# Patient Record
Sex: Female | Born: 1935 | Race: White | Hispanic: No | Marital: Married | Smoking: Never smoker
Health system: Southern US, Community
[De-identification: ages and names within clinical notes are randomized; demographics above are authoritative.]

## PROBLEM LIST (undated history)

## (undated) DIAGNOSIS — M1990 Unspecified osteoarthritis, unspecified site: Secondary | ICD-10-CM

## (undated) DIAGNOSIS — Z95828 Presence of other vascular implants and grafts: Secondary | ICD-10-CM

## (undated) DIAGNOSIS — I10 Essential (primary) hypertension: Secondary | ICD-10-CM

## (undated) DIAGNOSIS — G8250 Quadriplegia, unspecified: Secondary | ICD-10-CM

## (undated) DIAGNOSIS — E43 Unspecified severe protein-calorie malnutrition: Secondary | ICD-10-CM

## (undated) DIAGNOSIS — S14126A Central cord syndrome at C6 level of cervical spinal cord, initial encounter: Secondary | ICD-10-CM

## (undated) DIAGNOSIS — D62 Acute posthemorrhagic anemia: Secondary | ICD-10-CM

## (undated) DIAGNOSIS — C801 Malignant (primary) neoplasm, unspecified: Secondary | ICD-10-CM

## (undated) HISTORY — DX: Unspecified severe protein-calorie malnutrition: E43

## (undated) HISTORY — DX: Central cord syndrome at C6 level of cervical spinal cord, initial encounter: S14.126A

## (undated) HISTORY — PX: CATARACT EXTRACTION: SUR2

## (undated) HISTORY — DX: Acute posthemorrhagic anemia: D62

## (undated) HISTORY — DX: Quadriplegia, unspecified: G82.50

## (undated) HISTORY — DX: Presence of other vascular implants and grafts: Z95.828

---

## 2012-09-12 ENCOUNTER — Ambulatory Visit: Payer: Self-pay | Admitting: Ophthalmology

## 2012-10-17 ENCOUNTER — Ambulatory Visit: Payer: Self-pay | Admitting: Ophthalmology

## 2013-12-31 DIAGNOSIS — I10 Essential (primary) hypertension: Secondary | ICD-10-CM

## 2014-02-04 ENCOUNTER — Ambulatory Visit: Payer: Self-pay | Admitting: Internal Medicine

## 2014-04-08 DIAGNOSIS — C4492 Squamous cell carcinoma of skin, unspecified: Secondary | ICD-10-CM

## 2014-04-08 HISTORY — DX: Squamous cell carcinoma of skin, unspecified: C44.92

## 2015-02-13 ENCOUNTER — Other Ambulatory Visit: Payer: Self-pay | Admitting: Internal Medicine

## 2015-02-13 DIAGNOSIS — Z1239 Encounter for other screening for malignant neoplasm of breast: Secondary | ICD-10-CM

## 2015-02-24 ENCOUNTER — Ambulatory Visit: Payer: Self-pay

## 2015-05-06 ENCOUNTER — Ambulatory Visit: Payer: Self-pay

## 2015-06-02 ENCOUNTER — Ambulatory Visit: Payer: Self-pay

## 2015-06-18 ENCOUNTER — Ambulatory Visit
Admission: RE | Admit: 2015-06-18 | Discharge: 2015-06-18 | Disposition: A | Discharge status: Home / Self Care | Payer: Managed Care, Other (non HMO) | Source: Ambulatory Visit | Attending: Internal Medicine | Admitting: Internal Medicine

## 2015-06-18 DIAGNOSIS — Z1239 Encounter for other screening for malignant neoplasm of breast: Secondary | ICD-10-CM

## 2015-06-18 DIAGNOSIS — Z1231 Encounter for screening mammogram for malignant neoplasm of breast: Secondary | ICD-10-CM | POA: Insufficient documentation

## 2015-06-18 HISTORY — DX: Malignant (primary) neoplasm, unspecified: C80.1

## 2015-07-02 ENCOUNTER — Encounter: Payer: Self-pay | Admitting: Emergency Medicine

## 2015-07-02 ENCOUNTER — Emergency Department
Admission: EM | Admit: 2015-07-02 | Discharge: 2015-07-02 | Disposition: A | Discharge status: Home / Self Care | Payer: Managed Care, Other (non HMO) | Attending: Emergency Medicine | Admitting: Emergency Medicine

## 2015-07-02 DIAGNOSIS — T783XXA Angioneurotic edema, initial encounter: Secondary | ICD-10-CM | POA: Diagnosis not present

## 2015-07-02 DIAGNOSIS — I10 Essential (primary) hypertension: Secondary | ICD-10-CM | POA: Insufficient documentation

## 2015-07-02 DIAGNOSIS — Z85828 Personal history of other malignant neoplasm of skin: Secondary | ICD-10-CM | POA: Diagnosis not present

## 2015-07-02 DIAGNOSIS — R220 Localized swelling, mass and lump, head: Secondary | ICD-10-CM | POA: Diagnosis present

## 2015-07-02 HISTORY — DX: Essential (primary) hypertension: I10

## 2015-07-02 MED ORDER — PREDNISONE 20 MG PO TABS
40.0000 mg | ORAL_TABLET | Freq: Once | ORAL | Status: AC
  Administered 2015-07-02: 40 mg via ORAL
  Filled 2015-07-02: qty 2

## 2015-07-02 MED ORDER — DIPHENHYDRAMINE HCL 25 MG PO CAPS
25.0000 mg | ORAL_CAPSULE | Freq: Once | ORAL | Status: AC
  Administered 2015-07-02: 25 mg via ORAL
  Filled 2015-07-02: qty 1

## 2015-07-02 MED ORDER — PREDNISONE 20 MG PO TABS
40.0000 mg | ORAL_TABLET | Freq: Every day | ORAL | Status: DC
Start: 2015-07-02 — End: 2018-01-19

## 2015-07-02 NOTE — ED Notes (Signed)
Pt states she noticed she was talking funny this afternoon and looked in the mirror and the left side of her tongue was swelling. Pt denies SOB or difficulty swallowing. Pt denies eating or taking any new medication.

## 2015-07-02 NOTE — Discharge Instructions (Signed)
Please discontinue use of your lisinopril/hydrochlorothiazide. Please follow-up with your doctor soon as possible to discuss further blood pressure management. Return to the emergency department for any worsening swelling, any trouble swallowing, or any trouble breathing.   Angioedema Angioedema is a sudden swelling of tissues, often of the skin. It can occur on the face or genitals or in the abdomen or other body parts. The swelling usually develops over a short period and gets better in 24 to 48 hours. It often begins during the night and is found when the person wakes up. The person may also get red, itchy patches of skin (hives). Angioedema can be dangerous if it involves swelling of the air passages.  Depending on the cause, episodes of angioedema may only happen once, come back in unpredictable patterns, or repeat for several years and then gradually fade away.  CAUSES  Angioedema can be caused by an allergic reaction to various triggers. It can also result from nonallergic causes, including reactions to drugs, immune system disorders, viral infections, or an abnormal gene that is passed to you from your parents (hereditary). For some people with angioedema, the cause is unknown.  Some things that can trigger angioedema include:   Foods.   Medicines, such as ACE inhibitors, ARBs, nonsteroidal anti-inflammatory agents, or estrogen.   Latex.   Animal saliva.   Insect stings.   Dyes used in X-rays.   Mild injury.   Dental work.  Surgery.  Stress.   Sudden changes in temperature.   Exercise. SIGNS AND SYMPTOMS   Swelling of the skin.  Hives. If these are present, there is also intense itching.  Redness in the affected area.   Pain in the affected area.  Swollen lips or tongue.  Breathing problems. This may happen if the air passages swell.  Wheezing. If internal organs are involved, there may be:   Nausea.   Abdominal pain.   Vomiting.    Difficulty swallowing.   Difficulty passing urine. DIAGNOSIS   Your health care provider will examine the affected area and take a medical and family history.  Various tests may be done to help determine the cause. Tests may include:  Allergy skin tests to see if the problem is an allergic reaction.   Blood tests to check for hereditary angioedema.   Tests to check for underlying diseases that could cause the condition.   A review of your medicines, including over-the-counter medicines, may be done. TREATMENT  Treatment will depend on the cause of the angioedema. Possible treatments include:   Removal of anything that triggered the condition (such as stopping certain medicines).   Medicines to treat symptoms or prevent attacks. Medicines given may include:   Antihistamines.   Epinephrine injection.   Steroids.   Hospitalization may be required for severe attacks. If the air passages are affected, it can be an emergency. Tubes may need to be placed to keep the airway open. HOME CARE INSTRUCTIONS   Take all medicines as directed by your health care provider.  If you were given medicines for emergency allergy treatment, always carry them with you.  Wear a medical bracelet as directed by your health care provider.   Avoid known triggers. SEEK MEDICAL CARE IF:   You have repeat attacks of angioedema.   Your attacks are more frequent or more severe despite preventive measures.   You have hereditary angioedema and are considering having children. It is important to discuss with your health care provider the risks of passing the  condition on to your children. SEEK IMMEDIATE MEDICAL CARE IF:   You have severe swelling of the mouth, tongue, or lips.  You have difficulty breathing.   You have difficulty swallowing.   You faint. MAKE SURE YOU:  Understand these instructions.  Will watch your condition.  Will get help right away if you are not doing  well or get worse.   This information is not intended to replace advice given to you by your health care provider. Make sure you discuss any questions you have with your health care provider.   Document Released: 03/29/2001 Document Revised: 02/08/2014 Document Reviewed: 09/11/2012 Elsevier Interactive Patient Education [employer].

## 2015-07-02 NOTE — ED Provider Notes (Signed)
[facility] Emergency Department Provider Note  Time seen: 7:36 PM  I have reviewed the triage vital signs and the nursing notes.   HISTORY  Chief Complaint Oral Swelling    HPI Sherry [name] is a 81 y.o. female with a past medical history of hypertension, presents the emergency department with swelling of the left side of her tongue. According to the patient she noticed this around 4 PM. Denies any itching or rash. Denies any trouble breathing or swallowing. Denies any history of the same. The patient does take lisinopril/hydrochlorothiazide and has been on this pill for approximately 1 year. Patient states the swelling appears to be improving.     Past Medical History  Diagnosis Date  . Cancer ([place])     skin  . Hypertension     There are no active problems to display for this patient.   Past Surgical History  Procedure Laterality Date  . Cataract extraction      No current outpatient prescriptions on file.  Allergies Review of patient's allergies indicates no known allergies.  Family History  Problem Relation Age of Onset  . Breast cancer Maternal Aunt 75    Social History Social History  Substance Use Topics  . Smoking status: Never Smoker   . Smokeless tobacco: None  . Alcohol Use: No    Review of Systems Constitutional: Negative for fever. ENT: Left tongue swelling Cardiovascular: Negative for chest pain. Respiratory: Negative for shortness of breath. Gastrointestinal: Negative for abdominal pain Musculoskeletal: Negative for back pain. Neurological: Negative for headache 10-point ROS otherwise negative.  ____________________________________________   PHYSICAL EXAM:  VITAL SIGNS: ED Triage Vitals  Enc Vitals Group     BP 07/02/15 1815 164/92 mmHg     Pulse Rate 07/02/15 1815 83     Resp 07/02/15 1815 18     Temp 07/02/15 1815 97.9 F (36.6 C)     Temp Source 07/02/15 1815 Oral     SpO2 07/02/15 1815 98 %   Weight 07/02/15 1815 170 lb (77.111 kg)     Height 07/02/15 1815 5\' 8"  (1.727 m)     Head Cir --      Peak Flow --      Pain Score --      Pain Loc --      Pain Edu? --      Excl. in [place]? --     Constitutional: Alert and oriented. Well appearing and in no distress. Eyes: Normal exam ENT   Head: Normocephalic and atraumatic.   Mouth/Throat: Mucous membranes are moist.Patient doesn't mild left-sided tongue edema. No lip edema. Pharynx is normal. No airway occlusion. No stridor. Cardiovascular: Normal rate, regular rhythm. No murmur Respiratory: Normal respiratory effort without tachypnea nor retractions. Breath sounds are clear  Gastrointestinal: Soft and nontender. No distention.  Musculoskeletal: Nontender with normal range of motion in all extremities.  Neurologic:  Normal speech and language. No gross focal neurologic deficits Skin:  Skin is warm, dry and intact.  Psychiatric: Mood and affect are normal.   ____________________________________________    INITIAL IMPRESSION / ASSESSMENT AND PLAN / ED COURSE  Pertinent labs & imaging results that were available during my care of the patient were reviewed by me and considered in my medical decision making (see chart for details).  Patient presents with mild left tongue swelling since 4 PM today. Exam very consistent with angioedema likely related to lisinopril. Patient believes her symptoms are improving. We'll dose oral Benadryl and prednisone  and continue to monitor in the emergency department. If symptoms improve we'll discharge home with prednisone. I discussed with the patient the need to discontinue use of lisinopril and see her primary care doctor this week. Patient agreeable plan.  Patient's symptoms appears largely unchanged, the patient feels like they have improved, but they've definite not worsened. We'll discharge the patient with prednisone, primary care follow-up. I have again reiterated that the patient needs to  discontinue use of lisinopril, she is agreeable.  ____________________________________________   FINAL CLINICAL IMPRESSION(S) / ED DIAGNOSES  Angioedema   Harvest Dark, MD 07/02/15 2029

## 2015-12-23 ENCOUNTER — Encounter: Payer: Self-pay | Admitting: Emergency Medicine

## 2015-12-23 ENCOUNTER — Emergency Department
Admission: EM | Admit: 2015-12-23 | Discharge: 2015-12-23 | Disposition: A | Discharge status: Home / Self Care | Payer: Managed Care, Other (non HMO) | Attending: Emergency Medicine | Admitting: Emergency Medicine

## 2015-12-23 DIAGNOSIS — T7840XA Allergy, unspecified, initial encounter: Secondary | ICD-10-CM | POA: Diagnosis not present

## 2015-12-23 DIAGNOSIS — I10 Essential (primary) hypertension: Secondary | ICD-10-CM | POA: Diagnosis not present

## 2015-12-23 DIAGNOSIS — Z85828 Personal history of other malignant neoplasm of skin: Secondary | ICD-10-CM | POA: Insufficient documentation

## 2015-12-23 DIAGNOSIS — R220 Localized swelling, mass and lump, head: Secondary | ICD-10-CM | POA: Diagnosis present

## 2015-12-23 MED ORDER — DIPHENHYDRAMINE HCL 50 MG/ML IJ SOLN
50.0000 mg | Freq: Once | INTRAMUSCULAR | Status: AC
  Administered 2015-12-23: 50 mg via INTRAMUSCULAR
  Filled 2015-12-23: qty 1

## 2015-12-23 NOTE — Discharge Instructions (Signed)
Please return immediately if condition worsens. Please contact her primary physician or the physician you were given for referral. If you have any specialist physicians involved in her treatment and plan please also contact them. Thank you for using [facility].  Please take Benadryl 50 mg every 6 hours. Return emergency department especially for increased facial swelling, trouble with speech, shortness of breath, etc.

## 2015-12-23 NOTE — ED Provider Notes (Signed)
Time Seen: Approximately 1347 I have reviewed the triage notes  Chief Complaint: Allergic Reaction   History of Present Illness: Sherry [name] is a 81 y.o. female *who presents with some right-sided lower facial swelling. Patient states she's had a history of focal allergic reactions which were thought to be secondary to ACE inhibitor blood pressure pill. She states her she had some tongue swelling and again she was taken off the medication and then she developed some other facial swelling which seemed to resolve on its own. Patient seen an allergist along with her primary physician with no obvious source of allergies. Patient states she was at work today and noticed some increasing right-sided cheek swelling. She denies any trouble with speech or swallowing. She states she had an EpiPen with her and gave herself a shot at work and now feels symptomatically improved. She denies any shortness of breath or chest tightness. She denies any unusual rashes or lesions   Past Medical History:  Diagnosis Date  . Cancer ([place])    skin  . Hypertension     There are no active problems to display for this patient.   Past Surgical History:  Procedure Laterality Date  . CATARACT EXTRACTION      Past Surgical History:  Procedure Laterality Date  . CATARACT EXTRACTION      Current Outpatient Rx  . Order #: [id number] Class: Print    Allergies:  Lisinopril-hydrochlorothiazide  Family History: Family History  Problem Relation Age of Onset  . Breast cancer Maternal Aunt 75    Social History: Social History  Substance Use Topics  . Smoking status: Never Smoker  . Smokeless tobacco: Never Used  . Alcohol use No     Review of Systems:   10 point review of systems was performed and was otherwise negative:  Constitutional: No fever Eyes: No visual disturbances ENT: No sore throat, ear pain Cardiac: No chest pain Respiratory: No shortness of breath, wheezing, or stridor Abdomen:  No abdominal pain, no vomiting, No diarrhea Endocrine: No weight loss, No night sweats Extremities: No peripheral edema, cyanosis Skin: No rashes, easy bruising Neurologic: No focal weakness, trouble with speech or swollowing Urologic: No dysuria, Hematuria, or urinary frequency Swelling exclusively located in the right lower face region*  Physical Exam:  ED Triage Vitals  Enc Vitals Group     BP 12/23/15 1301 (!) 188/87     Pulse Rate 12/23/15 1301 98     Resp 12/23/15 1301 20     Temp 12/23/15 1301 97.9 F (36.6 C)     Temp Source 12/23/15 1301 Oral     SpO2 12/23/15 1301 100 %     Weight 12/23/15 1302 167 lb (75.8 kg)     Height 12/23/15 1302 5\' 8"  (1.727 m)     Head Circumference --      Peak Flow --      Pain Score --      Pain Loc --      Pain Edu? --      Excl. in [place]? --     General: Awake , Alert , and Oriented times 3; GCS 15 Head: Normal cephalic , atraumatic Patient does have some edema right angle of the jaw without any intraoral lesions. No tender dentition. No induration of the face. No swelling over the parotid gland. Eyes: Pupils equal , round, reactive to light Nose/Throat: No nasal drainage, patent upper airway without erythema or exudate.  Neck: Supple, Full range of motion, No  anterior adenopathy or palpable thyroid masses Lungs: Clear to ascultation without wheezes , rhonchi, or rales Heart: Regular rate, regular rhythm without murmurs , gallops , or rubs Abdomen: Soft, non tender without rebound, guarding , or rigidity; bowel sounds positive and symmetric in all 4 quadrants. No organomegaly .        Extremities: 2 plus symmetric pulses. No edema, clubbing or cyanosis Neurologic: normal ambulation, Motor symmetric without deficits, sensory intact Skin: warm, dry, no rashes    ED Course: * The patient continued to improve and was given 50 mg of IM Benadryl here in emergency department. I held off on steroids and she was already feeling much improved  and will continue with antihistamine therapy. She states she does have an EpiPen available.. Unusual allergic reaction that couldn't find any intraoral lesions or other explanations for right-sided facial swelling. She states her is some mild pruritus associated with it. The patient was advised to return here if she develops a rash, shortness of breath, chest tightness, etc. Clinical Course      Assessment: * Allergic reaction   Final Clinical Impression: *  Final diagnoses:  Allergic reaction, initial encounter     Plan:  Outpatient Patient was advised to return immediately if condition worsens. Patient was advised to follow up with their primary care physician or other specialized physicians involved in their outpatient care. The patient and/or family member/power of attorney had laboratory results reviewed at the bedside. All questions and concerns were addressed and appropriate discharge instructions were distributed by the nursing staff.             Daymon Larsen, MD 12/23/15 [phone]

## 2015-12-23 NOTE — ED Triage Notes (Signed)
Pt presents with right side facial swelling noticed at 1230 today. Pt states is has just started a new dose of bp med. Pt did use her EPI Pen prior to arrival. Pt with no acute respiratory distress noted in triage, vss.

## 2016-11-25 DIAGNOSIS — Z91018 Allergy to other foods: Secondary | ICD-10-CM | POA: Insufficient documentation

## 2017-11-28 ENCOUNTER — Emergency Department (HOSPITAL_COMMUNITY): Payer: Medicare HMO

## 2017-11-28 ENCOUNTER — Other Ambulatory Visit: Payer: Self-pay

## 2017-11-28 ENCOUNTER — Encounter (HOSPITAL_COMMUNITY): Payer: Self-pay | Admitting: *Deleted

## 2017-11-28 ENCOUNTER — Inpatient Hospital Stay (HOSPITAL_COMMUNITY)
Admission: EM | Admit: 2017-11-28 | Discharge: 2017-12-07 | DRG: 028 | Disposition: A | Discharge status: Rehabilitation Facility | Payer: Medicare HMO | Attending: Neurological Surgery | Admitting: Neurological Surgery

## 2017-11-28 DIAGNOSIS — S14126A Central cord syndrome at C6 level of cervical spinal cord, initial encounter: Secondary | ICD-10-CM | POA: Diagnosis present

## 2017-11-28 DIAGNOSIS — R413 Other amnesia: Secondary | ICD-10-CM | POA: Diagnosis present

## 2017-11-28 DIAGNOSIS — W1830XA Fall on same level, unspecified, initial encounter: Secondary | ICD-10-CM | POA: Diagnosis present

## 2017-11-28 DIAGNOSIS — M4802 Spinal stenosis, cervical region: Secondary | ICD-10-CM | POA: Diagnosis present

## 2017-11-28 DIAGNOSIS — M792 Neuralgia and neuritis, unspecified: Secondary | ICD-10-CM

## 2017-11-28 DIAGNOSIS — S0181XA Laceration without foreign body of other part of head, initial encounter: Secondary | ICD-10-CM | POA: Diagnosis present

## 2017-11-28 DIAGNOSIS — S01511A Laceration without foreign body of lip, initial encounter: Secondary | ICD-10-CM | POA: Diagnosis present

## 2017-11-28 DIAGNOSIS — Z91018 Allergy to other foods: Secondary | ICD-10-CM

## 2017-11-28 DIAGNOSIS — Z419 Encounter for procedure for purposes other than remedying health state, unspecified: Secondary | ICD-10-CM

## 2017-11-28 DIAGNOSIS — D62 Acute posthemorrhagic anemia: Secondary | ICD-10-CM

## 2017-11-28 DIAGNOSIS — M1990 Unspecified osteoarthritis, unspecified site: Secondary | ICD-10-CM | POA: Diagnosis present

## 2017-11-28 DIAGNOSIS — S14127A Central cord syndrome at C7 level of cervical spinal cord, initial encounter: Secondary | ICD-10-CM | POA: Diagnosis not present

## 2017-11-28 DIAGNOSIS — Z79899 Other long term (current) drug therapy: Secondary | ICD-10-CM

## 2017-11-28 DIAGNOSIS — R1310 Dysphagia, unspecified: Secondary | ICD-10-CM | POA: Diagnosis not present

## 2017-11-28 DIAGNOSIS — Z888 Allergy status to other drugs, medicaments and biological substances status: Secondary | ICD-10-CM

## 2017-11-28 DIAGNOSIS — N319 Neuromuscular dysfunction of bladder, unspecified: Secondary | ICD-10-CM

## 2017-11-28 DIAGNOSIS — I10 Essential (primary) hypertension: Secondary | ICD-10-CM

## 2017-11-28 DIAGNOSIS — Z23 Encounter for immunization: Secondary | ICD-10-CM

## 2017-11-28 DIAGNOSIS — S22089A Unspecified fracture of T11-T12 vertebra, initial encounter for closed fracture: Secondary | ICD-10-CM | POA: Diagnosis present

## 2017-11-28 DIAGNOSIS — Z791 Long term (current) use of non-steroidal anti-inflammatories (NSAID): Secondary | ICD-10-CM

## 2017-11-28 DIAGNOSIS — T1490XA Injury, unspecified, initial encounter: Secondary | ICD-10-CM

## 2017-11-28 DIAGNOSIS — M479 Spondylosis, unspecified: Secondary | ICD-10-CM | POA: Diagnosis present

## 2017-11-28 DIAGNOSIS — M2578 Osteophyte, vertebrae: Secondary | ICD-10-CM | POA: Diagnosis present

## 2017-11-28 DIAGNOSIS — S50811A Abrasion of right forearm, initial encounter: Secondary | ICD-10-CM | POA: Diagnosis present

## 2017-11-28 DIAGNOSIS — K592 Neurogenic bowel, not elsewhere classified: Secondary | ICD-10-CM

## 2017-11-28 DIAGNOSIS — S51012A Laceration without foreign body of left elbow, initial encounter: Secondary | ICD-10-CM | POA: Diagnosis present

## 2017-11-28 DIAGNOSIS — R339 Retention of urine, unspecified: Secondary | ICD-10-CM | POA: Diagnosis not present

## 2017-11-28 DIAGNOSIS — S14129A Central cord syndrome at unspecified level of cervical spinal cord, initial encounter: Secondary | ICD-10-CM | POA: Diagnosis present

## 2017-11-28 DIAGNOSIS — S22009A Unspecified fracture of unspecified thoracic vertebra, initial encounter for closed fracture: Secondary | ICD-10-CM

## 2017-11-28 DIAGNOSIS — G8250 Quadriplegia, unspecified: Secondary | ICD-10-CM

## 2017-11-28 DIAGNOSIS — M810 Age-related osteoporosis without current pathological fracture: Secondary | ICD-10-CM | POA: Diagnosis present

## 2017-11-28 HISTORY — DX: Unspecified osteoarthritis, unspecified site: M19.90

## 2017-11-28 LAB — COMPREHENSIVE METABOLIC PANEL
ALBUMIN: 3.5 g/dL (ref 3.5–5.0)
ALT: 19 U/L (ref 0–44)
AST: 27 U/L (ref 15–41)
Alkaline Phosphatase: 39 U/L (ref 38–126)
Anion gap: 6 (ref 5–15)
BUN: 21 mg/dL (ref 8–23)
CO2: 24 mmol/L (ref 22–32)
Calcium: 8.7 mg/dL — ABNORMAL LOW (ref 8.9–10.3)
Chloride: 108 mmol/L (ref 98–111)
Creatinine, Ser: 0.88 mg/dL (ref 0.44–1.00)
GFR calc Af Amer: >60 mL/min (ref >60)
GFR, EST NON AFRICAN AMERICAN: 60 mL/min — AB (ref >60)
GLUCOSE: 138 mg/dL — AB (ref 70–99)
POTASSIUM: 3.8 mmol/L (ref 3.5–5.1)
Sodium: 138 mmol/L (ref 135–145)
Total Bilirubin: 0.7 mg/dL (ref 0.3–1.2)
Total Protein: 5.6 g/dL — ABNORMAL LOW (ref 6.5–8.1)

## 2017-11-28 LAB — CBC
HCT: 38.5 % (ref 36.0–46.0)
Hemoglobin: 12 g/dL (ref 12.0–15.0)
MCH: 28.4 pg (ref 26.0–34.0)
MCHC: 31.2 g/dL (ref 30.0–36.0)
MCV: 91 fL (ref 80.0–100.0)
Platelets: 232 10*3/uL (ref 150–400)
RBC: 4.23 MIL/uL (ref 3.87–5.11)
RDW: 14.1 % (ref 11.5–15.5)
WBC: 8.9 10*3/uL (ref 4.0–10.5)
nRBC: 0 % (ref 0.0–0.2)

## 2017-11-28 LAB — I-STAT CHEM 8, ED
BUN: 22 mg/dL (ref 8–23)
CHLORIDE: 104 mmol/L (ref 98–111)
CREATININE: 0.9 mg/dL (ref 0.44–1.00)
Calcium, Ion: 1.12 mmol/L — ABNORMAL LOW (ref 1.15–1.40)
Glucose, Bld: 134 mg/dL — ABNORMAL HIGH (ref 70–99)
HEMATOCRIT: 37 % (ref 36.0–46.0)
Hemoglobin: 12.6 g/dL (ref 12.0–15.0)
Potassium: 3.8 mmol/L (ref 3.5–5.1)
Sodium: 140 mmol/L (ref 135–145)
TCO2: 28 mmol/L (ref 22–32)

## 2017-11-28 LAB — PROTIME-INR
INR: 0.98
Prothrombin Time: 12.9 seconds (ref 11.4–15.2)

## 2017-11-28 LAB — CDS SEROLOGY

## 2017-11-28 LAB — I-STAT CG4 LACTIC ACID, ED: LACTIC ACID, VENOUS: 1.38 mmol/L (ref 0.5–1.9)

## 2017-11-28 LAB — SAMPLE TO BLOOD BANK

## 2017-11-28 LAB — ETHANOL

## 2017-11-28 MED ORDER — IOHEXOL 300 MG/ML  SOLN
100.0000 mL | Freq: Once | INTRAMUSCULAR | Status: AC | PRN
  Administered 2017-11-28: 100 mL via INTRAVENOUS

## 2017-11-28 MED ORDER — LIDOCAINE HCL (PF) 1 % IJ SOLN
30.0000 mL | Freq: Once | INTRAMUSCULAR | Status: AC
  Administered 2017-11-28: 30 mL
  Filled 2017-11-28: qty 30

## 2017-11-28 MED ORDER — TETANUS-DIPHTH-ACELL PERTUSSIS 5-2.5-18.5 LF-MCG/0.5 IM SUSP
0.5000 mL | Freq: Once | INTRAMUSCULAR | Status: AC
  Administered 2017-11-29: 0.5 mL via INTRAMUSCULAR
  Filled 2017-11-28: qty 0.5

## 2017-11-28 NOTE — ED Notes (Signed)
Pt taken to CT.

## 2017-11-28 NOTE — Progress Notes (Signed)
   11/28/17 2100  Clinical Encounter Type  Visited With Health care provider  Visit Type ED;Trauma  Referral From Nurse  [facility] reported for Level 2 Trauma; Family on their way per EMS; [facility] not currently required; Available as needed

## 2017-11-28 NOTE — ED Triage Notes (Signed)
Pt was trying to get her cat inside when she somehow fell. Doesn't recall events, unsure of LOC. Per EMS, appeared that pt fell into a fence then onto ground. A&Ox3 on arrival. Lac to L elbow and R side of chin and upper lip, abrasion to R elbow. Pt has decreased sensation mid chest and below; no movement, sensation or response to painful stimuli to BLE

## 2017-11-28 NOTE — Consult Note (Signed)
Neurosurgery Consultation  Reason for Consult: Spinal cord injury Referring Physician: Rozier  CC: Weakness  HPI: This is a 83 y.o. woman s/p fall with immediate quadriplegia. She has since slowly regained strength in all four extremities, but has no proprioception in the BLE so she was unaware that she has LE function. She does endorse parasthesias diffusely in BUE but no dysesthesias yet. She is amnestic to the event and force vector for her fall, likely fall ventral contact point and cervical extension.    ROS: A 14 point ROS was performed and is negative except as noted in the HPI.   PMHx:  Past Medical History:  Diagnosis Date  . Arthritis   . Hypertension    FamHx: No family history on file. SocHx:  reports that she has never smoked. She has never used smokeless tobacco. She reports that she does not drink alcohol or use drugs.  Exam: Vital signs in last 24 hours: Temp:  [97.6 F (36.4 C)] 97.6 F (36.4 C) (10/28 2136) Pulse Rate:  [64] 64 (10/28 2136) Resp:  [16] 16 (10/28 2136) BP: (106)/(68) 106/68 (10/28 2136) SpO2:  [90 %-98 %] 98 % (10/28 2157) Weight:  [78.9 kg] 78.9 kg (10/28 2153) General: Awake, alert, cooperative, lying in bed in NAD Head: scattered small lacerations, normocephalic HEENT: wearing rigid cervical collar Pulmonary: breathing room air comfortably, no evidence of increased work of breathing Cardiac: RRR Abdomen: S NT ND Extremities: warm and well perfused x4 Neuro: AOx3, PERRL, EOMI, FS Strength: RUE: D 4/5, B 4/5, WE 3/5, T 2/5, G 1/5 LUE: D 4/5, B 4/5, WE 3/5, T 2/5, G 1/5 RLE: HF 4-/5, KE 4-/5, DF 4-/5, EHL 4-/5, PF 4-/5 LLE: HF 4-/5, KE 4-/5, DF 4-/5, EHL 4-/5, PF 4-/5 Mid-thoracic sensory level with complete loss of proprioception and temperature / fine touch    Assessment and Plan: 83 y.o. woman s/p fall with immediate quadriplegia followed by gradual improvement. CT C-spine personally reviewed, which shows spondylosis but no obvious  fracture or dislocation, retro-odontoid pannus, . Clinical exam and history consistent with central cord syndrome. CT T-spine shows complex fracture at T11, likely acute but part of the margins are sclerotic -discussed above with pt as well as next steps in plan of care -MRI C-spine and T-spine w/o contrast -maintain C/T/L spine precautions until T-spine fracture has been evaluated by MRI -if no other injuries, admit to my service on 4NP -keep NPO overnight, plan of care pending MRI, likely operative treatment 81/30/19  Judith Part, MD 11/28/17 11:43 PM [facility]

## 2017-11-28 NOTE — ED Notes (Signed)
Daughter [name] [phone]

## 2017-11-28 NOTE — ED Provider Notes (Signed)
[facility] EMERGENCY DEPARTMENT Provider Note   CSN: [id number] Arrival date & time: 11/28/17  2128     History   Chief Complaint Chief Complaint  Patient presents with  . Fall    HPI Sherry [name] is a 83 y.o. [name].  HPI Patient is an 83 year old [name] who presents to the emergency department following a fall.  EMS report that patient was trying to chase down her cat which time she fell forward hitting against a wooden fence.  Secondary to that fall patient suffered a laceration to her left elbow, a right forearm abrasion, a small chin wound, the patient also reports that she has no sensation from approximately the level of the mid chest down.  Secondary to patient's loss of sensation she was made a level 2 trauma.  Upon arrival patient is alert and answering questions.  She is unable to specify if she lost consciousness but does not remember all of the events following the fall.  EMS reports that she did not have any purposeful movements in her bilateral lower extremities in route but would intermittently move her right lower extremity more than left without meaning to do so.  Upon arrival patient is alert and oriented x4 and protecting her airway.  She complains of headache, lower back pain, and bilateral arm pain.  She endorses that she has diminished sensation in her hands, no sensation below the level of her mid chest, and weakness in all of her extremities.  Past Medical History:  Diagnosis Date  . Arthritis   . Hypertension     Patient Active Problem List   Diagnosis Date Noted  . Central cord syndrome at C6 level of cervical spinal cord ([place]) 11/29/2017  . Central cord syndrome ([place]) 11/29/2017    History reviewed. No pertinent surgical history.   OB History   None      Home Medications    Prior to Admission medications   Medication Sig Start Date End Date Taking? Authorizing Provider  amLODipine (NORVASC) 10 MG tablet Take 10 mg by mouth  daily after supper.   Yes [provider]  Ascorbic Acid (VITAMIN C PO) Take 1 tablet by mouth daily.   Yes [provider]  b complex vitamins tablet Take 1 tablet by mouth daily.   Yes [provider]  Cholecalciferol (VITAMIN D PO) Take 1 tablet by mouth daily.    Yes [provider]  metronidazole (NORITATE) 1 % cream Apply 1 application topically daily as needed (rosacea).   Yes [provider]  Multiple Vitamin (MULTIVITAMIN WITH MINERALS) TABS tablet Take 1 tablet by mouth daily.   Yes [provider]  naproxen sodium (ALEVE) 220 MG tablet Take 220 mg by mouth 2 (two) times daily as needed (pain/headache).   Yes [provider]  triamcinolone cream (KENALOG) 0.1 % Apply 1 application topically daily as needed (eczema).   Yes [provider]    Family History No family history on file.  Social History Social History   Tobacco Use  . Smoking status: Never Smoker  . Smokeless tobacco: Never Used  Substance Use Topics  . Alcohol use: Never    Frequency: Never  . Drug use: Never     Allergies   Beef-derived products and Lisinopril-hydrochlorothiazide   Review of Systems Review of Systems  Constitutional: Negative for chills and fever.  HENT: Negative for ear pain and sore throat.   Eyes: Negative for pain and visual disturbance.  Respiratory:  Negative for cough and shortness of breath.   Cardiovascular: Negative for chest pain and palpitations.  Gastrointestinal: Negative for abdominal pain and vomiting.  Genitourinary: Negative for dysuria and hematuria.  Musculoskeletal: Negative for arthralgias and back pain.  Skin: Negative for color change and rash.  Neurological: Negative for seizures and syncope.  All other systems reviewed and are negative.    Physical Exam Updated Vital Signs BP Sherry/63 (BP Location: Left Arm)   Pulse 76   Temp 98.4 F (36.9 C) (Oral)   Resp 20   Ht 5\' 7"  (1.702 m)    Wt 78.9 kg   SpO2 94%   BMI 27.25 kg/m   Physical Exam  Constitutional: She is oriented to person, place, and time. She appears well-developed and well-nourished.  HENT:  Head: Normocephalic.  Very small lacerations present on patient's upper lip and the right side of her chin.  Eyes: Pupils are equal, round, and reactive to light. Conjunctivae and EOM are normal.  Neck: Neck supple.  Cervical collar in place.  Cardiovascular: Normal rate and regular rhythm.  Pulmonary/Chest: Effort normal and breath sounds normal. No respiratory distress.  Abdominal: Soft. She exhibits no distension and no mass. There is no tenderness.  Musculoskeletal:  Patient with laceration approximately 7 cm in length present on her left elbow.  It is hemostatic at the time of arrival to the emergency department.  Patient also has a small skin tear on her right forearm.  No other traumatic injuries appreciated on her extremities.  She does have tenderness to palpation over her [name] starting in the mid back down to her sacrum.  Neurological: She is alert and oriented to person, place, and time.  Patient with diminished sensation in her bilateral hands, bilateral lower extremities, and over her abdomen.  Patient does report that she has sensation to the level of the sacrum on her back but does not feel anything below the buttocks.  On exam patient will triple flex her right lower extremity to pain but will not purposefully move her bilateral lower extremities on initial exam.  She also has weakness on grip strength in her bilateral hands.  She has normal rectal tone.  Skin: Skin is warm and dry.  Psychiatric: She has a normal mood and affect.  Nursing note and vitals reviewed.    ED Treatments / Results  Labs (all labs ordered are listed, but only abnormal results are displayed) Labs Reviewed  COMPREHENSIVE METABOLIC PANEL - Abnormal; Notable for the following components:      Result Value   Glucose, Bld 138  (*)    Calcium 8.7 (*)    Total Protein 5.6 (*)    GFR calc non Af Amer 60 (*)    All other components within normal limits  I-STAT CHEM 8, ED - Abnormal; Notable for the following components:   Glucose, Bld 134 (*)    Calcium, Ion 1.12 (*)    All other components within normal limits  SURGICAL PCR SCREEN  CDS SEROLOGY  CBC  ETHANOL  PROTIME-INR  URINALYSIS, ROUTINE W REFLEX MICROSCOPIC  URINALYSIS, ROUTINE W REFLEX MICROSCOPIC  I-STAT CG4 LACTIC ACID, ED  SAMPLE TO BLOOD BANK    EKG None  Radiology Dg Elbow Complete Left  Result Date: 11/28/2017 CLINICAL DATA:  Fall from porch with laceration left elbow. EXAM: LEFT ELBOW - COMPLETE 3+ VIEW COMPARISON:  None. FINDINGS: There is no evidence of fracture, dislocation, or joint effusion. There is no evidence of arthropathy or other  focal bone abnormality. Soft tissues are unremarkable. IMPRESSION: Negative. Electronically Signed   By: Marin Olp M.D.   On: 11/28/2017 22:11   Ct Head Wo Contrast  Result Date: 11/28/2017 CLINICAL DATA:  Head trauma after fall. EXAM: CT HEAD WITHOUT CONTRAST CT CERVICAL [name] WITHOUT CONTRAST TECHNIQUE: Multidetector CT imaging of the head and cervical [name] was performed following the standard protocol without intravenous contrast. Multiplanar CT image reconstructions of the cervical [name] were also generated. COMPARISON:  None. FINDINGS: CT HEAD FINDINGS Brain: Minimal small vessel ischemic disease of periventricular white matter. Mild age related involutional changes of the brain. No acute intracranial hemorrhage, infarct, hydrocephalus, extra-axial fluid collection or mass. Midline fourth ventricle and basal cisterns without effacement. Vascular: No hyperdense vessel sign. Skull: Intact Sinuses/Orbits: Nonacute Other: None CT CERVICAL [name] FINDINGS Alignment: Mild straightening of cervical lordosis. Minimal anterolisthesis of C3 on C4 likely on the basis of degenerative disc disease. Skull base and  vertebrae: Erosive change about the atlantodental interval with remodeled appearance of the anterior arch of C1 and about the odontoid process. Sclerotic focus noted of the C7 vertebral body, nonspecific in isolation but more likely to represent a bone island. No acute cervical [name] fracture. Uncovertebral joint osteoarthritis at C5-6 and C6-7 bilaterally greatest at the cervix. Mild-to-moderate moderate bilateral foraminal encroachment at these levels. Soft tissues and spinal canal: No prevertebral soft tissue swelling or visible canal hematoma. Disc levels: Marked disc flattening at C5-6 and moderate-to-marked at C6-7. Multilevel degenerative facet arthropathy. No jumped or perched facets. Upper chest: Negative Other: None IMPRESSION: No acute intracranial abnormality. No cervical [name] fracture. Cervical spondylosis. Electronically Signed   By: Ashley Royalty M.D.   On: 11/28/2017 23:55   Ct Chest W Contrast  Result Date: 11/29/2017 CLINICAL DATA:  83 y/o F; fall with lacerations to the elbows and the right-sided chin. Decreased sensation from the mid chest and below. No movement, sensation, or response to painful stimuli of the lower extremities. EXAM: CT CHEST, ABDOMEN, AND PELVIS WITH CONTRAST CT THORACIC [name] WITHOUT CONTRAST CT LUMBAR [name] WITHOUT CONTRAST TECHNIQUE: Multidetector CT imaging of the chest, abdomen and pelvis was performed following the standard protocol during bolus administration of intravenous contrast. Multidetector CT imaging of the thoracic and lumbar [name] were reconstructed from the CT of chest, abdomen, and pelvis images. Multiplanar CT image reconstructions were also generated. CONTRAST:  157mL OMNIPAQUE IOHEXOL 300 MG/ML  SOLN COMPARISON:  None. FINDINGS: CT CHEST FINDINGS Cardiovascular: No significant vascular findings. Normal heart size. No pericardial effusion. Mediastinum/Nodes: Subcentimeter nodule within the left lobe of the thyroid gland. No mediastinal or axillary  lymphadenopathy. Patent central airways. Lungs/Pleura: Lungs are clear. No pleural effusion or pneumothorax. Musculoskeletal: No proximal humerus, clavicle, scapula, or rib fracture identified. CT ABDOMEN PELVIS FINDINGS Hepatobiliary: No hepatic injury or perihepatic hematoma. Gallbladder is unremarkable Pancreas: Unremarkable. No pancreatic ductal dilatation or surrounding inflammatory changes. Spleen: No splenic injury or perisplenic hematoma. Adrenals/Urinary Tract: No adrenal hemorrhage or renal injury identified. Bladder is unremarkable. Subcentimeter renal cysts bilaterally. Stomach/Bowel: Stomach is within normal limits. Appendix appears normal. No evidence of bowel wall thickening, distention, or inflammatory changes. Vascular/Lymphatic: Aortic atherosclerosis. No enlarged abdominal or pelvic lymph nodes. Reproductive: Calcified uterine myomas measuring up to 2.2 cm extending anteriorly from the lower uterine segment. Uterus and bilateral adnexa are otherwise unremarkable. Other: No abdominal wall hernia or abnormality. No abdominopelvic ascites. Musculoskeletal: No proximal femur or pelvic fracture identified. CT THORACIC [name] FINDINGS Alignment: Normal. Vertebrae: Acute obliquely oriented fracture of  the T11 vertebral body extending from the anterior surface of the vertebral body to the mid inferior endplate with minimal displacement. The fracture does not propagate into the pedicles or posterior elements. No associated listhesis. No appreciable epidural hematoma. No additional acute fracture identified. Paraspinal and other soft tissues: Extensive prevertebral edema is present extending from the T6-L1 levels. Disc levels: There are multilevel mild disc and facet degenerative changes of the thoracic [name]. No high-grade bony foraminal or canal stenosis. CT LUMBAR [name] FINDINGS Segmentation: 5 lumbar type vertebrae. Alignment: L4-5 and L5-S1 grade 1 anterolisthesis with prominent facet arthropathy.  Slight T12-L1 retrolisthesis. Vertebrae: No acute fracture or focal pathologic process of the lumbar [name]. Paraspinal and other soft tissues: As above. Disc levels: Moderate loss of intervertebral disc space height is present at T12-L1 and L1-2. Mild loss of intervertebral disc space height is present at L4-5 and L5-S1. There are multilevel disc bulges which combined with facet hypertrophy result in mild-to-moderate foraminal stenosis from L2 through S1. Facet arthropathy is greatest at L4-5 and L5-S1. anterolisthesis combined with disc and facet degenerative changes at L4-5 results in at least moderate canal stenosis. IMPRESSION: 1. Acute fracture through the anterior aspect of T11 vertebral body and inferior endplate. No dislocation or listhesis. No appreciable epidural hematoma. Extensive prevertebral edema extending from T6-L1, likely related to fracture and soft tissue injury. Consider thoracic MRI to assess for cord contusion. 2. No additional fracture or internal injury of chest, abdomen, or pelvis identified. These results were called by telephone at the time of interpretation on 11/29/2017 at 12:00 am to Dr. Toy Cookey , who verbally acknowledged these results. Electronically Signed   By: Kristine Garbe M.D.   On: 11/29/2017 00:02   Ct Cervical [name] Wo Contrast  Result Date: 11/28/2017 CLINICAL DATA:  Head trauma after fall. EXAM: CT HEAD WITHOUT CONTRAST CT CERVICAL [name] WITHOUT CONTRAST TECHNIQUE: Multidetector CT imaging of the head and cervical [name] was performed following the standard protocol without intravenous contrast. Multiplanar CT image reconstructions of the cervical [name] were also generated. COMPARISON:  None. FINDINGS: CT HEAD FINDINGS Brain: Minimal small vessel ischemic disease of periventricular white matter. Mild age related involutional changes of the brain. No acute intracranial hemorrhage, infarct, hydrocephalus, extra-axial fluid collection or mass. Midline  fourth ventricle and basal cisterns without effacement. Vascular: No hyperdense vessel sign. Skull: Intact Sinuses/Orbits: Nonacute Other: None CT CERVICAL [name] FINDINGS Alignment: Mild straightening of cervical lordosis. Minimal anterolisthesis of C3 on C4 likely on the basis of degenerative disc disease. Skull base and vertebrae: Erosive change about the atlantodental interval with remodeled appearance of the anterior arch of C1 and about the odontoid process. Sclerotic focus noted of the C7 vertebral body, nonspecific in isolation but more likely to represent a bone island. No acute cervical [name] fracture. Uncovertebral joint osteoarthritis at C5-6 and C6-7 bilaterally greatest at the cervix. Mild-to-moderate moderate bilateral foraminal encroachment at these levels. Soft tissues and spinal canal: No prevertebral soft tissue swelling or visible canal hematoma. Disc levels: Marked disc flattening at C5-6 and moderate-to-marked at C6-7. Multilevel degenerative facet arthropathy. No jumped or perched facets. Upper chest: Negative Other: None IMPRESSION: No acute intracranial abnormality. No cervical [name] fracture. Cervical spondylosis. Electronically Signed   By: Ashley Royalty M.D.   On: 11/28/2017 23:55   Sherry [name] Wo Contrast  Result Date: 11/29/2017 CLINICAL DATA:  83 y/o  F; fall with immediate quadriplegia. EXAM: MRI CERVICAL [name] WITHOUT CONTRAST MRI THORACIC [name] WITHOUT CONTRAST TECHNIQUE: Multiplanar and  multiecho pulse sequences of the cervical [name], to include the craniocervical junction and cervicothoracic junction and thoracic [name], were obtained without intravenous contrast. COMPARISON:  11/28/2017 CT of cervical and thoracic [name]. FINDINGS: MRI CERVICAL [name] FINDINGS Alignment: Straightening of cervical lordosis. C7-T1 grade 1 anterolisthesis. Vertebrae: No acute fracture. Increased signal within the odontoid process, lateral masses of C1, and the anterior arch of C1 with erosive  changes on CT and surrounding soft tissue pannus is likely related to rheumatoid or crystal deposition disease. There is increased signal within the C6-7 intervertebral disc and a defect within the anterior longitudinal ligament (series 7, image 7) compatible with disc fracture and ligament tear. No associated malalignment. Additionally, there is extensive increased signal within the paraspinal muscles and interspinous spaces from C1-C7 likely representing muscle strain and ligamentous injury. Cord: Increased cord signal at the C5 and C6 levels. Given findings of ligamentous injury and disc injury, this is likely cord contusion. No findings of cord hemorrhage. Posterior Fossa, vertebral arteries, paraspinal tissues: As above. Disc levels: C2-3: Right greater than left facet hypertrophy. Mild right foraminal stenosis. No canal stenosis. C3-4: Mild disc osteophyte complex with left-greater-than-right uncovertebral and facet hypertrophy. Severe left and moderate right foraminal stenosis. Mild canal stenosis. C4-5: Mild disc osteophyte complex with bilateral uncovertebral and facet hypertrophy. Moderate bilateral foraminal stenosis and mild canal stenosis. C5-6: Moderate disc osteophyte complex with advanced uncovertebral and facet hypertrophy. Severe bilateral foraminal stenosis and moderate canal stenosis. Disc contact on the anterior cord. C6-7: Moderate disc osteophyte complex with bilateral advanced uncovertebral and facet hypertrophy. Severe bilateral foraminal stenosis. Moderate canal stenosis. Disc contact on the anterior cord. C7-T1: Grade 1 anterolisthesis with small uncovered disc bulge and moderate bilateral facet hypertrophy as well as ligamentum flavum hypertrophy. Mild foraminal stenosis and canal stenosis. MRI THORACIC [name] FINDINGS Alignment:  Physiologic. Vertebrae: Acute T11 vertebral body fracture extending through the anterior vertebral body, to the inferior endplate, and across the disc. No  displacement, dislocation, or malalignment. Associated paravertebral edema extending from T6-L1. No additional fracture of the thoracic [name] identified. No findings of discitis. Cord:  Normal signal and morphology. Paraspinal and other soft tissues: As above. Disc levels: Multilevel mild discogenic degenerative changes with tiny disc protrusions. No significant foraminal or canal stenosis. IMPRESSION: MRI cervical [name]: 1. C6-7 disc fracture and tear of anterior longitudinal ligament. 2. Paraspinal muscle and interspinous edema from C1-C7 compatible with ligament injury and muscle strain. 3. No cervical [name] osseous fracture or dislocation. 4. Increased cord signal at C5 and C6, likely cord contusion. No cord hemorrhage. 5. Edema within C1 and C2 vertebral bodies with small erosion and pannus on CT, likely sequelae of rheumatoid or crystal deposition disease. 6. Multilevel cervical spondylosis greatest at C5-6 and C6-7 where there is moderate canal stenosis. MRI thoracic [name]: 1. Acute T11 vertebral body fracture extending through anterior vertebral body to inferior endplate across the disc. No dislocation. 2. No additional fracture of the thoracic [name]. No abnormal cord signal. 3. Mild discogenic degenerative changes of thoracic [name]. No significant foraminal or canal stenosis. These results were called by telephone at the time of interpretation on 11/29/2017 at 3:47 am to Dr. Christy Gentles, who verbally acknowledged these results. Electronically Signed   By: Kristine Garbe M.D.   On: 11/29/2017 03:50   Sherry [name] Wo Contrast  Result Date: 11/29/2017 CLINICAL DATA:  83 y/o  F; fall with immediate quadriplegia. EXAM: MRI CERVICAL [name] WITHOUT CONTRAST MRI THORACIC [name] WITHOUT CONTRAST TECHNIQUE: Multiplanar and multiecho  pulse sequences of the cervical [name], to include the craniocervical junction and cervicothoracic junction and thoracic [name], were obtained without intravenous contrast.  COMPARISON:  11/28/2017 CT of cervical and thoracic [name]. FINDINGS: MRI CERVICAL [name] FINDINGS Alignment: Straightening of cervical lordosis. C7-T1 grade 1 anterolisthesis. Vertebrae: No acute fracture. Increased signal within the odontoid process, lateral masses of C1, and the anterior arch of C1 with erosive changes on CT and surrounding soft tissue pannus is likely related to rheumatoid or crystal deposition disease. There is increased signal within the C6-7 intervertebral disc and a defect within the anterior longitudinal ligament (series 7, image 7) compatible with disc fracture and ligament tear. No associated malalignment. Additionally, there is extensive increased signal within the paraspinal muscles and interspinous spaces from C1-C7 likely representing muscle strain and ligamentous injury. Cord: Increased cord signal at the C5 and C6 levels. Given findings of ligamentous injury and disc injury, this is likely cord contusion. No findings of cord hemorrhage. Posterior Fossa, vertebral arteries, paraspinal tissues: As above. Disc levels: C2-3: Right greater than left facet hypertrophy. Mild right foraminal stenosis. No canal stenosis. C3-4: Mild disc osteophyte complex with left-greater-than-right uncovertebral and facet hypertrophy. Severe left and moderate right foraminal stenosis. Mild canal stenosis. C4-5: Mild disc osteophyte complex with bilateral uncovertebral and facet hypertrophy. Moderate bilateral foraminal stenosis and mild canal stenosis. C5-6: Moderate disc osteophyte complex with advanced uncovertebral and facet hypertrophy. Severe bilateral foraminal stenosis and moderate canal stenosis. Disc contact on the anterior cord. C6-7: Moderate disc osteophyte complex with bilateral advanced uncovertebral and facet hypertrophy. Severe bilateral foraminal stenosis. Moderate canal stenosis. Disc contact on the anterior cord. C7-T1: Grade 1 anterolisthesis with small uncovered disc bulge and moderate  bilateral facet hypertrophy as well as ligamentum flavum hypertrophy. Mild foraminal stenosis and canal stenosis. MRI THORACIC [name] FINDINGS Alignment:  Physiologic. Vertebrae: Acute T11 vertebral body fracture extending through the anterior vertebral body, to the inferior endplate, and across the disc. No displacement, dislocation, or malalignment. Associated paravertebral edema extending from T6-L1. No additional fracture of the thoracic [name] identified. No findings of discitis. Cord:  Normal signal and morphology. Paraspinal and other soft tissues: As above. Disc levels: Multilevel mild discogenic degenerative changes with tiny disc protrusions. No significant foraminal or canal stenosis. IMPRESSION: MRI cervical [name]: 1. C6-7 disc fracture and tear of anterior longitudinal ligament. 2. Paraspinal muscle and interspinous edema from C1-C7 compatible with ligament injury and muscle strain. 3. No cervical [name] osseous fracture or dislocation. 4. Increased cord signal at C5 and C6, likely cord contusion. No cord hemorrhage. 5. Edema within C1 and C2 vertebral bodies with small erosion and pannus on CT, likely sequelae of rheumatoid or crystal deposition disease. 6. Multilevel cervical spondylosis greatest at C5-6 and C6-7 where there is moderate canal stenosis. MRI thoracic [name]: 1. Acute T11 vertebral body fracture extending through anterior vertebral body to inferior endplate across the disc. No dislocation. 2. No additional fracture of the thoracic [name]. No abnormal cord signal. 3. Mild discogenic degenerative changes of thoracic [name]. No significant foraminal or canal stenosis. These results were called by telephone at the time of interpretation on 11/29/2017 at 3:47 am to Dr. Christy Gentles, who verbally acknowledged these results. Electronically Signed   By: Kristine Garbe M.D.   On: 11/29/2017 03:50   Ct Abdomen Pelvis W Contrast  Result Date: 11/29/2017 CLINICAL DATA:  83 y/o F; fall with  lacerations to the elbows and the right-sided chin. Decreased sensation from the mid chest and below. No movement,  sensation, or response to painful stimuli of the lower extremities. EXAM: CT CHEST, ABDOMEN, AND PELVIS WITH CONTRAST CT THORACIC [name] WITHOUT CONTRAST CT LUMBAR [name] WITHOUT CONTRAST TECHNIQUE: Multidetector CT imaging of the chest, abdomen and pelvis was performed following the standard protocol during bolus administration of intravenous contrast. Multidetector CT imaging of the thoracic and lumbar [name] were reconstructed from the CT of chest, abdomen, and pelvis images. Multiplanar CT image reconstructions were also generated. CONTRAST:  146mL OMNIPAQUE IOHEXOL 300 MG/ML  SOLN COMPARISON:  None. FINDINGS: CT CHEST FINDINGS Cardiovascular: No significant vascular findings. Normal heart size. No pericardial effusion. Mediastinum/Nodes: Subcentimeter nodule within the left lobe of the thyroid gland. No mediastinal or axillary lymphadenopathy. Patent central airways. Lungs/Pleura: Lungs are clear. No pleural effusion or pneumothorax. Musculoskeletal: No proximal humerus, clavicle, scapula, or rib fracture identified. CT ABDOMEN PELVIS FINDINGS Hepatobiliary: No hepatic injury or perihepatic hematoma. Gallbladder is unremarkable Pancreas: Unremarkable. No pancreatic ductal dilatation or surrounding inflammatory changes. Spleen: No splenic injury or perisplenic hematoma. Adrenals/Urinary Tract: No adrenal hemorrhage or renal injury identified. Bladder is unremarkable. Subcentimeter renal cysts bilaterally. Stomach/Bowel: Stomach is within normal limits. Appendix appears normal. No evidence of bowel wall thickening, distention, or inflammatory changes. Vascular/Lymphatic: Aortic atherosclerosis. No enlarged abdominal or pelvic lymph nodes. Reproductive: Calcified uterine myomas measuring up to 2.2 cm extending anteriorly from the lower uterine segment. Uterus and bilateral adnexa are otherwise  unremarkable. Other: No abdominal wall hernia or abnormality. No abdominopelvic ascites. Musculoskeletal: No proximal femur or pelvic fracture identified. CT THORACIC [name] FINDINGS Alignment: Normal. Vertebrae: Acute obliquely oriented fracture of the T11 vertebral body extending from the anterior surface of the vertebral body to the mid inferior endplate with minimal displacement. The fracture does not propagate into the pedicles or posterior elements. No associated listhesis. No appreciable epidural hematoma. No additional acute fracture identified. Paraspinal and other soft tissues: Extensive prevertebral edema is present extending from the T6-L1 levels. Disc levels: There are multilevel mild disc and facet degenerative changes of the thoracic [name]. No high-grade bony foraminal or canal stenosis. CT LUMBAR [name] FINDINGS Segmentation: 5 lumbar type vertebrae. Alignment: L4-5 and L5-S1 grade 1 anterolisthesis with prominent facet arthropathy. Slight T12-L1 retrolisthesis. Vertebrae: No acute fracture or focal pathologic process of the lumbar [name]. Paraspinal and other soft tissues: As above. Disc levels: Moderate loss of intervertebral disc space height is present at T12-L1 and L1-2. Mild loss of intervertebral disc space height is present at L4-5 and L5-S1. There are multilevel disc bulges which combined with facet hypertrophy result in mild-to-moderate foraminal stenosis from L2 through S1. Facet arthropathy is greatest at L4-5 and L5-S1. anterolisthesis combined with disc and facet degenerative changes at L4-5 results in at least moderate canal stenosis. IMPRESSION: 1. Acute fracture through the anterior aspect of T11 vertebral body and inferior endplate. No dislocation or listhesis. No appreciable epidural hematoma. Extensive prevertebral edema extending from T6-L1, likely related to fracture and soft tissue injury. Consider thoracic MRI to assess for cord contusion. 2. No additional fracture or internal  injury of chest, abdomen, or pelvis identified. These results were called by telephone at the time of interpretation on 11/29/2017 at 12:00 am to Dr. Toy Cookey , who verbally acknowledged these results. Electronically Signed   By: Kristine Garbe M.D.   On: 11/29/2017 00:02   Dg Pelvis Portable  Result Date: 11/28/2017 CLINICAL DATA:  [address] off porch tonight. EXAM: PORTABLE PELVIS 1-2 VIEWS COMPARISON:  None. FINDINGS: Diffuse osteopenia. Mild symmetric degenerative change of the hips.  Subtle lucency along the cortex of the lateral aspect of the left femoral head as well as subtle linear lucency over the lateral aspect of the right femoral head near the subcapital region as these findings are likely within normal although cannot exclude nondisplaced fractures. Degenerative change of the [name]. IMPRESSION: Subtle lucencies over the lateral aspect of the femoral heads bilaterally likely within normal although cannot exclude fracture. CT may be helpful for further evaluation. Mild degenerative change of the hips and [name]. Electronically Signed   By: Marin Olp M.D.   On: 11/28/2017 22:02   Ct T-[name] No Charge  Result Date: 11/29/2017 CLINICAL DATA:  83 y/o F; fall with lacerations to the elbows and the right-sided chin. Decreased sensation from the mid chest and below. No movement, sensation, or response to painful stimuli of the lower extremities. EXAM: CT CHEST, ABDOMEN, AND PELVIS WITH CONTRAST CT THORACIC [name] WITHOUT CONTRAST CT LUMBAR [name] WITHOUT CONTRAST TECHNIQUE: Multidetector CT imaging of the chest, abdomen and pelvis was performed following the standard protocol during bolus administration of intravenous contrast. Multidetector CT imaging of the thoracic and lumbar [name] were reconstructed from the CT of chest, abdomen, and pelvis images. Multiplanar CT image reconstructions were also generated. CONTRAST:  123mL OMNIPAQUE IOHEXOL 300 MG/ML  SOLN COMPARISON:  None. FINDINGS: CT  CHEST FINDINGS Cardiovascular: No significant vascular findings. Normal heart size. No pericardial effusion. Mediastinum/Nodes: Subcentimeter nodule within the left lobe of the thyroid gland. No mediastinal or axillary lymphadenopathy. Patent central airways. Lungs/Pleura: Lungs are clear. No pleural effusion or pneumothorax. Musculoskeletal: No proximal humerus, clavicle, scapula, or rib fracture identified. CT ABDOMEN PELVIS FINDINGS Hepatobiliary: No hepatic injury or perihepatic hematoma. Gallbladder is unremarkable Pancreas: Unremarkable. No pancreatic ductal dilatation or surrounding inflammatory changes. Spleen: No splenic injury or perisplenic hematoma. Adrenals/Urinary Tract: No adrenal hemorrhage or renal injury identified. Bladder is unremarkable. Subcentimeter renal cysts bilaterally. Stomach/Bowel: Stomach is within normal limits. Appendix appears normal. No evidence of bowel wall thickening, distention, or inflammatory changes. Vascular/Lymphatic: Aortic atherosclerosis. No enlarged abdominal or pelvic lymph nodes. Reproductive: Calcified uterine myomas measuring up to 2.2 cm extending anteriorly from the lower uterine segment. Uterus and bilateral adnexa are otherwise unremarkable. Other: No abdominal wall hernia or abnormality. No abdominopelvic ascites. Musculoskeletal: No proximal femur or pelvic fracture identified. CT THORACIC [name] FINDINGS Alignment: Normal. Vertebrae: Acute obliquely oriented fracture of the T11 vertebral body extending from the anterior surface of the vertebral body to the mid inferior endplate with minimal displacement. The fracture does not propagate into the pedicles or posterior elements. No associated listhesis. No appreciable epidural hematoma. No additional acute fracture identified. Paraspinal and other soft tissues: Extensive prevertebral edema is present extending from the T6-L1 levels. Disc levels: There are multilevel mild disc and facet degenerative changes of  the thoracic [name]. No high-grade bony foraminal or canal stenosis. CT LUMBAR [name] FINDINGS Segmentation: 5 lumbar type vertebrae. Alignment: L4-5 and L5-S1 grade 1 anterolisthesis with prominent facet arthropathy. Slight T12-L1 retrolisthesis. Vertebrae: No acute fracture or focal pathologic process of the lumbar [name]. Paraspinal and other soft tissues: As above. Disc levels: Moderate loss of intervertebral disc space height is present at T12-L1 and L1-2. Mild loss of intervertebral disc space height is present at L4-5 and L5-S1. There are multilevel disc bulges which combined with facet hypertrophy result in mild-to-moderate foraminal stenosis from L2 through S1. Facet arthropathy is greatest at L4-5 and L5-S1. anterolisthesis combined with disc and facet degenerative changes at L4-5 results in at least moderate  canal stenosis. IMPRESSION: 1. Acute fracture through the anterior aspect of T11 vertebral body and inferior endplate. No dislocation or listhesis. No appreciable epidural hematoma. Extensive prevertebral edema extending from T6-L1, likely related to fracture and soft tissue injury. Consider thoracic MRI to assess for cord contusion. 2. No additional fracture or internal injury of chest, abdomen, or pelvis identified. These results were called by telephone at the time of interpretation on 11/29/2017 at 12:00 am to Dr. Toy Cookey , who verbally acknowledged these results. Electronically Signed   By: Kristine Garbe M.D.   On: 11/29/2017 00:02   Ct L-[name] No Charge  Result Date: 11/29/2017 CLINICAL DATA:  83 y/o F; fall with lacerations to the elbows and the right-sided chin. Decreased sensation from the mid chest and below. No movement, sensation, or response to painful stimuli of the lower extremities. EXAM: CT CHEST, ABDOMEN, AND PELVIS WITH CONTRAST CT THORACIC [name] WITHOUT CONTRAST CT LUMBAR [name] WITHOUT CONTRAST TECHNIQUE: Multidetector CT imaging of the chest, abdomen and pelvis  was performed following the standard protocol during bolus administration of intravenous contrast. Multidetector CT imaging of the thoracic and lumbar [name] were reconstructed from the CT of chest, abdomen, and pelvis images. Multiplanar CT image reconstructions were also generated. CONTRAST:  143mL OMNIPAQUE IOHEXOL 300 MG/ML  SOLN COMPARISON:  None. FINDINGS: CT CHEST FINDINGS Cardiovascular: No significant vascular findings. Normal heart size. No pericardial effusion. Mediastinum/Nodes: Subcentimeter nodule within the left lobe of the thyroid gland. No mediastinal or axillary lymphadenopathy. Patent central airways. Lungs/Pleura: Lungs are clear. No pleural effusion or pneumothorax. Musculoskeletal: No proximal humerus, clavicle, scapula, or rib fracture identified. CT ABDOMEN PELVIS FINDINGS Hepatobiliary: No hepatic injury or perihepatic hematoma. Gallbladder is unremarkable Pancreas: Unremarkable. No pancreatic ductal dilatation or surrounding inflammatory changes. Spleen: No splenic injury or perisplenic hematoma. Adrenals/Urinary Tract: No adrenal hemorrhage or renal injury identified. Bladder is unremarkable. Subcentimeter renal cysts bilaterally. Stomach/Bowel: Stomach is within normal limits. Appendix appears normal. No evidence of bowel wall thickening, distention, or inflammatory changes. Vascular/Lymphatic: Aortic atherosclerosis. No enlarged abdominal or pelvic lymph nodes. Reproductive: Calcified uterine myomas measuring up to 2.2 cm extending anteriorly from the lower uterine segment. Uterus and bilateral adnexa are otherwise unremarkable. Other: No abdominal wall hernia or abnormality. No abdominopelvic ascites. Musculoskeletal: No proximal femur or pelvic fracture identified. CT THORACIC [name] FINDINGS Alignment: Normal. Vertebrae: Acute obliquely oriented fracture of the T11 vertebral body extending from the anterior surface of the vertebral body to the mid inferior endplate with minimal  displacement. The fracture does not propagate into the pedicles or posterior elements. No associated listhesis. No appreciable epidural hematoma. No additional acute fracture identified. Paraspinal and other soft tissues: Extensive prevertebral edema is present extending from the T6-L1 levels. Disc levels: There are multilevel mild disc and facet degenerative changes of the thoracic [name]. No high-grade bony foraminal or canal stenosis. CT LUMBAR [name] FINDINGS Segmentation: 5 lumbar type vertebrae. Alignment: L4-5 and L5-S1 grade 1 anterolisthesis with prominent facet arthropathy. Slight T12-L1 retrolisthesis. Vertebrae: No acute fracture or focal pathologic process of the lumbar [name]. Paraspinal and other soft tissues: As above. Disc levels: Moderate loss of intervertebral disc space height is present at T12-L1 and L1-2. Mild loss of intervertebral disc space height is present at L4-5 and L5-S1. There are multilevel disc bulges which combined with facet hypertrophy result in mild-to-moderate foraminal stenosis from L2 through S1. Facet arthropathy is greatest at L4-5 and L5-S1. anterolisthesis combined with disc and facet degenerative changes at L4-5 results in at  least moderate canal stenosis. IMPRESSION: 1. Acute fracture through the anterior aspect of T11 vertebral body and inferior endplate. No dislocation or listhesis. No appreciable epidural hematoma. Extensive prevertebral edema extending from T6-L1, likely related to fracture and soft tissue injury. Consider thoracic MRI to assess for cord contusion. 2. No additional fracture or internal injury of chest, abdomen, or pelvis identified. These results were called by telephone at the time of interpretation on 11/29/2017 at 12:00 am to Dr. Toy Cookey , who verbally acknowledged these results. Electronically Signed   By: Kristine Garbe M.D.   On: 11/29/2017 00:02   Dg Chest Port 1 View  Result Date: 11/28/2017 CLINICAL DATA:  [address] off porch  tonight with decreased sensation below nipple. EXAM: PORTABLE CHEST 1 VIEW COMPARISON:  None. FINDINGS: Lungs are adequately inflated without focal airspace consolidation, effusion or pneumothorax. Cardiomediastinal silhouette is within normal. Degenerative change of the [name]. No fracture. IMPRESSION: No acute findings. Electronically Signed   By: Marin Olp M.D.   On: 11/28/2017 21:59    Procedures .Marland KitchenLaceration Repair Date/Time: 11/29/2017 2:23 PM Performed by: Melina Copa, MD Authorized by: Melina Copa, MD   Consent:    Consent obtained:  Verbal   Consent given by:  Patient   Risks discussed:  Infection, pain, poor wound healing, poor cosmetic result and nerve damage   Alternatives discussed:  No treatment Anesthesia (see MAR for exact dosages):    Anesthesia method:  Local infiltration   Local anesthetic:  Lidocaine 1% w/o epi Laceration details:    Location:  Shoulder/arm   Shoulder/arm location:  L elbow   Length (cm):  7 Repair type:    Repair type:  Simple Pre-procedure details:    Preparation:  Patient was prepped and draped in usual sterile fashion and imaging obtained to evaluate for foreign bodies Exploration:    Hemostasis achieved with:  Direct pressure   Wound exploration: wound explored through full range of motion and entire depth of wound probed and visualized     Wound extent: no foreign bodies/material noted     Contaminated: no   Treatment:    Irrigation solution:  Sterile water   Irrigation method:  Pressure wash   Visualized foreign bodies/material removed: no   Skin repair:    Repair method:  Sutures   Suture size:  4-0   Suture material:  Prolene   Number of sutures:  9 Approximation:    Approximation:  Close Post-procedure details:    Dressing:  Bulky dressing   Patient tolerance of procedure:  Tolerated well, no immediate complications   (including critical care time)  Medications Ordered in ED Medications  amLODipine  (NORVASC) tablet 10 mg (has no administration in time range)  metroNIDAZOLE (METROGEL) 0.09 % gel 1 application (has no administration in time range)  triamcinolone cream (KENALOG) 0.1 % 1 application (has no administration in time range)  sodium chloride flush (NS) 0.9 % injection 3 mL (has no administration in time range)  sodium chloride flush (NS) 0.9 % injection 3 mL (has no administration in time range)  0.9 %  sodium chloride infusion (0 mLs Intravenous Hold 11/29/17 0634)  acetaminophen (TYLENOL) tablet 650 mg (has no administration in time range)    Or  acetaminophen (TYLENOL) suppository 650 mg (has no administration in time range)  ondansetron (ZOFRAN) tablet 4 mg ( Oral See Alternative 11/29/17 1331)    Or  ondansetron (ZOFRAN) injection 4 mg (4 mg Intravenous Given 11/29/17 1331)  menthol-cetylpyridinium (CEPACOL)  lozenge 3 mg (has no administration in time range)    Or  phenol (CHLORASEPTIC) mouth spray 1 spray (has no administration in time range)  0.9 %  sodium chloride infusion ( Intravenous Hold 11/29/17 0633)  oxyCODONE (Oxy IR/ROXICODONE) immediate release tablet 5 mg (has no administration in time range)  oxyCODONE (Oxy IR/ROXICODONE) immediate release tablet 10 mg (10 mg Oral Given 11/29/17 1330)  HYDROmorphone (DILAUDID) injection 0.5 mg (0.5 mg Intravenous Given 11/29/17 1118)  polyethylene glycol (MIRALAX / GLYCOLAX) packet 17 g (has no administration in time range)  iohexol (OMNIPAQUE) 300 MG/ML solution 100 mL (100 mLs Intravenous Contrast Given 11/28/17 2229)  lidocaine (PF) (XYLOCAINE) 1 % injection 30 mL (30 mLs Infiltration Given by Other 11/28/17 2323)  Tdap (BOOSTRIX) injection 0.5 mL (0.5 mLs Intramuscular Given 11/29/17 0022)  0.9 %  sodium chloride infusion ( Intravenous Stopped 11/29/17 0841)  HYDROmorphone (DILAUDID) injection 0.4 mg (0.4 mg Intravenous Given 11/29/17 0701)     Initial Impression / Assessment and Plan / ED Course  I have reviewed the  triage vital signs and the nursing notes.  Pertinent labs & imaging results that were available during my care of the patient were reviewed by me and considered in my medical decision making (see chart for details).     Patient is an 83 year old [name] with a past medical history as detailed above who presents to the emergency department as a level 2 trauma following a fall.  Patient was made a level 2 trauma as she reported she had diminished sensation below the level of approximately the mid chest.  Upon arrival patient has a patent airway and is breathing spontaneously.  She has clear breath sounds bilaterally.  She has an adequate blood pressure.  Patient's physical exam is as detailed above.  This is concerning for a spinal cord injury.  As a result laboratory and imaging studies were obtained.  Patient's laboratory imagings results are as above, but patient does appear to have T11 fracture.  Secondary to patient's fracture as well as her concerning physical exam neurosurgery was consulted to evaluate the patient in the emergency department.  They believe the patient's presentation is consistent with central cord syndrome.  As result patient will be admitted to the neurosurgery service for further evaluation and care.  For further information regarding patient's continued hospital course please see admitting team's documentation.  Patient with no further acute events while under my care.  The care of this patient was discussed with my attending physician Dr. Billy Fischer, who voices agreement with work-up and ED disposition.  Final Clinical Impressions(s) / ED Diagnoses   Final diagnoses:  Central cord syndrome, initial encounter [facility])    ED Discharge Orders    None       Byrd Rushlow, Chanda Busing, MD 11/29/17 1426    Gareth Morgan, MD 11/30/17 2200

## 2017-11-29 ENCOUNTER — Inpatient Hospital Stay (HOSPITAL_COMMUNITY): Payer: Medicare HMO | Admitting: Certified Registered"

## 2017-11-29 ENCOUNTER — Inpatient Hospital Stay (HOSPITAL_COMMUNITY): Payer: Medicare HMO

## 2017-11-29 ENCOUNTER — Encounter (HOSPITAL_COMMUNITY): Payer: Self-pay | Admitting: Certified Registered"

## 2017-11-29 ENCOUNTER — Inpatient Hospital Stay (HOSPITAL_COMMUNITY)
Admission: EM | Disposition: A | Discharge status: Rehabilitation Facility | Payer: Self-pay | Source: Home / Self Care | Attending: Neurological Surgery

## 2017-11-29 DIAGNOSIS — M792 Neuralgia and neuritis, unspecified: Secondary | ICD-10-CM | POA: Diagnosis not present

## 2017-11-29 DIAGNOSIS — A499 Bacterial infection, unspecified: Secondary | ICD-10-CM | POA: Diagnosis not present

## 2017-11-29 DIAGNOSIS — S14126A Central cord syndrome at C6 level of cervical spinal cord, initial encounter: Secondary | ICD-10-CM | POA: Diagnosis not present

## 2017-11-29 DIAGNOSIS — S14129A Central cord syndrome at unspecified level of cervical spinal cord, initial encounter: Secondary | ICD-10-CM | POA: Diagnosis present

## 2017-11-29 DIAGNOSIS — I82409 Acute embolism and thrombosis of unspecified deep veins of unspecified lower extremity: Secondary | ICD-10-CM | POA: Diagnosis not present

## 2017-11-29 DIAGNOSIS — S22089A Unspecified fracture of T11-T12 vertebra, initial encounter for closed fracture: Secondary | ICD-10-CM | POA: Diagnosis present

## 2017-11-29 DIAGNOSIS — R339 Retention of urine, unspecified: Secondary | ICD-10-CM | POA: Diagnosis not present

## 2017-11-29 DIAGNOSIS — K592 Neurogenic bowel, not elsewhere classified: Secondary | ICD-10-CM | POA: Diagnosis not present

## 2017-11-29 DIAGNOSIS — I10 Essential (primary) hypertension: Secondary | ICD-10-CM | POA: Diagnosis not present

## 2017-11-29 DIAGNOSIS — M1990 Unspecified osteoarthritis, unspecified site: Secondary | ICD-10-CM | POA: Diagnosis present

## 2017-11-29 DIAGNOSIS — I82411 Acute embolism and thrombosis of right femoral vein: Secondary | ICD-10-CM | POA: Diagnosis not present

## 2017-11-29 DIAGNOSIS — T1490XA Injury, unspecified, initial encounter: Secondary | ICD-10-CM | POA: Diagnosis present

## 2017-11-29 DIAGNOSIS — D62 Acute posthemorrhagic anemia: Secondary | ICD-10-CM | POA: Diagnosis not present

## 2017-11-29 DIAGNOSIS — N319 Neuromuscular dysfunction of bladder, unspecified: Secondary | ICD-10-CM | POA: Diagnosis not present

## 2017-11-29 DIAGNOSIS — Z91018 Allergy to other foods: Secondary | ICD-10-CM | POA: Diagnosis not present

## 2017-11-29 DIAGNOSIS — E876 Hypokalemia: Secondary | ICD-10-CM | POA: Diagnosis not present

## 2017-11-29 DIAGNOSIS — Z888 Allergy status to other drugs, medicaments and biological substances status: Secondary | ICD-10-CM | POA: Diagnosis not present

## 2017-11-29 DIAGNOSIS — S14127A Central cord syndrome at C7 level of cervical spinal cord, initial encounter: Secondary | ICD-10-CM | POA: Diagnosis present

## 2017-11-29 DIAGNOSIS — M7989 Other specified soft tissue disorders: Secondary | ICD-10-CM | POA: Diagnosis not present

## 2017-11-29 DIAGNOSIS — M4802 Spinal stenosis, cervical region: Secondary | ICD-10-CM | POA: Diagnosis present

## 2017-11-29 DIAGNOSIS — S51012A Laceration without foreign body of left elbow, initial encounter: Secondary | ICD-10-CM | POA: Diagnosis present

## 2017-11-29 DIAGNOSIS — S50811A Abrasion of right forearm, initial encounter: Secondary | ICD-10-CM | POA: Diagnosis present

## 2017-11-29 DIAGNOSIS — D72829 Elevated white blood cell count, unspecified: Secondary | ICD-10-CM | POA: Diagnosis not present

## 2017-11-29 DIAGNOSIS — R1310 Dysphagia, unspecified: Secondary | ICD-10-CM | POA: Diagnosis not present

## 2017-11-29 DIAGNOSIS — I825Y3 Chronic embolism and thrombosis of unspecified deep veins of proximal lower extremity, bilateral: Secondary | ICD-10-CM | POA: Diagnosis not present

## 2017-11-29 DIAGNOSIS — G8250 Quadriplegia, unspecified: Secondary | ICD-10-CM | POA: Diagnosis present

## 2017-11-29 DIAGNOSIS — Z79899 Other long term (current) drug therapy: Secondary | ICD-10-CM | POA: Diagnosis not present

## 2017-11-29 DIAGNOSIS — Z23 Encounter for immunization: Secondary | ICD-10-CM | POA: Diagnosis present

## 2017-11-29 DIAGNOSIS — M479 Spondylosis, unspecified: Secondary | ICD-10-CM | POA: Diagnosis present

## 2017-11-29 DIAGNOSIS — M810 Age-related osteoporosis without current pathological fracture: Secondary | ICD-10-CM | POA: Diagnosis present

## 2017-11-29 DIAGNOSIS — F329 Major depressive disorder, single episode, unspecified: Secondary | ICD-10-CM | POA: Diagnosis not present

## 2017-11-29 DIAGNOSIS — M2578 Osteophyte, vertebrae: Secondary | ICD-10-CM | POA: Diagnosis present

## 2017-11-29 DIAGNOSIS — B9620 Unspecified Escherichia coli [E. coli] as the cause of diseases classified elsewhere: Secondary | ICD-10-CM | POA: Diagnosis not present

## 2017-11-29 DIAGNOSIS — Z791 Long term (current) use of non-steroidal anti-inflammatories (NSAID): Secondary | ICD-10-CM | POA: Diagnosis not present

## 2017-11-29 DIAGNOSIS — S01511A Laceration without foreign body of lip, initial encounter: Secondary | ICD-10-CM | POA: Diagnosis present

## 2017-11-29 DIAGNOSIS — W1830XA Fall on same level, unspecified, initial encounter: Secondary | ICD-10-CM | POA: Diagnosis not present

## 2017-11-29 DIAGNOSIS — S14129D Central cord syndrome at unspecified level of cervical spinal cord, subsequent encounter: Secondary | ICD-10-CM | POA: Diagnosis not present

## 2017-11-29 DIAGNOSIS — S14129S Central cord syndrome at unspecified level of cervical spinal cord, sequela: Secondary | ICD-10-CM | POA: Diagnosis not present

## 2017-11-29 DIAGNOSIS — S0181XA Laceration without foreign body of other part of head, initial encounter: Secondary | ICD-10-CM | POA: Diagnosis present

## 2017-11-29 DIAGNOSIS — R413 Other amnesia: Secondary | ICD-10-CM | POA: Diagnosis present

## 2017-11-29 DIAGNOSIS — N390 Urinary tract infection, site not specified: Secondary | ICD-10-CM | POA: Diagnosis not present

## 2017-11-29 HISTORY — DX: Central cord syndrome at C6 level of cervical spinal cord, initial encounter: S14.126A

## 2017-11-29 HISTORY — PX: ANTERIOR CERVICAL DECOMP/DISCECTOMY FUSION: SHX1161

## 2017-11-29 LAB — URINALYSIS, ROUTINE W REFLEX MICROSCOPIC
Bilirubin Urine: NEGATIVE
Glucose, UA: NEGATIVE mg/dL
HGB URINE DIPSTICK: NEGATIVE
Ketones, ur: NEGATIVE mg/dL
Leukocytes, UA: NEGATIVE
NITRITE: NEGATIVE
PH: 6 (ref 5.0–8.0)
Protein, ur: NEGATIVE mg/dL
SPECIFIC GRAVITY, URINE: 1.018 (ref 1.005–1.030)

## 2017-11-29 SURGERY — ANTERIOR CERVICAL DECOMPRESSION/DISCECTOMY FUSION 2 LEVELS
Anesthesia: General | Site: Spine Cervical

## 2017-11-29 MED ORDER — ONDANSETRON HCL 4 MG/2ML IJ SOLN
INTRAMUSCULAR | Status: DC | PRN
  Administered 2017-11-29: 4 mg via INTRAVENOUS

## 2017-11-29 MED ORDER — ONDANSETRON HCL 4 MG PO TABS: 4.0000 mg | ORAL_TABLET | Freq: Four times a day (QID) | ORAL | Status: DC | PRN

## 2017-11-29 MED ORDER — SUGAMMADEX SODIUM 200 MG/2ML IV SOLN
INTRAVENOUS | Status: DC | PRN
  Administered 2017-11-29: 200 mg via INTRAVENOUS

## 2017-11-29 MED ORDER — CEFAZOLIN SODIUM-DEXTROSE 2-4 GM/100ML-% IV SOLN
2.0000 g | Freq: Once | INTRAVENOUS | Status: AC
  Administered 2017-11-29: 2 g via INTRAVENOUS

## 2017-11-29 MED ORDER — LACTATED RINGERS IV SOLN
INTRAVENOUS | Status: DC
  Administered 2017-11-29 (×2): via INTRAVENOUS

## 2017-11-29 MED ORDER — ACETAMINOPHEN 325 MG PO TABS
650.0000 mg | ORAL_TABLET | ORAL | Status: DC | PRN
  Administered 2017-12-04 – 2017-12-05 (×2): 650 mg via ORAL
  Filled 2017-11-29 (×3): qty 2

## 2017-11-29 MED ORDER — 0.9 % SODIUM CHLORIDE (POUR BTL) OPTIME
TOPICAL | Status: DC | PRN
  Administered 2017-11-29: 1000 mL

## 2017-11-29 MED ORDER — CEFAZOLIN SODIUM-DEXTROSE 2-4 GM/100ML-% IV SOLN
INTRAVENOUS | Status: AC
  Filled 2017-11-29: qty 100

## 2017-11-29 MED ORDER — THROMBIN 5000 UNITS EX SOLR
CUTANEOUS | Status: AC
  Filled 2017-11-29: qty 5000

## 2017-11-29 MED ORDER — ROCURONIUM BROMIDE 50 MG/5ML IV SOSY
PREFILLED_SYRINGE | INTRAVENOUS | Status: AC
  Filled 2017-11-29: qty 5

## 2017-11-29 MED ORDER — HYDROMORPHONE HCL 1 MG/ML IJ SOLN
0.4000 mg | Freq: Once | INTRAMUSCULAR | Status: AC
  Administered 2017-11-29: 0.4 mg via INTRAVENOUS
  Filled 2017-11-29: qty 1

## 2017-11-29 MED ORDER — DEXAMETHASONE SODIUM PHOSPHATE 10 MG/ML IJ SOLN
INTRAMUSCULAR | Status: AC
  Filled 2017-11-29: qty 1

## 2017-11-29 MED ORDER — PROPOFOL 10 MG/ML IV BOLUS
INTRAVENOUS | Status: DC | PRN
  Administered 2017-11-29: 100 mg via INTRAVENOUS

## 2017-11-29 MED ORDER — OXYCODONE HCL 5 MG PO TABS
5.0000 mg | ORAL_TABLET | ORAL | Status: DC | PRN
  Administered 2017-12-01 – 2017-12-07 (×11): 5 mg via ORAL
  Filled 2017-11-29 (×13): qty 1

## 2017-11-29 MED ORDER — AMLODIPINE BESYLATE 10 MG PO TABS
10.0000 mg | ORAL_TABLET | Freq: Every day | ORAL | Status: DC
  Administered 2017-12-02 – 2017-12-07 (×6): 10 mg via ORAL
  Filled 2017-11-29 (×8): qty 1

## 2017-11-29 MED ORDER — PHENYLEPHRINE 40 MCG/ML (10ML) SYRINGE FOR IV PUSH (FOR BLOOD PRESSURE SUPPORT)
PREFILLED_SYRINGE | INTRAVENOUS | Status: AC
  Filled 2017-11-29: qty 10

## 2017-11-29 MED ORDER — SODIUM CHLORIDE 0.9 % IV SOLN
INTRAVENOUS | Status: DC | PRN
  Administered 2017-11-29: 500 mL

## 2017-11-29 MED ORDER — SODIUM CHLORIDE 0.9% FLUSH
3.0000 mL | Freq: Two times a day (BID) | INTRAVENOUS | Status: DC
  Administered 2017-12-01 – 2017-12-07 (×12): 3 mL via INTRAVENOUS

## 2017-11-29 MED ORDER — SODIUM CHLORIDE 0.9 % IV SOLN: 250.0000 mL | INTRAVENOUS | Status: DC

## 2017-11-29 MED ORDER — METRONIDAZOLE 0.75 % EX GEL: 1.0000 "application " | Freq: Every day | CUTANEOUS | Status: DC | PRN

## 2017-11-29 MED ORDER — PHENYLEPHRINE 40 MCG/ML (10ML) SYRINGE FOR IV PUSH (FOR BLOOD PRESSURE SUPPORT)
PREFILLED_SYRINGE | INTRAVENOUS | Status: DC | PRN
  Administered 2017-11-29 (×2): 80 ug via INTRAVENOUS

## 2017-11-29 MED ORDER — SODIUM CHLORIDE 0.9 % IV SOLN
INTRAVENOUS | Status: DC
  Administered 2017-11-29: 1000 mL via INTRAVENOUS
  Administered 2017-11-30 – 2017-12-01 (×2): via INTRAVENOUS
  Administered 2017-12-01: 1000 mL via INTRAVENOUS

## 2017-11-29 MED ORDER — LIDOCAINE 2% (20 MG/ML) 5 ML SYRINGE
INTRAMUSCULAR | Status: DC | PRN
  Administered 2017-11-29: 60 mg via INTRAVENOUS

## 2017-11-29 MED ORDER — FENTANYL CITRATE (PF) 100 MCG/2ML IJ SOLN
INTRAMUSCULAR | Status: DC | PRN
  Administered 2017-11-29 (×3): 50 ug via INTRAVENOUS

## 2017-11-29 MED ORDER — ONDANSETRON HCL 4 MG/2ML IJ SOLN
4.0000 mg | Freq: Four times a day (QID) | INTRAMUSCULAR | Status: DC | PRN
  Administered 2017-11-29 (×2): 4 mg via INTRAVENOUS
  Filled 2017-11-29 (×2): qty 2

## 2017-11-29 MED ORDER — PROPOFOL 10 MG/ML IV BOLUS
INTRAVENOUS | Status: AC
  Filled 2017-11-29: qty 20

## 2017-11-29 MED ORDER — THROMBIN 5000 UNITS EX SOLR
OROMUCOSAL | Status: DC | PRN
  Administered 2017-11-29: 16:00:00

## 2017-11-29 MED ORDER — SODIUM CHLORIDE 0.9 % IV SOLN
INTRAVENOUS | Status: DC | PRN
  Administered 2017-11-29: 60 ug/min via INTRAVENOUS

## 2017-11-29 MED ORDER — ROCURONIUM BROMIDE 50 MG/5ML IV SOSY
PREFILLED_SYRINGE | INTRAVENOUS | Status: DC | PRN
  Administered 2017-11-29: 50 mg via INTRAVENOUS
  Administered 2017-11-29 (×4): 10 mg via INTRAVENOUS

## 2017-11-29 MED ORDER — ACETAMINOPHEN 650 MG RE SUPP: 650.0000 mg | RECTAL | Status: DC | PRN

## 2017-11-29 MED ORDER — HYDROMORPHONE HCL 1 MG/ML IJ SOLN: 0.2500 mg | INTRAMUSCULAR | Status: DC | PRN

## 2017-11-29 MED ORDER — LIDOCAINE-EPINEPHRINE 1 %-1:100000 IJ SOLN
INTRAMUSCULAR | Status: DC | PRN
  Administered 2017-11-29: 7 mL

## 2017-11-29 MED ORDER — ALBUMIN HUMAN 5 % IV SOLN
INTRAVENOUS | Status: DC | PRN
  Administered 2017-11-29: 18:00:00 via INTRAVENOUS

## 2017-11-29 MED ORDER — SODIUM CHLORIDE 0.9 % IV SOLN
INTRAVENOUS | Status: AC
  Administered 2017-11-29: 05:00:00 via INTRAVENOUS

## 2017-11-29 MED ORDER — LIDOCAINE 2% (20 MG/ML) 5 ML SYRINGE
INTRAMUSCULAR | Status: AC
  Filled 2017-11-29: qty 5

## 2017-11-29 MED ORDER — SODIUM CHLORIDE 0.9% FLUSH: 3.0000 mL | INTRAVENOUS | Status: DC | PRN

## 2017-11-29 MED ORDER — HYDROMORPHONE HCL 1 MG/ML IJ SOLN
0.5000 mg | INTRAMUSCULAR | Status: DC | PRN
  Administered 2017-11-29 – 2017-12-04 (×6): 0.5 mg via INTRAVENOUS
  Filled 2017-11-29 (×6): qty 0.5

## 2017-11-29 MED ORDER — LIDOCAINE-EPINEPHRINE 1 %-1:100000 IJ SOLN
INTRAMUSCULAR | Status: AC
  Filled 2017-11-29: qty 1

## 2017-11-29 MED ORDER — TRIAMCINOLONE ACETONIDE 0.1 % EX CREA: 1.0000 "application " | TOPICAL_CREAM | Freq: Every day | CUTANEOUS | Status: DC | PRN

## 2017-11-29 MED ORDER — POLYETHYLENE GLYCOL 3350 17 G PO PACK
17.0000 g | PACK | Freq: Every day | ORAL | Status: DC | PRN
  Administered 2017-12-03 – 2017-12-06 (×3): 17 g via ORAL
  Filled 2017-11-29 (×3): qty 1

## 2017-11-29 MED ORDER — OXYCODONE HCL 5 MG PO TABS
10.0000 mg | ORAL_TABLET | ORAL | Status: DC | PRN
  Administered 2017-11-29 – 2017-12-07 (×24): 10 mg via ORAL
  Filled 2017-11-29 (×25): qty 2

## 2017-11-29 MED ORDER — DEXAMETHASONE SODIUM PHOSPHATE 10 MG/ML IJ SOLN
INTRAMUSCULAR | Status: DC | PRN
  Administered 2017-11-29: 10 mg via INTRAVENOUS

## 2017-11-29 MED ORDER — PHENOL 1.4 % MT LIQD: 1.0000 | OROMUCOSAL | Status: DC | PRN

## 2017-11-29 MED ORDER — MENTHOL 3 MG MT LOZG
1.0000 | LOZENGE | OROMUCOSAL | Status: DC | PRN
  Administered 2017-11-30: 3 mg via ORAL
  Filled 2017-11-29 (×2): qty 9

## 2017-11-29 MED ORDER — THROMBIN (RECOMBINANT) 5000 UNITS EX SOLR
CUTANEOUS | Status: AC
  Filled 2017-11-29: qty 5000

## 2017-11-29 MED ORDER — ONDANSETRON HCL 4 MG/2ML IJ SOLN
INTRAMUSCULAR | Status: AC
  Filled 2017-11-29: qty 2

## 2017-11-29 MED ORDER — FENTANYL CITRATE (PF) 250 MCG/5ML IJ SOLN
INTRAMUSCULAR | Status: AC
  Filled 2017-11-29: qty 5

## 2017-11-29 SURGICAL SUPPLY — 57 items
BAG DECANTER FOR FLEXI CONT (MISCELLANEOUS) ×3 IMPLANT
BLADE CLIPPER SURG (BLADE) IMPLANT
BLADE SURG 11 STRL SS (BLADE) ×3 IMPLANT
BUR MATCHSTICK NEURO 3.0 LAGG (BURR) ×3 IMPLANT
CANISTER SUCT 3000ML PPV (MISCELLANEOUS) ×3 IMPLANT
COVER WAND RF STERILE (DRAPES) ×3 IMPLANT
DECANTER SPIKE VIAL GLASS SM (MISCELLANEOUS) ×3 IMPLANT
DERMABOND ADVANCED (GAUZE/BANDAGES/DRESSINGS) ×2
DERMABOND ADVANCED .7 DNX12 (GAUZE/BANDAGES/DRESSINGS) ×1 IMPLANT
DRAIN JACKSON RD 7FR 3/32 (WOUND CARE) ×3 IMPLANT
DRAPE C-ARM 42X72 X-RAY (DRAPES) ×6 IMPLANT
DRAPE HALF SHEET 40X57 (DRAPES) IMPLANT
DRAPE LAPAROTOMY 100X72 PEDS (DRAPES) ×3 IMPLANT
DRAPE MICROSCOPE LEICA (MISCELLANEOUS) ×3 IMPLANT
DURAPREP 6ML APPLICATOR 50/CS (WOUND CARE) ×3 IMPLANT
ELECT COATED BLADE 2.86 ST (ELECTRODE) ×3 IMPLANT
ELECT REM PT RETURN 9FT ADLT (ELECTROSURGICAL) ×3
ELECTRODE REM PT RTRN 9FT ADLT (ELECTROSURGICAL) ×1 IMPLANT
FLOSEAL 5ML (HEMOSTASIS) ×3 IMPLANT
GAUZE 4X4 16PLY RFD (DISPOSABLE) IMPLANT
GLOVE BIO SURGEON STRL SZ 6.5 (GLOVE) ×6 IMPLANT
GLOVE BIO SURGEON STRL SZ7.5 (GLOVE) ×3 IMPLANT
GLOVE BIO SURGEONS STRL SZ 6.5 (GLOVE) ×3
GLOVE BIOGEL PI IND STRL 7.0 (GLOVE) ×2 IMPLANT
GLOVE BIOGEL PI IND STRL 7.5 (GLOVE) ×3 IMPLANT
GLOVE BIOGEL PI INDICATOR 7.0 (GLOVE) ×4
GLOVE BIOGEL PI INDICATOR 7.5 (GLOVE) ×6
GLOVE ECLIPSE 7.0 STRL STRAW (GLOVE) ×3 IMPLANT
GOWN STRL REUS W/ TWL LRG LVL3 (GOWN DISPOSABLE) ×4 IMPLANT
GOWN STRL REUS W/ TWL XL LVL3 (GOWN DISPOSABLE) IMPLANT
GOWN STRL REUS W/TWL 2XL LVL3 (GOWN DISPOSABLE) IMPLANT
GOWN STRL REUS W/TWL LRG LVL3 (GOWN DISPOSABLE) ×8
GOWN STRL REUS W/TWL XL LVL3 (GOWN DISPOSABLE)
HEMOSTAT POWDER SURGIFOAM 1G (HEMOSTASIS) ×3 IMPLANT
KIT BASIN OR (CUSTOM PROCEDURE TRAY) ×3 IMPLANT
KIT TURNOVER KIT B (KITS) ×3 IMPLANT
NEEDLE HYPO 22GX1.5 SAFETY (NEEDLE) ×3 IMPLANT
NEEDLE SPNL 18GX3.5 QUINCKE PK (NEEDLE) ×3 IMPLANT
NS IRRIG 1000ML POUR BTL (IV SOLUTION) ×3 IMPLANT
PACK LAMINECTOMY NEURO (CUSTOM PROCEDURE TRAY) ×3 IMPLANT
PAD ARMBOARD 7.5X6 YLW CONV (MISCELLANEOUS) ×9 IMPLANT
PIN DISTRACTION 14MM (PIN) ×6 IMPLANT
PLATE 37MM ×3 IMPLANT
RUBBERBAND STERILE (MISCELLANEOUS) ×6 IMPLANT
SCREW SELF TAP VAR 4.0X13 (Screw) ×18 IMPLANT
SPACER BONE CORNERSTONE 5X14 (Orthopedic Implant) ×3 IMPLANT
SPACER BONE CORNERSTONE 6X14 (Orthopedic Implant) ×3 IMPLANT
SPONGE INTESTINAL PEANUT (DISPOSABLE) ×6 IMPLANT
SPONGE SURGIFOAM ABS GEL SZ50 (HEMOSTASIS) IMPLANT
STAPLER VISISTAT 35W (STAPLE) IMPLANT
SUT MNCRL AB 3-0 PS2 18 (SUTURE) ×3 IMPLANT
SUT VIC AB 3-0 SH 8-18 (SUTURE) ×3 IMPLANT
TAPE CLOTH 3X10 TAN LF (GAUZE/BANDAGES/DRESSINGS) ×3 IMPLANT
TIP KERRISON THIN FOOTPLATE 1M (MISCELLANEOUS) ×3 IMPLANT
TOWEL GREEN STERILE (TOWEL DISPOSABLE) ×3 IMPLANT
TOWEL GREEN STERILE FF (TOWEL DISPOSABLE) ×3 IMPLANT
WATER STERILE IRR 1000ML POUR (IV SOLUTION) ×3 IMPLANT

## 2017-11-29 NOTE — Progress Notes (Addendum)
On arrival to Short Stay patient reports being married since last visit at [facility]. Beryl Meager was former last name. I requested that name be changed at patient request. Patient also states she did not want to be a confidential patient. I requested that registration remove this as well.

## 2017-11-29 NOTE — Progress Notes (Signed)
Pt ready to depart from pacu ,awaiting transport

## 2017-11-29 NOTE — ED Notes (Signed)
Pt taken to MRI  

## 2017-11-29 NOTE — Brief Op Note (Signed)
11/29/2017  7:01 PM  PATIENT:  Dayne A Spelman  83 y.o. female  PRE-OPERATIVE DIAGNOSIS:  Unstable cervical spine ligamentous injury  POST-OPERATIVE DIAGNOSIS:  Unstable cervical spine ligamentous injury  PROCEDURE:  Procedure(s) with comments: Cervical five-six, six-seven Anterior Cervical Decompression Fusion (N/A) - Cervical five-six, six-seven Anterior Cervical Decompression Fusion  SURGEON:  Surgeon(s) and Role:    * Levelle Edelen, Joyice Faster, MD - Primary    * Consuella Lose, MD - Assisting  ANESTHESIA:   general  EBL:  300 mL   BLOOD ADMINISTERED:none  DRAINS: #7 round drain in subplatysmal space   LOCAL MEDICATIONS USED:  BUPIVICAINE   SPECIMEN:  No Specimen  DISPOSITION OF SPECIMEN:  N/A  COUNTS:  YES  TOURNIQUET:  * No tourniquets in log *  DICTATION: .Note written in EPIC  PLAN OF CARE: Admit to inpatient   PATIENT DISPOSITION:  PACU - hemodynamically stable.   Delay start of Pharmacological VTE agent (>24hrs) due to surgical blood loss or risk of bleeding: yes

## 2017-11-29 NOTE — ED Notes (Signed)
Pt returned from MRI; able to lift both arms up and flex, difficulty extending, unable to grip; continues having involuntary movement to lower limbs, no sensation. Bruising noted to R eye

## 2017-11-29 NOTE — ED Provider Notes (Signed)
I received a call from radiology concerning significant MRI findings in this patient.  I relayed these findings to Dr. Onalee Hua via the phone.  He is aware of findings.  Patient will be admitted   Ripley Fraise, MD 11/29/17 [phone]

## 2017-11-29 NOTE — ED Notes (Signed)
[name] [phone] (daughter) please call with any updates

## 2017-11-29 NOTE — Transfer of Care (Signed)
Immediate Anesthesia Transfer of Care Note  Patient: Sherry [name]  Procedure(s) Performed: Cervical five-six, six-seven Anterior Cervical Decompression Fusion (N/A Spine Cervical)  Patient Location: PACU  Anesthesia Type:General  Level of Consciousness: awake, alert , oriented and patient cooperative  Airway & Oxygen Therapy: Patient Spontanous Breathing and Patient connected to nasal cannula oxygen  Post-op Assessment: Report given to RN and Post -op Vital signs reviewed and stable  Post vital signs: Reviewed and stable  Last Vitals:  Vitals Value Taken Time  BP 99/59 11/29/2017  7:07 PM  Temp 36.3 C 11/29/2017  7:07 PM  Pulse 75 11/29/2017  7:12 PM  Resp 15 11/29/2017  7:12 PM  SpO2 95 % 11/29/2017  7:12 PM  Vitals shown include unvalidated device data.  Last Pain:  Vitals:   11/29/17 1907  TempSrc:   PainSc: 0-No pain      Patients Stated Pain Goal: 0 (74/[id number])  Complications: No apparent anesthesia complications

## 2017-11-29 NOTE — ED Notes (Signed)
Sherry [name], daughter, updated on plan of care. Daughter out of state with plans to fly home tomorrow.

## 2017-11-29 NOTE — Anesthesia Preprocedure Evaluation (Addendum)
Anesthesia Evaluation  Patient identified by MRN, date of birth, ID band Patient awake    Reviewed: Allergy & Precautions, H&P , NPO status , Patient's Chart, lab work & pertinent test results  Airway Mallampati: III  TM Distance: >3 FB Neck ROM: Full    Dental no notable dental hx. (+) Teeth Intact, Dental Advisory Given   Pulmonary neg pulmonary ROS,    Pulmonary exam normal breath sounds clear to auscultation       Cardiovascular hypertension, Pt. on medications  Rhythm:Regular Rate:Normal     Neuro/Psych negative neurological ROS  negative psych ROS   GI/Hepatic negative GI ROS, Neg liver ROS,   Endo/Other  negative endocrine ROS  Renal/GU negative Renal ROS  negative genitourinary   Musculoskeletal  (+) Arthritis , Osteoarthritis,    Abdominal   Peds  Hematology negative hematology ROS (+)   Anesthesia Other Findings   Reproductive/Obstetrics negative OB ROS                            Anesthesia Physical Anesthesia Plan  ASA: II  Anesthesia Plan: General   Post-op Pain Management:    Induction: Intravenous  PONV Risk Score and Plan: 4 or greater and Ondansetron, Dexamethasone and Treatment may vary due to age or medical condition  Airway Management Planned: Oral ETT and Video Laryngoscope Planned  Additional Equipment:   Intra-op Plan:   Post-operative Plan: Extubation in OR  Informed Consent: I have reviewed the patients History and Physical, chart, labs and discussed the procedure including the risks, benefits and alternatives for the proposed anesthesia with the patient or authorized representative who has indicated his/her understanding and acceptance.   Dental advisory given  Plan Discussed with: CRNA  Anesthesia Plan Comments:        Anesthesia Quick Evaluation

## 2017-11-29 NOTE — Op Note (Signed)
PATIENT: Sherry [name]  DATE:11/29/17   PRE-OPERATIVE DIAGNOSIS:  Unstable cervical spine injury   POST-OPERATIVE DIAGNOSIS:  Unstable cervical spine injury, closed cervical spine fracture   PROCEDURE:  C5-6/C6-C7 Anterior Cervical Discectomy and Instrumented Fusion   SURGEON:  Surgeon(s) and Role:    Etienne Mowers, Joyice Faster, MD - Primary    Consuella Lose, MD - Assisting   ANESTHESIA: ETGA   BRIEF HISTORY: This is an 83yo woman who presented after a fall from standing with quadriplegia followed by slow improvement, consistent with central cord syndrome. She also had a severe thoracic spine fracture. 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 This was discussed with the patient as well as risks, benefits, and alternatives and the patient wished to proceed with surgical treatment.   OPERATIVE DETAIL: The patient was taken to the operating room and placed on the OR table in the supine position. A formal time out was performed with two patient identifiers and confirmed the operative site. Anesthesia was induced by the anesthesia team.  Fluoroscopy was used to localize the surgical level and an incision was marked in a skin crease. The area was then prepped and draped in a sterile fashion. A transverse linear incision was made on the right side of the neck. The platysma was divided medial to the sternocleidomastoid muscle. The carotid sheath was palpated, identified, and retracted laterally with the sternocleidomastoid muscle. The strap muscles were identified and retracted medially and the pretracheal fascia was entered. A bent spinal needle was used with fluoroscopy to localize the surgical level after dissection.   While dissecting the longus colli, a calcified disc fragment at C6-7 elevated with the longus  spontaneously, attached to fractured portions of C6 and C7 vertebral bodies. The longus colli were further elevated bilaterally and a C6-7 discectomy was performed with bilateral foraminotomies. After thorough decompression of the bilateral foramina was obtained without any palpable evidence of residual stenosis, a 48mm cortical allograft ([employer]) was inserted into the disc space as an interbody graft. This was then repeated at C5-6, which was a severely narrowed disc space. At both levels, posterior osteophytes and ligament were removed to maximally decompress the thecal sac. An anterior plate ([employer]tr[employer]) was then positioned and six 1mm screws were used to secure the plate to the C5, C6, and C7 vertebral bodies. Hemostasis was obtained and the incision was closed in layers. Of note, due to the fracture and the patient's osteoporosis, there was significant bony bleeding in the vertebral bodies. This improved after multiple rounds of hemostatic agents and a #7 round drain was placed sub-platysmal and secured in case any of the bony bleeding recurred as the patient was moving during emergence.   All instrument and sponge counts were correct and the incision was closed in layers. The patient was then returned to anesthesia for emergence. No apparent complications at the completion of the procedure.   EBL:  310mL   DRAINS: #7 round drain with JP bulb   SPECIMENS: none   Judith Part, MD 11/29/17 7:03 PM

## 2017-11-29 NOTE — Progress Notes (Signed)
RN verified the presence of a signed informed consent that matches stated procedure by patient. Verified armband matches patient's stated name and birth date. Verified NPO status and that all jewelry, contact, glasses, dentures, and partials had been removed (if applicable).  

## 2017-11-29 NOTE — H&P (Signed)
Neurosurgery H&P  CC: Weakness  HPI: This is a 83 y.o. woman s/p fall with immediate quadriplegia. She has since slowly regained strength in all four extremities, but has no proprioception in the BLE so she was unaware that she has LE function. She does endorse parasthesias diffusely in BUE but no dysesthesias yet. She is amnestic to the event and force vector for her fall, likely fall ventral contact point and cervical extension.    ROS: A 14 point ROS was performed and is negative except as noted in the HPI.   PMHx:      Past Medical History:  Diagnosis Date  . Arthritis   . Hypertension    FamHx: No family history on file. SocHx:  reports that she has never smoked. She has never used smokeless tobacco. She reports that she does not drink alcohol or use drugs.  Exam: Vital signs in last 24 hours: Temp:  [97.6 F (36.4 C)] 97.6 F (36.4 C) (10/28 2136) Pulse Rate:  [64] 64 (10/28 2136) Resp:  [16] 16 (10/28 2136) BP: (106)/(68) 106/68 (10/28 2136) SpO2:  [90 %-98 %] 98 % (10/28 2157) Weight:  [78.9 kg] 78.9 kg (10/28 2153) General: Awake, alert, cooperative, lying in bed in NAD Head: scattered small lacerations, normocephalic HEENT: wearing rigid cervical collar Pulmonary: breathing room air comfortably, no evidence of increased work of breathing Cardiac: RRR Abdomen: S NT ND Extremities: warm and well perfused x4 Neuro: AOx3, PERRL, EOMI, FS Strength: RUE: D 4/5, B 4/5, WE 3/5, T 2/5, G 1/5 LUE: D 4/5, B 4/5, WE 3/5, T 2/5, G 1/5 RLE: HF 4-/5, KE 4-/5, DF 4-/5, EHL 4-/5, PF 4-/5 LLE: HF 4-/5, KE 4-/5, DF 4-/5, EHL 4-/5, PF 4-/5 Mid-thoracic sensory level with complete loss of proprioception and temperature / fine touch   Assessment and Plan: 83 y.o. woman s/p fall with immediate quadriplegia followed by gradual improvement. CT C-spine personally reviewed, which shows spondylosis but no obvious fracture or dislocation, retro-odontoid pannus, . Clinical exam  and history consistent with central cord syndrome. CT T-spine shows complex fracture at T11, likely acute but part of the margins are sclerotic. MRI C-spine showed disruption of ALL and increased signal at the level of the C6-7 disc, spinal cord edema at C5-6, consistent with central cord syndrome from an unstable spine injury, incidental note of pannus previously seen on CT.  -OR for 2 level ACDF -4NP post-op  Judith Part, MD 11/28/17

## 2017-11-29 NOTE — Anesthesia Procedure Notes (Signed)
Procedure Name: Intubation Date/Time: 11/29/2017 3:24 PM Performed by: Orlie Dakin, CRNA Pre-anesthesia Checklist: Patient identified, Emergency Drugs available, Suction available and Patient being monitored Patient Re-evaluated:Patient Re-evaluated prior to induction Oxygen Delivery Method: Circle system utilized Preoxygenation: Pre-oxygenation with 100% oxygen Induction Type: IV induction Ventilation: Mask ventilation without difficulty Laryngoscope Size: Glidescope and 4 Grade View: Grade I Tube type: Oral Tube size: 7.0 mm Number of attempts: 1 Airway Equipment and Method: Stylet and Video-laryngoscopy Placement Confirmation: ETT inserted through vocal cords under direct vision,  positive ETCO2 and breath sounds checked- equal and bilateral Secured at: 22 cm Tube secured with: Tape Dental Injury: Teeth and Oropharynx as per pre-operative assessment  Comments: Glidescope used due to instability of neck.  Front of hard collar removed for DL.   Minimal neck movement with DL.  Front of hard collar replaced immediately after intubation.  4x4s bite block used prior to extubation.

## 2017-11-29 NOTE — ED Notes (Signed)
Pt c/o increased pins and needle feeling in bilateral arms; NS paged regarding pain management

## 2017-11-29 NOTE — Anesthesia Postprocedure Evaluation (Signed)
Anesthesia Post Note  Patient: Sherry [name]  Procedure(s) Performed: Cervical five-six, six-seven Anterior Cervical Decompression Fusion (N/A Spine Cervical)     Patient location during evaluation: PACU Anesthesia Type: General Level of consciousness: awake and alert, oriented and patient cooperative Pain management: pain level controlled Vital Signs Assessment: post-procedure vital signs reviewed and stable Respiratory status: spontaneous breathing, nonlabored ventilation, respiratory function stable and patient connected to nasal cannula oxygen Cardiovascular status: blood pressure returned to baseline and stable Postop Assessment: no apparent nausea or vomiting Anesthetic complications: no    Last Vitals:  Vitals:   11/29/17 1930 11/29/17 2026  BP:  103/61  Pulse: 73 75  Resp: 12 15  Temp: (!) 36.3 C 37.5 C  SpO2: 97% 94%    Last Pain:  Vitals:   11/29/17 2026  TempSrc: Axillary  PainSc:                  Ryo Klang,E. Maraya Gwilliam

## 2017-11-29 NOTE — ED Notes (Signed)
Mouth swab given to patient as requested. Call bell at arms-reach, Family at bedside.

## 2017-11-30 ENCOUNTER — Other Ambulatory Visit: Payer: Self-pay

## 2017-11-30 MED ORDER — CYCLOBENZAPRINE HCL 10 MG PO TABS
10.0000 mg | ORAL_TABLET | Freq: Three times a day (TID) | ORAL | Status: DC | PRN
  Administered 2017-11-30 – 2017-12-04 (×9): 10 mg via ORAL
  Filled 2017-11-30 (×9): qty 1

## 2017-11-30 MED ORDER — GABAPENTIN 100 MG PO CAPS
100.0000 mg | ORAL_CAPSULE | Freq: Three times a day (TID) | ORAL | Status: DC
  Administered 2017-11-30 – 2017-12-05 (×17): 100 mg via ORAL
  Filled 2017-11-30 (×17): qty 1

## 2017-11-30 MED ORDER — LORATADINE 10 MG PO TABS
10.0000 mg | ORAL_TABLET | Freq: Every day | ORAL | Status: DC
  Administered 2017-11-30 – 2017-12-07 (×8): 10 mg via ORAL
  Filled 2017-11-30 (×8): qty 1

## 2017-11-30 MED FILL — Thrombin (Recombinant) For Soln 5000 Unit: CUTANEOUS | Qty: 5000 | Status: AC

## 2017-11-30 NOTE — Progress Notes (Signed)
Orthopedic Tech Progress Note Patient Details:  Sherry [name] 11-18-1934 [id number]  Patient ID: [name], female   DOB: 1934-07-10, 83 y.o.   MRN: [id number]   [name] 11/30/2017, 10:14 AM Called in bio-tech brace order; spoke with Bella Kennedy

## 2017-11-30 NOTE — Progress Notes (Addendum)
Neurosurgery Service Progress Note  Subjective: No acute events overnight, starting to have some low thoracic pain as her sensation returns   Objective: Vitals:   11/29/17 2026 11/29/17 2342 11/30/17 0352 11/30/17 0722  BP: 103/61 (!) 102/56 (!) 104/56 109/60  Pulse: 75 77 79 79  Resp: 15 16 16 18   Temp: 99.5 F (37.5 C) 99.1 F (37.3 C) (!) 100.7 F (38.2 C) 98 F (36.7 C)  TempSrc: Axillary Oral Axillary Oral  SpO2: 94% 96% 100% 95%  Weight:      Height:       Temp (24hrs), Avg:98.6 F (37 C), Min:97.4 F (36.3 C), Max:100.7 F (38.2 C)  CBC Latest Ref Rng & Units 11/28/2017 11/28/2017  WBC 4.0 - 10.5 K/uL 8.9 -  Hemoglobin 12.0 - 15.0 g/dL 12.0 12.6  Hematocrit 36.0 - 46.0 % 38.5 37.0  Platelets 150 - 400 K/uL 232 -   BMP Latest Ref Rng & Units 11/28/2017 11/28/2017  Glucose 70 - 99 mg/dL 138(H) 134(H)  BUN 8 - 23 mg/dL 21 22  Creatinine 0.44 - 1.00 mg/dL 0.88 0.90  Sodium 135 - 145 mmol/L 138 140  Potassium 3.5 - 5.1 mmol/L 3.8 3.8  Chloride 98 - 111 mmol/L 108 104  CO2 22 - 32 mmol/L 24 -  Calcium 8.9 - 10.3 mg/dL 8.7(L) -    Intake/Output Summary (Last 24 hours) at 11/30/2017 1033 Last data filed at 11/29/2017 1930 Gross per 24 hour  Intake 1450 ml  Output 902 ml  Net 548 ml    Current Facility-Administered Medications:  .  0.9 %  sodium chloride infusion, 250 mL, Intravenous, Continuous, Rafaella Kole, Joyice Faster, MD, Stopped at 11/29/17 [phone] .  0.9 %  sodium chloride infusion, , Intravenous, Continuous, Lavern Maslow, Joyice Faster, MD, Last Rate: 75 mL/hr at 11/29/17 2252, 1,000 mL at 11/29/17 2252 .  acetaminophen (TYLENOL) tablet 650 mg, 650 mg, Oral, Q4H PRN **OR** acetaminophen (TYLENOL) suppository 650 mg, 650 mg, Rectal, Q4H PRN, Lynnzie Blackson A, MD .  amLODipine (NORVASC) tablet 10 mg, 10 mg, Oral, QPC supper, Dezirea Mccollister A, MD .  HYDROmorphone (DILAUDID) injection 0.5 mg, 0.5 mg, Intravenous, Q3H PRN, Judith Part, MD, 0.5 mg at 11/29/17  1118 .  lactated ringers infusion, , Intravenous, Continuous, Muhammad Vacca, Joyice Faster, MD, Last Rate: 0 mL/hr at 11/29/17 1735 .  loratadine (CLARITIN) tablet 10 mg, 10 mg, Oral, Daily, Jarmel Linhardt, Joyice Faster, MD .  menthol-cetylpyridinium (CEPACOL) lozenge 3 mg, 1 lozenge, Oral, PRN, 3 mg at 11/30/17 0613 **OR** phenol (CHLORASEPTIC) mouth spray 1 spray, 1 spray, Mouth/Throat, PRN, Shanae Luo A, MD .  metroNIDAZOLE (METROGEL) 4.13 % gel 1 application, 1 application, Topical, Daily PRN, Vallerie Hentz A, MD .  ondansetron (ZOFRAN) tablet 4 mg, 4 mg, Oral, Q6H PRN **OR** ondansetron (ZOFRAN) injection 4 mg, 4 mg, Intravenous, Q6H PRN, Judith Part, MD, 4 mg at 11/29/17 1331 .  oxyCODONE (Oxy IR/ROXICODONE) immediate release tablet 10 mg, 10 mg, Oral, Q3H PRN, Judith Part, MD, 10 mg at 11/30/17 0908 .  oxyCODONE (Oxy IR/ROXICODONE) immediate release tablet 5 mg, 5 mg, Oral, Q3H PRN, Judith Part, MD .  polyethylene glycol (MIRALAX / GLYCOLAX) packet 17 g, 17 g, Oral, Daily PRN, Blossom Crume A, MD .  sodium chloride flush (NS) 0.9 % injection 3 mL, 3 mL, Intravenous, Q12H, Levenia Skalicky A, MD .  sodium chloride flush (NS) 0.9 % injection 3 mL, 3 mL, Intravenous, PRN, Judith Part, MD .  triamcinolone cream (  KENALOG) 0.1 % 1 application, 1 application, Topical, Daily PRN, Judith Part, MD   Physical Exam: AOx3, PERRL, EOMI, FS, TM,  Strength 4-/5 in BUE, 4-/5 in BLE C4 sensory level, intact temperature, no propioception Incision c/d/i  Assessment & Plan: 83 y.o. woman s/p fall with cervical extension injury and central cord syndrome, 10/29 s/p C5-6/6-7 ACDF, recovering well. Other injuries include T11 coronal VB fracture. -TLSO for T11 fracture. If pt is able to bear weight without significant pain, will get upright Xrays and see if fracture can be managed non-operatively. Will have to monitor this as her sensation returns -PT/OT, can be OOB as  tolerated with TLSO brace on -d/c C-collar -continue anterior cervical drain, likely will remove it tomorrow morning -SCDs/TEDs, [facility] tomorrow   Judith Part  11/30/17 10:33 AM

## 2017-11-30 NOTE — Progress Notes (Signed)
CSW acknowledging consult for "SNF vs CIR". However, at this time, no orders for PT or OT evaluations. Please place orders for therapy evaluations if patient is appropriate for placement.  CSW will follow after therapy evaluations complete.  Laveda Abbe, [place] Clinical Social Worker [phone]

## 2017-11-30 NOTE — Progress Notes (Signed)
I have asked the patient to let me reposition her 3 times today but she states she is comfortable and she has enough cushion under her bottom. I educated her and still insisted on staying on her back.

## 2017-12-01 ENCOUNTER — Inpatient Hospital Stay (HOSPITAL_COMMUNITY): Payer: Medicare HMO

## 2017-12-01 ENCOUNTER — Encounter (HOSPITAL_COMMUNITY): Payer: Self-pay | Admitting: Neurological Surgery

## 2017-12-01 DIAGNOSIS — N319 Neuromuscular dysfunction of bladder, unspecified: Secondary | ICD-10-CM

## 2017-12-01 DIAGNOSIS — K592 Neurogenic bowel, not elsewhere classified: Secondary | ICD-10-CM

## 2017-12-01 DIAGNOSIS — S14129A Central cord syndrome at unspecified level of cervical spinal cord, initial encounter: Secondary | ICD-10-CM

## 2017-12-01 MED ORDER — SALINE SPRAY 0.65 % NA SOLN
1.0000 | NASAL | Status: DC | PRN
  Administered 2017-12-03: 1 via NASAL
  Filled 2017-12-01: qty 44

## 2017-12-01 MED ORDER — HEPARIN SODIUM (PORCINE) 5000 UNIT/ML IJ SOLN
5000.0000 [IU] | Freq: Three times a day (TID) | INTRAMUSCULAR | Status: DC
  Administered 2017-12-01 – 2017-12-04 (×9): 5000 [IU] via SUBCUTANEOUS
  Filled 2017-12-01 (×10): qty 1

## 2017-12-01 MED ORDER — ADULT MULTIVITAMIN LIQUID CH
15.0000 mL | Freq: Every day | ORAL | Status: DC
  Administered 2017-12-01 – 2017-12-07 (×7): 15 mL via ORAL
  Filled 2017-12-01 (×7): qty 15

## 2017-12-01 MED ORDER — SALINE SPRAY 0.65 % NA SOLN
1.0000 | NASAL | Status: DC | PRN
  Administered 2017-12-01: 1 via NASAL
  Filled 2017-12-01: qty 44

## 2017-12-01 MED ORDER — GUAIFENESIN-DM 100-10 MG/5ML PO SYRP
5.0000 mL | ORAL_SOLUTION | ORAL | Status: DC | PRN
  Administered 2017-12-01 – 2017-12-06 (×6): 5 mL via ORAL
  Filled 2017-12-01 (×6): qty 5

## 2017-12-01 NOTE — Progress Notes (Signed)
Orthopedic Tech Progress Note Patient Details:  Sherry [name] 03-Oct-1934 [id number]  Ortho Devices Type of Ortho Device: Prafo boot/shoe Ortho Device/Splint Location: bilateral Ortho Device/Splint Interventions: Application   Post Interventions Patient Tolerated: Well Instructions Provided: Care of device   [name] 12/01/2017, 12:15 PM

## 2017-12-01 NOTE — Progress Notes (Signed)
Neurosurgery Service Progress Note  Subjective: No acute events overnight, no new complaints, drain w/ minimal output Objective: Vitals:   11/30/17 2323 12/01/17 0427 12/01/17 0803 12/01/17 1128  BP: (!) 101/53 101/60 (!) 111/59 (!) 97/54  Pulse: 76 71 78 74  Resp: 18 18 18 18   Temp: 99 F (37.2 C) 98.9 F (37.2 C) 98.8 F (37.1 C) 98.6 F (37 C)  TempSrc: Oral Oral Oral Oral  SpO2: 96% 94% 92% 96%  Weight:      Height:       Temp (24hrs), Avg:98.5 F (36.9 C), Min:97.7 F (36.5 C), Max:99 F (37.2 C)  CBC Latest Ref Rng & Units 11/28/2017 11/28/2017  WBC 4.0 - 10.5 K/uL 8.9 -  Hemoglobin 12.0 - 15.0 g/dL 12.0 12.6  Hematocrit 36.0 - 46.0 % 38.5 37.0  Platelets 150 - 400 K/uL 232 -   BMP Latest Ref Rng & Units 11/28/2017 11/28/2017  Glucose 70 - 99 mg/dL 138(H) 134(H)  BUN 8 - 23 mg/dL 21 22  Creatinine 0.44 - 1.00 mg/dL 0.88 0.90  Sodium 135 - 145 mmol/L 138 140  Potassium 3.5 - 5.1 mmol/L 3.8 3.8  Chloride 98 - 111 mmol/L 108 104  CO2 22 - 32 mmol/L 24 -  Calcium 8.9 - 10.3 mg/dL 8.7(L) -    Intake/Output Summary (Last 24 hours) at 12/01/2017 1305 Last data filed at 12/01/2017 0300 Gross per 24 hour  Intake 1544.92 ml  Output 660 ml  Net 884.92 ml    Current Facility-Administered Medications:  .  0.9 %  sodium chloride infusion, 250 mL, Intravenous, Continuous, Ostergard, Joyice Faster, MD, Stopped at 11/29/17 [phone] .  0.9 %  sodium chloride infusion, , Intravenous, Continuous, Ostergard, Joyice Faster, MD, Stopped at 12/01/17 [phone] .  acetaminophen (TYLENOL) tablet 650 mg, 650 mg, Oral, Q4H PRN **OR** acetaminophen (TYLENOL) suppository 650 mg, 650 mg, Rectal, Q4H PRN, Ostergard, Thomas A, MD .  amLODipine (NORVASC) tablet 10 mg, 10 mg, Oral, QPC supper, Ostergard, Thomas A, MD .  cyclobenzaprine (FLEXERIL) tablet 10 mg, 10 mg, Oral, TID PRN, Judith Part, MD, 10 mg at 12/01/17 0148 .  gabapentin (NEURONTIN) capsule 100 mg, 100 mg, Oral, TID, Judith Part,  MD, 100 mg at 12/01/17 0824 .  guaiFENesin-dextromethorphan (ROBITUSSIN DM) 100-10 MG/5ML syrup 5 mL, 5 mL, Oral, Q4H PRN, Ostergard, Thomas A, MD .  HYDROmorphone (DILAUDID) injection 0.5 mg, 0.5 mg, Intravenous, Q3H PRN, Judith Part, MD, 0.5 mg at 11/29/17 1118 .  lactated ringers infusion, , Intravenous, Continuous, Ostergard, Joyice Faster, MD, Last Rate: 0 mL/hr at 11/29/17 1735 .  loratadine (CLARITIN) tablet 10 mg, 10 mg, Oral, Daily, Ostergard, Joyice Faster, MD, 10 mg at 12/01/17 0825 .  menthol-cetylpyridinium (CEPACOL) lozenge 3 mg, 1 lozenge, Oral, PRN, 3 mg at 11/30/17 0613 **OR** phenol (CHLORASEPTIC) mouth spray 1 spray, 1 spray, Mouth/Throat, PRN, Ostergard, Thomas A, MD .  metroNIDAZOLE (METROGEL) 3.53 % gel 1 application, 1 application, Topical, Daily PRN, Judith Part, MD .  multivitamin liquid 15 mL, 15 mL, Oral, Daily, Ostergard, Thomas A, MD, 15 mL at 12/01/17 1242 .  ondansetron (ZOFRAN) tablet 4 mg, 4 mg, Oral, Q6H PRN **OR** ondansetron (ZOFRAN) injection 4 mg, 4 mg, Intravenous, Q6H PRN, Judith Part, MD, 4 mg at 11/29/17 1331 .  oxyCODONE (Oxy IR/ROXICODONE) immediate release tablet 10 mg, 10 mg, Oral, Q3H PRN, Judith Part, MD, 10 mg at 12/01/17 0831 .  oxyCODONE (Oxy IR/ROXICODONE) immediate release tablet 5 mg,  5 mg, Oral, Q3H PRN, Judith Part, MD, 5 mg at 12/01/17 0831 .  polyethylene glycol (MIRALAX / GLYCOLAX) packet 17 g, 17 g, Oral, Daily PRN, Ostergard, Thomas A, MD .  sodium chloride (OCEAN) 0.65 % nasal spray 1 spray, 1 spray, Each Nare, PRN, Judith Part, MD, 1 spray at 12/01/17 1241 .  sodium chloride (OCEAN) 0.65 % nasal spray 1 spray, 1 spray, Each Nare, PRN, Ostergard, Thomas A, MD .  sodium chloride flush (NS) 0.9 % injection 3 mL, 3 mL, Intravenous, Q12H, Ostergard, Joyice Faster, MD, 3 mL at 12/01/17 0825 .  sodium chloride flush (NS) 0.9 % injection 3 mL, 3 mL, Intravenous, PRN, Ostergard, Thomas A, MD .  triamcinolone cream  (KENALOG) 0.1 % 1 application, 1 application, Topical, Daily PRN, Judith Part, MD   Physical Exam: AOx3, PERRL, EOMI, FS, TM,  Strength 4-/5 in BUE, 4-/5 in BLE C4 sensory level, intact temperature, no propioception Incision c/d/i  Assessment & Plan: 83 y.o. woman s/p fall with cervical extension injury and central cord syndrome, 10/29 s/p C5-6/6-7 ACDF, recovering well. Other injuries include T11 coronal VB fracture. -TLSO for T11 fracture -upright Xrays -OOB as tolerated with TLSO brace on -PT/OT rec CIR, PM&R consulted -no C-collar -d/c cervical drain -SCDs/TEDs, [facility]    Thomas A Ostergard  12/01/17 1:05 PM

## 2017-12-01 NOTE — Evaluation (Signed)
Occupational Therapy Evaluation Patient Details Name: EDITHE DOBBIN MRN: [id number] DOB: [birth year] Today's Date: 12/01/2017    History of Present Illness  83yo female s/p fall after which she had immediate quadriplegia, per MD notes has regained some LE function. Pt with no recall of events.  CT of spine was Negative for cervical fracture but does have acute T11 fracture.  MRI of cervical spine showed C6-7 disc fracture and tear of anterior longitudinal ligament, paraspinal muscle and interspinous edema from C1-7 compatible with ligatment injury and muscle strain, likely cord cord contusion C5-6.  Head imaging negative for acute injury/changes. Received ACDF C5-C6, C6-C7 on 11/29/17. Per MD notes/assessment, symptoms consistent with possible central cord syndrome. PMH OA, HTN   Clinical Impression   Pt admitted with above. She demonstrates the below listed deficits and will benefit from continued OT to maximize safety and independence with BADLs.  Pt presents to OT with quadraparesis/central cord syndrome.  She demonstrates UE movement consistent with C6-C7 injury.  See PT note for details of LE strength/movement.  Pt requires total A for all aspects of ADLs and functional mobility.  She was able to sit EOB x 5 mins with max A, BP remained stable.   Pt does not recall events of accident.  Cognition is [facility] for very basic info, but will require further assessment.  Rt beating horizontal nystagmus noted at rest, but does endorse feeling of moving when she is not moving.   PTA, she lived with her spouse, who has cognitive deficits.  She was fully independent.  Recommend CIR.       Follow Up Recommendations  CIR;Supervision/Assistance - 24 hour    Equipment Recommendations  None recommended by OT    Recommendations for Other Services Rehab consult     Precautions / Restrictions Precautions Precautions: Fall;Cervical;Back;Other (comment) Precaution Booklet Issued: No Precaution Comments:  back precautions due to thoracic fracture and ACDF; needs TLSO for all OOB activity but per order set does not need cervical collar  Required Braces or Orthoses: Spinal Brace Spinal Brace: Thoracolumbosacral orthotic;Applied in sitting position Restrictions Weight Bearing Restrictions: No      Mobility Bed Mobility Overal bed mobility: Needs Assistance Bed Mobility: Rolling;Sidelying to Sit;Sit to Sidelying Rolling: Total assist Sidelying to sit: Total assist;+2 for physical assistance;+2 for safety/equipment Supine to sit: +2 for physical assistance;Max assist;+2 for safety/equipment   Sit to sidelying: Total assist;+2 for physical assistance;+2 for safety/equipment General bed mobility comments: Pt instructed in log rolling and requires assist for all aspects   Transfers                 General transfer comment: unable to attempt due to safety     Balance Overall balance assessment: Needs assistance;History of Falls Sitting-balance support: No upper extremity supported;Feet supported Sitting balance-Leahy Scale: Poor Sitting balance - Comments: Pt able to sit EOB x ~5 mins with max A.        Standing balance-Leahy Scale: Zero                 High Level Balance Comments: Pt unable to stand            ADL either performed or assessed with clinical judgement   ADL Overall ADL's : Needs assistance/impaired Eating/Feeding: Total assistance;Bed level   Grooming: Wash/dry hands;Wash/dry face;Oral care;Brushing hair;Total assistance;Bed level   Upper Body Bathing: Total assistance;Bed level   Lower Body Bathing: Total assistance;Bed level   Upper Body Dressing : Total assistance;Bed level  Lower Body Dressing: Total assistance;Bed level   Toilet Transfer: Total assistance Toilet Transfer Details (indicate cue type and reason): unable  Toileting- Clothing Manipulation and Hygiene: Total assistance;Bed level       Functional mobility during ADLs: Total  assistance;+2 for physical assistance General ADL Comments: requires total A for all aspects      Vision Baseline Vision/History: Wears glasses Wears Glasses: At all times Patient Visual Report: No change from baseline Additional Comments: Pt with Rt beating horizontal nystagmus with Rt gaze at rest, but not noticeable during pursuits      Perception Perception Perception Tested?: Yes   Praxis      Pertinent Vitals/Pain Pain Assessment: 0-10 Pain Score: 5  Pain Location: B UEs  Pain Descriptors / Indicators: Aching;Numbness;Tingling Pain Intervention(s): Monitored during session;Limited activity within patient's tolerance     Hand Dominance Right   Extremity/Trunk Assessment Upper Extremity Assessment Upper Extremity Assessment: RUE deficits/detail;LUE deficits/detail RUE Deficits / Details: shoulder 3/5; elbow flexion 3+/5, extension 0/5; wrist flexion and extension 3-/5; hand 0/5.  extensor tighntess noted bil. hands  RUE Sensation: decreased light touch;decreased proprioception RUE Coordination: decreased fine motor;decreased gross motor LUE Deficits / Details: shoulder 3/5; elbow flex 3+/5, extension 0/5; hand 0/5 extensor tighntess noted bil. hands  LUE Sensation: decreased light touch;decreased proprioception LUE Coordination: decreased fine motor;decreased gross motor   Lower Extremity Assessment Lower Extremity Assessment: Defer to PT evaluation RLE Deficits / Details: MMT grossly 2- to 2/5 B LEs  RLE Sensation: decreased light touch;decreased proprioception RLE Coordination: decreased fine motor;decreased gross motor LLE Deficits / Details: MMT grossly 2- to 2/5 B LEs  LLE Sensation: decreased light touch;decreased proprioception LLE Coordination: decreased fine motor;decreased gross motor   Cervical / Trunk Assessment Cervical / Trunk Assessment: Other exceptions Cervical / Trunk Exceptions: Pt with decreased active trunk control.  Unable to fully extend trunk     Communication Communication Communication: No difficulties   Cognition Arousal/Alertness: Awake/alert Behavior During Therapy: WFL for tasks assessed/performed Overall Cognitive Status: Within Functional Limits for tasks assessed                                 General Comments: Cognition only assessed for basic info.  She has no recall of event, and unsure of LOC.  Cognition will need to be further assessed    General Comments  BP supine 115/65; sitting 123/70; sitting after ~5 mins 114/64.   02 sats remained >94% on 2L supplemental     Exercises     Shoulder Instructions      Home Living Family/patient expects to be discharged to:: Private residence Living Arrangements: Spouse/significant other Available Help at Discharge: Family;Available PRN/intermittently Type of Home: House Home Access: Stairs to enter [employer] of Steps: 3 Entrance Stairs-Rails: Can reach both Home Layout: Multi-level;Laundry or work area in basement;Able to live on main level with bedroom/bathroom Alternate Therapist, sports of Steps: flight, does not need to go to basement as family can assist    Bathroom Shower/Tub: Teacher, early years/pre: Standard     Home Equipment: None   Additional Comments: family reports they also own a separate home that is level and has ramped enterance that she could stay at       Prior Functioning/Environment Level of Independence: Independent        Comments: caregiver for husband, who has memory problems,  and 2 other family members, very independent  at baseline         OT Problem List: Decreased strength;Decreased range of motion;Decreased activity tolerance;Impaired balance (sitting and/or standing);Decreased coordination;Decreased cognition;Decreased knowledge of use of DME or AE;Decreased knowledge of precautions;Impaired sensation;Impaired tone;Impaired UE functional use;Pain      OT Treatment/Interventions:  Self-care/ADL training;Therapeutic exercise;Neuromuscular education;DME and/or AE instruction;Manual therapy;Splinting;Therapeutic activities;Cognitive remediation/compensation;Patient/family education;Balance training;Visual/perceptual remediation/compensation    OT Goals(Current goals can be found in the care plan section) Acute Rehab OT Goals Patient Stated Goal: Pt very quiet, and did not state goals  OT Goal Formulation: With patient/family Time For Goal Achievement: 12/15/17 Potential to Achieve Goals: Good ADL Goals Pt Will Perform Eating: with min assist;sitting;bed level;with adaptive utensils;with assist to don/doff brace/orthosis Additional ADL Goal #1: Pt will tolerate EOB sitting x 15 mins with max A in prep for functional transfers Additional ADL Goal #2: Pt will maintain long sitting x 10 mins with max A in prep for ADLs Additional ADL Goal #3: Pt will tolerate splint wear and care Additional ADL Goal #4: family will be independent with PROM bil. UEs  OT Frequency: Min 2X/week   Barriers to D/C:            Co-evaluation PT/OT/SLP Co-Evaluation/Treatment: Yes Reason for Co-Treatment: Complexity of the patient's impairments (multi-system involvement);For patient/therapist safety PT goals addressed during session: Mobility/safety with mobility;Balance;Strengthening/ROM OT goals addressed during session: Strengthening/ROM      AM-PAC PT "6 Clicks" Daily Activity     Outcome Measure Help from another person eating meals?: Total Help from another person taking care of personal grooming?: Total Help from another person toileting, which includes using toliet, bedpan, or urinal?: Total Help from another person bathing (including washing, rinsing, drying)?: Total Help from another person to put on and taking off regular upper body clothing?: Total Help from another person to put on and taking off regular lower body clothing?: Total 6 Click Score: 6   End of Session Equipment  Utilized During Treatment: Oxygen Nurse Communication: Mobility status  Activity Tolerance: Patient limited by fatigue Patient left: in bed;with call bell/phone within reach;with family/visitor present  OT Visit Diagnosis: Muscle weakness (generalized) (M62.81)                Time: 3491-7915 OT Time Calculation (min): 43 min Charges:  OT General Charges $OT Visit: 1 Visit OT Evaluation $OT Eval Moderate Complexity: 1 Mod OT Treatments $Therapeutic Activity: 8-22 mins  Lucille Passy, OTR/L Acute Rehabilitation Services Pager [phone] Office [phone]   Lucille Passy M 12/01/2017, 1:20 PM

## 2017-12-01 NOTE — Evaluation (Signed)
Physical Therapy Evaluation Patient Details Name: Sherry [name] MRN: [id number] DOB: 1934/02/14 Today's Date: 12/01/2017   History of Present Illness  83yo female s/p fall after which she had immediate quadriplegia, per MD notes has regained some LE function. Negative for cervical fracture but does have acute T11 fracture. Head imaging negative for acute injury/changes. Received ACDF C5-C6, C6-C7 on 11/29/17. Per MD notes/assessment, symptoms consistent with possible central cord syndrome. PMH OA, HTN  Clinical Impression   Patient received in bed with family present, very pleasant and willing to participate in therapy session today; PT/OT co-treat due to complexity of impairments and for patient safety. Note positive clonus, as well as lack of proprioception and light touch sensation in B LEs; bilateral ankles very contracted into plantarflexion and MMT grossly 2- to 2/5 at best in B LEs. She requires TotalAx1 for rolling, and MaxAx2 for sidelying to sit/sit to sidelying. Able to maintain upright sitting for approximately 5 minutes with MinA and Min VC to maintain midline position however fatigues easily in upright. VSS during duration of sitting. Suspect possible BPPV versus post-concussive syndrome due to some nystagmus, difficulty fixating visually, and dizziness throughout session however unable to safely test due to restrains of ACDF precautions. She was left in bed positioned in R sidelying with all needs met, bed alarm active, and family present. She will continue to benefit from skilled PT services in the acute setting, as well as intensive skilled therapies in the CIR setting moving forward.     Follow Up Recommendations CIR    Equipment Recommendations  Other (comment)(defer to next venue )    Recommendations for Other Services Rehab consult     Precautions / Restrictions Precautions Precautions: Fall;Cervical;Back;Other (comment) Precaution Booklet Issued: No Precaution  Comments: back precautions due to thoracic fracture and ACDF; needs TLSO for all OOB activity but per order set does not need cervical collar  Required Braces or Orthoses: Spinal Brace Spinal Brace: Thoracolumbosacral orthotic;Applied in sitting position Restrictions Weight Bearing Restrictions: No      Mobility  Bed Mobility Overal bed mobility: Needs Assistance Bed Mobility: Rolling;Sidelying to Sit;Sit to Sidelying Rolling: Total assist   Supine to sit: +2 for physical assistance;Max assist;+2 for safety/equipment   Sit to sidelying: Max assist;+2 for physical assistance;+2 for safety/equipment General bed mobility comments: MaxA+2 for assist and to maintain safe precautions; able to sit EOB for approximately 5 minutes with MinA for balance and BP/SpO2 remaining WNL   Transfers                 General transfer comment: DNT, fatigue   Ambulation/Gait             General Gait Details: DNT, fatigue   Stairs            Wheelchair Mobility    Modified Rankin (Stroke Patients Only)       Balance Overall balance assessment: Needs assistance;History of Falls Sitting-balance support: No upper extremity supported;Feet supported Sitting balance-Leahy Scale: Poor       Standing balance-Leahy Scale: Zero                               Pertinent Vitals/Pain Pain Assessment: 0-10 Pain Score: 5  Pain Location: B UEs  Pain Descriptors / Indicators: Aching;Numbness;Tingling Pain Intervention(s): Limited activity within patient's tolerance;Monitored during session    Home Living Family/patient expects to be discharged to:: Private residence Living Arrangements: Spouse/significant other Available Help  at Discharge: Family;Available PRN/intermittently Type of Home: House Home Access: Stairs to enter Entrance Stairs-Rails: Can reach both Entrance Stairs-Number of Steps: 3 Home Layout: Multi-level;Laundry or work area in basement;Able to live on  main level with bedroom/bathroom Home Equipment: None Additional Comments: family reports they also own a separate home that is level and has ramped enterance that she could stay at     Prior Function Level of Independence: Independent         Comments: 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, very independent at baseline      Hand Dominance        Extremity/Trunk Assessment   Upper Extremity Assessment Upper Extremity Assessment: Defer to OT evaluation    Lower Extremity Assessment Lower Extremity Assessment: Generalized weakness;RLE deficits/detail;LLE deficits/detail RLE Deficits / Details: MMT grossly 2- to 2/5 B LEs  RLE Sensation: decreased light touch;decreased proprioception RLE Coordination: decreased fine motor;decreased gross motor LLE Deficits / Details: MMT grossly 2- to 2/5 B LEs  LLE Sensation: decreased light touch;decreased proprioception LLE Coordination: decreased fine motor;decreased gross motor    Cervical / Trunk Assessment Cervical / Trunk Assessment: Kyphotic  Communication   Communication: No difficulties  Cognition Arousal/Alertness: Awake/alert Behavior During Therapy: WFL for tasks assessed/performed Overall Cognitive Status: Within Functional Limits for tasks assessed                                        General Comments General comments (skin integrity, edema, etc.): intermittent nystagmus with visual tracking, also ongoing dizziness despite VSS. Suspect possible BPPV VS post-concussive symptoms, unable to examine in detail due to ACDF precautions     Exercises     Assessment/Plan    PT Assessment Patient needs continued PT services  PT Problem List Decreased strength;Decreased mobility;Decreased safety awareness;Decreased coordination;Decreased range of motion;Decreased knowledge of precautions;Decreased activity tolerance;Decreased balance;Pain;Decreased knowledge of use of DME;Impaired sensation        PT Treatment Interventions DME instruction;Therapeutic activities;Gait training;Therapeutic exercise;Patient/family education;Stair training;Balance training;Functional mobility training;Neuromuscular re-education    PT Goals (Current goals can be found in the Care Plan section)  Acute Rehab PT Goals Patient Stated Goal: get better PT Goal Formulation: With patient/family Time For Goal Achievement: 12/15/17 Potential to Achieve Goals: Good    Frequency Min 3X/week   Barriers to discharge        Co-evaluation PT/OT/SLP Co-Evaluation/Treatment: Yes Reason for Co-Treatment: Complexity of the patient's impairments (multi-system involvement);For patient/therapist safety;To address functional/ADL transfers PT goals addressed during session: Mobility/safety with mobility;Balance;Strengthening/ROM         AM-PAC PT "6 Clicks" Daily Activity  Outcome Measure Difficulty turning over in bed (including adjusting bedclothes, sheets and blankets)?: Unable Difficulty moving from lying on back to sitting on the side of the bed? : Unable Difficulty sitting down on and standing up from a chair with arms (e.g., wheelchair, bedside commode, etc,.)?: Unable Help needed moving to and from a bed to chair (including a wheelchair)?: Total Help needed walking in hospital room?: Total Help needed climbing 3-5 steps with a railing? : Total 6 Click Score: 6    End of Session Equipment Utilized During Treatment: Oxygen Activity Tolerance: Patient limited by fatigue;Patient tolerated treatment well Patient left: in bed;with bed alarm set;with call bell/phone within reach;with family/visitor present Nurse Communication: Other (comment)(MD- PT/OT recommendations and request for splint orders ) PT Visit Diagnosis: Unsteadiness on feet (R26.81);Muscle weakness (generalized) (  M62.81);Other abnormalities of gait and mobility (R26.89);History of falling (Z91.81);Other (comment)(quadraparesis )    Time:  3735-7897 PT Time Calculation (min) (ACUTE ONLY): 60 min   Charges:   PT Evaluation $PT Eval High Complexity: 1 High PT Treatments $Therapeutic Activity: 8-22 mins        Deniece [name] PT, DPT, CBIS  Supplemental Physical Therapist [place]    Pager [phone] Acute Rehab Office [phone]

## 2017-12-01 NOTE — Progress Notes (Signed)
Verbal order received from MD for bilateral PRAFO boots; placed order with ortho tech for gray rigid PRAFOs.   Deniece [name] PT, DPT, CBIS  Supplemental Physical Therapist [facility]    Pager [phone] Acute Rehab Office [phone]

## 2017-12-01 NOTE — Consult Note (Signed)
Physical Medicine and Rehabilitation Consult Reason for Consult: Quadriplegia Referring Physician: Dr. Venetia Constable   HPI: Sherry [name] is a 83 y.o. right-handed female with history of hypertension.  Per chart review patient lives with spouse.  Independent prior to admission.  Multilevel home with bed and bath on main level.  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  Presented 11/29/2017 after a fall with immediate quadriparesis.  Cranial CT scan negative.  CT/ MRI imaging showed C6-7 disc fracture and tear of anterior longitudinal ligament.  Paraspinal muscle and interspinous edema from C1-C7 compatible ligament injury and muscle strain.  Increased cord signal at C5 and C6.  No cord hemorrhage as well as T11 coronal vertebral body fracture.  Underwent C5-6 and C6-7 anterior cervical discectomy and fusion 11/29/2017 per Dr. Venetia Constable.  TLSO back brace when out of bed.  Hospital course pain management.  Therapy evaluations completed with recommendations of physical medicine rehab consult.    Review of Systems  Constitutional: Negative for chills and fever.  HENT: Negative for hearing loss.   Eyes: Negative for blurred vision and double vision.  Respiratory: Negative for cough and shortness of breath.   Cardiovascular: Negative for leg swelling.  Gastrointestinal: Positive for constipation. Negative for nausea and vomiting.  Genitourinary: Negative for dysuria, flank pain and hematuria.  Musculoskeletal: Positive for myalgias.  Skin: Negative for rash.  Neurological: Positive for tingling and focal weakness.  All other systems reviewed and are negative.  Past Medical History:  Diagnosis Date  . Arthritis   . Hypertension    Past Surgical History:  Procedure Laterality Date  . ANTERIOR CERVICAL DECOMP/DISCECTOMY FUSION N/A 11/29/2017   Procedure: Cervical five-six, six-seven Anterior Cervical Decompression Fusion;  Surgeon: Judith Part,  MD;  Location: [facility];  Service: Neurosurgery;  Laterality: N/A;  Cervical five-six, six-seven Anterior Cervical Decompression Fusion   History reviewed. No pertinent family history. Social History:  reports that she has never smoked. She has never used smokeless tobacco. She reports that she does not drink alcohol or use drugs. Allergies:  Allergies  Allergen Reactions  . Beef-Derived Products Swelling    Facial and lip swelling (due to tick bite)  . Lisinopril-Hydrochlorothiazide Swelling    Tongue swelling   Medications Prior to Admission  Medication Sig Dispense Refill  . amLODipine (NORVASC) 10 MG tablet Take 10 mg by mouth daily after supper.    . Ascorbic Acid (VITAMIN C PO) Take 1 tablet by mouth daily.    Marland Kitchen b complex vitamins tablet Take 1 tablet by mouth daily.    . Cholecalciferol (VITAMIN D PO) Take 1 tablet by mouth daily.     . metronidazole (NORITATE) 1 % cream Apply 1 application topically daily as needed (rosacea).    . Multiple Vitamin (MULTIVITAMIN WITH MINERALS) TABS tablet Take 1 tablet by mouth daily.    . naproxen sodium (ALEVE) 220 MG tablet Take 220 mg by mouth 2 (two) times daily as needed (pain/headache).    . triamcinolone cream (KENALOG) 0.1 % Apply 1 application topically daily as needed (eczema).      Home: Home Living Family/patient expects to be discharged to:: (P) Private residence Living Arrangements: (P) Spouse/significant other Available Help at Discharge: (P) Family, Available PRN/intermittently Type of Home: (P) House Home Access: (P) Stairs to enter Entrance Stairs-Number of Steps: (P) 3 Entrance Stairs-Rails: (P) Can reach both Home Layout: (P) Multi-level, Laundry or work area in basement, Able to live  on main level with bedroom/bathroom Alternate Level Stairs-Number of Steps: (P) flight, does not need to go to basement as family can assist  Bathroom Shower/Tub: (P) Tub/shower unit Bathroom Toilet: (P) Standard Home Equipment: (P)  None Additional Comments: (P) family reports they also own a separate home that is level and has ramped enterance that she could stay at   Functional History: Prior Function Level of Independence: (P) Independent Comments: (P) caregiver for husband, who has memory problems,  and 2 other family members, very independent at baseline  Functional Status:  Mobility: Bed Mobility Overal bed mobility: (P) Needs Assistance Bed Mobility: (P) Rolling, Sidelying to Sit, Sit to Sidelying Rolling: (P) Total assist Sidelying to sit: (P) Total assist, +2 for physical assistance, +2 for safety/equipment Supine to sit: +2 for physical assistance, Max assist, +2 for safety/equipment Sit to sidelying: (P) Total assist, +2 for physical assistance, +2 for safety/equipment General bed mobility comments: (P) Pt instructed in log rolling and requires assist for all aspects  Transfers General transfer comment: (P) unable to attempt due to safety  Ambulation/Gait General Gait Details: DNT, fatigue     ADL: ADL Overall ADL's : (P) Needs assistance/impaired Eating/Feeding: (P) Total assistance, Bed level Grooming: (P) Wash/dry hands, Wash/dry face, Oral care, Brushing hair, Total assistance, Bed level Upper Body Bathing: (P) Total assistance, Bed level Lower Body Bathing: (P) Total assistance, Bed level Upper Body Dressing : (P) Total assistance, Bed level Lower Body Dressing: (P) Total assistance, Bed level Toilet Transfer: (P) Total assistance Toilet Transfer Details (indicate cue type and reason): (P) unable  Toileting- Clothing Manipulation and Hygiene: (P) Total assistance, Bed level Functional mobility during ADLs: (P) Total assistance, +2 for physical assistance General ADL Comments: (P) requires total A for all aspects   Cognition: Cognition Overall Cognitive Status: (P) Within Functional Limits for tasks assessed Orientation Level: Oriented X4 Cognition Arousal/Alertness: (P)  Awake/alert Behavior During Therapy: (P) WFL for tasks assessed/performed Overall Cognitive Status: (P) Within Functional Limits for tasks assessed General Comments: (P) Cognition only assessed for basic info.  She has no recall of event, and unsure of LOC.  Cognition will need to be further assessed   Blood pressure (!) 97/54, pulse 74, temperature 98.6 F (37 C), temperature source Oral, resp. rate 18, height 5\' 7"  (1.702 m), weight 78.9 kg, SpO2 96 %. Physical Exam  Vitals reviewed. Constitutional: She is oriented to person, place, and time.  HENT:  Patient with bruising around the right orbital area and cheek  Eyes:  Pupils reactive to light  Neck: Normal range of motion. Neck supple. No thyromegaly present.  Cardiovascular: Normal rate, regular rhythm and normal heart sounds.  Respiratory: Effort normal. No respiratory distress.  GI: Soft. Bowel sounds are normal. She exhibits no distension.  Neurological: She is alert and oriented to person, place, and time.  Motor strength is 4/5 in the deltoids and biceps 0 at the triceps 0 at the finger extensors and finger flexors as well as hand intrinsics, 3- at bilateral wrist extensors. Lower extremity strength is trace hip knee extensor synergy in the right lower extremity 0 at the right ankle 2- at the left hip knee extensor synergy as well as 2- ankle plantarflexion and trace ankle dorsiflexion. Sensation is absent below T8 partial in the upper trunk absent bilateral C8 dermatomes as well as right C7, intact left C7 and bilateral C6  No results found for this or any previous visit (from the past 24 hour(s)). Dg Cervical Spine 2-3  Views  Result Date: 11/29/2017 CLINICAL DATA:  Elective surgical spine surgery. EXAM: CERVICAL SPINE - 2-3 VIEW; DG C-ARM 61-120 MIN COMPARISON:  MRI 11/29/2017 and CT 11/28/2017 FINDINGS: Vertebral body alignment and heights are within normal. There is mild spondylosis present. Anterior fusion hardware is seen  at the C5-6 level. Recommend correlation with findings at the time of the procedure. IMPRESSION: Mild spondylosis with anterior fusion hardware at the C5-6 level. Electronically Signed   By: Marin Olp M.D.   On: 11/29/2017 20:24   Dg C-arm 1-60 Min  Result Date: 11/29/2017 CLINICAL DATA:  Elective surgical spine surgery. EXAM: CERVICAL SPINE - 2-3 VIEW; DG C-ARM 61-120 MIN COMPARISON:  MRI 11/29/2017 and CT 11/28/2017 FINDINGS: Vertebral body alignment and heights are within normal. There is mild spondylosis present. Anterior fusion hardware is seen at the C5-6 level. Recommend correlation with findings at the time of the procedure. IMPRESSION: Mild spondylosis with anterior fusion hardware at the C5-6 level. Electronically Signed   By: Marin Olp M.D.   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 Does the need for close, 24 hr/day medical supervision in concert with the patient's rehab needs make it unreasonable for this patient to be served in a less intensive setting? Yes 2. Co-Morbidities requiring supervision/potential complications: Hypertension, at risk for pneumonia and urinary tract infection given her neurogenic bladder. 3. Due to bladder management, bowel management, safety, skin/wound care, disease management, medication administration, pain management and patient education, does the patient require 24 hr/day rehab nursing? Yes 4. Does the patient require coordinated care of a physician, rehab nurse, PT (1-2 hrs/day, 5 days/week) and OT (1-2 hrs/day, 5 days/week) to address physical and functional deficits in the context of the above medical diagnosis(es)? Yes Addressing deficits in the following areas: balance, endurance, locomotion, strength, transferring, bowel/bladder control, bathing,  dressing, feeding, grooming, toileting and psychosocial support 5. Can the patient actively participate in an intensive therapy program of at least 3 hrs of therapy per day at least 5 days per week? Yes although may initially need 15/7 schedule 6. The potential for patient to make measurable gains while on inpatient rehab is good 7. Anticipated functional outcomes upon discharge from inpatient rehab are min assist and Wheelchair level  with PT, min assist with OT, n/a with SLP. 8. Estimated rehab length of stay to reach the above functional goals is: 21 to 27 days 9. Anticipated D/C setting: Home 10. Anticipated post D/C treatments: [place] therapy 11. Overall Rehab/Functional Prognosis: good  RECOMMENDATIONS: This patient's condition is appropriate for continued rehabilitative care in the following setting: CIR Patient has agreed to participate in recommended program. Yes Note that insurance prior authorization may be required for reimbursement for recommended care.  Comment:  "I have personally performed a face to face diagnostic evaluation of this patient.  Additionally, I have reviewed and concur with the physician assistant's documentation above." Charlett Blake M.D. [facility] FAAPM&R (Sports Med, Neuromuscular Med) Diplomate Am Board of Electrodiagnostic Med  Cathlyn Parsons, PA-C 12/01/2017

## 2017-12-01 NOTE — Progress Notes (Signed)
Orthopedic Tech Progress Note Patient Details:  Sherry [name] 20-Mar-1934 [id number]  Patient ID: [name], female   DOB: 1934/04/07, 83 y.o.   MRN: [id number]   [name] 12/01/2017, 1:53 PM Called in bio-tech brace order; spoke with Bella Kennedy

## 2017-12-01 NOTE — Progress Notes (Signed)
Occupational Therapy Treatment Patient Details Name: Sherry [name] MRN: [id number] DOB: 01-07-1935 Today's Date: 12/01/2017    History of present illness  83yo female s/p fall after which she had immediate quadriplegia, per MD notes has regained some LE function. Pt with no recall of events.  CT of spine was Negative for cervical fracture but does have acute T11 fracture.  MRI of cervical spine showed C6-7 disc fracture and tear of anterior longitudinal ligament, paraspinal muscle and interspinous edema from C1-7 compatible with ligatment injury and muscle strain, likely cord cord contusion C5-6.  Head imaging negative for acute injury/changes. Received ACDF C5-C6, C6-C7 on 11/29/17. Per MD notes/assessment, symptoms consistent with possible central cord syndrome. PMH OA, HTN   OT comments  Pt with 2/5 triceps this pm, but fatigues rapidly (only able to perform x 3 reps with max effort).  Pt fitted with resting hand splints.  They were checked after 1 hour with no evidence of pressure.  Splints removed and will monitor them throughout day tomorrow to ensure proper fit, then establish nighttime wear schedule.  PROM to hands performed - daughter instructed how to perform.    Follow Up Recommendations  CIR;Supervision/Assistance - 24 hour    Equipment Recommendations  None recommended by OT    Recommendations for Other Services Rehab consult    Precautions / Restrictions Precautions Precautions: Fall;Cervical;Back;Other (comment) Precaution Booklet Issued: No Precaution Comments: back precautions due to thoracic fracture and ACDF; needs TLSO for all OOB activity but per order set does not need cervical collar  Required Braces or Orthoses: Spinal Brace Spinal Brace: Thoracolumbosacral orthotic;Applied in sitting position       Mobility Bed Mobility                  Transfers                      Balance                                            ADL either performed or assessed with clinical judgement   ADL                                               Vision       Perception     Praxis      Cognition Arousal/Alertness: Lethargic Behavior During Therapy: WFL for tasks assessed/performed Overall Cognitive Status: Within Functional Limits for tasks assessed                                 General Comments: for basic info         Exercises Exercises: Other exercises Other Exercises Other Exercises: Pt performed elbow extension x 3 each side before fatiging - requires increased effort to perform.  Pt fitted with bil. Resting hand splints.  Splints checked and removed after 1 hour.  No evidence of pressure.  PROM to bil. hands performed.  Other daughter present and instruced to perform PROM to hands.      Shoulder Instructions       General Comments      Pertinent Vitals/ Pain  Pain Assessment: No/denies pain  Home Living                                          Prior Functioning/Environment              Frequency  Min 2X/week        Progress Toward Goals  OT Goals(current goals can now be found in the care plan section)  Progress towards OT goals: Progressing toward goals  ADL Goals Pt Will Perform Eating: with min assist;sitting;bed level;with adaptive utensils;with assist to don/doff brace/orthosis Additional ADL Goal #1: Pt will tolerate EOB sitting x 15 mins with max A in prep for functional transfers Additional ADL Goal #2: Pt will maintain long sitting x 10 mins with max A in prep for ADLs Additional ADL Goal #3: Pt will tolerate splint wear and care Additional ADL Goal #4: family will be independent with PROM bil. UEs  Plan Discharge plan remains appropriate    Co-evaluation                 AM-PAC PT "6 Clicks" Daily Activity     Outcome Measure   Help from another person eating meals?: Total Help from another person  taking care of personal grooming?: Total Help from another person toileting, which includes using toliet, bedpan, or urinal?: Total Help from another person bathing (including washing, rinsing, drying)?: Total Help from another person to put on and taking off regular upper body clothing?: Total Help from another person to put on and taking off regular lower body clothing?: Total 6 Click Score: 6    End of Session Equipment Utilized During Treatment: Oxygen      Activity Tolerance Patient limited by fatigue   Patient Left in bed;with call bell/phone within reach;with family/visitor present   Nurse Communication Mobility status        Time: 6301-6010 OT Time Calculation (min): 14 min  Charges: OT General Charges $OT Visit: 1 Visit OT Treatments $Therapeutic Exercise: 8-22 mins  Lucille Passy, OTR/L [place] Pager [phone] Office [phone]    Lucille Passy M 12/01/2017, 4:52 PM

## 2017-12-02 MED ORDER — DM-GUAIFENESIN ER 30-600 MG PO TB12
1.0000 | ORAL_TABLET | Freq: Two times a day (BID) | ORAL | Status: DC
  Administered 2017-12-02 – 2017-12-07 (×10): 1 via ORAL
  Filled 2017-12-02 (×13): qty 1

## 2017-12-02 NOTE — Progress Notes (Signed)
Incision on Left Arm began to bleed while transferring back to bed. RN came into contact with blood on left hand. RN washed hands and recovered patients incision with Curlex.

## 2017-12-02 NOTE — Care Management Important Message (Signed)
Important Message  Patient Details  Name: Sherry [name] MRN: [id number] Date of Birth: 12/09/1934   Medicare Important Message Given:  Yes    [name] 12/02/2017, 4:55 PM

## 2017-12-02 NOTE — Progress Notes (Signed)
Neurosurgery Service Progress Note  Subjective: No acute events overnight, no new complaints, still some soreness from positioning Objective: Vitals:   12/01/17 1609 12/01/17 2001 12/02/17 0307 12/02/17 0753  BP: (!) 108/58 115/64 109/60 111/65  Pulse: 81 84 77 76  Resp: 18 20 20 20   Temp: 99.4 F (37.4 C) 99.4 F (37.4 C) 98.6 F (37 C) 98.5 F (36.9 C)  TempSrc: Oral Oral Oral Oral  SpO2: 96% 98% 96% 98%  Weight:      Height:       Temp (24hrs), Avg:98.9 F (37.2 C), Min:98.5 F (36.9 C), Max:99.4 F (37.4 C)  CBC Latest Ref Rng & Units 11/28/2017 11/28/2017  WBC 4.0 - 10.5 K/uL 8.9 -  Hemoglobin 12.0 - 15.0 g/dL 12.0 12.6  Hematocrit 36.0 - 46.0 % 38.5 37.0  Platelets 150 - 400 K/uL 232 -   BMP Latest Ref Rng & Units 11/28/2017 11/28/2017  Glucose 70 - 99 mg/dL 138(H) 134(H)  BUN 8 - 23 mg/dL 21 22  Creatinine 0.44 - 1.00 mg/dL 0.88 0.90  Sodium 135 - 145 mmol/L 138 140  Potassium 3.5 - 5.1 mmol/L 3.8 3.8  Chloride 98 - 111 mmol/L 108 104  CO2 22 - 32 mmol/L 24 -  Calcium 8.9 - 10.3 mg/dL 8.7(L) -    Intake/Output Summary (Last 24 hours) at 12/02/2017 0931 Last data filed at 12/02/2017 0311 Gross per 24 hour  Intake 420 ml  Output 3350 ml  Net -2930 ml    Current Facility-Administered Medications:  .  0.9 %  sodium chloride infusion, 250 mL, Intravenous, Continuous, Ketih Goodie, Joyice Faster, MD, Stopped at 11/29/17 [phone] .  0.9 %  sodium chloride infusion, , Intravenous, Continuous, Danzell Birky, Joyice Faster, MD, Last Rate: 75 mL/hr at 12/01/17 1514 .  acetaminophen (TYLENOL) tablet 650 mg, 650 mg, Oral, Q4H PRN **OR** acetaminophen (TYLENOL) suppository 650 mg, 650 mg, Rectal, Q4H PRN, Faatimah Spielberg A, MD .  amLODipine (NORVASC) tablet 10 mg, 10 mg, Oral, QPC supper, Patrisia Faeth A, MD .  cyclobenzaprine (FLEXERIL) tablet 10 mg, 10 mg, Oral, TID PRN, Judith Part, MD, 10 mg at 12/02/17 0924 .  gabapentin (NEURONTIN) capsule 100 mg, 100 mg, Oral, TID,  Judith Part, MD, 100 mg at 12/02/17 0924 .  guaiFENesin-dextromethorphan (ROBITUSSIN DM) 100-10 MG/5ML syrup 5 mL, 5 mL, Oral, Q4H PRN, Judith Part, MD, 5 mL at 12/02/17 0238 .  heparin injection 5,000 Units, 5,000 Units, Subcutaneous, Q8H, Judith Part, MD, 5,000 Units at 12/02/17 0531 .  HYDROmorphone (DILAUDID) injection 0.5 mg, 0.5 mg, Intravenous, Q3H PRN, Judith Part, MD, 0.5 mg at 12/01/17 2006 .  lactated ringers infusion, , Intravenous, Continuous, Crist Kruszka, Joyice Faster, MD, Last Rate: 0 mL/hr at 11/29/17 1735 .  loratadine (CLARITIN) tablet 10 mg, 10 mg, Oral, Daily, Judith Part, MD, 10 mg at 12/02/17 0924 .  menthol-cetylpyridinium (CEPACOL) lozenge 3 mg, 1 lozenge, Oral, PRN, 3 mg at 11/30/17 0613 **OR** phenol (CHLORASEPTIC) mouth spray 1 spray, 1 spray, Mouth/Throat, PRN, Jolan Mealor A, MD .  metroNIDAZOLE (METROGEL) 5.32 % gel 1 application, 1 application, Topical, Daily PRN, Judith Part, MD .  multivitamin liquid 15 mL, 15 mL, Oral, Daily, Judith Part, MD, 15 mL at 12/02/17 0924 .  ondansetron (ZOFRAN) tablet 4 mg, 4 mg, Oral, Q6H PRN **OR** ondansetron (ZOFRAN) injection 4 mg, 4 mg, Intravenous, Q6H PRN, Judith Part, MD, 4 mg at 11/29/17 1331 .  oxyCODONE (Oxy IR/ROXICODONE) immediate release tablet  10 mg, 10 mg, Oral, Q3H PRN, Judith Part, MD, 10 mg at 12/02/17 0033 .  oxyCODONE (Oxy IR/ROXICODONE) immediate release tablet 5 mg, 5 mg, Oral, Q3H PRN, Judith Part, MD, 5 mg at 12/02/17 0923 .  polyethylene glycol (MIRALAX / GLYCOLAX) packet 17 g, 17 g, Oral, Daily PRN, Elverna Caffee A, MD .  sodium chloride (OCEAN) 0.65 % nasal spray 1 spray, 1 spray, Each Nare, PRN, Judith Part, MD, 1 spray at 12/01/17 1241 .  sodium chloride (OCEAN) 0.65 % nasal spray 1 spray, 1 spray, Each Nare, PRN, Thelma Viana A, MD .  sodium chloride flush (NS) 0.9 % injection 3 mL, 3 mL, Intravenous, Q12H,  Yanissa Michalsky, Joyice Faster, MD, 3 mL at 12/01/17 2222 .  sodium chloride flush (NS) 0.9 % injection 3 mL, 3 mL, Intravenous, PRN, Sybella Harnish A, MD .  triamcinolone cream (KENALOG) 0.1 % 1 application, 1 application, Topical, Daily PRN, Judith Part, MD   Physical Exam: AOx3, PERRL, EOMI, FS, TM,  Strength 4-/5 proximally in BUE, 3/5 distally, 4-/5 in BLE C4 sensory level, intact temperature, no propioception Incision c/d/i  Assessment & Plan: 83 y.o. woman s/p fall with cervical extension injury and central cord syndrome, MRI w/ ALL and disc rupture and severe stenosis from C5-C7 with cord edema, 10/29 s/p C5-6/6-7 ACDF, recovering well. Other injuries include T11 coronal VB fracture. -TLSO for T11 fracture, uprights show stable alignment in brace -OOB as tolerated with TLSO brace on -PT/OT rec CIR, PM&R consulted, CIR transfer pending -no C-collar -SCDs/TEDs, SQH   Judith Part  12/02/17 9:31 AM

## 2017-12-02 NOTE — Progress Notes (Signed)
RN received instructions from Yvone Neu in physical therapy describing how to move patient from bed to chair and back. PT stated to use Maxi lift to transport patient from bed into chair. PT recommended putting on TLSO brace once patient was in chair and able to sit. PT recommended two people helping to transfer patient and also stated that patient did not need cervical collar while up in chair. (Patient did have cervical collar on along with TLSO brace when patient requested to return back to bed from chair after working with PT) PT recommended keeping TLSO brace on patient while using Maxi lift to get patient back into bed, then removing TLSO while patient laying and able to roll in bed. RN implemented instructions for returning patient back into bed with success. RN used NT Zoe for assistance. Patient tolerated move well except for having to roll to remove brace and lift pad. PT recommends getting patient up from bed to chair at least once per day and requested that this be completed this weekend. Instructions provided to NT Zoe and written in chart for record.

## 2017-12-02 NOTE — Progress Notes (Signed)
Physical Therapy Treatment Patient Details Name: Sherry [name] MRN: [id number] DOB: [birth year] Today's Date: 12/02/2017    History of Present Illness  83yo female s/p fall after which she had immediate quadriplegia, per MD notes has regained some LE function. Pt with no recall of events.  CT of spine was Negative for cervical fracture but does have acute T11 fracture.  MRI of cervical spine showed C6-7 disc fracture and tear of anterior longitudinal ligament, paraspinal muscle and interspinous edema from C1-7 compatible with ligatment injury and muscle strain, likely cord cord contusion C5-6.  Head imaging negative for acute injury/changes. Received ACDF C5-C6, C6-C7 on 11/29/17. Per MD notes/assessment, symptoms consistent with possible central cord syndrome. PMH OA, HTN    PT Comments    Pt showing a little return in extremities, esp Ue's,   Emphasis on transition to EOB, sitting balance/donning the brace, scoot transfer to the chair.    Follow Up Recommendations  CIR     Equipment Recommendations  Other (comment)(TBA)    Recommendations for Other Services Rehab consult     Precautions / Restrictions Precautions Precautions: Fall;Cervical;Back;Other (comment) Precaution Booklet Issued: No Precaution Comments: back precautions due to thoracic fracture and ACDF; needs TLSO for all OOB activity but per order set does not need cervical collar  Required Braces or Orthoses: Spinal Brace Spinal Brace: Thoracolumbosacral orthotic;Applied in sitting position Restrictions Weight Bearing Restrictions: No    Mobility  Bed Mobility Overal bed mobility: Needs Assistance Bed Mobility: Rolling;Sidelying to Sit Rolling: Total assist Sidelying to sit: Total assist;+2 for physical assistance;+2 for safety/equipment       General bed mobility comments: Reinforced technique. Use hook elbow technique to assist roll.  Significant assist for truncal stability and up.  Transfers Overall  transfer level: Needs assistance   Transfers: Lateral/Scoot Transfers          Lateral/Scoot Transfers: Total assist;+2 physical assistance General transfer comment: unable to complete squat pivot due to pad not positioned well.  Utilized a lateral scoot with drop arm recliner well.  Ambulation/Gait                 Stairs             Wheelchair Mobility    Modified Rankin (Stroke Patients Only)       Balance Overall balance assessment: Needs assistance;History of Falls Sitting-balance support: No upper extremity supported;Feet supported Sitting balance-Leahy Scale: Poor Sitting balance - Comments: pt sat 6-8 min working balance with max asssit.  during this time we donn the TLSO.     Standing balance-Leahy Scale: Zero                              Cognition Arousal/Alertness: Awake/alert Behavior During Therapy: Flat affect Overall Cognitive Status: Within Functional Limits for tasks assessed                                 General Comments: for basic info       Exercises Other Exercises Other Exercises: AA/PROM to bil UE and LE's    General Comments        Pertinent Vitals/Pain Pain Assessment: Faces Faces Pain Scale: Hurts even more Pain Location: back and L UE Pain Descriptors / Indicators: Aching;Grimacing;Sore Pain Intervention(s): Monitored during session    Home Living  Prior Function            PT Goals (current goals can now be found in the care plan section) Acute Rehab PT Goals Patient Stated Goal: Pt very quiet, and did not state goals  PT Goal Formulation: With patient/family Time For Goal Achievement: 12/15/17 Potential to Achieve Goals: Good Progress towards PT goals: Progressing toward goals    Frequency    Min 3X/week      PT Plan Current plan remains appropriate    Co-evaluation              AM-PAC PT "6 Clicks" Daily Activity  Outcome  Measure  Difficulty turning over in bed (including adjusting bedclothes, sheets and blankets)?: Unable Difficulty moving from lying on back to sitting on the side of the bed? : Unable Difficulty sitting down on and standing up from a chair with arms (e.g., wheelchair, bedside commode, etc,.)?: Unable Help needed moving to and from a bed to chair (including a wheelchair)?: Total Help needed walking in hospital room?: Total Help needed climbing 3-5 steps with a railing? : Total 6 Click Score: 6    End of Session   Activity Tolerance: Patient limited by fatigue;Patient limited by pain Patient left: in chair;with call bell/phone within reach;with chair alarm set;Other (comment)(on maxilift sling) Nurse Communication: Other (comment) PT Visit Diagnosis: Other abnormalities of gait and mobility (R26.89);Muscle weakness (generalized) (M62.81);Other symptoms and signs involving the nervous system (R29.898);Pain Pain - part of body: (back and L UE)     Time: 9476-5465 PT Time Calculation (min) (ACUTE ONLY): 38 min  Charges:  $Therapeutic Activity: 23-37 mins $Neuromuscular Re-education: 8-22 mins                     12/02/2017  Donnella Sham, PT [place] [phone]  (pager) [phone]  (office)   Tessie Fass Meenakshi Sazama 12/02/2017, 4:40 PM

## 2017-12-02 NOTE — Progress Notes (Addendum)
Occupational Therapy Treatment Patient Details Name: Sherry [name] MRN: [id number] DOB: 06/16/1934 Today's Date: 12/02/2017    History of present illness  83yo female s/p fall after which she had immediate quadriplegia, per MD notes has regained some LE function. Pt with no recall of events.  CT of spine was Negative for cervical fracture but does have acute T11 fracture.  MRI of cervical spine showed C6-7 disc fracture and tear of anterior longitudinal ligament, paraspinal muscle and interspinous edema from C1-7 compatible with ligatment injury and muscle strain, likely cord cord contusion C5-6.  Head imaging negative for acute injury/changes. Received ACDF C5-C6, C6-C7 on 11/29/17. Per MD notes/assessment, symptoms consistent with possible central cord syndrome. PMH OA, HTN   OT comments  Pt demonstrates 2/5 elbow extension bil, but requires increased effort at times to activate muscle contraction.  1/5 bil. Hands flexion and extension of fingers (Rt hand palpable contraction, Lt hand very minimal excursion of movement of digits).  Resting hand splints applied to bil. Hands.  Will monitor for signs/symptoms of pressure, then establish wear schedule. Recommend CIR as pt will need the intensity, consistency provided by CIR  And more importantly staff who specialize in treatment of SCI.  She is at risk of skin breakdown, falls, immobility (and secondary effects of immobility).     SNF level therapies will not adequately prepare pt for living with SCI and risk for readmission is great.    Follow Up Recommendations  CIR;Supervision/Assistance - 24 hour    Equipment Recommendations  None recommended by OT    Recommendations for Other Services Rehab consult    Precautions / Restrictions Precautions Precautions: Fall;Cervical;Back;Other (comment) Precaution Booklet Issued: No Precaution Comments: back precautions due to thoracic fracture and ACDF; needs TLSO for all OOB activity but per order set  does not need cervical collar  Required Braces or Orthoses: Spinal Brace Spinal Brace: Thoracolumbosacral orthotic;Applied in sitting position       Mobility Bed Mobility                  Transfers                      Balance                                           ADL either performed or assessed with clinical judgement   ADL                                               Vision       Perception     Praxis      Cognition Arousal/Alertness: Awake/alert Behavior During Therapy: Flat affect Overall Cognitive Status: Within Functional Limits for tasks assessed                                 General Comments: for basic info         Exercises Exercises: General Upper Extremity General Exercises - Upper Extremity Elbow Extension: AROM;AAROM;Right;Left;10 reps;Supine Digit Composite Flexion: AAROM;Right;Left;10 reps;Supine Composite Extension: AAROM;Right;Left;10 reps;Supine Other Exercises Other Exercises: Splints applied to bil hands.  Instructed pt, daughter and nurse in purpose of splints.  Shoulder Instructions       General Comments 02 sats RA 90-94%    Pertinent Vitals/ Pain       Pain Assessment: No/denies pain  Home Living                                          Prior Functioning/Environment              Frequency  Min 2X/week        Progress Toward Goals  OT Goals(current goals can now be found in the care plan section)  Progress towards OT goals: Progressing toward goals     Plan Discharge plan remains appropriate    Co-evaluation                 AM-PAC PT "6 Clicks" Daily Activity     Outcome Measure   Help from another person eating meals?: Total Help from another person taking care of personal grooming?: Total Help from another person toileting, which includes using toliet, bedpan, or urinal?: Total Help from another person  bathing (including washing, rinsing, drying)?: Total Help from another person to put on and taking off regular upper body clothing?: Total Help from another person to put on and taking off regular lower body clothing?: Total 6 Click Score: 6    End of Session    OT Visit Diagnosis: Muscle weakness (generalized) (M62.81)   Activity Tolerance Patient tolerated treatment well   Patient Left in bed;with call bell/phone within reach;with family/visitor present;with nursing/sitter in room   Nurse Communication Mobility status        Time: 7829-5621 OT Time Calculation (min): 35 min  Charges: OT General Charges $OT Visit: 1 Visit OT Treatments $Neuromuscular Re-education: 8-22 mins $Orthotics Fit/Training: 8-22 mins  Lucille Passy, OTR/L Acute Rehabilitation Services Pager [phone] Office [phone]    Lucille Passy M 12/02/2017, 12:14 PM

## 2017-12-02 NOTE — Progress Notes (Signed)
Occupational Therapy Progress Note  Pt is tolerating splint wear (bil. Resting hand splints).  No signs of pressure noted.  Recommend pt wear splints at night and off during day.  Instructed RN, daughter and pt of schedule.  Will follow.    12/02/17 1400  OT Visit Information  Last OT Received On 12/02/17  Assistance Needed +2  History of Present Illness  83yo female s/p fall after which she had immediate quadriplegia, per MD notes has regained some LE function. Pt with no recall of events.  CT of spine was Negative for cervical fracture but does have acute T11 fracture.  MRI of cervical spine showed C6-7 disc fracture and tear of anterior longitudinal ligament, paraspinal muscle and interspinous edema from C1-7 compatible with ligatment injury and muscle strain, likely cord cord contusion C5-6.  Head imaging negative for acute injury/changes. Received ACDF C5-C6, C6-C7 on 11/29/17. Per MD notes/assessment, symptoms consistent with possible central cord syndrome. PMH OA, HTN  Precautions  Precautions Fall;Cervical;Back;Other (comment)  Precaution Booklet Issued No  Precaution Comments back precautions due to thoracic fracture and ACDF; needs TLSO for all OOB activity but per order set does not need cervical collar   Required Braces or Orthoses Spinal Brace  Spinal Brace TLSO;Applied in sitting position  Pain Assessment  Pain Assessment No/denies pain  Cognition  Arousal/Alertness Awake/alert  Behavior During Therapy Flat affect  Overall Cognitive Status Within Functional Limits for tasks assessed  General Comments for basic info   Other Exercises  Other Exercises splints removed and skin checked.  No evidence of pressure noted.  Established schedule for nighttime wear, sign posted over bed, nsg, daughter, and pt instructed on wear schedule with sign posted over bed.   OT - End of Session  Activity Tolerance Patient tolerated treatment well  Patient left in bed;with call bell/phone within  reach;with family/visitor present;with nursing/sitter in room  Nurse Communication Mobility status  OT Assessment/Plan  OT Plan Discharge plan remains appropriate  OT Visit Diagnosis Muscle weakness (generalized) (M62.81)  OT Frequency (ACUTE ONLY) Min 2X/week  Recommendations for Other Services Rehab consult  Follow Up Recommendations CIR;Supervision/Assistance - 24 hour  OT Equipment None recommended by OT  AM-PAC OT "6 Clicks" Daily Activity Outcome Measure  Help from another person eating meals? 1  Help from another person taking care of personal grooming? 1  Help from another person toileting, which includes using toliet, bedpan, or urinal? 1  Help from another person bathing (including washing, rinsing, drying)? 1  Help from another person to put on and taking off regular upper body clothing? 1  Help from another person to put on and taking off regular lower body clothing? 1  6 Click Score 6  ADL G Code Conversion CN  OT Goal Progression  Progress towards OT goals Progressing toward goals  OT Time Calculation  OT Start Time (ACUTE ONLY) 1423  OT Stop Time (ACUTE ONLY) 1438  OT Time Calculation (min) 15 min  OT General Charges  $OT Visit 1 Visit  OT Treatments  $Self Care/Home Management  8-22 mins  Lucille Passy, OTR/L Acute Rehabilitation Services Pager [phone] Office [phone]

## 2017-12-02 NOTE — Care Management Note (Signed)
Case Management Note  Patient Details  Name: NEKA BISE MRN: [id number] Date of Birth: Dec 20, 1934  Subjective/Objective:     Pt s/p cervical surgery. She is from home with her spouse.                Action/Plan: Recommendations are for CIR. CIR has started insurance. CM following .  Expected Discharge Date:                  Expected Discharge Plan:  [place]  In-House Referral:     Discharge planning Services  CM Consult  Post Acute Care Choice:    Choice offered to:     DME Arranged:    DME Agency:     HH Arranged:    [place] Agency:     Status of Service:  In process, will continue to follow  If discussed at Long Length of Stay Meetings, dates discussed:    Additional Comments:  Pollie Friar, RN 12/02/2017, 1:49 PM

## 2017-12-02 NOTE — Progress Notes (Signed)
IP rehab admissions - I met with patient and her 2 daughters at the bedside.  I gave them rehab brochures and discussed inpatient rehab program.  I have called [facility] and faxed information requesting acute inpatient rehab admission.  I will follow up on Monday.  Call me for questions.  [phone]

## 2017-12-03 MED ORDER — TAMSULOSIN HCL 0.4 MG PO CAPS
0.4000 mg | ORAL_CAPSULE | Freq: Every day | ORAL | Status: DC
  Administered 2017-12-03 – 2017-12-07 (×5): 0.4 mg via ORAL
  Filled 2017-12-03 (×5): qty 1

## 2017-12-03 NOTE — Progress Notes (Signed)
  NEUROSURGERY PROGRESS NOTE   Urinary retention overnight requiring I/O cath Pain manageable Denies worsening motor fucntion  EXAM:  BP (!) 119/52 (BP Location: Right Arm)   Pulse 76   Temp 98 F (36.7 C) (Oral)   Resp 18   Ht 5\' 7"  (1.702 m)   Wt 78.9 kg   SpO2 97%   BMI 27.25 kg/m   Awake, alert, oriented  Speech fluent, appropriate  CN grossly intact  BUE: 3/4-/5 BLE: 4/5  PLAN  Stable motor function Urinary  Retention: started flomax. May need foley if continues. Awaiting placement for CIR

## 2017-12-03 NOTE — Progress Notes (Signed)
Patient had in and out cath done yesterday @2318  and this morning @0550 . Still unable to void, encourage increased intake of fluid.Marland Kitchen

## 2017-12-03 NOTE — Progress Notes (Signed)
Order to insert a urethral catheter. Inserted catheter using sterile technique. P.t. Tolerated procedure well.

## 2017-12-04 MED ORDER — HYDROMORPHONE HCL 1 MG/ML IJ SOLN
1.0000 mg | INTRAMUSCULAR | Status: DC | PRN
  Administered 2017-12-04 – 2017-12-07 (×14): 1 mg via INTRAVENOUS
  Filled 2017-12-04 (×15): qty 1

## 2017-12-04 MED ORDER — ACETAMINOPHEN 500 MG PO TABS
1000.0000 mg | ORAL_TABLET | Freq: Four times a day (QID) | ORAL | Status: DC | PRN
  Administered 2017-12-04 – 2017-12-06 (×4): 1000 mg via ORAL
  Filled 2017-12-04 (×5): qty 2

## 2017-12-04 MED ORDER — METHOCARBAMOL 750 MG PO TABS
750.0000 mg | ORAL_TABLET | Freq: Four times a day (QID) | ORAL | Status: DC
  Administered 2017-12-04 – 2017-12-05 (×8): 750 mg via ORAL
  Filled 2017-12-04 (×8): qty 1

## 2017-12-04 NOTE — Progress Notes (Signed)
Physical Therapy Treatment Patient Details Name: Sherry [name] MRN: [id number] DOB: 1934-10-15 Today's Date: 12/04/2017    History of Present Illness Pt is an 83 y.o. female admitted 11/28/17 after falling and sustaining immediate quadriplegia; pt does not recall events. CT shows acute T11 fx. Cervical MRI showed C6-7 disc fx, tear of anterior longitudinal ligament, paraspinal muscle and interspinous edema from C1-7 compatible with ligament injury and muscle strain; likely cord contusion C5-6.  Head imaging negative for acute injury/changes. S/p ACDF C5-C7 on 11/29/17. Per MD assessment, symptoms consistent with possible central cord syndrome. PMH includes OA, HTN.   PT Comments     Pt demonstrating some return in extremities; LLE knee ext 3/5, knee flex 2/5, ankle 3-/5; RLE noted 1/5 adductor/quad activation with bed mobility. Able to sit EOB with mod-maxA, noting some core activation. TotalA+2 to perform lateral scoot transfer to recliner. Pt remains motivated to participate. Hopeful for intensive CIR-level therapies to maximize functional mobility and independence. Daughter present and supportive.   Follow Up Recommendations  CIR;Supervision for mobility/OOB     Equipment Recommendations  (TBD)    Recommendations for Other Services       Precautions / Restrictions Precautions Precautions: Fall;Cervical;Back;Other (comment) Precaution Comments: back precautions due to thoracic fx s/p ACDF; needs TLSO for all OOB activity but per order set does not need cervical collar  Required Braces or Orthoses: Spinal Brace Spinal Brace: Thoracolumbosacral orthotic;Applied in sitting position Restrictions Weight Bearing Restrictions: No    Mobility  Bed Mobility Overal bed mobility: Needs Assistance Bed Mobility: Rolling;Sidelying to Sit Rolling: Total assist Sidelying to sit: Total assist;+2 for physical assistance;+2 for safety/equipment       General bed mobility comments: TotalA  to assist BLEs and trunk elevation; difficult to determine how much R-side trunk activating upon sitting  Transfers Overall transfer level: Needs assistance   Transfers: Lateral/Scoot Transfers          Lateral/Scoot Transfers: Total assist;+2 physical assistance General transfer comment: TotalA+2 to scoot towards L-side to drop arm recliner; dependent to reposition R foot, able to minimally assist with L foot repositioning. Pad placed in chair for maximove lift for back to bed with nursing  Ambulation/Gait                 Stairs             Wheelchair Mobility    Modified Rankin (Stroke Patients Only)       Balance Overall balance assessment: Needs assistance;History of Falls Sitting-balance support: No upper extremity supported;Feet supported Sitting balance-Leahy Scale: Poor Sitting balance - Comments: Mod-maxA to maintain balance while sitting EOB; dependent to don TLSO while seated. Minimal trunk activation noted     Standing balance-Leahy Scale: Zero                              Cognition Arousal/Alertness: Awake/alert Behavior During Therapy: Flat affect Overall Cognitive Status: Within Functional Limits for tasks assessed                                        Exercises Other Exercises Other Exercises: LE AAROM/AROM    General Comments General comments (skin integrity, edema, etc.): SpO2 90% on 1.5L O2 [place]      Pertinent Vitals/Pain Pain Assessment: Faces Faces Pain Scale: Hurts even more Pain Location: LUE (mostly shoulder),  L-side back Pain Descriptors / Indicators: Aching;Grimacing;Sore Pain Intervention(s): Monitored during session;Limited activity within patient's tolerance;Repositioned    Home Living                      Prior Function            PT Goals (current goals can now be found in the care plan section) Acute Rehab PT Goals Patient Stated Goal: Pt very quiet, and did not state  goals; daughter present and hopeful for admission to CIR PT Goal Formulation: With patient/family Time For Goal Achievement: 12/15/17 Potential to Achieve Goals: Good Progress towards PT goals: Progressing toward goals    Frequency    Min 3X/week      PT Plan Current plan remains appropriate    Co-evaluation              AM-PAC PT "6 Clicks" Daily Activity  Outcome Measure  Difficulty turning over in bed (including adjusting bedclothes, sheets and blankets)?: Unable Difficulty moving from lying on back to sitting on the side of the bed? : Unable Difficulty sitting down on and standing up from a chair with arms (e.g., wheelchair, bedside commode, etc,.)?: Unable Help needed moving to and from a bed to chair (including a wheelchair)?: Total Help needed walking in hospital room?: Total Help needed climbing 3-5 steps with a railing? : Total 6 Click Score: 6    End of Session Equipment Utilized During Treatment: Oxygen Activity Tolerance: Patient limited by fatigue;Patient limited by pain Patient left: in chair;with call bell/phone within reach;Other (comment);with family/visitor present(with maxilift sling) Nurse Communication: Mobility status;Need for lift equipment PT Visit Diagnosis: Other abnormalities of gait and mobility (R26.89);Muscle weakness (generalized) (M62.81);Other symptoms and signs involving the nervous system (R29.898);Pain Pain - part of body: (Back, LUE)     Time: 7494-4967 PT Time Calculation (min) (ACUTE ONLY): 25 min  Charges:  $Therapeutic Activity: 8-22 mins $Neuromuscular Re-education: 8-22 mins                     Mabeline Caras, PT, DPT Acute Rehabilitation Services  Pager [phone] Office [place] 12/04/2017, 11:17 AM

## 2017-12-04 NOTE — Progress Notes (Signed)
  NEUROSURGERY PROGRESS NOTE   No issues overnight. Did require foley yesterday morning. Pain is often severe and brings her to tears. Denies change in motor exam  EXAM:  BP 136/74 (BP Location: Right Arm)   Pulse 90   Temp 98.7 F (37.1 C) (Oral)   Resp 18   Ht 5\' 7"  (1.702 m)   Wt 78.9 kg   SpO2 (!) 87%   BMI 27.25 kg/m   Awake, alert, oriented  Speech fluent, appropriate  CN grossly intact  BUE: 3/4-/5 BLE: 4-/5 Incision c/d/i, no palpable hematoma  PLAN Stable neurologically Will adjust pain meds for hopeful better relief  - D/C flexeril. Started robaxin  - Increased dilaudid  - Added tylenol Awaiting placement for CIR

## 2017-12-05 MED ORDER — BISACODYL 10 MG RE SUPP
10.0000 mg | Freq: Every day | RECTAL | Status: DC | PRN
  Administered 2017-12-05: 10 mg via RECTAL
  Filled 2017-12-05: qty 1

## 2017-12-05 MED ORDER — SORBITOL 70 % SOLN
30.0000 mL | Freq: Every day | Status: DC | PRN
  Administered 2017-12-05 – 2017-12-06 (×2): 30 mL via ORAL
  Filled 2017-12-05 (×2): qty 30

## 2017-12-05 NOTE — Progress Notes (Signed)
Occupational Therapy Treatment Patient Details Name: Sherry [name] MRN: [id number] DOB: [birth year] Today's Date: 12/05/2017    History of present illness Pt is an 83 y.o. female admitted 11/28/17 after falling and sustaining immediate quadriplegia; pt does not recall events. CT shows acute T11 fx. Cervical MRI showed C6-7 disc fx, tear of anterior longitudinal ligament, paraspinal muscle and interspinous edema from C1-7 compatible with ligament injury and muscle strain; likely cord contusion C5-6.  Head imaging negative for acute injury/changes. S/p ACDF C5-C7 on 11/29/17. Per MD assessment, symptoms consistent with possible central cord syndrome. PMH includes OA, HTN.   OT comments  Pt participated in bil. UE exercise at bed level.  She is in noticeable pain today, very restless, and difficulty concentrating.  Attempted repositioning several times.  She requested to defer OOB today.  Will attempt to borrow a tilt in space w/c from CIR next session.  Pt may benefit from an air mattress overlay for pressure relief. Will continue to follow.   Follow Up Recommendations  CIR;Supervision/Assistance - 24 hour    Equipment Recommendations  None recommended by OT    Recommendations for Other Services      Precautions / Restrictions Precautions Precautions: Fall;Cervical;Back;Other (comment) Precaution Booklet Issued: No Precaution Comments: back precautions due to thoracic fx s/p ACDF; needs TLSO for all OOB activity but per order set does not need cervical collar  Required Braces or Orthoses: Spinal Brace Spinal Brace: Thoracolumbosacral orthotic;Applied in sitting position       Mobility Bed Mobility                  Transfers                 General transfer comment: Pt requested to defer today due to pain     Balance                                           ADL either performed or assessed with clinical judgement   ADL                                                Vision       Perception     Praxis      Cognition Arousal/Alertness: Awake/alert;Lethargic Behavior During Therapy: Flat affect                                   General Comments: Pt  fell asleep multiple times during exercise.  Distracted by pain today.  Very difficult to get comfortable         Exercises General Exercises - Upper Extremity Shoulder Flexion: AAROM;AROM;Right;Left;10 reps;Supine Shoulder ABduction: AAROM;AROM;Right;Left;10 reps;Supine Elbow Flexion: AROM;Right;Left;10 reps Elbow Extension: AAROM;AROM;Right;Left;10 reps Wrist Flexion: AROM;Right;Left;10 reps;Supine Wrist Extension: AROM;Right;Left;5 reps;Supine Digit Composite Flexion: AAROM;Right;Left;10 reps;Supine   Shoulder Instructions       General Comments      Pertinent Vitals/ Pain       Pain Assessment: Faces Faces Pain Scale: Hurts even more Pain Location: "everywhere"   Pain Descriptors / Indicators: Restless(pt unable to describe ) Pain Intervention(s): Repositioned;Monitored during session  Home Living  Prior Functioning/Environment              Frequency  Min 2X/week        Progress Toward Goals  OT Goals(current goals can now be found in the care plan section)  Progress towards OT goals: Progressing toward goals     Plan Discharge plan remains appropriate    Co-evaluation                 AM-PAC PT "6 Clicks" Daily Activity     Outcome Measure   Help from another person eating meals?: Total Help from another person taking care of personal grooming?: Total Help from another person toileting, which includes using toliet, bedpan, or urinal?: Total Help from another person bathing (including washing, rinsing, drying)?: Total Help from another person to put on and taking off regular upper body clothing?: Total Help from another person to put  on and taking off regular lower body clothing?: Total 6 Click Score: 6    End of Session    OT Visit Diagnosis: Muscle weakness (generalized) (M62.81)   Activity Tolerance Patient limited by pain   Patient Left in bed;with call bell/phone within reach;with family/visitor present   Nurse Communication          Time: 2902-1115 OT Time Calculation (min): 52 min  Charges: OT General Charges $OT Visit: 1 Visit OT Treatments $Therapeutic Activity: 23-37 mins $Neuromuscular Re-education: 23-37 mins  Lucille Passy, OTR/L Acute Rehabilitation Services Pager [phone] Office [phone]    Lucille Passy M 12/05/2017, 3:23 PM

## 2017-12-05 NOTE — Progress Notes (Signed)
IP rehab admissions - I met with patient and her daughter this am.  I am waiting to hear back from [facility] about request for acute inpatient rehab admission.  Daughter wants to pay privately for inpatient rehab should we get a denial from insurance.  Daughter is fine to await a decision from the insurance carrier and then we will work on getting patient into inpatient rehab.  Call me for questions.  [phone]

## 2017-12-05 NOTE — Progress Notes (Signed)
Neurosurgery Service Progress Note  Subjective: No acute events overnight, pain better controlled yesterday / this morning  Objective: Vitals:   12/04/17 1923 12/04/17 2353 12/05/17 0311 12/05/17 0808  BP: 134/70 103/61 (!) 105/52 125/68  Pulse: 88 86 77 85  Resp: 18 18 18 18   Temp: 98.2 F (36.8 C) 98.8 F (37.1 C) 98.2 F (36.8 C) 98.6 F (37 C)  TempSrc: Oral Oral Oral Oral  SpO2: 98% 94% 92% 94%  Weight:      Height:       Temp (24hrs), Avg:98.4 F (36.9 C), Min:98 F (36.7 C), Max:98.8 F (37.1 C)  CBC Latest Ref Rng & Units 11/28/2017 11/28/2017  WBC 4.0 - 10.5 K/uL 8.9 -  Hemoglobin 12.0 - 15.0 g/dL 12.0 12.6  Hematocrit 36.0 - 46.0 % 38.5 37.0  Platelets 150 - 400 K/uL 232 -   BMP Latest Ref Rng & Units 11/28/2017 11/28/2017  Glucose 70 - 99 mg/dL 138(H) 134(H)  BUN 8 - 23 mg/dL 21 22  Creatinine 0.44 - 1.00 mg/dL 0.88 0.90  Sodium 135 - 145 mmol/L 138 140  Potassium 3.5 - 5.1 mmol/L 3.8 3.8  Chloride 98 - 111 mmol/L 108 104  CO2 22 - 32 mmol/L 24 -  Calcium 8.9 - 10.3 mg/dL 8.7(L) -    Intake/Output Summary (Last 24 hours) at 12/05/2017 1016 Last data filed at 12/05/2017 0700 Gross per 24 hour  Intake 120 ml  Output 1800 ml  Net -1680 ml    Current Facility-Administered Medications:  .  0.9 %  sodium chloride infusion, 250 mL, Intravenous, Continuous, Song Myre, Joyice Faster, MD, Stopped at 11/29/17 [phone] .  0.9 %  sodium chloride infusion, , Intravenous, Continuous, Aryona Sill, Joyice Faster, MD, Last Rate: 75 mL/hr at 12/01/17 1514 .  acetaminophen (TYLENOL) tablet 650 mg, 650 mg, Oral, Q4H PRN, 650 mg at 12/04/17 0820 **OR** acetaminophen (TYLENOL) suppository 650 mg, 650 mg, Rectal, Q4H PRN, Judith Part, MD .  acetaminophen (TYLENOL) tablet 1,000 mg, 1,000 mg, Oral, Q6H PRN, Costella, Vincent J, PA-C, 1,000 mg at 12/05/17 0846 .  amLODipine (NORVASC) tablet 10 mg, 10 mg, Oral, QPC supper, Judith Part, MD, 10 mg at 12/04/17 1803 .   dextromethorphan-guaiFENesin (MUCINEX DM) 30-600 MG per 12 hr tablet 1 tablet, 1 tablet, Oral, BID, Judith Part, MD, 1 tablet at 12/04/17 2232 .  gabapentin (NEURONTIN) capsule 100 mg, 100 mg, Oral, TID, Judith Part, MD, 100 mg at 12/04/17 2232 .  guaiFENesin-dextromethorphan (ROBITUSSIN DM) 100-10 MG/5ML syrup 5 mL, 5 mL, Oral, Q4H PRN, Judith Part, MD, 5 mL at 12/04/17 1448 .  heparin injection 5,000 Units, 5,000 Units, Subcutaneous, Q8H, Judith Part, MD, 5,000 Units at 12/04/17 [phone] .  HYDROmorphone (DILAUDID) injection 1 mg, 1 mg, Intravenous, Q3H PRN, Costella, Vincent J, PA-C, 1 mg at 12/05/17 0730 .  lactated ringers infusion, , Intravenous, Continuous, Guinn Delarosa, Joyice Faster, MD, Last Rate: 0 mL/hr at 11/29/17 1735 .  loratadine (CLARITIN) tablet 10 mg, 10 mg, Oral, Daily, Judith Part, MD, 10 mg at 12/04/17 0819 .  menthol-cetylpyridinium (CEPACOL) lozenge 3 mg, 1 lozenge, Oral, PRN, 3 mg at 11/30/17 0613 **OR** phenol (CHLORASEPTIC) mouth spray 1 spray, 1 spray, Mouth/Throat, PRN, Judith Part, MD .  methocarbamol (ROBAXIN) tablet 750 mg, 750 mg, Oral, QID, Costella, Vincent J, PA-C, 750 mg at 12/04/17 2232 .  metroNIDAZOLE (METROGEL) 9.41 % gel 1 application, 1 application, Topical, Daily PRN, Judith Part, MD .  multivitamin  liquid 15 mL, 15 mL, Oral, Daily, Judith Part, MD, 15 mL at 12/04/17 0818 .  ondansetron (ZOFRAN) tablet 4 mg, 4 mg, Oral, Q6H PRN **OR** ondansetron (ZOFRAN) injection 4 mg, 4 mg, Intravenous, Q6H PRN, Judith Part, MD, 4 mg at 11/29/17 1331 .  oxyCODONE (Oxy IR/ROXICODONE) immediate release tablet 10 mg, 10 mg, Oral, Q3H PRN, Judith Part, MD, 10 mg at 12/05/17 0439 .  oxyCODONE (Oxy IR/ROXICODONE) immediate release tablet 5 mg, 5 mg, Oral, Q3H PRN, Judith Part, MD, 5 mg at 12/05/17 0846 .  polyethylene glycol (MIRALAX / GLYCOLAX) packet 17 g, 17 g, Oral, Daily PRN, Judith Part,  MD, 17 g at 12/04/17 1043 .  sodium chloride (OCEAN) 0.65 % nasal spray 1 spray, 1 spray, Each Nare, PRN, Judith Part, MD, 1 spray at 12/01/17 1241 .  sodium chloride (OCEAN) 0.65 % nasal spray 1 spray, 1 spray, Each Nare, PRN, Judith Part, MD, 1 spray at 12/03/17 0910 .  sodium chloride flush (NS) 0.9 % injection 3 mL, 3 mL, Intravenous, Q12H, Molina Hollenback, Joyice Faster, MD, 3 mL at 12/04/17 2235 .  sodium chloride flush (NS) 0.9 % injection 3 mL, 3 mL, Intravenous, PRN, Judith Part, MD .  tamsulosin (FLOMAX) capsule 0.4 mg, 0.4 mg, Oral, Daily, Costella, Vincent J, PA-C, 0.4 mg at 12/04/17 0821 .  triamcinolone cream (KENALOG) 0.1 % 1 application, 1 application, Topical, Daily PRN, Judith Part, MD   Physical Exam: AOx3, PERRL, EOMI, FS, TM,  Strength 4-/5 proximally in BUE, 3/5 distally, 4-/5 in BLE Sensation improving Incision c/d/i  Assessment & Plan: 83 y.o. woman s/p fall with cervical extension injury and central cord syndrome, MRI w/ ALL and disc rupture and severe stenosis from C5-C7 with cord edema, 10/29 s/p C5-6/6-7 ACDF, recovering well. Other injuries include T11 coronal VB fracture. -TLSO for T11 fracture, uprights show stable alignment in brace -OOB as tolerated with TLSO brace on -PT/OT rec CIR, PM&R consulted, CIR transfer pending -no C-collar -SCDs/TEDs, SQH  -ready for discharge to CIR when available  Judith Part  12/05/17 10:16 AM

## 2017-12-05 NOTE — Discharge Summary (Addendum)
Discharge Summary  Date of Admission: 11/28/2017  Date of Discharge: 12/07/2017  Attending Physician: Emelda Brothers, MD  Hospital Course: Patient was admitted after a fall from standing with central cord syndrome. CT C/T/L-spine showed a T11 vertebral body fracture. MRI C-spine showed a C6-7 disc injury with ALL rupture and cord signal change at C5-6, MRI T-spine showed no cord compression. She was taken to the OR on 11/29/17 for a C5-6/C6-7 ACDF to decompress her spinal cord as well as stabilize her fracture. She was recovered in PACU and transferred to a regular nursing floor. Her hospital course was uncomplicated, she had expected slow recovery of her exam in a pattern consistent with central cord syndrome. She was seen by PT/OT, who recommended CIR. She was seen by PM&R who agreed and therefore the patient was discharged to CIR on 12/07/17. She will follow up in clinic with me in 2 weeks.  Neurologic exam at discharge:  AOx3, PERRL, EOMI, FS, TM,  Strength 4-/5 proximally in BUE, 3/5 distally, 4-/5 in BLE Sensation improving but decreased below C5 Incision c/d/i  Judith Part, MD  12/06/17 9:16 AM

## 2017-12-06 MED ORDER — OXYCODONE HCL 10 MG PO TABS: 10.0000 mg | ORAL_TABLET | ORAL | 0 refills | Status: DC | PRN

## 2017-12-06 MED ORDER — HEPARIN SODIUM (PORCINE) 5000 UNIT/ML IJ SOLN: 5000.0000 [IU] | Freq: Three times a day (TID) | INTRAMUSCULAR | Status: DC

## 2017-12-06 MED ORDER — SODIUM CHLORIDE 0.9 % IV SOLN
4.0000 mg | Freq: Three times a day (TID) | INTRAVENOUS | Status: AC
  Administered 2017-12-06 – 2017-12-07 (×2): 4 mg via INTRAVENOUS
  Filled 2017-12-06 (×2): qty 0.4

## 2017-12-06 MED ORDER — DM-GUAIFENESIN ER 30-600 MG PO TB12: 1.0000 | ORAL_TABLET | Freq: Two times a day (BID) | ORAL | Status: DC

## 2017-12-06 MED ORDER — OXYCODONE HCL 5 MG PO TABS: 5.0000 mg | ORAL_TABLET | ORAL | 0 refills | Status: DC | PRN

## 2017-12-06 MED ORDER — DIAZEPAM 5 MG PO TABS: 5.0000 mg | ORAL_TABLET | Freq: Four times a day (QID) | ORAL | 0 refills | Status: DC | PRN

## 2017-12-06 MED ORDER — DIAZEPAM 5 MG PO TABS
5.0000 mg | ORAL_TABLET | Freq: Four times a day (QID) | ORAL | Status: DC | PRN
  Administered 2017-12-06 (×2): 5 mg via ORAL
  Filled 2017-12-06 (×2): qty 1

## 2017-12-06 MED ORDER — SODIUM CHLORIDE 0.9 % IV SOLN
10.0000 mg | Freq: Once | INTRAVENOUS | Status: AC
  Administered 2017-12-06: 10 mg via INTRAVENOUS
  Filled 2017-12-06: qty 1

## 2017-12-06 MED ORDER — LORATADINE 10 MG PO TABS: 10.0000 mg | ORAL_TABLET | Freq: Every day | ORAL | Status: DC

## 2017-12-06 MED ORDER — GABAPENTIN 300 MG PO CAPS: 300.0000 mg | ORAL_CAPSULE | Freq: Three times a day (TID) | ORAL | Status: DC

## 2017-12-06 MED ORDER — MENTHOL 3 MG MT LOZG: 1.0000 | LOZENGE | OROMUCOSAL | 12 refills | Status: DC | PRN

## 2017-12-06 MED ORDER — GABAPENTIN 300 MG PO CAPS
300.0000 mg | ORAL_CAPSULE | Freq: Three times a day (TID) | ORAL | Status: DC
  Administered 2017-12-06 – 2017-12-07 (×5): 300 mg via ORAL
  Filled 2017-12-06 (×5): qty 1

## 2017-12-06 MED ORDER — TAMSULOSIN HCL 0.4 MG PO CAPS: 0.4000 mg | ORAL_CAPSULE | Freq: Every day | ORAL | Status: DC

## 2017-12-06 NOTE — PMR Pre-admission (Addendum)
PMR Admission Coordinator Pre-Admission Assessment  Patient: Sherry [name] is an 83 y.o., female MRN: [id number] DOB: 1934-12-05 Height: 5' 7" (170.2 cm) Weight: 78.9 kg              Insurance Information HMO:     PPO: Yes     PCP:       IPA:       80/20:       OTHER:    PRIMARY: [employer] medicare      Policy#: [id number]     Subscriber: patient CM Name: MServando Salina     Phone#: [phone]    Fax#: [phone]-Cert#: [id number] 11/6 to 12/12/17 with update due on 12/13/17      Employer: Not working in the past year Benefits:  Phone #: [phone]    Name: Online portal Eff. 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  Per chart review lives with spouse.  Independent prior to admission.  Multilevel home with bed and bath on main level.  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  She has multiple family in the area.  Presented 11/29/2017 after a fall with immediate quadriparesis.  Cranial CT scan negative.  CT/MRI imaging showed C6-7 disc fracture and tear of anterior longitudinal ligament.  Paraspinal muscle and interspinous edema  from C1-C7 compatible ligament injury and muscle strain.  Increased cord signal at C5 and C6.  No cord hemorrhage and also noted T11 coronal vertebral body fracture.  Underwent C5-6, C6-7 anterior cervical discectomy and fusion 11/29/2017 per Dr. OVenetia Constable  TLSO back brace when out of bed applied in sitting position.  Hospital course pain management.  Subcutaneous heparin for DVT prophylaxis.  Therapy evaluations completed with recommendations of physical medicine rehab consult. Patient to be admitted for a comprehensive inpatient rehab program.  Past Medical History  Past Medical History:  Diagnosis Date  . Arthritis   . Hypertension     Family History  family history is not on file.  Prior Rehab/Hospitalizations: No previous rehab.  No previous hospitalizations.  Has the patient had major surgery during 100 days prior to admission? No  Current Medications   Current Facility-Administered Medications:  .  0.9 %  sodium chloride infusion, 250 mL, Intravenous, Continuous, Ostergard, TJoyice Faster MD, Stopped at 11/29/17 [phone].  0.9 %  sodium chloride infusion, , Intravenous, Continuous, Ostergard, TJoyice Faster MD, Last Rate: 75 mL/hr at 12/01/17 1514 .  acetaminophen (TYLENOL)  tablet 650 mg, 650 mg, Oral, Q4H PRN, 650 mg at 12/05/17 1213 **OR** acetaminophen (TYLENOL) suppository 650 mg, 650 mg, Rectal, Q4H PRN, Judith Part, MD .  acetaminophen (TYLENOL) tablet 1,000 mg, 1,000 mg, Oral, Q6H PRN, Costella, Vincent J, PA-C, 1,000 mg at 12/06/17 0750 .  amLODipine (NORVASC) tablet 10 mg, 10 mg, Oral, QPC supper, Judith Part, MD, 10 mg at 12/06/17 1842 .  bisacodyl (DULCOLAX) suppository 10 mg, 10 mg, Rectal, Daily PRN, Judith Part, MD, 10 mg at 12/05/17 2208 .  dexamethasone (DECADRON) 4 mg in sodium chloride 0.9 % 50 mL IVPB, 4 mg, Intravenous, Q8H, Ostergard, Thomas A, MD, Last Rate: 202 mL/hr at 12/07/17 1239, 4 mg at 12/07/17 1239 .  dextromethorphan-guaiFENesin (MUCINEX  DM) 30-600 MG per 12 hr tablet 1 tablet, 1 tablet, Oral, BID, Judith Part, MD, 1 tablet at 12/07/17 0916 .  diazepam (VALIUM) tablet 5 mg, 5 mg, Oral, Q6H PRN, Judith Part, MD, 5 mg at 12/06/17 2226 .  gabapentin (NEURONTIN) capsule 300 mg, 300 mg, Oral, TID, Judith Part, MD, 300 mg at 12/07/17 0916 .  guaiFENesin-dextromethorphan (ROBITUSSIN DM) 100-10 MG/5ML syrup 5 mL, 5 mL, Oral, Q4H PRN, Judith Part, MD, 5 mL at 12/06/17 1010 .  heparin injection 5,000 Units, 5,000 Units, Subcutaneous, Q8H, Judith Part, MD, 5,000 Units at 12/04/17 [phone] .  HYDROmorphone (DILAUDID) injection 1 mg, 1 mg, Intravenous, Q3H PRN, Costella, Vincent J, PA-C, 1 mg at 12/07/17 0700 .  lactated ringers infusion, , Intravenous, Continuous, Ostergard, Joyice Faster, MD, Last Rate: 0 mL/hr at 11/29/17 1735 .  loratadine (CLARITIN) tablet 10 mg, 10 mg, Oral, Daily, Judith Part, MD, 10 mg at 12/07/17 0916 .  menthol-cetylpyridinium (CEPACOL) lozenge 3 mg, 1 lozenge, Oral, PRN, 3 mg at 11/30/17 0613 **OR** phenol (CHLORASEPTIC) mouth spray 1 spray, 1 spray, Mouth/Throat, PRN, Ostergard, Thomas A, MD .  metroNIDAZOLE (METROGEL) 5.63 % gel 1 application, 1 application, Topical, Daily PRN, Judith Part, MD .  multivitamin liquid 15 mL, 15 mL, Oral, Daily, Judith Part, MD, 15 mL at 12/07/17 0916 .  ondansetron (ZOFRAN) tablet 4 mg, 4 mg, Oral, Q6H PRN **OR** ondansetron (ZOFRAN) injection 4 mg, 4 mg, Intravenous, Q6H PRN, Judith Part, MD, 4 mg at 11/29/17 1331 .  oxyCODONE (Oxy IR/ROXICODONE) immediate release tablet 10 mg, 10 mg, Oral, Q3H PRN, Judith Part, MD, 10 mg at 12/07/17 0515 .  oxyCODONE (Oxy IR/ROXICODONE) immediate release tablet 5 mg, 5 mg, Oral, Q3H PRN, Judith Part, MD, 5 mg at 12/07/17 1523 .  polyethylene glycol (MIRALAX / GLYCOLAX) packet 17 g, 17 g, Oral, Daily PRN, Judith Part, MD, 17 g at 12/06/17 1009 .  sodium chloride  (OCEAN) 0.65 % nasal spray 1 spray, 1 spray, Each Nare, PRN, Judith Part, MD, 1 spray at 12/01/17 1241 .  sodium chloride (OCEAN) 0.65 % nasal spray 1 spray, 1 spray, Each Nare, PRN, Judith Part, MD, 1 spray at 12/03/17 0910 .  sodium chloride flush (NS) 0.9 % injection 3 mL, 3 mL, Intravenous, Q12H, Ostergard, Thomas A, MD, 3 mL at 12/07/17 1244 .  sodium chloride flush (NS) 0.9 % injection 3 mL, 3 mL, Intravenous, PRN, Ostergard, Thomas A, MD .  sorbitol 70 % solution 30 mL, 30 mL, Oral, Daily PRN, Judith Part, MD, 30 mL at 12/06/17 1013 .  tamsulosin (FLOMAX) capsule 0.4 mg, 0.4 mg, Oral, Daily, Costella, Vincent J, PA-C, 0.4  mg at 12/07/17 0916 .  triamcinolone cream (KENALOG) 0.1 % 1 application, 1 application, Topical, Daily PRN, Judith Part, MD  Patients Current Diet:  Diet Order            Diet regular Room service appropriate? Yes; Fluid consistency: Thin  Diet effective now              Precautions / Restrictions Precautions Precautions: Fall, Cervical, Back, Other (comment) Precaution Booklet Issued: No Precaution Comments: back precautions due to thoracic fx s/p ACDF; needs TLSO for all OOB activity but per order set does not need cervical collar  Spinal Brace: Thoracolumbosacral orthotic, Applied in sitting position(today applied in supine for testing) Restrictions Weight Bearing Restrictions: No   Has the patient had 2 or more falls or a fall with injury in the past year?Yes.  Patient has had one fall which resulted in current hospital admission with significant injury.  Prior Activity Level Community (5-7x/wk): Went out 3-4 days a week, was driving.  Home Assistive Devices / Equipment Home Assistive Devices/Equipment: None Home Equipment: None  Prior Device Use: Indicate devices/aids used by the patient prior to current illness, exacerbation or injury? None of the above and None  Prior Functional Level Prior Function Level of  Independence: Independent Comments: caregiver for husband, who has memory problems,  and 2 other family members, very independent at baseline   Self Care: Did the patient need help bathing, dressing, using the toilet or eating?  Independent  Indoor Mobility: Did the patient need assistance with walking from room to room (with or without device)? Independent  Stairs: Did the patient need assistance with internal or external stairs (with or without device)? Independent  Functional Cognition: Did the patient need help planning regular tasks such as shopping or remembering to take medications? Independent  Current Functional Level Cognition  Overall Cognitive Status: Within Functional Limits for tasks assessed Orientation Level: Oriented X4 General Comments: Pt  fell asleep multiple times during exercise.  Distracted by pain today.  Very difficult to get comfortable     Extremity Assessment (includes Sensation/Coordination)  Upper Extremity Assessment: RUE deficits/detail RUE Deficits / Details: triceps 2/5; hand 1/5 (able to palpate contraction and minimal visible small amount of extension index finger with max effort  RUE Sensation: decreased light touch, decreased proprioception RUE Coordination: decreased fine motor, decreased gross motor LUE Deficits / Details: tricep 2/5; 1/5 finger flex/ext - able to visibly see small excursion of movement with max effort from pt (wrist stabilized in neutral)  LUE Sensation: decreased light touch, decreased proprioception LUE Coordination: decreased fine motor, decreased gross motor  Lower Extremity Assessment: Defer to PT evaluation RLE Deficits / Details: MMT grossly 2- to 2/5 B LEs  RLE Sensation: decreased light touch, decreased proprioception RLE Coordination: decreased fine motor, decreased gross motor LLE Deficits / Details: MMT grossly 2- to 2/5 B LEs  LLE Sensation: decreased light touch, decreased proprioception LLE Coordination:  decreased fine motor, decreased gross motor    ADLs  Overall ADL's : Needs assistance/impaired Eating/Feeding: Total assistance, Bed level Grooming: Wash/dry hands, Wash/dry face, Oral care, Brushing hair, Total assistance, Bed level Upper Body Bathing: Total assistance, Bed level Lower Body Bathing: Total assistance, Bed level Upper Body Dressing : Total assistance, Bed level Lower Body Dressing: Total assistance, Bed level Toilet Transfer: Total assistance Toilet Transfer Details (indicate cue type and reason): unable  Toileting- Clothing Manipulation and Hygiene: Total assistance, Bed level Functional mobility during ADLs: Total assistance, +2 for physical  assistance General ADL Comments: requires total A for all aspects     Mobility  Overal bed mobility: Needs Assistance Bed Mobility: Rolling Rolling: Total assist Sidelying to sit: Total assist, +2 for physical assistance, +2 for safety/equipment Supine to sit: +2 for physical assistance, Max assist, +2 for safety/equipment Sit to sidelying: Total assist, +2 for physical assistance, +2 for safety/equipment General bed mobility comments: pt practicing roll in a normal movement pattern many times to donn TLSO for MBS testing    Transfers  Overall transfer level: Needs assistance Transfers: Lateral/Scoot Transfers  Lateral/Scoot Transfers: Total assist, +2 physical assistance General transfer comment: cues and hand over hand assist of UE's and truncal assist    Ambulation / Gait / Stairs / Wheelchair Mobility  Ambulation/Gait General Gait Details: DNT, fatigue     Posture / Balance Dynamic Sitting Balance Sitting balance - Comments: max assist >5 min on balance at EOB.  limited by back pain. Balance Overall balance assessment: Needs assistance, History of Falls Sitting-balance support: Bilateral upper extremity supported, No upper extremity supported Sitting balance-Leahy Scale: Poor Sitting balance - Comments: max assist >5  min on balance at EOB.  limited by back pain. Standing balance-Leahy Scale: Zero High Level Balance Comments: Pt unable to stand     Special needs/care consideration BiPAP/CPAP No CPM No Continuous Drip IV No Dialysis No       Life Vest No Oxygen No Special Bed No Trach Size no Wound Vac (area) No     Skin Has cervical neck post op incision and a dressing to left elbow injury                              Bowel mgmt: Last BM 11/28/17 Bladder mgmt: Urinary catheter Diabetic mgmt No    Previous Home Environment Living Arrangements: Spouse/significant other Available Help at Discharge: Family, Available PRN/intermittently Type of Home: House Home Layout: Multi-level, Laundry or work area in basement, Able to live on main level with bedroom/bathroom Alternate Level Stairs-Number of Steps: flight, does not need to go to basement as family can assist  Home Access: Stairs to enter Entrance Stairs-Rails: Can reach both Technical brewer of Steps: 3 Bathroom Shower/Tub: Chiropodist: Standard Home Care Services: No Additional Comments: family reports they also own a separate home that is level and has ramped enterance that she could stay at   Discharge Living Setting Plans for Discharge Living Setting: Patient's home, Lives with (comment)(Lives with husband.) Type of Home at Discharge: House Discharge Home Layout: Two level, Laundry or work area in basement, Able to live on main level with bedroom/bathroom(Laundry in basement.) Alternate Level Stairs-Number of Steps: Flight to basement Discharge Home Access: Stairs to enter Technical brewer of Steps: 3 steps at the front entry Discharge Bathroom Shower/Tub: Tub/shower unit, Door Discharge Bathroom Toilet: Standard Discharge Bathroom Accessibility: Yes How Accessible: Accessible via walker Does the patient have any problems obtaining your medications?: No  Social/Family/Support Systems Patient Roles:  Spouse, Parent(Has disabled husband and daughters.) Contact Information: [name] - daughter Anticipated Caregiver: Daughters and hired help Anticipated Ambulance person Information: Waylan Rocher - daughter - [phone] Ability/Limitations of Caregiver: 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  Daughters work.  Family is financially able to hire help as needed. Caregiver Availability: Other (Comment)(Family understands that patient likely to need 24/7 assist.) Discharge Plan Discussed with Primary Caregiver: Yes Is Caregiver In Agreement with Plan?: Yes Does  Caregiver/Family have Issues with Lodging/Transportation while Pt is in Rehab?: No  Goals/Additional Needs Patient/Family Goal for Rehab: PT/OT min assist W/C level goals Expected length of stay: 21-27 days Cultural Considerations: None Dietary Needs: Regular diet, thin liquids Equipment Needs: TBD Pt/Family Agrees to Admission and willing to participate: Yes Program Orientation Provided & Reviewed with Pt/Caregiver Including Roles  & Responsibilities: Yes  Decrease burden of Care through IP rehab admission: Decrease number of caregivers, Bowel and bladder program and Patient/family education  Possible need for SNF placement upon discharge: Yes.  Family hopes to hire assistance and provide care at home.  They are financially able to hire assistance.  Patient Condition: This patient's medical and functional status has changed since the consult dated: 12/01/17 in which the Rehabilitation Physician determined and documented that the patient's condition is appropriate for intensive rehabilitative care in an inpatient rehabilitation facility. See "History of Present Illness" (above) for medical update. Functional changes are: Currently requiring total assist +2 for all mobility. Patient's medical and functional status update has been discussed with the Rehabilitation physician and patient remains appropriate for inpatient  rehabilitation. Will admit to inpatient rehab today.  Preadmission Screen Completed By:  Retta Diones, 12/07/2017 3:25 PM ______________________________________________________________________   Discussed status with Dr. Posey Pronto on 12/07/17 at 1409 and received telephone approval for admission today.  Admission Coordinator:  Retta Diones, time 1409/Date 12/07/17

## 2017-12-06 NOTE — Progress Notes (Addendum)
Physical Therapy Treatment Patient Details Name: Sherry [name] MRN: [id number] DOB: 13-Jan-1935 Today's Date: 12/06/2017    History of Present Illness Pt is an 83 y.o. female admitted 11/28/17 after falling and sustaining immediate quadriplegia; pt does not recall events. CT shows acute T11 fx. Cervical MRI showed C6-7 disc fx, tear of anterior longitudinal ligament, paraspinal muscle and interspinous edema from C1-7 compatible with ligament injury and muscle strain; likely cord contusion C5-6.  Head imaging negative for acute injury/changes. S/p ACDF C5-C7 on 11/29/17. Per MD assessment, symptoms consistent with possible central cord syndrome. PMH includes OA, HTN.    PT Comments    Pt tolerate treatment longer with less pain today.  Continued to work on the scoot transfer and then gain good positioning in the tilt in space w/c.   Follow Up Recommendations  CIR;Supervision for mobility/OOB     Equipment Recommendations  Other (comment)    Recommendations for Other Services Rehab consult     Precautions / Restrictions Precautions Precautions: Fall;Cervical;Back;Other (comment) Precaution Comments: back precautions due to thoracic fx s/p ACDF; needs TLSO for all OOB activity but per order set does not need cervical collar  Required Braces or Orthoses: Spinal Brace Spinal Brace: Thoracolumbosacral orthotic;Applied in sitting position    Mobility  Bed Mobility Overal bed mobility: Needs Assistance Bed Mobility: Rolling;Sidelying to Sit Rolling: Total assist Sidelying to sit: Total assist;+2 for physical assistance;+2 for safety/equipment       General bed mobility comments: cues for sequence, hand over hand UE asssit and truncal assist  Transfers Overall transfer level: Needs assistance   Transfers: Lateral/Scoot Transfers          Lateral/Scoot Transfers: Total assist;+2 physical assistance General transfer comment: cues and hand over hand assist of UE's and truncal  assist  Ambulation/Gait                 Stairs             Wheelchair Mobility    Modified Rankin (Stroke Patients Only)       Balance Overall balance assessment: Needs assistance;History of Falls Sitting-balance support: Bilateral upper extremity supported;No upper extremity supported Sitting balance-Leahy Scale: Poor Sitting balance - Comments: max assist >5 min on balance at EOB.  limited by back pain.     Standing balance-Leahy Scale: Zero                              Cognition Arousal/Alertness: Awake/alert Behavior During Therapy: Flat affect;WFL for tasks assessed/performed Overall Cognitive Status: Within Functional Limits for tasks assessed                                        Exercises Other Exercises Other Exercises: aa/prom bil LEs    General Comments General comments (skin integrity, edema, etc.): Worked on positioning in tilt in space w/c      Pertinent Vitals/Pain Pain Assessment: Faces Faces Pain Scale: Hurts little more Pain Location: left shd blade and back Pain Descriptors / Indicators: Discomfort Pain Intervention(s): Monitored during session    Home Living                      Prior Function            PT Goals (current goals can now be found in the care plan section)  Acute Rehab PT Goals PT Goal Formulation: With patient/family Time For Goal Achievement: 12/15/17 Potential to Achieve Goals: Good Progress towards PT goals: Progressing toward goals    Frequency    Min 3X/week      PT Plan Current plan remains appropriate    Co-evaluation              AM-PAC PT "6 Clicks" Daily Activity  Outcome Measure  Difficulty turning over in bed (including adjusting bedclothes, sheets and blankets)?: Unable Difficulty moving from lying on back to sitting on the side of the bed? : Unable Difficulty sitting down on and standing up from a chair with arms (e.g., wheelchair,  bedside commode, etc,.)?: Unable Help needed moving to and from a bed to chair (including a wheelchair)?: Total Help needed walking in hospital room?: Total Help needed climbing 3-5 steps with a railing? : Total 6 Click Score: 6    End of Session   Activity Tolerance: Patient tolerated treatment well;Patient limited by pain Patient left: in chair;with call bell/phone within reach(in tilt in space w/c) Nurse Communication: Mobility status;Need for lift equipment PT Visit Diagnosis: Other abnormalities of gait and mobility (R26.89);Muscle weakness (generalized) (M62.81);Other symptoms and signs involving the nervous system (R29.898);Pain     Time: 2542-7062 PT Time Calculation (min) (ACUTE ONLY): 48 min  Charges:  $Therapeutic Activity: 23-37 mins $Neuromuscular Re-education: 8-22 mins                     12/06/2017  Donnella Sham, PT Acute Rehabilitation Services [phone]  (pager) [phone]  (office)   Sherry [name] 12/06/2017, 5:30 PM

## 2017-12-06 NOTE — Evaluation (Signed)
Clinical/Bedside Swallow Evaluation Patient Details  Name: Sherry [name] MRN: [id number] Date of Birth: [birth year]  Today's Date: 12/06/2017 Time: SLP Start Time (ACUTE ONLY): 0263 SLP Stop Time (ACUTE ONLY): 1513 SLP Time Calculation (min) (ACUTE ONLY): 14 min  Past Medical History:  Past Medical History:  Diagnosis Date  . Arthritis   . Hypertension    Past Surgical History:  Past Surgical History:  Procedure Laterality Date  . ANTERIOR CERVICAL DECOMP/DISCECTOMY FUSION N/A 11/29/2017   Procedure: Cervical five-six, six-seven Anterior Cervical Decompression Fusion;  Surgeon: Judith Part, MD;  Location: [place];  Service: Neurosurgery;  Laterality: N/A;  Cervical five-six, six-seven Anterior Cervical Decompression Fusion   HPI:  Pt is a 83 y.o. woman with PMH significant for HTN. She presented to [facility] ED 11/28/17 s/p fall with immediate quadriplegia; per MD notes, she has regained some LE function, does not recall the events. Underwent C5-6, C6-7 anterior cervical discectomy and fusion 11/29/2017 per Dr. Venetia Constable - was intubated and extubated same day during this procedure. Per RN note 11/5, pt reports difficulty swallowing.   Assessment / Plan / Recommendation Clinical Impression  Pt was alert and pleasant throughout bedside swallow evaluation. Pt's daughter at bedside supportive and provided baseline information regarding swallowing and associated worsening of symptoms over the last day. Pt reported feeling strangled during eating and drinking as well as pressure/sensation of something "being stuck" on the right side of her throat with POs of all consistencies. Of note, pt also reported feeling SOB particularly in the morning and requires rest breaks to coordinate respirations during eating and drinking. Her volitional and reflexive cough noted to be very weak. She was unable to consume 3oz water without stopping and exhibited immediate cough and facial grimace with all thin  liquid trials. Immediate throat clear also observed following all puree trials. SLP educated pt and family regarding aspiration precautions and benefit of objective testing given pt's reports and clinical concern for aspiration. Recommend continue regular diet and thin liquids for now using small bites/sips, slow rate with rest breaks as needed to coordinate respirations, seated upright. ST will defer treatment plan until instrumental MBSS completed tomorrow.   SLP Visit Diagnosis: Dysphagia, unspecified (R13.10)    Aspiration Risk  Moderate aspiration risk    Diet Recommendation Thin liquid;Regular   Liquid Administration via: Cup;Straw Medication Administration: Other (Comment)(whole or cut in applesauce) Supervision: Staff to assist with self feeding Compensations: Slow rate;Small sips/bites Postural Changes: Seated upright at 90 degrees    Other  Recommendations Oral Care Recommendations: Oral care BID   Follow up Recommendations Other (comment)(TBD)      Frequency and Duration            Prognosis        Swallow Study   General Date of Onset: 11/28/17 HPI: Pt is a 83 y.o. woman with PMH significant for HTN. She [facility] to [facility] ED 11/28/17 s/p fall with immediate quadriplegia; per MD notes, she has regained some LE function, does not recall the events. Underwent C5-6, C6-7 anterior cervical discectomy and fusion 11/29/2017 per Dr. Venetia Constable - was intubated and extubated same day during this procedure. Per RN note 11/5, pt reports difficulty swallowing. Type of Study: Bedside Swallow Evaluation Previous Swallow Assessment: none found in chart Diet Prior to this Study: Regular;Thin liquids Temperature Spikes Noted: No Respiratory Status: Room air History of Recent Intubation: Yes Length of Intubations (days): 1 days Date extubated: 11/29/17 Behavior/Cognition: Alert;Cooperative;Pleasant mood Oral Cavity Assessment: Within Functional Limits Oral  Care Completed by SLP:  No Oral Cavity - Dentition: Adequate natural dentition Vision: Functional for self-feeding Self-Feeding Abilities: Total assist Patient Positioning: Upright in chair Baseline Vocal Quality: Normal Volitional Cough: Weak Volitional Swallow: Able to elicit    Oral/Motor/Sensory Function Overall Oral Motor/Sensory Function: Within functional limits   Ice Chips Ice chips: Not tested   Thin Liquid Thin Liquid: Impaired Presentation: Cup;Straw Oral Phase Impairments: (none) Oral Phase Functional Implications: (none) Pharyngeal  Phase Impairments: Cough - Immediate    Nectar Thick Nectar Thick Liquid: Not tested   Honey Thick Honey Thick Liquid: Not tested   Puree Puree: Impaired Presentation: Spoon Oral Phase Impairments: (none) Oral Phase Functional Implications: (none) Pharyngeal Phase Impairments: Throat Clearing - Immediate   Solid     Solid: Not tested     Jettie Booze, Student SLP  Jettie Booze 12/06/2017,4:12 PM

## 2017-12-06 NOTE — Discharge Instructions (Signed)
Discharge Instructions  No restriction in activities, slowly increase your activity back to normal.   Your incision is closed with dermabond (purple glue). This will naturally fall off over the next 1-2 weeks.   Okay to shower on the day of discharge. Use regular soap and water and try to be gentle when cleaning your incision.   Follow up with Dr. Zada Finders in 2 weeks after discharge. If you do not already have a discharge appointment, please call his office at [phone] to schedule a follow up appointment. If you have any concerns or questions, please call the office and let us know. If you are still at rehab at 2 weeks after surgery, please let his office know.

## 2017-12-06 NOTE — Progress Notes (Signed)
Pt c/o difficulty swallowing, MD notified, ST evaluation ordered. Pt refuses Heparin shots due to allergies to beef and pork derivative products.   Pain remains unchanged. Frequent calls for pain medication. Family states " we want to be on top of the pain"  Education provided for non pharmacological pain management. Re postioning/rest. Medication metabolization in older adult reviewed with safety of medication administration. Pt/family verbalized understanding. Overview of all current medication and time of administration reviewed by RN and patients daughter.  RN will continue to assess and follow up with pain medication administration as ordered by MD

## 2017-12-06 NOTE — Progress Notes (Signed)
Afternoon Note   Post sitting in  A cushionedTilt in Space W/C 3.5 hours.    12/06/17 1732  PT Visit Information  Last PT Received On 12/06/17  Assistance Needed +2  History of Present Illness Pt is an 83 y.o. female admitted 11/28/17 after falling and sustaining immediate quadriplegia; pt does not recall events. CT shows acute T11 fx. Cervical MRI showed C6-7 disc fx, tear of anterior longitudinal ligament, paraspinal muscle and interspinous edema from C1-7 compatible with ligament injury and muscle strain; likely cord contusion C5-6.  Head imaging negative for acute injury/changes. S/p ACDF C5-C7 on 11/29/17. Per MD assessment, symptoms consistent with possible central cord syndrome. PMH includes OA, HTN.  Precautions  Precautions Fall;Cervical;Back;Other (comment)  Precaution Comments back precautions due to thoracic fx s/p ACDF; needs TLSO for all OOB activity but per order set does not need cervical collar   Required Braces or Orthoses Spinal Brace  Spinal Brace TLSO;Applied in sitting position  Pain Assessment  Pain Assessment Faces  Faces Pain Scale 6  Pain Location left shd blade/arm and back  Pain Descriptors / Indicators Discomfort;Grimacing  Pain Intervention(s) Monitored during session  Cognition  Arousal/Alertness Awake/alert  Behavior During Therapy WFL for tasks assessed/performed  Overall Cognitive Status Within Functional Limits for tasks assessed  Bed Mobility  Bed Mobility Sit to Sidelying;Rolling  Rolling Total assist  Sit to sidelying Total assist;+2 for physical assistance;+2 for safety/equipment  General bed mobility comments cues for technique and supportive and lifting assist  Transfers  Overall transfer level Needs assistance  Transfers Lateral/Scoot Transfers   Lateral/Scoot Transfers Total assist;+2 physical assistance  General transfer comment cues and hand over hand assist of UE's and truncal assist  General Comments  General comments (skin integrity,  edema, etc.) pt sat up in tilt in space w/c for 3.5 hours.  Pt's dtr repositioned the chair every 30 min.  Extensive assisted movement for pericare.  PT - End of Session  Patient left in bed;with call bell/phone within reach;with bed alarm set;with family/visitor present  Nurse Communication Mobility status;Need for lift equipment   PT - Assessment/Plan  PT Plan Current plan remains appropriate  PT Visit Diagnosis Other abnormalities of gait and mobility (R26.89);Muscle weakness (generalized) (M62.81);Other symptoms and signs involving the nervous system (R29.898);Pain  PT Frequency (ACUTE ONLY) Min 3X/week  Recommendations for Other Services Rehab consult  Follow Up Recommendations CIR;Supervision for mobility/OOB  PT equipment Other (comment)  AM-PAC PT "6 Clicks" Daily Activity Outcome Measure  Difficulty turning over in bed (including adjusting bedclothes, sheets and blankets)? 1  Difficulty moving from lying on back to sitting on the side of the bed?  1  Difficulty sitting down on and standing up from a chair with arms (e.g., wheelchair, bedside commode, etc,.)? 1  Help needed moving to and from a bed to chair (including a wheelchair)? 1  Help needed walking in hospital room? 1  Help needed climbing 3-5 steps with a railing?  1  6 Click Score 6  Mobility G Code  CN  PT Goal Progression  Progress towards PT goals Progressing toward goals  Acute Rehab PT Goals  PT Goal Formulation With patient/family  Time For Goal Achievement 12/15/17  Potential to Achieve Goals Good  PT Time Calculation  PT Start Time (ACUTE ONLY) 1620  PT Stop Time (ACUTE ONLY) 1650  PT Time Calculation (min) (ACUTE ONLY) 30 min  PT General Charges  $$ ACUTE PT VISIT 1 Visit  PT Treatments  $Therapeutic Activity 23-37  mins   12/06/2017  Donnella Sham, PT Acute Rehabilitation Services [phone]  (pager) [phone]  (office)

## 2017-12-06 NOTE — Progress Notes (Signed)
Neurosurgery Service Progress Note  Subjective: No acute events overnight, feels like her swallowing has gotten worse, difficulty with liquids but okay with crushed meds / applesauce  Objective: Vitals:   12/05/17 1944 12/05/17 2312 12/06/17 0448 12/06/17 0745  BP: (!) 107/57 (!) 112/56 124/64 128/72  Pulse: 74 84 77 84  Resp: 20 20 20 18   Temp: 98.6 F (37 C) 98.6 F (37 C) 98.4 F (36.9 C) 98.5 F (36.9 C)  TempSrc: Oral Oral Oral Oral  SpO2: 94% 92% 93% 92%  Weight:      Height:       Temp (24hrs), Avg:98.7 F (37.1 C), Min:98.4 F (36.9 C), Max:99.3 F (37.4 C)  CBC Latest Ref Rng & Units 11/28/2017 11/28/2017  WBC 4.0 - 10.5 K/uL 8.9 -  Hemoglobin 12.0 - 15.0 g/dL 12.0 12.6  Hematocrit 36.0 - 46.0 % 38.5 37.0  Platelets 150 - 400 K/uL 232 -   BMP Latest Ref Rng & Units 11/28/2017 11/28/2017  Glucose 70 - 99 mg/dL 138(H) 134(H)  BUN 8 - 23 mg/dL 21 22  Creatinine 0.44 - 1.00 mg/dL 0.88 0.90  Sodium 135 - 145 mmol/L 138 140  Potassium 3.5 - 5.1 mmol/L 3.8 3.8  Chloride 98 - 111 mmol/L 108 104  CO2 22 - 32 mmol/L 24 -  Calcium 8.9 - 10.3 mg/dL 8.7(L) -    Intake/Output Summary (Last 24 hours) at 12/06/2017 0844 Last data filed at 12/05/2017 1900 Gross per 24 hour  Intake 240 ml  Output 1400 ml  Net -1160 ml    Current Facility-Administered Medications:  .  0.9 %  sodium chloride infusion, 250 mL, Intravenous, Continuous, Evagelia Knack, Joyice Faster, MD, Stopped at 11/29/17 [phone] .  0.9 %  sodium chloride infusion, , Intravenous, Continuous, Nieshia Larmon, Joyice Faster, MD, Last Rate: 75 mL/hr at 12/01/17 1514 .  acetaminophen (TYLENOL) tablet 650 mg, 650 mg, Oral, Q4H PRN, 650 mg at 12/05/17 1213 **OR** acetaminophen (TYLENOL) suppository 650 mg, 650 mg, Rectal, Q4H PRN, Judith Part, MD .  acetaminophen (TYLENOL) tablet 1,000 mg, 1,000 mg, Oral, Q6H PRN, Costella, Vincent J, PA-C, 1,000 mg at 12/06/17 0750 .  amLODipine (NORVASC) tablet 10 mg, 10 mg, Oral, QPC supper,  Judith Part, MD, 10 mg at 12/05/17 1733 .  bisacodyl (DULCOLAX) suppository 10 mg, 10 mg, Rectal, Daily PRN, Judith Part, MD, 10 mg at 12/05/17 2208 .  dexamethasone (DECADRON) 10 mg in sodium chloride 0.9 % 50 mL IVPB, 10 mg, Intravenous, Once, Lavel Rieman A, MD .  dexamethasone (DECADRON) 4 mg in sodium chloride 0.9 % 50 mL IVPB, 4 mg, Intravenous, Q8H, Ashling Roane A, MD .  dextromethorphan-guaiFENesin (MUCINEX DM) 30-600 MG per 12 hr tablet 1 tablet, 1 tablet, Oral, BID, Judith Part, MD, 1 tablet at 12/05/17 2208 .  diazepam (VALIUM) tablet 5 mg, 5 mg, Oral, Q6H PRN, Judith Part, MD .  gabapentin (NEURONTIN) capsule 300 mg, 300 mg, Oral, TID, Mirtie Bastyr A, MD .  guaiFENesin-dextromethorphan (ROBITUSSIN DM) 100-10 MG/5ML syrup 5 mL, 5 mL, Oral, Q4H PRN, Judith Part, MD, 5 mL at 12/04/17 1448 .  heparin injection 5,000 Units, 5,000 Units, Subcutaneous, Q8H, Judith Part, MD, 5,000 Units at 12/04/17 [phone] .  HYDROmorphone (DILAUDID) injection 1 mg, 1 mg, Intravenous, Q3H PRN, Costella, Vincent J, PA-C, 1 mg at 12/06/17 0631 .  lactated ringers infusion, , Intravenous, Continuous, Ramondo Dietze, Joyice Faster, MD, Last Rate: 0 mL/hr at 11/29/17 1735 .  loratadine (CLARITIN) tablet  10 mg, 10 mg, Oral, Daily, Judith Part, MD, 10 mg at 12/05/17 1056 .  menthol-cetylpyridinium (CEPACOL) lozenge 3 mg, 1 lozenge, Oral, PRN, 3 mg at 11/30/17 0613 **OR** phenol (CHLORASEPTIC) mouth spray 1 spray, 1 spray, Mouth/Throat, PRN, Alysson Geist A, MD .  metroNIDAZOLE (METROGEL) 4.96 % gel 1 application, 1 application, Topical, Daily PRN, Judith Part, MD .  multivitamin liquid 15 mL, 15 mL, Oral, Daily, Presley Gora A, MD, 15 mL at 12/05/17 1056 .  ondansetron (ZOFRAN) tablet 4 mg, 4 mg, Oral, Q6H PRN **OR** ondansetron (ZOFRAN) injection 4 mg, 4 mg, Intravenous, Q6H PRN, Judith Part, MD, 4 mg at 11/29/17 1331 .  oxyCODONE (Oxy  IR/ROXICODONE) immediate release tablet 10 mg, 10 mg, Oral, Q3H PRN, Judith Part, MD, 10 mg at 12/06/17 0412 .  oxyCODONE (Oxy IR/ROXICODONE) immediate release tablet 5 mg, 5 mg, Oral, Q3H PRN, Judith Part, MD, 5 mg at 12/06/17 0746 .  polyethylene glycol (MIRALAX / GLYCOLAX) packet 17 g, 17 g, Oral, Daily PRN, Judith Part, MD, 17 g at 12/04/17 1043 .  sodium chloride (OCEAN) 0.65 % nasal spray 1 spray, 1 spray, Each Nare, PRN, Judith Part, MD, 1 spray at 12/01/17 1241 .  sodium chloride (OCEAN) 0.65 % nasal spray 1 spray, 1 spray, Each Nare, PRN, Judith Part, MD, 1 spray at 12/03/17 0910 .  sodium chloride flush (NS) 0.9 % injection 3 mL, 3 mL, Intravenous, Q12H, Raisa Ditto, Joyice Faster, MD, 3 mL at 12/05/17 2209 .  sodium chloride flush (NS) 0.9 % injection 3 mL, 3 mL, Intravenous, PRN, Leo Weyandt A, MD .  sorbitol 70 % solution 30 mL, 30 mL, Oral, Daily PRN, Judith Part, MD, 30 mL at 12/05/17 1222 .  tamsulosin (FLOMAX) capsule 0.4 mg, 0.4 mg, Oral, Daily, Costella, Vincent J, PA-C, 0.4 mg at 12/05/17 1056 .  triamcinolone cream (KENALOG) 0.1 % 1 application, 1 application, Topical, Daily PRN, Judith Part, MD   Physical Exam: AOx3, PERRL, EOMI, FS, TM,  Strength 4-/5 proximally in BUE, 3/5 distally, 4-/5 in BLE Sensation improving Incision c/d/i, neck soft  Assessment & Plan: 83 y.o. woman s/p fall with cervical extension injury and central cord syndrome, MRI w/ ALL and disc rupture and severe stenosis from C5-C7 with cord edema, 10/29 s/p C5-6/6-7 ACDF, recovering well. Other injuries include T11 coronal VB fracture. -OOB as tolerated with TLSO brace on -PT/OT rec CIR, PM&R consulted, CIR transfer pending -still complaining of discomfort, will increase gabapentin to 300tid, robaxin to diazepam 5q6 -dysphagia is likely related to post-operative esophageal edema, no evidence of anterior cervical hematoma on exam, will give dex 10x1  now then 4q8 x2 doses and re-evaluate -SCDs/TEDs, SQH   Judith Part  12/06/17 8:44 AM

## 2017-12-07 ENCOUNTER — Inpatient Hospital Stay (HOSPITAL_COMMUNITY)
Admission: RE | Admit: 2017-12-07 | Discharge: 2018-01-19 | DRG: 939 | Disposition: A | Discharge status: Skilled Nursing Facility | Payer: Medicare HMO | Source: Intra-hospital | Attending: Physical Medicine & Rehabilitation | Admitting: Physical Medicine & Rehabilitation

## 2017-12-07 ENCOUNTER — Inpatient Hospital Stay (HOSPITAL_COMMUNITY): Payer: Medicare HMO

## 2017-12-07 ENCOUNTER — Inpatient Hospital Stay (HOSPITAL_COMMUNITY)
Admission: RE | Admit: 2017-12-07 | Payer: Medicare HMO | Source: Intra-hospital | Admitting: Physical Medicine & Rehabilitation

## 2017-12-07 ENCOUNTER — Encounter: Payer: Self-pay | Admitting: Emergency Medicine

## 2017-12-07 ENCOUNTER — Encounter (HOSPITAL_COMMUNITY): Payer: Self-pay | Admitting: *Deleted

## 2017-12-07 ENCOUNTER — Other Ambulatory Visit: Payer: Self-pay

## 2017-12-07 DIAGNOSIS — B9620 Unspecified Escherichia coli [E. coli] as the cause of diseases classified elsewhere: Secondary | ICD-10-CM | POA: Diagnosis not present

## 2017-12-07 DIAGNOSIS — Z79899 Other long term (current) drug therapy: Secondary | ICD-10-CM | POA: Diagnosis not present

## 2017-12-07 DIAGNOSIS — Z91011 Allergy to milk products: Secondary | ICD-10-CM | POA: Diagnosis not present

## 2017-12-07 DIAGNOSIS — R936 Abnormal findings on diagnostic imaging of limbs: Secondary | ICD-10-CM | POA: Diagnosis not present

## 2017-12-07 DIAGNOSIS — S22088D Other fracture of T11-T12 vertebra, subsequent encounter for fracture with routine healing: Secondary | ICD-10-CM

## 2017-12-07 DIAGNOSIS — G8250 Quadriplegia, unspecified: Secondary | ICD-10-CM

## 2017-12-07 DIAGNOSIS — S14125D Central cord syndrome at C5 level of cervical spinal cord, subsequent encounter: Secondary | ICD-10-CM | POA: Diagnosis not present

## 2017-12-07 DIAGNOSIS — M792 Neuralgia and neuritis, unspecified: Secondary | ICD-10-CM

## 2017-12-07 DIAGNOSIS — S14129S Central cord syndrome at unspecified level of cervical spinal cord, sequela: Secondary | ICD-10-CM | POA: Diagnosis not present

## 2017-12-07 DIAGNOSIS — S14121D Central cord syndrome at C1 level of cervical spinal cord, subsequent encounter: Secondary | ICD-10-CM | POA: Diagnosis present

## 2017-12-07 DIAGNOSIS — D62 Acute posthemorrhagic anemia: Secondary | ICD-10-CM | POA: Diagnosis present

## 2017-12-07 DIAGNOSIS — Z91018 Allergy to other foods: Secondary | ICD-10-CM

## 2017-12-07 DIAGNOSIS — I82441 Acute embolism and thrombosis of right tibial vein: Secondary | ICD-10-CM | POA: Diagnosis not present

## 2017-12-07 DIAGNOSIS — I10 Essential (primary) hypertension: Secondary | ICD-10-CM | POA: Diagnosis present

## 2017-12-07 DIAGNOSIS — M7989 Other specified soft tissue disorders: Secondary | ICD-10-CM | POA: Diagnosis present

## 2017-12-07 DIAGNOSIS — E871 Hypo-osmolality and hyponatremia: Secondary | ICD-10-CM | POA: Diagnosis present

## 2017-12-07 DIAGNOSIS — R609 Edema, unspecified: Secondary | ICD-10-CM | POA: Diagnosis present

## 2017-12-07 DIAGNOSIS — Z888 Allergy status to other drugs, medicaments and biological substances status: Secondary | ICD-10-CM | POA: Diagnosis not present

## 2017-12-07 DIAGNOSIS — F329 Major depressive disorder, single episode, unspecified: Secondary | ICD-10-CM | POA: Diagnosis present

## 2017-12-07 DIAGNOSIS — N319 Neuromuscular dysfunction of bladder, unspecified: Secondary | ICD-10-CM | POA: Diagnosis present

## 2017-12-07 DIAGNOSIS — O2230 Deep phlebothrombosis in pregnancy, unspecified trimester: Secondary | ICD-10-CM

## 2017-12-07 DIAGNOSIS — F419 Anxiety disorder, unspecified: Secondary | ICD-10-CM | POA: Diagnosis present

## 2017-12-07 DIAGNOSIS — Z9889 Other specified postprocedural states: Secondary | ICD-10-CM | POA: Diagnosis not present

## 2017-12-07 DIAGNOSIS — S14126A Central cord syndrome at C6 level of cervical spinal cord, initial encounter: Secondary | ICD-10-CM

## 2017-12-07 DIAGNOSIS — I82413 Acute embolism and thrombosis of femoral vein, bilateral: Secondary | ICD-10-CM | POA: Diagnosis not present

## 2017-12-07 DIAGNOSIS — I82409 Acute embolism and thrombosis of unspecified deep veins of unspecified lower extremity: Secondary | ICD-10-CM | POA: Diagnosis not present

## 2017-12-07 DIAGNOSIS — N390 Urinary tract infection, site not specified: Secondary | ICD-10-CM | POA: Diagnosis not present

## 2017-12-07 DIAGNOSIS — I825Y3 Chronic embolism and thrombosis of unspecified deep veins of proximal lower extremity, bilateral: Secondary | ICD-10-CM | POA: Diagnosis not present

## 2017-12-07 DIAGNOSIS — B961 Klebsiella pneumoniae [K. pneumoniae] as the cause of diseases classified elsewhere: Secondary | ICD-10-CM | POA: Diagnosis present

## 2017-12-07 DIAGNOSIS — W19XXXD Unspecified fall, subsequent encounter: Secondary | ICD-10-CM | POA: Diagnosis present

## 2017-12-07 DIAGNOSIS — K592 Neurogenic bowel, not elsewhere classified: Secondary | ICD-10-CM

## 2017-12-07 DIAGNOSIS — D72829 Elevated white blood cell count, unspecified: Secondary | ICD-10-CM

## 2017-12-07 DIAGNOSIS — E876 Hypokalemia: Secondary | ICD-10-CM | POA: Diagnosis present

## 2017-12-07 DIAGNOSIS — R140 Abdominal distension (gaseous): Secondary | ICD-10-CM

## 2017-12-07 DIAGNOSIS — T1490XA Injury, unspecified, initial encounter: Secondary | ICD-10-CM

## 2017-12-07 DIAGNOSIS — I82411 Acute embolism and thrombosis of right femoral vein: Secondary | ICD-10-CM | POA: Diagnosis not present

## 2017-12-07 DIAGNOSIS — E8809 Other disorders of plasma-protein metabolism, not elsewhere classified: Secondary | ICD-10-CM | POA: Diagnosis present

## 2017-12-07 DIAGNOSIS — Z981 Arthrodesis status: Secondary | ICD-10-CM

## 2017-12-07 DIAGNOSIS — S14129D Central cord syndrome at unspecified level of cervical spinal cord, subsequent encounter: Secondary | ICD-10-CM | POA: Diagnosis not present

## 2017-12-07 DIAGNOSIS — S14129A Central cord syndrome at unspecified level of cervical spinal cord, initial encounter: Secondary | ICD-10-CM | POA: Diagnosis present

## 2017-12-07 LAB — CREATININE, SERUM
Creatinine, Ser: 0.54 mg/dL (ref 0.44–1.00)
GFR calc Af Amer: >60 mL/min (ref >60)
GFR calc non Af Amer: >60 mL/min (ref >60)

## 2017-12-07 LAB — CBC
HEMATOCRIT: 35.4 % — AB (ref 36.0–46.0)
Hemoglobin: 11.2 g/dL — ABNORMAL LOW (ref 12.0–15.0)
MCH: 28.4 pg (ref 26.0–34.0)
MCHC: 31.6 g/dL (ref 30.0–36.0)
MCV: 89.6 fL (ref 80.0–100.0)
Platelets: 304 10*3/uL (ref 150–400)
RBC: 3.95 MIL/uL (ref 3.87–5.11)
RDW: 13.6 % (ref 11.5–15.5)
WBC: 8.9 10*3/uL (ref 4.0–10.5)
nRBC: 0 % (ref 0.0–0.2)

## 2017-12-07 MED ORDER — ADULT MULTIVITAMIN LIQUID CH
15.0000 mL | Freq: Every day | ORAL | Status: DC
  Administered 2017-12-08 – 2018-01-19 (×43): 15 mL via ORAL
  Filled 2017-12-07 (×44): qty 15

## 2017-12-07 MED ORDER — ONDANSETRON HCL 4 MG PO TABS: 4.0000 mg | ORAL_TABLET | Freq: Four times a day (QID) | ORAL | Status: DC | PRN

## 2017-12-07 MED ORDER — HEPARIN SODIUM (PORCINE) 5000 UNIT/ML IJ SOLN: 5000.0000 [IU] | Freq: Three times a day (TID) | INTRAMUSCULAR | Status: DC

## 2017-12-07 MED ORDER — SODIUM CHLORIDE 0.9 % IV SOLN
4.0000 mg | Freq: Three times a day (TID) | INTRAVENOUS | Status: DC
  Administered 2017-12-07: 4 mg via INTRAVENOUS
  Filled 2017-12-07 (×2): qty 0.4

## 2017-12-07 MED ORDER — ACETAMINOPHEN 325 MG PO TABS
650.0000 mg | ORAL_TABLET | ORAL | Status: DC | PRN
  Administered 2017-12-09 – 2018-01-19 (×64): 650 mg via ORAL
  Filled 2017-12-07 (×66): qty 2

## 2017-12-07 MED ORDER — DIAZEPAM 5 MG PO TABS
5.0000 mg | ORAL_TABLET | Freq: Four times a day (QID) | ORAL | Status: DC | PRN
  Administered 2017-12-07 – 2017-12-19 (×21): 5 mg via ORAL
  Filled 2017-12-07 (×21): qty 1

## 2017-12-07 MED ORDER — POLYETHYLENE GLYCOL 3350 17 G PO PACK
17.0000 g | PACK | Freq: Every day | ORAL | Status: DC | PRN
  Administered 2018-01-15: 17 g via ORAL
  Filled 2017-12-07: qty 1

## 2017-12-07 MED ORDER — INFLUENZA VAC SPLIT HIGH-DOSE 0.5 ML IM SUSY: 0.5000 mL | PREFILLED_SYRINGE | INTRAMUSCULAR | Status: DC | PRN

## 2017-12-07 MED ORDER — LORATADINE 10 MG PO TABS
10.0000 mg | ORAL_TABLET | Freq: Every day | ORAL | Status: DC
  Administered 2017-12-08 – 2018-01-19 (×43): 10 mg via ORAL
  Filled 2017-12-07 (×44): qty 1

## 2017-12-07 MED ORDER — ACETAMINOPHEN 650 MG RE SUPP: 650.0000 mg | RECTAL | Status: DC | PRN

## 2017-12-07 MED ORDER — AMLODIPINE BESYLATE 10 MG PO TABS
10.0000 mg | ORAL_TABLET | Freq: Every day | ORAL | Status: DC
  Administered 2017-12-09 – 2018-01-18 (×41): 10 mg via ORAL
  Filled 2017-12-07 (×42): qty 1

## 2017-12-07 MED ORDER — METRONIDAZOLE 0.75 % EX GEL: 1.0000 "application " | Freq: Every day | CUTANEOUS | Status: DC | PRN

## 2017-12-07 MED ORDER — HEPARIN SODIUM (PORCINE) 5000 UNIT/ML IJ SOLN
5000.0000 [IU] | Freq: Three times a day (TID) | INTRAMUSCULAR | Status: DC
  Administered 2017-12-07: 5000 [IU] via SUBCUTANEOUS
  Filled 2017-12-07 (×2): qty 1

## 2017-12-07 MED ORDER — OXYCODONE HCL 5 MG PO TABS
5.0000 mg | ORAL_TABLET | ORAL | Status: DC | PRN
  Administered 2017-12-07 – 2018-01-19 (×194): 5 mg via ORAL
  Filled 2017-12-07 (×199): qty 1

## 2017-12-07 MED ORDER — TRIAMCINOLONE ACETONIDE 0.1 % EX CREA: 1.0000 "application " | TOPICAL_CREAM | Freq: Every day | CUTANEOUS | Status: DC | PRN

## 2017-12-07 MED ORDER — GABAPENTIN 300 MG PO CAPS
300.0000 mg | ORAL_CAPSULE | Freq: Three times a day (TID) | ORAL | Status: DC
  Administered 2017-12-07 – 2017-12-08 (×2): 300 mg via ORAL
  Filled 2017-12-07 (×2): qty 1

## 2017-12-07 MED ORDER — DM-GUAIFENESIN ER 30-600 MG PO TB12
1.0000 | ORAL_TABLET | Freq: Two times a day (BID) | ORAL | Status: DC
  Administered 2017-12-07 – 2017-12-10 (×6): 1 via ORAL
  Filled 2017-12-07 (×7): qty 1

## 2017-12-07 MED ORDER — BISACODYL 10 MG RE SUPP
10.0000 mg | Freq: Every day | RECTAL | Status: DC | PRN
  Administered 2017-12-08: 10 mg via RECTAL
  Filled 2017-12-07: qty 1

## 2017-12-07 MED ORDER — SALINE SPRAY 0.65 % NA SOLN
1.0000 | NASAL | Status: DC | PRN
  Administered 2017-12-14 – 2018-01-01 (×7): 1 via NASAL
  Filled 2017-12-07 (×3): qty 44

## 2017-12-07 MED ORDER — ONDANSETRON HCL 4 MG/2ML IJ SOLN: 4.0000 mg | Freq: Four times a day (QID) | INTRAMUSCULAR | Status: DC | PRN

## 2017-12-07 MED ORDER — ORAL CARE MOUTH RINSE
15.0000 mL | Freq: Two times a day (BID) | OROMUCOSAL | Status: DC
  Administered 2017-12-08 – 2018-01-18 (×74): 15 mL via OROMUCOSAL

## 2017-12-07 MED ORDER — SORBITOL 70 % SOLN
30.0000 mL | Freq: Every day | Status: DC | PRN
  Administered 2018-01-15: 30 mL via ORAL
  Filled 2017-12-07: qty 30

## 2017-12-07 MED ORDER — TAMSULOSIN HCL 0.4 MG PO CAPS
0.4000 mg | ORAL_CAPSULE | Freq: Every day | ORAL | Status: DC
  Administered 2017-12-08 – 2018-01-19 (×43): 0.4 mg via ORAL
  Filled 2017-12-07 (×44): qty 1

## 2017-12-07 NOTE — Progress Notes (Signed)
Admit to unit via w/c, oriented to unit, routine, meds, plan of care. Dtr and pt state an understanding of information provided. Margarito Liner  '

## 2017-12-07 NOTE — Progress Notes (Signed)
IP rehab admissions - I sent updates to [facility] yesterday as requested.  I await a call back from insurance carrier regarding possible inpatient rehab admission.  Call me for questions.  [phone]

## 2017-12-07 NOTE — Plan of Care (Signed)
Pt progressing in all areas of care, pt transferring to inpatient rehab today to continue care and therapy.

## 2017-12-07 NOTE — Progress Notes (Signed)
Neurosurgery Service Progress Note  Subjective: No acute events overnight, pain better controlled, swallowing stable  Objective: Vitals:   12/06/17 2000 12/06/17 2355 12/07/17 0331 12/07/17 0749  BP: 127/69 116/64 (!) 110/59 111/65  Pulse: 85 82 84 80  Resp: 18 18 18 18   Temp: 98 F (36.7 C) 98.4 F (36.9 C) 99.3 F (37.4 C) 98.3 F (36.8 C)  TempSrc: Oral Oral Oral Oral  SpO2: 94% 94% 94% 93%  Weight:      Height:       Temp (24hrs), Avg:98.4 F (36.9 C), Min:97.9 F (36.6 C), Max:99.3 F (37.4 C)  CBC Latest Ref Rng & Units 11/28/2017 11/28/2017  WBC 4.0 - 10.5 K/uL 8.9 -  Hemoglobin 12.0 - 15.0 g/dL 12.0 12.6  Hematocrit 36.0 - 46.0 % 38.5 37.0  Platelets 150 - 400 K/uL 232 -   BMP Latest Ref Rng & Units 11/28/2017 11/28/2017  Glucose 70 - 99 mg/dL 138(H) 134(H)  BUN 8 - 23 mg/dL 21 22  Creatinine 0.44 - 1.00 mg/dL 0.88 0.90  Sodium 135 - 145 mmol/L 138 140  Potassium 3.5 - 5.1 mmol/L 3.8 3.8  Chloride 98 - 111 mmol/L 108 104  CO2 22 - 32 mmol/L 24 -  Calcium 8.9 - 10.3 mg/dL 8.7(L) -    Intake/Output Summary (Last 24 hours) at 12/07/2017 0851 Last data filed at 12/07/2017 0331 Gross per 24 hour  Intake 19.85 ml  Output 1825 ml  Net -1805.15 ml    Current Facility-Administered Medications:  .  0.9 %  sodium chloride infusion, 250 mL, Intravenous, Continuous, Keyira Mondesir, Joyice Faster, MD, Stopped at 11/29/17 [phone] .  0.9 %  sodium chloride infusion, , Intravenous, Continuous, Nishanth Mccaughan, Joyice Faster, MD, Last Rate: 75 mL/hr at 12/01/17 1514 .  acetaminophen (TYLENOL) tablet 650 mg, 650 mg, Oral, Q4H PRN, 650 mg at 12/05/17 1213 **OR** acetaminophen (TYLENOL) suppository 650 mg, 650 mg, Rectal, Q4H PRN, Judith Part, MD .  acetaminophen (TYLENOL) tablet 1,000 mg, 1,000 mg, Oral, Q6H PRN, Costella, Vincent J, PA-C, 1,000 mg at 12/06/17 0750 .  amLODipine (NORVASC) tablet 10 mg, 10 mg, Oral, QPC supper, Judith Part, MD, 10 mg at 12/06/17 1842 .  bisacodyl  (DULCOLAX) suppository 10 mg, 10 mg, Rectal, Daily PRN, Judith Part, MD, 10 mg at 12/05/17 2208 .  dexamethasone (DECADRON) 4 mg in sodium chloride 0.9 % 50 mL IVPB, 4 mg, Intravenous, Q8H, Dario Yono A, MD .  dextromethorphan-guaiFENesin ([place] DM) 30-600 MG per 12 hr tablet 1 tablet, 1 tablet, Oral, BID, Judith Part, MD, 1 tablet at 12/06/17 2213 .  diazepam (VALIUM) tablet 5 mg, 5 mg, Oral, Q6H PRN, Judith Part, MD, 5 mg at 12/06/17 2226 .  gabapentin (NEURONTIN) capsule 300 mg, 300 mg, Oral, TID, Aleck Locklin A, MD, 300 mg at 12/06/17 2213 .  guaiFENesin-dextromethorphan (ROBITUSSIN DM) 100-10 MG/5ML syrup 5 mL, 5 mL, Oral, Q4H PRN, Judith Part, MD, 5 mL at 12/06/17 1010 .  heparin injection 5,000 Units, 5,000 Units, Subcutaneous, Q8H, Judith Part, MD, 5,000 Units at 12/04/17 [phone] .  HYDROmorphone (DILAUDID) injection 1 mg, 1 mg, Intravenous, Q3H PRN, Costella, Vincent J, PA-C, 1 mg at 12/07/17 0700 .  lactated ringers infusion, , Intravenous, Continuous, Jabier Deese, Joyice Faster, MD, Last Rate: 0 mL/hr at 11/29/17 1735 .  loratadine (CLARITIN) tablet 10 mg, 10 mg, Oral, Daily, Judith Part, MD, 10 mg at 12/06/17 0939 .  menthol-cetylpyridinium (CEPACOL) lozenge 3 mg, 1 lozenge, Oral,  PRN, 3 mg at 11/30/17 0613 **OR** phenol (CHLORASEPTIC) mouth spray 1 spray, 1 spray, Mouth/Throat, PRN, Sophiarose Eades A, MD .  metroNIDAZOLE (METROGEL) 9.45 % gel 1 application, 1 application, Topical, Daily PRN, Judith Part, MD .  multivitamin liquid 15 mL, 15 mL, Oral, Daily, Abisai Deer A, MD, 15 mL at 12/06/17 1026 .  ondansetron (ZOFRAN) tablet 4 mg, 4 mg, Oral, Q6H PRN **OR** ondansetron (ZOFRAN) injection 4 mg, 4 mg, Intravenous, Q6H PRN, Judith Part, MD, 4 mg at 11/29/17 1331 .  oxyCODONE (Oxy IR/ROXICODONE) immediate release tablet 10 mg, 10 mg, Oral, Q3H PRN, Judith Part, MD, 10 mg at 12/07/17 0515 .  oxyCODONE (Oxy  IR/ROXICODONE) immediate release tablet 5 mg, 5 mg, Oral, Q3H PRN, Judith Part, MD, 5 mg at 12/06/17 2226 .  polyethylene glycol (MIRALAX / GLYCOLAX) packet 17 g, 17 g, Oral, Daily PRN, Judith Part, MD, 17 g at 12/06/17 1009 .  sodium chloride (OCEAN) 0.65 % nasal spray 1 spray, 1 spray, Each Nare, PRN, Judith Part, MD, 1 spray at 12/01/17 1241 .  sodium chloride (OCEAN) 0.65 % nasal spray 1 spray, 1 spray, Each Nare, PRN, Judith Part, MD, 1 spray at 12/03/17 0910 .  sodium chloride flush (NS) 0.9 % injection 3 mL, 3 mL, Intravenous, Q12H, Denay Pleitez, Joyice Faster, MD, 3 mL at 12/06/17 2237 .  sodium chloride flush (NS) 0.9 % injection 3 mL, 3 mL, Intravenous, PRN, Annia Gomm A, MD .  sorbitol 70 % solution 30 mL, 30 mL, Oral, Daily PRN, Judith Part, MD, 30 mL at 12/06/17 1013 .  tamsulosin (FLOMAX) capsule 0.4 mg, 0.4 mg, Oral, Daily, Costella, Vincent J, PA-C, 0.4 mg at 12/06/17 0939 .  triamcinolone cream (KENALOG) 0.1 % 1 application, 1 application, Topical, Daily PRN, Judith Part, MD   Physical Exam: AOx3, PERRL, EOMI, FS, TM,  Strength 4-/5 proximally in BUE, 3/5 distally, 4-/5 in BLE Sensation improving Incision c/d/i, neck soft  Assessment & Plan: 83 y.o. woman s/p fall with cervical extension injury and central cord syndrome, MRI w/ ALL and disc rupture and severe stenosis from C5-C7 with cord edema, 10/29 s/p C5-6/6-7 ACDF, recovering well. Other injuries include T11 coronal VB fracture. -OOB as tolerated with TLSO brace on -PT/OT rec CIR, PM&R consulted, CIR transfer pending -pain better controlled, continue current regimen -dysphagia stable, tolerating softs, SLP rec'd MBS, pending today -cont steroids for dysphagia x1 more day  -SCDs/TEDs, SQH   Judith Part  12/07/17 8:51 AM

## 2017-12-07 NOTE — Progress Notes (Signed)
IP rehab admissions - I have received a denial from [facility] for acute inpatient rehab admission.  I have contacted Dr. Zada Finders to see if attending MD would like to do a peer to peer appeal with Dr. Mariea Clonts at [phone].  I will follow up after peer to peer is completed.  Call me for questions.  [phone]

## 2017-12-07 NOTE — H&P (Addendum)
Physical Medicine and Rehabilitation Admission H&P    Chief Complaint  Patient presents with  . Fall  : HPI: Sherry [name] is an 83 year old right-handed female with history of hypertension.  Per chart review, family, and patient, lives with spouse.  Independent prior to admission.  Multilevel home with bed and bath on main level.  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  She has multiple family in the area.  Presented 11/29/2017 after a fall with immediate quadriparesis.  Cranial CT scan reviewed, unremarkable for acute intracranial process. CT/MRI imaging showed C6-7 disc fracture and tear of anterior longitudinal ligament.  Paraspinal muscle and interspinous edema from C1-C7 compatible ligament injury and muscle strain.  Increased cord signal at C5 and C6.  No cord hemorrhage and also noted T11 coronal vertebral body fracture.  Underwent C5-6, C6-7 anterior cervical discectomy and fusion 11/29/2017 per Dr. Venetia Constable.  TLSO back brace when out of bed applied in sitting position.  Hospital course pain management.  Subcutaneous heparin for DVT prophylaxis.  Therapy evaluations completed with recommendations of physical medicine rehab consult.  Patient was admitted for a comprehensive rehab program.  Review of Systems  Constitutional: Negative for chills and fever.  HENT: Negative for hearing loss.   Eyes: Negative for blurred vision and double vision.  Respiratory: Negative for cough and shortness of breath.   Cardiovascular: Positive for leg swelling. Negative for chest pain.  Gastrointestinal: Positive for constipation. Negative for nausea and vomiting.  Genitourinary: Negative for dysuria, flank pain and hematuria.  Musculoskeletal: Positive for myalgias.  Skin: Negative for rash.  Neurological: Positive for tingling, sensory change, focal weakness and weakness.  All other systems reviewed and are negative.  Past Medical History:  Diagnosis Date  .  Arthritis   . Hypertension    Past Surgical History:  Procedure Laterality Date  . ANTERIOR CERVICAL DECOMP/DISCECTOMY FUSION N/A 11/29/2017   Procedure: Cervical five-six, six-seven Anterior Cervical Decompression Fusion;  Surgeon: Judith Part, MD;  Location: [facility];  Service: Neurosurgery;  Laterality: N/A;  Cervical five-six, six-seven Anterior Cervical Decompression Fusion   No pertinent family history of cervical trauma. Social History:  reports that she has never smoked. She has never used smokeless tobacco. She reports that she does not drink alcohol or use drugs. Allergies:  Allergies  Allergen Reactions  . Beef-Derived Products Swelling    Facial and lip swelling (due to tick bite)  . Dairy Aid [Lactase] Swelling  . Lisinopril-Hydrochlorothiazide Swelling    Tongue swelling  . Pork-Derived Products Swelling   Medications Prior to Admission  Medication Sig Dispense Refill  . amLODipine (NORVASC) 10 MG tablet Take 10 mg by mouth daily after supper.    . Ascorbic Acid (VITAMIN C PO) Take 1 tablet by mouth daily.    Marland Kitchen b complex vitamins tablet Take 1 tablet by mouth daily.    . Cholecalciferol (VITAMIN D PO) Take 1 tablet by mouth daily.     . metronidazole (NORITATE) 1 % cream Apply 1 application topically daily as needed (rosacea).    . Multiple Vitamin (MULTIVITAMIN WITH MINERALS) TABS tablet Take 1 tablet by mouth daily.    . naproxen sodium (ALEVE) 220 MG tablet Take 220 mg by mouth 2 (two) times daily as needed (pain/headache).    . triamcinolone cream (KENALOG) 0.1 % Apply 1 application topically daily as needed (eczema).      Drug Regimen Review Drug regimen was reviewed and remains appropriate with no  significant issues identified  Home: Home Living Family/patient expects to be discharged to:: Private residence Living Arrangements: Spouse/significant other Available Help at Discharge: Family, Available PRN/intermittently Type of Home: House Home Access:  Stairs to enter Technical brewer of Steps: 3 Entrance Stairs-Rails: Can reach both Home Layout: Multi-level, Laundry or work area in basement, Able to live on main level with bedroom/bathroom Alternate Level Stairs-Number of Steps: flight, does not need to go to basement as family can assist  Bathroom Shower/Tub: Chiropodist: Standard Home Equipment: None Additional Comments: family reports they also own a separate home that is level and has ramped enterance that she could stay at    Functional History: Prior Function Level of Independence: Independent Comments: caregiver for husband, who has memory problems,  and 2 other family members, very independent at baseline   Functional Status:  Mobility: Bed Mobility Overal bed mobility: Needs Assistance Bed Mobility: Sit to Sidelying, Rolling Rolling: Total assist Sidelying to sit: Total assist, +2 for physical assistance, +2 for safety/equipment Supine to sit: +2 for physical assistance, Max assist, +2 for safety/equipment Sit to sidelying: Total assist, +2 for physical assistance, +2 for safety/equipment General bed mobility comments: cues for technique and supportive and lifting assist Transfers Overall transfer level: Needs assistance Transfers: Lateral/Scoot Transfers  Lateral/Scoot Transfers: Total assist, +2 physical assistance General transfer comment: cues and hand over hand assist of UE's and truncal assist Ambulation/Gait General Gait Details: DNT, fatigue     ADL: ADL Overall ADL's : Needs assistance/impaired Eating/Feeding: Total assistance, Bed level Grooming: Wash/dry hands, Wash/dry face, Oral care, Brushing hair, Total assistance, Bed level Upper Body Bathing: Total assistance, Bed level Lower Body Bathing: Total assistance, Bed level Upper Body Dressing : Total assistance, Bed level Lower Body Dressing: Total assistance, Bed level Toilet Transfer: Total assistance Toilet Transfer  Details (indicate cue type and reason): unable  Toileting- Clothing Manipulation and Hygiene: Total assistance, Bed level Functional mobility during ADLs: Total assistance, +2 for physical assistance General ADL Comments: requires total A for all aspects   Cognition: Cognition Overall Cognitive Status: Within Functional Limits for tasks assessed Orientation Level: Oriented X4 Cognition Arousal/Alertness: Awake/alert Behavior During Therapy: WFL for tasks assessed/performed Overall Cognitive Status: Within Functional Limits for tasks assessed General Comments: Pt  fell asleep multiple times during exercise.  Distracted by pain today.  Very difficult to get comfortable   Physical Exam: Blood pressure (!) 110/59, pulse 84, temperature 99.3 F (37.4 C), temperature source Oral, resp. rate 18, height 5\' 7"  (1.702 m), weight 78.9 kg, SpO2 94 %. Physical Exam  Vitals reviewed. Constitutional: She appears well-developed and well-nourished.  HENT:  Head: Normocephalic and atraumatic.  Eyes: EOM are normal. Right eye exhibits no discharge. Left eye exhibits no discharge.  Neck: Normal range of motion. Neck supple. No thyromegaly present.  Cardiovascular: Normal rate and regular rhythm.  Respiratory: Effort normal and breath sounds normal. No respiratory distress.  GI: Soft. Bowel sounds are normal. She exhibits no distension.  Musculoskeletal:  Dependent edema  Neurological: She is alert.  Alert.  She is oriented to person, place and time. Motor: RUE: Shoulder abduction, elbow flex 3+/5, elbow extension, wrist extension 2-/5, hand gip 1/5 LUE: Shoulder abduction, elbow flexion 3-/5, distal myotomes 1/5 B/l LE: 1+/5 proximal to distal Sensation diminished to light touch b/l LE  Skin:  Incision c/d/i  Psychiatric: She has a normal mood and affect. Her behavior is normal.    Medical Problem List and Plan: 1.  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  Status post C5-6 and C6-7 anterior cervical discectomy and fusion 11/29/2017.  TLSO back brace when out of bed 2.  DVT Prophylaxis/Anticoagulation: Subcutaneous heparin.  Check vascular study 3. Pain Management: Neurontin 300 mg 3 times daily, Valium 5 mg every 6 hours as needed muscle spasms, oxycodone as needed pain 4. Mood: Provide emotional support 5. Neuropsych: This patient is capable of making decisions on her own behalf. 6. Skin/Wound Care: Routine skin checks 7. Fluids/Electrolytes/Nutrition: Routine in and outs with follow-up chemistries 8.  Neurogenic bowel and bladder.  Flomax 0.4 mg daily.  Check PVR x3.  Established bowel program 9.  Hypertension.  Norvasc 10 mg daily.  Monitor when out of bed 10. Acute blood loss anemia: Follow up CBC in the AM   Post Admission Physician Evaluation: 1. Preadmission assessment reviewed and changes made below. 2. Functional deficits secondary  to cervical spinal cord myelopathy/central cord syndrome. 3. Patient is admitted to receive collaborative, interdisciplinary care between the physiatrist, rehab nursing staff, and therapy team. 4. Patient's level of medical complexity and substantial therapy needs in context of that medical necessity cannot be provided at a lesser intensity of care such as a SNF. 5. Patient has experienced substantial functional loss from his/her baseline which was documented above under the "Functional History" and "Functional Status" headings.  Judging by the patient's diagnosis, physical exam, and functional history, the patient has potential for functional progress which will result in measurable gains while on inpatient rehab.  These gains will be of substantial and practical use upon discharge  in facilitating mobility and self-care at the household level. 6. Physiatrist will provide 24 hour management of medical needs as well as oversight of the therapy plan/treatment and provide guidance as appropriate  regarding the interaction of the two. 7. 24 hour rehab nursing will assist with bladder management, bowel management, safety, skin/wound care, disease management, medication administration, pain management and patient education  and help integrate therapy concepts, techniques,education, etc. 8. PT will assess and treat for/with: Lower extremity strength, range of motion, stamina, balance, functional mobility, safety, adaptive techniques and equipment, wound care, coping skills, pain control, education. Goals are: Max/Total A. 9. OT will assess and treat for/with: ADL's, functional mobility, safety, upper extremity strength, adaptive techniques and equipment, wound mgt, ego support, and community reintegration.   Goals are: Max/Total A. Therapy may not proceed with showering this patient. 10. Case Management and Social Worker will assess and treat for psychological issues and discharge planning. 11. Team conference will be held weekly to assess progress toward goals and to determine barriers to discharge. 12. Patient will receive at least 3 hours of therapy per day at least 5 days per week. 13. ELOS: 17-22 days.       14. Prognosis:  good and fair  I have personally performed a face to face diagnostic evaluation, including, but not limited to relevant history and physical exam findings, of this patient and developed relevant assessment and plan.  Additionally, I have reviewed and concur with the physician assistant's documentation above.  The patient's status has not changed. The original post admission physician evaluation remains appropriate, and any changes from the pre-admission screening or documentation from the acute chart are noted above.   Delice Lesch, MD, ABPMR Lavon Paganini Angiulli, PA-C 12/07/2017

## 2017-12-07 NOTE — Care Management Note (Signed)
Case Management Note  Patient Details  Name: Sherry [name] MRN: [id number] Date of Birth: [birth year]  Subjective/Objective:                    Action/Plan: Pt discharging to CIR today. CM signing off.   Expected Discharge Date:  12/07/17               Expected Discharge Plan:  [place]  In-House Referral:     Discharge planning Services  CM Consult  Post Acute Care Choice:    Choice offered to:     DME Arranged:    DME Agency:     HH Arranged:    HH Agency:     Status of Service:  Completed, signed off  If discussed at [employer], dates discussed:    Additional Comments:  Pollie Friar, RN 12/07/2017, 12:04 PM

## 2017-12-07 NOTE — Progress Notes (Signed)
Report given to RN Hilda Blades on unit 4 [place], CIR. Pt will be transferring to room 4W13 via wheelchair (tilt in space wheelchair provided for pt by therapy--CIR unit, and will go with her to rehab/CIR for continued use by pt).

## 2017-12-07 NOTE — Progress Notes (Signed)
Modified Barium Swallow Progress Note  Patient Details  Name: Sherry [name] MRN: [id number] Date of Birth: 12/07/1934  Today's Date: 12/07/2017  Modified Barium Swallow completed.  Full report located under Chart Review in the Imaging Section.  Brief recommendations include the following:  Clinical Impression  Pt demonstrates mild impairments that impede pts comfort with PO but do not pose a significant barrier to intake or risk of aspiration. Pt is able to masticate adequately but typically swallows in a piecemeal fashion with solids. Mild vallecular residue noted which pt appears to sense; this could be due to mild weakness on base of tongue or dry mucosa. A chin tuck is beneficial to open valleculae for better clearance with solids. slight delay in swallow initiation noted with thin; pt appears to swallow a bit a air with liquids.  No impact from cervical spine hardware. Overall pt is safe to continue thin liquids and solids though a chin tuck would assist in oropharyngeal clearance. Esophageal sweep also showed persistant mid esophageal stasis with pill and solids, no radiologist present to confirm. WIll f/u to assist in problem solving pts comfort with meals.    Swallow Evaluation Recommendations       SLP Diet Recommendations: Regular solids;Thin liquid   Liquid Administration via: Cup;Straw   Medication Administration: Crushed with puree       Compensations: Slow rate;Small sips/bites   Postural Changes: Remain semi-upright after after feeds/meals (Comment)           Herbie Baltimore, MA CCC-SLP  Acute Rehabilitation Services Pager [phone] Office [phone]  Lynann Beaver 12/07/2017,2:08 PM

## 2017-12-07 NOTE — Progress Notes (Signed)
Physical Therapy Treatment Patient Details Name: Sherry [name] MRN: [id number] DOB: 08-Oct-1934 Today's Date: 12/07/2017    History of Present Illness Pt is an 83 y.o. female admitted 11/28/17 after falling and sustaining immediate quadriplegia; pt does not recall events. CT shows acute T11 fx. Cervical MRI showed C6-7 disc fx, tear of anterior longitudinal ligament, paraspinal muscle and interspinous edema from C1-7 compatible with ligament injury and muscle strain; likely cord contusion C5-6.  Head imaging negative for acute injury/changes. S/p ACDF C5-C7 on 11/29/17. Per MD assessment, symptoms consistent with possible central cord syndrome. PMH includes OA, HTN.    PT Comments    Emphasis on donning brace for testing, but with stress on education,  ROM, normal movement.  Follow Up Recommendations  CIR;Supervision for mobility/OOB     Equipment Recommendations  Other (comment)    Recommendations for Other Services Rehab consult     Precautions / Restrictions Precautions Precautions: Fall;Cervical;Back;Other (comment) Precaution Comments: back precautions due to thoracic fx s/p ACDF; needs TLSO for all OOB activity but per order set does not need cervical collar  Required Braces or Orthoses: Spinal Brace Spinal Brace: Thoracolumbosacral orthotic;Applied in sitting position(today applied in supine for testing)    Mobility  Bed Mobility   Bed Mobility: Rolling Rolling: Total assist         General bed mobility comments: pt practicing roll in a normal movement pattern many times to donn TLSO for MBS testing  Transfers Overall transfer level: Needs assistance                  Ambulation/Gait                 Stairs             Wheelchair Mobility    Modified Rankin (Stroke Patients Only)       Balance                                            Cognition Arousal/Alertness: Awake/alert Behavior During Therapy: WFL for  tasks assessed/performed Overall Cognitive Status: Within Functional Limits for tasks assessed                                        Exercises Other Exercises Other Exercises: educated pt/daughter on upper and LE AAROM/PROM exercise/activity and completed the whole ROM    General Comments General comments (skin integrity, edema, etc.): pt's sats drops to around 90% on RA, oxygen reapplied/      Pertinent Vitals/Pain Pain Assessment: Faces Faces Pain Scale: Hurts little more Pain Location: left shd blade/arm and back Pain Descriptors / Indicators: Discomfort;Grimacing Pain Intervention(s): Monitored during session;Premedicated before session    Home Living                      Prior Function            PT Goals (current goals can now be found in the care plan section) Acute Rehab PT Goals PT Goal Formulation: With patient/family Time For Goal Achievement: 12/15/17 Potential to Achieve Goals: Good Progress towards PT goals: Progressing toward goals    Frequency    Min 3X/week      PT Plan Current plan remains appropriate    Co-evaluation  AM-PAC PT "6 Clicks" Daily Activity  Outcome Measure  Difficulty turning over in bed (including adjusting bedclothes, sheets and blankets)?: Unable Difficulty moving from lying on back to sitting on the side of the bed? : Unable Difficulty sitting down on and standing up from a chair with arms (e.g., wheelchair, bedside commode, etc,.)?: Unable Help needed moving to and from a bed to chair (including a wheelchair)?: Total Help needed walking in hospital room?: Total Help needed climbing 3-5 steps with a railing? : Total 6 Click Score: 6    End of Session Equipment Utilized During Treatment: Oxygen   Patient left: in bed;with call bell/phone within reach;with bed alarm set;with family/visitor present Nurse Communication: Mobility status;Need for lift equipment PT Visit Diagnosis:  Other abnormalities of gait and mobility (R26.89);Muscle weakness (generalized) (M62.81);Other symptoms and signs involving the nervous system (R29.898);Pain Pain - part of body: (back and left UE)     Time: 4166-0630 PT Time Calculation (min) (ACUTE ONLY): 42 min  Charges:  $Therapeutic Exercise: 8-22 mins $Therapeutic Activity: 23-37 mins                     12/07/2017  Sherry [name], PT Acute Rehabilitation Services [phone]  (pager) [phone]  (office)   Tessie Fass Griffin Gerrard 12/07/2017, 10:50 AM

## 2017-12-08 ENCOUNTER — Inpatient Hospital Stay (HOSPITAL_COMMUNITY): Payer: Medicare HMO | Admitting: Physical Therapy

## 2017-12-08 ENCOUNTER — Inpatient Hospital Stay (HOSPITAL_COMMUNITY): Payer: Medicare HMO

## 2017-12-08 ENCOUNTER — Ambulatory Visit (HOSPITAL_COMMUNITY): Payer: Medicare HMO | Attending: Physician Assistant

## 2017-12-08 ENCOUNTER — Encounter (HOSPITAL_COMMUNITY): Payer: Self-pay | Admitting: Radiology

## 2017-12-08 ENCOUNTER — Inpatient Hospital Stay (HOSPITAL_COMMUNITY): Payer: Medicare HMO | Admitting: Occupational Therapy

## 2017-12-08 DIAGNOSIS — S14129S Central cord syndrome at unspecified level of cervical spinal cord, sequela: Secondary | ICD-10-CM

## 2017-12-08 DIAGNOSIS — N319 Neuromuscular dysfunction of bladder, unspecified: Secondary | ICD-10-CM

## 2017-12-08 DIAGNOSIS — R936 Abnormal findings on diagnostic imaging of limbs: Secondary | ICD-10-CM | POA: Insufficient documentation

## 2017-12-08 DIAGNOSIS — K592 Neurogenic bowel, not elsewhere classified: Secondary | ICD-10-CM

## 2017-12-08 DIAGNOSIS — M7989 Other specified soft tissue disorders: Secondary | ICD-10-CM | POA: Diagnosis not present

## 2017-12-08 DIAGNOSIS — Z9889 Other specified postprocedural states: Secondary | ICD-10-CM | POA: Diagnosis not present

## 2017-12-08 HISTORY — PX: IR IVC FILTER PLMT / S&I /IMG GUID/MOD SED: IMG701

## 2017-12-08 LAB — CBC WITH DIFFERENTIAL/PLATELET
Abs Immature Granulocytes: 0.12 10*3/uL — ABNORMAL HIGH (ref 0.00–0.07)
BASOS ABS: 0 10*3/uL (ref 0.0–0.1)
Basophils Relative: 0 %
EOS PCT: 0 %
Eosinophils Absolute: 0 10*3/uL (ref 0.0–0.5)
HCT: 32.8 % — ABNORMAL LOW (ref 36.0–46.0)
Hemoglobin: 10.6 g/dL — ABNORMAL LOW (ref 12.0–15.0)
IMMATURE GRANULOCYTES: 1 %
LYMPHS PCT: 16 %
Lymphs Abs: 1.4 10*3/uL (ref 0.7–4.0)
MCH: 28.8 pg (ref 26.0–34.0)
MCHC: 32.3 g/dL (ref 30.0–36.0)
MCV: 89.1 fL (ref 80.0–100.0)
Monocytes Absolute: 0.7 10*3/uL (ref 0.1–1.0)
Monocytes Relative: 8 %
NEUTROS ABS: 6.3 10*3/uL (ref 1.7–7.7)
NEUTROS PCT: 75 %
NRBC: 0 % (ref 0.0–0.2)
Platelets: 277 10*3/uL (ref 150–400)
RBC: 3.68 MIL/uL — AB (ref 3.87–5.11)
RDW: 13.7 % (ref 11.5–15.5)
WBC: 8.6 10*3/uL (ref 4.0–10.5)

## 2017-12-08 LAB — COMPREHENSIVE METABOLIC PANEL
ALBUMIN: 2.5 g/dL — AB (ref 3.5–5.0)
ALT: 116 U/L — ABNORMAL HIGH (ref 0–44)
ANION GAP: 9 (ref 5–15)
AST: 112 U/L — ABNORMAL HIGH (ref 15–41)
Alkaline Phosphatase: 140 U/L — ABNORMAL HIGH (ref 38–126)
BUN: 18 mg/dL (ref 8–23)
CO2: 28 mmol/L (ref 22–32)
Calcium: 8.3 mg/dL — ABNORMAL LOW (ref 8.9–10.3)
Chloride: 102 mmol/L (ref 98–111)
Creatinine, Ser: 0.51 mg/dL (ref 0.44–1.00)
GFR calc Af Amer: >60 mL/min (ref >60)
GFR calc non Af Amer: >60 mL/min (ref >60)
GLUCOSE: 111 mg/dL — AB (ref 70–99)
POTASSIUM: 3.6 mmol/L (ref 3.5–5.1)
SODIUM: 139 mmol/L (ref 135–145)
Total Bilirubin: 0.8 mg/dL (ref 0.3–1.2)
Total Protein: 5 g/dL — ABNORMAL LOW (ref 6.5–8.1)

## 2017-12-08 MED ORDER — FENTANYL CITRATE (PF) 100 MCG/2ML IJ SOLN
INTRAMUSCULAR | Status: AC | PRN
  Administered 2017-12-08: 25 ug via INTRAVENOUS

## 2017-12-08 MED ORDER — LIDOCAINE HCL 1 % IJ SOLN
INTRAMUSCULAR | Status: AC
  Filled 2017-12-08: qty 20

## 2017-12-08 MED ORDER — FENTANYL CITRATE (PF) 100 MCG/2ML IJ SOLN
INTRAMUSCULAR | Status: AC
  Filled 2017-12-08: qty 2

## 2017-12-08 MED ORDER — GABAPENTIN 400 MG PO CAPS
400.0000 mg | ORAL_CAPSULE | Freq: Three times a day (TID) | ORAL | Status: DC
  Administered 2017-12-08 – 2017-12-10 (×6): 400 mg via ORAL
  Filled 2017-12-08 (×6): qty 1

## 2017-12-08 MED ORDER — LIDOCAINE HCL (PF) 1 % IJ SOLN
INTRAMUSCULAR | Status: AC | PRN
  Administered 2017-12-08: 5 mL

## 2017-12-08 MED ORDER — SENNOSIDES-DOCUSATE SODIUM 8.6-50 MG PO TABS
2.0000 | ORAL_TABLET | Freq: Every day | ORAL | Status: DC
  Administered 2017-12-08 – 2017-12-15 (×8): 2 via ORAL
  Filled 2017-12-08 (×8): qty 2

## 2017-12-08 MED ORDER — PRO-STAT SUGAR FREE PO LIQD
30.0000 mL | Freq: Two times a day (BID) | ORAL | Status: DC
  Administered 2017-12-08 – 2018-01-19 (×84): 30 mL via ORAL
  Filled 2017-12-08 (×84): qty 30

## 2017-12-08 MED ORDER — MIDAZOLAM HCL 2 MG/2ML IJ SOLN
INTRAMUSCULAR | Status: AC
  Filled 2017-12-08: qty 2

## 2017-12-08 MED ORDER — IOPAMIDOL (ISOVUE-300) INJECTION 61%
INTRAVENOUS | Status: AC
  Administered 2017-12-08: 50 mL
  Filled 2017-12-08: qty 100

## 2017-12-08 MED ORDER — BISACODYL 10 MG RE SUPP
10.0000 mg | Freq: Every day | RECTAL | Status: DC
  Administered 2017-12-09 – 2017-12-28 (×20): 10 mg via RECTAL
  Filled 2017-12-08 (×20): qty 1

## 2017-12-08 NOTE — Discharge Instructions (Signed)
Inpatient Rehab Discharge Instructions  Sherry [name] Discharge date and time: No discharge date for patient encounter.   Activities/Precautions/ Functional Status: Activity: back brace as directed Diet: regular diet Wound Care: keep wound clean and dry Functional status:  ___ No restrictions     ___ Walk up steps independently ___ 24/7 supervision/assistance   ___ Walk up steps with assistance ___ Intermittent supervision/assistance  ___ Bathe/dress independently ___ Walk with walker     _x__ Bathe/dress with assistance ___ Walk Independently    ___ Shower independently ___ Walk with assistance    ___ Shower with assistance ___ No alcohol     ___ Return to work/school ________  Special Instructions:    My questions have been answered and I understand these instructions. I will adhere to these goals and the provided educational materials after my discharge from the hospital.  Patient/Caregiver Signature _______________________________ Date __________  Clinician Signature _______________________________________ Date __________  Please bring this form and your medication list with you to all your follow-up doctor's appointments.

## 2017-12-08 NOTE — Progress Notes (Signed)
Physical Therapy Session Note  Patient Details  Name: Sherry [name] MRN: [id number] Date of Birth: 10-30-1934  Today's Date: 12/08/2017 PT Individual Time: 1400-1430 PT Individual Time Calculation (min): 30 min   Short Term Goals: Week 1:     Skilled Therapeutic Interventions/Progress Updates:    no c/o pain at rest.  Pt noted to have bilateral LE DVTs and awaiting IVC filter placement this afternoon.  Deferred mobility 2/2 same.  Session focus on patient education regarding rehab process, BP management with teds/aces, w/c positioning, and progression of mobility while on rehab.  Pt verbalized understanding of all, but education to continue throughout LOS to ensure maximum retention.  PT switched pt's w/c out for more appropriate fit with hybrid ROHO cushion to trial tomorrow.  Left with RN at bedside, call bell in reach and needs met.   Therapy Documentation Precautions:  Precautions Precautions: Cervical, Back, Fall Precaution Comments: back precautions due to thoracic fx s/p ACDF; needs TLSO for all OOB activity but per order set does not need cervical collar  Required Braces or Orthoses: Spinal Brace Spinal Brace: Thoracolumbosacral orthotic, Applied in sitting position Restrictions Weight Bearing Restrictions: No General: PT Amount of Missed Time (min): 30 Minutes PT Missed Treatment Reason: Other (Comment)(bilat LE DVTs awaiting IVC filter)    Therapy/Group: Individual Therapy  Michel Santee 12/08/2017, 2:35 PM

## 2017-12-08 NOTE — Progress Notes (Signed)
[facility] PHYSICAL MEDICINE & REHABILITATION PROGRESS NOTE   Subjective/Complaints: Restless night. Was uncomfortable with a lot of neck and back pain as well as pain radiating down both arms. No sense of foley or flatus  ROS: Patient denies fever, rash, sore throat, blurred vision, nausea, vomiting, diarrhea, cough, shortness of breath or chest pain,  or mood change.    Objective:   Dg Swallowing Func-speech Pathology  Result Date: 12/07/2017 Objective Swallowing Evaluation: Type of Study: MBS-Modified Barium Swallow Study  Patient Details Name: Sherry [name] MRN: [id number] Date of Birth: 11/29/1934 Today's Date: 12/07/2017 Time: SLP Start Time (ACUTE ONLY): 1050 -SLP Stop Time (ACUTE ONLY): 1110 SLP Time Calculation (min) (ACUTE ONLY): 20 min Past Medical History: Past Medical History: Diagnosis Date . Arthritis  . Hypertension  Past Surgical History: Past Surgical History: Procedure Laterality Date . ANTERIOR CERVICAL DECOMP/DISCECTOMY FUSION N/A 11/29/2017  Procedure: Cervical five-six, six-seven Anterior Cervical Decompression Fusion;  Surgeon: Judith Part, MD;  Location: [facility];  Service: Neurosurgery;  Laterality: N/A;  Cervical five-six, six-seven Anterior Cervical Decompression Fusion HPI: Pt is a 83 y.o. woman with PMH significant for HTN. She presented to [facility] ED 11/28/17 s/p fall with immediate quadriplegia; per MD notes, she has regained some LE function, does not recall the events. Underwent C5-6, C6-7 anterior cervical discectomy and fusion 11/29/2017 per Dr. Venetia Constable - was intubated and extubated same day during this procedure. Per RN note 11/5, pt reports difficulty swallowing.  No data recorded Assessment / Plan / Recommendation CHL IP CLINICAL IMPRESSIONS 12/07/2017 Clinical Impression Pt demonstrates mild impairments that impede pts comfort with PO but do not pose a significant barrier to intake or risk of aspiration. Pt is able to masticate adequately but typically  swallows in a piecemeal fashion with solids. Mild vallecular residue noted which pt appears to sense; this could be due to mild weakness on base of tongue or dry mucosa. A chin tuck is beneficial to open valleculae for better clearance with solids. slight delay in swallow initiation noted with thin; pt appears to swallow a bit a air with liquids.  No impact from cervical spine hardware. Overall pt is safe to continue thin liquids and solids though a chin tuck would assist in oropharyngeal clearance. Esophageal sweep also showed persistant mid esophageal stasis with pill and solids, no radiologist present to confirm. WIll f/u to assist in problem solving pts comfort with meals.   SLP Visit Diagnosis Dysphagia, oropharyngeal phase (R13.12) Attention and concentration deficit following -- Frontal lobe and executive function deficit following -- Impact on safety and function Mild aspiration risk   CHL IP TREATMENT RECOMMENDATION 12/07/2017 Treatment Recommendations Therapy as outlined in treatment plan below   Prognosis 12/07/2017 Prognosis for Safe Diet Advancement Good Barriers to Reach Goals -- Barriers/Prognosis Comment -- CHL IP DIET RECOMMENDATION 12/07/2017 SLP Diet Recommendations Regular solids;Thin liquid Liquid Administration via Cup;Straw Medication Administration Crushed with puree Compensations Slow rate;Small sips/bites Postural Changes Remain semi-upright after after feeds/meals (Comment)   No flowsheet data found.  CHL IP FOLLOW UP RECOMMENDATIONS 12/07/2017 Follow up Recommendations Inpatient Rehab   CHL IP FREQUENCY AND DURATION 12/07/2017 Speech Therapy Frequency (ACUTE ONLY) min 2x/week Treatment Duration 2 weeks      CHL IP ORAL PHASE 12/07/2017 Oral Phase Impaired Oral - Pudding Teaspoon -- Oral - Pudding Cup -- Oral - Honey Teaspoon -- Oral - Honey Cup -- Oral - Nectar Teaspoon -- Oral - Nectar Cup Piecemeal swallowing Oral - Nectar Straw -- Oral -  Thin Teaspoon -- Oral - Thin Cup Piecemeal swallowing  Oral - Thin Straw -- Oral - Puree Piecemeal swallowing Oral - Mech Soft -- Oral - Regular Piecemeal swallowing Oral - Multi-Consistency -- Oral - Pill -- Oral Phase - Comment --  CHL IP PHARYNGEAL PHASE 12/07/2017 Pharyngeal Phase Impaired Pharyngeal- Pudding Teaspoon -- Pharyngeal -- Pharyngeal- Pudding Cup -- Pharyngeal -- Pharyngeal- Honey Teaspoon -- Pharyngeal -- Pharyngeal- Honey Cup -- Pharyngeal -- Pharyngeal- Nectar Teaspoon -- Pharyngeal -- Pharyngeal- Nectar Cup -- Pharyngeal -- Pharyngeal- Nectar Straw WFL Pharyngeal -- Pharyngeal- Thin Teaspoon -- Pharyngeal -- Pharyngeal- Thin Cup WFL Pharyngeal -- Pharyngeal- Thin Straw WFL Pharyngeal -- Pharyngeal- Puree Pharyngeal residue - valleculae;Reduced tongue base retraction;Compensatory strategies attempted (with notebox) Pharyngeal -- Pharyngeal- Mechanical Soft -- Pharyngeal -- Pharyngeal- Regular Compensatory strategies attempted (with notebox) Pharyngeal -- Pharyngeal- Multi-consistency -- Pharyngeal -- Pharyngeal- Pill -- Pharyngeal -- Pharyngeal Comment --  CHL IP CERVICAL ESOPHAGEAL PHASE 12/07/2017 Cervical Esophageal Phase WFL Pudding Teaspoon -- Pudding Cup -- Honey Teaspoon -- Honey Cup -- Nectar Teaspoon -- Nectar Cup -- Nectar Straw -- Thin Teaspoon -- Thin Cup -- Thin Straw -- Puree -- Mechanical Soft -- Regular -- Multi-consistency -- Pill -- Cervical Esophageal Comment -- Lynann Beaver 12/07/2017, 2:09 PM              Recent Labs    12/07/17 2045 12/08/17 0525  WBC 8.9 8.6  HGB 11.2* 10.6*  HCT 35.4* 32.8*  PLT 304 277   Recent Labs    12/07/17 2045 12/08/17 0525  NA  --  139  K  --  3.6  CL  --  102  CO2  --  28  GLUCOSE  --  111*  BUN  --  18  CREATININE 0.54 0.51  CALCIUM  --  8.3*    Intake/Output Summary (Last 24 hours) at 12/08/2017 0832 Last data filed at 12/08/2017 0500 Gross per 24 hour  Intake -  Output 1000 ml  Net -1000 ml     Physical Exam: Vital Signs Blood pressure 115/62, pulse 80,  temperature 98 F (36.7 C), resp. rate 18, SpO2 91 %.  Constitutional: No distress . Vital signs reviewed. HEENT: EOMI, oral membranes moist Neck: ACDF site clean, voice dysphonic Cardiovascular: RRR without murmur. No JVD    Respiratory: CTA Bilaterally without wheezes or rales. Normal effort    GI: BS +, non-tender, non-distended  Musculoskeletal:  Dependent edema LE's, tr UE Uro: foley in place Neurological: She is alert.  Alert.  She is oriented to person, place and time. Motor: RUE: Shoulder abduction, elbow flex 3+/5, elbow extension, wrist extension 2/5, hand gip 1/5 LUE: Shoulder abduction, elbow flexion 3-/5, wrist and hand 1/5 B/l LE: tr to 1+/5 proximal to distal Sensation diminished to light touch b/l LE in distal UE and substantially below nipple line Skin:  Incision c/d/i, scattered bruising Psychiatric: She has a normal mood and affect. Her behavior is normal.    Assessment/Plan: 1. 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  Physiatrist is providing close team supervision and 24 hour management of active medical problems listed below.  Physiatrist and rehab team continue to assess barriers to discharge/monitor patient progress toward functional and medical goals  Care Tool:  Bathing              Bathing assist       Upper Body Dressing/Undressing Upper  body dressing   What is the patient wearing?: Hospital gown only    Upper body assist      Lower Body Dressing/Undressing Lower body dressing      What is the patient wearing?: Hospital gown only     Lower body assist       Toileting Toileting    Toileting assist       Transfers Chair/bed transfer  Transfers assist           Locomotion Ambulation   Ambulation assist              Walk 10 feet activity   Assist           Walk 50 feet  activity   Assist           Walk 150 feet activity   Assist           Walk 10 feet on uneven surface  activity   Assist           Wheelchair     Assist               Wheelchair 50 feet with 2 turns activity    Assist            Wheelchair 150 feet activity     Assist           Medical Problem List and Plan: 1.  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  Status post C5-6 and C6-7 anterior cervical discectomy and fusion 11/29/2017.  TLSO back brace when out of bed  -beginning therapies today 2.  DVT Prophylaxis/Anticoagulation:  SCD's.  Check vascular study  -pt with allergy to pork products. Spoke with daughter at length re: VTE risk given her dx. We'll start with dopplers today, and monitor closely for now, consider f/u dopplers q1-2 weeks. Arixtra is an option for rx, ?prophylaxis 3. Pain Management:   Neurontin 300 mg 3 times daily---increase to 400mg   -kpad  Valium 5 mg every 6 hours as needed muscle spasms  oxycodone as needed pain 4. Mood: Provide emotional support, very flat  -neuropsych assessment  5. Neuropsych: This patient is capable of making decisions on her own behalf. 6. Skin/Wound Care: Routine skin checks 7. Fluids/Electrolytes/Nutrition: encourage PO  -I personally reviewed the patient's labs today.    -add protein for hypoalbuminemia 8.  Neurogenic bowel and bladder.  Flomax 0.4 mg daily.    -dc foley soon     -Establish bowel program, senna-s + suppository 9.  Hypertension.  Norvasc 10 mg daily.  Monitor when out of bed 10. Acute blood loss anemia: hgb 10.6 today    LOS: 1 days A FACE TO FACE EVALUATION WAS PERFORMED  Meredith Staggers 12/08/2017, 8:32 AM

## 2017-12-08 NOTE — Progress Notes (Signed)
Results of doppler for LE noted positive results. Results called to Alcide Goodness, PAC. No new orders received. Double checking with Dr. Naaman Plummer 2/2 allergy to heparin. Margarito Liner

## 2017-12-08 NOTE — Progress Notes (Signed)
Pt returned to unit post IVC filter placement in bed. Alert, appears comfortable at present. Left groin gauze dressing with tegaderm covering is dry and intact. New orders received for bed rest/flat x 2 hours. Pt and family aware. Margarito Liner

## 2017-12-08 NOTE — Evaluation (Addendum)
Occupational Therapy Assessment and Plan  Patient Details  Name: Sherry [name] MRN: [id number] Date of Birth: 02-06-1934  OT Diagnosis: muscle weakness (generalized) and quadriparesis at level C6 Rehab Potential: Rehab Potential (ACUTE ONLY): Good ELOS: 25-28 days    Today's Date: 12/08/2017 OT Individual Time: 1105-1200 OT Individual Time Calculation (min): 55 min     Problem List:  Patient Active Problem List   Diagnosis Date Noted  . Trauma   . 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([place])   . Neuropathic pain   . Neurogenic bowel   . Neurogenic bladder   . Benign essential HTN   . Acute blood loss anemia   . Central cord syndrome at C6 level of cervical spinal cord ([place]) 11/29/2017  . Central cord syndrome ([place]) 11/29/2017    Past Medical History:  Past Medical History:  Diagnosis Date  . Arthritis   . Cancer ([place])    skin  . Hypertension    Past Surgical History:  Past Surgical History:  Procedure Laterality Date  . ANTERIOR CERVICAL DECOMP/DISCECTOMY FUSION N/A 11/29/2017   Procedure: Cervical five-six, six-seven Anterior Cervical Decompression Fusion;  Surgeon: Judith Part, MD;  Location: [place];  Service: Neurosurgery;  Laterality: N/A;  Cervical five-six, six-seven Anterior Cervical Decompression Fusion  . CATARACT EXTRACTION      Assessment & Plan Clinical Impression: Patient is a 83 y.o. year old female with history of hypertension.  Per chart review, family, and patient, lives with spouse.  Independent prior to admission.  Multilevel home with bed and bath on main level.  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  She has multiple family in the area.  Presented 11/29/2017 after a fall with immediate quadriparesis.  Cranial CT scan reviewed, unremarkable for acute intracranial process. CT/MRI imaging showed C6-7 disc fracture and tear of anterior longitudinal ligament.  Paraspinal muscle and interspinous edema from C1-C7 compatible ligament injury and muscle  strain.  Increased cord signal at C5 and C6.  No cord hemorrhage and also noted T11 coronal vertebral body fracture.  Underwent C5-6, C6-7 anterior cervical discectomy and fusion 11/29/2017 per Dr. Venetia Constable.  TLSO back brace when out of bed applied in sitting position.  Hospital course pain management.  Subcutaneous heparin for DVT prophylaxis.  Therapy evaluations completed with recommendations of physical medicine rehab consult. Patient transferred to CIR on 12/07/2017 .    Patient currently requires max-total with basic self-care skills secondary to muscle weakness and muscle paralysis, unbalanced muscle activation and decreased coordination and decreased postural control.  Prior to hospitalization, patient could complete ADLs/iADLs and mobility with independent .  Patient will benefit from skilled intervention to decrease level of assist with basic self-care skills and increase independence with basic self-care skills prior to discharge home with care partner (spouse and daughter).  Anticipate patient will require moderate physical assestance and follow up therapies TBD.  OT - End of Session Activity Tolerance: Tolerates 10 - 20 min activity with multiple rests Endurance Deficit: Yes OT Assessment Rehab Potential (ACUTE ONLY): Good OT Patient demonstrates impairments in the following area(s): Balance;Pain;Sensory;Endurance;Motor OT Basic ADL's Functional Problem(s): Eating;Grooming;Bathing;Dressing;Toileting OT Transfers Functional Problem(s): Toilet;Tub/Shower OT Additional Impairment(s): Fuctional Use of Upper Extremity OT Plan OT Intensity: Minimum of 1-2 x/day, 45 to 90 minutes OT Frequency: 5 out of 7 days OT Duration/Estimated Length of Stay: 25-28 days  OT Treatment/Interventions: Balance/vestibular training;Discharge planning;Pain management;Self Care/advanced ADL retraining;Therapeutic Activities;UE/LE Coordination activities;Cognitive remediation/compensation;Functional mobility  training;Patient/family education;Therapeutic Exercise;DME/adaptive equipment instruction;Psychosocial support;UE/LE Strength taining/ROM;Wheelchair propulsion/positioning;Splinting/orthotics;Neuromuscular re-education;Community  reintegration;Disease mangement/prevention;Skin care/wound managment OT Self Feeding Anticipated Outcome(s): minA OT Basic Self-Care Anticipated Outcome(s): modA OT Toileting Anticipated Outcome(s): modA OT Bathroom Transfers Anticipated Outcome(s): modA  OT Recommendation Patient destination: Home Follow Up Recommendations: Other (comment)(to be further assessed ) Equipment Recommended: To be determined   Skilled Therapeutic Intervention OT eval completed including explanation of CIR process, OT purpose, and overall POC.   OT treatment session held with focus of session on ADL retraining. Eval and treatment session completed at bed level as pt with recent positive dopplers, spoke with RN and okay to continue with bed level activity. Pt with some UE AROM but with diminished fine motor, able to assist with washing UB with setup of washcloth though pt limited due to pain with reaching/moving UEs, overall requiring maxA to complete. Pt requires maxA for LB bathing, able to reach the tops of her LEs with HOB raised. TotalA required to don hospital gown and TEDs this session. Completed rolling at bed level x1 to remove excess linens/scrub pants underneath pt which had been cut during earlier procedure to access LEs. Pt requiring totalA+2 for rolling to L/R. Pt with increased pain in UEs with moving, subsides with repositioning UEs and HOB to 42*. Pt left supine in bed end of session with bed alarm set, daughter and PA present, soft touch call bell and needs within reach.   OT Evaluation Precautions/Restrictions  Precautions Precautions: Cervical;Back;Fall Precaution Comments: back precautions due to thoracic fx s/p ACDF; needs TLSO for all OOB activity but per order set does  not need cervical collar  Required Braces or Orthoses: Spinal Brace Spinal Brace: Thoracolumbosacral orthotic;Applied in sitting position Restrictions Weight Bearing Restrictions: No General Chart Reviewed: Yes PT Missed Treatment Reason: Other (Comment)(bilat LE DVTs awaiting IVC filter) Response to Previous Treatment: Patient reporting fatigue but able to participate Family/Caregiver Present: Yes   Pain Pain Assessment Pain Scale: Faces Faces Pain Scale: Hurts whole lot Pain Type: Acute pain Pain Location: Arm Pain Orientation: Right;Left Pain Descriptors / Indicators: Tingling;Sore Pain Onset: With Activity Pain Intervention(s): Rest;Repositioned Multiple Pain Sites: No Home Living/Prior Functioning Home Living Available Help at Discharge: Family, Available PRN/intermittently Type of Home: House Home Access: Stairs to enter Technical brewer of Steps: 3 Entrance Stairs-Rails: Can reach both Home Layout: Multi-level, Able to live on main level with bedroom/bathroom, Laundry or work area in basement Alternate Therapist, sports of Steps: flight, does not need to go to basement as family can assist  Alternate Level Stairs-Rails: Right Bathroom Shower/Tub: Chiropodist: Standard  Lives With: Spouse IADL History Current License: Yes Mode of Transportation: Musician Leisure and Hobbies: plants/gardening  Prior Function Level of Independence: Independent with gait, Independent with transfers, Independent with basic ADLs, Independent with homemaking with ambulation  Able to Take Stairs?: Yes Driving: Yes Vocation: Retired Leisure: Hobbies-yes (Comment) Comments: 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, provided Supervision level assist for him ADL ADL Eating: Maximal assistance Where Assessed-Eating: Bed level Grooming: Maximal assistance Where Assessed-Grooming: Bed level Upper Body Bathing: Maximal assistance Where  Assessed-Upper Body Bathing: Bed level Lower Body Bathing: Maximal assistance Where Assessed-Lower Body Bathing: Bed level Upper Body Dressing: Maximal assistance Where Assessed-Upper Body Dressing: Bed level Lower Body Dressing: Not assessed(wearing hospital gown this session ) Vision Baseline Vision/History: Wears glasses Wears Glasses: At all times Patient Visual Report: No change from baseline Additional Comments: no apparent deficits at time of eval, will continue to assess  Perception  Perception: Within Functional Limits  Praxis Praxis: Intact Cognition Overall Cognitive Status: Within Functional Limits for tasks assessed Arousal/Alertness: Awake/alert Orientation Level: Person;Place;Situation Person: Oriented Place: Oriented Situation: Oriented Year: 2019 Month: November Day of Week: Correct Memory: Appears intact Immediate Memory Recall: Sock;Blue;Bed Memory Recall: Sock;Blue;Bed Memory Recall Sock: Without Cue Memory Recall Blue: Without Cue Memory Recall Bed: Without Cue Attention: Sustained Sustained Attention: Appears intact Awareness: Appears intact Problem Solving: Appears intact Safety/Judgment: Appears intact Sensation Sensation Light Touch: Impaired Detail Central sensation comments: incomplete impairment C6 and down Light Touch Impaired Details: Impaired RUE;Impaired LUE Proprioception: Impaired Detail Proprioception Impaired Details: Impaired RUE;Impaired LUE Coordination Gross Motor Movements are Fluid and Coordinated: No Fine Motor Movements are Fluid and Coordinated: No Coordination and Movement Description: pt with absent fine motor; impaired gross motor  Motor  Motor Motor: Tetraplegia Motor - Skilled Clinical Observations: incomplete quadraparesis C6 and below Mobility  Bed Mobility Bed Mobility: Rolling Right;Rolling Left Rolling Right: 2 Helpers Rolling Left: 2 Helpers  Trunk/Postural Assessment  Cervical Assessment Cervical  Assessment: Exceptions to WFL(pt currently with cervical precautions) Thoracic Assessment Thoracic Assessment: Exceptions to WFL(currently with back precautions )  Balance Balance Balance Assessed: No(bed level eval completed this session ) Extremity/Trunk Assessment RUE Assessment RUE Assessment: Exceptions to [facility] Passive Range of Motion (PROM) Comments: pt with baseline arthritis in bil hands reports difficulty making full fist PTA, noted tightness with digit flexion PROM Active Range of Motion (AROM) Comments: shoulder abduction grossly 0-80* gravity eliminated, full elbow ROM though effortful, unable to flex/extend digits  General Strength Comments: grossly 2-/5  LUE Assessment LUE Assessment: Exceptions to [facility] Passive Range of Motion (PROM) Comments: pt with baseline arthritis in bil hands reports difficulty making full fist PTA, noted tightness with digit flexion PROM Active Range of Motion (AROM) Comments: shoulder abduction grossly 0-80* gravity eliminated, full elbow ROM though effortful, unable to flex/extend digits  General Strength Comments: grossly 2-/5      Refer to Care Plan for Long Term Goals  Recommendations for other services: Neuropsych and Therapeutic Recreation  Stress management and Outing/community reintegration   Discharge Criteria: Patient will be discharged from OT if patient refuses treatment 3 consecutive times without medical reason, if treatment goals not met, if there is a change in medical status, if patient makes no progress towards goals or if patient is discharged from hospital.  The above assessment, treatment plan, treatment alternatives and goals were discussed and mutually agreed upon: by patient  Raymondo Band 12/08/2017, 3:52 PM

## 2017-12-08 NOTE — Progress Notes (Signed)
Preliminary notes--Bilateral lower extremities venous duplex exam completed. Acute mobile deep vein thrombosis noticed at common femoral vein and proximal femoral vein. Incidental finding: A  1.79x2.45x0.80cm cystic non-vascular structure seen at the popliteal fossa superior medial aspect.  Result attempted to call could not get hold with RN or MD. Karyl Kinnier Rumi Kolodziej(RDMS RVT) 12/08/17 11:09 AM

## 2017-12-08 NOTE — Progress Notes (Signed)
Venous Doppler studies completed bilateral lower extremity showing acute mobile deep vein thrombosis noticed that comment femoral vein and proximal femoral vein.patient with allergy to pork products thus no heparin therapy.Will consult interventional radiology for IVC filter placement. Discussed with family and they are in agreement.

## 2017-12-08 NOTE — Progress Notes (Signed)
Jamse Arn, MD  Physician  Physical Medicine and Rehabilitation  PMR Pre-admission  Addendum  Date of Service:  12/06/2017 8:12 AM       Related encounter: ED to Hosp-Admission (Discharged) from 11/28/2017 in [place] [facility]         Show:Clear all '[x]' Manual'[x]' Template'[x]' Copied  Added by: '[x]' Johnson Arizola, Evalee Mutton, RN  '[]' Hover for details PMR Admission Coordinator Pre-Admission Assessment  Patient: Sherry [name] is an 83 y.o., female MRN: [id number] DOB: 09-19-1934 Height: '5\' 7"'  (170.2 cm) Weight: 78.9 kg                                                                                                                                                  Insurance Information HMO:     PPO: Yes     PCP:       IPA:       80/20:       OTHER:            PRIMARY: [employer] medicare      Policy#: [id number]     Subscriber: patient CM Name: MServando Salina     Phone#: [phone]    Fax#: [phone]-Cert#: [id number] 11/6 to 12/12/17 with update due on 12/13/17      Employer: Not working in the past year Benefits:  Phone #: [phone]    Name: Online portal Eff. 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 Per chart review lives with spouse. Independent prior to admission. Multilevel home with bed and bath on main level. 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 She has multiple family in the area. Presented 11/29/2017 after a fall with immediate quadriparesis. Cranial CT scan negative. CT/MRI imaging showed C6-7 disc fracture and  tear of anterior longitudinal ligament. Paraspinal muscle and interspinous edema from C1-C7 compatible ligament injury and muscle strain. Increased cord signal at C5 and C6. No cord hemorrhage and also noted T11 coronal vertebral body fracture. Underwent C5-6, C6-7 anterior cervical discectomy and fusion 11/29/2017 per Dr. Venetia Constable. TLSO back brace when out of bed applied in sitting position. Hospital course pain management. Subcutaneous heparin for DVT prophylaxis. Therapy evaluations completed with recommendations of physical medicine rehab consult. Patient to be admitted for a comprehensive inpatient rehab program.  Past Medical History      Past Medical History:  Diagnosis Date  . Arthritis   . Hypertension     Family History  family history is not on file.  Prior Rehab/Hospitalizations: No previous rehab.  No previous hospitalizations.  Has the patient had major surgery during 100 days prior to admission? No  Current Medications   Current Facility-Administered Medications:  .  0.9 %  sodium chloride infusion, 250 mL, Intravenous, Continuous, Ostergard, Joyice Faster, MD, Stopped at 11/29/17 [phone] .  0.9 %  sodium chloride infusion, , Intravenous, Continuous, Ostergard, Joyice Faster, MD, Last Rate: 75 mL/hr at 12/01/17 1514 .  acetaminophen (TYLENOL) tablet 650 mg, 650 mg, Oral, Q4H PRN, 650 mg at 12/05/17 1213 **OR** acetaminophen (TYLENOL) suppository 650 mg, 650 mg, Rectal, Q4H PRN, Judith Part, MD .   acetaminophen (TYLENOL) tablet 1,000 mg, 1,000 mg, Oral, Q6H PRN, Costella, Vincent J, PA-C, 1,000 mg at 12/06/17 0750 .  amLODipine (NORVASC) tablet 10 mg, 10 mg, Oral, QPC supper, Judith Part, MD, 10 mg at 12/06/17 1842 .  bisacodyl (DULCOLAX) suppository 10 mg, 10 mg, Rectal, Daily PRN, Judith Part, MD, 10 mg at 12/05/17 2208 .  dexamethasone (DECADRON) 4 mg in sodium chloride 0.9 % 50 mL IVPB, 4 mg, Intravenous, Q8H, Ostergard, Thomas A, MD, Last Rate: 202 mL/hr at 12/07/17 1239, 4 mg at 12/07/17 1239 .  dextromethorphan-guaiFENesin (MUCINEX DM) 30-600 MG per 12 hr tablet 1 tablet, 1 tablet, Oral, BID, Judith Part, MD, 1 tablet at 12/07/17 0916 .  diazepam (VALIUM) tablet 5 mg, 5 mg, Oral, Q6H PRN, Judith Part, MD, 5 mg at 12/06/17 2226 .  gabapentin (NEURONTIN) capsule 300 mg, 300 mg, Oral, TID, Judith Part, MD, 300 mg at 12/07/17 0916 .  guaiFENesin-dextromethorphan (ROBITUSSIN DM) 100-10 MG/5ML syrup 5 mL, 5 mL, Oral, Q4H PRN, Judith Part, MD, 5 mL at 12/06/17 1010 .  heparin injection 5,000 Units, 5,000 Units, Subcutaneous, Q8H, Judith Part, MD, 5,000 Units at 12/04/17 [phone] .  HYDROmorphone (DILAUDID) injection 1 mg, 1 mg, Intravenous, Q3H PRN, Costella, Vincent J, PA-C, 1 mg at 12/07/17 0700 .  lactated ringers infusion, , Intravenous, Continuous, Ostergard, Joyice Faster, MD, Last Rate: 0 mL/hr at 11/29/17 1735 .  loratadine (CLARITIN) tablet 10 mg, 10 mg, Oral, Daily, Judith Part, MD, 10 mg at 12/07/17 0916 .  menthol-cetylpyridinium (CEPACOL) lozenge 3 mg, 1 lozenge, Oral, PRN, 3 mg at 11/30/17 0613 **OR** phenol (CHLORASEPTIC) mouth spray 1 spray, 1 spray, Mouth/Throat, PRN, Ostergard, Thomas A, MD .  metroNIDAZOLE (METROGEL) 4.74 % gel 1 application, 1 application, Topical, Daily PRN, Judith Part, MD .  multivitamin liquid 15 mL, 15 mL, Oral, Daily, Judith Part, MD, 15 mL at 12/07/17 0916 .  ondansetron (ZOFRAN)  tablet 4 mg, 4 mg, Oral, Q6H PRN **OR** ondansetron (ZOFRAN) injection 4 mg, 4 mg, Intravenous, Q6H PRN, Judith Part, MD, 4 mg at 11/29/17 1331 .  oxyCODONE (Oxy IR/ROXICODONE) immediate release tablet 10 mg, 10 mg, Oral, Q3H PRN, Judith Part, MD, 10 mg at 12/07/17 0515 .  oxyCODONE (Oxy IR/ROXICODONE) immediate release tablet 5 mg, 5 mg, Oral, Q3H PRN, Judith Part, MD, 5 mg at 12/07/17 1523 .  polyethylene glycol (MIRALAX / GLYCOLAX) packet 17 g, 17 g, Oral, Daily PRN, Judith Part, MD, 17 g at 12/06/17 1009 .  sodium chloride (OCEAN) 0.65 % nasal spray 1 spray, 1 spray, Each Nare, PRN, Judith Part, MD, 1 spray at 12/01/17 1241 .  sodium chloride (OCEAN) 0.65 % nasal spray 1 spray, 1 spray, Each Nare, PRN, Judith Part, MD, 1 spray at 12/03/17 0910 .  sodium chloride flush (NS) 0.9 % injection 3 mL, 3 mL, Intravenous, Q12H, Ostergard, Thomas A, MD, 3 mL at 12/07/17 1244 .  sodium chloride flush (NS) 0.9 % injection 3 mL, 3 mL, Intravenous, PRN, Ostergard, Thomas A, MD .  sorbitol 70 % solution 30 mL, 30 mL, Oral, Daily PRN, Judith Part, MD, 30 mL at 12/06/17 1013 .  tamsulosin (FLOMAX) capsule 0.4 mg, 0.4 mg, Oral, Daily, Costella, Vincent J, PA-C, 0.4 mg at 12/07/17 0916 .  triamcinolone cream (KENALOG) 0.1 % 1 application, 1 application, Topical, Daily PRN, Judith Part, MD  Patients Current Diet:     Diet Order                  Diet regular Room service appropriate? Yes; Fluid consistency: Thin  Diet effective now               Precautions / Restrictions Precautions Precautions: Fall, Cervical, Back, Other (comment) Precaution Booklet Issued: No Precaution Comments: back precautions due to thoracic fx s/p ACDF; needs TLSO for all OOB activity but per order set does not need cervical collar  Spinal Brace: Thoracolumbosacral orthotic, Applied in sitting position(today applied in supine for  testing) Restrictions Weight Bearing Restrictions: No   Has the patient had 2 or more falls or a fall with injury in the past year?Yes.  Patient has had one fall which resulted in current hospital admission with significant injury.  Prior Activity Level Community (5-7x/wk): Went out 3-4 days a week, was driving.  Home Assistive Devices / Equipment Home Assistive Devices/Equipment: None Home Equipment: None  Prior Device Use: Indicate devices/aids used by the patient prior to current illness, exacerbation or injury? None of the above and None  Prior Functional Level Prior Function Level of Independence: Independent Comments: caregiver for husband, who has memory problems,  and 2 other family members, very independent at baseline   Self Care: Did the patient need help bathing, dressing, using the toilet or eating?  Independent  Indoor Mobility: Did the patient need assistance with walking from room to room (with or without device)? Independent  Stairs: Did the patient need assistance with internal or external stairs (with or without device)? Independent  Functional Cognition: Did the patient need help planning regular tasks such as shopping or remembering to take medications? Independent  Current Functional Level Cognition  Overall Cognitive Status: Within Functional Limits for tasks assessed Orientation Level: Oriented X4 General Comments: Pt  fell asleep multiple times during exercise.  Distracted by pain today.  Very difficult to get comfortable     Extremity Assessment (includes Sensation/Coordination)  Upper Extremity Assessment: RUE deficits/detail RUE Deficits / Details: triceps 2/5; hand 1/5 (able to palpate contraction and minimal visible small amount of extension index finger with max effort  RUE Sensation: decreased light touch, decreased proprioception RUE Coordination: decreased fine motor, decreased gross motor LUE Deficits / Details: tricep 2/5; 1/5  finger flex/ext - able to visibly see small excursion of movement with max effort from pt (wrist stabilized in neutral)  LUE Sensation: decreased light touch, decreased proprioception LUE Coordination: decreased fine motor, decreased gross motor  Lower Extremity Assessment: Defer to PT evaluation RLE Deficits / Details: MMT grossly 2- to 2/5 B LEs  RLE Sensation: decreased light touch, decreased proprioception RLE Coordination: decreased fine motor, decreased gross motor LLE Deficits / Details: MMT grossly 2- to 2/5 B LEs  LLE Sensation: decreased light touch, decreased proprioception LLE Coordination: decreased fine motor, decreased gross motor    ADLs  Overall ADL's : Needs assistance/impaired Eating/Feeding: Total assistance, Bed level Grooming: Wash/dry hands, Wash/dry face, Oral care, Brushing hair, Total assistance, Bed level Upper Body Bathing: Total assistance, Bed level Lower Body Bathing: Total assistance, Bed level Upper Body Dressing : Total assistance, Bed level Lower Body Dressing: Total assistance, Bed level Toilet Transfer: Total assistance Toilet Transfer Details (indicate cue type and reason): unable  Toileting- Clothing Manipulation and Hygiene: Total assistance, Bed level Functional mobility during ADLs: Total assistance, +2 for physical assistance General ADL Comments: requires total A for all aspects     Mobility  Overal bed mobility: Needs Assistance Bed Mobility: Rolling Rolling: Total assist Sidelying to sit: Total assist, +2 for physical assistance, +2 for safety/equipment Supine to sit: +2 for physical assistance, Max assist, +2 for safety/equipment Sit to sidelying: Total assist, +2 for physical assistance, +2 for safety/equipment General bed mobility comments: pt practicing roll in a normal movement pattern many times to donn TLSO for MBS testing    Transfers  Overall transfer level: Needs assistance Transfers: Lateral/Scoot Transfers   Lateral/Scoot Transfers: Total assist, +2 physical assistance General transfer comment: cues and hand over hand assist of UE's and truncal assist    Ambulation / Gait / Stairs / Wheelchair Mobility  Ambulation/Gait General Gait Details: DNT, fatigue     Posture / Balance Dynamic Sitting Balance Sitting balance - Comments: max assist >5 min on balance at EOB.  limited by back pain. Balance Overall balance assessment: Needs assistance, History of Falls Sitting-balance support: Bilateral upper extremity supported, No upper extremity supported Sitting balance-Leahy Scale: Poor Sitting balance - Comments: max assist >5 min on balance at EOB.  limited by back pain. Standing balance-Leahy Scale: Zero High Level Balance Comments: Pt unable to stand     Special needs/care consideration BiPAP/CPAP No CPM No Continuous Drip IV No Dialysis No       Life Vest No Oxygen No Special Bed No Trach Size no Wound Vac (area) No     Skin Has cervical neck post op incision and a dressing to left elbow injury                              Bowel mgmt: Last BM 11/28/17 Bladder mgmt: Urinary catheter Diabetic mgmt No    Previous Home Environment Living Arrangements: Spouse/significant other Available Help at Discharge: Family, Available PRN/intermittently Type of Home: House Home Layout: Multi-level, Laundry or work area in basement, Able to live on main level with bedroom/bathroom Alternate Level Stairs-Number of Steps: flight, does not need to go to basement as family can assist  Home Access: Stairs to enter Entrance Stairs-Rails: Can reach both Entrance Stairs-Number of Steps: 3 Bathroom Shower/Tub: Chiropodist:  Standard Home Care Services: No Additional Comments: family reports they also own a separate home that is level and has ramped enterance that she could stay at   Discharge Living Setting Plans for Discharge Living Setting: Patient's home, Lives with  (comment)(Lives with husband.) Type of Home at Discharge: House Discharge Home Layout: Two level, Laundry or work area in basement, Able to live on main level with bedroom/bathroom(Laundry in basement.) Alternate Level Stairs-Number of Steps: Flight to basement Discharge Home Access: Stairs to enter Technical brewer of Steps: 3 steps at the front entry Discharge Bathroom Shower/Tub: Tub/shower unit, Door Discharge Bathroom Toilet: Standard Discharge Bathroom Accessibility: Yes How Accessible: Accessible via walker Does the patient have any problems obtaining your medications?: No  Social/Family/Support Systems Patient Roles: Spouse, Parent(Has disabled husband and daughters.) Contact Information: [name] - daughter Anticipated Caregiver: Daughters and hired help Anticipated Ambulance person Information: Waylan Rocher - daughter - [phone] Ability/Limitations of Caregiver: 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  Daughters work.  Family is financially able to hire help as needed. Caregiver Availability: Other (Comment)(Family understands that patient likely to need 24/7 assist.) Discharge Plan Discussed with Primary Caregiver: Yes Is Caregiver In Agreement with Plan?: Yes Does Caregiver/Family have Issues with Lodging/Transportation while Pt is in Rehab?: No  Goals/Additional Needs Patient/Family Goal for Rehab: PT/OT min assist W/C level goals Expected length of stay: 21-27 days Cultural Considerations: None Dietary Needs: Regular diet, thin liquids Equipment Needs: TBD Pt/Family Agrees to Admission and willing to participate: Yes Program Orientation Provided & Reviewed with Pt/Caregiver Including Roles  & Responsibilities: Yes  Decrease burden of Care through IP rehab admission: Decrease number of caregivers, Bowel and bladder program and Patient/family education  Possible need for SNF placement upon discharge: Yes.  Family hopes to hire assistance  and provide care at home.  They are financially able to hire assistance.  Patient Condition: This patient's medical and functional status has changed since the consult dated: 12/01/17 in which the Rehabilitation Physician determined and documented that the patient's condition is appropriate for intensive rehabilitative care in an inpatient rehabilitation facility. See "History of Present Illness" (above) for medical update. Functional changes are: Currently requiring total assist +2 for all mobility. Patient's medical and functional status update has been discussed with the Rehabilitation physician and patient remains appropriate for inpatient rehabilitation. Will admit to inpatient rehab today.  Preadmission Screen Completed By:  Retta Diones, 12/07/2017 3:25 PM ______________________________________________________________________   Discussed status with Dr. Posey Pronto on 12/07/17 at 1409 and received telephone approval for admission today.  Admission Coordinator:  Retta Diones, time 1409/Date 12/07/17       Revision History

## 2017-12-08 NOTE — Progress Notes (Signed)
Physical Therapy Assessment and Plan  Patient Details  Name: Sherry [name] MRN: [id number] Date of Birth: 1934-03-07  PT Diagnosis: Impaired sensation, Muscle weakness and Quadriplegia Rehab Potential: Fair ELOS: 28-30 days   Today's Date: 12/08/2017 PT Individual Time: 1400-1430 PT Individual Time Calculation (min): 30 min    Problem List:  Patient Active Problem List   Diagnosis Date Noted  . Trauma   . 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([place])   . Neuropathic pain   . Neurogenic bowel   . Neurogenic bladder   . Benign essential HTN   . Acute blood loss anemia   . Central cord syndrome at C6 level of cervical spinal cord ([place]) 11/29/2017  . Central cord syndrome ([place]) 11/29/2017    Past Medical History:  Past Medical History:  Diagnosis Date  . Arthritis   . Cancer ([place])    skin  . Hypertension    Past Surgical History:  Past Surgical History:  Procedure Laterality Date  . ANTERIOR CERVICAL DECOMP/DISCECTOMY FUSION N/A 11/29/2017   Procedure: Cervical five-six, six-seven Anterior Cervical Decompression Fusion;  Surgeon: Judith Part, MD;  Location: [facility];  Service: Neurosurgery;  Laterality: N/A;  Cervical five-six, six-seven Anterior Cervical Decompression Fusion  . CATARACT EXTRACTION      Assessment & Plan Clinical Impression:  Sherry [name] is an 83 year old right-handed female with history of hypertension.  Per chart review, family, and patient, lives with spouse.  Independent prior to admission.  Multilevel home with bed and bath on main level.  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  She has multiple family in the area.  Presented 11/29/2017 after a fall with immediate quadriparesis.  Cranial CT scan reviewed, unremarkable for acute intracranial process. CT/MRI imaging showed C6-7 disc fracture and tear of anterior longitudinal ligament.  Paraspinal muscle and interspinous edema from C1-C7 compatible ligament injury and muscle strain.  Increased  cord signal at C5 and C6.  No cord hemorrhage and also noted T11 coronal vertebral body fracture.  Underwent C5-6, C6-7 anterior cervical discectomy and fusion 11/29/2017 per Dr. Venetia Constable.  TLSO back brace when out of bed applied in sitting position.  Hospital course pain management.  Subcutaneous heparin for DVT prophylaxis.  Therapy evaluations completed with recommendations of physical medicine rehab consult.  Patient was admitted for a comprehensive rehab program. Patient transferred to CIR on 12/07/2017 .   Patient currently requires total with mobility secondary to muscle weakness, muscle joint tightness and muscle paralysis, abnormal tone and decreased sitting balance, decreased postural control and decreased balance strategies.  Prior to hospitalization, patient was independent  with mobility and lived with Spouse in a House home.  Home access is 3Stairs to enter.  Patient will benefit from skilled PT intervention to maximize safe functional mobility, minimize fall risk and decrease caregiver burden for planned discharge home with 24 hour assist.  Anticipate patient will benefit from follow up [facility] at discharge.  PT - End of Session Activity Tolerance: Tolerates 10 - 20 min activity with multiple rests Endurance Deficit: Yes Endurance Deficit Description: fatigues quickly in sitting position PT Assessment Rehab Potential (ACUTE/IP ONLY): Fair PT Barriers to Discharge: Medical stability;Home environment access/layout PT Patient demonstrates impairments in the following area(s): Balance;Edema;Endurance;Motor;Pain;Sensory;Safety;Skin Integrity PT Transfers Functional Problem(s): Bed Mobility;Car;Bed to Chair;Furniture;Floor PT Locomotion Functional Problem(s): Ambulation;Wheelchair Mobility;Stairs PT Plan PT Intensity: Minimum of 1-2 x/day ,45 to 90 minutes PT Frequency: 5 out of 7 days PT Duration Estimated Length of Stay: 28-30 days PT Treatment/Interventions: Medical illustrator  training;Community reintegration;Discharge planning;Disease management/prevention;DME/adaptive equipment instruction;Functional electrical stimulation;Functional mobility training;Neuromuscular re-education;Pain management;Patient/family education;Psychosocial support;Skin care/wound management;Splinting/orthotics;Therapeutic Activities;Therapeutic Exercise;UE/LE Strength taining/ROM;UE/LE Coordination activities;Wheelchair propulsion/positioning PT Transfers Anticipated Outcome(s): Mod A PT Locomotion Anticipated Outcome(s): Mod A at w/c level PT Recommendation Recommendations for Other Services: Neuropsych consult;Therapeutic Recreation consult Therapeutic Recreation Interventions: Pet therapy;Stress management;Outing/community reintergration Follow Up Recommendations: Home health PT Patient destination: Home Equipment Recommended: Wheelchair (measurements);Wheelchair cushion (measurements);To be determined Equipment Details: w/c TBD pending progress  Skilled Therapeutic Intervention Evaluation completed (see details above and below) with education on PT POC and goals and individual treatment initiated with focus on education with patient and her daughter about rehab expectations, goals, etc. Pt received supine in bed, agreeable to PT eval. Pt reports ongoing N/T pain sensation throughout BUE and upper back area, some relief with repositioning and able to received pain medication from RN at end of therapy session. Pain not rated. Rolling L/R with total A x 2 to dependently don pants. Supine to sitting EOB with total A x 2. Sitting balance EOB with total A, dependent to don TLSO while seated. Pt tends to lean posteriorly and to the R in sitting. Sliding board transfer bed to w/c with assist x 2-3. Pt's hips slide forward and she becomes unsafe during transfer, assist x 3 to safely reposition in TIS w/c. Maximove transfer w/c to bed. Pt left semi-reclined in bed with needs in reach, bed alarm in place,  daughter present.  PT Evaluation Precautions/Restrictions Precautions Precautions: Cervical;Back;Fall Precaution Comments: back precautions due to thoracic fx s/p ACDF; needs TLSO for all OOB activity but per order set does not need cervical collar  Required Braces or Orthoses: Spinal Brace Spinal Brace: Thoracolumbosacral orthotic;Applied in sitting position Restrictions Weight Bearing Restrictions: No General Oxygen Therapy SpO2: 96 % O2 Device: Nasal Cannula O2 Flow Rate (L/min): 1 L/min Home Living/Prior Functioning Home Living Available Help at Discharge: Family;Available PRN/intermittently Type of Home: House Home Access: Stairs to enter [employer] of Steps: 3 Entrance Stairs-Rails: Can reach both Home Layout: Multi-level;Able to live on main level with bedroom/bathroom;Laundry or work area in Building surveyor of Steps: flight, does not need to go to basement as family can assist  Alternate Level Stairs-Rails: Right Bathroom Shower/Tub: Chiropodist: Standard  Lives With: Spouse Prior Function Level of Independence: Independent with gait;Independent with transfers  Able to Take Stairs?: Yes Driving: Yes Vocation: Retired Leisure: Hobbies-yes (Comment)(gardening, watching TV) Comments: 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, provided Supervision level assist for him Vision/Perception  Vision - Assessment Additional Comments: no apparent deficits at time of eval, will continue to assess  Perception Perception: Within Functional Limits Praxis Praxis: Intact  Cognition Overall Cognitive Status: Within Functional Limits for tasks assessed Arousal/Alertness: Awake/alert Orientation Level: Oriented X4 Attention: Sustained Sustained Attention: Appears intact Memory: Appears intact Awareness: Appears intact Problem Solving: Appears intact Safety/Judgment: Appears  intact Sensation Sensation Light Touch: Impaired Detail Central sensation comments: incomplete impairment C6 and down Light Touch Impaired Details: Absent RLE;Absent LLE Proprioception: Impaired Detail Proprioception Impaired Details: Absent RLE;Absent LLE Coordination Gross Motor Movements are Fluid and Coordinated: No Fine Motor Movements are Fluid and Coordinated: No Coordination and Movement Description: pt with absent fine motor; impaired gross motor  Motor  Motor Motor: Tetraplegia Motor - Skilled Clinical Observations: incomplete quadraparesis C6 and below, tone in BLE  Mobility Bed Mobility Bed Mobility: Rolling Right;Rolling Left;Supine to Sit;Sit to Supine Rolling Right: 2 Helpers Rolling Left: 2 Helpers Supine  to Sit: 2 Helpers Sit to Supine: 2 Helpers Transfers Transfers: Adult nurse via Arts development officer Transfers: 2 Helpers;Other/comment(3 helpers for safety) Lateral Transfer Comment: unsafe due to poor trunk and BLE control Transfer via Lift Equipment: Restaurant manager, fast food / Additional Locomotion Stairs: No Wheelchair Mobility Wheelchair Mobility: No  Trunk/Postural Assessment  Cervical Assessment Cervical Assessment: Exceptions to WFL(unable to assess ROM due to precautions) Thoracic Assessment Thoracic Assessment: Exceptions to WFL(unable to assess due to precautions) Lumbar Assessment Lumbar Assessment: Exceptions to WFL(posterior pelvic tilt in sitting) Postural Control Postural Control: Deficits on evaluation Trunk Control: impaired Righting Reactions: impaired Protective Responses: impaired Postural Limitations: impaired sitting balance due to no control of trunk musculature  Balance Balance Balance Assessed: Yes Static Sitting Balance Static Sitting - Balance Support: No upper extremity supported;Feet supported Static Sitting - Level of Assistance: 1: +1 Total assist Dynamic Sitting Balance Dynamic  Sitting - Balance Support: No upper extremity supported;Feet supported Dynamic Sitting - Level of Assistance: 1: +2 Total assist Extremity Assessment  RUE Assessment RUE Assessment: Exceptions to [facility] Passive Range of Motion (PROM) Comments: pt with baseline arthritis in bil hands reports difficulty making full fist PTA, noted tightness with digit flexion PROM Active Range of Motion (AROM) Comments: shoulder abduction grossly 0-80* gravity eliminated, full elbow ROM though effortful, unable to flex/extend digits  General Strength Comments: grossly 2-/5  LUE Assessment LUE Assessment: Exceptions to [facility] Passive Range of Motion (PROM) Comments: pt with baseline arthritis in bil hands reports difficulty making full fist PTA, noted tightness with digit flexion PROM Active Range of Motion (AROM) Comments: shoulder abduction grossly 0-80* gravity eliminated, full elbow ROM though effortful, unable to flex/extend digits  General Strength Comments: grossly 2-/5  RLE Assessment RLE Assessment: Exceptions to [facility] Passive Range of Motion (PROM) Comments: limited by increased tone General Strength Comments: impaired, 0/5 in hip/knee/ankle, pt able to wiggle toes RLE Tone RLE Tone Comments: increased in RLE LLE Assessment LLE Assessment: Exceptions to [facility] Passive Range of Motion (PROM) Comments: limited by increased tone General Strength Comments: impaired, 0/5 in hip, knee, ankle, pt able to wiggle toes LLE Tone LLE Tone Comments: increased in LLE    Refer to Care Plan for Long Term Goals  Recommendations for other services: Neuropsych and Therapeutic Recreation  Pet therapy, Stress management and Outing/community reintegration  Discharge Criteria: Patient will be discharged from PT if patient refuses treatment 3 consecutive times without medical reason, if treatment goals not met, if there is a change in medical status, if patient makes no progress towards goals or if patient is discharged from  hospital.  The above assessment, treatment plan, treatment alternatives and goals were discussed and mutually agreed upon: by patient and by family  Excell Seltzer, PT, DPT  12/08/2017, 4:02 PM

## 2017-12-08 NOTE — Consult Note (Signed)
Chief Complaint: Patient was seen in consultation today for retrievable inferior vena cava filter placement at the request of Dr Daleen Squibb  Supervising Physician: Markus Daft  Patient Status: [facility] - In-pt  History of Present Illness: Sherry [name] is a 83 y.o. female   Presented to ED 11/29/17 after fall with immediate quadriplegia  CT/MRI imaging showed C6-7 disc fracture and tear of anterior longitudinal ligament.  Paraspinal muscle and interspinous edema from C1-C7 compatible ligament injury and muscle strain.  Increased cord signal at C5 and C6.  No cord hemorrhage and also noted T11 coronal vertebral body fracture.  Underwent C5-6, C6-7 anterior cervical discectomy and fusion 11/29/2017 per Dr. Venetia Constable.  Post op pain; leg swelling [place] Today: Prelim Bilateral lower extremities venous duplex exam completed. Acute mobile deep vein thrombosis noticed at common femoral vein and proximal femoral vein. Incidental finding  Cannot anticoagulate: Pork allergy; fall risk   Request for retrievable IVC filter  Dr Anselm Pancoast has reviewed chart and imaging Approves procedure   Past Medical History:  Diagnosis Date  . Arthritis   . Cancer ([place])    skin  . Hypertension     Past Surgical History:  Procedure Laterality Date  . ANTERIOR CERVICAL DECOMP/DISCECTOMY FUSION N/A 11/29/2017   Procedure: Cervical five-six, six-seven Anterior Cervical Decompression Fusion;  Surgeon: Judith Part, MD;  Location: [place];  Service: Neurosurgery;  Laterality: N/A;  Cervical five-six, six-seven Anterior Cervical Decompression Fusion  . CATARACT EXTRACTION      Allergies: Lisinopril-hydrochlorothiazide; Beef-derived products; Dairy aid [lactase]; Lisinopril-hydrochlorothiazide; and Pork-derived products  Medications: Prior to Admission medications   Medication Sig Start Date End Date Taking? Authorizing Provider  amLODipine (NORVASC) 10 MG tablet Take 10 mg by mouth daily after supper.     [provider]  Ascorbic Acid (VITAMIN C PO) Take 1 tablet by mouth daily.    [provider]  b complex vitamins tablet Take 1 tablet by mouth daily.    [provider]  Cholecalciferol (VITAMIN D PO) Take 1 tablet by mouth daily.     [provider]  dextromethorphan-guaiFENesin (MUCINEX DM) 30-600 MG 12hr tablet Take 1 tablet by mouth 2 (two) times daily. 12/06/17   Judith Part, MD  diazepam (VALIUM) 5 MG tablet Take 1 tablet (5 mg total) by mouth every 6 (six) hours as needed for muscle spasms. 12/06/17   Judith Part, MD  gabapentin (NEURONTIN) 300 MG capsule Take 1 capsule (300 mg total) by mouth 3 (three) times daily. 12/06/17   Judith Part, MD  heparin 5000 UNIT/ML injection Inject 1 mL (5,000 Units total) into the skin every 8 (eight) hours. 12/06/17   Judith Part, MD  loratadine (CLARITIN) 10 MG tablet Take 1 tablet (10 mg total) by mouth daily. 12/06/17   Judith Part, MD  menthol-cetylpyridinium (CEPACOL) 3 MG lozenge Take 1 lozenge (3 mg total) by mouth as needed for sore throat (sore throat). 12/06/17   Judith Part, MD  metronidazole (NORITATE) 1 % cream Apply 1 application topically daily as needed (rosacea).    [provider]  Multiple Vitamin (MULTIVITAMIN WITH MINERALS) TABS tablet Take 1 tablet by mouth daily.    [provider]  naproxen sodium (ALEVE) 220 MG tablet Take 220 mg by mouth 2 (two) times daily as needed (pain/headache).    [provider]  oxyCODONE (OXY IR/ROXICODONE) 5 MG immediate release tablet Take 1 tablet (5 mg total) by mouth every 3 (three)  hours as needed for moderate pain ((score 4 to 6)). 12/06/17   Judith Part, MD  oxyCODONE 10 MG TABS Take 1 tablet (10 mg total) by mouth every 3 (three) hours as needed for severe pain ((score 7 to 10)). 12/06/17   Judith Part, MD  predniSONE (DELTASONE) 20 MG tablet Take 2 tablets (40 mg total) by mouth  daily. 07/02/15   Harvest Dark, MD  tamsulosin (FLOMAX) 0.4 MG CAPS capsule Take 1 capsule (0.4 mg total) by mouth daily. 12/06/17   Judith Part, MD  triamcinolone cream (KENALOG) 0.1 % Apply 1 application topically daily as needed (eczema).    [provider]     Family History  Problem Relation Age of Onset  . Breast cancer Maternal Aunt 75    Social History   Socioeconomic History  . Marital status: Single    Spouse name: Not on file  . Number of children: Not on file  . Years of education: Not on file  . Highest education level: Not on file  Occupational History  . Not on file  Social Needs  . Financial resource strain: Not on file  . Food insecurity:    Worry: Not on file    Inability: Not on file  . Transportation needs:    Medical: Not on file    Non-medical: Not on file  Tobacco Use  . Smoking status: Never Smoker  . Smokeless tobacco: Never Used  Substance and Sexual Activity  . Alcohol use: Never    Frequency: Never  . Drug use: Never  . Sexual activity: Not on file  Lifestyle  . Physical activity:    Days per week: Not on file    Minutes per session: Not on file  . Stress: Not on file  Relationships  . Social connections:    Talks on phone: Not on file    Gets together: Not on file    Attends religious service: Not on file    Active member of club or organization: Not on file    Attends meetings of clubs or organizations: Not on file    Relationship status: Not on file  Other Topics Concern  . Not on file  Social History Narrative   ** Merged History Encounter **        Review of Systems: A 12 point ROS discussed and pertinent positives are indicated in the HPI above.  All other systems are negative.  Review of Systems  Constitutional: Positive for activity change. Negative for fever.  Respiratory: Negative for cough.   Gastrointestinal: Negative for abdominal pain.  Neurological: Positive for weakness.    Psychiatric/Behavioral: Negative for behavioral problems and confusion.    Vital Signs: BP 115/62 (BP Location: Right Arm)   Pulse 80   Temp 98 F (36.7 C)   Resp 18   SpO2 91%   Physical Exam  Constitutional: She is oriented to person, place, and time.  Cardiovascular: Normal rate and regular rhythm.  Pulmonary/Chest: Effort normal and breath sounds normal.  Abdominal: Soft. Bowel sounds are normal.  Musculoskeletal:  quadriplegia  Neurological: She is alert and oriented to person, place, and time.  Skin: Skin is warm and dry.  Psychiatric: She has a normal mood and affect. Her behavior is normal. Judgment and thought content normal.  Vitals reviewed.   Imaging: Dg Cervical Spine 2-3 Views  Result Date: 11/29/2017 CLINICAL DATA:  Elective surgical spine surgery. EXAM: CERVICAL SPINE - 2-3 VIEW; DG C-ARM 61-120 MIN  COMPARISON:  MRI 11/29/2017 and CT 11/28/2017 FINDINGS: Vertebral body alignment and heights are within normal. There is mild spondylosis present. Anterior fusion hardware is seen at the C5-6 level. Recommend correlation with findings at the time of the procedure. IMPRESSION: Mild spondylosis with anterior fusion hardware at the C5-6 level. Electronically Signed   By: Marin Olp M.D.   On: 11/29/2017 20:24   Dg Thoracic Spine 2 View  Result Date: 12/01/2017 CLINICAL DATA:  t spine fx. Patient was not able to move or feel her legs could not be done standing. Done laying flat on table EXAM: THORACIC SPINE 2 VIEWS COMPARISON:  CT 11/28/2017 FINDINGS: The anterior T11 vertebral body fracture seen to advantage on prior CT is noted. No loss of height. Anterior cervical fixation hardware C5-C7. Vertebral endplate spurring at multiple levels in the lower thoracic spine. IMPRESSION: T11 vertebral body fracture. Postop changes C5-C7 Electronically Signed   By: Lucrezia Europe M.D.   On: 12/01/2017 19:55   Dg Elbow Complete Left  Result Date: 11/28/2017 CLINICAL DATA:  Fall from  porch with laceration left elbow. EXAM: LEFT ELBOW - COMPLETE 3+ VIEW COMPARISON:  None. FINDINGS: There is no evidence of fracture, dislocation, or joint effusion. There is no evidence of arthropathy or other focal bone abnormality. Soft tissues are unremarkable. IMPRESSION: Negative. Electronically Signed   By: Marin Olp M.D.   On: 11/28/2017 22:11   Ct Head Wo Contrast  Result Date: 11/28/2017 CLINICAL DATA:  Head trauma after fall. EXAM: CT HEAD WITHOUT CONTRAST CT CERVICAL SPINE WITHOUT CONTRAST TECHNIQUE: Multidetector CT imaging of the head and cervical spine was performed following the standard protocol without intravenous contrast. Multiplanar CT image reconstructions of the cervical spine were also generated. COMPARISON:  None. FINDINGS: CT HEAD FINDINGS Brain: Minimal small vessel ischemic disease of periventricular white matter. Mild age related involutional changes of the brain. No acute intracranial hemorrhage, infarct, hydrocephalus, extra-axial fluid collection or mass. Midline fourth ventricle and basal cisterns without effacement. Vascular: No hyperdense vessel sign. Skull: Intact Sinuses/Orbits: Nonacute Other: None CT CERVICAL SPINE FINDINGS Alignment: Mild straightening of cervical lordosis. Minimal anterolisthesis of C3 on C4 likely on the basis of degenerative disc disease. Skull base and vertebrae: Erosive change about the atlantodental interval with remodeled appearance of the anterior arch of C1 and about the odontoid process. Sclerotic focus noted of the C7 vertebral body, nonspecific in isolation but more likely to represent a bone island. No acute cervical spine fracture. Uncovertebral joint osteoarthritis at C5-6 and C6-7 bilaterally greatest at the cervix. Mild-to-moderate moderate bilateral foraminal encroachment at these levels. Soft tissues and spinal canal: No prevertebral soft tissue swelling or visible canal hematoma. Disc levels: Marked disc flattening at C5-6 and  moderate-to-marked at C6-7. Multilevel degenerative facet arthropathy. No jumped or perched facets. Upper chest: Negative Other: None IMPRESSION: No acute intracranial abnormality. No cervical spine fracture. Cervical spondylosis. Electronically Signed   By: Ashley Royalty M.D.   On: 11/28/2017 23:55   Ct Chest W Contrast  Result Date: 11/29/2017 CLINICAL DATA:  83 y/o F; fall with lacerations to the elbows and the right-sided chin. Decreased sensation from the mid chest and below. No movement, sensation, or response to painful stimuli of the lower extremities. EXAM: CT CHEST, ABDOMEN, AND PELVIS WITH CONTRAST CT THORACIC SPINE WITHOUT CONTRAST CT LUMBAR SPINE WITHOUT CONTRAST TECHNIQUE: Multidetector CT imaging of the chest, abdomen and pelvis was performed following the standard protocol during bolus administration of intravenous contrast. Multidetector CT imaging of  the thoracic and lumbar spine were reconstructed from the CT of chest, abdomen, and pelvis images. Multiplanar CT image reconstructions were also generated. CONTRAST:  147mL OMNIPAQUE IOHEXOL 300 MG/ML  SOLN COMPARISON:  None. FINDINGS: CT CHEST FINDINGS Cardiovascular: No significant vascular findings. Normal heart size. No pericardial effusion. Mediastinum/Nodes: Subcentimeter nodule within the left lobe of the thyroid gland. No mediastinal or axillary lymphadenopathy. Patent central airways. Lungs/Pleura: Lungs are clear. No pleural effusion or pneumothorax. Musculoskeletal: No proximal humerus, clavicle, scapula, or rib fracture identified. CT ABDOMEN PELVIS FINDINGS Hepatobiliary: No hepatic injury or perihepatic hematoma. Gallbladder is unremarkable Pancreas: Unremarkable. No pancreatic ductal dilatation or surrounding inflammatory changes. Spleen: No splenic injury or perisplenic hematoma. Adrenals/Urinary Tract: No adrenal hemorrhage or renal injury identified. Bladder is unremarkable. Subcentimeter renal cysts bilaterally. Stomach/Bowel:  Stomach is within normal limits. Appendix appears normal. No evidence of bowel wall thickening, distention, or inflammatory changes. Vascular/Lymphatic: Aortic atherosclerosis. No enlarged abdominal or pelvic lymph nodes. Reproductive: Calcified uterine myomas measuring up to 2.2 cm extending anteriorly from the lower uterine segment. Uterus and bilateral adnexa are otherwise unremarkable. Other: No abdominal wall hernia or abnormality. No abdominopelvic ascites. Musculoskeletal: No proximal femur or pelvic fracture identified. CT THORACIC SPINE FINDINGS Alignment: Normal. Vertebrae: Acute obliquely oriented fracture of the T11 vertebral body extending from the anterior surface of the vertebral body to the mid inferior endplate with minimal displacement. The fracture does not propagate into the pedicles or posterior elements. No associated listhesis. No appreciable epidural hematoma. No additional acute fracture identified. Paraspinal and other soft tissues: Extensive prevertebral edema is present extending from the T6-L1 levels. Disc levels: There are multilevel mild disc and facet degenerative changes of the thoracic spine. No high-grade bony foraminal or canal stenosis. CT LUMBAR SPINE FINDINGS Segmentation: 5 lumbar type vertebrae. Alignment: L4-5 and L5-S1 grade 1 anterolisthesis with prominent facet arthropathy. Slight T12-L1 retrolisthesis. Vertebrae: No acute fracture or focal pathologic process of the lumbar spine. Paraspinal and other soft tissues: As above. Disc levels: Moderate loss of intervertebral disc space height is present at T12-L1 and L1-2. Mild loss of intervertebral disc space height is present at L4-5 and L5-S1. There are multilevel disc bulges which combined with facet hypertrophy result in mild-to-moderate foraminal stenosis from L2 through S1. Facet arthropathy is greatest at L4-5 and L5-S1. anterolisthesis combined with disc and facet degenerative changes at L4-5 results in at least  moderate canal stenosis. IMPRESSION: 1. Acute fracture through the anterior aspect of T11 vertebral body and inferior endplate. No dislocation or listhesis. No appreciable epidural hematoma. Extensive prevertebral edema extending from T6-L1, likely related to fracture and soft tissue injury. Consider thoracic MRI to assess for cord contusion. 2. No additional fracture or internal injury of chest, abdomen, or pelvis identified. These results were called by telephone at the time of interpretation on 11/29/2017 at 12:00 am to Dr. Toy Cookey , who verbally acknowledged these results. Electronically Signed   By: Kristine Garbe M.D.   On: 11/29/2017 00:02   Ct Cervical Spine Wo Contrast  Result Date: 11/28/2017 CLINICAL DATA:  Head trauma after fall. EXAM: CT HEAD WITHOUT CONTRAST CT CERVICAL SPINE WITHOUT CONTRAST TECHNIQUE: Multidetector CT imaging of the head and cervical spine was performed following the standard protocol without intravenous contrast. Multiplanar CT image reconstructions of the cervical spine were also generated. COMPARISON:  None. FINDINGS: CT HEAD FINDINGS Brain: Minimal small vessel ischemic disease of periventricular white matter. Mild age related involutional changes of the brain. No acute intracranial hemorrhage, infarct,  hydrocephalus, extra-axial fluid collection or mass. Midline fourth ventricle and basal cisterns without effacement. Vascular: No hyperdense vessel sign. Skull: Intact Sinuses/Orbits: Nonacute Other: None CT CERVICAL SPINE FINDINGS Alignment: Mild straightening of cervical lordosis. Minimal anterolisthesis of C3 on C4 likely on the basis of degenerative disc disease. Skull base and vertebrae: Erosive change about the atlantodental interval with remodeled appearance of the anterior arch of C1 and about the odontoid process. Sclerotic focus noted of the C7 vertebral body, nonspecific in isolation but more likely to represent a bone island. No acute cervical  spine fracture. Uncovertebral joint osteoarthritis at C5-6 and C6-7 bilaterally greatest at the cervix. Mild-to-moderate moderate bilateral foraminal encroachment at these levels. Soft tissues and spinal canal: No prevertebral soft tissue swelling or visible canal hematoma. Disc levels: Marked disc flattening at C5-6 and moderate-to-marked at C6-7. Multilevel degenerative facet arthropathy. No jumped or perched facets. Upper chest: Negative Other: None IMPRESSION: No acute intracranial abnormality. No cervical spine fracture. Cervical spondylosis. Electronically Signed   By: Ashley Royalty M.D.   On: 11/28/2017 23:55   Mr Cervical Spine Wo Contrast  Result Date: 11/29/2017 CLINICAL DATA:  83 y/o  F; fall with immediate quadriplegia. EXAM: MRI CERVICAL SPINE WITHOUT CONTRAST MRI THORACIC SPINE WITHOUT CONTRAST TECHNIQUE: Multiplanar and multiecho pulse sequences of the cervical spine, to include the craniocervical junction and cervicothoracic junction and thoracic spine, were obtained without intravenous contrast. COMPARISON:  11/28/2017 CT of cervical and thoracic spine. FINDINGS: MRI CERVICAL SPINE FINDINGS Alignment: Straightening of cervical lordosis. C7-T1 grade 1 anterolisthesis. Vertebrae: No acute fracture. Increased signal within the odontoid process, lateral masses of C1, and the anterior arch of C1 with erosive changes on CT and surrounding soft tissue pannus is likely related to rheumatoid or crystal deposition disease. There is increased signal within the C6-7 intervertebral disc and a defect within the anterior longitudinal ligament (series 7, image 7) compatible with disc fracture and ligament tear. No associated malalignment. Additionally, there is extensive increased signal within the paraspinal muscles and interspinous spaces from C1-C7 likely representing muscle strain and ligamentous injury. Cord: Increased cord signal at the C5 and C6 levels. Given findings of ligamentous injury and disc  injury, this is likely cord contusion. No findings of cord hemorrhage. Posterior Fossa, vertebral arteries, paraspinal tissues: As above. Disc levels: C2-3: Right greater than left facet hypertrophy. Mild right foraminal stenosis. No canal stenosis. C3-4: Mild disc osteophyte complex with left-greater-than-right uncovertebral and facet hypertrophy. Severe left and moderate right foraminal stenosis. Mild canal stenosis. C4-5: Mild disc osteophyte complex with bilateral uncovertebral and facet hypertrophy. Moderate bilateral foraminal stenosis and mild canal stenosis. C5-6: Moderate disc osteophyte complex with advanced uncovertebral and facet hypertrophy. Severe bilateral foraminal stenosis and moderate canal stenosis. Disc contact on the anterior cord. C6-7: Moderate disc osteophyte complex with bilateral advanced uncovertebral and facet hypertrophy. Severe bilateral foraminal stenosis. Moderate canal stenosis. Disc contact on the anterior cord. C7-T1: Grade 1 anterolisthesis with small uncovered disc bulge and moderate bilateral facet hypertrophy as well as ligamentum flavum hypertrophy. Mild foraminal stenosis and canal stenosis. MRI THORACIC SPINE FINDINGS Alignment:  Physiologic. Vertebrae: Acute T11 vertebral body fracture extending through the anterior vertebral body, to the inferior endplate, and across the disc. No displacement, dislocation, or malalignment. Associated paravertebral edema extending from T6-L1. No additional fracture of the thoracic spine identified. No findings of discitis. Cord:  Normal signal and morphology. Paraspinal and other soft tissues: As above. Disc levels: Multilevel mild discogenic degenerative changes with tiny disc protrusions. No  significant foraminal or canal stenosis. IMPRESSION: MRI cervical spine: 1. C6-7 disc fracture and tear of anterior longitudinal ligament. 2. Paraspinal muscle and interspinous edema from C1-C7 compatible with ligament injury and muscle strain. 3. No  cervical spine osseous fracture or dislocation. 4. Increased cord signal at C5 and C6, likely cord contusion. No cord hemorrhage. 5. Edema within C1 and C2 vertebral bodies with small erosion and pannus on CT, likely sequelae of rheumatoid or crystal deposition disease. 6. Multilevel cervical spondylosis greatest at C5-6 and C6-7 where there is moderate canal stenosis. MRI thoracic spine: 1. Acute T11 vertebral body fracture extending through anterior vertebral body to inferior endplate across the disc. No dislocation. 2. No additional fracture of the thoracic spine. No abnormal cord signal. 3. Mild discogenic degenerative changes of thoracic spine. No significant foraminal or canal stenosis. These results were called by telephone at the time of interpretation on 11/29/2017 at 3:47 am to Dr. Christy Gentles, who verbally acknowledged these results. Electronically Signed   By: Kristine Garbe M.D.   On: 11/29/2017 03:50   Mr Thoracic Spine Wo Contrast  Result Date: 11/29/2017 CLINICAL DATA:  83 y/o  F; fall with immediate quadriplegia. EXAM: MRI CERVICAL SPINE WITHOUT CONTRAST MRI THORACIC SPINE WITHOUT CONTRAST TECHNIQUE: Multiplanar and multiecho pulse sequences of the cervical spine, to include the craniocervical junction and cervicothoracic junction and thoracic spine, were obtained without intravenous contrast. COMPARISON:  11/28/2017 CT of cervical and thoracic spine. FINDINGS: MRI CERVICAL SPINE FINDINGS Alignment: Straightening of cervical lordosis. C7-T1 grade 1 anterolisthesis. Vertebrae: No acute fracture. Increased signal within the odontoid process, lateral masses of C1, and the anterior arch of C1 with erosive changes on CT and surrounding soft tissue pannus is likely related to rheumatoid or crystal deposition disease. There is increased signal within the C6-7 intervertebral disc and a defect within the anterior longitudinal ligament (series 7, image 7) compatible with disc fracture and ligament  tear. No associated malalignment. Additionally, there is extensive increased signal within the paraspinal muscles and interspinous spaces from C1-C7 likely representing muscle strain and ligamentous injury. Cord: Increased cord signal at the C5 and C6 levels. Given findings of ligamentous injury and disc injury, this is likely cord contusion. No findings of cord hemorrhage. Posterior Fossa, vertebral arteries, paraspinal tissues: As above. Disc levels: C2-3: Right greater than left facet hypertrophy. Mild right foraminal stenosis. No canal stenosis. C3-4: Mild disc osteophyte complex with left-greater-than-right uncovertebral and facet hypertrophy. Severe left and moderate right foraminal stenosis. Mild canal stenosis. C4-5: Mild disc osteophyte complex with bilateral uncovertebral and facet hypertrophy. Moderate bilateral foraminal stenosis and mild canal stenosis. C5-6: Moderate disc osteophyte complex with advanced uncovertebral and facet hypertrophy. Severe bilateral foraminal stenosis and moderate canal stenosis. Disc contact on the anterior cord. C6-7: Moderate disc osteophyte complex with bilateral advanced uncovertebral and facet hypertrophy. Severe bilateral foraminal stenosis. Moderate canal stenosis. Disc contact on the anterior cord. C7-T1: Grade 1 anterolisthesis with small uncovered disc bulge and moderate bilateral facet hypertrophy as well as ligamentum flavum hypertrophy. Mild foraminal stenosis and canal stenosis. MRI THORACIC SPINE FINDINGS Alignment:  Physiologic. Vertebrae: Acute T11 vertebral body fracture extending through the anterior vertebral body, to the inferior endplate, and across the disc. No displacement, dislocation, or malalignment. Associated paravertebral edema extending from T6-L1. No additional fracture of the thoracic spine identified. No findings of discitis. Cord:  Normal signal and morphology. Paraspinal and other soft tissues: As above. Disc levels: Multilevel mild  discogenic degenerative changes with tiny disc protrusions. No  significant foraminal or canal stenosis. IMPRESSION: MRI cervical spine: 1. C6-7 disc fracture and tear of anterior longitudinal ligament. 2. Paraspinal muscle and interspinous edema from C1-C7 compatible with ligament injury and muscle strain. 3. No cervical spine osseous fracture or dislocation. 4. Increased cord signal at C5 and C6, likely cord contusion. No cord hemorrhage. 5. Edema within C1 and C2 vertebral bodies with small erosion and pannus on CT, likely sequelae of rheumatoid or crystal deposition disease. 6. Multilevel cervical spondylosis greatest at C5-6 and C6-7 where there is moderate canal stenosis. MRI thoracic spine: 1. Acute T11 vertebral body fracture extending through anterior vertebral body to inferior endplate across the disc. No dislocation. 2. No additional fracture of the thoracic spine. No abnormal cord signal. 3. Mild discogenic degenerative changes of thoracic spine. No significant foraminal or canal stenosis. These results were called by telephone at the time of interpretation on 11/29/2017 at 3:47 am to Dr. Christy Gentles, who verbally acknowledged these results. Electronically Signed   By: Kristine Garbe M.D.   On: 11/29/2017 03:50   Ct Abdomen Pelvis W Contrast  Result Date: 11/29/2017 CLINICAL DATA:  83 y/o F; fall with lacerations to the elbows and the right-sided chin. Decreased sensation from the mid chest and below. No movement, sensation, or response to painful stimuli of the lower extremities. EXAM: CT CHEST, ABDOMEN, AND PELVIS WITH CONTRAST CT THORACIC SPINE WITHOUT CONTRAST CT LUMBAR SPINE WITHOUT CONTRAST TECHNIQUE: Multidetector CT imaging of the chest, abdomen and pelvis was performed following the standard protocol during bolus administration of intravenous contrast. Multidetector CT imaging of the thoracic and lumbar spine were reconstructed from the CT of chest, abdomen, and pelvis images.  Multiplanar CT image reconstructions were also generated. CONTRAST:  135mL OMNIPAQUE IOHEXOL 300 MG/ML  SOLN COMPARISON:  None. FINDINGS: CT CHEST FINDINGS Cardiovascular: No significant vascular findings. Normal heart size. No pericardial effusion. Mediastinum/Nodes: Subcentimeter nodule within the left lobe of the thyroid gland. No mediastinal or axillary lymphadenopathy. Patent central airways. Lungs/Pleura: Lungs are clear. No pleural effusion or pneumothorax. Musculoskeletal: No proximal humerus, clavicle, scapula, or rib fracture identified. CT ABDOMEN PELVIS FINDINGS Hepatobiliary: No hepatic injury or perihepatic hematoma. Gallbladder is unremarkable Pancreas: Unremarkable. No pancreatic ductal dilatation or surrounding inflammatory changes. Spleen: No splenic injury or perisplenic hematoma. Adrenals/Urinary Tract: No adrenal hemorrhage or renal injury identified. Bladder is unremarkable. Subcentimeter renal cysts bilaterally. Stomach/Bowel: Stomach is within normal limits. Appendix appears normal. No evidence of bowel wall thickening, distention, or inflammatory changes. Vascular/Lymphatic: Aortic atherosclerosis. No enlarged abdominal or pelvic lymph nodes. Reproductive: Calcified uterine myomas measuring up to 2.2 cm extending anteriorly from the lower uterine segment. Uterus and bilateral adnexa are otherwise unremarkable. Other: No abdominal wall hernia or abnormality. No abdominopelvic ascites. Musculoskeletal: No proximal femur or pelvic fracture identified. CT THORACIC SPINE FINDINGS Alignment: Normal. Vertebrae: Acute obliquely oriented fracture of the T11 vertebral body extending from the anterior surface of the vertebral body to the mid inferior endplate with minimal displacement. The fracture does not propagate into the pedicles or posterior elements. No associated listhesis. No appreciable epidural hematoma. No additional acute fracture identified. Paraspinal and other soft tissues: Extensive  prevertebral edema is present extending from the T6-L1 levels. Disc levels: There are multilevel mild disc and facet degenerative changes of the thoracic spine. No high-grade bony foraminal or canal stenosis. CT LUMBAR SPINE FINDINGS Segmentation: 5 lumbar type vertebrae. Alignment: L4-5 and L5-S1 grade 1 anterolisthesis with prominent facet arthropathy. Slight T12-L1 retrolisthesis. Vertebrae: No acute fracture or focal  pathologic process of the lumbar spine. Paraspinal and other soft tissues: As above. Disc levels: Moderate loss of intervertebral disc space height is present at T12-L1 and L1-2. Mild loss of intervertebral disc space height is present at L4-5 and L5-S1. There are multilevel disc bulges which combined with facet hypertrophy result in mild-to-moderate foraminal stenosis from L2 through S1. Facet arthropathy is greatest at L4-5 and L5-S1. anterolisthesis combined with disc and facet degenerative changes at L4-5 results in at least moderate canal stenosis. IMPRESSION: 1. Acute fracture through the anterior aspect of T11 vertebral body and inferior endplate. No dislocation or listhesis. No appreciable epidural hematoma. Extensive prevertebral edema extending from T6-L1, likely related to fracture and soft tissue injury. Consider thoracic MRI to assess for cord contusion. 2. No additional fracture or internal injury of chest, abdomen, or pelvis identified. These results were called by telephone at the time of interpretation on 11/29/2017 at 12:00 am to Dr. Toy Cookey , who verbally acknowledged these results. Electronically Signed   By: Kristine Garbe M.D.   On: 11/29/2017 00:02   Dg Pelvis Portable  Result Date: 11/28/2017 CLINICAL DATA:  [place] [address] off porch tonight. EXAM: PORTABLE PELVIS 1-2 VIEWS COMPARISON:  None. FINDINGS: Diffuse osteopenia. Mild symmetric degenerative change of the hips. Subtle lucency along the cortex of the lateral aspect of the left femoral head as well as subtle  linear lucency over the lateral aspect of the right femoral head near the subcapital region as these findings are likely within normal although cannot exclude nondisplaced fractures. Degenerative change of the spine. IMPRESSION: Subtle lucencies over the lateral aspect of the femoral heads bilaterally likely within normal although cannot exclude fracture. CT may be helpful for further evaluation. Mild degenerative change of the hips and spine. Electronically Signed   By: Marin Olp M.D.   On: 11/28/2017 22:02   Ct T-spine No Charge  Result Date: 11/29/2017 CLINICAL DATA:  83 y/o F; fall with lacerations to the elbows and the right-sided chin. Decreased sensation from the mid chest and below. No movement, sensation, or response to painful stimuli of the lower extremities. EXAM: CT CHEST, ABDOMEN, AND PELVIS WITH CONTRAST CT THORACIC SPINE WITHOUT CONTRAST CT LUMBAR SPINE WITHOUT CONTRAST TECHNIQUE: Multidetector CT imaging of the chest, abdomen and pelvis was performed following the standard protocol during bolus administration of intravenous contrast. Multidetector CT imaging of the thoracic and lumbar spine were reconstructed from the CT of chest, abdomen, and pelvis images. Multiplanar CT image reconstructions were also generated. CONTRAST:  123mL OMNIPAQUE IOHEXOL 300 MG/ML  SOLN COMPARISON:  None. FINDINGS: CT CHEST FINDINGS Cardiovascular: No significant vascular findings. Normal heart size. No pericardial effusion. Mediastinum/Nodes: Subcentimeter nodule within the left lobe of the thyroid gland. No mediastinal or axillary lymphadenopathy. Patent central airways. Lungs/Pleura: Lungs are clear. No pleural effusion or pneumothorax. Musculoskeletal: No proximal humerus, clavicle, scapula, or rib fracture identified. CT ABDOMEN PELVIS FINDINGS Hepatobiliary: No hepatic injury or perihepatic hematoma. Gallbladder is unremarkable Pancreas: Unremarkable. No pancreatic ductal dilatation or surrounding  inflammatory changes. Spleen: No splenic injury or perisplenic hematoma. Adrenals/Urinary Tract: No adrenal hemorrhage or renal injury identified. Bladder is unremarkable. Subcentimeter renal cysts bilaterally. Stomach/Bowel: Stomach is within normal limits. Appendix appears normal. No evidence of bowel wall thickening, distention, or inflammatory changes. Vascular/Lymphatic: Aortic atherosclerosis. No enlarged abdominal or pelvic lymph nodes. Reproductive: Calcified uterine myomas measuring up to 2.2 cm extending anteriorly from the lower uterine segment. Uterus and bilateral adnexa are otherwise unremarkable. Other: No abdominal wall hernia or  abnormality. No abdominopelvic ascites. Musculoskeletal: No proximal femur or pelvic fracture identified. CT THORACIC SPINE FINDINGS Alignment: Normal. Vertebrae: Acute obliquely oriented fracture of the T11 vertebral body extending from the anterior surface of the vertebral body to the mid inferior endplate with minimal displacement. The fracture does not propagate into the pedicles or posterior elements. No associated listhesis. No appreciable epidural hematoma. No additional acute fracture identified. Paraspinal and other soft tissues: Extensive prevertebral edema is present extending from the T6-L1 levels. Disc levels: There are multilevel mild disc and facet degenerative changes of the thoracic spine. No high-grade bony foraminal or canal stenosis. CT LUMBAR SPINE FINDINGS Segmentation: 5 lumbar type vertebrae. Alignment: L4-5 and L5-S1 grade 1 anterolisthesis with prominent facet arthropathy. Slight T12-L1 retrolisthesis. Vertebrae: No acute fracture or focal pathologic process of the lumbar spine. Paraspinal and other soft tissues: As above. Disc levels: Moderate loss of intervertebral disc space height is present at T12-L1 and L1-2. Mild loss of intervertebral disc space height is present at L4-5 and L5-S1. There are multilevel disc bulges which combined with facet  hypertrophy result in mild-to-moderate foraminal stenosis from L2 through S1. Facet arthropathy is greatest at L4-5 and L5-S1. anterolisthesis combined with disc and facet degenerative changes at L4-5 results in at least moderate canal stenosis. IMPRESSION: 1. Acute fracture through the anterior aspect of T11 vertebral body and inferior endplate. No dislocation or listhesis. No appreciable epidural hematoma. Extensive prevertebral edema extending from T6-L1, likely related to fracture and soft tissue injury. Consider thoracic MRI to assess for cord contusion. 2. No additional fracture or internal injury of chest, abdomen, or pelvis identified. These results were called by telephone at the time of interpretation on 11/29/2017 at 12:00 am to Dr. Toy Cookey , who verbally acknowledged these results. Electronically Signed   By: Kristine Garbe M.D.   On: 11/29/2017 00:02   Ct L-spine No Charge  Result Date: 11/29/2017 CLINICAL DATA:  83 y/o F; fall with lacerations to the elbows and the right-sided chin. Decreased sensation from the mid chest and below. No movement, sensation, or response to painful stimuli of the lower extremities. EXAM: CT CHEST, ABDOMEN, AND PELVIS WITH CONTRAST CT THORACIC SPINE WITHOUT CONTRAST CT LUMBAR SPINE WITHOUT CONTRAST TECHNIQUE: Multidetector CT imaging of the chest, abdomen and pelvis was performed following the standard protocol during bolus administration of intravenous contrast. Multidetector CT imaging of the thoracic and lumbar spine were reconstructed from the CT of chest, abdomen, and pelvis images. Multiplanar CT image reconstructions were also generated. CONTRAST:  163mL OMNIPAQUE IOHEXOL 300 MG/ML  SOLN COMPARISON:  None. FINDINGS: CT CHEST FINDINGS Cardiovascular: No significant vascular findings. Normal heart size. No pericardial effusion. Mediastinum/Nodes: Subcentimeter nodule within the left lobe of the thyroid gland. No mediastinal or axillary  lymphadenopathy. Patent central airways. Lungs/Pleura: Lungs are clear. No pleural effusion or pneumothorax. Musculoskeletal: No proximal humerus, clavicle, scapula, or rib fracture identified. CT ABDOMEN PELVIS FINDINGS Hepatobiliary: No hepatic injury or perihepatic hematoma. Gallbladder is unremarkable Pancreas: Unremarkable. No pancreatic ductal dilatation or surrounding inflammatory changes. Spleen: No splenic injury or perisplenic hematoma. Adrenals/Urinary Tract: No adrenal hemorrhage or renal injury identified. Bladder is unremarkable. Subcentimeter renal cysts bilaterally. Stomach/Bowel: Stomach is within normal limits. Appendix appears normal. No evidence of bowel wall thickening, distention, or inflammatory changes. Vascular/Lymphatic: Aortic atherosclerosis. No enlarged abdominal or pelvic lymph nodes. Reproductive: Calcified uterine myomas measuring up to 2.2 cm extending anteriorly from the lower uterine segment. Uterus and bilateral adnexa are otherwise unremarkable. Other: No abdominal wall  hernia or abnormality. No abdominopelvic ascites. Musculoskeletal: No proximal femur or pelvic fracture identified. CT THORACIC SPINE FINDINGS Alignment: Normal. Vertebrae: Acute obliquely oriented fracture of the T11 vertebral body extending from the anterior surface of the vertebral body to the mid inferior endplate with minimal displacement. The fracture does not propagate into the pedicles or posterior elements. No associated listhesis. No appreciable epidural hematoma. No additional acute fracture identified. Paraspinal and other soft tissues: Extensive prevertebral edema is present extending from the T6-L1 levels. Disc levels: There are multilevel mild disc and facet degenerative changes of the thoracic spine. No high-grade bony foraminal or canal stenosis. CT LUMBAR SPINE FINDINGS Segmentation: 5 lumbar type vertebrae. Alignment: L4-5 and L5-S1 grade 1 anterolisthesis with prominent facet arthropathy.  Slight T12-L1 retrolisthesis. Vertebrae: No acute fracture or focal pathologic process of the lumbar spine. Paraspinal and other soft tissues: As above. Disc levels: Moderate loss of intervertebral disc space height is present at T12-L1 and L1-2. Mild loss of intervertebral disc space height is present at L4-5 and L5-S1. There are multilevel disc bulges which combined with facet hypertrophy result in mild-to-moderate foraminal stenosis from L2 through S1. Facet arthropathy is greatest at L4-5 and L5-S1. anterolisthesis combined with disc and facet degenerative changes at L4-5 results in at least moderate canal stenosis. IMPRESSION: 1. Acute fracture through the anterior aspect of T11 vertebral body and inferior endplate. No dislocation or listhesis. No appreciable epidural hematoma. Extensive prevertebral edema extending from T6-L1, likely related to fracture and soft tissue injury. Consider thoracic MRI to assess for cord contusion. 2. No additional fracture or internal injury of chest, abdomen, or pelvis identified. These results were called by telephone at the time of interpretation on 11/29/2017 at 12:00 am to Dr. Toy Cookey , who verbally acknowledged these results. Electronically Signed   By: Kristine Garbe M.D.   On: 11/29/2017 00:02   Dg Chest Port 1 View  Result Date: 11/28/2017 CLINICAL DATA:  [place] [address] off porch tonight with decreased sensation below nipple. EXAM: PORTABLE CHEST 1 VIEW COMPARISON:  None. FINDINGS: Lungs are adequately inflated without focal airspace consolidation, effusion or pneumothorax. Cardiomediastinal silhouette is within normal. Degenerative change of the spine. No fracture. IMPRESSION: No acute findings. Electronically Signed   By: Marin Olp M.D.   On: 11/28/2017 21:59   Dg Swallowing Func-speech Pathology  Result Date: 12/07/2017 Objective Swallowing Evaluation: Type of Study: MBS-Modified Barium Swallow Study  Patient Details Name: MERRIAM BRANDNER MRN:  [id number] Date of Birth: Jul 26, 1934 Today's Date: 12/07/2017 Time: SLP Start Time (ACUTE ONLY): 1050 -SLP Stop Time (ACUTE ONLY): 1110 SLP Time Calculation (min) (ACUTE ONLY): 20 min Past Medical History: Past Medical History: Diagnosis Date . Arthritis  . Hypertension  Past Surgical History: Past Surgical History: Procedure Laterality Date . ANTERIOR CERVICAL DECOMP/DISCECTOMY FUSION N/A 11/29/2017  Procedure: Cervical five-six, six-seven Anterior Cervical Decompression Fusion;  Surgeon: Judith Part, MD;  Location: [place];  Service: Neurosurgery;  Laterality: N/A;  Cervical five-six, six-seven Anterior Cervical Decompression Fusion HPI: Pt is a 83 y.o. woman with PMH significant for HTN. She presented to [facility] ED 11/28/17 s/p fall with immediate quadriplegia; per MD notes, she has regained some LE function, does not recall the events. Underwent C5-6, C6-7 anterior cervical discectomy and fusion 11/29/2017 per Dr. Venetia Constable - was intubated and extubated same day during this procedure. Per RN note 11/5, pt reports difficulty swallowing.  No data recorded Assessment / Plan / Recommendation CHL IP CLINICAL IMPRESSIONS 12/07/2017 Clinical Impression Pt demonstrates mild  impairments that impede pts comfort with PO but do not pose a significant barrier to intake or risk of aspiration. Pt is able to masticate adequately but typically swallows in a piecemeal fashion with solids. Mild vallecular residue noted which pt appears to sense; this could be due to mild weakness on base of tongue or dry mucosa. A chin tuck is beneficial to open valleculae for better clearance with solids. slight delay in swallow initiation noted with thin; pt appears to swallow a bit a air with liquids.  No impact from cervical spine hardware. Overall pt is safe to continue thin liquids and solids though a chin tuck would assist in oropharyngeal clearance. Esophageal sweep also showed persistant mid esophageal stasis with pill and solids, no  radiologist present to confirm. WIll f/u to assist in problem solving pts comfort with meals.   SLP Visit Diagnosis Dysphagia, oropharyngeal phase (R13.12) Attention and concentration deficit following -- Frontal lobe and executive function deficit following -- Impact on safety and function Mild aspiration risk   CHL IP TREATMENT RECOMMENDATION 12/07/2017 Treatment Recommendations Therapy as outlined in treatment plan below   Prognosis 12/07/2017 Prognosis for Safe Diet Advancement Good Barriers to Reach Goals -- Barriers/Prognosis Comment -- CHL IP DIET RECOMMENDATION 12/07/2017 SLP Diet Recommendations Regular solids;Thin liquid Liquid Administration via Cup;Straw Medication Administration Crushed with puree Compensations Slow rate;Small sips/bites Postural Changes Remain semi-upright after after feeds/meals (Comment)   No flowsheet data found.  CHL IP FOLLOW UP RECOMMENDATIONS 12/07/2017 Follow up Recommendations Inpatient Rehab   CHL IP FREQUENCY AND DURATION 12/07/2017 Speech Therapy Frequency (ACUTE ONLY) min 2x/week Treatment Duration 2 weeks      CHL IP ORAL PHASE 12/07/2017 Oral Phase Impaired Oral - Pudding Teaspoon -- Oral - Pudding Cup -- Oral - Honey Teaspoon -- Oral - Honey Cup -- Oral - Nectar Teaspoon -- Oral - Nectar Cup Piecemeal swallowing Oral - Nectar Straw -- Oral - Thin Teaspoon -- Oral - Thin Cup Piecemeal swallowing Oral - Thin Straw -- Oral - Puree Piecemeal swallowing Oral - Mech Soft -- Oral - Regular Piecemeal swallowing Oral - Multi-Consistency -- Oral - Pill -- Oral Phase - Comment --  CHL IP PHARYNGEAL PHASE 12/07/2017 Pharyngeal Phase Impaired Pharyngeal- Pudding Teaspoon -- Pharyngeal -- Pharyngeal- Pudding Cup -- Pharyngeal -- Pharyngeal- Honey Teaspoon -- Pharyngeal -- Pharyngeal- Honey Cup -- Pharyngeal -- Pharyngeal- Nectar Teaspoon -- Pharyngeal -- Pharyngeal- Nectar Cup -- Pharyngeal -- Pharyngeal- Nectar Straw WFL Pharyngeal -- Pharyngeal- Thin Teaspoon -- Pharyngeal -- Pharyngeal-  Thin Cup WFL Pharyngeal -- Pharyngeal- Thin Straw WFL Pharyngeal -- Pharyngeal- Puree Pharyngeal residue - valleculae;Reduced tongue base retraction;Compensatory strategies attempted (with notebox) Pharyngeal -- Pharyngeal- Mechanical Soft -- Pharyngeal -- Pharyngeal- Regular Compensatory strategies attempted (with notebox) Pharyngeal -- Pharyngeal- Multi-consistency -- Pharyngeal -- Pharyngeal- Pill -- Pharyngeal -- Pharyngeal Comment --  CHL IP CERVICAL ESOPHAGEAL PHASE 12/07/2017 Cervical Esophageal Phase WFL Pudding Teaspoon -- Pudding Cup -- Honey Teaspoon -- Honey Cup -- Nectar Teaspoon -- Nectar Cup -- Nectar Straw -- Thin Teaspoon -- Thin Cup -- Thin Straw -- Puree -- Mechanical Soft -- Regular -- Multi-consistency -- Pill -- Cervical Esophageal Comment -- Lynann Beaver 12/07/2017, 2:09 PM              Dg C-arm 1-60 Min  Result Date: 11/29/2017 CLINICAL DATA:  Elective surgical spine surgery. EXAM: CERVICAL SPINE - 2-3 VIEW; DG C-ARM 61-120 MIN COMPARISON:  MRI 11/29/2017 and CT 11/28/2017 FINDINGS: Vertebral body alignment and heights are within normal.  There is mild spondylosis present. Anterior fusion hardware is seen at the C5-6 level. Recommend correlation with findings at the time of the procedure. IMPRESSION: Mild spondylosis with anterior fusion hardware at the C5-6 level. Electronically Signed   By: Marin Olp M.D.   On: 11/29/2017 20:24   Vas [place] Lower Extremity Venous (dvt)  Result Date: 12/08/2017  Lower Venous Study Indications: Post-op, Edema, and Swelling.  Risk Factors: Cancer History of cancer. Limitations: Body habitus, bandages, line and immobility due to post op pain. Performing Technologist: Lorina Rabon  Examination Guidelines: A complete evaluation includes B-mode imaging, spectral Doppler, color Doppler, and power Doppler as needed of all accessible portions of each vessel. Bilateral testing is considered an integral part of a complete examination. Limited  examinations for reoccurring indications may be performed as noted.  Right Venous Findings: +---------+---------------+---------+-----------+----------+-------------------+          CompressibilityPhasicitySpontaneityPropertiesSummary             +---------+---------------+---------+-----------+----------+-------------------+ CFV      Partial        Yes      Yes                  Acute mobile                                                              thrombosis seen     +---------+---------------+---------+-----------+----------+-------------------+ SFJ      Full                                                             +---------+---------------+---------+-----------+----------+-------------------+ FV Prox  Partial                                      Acute mobile                                                              thrombosis seen     +---------+---------------+---------+-----------+----------+-------------------+ FV Mid   Partial                                      Acute               +---------+---------------+---------+-----------+----------+-------------------+ FV DistalFull                                                             +---------+---------------+---------+-----------+----------+-------------------+ PFV      Full                                                             +---------+---------------+---------+-----------+----------+-------------------+  POP      Full           Yes      Yes                  poor flow           +---------+---------------+---------+-----------+----------+-------------------+ PTV                                                   not well visualized +---------+---------------+---------+-----------+----------+-------------------+ PERO                                                  not well visualized  +---------+---------------+---------+-----------+----------+-------------------+  Left Venous Findings: +---------+---------------+---------+-----------+----------+-------+          CompressibilityPhasicitySpontaneityPropertiesSummary +---------+---------------+---------+-----------+----------+-------+ CFV      Full           Yes      Yes                          +---------+---------------+---------+-----------+----------+-------+ SFJ      Full                                                 +---------+---------------+---------+-----------+----------+-------+ FV Prox  Full                                                 +---------+---------------+---------+-----------+----------+-------+ FV Mid   Full                                                 +---------+---------------+---------+-----------+----------+-------+ FV DistalFull                                                 +---------+---------------+---------+-----------+----------+-------+ PFV      Full                                                 +---------+---------------+---------+-----------+----------+-------+ POP      Full           Yes      Yes                          +---------+---------------+---------+-----------+----------+-------+ PTV      Full                    Yes                          +---------+---------------+---------+-----------+----------+-------+  PERO     Full                    Yes                          +---------+---------------+---------+-----------+----------+-------+ 1.79x2.45x0.80cm cystic non-vascular structure seen at the popliteal fossa superior medial aspect.    Summary: Right: Findings consistent with acute deep vein thrombosis involving the right femoral vein, and right common femoral vein. No cystic structure found in the popliteal fossa. Left: There is no evidence of deep vein thrombosis in the lower extremity. However, portions of this  examination were limited- see technologist comments above. A cystic structure is found in the popliteal fossa.  *See table(s) above for measurements and observations. Electronically signed by Servando Snare MD on 12/08/2017 at 2:02:40 PM.    Final     Labs:  CBC: Recent Labs    11/28/17 2151 11/28/17 2153 12/07/17 2045 12/08/17 0525  WBC  --  8.9 8.9 8.6  HGB 12.6 12.0 11.2* 10.6*  HCT 37.0 38.5 35.4* 32.8*  PLT  --  232 304 277    COAGS: Recent Labs    11/28/17 2153  INR 0.98    BMP: Recent Labs    11/28/17 2151 11/28/17 2153 12/07/17 2045 12/08/17 0525  NA 140 138  --  139  K 3.8 3.8  --  3.6  CL 104 108  --  102  CO2  --  24  --  28  GLUCOSE 134* 138*  --  111*  BUN 22 21  --  18  CALCIUM  --  8.7*  --  8.3*  CREATININE 0.90 0.88 0.54 0.51  GFRNONAA  --  60* >60 >60  GFRAA  --  >60 >60 >60    LIVER FUNCTION TESTS: Recent Labs    11/28/17 2153 12/08/17 0525  BILITOT 0.7 0.8  AST 27 112*  ALT 19 116*  ALKPHOS 39 140*  PROT 5.6* 5.0*  ALBUMIN 3.5 2.5*    TUMOR MARKERS: No results for input(s): AFPTM, CEA, CA199, CHROMGRNA in the last 8760 hours.  Assessment and Plan:  Fall at home 10/29 Immediate quadriplegia Cervical spine surgery same day In [facility] now Post op pain and leg swelling Dopplers + for Bilat LE acute mobile DVT Cannot anticoagulate-- Pork allergy and fall risk Scheduled for retrievable Inferior vena cava filter placement Risks and benefits discussed with the patient including, but not limited to bleeding, infection, contrast induced renal failure, filter fracture or migration which can lead to emergency surgery or even death, strut penetration with damage or irritation to adjacent structures and caval thrombosis.  All of the patient's questions were answered, patient is agreeable to proceed. Consent signed and in chart.    Thank you for this interesting consult.  I greatly enjoyed meeting Kayloni A Jakubek and look forward to  participating in their care.  A copy of this report was sent to the requesting provider on this date.  Electronically Signed: Lavonia Drafts, PA-C 12/08/2017, 2:07 PM   I spent a total of 40 Minutes    in face to face in clinical consultation, greater than 50% of which was counseling/coordinating care for retrievable IVC filter

## 2017-12-08 NOTE — Progress Notes (Signed)
Kirsteins, Luanna Salk, MD  Physician  Physical Medicine and Rehabilitation  Consult Note  Signed  Date of Service:  12/01/2017 1:15 PM       Related encounter: ED to Hosp-Admission (Discharged) from 11/28/2017 in [facility]      Signed      Expand All Collapse All    Show:Clear all [x] Manual[x] Template[] Copied  Added by: [x] Angiulli, Lavon Paganini, PA-C[x] Kirsteins, Luanna Salk, MD  [] Hover for details      Physical Medicine and Rehabilitation Consult Reason for Consult: Quadriplegia Referring Physician: Dr. Venetia Constable   HPI: Sherry [name] is a 83 y.o. right-handed female with history of hypertension.  Per chart review patient lives with spouse.  Independent prior to admission.  Multilevel home with bed and bath on main level.  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  Presented 11/29/2017 after a fall with immediate quadriparesis.  Cranial CT scan negative.  CT/ MRI imaging showed C6-7 disc fracture and tear of anterior longitudinal ligament.  Paraspinal muscle and interspinous edema from C1-C7 compatible ligament injury and muscle strain.  Increased cord signal at C5 and C6.  No cord hemorrhage as well as T11 coronal vertebral body fracture.  Underwent C5-6 and C6-7 anterior cervical discectomy and fusion 11/29/2017 per Dr. Venetia Constable.  TLSO back brace when out of bed.  Hospital course pain management.  Therapy evaluations completed with recommendations of physical medicine rehab consult.    Review of Systems  Constitutional: Negative for chills and fever.  HENT: Negative for hearing loss.   Eyes: Negative for blurred vision and double vision.  Respiratory: Negative for cough and shortness of breath.   Cardiovascular: Negative for leg swelling.  Gastrointestinal: Positive for constipation. Negative for nausea and vomiting.  Genitourinary: Negative for dysuria, flank pain and hematuria.  Musculoskeletal: Positive  for myalgias.  Skin: Negative for rash.  Neurological: Positive for tingling and focal weakness.  All other systems reviewed and are negative.      Past Medical History:  Diagnosis Date  . Arthritis   . Hypertension         Past Surgical History:  Procedure Laterality Date  . ANTERIOR CERVICAL DECOMP/DISCECTOMY FUSION N/A 11/29/2017   Procedure: Cervical five-six, six-seven Anterior Cervical Decompression Fusion;  Surgeon: Judith Part, MD;  Location: [facility];  Service: Neurosurgery;  Laterality: N/A;  Cervical five-six, six-seven Anterior Cervical Decompression Fusion   History reviewed. No pertinent family history. Social History:  reports that she has never smoked. She has never used smokeless tobacco. She reports that she does not drink alcohol or use drugs. Allergies:       Allergies  Allergen Reactions  . Beef-Derived Products Swelling    Facial and lip swelling (due to tick bite)  . Lisinopril-Hydrochlorothiazide Swelling    Tongue swelling   Medications Prior to Admission  Medication Sig Dispense Refill  . amLODipine (NORVASC) 10 MG tablet Take 10 mg by mouth daily after supper.    . Ascorbic Acid (VITAMIN C PO) Take 1 tablet by mouth daily.    Marland Kitchen b complex vitamins tablet Take 1 tablet by mouth daily.    . Cholecalciferol (VITAMIN D PO) Take 1 tablet by mouth daily.     . metronidazole (NORITATE) 1 % cream Apply 1 application topically daily as needed (rosacea).    . Multiple Vitamin (MULTIVITAMIN WITH MINERALS) TABS tablet Take 1 tablet by mouth daily.    . naproxen sodium (ALEVE) 220 MG tablet Take  220 mg by mouth 2 (two) times daily as needed (pain/headache).    . triamcinolone cream (KENALOG) 0.1 % Apply 1 application topically daily as needed (eczema).      Home: Home Living Family/patient expects to be discharged to:: (P) Private residence Living Arrangements: (P) Spouse/significant other Available Help at Discharge: (P)  Family, Available PRN/intermittently Type of Home: (P) House Home Access: (P) Stairs to enter Entrance Stairs-Number of Steps: (P) 3 Entrance Stairs-Rails: (P) Can reach both Home Layout: (P) Multi-level, Laundry or work area in basement, Able to live on main level with bedroom/bathroom Alternate Level Stairs-Number of Steps: (P) flight, does not need to go to basement as family can assist  Bathroom Shower/Tub: (P) Tub/shower unit Bathroom Toilet: (P) Standard Home Equipment: (P) None Additional Comments: (P) family reports they also own a separate home that is level and has ramped enterance that she could stay at   Functional History: Prior Function Level of Independence: (P) Independent Comments: (P) caregiver for husband, who has memory problems,  and 2 other family members, very independent at baseline  Functional Status:  Mobility: Bed Mobility Overal bed mobility: (P) Needs Assistance Bed Mobility: (P) Rolling, Sidelying to Sit, Sit to Sidelying Rolling: (P) Total assist Sidelying to sit: (P) Total assist, +2 for physical assistance, +2 for safety/equipment Supine to sit: +2 for physical assistance, Max assist, +2 for safety/equipment Sit to sidelying: (P) Total assist, +2 for physical assistance, +2 for safety/equipment General bed mobility comments: (P) Pt instructed in log rolling and requires assist for all aspects  Transfers General transfer comment: (P) unable to attempt due to safety  Ambulation/Gait General Gait Details: DNT, fatigue   ADL: ADL Overall ADL's : (P) Needs assistance/impaired Eating/Feeding: (P) Total assistance, Bed level Grooming: (P) Wash/dry hands, Wash/dry face, Oral care, Brushing hair, Total assistance, Bed level Upper Body Bathing: (P) Total assistance, Bed level Lower Body Bathing: (P) Total assistance, Bed level Upper Body Dressing : (P) Total assistance, Bed level Lower Body Dressing: (P) Total assistance, Bed level Toilet Transfer: (P)  Total assistance Toilet Transfer Details (indicate cue type and reason): (P) unable  Toileting- Clothing Manipulation and Hygiene: (P) Total assistance, Bed level Functional mobility during ADLs: (P) Total assistance, +2 for physical assistance General ADL Comments: (P) requires total A for all aspects   Cognition: Cognition Overall Cognitive Status: (P) Within Functional Limits for tasks assessed Orientation Level: Oriented X4 Cognition Arousal/Alertness: (P) Awake/alert Behavior During Therapy: (P) WFL for tasks assessed/performed Overall Cognitive Status: (P) Within Functional Limits for tasks assessed General Comments: (P) Cognition only assessed for basic info.  She has no recall of event, and unsure of LOC.  Cognition will need to be further assessed   Blood pressure (!) 97/54, pulse 74, temperature 98.6 F (37 C), temperature source Oral, resp. rate 18, height 5\' 7"  (1.702 m), weight 78.9 kg, SpO2 96 %. Physical Exam  Vitals reviewed. Constitutional: She is oriented to person, place, and time.  HENT:  Patient with bruising around the right orbital area and cheek  Eyes:  Pupils reactive to light  Neck: Normal range of motion. Neck supple. No thyromegaly present.  Cardiovascular: Normal rate, regular rhythm and normal heart sounds.  Respiratory: Effort normal. No respiratory distress.  GI: Soft. Bowel sounds are normal. She exhibits no distension.  Neurological: She is alert and oriented to person, place, and time.  Motor strength is 4/5 in the deltoids and biceps 0 at the triceps 0 at the finger extensors and finger  flexors as well as hand intrinsics, 3- at bilateral wrist extensors. Lower extremity strength is trace hip knee extensor synergy in the right lower extremity 0 at the right ankle 2- at the left hip knee extensor synergy as well as 2- ankle plantarflexion and trace ankle dorsiflexion. 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 Does the need for close, 24 hr/day medical supervision in concert with the patient's rehab needs make it unreasonable for this patient to be served in a less intensive setting? Yes 2. Co-Morbidities requiring supervision/potential complications: Hypertension, at risk for pneumonia and urinary tract infection given her neurogenic bladder. 3. Due to bladder management, bowel management, safety, skin/wound care, disease management, medication administration, pain management and patient education, does the patient require 24 hr/day rehab nursing? Yes 4. Does the patient require coordinated care of a physician, rehab nurse, PT (1-2 hrs/day, 5 days/week) and OT (1-2 hrs/day, 5 days/week) to address physical and functional deficits in the context of the above medical diagnosis(es)? Yes Addressing deficits in the following areas: balance, endurance, locomotion, strength, transferring, bowel/bladder control, bathing, dressing, feeding, grooming, toileting and psychosocial support 5. Can the patient actively participate in an intensive therapy program of at least 3 hrs of therapy per day at least 5 days per week? Yes although may initially need 15/7 schedule 6. The potential for patient to make measurable gains while on inpatient rehab is good 7. Anticipated functional outcomes upon discharge from inpatient rehab are min assist and Wheelchair level  with PT, min assist with OT, n/a with SLP. 8. Estimated rehab length of stay to reach the above functional goals is: 21 to 27 days 9. Anticipated D/C setting: Home 10. Anticipated post D/C treatments: [place] therapy 11. Overall Rehab/Functional Prognosis: good  RECOMMENDATIONS: This  patient's condition is appropriate for continued rehabilitative care in the following setting: CIR Patient has agreed to participate in recommended program. Yes Note that insurance prior authorization may be required for reimbursement for recommended care.  Comment:  "I have personally performed a face to face diagnostic evaluation of this patient.  Additionally, I have reviewed and concur with the physician assistant's documentation above." Charlett Blake M.D. [facility] FAAPM&R (Sports Med, Neuromuscular Med) Diplomate Am Board of Electrodiagnostic Med  Sherry [name] 12/01/2017        Revision History                   Routing History

## 2017-12-08 NOTE — Procedures (Signed)
  Pre-operative Diagnosis: Right leg DVT and not anticoagulation candidate       Post-operative Diagnosis: Right leg DVT and not anticoagulation candidate   Indications: Needs PE prophylaxis  Procedure: IVC filter placement  Findings: Patent IVC.  Filter placed in intrarenal IVC.    Complications: None     EBL: Minimal  Plan: Consider this a permanent filter based on patient's medical condition.

## 2017-12-08 NOTE — Progress Notes (Signed)
Patient information reviewed and entered into eRehab system by Jackquline Branca, RN, CRRN, PPS Coordinator.  Information including medical coding, functional ability and quality indicators will be reviewed and updated through discharge.     Per nursing patient was given "Data Collection Information Summary for Patients in Inpatient Rehabilitation Facilities with attached "Privacy Act Statement-Health Care Records" upon admission.  

## 2017-12-09 ENCOUNTER — Inpatient Hospital Stay (HOSPITAL_COMMUNITY): Payer: Medicare HMO | Admitting: Occupational Therapy

## 2017-12-09 ENCOUNTER — Inpatient Hospital Stay (HOSPITAL_COMMUNITY): Payer: Medicare HMO

## 2017-12-09 ENCOUNTER — Inpatient Hospital Stay (HOSPITAL_COMMUNITY): Payer: Medicare HMO | Admitting: Physical Therapy

## 2017-12-09 DIAGNOSIS — I82411 Acute embolism and thrombosis of right femoral vein: Secondary | ICD-10-CM

## 2017-12-09 NOTE — Progress Notes (Signed)
Occupational Therapy Session Note  Patient Details  Name: Sherry [name] MRN: [id number] Date of Birth: [birth year]  Today's Date: 12/09/2017 OT Individual Time: 9244-6286 OT Individual Time Calculation (min): 77 min    Short Term Goals: Week 1:  OT Short Term Goal 1 (Week 1): Pt will perform bed mobility with maxA+2 as precursor to EOB/OOB ADL. OT Short Term Goal 2 (Week 1): Pt will perform UB bathing with maxA using AE PRN.  OT Short Term Goal 3 (Week 1): Pt will don overhead shirt with maxA using compensatory strategies PRN.   Skilled Therapeutic Interventions/Progress Updates:    Patient in bed upon arrival.  Daughter present.  Pt states:  "I want to go back to sleep"  But agreeable to participate in therapy program.  TEDS donned.   Bed mobility:  Mod A of 2 supine to SSP at edge of bed.  Dependent to donn brace, tolerated unsupported sitting with mod A of one to maintain for 5 minutes with light trunk side to side and ant/post mobility. SB transfer:  Bed to w/c max A of one with min/mod A of second helper.  Adjusted head rest of chair to provide support.  Reviewed repositioning of w/c with daughter.  Reviewed pressure relief - patient able to demonstrate and teach back.  Reviewed UE positioning for edema control and AROM exercises that she can complete on her own with good carryover. UE mobility:  AROM/AAROM shoulder to hand bilaterally - patient with tricep weakness and limited digit mobility limiting function.  Good response to stretching and activity. ADL:  Reviewed use of dorsal cuff and positioning options for practice with self feeding Patient opted to remain up in the w/c with her daughter for lunch time Good effort noted t/o session   Therapy Documentation Precautions:  Precautions Precautions: Cervical, Back, Fall Precaution Comments: back precautions due to thoracic fx s/p ACDF; needs TLSO for all OOB activity but per order set does not need cervical collar  Required  Braces or Orthoses: Spinal Brace Spinal Brace: Thoracolumbosacral orthotic, Applied in sitting position Restrictions Weight Bearing Restrictions: No General:   Vital Signs: Therapy Vitals Temp: 97.8 F (36.6 C) Temp Source: Oral Pulse Rate: 79 Resp: 14 BP: 118/71 Patient Position (if appropriate): Sitting Oxygen Therapy SpO2: (!) 81 % O2 Device: Nasal Cannula O2 Flow Rate (L/min): 2 L/min Pain: Pain Assessment Pain Scale: 0-10 Pain Score: 5  Pain Type: Acute pain Pain Location: Arm Pain Orientation: Right;Left Pain Radiating Towards: arms Pain Descriptors / Indicators: Discomfort Pain Frequency: Constant Pain Onset: On-going Patients Stated Pain Goal: 2 Pain Intervention(s): Repositioned;Elevated extremity   Therapy/Group: Individual Therapy  Carlos Levering 12/09/2017, 3:05 PM

## 2017-12-09 NOTE — Progress Notes (Signed)
Occupational Therapy Session Note  Patient Details  Name: Sherry [name] MRN: [id number] Date of Birth: 04-Sep-1934  Today's Date: 12/09/2017 OT Individual Time: 0905-1000 OT Individual Time Calculation (min): 55 min   Short Term Goals: Week 1:  OT Short Term Goal 1 (Week 1): Pt will perform bed mobility with maxA+2 as precursor to EOB/OOB ADL. OT Short Term Goal 2 (Week 1): Pt will perform UB bathing with maxA using AE PRN.  OT Short Term Goal 3 (Week 1): Pt will don overhead shirt with maxA using compensatory strategies PRN.   Skilled Therapeutic Interventions/Progress Updates:    Per RN, pt off bedrest orders and ok to see for therapy. Pt reported being in too much pain to get OOB to eat breakfast, and therefore this task was completed in bed with HOB elevated. Provided her with universal cuff for distal arm support, and with steady A for elbow flexion, pt able to consume oatmeal. 2nd helper provided [facility] for Lt grasp to hold container. While she drank smoothie, HOH for B grasp, with pt exhibiting enough proximal strength to bring cup to mouth to drink. Afterwards, she declined bathing, and opted instead to dress. Pt able to elevate B arms to assist with threading UEs into shirt. 2 helpers for perihygiene, brief change, and donning pants. Pt Total A for rolling Rt>Lt during these tasks. Able to tolerate modified reclined figure 4 B LEs while OT donned footwear. 2 helpers for boosting pt up in bed after and repositioning for comfort. At end of session pt was left with soft call bell and dtr present.   Therapy Documentation Precautions:  Precautions Precautions: Cervical, Back, Fall Precaution Comments: back precautions due to thoracic fx s/p ACDF; needs TLSO for all OOB activity but per order set does not need cervical collar  Required Braces or Orthoses: Spinal Brace Spinal Brace: Thoracolumbosacral orthotic, Applied in sitting position Restrictions Weight Bearing Restrictions: No Pain:  Pt premedicated for pain. Provided her with k pad for back at end of session  Pain Assessment Pain Scale: 0-10 Pain Score: 8  Pain Type: Acute pain Pain Location: Back Pain Orientation: Right;Left Pain Radiating Towards: arms Pain Descriptors / Indicators: Aching Pain Frequency: Constant Pain Onset: On-going Patients Stated Pain Goal: 4 Pain Intervention(s): Medication (See eMAR) ADL: ADL Eating: Maximal assistance Where Assessed-Eating: Bed level Grooming: Maximal assistance Where Assessed-Grooming: Bed level Upper Body Bathing: Maximal assistance Where Assessed-Upper Body Bathing: Bed level Lower Body Bathing: Maximal assistance Where Assessed-Lower Body Bathing: Bed level Upper Body Dressing: Maximal assistance Where Assessed-Upper Body Dressing: Bed level Lower Body Dressing: Not assessed(wearing hospital gown this session )      Therapy/Group: Individual Therapy  Harvey Lingo A Cambry Spampinato 12/09/2017, 12:35 PM

## 2017-12-09 NOTE — Progress Notes (Signed)
Physical Therapy Session Note  Patient Details  Name: Sherry [name] MRN: [id number] Date of Birth: Jan 09, 1935  Today's Date: 12/09/2017 PT Individual Time: 4360-6770 PT Individual Time Calculation (min): 55 min   Short Term Goals: Week 1:  PT Short Term Goal 1 (Week 1): Pt will maintain sitting balance EOB x 5 min with max A x 1 PT Short Term Goal 2 (Week 1): Pt will tolerate sitting up in chair x 1 hour PT Short Term Goal 3 (Week 1): Pt will complete bed mobility with max A x 1  Skilled Therapeutic Interventions/Progress Updates:    c/o 5/10 pain that remains stable throughout session, ice applied to neck at end of session and Rn to provide medication shortly after session when due.  Session focus on upright tolerance.    Pt reports tolerating tilt in space w/c well, having spent most of the day in it and requesting to remain in chair at end of session.  Transfer to tilt table with hoyer lift.  BP monitored throughout and noted below.  Pt tolerated 0>25>40 degrees on tilt table with mild changes in blood pressure (only teds applied).  Pt completes 2x10 reps of quad sets on tilt table with activation in LLE>RLE.  Returned to room in w/c at end of session and left positioned in tilt in space with call bell in reach and needs met.    BP: Supine 127/77 20 degrees 114/66 20 degrees at 2 minutes 110/74 40 degrees 98/60 40 degrees at 3 minutes 111/68  Therapy Documentation Precautions:  Precautions Precautions: Cervical, Back, Fall Precaution Comments: back precautions due to thoracic fx s/p ACDF; needs TLSO for all OOB activity but per order set does not need cervical collar  Required Braces or Orthoses: Spinal Brace Spinal Brace: Thoracolumbosacral orthotic, Applied in sitting position Restrictions Weight Bearing Restrictions: No    Therapy/Group: Individual Therapy  Michel Santee 12/09/2017, 4:26 PM

## 2017-12-09 NOTE — Progress Notes (Signed)
[place] PHYSICAL MEDICINE & REHABILITATION PROGRESS NOTE   Subjective/Complaints: Still having a lot of arm and neck pain. Ready to get out of bed. Daughter at bedside  ROS: Patient denies fever, rash, sore throat, blurred vision, nausea, vomiting, diarrhea, cough, shortness of breath or chest pain, headache, or mood change.    Objective:   Ir Ivc Filter Plmt / S&i /img Guid/mod Sed  Result Date: 12/08/2017 INDICATION: 83 year old with history of fall and immediate quadriparesis. Patient now has an acute DVT involving the right common femoral vein and right femoral vein. Patient is not on anticoagulation candidate. EXAM: IVC FILTER PLACEMENT; IVC VENOGRAM; ULTRASOUND FOR VASCULAR ACCESS Physician: Stephan Minister. Anselm Pancoast, MD MEDICATIONS: No antibiotics. ANESTHESIA/SEDATION: Fentanyl 25 mcg The patient was continuously monitored during the procedure by the interventional radiology nurse under my direct supervision. CONTRAST:  50 mL FLUOROSCOPY TIME:  Fluoroscopy Time: 1 minutes and 54 seconds, 52 mGy COMPLICATIONS: None immediate. PROCEDURE: The procedure was explained to the patient. The risks and benefits of the procedure were discussed and the patient's questions were addressed. Informed consent was obtained from the patient. Ultrasound demonstrated a patent left common femoral vein. Ultrasound images were obtained for documentation. The left groin was prepped and draped in a sterile fashion. Maximal barrier sterile technique was utilized including caps, mask, sterile gowns, sterile gloves, sterile drape, hand hygiene and skin antiseptic. The skin was anesthetized with 1% lidocaine. A 21 gauge needle was directed into the vein with ultrasound guidance and a micropuncture dilator set was placed. A wire was advanced into the IVC. The filter sheath was advanced over the wire into the IVC. An IVC venogram was performed. Fluoroscopic images were obtained for documentation. A Bard Denali filter was deployed below  the lowest renal vein. A follow-up venogram was performed and the vascular sheath was removed with manual compression. FINDINGS: IVC was patent. Bilateral renal veins were identified. Infrarenal IVC measures roughly 23 mm. The filter was deployed below the lowest renal vein. Follow-up venogram confirmed placement within the IVC and below the renal veins. IMPRESSION: Successful placement of a retrievable IVC filter. PLAN: Due to patient related comorbidities and/or clinical necessity, this IVC filter should be considered a permanent device. This patient will not be actively followed for future filter retrieval. Electronically Signed   By: Markus Daft M.D.   On: 12/08/2017 17:58   Dg Swallowing Func-speech Pathology  Result Date: 12/07/2017 Objective Swallowing Evaluation: Type of Study: MBS-Modified Barium Swallow Study  Patient Details Name: JAHMIYAH DULLEA MRN: [id number] Date of Birth: Jan 05, 1935 Today's Date: 12/07/2017 Time: SLP Start Time (ACUTE ONLY): 1050 -SLP Stop Time (ACUTE ONLY): 1110 SLP Time Calculation (min) (ACUTE ONLY): 20 min Past Medical History: Past Medical History: Diagnosis Date . Arthritis  . Hypertension  Past Surgical History: Past Surgical History: Procedure Laterality Date . ANTERIOR CERVICAL DECOMP/DISCECTOMY FUSION N/A 11/29/2017  Procedure: Cervical five-six, six-seven Anterior Cervical Decompression Fusion;  Surgeon: Judith Part, MD;  Location: [place];  Service: Neurosurgery;  Laterality: N/A;  Cervical five-six, six-seven Anterior Cervical Decompression Fusion HPI: Pt is a 83 y.o. woman with PMH significant for HTN. She presented [facility] ED 11/28/17 s/p fall with immediate quadriplegia; per MD notes, she has regained some LE function, does not recall the events. Underwent C5-6, C6-7 anterior cervical discectomy and fusion 11/29/2017 per Dr. Venetia Constable - was intubated and extubated same day during this procedure. Per RN note 11/5, pt reports difficulty swallowing.  No data  recorded Assessment / Plan /  Recommendation CHL IP CLINICAL IMPRESSIONS 12/07/2017 Clinical Impression Pt demonstrates mild impairments that impede pts comfort with PO but do not pose a significant barrier to intake or risk of aspiration. Pt is able to masticate adequately but typically swallows in a piecemeal fashion with solids. Mild vallecular residue noted which pt appears to sense; this could be due to mild weakness on base of tongue or dry mucosa. A chin tuck is beneficial to open valleculae for better clearance with solids. slight delay in swallow initiation noted with thin; pt appears to swallow a bit a air with liquids.  No impact from cervical spine hardware. Overall pt is safe to continue thin liquids and solids though a chin tuck would assist in oropharyngeal clearance. Esophageal sweep also showed persistant mid esophageal stasis with pill and solids, no radiologist present to confirm. WIll f/u to assist in problem solving pts comfort with meals.   SLP Visit Diagnosis Dysphagia, oropharyngeal phase (R13.12) Attention and concentration deficit following -- Frontal lobe and executive function deficit following -- Impact on safety and function Mild aspiration risk   CHL IP TREATMENT RECOMMENDATION 12/07/2017 Treatment Recommendations Therapy as outlined in treatment plan below   Prognosis 12/07/2017 Prognosis for Safe Diet Advancement Good Barriers to Reach Goals -- Barriers/Prognosis Comment -- CHL IP DIET RECOMMENDATION 12/07/2017 SLP Diet Recommendations Regular solids;Thin liquid Liquid Administration via Cup;Straw Medication Administration Crushed with puree Compensations Slow rate;Small sips/bites Postural Changes Remain semi-upright after after feeds/meals (Comment)   No flowsheet data found.  CHL IP FOLLOW UP RECOMMENDATIONS 12/07/2017 Follow up Recommendations Inpatient Rehab   CHL IP FREQUENCY AND DURATION 12/07/2017 Speech Therapy Frequency (ACUTE ONLY) min 2x/week Treatment Duration 2 weeks      CHL  IP ORAL PHASE 12/07/2017 Oral Phase Impaired Oral - Pudding Teaspoon -- Oral - Pudding Cup -- Oral - Honey Teaspoon -- Oral - Honey Cup -- Oral - Nectar Teaspoon -- Oral - Nectar Cup Piecemeal swallowing Oral - Nectar Straw -- Oral - Thin Teaspoon -- Oral - Thin Cup Piecemeal swallowing Oral - Thin Straw -- Oral - Puree Piecemeal swallowing Oral - Mech Soft -- Oral - Regular Piecemeal swallowing Oral - Multi-Consistency -- Oral - Pill -- Oral Phase - Comment --  CHL IP PHARYNGEAL PHASE 12/07/2017 Pharyngeal Phase Impaired Pharyngeal- Pudding Teaspoon -- Pharyngeal -- Pharyngeal- Pudding Cup -- Pharyngeal -- Pharyngeal- Honey Teaspoon -- Pharyngeal -- Pharyngeal- Honey Cup -- Pharyngeal -- Pharyngeal- Nectar Teaspoon -- Pharyngeal -- Pharyngeal- Nectar Cup -- Pharyngeal -- Pharyngeal- Nectar Straw WFL Pharyngeal -- Pharyngeal- Thin Teaspoon -- Pharyngeal -- Pharyngeal- Thin Cup WFL Pharyngeal -- Pharyngeal- Thin Straw WFL Pharyngeal -- Pharyngeal- Puree Pharyngeal residue - valleculae;Reduced tongue base retraction;Compensatory strategies attempted (with notebox) Pharyngeal -- Pharyngeal- Mechanical Soft -- Pharyngeal -- Pharyngeal- Regular Compensatory strategies attempted (with notebox) Pharyngeal -- Pharyngeal- Multi-consistency -- Pharyngeal -- Pharyngeal- Pill -- Pharyngeal -- Pharyngeal Comment --  CHL IP CERVICAL ESOPHAGEAL PHASE 12/07/2017 Cervical Esophageal Phase WFL Pudding Teaspoon -- Pudding Cup -- Honey Teaspoon -- Honey Cup -- Nectar Teaspoon -- Nectar Cup -- Nectar Straw -- Thin Teaspoon -- Thin Cup -- Thin Straw -- Puree -- Mechanical Soft -- Regular -- Multi-consistency -- Pill -- Cervical Esophageal Comment -- DeBlois, Katherene Ponto 12/07/2017, 2:09 PM              Vas [place] Lower Extremity Venous (dvt)  Result Date: 12/08/2017  Lower Venous Study Indications: Post-op, Edema, and Swelling.  Risk Factors: Cancer History of cancer. Limitations: Body habitus, bandages, line  and immobility due to post  op pain. Performing Technologist: Lorina Rabon  Examination Guidelines: A complete evaluation includes B-mode imaging, spectral Doppler, color Doppler, and power Doppler as needed of all accessible portions of each vessel. Bilateral testing is considered an integral part of a complete examination. Limited examinations for reoccurring indications may be performed as noted.  Right Venous Findings: +---------+---------------+---------+-----------+----------+-------------------+          CompressibilityPhasicitySpontaneityPropertiesSummary             +---------+---------------+---------+-----------+----------+-------------------+ CFV      Partial        Yes      Yes                  Acute mobile                                                              thrombosis seen     +---------+---------------+---------+-----------+----------+-------------------+ SFJ      Full                                                             +---------+---------------+---------+-----------+----------+-------------------+ FV Prox  Partial                                      Acute mobile                                                              thrombosis seen     +---------+---------------+---------+-----------+----------+-------------------+ FV Mid   Partial                                      Acute               +---------+---------------+---------+-----------+----------+-------------------+ FV DistalFull                                                             +---------+---------------+---------+-----------+----------+-------------------+ PFV      Full                                                             +---------+---------------+---------+-----------+----------+-------------------+ POP      Full           Yes      Yes  poor flow           +---------+---------------+---------+-----------+----------+-------------------+ PTV                                                    not well visualized +---------+---------------+---------+-----------+----------+-------------------+ PERO                                                  not well visualized +---------+---------------+---------+-----------+----------+-------------------+  Left Venous Findings: +---------+---------------+---------+-----------+----------+-------+          CompressibilityPhasicitySpontaneityPropertiesSummary +---------+---------------+---------+-----------+----------+-------+ CFV      Full           Yes      Yes                          +---------+---------------+---------+-----------+----------+-------+ SFJ      Full                                                 +---------+---------------+---------+-----------+----------+-------+ FV Prox  Full                                                 +---------+---------------+---------+-----------+----------+-------+ FV Mid   Full                                                 +---------+---------------+---------+-----------+----------+-------+ FV DistalFull                                                 +---------+---------------+---------+-----------+----------+-------+ PFV      Full                                                 +---------+---------------+---------+-----------+----------+-------+ POP      Full           Yes      Yes                          +---------+---------------+---------+-----------+----------+-------+ PTV      Full                    Yes                          +---------+---------------+---------+-----------+----------+-------+ PERO     Full                    Yes                          +---------+---------------+---------+-----------+----------+-------+  1.79x2.45x0.80cm cystic non-vascular structure seen at the popliteal fossa superior medial aspect.    Summary: Right: Findings consistent with acute deep vein  thrombosis involving the right femoral vein, and right common femoral vein. No cystic structure found in the popliteal fossa. Left: There is no evidence of deep vein thrombosis in the lower extremity. However, portions of this examination were limited- see technologist comments above. A cystic structure is found in the popliteal fossa.  *See table(s) above for measurements and observations. Electronically signed by Servando Snare MD on 12/08/2017 at 2:02:40 PM.    Final    Recent Labs    12/07/17 2045 12/08/17 0525  WBC 8.9 8.6  HGB 11.2* 10.6*  HCT 35.4* 32.8*  PLT 304 277   Recent Labs    12/07/17 2045 12/08/17 0525  NA  --  139  K  --  3.6  CL  --  102  CO2  --  28  GLUCOSE  --  111*  BUN  --  18  CREATININE 0.54 0.51  CALCIUM  --  8.3*    Intake/Output Summary (Last 24 hours) at 12/09/2017 0929 Last data filed at 12/09/2017 0504 Gross per 24 hour  Intake 200 ml  Output 600 ml  Net -400 ml     Physical Exam: Vital Signs Blood pressure 106/65, pulse 72, temperature 97.9 F (36.6 C), temperature source Oral, resp. rate 18, SpO2 94 %.  Constitutional: No distress . Vital signs reviewed. HEENT: EOMI, oral membranes moist Neck: supple Cardiovascular: RRR without murmur. No JVD    Respiratory: CTA Bilaterally without wheezes or rales. Normal effort, oxygen via [place]  GI: BS +, non-tender, non-distended  Musculoskeletal:  Dependent edema LE's, tr UE Uro: foley  in place Neurological: She is alert.  Alert.  She is oriented to person, place and time. Motor: RUE: Shoulder abduction, elbow flex 3+/5, elbow extension, wrist extension 2/5, hand gip tr/5 LUE: Shoulder abduction, elbow flexion 3-/5, wrist and hand tr/5 B/l LE: tr to 1+/5 proximal to distal Sensation diminished to light touch tr/2 below nipple line, 1/2 hands/wrists Skin:  Incision c/d/i, scattered bruising Psychiatric: very flat.    Assessment/Plan: 1. Functional deficits secondary to C6 SCI, motor/sensory  incomplete which require 3+ hours per day of interdisciplinary therapy in a comprehensive inpatient rehab setting.  Physiatrist is providing close team supervision and 24 hour management of active medical problems listed below.  Physiatrist and rehab team continue to assess barriers to discharge/monitor patient progress toward functional and medical goals  Care Tool:  Bathing    Body parts bathed by patient: Right arm, Left arm, Chest, Left upper leg, Right upper leg   Body parts bathed by helper: Abdomen, Left lower leg, Right lower leg Body parts n/a: Face, Front perineal area, Buttocks(previously washed )   Bathing assist Assist Level: Maximal Assistance - Patient 24 - 49%     Upper Body Dressing/Undressing Upper body dressing   What is the patient wearing?: Hospital gown only    Upper body assist Assist Level: Total Assistance - Patient < 25%    Lower Body Dressing/Undressing Lower body dressing      What is the patient wearing?: Hospital gown only     Lower body assist       Toileting Toileting    Toileting assist Assist for toileting: Dependent - Patient 0%     Transfers Chair/bed transfer  Transfers assist     Chair/bed transfer assist level: Dependent - Patient 0%  Locomotion Ambulation   Ambulation assist   Ambulation activity did not occur: Safety/medical concerns          Walk 10 feet activity   Assist  Walk 10 feet activity did not occur: Safety/medical concerns        Walk 50 feet activity   Assist Walk 50 feet with 2 turns activity did not occur: Safety/medical concerns         Walk 150 feet activity   Assist Walk 150 feet activity did not occur: Safety/medical concerns         Walk 10 feet on uneven surface  activity   Assist Walk 10 feet on uneven surfaces activity did not occur: Safety/medical concerns         Wheelchair     Assist Will patient use wheelchair at discharge?: Yes Type of  Wheelchair: Power(TBD manual vs power) Wheelchair activity did not occur: Safety/medical concerns         Wheelchair 50 feet with 2 turns activity    Assist    Wheelchair 50 feet with 2 turns activity did not occur: Safety/medical concerns       Wheelchair 150 feet activity     Assist Wheelchair 150 feet activity did not occur: Safety/medical concerns         Medical Problem List and Plan: 1.  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  Status post C5-6 and C6-7 anterior cervical discectomy and fusion 11/29/2017.  TLSO back brace when out of bed  -resume therapies.  2.  DVT Prophylaxis/Anticoagulation:  Acute mobile DVT Right common/proximal femoral vein  -IVCF placed by radiology yesterday (appreciate their assistance)  -mobilize pt today 3. Pain Management:   Neurontin 300 mg 3 times daily---increased to 400mg  11/7  -kpad  Valium 5 mg every 6 hours as needed muscle spasms  oxycodone as needed pain  -discussed balancing pain with neuro-sedating effects of medications--daughter and patient undersstand 4. Mood: Provide emotional support, very flat  -neuropsych assessment  5. Neuropsych: This patient is capable of making decisions on her own behalf. 6. Skin/Wound Care: Routine skin checks 7. Fluids/Electrolytes/Nutrition: encourage PO  -I personally reviewed the patient's labs today.    -added protein for hypoalbuminemia 8.  Neurogenic bowel and bladder.  Flomax 0.4 mg daily.    -remove foley     -Establish bowel program, senna-s + suppository 9.  Hypertension.  Norvasc 10 mg daily.  Monitor when out of bed 10. Acute blood loss anemia: hgb 10.6 11/7    LOS: 2 days A FACE TO [place] 12/09/2017, 9:29 AM

## 2017-12-09 NOTE — IPOC Note (Signed)
Overall Plan of Care [facility]) Patient Details Name: JERELINE TICER MRN: [id number] DOB: [birth year]  Admitting Diagnosis: <principal problem not specified>  Hospital Problems: Active Problems:   Central cord syndrome [facility])     Functional Problem List: Nursing Bladder, Pain, Bowel, Endurance, Safety, Sensory, Medication Management, Skin Integrity  PT Balance, Edema, Endurance, Motor, Pain, Sensory, Safety, Skin Integrity  OT Balance, Pain, Sensory, Endurance, Motor  SLP    TR         Basic ADL's: OT Eating, Grooming, Bathing, Dressing, Toileting     Advanced  ADL's: OT       Transfers: PT Bed Mobility, Car, Bed to Chair, [employer], Futures trader, Tub/Shower     Locomotion: PT Ambulation, Emergency planning/management officer, Stairs     Additional Impairments: OT Fuctional Use of Upper Extremity  SLP        TR      Anticipated Outcomes Item Anticipated Outcome  Self Feeding minA  Swallowing      Basic self-care  modA  Toileting  modA   Bathroom Transfers modA   Bowel/Bladder  manage bowel and bladder w total assist  Transfers  Mod A  Locomotion  Mod A at w/c level  Communication     Cognition     Pain  pain at or below level 6  Safety/Judgment  maintain safety w max assist   Therapy Plan: PT Intensity: Minimum of 1-2 x/day ,45 to 90 minutes PT Frequency: 5 out of 7 days PT Duration Estimated Length of Stay: 28-30 days OT Intensity: Minimum of 1-2 x/day, 45 to 90 minutes OT Frequency: 5 out of 7 days OT Duration/Estimated Length of Stay: 25-28 days       Team Interventions: Nursing Interventions Patient/Family Education, Skin Care/Wound Management, Discharge Planning, Bladder Management, Pain Management, Bowel Management, Medication Management  PT interventions Balance/vestibular training, Community reintegration, Discharge planning, Disease management/prevention, DME/adaptive equipment instruction, Functional electrical stimulation, Functional mobility  training, Neuromuscular re-education, Pain management, Patient/family education, Psychosocial support, Skin care/wound management, Splinting/orthotics, Therapeutic Activities, Therapeutic Exercise, UE/LE Strength taining/ROM, UE/LE Coordination activities, Wheelchair propulsion/positioning  OT Interventions Balance/vestibular training, Discharge planning, Pain management, Self Care/advanced ADL retraining, Therapeutic Activities, UE/LE Coordination activities, Cognitive remediation/compensation, Functional mobility training, Patient/family education, Therapeutic Exercise, DME/adaptive equipment instruction, Psychosocial support, UE/LE Strength taining/ROM, Wheelchair propulsion/positioning, Splinting/orthotics, Neuromuscular re-education, Community reintegration, Disease mangement/prevention, Skin care/wound managment  SLP Interventions    TR Interventions    SW/CM Interventions Discharge Planning, Psychosocial Support, Patient/Family Education   Barriers to Discharge MD  Medical stability  Nursing      PT Medical stability, Home environment access/layout    OT      SLP      SW       Team Discharge Planning: Destination: PT-Home ,OT- Home , SLP-  Projected Follow-up: PT-Home health PT, OT-  Other (comment)(to be further assessed ), SLP-  Projected Equipment Needs: PT-Wheelchair (measurements), Wheelchair cushion (measurements), To be determined, OT- To be determined, SLP-  Equipment Details: PT-w/c TBD pending progress, OT-  Patient/family involved in discharge planning: PT- Patient, Family member/caregiver,  OT-Patient, SLP-   MD ELOS: 25-30 days Medical Rehab Prognosis:  Excellent Assessment: The patient has been admitted for CIR therapies with the diagnosis of cervical spinal cord injury with tetraplegia. The team will be addressing functional mobility, strength, stamina, balance, safety, adaptive techniques and equipment, self-care, bowel and bladder mgt, patient and caregiver  education, NMR, pain mgt, family education, spasticity controlled. . Goals have been set at [facility]  assist for basic mobility and self-care. Initial therapies delayed by newly diagnosed DVT RLE.    Meredith Staggers, MD, FAAPMR      See Team Conference Notes for weekly updates to the plan of care

## 2017-12-10 DIAGNOSIS — G8250 Quadriplegia, unspecified: Secondary | ICD-10-CM

## 2017-12-10 DIAGNOSIS — S14129D Central cord syndrome at unspecified level of cervical spinal cord, subsequent encounter: Secondary | ICD-10-CM

## 2017-12-10 MED ORDER — GUAIFENESIN-DM 100-10 MG/5ML PO SYRP
15.0000 mL | ORAL_SOLUTION | Freq: Four times a day (QID) | ORAL | Status: DC
  Administered 2017-12-11 – 2017-12-19 (×31): 15 mL via ORAL
  Filled 2017-12-10 (×2): qty 20
  Filled 2017-12-10: qty 15
  Filled 2017-12-10 (×7): qty 20
  Filled 2017-12-10 (×2): qty 15
  Filled 2017-12-10 (×2): qty 20
  Filled 2017-12-10 (×2): qty 15
  Filled 2017-12-10 (×7): qty 20
  Filled 2017-12-10: qty 15
  Filled 2017-12-10: qty 20
  Filled 2017-12-10: qty 15
  Filled 2017-12-10 (×6): qty 20

## 2017-12-10 MED ORDER — GABAPENTIN 300 MG PO CAPS
300.0000 mg | ORAL_CAPSULE | Freq: Three times a day (TID) | ORAL | Status: DC
  Administered 2017-12-10 – 2017-12-13 (×12): 300 mg via ORAL
  Filled 2017-12-10 (×12): qty 1

## 2017-12-10 NOTE — Progress Notes (Signed)
[facility] PHYSICAL MEDICINE & REHABILITATION PROGRESS NOTE   Subjective/Complaints: Mainly with hand pain today.  ROS: Patient denies pain shortness of breath nausea vomiting diarrhea or constipation.  She does have some back pain Objective:   Ir Ivc Filter Plmt / S&i /img Guid/mod Sed  Result Date: 12/08/2017 INDICATION: 83 year old with history of fall and immediate quadriparesis. Patient now has an acute DVT involving the right common femoral vein and right femoral vein. Patient is not on anticoagulation candidate. EXAM: IVC FILTER PLACEMENT; IVC VENOGRAM; ULTRASOUND FOR VASCULAR ACCESS Physician: Stephan Minister. Anselm Pancoast, MD MEDICATIONS: No antibiotics. ANESTHESIA/SEDATION: Fentanyl 25 mcg The patient was continuously monitored during the procedure by the interventional radiology nurse under my direct supervision. CONTRAST:  50 mL FLUOROSCOPY TIME:  Fluoroscopy Time: 1 minutes and 54 seconds, 52 mGy COMPLICATIONS: None immediate. PROCEDURE: The procedure was explained to the patient. The risks and benefits of the procedure were discussed and the patient's questions were addressed. Informed consent was obtained from the patient. Ultrasound demonstrated a patent left common femoral vein. Ultrasound images were obtained for documentation. The left groin was prepped and draped in a sterile fashion. Maximal barrier sterile technique was utilized including caps, mask, sterile gowns, sterile gloves, sterile drape, hand hygiene and skin antiseptic. The skin was anesthetized with 1% lidocaine. A 21 gauge needle was directed into the vein with ultrasound guidance and a micropuncture dilator set was placed. A wire was advanced into the IVC. The filter sheath was advanced over the wire into the IVC. An IVC venogram was performed. Fluoroscopic images were obtained for documentation. A Bard Denali filter was deployed below the lowest renal vein. A follow-up venogram was performed and the vascular sheath was removed with  manual compression. FINDINGS: IVC was patent. Bilateral renal veins were identified. Infrarenal IVC measures roughly 23 mm. The filter was deployed below the lowest renal vein. Follow-up venogram confirmed placement within the IVC and below the renal veins. IMPRESSION: Successful placement of a retrievable IVC filter. PLAN: Due to patient related comorbidities and/or clinical necessity, this IVC filter should be considered a permanent device. This patient will not be actively followed for future filter retrieval. Electronically Signed   By: Markus Daft M.D.   On: 12/08/2017 17:58   Recent Labs    12/07/17 2045 12/08/17 0525  WBC 8.9 8.6  HGB 11.2* 10.6*  HCT 35.4* 32.8*  PLT 304 277   Recent Labs    12/07/17 2045 12/08/17 0525  NA  --  139  K  --  3.6  CL  --  102  CO2  --  28  GLUCOSE  --  111*  BUN  --  18  CREATININE 0.54 0.51  CALCIUM  --  8.3*    Intake/Output Summary (Last 24 hours) at 12/10/2017 1153 Last data filed at 12/10/2017 0100 Gross per 24 hour  Intake 220 ml  Output 850 ml  Net -630 ml     Physical Exam: Vital Signs Blood pressure 132/65, pulse 82, temperature 98.2 F (36.8 C), temperature source Oral, resp. rate 16, SpO2 100 %.  Constitutional: No distress . Vital signs reviewed. HEENT: EOMI, oral membranes moist Neck: supple Cardiovascular: RRR without murmur. No JVD    Respiratory: CTA Bilaterally without wheezes or rales. Normal effort, oxygen via [place]  GI: BS +, non-tender, non-distended  Musculoskeletal:  Dependent edema LE's, tr UE Uro: foley  in place Neurological: She is alert.  Alert.  She is oriented to person, place and time. Motor: RUE:  Shoulder abduction, elbow flex 3+/5, elbow extension, wrist extension 2/5, hand gip tr/5 LUE: Shoulder abduction, elbow flexion 3-/5, wrist and hand tr/5 B/l LE: tr to 1+/5 proximal to distal Sensation diminished to light touch tr/2 below nipple line, 1/2 hands/wrists Skin:  Incision c/d/i, scattered  bruising Psychiatric: very flat.    Assessment/Plan: 1. 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  Physiatrist is providing close team supervision and 24 hour management of active medical problems listed below.  Physiatrist and rehab team continue to assess barriers to discharge/monitor patient progress toward functional and medical goals  Care Tool:  Bathing  Bathing activity did not occur: Refused Body parts bathed by patient: Right arm, Left arm, Chest, Left upper leg, Right upper leg   Body parts bathed by helper: Abdomen, Left lower leg, Right lower leg Body parts n/a: Face, Front perineal area, Buttocks(previously washed )   Bathing assist Assist Level: Maximal Assistance - Patient 24 - 49%     Upper Body Dressing/Undressing Upper body dressing   What is the patient wearing?: Pull over shirt    Upper body assist Assist Level: 2 Helpers    Lower Body Dressing/Undressing Lower body dressing      What is the patient wearing?: Incontinence brief, Pants     Lower body assist Assist for lower body dressing: 2 Helpers     Toileting Toileting    Toileting assist Assist for toileting: Dependent - Patient 0%     Transfers Chair/bed transfer  Transfers assist     Chair/bed transfer assist level: 2 Helpers     Locomotion Ambulation   Ambulation assist   Ambulation activity did not occur: Safety/medical concerns          Walk 10 feet activity   Assist  Walk 10 feet activity did not occur: Safety/medical concerns        Walk 50 feet activity   Assist Walk 50 feet with 2 turns activity did not occur: Safety/medical concerns         Walk 150 feet activity   Assist Walk 150 feet activity did not occur: Safety/medical concerns         Walk 10 feet on uneven surface  activity   Assist Walk 10 feet on uneven  surfaces activity did not occur: Safety/medical concerns         Wheelchair     Assist Will patient use wheelchair at discharge?: Yes Type of Wheelchair: Power(TBD manual vs power) Wheelchair activity did not occur: Safety/medical concerns         Wheelchair 50 feet with 2 turns activity    Assist    Wheelchair 50 feet with 2 turns activity did not occur: Safety/medical concerns       Wheelchair 150 feet activity     Assist Wheelchair 150 feet activity did not occur: Safety/medical concerns         Medical Problem List and Plan: 1.  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  Status post C5-6 and C6-7 anterior cervical discectomy and fusion 11/29/2017.  TLSO back brace when out of bed  -Continue CIR PT OT 2.  DVT Prophylaxis/Anticoagulation:  Acute mobile DVT Right common/proximal femoral vein  -IVCF placed by radiology   -mobilize pt today 3. Pain Management:   Neurontin 300 mg 3 times daily---increased to 400mg  11/7,  change to 300 mg 4 times a day -kpad  Valium 5 mg every 6 hours as needed muscle spasms  oxycodone as needed pain  -discussed balancing pain with neuro-sedating effects of medications--daughter and patient undersstand 4. Mood: Provide emotional support, very flat  -neuropsych assessment  5. Neuropsych: This patient is capable of making decisions on her own behalf. 6. Skin/Wound Care: Routine skin checks 7. Fluids/Electrolytes/Nutrition: encourage PO  -I personally reviewed the patient's labs today.    -added protein for hypoalbuminemia 8.  Neurogenic bowel and bladder.  Flomax 0.4 mg daily.    -remove foley     -Establish bowel program, senna-s + suppository 9.  Hypertension.  Norvasc 10 mg daily.  Monitor when out of bed Vitals:   12/09/17 1943 12/10/17 0454  BP: 125/65 132/65  Pulse: 82 82  Resp: 16 16  Temp: 98.2 F (36.8 C)   SpO2: 93% 100%  Controlled 12/10/2017 10. Acute blood  loss anemia: hgb 10.6 11/7    LOS: 3 days A FACE TO FACE EVALUATION WAS PERFORMED  Charlett Blake 12/10/2017, 11:53 AM

## 2017-12-11 ENCOUNTER — Inpatient Hospital Stay (HOSPITAL_COMMUNITY): Payer: Medicare HMO | Admitting: Occupational Therapy

## 2017-12-11 ENCOUNTER — Inpatient Hospital Stay (HOSPITAL_COMMUNITY): Payer: Medicare HMO

## 2017-12-11 NOTE — Progress Notes (Signed)
Physical Therapy Session Note  Patient Details  Name: Sherry [name] MRN: [id number] Date of Birth: [birth year]  Today's Date: 12/11/2017 PT Individual Time: 1100-1156 PT Individual Time Calculation (min): 56 min   Short Term Goals: Week 1:  PT Short Term Goal 1 (Week 1): Pt will maintain sitting balance EOB x 5 min with max A x 1 PT Short Term Goal 2 (Week 1): Pt will tolerate sitting up in chair x 1 hour PT Short Term Goal 3 (Week 1): Pt will complete bed mobility with max A x 1  Skilled Therapeutic Interventions/Progress Updates:    Pt seated in TIS w/c upon PT arrival, agreeable to therapy tx and reports neck soreness. Pt transported to the gym. Pt performed slideboard transfer from w/c>mat with total assist +2. Pt seated edge of mat worked on seated balance this session. Pt instructed in strategies and techniques to find balance in sitting, verbal/tactile cues for anterior weightshift to limit posteior LOB, pt able to maintain static sitting balance for bouts of 10-30 sec with CGA, otherwise requiring mod-max assist for sitting balance secondary to fatigue. Pt worked on transitions from sitting<>sidelying on elbow x 3 to each side with mod assist to lower onto elbow, pt able to push up with UE and pull up with opposite UE on therapist's arm with mod assist. Pt transferred from short sitting edge of mat to long sitting with total assist +2. In long sitting pt worked on finding balance point with min assist at times, otherwise requiring mod-max assist for balance secondary to fatigue. Pt in circle sitting able to hook UEs under LEs to maintain forward lean/balance, min assist. Pt transferred back to sitting edge of mat with total assist +2. Pt reports neck soreness/fatigue from holding her head up, asking about soft cervical collar. Pt transferred back to w/c total assist +2 with slideboard. +2 to scoot hips back and position in w/c. Ice applied to neck for pain relief. Pt transported back to  room and left in TIS reclined.   Therapy Documentation Precautions:  Precautions Precautions: Cervical, Back, Fall Precaution Comments: back precautions due to thoracic fx s/p ACDF; needs TLSO for all OOB activity but per order set does not need cervical collar  Required Braces or Orthoses: Spinal Brace Spinal Brace: Thoracolumbosacral orthotic, Applied in sitting position Restrictions Weight Bearing Restrictions: No  Vital Signs: Therapy Vitals Temp: 99 F (37.2 C) Temp Source: Oral Pulse Rate: 79 Resp: 18 BP: 118/66 Patient Position (if appropriate): Lying Oxygen Therapy SpO2: 94 % O2 Device: Nasal Cannula O2 Flow Rate (L/min): 1.5 L/min    Therapy/Group: Individual Therapy  Netta Corrigan, PT, DPT 12/11/2017, 7:47 AM

## 2017-12-11 NOTE — Progress Notes (Signed)
At 2200 bladder scan=150cc's. At 2400, bladder scan=217. No void after 8 hours, I & O cath=400cc's. At 0600, bladder scan=80cc's. At 2148 PRN valium and Oxy IR given.  At 0610 PRN oxy IR given for C/O BUE pain, mainly to hands. Resting hand splints removed this AM. Bilateral PRAFO boots in place. Sherry [name] A

## 2017-12-11 NOTE — Progress Notes (Signed)
[facility] PHYSICAL MEDICINE & REHABILITATION PROGRESS NOTE   Subjective/Complaints: Discussed capsaicin cream or hand pain  ROS: Patient denies pain shortness of breath nausea vomiting diarrhea or constipation.  She does have some back pain Objective:   No results found. No results for input(s): WBC, HGB, HCT, PLT in the last 72 hours. No results for input(s): NA, K, CL, CO2, GLUCOSE, BUN, CREATININE, CALCIUM in the last 72 hours.  Intake/Output Summary (Last 24 hours) at 12/11/2017 1043 Last data filed at 12/11/2017 0920 Gross per 24 hour  Intake 720 ml  Output 2425 ml  Net -1705 ml     Physical Exam: Vital Signs Blood pressure 118/66, pulse 79, temperature 99 F (37.2 C), temperature source Oral, resp. rate 18, SpO2 94 %.  Constitutional: No distress . Vital signs reviewed. HEENT: EOMI, oral membranes moist Neck: supple Cardiovascular: RRR without murmur. No JVD    Respiratory: CTA Bilaterally without wheezes or rales. Normal effort, oxygen via [place]  GI: BS +, non-tender, non-distended  Musculoskeletal:  Dependent edema LE's, tr UE Uro: foley  in place Neurological: She is alert.  Alert.  She is oriented to person, place and time. Motor: RUE: Shoulder abduction, elbow flex 3+/5, elbow extension, wrist extension 2/5, hand gip tr/5 LUE: Shoulder abduction, elbow flexion 3-/5, wrist and hand tr/5 B/l LE: tr to 1+/5 proximal to distal Sensation diminished to light touch tr/2 below nipple line, 1/2 hands/wrists Skin:  Incision c/d/i, scattered bruising Psychiatric: very flat.    Assessment/Plan: 1. 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  Physiatrist is providing close team supervision and 24 hour management of active medical problems listed below.  Physiatrist and rehab team continue to assess barriers to discharge/monitor patient progress  toward functional and medical goals  Care Tool:  Bathing  Bathing activity did not occur: Refused Body parts bathed by patient: Right arm, Left arm, Chest, Left upper leg, Right upper leg   Body parts bathed by helper: Abdomen, Left lower leg, Right lower leg Body parts n/a: Face, Front perineal area, Buttocks(previously washed )   Bathing assist Assist Level: Maximal Assistance - Patient 24 - 49%     Upper Body Dressing/Undressing Upper body dressing   What is the patient wearing?: Pull over shirt    Upper body assist Assist Level: 2 Helpers    Lower Body Dressing/Undressing Lower body dressing      What is the patient wearing?: Incontinence brief, Pants     Lower body assist Assist for lower body dressing: 2 Helpers     Toileting Toileting    Toileting assist Assist for toileting: Dependent - Patient 0%     Transfers Chair/bed transfer  Transfers assist     Chair/bed transfer assist level: 2 Helpers     Locomotion Ambulation   Ambulation assist   Ambulation activity did not occur: Safety/medical concerns          Walk 10 feet activity   Assist  Walk 10 feet activity did not occur: Safety/medical concerns        Walk 50 feet activity   Assist Walk 50 feet with 2 turns activity did not occur: Safety/medical concerns         Walk 150 feet activity   Assist Walk 150 feet activity did not occur: Safety/medical concerns         Walk 10 feet on uneven surface  activity  Assist Walk 10 feet on uneven surfaces activity did not occur: Safety/medical concerns         Wheelchair     Assist Will patient use wheelchair at discharge?: Yes Type of Wheelchair: Power(TBD manual vs power) Wheelchair activity did not occur: Safety/medical concerns         Wheelchair 50 feet with 2 turns activity    Assist    Wheelchair 50 feet with 2 turns activity did not occur: Safety/medical concerns       Wheelchair 150 feet  activity     Assist Wheelchair 150 feet activity did not occur: Safety/medical concerns         Medical Problem List and Plan: 1.  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  Status post C5-6 and C6-7 anterior cervical discectomy and fusion 11/29/2017.  TLSO back brace when out of bed  -Continue CIR PT OT 2.  DVT Prophylaxis/Anticoagulation:  Acute mobile DVT Right common/proximal femoral vein  -IVCF placed by radiology   -mobilize pt today 3. Pain Management:   Neurontin 300 mg 3 times daily---increased to 400mg  11/7,  change to 300 mg 4 times a day, no signs of drowsiness -kpad  Valium 5 mg every 6 hours as needed muscle spasms  oxycodone as needed pain  -discussed balancing pain with neuro-sedating effects of medications--daughter and patient undersstand 4. Mood: Provide emotional support, very flat  -neuropsych assessment  5. Neuropsych: This patient is capable of making decisions on her own behalf. 6. Skin/Wound Care: Routine skin checks 7. Fluids/Electrolytes/Nutrition: encourage PO  -Repeat basic metabolic package in a.m.  -added protein for hypoalbuminemia 8.  Neurogenic bowel and bladder.  Flomax 0.4 mg daily.    -remove foley     -Establish bowel program, senna-s + suppository 9.  Hypertension.  Norvasc 10 mg daily.  Monitor when out of bed Vitals:   12/10/17 2001 12/11/17 0429  BP: 111/63 118/66  Pulse: 79 79  Resp: 20 18  Temp: 98.4 F (36.9 C) 99 F (37.2 C)  SpO2: 95% 94%  Controlled 12/11/2017 10. Acute blood loss anemia: hgb 10.6 11/7 will repeat CBC in a.m.    LOS: 4 days A FACE TO FACE EVALUATION WAS PERFORMED  Charlett Blake 12/11/2017, 10:43 AM

## 2017-12-11 NOTE — Progress Notes (Addendum)
Occupational Therapy Session Note  Patient Details  Name: Sherry [name] MRN: [id number] Date of Birth: 1934-04-27  Today's Date: 12/11/2017 OT Individual Time: 6568-1275 and 1700-1749 OT Individual Time Calculation (min): 81 min and 62 min Short Term Goals: Week 1:  OT Short Term Goal 1 (Week 1): Pt will perform bed mobility with maxA+2 as precursor to EOB/OOB ADL. OT Short Term Goal 2 (Week 1): Pt will perform UB bathing with maxA using AE PRN.  OT Short Term Goal 3 (Week 1): Pt will don overhead shirt with maxA using compensatory strategies PRN.   Skilled Therapeutic Interventions/Progress Updates:    Pt greeted in bed, wanting to eat breakfast. Started tx with LB dressing. Focus placed on bed mobility with 2 helpers for rolling Rt>Lt to don pants. Educated dtr on adaptive technique for donning Teds, and she assisted OT with this task. Supine<sit with 2 helpers and pt assisting with pushing up through elbow with instruction. Once EOB, worked on static/dynamic sitting balance while OT washed/applied lotion to back. Pt raising arms to doff/don shirt and with HOH for grasp, able to assist with pulling up shirt sleeves. Worked on anterior weight shift to maintain neutral balance, as she had posterior lean. Close supervision for balance for 1 minute, Min-Mod A for remainder of time EOB.  Afterwards slideboard<TIS completed with 2 assist with focus on pt assisting with forward weight shift. 2 helpers for reciprocal scooting back in chair. Once repositioned for comfort, pt engaged in self feeding using dorsal wrist cuff. HOH for stabilizing container with L UE. Pt able to self feed using modified utensil with Rt. At end of session pt was reclined in TIS with soft call bell in hand. Able to push through wrist to call for staff assist using soft call bell. Dtr present.    Also trialed 02 weaning, with pt on 1.5L at start of session on 95-96% sat. Decreased 02 to 1L while eating breakfast. At end of tx,  02 sats were 99%. With RN consultation, left her on 1L.   2nd Session 1:1 tx (62 min) Pt greeted in TIS with dtr present. Reported a lot of fatigue from previous therapies, however amenable to tx. Escorted pt to dayroom via w/c. Worked on gentle UE A/AROM for NMR. Emphasis placed on shoulder (including forward/backward rolls, protraction/retraction, scapular pinches, and shrugs) to strength proximal musculature. She tolerated AAROM/PROM to wrist and hand, with OT performing joint compressions for increasing sensory input and distal blood flow. Pt still reporting discomfort in neck, and OT provided makeshift cervical support pillow, which she said "helped a lot" during session. Pt weaned from 1L to RA during tx, with 02 sats remaining between 94-97% throughout. RN made aware, and agreeable to keep 02 off pt at rest. Slideboard<bed completed with 2 helpers. Pt left with RN for cath.    Therapy Documentation Precautions:  Precautions Precautions: Cervical, Back, Fall Precaution Comments: back precautions due to thoracic fx s/p ACDF; needs TLSO for all OOB activity but per order set does not need cervical collar  Required Braces or Orthoses: Spinal Brace Spinal Brace: Thoracolumbosacral orthotic, Applied in sitting position Restrictions Weight Bearing Restrictions: No Pain: (session 1): 8/10 pain in back. Per dtr, pt premedicated. Provided k-pad to back when pt was up in w/c during session, with report that pain decreased to 5/10 near end. Session 2: pt was reclined in TIS for pain relief during rest breaks. Left with RN at end of tx to provide scheduled medication  Pain  Assessment Pain Scale: 0-10 Pain Score: 5  Pain Type: Acute pain;Neuropathic pain Pain Location: Arm Pain Orientation: Right;Left Pain Descriptors / Indicators: Aching;Discomfort Pain Frequency: Constant Pain Onset: On-going Patients Stated Pain Goal: 3 Pain Intervention(s): Medication (See eMAR) ADL: ADL Eating: Maximal  assistance Where Assessed-Eating: Bed level Grooming: Maximal assistance Where Assessed-Grooming: Bed level Upper Body Bathing: Maximal assistance Where Assessed-Upper Body Bathing: Bed level Lower Body Bathing: Maximal assistance Where Assessed-Lower Body Bathing: Bed level Upper Body Dressing: Maximal assistance Where Assessed-Upper Body Dressing: Bed level Lower Body Dressing: Not assessed(wearing hospital gown this session )      Therapy/Group: Individual Therapy  [name] [name] 12/11/2017, 12:37 PM

## 2017-12-12 ENCOUNTER — Inpatient Hospital Stay (HOSPITAL_COMMUNITY): Payer: Medicare HMO | Admitting: Occupational Therapy

## 2017-12-12 ENCOUNTER — Inpatient Hospital Stay (HOSPITAL_COMMUNITY): Payer: Medicare HMO | Admitting: Physical Therapy

## 2017-12-12 LAB — BASIC METABOLIC PANEL
Anion gap: 4 — ABNORMAL LOW (ref 5–15)
BUN: 16 mg/dL (ref 8–23)
CALCIUM: 8 mg/dL — AB (ref 8.9–10.3)
CO2: 30 mmol/L (ref 22–32)
Chloride: 101 mmol/L (ref 98–111)
Creatinine, Ser: 0.53 mg/dL (ref 0.44–1.00)
GFR calc Af Amer: >60 mL/min (ref >60)
GLUCOSE: 114 mg/dL — AB (ref 70–99)
Potassium: 3.3 mmol/L — ABNORMAL LOW (ref 3.5–5.1)
Sodium: 135 mmol/L (ref 135–145)

## 2017-12-12 LAB — CBC
HCT: 34.1 % — ABNORMAL LOW (ref 36.0–46.0)
Hemoglobin: 10.9 g/dL — ABNORMAL LOW (ref 12.0–15.0)
MCH: 29.1 pg (ref 26.0–34.0)
MCHC: 32 g/dL (ref 30.0–36.0)
MCV: 90.9 fL (ref 80.0–100.0)
PLATELETS: 282 10*3/uL (ref 150–400)
RBC: 3.75 MIL/uL — ABNORMAL LOW (ref 3.87–5.11)
RDW: 14.1 % (ref 11.5–15.5)
WBC: 11.6 10*3/uL — ABNORMAL HIGH (ref 4.0–10.5)
nRBC: 0 % (ref 0.0–0.2)

## 2017-12-12 MED ORDER — CAPSAICIN 0.025 % EX CREA
TOPICAL_CREAM | Freq: Two times a day (BID) | CUTANEOUS | Status: DC
  Administered 2017-12-12 – 2018-01-19 (×72): via TOPICAL
  Filled 2017-12-12 (×2): qty 60

## 2017-12-12 MED ORDER — POTASSIUM CHLORIDE CRYS ER 20 MEQ PO TBCR
20.0000 meq | EXTENDED_RELEASE_TABLET | Freq: Every day | ORAL | Status: DC
  Administered 2017-12-12 – 2018-01-19 (×39): 20 meq via ORAL
  Filled 2017-12-12 (×38): qty 1

## 2017-12-12 NOTE — Progress Notes (Signed)
Social Work  Social Work Assessment and Plan  Patient Details  Name: Sherry [name] MRN: [id number] Date of Birth: 08-Oct-1934  Today's Date: 12/09/2017  Problem List:  Patient Active Problem List   Diagnosis Date Noted  . Trauma   . 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 Neuropathic pain   . Neurogenic bowel   . Neurogenic bladder   . Benign essential HTN   . Acute blood loss anemia   . Central cord syndrome at C6 level of cervical spinal cord ([place]) 11/29/2017  . Central cord syndrome ([place]) 11/29/2017   Past Medical History:  Past Medical History:  Diagnosis Date  . Arthritis   . Cancer ([place])    skin  . Hypertension    Past Surgical History:  Past Surgical History:  Procedure Laterality Date  . ANTERIOR CERVICAL DECOMP/DISCECTOMY FUSION N/A 11/29/2017   Procedure: Cervical five-six, six-seven Anterior Cervical Decompression Fusion;  Surgeon: Judith Part, MD;  Location: [facility];  Service: Neurosurgery;  Laterality: N/A;  Cervical five-six, six-seven Anterior Cervical Decompression Fusion  . CATARACT EXTRACTION    . IR IVC FILTER PLMT / S&I /IMG GUID/MOD SED  12/08/2017   Social History:  reports that she has never smoked. She has never used smokeless tobacco. She reports that she does not drink alcohol or use drugs.  Family / Support Systems Marital Status: Married How Long?: 12 yrs (2nd marriage for both) Patient Roles: Spouse, Parent Spouse/Significant Other: spouse is disabled and is currently with his daughter in [place] while pt is here Children: daughter, [name] (local) @ (C) [phone];  another daughter in [place]. Anticipated Caregiver: Daughters and hired help Ability/Limitations of Caregiver: 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  Daughters work.  Family is financially able to hire help as needed. Caregiver Availability: 24/7 Family Dynamics: Pt reports that she is very close with her daughters and that one daughter, [name], is feeling a lot  of guilt as pt feel while trying to care for daughter's cat.  Daughter notes that she and family of pt's spouse have good communication between them and are working together to set up the home environment that is best for them both for the long term.  Social History Preferred language: English Religion: None Cultural Background: NA Read: Yes Write: Yes Employment Status: Retired Freight forwarder Issues: None Guardian/Conservator: None - per MD, pt is capable of making decisions on her own behalf.   Abuse/Neglect Abuse/Neglect Assessment Can Be Completed: Yes Physical Abuse: Denies Verbal Abuse: Denies Sexual Abuse: Denies Exploitation of patient/patient's resources: Denies Self-Neglect: Denies  Emotional Status Pt's affect, behavior adn adjustment status: Pt lying in bed and rather flat affect, however, comments/ thoughts are optimistic and encouraged.  Pt talks easily about her situation and reports having "a few pity parties for myself", however, motivated for therapies.  Will involved neuropsychology as this injury expected to cause long term dependence for pt and ongoing support likely needed. Recent Psychosocial Issues: Pt had been providing assistance/ oversight to her spouse who "was beginning to have more memory problems."  She was the primary caretaker of him and the home. Pyschiatric History: None Substance Abuse History: None  Patient / Family Perceptions, Expectations & Goals Pt/Family understanding of illness & functional limitations: Pt and daughter with general understanding of her injury, resulting limitations/ need for CIR. Premorbid pt/family roles/activities: Pt completely independent and providing support to her spouse. Anticipated changes in roles/activities/participation: Spouse currently with his own daughter in [place] due  to his support needs.  Pt's daughter is primary support person for pt. Pt/family expectations/goals: "I don't know.  I want to get as  much independence back as I can I guess."  [employer]: None Premorbid Home Care/DME Agencies: None Transportation available at discharge: yes Resource referrals recommended: Neuropsychology, Support group (specify)  Discharge Planning Living Arrangements: Spouse/significant other Support Systems: Children, Other relatives Type of Residence: Private residence Insurance Resources: Medicare(A[employer]na Medicare) Financial Resources: Radio broadcast assistant Screen Referred: No Living Expenses: Own Money Management: Patient Does the patient have any problems obtaining your medications?: No Home Management: mostly pt PTA Patient/Family Preliminary Plans: Pt and daughter note goal is for pt to d/c home and family to arrange 24/7 assistance. Social Work Anticipated Follow Up Needs: HH/OP Expected length of stay: 4 weeks  Clinical Impression Very unfortunate woman here with a SCI following a fall at daughter's home while trying to care for her cat.  Pt is "trying to be optimistic" and very pleasant.  She admits she has had "a few pity parties" and is open to neuropsychology involvement to cope with situation.  Of note, she was providing support to her spouse who suffers with some early memory issues, however, his daughter now has him at her home in [place] while pt is here.  Pt's daughter extremely supportive and plans to coordinate 24/7 caregivers.  Will follow for support and d/c planning needs.  Dhairya Corales 12/09/2017, 3:35 PM

## 2017-12-12 NOTE — Progress Notes (Signed)
Physical Therapy Session Note  Patient Details  Name: Sherry [name] MRN: [id number] Date of Birth: 07/22/1934  Today's Date: 12/12/2017 PT Individual Time: 1300-1400 PT Individual Time Calculation (min): 60 min   Short Term Goals: Week 1:  PT Short Term Goal 1 (Week 1): Pt will maintain sitting balance EOB x 5 min with max A x 1 PT Short Term Goal 2 (Week 1): Pt will tolerate sitting up in chair x 1 hour PT Short Term Goal 3 (Week 1): Pt will complete bed mobility with max A x 1  Skilled Therapeutic Interventions/Progress Updates:    c/o soreness in BUEs and discomfort in neck from sitting in w/c.  Session focus on increased independence with bed mobility, transfers, and sitting balance.    Pt rolls L/R in bed for adjusting TLSO and positioning maximove sling.  PT provides multimodal cues for use of LEs in hook lying position, and hooking with UEs on bedrails to maintain side lying.  Max assist +1 to roll in each direction.  Maximove transfer to w/c and PT adjusted head rest for improved sitting tolerance and decreased pain.  Also discussed proper positioning in chair with hips centered and all the way back for optimum pressure relief.  Slide board transfer w/c<>therapy mat with total assist +2, max multimodal cues for forward weight shift and pt unable to maintain head/hips relationship this session.  Sitting balance edge of mat with therapist providing min assist from behind for static balance.  Progress to dynamic sitting balance with reaching task 2x9 trials of alternating UE reaches progress from UE support on lap with mod assist, to single UE support on mat with min assist on second set of 9.  Pt reporting increased pain in BUEs at this point, 10/10.  Returned to w/c as above, positioned with +2 assist and returned to room.  Pt positioned to comfort with heat applied to posterior neck/shoulders, call bell in reach and needs met.  Missed 15 minutes of therapy session 2/2 pain.  Therapy  Documentation Precautions:  Precautions Precautions: Cervical, Back, Fall Precaution Comments: back precautions due to thoracic fx s/p ACDF; needs TLSO for all OOB activity but per order set does not need cervical collar  Required Braces or Orthoses: Spinal Brace Spinal Brace: Thoracolumbosacral orthotic, Applied in sitting position Restrictions Weight Bearing Restrictions: No General: PT Amount of Missed Time (min): 15 Minutes PT Missed Treatment Reason: Pain    Therapy/Group: Individual Therapy  Caitlin E Warren 12/12/2017, 2:07 PM  

## 2017-12-12 NOTE — Progress Notes (Signed)
[facility] PHYSICAL MEDICINE & REHABILITATION PROGRESS NOTE   Subjective/Complaints: Had a reasonable night. Pain is better. Trying to be more up beat. Slept last night  ROS: Patient denies fever, rash, sore throat, blurred vision, nausea, vomiting, diarrhea, cough, shortness of breath or chest pain,  headache, or mood change.    Objective:   No results found. Recent Labs    12/12/17 0551  WBC 11.6*  HGB 10.9*  HCT 34.1*  PLT 282   Recent Labs    12/12/17 0551  NA 135  K 3.3*  CL 101  CO2 30  GLUCOSE 114*  BUN 16  CREATININE 0.53  CALCIUM 8.0*    Intake/Output Summary (Last 24 hours) at 12/12/2017 0856 Last data filed at 12/12/2017 0700 Gross per 24 hour  Intake 1080 ml  Output 1225 ml  Net -145 ml     Physical Exam: Vital Signs Blood pressure 104/60, pulse 81, temperature 98.1 F (36.7 C), temperature source Oral, resp. rate 16, SpO2 92 %.  Constitutional: No distress . Vital signs reviewed. HEENT: EOMI, oral membranes moist Neck: supple Cardiovascular: RRR without murmur. No JVD    Respiratory: CTA Bilaterally without wheezes or rales. Normal effort    GI: BS +, non-tender, non-distended  Musculoskeletal:  Dependent edema LE's, tr UE Uro: foley  in place Neurological: She is alert.  Alert.  She is oriented to person, place and time. Motor: RUE: Shoulder abduction, elbow flex 3+/5, elbow extension, wrist extension 2/5, hand gip 0-tr/5 LUE: Shoulder abduction, elbow flexion 3-/5, wrist and hand 0-tr/5 B/l LE: tr to 1+/5 proximal to distal Sensation diminished to light touch tr/2 below nipple line, 1/2 hands/wrists--unchanged Skin:  Incision c/d/i, scattered bruising on limbs Psychiatric: smiling more up beat    Assessment/Plan: 1. 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  Physiatrist is providing close team supervision  and 24 hour management of active medical problems listed below.  Physiatrist and rehab team continue to assess barriers to discharge/monitor patient progress toward functional and medical goals  Care Tool:  Bathing  Bathing activity did not occur: Refused Body parts bathed by patient: Right arm, Left arm, Chest, Left upper leg, Right upper leg   Body parts bathed by helper: Abdomen, Left lower leg, Right lower leg Body parts n/a: Face, Front perineal area, Buttocks(previously washed )   Bathing assist Assist Level: Maximal Assistance - Patient 24 - 49%     Upper Body Dressing/Undressing Upper body dressing   What is the patient wearing?: Pull over shirt(sitting EOB)    Upper body assist Assist Level: 2 Helpers    Lower Body Dressing/Undressing Lower body dressing      What is the patient wearing?: Incontinence brief, Pants(bedlevel)     Lower body assist Assist for lower body dressing: 2 Helpers     Toileting Toileting    Toileting assist Assist for toileting: Dependent - Patient 0%     Transfers Chair/bed transfer  Transfers assist     Chair/bed transfer assist level: 2 Helpers     Locomotion Ambulation   Ambulation assist   Ambulation activity did not occur: Safety/medical concerns          Walk 10 feet activity   Assist  Walk 10 feet activity did not occur: Safety/medical concerns        Walk 50 feet activity   Assist Walk 50 feet with 2 turns activity did  not occur: Safety/medical concerns         Walk 150 feet activity   Assist Walk 150 feet activity did not occur: Safety/medical concerns         Walk 10 feet on uneven surface  activity   Assist Walk 10 feet on uneven surfaces activity did not occur: Safety/medical concerns         Wheelchair     Assist Will patient use wheelchair at discharge?: Yes Type of Wheelchair: Power(TBD manual vs power) Wheelchair activity did not occur: Safety/medical concerns          Wheelchair 50 feet with 2 turns activity    Assist    Wheelchair 50 feet with 2 turns activity did not occur: Safety/medical concerns       Wheelchair 150 feet activity     Assist Wheelchair 150 feet activity did not occur: Safety/medical concerns         Medical Problem List and Plan: 1.  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  Status post C5-6 and C6-7 anterior cervical discectomy and fusion 11/29/2017.  TLSO back brace when out of bed  -Continue CIR therapies including PT, OT  2.  DVT Prophylaxis/Anticoagulation:  Acute mobile DVT Right common/proximal femoral vein  -IVCF placed by radiology   -mobilize pt today 3. Pain Management:   Neurontin 300 mg 3 times daily---increased to 400mg  11/7 but reduced back to 300mg  tid due to drowsiness  Valium 5 mg every 6 hours as needed muscle spasms  oxycodone as needed pain  -some improvement in pain 4. Mood: discussed coping after a life changing illness such as this  -offered support, answered questions  -neuropsych assessment this week 5. Neuropsych: This patient is capable of making decisions on her own behalf. 6. Skin/Wound Care: Routine skin checks 7. Fluids/Electrolytes/Nutrition: encourage PO  I personally reviewed the patient's labs today.    -replace K+  -added protein for hypoalbuminemia 8.  Neurogenic bowel and bladder.  Flomax 0.4 mg daily.    -I/O caths    -working on AM bowel program, senna-s + HS, AM suppository 9.  Hypertension.  Norvasc 10 mg daily.   Vitals:   12/11/17 2059 12/12/17 0312  BP: (!) 118/53 104/60  Pulse: 81 81  Resp: 18 16  Temp: 98 F (36.7 C) 98.1 F (36.7 C)  SpO2: 91% 92%  Controlled 12/12/2017 10. Acute blood loss anemia: hgb 10.6 11/7    -follow up hgb 10.9 today 11/11    LOS: 5 days A FACE TO FACE EVALUATION WAS PERFORMED  Meredith Staggers 12/12/2017, 8:56 AM

## 2017-12-12 NOTE — Progress Notes (Signed)
Supp given this am, dig stim, no stool felt in rectum. Sherry [name]

## 2017-12-12 NOTE — Progress Notes (Signed)
Occupational Therapy Session Note  Patient Details  Name: Sherry [name] MRN: [id number] Date of Birth: Apr 02, 1934  Today's Date: 12/12/2017 OT Individual Time: 1000-1058 and 1500-1510 OT Individual Time Calculation (min): 58 min and 10 min  Missed 50 minutes of skilled OT intervention secondary to increased pain  Short Term Goals: Week 1:  OT Short Term Goal 1 (Week 1): Pt will perform bed mobility with maxA+2 as precursor to EOB/OOB ADL. OT Short Term Goal 2 (Week 1): Pt will perform UB bathing with maxA using AE PRN.  OT Short Term Goal 3 (Week 1): Pt will don overhead shirt with maxA using compensatory strategies PRN.   Skilled Therapeutic Interventions/Progress Updates:    Session 1: Upon entering the room, pt supine in bed with daughter present. Pt requesting to wash and change clothing. Trial use of bath mitt on R hand to increase I with washing body parts. Pt needing +2 assistance for bed mobility to roll L <> R for LB hygiene and clothing management. Supine >sit with +2 total A and mod - max A for sitting balance to don pull over shirt and TLSO brace. Slide board transfer with total +2 assist into wheelchair. Pt reporting increase pain in B UEs with RN notified for medication management. Pt positioned in wheelchair for safety and comfort. Pt remained in wheelchair with soft call bell within reach and daughter present in room.   Session 2: Upon entering the room, pt seated in tilt in space and reports increased pain. Pt declines OT intervention and declines assistance to transfer back into bed. OT assisted with positioning in wheelchair and RN notified for pain medications.  Therapy Documentation Precautions:  Precautions Precautions: Cervical, Back, Fall Precaution Comments: back precautions due to thoracic fx s/p ACDF; needs TLSO for all OOB activity but per order set does not need cervical collar  Required Braces or Orthoses: Spinal Brace Spinal Brace: Thoracolumbosacral  orthotic, Applied in sitting position Restrictions Weight Bearing Restrictions: No    ADL: ADL Eating: Maximal assistance Where Assessed-Eating: Bed level Grooming: Maximal assistance Where Assessed-Grooming: Bed level Upper Body Bathing: Maximal assistance Where Assessed-Upper Body Bathing: Bed level Lower Body Bathing: Maximal assistance Where Assessed-Lower Body Bathing: Bed level Upper Body Dressing: Maximal assistance Where Assessed-Upper Body Dressing: Bed level Lower Body Dressing: Not assessed(wearing hospital gown this session )   Therapy/Group: Individual Therapy  [name] 12/12/2017, 12:20 PM

## 2017-12-13 ENCOUNTER — Inpatient Hospital Stay (HOSPITAL_COMMUNITY): Payer: Medicare HMO | Admitting: Physical Therapy

## 2017-12-13 ENCOUNTER — Encounter (HOSPITAL_COMMUNITY): Payer: Medicare HMO | Admitting: Psychology

## 2017-12-13 ENCOUNTER — Inpatient Hospital Stay (HOSPITAL_COMMUNITY): Payer: Medicare HMO | Admitting: Occupational Therapy

## 2017-12-13 DIAGNOSIS — M792 Neuralgia and neuritis, unspecified: Secondary | ICD-10-CM

## 2017-12-13 MED ORDER — MEGESTROL ACETATE 400 MG/10ML PO SUSP
400.0000 mg | Freq: Two times a day (BID) | ORAL | Status: DC
  Administered 2017-12-13 – 2017-12-19 (×13): 400 mg via ORAL
  Filled 2017-12-13 (×13): qty 10

## 2017-12-13 MED ORDER — GABAPENTIN 400 MG PO CAPS
400.0000 mg | ORAL_CAPSULE | Freq: Three times a day (TID) | ORAL | Status: DC
  Administered 2017-12-13 – 2018-01-10 (×112): 400 mg via ORAL
  Filled 2017-12-13 (×112): qty 1

## 2017-12-13 NOTE — Progress Notes (Signed)
Physical Therapy Session Note  Patient Details  Name: Sherry [name] MRN: [id number] Date of Birth: November 21, 1934  Today's Date: 12/13/2017 PT Individual Time: 2202-5427 PT Individual Time Calculation (min): 55 min   Short Term Goals: Week 1:  PT Short Term Goal 1 (Week 1): Pt will maintain sitting balance EOB x 5 min with max A x 1 PT Short Term Goal 2 (Week 1): Pt will tolerate sitting up in chair x 1 hour PT Short Term Goal 3 (Week 1): Pt will complete bed mobility with max A x 1  Skilled Therapeutic Interventions/Progress Updates:    no c/o pain at start of session, but increasing as session progresses.  Session focus on trunk control from w/c level, LE mobility, and slide board transfers.  5 reps forward/posterior weight shift with sustained prop elbows on knees, max assist to shift weight forward, min assist to control posterior shift.  Verbal cues for use of UEs to hook/pull on arm rests and knees and use of head to facilitate weight shifting in both directions.  Rest breaks between trials 2/2 pain and fatigue.  5-8 reps bilaterally LAQ and hamstring curl (gravity assisted) focus on muscle initiation and sustained activation.  Pt returned to room at end of session, +2 for slide board transfer back to bed, pt able to initiate forward weight shift with decreased assist.  Sit>supine with +2, and pt scooted to [facility] with +2 and draw sheet, but able to assist some with pushing through LEs in hook lying position.  Pt positioned to comfort with call bell in reach and needs met.   Therapy Documentation Precautions:  Precautions Precautions: Cervical, Back, Fall Precaution Comments: back precautions due to thoracic fx s/p ACDF; needs TLSO for all OOB activity but per order set does not need cervical collar  Required Braces or Orthoses: Spinal Brace Spinal Brace: Thoracolumbosacral orthotic, Applied in sitting position Restrictions Weight Bearing Restrictions: No    Therapy/Group: Individual  Therapy  Michel Santee 12/13/2017, 4:22 PM

## 2017-12-13 NOTE — Progress Notes (Signed)
Physical Therapy Session Note  Patient Details  Name: Sherry [name] MRN: [id number] Date of Birth: 08-07-1934  Today's Date: 12/13/2017 PT Individual Time: 1100-1130 PT Individual Time Calculation (min): 30 min   Short Term Goals: Week 1:  PT Short Term Goal 1 (Week 1): Pt will maintain sitting balance EOB x 5 min with max A x 1 PT Short Term Goal 2 (Week 1): Pt will tolerate sitting up in chair x 1 hour PT Short Term Goal 3 (Week 1): Pt will complete bed mobility with max A x 1  Skilled Therapeutic Interventions/Progress Updates:    Pt received seated in bed, agreeable to PT. Pt reports pain in BUE is improving, current pain not rated. Supine BLE PROM stretches in all available planes of motion to prevent contracture. Tone noted in BLE with R>L. Pt reports she is able to feel inner thigh muscles in LLE with stretches. Pt left semi-reclined in bed with needs in reach and bed alarm in place at end of therapy session.  Therapy Documentation Precautions:  Precautions Precautions: Cervical, Back, Fall Precaution Comments: back precautions due to thoracic fx s/p ACDF; needs TLSO for all OOB activity but per order set does not need cervical collar  Required Braces or Orthoses: Spinal Brace Spinal Brace: Thoracolumbosacral orthotic, Applied in sitting position Restrictions Weight Bearing Restrictions: No   Therapy/Group: Individual Therapy  Excell Seltzer, PT, DPT  12/13/2017, 12:19 PM

## 2017-12-13 NOTE — Consult Note (Signed)
Neuropsychological Consultation   Patient:   Sherry [name]   DOB:   [birth year]  MR Number:  [id number]  Location:  [facility] [id number] [place] [address] Dept: [facility]: [phone]           Date of Service:   12/13/2017  Start Time:   2 PM End Time:   3 PM  Provider/Observer:  Ilean Skill, Psy.D.       Clinical Neuropsychologist       Billing Code/Service: [phone] Units  Chief Complaint:    Sherry [name] is an 83 year old female with a history of hypertension.  The patient presented on 11/29/2017 after a fall with immediate quadriparesis.  Cranial CT scans reviewed and were unremarkable for acute intracranial process.  CT/MRI imaging showed C6-7 disc fracture and tear of anterior longitudinal ligament.  Paraspinal muscle and interspinous edema from C1-C7 compatible with ligament injury and muscle strain.  Increased cord signal at C5 and C6.  No cord hemorrhage.  T11 coronal vertebral body fracture was noted.  The patient underwent C5-6, C6-7 anterior cervical discectomy and fusion on 11/29/2017.  The patient has had continued severe motor deficits.  The patient has begun to regain some motor movement in her arms but has significant deficits in her hands.  She has some increasing motor movement for her left leg but little movement in her right leg and foot.  The patient is having significant adjustment difficulties and is feeling very anxious, depressed and tearful with the realization of the degree of loss of functioning she is having to cope with.  Reason for Service:  The patient was referred for neuropsychological consultation due to coping and adjustment issues following a cervical central cord injury with resulting severe motor deficits.  Below is the HPI for the current admission.  HPI: Sherry [name] is an 83 year old right-handed female with history of  hypertension.  Per chart review, family, and patient, lives with spouse.  Independent prior to admission.  Multilevel home with bed and bath on main level.  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  She has multiple family in the area.  Presented 11/29/2017 after a fall with immediate quadriparesis.  Cranial CT scan reviewed, unremarkable for acute intracranial process. CT/MRI imaging showed C6-7 disc fracture and tear of anterior longitudinal ligament.  Paraspinal muscle and interspinous edema from C1-C7 compatible ligament injury and muscle strain.  Increased cord signal at C5 and C6.  No cord hemorrhage and also noted T11 coronal vertebral body fracture.  Underwent C5-6, C6-7 anterior cervical discectomy and fusion 11/29/2017 per Dr. Venetia Constable.  TLSO back brace when out of bed applied in sitting position.  Hospital course pain management.  Subcutaneous heparin for DVT prophylaxis.  Therapy evaluations completed with recommendations of physical medicine rehab consult.  Patient was admitted for a comprehensive rehab program.  Current Status:  During the clinical interview today the patient describes significant anxiety and depressive reactions to her motor deficits.  She reports that she is having regular bouts of severe pain and is having intrusive thoughts about how much of a burden she would be to her family if she does not regain more motor control and motor function.  The patient describes feelings of helplessness and hopelessness.  However, these are acute symptoms and it is unclear how much of these are related to a clinical depression versus acute reactive depression.  Behavioral Observation: Sherry [name]  presents as a 83 y.o.-year-old Right Caucasian Female who appeared her stated age. her dress was Appropriate and she was Well Groomed and her manners were Appropriate to the situation.  her participation was indicative of Appropriate and Attentive behaviors.  There were  any physical disabilities noted.  she displayed an appropriate level of cooperation and motivation.     Interactions:    Active Appropriate and Attentive  Attention:   within normal limits and attention span and concentration were age appropriate  Memory:   within normal limits; recent and remote memory intact  Visuo-spatial:  not examined  Speech (Volume):  low  Speech:   normal; normal  Thought Process:  Coherent and Relevant  Though Content:  WNL; not suicidal and not homicidal  Orientation:   person, place, time/date and situation  Judgment:   Good  Planning:   Good  Affect:    Anxious, Depressed and Tearful  Mood:    Anxious and Depressed  Insight:   Good  Intelligence:   high  Medical History:   Past Medical History:  Diagnosis Date  . Arthritis   . Cancer ([place])    skin  . Hypertension         Abuse/Trauma History: The patient recently had a traumatic fall where she suffered cervical central cord injury resulting in severe motor deficits from her neck down.  Psychiatric History:  The patient denies any prior psychiatric history.  Family Med/Psych History:  Family History  Problem Relation Age of Onset  . Breast cancer Maternal Aunt 75    Risk of Suicide/Violence: low the patient denies any current suicidal or homicidal ideation but she is having significant reactive depression and adjustment issues currently coping with the realization that if she does not experience significant improvements in motor functioning she will be highly dependent upon others for even her basic care.  Impression/DX:  Sherry [name] is an 83 year old female with a history of hypertension.  The patient presented on 11/29/2017 after a fall with immediate quadriparesis.  Cranial CT scans reviewed and were unremarkable for acute intracranial process.  CT/MRI imaging showed C6-7 disc fracture and tear of anterior longitudinal ligament.  Paraspinal muscle and interspinous edema from  C1-C7 compatible with ligament injury and muscle strain.  Increased cord signal at C5 and C6.  No cord hemorrhage.  T11 coronal vertebral body fracture was noted.  The patient underwent C5-6, C6-7 anterior cervical discectomy and fusion on 11/29/2017.  The patient has had continued severe motor deficits.  The patient has begun to regain some motor movement in her arms but has significant deficits in her hands.  She has some increasing motor movement for her left leg but little movement in her right leg and foot.  The patient is having significant adjustment difficulties and is feeling very anxious, depressed and tearful with the realization of the degree of loss of functioning she is having to cope with.  During the clinical interview today the patient describes significant anxiety and depressive reactions to her motor deficits.  She reports that she is having regular bouts of severe pain and is having intrusive thoughts about how much of a burden she would be to her family if she does not regain more motor control and motor function.  The patient describes feelings of helplessness and hopelessness.  However, these are acute symptoms and it is unclear how much of these are related to a clinical depression versus acute reactive depression.  Disposition/Plan:  I will follow-up with the patient later this week or the first of next week regarding the adjustment disorder/reactive depression symptoms.  The patient is having a great deal of difficulty coping with the acute loss of motor functioning following her central cord injury and resulting motor deficits.         Electronically Signed   _______________________ Ilean Skill, Psy.D.

## 2017-12-13 NOTE — Progress Notes (Signed)
Occupational Therapy Session Note  Patient Details  Name: Sherry [name] MRN: [id number] Date of Birth: 1934-07-21  Today's Date: 12/13/2017 OT Individual Time: 8527-7824 OT Individual Time Calculation (min): 73 min    Short Term Goals: Week 1:  OT Short Term Goal 1 (Week 1): Pt will perform bed mobility with maxA+2 as precursor to EOB/OOB ADL. OT Short Term Goal 2 (Week 1): Pt will perform UB bathing with maxA using AE PRN.  OT Short Term Goal 3 (Week 1): Pt will don overhead shirt with maxA using compensatory strategies PRN.   Skilled Therapeutic Interventions/Progress Updates:    Upon entering the room, pt supine in bed with daughter present in the room. Pt reports increase in pain in neck and B shoulders. Pt has recently had pain medication. She reports feeling like she "can't catch up (in regards to pain)". Pt requests assistance with bathing and changing clothing as night bath did not happen secondary to increased pain as well. Pt rolling L <> R with max A and able to hook UE onto bedrail. Pt washing as much as she was able for UB but needing further assist secondary to pain and fatigue. All movements very slow this session with increased cuing so pt able to prepare for movement secondary to pain. Pt coming into long sitting with max A from therapist for UB clothing management and to wash back. OT positioned pt for safety and comfort. Pt requesting to remain in bed until next therapy session. Soft call bell placed within reach and bed alarm activated.   Therapy Documentation Precautions:  Precautions Precautions: Cervical, Back, Fall Precaution Comments: back precautions due to thoracic fx s/p ACDF; needs TLSO for all OOB activity but per order set does not need cervical collar  Required Braces or Orthoses: Spinal Brace Spinal Brace: Thoracolumbosacral orthotic, Applied in sitting position Restrictions Weight Bearing Restrictions: No  Pain: Pain Assessment Pain Scale:  0-10 Pain Score: 7  Pain Type: Acute pain Pain Location: Generalized Pain Orientation: Right;Left Pain Descriptors / Indicators: Aching Pain Frequency: Constant Pain Onset: On-going Patients Stated Pain Goal: 2 Pain Intervention(s): Medication (See eMAR) ADL: ADL Eating: Maximal assistance Where Assessed-Eating: Bed level Grooming: Maximal assistance Where Assessed-Grooming: Bed level Upper Body Bathing: Maximal assistance Where Assessed-Upper Body Bathing: Bed level Lower Body Bathing: Maximal assistance Where Assessed-Lower Body Bathing: Bed level Upper Body Dressing: Maximal assistance Where Assessed-Upper Body Dressing: Bed level Lower Body Dressing: Not assessed(wearing hospital gown this session )   Therapy/Group: Individual Therapy  [name] 12/13/2017, 10:45 AM

## 2017-12-13 NOTE — Progress Notes (Signed)
Physical Therapy Session Note  Patient Details  Name: Sherry [name] MRN: [id number] Date of Birth: 1934/08/19  Today's Date: 12/13/2017 PT Individual Time: 1300-1400 PT Individual Time Calculation (min): 60 min   Short Term Goals: Week 1:  PT Short Term Goal 1 (Week 1): Pt will maintain sitting balance EOB x 5 min with max A x 1 PT Short Term Goal 2 (Week 1): Pt will tolerate sitting up in chair x 1 hour PT Short Term Goal 3 (Week 1): Pt will complete bed mobility with max A x 1  Skilled Therapeutic Interventions/Progress Updates: Pt received in bed with NT completing catheterization. C/o pain 8/10 in L side neck, medicated at beginning of session, and agreeable to treatment. Rolling R/L totalA (pt <25%, assists with UE reaching for bedrails). Supine>sit with HOB elevated, +2 assist, pt assists moving LLE towards EOB. Lateral scoot transfer w/c <>bed/mat table max/totalA +2, cues for head/hips relationship and hand position. Sitting balance with focus on reclined>upright sitting, and forward propped elbows on knees>upright for anterior/posterior core activation/strengthening. Educated re: head position and its effect on achieving/maintaining balance and midline orientation. Performed soft tissue massage to L upper traps, scalenes, paraspinal musculature for pain management; several palpable trigger points noted throughout. Mat table elevated to suspend LEs; pt demo's kicking soccer ball BLE (MMT 2/5 in RLE with variable available AROM depending on fatigue, 3-/5 LLE). Returned to room totalA in w/c. Provided additional soft tissue massage while pt reclined and support in w/c, again to L c-spine, upper traps. Discussed pt hobbies, daily routine at home; reports she enjoys doing yard work and cooking/cleaning around the house. States she recently retired at [employer] and "planned to catch up on the 40 years of TV I've missed", admits she had gotten fairly sedentary over the past year. Remained in w/c with  brother and neuropsychologist present, all needs in reach.      Therapy Documentation Precautions:  Precautions Precautions: Cervical, Back, Fall Precaution Comments: back precautions due to thoracic fx s/p ACDF; needs TLSO for all OOB activity but per order set does not need cervical collar  Required Braces or Orthoses: Spinal Brace Spinal Brace: Thoracolumbosacral orthotic, Applied in sitting position Restrictions Weight Bearing Restrictions: No Pain: Pain Assessment Pain Scale: 0-10 Pain Score: 8  Pain Type: Chronic pain Pain Location: Back Pain Orientation: Right;Left Pain Radiating Towards: arms and hands Pain Descriptors / Indicators: Aching;Tingling;Sharp Pain Frequency: Constant Pain Onset: On-going Pain Intervention(s): Medication (See eMAR)    Therapy/Group: Individual Therapy  Corliss Skains 12/13/2017, 2:18 PM

## 2017-12-13 NOTE — Progress Notes (Addendum)
Initial Nutrition Assessment  DOCUMENTATION CODES:   Not applicable (will assess for malnutrition at follow-up after completion of NFPE)  INTERVENTION:   - Recommend obtaining new weight as last recorded weight is from 10/28  - Continue MVI with minerals daily  - Afternoon snack daily (RD ordered via [place])  - Continue 30 ml Prostat BID, each supplement provides 100 kcals and 15 grams protein.   NUTRITION DIAGNOSIS:   Inadequate oral intake related to poor appetite, early satiety as evidenced by per patient/family report.  GOAL:   Patient will meet greater than or equal to 90% of their needs  MONITOR:   PO intake, Labs, Skin, I & O's, Weight trends  REASON FOR ASSESSMENT:   Consult Poor PO  ASSESSMENT:   83 year old female with PMH significant for hypertension. Pt presented on 11/29/2017 after a fall with immediate quadriparesis. Cranial CT scan unremarkable for acute intracranial process. CT/MRI imaging showed C6-7 disc fracture and tear of anterior longitudinal ligament. Paraspinal muscle and interspinous edema from C1-C7 compatible ligament injury and muscle strain. Increased cord signal at C5 and C6. T11 coronal vertebral body fracture. Pt underwent C5-6, C6-7 anterior cervical discectomy and fusion on 11/29/2017 per Dr. Venetia Constable.  11/7 - s/p IVC filter placement  Spoke with pt at bedside. Pt looking forward to eating chicken and dumplings and asparagus for lunch. NT cutting food for pt at time of visit.  Pt shares that her appetite remains poor due to her stomach feeling "tight."  Pt states that she drinks a smoothie that her daughter brings her each day. The smoothie contains celery, bok choy, olive oil, flax seed, chia seeds, cocoa powder, and sweetener. Pt shares that she and her husband drink this smoothie daily.  Pt reports good appetite and normal PO intake prior to her fall.  Pt is not amenable to trying any of the oral nutrition  supplements available since they contain milk products. RD to order afternoon snack for pt to aid in meeting kcal and protein needs given poor appetite and inconsistent PO intake.  Discussed the importance of adequate PO intake due to increased nutrient needs related to therapies and wound healing. Pt expressed understanding.  Pt shares that she gained weight after retiring last year and now wants to lose weight. RD discussed importance of not focusing on weight loss at this time due to need to maintain lean muscle mass.  Meal Completion: 0-100% x last 8 meals (very inconsistent)  Medications reviewed and include: Dulcolax, Pro-stat BID, Megace, liquid MVI, K-dur, Senokot-S  Labs reviewed: potassium 3.3 (L)  UOP: 1400 ml x 24 hours  NUTRITION - FOCUSED PHYSICAL EXAM:  Deferred at this time. NT in room feeding pt lunch.  Diet Order:   Diet Order            Diet regular Room service appropriate? Yes; Fluid consistency: Thin  Diet effective now              EDUCATION NEEDS:   Education needs have been addressed  Skin:  Skin Assessment: Skin Integrity Issues: DTI: right heel Incisions: left groin, neck Other: laceration to left elbow  Last BM:  11/11 (small type 6)  Height:   Ht Readings from Last 1 Encounters:  11/28/17 5\' 7"  (1.702 m)    Weight:   Wt Readings from Last 1 Encounters:  11/28/17 78.9 kg    Ideal Body Weight:  61.36 kg  BMI: 27.2 kg/m^2  (calculated using height and weight from 10/28)  Estimated Nutritional Needs:   Kcal:  1500-1700  Protein:  80-95 grams  Fluid:  1.5-1.7 L    Gaynell Face, MS, RD, LDN Inpatient Clinical Dietitian Pager: [phone] Weekend/After Hours: [phone]

## 2017-12-13 NOTE — Progress Notes (Signed)
[facility] PHYSICAL MEDICINE & REHABILITATION PROGRESS NOTE   Subjective/Complaints: Had more severe pain come on yesterday. Hasn't been able to "catch up" since then. Working on bowel program but no consistent results yet. Po intake inconsistent at best. Hesitant to eat as she feels "tight" around her stomach  ROS: Patient denies fever, rash, sore throat, blurred vision, nausea, vomiting, diarrhea, cough, shortness of breath or chest pain, headache, or mood change.     Objective:   No results found. Recent Labs    12/12/17 0551  WBC 11.6*  HGB 10.9*  HCT 34.1*  PLT 282   Recent Labs    12/12/17 0551  NA 135  K 3.3*  CL 101  CO2 30  GLUCOSE 114*  BUN 16  CREATININE 0.53  CALCIUM 8.0*    Intake/Output Summary (Last 24 hours) at 12/13/2017 0900 Last data filed at 12/13/2017 0815 Gross per 24 hour  Intake 660 ml  Output 1800 ml  Net -1140 ml     Physical Exam: Vital Signs Blood pressure (!) 108/58, pulse 80, temperature 99 F (37.2 C), temperature source Oral, resp. rate 17, SpO2 92 %.  Constitutional: No distress . Vital signs reviewed. HEENT: EOMI, oral membranes moist Neck: supple Cardiovascular: RRR without murmur. No JVD    Respiratory: CTA Bilaterally without wheezes or rales. Normal effort    GI: BS +, non-tender, non-distended  Musculoskeletal:  Dependent edema LE's, tr UE Uro: foley  in place Neurological: She is alert.  Alert.  She is oriented to person, place and time. Motor: RUE: Shoulder abduction, elbow flex 3+/5, elbow extension, wrist extension 2/5, hand gip 0-tr/5 LUE: Shoulder abduction, elbow flexion 3-/5, wrist and hand 0-tr/5 B/l LE: tr to 1+/5 proximal to distal Sensation diminished to light touch tr/2 below nipple line, 1/2 hands/wrists--stable on exam. No resting tone in UE and LE Skin:  Incision c/d/i, scattered bruising on limbs--some improvement Psychiatric: flat, reserved    Assessment/Plan: 1. Functional deficits  secondary to C6 SCI, motor/sensory incomplete which require 3+ hours per day of interdisciplinary therapy in a comprehensive inpatient rehab setting.  Physiatrist is providing close team supervision and 24 hour management of active medical problems listed below.  Physiatrist and rehab team continue to assess barriers to discharge/monitor patient progress toward functional and medical goals  Care Tool:  Bathing  Bathing activity did not occur: Refused Body parts bathed by patient: Right arm, Left arm, Chest, Left upper leg, Right upper leg, Abdomen   Body parts bathed by helper: Front perineal area, Buttocks, Right lower leg, Left lower leg, Face Body parts n/a: Face, Front perineal area, Buttocks(previously washed )   Bathing assist Assist Level: 2 Helpers     Upper Body Dressing/Undressing Upper body dressing   What is the patient wearing?: Pull over shirt    Upper body assist Assist Level: 2 Helpers    Lower Body Dressing/Undressing Lower body dressing      What is the patient wearing?: Incontinence brief, Pants     Lower body assist Assist for lower body dressing: 2 Helpers     Toileting Toileting    Toileting assist Assist for toileting: Dependent - Patient 0%     Transfers Chair/bed transfer  Transfers assist     Chair/bed transfer assist level: 2 Helpers     Locomotion Ambulation   Ambulation assist   Ambulation activity did not occur: Safety/medical concerns          Walk 10 feet activity   Assist  Walk 10 feet activity did not occur: Safety/medical concerns        Walk 50 feet activity   Assist Walk 50 feet with 2 turns activity did not occur: Safety/medical concerns         Walk 150 feet activity   Assist Walk 150 feet activity did not occur: Safety/medical concerns         Walk 10 feet on uneven surface  activity   Assist Walk 10 feet on uneven surfaces activity did not occur: Safety/medical concerns          Wheelchair     Assist Will patient use wheelchair at discharge?: Yes Type of Wheelchair: Power(TBD manual vs power) Wheelchair activity did not occur: Safety/medical concerns         Wheelchair 50 feet with 2 turns activity    Assist    Wheelchair 50 feet with 2 turns activity did not occur: Safety/medical concerns       Wheelchair 150 feet activity     Assist Wheelchair 150 feet activity did not occur: Safety/medical concerns         Medical Problem List and Plan: 1.  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  Status post C5-6 and C6-7 anterior cervical discectomy and fusion 11/29/2017.  TLSO back brace when out of bed  -Continue CIR therapies including PT, OT   --Interdisciplinary Team Conference today   2.  DVT Prophylaxis/Anticoagulation:  Acute mobile DVT Right common/proximal femoral vein  -IVCF placed by radiology   -mobilize pt today 3. Pain Management:   Neurontin increase to 400mg  qid. Had discussion with pt/daughter re: pros and cons of treating pain---observe closely for tolerance  Valium 5 mg every 6 hours as needed muscle spasms  oxycodone as needed pain 4. Mood: discussed coping after a life changing illness such as this  -offered support, answered questions  -neuropsych assessment this week 5. Neuropsych: This patient is capable of making decisions on her own behalf. 6. Skin/Wound Care: Routine skin checks 7. Fluids/Electrolytes/Nutrition: encourage PO  I personally reviewed the patient's labs today.    -replace K+  -added protein for hypoalbuminemia  -trial of megace----need to improve PO intake 8.  Neurogenic bowel and bladder.  Flomax 0.4 mg daily.    -I/O caths    -working on AM bowel program, senna-s + HS, AM suppository   -difficult given ongoing inconsistent PO intake 9.  Hypertension.  Norvasc 10 mg daily.   Vitals:   12/12/17 1908 12/13/17 0423  BP: (!) 125/58 (!) 108/58   Pulse: 93 80  Resp: 19 17  Temp: 98.4 F (36.9 C) 99 F (37.2 C)  SpO2:  92%  Controlled 12/13/2017 10. Acute blood loss anemia: hgb 10.6 11/7    -follow up hgb 10.9  11/11    LOS: 6 days A FACE TO FACE EVALUATION WAS PERFORMED  Meredith Staggers 12/13/2017, 9:00 AM

## 2017-12-14 ENCOUNTER — Inpatient Hospital Stay (HOSPITAL_COMMUNITY): Payer: Medicare HMO | Admitting: Physical Therapy

## 2017-12-14 ENCOUNTER — Inpatient Hospital Stay (HOSPITAL_COMMUNITY): Payer: Medicare HMO | Admitting: Occupational Therapy

## 2017-12-14 ENCOUNTER — Inpatient Hospital Stay (HOSPITAL_COMMUNITY): Payer: Medicare HMO

## 2017-12-14 NOTE — Plan of Care (Signed)
  Problem: Consults Goal: RH SPINAL CORD INJURY PATIENT EDUCATION Description  See Patient Education module for education specifics.  Outcome: Progressing   Problem: SCI BOWEL ELIMINATION Goal: RH STG MANAGE BOWEL WITH ASSISTANCE Description STG Manage Bowel with total Assistance.  Outcome: Progressing Goal: RH STG SCI MANAGE BOWEL PROGRAM W/ASSIST OR AS APPROPRIATE Description STG SCI Manage bowel program w/total assist or as appropriate.  Outcome: Progressing   Problem: SCI BLADDER ELIMINATION Goal: RH STG MANAGE BLADDER WITH MEDICATION WITH ASSISTANCE Description STG Manage Bladder With Medication With  Total Assistance.  Outcome: Progressing   Problem: RH SKIN INTEGRITY Goal: RH STG SKIN FREE OF INFECTION/BREAKDOWN Description With total assist  Outcome: Progressing Goal: RH STG MAINTAIN SKIN INTEGRITY WITH ASSISTANCE Description STG Maintain Skin Integrity With total Assistance.  Outcome: Progressing   Problem: RH SAFETY Goal: RH STG ADHERE TO SAFETY PRECAUTIONS W/ASSISTANCE/DEVICE Description STG Adhere to Safety Precautions With max Assistance/Device.  Outcome: Progressing   Problem: RH PAIN MANAGEMENT Goal: RH STG PAIN MANAGED AT OR BELOW PT'S PAIN GOAL Description At or below level 6  Outcome: Progressing   Problem: RH KNOWLEDGE DEFICIT SCI Goal: RH STG INCREASE KNOWLEDGE OF SELF CARE AFTER SCI Description Pt and family will be able to direct care post discharge using handout/resources independently  Outcome: Progressing

## 2017-12-14 NOTE — Progress Notes (Signed)
Physical Therapy Session Note  Patient Details  Name: Sherry [name] MRN: [id number] Date of Birth: Sep 01, 1934  Today's Date: 12/14/2017 PT Individual Time: 4098-1191 PT Individual Time Calculation (min): 75 min   Short Term Goals: Week 1:  PT Short Term Goal 1 (Week 1): Pt will maintain sitting balance EOB x 5 min with max A x 1 PT Short Term Goal 2 (Week 1): Pt will tolerate sitting up in chair x 1 hour PT Short Term Goal 3 (Week 1): Pt will complete bed mobility with max A x 1  Skilled Therapeutic Interventions/Progress Updates: Pt received seated in w/c, c/o pain as below and agreeable to treatment. Transferred to/from tilt table with maximove totalA. WB through BLE on 20 degrees performed 4 sets 5 reps mini-squats with assist to lower into squat, multimodal cueing for hip/knee extension with LLE activation >RLE, RLE with improved activation with tapping to facilitate quad activation. Maxi slide under pt's torso to reduce friction. During rest break between first 2 sets and second 2 sets of squats, elevated up into 50 degrees gradually, vitals as below with each position held 2-4 min to allow accomodation of BP; pt asymptomatic throughout. Pt with only TEDs on.   Supine: 137/79 20 degrees: 130/69 30 degrees: 119/68 40 degrees: 120/73 50 degrees: 116/72  Returned to bed totalA in maxi move. Rolling R/L to remove TLSO and maxi sling. Remained seated in bed at end of session, brother and son in present, all needs in reach.      Therapy Documentation Precautions:  Precautions Precautions: Cervical, Back, Fall Precaution Comments: back precautions due to thoracic fx s/p ACDF; needs TLSO for all OOB activity but per order set does not need cervical collar  Required Braces or Orthoses: Spinal Brace Spinal Brace: Thoracolumbosacral orthotic, Applied in sitting position Restrictions Weight Bearing Restrictions: No Pain: Pain Assessment Pain Scale: 0-10 Pain Score: 4  Pain Type:  Acute pain Pain Location: Hand Pain Orientation: Right;Left Pain Descriptors / Indicators: Burning;Aching Pain Frequency: Constant Pain Onset: On-going Pain Intervention(s): Medication (See eMAR)    Therapy/Group: Individual Therapy  Corliss Skains 12/14/2017, 3:42 PM

## 2017-12-14 NOTE — Progress Notes (Signed)
Occupational Therapy Session Note  Patient Details  Name: Sherry [name] MRN: [id number] Date of Birth: 12/12/1934  Today's Date: 12/14/2017 OT Individual Time: 4132-4401 OT Individual Time Calculation (min): 72 min    Short Term Goals: Week 1:  OT Short Term Goal 1 (Week 1): Pt will perform bed mobility with maxA+2 as precursor to EOB/OOB ADL. OT Short Term Goal 2 (Week 1): Pt will perform UB bathing with maxA using AE PRN.  OT Short Term Goal 3 (Week 1): Pt will don overhead shirt with maxA using compensatory strategies PRN.   Skilled Therapeutic Interventions/Progress Updates:    Upon entering the room, pt sleeping soundly and difficult to arouse for therapy session. RN reports pt recently received pain medications and muscle relaxer. Pt very lethargic initially but still agreeable to OT intervention and pt seen from bed level for safety. Focus on controlled movements of B UEs. Pt performed AROM while supine in all planes of movement with B UEs with verbal cuing to min A for controlled movements. Several rest breaks needed secondary to fatigue. Pt starting from supported position in bed and hooking UE onto bed rail to pull self into long sitting with max A. Pt attempting to hold position on B elbows with mod A as pt has R lateral lean. Pt returning to bed at end of session and positioned for comfort. Assistance to place B UE's around cup but pt able to bring to mouth. Bed alarm activated and call bell within reach.   Therapy Documentation Precautions:  Precautions Precautions: Cervical, Back, Fall Precaution Comments: back precautions due to thoracic fx s/p ACDF; needs TLSO for all OOB activity but per order set does not need cervical collar  Required Braces or Orthoses: Spinal Brace Spinal Brace: Thoracolumbosacral orthotic, Applied in sitting position Restrictions Weight Bearing Restrictions: No Pain: Pain Assessment Pain Scale: 0-10 Pain Score: 4  Pain Type: Acute pain Pain  Location: Hand Pain Orientation: Right;Left Pain Radiating Towards: arms Pain Descriptors / Indicators: Burning Pain Frequency: Constant Pain Onset: On-going Pain Intervention(s): Medication (See eMAR) ADL: ADL Eating: Maximal assistance Where Assessed-Eating: Bed level Grooming: Maximal assistance Where Assessed-Grooming: Bed level Upper Body Bathing: Maximal assistance Where Assessed-Upper Body Bathing: Bed level Lower Body Bathing: Maximal assistance Where Assessed-Lower Body Bathing: Bed level Upper Body Dressing: Maximal assistance Where Assessed-Upper Body Dressing: Bed level Lower Body Dressing: Not assessed(wearing hospital gown this session )   Therapy/Group: Individual Therapy  [name] 12/14/2017, 12:17 PM

## 2017-12-14 NOTE — Patient Care Conference (Signed)
Inpatient RehabilitationTeam Conference and Plan of Care Update Date: 12/13/2017   Time: 2:00 PM    Patient Name: Sherry [name]      Medical Record Number: [id number]  Date of Birth: 10-15-1934 Sex: Female         Room/Bed: 4W13C/4W13C-01 Payor Info: Payor: [employer] MEDICARE / Plan: [employer] MEDICARE HMO/PPO / Product Type: *No Product type* /    Admitting Diagnosis: Fall with SCI   central cord syndrome  Admit Date/Time:  12/07/2017  5:47 PM Admission Comments: No comment available   Primary Diagnosis:  <principal problem not specified> Principal Problem: <principal problem not specified>  Patient Active Problem List   Diagnosis Date Noted  . Trauma   . 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 Neuropathic pain   . Neurogenic bowel   . Neurogenic bladder   . Benign essential HTN   . Acute blood loss anemia   . Central cord syndrome at C6 level of cervical spinal cord ([place]) 11/29/2017  . Central cord syndrome [facility]) 11/29/2017    Expected Discharge Date: Expected Discharge Date: (4 weeks vs. SNF)  Team Members Present: Physician leading conference: Dr. Alger Simons Social Worker Present: Lennart Pall, LCSW Nurse Present: Isla Pence, RN PT Present: Dwyane Dee, PT OT Present: Benay Pillow, OT SLP Present: Charolett Bumpers, SLP PPS Coordinator present : Daiva Nakayama, RN, CRRN     Current Status/Progress Goal Weekly Team Focus  Medical   C6-7 SCI. ongoing pain, neurogenic bowel and bladder. poor po intake  improve pain and nutrition  pain control, bladder/bowel program. nutrition   Bowel/Bladder   Patient is incontinent of Bowel and Bladder. Pt has a neurogenic bowel and bladder. Will seldom void or have a bowel movement without intervention. Bowel program and scheduled PVRs and I & O caths ordered.      Continue to assist with toileting needs as needed.    Swallow/Nutrition/ Hydration             ADL's   total A +2, slide board transfer  mod A  self care retraining with AE, balance,  functional transfers, pt/family education, B UE interventions   Mobility   total +2   mod bed and transfers, max car transfer, supervision w/c (likely power)  education, balance, functional mobility progression, activity tolerance   Communication             Safety/Cognition/ Behavioral Observations            Pain   Patient complains of pain to bilateral hands, arms, and lower back. Recieves prn oxycodone and valium. Pt received oxycodone x3 this shift-partial effects noted.   Maintain pain level <3.  Assess pain every shift and as needed.    Skin   Surgical incision noted to anterior neck, right discolored heel with allevyn intact, old laceration noted to left elbow  No s/sx of infection, maintain skin integrity.   Assess skin every shift and as needed.     Rehab Goals Patient on target to meet rehab goals: Yes *See Care Plan and progress notes for long and short-term goals.     Barriers to Discharge  Current Status/Progress Possible Resolutions Date Resolved   Physician    Medical stability               Nursing                  PT  OT                  SLP                SW                Discharge Planning/Teaching Needs:  Pt to d/c home with family to provide 24/7 assistance.  Daughter already beginning home modifications.  Teaching ongoing.  Daughter here frequently.   Team Discussion:  MD addressing pain, motor function.  Continue to work on b/b program.  Currently caths q 4hr with large volumes.  +DVT with IVC filter placed.  Pain c/o vary but med changes seem to be addressing this.  Pt very motivated.  Total assist with mobility and anticipate will need power mobility for home.  Mod assist w/c level goals overall.  Revisions to Treatment Plan:  NA    Continued Need for Acute Rehabilitation Level of Care: The patient requires daily medical management by a physician with specialized training in physical medicine and rehabilitation for the  following conditions: Daily direction of a multidisciplinary physical rehabilitation program to ensure safe treatment while eliciting the highest outcome that is of practical value to the patient.: Yes Daily medical management of patient stability for increased activity during participation in an intensive rehabilitation regime.: Yes Daily analysis of laboratory values and/or radiology reports with any subsequent need for medication adjustment of medical intervention for : Neurological problems;Urological problems;Other   I attest that I was present, lead the team conference, and concur with the assessment and plan of the team.   Hareem Surowiec 12/14/2017, 10:50 AM

## 2017-12-14 NOTE — Care Management (Signed)
Inpatient [facility] Individual Statement of Services  Patient Name:  [name]  Date:  12/14/2017  Welcome to the [facility].  Our goal is to provide you with an individualized program based on your diagnosis and situation, designed to meet your specific needs.  With this comprehensive rehabilitation program, you will be expected to participate in at least 3 hours of rehabilitation therapies Monday-Friday, with modified therapy programming on the weekends.  Your rehabilitation program will include the following services:  Physical Therapy (PT), Occupational Therapy (OT), 24 hour per day rehabilitation nursing, Therapeutic Recreaction (TR), Neuropsychology, Case Management (Social Worker), Rehabilitation Medicine, Nutrition Services and Pharmacy Services  Weekly team conferences will be held on Tuesdays to discuss your progress.  Your Social Worker will talk with you frequently to get your input and to update you on team discussions.  Team conferences with you and your family in attendance may also be held.  Expected length of stay: 4 weeks   Overall anticipated outcome: moderate assistance at wheelchair level Depending on your progress and recovery, your program may change. Your Social Worker will coordinate services and will keep you informed of any changes. Your Social Worker's name and contact numbers are listed  below.  The following services may also be recommended but are not provided by the [facility] will be made to provide these services after discharge if needed.  Arrangements include referral to agencies that provide these services.  Your insurance has been verified to be:  [employer] Your primary doctor is: Glendon Axe    Pertinent information will be shared with your doctor and your insurance  company.  Social Worker:  [place], [place] or ([phone]   Information discussed with and copy given to patient by: Lennart Pall, 12/14/2017, 8:35 AM

## 2017-12-14 NOTE — Progress Notes (Signed)
[facility] PHYSICAL MEDICINE & REHABILITATION PROGRESS NOTE   Subjective/Complaints: Had a better day yesterday. No new problems this morning. Ready to get up with therapy---likes working with the team  ROS: Patient denies fever, rash, sore throat, blurred vision, nausea, vomiting, diarrhea, cough, shortness of breath or chest pain,  headache, or mood change.       Objective:   No results found. Recent Labs    12/12/17 0551  WBC 11.6*  HGB 10.9*  HCT 34.1*  PLT 282   Recent Labs    12/12/17 0551  NA 135  K 3.3*  CL 101  CO2 30  GLUCOSE 114*  BUN 16  CREATININE 0.53  CALCIUM 8.0*    Intake/Output Summary (Last 24 hours) at 12/14/2017 0908 Last data filed at 12/14/2017 0207 Gross per 24 hour  Intake 840 ml  Output 1850 ml  Net -1010 ml     Physical Exam: Vital Signs Blood pressure 114/65, pulse 76, temperature 97.8 F (36.6 C), temperature source Oral, resp. rate 18, SpO2 93 %.  Constitutional: No distress . Vital signs reviewed. HEENT: EOMI, oral membranes moist Neck: supple Cardiovascular: RRR without murmur. No JVD    Respiratory: CTA Bilaterally without wheezes or rales. Normal effort    GI: BS +, non-tender, non-distended  Musculoskeletal:  Dependent tr to 1+ edema LE's, tr UE  Neurological: She is alert.  Alert.  She is oriented to person, place and time. Motor: RUE: Shoulder abduction, elbow flex 3+/5, elbow extension, wrist extension 2/5, hand gip 0-tr/5 LUE: Shoulder abduction, elbow flexion 3-/5, wrist and hand 0-tr/5 B/l LE: tr to 1+/5 proximal to distal Sensation diminished to light touch tr/2 below nipple line, 1/2 hands/wrists--no changes today No resting tone in UE and LE Skin:  Incision c/d/i, scattered ecchymoses  Psychiatric: more ub beat today   Assessment/Plan: 1. Functional deficits secondary to C6 SCI, motor/sensory incomplete which require 3+ hours per day of interdisciplinary therapy in a comprehensive inpatient rehab  setting.  Physiatrist is providing close team supervision and 24 hour management of active medical problems listed below.  Physiatrist and rehab team continue to assess barriers to discharge/monitor patient progress toward functional and medical goals  Care Tool:  Bathing  Bathing activity did not occur: Refused Body parts bathed by patient: Right arm, Left arm, Chest, Left upper leg, Right upper leg, Abdomen   Body parts bathed by helper: Front perineal area, Buttocks, Right lower leg, Left lower leg, Face Body parts n/a: Face, Front perineal area, Buttocks(previously washed )   Bathing assist Assist Level: 2 Helpers     Upper Body Dressing/Undressing Upper body dressing   What is the patient wearing?: Pull over shirt    Upper body assist Assist Level: 2 Helpers    Lower Body Dressing/Undressing Lower body dressing      What is the patient wearing?: Incontinence brief, Pants     Lower body assist Assist for lower body dressing: 2 Helpers     Toileting Toileting    Toileting assist Assist for toileting: 2 Helpers     Transfers Chair/bed transfer  Transfers assist     Chair/bed transfer assist level: 2 Helpers     Locomotion Ambulation   Ambulation assist   Ambulation activity did not occur: Safety/medical concerns          Walk 10 feet activity   Assist  Walk 10 feet activity did not occur: Safety/medical concerns        Walk 50 feet activity  Assist Walk 50 feet with 2 turns activity did not occur: Safety/medical concerns         Walk 150 feet activity   Assist Walk 150 feet activity did not occur: Safety/medical concerns         Walk 10 feet on uneven surface  activity   Assist Walk 10 feet on uneven surfaces activity did not occur: Safety/medical concerns         Wheelchair     Assist Will patient use wheelchair at discharge?: Yes Type of Wheelchair: Power(TBD manual vs power) Wheelchair activity did not  occur: Safety/medical concerns         Wheelchair 50 feet with 2 turns activity    Assist    Wheelchair 50 feet with 2 turns activity did not occur: Safety/medical concerns       Wheelchair 150 feet activity     Assist Wheelchair 150 feet activity did not occur: Safety/medical concerns         Medical Problem List and Plan: 1.  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  Status post C5-6 and C6-7 anterior cervical discectomy and fusion 11/29/2017.  TLSO back brace when out of bed  -Continue CIR therapies including PT, OT  2.  DVT Prophylaxis/Anticoagulation:  Acute mobile DVT Right common/proximal femoral vein  -IVCF placed by radiology     3. Pain Management:   Neurontin increasedto 400mg  qid. Observe for tolerance  Valium 5 mg every 6 hours as needed muscle spasms  oxycodone as needed pain 4. Mood:   -ongoing ego support  -neuropsych   5. Neuropsych: This patient is capable of making decisions on her own behalf. 6. Skin/Wound Care: Routine skin checks 7. Fluids/Electrolytes/Nutrition: encourage PO  -replacing potassium  -  protein for hypoalbuminemia  -trial of megace---PO beginning to pick up 8.  Neurogenic bowel and bladder.  Flomax 0.4 mg daily.    -I/O caths    -working on AM bowel program, senna-s + HS, AM suppository   -hope to  Normalize with better PO 9.  Hypertension.  Norvasc 10 mg daily.   Vitals:   12/13/17 2028 12/14/17 0559  BP: (!) 108/58 114/65  Pulse: 87 76  Resp: 18 18  Temp: 98.3 F (36.8 C) 97.8 F (36.6 C)  SpO2: 94% 93%  Controlled 12/14/2017 10. Acute blood loss anemia: hgb 10.6 11/7    -follow up hgb 10.9  11/11    LOS: 7 days A FACE TO FACE EVALUATION WAS PERFORMED  Meredith Staggers 12/14/2017, 9:08 AM

## 2017-12-14 NOTE — Progress Notes (Signed)
Social Work Patient ID: Sherry [name], female   DOB: 04/10/1934, 83 y.o.   MRN: [id number]   Have reviewed team conference with pt and daughter. Both aware and agreeable with targeted LOS of 4 weeks and goals of moderate assist w/c level.  Daughter very involved and notes family is working to get home environment set up for w/c level mobility.  Neuropsychology following and pt feels this has been helpful.  Continue to follow.  Meher Kucinski, LCSW

## 2017-12-14 NOTE — Progress Notes (Addendum)
Physical Therapy Session Note  Patient Details  Name: Sherry [name] MRN: [id number] Date of Birth: 03/28/1934  Today's Date: 12/14/2017 PT Individual Time: 0900-1000 PT Individual Time Calculation (min): 60 min   Short Term Goals: Week 1:  PT Short Term Goal 1 (Week 1): Pt will maintain sitting balance EOB x 5 min with max A x 1 PT Short Term Goal 2 (Week 1): Pt will tolerate sitting up in chair x 1 hour PT Short Term Goal 3 (Week 1): Pt will complete bed mobility with max A x 1  Skilled Therapeutic Interventions/Progress Updates:    pt supine, with HOB raises.  After set-up, rolling L with total assist of 1.  R side lying> sitting with +2 assistance.  Pt sat EOB x 5 minutes with intermittent mod assist for balance, usually LOB R or backwards.  PT donned TLSO with pt in sitting.  Therapeutic exercises from w/c level, tilted back approx 10 degrees, and feet down, off of legrests, performed with bil UEs to increase strength for functional mobility: UBE at level 1 with hand splints to secure hands, x 10 revolutions forwards x 3/backwards x 1.  In w/c, R/L mass extension from (passive) mass flexion, x 10 each LE.  Pt reported hand discomfort after UBE.  PT offered emotional support and explained neurogenic pain.  Pt left resting in w/c, tilted back, bil UEs supported by pillows, LEs elevated,and soft call bell placed in hands. .  Family present.  Therapy Documentation Precautions:  Precautions Precautions: Cervical, Back, Fall Precaution Comments: back precautions due to thoracic fx s/p ACDF; needs TLSO for all OOB activity but per order set does not need cervical collar  Required Braces or Orthoses: Spinal Brace Spinal Brace: Thoracolumbosacral orthotic, Applied in sitting position Restrictions Weight Bearing Restrictions: No   Pain: Pain Assessment Pain Scale: 0-10 none Pain Intervention(s): Medication (See eMAR)    Therapy/Group: Individual  Therapy  Choua Chalker 12/14/2017, 12:31 PM

## 2017-12-14 NOTE — Progress Notes (Signed)
Physical Therapy Session Note  Patient Details  Name: Sherry [name] MRN: [id number] Date of Birth: 1934/08/03  Today's Date: 12/14/2017 PT Individual Time: 0900-1000 PT Individual Time Calculation (min): 60 min   Short Term Goals: Week 1:  PT Short Term Goal 1 (Week 1): Pt will maintain sitting balance EOB x 5 min with max A x 1 PT Short Term Goal 2 (Week 1): Pt will tolerate sitting up in chair x 1 hour PT Short Term Goal 3 (Week 1): Pt will complete bed mobility with max A x 1  Skilled Therapeutic Interventions/Progress Updates:    Pt received seated in bed, agreeable to PT. Pt reports 4/10 pain in B arms and that she just received pain medication, reports pain is well-controlled. Rolling L/R with total A and minimal ability to grasp bedrail with UE for dependent donning of pants. Total A x 2 for seated forward lean for dependent donning of shirt. Supine to sit with total A x 2. Dependent to don TLSO while seated EOB. Sliding board transfer bed to w/c with total A x 2 with 3" step under BLE. Pt exhibits better trunk control with transfer this date. Positioning in w/c with focus on BUE support and neck support with decreased forward head positioning. Focus on sitting in upright position in w/c, tolerates sitting with w/c in upright position x 1 min before onset of fatigue. Pt left reclined in TIS w/c with needs in reach and NT in room to assist back to bed for cathing.  Therapy Documentation Precautions:  Precautions Precautions: Cervical, Back, Fall Precaution Comments: back precautions due to thoracic fx s/p ACDF; needs TLSO for all OOB activity but per order set does not need cervical collar  Required Braces or Orthoses: Spinal Brace Spinal Brace: Thoracolumbosacral orthotic, Applied in sitting position Restrictions Weight Bearing Restrictions: No   Therapy/Group: Individual Therapy  Excell Seltzer, PT, DPT  12/14/2017, 10:50 AM

## 2017-12-15 ENCOUNTER — Inpatient Hospital Stay (HOSPITAL_COMMUNITY): Payer: Medicare HMO | Admitting: Physical Therapy

## 2017-12-15 ENCOUNTER — Inpatient Hospital Stay (HOSPITAL_COMMUNITY): Payer: Medicare HMO

## 2017-12-15 NOTE — Progress Notes (Signed)
Occupational Therapy Session Note  Patient Details  Name: Sherry [name] MRN: [id number] Date of Birth: [birth year]  Today's Date: 12/15/2017 OT Individual Time: 1303-1400 OT Individual Time Calculation (min): 57 min    Short Term Goals: Week 1:  OT Short Term Goal 1 (Week 1): Pt will perform bed mobility with maxA+2 as precursor to EOB/OOB ADL. OT Short Term Goal 2 (Week 1): Pt will perform UB bathing with maxA using AE PRN.  OT Short Term Goal 3 (Week 1): Pt will don overhead shirt with maxA using compensatory strategies PRN.   Skilled Therapeutic Interventions/Progress Updates:    1;1. Focus of session on phone accessibility to increase control of environment, contact with family to be able to request needs, as well as AE for stylus use to use RUE for NMR. OT sets up voice activation, increased touch sensivity of phone screen to pick up stylus as well as set up on screen home button to access various features of phone. Pt able to place phone call using voice with mod cues to recall steps of activating voice features. OT also sets up modular hose phone holder to allow pt to access phone using stylus while in TIS/bed. Pt would benefit from continued practice with styus to improve ease of access/strenghten RUE dring functional activity. Exite dsession with pt seate din TIS and sock touch call light in reach  Therapy Documentation Precautions:  Precautions Precautions: Cervical, Back, Fall Precaution Comments: back precautions due to thoracic fx s/p ACDF; needs TLSO for all OOB activity but per order set does not need cervical collar  Required Braces or Orthoses: Spinal Brace Spinal Brace: Thoracolumbosacral orthotic, Applied in sitting position Restrictions Weight Bearing Restrictions: No General:   Vital Signs:   Pain: Pain Assessment Pain Scale: 0-10 Pain Score: 7  Pain Type: Acute pain Pain Location: Hand Pain Orientation: Right;Left Pain Descriptors / Indicators:  Aching;Burning Pain Frequency: Constant Pain Onset: On-going Patients Stated Pain Goal: 4 Pain Intervention(s): Medication (See eMAR) ADL: ADL Eating: Maximal assistance Where Assessed-Eating: Bed level Grooming: Maximal assistance Where Assessed-Grooming: Bed level Upper Body Bathing: Maximal assistance Where Assessed-Upper Body Bathing: Bed level Lower Body Bathing: Maximal assistance Where Assessed-Lower Body Bathing: Bed level Upper Body Dressing: Maximal assistance Where Assessed-Upper Body Dressing: Bed level Lower Body Dressing: Not assessed(wearing hospital gown this session ) Vision   Perception    Praxis   Exercises:   Other Treatments:     Therapy/Group: Individual Therapy  Tonny Branch 12/15/2017, 2:01 PM

## 2017-12-15 NOTE — Progress Notes (Signed)
Nutrition Follow-up  INTERVENTION:   - Obtain new weight (last weight from 11/28/17)  - Continue MVI with minerals daily  - Continue afternoon snack daily  - Continue 30 ml Prostat BID, each supplement provides 100 kcals and 15 grams protein  NUTRITION DIAGNOSIS:   Inadequate oral intake related to poor appetite, early satiety as evidenced by per patient/family report.  Progressing  GOAL:   Patient will meet greater than or equal to 90% of their needs  Progressing  MONITOR:   PO intake, Labs, Skin, I & O's, Weight trends  REASON FOR ASSESSMENT:   Consult Poor PO  ASSESSMENT:   83 year old female with PMH significant for hypertension. Pt presented on 11/29/2017 after a fall with immediate quadriparesis. Cranial CT scan unremarkable for acute intracranial process. CT/MRI imaging showed C6-7 disc fracture and tear of anterior longitudinal ligament. Paraspinal muscle and interspinous edema from C1-C7 compatible ligament injury and muscle strain. Increased cord signal at C5 and C6. T11 coronal vertebral body fracture. Pt underwent C5-6, C6-7 anterior cervical discectomy and fusion on 11/29/2017 per Dr. Venetia Constable.  11/7 - s/p IVC filter placement  Spoke with pt at bedside who reports that her appetite and eating have improved. Pt continues to complaint of "tightness" around her abdomen from brace which she states is making it harder to eat and causes early satiety.  Pt reports having dry mouth and needing to drink a lot of water. Pt states that she slept very well last night due to being tired after therapies. Pt believes she is getting stronger.  Pt states that she is eating the snack provided and eats cookies from [employer] throughout the day. Pt continues to drink her shake from home.  Noted ~90% completed breakfast meal tray at bedside.  RD attempted to obtain new weight; however, pt in wheelchair at time of visit.  Meal Completion: 50-90% x last 4 meals  Medications  reviewed and include: Dulcolax, Pro-stat BID, Megace, liquid MVI, K-dur, Senokot-S  Labs reviewed: potassium 3.3 (L) - being repleted  NUTRITION - FOCUSED PHYSICAL EXAM:    Most Recent Value  Orbital Region  Moderate depletion  Upper Arm Region  No depletion  Thoracic and Lumbar Region  Unable to assess  Buccal Region  No depletion  Temple Region  Mild depletion  Clavicle Bone Region  No depletion  Clavicle and Acromion Bone Region  No depletion  Scapular Bone Region  Unable to assess  Dorsal Hand  No depletion  Patellar Region  No depletion  Anterior Thigh Region  No depletion  Posterior Calf Region  No depletion  Edema (RD Assessment)  Mild  Hair  Reviewed  Eyes  Reviewed  Mouth  Reviewed  Skin  Reviewed  Nails  Reviewed       Diet Order:   Diet Order            Diet regular Room service appropriate? Yes; Fluid consistency: Thin  Diet effective now              EDUCATION NEEDS:   Education needs have been addressed  Skin:  Skin Assessment: Skin Integrity Issues: DTI: right heel Incisions: left groin, neck Other: laceration to left elbow  Last BM:  11/14 (medium type 6)  Height:   Ht Readings from Last 1 Encounters:  11/28/17 5\' 7"  (1.702 m)    Weight:   Wt Readings from Last 1 Encounters:  11/28/17 78.9 kg    Ideal Body Weight:  61.36 kg  BMI:  There  is no height or weight on file to calculate BMI.  Estimated Nutritional Needs:   Kcal:  1500-1700  Protein:  80-95 grams  Fluid:  1.5-1.7 L    Gaynell Face, MS, RD, LDN Inpatient Clinical Dietitian Pager: [phone] Weekend/After Hours: [phone]

## 2017-12-15 NOTE — Progress Notes (Signed)
Physical Therapy Session Note  Patient Details  Name: Sherry [name] MRN: [id number] Date of Birth: [birth year]  Today's Date: 12/15/2017 PT Individual Time: 1400-1515 PT Individual Time Calculation (min): 75 min   Short Term Goals: Week 2:  PT Short Term Goal 1 (Week 2): Pt will utilize UE hooking to assist with bed mobility without cues in 50% of observations PT Short Term Goal 2 (Week 2): Pt will utilize BUE prop to maintain static sitting balance with mod assist 50% of observations PT Short Term Goal 3 (Week 2): Pt will initiate power w/c mobility training PT Short Term Goal 4 (Week 2): Pt will maintain forward weight shift during transfer in/out of w/c with min assist  Skilled Therapeutic Interventions/Progress Updates: Pt received seated in w/c, denies pain and agreeable to treatment. Transferred w/c <>mat table maxA +2 to mat, maxA +1 to return to w/c d/t improved trunk control and assist with weight shifting, head/hips relationship; stool under feet to support LEs at 90/90. Sitting balance with semi-reclined>neutral situps with visual/tactile cue of student PTs hands in front of pt's shoulders. Able to achieve midline sitting balance A/P and maintain ~5 second before posterior LOB. Provided tactile cueing/facilitation/stabilization at pelvis for anterior pelvic tilt. Sit <>supine maxA +2. Rolling R/L with cues for hand placement, head positioning; pt demo's reduced core/pelvic anterior elevation activation limiting lower trunk rotation and initiation. Supine adduction isometric, abduction isometric, bridging each 2 sets 5-10 reps each, LLE activation > RLE. BUE ergometer x4 min with hand splints before pt fatigued and requests to return to room. Remained in w/c semi-reclined with brother, son present and all needs in reach.      Therapy Documentation Precautions:  Precautions Precautions: Cervical, Back, Fall Precaution Comments: back precautions due to thoracic fx s/p ACDF; needs  TLSO for all OOB activity but per order set does not need cervical collar  Required Braces or Orthoses: Spinal Brace Spinal Brace: Thoracolumbosacral orthotic, Applied in sitting position Restrictions Weight Bearing Restrictions: No    Therapy/Group: Individual Therapy  Corliss Skains 12/15/2017, 3:19 PM

## 2017-12-15 NOTE — Progress Notes (Signed)
Physical Therapy Weekly Progress Note  Patient Details  Name: Sherry [name] MRN: [id number] Date of Birth: 04/09/1934  Beginning of progress report period: December 08, 2017 End of progress report period: December 15, 2017  Today's Date: 12/15/2017 PT Individual Time: 1000-1100 PT Individual Time Calculation (min): 60 min   Patient has met 2 of 3 short term goals.  Pt has made excellent progress towards LTG's this reporting period and is demonstrating muscle return in BUEs and LEs (RLE>LLE).  She continues to require +2 assist for all mobility, though is progressing in her ability to roll L/R in bed with +1 assist.  Patient continues to demonstrate the following deficits muscle weakness, muscle joint tightness and muscle paralysis, abnormal tone, unbalanced muscle activation and decreased coordination and decreased sitting balance, decreased standing balance, decreased postural control, decreased balance strategies and tetraplegia and therefore will continue to benefit from skilled PT intervention to increase functional independence with mobility.  Patient progressing toward long term goals..  Continue plan of care.  PT Short Term Goals Week 1:  PT Short Term Goal 1 (Week 1): Pt will maintain sitting balance EOB x 5 min with max A x 1 PT Short Term Goal 1 - Progress (Week 1): Met PT Short Term Goal 2 (Week 1): Pt will tolerate sitting up in chair x 1 hour PT Short Term Goal 2 - Progress (Week 1): Met PT Short Term Goal 3 (Week 1): Pt will complete bed mobility with max A x 1 PT Short Term Goal 3 - Progress (Week 1): Partly met Week 2:  PT Short Term Goal 1 (Week 2): Pt will utilize UE hooking to assist with bed mobility without cues in 50% of observations PT Short Term Goal 2 (Week 2): Pt will utilize BUE prop to maintain static sitting balance with mod assist 50% of observations PT Short Term Goal 3 (Week 2): Pt will initiate power w/c mobility training PT Short Term Goal 4 (Week 2): Pt  will maintain forward weight shift during transfer in/out of w/c with min assist  Skilled Therapeutic Interventions/Progress Updates:    no c/o pain at start of session, but reports slight increase in B hands by end of session, RN aware.  Session focus on increased independence with functional mobility and functional stretch.  Pt required +2 for all mobility this session.   Pt rolling L/R today with assist to position LEs into hook lying position and verbal cues for reaching/hooking UE.  LB dressing at bed level total assist. Supine>sit with HOB elevated.  Slide board transfers to L and R today with multimodal cues for head/hips relationship and forward weight shift.  Pt demos improved trunk stability during slide board transfer today compared to previous sessions.  Static sitting balance edge of mat with BUE support with mod/max assist and cues for maintaining forward weight shift and finding balance point.  Pt positioned in long sitting position for functional stretch, 3 trials of 45 seconds focus on forward weight shift to increase stretch with verbal cues for inhale/exhale.  Pt returned to room at end of session and positioned tilted back in chair with call bell in reach and needs met.   Therapy Documentation Precautions:  Precautions Precautions: Cervical, Back, Fall Precaution Comments: back precautions due to thoracic fx s/p ACDF; needs TLSO for all OOB activity but per order set does not need cervical collar  Required Braces or Orthoses: Spinal Brace Spinal Brace: Thoracolumbosacral orthotic, Applied in sitting position Restrictions Weight Bearing Restrictions: No  Therapy/Group: Individual Therapy  Michel Santee 12/15/2017, 11:09 AM

## 2017-12-15 NOTE — Progress Notes (Signed)
[facility] [facility] PHYSICAL MEDICINE & REHABILITATION PROGRESS NOTE   Subjective/Complaints: Slept soundly last night. Still sleeping when I came in. Had a good day with therapy. Pain seems to be under better control  ROS: Limited due to cognitive/behavioral .       Objective:   No results found. No results for input(s): WBC, HGB, HCT, PLT in the last 72 hours. No results for input(s): NA, K, CL, CO2, GLUCOSE, BUN, CREATININE, CALCIUM in the last 72 hours.  Intake/Output Summary (Last 24 hours) at 12/15/2017 1005 Last data filed at 12/15/2017 0315 Gross per 24 hour  Intake 560 ml  Output 3130 ml  Net -2570 ml     Physical Exam: Vital Signs Blood pressure 116/65, pulse 82, temperature 98 F (36.7 C), resp. rate 18, SpO2 93 %.  Constitutional: No distress . Vital signs reviewed. HEENT: EOMI, oral membranes moist Neck: supple Cardiovascular: RRR without murmur. No JVD    Respiratory: CTA Bilaterally without wheezes or rales. Normal effort    GI: BS +, non-tender, non-distended  Musculoskeletal:  Dependent tr to 1+ edema LE's, tr UE  Neurological: asleep, slow to arouse. Motor:  Sensation diminished to light touch tr/2 below nipple line, 1/2 hands/wrists-  No resting tone in UE and LE Skin:  Incision c/d/i, scattered ecchymoses  Psychiatric: more ub beat today   Assessment/Plan: 1. 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  Physiatrist is providing close team supervision and 24 hour management of active medical problems listed below.  Physiatrist and rehab team continue to assess barriers to discharge/monitor patient progress toward functional and medical goals  Care Tool:  Bathing  Bathing activity did not occur: Refused Body parts bathed by patient: Right arm, Left arm, Chest, Left upper leg, Right upper leg, Abdomen   Body parts bathed by  helper: Front perineal area, Buttocks, Right lower leg, Left lower leg, Face Body parts n/a: Face, Front perineal area, Buttocks(previously washed )   Bathing assist Assist Level: 2 Helpers     Upper Body Dressing/Undressing Upper body dressing   What is the patient wearing?: Pull over shirt    Upper body assist Assist Level: 2 Helpers    Lower Body Dressing/Undressing Lower body dressing      What is the patient wearing?: Incontinence brief, Pants     Lower body assist Assist for lower body dressing: 2 Helpers     Toileting Toileting    Toileting assist Assist for toileting: 2 Helpers     Transfers Chair/bed transfer  Transfers assist     Chair/bed transfer assist level: 2 Helpers(slide board)     Locomotion Ambulation   Ambulation assist   Ambulation activity did not occur: Safety/medical concerns          Walk 10 feet activity   Assist  Walk 10 feet activity did not occur: Safety/medical concerns        Walk 50 feet activity   Assist Walk 50 feet with 2 turns activity did not occur: Safety/medical concerns         Walk 150 feet activity   Assist Walk 150 feet activity did not occur: Safety/medical concerns         Walk 10 feet on uneven surface  activity   Assist Walk 10 feet on uneven surfaces activity did not occur: Safety/medical concerns         Wheelchair     Assist  Will patient use wheelchair at discharge?: Yes Type of Wheelchair: Power(TBD manual vs power) Wheelchair activity did not occur: Safety/medical concerns         Wheelchair 50 feet with 2 turns activity    Assist    Wheelchair 50 feet with 2 turns activity did not occur: Safety/medical concerns       Wheelchair 150 feet activity     Assist Wheelchair 150 feet activity did not occur: Safety/medical concerns         Medical Problem List and Plan: 1.  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  Status post C5-6 and C6-7 anterior cervical discectomy and fusion 11/29/2017.  TLSO back brace when out of bed  -Continue CIR therapies including PT, OT  2.  DVT Prophylaxis/Anticoagulation:  Acute mobile DVT Right common/proximal femoral vein  -IVCF placed by radiology     3. Pain Management:   Neurontin increased to 400mg  qid.    -seems to be tolerating  Valium 5 mg every 6 hours as needed muscle spasms  oxycodone as needed pain 4. Mood:   -ongoing ego support  -neuropsych   5. Neuropsych: This patient is capable of making decisions on her own behalf. 6. Skin/Wound Care: Routine skin checks 7. Fluids/Electrolytes/Nutrition: encourage PO  -replacing potassium  -  protein for hypoalbuminemia  -trial of megace---PO improving 8.  Neurogenic bowel and bladder.  Flomax 0.4 mg daily.    -I/O caths    -getting closer to AM bowel program, senna-s + HS, AM suppository  9.  Hypertension.  Norvasc 10 mg daily.   Vitals:   12/14/17 1953 12/15/17 0319  BP: 110/63 116/65  Pulse: 98 82  Resp: 16 18  Temp: 99 F (37.2 C) 98 F (36.7 C)  SpO2: 96% 93%  Controlled 12/15/17 10. Acute blood loss anemia: hgb 10.6 11/7    -follow up hgb 10.9  11/11    LOS: 8 days A FACE TO FACE EVALUATION WAS PERFORMED  Meredith Staggers 12/15/2017, 10:05 AM

## 2017-12-16 ENCOUNTER — Inpatient Hospital Stay (HOSPITAL_COMMUNITY): Payer: Medicare HMO | Admitting: Occupational Therapy

## 2017-12-16 ENCOUNTER — Inpatient Hospital Stay (HOSPITAL_COMMUNITY): Payer: Medicare HMO | Admitting: Physical Therapy

## 2017-12-16 LAB — BASIC METABOLIC PANEL
Anion gap: 9 (ref 5–15)
BUN: 12 mg/dL (ref 8–23)
CHLORIDE: 103 mmol/L (ref 98–111)
CO2: 24 mmol/L (ref 22–32)
Calcium: 8.2 mg/dL — ABNORMAL LOW (ref 8.9–10.3)
Creatinine, Ser: 0.77 mg/dL (ref 0.44–1.00)
GFR calc non Af Amer: >60 mL/min (ref >60)
Glucose, Bld: 114 mg/dL — ABNORMAL HIGH (ref 70–99)
Potassium: 3.8 mmol/L (ref 3.5–5.1)
SODIUM: 136 mmol/L (ref 135–145)

## 2017-12-16 MED ORDER — SENNOSIDES-DOCUSATE SODIUM 8.6-50 MG PO TABS
1.0000 | ORAL_TABLET | Freq: Every day | ORAL | Status: DC
  Administered 2017-12-16 – 2017-12-20 (×5): 1 via ORAL
  Filled 2017-12-16 (×5): qty 1

## 2017-12-16 NOTE — Progress Notes (Addendum)
Physical Therapy Session Note  Patient Details  Name: Sherry [name] MRN: [id number] Date of Birth: 07-12-1934  Today's Date: 12/16/2017 PT Individual Time: 1315-1415 PT Individual Time Calculation (min): 60 min   Short Term Goals: Week 2:  PT Short Term Goal 1 (Week 2): Pt will utilize UE hooking to assist with bed mobility without cues in 50% of observations PT Short Term Goal 2 (Week 2): Pt will utilize BUE prop to maintain static sitting balance with mod assist 50% of observations PT Short Term Goal 3 (Week 2): Pt will initiate power w/c mobility training PT Short Term Goal 4 (Week 2): Pt will maintain forward weight shift during transfer in/out of w/c with min assist  Skilled Therapeutic Interventions/Progress Updates: Pt received in w/c with nursing preparing to I&O cath. Transferred pt back to bed with transfer board and maxA, +2 to stabilize equipment. Sit<>supine totalA +2. Missed 15 min d/t nursing care. Upon therapists return pt c/o pain as below, medicated during session, and agreeable to treatment. Rolling R/L to don pants. Seated EOB pt demos unsupported sitting balance with close S x30 sec, 2 trials. Returned to w/c as above. Pt maneuvered power w/c throughout unit with moderate cueing for technique and safety. Adjusted arm rests and foot plate for optimal alignment. Pt unable to verbalize goals of pressure relief; reviewed recommendation for q 30 min pressure relief for 2 min at a time, demo'ed use power w/c features to perform independently; will require ongoing practice and education as pt unable to turn on/off chair safely d/t poor fine motor control. Pt maneuvered through cones obstacle course with moderate cueing, hit 2 of 4 cones d/t turning early. Handoff to OT at end of session, all needs in reach.      Therapy Documentation Precautions:  Precautions Precautions: Cervical, Back, Fall Precaution Comments: back precautions due to thoracic fx s/p ACDF; needs TLSO for all  OOB activity but per order set does not need cervical collar  Required Braces or Orthoses: Spinal Brace Spinal Brace: Thoracolumbosacral orthotic, Applied in sitting position Restrictions Weight Bearing Restrictions: No General: PT Amount of Missed Time (min): 15 Minutes PT Missed Treatment Reason: Nursing care Pain: Pain Assessment Pain Scale: 0-10 Pain Score: 7  Pain Type: Acute pain Pain Location: Hand Pain Orientation: Right;Left Pain Radiating Towards: (arms) Pain Descriptors / Indicators: Aching;Burning Pain Frequency: Constant Pain Onset: On-going Patients Stated Pain Goal: 4 Pain Intervention(s): Medication (See eMAR)    Therapy/Group: Individual Therapy  Corliss Skains 12/16/2017, 2:44 PM

## 2017-12-16 NOTE — Progress Notes (Signed)
Occupational Therapy Session Note  Patient Details  Name: Sherry [name] MRN: [id number] Date of Birth: [birth year]  Today's Date: 12/16/2017 OT Individual Time: 1425-1545 OT Individual Time Calculation (min): 80 min   Short Term Goals: Week 1:  OT Short Term Goal 1 (Week 1): Pt will perform bed mobility with maxA+2 as precursor to EOB/OOB ADL. OT Short Term Goal 2 (Week 1): Pt will perform UB bathing with maxA using AE PRN.  OT Short Term Goal 3 (Week 1): Pt will don overhead shirt with maxA using compensatory strategies PRN.   Skilled Therapeutic Interventions/Progress Updates:    Pt greeted via PT handoff in hallway. Agreeable to tx. She propelled PWC to dayroom with Min A and cuing for turns. Worked on proximal strengthening and distal AA/PROM for B UEs. Pt able to resist 3+/5 force in gravity minimized planes when pushing/pulling on OT's arm. Able to sustain muscle contractions for 4-7 second windows before fatiguing. She completed 20 reps with each UE. During rest periods, pt able to form grasp around beverage to bring water to mouth unassisted (using B UEs). Discussed benefits of mental practice for evidence-based neuro re-ed that she could do on her own in the room. Pt appeared receptive to this education. During PROM, pt with limited digit flexion Lt>Rt. We reviewed using wrist/forearm for self ROM to improve this range. Pt reports she does many self ROM exercises in the day. OT encouraged her to continue doing so for contracture mgt and promotion of functional return. At end of session she propelled PWC back to room, weaving around OT with min vcs and increased time. She was assisted with PWC positioning in room, and we set up call bell against belly so that she could press it when UEs were under k-pad. Pt was reclined for comfort and pressure relief before time of departure.     Therapy Documentation Precautions:  Precautions Precautions: Cervical, Back, Fall Precaution Comments:  back precautions due to thoracic fx s/p ACDF; needs TLSO for all OOB activity but per order set does not need cervical collar  Required Braces or Orthoses: Spinal Brace Spinal Brace: Thoracolumbosacral orthotic, Applied in sitting position Restrictions Weight Bearing Restrictions: No Vital Signs: Therapy Vitals Pulse Rate: 90 Resp: 16 BP: 124/69 Oxygen Therapy SpO2: 97 % Pain: Notified RN of pts request for pain medicine at end of session.    ADL: ADL Eating: Maximal assistance Where Assessed-Eating: Bed level Grooming: Maximal assistance Where Assessed-Grooming: Bed level Upper Body Bathing: Maximal assistance Where Assessed-Upper Body Bathing: Bed level Lower Body Bathing: Maximal assistance Where Assessed-Lower Body Bathing: Bed level Upper Body Dressing: Maximal assistance Where Assessed-Upper Body Dressing: Bed level Lower Body Dressing: Not assessed(wearing hospital gown this session )      Therapy/Group: Individual Therapy  Dwight Adamczak A Stevenson Windmiller 12/16/2017, 4:15 PM

## 2017-12-16 NOTE — Progress Notes (Signed)
Occupational Therapy Session Note  Patient Details  Name: Sherry [name] MRN: [id number] Date of Birth: March 07, 1934  Today's Date: 12/16/2017 OT Individual Time: 1130-1208 OT Individual Time Calculation (min): 38 min    Short Term Goals: Week 1:  OT Short Term Goal 1 (Week 1): Pt will perform bed mobility with maxA+2 as precursor to EOB/OOB ADL. OT Short Term Goal 2 (Week 1): Pt will perform UB bathing with maxA using AE PRN.  OT Short Term Goal 3 (Week 1): Pt will don overhead shirt with maxA using compensatory strategies PRN.       Skilled Therapeutic Interventions/Progress Updates:     Pt seen this session to facilitate self feeding skills.  Pt repositioned in wc to adjust posture as she was leaning to her L and brace readjusted.  Pt received a sandwich for her lunch.  Cut sandwich into finger food size pieces. Pt has enough of a tip pinch to hold sandwich and can bring to her mouth. Pt ate 1/3 of sandwich this way.  Because sandwich full of ingredients that kept falling out, turned it into a salad. Pt then used wrist cuff with spoon and was able to scoop the food to her mouth.  She did need mod A to hold cup and bring to her mouth.  Pt's NT arrived to continue working with her as she had not finished eating.    Therapy Documentation Precautions:  Precautions Precautions: Cervical, Back, Fall Precaution Comments: back precautions due to thoracic fx s/p ACDF; needs TLSO for all OOB activity but per order set does not need cervical collar  Required Braces or Orthoses: Spinal Brace Spinal Brace: Thoracolumbosacral orthotic, Applied in sitting position Restrictions Weight Bearing Restrictions: No   Pain: Pain Assessment Pain Scale: 0-10 Pain Score: 7  Pain Type: Acute pain Pain Location: Hand Pain Orientation: Right;Left Pain Radiating Towards: (arms) Pain Descriptors / Indicators: Aching;Burning Pain Frequency: Constant Pain Onset: On-going Patients Stated Pain Goal: 4 Pain  Intervention(s): Medication (See eMAR) ADL: ADL Eating: Maximal assistance Where Assessed-Eating: Bed level Grooming: Maximal assistance Where Assessed-Grooming: Bed level Upper Body Bathing: Maximal assistance Where Assessed-Upper Body Bathing: Bed level Lower Body Bathing: Maximal assistance Where Assessed-Lower Body Bathing: Bed level Upper Body Dressing: Maximal assistance Where Assessed-Upper Body Dressing: Bed level Lower Body Dressing: Not assessed(wearing hospital gown this session )   Therapy/Group: Individual Therapy  Kathaleen Dudziak 12/16/2017, 12:49 PM

## 2017-12-16 NOTE — Progress Notes (Signed)
Occupational Therapy Weekly Progress Note  Patient Details  Name: Sherry [name] MRN: [id number] Date of Birth: [birth year]  Beginning of progress report period: 12/08/17 End of progress report period: 12/16/17  Patient has met 2 of 3 short term goals.    Pt has made progress during this report period. Her proximal B UE strength is increasing, where pt can self feed with supervision using AE. She exhibits active return in wrist, which she did not have last week(!). Pt at present requires +2 assist for functional transfers using slideboard and for bed mobility during self care tasks. She continues to remain motivated to participate in therapy. Her dtr [name] is often present during sessions to observe, help out, and ask appropriate questions. Continue POC.    Patient continues to demonstrate the following deficits: muscle weakness and muscle paralysis, decreased cardiorespiratoy endurance, abnormal tone, unbalanced muscle activation and decreased coordination and decreased sitting balance, decreased standing balance, decreased postural control, decreased balance strategies and difficulty maintaining precautions and therefore will continue to benefit from skilled OT intervention to enhance overall performance with BADL.  Patient progressing toward long term goals..  Continue plan of care.  OT Short Term Goals Week 1:  OT Short Term Goal 1 (Week 1): Pt will perform bed mobility with maxA+2 as precursor to EOB/OOB ADL. OT Short Term Goal 1 - Progress (Week 1): Met OT Short Term Goal 2 (Week 1): Pt will perform UB bathing with maxA using AE PRN.  OT Short Term Goal 2 - Progress (Week 1): Met OT Short Term Goal 3 (Week 1): Pt will don overhead shirt with maxA using compensatory strategies PRN.  OT Short Term Goal 3 - Progress (Week 1): Progressing toward goal Week 2:  OT Short Term Goal 1 (Week 2): Pt will complete 1 grooming task with AE and Min A OT Short Term Goal 2 (Week 2): Pt will complete  UB dressing with Max A  OT Short Term Goal 3 (Week 2): Pts dtr will demonstrate carryover of pressure relief education by assisting pt with position changes during daily routine    Therapy Documentation Precautions:  Precautions Precautions: Cervical, Back, Fall Precaution Comments: back precautions due to thoracic fx s/p ACDF; needs TLSO for all OOB activity but per order set does not need cervical collar  Required Braces or Orthoses: Spinal Brace Spinal Brace: Thoracolumbosacral orthotic, Applied in sitting position Restrictions Weight Bearing Restrictions: No General: General PT Missed Treatment Reason: Nursing care Vital Signs: Therapy Vitals Temp: (!) 97.2 F (36.2 C) Temp Source: Oral Pulse Rate: 90 Resp: 16 BP: 124/69 Oxygen Therapy SpO2: 97 % O2 Device: Room Air Pain:   ADL: ADL Equipment Provided: Other (comment)(dorsal wrist cuff) Eating: Supervision/safety Where Assessed-Eating: Wheelchair Grooming: Not assessed Where Assessed-Grooming: Bed level Upper Body Bathing: Moderate assistance Where Assessed-Upper Body Bathing: Bed level Lower Body Bathing: Other (comment), Maximal assistance(2 helpers) Where Assessed-Lower Body Bathing: Bed level Upper Body Dressing: Maximal assistance, Other (Comment)(2 helpers) Where Assessed-Upper Body Dressing: Bed level Lower Body Dressing: Maximal assistance, Other (Comment)(2 helpers) Where Assessed-Lower Body Dressing: Bed level Toileting: Not assessed Toilet Transfer: Not assessed Walk-In Shower Transfer: Not assessed      Therapy/Group: Individual Therapy  Mayzee Reichenbach A Morton Simson 12/16/2017, 5:26 PM

## 2017-12-16 NOTE — Progress Notes (Signed)
[place] PHYSICAL MEDICINE & REHABILITATION PROGRESS NOTE   Subjective/Complaints: Slept well again. Overall pain is better, seems to be coping with it better as well.   ROS: Patient denies fever, rash, sore throat, blurred vision, nausea, vomiting, diarrhea, cough, shortness of breath or chest pain, joint or back pain, headache, or mood change.       Objective:   No results found. No results for input(s): WBC, HGB, HCT, PLT in the last 72 hours. Recent Labs    12/16/17 0433  NA 136  K 3.8  CL 103  CO2 24  GLUCOSE 114*  BUN 12  CREATININE 0.77  CALCIUM 8.2*    Intake/Output Summary (Last 24 hours) at 12/16/2017 0923 Last data filed at 12/16/2017 0900 Gross per 24 hour  Intake 480 ml  Output 2250 ml  Net -1770 ml     Physical Exam: Vital Signs Blood pressure 123/60, pulse 88, temperature 98.2 F (36.8 C), resp. rate 18, SpO2 95 %.  Constitutional: No distress . Vital signs reviewed. HEENT: EOMI, oral membranes moist Neck: supple Cardiovascular: RRR without murmur. No JVD    Respiratory: CTA Bilaterally without wheezes or rales. Normal effort    GI: BS +, non-tender, non-distended  Musculoskeletal:  Dependent tr  edema LE's, tr UE  Neurological: asleep, slow to arouse. Motor:  Sensation diminished to light touch tr/2 below nipple line, 1/2 hands/wrists- --no changes today No resting tone in UE and LE Skin:  Incision c/d/i, scattered ecchymoses on arms---resolving Psychiatric: pleasant  Assessment/Plan: 1. 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  Physiatrist is providing close team supervision and 24 hour management of active medical problems listed below.  Physiatrist and rehab team continue to assess barriers to discharge/monitor patient progress toward functional and medical goals  Care Tool:  Bathing  Bathing activity  did not occur: Refused Body parts bathed by patient: Right arm, Left arm, Chest, Left upper leg, Right upper leg, Abdomen   Body parts bathed by helper: Front perineal area, Buttocks, Right lower leg, Left lower leg, Face Body parts n/a: Face, Front perineal area, Buttocks(previously washed )   Bathing assist Assist Level: 2 Helpers     Upper Body Dressing/Undressing Upper body dressing   What is the patient wearing?: Pull over shirt    Upper body assist Assist Level: 2 Helpers    Lower Body Dressing/Undressing Lower body dressing      What is the patient wearing?: Pants     Lower body assist Assist for lower body dressing: 2 Helpers     Toileting Toileting    Toileting assist Assist for toileting: 2 Helpers     Transfers Chair/bed transfer  Transfers assist     Chair/bed transfer assist level: 2 Helpers     Locomotion Ambulation   Ambulation assist   Ambulation activity did not occur: Safety/medical concerns          Walk 10 feet activity   Assist  Walk 10 feet activity did not occur: Safety/medical concerns        Walk 50 feet activity   Assist Walk 50 feet with 2 turns activity did not occur: Safety/medical concerns         Walk 150 feet activity   Assist Walk 150 feet activity did not occur: Safety/medical concerns         Walk 10 feet on uneven surface  activity   Assist  Walk 10 feet on uneven surfaces activity did not occur: Safety/medical concerns         Wheelchair     Assist Will patient use wheelchair at discharge?: Yes Type of Wheelchair: Power(TBD manual vs power) Wheelchair activity did not occur: Safety/medical concerns         Wheelchair 50 feet with 2 turns activity    Assist    Wheelchair 50 feet with 2 turns activity did not occur: Safety/medical concerns       Wheelchair 150 feet activity     Assist Wheelchair 150 feet activity did not occur: Safety/medical concerns          Medical Problem List and Plan: 1.  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  Status post C5-6 and C6-7 anterior cervical discectomy and fusion 11/29/2017.  TLSO back brace when out of bed  --Continue CIR therapies including PT, OT   2.  DVT Prophylaxis/Anticoagulation:  Acute mobile DVT Right common/proximal femoral vein  -IVCF placed by radiology     3. Pain Management:   Neurontin increased to 400mg  qid with improvement. Not oversedated    Valium 5 mg every 6 hours as needed muscle spasms  oxycodone as needed pain 4. Mood:   -ongoing ego support  -neuropsych   5. Neuropsych: This patient is capable of making decisions on her own behalf. 6. Skin/Wound Care: Routine skin checks 7. Fluids/Electrolytes/Nutrition: encourage PO  -replacing potassium  - protein for hypoalbuminemia  -trial of megace---PO improving  -recheck labs monday 8.  Neurogenic bowel and bladder.  Flomax 0.4 mg daily.    -I/O caths    -getting close to consistent AM bowel program, senna-s + HS, AM suppository. Will reduce senna-s to firm up stool  9.  Hypertension.  Norvasc 10 mg daily.   Vitals:   12/15/17 2037 12/16/17 0600  BP: 124/69 123/60  Pulse: 98 88  Resp: 18 18  Temp: 97.9 F (36.6 C) 98.2 F (36.8 C)  SpO2: 95% 95%  Controlled 12/16/17 10. Acute blood loss anemia: hgb 10.6 11/7    -follow up hgb 10.9  11/11   -follow up monday  LOS: 9 days A FACE TO FACE EVALUATION WAS PERFORMED  Meredith Staggers 12/16/2017, 9:23 AM

## 2017-12-16 NOTE — Progress Notes (Signed)
Physical Therapy Session Note  Patient Details  Name: Sherry [name] MRN: [id number] Date of Birth: 01/17/1935  Today's Date: 12/16/2017 PT Individual Time: 1000-1100 PT Individual Time Calculation (min): 60 min   Short Term Goals: Week 2:  PT Short Term Goal 1 (Week 2): Pt will utilize UE hooking to assist with bed mobility without cues in 50% of observations PT Short Term Goal 2 (Week 2): Pt will utilize BUE prop to maintain static sitting balance with mod assist 50% of observations PT Short Term Goal 3 (Week 2): Pt will initiate power w/c mobility training PT Short Term Goal 4 (Week 2): Pt will maintain forward weight shift during transfer in/out of w/c with min assist  Skilled Therapeutic Interventions/Progress Updates:    c/o pain as below.  Session focus on functional mobility and w/c mobility.  +2 assist for LB dressing and all transfers today.  Pt requires max assist for rolling L/R for second person to pull pants over hips.  Pt continues to demo improved trunk control and forward weight shift with slide board transfer, but remains somewhat limited by TLSO.  Pt completes 3 transfers throughout session.  PT positioned pt in power w/c for improved independence with mobility and to allow for independence with positioning to increase sitting tolerance.  W/C mobility with min assist overall to maintain straight pathway and navigate around obstacles.  Pt left upright in w/c with call bell in reach and needs met.   Therapy Documentation Precautions:  Precautions Precautions: Cervical, Back, Fall Precaution Comments: back precautions due to thoracic fx s/p ACDF; needs TLSO for all OOB activity but per order set does not need cervical collar  Required Braces or Orthoses: Spinal Brace Spinal Brace: Thoracolumbosacral orthotic, Applied in sitting position Restrictions Weight Bearing Restrictions: No Pain: Pain Assessment Pain Scale: 0-10 Pain Score: 7  Pain Type: Acute pain Pain  Location: Hand Pain Orientation: Right;Left Pain Radiating Towards: (arms) Pain Descriptors / Indicators: Aching;Burning Pain Frequency: Constant Pain Onset: On-going Patients Stated Pain Goal: 4 Pain Intervention(s): Medication (See eMAR)    Therapy/Group: Individual Therapy  Caitlin E Warren 12/16/2017, 11:48 AM  

## 2017-12-17 ENCOUNTER — Inpatient Hospital Stay (HOSPITAL_COMMUNITY): Payer: Medicare HMO | Admitting: Physical Therapy

## 2017-12-17 DIAGNOSIS — O2230 Deep phlebothrombosis in pregnancy, unspecified trimester: Secondary | ICD-10-CM

## 2017-12-17 DIAGNOSIS — I10 Essential (primary) hypertension: Secondary | ICD-10-CM

## 2017-12-17 DIAGNOSIS — D72829 Elevated white blood cell count, unspecified: Secondary | ICD-10-CM

## 2017-12-17 DIAGNOSIS — D62 Acute posthemorrhagic anemia: Secondary | ICD-10-CM

## 2017-12-17 NOTE — Progress Notes (Signed)
Physical Therapy Session Note  Patient Details  Name: Sherry [name] MRN: [id number] Date of Birth: January 21, 1935  Today's Date: 12/17/2017 PT Individual Time: 1415-1530 PT Individual Time Calculation (min): 75 min   Short Term Goals: Week 2:  PT Short Term Goal 1 (Week 2): Pt will utilize UE hooking to assist with bed mobility without cues in 50% of observations PT Short Term Goal 2 (Week 2): Pt will utilize BUE prop to maintain static sitting balance with mod assist 50% of observations PT Short Term Goal 3 (Week 2): Pt will initiate power w/c mobility training PT Short Term Goal 4 (Week 2): Pt will maintain forward weight shift during transfer in/out of w/c with min assist  Skilled Therapeutic Interventions/Progress Updates:    no c/o pain at start of session, but requesting medicine for B hand pain by end of session.  Focus on functional mobility and compensatory strategies for balance.    PT donned shoes total assist.  Supine>sit focus on use of UEs to assist but still requiring +2.  Slide board transfers x3 throughout session to L and R, +2 assist, pt able to maintain forward weight shift and UE positioning better today.  Seated edge of mat pt engaged in horse shoe activity focus on core activation and use of UEs/head to assist with forward weight shift 4x5 reps with each UE.  Rest break between each set.  Pt requiring range from supervision<>max assist for forward weight shift pending fatigue levels, and frequent cues for use of head to assist with weight shifting.  Returned to room in w/c at end of session, supervision for w/c propulsion.  Positioned tilted back with call bell in reach and needs met.   Therapy Documentation Precautions:  Precautions Precautions: Cervical, Back, Fall Precaution Comments: back precautions due to thoracic fx s/p ACDF; needs TLSO for all OOB activity but per order set does not need cervical collar  Required Braces or Orthoses: Spinal Brace Spinal Brace:  Thoracolumbosacral orthotic, Applied in sitting position Restrictions Weight Bearing Restrictions: No    Therapy/Group: Individual Therapy  Michel Santee 12/17/2017, 3:36 PM

## 2017-12-17 NOTE — Progress Notes (Signed)
PHYSICAL MEDICINE & REHABILITATION PROGRESS NOTE   Subjective/Complaints: Patient seen sitting up in bed this AM.  She states she slept well overnight.    ROS: Denies CP, SOB, N/V/D  Objective:   No results found. No results for input(s): WBC, HGB, HCT, PLT in the last 72 hours. Recent Labs    12/16/17 0433  NA 136  K 3.8  CL 103  CO2 24  GLUCOSE 114*  BUN 12  CREATININE 0.77  CALCIUM 8.2*    Intake/Output Summary (Last 24 hours) at 12/17/2017 0805 Last data filed at 12/17/2017 0640 Gross per 24 hour  Intake 700 ml  Output 3500 ml  Net -2800 ml     Physical Exam: Vital Signs Blood pressure 121/62, pulse 81, temperature 98.3 F (36.8 C), resp. rate 16, SpO2 96 %. Constitutional: No distress . Vital signs reviewed. HENT: Normocephalic.  Atraumatic. Eyes: EOMI. No discharge. Cardiovascular: RRR. No JVD. Respiratory: CTA Bilaterally. Normal effort. GI: BS +. Non-distended. Musc: No edema or tenderness in extremities. Musculoskeletal: Mild edema  Neurological: Alert and oriented. Sensation diminished to light touch below T4 Motor: RUE: Shoulder abduction, elbow flex 3+/5, elbow extension, wrist extension 2-/5, hand gip 1+/5 LUE: Shoulder abduction, elbow flexion 3-/5, wrist extension 2-/5, hand gip 1+/5 B/l LE: 1+/5 proximal to distal Skin: Incision c/d/i, warm and dry. Psychiatric: Normal mood.  Normal behavior.  Assessment/Plan: 1. 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  Physiatrist is providing close team supervision and 24 hour management of active medical problems listed below.  Physiatrist and rehab team continue to assess barriers to discharge/monitor patient progress toward functional and medical goals  Care Tool:  Bathing  Bathing activity did not occur: Refused Body parts bathed by patient: Right arm, Left  arm, Chest, Left upper leg, Right upper leg, Abdomen   Body parts bathed by helper: Front perineal area, Buttocks, Right lower leg, Left lower leg, Face Body parts n/a: Face, Front perineal area, Buttocks(previously washed )   Bathing assist Assist Level: 2 Helpers     Upper Body Dressing/Undressing Upper body dressing   What is the patient wearing?: Pull over shirt    Upper body assist Assist Level: 2 Helpers    Lower Body Dressing/Undressing Lower body dressing      What is the patient wearing?: Pants     Lower body assist Assist for lower body dressing: 2 Helpers     Toileting Toileting    Toileting assist Assist for toileting: 2 Helpers     Transfers Chair/bed transfer  Transfers assist     Chair/bed transfer assist level: 2 Helpers     Locomotion Ambulation   Ambulation assist   Ambulation activity did not occur: Safety/medical concerns          Walk 10 feet activity   Assist  Walk 10 feet activity did not occur: Safety/medical concerns        Walk 50 feet activity   Assist Walk 50 feet with 2 turns activity did not occur: Safety/medical concerns         Walk 150 feet activity   Assist Walk 150 feet activity did not occur: Safety/medical concerns         Walk 10 feet on uneven surface  activity   Assist Walk 10 feet on uneven surfaces activity did not occur: Safety/medical concerns         Wheelchair  Assist Will patient use wheelchair at discharge?: Yes Type of Wheelchair: Power Wheelchair activity did not occur: Safety/medical concerns  Wheelchair assist level: Minimal Assistance - Patient > 75% Max wheelchair distance: 100    Wheelchair 50 feet with 2 turns activity    Assist    Wheelchair 50 feet with 2 turns activity did not occur: Safety/medical concerns   Assist Level: Minimal Assistance - Patient > 75%   Wheelchair 150 feet activity     Assist Wheelchair 150 feet activity did not  occur: Safety/medical concerns         Medical Problem List and Plan: 1.  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  Status post C5-6 and C6-7 anterior cervical discectomy and fusion 11/29/2017.  TLSO back brace when out of bed  --Continue CIR   2.  DVT Prophylaxis/Anticoagulation:  Acute mobile DVT Right common/proximal femoral vein  -IVCF placed by radiology  3. Pain Management:   Neurontin increased to 400mg  qid with improvement. Not oversedated    Valium 5 mg every 6 hours as needed muscle spasms  oxycodone as needed pain 4. Mood:   -ongoing ego support  -neuropsych   5. Neuropsych: This patient is capable of making decisions on her own behalf. 6. Skin/Wound Care: Routine skin checks 7. Fluids/Electrolytes/Nutrition: encourage PO  -replacing potassium  - protein for hypoalbuminemia  -trial of megace---PO improving, consider DC  -recheck labs monday 8.  Neurogenic bowel and bladder.  Flomax 0.4 mg daily.    -I/O caths    -AM bowel program, senna-s + HS, AM suppository. Reduced senna-s to firm up stool  9.  Hypertension.  Norvasc 10 mg daily.   Vitals:   12/16/17 2152 12/17/17 0704  BP: 118/67 121/62  Pulse: 88 81  Resp: 18 16  Temp: 98.4 F (36.9 C) 98.3 F (36.8 C)  SpO2: 94% 96%   Controlled on 11/16 10. Acute blood loss anemia:     -hgb 10.9  11/11   -follow up Monday 11.  Leukocytosis  WBCs 11.6 on 11/11  Afebrile  Labs ordered for Monday  LOS: 10 days A FACE TO FACE EVALUATION WAS PERFORMED  Joette Schmoker Lorie Phenix 12/17/2017, 8:05 AM

## 2017-12-17 NOTE — Plan of Care (Signed)
  Problem: SCI BLADDER ELIMINATION Goal: RH STG MANAGE BLADDER WITH MEDICATION WITH ASSISTANCE Description STG Manage Bladder With Medication With  Total Assistance.  Outcome: Not Progressing; in and out cath   Problem: RH PAIN MANAGEMENT Goal: RH STG PAIN MANAGED AT OR BELOW PT'S PAIN GOAL Description At or below level 6  Outcome: Not Progressing; c/o constant pain on arms/ hands   Problem: SCI BOWEL ELIMINATION Goal: RH STG MANAGE BOWEL WITH ASSISTANCE Description STG Manage Bowel with total Assistance.  Outcome: Not Progressing; bowel program

## 2017-12-18 ENCOUNTER — Inpatient Hospital Stay (HOSPITAL_COMMUNITY): Payer: Medicare HMO | Admitting: Physical Therapy

## 2017-12-18 DIAGNOSIS — E876 Hypokalemia: Secondary | ICD-10-CM

## 2017-12-18 LAB — URINALYSIS, ROUTINE W REFLEX MICROSCOPIC
BILIRUBIN URINE: NEGATIVE
Glucose, UA: NEGATIVE mg/dL
Hgb urine dipstick: NEGATIVE
KETONES UR: NEGATIVE mg/dL
NITRITE: POSITIVE — AB
PH: 6 (ref 5.0–8.0)
Protein, ur: 30 mg/dL — AB
Specific Gravity, Urine: 1.014 (ref 1.005–1.030)
WBC, UA: >50 WBC/hpf — ABNORMAL HIGH (ref 0–5)

## 2017-12-18 MED ORDER — NITROFURANTOIN MONOHYD MACRO 100 MG PO CAPS
100.0000 mg | ORAL_CAPSULE | Freq: Two times a day (BID) | ORAL | Status: DC
  Administered 2017-12-18 – 2017-12-20 (×4): 100 mg via ORAL
  Filled 2017-12-18 (×4): qty 1

## 2017-12-18 NOTE — Progress Notes (Signed)
MD notified of urinalysis results new order noted.

## 2017-12-18 NOTE — Progress Notes (Signed)
MD notified of cloudy urine with sediments. New orders noted.

## 2017-12-18 NOTE — Progress Notes (Signed)
Physical Therapy Session Note  Patient Details  Name: Sherry [name] MRN: [id number] Date of Birth: 16-Apr-1934  Today's Date: 12/18/2017 PT Individual Time:  0805-0910 PT individual Time Calculation (min): 44mn    Short Term Goals: Week 2:  PT Short Term Goal 1 (Week 2): Pt will utilize UE hooking to assist with bed mobility without cues in 50% of observations PT Short Term Goal 2 (Week 2): Pt will utilize BUE prop to maintain static sitting balance with mod assist 50% of observations PT Short Term Goal 3 (Week 2): Pt will initiate power w/c mobility training PT Short Term Goal 4 (Week 2): Pt will maintain forward weight shift during transfer in/out of w/c with min assist  Skilled Therapeutic Interventions/Progress Updates: Pt presented in bed with dgt present agreeable to therapy. Pt denies pain during session. Performed rollling L/R to don pants total A. Performed supine to sit with HOB elevated maxA x 1 with TLSO donned total A with pt's dgt present to guard pt.  Performed SB transfer maxA to power chair with pt requiring mod cues for hand placement as pt initially placing R arm around PTA's elbow. Verbal cues for increasing anterior wt shift with step placed under feet.  Session then focused on w/c mobility with power chair. Pt negotiated through several obstacle courses with PTA providing min/mod verbal cues for safely negotiating through obstacles. Pt drove chair back to room with PTA assisting in steering chair between sink and bed. Pt remained in chair at end of session with dgt present and current needs met.       Therapy Documentation Precautions:  Precautions Precautions: Cervical, Back, Fall Precaution Comments: back precautions due to thoracic fx s/p ACDF; needs TLSO for all OOB activity but per order set does not need cervical collar  Required Braces or Orthoses: Spinal Brace Spinal Brace: Thoracolumbosacral orthotic, Applied in sitting position Restrictions Weight Bearing  Restrictions: No General:   Vital Signs:   Pain: Pain Assessment Pain Scale: 0-10 Pain Score: 2  Pain Type: Acute pain Pain Location: Hand Pain Orientation: Left;Right Pain Descriptors / Indicators: Aching Pain Frequency: Constant Pain Onset: On-going Pain Intervention(s): Medication (See eMAR)   Therapy/Group: Individual Therapy  Briyan Kleven  Alanna Storti, PTA  12/18/2017, 12:29 PM

## 2017-12-18 NOTE — Plan of Care (Signed)
  Problem: SCI BLADDER ELIMINATION Goal: RH STG MANAGE BLADDER WITH MEDICATION WITH ASSISTANCE Description STG Manage Bladder With Medication With  Total Assistance.  Outcome: Not Progressing; ; in and out cath    Problem: RH PAIN MANAGEMENT Goal: RH STG PAIN MANAGED AT OR BELOW PT'S PAIN GOAL Description At or below level 6  Outcome: Not Progressing; c/o constant pain

## 2017-12-18 NOTE — Progress Notes (Signed)
[place] PHYSICAL MEDICINE & REHABILITATION PROGRESS NOTE   Subjective/Complaints: Patient seen laying in bed this morning.  She states she slept well overnight.  She denies complaints.  ROS: Denies CP, SOB, N/V/D  Objective:   No results found. No results for input(s): WBC, HGB, HCT, PLT in the last 72 hours. Recent Labs    12/16/17 0433  NA 136  K 3.8  CL 103  CO2 24  GLUCOSE 114*  BUN 12  CREATININE 0.77  CALCIUM 8.2*    Intake/Output Summary (Last 24 hours) at 12/18/2017 0738 Last data filed at 12/18/2017 0420 Gross per 24 hour  Intake 600 ml  Output 3280 ml  Net -2680 ml     Physical Exam: Vital Signs Blood pressure 112/72, pulse 76, temperature 98.4 F (36.9 C), resp. rate 18, SpO2 95 %. Constitutional: No distress . Vital signs reviewed. HENT: Normocephalic.  Atraumatic. Eyes: EOMI. No discharge. Cardiovascular: RRR.  No JVD. Respiratory: CTA bilaterally.  Normal effort. GI: BS +. Non-distended. Musc: No edema or tenderness in extremities. Musculoskeletal: Mild edema  Neurological: Alert and oriented. Sensation diminished to light touch below T4, stable Motor: RUE: Shoulder abduction, elbow flex 3+/5, elbow extension, wrist extension 2-/5, hand gip 1+/5, stable LUE: Shoulder abduction, elbow flexion 3-/5, wrist extension 2-/5, hand gip 1+/5, stable B/l LE: 1+/5 proximal to distal Skin: Incision c/d/i, warm and dry. Psychiatric: Normal mood.  Normal behavior.  Assessment/Plan: 1. 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  Physiatrist is providing close team supervision and 24 hour management of active medical problems listed below.  Physiatrist and rehab team continue to assess barriers to discharge/monitor patient progress toward functional and medical goals  Care Tool:  Bathing  Bathing activity did not occur:  Refused Body parts bathed by patient: Right arm, Left arm, Chest, Left upper leg, Right upper leg, Abdomen   Body parts bathed by helper: Front perineal area, Buttocks, Right lower leg, Left lower leg, Face Body parts n/a: Face, Front perineal area, Buttocks(previously washed )   Bathing assist Assist Level: 2 Helpers     Upper Body Dressing/Undressing Upper body dressing   What is the patient wearing?: Pull over shirt    Upper body assist Assist Level: 2 Helpers    Lower Body Dressing/Undressing Lower body dressing      What is the patient wearing?: Pants     Lower body assist Assist for lower body dressing: 2 Helpers     Toileting Toileting    Toileting assist Assist for toileting: 2 Helpers     Transfers Chair/bed transfer  Transfers assist     Chair/bed transfer assist level: 2 Helpers     Locomotion Ambulation   Ambulation assist   Ambulation activity did not occur: Safety/medical concerns          Walk 10 feet activity   Assist  Walk 10 feet activity did not occur: Safety/medical concerns        Walk 50 feet activity   Assist Walk 50 feet with 2 turns activity did not occur: Safety/medical concerns         Walk 150 feet activity   Assist Walk 150 feet activity did not occur: Safety/medical concerns         Walk 10 feet on uneven surface  activity   Assist Walk 10 feet on uneven surfaces activity did not occur: Safety/medical concerns  Wheelchair     Assist Will patient use wheelchair at discharge?: Yes Type of Wheelchair: Power Wheelchair activity did not occur: Safety/medical concerns  Wheelchair assist level: Supervision/Verbal cueing Max wheelchair distance: 150    Wheelchair 50 feet with 2 turns activity    Assist    Wheelchair 50 feet with 2 turns activity did not occur: Safety/medical concerns   Assist Level: Supervision/Verbal cueing   Wheelchair 150 feet activity     Assist  Wheelchair 150 feet activity did not occur: Safety/medical concerns   Assist Level: Supervision/Verbal cueing     Medical Problem List and Plan: 1.  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  Status post C5-6 and C6-7 anterior cervical discectomy and fusion 11/29/2017.  TLSO back brace when out of bed  --Continue CIR   2.  DVT Prophylaxis/Anticoagulation:  Acute mobile DVT Right common/proximal femoral vein  -IVCF placed by radiology  3. Pain Management:   Neurontin increased to 400mg  qid with improvement. Not oversedated    Valium 5 mg every 6 hours as needed muscle spasms  oxycodone as needed pain 4. Mood:   -ongoing ego support  -neuropsych   5. Neuropsych: This patient is capable of making decisions on her own behalf. 6. Skin/Wound Care: Routine skin checks 7. Fluids/Electrolytes/Nutrition: encourage PO  -replacing potassium  - protein for hypoalbuminemia  -trial of megace---PO improving, consider DC  -recheck labs tomorrow 8.  Neurogenic bowel and bladder.  Flomax 0.4 mg daily.    -I/O caths    -AM bowel program, senna-s + HS, AM suppository. Reduced senna-s to firm up stool  9.  Hypertension.  Norvasc 10 mg daily.   Vitals:   12/17/17 2228 12/18/17 0406  BP: 105/61 112/72  Pulse: 87 76  Resp: 20 18  Temp: 98 F (36.7 C) 98.4 F (36.9 C)  SpO2: 93% 95%   Controlled on 11/17 10. Acute blood loss anemia:     -hgb 10.9  11/11   -follow up tomorrow 11.  Leukocytosis  WBCs 11.6 on 11/11  Afebrile  Labs ordered for tomorrow  LOS: 11 days A FACE TO FACE EVALUATION WAS PERFORMED  Sierria Bruney Lorie Phenix 12/18/2017, 7:38 AM

## 2017-12-19 ENCOUNTER — Inpatient Hospital Stay (HOSPITAL_COMMUNITY): Payer: Medicare HMO | Admitting: Occupational Therapy

## 2017-12-19 ENCOUNTER — Inpatient Hospital Stay (HOSPITAL_COMMUNITY): Payer: Medicare HMO

## 2017-12-19 ENCOUNTER — Inpatient Hospital Stay (HOSPITAL_COMMUNITY): Payer: Medicare HMO | Admitting: Physical Therapy

## 2017-12-19 LAB — CBC
HCT: 32.2 % — ABNORMAL LOW (ref 36.0–46.0)
Hemoglobin: 10.2 g/dL — ABNORMAL LOW (ref 12.0–15.0)
MCH: 29.1 pg (ref 26.0–34.0)
MCHC: 31.7 g/dL (ref 30.0–36.0)
MCV: 91.7 fL (ref 80.0–100.0)
NRBC: 0 % (ref 0.0–0.2)
PLATELETS: 376 10*3/uL (ref 150–400)
RBC: 3.51 MIL/uL — AB (ref 3.87–5.11)
RDW: 14.9 % (ref 11.5–15.5)
WBC: 11.7 10*3/uL — AB (ref 4.0–10.5)

## 2017-12-19 LAB — BASIC METABOLIC PANEL
ANION GAP: 5 (ref 5–15)
BUN: 15 mg/dL (ref 8–23)
CALCIUM: 8.3 mg/dL — AB (ref 8.9–10.3)
CO2: 26 mmol/L (ref 22–32)
Chloride: 109 mmol/L (ref 98–111)
Creatinine, Ser: 0.59 mg/dL (ref 0.44–1.00)
GFR calc non Af Amer: >60 mL/min (ref >60)
Glucose, Bld: 103 mg/dL — ABNORMAL HIGH (ref 70–99)
Potassium: 3.8 mmol/L (ref 3.5–5.1)
Sodium: 140 mmol/L (ref 135–145)

## 2017-12-19 LAB — PREALBUMIN: PREALBUMIN: 11.3 mg/dL — AB (ref 18–38)

## 2017-12-19 MED ORDER — DM-GUAIFENESIN ER 30-600 MG PO TB12
1.0000 | ORAL_TABLET | Freq: Two times a day (BID) | ORAL | Status: DC
  Administered 2017-12-19 – 2018-01-19 (×62): 1 via ORAL
  Filled 2017-12-19 (×63): qty 1

## 2017-12-19 MED ORDER — MEGESTROL ACETATE 400 MG/10ML PO SUSP
400.0000 mg | Freq: Every day | ORAL | Status: DC
  Administered 2017-12-20 – 2017-12-27 (×8): 400 mg via ORAL
  Filled 2017-12-19 (×8): qty 10

## 2017-12-19 MED ORDER — GUAIFENESIN-DM 100-10 MG/5ML PO SYRP
15.0000 mL | ORAL_SOLUTION | Freq: Four times a day (QID) | ORAL | Status: DC | PRN
  Filled 2017-12-19: qty 15

## 2017-12-19 NOTE — Progress Notes (Signed)
[place] PHYSICAL MEDICINE & REHABILITATION PROGRESS NOTE   Subjective/Complaints: Slept well again. Pain comes and goes but under more control. Complains of congestion/cough/?dryness---had been on mucinex before which had helped  ROS: Patient denies fever, rash, sore throat, blurred vision, nausea, vomiting, diarrhea,  , shortness of breath or chest pain,  back pain, headache, or mood change.    Objective:   No results found. Recent Labs    12/19/17 0701  WBC 11.7*  HGB 10.2*  HCT 32.2*  PLT 376   No results for input(s): NA, K, CL, CO2, GLUCOSE, BUN, CREATININE, CALCIUM in the last 72 hours.  Intake/Output Summary (Last 24 hours) at 12/19/2017 0846 Last data filed at 12/19/2017 0600 Gross per 24 hour  Intake 540 ml  Output 2875 ml  Net -2335 ml     Physical Exam: Vital Signs Blood pressure 127/69, pulse 80, temperature 98.4 F (36.9 C), resp. rate 16, SpO2 94 %. Constitutional: No distress . Vital signs reviewed. HEENT: EOMI, oral membranes moist Neck: supple Cardiovascular: RRR without murmur. No JVD    Respiratory: CTA Bilaterally without wheezes or rales. Normal effort    GI: BS +, non-tender, non-distended  Musc: No edema or tenderness in extremities. Musculoskeletal: Mild edema distally in LE's Neurological: Alert and oriented. Sensation diminished to light touch below T4, no changes Motor: RUE: Shoulder abduction, elbow flex 3+/5, elbow extension, wrist extension 2-/5, hand gip 1+/5, stable LUE: Shoulder abduction, elbow flexion 3-/5, wrist extension 2-/5, hand gip 1+/5, stable B/l LE: 1+/5 proximal to distal -no resting tone Skin: Incision c/d/i, warm and dry. Psychiatric: pleasant.  Assessment/Plan: 1. 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  Physiatrist is providing close team supervision and 24 hour management of active  medical problems listed below.  Physiatrist and rehab team continue to assess barriers to discharge/monitor patient progress toward functional and medical goals  Care Tool:  Bathing  Bathing activity did not occur: Refused Body parts bathed by patient: Right arm, Left arm, Chest, Left upper leg, Right upper leg, Abdomen   Body parts bathed by helper: Front perineal area, Buttocks, Right lower leg, Left lower leg, Face Body parts n/a: Face, Front perineal area, Buttocks(previously washed )   Bathing assist Assist Level: 2 Helpers     Upper Body Dressing/Undressing Upper body dressing   What is the patient wearing?: Pull over shirt    Upper body assist Assist Level: 2 Helpers    Lower Body Dressing/Undressing Lower body dressing      What is the patient wearing?: Pants     Lower body assist Assist for lower body dressing: 2 Helpers     Toileting Toileting    Toileting assist Assist for toileting: 2 Helpers     Transfers Chair/bed transfer  Transfers assist     Chair/bed transfer assist level: 2 Helpers(slide board)     Locomotion Ambulation   Ambulation assist   Ambulation activity did not occur: Safety/medical concerns          Walk 10 feet activity   Assist  Walk 10 feet activity did not occur: Safety/medical concerns        Walk 50 feet activity   Assist Walk 50 feet with 2 turns activity did not occur: Safety/medical concerns         Walk 150 feet activity   Assist Walk 150 feet activity did not occur: Safety/medical concerns  Walk 10 feet on uneven surface  activity   Assist Walk 10 feet on uneven surfaces activity did not occur: Safety/medical concerns         Wheelchair     Assist Will patient use wheelchair at discharge?: Yes Type of Wheelchair: Power Wheelchair activity did not occur: Safety/medical concerns  Wheelchair assist level: Supervision/Verbal cueing Max wheelchair distance: 150     Wheelchair 50 feet with 2 turns activity    Assist    Wheelchair 50 feet with 2 turns activity did not occur: Safety/medical concerns   Assist Level: Supervision/Verbal cueing   Wheelchair 150 feet activity     Assist Wheelchair 150 feet activity did not occur: Safety/medical concerns   Assist Level: Supervision/Verbal cueing     Medical Problem List and Plan: 1.  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  Status post C5-6 and C6-7 anterior cervical discectomy and fusion 11/29/2017.  TLSO back brace when out of bed  -Continue CIR therapies including PT, OT    2.  DVT Prophylaxis/Anticoagulation:  Acute mobile DVT Right common/proximal femoral vein  -IVCF placed by radiology  3. Pain Management:   Neurontin increased to 400mg  qid with improvement. Not oversedated    Valium 5 mg every 6 hours as needed muscle spasms  oxycodone as needed pain 4. Mood:   -ongoing ego support  -neuropsych   5. Neuropsych: This patient is capable of making decisions on her own behalf. 6. Skin/Wound Care: Routine skin checks 7. Fluids/Electrolytes/Nutrition: encourage PO  -replacing potassium  - protein for hypoalbuminemia  -trial of megace---PO improving, wean to once daily  BMET pending 8.  Neurogenic bowel and bladder.  Flomax 0.4 mg daily.    -I/O caths    -AM bowel program, senna-s + HS, AM suppository. Reduced senna-s to firm up stool  9.  Hypertension.  Norvasc 10 mg daily.   Vitals:   12/18/17 0406 12/18/17 2140  BP: 112/72 127/69  Pulse: 76 80  Resp: 18 16  Temp: 98.4 F (36.9 C) 98.4 F (36.9 C)  SpO2: 95% 94%   Controlled on 11/18 10. Acute blood loss anemia:     -hgb 10.9  11/11   -follow up tomorrow 11.  Leukocytosis  WBCs 11.7 11/18  Afebrile  -UA+, empiric macrobid started, UCX pending  LOS: 12 days A FACE TO [place] 12/19/2017, 8:46 AM

## 2017-12-19 NOTE — Progress Notes (Signed)
Occupational Therapy Session Note  Patient Details  Name: Sherry [name] MRN: [id number] Date of Birth: [birth year]  Today's Date: 12/19/2017 OT Individual Time: 1300-1358 OT Individual Time Calculation (min): 58 min   Skilled Therapeutic Interventions/Progress Updates:    Pt greeted in recliner, eating lunch with friends. Started tx with pt negotiating PWC in front of sink with min-mod instructional cues. She completed oral care and hair combing using dorsal wrist cuff for both tasks, requiring Min A for oral care and Mod A while using comb. Mod A for anterior weight shifts to reach needed items or faucet levers. After she completed slideboard<bed with 2 helpers with cues for forward weight shift. Once EOB, readjusted TLSO that was riding up. For remainder of session, worked on static sitting balance EOB. Pt able to recognize Rt or posterior LOBs, however Max A needed to correct. She participated in anterior and lateral reaches to focus on small weight shifts and initiating balance corrections. Near end of tx, pt able to maintain unsupported balance for 2-3 seconds with close supervision! Pt was then returned to bed and repositioned for comfort with 2 helpers. Pt left with RN staff for scheduled cath.    Therapy Documentation Precautions:  Precautions Precautions: Cervical, Back, Fall Precaution Comments: back precautions due to thoracic fx s/p ACDF; needs TLSO for all OOB activity but per order set does not need cervical collar  Required Braces or Orthoses: Spinal Brace Spinal Brace: Thoracolumbosacral orthotic, Applied in sitting position Restrictions Weight Bearing Restrictions: No Pain: Pt left with RN to receive pain medicine at end of tx  Pain Assessment Pain Scale: 0-10 Pain Score: 0-No pain ADL: ADL Equipment Provided: Other (comment)(dorsal wrist cuff) Eating: Supervision/safety Where Assessed-Eating: Wheelchair Grooming: Not assessed Where Assessed-Grooming: Bed  level Upper Body Bathing: Moderate assistance Where Assessed-Upper Body Bathing: Bed level Lower Body Bathing: Other (comment), Maximal assistance(2 helpers) Where Assessed-Lower Body Bathing: Bed level Upper Body Dressing: Maximal assistance, Other (Comment)(2 helpers) Where Assessed-Upper Body Dressing: Bed level Lower Body Dressing: Maximal assistance, Other (Comment)(2 helpers) Where Assessed-Lower Body Dressing: Bed level Toileting: Not assessed Toilet Transfer: Not assessed Walk-In Shower Transfer: Not assessed     Therapy/Group: Individual Therapy  Tomy Khim A Macoy Rodwell 12/19/2017, 4:07 PM

## 2017-12-19 NOTE — Progress Notes (Signed)
Physical Therapy Session Note  Patient Details  Name: Sherry [name] MRN: [id number] Date of Birth: [birth year]  Today's Date: 12/19/2017 PT Individual Time: 0830-1000 PT Individual Time Calculation (min): 90 min   Short Term Goals: Week 2:  PT Short Term Goal 1 (Week 2): Pt will utilize UE hooking to assist with bed mobility without cues in 50% of observations PT Short Term Goal 2 (Week 2): Pt will utilize BUE prop to maintain static sitting balance with mod assist 50% of observations PT Short Term Goal 3 (Week 2): Pt will initiate power w/c mobility training PT Short Term Goal 4 (Week 2): Pt will maintain forward weight shift during transfer in/out of w/c with min assist  Skilled Therapeutic Interventions/Progress Updates:    c/o pain as below.  Session focus on increase independence with self care, functional mobility, and w/c mobility.    Using u-cuff pt able to self feed pre-cut food with supervision, assist still needed for managing liquids.  Rolling L/R with max assist +1 to pull pants over hips (total assist for LB dressing).  Supine>sit with +2 assist and HOB elevated.  UB dressing from EOB with min/mod assist for static balance and total assist for dressing.  +2 for slide board to w/c on L.  W/C propulsion throughout unit, focus on navigating obstacles at increasing speeds and backing up.  UE NMR/strengthening and core activation with ball taps from upright w/c and table top task with super soft theraputty focus on rolling into ball.  Pt returned to room at end of session and positioned in w/c with call bell in reach and needs met.   Therapy Documentation Precautions:  Precautions Precautions: Cervical, Back, Fall Precaution Comments: back precautions due to thoracic fx s/p ACDF; needs TLSO for all OOB activity but per order set does not need cervical collar  Required Braces or Orthoses: Spinal Brace Spinal Brace: Thoracolumbosacral orthotic, Applied in sitting  position Restrictions Weight Bearing Restrictions: No  Pain: Pain Assessment Pain Scale: 0-10 Pain Score: 6     Therapy/Group: Individual Therapy  Michel Santee 12/19/2017, 10:32 AM

## 2017-12-19 NOTE — Progress Notes (Addendum)
Physical Therapy Session Note  Patient Details  Name: Sherry [name] MRN: [id number] Date of Birth: 06-20-1934  Today's Date: 12/19/2017 PT Individual Time: 4098-1191 and 1510-1535 PT Individual Time Calculation (min): 75 min , 25 min  Short Term Goals:  Week 2:  PT Short Term Goal 1 (Week 2): Pt will utilize UE hooking to assist with bed mobility without cues in 50% of observations PT Short Term Goal 2 (Week 2): Pt will utilize BUE prop to maintain static sitting balance with mod assist 50% of observations PT Short Term Goal 3 (Week 2): Pt will initiate power w/c mobility training PT Short Term Goal 4 (Week 2): Pt will maintain forward weight shift during transfer in/out of w/c with min assist  Skilled Therapeutic Interventions/Progress Updates:   tx 1:  Pt sitting up in w/c, tilted back.  W/c propulsion using power w/c with supervision, focusing on straight path, turns, lifting hand off of goal -post joy stick when stopped to prevent drifting backwards as her RUE relaxed, visualizing pathway instead of tracking module screen.  Mod cues.  Pt became tearful when attempting a cone obstacle course, due to over- correcting.  She stated she felt groggy from previous rest break between therapies.  PT offered emotional support.  Beezy board transfer w/c to L +2 with feet supported on foot stool to/from mat. Pt maintained forward weight shift with in cues during transfer.  In supine, neuromuscular re-education via multimodal cues for bil gluteal sets, bil hip adductor squeezes, R ankle pumps, L ankle pumps.  Rolling midline > L and R with set up into hook lying, min cues for UE positions, max assist.  Sit>< supine +2.   Pt left resting in power w/c in slightly tilted position, with needs at hand.   tx 2:  Pt resting in bed.  Pt exhausted, but willing to pariticpate in bedside tx. She stated that her UEs were really starting to hurt.  PT prompted her to use call bell.  Pt used soft touch  call bell successfully, but was unable to be heard by secretary when she talked into regular call bell.  Pt was not concerned about this, stating that she always has family with her.  PT educated pt on assuming responsibility for her safety by practicing talking into call bell.    Neuromuscular re-education via multimodal cues including visual feedback, in supine,  for active assistive R/L hip abduction/adduction, straight leg raises, R resisted ankle PF,  L ankle pumps with resistance provided for PF.  Pt left resting in supine with K pad over bil hands, soft call bell secured to R railing, and regular call bell on her chest.  Bed alarm set.  She may benefit from a method of securing regular call bell on her chest so that it does not slide off after she hits soft call bell with her R hand.     Therapy Documentation Precautions:  Precautions Precautions: Cervical, Back, Fall Precaution Comments: back precautions due to thoracic fx s/p ACDF; needs TLSO for all OOB activity but per order set does not need cervical collar  Required Braces or Orthoses: Spinal Brace Spinal Brace: Thoracolumbosacral orthotic, Applied in sitting position Restrictions Weight Bearing Restrictions: No   Pain: Pain Assessment AM tx-bil hands; "really cold, not exactly painful".  Neuropathic pain.  Pt used socks on bil hands during session, and PT applied K pad to bil hands at end of session.   PM tx- bil hands and forearms 6/10, medicated during  session. Therapy/Group: Individual Therapy  Sherry [name] 12/19/2017, 12:20 PM

## 2017-12-20 ENCOUNTER — Inpatient Hospital Stay (HOSPITAL_COMMUNITY): Payer: Medicare HMO

## 2017-12-20 ENCOUNTER — Inpatient Hospital Stay (HOSPITAL_COMMUNITY): Payer: Medicare HMO | Admitting: Physical Therapy

## 2017-12-20 ENCOUNTER — Inpatient Hospital Stay (HOSPITAL_COMMUNITY): Payer: Medicare HMO | Admitting: Occupational Therapy

## 2017-12-20 LAB — URINE CULTURE: Culture: >=100000 — AB

## 2017-12-20 MED ORDER — AMOXICILLIN 250 MG PO CAPS
250.0000 mg | ORAL_CAPSULE | Freq: Three times a day (TID) | ORAL | Status: DC
  Administered 2017-12-20 – 2017-12-25 (×17): 250 mg via ORAL
  Filled 2017-12-20 (×17): qty 1

## 2017-12-20 NOTE — Progress Notes (Signed)
All sutures removed from patient's left elbow.  Transparent dressing applied to protect area.

## 2017-12-20 NOTE — Progress Notes (Signed)
[place] PHYSICAL MEDICINE & REHABILITATION PROGRESS NOTE   Subjective/Complaints: Concerned about swelling in feet and hands last night. Some associated pain. "having a pity party" this morning. Swelling better this am  ROS: Patient denies fever, rash, sore throat, blurred vision, nausea, vomiting, diarrhea, cough, shortness of breath or chest pain,   headache.   Objective:   No results found. Recent Labs    12/19/17 0701  WBC 11.7*  HGB 10.2*  HCT 32.2*  PLT 376   Recent Labs    12/19/17 0701  NA 140  K 3.8  CL 109  CO2 26  GLUCOSE 103*  BUN 15  CREATININE 0.59  CALCIUM 8.3*    Intake/Output Summary (Last 24 hours) at 12/20/2017 0847 Last data filed at 12/20/2017 0630 Gross per 24 hour  Intake 120 ml  Output 1950 ml  Net -1830 ml     Physical Exam: Vital Signs Blood pressure 112/62, pulse 75, temperature 98.4 F (36.9 C), resp. rate 18, SpO2 94 %. Constitutional: No distress . Vital signs reviewed. HEENT: EOMI, oral membranes moist Neck: supple Cardiovascular: RRR without murmur. No JVD    Respiratory: CTA Bilaterally without wheezes or rales. Normal effort    GI: BS +, non-tender, non-distended  Musc: No edema or tenderness in extremities. Musculoskeletal: Mild edema distally in LE's, none in UE.  Neurological: Alert and oriented. Sensation diminished to light touch below T4, no changes Motor: RUE: Shoulder abduction, elbow flex 3+/5, elbow extension, wrist extension 1+ to 2-/5, hand gip 0/5  LUE: Shoulder abduction, elbow flexion 3-/5, wrist extension 2/5, hand gip 0/5,   B/l LE: 1+/5 proximal to distal -no resting tone Skin: Incision c/d/i, warm and dry. Psychiatric: anxious initially but settled down.  Assessment/Plan: 1. 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  Physiatrist is providing close team supervision and 24  hour management of active medical problems listed below.  Physiatrist and rehab team continue to assess barriers to discharge/monitor patient progress toward functional and medical goals  Care Tool:  Bathing  Bathing activity did not occur: Refused Body parts bathed by patient: Right arm, Left arm, Chest, Left upper leg, Right upper leg, Abdomen   Body parts bathed by helper: Front perineal area, Buttocks, Right lower leg, Left lower leg, Face Body parts n/a: Face, Front perineal area, Buttocks(previously washed )   Bathing assist Assist Level: 2 Helpers     Upper Body Dressing/Undressing Upper body dressing   What is the patient wearing?: Pull over shirt    Upper body assist Assist Level: 2 Helpers    Lower Body Dressing/Undressing Lower body dressing      What is the patient wearing?: Pants     Lower body assist Assist for lower body dressing: 2 Helpers     Toileting Toileting    Toileting assist Assist for toileting: 2 Helpers     Transfers Chair/bed transfer  Transfers assist     Chair/bed transfer assist level: 2 Helpers     Locomotion Ambulation   Ambulation assist   Ambulation activity did not occur: Safety/medical concerns          Walk 10 feet activity   Assist  Walk 10 feet activity did not occur: Safety/medical concerns        Walk 50 feet activity   Assist Walk 50 feet with 2 turns activity did not occur: Safety/medical concerns  Walk 150 feet activity   Assist Walk 150 feet activity did not occur: Safety/medical concerns         Walk 10 feet on uneven surface  activity   Assist Walk 10 feet on uneven surfaces activity did not occur: Safety/medical concerns         Wheelchair     Assist Will patient use wheelchair at discharge?: Yes Type of Wheelchair: Power Wheelchair activity did not occur: Safety/medical concerns  Wheelchair assist level: Supervision/Verbal cueing Max wheelchair distance:  120    Wheelchair 50 feet with 2 turns activity    Assist    Wheelchair 50 feet with 2 turns activity did not occur: Safety/medical concerns   Assist Level: Supervision/Verbal cueing   Wheelchair 150 feet activity     Assist Wheelchair 150 feet activity did not occur: Safety/medical concerns   Assist Level: Supervision/Verbal cueing     Medical Problem List and Plan: 1.  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  Status post C5-6 and C6-7 anterior cervical discectomy and fusion 11/29/2017.  TLSO back brace when out of bed  -Interdisciplinary Team Conference today      2.  DVT Prophylaxis/Anticoagulation:  Acute mobile DVT Right common/proximal femoral vein  -IVCF placed by radiology  3. Pain Management:   Neurontin increased to 400mg  qid with improvement. Not oversedated    Valium 5 mg every 6 hours as needed muscle spasms  oxycodone as needed pain 4. Mood:   -ongoing ego support  -neuropsych   5. Neuropsych: This patient is capable of making decisions on her own behalf. 6. Skin/Wound Care: Routine skin checks  -discussed with patient that she might experience dependent edema in distal limbs. ---elevate, compress as needed, gabapentin also might contirbute  -better nutrition will also help 7. Fluids/Electrolytes/Nutrition: encourage PO  -replacing potassium  - protein for hypoalbuminemia  -trial of megace---po better but prealbumin still low (11)  -I personally reviewed the patient's labs today.   8.  Neurogenic bowel and bladder.  Flomax 0.4 mg daily.    -I/O caths    -AM bowel program, senna-s + HS, AM suppository.   senna-s 1 tab qhs 9.  Hypertension.  Norvasc 10 mg daily.   Vitals:   12/19/17 2030 12/20/17 0523  BP: 132/69 112/62  Pulse: 91 75  Resp: 18 18  Temp:  98.4 F (36.9 C)  SpO2: 95% 94%   Controlled on 11/19 10. Acute blood loss anemia:     -hgb 10.9  11/11-->10.2 11/18----recheck again  Thursday  -no signs of blood loss 11.  Leukocytosis  WBCs 11.7 11/18  Urine with 100k E coli---pan-sensitive but change macrobid to amoxil 500mg  tid   LOS: 13 days A FACE TO FACE EVALUATION WAS PERFORMED  Meredith Staggers 12/20/2017, 8:47 AM

## 2017-12-20 NOTE — Progress Notes (Signed)
Physical Therapy Session Note  Patient Details  Name: Sherry [name] MRN: [id number] Date of Birth: 1934-03-07  Today's Date: 12/20/2017 PT Individual Time: 2297-9892 PT Individual Time Calculation (min): 38 min   Short Term Goals: Week 2:  PT Short Term Goal 1 (Week 2): Pt will utilize UE hooking to assist with bed mobility without cues in 50% of observations PT Short Term Goal 2 (Week 2): Pt will utilize BUE prop to maintain static sitting balance with mod assist 50% of observations PT Short Term Goal 3 (Week 2): Pt will initiate power w/c mobility training PT Short Term Goal 4 (Week 2): Pt will maintain forward weight shift during transfer in/out of w/c with min assist  Skilled Therapeutic Interventions/Progress Updates:    c/o pain as below.  Pt requesting to stay in bed for session having just transferred back.  PT provided PROM to BLEs 3x30-45 seconds each for hip IR/ER, glutes, hip flexors, hamstrings (distal and proximal), and heel cords.  Pt educated about purpose of daily stretching and reducing risk of contractures.  Positioned to comfort with TLSO removed at end of session, call bell in reach and needs met.   Therapy Documentation Precautions:  Precautions Precautions: Cervical, Back, Fall Precaution Comments: back precautions due to thoracic fx s/p ACDF; needs TLSO for all OOB activity but per order set does not need cervical collar  Required Braces or Orthoses: Spinal Brace Spinal Brace: Thoracolumbosacral orthotic, Applied in sitting position Restrictions Weight Bearing Restrictions: No Pain: Pain Assessment Pain Scale: 0-10 Pain Score: 8  Pain Type: Acute pain Pain Location: Hand Pain Orientation: Right;Left Pain Descriptors / Indicators: Aching;Burning Pain Frequency: Constant Pain Onset: On-going Patients Stated Pain Goal: 4 Pain Intervention(s): Medication (See eMAR)    Therapy/Group: Individual Therapy  Michel Santee 12/20/2017, 3:39 PM

## 2017-12-20 NOTE — Plan of Care (Signed)
  Problem: RH SKIN INTEGRITY Goal: RH STG SKIN FREE OF INFECTION/BREAKDOWN Description With total assist  Outcome: Progressing  Sutures LUE, need removing

## 2017-12-20 NOTE — Progress Notes (Signed)
Physical Therapy Session Note  Patient Details  Name: Sherry [name] MRN: [id number] Date of Birth: [birth year]  Today's Date: 12/20/2017 PT Individual Time: 1100-1200 PT Individual Time Calculation (min): 60 min   Short Term Goals: Week 2:  PT Short Term Goal 1 (Week 2): Pt will utilize UE hooking to assist with bed mobility without cues in 50% of observations PT Short Term Goal 2 (Week 2): Pt will utilize BUE prop to maintain static sitting balance with mod assist 50% of observations PT Short Term Goal 3 (Week 2): Pt will initiate power w/c mobility training PT Short Term Goal 4 (Week 2): Pt will maintain forward weight shift during transfer in/out of w/c with min assist  Skilled Therapeutic Interventions/Progress Updates:    Pt received supine in bed, agreeable to PT. No complaints of pain but does report B hands feeling cold this AM. Supine to R sidelying with max A and multimodal cueing for placement. Sidelying to sitting EOB with assist x 2 for BLE management and trunk control. Dependent to don TLSO while seated EOB. Max A for sitting balance EOB due to posterior lean. Sliding board transfer bed to w/c with assist x 2 with step under BLE. Power w/c mobility 2 x 150 ft with v/c for obstacle avoidance and safety environment navigation. Beezy board transfer w/c to/from mat table with assist x 2 with step under BLE. Sitting balance EOM with BLE supported with max A progressing to CGA to maintain sitting balance. Pt exhibits posterior lean in sitting with multimodal cueing to correct and leaning forwards towards targets. Pt also exhibits lean to the L in sitting with onset of fatigue, no awareness of lean. Pt fatigues quickly in sitting position. Power w/c navigation x 200 ft with Supervision navigating busy environment and obstacles from therapy gym to patient's room with focus on staying to the R of hallways. Pt left reclined in power w/c with needs in reach and NT present to assist pt with  lunch.   Therapy Documentation Precautions:  Precautions Precautions: Cervical, Back, Fall Precaution Comments: back precautions due to thoracic fx s/p ACDF; needs TLSO for all OOB activity but per order set does not need cervical collar  Required Braces or Orthoses: Spinal Brace Spinal Brace: Thoracolumbosacral orthotic, Applied in sitting position Restrictions Weight Bearing Restrictions: No    Therapy/Group: Individual Therapy  Excell Seltzer, PT, DPT  12/20/2017, 12:08 PM

## 2017-12-20 NOTE — Progress Notes (Signed)
Occupational Therapy Session Note  Patient Details  Name: Sherry [name] MRN: [id number] Date of Birth: 04-03-1934  Today's Date: 12/20/2017 OT Individual Time: 0932-1100 OT Individual Time Calculation (min): 88 min    Short Term Goals: Week 2:  OT Short Term Goal 1 (Week 2): Pt will complete 1 grooming task with AE and Min A OT Short Term Goal 2 (Week 2): Pt will complete UB dressing with Max A  OT Short Term Goal 3 (Week 2): Pts dtr will demonstrate carryover of pressure relief education by assisting pt with position changes during daily routine   Skilled Therapeutic Interventions/Progress Updates:    Upon entering the room, pt supine in bed and very tearful. Pt reporting concerns over "what life will really be like" and OT providing therapeutic use of self. Pt more calm and verbalizing tightness on stomach and need for "BM".  Pt rolling L <> R with max A for maxi move sling and transferred onto padded commode chair. Pt having successful BM and returned to bed for hygiene with total A. B TEDs and pants donned with total A as well. Pt rolling x 6 L <> R for hygiene and clothing management. Supine >sit from elevated HOB with max A and pt pulling shirt over L UE, partially up R UE, and using thumbs to hook and try to pull over neck this session. Pt able to interlace hands and hold cup to bring to mouth with assist to place cup into hands. Pt remained in bed with soft call bell within reach. Bed alarm activated.  Therapy Documentation Precautions:  Precautions Precautions: Cervical, Back, Fall Precaution Comments: back precautions due to thoracic fx s/p ACDF; needs TLSO for all OOB activity but per order set does not need cervical collar  Required Braces or Orthoses: Spinal Brace Spinal Brace: Thoracolumbosacral orthotic, Applied in sitting position Restrictions Weight Bearing Restrictions: No Pain: Pain Assessment Pain Scale: 0-10 Pain Score: 8  Pain Type: Acute pain Pain Location:  Hand Pain Orientation: Right;Left Pain Descriptors / Indicators: Aching;Burning Pain Frequency: Constant Pain Onset: On-going Patients Stated Pain Goal: 4 Pain Intervention(s): Medication (See eMAR) ADL: ADL Equipment Provided: Other (comment)(dorsal wrist cuff) Eating: Supervision/safety Where Assessed-Eating: Wheelchair Grooming: Not assessed Where Assessed-Grooming: Bed level Upper Body Bathing: Moderate assistance Where Assessed-Upper Body Bathing: Bed level Lower Body Bathing: Other (comment), Maximal assistance(2 helpers) Where Assessed-Lower Body Bathing: Bed level Upper Body Dressing: Maximal assistance, Other (Comment)(2 helpers) Where Assessed-Upper Body Dressing: Bed level Lower Body Dressing: Maximal assistance, Other (Comment)(2 helpers) Where Assessed-Lower Body Dressing: Bed level Toileting: Not assessed Toilet Transfer: Not assessed Walk-In Shower Transfer: Not assessed   Therapy/Group: Individual Therapy  [name] 12/20/2017, 11:46 AM

## 2017-12-20 NOTE — Progress Notes (Signed)
Occupational Therapy Session Note  Patient Details  Name: Sherry [name] MRN: [id number] Date of Birth: 04-27-1934  Today's Date: 12/20/2017 OT Individual Time: 1345-1425 OT Individual Time Calculation (min): 40 min    Short Term Goals: Week 2:  OT Short Term Goal 1 (Week 2): Pt will complete 1 grooming task with AE and Min A OT Short Term Goal 2 (Week 2): Pt will complete UB dressing with Max A  OT Short Term Goal 3 (Week 2): Pts dtr will demonstrate carryover of pressure relief education by assisting pt with position changes during daily routine   Skilled Therapeutic Interventions/Progress Updates:    Session focused on bed level [facility] and hand strengthening. Emotional support and therapeutic use of self while pt spoke about condition and CLOF. Pt required min HOH assistance to bring cup to mouth. Pt was guided through theraputty exercises uni/bimanually, with a focus on isolating finger flexion, full hand flexion/extension, and wrist stability. Pt active throughout session. Pt was left with soft call bell within reach and all needs met.   Therapy Documentation Precautions:  Precautions Precautions: Cervical, Back, Fall Precaution Comments: back precautions due to thoracic fx s/p ACDF; needs TLSO for all OOB activity but per order set does not need cervical collar  Required Braces or Orthoses: Spinal Brace Spinal Brace: Thoracolumbosacral orthotic, Applied in sitting position Restrictions Weight Bearing Restrictions: No   Vital Signs: Therapy Vitals Temp: 98.2 F (36.8 C) Pulse Rate: 89 Resp: 19 BP: 121/66 Patient Position (if appropriate): Lying Oxygen Therapy SpO2: 97 % O2 Device: Room Air Pain: Pain Assessment Pain Scale: 0-10 Pain Score: 0-No pain Pain Type: Acute pain Pain Location: Hand Pain Orientation: Right;Left Pain Descriptors / Indicators: Aching;Burning Pain Frequency: Constant Pain Onset: On-going Patients Stated Pain Goal: 4 Pain Intervention(s):  Medication (See eMAR)  Therapy/Group: Individual Therapy  Curtis Sites 12/20/2017, 3:47 PM

## 2017-12-21 ENCOUNTER — Inpatient Hospital Stay (HOSPITAL_COMMUNITY): Payer: Medicare HMO | Admitting: Occupational Therapy

## 2017-12-21 ENCOUNTER — Inpatient Hospital Stay (HOSPITAL_COMMUNITY): Payer: Medicare HMO | Admitting: Physical Therapy

## 2017-12-21 ENCOUNTER — Encounter (HOSPITAL_COMMUNITY): Payer: Self-pay | Admitting: *Deleted

## 2017-12-21 MED ORDER — SENNA 8.6 MG PO TABS
1.0000 | ORAL_TABLET | Freq: Every day | ORAL | Status: DC
  Administered 2017-12-21 – 2017-12-26 (×6): 8.6 mg via ORAL
  Filled 2017-12-21 (×6): qty 1

## 2017-12-21 NOTE — Progress Notes (Signed)
Physical Therapy Session Note  Patient Details  Name: Sherry [name] MRN: [id number] Date of Birth: 03/09/1934  Today's Date: 12/21/2017 PT Individual Time: 1100-1202 PT Individual Time Calculation (min): 62 min   Short Term Goals: Week 2:  PT Short Term Goal 1 (Week 2): Pt will utilize UE hooking to assist with bed mobility without cues in 50% of observations PT Short Term Goal 2 (Week 2): Pt will utilize BUE prop to maintain static sitting balance with mod assist 50% of observations PT Short Term Goal 3 (Week 2): Pt will initiate power w/c mobility training PT Short Term Goal 4 (Week 2): Pt will maintain forward weight shift during transfer in/out of w/c with min assist  Skilled Therapeutic Interventions/Progress Updates:    Patient received up in power chair with OT finishing session, very pleasant and willing to participate in PT. She requires S and MinA to drive power chair in hallway with cues to look up where she is driving due to tendency to look down at controls instead of up where she's driving, able to drive much more smoothly and safely when cued to look up/given visual cues to look up ahead. Practiced transfers with Mod cues for patient to be able to tell caregivers sequence of events for transfers to and from [facility] but improving during session. She continues to require +2 MaxA for transfers to and from chair on beesy board as well as for repositioning in chair. Focused most of session on maintaining sitting balance on mat table initially with ModA to maintain balance fading to min guard with Min-Mod cues for upright and forward lean. Worked on reaching forwards to promote upright posture and forward weight shift in sitting. She required one rest break during this activity. She was able to drive power chair back to room with Min cues and much better technique/safety and was transferred back to bed MaxAx2 with beesy board. She was left in bed with RN present and attending, all needs  otherwise met.   Therapy Documentation Precautions:  Precautions Precautions: Cervical, Back, Fall Precaution Comments: back precautions due to thoracic fx s/p ACDF; needs TLSO for all OOB activity but per order set does not need cervical collar  Required Braces or Orthoses: Spinal Brace Spinal Brace: Thoracolumbosacral orthotic, Applied in sitting position Restrictions Weight Bearing Restrictions: No General:   Vital Signs:   Pain: Pain Assessment Pain Scale: 0-10 Pain Score: 0-No pain Pain Type: Acute pain Pain Location: Hand Pain Orientation: Right;Left Pain Descriptors / Indicators: Aching;Burning Pain Frequency: Constant Pain Onset: On-going Patients Stated Pain Goal: 4 Pain Intervention(s): Medication (See eMAR)    Therapy/Group: Individual Therapy  Deniece [name] PT, DPT, CBIS  Supplemental Physical Therapist [facility]    Pager [phone] Acute Rehab Office [phone]   12/21/2017, 12:16 PM

## 2017-12-21 NOTE — Progress Notes (Signed)
Physical Therapy Session Note  Patient Details  Name: Sherry [name] MRN: [id number] Date of Birth: 11-21-1934  Today's Date: 12/21/2017 PT Individual Time: 8889-1694 PT Individual Time Calculation (min): 63 min   Short Term Goals: Week 2:  PT Short Term Goal 1 (Week 2): Pt will utilize UE hooking to assist with bed mobility without cues in 50% of observations PT Short Term Goal 2 (Week 2): Pt will utilize BUE prop to maintain static sitting balance with mod assist 50% of observations PT Short Term Goal 3 (Week 2): Pt will initiate power w/c mobility training PT Short Term Goal 4 (Week 2): Pt will maintain forward weight shift during transfer in/out of w/c with min assist  Skilled Therapeutic Interventions/Progress Updates:    Patient received in bed, pleasant but fatigued this afternoon. Performed rolling with MaxAx1, sidelying to sit with MaxAx2 to maintain precautions, and TLSO brace adjusted/tightened while sitting at EOB. Due to fatigue she required MaxAx2 for use of beesy board to [facility] and MaxAx2 to readjust position in WC. Able to drive WC to PT gym with S and intermittent VC to look where she is driving, WC driving improved since morning session however. MaxAx2 for beesy board transfer to table and practiced lateral trunk with use of core muscles and triceps musculature with Mod cues for technique. Very fatigued this afternoon and required rest break during reps of elbow to upright today. MaxAx2 for beesy board transfer back to mat table, continued to practice driving power chair again with much improved control of chair and only intermittent cues for driving in hallway. Finished session with gentle STM to cervical extensors, suboccipitals, and upper traps due to patient report of pain and spasm in her neck. She was left up in power chair with OT attending, all needs otherwise met this afternoon.   Therapy Documentation Precautions:  Precautions Precautions: Cervical, Back,  Fall Precaution Comments: back precautions due to thoracic fx s/p ACDF; needs TLSO for all OOB activity but per order set does not need cervical collar  Required Braces or Orthoses: Spinal Brace Spinal Brace: Thoracolumbosacral orthotic, Applied in sitting position Restrictions Weight Bearing Restrictions: No General:   Vital Signs:   Pain: Pain Assessment Pain Scale: 0-10 Pain Score: 0-No pain Pain Type: Acute pain Pain Location: Hand Pain Orientation: Right;Left Pain Descriptors / Indicators: Aching;Burning Pain Frequency: Constant Pain Onset: On-going Patients Stated Pain Goal: 4 Pain Intervention(s): Medication (See eMAR)    Therapy/Group: Individual Therapy  Deniece [name] PT, DPT, CBIS  Supplemental Physical Therapist [facility]    Pager [phone] Acute Rehab Office [phone]   12/21/2017, 2:54 PM

## 2017-12-21 NOTE — Progress Notes (Signed)
Occupational Therapy Session Note  Patient Details  Name: ODESSA NISHI MRN: [id number] Date of Birth: 12/08/1934  Today's Date: 12/21/2017 OT Individual Time: 1030-1100 OT Individual Time Calculation (min): 30 min    Short Term Goals: Week 2:  OT Short Term Goal 1 (Week 2): Pt will complete 1 grooming task with AE and Min A OT Short Term Goal 2 (Week 2): Pt will complete UB dressing with Max A  OT Short Term Goal 3 (Week 2): Pts dtr will demonstrate carryover of pressure relief education by assisting pt with position changes during daily routine   Skilled Therapeutic Interventions/Progress Updates:    Patient in bed upon arrival, eager to participate in therapy and aware of needs.  She denies pain at this time LB dressing - max A of 2, UB dressing completed in SSP max A of one with 2nd to assist with sitting balance, Dep for TLSO Sitting balance activities with min A  beasy board transfer to power chair max A Patient to PT for next session  Therapy Documentation Precautions:  Precautions Precautions: Cervical, Back, Fall Precaution Comments: back precautions due to thoracic fx s/p ACDF; needs TLSO for all OOB activity but per order set does not need cervical collar  Required Braces or Orthoses: Spinal Brace Spinal Brace: Thoracolumbosacral orthotic, Applied in sitting position Restrictions Weight Bearing Restrictions: No General:   Vital Signs:   Pain: Pain Assessment Pain Scale: 0-10 Pain Score: 0-No pain Pain Type: Acute pain Pain Location: Hand Pain Orientation: Right;Left Pain Descriptors / Indicators: Aching;Burning Pain Frequency: Constant Pain Onset: On-going Patients Stated Pain Goal: 4 Pain Intervention(s): Medication (See eMAR)   Therapy/Group: Individual Therapy  Carlos Levering 12/21/2017, 12:35 PM

## 2017-12-21 NOTE — Progress Notes (Signed)
Occupational Therapy Session Note  Patient Details  Name: Sherry [name] MRN: [id number] Date of Birth: 08-08-1934  Today's Date: 12/21/2017 OT Individual Time: 1430-1546 OT Individual Time Calculation (min): 76 min    Short Term Goals: Week 2:  OT Short Term Goal 1 (Week 2): Pt will complete 1 grooming task with AE and Min A OT Short Term Goal 2 (Week 2): Pt will complete UB dressing with Max A  OT Short Term Goal 3 (Week 2): Pts dtr will demonstrate carryover of pressure relief education by assisting pt with position changes during daily routine   Skilled Therapeutic Interventions/Progress Updates:    Upon entering the room, pt reports extreme fatigue from prior therapy sessions. Pt required maximal participation for OT intervention this session. Pt utilized motorized wheelchair to navigate obstacles in order to get to day room. Second helper needed to place sling and pt transferred onto tilt table with use of maximove. Pt's BP in supine is 132/73. Pt's BP decreased to 120/61 and then 105/62 , respectively, once transitioning to 30 degrees and then 65. Pt with no symptoms and very happy to be bearing weight on B LEs with use of table. Pt able to activate quads in order to do 2 sets of 5 mini squats with some assist to return to full erect position. Pt transferred back onto bed with use of maxi as pt with increased fatigue. Rolling L <> R with max A to doff TLSO and sling. Pt's family member present and all needs within reach upon exiting the room.   Therapy Documentation Precautions:  Precautions Precautions: Cervical, Back, Fall Precaution Comments: back precautions due to thoracic fx s/p ACDF; needs TLSO for all OOB activity but per order set does not need cervical collar  Required Braces or Orthoses: Spinal Brace Spinal Brace: Thoracolumbosacral orthotic, Applied in sitting position Restrictions Weight Bearing Restrictions: No Pain: Pain Assessment Pain Scale: 0-10 Pain Score:  0-No pain Pain Type: Acute pain Pain Location: Hand Pain Orientation: Right;Left Pain Descriptors / Indicators: Aching;Burning Pain Frequency: Constant Pain Onset: On-going Patients Stated Pain Goal: 4 Pain Intervention(s): Medication (See eMAR) ADL: ADL Equipment Provided: Other (comment)(dorsal wrist cuff) Eating: Supervision/safety Where Assessed-Eating: Wheelchair Grooming: Not assessed Where Assessed-Grooming: Bed level Upper Body Bathing: Moderate assistance Where Assessed-Upper Body Bathing: Bed level Lower Body Bathing: Other (comment), Maximal assistance(2 helpers) Where Assessed-Lower Body Bathing: Bed level Upper Body Dressing: Maximal assistance, Other (Comment)(2 helpers) Where Assessed-Upper Body Dressing: Bed level Lower Body Dressing: Maximal assistance, Other (Comment)(2 helpers) Where Assessed-Lower Body Dressing: Bed level Toileting: Not assessed Toilet Transfer: Not assessed Walk-In Shower Transfer: Not assessed   Therapy/Group: Individual Therapy  [name] 12/21/2017, 3:47 PM

## 2017-12-21 NOTE — Progress Notes (Signed)
[facility] PHYSICAL MEDICINE & REHABILITATION PROGRESS NOTE   Subjective/Complaints: Slept better lat night. Pain under control. Feels that she's making gains in therapy.   ROS: Patient denies fever, rash, sore throat, blurred vision, nausea, vomiting, diarrhea, cough, shortness of breath or chest pain,  headache, or mood change.    Objective:   No results found. Recent Labs    12/19/17 0701  WBC 11.7*  HGB 10.2*  HCT 32.2*  PLT 376   Recent Labs    12/19/17 0701  NA 140  K 3.8  CL 109  CO2 26  GLUCOSE 103*  BUN 15  CREATININE 0.59  CALCIUM 8.3*    Intake/Output Summary (Last 24 hours) at 12/21/2017 0859 Last data filed at 12/21/2017 0122 Gross per 24 hour  Intake 600 ml  Output 2375 ml  Net -1775 ml     Physical Exam: Vital Signs Blood pressure 118/67, pulse 70, temperature 98.3 F (36.8 C), temperature source Oral, resp. rate 16, weight 79 kg, SpO2 94 %. Constitutional: No distress . Vital signs reviewed. HEENT: EOMI, oral membranes moist Neck: supple Cardiovascular: RRR without murmur. No JVD    Respiratory: CTA Bilaterally without wheezes or rales. Normal effort    GI: BS +, non-tender, non-distended   Musculoskeletal: Mild edema distally in LE's,trace in UE.  Neurological: Alert and oriented. Sensation diminished to light touch below T4, sl sense of pain Motor: RUE: Shoulder abduction, elbow flex 3+/5, elbow extension, wrist extension 1+ to 2-/5, hand gip 0/5  LUE: Shoulder abduction, elbow flexion 3-/5, wrist extension 2/5, hand gip 0/5,   R LE: 1+/5 proximal to distal, LLE 2/5 prox to 1+ to 2-/5 distally.  -no resting tone Skin: Incision c/d/i, warm and dry. Psychiatric: pleasant in good spirits.  Assessment/Plan: 1. 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  Physiatrist is providing close team supervision and 24 hour  management of active medical problems listed below.  Physiatrist and rehab team continue to assess barriers to discharge/monitor patient progress toward functional and medical goals  Care Tool:  Bathing  Bathing activity did not occur: Refused Body parts bathed by patient: Right arm, Left arm, Chest, Left upper leg, Right upper leg, Abdomen   Body parts bathed by helper: Front perineal area, Buttocks, Right lower leg, Left lower leg, Face Body parts n/a: Face, Front perineal area, Buttocks(previously washed )   Bathing assist Assist Level: 2 Helpers     Upper Body Dressing/Undressing Upper body dressing   What is the patient wearing?: Pull over shirt    Upper body assist Assist Level: Maximal Assistance - Patient 25 - 49%    Lower Body Dressing/Undressing Lower body dressing      What is the patient wearing?: Pants     Lower body assist Assist for lower body dressing: 2 Helpers     Toileting Toileting    Toileting assist Assist for toileting: Dependent - Patient 0%     Transfers Chair/bed transfer  Transfers assist     Chair/bed transfer assist level: 2 Helpers     Locomotion Ambulation   Ambulation assist   Ambulation activity did not occur: Safety/medical concerns          Walk 10 feet activity   Assist  Walk 10 feet activity did not occur: Safety/medical concerns        Walk 50 feet activity   Assist Walk 50 feet with 2  turns activity did not occur: Safety/medical concerns         Walk 150 feet activity   Assist Walk 150 feet activity did not occur: Safety/medical concerns         Walk 10 feet on uneven surface  activity   Assist Walk 10 feet on uneven surfaces activity did not occur: Safety/medical concerns         Wheelchair     Assist Will patient use wheelchair at discharge?: Yes Type of Wheelchair: Power Wheelchair activity did not occur: Safety/medical concerns  Wheelchair assist level:  Supervision/Verbal cueing Max wheelchair distance: 150'    Wheelchair 50 feet with 2 turns activity    Assist    Wheelchair 50 feet with 2 turns activity did not occur: Safety/medical concerns   Assist Level: Supervision/Verbal cueing   Wheelchair 150 feet activity     Assist Wheelchair 150 feet activity did not occur: Safety/medical concerns   Assist Level: Supervision/Verbal cueing     Medical Problem List and Plan: 1.  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  Status post C5-6 and C6-7 anterior cervical discectomy and fusion 11/29/2017.  TLSO back brace when out of bed  -Continue CIR therapies including PT, OT       2.  DVT Prophylaxis/Anticoagulation:  Acute mobile DVT Right common/proximal femoral vein  -IVCF placed by radiology  3. Pain Management:   Neurontin increased to 400mg  qid with improvement. Not oversedated    Valium 5 mg every 6 hours as needed muscle spasms  oxycodone as needed pain 4. Mood:   -ongoing ego support  -neuropsych   5. Neuropsych: This patient is capable of making decisions on her own behalf. 6. Skin/Wound Care: Routine skin checks  -ongoing edema control discussed  -sutures removed  -better nutrition will also help 7. Fluids/Electrolytes/Nutrition: encourage PO  -replacing potassium  - protein for hypoalbuminemia  -trial of megace---decreased to QD, eating better  -continue to follow labs   8.  Neurogenic bowel and bladder.  Flomax 0.4 mg daily.    -I/O caths    -AM bowel program, senna-s + HS, AM suppository.   Will change senna-s to just senna at bedtime 9.  Hypertension.  Norvasc 10 mg daily.   Vitals:   12/21/17 0600 12/21/17 0613  BP:  118/67  Pulse:  70  Resp:  16  Temp: 98.3 F (36.8 C)   SpO2:  94%   Controlled on 11/20 10. Acute blood loss anemia:     -hgb 10.9  11/11-->10.2 11/18----recheck again Thursday  -no signs of blood loss 11.  Leukocytosis  WBCs 11.7  11/18---follow up tomorrow   Urine with 100k E coli---pan-sensitive but changed macrobid to amoxil 500mg  tid---continue for amoxil for 7 days   LOS: 14 days A FACE TO FACE EVALUATION WAS PERFORMED  Meredith Staggers 12/21/2017, 8:59 AM

## 2017-12-22 ENCOUNTER — Inpatient Hospital Stay (HOSPITAL_COMMUNITY): Payer: Medicare HMO | Admitting: Physical Therapy

## 2017-12-22 ENCOUNTER — Inpatient Hospital Stay (HOSPITAL_COMMUNITY): Payer: Medicare HMO | Admitting: Occupational Therapy

## 2017-12-22 ENCOUNTER — Other Ambulatory Visit: Payer: Self-pay

## 2017-12-22 ENCOUNTER — Inpatient Hospital Stay (HOSPITAL_COMMUNITY): Payer: Medicare HMO

## 2017-12-22 LAB — BASIC METABOLIC PANEL
ANION GAP: 5 (ref 5–15)
BUN: 13 mg/dL (ref 8–23)
CALCIUM: 8.2 mg/dL — AB (ref 8.9–10.3)
CO2: 26 mmol/L (ref 22–32)
Chloride: 109 mmol/L (ref 98–111)
Creatinine, Ser: 0.69 mg/dL (ref 0.44–1.00)
GFR calc Af Amer: >60 mL/min (ref >60)
GFR calc non Af Amer: >60 mL/min (ref >60)
GLUCOSE: 113 mg/dL — AB (ref 70–99)
POTASSIUM: 3.7 mmol/L (ref 3.5–5.1)
Sodium: 140 mmol/L (ref 135–145)

## 2017-12-22 LAB — CBC
HCT: 32 % — ABNORMAL LOW (ref 36.0–46.0)
HEMOGLOBIN: 10.3 g/dL — AB (ref 12.0–15.0)
MCH: 28.8 pg (ref 26.0–34.0)
MCHC: 32.2 g/dL (ref 30.0–36.0)
MCV: 89.4 fL (ref 80.0–100.0)
Platelets: 316 10*3/uL (ref 150–400)
RBC: 3.58 MIL/uL — AB (ref 3.87–5.11)
RDW: 14.9 % (ref 11.5–15.5)
WBC: 8.9 10*3/uL (ref 4.0–10.5)
nRBC: 0 % (ref 0.0–0.2)

## 2017-12-22 NOTE — Progress Notes (Signed)
Nutrition Follow-up  DOCUMENTATION CODES:   Not applicable  INTERVENTION:   -Continue MVI with minerals daily -Continue 30 ml Prostat BID, each supplement provides 100 kcals and 15 grams protein -Continue afternoon snack daily  NUTRITION DIAGNOSIS:   Inadequate oral intake related to poor appetite, early satiety as evidenced by per patient/family report.  Ongoing  GOAL:   Patient will meet greater than or equal to 90% of their needs  Progressing  MONITOR:   PO intake, Labs, Skin, I & O's, Weight trends  REASON FOR ASSESSMENT:   Consult Poor PO  ASSESSMENT:   83 year old female with PMH significant for hypertension. Pt presented on 11/29/2017 after a fall with immediate quadriparesis. Cranial CT scan unremarkable for acute intracranial process. CT/MRI imaging showed C6-7 disc fracture and tear of anterior longitudinal ligament. Paraspinal muscle and interspinous edema from C1-C7 compatible ligament injury and muscle strain. Increased cord signal at C5 and C6. T11 coronal vertebral body fracture. Pt underwent C5-6, C6-7 anterior cervical discectomy and fusion on 11/29/2017 per Dr. Venetia Constable.  11/7 - s/p IVC filter placement  Unable to obtain weight due to pt in wheelchair.   Attempted to speak with pt x 2, however, pt receiving nursing care at time of visit. Briefly observed pt eating lunch with assistance of OT.   Meal completion has improved; noted 50-100%. Pt is also taking prescribed supplements per MAR.   Per MD notes, tentative discharge for 01/11/18.   Labs reviewed.   Diet Order:   Diet Order            Diet regular Room service appropriate? Yes; Fluid consistency: Thin  Diet effective now              EDUCATION NEEDS:   Education needs have been addressed  Skin:  Skin Assessment: Skin Integrity Issues: Skin Integrity Issues:: Incisions, Other (Comment), DTI DTI: right heel Incisions: left groin, neck Other: laceration to left elbow  Last  BM:  12/21/17  Height:   Ht Readings from Last 1 Encounters:  12/22/17 5\' 7"  (1.702 m)    Weight:   Wt Readings from Last 1 Encounters:  12/21/17 79 kg    Ideal Body Weight:  61.36 kg  BMI:  Body mass index is 27.28 kg/m.  Estimated Nutritional Needs:   Kcal:  1500-1700  Protein:  80-95 grams  Fluid:  1.5-1.7 L    Drewey Begue A. Jimmye Norman, RD, LDN, CDE Pager: [phone] After hours Pager: [phone]

## 2017-12-22 NOTE — Plan of Care (Signed)
  Problem: SCI BOWEL ELIMINATION Goal: RH STG MANAGE BOWEL WITH ASSISTANCE Description STG Manage Bowel with total Assistance.  Outcome: not progressing ; bowel program   Problem: SCI BLADDER ELIMINATION Goal: RH STG MANAGE BLADDER WITH MEDICATION WITH ASSISTANCE Description STG Manage Bladder With Medication With  Total Assistance.  Outcome: Not Progressing; in and out cath

## 2017-12-22 NOTE — Progress Notes (Signed)
Physical Therapy Session Note  Patient Details  Name: Sherry [name] MRN: [id number] Date of Birth: 03-21-1934  Today's Date: 12/22/2017 PT Individual Time: 1030-1100 PT Individual Time Calculation (min): 30 min   Short Term Goals: Week 2:  PT Short Term Goal 1 (Week 2): Pt will utilize UE hooking to assist with bed mobility without cues in 50% of observations PT Short Term Goal 2 (Week 2): Pt will utilize BUE prop to maintain static sitting balance with mod assist 50% of observations PT Short Term Goal 3 (Week 2): Pt will initiate power w/c mobility training PT Short Term Goal 4 (Week 2): Pt will maintain forward weight shift during transfer in/out of w/c with min assist  Skilled Therapeutic Interventions/Progress Updates:    Session focused on w/c mobility in power w/c on unit and off unit down to gift shop to simulate crowded community environment. Pt able to maneuver w/c on and off of elevator with verbal cues and extra time. Pt RUE fatigued while in gift shop, requiring assist with steering w/c back onto elevator and up to floor, then patient able to take over and return to room and reposition in room. Pt kept on slow speed for safety.   Therapy Documentation Precautions:  Precautions Precautions: Cervical, Back, Fall Precaution Comments: back precautions due to thoracic fx s/p ACDF; needs TLSO for all OOB activity but per order set does not need cervical collar  Required Braces or Orthoses: Spinal Brace Spinal Brace: Thoracolumbosacral orthotic, Applied in sitting position Restrictions Weight Bearing Restrictions: No  Pain:  c/o nerve pain in hands at end of session and coldness - RN notified to see if due to pain medication.     Therapy/Group: Individual Therapy  Canary Brim Ivory Broad, PT, DPT, CBIS  12/22/2017, 11:03 AM

## 2017-12-22 NOTE — Progress Notes (Signed)
Social Work Patient ID: Sherry [name], female   DOB: 06/21/1934, 83 y.o.   MRN: [id number]   Have reviewed team conference with pt and daughter. Both aware and agreeable with targeted d/c date of 12/11 and goals of moderate assistance w/c level.  They are aware that w/c seating eval has been scheduled for next week with [facility].  Pt and daughter pleased with progress so far.  Continue to follow.  Macy Polio, LCSW

## 2017-12-22 NOTE — Progress Notes (Signed)
[facility] PHYSICAL MEDICINE & REHABILITATION PROGRESS NOTE   Subjective/Complaints: No new issues today. Still worried about distal limb swelling she experiences at night  ROS: Patient denies fever, rash, sore throat, blurred vision, nausea, vomiting, diarrhea, cough, shortness of breath or chest pain,  headache, or mood change.    Objective:   No results found. Recent Labs    12/22/17 0424  WBC 8.9  HGB 10.3*  HCT 32.0*  PLT 316   Recent Labs    12/22/17 0424  NA 140  K 3.7  CL 109  CO2 26  GLUCOSE 113*  BUN 13  CREATININE 0.69  CALCIUM 8.2*    Intake/Output Summary (Last 24 hours) at 12/22/2017 0919 Last data filed at 12/22/2017 0814 Gross per 24 hour  Intake 960 ml  Output 2825 ml  Net -1865 ml     Physical Exam: Vital Signs Blood pressure (!) 111/57, pulse 71, temperature 98.3 F (36.8 C), temperature source Oral, resp. rate 18, height 5\' 7"  (1.702 m), weight 79 kg, SpO2 93 %. Constitutional: No distress . Vital signs reviewed. HEENT: EOMI, oral membranes moist Neck: supple Cardiovascular: RRR without murmur. No JVD    Respiratory: CTA Bilaterally without wheezes or rales. Normal effort    GI: BS +, non-tender, non-distended   Musculoskeletal: Mild edema distally in LE's,trace in UE.  Neurological: Alert and oriented. Sensation diminished to light touch below T4, sl sense of pain Motor: RUE: Shoulder abduction, elbow flex 3+/5, elbow extension, wrist extension 1+ to 2-/5, hand gip 0/5--stable LUE: Shoulder abduction, elbow flexion 3-/5, wrist extension 2/5, hand gip 0/5-stable exam   R LE: 1+/5 proximal to distal, LLE 2/5 prox to 1+ to 2-/5 distally-stable exam.  -no resting tone Skin: Incision c/d/i, . Psychiatric: pleasant  Assessment/Plan: 1. 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  Physiatrist is providing close  team supervision and 24 hour management of active medical problems listed below.  Physiatrist and rehab team continue to assess barriers to discharge/monitor patient progress toward functional and medical goals  Care Tool:  Bathing  Bathing activity did not occur: Refused Body parts bathed by patient: Right arm, Left arm, Chest, Left upper leg, Right upper leg, Abdomen   Body parts bathed by helper: Front perineal area, Buttocks, Right lower leg, Left lower leg, Face Body parts n/a: Face, Front perineal area, Buttocks(previously washed )   Bathing assist Assist Level: 2 Helpers     Upper Body Dressing/Undressing Upper body dressing   What is the patient wearing?: Pull over shirt    Upper body assist Assist Level: Maximal Assistance - Patient 25 - 49%    Lower Body Dressing/Undressing Lower body dressing      What is the patient wearing?: Pants     Lower body assist Assist for lower body dressing: 2 Helpers     Toileting Toileting    Toileting assist Assist for toileting: Dependent - Patient 0%     Transfers Chair/bed transfer  Transfers assist     Chair/bed transfer assist level: Dependent - mechanical lift     Locomotion Ambulation   Ambulation assist   Ambulation activity did not occur: Safety/medical concerns          Walk 10 feet activity   Assist  Walk 10 feet activity did not occur: Safety/medical concerns        Walk 50 feet activity   Assist Walk 50  feet with 2 turns activity did not occur: Safety/medical concerns         Walk 150 feet activity   Assist Walk 150 feet activity did not occur: Safety/medical concerns         Walk 10 feet on uneven surface  activity   Assist Walk 10 feet on uneven surfaces activity did not occur: Safety/medical concerns         Wheelchair     Assist Will patient use wheelchair at discharge?: Yes Type of Wheelchair: Power Wheelchair activity did not occur: Safety/medical  concerns  Wheelchair assist level: Supervision/Verbal cueing Max wheelchair distance: 150    Wheelchair 50 feet with 2 turns activity    Assist    Wheelchair 50 feet with 2 turns activity did not occur: Safety/medical concerns   Assist Level: Supervision/Verbal cueing   Wheelchair 150 feet activity     Assist Wheelchair 150 feet activity did not occur: Safety/medical concerns   Assist Level: Supervision/Verbal cueing     Medical Problem List and Plan: 1.  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  Status post C5-6 and C6-7 anterior cervical discectomy and fusion 11/29/2017.  TLSO back brace when out of bed  -Continue CIR therapies including PT, OT       2.  DVT Prophylaxis/Anticoagulation:  Acute mobile DVT Right common/proximal femoral vein  -IVCF placed by radiology  3. Pain Management:   Neurontin  400mg  qid with improvement and not oversedated    Valium 5 mg every 6 hours as needed muscle spasms  oxycodone as needed for pain 4. Mood:   -ongoing ego support, positive reinforcement  -neuropsych   5. Neuropsych: This patient is capable of making decisions on her own behalf. 6. Skin/Wound Care: Routine skin checks  -ongoing edema control discussed--->elevate limbs when possible  -sutures removed from left elbow  -better nutrition will also help 7. Fluids/Electrolytes/Nutrition: encourage PO  -replacing potassium  - protein for hypoalbuminemia  -trial of megace---decreased to QD, eating better 75-100%  -I personally reviewed the patient's labs today.   8.  Neurogenic bowel and bladder.  Flomax 0.4 mg daily.    -I/O caths    -AM bowel program, senna-s + HS, AM suppository.      - changed senna-s to just senna at bedtime 9.  Hypertension.  Norvasc 10 mg daily.   Vitals:   12/21/17 2108 12/22/17 0649  BP: (!) 113/55 (!) 111/57  Pulse: 86 71  Resp: 18 18  Temp: 97.7 F (36.5 C) 98.3 F (36.8 C)  SpO2: 93% 93%    Controlled on 11/21 10. Acute blood loss anemia:     -hgb 10.9  11/11-->10.2 11/18--> 10.3 11/21   -no signs of blood loss  -improving nutritional status 11.  Leukocytosis  WBCs 11.7 11/18--->down to 8.9 11/21  Urine with 100k E coli---continue amoxil for 7 day course   LOS: 15 days A FACE TO FACE EVALUATION WAS PERFORMED  Meredith Staggers 12/22/2017, 9:19 AM

## 2017-12-22 NOTE — Progress Notes (Signed)
Occupational Therapy Session Note  Patient Details  Name: Sherry [name] MRN: [id number] Date of Birth: 1935/01/06  Today's Date: 12/22/2017 OT Individual Time: 3810-1751 OT Individual Time Calculation (min): 104 min    Short Term Goals: Week 2:  OT Short Term Goal 1 (Week 2): Pt will complete 1 grooming task with AE and Min A OT Short Term Goal 2 (Week 2): Pt will complete UB dressing with Max A  OT Short Term Goal 3 (Week 2): Pts dtr will demonstrate carryover of pressure relief education by assisting pt with position changes during daily routine   Skilled Therapeutic Interventions/Progress Updates:    Upon entering the room, pt seated in power wheelchair with no c/o pain and reports having a great morning. Pt's lunch tray has arrived and she was able to demonstrate manipulation of buttons to tilt wheelchair to standard sitting position. OT placed universal cuff on R UE and cut food items up on tray. Pt able to feed herself meal with increased time but task does use up quite a bit of energy for pt to complete. Removal of universal cuff for pt to lace hands around cup and bring to mouth to drink. Pt transferred from wheelchair >bed with beasy board with total A and 2 helpers for safety. Sit >supine with +2 for safety as well. RN and nurse tech present for cath. All needs within reach.   Therapy Documentation Precautions:  Precautions Precautions: Cervical, Back, Fall Precaution Comments: back precautions due to thoracic fx s/p ACDF; needs TLSO for all OOB activity but per order set does not need cervical collar  Required Braces or Orthoses: Spinal Brace Spinal Brace: Thoracolumbosacral orthotic, Applied in sitting position Restrictions Weight Bearing Restrictions: No  Pain: Pain Assessment Pain Scale: 0-10 Pain Score: 8  Pain Type: Acute pain;Neuropathic pain Pain Location: Hand Pain Orientation: Right;Left Pain Descriptors / Indicators: Aching;Numbness Pain Frequency:  Constant Pain Onset: On-going Pain Intervention(s): Medication (See eMAR) ADL: ADL Equipment Provided: Other (comment)(dorsal wrist cuff) Eating: Supervision/safety Where Assessed-Eating: Wheelchair Grooming: Not assessed Where Assessed-Grooming: Bed level Upper Body Bathing: Moderate assistance Where Assessed-Upper Body Bathing: Bed level Lower Body Bathing: Other (comment), Maximal assistance(2 helpers) Where Assessed-Lower Body Bathing: Bed level Upper Body Dressing: Maximal assistance, Other (Comment)(2 helpers) Where Assessed-Upper Body Dressing: Bed level Lower Body Dressing: Maximal assistance, Other (Comment)(2 helpers) Where Assessed-Lower Body Dressing: Bed level Toileting: Not assessed Toilet Transfer: Not assessed Walk-In Shower Transfer: Not assessed   Therapy/Group: Individual Therapy  [name] 12/22/2017, 1:39 PM

## 2017-12-22 NOTE — Progress Notes (Signed)
Physical Therapy Session Note  Patient Details  Name: Sherry [name] MRN: [id number] Date of Birth: January 17, 1935  Today's Date: 12/22/2017 PT Individual Time: 0900-1000 PT Individual Time Calculation (min): 60 min   Short Term Goals: Week 2:  PT Short Term Goal 1 (Week 2): Pt will utilize UE hooking to assist with bed mobility without cues in 50% of observations PT Short Term Goal 2 (Week 2): Pt will utilize BUE prop to maintain static sitting balance with mod assist 50% of observations PT Short Term Goal 3 (Week 2): Pt will initiate power w/c mobility training PT Short Term Goal 4 (Week 2): Pt will maintain forward weight shift during transfer in/out of w/c with min assist  Skilled Therapeutic Interventions/Progress Updates:    no c/o pain.  Session focus on functional mobility for ADLs and core strengthening for transfers.    Pt continues to require +2 for lower body dressing at bed level, max assist +1 with multimodal cues for hooking UE and pushing through LE to roll L and R and maintain side lying.  Supine>sit via log roll to R with max assist to roll and +2 to come to sitting EOB.  Pt initially leaning backwards upon sitting, requiring verbal/tactile cues to shift weight forward to maintain balance.  Lateral lean to R to place beasy board, max +1 to return to sitting but +2 for board placement.  +2 for beasy board transfer to w/c on L.  W/C mobility throughout unit with supervision on slow speed.  In therapy gym, attempted forward weight shifts from tilted chair with 1# dowel rod but pt unable to hold onto rod.  Transition to unweighted forward weight shifts 2x5 reps from tilt chair focus on core strengthening and use of momentum/head control to assist with shifting.  Verbal cues for technique and for breathing throughout exercise.  Returned to room at end of session and positioned with call bell in reach and needs met.   Therapy Documentation Precautions:  Precautions Precautions:  Cervical, Back, Fall Precaution Comments: back precautions due to thoracic fx s/p ACDF; needs TLSO for all OOB activity but per order set does not need cervical collar  Required Braces or Orthoses: Spinal Brace Spinal Brace: Thoracolumbosacral orthotic, Applied in sitting position Restrictions Weight Bearing Restrictions: No    Therapy/Group: Individual Therapy  Michel Santee 12/22/2017, 10:59 AM

## 2017-12-22 NOTE — Patient Care Conference (Signed)
Inpatient RehabilitationTeam Conference and Plan of Care Update Date: 12/20/2017   Time: 2:00 PM    Patient Name: Sherry [name]      Medical Record Number: [id number]  Date of Birth: [birth year] Sex: Female         Room/Bed: 4W13C/4W13C-01 Payor Info: Payor: [employer] MEDICARE / Plan: [employer] MEDICARE HMO/PPO / Product Type: *No Product type* /    Admitting Diagnosis: Fall with SCI   central cord syndrome  Admit Date/Time:  12/07/2017  5:47 PM Admission Comments: No comment available   Primary Diagnosis:  <principal problem not specified> Principal Problem: <principal problem not specified>  Patient Active Problem List   Diagnosis Date Noted  . Hypokalemia   . DVT (deep vein thrombosis) in pregnancy   . Leukocytosis   . Essential hypertension   . Trauma   . 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([place])   . Neuropathic pain   . Neurogenic bowel   . Neurogenic bladder   . Benign essential HTN   . Acute blood loss anemia   . Central cord syndrome at C6 level of cervical spinal cord ([place]) 11/29/2017  . Central cord syndrome [facility]) 11/29/2017    Expected Discharge Date: Expected Discharge Date: 01/11/18  Team Members Present: Physician leading conference: Dr. Alger Simons Social Worker Present: Lennart Pall, LCSW Nurse Present: Rozetta Nunnery, RN PT Present: Dwyane Dee, PT OT Present: Willeen Cass, OT SLP Present: Weston Anna, SLP PPS Coordinator present : Daiva Nakayama, RN, CRRN     Current Status/Progress Goal Weekly Team Focus  Medical   Improved pain control.  Bowel program more consistent but still needs work.  Minimal neurological gains although patient working hard  See prior, continue working on consistent bowel and bladder program  See above, nutrition improving but still needs work   Bowel/Bladder   Patient is incontinent of Bowel and Bladder. Pt has a neurogenic bowel and bladder. Bowel program and scheduled PVRs and I & O caths ordered. LBM 12/19/17  Continue to assess with toileting  needs.   Continue to assess with toileting needs.    Swallow/Nutrition/ Hydration             ADL's   Total A+2 for ADLs + slideboard transfers   mod A  Adaptive self care skills, balance, functional trasnfers, NMR, pt/family education    Mobility   total +2, demo better trunk control and returning movement in BLEs  mod bed and transfers, max car transfer, supervision w/c (likely power)  education, balance, functional mobility progression, activity tolerance   Communication             Safety/Cognition/ Behavioral Observations            Pain   C/O 9/10 hands, administer prn pain regimen as ordered.   Maintain pain level 3/10  Assess pain every shift, administer pain regimen as ordered   Skin   LUE sutures need order for removal, incision neck  No S/s infection   Will continue to assess skin every shift.     Rehab Goals Patient on target to meet rehab goals: Yes *See Care Plan and progress notes for long and short-term goals.     Barriers to Discharge  Current Status/Progress Possible Resolutions Date Resolved   Physician    Medical stability;Neurogenic Bowel & Bladder        Ongoing medical management as above      Nursing  PT                    OT                  SLP                SW                Discharge Planning/Teaching Needs:  Pt to d/c home with family to provide 24/7 assistance.  Daughter already beginning home modifications.  Teaching ongoing.  Daughter here frequently.   Team Discussion:  Pain issues continue to be addressed.  Working on b/b program.  Some iproved appetite;  Treating UTI.  Total +2 with transfers but improved truck control.  Mod-max UB b/d and using AE for feeding.  Plan for power w/c seating 11/27 with [facility]. Pt remains very motivated!  Revisions to Treatment Plan:  NA    Continued Need for Acute Rehabilitation Level of Care: The patient requires daily medical management by a physician with specialized training in  physical medicine and rehabilitation for the following conditions: Daily direction of a multidisciplinary physical rehabilitation program to ensure safe treatment while eliciting the highest outcome that is of practical value to the patient.: Yes Daily medical management of patient stability for increased activity during participation in an intensive rehabilitation regime.: Yes Daily analysis of laboratory values and/or radiology reports with any subsequent need for medication adjustment of medical intervention for : Neurological problems;Urological problems   I attest that I was present, lead the team conference, and concur with the assessment and plan of the team.   Yesena Reaves 12/22/2017, 10:13 AM

## 2017-12-23 ENCOUNTER — Inpatient Hospital Stay (HOSPITAL_COMMUNITY): Payer: Medicare HMO | Admitting: Occupational Therapy

## 2017-12-23 ENCOUNTER — Inpatient Hospital Stay (HOSPITAL_COMMUNITY): Payer: Medicare HMO | Admitting: Physical Therapy

## 2017-12-23 ENCOUNTER — Other Ambulatory Visit: Payer: Self-pay

## 2017-12-23 NOTE — Progress Notes (Signed)
Occupational Therapy Session Note  Patient Details  Name: Sherry [name] MRN: [id number] Date of Birth: 11-09-1934  Today's Date: 12/23/2017 OT Individual Time: 4332-9518 OT Individual Time Calculation (min): 73 min   Short Term Goals: Week 2:  OT Short Term Goal 1 (Week 2): Pt will complete 1 grooming task with AE and Min A OT Short Term Goal 2 (Week 2): Pt will complete UB dressing with Max A  OT Short Term Goal 3 (Week 2): Pts dtr will demonstrate carryover of pressure relief education by assisting pt with position changes during daily routine   Skilled Therapeutic Interventions/Progress Updates:    Pt greeted in bed, eating breakfast using wrist cuff with setup. Once she was finished, LB dressing completed bedlevel with pt rolling Rt>Lt with 2 assist. With [facility] for grasp, pt assisted with pulling pants up past thighs. Total A for thigh high Teds and gripper socks. 2 helpers for supine<sit EOB via logroll technique and TLSO brace donned. She returned to supine for maxi slide transfer to tilt table. On tilt table, worked on LE weightbearing/strengthening and UE AROM while pt was positioned at 40 and then 60 degrees. Pt completed mini squats x5 rep intervals with manual facilitation for knee flexion and extension. Pt able to assist Lt>Rt. B UE arm raises x5 rep intervals with instruction on breathing. Afterwards pt transferred back to bed via maxi slides with 2 assist. She was repositioned for comfort and boosted up in bed. Pt left with soft call bell within reach and k-pad for hand comfort. Bed alarm set.    BP in supine on tilt table: 123/69 BP 1 minute in 40 degrees: 113/60 BP: 6 minutes in 60 degrees: 97/57 Pt asymptomatic throughout   Therapy Documentation Precautions:  Precautions Precautions: Cervical, Back, Fall Precaution Comments: back precautions due to thoracic fx s/p ACDF; needs TLSO for all OOB activity but per order set does not need cervical collar  Required Braces or  Orthoses: Spinal Brace Spinal Brace: Thoracolumbosacral orthotic, Applied in sitting position Restrictions Weight Bearing Restrictions: No Pain: Pt premedicated at start of session.   ADL: ADL Equipment Provided: Other (comment)(dorsal wrist cuff) Eating: Supervision/safety Where Assessed-Eating: Wheelchair Grooming: Not assessed Where Assessed-Grooming: Bed level Upper Body Bathing: Moderate assistance Where Assessed-Upper Body Bathing: Bed level Lower Body Bathing: Other (comment), Maximal assistance(2 helpers) Where Assessed-Lower Body Bathing: Bed level Upper Body Dressing: Maximal assistance, Other (Comment)(2 helpers) Where Assessed-Upper Body Dressing: Bed level Lower Body Dressing: Maximal assistance, Other (Comment)(2 helpers) Where Assessed-Lower Body Dressing: Bed level Toileting: Not assessed Toilet Transfer: Not assessed Walk-In Shower Transfer: Not assessed      Therapy/Group: Individual Therapy  Zackory Pudlo A Ismail Graziani 12/23/2017, 12:09 PM

## 2017-12-23 NOTE — Progress Notes (Signed)
Physical Therapy Weekly Progress Note  Patient Details  Name: Sherry [name] MRN: [id number] Date of Birth: Aug 18, 1934  Beginning of progress report period: December 16, 2017 End of progress report period: December 23, 2017  Today's Date: 12/23/2017 PT Individual Time: 1300-1400 PT Individual Time Calculation (min): 60 min   Patient has met 2 of 4 short term goals.  Pt continues to make slow progress towards LTGs.  She continues to require +2 assist for all functional mobility, but demonstrates improvement in trunk control and ability to forward weight shift for transfers.  Patient continues to demonstrate the following deficits muscle weakness and muscle paralysis, abnormal tone, unbalanced muscle activation and decreased coordination and decreased sitting balance, decreased standing balance, decreased postural control, decreased balance strategies and tetraplegia and therefore will continue to benefit from skilled PT intervention to increase functional independence with mobility.  Patient progressing toward long term goals..  Continue plan of care.  PT Short Term Goals Week 2:  PT Short Term Goal 1 (Week 2): Pt will utilize UE hooking to assist with bed mobility without cues in 50% of observations PT Short Term Goal 1 - Progress (Week 2): Not met PT Short Term Goal 2 (Week 2): Pt will utilize BUE prop to maintain static sitting balance with mod assist 50% of observations PT Short Term Goal 2 - Progress (Week 2): Met PT Short Term Goal 3 (Week 2): Pt will initiate power w/c mobility training PT Short Term Goal 3 - Progress (Week 2): Not met PT Short Term Goal 4 (Week 2): Pt will maintain forward weight shift during transfer in/out of w/c with min assist PT Short Term Goal 4 - Progress (Week 2): Met Week 3:  PT Short Term Goal 1 (Week 3): Pt will utilize hooking to assist with maintaining side lying in bed in 75% of observations without cues.  PT Short Term Goal 2 (Week 3): Pt will  initiate static standing with lift equipment x2 minutes PT Short Term Goal 3 (Week 3): Pt will maintain static sitting balance with min assist in 50% of observations  Skilled Therapeutic Interventions/Progress Updates:    c/o 5/10 pain, premedicated.  Session focus on sit<>stand with sara + and functional transfers.    Pt requires +2 to transition to EOB with HOB elevated, demonstrating decreased ability to roll with HOB elevated.  Attempted 2x for sit<>stand with sara+ but pt unable to demonstrate enough LE activation against gravity to maintain safety.  Rest breaks between trials with focus on maintaining forward weight shift to maintain static sitting balance with UE support on sara+.  Pt continues to require up to max assist for forward weight shift when EOB.  Beasy board transfer to w/c on L with +2 assist and pt positioned in w/c with +2.  Discussed switching w/c cushion out for 18" wide cushion for better support and increased sitting tolerance.  PT adjusted ELRs on leg rests for improved sitting tolerance.  Pt left upright in w/c with call bell in reach and needs met.  Therapy Documentation Precautions:  Precautions Precautions: Cervical, Back, Fall Precaution Comments: back precautions due to thoracic fx s/p ACDF; needs TLSO for all OOB activity but per order set does not need cervical collar  Required Braces or Orthoses: Spinal Brace Spinal Brace: Thoracolumbosacral orthotic, Applied in sitting position Restrictions Weight Bearing Restrictions: No   Therapy/Group: Individual Therapy  Michel Santee 12/23/2017, 2:03 PM

## 2017-12-23 NOTE — Progress Notes (Signed)
Physical Therapy Session Note  Patient Details  Name: Sherry [name] MRN: [id number] Date of Birth: [birth year]  Today's Date: 12/23/2017 PT Individual Time: 6940-9828 PT Individual Time Calculation (min): 58 min   Short Term Goals: Week 3:  PT Short Term Goal 1 (Week 3): Pt will utilize hooking to assist with maintaining side lying in bed in 75% of observations without cues.  PT Short Term Goal 2 (Week 3): Pt will initiate static standing with lift equipment x2 minutes PT Short Term Goal 3 (Week 3): Pt will maintain static sitting balance with min assist in 50% of observations  Skilled Therapeutic Interventions/Progress Updates:    Patient received up in power chair, pleasant and willing to work with therapy today. Focused session on navigation and driving of power WC with ongoing cues to look up where she is driving for safety and efficient navigation of chair. Able to drive power WC approximately 850f and worked on pFirefighterchair up in straight line and also backing up into L/R turns with the chair as well as on obstacle navigation. Significant difficulty in navigating chair in reverse today despite cues and education from PT, also significant difficulty in effective and safe obstacle navigation today. Required assistance in driving chair back to therapy unit due to weakness/cramping in hand following WC practice. Performed stretching of bilateral gastrocs and returned patient in power chair to her room with family present and all needs met this afternoon.   Therapy Documentation Precautions:  Precautions Precautions: Cervical, Back, Fall Precaution Comments: back precautions due to thoracic fx s/p ACDF; needs TLSO for all OOB activity but per order set does not need cervical collar  Required Braces or Orthoses: Spinal Brace Spinal Brace: Thoracolumbosacral orthotic, Applied in sitting position Restrictions Weight Bearing Restrictions: No General:   Vital  Signs:  Pain: Pain Assessment Pain Scale: 0-10 Pain Score: 0-No pain Pain Type: Neuropathic pain Pain Location: Hand Pain Orientation: Right;Left Pain Descriptors / Indicators: Aching;Burning Pain Frequency: Constant Pain Onset: On-going Patients Stated Pain Goal: 4 Pain Intervention(s): Medication (See eMAR)(tylenol and oxycodone)    Therapy/Group: Individual Therapy  KDeniece ReePT, DPT, CBIS  Supplemental Physical Therapist [facility]   Pager [phone] Rehab Office [phone]  12/23/2017, 3:49 PM

## 2017-12-23 NOTE — Progress Notes (Signed)
Physical Therapy Session Note  Patient Details  Name: Sherry [name] MRN: [id number] Date of Birth: [birth year]  Today's Date: 12/23/2017 PT Individual Time: 1000-1100 PT Individual Time Calculation (min): 60 min   Short Term Goals: Week 2:  PT Short Term Goal 1 (Week 2): Pt will utilize UE hooking to assist with bed mobility without cues in 50% of observations PT Short Term Goal 2 (Week 2): Pt will utilize BUE prop to maintain static sitting balance with mod assist 50% of observations PT Short Term Goal 3 (Week 2): Pt will initiate power w/c mobility training PT Short Term Goal 4 (Week 2): Pt will maintain forward weight shift during transfer in/out of w/c with min assist  Skilled Therapeutic Interventions/Progress Updates: Pt received seated in bed, denies pain and agreeable to treatment. Supine>sit with HOB elevated maxA +2. Transfer bed>w/c<>nustep with transfer board and maxA, +2 to stabilize board during transfer. Performed BUE/BLE nustep with LE and grip attachments to maintain neutral alignment; level 1 with total 8 min, slow speed but able to demo AROM through extremities, R <LLE activation. TotalA +2 transfer nustep>w/c with slight uphill transfer. Reviewed pressure relief with pt; demo'ed recline with power w/c features and pt able to repeat demonstration. Power w/c propulsion with S to return to room; requires minA to back into small space beside bed d/t difficulty navigating backwards. Remained in w/c, all needs in reach, seatbelt intact.      Therapy Documentation Precautions:  Precautions Precautions: Cervical, Back, Fall Precaution Comments: back precautions due to thoracic fx s/p ACDF; needs TLSO for all OOB activity but per order set does not need cervical collar  Required Braces or Orthoses: Spinal Brace Spinal Brace: Thoracolumbosacral orthotic, Applied in sitting position Restrictions Weight Bearing Restrictions: No    Therapy/Group: Individual  Therapy  Corliss Skains 12/23/2017, 11:51 AM

## 2017-12-23 NOTE — Plan of Care (Signed)
  Problem: Consults Goal: RH SPINAL CORD INJURY PATIENT EDUCATION Description  See Patient Education module for education specifics.  Outcome: Progressing   Problem: SCI BOWEL ELIMINATION Goal: RH STG MANAGE BOWEL WITH ASSISTANCE Description STG Manage Bowel with total Assistance.  Outcome: Progressing Goal: RH STG SCI MANAGE BOWEL PROGRAM W/ASSIST OR AS APPROPRIATE Description STG SCI Manage bowel program w/total assist or as appropriate.  Outcome: Progressing   Problem: SCI BLADDER ELIMINATION Goal: RH STG MANAGE BLADDER WITH MEDICATION WITH ASSISTANCE Description STG Manage Bladder With Medication With  Total Assistance.  Outcome: Progressing   Problem: RH SKIN INTEGRITY Goal: RH STG SKIN FREE OF INFECTION/BREAKDOWN Description With total assist  Outcome: Progressing Goal: RH STG MAINTAIN SKIN INTEGRITY WITH ASSISTANCE Description STG Maintain Skin Integrity With total Assistance.  Outcome: Progressing   Problem: RH SAFETY Goal: RH STG ADHERE TO SAFETY PRECAUTIONS W/ASSISTANCE/DEVICE Description STG Adhere to Safety Precautions With max Assistance/Device.  Outcome: Progressing   Problem: RH PAIN MANAGEMENT Goal: RH STG PAIN MANAGED AT OR BELOW PT'S PAIN GOAL Description At or below level 6  Outcome: Progressing   Problem: RH KNOWLEDGE DEFICIT SCI Goal: RH STG INCREASE KNOWLEDGE OF SELF CARE AFTER SCI Description Pt and family will be able to direct care post discharge using handout/resources independently  Outcome: Progressing

## 2017-12-23 NOTE — Progress Notes (Signed)
[place] PHYSICAL MEDICINE & REHABILITATION PROGRESS NOTE   Subjective/Complaints: Had a reasonable night. Pain seems better control. Had questions about bowel/bladder program  ROS: Patient denies fever, rash, sore throat, blurred vision, nausea, vomiting, diarrhea, cough, shortness of breath or chest pain,  headache, or mood change.     Objective:   No results found. Recent Labs    12/22/17 0424  WBC 8.9  HGB 10.3*  HCT 32.0*  PLT 316   Recent Labs    12/22/17 0424  NA 140  K 3.7  CL 109  CO2 26  GLUCOSE 113*  BUN 13  CREATININE 0.69  CALCIUM 8.2*    Intake/Output Summary (Last 24 hours) at 12/23/2017 0930 Last data filed at 12/23/2017 0500 Gross per 24 hour  Intake 840 ml  Output 2525 ml  Net -1685 ml     Physical Exam: Vital Signs Blood pressure (!) 100/50, pulse 78, temperature 98.1 F (36.7 C), resp. rate 17, height 5\' 7"  (1.702 m), weight 79 kg, SpO2 92 %. Constitutional: No distress . Vital signs reviewed. HEENT: EOMI, oral membranes moist Neck: supple Cardiovascular: RRR without murmur. No JVD    Respiratory: CTA Bilaterally without wheezes or rales. Normal effort    GI: BS +, non-tender, non-distended   Musculoskeletal: Mild edema distally in LE's and UE.  Neurological: Alert and oriented. Sensation diminished to light touch below T4, sl sense of pain Motor: RUE: Shoulder abduction, elbow flex 3+/5, elbow extension, wrist extension 1+ to 2-/5, hand gip 0/5--no changes LUE: Shoulder abduction, elbow flexion 3-/5, wrist extension 2/5, hand gip 0/5--stable R LE: 1+/5 proximal to distal, LLE 2/5 prox to 1+ to 2-/5 distally-stable exam.  -no resting tone Skin: Incision c/d/i, . Psychiatric:pleasant  Assessment/Plan: 1. 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  Physiatrist is providing close team supervision and 24 hour  management of active medical problems listed below.  Physiatrist and rehab team continue to assess barriers to discharge/monitor patient progress toward functional and medical goals  Care Tool:  Bathing  Bathing activity did not occur: Refused Body parts bathed by patient: Right arm, Left arm, Chest, Left upper leg, Right upper leg, Abdomen   Body parts bathed by helper: Front perineal area, Buttocks, Right lower leg, Left lower leg, Face Body parts n/a: Face, Front perineal area, Buttocks(previously washed )   Bathing assist Assist Level: 2 Helpers     Upper Body Dressing/Undressing Upper body dressing   What is the patient wearing?: Pull over shirt    Upper body assist Assist Level: Maximal Assistance - Patient 25 - 49%    Lower Body Dressing/Undressing Lower body dressing      What is the patient wearing?: Pants     Lower body assist Assist for lower body dressing: 2 Helpers     Toileting Toileting    Toileting assist Assist for toileting: Dependent - Patient 0%     Transfers Chair/bed transfer  Transfers assist     Chair/bed transfer assist level: 2 Helpers     Locomotion Ambulation   Ambulation assist   Ambulation activity did not occur: Safety/medical concerns          Walk 10 feet activity   Assist  Walk 10 feet activity did not occur: Safety/medical concerns        Walk 50 feet activity   Assist Walk 50 feet with 2 turns activity did not occur:  Safety/medical concerns         Walk 150 feet activity   Assist Walk 150 feet activity did not occur: Safety/medical concerns         Walk 10 feet on uneven surface  activity   Assist Walk 10 feet on uneven surfaces activity did not occur: Safety/medical concerns         Wheelchair     Assist Will patient use wheelchair at discharge?: Yes Type of Wheelchair: Power Wheelchair activity did not occur: Safety/medical concerns  Wheelchair assist level:  Supervision/Verbal cueing Max wheelchair distance: 150    Wheelchair 50 feet with 2 turns activity    Assist    Wheelchair 50 feet with 2 turns activity did not occur: Safety/medical concerns   Assist Level: Supervision/Verbal cueing   Wheelchair 150 feet activity     Assist Wheelchair 150 feet activity did not occur: Safety/medical concerns   Assist Level: Supervision/Verbal cueing     Medical Problem List and Plan: 1.  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  Status post C5-6 and C6-7 anterior cervical discectomy and fusion 11/29/2017.  TLSO back brace when out of bed  -Continue CIR therapies including PT, OT   -reviewed plan for bowels and bladder with pt/daughter.      2.  DVT Prophylaxis/Anticoagulation:  Acute mobile DVT Right common/proximal femoral vein  -IVCF placed by radiology  3. Pain Management:   Neurontin  400mg  qid with improvement and not oversedated    Valium 5 mg every 6 hours as needed muscle spasms  oxycodone as needed for pain 4. Mood:   -ongoing ego support, positive reinforcement  -neuropsych   5. Neuropsych: This patient is capable of making decisions on her own behalf. 6. Skin/Wound Care: Routine skin checks  - elevate limbs when possible  -sutures removed from left elbow  -better nutrition will also help edema mgt 7. Fluids/Electrolytes/Nutrition: encourage PO  -replacing potassium  - protein for hypoalbuminemia  -continue QD megace, eating better 75-100%     8.  Neurogenic bowel and bladder.  Flomax 0.4 mg daily.    -I/O caths    -AM bowel program, senna-s + HS, AM suppository.      -  senna-s to just senna at bedtime   -bowel movements more on schedule in AM, less incontinence 9.  Hypertension.  Norvasc 10 mg daily.   Vitals:   12/22/17 1935 12/23/17 0624  BP: 125/67 (!) 100/50  Pulse: 84 78  Resp: 18 17  Temp: 98.6 F (37 C) 98.1 F (36.7 C)  SpO2: 96% 92%   Controlled on  11/22 10. Acute blood loss anemia:     -hgb 10.9  11/11-->10.2 11/18--> 10.3 11/21   -no signs of blood loss  -improving nutritional status 11.  Leukocytosis  WBCs 11.7 11/18--->down to 8.9 11/21  Urine with 100k E coli---amoxil for 7 days   LOS: 16 days A FACE TO FACE EVALUATION WAS PERFORMED  Meredith Staggers 12/23/2017, 9:30 AM

## 2017-12-24 ENCOUNTER — Inpatient Hospital Stay (HOSPITAL_COMMUNITY): Payer: Medicare HMO | Admitting: Occupational Therapy

## 2017-12-24 DIAGNOSIS — B9620 Unspecified Escherichia coli [E. coli] as the cause of diseases classified elsewhere: Secondary | ICD-10-CM

## 2017-12-24 DIAGNOSIS — N390 Urinary tract infection, site not specified: Secondary | ICD-10-CM

## 2017-12-24 NOTE — Progress Notes (Signed)
Sherry [name] is a 83 y.o. female May 18, 1934 [id number]  Subjective: Sherry [name] reports feeling well today.  Request use of skin cream for bottom with cleansing and assistance with opening lozenge for dry mouth.  Also request incentive spirometer for personal use  Objective: Vital signs in last 24 hours: Temp:  [98.1 F (36.7 C)-98.8 F (37.1 C)] 98.5 F (36.9 C) (11/23 0510) Pulse Rate:  [71-85] 71 (11/23 0510) Resp:  [18] 18 (11/23 0510) BP: (104-116)/(59-63) 104/63 (11/23 0510) SpO2:  [96 %-97 %] 96 % (11/23 0510) Weight change:  Last BM Date: 12/23/17  Intake/Output from previous day: 11/22 0701 - 11/23 0700 In: 480 [P.O.:480] Out: 4050 [Urine:4050]  Physical Exam General: No apparent distress   tetraplegia of upper extremities unchanged noted with putting lozenge into mouth Lungs: Normal effort. Lungs clear to auscultation, no crackles or wheezes. Cardiovascular: Regular rate and rhythm, no edema Neurological: No new neurological deficits.  Tetraplegia without change  Lab Results: BMET    Component Value Date/Time   NA 140 12/22/2017 0424   K 3.7 12/22/2017 0424   CL 109 12/22/2017 0424   CO2 26 12/22/2017 0424   GLUCOSE 113 (H) 12/22/2017 0424   BUN 13 12/22/2017 0424   CREATININE 0.69 12/22/2017 0424   CALCIUM 8.2 (L) 12/22/2017 0424   GFRNONAA >60 12/22/2017 0424   GFRAA >60 12/22/2017 0424   CBC    Component Value Date/Time   WBC 8.9 12/22/2017 0424   RBC 3.58 (L) 12/22/2017 0424   HGB 10.3 (L) 12/22/2017 0424   HCT 32.0 (L) 12/22/2017 0424   PLT 316 12/22/2017 0424   MCV 89.4 12/22/2017 0424   MCH 28.8 12/22/2017 0424   MCHC 32.2 12/22/2017 0424   RDW 14.9 12/22/2017 0424   LYMPHSABS 1.4 12/08/2017 0525   MONOABS 0.7 12/08/2017 0525   EOSABS 0.0 12/08/2017 0525   BASOSABS 0.0 12/08/2017 0525   CBG's (last 3):  No results for input(s): GLUCAP in the last 72 hours. LFT's Lab Results  Component Value Date   ALT 116 (H) 12/08/2017   AST 112 (H)  12/08/2017   ALKPHOS 140 (H) 12/08/2017   BILITOT 0.8 12/08/2017    Studies/Results: No results found.  Medications:  I have reviewed the patient's current medications. Scheduled Medications: . amLODipine  10 mg Oral QPC supper  . amoxicillin  250 mg Oral Q8H  . bisacodyl  10 mg Rectal Q0600  . capsaicin   Topical BID  . dextromethorphan-guaiFENesin  1 tablet Oral BID  . feeding supplement (PRO-STAT SUGAR FREE 64)  30 mL Oral BID  . gabapentin  400 mg Oral TID AC & HS  . loratadine  10 mg Oral Daily  . mouth rinse  15 mL Mouth Rinse BID  . megestrol  400 mg Oral Daily  . multivitamin  15 mL Oral Daily  . potassium chloride  20 mEq Oral Daily  . senna  1 tablet Oral QHS  . tamsulosin  0.4 mg Oral Daily   PRN Medications: acetaminophen **OR** acetaminophen, diazepam, guaiFENesin-dextromethorphan, metroNIDAZOLE, ondansetron **OR** ondansetron (ZOFRAN) IV, oxyCODONE, polyethylene glycol, sodium chloride, sorbitol, triamcinolone cream  Assessment/Plan: Active Problems:   Central cord syndrome (HCC)   DVT (deep vein thrombosis) in pregnancy   Leukocytosis   Essential hypertension   Hypokalemia  1.  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  Status post C5-6 and C6-7 ACDF on October 29.  Continue TLSO back brace when out of bed.  Continue inpatient intensive rehab  therapies including physical therapy and Occupational Therapy as ongoing.  Supportive care as ordered. 2.  Acute DVT right common and proximal femoral vein.  Status post IVC filter placed by radiology 3.  Pain management as ongoing with gabapentin, oxycodone and Valium for spasm as needed. 4. skin care.  Continue routine skin check with elevation of upper extremities when possible and focus on nutritional care. 5.  Neurogenic bowel and bladder.  Continue Flomax with in and out cath and bowel program as ongoing.  Skin care as noted above 6.  Essential hypertension.  Continue current medications.   Controlled at present. 7.  Acute blood loss anemia without signs of ongoing blood loss.  Hemoglobin stable and improving with nutritional status 8.  Acute E. coli urinary tract infection.  On amoxicillin for total of 7 days.  Afebrile  Length of stay, days: 17  Valerie A. Asa Lente, MD 12/24/2017, 11:32 AM

## 2017-12-24 NOTE — Plan of Care (Signed)
  Problem: Consults Goal: RH SPINAL CORD INJURY PATIENT EDUCATION Description  See Patient Education module for education specifics.  Outcome: Progressing   Problem: SCI BOWEL ELIMINATION Goal: RH STG MANAGE BOWEL WITH ASSISTANCE Description STG Manage Bowel with total Assistance.  Outcome: Progressing Goal: RH STG SCI MANAGE BOWEL PROGRAM W/ASSIST OR AS APPROPRIATE Description STG SCI Manage bowel program w/total assist or as appropriate.  Outcome: Progressing   Problem: SCI BLADDER ELIMINATION Goal: RH STG MANAGE BLADDER WITH MEDICATION WITH ASSISTANCE Description STG Manage Bladder With Medication With  Total Assistance.  Outcome: Progressing   Problem: RH SKIN INTEGRITY Goal: RH STG SKIN FREE OF INFECTION/BREAKDOWN Description With total assist  Outcome: Progressing Goal: RH STG MAINTAIN SKIN INTEGRITY WITH ASSISTANCE Description STG Maintain Skin Integrity With total Assistance.  Outcome: Progressing   Problem: RH SAFETY Goal: RH STG ADHERE TO SAFETY PRECAUTIONS W/ASSISTANCE/DEVICE Description STG Adhere to Safety Precautions With max Assistance/Device.  Outcome: Progressing   Problem: RH PAIN MANAGEMENT Goal: RH STG PAIN MANAGED AT OR BELOW PT'S PAIN GOAL Description At or below level 6  Outcome: Progressing   Problem: RH KNOWLEDGE DEFICIT SCI Goal: RH STG INCREASE KNOWLEDGE OF SELF CARE AFTER SCI Description Pt and family will be able to direct care post discharge using handout/resources independently  Outcome: Progressing

## 2017-12-24 NOTE — Progress Notes (Signed)
Occupational Therapy Session Note  Patient Details  Name: Sherry [name] MRN: [id number] Date of Birth: 11-Nov-1934  Today's Date: 12/24/2017 OT Individual Time: 5520-8022 OT Individual Time Calculation (min): 60 min    Short Term Goals: Week 2:  OT Short Term Goal 1 (Week 2): Pt will complete 1 grooming task with AE and Min A OT Short Term Goal 2 (Week 2): Pt will complete UB dressing with Max A  OT Short Term Goal 3 (Week 2): Pts dtr will demonstrate carryover of pressure relief education by assisting pt with position changes during daily routine   Skilled Therapeutic Interventions/Progress Updates:    Upon entering the room, pt supine in bed with no c/o pain and agreeable to OT intervention. Max A rolling L <> R to don pants with total A. Total A to don B TED hose and shoes as well. Supine >sit with +2 assistance for safety. Pt donning pull over shirt with max A. Beasy board utilized with second helper for safety to transfer into power wheelchair. Once in power chair, pt driving to sink and universal cuff placed on L UE with set up A to place toothbrush and pt brushing teeth with increased time. Pt able to back power chair into tight space next to bed with increased time and mod cuing for technique. Soft call bell within reach and pt tilted slightly back.   Therapy Documentation Precautions:  Precautions Precautions: Cervical, Back, Fall Precaution Comments: back precautions due to thoracic fx s/p ACDF; needs TLSO for all OOB activity but per order set does not need cervical collar  Required Braces or Orthoses: Spinal Brace Spinal Brace: Thoracolumbosacral orthotic, Applied in sitting position Restrictions Weight Bearing Restrictions: No Pain: Pain Assessment Pain Scale: 0-10 Pain Score: 8  Pain Type: Neuropathic pain Pain Location: Hand Pain Orientation: Left;Right Pain Descriptors / Indicators: Burning;Aching Pain Frequency: Constant Pain Onset: On-going Patients Stated  Pain Goal: 4 Pain Intervention(s): Medication (See eMAR)(oxycodone) Multiple Pain Sites: No ADL: ADL Equipment Provided: Other (comment)(dorsal wrist cuff) Eating: Supervision/safety Where Assessed-Eating: Wheelchair Grooming: Not assessed Where Assessed-Grooming: Bed level Upper Body Bathing: Moderate assistance Where Assessed-Upper Body Bathing: Bed level Lower Body Bathing: Other (comment), Maximal assistance(2 helpers) Where Assessed-Lower Body Bathing: Bed level Upper Body Dressing: Maximal assistance, Other (Comment)(2 helpers) Where Assessed-Upper Body Dressing: Bed level Lower Body Dressing: Maximal assistance, Other (Comment)(2 helpers) Where Assessed-Lower Body Dressing: Bed level Toileting: Not assessed Toilet Transfer: Not assessed Walk-In Shower Transfer: Not assessed   Therapy/Group: Individual Therapy    [name] 12/24/2017, 12:23 PM

## 2017-12-25 ENCOUNTER — Inpatient Hospital Stay (HOSPITAL_COMMUNITY): Payer: Medicare HMO

## 2017-12-25 MED ORDER — AMOXICILLIN 250 MG/5ML PO SUSR
250.0000 mg | Freq: Three times a day (TID) | ORAL | Status: AC
  Administered 2017-12-25 – 2017-12-26 (×4): 250 mg via ORAL
  Filled 2017-12-25 (×6): qty 5

## 2017-12-25 NOTE — Progress Notes (Signed)
Sherry [name] is a 83 y.o. female December 25, 1934 [id number]  Subjective: Patient reports feeling well today and optimistic about improving movement and control of fingers, especially left hand.  Patient expresses gratitude and future tense language about her recovery.  Objective: Vital signs in last 24 hours: Temp:  [98 F (36.7 C)-98.2 F (36.8 C)] 98.2 F (36.8 C) (11/24 0549) Pulse Rate:  [78-86] 78 (11/24 0549) Resp:  [16-18] 16 (11/24 0549) BP: (110-123)/(60-71) 110/60 (11/24 0549) SpO2:  [94 %-97 %] 94 % (11/24 0549) Weight change:  Last BM Date: 12/24/17  Intake/Output from previous day: 11/23 0701 - 11/24 0700 In: 240 [P.O.:240] Out: 3350 [Urine:3350]  Physical Exam General: No apparent distress   tetraplegia of upper extremities unchanged Lungs: Normal effort. Lungs clear to auscultation, no crackles or wheezes. Cardiovascular: Regular rate and rhythm, no edema Neurological: No new neurological deficits.  Tetraplegia without change  Lab Results: BMET    Component Value Date/Time   NA 140 12/22/2017 0424   K 3.7 12/22/2017 0424   CL 109 12/22/2017 0424   CO2 26 12/22/2017 0424   GLUCOSE 113 (H) 12/22/2017 0424   BUN 13 12/22/2017 0424   CREATININE 0.69 12/22/2017 0424   CALCIUM 8.2 (L) 12/22/2017 0424   GFRNONAA >60 12/22/2017 0424   GFRAA >60 12/22/2017 0424   CBC    Component Value Date/Time   WBC 8.9 12/22/2017 0424   RBC 3.58 (L) 12/22/2017 0424   HGB 10.3 (L) 12/22/2017 0424   HCT 32.0 (L) 12/22/2017 0424   PLT 316 12/22/2017 0424   MCV 89.4 12/22/2017 0424   MCH 28.8 12/22/2017 0424   MCHC 32.2 12/22/2017 0424   RDW 14.9 12/22/2017 0424   LYMPHSABS 1.4 12/08/2017 0525   MONOABS 0.7 12/08/2017 0525   EOSABS 0.0 12/08/2017 0525   BASOSABS 0.0 12/08/2017 0525   CBG's (last 3):  No results for input(s): GLUCAP in the last 72 hours. LFT's Lab Results  Component Value Date   ALT 116 (H) 12/08/2017   AST 112 (H) 12/08/2017   ALKPHOS 140 (H)  12/08/2017   BILITOT 0.8 12/08/2017    Studies/Results: No results found.  Medications:  I have reviewed the patient's current medications. Scheduled Medications: . amLODipine  10 mg Oral QPC supper  . amoxicillin  250 mg Oral Q8H  . bisacodyl  10 mg Rectal Q0600  . capsaicin   Topical BID  . dextromethorphan-guaiFENesin  1 tablet Oral BID  . feeding supplement (PRO-STAT SUGAR FREE 64)  30 mL Oral BID  . gabapentin  400 mg Oral TID AC & HS  . loratadine  10 mg Oral Daily  . mouth rinse  15 mL Mouth Rinse BID  . megestrol  400 mg Oral Daily  . multivitamin  15 mL Oral Daily  . potassium chloride  20 mEq Oral Daily  . senna  1 tablet Oral QHS  . tamsulosin  0.4 mg Oral Daily   PRN Medications: acetaminophen **OR** acetaminophen, diazepam, guaiFENesin-dextromethorphan, metroNIDAZOLE, ondansetron **OR** ondansetron (ZOFRAN) IV, oxyCODONE, polyethylene glycol, sodium chloride, sorbitol, triamcinolone cream  Assessment/Plan: Active Problems:   Central cord syndrome (HCC)   DVT (deep vein thrombosis) in pregnancy   Leukocytosis   Essential hypertension   Hypokalemia  1.  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  Status post C5-6 and C6-7 ACDF on October 29.  Continue TLSO back brace when out of bed.  Continue inpatient intensive rehab therapies including physical therapy and Occupational Therapy as ongoing.  Supportive care as ordered. 2.  Acute DVT right common and proximal femoral vein.  Status post IVC filter placed by interventional radiology 3.  Pain management as ongoing with gabapentin, oxycodone and Valium for spasm as needed. 4. skin care.  Continue routine skin check with elevation of upper extremities when possible and focus on nutritional care. 5.  Neurogenic bowel and bladder.  Continue Flomax with in and out cath and bowel program as ongoing.  Skin care as noted above 6.  Essential hypertension.  Continue current medications.  Controlled at  present. 7.  Acute blood loss anemia without signs of ongoing blood loss.  Hemoglobin stable and improving with nutritional status 8.  Acute E. coli urinary tract infection.  On amoxicillin for total of 7 days.  Afebrile  Length of stay, days: 18  [name] A. Asa Lente, MD 12/25/2017, 10:05 AM

## 2017-12-25 NOTE — Plan of Care (Signed)
  Problem: Consults Goal: RH SPINAL CORD INJURY PATIENT EDUCATION Description  See Patient Education module for education specifics.  Outcome: Progressing   Problem: SCI BOWEL ELIMINATION Goal: RH STG MANAGE BOWEL WITH ASSISTANCE Description STG Manage Bowel with total Assistance.  Outcome: Progressing Goal: RH STG SCI MANAGE BOWEL PROGRAM W/ASSIST OR AS APPROPRIATE Description STG SCI Manage bowel program w/total assist or as appropriate.  Outcome: Progressing   Problem: SCI BLADDER ELIMINATION Goal: RH STG MANAGE BLADDER WITH MEDICATION WITH ASSISTANCE Description STG Manage Bladder With Medication With  Total Assistance.  Outcome: Progressing   Problem: RH SKIN INTEGRITY Goal: RH STG SKIN FREE OF INFECTION/BREAKDOWN Description With total assist  Outcome: Progressing Goal: RH STG MAINTAIN SKIN INTEGRITY WITH ASSISTANCE Description STG Maintain Skin Integrity With total Assistance.  Outcome: Progressing   Problem: RH SAFETY Goal: RH STG ADHERE TO SAFETY PRECAUTIONS W/ASSISTANCE/DEVICE Description STG Adhere to Safety Precautions With max Assistance/Device.  Outcome: Progressing   Problem: RH PAIN MANAGEMENT Goal: RH STG PAIN MANAGED AT OR BELOW PT'S PAIN GOAL Description At or below level 6  Outcome: Progressing   Problem: RH KNOWLEDGE DEFICIT SCI Goal: RH STG INCREASE KNOWLEDGE OF SELF CARE AFTER SCI Description Pt and family will be able to direct care post discharge using handout/resources independently  Outcome: Progressing

## 2017-12-25 NOTE — Progress Notes (Signed)
Went in to perform bowel program & patient had a small amount of stool around her anal area. Patient was digitally stimulated & suppository advanced as ordered. The stool in her rectum was mushy with no pieces that could be manually removed. When re-checked, she had not had a bowel movement. She was digitally stimulated again & no results were noted. Will pass on to on-coming nurse.

## 2017-12-26 ENCOUNTER — Inpatient Hospital Stay (HOSPITAL_COMMUNITY): Payer: Medicare HMO | Admitting: Speech Pathology

## 2017-12-26 ENCOUNTER — Inpatient Hospital Stay (HOSPITAL_COMMUNITY): Payer: Medicare HMO | Admitting: Physical Therapy

## 2017-12-26 ENCOUNTER — Inpatient Hospital Stay (HOSPITAL_COMMUNITY): Payer: Medicare HMO | Admitting: Occupational Therapy

## 2017-12-26 ENCOUNTER — Inpatient Hospital Stay (HOSPITAL_COMMUNITY): Payer: Medicare HMO

## 2017-12-26 NOTE — Progress Notes (Signed)
Physical Therapy Session Note  Patient Details  Name: Sherry [name] MRN: [id number] Date of Birth: 10/30/1934  Today's Date: 12/26/2017 PT Individual Time: 1505-1550 PT Individual Time Calculation (min): 45 min   Short Term Goals: Week 3:  PT Short Term Goal 1 (Week 3): Pt will utilize hooking to assist with maintaining side lying in bed in 75% of observations without cues.  PT Short Term Goal 2 (Week 3): Pt will initiate static standing with lift equipment x2 minutes PT Short Term Goal 3 (Week 3): Pt will maintain static sitting balance with min assist in 50% of observations  Skilled Therapeutic Interventions/Progress Updates: Pt received in w/c, c/o pain as below pre-medicated and agreeable to treatment. S power w/c mobility to/from gym; pt with improving technique steering w/c including backing into space beside bed and avoiding obstacles. Lateral scoot transfer to/from mat table with beasy board with maxA +1 with slight downhill setup to facilitate scooting and pt initiation. On second trial pt demos LLE hip/knee activation to assist with scoot however minimal core activation. Performed sit <>stand from mat table x5 trials with +2 three muskateers assist; utilized bolster in between knees to maintain alignment d/t significant premorbid genu valgus. In sit>stand, pt demos L quad/glute activation>R, but trace activation in R as well. Utilized Geologist, engineering for visual feedback for upright trunk and hip extension. Semi-reclined sit ups x7 reps from stability ball with min guard and increased time for focus on core activation. Returned to w/c as above; remained in w/c at end of session, seat belt intact, family present and all needs in reach.      Therapy Documentation Precautions:  Precautions Precautions: Cervical, Back, Fall Precaution Comments: back precautions due to thoracic fx s/p ACDF; needs TLSO for all OOB activity but per order set does not need cervical collar  Required Braces or  Orthoses: Spinal Brace Spinal Brace: Thoracolumbosacral orthotic, Applied in sitting position Restrictions Weight Bearing Restrictions: No Pain: Pain Assessment Pain Scale: 0-10 Pain Score: 4  Pain Location: Arm Pain Orientation: Right;Left Patients Stated Pain Goal: 2 Pain Intervention(s): Medication (See eMAR);Repositioned   Therapy/Group: Individual Therapy  Corliss Skains 12/26/2017, 3:47 PM

## 2017-12-26 NOTE — Progress Notes (Signed)
[facility] PHYSICAL MEDICINE & REHABILITATION PROGRESS NOTE   Subjective/Complaints: Moving fingers on Left hand !  ROS: Patient denies fever, rash, sore throat, blurred vision, nausea, vomiting, diarrhea, cough, shortness of breath or chest pain,  headache, or mood change.     Objective:   No results found. No results for input(s): WBC, HGB, HCT, PLT in the last 72 hours. No results for input(s): NA, K, CL, CO2, GLUCOSE, BUN, CREATININE, CALCIUM in the last 72 hours.  Intake/Output Summary (Last 24 hours) at 12/26/2017 0814 Last data filed at 12/26/2017 0256 Gross per 24 hour  Intake 540 ml  Output 3950 ml  Net -3410 ml     Physical Exam: Vital Signs Blood pressure 110/64, pulse 69, temperature 99.2 F (37.3 C), temperature source Oral, resp. rate 18, height 5\' 7"  (1.702 m), weight 79 kg, SpO2 92 %. Constitutional: No distress . Vital signs reviewed. HEENT: EOMI, oral membranes moist Neck: supple Cardiovascular: RRR without murmur. No JVD    Respiratory: CTA Bilaterally without wheezes or rales. Normal effort    GI: BS +, non-tender, non-distended   Musculoskeletal: Mild edema distally in LE's and UE.  Neurological: Alert and oriented. Sensation diminished to light touch below T4, sl sense of pain Motor: RUE: Shoulder abduction, elbow flex 3+/5, elbow extension, wrist extension 1+ to 2-/5, hand gip 0/5--trace  Finger flexion in all 4 digits on left and index finger on R LUE: Shoulder abduction, elbow flexion 3-/5, wrist extension 2/5, hand gip 0/5--stable R LE: 1+/5 proximal to distal, LLE 2/5 prox to 1+ to 2-/5 distally-stable exam.  -no resting tone Skin: Incision c/d/i, . Psychiatric:pleasant  Assessment/Plan: 1. 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  Physiatrist is providing close team supervision and 24 hour management of active  medical problems listed below.  Physiatrist and rehab team continue to assess barriers to discharge/monitor patient progress toward functional and medical goals  Care Tool:  Bathing  Bathing activity did not occur: Refused Body parts bathed by patient: Right arm, Left arm, Chest, Left upper leg, Right upper leg, Abdomen   Body parts bathed by helper: Front perineal area, Buttocks, Right lower leg, Left lower leg, Face Body parts n/a: Face, Front perineal area, Buttocks(previously washed )   Bathing assist Assist Level: 2 Helpers     Upper Body Dressing/Undressing Upper body dressing   What is the patient wearing?: Pull over shirt    Upper body assist Assist Level: Maximal Assistance - Patient 25 - 49%    Lower Body Dressing/Undressing Lower body dressing      What is the patient wearing?: Pants     Lower body assist Assist for lower body dressing: Dependent - Patient 0%     Toileting Toileting    Toileting assist Assist for toileting: Dependent - Patient 0%     Transfers Chair/bed transfer  Transfers assist     Chair/bed transfer assist level: 2 Helpers     Locomotion Ambulation   Ambulation assist   Ambulation activity did not occur: Safety/medical concerns          Walk 10 feet activity   Assist  Walk 10 feet activity did not occur: Safety/medical concerns        Walk 50 feet activity   Assist Walk 50 feet with 2 turns activity did not occur: Safety/medical concerns         Walk 150 feet activity  Assist Walk 150 feet activity did not occur: Safety/medical concerns         Walk 10 feet on uneven surface  activity   Assist Walk 10 feet on uneven surfaces activity did not occur: Safety/medical concerns         Wheelchair     Assist Will patient use wheelchair at discharge?: Yes Type of Wheelchair: Power Wheelchair activity did not occur: Safety/medical concerns  Wheelchair assist level: Supervision/Verbal  cueing Max wheelchair distance: 800    Wheelchair 50 feet with 2 turns activity    Assist    Wheelchair 50 feet with 2 turns activity did not occur: Safety/medical concerns   Assist Level: Supervision/Verbal cueing   Wheelchair 150 feet activity     Assist Wheelchair 150 feet activity did not occur: Safety/medical concerns   Assist Level: Supervision/Verbal cueing     Medical Problem List and Plan: 1.  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  Status post C5-6 and C6-7 anterior cervical discectomy and fusion 11/29/2017.  TLSO back brace when out of bed Some improvement with strength I finger flexors  -Continue CIR therapies including PT, OT   -reviewed plan for bowels and bladder with pt/daughter.      2.  DVT Prophylaxis/Anticoagulation:  Acute mobile DVT Right common/proximal femoral vein  -IVCF placed by radiology  3. Pain Management:   Neurontin  400mg  qid with improvement and not oversedated    Valium 5 mg every 6 hours as needed muscle spasms  oxycodone as needed for pain 4. Mood:   -ongoing ego support, positive reinforcement  -neuropsych   5. Neuropsych: This patient is capable of making decisions on her own behalf. 6. Skin/Wound Care: Routine skin checks  - elevate limbs when possible  -sutures removed from left elbow  -better nutrition will also help edema mgt 7. Fluids/Electrolytes/Nutrition: encourage PO  -replacing potassium  - protein for hypoalbuminemia  -continue QD megace, eating better 75-100%     8.  Neurogenic bowel and bladder.  Flomax 0.4 mg daily.    -I/O caths    -AM bowel program, senna-s + HS, AM suppository.      -  senna-s to just senna at bedtime   -bowel movements more on schedule in AM, less incontinence 9.  Hypertension.  Norvasc 10 mg daily.   Vitals:   12/25/17 2020 12/26/17 0555  BP: 121/68 110/64  Pulse: 90 69  Resp: 19 18  Temp: 98.6 F (37 C) 99.2 F (37.3 C)  SpO2:  100% 92%   Controlled on 11/25 10. Acute blood loss anemia:     -hgb 10.9  11/11-->10.2 11/18--> 10.3 11/21   -no signs of blood loss  -improving nutritional status 11.  Leukocytosis-resolved  WBCs 11.7 11/18--->down to 8.9 11/21  Urine with 100k E coli---amoxil until tomorrow   LOS: 19 days A FACE TO [place] E Kirsteins 12/26/2017, 8:14 AM

## 2017-12-26 NOTE — Progress Notes (Signed)
Occupational Therapy Session Note  Patient Details  Name: Sherry [name] MRN: [id number] Date of Birth: 02/25/1934  Today's Date: 12/26/2017 OT Individual Time: 1310-1420 OT Individual Time Calculation (min): 70 min   Skilled Therapeutic Interventions/Progress Updates:    Pt greeted in bed after cath. Ready for tx. Worked on trunk and UE strengthening via long sitting in bed. Pt able to form weak grasp on bedrails with 2 assist for supine<long sit. Pt able to maintain seated position with Mod A and vcs for anterior weight shift. Used dycem for increasing ease of B grasp. Pt reporting she felt a good stretch in LEs at this time. Able to maintain position for 3-4 minute windows prior to fatiguing and needing reclined rest. Listened to meaningful music to enhance volition and affect. Afterwards, continued working on trunk strengthening EOB, with pt laterally leaning onto elbows. Able to consistently push herself back to midline with steady assist! Steady assist-close supervision for static sitting balance with B UE support for 2 minutes at most. Beazy board transfer <PWC with 2 assist, and 2 assist for repositioning. With min-mod vcs, pt reclined herself in [facility] and maneuvered towards bedside. Pt left with all needs within reach at session exit.   Therapy Documentation Precautions:  Precautions Precautions: Cervical, Back, Fall Precaution Comments: back precautions due to thoracic fx s/p ACDF; needs TLSO for all OOB activity but per order set does not need cervical collar  Required Braces or Orthoses: Spinal Brace Spinal Brace: Thoracolumbosacral orthotic, Applied in sitting position Restrictions Weight Bearing Restrictions: No Vital Signs: Therapy Vitals Temp: 98.2 F (36.8 C) Pulse Rate: 88 Resp: 18 BP: 136/69 Patient Position (if appropriate): Sitting Oxygen Therapy SpO2: 95 % O2 Device: Room Air Pain: In back and hands. RN in to provide medicine during session  Pain  Assessment Pain Scale: 0-10 Pain Score: 4  Pain Location: Arm Pain Orientation: Right;Left Patients Stated Pain Goal: 6 Pain Intervention(s): Medication (See eMAR);Repositioned ADL: ADL Equipment Provided: Other (comment)(dorsal wrist cuff) Eating: Supervision/safety Where Assessed-Eating: Wheelchair Grooming: Not assessed Where Assessed-Grooming: Bed level Upper Body Bathing: Moderate assistance Where Assessed-Upper Body Bathing: Bed level Lower Body Bathing: Other (comment), Maximal assistance(2 helpers) Where Assessed-Lower Body Bathing: Bed level Upper Body Dressing: Maximal assistance, Other (Comment)(2 helpers) Where Assessed-Upper Body Dressing: Bed level Lower Body Dressing: Maximal assistance, Other (Comment)(2 helpers) Where Assessed-Lower Body Dressing: Bed level Toileting: Not assessed Toilet Transfer: Not assessed Walk-In Shower Transfer: Not assessed      Therapy/Group: Individual Therapy  Sherry [name] 12/26/2017, 4:05 PM

## 2017-12-26 NOTE — Progress Notes (Signed)
Physical Therapy Session Note  Patient Details  Name: Sherry [name] MRN: [id number] Date of Birth: 08/12/1934  Today's Date: 12/26/2017 PT Individual Time: 1015-1100 PT Individual Time Calculation (min): 45 min   Short Term Goals: Week 3:  PT Short Term Goal 1 (Week 3): Pt will utilize hooking to assist with maintaining side lying in bed in 75% of observations without cues.  PT Short Term Goal 2 (Week 3): Pt will initiate static standing with lift equipment x2 minutes PT Short Term Goal 3 (Week 3): Pt will maintain static sitting balance with min assist in 50% of observations  Skilled Therapeutic Interventions/Progress Updates:    Pt seated in power w/c upon PT arrival, agreeable to therapy tx and denies pain. Pt reports she thinks she either already had a BM or needs to have BM. Therapist assisted pt to control power w/c to set up for transfer to bed. Pt performed beasy board transfer with total assist +2, sit>supine with max assist +2 and supine>sidelying with max assist. Pt incontinent of bowel, dependent for clothing management and peri care this session. Pt transferred back to sitting EOB with max assist +2. Pt able to maintain sitting balance with min assist while w/c set up for transfer. Pt performed beasy board transfer back to w/c with total assist +2. Pt worked on Futures trader for the remainder of session, supervision when going forwards, min assist with max verbal cueing when backing up. Pt left seated in w/c at end of session with needs in reach.   Therapy Documentation Precautions:  Precautions Precautions: Cervical, Back, Fall Precaution Comments: back precautions due to thoracic fx s/p ACDF; needs TLSO for all OOB activity but per order set does not need cervical collar  Required Braces or Orthoses: Spinal Brace Spinal Brace: Thoracolumbosacral orthotic, Applied in sitting position Restrictions Weight Bearing Restrictions: No    Therapy/Group: Individual  Therapy  Netta Corrigan, PT, DPT 12/26/2017, 7:51 AM

## 2017-12-26 NOTE — Progress Notes (Signed)
Occupational Therapy Weekly Progress Note  Patient Details  Name: Sherry [name] MRN: [id number] Date of Birth: 08/21/1934  Beginning of progress report period: December 16, 2017 End of progress report period: December 26, 2017  Today's Date: 12/26/2017 OT Individual Time: 5003-7048 OT Individual Time Calculation (min): 77 min    Patient has met 3 of 3 short term goals. Pt making slow progress towards LTGs. Pt continues to require +2 assist for all functional transfers but making small improvements on trunk control. Pt is making improvements with use of AE such as universal cuff for self feeding and grooming tasks. Pt is reporting less pain with movement during ADL sessions.    Patient continues to demonstrate the following deficits: muscle weakness, decreased cardiorespiratoy endurance and decreased sitting balance and decreased postural control, weakness, safety awareness,  and therefore will continue to benefit from skilled OT intervention to enhance overall performance with Reduce care partner burden.  Patient progressing toward long term goals..  Continue plan of care.  OT Short Term Goals Week 2:  OT Short Term Goal 1 (Week 2): Pt will complete 1 grooming task with AE and Min A OT Short Term Goal 1 - Progress (Week 2): Met OT Short Term Goal 2 (Week 2): Pt will complete UB dressing with Max A  OT Short Term Goal 2 - Progress (Week 2): Met OT Short Term Goal 3 (Week 2): Pts dtr will demonstrate carryover of pressure relief education by assisting pt with position changes during daily routine  OT Short Term Goal 3 - Progress (Week 2): Met Week 3:  OT Short Term Goal 1 (Week 3): Pt will perform static sitting balance on EOB for 5 minutes with max A or less. OT Short Term Goal 2 (Week 3): Pt will perform UB dressing with mod A.  Skilled Therapeutic Interventions/Progress Updates:    Upon entering the room, pt supine in bed with daughter exiting the room. Pt with no c/o pain this  session and agreeable to OT intervention. Pt rolling L <> R with max A as she was incontinent of BM and required total A for hygiene and LB clothing management. Pt performing supine >sit with total A +2 and seated on EOB with total progressing to max A at moments for sitting balance. Pt donning pull over shirt while seated on EOB with max A and transferring into power wheelchair with beasy board and +2 assist. Pt utilized power chair to sink with set up A and use of universal cuff for grooming tasks. Pt remained in power chair with RN and NT present in room. Call bell and all needed items within reach.   Therapy Documentation Precautions:  Precautions Precautions: Cervical, Back, Fall Precaution Comments: back precautions due to thoracic fx s/p ACDF; needs TLSO for all OOB activity but per order set does not need cervical collar  Required Braces or Orthoses: Spinal Brace Spinal Brace: Thoracolumbosacral orthotic, Applied in sitting position Restrictions Weight Bearing Restrictions: No ADL: ADL Equipment Provided: Other (comment)(dorsal wrist cuff) Eating: Supervision/safety Where Assessed-Eating: Wheelchair Grooming: Not assessed Where Assessed-Grooming: Bed level Upper Body Bathing: Moderate assistance Where Assessed-Upper Body Bathing: Bed level Lower Body Bathing: Other (comment), Maximal assistance(2 helpers) Where Assessed-Lower Body Bathing: Bed level Upper Body Dressing: Maximal assistance, Other (Comment)(2 helpers) Where Assessed-Upper Body Dressing: Bed level Lower Body Dressing: Maximal assistance, Other (Comment)(2 helpers) Where Assessed-Lower Body Dressing: Bed level Toileting: Not assessed Toilet Transfer: Not assessed Walk-In Shower Transfer: Not assessed   Therapy/Group: Individual Therapy  Darleen Crocker P 12/26/2017, 10:00 AM

## 2017-12-27 ENCOUNTER — Inpatient Hospital Stay (HOSPITAL_COMMUNITY): Payer: Medicare HMO | Admitting: Physical Therapy

## 2017-12-27 ENCOUNTER — Encounter (HOSPITAL_COMMUNITY): Payer: Medicare HMO | Admitting: Psychology

## 2017-12-27 ENCOUNTER — Inpatient Hospital Stay (HOSPITAL_COMMUNITY): Payer: Medicare HMO | Admitting: Occupational Therapy

## 2017-12-27 DIAGNOSIS — F329 Major depressive disorder, single episode, unspecified: Secondary | ICD-10-CM

## 2017-12-27 MED ORDER — GERHARDT'S BUTT CREAM
TOPICAL_CREAM | Freq: Four times a day (QID) | CUTANEOUS | Status: DC
  Administered 2017-12-27 – 2018-01-18 (×79): via TOPICAL
  Filled 2017-12-27 (×2): qty 1

## 2017-12-27 MED ORDER — CALCIUM POLYCARBOPHIL 625 MG PO TABS
625.0000 mg | ORAL_TABLET | Freq: Every day | ORAL | Status: DC
  Administered 2017-12-27 – 2018-01-19 (×24): 625 mg via ORAL
  Filled 2017-12-27 (×25): qty 1

## 2017-12-27 NOTE — Progress Notes (Signed)
Physical Therapy Session Note  Patient Details  Name: Sherry [name] MRN: [id number] Date of Birth: 04-21-1934  Today's Date: 12/27/2017 PT Individual Time: 1303-1400 PT Individual Time Calculation (min): 57 min   Short Term Goals: Week 3:  PT Short Term Goal 1 (Week 3): Pt will utilize hooking to assist with maintaining side lying in bed in 75% of observations without cues.  PT Short Term Goal 2 (Week 3): Pt will initiate static standing with lift equipment x2 minutes PT Short Term Goal 3 (Week 3): Pt will maintain static sitting balance with min assist in 50% of observations  Skilled Therapeutic Interventions/Progress Updates:     Patient received up in [facility], pleasant and willing to participate in skilled session today; she reports she is proud of progressing to more standing activities but is moderately fatigued this afternoon. Continued working on navigation of power WC with occasional cues for forward propulsion and moderate cues for safety and sequencing when putting chair in reverse. Worked on strengthening of B UEs and LEs in [place], then returned to standing frame with patient able to maintain standing in upright in frame without rest breaks for approximately 10 minutes. She was then returned to her room in power WC and transferred back to bed with beesy board with [place], sit to supine with MaxAx2 due to fatigue. She was left in bed with all needs met, bed alarm active and RN aware of patient request to be checked for possible cath this afternoon.   Therapy Documentation Precautions:  Precautions Precautions: Cervical, Back, Fall Precaution Comments: back precautions due to thoracic fx s/p ACDF; needs TLSO for all OOB activity but per order set does not need cervical collar  Required Braces or Orthoses: Spinal Brace Spinal Brace: Thoracolumbosacral orthotic, Applied in sitting position Restrictions Weight Bearing Restrictions: No General:   Pain: Pain Assessment Pain Scale:  0-10 Pain Score: 3  Faces Pain Scale: Hurts little more Pain Type: Acute pain Pain Location: Hand Pain Orientation: Right;Left Pain Descriptors / Indicators: Aching Pain Onset: On-going Patients Stated Pain Goal: 0 Pain Intervention(s): Ambulation/increased activity;Repositioned Multiple Pain Sites: No    Therapy/Group: Individual Therapy   Deniece [name] PT, DPT, CBIS  Supplemental Physical Therapist [facility]    Pager [phone] Acute Rehab Office [phone]

## 2017-12-27 NOTE — Patient Care Conference (Addendum)
Inpatient RehabilitationTeam Conference and Plan of Care Update Date: 12/27/2017   Time: 10:00 AM    Patient Name: Sherry [name]      Medical Record Number: [id number]  Date of Birth: February 17, 1934 Sex: Female         Room/Bed: 4W13C/4W13C-01 Payor Info: Payor: [employer] MEDICARE / Plan: [employer] MEDICARE HMO/PPO / Product Type: *No Product type* /    Admitting Diagnosis: Fall with SCI   central cord syndrome  Admit Date/Time:  12/07/2017  5:47 PM Admission Comments: No comment available   Primary Diagnosis:  <principal problem not specified> Principal Problem: <principal problem not specified>  Patient Active Problem List   Diagnosis Date Noted  . Reactive depression   . Hypokalemia   . DVT (deep vein thrombosis) in pregnancy   . Leukocytosis   . Essential hypertension   . Trauma   . 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([place])   . Neuropathic pain   . Neurogenic bowel   . Neurogenic bladder   . Benign essential HTN   . Acute blood loss anemia   . Central cord syndrome at C6 level of cervical spinal cord ([place]) 11/29/2017  . Central cord syndrome [facility]) 11/29/2017    Expected Discharge Date: Expected Discharge Date: 01/11/18  Team Members Present: Physician leading conference: Dr. Delice Lesch Social Worker Present: Lennart Pall, LCSW Nurse Present: Other (comment)(Victoria Totten, RN) PT Present: Kem Parkinson, PT OT Present: Benay Pillow, OT PPS Coordinator present : Gunnar Fusi, SLP)     Current Status/Progress Goal Weekly Team Focus  Medical   stools loose, completed tx for UTI  maximize trunk strength , achieve cont with bowel adn bladder program  see above   Bowel/Bladder   incontient, in/out cath, bowel program   family education   accurate timing of b/b emptying    Swallow/Nutrition/ Hydration             ADL's   total A +2 for LB ADLs and slide board transfers, UB self care with max A, grooming with min A, feeding self with u cuff and set up A  mod A  adaptive self care skills,  balance, functional transfers, NMR, pt/family education   Mobility   maxA +2 bed mobility, transfers, standby/minA sitting balance, maxA +2 sit <>stand  mod bed and transfers, max car transfer, supervision w/c (likely power)  sitting balance, transfer training, LE NMR   Communication             Safety/Cognition/ Behavioral Observations            Pain   controlled on current medication          Skin   right foot dried blister, complaints of irritation to sacrum area,   prevention of skin breakdown   keep skin dry       *See Care Plan and progress notes for long and short-term goals.     Barriers to Discharge  Current Status/Progress Possible Resolutions Date Resolved   Physician    Medical stability;Neurogenic Bowel & Bladder     progress toward goals  COnt Medical rehabilitation managment      Nursing                  PT                    OT                  SLP  SW                Discharge Planning/Teaching Needs:  Pt to d/c home with family to provide 24/7 assistance.  Daughter already beginning home modifications. VS change of plan to SNF - still under discussion.  Teaching ongoing.  Daughter here frequently.   Team Discussion:  Still with loose stool but may improve with stop of abx.  incont b/b and need to continue to work on b/b program to prevent skin breakdown.  MD to d/c megace as appetite much better.  Pt continues to make good improvements!  Improved sitting balance and sitting EOB.  Now max assist 1-2 persons with bed mobility and transfers.  Team feels strongly that pt is making steady progress and still recommending target d/c date 12/11  Revisions to Treatment Plan:  NA    Continued Need for Acute Rehabilitation Level of Care: The patient requires daily medical management by a physician with specialized training in physical medicine and rehabilitation for the following conditions: Daily medical management of patient stability for increased  activity during participation in an intensive rehabilitation regime.: Yes Daily analysis of laboratory values and/or radiology reports with any subsequent need for medication adjustment of medical intervention for : Urological problems;Neurological problems   I attest that I was present, lead the team conference, and concur with the assessment and plan of the team.   Renesmay Nesbitt 12/27/2017, 4:34 PM

## 2017-12-27 NOTE — Consult Note (Signed)
Neuropsychological Consultation   Patient:   Sherry [name]   DOB:   1934/08/11  MR Number:  [id number]  Location:  [place] [address] [place] [id number] [place] [address] Dept: [place]: [phone]           Date of Service:   12/27/2017  Start Time:   2 PM End Time:   3 PM  Provider/Observer:  Ilean Skill, Psy.D.       Clinical Neuropsychologist       Billing Code/Service: [phone] 4 Units  Chief Complaint:    HAYES REHFELDT is an 83 year old female with a history of hypertension.  The patient presented on 11/29/2017 after a fall with immediate quadriparesis.  Cranial CT scans reviewed and were unremarkable for acute intracranial process.  CT/MRI imaging showed C6-7 disc fracture and tear of anterior longitudinal ligament.  Paraspinal muscle and interspinous edema from C1-C7 compatible with ligament injury and muscle strain.  Increased cord signal at C5 and C6.  No cord hemorrhage.  T11 coronal vertebral body fracture was noted.  The patient underwent C5-6, C6-7 anterior cervical discectomy and fusion on 11/29/2017.  The patient has had continued severe motor deficits.  The patient has begun to regain some motor movement in her arms but has significant deficits in her hands.  She has some increasing motor movement for her left leg but [name] movement in her right leg and foot.  The patient is having significant adjustment difficulties and is feeling very anxious, depressed and tearful with the realization of the degree of loss of functioning she is having to cope with.  12/27/2017:  The patient is starting to have more motor movement in her arm/hands (still significant impairments) and increasing movement in her left leg/foot.  She is positive about these improvements and remains motivated with progress.  Reason for Service:  The patient was referred for neuropsychological consultation due to  coping and adjustment issues following a cervical central cord injury with resulting severe motor deficits.  Below is the HPI for the current admission.  HPI: Sherry [name] is an 83 year old right-handed female with history of hypertension.  Per chart review, family, and patient, lives with spouse.  Independent prior to admission.  Multilevel home with bed and bath on main level.  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  She has multiple family in the area.  Presented 11/29/2017 after a fall with immediate quadriparesis.  Cranial CT scan reviewed, unremarkable for acute intracranial process. CT/MRI imaging showed C6-7 disc fracture and tear of anterior longitudinal ligament.  Paraspinal muscle and interspinous edema from C1-C7 compatible ligament injury and muscle strain.  Increased cord signal at C5 and C6.  No cord hemorrhage and also noted T11 coronal vertebral body fracture.  Underwent C5-6, C6-7 anterior cervical discectomy and fusion 11/29/2017 per Dr. Venetia Constable.  TLSO back brace when out of bed applied in sitting position.  Hospital course pain management.  Subcutaneous heparin for DVT prophylaxis.  Therapy evaluations completed with recommendations of physical medicine rehab consult.  Patient was admitted for a comprehensive rehab program.  Current Status:  The patient reports that her depressive reaction has improved a great deal as she is seeing more movement in hands and left foot.  Improvement in hands was one of her biggest concerns when I saw her last week.  The patient reports that her mood has improved.  She has been sleeping with  TV on and I suggested she stop that if she can.  Behavioral Observation: [name]  presents as a 83 y.o.-year-old Right Caucasian Female who appeared her stated age. her dress was Appropriate and she was Well Groomed and her manners were Appropriate to the situation.  her participation was indicative of Appropriate and Attentive  behaviors.  There were any physical disabilities noted.  she displayed an appropriate level of cooperation and motivation.     Interactions:    Active Appropriate and Attentive  Attention:   within normal limits and attention span and concentration were age appropriate  Memory:   within normal limits; recent and remote memory intact  Visuo-spatial:  not examined  Speech (Volume):  low  Speech:   normal; normal  Thought Process:  Coherent and Relevant  Though Content:  WNL; not suicidal and not homicidal  Orientation:   person, place, time/date and situation  Judgment:   Good  Planning:   Good  Affect:    Appropriate  Mood:    Euthymic  Insight:   Good  Intelligence:   high  Medical History:   Past Medical History:  Diagnosis Date  . Arthritis   . Cancer ([place])    skin  . Hypertension         Abuse/Trauma History: The patient recently had a traumatic fall where she suffered cervical central cord injury resulting in severe motor deficits from her neck down.  Psychiatric History:  The patient denies any prior psychiatric history.  Family Med/Psych History:  Family History  Problem Relation Age of Onset  . Breast cancer Maternal Aunt 75    Risk of Suicide/Violence: low  Patient reports that mood is significantly improved.  Impression/DX:  Sherry [name] is an 83 year old female with a history of hypertension.  The patient presented on 11/29/2017 after a fall with immediate quadriparesis.  Cranial CT scans reviewed and were unremarkable for acute intracranial process.  CT/MRI imaging showed C6-7 disc fracture and tear of anterior longitudinal ligament.  Paraspinal muscle and interspinous edema from C1-C7 compatible with ligament injury and muscle strain.  Increased cord signal at C5 and C6.  No cord hemorrhage.  T11 coronal vertebral body fracture was noted.  The patient underwent C5-6, C6-7 anterior cervical discectomy and fusion on 11/29/2017.  The patient has  had continued severe motor deficits.  The patient has begun to regain some motor movement in her arms but has significant deficits in her hands.  She has some increasing motor movement for her left leg but [name] movement in her right leg and foot.  The patient is having significant adjustment difficulties and is feeling very anxious, depressed and tearful with the realization of the degree of loss of functioning she is having to cope with.  The patient reports that her depressive reaction has improved a great deal as she is seeing more movement in hands and left foot.  Improvement in hands was one of her biggest concerns when I saw her last week.  The patient reports that her mood has improved.  She has been sleeping with TV on and I suggested she stop that if she can.   Disposition/Plan:  I will follow-up with the patient first of next week to continue to assess adjustment and coping.           Electronically Signed   _______________________ Ilean Skill, Psy.D.

## 2017-12-27 NOTE — Progress Notes (Signed)
Occupational Therapy Session Note  Patient Details  Name: Sherry [name] MRN: [id number] Date of Birth: [birth year]  Today's Date: 12/27/2017 OT Individual Time: 1157-2620 OT Individual Time Calculation (min): 49 min    Short Term Goals: Week 3:  OT Short Term Goal 1 (Week 3): Pt will perform static sitting balance on EOB for 5 minutes with max A or less. OT Short Term Goal 2 (Week 3): Pt will perform UB dressing with mod A.  Skilled Therapeutic Interventions/Progress Updates:    Upon entering the room, pt supine in bed with family present in room. Pt agreeable to OT intervention. Pt engaged in modified sit up with use of HOB elevated to 30 degrees and pt able to grab onto bed rail with B UEs and pull UB up and then place elbows on LEs into long sitting position with steady assistance. Pt able to hold position for 1-2 minutes x 2 reps. Supine >sit with total A to EOB. Pt leaning onto R elbow for placement of beasy board and pushing self back into midline with steady assistance and min verbal cuing for technique. +2 assist into wheelchair secondary to fatigue and need for repositioning. Pt driving power wheelchair at 6 mph around obstacle course and backing up into tight space with min verbal cuing for technique. Pt returning to room and tilting self back with manipulation of functions. All needs within reach. Family remains present in room.   Therapy Documentation Precautions:  Precautions Precautions: Cervical, Back, Fall Precaution Comments: back precautions due to thoracic fx s/p ACDF; needs TLSO for all OOB activity but per order set does not need cervical collar  Required Braces or Orthoses: Spinal Brace Spinal Brace: Thoracolumbosacral orthotic, Applied in sitting position Restrictions Weight Bearing Restrictions: No General:   Vital Signs: Therapy Vitals Temp: 97.8 F (36.6 C) Pulse Rate: 88 Resp: 16 BP: 130/75 Patient Position (if appropriate): Lying Oxygen  Therapy SpO2: 99 % O2 Device: Room Air Pain: Pain Assessment Pain Scale: 0-10 Pain Score: 3  Faces Pain Scale: Hurts little more Pain Type: Acute pain Pain Location: Hand Pain Orientation: Right;Left Pain Descriptors / Indicators: Aching Pain Onset: On-going Patients Stated Pain Goal: 0 Pain Intervention(s): Ambulation/increased activity;Repositioned Multiple Pain Sites: No ADL: ADL Equipment Provided: Other (comment)(dorsal wrist cuff) Eating: Supervision/safety Where Assessed-Eating: Wheelchair Grooming: Not assessed Where Assessed-Grooming: Bed level Upper Body Bathing: Moderate assistance Where Assessed-Upper Body Bathing: Bed level Lower Body Bathing: Other (comment), Maximal assistance(2 helpers) Where Assessed-Lower Body Bathing: Bed level Upper Body Dressing: Maximal assistance, Other (Comment)(2 helpers) Where Assessed-Upper Body Dressing: Bed level Lower Body Dressing: Maximal assistance, Other (Comment)(2 helpers) Where Assessed-Lower Body Dressing: Bed level Toileting: Not assessed Toilet Transfer: Not assessed Walk-In Shower Transfer: Not assessed   Therapy/Group: Individual Therapy  [name] 12/27/2017, 4:06 PM

## 2017-12-27 NOTE — Progress Notes (Signed)
[place] PHYSICAL MEDICINE & REHABILITATION PROGRESS NOTE   Subjective/Complaints: Stools a little loose requires supp, no sensation when it is inserted, stood with therapist yesterday , no dizziness Pt c/o hand pain radiating to forearms bilaterally no increased weakness, does not inhibit fxn Able to do pinch and place lozenge in mouth ROS: Patient denies  nausea, vomiting, diarrhea, cough, shortness of breath or chest pain,    Objective:   No results found. No results for input(s): WBC, HGB, HCT, PLT in the last 72 hours. No results for input(s): NA, K, CL, CO2, GLUCOSE, BUN, CREATININE, CALCIUM in the last 72 hours.  Intake/Output Summary (Last 24 hours) at 12/27/2017 0603 Last data filed at 12/27/2017 0220 Gross per 24 hour  Intake 480 ml  Output 3266 ml  Net -2786 ml     Physical Exam: Vital Signs Blood pressure 131/74, pulse 86, temperature (!) 97.4 F (36.3 C), resp. rate 18, height 5' 7" (1.702 m), weight 79 kg, SpO2 94 %. Constitutional: No distress . Vital signs reviewed. HEENT: EOMI, oral membranes moist Neck: supple Cardiovascular: RRR without murmur. No JVD    Respiratory: CTA Bilaterally without wheezes or rales. Normal effort    GI: BS +, non-tender, non-distended   Musculoskeletal: Mild edema distally in LE's and UE. No pain to palp or with UE ROM Neurological: Alert and oriented. Sensation diminished to light touch below T4, sl sense of pain Motor: RUE: Shoulder abduction, elbow flex 3+/5, elbow extension, wrist extension 1+ to 2-/5, hand gip 0/5--trace  Finger flexion in all 4 digits on left and index finger on R LUE: Shoulder abduction, elbow flexion 3-/5, wrist extension 2/5, hand gip 0/5--stable R LE: 1+/5 proximal to distal, LLE 2/5 prox to 2/5 distally-stable exam.  -no resting tone Skin: Incision c/d/i, . Psychiatric:pleasant  Assessment/Plan: 1. Functional deficits secondary to C6 SCI, motor/sensory incomplete which require 3+ hours per day of  interdisciplinary therapy in a comprehensive inpatient rehab setting.  Physiatrist is providing close team supervision and 24 hour management of active medical problems listed below.  Physiatrist and rehab team continue to assess barriers to discharge/monitor patient progress toward functional and medical goals  Care Tool:  Bathing  Bathing activity did not occur: Refused Body parts bathed by patient: Right arm, Left arm, Chest, Left upper leg, Right upper leg, Abdomen   Body parts bathed by helper: Front perineal area, Buttocks, Right lower leg, Left lower leg, Face Body parts n/a: Face, Front perineal area, Buttocks(previously washed )   Bathing assist Assist Level: 2 Helpers     Upper Body Dressing/Undressing Upper body dressing   What is the patient wearing?: Pull over shirt    Upper body assist Assist Level: Total Assistance - Patient < 25%    Lower Body Dressing/Undressing Lower body dressing      What is the patient wearing?: Pants     Lower body assist Assist for lower body dressing: Dependent - Patient 0%     Toileting Toileting    Toileting assist Assist for toileting: Dependent - Patient 0%     Transfers Chair/bed transfer  Transfers assist     Chair/bed transfer assist level: Maximal Assistance - Patient 25 - 49%     Locomotion Ambulation   Ambulation assist   Ambulation activity did not occur: Safety/medical concerns          Walk 10 feet activity   Assist  Walk 10 feet activity did not occur: Safety/medical concerns  Walk 50 feet activity   Assist Walk 50 feet with 2 turns activity did not occur: Safety/medical concerns         Walk 150 feet activity   Assist Walk 150 feet activity did not occur: Safety/medical concerns         Walk 10 feet on uneven surface  activity   Assist Walk 10 feet on uneven surfaces activity did not occur: Safety/medical concerns         Wheelchair     Assist Will  patient use wheelchair at discharge?: Yes Type of Wheelchair: Power Wheelchair activity did not occur: Safety/medical concerns  Wheelchair assist level: Supervision/Verbal cueing Max wheelchair distance: 800    Wheelchair 50 feet with 2 turns activity    Assist    Wheelchair 50 feet with 2 turns activity did not occur: Safety/medical concerns   Assist Level: Supervision/Verbal cueing   Wheelchair 150 feet activity     Assist Wheelchair 150 feet activity did not occur: Safety/medical concerns   Assist Level: Supervision/Verbal cueing     Medical Problem List and Plan: 1.  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  Status post C5-6 and C6-7 anterior cervical discectomy and fusion 11/29/2017.  TLSO back brace when out of bed Team conference today please see physician documentation under team conference tab, met with team face-to-face to discuss problems,progress, and goals. Formulized individual treatment plan based on medical history, underlying problem and comorbidities.  -Continue CIR therapies including PT, OT   -reviewed plan for bowels and bladder with pt/daughter.      2.  DVT Prophylaxis/Anticoagulation:  Acute mobile DVT Right common/proximal femoral vein  -IVCF placed by radiology  3. Pain Management:   Neurontin  462m qid with improvement and not oversedated    Valium 5 mg every 6 hours as needed muscle spasms  oxycodone as needed for pain 4. Mood:   -ongoing ego support, positive reinforcement  -neuropsych   5. Neuropsych: This patient is capable of making decisions on her own behalf. 6. Skin/Wound Care: Routine skin checks  - elevate limbs when possible  -sutures removed from left elbow  -better nutrition will also help edema mgt 7. Fluids/Electrolytes/Nutrition: encourage PO  -replacing potassium  - protein for hypoalbuminemia  -continue QD megace, eating better 75-100%     8.  Neurogenic bowel and  bladder.  Flomax 0.4 mg daily.    -I/O caths    -AM bowel program, senna-s + HS, AM suppository.      -  senna-s to just senna at bedtime   -Incont bowel and bladder. Soft and liquid stool may improve off amox 9.  Hypertension.  Norvasc 10 mg daily.   Vitals:   12/26/17 1447 12/26/17 2034  BP: 136/69 131/74  Pulse: 88 86  Resp: 18 18  Temp: 98.2 F (36.8 C) (!) 97.4 F (36.3 C)  SpO2: 95% 94%   Controlled on 11/26 10. Acute blood loss anemia:     -hgb 10.9  11/11-->10.2 11/18--> 10.3 11/21   -no signs of blood loss  -improving nutritional status 11.  Leukocytosis-resolved  WBCs 11.7 11/18--->down to 8.9 11/21  Urine with 100k E coli---amoxil completed- stools may firm up of abx   LOS: 20 days A FACE TO FACE EVALUATION WAS PERFORMED  ACharlett Blake11/26/2019, 6:03 AM

## 2017-12-27 NOTE — Progress Notes (Signed)
Physical Therapy Session Note  Patient Details  Name: Sherry [name] MRN: [id number] Date of Birth: 12/14/1934  Today's Date: 12/27/2017 PT Individual Time: 1100-1200 PT Individual Time Calculation (min): 60 min   Short Term Goals: Week 3:  PT Short Term Goal 1 (Week 3): Pt will utilize hooking to assist with maintaining side lying in bed in 75% of observations without cues.  PT Short Term Goal 2 (Week 3): Pt will initiate static standing with lift equipment x2 minutes PT Short Term Goal 3 (Week 3): Pt will maintain static sitting balance with min assist in 50% of observations  Skilled Therapeutic Interventions/Progress Updates: Pt received seated in bed, c/o pain as below and RN present during session to administer medication. Rolling R/L maxA with improving abdominal activation to assist with rotating lower trunk during rolling. R sidelying>sit maxA +2. Lateral scoot transfer to w/c with beasy board and maxA +1; pt able to activate LEs/core to assist with initiating a scoot. W/c management in hall in power chair with S and min cues for safety. Standing frame performed x10 min total, no symptoms of orthostatic hypotension; able to demo UE activation to assist with extending trunk to upright neutral position, demos glute/quad activation to assist with powering up to stand. Rest breaks with elbows on platform. Returned to room S w/c management. Remained reclined in w/c, seat belt intact and all needs in reach.      Therapy Documentation Precautions:  Precautions Precautions: Cervical, Back, Fall Precaution Comments: back precautions due to thoracic fx s/p ACDF; needs TLSO for all OOB activity but per order set does not need cervical collar  Required Braces or Orthoses: Spinal Brace Spinal Brace: Thoracolumbosacral orthotic, Applied in sitting position Restrictions Weight Bearing Restrictions: No Pain: Pain Assessment Pain Scale: 0-10 Pain Score: 6  Faces Pain Scale: Hurts even  more Pain Type: Acute pain Pain Location: Hand Pain Orientation: Right;Left Pain Descriptors / Indicators: Aching Pain Frequency: Constant Pain Onset: On-going Patients Stated Pain Goal: 3 Pain Intervention(s): Medication (See eMAR) Multiple Pain Sites: No    Therapy/Group: Individual Therapy  Corliss Skains 12/27/2017, 12:13 PM

## 2017-12-27 NOTE — Progress Notes (Signed)
Nutrition Follow-up  INTERVENTION:   - Continue MVI with minerals daily  - Continue Pro-stat 30 ml BID, each supplement provides 100 kcal and 15 grams of protein  - Continue afternoon snack daily  NUTRITION DIAGNOSIS:   Inadequate oral intake related to poor appetite, early satiety as evidenced by per patient/family report.  Ongoing  GOAL:   Patient will meet greater than or equal to 90% of their needs  Met  MONITOR:   PO intake, Labs, Skin, I & O's, Weight trends  REASON FOR ASSESSMENT:   Consult Poor PO  ASSESSMENT:   83 year old female with PMH significant for hypertension. Pt presented on 11/29/2017 after a fall with immediate quadriparesis. Cranial CT scan unremarkable for acute intracranial process. CT/MRI imaging showed C6-7 disc fracture and tear of anterior longitudinal ligament. Paraspinal muscle and interspinous edema from C1-C7 compatible ligament injury and muscle strain. Increased cord signal at C5 and C6. T11 coronal vertebral body fracture. Pt underwent C5-6, C6-7 anterior cervical discectomy and fusion on 11/29/2017 per Dr. Venetia Constable.  11/7 - s/p IVC filter placement  No new weight since 11/20. Per review of weight history, it appears that pt's weight has been stable over the past 1 month.  Spoke with pt at bedside. Pt in good spirits and reports good appetite and PO intake. Pt states that she is looking forward to lunch. Per pt, family bringing in some food (pasta dishes, chicken and dumplings). Pt is consuming these items in addition to items from meal tray. Pt reports that she is pleased with the food here.  Pt states that she is drinking her nutrition shake from home (homemade) three times daily. Pt states, "it is my staff of life." Pt also endorses taking Pro-stat.  Meal Completion: 75-100% x last 8 meals  Medications reviewed and include: Dulcolax, Pro-stat BID, Megace 400 mg daily, liquid MVI daily, K-dur 20 mEq daily, Senokot  Labs  reviewed.  Diet Order:   Diet Order            Diet regular Room service appropriate? Yes; Fluid consistency: Thin  Diet effective now              EDUCATION NEEDS:   Education needs have been addressed  Skin:  Skin Assessment: Skin Integrity Issues: DTI: right heel Incisions: left groin, neck Other: laceration to left elbow  Last BM:  11/26 (small type 6)  Height:   Ht Readings from Last 1 Encounters:  12/22/17 _0  (1.702 m)    Weight:   Wt Readings from Last 1 Encounters:  12/21/17 79 kg    Ideal Body Weight:  61.36 kg  BMI:  Body mass index is 27.28 kg/m.  Estimated Nutritional Needs:   Kcal:  1500-1700  Protein:  80-95 grams  Fluid:  1.5-1.7 L    Gaynell Face, MS, RD, LDN Inpatient Clinical Dietitian Pager: [phone] Weekend/After Hours: [phone]

## 2017-12-27 NOTE — Progress Notes (Signed)
Occupational Therapy Session Note  Patient Details  Name: Sherry [name] MRN: [id number] Date of Birth: [birth year]  Today's Date: 12/27/2017 OT Individual Time: 9622-2979 OT Individual Time Calculation (min): 75 min  and Today's Date: 12/27/2017 OT Missed Time: 15 Minutes Missed Time Reason: Nursing care   Short Term Goals: Week 3:  OT Short Term Goal 1 (Week 3): Pt will perform static sitting balance on EOB for 5 minutes with max A or less. OT Short Term Goal 2 (Week 3): Pt will perform UB dressing with mod A.  Skilled Therapeutic Interventions/Progress Updates:    Upon entering the room, pt supine in bed with daughter present. Pt with 9/10 c/o pain in B hands and RN provided pain medication. Pt verbalized burning sensation and thoughts that she may be soiled. Pt rolling to R with max A and having BM. OT assisted with hygiene but pt continued to go. She was placed on bed pan and then cleaned again only to continue emptying. Pt appeared to be able to empty better in side lying position. Pillows placed for comfort and OT notified NT that pt remaining on side for next few minutes to attempt to empty. 15 missed minutes secondary to continued bowel emptying.    Therapy Documentation Precautions:  Precautions Precautions: Cervical, Back, Fall Precaution Comments: back precautions due to thoracic fx s/p ACDF; needs TLSO for all OOB activity but per order set does not need cervical collar  Required Braces or Orthoses: Spinal Brace Spinal Brace: Thoracolumbosacral orthotic, Applied in sitting position Restrictions Weight Bearing Restrictions: No General: General OT Amount of Missed Time: 15 Minutes Vital Signs: Therapy Vitals Pulse Rate: 78 Resp: 19 BP: 119/68 Patient Position (if appropriate): Lying Oxygen Therapy SpO2: 95 % O2 Device: Room Air Pain: Pain Assessment Pain Scale: 0-10 Pain Score: 10-Worst pain ever Faces Pain Scale: Hurts worst Pain Type: Acute pain Pain  Location: Hand Pain Orientation: Right;Left Pain Descriptors / Indicators: Aching ADL: ADL Equipment Provided: Other (comment)(dorsal wrist cuff) Eating: Supervision/safety Where Assessed-Eating: Wheelchair Grooming: Not assessed Where Assessed-Grooming: Bed level Upper Body Bathing: Moderate assistance Where Assessed-Upper Body Bathing: Bed level Lower Body Bathing: Other (comment), Maximal assistance(2 helpers) Where Assessed-Lower Body Bathing: Bed level Upper Body Dressing: Maximal assistance, Other (Comment)(2 helpers) Where Assessed-Upper Body Dressing: Bed level Lower Body Dressing: Maximal assistance, Other (Comment)(2 helpers) Where Assessed-Lower Body Dressing: Bed level Toileting: Not assessed Toilet Transfer: Not assessed Walk-In Shower Transfer: Not assessed   Therapy/Group: Individual Therapy  [name] 12/27/2017, 9:46 AM

## 2017-12-27 NOTE — Plan of Care (Signed)
  Problem: Consults Goal: RH SPINAL CORD INJURY PATIENT EDUCATION Description  See Patient Education module for education specifics.  Outcome: Progressing   Problem: SCI BOWEL ELIMINATION Goal: RH STG MANAGE BOWEL WITH ASSISTANCE Description STG Manage Bowel with total Assistance.  Outcome: Progressing Goal: RH STG SCI MANAGE BOWEL PROGRAM W/ASSIST OR AS APPROPRIATE Description STG SCI Manage bowel program w/total assist or as appropriate.  Outcome: Progressing   Problem: SCI BLADDER ELIMINATION Goal: RH STG MANAGE BLADDER WITH MEDICATION WITH ASSISTANCE Description STG Manage Bladder With Medication With  Total Assistance.  Outcome: Progressing   Problem: RH SKIN INTEGRITY Goal: RH STG SKIN FREE OF INFECTION/BREAKDOWN Description With total assist  Outcome: Progressing Goal: RH STG MAINTAIN SKIN INTEGRITY WITH ASSISTANCE Description STG Maintain Skin Integrity With total Assistance.  Outcome: Progressing   Problem: RH SAFETY Goal: RH STG ADHERE TO SAFETY PRECAUTIONS W/ASSISTANCE/DEVICE Description STG Adhere to Safety Precautions With max Assistance/Device.  Outcome: Progressing   Problem: RH PAIN MANAGEMENT Goal: RH STG PAIN MANAGED AT OR BELOW PT'S PAIN GOAL Description At or below level 6  Outcome: Progressing   Problem: RH KNOWLEDGE DEFICIT SCI Goal: RH STG INCREASE KNOWLEDGE OF SELF CARE AFTER SCI Description Pt and family will be able to direct care post discharge using handout/resources independently  Outcome: Progressing

## 2017-12-28 ENCOUNTER — Inpatient Hospital Stay (HOSPITAL_COMMUNITY): Payer: Medicare HMO | Admitting: Physical Therapy

## 2017-12-28 ENCOUNTER — Inpatient Hospital Stay (HOSPITAL_COMMUNITY): Payer: Medicare HMO | Admitting: Occupational Therapy

## 2017-12-28 MED ORDER — GLYCERIN (LAXATIVE) 2.1 G RE SUPP
1.0000 | Freq: Every day | RECTAL | Status: DC
  Filled 2017-12-28: qty 1

## 2017-12-28 NOTE — Plan of Care (Signed)
  Problem: Consults Goal: RH SPINAL CORD INJURY PATIENT EDUCATION Description  See Patient Education module for education specifics.  Outcome: Progressing   Problem: SCI BOWEL ELIMINATION Goal: RH STG MANAGE BOWEL WITH ASSISTANCE Description STG Manage Bowel with total Assistance.  Outcome: Progressing Goal: RH STG SCI MANAGE BOWEL PROGRAM W/ASSIST OR AS APPROPRIATE Description STG SCI Manage bowel program w/total assist or as appropriate.  Outcome: Progressing   Problem: SCI BLADDER ELIMINATION Goal: RH STG MANAGE BLADDER WITH MEDICATION WITH ASSISTANCE Description STG Manage Bladder With Medication With  Total Assistance.  Outcome: Progressing   Problem: RH SKIN INTEGRITY Goal: RH STG SKIN FREE OF INFECTION/BREAKDOWN Description With total assist  Outcome: Progressing Goal: RH STG MAINTAIN SKIN INTEGRITY WITH ASSISTANCE Description STG Maintain Skin Integrity With total Assistance.  Outcome: Progressing   Problem: RH SAFETY Goal: RH STG ADHERE TO SAFETY PRECAUTIONS W/ASSISTANCE/DEVICE Description STG Adhere to Safety Precautions With max Assistance/Device.  Outcome: Progressing   Problem: RH PAIN MANAGEMENT Goal: RH STG PAIN MANAGED AT OR BELOW PT'S PAIN GOAL Description At or below level 6  Outcome: Progressing   Problem: RH KNOWLEDGE DEFICIT SCI Goal: RH STG INCREASE KNOWLEDGE OF SELF CARE AFTER SCI Description Pt and family will be able to direct care post discharge using handout/resources independently  Outcome: Progressing

## 2017-12-28 NOTE — Progress Notes (Signed)
Physical Therapy Session Note  Patient Details  Name: Sherry [name] MRN: [id number] Date of Birth: [birth year]  Today's Date: 12/28/2017 PT Individual Time: 1000-1100 and 1430-1530 PT Individual Time Calculation (min): 60 min and 60 min (total 120 min)   Short Term Goals: Week 3:  PT Short Term Goal 1 (Week 3): Pt will utilize hooking to assist with maintaining side lying in bed in 75% of observations without cues.  PT Short Term Goal 2 (Week 3): Pt will initiate static standing with lift equipment x2 minutes PT Short Term Goal 3 (Week 3): Pt will maintain static sitting balance with min assist in 50% of observations  Skilled Therapeutic Interventions/Progress Updates: Tx 1: Pt received seated in bed finishing breakfast; requires min cues for technique while utilizing universal cuff, and requires assist to doff cuff when finished with food in order to use BUEs to grasp cup. Supine>sit with HOB elevated, maxA +1. Lateral scoot transfer x3 during session maxA +1 with beasy board to level surfaces. Discussed with pt plans for utilizing hospital bed at home to raise/lower for downhill transfers or set to level height for ease of transferring. DME provider Felton Clinton present for w/c evaluation; educated pt regarding goal of evaluation and plan for d/c with loaner chair until permanent chair is delivered. Sitting balance x10 min statically on edge of mat with S overall, minimal use of UEs to maintain balance. Able to demo UEs raising slowly overhead to further challenge balance, no LOB posteriorly and pt demonstrates improving awareness and ability to maintain midline sitting balance progressing towards dynamic balance. Returned to room S w/c management; remained semi-reclined in w/c, seat belt intact and all needs in reach.   Tx 2: Pt received in bed, denies pain but reports "having a bad day" and was frustrated by being uncomfortable in w/c earlier and having to wait for help to get back to bed.  Offered emotional support and pt agreeable to treatment. Prepared to sit EOB and perform standing frame however pt reports she suspects she was incontinent. Rolling R/L modA using UEs on bedrails; pt had been incontinent and continued to be incontinent laying on side. Therapist assisted with abdominal pressure and cueing pt to bear down to assist with emptying; ultimately placed pt on bedpan in chair position to allow pt to empty completely. Rolling R/L to don clean brief, don pants. Pt agreeable to remain in bed as she is very tired. PROM to BLE glutes, hamstrings, gastroc, hip IR/ER. PNF techniques to facilitate extension through LEs with manual resistance. Performed BLE bridging x10 reps, crunches x10 reps; overall improving activation of core/glutes. BUE PROM with pec stretch on towel roll "T" and "Y" positions x2 min each; educated pt TM:[id number] of lying supine with HOB flat and UEs extended to stretch flexor musculature. Remained in bed, alarm intact and brother present, all needs in reach.      Therapy Documentation Precautions:  Precautions Precautions: Cervical, Back, Fall Precaution Comments: back precautions due to thoracic fx s/p ACDF; needs TLSO for all OOB activity but per order set does not need cervical collar  Required Braces or Orthoses: Spinal Brace Spinal Brace: Thoracolumbosacral orthotic, Applied in sitting position Restrictions Weight Bearing Restrictions: No Pain: Pain Assessment Pain Scale: 0-10 Pain Score: 7  Pain Type: Acute pain Pain Location: Hand Pain Orientation: Right;Left Pain Descriptors / Indicators: Aching Pain Frequency: Constant Pain Onset: On-going Patients Stated Pain Goal: 0 Pain Intervention(s): Medication (See eMAR);Relaxation    Therapy/Group: Individual Therapy  Benjamine Mola  J Krishna Heuer 12/28/2017, 11:14 AM

## 2017-12-28 NOTE — Progress Notes (Signed)
PHYSICAL MEDICINE & REHABILITATION PROGRESS NOTE   Subjective/Complaints: No pain today ROS: Patient denies  nausea, vomiting, diarrhea, cough, shortness of breath or chest pain,    Objective:   No results found. No results for input(s): WBC, HGB, HCT, PLT in the last 72 hours. No results for input(s): NA, K, CL, CO2, GLUCOSE, BUN, CREATININE, CALCIUM in the last 72 hours.  Intake/Output Summary (Last 24 hours) at 12/28/2017 1002 Last data filed at 12/28/2017 0939 Gross per 24 hour  Intake 480 ml  Output 2775 ml  Net -2295 ml     Physical Exam: Vital Signs Blood pressure (!) 112/57, pulse 75, temperature 99.8 F (37.7 C), temperature source Oral, resp. rate 16, height 5\' 7"  (1.702 m), weight 79 kg, SpO2 94 %. Constitutional: No distress . Vital signs reviewed. HEENT: EOMI, oral membranes moist Neck: supple Cardiovascular: RRR without murmur. No JVD    Respiratory: CTA Bilaterally without wheezes or rales. Normal effort    GI: BS +, non-tender, non-distended   Musculoskeletal: Mild edema distally in LE's and UE. No pain to palp or with UE ROM Neurological: Alert and oriented. Sensation diminished to light touch below T4, sl sense of pain Motor: RUE: Shoulder abduction, elbow flex 3+/5, elbow extension, wrist extension 1+ to 2-/5, hand gip 0/5--trace  Finger flexion in all 4 digits on left and index finger on R LUE: Shoulder abduction, elbow flexion 3-/5, wrist extension 2/5, hand gip 0/5--stable R LE: 1+/5 proximal to distal, LLE 2/5 prox to 2/5 distally-stable exam.  -no resting tone Skin: Incision c/d/i, . Psychiatric:pleasant, no lability or agitation  Assessment/Plan: 1. 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  Physiatrist is providing close team supervision and 24 hour management of active medical problems listed  below.  Physiatrist and rehab team continue to assess barriers to discharge/monitor patient progress toward functional and medical goals  Care Tool:  Bathing  Bathing activity did not occur: Refused Body parts bathed by patient: Right arm, Left arm, Chest, Left upper leg, Right upper leg, Abdomen   Body parts bathed by helper: Front perineal area, Buttocks, Right lower leg, Left lower leg, Face Body parts n/a: Face, Front perineal area, Buttocks(previously washed )   Bathing assist Assist Level: 2 Helpers     Upper Body Dressing/Undressing Upper body dressing   What is the patient wearing?: Pull over shirt    Upper body assist Assist Level: Maximal Assistance - Patient 25 - 49%    Lower Body Dressing/Undressing Lower body dressing      What is the patient wearing?: Pants     Lower body assist Assist for lower body dressing: 2 Helpers     Toileting Toileting    Toileting assist Assist for toileting: Dependent - Patient 0%     Transfers Chair/bed transfer  Transfers assist     Chair/bed transfer assist level: 2 Helpers     Locomotion Ambulation   Ambulation assist   Ambulation activity did not occur: Safety/medical concerns          Walk 10 feet activity   Assist  Walk 10 feet activity did not occur: Safety/medical concerns        Walk 50 feet activity   Assist Walk 50 feet with 2 turns activity did not occur: Safety/medical concerns         Walk 150 feet activity   Assist Walk 150 feet activity  did not occur: Safety/medical concerns         Walk 10 feet on uneven surface  activity   Assist Walk 10 feet on uneven surfaces activity did not occur: Safety/medical concerns         Wheelchair     Assist Will patient use wheelchair at discharge?: Yes Type of Wheelchair: Power Wheelchair activity did not occur: Safety/medical concerns  Wheelchair assist level: Supervision/Verbal cueing Max wheelchair distance: 150     Wheelchair 50 feet with 2 turns activity    Assist    Wheelchair 50 feet with 2 turns activity did not occur: Safety/medical concerns   Assist Level: Supervision/Verbal cueing   Wheelchair 150 feet activity     Assist Wheelchair 150 feet activity did not occur: Safety/medical concerns   Assist Level: Supervision/Verbal cueing     Medical Problem List and Plan: 1.  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  Status post C5-6 and C6-7 anterior cervical discectomy and fusion 11/29/2017.  TLSO back brace when out of bed .  -Continue CIR therapies including PT, OT  .      2.  DVT Prophylaxis/Anticoagulation:  Acute mobile DVT Right common/proximal femoral vein  -IVCF placed by radiology  3. Pain Management:   Neurontin  400mg  qid with improvement and not oversedated    Valium 5 mg every 6 hours as needed not using  oxycodone as needed for pain 4. Mood:   -ongoing ego support, positive reinforcement  -neuropsych   5. Neuropsych: This patient is capable of making decisions on her own behalf. 6. Skin/Wound Care: Routine skin checks  - elevate limbs when possible  -sutures removed from left elbow  -better nutrition will also help edema mgt 7. Fluids/Electrolytes/Nutrition: encourage PO  -replacing potassium  - protein for hypoalbuminemia  -continue QD megace, eating better 75-100%     8.  Neurogenic bowel and bladder.  Flomax 0.4 mg daily.    -I/O caths    -AM bowel program, senna-s + HS, AM suppository.      -  senna-s to just senna at bedtime   -Incont bowel and bladder. Loose freq stools, starting fibercon, will substitute glycerin supp for dulc 9.  Hypertension.  Norvasc 10 mg daily.   Vitals:   12/27/17 2102 12/28/17 0436  BP: 126/66 (!) 112/57  Pulse: 90 75  Resp: 16 16  Temp: 98.6 F (37 C) 99.8 F (37.7 C)  SpO2: 99% 94%   Controlled on 11/27 10. Acute blood loss anemia:     -hgb 10.9  11/11-->10.2  11/18--> 10.3 11/21   -no signs of blood loss  -improving nutritional status   LOS: 21 days A FACE TO FACE EVALUATION WAS PERFORMED  Charlett Blake 12/28/2017, 10:02 AM

## 2017-12-28 NOTE — Progress Notes (Signed)
Social Work Patient ID: Sherry [name], female   DOB: [birth year], 83 y.o.   MRN: [id number]   Have reviewed team conference with pt and daughter.  Both aware and agree with team's report of good gains this past week and pt making good strides toward a +1 for transfers!  Team feeling strongly about keeping targeted d/c date of 12/11 at this point.  Pt remains extremely motivated. Team continues to work on b/b program as well. Will continue to follow.  Genee Rann, LCSW

## 2017-12-28 NOTE — Progress Notes (Signed)
Occupational Therapy Session Note  Patient Details  Name: Sherry [name] MRN: [id number] Date of Birth: 1934/07/16  Today's Date: 12/28/2017 OT Individual Time: 6812-7517 OT Individual Time Calculation (min): 68 min    Short Term Goals: Week 3:  OT Short Term Goal 1 (Week 3): Pt will perform static sitting balance on EOB for 5 minutes with max A or less. OT Short Term Goal 2 (Week 3): Pt will perform UB dressing with mod A.  Skilled Therapeutic Interventions/Progress Updates:    Upon entering the room, pt on bedpan for bowel program. Pt with reports of not sleeping well as she had 3 I & O caths last night. Pt agreeable to OT intervention and OT encouraged to to set a goal for herself and for positive affirmations this morning. OT assisted pt with meal while on bedpan for time management.  Pt rolling L <> R with small BM this session and total A for hygiene. +2 assistance to don B LE teds and pants over B hips. Long sitting from elevated HOB with min A and able to partially thread B UEs this session. Assist needed to fully tread and pull over neck and down trunk. Pt needing mod A for sitting balance in this position. NT arrived to cath pt at end of session. All needs within reach.   Therapy Documentation Precautions:  Precautions Precautions: Cervical, Back, Fall Precaution Comments: back precautions due to thoracic fx s/p ACDF; needs TLSO for all OOB activity but per order set does not need cervical collar  Required Braces or Orthoses: Spinal Brace Spinal Brace: Thoracolumbosacral orthotic, Applied in sitting position Restrictions Weight Bearing Restrictions: No General:   Vital Signs:  Pain: Pain Assessment Pain Scale: 0-10 Pain Score: 3  Pain Intervention(s): Medication (See eMAR) ADL: ADL Equipment Provided: Other (comment)(dorsal wrist cuff) Eating: Supervision/safety Where Assessed-Eating: Wheelchair Grooming: Not assessed Where Assessed-Grooming: Bed level Upper  Body Bathing: Moderate assistance Where Assessed-Upper Body Bathing: Bed level Lower Body Bathing: Other (comment), Maximal assistance(2 helpers) Where Assessed-Lower Body Bathing: Bed level Upper Body Dressing: Maximal assistance, Other (Comment)(2 helpers) Where Assessed-Upper Body Dressing: Bed level Lower Body Dressing: Maximal assistance, Other (Comment)(2 helpers) Where Assessed-Lower Body Dressing: Bed level Toileting: Not assessed Toilet Transfer: Not assessed Walk-In Shower Transfer: Not assessed   Therapy/Group: Individual Therapy  [name] 12/28/2017, 9:37 AM

## 2017-12-29 ENCOUNTER — Other Ambulatory Visit: Payer: Self-pay

## 2017-12-29 MED ORDER — GLYCERIN (LAXATIVE) 2.1 G RE SUPP
1.0000 | Freq: Every day | RECTAL | Status: DC
  Administered 2017-12-30 – 2018-01-01 (×3): 1 via RECTAL
  Filled 2017-12-29 (×4): qty 1

## 2017-12-29 MED ORDER — GLYCERIN (LAXATIVE) 2.1 G RE SUPP: 1.0000 | Freq: Every day | RECTAL | Status: DC

## 2017-12-29 NOTE — Progress Notes (Signed)
[place] PHYSICAL MEDICINE & REHABILITATION PROGRESS NOTE   Subjective/Complaints: No pain today ROS: Patient denies  nausea, vomiting, diarrhea, cough, shortness of breath or chest pain,    Objective:   No results found. No results for input(s): WBC, HGB, HCT, PLT in the last 72 hours. No results for input(s): NA, K, CL, CO2, GLUCOSE, BUN, CREATININE, CALCIUM in the last 72 hours.  Intake/Output Summary (Last 24 hours) at 12/29/2017 0931 Last data filed at 12/29/2017 0855 Gross per 24 hour  Intake 540 ml  Output 2125 ml  Net -1585 ml     Physical Exam: Vital Signs Blood pressure 116/62, pulse 75, temperature 98.3 F (36.8 C), temperature source Oral, resp. rate 19, height 5\' 7"  (1.702 m), weight 79 kg, SpO2 100 %. Constitutional: No distress . Vital signs reviewed. HEENT: EOMI, oral membranes moist Neck: supple Cardiovascular: RRR without murmur. No JVD    Respiratory: CTA Bilaterally without wheezes or rales. Normal effort    GI: BS +, non-tender, non-distended   Musculoskeletal: Mild edema distally in LE's and UE. No pain to palp or with UE ROM Neurological: Alert and oriented. Sensation diminished to light touch below T4, sl sense of pain Motor: RUE: Shoulder abduction, elbow flex 3+/5, elbow extension, wrist extension 1+ to 2-/5, hand gip 0/5--trace  Finger flexion in all 4 digits on left and index finger on R LUE: Shoulder abduction, elbow flexion 3-/5, wrist extension 2/5, hand gip 0/5--stable R LE: 1+/5 proximal to distal, LLE 2/5 prox to 2/5 distally-stable exam.  -no resting tone Skin: Incision c/d/i, . Psychiatric:pleasant, no lability or agitation  Assessment/Plan: 1. 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  Physiatrist is providing close team supervision and 24 hour management of active medical problems listed  below.  Physiatrist and rehab team continue to assess barriers to discharge/monitor patient progress toward functional and medical goals  Care Tool:  Bathing  Bathing activity did not occur: Refused Body parts bathed by patient: Right arm, Left arm, Chest, Left upper leg, Right upper leg, Abdomen   Body parts bathed by helper: Front perineal area, Buttocks, Right lower leg, Left lower leg, Face Body parts n/a: Face, Front perineal area, Buttocks(previously washed )   Bathing assist Assist Level: 2 Helpers     Upper Body Dressing/Undressing Upper body dressing   What is the patient wearing?: Pull over shirt    Upper body assist Assist Level: Maximal Assistance - Patient 25 - 49%    Lower Body Dressing/Undressing Lower body dressing      What is the patient wearing?: Pants     Lower body assist Assist for lower body dressing: 2 Helpers     Toileting Toileting    Toileting assist Assist for toileting: Dependent - Patient 0%     Transfers Chair/bed transfer  Transfers assist     Chair/bed transfer assist level: Maximal Assistance - Patient 25 - 49%     Locomotion Ambulation   Ambulation assist   Ambulation activity did not occur: Safety/medical concerns          Walk 10 feet activity   Assist  Walk 10 feet activity did not occur: Safety/medical concerns        Walk 50 feet activity   Assist Walk 50 feet with 2 turns activity did not occur: Safety/medical concerns         Walk 150 feet activity   Assist  Walk 150 feet activity did not occur: Safety/medical concerns         Walk 10 feet on uneven surface  activity   Assist Walk 10 feet on uneven surfaces activity did not occur: Safety/medical concerns         Wheelchair     Assist Will patient use wheelchair at discharge?: Yes Type of Wheelchair: Power Wheelchair activity did not occur: Safety/medical concerns  Wheelchair assist level: Supervision/Verbal cueing Max  wheelchair distance: 150    Wheelchair 50 feet with 2 turns activity    Assist    Wheelchair 50 feet with 2 turns activity did not occur: Safety/medical concerns   Assist Level: Supervision/Verbal cueing   Wheelchair 150 feet activity     Assist Wheelchair 150 feet activity did not occur: Safety/medical concerns   Assist Level: Supervision/Verbal cueing     Medical Problem List and Plan: 1.  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  Status post C5-6 and C6-7 anterior cervical discectomy and fusion 11/29/2017.  TLSO back brace when out of bed .  -Continue CIR therapies including PT, OT  .      2.  DVT Prophylaxis/Anticoagulation:  Acute mobile DVT Right common/proximal femoral vein  -IVCF placed by radiology  3. Pain Management:   Neurontin  400mg  qid with improvement and not oversedated    Valium 5 mg every 6 hours as needed not using- will d/c  oxycodone as needed for pain 4. Mood:   -ongoing ego support, positive reinforcement  -neuropsych   5. Neuropsych: This patient is capable of making decisions on her own behalf. 6. Skin/Wound Care: Routine skin checks  - elevate limbs when possible  -sutures removed from left elbow  -better nutrition will also help edema mgt 7. Fluids/Electrolytes/Nutrition: encourage PO  -replacing potassium  - protein for hypoalbuminemia  -continue QD megace, eating better 75-100%     8.  Neurogenic bowel and bladder.  Flomax 0.4 mg daily.    -I/O caths    -AM bowel program, senna-s + HS, AM suppository.      -  senna-s to just senna at bedtime   -Incont bowel and bladder. Loose freq stools, starting fibercon, will substitute glycerin supp for dulc and monitor result  9.  Hypertension.  Norvasc 10 mg daily.   Vitals:   12/28/17 1950 12/29/17 0438  BP: 116/61 116/62  Pulse: 86 75  Resp: 19 19  Temp: 98.1 F (36.7 C) 98.3 F (36.8 C)  SpO2: 95% 100%   Controlled on 11/28 10.  Acute blood loss anemia:     -hgb 10.9  11/11-->10.2 11/18--> 10.3 11/21 , recheck in am  -no signs of blood loss  -improving nutritional status   LOS: 22 days A FACE TO FACE EVALUATION WAS PERFORMED  Charlett Blake 12/29/2017, 9:31 AM

## 2017-12-30 ENCOUNTER — Inpatient Hospital Stay (HOSPITAL_COMMUNITY): Payer: Medicare HMO | Admitting: Occupational Therapy

## 2017-12-30 ENCOUNTER — Inpatient Hospital Stay (HOSPITAL_COMMUNITY): Payer: Medicare HMO

## 2017-12-30 LAB — CBC
HEMATOCRIT: 34.4 % — AB (ref 36.0–46.0)
Hemoglobin: 10.6 g/dL — ABNORMAL LOW (ref 12.0–15.0)
MCH: 27.7 pg (ref 26.0–34.0)
MCHC: 30.8 g/dL (ref 30.0–36.0)
MCV: 90.1 fL (ref 80.0–100.0)
NRBC: 0 % (ref 0.0–0.2)
PLATELETS: 324 10*3/uL (ref 150–400)
RBC: 3.82 MIL/uL — ABNORMAL LOW (ref 3.87–5.11)
RDW: 15.7 % — AB (ref 11.5–15.5)
WBC: 10.4 10*3/uL (ref 4.0–10.5)

## 2017-12-30 NOTE — Progress Notes (Signed)
[place] PHYSICAL MEDICINE & REHABILITATION PROGRESS NOTE   Subjective/Complaints: Concerned about weight and abd girth.  We discussed loss of abd muscle tone as a contributng factor  ROS: Patient denies  nausea, vomiting, diarrhea, cough, shortness of breath or chest pain,    Objective:   No results found. Recent Labs    12/30/17 0636  WBC 10.4  HGB 10.6*  HCT 34.4*  PLT 324   No results for input(s): NA, K, CL, CO2, GLUCOSE, BUN, CREATININE, CALCIUM in the last 72 hours.  Intake/Output Summary (Last 24 hours) at 12/30/2017 0852 Last data filed at 12/30/2017 0837 Gross per 24 hour  Intake 600 ml  Output 2500 ml  Net -1900 ml     Physical Exam: Vital Signs Blood pressure 135/73, pulse 95, temperature 98.2 F (36.8 C), resp. rate 16, height 5\' 7"  (1.702 m), weight 79 kg, SpO2 97 %. Constitutional: No distress . Vital signs reviewed. HEENT: EOMI, oral membranes moist Neck: supple Cardiovascular: RRR without murmur. No JVD    Respiratory: CTA Bilaterally without wheezes or rales. Normal effort    GI: BS +, non-tender, non-distended   Musculoskeletal: Mild edema distally in LE's and UE. No pain to palp or with UE ROM Neurological: Alert and oriented. Sensation diminished to light touch below T4, sl sense of pain Motor: RUE: Shoulder abduction, elbow flex 3+/5, elbow extension, wrist extension 1+ to 2-/5, hand gip 0/5--trace  Finger flexion in all 4 digits on left and index finger on R LUE: Shoulder abduction, elbow flexion 3-/5, wrist extension 2/5, hand gip 0/5--stable R LE: 1+/5 proximal to distal, LLE 2/5 prox to 2/5 distally-stable exam.  -no resting tone Skin: Incision c/d/i, . Psychiatric:pleasant, no lability or agitation  Assessment/Plan: 1. 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  Physiatrist is providing close team supervision  and 24 hour management of active medical problems listed below.  Physiatrist and rehab team continue to assess barriers to discharge/monitor patient progress toward functional and medical goals  Care Tool:  Bathing  Bathing activity did not occur: Refused Body parts bathed by patient: Right arm, Left arm, Chest, Left upper leg, Right upper leg, Abdomen   Body parts bathed by helper: Front perineal area, Buttocks, Right lower leg, Left lower leg, Face Body parts n/a: Face, Front perineal area, Buttocks(previously washed )   Bathing assist Assist Level: 2 Helpers     Upper Body Dressing/Undressing Upper body dressing   What is the patient wearing?: Pull over shirt    Upper body assist Assist Level: Maximal Assistance - Patient 25 - 49%    Lower Body Dressing/Undressing Lower body dressing      What is the patient wearing?: Pants     Lower body assist Assist for lower body dressing: 2 Helpers     Toileting Toileting    Toileting assist Assist for toileting: Dependent - Patient 0%     Transfers Chair/bed transfer  Transfers assist     Chair/bed transfer assist level: Maximal Assistance - Patient 25 - 49%     Locomotion Ambulation   Ambulation assist   Ambulation activity did not occur: Safety/medical concerns          Walk 10 feet activity   Assist  Walk 10 feet activity did not occur: Safety/medical concerns        Walk 50 feet activity   Assist Walk 50 feet with 2 turns  activity did not occur: Safety/medical concerns         Walk 150 feet activity   Assist Walk 150 feet activity did not occur: Safety/medical concerns         Walk 10 feet on uneven surface  activity   Assist Walk 10 feet on uneven surfaces activity did not occur: Safety/medical concerns         Wheelchair     Assist Will patient use wheelchair at discharge?: Yes Type of Wheelchair: Power Wheelchair activity did not occur: Safety/medical  concerns  Wheelchair assist level: Supervision/Verbal cueing Max wheelchair distance: 150    Wheelchair 50 feet with 2 turns activity    Assist    Wheelchair 50 feet with 2 turns activity did not occur: Safety/medical concerns   Assist Level: Supervision/Verbal cueing   Wheelchair 150 feet activity     Assist Wheelchair 150 feet activity did not occur: Safety/medical concerns   Assist Level: Supervision/Verbal cueing     Medical Problem List and Plan: 1.  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  Status post C5-6 and C6-7 anterior cervical discectomy and fusion 11/29/2017.  TLSO back brace when out of bed Discussed need to start on isometric abd muscle exercise  -Continue CIR therapies including PT, OT  .      2.  DVT Prophylaxis/Anticoagulation:  Acute mobile DVT Right common/proximal femoral vein  -IVCF placed by radiology  3. Pain Management:   Neurontin  400mg  qid with improvement and not oversedated      oxycodone as needed for pain 4. Mood:   -ongoing ego support, positive reinforcement  -neuropsych   5. Neuropsych: This patient is capable of making decisions on her own behalf. 6. Skin/Wound Care: Routine skin checks  - elevate limbs when possible  -sutures removed from left elbow  -better nutrition will also help edema mgt 7. Fluids/Electrolytes/Nutrition: encourage PO  -replacing potassium  - protein for hypoalbuminemia  -D/C megace, weekly weights     8.  Neurogenic bowel and bladder.  Flomax 0.4 mg daily.    -I/O caths    -AM bowel program, senna-s + HS, AM suppository.      -  senna-s to just senna at bedtime   -Incont bowel and bladder. Loose freq stools, starting fibercon, will substitute glycerin supp for dulc and monitor result  9.  Hypertension.  Norvasc 10 mg daily.   Vitals:   12/29/17 0438 12/29/17 1629  BP: 116/62 135/73  Pulse: 75 95  Resp: 19 16  Temp: 98.3 F (36.8 C) 98.2 F (36.8  C)  SpO2: 100% 97%   Controlled on 11/29 10. Acute blood loss anemia:     -hgb 10.9  11/11-->10.2 11/18--> 10.3 11/21 , recheck in am  -no signs of blood loss  -improving nutritional status   LOS: 23 days A FACE TO FACE EVALUATION WAS PERFORMED  Charlett Blake 12/30/2017, 8:52 AM

## 2017-12-30 NOTE — Progress Notes (Signed)
Physical Therapy Session Note  Patient Details  Name: Sherry [name] MRN: [id number] Date of Birth: [birth year]  Today's Date: 12/30/2017 PT Individual Time: 1500-1600 PT Individual Time Calculation (min): 60 min   Short Term Goals: Week 3:  PT Short Term Goal 1 (Week 3): Pt will utilize hooking to assist with maintaining side lying in bed in 75% of observations without cues.  PT Short Term Goal 2 (Week 3): Pt will initiate static standing with lift equipment x2 minutes PT Short Term Goal 3 (Week 3): Pt will maintain static sitting balance with min assist in 50% of observations  Skilled Therapeutic Interventions/Progress Updates:    Pt supine in bed upon PT arrival, agreeable to therapy tx and reports pain 4/10, RN administering pain meds. Pt with increased swelling noted in L LE, therapist applied ACE wrap over teds for edema control. Therapist donned shoes total assist for time management. Pt reports feeling tight today, therapist performed manual hamstring nd hip stretches 2 x 30 sec each bilaterally. Pt transferred supine>sitting EOB with total assist. Sitting EOB pt worked on static sitting balance while therapist set up w/c for transfer, CGA for sitting balance. Pt performed sit<>sidelying on R elbow with min assist while therapist placed slideboard, total assist +2 for transfer. Pt navigated power w/c to the gym with supervision/verbal cueing. Pt performed slideboard transfer to mat total assist +2. Pt attempted to perform x 2 sit<>stands from elevated mat 3 musketeers, pt able to clear buttocks from mat but does not come to full standing this session secondary to pain. Pt reports pelvic pain and rates 6/10, decided to hold on standing until next session. Pt worked on dynamic sitting balance reaching outside BOS with CGA and verbal cueing. Pt performed x 3 sitting<>sidelying on elbow in each direction with min guard assist and verbal cues to keep anterior weightshift. Pt performed slideboard  transfer back to w/c max assist +1 with pt able to push/pull using UEs, trace pushing through LEs. Pt propelled back to room and left with needs in reach.   Therapy Documentation Precautions:  Precautions Precautions: Cervical, Back, Fall Precaution Comments: back precautions due to thoracic fx s/p ACDF; needs TLSO for all OOB activity but per order set does not need cervical collar  Required Braces or Orthoses: Spinal Brace Spinal Brace: Thoracolumbosacral orthotic, Applied in sitting position Restrictions Weight Bearing Restrictions: No    Therapy/Group: Individual Therapy  Netta Corrigan, PT, DPT 12/30/2017, 8:01 AM

## 2017-12-30 NOTE — Progress Notes (Signed)
Occupational Therapy Session Note  Patient Details  Name: Sherry [name] MRN: [id number] Date of Birth: Jun 07, 1934  Today's Date: 12/30/2017 OT Individual Time: 9233-0076 OT Individual Time Calculation (min): 88 min    Short Term Goals: Week 3:  OT Short Term Goal 1 (Week 3): Pt will perform static sitting balance on EOB for 5 minutes with max A or less. OT Short Term Goal 2 (Week 3): Pt will perform UB dressing with mod A.  Skilled Therapeutic Interventions/Progress Updates:    Upon entering the room, Pt supine in bed and on bed pan with bowel program being initiated. OT assisted RN with hygiene. Pt rolling L <> R with max A and hooking UE onto rail. Pt needing total A to don B TED hose and pants. Supine >sit with max A to EOB. Mod- max  static sitting balance with cuing and manual assist for forward weight shift. +2 assistance from bed >wheelchair via slide board. Pt navigated self in power chair to day room and into standing frame with increased time. Pt standing for 6 minutes and 2 minutes respectively with rest breaks as needed. While standing BP was 137/80 and HR of 107. Pt reports feeling "weak" and upset that this task required more effort than last time. Dycem utilized for hands and elbow with mod cuing for upright posture. Pt driving self back to room with soft call bell and all needed items within reach upon exiting.  Therapy Documentation Precautions:  Precautions Precautions: Cervical, Back, Fall Precaution Comments: back precautions due to thoracic fx s/p ACDF; needs TLSO for all OOB activity but per order set does not need cervical collar  Required Braces or Orthoses: Spinal Brace Spinal Brace: Thoracolumbosacral orthotic, Applied in sitting position Restrictions Weight Bearing Restrictions: No General:   Vital Signs:   Pain:   ADL: ADL Equipment Provided: Other (comment)(dorsal wrist cuff) Eating: Supervision/safety Where Assessed-Eating: Wheelchair Grooming:  Not assessed Where Assessed-Grooming: Bed level Upper Body Bathing: Moderate assistance Where Assessed-Upper Body Bathing: Bed level Lower Body Bathing: Other (comment), Maximal assistance(2 helpers) Where Assessed-Lower Body Bathing: Bed level Upper Body Dressing: Maximal assistance, Other (Comment)(2 helpers) Where Assessed-Upper Body Dressing: Bed level Lower Body Dressing: Maximal assistance, Other (Comment)(2 helpers) Where Assessed-Lower Body Dressing: Bed level Toileting: Not assessed Toilet Transfer: Not assessed Walk-In Shower Transfer: Not assessed   Therapy/Group: Individual Therapy  [name] 12/30/2017, 11:05 AM

## 2017-12-30 NOTE — Progress Notes (Signed)
Occupational Therapy Session Note  Patient Details  Name: Sherry [name] MRN: [id number] Date of Birth: [birth year]  Today's Date: 12/30/2017 OT Individual Time: 1120-1204 and 1305-1400 OT Individual Time Calculation (min): 44 min and 55 min  Short Term Goals: Week 3:  OT Short Term Goal 1 (Week 3): Pt will perform static sitting balance on EOB for 5 minutes with max A or less. OT Short Term Goal 2 (Week 3): Pt will perform UB dressing with mod A.  Skilled Therapeutic Interventions/Progress Updates:    Pt greeted in TIS, had suppository this AM and wanted to be checked if she had BM. With vcs and Min A pt lowered PWC in prep for slideboard transfer. She did so with 2 assist, 2nd helper stabilizing equipment. 2 helpers for lowering to supine and boosting pt up in bed. With hands positioned, pt able to hook thumbs and lower pants a bit.  Facilitated arm hook or hand placement on bedrails when rolling Rt>Lt for fully lowering pants. Pt with small BM, and perihygiene completed. Tried bridging for elevating pants over hips, with pt able to minimally clear buttocks off of bed when LEs were positioned and supported. She rolled Rt>Lt for fully elevating pants over hips. For remainder of session worked on UE strengthening via arm circles and squeezing foam blocks. Pt required several rest breaks due to fatigue. At end of session she was left with all needs within reach.    2nd Session 1:1 tx (55 min) Pt greeted in bed, just starting to eat lunch. Agreeable to transition EOB to work on sitting balance. 2 helpers for supine<sit with manual facilitation for UE placement. Once EOB, Mod A for balance maintenance while 2nd helper donned TLSO. She required Mod-Max A for balance EOB while eating lunch using wrist cuff. Vcs required for anterior weight shifting due to posterior bias. When she was finished, continued with core strengthening via long sitting in bed. Used dycem for improved UE grip on B bedrails. She  required Mod-Max A and manual facilitation for anterior weight shifting to maintain position (with 2 helpers for supine<long sit). Able to tolerate 5 minutes at most before requiring supine rest. At end of session pt returned to bed to be cathed. Soft call bell within reach.    Therapy Documentation Precautions:  Precautions Precautions: Cervical, Back, Fall Precaution Comments: back precautions due to thoracic fx s/p ACDF; needs TLSO for all OOB activity but per order set does not need cervical collar  Required Braces or Orthoses: Spinal Brace Spinal Brace: Thoracolumbosacral orthotic, Applied in sitting position Restrictions Weight Bearing Restrictions: No Pain: Pain Assessment Pain Scale: 0-10 Pain Score: 9  Pain Type: Acute pain Pain Location: Hand Pain Orientation: Right;Left Pain Radiating Towards: arms Pain Descriptors / Indicators: Aching;Discomfort Pain Frequency: Constant Pain Onset: On-going Pain Intervention(s): Medication (See eMAR) ADL: ADL Equipment Provided: Other (comment)(dorsal wrist cuff) Eating: Supervision/safety Where Assessed-Eating: Wheelchair Grooming: Not assessed Where Assessed-Grooming: Bed level Upper Body Bathing: Moderate assistance Where Assessed-Upper Body Bathing: Bed level Lower Body Bathing: Other (comment), Maximal assistance(2 helpers) Where Assessed-Lower Body Bathing: Bed level Upper Body Dressing: Maximal assistance, Other (Comment)(2 helpers) Where Assessed-Upper Body Dressing: Bed level Lower Body Dressing: Maximal assistance, Other (Comment)(2 helpers) Where Assessed-Lower Body Dressing: Bed level Toileting: Not assessed Toilet Transfer: Not assessed Walk-In Shower Transfer: Not assessed     Therapy/Group: Individual Therapy  Lj Miyamoto A Shaana Acocella 12/30/2017, 12:53 PM

## 2017-12-31 ENCOUNTER — Inpatient Hospital Stay (HOSPITAL_COMMUNITY): Payer: Medicare HMO | Admitting: Physical Therapy

## 2017-12-31 ENCOUNTER — Inpatient Hospital Stay (HOSPITAL_COMMUNITY): Payer: Medicare HMO

## 2017-12-31 ENCOUNTER — Inpatient Hospital Stay (HOSPITAL_COMMUNITY): Payer: Medicare HMO | Admitting: Occupational Therapy

## 2017-12-31 DIAGNOSIS — I82409 Acute embolism and thrombosis of unspecified deep veins of unspecified lower extremity: Secondary | ICD-10-CM

## 2017-12-31 NOTE — Progress Notes (Signed)
Physical Therapy Weekly Progress Note  Patient Details  Name: Sherry [name] MRN: [id number] Date of Birth: Jun 05, 1934  Beginning of progress report period: December 23, 2017 End of progress report period: December 31, 2017  Today's Date: 12/31/2017 PT Individual Time: 1345-1430 PT Individual Time Calculation (min): 45 min   Patient has met 3 of 3 short term goals.  Pt continues to make progress with therapy goals. Pt has initiate standing in stand frame and is able to tolerate standing up to 10 min. Pt continues to require max A for bed mobility and has been able to complete some sliding board transfers bed to/from w/c with max to total A x 1, decreasing need for assist x 2 for mobility. Pt continues to be a Supervision level for power w/c mobility and is demonstrating improved skill with navigating tight spaces and parking chair. Pt has progressed to needing min A 50% of the time with sitting balance and exhibits improved trunk control with functional tasks.  Patient continues to demonstrate the following deficits muscle weakness, abnormal tone, unbalanced muscle activation and decreased coordination and decreased sitting balance, decreased standing balance, decreased postural control and decreased balance strategies and therefore will continue to benefit from skilled PT intervention to increase functional independence with mobility. The following week's sessions will focus on family training to prepare patient for d/c home in the coming weeks.  Patient progressing toward long term goals..  Continue plan of care.  PT Short Term Goals Week 3:  PT Short Term Goal 1 (Week 3): Pt will utilize hooking to assist with maintaining side lying in bed in 75% of observations without cues.  PT Short Term Goal 1 - Progress (Week 3): Met PT Short Term Goal 2 (Week 3): Pt will initiate static standing with lift equipment x2 minutes PT Short Term Goal 2 - Progress (Week 3): Met PT Short Term Goal 3 (Week  3): Pt will maintain static sitting balance with min assist in 50% of observations PT Short Term Goal 3 - Progress (Week 3): Met Week 4:  PT Short Term Goal 1 (Week 4): Pt will maintain static sitting balance with close SBA in 50% of observations PT Short Term Goal 2 (Week 4): Pt will complete least restrictive transfer with assist x 1 in 75% of observations PT Short Term Goal 3 (Week 4): Pt will complete bed mobility with assist x 1 consistently  Skilled Therapeutic Interventions/Progress Updates:  Pt received seated in bed, agreeable to PT. No complaints of pain. Rolling L/R with max A and use of multimodal cueing and bedrails for dependent brief change and pericare. Dependent to don TED hose and sweatpants supine in bed. Supine to sit with max A for BLE management and trunk control with HOB elevated. Sitting balance EOB close SBA to min A while dependently donning shoes and TLSO as well as changing shirt. Sliding board transfer bed to w/c with total A x 1. Power w/c propulsion x 150 ft with Supervision navigating through obstacles and tight spaces. Pt is able to back w/c into space between sink and bed in room with mod verbal cueing for safety. Pt left reclined in power w/c in room with needs in reach.  Therapy Documentation Precautions:  Precautions Precautions: Cervical, Back, Fall Precaution Comments: back precautions due to thoracic fx s/p ACDF; needs TLSO for all OOB activity but per order set does not need cervical collar  Required Braces or Orthoses: Spinal Brace Spinal Brace: Thoracolumbosacral orthotic, Applied in sitting position Restrictions  Weight Bearing Restrictions: No   Therapy/Group: Individual Therapy   Excell Seltzer, PT, DPT 12/31/2017, 3:49 PM

## 2017-12-31 NOTE — Progress Notes (Signed)
Patient ID: Sherry [name], female   DOB: September 13, 1934, 83 y.o.   MRN: [id number]   Sherry [name] is a 83 y.o. 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  She is status post C5-6 and C6-7 fusion 1 month ago  Past Medical History:  Diagnosis Date  . Arthritis   . Cancer ([place])    skin  . Hypertension      Subjective: No new complaints. No new problems.  Complains that she is receiving her daily morning laxative too late that interferes with physical therapy  Objective: Vital signs in last 24 hours: Temp:  [98.3 F (36.8 C)-98.8 F (37.1 C)] 98.8 F (37.1 C) (11/30 0430) Pulse Rate:  [83-91] 83 (11/30 0430) Resp:  [18-20] 18 (11/30 0430) BP: (120-142)/(64-72) 120/64 (11/30 0430) SpO2:  [92 %-93 %] 92 % (11/30 0430) Weight:  [79.2 kg] 79.2 kg (11/30 0600) Weight change:  Last BM Date: 12/30/17  Intake/Output from previous day: 11/29 0701 - 11/30 0700 In: 720 [P.O.:720] Out: 2000 [Urine:2000]   Patient Vitals for the past 24 hrs:  BP Temp Pulse Resp SpO2 Weight  12/31/17 0600 - - - - - 79.2 kg  12/31/17 0430 120/64 98.8 F (37.1 C) 83 18 92 % -  12/30/17 2127 (!) 142/72 98.3 F (36.8 C) 91 20 93 % -     Physical Exam General: No apparent distress   HEENT: not dry Lungs: Normal effort. Lungs clear to auscultation, no crackles or wheezes. Cardiovascular: Regular rate and rhythm, no edema Abdomen: S/NT/ND; BS(+) Musculoskeletal:  unchanged Neurological: No new neurological deficits with incomplete tetraparesis Wounds: Left medial ankle and heel wrapped Skin: clear   Mental state: Alert, oriented, cooperative    Lab Results: BMET    Component Value Date/Time   NA 140 12/22/2017 0424   K 3.7 12/22/2017 0424   CL 109 12/22/2017 0424   CO2 26 12/22/2017 0424   GLUCOSE 113 (H) 12/22/2017 0424   BUN 13 12/22/2017 0424   CREATININE 0.69 12/22/2017 0424   CALCIUM 8.2 (L)  12/22/2017 0424   GFRNONAA >60 12/22/2017 0424   GFRAA >60 12/22/2017 0424   CBC    Component Value Date/Time   WBC 10.4 12/30/2017 0636   RBC 3.82 (L) 12/30/2017 0636   HGB 10.6 (L) 12/30/2017 0636   HCT 34.4 (L) 12/30/2017 0636   PLT 324 12/30/2017 0636   MCV 90.1 12/30/2017 0636   MCH 27.7 12/30/2017 0636   MCHC 30.8 12/30/2017 0636   RDW 15.7 (H) 12/30/2017 0636   LYMPHSABS 1.4 12/08/2017 0525   MONOABS 0.7 12/08/2017 0525   EOSABS 0.0 12/08/2017 0525   BASOSABS 0.0 12/08/2017 0525     Medications: I have reviewed the patient's current medications.  Assessment/Plan:  Functional deficits secondary to C6 spinal cord injury with incomplete tetraparesis.  Status post anterior cervical discectomy and fusion DVT prophylaxis/anticoagulation.   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  Well controlled   Length of stay, days: 24  Marletta Lor , MD 12/31/2017, 9:18 AM

## 2017-12-31 NOTE — Progress Notes (Signed)
Occupational Therapy Session Note  Patient Details  Name: Sherry [name] MRN: [id number] Date of Birth: [birth year]  Today's Date: 12/31/2017 OT Individual Time: 1130-1155 OT Individual Time Calculation (min): 25 min    Short Term Goals: Week 3:  OT Short Term Goal 1 (Week 3): Pt will perform static sitting balance on EOB for 5 minutes with max A or less. OT Short Term Goal 2 (Week 3): Pt will perform UB dressing with mod A.  Skilled Therapeutic Interventions/Progress Updates:    Session focused on self feeding. Pt donned R wrist cock up splint, with good caregiver instructions, mod A overall. Fork was inserted into splint to act as U-cuff. Pt set up with drinks and containers opened and with occasional cueing pt able to feed self entire meal. Pt left supine with all needs met. Pain as described below.   Therapy Documentation Precautions:  Precautions Precautions: Cervical, Back, Fall Precaution Comments: back precautions due to thoracic fx s/p ACDF; needs TLSO for all OOB activity but per order set does not need cervical collar  Required Braces or Orthoses: Spinal Brace Spinal Brace: Thoracolumbosacral orthotic, Applied in sitting position Restrictions Weight Bearing Restrictions: No    Pain: Pain Assessment Pain Scale: 0-10 Pain Score: 8  Pain Type: Acute pain Pain Location: Hand Pain Orientation: Right;Left Pain Descriptors / Indicators: Aching Pain Frequency: Constant Pain Onset: On-going Pain Intervention(s): Medication (See eMAR)  Therapy/Group: Individual Therapy  Curtis Sites 12/31/2017, 12:49 PM

## 2017-12-31 NOTE — Progress Notes (Signed)
Occupational Therapy Session Note  Patient Details  Name: Sherry [name] MRN: [id number] Date of Birth: [birth year]  Today's Date: 12/31/2017 OT Individual Time: 0906-1003 OT Individual Time Calculation (min): 57 min   Short Term Goals: Week 3:  OT Short Term Goal 1 (Week 3): Pt will perform static sitting balance on EOB for 5 minutes with max A or less. OT Short Term Goal 2 (Week 3): Pt will perform UB dressing with mod A.  Skilled Therapeutic Interventions/Progress Updates:    Pt greeted in bed, reported she had suppository this AM and had not yet been on bedpan. Positioned pt on bedpan with 2 assist for rolling/positioning. Pt with improved hand placement on bedrails to assist with rolling to Rt! Placed pt in chair position and utilized intervention strategies to promote increased parasympathetic activity/gastric motility: OT provided pt with k-pad for hands, and lavender to use via inhalation, also provided gentle tummy massage and instructed her through deep breathing exercises. Educated pt to use relaxation techniques for bowel program post d/c. Also discussed cathing plan post d/c with family assist. Pt reports she closes eyes and does not take note of nursing setup during caths at CIR. Encouraged pt to observe sequence, take note of positioning, and ask appropriate questions in prep for directing cath care post d/c. At end of session pt was checked for BM via rolling in manner as written above. Smearing noted on bedpan with pt actively having BM. Pt left on bedpan to continue with BM. RN made aware. Pt was left with all needs within reach and B PRAFO boots donned.  Therapy Documentation Precautions:  Precautions Precautions: Cervical, Back, Fall Precaution Comments: back precautions due to thoracic fx s/p ACDF; needs TLSO for all OOB activity but per order set does not need cervical collar  Required Braces or Orthoses: Spinal Brace Spinal Brace: Thoracolumbosacral orthotic, Applied in  sitting position Restrictions Weight Bearing Restrictions: No   Pain: Pt reported being premedicated at start of session  Pain Assessment Pain Scale: 0-10 Pain Score: 8  Pain Type: Acute pain Pain Location: Arm Pain Orientation: Right;Left Pain Descriptors / Indicators: Aching Pain Frequency: Constant Pain Onset: On-going Patients Stated Pain Goal: 3 Pain Intervention(s): Emotional support ADL: ADL Equipment Provided: Other (comment)(dorsal wrist cuff) Eating: Supervision/safety Where Assessed-Eating: Wheelchair Grooming: Not assessed Where Assessed-Grooming: Bed level Upper Body Bathing: Moderate assistance Where Assessed-Upper Body Bathing: Bed level Lower Body Bathing: Other (comment), Maximal assistance(2 helpers) Where Assessed-Lower Body Bathing: Bed level Upper Body Dressing: Maximal assistance, Other (Comment)(2 helpers) Where Assessed-Upper Body Dressing: Bed level Lower Body Dressing: Maximal assistance, Other (Comment)(2 helpers) Where Assessed-Lower Body Dressing: Bed level Toileting: Not assessed Toilet Transfer: Not assessed Walk-In Shower Transfer: Not assessed      Therapy/Group: Individual Therapy  Sherry [name] A Salmaan Patchin 12/31/2017, 12:12 PM

## 2018-01-01 ENCOUNTER — Inpatient Hospital Stay (HOSPITAL_COMMUNITY): Payer: Medicare HMO

## 2018-01-01 ENCOUNTER — Inpatient Hospital Stay (HOSPITAL_COMMUNITY): Payer: Medicare HMO | Admitting: Occupational Therapy

## 2018-01-01 MED ORDER — GLYCERIN (LAXATIVE) 2.1 G RE SUPP
1.0000 | Freq: Every day | RECTAL | Status: DC
  Administered 2018-01-02: 1 via RECTAL
  Filled 2018-01-01 (×2): qty 1

## 2018-01-01 MED ORDER — ALUM & MAG HYDROXIDE-SIMETH 200-200-20 MG/5ML PO SUSP
30.0000 mL | Freq: Four times a day (QID) | ORAL | Status: DC | PRN
  Administered 2018-01-07 – 2018-01-15 (×4): 30 mL via ORAL
  Filled 2018-01-01 (×4): qty 30

## 2018-01-01 NOTE — Progress Notes (Signed)
Patient ID: Sherry [name], female   DOB: 12-22-1934, 83 y.o.   MRN: [id number]   Sherry [name] is a 83 y.o. female who was admitted for CIR with functional deficits with incomplete tetraparesis secondary to cervical myelopathy.  She is status post cervical fusion  Past Medical History:  Diagnosis Date  . Arthritis   . Cancer ([place])    skin  . Hypertension      Subjective: No new complaints. No new problems.  Slept much better last night  Objective: Vital signs in last 24 hours: Temp:  [98.3 F (36.8 C)-99.5 F (37.5 C)] 99.5 F (37.5 C) (12/01 0514) Pulse Rate:  [81-96] 81 (12/01 0514) Resp:  [18] 18 (12/01 0514) BP: (113-132)/(57-73) 113/57 (12/01 0514) SpO2:  [93 %-96 %] 96 % (12/01 0514) Weight change:  Last BM Date: 12/31/17  Intake/Output from previous day: 11/30 0701 - 12/01 0700 In: 960 [P.O.:960] Out: 1600 [Urine:1600]   Patient Vitals for the past 24 hrs:  BP Temp Temp src Pulse Resp SpO2 Weight  01/01/18 0514 (!) 113/57 99.5 F (37.5 C) Oral 81 18 96 % -  12/31/17 2057 132/73 98.4 F (36.9 C) - 87 18 93 % -  12/31/17 1504 129/61 98.3 F (36.8 C) - 96 18 96 % -     Physical Exam General: No apparent distress   HEENT: Unremarkable Lungs: Normal effort. Lungs clear to auscultation, no crackles or wheezes. Cardiovascular: Regular rate and rhythm, no edema Abdomen: S/NT/ND; BS(+) Musculoskeletal:  unchanged Neurological: No new neurological deficits with incomplete tetraparesis Wounds: Bilateral heel pads in place Skin: clear   Mental state: Alert, oriented, cooperative    Lab Results: BMET    Component Value Date/Time   NA 140 12/22/2017 0424   K 3.7 12/22/2017 0424   CL 109 12/22/2017 0424   CO2 26 12/22/2017 0424   GLUCOSE 113 (H) 12/22/2017 0424   BUN 13 12/22/2017 0424   CREATININE 0.69 12/22/2017 0424   CALCIUM 8.2 (L) 12/22/2017 0424   GFRNONAA >60 12/22/2017 0424   GFRAA >60 12/22/2017 0424   CBC    Component Value  Date/Time   WBC 10.4 12/30/2017 0636   RBC 3.82 (L) 12/30/2017 0636   HGB 10.6 (L) 12/30/2017 0636   HCT 34.4 (L) 12/30/2017 0636   PLT 324 12/30/2017 0636   MCV 90.1 12/30/2017 0636   MCH 27.7 12/30/2017 0636   MCHC 30.8 12/30/2017 0636   RDW 15.7 (H) 12/30/2017 0636   LYMPHSABS 1.4 12/08/2017 0525   MONOABS 0.7 12/08/2017 0525   EOSABS 0.0 12/08/2017 0525   BASOSABS 0.0 12/08/2017 0525    Medications: I have reviewed the patient's current medications.  Assessment/Plan:  Status post anterior cervical discectomy and fusion with C6 spinal cord injury DVT.  Continue anticoagulation status post IVC filter Essential hypertension.  Blood pressure remains well controlled    Length of stay, days: 25  Marletta Lor , MD 01/01/2018, 9:22 AM

## 2018-01-01 NOTE — Progress Notes (Signed)
Occupational Therapy Session Note  Patient Details  Name: Sherry [name] MRN: [id number] Date of Birth: [birth year]  Today's Date: 01/01/2018 OT Individual Time: 1016-1100 OT Individual Time Calculation (min): 44 min   Short Term Goals: Week 3:  OT Short Term Goal 1 (Week 3): Pt will perform static sitting balance on EOB for 5 minutes with max A or less. OT Short Term Goal 2 (Week 3): Pt will perform UB dressing with mod A.  Skilled Therapeutic Interventions/Progress Updates:    Pt greeted in TIS, reporting significant buttocks pain and requesting to return to bed. 2 helpers for slideboard<EOB and transition to supine. Pt rolled Rt>Lt with Max A and facilitation for hand placement on bedrails. Examined buttocks, per pt request, with redness noted where brief was riding along Lt upper thigh. Notified RN, who advised for application of barrier cream, which OT completed during session. Educated pt on pressure relief positions in bed for preventing skin breakdown over bony prominences. 2 helpers for setting her up in sidelying using props, with pt completing deep belly breathing exercises with instruction. Educated Therapist, sports to continue with placing pt in sidelying for pressure relief throughout the day. She verbalized understanding. At end of session, pt wanted to remain in bed as pain had subsided a great deal. She was left with all needs within reach at session exit.    Therapy Documentation Precautions:  Precautions Precautions: Cervical, Back, Fall Precaution Comments: back precautions due to thoracic fx s/p ACDF; needs TLSO for all OOB activity but per order set does not need cervical collar  Required Braces or Orthoses: Spinal Brace Spinal Brace: Thoracolumbosacral orthotic, Applied in sitting position Restrictions Weight Bearing Restrictions: No Pain: In buttocks and hands. RN made aware. Pt repositioned in bed for comfort and provided k-pads for hands at end of tx Pain Assessment Pain  Scale: 0-10 Pain Score: 8  Pain Type: Acute pain Pain Location: Hand Pain Orientation: Right;Left Pain Radiating Towards: arms Pain Descriptors / Indicators: Aching Pain Frequency: Constant Pain Onset: On-going Patients Stated Pain Goal: 7 Pain Intervention(s): Medication (See eMAR) ADL: ADL Equipment Provided: Other (comment)(dorsal wrist cuff) Eating: Supervision/safety Where Assessed-Eating: Wheelchair Grooming: Not assessed Where Assessed-Grooming: Bed level Upper Body Bathing: Moderate assistance Where Assessed-Upper Body Bathing: Bed level Lower Body Bathing: Other (comment), Maximal assistance(2 helpers) Where Assessed-Lower Body Bathing: Bed level Upper Body Dressing: Maximal assistance, Other (Comment)(2 helpers) Where Assessed-Upper Body Dressing: Bed level Lower Body Dressing: Maximal assistance, Other (Comment)(2 helpers) Where Assessed-Lower Body Dressing: Bed level Toileting: Not assessed Toilet Transfer: Not assessed Walk-In Shower Transfer: Not assessed      Therapy/Group: Individual Therapy  Landra Howze A Anaka Beazer 01/01/2018, 12:35 PM

## 2018-01-01 NOTE — Progress Notes (Signed)
PRN Oxy IR given at 2042 and 0557 for C/O bilateral hand pain. At 0030 bladder scan=555, I & O cath=750cc's. At [place], bladder scan=168. Small incontinent BM. BLE edema. Bilateral hand splints and bilateral PRAFO boots applied.  Right foam dressing changed to right heel. And applied one to left heel-small scab noted. Slept good. Patrici Ranks A

## 2018-01-01 NOTE — Progress Notes (Signed)
Physical Therapy Session Note  Patient Details  Name: Sherry [name] MRN: [id number] Date of Birth: 16-Mar-1934  Today's Date: 01/01/2018 PT Individual Time: 0802-0900 PT Individual Time Calculation (min): 58 min   Short Term Goals: Week 4:  PT Short Term Goal 1 (Week 4): Pt will maintain static sitting balance with close SBA in 50% of observations PT Short Term Goal 2 (Week 4): Pt will complete least restrictive transfer with assist x 1 in 75% of observations PT Short Term Goal 3 (Week 4): Pt will complete bed mobility with assist x 1 consistently  Skilled Therapeutic Interventions/Progress Updates:    Pt supine in bed upon PT arrival, agreeable to therapy tx and reports pain 8/10 in hands and arms. Pt dependent for lower body dressing including pants, teds and shoes. Pt performed rolling with max assist in each direction using bed rails while therapist pulled pants over hips. Pt transferred from supine>sitting EOB with max assist, verbal cues for techniques. Pt maintained sitting balance with CGA while doffing gown and donning shirt, TLSO. Pt performed slideboard transfer this session with max assist, verbal cues for techniques and UE placement. Pt performed power w/c mobility from room>dayroom x 100 ft with supervision. Pt parks w/c at high table and requires set up assist for breakfast. Therapist dons pt's universal cuff to R UE to assist with eating, pt eats breakfast this session with set up/supervision working on UE coordination. Pt performs power w/c mobility back to room and left seated with needs in reach.   Therapy Documentation Precautions:  Precautions Precautions: Cervical, Back, Fall Precaution Comments: back precautions due to thoracic fx s/p ACDF; needs TLSO for all OOB activity but per order set does not need cervical collar  Required Braces or Orthoses: Spinal Brace Spinal Brace: Thoracolumbosacral orthotic, Applied in sitting position Restrictions Weight Bearing  Restrictions: No   Therapy/Group: Individual Therapy  Netta Corrigan, PT, DPT 01/01/2018, 7:48 AM

## 2018-01-02 ENCOUNTER — Inpatient Hospital Stay (HOSPITAL_COMMUNITY): Payer: Medicare HMO | Admitting: Occupational Therapy

## 2018-01-02 ENCOUNTER — Inpatient Hospital Stay (HOSPITAL_COMMUNITY): Payer: Medicare HMO | Admitting: Physical Therapy

## 2018-01-02 NOTE — Progress Notes (Signed)
Physical Therapy Session Note  Patient Details  Name: Sherry [name] MRN: [id number] Date of Birth: 20-Dec-1934  Today's Date: 01/02/2018 PT Individual Time: 1015-1100 PT Individual Time Calculation (min): 45 min   Short Term Goals: Week 4:  PT Short Term Goal 1 (Week 4): Pt will maintain static sitting balance with close SBA in 50% of observations PT Short Term Goal 2 (Week 4): Pt will complete least restrictive transfer with assist x 1 in 75% of observations PT Short Term Goal 3 (Week 4): Pt will complete bed mobility with assist x 1 consistently  Skilled Therapeutic Interventions/Progress Updates: Pt received in w/c, denies pain but does report likely need to be scanned/I&O cath. Discussed with NT and pt ok to wait until noon however d/t sensation of feeling full staff agreeable to perform now. Therapist transferred pt to bed with slideboard and maxA +1. MaxA sit >supine. Missed 15 min PT d/t nursing care. Returned to sitting maxA with HOB elevated. Sit>stand in standing frame, x10 min total with no signs of orthostatic hypotension. Returned to supine maxA. Obtained new trial w/c with gel and placed in w/c; alerted NT to pt request to get back in w/c. Remained in bed, all needs in reach.      Therapy Documentation Precautions:  Precautions Precautions: Cervical, Back, Fall Precaution Comments: back precautions due to thoracic fx s/p ACDF; needs TLSO for all OOB activity but per order set does not need cervical collar  Required Braces or Orthoses: Spinal Brace Spinal Brace: Thoracolumbosacral orthotic, Applied in sitting position Restrictions Weight Bearing Restrictions: No General: PT Amount of Missed Time (min): 15 Minutes PT Missed Treatment Reason: Nursing care    Therapy/Group: Individual Therapy  Corliss Skains 01/02/2018, 11:38 AM

## 2018-01-02 NOTE — Progress Notes (Signed)
Occupational Therapy Session Note  Patient Details  Name: Sherry [name] MRN: [id number] Date of Birth: November 14, 1934  Today's Date: 01/02/2018 OT Individual Time: 8299-3716 OT Individual Time Calculation (min): 72 min   Skilled Therapeutic Interventions/Progress Updates:    Pt greeted in bed, just finishing up lunch. 2 helpers for supine<sit and for donning TLSO EOB. Max A slideboard<PWC with additional helper stabilizing equipment. 2 assist for repositioning pt in [place]. Afterwards pt self propelled to dayroom. For remainder of session worked on [place] via yoga stretches. Education emphasis placed on breathing with primary vs accessory respiratory muscles. Pt required manual assist for all LE poses. Assist also required for shoulder abduction AROM against gravity, as pt drifted into scaption. At end of session pt propelled PWC back to room and tilted herself back with min vcs. Left pt with all needs within reach.    Therapy Documentation Precautions:  Precautions Precautions: Cervical, Back, Fall Precaution Comments: back precautions due to thoracic fx s/p ACDF; needs TLSO for all OOB activity but per order set does not need cervical collar  Required Braces or Orthoses: Spinal Brace Spinal Brace: Thoracolumbosacral orthotic, Applied in sitting position Restrictions Weight Bearing Restrictions: No Vital Signs: Therapy Vitals Temp: 98.6 F (37 C) Pulse Rate: 89 BP: 134/67 Patient Position (if appropriate): Sitting Oxygen Therapy SpO2: 94 % O2 Device: Room Air Pain: In shoulders, arms and hands. Provided k-pad to hands for relief. Also provided pt with gentle shoulder massage at start of tx with reported relief. RN made aware of pts request for pain medicine at end of tx   ADL: ADL Equipment Provided: Other (comment)(dorsal wrist cuff) Eating: Supervision/safety Where Assessed-Eating: Wheelchair Grooming: Not assessed Where Assessed-Grooming: Bed level Upper Body Bathing:  Moderate assistance Where Assessed-Upper Body Bathing: Bed level Lower Body Bathing: Other (comment), Maximal assistance(2 helpers) Where Assessed-Lower Body Bathing: Bed level Upper Body Dressing: Maximal assistance, Other (Comment)(2 helpers) Where Assessed-Upper Body Dressing: Bed level Lower Body Dressing: Maximal assistance, Other (Comment)(2 helpers) Where Assessed-Lower Body Dressing: Bed level Toileting: Not assessed Toilet Transfer: Not assessed Walk-In Shower Transfer: Not assessed      Therapy/Group: Individual Therapy  Sherry [name] 01/02/2018, 4:43 PM

## 2018-01-02 NOTE — Progress Notes (Signed)
Occupational Therapy Session Note  Patient Details  Name: Sherry [name] MRN: [id number] Date of Birth: October 05, 1934  Today's Date: 01/02/2018 OT Individual Time: 9211-9417 OT Individual Time Calculation (min): 75 min    Short Term Goals: Week 3:  OT Short Term Goal 1 (Week 3): Pt will perform static sitting balance on EOB for 5 minutes with max A or less. OT Short Term Goal 2 (Week 3): Pt will perform UB dressing with mod A.  Skilled Therapeutic Interventions/Progress Updates:    Upon entering the room, pt seated in bed with universal cuff on R UE to feed self breakfast with set up A to open and cut up food. Pt rolling L <> R with max A and total A to don B TEDs and pants. Supine >sit with total A of 1 to EOB. Static sitting balance 3 minutes with mod - max A for balance. Pt leaning to the R to place slide board with min A to return to midline with use of R UE. Slide board from bed >power chair with total A of 1. Pt utilized controls to move chair to sink for grooming tasks with use of u cuff to brush teeth with set up. OT assisting pt with hand hygiene at sink for thoroughness. Pt reporting 9/10 pain in B hands and RN notified with medication given this session. OT placed k pad over B hands upon exiting the room. Pt tilting chair back for comfort and pressure relief.  Call bell and all needed items within reach upon exiting the room.   Therapy Documentation Precautions:  Precautions Precautions: Cervical, Back, Fall Precaution Comments: back precautions due to thoracic fx s/p ACDF; needs TLSO for all OOB activity but per order set does not need cervical collar  Required Braces or Orthoses: Spinal Brace Spinal Brace: Thoracolumbosacral orthotic, Applied in sitting position Restrictions Weight Bearing Restrictions: No ADL: ADL Equipment Provided: Other (comment)(dorsal wrist cuff) Eating: Supervision/safety Where Assessed-Eating: Wheelchair Grooming: Not assessed Where  Assessed-Grooming: Bed level Upper Body Bathing: Moderate assistance Where Assessed-Upper Body Bathing: Bed level Lower Body Bathing: Other (comment), Maximal assistance(2 helpers) Where Assessed-Lower Body Bathing: Bed level Upper Body Dressing: Maximal assistance, Other (Comment)(2 helpers) Where Assessed-Upper Body Dressing: Bed level Lower Body Dressing: Maximal assistance, Other (Comment)(2 helpers) Where Assessed-Lower Body Dressing: Bed level Toileting: Not assessed Toilet Transfer: Not assessed Walk-In Shower Transfer: Not assessed   Therapy/Group: Individual Therapy  [name] 01/02/2018, 9:33 AM

## 2018-01-02 NOTE — Progress Notes (Signed)
At 2300, I & O cath=400cc's. Large amount of sediment, malodorous. At 0400, bladder scan=679, I & O cath=700cc's. Small incontinent stool. Supp given. Dig stim with large, mushy stool. Placed patient on bedpan. Oxy Ir 5mg  given twice on my shift. C/O of BUE pain. Patrici Ranks A

## 2018-01-02 NOTE — Progress Notes (Signed)
[place] PHYSICAL MEDICINE & REHABILITATION PROGRESS NOTE   Subjective/Complaints: Had pain yesterday during a transfer where she felt like she was being "torn apart". Sensitive to wrinkle in seat also. Pain seems controlled this morning.   ROS: Patient denies fever, rash, sore throat, blurred vision, nausea, vomiting, diarrhea, cough, shortness of breath or chest pain,   headache, or mood change.    Objective:   No results found. No results for input(s): WBC, HGB, HCT, PLT in the last 72 hours. No results for input(s): NA, K, CL, CO2, GLUCOSE, BUN, CREATININE, CALCIUM in the last 72 hours.  Intake/Output Summary (Last 24 hours) at 01/02/2018 0853 Last data filed at 01/02/2018 0745 Gross per 24 hour  Intake 1860 ml  Output 2250 ml  Net -390 ml     Physical Exam: Vital Signs Blood pressure (!) 121/59, pulse 92, temperature 99.2 F (37.3 C), resp. rate 18, height 5\' 7"  (1.702 m), weight 79.2 kg, SpO2 91 %. Constitutional: No distress . Vital signs reviewed. HEENT: EOMI, oral membranes moist Neck: supple Cardiovascular: RRR without murmur. No JVD    Respiratory: CTA Bilaterally without wheezes or rales. Normal effort    GI: BS +, non-tender, non-distended   Musculoskeletal: Ongoing Mild edema distally in LE's and UE. No pain to palp or with UE ROM Neurological: Alert and oriented. Sensation diminished to light touch below T4, sl sense of pain Motor: RUE: Shoulder abduction, elbow flex 3+/5, elbow extension, wrist extension 1+ to 2-/5, hand gip 0/5--trace  Finger flexion in all 4 digits on left and index finger on R---stable LUE: Shoulder abduction, elbow flexion 3-/5, wrist extension 2/5, hand gip 0/5--stable R LE: 1+/5 proximal to distal, LLE 2/5 prox to 2/5 distally-stable exam.  -no resting tone Skin: Incision c/d/i, . Psychiatric:pleasant, no lability or agitation  Assessment/Plan: 1. Functional deficits secondary to C6 SCI, motor/sensory incomplete which require 3+ hours  per day of interdisciplinary therapy in a comprehensive inpatient rehab setting.  Physiatrist is providing close team supervision and 24 hour management of active medical problems listed below.  Physiatrist and rehab team continue to assess barriers to discharge/monitor patient progress toward functional and medical goals  Care Tool:  Bathing  Bathing activity did not occur: Refused Body parts bathed by patient: Right arm, Left arm, Chest, Left upper leg, Right upper leg, Abdomen   Body parts bathed by helper: Front perineal area, Buttocks, Right lower leg, Left lower leg, Face Body parts n/a: Face, Front perineal area, Buttocks(previously washed )   Bathing assist Assist Level: 2 Helpers     Upper Body Dressing/Undressing Upper body dressing   What is the patient wearing?: Pull over shirt    Upper body assist Assist Level: Maximal Assistance - Patient 25 - 49%    Lower Body Dressing/Undressing Lower body dressing      What is the patient wearing?: Pants     Lower body assist Assist for lower body dressing: 2 Helpers     Toileting Toileting    Toileting assist Assist for toileting: Dependent - Patient 0%     Transfers Chair/bed transfer  Transfers assist     Chair/bed transfer assist level: Maximal Assistance - Patient 25 - 49%     Locomotion Ambulation   Ambulation assist   Ambulation activity did not occur: Safety/medical concerns          Walk 10 feet activity   Assist  Walk 10 feet activity did not occur: Safety/medical concerns  Walk 50 feet activity   Assist Walk 50 feet with 2 turns activity did not occur: Safety/medical concerns         Walk 150 feet activity   Assist Walk 150 feet activity did not occur: Safety/medical concerns         Walk 10 feet on uneven surface  activity   Assist Walk 10 feet on uneven surfaces activity did not occur: Safety/medical concerns         Wheelchair     Assist Will  patient use wheelchair at discharge?: Yes Type of Wheelchair: Power Wheelchair activity did not occur: Safety/medical concerns  Wheelchair assist level: Supervision/Verbal cueing Max wheelchair distance: 100 ft    Wheelchair 50 feet with 2 turns activity    Assist    Wheelchair 50 feet with 2 turns activity did not occur: Safety/medical concerns   Assist Level: Supervision/Verbal cueing   Wheelchair 150 feet activity     Assist Wheelchair 150 feet activity did not occur: Safety/medical concerns   Assist Level: Supervision/Verbal cueing     Medical Problem List and Plan: 1.  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  Status post C5-6 and C6-7 anterior cervical discectomy and fusion 11/29/2017.  TLSO back brace when out of bed     -Continue CIR therapies including PT, OT  .      2.  DVT Prophylaxis/Anticoagulation:  Acute mobile DVT Right common/proximal femoral vein  -IVCF placed by radiology  3. Pain Management:   Neurontin  400mg  qid with improvement and not oversedated      oxycodone as needed for pain  -will monitor pain levels. Observe for acute exacerbations today.   -seems that pain has been under reasonable control until episode yesterday 4. Mood:   -ongoing ego support, positive reinforcement  -neuropsych   5. Neuropsych: This patient is capable of making decisions on her own behalf. 6. Skin/Wound Care: Routine skin checks  - elevate limbs when possible  -sutures removed from left elbow  -better nutrition will also help edema mgt 7. Fluids/Electrolytes/Nutrition: encourage PO  -replacing potassium  - protein for hypoalbuminemia        8.  Neurogenic bowel and bladder.  Flomax 0.4 mg daily.    -I/O caths    -AM bowel program, senna-s + HS, AM suppository.      -  senna-s to just senna at bedtime   -fibercon started for loose stool   -bowel program should be at 6am. Stool very formed this morning       glycerin supp qam   9.  Hypertension.  Norvasc 10 mg daily.   Vitals:   01/02/18 0354 01/02/18 0415  BP: (!) 121/59   Pulse: 92   Resp: 18   Temp: 99.2 F (37.3 C)   SpO2: (!) 88% 91%   Controlled on 12/2 10. Acute blood loss anemia:     -hgb 10.9  11/11--->10/6 11/29  -no signs of blood loss  -improving nutritional status   LOS: 26 days A FACE TO FACE EVALUATION WAS PERFORMED  Meredith Staggers 01/02/2018, 8:53 AM

## 2018-01-02 NOTE — Progress Notes (Signed)
Occupational Therapy Weekly Progress Note  Patient Details  Name: Sherry [name] MRN: [id number] Date of Birth: Jun 28, 1934  Beginning of progress report period: 12/26/17 End of progress report period: 01/02/18  Patient has met 0 of 2 short term goals.    Pt continues to make slow progress towards LTGs. She requires Max A-2 helpers for ADLs bedlevel/EOB and Total A for slideboard transfers. Static sitting balance has improved, and pt can now maintain balance EOB with Mod-Max A. She is also becoming more proficient with AE use for self feeding and grooming tasks, and can direct her own care regarding setup. Though limited by pain, pt continues to be motivated during sessions and exhibits high levels of participation during OT. Continue POC.   Patient continues to demonstrate the following deficits: muscle weakness, muscle joint tightness and muscle paralysis, decreased cardiorespiratoy endurance, abnormal tone, unbalanced muscle activation and decreased coordination and decreased sitting balance, decreased standing balance, decreased postural control, decreased balance strategies and difficulty maintaining precautions and therefore will continue to benefit from skilled OT intervention to enhance overall performance with BADL.  Patient progressing toward long term goals..  Continue plan of care.  OT Short Term Goals Week 3:  OT Short Term Goal 1 (Week 3): Pt will perform static sitting balance on EOB for 5 minutes with max A or less. OT Short Term Goal 1 - Progress (Week 3): Progressing toward goal OT Short Term Goal 2 (Week 3): Pt will perform UB dressing with mod A. OT Short Term Goal 2 - Progress (Week 3): Progressing toward goal Week 4: Short Term Goal 1 (Week 4):    STGs=LTGs due to ELOS      Therapy Documentation Precautions:  Precautions Precautions: Cervical, Back, Fall Precaution Comments: back precautions due to thoracic fx s/p ACDF; needs TLSO for all OOB activity but per order  set does not need cervical collar  Required Braces or Orthoses: Spinal Brace Spinal Brace: Thoracolumbosacral orthotic, Applied in sitting position Restrictions Weight Bearing Restrictions: No Vital Signs: Therapy Vitals Temp: 98.6 F (37 C) Pulse Rate: 89 BP: 134/67 Patient Position (if appropriate): Sitting Oxygen Therapy SpO2: 94 % O2 Device: Room Air ADL: ADL Equipment Provided: Other (comment)(dorsal wrist cuff) Eating: Supervision/safety Where Assessed-Eating: Wheelchair Grooming: Not assessed Where Assessed-Grooming: Bed level Upper Body Bathing: Moderate assistance Where Assessed-Upper Body Bathing: Bed level Lower Body Bathing: Other (comment), Maximal assistance(2 helpers) Where Assessed-Lower Body Bathing: Bed level Upper Body Dressing: Maximal assistance, Other (Comment)(2 helpers) Where Assessed-Upper Body Dressing: Bed level Lower Body Dressing: Maximal assistance, Other (Comment)(2 helpers) Where Assessed-Lower Body Dressing: Bed level Toileting: Not assessed Toilet Transfer: Not assessed Walk-In Shower Transfer: Not assessed     Therapy/Group: Individual Therapy  Mikeisha Lemonds A Saagar Tortorella 01/02/2018, 5:09 PM

## 2018-01-02 NOTE — Progress Notes (Signed)
Social Work Patient ID: Sherry [name], female   DOB: 02-04-1934, 83 y.o.   MRN: [id number]  Have sent medical records again to insurance this morning.  Contacted insurance CM to alert her to fax and that team is still targeting 12/11 d/c date.  Await review and report from insurance on coverage. Pt and family aware.  Alyssa Rotondo, LCSW

## 2018-01-03 ENCOUNTER — Inpatient Hospital Stay (HOSPITAL_COMMUNITY): Payer: Medicare HMO | Admitting: Physical Therapy

## 2018-01-03 ENCOUNTER — Inpatient Hospital Stay (HOSPITAL_COMMUNITY): Payer: Medicare HMO | Admitting: Occupational Therapy

## 2018-01-03 MED ORDER — GLYCERIN (LAXATIVE) 2.1 G RE SUPP
1.0000 | Freq: Every day | RECTAL | Status: DC
  Administered 2018-01-04 – 2018-01-15 (×11): 1 via RECTAL
  Filled 2018-01-03 (×14): qty 1

## 2018-01-03 NOTE — Progress Notes (Signed)
Occupational Therapy Session Note  Patient Details  Name: Sherry [name] MRN: [id number] Date of Birth: 11/11/1934  Today's Date: 01/03/2018 OT Individual Time: 3151-7616 and 1515 -1608 OT Individual Time Calculation (min): 70 min and 53 min   Short Term Goals: Week 4:  OT Short Term Goal 1 (Week 4): STGs=LTGs due to ELOS  Skilled Therapeutic Interventions/Progress Updates:    Session 1: Upon entering the room, pt in power wheelchair with heat over B hands. Pt requesting pain medication with pain reported at 9/10 pain in B UEs. Pt requesting to return to bed for session secondary to pain and medication not due until further in the session. Slide board transfer from power chair > bed with total A. Total A for sit >supine and second helper assisted with positioning in bed for comfort. Stretching to B LEs in all planes while in supine and pt performing stretching and UE exercises with min verbal cuing from therapist for proper technique. Focus on pursed lip breathing as well. RN provided pain medication and NT arrived to set up pt for lunch as therapist exited the room with call bell and all needed items within reach.   Session 2: Pt supine upon entering the room, with family members present. Pt agreeable to OT intervention. Supine >sit with total A to EOB. Static sitting balance at close supervision. Pt leaning onto R elbow for slide board placement and min A to return to midline. Total A of 1 slide board transfer from bed >power chair. Pt propelled power chair 1000'+ to main entrance and onto elevator with supervision and min verbal cuing for technique. Pt looking at [place] in main lobby and able to navigate safely around crowded area at main entrance. Pt returning herself in same manner back to room. Pt remaining in wheelchair with family present and call bell within reach.   Therapy Documentation Precautions:  Precautions Precautions: Cervical, Back, Fall Precaution Comments: back  precautions due to thoracic fx s/p ACDF; needs TLSO for all OOB activity but per order set does not need cervical collar  Required Braces or Orthoses: Spinal Brace Spinal Brace: Thoracolumbosacral orthotic, Applied in sitting position Restrictions Weight Bearing Restrictions: No ADL: ADL Equipment Provided: Other (comment)(dorsal wrist cuff) Eating: Supervision/safety Where Assessed-Eating: Wheelchair Grooming: Not assessed Where Assessed-Grooming: Bed level Upper Body Bathing: Moderate assistance Where Assessed-Upper Body Bathing: Bed level Lower Body Bathing: Other (comment), Maximal assistance(2 helpers) Where Assessed-Lower Body Bathing: Bed level Upper Body Dressing: Maximal assistance, Other (Comment)(2 helpers) Where Assessed-Upper Body Dressing: Bed level Lower Body Dressing: Maximal assistance, Other (Comment)(2 helpers) Where Assessed-Lower Body Dressing: Bed level Toileting: Not assessed Toilet Transfer: Not assessed Walk-In Shower Transfer: Not assessed   Therapy/Group: Individual Therapy  [name] 01/03/2018, 12:21 PM

## 2018-01-03 NOTE — Progress Notes (Signed)
[place] PHYSICAL MEDICINE & REHABILITATION PROGRESS NOTE   Subjective/Complaints: Had a better day with pain yesterday. Anxious re: band like sensation around chest. Bowel program again at 8am today!! Did have some sense of bladder filling  ROS: Patient denies fever, rash, sore throat, blurred vision, nausea, vomiting, diarrhea, cough, shortness of breath or chest pain,  headache, or mood change.     Objective:   No results found. No results for input(s): WBC, HGB, HCT, PLT in the last 72 hours. No results for input(s): NA, K, CL, CO2, GLUCOSE, BUN, CREATININE, CALCIUM in the last 72 hours.  Intake/Output Summary (Last 24 hours) at 01/03/2018 0847 Last data filed at 01/03/2018 0700 Gross per 24 hour  Intake 720 ml  Output 3201 ml  Net -2481 ml     Physical Exam: Vital Signs Blood pressure 122/68, pulse 83, temperature 98.4 F (36.9 C), temperature source Oral, resp. rate 18, height 5\' 7"  (1.702 m), weight 79.2 kg, SpO2 94 %. Constitutional: No distress . Vital signs reviewed. HEENT: EOMI, oral membranes moist Neck: supple Cardiovascular: RRR without murmur. No JVD    Respiratory: CTA Bilaterally without wheezes or rales. Normal effort    GI: BS +, non-tender, non-distended   Musculoskeletal: Mild edema distally in LE's and UE. No pain to palp or with UE ROM Neurological: Alert and oriented. Sensation diminished to light touch below T4, sl sense of pain Motor: RUE: Shoulder abduction, elbow flex 3+/5, elbow extension, wrist extension 1+ to 2-/5, hand gip 0/5--trace  Finger flexion in all 4 digits on left and index finger on R--no chagnges LUE: Shoulder abduction, elbow flexion 3-/5, wrist extension 2/5, hand gip 0/5- stable R LE: 1+/5 proximal to distal, LLE 2/5 prox to 2/5 distally-stable exam.  -no resting tone Skin: mild redness along sacrum (moisture related) . Psychiatric: anxious  Assessment/Plan: 1. Functional deficits secondary to C6 SCI, motor/sensory incomplete  which require 3+ hours per day of interdisciplinary therapy in a comprehensive inpatient rehab setting.  Physiatrist is providing close team supervision and 24 hour management of active medical problems listed below.  Physiatrist and rehab team continue to assess barriers to discharge/monitor patient progress toward functional and medical goals  Care Tool:  Bathing  Bathing activity did not occur: Refused Body parts bathed by patient: Right arm, Left arm, Chest, Left upper leg, Right upper leg, Abdomen   Body parts bathed by helper: Front perineal area, Buttocks, Right lower leg, Left lower leg, Face Body parts n/a: Face, Front perineal area, Buttocks(previously washed )   Bathing assist Assist Level: 2 Helpers     Upper Body Dressing/Undressing Upper body dressing   What is the patient wearing?: Pull over shirt    Upper body assist Assist Level: Maximal Assistance - Patient 25 - 49%    Lower Body Dressing/Undressing Lower body dressing      What is the patient wearing?: Pants     Lower body assist Assist for lower body dressing: 2 Helpers     Toileting Toileting    Toileting assist Assist for toileting: Dependent - Patient 0%     Transfers Chair/bed transfer  Transfers assist     Chair/bed transfer assist level: Maximal Assistance - Patient 25 - 49%     Locomotion Ambulation   Ambulation assist   Ambulation activity did not occur: Safety/medical concerns          Walk 10 feet activity   Assist  Walk 10 feet activity did not occur: Safety/medical concerns  Walk 50 feet activity   Assist Walk 50 feet with 2 turns activity did not occur: Safety/medical concerns         Walk 150 feet activity   Assist Walk 150 feet activity did not occur: Safety/medical concerns         Walk 10 feet on uneven surface  activity   Assist Walk 10 feet on uneven surfaces activity did not occur: Safety/medical concerns          Wheelchair     Assist Will patient use wheelchair at discharge?: Yes Type of Wheelchair: Power Wheelchair activity did not occur: Safety/medical concerns  Wheelchair assist level: Supervision/Verbal cueing Max wheelchair distance: 100 ft    Wheelchair 50 feet with 2 turns activity    Assist    Wheelchair 50 feet with 2 turns activity did not occur: Safety/medical concerns   Assist Level: Supervision/Verbal cueing   Wheelchair 150 feet activity     Assist Wheelchair 150 feet activity did not occur: Safety/medical concerns   Assist Level: Supervision/Verbal cueing     Medical Problem List and Plan: 1.  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  Status post C5-6 and C6-7 anterior cervical discectomy and fusion 11/29/2017.  TLSO back brace when out of bed     -Interdisciplinary Team Conference today    .      -reviewed sensory deficits with pt/daughter todeay 2.  DVT Prophylaxis/Anticoagulation:  Acute mobile DVT Right common/proximal femoral vein  -IVCF placed by radiology  3. Pain Management:   Neurontin  400mg  qid with improvement and not oversedated      oxycodone as needed for pain  -will monitor pain levels. Observe for acute exacerbations today.   -seems that pain has been under reasonable control until episode yesterday 4. Mood:   -ongoing ego support, positive reinforcement  -neuropsych   5. Neuropsych: This patient is capable of making decisions on her own behalf. 6. Skin/Wound Care: Routine skin checks  - continue to elevate limbs when possible   -better nutrition will also help edema mgt 7. Fluids/Electrolytes/Nutrition:    -replacing potassium  - protein for hypoalbuminemia   -recheck labs tomorrow---eating quite well now     8.  Neurogenic bowel and bladder.  Flomax 0.4 mg daily.    -I/O caths    -AM bowel program, senna-s + HS, AM suppository.      -  senna-s to just senna at  bedtime   -fibercon started for loose stool   -bowel program should be at 6am. Stool very formed this morning      glycerin supp qam  AT 6AM! 9.  Hypertension.  Norvasc 10 mg daily.   Vitals:   01/02/18 1509 01/03/18 0536  BP: 134/67 122/68  Pulse: 89 83  Resp:  18  Temp: 98.6 F (37 C) 98.4 F (36.9 C)  SpO2: 94% 94%   Controlled on 12/3 10. Acute blood loss anemia:     -hgb 10.9  11/11--->10/6 11/29  -no signs of blood loss  -improving nutritional status  -RECHECK tomorrow  LOS: 27 days A FACE TO FACE EVALUATION WAS PERFORMED  Meredith Staggers 01/03/2018, 8:47 AM

## 2018-01-03 NOTE — Progress Notes (Signed)
Physical Therapy Session Note  Patient Details  Name: Sherry [name] MRN: [id number] Date of Birth: [birth year]  Today's Date: 01/03/2018 PT Individual Time: 0900-1000 and 1400-1430 PT Individual Time Calculation (min): 60 min and 30 min (total 90 min)  Short Term Goals: Week 4:  PT Short Term Goal 1 (Week 4): Pt will maintain static sitting balance with close SBA in 50% of observations PT Short Term Goal 2 (Week 4): Pt will complete least restrictive transfer with assist x 1 in 75% of observations PT Short Term Goal 3 (Week 4): Pt will complete bed mobility with assist x 1 consistently  Skilled Therapeutic Interventions/Progress Updates: Tx 1: Pt received seated in bed, c/o pain with cold sensation in hands; pre-medicated per RN and not due for any pain medication at this time. Rolling R/L maxA with bedrails and assist for placing LE in hooklying position; pants donned totalA. Supine>sit maxA +2. Lateral scoot transfer with slight downhill to w/c with transfer board and maxA +1; pt demo's increasing push through LEs to assist with scooting. Transfer w/c >mat table maxA +2, to return to mat maxA +1. Seated balance on edge of mat performed lateral leans to R/L elbow; able to return to sitting with minA>S x4 trials each side, improving core activation and use of UEs to assist. Sit <>stand with three muskateers +2A; performed x3 reps mini-squats with activation noted BLE. Returned to w/c as above. Power w/c management to return to room with S, min cues for safety. Remained semi-reclined in w/c at end of session, all needs in reach.   Tx 2: Pt received supine in bed, denies pain and agreeable to treatment. Transferred in supine bed<>tilt table with totalA +2; pt assisted with bridging hips to scoot to R towards tilt table however unable to scoot upper body to meet lower. Standing tolerance at 30>40>55 degrees with no reports of orthostatic hypotension (wearing TEDs); each position held approx 2-3 min to  allow BP to accommodate. Returned to 15-20 degrees and performed 1 set 10 reps mini-squats; assisted to knee flexion, then pt able to activate quads/glutes to full extension. Returned to bed with lateral scoot as above. Remained in bed at end of session, handoff to OT, RN alerted to pt request to have I&O cath performed d/t full sensation.     Therapy Documentation Precautions:  Precautions Precautions: Cervical, Back, Fall Precaution Comments: back precautions due to thoracic fx s/p ACDF; needs TLSO for all OOB activity but per order set does not need cervical collar  Required Braces or Orthoses: Spinal Brace Spinal Brace: Thoracolumbosacral orthotic, Applied in sitting position Restrictions Weight Bearing Restrictions: No    Therapy/Group: Individual Therapy  Corliss Skains 01/03/2018, 10:21 AM

## 2018-01-03 NOTE — Progress Notes (Signed)
Nutrition Follow-up  INTERVENTION:   - Continue MVI with minerals daily  - Continue Pro-stat 30 ml BID, each supplement provides 100 kcal and 15 grams of protein  - Continue afternoon snack daily  NUTRITION DIAGNOSIS:   Inadequate oral intake related to poor appetite, early satiety as evidenced by per patient/family report.  Progressing  GOAL:   Patient will meet greater than or equal to 90% of their needs  Met  MONITOR:   PO intake, Labs, Skin, I & O's, Weight trends  REASON FOR ASSESSMENT:   Consult Poor PO  ASSESSMENT:   83 year old female with PMH significant for hypertension. Pt presented on 11/29/2017 after a fall with immediate quadriparesis. Cranial CT scan unremarkable for acute intracranial process. CT/MRI imaging showed C6-7 disc fracture and tear of anterior longitudinal ligament. Paraspinal muscle and interspinous edema from C1-C7 compatible ligament injury and muscle strain. Increased cord signal at C5 and C6. T11 coronal vertebral body fracture. Pt underwent C5-6, C6-7 anterior cervical discectomy and fusion on 11/29/2017 per Dr. Venetia Constable.  11/7 - s/p IVC filter placement  Weight stable since 11/20.  Pt preparing to eat lunch at time of visit and reports having a great appetite. RD assisted NT in setting pt up for lunch and getting shake brought from home for pt. Pt continues to drink 1 cup of homemade shake TID along with Pro-stat as ordered.  Pt in good spirits and denies any nutrition-related complaints. Will continue with current nutrition plan of care.  Meal Completion: 75-100%  Medications reviewed and include: Pro-stat 30 ml BID, liquid MVI daily, Fibercon, K-dur 20 mEq daily  Labs reviewed.  UOP: 3200 ml x 24 hours  Diet Order:   Diet Order            Diet regular Room service appropriate? Yes; Fluid consistency: Thin  Diet effective now              EDUCATION NEEDS:   Education needs have been addressed  Skin:  Skin  Assessment: Skin Integrity Issues: DTI: right heel Incisions: left groin, neck Other: laceration to left elbow  Last BM:  12/3 (medium type 6)  Height:   Ht Readings from Last 1 Encounters:  12/22/17 '5\' 7"'  (1.702 m)    Weight:   Wt Readings from Last 1 Encounters:  12/31/17 79.2 kg    Ideal Body Weight:  61.36 kg  BMI:  Body mass index is 27.35 kg/m.  Estimated Nutritional Needs:   Kcal:  1500-1700  Protein:  80-95 grams  Fluid:  1.5-1.7 L    Gaynell Face, MS, RD, LDN Inpatient Clinical Dietitian Pager: [phone] Weekend/After Hours: [phone]

## 2018-01-04 ENCOUNTER — Inpatient Hospital Stay (HOSPITAL_COMMUNITY): Payer: Medicare HMO | Admitting: Physical Therapy

## 2018-01-04 ENCOUNTER — Encounter (HOSPITAL_COMMUNITY): Payer: Medicare HMO | Admitting: Psychology

## 2018-01-04 ENCOUNTER — Inpatient Hospital Stay (HOSPITAL_COMMUNITY): Payer: Medicare HMO | Admitting: Occupational Therapy

## 2018-01-04 LAB — CBC
HCT: 34.1 % — ABNORMAL LOW (ref 36.0–46.0)
HEMOGLOBIN: 11 g/dL — AB (ref 12.0–15.0)
MCH: 28.3 pg (ref 26.0–34.0)
MCHC: 32.3 g/dL (ref 30.0–36.0)
MCV: 87.7 fL (ref 80.0–100.0)
NRBC: 0 % (ref 0.0–0.2)
PLATELETS: 357 10*3/uL (ref 150–400)
RBC: 3.89 MIL/uL (ref 3.87–5.11)
RDW: 15.3 % (ref 11.5–15.5)
WBC: 8.2 10*3/uL (ref 4.0–10.5)

## 2018-01-04 LAB — BASIC METABOLIC PANEL
ANION GAP: 9 (ref 5–15)
BUN: 16 mg/dL (ref 8–23)
CALCIUM: 8.2 mg/dL — AB (ref 8.9–10.3)
CO2: 26 mmol/L (ref 22–32)
Chloride: 101 mmol/L (ref 98–111)
Creatinine, Ser: 0.42 mg/dL — ABNORMAL LOW (ref 0.44–1.00)
Glucose, Bld: 98 mg/dL (ref 70–99)
POTASSIUM: 4 mmol/L (ref 3.5–5.1)
Sodium: 136 mmol/L (ref 135–145)

## 2018-01-04 MED ORDER — BACLOFEN 5 MG HALF TABLET
5.0000 mg | ORAL_TABLET | Freq: Three times a day (TID) | ORAL | Status: DC
  Administered 2018-01-04 – 2018-01-08 (×15): 5 mg via ORAL
  Filled 2018-01-04 (×15): qty 1

## 2018-01-04 NOTE — Progress Notes (Signed)
[facility] PHYSICAL MEDICINE & REHABILITATION PROGRESS NOTE   Subjective/Complaints: Complains of cramping/spasms in abdomen and legs. Worked really hard in therapies yesterday. Appetite much improved  ROS: Patient denies fever, rash, sore throat, blurred vision, nausea, vomiting, diarrhea, cough, shortness of breath or chest pain,  headache, or mood change.    Objective:   No results found. Recent Labs    01/04/18 0450  WBC 8.2  HGB 11.0*  HCT 34.1*  PLT 357   Recent Labs    01/04/18 0450  NA 136  K 4.0  CL 101  CO2 26  GLUCOSE 98  BUN 16  CREATININE 0.42*  CALCIUM 8.2*    Intake/Output Summary (Last 24 hours) at 01/04/2018 0916 Last data filed at 01/04/2018 0817 Gross per 24 hour  Intake 900 ml  Output 1402 ml  Net -502 ml     Physical Exam: Vital Signs Blood pressure 130/60, pulse 96, temperature 99.2 F (37.3 C), temperature source Oral, resp. rate 16, height 5\' 7"  (1.702 m), weight 79.2 kg, SpO2 92 %. Constitutional: No distress . Vital signs reviewed. HEENT: EOMI, oral membranes moist Neck: supple Cardiovascular: RRR without murmur. No JVD    Respiratory: CTA Bilaterally without wheezes or rales. Normal effort    GI: BS +, non-tender, non-distended   Musculoskeletal: minimal UE edema, 1++ LE edema, left more than right    Neurological: Alert and oriented. Sensation diminished to light touch below T4, sl sense of pain Motor: RUE: Shoulder abduction, elbow flex 3+/5, elbow extension, wrist extension 1+ to 2-/5, hand gip 0/5--trace  Finger flexion in all 4 digits on left and index finger on R--exam stable. Using right hand to feed self this morning LUE: Shoulder abduction, elbow flexion 3-/5, wrist extension 2/5, hand gip 0/5- no changes.  R LE: 1+/5 proximal to distal, LLE 2/5 prox to 2/5 distally-stable exam.  -no resting tone Skin: mild redness along sacrum (moisture related) . Psychiatric: pleasant but anxious  Assessment/Plan: 1. Functional  deficits secondary to C6 SCI, motor/sensory incomplete which require 3+ hours per day of interdisciplinary therapy in a comprehensive inpatient rehab setting.  Physiatrist is providing close team supervision and 24 hour management of active medical problems listed below.  Physiatrist and rehab team continue to assess barriers to discharge/monitor patient progress toward functional and medical goals  Care Tool:  Bathing  Bathing activity did not occur: Refused Body parts bathed by patient: Right arm, Left arm, Chest, Left upper leg, Right upper leg, Abdomen   Body parts bathed by helper: Front perineal area, Buttocks, Right lower leg, Left lower leg, Face Body parts n/a: Face, Front perineal area, Buttocks(previously washed )   Bathing assist Assist Level: 2 Helpers     Upper Body Dressing/Undressing Upper body dressing   What is the patient wearing?: Pull over shirt    Upper body assist Assist Level: Maximal Assistance - Patient 25 - 49%    Lower Body Dressing/Undressing Lower body dressing      What is the patient wearing?: Pants     Lower body assist Assist for lower body dressing: 2 Helpers     Toileting Toileting    Toileting assist Assist for toileting: Dependent - Patient 0%     Transfers Chair/bed transfer  Transfers assist     Chair/bed transfer assist level: Total Assistance - Patient < 25%     Locomotion Ambulation   Ambulation assist   Ambulation activity did not occur: Safety/medical concerns  Walk 10 feet activity   Assist  Walk 10 feet activity did not occur: Safety/medical concerns        Walk 50 feet activity   Assist Walk 50 feet with 2 turns activity did not occur: Safety/medical concerns         Walk 150 feet activity   Assist Walk 150 feet activity did not occur: Safety/medical concerns         Walk 10 feet on uneven surface  activity   Assist Walk 10 feet on uneven surfaces activity did not occur:  Safety/medical concerns         Wheelchair     Assist Will patient use wheelchair at discharge?: Yes Type of Wheelchair: Power Wheelchair activity did not occur: Safety/medical concerns  Wheelchair assist level: Supervision/Verbal cueing Max wheelchair distance: 100 ft    Wheelchair 50 feet with 2 turns activity    Assist    Wheelchair 50 feet with 2 turns activity did not occur: Safety/medical concerns   Assist Level: Supervision/Verbal cueing   Wheelchair 150 feet activity     Assist Wheelchair 150 feet activity did not occur: Safety/medical concerns   Assist Level: Supervision/Verbal cueing     Medical Problem List and Plan: 1.  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  Status post C5-6 and C6-7 anterior cervical discectomy and fusion 11/29/2017.  TLSO back brace when out of bed     -Continue CIR therapies including PT, OT   -family reviewing dispo options 2.  DVT Prophylaxis/Anticoagulation:  Acute mobile DVT Right common/proximal femoral vein  -IVCF placed by radiology   -edema mgt 3. Pain Management:   Neurontin  400mg  qid with improvement and not oversedated      oxycodone as needed for pain  -add low dose baclofen 5mg  tid, hold for sedation 4. Mood:   -ongoing ego support, positive reinforcement  -neuropsych   5. Neuropsych: This patient is capable of making decisions on her own behalf. 6. Skin/Wound Care: Routine skin checks  - continue to elevate limbs when possible   -better nutrition will also help edema mgt 7. Fluids/Electrolytes/Nutrition:    -replacing potassium  - protein for hypoalbuminemia  -labs all stable, eating very well now     8.  Neurogenic bowel and bladder.  Flomax 0.4 mg daily.    -I/O caths    -AM bowel program       -  senna-s to just senna at bedtime   -glycerin supp daily at 0600 9.  Hypertension.  Norvasc 10 mg daily.   Vitals:   01/03/18 2040 01/04/18 0410   BP: 138/74 130/60  Pulse: (!) 102 96  Resp: 17 16  Temp: 98 F (36.7 C) 99.2 F (37.3 C)  SpO2: 95% 92%   Controlled on 12/5 10. Acute blood loss anemia:     -hgb 10.9  11/11--->10/6 11/29--> 11 12/4  -no signs of blood loss  -improving nutritional status     LOS: 28 days A FACE TO FACE EVALUATION WAS PERFORMED  Meredith Staggers 01/04/2018, 9:16 AM

## 2018-01-04 NOTE — Progress Notes (Addendum)
Social Work Patient ID: Sherry [name], female   DOB: 10-16-1934, 83 y.o.   MRN: [id number]   Have reviewed team conference with both pt and daughter (and son-in-law) as well as status of insurance coverage.  All aware that while, overall, team feels pt continues to make progress, the gains have been slow and minimal.  Team feels her recovery is going to be a long term process and that she is at a point that she could make the transition to SNF level of care.  Pt and daughter are admittedly disappointed in this report.  They understand the information but feel she will be better served receiving therapy in the CIR level.  Provided support and encouragement to both and stressed that therapies at SNF level are still more than would be had via [facility].   Have explained to all that, per insurance CM, the insurance medical director has denied coverage up til our targeted date of 12/11 and will only authorize through 01/05/18.  Daughter and son-in-law plan to appeal the decision and I have provided them with instruction on how to proceed with this.  They also ask that I begin discussion with local SNF to check bed availability as this is there confirmed d/c plan at this point.  Will begin bed search and await report from daughter as to their appeal for continued CIR.  Anjolie Majer, LCSW

## 2018-01-04 NOTE — Progress Notes (Signed)
Occupational Therapy Session Note  Patient Details  Name: Sherry [name] MRN: [id number] Date of Birth: 1934-05-07  Today's Date: 01/04/2018 OT Individual Time: 7342-8768 OT Individual Time Calculation (min): 70 min    Short Term Goals: Week 4:  OT Short Term Goal 1 (Week 4): STGs=LTGs due to ELOS  Skilled Therapeutic Interventions/Progress Updates:    Upon entering the room, pt supine in bed with RN present in room giving medications. Pt finishing breakfast with use of universal cuff and set up A. Pt becoming very tearful this session in regards to current condition, holidays coming up, pain management, and bowel program concerns. OT provided therapeutic use of self and we brainstormed with RN in room how to consistently complete bowel program the same each day. RN also wrote times for when medications due on white board for pt to keep track. Pt requesting to remain in bed and continuing to be tearful throughout session. OT donned B TED hose and pants onto pt with total A. Pt rolling L <> R with max A for hooking onto bed rail. Pt repositioned for comfort and call bell within reach upon exiting the room.   Therapy Documentation Precautions:  Precautions Precautions: Cervical, Back, Fall Precaution Comments: back precautions due to thoracic fx s/p ACDF; needs TLSO for all OOB activity but per order set does not need cervical collar  Required Braces or Orthoses: Spinal Brace Spinal Brace: Thoracolumbosacral orthotic, Applied in sitting position Restrictions Weight Bearing Restrictions: No   Pain: Pain Assessment Pain Scale: 0-10 Pain Score: 9  Pain Type: Acute pain Pain Location: Hand Pain Orientation: Right;Left Pain Descriptors / Indicators: Aching Pain Frequency: Constant Pain Intervention(s): Medication (See eMAR) ADL: ADL Equipment Provided: Other (comment)(dorsal wrist cuff) Eating: Supervision/safety Where Assessed-Eating: Wheelchair Grooming: Not assessed Where  Assessed-Grooming: Bed level Upper Body Bathing: Moderate assistance Where Assessed-Upper Body Bathing: Bed level Lower Body Bathing: Other (comment), Maximal assistance(2 helpers) Where Assessed-Lower Body Bathing: Bed level Upper Body Dressing: Maximal assistance, Other (Comment)(2 helpers) Where Assessed-Upper Body Dressing: Bed level Lower Body Dressing: Maximal assistance, Other (Comment)(2 helpers) Where Assessed-Lower Body Dressing: Bed level Toileting: Not assessed Toilet Transfer: Not assessed Walk-In Shower Transfer: Not assessed   Therapy/Group: Individual Therapy  [name] 01/04/2018, 9:27 AM

## 2018-01-04 NOTE — Progress Notes (Signed)
Physical Therapy Session Note  Patient Details  Name: Sherry [name] MRN: [id number] Date of Birth: 1934-02-11  Today's Date: 01/04/2018 PT Individual Time: 1300-1400 PT Individual Time Calculation (min): 60 min   Short Term Goals: Week 4:  PT Short Term Goal 1 (Week 4): Pt will maintain static sitting balance with close SBA in 50% of observations PT Short Term Goal 2 (Week 4): Pt will complete least restrictive transfer with assist x 1 in 75% of observations PT Short Term Goal 3 (Week 4): Pt will complete bed mobility with assist x 1 consistently  Skilled Therapeutic Interventions/Progress Updates:    Pt received seated in bed, very tearful and emotional upon therapist arrival. Pt reports ongoing pain in BUE and does not feel that her pain is being well controlled. Per pt report she just received pain medication and has been using heating pad intermittently for pain relief. Pt also very emotional as she feels her bowel program did not go well this AM as well as feeling depressed about her current condition and the holidays approaching. Provided emotional support to patient discussing her concerns about bowel program, current functional level, and how to approach the holidays. Played Christmas music during therapy session per patient request for improved mood. Supine BLE PROM stretches in all available planes of motion as well as PROM to fingers. Pt continues to have significant swelling in BLE even with TED hose on. Applied ACE wrap to BLE and elevated BLE with pillows to further assist with edema management. Pt left seated in bed with needs in reach, soft touch call bell in reach, bed alarm in place.  Therapy Documentation Precautions:  Precautions Precautions: Cervical, Back, Fall Precaution Comments: back precautions due to thoracic fx s/p ACDF; needs TLSO for all OOB activity but per order set does not need cervical collar  Required Braces or Orthoses: Spinal Brace Spinal Brace:  Thoracolumbosacral orthotic, Applied in sitting position Restrictions Weight Bearing Restrictions: No   Therapy/Group: Individual Therapy  Excell Seltzer, PT, DPT  01/04/2018, 3:40 PM

## 2018-01-04 NOTE — NC FL2 (Signed)
[place] MEDICAID FL2 LEVEL OF CARE SCREENING TOOL     IDENTIFICATION  Patient Name: Sherry [name] Birthdate: [birth year] Sex: female Admission Date (Current Location): 12/07/2017  [facility] and [place] Number:  Herbalist and Address:  [facility] [place] [place] [address], [place], [place] [address]      Provider Number: [id number]  Attending Physician Name and Address:  Meredith Staggers, MD  Relative Name and Phone Number:       Current Level of Care: Other (Comment)(Acute Inpatient Rehab) Recommended Level of Care: [place] Prior Approval Number:    Date Approved/Denied:   PASRR Number: [id number]  Discharge Plan: SNF    Current Diagnoses: Patient Active Problem List   Diagnosis Date Noted  . Reactive depression   . Hypokalemia   . DVT (deep vein thrombosis) in pregnancy   . Leukocytosis   . Essential hypertension   . Trauma   . 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([place])   . Neuropathic pain   . Neurogenic bowel   . Neurogenic bladder   . Benign essential HTN   . Acute blood loss anemia   . Central cord syndrome at C6 level of cervical spinal cord ([place]) 11/29/2017  . Central cord syndrome ([place]) 11/29/2017    Orientation RESPIRATION BLADDER Height & Weight     Self, Time, Situation, Place  Normal External catheter(I/O caths) Weight: (bed weight is not accurate) Height:  5\' 7"  (170.2 cm)  BEHAVIORAL SYMPTOMS/MOOD NEUROLOGICAL BOWEL NUTRITION STATUS      Continent(can be continent with timed program) Diet(regular)  AMBULATORY STATUS COMMUNICATION OF NEEDS Skin   Total Care Verbally Other (Comment)(Right foot purple Blister (DTI) scab on left heel MASD to buttocks perianal )                       Personal Care Assistance Level of Assistance  Bathing, Feeding, Dressing, Total care Bathing Assistance: Maximum assistance Feeding assistance: Limited assistance Dressing Assistance: Maximum assistance Total Care Assistance: Maximum  assistance   Functional Limitations Info             SPECIAL CARE FACTORS FREQUENCY  PT (By licensed PT), OT (By licensed OT), Bowel and bladder program     PT Frequency: 5x/wk OT Frequency: 5x/wk Bowel and Bladder Program Frequency: qd - does well with bowel program at 6am qd          Contractures Contractures Info: Not present    Additional Factors Info  Code Status, Allergies Code Status Info: Full Allergies Info: see MAR            Current Medications (01/04/2018):  This is the current hospital active medication list Current Facility-Administered Medications  Medication Dose Route Frequency Provider Last Rate Last Dose  . acetaminophen (TYLENOL) tablet 650 mg  650 mg Oral Q4H PRN Cathlyn Parsons, PA-C   650 mg at 01/03/18 1116   Or  . acetaminophen (TYLENOL) suppository 650 mg  650 mg Rectal Q4H PRN Angiulli, Lavon Paganini, PA-C      . alum & mag hydroxide-simeth (MAALOX/MYLANTA) 200-200-20 MG/5ML suspension 30 mL  30 mL Oral Q6H PRN Marletta Lor, MD      . amLODipine (NORVASC) tablet 10 mg  10 mg Oral QPC supper Cathlyn Parsons, PA-C   10 mg at 01/03/18 1708  . baclofen (LIORESAL) tablet 5 mg  5 mg Oral TID Meredith Staggers, MD   5 mg at 01/04/18 1000  . capsaicin (  ZOSTRIX) 0.025 % cream   Topical BID Angiulli, Lavon Paganini, PA-C      . dextromethorphan-guaiFENesin [facility]) 30-600 MG per 12 hr tablet 1 tablet  1 tablet Oral BID Meredith Staggers, MD   1 tablet at 01/04/18 [id number]  . feeding supplement (PRO-STAT SUGAR FREE 64) liquid 30 mL  30 mL Oral BID Meredith Staggers, MD   30 mL at 01/04/18 0814  . gabapentin (NEURONTIN) capsule 400 mg  400 mg Oral TID AC & HS Meredith Staggers, MD   400 mg at 01/04/18 0603  . Gerhardt's butt cream   Topical QID Cathlyn Parsons, PA-C      . Glycerin (Adult) 2.1 g suppository 1 suppository  1 suppository Rectal Q0600 Meredith Staggers, MD   1 suppository at 01/04/18 0603  . guaiFENesin-dextromethorphan (ROBITUSSIN DM)  100-10 MG/5ML syrup 15 mL  15 mL Oral Q6H PRN Meredith Staggers, MD      . loratadine (CLARITIN) tablet 10 mg  10 mg Oral Daily Cathlyn Parsons, PA-C   10 mg at 01/04/18 0813  . MEDLINE mouth rinse  15 mL Mouth Rinse BID Meredith Staggers, MD   15 mL at 01/04/18 [phone]  . metroNIDAZOLE (METROGEL) 1.49 % gel 1 application  1 application Topical Daily PRN Angiulli, Lavon Paganini, PA-C      . multivitamin liquid 15 mL  15 mL Oral Daily Cathlyn Parsons, PA-C   15 mL at 01/04/18 0814  . ondansetron (ZOFRAN) tablet 4 mg  4 mg Oral Q6H PRN Angiulli, Lavon Paganini, PA-C       Or  . ondansetron [facility]) injection 4 mg  4 mg Intravenous Q6H PRN Angiulli, Lavon Paganini, PA-C      . oxyCODONE (Oxy IR/ROXICODONE) immediate release tablet 5 mg  5 mg Oral Q3H PRN Cathlyn Parsons, PA-C   5 mg at 01/04/18 1008  . polycarbophil (FIBERCON) tablet 625 mg  625 mg Oral Daily Cathlyn Parsons, PA-C   625 mg at 01/04/18 0813  . polyethylene glycol (MIRALAX / GLYCOLAX) packet 17 g  17 g Oral Daily PRN Angiulli, Lavon Paganini, PA-C      . potassium chloride SA (K-DUR,KLOR-CON) CR tablet 20 mEq  20 mEq Oral Daily Meredith Staggers, MD   20 mEq at 01/04/18 0814  . sodium chloride (OCEAN) 0.65 % nasal spray 1 spray  1 spray Each Nare PRN AngiulliLavon Paganini, PA-C   1 spray at 01/01/18 1853  . sorbitol 70 % solution 30 mL  30 mL Oral Daily PRN Angiulli, Lavon Paganini, PA-C      . tamsulosin [facility]) capsule 0.4 mg  0.4 mg Oral Daily Cathlyn Parsons, PA-C   0.4 mg at 01/04/18 7026  . triamcinolone cream (KENALOG) 0.1 % 1 application  1 application Topical Daily PRN Cathlyn Parsons, PA-C         Discharge Medications: Please see discharge summary for a list of discharge medications.  Relevant Imaging Results:  Relevant Lab Results:   Additional Information SS# [id number]  Lennart Pall, LCSW

## 2018-01-04 NOTE — Consult Note (Signed)
Neuropsychological Consultation   Patient:   Sherry [name]   DOB:   1935/01/13  MR Number:  [id number]  Location:  [facility] [id number] [place] [address] Dept: [place]: [id number]           Date of Service:   01/04/2018  Start Time:   9 AM End Time:   10 AM  Provider/Observer:  Ilean Skill, Psy.D.       Clinical Neuropsychologist       Billing Code/Service: [phone]  Chief Complaint:    [name] is an 83 year old female with a history of hypertension.  The patient presented on 11/29/2017 after a fall with immediate quadriparesis.  Cranial CT scans reviewed and were unremarkable for acute intracranial process.  CT/MRI imaging showed C6-7 disc fracture and tear of anterior longitudinal ligament.  Paraspinal muscle and interspinous edema from C1-C7 compatible with ligament injury and muscle strain.  Increased cord signal at C5 and C6.  No cord hemorrhage.  T11 coronal vertebral body fracture was noted.  The patient underwent C5-6, C6-7 anterior cervical discectomy and fusion on 11/29/2017.  The patient has had continued severe motor deficits.  The patient has begun to regain some motor movement in her arms but has significant deficits in her hands.  She has some increasing motor movement for her left leg but little movement in her right leg and foot.  The patient is having significant adjustment difficulties and is feeling very anxious, depressed and tearful with the realization of the degree of loss of functioning she is having to cope with.  12/27/2017:  The patient is starting to have more motor movement in her arm/hands (still significant impairments) and increasing movement in her left leg/foot.  She is positive about these improvements and remains motivated with progress.  01/04/2018  The patient was much more upset and depressed today.  She was crying at times.  Upset  about how the bowel program went this AM.  She stresses about it not being early enough so she can do all of her therapies.  Today, she reported that is was done different than before and parts done on bed and she experienced pain and discomfort especially in her arms while being moved around.  She reports that she was very stressed this morning and her distress continues to affect her during our session.  The patient reports that she has also worried about how much functioning she will have during the [place] season and is concerned about people having pity on her.    Reason for Service:  The patient was referred for neuropsychological consultation due to coping and adjustment issues following a cervical central cord injury with resulting severe motor deficits.  Below is the HPI for the current admission.  HPI: Sherry [name] is an 83 year old right-handed female with history of hypertension.  Per chart review, family, and patient, lives with spouse.  Independent prior to admission.  Multilevel home with bed and bath on main level.  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  She has multiple family in the area.  Presented 11/29/2017 after a fall with immediate quadriparesis.  Cranial CT scan reviewed, unremarkable for acute intracranial process. CT/MRI imaging showed C6-7 disc fracture and tear of anterior longitudinal ligament.  Paraspinal muscle and interspinous edema from C1-C7 compatible ligament injury and muscle strain.  Increased cord signal at C5 and C6.  No  cord hemorrhage and also noted T11 coronal vertebral body fracture.  Underwent C5-6, C6-7 anterior cervical discectomy and fusion 11/29/2017 per Dr. Venetia Constable.  TLSO back brace when out of bed applied in sitting position.  Hospital course pain management.  Subcutaneous heparin for DVT prophylaxis.  Therapy evaluations completed with recommendations of physical medicine rehab consult.  Patient was admitted for a comprehensive  rehab program.  Current Status:  The patient was much more distressed today than she had been during our previous visit.  The patient reports that her acute distress revolved around her experience during her bowel program this a.m.  She reports that it was done in a different way than had been before and she experienced pain in her arms and was distressing to her and made her feel a loss of control regarding her overall motor functioning.  The patient is also very concerned that if the bowel program is not performed around 6 AM that it will have a deleterious effect on her ability to do the physical and occupational therapies that she is so focused on getting completed each day.   Behavioral Observation: [name]  presents as a 83 y.o.-year-old Right Caucasian Female who appeared her stated age. her dress was Appropriate and she was Well Groomed and her manners were Appropriate to the situation.  her participation was indicative of Appropriate and Attentive behaviors.  There were any physical disabilities noted.  she displayed an appropriate level of cooperation and motivation.     Interactions:    Active Appropriate and Attentive  Attention:   within normal limits and attention span and concentration were age appropriate  Memory:   within normal limits; recent and remote memory intact  Visuo-spatial:  not examined  Speech (Volume):  low  Speech:   normal; normal  Thought Process:  Coherent and Relevant  Though Content:  WNL; not suicidal and not homicidal  Orientation:   person, place, time/date and situation  Judgment:   Good  Planning:   Good  Affect:    Appropriate  Mood:    Euthymic  Insight:   Good  Intelligence:   high  Medical History:   Past Medical History:  Diagnosis Date  . Arthritis   . Cancer ([place])    skin  . Hypertension         Abuse/Trauma History: The patient recently had a traumatic fall where she suffered cervical central cord injury  resulting in severe motor deficits from her neck down.  Psychiatric History:  The patient denies any prior psychiatric history.  Family Med/Psych History:  Family History  Problem Relation Age of Onset  . Breast cancer Maternal Aunt 75    Risk of Suicide/Violence: low  Patient reports that mood is significantly improved.  Impression/DX:  SHARUNDA SALMON is an 83 year old female with a history of hypertension.  The patient presented on 11/29/2017 after a fall with immediate quadriparesis.  Cranial CT scans reviewed and were unremarkable for acute intracranial process.  CT/MRI imaging showed C6-7 disc fracture and tear of anterior longitudinal ligament.  Paraspinal muscle and interspinous edema from C1-C7 compatible with ligament injury and muscle strain.  Increased cord signal at C5 and C6.  No cord hemorrhage.  T11 coronal vertebral body fracture was noted.  The patient underwent C5-6, C6-7 anterior cervical discectomy and fusion on 11/29/2017.  The patient has had continued severe motor deficits.  The patient has begun to regain some motor movement in her arms but has significant deficits in  her hands.  She has some increasing motor movement for her left leg but little movement in her right leg and foot.  The patient is having significant adjustment difficulties and is feeling very anxious, depressed and tearful with the realization of the degree of loss of functioning she is having to cope with.  The patient was much more distressed today than she had been during our previous visit.  The patient reports that her acute distress revolved around her experience during her bowel program this a.m.  She reports that it was done in a different way than had been before and she experienced pain in her arms and was distressing to her and made her feel a loss of control regarding her overall motor functioning.  The patient is also very concerned that if the bowel program is not performed around 6 AM that it  will have a deleterious effect on her ability to do the physical and occupational therapies that she is so focused on getting completed each day.  01/04/2018  The patient was much more upset and depressed today.  She was crying at times.  Upset about how the bowel program went this AM.  She stresses about it not being early enough so she can do all of her therapies.  Today, she reported that is was done different than before and parts done on bed and she experienced pain and discomfort especially in her arms while being moved around.  She reports that she was very stressed this morning and her distress continues to affect her during our session.  The patient reports that she has also worried about how much functioning she will have during the [place] season and is concerned about people having pity on her.      Disposition/Plan:  I will follow-up with the patient first of next week to continue to assess adjustment and coping.           Electronically Signed   _______________________ Ilean Skill, Psy.D.

## 2018-01-04 NOTE — Progress Notes (Signed)
Physical Therapy Session Note  Patient Details  Name: Sherry [name] MRN: [id number] Date of Birth: [birth year]  Today's Date: 01/04/2018 PT Individual Time: 1430-1530 PT Individual Time Calculation (min): 60 min   Short Term Goals: Week 4:  PT Short Term Goal 1 (Week 4): Pt will maintain static sitting balance with close SBA in 50% of observations PT Short Term Goal 2 (Week 4): Pt will complete least restrictive transfer with assist x 1 in 75% of observations PT Short Term Goal 3 (Week 4): Pt will complete bed mobility with assist x 1 consistently  Skilled Therapeutic Interventions/Progress Updates: Pt received in bed, c/o pain as below in hands/arms, and "cold" sensation, pre-medicated and agreeable to treatment. Pt reports it has been a "rough day"; offered emotional support and encouragement and pt agreeable to participate in therapy. Supine>sit with HOB elevated, maxA; pt able to pull through UEs and activate core musculature to assist. Lateral scoot transfer maxA to w/c at level height with +2 to stabilize slide board. W/c management to/from gym with S; requires minA to back up beside bed at end of session. Downhill transfer w/c >mat table, and to return mat>w/c at end of session. Therapist demonstrated technique for head/hips relationship and core activation for lateral scoot as well as pushing through LEs; max/totalA for first 1-2 scoots to get bilat hips onto board, then pt able to demonstrate small activate scoots each direction, then requiring only min/modA to complete. Transfer was slow and effortful; therapist required to reposition LEs throughout transfer however improving initiation and LE/core activation overall. Sit <>stand in sara plus x1 trial dependently. Once in supported standing, pt able to activate B quads/glutes for terminal hip/knee extension to come into full standing. Poor tolerance in sara d/t back pain during transition sit <>stand however standing tolerance of  approximately 3 min before requesting rest break. Returned mat>w/c>room as above. Therapist added towel roll to lumbar spine to promote thoracic/lumbar extension, increased lung capacity and facilitate neutral posture for carryover into unsupported sitting. Remained semi-reclined in w/c, seatbelt intact, brother present; all needs in reach.      Therapy Documentation Precautions:  Precautions Precautions: Cervical, Back, Fall Precaution Comments: back precautions due to thoracic fx s/p ACDF; needs TLSO for all OOB activity but per order set does not need cervical collar  Required Braces or Orthoses: Spinal Brace Spinal Brace: Thoracolumbosacral orthotic, Applied in sitting position Restrictions Weight Bearing Restrictions: No Pain: Pain Assessment Pain Scale: Faces Pain Score: 6  Pain Type: Acute pain Pain Location: Hand Pain Orientation: Right;Left Pain Radiating Towards: (arms) Pain Descriptors / Indicators: Aching Pain Frequency: Constant Pain Onset: On-going Patients Stated Pain Goal: 0 Pain Intervention(s): Medication (See eMAR)    Therapy/Group: Individual Therapy  Corliss Skains 01/04/2018, 3:36 PM

## 2018-01-04 NOTE — Patient Care Conference (Signed)
Inpatient RehabilitationTeam Conference and Plan of Care Update Date: 01/03/2018   Time: 2:00 PM    Patient Name: Sherry [name]      Medical Record Number: [id number]  Date of Birth: 1934/11/24 Sex: Female         Room/Bed: 4W13C/4W13C-01 Payor Info: Payor: [employer] MEDICARE / Plan: [employer] MEDICARE HMO/PPO / Product Type: *No Product type* /    Admitting Diagnosis: Fall with SCI   central cord syndrome  Admit Date/Time:  12/07/2017  5:47 PM Admission Comments: No comment available   Primary Diagnosis:  <principal problem not specified> Principal Problem: <principal problem not specified>  Patient Active Problem List   Diagnosis Date Noted  . Reactive depression   . Hypokalemia   . DVT (deep vein thrombosis) in pregnancy   . Leukocytosis   . Essential hypertension   . Trauma   . 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([place])   . Neuropathic pain   . Neurogenic bowel   . Neurogenic bladder   . Benign essential HTN   . Acute blood loss anemia   . Central cord syndrome at C6 level of cervical spinal cord ([place]) 11/29/2017  . Central cord syndrome [facility]) 11/29/2017    Expected Discharge Date: Expected Discharge Date: (TBD- possible change to SNF)  Team Members Present: Physician leading conference: Dr. Alger Simons Social Worker Present: Lennart Pall, LCSW Nurse Present: Dorthula Nettles, RN PT Present: Other (comment)(Taylor Ervin Knack, PT) OT Present: Darleen Crocker, OT PPS Coordinator present : Daiva Nakayama, RN, CRRN;Melissa Gertie Fey     Current Status/Progress Goal Weekly Team Focus  Medical   bowel program improving, need to get timing straight. pain at times. ongoing anxiety, nutrition improved  improve hand use, activity tolerance  timing of bowel program, pain, ongoing education   Bowel/Bladder   B/B program In and out cath [facility] 12/02  Family education for care upon d/c  continue B/B program    Swallow/Nutrition/ Hydration             ADL's   total A of 1 LB ADLs, max A - total A slide board  transfers, UB self care with max A, grooming with set up and using u cuff, feeding set up use of u cuff  mod A but will likely downgrade some goals later this week to max A  adaptive self cares, baalnce, functional transfers, strengthening, pt/family edu   Mobility   maxA +1 bed mobility and transfers (+2 helpful), standbyA sitting balance, maxA +2 sit <>stand, standing frame x10 min stable vitals  mod bed and transfers, max car transfer, supervision w/c; likely will need to downgrade goals to maxA  bed mobility and transfer training, sitting balance, LE NMR   Communication             Safety/Cognition/ Behavioral Observations            Pain   Hand pain and generalized pain controlled with Oxy 5mg  prn and tylenol prn  <= 2/10   assess pain q shift and prn   Skin   Right foot purple Blister (DTI) scab on left heel MASD to buttocks perianal  Improvement of current issues no further breakdown of infection  assess skin q shift skin care and prevention    Rehab Goals Patient on target to meet rehab goals: Yes *See Care Plan and progress notes for long and short-term goals.     Barriers to Discharge  Current Status/Progress Possible Resolutions Date Resolved   Physician    Medical stability;Neurogenic Bowel &  Bladder        see medical progress notes      Nursing                  PT                    OT                  SLP                SW                Discharge Planning/Teaching Needs:  Pt to d/c home with family to provide 24/7 assistance.  Daughter already beginning home modifications. VS change of plan to SNF - still under discussion.      Team Discussion:  Continue to work on regulating bowel program with planned a.m. Meds.  Pt still with c/o pain - MD very aware.  Max - total A +1 for transfers and improved sitting balance. Having increase in hand pain.  Using AE for self feeding.  Gains continue to be made.  Revisions to Treatment Plan:  NA    Continued Need for  Acute Rehabilitation Level of Care: The patient requires daily medical management by a physician with specialized training in physical medicine and rehabilitation for the following conditions: Daily direction of a multidisciplinary physical rehabilitation program to ensure safe treatment while eliciting the highest outcome that is of practical value to the patient.: Yes Daily medical management of patient stability for increased activity during participation in an intensive rehabilitation regime.: Yes Daily analysis of laboratory values and/or radiology reports with any subsequent need for medication adjustment of medical intervention for : Neurological problems;Mood/behavior problems   I attest that I was present, lead the team conference, and concur with the assessment and plan of the team.   Basilia Stuckert 01/04/2018, 11:11 AM

## 2018-01-05 ENCOUNTER — Inpatient Hospital Stay (HOSPITAL_COMMUNITY): Payer: Medicare HMO | Admitting: Physical Therapy

## 2018-01-05 ENCOUNTER — Inpatient Hospital Stay (HOSPITAL_COMMUNITY): Payer: Medicare HMO | Admitting: Occupational Therapy

## 2018-01-05 NOTE — Progress Notes (Signed)
Physical Therapy Session Note  Patient Details  Name: Sherry [name] MRN: [id number] Date of Birth: 1934-12-06  Today's Date: 01/05/2018 PT Individual Time:1000-1100; 1500-1520 PT Minutes: 60 minutes; 30 min  Short Term Goals: Week 4:  PT Short Term Goal 1 (Week 4): Pt will maintain static sitting balance with close SBA in 50% of observations PT Short Term Goal 2 (Week 4): Pt will complete least restrictive transfer with assist x 1 in 75% of observations PT Short Term Goal 3 (Week 4): Pt will complete bed mobility with assist x 1 consistently  Skilled Therapeutic Interventions/Progress Updates:    Session 1: Pt received reclined in power w/c in room, agreeable to PT. See pain details below. Power w/c mobility x 100 ft with Supervision. Sit to stand x 2 reps in standing frame, pt tolerates standing x 3 min, x 1 min with manual cueing for upright trunk. Pt fatigues quickly in standing position and has onset of low back pain that resolves once seated. Manual cues for trunk and hip extension in standing. Use of dycem under BUE in standing for improved stability and grip with BUE on standing frame table. Sliding board transfer w/c to bed with max A x 2. Sit to supine assist x 2. Pt left semi-reclined in bed with needs in reach, bed alarm in place, daughter present.  Session 2: Pt received seated in power w/c from previous PT session. Pt reports some pain in L neck musculature, not rated. Soft-tissue massage to upper trapezius and L lateral flexors in neck. Pt reports relief of pain and tightness with STM. Power w/c mobility navigating obstacle course weaving through cones with verbal cueing for turning radius and spatial awareness to avoid obstacles. Power w/c navigation turning around cone with focus on decreasing distance between cone and w/c for tighter turning radius. Pt left semi-reclined in w/c with needs in reach, heating pad to upper back/shoulders for pain relief.  Therapy  Documentation Precautions:  Precautions Precautions: Cervical, Back, Fall Precaution Comments: back precautions due to thoracic fx s/p ACDF; needs TLSO for all OOB activity but per order set does not need cervical collar  Required Braces or Orthoses: Spinal Brace Spinal Brace: Thoracolumbosacral orthotic, Applied in sitting position Restrictions Weight Bearing Restrictions: No Pain: Pain Assessment Pain Scale: 0-10 Pain Score: 6  Pain Type: Acute pain Pain Location: Hand Pain Orientation: Right;Left Pain Radiating Towards: (arms) Pain Descriptors / Indicators: Aching Pain Frequency: Constant Pain Onset: On-going Patients Stated Pain Goal: 0 Pain Intervention(s): Medication (See eMAR)    Therapy/Group: Individual Therapy  Excell Seltzer, PT, DPT  01/05/2018, 11:44 AM

## 2018-01-05 NOTE — Progress Notes (Addendum)
[place] PHYSICAL MEDICINE & REHABILITATION PROGRESS NOTE   Subjective/Complaints: Upset about bowel program being later again today. (did have spontaneous movement on her own). More swelling in arms today  ROS: Patient denies fever, rash, sore throat, blurred vision, nausea, vomiting, diarrhea, cough, shortness of breath or chest pain, joint or back pain, headache, or mood change.     Objective:   No results found. Recent Labs    01/04/18 0450  WBC 8.2  HGB 11.0*  HCT 34.1*  PLT 357   Recent Labs    01/04/18 0450  NA 136  K 4.0  CL 101  CO2 26  GLUCOSE 98  BUN 16  CREATININE 0.42*  CALCIUM 8.2*    Intake/Output Summary (Last 24 hours) at 01/05/2018 1047 Last data filed at 01/05/2018 0151 Gross per 24 hour  Intake 780 ml  Output 1750 ml  Net -970 ml     Physical Exam: Vital Signs Blood pressure 129/64, pulse 85, temperature 98 F (36.7 C), temperature source Oral, resp. rate 18, height 5\' 7"  (1.702 m), weight 90 kg, SpO2 90 %. Constitutional: No distress . Vital signs reviewed. HEENT: EOMI, oral membranes moist Neck: supple Cardiovascular: RRR without murmur. No JVD    Respiratory: CTA Bilaterally without wheezes or rales. Normal effort    GI: BS +, non-tender, non-distended   Musculoskeletal:1+l UE edema, 1++ LE edema, left more than right (LE edema increased)    Neurological: Alert and oriented. Sensation diminished to light touch below T4, sl sense of pain Motor: RUE: Shoulder abduction, elbow flex 3+/5, elbow extension, wrist extension 1+ to 2-/5, hand gip 0/5--trace  Finger flexion in all 4 digits on left and index finger on R--exam stable. Right hand with brace LUE: Shoulder abduction, elbow flexion 3-/5, wrist extension 2/5, hand gip 0/5- no changes.  R LE: 1+/5 proximal to distal, LLE 2/5 prox to 2/5 distally-stable exam.   Skin: mild redness along sacrum . Psychiatric: pleasant but anxious  Assessment/Plan: 1. Functional deficits secondary to  C6 SCI, motor/sensory incomplete which require 3+ hours per day of interdisciplinary therapy in a comprehensive inpatient rehab setting.  Physiatrist is providing close team supervision and 24 hour management of active medical problems listed below.  Physiatrist and rehab team continue to assess barriers to discharge/monitor patient progress toward functional and medical goals  Care Tool:  Bathing  Bathing activity did not occur: Refused Body parts bathed by patient: Right arm, Left arm, Chest, Left upper leg, Right upper leg, Abdomen   Body parts bathed by helper: Front perineal area, Buttocks, Right lower leg, Left lower leg, Face Body parts n/a: Face, Front perineal area, Buttocks(previously washed )   Bathing assist Assist Level: 2 Helpers     Upper Body Dressing/Undressing Upper body dressing   What is the patient wearing?: Pull over shirt    Upper body assist Assist Level: Maximal Assistance - Patient 25 - 49%    Lower Body Dressing/Undressing Lower body dressing      What is the patient wearing?: Pants     Lower body assist Assist for lower body dressing: Total Assistance - Patient < 25%     Toileting Toileting    Toileting assist Assist for toileting: Dependent - Patient 0%     Transfers Chair/bed transfer  Transfers assist     Chair/bed transfer assist level: Maximal Assistance - Patient 25 - 49%     Locomotion Ambulation   Ambulation assist   Ambulation activity did not occur: Safety/medical concerns  Walk 10 feet activity   Assist  Walk 10 feet activity did not occur: Safety/medical concerns        Walk 50 feet activity   Assist Walk 50 feet with 2 turns activity did not occur: Safety/medical concerns         Walk 150 feet activity   Assist Walk 150 feet activity did not occur: Safety/medical concerns         Walk 10 feet on uneven surface  activity   Assist Walk 10 feet on uneven surfaces activity did not  occur: Safety/medical concerns         Wheelchair     Assist Will patient use wheelchair at discharge?: Yes Type of Wheelchair: Power Wheelchair activity did not occur: Safety/medical concerns  Wheelchair assist level: Supervision/Verbal cueing Max wheelchair distance: 100 ft    Wheelchair 50 feet with 2 turns activity    Assist    Wheelchair 50 feet with 2 turns activity did not occur: Safety/medical concerns   Assist Level: Supervision/Verbal cueing   Wheelchair 150 feet activity     Assist Wheelchair 150 feet activity did not occur: Safety/medical concerns   Assist Level: Supervision/Verbal cueing     Medical Problem List and Plan: 1.  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  Status post C5-6 and C6-7 anterior cervical discectomy and fusion 11/29/2017.  TLSO back brace when out of bed     -Continue CIR therapies including PT, OT  2.  DVT Prophylaxis/Anticoagulation:  Acute mobile DVT Right common/proximal femoral vein  -IVCF placed by radiology   -edema mgt 3. Pain Management:   Neurontin  400mg  qid with improvement and not oversedated      oxycodone as needed for pain  -continue low dose baclofen 5mg  tid, hold for sedation 4. Mood:   -ongoing ego support, positive reinforcement  -neuropsych   5. Neuropsych: This patient is capable of making decisions on her own behalf. 6. Skin/Wound Care: Routine skin checks  -edema: continue to elevate limbs when possible    -she is eating well but will re-check cmet tomorrow  7. Fluids/Electrolytes/Nutrition:    -replacing potassium  - protein for hypoalbuminemia  -labs all stable, eating very well now 8.  Neurogenic bowel and bladder.  Flomax 0.4 mg daily.    -I/O caths    -AM bowel program       -  senna-s to just senna at bedtime   -glycerin supp daily at 0600   -reiterated need for 0600 program so as to avoid conflict with therapy 9.  Hypertension.   Norvasc 10 mg daily.   Vitals:   01/04/18 1936 01/05/18 0432  BP: 125/71 129/64  Pulse: 100 85  Resp: 17 18  Temp: 98.6 F (37 C) 98 F (36.7 C)  SpO2: 92% 90%   Controlled on 12/5 10. Acute blood loss anemia:     -hgb 10.9  11/11--->10/6 11/29--> 11 12/4  -no signs of blood loss  -improving nutritional status     LOS: 29 days A FACE TO FACE EVALUATION WAS PERFORMED  Meredith Staggers 01/05/2018, 10:47 AM

## 2018-01-05 NOTE — Progress Notes (Signed)
Occupational Therapy Session Note  Patient Details  Name: Sherry [name] MRN: [id number] Date of Birth: 08-27-1934  Today's Date: 01/05/2018 OT Individual Time: 9381-0175 OT Individual Time Calculation (min): 72 min    Short Term Goals: Week 4:  OT Short Term Goal 1 (Week 4): STGs=LTGs due to ELOS  Skilled Therapeutic Interventions/Progress Updates:    Upon entering the room, pt supine in bed with family member, Therapist, sports, and Education officer, museum present in room. Continued discussion over set up for bowel program with team and physican input. Pt's daughter leaving room. Pt finishing breakfast with set up A and use of u cuff. Pt rolling L > R with max A and pt hooking onto rail to don LB clothing. Supine >sit with total A to EOB. Pt initially needing min- mod A for sitting balance and able to progress to close supervision with min verbal cuing for technique. Slide board transfer into power chair with max A to L. Pt turned chair around and tilted back for comfort. Call bell and all needed items within reach upon exiting the room.   Therapy Documentation Precautions:  Precautions Precautions: Cervical, Back, Fall Precaution Comments: back precautions due to thoracic fx s/p ACDF; needs TLSO for all OOB activity but per order set does not need cervical collar  Required Braces or Orthoses: Spinal Brace Spinal Brace: Thoracolumbosacral orthotic, Applied in sitting position Restrictions Weight Bearing Restrictions: No Pain: Pain Assessment Pain Scale: 0-10 Pain Score: 6  ADL: ADL Equipment Provided: Other (comment)(dorsal wrist cuff) Eating: Supervision/safety Where Assessed-Eating: Wheelchair Grooming: Not assessed Where Assessed-Grooming: Bed level Upper Body Bathing: Moderate assistance Where Assessed-Upper Body Bathing: Bed level Lower Body Bathing: Other (comment), Maximal assistance(2 helpers) Where Assessed-Lower Body Bathing: Bed level Upper Body Dressing: Maximal assistance, Other  (Comment)(2 helpers) Where Assessed-Upper Body Dressing: Bed level Lower Body Dressing: Maximal assistance, Other (Comment)(2 helpers) Where Assessed-Lower Body Dressing: Bed level Toileting: Not assessed Toilet Transfer: Not assessed Walk-In Shower Transfer: Not assessed   Therapy/Group: Individual Therapy  [name] 01/05/2018, 12:34 PM

## 2018-01-05 NOTE — Care Management Important Message (Signed)
Important Message  Patient Details  Name: Sherry [name] MRN: [id number] Date of Birth: 04-13-1934   Medicare Important Message Given:   01/05/2018    Lennart Pall, [place] 01/05/2018, 2:09 PM

## 2018-01-05 NOTE — Progress Notes (Signed)
Physical Therapy Session Note  Patient Details  Name: Sherry [name] MRN: [id number] Date of Birth: 1934-12-19  Today's Date: 01/05/2018 PT Individual Time: 1415-1500 PT Individual Time Calculation (min): 45 min   Short Term Goals: Week 4:  PT Short Term Goal 1 (Week 4): Pt will maintain static sitting balance with close SBA in 50% of observations PT Short Term Goal 2 (Week 4): Pt will complete least restrictive transfer with assist x 1 in 75% of observations PT Short Term Goal 3 (Week 4): Pt will complete bed mobility with assist x 1 consistently  Skilled Therapeutic Interventions/Progress Updates: pt received supine in bed, denies pain and agreeable to treatment. Daughter present to observe session and provide encouragement. Supine>sit with HOB elevated, maxA +1. Transfer bed>w/c with transferboard and maxA, +2 to stabilize equipment. W/c <>mat table with slight downhill, maxA +2 to scoot onto board, then maxA +1 for remaining scoots across board. Sit <>stand in stedy with maxA +2; pt demonstrates quad/glute activation into terminal hip/knee extension with multimodal cueing. Sit >stand from mat table x1, from stedy seat x2; between trials pt maintains neutral sitting balance in high perch position with BUEs on stedy frame. Seated semi-reclined sit ups 2x5 reps with gradual increase in recline angle. Returned to w/c as above; handoff to PT for next session.      Therapy Documentation Precautions:  Precautions Precautions: Cervical, Back, Fall Precaution Comments: back precautions due to thoracic fx s/p ACDF; needs TLSO for all OOB activity but per order set does not need cervical collar  Required Braces or Orthoses: Spinal Brace Spinal Brace: Thoracolumbosacral orthotic, Applied in sitting position Restrictions Weight Bearing Restrictions: No    Therapy/Group: Individual Therapy  Corliss Skains 01/05/2018, 3:52 PM

## 2018-01-06 ENCOUNTER — Inpatient Hospital Stay (HOSPITAL_COMMUNITY): Payer: Medicare HMO | Admitting: Occupational Therapy

## 2018-01-06 ENCOUNTER — Inpatient Hospital Stay (HOSPITAL_COMMUNITY): Payer: Medicare HMO | Admitting: Physical Therapy

## 2018-01-06 LAB — COMPREHENSIVE METABOLIC PANEL
ALBUMIN: 2.3 g/dL — AB (ref 3.5–5.0)
ALT: 29 U/L (ref 0–44)
AST: 23 U/L (ref 15–41)
Alkaline Phosphatase: 84 U/L (ref 38–126)
Anion gap: 13 (ref 5–15)
BUN: 18 mg/dL (ref 8–23)
CALCIUM: 8.3 mg/dL — AB (ref 8.9–10.3)
CO2: 23 mmol/L (ref 22–32)
CREATININE: 0.6 mg/dL (ref 0.44–1.00)
Chloride: 101 mmol/L (ref 98–111)
GFR calc Af Amer: >60 mL/min (ref >60)
GFR calc non Af Amer: >60 mL/min (ref >60)
GLUCOSE: 100 mg/dL — AB (ref 70–99)
Potassium: 3.8 mmol/L (ref 3.5–5.1)
SODIUM: 137 mmol/L (ref 135–145)
Total Bilirubin: 1 mg/dL (ref 0.3–1.2)
Total Protein: 5.3 g/dL — ABNORMAL LOW (ref 6.5–8.1)

## 2018-01-06 NOTE — Progress Notes (Signed)
[place] PHYSICAL MEDICINE & REHABILITATION PROGRESS NOTE   Subjective/Complaints: Had results with bowel program this morning. Had sense of bladder being full early this morning and was cathed for 550cc. Notices that she produces small amounts of stool when she is cathed.   ROS: Patient denies fever, rash, sore throat, blurred vision, nausea, vomiting, diarrhea, cough, shortness of breath or chest pain,   headache, or mood change.   Objective:   No results found. Recent Labs    01/04/18 0450  WBC 8.2  HGB 11.0*  HCT 34.1*  PLT 357   Recent Labs    01/04/18 0450 01/06/18 0502  NA 136 137  K 4.0 3.8  CL 101 101  CO2 26 23  GLUCOSE 98 100*  BUN 16 18  CREATININE 0.42* 0.60  CALCIUM 8.2* 8.3*    Intake/Output Summary (Last 24 hours) at 01/06/2018 1101 Last data filed at 01/06/2018 0400 Gross per 24 hour  Intake 600 ml  Output 3350 ml  Net -2750 ml     Physical Exam: Vital Signs Blood pressure 129/84, pulse 93, temperature 99 F (37.2 C), resp. rate 18, height 5\' 7"  (1.702 m), weight 89.7 kg, SpO2 91 %. Constitutional: No distress . Vital signs reviewed. HEENT: EOMI, oral membranes moist Neck: supple Cardiovascular: RRR without murmur. No JVD    Respiratory: CTA Bilaterally without wheezes or rales. Normal effort    GI: BS +, non-tender, non-distended   Musculoskeletal:1+l UE edema, 1++ LE edema, left more than right (LE edema increased)    Neurological: Alert and oriented.  Sensation diminished to light touch below T4, no real change.  Motor: RUE: Shoulder abduction, elbow flex 3+/5, elbow extension, wrist extension 1+ to 2-/5, hand gip 0/5--trace  Finger flexion in all 4 digits on left and index finger on R--exam stable. Right hand with brace LUE: Shoulder abduction, elbow flexion 3-/5, wrist extension 2/5, hand gip 0/5- no changes.  R LE: 1+/5 proximal to distal, LLE 2/5 prox to 2/5 distally-stable exam.   Skin: a few ecchymoses. Sacrum not  witnessed. Psychiatric: pleasant but anxious  Assessment/Plan: 1. 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  Physiatrist is providing close team supervision and 24 hour management of active medical problems listed below.  Physiatrist and rehab team continue to assess barriers to discharge/monitor patient progress toward functional and medical goals  Care Tool:  Bathing  Bathing activity did not occur: Refused Body parts bathed by patient: Right arm, Left arm, Chest, Left upper leg, Right upper leg, Abdomen   Body parts bathed by helper: Front perineal area, Buttocks, Right lower leg, Left lower leg, Face Body parts n/a: Face, Front perineal area, Buttocks(previously washed )   Bathing assist Assist Level: 2 Helpers     Upper Body Dressing/Undressing Upper body dressing   What is the patient wearing?: Pull over shirt    Upper body assist Assist Level: Maximal Assistance - Patient 25 - 49%    Lower Body Dressing/Undressing Lower body dressing      What is the patient wearing?: Pants     Lower body assist Assist for lower body dressing: Total Assistance - Patient < 25%     Toileting Toileting    Toileting assist Assist for toileting: Dependent - Patient 0%     Transfers Chair/bed transfer  Transfers assist     Chair/bed transfer assist level: 2 Helpers     Locomotion  Ambulation   Ambulation assist   Ambulation activity did not occur: Safety/medical concerns          Walk 10 feet activity   Assist  Walk 10 feet activity did not occur: Safety/medical concerns        Walk 50 feet activity   Assist Walk 50 feet with 2 turns activity did not occur: Safety/medical concerns         Walk 150 feet activity   Assist Walk 150 feet activity did not occur: Safety/medical concerns         Walk 10 feet on uneven  surface  activity   Assist Walk 10 feet on uneven surfaces activity did not occur: Safety/medical concerns         Wheelchair     Assist Will patient use wheelchair at discharge?: Yes Type of Wheelchair: Power Wheelchair activity did not occur: Safety/medical concerns  Wheelchair assist level: Supervision/Verbal cueing Max wheelchair distance: 100 ft    Wheelchair 50 feet with 2 turns activity    Assist    Wheelchair 50 feet with 2 turns activity did not occur: Safety/medical concerns   Assist Level: Supervision/Verbal cueing   Wheelchair 150 feet activity     Assist Wheelchair 150 feet activity did not occur: Safety/medical concerns   Assist Level: Supervision/Verbal cueing     Medical Problem List and Plan: 1.  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  Status post C5-6 and C6-7 anterior cervical discectomy and fusion 11/29/2017.  TLSO back brace when out of bed     -Continue CIR therapies including PT, OT   -working toward SNF placement 2.  DVT Prophylaxis/Anticoagulation:  Acute mobile DVT Right common/proximal femoral vein  -IVCF placed by radiology   -edema mgt 3. Pain Management:   Neurontin  400mg  qid with improvement and not oversedated      oxycodone as needed for pain  -continue low dose baclofen 5mg  tid, held for sedation 4. Mood:   -ongoing ego support, positive reinforcement  -neuropsych   5. Neuropsych: This patient is capable of making decisions on her own behalf. 6. Skin/Wound Care: Routine skin checks  -edema: continue to elevate limbs when possible   -CMET reviewed today    -she is eating well but will albumin still low (2.3) Prealbumin has been trending up nicely however. Continue supps 7. Fluids/Electrolytes/Nutrition:    -replacing potassium  - protein for hypoalbuminemia  -labs all stable, eating very well   8.  Neurogenic bowel and bladder.  Flomax 0.4 mg daily.    -I/O  caths    -AM bowel program       -  senna-s to just senna at bedtime   -glycerin supp daily at 0600   -reiterated need for 0600 program so as to avoid conflict with therapy   -discussed with pt/daughter that small, "un-timed" bm's should happen over time as her regimen/diet/timing is fine-tuned.  9.  Hypertension.  Norvasc 10 mg daily.   Vitals:   01/05/18 2105 01/06/18 0608  BP: 118/75 129/84  Pulse: 92 93  Resp: 18   Temp: 98.7 F (37.1 C) 99 F (37.2 C)  SpO2: 90% 91%   Controlled on 12/6 10. Acute blood loss anemia:     -hgb 10.9  11/11--->10/6 11/29--> 11 12/4  -no signs of blood loss  -improving nutritional status     LOS: 30 days A FACE TO FACE EVALUATION WAS PERFORMED  Meredith Staggers 01/06/2018, 11:01 AM

## 2018-01-06 NOTE — Progress Notes (Signed)
Occupational Therapy Session Note  Patient Details  Name: Sherry [name] MRN: [id number] Date of Birth: 08/19/1934  Today's Date: 01/06/2018 OT Individual Time: 1302-1330 OT Individual Time Calculation (min): 28 min   Short Term Goals: Week 3:  OT Short Term Goal 1 (Week 3): Pt will perform static sitting balance on EOB for 5 minutes with max A or less. OT Short Term Goal 1 - Progress (Week 3): Progressing toward goal OT Short Term Goal 2 (Week 3): Pt will perform UB dressing with mod A. OT Short Term Goal 2 - Progress (Week 3): Progressing toward goal OT Short Term Goal 4 (Week 3): STGs=LTGS due to ELOS  Skilled Therapeutic Interventions/Progress Updates:    Pt greeted semi-reclined in bed and agreeable to OT treatment session focused on B UE strengthening. Pt given handouts for hand exercises using theraputty. Focused on grip and pinch strength using soft tan putty. Pt left semi-reclined in bed at end of session with needs met and family present.   Therapy Documentation Precautions:  Precautions Precautions: Cervical, Back, Fall Precaution Comments: back precautions due to thoracic fx s/p ACDF; needs TLSO for all OOB activity but per order set does not need cervical collar  Required Braces or Orthoses: Spinal Brace Spinal Brace: Thoracolumbosacral orthotic, Applied in sitting position Restrictions Weight Bearing Restrictions: No Pain: Pain Assessment Pain Scale: 0-10 Pain Score: 0-No pain   Therapy/Group: Individual Therapy  Elisabeth S Doe 01/06/2018, 1:34 PM  

## 2018-01-06 NOTE — Progress Notes (Signed)
Physical Therapy Session Note  Patient Details  Name: Sherry [name] MRN: [id number] Date of Birth: July 15, 1934  Today's Date: 01/06/2018 PT Individual Time: 1100-1200 PT Individual Time Calculation (min): 60 min   Short Term Goals: Week 4:  PT Short Term Goal 1 (Week 4): Pt will maintain static sitting balance with close SBA in 50% of observations PT Short Term Goal 2 (Week 4): Pt will complete least restrictive transfer with assist x 1 in 75% of observations PT Short Term Goal 3 (Week 4): Pt will complete bed mobility with assist x 1 consistently  Skilled Therapeutic Interventions/Progress Updates:    Pt received seated in w/c in room, agreeable to PT session. No complaints of pain but does report B hands feel very cold. Power w/c mobility x 150 ft with Supervision navigating hallways and obstacles from patient's room to therapy gym. Sliding board transfer w/c to/from mat table with max A x 2. Sitting balance EOM with close SBA with BLE supported. Attempt sit to stand with 3 muskateers with cueing for anterior weight shift, pt unable to come to a full stand and reports onset of R shoulder pain. Sit to stand in standing frame with min A for hip positioning and upright trunk posture. Pt has onset of nausea in standing and requests to sit back down. Nausea subsides once seated. Sliding board transfer w/c to bed with max A x 2. Sit to supine total A x 2. Pt left semi-reclined in bed with needs in reach, heating pad to hands, bed alarm in place.  Therapy Documentation Precautions:  Precautions Precautions: Cervical, Back, Fall Precaution Comments: back precautions due to thoracic fx s/p ACDF; needs TLSO for all OOB activity but per order set does not need cervical collar  Required Braces or Orthoses: Spinal Brace Spinal Brace: Thoracolumbosacral orthotic, Applied in sitting position Restrictions Weight Bearing Restrictions: No   Therapy/Group: Individual Therapy  Excell Seltzer, PT,  DPT' 01/06/2018, 4:36 PM

## 2018-01-06 NOTE — Progress Notes (Signed)
Occupational Therapy Session Note  Patient Details  Name: Sherry [name] MRN: [id number] Date of Birth: [birth year]  Today's Date: 01/06/2018 OT Individual Time: 1425-1500 OT Individual Time Calculation (min): 35 min  25 minutes missed due to RN care  Short Term Goals: Week 4:  OT Short Term Goal 1 (Week 4): STGs=LTGs due to ELOS  Skilled Therapeutic Interventions/Progress Updates:    25 minutes missed initially due to pt being cathed. Afterwards pt was lying in bed, a little teary, and reported feeling very fatigued from therapies. Agreeable to get OOB. 2 helpers for supine<sit using logroll technique. Pt able to maintain static sitting balance with supervision for 1 helper to don TLSO. 2 assist for slideboard<PWC and for scooting hips back reciprocally in w/c. She navigated PWC to sink with min vcs. She then engaged in oral care and handwashing using universal cuff as needed. Mod A for anterior weight shift to expectorate into sink and reach for soap dispenser. Able to assist with uncapping/capping toothpaste. Lotion application completed with Mod A for squeezing tube. She then navigated PWC near bed. Pt becoming teary due to feelings of frustration, which she exhibited during beginning of session. OT provided emotional support, encouragement, and calming cues during tx. Once she was assisted with positioning near bed, pt tilted PWC for pressure relief. She was left with soft call bell, k-pad for hands, and visitor present.   Therapy Documentation Precautions:  Precautions Precautions: Cervical, Back, Fall Precaution Comments: back precautions due to thoracic fx s/p ACDF; needs TLSO for all OOB activity but per order set does not need cervical collar  Required Braces or Orthoses: Spinal Brace Spinal Brace: Thoracolumbosacral orthotic, Applied in sitting position Restrictions Weight Bearing Restrictions: No Vital Signs: Therapy Vitals Temp: 98.9 F (37.2 C) Temp Source: Oral Pulse  Rate: (!) 102 Resp: 18 BP: 125/66 Patient Position (if appropriate): Sitting Oxygen Therapy SpO2: 94 % O2 Device: Room Air Pain: 8/10 in hands. RN in during session to provide medication  Pain Assessment Pain Scale: 0-10 Pain Score: 8  Pain Type: Acute pain Pain Location: Hand Pain Orientation: Left;Right Pain Descriptors / Indicators: Aching;Constant Pain Frequency: Constant Pain Onset: On-going Patients Stated Pain Goal: 3 Pain Intervention(s): Medication (See eMAR) ADL: ADL Equipment Provided: Other (comment)(dorsal wrist cuff) Eating: Supervision/safety Where Assessed-Eating: Wheelchair Grooming: Not assessed Where Assessed-Grooming: Bed level Upper Body Bathing: Moderate assistance Where Assessed-Upper Body Bathing: Bed level Lower Body Bathing: Other (comment), Maximal assistance(2 helpers) Where Assessed-Lower Body Bathing: Bed level Upper Body Dressing: Maximal assistance, Other (Comment)(2 helpers) Where Assessed-Upper Body Dressing: Bed level Lower Body Dressing: Maximal assistance, Other (Comment)(2 helpers) Where Assessed-Lower Body Dressing: Bed level Toileting: Not assessed Toilet Transfer: Not assessed Walk-In Shower Transfer: Not assessed      Therapy/Group: Individual Therapy  Ettore Trebilcock A Aneudy Champlain 01/06/2018, 3:34 PM

## 2018-01-06 NOTE — Progress Notes (Signed)
Occupational Therapy Session Note  Patient Details  Name: Sherry [name] MRN: [id number] Date of Birth: 1934/11/05  Today's Date: 01/06/2018 OT Individual Time: 0915-1020 OT Individual Time Calculation (min): 65 min    Short Term Goals: Week 4:  OT Short Term Goal 1 (Week 4): STGs=LTGs due to ELOS  Skilled Therapeutic Interventions/Progress Updates:    Patient in bed upon arrival.  She denies pain at this time. ADL - bathing max A overall - she utilized bilateral hand mitts for washing upper body, OH shirt with max A, LB dressing dependent, donn TLSO dependent, mod A to brush hair Rolling in bed with max A Supine to SSP max A, SB transfer to power w/c with Max A of 2 Patient remained in the power chair at close of session with call bell in reach    Therapy Documentation Precautions:  Precautions Precautions: Cervical, Back, Fall Precaution Comments: back precautions due to thoracic fx s/p ACDF; needs TLSO for all OOB activity but per order set does not need cervical collar  Required Braces or Orthoses: Spinal Brace Spinal Brace: Thoracolumbosacral orthotic, Applied in sitting position Restrictions Weight Bearing Restrictions: No General:   Vital Signs:   Pain: Pain Assessment Pain Scale: 0-10 Pain Score: 0-No pain   Therapy/Group: Individual Therapy  Carlos Levering 01/06/2018, 12:34 PM

## 2018-01-07 ENCOUNTER — Inpatient Hospital Stay (HOSPITAL_COMMUNITY): Payer: Medicare HMO | Admitting: Occupational Therapy

## 2018-01-07 NOTE — Progress Notes (Signed)
Call received from [facility] regarding second appeal. Appeal was denied. Last date covered is 01/06/18. First day of financial liability is 01/07/18. Dtr was notified by appeal committee of results, aware of financial liability resumed as of today. Please contact them for any questions. Margarito Liner

## 2018-01-07 NOTE — Progress Notes (Signed)
Physical Therapy Weekly Progress Note  Patient Details  Name: Sherry [name] MRN: [id number] Date of Birth: [birth year]  Beginning of progress report period: December 30, 2017 End of progress report period: January 07, 2018   Patient has met 3 of 3 short term goals.  Pt currently requires maxA +1 bed mobility, maxA of 1 or 2 depending on setup, maneuvering power chair with S overall. Demonstrating static sitting balance with consistent supervision with progression towards maintaining balance with dynamic UE activity. Pt able to tolerate standing in standing frame and tilt table 10-15 min. Able to demonstrate LE quad/glute activation to start assisting with transfers and sit <>stand however progress has been slow with limited functional gains.   Patient continues to demonstrate the following deficits muscle weakness, muscle joint tightness and muscle paralysis, decreased cardiorespiratoy endurance, unbalanced muscle activation and decreased coordination and decreased sitting balance, decreased standing balance, decreased postural control and decreased balance strategies and therefore will continue to benefit from skilled PT intervention to increase functional independence with mobility.  Patient progressing toward long term goals..  Continue plan of care.  PT Short Term Goals Week 4:  PT Short Term Goal 1 (Week 4): Pt will maintain static sitting balance with close SBA in 50% of observations PT Short Term Goal 1 - Progress (Week 4): Met PT Short Term Goal 2 (Week 4): Pt will complete least restrictive transfer with assist x 1 in 75% of observations PT Short Term Goal 2 - Progress (Week 4): Met PT Short Term Goal 3 (Week 4): Pt will complete bed mobility with assist x 1 consistently PT Short Term Goal 3 - Progress (Week 4): Met Week 5:  PT Short Term Goal 1 (Week 5): =LTG due to anticipated d/c to SNF   Therapy Documentation Precautions:  Precautions Precautions: Cervical, Back,  Fall Precaution Comments: back precautions due to thoracic fx s/p ACDF; needs TLSO for all OOB activity but per order set does not need cervical collar  Required Braces or Orthoses: Spinal Brace Spinal Brace: Thoracolumbosacral orthotic, Applied in sitting position Restrictions Weight Bearing Restrictions: No   Corliss Skains 01/07/2018, 3:36 PM

## 2018-01-07 NOTE — Progress Notes (Signed)
Occupational Therapy Session Note  Patient Details  Name: Sherry [name] MRN: [id number] Date of Birth: [birth year]  Today's Date: 01/07/2018 OT Individual Time: 0930-1000 OT Individual Time Calculation (min): 30 min    Short Term Goals: Week 1:  OT Short Term Goal 1 (Week 1): Pt will perform bed mobility with maxA+2 as precursor to EOB/OOB ADL. OT Short Term Goal 1 - Progress (Week 1): Met OT Short Term Goal 2 (Week 1): Pt will perform UB bathing with maxA using AE PRN.  OT Short Term Goal 2 - Progress (Week 1): Met OT Short Term Goal 3 (Week 1): Pt will don overhead shirt with maxA using compensatory strategies PRN.  OT Short Term Goal 3 - Progress (Week 1): Progressing toward goal Week 2:  OT Short Term Goal 1 (Week 2): Pt will complete 1 grooming task with AE and Min A OT Short Term Goal 1 - Progress (Week 2): Met OT Short Term Goal 2 (Week 2): Pt will complete UB dressing with Max A  OT Short Term Goal 2 - Progress (Week 2): Met OT Short Term Goal 3 (Week 2): Pts dtr will demonstrate carryover of pressure relief education by assisting pt with position changes during daily routine  OT Short Term Goal 3 - Progress (Week 2): Met Week 3:  OT Short Term Goal 1 (Week 3): Pt will perform static sitting balance on EOB for 5 minutes with max A or less. OT Short Term Goal 1 - Progress (Week 3): Progressing toward goal OT Short Term Goal 2 (Week 3): Pt will perform UB dressing with mod A. OT Short Term Goal 2 - Progress (Week 3): Progressing toward goal OT Short Term Goal 4 (Week 3): STGs=LTGS due to ELOS  Skilled Therapeutic Interventions/Progress Updates:    pt received in bed stating her hands were in a great deal of pain.  Used gentle massage and joint mobilization followed by PROM to B hands focusing on MPs and PIPs.  Pt stated her hands felt better and were in less discomfort.  Pt engaged in B shoulder strengthening (one arm at a time) with AROM with gentle A to support each arm:  overhead arm circles, PNF D1 and D2, sh and elb flex.  Pt adjusted in bed and recommended pt keep hands warm under her blanket and then to do at least 10 reps of sh flexion each hour.  Pt stated she understood.  Pt in bed with all needs met.     Therapy Documentation Precautions:  Precautions Precautions: Cervical, Back, Fall Precaution Comments: back precautions due to thoracic fx s/p ACDF; needs TLSO for all OOB activity but per order set does not need cervical collar  Required Braces or Orthoses: Spinal Brace Spinal Brace: Thoracolumbosacral orthotic, Applied in sitting position Restrictions Weight Bearing Restrictions: No    Pain: Pain Assessment Pain Scale: 0-10 Pain Score: 8  Pain Type: Acute pain Pain Location: Hand Pain Orientation: Left;Right Pain Radiating Towards: arms Pain Descriptors / Indicators: Aching Pain Frequency: Constant Pain Onset: On-going Patients Stated Pain Goal: 3 Pain Intervention(s): Medication (See eMAR)   Therapy/Group: Individual Therapy  Yoder 01/07/2018, 9:15 AM

## 2018-01-07 NOTE — Progress Notes (Signed)
[facility] PHYSICAL MEDICINE & REHABILITATION PROGRESS NOTE   Subjective/Complaints: No issues overnight she feels like she is moving her legs a little bit more daughter is at bedside  ROS: Patient denies this pain, shortness of breath, nausea vomiting diarrhea or constipation  Objective:   No results found. No results for input(s): WBC, HGB, HCT, PLT in the last 72 hours. Recent Labs    01/06/18 0502  NA 137  K 3.8  CL 101  CO2 23  GLUCOSE 100*  BUN 18  CREATININE 0.60  CALCIUM 8.3*    Intake/Output Summary (Last 24 hours) at 01/07/2018 1344 Last data filed at 01/07/2018 0804 Gross per 24 hour  Intake 480 ml  Output 850 ml  Net -370 ml     Physical Exam: Vital Signs Blood pressure 119/67, pulse 86, temperature 98.2 F (36.8 C), temperature source Oral, resp. rate 18, height 5\' 7"  (1.702 m), weight 89.7 kg, SpO2 96 %. Constitutional: No distress . Vital signs reviewed. HEENT: EOMI, oral membranes moist Neck: supple Cardiovascular: RRR without murmur. No JVD    Respiratory: CTA Bilaterally without wheezes or rales. Normal effort    GI: BS +, non-tender, non-distended   Musculoskeletal:1+l UE edema, 1++ LE edema, left more than right (LE edema increased)    Neurological: Alert and oriented.  Sensation diminished to light touch below T4, no real change.  Motor: RUE: Shoulder abduction, elbow flex 3+/5, elbow extension, wrist extension 1+ to 2-/5, hand gip 0/5--trace  Finger flexion in all 4 digits on left and index finger on R--exam stable. Right hand with brace LUE: Shoulder abduction, elbow flexion 3-/5, wrist extension 2/5, hand gip 0/5- no changes.  R LE: 1+/5 proximal to distal, LLE 2/5 prox to 2/5 distally-stable exam.  Trace bilateral hip adduction Skin: a few ecchymoses. Sacrum not witnessed. Psychiatric: pleasant but anxious  Assessment/Plan: 1. Functional deficits secondary to C6 SCI, motor/sensory incomplete which require 3+ hours per day of  interdisciplinary therapy in a comprehensive inpatient rehab setting.  Physiatrist is providing close team supervision and 24 hour management of active medical problems listed below.  Physiatrist and rehab team continue to assess barriers to discharge/monitor patient progress toward functional and medical goals  Care Tool:  Bathing  Bathing activity did not occur: Refused Body parts bathed by patient: Right arm, Left arm, Chest, Face   Body parts bathed by helper: Abdomen, Front perineal area, Buttocks, Right upper leg, Left upper leg, Right lower leg, Left lower leg Body parts n/a: Face, Front perineal area, Buttocks(previously washed )   Bathing assist Assist Level: Moderate Assistance - Patient 50 - 74%     Upper Body Dressing/Undressing Upper body dressing   What is the patient wearing?: Pull over shirt    Upper body assist Assist Level: Maximal Assistance - Patient 25 - 49%    Lower Body Dressing/Undressing Lower body dressing      What is the patient wearing?: Pants, Incontinence brief     Lower body assist Assist for lower body dressing: Total Assistance - Patient < 25%     Toileting Toileting    Toileting assist Assist for toileting: Dependent - Patient 0%     Transfers Chair/bed transfer  Transfers assist     Chair/bed transfer assist level: 2 Helpers     Locomotion Ambulation   Ambulation assist   Ambulation activity did not occur: Safety/medical concerns          Walk 10 feet activity   Assist  Walk 10  feet activity did not occur: Safety/medical concerns        Walk 50 feet activity   Assist Walk 50 feet with 2 turns activity did not occur: Safety/medical concerns         Walk 150 feet activity   Assist Walk 150 feet activity did not occur: Safety/medical concerns         Walk 10 feet on uneven surface  activity   Assist Walk 10 feet on uneven surfaces activity did not occur: Safety/medical concerns          Wheelchair     Assist Will patient use wheelchair at discharge?: Yes Type of Wheelchair: Power Wheelchair activity did not occur: Safety/medical concerns  Wheelchair assist level: Supervision/Verbal cueing Max wheelchair distance: 150'    Wheelchair 50 feet with 2 turns activity    Assist    Wheelchair 50 feet with 2 turns activity did not occur: Safety/medical concerns   Assist Level: Supervision/Verbal cueing   Wheelchair 150 feet activity     Assist Wheelchair 150 feet activity did not occur: Safety/medical concerns   Assist Level: Supervision/Verbal cueing     Medical Problem List and Plan: 1.  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  Status post C5-6 and C6-7 anterior cervical discectomy and fusion 11/29/2017.  TLSO back brace when out of bed     -Continue CIR therapies including PT, OT, tolerating therapies well  -working toward SNF placement 2.  DVT Prophylaxis/Anticoagulation:  Acute mobile DVT Right common/proximal femoral vein  -IVCF placed by radiology   -edema mgt 3. Pain Management:   Neurontin  400mg  qid with improvement and not oversedated      oxycodone as needed for pain  -continue low dose baclofen 5mg  tid, held for sedation 4. Mood:   -ongoing ego support, positive reinforcement  -neuropsych   5. Neuropsych: This patient is capable of making decisions on her own behalf. 6. Skin/Wound Care: Routine skin checks  -edema: continue to elevate limbs when possible   -CMET reviewed today    -she is eating well but will albumin still low (2.3) Prealbumin has been trending up nicely however. Continue supps 7. Fluids/Electrolytes/Nutrition:    -replacing potassium  - protein for hypoalbuminemia  -labs all stable, eating very well   8.  Neurogenic bowel and bladder.  Flomax 0.4 mg daily.    -I/O caths    -AM bowel program       -  senna-s to just senna at bedtime   -glycerin supp daily at  0600   -reiterated need for 0600 program so as to avoid conflict with therapy   -discussed with pt/daughter that small, "un-timed" bm's should happen over time as her regimen/diet/timing is fine-tuned.  9.  Hypertension.  Norvasc 10 mg daily.   Vitals:   01/06/18 1933 01/07/18 0446  BP: 131/73 119/67  Pulse: 90 86  Resp: 18 18  Temp: 99.5 F (37.5 C) 98.2 F (36.8 C)  SpO2: 92% 96%   Controlled on 12/7 10. Acute blood loss anemia:     -hgb 10.9  11/11--->10/6 11/29--> 11 12/4  -no signs of blood loss  -improving nutritional status     LOS: 31 days A FACE TO FACE EVALUATION WAS PERFORMED  Charlett Blake 01/07/2018, 1:44 PM

## 2018-01-08 ENCOUNTER — Inpatient Hospital Stay (HOSPITAL_COMMUNITY): Payer: Medicare HMO

## 2018-01-08 NOTE — Plan of Care (Signed)
  Problem: Consults Goal: RH SPINAL CORD INJURY PATIENT EDUCATION Description  See Patient Education module for education specifics.  Outcome: Progressing   Problem: RH SAFETY Goal: RH STG ADHERE TO SAFETY PRECAUTIONS W/ASSISTANCE/DEVICE Description STG Adhere to Safety Precautions With max Assistance/Device.  Outcome: Progressing   Problem: RH PAIN MANAGEMENT Goal: RH STG PAIN MANAGED AT OR BELOW PT'S PAIN GOAL Description At or below level 6  Outcome: Progressing   Problem: RH KNOWLEDGE DEFICIT SCI Goal: RH STG INCREASE KNOWLEDGE OF SELF CARE AFTER SCI Description Pt and family will be able to direct care post discharge using handout/resources independently  Outcome: Progressing

## 2018-01-08 NOTE — Progress Notes (Signed)
[facility] PHYSICAL MEDICINE & REHABILITATION PROGRESS NOTE   Subjective/Complaints: Patient stated that she had a "pity party today", she feels this way intermittently.  Daughter is not with her right now but has been present every day, we discussed her mood but at this point does not wish to try anything such as an antidepressant.  She has met with neuropsychology.  ROS: Patient denies this pain, shortness of breath, nausea vomiting diarrhea or constipation  Objective:   No results found. No results for input(s): WBC, HGB, HCT, PLT in the last 72 hours. Recent Labs    01/06/18 0502  NA 137  K 3.8  CL 101  CO2 23  GLUCOSE 100*  BUN 18  CREATININE 0.60  CALCIUM 8.3*    Intake/Output Summary (Last 24 hours) at 01/08/2018 1114 Last data filed at 01/08/2018 0900 Gross per 24 hour  Intake 600 ml  Output 1480 ml  Net -880 ml     Physical Exam: Vital Signs Blood pressure (!) 119/58, pulse (!) 101, temperature 98.8 F (37.1 C), resp. rate 16, height '5\' 7"'  (1.702 m), weight 89.7 kg, SpO2 92 %. Constitutional: No distress . Vital signs reviewed. HEENT: EOMI, oral membranes moist Neck: supple Cardiovascular: RRR without murmur. No JVD    Respiratory: CTA Bilaterally without wheezes or rales. Normal effort    GI: BS +, non-tender, non-distended   Musculoskeletal:1+l UE edema, 1++ LE edema, left more than right (LE edema increased)    Neurological: Alert and oriented.  Sensation diminished to light touch below T4, no real change.  Motor: RUE: Shoulder abduction, elbow flex 3+/5, elbow extension, wrist extension 1+ to 2-/5, hand gip 0/5--trace  Finger flexion in all 4 digits on left and index finger on R--exam stable. Right hand with brace LUE: Shoulder abduction, elbow flexion 3-/5, wrist extension 2/5, hand gip 0/5- no changes.  R LE: 1+/5 proximal to distal, LLE 2/5 prox to 2/5 distally-stable exam.  Trace bilateral hip adduction Skin: a few ecchymoses. Sacrum not  witnessed. Psychiatric: pleasant but anxious  Assessment/Plan: 1. 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  Physiatrist is providing close team supervision and 24 hour management of active medical problems listed below.  Physiatrist and rehab team continue to assess barriers to discharge/monitor patient progress toward functional and medical goals  Care Tool:  Bathing  Bathing activity did not occur: Refused Body parts bathed by patient: Right arm, Left arm, Chest, Face   Body parts bathed by helper: Abdomen, Front perineal area, Buttocks, Right upper leg, Left upper leg, Right lower leg, Left lower leg Body parts n/a: Face, Front perineal area, Buttocks(previously washed )   Bathing assist Assist Level: Moderate Assistance - Patient 50 - 74%     Upper Body Dressing/Undressing Upper body dressing   What is the patient wearing?: Pull over shirt    Upper body assist Assist Level: Maximal Assistance - Patient 25 - 49%    Lower Body Dressing/Undressing Lower body dressing      What is the patient wearing?: Pants, Incontinence brief     Lower body assist Assist for lower body dressing: Total Assistance - Patient < 25%     Toileting Toileting    Toileting assist Assist for toileting: Dependent - Patient 0%     Transfers Chair/bed transfer  Transfers assist     Chair/bed transfer assist level: 2 Helpers     Locomotion Ambulation  Ambulation assist   Ambulation activity did not occur: Safety/medical concerns          Walk 10 feet activity   Assist  Walk 10 feet activity did not occur: Safety/medical concerns        Walk 50 feet activity   Assist Walk 50 feet with 2 turns activity did not occur: Safety/medical concerns         Walk 150 feet activity   Assist Walk 150 feet activity did not occur: Safety/medical  concerns         Walk 10 feet on uneven surface  activity   Assist Walk 10 feet on uneven surfaces activity did not occur: Safety/medical concerns         Wheelchair     Assist Will patient use wheelchair at discharge?: Yes Type of Wheelchair: Power Wheelchair activity did not occur: Safety/medical concerns  Wheelchair assist level: Supervision/Verbal cueing Max wheelchair distance: 150'    Wheelchair 50 feet with 2 turns activity    Assist    Wheelchair 50 feet with 2 turns activity did not occur: Safety/medical concerns   Assist Level: Supervision/Verbal cueing   Wheelchair 150 feet activity     Assist Wheelchair 150 feet activity did not occur: Safety/medical concerns   Assist Level: Supervision/Verbal cueing     Medical Problem List and Plan: 1.  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  Status post C5-6 and C6-7 anterior cervical discectomy and fusion 11/29/2017.  TLSO back brace when out of bed     -Continue CIR therapies including PT, OT, tolerating therapies well  -working toward SNF placement 2.  DVT Prophylaxis/Anticoagulation:  Acute mobile DVT Right common/proximal femoral vein  -IVCF placed by radiology   -edema mgt 3. Pain Management:   Neurontin  471m qid with improvement and not oversedated      oxycodone as needed for pain  -continue low dose baclofen 570mtid, held for sedation 4. Mood:   -ongoing ego support, positive reinforcement  -neuropsych continues to follow, consider SSRI such as Cymbalta which may also have some pain relieving qualities. 5. Neuropsych: This patient is capable of making decisions on her own behalf. 6. Skin/Wound Care: Routine skin checks  -edema: continue to elevate limbs when possible   -CMET reviewed today    -she is eating well but will albumin still low (2.3) Prealbumin has been trending up nicely however. Continue supps 7.  Fluids/Electrolytes/Nutrition:    -replacing potassium  - protein for hypoalbuminemia  -labs all stable, eating very well   8.  Neurogenic bowel and bladder.  Flomax 0.4 mg daily.    -I/O caths    -AM bowel program       -  senna-s to just senna at bedtime   -glycerin supp daily at 0600   -reiterated need for 0600 program so as to avoid conflict with therapy   -discussed with pt/daughter that small, "un-timed" bm's should happen over time as her regimen/diet/timing is fine-tuned.  9.  Hypertension.  Norvasc 10 mg daily.   Vitals:   01/07/18 1956 01/08/18 0630  BP: 129/65 (!) 119/58  Pulse: (!) 101 (!) 101  Resp: 17 16  Temp: 99.8 F (37.7 C) 98.8 F (37.1 C)  SpO2: 92% 92%   Controlled on 12/8 10. Acute blood loss anemia:     -hgb 10.9  11/11--->10/6 11/29--> 11 12/4  -no signs of blood loss  -improving nutritional status     LOS: 32 days A  FACE TO FACE EVALUATION WAS PERFORMED  Charlett Blake 01/08/2018, 11:14 AM

## 2018-01-08 NOTE — Progress Notes (Signed)
Physical Therapy Session Note  Patient Details  Name: Sherry [name] MRN: [id number] Date of Birth: 12/30/1934  Today's Date: 01/08/2018 PT Individual Time: 0905-1000 PT Individual Time Calculation (min): 55 min   Short Term Goals: Week 5:  PT Short Term Goal 1 (Week 5): =LTG due to anticipated d/c to SNF  Skilled Therapeutic Interventions/Progress Updates:    Pt missed 5 minutes at beginning of session secondary to RN care. Pt supine in bed upon PT arrival, agreeable to therapy tx and denies pain, does report her hands feel very cold. Pt tearful at start of session, therapist providing emotional support. Therapist and tech assist pt to don teds, pants and shoes, total assist, pt rolls in both direction with max assist to pull pants over hips. Pt transferred to sitting with max assist. Pt becomes tearful again, pt reports "I just don't care anymore, I feel like no one here cares about me." Therapist continues to provide emotional support, while pt maintaining sitting balance with min assist. Pt performed slideboard transfer from bed>w/c with max assist +2, pt performed power w/c mobility with supervision to the gym. Therapist applied heat to pts hand's for sensation relief. Therapist performed manual finger, wrist and forearm stretches and PROM this session x 15 minutes. Pt propelled back to room and left with needs in reach.   Therapy Documentation Precautions:  Precautions Precautions: Cervical, Back, Fall Precaution Comments: back precautions due to thoracic fx s/p ACDF; needs TLSO for all OOB activity but per order set does not need cervical collar  Required Braces or Orthoses: Spinal Brace Spinal Brace: Thoracolumbosacral orthotic, Applied in sitting position Restrictions Weight Bearing Restrictions: No    Therapy/Group: Individual Therapy  Netta Corrigan, PT, DPT 01/08/2018, 7:43 AM

## 2018-01-08 NOTE — Plan of Care (Signed)
Goals downgraded due to slow progress. See POC for details.

## 2018-01-09 ENCOUNTER — Inpatient Hospital Stay (HOSPITAL_COMMUNITY): Payer: Medicare HMO | Admitting: Physical Therapy

## 2018-01-09 ENCOUNTER — Inpatient Hospital Stay (HOSPITAL_COMMUNITY): Payer: Medicare HMO | Admitting: Occupational Therapy

## 2018-01-09 MED ORDER — ALPRAZOLAM 0.5 MG PO TABS
0.5000 mg | ORAL_TABLET | Freq: Every day | ORAL | Status: DC
  Administered 2018-01-09 – 2018-01-13 (×5): 0.5 mg via ORAL
  Filled 2018-01-09 (×5): qty 1

## 2018-01-09 NOTE — Progress Notes (Signed)
Physical Therapy Session Note  Patient Details  Name: Sherry [name] MRN: [id number] Date of Birth: 1934-05-31  Today's Date: 01/09/2018 PT Individual Time: 0900-1000 PT Individual Time Calculation (min): 60 min   Short Term Goals: Week 5:  PT Short Term Goal 1 (Week 5): =LTG due to anticipated d/c to SNF  Skilled Therapeutic Interventions/Progress Updates:    Pt received seated in bed, agreeable to PT. Pt reports B hands feeling cold and stiff this AM, pain not rated. Rolling R/L with max A and skilled multimodal cueing for UE/LE placement during transfer to dependently don pants. Dependent to don TED hose. Supine to sit with assist x 2. Sitting balance EOB with min A, pt leans posteriorly and needs manual and verbal cues for anterior weight shift. Sliding board transfer bed to w/c with max assist x 2. Power w/c mobility x 150 ft with Supervision. PROM to B fingers and hands for stretching and contracture management. Pt has some relief of stiffness with PROM. RN requests pt back to bed at end of session in order to cath her. Sliding board transfer w/c to bed with max assist x 2. Sit to supine total assist x 2. Pt left supine in bed in care of RN.  Therapy Documentation Precautions:  Precautions Precautions: Cervical, Back, Fall Precaution Comments: back precautions due to thoracic fx s/p ACDF; needs TLSO for all OOB activity but per order set does not need cervical collar  Required Braces or Orthoses: Spinal Brace Spinal Brace: Thoracolumbosacral orthotic, Applied in sitting position Restrictions Weight Bearing Restrictions: No   Therapy/Group: Individual Therapy  Excell Seltzer, PT, DPT  01/09/2018, 12:07 PM

## 2018-01-09 NOTE — Progress Notes (Signed)
Occupational Therapy Session Note  Patient Details  Name: Sherry [name] MRN: [id number] Date of Birth: [birth year]  Today's Date: 01/09/2018 OT Individual Time: 1045-1200 and 1530-1600 OT Individual Time Calculation (min): 75 min and 30 min   Short Term Goals: Week 4:  OT Short Term Goal 1 (Week 4): STGs=LTGs due to ELOS  Skilled Therapeutic Interventions/Progress Updates:    Session 1:Upon entering the room, pt supine in bed but agreeable to OT intervention. Pt emotional as she recaps her weekend and therapy sessions since last time she worked with this therapist. Supine >sit with total A to EOB. Initially max A for static sitting balance with mod cuing and assist to find position with min A sitting balance. Lateral lean to R with min A to place slideboard. Total +2 for safety this session. Pt manipulated power chair into day room and into standing frame. Pt standing for 1 minute in frame and needing max A for forward head and proper UB position. Pt verbalizing nausea and returned to chair. PROM and massage to B hands per pt request. She also verbalized pain and pointing to quad muscle but no further information given. Focus on pursed lip breathing with min cuing as pt appearing very anxious this session. Pt returning back to room in same manner and tilting self back. Seat belt donned and heat applied to B hands. Call bell and all needed items within reach.  Session 2: Upon entering the room, pt seated in power chair with reports of fatigue but agreeable to OT intervention. Focus on B UE exercises for B shoulder shrugs, alternating punches, and shoulder circles 2 sets of 5 reps with rest breaks.  Pt needing min - mod A for midline position in wheelchair. NT arrived and asked therapist to return pt to bed for I & O cath. Pt transferred into bed with total A slide board transfer. Total A to reposition pt in bed with call bell and all needed items within reach.   Therapy Documentation Precautions:   Precautions Precautions: Cervical, Back, Fall Precaution Comments: back precautions due to thoracic fx s/p ACDF; needs TLSO for all OOB activity but per order set does not need cervical collar  Required Braces or Orthoses: Spinal Brace Spinal Brace: Thoracolumbosacral orthotic, Applied in sitting position Restrictions Weight Bearing Restrictions: No Pain: Pain Assessment Pain Scale: 0-10 Pain Score: 7  Pain Intervention(s): Medication (See eMAR) ADL: ADL Equipment Provided: Other (comment)(dorsal wrist cuff) Eating: Supervision/safety Where Assessed-Eating: Wheelchair Grooming: Not assessed Where Assessed-Grooming: Bed level Upper Body Bathing: Moderate assistance Where Assessed-Upper Body Bathing: Bed level Lower Body Bathing: Other (comment), Maximal assistance(2 helpers) Where Assessed-Lower Body Bathing: Bed level Upper Body Dressing: Maximal assistance, Other (Comment)(2 helpers) Where Assessed-Upper Body Dressing: Bed level Lower Body Dressing: Maximal assistance, Other (Comment)(2 helpers) Where Assessed-Lower Body Dressing: Bed level Toileting: Not assessed Toilet Transfer: Not assessed Walk-In Shower Transfer: Not assessed   Therapy/Group: Individual Therapy  [name] 01/09/2018, 12:17 PM

## 2018-01-09 NOTE — Progress Notes (Signed)
[facility] PHYSICAL MEDICINE & REHABILITATION PROGRESS NOTE   Subjective/Complaints: Daughter reports that pt has been more drowsy since being on baclofen and that often her processing is delayed. Asked what we should do. I asked if it was helping spasms or band-like sensations and she wasn't sure  ROS: Patient denies fever, rash, sore throat, blurred vision, nausea, vomiting, diarrhea, cough, shortness of breath or chest pain, headache, or mood change.    Objective:   No results found. No results for input(s): WBC, HGB, HCT, PLT in the last 72 hours. No results for input(s): NA, K, CL, CO2, GLUCOSE, BUN, CREATININE, CALCIUM in the last 72 hours.  Intake/Output Summary (Last 24 hours) at 01/09/2018 1233 Last data filed at 01/09/2018 1012 Gross per 24 hour  Intake 500 ml  Output 1350 ml  Net -850 ml     Physical Exam: Vital Signs Blood pressure 114/66, pulse 96, temperature 99 F (37.2 C), temperature source Oral, resp. rate 18, height 5\' 7"  (1.702 m), weight 90.2 kg, SpO2 93 %. Constitutional: No distress . Vital signs reviewed. HEENT: EOMI, oral membranes moist Neck: supple Cardiovascular: RRR without murmur. No JVD    Respiratory: CTA Bilaterally without wheezes or rales. Normal effort    GI: BS +, non-tender, non-distended   Musculoskeletal:tr  UE edema, 1++ LE edema, left more than right (LE edema increased)    Neurological: Alert but a little delayed today.  Sensation diminished to light touch below T4, no real change.  Motor: RUE: Shoulder abduction, elbow flex 3+/5, elbow extension, wrist extension 1+ to 2-/5, hand gip 0/5--trace  Finger flexion in all 4 digits on left and index finger on R--no changes today. LUE: Shoulder abduction, elbow flexion 3-/5, wrist extension 2/5, hand gip 0/5- no changes.  R LE: 1+/5 proximal to distal, LLE 2/5 prox to 2/5 distally-stable exam.  Skin:scattered bruises Psychiatric: flat  Assessment/Plan: 1. Functional deficits secondary to  C6 SCI, motor/sensory incomplete which require 3+ hours per day of interdisciplinary therapy in a comprehensive inpatient rehab setting.  Physiatrist is providing close team supervision and 24 hour management of active medical problems listed below.  Physiatrist and rehab team continue to assess barriers to discharge/monitor patient progress toward functional and medical goals  Care Tool:  Bathing  Bathing activity did not occur: Refused Body parts bathed by patient: Right arm, Left arm, Chest, Face   Body parts bathed by helper: Abdomen, Front perineal area, Buttocks, Right upper leg, Left upper leg, Right lower leg, Left lower leg Body parts n/a: Face, Front perineal area, Buttocks(previously washed )   Bathing assist Assist Level: Moderate Assistance - Patient 50 - 74%     Upper Body Dressing/Undressing Upper body dressing   What is the patient wearing?: Pull over shirt    Upper body assist Assist Level: Maximal Assistance - Patient 25 - 49%    Lower Body Dressing/Undressing Lower body dressing      What is the patient wearing?: Pants, Incontinence brief     Lower body assist Assist for lower body dressing: Total Assistance - Patient < 25%     Toileting Toileting    Toileting assist Assist for toileting: Dependent - Patient 0%     Transfers Chair/bed transfer  Transfers assist     Chair/bed transfer assist level: 2 Helpers     Locomotion Ambulation   Ambulation assist   Ambulation activity did not occur: Safety/medical concerns          Walk 10 feet activity  Assist  Walk 10 feet activity did not occur: Safety/medical concerns        Walk 50 feet activity   Assist Walk 50 feet with 2 turns activity did not occur: Safety/medical concerns         Walk 150 feet activity   Assist Walk 150 feet activity did not occur: Safety/medical concerns         Walk 10 feet on uneven surface  activity   Assist Walk 10 feet on uneven  surfaces activity did not occur: Safety/medical concerns         Wheelchair     Assist Will patient use wheelchair at discharge?: Yes Type of Wheelchair: Manual Wheelchair activity did not occur: Safety/medical concerns  Wheelchair assist level: Supervision/Verbal cueing Max wheelchair distance: 150'    Wheelchair 50 feet with 2 turns activity    Assist    Wheelchair 50 feet with 2 turns activity did not occur: Safety/medical concerns   Assist Level: Supervision/Verbal cueing   Wheelchair 150 feet activity     Assist Wheelchair 150 feet activity did not occur: Safety/medical concerns   Assist Level: Supervision/Verbal cueing     Medical Problem List and Plan: 1.  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  Status post C5-6 and C6-7 anterior cervical discectomy and fusion 11/29/2017.  TLSO back brace when out of bed     -Continue CIR therapies including PT, OT, tolerating therapies well  -SNF pending 2.  DVT Prophylaxis/Anticoagulation:  Acute mobile DVT Right common/proximal femoral vein  -IVCF placed by radiology   -edema mgt 3. Pain Management:   Neurontin  400mg  qid with improvement and not oversedated      oxycodone as needed for pain  -dc baclofen as it's been causing sedation and really hasn't helped her either. 4. Mood:   -ongoing ego support, positive reinforcement  -neuropsych continues to follow  -will discuss a trial of Cymbalta which may help with her pain too.  5. Neuropsych: This patient is capable of making decisions on her own behalf. 6. Skin/Wound Care: Routine skin checks  -edema: continue to elevate limbs when possible   - -she is eating well but will albumin still low (2.3) Prealbumin has been trending up nicely however. Continue supps 7. Fluids/Electrolytes/Nutrition:    -replacing potassium  - protein for hypoalbuminemia  -labs all stable, eating very well   8.  Neurogenic bowel  and bladder.  Flomax 0.4 mg daily.    -I/O caths    -AM bowel program       -  senna-s to just senna at bedtime   -glycerin supp daily at 0600   - 0600 program so as to avoid conflict with therapy   -better regulation  should happen over time as her regimen/diet/timing is fine-tuned.  9.  Hypertension.  Norvasc 10 mg daily.   Vitals:   01/08/18 1927 01/09/18 0341  BP: 120/62 114/66  Pulse: 97 96  Resp: 16 18  Temp: 98.5 F (36.9 C) 99 F (37.2 C)  SpO2: 93% 93%   Controlled on 12/9 10. Acute blood loss anemia:     -hgb 10.9  11/11--->10/6 11/29--> 11 12/4  -no signs of blood loss  -improving nutritional status     LOS: 33 days A FACE TO FACE EVALUATION WAS PERFORMED  Meredith Staggers 01/09/2018, 12:33 PM

## 2018-01-09 NOTE — Progress Notes (Signed)
Nutrition Follow-up  INTERVENTION:   - Continue MVI with minerals daily  - Continue Pro-stat 30 ml BID, each supplement provides 100 kcal and 15 grams of protein  - Continue afternoon snack daily  NUTRITION DIAGNOSIS:   Inadequate oral intake related to poor appetite, early satiety as evidenced by per patient/family report.  Progressing  GOAL:   Patient will meet greater than or equal to 90% of their needs  Progressing  MONITOR:   PO intake, Labs, Skin, I & O's, Weight trends  REASON FOR ASSESSMENT:   Consult Poor PO  ASSESSMENT:   83 year old female with PMH significant for hypertension. Pt presented on 11/29/2017 after a fall with immediate quadriparesis. Cranial CT scan unremarkable for acute intracranial process. CT/MRI imaging showed C6-7 disc fracture and tear of anterior longitudinal ligament. Paraspinal muscle and interspinous edema from C1-C7 compatible ligament injury and muscle strain. Increased cord signal at C5 and C6. T11 coronal vertebral body fracture. Pt underwent C5-6, C6-7 anterior cervical discectomy and fusion on 11/29/2017 per Dr. Venetia Constable.  11/7 - s/p IVC filter placement  Weight stable since 12/5, up ~20 lbs since admission. Suspect weight gain related in part to fluid status vs various items on bed when pt weighed.  Spoke with pt at bedside. Pt more subdued than during previous RD visits. Pt watching TV at time of visit.  Pt states, "you know you don't need to worry about me eating." Pt reports having soup that her daughter brought her for lunch today. Pt states that she is still drinking her shake from home and taking Pro-stat.  Pt asked RD for chocolate from [employer] in room. RD assisted pt with this.  Meal Completion: 50-100% x last 8 meals  Medications reviewed and include: Pro-stat 30 ml BID, liquid MVI, Fibercon, K-dur 20 mEq daily  Labs reviewed.  UOP: 1330 ml x 24 hours  Diet Order:   Diet Order            Diet regular  Room service appropriate? Yes; Fluid consistency: Thin  Diet effective now              EDUCATION NEEDS:   Education needs have been addressed  Skin:  Skin Assessment: Skin Integrity Issues: DTI: right heel Incisions: left groin, neck Other: laceration to left elbow  Last BM:  12/9 (medium type 6)  Height:   Ht Readings from Last 1 Encounters:  12/22/17 5\' 7"  (1.702 m)    Weight:   Wt Readings from Last 1 Encounters:  01/09/18 90.2 kg    Ideal Body Weight:  61.36 kg  BMI:  Body mass index is 31.15 kg/m.  Estimated Nutritional Needs:   Kcal:  1500-1700  Protein:  80-95 grams  Fluid:  1.5-1.7 L    Gaynell Face, MS, RD, LDN Inpatient Clinical Dietitian Pager: [phone] Weekend/After Hours: [phone]

## 2018-01-09 NOTE — Progress Notes (Signed)
Physical Therapy Session Note  Patient Details  Name: Sherry [name] MRN: [id number] Date of Birth: [birth year]  Today's Date: 01/09/2018 PT Individual Time: 1400-1500 PT Individual Time Calculation (min): 60 min   Short Term Goals: Week 5:  PT Short Term Goal 1 (Week 5): =LTG due to anticipated d/c to SNF  Skilled Therapeutic Interventions/Progress Updates: Pt received seated in w/c, c/o pain/coldness in hands and agreeable to treatment. Pt with depressed mood, flat affect during this session; therapist offered emotional support as able. Pt requesting to work on fine motor control. Engaged in linear puzzle with min cues for technique, increased time for completion and cues for sequencing/problem solving both for puzzle and for hand placement to maximize function. Returned to room totalA d/t fatigue. Alerted RN to pt request for pain medication who arrived during session to administer. Remained in w/c at end of session, k-pad applied to B hands, all needs in reach.      Therapy Documentation Precautions:  Precautions Precautions: Cervical, Back, Fall Precaution Comments: back precautions due to thoracic fx s/p ACDF; needs TLSO for all OOB activity but per order set does not need cervical collar  Required Braces or Orthoses: Spinal Brace Spinal Brace: Thoracolumbosacral orthotic, Applied in sitting position Restrictions Weight Bearing Restrictions: No    Therapy/Group: Individual Therapy  Corliss Skains 01/09/2018, 3:49 PM

## 2018-01-10 ENCOUNTER — Inpatient Hospital Stay (HOSPITAL_COMMUNITY): Payer: Medicare HMO | Admitting: Occupational Therapy

## 2018-01-10 ENCOUNTER — Inpatient Hospital Stay (HOSPITAL_COMMUNITY): Payer: Medicare HMO | Admitting: Physical Therapy

## 2018-01-10 MED ORDER — GABAPENTIN 400 MG PO CAPS
400.0000 mg | ORAL_CAPSULE | Freq: Three times a day (TID) | ORAL | Status: DC
  Administered 2018-01-10 – 2018-01-14 (×12): 400 mg via ORAL
  Filled 2018-01-10 (×12): qty 1

## 2018-01-10 MED ORDER — MUSCLE RUB 10-15 % EX CREA
TOPICAL_CREAM | CUTANEOUS | Status: DC | PRN
  Administered 2018-01-10: 10:00:00 via TOPICAL
  Filled 2018-01-10: qty 85

## 2018-01-10 MED ORDER — GABAPENTIN 600 MG PO TABS
600.0000 mg | ORAL_TABLET | Freq: Every day | ORAL | Status: DC
  Administered 2018-01-10 – 2018-01-13 (×4): 600 mg via ORAL
  Filled 2018-01-10 (×4): qty 1

## 2018-01-10 MED ORDER — DULOXETINE HCL 20 MG PO CPEP
20.0000 mg | ORAL_CAPSULE | Freq: Every day | ORAL | Status: DC
  Administered 2018-01-10 – 2018-01-19 (×10): 20 mg via ORAL
  Filled 2018-01-10 (×10): qty 1

## 2018-01-10 NOTE — Patient Care Conference (Signed)
Inpatient RehabilitationTeam Conference and Plan of Care Update Date: 01/10/2018   Time: 2:00 PM    Patient Name: Sherry [name]      Medical Record Number: [id number]  Date of Birth: Dec 17, 1934 Sex: Female         Room/Bed: 4W13C/4W13C-01 Payor Info: Payor: [employer] MEDICARE / Plan: [employer] MEDICARE HMO/PPO / Product Type: *No Product type* /    Admitting Diagnosis: Fall with SCI   central cord syndrome  Admit Date/Time:  12/07/2017  5:47 PM Admission Comments: No comment available   Primary Diagnosis:  <principal problem not specified> Principal Problem: <principal problem not specified>  Patient Active Problem List   Diagnosis Date Noted  . Reactive depression   . Hypokalemia   . DVT (deep vein thrombosis) in pregnancy   . Leukocytosis   . Essential hypertension   . Trauma   . 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([facility])   . Neuropathic pain   . Neurogenic bowel   . Neurogenic bladder   . Benign essential HTN   . Acute blood loss anemia   . Central cord syndrome at C6 level of cervical spinal cord ([place]) 11/29/2017  . Central cord syndrome [facility]) 11/29/2017    Expected Discharge Date: Expected Discharge Date: (SNF)  Team Members Present: Physician leading conference: Dr. Alger Simons Social Worker Present: Lennart Pall, LCSW Nurse Present: Dorien Chihuahua, RN PT Present: Other (comment)(Taylor Whitney Post, PT) OT Present: Darleen Crocker, OT SLP Present: Windell Moulding, SLP PPS Coordinator present : Daiva Nakayama, RN, CRRN;Melissa Bowie     Current Status/Progress Goal Weekly Team Focus  Medical   Ongoing tweaking of bowel and bladder program.  Did not tolerate baclofen due to sedation, so this was stopped  Prepare medically for discharge to SNF  See prior, pain control and medication adjustment   Bowel/Bladder   Neurogenic bowel and bladder, scheduled PVRs and Caths. Currently on bowel program. PVR-804, Cath-600 this am. Urine, amber, malodorous, cloudy, with sediment. LBM-01/09/18.   FAmily education for  care upon d/c.  Continue to assist with toiletiing needs via bowel and bladder programs.    Swallow/Nutrition/ Hydration             ADL's   total A for LB ADLs bed level, max - total +2 slide board transfers, UB self care max A, grooming and self feeding set up with u cuff  max - total A  adaptive self cares, balance, functional transfers, strengthening, pt/family education   Mobility   max A x 1-2 for bed mobility and transfers, SBA-CGA sitting balance, max A x 2 for sit to stand in standing frame, S power w/c mobility  S at power w/c level, will need to downgrade goals to max A  bed mobility, transfers, sitting balance, standing tolerance, LE NMR   Communication             Safety/Cognition/ Behavioral Observations            Pain   C/o biliateral hand and back pain, received oxycodone xx3 this shift with partial effects noted.   Maintain pain level <4/10.   Assess pain every shift and as needed. Address as needed.    Skin   MASD to perianal area, gluteal folds  No further areas of impaired skin, no s/sx of infection.   Assess skin every shift and as needed. Address appropriately.       *See Care Plan and progress notes for long and short-term goals.     Barriers to Discharge  Current Status/Progress  Possible Resolutions Date Resolved   Physician    Medical stability;Neurogenic Bowel & Bladder;Wound Care        See medical problem list      Nursing                  PT                    OT                  SLP                SW                Discharge Planning/Teaching Needs:  Plan has changed to SNF - hope to transfer this week.  NA   Team Discussion:  No significant changes medically or with therapies this week.  Pt very emotional and more depressed which seems to have affected her participation.  MD to add cymbalta.  SW continues to work with [facility] and hopes to have bed and d/c secured this week.  Revisions to Treatment Plan:  NA    Continued Need for  Acute Rehabilitation Level of Care: The patient requires daily medical management by a physician with specialized training in physical medicine and rehabilitation for the following conditions: Daily direction of a multidisciplinary physical rehabilitation program to ensure safe treatment while eliciting the highest outcome that is of practical value to the patient.: Yes Daily medical management of patient stability for increased activity during participation in an intensive rehabilitation regime.: Yes Daily analysis of laboratory values and/or radiology reports with any subsequent need for medication adjustment of medical intervention for : Neurological problems;Post surgical problems;Wound care problems;Urological problems   I attest that I was present, lead the team conference, and concur with the assessment and plan of the team.   Massiah Longanecker 01/10/2018, 4:45 PM

## 2018-01-10 NOTE — Plan of Care (Signed)
  Problem: Consults Goal: RH SPINAL CORD INJURY PATIENT EDUCATION Description  See Patient Education module for education specifics.  Outcome: Progressing   Problem: SCI BOWEL ELIMINATION Goal: RH STG MANAGE BOWEL WITH ASSISTANCE Description STG Manage Bowel with total Assistance.  Outcome: Progressing Goal: RH STG SCI MANAGE BOWEL PROGRAM W/ASSIST OR AS APPROPRIATE Description STG SCI Manage bowel program w/total assist or as appropriate.  Outcome: Progressing   Problem: SCI BLADDER ELIMINATION Goal: RH STG MANAGE BLADDER WITH MEDICATION WITH ASSISTANCE Description STG Manage Bladder With Medication With  Total Assistance.  Outcome: Progressing   Problem: RH SKIN INTEGRITY Goal: RH STG SKIN FREE OF INFECTION/BREAKDOWN Description With total assist  Outcome: Progressing Goal: RH STG MAINTAIN SKIN INTEGRITY WITH ASSISTANCE Description STG Maintain Skin Integrity With total Assistance.  Outcome: Progressing   Problem: RH SAFETY Goal: RH STG ADHERE TO SAFETY PRECAUTIONS W/ASSISTANCE/DEVICE Description STG Adhere to Safety Precautions With max Assistance/Device.  Outcome: Progressing   Problem: RH PAIN MANAGEMENT Goal: RH STG PAIN MANAGED AT OR BELOW PT'S PAIN GOAL Description At or below level 6  Outcome: Progressing   Problem: RH KNOWLEDGE DEFICIT SCI Goal: RH STG INCREASE KNOWLEDGE OF SELF CARE AFTER SCI Description Pt and family will be able to direct care post discharge using handout/resources independently  Outcome: Progressing

## 2018-01-10 NOTE — Progress Notes (Signed)
Physical Therapy Session Note  Patient Details  Name: Sherry [name] MRN: [id number] Date of Birth: 22-Jun-1934  Today's Date: 01/10/2018 PT Individual Time: 1415-1530 PT Individual Time Calculation (min): 75 min   Short Term Goals: Week 5:  PT Short Term Goal 1 (Week 5): =LTG due to anticipated d/c to SNF  Skilled Therapeutic Interventions/Progress Updates: Pt received seated in w/c, lethargic with decreased attention/initiation throughout session however unable to determine if d/t fatigue or medication side effects. Pt reports she had not eaten lunch yet. Per NT's pt not in room during meal tray delivery and tray was not saved. Therapist called in lunch order and pt agreeable to eat yogurt in the mean time. Later confirmed by OT that pt had refused lunch tray which consisted of only fruit cup as when lunch was ordered pt stated she did not want anything else. Pt fed self with universal cuff and setupA. Transfer w/c <>tilt table totalA in maximove. Tolerated 50-60 degrees without sympomatic hypotension. Performs B quad sets, small range/isometric crunches. Supine pec stretch. In 50 degrees standing on tilt table, engaged pt in UE reaching for dynavision for core activation, UE coordination/strengthening; requires modA overall d/t tremors in hands and decreased core activation/maintenance of midline with forward reaching off table. Again difficulty with concentration, pt reaching for unlighted buttons despite performing correctly previously and with cues. Returned to room totalA; remained semi-reclined in w/c, all needs in reach at completion of session.      Therapy Documentation Precautions:  Precautions Precautions: Cervical, Back, Fall Precaution Comments: back precautions due to thoracic fx s/p ACDF; needs TLSO for all OOB activity but per order set does not need cervical collar  Required Braces or Orthoses: Spinal Brace Spinal Brace: Thoracolumbosacral orthotic, Applied in sitting  position Restrictions Weight Bearing Restrictions: No    Therapy/Group: Individual Therapy  Corliss Skains 01/10/2018, 3:45 PM

## 2018-01-10 NOTE — Progress Notes (Signed)
[facility] PHYSICAL MEDICINE & REHABILITATION PROGRESS NOTE   Subjective/Complaints: Pain still an issue at night. Daughter stayed last night and didn't realize how much she struggled at night. Anxiety a factor, too. Xanax helped somewhat with that  ROS: Patient denies fever, rash, sore throat, blurred vision, nausea, vomiting, diarrhea, cough, shortness of breath or chest pain,   headache, or mood change.   Objective:   No results found. No results for input(s): WBC, HGB, HCT, PLT in the last 72 hours. No results for input(s): NA, K, CL, CO2, GLUCOSE, BUN, CREATININE, CALCIUM in the last 72 hours.  Intake/Output Summary (Last 24 hours) at 01/10/2018 0921 Last data filed at 01/10/2018 0545 Gross per 24 hour  Intake 280 ml  Output 2050 ml  Net -1770 ml     Physical Exam: Vital Signs Blood pressure (!) 125/58, pulse (!) 101, temperature 97.9 F (36.6 C), resp. rate 16, height 5\' 7"  (1.702 m), weight 90.2 kg, SpO2 92 %. Constitutional: No distress . Vital signs reviewed. HEENT: EOMI, oral membranes moist Neck: supple Cardiovascular: RRR without murmur. No JVD    Respiratory: CTA Bilaterally without wheezes or rales. Normal effort    GI: BS +, non-tender, non-distended   Musculoskeletal: no UE edema, 1++ LE edema, left more than right---near baseline    Neurological: Alert but a little delayed today.  Sensation diminished to light touch below T4, no real change.  Motor: RUE: Shoulder abduction, elbow flex 3+/5, elbow extension, wrist extension 1+ to 2-/5, hand gip 0/5--trace  Finger flexion in all 4 digits on left and index finger on R--no changes today. LUE: Shoulder abduction, elbow flexion 3-/5, wrist extension 2/5, hand gip 0/5- no motor changes.  R LE: 1+/5 proximal to distal, LLE 2/5 prox to 2/5 distally-no changes.   Skin:scattered bruises Psychiatric: flat, cooperative  Assessment/Plan: 1. Functional deficits secondary to C6 SCI, motor/sensory incomplete which require  3+ hours per day of interdisciplinary therapy in a comprehensive inpatient rehab setting.  Physiatrist is providing close team supervision and 24 hour management of active medical problems listed below.  Physiatrist and rehab team continue to assess barriers to discharge/monitor patient progress toward functional and medical goals  Care Tool:  Bathing  Bathing activity did not occur: Refused Body parts bathed by patient: Right arm, Left arm, Chest, Face   Body parts bathed by helper: Abdomen, Front perineal area, Buttocks, Right upper leg, Left upper leg, Right lower leg, Left lower leg Body parts n/a: Face, Front perineal area, Buttocks(previously washed )   Bathing assist Assist Level: Moderate Assistance - Patient 50 - 74%     Upper Body Dressing/Undressing Upper body dressing   What is the patient wearing?: Pull over shirt    Upper body assist Assist Level: Maximal Assistance - Patient 25 - 49%    Lower Body Dressing/Undressing Lower body dressing      What is the patient wearing?: Pants, Incontinence brief     Lower body assist Assist for lower body dressing: Total Assistance - Patient < 25%     Toileting Toileting    Toileting assist Assist for toileting: Dependent - Patient 0%     Transfers Chair/bed transfer  Transfers assist     Chair/bed transfer assist level: 2 Helpers     Locomotion Ambulation   Ambulation assist   Ambulation activity did not occur: Safety/medical concerns          Walk 10 feet activity   Assist  Walk 10 feet activity did not  occur: Safety/medical concerns        Walk 50 feet activity   Assist Walk 50 feet with 2 turns activity did not occur: Safety/medical concerns         Walk 150 feet activity   Assist Walk 150 feet activity did not occur: Safety/medical concerns         Walk 10 feet on uneven surface  activity   Assist Walk 10 feet on uneven surfaces activity did not occur: Safety/medical  concerns         Wheelchair     Assist Will patient use wheelchair at discharge?: Yes Type of Wheelchair: Manual Wheelchair activity did not occur: Safety/medical concerns  Wheelchair assist level: Supervision/Verbal cueing Max wheelchair distance: 150'    Wheelchair 50 feet with 2 turns activity    Assist    Wheelchair 50 feet with 2 turns activity did not occur: Safety/medical concerns   Assist Level: Supervision/Verbal cueing   Wheelchair 150 feet activity     Assist Wheelchair 150 feet activity did not occur: Safety/medical concerns   Assist Level: Supervision/Verbal cueing     Medical Problem List and Plan: 1.  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  Status post C5-6 and C6-7 anterior cervical discectomy and fusion 11/29/2017.  TLSO back brace when out of bed     -Interdisciplinary Team Conference today    -SNF pending 2.  DVT Prophylaxis/Anticoagulation:  Acute mobile DVT Right common/proximal femoral vein  -IVCF placed by radiology   -edema mgt 3. Pain Management:   Neurontin : change to 400mg  tid with meals and 600mg  at bedtime   -discussed the fact that almost anything we could add other than tylenol or nsaid is going to have sedating properties (ie baclofen). cymbalta might be an option, but will not work overnight  oxycodone as needed for pain    4. Mood:   -ongoing ego support, positive reinforcement  -neuropsych continues to follow  -will discuss a trial of Cymbalta which may help with her pain too.  5. Neuropsych: This patient is capable of making decisions on her own behalf. 6. Skin/Wound Care: Routine skin checks  -edema: continue to elevate limbs when possible   -she is eating well but will albumin still low (2.3) Prealbumin has been trending up nicely however. Continue supps 7. Fluids/Electrolytes/Nutrition:    -replacing potassium  - protein for hypoalbuminemia  -labs all stable,  eating very well   8.  Neurogenic bowel and bladder.  Flomax 0.4 mg daily.    -I/O caths    -AM bowel program       -  senna-s to just senna at bedtime   -glycerin supp daily at 0600   - 0600 program so as to avoid conflict with therapy   -better regulation  should happen over time as her regimen/diet/timing is fine-tuned.  9.  Hypertension.  Norvasc 10 mg daily.   Vitals:   01/09/18 2303 01/10/18 0447  BP: 123/67 (!) 125/58  Pulse: 95 (!) 101  Resp: 12 16  Temp: 98.3 F (36.8 C) 97.9 F (36.6 C)  SpO2: 93% 92%   Controlled on 12/10 10. Acute blood loss anemia:     -hgb 10.9  11/11--->10/6 11/29--> 11 12/4  -no signs of blood loss  -improving nutritional status     LOS: 34 days A FACE TO FACE EVALUATION WAS PERFORMED  Meredith Staggers 01/10/2018, 9:21 AM

## 2018-01-10 NOTE — Progress Notes (Signed)
Occupational Therapy Session Note  Patient Details  Name: Sherry [name] MRN: [id number] Date of Birth: 1934/07/18  Today's Date: 01/10/2018 OT Individual Time: 7858-8502 OT Individual Time Calculation (min): 73 min    Short Term Goals: Week 4:  OT Short Term Goal 1 (Week 4): STGs=LTGs due to ELOS  Skilled Therapeutic Interventions/Progress Updates:    Upon entering the room, pt supine in bed with c/o muscle pain in R shoulder. Pt requesting muscle rub and OT notified RN. Pt agreeable to OT intervention this session. B LEs with increased edema this morning and therefore B thigh high TEDs and  ACE wraps donned for management. Pt rolling L <> R with max A for LB clothing management. Dependent transfer supine >sit to EOB with max A progressing to min A for static sitting balance with cuing and manual facilitation for balance. Slide board transfer with total A to R for positioning. U cuff donned on R UE and pt brushing teeth this session with set up A. Pt remained in power chair and tilted back with heat applied to B hands and call bell within reach upon exiting the room.   Therapy Documentation Precautions:  Precautions Precautions: Cervical, Back, Fall Precaution Comments: back precautions due to thoracic fx s/p ACDF; needs TLSO for all OOB activity but per order set does not need cervical collar  Required Braces or Orthoses: Spinal Brace Spinal Brace: Thoracolumbosacral orthotic, Applied in sitting position Restrictions Weight Bearing Restrictions: No   Pain: Pain Assessment Pain Scale: 0-10 Pain Score: 5  Pain Type: Chronic pain Pain Location: Hand Pain Orientation: Right;Left Pain Descriptors / Indicators: Aching Pain Frequency: Intermittent Pain Onset: On-going Patients Stated Pain Goal: 1 Pain Intervention(s): Medication (See eMAR) Multiple Pain Sites: No ADL: ADL Equipment Provided: Other (comment)(dorsal wrist cuff) Eating: Supervision/safety Where Assessed-Eating:  Wheelchair Grooming: Not assessed Where Assessed-Grooming: Bed level Upper Body Bathing: Moderate assistance Where Assessed-Upper Body Bathing: Bed level Lower Body Bathing: Other (comment), Maximal assistance(2 helpers) Where Assessed-Lower Body Bathing: Bed level Upper Body Dressing: Maximal assistance, Other (Comment)(2 helpers) Where Assessed-Upper Body Dressing: Bed level Lower Body Dressing: Maximal assistance, Other (Comment)(2 helpers) Where Assessed-Lower Body Dressing: Bed level Toileting: Not assessed Toilet Transfer: Not assessed Walk-In Shower Transfer: Not assessed   Therapy/Group: Individual Therapy  [name] 01/10/2018, 10:19 AM

## 2018-01-10 NOTE — Progress Notes (Signed)
Occupational Therapy Session Note  Patient Details  Name: Sherry [name] MRN: [id number] Date of Birth: September 21, 1934  Today's Date: 01/10/2018 OT Individual Time: 1115-1200 OT Individual Time Calculation (min): 45 min    Short Term Goals: Week 1:  OT Short Term Goal 1 (Week 1): Pt will perform bed mobility with maxA+2 as precursor to EOB/OOB ADL. OT Short Term Goal 1 - Progress (Week 1): Met OT Short Term Goal 2 (Week 1): Pt will perform UB bathing with maxA using AE PRN.  OT Short Term Goal 2 - Progress (Week 1): Met OT Short Term Goal 3 (Week 1): Pt will don overhead shirt with maxA using compensatory strategies PRN.  OT Short Term Goal 3 - Progress (Week 1): Progressing toward goal Week 2:  OT Short Term Goal 1 (Week 2): Pt will complete 1 grooming task with AE and Min A OT Short Term Goal 1 - Progress (Week 2): Met OT Short Term Goal 2 (Week 2): Pt will complete UB dressing with Max A  OT Short Term Goal 2 - Progress (Week 2): Met OT Short Term Goal 3 (Week 2): Pts dtr will demonstrate carryover of pressure relief education by assisting pt with position changes during daily routine  OT Short Term Goal 3 - Progress (Week 2): Met Week 3:  OT Short Term Goal 1 (Week 3): Pt will perform static sitting balance on EOB for 5 minutes with max A or less. OT Short Term Goal 1 - Progress (Week 3): Progressing toward goal OT Short Term Goal 2 (Week 3): Pt will perform UB dressing with mod A. OT Short Term Goal 2 - Progress (Week 3): Progressing toward goal OT Short Term Goal 4 (Week 3): STGs=LTGS due to ELOS  Skilled Therapeutic Interventions/Progress Updates:    Pt received in bed and was agreeable to getting in the w/c. She did not want to work at the standing table as she was worried she would feel to nauseous.  Total A of 2 for supine to sit (TLSO on) and then with transfer to w/c with slide board.  Pt did place hands on board to try to assist.  Once in w/c, massage to Upper neck and  hands/ wrists.  A/arom of B arms focusing on shoulder movement in all planes and elbow flex and extension. Pt tolerated therapy well. Resting in recliner with all needs met.  Therapy Documentation Precautions:  Precautions Precautions: Cervical, Back, Fall Precaution Comments: back precautions due to thoracic fx s/p ACDF; needs TLSO for all OOB activity but per order set does not need cervical collar  Required Braces or Orthoses: Spinal Brace Spinal Brace: Thoracolumbosacral orthotic, Applied in sitting position Restrictions Weight Bearing Restrictions: No  Pain: Pain Assessment Pain Scale: 0-10 Pain Score: 5  Pain Type: Chronic pain Pain Location: Hand Pain Orientation: Right;Left Pain Descriptors / Indicators: Aching Pain Frequency: Intermittent Pain Onset: On-going Patients Stated Pain Goal: 1 Pain Intervention(s): Medication (See eMAR) Multiple Pain Sites: No    Therapy/Group: Individual Therapy  SAGUIER,JULIA 01/10/2018, 1:12 PM

## 2018-01-11 ENCOUNTER — Inpatient Hospital Stay (HOSPITAL_COMMUNITY): Payer: Medicare HMO | Admitting: Physical Therapy

## 2018-01-11 ENCOUNTER — Inpatient Hospital Stay (HOSPITAL_COMMUNITY): Payer: Medicare HMO | Admitting: Occupational Therapy

## 2018-01-11 NOTE — Progress Notes (Signed)
Occupational Therapy Session Note  Patient Details  Name: KYNLEY METZGER MRN: [id number] Date of Birth: 05/13/1934  Today's Date: 01/11/2018 OT Individual Time: 1300-1330 OT Individual Time Calculation (min): 30 min    Short Term Goals: Week 3:  OT Short Term Goal 1 (Week 3): Pt will perform static sitting balance on EOB for 5 minutes with max A or less. OT Short Term Goal 1 - Progress (Week 3): Progressing toward goal OT Short Term Goal 2 (Week 3): Pt will perform UB dressing with mod A. OT Short Term Goal 2 - Progress (Week 3): Progressing toward goal OT Short Term Goal 4 (Week 3): STGs=LTGS due to ELOS  Skilled Therapeutic Interventions/Progress Updates:    Patient seated in power TIS w/c, completed lunch with assist to set up, dorsal duff with fork - able to spear fruit and feed self, able to hold large cookie with right hand, able to hold cup with straw with bilateral UEs, min A for manipulation of small items Completed bilateral hand stretches and repetitions of OH reach Patients affect is flat today but she is willing to participate  Therapy Documentation Precautions:  Precautions Precautions: Cervical, Back, Fall Precaution Comments: back precautions due to thoracic fx s/p ACDF; needs TLSO for all OOB activity but per order set does not need cervical collar  Required Braces or Orthoses: Spinal Brace Spinal Brace: Thoracolumbosacral orthotic, Applied in sitting position Restrictions Weight Bearing Restrictions: No General:   Vital Signs:   Pain:  pain meds requested to RN, repositioning in w/c, adjusted tilt for weight shift   Therapy/Group: Individual Therapy  Carlos Levering 01/11/2018, 1:49 PM

## 2018-01-11 NOTE — Progress Notes (Signed)
Physical Therapy Session Note  Patient Details  Name: DEJANIQUE RUEHL MRN: [id number] Date of Birth: 1934-06-17  Today's Date: 01/11/2018 PT Individual Time: 1000-1100 PT Individual Time Calculation (min): 60 min   Short Term Goals: Week 5:  PT Short Term Goal 1 (Week 5): =LTG due to anticipated d/c to SNF  Skilled Therapeutic Interventions/Progress Updates:    Pt received seated in bed. Pt appears very lethargic this AM and reports that her UE limbs feel "heavy". Pt also reports a band of tightness around her chest and abdomen. Initially pt agreeable to sit up to EOB but changes her mind and requests to remain supine. Supine BP 114/66, HR 93. Pt exhibits pitting edema in B LE>UE. Supine BLE PROM in all available planes of motion to prevent contracture. BUE AAROM with PNF patterns for strengthening. Attempted to engage pt in conversation during session and she responds minimally. Pt left semi-reclined in bed with needs in reach, BLE elevated, bed alarm in place.  Therapy Documentation Precautions:  Precautions Precautions: Cervical, Back, Fall Precaution Comments: back precautions due to thoracic fx s/p ACDF; needs TLSO for all OOB activity but per order set does not need cervical collar  Required Braces or Orthoses: Spinal Brace Spinal Brace: Thoracolumbosacral orthotic, Applied in sitting position Restrictions Weight Bearing Restrictions: No   Therapy/Group: Individual Therapy  Excell Seltzer, PT, DPT  01/11/2018, 12:09 PM

## 2018-01-11 NOTE — Consult Note (Signed)
Neuropsychological Consultation   Patient:   Sherry [name]   DOB:   March 24, 1934  MR Number:  [id number]  Location:  [facility] [id number] [place] [address] Dept: [place]: [phone]           Date of Service:   01/11/2018  Start Time:   3 PM End Time:   4 PM  Provider/Observer:  Ilean Skill, Psy.D.       Clinical Neuropsychologist       Billing Code/Service: [phone] 4 Units  Chief Complaint:    Sherry [name] is an 83 year old female with a history of hypertension.  The patient presented on 11/29/2017 after a fall with immediate quadriparesis.  Cranial CT scans reviewed and were unremarkable for acute intracranial process.  CT/MRI imaging showed C6-7 disc fracture and tear of anterior longitudinal ligament.  Paraspinal muscle and interspinous edema from C1-C7 compatible with ligament injury and muscle strain.  Increased cord signal at C5 and C6.  No cord hemorrhage.  T11 coronal vertebral body fracture was noted.  The patient underwent C5-6, C6-7 anterior cervical discectomy and fusion on 11/29/2017.  The patient has had continued severe motor deficits.  The patient has begun to regain some motor movement in her arms but has significant deficits in her hands.  She has some increasing motor movement for her left leg but little movement in her right leg and foot.  The patient is having significant adjustment difficulties and is feeling very anxious, depressed and tearful with the realization of the degree of loss of functioning she is having to cope with.  12/27/2017:  The patient is starting to have more motor movement in her arm/hands (still significant impairments) and increasing movement in her left leg/foot.  She is positive about these improvements and remains motivated with progress.  01/04/2018  The patient was much more upset and depressed today.  She was crying at  times.  Upset about how the bowel program went this AM.  She stresses about it not being early enough so she can do all of her therapies.  Today, she reported that is was done different than before and parts done on bed and she experienced pain and discomfort especially in her arms while being moved around.  She reports that she was very stressed this morning and her distress continues to affect her during our session.  The patient reports that she has also worried about how much functioning she will have during the [place] season and is concerned about people having pity on her.    01/11/2018:  The patient continues with worsening depressive symptoms.  She was very tired today (it was at end of day after therapies).  Patient described some of the acute fears she has (e.g.  Head falling over while sleeping and causing issues, not getting meds in AM in time for therapy etc) as well as long-term worries/fears about dependency and family being burdened by her impairments.    Reason for Service:  The patient was referred for neuropsychological consultation due to coping and adjustment issues following a cervical central cord injury with resulting severe motor deficits.  Below is the HPI for the current admission.  HPI: Sherry [name] is an 83 year old right-handed female with history of hypertension.  Per chart review, family, and patient, lives with spouse.  Independent prior to admission.  Multilevel home with bed and bath on main level.  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  She has multiple family in the area.  Presented 11/29/2017 after a fall with immediate quadriparesis.  Cranial CT scan reviewed, unremarkable for acute intracranial process. CT/MRI imaging showed C6-7 disc fracture and tear of anterior longitudinal ligament.  Paraspinal muscle and interspinous edema from C1-C7 compatible ligament injury and muscle strain.  Increased cord signal at C5 and C6.  No cord hemorrhage  and also noted T11 coronal vertebral body fracture.  Underwent C5-6, C6-7 anterior cervical discectomy and fusion 11/29/2017 per Dr. Venetia Constable.  TLSO back brace when out of bed applied in sitting position.  Hospital course pain management.  Subcutaneous heparin for DVT prophylaxis.  Therapy evaluations completed with recommendations of physical medicine rehab consult.  Patient was admitted for a comprehensive rehab program.  Current Status:  The patient continues with worsened depressive symptoms and worries.  She has been much more focused on what has not improved and projecting that into the future vs where she was couple weeks ago.  Will need to keep focus on that.   Behavioral Observation: Sherry [name]  presents as a 83 y.o.-year-old Right Caucasian Female who appeared her stated age. her dress was Appropriate and she was Well Groomed and her manners were Appropriate to the situation.  her participation was indicative of Appropriate and Drowsy behaviors.  There were any physical disabilities noted.  she displayed an appropriate level of cooperation and motivation.     Interactions:    Active Appropriate and Drowsy  Attention:   within normal limits and attention span and concentration were age appropriate  Memory:   within normal limits; recent and remote memory intact  Visuo-spatial:  not examined  Speech (Volume):  low  Speech:   normal; normal  Thought Process:  Coherent and Relevant  Though Content:  WNL; not suicidal and not homicidal  Orientation:   person, place, time/date and situation  Judgment:   Good  Planning:   Good  Affect:    Anxious  Mood:    Dysphoric  Insight:   Good  Intelligence:   high  Medical History:   Past Medical History:  Diagnosis Date  . Arthritis   . Cancer ([place])    skin  . Hypertension         Abuse/Trauma History: The patient recently had a traumatic fall where she suffered cervical central cord injury resulting in severe motor  deficits from her neck down.  Psychiatric History:  The patient denies any prior psychiatric history.  Family Med/Psych History:  Family History  Problem Relation Age of Onset  . Breast cancer Maternal Aunt 75    Risk of Suicide/Violence: low  Patient reports that mood is significantly improved.  Impression/DX:  Sherry [name] is an 82 year old female with a history of hypertension.  The patient presented on 11/29/2017 after a fall with immediate quadriparesis.  Cranial CT scans reviewed and were unremarkable for acute intracranial process.  CT/MRI imaging showed C6-7 disc fracture and tear of anterior longitudinal ligament.  Paraspinal muscle and interspinous edema from C1-C7 compatible with ligament injury and muscle strain.  Increased cord signal at C5 and C6.  No cord hemorrhage.  T11 coronal vertebral body fracture was noted.  The patient underwent C5-6, C6-7 anterior cervical discectomy and fusion on 11/29/2017.  The patient has had continued severe motor deficits.  The patient has begun to regain some motor movement in her arms but has significant deficits in her hands.  She  has some increasing motor movement for her left leg but little movement in her right leg and foot.  The patient is having significant adjustment difficulties and is feeling very anxious, depressed and tearful with the realization of the degree of loss of functioning she is having to cope with.  01/04/2018  The patient was much more upset and depressed today.  She was crying at times.  Upset about how the bowel program went this AM.  She stresses about it not being early enough so she can do all of her therapies.  Today, she reported that is was done different than before and parts done on bed and she experienced pain and discomfort especially in her arms while being moved around.  She reports that she was very stressed this morning and her distress continues to affect her during our session.  The patient reports that she  has also worried about how much functioning she will have during the [place] season and is concerned about people having pity on her.    01/11/2018:  The patient continues with worsening depressive symptoms.  She was very tired today (it was at end of day after therapies).  Patient described some of the acute fears she has (e.g.  Head falling over while sleeping and causing issues, not getting meds in AM in time for therapy etc) as well as long-term worries/fears about dependency and family being burdened by her impairments.     Disposition/Plan:  I will follow-up with the patient first of next week to continue to assess adjustment and coping.           Electronically Signed   _______________________ Ilean Skill, Psy.D.

## 2018-01-11 NOTE — Progress Notes (Signed)
Occupational Therapy Session Note  Patient Details  Name: Sherry [name] MRN: [id number] Date of Birth: 1934/12/19  Today's Date: 01/11/2018 OT Individual Time: 8756-4332 OT Individual Time Calculation (min): 38 min  and Today's Date: 01/11/2018 OT Missed Time:  40 min  Missed Time Reason:   fatigue and pt not feeling well   Short Term Goals: Week 4:  OT Short Term Goal 1 (Week 4): STGs=LTGs due to ELOS  Skilled Therapeutic Interventions/Progress Updates:    Upon entering the room, pt sitting up in bed with RN present and getting medications. Pt also not finishing breakfast but declined assistance with set up to finish. Pt verbalized "band" of pain around abdomen, thighs, and feet. RN notified. OT attempted repositioning for comfort and heat applied to hands. Pt declined OT intervention this session even with encouragement. Pt declined bd activities as well and requesting to rest. Call bell and all needed items within reach upon exiting the room.   Therapy Documentation Precautions:  Precautions Precautions: Cervical, Back, Fall Precaution Comments: back precautions due to thoracic fx s/p ACDF; needs TLSO for all OOB activity but per order set does not need cervical collar  Required Braces or Orthoses: Spinal Brace Spinal Brace: Thoracolumbosacral orthotic, Applied in sitting position Restrictions Weight Bearing Restrictions: No General:   Vital Signs: Therapy Vitals Temp: 97.8 F (36.6 C) Temp Source: Oral Pulse Rate: 96 Resp: 18 BP: 117/64 Patient Position (if appropriate): Lying Oxygen Therapy SpO2: 93 % O2 Device: Room Air Pain: Pain Assessment Pain Scale: 0-10 Pain Score: 7  Pain Type: Acute pain Pain Location: Hand Pain Orientation: Right;Left Pain Radiating Towards: arms and neck Pain Descriptors / Indicators: Aching;Discomfort Pain Frequency: Intermittent Pain Onset: Gradual Patients Stated Pain Goal: 2 Pain Intervention(s): Medication (See  eMAR) Multiple Pain Sites: No ADL: ADL Equipment Provided: Other (comment)(dorsal wrist cuff) Eating: Supervision/safety Where Assessed-Eating: Wheelchair Grooming: Not assessed Where Assessed-Grooming: Bed level Upper Body Bathing: Moderate assistance Where Assessed-Upper Body Bathing: Bed level Lower Body Bathing: Other (comment), Maximal assistance(2 helpers) Where Assessed-Lower Body Bathing: Bed level Upper Body Dressing: Maximal assistance, Other (Comment)(2 helpers) Where Assessed-Upper Body Dressing: Bed level Lower Body Dressing: Maximal assistance, Other (Comment)(2 helpers) Where Assessed-Lower Body Dressing: Bed level Toileting: Not assessed Toilet Transfer: Not assessed Walk-In Shower Transfer: Not assessed   Therapy/Group: Individual Therapy  [name] 01/11/2018, 9:08 AM

## 2018-01-11 NOTE — Progress Notes (Signed)
[facility] PHYSICAL MEDICINE & REHABILITATION PROGRESS NOTE   Subjective/Complaints: Seems to have slept better last night. Pt states she did better with her pain.   ROS: Patient denies fever, rash, sore throat, blurred vision, nausea, vomiting, diarrhea, cough, shortness of breath or chest pain,   Headache    Objective:   No results found. No results for input(s): WBC, HGB, HCT, PLT in the last 72 hours. No results for input(s): NA, K, CL, CO2, GLUCOSE, BUN, CREATININE, CALCIUM in the last 72 hours.  Intake/Output Summary (Last 24 hours) at 01/11/2018 1235 Last data filed at 01/11/2018 0945 Gross per 24 hour  Intake 480 ml  Output 1150 ml  Net -670 ml     Physical Exam: Vital Signs Blood pressure 117/64, pulse 96, temperature 97.8 F (36.6 C), resp. rate 18, height 5\' 7"  (1.702 m), weight 92.8 kg, SpO2 93 %. Constitutional: No distress . Vital signs reviewed. HEENT: EOMI, oral membranes moist Neck: supple Cardiovascular: RRR without murmur. No JVD    Respiratory: CTA Bilaterally without wheezes or rales. Normal effort    GI: BS +, non-tender, non-distended   Musculoskeletal: no UE edema, 1++ LE edema, left more than right---near baseline    Neurological: Alert but a little delayed today.  Sensation diminished to light touch below T4, no real change.  Motor: RUE: Shoulder abduction, elbow flex 3+/5, elbow extension, wrist extension 1+ to 2-/5, hand gip 0/5--trace  Finger flexion in all 4 digits on left and index finger on R--no obvious changes. LUE: Shoulder abduction, elbow flexion 3-/5, wrist extension 2/5, hand gip 0/5- no motor changes.  R LE: 1+/5 proximal to distal, LLE 2/5 prox to 2/5 distally- stable exam.   Skin:scattered bruises Psychiatric: mood is more up beat today  Assessment/Plan: 1. Functional deficits secondary to C6 SCI, motor/sensory incomplete which require 3+ hours per day of interdisciplinary therapy in a comprehensive inpatient rehab  setting.  Physiatrist is providing close team supervision and 24 hour management of active medical problems listed below.  Physiatrist and rehab team continue to assess barriers to discharge/monitor patient progress toward functional and medical goals  Care Tool:  Bathing  Bathing activity did not occur: Refused Body parts bathed by patient: Right arm, Left arm, Chest, Face   Body parts bathed by helper: Abdomen, Front perineal area, Buttocks, Right upper leg, Left upper leg, Right lower leg, Left lower leg Body parts n/a: Face, Front perineal area, Buttocks(previously washed )   Bathing assist Assist Level: Moderate Assistance - Patient 50 - 74%     Upper Body Dressing/Undressing Upper body dressing   What is the patient wearing?: Pull over shirt    Upper body assist Assist Level: Maximal Assistance - Patient 25 - 49%    Lower Body Dressing/Undressing Lower body dressing      What is the patient wearing?: Pants     Lower body assist Assist for lower body dressing: Total Assistance - Patient < 25%     Toileting Toileting    Toileting assist Assist for toileting: Dependent - Patient 0%     Transfers Chair/bed transfer  Transfers assist     Chair/bed transfer assist level: 2 Helpers     Locomotion Ambulation   Ambulation assist   Ambulation activity did not occur: Safety/medical concerns          Walk 10 feet activity   Assist  Walk 10 feet activity did not occur: Safety/medical concerns        Walk 50 feet  activity   Assist Walk 50 feet with 2 turns activity did not occur: Safety/medical concerns         Walk 150 feet activity   Assist Walk 150 feet activity did not occur: Safety/medical concerns         Walk 10 feet on uneven surface  activity   Assist Walk 10 feet on uneven surfaces activity did not occur: Safety/medical concerns         Wheelchair     Assist Will patient use wheelchair at discharge?: Yes Type of  Wheelchair: Power Wheelchair activity did not occur: Safety/medical concerns  Wheelchair assist level: Minimal Assistance - Patient > 75% Max wheelchair distance: 150'    Wheelchair 50 feet with 2 turns activity    Assist    Wheelchair 50 feet with 2 turns activity did not occur: Safety/medical concerns   Assist Level: Minimal Assistance - Patient > 75%   Wheelchair 150 feet activity     Assist Wheelchair 150 feet activity did not occur: Safety/medical concerns   Assist Level: Minimal Assistance - Patient > 75%     Medical Problem List and Plan: 1.  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  Status post C5-6 and C6-7 anterior cervical discectomy and fusion 11/29/2017.  TLSO back brace when out of bed     -Continue CIR therapies including PT, OT   -SNF pending 2.  DVT Prophylaxis/Anticoagulation:  Acute mobile DVT Right common/proximal femoral vein  -IVCF placed by radiology   -edema mgt 3. Pain Management:   Neurontin : change to 400mg  tid with meals and 600mg  at bedtime   initiated cymbalta yesterday  oxycodone as needed for pain   had a better night 4. Mood:   -ongoing ego support, positive reinforcement  -neuropsych continues to follow  - trial of Cymbalta which may help with her pain too.  5. Neuropsych: This patient is capable of making decisions on her own behalf. 6. Skin/Wound Care: Routine skin checks  -edema: continue to elevate limbs when possible  -nutritionally much improved 7. Fluids/Electrolytes/Nutrition:    -replacing potassium  - protein for hypoalbuminemia  -labs all stable, eating very well   8.  Neurogenic bowel and bladder.  Flomax 0.4 mg daily.    -I/O caths    -AM bowel program       -  senna-s to just senna at bedtime   -glycerin supp daily at 0600   - 0600 program so as to avoid conflict with therapy   -better regulation  should happen over time as her regimen/diet/timing is  fine-tuned.  9.  Hypertension.  Norvasc 10 mg daily.   Vitals:   01/11/18 0552 01/11/18 0602  BP: 119/67 117/64  Pulse: 88 96  Resp: 19 18  Temp: 98.3 F (36.8 C) 97.8 F (36.6 C)  SpO2: 95% 93%   Controlled on 12/11 10. Acute blood loss anemia:     -hgb 10.9  11/11--->10/6 11/29--> 11 12/4  -no signs of blood loss       LOS: 35 days A FACE TO FACE EVALUATION WAS PERFORMED  Meredith Staggers 01/11/2018, 12:35 PM

## 2018-01-11 NOTE — Progress Notes (Signed)
Physical Therapy Session Note  Patient Details  Name: Sherry [name] MRN: [id number] Date of Birth: 01-May-1934  Today's Date: 01/11/2018 PT Individual Time: 1115-1215 PT Individual Time Calculation (min): 60 min   Short Term Goals: Week 5:  PT Short Term Goal 1 (Week 5): =LTG due to anticipated d/c to SNF  Skilled Therapeutic Interventions/Progress Updates: Pt received in bed, c/o "the usual" pain in B hands 4/10 and RN present to administer medication. B TEDs and pants donned totalA; pt assisted with rolling R/L to pull up pants. Supine>sit with HOB elevated, totalA. Lateral scoot transfer bed>w/c with maxA, improving forward weight shift and trace LE activation to assist, +2 for stabilizing equipment. Pt and therapist maneuvered power w/c throughout hospital, indoors/outdoors on level/unlevel surfaces including curb cuts. Pt maneuvered power chair through gift shop with minA and verbal cues for safety and awareness of size of w/c in crowded environment. Pt able to place snack on/off counter at [employer] with increased time d/t poor fine motor control. Returned to room and remained semi-reclined in w/c with LEs elevated, seatbelt intact and all needs in reach.      Therapy Documentation Precautions:  Precautions Precautions: Cervical, Back, Fall Precaution Comments: back precautions due to thoracic fx s/p ACDF; needs TLSO for all OOB activity but per order set does not need cervical collar  Required Braces or Orthoses: Spinal Brace Spinal Brace: Thoracolumbosacral orthotic, Applied in sitting position Restrictions Weight Bearing Restrictions: No    Therapy/Group: Individual Therapy  Corliss Skains 01/11/2018, 12:19 PM

## 2018-01-12 ENCOUNTER — Inpatient Hospital Stay (HOSPITAL_COMMUNITY): Payer: Medicare HMO | Admitting: Occupational Therapy

## 2018-01-12 ENCOUNTER — Inpatient Hospital Stay (HOSPITAL_COMMUNITY): Payer: Medicare HMO | Admitting: Physical Therapy

## 2018-01-12 ENCOUNTER — Inpatient Hospital Stay (HOSPITAL_COMMUNITY): Payer: Medicare HMO

## 2018-01-12 NOTE — Progress Notes (Signed)
Physical Therapy Session Note  Patient Details  Name: Sherry [name] MRN: [id number] Date of Birth: 10-01-1934  Today's Date: 01/12/2018 PT Individual Time: 1300-1350 PT Individual Time Calculation (min): 50 min   Short Term Goals:  Week 5:  PT Short Term Goal 1 (Week 5): =LTG due to anticipated d/c to SNF  Skilled Therapeutic Interventions/Progress Updates:   Pt sitting up in w/c, appearing ill, lethargic.  Diaphoretic, flushed cheeks; pitting edema bil LEs.  PT consulted Dione Plover, RN.  She is aware pt appears ill  Pt unable to drive w/c in congested area of room without running into bed, etc, on slowest speed.  On straight path in hall, pt able to drive power w/c x 25' with supervision.  Kinetron in sitting in w/c, for bil LE alternating reciprocal movement to elicit LLE movement in mass extension pattern.  Pt unable to activate motors in LLE although noted to be trying.  BP 144/70; HR 96, O2 sats 92% on room air.  Pt disoriented, stating that it is November and Tuesday.  She became tearful when PT gently informed her.  She was correct on year.  PT Informed [name], Therapist, sports.  [facility] Board transfer +2 level transfer back to bed.   Pt left resting with HOB elevated, soft call bell in reach and bed alarm set.     Therapy Documentation Precautions:  Precautions Precautions: Cervical, Back, Fall Precaution Comments: back precautions due to thoracic fx s/p ACDF; needs TLSO for all OOB activity but per order set does not need cervical collar  Required Braces or Orthoses: Spinal Brace Spinal Brace: Thoracolumbosacral orthotic, Applied in sitting position Restrictions Weight Bearing Restrictions: No   Pain: bil hands, unrated.  "I always hurt.  I don't know if I have had any pain medicine today."  PT placed pt's hand socks on pt's hands, as per routine for pt.     Therapy/Group: Individual Therapy  Sherry [name] 01/12/2018, 3:12 PM

## 2018-01-12 NOTE — Progress Notes (Signed)
Social Work Patient ID: Sherry [name], female   DOB: 1934-03-01, 83 y.o.   MRN: [id number]  Have reviewed team conference with pt and daughter.  Have spoken with admissions coordinator at [facility] and they are anticipating being able to admit pt there next Tuesday 12/17 and pt/ daughter aware and agreeable.  They are also well aware that insurance coverage for CIR ended on 01/07/18.  Working with [place] to coordinating having a rental power w/c in place for pt to use at SNF.  Tx team aware.  Francoise Chojnowski, LCSW

## 2018-01-12 NOTE — Progress Notes (Signed)
Physical Therapy Session Note  Patient Details  Name: Sherry [name] MRN: [id number] Date of Birth: 1934/08/23  Today's Date: 01/12/2018 PT Individual Time: 0930-1000 and 1130-1200 PT Individual Time Calculation (min): 30 min and 30 min (total 60 min)   Short Term Goals: Week 5:  PT Short Term Goal 1 (Week 5): =LTG due to anticipated d/c to SNF  Skilled Therapeutic Interventions/Progress Updates: Tx 1: Pt received seated in w/c, denies pain and agreeable to treatment. Repositioned pt in chair totalA. W/c management throughout unit and with large obstacles performing figure 8 with obstacles gradually moved in closer together to increase challenge. Pt reports difficulty concentrating this session and requires occasional minA to remove hand from joystick before hitting obstacles/walls. Returned to room and remained reclined in power chair; seatbelt intact and all needs in reach at end of session.  Tx 2: Pt missed 30 min PT d/t nursing care. Supine>sit maxA. Lateral scoot bed>nustep>w/c with transfer board and maxA +2. Performed BUE/BLE nustep with positioning assist devices to maintaining LE alignment. Able to demo AROM LLE/RUE extension, requires assist for RLE extension d/t strength deficits. Remained semi-reclined in w/c at end of session, seat belt intact, all needs in reach.      Therapy Documentation Precautions:  Precautions Precautions: Cervical, Back, Fall Precaution Comments: back precautions due to thoracic fx s/p ACDF; needs TLSO for all OOB activity but per order set does not need cervical collar  Required Braces or Orthoses: Spinal Brace Spinal Brace: Thoracolumbosacral orthotic, Applied in sitting position Restrictions Weight Bearing Restrictions: No General: PT Amount of Missed Time (min): 30 Minutes PT Missed Treatment Reason: Nursing care    Therapy/Group: Individual Therapy  Corliss Skains 01/12/2018, 12:49 PM

## 2018-01-12 NOTE — Plan of Care (Signed)
  Problem: RH Bed Mobility Goal: LTG Patient will perform bed mobility with assist (PT) Description LTG: Patient will perform bed mobility with assistance, with/without cues (PT). Flowsheets (Taken 01/12/2018 1244) LTG: Pt will perform bed mobility with assistance level of: Maximal Assistance - Patient 25 - 49% (downgraded d/t slow progress) Note:  Downgraded d/t slow progress   Problem: RH Bed to Chair Transfers Goal: LTG Patient will perform bed/chair transfers w/assist (PT) Description LTG: Patient will perform bed to chair transfers with assistance (PT). Flowsheets (Taken 01/12/2018 1244) LTG: Pt will perform Bed to Chair Transfers with assistance level  : Maximal Assistance - Patient 25 - 49% (downgraded d/t slow progress) Note:  Downgraded d/t slow progress

## 2018-01-12 NOTE — Progress Notes (Signed)
Occupational Therapy Weekly Progress Note  Patient Details  Name: REIS PIENTA MRN: [id number] Date of Birth: 02-Aug-1934  Beginning of progress report period: January 02, 2018 End of progress report period: January 12, 2018  Today's Date: 01/12/2018 OT Individual Time: 5397-6734 OT Individual Time Calculation (min): 72 min    Patient has met 2 of 9 long term goals.  Short term goals not set due to upcoming discharge to SNF. Pt appearing to be more emotional this week and increasing c/o anxiety and pain in various locations making it difficulty to continue to progress therapy. Pt continues to be agreeable to OT intervention. Pt continues to be on night bath and LB dressing performed supine in bed with +2 assistance. UB dressing max - total A. Sitting balance and functional transfers vary depending on pt's fatigue but can be max - total A of 2. Pt needs continued OT intervention to address functional deficits.   Patient continues to demonstrate the following deficits: muscle weakness, decreased cardiorespiratoy endurance, decreased coordination and decreased motor planning and decreased sitting balance and decreased balance strategies, quadraplegia and therefore will continue to benefit from skilled OT intervention to enhance overall performance with Reduce care partner burden.  See Patient's Care Plan for progression toward long term goals.  Patient progressing toward long term goals..  Continue plan of care.  Skilled Therapeutic Interventions/Progress Updates:    Upon entering the room, pt supine in bed with c/o pain in neck. RN notified and neck pillow applied per pt request. Total  Of 2 to don B TEDs and pants with max A to roll L <> R. Supine >sit with total A of 1. Pt needing max A for sitting balance on EOB. Slide board transfer from bed >wheelchair with +2 assistance for safety. Pt needing cuing to position wheelchair for meal and not really attending to task. OT set up breakfast tray  and applied universal cuff. Pt feeding self meal with set up A. Call bells and all needed items within reach upon exiting the room.   Therapy Documentation Precautions:  Precautions Precautions: Cervical, Back, Fall Precaution Comments: back precautions due to thoracic fx s/p ACDF; needs TLSO for all OOB activity but per order set does not need cervical collar  Required Braces or Orthoses: Spinal Brace Spinal Brace: Thoracolumbosacral orthotic, Applied in sitting position Restrictions Weight Bearing Restrictions: No   Pain: Pain Assessment Pain Score: 7  ADL: ADL Equipment Provided: Other (comment)(dorsal wrist cuff) Eating: Supervision/safety Where Assessed-Eating: Wheelchair Grooming: Not assessed Where Assessed-Grooming: Bed level Upper Body Bathing: Moderate assistance Where Assessed-Upper Body Bathing: Bed level Lower Body Bathing: Other (comment), Maximal assistance(2 helpers) Where Assessed-Lower Body Bathing: Bed level Upper Body Dressing: Maximal assistance, Other (Comment)(2 helpers) Where Assessed-Upper Body Dressing: Bed level Lower Body Dressing: Maximal assistance, Other (Comment)(2 helpers) Where Assessed-Lower Body Dressing: Bed level Toileting: Not assessed Toilet Transfer: Not assessed Walk-In Shower Transfer: Not assessed   Therapy/Group: Individual Therapy  [name] 01/12/2018, 9:52 AM

## 2018-01-12 NOTE — Progress Notes (Signed)
[place] PHYSICAL MEDICINE & REHABILITATION PROGRESS NOTE   Subjective/Complaints: Having better nights. Gabapentin and xanax helpful   ROS: Patient denies fever, rash, sore throat, blurred vision, nausea, vomiting, diarrhea, cough, shortness of breath or chest pain, joint or back pain, headache, or mood change.     Objective:   No results found. No results for input(s): WBC, HGB, HCT, PLT in the last 72 hours. No results for input(s): NA, K, CL, CO2, GLUCOSE, BUN, CREATININE, CALCIUM in the last 72 hours.  Intake/Output Summary (Last 24 hours) at 01/12/2018 1306 Last data filed at 01/12/2018 1302 Gross per 24 hour  Intake 480 ml  Output 1900 ml  Net -1420 ml     Physical Exam: Vital Signs Blood pressure (!) 109/59, pulse 91, temperature 98.2 F (36.8 C), temperature source Oral, resp. rate 16, height 5\' 7"  (1.702 m), weight 91.2 kg, SpO2 92 %. Constitutional: No distress . Vital signs reviewed. HEENT: EOMI, oral membranes moist Neck: supple Cardiovascular: RRR without murmur. No JVD    Respiratory: CTA Bilaterally without wheezes or rales. Normal effort    GI: BS +, non-tender, non-distended  Musculoskeletal: no UE edema, 1++ LE edema, left more than right---near baseline    Neurological: Alert but a little delayed today.  Sensation diminished to light touch below T4, no real change.  Motor: RUE: Shoulder abduction, elbow flex 3+/5, elbow extension, wrist extension 1+ to 2-/5, hand gip 0/5--trace  Finger flexion in all 4 digits on left and index finger on R--no obvious changes. LUE: Shoulder abduction, elbow flexion 3-/5, wrist extension 2/5, hand gip 0/5- no motor changes.  R LE: 1+/5 proximal to distal, LLE 2/5 prox to 2/5 distally- stable exam.   Skin:scattered bruises Psychiatric: pleasant and up beat  Assessment/Plan: 1. Functional deficits secondary to C6 SCI, motor/sensory incomplete which require 3+ hours per day of interdisciplinary therapy in a comprehensive  inpatient rehab setting.  Physiatrist is providing close team supervision and 24 hour management of active medical problems listed below.  Physiatrist and rehab team continue to assess barriers to discharge/monitor patient progress toward functional and medical goals  Care Tool:  Bathing  Bathing activity did not occur: Refused Body parts bathed by patient: Right arm, Left arm, Chest, Face   Body parts bathed by helper: Abdomen, Front perineal area, Buttocks, Right upper leg, Left upper leg, Right lower leg, Left lower leg Body parts n/a: Face, Front perineal area, Buttocks(previously washed )   Bathing assist Assist Level: Moderate Assistance - Patient 50 - 74%     Upper Body Dressing/Undressing Upper body dressing   What is the patient wearing?: Pull over shirt    Upper body assist Assist Level: Maximal Assistance - Patient 25 - 49%    Lower Body Dressing/Undressing Lower body dressing      What is the patient wearing?: Pants     Lower body assist Assist for lower body dressing: Total Assistance - Patient < 25%     Toileting Toileting    Toileting assist Assist for toileting: Dependent - Patient 0%     Transfers Chair/bed transfer  Transfers assist     Chair/bed transfer assist level: 2 Helpers     Locomotion Ambulation   Ambulation assist   Ambulation activity did not occur: Safety/medical concerns          Walk 10 feet activity   Assist  Walk 10 feet activity did not occur: Safety/medical concerns        Walk 50 feet activity  Assist Walk 50 feet with 2 turns activity did not occur: Safety/medical concerns         Walk 150 feet activity   Assist Walk 150 feet activity did not occur: Safety/medical concerns         Walk 10 feet on uneven surface  activity   Assist Walk 10 feet on uneven surfaces activity did not occur: Safety/medical concerns         Wheelchair     Assist Will patient use wheelchair at  discharge?: Yes Type of Wheelchair: Power Wheelchair activity did not occur: Safety/medical concerns  Wheelchair assist level: Minimal Assistance - Patient > 75% Max wheelchair distance: 150'    Wheelchair 50 feet with 2 turns activity    Assist    Wheelchair 50 feet with 2 turns activity did not occur: Safety/medical concerns   Assist Level: Minimal Assistance - Patient > 75%   Wheelchair 150 feet activity     Assist Wheelchair 150 feet activity did not occur: Safety/medical concerns   Assist Level: Minimal Assistance - Patient > 75%     Medical Problem List and Plan: 1.  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  Status post C5-6 and C6-7 anterior cervical discectomy and fusion 11/29/2017.  TLSO back brace when out of bed     -Continue CIR therapies including PT, OT   -SNF pending on Tuesday 2.  DVT Prophylaxis/Anticoagulation:  Acute mobile DVT Right common/proximal femoral vein  -IVCF placed by radiology   -edema mgt 3. Pain Management:   Neurontin : change to 400mg  tid with meals and 600mg  at bedtime   initiated cymbalta   oxycodone as needed for pain   had a better night 4. Mood:   -ongoing ego support, positive reinforcement  -neuropsych continues to follow  - trial of Cymbalta which may help with her pain too.  5. Neuropsych: This patient is capable of making decisions on her own behalf. 6. Skin/Wound Care: Routine skin checks  -edema: continue to elevate limbs when possible  -nutritionally much improved 7. Fluids/Electrolytes/Nutrition:    -replacing potassium  - protein for hypoalbuminemia  -labs all stable, eating very well   8.  Neurogenic bowel and bladder.  Flomax 0.4 mg daily.    -I/O caths    -AM bowel program       -  senna-s to just senna at bedtime   -glycerin supp daily at 0600   - 0600 program so as to avoid conflict with therapy   -better regulation  should happen over time as her  regimen/diet/timing is fine-tuned.  9.  Hypertension.  Norvasc 10 mg daily.   Vitals:   01/11/18 1945 01/12/18 0414  BP: 126/62 (!) 109/59  Pulse: 94 91  Resp: 16 16  Temp: 97.6 F (36.4 C) 98.2 F (36.8 C)  SpO2: 93% 92%   Controlled on 12/12 10. Acute blood loss anemia:     -hgb 10.9  11/11--->10/6 11/29--> 11 12/4  -no signs of blood loss       LOS: 36 days A FACE TO FACE EVALUATION WAS PERFORMED  Meredith Staggers 01/12/2018, 1:06 PM

## 2018-01-12 NOTE — Plan of Care (Signed)
  Problem: SCI BOWEL ELIMINATION Goal: RH STG SCI MANAGE BOWEL PROGRAM W/ASSIST OR AS APPROPRIATE Description STG SCI Manage bowel program w/total assist or as appropriate.  Outcome: Progressing  Bowel Program every am

## 2018-01-13 ENCOUNTER — Inpatient Hospital Stay (HOSPITAL_COMMUNITY): Payer: Medicare HMO | Admitting: Occupational Therapy

## 2018-01-13 ENCOUNTER — Inpatient Hospital Stay (HOSPITAL_COMMUNITY): Payer: Medicare HMO | Admitting: Physical Therapy

## 2018-01-13 NOTE — Plan of Care (Signed)
  Problem: Consults Goal: RH SPINAL CORD INJURY PATIENT EDUCATION Description  See Patient Education module for education specifics.  Outcome: Progressing   Problem: SCI BOWEL ELIMINATION Goal: RH STG MANAGE BOWEL WITH ASSISTANCE Description STG Manage Bowel with total Assistance.  Outcome: Progressing Goal: RH STG SCI MANAGE BOWEL PROGRAM W/ASSIST OR AS APPROPRIATE Description STG SCI Manage bowel program w/total assist or as appropriate.  Outcome: Progressing   Problem: SCI BLADDER ELIMINATION Goal: RH STG MANAGE BLADDER WITH MEDICATION WITH ASSISTANCE Description STG Manage Bladder With Medication With  Total Assistance.  Outcome: Progressing   Problem: RH SKIN INTEGRITY Goal: RH STG SKIN FREE OF INFECTION/BREAKDOWN Description With total assist  Outcome: Progressing Goal: RH STG MAINTAIN SKIN INTEGRITY WITH ASSISTANCE Description STG Maintain Skin Integrity With total Assistance.  Outcome: Progressing   Problem: RH SAFETY Goal: RH STG ADHERE TO SAFETY PRECAUTIONS W/ASSISTANCE/DEVICE Description STG Adhere to Safety Precautions With max Assistance/Device.  Outcome: Progressing   Problem: RH PAIN MANAGEMENT Goal: RH STG PAIN MANAGED AT OR BELOW PT'S PAIN GOAL Description At or below level 6  Outcome: Progressing   Problem: RH KNOWLEDGE DEFICIT SCI Goal: RH STG INCREASE KNOWLEDGE OF SELF CARE AFTER SCI Description Pt and family will be able to direct care post discharge using handout/resources independently  Outcome: Progressing

## 2018-01-13 NOTE — Progress Notes (Signed)
[place] PHYSICAL MEDICINE & REHABILITATION PROGRESS NOTE   Subjective/Complaints: Sometimes a struggle to keep up with pain. Overall nights have been better. Daughter concerned about her mood  ROS: Patient denies fever, rash, sore throat, blurred vision, nausea, vomiting, diarrhea, cough, shortness of breath or chest pain, joint or back pain, headache, or mood change.      Objective:   No results found. No results for input(s): WBC, HGB, HCT, PLT in the last 72 hours. No results for input(s): NA, K, CL, CO2, GLUCOSE, BUN, CREATININE, CALCIUM in the last 72 hours.  Intake/Output Summary (Last 24 hours) at 01/13/2018 1323 Last data filed at 01/13/2018 1300 Gross per 24 hour  Intake 220 ml  Output 2100 ml  Net -1880 ml     Physical Exam: Vital Signs Blood pressure 120/64, pulse 94, temperature 98.6 F (37 C), resp. rate 19, height 5\' 7"  (1.702 m), weight 91.2 kg, SpO2 92 %. Constitutional: No distress . Vital signs reviewed. HEENT: EOMI, oral membranes moist Neck: supple Cardiovascular: RRR without murmur. No JVD    Respiratory: CTA Bilaterally without wheezes or rales. Normal effort    GI: BS +, non-tender, non-distended    Neurological: Alert but a little delayed today.  Sensation diminished to light touch below T4, stable.  Motor: RUE: Shoulder abduction, elbow flex 3+/5, elbow extension, wrist extension 1+ to 2-/5, hand gip 0/5--trace  Finger flexion in all 4 digits on left and index finger on R--no obvious changes. LUE: Shoulder abduction, elbow flexion 3-/5, wrist extension 2/5, hand gip 0/5- no motor changes.  R LE: 1+/5 proximal to distal, LLE 2/5 prox to 2/5 distally- stable exam.   Skin:scattered bruises Psychiatric: pleasant but flat  Assessment/Plan: 1. 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  Physiatrist is providing close team  supervision and 24 hour management of active medical problems listed below.  Physiatrist and rehab team continue to assess barriers to discharge/monitor patient progress toward functional and medical goals  Care Tool:  Bathing  Bathing activity did not occur: Refused Body parts bathed by patient: Right arm, Left arm, Chest, Face   Body parts bathed by helper: Abdomen, Front perineal area, Buttocks, Right upper leg, Left upper leg, Right lower leg, Left lower leg Body parts n/a: Face, Front perineal area, Buttocks(previously washed )   Bathing assist Assist Level: Moderate Assistance - Patient 50 - 74%     Upper Body Dressing/Undressing Upper body dressing   What is the patient wearing?: Pull over shirt    Upper body assist Assist Level: Maximal Assistance - Patient 25 - 49%    Lower Body Dressing/Undressing Lower body dressing      What is the patient wearing?: Pants     Lower body assist Assist for lower body dressing: Total Assistance - Patient < 25%     Toileting Toileting    Toileting assist Assist for toileting: Dependent - Patient 0%     Transfers Chair/bed transfer  Transfers assist     Chair/bed transfer assist level: 2 Helpers     Locomotion Ambulation   Ambulation assist   Ambulation activity did not occur: Safety/medical concerns          Walk 10 feet activity   Assist  Walk 10 feet activity did not occur: Safety/medical concerns        Walk 50 feet activity   Assist Walk 50 feet with 2 turns  activity did not occur: Safety/medical concerns         Walk 150 feet activity   Assist Walk 150 feet activity did not occur: Safety/medical concerns         Walk 10 feet on uneven surface  activity   Assist Walk 10 feet on uneven surfaces activity did not occur: Safety/medical concerns         Wheelchair     Assist Will patient use wheelchair at discharge?: Yes Type of Wheelchair: Power Wheelchair activity did not  occur: Safety/medical concerns  Wheelchair assist level: Supervision/Verbal cueing Max wheelchair distance: 25    Wheelchair 50 feet with 2 turns activity    Assist    Wheelchair 50 feet with 2 turns activity did not occur: Safety/medical concerns   Assist Level: Minimal Assistance - Patient > 75%   Wheelchair 150 feet activity     Assist Wheelchair 150 feet activity did not occur: Safety/medical concerns   Assist Level: Minimal Assistance - Patient > 75%     Medical Problem List and Plan: 1.  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  Status post C5-6 and C6-7 anterior cervical discectomy and fusion 11/29/2017.  TLSO back brace when out of bed     -Continue CIR therapies including PT, OT   -SNF pending on Tuesday  -spent time talking with dauhgter re: expectations, course 2.  DVT Prophylaxis/Anticoagulation:  Acute mobile DVT Right common/proximal femoral vein  -IVCF placed by radiology   -edema mgt 3. Pain Management:   Neurontin : change to 400mg  tid with meals and 600mg  at bedtime   initiated cymbalta   oxycodone as needed for pain   had a better night 4. Mood:   -ongoing ego support, positive reinforcement  -neuropsych continues to follow  - trial of Cymbalta which may help with her pain toffo.  5. Neuropsych: This patient is capable of making decisions on her own behalf. 6. Skin/Wound Care: Routine skin checks  -edema: continue to elevate limbs when possible  -nutritionally much improved 7. Fluids/Electrolytes/Nutrition:    -replacing potassium  - protein for hypoalbuminemia  -labs all stable, eating very well   8.  Neurogenic bowel and bladder.  Flomax 0.4 mg daily.    -I/O caths    -AM bowel program       -  senna-s to just senna at bedtime   -glycerin supp daily at 0600   - 0600 program so as to avoid conflict with therapy 9.  Hypertension.  Norvasc 10 mg daily.   Vitals:   01/12/18 2030 01/13/18  0434  BP: (!) 115/57 120/64  Pulse: 92 94  Resp: 17 19  Temp: (!) 97.5 F (36.4 C) 98.6 F (37 C)  SpO2: 94% 92%   Controlled on 12/13 10. Acute blood loss anemia:     -hgb 10.9  11/11--->10/6 11/29--> 11 12/4  -no signs of blood loss       LOS: 37 days A FACE TO FACE EVALUATION WAS PERFORMED  Meredith Staggers 01/13/2018, 1:23 PM

## 2018-01-13 NOTE — Progress Notes (Signed)
Occupational Therapy Session Note  Patient Details  Name: Sherry [name] MRN: [id number] Date of Birth: 05-20-1934  Today's Date: 01/13/2018 OT Individual Time: 1055-1200 OT Individual Time Calculation (min): 65 min    Short Term Goals: Week 3:  OT Short Term Goal 1 (Week 3): Pt will perform static sitting balance on EOB for 5 minutes with max A or less. OT Short Term Goal 1 - Progress (Week 3): Progressing toward goal OT Short Term Goal 2 (Week 3): Pt will perform UB dressing with mod A. OT Short Term Goal 2 - Progress (Week 3): Progressing toward goal OT Short Term Goal 4 (Week 3): STGs=LTGS due to ELOS  Skilled Therapeutic Interventions/Progress Updates:    Patient in bed upon arrival, Nursing completed cathing during session.  LB dressing completed in supine position dependent.   Rolling side to side max A, supine to SSP dependent.  Completed SSP at edge of bed posture and balance/reaching activity, increased side to side volitional motion noted, Reviewed pulmonary strategies and head/shoulder positioning.  SB transfer bed to power chair with max A of 2.  Completed bilateral hand ROM / light stretching. Patient remained in w/c at close of session with call bell and tray table accessible  Therapy Documentation Precautions:  Precautions Precautions: Cervical, Back, Fall Precaution Comments: back precautions due to thoracic fx s/p ACDF; needs TLSO for all OOB activity but per order set does not need cervical collar  Required Braces or Orthoses: Spinal Brace Spinal Brace: Thoracolumbosacral orthotic, Applied in sitting position Restrictions Weight Bearing Restrictions: No General:   Vital Signs:   Pain:   ADL:      Therapy/Group: Individual Therapy  Carlos Levering 01/13/2018, 12:23 PM

## 2018-01-13 NOTE — Progress Notes (Signed)
Occupational Therapy Session Note  Patient Details  Name: Sherry [name] MRN: [id number] Date of Birth: 1934-08-27  Today's Date: 01/13/2018 OT Individual Time: 7673-4193 OT Individual Time Calculation (min): 75 min   Skilled Therapeutic Interventions/Progress Updates:    Pt greeted in bed, lethargic and reporting 10/10 pain in UEs. Declining OOB activity. RN in to administer medication at start of session. Tx focus on enhancing affect, pain mgt, and UE NMR. Used therapy ipad to play meaningful music while pt completed gentle straight arm raises. OT completed passive stretching and joint compressions for elbow, wrist, and digits. Pt reported UEs felt "much better" after. Continued with UE AROM/NMR via rearranging vase of flowers. OT trimmed flower stems and changed out water. She required Min A for placement of stems when vase was nearly full. Pt actively smelling flowers, and requesting to place flowers on nearby table. At end of session pt was left with NT.  Educated Therapist, sports and NT about keeping music playing throughout the day for improving pts affect.   Therapy Documentation Precautions:  Precautions Precautions: Cervical, Back, Fall Precaution Comments: back precautions due to thoracic fx s/p ACDF; needs TLSO for all OOB activity but per order set does not need cervical collar  Required Braces or Orthoses: Spinal Brace Spinal Brace: Thoracolumbosacral orthotic, Applied in sitting position Restrictions Weight Bearing Restrictions: No Pain: Stated and addressed as written above    ADL: ADL Equipment Provided: Other (comment)(dorsal wrist cuff) Eating: Supervision/safety Where Assessed-Eating: Wheelchair Grooming: Not assessed Where Assessed-Grooming: Bed level Upper Body Bathing: Moderate assistance Where Assessed-Upper Body Bathing: Bed level Lower Body Bathing: Other (comment), Maximal assistance(2 helpers) Where Assessed-Lower Body Bathing: Bed level Upper Body Dressing:  Maximal assistance, Other (Comment)(2 helpers) Where Assessed-Upper Body Dressing: Bed level Lower Body Dressing: Maximal assistance, Other (Comment)(2 helpers) Where Assessed-Lower Body Dressing: Bed level Toileting: Not assessed Toilet Transfer: Not assessed Walk-In Shower Transfer: Not assessed      Therapy/Group: Individual Therapy  Quavon Keisling A Pinkney Venard 01/13/2018, 12:34 PM

## 2018-01-13 NOTE — Progress Notes (Signed)
Physical Therapy Session Note  Patient Details  Name: Sherry [name] MRN: [id number] Date of Birth: 1934/08/15  Today's Date: 01/13/2018 PT Individual Time: 1330-1430 PT Individual Time Calculation (min): 60 min   Short Term Goals: Week 5:  PT Short Term Goal 1 (Week 5): =LTG due to anticipated d/c to SNF  Skilled Therapeutic Interventions/Progress Updates:    Pt received reclined in TIS w/c in room on the phone with an old work friend. Assisted pt with holding the phone while she finished conversation. No complaints of pain. Pt is leaning heavily to the L and hips are slide forward in chair but shows poor awareness of body position. Power w/c mobility from room to therapy gym x 150 ft with Supervision to occasional min A for steering and v/c for obstacles. Pt needs increased cueing for safety and to attend to obstacles this date. Pt reports feeling distracted that she is less aware of her environment. Sliding board transfer w/c to mat table with max A x 2. Sitting balance EOM with max A progressing to CGA with focus on anterior weight shift and maintaining midline (pt tends to lean posteriorly and to the R). Sliding board transfer mat table to w/c max A x 2. Pt left reclined in TIS power w/c with soft touch call button in reach, daughter Sherry [name] present.  Therapy Documentation Precautions:  Precautions Precautions: Cervical, Back, Fall Precaution Comments: back precautions due to thoracic fx s/p ACDF; needs TLSO for all OOB activity but per order set does not need cervical collar  Required Braces or Orthoses: Spinal Brace Spinal Brace: Thoracolumbosacral orthotic, Applied in sitting position Restrictions Weight Bearing Restrictions: No   Therapy/Group: Individual Therapy  Excell Seltzer, PT, DPT  01/13/2018, 3:40 PM

## 2018-01-14 ENCOUNTER — Inpatient Hospital Stay (HOSPITAL_COMMUNITY): Payer: Medicare HMO | Admitting: Occupational Therapy

## 2018-01-14 DIAGNOSIS — A499 Bacterial infection, unspecified: Secondary | ICD-10-CM

## 2018-01-14 LAB — URINALYSIS, ROUTINE W REFLEX MICROSCOPIC
Bilirubin Urine: NEGATIVE
Glucose, UA: NEGATIVE mg/dL
Ketones, ur: NEGATIVE mg/dL
Nitrite: NEGATIVE
Protein, ur: NEGATIVE mg/dL
SPECIFIC GRAVITY, URINE: 1.017 (ref 1.005–1.030)
pH: 5 (ref 5.0–8.0)

## 2018-01-14 MED ORDER — GABAPENTIN 400 MG PO CAPS
400.0000 mg | ORAL_CAPSULE | Freq: Three times a day (TID) | ORAL | Status: DC
  Administered 2018-01-14 – 2018-01-19 (×16): 400 mg via ORAL
  Filled 2018-01-14 (×16): qty 1

## 2018-01-14 MED ORDER — SODIUM CHLORIDE 0.9 % IV SOLN
1.0000 g | INTRAVENOUS | Status: DC
  Administered 2018-01-14 – 2018-01-17 (×4): 1 g via INTRAVENOUS
  Filled 2018-01-14 (×6): qty 10

## 2018-01-14 NOTE — Progress Notes (Signed)
Daughter states that she is concerned about pt's current condition. According to daughter, pt is confused, disoriented, and not eating well.  Daughter states she wants pt to return to medications regimen pt was on 3 weeks ago. Dr. Naaman Plummer made aware of daughter's concerns. Continue plan of care.   Sherry [name] W Kyisha Fowle

## 2018-01-14 NOTE — Progress Notes (Signed)
Occupational Therapy Session Note  Patient Details  Name: Sherry [name] MRN: [id number] Date of Birth: April 25, 1934  Today's Date: 01/14/2018 OT Individual Time: 1410-1442 OT Individual Time Calculation (min): 32 min   Short Term Goals: Week 4:  OT Short Term Goal 1 (Week 4): STGs=LTGs due to ELOS  Skilled Therapeutic Interventions/Progress Updates:    Pt greeted in bed with family present. Reported having no pain and agreeable to session. 2 helpers required for rolling Rt>Lt for hygiene and brief change post bowel incontinence. 2 assist for donning thigh high Teds and pants with pt rolling towards Rt>Lt with Total A. Pt boosted up in bed and repositioned for comfort. She was left with all needs within reach at session exit.   Therapy Documentation Precautions:  Precautions Precautions: Cervical, Back, Fall Precaution Comments: back precautions due to thoracic fx s/p ACDF; needs TLSO for all OOB activity but per order set does not need cervical collar  Required Braces or Orthoses: Spinal Brace Spinal Brace: Thoracolumbosacral orthotic, Applied in sitting position Restrictions Weight Bearing Restrictions: No Vital Signs: Therapy Vitals Temp: 98.3 F (36.8 C) Pulse Rate: 96 Resp: 18 BP: 124/71 Oxygen Therapy SpO2: 91 % Pain: Pain Assessment Pain Score: 6  ADL: ADL Equipment Provided: Other (comment)(dorsal wrist cuff) Eating: Supervision/safety Where Assessed-Eating: Wheelchair Grooming: Not assessed Where Assessed-Grooming: Bed level Upper Body Bathing: Moderate assistance Where Assessed-Upper Body Bathing: Bed level Lower Body Bathing: Other (comment), Maximal assistance(2 helpers) Where Assessed-Lower Body Bathing: Bed level Upper Body Dressing: Maximal assistance, Other (Comment)(2 helpers) Where Assessed-Upper Body Dressing: Bed level Lower Body Dressing: Maximal assistance, Other (Comment)(2 helpers) Where Assessed-Lower Body Dressing: Bed level Toileting: Not  assessed Toilet Transfer: Not assessed Walk-In Shower Transfer: Not assessed     Therapy/Group: Individual Therapy  Eulala Newcombe A Briahnna Harries 01/14/2018, 4:50 PM

## 2018-01-14 NOTE — Progress Notes (Signed)
[facility] PHYSICAL MEDICINE & REHABILITATION PROGRESS NOTE   Subjective/Complaints: Low grade temp this morning. Has been complaining of more lower abdominal discomfort. Daughter concerned that she is not herself   ROS: Patient denies   rash, sore throat, blurred vision, nausea, vomiting, diarrhea, cough, shortness of breath or chest pain,   back pain, headache, or mood change.     Objective:   No results found. No results for input(s): WBC, HGB, HCT, PLT in the last 72 hours. No results for input(s): NA, K, CL, CO2, GLUCOSE, BUN, CREATININE, CALCIUM in the last 72 hours.  Intake/Output Summary (Last 24 hours) at 01/14/2018 1135 Last data filed at 01/14/2018 0158 Gross per 24 hour  Intake 340 ml  Output 2200 ml  Net -1860 ml     Physical Exam: Vital Signs Blood pressure 116/63, pulse 98, temperature 100.2 F (37.9 C), temperature source Axillary, resp. rate 16, height 5\' 7"  (1.702 m), weight 91.2 kg, SpO2 92 %. Constitutional: No distress . Vital signs reviewed. HEENT: EOMI, oral membranes moist Neck: supple Cardiovascular: RRR without murmur. No JVD    Respiratory: CTA Bilaterally without wheezes or rales. Normal effort    GI: BS +, non-tender, non-distended   Neurological: arousal at baseline when I saw her today. Cognitively displays reasonable insight and awareness.  Sensation diminished to light touch below T4, stable.  Motor: RUE: Shoulder abduction, elbow flex 3+/5, elbow extension, wrist extension 1+ to 2-/5, hand gip 0/5--trace  Finger flexion in all 4 digits on left and index finger on R--no obvious changes. LUE: Shoulder abduction, elbow flexion 3-/5, wrist extension 2/5, hand gip 0/5-motor exam stable.  R LE: 1+/5 proximal to distal, LLE 2/5 prox to 2/5 distally- stable exam.   Skin:scattered bruises Psychiatric: cooperative  Assessment/Plan: 1. Functional deficits secondary to C6 SCI, motor/sensory incomplete which require 3+ hours per day of  interdisciplinary therapy in a comprehensive inpatient rehab setting.  Physiatrist is providing close team supervision and 24 hour management of active medical problems listed below.  Physiatrist and rehab team continue to assess barriers to discharge/monitor patient progress toward functional and medical goals  Care Tool:  Bathing  Bathing activity did not occur: Refused Body parts bathed by patient: Right arm, Left arm, Chest, Face   Body parts bathed by helper: Abdomen, Front perineal area, Buttocks, Right upper leg, Left upper leg, Right lower leg, Left lower leg Body parts n/a: Face, Front perineal area, Buttocks(previously washed )   Bathing assist Assist Level: Moderate Assistance - Patient 50 - 74%     Upper Body Dressing/Undressing Upper body dressing   What is the patient wearing?: Pull over shirt    Upper body assist Assist Level: Maximal Assistance - Patient 25 - 49%    Lower Body Dressing/Undressing Lower body dressing      What is the patient wearing?: Pants     Lower body assist Assist for lower body dressing: Total Assistance - Patient < 25%     Toileting Toileting    Toileting assist Assist for toileting: Dependent - Patient 0%     Transfers Chair/bed transfer  Transfers assist     Chair/bed transfer assist level: 2 Helpers     Locomotion Ambulation   Ambulation assist   Ambulation activity did not occur: Safety/medical concerns          Walk 10 feet activity   Assist  Walk 10 feet activity did not occur: Safety/medical concerns        Walk 50 feet  activity   Assist Walk 50 feet with 2 turns activity did not occur: Safety/medical concerns         Walk 150 feet activity   Assist Walk 150 feet activity did not occur: Safety/medical concerns         Walk 10 feet on uneven surface  activity   Assist Walk 10 feet on uneven surfaces activity did not occur: Safety/medical concerns          Wheelchair     Assist Will patient use wheelchair at discharge?: Yes Type of Wheelchair: Power Wheelchair activity did not occur: Safety/medical concerns  Wheelchair assist level: Minimal Assistance - Patient > 75% Max wheelchair distance: 150'    Wheelchair 50 feet with 2 turns activity    Assist    Wheelchair 50 feet with 2 turns activity did not occur: Safety/medical concerns   Assist Level: Minimal Assistance - Patient > 75%   Wheelchair 150 feet activity     Assist Wheelchair 150 feet activity did not occur: Safety/medical concerns   Assist Level: Minimal Assistance - Patient > 75%     Medical Problem List and Plan: 1.  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  Status post C5-6 and C6-7 anterior cervical discectomy and fusion 11/29/2017.  TLSO back brace when out of bed     -Continue CIR therapies including PT, OT   -SNF pending on Tuesday 2.  DVT Prophylaxis/Anticoagulation:  Acute mobile DVT Right common/proximal femoral vein  -IVCF placed by radiology   -edema mgt 3. Pain Management:   Neurontin : change back to 400mg  TID   initiated cymbalta   oxycodone as needed for pain 4. Mood:   -ongoing ego support, positive reinforcement  -neuropsych continues to follow  - trial of Cymbalta   -suspect her decreased arousal is multifactorial due to meds, UTI, mood   -have reduced gabapentin, stopped hs xanax, rx uti 5. Neuropsych: This patient is capable of making decisions on her own behalf. 6. Skin/Wound Care: Routine skin checks  -edema: continue to elevate limbs when possible  -nutritionally much improved 7. Fluids/Electrolytes/Nutrition:    -replacing potassium  - protein for hypoalbuminemia  -labs all stable, eating very well   8.  Neurogenic bowel and bladder.  Flomax 0.4 mg daily.    -I/O caths    -AM bowel program       -  senna-s to just senna at bedtime   -glycerin supp daily at 0600   -  0600 program so as to avoid conflict with therapy 9.  Hypertension.  Norvasc 10 mg daily.   Vitals:   01/14/18 0451 01/14/18 0556  BP:    Pulse: 98   Resp:    Temp:  100.2 F (37.9 C)  SpO2: 92%    Controlled on 12/13 10. Acute blood loss anemia:     -hgb 10.9  11/11--->10/6 11/29--> 11 12/4  -no signs of blood loss 11. Low grade temp: UA +  -ceftriaxone 1g q24 until culture in        LOS: 38 days [place] 01/14/2018, 11:35 AM

## 2018-01-14 NOTE — Plan of Care (Signed)
  Problem: Consults Goal: RH SPINAL CORD INJURY PATIENT EDUCATION Description  See Patient Education module for education specifics.  Outcome: Progressing   Problem: SCI BOWEL ELIMINATION Goal: RH STG MANAGE BOWEL WITH ASSISTANCE Description STG Manage Bowel with total Assistance.  Outcome: Progressing Goal: RH STG SCI MANAGE BOWEL PROGRAM W/ASSIST OR AS APPROPRIATE Description STG SCI Manage bowel program w/total assist or as appropriate.  Outcome: Progressing   Problem: SCI BLADDER ELIMINATION Goal: RH STG MANAGE BLADDER WITH MEDICATION WITH ASSISTANCE Description STG Manage Bladder With Medication With  Total Assistance.  Outcome: Progressing   Problem: RH SKIN INTEGRITY Goal: RH STG SKIN FREE OF INFECTION/BREAKDOWN Description With total assist  Outcome: Progressing Goal: RH STG MAINTAIN SKIN INTEGRITY WITH ASSISTANCE Description STG Maintain Skin Integrity With total Assistance.  Outcome: Progressing   Problem: RH SAFETY Goal: RH STG ADHERE TO SAFETY PRECAUTIONS W/ASSISTANCE/DEVICE Description STG Adhere to Safety Precautions With max Assistance/Device.  Outcome: Progressing   Problem: RH PAIN MANAGEMENT Goal: RH STG PAIN MANAGED AT OR BELOW PT'S PAIN GOAL Description At or below level 6  Outcome: Progressing   Problem: RH KNOWLEDGE DEFICIT SCI Goal: RH STG INCREASE KNOWLEDGE OF SELF CARE AFTER SCI Description Pt and family will be able to direct care post discharge using handout/resources independently  Outcome: Progressing

## 2018-01-15 ENCOUNTER — Inpatient Hospital Stay (HOSPITAL_COMMUNITY): Payer: Medicare HMO

## 2018-01-15 ENCOUNTER — Inpatient Hospital Stay (HOSPITAL_COMMUNITY): Payer: Medicare HMO | Admitting: Occupational Therapy

## 2018-01-15 ENCOUNTER — Inpatient Hospital Stay (HOSPITAL_COMMUNITY): Payer: Medicare HMO | Admitting: Physical Therapy

## 2018-01-15 DIAGNOSIS — I825Y3 Chronic embolism and thrombosis of unspecified deep veins of proximal lower extremity, bilateral: Secondary | ICD-10-CM

## 2018-01-15 LAB — BASIC METABOLIC PANEL
Anion gap: 12 (ref 5–15)
BUN: 19 mg/dL (ref 8–23)
CALCIUM: 8.1 mg/dL — AB (ref 8.9–10.3)
CO2: 23 mmol/L (ref 22–32)
Chloride: 95 mmol/L — ABNORMAL LOW (ref 98–111)
Creatinine, Ser: 0.39 mg/dL — ABNORMAL LOW (ref 0.44–1.00)
GFR calc non Af Amer: >60 mL/min (ref >60)
Glucose, Bld: 106 mg/dL — ABNORMAL HIGH (ref 70–99)
Potassium: 4.6 mmol/L (ref 3.5–5.1)
SODIUM: 130 mmol/L — AB (ref 135–145)

## 2018-01-15 LAB — CBC
HCT: 30.3 % — ABNORMAL LOW (ref 36.0–46.0)
Hemoglobin: 9.7 g/dL — ABNORMAL LOW (ref 12.0–15.0)
MCH: 26.9 pg (ref 26.0–34.0)
MCHC: 32 g/dL (ref 30.0–36.0)
MCV: 83.9 fL (ref 80.0–100.0)
NRBC: 0 % (ref 0.0–0.2)
PLATELETS: 622 10*3/uL — AB (ref 150–400)
RBC: 3.61 MIL/uL — ABNORMAL LOW (ref 3.87–5.11)
RDW: 15.7 % — AB (ref 11.5–15.5)
WBC: 15.9 10*3/uL — ABNORMAL HIGH (ref 4.0–10.5)

## 2018-01-15 MED ORDER — FLEET ENEMA 7-19 GM/118ML RE ENEM
1.0000 | ENEMA | Freq: Once | RECTAL | Status: AC
  Administered 2018-01-16: 1 via RECTAL
  Filled 2018-01-15: qty 1

## 2018-01-15 MED ORDER — GLYCERIN (LAXATIVE) 2.1 G RE SUPP
1.0000 | Freq: Every day | RECTAL | Status: DC
  Administered 2018-01-17 – 2018-01-19 (×3): 1 via RECTAL
  Filled 2018-01-15 (×3): qty 1

## 2018-01-15 NOTE — Progress Notes (Signed)
[facility] PHYSICAL MEDICINE & REHABILITATION PROGRESS NOTE   Subjective/Complaints: Low grade temp this morning. Has been complaining of more lower abdominal discomfort. Daughter concerned that she is not herself   ROS: Patient denies   rash, sore throat, blurred vision, nausea, vomiting, diarrhea, cough, shortness of breath or chest pain,   back pain, headache, or mood change.     Objective:   No results found. Recent Labs    01/15/18 0536  WBC 15.9*  HGB 9.7*  HCT 30.3*  PLT 622*   Recent Labs    01/15/18 0536  NA 130*  K 4.6  CL 95*  CO2 23  GLUCOSE 106*  BUN 19  CREATININE 0.39*  CALCIUM 8.1*    Intake/Output Summary (Last 24 hours) at 01/15/2018 1356 Last data filed at 01/15/2018 1050 Gross per 24 hour  Intake -  Output 3050 ml  Net -3050 ml     Physical Exam: Vital Signs Blood pressure 113/66, pulse 94, temperature 98.6 F (37 C), resp. rate 14, height 5\' 7"  (1.702 m), weight 91.9 kg, SpO2 93 %. Constitutional: No distress . Vital signs reviewed. HEENT: EOMI, oral membranes moist Neck: supple Cardiovascular: RRR without murmur. No JVD    Respiratory: CTA Bilaterally without wheezes or rales. Normal effort    GI: BS +, non-tender, non-distended  Ext: 2+ edema bilaterally with blisters in thighs   Neurological: arousal at baseline when I saw her today. Cognitively displays reasonable insight and awareness.  Sensation diminished to light touch below T4, stable.  Motor: RUE: Shoulder abduction, elbow flex 3+/5, elbow extension, wrist extension 1+ to 2-/5, hand gip 0/5--trace  Finger flexion in all 4 digits on left and index finger on R--no obvious changes. LUE: Shoulder abduction, elbow flexion 3-/5, wrist extension 2/5, hand gip 0/5-motor exam stable.  R LE: 1+/5 proximal to distal, LLE 2/5 prox to 2/5 distally- stable exam.   Skin:scattered bruises Psychiatric: cooperative  Assessment/Plan: 1. Functional deficits secondary to C6 SCI, motor/sensory  incomplete which require 3+ hours per day of interdisciplinary therapy in a comprehensive inpatient rehab setting.  Physiatrist is providing close team supervision and 24 hour management of active medical problems listed below.  Physiatrist and rehab team continue to assess barriers to discharge/monitor patient progress toward functional and medical goals  Care Tool:  Bathing  Bathing activity did not occur: Refused Body parts bathed by patient: Right arm, Left arm, Chest, Face   Body parts bathed by helper: Abdomen, Front perineal area, Buttocks, Right upper leg, Left upper leg, Right lower leg, Left lower leg Body parts n/a: Face, Front perineal area, Buttocks(previously washed )   Bathing assist Assist Level: Moderate Assistance - Patient 50 - 74%     Upper Body Dressing/Undressing Upper body dressing   What is the patient wearing?: Pull over shirt    Upper body assist Assist Level: Maximal Assistance - Patient 25 - 49%    Lower Body Dressing/Undressing Lower body dressing      What is the patient wearing?: Pants     Lower body assist Assist for lower body dressing: Total Assistance - Patient < 25%     Toileting Toileting    Toileting assist Assist for toileting: 2 Helpers     Transfers Chair/bed transfer  Transfers assist     Chair/bed transfer assist level: 2 Helpers     Locomotion Ambulation   Ambulation assist   Ambulation activity did not occur: Safety/medical concerns  Walk 10 feet activity   Assist  Walk 10 feet activity did not occur: Safety/medical concerns        Walk 50 feet activity   Assist Walk 50 feet with 2 turns activity did not occur: Safety/medical concerns         Walk 150 feet activity   Assist Walk 150 feet activity did not occur: Safety/medical concerns         Walk 10 feet on uneven surface  activity   Assist Walk 10 feet on uneven surfaces activity did not occur: Safety/medical  concerns         Wheelchair     Assist Will patient use wheelchair at discharge?: Yes Type of Wheelchair: Power Wheelchair activity did not occur: Safety/medical concerns  Wheelchair assist level: Minimal Assistance - Patient > 75% Max wheelchair distance: 150'    Wheelchair 50 feet with 2 turns activity    Assist    Wheelchair 50 feet with 2 turns activity did not occur: Safety/medical concerns   Assist Level: Minimal Assistance - Patient > 75%   Wheelchair 150 feet activity     Assist Wheelchair 150 feet activity did not occur: Safety/medical concerns   Assist Level: Minimal Assistance - Patient > 75%     Medical Problem List and Plan: 1.  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  Status post C5-6 and C6-7 anterior cervical discectomy and fusion 11/29/2017.  TLSO back brace when out of bed     -Continue CIR therapies including PT, OT   -SNF pending on Tuesday, may have to hold pending medical 2.  DVT Prophylaxis/Anticoagulation:  Acute mobile DVT Right common/proximal femoral vein  -IVCF placed by radiology   -edema mgt has become an increasing problem.   -will re-doppler LE's as I suspect that left leg is involved too now given her clinical appearance (first exam was limited LLE)  -consider xarelto rx (pork allergy) 3. Pain Management:   Neurontin : change back to 400mg  TID   initiated cymbalta   oxycodone as needed for pain 4. Mood:   -ongoing ego support, positive reinforcement  -neuropsych continues to follow  - trial of Cymbalta   -suspect her decreased arousal is multifactorial due to meds, UTI, mood   -have reduced gabapentin, stopped hs xanax, rx uti   -more alert 5. Neuropsych: This patient is capable of making decisions on her own behalf. 6. Skin/Wound Care: Routine skin checks  -edema: continue to elevate limbs when possible  -nutritionally much improved 7. Fluids/Electrolytes/Nutrition:     -replacing potassium  - protein for hypoalbuminemia  -labs all stable, eating very well   8.  Neurogenic bowel and bladder.  Flomax 0.4 mg daily.    -I/O caths    -AM bowel program       -   senna at bedtime   -glycerin supp daily at 0600   - 0600 program so as to avoid conflict with therapy 9.  Hypertension.  Norvasc 10 mg daily.   Vitals:   01/14/18 1952 01/15/18 0432  BP: 123/72 113/66  Pulse: 89 94  Resp: 16 14  Temp: 98.1 F (36.7 C) 98.6 F (37 C)  SpO2: 94% 93%   Controlled on 12/15 10. Acute blood loss anemia:     -hgb 10.9  11/11--->10/6 11/29--> 11 12/4  -no signs of blood loss 11. Klebsiella UTI:   -ceftriaxone 1g q24 12/14, sens pending        LOS: 39 days A  FACE TO FACE EVALUATION WAS PERFORMED  Meredith Staggers 01/15/2018, 1:56 PM

## 2018-01-15 NOTE — Plan of Care (Signed)
  Problem: Consults Goal: RH SPINAL CORD INJURY PATIENT EDUCATION Description  See Patient Education module for education specifics.  Outcome: Progressing   Problem: SCI BOWEL ELIMINATION Goal: RH STG MANAGE BOWEL WITH ASSISTANCE Description STG Manage Bowel with total Assistance.  Outcome: Progressing Goal: RH STG SCI MANAGE BOWEL PROGRAM W/ASSIST OR AS APPROPRIATE Description STG SCI Manage bowel program w/total assist or as appropriate.  Outcome: Progressing   Problem: RH SKIN INTEGRITY Goal: RH STG SKIN FREE OF INFECTION/BREAKDOWN Description With total assist  Outcome: Progressing Goal: RH STG MAINTAIN SKIN INTEGRITY WITH ASSISTANCE Description STG Maintain Skin Integrity With total Assistance.  Outcome: Progressing   Problem: RH SAFETY Goal: RH STG ADHERE TO SAFETY PRECAUTIONS W/ASSISTANCE/DEVICE Description STG Adhere to Safety Precautions With max Assistance/Device.  Outcome: Progressing   Problem: SCI BLADDER ELIMINATION Goal: RH STG MANAGE BLADDER WITH MEDICATION WITH ASSISTANCE Description STG Manage Bladder With Medication With  Total Assistance.  Outcome: Not Progressing   Problem: RH PAIN MANAGEMENT Goal: RH STG PAIN MANAGED AT OR BELOW PT'S PAIN GOAL Description At or below level 6  Outcome: Not Progressing   Problem: RH KNOWLEDGE DEFICIT SCI Goal: RH STG INCREASE KNOWLEDGE OF SELF CARE AFTER SCI Description Pt and family will be able to direct care post discharge using handout/resources independently  Outcome: Not Progressing

## 2018-01-15 NOTE — Progress Notes (Signed)
Dr. Naaman Plummer made aware of KUB results. New order obtained. Continue plan of care.  Delio Slates W Donyae Kilner

## 2018-01-15 NOTE — Progress Notes (Signed)
Occupational Therapy Session Note  Patient Details  Name: Sherry [name] MRN: [id number] Date of Birth: 01/04/1935  Today's Date: 01/15/2018 OT Individual Time: 1155-2080 OT Individual Time Calculation (min): 43 min   Short Term Goals: Week 5:  OT Short Term Goal 1 (Week 5): STGs=LTGs secondary to upcoming discharge  Skilled Therapeutic Interventions/Progress Updates:    Pt greeted in [place], required max encouragement to remain in w/c vs returning to bed. She refused to propel to dayroom due to hands hurting. OT propelled pt out of room, and pt then took over in hallway, and parked [place] near Christmas tree. While playing favorite Elvis music, encouraged pt to engage in UE exercises, however she refused, and began to cry. Pt reporting she is "hurting" and "sad." OT and family provided emotional support and encouragement. For pain mgt, OT completed gentle massage, joint compressions, and stretching B UEs. Pt with limited digit flexion bilaterally and Rt wrist flexion/extension. Educated family to perform ROM exercises for pain relief and contracture prevention. They verbalized understanding and were present throughout to observe. Pt reporting that hands felt "much better" at end of tx, smiling a little, and laughing occasionally with family. Pt remained in dayroom with family present to increase affect. Family reported they would notify RN staff when they wanted to return to room. RN made aware of pts position.    Therapy Documentation Precautions:  Precautions Precautions: Cervical, Back, Fall Precaution Comments: back precautions due to thoracic fx s/p ACDF; needs TLSO for all OOB activity but per order set does not need cervical collar  Required Braces or Orthoses: Spinal Brace Spinal Brace: Thoracolumbosacral orthotic, Applied in sitting position Restrictions Weight Bearing Restrictions: No Pain: Pain Assessment Pain Scale: 0-10 Pain Score: 8  Pain Type: Neuropathic pain Pain Location:  Hand Pain Orientation: Right;Left Pain Radiating Towards: right and left arm Pain Descriptors / Indicators: Burning;Aching Pain Frequency: Constant Pain Onset: On-going Patients Stated Pain Goal: 4 Pain Intervention(s): Medication (See eMAR)(tylenol, gabapentin) ADL: ADL Equipment Provided: Other (comment)(dorsal wrist cuff) Eating: Supervision/safety Where Assessed-Eating: Wheelchair Grooming: Not assessed Where Assessed-Grooming: Bed level Upper Body Bathing: Moderate assistance Where Assessed-Upper Body Bathing: Bed level Lower Body Bathing: Other (comment), Maximal assistance(2 helpers) Where Assessed-Lower Body Bathing: Bed level Upper Body Dressing: Maximal assistance, Other (Comment)(2 helpers) Where Assessed-Upper Body Dressing: Bed level Lower Body Dressing: Maximal assistance, Other (Comment)(2 helpers) Where Assessed-Lower Body Dressing: Bed level Toileting: Not assessed Toilet Transfer: Not assessed Walk-In Shower Transfer: Not assessed      Therapy/Group: Individual Therapy  Mahki Spikes A Ailey Wessling 01/15/2018, 4:27 PM

## 2018-01-15 NOTE — Progress Notes (Signed)
Pt is complaining of tightness of abdomen and chest area. Family is anxious about pt's current condition. Dr Naaman Plummer made aware. Continue plan of care.   Sherry [name] Sherry [name]

## 2018-01-16 ENCOUNTER — Inpatient Hospital Stay (HOSPITAL_COMMUNITY): Payer: Medicare HMO | Admitting: Occupational Therapy

## 2018-01-16 ENCOUNTER — Inpatient Hospital Stay (HOSPITAL_COMMUNITY): Payer: Medicare HMO

## 2018-01-16 ENCOUNTER — Inpatient Hospital Stay (HOSPITAL_COMMUNITY): Payer: Medicare HMO | Admitting: Physical Therapy

## 2018-01-16 DIAGNOSIS — M7989 Other specified soft tissue disorders: Secondary | ICD-10-CM

## 2018-01-16 LAB — URINE CULTURE: Culture: >=100000 — AB

## 2018-01-16 MED ORDER — RIVAROXABAN 20 MG PO TABS
20.0000 mg | ORAL_TABLET | Freq: Every day | ORAL | Status: DC
  Administered 2018-01-16 – 2018-01-18 (×3): 20 mg via ORAL
  Filled 2018-01-16 (×3): qty 1

## 2018-01-16 NOTE — Discharge Summary (Addendum)
NAME: Sherry [name], Sherry [name] MEDICAL RECORD ER:[id number] ACCOUNT [id number] DATE OF BIRTH:1934/10/18 FACILITY: [facility] LOCATION: [facility] PHYSICIAN:ZACHARY SWARTZ, MD  DISCHARGE SUMMARY  DATE OF DISCHARGE:  01/19/2018  DATE OF ADMISSION:  12/07/2017  DATE OF DISCHARGE:  01/19/2018  DISCHARGE DIAGNOSES:   1.  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  TLSO Back brace when out of bed. 2.  Acute mobile deep venous thrombosis right lower extremity common proximal femoral vein.  Inferior cava filter placement 12/08/2017 per interventional radiology as well as 01/16/2018 acute deep venous thrombosis, right posterior tibial vein, right  popliteal vein, right proximal profunda vein, right femoral vein, right common femoral vein, right peroneal vein as well as left lower extremity acute deep venous thrombosis left common femoral, left proximal profunda, left femoral vein, left popliteal  vein, left posterior tibial vein, left peroneal vein -- Xarelto 01/16/2018. 3.  Pain management. 4.  Mood. 5.  Neurogenic bowel and bladder. 6.  Hypertension. 7.  Acute blood loss anemia. 8.  Klebsiella urinary tract infection 9.  Hyponatremia  This is an 83 year old right-handed female with history of hypertension.  Lives with spouse.  Was independent prior to admission.  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  Presented 11/29/2017 after a fall with immediate  quadriparesis.  Cranial CT scan unremarkable for acute intracranial process.  CT and MRI and imaging showed C6-7 disk fracture anterior of the anterior longitudinal ligament.  Paraspinal muscle and intraspinous edema from C1-C7 compatible ligament injury  and muscle strain.  Increased cord signal at C5 and C6.  No cord hemorrhage and also noted T11 coronal vertebral body fracture.  Underwent C5-6, 6-7 anterior cervical  diskectomy and fusion 11/29/2017 per Dr. Zada Finders.  TLSO Back brace when out of bed  applied in a sitting position.  HOSPITAL COURSE:  Pain management.  Subcutaneous heparin was initiated for DVT prophylaxis.  The patient was admitted for comprehensive rehabilitation program.  PAST MEDICAL HISTORY:  See discharge diagnoses.  SOCIAL HISTORY:  Lives with spouse, independent prior to admission.  FUNCTIONAL STATUS:  Upon admission to rehab services was total assist lateral scoot transfers, total assist side lying to sitting with total assist for ADLs.  PHYSICAL EXAMINATION: VITAL SIGNS:  Blood pressure 110/59, pulse 84, temperature 99, respirations 18. GENERAL:  Alert female, oriented to person, place and time. HEENT:  EOMs intact. NECK:  Supple, nontender, no JVD. CARDIOVASCULAR:  Rate controlled. ABDOMEN:  Soft, nontender, good bowel sounds. LUNGS:  Clear to auscultation.  Dependent edema lower extremities.  REHABILITATION HOSPITAL COURSE:  The patient was admitted to inpatient rehabilitation services.  Therapies initiated on a 3-hour daily basis, consisting of physical therapy, occupational therapy and rehabilitation nursing.  The following issues were  addressed during patient's rehabilitation stay.  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  Surgical site healing nicely.  TLSO back brace when out of bed.  Initially on subcutaneous heparin for DVT prophylaxis.  Venous Doppler study showed mobile DVT right common and proximal femoral 12/08/2017.  There was noted patient with pork  allergy.  This was discussed at length thus an IVC filter was placed 12/08/2017 per Dr. Anselm Pancoast of interventional radiology.  During her rehabilitation course, noted some increased swelling of lower extremities.  Followup Doppler studies completed  12/16  findings consistent of right and left lower extremity with extensive DVTs, right posterior tibial vein, right popliteal vein, right proximal profunda and femoral vein, right common femoral vein and right peroneal vein as well as left lower extremity  acute DVT involving the left common femoral vein, left proximal profunda vein, left femoral vein, popliteal vein, left posterior tibial vein, left peroneal vein.  With these new findings during surgery, Dr. Zada Finders was notified.  Discussed Xarelto  which was initiated 01/16/2018.  During her rehabilitation course, ongoing issues of pain management.  She was using Neurontin 400 mg every 8 hours.  Initiation of Cymbalta.  She was using oxycodone for breakthrough pain.  Mood stabilization with  Cymbalta as well as neuropsychology followup.  Emotional support provided.  Neurogenic bowel and bladder.  She remained on Flomax every day 0.4 mg intermittent catheterization as directed.  A.m. bowel program with glycerin suppository every morning at  6:00 a.m.  FiberCon 625 mg daily.  Blood pressure is controlled and monitored with Norvasc.  Close monitoring for any orthostasis.  Acute blood loss anemia, stable at 9.4.  No signs of blood loss.  Hospital course Klebsiella UTI.  White blood cell count improved to 12,600. Mild hyponatremia 128-130  which could be followed up at skilled facility.there was some suggestion of possibly Cymbalta was causing hyponatremia this was discussed with family possible consideration of transitioning to Remeron. Current plan was to follow-up chemistries in one week while at skilled nursing facility and changes could be made at that time. .  The patient placed on Rocephin and transitioned to p.o.Keflex as white blood cell counts normalized .  She was afebrile.  The patient received weekly collaborative interdisciplinary team conferences to discuss estimated length of stay, family  teaching, any barriers to her discharge.  The patient  is total assist for mobility.  TLSO back brace in place.  She was being instructed on a power wheelchair.  Occupational therapy able to assist the patient with drinking water and feeding.  Sliding  board transfers, wheelchair to mat with max assist x2, sitting balance at edge of bed max assist, progressing to contact guard with focus on anterior weight shifting maintaining midline.  Power wheelchair mobility, she could propel to her therapy gym 150  feet supervision with occasional minimal assist for steering and verbal cues for obstacles.  Ongoing discussions held with the patient's family.  As her therapies progressed, it was felt skilled nursing facility was needed.  This again was discussed at  length with patient's family.  Bed becoming available 01/17/2018.  DISCHARGE MEDICATIONS:  Norvasc 10 mg p.o. daily at supper, Zostrix 0.025% topical twice daily, Cymbalta 20 mg p.o. daily, Neurontin 400 mg p.o. q.8h, glycerin suppository every morning at 6:00 a.m., Claritin 10 mg p.o. daily, multivitamin 1 tablet p.o.  daily, FiberCon 625 mg p.o. daily, potassium chloride 20 mEq p.o. daily, Xarelto 20 mg p.o. daily at supper, Flomax 0.4 mg p.o. daily,Keflex 250 mg every 8 hours 7 days, oxycodone 5 mg every 3 hours as needed for pain,Pyridium 100 mg 3 times a day 3 days.  DIET:  Regular.  FOLLOWUP:  The patient would follow up with Dr. Alger Simons at the outpatient rehab service office as directed; Dr. Emelda Brothers, call for appointment; Dr. Anselm Pancoast, interventional radiology.  SPECIAL INSTRUCTIONS:  TLSO back brace as directed when out of bed.  Continue in and out catheterizations every 6 hours, maintaining volumes of 350 mL.  Routine turning to maintain skin integrity.  LABS.  Follow-up chemistries Friday 01/20/2018  TN/NUANCE D:01/16/2018 T:01/16/2018 JOB:[id number]

## 2018-01-16 NOTE — Discharge Summary (Signed)
Discharge summary job # [id number]

## 2018-01-16 NOTE — Progress Notes (Signed)
Physical Therapy Session Note  Patient Details  Name: Sherry [name] MRN: [id number] Date of Birth: 29-Dec-1934  Today's Date: 01/16/2018 PT Individual Time: 1100-1200 PT Individual Time Calculation (min): 60 min   Short Term Goals: Week 5:  PT Short Term Goal 1 (Week 5): =LTG due to anticipated d/c to SNF  Skilled Therapeutic Interventions/Progress Updates:    Pt received seated in bed, complains of pain in BLE, RN notified and administers pain medication during therapy session. Pt very lethargic and depressed this AM. Tearful throughout session due to depression about her current situation mobility-wise. Provided emotional support to patient and played meaningful music throughout session for improved mood and therapy buy-in. ACE wrap to BLE for swelling management. Education with patient's family (step-daughter) about BLE PROM that family can provide when patient is not in therapy and they are available. Provided demonstration for family and they took video of demonstration, will continue to follow up as needed. Provided BLE and BUE PROM in all available planes of motion for pain relief and contracture management. Pt left seated in bed with needs in reach, bed alarm in place, family present at end of therapy session.  Therapy Documentation Precautions:  Precautions Precautions: Cervical, Back, Fall Precaution Comments: back precautions due to thoracic fx s/p ACDF; needs TLSO for all OOB activity but per order set does not need cervical collar  Required Braces or Orthoses: Spinal Brace Spinal Brace: Thoracolumbosacral orthotic, Applied in sitting position Restrictions Weight Bearing Restrictions: No Pain: Pain Assessment Pain Scale: 0-10 Pain Score: 7  Pain Type: Acute pain Pain Location: Generalized Pain Descriptors / Indicators: Aching Pain Frequency: Intermittent Patients Stated Pain Goal: 2 Pain Intervention(s): Medication (See eMAR)    Therapy/Group: Individual  Therapy  Excell Seltzer, PT, DPT  01/16/2018, 12:25 PM

## 2018-01-16 NOTE — Progress Notes (Signed)
Notified of results of doppler. + DVT throughout bil LE up to iliac vein. Alcide Goodness PAC notified of results. Reported Spoke w  MD and will order xarelto. Plan to d/c tomorrow as planned.  Sherry [name]

## 2018-01-16 NOTE — Progress Notes (Signed)
Occupational Therapy Session Note  Patient Details  Name: Sherry [name] MRN: [id number] Date of Birth: 08-Oct-1934  Today's Date: 01/16/2018 OT Individual Time: 7017-7939 OT Individual Time Calculation (min): 74 min   Short Term Goals: Week 3:  OT Short Term Goal 1 (Week 3): Pt will perform static sitting balance on EOB for 5 minutes with max A or less. OT Short Term Goal 1 - Progress (Week 3): Progressing toward goal OT Short Term Goal 2 (Week 3): Pt will perform UB dressing with mod A. OT Short Term Goal 2 - Progress (Week 3): Progressing toward goal OT Short Term Goal 4 (Week 3): STGs=LTGS due to ELOS Week 4:  OT Short Term Goal 1 (Week 4): STGs=LTGs due to ELOS  Skilled Therapeutic Interventions/Progress Updates:    Pt greeted in bed with HOB elevated, just starting lunch while using U cuff. Family present, leaving with OT arrival. Pt continued eating with encouragement and supervision-Min A to manage beverages and stabilize containers. She required significantly increased time due to weakness/fatigue, with pt reporting eating took a great deal of effort. Had her listen to meaningful music throughout tx to enhance affect. Provided her with therapy ipad to continue with music listening for positively impacting psychosocial health. Notified RN to keep music playing, and also to sit and talk with pt during shift for social participation/emotional support. RN verbalized understanding. Pt was left with all needs at session exit. Bed alarm set.   Therapy Documentation Precautions:  Precautions Precautions: Cervical, Back, Fall Precaution Comments: back precautions due to thoracic fx s/p ACDF; needs TLSO for all OOB activity but per order set does not need cervical collar  Required Braces or Orthoses: Spinal Brace Spinal Brace: Thoracolumbosacral orthotic, Applied in sitting position Restrictions Weight Bearing Restrictions: No Pain: Pt premedicated.  Pain Assessment Pain Scale:  0-10 Pain Score: 6  Pain Type: Acute pain Pain Location: Generalized Pain Descriptors / Indicators: Aching Pain Frequency: Intermittent Patients Stated Pain Goal: 2 Pain Intervention(s): Medication (See eMAR) ADL: ADL Equipment Provided: Other (comment)(dorsal wrist cuff) Eating: Supervision/safety Where Assessed-Eating: Wheelchair Grooming: Not assessed Where Assessed-Grooming: Bed level Upper Body Bathing: Moderate assistance Where Assessed-Upper Body Bathing: Bed level Lower Body Bathing: Other (comment), Maximal assistance(2 helpers) Where Assessed-Lower Body Bathing: Bed level Upper Body Dressing: Maximal assistance, Other (Comment)(2 helpers) Where Assessed-Upper Body Dressing: Bed level Lower Body Dressing: Maximal assistance, Other (Comment)(2 helpers) Where Assessed-Lower Body Dressing: Bed level Toileting: Not assessed Toilet Transfer: Not assessed Walk-In Shower Transfer: Not assessed :     Therapy/Group: Individual Therapy  Denice Cardon A Jamicheal Heard 01/16/2018, 2:32 PM

## 2018-01-16 NOTE — Progress Notes (Signed)
[facility] PHYSICAL MEDICINE & REHABILITATION PROGRESS NOTE   Subjective/Complaints: Had a busy night emptying bowels. Pain a little worse. Belly feels better after emptying stool.   ROS: Limited due to fatigue this morning     Objective:   Dg Abd 1 View  Result Date: 01/15/2018 CLINICAL DATA:  Abdominal distention EXAM: ABDOMEN - 1 VIEW COMPARISON:  None. FINDINGS: Large stool burden throughout the colon. No evidence of bowel obstruction or free air. No organomegaly or suspicious calcification. IVC filter in place. Left lower lobe atelectasis or infiltrate noted. Small left effusion. IMPRESSION: Large stool burden.  No obstruction or free air. Left lower lobe atelectasis or infiltrate with left effusion. Electronically Signed   By: Rolm Baptise M.D.   On: 01/15/2018 18:38   Recent Labs    01/15/18 0536  WBC 15.9*  HGB 9.7*  HCT 30.3*  PLT 622*   Recent Labs    01/15/18 0536  NA 130*  K 4.6  CL 95*  CO2 23  GLUCOSE 106*  BUN 19  CREATININE 0.39*  CALCIUM 8.1*    Intake/Output Summary (Last 24 hours) at 01/16/2018 0903 Last data filed at 01/16/2018 0846 Gross per 24 hour  Intake 222 ml  Output 1450 ml  Net -1228 ml     Physical Exam: Vital Signs Blood pressure 129/65, pulse (!) 101, temperature 99.3 F (37.4 C), temperature source Oral, resp. rate 20, height 5\' 7"  (1.702 m), weight 91.9 kg, SpO2 93 %. Constitutional: No distress . Vital signs reviewed. HEENT: EOMI, oral membranes moist Neck: supple Cardiovascular: RRR without murmur. No JVD    Respiratory: CTA Bilaterally without wheezes or rales. Normal effort    GI: BS +, non-tender, non-distended, belly soft  Ext: 2+ edema bilaterally with blisters on inner thighs still present   Neurological: arousal at baseline when I saw her today. Cognitively displays reasonable insight and awareness.  Sensation diminished to light touch below T4, stable.  Motor: RUE: Shoulder abduction, elbow flex 3+/5, elbow  extension, wrist extension 1+ to 2-/5, hand gip 0/5--trace  Finger flexion in all 4 digits on left and index finger on R--no obvious changes. LUE: Shoulder abduction, elbow flexion 3-/5, wrist extension 2/5, hand gip 0/5-motor exam stable.  R LE: 1+/5 proximal to distal, LLE 2/5 prox to 2/5 distally- stable exam.   Skin: see above Psychiatric: cooperative  Assessment/Plan: 1. 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  Physiatrist is providing close team supervision and 24 hour management of active medical problems listed below.  Physiatrist and rehab team continue to assess barriers to discharge/monitor patient progress toward functional and medical goals  Care Tool:  Bathing  Bathing activity did not occur: Refused Body parts bathed by patient: Right arm, Left arm, Chest, Face   Body parts bathed by helper: Abdomen, Front perineal area, Buttocks, Right upper leg, Left upper leg, Right lower leg, Left lower leg Body parts n/a: Face, Front perineal area, Buttocks(previously washed )   Bathing assist Assist Level: Moderate Assistance - Patient 50 - 74%     Upper Body Dressing/Undressing Upper body dressing   What is the patient wearing?: Pull over shirt    Upper body assist Assist Level: Maximal Assistance - Patient 25 - 49%    Lower Body Dressing/Undressing Lower body dressing      What is the patient wearing?: Pants     Lower body assist Assist for  lower body dressing: Total Assistance - Patient < 25%     Toileting Toileting    Toileting assist Assist for toileting: 2 Helpers     Transfers Chair/bed transfer  Transfers assist     Chair/bed transfer assist level: 2 Helpers     Locomotion Ambulation   Ambulation assist   Ambulation activity did not occur: Safety/medical concerns          Walk 10 feet activity   Assist   Walk 10 feet activity did not occur: Safety/medical concerns        Walk 50 feet activity   Assist Walk 50 feet with 2 turns activity did not occur: Safety/medical concerns         Walk 150 feet activity   Assist Walk 150 feet activity did not occur: Safety/medical concerns         Walk 10 feet on uneven surface  activity   Assist Walk 10 feet on uneven surfaces activity did not occur: Safety/medical concerns         Wheelchair     Assist Will patient use wheelchair at discharge?: Yes Type of Wheelchair: Power Wheelchair activity did not occur: Safety/medical concerns  Wheelchair assist level: Minimal Assistance - Patient > 75% Max wheelchair distance: 150'    Wheelchair 50 feet with 2 turns activity    Assist    Wheelchair 50 feet with 2 turns activity did not occur: Safety/medical concerns   Assist Level: Minimal Assistance - Patient > 75%   Wheelchair 150 feet activity     Assist Wheelchair 150 feet activity did not occur: Safety/medical concerns   Assist Level: Minimal Assistance - Patient > 75%     Medical Problem List and Plan: 1.  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  Status post C5-6 and C6-7 anterior cervical discectomy and fusion 11/29/2017.  TLSO back brace when out of bed     -Continue CIR therapies including PT, OT   -SNF pending on Tuesday, may have to hold pending medical 2.  DVT Prophylaxis/Anticoagulation:  Acute mobile DVT Right common/proximal femoral vein  -IVCF placed by radiology   -edema mgt has become an increasing problem.   -ordered re-doppler of LE's as I suspect that left leg is involved too now given her clinical appearance (first exam was limited LLE)  -will begin xarelto rx (pork allergy) pending discussion with NS 3. Pain Management:   Neurontin : change back to 400mg  TID   initiated cymbalta   oxycodone as needed for pain 4. Mood:   -ongoing ego  support, positive reinforcement  -neuropsych continues to follow  - trial of Cymbalta   - decreased arousal is multifactorial due to meds, UTI, mood   -have reduced gabapentin, stopped hs xanax, rx uti   -more alert overall 5. Neuropsych: This patient is capable of making decisions on her own behalf. 6. Skin/Wound Care: Routine skin checks  -edema: continue to elevate limbs when possible  -nutritionally much improved 7. Fluids/Electrolytes/Nutrition:    -replacing potassium  - protein for hypoalbuminemia  -labs all stable, eating very well   8.  Neurogenic bowel and bladder.  Flomax 0.4 mg daily.    -I/O caths  -KUB revealed stool throughout belly    -AM bowel program       -   senna at bedtime   -glycerin supp daily at 0600   - 0600 program so as to avoid conflict with therapy 9.  Hypertension.  Norvasc 10 mg  daily.   Vitals:   01/15/18 2129 01/16/18 0616  BP: (!) 118/55 129/65  Pulse: 90 (!) 101  Resp: 15 20  Temp: 98 F (36.7 C) 99.3 F (37.4 C)  SpO2: 94% 93%   Controlled on 12/15=6 10. Acute blood loss anemia:     -hgb 10.9  11/11--->10/6 11/29--> 11 12/4  -no signs of blood loss 11. Klebsiella UTI:   -wbc's 15k  -sens to ceftriaxone 1g q24 started 12/14   -give dose today and wbcs are down tomorrow will change to oral        LOS: 40 days A FACE TO FACE EVALUATION WAS PERFORMED  Meredith Staggers 01/16/2018, 9:03 AM

## 2018-01-16 NOTE — Progress Notes (Signed)
Bilateral lower extremity venous duplex completed.   Right: Findings consistent with acute deep vein thrombosis involving the right posterior tibial vein, right popliteal vein, right proximal profunda vein, right femoral vein, right common femoral vein, and right peroneal vein. No cystic structure found in the popliteal fossa.  Left: Findings consistent with acute deep vein thrombosis involving the left common femoral vein, left proximal profunda vein, left femoral vein, left popliteal vein, left posterior tibial vein, and left peroneal vein.   Right and left CIV and EIV were occluded.  IVC was difficult to visualize.   Preliminary report in CV Proc.   Sherry [name] 01/16/2018 4:00 PM

## 2018-01-16 NOTE — Progress Notes (Signed)
Occupational Therapy Session Note  Patient Details  Name: Sherry [name] MRN: [id number] Date of Birth: 12-Feb-1934  Today's Date: 01/16/2018 OT Individual Time: 5621-3086 OT Individual Time Calculation (min): 18 min  42 missed minutes secondary to fatigue  Short Term Goals: Week 5:  OT Short Term Goal 1 (Week 5): STGs=LTGs secondary to upcoming discharge  Skilled Therapeutic Interventions/Progress Updates:    Upon entering the room, pt supine in bed with daughter present in room. She reports pt having bad night with increased pain. Pt appearing lethargic this session. OT able to assist pt with drinking water and then she requested to remain resting. Pt with HOB elevated with call bell within reach. 42 missed minutes secondary to fatigue and increased pain.   Therapy Documentation Precautions:  Precautions Precautions: Cervical, Back, Fall Precaution Comments: back precautions due to thoracic fx s/p ACDF; needs TLSO for all OOB activity but per order set does not need cervical collar  Required Braces or Orthoses: Spinal Brace Spinal Brace: Thoracolumbosacral orthotic, Applied in sitting position Restrictions Weight Bearing Restrictions: No General: General OT Amount of Missed Time: 40 Minutes Vital Signs: Therapy Vitals Temp: 99.3 F (37.4 C) Temp Source: Oral Pulse Rate: (!) 101 Resp: 20 BP: 129/65 Patient Position (if appropriate): Lying Oxygen Therapy SpO2: 93 % O2 Device: Room Air Pain: Pain Assessment Pain Scale: 0-10 Pain Score: 10-Worst pain ever Pain Type: Acute pain Pain Location: Generalized Pain Descriptors / Indicators: Aching Pain Frequency: Intermittent Pain Onset: Awakened from sleep Pain Intervention(s): Medication (See eMAR) ADL: ADL Equipment Provided: Other (comment)(dorsal wrist cuff) Eating: Supervision/safety Where Assessed-Eating: Wheelchair Grooming: Not assessed Where Assessed-Grooming: Bed level Upper Body Bathing: Moderate  assistance Where Assessed-Upper Body Bathing: Bed level Lower Body Bathing: Other (comment), Maximal assistance(2 helpers) Where Assessed-Lower Body Bathing: Bed level Upper Body Dressing: Maximal assistance, Other (Comment)(2 helpers) Where Assessed-Upper Body Dressing: Bed level Lower Body Dressing: Maximal assistance, Other (Comment)(2 helpers) Where Assessed-Lower Body Dressing: Bed level Toileting: Not assessed Toilet Transfer: Not assessed Walk-In Shower Transfer: Not assessed   Therapy/Group: Individual Therapy  [name] 01/16/2018, 10:09 AM

## 2018-01-16 NOTE — Progress Notes (Signed)
Social Work Patient ID: Sherry [name], female   DOB: Sep 20, 1934, 83 y.o.   MRN: [id number]   Alerted by MD of new medical issues which may affect readiness for d/c to SNF tomorrow.  Pt, family and facility aware.  We keep all posted.  Yashika Mask, LCSW

## 2018-01-17 ENCOUNTER — Inpatient Hospital Stay (HOSPITAL_COMMUNITY): Payer: Medicare HMO | Admitting: Occupational Therapy

## 2018-01-17 ENCOUNTER — Encounter
Admission: RE | Admit: 2018-01-17 | Discharge: 2018-01-17 | Disposition: A | Discharge status: Home / Self Care | Payer: Medicare HMO | Source: Ambulatory Visit | Attending: Internal Medicine | Admitting: Internal Medicine

## 2018-01-17 LAB — BASIC METABOLIC PANEL
Anion gap: 11 (ref 5–15)
BUN: 18 mg/dL (ref 8–23)
CHLORIDE: 95 mmol/L — AB (ref 98–111)
CO2: 22 mmol/L (ref 22–32)
Calcium: 8.2 mg/dL — ABNORMAL LOW (ref 8.9–10.3)
Creatinine, Ser: 0.47 mg/dL (ref 0.44–1.00)
GFR calc Af Amer: >60 mL/min (ref >60)
GFR calc non Af Amer: >60 mL/min (ref >60)
GLUCOSE: 113 mg/dL — AB (ref 70–99)
POTASSIUM: 4.7 mmol/L (ref 3.5–5.1)
Sodium: 128 mmol/L — ABNORMAL LOW (ref 135–145)

## 2018-01-17 LAB — CBC
HEMATOCRIT: 30.4 % — AB (ref 36.0–46.0)
HEMOGLOBIN: 10.2 g/dL — AB (ref 12.0–15.0)
MCH: 27.9 pg (ref 26.0–34.0)
MCHC: 33.6 g/dL (ref 30.0–36.0)
MCV: 83.1 fL (ref 80.0–100.0)
Platelets: 692 10*3/uL — ABNORMAL HIGH (ref 150–400)
RBC: 3.66 MIL/uL — ABNORMAL LOW (ref 3.87–5.11)
RDW: 15.6 % — ABNORMAL HIGH (ref 11.5–15.5)
WBC: 14.7 10*3/uL — ABNORMAL HIGH (ref 4.0–10.5)
nRBC: 0 % (ref 0.0–0.2)

## 2018-01-17 NOTE — Progress Notes (Signed)
Occupational Therapy Session Note  Patient Details  Name: Sherry [name] MRN: [id number] Date of Birth: 1934/03/04  Today's Date: 01/17/2018 OT Individual Time: 3354-5625 OT Individual Time Calculation (min): 23 min  and Today's Date: 01/17/2018 OT Missed Time: 20 Minutes Missed Time Reason: Pain;Patient fatigue   Short Term Goals: Week 5:  OT Short Term Goal 1 (Week 5): STGs=LTGs secondary to upcoming discharge  Skilled Therapeutic Interventions/Progress Updates:    Upon entering the room, pt tilted in power chair and requests to remain in chair at this time. Pt reports 7/10 pain in B hands and RN notified. Ot encouraged pt to hold cup and take several sips of water as pt's mouth is very dry. OT assisted pt with applying lip balm to mouth as well.  OT placing hand mitts on B hands and covering with k pad per pt request. Pt requesting to remain tilted in chair in current position and declined OT intervention further. RN and daughter arrived to room as therapist exiting.   Therapy Documentation Precautions:  Precautions Precautions: Cervical, Back, Fall Precaution Comments: back precautions due to thoracic fx s/p ACDF; needs TLSO for all OOB activity but per order set does not need cervical collar  Required Braces or Orthoses: Spinal Brace Spinal Brace: Thoracolumbosacral orthotic, Applied in sitting position Restrictions Weight Bearing Restrictions: No General: General OT Amount of Missed Time: 20 Minutes PT Missed Treatment Reason: Patient fatigue Vital Signs:   Pain: Pain Assessment Pain Scale: 0-10 Pain Score: 6  Pain Location: Generalized Pain Descriptors / Indicators: Aching Patients Stated Pain Goal: 3 Pain Intervention(s): Medication (See eMAR) ADL: ADL Equipment Provided: Other (comment)(dorsal wrist cuff) Eating: Supervision/safety Where Assessed-Eating: Wheelchair Grooming: Not assessed Where Assessed-Grooming: Bed level Upper Body Bathing: Moderate  assistance Where Assessed-Upper Body Bathing: Bed level Lower Body Bathing: Other (comment), Maximal assistance(2 helpers) Where Assessed-Lower Body Bathing: Bed level Upper Body Dressing: Maximal assistance, Other (Comment)(2 helpers) Where Assessed-Upper Body Dressing: Bed level Lower Body Dressing: Maximal assistance, Other (Comment)(2 helpers) Where Assessed-Lower Body Dressing: Bed level Toileting: Not assessed Toilet Transfer: Not assessed Walk-In Shower Transfer: Not assessed   Therapy/Group: Individual Therapy  [name] 01/17/2018, 11:38 AM

## 2018-01-17 NOTE — Progress Notes (Signed)
Physical Therapy Session Note  Patient Details  Name: Sherry [name] MRN: [id number] Date of Birth: [birth year]  Today's Date: 01/17/2018 PT Individual Time: 0930-1000 PT Individual Time Calculation (min): 30 min   Short Term Goals: Week 5:  PT Short Term Goal 1 (Week 5): =LTG due to anticipated d/c to SNF  Skilled Therapeutic Interventions/Progress Updates:    Handoff from OT with pt in bed. Per PA and MD, d/c on hold for today due to medical issues but ok for therapies to tolerance.   Focused session on functional bed mobility, slideboard transfer OOB to power w/c, and w/c mobility on unit. Pt required +2 assist for log roll technique and supine to sit with verbal cues for technique and hand placement. Total assist to don TLSO EOB. Slideboard transfer to w/c with total assist +2 for board management, transfer and repositioning and verbal cues for technique and facilitation for anterior weightshift. Pt requires A with management of power w/c in tight spaces, but able to drive with S in hallways and around obstacles x 150'.  Pt declines further intervention due to fatigue and not feeling well. Pt missed 45 min of skilled PT session.  Therapy Documentation Precautions:  Precautions Precautions: Cervical, Back, Fall Precaution Comments: back precautions due to thoracic fx s/p ACDF; needs TLSO for all OOB activity but per order set does not need cervical collar  Required Braces or Orthoses: Spinal Brace Spinal Brace: Thoracolumbosacral orthotic, Applied in sitting position Restrictions Weight Bearing Restrictions: No   Pain: Pain Assessment Pain Scale: 0-10 Pain Score: 6  Pain Location: Generalized Pain Descriptors / Indicators: Aching Patients Stated Pain Goal: 3 Pain Intervention(s): Medication (See eMAR)   Therapy/Group: Individual Therapy  Canary Brim Ivory Broad, PT, DPT, CBIS  01/17/2018, 10:13 AM

## 2018-01-17 NOTE — Progress Notes (Signed)
Nutrition Follow-up  INTERVENTION:   - Continue MVI with minerals daily  - Continue Pro-stat 30 ml BID, each supplement provides 100 kcal and 15 grams of protein  - Continue afternoon snack daily  NUTRITION DIAGNOSIS:   Inadequate oral intake related to poor appetite, early satiety as evidenced by per patient/family report.  Progressing  GOAL:   Patient will meet greater than or equal to 90% of their needs  Progressing  MONITOR:   PO intake, Labs, Skin, I & O's, Weight trends  REASON FOR ASSESSMENT:   Consult Poor PO  ASSESSMENT:   83 year old female with PMH significant for hypertension. Pt presented on 11/29/2017 after a fall with immediate quadriparesis. Cranial CT scan unremarkable for acute intracranial process. CT/MRI imaging showed C6-7 disc fracture and tear of anterior longitudinal ligament. Paraspinal muscle and interspinous edema from C1-C7 compatible ligament injury and muscle strain. Increased cord signal at C5 and C6. T11 coronal vertebral body fracture. Pt underwent C5-6, C6-7 anterior cervical discectomy and fusion on 11/29/2017 per Dr. Venetia Constable.  Noted pt found to have numerous bilateral LE DVTs by doppler yesterday.  Discussed pt with RN. Per RN, discharge to SNF has been delayed, likely tomorrow.  Weight stable since last RD visit.  Spoke with pt and daughter at bedside. Pt and daughter confirm pt with good PO intake. Pt with complaints of abdominal tightness and eats less at meals because of this. Pt receiving snacks and Pro-stat. Pt also drinking smoothie from home. Pt and daughter with no nutrition-related concerns.  Meal Completion: 20-90% x last 8 meals  Medications reviewed and include: Pro-stat BID, liquid MVI daily, Fibercon daily, K-dur 20 mEq daily, IV antibiotics  Labs reviewed: sodium 128 (L)  UOP: 1280 ml x 24 hours   Diet Order:   Diet Order            Diet regular Room service appropriate? Yes; Fluid consistency: Thin  Diet  effective now              EDUCATION NEEDS:   Education needs have been addressed  Skin:  Skin Assessment: Skin Integrity Issues: DTI: right heel Incisions: left groin, neck Other: laceration to left elbow  Last BM:  12/16 (type 6)  Height:   Ht Readings from Last 1 Encounters:  12/22/17 5\' 7"  (1.702 m)    Weight:   Wt Readings from Last 1 Encounters:  01/17/18 89.7 kg    Ideal Body Weight:  61.36 kg  BMI:  Body mass index is 30.97 kg/m.  Estimated Nutritional Needs:   Kcal:  1500-1700  Protein:  80-95 grams  Fluid:  1.5-1.7 L    Gaynell Face, MS, RD, LDN Inpatient Clinical Dietitian Pager: [phone] Weekend/After Hours: [phone]

## 2018-01-17 NOTE — Progress Notes (Signed)
[facility] PHYSICAL MEDICINE & REHABILITATION PROGRESS NOTE   Subjective/Complaints: No new problems overnight  ROS: Patient denies fever, rash, sore throat, blurred vision, nausea, vomiting, diarrhea, cough, shortness of breath or chest pain,  Headache .     Objective:   Dg Abd 1 View  Result Date: 01/15/2018 CLINICAL DATA:  Abdominal distention EXAM: ABDOMEN - 1 VIEW COMPARISON:  None. FINDINGS: Large stool burden throughout the colon. No evidence of bowel obstruction or free air. No organomegaly or suspicious calcification. IVC filter in place. Left lower lobe atelectasis or infiltrate noted. Small left effusion. IMPRESSION: Large stool burden.  No obstruction or free air. Left lower lobe atelectasis or infiltrate with left effusion. Electronically Signed   By: Rolm Baptise M.D.   On: 01/15/2018 18:38   Vas [place] Lower Extremity Venous (dvt)  Result Date: 01/16/2018  Lower Venous Study Indications: Swelling.  Performing Technologist: Abram Sander RVS  Examination Guidelines: A complete evaluation includes B-mode imaging, spectral Doppler, color Doppler, and power Doppler as needed of all accessible portions of each vessel. Bilateral testing is considered an integral part of a complete examination. Limited examinations for reoccurring indications may be performed as noted.  Right Venous Findings: +---------+---------------+---------+-----------+----------+-------+          CompressibilityPhasicitySpontaneityPropertiesSummary +---------+---------------+---------+-----------+----------+-------+ CFV      None           No       No                   Acute   +---------+---------------+---------+-----------+----------+-------+ SFJ      None                                         Acute   +---------+---------------+---------+-----------+----------+-------+ FV Prox  None                                         Acute    +---------+---------------+---------+-----------+----------+-------+ FV Mid   None                                         Acute   +---------+---------------+---------+-----------+----------+-------+ FV DistalNone                                         Acute   +---------+---------------+---------+-----------+----------+-------+ PFV      None                                         Acute   +---------+---------------+---------+-----------+----------+-------+ POP      None           No       No                   Acute   +---------+---------------+---------+-----------+----------+-------+ PTV      None  Acute   +---------+---------------+---------+-----------+----------+-------+ PERO     None                                         Acute   +---------+---------------+---------+-----------+----------+-------+ CIV/EIV  None           No       No                   Acute   +---------+---------------+---------+-----------+----------+-------+ IVC was difficult to visualize.  Left Venous Findings: +---------+---------------+---------+-----------+----------+-------+          CompressibilityPhasicitySpontaneityPropertiesSummary +---------+---------------+---------+-----------+----------+-------+ CFV      None           No       No                   Acute   +---------+---------------+---------+-----------+----------+-------+ SFJ      None                                         Acute   +---------+---------------+---------+-----------+----------+-------+ FV Prox  None                                         Acute   +---------+---------------+---------+-----------+----------+-------+ FV Mid   None                                         Acute   +---------+---------------+---------+-----------+----------+-------+ FV DistalNone                                         Acute    +---------+---------------+---------+-----------+----------+-------+ PFV      None                                         Acute   +---------+---------------+---------+-----------+----------+-------+ POP      None           No       No                   Acute   +---------+---------------+---------+-----------+----------+-------+ PTV      None                                         Acute   +---------+---------------+---------+-----------+----------+-------+ PERO     None                                         Acute   +---------+---------------+---------+-----------+----------+-------+ CIV/EIV                 No       No  Acute   +---------+---------------+---------+-----------+----------+-------+ IVC was difficult to visualize.    Summary: Right: Findings consistent with acute deep vein thrombosis involving the right posterior tibial vein, right popliteal vein, right proximal profunda vein, right femoral vein, right common femoral vein, and right peroneal vein. No cystic structure found in  the popliteal fossa. Left: Findings consistent with acute deep vein thrombosis involving the left common femoral vein, left proximal profunda vein, left femoral vein, left popliteal vein, left posterior tibial vein, and left peroneal vein.  *See table(s) above for measurements and observations.    Preliminary    Recent Labs    01/15/18 0536 01/17/18 0556  WBC 15.9* 14.7*  HGB 9.7* 10.2*  HCT 30.3* 30.4*  PLT 622* 692*   Recent Labs    01/15/18 0536 01/17/18 0556  NA 130* 128*  K 4.6 4.7  CL 95* 95*  CO2 23 22  GLUCOSE 106* 113*  BUN 19 18  CREATININE 0.39* 0.47  CALCIUM 8.1* 8.2*    Intake/Output Summary (Last 24 hours) at 01/17/2018 0821 Last data filed at 01/17/2018 0518 Gross per 24 hour  Intake 442 ml  Output 1280 ml  Net -838 ml     Physical Exam: Vital Signs Blood pressure 115/66, pulse 92, temperature 98 F (36.7 C), resp. rate 18,  height 5\' 7"  (1.702 m), weight 89.7 kg, SpO2 93 %. Constitutional: No distress . Vital signs reviewed. HEENT: EOMI, oral membranes moist Neck: supple Cardiovascular: RRR without murmur. No JVD    Respiratory: CTA Bilaterally without wheezes or rales. Normal effort    GI: BS +, non-tender, non-distended   Ext: 2++ edema bilaterally with blisters on inner thighs unchanged Neurological:  Awake and alert.  Cognitively displays reasonable insight and awareness.  Sensation diminished to light touch below T4, stable.  Motor: RUE: Shoulder abduction, elbow flex 3+/5, elbow extension, wrist extension 1+ to 2-/5, hand gip 0/5--trace  Finger flexion in all 4 digits on left and index finger on R--no obvious changes. LUE: Shoulder abduction, elbow flexion 3-/5, wrist extension 2/5, hand gip 0/5-motor exam unchanged.  R LE: 1+/5 proximal to distal, LLE 2/5 prox to 2/5 distally- stable exam.   Skin: see above Psychiatric: cooperative, flat  Assessment/Plan: 1. 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  Physiatrist is providing close team supervision and 24 hour management of active medical problems listed below.  Physiatrist and rehab team continue to assess barriers to discharge/monitor patient progress toward functional and medical goals  Care Tool:  Bathing  Bathing activity did not occur: Refused Body parts bathed by patient: Right arm, Left arm, Chest, Face   Body parts bathed by helper: Abdomen, Front perineal area, Buttocks, Right upper leg, Left upper leg, Right lower leg, Left lower leg Body parts n/a: Face, Front perineal area, Buttocks(previously washed )   Bathing assist Assist Level: Moderate Assistance - Patient 50 - 74%     Upper Body Dressing/Undressing Upper body dressing   What is the patient wearing?: Pull over shirt    Upper body assist Assist  Level: Maximal Assistance - Patient 25 - 49%    Lower Body Dressing/Undressing Lower body dressing      What is the patient wearing?: Pants     Lower body assist Assist for lower body dressing: Total Assistance - Patient < 25%     Toileting Toileting    Toileting assist Assist for toileting: 2 Helpers  Transfers Chair/bed transfer  Transfers assist     Chair/bed transfer assist level: 2 Helpers     Locomotion Ambulation   Ambulation assist   Ambulation activity did not occur: Safety/medical concerns          Walk 10 feet activity   Assist  Walk 10 feet activity did not occur: Safety/medical concerns        Walk 50 feet activity   Assist Walk 50 feet with 2 turns activity did not occur: Safety/medical concerns         Walk 150 feet activity   Assist Walk 150 feet activity did not occur: Safety/medical concerns         Walk 10 feet on uneven surface  activity   Assist Walk 10 feet on uneven surfaces activity did not occur: Safety/medical concerns         Wheelchair     Assist Will patient use wheelchair at discharge?: Yes Type of Wheelchair: Power Wheelchair activity did not occur: Safety/medical concerns  Wheelchair assist level: Minimal Assistance - Patient > 75% Max wheelchair distance: 150'    Wheelchair 50 feet with 2 turns activity    Assist    Wheelchair 50 feet with 2 turns activity did not occur: Safety/medical concerns   Assist Level: Minimal Assistance - Patient > 75%   Wheelchair 150 feet activity     Assist Wheelchair 150 feet activity did not occur: Safety/medical concerns   Assist Level: Minimal Assistance - Patient > 75%     Medical Problem List and Plan: 1.  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  Status post C5-6 and C6-7 anterior cervical discectomy and fusion 11/29/2017.  TLSO back brace when out of bed     -dc to SNF soon.  2.   DVT Prophylaxis/Anticoagulation:  Numerous bilateral LE DVT's by doppler yesterday  -IVCF placed by radiology   -edema mgt has become an increasing problem.   -continue xarelto 3. Pain Management:   Neurontin : change back to 400mg  TID   initiated cymbalta   oxycodone as needed for pain 4. Mood:   -ongoing ego support, positive reinforcement  -neuropsych continues to follow  - trial of Cymbalta  5. Neuropsych: This patient is capable of making decisions on her own behalf. 6. Skin/Wound Care: Routine skin checks  -edema: continue to elevate limbs when possible  -nutritionally much improved 7. Fluids/Electrolytes/Nutrition:    -replacing potassium  - protein for hypoalbuminemia      8.  Neurogenic bowel and bladder.  Flomax 0.4 mg daily.    -I/O caths  -KUB revealed stool throughout belly    -AM bowel program       -   senna at bedtime   -glycerin supp daily at 0600   - 0600 program so as to avoid conflict with therapy 9.  Hypertension.  Norvasc 10 mg daily.   Vitals:   01/16/18 1958 01/17/18 0525  BP: (!) 111/58 115/66  Pulse: 91 92  Resp:  18  Temp: 97.7 F (36.5 C) 98 F (36.7 C)  SpO2: 95% 93%   Controlled on 12/17 10. Acute blood loss anemia:     -hgb 10.9  11/11--->10/6 11/29--> 11 12/4  -no signs of blood loss 11. Klebsiella UTI:   -wbc's 15.9 12/15---> 14.7 today  -sens to ceftriaxone 1g q24 started 12/14---change to keflex today     12. Hyponatremia:  -may be due to infection  -also have to consider cymbalta as a  source.         LOS: 41 days A FACE TO [place] 01/17/2018, 8:21 AM

## 2018-01-17 NOTE — Progress Notes (Signed)
Occupational Therapy Session Note  Patient Details  Name: Sherry [name] MRN: [id number] Date of Birth: 1935/01/14  Today's Date: 01/17/2018 OT Individual Time: 5809-9833 OT Individual Time Calculation (min): 60 min    Short Term Goals: Week 3:  OT Short Term Goal 1 (Week 3): Pt will perform static sitting balance on EOB for 5 minutes with max A or less. OT Short Term Goal 1 - Progress (Week 3): Progressing toward goal OT Short Term Goal 2 (Week 3): Pt will perform UB dressing with mod A. OT Short Term Goal 2 - Progress (Week 3): Progressing toward goal OT Short Term Goal 4 (Week 3): STGs=LTGS due to ELOS Week 4:  OT Short Term Goal 1 (Week 4): STGs=LTGs due to ELOS  Skilled Therapeutic Interventions/Progress Updates:    1:1 Pt tearful this morning, reporting not feeling well today but agreeable to working with therapy. Pt continues to present with swelling in abdomen and bilateral LEs. PT agreeable to donning LB clothing getting up into power chair. While donning TEDS and ace wraps pt had an incontinent bowel movement. Focus on rolling in the bed with pt recalling knowledge of how to help position UB and instruct caregivers how to assist LEs. Max A for rolling and max A for maintaining positioning to allow for second person to assist with peri hygiene and don new brief and pants. Engaged pt in social conversation to take mind off pain and discomfort. Due to time unable to achieve getting to the power chair but made plans with next therapy session with PT to get up for the day  Into the w/c. Left resting in bed.  Therapy Documentation Precautions:  Precautions Precautions: Cervical, Back, Fall Precaution Comments: back precautions due to thoracic fx s/p ACDF; needs TLSO for all OOB activity but per order set does not need cervical collar  Required Braces or Orthoses: Spinal Brace Spinal Brace: Thoracolumbosacral orthotic, Applied in sitting position Restrictions Weight Bearing  Restrictions: No Pain:  Pt reports pain in her back and pressure in her abdomen. RN made aware and requested pain meds.  A with repositioning   Therapy/Group: Individual Therapy  W[facility]en [facility] 01/17/2018, 6:36 PM

## 2018-01-18 ENCOUNTER — Inpatient Hospital Stay (HOSPITAL_COMMUNITY): Payer: Medicare HMO | Admitting: Occupational Therapy

## 2018-01-18 LAB — BASIC METABOLIC PANEL
Anion gap: 9 (ref 5–15)
BUN: 17 mg/dL (ref 8–23)
CALCIUM: 8.1 mg/dL — AB (ref 8.9–10.3)
CO2: 24 mmol/L (ref 22–32)
Chloride: 97 mmol/L — ABNORMAL LOW (ref 98–111)
Creatinine, Ser: 0.38 mg/dL — ABNORMAL LOW (ref 0.44–1.00)
GFR calc Af Amer: >60 mL/min (ref >60)
GFR calc non Af Amer: >60 mL/min (ref >60)
Glucose, Bld: 99 mg/dL (ref 70–99)
Potassium: 4.4 mmol/L (ref 3.5–5.1)
Sodium: 130 mmol/L — ABNORMAL LOW (ref 135–145)

## 2018-01-18 LAB — CBC
HCT: 29 % — ABNORMAL LOW (ref 36.0–46.0)
Hemoglobin: 9.4 g/dL — ABNORMAL LOW (ref 12.0–15.0)
MCH: 27.2 pg (ref 26.0–34.0)
MCHC: 32.4 g/dL (ref 30.0–36.0)
MCV: 83.8 fL (ref 80.0–100.0)
Platelets: 665 10*3/uL — ABNORMAL HIGH (ref 150–400)
RBC: 3.46 MIL/uL — ABNORMAL LOW (ref 3.87–5.11)
RDW: 15.7 % — ABNORMAL HIGH (ref 11.5–15.5)
WBC: 12.6 10*3/uL — ABNORMAL HIGH (ref 4.0–10.5)
nRBC: 0 % (ref 0.0–0.2)

## 2018-01-18 MED ORDER — POLYETHYLENE GLYCOL 3350 17 G PO PACK: 17.0000 g | PACK | Freq: Every day | ORAL | 0 refills | Status: DC | PRN

## 2018-01-18 MED ORDER — GLYCERIN (LAXATIVE) 2.1 G RE SUPP: 1.0000 | Freq: Every day | RECTAL | 0 refills | Status: DC

## 2018-01-18 MED ORDER — CALCIUM POLYCARBOPHIL 625 MG PO TABS: 625.0000 mg | ORAL_TABLET | Freq: Every day | ORAL | Status: DC

## 2018-01-18 MED ORDER — CEPHALEXIN 250 MG PO CAPS
250.0000 mg | ORAL_CAPSULE | Freq: Three times a day (TID) | ORAL | Status: DC
  Administered 2018-01-18 – 2018-01-19 (×5): 250 mg via ORAL
  Filled 2018-01-18 (×5): qty 1

## 2018-01-18 MED ORDER — POTASSIUM CHLORIDE CRYS ER 20 MEQ PO TBCR: 20.0000 meq | EXTENDED_RELEASE_TABLET | Freq: Every day | ORAL | Status: DC

## 2018-01-18 MED ORDER — CAPSAICIN 0.025 % EX CREA: TOPICAL_CREAM | Freq: Two times a day (BID) | CUTANEOUS | 0 refills | Status: DC

## 2018-01-18 MED ORDER — CEPHALEXIN 250 MG PO CAPS: 250.0000 mg | ORAL_CAPSULE | Freq: Three times a day (TID) | ORAL | Status: DC

## 2018-01-18 MED ORDER — RIVAROXABAN 20 MG PO TABS: 20.0000 mg | ORAL_TABLET | Freq: Every day | ORAL | Status: DC

## 2018-01-18 MED ORDER — DM-GUAIFENESIN ER 30-600 MG PO TB12: 1.0000 | ORAL_TABLET | Freq: Two times a day (BID) | ORAL | Status: DC

## 2018-01-18 MED ORDER — ACETAMINOPHEN 325 MG PO TABS: 650.0000 mg | ORAL_TABLET | ORAL | Status: DC | PRN

## 2018-01-18 MED ORDER — DULOXETINE HCL 20 MG PO CPEP: 20.0000 mg | ORAL_CAPSULE | Freq: Every day | ORAL | 3 refills | Status: DC

## 2018-01-18 MED ORDER — OXYCODONE HCL 5 MG PO TABS: 5.0000 mg | ORAL_TABLET | ORAL | 0 refills | Status: DC | PRN

## 2018-01-18 MED ORDER — GABAPENTIN 400 MG PO CAPS: 400.0000 mg | ORAL_CAPSULE | Freq: Three times a day (TID) | ORAL | Status: DC

## 2018-01-18 NOTE — Progress Notes (Signed)
Social Work Patient ID: Sherry [name], female   DOB: 10-04-1934, 83 y.o.   MRN: [id number]   Received notice this afternoon that SNF cannot admit pt until tomorrow.  Pt and daughter aware.  MD/ team aware.  Will transfer tomorrow morning via ambulance.  Berta Denson, LCSW

## 2018-01-18 NOTE — Plan of Care (Signed)
  Problem: RH PAIN MANAGEMENT Goal: RH STG PAIN MANAGED AT OR BELOW PT'S PAIN GOAL Description At or below level 6  Outcome: Progressing  Assess pain, administers pain regimen as needed

## 2018-01-18 NOTE — Progress Notes (Signed)
Occupational Therapy Discharge Summary  Patient Details  Name: Sherry [name] MRN: [id number] Date of Birth: 07/21/1934   Patient has met 5 of 7 long term goals due to improved activity tolerance, improved balance, ability to compensate for deficits and improved coordination.  Patient to discharge at overall max - total A level. Pt discharging to SNF for further rehab to address functional deficits and family education  Reasons goals not met: Pt needing +2 assistance for toileting and hygiene for safety  Recommendation:  Patient will benefit from ongoing skilled OT services in skilled nursing facility setting to continue to advance functional skills in the area of Reduce care partner burden.  Equipment: defer to next venue of care  Reasons for discharge: slow progress towards remaining goals  Patient/family agrees with progress made and goals achieved: Yes  OT Discharge Precautions/Restrictions  Precautions Precautions: Cervical;Back;Fall Precaution Comments: back precautions due to thoracic fx s/p ACDF; needs TLSO for all OOB activity but per order set does not need cervical collar  Required Braces or Orthoses: Spinal Brace Spinal Brace: Thoracolumbosacral orthotic;Applied in sitting position \ADL ADL Equipment Provided: Other (comment)(dorsal wrist cuff) Eating: Supervision/safety Where Assessed-Eating: Wheelchair Grooming: Not assessed Where Assessed-Grooming: Bed level Upper Body Bathing: Moderate assistance Where Assessed-Upper Body Bathing: Bed level Lower Body Bathing: Other (comment), Maximal assistance(2 helpers) Where Assessed-Lower Body Bathing: Bed level Upper Body Dressing: Maximal assistance, Other (Comment)(2 helpers) Where Assessed-Upper Body Dressing: Bed level Lower Body Dressing: Maximal assistance, Other (Comment)(2 helpers) Where Assessed-Lower Body Dressing: Bed level Toileting: Not assessed Toilet Transfer: Not assessed Walk-In Shower Transfer:  Not assessed Vision Baseline Vision/History: Wears glasses Wears Glasses: At all times Patient Visual Report: No change from baseline Cognition Overall Cognitive Status: Within Functional Limits for tasks assessed Arousal/Alertness: Awake/alert Orientation Level: Oriented X4 Sensation Sensation Light Touch: Impaired Detail Central sensation comments: incomplete impairment C6 and down Light Touch Impaired Details: Absent RLE;Absent LLE Proprioception: Impaired Detail Proprioception Impaired Details: Absent RLE;Absent LLE Coordination Gross Motor Movements are Fluid and Coordinated: No Fine Motor Movements are Fluid and Coordinated: No Mobility  Bed Mobility Bed Mobility: Supine to Sit;Sit to Supine Supine to Sit: Dependent - Patient equal 0% Sit to Supine: Dependent - Patient equal 0%  Trunk/Postural Assessment  Cervical Assessment Cervical Assessment: Exceptions to WFL Thoracic Assessment Thoracic Assessment: Exceptions to WFL Lumbar Assessment Lumbar Assessment: Exceptions to WFL  Balance Balance Balance Assessed: Yes Static Sitting Balance Static Sitting - Balance Support: Bilateral upper extremity supported;Feet supported Dynamic Sitting Balance Dynamic Sitting - Balance Support: No upper extremity supported;Feet supported Dynamic Sitting - Level of Assistance: 3: Mod assist;2: Max assist Extremity/Trunk Assessment RUE Assessment RUE Assessment: Exceptions to WFL General Strength Comments: 2-/5 grossly LUE Assessment General Strength Comments: 2-/5 grossly   Bradsher, Katie P 01/18/2018, 10:02 AM  

## 2018-01-18 NOTE — Progress Notes (Signed)
Deficit of knowledge r/t pain management and condition progression.   Noted pt increasing of pain regimen needs have increased, she requesting meds before every 3 hour. It appeared patient isn't resting/sleeping tonight. Before administering pain regimen at 0220, retrieved vs which was WNL.   Spoke with the daughter about her increasing pain. After speaking with the daughter, she noted she feared her mother's conditions and they had some bad experiences with pain meds not been given. She shared her mother has been cried to her wanted to die resulted to her pain and perception of staff not administering her meds promptly. Writer educated the daughter sometimes its difficult to be on time because the needs of other patients. Writer apologize to her and encouraged at any time, no matter where you are at "please address issues like this with management."

## 2018-01-18 NOTE — Progress Notes (Signed)
Physical Therapy Discharge Summary  Patient Details  Name: Sherry [name] MRN: [id number] Date of Birth: 1934/12/21  Today's Date: 01/18/2018   Patient has met 2 of 4 long term goals due to improved postural control, increased strength and decreased pain.  Patient to discharge at a wheelchair level Total Assist.   Patient's care partner unable to provide the necessary physical assistance at discharge.  Reasons goals not met: Requires +2A for transfers for safety  Recommendation:  Patient will benefit from ongoing skilled PT services in skilled nursing facility setting to continue to advance safe functional mobility, address ongoing impairments in strength, ROM, coordination, activity tolerance, balance, and minimize fall risk.  Equipment: No equipment provided  Reasons for discharge: treatment goals met and discharge from hospital  Patient/family agrees with progress made and goals achieved: Yes  PT Discharge Precautions/Restrictions Precautions Precautions: Cervical;Back;Fall Precaution Comments: back precautions due to thoracic fx s/p ACDF; needs TLSO for all OOB activity but per order set does not need cervical collar  Required Braces or Orthoses: Spinal Brace Spinal Brace: Thoracolumbosacral orthotic;Applied in sitting position  Cognition Overall Cognitive Status: Within Functional Limits for tasks assessed Arousal/Alertness: Awake/alert Orientation Level: Oriented X4 Sensation Sensation Light Touch: Impaired Detail Central sensation comments: incomplete impairment C6 and down Light Touch Impaired Details: Absent RLE;Absent LLE Proprioception: Impaired Detail Proprioception Impaired Details: Absent RLE;Absent LLE Coordination Gross Motor Movements are Fluid and Coordinated: No Fine Motor Movements are Fluid and Coordinated: No Motor  Motor Motor - Discharge Observations: incomplete quadriparesis C6  Mobility Bed Mobility Bed Mobility: Supine to Sit;Sit to  Supine Supine to Sit: Dependent - Patient equal 0% Sit to Supine: Dependent - Patient equal 0% Transfers Lateral/Scoot Transfers: 2 Helpers;Other/comment Locomotion  Gait Ambulation: No Gait Gait: No Stairs / Additional Locomotion Stairs: No Wheelchair Mobility Wheelchair Mobility: Yes Wheelchair Assistance: Contact Guard/Touching assist(S overall in controlled environment; occasional min guard in crowded environments and when pt less attentive) Wheelchair Propulsion: Right upper extremity;Power Wheelchair Parts Management: Other (comment)(dependent) Distance: >150'  Trunk/Postural Assessment  Cervical Assessment Cervical Assessment: Exceptions to [facility] Thoracic Assessment Thoracic Assessment: Exceptions to [facility] Lumbar Assessment Lumbar Assessment: Exceptions to [facility]  Balance Balance Balance Assessed: Yes Static Sitting Balance Static Sitting - Balance Support: Bilateral upper extremity supported;Feet supported Dynamic Sitting Balance Dynamic Sitting - Balance Support: No upper extremity supported;Feet supported Dynamic Sitting - Level of Assistance: 3: Mod assist;2: Max assist Extremity Assessment  RUE Assessment RUE Assessment: Exceptions to [facility] General Strength Comments: 2-/5 grossly LUE Assessment General Strength Comments: 2-/5 grossly RLE Assessment RLE Assessment: Exceptions to [facility] General Strength Comments: 2/5 R knee extension, 1/5 hip extension, 0/5 ankle, 0/5 hip flexion LLE Assessment LLE Assessment: Exceptions to [facility] General Strength Comments: 1/5 hip flexion, 3/5 knee extension, 1/5 knee flexion, 0/5 ankle, able to wiggle toes    Corliss Skains 01/18/2018, 12:51 PM

## 2018-01-18 NOTE — Progress Notes (Signed)
[place] PHYSICAL MEDICINE & REHABILITATION PROGRESS NOTE   Subjective/Complaints: Had a reasonable night. Pain stable  ROS: Patient denies fever, rash, sore throat, blurred vision, nausea, vomiting, diarrhea, cough, shortness of breath or chest pain,  headache, or mood change.     Objective:   Vas [place] Lower Extremity Venous (dvt)  Result Date: 01/17/2018  Lower Venous Study Indications: Swelling.  Performing Technologist: Abram Sander RVS  Examination Guidelines: A complete evaluation includes B-mode imaging, spectral Doppler, color Doppler, and power Doppler as needed of all accessible portions of each vessel. Bilateral testing is considered an integral part of a complete examination. Limited examinations for reoccurring indications may be performed as noted.  Right Venous Findings: +---------+---------------+---------+-----------+----------+-------+          CompressibilityPhasicitySpontaneityPropertiesSummary +---------+---------------+---------+-----------+----------+-------+ CFV      None           No       No                   Acute   +---------+---------------+---------+-----------+----------+-------+ SFJ      None                                         Acute   +---------+---------------+---------+-----------+----------+-------+ FV Prox  None                                         Acute   +---------+---------------+---------+-----------+----------+-------+ FV Mid   None                                         Acute   +---------+---------------+---------+-----------+----------+-------+ FV DistalNone                                         Acute   +---------+---------------+---------+-----------+----------+-------+ PFV      None                                         Acute   +---------+---------------+---------+-----------+----------+-------+ POP      None           No       No                   Acute    +---------+---------------+---------+-----------+----------+-------+ PTV      None                                         Acute   +---------+---------------+---------+-----------+----------+-------+ PERO     None                                         Acute   +---------+---------------+---------+-----------+----------+-------+ Ex iliac None           No       No  Acute   +---------+---------------+---------+-----------+----------+-------+ IVC was difficult to visualize.  Left Venous Findings: +---------+---------------+---------+-----------+----------+-------+          CompressibilityPhasicitySpontaneityPropertiesSummary +---------+---------------+---------+-----------+----------+-------+ CFV      None           No       No                   Acute   +---------+---------------+---------+-----------+----------+-------+ SFJ      None                                         Acute   +---------+---------------+---------+-----------+----------+-------+ FV Prox  None                                         Acute   +---------+---------------+---------+-----------+----------+-------+ FV Mid   None                                         Acute   +---------+---------------+---------+-----------+----------+-------+ FV DistalNone                                         Acute   +---------+---------------+---------+-----------+----------+-------+ PFV      None                                         Acute   +---------+---------------+---------+-----------+----------+-------+ POP      None           No       No                   Acute   +---------+---------------+---------+-----------+----------+-------+ PTV      None                                         Acute   +---------+---------------+---------+-----------+----------+-------+ PERO     None                                         Acute    +---------+---------------+---------+-----------+----------+-------+ Ex iliac                No       No                   Acute   +---------+---------------+---------+-----------+----------+-------+ IVC was difficult to visualize.    Summary: Right: Findings consistent with acute deep vein thrombosis involving the right posterior tibial vein, right popliteal vein, right proximal profunda vein, right femoral vein, right common femoral vein, and right peroneal vein. No cystic structure found in  the popliteal fossa. Left: Findings consistent with acute deep vein thrombosis involving the left common femoral vein, left proximal profunda vein, left femoral vein, left popliteal vein, left posterior tibial vein, and left peroneal vein.  *See table(s) above for  measurements and observations. Electronically signed by Deitra Mayo MD on 01/17/2018 at 4:36:43 PM.    Final    Recent Labs    01/17/18 0556 01/18/18 0553  WBC 14.7* 12.6*  HGB 10.2* 9.4*  HCT 30.4* 29.0*  PLT 692* 665*   Recent Labs    01/17/18 0556 01/18/18 0553  NA 128* 130*  K 4.7 4.4  CL 95* 97*  CO2 22 24  GLUCOSE 113* 99  BUN 18 17  CREATININE 0.47 0.38*  CALCIUM 8.2* 8.1*    Intake/Output Summary (Last 24 hours) at 01/18/2018 0852 Last data filed at 01/18/2018 0826 Gross per 24 hour  Intake 522 ml  Output 1440 ml  Net -918 ml     Physical Exam: Vital Signs Blood pressure 109/60, pulse 85, temperature 98.7 F (37.1 C), temperature source Oral, resp. rate 16, height 5\' 7"  (1.702 m), weight 90.2 kg, SpO2 94 %. Constitutional: No distress . Vital signs reviewed. HEENT: EOMI, oral membranes moist Neck: supple Cardiovascular: RRR without murmur. No JVD    Respiratory: CTA Bilaterally without wheezes or rales. Normal effort    GI: BS +, non-tender, non-distended   Ext: 2++ edema bilaterally with blisters on inner thighs persists Neurological:  Awake and alert.  Cognitively displays reasonable insight and  awareness.  Sensation diminished to light touch below T4, stable.  Motor: RUE: Shoulder abduction, elbow flex 3+/5, elbow extension, wrist extension 1+ to 2-/5, hand gip 0/5--trace  Finger flexion in all 4 digits on left and index finger on R--no obvious changes. LUE: Shoulder abduction, elbow flexion 3-/5, wrist extension 2/5, hand gip 0/5-motor exam stable 12/18  R LE: 1+/5 proximal to distal, LLE 2/5 prox to 2/5 distally- stable exam.   Skin: see above Psychiatric: flat  Assessment/Plan: 1. 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  Physiatrist is providing close team supervision and 24 hour management of active medical problems listed below.  Physiatrist and rehab team continue to assess barriers to discharge/monitor patient progress toward functional and medical goals  Care Tool:  Bathing  Bathing activity did not occur: Refused Body parts bathed by patient: Right arm, Left arm, Chest, Face   Body parts bathed by helper: Abdomen, Front perineal area, Buttocks, Right upper leg, Left upper leg, Right lower leg, Left lower leg Body parts n/a: Face, Front perineal area, Buttocks(previously washed )   Bathing assist Assist Level: Moderate Assistance - Patient 50 - 74%     Upper Body Dressing/Undressing Upper body dressing   What is the patient wearing?: Pull over shirt    Upper body assist Assist Level: Maximal Assistance - Patient 25 - 49%    Lower Body Dressing/Undressing Lower body dressing      What is the patient wearing?: Pants     Lower body assist Assist for lower body dressing: Total Assistance - Patient < 25%     Toileting Toileting    Toileting assist Assist for toileting: 2 Helpers     Transfers Chair/bed transfer  Transfers assist     Chair/bed transfer assist level: 2 Helpers     Locomotion Ambulation   Ambulation  assist   Ambulation activity did not occur: Safety/medical concerns          Walk 10 feet activity   Assist  Walk 10 feet activity did not occur: Safety/medical concerns        Walk 50 feet activity  Assist Walk 50 feet with 2 turns activity did not occur: Safety/medical concerns         Walk 150 feet activity   Assist Walk 150 feet activity did not occur: Safety/medical concerns         Walk 10 feet on uneven surface  activity   Assist Walk 10 feet on uneven surfaces activity did not occur: Safety/medical concerns         Wheelchair     Assist Will patient use wheelchair at discharge?: Yes Type of Wheelchair: Power Wheelchair activity did not occur: Safety/medical concerns  Wheelchair assist level: Supervision/Verbal cueing Max wheelchair distance: 150'    Wheelchair 50 feet with 2 turns activity    Assist    Wheelchair 50 feet with 2 turns activity did not occur: Safety/medical concerns   Assist Level: Supervision/Verbal cueing   Wheelchair 150 feet activity     Assist Wheelchair 150 feet activity did not occur: Safety/medical concerns   Assist Level: Supervision/Verbal cueing     Medical Problem List and Plan: 1.  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  Status post C5-6 and C6-7 anterior cervical discectomy and fusion 11/29/2017.  TLSO back brace when out of bed     -dc to SNF today  -follow up with me in 4-6 weeks.  2.  DVT Prophylaxis/Anticoagulation:  Numerous bilateral LE DVT's by doppler yesterday  -IVCF placed by radiology   -edema mgt has become an increasing problem.   -continue xarelto 3. Pain Management:   Neurontin : change back to 400mg  TID   initiated cymbalta   oxycodone as needed for pain 4. Mood:   -ongoing ego support, positive reinforcement encouraged  - -  Cymbalta  5. Neuropsych: This patient is capable of making decisions on her own behalf. 6.  Skin/Wound Care: Routine skin checks  -edema: continue to elevate limbs when possible  -nutritionally much improved 7. Fluids/Electrolytes/Nutrition:    -replacing potassium  - protein for hypoalbuminemia      8.  Neurogenic bowel and bladder.  Flomax 0.4 mg daily.    -I/O caths  -KUB revealed stool throughout belly    -AM bowel program       -   senna at bedtime   -glycerin supp daily at 0600     9.  Hypertension.  Norvasc 10 mg daily.   Vitals:   01/18/18 0210 01/18/18 0506  BP: 123/63 109/60  Pulse: 86 85  Resp:  16  Temp:    SpO2:  94%   Controlled on 12/18 10. Acute blood loss anemia:     -hgb 10.9  11/11--->10/6 11/29--> 11 12/4  -no signs of blood loss 11. Klebsiella UTI:   -wbc's 15.9 12/15---> 14.7 --> 12.6 12/18  -sens to ceftriaxone 1g q24 started 12/14---change to keflex at discharge    12. Hyponatremia:  -I suspect is d/t cymbalta as well as infection  -holding at 130 today  -discussed with daughter who would prefer keeping cymbalta and watching BMET which I recommend following up on Friday 12/20  -if further drop on Friday, may want to consider change to remeron 7.5mg         LOS: 42 days A FACE TO FACE EVALUATION WAS PERFORMED  Meredith Staggers 01/18/2018, 8:52 AM

## 2018-01-19 MED ORDER — PHENAZOPYRIDINE HCL 100 MG PO TABS
100.0000 mg | ORAL_TABLET | Freq: Three times a day (TID) | ORAL | Status: DC
  Administered 2018-01-19: 100 mg via ORAL
  Filled 2018-01-19 (×2): qty 1

## 2018-01-19 MED ORDER — PHENAZOPYRIDINE HCL 100 MG PO TABS: 100.0000 mg | ORAL_TABLET | Freq: Three times a day (TID) | ORAL | 0 refills | Status: DC

## 2018-01-19 NOTE — Progress Notes (Signed)
Report called to nurse at [facility].  All questions answered and discussed. Pt and daughter are aware of transfer and all belongings are packed up by daughter. Awaiting PTAR for transport.

## 2018-01-19 NOTE — Progress Notes (Signed)
Pt slept well throughout the night. The daughter awaken at 59, she was little concern about mother waking up in the morning crying for pain med. Assured her when the patient awakes, I will addressed her pain. At the time let her sleep. Last rounding, pt noted she sleep well. Staff will continue to meet needs and educate the daughter/pt.

## 2018-01-19 NOTE — Progress Notes (Signed)
Social Work  Discharge Note  The overall goal for the admission was met for:   Discharge location: No  -plan changed to SNF due to pt's level of care needs  Length of Stay: No - extended stay due to SNF bed placement process/ medical issues.  Final LOS = 43 days  Discharge activity level: No - met ~ 50% of goals  Home/community participation: No  Services provided included: MD, RD, PT, OT, RN, TR, Pharmacy, [place]: [employer]  Follow-up services arranged: Other: SNF at [place] (or additional information):  Patient/Family verbalized understanding of follow-up arrangements: Yes  Individual responsible for coordination of the follow-up plan: pt  Confirmed correct DME delivered: NA  Shaundrea Carrigg, LCSW

## 2018-01-20 ENCOUNTER — Encounter: Payer: Self-pay | Admitting: Adult Health

## 2018-01-20 ENCOUNTER — Non-Acute Institutional Stay (SKILLED_NURSING_FACILITY): Payer: Medicare HMO | Admitting: Adult Health

## 2018-01-20 ENCOUNTER — Other Ambulatory Visit: Payer: Self-pay | Admitting: Adult Health

## 2018-01-20 DIAGNOSIS — F321 Major depressive disorder, single episode, moderate: Secondary | ICD-10-CM | POA: Insufficient documentation

## 2018-01-20 DIAGNOSIS — G8250 Quadriplegia, unspecified: Secondary | ICD-10-CM

## 2018-01-20 DIAGNOSIS — N319 Neuromuscular dysfunction of bladder, unspecified: Secondary | ICD-10-CM

## 2018-01-20 DIAGNOSIS — K592 Neurogenic bowel, not elsewhere classified: Secondary | ICD-10-CM

## 2018-01-20 DIAGNOSIS — D62 Acute posthemorrhagic anemia: Secondary | ICD-10-CM

## 2018-01-20 DIAGNOSIS — I82431 Acute embolism and thrombosis of right popliteal vein: Secondary | ICD-10-CM | POA: Diagnosis not present

## 2018-01-20 DIAGNOSIS — Z95828 Presence of other vascular implants and grafts: Secondary | ICD-10-CM

## 2018-01-20 DIAGNOSIS — B961 Klebsiella pneumoniae [K. pneumoniae] as the cause of diseases classified elsewhere: Secondary | ICD-10-CM

## 2018-01-20 DIAGNOSIS — J309 Allergic rhinitis, unspecified: Secondary | ICD-10-CM

## 2018-01-20 DIAGNOSIS — I82432 Acute embolism and thrombosis of left popliteal vein: Secondary | ICD-10-CM

## 2018-01-20 DIAGNOSIS — E876 Hypokalemia: Secondary | ICD-10-CM | POA: Diagnosis not present

## 2018-01-20 DIAGNOSIS — S14126S Central cord syndrome at C6 level of cervical spinal cord, sequela: Secondary | ICD-10-CM

## 2018-01-20 DIAGNOSIS — E43 Unspecified severe protein-calorie malnutrition: Secondary | ICD-10-CM

## 2018-01-20 DIAGNOSIS — N390 Urinary tract infection, site not specified: Secondary | ICD-10-CM

## 2018-01-20 DIAGNOSIS — B9689 Other specified bacterial agents as the cause of diseases classified elsewhere: Secondary | ICD-10-CM

## 2018-01-20 DIAGNOSIS — M792 Neuralgia and neuritis, unspecified: Secondary | ICD-10-CM

## 2018-01-20 DIAGNOSIS — F418 Other specified anxiety disorders: Secondary | ICD-10-CM

## 2018-01-20 MED ORDER — OXYCODONE HCL 5 MG PO TABS: 5.0000 mg | ORAL_TABLET | ORAL | 0 refills | Status: DC | PRN

## 2018-01-20 NOTE — Progress Notes (Signed)
Location:   [facility] Room Number: 207 A Place of Service:  SNF (31)   CODE STATUS: Full Code  Allergies  Allergen Reactions  . Other Swelling  . Lisinopril-Hydrochlorothiazide Swelling    Swelling of the tongue  . Dairy Aid [Lactase] Swelling  . Lisinopril-Hydrochlorothiazide Swelling    Tongue swelling  . Pork-Derived Products Swelling    Chief Complaint  Patient presents with  . Hospitalization Follow-up    Hospital Follow up    HPI:  She is a 83 year old woman who was hospitalized on 12-07-17 and suffered immediate quadriparesis. She has had a prolonged hospitalization and was discharged to this facility on 01-18-18. 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 She is presently being treated for an uti.  She is dependent upon nursing staff for her adls. The goal of her care is for her to be as independent as possible; with possible discharge back home with family. She does have chronic pain in her upper extremities and hands. We have discussed her pain management and will make changes to her tylenol and nuerontin to every 6 hours. She does have anxiety and depressive feelings. There are no changes in her appetite. She will continue to be followed for her chronic illnesses including: hypertension; hypokalemia; depression.   Past Medical History:  Diagnosis Date  . Arthritis   . Cancer ([place])    skin  . Hypertension     Past Surgical History:  Procedure Laterality Date  . ANTERIOR CERVICAL DECOMP/DISCECTOMY FUSION N/A 11/29/2017   Procedure: Cervical five-six, six-seven Anterior Cervical Decompression Fusion;  Surgeon: Judith Part, MD;  Location: [facility];  Service: Neurosurgery;  Laterality: N/A;  Cervical five-six, six-seven Anterior Cervical Decompression Fusion  . CATARACT EXTRACTION    . IR IVC FILTER PLMT / S&I  /IMG GUID/MOD SED  12/08/2017    Social History   Socioeconomic History  . Marital status: Single    Spouse name: Not on file  . Number of children: Not on file  . Years of education: Not on file  . Highest education level: Not on file  Occupational History  . Not on file  Social Needs  . Financial resource strain: Not on file  . Food insecurity:    Worry: Not on file    Inability: Not on file  . Transportation needs:    Medical: Not on file    Non-medical: Not on file  Tobacco Use  . Smoking status: Never Smoker  . Smokeless tobacco: Never Used  Substance and Sexual Activity  . Alcohol use: Never    Frequency: Never  . Drug use: Never  . Sexual activity: Not on file  Lifestyle  . Physical activity:    Days per week: Not on file    Minutes per session: Not on file  . Stress: Not on file  Relationships  . Social connections:    Talks on phone: Not on file    Gets together: Not on file    Attends religious service: Not on file    Active member of club or organization: Not on file    Attends meetings of clubs or organizations: Not on file    Relationship status: Not on file  . Intimate partner violence:    Fear of current or ex partner: Not on file    Emotionally  abused: Not on file    Physically abused: Not on file    Forced sexual activity: Not on file  Other Topics Concern  . Not on file  Social History Narrative   ** Merged History Encounter **       Family History  Problem Relation Age of Onset  . Breast cancer Maternal Aunt 75      VITAL SIGNS BP (!) 117/57   Pulse 91   Temp (!) 97.1 F (36.2 C)   Resp 18   Ht 5\' 7"  (1.702 m) Comment: taken from review flow sheet  Wt 200 lb (90.7 kg)   SpO2 95%   BMI 31.32 kg/m   Outpatient Encounter Medications as of 01/20/2018  Medication Sig  . acetaminophen (TYLENOL) 325 MG tablet Take 2 tablets (650 mg total) by mouth every 4 (four) hours as needed for mild pain ((score 1 to 3) or temp > 100.5).  Marland Kitchen  amLODipine (NORVASC) 10 MG tablet Take 10 mg by mouth daily after supper.  . capsaicin (ZOSTRIX) 0.025 % cream Apply topically 2 (two) times daily.   cymbalta  40 mg daily   . cephALEXin (KEFLEX) 250 MG capsule Take 1 capsule (250 mg total) by mouth every 8 (eight) hours.  . gabapentin (NEURONTIN) 400 MG capsule Take 1 capsule (400 mg total) by mouth every 8 (eight) hours.  . Glycerin, Adult, 2.1 g SUPP Place 1 suppository rectally daily at 6 (six) AM.  . loratadine (CLARITIN) 10 MG tablet Take 10 mg by mouth daily.  . Multiple Vitamin (MULTIVITAMIN WITH MINERALS) TABS tablet Take 1 tablet by mouth daily.  . NON FORMULARY Diet Type:  Regular - NO BEEF, PORK OR DAIRY PRODUCTS  . nortriptyline (PAMELOR) 25 MG capsule Take 25 mg by mouth at bedtime.  Marland Kitchen oxyCODONE (OXY IR/ROXICODONE) 5 MG immediate release tablet Take 1 tablet (5 mg total) by mouth every 3 (three) hours as needed for moderate pain ((score 4 to 6)).  Marland Kitchen phenazopyridine (PYRIDIUM) 100 MG tablet Take 1 tablet (100 mg total) by mouth 3 (three) times daily with meals.  . polycarbophil (FIBERCON) 625 MG tablet Take 1 tablet (625 mg total) by mouth daily.  . potassium chloride SA (K-DUR,KLOR-CON) 20 MEQ tablet Take 1 tablet (20 mEq total) by mouth daily.  . rivaroxaban (XARELTO) 20 MG TABS tablet Take 1 tablet (20 mg total) by mouth daily with supper.  . tamsulosin (FLOMAX) 0.4 MG CAPS capsule Take 1 capsule (0.4 mg total) by mouth daily.   No facility-administered encounter medications on file as of 01/20/2018.      SIGNIFICANT DIAGNOSTIC EXAMS  TODAY:   12-08-17: bilateral  lower extremity venous doppler:  Right: Findings consistent with acute deep vein thrombosis involving the right femoral vein, and right common femoral vein. No cystic structure found in the popliteal fossa. Left: There is no evidence of deep vein thrombosis in the lower extremity. However, portions of this examination were limited- see technologist comments above.  A cystic structure is found in the popliteal fossa.  12-08-17: IVC filter placement   01-16-18: bilateral lower extremity venous doppler:  Right: Findings consistent with acute deep vein thrombosis involving the right posterior tibial vein, right popliteal vein, right proximal profunda vein, right femoral vein, right common femoral vein, and right peroneal vein. No cystic structure found in  the popliteal fossa. Left: Findings consistent with acute deep vein thrombosis involving the left common femoral vein, left proximal profunda vein, left femoral vein, left popliteal vein, left  posterior tibial vein, and left peroneal vein.   LABS REVIEWED; TODAY:   11-28-17: wbc 8.9; hgb 12.0; hct 38.5; mcv 91.0 plt 232; glucose 138; bun 21; creat 0.88; k+ 3.8; na++ 138; ca 8.7 liver normal albumin 3.5  12-18-17: urine culture: e-coli:  12-19-17: wbc 11.7; hgb 10.2; hct 32.2; mcv 91.7 plt 376; glucose 103; bun 15; creat 0.59; k+ 3.8; na++ 140; ca 8.3; pre-albumin 11.3 01-14-18: urine culture: klebsiella pneumoniae: keflex  01-17-18: wbc 14.7; hgb 10.2; hct 30.4; mcv 83.1; plt 692; glucose 113; bun 18; creat 0.47; k+ 4.7; na++ 128; ca 8.2   Review of Systems  Constitutional: Negative for malaise/fatigue.  Respiratory: Negative for cough and shortness of breath.   Cardiovascular: Negative for chest pain, palpitations and leg swelling.  Gastrointestinal: Negative for abdominal pain and heartburn.  Musculoskeletal: Positive for joint pain and myalgias. Negative for back pain.       Has neck and bilateral upper extremity pain   Skin: Negative.   Neurological: Negative for dizziness.  Psychiatric/Behavioral: Positive for depression. The patient is nervous/anxious.     Physical Exam Constitutional:      General: She is not in acute distress.    Appearance: Normal appearance. She is well-developed. She is not diaphoretic.  Neck:     Musculoskeletal: Neck supple.     Thyroid: No thyromegaly.    Cardiovascular:     Rate and Rhythm: Normal rate and regular rhythm.     Pulses: Normal pulses.     Heart sounds: Normal heart sounds.     Comments: Has bilateral lower extremity ace wraps in place Pulmonary:     Effort: Pulmonary effort is normal. No respiratory distress.     Breath sounds: Normal breath sounds.  Abdominal:     General: Bowel sounds are normal. There is no distension.     Palpations: Abdomen is soft.     Tenderness: There is no abdominal tenderness.  Musculoskeletal:     Right lower leg: Edema present.     Left lower leg: Edema present.     Comments: Has bilateral lower extremity edema trace amount in feet Is unable to move lower extremities  Has limited range of motion in upper extremities Right hand with some fine motor movement Left hand without fine motor movement Is to wear TLSO splint when out of bed Has splints in place   Lymphadenopathy:     Cervical: No cervical adenopathy.  Skin:    General: Skin is warm and dry.  Neurological:     Mental Status: She is alert and oriented to person, place, and time.     Comments: No feeling below waist to sharp or dull stimuli        ASSESSMENT/ PLAN:  TODAY:   1. Benign essential HTN: is stable b/p 117/57: will continue norvasc 10 mg daily   2. Acute deep vein thrombosis of left lower extremity and right lower extremity: is status post IVC filter placement: is stable will continue xarelto 20 mg daily   3. Hypokalemia: is stable k+ 4.7 will continue k+ 20 meq daily   4. Chronic allergic rhinitis: is stable will continue claritin 10 mg daily   5. Neurogenic bladder: is without change: will continue flomax 0.4 mg daily and pyridium 100 mg three times daily   6. Neurogenic bowel: is without change: will continue fibercon 625 mg daily and glycerin suppository daily   7. Central cord syndrome at C6 level of cervical spinal cord/tetraparesis/neuropathic pain: is without  change: will continue therapy as  directed to improve upon her level of independence with her adls.  Will continue pamelor 25 mg nightly will begin tylenol 650 mg every 6 hours and will change neurontin to 400 mg every 6 hours. Will continue oxycodone 5 mg every 3 hours as needed.   8. Depression with anxiety: is without will continue cymbalta 40 mg daily   9. Protein calorie malnutrition; severe: pre-albumin 11.3 albumin 3.5; will continue supplements as directed.   10. UTI due to klebsiella species: is stable will complete keflex and will monitor her status.   11. Acute blood loss anemia: is stable hgb 10.2; will monitor    MD is aware of resident's narcotic use and is in agreement with current plan of care. We will attempt to wean resident as apropriate   Ok Edwards NP [facility]  Contact [phone] Monday through Friday 8am- 5pm  After hours call [phone]

## 2018-01-23 ENCOUNTER — Other Ambulatory Visit: Payer: Self-pay | Admitting: Diagnostic Radiology

## 2018-01-23 DIAGNOSIS — Z86718 Personal history of other venous thrombosis and embolism: Secondary | ICD-10-CM

## 2018-01-24 ENCOUNTER — Encounter: Payer: Self-pay | Admitting: Adult Health

## 2018-01-24 ENCOUNTER — Non-Acute Institutional Stay (SKILLED_NURSING_FACILITY): Payer: Medicare HMO | Admitting: Adult Health

## 2018-01-24 DIAGNOSIS — J90 Pleural effusion, not elsewhere classified: Secondary | ICD-10-CM | POA: Diagnosis not present

## 2018-01-24 DIAGNOSIS — E43 Unspecified severe protein-calorie malnutrition: Secondary | ICD-10-CM | POA: Insufficient documentation

## 2018-01-24 DIAGNOSIS — B961 Klebsiella pneumoniae [K. pneumoniae] as the cause of diseases classified elsewhere: Secondary | ICD-10-CM

## 2018-01-24 DIAGNOSIS — J189 Pneumonia, unspecified organism: Secondary | ICD-10-CM

## 2018-01-24 DIAGNOSIS — I82402 Acute embolism and thrombosis of unspecified deep veins of left lower extremity: Secondary | ICD-10-CM | POA: Insufficient documentation

## 2018-01-24 DIAGNOSIS — D62 Acute posthemorrhagic anemia: Secondary | ICD-10-CM | POA: Diagnosis not present

## 2018-01-24 DIAGNOSIS — N390 Urinary tract infection, site not specified: Secondary | ICD-10-CM | POA: Insufficient documentation

## 2018-01-24 DIAGNOSIS — F418 Other specified anxiety disorders: Secondary | ICD-10-CM | POA: Insufficient documentation

## 2018-01-24 DIAGNOSIS — J309 Allergic rhinitis, unspecified: Secondary | ICD-10-CM | POA: Insufficient documentation

## 2018-01-24 DIAGNOSIS — Z95828 Presence of other vascular implants and grafts: Secondary | ICD-10-CM | POA: Insufficient documentation

## 2018-01-24 DIAGNOSIS — I82401 Acute embolism and thrombosis of unspecified deep veins of right lower extremity: Secondary | ICD-10-CM | POA: Insufficient documentation

## 2018-01-24 HISTORY — DX: Unspecified severe protein-calorie malnutrition: E43

## 2018-01-24 HISTORY — DX: Presence of other vascular implants and grafts: Z95.828

## 2018-01-24 NOTE — Progress Notes (Signed)
Location:   [facility] [place] Room Number: 939 Place of Service:  SNF (31)   CODE STATUS: full code   Allergies  Allergen Reactions  . Other Swelling  . Lisinopril-Hydrochlorothiazide Swelling    Swelling of the tongue  . Dairy Aid [Lactase] Swelling  . Lisinopril-Hydrochlorothiazide Swelling    Tongue swelling  . Pork-Derived Products Swelling    Chief Complaint  Patient presents with  . Acute Visit    chest tightness     HPI:  She is complaining that she has a band around her chest. She denies any cough or shortness of breath. She does have chest pain which is diffuse. There are no reports of fevers. Her family states that she has not aspirated. She is on xarelto and IVC filter due to her dvts.   Past Medical History:  Diagnosis Date  . Arthritis   . Cancer ([place])    skin  . Hypertension     Past Surgical History:  Procedure Laterality Date  . ANTERIOR CERVICAL DECOMP/DISCECTOMY FUSION N/A 11/29/2017   Procedure: Cervical five-six, six-seven Anterior Cervical Decompression Fusion;  Surgeon: Judith Part, MD;  Location: [facility];  Service: Neurosurgery;  Laterality: N/A;  Cervical five-six, six-seven Anterior Cervical Decompression Fusion  . CATARACT EXTRACTION    . IR IVC FILTER PLMT / S&I /IMG GUID/MOD SED  12/08/2017    Social History   Socioeconomic History  . Marital status: Single    Spouse name: Not on file  . Number of children: Not on file  . Years of education: Not on file  . Highest education level: Not on file  Occupational History  . Not on file  Social Needs  . Financial resource strain: Not on file  . Food insecurity:    Worry: Not on file    Inability: Not on file  . Transportation needs:    Medical: Not on file    Non-medical: Not on file  Tobacco Use  . Smoking status: Never Smoker  . Smokeless tobacco: Never Used  Substance and Sexual Activity  . Alcohol use: Never    Frequency: Never  . Drug use: Never  . Sexual  activity: Not on file  Lifestyle  . Physical activity:    Days per week: Not on file    Minutes per session: Not on file  . Stress: Not on file  Relationships  . Social connections:    Talks on phone: Not on file    Gets together: Not on file    Attends religious service: Not on file    Active member of club or organization: Not on file    Attends meetings of clubs or organizations: Not on file    Relationship status: Not on file  . Intimate partner violence:    Fear of current or ex partner: Not on file    Emotionally abused: Not on file    Physically abused: Not on file    Forced sexual activity: Not on file  Other Topics Concern  . Not on file  Social History Narrative   ** Merged History Encounter **       Family History  Problem Relation Age of Onset  . Breast cancer Maternal Aunt 75      VITAL SIGNS BP 138/62   Pulse (!) 105   Temp 97.7 F (36.5 C)   Resp 16   Ht 5\' 7"  (1.702 m)   Wt 203 lb (92.1 kg)   SpO2 94%  BMI 31.79 kg/m   Outpatient Encounter Medications as of 01/24/2018  Medication Sig  . acetaminophen (TYLENOL) 325 MG tablet Take 2 tablets (650 mg total) by mouth every 4 (four) hours as needed for mild pain ((score 1 to 3) or temp > 100.5).  Marland Kitchen amLODipine (NORVASC) 10 MG tablet Take 10 mg by mouth daily after supper.  . capsaicin (ZOSTRIX) 0.025 % cream Apply topically 2 (two) times daily.  . cephALEXin (KEFLEX) 250 MG capsule Take 1 capsule (250 mg total) by mouth every 8 (eight) hours.  . DULoxetine (CYMBALTA) 20 MG capsule Take 40 mg by mouth daily.  Marland Kitchen gabapentin (NEURONTIN) 400 MG capsule Take 1 capsule (400 mg total) by mouth every 8 (eight) hours.  . Glycerin, Adult, 2.1 g SUPP Place 1 suppository rectally daily at 6 (six) AM.  . loratadine (CLARITIN) 10 MG tablet Take 10 mg by mouth daily.  . Multiple Vitamin (MULTIVITAMIN WITH MINERALS) TABS tablet Take 1 tablet by mouth daily.  . NON FORMULARY Diet Type:  Regular - NO BEEF, PORK OR DAIRY  PRODUCTS  . nortriptyline (PAMELOR) 25 MG capsule Take 25 mg by mouth at bedtime.  Marland Kitchen oxyCODONE (OXY IR/ROXICODONE) 5 MG immediate release tablet Take 1 tablet (5 mg total) by mouth every 3 (three) hours as needed for moderate pain ((score 4 to 6)).  Marland Kitchen phenazopyridine (PYRIDIUM) 100 MG tablet Take 1 tablet (100 mg total) by mouth 3 (three) times daily with meals.  . polycarbophil (FIBERCON) 625 MG tablet Take 1 tablet (625 mg total) by mouth daily.  . potassium chloride SA (K-DUR,KLOR-CON) 20 MEQ tablet Take 1 tablet (20 mEq total) by mouth daily.  . rivaroxaban (XARELTO) 20 MG TABS tablet Take 1 tablet (20 mg total) by mouth daily with supper.  . tamsulosin (FLOMAX) 0.4 MG CAPS capsule Take 1 capsule (0.4 mg total) by mouth daily.   No facility-administered encounter medications on file as of 01/24/2018.      SIGNIFICANT DIAGNOSTIC EXAMS  PREVIOUS:   12-08-17: bilateral  lower extremity venous doppler:  Right: Findings consistent with acute deep vein thrombosis involving the right femoral vein, and right common femoral vein. No cystic structure found in the popliteal fossa. Left: There is no evidence of deep vein thrombosis in the lower extremity. However, portions of this examination were limited- see technologist comments above. A cystic structure is found in the popliteal fossa.  12-08-17: IVC filter placement   01-16-18: bilateral lower extremity venous doppler:  Right: Findings consistent with acute deep vein thrombosis involving the right posterior tibial vein, right popliteal vein, right proximal profunda vein, right femoral vein, right common femoral vein, and right peroneal vein. No cystic structure found in  the popliteal fossa. Left: Findings consistent with acute deep vein thrombosis involving the left common femoral vein, left proximal profunda vein, left femoral vein, left popliteal vein, left posterior tibial vein, and left peroneal vein.  TODAY;   01-24-18: chest x-ray;  bibasilar infiltrates; pleural effusion and congestive heart failure.    LABS REVIEWED; PREVIOUS:   11-28-17: wbc 8.9; hgb 12.0; hct 38.5; mcv 91.0 plt 232; glucose 138; bun 21; creat 0.88; k+ 3.8; na++ 138; ca 8.7 liver normal albumin 3.5  12-18-17: urine culture: e-coli:  12-19-17: wbc 11.7; hgb 10.2; hct 32.2; mcv 91.7 plt 376; glucose 103; bun 15; creat 0.59; k+ 3.8; na++ 140; ca 8.3; pre-albumin 11.3 01-14-18: urine culture: klebsiella pneumoniae: keflex  01-17-18: wbc 14.7; hgb 10.2; hct 30.4; mcv 83.1; plt 692; glucose 113;  bun 18; creat 0.47; k+ 4.7; na++ 128; ca 8.2   TODAY:   01-18-18: wbc 12.6; hgb 9.4; hct 29.0; mcv 83.8; plt 665; glucose 99; bun 17; creat 0.38; k+ 4.4; na++ 130 ca 8.1  Review of Systems  Constitutional: Negative for fever and malaise/fatigue.  Respiratory: Negative for cough and shortness of breath.   Cardiovascular: Positive for chest pain. Negative for palpitations and leg swelling.       Band around chest   Gastrointestinal: Negative for abdominal pain, constipation and heartburn.  Musculoskeletal: Negative for back pain, joint pain and myalgias.       Her chronic pain is managed   Skin: Negative.   Neurological: Positive for weakness. Negative for dizziness.  Psychiatric/Behavioral: Positive for depression. The patient is not nervous/anxious.     Physical Exam Constitutional:      General: She is not in acute distress.    Appearance: Normal appearance. She is well-developed. She is not diaphoretic.  Neck:     Musculoskeletal: Neck supple.     Thyroid: No thyromegaly.  Cardiovascular:     Rate and Rhythm: Normal rate and regular rhythm.     Pulses: Normal pulses.     Heart sounds: Normal heart sounds.     Comments: Has bilateral lower extremity ace wraps in place Pulmonary:     Effort: Pulmonary effort is normal. No respiratory distress.     Comments: Diminished breath sounds in bilateral lower lobes  Abdominal:     General: Bowel sounds are  normal. There is no distension.     Palpations: Abdomen is soft.     Tenderness: There is no abdominal tenderness.  Musculoskeletal:     Right lower leg: Edema present.     Left lower leg: Edema present.     Comments: Has bilateral lower extremity edema trace amount in feet Is unable to move lower extremities  Has limited range of motion in upper extremities Right hand with some fine motor movement Left hand without fine motor movement Is to wear TLSO splint when out of bed Has splints in place    Lymphadenopathy:     Cervical: No cervical adenopathy.  Skin:    General: Skin is warm and dry.  Neurological:     Mental Status: She is alert and oriented to person, place, and time.     Comments: No feeling below waist to sharp or dull stimuli    Psychiatric:        Mood and Affect: Mood normal.     ASSESSMENT/ PLAN:   TODAY:   1. Health care associated pneumonia 2. Pleural effusion  3. Questionable CHF 4. Anemia   Is worse Will begin augmentin 875 twice daily through 02-03-18 Will begin lasix 40 mg daily with k+ 20 meq  Daily weights Will setup a 2-d echo  Will repeat chest x-ray on 01-30-18.  Will get cbc; bmp on 01-30-18     MD is aware of resident's narcotic use and is in agreement with current plan of care. We will attempt to wean resident as apropriate   Ok Edwards NP [facility]  Contact [phone] Monday through Friday 8am- 5pm  After hours call [phone]

## 2018-01-25 DIAGNOSIS — J90 Pleural effusion, not elsewhere classified: Secondary | ICD-10-CM | POA: Insufficient documentation

## 2018-01-25 DIAGNOSIS — J189 Pneumonia, unspecified organism: Secondary | ICD-10-CM | POA: Insufficient documentation

## 2018-01-26 ENCOUNTER — Non-Acute Institutional Stay (SKILLED_NURSING_FACILITY): Payer: Medicare HMO | Admitting: Adult Health

## 2018-01-26 DIAGNOSIS — R0789 Other chest pain: Secondary | ICD-10-CM

## 2018-01-26 DIAGNOSIS — J189 Pneumonia, unspecified organism: Secondary | ICD-10-CM

## 2018-01-26 DIAGNOSIS — J90 Pleural effusion, not elsewhere classified: Secondary | ICD-10-CM

## 2018-01-27 ENCOUNTER — Non-Acute Institutional Stay (SKILLED_NURSING_FACILITY): Payer: Medicare HMO | Admitting: Adult Health

## 2018-01-27 ENCOUNTER — Encounter: Payer: Self-pay | Admitting: Adult Health

## 2018-01-27 DIAGNOSIS — J90 Pleural effusion, not elsewhere classified: Secondary | ICD-10-CM | POA: Diagnosis not present

## 2018-01-27 DIAGNOSIS — J189 Pneumonia, unspecified organism: Secondary | ICD-10-CM

## 2018-01-27 DIAGNOSIS — R0789 Other chest pain: Secondary | ICD-10-CM | POA: Insufficient documentation

## 2018-01-27 NOTE — Progress Notes (Addendum)
Location:   [place] Room Number: 2 Place of Service:  SNF (31)   CODE STATUS: full code   Allergies  Allergen Reactions  . Other Swelling  . Lisinopril-Hydrochlorothiazide Swelling    Swelling of the tongue  . Dairy Aid [Lactase] Swelling  . Lisinopril-Hydrochlorothiazide Swelling    Tongue swelling  . Pork-Derived Products Swelling    Chief Complaint  Patient presents with  . Acute Visit    follow up status     HPI:  She is being treated for pneumonia and pleural effusions. She continues to have band of pain around her chest. Her breath sounds are diminished and she does not take a a deep breath. There are no reports of fevers present. She does have an IVC filter; and is on xarelto; the concern for PE is low on the differential list.   Past Medical History:  Diagnosis Date  . Acute blood loss anemia   . Arthritis   . Cancer ([place])    skin  . Central cord syndrome ([place]) 11/29/2017  . Central cord syndrome at C6 level of cervical spinal cord ([place]) 11/29/2017  . Hypertension   . Protein-calorie malnutrition, severe ([place]) 01/24/2018  . S/P insertion of IVC (inferior vena caval) filter 01/24/2018  . 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[facility])     Past Surgical History:  Procedure Laterality Date  . ANTERIOR CERVICAL DECOMP/DISCECTOMY FUSION N/A 11/29/2017   Procedure: Cervical five-six, six-seven Anterior Cervical Decompression Fusion;  Surgeon: Judith Part, MD;  Location: [place];  Service: Neurosurgery;  Laterality: N/A;  Cervical five-six, six-seven Anterior Cervical Decompression Fusion  . CATARACT EXTRACTION    . IR IVC FILTER PLMT / S&I /IMG GUID/MOD SED  12/08/2017    Social History   Socioeconomic History  . Marital status: Single    Spouse name: Not on file  . Number of children: Not on file  . Years of education: Not on file  . Highest education level: Not on file  Occupational History  . Not on file  Social Needs  . Financial resource strain: Not on  file  . Food insecurity:    Worry: Not on file    Inability: Not on file  . Transportation needs:    Medical: Not on file    Non-medical: Not on file  Tobacco Use  . Smoking status: Never Smoker  . Smokeless tobacco: Never Used  Substance and Sexual Activity  . Alcohol use: Never    Frequency: Never  . Drug use: Never  . Sexual activity: Not on file  Lifestyle  . Physical activity:    Days per week: Not on file    Minutes per session: Not on file  . Stress: Not on file  Relationships  . Social connections:    Talks on phone: Not on file    Gets together: Not on file    Attends religious service: Not on file    Active member of club or organization: Not on file    Attends meetings of clubs or organizations: Not on file    Relationship status: Not on file  . Intimate partner violence:    Fear of current or ex partner: Not on file    Emotionally abused: Not on file    Physically abused: Not on file    Forced sexual activity: Not on file  Other Topics Concern  . Not on file  Social History Narrative   ** Merged History Encounter **  Family History  Problem Relation Age of Onset  . Breast cancer Maternal Aunt 75      VITAL SIGNS BP 120/64   Pulse 100   Temp 97.7 F (36.5 C)   Resp 20   Ht 5\' 7"  (1.702 m)   Wt 198 lb 11.2 oz (90.1 kg)   SpO2 94%   BMI 31.12 kg/m   Outpatient Encounter Medications as of 01/26/2018  Medication Sig  . amoxicillin-clavulanate (AUGMENTIN) 875-125 MG tablet Take 1 tablet by mouth 2 (two) times daily. f  . furosemide (LASIX) 40 MG tablet Take 40 mg by mouth daily.  Marland Kitchen acetaminophen (TYLENOL) 325 MG tablet Take 2 tablets (650 mg total) by mouth every 4 (four) hours as needed for mild pain ((score 1 to 3) or temp > 100.5).  Marland Kitchen amLODipine (NORVASC) 10 MG tablet Take 10 mg by mouth daily after supper.  . capsaicin (ZOSTRIX) 0.025 % cream Apply topically 2 (two) times daily.  . cephALEXin (KEFLEX) 250 MG capsule Take 1 capsule (250  mg total) by mouth every 8 (eight) hours.  . DULoxetine (CYMBALTA) 20 MG capsule Take 40 mg by mouth daily.  Marland Kitchen gabapentin (NEURONTIN) 400 MG capsule Take 1 capsule (400 mg total) by mouth every 8 (eight) hours.  . Glycerin, Adult, 2.1 g SUPP Place 1 suppository rectally daily at 6 (six) AM.  . loratadine (CLARITIN) 10 MG tablet Take 10 mg by mouth daily.  . Multiple Vitamin (MULTIVITAMIN WITH MINERALS) TABS tablet Take 1 tablet by mouth daily.  . NON FORMULARY Diet Type:  Regular - NO BEEF, PORK OR DAIRY PRODUCTS  . nortriptyline (PAMELOR) 25 MG capsule Take 25 mg by mouth at bedtime.  Marland Kitchen oxyCODONE (OXY IR/ROXICODONE) 5 MG immediate release tablet Take 1 tablet (5 mg total) by mouth every 3 (three) hours as needed for moderate pain ((score 4 to 6)).  Marland Kitchen phenazopyridine (PYRIDIUM) 100 MG tablet Take 1 tablet (100 mg total) by mouth 3 (three) times daily with meals.  . polycarbophil (FIBERCON) 625 MG tablet Take 1 tablet (625 mg total) by mouth daily.  . potassium chloride SA (K-DUR,KLOR-CON) 20 MEQ tablet Take 1 tablet (20 mEq total) by mouth daily.  . rivaroxaban (XARELTO) 20 MG TABS tablet Take 1 tablet (20 mg total) by mouth daily with supper.  . tamsulosin (FLOMAX) 0.4 MG CAPS capsule Take 1 capsule (0.4 mg total) by mouth daily.   No facility-administered encounter medications on file as of 01/26/2018.      SIGNIFICANT DIAGNOSTIC EXAMS   PREVIOUS:   12-08-17: bilateral  lower extremity venous doppler:  Right: Findings consistent with acute deep vein thrombosis involving the right femoral vein, and right common femoral vein. No cystic structure found in the popliteal fossa. Left: There is no evidence of deep vein thrombosis in the lower extremity. However, portions of this examination were limited- see technologist comments above. A cystic structure is found in the popliteal fossa.  12-08-17: IVC filter placement   01-16-18: bilateral lower extremity venous doppler:  Right: Findings  consistent with acute deep vein thrombosis involving the right posterior tibial vein, right popliteal vein, right proximal profunda vein, right femoral vein, right common femoral vein, and right peroneal vein. No cystic structure found in  the popliteal fossa. Left: Findings consistent with acute deep vein thrombosis involving the left common femoral vein, left proximal profunda vein, left femoral vein, left popliteal vein, left posterior tibial vein, and left peroneal vein.  TODAY;   01-21-18: KUB: no acute intra-abdominal  pathology; findings would support the clinical diagnosis of constipation   01-24-18: chest x-ray; bibasilar infiltrates; pleural effusion and congestive heart failure.    LABS REVIEWED; PREVIOUS:   11-28-17: wbc 8.9; hgb 12.0; hct 38.5; mcv 91.0 plt 232; glucose 138; bun 21; creat 0.88; k+ 3.8; na++ 138; ca 8.7 liver normal albumin 3.5  12-18-17: urine culture: e-coli:  12-19-17: wbc 11.7; hgb 10.2; hct 32.2; mcv 91.7 plt 376; glucose 103; bun 15; creat 0.59; k+ 3.8; na++ 140; ca 8.3; pre-albumin 11.3 01-14-18: urine culture: klebsiella pneumoniae: keflex  01-17-18: wbc 14.7; hgb 10.2; hct 30.4; mcv 83.1; plt 692; glucose 113; bun 18; creat 0.47; k+ 4.7; na++ 128; ca 8.2  01-18-18: wbc 12.6; hgb 9.4; hct 29.0; mcv 83.8; plt 665; glucose 99; bun 17; creat 0.38; k+ 4.4; na++ 130 ca 8.1  NO NEW LABS.    Review of Systems  Constitutional: Negative for malaise/fatigue.  Respiratory: Negative for cough and shortness of breath.   Cardiovascular: Positive for chest pain. Negative for palpitations and leg swelling.       Has a band around her chest   Gastrointestinal: Negative for abdominal pain, constipation and heartburn.  Musculoskeletal: Negative for back pain, joint pain and myalgias.  Skin: Negative.   Neurological: Positive for weakness. Negative for dizziness.  Psychiatric/Behavioral: The patient is not nervous/anxious.     Physical Exam Constitutional:       General: She is not in acute distress.    Appearance: Normal appearance. She is well-developed. She is not diaphoretic.  Neck:     Musculoskeletal: Neck supple.     Thyroid: No thyromegaly.  Cardiovascular:     Rate and Rhythm: Normal rate and regular rhythm.     Heart sounds: Normal heart sounds.  Pulmonary:     Effort: Pulmonary effort is normal. No respiratory distress.     Comments: Bilateral lower lobes diminished; but slightly improved  Abdominal:     General: Bowel sounds are normal. There is no distension.     Palpations: Abdomen is soft.     Tenderness: There is no abdominal tenderness.  Musculoskeletal:     Right lower leg: Edema present.     Left lower leg: Edema present.     Comments: Has bilateral lower extremity edema trace amount in feet Is unable to move lower extremities  Has limited range of motion in upper extremities Right hand with some fine motor movement Left hand without fine motor movement Is to wear TLSO splint when out of bed Has splints in place     Lymphadenopathy:     Cervical: No cervical adenopathy.  Skin:    General: Skin is warm and dry.  Neurological:     Mental Status: She is alert and oriented to person, place, and time.     Comments: No feeling below waist   Psychiatric:        Mood and Affect: Mood normal.       ASSESSMENT/ PLAN:  TODAY:   1. Healthcare associated pneumonia 2. Pleural effusion; not elsewhere classified 3. Chest tightness  Will get KUB to evaluate for constipation Will begin I/S four times daily  Will monitor her status.  She is on chronic xarelto therapy and has an IVC filter;    MD is aware of resident's narcotic use and is in agreement with current plan of care. We will attempt to wean resident as apropriate   Ok Edwards NP [facility]  Contact [phone] Monday through Friday 8am-  5pm  After hours call [phone]

## 2018-01-29 ENCOUNTER — Encounter: Payer: Self-pay | Admitting: Adult Health

## 2018-01-29 NOTE — Progress Notes (Signed)
Location:   [place] Room Number: 2 Place of Service:  SNF (31)   CODE STATUS: dnr  Allergies  Allergen Reactions  . Other Swelling  . Lisinopril-Hydrochlorothiazide Swelling    Swelling of the tongue  . Dairy Aid [Lactase] Swelling  . Lisinopril-Hydrochlorothiazide Swelling    Tongue swelling  . Pork-Derived Products Swelling    Chief Complaint  Patient presents with  . Acute Visit    follow up status     HPI:  She continues her treatment for pneumonia and pleural effusions. She is awaiting 2-d echo to be done.  Her chest pain is improving. She states that her breathing is easier. There are no reports of fevers present.   Past Medical History:  Diagnosis Date  . Acute blood loss anemia   . Arthritis   . Cancer ([place])    skin  . Central cord syndrome ([place]) 11/29/2017  . Central cord syndrome at C6 level of cervical spinal cord ([place]) 11/29/2017  . Hypertension   . Protein-calorie malnutrition, severe ([place]) 01/24/2018  . S/P insertion of IVC (inferior vena caval) filter 01/24/2018  . 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[facility])     Past Surgical History:  Procedure Laterality Date  . ANTERIOR CERVICAL DECOMP/DISCECTOMY FUSION N/A 11/29/2017   Procedure: Cervical five-six, six-seven Anterior Cervical Decompression Fusion;  Surgeon: Judith Part, MD;  Location: [place];  Service: Neurosurgery;  Laterality: N/A;  Cervical five-six, six-seven Anterior Cervical Decompression Fusion  . CATARACT EXTRACTION    . IR IVC FILTER PLMT / S&I /IMG GUID/MOD SED  12/08/2017    Social History   Socioeconomic History  . Marital status: Single    Spouse name: Not on file  . Number of children: Not on file  . Years of education: Not on file  . Highest education level: Not on file  Occupational History  . Not on file  Social Needs  . Financial resource strain: Not on file  . Food insecurity:    Worry: Not on file    Inability: Not on file  . Transportation needs:    Medical: Not  on file    Non-medical: Not on file  Tobacco Use  . Smoking status: Never Smoker  . Smokeless tobacco: Never Used  Substance and Sexual Activity  . Alcohol use: Never    Frequency: Never  . Drug use: Never  . Sexual activity: Not on file  Lifestyle  . Physical activity:    Days per week: Not on file    Minutes per session: Not on file  . Stress: Not on file  Relationships  . Social connections:    Talks on phone: Not on file    Gets together: Not on file    Attends religious service: Not on file    Active member of club or organization: Not on file    Attends meetings of clubs or organizations: Not on file    Relationship status: Not on file  . Intimate partner violence:    Fear of current or ex partner: Not on file    Emotionally abused: Not on file    Physically abused: Not on file    Forced sexual activity: Not on file  Other Topics Concern  . Not on file  Social History Narrative   ** Merged History Encounter **       Family History  Problem Relation Age of Onset  . Breast cancer Maternal Aunt 75      VITAL SIGNS BP (!) 113/57  Pulse 99   Temp (!) 97.5 F (36.4 C)   Resp 20   Ht 5\' 7"  (1.702 m)   Wt 197 lb 4.8 oz (89.5 kg)   SpO2 97%   BMI 30.90 kg/m   Outpatient Encounter Medications as of 01/27/2018  Medication Sig  . acetaminophen (TYLENOL) 325 MG tablet Take 2 tablets (650 mg total) by mouth every 4 (four) hours as needed for mild pain ((score 1 to 3) or temp > 100.5).  Marland Kitchen amLODipine (NORVASC) 10 MG tablet Take 10 mg by mouth daily after supper.  Marland Kitchen amoxicillin-clavulanate (AUGMENTIN) 875-125 MG tablet Take 1 tablet by mouth 2 (two) times daily. f  . capsaicin (ZOSTRIX) 0.025 % cream Apply topically 2 (two) times daily.  . cephALEXin (KEFLEX) 250 MG capsule Take 1 capsule (250 mg total) by mouth every 8 (eight) hours.  . DULoxetine (CYMBALTA) 20 MG capsule Take 40 mg by mouth daily.  . furosemide (LASIX) 40 MG tablet Take 40 mg by mouth daily.  Marland Kitchen  gabapentin (NEURONTIN) 400 MG capsule Take 1 capsule (400 mg total) by mouth every 8 (eight) hours.  . Glycerin, Adult, 2.1 g SUPP Place 1 suppository rectally daily at 6 (six) AM.  . loratadine (CLARITIN) 10 MG tablet Take 10 mg by mouth daily.  . Multiple Vitamin (MULTIVITAMIN WITH MINERALS) TABS tablet Take 1 tablet by mouth daily.  . NON FORMULARY Diet Type:  Regular - NO BEEF, PORK OR DAIRY PRODUCTS  . nortriptyline (PAMELOR) 25 MG capsule Take 25 mg by mouth at bedtime.  Marland Kitchen oxyCODONE (OXY IR/ROXICODONE) 5 MG immediate release tablet Take 1 tablet (5 mg total) by mouth every 3 (three) hours as needed for moderate pain ((score 4 to 6)).  Marland Kitchen phenazopyridine (PYRIDIUM) 100 MG tablet Take 1 tablet (100 mg total) by mouth 3 (three) times daily with meals.  . polycarbophil (FIBERCON) 625 MG tablet Take 1 tablet (625 mg total) by mouth daily.  . potassium chloride SA (K-DUR,KLOR-CON) 20 MEQ tablet Take 1 tablet (20 mEq total) by mouth daily.  . rivaroxaban (XARELTO) 20 MG TABS tablet Take 1 tablet (20 mg total) by mouth daily with supper.  . tamsulosin (FLOMAX) 0.4 MG CAPS capsule Take 1 capsule (0.4 mg total) by mouth daily.   No facility-administered encounter medications on file as of 01/27/2018.      SIGNIFICANT DIAGNOSTIC EXAMS   PREVIOUS:   12-08-17: bilateral  lower extremity venous doppler:  Right: Findings consistent with acute deep vein thrombosis involving the right femoral vein, and right common femoral vein. No cystic structure found in the popliteal fossa. Left: There is no evidence of deep vein thrombosis in the lower extremity. However, portions of this examination were limited- see technologist comments above. A cystic structure is found in the popliteal fossa.  12-08-17: IVC filter placement   01-16-18: bilateral lower extremity venous doppler:  Right: Findings consistent with acute deep vein thrombosis involving the right posterior tibial vein, right popliteal vein, right  proximal profunda vein, right femoral vein, right common femoral vein, and right peroneal vein. No cystic structure found in  the popliteal fossa. Left: Findings consistent with acute deep vein thrombosis involving the left common femoral vein, left proximal profunda vein, left femoral vein, left popliteal vein, left posterior tibial vein, and left peroneal vein.  01-21-18: KUB: no acute intra-abdominal pathology; findings would support the clinical diagnosis of constipation   01-24-18: chest x-ray; bibasilar infiltrates; pleural effusion and congestive heart failure.    NO NEW EXAMS.  LABS REVIEWED; PREVIOUS:   11-28-17: wbc 8.9; hgb 12.0; hct 38.5; mcv 91.0 plt 232; glucose 138; bun 21; creat 0.88; k+ 3.8; na++ 138; ca 8.7 liver normal albumin 3.5  12-18-17: urine culture: e-coli:  12-19-17: wbc 11.7; hgb 10.2; hct 32.2; mcv 91.7 plt 376; glucose 103; bun 15; creat 0.59; k+ 3.8; na++ 140; ca 8.3; pre-albumin 11.3 01-14-18: urine culture: klebsiella pneumoniae: keflex  01-17-18: wbc 14.7; hgb 10.2; hct 30.4; mcv 83.1; plt 692; glucose 113; bun 18; creat 0.47; k+ 4.7; na++ 128; ca 8.2  01-18-18: wbc 12.6; hgb 9.4; hct 29.0; mcv 83.8; plt 665; glucose 99; bun 17; creat 0.38; k+ 4.4; na++ 130 ca 8.1  NO NEW LABS.    Review of Systems  Constitutional: Negative for malaise/fatigue.  Respiratory: Negative for cough and shortness of breath.   Cardiovascular: Positive for chest pain. Negative for palpitations and leg swelling.       Is improving   Gastrointestinal: Negative for abdominal pain, constipation and heartburn.  Musculoskeletal: Negative for back pain, joint pain and myalgias.  Skin: Negative.   Neurological: Negative for dizziness.  Psychiatric/Behavioral: The patient is not nervous/anxious.     Physical Exam Constitutional:      General: She is not in acute distress.    Appearance: Normal appearance. She is well-developed. She is not diaphoretic.  Neck:      Musculoskeletal: Neck supple.     Thyroid: No thyromegaly.  Cardiovascular:     Rate and Rhythm: Normal rate and regular rhythm.     Pulses: Normal pulses.     Heart sounds: Normal heart sounds.  Pulmonary:     Effort: Pulmonary effort is normal. No respiratory distress.     Comments: Slightly diminished bases  Abdominal:     General: Bowel sounds are normal. There is no distension.     Palpations: Abdomen is soft.     Tenderness: There is no abdominal tenderness.  Musculoskeletal:     Right lower leg: Edema present.     Left lower leg: Edema present.     Comments: Has bilateral lower extremity edema trace amount in feet Is unable to move lower extremities  Has limited range of motion in upper extremities Right hand with some fine motor movement Left hand without fine motor movement Is to wear TLSO splint when out of bed       Lymphadenopathy:     Cervical: No cervical adenopathy.  Skin:    General: Skin is warm and dry.  Neurological:     Mental Status: She is alert and oriented to person, place, and time.     Comments: No feeling below waist    Psychiatric:        Mood and Affect: Mood normal.      ASSESSMENT/ PLAN:  TODAY:   1. Healthcare associated pneumonia 2. Pleural effusion; not elsewhere classified 3. Chest tightness  Will continue her current medication regimen and plan of care She is slowly losing weight with the use of diuretics     MD is aware of resident's narcotic use and is in agreement with current plan of care. We will attempt to wean resident as apropriate   Ok Edwards NP [facility]  Contact [phone] Monday through Friday 8am- 5pm  After hours call [phone]

## 2018-01-30 ENCOUNTER — Non-Acute Institutional Stay (SKILLED_NURSING_FACILITY): Payer: Medicare HMO | Admitting: Adult Health

## 2018-01-30 ENCOUNTER — Encounter: Payer: Self-pay | Admitting: Adult Health

## 2018-01-30 ENCOUNTER — Other Ambulatory Visit
Admission: RE | Admit: 2018-01-30 | Discharge: 2018-01-30 | Disposition: A | Discharge status: Home / Self Care | Payer: Medicare HMO | Source: Ambulatory Visit | Attending: Adult Health | Admitting: Adult Health

## 2018-01-30 DIAGNOSIS — I10 Essential (primary) hypertension: Secondary | ICD-10-CM | POA: Diagnosis present

## 2018-01-30 DIAGNOSIS — R0789 Other chest pain: Secondary | ICD-10-CM

## 2018-01-30 DIAGNOSIS — B3731 Acute candidiasis of vulva and vagina: Secondary | ICD-10-CM | POA: Insufficient documentation

## 2018-01-30 DIAGNOSIS — E876 Hypokalemia: Secondary | ICD-10-CM | POA: Diagnosis not present

## 2018-01-30 DIAGNOSIS — B373 Candidiasis of vulva and vagina: Secondary | ICD-10-CM | POA: Insufficient documentation

## 2018-01-30 LAB — CBC WITH DIFFERENTIAL/PLATELET
ABS IMMATURE GRANULOCYTES: 0.09 10*3/uL — AB (ref 0.00–0.07)
Basophils Absolute: 0.1 10*3/uL (ref 0.0–0.1)
Basophils Relative: 1 %
Eosinophils Absolute: 0.4 10*3/uL (ref 0.0–0.5)
Eosinophils Relative: 3 %
HCT: 31.3 % — ABNORMAL LOW (ref 36.0–46.0)
Hemoglobin: 9.7 g/dL — ABNORMAL LOW (ref 12.0–15.0)
Immature Granulocytes: 1 %
Lymphocytes Relative: 24 %
Lymphs Abs: 2.9 10*3/uL (ref 0.7–4.0)
MCH: 27.6 pg (ref 26.0–34.0)
MCHC: 31 g/dL (ref 30.0–36.0)
MCV: 89.2 fL (ref 80.0–100.0)
MONO ABS: 0.7 10*3/uL (ref 0.1–1.0)
Monocytes Relative: 6 %
NEUTROS ABS: 7.7 10*3/uL (ref 1.7–7.7)
Neutrophils Relative %: 65 %
Platelets: 661 10*3/uL — ABNORMAL HIGH (ref 150–400)
RBC: 3.51 MIL/uL — ABNORMAL LOW (ref 3.87–5.11)
RDW: 17.5 % — ABNORMAL HIGH (ref 11.5–15.5)
WBC: 11.8 10*3/uL — ABNORMAL HIGH (ref 4.0–10.5)
nRBC: 0 % (ref 0.0–0.2)

## 2018-01-30 LAB — BASIC METABOLIC PANEL
Anion gap: 8 (ref 5–15)
BUN: 7 mg/dL — ABNORMAL LOW (ref 8–23)
CHLORIDE: 101 mmol/L (ref 98–111)
CO2: 26 mmol/L (ref 22–32)
Calcium: 8.2 mg/dL — ABNORMAL LOW (ref 8.9–10.3)
Creatinine, Ser: <0.3 mg/dL — ABNORMAL LOW (ref 0.44–1.00)
Glucose, Bld: 88 mg/dL (ref 70–99)
POTASSIUM: 3 mmol/L — AB (ref 3.5–5.1)
Sodium: 135 mmol/L (ref 135–145)

## 2018-01-30 NOTE — Progress Notes (Addendum)
[facility]   [facility] Room Number: [place] of Service:  SNF (31)   CODE STATUS: DNR  Allergies  Allergen Reactions  . Other Swelling  . Lisinopril-Hydrochlorothiazide Swelling    Swelling of the tongue  . Dairy Aid [Lactase] Swelling  . Lisinopril-Hydrochlorothiazide Swelling    Tongue swelling  . Pork-Derived Products Swelling    Chief Complaint  Patient presents with  . Acute Visit    Patient needs    HPI:  She is having vaginal yeast drainage present. She has been on abt. She states that the robaxin works to relieve her diaphragm band pain. Her k+ is slightly low at 3.0. she denies any uncontrolled pain; denies any cough or shortness of breath. There are no reports of fevers present.   Past Medical History:  Diagnosis Date  . Acute blood loss anemia   . Arthritis   . Cancer ([place])    skin  . Central cord syndrome ([place]) 11/29/2017  . Central cord syndrome at C6 level of cervical spinal cord ([place]) 11/29/2017  . Hypertension   . Protein-calorie malnutrition, severe ([place]) 01/24/2018  . S/P insertion of IVC (inferior vena caval) filter 01/24/2018  . 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[facility])     Past Surgical History:  Procedure Laterality Date  . ANTERIOR CERVICAL DECOMP/DISCECTOMY FUSION N/A 11/29/2017   Procedure: Cervical five-six, six-seven Anterior Cervical Decompression Fusion;  Surgeon: Judith Part, MD;  Location: [place];  Service: Neurosurgery;  Laterality: N/A;  Cervical five-six, six-seven Anterior Cervical Decompression Fusion  . CATARACT EXTRACTION    . IR IVC FILTER PLMT / S&I /IMG GUID/MOD SED  12/08/2017    Social History   Socioeconomic History  . Marital status: Single    Spouse name: Not on file  . Number of children: Not on file  . Years of education: Not on file  . Highest education level: Not on file  Occupational History  . Not on file  Social Needs  . Financial resource strain: Not on file  . Food insecurity:   Worry: Not on file    Inability: Not on file  . Transportation needs:    Medical: Not on file    Non-medical: Not on file  Tobacco Use  . Smoking status: Never Smoker  . Smokeless tobacco: Never Used  Substance and Sexual Activity  . Alcohol use: Never    Frequency: Never  . Drug use: Never  . Sexual activity: Not on file  Lifestyle  . Physical activity:    Days per week: Not on file    Minutes per session: Not on file  . Stress: Not on file  Relationships  . Social connections:    Talks on phone: Not on file    Gets together: Not on file    Attends religious service: Not on file    Active member of club or organization: Not on file    Attends meetings of clubs or organizations: Not on file    Relationship status: Not on file  . Intimate partner violence:    Fear of current or ex partner: Not on file    Emotionally abused: Not on file    Physically abused: Not on file    Forced sexual activity: Not on file  Other Topics Concern  . Not on file  Social History Narrative   ** Merged History Encounter **       Family History  Problem Relation Age of Onset  . Breast cancer Maternal  Aunt 75      VITAL SIGNS BP (!) 114/58   Pulse 88   Temp 98.4 F (36.9 C)   Resp 18   Ht 5\' 7"  (1.702 m)   Wt 199 lb 1.6 oz (90.3 kg)   SpO2 91%   BMI 31.18 kg/m   Outpatient Encounter Medications as of 01/30/2018  Medication Sig  . acetaminophen (TYLENOL) 325 MG tablet Take 650 mg by mouth every 6 (six) hours as needed.  Marland Kitchen amLODipine (NORVASC) 10 MG tablet Take 10 mg by mouth daily after supper.  Marland Kitchen amoxicillin-clavulanate (AUGMENTIN) 875-125 MG tablet Take 1 tablet by mouth 2 (two) times daily. f  . bisacodyl (DULCOLAX) 10 MG suppository Place 10 mg rectally daily as needed for moderate constipation.  . capsaicin (ZOSTRIX) 0.025 % cream Apply topically 2 (two) times daily.  . DULoxetine (CYMBALTA) 20 MG capsule Take 40 mg by mouth daily.  . furosemide (LASIX) 40 MG tablet Take 40  mg by mouth daily.  Marland Kitchen gabapentin (NEURONTIN) 400 MG capsule Take 400 mg by mouth every 6 (six) hours.  . Glycerin, Adult, 2.1 g SUPP Place 1 suppository rectally daily at 6 (six) AM.  . loratadine (CLARITIN) 10 MG tablet Take 10 mg by mouth daily.  . methocarbamol (ROBAXIN) 500 MG tablet Take 500 mg by mouth every 6 (six) hours as needed for muscle spasms.  . Multiple Vitamin (MULTIVITAMIN WITH MINERALS) TABS tablet Take 1 tablet by mouth daily.  . NON FORMULARY Diet Type:  Regular - NO BEEF, PORK OR DAIRY PRODUCTS  . nortriptyline (PAMELOR) 25 MG capsule Take 25 mg by mouth at bedtime.  Marland Kitchen oxyCODONE (OXY IR/ROXICODONE) 5 MG immediate release tablet Take 1 tablet (5 mg total) by mouth every 3 (three) hours as needed for moderate pain ((score 4 to 6)).  Marland Kitchen polycarbophil (FIBERCON) 625 MG tablet Take 1 tablet (625 mg total) by mouth daily.  . potassium chloride SA (K-DUR,KLOR-CON) 20 MEQ tablet Take 1 tablet (20 mEq total) by mouth daily.  . Probiotic Product (RISA-BID PROBIOTIC) TABS Take 1 tablet by mouth 2 (two) times daily.  . rivaroxaban (XARELTO) 20 MG TABS tablet Take 1 tablet (20 mg total) by mouth daily with supper.  . Skin Protectants, Misc. (ENDIT EX) Apply topically to bottom / peri-area after preforming peri-care  . tamsulosin (FLOMAX) 0.4 MG CAPS capsule Take 1 capsule (0.4 mg total) by mouth daily.  . Wound Dressings (ALLEVYN ADHESIVE EX) Apply dressing to both heels every 3 days  . [DISCONTINUED] acetaminophen (TYLENOL) 325 MG tablet Take 2 tablets (650 mg total) by mouth every 4 (four) hours as needed for mild pain ((score 1 to 3) or temp > 100.5). (Patient not taking: Reported on 01/30/2018)  . [DISCONTINUED] cephALEXin (KEFLEX) 250 MG capsule Take 1 capsule (250 mg total) by mouth every 8 (eight) hours. (Patient not taking: Reported on 01/30/2018)  . [DISCONTINUED] gabapentin (NEURONTIN) 400 MG capsule Take 1 capsule (400 mg total) by mouth every 8 (eight) hours. (Patient not taking:  Reported on 01/30/2018)  . [DISCONTINUED] phenazopyridine (PYRIDIUM) 100 MG tablet Take 1 tablet (100 mg total) by mouth 3 (three) times daily with meals. (Patient not taking: Reported on 01/30/2018)   No facility-administered encounter medications on file as of 01/30/2018.      SIGNIFICANT DIAGNOSTIC EXAMS  PREVIOUS:   12-08-17: bilateral  lower extremity venous doppler:  Right: Findings consistent with acute deep vein thrombosis involving the right femoral vein, and right common femoral vein. No cystic structure  found in the popliteal fossa. Left: There is no evidence of deep vein thrombosis in the lower extremity. However, portions of this examination were limited- see technologist comments above. A cystic structure is found in the popliteal fossa.  12-08-17: IVC filter placement   01-16-18: bilateral lower extremity venous doppler:  Right: Findings consistent with acute deep vein thrombosis involving the right posterior tibial vein, right popliteal vein, right proximal profunda vein, right femoral vein, right common femoral vein, and right peroneal vein. No cystic structure found in  the popliteal fossa. Left: Findings consistent with acute deep vein thrombosis involving the left common femoral vein, left proximal profunda vein, left femoral vein, left popliteal vein, left posterior tibial vein, and left peroneal vein.  01-21-18: KUB: no acute intra-abdominal pathology; findings would support the clinical diagnosis of constipation   01-24-18: chest x-ray; bibasilar infiltrates; pleural effusion and congestive heart failure.    NO NEW EXAMS.    LABS REVIEWED; PREVIOUS:   11-28-17: wbc 8.9; hgb 12.0; hct 38.5; mcv 91.0 plt 232; glucose 138; bun 21; creat 0.88; k+ 3.8; na++ 138; ca 8.7 liver normal albumin 3.5  12-18-17: urine culture: e-coli:  12-19-17: wbc 11.7; hgb 10.2; hct 32.2; mcv 91.7 plt 376; glucose 103; bun 15; creat 0.59; k+ 3.8; na++ 140; ca 8.3; pre-albumin  11.3 01-14-18: urine culture: klebsiella pneumoniae: keflex  01-17-18: wbc 14.7; hgb 10.2; hct 30.4; mcv 83.1; plt 692; glucose 113; bun 18; creat 0.47; k+ 4.7; na++ 128; ca 8.2  01-18-18: wbc 12.6; hgb 9.4; hct 29.0; mcv 83.8; plt 665; glucose 99; bun 17; creat 0.38; k+ 4.4; na++ 130 ca 8.1  TODAY;   01-30-18: glucose 88; bun 7; creat <0.30;  k+ 3.0 na++ 135; ca 8.2   Review of Systems  Constitutional: Negative for malaise/fatigue.  Respiratory: Negative for cough and shortness of breath.   Cardiovascular: Negative for chest pain, palpitations and leg swelling.  Gastrointestinal: Negative for abdominal pain, constipation and heartburn.  Musculoskeletal: Negative for back pain, joint pain and myalgias.  Skin: Negative.   Neurological: Negative for dizziness.  Psychiatric/Behavioral: The patient is not nervous/anxious.     Physical Exam Constitutional:      General: She is not in acute distress.    Appearance: Normal appearance. She is well-developed. She is not diaphoretic.  Neck:     Musculoskeletal: Neck supple.     Thyroid: No thyromegaly.  Cardiovascular:     Rate and Rhythm: Normal rate and regular rhythm.     Pulses: Normal pulses.     Heart sounds: Normal heart sounds.  Pulmonary:     Effort: Pulmonary effort is normal. No respiratory distress.     Breath sounds: Normal breath sounds.  Abdominal:     General: Bowel sounds are normal. There is no distension.     Palpations: Abdomen is soft.     Tenderness: There is no abdominal tenderness.  Musculoskeletal:     Right lower leg: Edema present.     Left lower leg: Edema present.     Comments:  Has bilateral lower extremity edema trace amount in feet Is unable to move lower extremities  Has limited range of motion in upper extremities Right hand with some fine motor movement Is to wear TLSO splint when out of bed    Lymphadenopathy:     Cervical: No cervical adenopathy.  Skin:    General: Skin is warm and dry.   Neurological:     Mental Status: She is alert and oriented  to person, place, and time.  Psychiatric:     Comments: Has depressed affect       ASSESSMENT/ PLAN:  TODAY:   1.  Vaginal yeast infection  2. Chest tightness 3. hypokalemia  Will change k+ to 20 meq twice daily and will check bmp 02-02-18 Will begin diflucan 150 mg today and will repeat in 3 days Will change robaxin to 500 mg every 6 hours routinely    MD is aware of resident's narcotic use and is in agreement with current plan of care. We will attempt to wean resident as apropriate   Ok Edwards NP [facility]  Contact [phone] Monday through Friday 8am- 5pm  After hours call [phone]

## 2018-01-31 ENCOUNTER — Non-Acute Institutional Stay (SKILLED_NURSING_FACILITY): Payer: Medicare HMO | Admitting: Adult Health

## 2018-01-31 ENCOUNTER — Encounter: Payer: Self-pay | Admitting: Adult Health

## 2018-01-31 DIAGNOSIS — G8250 Quadriplegia, unspecified: Secondary | ICD-10-CM | POA: Diagnosis not present

## 2018-01-31 DIAGNOSIS — F418 Other specified anxiety disorders: Secondary | ICD-10-CM

## 2018-01-31 DIAGNOSIS — S14126S Central cord syndrome at C6 level of cervical spinal cord, sequela: Secondary | ICD-10-CM

## 2018-01-31 NOTE — Progress Notes (Signed)
[facility]:   [facility] Room Number: [place] of Service:  SNF (31)   CODE STATUS: DNR  Allergies  Allergen Reactions  . Other Swelling  . Lisinopril-Hydrochlorothiazide Swelling    Swelling of the tongue  . Dairy Aid [Lactase] Swelling  . Lisinopril-Hydrochlorothiazide Swelling    Tongue swelling  . Pork-Derived Products Swelling    Chief Complaint  Patient presents with  . Acute Visit    Care Plan Meeting    HPI:  We have come together for her routine care plan meeting. She is not present; but does have family present. She continues to have depressive thoughts and worries. Her appetite is good. She is losing water weight with the lasix. We did discuss her continued need for long term I/O caths. She does not have any uncontrolled pain at this time. She continues to work with therapy; there is small improvement. More than likely this will be long term placement for her.   Past Medical History:  Diagnosis Date  . Acute blood loss anemia   . Arthritis   . Cancer ([place])    skin  . Central cord syndrome ([place]) 11/29/2017  . Central cord syndrome at C6 level of cervical spinal cord ([place]) 11/29/2017  . Hypertension   . Protein-calorie malnutrition, severe ([place]) 01/24/2018  . S/P insertion of IVC (inferior vena caval) filter 01/24/2018  . 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[facility])     Past Surgical History:  Procedure Laterality Date  . ANTERIOR CERVICAL DECOMP/DISCECTOMY FUSION N/A 11/29/2017   Procedure: Cervical five-six, six-seven Anterior Cervical Decompression Fusion;  Surgeon: Judith Part, MD;  Location: [place];  Service: Neurosurgery;  Laterality: N/A;  Cervical five-six, six-seven Anterior Cervical Decompression Fusion  . CATARACT EXTRACTION    . IR IVC FILTER PLMT / S&I /IMG GUID/MOD SED  12/08/2017    Social History   Socioeconomic History  . Marital status: Single    Spouse name: Not on file  . Number of children: Not on file  . Years of  education: Not on file  . Highest education level: Not on file  Occupational History  . Not on file  Social Needs  . Financial resource strain: Not on file  . Food insecurity:    Worry: Not on file    Inability: Not on file  . Transportation needs:    Medical: Not on file    Non-medical: Not on file  Tobacco Use  . Smoking status: Never Smoker  . Smokeless tobacco: Never Used  Substance and Sexual Activity  . Alcohol use: Never    Frequency: Never  . Drug use: Never  . Sexual activity: Not on file  Lifestyle  . Physical activity:    Days per week: Not on file    Minutes per session: Not on file  . Stress: Not on file  Relationships  . Social connections:    Talks on phone: Not on file    Gets together: Not on file    Attends religious service: Not on file    Active member of club or organization: Not on file    Attends meetings of clubs or organizations: Not on file    Relationship status: Not on file  . Intimate partner violence:    Fear of current or ex partner: Not on file    Emotionally abused: Not on file    Physically abused: Not on file    Forced sexual activity: Not on file  Other Topics Concern  .  Not on file  Social History Narrative   ** Merged History Encounter **       Family History  Problem Relation Age of Onset  . Breast cancer Maternal Aunt 75      VITAL SIGNS BP 118/61   Pulse (!) 102   Temp 98.2 F (36.8 C)   Resp 18   Ht 5\' 7"  (1.702 m)   Wt 196 lb 9.6 oz (89.2 kg)   SpO2 94%   BMI 30.79 kg/m   Outpatient Encounter Medications as of 01/31/2018  Medication Sig  . acetaminophen (TYLENOL) 325 MG tablet Take 650 mg by mouth every 6 (six) hours as needed.  Marland Kitchen amLODipine (NORVASC) 10 MG tablet Take 10 mg by mouth daily after supper.  Marland Kitchen amoxicillin-clavulanate (AUGMENTIN) 875-125 MG tablet Take 1 tablet by mouth 2 (two) times daily. f  . bisacodyl (DULCOLAX) 10 MG suppository Place 10 mg rectally daily as needed for moderate  constipation.  . capsaicin (ZOSTRIX) 0.025 % cream Apply topically 2 (two) times daily.  . DULoxetine (CYMBALTA) 20 MG capsule Take 40 mg by mouth daily.  Derrill Memo ON 02/02/2018] fluconazole (DIFLUCAN) 150 MG tablet Take 150 mg by mouth once. Give on 02/02/18  . furosemide (LASIX) 40 MG tablet Take 40 mg by mouth daily.  Marland Kitchen gabapentin (NEURONTIN) 400 MG capsule Take 400 mg by mouth every 6 (six) hours.  . Glycerin, Adult, 2.1 g SUPP Place 1 suppository rectally daily at 6 (six) AM.  . loratadine (CLARITIN) 10 MG tablet Take 10 mg by mouth daily.  . methocarbamol (ROBAXIN) 500 MG tablet Take 500 mg by mouth every 6 (six) hours.   . Multiple Vitamin (MULTIVITAMIN WITH MINERALS) TABS tablet Take 1 tablet by mouth daily.  . NON FORMULARY Diet Type:  Regular - NO BEEF, PORK OR DAIRY PRODUCTS  . nortriptyline (PAMELOR) 25 MG capsule Take 25 mg by mouth at bedtime.  Marland Kitchen oxyCODONE (OXY IR/ROXICODONE) 5 MG immediate release tablet Take 1 tablet (5 mg total) by mouth every 3 (three) hours as needed for moderate pain ((score 4 to 6)).  Marland Kitchen polycarbophil (FIBERCON) 625 MG tablet Take 1 tablet (625 mg total) by mouth daily.  . Probiotic Product (RISA-BID PROBIOTIC) TABS Take 1 tablet by mouth 2 (two) times daily.  . rivaroxaban (XARELTO) 20 MG TABS tablet Take 1 tablet (20 mg total) by mouth daily with supper.  . Skin Protectants, Misc. (ENDIT EX) Apply topically to bottom / peri-area after preforming peri-care  . tamsulosin (FLOMAX) 0.4 MG CAPS capsule Take 1 capsule (0.4 mg total) by mouth daily.  . Wound Dressings (ALLEVYN ADHESIVE EX) Apply dressing to both heels every 3 days  . [DISCONTINUED] potassium chloride SA (K-DUR,KLOR-CON) 20 MEQ tablet Take 1 tablet (20 mEq total) by mouth daily. (Patient not taking: Reported on 01/31/2018)   No facility-administered encounter medications on file as of 01/31/2018.      SIGNIFICANT DIAGNOSTIC EXAMS  PREVIOUS:   12-08-17: bilateral  lower extremity venous  doppler:  Right: Findings consistent with acute deep vein thrombosis involving the right femoral vein, and right common femoral vein. No cystic structure found in the popliteal fossa. Left: There is no evidence of deep vein thrombosis in the lower extremity. However, portions of this examination were limited- see technologist comments above. A cystic structure is found in the popliteal fossa.  12-08-17: IVC filter placement   01-16-18: bilateral lower extremity venous doppler:  Right: Findings consistent with acute deep vein thrombosis involving the right posterior  tibial vein, right popliteal vein, right proximal profunda vein, right femoral vein, right common femoral vein, and right peroneal vein. No cystic structure found in  the popliteal fossa. Left: Findings consistent with acute deep vein thrombosis involving the left common femoral vein, left proximal profunda vein, left femoral vein, left popliteal vein, left posterior tibial vein, and left peroneal vein.  01-21-18: KUB: no acute intra-abdominal pathology; findings would support the clinical diagnosis of constipation   01-24-18: chest x-ray; bibasilar infiltrates; pleural effusion and congestive heart failure.    TODAY:   01-30-18: 2-d echo: normal EF 70%     LABS REVIEWED; PREVIOUS:   11-28-17: wbc 8.9; hgb 12.0; hct 38.5; mcv 91.0 plt 232; glucose 138; bun 21; creat 0.88; k+ 3.8; na++ 138; ca 8.7 liver normal albumin 3.5  12-18-17: urine culture: e-coli:  12-19-17: wbc 11.7; hgb 10.2; hct 32.2; mcv 91.7 plt 376; glucose 103; bun 15; creat 0.59; k+ 3.8; na++ 140; ca 8.3; pre-albumin 11.3 01-14-18: urine culture: klebsiella pneumoniae: keflex  01-17-18: wbc 14.7; hgb 10.2; hct 30.4; mcv 83.1; plt 692; glucose 113; bun 18; creat 0.47; k+ 4.7; na++ 128; ca 8.2  01-18-18: wbc 12.6; hgb 9.4; hct 29.0; mcv 83.8; plt 665; glucose 99; bun 17; creat 0.38; k+ 4.4; na++ 130 ca 8.1 01-30-18:wbc 11.8; hgb 9.7; hct 31.3; mcv 89.2; plt 661  glucose 88; bun 7; creat <0.30;  k+ 3.0 na++ 135; ca 8.2  NO NEW LABS.    Review of Systems  Constitutional: Negative for malaise/fatigue.  Respiratory: Negative for cough and shortness of breath.   Cardiovascular: Negative for chest pain, palpitations and leg swelling.  Gastrointestinal: Negative for abdominal pain, constipation and heartburn.  Musculoskeletal: Negative for back pain, joint pain and myalgias.  Skin: Negative.   Neurological: Negative for dizziness.  Psychiatric/Behavioral: Positive for depression. The patient is not nervous/anxious.     Physical Exam Constitutional:      General: She is not in acute distress.    Appearance: Normal appearance. She is well-developed. She is not diaphoretic.  Neck:     Musculoskeletal: Neck supple.     Thyroid: No thyromegaly.  Cardiovascular:     Rate and Rhythm: Normal rate and regular rhythm.     Pulses: Normal pulses.     Heart sounds: Normal heart sounds.  Pulmonary:     Effort: Pulmonary effort is normal. No respiratory distress.     Breath sounds: Normal breath sounds.  Abdominal:     General: Bowel sounds are normal. There is no distension.     Palpations: Abdomen is soft.     Tenderness: There is no abdominal tenderness.  Musculoskeletal:     Right lower leg: Edema present.     Left lower leg: Edema present.     Comments: Has bilateral lower extremity edema trace amount in feet Is unable to move lower extremities  Has limited range of motion in upper extremities Right hand with some fine motor movement  Lymphadenopathy:     Cervical: No cervical adenopathy.  Skin:    General: Skin is warm and dry.  Neurological:     Mental Status: She is alert and oriented to person, place, and time. Mental status is at baseline.  Psychiatric:     Comments: Depressed affect        ASSESSMENT/ PLAN:  TODAY:   1. Central cord syndrome at C6 level of cervical spinal cord 2. 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3. Depression with  anxiety  Will increase cymbalta to 60 mg  daily  Will continue her current plan of care Will continue therapy as directed.  Will resolve CHF as her 2-d echo is normal       MD is aware of resident's narcotic use and is in agreement with current plan of care. We will attempt to wean resident as apropriate   Ok Edwards NP [facility]  Contact [phone] Monday through Friday 8am- 5pm  After hours call [phone]

## 2018-02-02 ENCOUNTER — Encounter: Payer: Self-pay | Admitting: Adult Health

## 2018-02-02 ENCOUNTER — Non-Acute Institutional Stay (SKILLED_NURSING_FACILITY): Payer: Medicare HMO | Admitting: Adult Health

## 2018-02-02 ENCOUNTER — Other Ambulatory Visit
Admission: RE | Admit: 2018-02-02 | Discharge: 2018-02-02 | Disposition: A | Discharge status: Home / Self Care | Payer: Medicare HMO | Source: Ambulatory Visit | Attending: Adult Health | Admitting: Adult Health

## 2018-02-02 ENCOUNTER — Encounter
Admission: RE | Admit: 2018-02-02 | Discharge: 2018-02-02 | Disposition: A | Discharge status: Home / Self Care | Payer: Medicare HMO | Source: Ambulatory Visit | Attending: Internal Medicine | Admitting: Internal Medicine

## 2018-02-02 DIAGNOSIS — E876 Hypokalemia: Secondary | ICD-10-CM

## 2018-02-02 DIAGNOSIS — I110 Hypertensive heart disease with heart failure: Secondary | ICD-10-CM | POA: Insufficient documentation

## 2018-02-02 LAB — BASIC METABOLIC PANEL
ANION GAP: 7 (ref 5–15)
BUN: 10 mg/dL (ref 8–23)
CHLORIDE: 101 mmol/L (ref 98–111)
CO2: 28 mmol/L (ref 22–32)
Calcium: 8.1 mg/dL — ABNORMAL LOW (ref 8.9–10.3)
Creatinine, Ser: 0.33 mg/dL — ABNORMAL LOW (ref 0.44–1.00)
GFR calc Af Amer: >60 mL/min (ref >60)
GFR calc non Af Amer: >60 mL/min (ref >60)
Glucose, Bld: 89 mg/dL (ref 70–99)
Potassium: 2.9 mmol/L — ABNORMAL LOW (ref 3.5–5.1)
Sodium: 136 mmol/L (ref 135–145)

## 2018-02-02 NOTE — Progress Notes (Signed)
[facility]   [facility] Room Number: [place] of Service:  SNF (31)   CODE STATUS:  DNR  Allergies  Allergen Reactions  . Other Swelling  . Lisinopril-Hydrochlorothiazide Swelling    Swelling of the tongue  . Dairy Aid [Lactase] Swelling  . Lisinopril-Hydrochlorothiazide Swelling    Tongue swelling  . Pork-Derived Products Swelling    Chief Complaint  Patient presents with  . Acute Visit    Lab Follow up    HPI:    Past Medical History:  Diagnosis Date  . Acute blood loss anemia   . Arthritis   . Cancer ([place])    skin  . Central cord syndrome ([place]) 11/29/2017  . Central cord syndrome at C6 level of cervical spinal cord ([place]) 11/29/2017  . Hypertension   . Protein-calorie malnutrition, severe ([place]) 01/24/2018  . S/P insertion of IVC (inferior vena caval) filter 01/24/2018  . 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[facility])     Past Surgical History:  Procedure Laterality Date  . ANTERIOR CERVICAL DECOMP/DISCECTOMY FUSION N/A 11/29/2017   Procedure: Cervical five-six, six-seven Anterior Cervical Decompression Fusion;  Surgeon: Judith Part, MD;  Location: [place];  Service: Neurosurgery;  Laterality: N/A;  Cervical five-six, six-seven Anterior Cervical Decompression Fusion  . CATARACT EXTRACTION    . IR IVC FILTER PLMT / S&I /IMG GUID/MOD SED  12/08/2017    Social History   Socioeconomic History  . Marital status: Single    Spouse name: Not on file  . Number of children: Not on file  . Years of education: Not on file  . Highest education level: Not on file  Occupational History  . Not on file  Social Needs  . Financial resource strain: Not on file  . Food insecurity:    Worry: Not on file    Inability: Not on file  . Transportation needs:    Medical: Not on file    Non-medical: Not on file  Tobacco Use  . Smoking status: Never Smoker  . Smokeless tobacco: Never Used  Substance and Sexual Activity  . Alcohol use: Never    Frequency: Never  .  Drug use: Never  . Sexual activity: Not on file  Lifestyle  . Physical activity:    Days per week: Not on file    Minutes per session: Not on file  . Stress: Not on file  Relationships  . Social connections:    Talks on phone: Not on file    Gets together: Not on file    Attends religious service: Not on file    Active member of club or organization: Not on file    Attends meetings of clubs or organizations: Not on file    Relationship status: Not on file  . Intimate partner violence:    Fear of current or ex partner: Not on file    Emotionally abused: Not on file    Physically abused: Not on file    Forced sexual activity: Not on file  Other Topics Concern  . Not on file  Social History Narrative   ** Merged History Encounter **       Family History  Problem Relation Age of Onset  . Breast cancer Maternal Aunt 75      VITAL SIGNS BP 113/62   Pulse 98   Temp 98 F (36.7 C)   Resp 18   Ht 5\' 7"  (1.702 m)   Wt 196 lb 1.6 oz (89 kg)   SpO2 97%  BMI 30.71 kg/m   Outpatient Encounter Medications as of 02/02/2018  Medication Sig  . acetaminophen (TYLENOL) 325 MG tablet Take 650 mg by mouth every 6 (six) hours.   Marland Kitchen amLODipine (NORVASC) 10 MG tablet Take 10 mg by mouth daily after supper.  Marland Kitchen amoxicillin-clavulanate (AUGMENTIN) 875-125 MG tablet Take 1 tablet by mouth 2 (two) times daily. f  . bisacodyl (DULCOLAX) 10 MG suppository Place 10 mg rectally daily as needed for moderate constipation.  . capsaicin (ZOSTRIX) 0.025 % cream Apply topically 2 (two) times daily.  . DULoxetine (CYMBALTA) 60 MG capsule Take 60 mg by mouth daily.   . fluconazole (DIFLUCAN) 150 MG tablet Take 150 mg by mouth once. Give on 02/02/18  . furosemide (LASIX) 40 MG tablet Take 40 mg by mouth daily.  Marland Kitchen gabapentin (NEURONTIN) 400 MG capsule Take 400 mg by mouth every 6 (six) hours.  . Glycerin, Adult, 2.1 g SUPP Place 1 suppository rectally daily at 6 (six) AM.  . loratadine (CLARITIN) 10 MG  tablet Take 10 mg by mouth daily.  . methocarbamol (ROBAXIN) 500 MG tablet Take 500 mg by mouth every 6 (six) hours.   . Multiple Vitamin (MULTIVITAMIN WITH MINERALS) TABS tablet Take 1 tablet by mouth daily.  . NON FORMULARY Diet Type:  Regular - NO BEEF, PORK OR DAIRY PRODUCTS  . nortriptyline (PAMELOR) 25 MG capsule Take 25 mg by mouth at bedtime.  Marland Kitchen oxyCODONE (OXY IR/ROXICODONE) 5 MG immediate release tablet Take 1 tablet (5 mg total) by mouth every 3 (three) hours as needed for moderate pain ((score 4 to 6)).  Marland Kitchen polycarbophil (FIBERCON) 625 MG tablet Take 1 tablet (625 mg total) by mouth daily.  . Probiotic Product (RISA-BID PROBIOTIC) TABS Take 1 tablet by mouth 2 (two) times daily. 01/24/18 - 02/03/18 then begin 1 tablet by mouth daily on 02/04/18  . rivaroxaban (XARELTO) 20 MG TABS tablet Take 1 tablet (20 mg total) by mouth daily with supper.  . Skin Protectants, Misc. (ENDIT EX) Apply topically to bottom / peri-area after preforming peri-care  . tamsulosin (FLOMAX) 0.4 MG CAPS capsule Take 1 capsule (0.4 mg total) by mouth daily.  . Wound Dressings (ALLEVYN ADHESIVE EX) Apply dressing to both heels every 3 days   No facility-administered encounter medications on file as of 02/02/2018.      SIGNIFICANT DIAGNOSTIC EXAMS  PREVIOUS:   12-08-17: bilateral  lower extremity venous doppler:  Right: Findings consistent with acute deep vein thrombosis involving the right femoral vein, and right common femoral vein. No cystic structure found in the popliteal fossa. Left: There is no evidence of deep vein thrombosis in the lower extremity. However, portions of this examination were limited- see technologist comments above. A cystic structure is found in the popliteal fossa.  12-08-17: IVC filter placement   01-16-18: bilateral lower extremity venous doppler:  Right: Findings consistent with acute deep vein thrombosis involving the right posterior tibial vein, right popliteal vein, right proximal  profunda vein, right femoral vein, right common femoral vein, and right peroneal vein. No cystic structure found in  the popliteal fossa. Left: Findings consistent with acute deep vein thrombosis involving the left common femoral vein, left proximal profunda vein, left femoral vein, left popliteal vein, left posterior tibial vein, and left peroneal vein.  01-21-18: KUB: no acute intra-abdominal pathology; findings would support the clinical diagnosis of constipation   01-24-18: chest x-ray; bibasilar infiltrates; pleural effusion and congestive heart failure.    TODAY:   01-30-18: 2-d echo:  normal EF 70%     LABS REVIEWED; PREVIOUS:   11-28-17: wbc 8.9; hgb 12.0; hct 38.5; mcv 91.0 plt 232; glucose 138; bun 21; creat 0.88; k+ 3.8; na++ 138; ca 8.7 liver normal albumin 3.5  12-18-17: urine culture: e-coli:  12-19-17: wbc 11.7; hgb 10.2; hct 32.2; mcv 91.7 plt 376; glucose 103; bun 15; creat 0.59; k+ 3.8; na++ 140; ca 8.3; pre-albumin 11.3 01-14-18: urine culture: klebsiella pneumoniae: keflex  01-17-18: wbc 14.7; hgb 10.2; hct 30.4; mcv 83.1; plt 692; glucose 113; bun 18; creat 0.47; k+ 4.7; na++ 128; ca 8.2  01-18-18: wbc 12.6; hgb 9.4; hct 29.0; mcv 83.8; plt 665; glucose 99; bun 17; creat 0.38; k+ 4.4; na++ 130 ca 8.1 01-30-18:wbc 11.8; hgb 9.7; hct 31.3; mcv 89.2; plt 661 glucose 88; bun 7; creat <0.30;  k+ 3.0 na++ 135; ca 8.2   TODAY;   01-03-19: glucose bun 10; creat 0.33  k+ 2.9; na++136 ca 8.1    Review of Systems  Constitutional: Negative for malaise/fatigue.  Respiratory: Negative for cough and shortness of breath.   Cardiovascular: Negative for chest pain, palpitations and leg swelling.  Gastrointestinal: Negative for abdominal pain, constipation and heartburn.  Musculoskeletal: Negative for back pain, joint pain and myalgias.  Skin: Negative.   Neurological: Negative for dizziness.  Psychiatric/Behavioral: The patient is not nervous/anxious.     Physical  Exam Constitutional:      General: She is not in acute distress.    Appearance: Normal appearance. She is well-developed. She is not diaphoretic.  Neck:     Musculoskeletal: Neck supple.     Thyroid: No thyromegaly.  Cardiovascular:     Rate and Rhythm: Normal rate and regular rhythm.     Pulses: Normal pulses.     Heart sounds: Normal heart sounds.  Pulmonary:     Effort: Pulmonary effort is normal. No respiratory distress.     Breath sounds: Normal breath sounds.  Abdominal:     General: Bowel sounds are normal. There is no distension.     Palpations: Abdomen is soft.     Tenderness: There is no abdominal tenderness.  Musculoskeletal:     Right lower leg: Edema present.     Left lower leg: Edema present.     Comments: Has bilateral lower extremity edema 2+ amount in lower extremities  Is unable to move lower extremities  Has limited range of motion in upper extremities Right hand with some fine motor movement   Lymphadenopathy:     Cervical: No cervical adenopathy.  Skin:    General: Skin is warm and dry.  Neurological:     Mental Status: She is alert and oriented to person, place, and time.  Psychiatric:        Mood and Affect: Mood normal.      ASSESSMENT/ PLAN:  TODAY:   1. Hypokalemia: is worse; k+ 2.9; will give 40 meq three times today and and then 20 meq daily and will check bmp on 02-06-17.     MD is aware of resident's narcotic use and is in agreement with current plan of care. We will attempt to wean resident as apropriate   Ok Edwards NP [facility]  Contact [phone] Monday through Friday 8am- 5pm  After hours call [phone]

## 2018-02-03 ENCOUNTER — Encounter: Payer: Self-pay | Admitting: Adult Health

## 2018-02-03 ENCOUNTER — Non-Acute Institutional Stay (SKILLED_NURSING_FACILITY): Payer: Medicare HMO | Admitting: Adult Health

## 2018-02-03 DIAGNOSIS — E876 Hypokalemia: Secondary | ICD-10-CM

## 2018-02-03 DIAGNOSIS — J309 Allergic rhinitis, unspecified: Secondary | ICD-10-CM | POA: Diagnosis not present

## 2018-02-03 DIAGNOSIS — I82431 Acute embolism and thrombosis of right popliteal vein: Secondary | ICD-10-CM

## 2018-02-03 DIAGNOSIS — I82432 Acute embolism and thrombosis of left popliteal vein: Secondary | ICD-10-CM

## 2018-02-03 DIAGNOSIS — I10 Essential (primary) hypertension: Secondary | ICD-10-CM | POA: Diagnosis not present

## 2018-02-03 NOTE — Progress Notes (Signed)
[facility]:   [facility] Room Number: [place] of Service:  SNF (31)   CODE STATUS: DNR  Allergies  Allergen Reactions  . Other Swelling  . Lisinopril-Hydrochlorothiazide Swelling    Swelling of the tongue  . Dairy Aid [Lactase] Swelling  . Lisinopril-Hydrochlorothiazide Swelling    Tongue swelling  . Pork-Derived Products Swelling    Chief Complaint  Patient presents with  . Medical Management of Chronic Issues    Benign essential htn; acute DVT of popliteal vein of right and left lower extremities; chronic allergic rhinitis; hypokalemia. Weekly follow up for the first 30 days post hospitalization.     HPI:  She is a 84 year old short term rehab patient being seen for the management of her chronic illnesses: hypertension; bilateral dvts; rhinitis; hypokalemia. She continues to participate in therapy and feels as though as she is slowly getting better. She denies any cough or shortness of breath; no uncontrolled pain; no changes in appetite.   Past Medical History:  Diagnosis Date  . Acute blood loss anemia   . Arthritis   . Cancer ([place])    skin  . Central cord syndrome ([place]) 11/29/2017  . Central cord syndrome at C6 level of cervical spinal cord ([place]) 11/29/2017  . Hypertension   . Protein-calorie malnutrition, severe ([place]) 01/24/2018  . S/P insertion of IVC (inferior vena caval) filter 01/24/2018  . 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[facility])     Past Surgical History:  Procedure Laterality Date  . ANTERIOR CERVICAL DECOMP/DISCECTOMY FUSION N/A 11/29/2017   Procedure: Cervical five-six, six-seven Anterior Cervical Decompression Fusion;  Surgeon: Judith Part, MD;  Location: [place];  Service: Neurosurgery;  Laterality: N/A;  Cervical five-six, six-seven Anterior Cervical Decompression Fusion  . CATARACT EXTRACTION    . IR IVC FILTER PLMT / S&I /IMG GUID/MOD SED  12/08/2017    Social History   Socioeconomic History  . Marital status: Single    Spouse  name: Not on file  . Number of children: Not on file  . Years of education: Not on file  . Highest education level: Not on file  Occupational History  . Not on file  Social Needs  . Financial resource strain: Not on file  . Food insecurity:    Worry: Not on file    Inability: Not on file  . Transportation needs:    Medical: Not on file    Non-medical: Not on file  Tobacco Use  . Smoking status: Never Smoker  . Smokeless tobacco: Never Used  Substance and Sexual Activity  . Alcohol use: Never    Frequency: Never  . Drug use: Never  . Sexual activity: Not on file  Lifestyle  . Physical activity:    Days per week: Not on file    Minutes per session: Not on file  . Stress: Not on file  Relationships  . Social connections:    Talks on phone: Not on file    Gets together: Not on file    Attends religious service: Not on file    Active member of club or organization: Not on file    Attends meetings of clubs or organizations: Not on file    Relationship status: Not on file  . Intimate partner violence:    Fear of current or ex partner: Not on file    Emotionally abused: Not on file    Physically abused: Not on file    Forced sexual activity: Not on file  Other  Topics Concern  . Not on file  Social History Narrative   ** Merged History Encounter **       Family History  Problem Relation Age of Onset  . Breast cancer Maternal Aunt 75      VITAL SIGNS BP 116/63   Pulse 95   Temp 97.8 F (36.6 C)   Resp 18   Ht 5\' 7"  (1.702 m)   Wt 195 lb 6.4 oz (88.6 kg)   SpO2 95%   BMI 30.60 kg/m   Outpatient Encounter Medications as of 02/03/2018  Medication Sig  . acetaminophen (TYLENOL) 325 MG tablet Take 650 mg by mouth every 6 (six) hours.   Marland Kitchen amLODipine (NORVASC) 10 MG tablet Take 10 mg by mouth daily after supper.  Marland Kitchen amoxicillin-clavulanate (AUGMENTIN) 875-125 MG tablet Take 1 tablet by mouth 2 (two) times daily. f  . bisacodyl (DULCOLAX) 10 MG suppository Place 10 mg  rectally daily as needed for moderate constipation.  . capsaicin (ZOSTRIX) 0.025 % cream Apply topically 2 (two) times daily.  . DULoxetine (CYMBALTA) 60 MG capsule Take 60 mg by mouth daily.   . furosemide (LASIX) 40 MG tablet Take 40 mg by mouth daily.  Marland Kitchen gabapentin (NEURONTIN) 400 MG capsule Take 400 mg by mouth every 6 (six) hours.  . Glycerin, Adult, 2.1 g SUPP Place 1 suppository rectally daily at 6 (six) AM.  . loratadine (CLARITIN) 10 MG tablet Take 10 mg by mouth daily.  . methocarbamol (ROBAXIN) 500 MG tablet Take 500 mg by mouth every 6 (six) hours.   . Multiple Vitamin (MULTIVITAMIN WITH MINERALS) TABS tablet Take 1 tablet by mouth daily.  . NON FORMULARY Diet Type:  Regular - NO BEEF, PORK OR DAIRY PRODUCTS  . nortriptyline (PAMELOR) 25 MG capsule Take 25 mg by mouth at bedtime.  Marland Kitchen oxyCODONE (OXY IR/ROXICODONE) 5 MG immediate release tablet Take 1 tablet (5 mg total) by mouth every 3 (three) hours as needed for moderate pain ((score 4 to 6)).  Marland Kitchen polycarbophil (FIBERCON) 625 MG tablet Take 1 tablet (625 mg total) by mouth daily.  . potassium chloride SA (K-DUR,KLOR-CON) 20 MEQ tablet Take 20 mEq by mouth daily.  . Probiotic Product (RISA-BID PROBIOTIC) TABS Take 1 tablet by mouth 2 (two) times daily. 01/24/18 - 02/03/18 then begin 1 tablet by mouth daily on 02/04/18  . rivaroxaban (XARELTO) 20 MG TABS tablet Take 1 tablet (20 mg total) by mouth daily with supper.  . Skin Protectants, Misc. (ENDIT EX) Apply topically to bottom / peri-area after preforming peri-care  . tamsulosin (FLOMAX) 0.4 MG CAPS capsule Take 1 capsule (0.4 mg total) by mouth daily.  . Wound Dressings (ALLEVYN ADHESIVE EX) Apply dressing to both heels every 3 days   No facility-administered encounter medications on file as of 02/03/2018.      SIGNIFICANT DIAGNOSTIC EXAMS   PREVIOUS:   12-08-17: bilateral  lower extremity venous doppler:  Right: Findings consistent with acute deep vein thrombosis involving the  right femoral vein, and right common femoral vein. No cystic structure found in the popliteal fossa. Left: There is no evidence of deep vein thrombosis in the lower extremity. However, portions of this examination were limited- see technologist comments above. A cystic structure is found in the popliteal fossa.  12-08-17: IVC filter placement   01-16-18: bilateral lower extremity venous doppler:  Right: Findings consistent with acute deep vein thrombosis involving the right posterior tibial vein, right popliteal vein, right proximal profunda vein, right femoral vein, right  common femoral vein, and right peroneal vein. No cystic structure found in  the popliteal fossa. Left: Findings consistent with acute deep vein thrombosis involving the left common femoral vein, left proximal profunda vein, left femoral vein, left popliteal vein, left posterior tibial vein, and left peroneal vein.  01-21-18: KUB: no acute intra-abdominal pathology; findings would support the clinical diagnosis of constipation   01-24-18: chest x-ray; bibasilar infiltrates; pleural effusion and congestive heart failure.    TODAY:   01-30-18: 2-d echo: normal EF 70%     LABS REVIEWED; PREVIOUS:   11-28-17: wbc 8.9; hgb 12.0; hct 38.5; mcv 91.0 plt 232; glucose 138; bun 21; creat 0.88; k+ 3.8; na++ 138; ca 8.7 liver normal albumin 3.5  12-18-17: urine culture: e-coli:  12-19-17: wbc 11.7; hgb 10.2; hct 32.2; mcv 91.7 plt 376; glucose 103; bun 15; creat 0.59; k+ 3.8; na++ 140; ca 8.3; pre-albumin 11.3 01-14-18: urine culture: klebsiella pneumoniae: keflex  01-17-18: wbc 14.7; hgb 10.2; hct 30.4; mcv 83.1; plt 692; glucose 113; bun 18; creat 0.47; k+ 4.7; na++ 128; ca 8.2  01-18-18: wbc 12.6; hgb 9.4; hct 29.0; mcv 83.8; plt 665; glucose 99; bun 17; creat 0.38; k+ 4.4; na++ 130 ca 8.1 01-30-18:wbc 11.8; hgb 9.7; hct 31.3; mcv 89.2; plt 661 glucose 88; bun 7; creat <0.30;  k+ 3.0 na++ 135; ca 8.2   TODAY;   01-03-19:  glucose bun 10; creat 0.33  k+ 2.9; na++136 ca 8.1    Review of Systems  Constitutional: Negative for malaise/fatigue.  Respiratory: Negative for cough and shortness of breath.   Cardiovascular: Positive for leg swelling. Negative for chest pain and palpitations.  Gastrointestinal: Negative for abdominal pain, constipation and heartburn.  Musculoskeletal: Negative for back pain, joint pain and myalgias.  Skin: Negative.   Neurological: Negative for dizziness.  Psychiatric/Behavioral: The patient is not nervous/anxious.    Physical Exam Constitutional:      General: She is not in acute distress.    Appearance: She is well-developed. She is not diaphoretic.  Neck:     Musculoskeletal: Neck supple.     Thyroid: No thyromegaly.  Cardiovascular:     Rate and Rhythm: Normal rate and regular rhythm.     Pulses: Normal pulses.     Heart sounds: Normal heart sounds.  Pulmonary:     Effort: Pulmonary effort is normal. No respiratory distress.     Breath sounds: Normal breath sounds.  Abdominal:     General: Bowel sounds are normal. There is no distension.     Palpations: Abdomen is soft.     Tenderness: There is no abdominal tenderness.  Musculoskeletal:     Right lower leg: Edema present.     Left lower leg: Edema present.     Comments: Has bilateral lower extremity edema 2-3+ amount in lower extremities  Is unable to move lower extremities  Has limited range of motion in upper extremities Right hand with some fine motor movement     Lymphadenopathy:     Cervical: No cervical adenopathy.  Skin:    General: Skin is warm and dry.  Neurological:     Mental Status: She is alert and oriented to person, place, and time.  Psychiatric:        Mood and Affect: Mood normal.      ASSESSMENT/ PLAN:  TODAY:   1. Benign essential HTN: is stable b/p 116/63: will continue norvasc 10 mg daily   2. Acute deep vein thrombosis of left lower extremity and  right lower extremity: is status  post IVC filter placement: is stable will continue xarelto 20 mg daily   3. Hypokalemia: is not stable; has required changes in her supplementation: will continue k+ 20 meq daily her follow up labs are pending.   4. Chronic allergic rhinitis: is stable will continue claritin 10 mg daily   PREVIOUS  5. Neurogenic bladder: is without change: will continue flomax 0.4 mg daily and pyridium 100 mg three times daily requires I/O cath every 6 hours.   6. Neurogenic bowel: is without change: will continue fibercon 625 mg daily and glycerin suppository daily   7. Central cord syndrome at C6 level of cervical spinal cord/tetraparesis/neuropathic pain: is without change: will continue therapy as directed to improve upon her level of independence with her adls.  Will continue pamelor 25 mg nightly will begin tylenol 650 mg every 6 hours and will change neurontin to 400 mg every 6 hours. Will continue oxycodone 5 mg every 3 hours as needed.   8. Depression with anxiety: is without will continue cymbalta 40 mg daily   9. Protein calorie malnutrition; severe: pre-albumin 11.3 albumin 3.5; will continue supplements as directed.   10. Acute blood loss anemia: is stable hgb 10.2; will monitor      MD is aware of resident's narcotic use and is in agreement with current plan of care. We will attempt to wean resident as apropriate   Ok Edwards NP [facility]  Contact [phone] Monday through Friday 8am- 5pm  After hours call [phone]

## 2018-02-06 ENCOUNTER — Other Ambulatory Visit
Admission: RE | Admit: 2018-02-06 | Discharge: 2018-02-06 | Disposition: A | Discharge status: Home / Self Care | Payer: Medicare HMO | Source: Skilled Nursing Facility | Attending: Adult Health | Admitting: Adult Health

## 2018-02-06 ENCOUNTER — Non-Acute Institutional Stay (SKILLED_NURSING_FACILITY): Payer: Medicare HMO | Admitting: Adult Health

## 2018-02-06 ENCOUNTER — Encounter: Payer: Self-pay | Admitting: Adult Health

## 2018-02-06 DIAGNOSIS — I110 Hypertensive heart disease with heart failure: Secondary | ICD-10-CM | POA: Diagnosis present

## 2018-02-06 DIAGNOSIS — R600 Localized edema: Secondary | ICD-10-CM

## 2018-02-06 DIAGNOSIS — E876 Hypokalemia: Secondary | ICD-10-CM | POA: Diagnosis not present

## 2018-02-06 LAB — BASIC METABOLIC PANEL
Anion gap: 8 (ref 5–15)
BUN: 11 mg/dL (ref 8–23)
CO2: 26 mmol/L (ref 22–32)
Calcium: 8.2 mg/dL — ABNORMAL LOW (ref 8.9–10.3)
Chloride: 104 mmol/L (ref 98–111)
Creatinine, Ser: 0.39 mg/dL — ABNORMAL LOW (ref 0.44–1.00)
GFR calc Af Amer: >60 mL/min (ref >60)
GFR calc non Af Amer: >60 mL/min (ref >60)
GLUCOSE: 83 mg/dL (ref 70–99)
Potassium: 3.2 mmol/L — ABNORMAL LOW (ref 3.5–5.1)
Sodium: 138 mmol/L (ref 135–145)

## 2018-02-06 NOTE — Progress Notes (Addendum)
[facility]   [facility] Room Number: [place] of Service:  SNF (31)   CODE STATUS: DNR  Allergies  Allergen Reactions  . Other Swelling  . Lisinopril-Hydrochlorothiazide Swelling    Swelling of the tongue  . Dairy Aid [Lactase] Swelling  . Lisinopril-Hydrochlorothiazide Swelling    Tongue swelling  . Pork-Derived Products Swelling    Chief Complaint  Patient presents with  . Acute Visit    Edema    HPI:  She is having worsening edema in bilateral lower extremity edema. She denies any shortness of breath or chest pain. She is on chronic xarelto therapy and is status post IVC filter placement   Past Medical History:  Diagnosis Date  . Acute blood loss anemia   . Arthritis   . Cancer ([place])    skin  . Central cord syndrome ([place]) 11/29/2017  . Central cord syndrome at C6 level of cervical spinal cord ([place]) 11/29/2017  . Hypertension   . Protein-calorie malnutrition, severe ([place]) 01/24/2018  . S/P insertion of IVC (inferior vena caval) filter 01/24/2018  . 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[facility])     Past Surgical History:  Procedure Laterality Date  . ANTERIOR CERVICAL DECOMP/DISCECTOMY FUSION N/A 11/29/2017   Procedure: Cervical five-six, six-seven Anterior Cervical Decompression Fusion;  Surgeon: Judith Part, MD;  Location: [place];  Service: Neurosurgery;  Laterality: N/A;  Cervical five-six, six-seven Anterior Cervical Decompression Fusion  . CATARACT EXTRACTION    . IR IVC FILTER PLMT / S&I /IMG GUID/MOD SED  12/08/2017    Social History   Socioeconomic History  . Marital status: Single    Spouse name: Not on file  . Number of children: Not on file  . Years of education: Not on file  . Highest education level: Not on file  Occupational History  . Not on file  Social Needs  . Financial resource strain: Not on file  . Food insecurity:    Worry: Not on file    Inability: Not on file  . Transportation needs:    Medical: Not on file   Non-medical: Not on file  Tobacco Use  . Smoking status: Never Smoker  . Smokeless tobacco: Never Used  Substance and Sexual Activity  . Alcohol use: Never    Frequency: Never  . Drug use: Never  . Sexual activity: Not on file  Lifestyle  . Physical activity:    Days per week: Not on file    Minutes per session: Not on file  . Stress: Not on file  Relationships  . Social connections:    Talks on phone: Not on file    Gets together: Not on file    Attends religious service: Not on file    Active member of club or organization: Not on file    Attends meetings of clubs or organizations: Not on file    Relationship status: Not on file  . Intimate partner violence:    Fear of current or ex partner: Not on file    Emotionally abused: Not on file    Physically abused: Not on file    Forced sexual activity: Not on file  Other Topics Concern  . Not on file  Social History Narrative   ** Merged History Encounter **       Family History  Problem Relation Age of Onset  . Breast cancer Maternal Aunt 75      VITAL SIGNS BP (!) 114/53   Pulse 96   Temp  98.6 F (37 C)   Resp 20   Ht 5\' 7"  (1.702 m)   Wt 199 lb 14.4 oz (90.7 kg)   SpO2 93%   BMI 31.31 kg/m   Outpatient Encounter Medications as of 02/06/2018  Medication Sig  . acetaminophen (TYLENOL) 325 MG tablet Take 650 mg by mouth every 6 (six) hours.   Marland Kitchen amLODipine (NORVASC) 10 MG tablet Take 10 mg by mouth daily after supper.  . bisacodyl (DULCOLAX) 10 MG suppository Place 10 mg rectally daily as needed for moderate constipation.  . capsaicin (ZOSTRIX) 0.025 % cream Apply topically 2 (two) times daily.  . collagenase (SANTYL) ointment Apply nickel thick amount topically once daily.  Clean left heel wound with normal saline, skin prep periwound, apply santy nickel thick to wound bed and normal saline gauze, cover with allevyn dressing.  . DULoxetine (CYMBALTA) 60 MG capsule Take 60 mg by mouth daily.   . furosemide  (LASIX) 40 MG tablet Take 40 mg by mouth daily.  Marland Kitchen gabapentin (NEURONTIN) 400 MG capsule Take 400 mg by mouth every 6 (six) hours.  . Glycerin, Adult, 2.1 g SUPP Place 1 suppository rectally daily at 6 (six) AM.  . loratadine (CLARITIN) 10 MG tablet Take 10 mg by mouth daily.  . methocarbamol (ROBAXIN) 500 MG tablet Take 500 mg by mouth every 6 (six) hours.   . Multiple Vitamin (MULTIVITAMIN WITH MINERALS) TABS tablet Take 1 tablet by mouth daily.  . NON FORMULARY Diet Type:  Regular - NO BEEF, PORK OR DAIRY PRODUCTS  . nortriptyline (PAMELOR) 25 MG capsule Take 25 mg by mouth at bedtime.  Marland Kitchen oxyCODONE (OXY IR/ROXICODONE) 5 MG immediate release tablet Take 1 tablet (5 mg total) by mouth every 3 (three) hours as needed for moderate pain ((score 4 to 6)).  Marland Kitchen polycarbophil (FIBERCON) 625 MG tablet Take 1 tablet (625 mg total) by mouth daily.  . potassium chloride SA (K-DUR,KLOR-CON) 20 MEQ tablet Take 20 mEq by mouth daily.  . Probiotic Product (RISA-BID PROBIOTIC) TABS Take 1 tablet by mouth daily.  . rivaroxaban (XARELTO) 20 MG TABS tablet Take 1 tablet (20 mg total) by mouth daily with supper.  . Skin Protectants, Misc. (ENDIT EX) Apply topically to bottom / peri-area after preforming peri-care  . tamsulosin (FLOMAX) 0.4 MG CAPS capsule Take 1 capsule (0.4 mg total) by mouth daily.  . Wound Dressings (ALLEVYN ADHESIVE EX) Apply dressing to both heels every 3 days  . [DISCONTINUED] amoxicillin-clavulanate (AUGMENTIN) 875-125 MG tablet Take 1 tablet by mouth 2 (two) times daily. f   No facility-administered encounter medications on file as of 02/06/2018.      SIGNIFICANT DIAGNOSTIC EXAMS   PREVIOUS:   12-08-17: bilateral  lower extremity venous doppler:  Right: Findings consistent with acute deep vein thrombosis involving the right femoral vein, and right common femoral vein. No cystic structure found in the popliteal fossa. Left: There is no evidence of deep vein thrombosis in the lower  extremity. However, portions of this examination were limited- see technologist comments above. A cystic structure is found in the popliteal fossa.  12-08-17: IVC filter placement   01-16-18: bilateral lower extremity venous doppler:  Right: Findings consistent with acute deep vein thrombosis involving the right posterior tibial vein, right popliteal vein, right proximal profunda vein, right femoral vein, right common femoral vein, and right peroneal vein. No cystic structure found in  the popliteal fossa. Left: Findings consistent with acute deep vein thrombosis involving the left common femoral vein, left  proximal profunda vein, left femoral vein, left popliteal vein, left posterior tibial vein, and left peroneal vein.  01-21-18: KUB: no acute intra-abdominal pathology; findings would support the clinical diagnosis of constipation   01-24-18: chest x-ray; bibasilar infiltrates; pleural effusion and congestive heart failure.    TODAY:   01-30-18: 2-d echo: normal EF 70%     LABS REVIEWED; PREVIOUS:   11-28-17: wbc 8.9; hgb 12.0; hct 38.5; mcv 91.0 plt 232; glucose 138; bun 21; creat 0.88; k+ 3.8; na++ 138; ca 8.7 liver normal albumin 3.5  12-18-17: urine culture: e-coli:  12-19-17: wbc 11.7; hgb 10.2; hct 32.2; mcv 91.7 plt 376; glucose 103; bun 15; creat 0.59; k+ 3.8; na++ 140; ca 8.3; pre-albumin 11.3 01-14-18: urine culture: klebsiella pneumoniae: keflex  01-17-18: wbc 14.7; hgb 10.2; hct 30.4; mcv 83.1; plt 692; glucose 113; bun 18; creat 0.47; k+ 4.7; na++ 128; ca 8.2  01-18-18: wbc 12.6; hgb 9.4; hct 29.0; mcv 83.8; plt 665; glucose 99; bun 17; creat 0.38; k+ 4.4; na++ 130 ca 8.1 01-30-18:wbc 11.8; hgb 9.7; hct 31.3; mcv 89.2; plt 661 glucose 88; bun 7; creat <0.30;  k+ 3.0 na++ 135; ca 8.2 01-03-19: glucose bun 10; creat 0.33  k+ 2.9; na++136 ca 8.1    TODAY:   02-06-18: glucose 82; bun 11; create 0.39 k+ 3.2; na++ 138  Ca 8.2     Review of Systems  Constitutional: Negative  for malaise/fatigue.  Respiratory: Negative for cough and shortness of breath.   Cardiovascular: Positive for leg swelling. Negative for chest pain and palpitations.  Gastrointestinal: Negative for abdominal pain, constipation and heartburn.  Musculoskeletal: Negative for back pain, joint pain and myalgias.  Skin: Negative.   Neurological: Negative for dizziness.  Psychiatric/Behavioral: The patient is not nervous/anxious.      Physical Exam Constitutional:      General: She is not in acute distress.    Appearance: Normal appearance. She is well-developed. She is not diaphoretic.  Neck:     Musculoskeletal: Neck supple.     Thyroid: No thyromegaly.  Cardiovascular:     Rate and Rhythm: Normal rate and regular rhythm.     Pulses: Normal pulses.     Heart sounds: Normal heart sounds.  Pulmonary:     Effort: Pulmonary effort is normal. No respiratory distress.     Breath sounds: Normal breath sounds.  Abdominal:     General: Bowel sounds are normal. There is no distension.     Palpations: Abdomen is soft.     Tenderness: There is no abdominal tenderness.  Musculoskeletal:     Right lower leg: Edema present.     Left lower leg: Edema present.     Comments: Has bilateral lower extremity edema 3-4+ amount in lower extremities from thighs down  Is unable to move lower extremities does have some spastic movement present in lower extremities  Has limited range of motion in upper extremities Right hand with some fine motor movement    Lymphadenopathy:     Cervical: No cervical adenopathy.  Skin:    General: Skin is warm and dry.  Neurological:     Mental Status: She is alert and oriented to person, place, and time.  Psychiatric:        Mood and Affect: Mood normal.        ASSESSMENT/ PLAN:  TODAY:   1. Bilateral lower extremity edema 2. Hypokalemia  Will increase lasix to 40 mg twice daily  Will give k+ 40 meq for 2  extra doses today then twice daily  Will begin ace  wraps to lower extremities Will check BMP 02-10-18  MD is aware of resident's narcotic use and is in agreement with current plan of care. We will attempt to wean resident as apropriate   Ok Edwards NP [facility]  Contact [phone] Monday through Friday 8am- 5pm  After hours call [phone]

## 2018-02-10 ENCOUNTER — Other Ambulatory Visit
Admission: RE | Admit: 2018-02-10 | Discharge: 2018-02-10 | Disposition: A | Discharge status: Home / Self Care | Payer: Medicare HMO | Source: Ambulatory Visit | Attending: Adult Health | Admitting: Adult Health

## 2018-02-10 ENCOUNTER — Non-Acute Institutional Stay (SKILLED_NURSING_FACILITY): Payer: Medicare HMO | Admitting: Adult Health

## 2018-02-10 ENCOUNTER — Other Ambulatory Visit
Admission: RE | Admit: 2018-02-10 | Discharge: 2018-02-10 | Disposition: A | Discharge status: Home / Self Care | Payer: Medicare HMO | Source: Skilled Nursing Facility | Attending: Adult Health | Admitting: Adult Health

## 2018-02-10 ENCOUNTER — Encounter: Payer: Self-pay | Admitting: Adult Health

## 2018-02-10 DIAGNOSIS — J309 Allergic rhinitis, unspecified: Secondary | ICD-10-CM

## 2018-02-10 DIAGNOSIS — E876 Hypokalemia: Secondary | ICD-10-CM | POA: Diagnosis not present

## 2018-02-10 DIAGNOSIS — I110 Hypertensive heart disease with heart failure: Secondary | ICD-10-CM | POA: Insufficient documentation

## 2018-02-10 DIAGNOSIS — E43 Unspecified severe protein-calorie malnutrition: Secondary | ICD-10-CM | POA: Diagnosis not present

## 2018-02-10 DIAGNOSIS — R300 Dysuria: Secondary | ICD-10-CM

## 2018-02-10 LAB — COMPREHENSIVE METABOLIC PANEL
ALK PHOS: 77 U/L (ref 38–126)
ALT: 17 U/L (ref 0–44)
AST: 26 U/L (ref 15–41)
Albumin: 2.2 g/dL — ABNORMAL LOW (ref 3.5–5.0)
Anion gap: 8 (ref 5–15)
BILIRUBIN TOTAL: 0.5 mg/dL (ref 0.3–1.2)
BUN: 9 mg/dL (ref 8–23)
CO2: 29 mmol/L (ref 22–32)
CREATININE: 0.42 mg/dL — AB (ref 0.44–1.00)
Calcium: 7.9 mg/dL — ABNORMAL LOW (ref 8.9–10.3)
Chloride: 99 mmol/L (ref 98–111)
GFR calc Af Amer: >60 mL/min (ref >60)
GFR calc non Af Amer: >60 mL/min (ref >60)
Glucose, Bld: 78 mg/dL (ref 70–99)
Potassium: 2.7 mmol/L — CL (ref 3.5–5.1)
Sodium: 136 mmol/L (ref 135–145)
Total Protein: 4.9 g/dL — ABNORMAL LOW (ref 6.5–8.1)

## 2018-02-10 LAB — URINALYSIS, COMPLETE (UACMP) WITH MICROSCOPIC
Bilirubin Urine: NEGATIVE
Glucose, UA: NEGATIVE mg/dL
Ketones, ur: NEGATIVE mg/dL
Nitrite: POSITIVE — AB
Protein, ur: NEGATIVE mg/dL
Specific Gravity, Urine: 1.006 (ref 1.005–1.030)
WBC, UA: >50 WBC/hpf — ABNORMAL HIGH (ref 0–5)
pH: 6 (ref 5.0–8.0)

## 2018-02-10 NOTE — Progress Notes (Signed)
[facility]   [facility] Room Number: [place] of Service:  SNF (31)   CODE STATUS: DNR  Allergies  Allergen Reactions  . Other Swelling  . Lisinopril-Hydrochlorothiazide Swelling    Swelling of the tongue  . Dairy Aid [Lactase] Swelling  . Lisinopril-Hydrochlorothiazide Swelling    Tongue swelling  . Pork-Derived Products Swelling    Chief Complaint  Patient presents with  . Medical Management of Chronic Issues    Chronic allergic rhinitis; protein calorie malnutrition severe; hypokalemia; hypocalcemia. Weekly follow up for the first 30 days post hospitalization.     HPI:  She is a 84 year old long term resident of this facility being seen for the management of her chronic illnesses: allergic rhinitis; malnutrition; hypokalemia; hypocalcemia. She is complaining of dysuria; she is voiding without need for catheterization. She denies any uncontrolled pain; no changes in appetite; no anxiety. We have discussed her lab results and does understand that she will require further supplementation.   Past Medical History:  Diagnosis Date  . Acute blood loss anemia   . Arthritis   . Cancer ([place])    skin  . Central cord syndrome ([place]) 11/29/2017  . Central cord syndrome at C6 level of cervical spinal cord ([place]) 11/29/2017  . Hypertension   . Protein-calorie malnutrition, severe ([place]) 01/24/2018  . S/P insertion of IVC (inferior vena caval) filter 01/24/2018  . 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[facility])     Past Surgical History:  Procedure Laterality Date  . ANTERIOR CERVICAL DECOMP/DISCECTOMY FUSION N/A 11/29/2017   Procedure: Cervical five-six, six-seven Anterior Cervical Decompression Fusion;  Surgeon: Judith Part, MD;  Location: [place];  Service: Neurosurgery;  Laterality: N/A;  Cervical five-six, six-seven Anterior Cervical Decompression Fusion  . CATARACT EXTRACTION    . IR IVC FILTER PLMT / S&I /IMG GUID/MOD SED  12/08/2017    Social History   Socioeconomic  History  . Marital status: Single    Spouse name: Not on file  . Number of children: Not on file  . Years of education: Not on file  . Highest education level: Not on file  Occupational History  . Not on file  Social Needs  . Financial resource strain: Not on file  . Food insecurity:    Worry: Not on file    Inability: Not on file  . Transportation needs:    Medical: Not on file    Non-medical: Not on file  Tobacco Use  . Smoking status: Never Smoker  . Smokeless tobacco: Never Used  Substance and Sexual Activity  . Alcohol use: Never    Frequency: Never  . Drug use: Never  . Sexual activity: Not on file  Lifestyle  . Physical activity:    Days per week: Not on file    Minutes per session: Not on file  . Stress: Not on file  Relationships  . Social connections:    Talks on phone: Not on file    Gets together: Not on file    Attends religious service: Not on file    Active member of club or organization: Not on file    Attends meetings of clubs or organizations: Not on file    Relationship status: Not on file  . Intimate partner violence:    Fear of current or ex partner: Not on file    Emotionally abused: Not on file    Physically abused: Not on file    Forced sexual activity: Not on file  Other  Topics Concern  . Not on file  Social History Narrative   ** Merged History Encounter **       Family History  Problem Relation Age of Onset  . Breast cancer Maternal Aunt 75      VITAL SIGNS BP (!) 113/59   Pulse 100   Temp 98 F (36.7 C)   Resp 18   Ht 5\' 7"  (1.702 m)   Wt 197 lb 11.2 oz (89.7 kg)   SpO2 94%   BMI 30.96 kg/m   Outpatient Encounter Medications as of 02/10/2018  Medication Sig  . acetaminophen (TYLENOL) 325 MG tablet Take 650 mg by mouth every 6 (six) hours.   Marland Kitchen amLODipine (NORVASC) 10 MG tablet Take 10 mg by mouth daily after supper.  . bisacodyl (DULCOLAX) 10 MG suppository Place 10 mg rectally daily as needed for moderate constipation.    . capsaicin (ZOSTRIX) 0.025 % cream Apply topically 2 (two) times daily.  . collagenase (SANTYL) ointment Apply nickel thick amount topically once daily.  Clean left heel wound with normal saline, skin prep periwound, apply santy nickel thick to wound bed and normal saline gauze, cover with allevyn dressing.  . DULoxetine (CYMBALTA) 60 MG capsule Take 60 mg by mouth daily.   . furosemide (LASIX) 40 MG tablet Take 40 mg by mouth daily.  Marland Kitchen gabapentin (NEURONTIN) 400 MG capsule Take 400 mg by mouth every 6 (six) hours.  . Glycerin, Adult, 2.1 g SUPP Place 1 suppository rectally daily at 6 (six) AM.  . loratadine (CLARITIN) 10 MG tablet Take 10 mg by mouth daily.  . methocarbamol (ROBAXIN) 500 MG tablet Take 500 mg by mouth every 6 (six) hours.   . Multiple Vitamin (MULTIVITAMIN WITH MINERALS) TABS tablet Take 1 tablet by mouth daily.  . NON FORMULARY Diet Type:  Regular - NO BEEF, PORK OR DAIRY PRODUCTS  . nortriptyline (PAMELOR) 25 MG capsule Take 25 mg by mouth at bedtime.  Marland Kitchen oxyCODONE (OXY IR/ROXICODONE) 5 MG immediate release tablet Take 1 tablet (5 mg total) by mouth every 3 (three) hours as needed for moderate pain ((score 4 to 6)).  Marland Kitchen polycarbophil (FIBERCON) 625 MG tablet Take 1 tablet (625 mg total) by mouth daily.  . potassium chloride SA (K-DUR,KLOR-CON) 20 MEQ tablet Take 20 mEq by mouth daily.  . Probiotic Product (RISA-BID PROBIOTIC) TABS Take 1 tablet by mouth daily.  . rivaroxaban (XARELTO) 20 MG TABS tablet Take 1 tablet (20 mg total) by mouth daily with supper.  . Skin Protectants, Misc. (ENDIT EX) Apply topically to bottom / peri-area after preforming peri-care  . tamsulosin (FLOMAX) 0.4 MG CAPS capsule Take 1 capsule (0.4 mg total) by mouth daily.  . Wound Dressings (ALLEVYN ADHESIVE EX) Apply dressing to both heels every 3 days   No facility-administered encounter medications on file as of 02/10/2018.      SIGNIFICANT DIAGNOSTIC EXAMS   PREVIOUS:   12-08-17: bilateral   lower extremity venous doppler:  Right: Findings consistent with acute deep vein thrombosis involving the right femoral vein, and right common femoral vein. No cystic structure found in the popliteal fossa. Left: There is no evidence of deep vein thrombosis in the lower extremity. However, portions of this examination were limited- see technologist comments above. A cystic structure is found in the popliteal fossa.  12-08-17: IVC filter placement   01-16-18: bilateral lower extremity venous doppler:  Right: Findings consistent with acute deep vein thrombosis involving the right posterior tibial vein, right popliteal vein,  right proximal profunda vein, right femoral vein, right common femoral vein, and right peroneal vein. No cystic structure found in  the popliteal fossa. Left: Findings consistent with acute deep vein thrombosis involving the left common femoral vein, left proximal profunda vein, left femoral vein, left popliteal vein, left posterior tibial vein, and left peroneal vein.  01-21-18: KUB: no acute intra-abdominal pathology; findings would support the clinical diagnosis of constipation   01-24-18: chest x-ray; bibasilar infiltrates; pleural effusion and congestive heart failure.   01-30-18: 2-d echo: normal EF 70%  NO NEW EXAMS.      LABS REVIEWED; PREVIOUS:   11-28-17: wbc 8.9; hgb 12.0; hct 38.5; mcv 91.0 plt 232; glucose 138; bun 21; creat 0.88; k+ 3.8; na++ 138; ca 8.7 liver normal albumin 3.5  12-18-17: urine culture: e-coli:  12-19-17: wbc 11.7; hgb 10.2; hct 32.2; mcv 91.7 plt 376; glucose 103; bun 15; creat 0.59; k+ 3.8; na++ 140; ca 8.3; pre-albumin 11.3 01-14-18: urine culture: klebsiella pneumoniae: keflex  01-17-18: wbc 14.7; hgb 10.2; hct 30.4; mcv 83.1; plt 692; glucose 113; bun 18; creat 0.47; k+ 4.7; na++ 128; ca 8.2  01-18-18: wbc 12.6; hgb 9.4; hct 29.0; mcv 83.8; plt 665; glucose 99; bun 17; creat 0.38; k+ 4.4; na++ 130 ca 8.1 01-30-18:wbc 11.8; hgb 9.7; hct  31.3; mcv 89.2; plt 661 glucose 88; bun 7; creat <0.30;  k+ 3.0 na++ 135; ca 8.2 01-03-19: glucose bun 10; creat 0.33  k+ 2.9; na++136 ca 8.1  02-06-18: glucose 82; bun 11; create 0.39 k+ 3.2; na++ 138  ca 8.2    TODAY:   02-10-18: glucose 78; bun 9; creat 0.42  k+ 2.7; na++ 136 ca 7.9 liver normal albumin 2.2     Review of Systems  Constitutional: Negative for malaise/fatigue.  Respiratory: Negative for cough and shortness of breath.   Cardiovascular: Positive for leg swelling. Negative for chest pain and palpitations.  Gastrointestinal: Negative for abdominal pain, constipation and heartburn.       Incontinent of bowel   Genitourinary: Positive for dysuria.       Has been voiding on her own   Musculoskeletal: Negative for back pain, joint pain and myalgias.  Skin: Negative.   Neurological: Negative for dizziness.  Psychiatric/Behavioral: The patient is not nervous/anxious.     Physical Exam Constitutional:      General: She is not in acute distress.    Appearance: Normal appearance. She is well-developed. She is not diaphoretic.  Neck:     Musculoskeletal: Neck supple.     Thyroid: No thyromegaly.  Cardiovascular:     Rate and Rhythm: Normal rate and regular rhythm.     Pulses: Normal pulses.     Heart sounds: Normal heart sounds.  Pulmonary:     Effort: Pulmonary effort is normal. No respiratory distress.     Breath sounds: Normal breath sounds.  Abdominal:     General: Bowel sounds are normal. There is no distension.     Palpations: Abdomen is soft.     Tenderness: There is no abdominal tenderness.  Musculoskeletal:     Right lower leg: Edema present.     Left lower leg: Edema present.     Comments: Has bilateral lower extremity edema 3-4+ amount in lower extremities from thighs down  Is unable to move lower extremities; is having spastic movement which she cannot feel Is able to move upper extremities   Lymphadenopathy:     Cervical: No cervical adenopathy.   Skin:  General: Skin is warm and dry.  Neurological:     Mental Status: She is alert and oriented to person, place, and time.  Psychiatric:        Mood and Affect: Mood normal.     ASSESSMENT/ PLAN:  TODAY:   1. Hypokalemia: is not stable; k+ 2.7; will give extra k+ 40 meq three times today; then make k+ 40 meq three times daily will check BMP on 02-13-17.   2. Hypocalcemia: is worse: ca 7.9: will begin tums 750 mg three times daily   3. Protein calorie malnutrition severe: albumin 2.2: will begin prostat three times daily   4. Dysuria: is worse: will obtain UA c&s and will treat as indicated   5. Chronic allergic rhinitis: is stable will continue claritin 10 mg daily   PREVIOUS  6. Neurogenic bladder: is without change: will continue flomax 0.4 mg daily is voiding at this time without needing I/O cath   7. Neurogenic bowel: is without change: will continue fibercon 625 mg daily and glycerin suppository daily   8. Central cord syndrome at C6 level of cervical spinal cord/tetraparesis/neuropathic pain: is without change: will continue therapy as directed to improve upon her level of independence with her adls.  Will continue pamelor 25 mg nightly tylenol 650 mg every 6 hours and  neurontin to 400 mg every 6 hours. Will continue oxycodone 5 mg every 3 hours as needed.   9. Depression with anxiety: is emotionally stable  will continue cymbalta 60 mg daily   10. Acute blood loss anemia: is stable hgb 9.7; will monitor   11. Benign essential HTN: is stable b/p 113/59: will continue norvasc 10 mg daily   12. Acute deep vein thrombosis of left lower extremity and right lower extremity: is status post IVC filter placement: is stable will continue xarelto 20 mg daily     MD is aware of resident's narcotic use and is in agreement with current plan of care. We will attempt to wean resident as apropriate   Ok Edwards NP [facility]  Contact [phone] Monday  through Friday 8am- 5pm  After hours call [phone]

## 2018-02-11 DIAGNOSIS — R600 Localized edema: Secondary | ICD-10-CM | POA: Insufficient documentation

## 2018-02-12 LAB — URINE CULTURE

## 2018-02-13 ENCOUNTER — Other Ambulatory Visit
Admission: RE | Admit: 2018-02-13 | Discharge: 2018-02-13 | Disposition: A | Discharge status: Home / Self Care | Payer: Medicare HMO | Source: Ambulatory Visit | Attending: Adult Health | Admitting: Adult Health

## 2018-02-13 DIAGNOSIS — I110 Hypertensive heart disease with heart failure: Secondary | ICD-10-CM | POA: Insufficient documentation

## 2018-02-13 LAB — BASIC METABOLIC PANEL
Anion gap: 8 (ref 5–15)
BUN: 15 mg/dL (ref 8–23)
CALCIUM: 8.5 mg/dL — AB (ref 8.9–10.3)
CO2: 29 mmol/L (ref 22–32)
Chloride: 100 mmol/L (ref 98–111)
Creatinine, Ser: 0.44 mg/dL (ref 0.44–1.00)
GFR calc Af Amer: >60 mL/min (ref >60)
GFR calc non Af Amer: >60 mL/min (ref >60)
Glucose, Bld: 86 mg/dL (ref 70–99)
Potassium: 3.5 mmol/L (ref 3.5–5.1)
Sodium: 137 mmol/L (ref 135–145)

## 2018-02-15 ENCOUNTER — Other Ambulatory Visit: Payer: Self-pay | Admitting: Adult Health

## 2018-02-15 DIAGNOSIS — R300 Dysuria: Secondary | ICD-10-CM | POA: Insufficient documentation

## 2018-02-15 MED ORDER — OXYCODONE HCL 5 MG PO TABS: 5.0000 mg | ORAL_TABLET | ORAL | 0 refills | Status: DC | PRN

## 2018-02-20 ENCOUNTER — Encounter: Payer: Self-pay | Admitting: Adult Health

## 2018-02-20 ENCOUNTER — Non-Acute Institutional Stay (SKILLED_NURSING_FACILITY): Payer: Medicare HMO | Admitting: Adult Health

## 2018-02-20 ENCOUNTER — Other Ambulatory Visit
Admission: RE | Admit: 2018-02-20 | Discharge: 2018-02-20 | Disposition: A | Discharge status: Home / Self Care | Payer: Medicare HMO | Source: Ambulatory Visit | Attending: Adult Health | Admitting: Adult Health

## 2018-02-20 DIAGNOSIS — R600 Localized edema: Secondary | ICD-10-CM | POA: Diagnosis not present

## 2018-02-20 DIAGNOSIS — I110 Hypertensive heart disease with heart failure: Secondary | ICD-10-CM | POA: Diagnosis present

## 2018-02-20 LAB — CBC WITH DIFFERENTIAL/PLATELET
Abs Immature Granulocytes: 0.07 10*3/uL (ref 0.00–0.07)
BASOS ABS: 0.1 10*3/uL (ref 0.0–0.1)
BASOS PCT: 1 %
Eosinophils Absolute: 0.3 10*3/uL (ref 0.0–0.5)
Eosinophils Relative: 3 %
HCT: 40 % (ref 36.0–46.0)
Hemoglobin: 12.3 g/dL (ref 12.0–15.0)
Immature Granulocytes: 1 %
Lymphocytes Relative: 23 %
Lymphs Abs: 2.4 10*3/uL (ref 0.7–4.0)
MCH: 28 pg (ref 26.0–34.0)
MCHC: 30.8 g/dL (ref 30.0–36.0)
MCV: 90.9 fL (ref 80.0–100.0)
Monocytes Absolute: 0.7 10*3/uL (ref 0.1–1.0)
Monocytes Relative: 7 %
Neutro Abs: 6.7 10*3/uL (ref 1.7–7.7)
Neutrophils Relative %: 65 %
PLATELETS: 524 10*3/uL — AB (ref 150–400)
RBC: 4.4 MIL/uL (ref 3.87–5.11)
RDW: 17.7 % — ABNORMAL HIGH (ref 11.5–15.5)
WBC: 10.2 10*3/uL (ref 4.0–10.5)
nRBC: 0 % (ref 0.0–0.2)

## 2018-02-20 LAB — MAGNESIUM: Magnesium: 1.9 mg/dL (ref 1.7–2.4)

## 2018-02-20 LAB — COMPREHENSIVE METABOLIC PANEL
ALT: 21 U/L (ref 0–44)
AST: 39 U/L (ref 15–41)
Albumin: 2.8 g/dL — ABNORMAL LOW (ref 3.5–5.0)
Alkaline Phosphatase: 85 U/L (ref 38–126)
Anion gap: 7 (ref 5–15)
BUN: 12 mg/dL (ref 8–23)
CO2: 33 mmol/L — ABNORMAL HIGH (ref 22–32)
CREATININE: 0.4 mg/dL — AB (ref 0.44–1.00)
Calcium: 8.5 mg/dL — ABNORMAL LOW (ref 8.9–10.3)
Chloride: 98 mmol/L (ref 98–111)
GFR calc non Af Amer: >60 mL/min (ref >60)
Glucose, Bld: 96 mg/dL (ref 70–99)
Potassium: 3.6 mmol/L (ref 3.5–5.1)
Sodium: 138 mmol/L (ref 135–145)
Total Bilirubin: 0.7 mg/dL (ref 0.3–1.2)
Total Protein: 5.9 g/dL — ABNORMAL LOW (ref 6.5–8.1)

## 2018-02-20 LAB — BRAIN NATRIURETIC PEPTIDE: B NATRIURETIC PEPTIDE 5: 22 pg/mL (ref 0.0–100.0)

## 2018-02-20 NOTE — Progress Notes (Signed)
[facility]:   [facility] Room Number: [place] of Service:  SNF (31)   CODE STATUS: DNR  Allergies  Allergen Reactions  . Other Swelling  . Lisinopril-Hydrochlorothiazide Swelling    Swelling of the tongue  . Dairy Aid [Lactase] Swelling  . Lisinopril-Hydrochlorothiazide Swelling    Tongue swelling  . Pork-Derived Products Swelling    Chief Complaint  Patient presents with  . Acute Visit    Weight increase of 9 lb    HPI:  She has experienced a weight gain of 9 pounds. She denies any shortness of breath; chest pain; palpitations. Staff report that this AM she had what looked like blood in her stool. There are no reports of blood in her urine; no reports of nausea or vomiting.    Past Medical History:  Diagnosis Date  . Acute blood loss anemia   . Arthritis   . Cancer ([place])    skin  . Central cord syndrome ([place]) 11/29/2017  . Central cord syndrome at C6 level of cervical spinal cord ([place]) 11/29/2017  . Hypertension   . Protein-calorie malnutrition, severe ([place]) 01/24/2018  . S/P insertion of IVC (inferior vena caval) filter 01/24/2018  . 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[facility])     Past Surgical History:  Procedure Laterality Date  . ANTERIOR CERVICAL DECOMP/DISCECTOMY FUSION N/A 11/29/2017   Procedure: Cervical five-six, six-seven Anterior Cervical Decompression Fusion;  Surgeon: Judith Part, MD;  Location: [place];  Service: Neurosurgery;  Laterality: N/A;  Cervical five-six, six-seven Anterior Cervical Decompression Fusion  . CATARACT EXTRACTION    . IR IVC FILTER PLMT / S&I /IMG GUID/MOD SED  12/08/2017    Social History   Socioeconomic History  . Marital status: Single    Spouse name: Not on file  . Number of children: Not on file  . Years of education: Not on file  . Highest education level: Not on file  Occupational History  . Not on file  Social Needs  . Financial resource strain: Not on file  . Food insecurity:    Worry: Not on file     Inability: Not on file  . Transportation needs:    Medical: Not on file    Non-medical: Not on file  Tobacco Use  . Smoking status: Never Smoker  . Smokeless tobacco: Never Used  Substance and Sexual Activity  . Alcohol use: Never    Frequency: Never  . Drug use: Never  . Sexual activity: Not on file  Lifestyle  . Physical activity:    Days per week: Not on file    Minutes per session: Not on file  . Stress: Not on file  Relationships  . Social connections:    Talks on phone: Not on file    Gets together: Not on file    Attends religious service: Not on file    Active member of club or organization: Not on file    Attends meetings of clubs or organizations: Not on file    Relationship status: Not on file  . Intimate partner violence:    Fear of current or ex partner: Not on file    Emotionally abused: Not on file    Physically abused: Not on file    Forced sexual activity: Not on file  Other Topics Concern  . Not on file  Social History Narrative   ** Merged History Encounter **       Family History  Problem Relation Age of Onset  .  Breast cancer Maternal Aunt 75      VITAL SIGNS BP 134/67   Pulse 86   Temp 97.9 F (36.6 C)   Resp 18   Ht 5\' 7"  (1.702 m)   Wt 189 lb 12.8 oz (86.1 kg)   SpO2 95%   BMI 29.73 kg/m   Outpatient Encounter Medications as of 02/20/2018  Medication Sig  . acetaminophen (TYLENOL) 325 MG tablet Take 650 mg by mouth every 6 (six) hours.   . Amino Acids-Protein Hydrolys (FEEDING SUPPLEMENT, PRO-STAT SUGAR FREE 64,) LIQD Take 30 mLs by mouth 2 (two) times daily.  Marland Kitchen amLODipine (NORVASC) 10 MG tablet Take 10 mg by mouth daily after supper.  . bisacodyl (DULCOLAX) 10 MG suppository Place 10 mg rectally daily as needed for moderate constipation.  . calcium carbonate (TUMS EX) 750 MG chewable tablet Chew 1 tablet by mouth 3 (three) times daily.  . capsaicin (ZOSTRIX) 0.025 % cream Apply topically 2 (two) times daily.  . collagenase  (SANTYL) ointment Apply nickel thick amount topically once daily.  Clean left heel wound with normal saline, skin prep periwound, apply santy nickel thick to wound bed and normal saline gauze, cover with allevyn dressing.  . DULoxetine (CYMBALTA) 60 MG capsule Take 60 mg by mouth daily.   . furosemide (LASIX) 40 MG tablet Take 40 mg by mouth daily.  Marland Kitchen gabapentin (NEURONTIN) 400 MG capsule Take 400 mg by mouth every 6 (six) hours.  . Glycerin, Adult, 2.1 g SUPP Place 1 suppository rectally daily at 6 (six) AM.  . loratadine (CLARITIN) 10 MG tablet Take 10 mg by mouth daily.  . methocarbamol (ROBAXIN) 500 MG tablet Take 500 mg by mouth every 6 (six) hours.   . Multiple Vitamin (MULTIVITAMIN WITH MINERALS) TABS tablet Take 1 tablet by mouth daily.  . NON FORMULARY Diet Type:  Regular - NO BEEF, PORK OR DAIRY PRODUCTS  . nortriptyline (PAMELOR) 25 MG capsule Take 25 mg by mouth at bedtime.  Marland Kitchen oxyCODONE (OXY IR/ROXICODONE) 5 MG immediate release tablet Take 1 tablet (5 mg total) by mouth every 3 (three) hours as needed for moderate pain ((score 4 to 6)).  Marland Kitchen polycarbophil (FIBERCON) 625 MG tablet Take 1 tablet (625 mg total) by mouth daily.  Marland Kitchen POTASSIUM CHLORIDE CRYS ER PO Take 40 mEq by mouth 3 (three) times daily.   . Probiotic Product (RISA-BID PROBIOTIC) TABS Take 1 tablet by mouth daily.  . rivaroxaban (XARELTO) 20 MG TABS tablet Take 1 tablet (20 mg total) by mouth daily with supper.  . Skin Protectants, Misc. (ENDIT EX) Apply topically to bottom / peri-area after preforming peri-care  . tamsulosin (FLOMAX) 0.4 MG CAPS capsule Take 1 capsule (0.4 mg total) by mouth daily.  . Wound Dressings (ALLEVYN ADHESIVE EX) Apply dressing to both heels every 3 days   No facility-administered encounter medications on file as of 02/20/2018.      SIGNIFICANT DIAGNOSTIC EXAMS  PREVIOUS:   12-08-17: bilateral  lower extremity venous doppler:  Right: Findings consistent with acute deep vein thrombosis  involving the right femoral vein, and right common femoral vein. No cystic structure found in the popliteal fossa. Left: There is no evidence of deep vein thrombosis in the lower extremity. However, portions of this examination were limited- see technologist comments above. A cystic structure is found in the popliteal fossa.  12-08-17: IVC filter placement   01-16-18: bilateral lower extremity venous doppler:  Right: Findings consistent with acute deep vein thrombosis involving the right posterior  tibial vein, right popliteal vein, right proximal profunda vein, right femoral vein, right common femoral vein, and right peroneal vein. No cystic structure found in  the popliteal fossa. Left: Findings consistent with acute deep vein thrombosis involving the left common femoral vein, left proximal profunda vein, left femoral vein, left popliteal vein, left posterior tibial vein, and left peroneal vein.  01-21-18: KUB: no acute intra-abdominal pathology; findings would support the clinical diagnosis of constipation   01-24-18: chest x-ray; bibasilar infiltrates; pleural effusion and congestive heart failure.   01-30-18: 2-d echo: normal EF 70%  NO NEW EXAMS.      LABS REVIEWED; PREVIOUS:   11-28-17: wbc 8.9; hgb 12.0; hct 38.5; mcv 91.0 plt 232; glucose 138; bun 21; creat 0.88; k+ 3.8; na++ 138; ca 8.7 liver normal albumin 3.5  12-18-17: urine culture: e-coli:  12-19-17: wbc 11.7; hgb 10.2; hct 32.2; mcv 91.7 plt 376; glucose 103; bun 15; creat 0.59; k+ 3.8; na++ 140; ca 8.3; pre-albumin 11.3 01-14-18: urine culture: klebsiella pneumoniae: keflex  01-17-18: wbc 14.7; hgb 10.2; hct 30.4; mcv 83.1; plt 692; glucose 113; bun 18; creat 0.47; k+ 4.7; na++ 128; ca 8.2  01-18-18: wbc 12.6; hgb 9.4; hct 29.0; mcv 83.8; plt 665; glucose 99; bun 17; creat 0.38; k+ 4.4; na++ 130 ca 8.1 01-30-18:wbc 11.8; hgb 9.7; hct 31.3; mcv 89.2; plt 661 glucose 88; bun 7; creat <0.30;  k+ 3.0 na++ 135; ca 8.2 01-03-19:  glucose bun 10; creat 0.33  k+ 2.9; na++136 ca 8.1  02-06-18: glucose 82; bun 11; create 0.39 k+ 3.2; na++ 138  ca 8.2 02-10-18: glucose 78; bun 9; creat 0.42  k+ 2.7; na++ 136 ca 7.9 liver normal albumin 2.2   TODAY:   02-13-18: glucose 86; bun 15; creat 0.44; k+ 3.5; na++ 137; ca 8.5  02-20-18: wbc 10.2; hgb 9.7; hct 31.3; mcv 89.2; plt 524; glucose 96; bun 12; creat 0.40; k+ 3.6; an++ 138; ca 8.5; liver normal albumin 2.8; mag 1.9 BNP 22.0    Review of Systems  Constitutional: Negative for malaise/fatigue.  Respiratory: Negative for cough and shortness of breath.   Cardiovascular: Positive for leg swelling. Negative for chest pain and palpitations.  Gastrointestinal: Negative for abdominal pain, constipation and heartburn.       Has neurogenic bowel; is incontinent   Genitourinary:       Is voiding at times and does require I/O cath at times   Musculoskeletal: Negative for back pain, joint pain and myalgias.  Skin: Negative.   Neurological: Negative for dizziness.  Psychiatric/Behavioral: The patient is not nervous/anxious.     Physical Exam Constitutional:      General: She is not in acute distress.    Appearance: She is well-developed. She is not diaphoretic.  Neck:     Thyroid: No thyromegaly.  Cardiovascular:     Rate and Rhythm: Normal rate and regular rhythm.     Pulses: Normal pulses.     Heart sounds: Normal heart sounds.  Pulmonary:     Effort: Pulmonary effort is normal. No respiratory distress.     Breath sounds: Normal breath sounds.  Abdominal:     General: Bowel sounds are normal. There is no distension.     Palpations: Abdomen is soft.     Tenderness: There is no abdominal tenderness.  Musculoskeletal:     Right lower leg: Edema present.     Left lower leg: Edema present.     Comments: Has bilateral lower extremity edema 3-4+ amount in  lower extremities from thighs down  Is unable to move lower extremities; is having spastic movement which she cannot  feel Is able to move upper extremities    Lymphadenopathy:     Cervical: No cervical adenopathy.  Skin:    General: Skin is warm and dry.  Neurological:     Mental Status: She is alert and oriented to person, place, and time.  Psychiatric:        Mood and Affect: Mood normal.      ASSESSMENT/ PLAN:  TODAY:   1. Bilateral lower extremity edema: is without change: will add aldactone 25 mg daily and will monitor her status.  Will get FOBT      MD is aware of resident's narcotic use and is in agreement with current plan of care. We will attempt to wean resident as apropriate   Ok Edwards NP [facility]  Contact [phone] Monday through Friday 8am- 5pm  After hours call [phone]

## 2018-02-22 ENCOUNTER — Encounter: Payer: Medicare HMO | Attending: Physical Medicine & Rehabilitation | Admitting: Physical Medicine & Rehabilitation

## 2018-02-22 ENCOUNTER — Encounter: Payer: Self-pay | Admitting: Physical Medicine & Rehabilitation

## 2018-02-22 VITALS — BP 106/68 | HR 96 | Resp 14

## 2018-02-22 DIAGNOSIS — M792 Neuralgia and neuritis, unspecified: Secondary | ICD-10-CM

## 2018-02-22 DIAGNOSIS — N319 Neuromuscular dysfunction of bladder, unspecified: Secondary | ICD-10-CM | POA: Diagnosis not present

## 2018-02-22 DIAGNOSIS — S14126S Central cord syndrome at C6 level of cervical spinal cord, sequela: Secondary | ICD-10-CM | POA: Diagnosis not present

## 2018-02-22 NOTE — Progress Notes (Signed)
Subjective:    Patient ID: Sherry [name], female    DOB: 10/19/1934, 84 y.o.   MRN: [id number]  HPI   Sherry [name] is here in follow up of her cervical SCI. She is at [facility]. She is working on balance and dexterity and ROM. She typically sits on edge of mat to work on her trunk.  Facility is looking at acquiring a standing frame.  She was voiding incontinently for awhile at SNF but is not back to retention. She is back to q6 hour caths, volumes have been 400-600+. She has sense of bladder fullness.  She apparently had another UTI  From a bowel standpoint, she is on a bowel program although she's still having stools during day off program.  Is only receiving Dulcolax suppository in the morning.  She may be taking fiber as well.  Does not appear that she is on another softener or laxative.  She is on lasix for edema control which has helped with edema.  This is helped immensely as well as the aggressive compression wrapping that she is receiving.  She has a spot on her left heel which she's being treated. She's on santyl currently.   Her mood has been better.  She is on Cymbalta.  She is maintaining her appetite and is taking in "extra protein".   Her pain is worst in her hands and feet. It's neuropathic in nature, and generally controlled.  She has gabapentin 400 mg 3 times daily ordered.  She also has as needed oxycodone.  She should be on 25 mg of nortriptyline at nighttime in addition to Cymbalta 60 mg daily.    Pain Inventory Average Pain 0 Pain Right Now 0 My pain is intermittent and aching  In the last 24 hours, has pain interfered with the following? General activity 0 Relation with others 0 Enjoyment of life 0 What TIME of day is your pain at its worst? no pain Sleep (in general) Good  Pain is worse with: no pain Pain improves with: no pain Relief from Meds: no pain  Mobility use a wheelchair needs help with transfers  Function retired I need  assistance with the following:  feeding, dressing, bathing, toileting, meal prep, household duties and shopping  Neuro/Psych bladder control problems bowel control problems weakness numbness tingling trouble walking spasms  Prior Studies hospital f/u  Physicians involved in your care Hospital f/u   Family History  Problem Relation Age of Onset  . Breast cancer Maternal Aunt 75   Social History   Socioeconomic History  . Marital status: Single    Spouse name: Not on file  . Number of children: Not on file  . Years of education: Not on file  . Highest education level: Not on file  Occupational History  . Not on file  Social Needs  . Financial resource strain: Not on file  . Food insecurity:    Worry: Not on file    Inability: Not on file  . Transportation needs:    Medical: Not on file    Non-medical: Not on file  Tobacco Use  . Smoking status: Never Smoker  . Smokeless tobacco: Never Used  Substance and Sexual Activity  . Alcohol use: Never    Frequency: Never  . Drug use: Never  . Sexual activity: Not on file  Lifestyle  . Physical activity:    Days per week: Not on file    Minutes per session: Not on file  . Stress: Not  on file  Relationships  . Social connections:    Talks on phone: Not on file    Gets together: Not on file    Attends religious service: Not on file    Active member of club or organization: Not on file    Attends meetings of clubs or organizations: Not on file    Relationship status: Not on file  Other Topics Concern  . Not on file  Social History Narrative   ** Merged History Encounter **       Past Surgical History:  Procedure Laterality Date  . ANTERIOR CERVICAL DECOMP/DISCECTOMY FUSION N/A 11/29/2017   Procedure: Cervical five-six, six-seven Anterior Cervical Decompression Fusion;  Surgeon: Judith Part, MD;  Location: [place];  Service: Neurosurgery;  Laterality: N/A;  Cervical five-six, six-seven Anterior Cervical  Decompression Fusion  . CATARACT EXTRACTION    . IR IVC FILTER PLMT / S&I /IMG GUID/MOD SED  12/08/2017   Past Medical History:  Diagnosis Date  . Acute blood loss anemia   . Arthritis   . Cancer ([place])    skin  . Central cord syndrome ([place]) 11/29/2017  . Central cord syndrome at C6 level of cervical spinal cord ([place]) 11/29/2017  . Hypertension   . Protein-calorie malnutrition, severe ([place]) 01/24/2018  . S/P insertion of IVC (inferior vena caval) filter 01/24/2018  . 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([facility])    BP 106/68   Pulse 96   Resp 14   SpO2 92%   Opioid Risk Score:   Fall Risk Score:  `1  Depression screen PHQ 2/9  No flowsheet data found.  Review of Systems  Constitutional: Positive for unexpected weight change.  HENT: Negative.   Eyes: Negative.   Cardiovascular: Positive for leg swelling.  Gastrointestinal: Positive for abdominal pain.       Neurogenic  Genitourinary: Positive for difficulty urinating.       Neurogenic   Musculoskeletal: Positive for gait problem.       Spasms   Skin: Positive for rash.  Allergic/Immunologic: Negative.   Neurological: Positive for weakness.       Tingling  Psychiatric/Behavioral: Negative.   All other systems reviewed and are negative.      Objective:   Physical Exam   General: Alert and oriented x 3, No apparent distress.  She is in her power wheelchair HEENT: Head is normocephalic, atraumatic, PERRLA, EOMI, sclera anicteric, oral mucosa pink and moist, dentition intact, ext ear canals clear,  Neck: Supple without JVD or lymphadenopathy Heart: Reg rate and rhythm. No murmurs rubs or gallops Chest: CTA bilaterally without wheezes, rales, or rhonchi; no distress Abdomen: Soft, non-tender, non-distended, bowel sounds positive. Extremities: No clubbing, cyanosis, or edema. Pulses are 2+ Skin: Stage II breakdown left heel, diameter approximately 2 to 2-1/2 cm.  Tissue generally pink granulation although perhaps still 10 to 20%  fibronecrotic tissue in place Neuro: Pt is cognitively appropriate with normal insight, memory, and awareness. Cranial nerves 2-12 are intact.. Reflexes are 3+ in all 4's. Fine motor coordination is intact. No tremors. Motor function is grossly 2-3 out of 5 right upper extremity, 2- 2 out of 5 left upper extremity, right lower extremity 1-2 out of 5 proximal distal and left lower extremity trace to 1 out of 5 proximal distal.  Decreased sensation below the level of injury..  Mild resting tone at trace to 1 out of 4 Musculoskeletal: She has extension contractures of both wrist and fingers, left more than right.  She is wearing her TLSO  as well. Psych: Pt's affect is appropriate. Pt is cooperative        Assessment & Plan:  1. 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 Status post C5-6 and C6-7 anterior cervical discectomy and fusion 11/29/2017. TLSO back brace when out of bed                          -Continue SNF therapies             -Discussed therapeutic activities for edema as well as for posture and trunk.  She would benefit from a standing frame at that is an option.  Discussed orthotics for the hands as well as progressive work on finger and wrist flexion. 2. DVT Prophylaxis/Anticoagulation:  Numerous bilateral LE DVT's             -IVCF placed by radiology              --continue xarelto  -edema mgt per primary, elevate, compression, diuretics 3. Pain Management:              Neurontin : 400mg  TID              Continue Cymbalta 60 mg daily            oxycodone as needed for pain 4. Mood:          -ongoing ego support, positive reinforcement encouraged             - -  Cymbalta  5. Neuropsych: This patient is capable of making decisions on her own behalf. 6. Skin/Wound Care: Continue Santyl and current dressing to left heel.  Soon she will be able to change to just to normal dressing, foam dressing 7. Fluids/Electrolytes/Nutrition:                 -Potassium/ electrolyte replacement per primary team especially with aggressive Lasix regimen  -Encourage p.o., protein supplementation                8. Neurogenic bowel and bladder. Flomax 0.4 mg daily.              -I/O caths to keep volumes between 350 cc and 550 cc per cath    -Close observation for UTIs             -Continue to work towards AM bowel program                             -Morning suppository   -May be able to discontinue fiber, continue probiotic                        -

## 2018-02-22 NOTE — Patient Instructions (Addendum)
1. TARGET CATH VOLUMES BETWEEN 350-550 CC URINE  2. IF STOOLS ARE TOO SOFT, PERHAPS FIBER SHOULD BE HELD TO HELP WITH A MORE REGULAR BOWEL PROGRAM/ TO FIRM UP STOOL   3. AGGRESSIVE RANGE OF MOTION OF BOTH HANDS, PARTICULARLY TO ACHIEVE FINGER AND WRIST FLEXION  4. ONCE YOUR HEEL WOUND IS PINK, YOU CAN CHANGE TO FOAM DRESSING ONLY

## 2018-02-23 ENCOUNTER — Telehealth: Payer: Self-pay

## 2018-02-23 NOTE — Telephone Encounter (Signed)
Benjie Karvonen, RN from [facility] in [place] called stating that patient has been producing 700-800 of urine when being cath. She is on a high dose of diuretic and the nurse is asking if she can have a Foley Cath under control.

## 2018-02-24 ENCOUNTER — Encounter: Payer: Self-pay | Admitting: Adult Health

## 2018-02-24 ENCOUNTER — Non-Acute Institutional Stay (SKILLED_NURSING_FACILITY): Payer: Medicare HMO | Admitting: Adult Health

## 2018-02-24 DIAGNOSIS — K592 Neurogenic bowel, not elsewhere classified: Secondary | ICD-10-CM | POA: Diagnosis not present

## 2018-02-24 DIAGNOSIS — S14126S Central cord syndrome at C6 level of cervical spinal cord, sequela: Secondary | ICD-10-CM | POA: Diagnosis not present

## 2018-02-24 DIAGNOSIS — G8250 Quadriplegia, unspecified: Secondary | ICD-10-CM

## 2018-02-24 DIAGNOSIS — N319 Neuromuscular dysfunction of bladder, unspecified: Secondary | ICD-10-CM

## 2018-02-24 DIAGNOSIS — R600 Localized edema: Secondary | ICD-10-CM

## 2018-02-24 DIAGNOSIS — F329 Major depressive disorder, single episode, unspecified: Secondary | ICD-10-CM

## 2018-02-24 NOTE — Progress Notes (Signed)
Location:   [facility] Room Number: 207A Place of Service:  SNF (31)   CODE STATUS: DNR  Allergies  Allergen Reactions  . Other Swelling  . Lisinopril-Hydrochlorothiazide Swelling    Swelling of the tongue  . Dairy Aid [Lactase] Swelling  . Lisinopril-Hydrochlorothiazide Swelling    Tongue swelling  . Pork-Derived Products Swelling    Chief Complaint  Patient presents with  . Medical Management of Chronic Issues    Neurogenic bladder; neurogenic bowel; central cord syndrome at C6 level of cervical spinal cord sequela; tetraparesis; reactive depression; bilateral lower extremity edema.     HPI:  She is a 84 year old long term resident of this facility being seen for the management of her chronic illnesses: neurogenic bowel and bladder; central cord syndrome tetraparesis; reactive depression; bilateral lower extremity edmea. She denies any uncontrolled pain; no nausea; no anxiety no insomnia. Will lower her diuretics.   Past Medical History:  Diagnosis Date  . Acute blood loss anemia   . Arthritis   . Cancer ([place])    skin  . Central cord syndrome ([place]) 11/29/2017  . Central cord syndrome at C6 level of cervical spinal cord ([place]) 11/29/2017  . Hypertension   . Protein-calorie malnutrition, severe ([place]) 01/24/2018  . S/P insertion of IVC (inferior vena caval) filter 01/24/2018  . 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[facility])     Past Surgical History:  Procedure Laterality Date  . ANTERIOR CERVICAL DECOMP/DISCECTOMY FUSION N/A 11/29/2017   Procedure: Cervical five-six, six-seven Anterior Cervical Decompression Fusion;  Surgeon: Judith Part, MD;  Location: [place];  Service: Neurosurgery;  Laterality: N/A;  Cervical five-six, six-seven Anterior Cervical Decompression Fusion  . CATARACT EXTRACTION    . IR IVC FILTER PLMT / S&I /IMG GUID/MOD SED  12/08/2017    Social History   Socioeconomic History  . Marital status: Single    Spouse name: Not on file  . Number  of children: Not on file  . Years of education: Not on file  . Highest education level: Not on file  Occupational History  . Not on file  Social Needs  . Financial resource strain: Not on file  . Food insecurity:    Worry: Not on file    Inability: Not on file  . Transportation needs:    Medical: Not on file    Non-medical: Not on file  Tobacco Use  . Smoking status: Never Smoker  . Smokeless tobacco: Never Used  Substance and Sexual Activity  . Alcohol use: Never    Frequency: Never  . Drug use: Never  . Sexual activity: Not on file  Lifestyle  . Physical activity:    Days per week: Not on file    Minutes per session: Not on file  . Stress: Not on file  Relationships  . Social connections:    Talks on phone: Not on file    Gets together: Not on file    Attends religious service: Not on file    Active member of club or organization: Not on file    Attends meetings of clubs or organizations: Not on file    Relationship status: Not on file  . Intimate partner violence:    Fear of current or ex partner: Not on file    Emotionally abused: Not on file    Physically abused: Not on file    Forced sexual activity: Not on file  Other Topics Concern  . Not on file  Social History Narrative   **  Merged History Encounter **       Family History  Problem Relation Age of Onset  . Breast cancer Maternal Aunt 75      VITAL SIGNS BP 129/73   Pulse 100   Temp 98 F (36.7 C) (Oral)   Resp 20   Ht 5\' 7"  (1.702 m)   Wt 183 lb 9.6 oz (83.3 kg)   SpO2 94%   BMI 28.76 kg/m   Outpatient Encounter Medications as of 02/24/2018  Medication Sig  . acetaminophen (TYLENOL) 325 MG tablet Take 650 mg by mouth every 6 (six) hours.   . Amino Acids-Protein Hydrolys (FEEDING SUPPLEMENT, PRO-STAT SUGAR FREE 64,) LIQD Take 30 mLs by mouth 2 (two) times daily.  Marland Kitchen amLODipine (NORVASC) 10 MG tablet Take 10 mg by mouth daily after supper.  . bisacodyl (DULCOLAX) 10 MG suppository Place 10 mg  rectally daily as needed for moderate constipation.  . calcium carbonate (TUMS EX) 750 MG chewable tablet Chew 1 tablet by mouth 3 (three) times daily.  . capsaicin (ZOSTRIX) 0.025 % cream Apply topically 2 (two) times daily.  . collagenase (SANTYL) ointment Apply nickel thick amount topically once daily.  Clean left heel wound with normal saline, skin prep periwound, apply santy nickel thick to wound bed and normal saline gauze, cover with allevyn dressing.  . DULoxetine (CYMBALTA) 60 MG capsule Take 60 mg by mouth daily.   . furosemide (LASIX) 40 MG tablet Take 40 mg by mouth daily.  Marland Kitchen gabapentin (NEURONTIN) 400 MG capsule Take 400 mg by mouth every 6 (six) hours.  . Glycerin, Adult, 2.1 g SUPP Place 1 suppository rectally daily at 6 (six) AM.  . loratadine (CLARITIN) 10 MG tablet Take 10 mg by mouth daily.  . methocarbamol (ROBAXIN) 500 MG tablet Take 500 mg by mouth every 6 (six) hours.   . Multiple Vitamin (MULTIVITAMIN WITH MINERALS) TABS tablet Take 1 tablet by mouth daily.  . NON FORMULARY Diet Type:  Regular - NO BEEF, PORK OR DAIRY PRODUCTS  . nortriptyline (PAMELOR) 25 MG capsule Take 25 mg by mouth at bedtime.  Marland Kitchen oxyCODONE (OXY IR/ROXICODONE) 5 MG immediate release tablet Take 1 tablet (5 mg total) by mouth every 3 (three) hours as needed for moderate pain ((score 4 to 6)).  Marland Kitchen polycarbophil (FIBERCON) 625 MG tablet Take 1 tablet (625 mg total) by mouth daily.  Marland Kitchen POTASSIUM CHLORIDE CRYS ER PO Take 40 mEq by mouth 3 (three) times daily.   . Probiotic Product (RISA-BID PROBIOTIC) TABS Take 1 tablet by mouth daily.  . rivaroxaban (XARELTO) 20 MG TABS tablet Take 1 tablet (20 mg total) by mouth daily with supper.  . Skin Protectants, Misc. (ENDIT EX) Apply topically to bottom / peri-area after preforming peri-care  . spironolactone (ALDACTONE) 25 MG tablet Take 25 mg by mouth daily.  . tamsulosin (FLOMAX) 0.4 MG CAPS capsule Take 1 capsule (0.4 mg total) by mouth daily.  . Wound Dressings  (ALLEVYN ADHESIVE EX) Apply dressing to both heels every 3 days   No facility-administered encounter medications on file as of 02/24/2018.      SIGNIFICANT DIAGNOSTIC EXAMS     PREVIOUS:   12-08-17: bilateral  lower extremity venous doppler:  Right: Findings consistent with acute deep vein thrombosis involving the right femoral vein, and right common femoral vein. No cystic structure found in the popliteal fossa. Left: There is no evidence of deep vein thrombosis in the lower extremity. However, portions of this examination were limited- see technologist  comments above. A cystic structure is found in the popliteal fossa.  12-08-17: IVC filter placement   01-16-18: bilateral lower extremity venous doppler:  Right: Findings consistent with acute deep vein thrombosis involving the right posterior tibial vein, right popliteal vein, right proximal profunda vein, right femoral vein, right common femoral vein, and right peroneal vein. No cystic structure found in  the popliteal fossa. Left: Findings consistent with acute deep vein thrombosis involving the left common femoral vein, left proximal profunda vein, left femoral vein, left popliteal vein, left posterior tibial vein, and left peroneal vein.  01-21-18: KUB: no acute intra-abdominal pathology; findings would support the clinical diagnosis of constipation   01-24-18: chest x-ray; bibasilar infiltrates; pleural effusion and congestive heart failure.   01-30-18: 2-d echo: normal EF 70%  NO NEW EXAMS.      LABS REVIEWED; PREVIOUS:   11-28-17: wbc 8.9; hgb 12.0; hct 38.5; mcv 91.0 plt 232; glucose 138; bun 21; creat 0.88; k+ 3.8; na++ 138; ca 8.7 liver normal albumin 3.5  12-18-17: urine culture: e-coli:  12-19-17: wbc 11.7; hgb 10.2; hct 32.2; mcv 91.7 plt 376; glucose 103; bun 15; creat 0.59; k+ 3.8; na++ 140; ca 8.3; pre-albumin 11.3 01-14-18: urine culture: klebsiella pneumoniae: keflex  01-17-18: wbc 14.7; hgb 10.2; hct 30.4; mcv  83.1; plt 692; glucose 113; bun 18; creat 0.47; k+ 4.7; na++ 128; ca 8.2  01-18-18: wbc 12.6; hgb 9.4; hct 29.0; mcv 83.8; plt 665; glucose 99; bun 17; creat 0.38; k+ 4.4; na++ 130 ca 8.1 01-30-18:wbc 11.8; hgb 9.7; hct 31.3; mcv 89.2; plt 661 glucose 88; bun 7; creat <0.30;  k+ 3.0 na++ 135; ca 8.2 01-03-19: glucose bun 10; creat 0.33  k+ 2.9; na++136 ca 8.1  02-06-18: glucose 82; bun 11; create 0.39 k+ 3.2; na++ 138  ca 8.2 02-10-18: glucose 78; bun 9; creat 0.42  k+ 2.7; na++ 136 ca 7.9 liver normal albumin 2.2  02-13-18: glucose 86; bun 15; creat 0.44; k+ 3.5; na++ 137; ca 8.5  02-20-18: wbc 10.2; hgb 9.7; hct 31.3; mcv 89.2; plt 524; glucose 96; bun 12; creat 0.40; k+ 3.6; na++ 138; ca 8.5; liver normal albumin 2.8; mag 1.9 BNP 22.0  NO NEW LABS.    Review of Systems  Constitutional: Negative for malaise/fatigue.  Respiratory: Negative for cough and shortness of breath.   Cardiovascular: Negative for chest pain, palpitations and leg swelling.  Gastrointestinal: Negative for abdominal pain, constipation and heartburn.  Musculoskeletal: Negative for back pain, joint pain and myalgias.  Skin: Negative.   Neurological: Negative for dizziness.  Psychiatric/Behavioral: The patient is not nervous/anxious.     Physical Exam Constitutional:      General: She is not in acute distress.    Appearance: She is well-developed. She is not diaphoretic.  Neck:     Musculoskeletal: Neck supple.     Thyroid: No thyromegaly.  Cardiovascular:     Rate and Rhythm: Normal rate and regular rhythm.     Pulses: Normal pulses.     Heart sounds: Normal heart sounds.  Pulmonary:     Effort: Pulmonary effort is normal. No respiratory distress.     Breath sounds: Normal breath sounds.  Abdominal:     General: Bowel sounds are normal. There is no distension.     Palpations: Abdomen is soft.     Tenderness: There is no abdominal tenderness.  Musculoskeletal:     Right lower leg: No edema.     Left lower leg:  Edema present.  Comments: Has bilateral lower extremity edema 2-3+ amount in lower extremities from thighs down  Is unable to move lower extremities; is having spastic movement which she cannot feel Is able to move upper extremities     Lymphadenopathy:     Cervical: No cervical adenopathy.  Skin:    General: Skin is warm and dry.     Comments: Left heel without signs of infection present   Neurological:     Mental Status: She is alert and oriented to person, place, and time.  Psychiatric:        Mood and Affect: Mood normal.        ASSESSMENT/ PLAN:  TODAY:   1. Neurogenic bladder: is without change: will continue flomax 0.4 mg daily is voiding at this time does need I/O cath  2. Neurogenic bowel: is without change: will continue fibercon 625 mg daily and glycerin suppository daily   3. Central cord syndrome at C6 level of cervical spinal cord/tetraparesis/neuropathic pain: is without change: will continue therapy as directed to improve upon her level of independence with her adls.  Will continue pamelor 25 mg nightly tylenol 650 mg every 6 hours and  neurontin to 400 mg every 6 hours. Will continue oxycodone 5 mg every 3 hours as needed.   4. Depression with anxiety: is emotionally stable  will continue cymbalta 60 mg daily   5. Bilateral lower extremity edema: will lower lasix to 40 mg daily and will lower k+ to 40 meq twice daily   PREVIOUS  6. Acute blood loss anemia: is stable hgb 9.7; will monitor   7. Benign essential HTN: is stable b/p 129/73: will continue norvasc 10 mg daily   8. Acute deep vein thrombosis of left lower extremity and right lower extremity: is status post IVC filter placement: is stable will continue xarelto 20 mg daily   9. Hypokalemia: is stable: k+ 3.6: will lower k+ to 40 meq twice daily as her lasix is being decreased will check BMP 03-03-18  10. Hypocalcemia: is without change : ca 7.9: will continue tums 750 mg three times daily   11.  Protein calorie malnutrition severe: albumin 2.2: will conitnue prostat three times daily   12. Chronic allergic rhinitis: is stable will continue claritin 10 mg daily     MD is aware of resident's narcotic use and is in agreement with current plan of care. We will attempt to wean resident as apropriate   Ok Edwards NP [facility]  Contact [phone] Monday through Friday 8am- 5pm  After hours call [phone]

## 2018-02-24 NOTE — Telephone Encounter (Signed)
That would be ok for now. Need to remove as soon as possible as diuretics are reduced.

## 2018-02-24 NOTE — Telephone Encounter (Signed)
Left message on [facility] VM with instructions

## 2018-03-01 ENCOUNTER — Other Ambulatory Visit: Payer: Medicare HMO

## 2018-03-03 ENCOUNTER — Other Ambulatory Visit
Admission: RE | Admit: 2018-03-03 | Discharge: 2018-03-03 | Disposition: A | Discharge status: Home / Self Care | Payer: Medicare HMO | Source: Ambulatory Visit | Attending: Adult Health | Admitting: Adult Health

## 2018-03-03 ENCOUNTER — Encounter: Payer: Self-pay | Admitting: Adult Health

## 2018-03-03 ENCOUNTER — Non-Acute Institutional Stay (SKILLED_NURSING_FACILITY): Payer: Medicare HMO | Admitting: Adult Health

## 2018-03-03 DIAGNOSIS — I110 Hypertensive heart disease with heart failure: Secondary | ICD-10-CM | POA: Insufficient documentation

## 2018-03-03 DIAGNOSIS — R600 Localized edema: Secondary | ICD-10-CM | POA: Diagnosis not present

## 2018-03-03 DIAGNOSIS — N319 Neuromuscular dysfunction of bladder, unspecified: Secondary | ICD-10-CM

## 2018-03-03 LAB — BASIC METABOLIC PANEL
Anion gap: 5 (ref 5–15)
BUN: 14 mg/dL (ref 8–23)
CO2: 30 mmol/L (ref 22–32)
Calcium: 8.7 mg/dL — ABNORMAL LOW (ref 8.9–10.3)
Chloride: 105 mmol/L (ref 98–111)
Creatinine, Ser: 0.38 mg/dL — ABNORMAL LOW (ref 0.44–1.00)
GFR calc Af Amer: >60 mL/min (ref >60)
GFR calc non Af Amer: >60 mL/min (ref >60)
Glucose, Bld: 86 mg/dL (ref 70–99)
POTASSIUM: 4.2 mmol/L (ref 3.5–5.1)
Sodium: 140 mmol/L (ref 135–145)

## 2018-03-03 NOTE — Progress Notes (Signed)
[facility]:   [facility] Room Number: [place] of Service:  SNF (31)   CODE STATUS: DNR  Allergies  Allergen Reactions  . Other Swelling  . Lisinopril-Hydrochlorothiazide Swelling    Swelling of the tongue  . Dairy Aid [Lactase] Swelling  . Lisinopril-Hydrochlorothiazide Swelling    Tongue swelling  . Pork-Derived Products Swelling    Chief Complaint  Patient presents with  . Acute Visit    Neurogenic Bladder    HPI:  She has had a foley placed for the past week due to her significant urine volume. Her weight is presently stable. Will need to remove her foley. She will need to have I/O cath every 6 hours with a bladder scan. She denies any dysuria; no suprapubic pain. No reports fevers present.    Past Medical History:  Diagnosis Date  . Acute blood loss anemia   . Arthritis   . Cancer ([place])    skin  . Central cord syndrome ([place]) 11/29/2017  . Central cord syndrome at C6 level of cervical spinal cord ([place]) 11/29/2017  . Hypertension   . Protein-calorie malnutrition, severe ([place]) 01/24/2018  . S/P insertion of IVC (inferior vena caval) filter 01/24/2018  . 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[facility])     Past Surgical History:  Procedure Laterality Date  . ANTERIOR CERVICAL DECOMP/DISCECTOMY FUSION N/A 11/29/2017   Procedure: Cervical five-six, six-seven Anterior Cervical Decompression Fusion;  Surgeon: Judith Part, MD;  Location: [place];  Service: Neurosurgery;  Laterality: N/A;  Cervical five-six, six-seven Anterior Cervical Decompression Fusion  . CATARACT EXTRACTION    . IR IVC FILTER PLMT / S&I /IMG GUID/MOD SED  12/08/2017    Social History   Socioeconomic History  . Marital status: Single    Spouse name: Not on file  . Number of children: Not on file  . Years of education: Not on file  . Highest education level: Not on file  Occupational History  . Not on file  Social Needs  . Financial resource strain: Not on file  . Food insecurity:   Worry: Not on file    Inability: Not on file  . Transportation needs:    Medical: Not on file    Non-medical: Not on file  Tobacco Use  . Smoking status: Never Smoker  . Smokeless tobacco: Never Used  Substance and Sexual Activity  . Alcohol use: Never    Frequency: Never  . Drug use: Never  . Sexual activity: Not on file  Lifestyle  . Physical activity:    Days per week: Not on file    Minutes per session: Not on file  . Stress: Not on file  Relationships  . Social connections:    Talks on phone: Not on file    Gets together: Not on file    Attends religious service: Not on file    Active member of club or organization: Not on file    Attends meetings of clubs or organizations: Not on file    Relationship status: Not on file  . Intimate partner violence:    Fear of current or ex partner: Not on file    Emotionally abused: Not on file    Physically abused: Not on file    Forced sexual activity: Not on file  Other Topics Concern  . Not on file  Social History Narrative   ** Merged History Encounter **       Family History  Problem Relation Age of Onset  .  Breast cancer Maternal Aunt 75      VITAL SIGNS BP 111/68   Pulse 93   Temp 98 F (36.7 C)   Resp 18   Ht 5\' 7"  (1.702 m)   Wt 186 lb (84.4 kg)   SpO2 96%   BMI 29.13 kg/m   Outpatient Encounter Medications as of 03/03/2018  Medication Sig  . acetaminophen (TYLENOL) 325 MG tablet Take 650 mg by mouth every 6 (six) hours.   . Amino Acids-Protein Hydrolys (FEEDING SUPPLEMENT, PRO-STAT SUGAR FREE 64,) LIQD Take 30 mLs by mouth 2 (two) times daily.  Marland Kitchen amLODipine (NORVASC) 10 MG tablet Take 10 mg by mouth daily after supper.  . bisacodyl (DULCOLAX) 10 MG suppository Place 10 mg rectally daily as needed for moderate constipation.  . calcium carbonate (TUMS EX) 750 MG chewable tablet Chew 1 tablet by mouth 3 (three) times daily.  . capsaicin (ZOSTRIX) 0.025 % cream Apply topically 2 (two) times daily.  .  collagenase (SANTYL) ointment Apply nickel thick amount topically once daily.  Clean left heel wound with normal saline, skin prep periwound, apply santy nickel thick to wound bed and normal saline gauze, cover with allevyn dressing.  . DULoxetine (CYMBALTA) 60 MG capsule Take 60 mg by mouth daily.   . furosemide (LASIX) 40 MG tablet Take 40 mg by mouth daily.  Marland Kitchen gabapentin (NEURONTIN) 400 MG capsule Take 400 mg by mouth every 6 (six) hours.  . Glycerin, Adult, 2.1 g SUPP Place 1 suppository rectally daily at 6 (six) AM.  . loratadine (CLARITIN) 10 MG tablet Take 10 mg by mouth daily.  . methocarbamol (ROBAXIN) 500 MG tablet Take 500 mg by mouth every 6 (six) hours.   . Multiple Vitamin (MULTIVITAMIN WITH MINERALS) TABS tablet Take 1 tablet by mouth daily.  . NON FORMULARY Diet Type:  Regular - NO BEEF, PORK OR DAIRY PRODUCTS  . nortriptyline (PAMELOR) 25 MG capsule Take 25 mg by mouth at bedtime.  Marland Kitchen oxyCODONE (OXY IR/ROXICODONE) 5 MG immediate release tablet Take 1 tablet (5 mg total) by mouth every 3 (three) hours as needed for moderate pain ((score 4 to 6)).  Marland Kitchen polycarbophil (FIBERCON) 625 MG tablet Take 1 tablet (625 mg total) by mouth daily.  Derrill Memo ON 12/26/2018] POTASSIUM CHLORIDE CRYS ER PO Take 40 mEq by mouth 2 (two) times daily.   . Probiotic Product (RISA-BID PROBIOTIC) TABS Take 1 tablet by mouth daily.  . rivaroxaban (XARELTO) 20 MG TABS tablet Take 1 tablet (20 mg total) by mouth daily with supper.  . Skin Protectants, Misc. (ENDIT EX) Apply topically to bottom / peri-area after preforming peri-care  . spironolactone (ALDACTONE) 25 MG tablet Take 25 mg by mouth daily.  . tamsulosin (FLOMAX) 0.4 MG CAPS capsule Take 1 capsule (0.4 mg total) by mouth daily.  . Wound Dressings (ALLEVYN ADHESIVE EX) Apply dressing to both heels every 3 days   No facility-administered encounter medications on file as of 03/03/2018.      SIGNIFICANT DIAGNOSTIC EXAMS  PREVIOUS:   12-08-17:  bilateral  lower extremity venous doppler:  Right: Findings consistent with acute deep vein thrombosis involving the right femoral vein, and right common femoral vein. No cystic structure found in the popliteal fossa. Left: There is no evidence of deep vein thrombosis in the lower extremity. However, portions of this examination were limited- see technologist comments above. A cystic structure is found in the popliteal fossa.  12-08-17: IVC filter placement   01-16-18: bilateral lower extremity venous  doppler:  Right: Findings consistent with acute deep vein thrombosis involving the right posterior tibial vein, right popliteal vein, right proximal profunda vein, right femoral vein, right common femoral vein, and right peroneal vein. No cystic structure found in  the popliteal fossa. Left: Findings consistent with acute deep vein thrombosis involving the left common femoral vein, left proximal profunda vein, left femoral vein, left popliteal vein, left posterior tibial vein, and left peroneal vein.  01-21-18: KUB: no acute intra-abdominal pathology; findings would support the clinical diagnosis of constipation   01-24-18: chest x-ray; bibasilar infiltrates; pleural effusion and congestive heart failure.   01-30-18: 2-d echo: normal EF 70%  NO NEW EXAMS.      LABS REVIEWED; PREVIOUS:   11-28-17: wbc 8.9; hgb 12.0; hct 38.5; mcv 91.0 plt 232; glucose 138; bun 21; creat 0.88; k+ 3.8; na++ 138; ca 8.7 liver normal albumin 3.5  12-18-17: urine culture: e-coli:  12-19-17: wbc 11.7; hgb 10.2; hct 32.2; mcv 91.7 plt 376; glucose 103; bun 15; creat 0.59; k+ 3.8; na++ 140; ca 8.3; pre-albumin 11.3 01-14-18: urine culture: klebsiella pneumoniae: keflex  01-17-18: wbc 14.7; hgb 10.2; hct 30.4; mcv 83.1; plt 692; glucose 113; bun 18; creat 0.47; k+ 4.7; na++ 128; ca 8.2  01-18-18: wbc 12.6; hgb 9.4; hct 29.0; mcv 83.8; plt 665; glucose 99; bun 17; creat 0.38; k+ 4.4; na++ 130 ca 8.1 01-30-18:wbc 11.8;  hgb 9.7; hct 31.3; mcv 89.2; plt 661 glucose 88; bun 7; creat <0.30;  k+ 3.0 na++ 135; ca 8.2 01-03-19: glucose bun 10; creat 0.33  k+ 2.9; na++136 ca 8.1  02-06-18: glucose 82; bun 11; create 0.39 k+ 3.2; na++ 138  ca 8.2 02-10-18: glucose 78; bun 9; creat 0.42  k+ 2.7; na++ 136 ca 7.9 liver normal albumin 2.2  02-13-18: glucose 86; bun 15; creat 0.44; k+ 3.5; na++ 137; ca 8.5  02-20-18: wbc 10.2; hgb 9.7; hct 31.3; mcv 89.2; plt 524; glucose 96; bun 12; creat 0.40; k+ 3.6; na++ 138; ca 8.5; liver normal albumin 2.8; mag 1.9 BNP 22.0  NO NEW LABS.     Review of Systems  Constitutional: Negative for malaise/fatigue.  Respiratory: Negative for cough and shortness of breath.   Cardiovascular: Negative for chest pain, palpitations and leg swelling.  Gastrointestinal: Negative for abdominal pain, constipation and heartburn.  Musculoskeletal: Negative for back pain, joint pain and myalgias.  Skin: Negative.   Neurological: Negative for dizziness.  Psychiatric/Behavioral: The patient is not nervous/anxious.     Physical Exam Constitutional:      General: She is not in acute distress.    Appearance: She is well-developed. She is not diaphoretic.  Neck:     Thyroid: No thyromegaly.  Cardiovascular:     Rate and Rhythm: Normal rate and regular rhythm.     Pulses: Normal pulses.     Heart sounds: Normal heart sounds.  Pulmonary:     Effort: Pulmonary effort is normal. No respiratory distress.     Breath sounds: Normal breath sounds.  Abdominal:     General: Bowel sounds are normal. There is no distension.     Palpations: Abdomen is soft.     Tenderness: There is no abdominal tenderness.  Musculoskeletal:     Right lower leg: Edema present.     Left lower leg: Edema present.     Comments: Has bilateral lower extremity edema 1-2+ amount in lower extremities from thighs down  Is unable to move lower extremities; is having spastic movement which she cannot  feel Is able to move upper  extremities       Lymphadenopathy:     Cervical: No cervical adenopathy.  Skin:    General: Skin is warm and dry.  Neurological:     Mental Status: She is alert and oriented to person, place, and time.  Psychiatric:        Mood and Affect: Mood normal.      ASSESSMENT/ PLAN:  TODAY:   1. Neurogenic bladder: 2. Bilateral lower extremity edema:   Will stop lasix and k+  Will check BMP and Mag on 03-10-18 Will remove foley: will I/O cath every 6 hours as needed; will scan bladder    MD is aware of resident's narcotic use and is in agreement with current plan of care. We will attempt to wean resident as apropriate   Ok Edwards NP [facility]  Contact [phone] Monday through Friday 8am- 5pm  After hours call [phone]

## 2018-03-04 ENCOUNTER — Encounter
Admission: RE | Admit: 2018-03-04 | Discharge: 2018-03-04 | Disposition: A | Discharge status: Home / Self Care | Payer: Medicare HMO | Source: Ambulatory Visit | Attending: Internal Medicine | Admitting: Internal Medicine

## 2018-03-08 ENCOUNTER — Non-Acute Institutional Stay (SKILLED_NURSING_FACILITY): Payer: Medicare HMO | Admitting: Adult Health

## 2018-03-08 ENCOUNTER — Encounter: Payer: Self-pay | Admitting: Adult Health

## 2018-03-08 DIAGNOSIS — R600 Localized edema: Secondary | ICD-10-CM | POA: Diagnosis not present

## 2018-03-08 NOTE — Progress Notes (Signed)
[facility]:   [facility] [place] Number: [id number] a Place of Service:  SNF (31)   CODE STATUS: DNR  Allergies  Allergen Reactions  . Other Swelling  . Lisinopril-Hydrochlorothiazide Swelling    Swelling of the tongue  . Dairy Aid [Lactase] Swelling  . Lisinopril-Hydrochlorothiazide Swelling    Tongue swelling  . Pork-Derived Products Swelling    Chief Complaint  Patient presents with  . Acute Visit    Lower Extremity Edema    HPI:  She is complaining of worsening edema. She feels as though her legs are more swollen and has swelling in her abdomen. She denies any chest pain or shortness of breath. She is voiding; is requiring I/O cath every 6 hours. There are no reports of fevers present.    Past Medical History:  Diagnosis Date  . Acute blood loss anemia   . Arthritis   . Cancer ([place])    skin  . Central cord syndrome ([place]) 11/29/2017  . Central cord syndrome at C6 level of cervical spinal cord ([place]) 11/29/2017  . Hypertension   . Protein-calorie malnutrition, severe ([place]) 01/24/2018  . S/P insertion of IVC (inferior vena caval) filter 01/24/2018  . 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[facility])     Past Surgical History:  Procedure Laterality Date  . ANTERIOR CERVICAL DECOMP/DISCECTOMY FUSION N/A 11/29/2017   Procedure: Cervical five-six, six-seven Anterior Cervical Decompression Fusion;  Surgeon: Judith Part, MD;  Location: [place];  Service: Neurosurgery;  Laterality: N/A;  Cervical five-six, six-seven Anterior Cervical Decompression Fusion  . CATARACT EXTRACTION    . IR IVC FILTER PLMT / S&I /IMG GUID/MOD SED  12/08/2017    Social History   Socioeconomic History  . Marital status: Single    Spouse name: Not on file  . Number of children: Not on file  . Years of education: Not on file  . Highest education level: Not on file  Occupational History  . Not on file  Social Needs  . Financial resource strain: Not on file  . Food insecurity:    Worry: Not on  file    Inability: Not on file  . Transportation needs:    Medical: Not on file    Non-medical: Not on file  Tobacco Use  . Smoking status: Never Smoker  . Smokeless tobacco: Never Used  Substance and Sexual Activity  . Alcohol use: Never    Frequency: Never  . Drug use: Never  . Sexual activity: Not on file  Lifestyle  . Physical activity:    Days per week: Not on file    Minutes per session: Not on file  . Stress: Not on file  Relationships  . Social connections:    Talks on phone: Not on file    Gets together: Not on file    Attends religious service: Not on file    Active member of club or organization: Not on file    Attends meetings of clubs or organizations: Not on file    Relationship status: Not on file  . Intimate partner violence:    Fear of current or ex partner: Not on file    Emotionally abused: Not on file    Physically abused: Not on file    Forced sexual activity: Not on file  Other Topics Concern  . Not on file  Social History Narrative   ** Merged History Encounter **       Family History  Problem Relation Age of Onset  . Breast cancer  Maternal Aunt 75      VITAL SIGNS BP 112/62   Pulse 95   Temp 98.5 F (36.9 C)   Resp 17   Ht 5\' 7"  (1.702 m)   Wt 185 lb 11.2 oz (84.2 kg)   SpO2 93%   BMI 29.08 kg/m   Outpatient Encounter Medications as of 03/08/2018  Medication Sig  . acetaminophen (TYLENOL) 325 MG tablet Take 650 mg by mouth every 6 (six) hours.   . Amino Acids-Protein Hydrolys (FEEDING SUPPLEMENT, PRO-STAT SUGAR FREE 64,) LIQD Take 30 mLs by mouth 2 (two) times daily.  Marland Kitchen amLODipine (NORVASC) 10 MG tablet Take 10 mg by mouth daily after supper.  . bisacodyl (DULCOLAX) 10 MG suppository Place 10 mg rectally daily as needed for moderate constipation.  . calcium carbonate (TUMS EX) 750 MG chewable tablet Chew 1 tablet by mouth 3 (three) times daily.  . capsaicin (ZOSTRIX) 0.025 % cream Apply topically 2 (two) times daily.  . collagenase  (SANTYL) ointment Apply nickel thick amount topically once daily.  Clean left heel wound with normal saline, skin prep periwound, apply santy nickel thick to wound bed and normal saline gauze, cover with allevyn dressing.  . DULoxetine (CYMBALTA) 60 MG capsule Take 60 mg by mouth daily.   Marland Kitchen gabapentin (NEURONTIN) 400 MG capsule Take 400 mg by mouth every 6 (six) hours.  . Glycerin, Adult, 2.1 g SUPP Place 1 suppository rectally daily at 6 (six) AM.  . loratadine (CLARITIN) 10 MG tablet Take 10 mg by mouth daily.  . Multiple Vitamin (MULTIVITAMIN WITH MINERALS) TABS tablet Take 1 tablet by mouth daily.  . NON FORMULARY Diet Type:  Regular - NO BEEF, PORK OR DAIRY PRODUCTS  . nortriptyline (PAMELOR) 25 MG capsule Take 25 mg by mouth at bedtime.  Marland Kitchen oxyCODONE (OXY IR/ROXICODONE) 5 MG immediate release tablet Take 1 tablet (5 mg total) by mouth every 3 (three) hours as needed for moderate pain ((score 4 to 6)).  Marland Kitchen polycarbophil (FIBERCON) 625 MG tablet Take 1 tablet (625 mg total) by mouth daily.  . Probiotic Product (RISA-BID PROBIOTIC) TABS Take 1 tablet by mouth daily.  . rivaroxaban (XARELTO) 20 MG TABS tablet Take 1 tablet (20 mg total) by mouth daily with supper.  . Skin Protectants, Misc. (ENDIT EX) Apply topically to bottom / peri-area after preforming peri-care  . spironolactone (ALDACTONE) 25 MG tablet Take 25 mg by mouth daily.  . tamsulosin (FLOMAX) 0.4 MG CAPS capsule Take 1 capsule (0.4 mg total) by mouth daily.  . tizanidine (ZANAFLEX) 2 MG capsule Take 2 mg by mouth every 8 (eight) hours.  . Wound Dressings (ALLEVYN ADHESIVE EX) Apply dressing to both heels every 3 days  . [DISCONTINUED] furosemide (LASIX) 40 MG tablet Take 40 mg by mouth daily.  . [DISCONTINUED] methocarbamol (ROBAXIN) 500 MG tablet Take 500 mg by mouth every 6 (six) hours.   . [DISCONTINUED] POTASSIUM CHLORIDE CRYS ER PO Take 40 mEq by mouth 2 (two) times daily.    No facility-administered encounter medications on  file as of 03/08/2018.      SIGNIFICANT DIAGNOSTIC EXAMS  PREVIOUS:   12-08-17: bilateral  lower extremity venous doppler:  Right: Findings consistent with acute deep vein thrombosis involving the right femoral vein, and right common femoral vein. No cystic structure found in the popliteal fossa. Left: There is no evidence of deep vein thrombosis in the lower extremity. However, portions of this examination were limited- see technologist comments above. A cystic structure is found  in the popliteal fossa.  12-08-17: IVC filter placement   01-16-18: bilateral lower extremity venous doppler:  Right: Findings consistent with acute deep vein thrombosis involving the right posterior tibial vein, right popliteal vein, right proximal profunda vein, right femoral vein, right common femoral vein, and right peroneal vein. No cystic structure found in  the popliteal fossa. Left: Findings consistent with acute deep vein thrombosis involving the left common femoral vein, left proximal profunda vein, left femoral vein, left popliteal vein, left posterior tibial vein, and left peroneal vein.  01-21-18: KUB: no acute intra-abdominal pathology; findings would support the clinical diagnosis of constipation   01-24-18: chest x-ray; bibasilar infiltrates; pleural effusion and congestive heart failure.   01-30-18: 2-d echo: normal EF 70%  NO NEW EXAMS.      LABS REVIEWED; PREVIOUS:   11-28-17: wbc 8.9; hgb 12.0; hct 38.5; mcv 91.0 plt 232; glucose 138; bun 21; creat 0.88; k+ 3.8; na++ 138; ca 8.7 liver normal albumin 3.5  12-18-17: urine culture: e-coli:  12-19-17: wbc 11.7; hgb 10.2; hct 32.2; mcv 91.7 plt 376; glucose 103; bun 15; creat 0.59; k+ 3.8; na++ 140; ca 8.3; pre-albumin 11.3 01-14-18: urine culture: klebsiella pneumoniae: keflex  01-17-18: wbc 14.7; hgb 10.2; hct 30.4; mcv 83.1; plt 692; glucose 113; bun 18; creat 0.47; k+ 4.7; na++ 128; ca 8.2  01-18-18: wbc 12.6; hgb 9.4; hct 29.0; mcv 83.8;  plt 665; glucose 99; bun 17; creat 0.38; k+ 4.4; na++ 130 ca 8.1 01-30-18:wbc 11.8; hgb 9.7; hct 31.3; mcv 89.2; plt 661 glucose 88; bun 7; creat <0.30;  k+ 3.0 na++ 135; ca 8.2 01-03-19: glucose bun 10; creat 0.33  k+ 2.9; na++136 ca 8.1  02-06-18: glucose 82; bun 11; create 0.39 k+ 3.2; na++ 138  ca 8.2 02-10-18: glucose 78; bun 9; creat 0.42  k+ 2.7; na++ 136 ca 7.9 liver normal albumin 2.2  02-13-18: glucose 86; bun 15; creat 0.44; k+ 3.5; na++ 137; ca 8.5  02-20-18: wbc 10.2; hgb 9.7; hct 31.3; mcv 89.2; plt 524; glucose 96; bun 12; creat 0.40; k+ 3.6; na++ 138; ca 8.5; liver normal albumin 2.8; mag 1.9 BNP 22.0  TODAY:   03-03-18: glucose 96; bun 14; creat 0.38 k+ 4.2; na++ 140 ca 8.7      Review of Systems  Constitutional: Negative for malaise/fatigue.  Respiratory: Negative for cough and shortness of breath.   Cardiovascular: Positive for leg swelling. Negative for chest pain and palpitations.  Gastrointestinal: Negative for abdominal pain, constipation and heartburn.       Ha swelling   Musculoskeletal: Negative for back pain, joint pain and myalgias.  Skin: Negative.   Neurological: Negative for dizziness.  Psychiatric/Behavioral: The patient is not nervous/anxious.     Physical Exam Constitutional:      General: She is not in acute distress.    Appearance: She is well-developed. She is not diaphoretic.  Neck:     Musculoskeletal: Neck supple.     Thyroid: No thyromegaly.  Cardiovascular:     Rate and Rhythm: Normal rate and regular rhythm.     Pulses: Normal pulses.     Heart sounds: Normal heart sounds.  Pulmonary:     Effort: Pulmonary effort is normal. No respiratory distress.     Breath sounds: Normal breath sounds.  Abdominal:     General: Bowel sounds are normal. There is no distension.     Palpations: Abdomen is soft.     Tenderness: There is no abdominal tenderness.  Musculoskeletal:  Right lower leg: Edema present.     Left lower leg: Edema present.      Comments: Has bilateral lower extremity edema 3+ amount in lower extremities from thighs down  Is unable to move lower extremities; is having spastic movement which she cannot feel Is able to move upper extremities      Lymphadenopathy:     Cervical: No cervical adenopathy.  Skin:    General: Skin is warm and dry.  Neurological:     Mental Status: She is alert and oriented to person, place, and time.  Psychiatric:        Mood and Affect: Mood normal.      ASSESSMENT/ PLAN:  TODAY:   1.Bilateral lower extremity edema:   Is worse ;will increase aldactone to 25 mg twice daily  Will recheck BMP and mag on 03-13-18     MD is aware of resident's narcotic use and is in agreement with current plan of care. We will attempt to wean resident as apropriate   Ok Edwards NP [facility]  Contact [phone] Monday through Friday 8am- 5pm  After hours call [phone]

## 2018-03-09 ENCOUNTER — Encounter: Payer: Self-pay | Admitting: Adult Health

## 2018-03-09 ENCOUNTER — Non-Acute Institutional Stay (SKILLED_NURSING_FACILITY): Payer: Medicare HMO | Admitting: Adult Health

## 2018-03-09 DIAGNOSIS — N319 Neuromuscular dysfunction of bladder, unspecified: Secondary | ICD-10-CM

## 2018-03-09 NOTE — Progress Notes (Signed)
[facility] Room Number: [place] of Service:  SNF (31)   CODE STATUS: DNR  Allergies  Allergen Reactions  . Other Swelling  . Lisinopril-Hydrochlorothiazide Swelling    Swelling of the tongue  . Dairy Aid [Lactase] Swelling  . Lisinopril-Hydrochlorothiazide Swelling    Tongue swelling  . Pork-Derived Products Swelling    Chief Complaint  Patient presents with  . Acute Visit    Abdominal pain, urinary retention    HPI:  She is responding to the increased aldactone. Her urine output has increased; she is retaining urine over 1000 cc. She will need a foley insertion. Performing I/O cath every 4 hours will greatly interfere with her quality of life and her ability to sleep. I have spoken with her family and are in agreement with this plan of care. She is having abdominal pain; and is voiding at times. There are no reports of fevers; she is drinking adequate fluids    Past Medical History:  Diagnosis Date  . Acute blood loss anemia   . Arthritis   . Cancer ([place])    skin  . Central cord syndrome ([place]) 11/29/2017  . Central cord syndrome at C6 level of cervical spinal cord ([place]) 11/29/2017  . Hypertension   . Protein-calorie malnutrition, severe ([place]) 01/24/2018  . S/P insertion of IVC (inferior vena caval) filter 01/24/2018  . 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[facility])     Past Surgical History:  Procedure Laterality Date  . ANTERIOR CERVICAL DECOMP/DISCECTOMY FUSION N/A 11/29/2017   Procedure: Cervical five-six, six-seven Anterior Cervical Decompression Fusion;  Surgeon: Judith Part, MD;  Location: [place];  Service: Neurosurgery;  Laterality: N/A;  Cervical five-six, six-seven Anterior Cervical Decompression Fusion  . CATARACT EXTRACTION    . IR IVC FILTER PLMT / S&I /IMG GUID/MOD SED  12/08/2017    Social History   Socioeconomic History  . Marital status: Single    Spouse name: Not on file  . Number of children: Not on file  . Years of  education: Not on file  . Highest education level: Not on file  Occupational History  . Not on file  Social Needs  . Financial resource strain: Not on file  . Food insecurity:    Worry: Not on file    Inability: Not on file  . Transportation needs:    Medical: Not on file    Non-medical: Not on file  Tobacco Use  . Smoking status: Never Smoker  . Smokeless tobacco: Never Used  Substance and Sexual Activity  . Alcohol use: Never    Frequency: Never  . Drug use: Never  . Sexual activity: Not on file  Lifestyle  . Physical activity:    Days per week: Not on file    Minutes per session: Not on file  . Stress: Not on file  Relationships  . Social connections:    Talks on phone: Not on file    Gets together: Not on file    Attends religious service: Not on file    Active member of club or organization: Not on file    Attends meetings of clubs or organizations: Not on file    Relationship status: Not on file  . Intimate partner violence:    Fear of current or ex partner: Not on file    Emotionally abused: Not on file    Physically abused: Not on file    Forced sexual activity: Not on file  Other Topics Concern  .  Not on file  Social History Narrative   ** Merged History Encounter **       Family History  Problem Relation Age of Onset  . Breast cancer Maternal Aunt 75      VITAL SIGNS BP 129/68   Pulse 96   Temp 98.9 F (37.2 C)   Resp 20   Ht 5\' 7"  (1.702 m)   Wt 186 lb 1.6 oz (84.4 kg)   SpO2 94%   BMI 29.15 kg/m   Outpatient Encounter Medications as of 03/09/2018  Medication Sig  . acetaminophen (TYLENOL) 325 MG tablet Take 650 mg by mouth every 6 (six) hours.   . Amino Acids-Protein Hydrolys (FEEDING SUPPLEMENT, PRO-STAT SUGAR FREE 64,) LIQD Take 30 mLs by mouth 2 (two) times daily.  Marland Kitchen amLODipine (NORVASC) 10 MG tablet Take 10 mg by mouth daily after supper.  . bisacodyl (DULCOLAX) 10 MG suppository Place 10 mg rectally daily as needed for moderate  constipation.  . calcium carbonate (TUMS EX) 750 MG chewable tablet Chew 1 tablet by mouth 3 (three) times daily.  . capsaicin (ZOSTRIX) 0.025 % cream Apply topically 2 (two) times daily.  . collagenase (SANTYL) ointment Apply nickel thick amount topically once daily.  Clean left heel wound with normal saline, skin prep periwound, apply santy nickel thick to wound bed and normal saline gauze, cover with allevyn dressing.  . DULoxetine (CYMBALTA) 60 MG capsule Take 60 mg by mouth daily.   Marland Kitchen gabapentin (NEURONTIN) 400 MG capsule Take 400 mg by mouth every 6 (six) hours.  . Glycerin, Adult, 2.1 g SUPP Place 1 suppository rectally daily at 6 (six) AM.  . loratadine (CLARITIN) 10 MG tablet Take 10 mg by mouth daily.  . Multiple Vitamin (MULTIVITAMIN WITH MINERALS) TABS tablet Take 1 tablet by mouth daily.  . NON FORMULARY Diet Type:  Regular - NO BEEF, PORK OR DAIRY PRODUCTS  . nortriptyline (PAMELOR) 25 MG capsule Take 25 mg by mouth at bedtime.  Marland Kitchen oxyCODONE (OXY IR/ROXICODONE) 5 MG immediate release tablet Take 1 tablet (5 mg total) by mouth every 3 (three) hours as needed for moderate pain ((score 4 to 6)).  Marland Kitchen polycarbophil (FIBERCON) 625 MG tablet Take 1 tablet (625 mg total) by mouth daily.  . Probiotic Product (RISA-BID PROBIOTIC) TABS Take 1 tablet by mouth daily.  . rivaroxaban (XARELTO) 20 MG TABS tablet Take 1 tablet (20 mg total) by mouth daily with supper.  . Skin Protectants, Misc. (ENDIT EX) Apply topically to bottom / peri-area after preforming peri-care  . spironolactone (ALDACTONE) 25 MG tablet Take 25 mg by mouth daily.  . tamsulosin (FLOMAX) 0.4 MG CAPS capsule Take 1 capsule (0.4 mg total) by mouth daily.  . tizanidine (ZANAFLEX) 2 MG capsule Take 2 mg by mouth every 8 (eight) hours.  . Wound Dressings (ALLEVYN ADHESIVE EX) Apply dressing to both heels every 3 days   No facility-administered encounter medications on file as of 03/09/2018.      SIGNIFICANT DIAGNOSTIC  EXAMS   PREVIOUS:   12-08-17: bilateral  lower extremity venous doppler:  Right: Findings consistent with acute deep vein thrombosis involving the right femoral vein, and right common femoral vein. No cystic structure found in the popliteal fossa. Left: There is no evidence of deep vein thrombosis in the lower extremity. However, portions of this examination were limited- see technologist comments above. A cystic structure is found in the popliteal fossa.  12-08-17: IVC filter placement   01-16-18: bilateral lower extremity venous doppler:  Right: Findings consistent with acute deep vein thrombosis involving the right posterior tibial vein, right popliteal vein, right proximal profunda vein, right femoral vein, right common femoral vein, and right peroneal vein. No cystic structure found in  the popliteal fossa. Left: Findings consistent with acute deep vein thrombosis involving the left common femoral vein, left proximal profunda vein, left femoral vein, left popliteal vein, left posterior tibial vein, and left peroneal vein.  01-21-18: KUB: no acute intra-abdominal pathology; findings would support the clinical diagnosis of constipation   01-24-18: chest x-ray; bibasilar infiltrates; pleural effusion and congestive heart failure.   01-30-18: 2-d echo: normal EF 70%  NO NEW EXAMS.      LABS REVIEWED; PREVIOUS:   11-28-17: wbc 8.9; hgb 12.0; hct 38.5; mcv 91.0 plt 232; glucose 138; bun 21; creat 0.88; k+ 3.8; na++ 138; ca 8.7 liver normal albumin 3.5  12-18-17: urine culture: e-coli:  12-19-17: wbc 11.7; hgb 10.2; hct 32.2; mcv 91.7 plt 376; glucose 103; bun 15; creat 0.59; k+ 3.8; na++ 140; ca 8.3; pre-albumin 11.3 01-14-18: urine culture: klebsiella pneumoniae: keflex  01-17-18: wbc 14.7; hgb 10.2; hct 30.4; mcv 83.1; plt 692; glucose 113; bun 18; creat 0.47; k+ 4.7; na++ 128; ca 8.2  01-18-18: wbc 12.6; hgb 9.4; hct 29.0; mcv 83.8; plt 665; glucose 99; bun 17; creat 0.38; k+ 4.4; na++  130 ca 8.1 01-30-18:wbc 11.8; hgb 9.7; hct 31.3; mcv 89.2; plt 661 glucose 88; bun 7; creat <0.30;  k+ 3.0 na++ 135; ca 8.2 01-03-19: glucose bun 10; creat 0.33  k+ 2.9; na++136 ca 8.1  02-06-18: glucose 82; bun 11; create 0.39 k+ 3.2; na++ 138  ca 8.2 02-10-18: glucose 78; bun 9; creat 0.42  k+ 2.7; na++ 136 ca 7.9 liver normal albumin 2.2  02-13-18: glucose 86; bun 15; creat 0.44; k+ 3.5; na++ 137; ca 8.5  02-20-18: wbc 10.2; hgb 9.7; hct 31.3; mcv 89.2; plt 524; glucose 96; bun 12; creat 0.40; k+ 3.6; na++ 138; ca 8.5; liver normal albumin 2.8; mag 1.9 BNP 22.0 03-03-18: glucose 96; bun 14; creat 0.38 k+ 4.2; na++ 140 ca 8.7     NO NEW LABS.    Review of Systems  Constitutional: Negative for malaise/fatigue.  Respiratory: Negative for cough and shortness of breath.   Cardiovascular: Positive for leg swelling. Negative for chest pain and palpitations.  Gastrointestinal: Negative for abdominal pain, constipation and heartburn.  Genitourinary:       Is retaining urine   Musculoskeletal: Negative for back pain, joint pain and myalgias.  Skin: Negative.   Neurological: Negative for dizziness.  Psychiatric/Behavioral: The patient is not nervous/anxious.    Physical Exam Constitutional:      General: She is not in acute distress.    Appearance: She is well-developed. She is not diaphoretic.  Neck:     Musculoskeletal: Neck supple.     Thyroid: No thyromegaly.  Cardiovascular:     Rate and Rhythm: Normal rate and regular rhythm.     Pulses: Normal pulses.     Heart sounds: Normal heart sounds.  Pulmonary:     Effort: Pulmonary effort is normal. No respiratory distress.     Breath sounds: Normal breath sounds.  Abdominal:     General: Bowel sounds are normal. There is no distension.     Palpations: Abdomen is soft.     Tenderness: There is no abdominal tenderness.  Genitourinary:    Comments: Foley inserted has bladder distension  Musculoskeletal:     Right lower  leg: Edema present.      Left lower leg: Edema present.     Comments: Has bilateral lower extremity edema 3+ amount in lower extremities from thighs down  Is unable to move lower extremities; is having spastic movement which she cannot feel Is able to move upper extremities      Lymphadenopathy:     Cervical: No cervical adenopathy.  Skin:    General: Skin is warm and dry.  Neurological:     Mental Status: She is alert and oriented to person, place, and time.  Psychiatric:        Mood and Affect: Mood normal.     ASSESSMENT/ PLAN:  TODAY:   1. Neurogenic bladder: is worse: will insert foley at this time; she is not able to tolerate having the foley out. Family is aware of her status.      MD is aware of resident's narcotic use and is in agreement with current plan of care. We will attempt to wean resident as apropriate   Ok Edwards NP [facility]  Contact [phone] Monday through Friday 8am- 5pm  After hours call [phone]

## 2018-03-10 ENCOUNTER — Other Ambulatory Visit: Payer: Self-pay | Admitting: Adult Health

## 2018-03-10 ENCOUNTER — Non-Acute Institutional Stay (SKILLED_NURSING_FACILITY): Payer: Medicare HMO | Admitting: Adult Health

## 2018-03-10 ENCOUNTER — Encounter: Payer: Self-pay | Admitting: Adult Health

## 2018-03-10 ENCOUNTER — Other Ambulatory Visit
Admission: RE | Admit: 2018-03-10 | Discharge: 2018-03-10 | Disposition: A | Discharge status: Home / Self Care | Payer: Medicare HMO | Source: Skilled Nursing Facility | Attending: Adult Health | Admitting: Adult Health

## 2018-03-10 DIAGNOSIS — I110 Hypertensive heart disease with heart failure: Secondary | ICD-10-CM | POA: Insufficient documentation

## 2018-03-10 DIAGNOSIS — S14126S Central cord syndrome at C6 level of cervical spinal cord, sequela: Secondary | ICD-10-CM | POA: Diagnosis not present

## 2018-03-10 DIAGNOSIS — M792 Neuralgia and neuritis, unspecified: Secondary | ICD-10-CM

## 2018-03-10 LAB — BASIC METABOLIC PANEL
ANION GAP: 5 (ref 5–15)
BUN: 15 mg/dL (ref 8–23)
CO2: 29 mmol/L (ref 22–32)
Calcium: 8.6 mg/dL — ABNORMAL LOW (ref 8.9–10.3)
Chloride: 103 mmol/L (ref 98–111)
Creatinine, Ser: 0.31 mg/dL — ABNORMAL LOW (ref 0.44–1.00)
GFR calc Af Amer: >60 mL/min (ref >60)
GFR calc non Af Amer: >60 mL/min (ref >60)
Glucose, Bld: 93 mg/dL (ref 70–99)
Potassium: 3.7 mmol/L (ref 3.5–5.1)
Sodium: 137 mmol/L (ref 135–145)

## 2018-03-10 LAB — MAGNESIUM: Magnesium: 1.9 mg/dL (ref 1.7–2.4)

## 2018-03-10 MED ORDER — OXYCODONE HCL 5 MG PO TABS: 5.0000 mg | ORAL_TABLET | Freq: Two times a day (BID) | ORAL | 0 refills | Status: DC | PRN

## 2018-03-10 NOTE — Progress Notes (Signed)
[facility]   [facility] Room Number: [place] of Service:  SNF (31)   CODE STATUS: DNR  Allergies  Allergen Reactions  . Other Swelling  . Lisinopril-Hydrochlorothiazide Swelling    Swelling of the tongue  . Dairy Aid [Lactase] Swelling  . Lisinopril-Hydrochlorothiazide Swelling    Tongue swelling  . Pork-Derived Products Swelling    Chief Complaint  Patient presents with  . Acute Visit    Pain Management    HPI:  We have discussed her pain management. She is presently on oxycodone 5 mg every 3 hours as needed. She states that she is taking this medication about one time a day as needed. She feels as though her pain has greatly improved. She is taking gabapentin four times daily and her insurance will pay for 3 times daily. I have spoken with her daughter; they are will to pay for the extra medication. She denies any changes in appetite; no uncontrolled pain; no insomnia.    Past Medical History:  Diagnosis Date  . Acute blood loss anemia   . Arthritis   . Cancer ([place])    skin  . Central cord syndrome ([place]) 11/29/2017  . Central cord syndrome at C6 level of cervical spinal cord ([place]) 11/29/2017  . Hypertension   . Protein-calorie malnutrition, severe ([place]) 01/24/2018  . S/P insertion of IVC (inferior vena caval) filter 01/24/2018  . 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[facility])     Past Surgical History:  Procedure Laterality Date  . ANTERIOR CERVICAL DECOMP/DISCECTOMY FUSION N/A 11/29/2017   Procedure: Cervical five-six, six-seven Anterior Cervical Decompression Fusion;  Surgeon: Judith Part, MD;  Location: [place];  Service: Neurosurgery;  Laterality: N/A;  Cervical five-six, six-seven Anterior Cervical Decompression Fusion  . CATARACT EXTRACTION    . IR IVC FILTER PLMT / S&I /IMG GUID/MOD SED  12/08/2017    Social History   Socioeconomic History  . Marital status: Single    Spouse name: Not on file  . Number of children: Not on file  . Years of  education: Not on file  . Highest education level: Not on file  Occupational History  . Not on file  Social Needs  . Financial resource strain: Not on file  . Food insecurity:    Worry: Not on file    Inability: Not on file  . Transportation needs:    Medical: Not on file    Non-medical: Not on file  Tobacco Use  . Smoking status: Never Smoker  . Smokeless tobacco: Never Used  Substance and Sexual Activity  . Alcohol use: Never    Frequency: Never  . Drug use: Never  . Sexual activity: Not on file  Lifestyle  . Physical activity:    Days per week: Not on file    Minutes per session: Not on file  . Stress: Not on file  Relationships  . Social connections:    Talks on phone: Not on file    Gets together: Not on file    Attends religious service: Not on file    Active member of club or organization: Not on file    Attends meetings of clubs or organizations: Not on file    Relationship status: Not on file  . Intimate partner violence:    Fear of current or ex partner: Not on file    Emotionally abused: Not on file    Physically abused: Not on file    Forced sexual activity: Not on file  Other  Topics Concern  . Not on file  Social History Narrative   ** Merged History Encounter **       Family History  Problem Relation Age of Onset  . Breast cancer Maternal Aunt 75      VITAL SIGNS BP 121/64   Pulse 77   Temp 98.6 F (37 C)   Resp 18   Ht 5\' 7"  (1.702 m)   Wt 181 lb (82.1 kg)   SpO2 94%   BMI 28.35 kg/m   Outpatient Encounter Medications as of 03/10/2018  Medication Sig  . acetaminophen (TYLENOL) 325 MG tablet Take 650 mg by mouth every 6 (six) hours.   . Amino Acids-Protein Hydrolys (FEEDING SUPPLEMENT, PRO-STAT SUGAR FREE 64,) LIQD Take 30 mLs by mouth 2 (two) times daily.  Marland Kitchen amLODipine (NORVASC) 10 MG tablet Take 10 mg by mouth daily after supper.  . bisacodyl (DULCOLAX) 10 MG suppository Place 10 mg rectally daily as needed for moderate constipation.    . calcium carbonate (TUMS EX) 750 MG chewable tablet Chew 1 tablet by mouth 3 (three) times daily.  . capsaicin (ZOSTRIX) 0.025 % cream Apply topically 2 (two) times daily.  . collagenase (SANTYL) ointment Apply nickel thick amount topically once daily.  Clean left heel wound with normal saline, skin prep periwound, apply santy nickel thick to wound bed and normal saline gauze, cover with allevyn dressing.  . DULoxetine (CYMBALTA) 60 MG capsule Take 60 mg by mouth daily.   Marland Kitchen gabapentin (NEURONTIN) 400 MG capsule Take 400 mg by mouth every 6 (six) hours.  . Glycerin, Adult, 2.1 g SUPP Place 1 suppository rectally daily at 6 (six) AM.  . loratadine (CLARITIN) 10 MG tablet Take 10 mg by mouth daily.  . Multiple Vitamin (MULTIVITAMIN WITH MINERALS) TABS tablet Take 1 tablet by mouth daily.  . NON FORMULARY Diet Type:  Regular - NO BEEF, PORK OR DAIRY PRODUCTS  . nortriptyline (PAMELOR) 25 MG capsule Take 25 mg by mouth at bedtime.  Marland Kitchen oxyCODONE (OXY IR/ROXICODONE) 5 MG immediate release tablet Take 1 tablet (5 mg total) by mouth every 3 (three) hours as needed for moderate pain ((score 4 to 6)).  Marland Kitchen polycarbophil (FIBERCON) 625 MG tablet Take 1 tablet (625 mg total) by mouth daily.  . Probiotic Product (RISA-BID PROBIOTIC) TABS Take 1 tablet by mouth daily.  . rivaroxaban (XARELTO) 20 MG TABS tablet Take 1 tablet (20 mg total) by mouth daily with supper.  . Skin Protectants, Misc. (ENDIT EX) Apply topically to bottom / peri-area after preforming peri-care  . spironolactone (ALDACTONE) 25 MG tablet Take 25 mg by mouth daily.  . tamsulosin (FLOMAX) 0.4 MG CAPS capsule Take 1 capsule (0.4 mg total) by mouth daily.  . tizanidine (ZANAFLEX) 2 MG capsule Take 2 mg by mouth every 8 (eight) hours.  . Wound Dressings (ALLEVYN ADHESIVE EX) Apply dressing to both heels every 3 days   No facility-administered encounter medications on file as of 03/10/2018.      SIGNIFICANT DIAGNOSTIC EXAMS   PREVIOUS:    12-08-17: bilateral  lower extremity venous doppler:  Right: Findings consistent with acute deep vein thrombosis involving the right femoral vein, and right common femoral vein. No cystic structure found in the popliteal fossa. Left: There is no evidence of deep vein thrombosis in the lower extremity. However, portions of this examination were limited- see technologist comments above. A cystic structure is found in the popliteal fossa.  12-08-17: IVC filter placement   01-16-18: bilateral lower  extremity venous doppler:  Right: Findings consistent with acute deep vein thrombosis involving the right posterior tibial vein, right popliteal vein, right proximal profunda vein, right femoral vein, right common femoral vein, and right peroneal vein. No cystic structure found in  the popliteal fossa. Left: Findings consistent with acute deep vein thrombosis involving the left common femoral vein, left proximal profunda vein, left femoral vein, left popliteal vein, left posterior tibial vein, and left peroneal vein.  01-21-18: KUB: no acute intra-abdominal pathology; findings would support the clinical diagnosis of constipation   01-24-18: chest x-ray; bibasilar infiltrates; pleural effusion and congestive heart failure.   01-30-18: 2-d echo: normal EF 70%  NO NEW EXAMS.      LABS REVIEWED; PREVIOUS:   11-28-17: wbc 8.9; hgb 12.0; hct 38.5; mcv 91.0 plt 232; glucose 138; bun 21; creat 0.88; k+ 3.8; na++ 138; ca 8.7 liver normal albumin 3.5  12-18-17: urine culture: e-coli:  12-19-17: wbc 11.7; hgb 10.2; hct 32.2; mcv 91.7 plt 376; glucose 103; bun 15; creat 0.59; k+ 3.8; na++ 140; ca 8.3; pre-albumin 11.3 01-14-18: urine culture: klebsiella pneumoniae: keflex  01-17-18: wbc 14.7; hgb 10.2; hct 30.4; mcv 83.1; plt 692; glucose 113; bun 18; creat 0.47; k+ 4.7; na++ 128; ca 8.2  01-18-18: wbc 12.6; hgb 9.4; hct 29.0; mcv 83.8; plt 665; glucose 99; bun 17; creat 0.38; k+ 4.4; na++ 130 ca  8.1 01-30-18:wbc 11.8; hgb 9.7; hct 31.3; mcv 89.2; plt 661 glucose 88; bun 7; creat <0.30;  k+ 3.0 na++ 135; ca 8.2 01-03-19: glucose bun 10; creat 0.33  k+ 2.9; na++136 ca 8.1  02-06-18: glucose 82; bun 11; create 0.39 k+ 3.2; na++ 138  ca 8.2 02-10-18: glucose 78; bun 9; creat 0.42  k+ 2.7; na++ 136 ca 7.9 liver normal albumin 2.2  02-13-18: glucose 86; bun 15; creat 0.44; k+ 3.5; na++ 137; ca 8.5  02-20-18: wbc 10.2; hgb 9.7; hct 31.3; mcv 89.2; plt 524; glucose 96; bun 12; creat 0.40; k+ 3.6; na++ 138; ca 8.5; liver normal albumin 2.8; mag 1.9 BNP 22.0 03-03-18: glucose 96; bun 14; creat 0.38 k+ 4.2; na++ 140 ca 8.7     NO NEW LABS.   Review of Systems  Constitutional: Negative for malaise/fatigue.  Respiratory: Negative for cough and shortness of breath.   Cardiovascular: Positive for leg swelling. Negative for chest pain and palpitations.  Gastrointestinal: Negative for abdominal pain, constipation and heartburn.  Musculoskeletal: Negative for back pain, joint pain and myalgias.  Skin: Negative.   Neurological: Negative for dizziness.  Psychiatric/Behavioral: The patient is not nervous/anxious.     Physical Exam Constitutional:      General: She is not in acute distress.    Appearance: She is well-developed. She is not diaphoretic.  Neck:     Musculoskeletal: Neck supple.     Thyroid: No thyromegaly.  Cardiovascular:     Rate and Rhythm: Normal rate and regular rhythm.     Pulses: Normal pulses.     Heart sounds: Normal heart sounds.  Pulmonary:     Effort: Pulmonary effort is normal. No respiratory distress.     Breath sounds: Normal breath sounds.  Abdominal:     General: Bowel sounds are normal. There is no distension.     Palpations: Abdomen is soft.     Tenderness: There is no abdominal tenderness.  Genitourinary:    Comments: Has foley  Musculoskeletal:     Right lower leg: Edema present.     Left lower leg: Edema  present.     Comments: Has bilateral lower  extremity edema 2+ amount in lower extremities from thighs down  Is unable to move lower extremities; is having spastic movement which she cannot feel Is able to move upper extremities       Lymphadenopathy:     Cervical: No cervical adenopathy.  Skin:    General: Skin is warm and dry.  Neurological:     Mental Status: She is alert and oriented to person, place, and time.  Psychiatric:        Mood and Affect: Mood normal.      ASSESSMENT/ PLAN:  TODAY;   1. Central cord syndrome at C6; level of cervical spinal cord 2. Neuropathic pain  Is stable  Will change oxycodone to 5 mg twice daily as needed through 03-17-18 Will continue gabapentin 400 mg four times daily    MD is aware of resident's narcotic use and is in agreement with current plan of care. We will attempt to wean resident as apropriate   Ok Edwards NP [facility]  Contact [phone] Monday through Friday 8am- 5pm  After hours call [phone]

## 2018-03-13 ENCOUNTER — Other Ambulatory Visit
Admission: RE | Admit: 2018-03-13 | Discharge: 2018-03-13 | Disposition: A | Discharge status: Home / Self Care | Payer: Medicare HMO | Source: Ambulatory Visit | Attending: Adult Health | Admitting: Adult Health

## 2018-03-13 DIAGNOSIS — I110 Hypertensive heart disease with heart failure: Secondary | ICD-10-CM | POA: Insufficient documentation

## 2018-03-13 LAB — BASIC METABOLIC PANEL
Anion gap: 7 (ref 5–15)
BUN: 16 mg/dL (ref 8–23)
CHLORIDE: 103 mmol/L (ref 98–111)
CO2: 28 mmol/L (ref 22–32)
Calcium: 8.8 mg/dL — ABNORMAL LOW (ref 8.9–10.3)
Creatinine, Ser: 0.34 mg/dL — ABNORMAL LOW (ref 0.44–1.00)
GFR calc non Af Amer: >60 mL/min (ref >60)
Glucose, Bld: 87 mg/dL (ref 70–99)
Potassium: 3.9 mmol/L (ref 3.5–5.1)
Sodium: 138 mmol/L (ref 135–145)

## 2018-03-13 LAB — MAGNESIUM: Magnesium: 1.9 mg/dL (ref 1.7–2.4)

## 2018-03-17 ENCOUNTER — Non-Acute Institutional Stay (SKILLED_NURSING_FACILITY): Payer: Medicare HMO | Admitting: Adult Health

## 2018-03-17 ENCOUNTER — Other Ambulatory Visit: Payer: Self-pay | Admitting: Adult Health

## 2018-03-17 ENCOUNTER — Encounter: Payer: Self-pay | Admitting: Adult Health

## 2018-03-17 DIAGNOSIS — M792 Neuralgia and neuritis, unspecified: Secondary | ICD-10-CM

## 2018-03-17 DIAGNOSIS — N319 Neuromuscular dysfunction of bladder, unspecified: Secondary | ICD-10-CM

## 2018-03-17 DIAGNOSIS — S14126S Central cord syndrome at C6 level of cervical spinal cord, sequela: Secondary | ICD-10-CM | POA: Diagnosis not present

## 2018-03-17 MED ORDER — OXYCODONE HCL 5 MG PO TABS: 2.5000 mg | ORAL_TABLET | Freq: Two times a day (BID) | ORAL | 0 refills | Status: DC | PRN

## 2018-03-17 NOTE — Progress Notes (Signed)
[facility]   [facility] Room Number: [place] of Service:  SNF (31)   CODE STATUS: DNR  Allergies  Allergen Reactions  . Other Swelling  . Lisinopril-Hydrochlorothiazide Swelling    Swelling of the tongue  . Dairy Aid [Lactase] Swelling  . Lisinopril-Hydrochlorothiazide Swelling    Tongue swelling  . Pork-Derived Products Swelling    Chief Complaint  Patient presents with  . Acute Visit    Pain Management    HPI:  We have come together to discuss her pain management. She has been using the oxycodone 5 mg periodically. We have discussed the effectiveness of narcotic pain relief over time. We have discussed addiction issues as well. She has verbalized understanding. She is willing to lower the dose of her oxycodone to 2.5 mg. She has pain related with central cord syndrome at C6 level of cervical spine. She is status post anterior cervical decompression/discectomy fusion. She has a chronic foley due to neurogenic bladder. We have discussed treatment options and will setup a urology consult for further treatment options other than I/O cath every 4 hours; which will not give her any quality in life.   Past Medical History:  Diagnosis Date  . Acute blood loss anemia   . Arthritis   . Cancer ([place])    skin  . Central cord syndrome ([place]) 11/29/2017  . Central cord syndrome at C6 level of cervical spinal cord ([place]) 11/29/2017  . Hypertension   . Protein-calorie malnutrition, severe ([place]) 01/24/2018  . S/P insertion of IVC (inferior vena caval) filter 01/24/2018  . 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[facility])     Past Surgical History:  Procedure Laterality Date  . ANTERIOR CERVICAL DECOMP/DISCECTOMY FUSION N/A 11/29/2017   Procedure: Cervical five-six, six-seven Anterior Cervical Decompression Fusion;  Surgeon: Judith Part, MD;  Location: [place];  Service: Neurosurgery;  Laterality: N/A;  Cervical five-six, six-seven Anterior Cervical Decompression Fusion  . CATARACT  EXTRACTION    . IR IVC FILTER PLMT / S&I /IMG GUID/MOD SED  12/08/2017    Social History   Socioeconomic History  . Marital status: Single    Spouse name: Not on file  . Number of children: Not on file  . Years of education: Not on file  . Highest education level: Not on file  Occupational History  . Not on file  Social Needs  . Financial resource strain: Not on file  . Food insecurity:    Worry: Not on file    Inability: Not on file  . Transportation needs:    Medical: Not on file    Non-medical: Not on file  Tobacco Use  . Smoking status: Never Smoker  . Smokeless tobacco: Never Used  Substance and Sexual Activity  . Alcohol use: Never    Frequency: Never  . Drug use: Never  . Sexual activity: Not on file  Lifestyle  . Physical activity:    Days per week: Not on file    Minutes per session: Not on file  . Stress: Not on file  Relationships  . Social connections:    Talks on phone: Not on file    Gets together: Not on file    Attends religious service: Not on file    Active member of club or organization: Not on file    Attends meetings of clubs or organizations: Not on file    Relationship status: Not on file  . Intimate partner violence:    Fear of current or ex partner:  Not on file    Emotionally abused: Not on file    Physically abused: Not on file    Forced sexual activity: Not on file  Other Topics Concern  . Not on file  Social History Narrative   ** Merged History Encounter **       Family History  Problem Relation Age of Onset  . Breast cancer Maternal Aunt 75      VITAL SIGNS BP 131/85   Pulse 100   Temp 98.3 F (36.8 C)   Resp 20   Ht 5\' 7"  (1.702 m)   Wt 178 lb (80.7 kg)   SpO2 95%   BMI 27.88 kg/m   Outpatient Encounter Medications as of 03/17/2018  Medication Sig  . acetaminophen (TYLENOL) 325 MG tablet Take 650 mg by mouth every 6 (six) hours.   . Amino Acids-Protein Hydrolys (FEEDING SUPPLEMENT, PRO-STAT SUGAR FREE 64,) LIQD  Take 30 mLs by mouth 2 (two) times daily.  Marland Kitchen amLODipine (NORVASC) 10 MG tablet Take 10 mg by mouth daily after supper.  . bisacodyl (DULCOLAX) 10 MG suppository Place 10 mg rectally daily as needed for moderate constipation.  . calcium carbonate (TUMS EX) 750 MG chewable tablet Chew 1 tablet by mouth 3 (three) times daily.  . capsaicin (ZOSTRIX) 0.025 % cream Apply topically 2 (two) times daily.  . collagenase (SANTYL) ointment Apply nickel thick amount topically once daily.  Clean left heel wound with normal saline, skin prep periwound, apply santy nickel thick to wound bed and normal saline gauze, cover with allevyn dressing.  . DULoxetine (CYMBALTA) 60 MG capsule Take 60 mg by mouth daily.   Marland Kitchen gabapentin (NEURONTIN) 400 MG capsule Take 400 mg by mouth every 6 (six) hours.  . Glycerin, Adult, 2.1 g SUPP Place 1 suppository rectally daily at 6 (six) AM.  . hydrocortisone (ANUSOL-HC) 2.5 % rectal cream Place 1 application rectally 2 (two) times daily.  Marland Kitchen loratadine (CLARITIN) 10 MG tablet Take 10 mg by mouth daily.  . Multiple Vitamin (MULTIVITAMIN WITH MINERALS) TABS tablet Take 1 tablet by mouth daily.  . NON FORMULARY Diet Type:  Regular - NO BEEF, PORK OR DAIRY PRODUCTS  . nortriptyline (PAMELOR) 25 MG capsule Take 25 mg by mouth at bedtime.  Oneta Rack Supplies (SKIN PREP WIPES) MISC Apply to both heals daily  . oxyCODONE (OXY IR/ROXICODONE) 5 MG immediate release tablet Take 1 tablet (5 mg total) by mouth every 12 (twelve) hours as needed for up to 7 days for moderate pain ((score 4 to 6)).  . Probiotic Product (RISA-BID PROBIOTIC) TABS Take 1 tablet by mouth daily.  . rivaroxaban (XARELTO) 20 MG TABS tablet Take 1 tablet (20 mg total) by mouth daily with supper.  . Skin Protectants, Misc. (ENDIT EX) Apply topically to bottom / peri-area after preforming peri-care  . spironolactone (ALDACTONE) 25 MG tablet Take 25 mg by mouth 2 (two) times daily.   . tamsulosin (FLOMAX) 0.4 MG CAPS capsule  Take 1 capsule (0.4 mg total) by mouth daily.  . tizanidine (ZANAFLEX) 2 MG capsule Take 2 mg by mouth every 8 (eight) hours.  . [DISCONTINUED] polycarbophil (FIBERCON) 625 MG tablet Take 1 tablet (625 mg total) by mouth daily. (Patient not taking: Reported on 03/17/2018)  . [DISCONTINUED] Wound Dressings (ALLEVYN ADHESIVE EX) Apply dressing to both heels every 3 days   No facility-administered encounter medications on file as of 03/17/2018.      SIGNIFICANT DIAGNOSTIC EXAMS  PREVIOUS:   12-08-17: bilateral  lower extremity venous doppler:  Right: Findings consistent with acute deep vein thrombosis involving the right femoral vein, and right common femoral vein. No cystic structure found in the popliteal fossa. Left: There is no evidence of deep vein thrombosis in the lower extremity. However, portions of this examination were limited- see technologist comments above. A cystic structure is found in the popliteal fossa.  12-08-17: IVC filter placement   01-16-18: bilateral lower extremity venous doppler:  Right: Findings consistent with acute deep vein thrombosis involving the right posterior tibial vein, right popliteal vein, right proximal profunda vein, right femoral vein, right common femoral vein, and right peroneal vein. No cystic structure found in  the popliteal fossa. Left: Findings consistent with acute deep vein thrombosis involving the left common femoral vein, left proximal profunda vein, left femoral vein, left popliteal vein, left posterior tibial vein, and left peroneal vein.  01-21-18: KUB: no acute intra-abdominal pathology; findings would support the clinical diagnosis of constipation   01-24-18: chest x-ray; bibasilar infiltrates; pleural effusion and congestive heart failure.   01-30-18: 2-d echo: normal EF 70%  NO NEW EXAMS.      LABS REVIEWED; PREVIOUS:   11-28-17: wbc 8.9; hgb 12.0; hct 38.5; mcv 91.0 plt 232; glucose 138; bun 21; creat 0.88; k+ 3.8; na++ 138; ca  8.7 liver normal albumin 3.5  12-18-17: urine culture: e-coli:  12-19-17: wbc 11.7; hgb 10.2; hct 32.2; mcv 91.7 plt 376; glucose 103; bun 15; creat 0.59; k+ 3.8; na++ 140; ca 8.3; pre-albumin 11.3 01-14-18: urine culture: klebsiella pneumoniae: keflex  01-17-18: wbc 14.7; hgb 10.2; hct 30.4; mcv 83.1; plt 692; glucose 113; bun 18; creat 0.47; k+ 4.7; na++ 128; ca 8.2  01-18-18: wbc 12.6; hgb 9.4; hct 29.0; mcv 83.8; plt 665; glucose 99; bun 17; creat 0.38; k+ 4.4; na++ 130 ca 8.1 01-30-18:wbc 11.8; hgb 9.7; hct 31.3; mcv 89.2; plt 661 glucose 88; bun 7; creat <0.30;  k+ 3.0 na++ 135; ca 8.2 01-03-19: glucose bun 10; creat 0.33  k+ 2.9; na++136 ca 8.1  02-06-18: glucose 82; bun 11; create 0.39 k+ 3.2; na++ 138  ca 8.2 02-10-18: glucose 78; bun 9; creat 0.42  k+ 2.7; na++ 136 ca 7.9 liver normal albumin 2.2  02-13-18: glucose 86; bun 15; creat 0.44; k+ 3.5; na++ 137; ca 8.5  02-20-18: wbc 10.2; hgb 9.7; hct 31.3; mcv 89.2; plt 524; glucose 96; bun 12; creat 0.40; k+ 3.6; na++ 138; ca 8.5; liver normal albumin 2.8; mag 1.9 BNP 22.0 03-03-18: glucose 96; bun 14; creat 0.38 k+ 4.2; na++ 140 ca 8.7     NO NEW LABS.   Review of Systems  Constitutional: Negative for malaise/fatigue.  Respiratory: Negative for cough and shortness of breath.   Cardiovascular: Negative for chest pain, palpitations and leg swelling.  Gastrointestinal: Negative for abdominal pain, constipation and heartburn.  Musculoskeletal: Negative for back pain, joint pain and myalgias.  Skin: Negative.   Neurological: Negative for dizziness.  Psychiatric/Behavioral: The patient is not nervous/anxious.     Physical Exam Constitutional:      General: She is not in acute distress.    Appearance: She is well-developed. She is not diaphoretic.  Neck:     Musculoskeletal: Neck supple.     Thyroid: No thyromegaly.  Cardiovascular:     Rate and Rhythm: Normal rate and regular rhythm.     Pulses: Normal pulses.     Heart sounds:  Normal heart sounds.  Pulmonary:     Effort: Pulmonary effort is  normal. No respiratory distress.     Breath sounds: Normal breath sounds.  Abdominal:     General: Bowel sounds are normal. There is no distension.     Palpations: Abdomen is soft.     Tenderness: There is no abdominal tenderness.  Genitourinary:    Comments: Has foley  Musculoskeletal:     Right lower leg: Edema present.     Left lower leg: Edema present.     Comments: Has bilateral lower extremity edema 2+ amount in lower extremities from thighs down  Is unable to move lower extremities; is having spastic movement which she cannot feel Is able to move upper extremities       Uses back brace   Lymphadenopathy:     Cervical: No cervical adenopathy.  Skin:    General: Skin is warm and dry.  Neurological:     Mental Status: She is alert and oriented to person, place, and time.  Psychiatric:        Mood and Affect: Mood normal.     ASSESSMENT/ PLAN:  TODAY;   1. Central cord syndrome at C6; level of cervical spinal cord 2. Neuropathic pain 3. Neurogenic bladder  Will lower her oxycodone to 2.5 mg twice daily as needed through 03-24-18 Will set up urology consult Will give a pneumovax     MD is aware of resident's narcotic use and is in agreement with current plan of care. We will attempt to wean resident as apropriate   Ok Edwards NP [facility]  Contact [phone] Monday through Friday 8am- 5pm  After hours call [phone]

## 2018-03-24 ENCOUNTER — Encounter: Payer: Self-pay | Admitting: Adult Health

## 2018-03-24 ENCOUNTER — Non-Acute Institutional Stay (SKILLED_NURSING_FACILITY): Payer: Medicare HMO | Admitting: Adult Health

## 2018-03-24 DIAGNOSIS — I10 Essential (primary) hypertension: Secondary | ICD-10-CM

## 2018-03-24 DIAGNOSIS — N319 Neuromuscular dysfunction of bladder, unspecified: Secondary | ICD-10-CM

## 2018-03-24 DIAGNOSIS — M792 Neuralgia and neuritis, unspecified: Secondary | ICD-10-CM

## 2018-03-24 DIAGNOSIS — I82593 Chronic embolism and thrombosis of other specified deep vein of lower extremity, bilateral: Secondary | ICD-10-CM

## 2018-03-24 DIAGNOSIS — S14129S Central cord syndrome at unspecified level of cervical spinal cord, sequela: Secondary | ICD-10-CM | POA: Diagnosis not present

## 2018-03-24 MED ORDER — OXYCODONE HCL 5 MG PO TABS: ORAL_TABLET | ORAL | 0 refills | Status: DC

## 2018-03-24 NOTE — Progress Notes (Signed)
[facility]:  [facility] Number: [id number] Place of Service:  SNF (31) Provider:  Durenda Age, NP  Patient Care Team: Patient, No Pcp Per as PCP - General (General Practice) Glendon Axe, MD (Internal Medicine)  Extended Emergency Contact Information Primary Emergency Contact: [name] Mobile Phone: [phone] Relation: Daughter Secondary Emergency Contact: Lindi Adie Address: [address]          [place], [place] [address] Johnnette Litter of [place] Phone: [phone] Relation: Other  Code Status:  DNR  Goals of care: Advanced Directive information Advanced Directives 03/24/2018  Does Patient Have a Medical Advance Directive? Yes  Type of Advance Directive Out of facility DNR (pink MOST or yellow form)  Does patient want to make changes to medical advance directive? No - Patient declined  Copy of [place] in Chart? -  Would patient like information on creating a medical advance directive? No - Patient declined  Pre-existing out of facility DNR order (yellow form or pink MOST form) -     Chief Complaint  Patient presents with  . Medical Management of Chronic Issues    Routine [facility] visit and to assess for continued need for oxycodone.    HPI:  Pt is an 84 y.o. female seen today for medical management of chronic diseases and also to assess the need for continued use of oxycodone.  She is a short-term rehabilitation resident at [facility]. She has a PMH of skin cancer, central cord syndrome, HTN, arthritis, protein-calorie malnutrition, and tetraparesis. She was seen in her room today. Wound nurse has just changed her dressing on her bilateral heels pressure ulcers, L>R. She denies having any pain, 0/10.  She has been admitted to [facility] on 01/19/18 from a recent hospitalization due to a fall at home.  CT C/T/L -spine showed a T11 vertebral body fracture.  MRI C-spine  showed a C6-7 disc injury with ALL rupture and cord signal change at C5-6, MRI T-spine showed no cord compression.  She was taken to the OR on 11/29/2017 for a C5-6/C6-7 ACDF to decompress her spinal cord as well as stabilize her fracture.  She had DVT of right lower extremity, and proximal femoral vein for which she had inferior cava filter placement on 12/08/2017 per interventional radiology as well as on 01/16/2018 for right posterior tibial vein, right popliteal vein, right proximal profunda vein, right femoral vein, right peroneal vein as well as left lower extremity acute DVT thrombosis left common femoral, left proximal profunda, left femoral vein, left popliteal vein, left posterior tibial vein, left peroneal vein and was started on Xarelto on 01/16/2018.   Past Medical History:  Diagnosis Date  . Acute blood loss anemia   . Arthritis   . Cancer ([place])    skin  . Central cord syndrome at C6 level of cervical spinal cord ([place]) 11/29/2017  . Hypertension   . Protein-calorie malnutrition, severe ([place]) 01/24/2018  . S/P insertion of IVC (inferior vena caval) filter 01/24/2018  . 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[facility])    Past Surgical History:  Procedure Laterality Date  . ANTERIOR CERVICAL DECOMP/DISCECTOMY FUSION N/A 11/29/2017   Procedure: Cervical five-six, six-seven Anterior Cervical Decompression Fusion;  Surgeon: Judith Part, MD;  Location: [facility];  Service: Neurosurgery;  Laterality: N/A;  Cervical five-six, six-seven Anterior Cervical Decompression Fusion  . CATARACT EXTRACTION    . IR IVC FILTER PLMT / S&I /IMG GUID/MOD SED  12/08/2017    Allergies  Allergen Reactions  . Other Swelling  . Lisinopril-Hydrochlorothiazide Swelling    Swelling of the tongue  . Dairy Aid [Lactase] Swelling  . Lisinopril-Hydrochlorothiazide Swelling    Tongue swelling  . Pork-Derived Products Swelling    Outpatient Encounter Medications as of 03/24/2018  Medication Sig  . acetaminophen (TYLENOL) 325 MG  tablet Take 650 mg by mouth every 6 (six) hours.   . Amino Acids-Protein Hydrolys (FEEDING SUPPLEMENT, PRO-STAT SUGAR FREE 64,) LIQD Take 30 mLs by mouth 2 (two) times daily.  Marland Kitchen amLODipine (NORVASC) 10 MG tablet Take 10 mg by mouth daily after supper.  . bisacodyl (DULCOLAX) 10 MG suppository Place 10 mg rectally daily as needed for moderate constipation.  . calcium carbonate (TUMS EX) 750 MG chewable tablet Chew 1 tablet by mouth 3 (three) times daily.  . capsaicin (ZOSTRIX) 0.025 % cream Apply topically 2 (two) times daily.  . collagenase (SANTYL) ointment Apply nickel thick amount topically once daily.  Clean left heel wound with normal saline, skin prep periwound, apply santy nickel thick to wound bed and normal saline gauze, cover with allevyn dressing.  . DULoxetine (CYMBALTA) 60 MG capsule Take 60 mg by mouth daily.   Marland Kitchen gabapentin (NEURONTIN) 400 MG capsule Take 400 mg by mouth every 6 (six) hours.  . Glycerin, Adult, 2.1 g SUPP Place 1 suppository rectally daily at 6 (six) AM.  . hydrocortisone (ANUSOL-HC) 2.5 % rectal cream Place 1 application rectally 2 (two) times daily.  Marland Kitchen loratadine (CLARITIN) 10 MG tablet Take 10 mg by mouth daily.  . Multiple Vitamin (MULTIVITAMIN WITH MINERALS) TABS tablet Take 1 tablet by mouth daily.  . NON FORMULARY Diet Type:  Regular - NO BEEF, PORK OR DAIRY PRODUCTS  . nortriptyline (PAMELOR) 25 MG capsule Take 25 mg by mouth at bedtime.  Oneta Rack Supplies (SKIN PREP WIPES) MISC Apply to both heals daily  . Probiotic Product (RISA-BID PROBIOTIC) TABS Take 1 tablet by mouth daily.  . rivaroxaban (XARELTO) 20 MG TABS tablet Take 1 tablet (20 mg total) by mouth daily with supper.  . Skin Protectants, Misc. (ENDIT EX) Apply topically to bottom / peri-area after preforming peri-care  . spironolactone (ALDACTONE) 25 MG tablet Take 25 mg by mouth 2 (two) times daily.   . tamsulosin (FLOMAX) 0.4 MG CAPS capsule Take 1 capsule (0.4 mg total) by mouth daily.  .  tizanidine (ZANAFLEX) 2 MG capsule Take 2 mg by mouth every 8 (eight) hours.  . [DISCONTINUED] oxyCODONE (OXY IR/ROXICODONE) 5 MG immediate release tablet Take 0.5 tablets (2.5 mg total) by mouth every 12 (twelve) hours as needed for up to 7 days for moderate pain ((score 4 to 6)).   No facility-administered encounter medications on file as of 03/24/2018.     Review of Systems  GENERAL: No change in appetite, no fatigue, no weight changes, no fever, chills or weakness MOUTH and THROAT: Denies oral discomfort, gingival pain or bleeding from teeth or hoarseness   RESPIRATORY: no cough, SOB, DOE, wheezing, hemoptysis CARDIAC: No chest pain, or palpitations GI: No abdominal pain, diarrhea, constipation, heart burn, nausea or vomiting GU: Denies dysuria, frequency, hematuria, incontinence, or discharge NEUROLOGICAL: Denies dizziness, syncope, or headache PSYCHIATRIC: Denies feelings of depression or anxiety. No report of hallucinations, insomnia, paranoia, or agitation    Immunization History  Administered Date(s) Administered  . Tdap 11/29/2017   Pertinent  Health Maintenance Due  Topic Date Due  . INFLUENZA VACCINE  03/31/2018 (Originally 09/01/2017)  . PNA vac Low Risk Adult (  1 of 2 - PCV13) 03/31/2018 (Originally 09/03/2001)  . DEXA SCAN  Discontinued   Fall Risk  02/22/2018  Falls in the past year? Exclusion - non ambulatory  Comment hoyer lift    Vitals:   03/24/18 1236  BP: 125/75  Pulse: 89  Resp: 16  Temp: 97.7 F (36.5 C)  TempSrc: Oral  SpO2: 94%  Weight: 178 lb 3.2 oz (80.8 kg)  Height: 5\' 7"  (1.702 m)   Body mass index is 27.91 kg/m.  Physical Exam  GENERAL APPEARANCE: Well nourished. In no acute distress. Normal body habitus SKIN:  Bilateral heel ulcers with dressing MOUTH and THROAT: Lips are without lesions. Oral mucosa is moist and without lesions. Tongue is normal in shape, size, and color and without lesions NECK: supple, trachea midline, no neck  masses RESPIRATORY: Breathing is even & unlabored, BS CTAB CARDIAC: RRR, no murmur,no extra heart sounds, BLE trace edema GI: Abdomen soft, normal BS, no masses, no tenderness GU: Has foley catheter attached to urine bag NEUROLOGICAL: There is no tremor. Speech is clear. Has tetraparesis. Alert and oriented X 3. PSYCHIATRIC:  Affect and behavior are appropriate  Labs reviewed: Recent Labs    02/20/18 1653 03/03/18 0550 03/10/18 0530 03/13/18 0642  NA 138 140 137 138  K 3.6 4.2 3.7 3.9  CL 98 105 103 103  CO2 33* 30 29 28   GLUCOSE 96 86 93 87  BUN 12 14 15 16   CREATININE 0.40* 0.38* 0.31* 0.34*  CALCIUM 8.5* 8.7* 8.6* 8.8*  MG 1.9  --  1.9 1.9   Recent Labs    01/06/18 0502 02/10/18 0500 02/20/18 1653  AST 23 26 39  ALT 29 17 21   ALKPHOS 84 77 85  BILITOT 1.0 0.5 0.7  PROT 5.3* 4.9* 5.9*  ALBUMIN 2.3* 2.2* 2.8*   Recent Labs    12/08/17 0525  01/18/18 0553 01/30/18 0510 02/20/18 1653  WBC 8.6   < > 12.6* 11.8* 10.2  NEUTROABS 6.3  --   --  7.7 6.7  HGB 10.6*   < > 9.4* 9.7* 12.3  HCT 32.8*   < > 29.0* 31.3* 40.0  MCV 89.1   < > 83.8 89.2 90.9  PLT 277   < > 665* 661* 524*   < > = values in this interval not displayed.    Assessment/Plan  1. Central cord syndrome, sequela ([place]) -  S/P ACDF of C5-6/C6-7 on 11/29/2017 o decompress her spinal cord,   as well as stabilize her fracture, will continue tizanidine 2 mg 1 capsule every 8 hours for muscle spasm, PT and OT for therapeutic strengthening exercises  2. Benign essential HTN -Well-controlled, continue amlodipine 10 mg 1 tab daily and Aldactone 25 mg 1 tab twice a day  3. Neurogenic bladder -Continue Foley catheter attached to urine bag and Flomax 0.4 mg 1 capsule daily  4. Neuropathic pain -Continue nortriptyline 25 mg 1 capsule at bedtime, gabapentin 400 mg 1 capsule every 6 hours, Cymbalta DR 60 mg 1 capsule daily - oxyCODONE (OXY IR/ROXICODONE) 5 MG immediate release tablet; Take 0.5 tablets (2.5 mg  total) by mouth every 12 hours as needed for severe pain.  Dispense: 14 tablet; Refill: 0  5.  DVT of bilateral lower extremity  -Continue Xarelto 20 mg 1 tab daily   Family/ staff Communication: Discussed plan of care with resident.  Labs/tests ordered: None  Goals of care:   Short-term rehabilitation.   Durenda Age, NP [facility] [phone] (Monday-Friday 8:00 a.m. - 5:00 p.m.) [phone] (after hours)

## 2018-03-29 ENCOUNTER — Other Ambulatory Visit: Payer: Self-pay | Admitting: Adult Health

## 2018-03-29 DIAGNOSIS — M792 Neuralgia and neuritis, unspecified: Secondary | ICD-10-CM

## 2018-03-29 MED ORDER — OXYCODONE HCL 5 MG PO TABS: ORAL_TABLET | ORAL | 0 refills | Status: DC

## 2018-03-29 NOTE — Telephone Encounter (Signed)
Refill request pended and forwarded to Durenda Age, NP for approval and transmittal to [facility].

## 2018-03-31 ENCOUNTER — Non-Acute Institutional Stay (SKILLED_NURSING_FACILITY): Payer: Medicare HMO | Admitting: Adult Health

## 2018-03-31 ENCOUNTER — Encounter: Payer: Self-pay | Admitting: Adult Health

## 2018-03-31 DIAGNOSIS — S14129S Central cord syndrome at unspecified level of cervical spinal cord, sequela: Secondary | ICD-10-CM | POA: Diagnosis not present

## 2018-03-31 DIAGNOSIS — N319 Neuromuscular dysfunction of bladder, unspecified: Secondary | ICD-10-CM | POA: Diagnosis not present

## 2018-03-31 DIAGNOSIS — M792 Neuralgia and neuritis, unspecified: Secondary | ICD-10-CM | POA: Diagnosis not present

## 2018-03-31 NOTE — Progress Notes (Signed)
[facility]:  [facility] Number: [id number] Place of Service:  SNF (31) Provider:  Durenda Age, NP  Patient Care Team: Patient, No Pcp Per as PCP - General (General Practice) Glendon Axe, MD (Internal Medicine)  Extended Emergency Contact Information Primary Emergency Contact: [name] Mobile Phone: [phone] Relation: Daughter Secondary Emergency Contact: Lindi Adie Address: [address]          [place], [place] [address] Johnnette Litter of [place] Phone: [phone] Relation: Other  Code Status:  DNR  Goals of care: Advanced Directive information Advanced Directives 03/31/2018  Does Patient Have a Medical Advance Directive? Yes  Type of Advance Directive Out of facility DNR (pink MOST or yellow form)  Does patient want to make changes to medical advance directive? No - Patient declined  Copy of [place] in Chart? -  Would patient like information on creating a medical advance directive? No - Patient declined  Pre-existing out of facility DNR order (yellow form or pink MOST form) -     Chief Complaint  Patient presents with  . Acute Visit    Patient is seen for pain management to assess the need for continued oxycodone.    HPI:  Pt is an 84 y.o. female seen today for an acute visit for pain management.  She is a short-term rehabilitation resident of [facility].  She has a PMH of skin cancer, central cord syndrome, HTN, severe protein-calorie malnutrition, and tetraparesis. She was seen today after her PT and OT. She verbalized that she continues to have pain on her bilateral fingertips, 6/10.  Her hands are noted to be cold to touch.  She has been admitted to [facility] on 01/19/2018 from a recent hospitalization due to a fall at home. CT of C/T/L-spine showed a T11 vertebral body fracture. MRI C-spine showed a C6-7 disc injury with ALL rupture and cord signal change at C5-6, MRI  T-spine showed no cord compression.  She was taken to the OR on 11/29/2017 for a C5-6/C6-7 ACDF to decompress her spinal cord as well as stabilize her fracture.  She had DVT of right lower extremity and proximal femoral vein for which she had inferior vena cava filter placement on 12/08/2017 per interventional radiology as well as on 01/16/2018 for right posterior tibial vein, right popliteal vein, right proximal profunda vein, right femoral vein, right peroneal vein as well as left lower extremity acute DVT thrombosis of left common femoral, left proximal profunda, left femoral vein, left popliteal vein, left posterior tibial vein, left peroneal vein and was started on Xarelto on 01/16/2018.   Past Medical History:  Diagnosis Date  . Acute blood loss anemia   . Arthritis   . Cancer ([place])    skin  . Central cord syndrome at C6 level of cervical spinal cord ([place]) 11/29/2017  . Hypertension   . Protein-calorie malnutrition, severe ([place]) 01/24/2018  . S/P insertion of IVC (inferior vena caval) filter 01/24/2018  . 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[facility])    Past Surgical History:  Procedure Laterality Date  . ANTERIOR CERVICAL DECOMP/DISCECTOMY FUSION N/A 11/29/2017   Procedure: Cervical five-six, six-seven Anterior Cervical Decompression Fusion;  Surgeon: Judith Part, MD;  Location: [facility];  Service: Neurosurgery;  Laterality: N/A;  Cervical five-six, six-seven Anterior Cervical Decompression Fusion  . CATARACT EXTRACTION    . IR IVC FILTER PLMT / S&I /IMG GUID/MOD SED  12/08/2017    Allergies  Allergen Reactions  . Other Swelling  .  Lisinopril-Hydrochlorothiazide Swelling    Swelling of the tongue  . Dairy Aid [Lactase] Swelling  . Lisinopril-Hydrochlorothiazide Swelling    Tongue swelling  . Pork-Derived Products Swelling    Outpatient Encounter Medications as of 03/31/2018  Medication Sig  . acetaminophen (TYLENOL) 325 MG tablet Take 650 mg by mouth every 6 (six) hours.   . Amino Acids-Protein  Hydrolys (FEEDING SUPPLEMENT, PRO-STAT SUGAR FREE 64,) LIQD Take 30 mLs by mouth 2 (two) times daily.  Marland Kitchen amLODipine (NORVASC) 10 MG tablet Take 10 mg by mouth daily after supper.  . bisacodyl (DULCOLAX) 10 MG suppository Place 10 mg rectally daily as needed for moderate constipation.  . calcium carbonate (TUMS EX) 750 MG chewable tablet Chew 1 tablet by mouth 3 (three) times daily.  . capsaicin (ZOSTRIX) 0.025 % cream Apply topically 2 (two) times daily.  . collagenase (SANTYL) ointment Apply nickel thick amount topically once daily.  Clean left heel wound with normal saline, skin prep periwound, apply santy nickel thick to wound bed and normal saline gauze, cover with allevyn dressing.  . DULoxetine (CYMBALTA) 60 MG capsule Take 60 mg by mouth daily.   Marland Kitchen gabapentin (NEURONTIN) 400 MG capsule Take 400 mg by mouth every 6 (six) hours.  . Glycerin, Adult, 2.1 g SUPP Place 1 suppository rectally daily at 6 (six) AM.  . hydrocortisone (ANUSOL-HC) 2.5 % rectal cream Place 1 application rectally 2 (two) times daily.  Marland Kitchen loratadine (CLARITIN) 10 MG tablet Take 10 mg by mouth daily.  . Multiple Vitamin (MULTIVITAMIN WITH MINERALS) TABS tablet Take 1 tablet by mouth daily.  . NON FORMULARY Diet Type:  Regular - NO BEEF, PORK OR DAIRY PRODUCTS  . nortriptyline (PAMELOR) 25 MG capsule Take 25 mg by mouth at bedtime.  Oneta Rack Supplies (SKIN PREP WIPES) MISC Apply to both heals daily  . oxyCODONE (OXY IR/ROXICODONE) 5 MG immediate release tablet Take 0.5 tablets (2.5 mg total) by mouth every 12 hours as needed for severe pain.  . Probiotic Product (RISA-BID PROBIOTIC) TABS Take 1 tablet by mouth daily.  . rivaroxaban (XARELTO) 20 MG TABS tablet Take 1 tablet (20 mg total) by mouth daily with supper.  . Skin Protectants, Misc. (ENDIT EX) Apply topically to bottom / peri-area after preforming peri-care  . spironolactone (ALDACTONE) 25 MG tablet Take 25 mg by mouth 2 (two) times daily.   . tamsulosin (FLOMAX)  0.4 MG CAPS capsule Take 1 capsule (0.4 mg total) by mouth daily.  . tizanidine (ZANAFLEX) 2 MG capsule Take 2 mg by mouth every 8 (eight) hours.   No facility-administered encounter medications on file as of 03/31/2018.     Review of Systems  GENERAL: No change in appetite, no fatigue, no weight changes, no fever, chills or weakness MOUTH and THROAT: Denies oral discomfort, gingival pain or bleeding RESPIRATORY: no cough, SOB, DOE, wheezing, hemoptysis CARDIAC: No chest pain, edema or palpitations GI: No abdominal pain, diarrhea, constipation, heart burn, nausea or vomiting NEUROLOGICAL: Denies dizziness, syncope, + numbness of fingertips PSYCHIATRIC: Denies feelings of depression or anxiety. No report of hallucinations, insomnia, paranoia, or agitation   Immunization History  Administered Date(s) Administered  . Tdap 11/29/2017   Pertinent  Health Maintenance Due  Topic Date Due  . INFLUENZA VACCINE  03/31/2018 (Originally 09/01/2017)  . PNA vac Low Risk Adult (1 of 2 - PCV13) 03/31/2018 (Originally 09/03/2001)  . DEXA SCAN  Discontinued   Fall Risk  02/22/2018  Falls in the past year? Exclusion - non ambulatory  Comment hoyer lift     Vitals:   03/31/18 1242  BP: 134/64  Pulse: 89  Resp: 16  Temp: 98.7 F (37.1 C)  TempSrc: Oral  SpO2: 96%  Weight: 180 lb 1.6 oz (81.7 kg)  Height: 5\' 7"  (1.702 m)   Body mass index is 28.21 kg/m.  Physical Exam  GENERAL APPEARANCE: Well nourished. In no acute distress. Normal body habitus SKIN:  Skin is warm and dry.  MOUTH and THROAT: Lips are without lesions. Oral mucosa is moist and without lesions. Tongue is normal in shape, size, and color and without lesions RESPIRATORY: Breathing is even & unlabored, BS CTAB CARDIAC: RRR, no murmur,no extra heart sounds, no edema GI: Abdomen soft, normal BS, no masses, no tenderness GU:  Has foley catheter attached to urine bag draining clear yellowish urine NEUROLOGICAL: There is no  tremor. Speech is clear. Alert and oriented X 3.  PSYCHIATRIC: Affect and behavior are appropriate   Labs reviewed: Recent Labs    02/20/18 1653 03/03/18 0550 03/10/18 0530 03/13/18 0642  NA 138 140 137 138  K 3.6 4.2 3.7 3.9  CL 98 105 103 103  CO2 33* 30 29 28   GLUCOSE 96 86 93 87  BUN 12 14 15 16   CREATININE 0.40* 0.38* 0.31* 0.34*  CALCIUM 8.5* 8.7* 8.6* 8.8*  MG 1.9  --  1.9 1.9   Recent Labs    01/06/18 0502 02/10/18 0500 02/20/18 1653  AST 23 26 39  ALT 29 17 21   ALKPHOS 84 77 85  BILITOT 1.0 0.5 0.7  PROT 5.3* 4.9* 5.9*  ALBUMIN 2.3* 2.2* 2.8*   Recent Labs    12/08/17 0525  01/18/18 0553 01/30/18 0510 02/20/18 1653  WBC 8.6   < > 12.6* 11.8* 10.2  NEUTROABS 6.3  --   --  7.7 6.7  HGB 10.6*   < > 9.4* 9.7* 12.3  HCT 32.8*   < > 29.0* 31.3* 40.0  MCV 89.1   < > 83.8 89.2 90.9  PLT 277   < > 665* 661* 524*   < > = values in this interval not displayed.     Assessment/Plan  1. Neuropathic pain -Has 6/10 pain on her fingertips, will continue gabapentin 400 mg every 6 hours, Cymbalta 60 mg 1 capsule daily, nortriptyline 25 mg 1 capsule at bedtime  2. Neurogenic bladder -Continue Foley catheter, Flomax 0.4 mg 1 capsule daily  3. Central cord syndrome, sequela ([facility]) -S/P ACDF of C5-6/C6-7 on 11/29/2017 to decompress her spinal cord, as well as, stabilize her fracture, will continue oxycodone 5 mg 1/2 tab= 2.5 mg every 12 hours as needed and acetaminophen 325 mg 2 tabs = 650 mg every 6 hours for pain, tizanidine 2 mg 1 capsule every 8 hours for muscle spasm   Family/ staff Communication: Discussed plan of care with resident.  Labs/tests ordered:  None  Goals of care:   Short-term care.   Durenda Age, NP [facility] [phone] (Monday-Friday 8:00 a.m. - 5:00 p.m.) [phone] (after hours)

## 2018-04-03 ENCOUNTER — Encounter
Admission: RE | Admit: 2018-04-03 | Discharge: 2018-04-03 | Disposition: A | Discharge status: Home / Self Care | Payer: Medicare HMO | Source: Ambulatory Visit | Attending: Internal Medicine | Admitting: Internal Medicine

## 2018-04-06 ENCOUNTER — Other Ambulatory Visit
Admission: RE | Admit: 2018-04-06 | Discharge: 2018-04-06 | Disposition: A | Discharge status: Home / Self Care | Payer: Medicare HMO | Source: Ambulatory Visit | Attending: Adult Health | Admitting: Adult Health

## 2018-04-06 DIAGNOSIS — L8990 Pressure ulcer of unspecified site, unspecified stage: Secondary | ICD-10-CM | POA: Diagnosis present

## 2018-04-06 LAB — PROTEIN, TOTAL: Total Protein: 5.5 g/dL — ABNORMAL LOW (ref 6.5–8.1)

## 2018-04-06 LAB — ALBUMIN: Albumin: 2.9 g/dL — ABNORMAL LOW (ref 3.5–5.0)

## 2018-04-12 ENCOUNTER — Other Ambulatory Visit: Payer: Self-pay

## 2018-04-12 ENCOUNTER — Ambulatory Visit: Payer: Medicare HMO | Admitting: Urology

## 2018-04-12 ENCOUNTER — Encounter: Payer: Self-pay | Admitting: Urology

## 2018-04-12 DIAGNOSIS — N319 Neuromuscular dysfunction of bladder, unspecified: Secondary | ICD-10-CM

## 2018-04-12 NOTE — Patient Instructions (Signed)
Indwelling Urinary Catheter Care, Adult  An indwelling urinary catheter is a thin tube that is put into your bladder. The tube helps to drain pee (urine) out of your body. The tube goes in through your urethra. Your urethra is where pee comes out of your body. Your pee will come out through the catheter, then it will go into a bag (drainage bag).  Take good care of your catheter so it will work well.  How to wear your catheter and bag  Supplies needed   Sticky tape (adhesive tape) or a leg strap.   Alcohol wipe or soap and water (if you use tape).   A clean towel (if you use tape).   Large overnight bag.   Smaller bag (leg bag).  Wearing your catheter  Attach your catheter to your leg with tape or a leg strap.   Make sure the catheter is not pulled tight.   If a leg strap gets wet, take it off and put on a dry strap.   If you use tape to hold the bag on your leg:  1. Use an alcohol wipe or soap and water to wash your skin where the tape made it sticky before.  2. Use a clean towel to pat-dry that skin.  3. Use new tape to make the bag stay on your leg.  Wearing your bags  You should have been given a large overnight bag.   You may wear the overnight bag in the day or night.   Always have the overnight bag lower than your bladder.  Do not let the bag touch the floor.   Before you go to sleep, put a clean plastic bag in a wastebasket. Then hang the overnight bag inside the wastebasket.  You should also have a smaller leg bag that fits under your clothes.   Always wear the leg bag below your knee.   Do not wear your leg bag at night.  How to care for your skin and catheter  Supplies needed   A clean washcloth.   Water and mild soap.   A clean towel.  Caring for your skin and catheter          Clean the skin around your catheter every day:  ? Wash your hands with soap and water.  ? Wet a clean washcloth in warm water and mild soap.  ? Clean the skin around your urethra.  ? If you are female:  ? Gently  spread the folds of skin around your vagina (labia).  ? With the washcloth in your other hand, wipe the inner side of your labia on each side. Wipe from front to back.  ? If you are female:  ? Pull back any skin that covers the end of your penis (foreskin).  ? With the washcloth in your other hand, wipe your penis in small circles. Start wiping at the tip of your penis, then move away from the catheter.  ? With your free hand, hold the catheter close to where it goes into your body.  ? Keep holding the catheter during cleaning so it does not get pulled out.  ? With the washcloth in your other hand, clean the catheter.  ? Only wipe downward on the catheter.  ? Do not wipe upward toward your body. Doing this may push germs into your urethra and cause infection.  ? Use a clean towel to pat-dry the catheter and the skin around it. Make sure to wipe off   all soap.  ? Wash your hands with soap and water.   Shower every day. Do not take baths.   Do not use cream, ointment, or lotion on the area where the catheter goes into your body, unless your doctor tells you to.   Do not use powders, sprays, or lotions on your genital area.   Check your skin around the catheter every day for signs of infection. Check for:  ? Redness, swelling, or pain.  ? Fluid or blood.  ? Warmth.  ? Pus or a bad smell.  How to empty the bag  Supplies needed   Rubbing alcohol.   Gauze pad or cotton ball.   Tape or a leg strap.  Emptying the bag  Pour the pee out of your bag when it is ?- full, or at least 2-3 times a day. Do this for your overnight bag and your leg bag.  1. Wash your hands with soap and water.  2. Separate (detach) the bag from your leg.  3. Hold the bag over the toilet or a clean pail. Keep the bag lower than your hips and bladder. This is so the pee (urine) does not go back into the tube.  4. Open the pour spout. It is at the bottom of the bag.  5. Empty the pee into the toilet or pail. Do not let the pour spout touch any  surface.  6. Put rubbing alcohol on a gauze pad or cotton ball.  7. Use the gauze pad or cotton ball to clean the pour spout.  8. Close the pour spout.  9. Attach the bag to your leg with tape or a leg strap.  10. Wash your hands with soap and water.  Follow instructions for cleaning the drainage bag:   From the product maker.   As told by your doctor.  How to change the bag  Supplies needed   Alcohol wipes.   A clean bag.   Tape or a leg strap.  Changing the bag  Replace your bag with a clean bag once a month. If it starts to leak, smell bad, or look dirty, change it sooner.  1. Wash your hands with soap and water.  2. Separate the dirty bag from your leg.  3. Pinch the catheter with your fingers so that pee does not spill out.  4. Separate the catheter tube from the bag tube where these tubes connect (at the connection valve). Do not let the tubes touch any surface.  5. Clean the end of the catheter tube with an alcohol wipe. Use a different alcohol wipe to clean the end of the bag tube.  6. Connect the catheter tube to the tube of the clean bag.  7. Attach the clean bag to your leg with tape or a leg strap. Do not make the bag tight on your leg.  8. Wash your hands with soap and water.  General rules     Never pull on your catheter. Never try to take it out. Doing that can hurt you.   Always wash your hands before and after you touch your catheter or bag. Use a mild, fragrance-free soap. If you do not have soap and water, use hand sanitizer.   Always make sure there are no twists or bends (kinks) in the catheter tube.   Always make sure there are no leaks in the catheter or bag.   Drink enough fluid to keep your pee pale yellow.   Do   not take baths, swim, or use a hot tub.   If you are female, wipe from front to back after you poop (have a bowel movement).  Contact a doctor if:   Your pee is cloudy.   Your pee smells worse than usual.   Your catheter gets clogged.   Your catheter leaks.   Your  bladder feels full.  Get help right away if:   You have redness, swelling, or pain where the catheter goes into your body.   You have fluid, blood, pus, or a bad smell coming from the area where the catheter goes into your body.   Your skin feels warm where the catheter goes into your body.   You have a fever.   You have pain in your:  ? Belly (abdomen).  ? Legs.  ? Lower back.  ? Bladder.   You see blood in the catheter.   Your pee is pink or red.   You feel sick to your stomach (nauseous).   You throw up (vomit).   You have chills.   Your pee is not draining into the bag.   Your catheter gets pulled out.  Summary   An indwelling urinary catheter is a thin tube that is placed into the bladder to help drain pee (urine) out of the body.   The catheter is placed into the part of the body that drains pee from the bladder (urethra).   Taking good care of your catheter will keep it working properly and help prevent problems.   Always wash your hands before and after touching your catheter or bag.   Never pull on your catheter or try to take it out.  This information is not intended to replace advice given to you by your health care provider. Make sure you discuss any questions you have with your health care provider.  Document Released: 05/15/2012 Document Revised: 07/11/2017 Document Reviewed: 09/03/2016  Elsevier Interactive Patient Education  2019 [employer]

## 2018-04-12 NOTE — Progress Notes (Signed)
04/12/2018 2:31 PM   Sherry [name] [birth year] [id number]  Referring provider: Kirk Ruths, MD [facility] [place], [place] [address]  Chief Complaint  Patient presents with  . Urinary Retention    HPI:  Ms. Sherry [name] is an 84 year old female seen today for urinary retention likely neurogenic.  She was trying to get her cat inside October 2019 when she fell.  She suffered spinal cord fracture and underwent C5-C7 ACDF. CT A/P showed normal kidneys/bladder. Postoperatively she had urinary retention and was doing CIC. She leaked in between. Then she had bilateral DVT and lasix. CIC became too difficult. By one report had over thousand cc in her bladder. Since Jan 2020 she has been managed with Foley catheter.  She was quadraplegic but is gaining some function. She is now standing with assistance. Moving left leg, but still needs slide board and assistance to transfer. She has some sensation returning. She has regular BM but doesn't always know. She has BM in a diaper. She is not leaking around catheter. Foley working well.   Modifying factors: There are no other modifying factors  Associated signs and symptoms: There are no other associated signs and symptoms Aggravating and relieving factors: There are no other aggravating or relieving factors Severity: Moderate Duration: Persistent   PMH: Past Medical History:  Diagnosis Date  . Acute blood loss anemia   . Arthritis   . Cancer ([place])    skin  . Central cord syndrome at C6 level of cervical spinal cord ([place]) 11/29/2017  . Hypertension   . Protein-calorie malnutrition, severe ([place]) 01/24/2018  . S/P insertion of IVC (inferior vena caval) filter 01/24/2018  . 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[facility])     Surgical History: Past Surgical History:  Procedure Laterality Date  . ANTERIOR CERVICAL DECOMP/DISCECTOMY FUSION N/A 11/29/2017   Procedure: Cervical five-six, six-seven Anterior Cervical Decompression  Fusion;  Surgeon: Judith Part, MD;  Location: [facility];  Service: Neurosurgery;  Laterality: N/A;  Cervical five-six, six-seven Anterior Cervical Decompression Fusion  . CATARACT EXTRACTION    . IR IVC FILTER PLMT / S&I /IMG GUID/MOD SED  12/08/2017    Home Medications:  Allergies as of 04/12/2018      Reactions   Other Swelling   Lisinopril-hydrochlorothiazide Swelling   Swelling of the tongue   Dairy Aid [lactase] Swelling   Lisinopril-hydrochlorothiazide Swelling   Tongue swelling   Pork-derived Products Swelling      Medication List       Accurate as of April 12, 2018  2:31 PM. Always use your most recent med list.        acetaminophen 325 MG tablet Commonly known as:  TYLENOL Take 650 mg by mouth every 6 (six) hours.   amLODipine 10 MG tablet Commonly known as:  NORVASC Take 10 mg by mouth daily after supper.   bisacodyl 10 MG suppository Commonly known as:  DULCOLAX Place 10 mg rectally daily as needed for moderate constipation.   calcium carbonate 750 MG chewable tablet Commonly known as:  TUMS EX Chew 1 tablet by mouth 3 (three) times daily.   capsaicin 0.025 % cream Commonly known as:  ZOSTRIX Apply topically 2 (two) times daily.   collagenase ointment Commonly known as:  SANTYL Apply nickel thick amount topically once daily.  Clean left heel wound with normal saline, skin prep periwound, apply santy nickel thick to wound bed and normal saline gauze, cover with allevyn dressing.   DULoxetine 60 MG capsule  Commonly known as:  CYMBALTA Take 60 mg by mouth daily.   ENDIT EX Apply topically to bottom / peri-area after preforming peri-care   feeding supplement (PRO-STAT SUGAR FREE 64) Liqd Take 30 mLs by mouth 2 (two) times daily.   gabapentin 400 MG capsule Commonly known as:  NEURONTIN Take 400 mg by mouth every 6 (six) hours.   Glycerin (Adult) 2.1 g Supp Place 1 suppository rectally daily at 6 (six) AM.   hydrocortisone 2.5 % rectal cream  Commonly known as:  ANUSOL-HC Place 1 application rectally 2 (two) times daily.   loratadine 10 MG tablet Commonly known as:  CLARITIN Take 10 mg by mouth daily.   multivitamin with minerals Tabs tablet Take 1 tablet by mouth daily.   NON FORMULARY Diet Type:  Regular - NO BEEF, PORK OR DAIRY PRODUCTS   nortriptyline 25 MG capsule Commonly known as:  PAMELOR Take 25 mg by mouth at bedtime.   oxyCODONE 5 MG immediate release tablet Commonly known as:  Oxy IR/ROXICODONE Take 0.5 tablets (2.5 mg total) by mouth every 12 hours as needed for severe pain.   Risa-Bid Probiotic Tabs Take 1 tablet by mouth daily.   rivaroxaban 20 MG Tabs tablet Commonly known as:  XARELTO Take 1 tablet (20 mg total) by mouth daily with supper.   Skin Prep Wipes Misc Apply to both heals daily   spironolactone 25 MG tablet Commonly known as:  ALDACTONE Take 25 mg by mouth 2 (two) times daily.   tamsulosin 0.4 MG Caps capsule Commonly known as:  FLOMAX Take 1 capsule (0.4 mg total) by mouth daily.   tizanidine 2 MG capsule Commonly known as:  ZANAFLEX Take 2 mg by mouth every 8 (eight) hours.       Allergies:  Allergies  Allergen Reactions  . Other Swelling  . Lisinopril-Hydrochlorothiazide Swelling    Swelling of the tongue  . Dairy Aid [Lactase] Swelling  . Lisinopril-Hydrochlorothiazide Swelling    Tongue swelling  . Pork-Derived Products Swelling    Family History: Family History  Problem Relation Age of Onset  . Breast cancer Maternal Aunt 75    Social History:  reports that she has never smoked. She has never used smokeless tobacco. She reports that she does not drink alcohol or use drugs.  ROS: UROLOGY Frequent Urination?: No Hard to postpone urination?: No Burning/pain with urination?: No Get up at night to urinate?: No Leakage of urine?: No Urine stream starts and stops?: No Trouble starting stream?: No Do you have to strain to urinate?: No Blood in urine?: No  Urinary tract infection?: No Sexually transmitted disease?: No Injury to kidneys or bladder?: Yes Painful intercourse?: No Weak stream?: No Currently pregnant?: No Vaginal bleeding?: No Last menstrual period?: n  Gastrointestinal Nausea?: No Vomiting?: No Indigestion/heartburn?: No Diarrhea?: No Constipation?: No  Constitutional Fever: No Night sweats?: No Weight loss?: No Fatigue?: No  Skin Skin rash/lesions?: No Itching?: No  Eyes Blurred vision?: No Double vision?: No  Ears/Nose/Throat Sore throat?: No Sinus problems?: No  Hematologic/Lymphatic Swollen glands?: No Easy bruising?: No  Cardiovascular Leg swelling?: No Chest pain?: No  Respiratory Cough?: No Shortness of breath?: No  Endocrine Excessive thirst?: No  Musculoskeletal Back pain?: No Joint pain?: Yes  Neurological Headaches?: No Dizziness?: No  Psychologic Depression?: No Anxiety?: No  Physical Exam: There were no vitals taken for this visit.  Constitutional:  Alert and oriented, No acute distress. HEENT: [place] AT, moist mucus membranes.  Trachea midline, no masses.  Cardiovascular: No clubbing, cyanosis, or edema. Respiratory: Normal respiratory effort, no increased work of breathing. GI: Abdomen is soft, nontender, nondistended, no abdominal masses GU: No CVA tenderness Lymph: No cervical or inguinal lymphadenopathy. Skin: No rashes, bruises or suspicious lesions. Neurologic: Grossly intact, no focal deficits, moving all 4 extremities. Psychiatric: Normal mood and affect. GU: foley in place, urine clear   Laboratory Data: Lab Results  Component Value Date   WBC 10.2 02/20/2018   HGB 12.3 02/20/2018   HCT 40.0 02/20/2018   MCV 90.9 02/20/2018   PLT 524 (H) 02/20/2018    Lab Results  Component Value Date   CREATININE 0.34 (L) 03/13/2018    No results found for: PSA  No results found for: TESTOSTERONE  No results found for: HGBA1C  Urinalysis    Component Value  Date/Time   COLORURINE YELLOW (A) 02/10/2018 1242   APPEARANCEUR HAZY (A) 02/10/2018 1242   LABSPEC 1.006 02/10/2018 1242   PHURINE 6.0 02/10/2018 1242   GLUCOSEU NEGATIVE 02/10/2018 1242   HGBUR SMALL (A) 02/10/2018 1242   BILIRUBINUR NEGATIVE 02/10/2018 1242   KETONESUR NEGATIVE 02/10/2018 1242   PROTEINUR NEGATIVE 02/10/2018 1242   NITRITE POSITIVE (A) 02/10/2018 1242   LEUKOCYTESUR MODERATE (A) 02/10/2018 1242    Lab Results  Component Value Date   BACTERIA RARE (A) 02/10/2018    Pertinent Imaging: CT a/p  Results for orders placed during the hospital encounter of 12/07/17  DG Abd 1 View   Narrative CLINICAL DATA:  Abdominal distention  EXAM: ABDOMEN - 1 VIEW  COMPARISON:  None.  FINDINGS: Large stool burden throughout the colon. No evidence of bowel obstruction or free air. No organomegaly or suspicious calcification. IVC filter in place.  Left lower lobe atelectasis or infiltrate noted. Small left effusion.  IMPRESSION: Large stool burden.  No obstruction or free air.  Left lower lobe atelectasis or infiltrate with left effusion.   Electronically Signed   By: Rolm Baptise M.D.   On: 01/15/2018 18:38    No results found for this or any previous visit. No results found for this or any previous visit. No results found for this or any previous visit. No results found for this or any previous visit. No results found for this or any previous visit. No results found for this or any previous visit. No results found for this or any previous visit.  Assessment & Plan:    1. Neurogenic bladder -I had a long discussion with the patient and her daughter.  She cannot transfer yet to undergo urodynamics.  We went over in detail the nature risk and benefits of SP tube, foley, CIC, void trial.  She is gaining some sensory and motor function.  She is now able to stand with physical therapy.  We will give her 6 more weeks and then reassess.  Catheter should be changed  every 30 days or as needed.  She is on tamsulosin right now and can stop that.  Her daughter ask about bethanechol and we went over this medication.   No follow-ups on file.  Festus Aloe, MD  [facility] [address] [place], [place] [address] [phone]

## 2018-04-13 ENCOUNTER — Non-Acute Institutional Stay (SKILLED_NURSING_FACILITY): Payer: Medicare HMO | Admitting: Adult Health

## 2018-04-13 ENCOUNTER — Encounter: Payer: Self-pay | Admitting: Adult Health

## 2018-04-13 DIAGNOSIS — I82503 Chronic embolism and thrombosis of unspecified deep veins of lower extremity, bilateral: Secondary | ICD-10-CM

## 2018-04-13 DIAGNOSIS — M792 Neuralgia and neuritis, unspecified: Secondary | ICD-10-CM | POA: Diagnosis not present

## 2018-04-13 DIAGNOSIS — M153 Secondary multiple arthritis: Secondary | ICD-10-CM

## 2018-04-13 DIAGNOSIS — N319 Neuromuscular dysfunction of bladder, unspecified: Secondary | ICD-10-CM

## 2018-04-13 DIAGNOSIS — S14126S Central cord syndrome at C6 level of cervical spinal cord, sequela: Secondary | ICD-10-CM | POA: Diagnosis not present

## 2018-04-13 DIAGNOSIS — I110 Hypertensive heart disease with heart failure: Secondary | ICD-10-CM | POA: Diagnosis not present

## 2018-04-13 DIAGNOSIS — I5089 Other heart failure: Secondary | ICD-10-CM

## 2018-04-13 NOTE — Progress Notes (Signed)
[facility]  [facility] Number: [id number] Place of Service:  SNF (31) Provider:  Durenda Age, NP  Patient Care Team: Patient, No Pcp Per as PCP - General (General Practice) Glendon Axe, MD (Internal Medicine)  Extended Emergency Contact Information Primary Emergency Contact: [name] Mobile Phone: [phone] Relation: Daughter Secondary Emergency Contact: Lindi Adie Address: [address]          [place], [place] [address] Johnnette Litter of [place] Phone: [phone] Relation: Other  Code Status:  DNR  Goals of care: Advanced Directive information Advanced Directives 04/13/2018  Does Patient Have a Medical Advance Directive? Yes  Type of Advance Directive Out of facility DNR (pink MOST or yellow form)  Does patient want to make changes to medical advance directive? No - Patient declined  Copy of [place] in Chart? -  Would patient like information on creating a medical advance directive? No - Patient declined  Pre-existing out of facility DNR order (yellow form or pink MOST form) -     Chief Complaint  Patient presents with   Medical Management of Chronic Issues    Routine [facility] visit    HPI:  Pt is an 84 y.o. female seen today for medical management of chronic diseases.  She is a long-term resident of [facility].  She has a PMH of skin cancer, central cord syndrome, HTN, severe protein-calorie malnutrition, and tetraparesis. She was seen in the room today with daughter and sitter at bedside. She was noted to be moving all four extremities and was thrilled about it. She said that therapy has started walking her. She complained of left shoulder pain and requesting Capsaicin cream. She had gained 3 lbs in a day. No SOB noted. BLE continues to have 2+ edema.     Past Medical History:  Diagnosis Date   Acute blood loss anemia    Arthritis    Cancer ([place])    skin   Central  cord syndrome at C6 level of cervical spinal cord ([place]) 11/29/2017   Hypertension    Protein-calorie malnutrition, severe ([place]) 01/24/2018   S/P insertion of IVC (inferior vena caval) filter 01/24/2018   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([place])    Past Surgical History:  Procedure Laterality Date   ANTERIOR CERVICAL DECOMP/DISCECTOMY FUSION N/A 11/29/2017   Procedure: Cervical five-six, six-seven Anterior Cervical Decompression Fusion;  Surgeon: Judith Part, MD;  Location: [facility];  Service: Neurosurgery;  Laterality: N/A;  Cervical five-six, six-seven Anterior Cervical Decompression Fusion   CATARACT EXTRACTION     IR IVC FILTER PLMT / S&I /IMG GUID/MOD SED  12/08/2017    Allergies  Allergen Reactions   Other Swelling   Lisinopril-Hydrochlorothiazide Swelling    Swelling of the tongue   Dairy Aid [Lactase] Swelling   Lisinopril-Hydrochlorothiazide Swelling    Tongue swelling   Pork-Derived Products Swelling    Outpatient Encounter Medications as of 04/13/2018  Medication Sig   acetaminophen (TYLENOL) 325 MG tablet Take 650 mg by mouth every 6 (six) hours.    Amino Acids-Protein Hydrolys (FEEDING SUPPLEMENT, PRO-STAT SUGAR FREE 64,) LIQD Take 30 mLs by mouth 2 (two) times daily.    amLODipine (NORVASC) 10 MG tablet Take 10 mg by mouth daily after supper.    bisacodyl (DULCOLAX) 10 MG suppository Place 10 mg rectally daily as needed for moderate constipation.   calcium carbonate (TUMS EX) 750 MG chewable tablet Chew 1 tablet by mouth 3 (three) times daily.   capsaicin (  ZOSTRIX) 0.025 % cream Apply topically 2 (two) times daily.   collagenase (SANTYL) ointment Apply nickel thick amount topically once daily.  Clean left heel wound with normal saline, skin prep periwound, apply santy nickel thick to wound bed and normal saline gauze, cover with allevyn dressing.   DULoxetine (CYMBALTA) 60 MG capsule Take 60 mg by mouth daily.    gabapentin (NEURONTIN) 400 MG capsule Take 400 mg by  mouth every 6 (six) hours.   Glycerin, Adult, 2.1 g SUPP Place 1 suppository rectally daily at 6 (six) AM.   hydrocortisone (ANUSOL-HC) 2.5 % rectal cream Place 1 application rectally 2 (two) times daily.   loratadine (CLARITIN) 10 MG tablet Take 10 mg by mouth daily.    Multiple Vitamin (MULTIVITAMIN WITH MINERALS) TABS tablet Take 1 tablet by mouth daily.   NON FORMULARY Diet Type:  Regular - NO BEEF, PORK OR DAIRY PRODUCTS   nortriptyline (PAMELOR) 25 MG capsule Take 25 mg by mouth at bedtime.   Ostomy Supplies (SKIN PREP WIPES) MISC Apply to both heals daily   oxyCODONE (OXY IR/ROXICODONE) 5 MG immediate release tablet Take 0.5 tablets (2.5 mg total) by mouth every 12 hours as needed for severe pain.   Probiotic Product (RISA-BID PROBIOTIC) TABS Take 1 tablet by mouth daily.    rivaroxaban (XARELTO) 20 MG TABS tablet Take 1 tablet (20 mg total) by mouth daily with supper.   Skin Protectants, Misc. (ENDIT EX) Apply topically to bottom / peri-area after preforming peri-care   sodium chloride (OCEAN) 0.65 % SOLN nasal spray Place 2 sprays into both nostrils 3 (three) times daily as needed for congestion.   spironolactone (ALDACTONE) 25 MG tablet Take 25 mg by mouth 2 (two) times daily.    tamsulosin (FLOMAX) 0.4 MG CAPS capsule Take 1 capsule (0.4 mg total) by mouth daily.   tizanidine (ZANAFLEX) 2 MG capsule Take 2 mg by mouth every 8 (eight) hours.   No facility-administered encounter medications on file as of 04/13/2018.     Review of Systems  GENERAL: No change in appetite, no fatigue, no weight changes, no fever, chills or weakness MOUTH and THROAT: Denies oral discomfort, gingival pain or bleeding RESPIRATORY: no cough, SOB, DOE, wheezing, hemoptysis CARDIAC: No chest pain, or palpitations GI: No abdominal pain, diarrhea, constipation, heart burn, nausea or vomiting NEUROLOGICAL: Denies dizziness, syncope, numbness, or headache PSYCHIATRIC: Denies feelings of  depression or anxiety. No report of hallucinations, insomnia, paranoia, or agitation    Immunization History  Administered Date(s) Administered   Tdap 11/29/2017   Pertinent  Health Maintenance Due  Topic Date Due   INFLUENZA VACCINE  11/02/2018 (Originally 09/01/2017)   PNA vac Low Risk Adult (1 of 2 - PCV13) 04/13/2019 (Originally 09/03/2001)   DEXA SCAN  Discontinued   Fall Risk  02/22/2018  Falls in the past year? Exclusion - non ambulatory  Comment hoyer lift     Vitals:   04/13/18 1118  BP: 118/75  Pulse: 86  Resp: 20  Temp: 97.6 F (36.4 C)  TempSrc: Oral  SpO2: 98%  Weight: 171 lb 4.8 oz (77.7 kg)  Height: 5\' 7"  (1.702 m)   Body mass index is 26.83 kg/m.  Physical Exam  GENERAL APPEARANCE: Well nourished. In no acute distress. Normal body habitus SKIN:  Bilateral heels with ulcer which is dry  MOUTH and THROAT: Lips are without lesions. Oral mucosa is moist and without lesions. Tongue is normal in shape, size, and color and without lesions RESPIRATORY: Breathing is  even & unlabored, BS CTAB CARDIAC: RRR, no murmur,no extra heart sounds, BLE 2+ edema GI: Abdomen soft, normal BS, no masses, no tenderness EXTREMITIES:  Able to move X 4 extremities but continues to have weakness on BLE NEUROLOGICAL: There is no tremor. Speech is clear. Alert and oriented X 3. PSYCHIATRIC:  Affect and behavior are appropriate  Labs reviewed: Recent Labs    02/20/18 1653 03/03/18 0550 03/10/18 0530 03/13/18 0642  NA 138 140 137 138  K 3.6 4.2 3.7 3.9  CL 98 105 103 103  CO2 33* 30 29 28   GLUCOSE 96 86 93 87  BUN 12 14 15 16   CREATININE 0.40* 0.38* 0.31* 0.34*  CALCIUM 8.5* 8.7* 8.6* 8.8*  MG 1.9  --  1.9 1.9   Recent Labs    01/06/18 0502 02/10/18 0500 02/20/18 1653 04/06/18 0615  AST 23 26 39  --   ALT 29 17 21   --   ALKPHOS 84 77 85  --   BILITOT 1.0 0.5 0.7  --   PROT 5.3* 4.9* 5.9* 5.5*  ALBUMIN 2.3* 2.2* 2.8* 2.9*   Recent Labs    12/08/17 0525   01/18/18 0553 01/30/18 0510 02/20/18 1653  WBC 8.6   < > 12.6* 11.8* 10.2  NEUTROABS 6.3  --   --  7.7 6.7  HGB 10.6*   < > 9.4* 9.7* 12.3  HCT 32.8*   < > 29.0* 31.3* 40.0  MCV 89.1   < > 83.8 89.2 90.9  PLT 277   < > 665* 661* 524*   < > = values in this interval not displayed.    Assessment/Plan  1. Central cord syndrome at C6 level of cervical spinal cord, sequela (HCC) -S/P ACDF of C5-6/C6-7 on 11/29/17 to decompress her spinal cord, as well as stabilize her fracture, she continues to have PT and OT for therapeutic strengthening exercises, continue tizanidine 2 mg 1 capsule every 8 hours for muscle spasm  2. Hypertensive heart disease with other congestive heart failure (HCC) -BP is well controlled, she had gain 3 pounds in a day, continues to have bilateral lower extremity 2+ edema, will give extra dose of Spironolactone 25 mg in addition to twice a day dosage, will continue amlodipine 10 mg 1 tab daily, check BMP  3. Chronic deep vein thrombosis (DVT) of both lower extremities, unspecified vein (HCC) -Continue Xarelto 20 mg daily, will check CBC  4. Neuropathic pain -She continues to use her oxycodone 2.5 mg every 12 hours PRN (8X in two weeks), continue gabapentin 400 mg 1 capsule every 6 hours, nortriptyline 25 mg 1 capsule at bedtime  5. Neurogenic bladder - will continue Flomax 0.4 mg 1 capsule daily, indwelling Foley catheter, catheter care daily  6.  Posttraumatic osteoarthritis of multiple joints - will continue Capsaicin cream 0.025% topically to left shoulder, bilateral hands and knees twice a day   Family/ staff Communication: Discussed plan of care with resident.  Labs/tests ordered:  BMP and CBC  Goals of care:   Long-term care.   Durenda Age, NP [facility] Adult [facility] [phone] (Monday-Friday 8:00 a.m. - 5:00 p.m.) [phone] (after hours)

## 2018-04-14 ENCOUNTER — Other Ambulatory Visit
Admission: RE | Admit: 2018-04-14 | Discharge: 2018-04-14 | Disposition: A | Discharge status: Home / Self Care | Payer: Medicare HMO | Source: Ambulatory Visit | Attending: Adult Health | Admitting: Adult Health

## 2018-04-14 DIAGNOSIS — Z7901 Long term (current) use of anticoagulants: Secondary | ICD-10-CM | POA: Diagnosis not present

## 2018-04-14 DIAGNOSIS — I509 Heart failure, unspecified: Secondary | ICD-10-CM | POA: Diagnosis present

## 2018-04-14 LAB — BASIC METABOLIC PANEL
ANION GAP: 10 (ref 5–15)
BUN: 15 mg/dL (ref 8–23)
CHLORIDE: 100 mmol/L (ref 98–111)
CO2: 27 mmol/L (ref 22–32)
Calcium: 9 mg/dL (ref 8.9–10.3)
Creatinine, Ser: 0.41 mg/dL — ABNORMAL LOW (ref 0.44–1.00)
GFR calc Af Amer: >60 mL/min (ref >60)
GFR calc non Af Amer: >60 mL/min (ref >60)
Glucose, Bld: 88 mg/dL (ref 70–99)
Potassium: 4 mmol/L (ref 3.5–5.1)
Sodium: 137 mmol/L (ref 135–145)

## 2018-04-14 LAB — CBC WITH DIFFERENTIAL/PLATELET
Abs Immature Granulocytes: 0.02 10*3/uL (ref 0.00–0.07)
Basophils Absolute: 0 10*3/uL (ref 0.0–0.1)
Basophils Relative: 1 %
Eosinophils Absolute: 0.4 10*3/uL (ref 0.0–0.5)
Eosinophils Relative: 6 %
HCT: 34.2 % — ABNORMAL LOW (ref 36.0–46.0)
Hemoglobin: 11.1 g/dL — ABNORMAL LOW (ref 12.0–15.0)
IMMATURE GRANULOCYTES: 0 %
LYMPHS PCT: 40 %
Lymphs Abs: 2.6 10*3/uL (ref 0.7–4.0)
MCH: 29 pg (ref 26.0–34.0)
MCHC: 32.5 g/dL (ref 30.0–36.0)
MCV: 89.3 fL (ref 80.0–100.0)
Monocytes Absolute: 0.4 10*3/uL (ref 0.1–1.0)
Monocytes Relative: 7 %
Neutro Abs: 3 10*3/uL (ref 1.7–7.7)
Neutrophils Relative %: 46 %
Platelets: 410 10*3/uL — ABNORMAL HIGH (ref 150–400)
RBC: 3.83 MIL/uL — ABNORMAL LOW (ref 3.87–5.11)
RDW: 15.8 % — ABNORMAL HIGH (ref 11.5–15.5)
WBC: 6.4 10*3/uL (ref 4.0–10.5)
nRBC: 0 % (ref 0.0–0.2)

## 2018-04-18 ENCOUNTER — Encounter: Payer: Self-pay | Admitting: Adult Health

## 2018-04-18 ENCOUNTER — Non-Acute Institutional Stay (SKILLED_NURSING_FACILITY): Payer: Medicare HMO | Admitting: Adult Health

## 2018-04-18 DIAGNOSIS — S14129S Central cord syndrome at unspecified level of cervical spinal cord, sequela: Secondary | ICD-10-CM

## 2018-04-18 DIAGNOSIS — N319 Neuromuscular dysfunction of bladder, unspecified: Secondary | ICD-10-CM | POA: Diagnosis not present

## 2018-04-18 NOTE — Progress Notes (Signed)
[facility] Service:  SNF (31) Provider:  Durenda Age, NP  Patient Care Team: Patient, No Pcp Per as PCP - General (General Practice) Glendon Axe, MD (Internal Medicine)  Extended Emergency Contact Information Primary Emergency Contact: [name] Mobile Phone: [phone] Relation: Daughter Secondary Emergency Contact: Lindi Adie Address: [address]          [place], [place] [address] Johnnette Litter of [place] Phone: [phone] Relation: Other  Code Status:  DNR  Goals of care: Advanced Directive information Advanced Directives 04/13/2018  Does Patient Have a Medical Advance Directive? -  Type of Advance Directive -  Does patient want to make changes to medical advance directive? -  Copy of [place] in Chart? -  Would patient like information on creating a medical advance directive? No - Patient declined  Pre-existing out of facility DNR order (yellow form or pink MOST form) -     Chief Complaint  Patient presents with  . Acute Visit    Neurogenic bladder concerns    HPI:  Pt is an 84 y.o. female seen today for an acute visit due to neurogenic bladder concerns. She is a long-term care resident of [facility]. She has a PMH of skin cancer, central cord syndrome, hypertension, severe protein-calorie malnutrition, and tetraparesis. She was seen today in her room. She was happy about her progress in therapy. She verbalized that they got her up today in therapy. She requested that her catheter be changed every 2 weeks. Explained to her that her catheter can be changed monthly or at anytime it gets dislodged or clogged. She agreed. She has seen the urologist last week, 04/12/18, and Flomax was recommended to be stopped. However, Flomax was still noted at the medication list and nursing supervisor noted it.   Past Medical History:  Diagnosis Date  . Acute blood loss anemia   . Arthritis    . Cancer ([place])    skin  . Central cord syndrome at C6 level of cervical spinal cord ([place]) 11/29/2017  . Hypertension   . Protein-calorie malnutrition, severe ([place]) 01/24/2018  . S/P insertion of IVC (inferior vena caval) filter 01/24/2018  . 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[facility])    Past Surgical History:  Procedure Laterality Date  . ANTERIOR CERVICAL DECOMP/DISCECTOMY FUSION N/A 11/29/2017   Procedure: Cervical five-six, six-seven Anterior Cervical Decompression Fusion;  Surgeon: Judith Part, MD;  Location: [facility];  Service: Neurosurgery;  Laterality: N/A;  Cervical five-six, six-seven Anterior Cervical Decompression Fusion  . CATARACT EXTRACTION    . IR IVC FILTER PLMT / S&I /IMG GUID/MOD SED  12/08/2017    Allergies  Allergen Reactions  . Other Swelling  . Lisinopril-Hydrochlorothiazide Swelling    Swelling of the tongue  . Dairy Aid [Lactase] Swelling  . Lisinopril-Hydrochlorothiazide Swelling    Tongue swelling  . Pork-Derived Products Swelling    Outpatient Encounter Medications as of 04/18/2018  Medication Sig  . acetaminophen (TYLENOL) 325 MG tablet Take 650 mg by mouth every 6 (six) hours.   . Amino Acids-Protein Hydrolys (FEEDING SUPPLEMENT, PRO-STAT SUGAR FREE 64,) LIQD Take 30 mLs by mouth 2 (two) times daily.   Marland Kitchen amLODipine (NORVASC) 10 MG tablet Take 10 mg by mouth daily after supper.   . bisacodyl (DULCOLAX) 10 MG suppository Place 10 mg rectally daily as needed for moderate constipation.  . calcium carbonate (TUMS EX) 750 MG chewable tablet Chew 1 tablet by mouth 3 (three) times  daily.  . Capsaicin 0.1 % CREA Apply 1 application topically 2 (two) times daily. Apply to left shoulder, bilateral hands, and knees  . collagenase (SANTYL) ointment Apply nickel thick amount topically once daily.  Clean left heel wound with normal saline, skin prep periwound, apply santy nickel thick to wound bed and normal saline gauze, cover with allevyn dressing.  . DULoxetine (CYMBALTA) 60 MG  capsule Take 60 mg by mouth daily.   Marland Kitchen gabapentin (NEURONTIN) 400 MG capsule Take 400 mg by mouth every 6 (six) hours.  . Glycerin, Adult, 2.1 g SUPP Place 1 suppository rectally daily at 6 (six) AM.  . loratadine (CLARITIN) 10 MG tablet Take 10 mg by mouth daily.   . Multiple Vitamin (MULTIVITAMIN WITH MINERALS) TABS tablet Take 1 tablet by mouth daily.  . NON FORMULARY Diet Type:  Regular - NO BEEF, PORK OR DAIRY PRODUCTS  . nortriptyline (PAMELOR) 25 MG capsule Take 25 mg by mouth at bedtime.  Oneta Rack Supplies (SKIN PREP WIPES) MISC Apply to both heals daily  . oxyCODONE (OXY IR/ROXICODONE) 5 MG immediate release tablet Take 0.5 tablets (2.5 mg total) by mouth every 12 hours as needed for severe pain.  . Probiotic Product (RISA-BID PROBIOTIC) TABS Take 1 tablet by mouth daily.   . rivaroxaban (XARELTO) 20 MG TABS tablet Take 1 tablet (20 mg total) by mouth daily with supper.  . Skin Protectants, Misc. (ENDIT EX) Apply topically to bottom / peri-area after preforming peri-care  . sodium chloride (OCEAN) 0.65 % SOLN nasal spray Place 2 sprays into both nostrils 3 (three) times daily as needed for congestion.  Marland Kitchen spironolactone (ALDACTONE) 25 MG tablet Take 25 mg by mouth 2 (two) times daily.   . tizanidine (ZANAFLEX) 2 MG capsule Take 2 mg by mouth every 8 (eight) hours.   . [DISCONTINUED] capsaicin (ZOSTRIX) 0.025 % cream Apply topically 2 (two) times daily.  . [DISCONTINUED] hydrocortisone (ANUSOL-HC) 2.5 % rectal cream Place 1 application rectally 2 (two) times daily.  . [DISCONTINUED] tamsulosin (FLOMAX) 0.4 MG CAPS capsule Take 1 capsule (0.4 mg total) by mouth daily.   No facility-administered encounter medications on file as of 04/18/2018.     Review of Systems  GENERAL: No change in appetite, no fatigue, no weight changes, no fever, chills  MOUTH and THROAT: Denies oral discomfort, gingival pain or bleeding RESPIRATORY: no cough, SOB, DOE, wheezing, hemoptysis CARDIAC: No chest  pain, edema or palpitations GI: No abdominal pain, diarrhea, constipation, heart burn, nausea or vomiting GU:  Has foley catheter attached to urine bag NEUROLOGICAL: +numbness of extremities PSYCHIATRIC: Denies feelings of depression or anxiety. No report of hallucinations, insomnia, paranoia, or agitation   Immunization History  Administered Date(s) Administered  . Tdap 11/29/2017   Pertinent  Health Maintenance Due  Topic Date Due  . INFLUENZA VACCINE  11/02/2018 (Originally 09/01/2017)  . PNA vac Low Risk Adult (1 of 2 - PCV13) 04/13/2019 (Originally 09/03/2001)  . DEXA SCAN  Discontinued   Fall Risk  02/22/2018  Falls in the past year? Exclusion - non ambulatory  Comment hoyer lift     Vitals:   04/18/18 1141  BP: 118/75  Pulse: 86  Resp: 20  Temp: 97.6 F (36.4 C)  TempSrc: Oral  SpO2: 98%  Weight: 166 lb 6.4 oz (75.5 kg)  Height: 5\' 7"  (5.361 m)   Body mass index is 26.06 kg/m.  Physical Exam  GENERAL APPEARANCE: Well nourished. In no acute distress. Normal body habitus MOUTH and THROAT:  Lips are without lesions. Oral mucosa is moist and without lesions.  RESPIRATORY: Breathing is even & unlabored, BS CTAB CARDIAC: RRR, no murmur,no extra heart sounds, no edema GI: Abdomen soft, normal BS, no masses, no tenderness EXTREMITIES:  Able to move X 4 extremities but continues to have BLE weakness NEUROLOGICAL: There is no tremor. Speech is clear. Alert and oriented X 3. PSYCHIATRIC:  Affect and behavior are appropriate   Labs reviewed: Recent Labs    02/20/18 1653  03/10/18 0530 03/13/18 0642 04/14/18 0610  NA 138   < > 137 138 137  K 3.6   < > 3.7 3.9 4.0  CL 98   < > 103 103 100  CO2 33*   < > 29 28 27   GLUCOSE 96   < > 93 87 88  BUN 12   < > 15 16 15   CREATININE 0.40*   < > 0.31* 0.34* 0.41*  CALCIUM 8.5*   < > 8.6* 8.8* 9.0  MG 1.9  --  1.9 1.9  --    < > = values in this interval not displayed.   Recent Labs    01/06/18 0502 02/10/18 0500  02/20/18 1653 04/06/18 0615  AST 23 26 39  --   ALT 29 17 21   --   ALKPHOS 84 77 85  --   BILITOT 1.0 0.5 0.7  --   PROT 5.3* 4.9* 5.9* 5.5*  ALBUMIN 2.3* 2.2* 2.8* 2.9*   Recent Labs    01/30/18 0510 02/20/18 1653 04/14/18 0610  WBC 11.8* 10.2 6.4  NEUTROABS 7.7 6.7 3.0  HGB 9.7* 12.3 11.1*  HCT 31.3* 40.0 34.2*  MCV 89.2 90.9 89.3  PLT 661* 524* 410*    Assessment/Plan  1. Neurogenic bladder - will discontinue Flomax per urology recommendation, foley catheter change monthly and PRN, catheter care will need to be done daily  2. Central cord syndrome at C6 level of cervical spinal cord, sequela (HCC) - S/P ACDF of C5-6/C6-7 on 11/29/17, continue PT and OT strengthening exercises, fall precautions    Family/ staff Communication: Discussed plan of care with resident.  Labs/tests ordered:  None  Goals of care:   Long-term care.   Durenda Age, NP [facility] and Adult Medicine [phone] (Monday-Friday 8:00 a.m. - 5:00 p.m.) [phone] (after hours)

## 2018-04-19 ENCOUNTER — Encounter: Payer: Medicare HMO | Admitting: Physical Medicine & Rehabilitation

## 2018-04-26 ENCOUNTER — Ambulatory Visit: Payer: Medicare HMO | Admitting: Physical Medicine & Rehabilitation

## 2018-04-27 ENCOUNTER — Non-Acute Institutional Stay (SKILLED_NURSING_FACILITY): Payer: Medicare HMO | Admitting: Adult Health

## 2018-04-27 ENCOUNTER — Encounter: Payer: Self-pay | Admitting: Adult Health

## 2018-04-27 DIAGNOSIS — E8809 Other disorders of plasma-protein metabolism, not elsewhere classified: Secondary | ICD-10-CM

## 2018-04-27 DIAGNOSIS — R634 Abnormal weight loss: Secondary | ICD-10-CM

## 2018-04-27 NOTE — Progress Notes (Signed)
Location:  [facility] Number: [id number] Place of Service:  SNF (31) Provider:  Durenda Age, NP  Patient Care Team: Patient, No Pcp Per as PCP - General (General Practice) Glendon Axe, MD (Internal Medicine)  Extended Emergency Contact Information Primary Emergency Contact: [name] Mobile Phone: [phone] Relation: Daughter Secondary Emergency Contact: Lindi Adie Address: [address]          [place], [place] [address] Johnnette Litter of [place] Phone: [phone] Relation: Other  Code Status:  DNR  Goals of care: Advanced Directive information Advanced Directives 04/27/2018  Does Patient Have a Medical Advance Directive? Yes  Type of Advance Directive Out of facility DNR (pink MOST or yellow form)  Does patient want to make changes to medical advance directive? No - Patient declined  Copy of [place] in Chart? -  Would patient like information on creating a medical advance directive? No - Patient declined  Pre-existing out of facility DNR order (yellow form or pink MOST form) -     Chief Complaint  Patient presents with  . Acute Visit    Low protein    HPI:  Pt is an 84 y.o. female seen today for low protein. She is a long-term care resident of [facility]. She has a PMH of skin cancer, central cord syndrome, hypertension, severe protein-calorie malnutrition, and tetraparesis. She was seen in her room today. Latest albumin is 2.9, low. She has bilateral heel wounds. She has a steady weight loss since admission. RD notes reviewed - had 7.6% in 30 days, 15.9% in 90 days, BMI 25.73. She eats 45% per meal. 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 She said that she swells up whenever she takes anything that has lactose.   Past Medical History:  Diagnosis Date  . Acute blood loss anemia   . Arthritis   . Cancer ([place])    skin  . Central  cord syndrome at C6 level of cervical spinal cord ([place]) 11/29/2017  . Hypertension   . Protein-calorie malnutrition, severe ([place]) 01/24/2018  . S/P insertion of IVC (inferior vena caval) filter 01/24/2018  . 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[facility])    Past Surgical History:  Procedure Laterality Date  . ANTERIOR CERVICAL DECOMP/DISCECTOMY FUSION N/A 11/29/2017   Procedure: Cervical five-six, six-seven Anterior Cervical Decompression Fusion;  Surgeon: Judith Part, MD;  Location: [facility];  Service: Neurosurgery;  Laterality: N/A;  Cervical five-six, six-seven Anterior Cervical Decompression Fusion  . CATARACT EXTRACTION    . IR IVC FILTER PLMT / S&I /IMG GUID/MOD SED  12/08/2017    Allergies  Allergen Reactions  . Other Swelling  . Lisinopril-Hydrochlorothiazide Swelling    Swelling of the tongue  . Dairy Aid [Lactase] Swelling  . Lisinopril-Hydrochlorothiazide Swelling    Tongue swelling  . Pork-Derived Products Swelling    Outpatient Encounter Medications as of 04/27/2018  Medication Sig  . acetaminophen (TYLENOL) 325 MG tablet Take 650 mg by mouth every 6 (six) hours.   . Amino Acids-Protein Hydrolys (FEEDING SUPPLEMENT, PRO-STAT SUGAR FREE 64,) LIQD Take 30 mLs by mouth 4 (four) times daily.   Marland Kitchen amLODipine (NORVASC) 10 MG tablet Take 10 mg by mouth daily after supper.   . bisacodyl (DULCOLAX) 10 MG suppository Place 10 mg rectally daily as needed for moderate constipation.  . calcium carbonate (TUMS EX) 750 MG chewable tablet Chew 1 tablet by mouth 3 (three) times daily.  . Capsaicin  0.1 % CREA Apply 1 application topically 2 (two) times daily. Apply to left shoulder, bilateral hands, and knees  . collagenase (SANTYL) ointment Apply nickel thick amount topically once daily.  Clean left heel wound with normal saline, skin prep periwound, apply santy nickel thick to wound bed and normal saline gauze, cover with allevyn dressing.  . DULoxetine (CYMBALTA) 60 MG capsule Take 60 mg by mouth daily.   Marland Kitchen  gabapentin (NEURONTIN) 400 MG capsule Take 400 mg by mouth every 6 (six) hours.  . Glycerin, Adult, 2.1 g SUPP Place 1 suppository rectally daily at 6 (six) AM.  . loratadine (CLARITIN) 10 MG tablet Take 10 mg by mouth daily.   . Multiple Vitamin (MULTIVITAMIN WITH MINERALS) TABS tablet Take 1 tablet by mouth daily.  . NON FORMULARY Diet Type:  Regular - NO BEEF, PORK OR DAIRY PRODUCTS  . nortriptyline (PAMELOR) 25 MG capsule Take 25 mg by mouth at bedtime.  Oneta Rack Supplies (SKIN PREP WIPES) MISC Apply to both heals daily  . oxyCODONE (OXY IR/ROXICODONE) 5 MG immediate release tablet Take 0.5 tablets (2.5 mg total) by mouth every 12 hours as needed for severe pain.  . Probiotic Product (RISA-BID PROBIOTIC) TABS Take 1 tablet by mouth daily.   . rivaroxaban (XARELTO) 20 MG TABS tablet Take 1 tablet (20 mg total) by mouth daily with supper.  . Skin Protectants, Misc. (ENDIT EX) Apply topically to bottom / peri-area after preforming peri-care  . sodium chloride (OCEAN) 0.65 % SOLN nasal spray Place 2 sprays into both nostrils 3 (three) times daily as needed for congestion.  Marland Kitchen spironolactone (ALDACTONE) 25 MG tablet Take 25 mg by mouth 2 (two) times daily.   . tizanidine (ZANAFLEX) 2 MG capsule Take 2 mg by mouth every 8 (eight) hours.    No facility-administered encounter medications on file as of 04/27/2018.     Review of Systems  GENERAL: No fever, chills or weakness MOUTH and THROAT: Denies oral discomfort, gingival pain or bleeding, pain from teeth or hoarseness   RESPIRATORY: no cough, SOB, DOE, wheezing, hemoptysis CARDIAC: No chest pain, edema or palpitations PSYCHIATRIC: Denies feelings of depression or anxiety. No report of hallucinations, insomnia, paranoia, or agitation   Immunization History  Administered Date(s) Administered  . Tdap 11/29/2017   Pertinent  Health Maintenance Due  Topic Date Due  . INFLUENZA VACCINE  11/02/2018 (Originally 09/01/2017)  . PNA vac Low Risk  Adult (1 of 2 - PCV13) 04/13/2019 (Originally 09/03/2001)  . DEXA SCAN  Discontinued   Fall Risk  02/22/2018  Falls in the past year? Exclusion - non ambulatory  Comment hoyer lift     Vitals:   04/27/18 1322  BP: 118/75  Pulse: 86  Resp: 20  Temp: (!) 97.5 F (36.4 C)  TempSrc: Oral  SpO2: 98%  Weight: 164 lb 4.8 oz (74.5 kg)  Height: 5\' 7"  (1.702 m)   Body mass index is 25.73 kg/m.  Physical Exam  GENERAL APPEARANCE:  In no acute distress.  SKIN:  Bilateral heels wound with dressing MOUTH and THROAT: Lips are without lesions.  RESPIRATORY: Breathing is even & unlabored, BS CTAB CARDIAC: RRR, no murmur,no extra heart sounds, no edema GI: Abdomen soft, normal BS, no masses, no tenderness GU: Has indwelling foley catheter NEUROLOGICAL: There is no tremor. Speech is clear.  Alert and oriented X 3. PSYCHIATRIC: Affect and behavior are appropriate   Labs reviewed: Recent Labs    02/20/18 1653  03/10/18 0530 03/13/18 0642 04/14/18  0610  NA 138   < > 137 138 137  K 3.6   < > 3.7 3.9 4.0  CL 98   < > 103 103 100  CO2 33*   < > 29 28 27   GLUCOSE 96   < > 93 87 88  BUN 12   < > 15 16 15   CREATININE 0.40*   < > 0.31* 0.34* 0.41*  CALCIUM 8.5*   < > 8.6* 8.8* 9.0  MG 1.9  --  1.9 1.9  --    < > = values in this interval not displayed.   Recent Labs    01/06/18 0502 02/10/18 0500 02/20/18 1653 04/06/18 0615  AST 23 26 39  --   ALT 29 17 21   --   ALKPHOS 84 77 85  --   BILITOT 1.0 0.5 0.7  --   PROT 5.3* 4.9* 5.9* 5.5*  ALBUMIN 2.3* 2.2* 2.8* 2.9*   Recent Labs    01/30/18 0510 02/20/18 1653 04/14/18 0610  WBC 11.8* 10.2 6.4  NEUTROABS 7.7 6.7 3.0  HGB 9.7* 12.3 11.1*  HCT 31.3* 40.0 34.2*  MCV 89.2 90.9 89.3  PLT 661* 524* 410*    Assessment/Plan  1. Hypoalbuminemia - albumin 2.9, will increase Pro-stat 30 ml to 6X/day per recommendation of dietitian for supplementation and wound healing, will not order Ensure clear since it contains lactose to  which she is allergic to  2. Weight loss Body mass index is 25.73 kg/m. - has steady weight loss, will encourage to consume 100% of her meals, start Pro-stat 30 ml 6X/day    Family/ staff Communication: Discussed plan of care with resident.  Labs/tests ordered:  None  Goals of care:   Long-term care.   Durenda Age, NP [facility] and Adult [facility] [phone] (Monday-Friday 8:00 a.m. - 5:00 p.m.) [phone] (after hours)

## 2018-05-03 ENCOUNTER — Encounter
Admission: RE | Admit: 2018-05-03 | Discharge: 2018-05-03 | Disposition: A | Discharge status: Home / Self Care | Payer: Medicare HMO | Source: Ambulatory Visit | Attending: Internal Medicine | Admitting: Internal Medicine

## 2018-05-15 ENCOUNTER — Encounter: Payer: Self-pay | Admitting: Adult Health

## 2018-05-15 ENCOUNTER — Non-Acute Institutional Stay (SKILLED_NURSING_FACILITY): Payer: Medicare HMO | Admitting: Adult Health

## 2018-05-15 DIAGNOSIS — I10 Essential (primary) hypertension: Secondary | ICD-10-CM

## 2018-05-15 DIAGNOSIS — E43 Unspecified severe protein-calorie malnutrition: Secondary | ICD-10-CM | POA: Diagnosis not present

## 2018-05-15 DIAGNOSIS — N319 Neuromuscular dysfunction of bladder, unspecified: Secondary | ICD-10-CM | POA: Diagnosis not present

## 2018-05-15 DIAGNOSIS — S14129S Central cord syndrome at unspecified level of cervical spinal cord, sequela: Secondary | ICD-10-CM

## 2018-05-15 DIAGNOSIS — R600 Localized edema: Secondary | ICD-10-CM | POA: Diagnosis not present

## 2018-05-15 DIAGNOSIS — I82503 Chronic embolism and thrombosis of unspecified deep veins of lower extremity, bilateral: Secondary | ICD-10-CM

## 2018-05-15 DIAGNOSIS — G8921 Chronic pain due to trauma: Secondary | ICD-10-CM

## 2018-05-15 DIAGNOSIS — M792 Neuralgia and neuritis, unspecified: Secondary | ICD-10-CM

## 2018-05-15 NOTE — Progress Notes (Signed)
[facility]:  [facility] Number: [place]:  SNF (31) Provider:  Durenda Age, NP  Patient Care Team: Patient, No Pcp Per as PCP - General (General Practice) Glendon Axe, MD (Internal Medicine)  Extended Emergency Contact Information Primary Emergency Contact: [name] Mobile Phone: [phone] Relation: Daughter Secondary Emergency Contact: Lindi Adie Address: [address]          [place], [place] [address] Johnnette Litter of [place] Phone: [phone] Relation: Other  Code Status: DNR  Goals of care: Advanced Directive information Advanced Directives 05/15/2018  Does Patient Have a Medical Advance Directive? Yes  Type of Advance Directive Out of facility DNR (pink MOST or yellow form)  Does patient want to make changes to medical advance directive? No - Patient declined  Copy of [place] in Chart? -  Would patient like information on creating a medical advance directive? -  Pre-existing out of facility DNR order (yellow form or pink MOST form) -     Chief Complaint  Patient presents with  . Medical Management of Chronic Issues    Routine Visit    HPI:  Pt is a 84 y.o. female seen today for medical management of chronic diseases.  She has PMH of skin cancer, central cord syndrome, hypertension, severe protein calorie malnutrition and tetraparesis.  She was seen in the room today with sitter at bedside.  She said that her pain on both arms and hands are stable at 6/10.  She continues to have PT and OT for strengthening exercises.  Her pro-stat was recently increased to every 4 hours for protein calorie malnutrition, severe.  Latest weight is 162.2 lbs, Body mass index is 25.4 kg/m. Her BLE edema has diminished and she currently takes Spironolactone 25 mg BID.    Past Medical History:  Diagnosis Date  . Acute blood loss anemia   . Arthritis   . Cancer ([place])    skin  . Central  cord syndrome at C6 level of cervical spinal cord ([place]) 11/29/2017  . Hypertension   . Protein-calorie malnutrition, severe ([place]) 01/24/2018  . S/P insertion of IVC (inferior vena caval) filter 01/24/2018  . 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[facility])    Past Surgical History:  Procedure Laterality Date  . ANTERIOR CERVICAL DECOMP/DISCECTOMY FUSION N/A 11/29/2017   Procedure: Cervical five-six, six-seven Anterior Cervical Decompression Fusion;  Surgeon: Judith Part, MD;  Location: [facility];  Service: Neurosurgery;  Laterality: N/A;  Cervical five-six, six-seven Anterior Cervical Decompression Fusion  . CATARACT EXTRACTION    . IR IVC FILTER PLMT / S&I /IMG GUID/MOD SED  12/08/2017    Allergies  Allergen Reactions  . Other Swelling  . Lisinopril-Hydrochlorothiazide Swelling    Swelling of the tongue  . Dairy Aid [Lactase] Swelling  . Lisinopril-Hydrochlorothiazide Swelling    Tongue swelling  . Pork-Derived Products Swelling    Outpatient Encounter Medications as of 05/15/2018  Medication Sig  . acetaminophen (TYLENOL) 325 MG tablet Take 650 mg by mouth every 6 (six) hours.   Marland Kitchen alum & mag hydroxide-simeth (MAALOX PLUS) 400-400-40 MG/5ML suspension Take 30 mLs by mouth every 4 (four) hours as needed for indigestion.  . Amino Acids-Protein Hydrolys (FEEDING SUPPLEMENT, PRO-STAT SUGAR FREE 64,) LIQD Take 30 mLs by mouth 4 (four) times daily.   Marland Kitchen amLODipine (NORVASC) 10 MG tablet Take 10 mg by mouth daily after supper.   . bisacodyl (DULCOLAX) 10 MG suppository Place 10 mg rectally daily as needed for  moderate constipation.  . calcium carbonate (TUMS EX) 750 MG chewable tablet Chew 1 tablet by mouth 3 (three) times daily.  . Capsaicin 0.1 % CREA Apply 1 application topically 2 (two) times daily. Apply to left shoulder, bilateral hands, and knees  . collagenase (SANTYL) ointment Apply nickel thick amount topically once daily.  Clean left heel wound with normal saline, skin prep periwound, apply santy nickel  thick to wound bed and normal saline gauze, cover with allevyn dressing.  . DULoxetine (CYMBALTA) 60 MG capsule Take 60 mg by mouth daily.   Marland Kitchen gabapentin (NEURONTIN) 400 MG capsule Take 400 mg by mouth every 6 (six) hours.  . Glycerin, Adult, 2.1 g SUPP Place 1 suppository rectally daily at 6 (six) AM.  . hydrocortisone cream 1 % Apply 1 application topically 4 (four) times daily as needed for itching.  . loratadine (CLARITIN) 10 MG tablet Take 10 mg by mouth daily.   . Multiple Vitamin (MULTIVITAMIN WITH MINERALS) TABS tablet Take 1 tablet by mouth daily.  . NON FORMULARY Diet Type:  Regular - NO BEEF, PORK OR DAIRY PRODUCTS  . nortriptyline (PAMELOR) 25 MG capsule Take 25 mg by mouth at bedtime.  Oneta Rack Supplies (SKIN PREP WIPES) MISC Apply to both heals daily  . oxyCODONE (OXY IR/ROXICODONE) 5 MG immediate release tablet Take 0.5 tablets (2.5 mg total) by mouth every 12 hours as needed for severe pain.  . Probiotic Product (RISA-BID PROBIOTIC) TABS Take 1 tablet by mouth daily.   . rivaroxaban (XARELTO) 20 MG TABS tablet Take 1 tablet (20 mg total) by mouth daily with supper.  . Skin Protectants, Misc. (ENDIT EX) Apply topically to bottom / peri-area after preforming peri-care  . sodium chloride (OCEAN) 0.65 % SOLN nasal spray Place 2 sprays into both nostrils 3 (three) times daily as needed for congestion.  Marland Kitchen spironolactone (ALDACTONE) 25 MG tablet Take 25 mg by mouth 2 (two) times daily.   . tizanidine (ZANAFLEX) 2 MG capsule Take 2 mg by mouth every 8 (eight) hours.    No facility-administered encounter medications on file as of 05/15/2018.     Review of Systems  GENERAL: No change in appetite, no fatigue, no weight changes, no fever, chills or weakness MOUTH and THROAT: Denies oral discomfort, gingival pain or bleeding RESPIRATORY: no cough, SOB, DOE, wheezing, hemoptysis CARDIAC: No chest pain, or palpitations GI: No abdominal pain, diarrhea, constipation, heart burn, nausea or  vomiting NEUROLOGICAL: Denies dizziness, syncope, numbness, or headache PSYCHIATRIC: Denies feelings of depression or anxiety. No report of hallucinations, insomnia, paranoia, or agitation   Immunization History  Administered Date(s) Administered  . Tdap 11/29/2017   Pertinent  Health Maintenance Due  Topic Date Due  . PNA vac Low Risk Adult (1 of 2 - PCV13) 04/13/2019 (Originally 09/03/2001)  . INFLUENZA VACCINE  09/02/2018  . DEXA SCAN  Discontinued   Fall Risk  02/22/2018  Falls in the past year? Exclusion - non ambulatory  Comment hoyer lift     Vitals:   05/15/18 1432  BP: 119/70  Pulse: 94  Resp: 18  Temp: 98.1 F (36.7 C)  TempSrc: Oral  SpO2: 98%  Weight: 162 lb 3.2 oz (73.6 kg)  Height: 5\' 7"  (1.702 m)   Body mass index is 25.4 kg/m.  Physical Exam  GENERAL APPEARANCE:  In no acute distress.  SKIN:  Bilateral heels wound with dressing MOUTH and THROAT: Lips are without lesions. Oral mucosa is moist and without lesions. Tongue is normal in  shape, size, and color and without lesions RESPIRATORY: Breathing is even & unlabored, BS CTAB CARDIAC: RRR, no murmur,no extra heart sounds, BLE 2+ edema GI: Abdomen soft, normal BS, no masses, no tenderness, no hepatomegaly, no splenomegaly GU:  Has foley catheter draining to urine bag NEUROLOGICAL: There is no tremor. Speech is clear. Alert and oriented X 3. PSYCHIATRIC:  Affect and behavior are appropriate  Labs reviewed: Recent Labs    02/20/18 1653  03/10/18 0530 03/13/18 0642 04/14/18 0610  NA 138   < > 137 138 137  K 3.6   < > 3.7 3.9 4.0  CL 98   < > 103 103 100  CO2 33*   < > 29 28 27   GLUCOSE 96   < > 93 87 88  BUN 12   < > 15 16 15   CREATININE 0.40*   < > 0.31* 0.34* 0.41*  CALCIUM 8.5*   < > 8.6* 8.8* 9.0  MG 1.9  --  1.9 1.9  --    < > = values in this interval not displayed.   Recent Labs    01/06/18 0502 02/10/18 0500 02/20/18 1653 04/06/18 0615  AST 23 26 39  --   ALT 29 17 21   --    ALKPHOS 84 77 85  --   BILITOT 1.0 0.5 0.7  --   PROT 5.3* 4.9* 5.9* 5.5*  ALBUMIN 2.3* 2.2* 2.8* 2.9*   Recent Labs    01/30/18 0510 02/20/18 1653 04/14/18 0610  WBC 11.8* 10.2 6.4  NEUTROABS 7.7 6.7 3.0  HGB 9.7* 12.3 11.1*  HCT 31.3* 40.0 34.2*  MCV 89.2 90.9 89.3  PLT 661* 524* 410*    Assessment/Plan  1. Benign essential HTN -Stable, continue amlodipine 10 mg 1 tab daily  2. Neurogenic bladder -Has Foley catheter draining clear urine bag with clear yellowish urine  3. Bilateral lower extremity edema -Has 2+ BLE edema, improved, has ACE wraps, will decrease Aldactone 25 mg from twice a day to once a day  4. Protein-calorie malnutrition, severe ([place]) -Was recently started on pro-stat 30 mL every 4 hours  5. Chronic deep vein thrombosis (DVT) of both lower extremities, unspecified vein (H[facility]) -Continue Xarelto 20 mg 1 tab daily  6. Neuropathic pain -Continue gabapentin 400 mg 1 capsule every 6 hours  7. Chronic pain due to trauma -Continue acetaminophen 325 mg 2 tabs every 6 hours, Cymbalta DR 60 mg 1 capsule daily, nortriptyline 25 mg 1 capsule at bedtime, oxycodone 5 mg 1/2 tab = 2.5 mg every 12 hours PRN  8. Central cord syndrome, sequela ([place]) -We will continue tizanidine 2 mg 1 capsule every 8 hours for muscle spasm    Family/ staff Communication: Discussed plan of care with resident.  Labs/tests ordered: None  Goals of care: Short-term care   Durenda Age, NP [facility] and Adult [facility] [phone] (Monday-Friday 8:00 a.m. - 5:00 p.m.) [phone] (after hours)

## 2018-05-24 ENCOUNTER — Ambulatory Visit: Payer: Medicare HMO | Admitting: Urology

## 2018-05-24 ENCOUNTER — Encounter: Payer: Self-pay | Admitting: Urology

## 2018-05-29 ENCOUNTER — Encounter: Payer: Self-pay | Admitting: Adult Health

## 2018-05-29 ENCOUNTER — Non-Acute Institutional Stay (SKILLED_NURSING_FACILITY): Payer: Medicare HMO | Admitting: Adult Health

## 2018-05-29 DIAGNOSIS — R140 Abdominal distension (gaseous): Secondary | ICD-10-CM

## 2018-05-29 NOTE — Progress Notes (Signed)
Location:  [facility] Room Number: [place] of Service:  SNF (31) Provider:  Durenda Age, NP  Patient Care Team: Patient, No Pcp Per as PCP - General (General Practice) Glendon Axe, MD (Internal Medicine)  Extended Emergency Contact Information Primary Emergency Contact: [name] Mobile Phone: [phone] Relation: Daughter Secondary Emergency Contact: Lindi Adie Address: [address]          [place], [place] [address] Johnnette Litter of [place] Phone: [phone] Relation: Other  Code Status:  DNR  Goals of care: Advanced Directive information Advanced Directives 05/15/2018  Does Patient Have a Medical Advance Directive? Yes  Type of Advance Directive Out of facility DNR (pink MOST or yellow form)  Does patient want to make changes to medical advance directive? No - Patient declined  Copy of [place] in Chart? -  Would patient like information on creating a medical advance directive? -  Pre-existing out of facility DNR order (yellow form or pink MOST form) -     Chief Complaint  Patient presents with  . Acute Visit    Feeling bloated    HPI:  Pt is a 84 y.o. female seen today for feeling bloated. She verbalized having lots of gas. Charge nurse reported that she has been moving her bowels. Her abdomen is soft and denies tenderness upon palpation.     Past Medical History:  Diagnosis Date  . Acute blood loss anemia   . Arthritis   . Cancer ([place])    skin  . Central cord syndrome at C6 level of cervical spinal cord ([place]) 11/29/2017  . Hypertension   . Protein-calorie malnutrition, severe ([place]) 01/24/2018  . S/P insertion of IVC (inferior vena caval) filter 01/24/2018  . 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[facility])    Past Surgical History:  Procedure Laterality Date  . ANTERIOR CERVICAL DECOMP/DISCECTOMY FUSION N/A 11/29/2017   Procedure: Cervical five-six, six-seven Anterior Cervical Decompression Fusion;   Surgeon: Judith Part, MD;  Location: [place];  Service: Neurosurgery;  Laterality: N/A;  Cervical five-six, six-seven Anterior Cervical Decompression Fusion  . CATARACT EXTRACTION    . IR IVC FILTER PLMT / S&I /IMG GUID/MOD SED  12/08/2017    Allergies  Allergen Reactions  . Other Swelling  . Lisinopril-Hydrochlorothiazide Swelling    Swelling of the tongue  . Dairy Aid [Lactase] Swelling  . Lisinopril-Hydrochlorothiazide Swelling    Tongue swelling  . Pork-Derived Products Swelling    Outpatient Encounter Medications as of 05/29/2018  Medication Sig  . Alpha-D-Galactosidase (BEANO) TABS Take 150 mg by mouth 3 (three) times daily with meals as needed (Bloating). Give 2 tabs = 300 mg  . acetaminophen (TYLENOL) 325 MG tablet Take 650 mg by mouth every 6 (six) hours.   Marland Kitchen alum & mag hydroxide-simeth (MAALOX PLUS) 400-400-40 MG/5ML suspension Take 30 mLs by mouth every 4 (four) hours as needed for indigestion.  . Amino Acids-Protein Hydrolys (FEEDING SUPPLEMENT, PRO-STAT SUGAR FREE 64,) LIQD Take 30 mLs by mouth 4 (four) times daily.   Marland Kitchen amLODipine (NORVASC) 10 MG tablet Take 10 mg by mouth daily after supper.   . bisacodyl (DULCOLAX) 10 MG suppository Place 10 mg rectally daily as needed for moderate constipation.  . calcium carbonate (TUMS EX) 750 MG chewable tablet Chew 1 tablet by mouth 3 (three) times daily.  . Capsaicin 0.1 % CREA Apply 1 application topically 2 (two) times daily. Apply to left shoulder, bilateral hands, and knees  . collagenase (SANTYL) ointment Apply  nickel thick amount topically once daily.  Clean left heel wound with normal saline, skin prep periwound, apply santy nickel thick to wound bed and normal saline gauze, cover with allevyn dressing.  . DULoxetine (CYMBALTA) 60 MG capsule Take 60 mg by mouth daily.   Marland Kitchen gabapentin (NEURONTIN) 400 MG capsule Take 400 mg by mouth every 6 (six) hours.  . Glycerin, Adult, 2.1 g SUPP Place 1 suppository rectally daily at 6  (six) AM.  . hydrocortisone cream 1 % Apply 1 application topically 4 (four) times daily as needed for itching.  . loratadine (CLARITIN) 10 MG tablet Take 10 mg by mouth daily.   . Multiple Vitamin (MULTIVITAMIN WITH MINERALS) TABS tablet Take 1 tablet by mouth daily.  . NON FORMULARY Diet Type:  Regular - NO BEEF, PORK OR DAIRY PRODUCTS  . nortriptyline (PAMELOR) 25 MG capsule Take 25 mg by mouth at bedtime.  Oneta Rack Supplies (SKIN PREP WIPES) MISC Apply to both heals daily  . oxyCODONE (OXY IR/ROXICODONE) 5 MG immediate release tablet Take 0.5 tablets (2.5 mg total) by mouth every 12 hours as needed for severe pain.  . Probiotic Product (RISA-BID PROBIOTIC) TABS Take 1 tablet by mouth daily.   . rivaroxaban (XARELTO) 20 MG TABS tablet Take 1 tablet (20 mg total) by mouth daily with supper.  . Skin Protectants, Misc. (ENDIT EX) Apply topically to bottom / peri-area after preforming peri-care  . sodium chloride (OCEAN) 0.65 % SOLN nasal spray Place 2 sprays into both nostrils 3 (three) times daily as needed for congestion.  Marland Kitchen spironolactone (ALDACTONE) 25 MG tablet Take 25 mg by mouth 2 (two) times daily.   . tizanidine (ZANAFLEX) 2 MG capsule Take 2 mg by mouth every 8 (eight) hours.    No facility-administered encounter medications on file as of 05/29/2018.     Review of Systems  GENERAL: No change in appetite, no fatigue, no weight changes, no fever, chills or weakness MOUTH and THROAT: Denies oral discomfort, gingival pain or bleeding, pain from teeth or hoarseness   RESPIRATORY: no cough, SOB, DOE, wheezing, hemoptysis CARDIAC: No chest pain, edema or palpitations GI: feels bloated NEUROLOGICAL: Denies dizziness, syncope, numbness, or headache PSYCHIATRIC: Denies feelings of depression or anxiety. No report of hallucinations, insomnia, paranoia, or agitation    Immunization History  Administered Date(s) Administered  . Tdap 11/29/2017   Pertinent  Health Maintenance Due  Topic  Date Due  . PNA vac Low Risk Adult (1 of 2 - PCV13) 04/13/2019 (Originally 09/03/2001)  . INFLUENZA VACCINE  09/02/2018  . DEXA SCAN  Discontinued   Fall Risk  02/22/2018  Falls in the past year? Exclusion - non ambulatory  Comment hoyer lift     Vitals:   05/29/18 1403  BP: 122/71  Pulse: 85  Resp: 20  Temp: 98.3 F (36.8 C)  SpO2: 97%  Weight: 166 lb 4.8 oz (75.4 kg)  Height: 5\' 7"  (1.702 m)   Body mass index is 26.05 kg/m.  Physical Exam  GENERAL APPEARANCE:  In no acute distress. Normal body habitus SKIN:  Skin is warm and dry.  MOUTH and THROAT: Lips are without lesions. Oral mucosa is moist and without lesions. Tongue is normal in shape, size, and color and without lesions RESPIRATORY: Breathing is even & unlabored, BS CTAB CARDIAC: RRR, no murmur,no extra heart sounds, LLE 2-3+ edema GI: Abdomen soft, normal BS, no masses, no tenderness GU:  Has foley catheter draining clear yellowish urine NEUROLOGICAL: There is no tremor.  Speech is clear. Alert and oriented X 3. PSYCHIATRIC:  Affect and behavior are appropriate   Labs reviewed: Recent Labs    02/20/18 1653  03/10/18 0530 03/13/18 0642 04/14/18 0610  NA 138   < > 137 138 137  K 3.6   < > 3.7 3.9 4.0  CL 98   < > 103 103 100  CO2 33*   < > 29 28 27   GLUCOSE 96   < > 93 87 88  BUN 12   < > 15 16 15   CREATININE 0.40*   < > 0.31* 0.34* 0.41*  CALCIUM 8.5*   < > 8.6* 8.8* 9.0  MG 1.9  --  1.9 1.9  --    < > = values in this interval not displayed.   Recent Labs    01/06/18 0502 02/10/18 0500 02/20/18 1653 04/06/18 0615  AST 23 26 39  --   ALT 29 17 21   --   ALKPHOS 84 77 85  --   BILITOT 1.0 0.5 0.7  --   PROT 5.3* 4.9* 5.9* 5.5*  ALBUMIN 2.3* 2.2* 2.8* 2.9*   Recent Labs    01/30/18 0510 02/20/18 1653 04/14/18 0610  WBC 11.8* 10.2 6.4  NEUTROABS 7.7 6.7 3.0  HGB 9.7* 12.3 11.1*  HCT 31.3* 40.0 34.2*  MCV 89.2 90.9 89.3  PLT 661* 524* 410*      Assessment/Plan  1. Bloating - will  start on Beano 150 mg 2 tabs = 300 mg TID with meals PRN, instructed patient to notify charge nurse if bloating persists    Family/ staff Communication: Discussed plan of care with resident.  Labs/tests ordered: None  Goals of care:   Short-term care   Durenda Age, NP [facility] [phone] (Monday-Friday 8:00 a.m. - 5:00 p.m.) [phone] (after hours)

## 2018-05-30 ENCOUNTER — Other Ambulatory Visit: Payer: Self-pay

## 2018-05-30 ENCOUNTER — Inpatient Hospital Stay
Admission: EM | Admit: 2018-05-30 | Discharge: 2018-06-02 | DRG: 371 | Disposition: A | Discharge status: Skilled Nursing Facility | Payer: Medicare HMO | Attending: General Surgery | Admitting: General Surgery

## 2018-05-30 ENCOUNTER — Emergency Department: Payer: Medicare HMO

## 2018-05-30 ENCOUNTER — Other Ambulatory Visit
Admission: RE | Admit: 2018-05-30 | Discharge: 2018-05-30 | Disposition: A | Discharge status: Home / Self Care | Payer: Medicare HMO | Source: Ambulatory Visit | Attending: Adult Health | Admitting: Adult Health

## 2018-05-30 DIAGNOSIS — Z7401 Bed confinement status: Secondary | ICD-10-CM | POA: Diagnosis not present

## 2018-05-30 DIAGNOSIS — K37 Unspecified appendicitis: Secondary | ICD-10-CM

## 2018-05-30 DIAGNOSIS — I10 Essential (primary) hypertension: Secondary | ICD-10-CM | POA: Diagnosis present

## 2018-05-30 DIAGNOSIS — K3533 Acute appendicitis with perforation and localized peritonitis, with abscess: Principal | ICD-10-CM | POA: Diagnosis present

## 2018-05-30 DIAGNOSIS — R7989 Other specified abnormal findings of blood chemistry: Secondary | ICD-10-CM | POA: Diagnosis present

## 2018-05-30 DIAGNOSIS — Z7901 Long term (current) use of anticoagulants: Secondary | ICD-10-CM

## 2018-05-30 DIAGNOSIS — K5641 Fecal impaction: Secondary | ICD-10-CM | POA: Diagnosis present

## 2018-05-30 DIAGNOSIS — Z95828 Presence of other vascular implants and grafts: Secondary | ICD-10-CM | POA: Diagnosis not present

## 2018-05-30 DIAGNOSIS — Z66 Do not resuscitate: Secondary | ICD-10-CM | POA: Diagnosis present

## 2018-05-30 DIAGNOSIS — Z79899 Other long term (current) drug therapy: Secondary | ICD-10-CM

## 2018-05-30 DIAGNOSIS — Z86718 Personal history of other venous thrombosis and embolism: Secondary | ICD-10-CM | POA: Diagnosis not present

## 2018-05-30 DIAGNOSIS — K592 Neurogenic bowel, not elsewhere classified: Secondary | ICD-10-CM | POA: Diagnosis present

## 2018-05-30 DIAGNOSIS — N319 Neuromuscular dysfunction of bladder, unspecified: Secondary | ICD-10-CM | POA: Diagnosis present

## 2018-05-30 DIAGNOSIS — S14126A Central cord syndrome at C6 level of cervical spinal cord, initial encounter: Secondary | ICD-10-CM | POA: Diagnosis present

## 2018-05-30 DIAGNOSIS — E46 Unspecified protein-calorie malnutrition: Secondary | ICD-10-CM | POA: Diagnosis present

## 2018-05-30 LAB — URINALYSIS, COMPLETE (UACMP) WITH MICROSCOPIC
Bilirubin Urine: NEGATIVE
Glucose, UA: NEGATIVE mg/dL
Hgb urine dipstick: NEGATIVE
Ketones, ur: NEGATIVE mg/dL
Nitrite: POSITIVE — AB
Protein, ur: 30 mg/dL — AB
Specific Gravity, Urine: 1.021 (ref 1.005–1.030)
pH: 8 (ref 5.0–8.0)

## 2018-05-30 LAB — COMPREHENSIVE METABOLIC PANEL
ALT: 17 U/L (ref 0–44)
AST: 24 U/L (ref 15–41)
Albumin: 3.3 g/dL — ABNORMAL LOW (ref 3.5–5.0)
Alkaline Phosphatase: 118 U/L (ref 38–126)
Anion gap: 12 (ref 5–15)
BUN: 15 mg/dL (ref 8–23)
CO2: 26 mmol/L (ref 22–32)
Calcium: 8.8 mg/dL — ABNORMAL LOW (ref 8.9–10.3)
Chloride: 96 mmol/L — ABNORMAL LOW (ref 98–111)
Creatinine, Ser: 0.45 mg/dL (ref 0.44–1.00)
GFR calc Af Amer: >60 mL/min (ref >60)
GFR calc non Af Amer: >60 mL/min (ref >60)
Glucose, Bld: 116 mg/dL — ABNORMAL HIGH (ref 70–99)
Potassium: 4.2 mmol/L (ref 3.5–5.1)
Sodium: 134 mmol/L — ABNORMAL LOW (ref 135–145)
Total Bilirubin: 1 mg/dL (ref 0.3–1.2)
Total Protein: 6.4 g/dL — ABNORMAL LOW (ref 6.5–8.1)

## 2018-05-30 LAB — CBC WITH DIFFERENTIAL/PLATELET
Abs Immature Granulocytes: 0.04 10*3/uL (ref 0.00–0.07)
Basophils Absolute: 0 10*3/uL (ref 0.0–0.1)
Basophils Relative: 0 %
Eosinophils Absolute: 0.2 10*3/uL (ref 0.0–0.5)
Eosinophils Relative: 2 %
HCT: 38.2 % (ref 36.0–46.0)
Hemoglobin: 12.5 g/dL (ref 12.0–15.0)
Immature Granulocytes: 0 %
Lymphocytes Relative: 25 %
Lymphs Abs: 3.2 10*3/uL (ref 0.7–4.0)
MCH: 28.3 pg (ref 26.0–34.0)
MCHC: 32.7 g/dL (ref 30.0–36.0)
MCV: 86.4 fL (ref 80.0–100.0)
Monocytes Absolute: 0.8 10*3/uL (ref 0.1–1.0)
Monocytes Relative: 6 %
Neutro Abs: 8.5 10*3/uL — ABNORMAL HIGH (ref 1.7–7.7)
Neutrophils Relative %: 67 %
Platelets: 472 10*3/uL — ABNORMAL HIGH (ref 150–400)
RBC: 4.42 MIL/uL (ref 3.87–5.11)
RDW: 14.6 % (ref 11.5–15.5)
WBC: 12.7 10*3/uL — ABNORMAL HIGH (ref 4.0–10.5)
nRBC: 0 % (ref 0.0–0.2)

## 2018-05-30 LAB — ALBUMIN: Albumin: 3.2 g/dL — ABNORMAL LOW (ref 3.5–5.0)

## 2018-05-30 LAB — TROPONIN I: Troponin I: 0.03 ng/mL (ref <0.03)

## 2018-05-30 LAB — LIPASE, BLOOD: Lipase: 29 U/L (ref 11–51)

## 2018-05-30 LAB — PROTEIN, TOTAL: Total Protein: 6.5 g/dL (ref 6.5–8.1)

## 2018-05-30 MED ORDER — AMLODIPINE BESYLATE 5 MG PO TABS
10.0000 mg | ORAL_TABLET | Freq: Every day | ORAL | Status: DC
  Administered 2018-05-31 – 2018-06-01 (×2): 10 mg via ORAL
  Filled 2018-05-30 (×2): qty 2

## 2018-05-30 MED ORDER — IOHEXOL 240 MG/ML SOLN
50.0000 mL | Freq: Once | INTRAMUSCULAR | Status: AC | PRN
  Administered 2018-05-30: 50 mL via ORAL

## 2018-05-30 MED ORDER — CAPSAICIN 0.025 % EX CREA
1.0000 "application " | TOPICAL_CREAM | Freq: Two times a day (BID) | CUTANEOUS | Status: DC
  Administered 2018-05-31 – 2018-06-01 (×5): 1 via TOPICAL
  Filled 2018-05-30: qty 60

## 2018-05-30 MED ORDER — PIPERACILLIN-TAZOBACTAM 3.375 G IVPB 30 MIN
3.3750 g | Freq: Once | INTRAVENOUS | Status: AC
  Administered 2018-05-30: 21:00:00 3.375 g via INTRAVENOUS
  Filled 2018-05-30: qty 50

## 2018-05-30 MED ORDER — GLYCERIN (LAXATIVE) 2.1 G RE SUPP
1.0000 | Freq: Every day | RECTAL | Status: DC
  Administered 2018-05-31: 06:00:00 1 via RECTAL
  Filled 2018-05-30 (×2): qty 1

## 2018-05-30 MED ORDER — BISACODYL 10 MG RE SUPP: 10.0000 mg | Freq: Every day | RECTAL | Status: DC | PRN

## 2018-05-30 MED ORDER — COLLAGENASE 250 UNIT/GM EX OINT
TOPICAL_OINTMENT | Freq: Every day | CUTANEOUS | Status: DC | PRN
  Filled 2018-05-30: qty 30

## 2018-05-30 MED ORDER — GLYCERIN (LAXATIVE) 2.1 G RE SUPP
1.0000 | Freq: Every day | RECTAL | Status: DC
  Filled 2018-05-30: qty 1

## 2018-05-30 MED ORDER — ALUM & MAG HYDROXIDE-SIMETH 200-200-20 MG/5ML PO SUSP
30.0000 mL | ORAL | Status: DC | PRN
  Administered 2018-05-31: 16:00:00 30 mL via ORAL
  Filled 2018-05-30: qty 30

## 2018-05-30 MED ORDER — NORTRIPTYLINE HCL 25 MG PO CAPS
25.0000 mg | ORAL_CAPSULE | Freq: Every day | ORAL | Status: DC
  Administered 2018-05-31 – 2018-06-01 (×3): 25 mg via ORAL
  Filled 2018-05-30 (×4): qty 1

## 2018-05-30 MED ORDER — IOHEXOL 300 MG/ML  SOLN
100.0000 mL | Freq: Once | INTRAMUSCULAR | Status: AC | PRN
  Administered 2018-05-30: 100 mL via INTRAVENOUS

## 2018-05-30 MED ORDER — LORATADINE 10 MG PO TABS
10.0000 mg | ORAL_TABLET | Freq: Every day | ORAL | Status: DC
  Administered 2018-05-31 – 2018-06-02 (×3): 10 mg via ORAL
  Filled 2018-05-30 (×3): qty 1

## 2018-05-30 MED ORDER — ACETAMINOPHEN 650 MG RE SUPP: 650.0000 mg | Freq: Four times a day (QID) | RECTAL | Status: DC | PRN

## 2018-05-30 MED ORDER — ONDANSETRON HCL 4 MG/2ML IJ SOLN: 4.0000 mg | Freq: Four times a day (QID) | INTRAMUSCULAR | Status: DC | PRN

## 2018-05-30 MED ORDER — PIPERACILLIN-TAZOBACTAM 3.375 G IVPB
3.3750 g | Freq: Three times a day (TID) | INTRAVENOUS | Status: DC
  Administered 2018-05-31 – 2018-06-02 (×7): 3.375 g via INTRAVENOUS
  Filled 2018-05-30 (×7): qty 50

## 2018-05-30 MED ORDER — SALINE SPRAY 0.65 % NA SOLN
2.0000 | Freq: Three times a day (TID) | NASAL | Status: DC | PRN
  Filled 2018-05-30: qty 44

## 2018-05-30 MED ORDER — ACETAMINOPHEN 325 MG PO TABS: 650.0000 mg | ORAL_TABLET | Freq: Four times a day (QID) | ORAL | Status: DC | PRN

## 2018-05-30 MED ORDER — TIZANIDINE HCL 2 MG PO TABS
2.0000 mg | ORAL_TABLET | Freq: Three times a day (TID) | ORAL | Status: DC
  Administered 2018-05-31 – 2018-06-02 (×8): 2 mg via ORAL
  Filled 2018-05-30 (×11): qty 1

## 2018-05-30 MED ORDER — SODIUM CHLORIDE 0.9 % IV SOLN
INTRAVENOUS | Status: DC
  Administered 2018-05-30 – 2018-06-01 (×3): via INTRAVENOUS

## 2018-05-30 MED ORDER — GABAPENTIN 400 MG PO CAPS
400.0000 mg | ORAL_CAPSULE | Freq: Four times a day (QID) | ORAL | Status: DC
  Administered 2018-05-31 – 2018-06-02 (×11): 400 mg via ORAL
  Filled 2018-05-30 (×11): qty 1

## 2018-05-30 MED ORDER — ONDANSETRON 4 MG PO TBDP: 4.0000 mg | ORAL_TABLET | Freq: Four times a day (QID) | ORAL | Status: DC | PRN

## 2018-05-30 MED ORDER — MORPHINE SULFATE (PF) 2 MG/ML IV SOLN: 2.0000 mg | INTRAVENOUS | Status: DC | PRN

## 2018-05-30 MED ORDER — HYDROCODONE-ACETAMINOPHEN 5-325 MG PO TABS
1.0000 | ORAL_TABLET | ORAL | Status: DC | PRN
  Administered 2018-05-31: 2 via ORAL
  Filled 2018-05-30: qty 2

## 2018-05-30 MED ORDER — SPIRONOLACTONE 25 MG PO TABS
25.0000 mg | ORAL_TABLET | Freq: Two times a day (BID) | ORAL | Status: DC
  Administered 2018-05-31 – 2018-06-02 (×5): 25 mg via ORAL
  Filled 2018-05-30 (×5): qty 1

## 2018-05-30 MED ORDER — DULOXETINE HCL 30 MG PO CPEP
60.0000 mg | ORAL_CAPSULE | Freq: Every day | ORAL | Status: DC
  Administered 2018-05-31 – 2018-06-02 (×3): 60 mg via ORAL
  Filled 2018-05-30 (×3): qty 2

## 2018-05-30 MED ORDER — FAMOTIDINE IN NACL 20-0.9 MG/50ML-% IV SOLN
20.0000 mg | Freq: Two times a day (BID) | INTRAVENOUS | Status: DC
  Administered 2018-05-31 – 2018-06-02 (×5): 20 mg via INTRAVENOUS
  Filled 2018-05-30 (×5): qty 50

## 2018-05-30 NOTE — Progress Notes (Signed)
ANTICOAGULATION CONSULT NOTE - Initial Consult  Pharmacy Consult for Argatroban  Indication: history of DVT  Allergies  Allergen Reactions  . Other Swelling  . Lisinopril-Hydrochlorothiazide Swelling    Swelling of the tongue  . Beef-Derived Products Swelling    Facial and lip swelling (due to tick bite)  . Dairy Aid [Lactase] Swelling  . Lisinopril-Hydrochlorothiazide Swelling    Tongue swelling  . Pork-Derived Products Swelling    Patient Measurements: Height: 5\' 6"  (167.6 cm) Weight: 166 lb (75.3 kg) IBW/kg (Calculated) : 59.3 Heparin Dosing Weight:    Vital Signs: Temp: 98.1 F (36.7 C) (04/28 1753) Temp Source: Oral (04/28 1753) BP: 125/77 (04/28 2200) Pulse Rate: 101 (04/28 2200)  Labs: Recent Labs    05/30/18 1758  HGB 12.5  HCT 38.2  PLT 472*  CREATININE 0.45  TROPONINI 0.03*    Estimated Creatinine Clearance: 57.2 mL/min (by C-G formula based on SCr of 0.45 mg/dL).   Medical History: Past Medical History:  Diagnosis Date  . Acute blood loss anemia   . Arthritis   . Cancer ([place])    skin  . Central cord syndrome at C6 level of cervical spinal cord ([place]) 11/29/2017  . Hypertension   . Protein-calorie malnutrition, severe ([place]) 01/24/2018  . S/P insertion of IVC (inferior vena caval) filter 01/24/2018  . 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([place])     Medications:  (Not in a hospital admission)   Assessment: Pharmacy consulted to dose argatroban in this 84 year old female with history of DVT.  Pt was on Xarelto at home previously, last dose of xarelto was on 4/28  @ 1600.   Pt has allergy to pork products so heparin is not indicated for this pt.    In order to dose argatroban we need a Child-Pugh score .   Relevant labs have been ordered for 4/29 AM.   Will dose argatroban when INR is resulted.   Goal of Therapy:  APTT = 50 - 90 seconds   Plan:  Start argatroban on 4/29 @ 1600 .  Dose depending on results from AM labs / Child-Pugh Score.   Cayson Kalb  D 05/30/2018,10:20 PM

## 2018-05-30 NOTE — ED Notes (Signed)
Patient transported to CT 

## 2018-05-30 NOTE — ED Notes (Signed)
Sherry [name] updated

## 2018-05-30 NOTE — ED Provider Notes (Addendum)
[facility] Emergency Department Provider Note  ____________________________________________   I have reviewed the triage vital signs and the nursing notes. Where available I have reviewed prior notes and, if possible and indicated, outside hospital notes.    HISTORY  Chief Complaint Bloated    HPI Sherry [name] is a 84 y.o. female who presents today complaining of feeling bloated.  Patient states she has been feeling bloated for the last couple days.  She denies having had this before.  She denies any abdominal pain fever vomiting diarrhea she denies prior surgery, she is DNR/DNI.  She states she is having normal bowel movements, no vomiting, and she is eating slightly less because she feels so bloated.    Past Medical History:  Diagnosis Date  . Acute blood loss anemia   . Arthritis   . Cancer ([place])    skin  . Central cord syndrome at C6 level of cervical spinal cord ([place]) 11/29/2017  . Hypertension   . Protein-calorie malnutrition, severe ([place]) 01/24/2018  . S/P insertion of IVC (inferior vena caval) filter 01/24/2018  . 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[facility])     Patient Active Problem List   Diagnosis Date Noted  . Hypocalcemia 02/15/2018  . Dysuria 02/15/2018  . Bilateral lower extremity edema 02/11/2018  . Vaginal yeast infection 01/30/2018  . Chest tightness 01/27/2018  . Healthcare-associated pneumonia 01/25/2018  . Pleural effusion, not elsewhere classified 01/25/2018  . Acute deep vein thrombosis (DVT) of left lower extremity ([place]) 01/24/2018  . Acute deep vein thrombosis (DVT) of right lower extremity ([place]) 01/24/2018  . Chronic allergic rhinitis 01/24/2018  . Depression with anxiety 01/24/2018  . UTI due to Klebsiella species 01/24/2018  . Protein-calorie malnutrition, severe ([place]) 01/24/2018  . S/P insertion of IVC (inferior vena caval) filter 01/24/2018  . Reactive depression   . Hypokalemia   . Leukocytosis   . Essential hypertension   .  Trauma   . 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([place])   . Neuropathic pain   . Neurogenic bowel   . Neurogenic bladder   . Benign essential HTN   . Acute blood loss anemia   . Central cord syndrome at C6 level of cervical spinal cord ([place]) 11/29/2017  . Central cord syndrome ([place]) 11/29/2017    Past Surgical History:  Procedure Laterality Date  . ANTERIOR CERVICAL DECOMP/DISCECTOMY FUSION N/A 11/29/2017   Procedure: Cervical five-six, six-seven Anterior Cervical Decompression Fusion;  Surgeon: Judith Part, MD;  Location: [place];  Service: Neurosurgery;  Laterality: N/A;  Cervical five-six, six-seven Anterior Cervical Decompression Fusion  . CATARACT EXTRACTION    . IR IVC FILTER PLMT / S&I /IMG GUID/MOD SED  12/08/2017    Prior to Admission medications   Medication Sig Start Date End Date Taking? Authorizing Provider  acetaminophen (TYLENOL) 325 MG tablet Take 650 mg by mouth every 6 (six) hours.  01/20/18   [provider]  Alpha-D-Galactosidase (BEANO) TABS Take 150 mg by mouth 3 (three) times daily with meals as needed (Bloating). Give 2 tabs = 300 mg    [provider]  alum & mag hydroxide-simeth (MAALOX PLUS) 400-400-40 MG/5ML suspension Take 30 mLs by mouth every 4 (four) hours as needed for indigestion.    [provider]  Amino Acids-Protein Hydrolys (FEEDING SUPPLEMENT, PRO-STAT SUGAR FREE 64,) LIQD Take 30 mLs by mouth 4 (four) times daily.  02/10/18   [provider]  amLODipine (NORVASC) 10 MG tablet Take 10 mg by mouth daily after supper.     [provider]  bisacodyl (DULCOLAX) 10 MG suppository Place 10 mg rectally daily as needed for moderate constipation. 01/24/18   [provider]  calcium carbonate (TUMS EX) 750 MG chewable tablet Chew 1 tablet by mouth 3 (three) times daily. 02/10/18   [provider]  Capsaicin 0.1 % CREA Apply 1 application topically 2 (two) times daily. Apply to left shoulder, bilateral hands, and knees     [provider]  collagenase (SANTYL) ointment Apply nickel thick amount topically once daily.  Clean left heel wound with normal saline, skin prep periwound, apply santy nickel thick to wound bed and normal saline gauze, cover with allevyn dressing. 02/04/18   [provider]  DULoxetine (CYMBALTA) 60 MG capsule Take 60 mg by mouth daily.  02/01/18   [provider]  gabapentin (NEURONTIN) 400 MG capsule Take 400 mg by mouth every 6 (six) hours. 01/20/18   [provider]  Glycerin, Adult, 2.1 g SUPP Place 1 suppository rectally daily at 6 (six) AM. 01/19/18   Angiulli, Lavon Paganini, PA-C  hydrocortisone cream 1 % Apply 1 application topically 4 (four) times daily as needed for itching.    [provider]  loratadine (CLARITIN) 10 MG tablet Take 10 mg by mouth daily.  01/19/18   [provider]  Multiple Vitamin (MULTIVITAMIN WITH MINERALS) TABS tablet Take 1 tablet by mouth daily.    [provider]  NON FORMULARY Diet Type:  Regular - NO BEEF, PORK OR DAIRY PRODUCTS    [provider]  nortriptyline (PAMELOR) 25 MG capsule Take 25 mg by mouth at bedtime. 01/20/18   [provider]  Ostomy Supplies (SKIN PREP WIPES) MISC Apply to both heals daily 01/20/18   [provider]  oxyCODONE (OXY IR/ROXICODONE) 5 MG immediate release tablet Take 0.5 tablets (2.5 mg total) by mouth every 12 hours as needed for severe pain. 03/29/18   Medina-Vargas, Monina C, NP  Probiotic Product (RISA-BID PROBIOTIC) TABS Take 1 tablet by mouth daily.  02/04/18   [provider]  rivaroxaban (XARELTO) 20 MG TABS tablet Take 1 tablet (20 mg total) by mouth daily with supper. 01/18/18   Angiulli, Lavon Paganini, PA-C  Skin Protectants, Misc. (ENDIT EX) Apply topically to bottom / peri-area after preforming peri-care 01/22/18   [provider]  sodium chloride (OCEAN) 0.65 % SOLN nasal spray Place 2 sprays into both nostrils 3 (three)  times daily as needed for congestion.    [provider]  spironolactone (ALDACTONE) 25 MG tablet Take 25 mg by mouth 2 (two) times daily.  03/08/18   [provider]  tizanidine (ZANAFLEX) 2 MG capsule Take 2 mg by mouth every 8 (eight) hours.  03/03/18   [provider]    Allergies Other; Lisinopril-hydrochlorothiazide; Beef-derived products; Dairy aid [lactase]; Lisinopril-hydrochlorothiazide; and Pork-derived products  Family History  Problem Relation Age of Onset  . Breast cancer Maternal Aunt 75    Social History Social History   Tobacco Use  . Smoking status: Never Smoker  . Smokeless tobacco: Never Used  Substance Use Topics  . Alcohol use: Never    Frequency: Never  . Drug use: Never    Review of Systems Constitutional: No fever/chills Eyes: No visual changes. ENT: No sore throat. No stiff neck no neck pain Cardiovascular: Denies chest pain. Respiratory: Denies shortness of breath. Gastrointestinal:   HPI Genitourinary: Negative for dysuria. Musculoskeletal: Negative lower extremity swelling Skin: Negative for rash. Neurological: Negative for severe headaches, focal weakness  or numbness.   ____________________________________________   PHYSICAL EXAM:  VITAL SIGNS: ED Triage Vitals  Enc Vitals Group     BP 05/30/18 1754 129/85     Pulse Rate 05/30/18 1754 98     Resp 05/30/18 1754 18     Temp 05/30/18 1753 98.1 F (36.7 C)     Temp Source 05/30/18 1753 Oral     SpO2 05/30/18 1754 97 %     Weight 05/30/18 1755 166 lb (75.3 kg)     Height 05/30/18 1755 5\' 6"  (1.676 m)     Head Circumference --      Peak Flow --      Pain Score 05/30/18 1754 6     Pain Loc --      Pain Edu? --      Excl. in [place]? --     Constitutional: Alert and oriented name and place unsure of the exact date. Well appearing and in no acute distress. Eyes: Conjunctivae are normal Head: Atraumatic HEENT: No congestion/rhinnorhea. Mucous membranes are moist.   Oropharynx non-erythematous Neck:   Nontender with no meningismus, no masses, no stridor Cardiovascular: Normal rate, regular rhythm. Grossly normal heart sounds.  Good peripheral circulation. Respiratory: Normal respiratory effort.  No retractions. Lungs CTAB. Abdominal: Soft and distended but not tender. No distention. No guarding no rebound Back:  There is no focal tenderness or step off.  there is no midline tenderness there are no lesions noted. there is no CVA tenderness Musculoskeletal: No lower extremity tenderness, no upper extremity tenderness. No joint effusions, no DVT signs strong distal pulses no edema Neurologic:  Normal speech and language.  Skin:  Skin is warm, dry and intact. No rash noted. Psychiatric: Mood and affect are normal. Speech and behavior are normal.  ____________________________________________   LABS (all labs ordered are listed, but only abnormal results are displayed)  Labs Reviewed  CBC WITH DIFFERENTIAL/PLATELET - Abnormal; Notable for the following components:      Result Value   WBC 12.7 (*)    Platelets 472 (*)    Neutro Abs 8.5 (*)    All other components within normal limits  COMPREHENSIVE METABOLIC PANEL  URINALYSIS, COMPLETE (UACMP) WITH MICROSCOPIC  TROPONIN I  LIPASE, BLOOD    Pertinent labs  results that were available during my care of the patient were reviewed by me and considered in my medical decision making (see chart for details). ____________________________________________  EKG  I personally interpreted any EKGs ordered by me or triage  ____________________________________________  RADIOLOGY  Pertinent labs & imaging results that were available during my care of the patient were reviewed by me and considered in my medical decision making (see chart for details). If possible, patient and/or family made aware of any abnormal findings.  No results found. ____________________________________________     PROCEDURES  Procedure(s) performed: None  Procedures  Critical Care performed: None  ____________________________________________   INITIAL IMPRESSION / ASSESSMENT AND PLAN / ED COURSE  Pertinent labs & imaging results that were available during my care of the patient were reviewed by me and considered in my medical decision making (see chart for details).  Well-appearing woman with a significant abdominal distention and.  We will obtain basic blood work and obtain CT scan and reevaluate      ____________________________________________   FINAL CLINICAL IMPRESSION(S) / ED DIAGNOSES  Final diagnoses:  None      This chart was dictated using voice recognition software.  Despite best efforts to proofread,  errors can occur which can change meaning.      Schuyler Amor, MD 05/30/18 Jeri Lager    Schuyler Amor, MD 05/30/18 2006

## 2018-05-30 NOTE — Consult Note (Signed)
SURGICAL HISTORY AND PHYSICAL NOTE   HISTORY OF PRESENT ILLNESS (HPI):  84 y.o. female presented to [facility] ED for evaluation of abdominal pain. Patient reports pain started 4 days ago. She reports that pain feels like she needs to pass a gas. Pain is generalized on the abdomen and not localized. Pain does not radiates to other part of the body. There is no aggravating or alleviating factor. Patient reports having regular bowel movements. It is important to mention that she has central cord syndrome and has decreased sensation on lower extremity and pelvis. She does has sensation but decreased. She is able to strain. Denies fever or chills. Denies nausea or diarrhea.  At ED patient had labs and imaging done. CBC shows leukocytosis of 12,000 WBC. CT scan shows severe constipation with fecal impaction. Ct also shows significant inflammation with phlegmonous process around the appendix suspicious of appendicitis with phlegmon.  During my evaluation the patient is on no distress. There is minimal tenderness on palpation.  Surgery is consulted by Dr. Burlene Arnt in this context for evaluation and management of appendicitis.  PAST MEDICAL HISTORY (PMH):  Past Medical History:  Diagnosis Date  . Acute blood loss anemia   . Arthritis   . Cancer ([place])    skin  . Central cord syndrome at C6 level of cervical spinal cord ([place]) 11/29/2017  . Hypertension   . Protein-calorie malnutrition, severe ([place]) 01/24/2018  . S/P insertion of IVC (inferior vena caval) filter 01/24/2018  . 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([place])      PAST SURGICAL HISTORY ([place]):  Past Surgical History:  Procedure Laterality Date  . ANTERIOR CERVICAL DECOMP/DISCECTOMY FUSION N/A 11/29/2017   Procedure: Cervical five-six, six-seven Anterior Cervical Decompression Fusion;  Surgeon: Judith Part, MD;  Location: [place];  Service: Neurosurgery;  Laterality: N/A;  Cervical five-six, six-seven Anterior Cervical Decompression Fusion  . CATARACT EXTRACTION     . IR IVC FILTER PLMT / S&I /IMG GUID/MOD SED  12/08/2017     MEDICATIONS:  Prior to Admission medications   Medication Sig Start Date End Date Taking? Authorizing Provider  acetaminophen (TYLENOL) 325 MG tablet Take 650 mg by mouth every 6 (six) hours.  01/20/18  Yes [provider]  alum & mag hydroxide-simeth (MAALOX PLUS) 400-400-40 MG/5ML suspension Take 30 mLs by mouth every 4 (four) hours as needed for indigestion.   Yes [provider]  amLODipine (NORVASC) 10 MG tablet Take 10 mg by mouth daily after supper.    Yes [provider]  calcium carbonate (TUMS EX) 750 MG chewable tablet Chew 1 tablet by mouth 3 (three) times daily. 02/10/18  Yes [provider]  Capsaicin 0.1 % CREA Apply 1 application topically 2 (two) times daily. Apply to left shoulder, bilateral hands, and knees   Yes [provider]  DULoxetine (CYMBALTA) 60 MG capsule Take 60 mg by mouth daily.  02/01/18  Yes [provider]  gabapentin (NEURONTIN) 400 MG capsule Take 400 mg by mouth every 6 (six) hours. 01/20/18  Yes [provider]  loratadine (CLARITIN) 10 MG tablet Take 10 mg by mouth daily.  01/19/18  Yes [provider]  Multiple Vitamin (MULTIVITAMIN WITH MINERALS) TABS tablet Take 1 tablet by mouth daily.   Yes [provider]  nortriptyline (PAMELOR) 25 MG capsule Take 25 mg by mouth at bedtime. 01/20/18  Yes [provider]  Probiotic Product (RISA-BID PROBIOTIC) TABS Take 1 tablet by mouth daily.  02/04/18  Yes [provider]  rivaroxaban Alveda Reasons)  20 MG TABS tablet Take 1 tablet (20 mg total) by mouth daily with supper. 01/18/18  Yes Angiulli, Lavon Paganini, PA-C  Skin Protectants, Misc. (ENDIT EX) Apply topically to bottom / peri-area after preforming peri-care 01/22/18  Yes [provider]  spironolactone (ALDACTONE) 25 MG tablet Take 25 mg by mouth 2 (two) times daily.  03/08/18  Yes [provider]   tizanidine (ZANAFLEX) 2 MG capsule Take 2 mg by mouth every 8 (eight) hours.  03/03/18  Yes [provider]  Alpha-D-Galactosidase (BEANO) TABS Take 150 mg by mouth 3 (three) times daily with meals as needed (Bloating). Give 2 tabs = 300 mg    [provider]  Amino Acids-Protein Hydrolys (FEEDING SUPPLEMENT, PRO-STAT SUGAR FREE 64,) LIQD Take 30 mLs by mouth 4 (four) times daily.  02/10/18   [provider]  bisacodyl (DULCOLAX) 10 MG suppository Place 10 mg rectally daily as needed for moderate constipation. 01/24/18   [provider]  collagenase (SANTYL) ointment Apply nickel thick amount topically once daily.  Clean left heel wound with normal saline, skin prep periwound, apply santy nickel thick to wound bed and normal saline gauze, cover with allevyn dressing. 02/04/18   [provider]  Glycerin, Adult, 2.1 g SUPP Place 1 suppository rectally daily at 6 (six) AM. 01/19/18   Angiulli, Lavon Paganini, PA-C  hydrocortisone cream 1 % Apply 1 application topically 4 (four) times daily as needed for itching.    [provider]  NON FORMULARY Diet Type:  Regular - NO BEEF, PORK OR DAIRY PRODUCTS    [provider]  Ostomy Supplies (SKIN PREP WIPES) MISC Apply to both heals daily 01/20/18   [provider]  oxyCODONE (OXY IR/ROXICODONE) 5 MG immediate release tablet Take 0.5 tablets (2.5 mg total) by mouth every 12 hours as needed for severe pain. 03/29/18   Medina-Vargas, Monina C, NP  sodium chloride (OCEAN) 0.65 % SOLN nasal spray Place 2 sprays into both nostrils 3 (three) times daily as needed for congestion.    [provider]     ALLERGIES:  Allergies  Allergen Reactions  . Other Swelling  . Lisinopril-Hydrochlorothiazide Swelling    Swelling of the tongue  . Beef-Derived Products Swelling    Facial and lip swelling (due to tick bite)  . Dairy Aid [Lactase] Swelling  . Lisinopril-Hydrochlorothiazide Swelling     Tongue swelling  . Pork-Derived Products Swelling     SOCIAL HISTORY:  Social History   Socioeconomic History  . Marital status: Married    Spouse name: Not on file  . Number of children: Not on file  . Years of education: Not on file  . Highest education level: Not on file  Occupational History  . Not on file  Social Needs  . Financial resource strain: Not on file  . Food insecurity:    Worry: Not on file    Inability: Not on file  . Transportation needs:    Medical: Not on file    Non-medical: Not on file  Tobacco Use  . Smoking status: Never Smoker  . Smokeless tobacco: Never Used  Substance and Sexual Activity  . Alcohol use: Never    Frequency: Never  . Drug use: Never  . Sexual activity: Not on file  Lifestyle  . Physical activity:    Days per week: Not on file    Minutes per session: Not on file  . Stress: Not on file  Relationships  . Social connections:  Talks on phone: Not on file    Gets together: Not on file    Attends religious service: Not on file    Active member of club or organization: Not on file    Attends meetings of clubs or organizations: Not on file    Relationship status: Not on file  . Intimate partner violence:    Fear of current or ex partner: Not on file    Emotionally abused: Not on file    Physically abused: Not on file    Forced sexual activity: Not on file  Other Topics Concern  . Not on file  Social History Narrative   ** Merged History Encounter **        The patient currently resides (home / rehab facility / nursing home): nursing home The patient normally is (ambulatory / bedbound): bedbound   FAMILY HISTORY:  Family History  Problem Relation Age of Onset  . Breast cancer Maternal Aunt 75     REVIEW OF SYSTEMS:  Constitutional: denies weight loss, fever, chills, or sweats  Eyes: denies any other vision changes, history of eye injury  ENT: denies sore throat, hearing problems  Respiratory: denies shortness of  breath, wheezing  Cardiovascular: denies chest pain, palpitations  Gastrointestinal: positive abdominal pain, denies nausea or vomiting  Genitourinary: denies burning with urination or urinary frequency. Chronic foley Musculoskeletal: denies any other joint pains or cramps  Skin: denies any other rashes or skin discolorations  Neurological: denies any other headache, dizziness, Positive for weakness (chronic) Psychiatric: denies any other depression, anxiety   All other review of systems were negative   VITAL SIGNS:  Temp:  [98.1 F (36.7 C)] 98.1 F (36.7 C) (04/28 1753) Pulse Rate:  [94-98] 94 (04/28 2001) Resp:  [13-18] 13 (04/28 2001) BP: (126-129)/(77-85) 126/77 (04/28 2001) SpO2:  [97 %] 97 % (04/28 2001) Weight:  [75.3 kg] 75.3 kg (04/28 1755)     Height: 5\' 6"  (167.6 cm) Weight: 75.3 kg BMI (Calculated): 26.81    PHYSICAL EXAM:  Constitutional:  -- Normal body habitus  -- Awake, alert, and oriented x3  Eyes:  -- Pupils equally round and reactive to light  -- No scleral icterus  Ear, nose, and throat:  -- No jugular venous distension  Pulmonary:  -- No crackles  -- Equal breath sounds bilaterally -- Breathing non-labored at rest Cardiovascular:  -- S1, S2 present  -- No pericardial rubs Gastrointestinal:  -- Abdomen soft, minimal tender on palpation in all quadrants, moderate-distended, tympanic, no guarding or rebound tenderness -- No abdominal masses appreciated, pulsatile or otherwise  Musculoskeletal and Integumentary:  -- Wounds or skin discoloration: left buttock grade one pressure point -- Extremities:  Bilateral pitting edema +1 Neurologic: motor weakness 1/5 in all extremities   Labs:  CBC Latest Ref Rng & Units 05/30/2018 04/14/2018 02/20/2018  WBC 4.0 - 10.5 K/uL 12.7(H) 6.4 10.2  Hemoglobin 12.0 - 15.0 g/dL 12.5 11.1(L) 12.3  Hematocrit 36.0 - 46.0 % 38.2 34.2(L) 40.0  Platelets 150 - 400 K/uL 472(H) 410(H) 524(H)   CMP Latest Ref Rng & Units  05/30/2018 05/30/2018 04/14/2018  Glucose 70 - 99 mg/dL 116(H) - 88  BUN 8 - 23 mg/dL 15 - 15  Creatinine 0.44 - 1.00 mg/dL 0.45 - 0.41(L)  Sodium 135 - 145 mmol/L 134(L) - 137  Potassium 3.5 - 5.1 mmol/L 4.2 - 4.0  Chloride 98 - 111 mmol/L 96(L) - 100  CO2 22 - 32 mmol/L 26 - 27  Calcium 8.9 -  10.3 mg/dL 8.8(L) - 9.0  Total Protein 6.5 - 8.1 g/dL 6.4(L) 6.5 -  Total Bilirubin 0.3 - 1.2 mg/dL 1.0 - -  Alkaline Phos 38 - 126 U/L 118 - -  AST 15 - 41 U/L 24 - -  ALT 0 - 44 U/L 17 - -    Imaging studies:  EXAM: CT ABDOMEN AND PELVIS WITH CONTRAST  TECHNIQUE: Multidetector CT imaging of the abdomen and pelvis was performed using the standard protocol following bolus administration of intravenous contrast.  CONTRAST:  179mL OMNIPAQUE IOHEXOL 300 MG/ML  SOLN  COMPARISON:  CT Chest, Abdomen, and Pelvis 11/28/2017  FINDINGS: Lower chest: Left lower lobe medial basal segment atelectasis. Mild right costophrenic angle atelectasis. Borderline to mild cardiomegaly. No pericardial or pleural effusion.  Hepatobiliary: Negative liver and gallbladder.  Pancreas: Negative.  Spleen: Negative.  Adrenals/Urinary Tract: Normal adrenal glands.  Bilateral renal enhancement and contrast excretion is symmetric and within normal limits. Simple appearing bilateral renal cysts. The urinary bladder is decompressed by a Foley catheter.  Stomach/Bowel: There is a 9 centimeter stool ball in the rectum and some incontinence of stool distally. There is perirectal stranding. The sigmoid colon is redundant with a retained gas and low-density stool, but is nondilated. Similar gas and low-density stool retained throughout the more proximal colon. The hepatic flexure is redundant. The cecum is dilated up to about 7 centimeters diameter. The terminal ileum is gas-filled but nondilated on series 2, image 58.  Best seen on coronal images series 5, images 29-51 the appendix arises along the  inferior surface of the dilated cecum near the midline and initially appears normal (images 32-35), but as it tracks laterally the calms abnormally dilated and appears to contain fluid (14 millimeters diameter image 42). At the tip of the appendix there is of 6 centimeter phlegmon like area which is inseparable from the proximal right lateral wall of the rectum (coronal image 51 and series 2, image 59. This is also inseparable from the right gonadal vein, although previously both ovaries were diminutive.  No free air or extraluminal gas identified. No free fluid identified.  Oral contrast was administered and has reached the distal small bowel but not yet the terminal ileum. No dilated small bowel. Small gastric hiatal hernia. Otherwise negative stomach.  Vascular/Lymphatic: IVC filter in place. Diminutive IVC. Aortoiliac calcified atherosclerosis. Major arterial structures are patent with mild generalized arterial tortuosity. Portal venous system is patent.  No lymphadenopathy.  Reproductive: Stable with mild uterine fibroid related calcification.  Other: No pelvic free fluid, but presacral stranding.  Musculoskeletal: Partially healed T11 vertebral body fracture (series 6, image 67). Mild chronic lower lumbar spondylolisthesis. Hip joint degeneration. No new osseous abnormality identified.  IMPRESSION: 1. Highly suspicious for Acute Appendicitis superimposed on fecal impaction with dilated cecum and colon with retained stool throughout. The proximal appendix is normal as it arises from the inferior dilated cecum on Coronal series 5, image 29. But the distal appendix becomes abnormally dilated and terminates at a 6 cm phlegmon in the right lower quadrant which appears inseparable from the dilated rectum. A contained perforation at the tip might be possible although there is no extraluminal gas or free fluid. No dilated small bowel. 2. Presacral stranding related  to fecal impaction. 3. Partially healed T11 vertebral body fracture.  Study discussed by telephone with Dr. Charlotte Crumb on 05/30/2018 at 20:04 .   Electronically Signed   By: Genevie Ann M.D.   On: 05/30/2018 20:13  Assessment/Plan:  84 y.o. female with acute appendicitis with phlegmon and fecal impaction, complicated by pertinent comorbidities including central cord syndrome with significant weakness, chronically bedridden, Essential hypertension, neurogenic bladder and bowel syndromes. Patient physical exam and imaging consistent with appendicitis with phlegmonous process. I oriented the patient about this finding and about the treatment alternatives. Due to the amount and size of the phlegmon, it is recommended to start with IV antibiotic therapy. Patient will need to be admitted, start NPO, bowel rest, IV fluids and IV antibiotics. Patient oriented that if she respond to IV antibiotic therapy, the infection may resolve of concentrate and form and abscess that can be percutaneously drain. Patient also oriented that if she does not respond to IV antibiotic therapy, she might need surgical management. This will be a risky surgery due to the amount of inflammation and the patient age and comorbilities.  Patient also with fecal impaction. I personally disimpacted the patient bedside during rectal exam. Patient was place on right lateral decubitus position. With lubricating jelly, index finger was able to be introduced and digital exam done. No palpable masses or lesion. Patient with decreased anal sphincter tone able to introduce two finger for disimpactation.  A large amount of stool was removed. The rectal vault was dilated. After this procedure patient reports that started to feel better.  Will keep patient hydrated with IVF's. Will try to avoid opioids that makes constipation worse. Will follow with labs, vital signs and physical exam.   All of the above findings and recommendations were  discussed with the patient and her daughter (by phone), and all of patient's and her daughter's questions were answered to their expressed satisfaction.  Arnold Long, MD

## 2018-05-30 NOTE — ED Triage Notes (Signed)
PT to ED via EMS from [facility] at [employer]. PT has had abd swelling for approx 4 days with "mild pain". PT has foley in place d/t unable to control urine and bowels d/t spinal cord injury. PT AO at this time.

## 2018-05-30 NOTE — H&P (Signed)
SURGICAL HISTORY AND PHYSICAL NOTE   HISTORY OF PRESENT ILLNESS (HPI):  84 y.o. female presented to [facility] ED for evaluation of abdominal pain. Patient reports pain started 4 days ago. She reports that pain feels like she needs to pass a gas. Pain is generalized on the abdomen and not localized. Pain does not radiates to other part of the body. There is no aggravating or alleviating factor. Patient reports having regular bowel movements. It is important to mention that she has central cord syndrome and has decreased sensation on lower extremity and pelvis. She does has sensation but decreased. She is able to strain. Denies fever or chills. Denies nausea or diarrhea.  At ED patient had labs and imaging done. CBC shows leukocytosis of 12,000 WBC. CT scan shows severe constipation with fecal impaction. Ct also shows significant inflammation with phlegmonous process around the appendix suspicious of appendicitis with phlegmon.  During my evaluation the patient is on no distress. There is minimal tenderness on palpation.  Surgery is consulted by Dr. Burlene Arnt in this context for evaluation and management of appendicitis.  PAST MEDICAL HISTORY (PMH):      Past Medical History:  Diagnosis Date  . Acute blood loss anemia   . Arthritis   . Cancer ([place])    skin  . Central cord syndrome at C6 level of cervical spinal cord ([place]) 11/29/2017  . Hypertension   . Protein-calorie malnutrition, severe ([place]) 01/24/2018  . S/P insertion of IVC (inferior vena caval) filter 01/24/2018  . 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([place])      PAST SURGICAL HISTORY ([place]):       Past Surgical History:  Procedure Laterality Date  . ANTERIOR CERVICAL DECOMP/DISCECTOMY FUSION N/A 11/29/2017   Procedure: Cervical five-six, six-seven Anterior Cervical Decompression Fusion;  Surgeon: Judith Part, MD;  Location: [place];  Service: Neurosurgery;  Laterality: N/A;  Cervical five-six, six-seven Anterior Cervical Decompression Fusion  .  CATARACT EXTRACTION    . IR IVC FILTER PLMT / S&I /IMG GUID/MOD SED  12/08/2017     MEDICATIONS:         Prior to Admission medications   Medication Sig Start Date End Date Taking? Authorizing Provider  acetaminophen (TYLENOL) 325 MG tablet Take 650 mg by mouth every 6 (six) hours.  01/20/18  Yes [provider]  alum & mag hydroxide-simeth (MAALOX PLUS) 400-400-40 MG/5ML suspension Take 30 mLs by mouth every 4 (four) hours as needed for indigestion.   Yes [provider]  amLODipine (NORVASC) 10 MG tablet Take 10 mg by mouth daily after supper.    Yes [provider]  calcium carbonate (TUMS EX) 750 MG chewable tablet Chew 1 tablet by mouth 3 (three) times daily. 02/10/18  Yes [provider]  Capsaicin 0.1 % CREA Apply 1 application topically 2 (two) times daily. Apply to left shoulder, bilateral hands, and knees   Yes [provider]  DULoxetine (CYMBALTA) 60 MG capsule Take 60 mg by mouth daily.  02/01/18  Yes [provider]  gabapentin (NEURONTIN) 400 MG capsule Take 400 mg by mouth every 6 (six) hours. 01/20/18  Yes [provider]  loratadine (CLARITIN) 10 MG tablet Take 10 mg by mouth daily.  01/19/18  Yes [provider]  Multiple Vitamin (MULTIVITAMIN WITH MINERALS) TABS tablet Take 1 tablet by mouth daily.   Yes [provider]  nortriptyline (PAMELOR) 25 MG capsule Take 25 mg by mouth at bedtime. 01/20/18  Yes [provider]  Probiotic Product (RISA-BID PROBIOTIC) TABS  Take 1 tablet by mouth daily.  02/04/18  Yes [provider]  rivaroxaban (XARELTO) 20 MG TABS tablet Take 1 tablet (20 mg total) by mouth daily with supper. 01/18/18  Yes Angiulli, Lavon Paganini, PA-C  Skin Protectants, Misc. (ENDIT EX) Apply topically to bottom / peri-area after preforming peri-care 01/22/18  Yes [provider]  spironolactone (ALDACTONE) 25 MG tablet Take 25 mg by mouth 2 (two)  times daily.  03/08/18  Yes [provider]  tizanidine (ZANAFLEX) 2 MG capsule Take 2 mg by mouth every 8 (eight) hours.  03/03/18  Yes [provider]  Alpha-D-Galactosidase (BEANO) TABS Take 150 mg by mouth 3 (three) times daily with meals as needed (Bloating). Give 2 tabs = 300 mg    [provider]  Amino Acids-Protein Hydrolys (FEEDING SUPPLEMENT, PRO-STAT SUGAR FREE 64,) LIQD Take 30 mLs by mouth 4 (four) times daily.  02/10/18   [provider]  bisacodyl (DULCOLAX) 10 MG suppository Place 10 mg rectally daily as needed for moderate constipation. 01/24/18   [provider]  collagenase (SANTYL) ointment Apply nickel thick amount topically once daily.  Clean left heel wound with normal saline, skin prep periwound, apply santy nickel thick to wound bed and normal saline gauze, cover with allevyn dressing. 02/04/18   [provider]  Glycerin, Adult, 2.1 g SUPP Place 1 suppository rectally daily at 6 (six) AM. 01/19/18   Angiulli, Lavon Paganini, PA-C  hydrocortisone cream 1 % Apply 1 application topically 4 (four) times daily as needed for itching.    [provider]  NON FORMULARY Diet Type:  Regular - NO BEEF, PORK OR DAIRY PRODUCTS    [provider]  Ostomy Supplies (SKIN PREP WIPES) MISC Apply to both heals daily 01/20/18   [provider]  oxyCODONE (OXY IR/ROXICODONE) 5 MG immediate release tablet Take 0.5 tablets (2.5 mg total) by mouth every 12 hours as needed for severe pain. 03/29/18   Medina-Vargas, Monina C, NP  sodium chloride (OCEAN) 0.65 % SOLN nasal spray Place 2 sprays into both nostrils 3 (three) times daily as needed for congestion.    [provider]     ALLERGIES:       Allergies  Allergen Reactions  . Other Swelling  . Lisinopril-Hydrochlorothiazide Swelling    Swelling of the tongue  . Beef-Derived Products Swelling    Facial and lip swelling (due to tick  bite)  . Dairy Aid [Lactase] Swelling  . Lisinopril-Hydrochlorothiazide Swelling    Tongue swelling  . Pork-Derived Products Swelling     SOCIAL HISTORY:  Social History        Socioeconomic History  . Marital status: Married    Spouse name: Not on file  . Number of children: Not on file  . Years of education: Not on file  . Highest education level: Not on file  Occupational History  . Not on file  Social Needs  . Financial resource strain: Not on file  . Food insecurity:    Worry: Not on file    Inability: Not on file  . Transportation needs:    Medical: Not on file    Non-medical: Not on file  Tobacco Use  . Smoking status: Never Smoker  . Smokeless tobacco: Never Used  Substance and Sexual Activity  . Alcohol use: Never    Frequency: Never  . Drug use: Never  . Sexual activity: Not on file  Lifestyle  . Physical activity:    Days per  week: Not on file    Minutes per session: Not on file  . Stress: Not on file  Relationships  . Social connections:    Talks on phone: Not on file    Gets together: Not on file    Attends religious service: Not on file    Active member of club or organization: Not on file    Attends meetings of clubs or organizations: Not on file    Relationship status: Not on file  . Intimate partner violence:    Fear of current or ex partner: Not on file    Emotionally abused: Not on file    Physically abused: Not on file    Forced sexual activity: Not on file  Other Topics Concern  . Not on file  Social History Narrative   ** Merged History Encounter **        The patient currently resides (home / rehab facility / nursing home): nursing home The patient normally is (ambulatory / bedbound): bedbound   FAMILY HISTORY:       Family History  Problem Relation Age of Onset  . Breast cancer Maternal Aunt 75     REVIEW OF SYSTEMS:  Constitutional: denies weight loss, fever, chills, or sweats   Eyes: denies any other vision changes, history of eye injury  ENT: denies sore throat, hearing problems  Respiratory: denies shortness of breath, wheezing  Cardiovascular: denies chest pain, palpitations  Gastrointestinal: positive abdominal pain, denies nausea or vomiting  Genitourinary: denies burning with urination or urinary frequency. Chronic foley Musculoskeletal: denies any other joint pains or cramps  Skin: denies any other rashes or skin discolorations  Neurological: denies any other headache, dizziness, Positive for weakness (chronic) Psychiatric: denies any other depression, anxiety   All other review of systems were negative   VITAL SIGNS:  Temp:  [98.1 F (36.7 C)] 98.1 F (36.7 C) (04/28 1753) Pulse Rate:  [94-98] 94 (04/28 2001) Resp:  [13-18] 13 (04/28 2001) BP: (126-129)/(77-85) 126/77 (04/28 2001) SpO2:  [97 %] 97 % (04/28 2001) Weight:  [75.3 kg] 75.3 kg (04/28 1755)     Height: 5\' 6"  (167.6 cm) Weight: 75.3 kg BMI (Calculated): 26.81    PHYSICAL EXAM:  Constitutional:  -- Normal body habitus  -- Awake, alert, and oriented x3  Eyes:  -- Pupils equally round and reactive to light  -- No scleral icterus  Ear, nose, and throat:  -- No jugular venous distension  Pulmonary:  -- No crackles  -- Equal breath sounds bilaterally -- Breathing non-labored at rest Cardiovascular:  -- S1, S2 present  -- No pericardial rubs Gastrointestinal:  -- Abdomen soft, minimal tender on palpation in all quadrants, moderate-distended, tympanic, no guarding or rebound tenderness -- No abdominal masses appreciated, pulsatile or otherwise  Musculoskeletal and Integumentary:  -- Wounds or skin discoloration: left buttock grade one pressure point -- Extremities:  Bilateral pitting edema +1 Neurologic: motor weakness 1/5 in all extremities   Labs:  CBC Latest Ref Rng & Units 05/30/2018 04/14/2018 02/20/2018  WBC 4.0 - 10.5 K/uL 12.7(H) 6.4 10.2  Hemoglobin 12.0 - 15.0  g/dL 12.5 11.1(L) 12.3  Hematocrit 36.0 - 46.0 % 38.2 34.2(L) 40.0  Platelets 150 - 400 K/uL 472(H) 410(H) 524(H)   CMP Latest Ref Rng & Units 05/30/2018 05/30/2018 04/14/2018  Glucose 70 - 99 mg/dL 116(H) - 88  BUN 8 - 23 mg/dL 15 - 15  Creatinine 0.44 - 1.00 mg/dL 0.45 - 0.41(L)  Sodium 135 - 145 mmol/L  134(L) - 137  Potassium 3.5 - 5.1 mmol/L 4.2 - 4.0  Chloride 98 - 111 mmol/L 96(L) - 100  CO2 22 - 32 mmol/L 26 - 27  Calcium 8.9 - 10.3 mg/dL 8.8(L) - 9.0  Total Protein 6.5 - 8.1 g/dL 6.4(L) 6.5 -  Total Bilirubin 0.3 - 1.2 mg/dL 1.0 - -  Alkaline Phos 38 - 126 U/L 118 - -  AST 15 - 41 U/L 24 - -  ALT 0 - 44 U/L 17 - -    Imaging studies:  EXAM: CT ABDOMEN AND PELVIS WITH CONTRAST  TECHNIQUE: Multidetector CT imaging of the abdomen and pelvis was performed using the standard protocol following bolus administration of intravenous contrast.  CONTRAST: 141mL OMNIPAQUE IOHEXOL 300 MG/ML SOLN  COMPARISON: CT Chest, Abdomen, and Pelvis 11/28/2017  FINDINGS: Lower chest: Left lower lobe medial basal segment atelectasis. Mild right costophrenic angle atelectasis. Borderline to mild cardiomegaly. No pericardial or pleural effusion.  Hepatobiliary: Negative liver and gallbladder.  Pancreas: Negative.  Spleen: Negative.  Adrenals/Urinary Tract: Normal adrenal glands.  Bilateral renal enhancement and contrast excretion is symmetric and within normal limits. Simple appearing bilateral renal cysts. The urinary bladder is decompressed by a Foley catheter.  Stomach/Bowel: There is a 9 centimeter stool ball in the rectum and some incontinence of stool distally. There is perirectal stranding. The sigmoid colon is redundant with a retained gas and low-density stool, but is nondilated. Similar gas and low-density stool retained throughout the more proximal colon. The hepatic flexure is redundant. The cecum is dilated up to about 7 centimeters diameter. The terminal  ileum is gas-filled but nondilated on series 2, image 58.  Best seen on coronal images series 5, images 29-51 the appendix arises along the inferior surface of the dilated cecum near the midline and initially appears normal (images 32-35), but as it tracks laterally the calms abnormally dilated and appears to contain fluid (14 millimeters diameter image 42). At the tip of the appendix there is of 6 centimeter phlegmon like area which is inseparable from the proximal right lateral wall of the rectum (coronal image 51 and series 2, image 59. This is also inseparable from the right gonadal vein, although previously both ovaries were diminutive.  No free air or extraluminal gas identified. No free fluid identified.  Oral contrast was administered and has reached the distal small bowel but not yet the terminal ileum. No dilated small bowel. Small gastric hiatal hernia. Otherwise negative stomach.  Vascular/Lymphatic: IVC filter in place. Diminutive IVC. Aortoiliac calcified atherosclerosis. Major arterial structures are patent with mild generalized arterial tortuosity. Portal venous system is patent.  No lymphadenopathy.  Reproductive: Stable with mild uterine fibroid related calcification.  Other: No pelvic free fluid, but presacral stranding.  Musculoskeletal: Partially healed T11 vertebral body fracture (series 6, image 67). Mild chronic lower lumbar spondylolisthesis. Hip joint degeneration. No new osseous abnormality identified.  IMPRESSION: 1. Highly suspicious for Acute Appendicitis superimposed on fecal impaction with dilated cecum and colon with retained stool throughout. The proximal appendix is normal as it arises from the inferior dilated cecum on Coronal series 5, image 29. But the distal appendix becomes abnormally dilated and terminates at a 6 cm phlegmon in the right lower quadrant which appears inseparable from the dilated rectum. A contained  perforation at the tip might be possible although there is no extraluminal gas or free fluid. No dilated small bowel. 2. Presacral stranding related to fecal impaction. 3. Partially healed T11 vertebral body fracture.  Study  discussed by telephone with Dr. Charlotte Crumb on 05/30/2018 at 20:04 .   Electronically Signed By: Genevie Ann M.D. On: 05/30/2018 20:13  Assessment/Plan:  84 y.o. female with acute appendicitis with phlegmon and fecal impaction, complicated by pertinent comorbidities including central cord syndrome with significant weakness, chronically bedridden, Essential hypertension, neurogenic bladder and bowel syndromes. Patient physical exam and imaging consistent with appendicitis with phlegmonous process. I oriented the patient about this finding and about the treatment alternatives. Due to the amount and size of the phlegmon, it is recommended to start with IV antibiotic therapy. Patient will need to be admitted, start NPO, bowel rest, IV fluids and IV antibiotics. Patient oriented that if she respond to IV antibiotic therapy, the infection may resolve of concentrate and form and abscess that can be percutaneously drain. Patient also oriented that if she does not respond to IV antibiotic therapy, she might need surgical management. This will be a risky surgery due to the amount of inflammation and the patient age and comorbilities.  Patient also with fecal impaction. I personally disimpacted the patient bedside during rectal exam. Patient was place on right lateral decubitus position. With lubricating jelly, index finger was able to be introduced and digital exam done. No palpable masses or lesion. Patient with decreased anal sphincter tone able to introduce two finger for disimpactation.  A large amount of stool was removed. The rectal vault was dilated. After this procedure patient reports that started to feel better.  Will keep patient hydrated with IVF's. Will try to avoid  opioids that makes constipation worse. Will follow with labs, vital signs and physical exam.  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 Since she is going to be NPO will start on IV anticoagulation drip. Patient allergic to pork derived products. Will consult pharmacy for management of Argatroban drip that is a short acting anticoagulation in case patient needs surgery or any interventional procedure.   All of the above findings and recommendations were discussed with the patient and her daughter (by phone), and all of patient's and her daughter's questions were answered to their expressed satisfaction.  Arnold Long, MD

## 2018-05-30 NOTE — ED Notes (Signed)
PT and surgeon spoke to pts daughter

## 2018-05-30 NOTE — ED Notes (Signed)
Dr. Burlene Arnt made aware of troponin of 0.03, no new order at this time

## 2018-05-30 NOTE — ED Notes (Signed)
ED TO INPATIENT HANDOFF REPORT  ED Nurse Name and Phone #: Ziquan Fidel [phone]  S Name/Age/Gender Sherry [name] 84 y.o. female Room/Bed: ED25A/ED25A  Code Status   Code Status: Full Code  Home/SNF/Other Nursing Home Patient oriented to: self, place, time and situation Is this baseline? Yes   Triage Complete: Triage complete  Chief Complaint Abd Pain  Triage Note PT to ED via EMS from [facility]. PT has had abd swelling for approx 4 days with "mild pain". PT has foley in place d/t unable to control urine and bowels d/t spinal cord injury. PT AO at this time.    Allergies Allergies  Allergen Reactions  . Other Swelling  . Lisinopril-Hydrochlorothiazide Swelling    Swelling of the tongue  . Beef-Derived Products Swelling    Facial and lip swelling (due to tick bite)  . Dairy Aid [Lactase] Swelling  . Lisinopril-Hydrochlorothiazide Swelling    Tongue swelling  . Pork-Derived Products Swelling    Level of Care/Admitting Diagnosis ED Disposition    ED Disposition Condition Comment   Admit  Hospital Area: [facility] [[id number]]  Level of Care: Med-Surg [16]  Covid Evaluation: N/A  Diagnosis: Acute appendicitis with appendiceal abscess [[id number]]  Admitting Physician: Herbert Pun [[id number]]  Attending Physician: Herbert Pun [[id number]]  Estimated length of stay: 3 - 4 days  Certification:: I certify this patient will need inpatient services for at least 2 midnights  PT Class (Do Not Modify): Inpatient [101]  PT Acc Code (Do Not Modify): Private [1]       B Medical/Surgery History Past Medical History:  Diagnosis Date  . Acute blood loss anemia   . Arthritis   . Cancer ([place])    skin  . Central cord syndrome at C6 level of cervical spinal cord ([place]) 11/29/2017  . Hypertension   . Protein-calorie malnutrition, severe ([place]) 01/24/2018  . S/P insertion of IVC (inferior vena caval) filter 01/24/2018  . 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[facility])     Past Surgical History:  Procedure Laterality Date  . ANTERIOR CERVICAL DECOMP/DISCECTOMY FUSION N/A 11/29/2017   Procedure: Cervical five-six, six-seven Anterior Cervical Decompression Fusion;  Surgeon: Judith Part, MD;  Location: [facility];  Service: Neurosurgery;  Laterality: N/A;  Cervical five-six, six-seven Anterior Cervical Decompression Fusion  . CATARACT EXTRACTION    . IR IVC FILTER PLMT / S&I /IMG GUID/MOD SED  12/08/2017     A IV Location/Drains/Wounds Patient Lines/Drains/Airways Status   Active Line/Drains/Airways    Name:   Placement date:   Placement time:   Site:   Days:   Peripheral IV 01/14/18 Left;Posterior Forearm   01/14/18    1217    Forearm   136   Peripheral IV 05/30/18 Left Wrist   05/30/18    1530    Wrist   less than 1   Incision (Closed) 11/29/17 Neck   11/29/17    1709     182   Incision (Closed) 12/08/17 Groin Left   12/08/17    1707     173   Pressure Injury 12/07/17 Deep Tissue Injury - Purple or maroon localized area of discolored intact skin or blood-filled blister due to damage of underlying soft tissue from pressure and/or shear. oval reddened area quarter sized rt out aspect of heel   12/07/17    1830     174   Wound / Incision (Open or Dehisced) 12/05/17 Laceration Elbow Left;Posterior sutures   12/05/17    1230    Elbow  176          Intake/Output Last 24 hours No intake or output data in the 24 hours ending 05/30/18 2211  Labs/Imaging Results for orders placed or performed during the hospital encounter of 05/30/18 (from the past 48 hour(s))  CBC with Differential     Status: Abnormal   Collection Time: 05/30/18  5:58 PM  Result Value Ref Range   WBC 12.7 (H) 4.0 - 10.5 K/uL   RBC 4.42 3.87 - 5.11 MIL/uL   Hemoglobin 12.5 12.0 - 15.0 g/dL   HCT 38.2 36.0 - 46.0 %   MCV 86.4 80.0 - 100.0 fL   MCH 28.3 26.0 - 34.0 pg   MCHC 32.7 30.0 - 36.0 g/dL   RDW 14.6 11.5 - 15.5 %   Platelets 472 (H) 150 - 400 K/uL   nRBC 0.0 0.0 - 0.2 %    Neutrophils Relative % 67 %   Neutro Abs 8.5 (H) 1.7 - 7.7 K/uL   Lymphocytes Relative 25 %   Lymphs Abs 3.2 0.7 - 4.0 K/uL   Monocytes Relative 6 %   Monocytes Absolute 0.8 0.1 - 1.0 K/uL   Eosinophils Relative 2 %   Eosinophils Absolute 0.2 0.0 - 0.5 K/uL   Basophils Relative 0 %   Basophils Absolute 0.0 0.0 - 0.1 K/uL   Immature Granulocytes 0 %   Abs Immature Granulocytes 0.04 0.00 - 0.07 K/uL    Comment: Performed at [facility], [address] [place], [place] [address]  Comprehensive metabolic panel     Status: Abnormal   Collection Time: 05/30/18  5:58 PM  Result Value Ref Range   Sodium 134 (L) 135 - 145 mmol/L   Potassium 4.2 3.5 - 5.1 mmol/L   Chloride 96 (L) 98 - 111 mmol/L   CO2 26 22 - 32 mmol/L   Glucose, Bld 116 (H) 70 - 99 mg/dL   BUN 15 8 - 23 mg/dL   Creatinine, Ser 0.45 0.44 - 1.00 mg/dL   Calcium 8.8 (L) 8.9 - 10.3 mg/dL   Total Protein 6.4 (L) 6.5 - 8.1 g/dL   Albumin 3.3 (L) 3.5 - 5.0 g/dL   AST 24 15 - 41 U/L   ALT 17 0 - 44 U/L   Alkaline Phosphatase 118 38 - 126 U/L   Total Bilirubin 1.0 0.3 - 1.2 mg/dL   GFR calc non Af Amer >60 >60 mL/min   GFR calc Af Amer >60 >60 mL/min   Anion gap 12 5 - 15    Comment: Performed at [facility], [address] [place], [place] [address]  Urinalysis, Complete w Microscopic     Status: Abnormal   Collection Time: 05/30/18  5:58 PM  Result Value Ref Range   Color, Urine AMBER (A) YELLOW    Comment: BIOCHEMICALS MAY BE AFFECTED BY COLOR   APPearance TURBID (A) CLEAR   Specific Gravity, Urine 1.021 1.005 - 1.030   pH 8.0 5.0 - 8.0   Glucose, UA NEGATIVE NEGATIVE mg/dL   Hgb urine dipstick NEGATIVE NEGATIVE   Bilirubin Urine NEGATIVE NEGATIVE   Ketones, ur NEGATIVE NEGATIVE mg/dL   Protein, ur 30 (A) NEGATIVE mg/dL   Nitrite POSITIVE (A) NEGATIVE   Leukocytes,Ua SMALL (A) NEGATIVE   RBC / HPF 0-5 0 - 5 RBC/hpf   WBC, UA 0-5 0 - 5 WBC/hpf   Bacteria, UA FEW (A) NONE SEEN   Squamous  Epithelial / LPF 0-5 0 - 5   Mucus  PRESENT    Amorphous Crystal PRESENT    Triple Phosphate Crystal PRESENT     Comment: Performed at [facility], [address] [place], [place] [address]  Troponin I - Once     Status: Abnormal   Collection Time: 05/30/18  5:58 PM  Result Value Ref Range   Troponin I 0.03 (HH) <0.03 ng/mL    Comment: CRITICAL RESULT CALLED TO, READ BACK BY AND VERIFIED WITH Lakeyta Vandenheuvel 05/30/18 1836 KLW Performed at [facility], [address] [place], [place] [address]   Lipase, blood     Status: None   Collection Time: 05/30/18  5:58 PM  Result Value Ref Range   Lipase 29 11 - 51 U/L    Comment: Performed at [facility], [address] [place],  [address]   Ct Abdomen Pelvis W Contrast  Result Date: 05/30/2018 CLINICAL DATA:  84 year old female with abdominal swelling for 4 days and pain. History of spinal cord injury in October due to fall, T11 vertebral fracture. EXAM: CT ABDOMEN AND PELVIS WITH CONTRAST TECHNIQUE: Multidetector CT imaging of the abdomen and pelvis was performed using the standard protocol following bolus administration of intravenous contrast. CONTRAST:  159mL OMNIPAQUE IOHEXOL 300 MG/ML  SOLN COMPARISON:  CT Chest, Abdomen, and Pelvis 11/28/2017 FINDINGS: Lower chest: Left lower lobe medial basal segment atelectasis. Mild right costophrenic angle atelectasis. Borderline to mild cardiomegaly. No pericardial or pleural effusion. Hepatobiliary: Negative liver and gallbladder. Pancreas: Negative. Spleen: Negative. Adrenals/Urinary Tract: Normal adrenal glands. Bilateral renal enhancement and contrast excretion is symmetric and within normal limits. Simple appearing bilateral renal cysts. The urinary bladder is decompressed by a Foley catheter. Stomach/Bowel: There is a 9 centimeter stool ball in the rectum and some incontinence of stool distally. There is perirectal stranding. The sigmoid colon is redundant with a  retained gas and low-density stool, but is nondilated. Similar gas and low-density stool retained throughout the more proximal colon. The hepatic flexure is redundant. The cecum is dilated up to about 7 centimeters diameter. The terminal ileum is gas-filled but nondilated on series 2, image 58. Best seen on coronal images series 5, images 29-51 the appendix arises along the inferior surface of the dilated cecum near the midline and initially appears normal (images 32-35), but as it tracks laterally the calms abnormally dilated and appears to contain fluid (14 millimeters diameter image 42). At the tip of the appendix there is of 6 centimeter phlegmon like area which is inseparable from the proximal right lateral wall of the rectum (coronal image 51 and series 2, image 59. This is also inseparable from the right gonadal vein, although previously both ovaries were diminutive. No free air or extraluminal gas identified. No free fluid identified. Oral contrast was administered and has reached the distal small bowel but not yet the terminal ileum. No dilated small bowel. Small gastric hiatal hernia. Otherwise negative stomach. Vascular/Lymphatic: IVC filter in place. Diminutive IVC. Aortoiliac calcified atherosclerosis. Major arterial structures are patent with mild generalized arterial tortuosity. Portal venous system is patent. No lymphadenopathy. Reproductive: Stable with mild uterine fibroid related calcification. Other: No pelvic free fluid, but presacral stranding. Musculoskeletal: Partially healed T11 vertebral body fracture (series 6, image 67). Mild chronic lower lumbar spondylolisthesis. Hip joint degeneration. No new osseous abnormality identified. IMPRESSION: 1. Highly suspicious for Acute Appendicitis superimposed on fecal impaction with dilated cecum and colon with retained stool throughout. The proximal appendix is normal as it arises from the inferior dilated cecum on Coronal  series 5, image 29. But the  distal appendix becomes abnormally dilated and terminates at a 6 cm phlegmon in the right lower quadrant which appears inseparable from the dilated rectum. A contained perforation at the tip might be possible although there is no extraluminal gas or free fluid. No dilated small bowel. 2. Presacral stranding related to fecal impaction. 3. Partially healed T11 vertebral body fracture. Study discussed by telephone with Dr. Charlotte Crumb on 05/30/2018 at 20:04 . Electronically Signed   By: Genevie Ann M.D.   On: 05/30/2018 20:13    Pending Labs Unresulted Labs (From admission, onward)    Start     Ordered   05/31/18 9528  Basic metabolic panel  Tomorrow morning,   STAT     05/30/18 2208   05/31/18 0500  Phosphorus  Tomorrow morning,   STAT     05/30/18 2208   05/31/18 0500  Magnesium  Tomorrow morning,   STAT     05/30/18 2208   05/31/18 0500  CBC  Tomorrow morning,   STAT     05/30/18 2208   05/31/18 0500  APTT  Tomorrow morning,   STAT     05/30/18 2208   05/31/18 0500  Protime-INR  Tomorrow morning,   STAT     05/30/18 2208   05/30/18 2211  Protime-INR  Once-Timed,   STAT     05/30/18 2210   05/30/18 2211  Hepatic function panel  Once-Timed,   STAT     05/30/18 2210   05/30/18 2210  APTT  Once-Timed,   STAT     05/30/18 2210          Vitals/Pain Today's Vitals   05/30/18 1754 05/30/18 1755 05/30/18 2001 05/30/18 2200  BP: 129/85  126/77 125/77  Pulse: 98  94 (!) 101  Resp: 18  13 15   Temp:      TempSrc:      SpO2: 97%  97% 94%  Weight:  75.3 kg    Height:  5\' 6"  (1.676 m)    PainSc: 6        Isolation Precautions No active isolations  Medications Medications  amLODipine (NORVASC) tablet 10 mg (has no administration in time range)  spironolactone (ALDACTONE) tablet 25 mg (has no administration in time range)  DULoxetine (CYMBALTA) DR capsule 60 mg (has no administration in time range)  nortriptyline (PAMELOR) capsule 25 mg (has no administration in time range)  alum & mag  hydroxide-simeth (MAALOX PLUS) 400-400-40 MG/5ML suspension 30 mL (has no administration in time range)  bisacodyl (DULCOLAX) suppository 10 mg (has no administration in time range)  Glycerin (Adult) 2.1 g suppository 1 suppository (has no administration in time range)  gabapentin (NEURONTIN) capsule 400 mg (has no administration in time range)  tizanidine (ZANAFLEX) capsule 2 mg (has no administration in time range)  loratadine (CLARITIN) tablet 10 mg (has no administration in time range)  sodium chloride (OCEAN) 0.65 % nasal spray 2 spray (has no administration in time range)  Capsaicin 0.1 % CREA 1 application (has no administration in time range)  collagenase (SANTYL) ointment (has no administration in time range)  0.9 %  sodium chloride infusion (has no administration in time range)  piperacillin-tazobactam (ZOSYN) IVPB 3.375 g (has no administration in time range)  acetaminophen (TYLENOL) tablet 650 mg (has no administration in time range)    Or  acetaminophen (TYLENOL) suppository 650 mg (has no administration in time range)  HYDROcodone-acetaminophen (NORCO/VICODIN) 5-325 MG per tablet 1-2 tablet (  has no administration in time range)  morphine 2 MG/ML injection 2 mg (has no administration in time range)  ondansetron (ZOFRAN-ODT) disintegrating tablet 4 mg (has no administration in time range)    Or  ondansetron (ZOFRAN) injection 4 mg (has no administration in time range)  famotidine (PEPCID) IVPB 20 mg premix (has no administration in time range)  iohexol (OMNIPAQUE) 240 MG/ML injection 50 mL (50 mLs Oral Contrast Given 05/30/18 1823)  iohexol (OMNIPAQUE) 300 MG/ML solution 100 mL (100 mLs Intravenous Contrast Given 05/30/18 1936)  piperacillin-tazobactam (ZOSYN) IVPB 3.375 g (0 g Intravenous Stopped 05/30/18 2146)    Mobility manual wheelchair Moderate fall risk   Focused Assessments Abdomen distended, pt states passing BM as usual. PT has no control over  BM   R Recommendations: See Admitting Provider Note  Report given to:   Additional Notes: PT was disimpacted by surgeon

## 2018-05-31 LAB — CBC
HCT: 38.3 % (ref 36.0–46.0)
Hemoglobin: 12.5 g/dL (ref 12.0–15.0)
MCH: 28.5 pg (ref 26.0–34.0)
MCHC: 32.6 g/dL (ref 30.0–36.0)
MCV: 87.2 fL (ref 80.0–100.0)
Platelets: 463 10*3/uL — ABNORMAL HIGH (ref 150–400)
RBC: 4.39 MIL/uL (ref 3.87–5.11)
RDW: 14.6 % (ref 11.5–15.5)
WBC: 11.1 10*3/uL — ABNORMAL HIGH (ref 4.0–10.5)
nRBC: 0 % (ref 0.0–0.2)

## 2018-05-31 LAB — BASIC METABOLIC PANEL
Anion gap: 9 (ref 5–15)
BUN: 12 mg/dL (ref 8–23)
CO2: 28 mmol/L (ref 22–32)
Calcium: 8.7 mg/dL — ABNORMAL LOW (ref 8.9–10.3)
Chloride: 98 mmol/L (ref 98–111)
Creatinine, Ser: 0.41 mg/dL — ABNORMAL LOW (ref 0.44–1.00)
GFR calc Af Amer: >60 mL/min (ref >60)
GFR calc non Af Amer: >60 mL/min (ref >60)
Glucose, Bld: 99 mg/dL (ref 70–99)
Potassium: 4.3 mmol/L (ref 3.5–5.1)
Sodium: 135 mmol/L (ref 135–145)

## 2018-05-31 LAB — HEPATIC FUNCTION PANEL
ALT: 17 U/L (ref 0–44)
AST: 20 U/L (ref 15–41)
Albumin: 3.1 g/dL — ABNORMAL LOW (ref 3.5–5.0)
Alkaline Phosphatase: 118 U/L (ref 38–126)
Bilirubin, Direct: 0.2 mg/dL (ref 0.0–0.2)
Indirect Bilirubin: 1 mg/dL — ABNORMAL HIGH (ref 0.3–0.9)
Total Bilirubin: 1.2 mg/dL (ref 0.3–1.2)
Total Protein: 6.5 g/dL (ref 6.5–8.1)

## 2018-05-31 LAB — MAGNESIUM: Magnesium: 2.2 mg/dL (ref 1.7–2.4)

## 2018-05-31 LAB — APTT
aPTT: 43 seconds — ABNORMAL HIGH (ref 24–36)
aPTT: 82 seconds — ABNORMAL HIGH (ref 24–36)

## 2018-05-31 LAB — MRSA PCR SCREENING: MRSA by PCR: NEGATIVE

## 2018-05-31 LAB — PROTIME-INR
INR: 1.1 (ref 0.8–1.2)
Prothrombin Time: 13.8 seconds (ref 11.4–15.2)

## 2018-05-31 LAB — PHOSPHORUS: Phosphorus: 4.9 mg/dL — ABNORMAL HIGH (ref 2.5–4.6)

## 2018-05-31 MED ORDER — ARGATROBAN 50 MG/50ML IV SOLN
0.8000 ug/kg/min | INTRAVENOUS | Status: DC
  Administered 2018-05-31 – 2018-06-01 (×2): 1 ug/kg/min via INTRAVENOUS
  Administered 2018-06-01 – 2018-06-02 (×2): 0.9 ug/kg/min via INTRAVENOUS
  Filled 2018-05-31 (×5): qty 50

## 2018-05-31 MED ORDER — SODIUM CHLORIDE 0.9% FLUSH
10.0000 mL | Freq: Two times a day (BID) | INTRAVENOUS | Status: DC
  Administered 2018-05-31 – 2018-06-01 (×2): 10 mL

## 2018-05-31 MED ORDER — MINERAL OIL PO OIL
30.0000 mL | TOPICAL_OIL | Freq: Every day | ORAL | Status: DC
  Administered 2018-05-31 – 2018-06-01 (×2): 30 mL via ORAL
  Filled 2018-05-31 (×4): qty 30

## 2018-05-31 MED ORDER — SODIUM CHLORIDE 0.9% FLUSH: 10.0000 mL | INTRAVENOUS | Status: DC | PRN

## 2018-05-31 MED ORDER — POLYETHYLENE GLYCOL 3350 17 G PO PACK
17.0000 g | PACK | Freq: Every day | ORAL | Status: DC
  Administered 2018-05-31 – 2018-06-01 (×2): 17 g via ORAL
  Filled 2018-05-31 (×2): qty 1

## 2018-05-31 NOTE — NC FL2 (Signed)
[place] LEVEL OF CARE SCREENING TOOL     IDENTIFICATION  Patient Name: Sherry [name] Birthdate: 05-31-1934 Sex: female Admission Date (Current Location): 05/30/2018  [place] and [place] Number:  Engineering geologist and Address:  [facility], [address] [place], [place] [address]      Provider Number: [id number]  Attending Physician Name and Address:  Herbert Pun, MD  Relative Name and Phone Number:       Current Level of Care: Hospital Recommended Level of Care: [place] Prior Approval Number:    Date Approved/Denied:   PASRR Number:    Discharge Plan: SNF    Current Diagnoses: Patient Active Problem List   Diagnosis Date Noted  . Acute appendicitis with appendiceal abscess 05/30/2018  . Hypocalcemia 02/15/2018  . Dysuria 02/15/2018  . Bilateral lower extremity edema 02/11/2018  . Vaginal yeast infection 01/30/2018  . Chest tightness 01/27/2018  . Healthcare-associated pneumonia 01/25/2018  . Pleural effusion, not elsewhere classified 01/25/2018  . Acute deep vein thrombosis (DVT) of left lower extremity ([place]) 01/24/2018  . Acute deep vein thrombosis (DVT) of right lower extremity ([place]) 01/24/2018  . Chronic allergic rhinitis 01/24/2018  . Depression with anxiety 01/24/2018  . UTI due to Klebsiella species 01/24/2018  . Protein-calorie malnutrition, severe ([place]) 01/24/2018  . S/P insertion of IVC (inferior vena caval) filter 01/24/2018  . Reactive depression   . Hypokalemia   . Leukocytosis   . Essential hypertension   . Trauma   . 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([place])   . Neuropathic pain   . Neurogenic bowel   . Neurogenic bladder   . Benign essential HTN   . Acute blood loss anemia   . Central cord syndrome at C6 level of cervical spinal cord ([place]) 11/29/2017  . Central cord syndrome ([place]) 11/29/2017    Orientation RESPIRATION BLADDER Height & Weight     Self, Place  Normal Continent Weight:  164 lb 14.5 oz (74.8 kg) Height:  5\' 6"  (167.6 cm)  BEHAVIORAL SYMPTOMS/MOOD NEUROLOGICAL BOWEL NUTRITION STATUS  (none) (none) Continent Diet(Clear Liquid )  AMBULATORY STATUS COMMUNICATION OF NEEDS Skin   Extensive Assist Verbally Normal                       Personal Care Assistance Level of Assistance  Bathing, Feeding, Dressing Bathing Assistance: Limited assistance Feeding assistance: Independent Dressing Assistance: Limited assistance     Functional Limitations Info  Sight, Hearing, Speech Sight Info: Adequate Hearing Info: Adequate Speech Info: Adequate    SPECIAL CARE FACTORS FREQUENCY                       Contractures Contractures Info: Not present    Additional Factors Info  Code Status, Allergies Code Status Info: Full Code  Allergies Info: Lisinopril-hydrochlorothiazide, Beef-derived Products, Dairy Aid Lactase, Lisinopril-hydrochlorothiazide, Pork-derived Products           Current Medications (05/31/2018):  This is the current hospital active medication list Current Facility-Administered Medications  Medication Dose Route Frequency Provider Last Rate Last Dose  . 0.9 %  sodium chloride infusion   Intravenous Continuous Herbert Pun, MD   Stopped at 05/31/18 0615  . acetaminophen (TYLENOL) tablet 650 mg  650 mg Oral Q6H PRN Herbert Pun, MD       Or  . acetaminophen (TYLENOL) suppository 650 mg  650 mg Rectal Q6H PRN Herbert Pun, MD      . alum &  mag hydroxide-simeth (MAALOX/MYLANTA) 200-200-20 MG/5ML suspension 30 mL  30 mL Oral Q4H PRN Herbert Pun, MD      . amLODipine (NORVASC) tablet 10 mg  10 mg Oral QPC supper Herbert Pun, MD      . argatroban 1 mg/mL infusion  1 mcg/kg/min Intravenous Continuous Herbert Pun, MD      . bisacodyl (DULCOLAX) suppository 10 mg  10 mg Rectal Daily PRN Herbert Pun, MD      . capsaicin (ZOSTRIX) 5.809 % cream 1 application  1 application  Topical BID Herbert Pun, MD   1 application at 98/33/82 0835  . collagenase (SANTYL) ointment   Topical Daily PRN Herbert Pun, MD      . DULoxetine (CYMBALTA) DR capsule 60 mg  60 mg Oral Daily Herbert Pun, MD   60 mg at 05/31/18 0835  . famotidine (PEPCID) IVPB 20 mg premix  20 mg Intravenous Q12H Herbert Pun, MD 100 mL/hr at 05/31/18 1050 20 mg at 05/31/18 1050  . gabapentin (NEURONTIN) capsule 400 mg  400 mg Oral Q6H Herbert Pun, MD   400 mg at 05/31/18 0835  . Glycerin (Adult) 2.1 g suppository 1 suppository  1 suppository Rectal [id number] Herbert Pun, MD   1 suppository at 05/31/18 0616  . HYDROcodone-acetaminophen (NORCO/VICODIN) 5-325 MG per tablet 1-2 tablet  1-2 tablet Oral Q4H PRN Herbert Pun, MD   2 tablet at 05/31/18 0837  . loratadine (CLARITIN) tablet 10 mg  10 mg Oral Daily Herbert Pun, MD   10 mg at 05/31/18 0835  . morphine 2 MG/ML injection 2 mg  2 mg Intravenous Q4H PRN Herbert Pun, MD      . nortriptyline (PAMELOR) capsule 25 mg  25 mg Oral QHS Herbert Pun, MD   25 mg at 05/31/18 0030  . ondansetron (ZOFRAN-ODT) disintegrating tablet 4 mg  4 mg Oral Q6H PRN Herbert Pun, MD       Or  . ondansetron (ZOFRAN) injection 4 mg  4 mg Intravenous Q6H PRN Herbert Pun, MD      . piperacillin-tazobactam (ZOSYN) IVPB 3.375 g  3.375 g Intravenous Q8H Herbert Pun, MD 12.5 mL/hr at 05/31/18 1000    . sodium chloride (OCEAN) 0.65 % nasal spray 2 spray  2 spray Each Nare TID PRN Herbert Pun, MD      . spironolactone (ALDACTONE) tablet 25 mg  25 mg Oral BID Herbert Pun, MD   25 mg at 05/31/18 0836  . tiZANidine (ZANAFLEX) tablet 2 mg  2 mg Oral Q8H Herbert Pun, MD   2 mg at 05/31/18 7673     Discharge Medications: Please see discharge summary for a list of discharge medications.  Relevant Imaging Results:  Relevant Lab  Results:   Additional Information    Demarques Pilz  Louretta Shorten, LCSWA

## 2018-05-31 NOTE — Consult Note (Signed)
[name] NAME: Sherry [name]    MR#:  [id number]  DATE OF BIRTH:  July 31, 1934  DATE OF ADMISSION:  05/30/2018  PRIMARY CARE PHYSICIAN: Glendon Axe, MD   REQUESTING/REFERRING PHYSICIAN: Dr Windell Moment  CHIEF COMPLAINT:   Medical management requested by surgery  HISTORY OF PRESENT ILLNESS:  Sherry [name]  is a 84 y.o. female with a known history of central cord syndrome, history of DVT status post IVC filter on Xarelto, hypertension, arthritis [place] after she started having abdominal discomfort and was found to have fecal impaction/severe constipation and inflammation around the appendix as noted on the CT abdomen. Patient had fecal this impaction done by surgery currently on IV antibiotics and possible conservative management unless patient requires surgery for appendicitis.  Total medicine was consulted for medical management. 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  PAST MEDICAL HISTORY:   Past Medical History:  Diagnosis Date  . Acute blood loss anemia   . Arthritis   . Cancer ([place])    skin  . Central cord syndrome at C6 level of cervical spinal cord ([place]) 11/29/2017  . Hypertension   . Protein-calorie malnutrition, severe ([place]) 01/24/2018  . S/P insertion of IVC (inferior vena caval) filter 01/24/2018  . 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([place])     PAST SURGICAL HISTOIRY:   Past Surgical History:  Procedure Laterality Date  . ANTERIOR CERVICAL DECOMP/DISCECTOMY FUSION N/A 11/29/2017   Procedure: Cervical five-six, six-seven Anterior Cervical Decompression Fusion;  Surgeon: Judith Part, MD;  Location: [facility];  Service: Neurosurgery;  Laterality: N/A;  Cervical five-six, six-seven Anterior Cervical Decompression Fusion  . CATARACT EXTRACTION    . IR IVC FILTER PLMT / S&I /IMG GUID/MOD SED  12/08/2017    SOCIAL HISTORY:    Social History   Tobacco Use  . Smoking status: Never Smoker  . Smokeless tobacco: Never Used  Substance Use Topics  . Alcohol use: Never    Frequency: Never    FAMILY HISTORY:   Family History  Problem Relation Age of Onset  . Breast cancer Maternal Aunt 75    DRUG ALLERGIES:   Allergies  Allergen Reactions  . Other Swelling  . Lisinopril-Hydrochlorothiazide Swelling    Swelling of the tongue  . Beef-Derived Products Swelling    Facial and lip swelling (due to tick bite)  . Dairy Aid [Lactase] Swelling  . Lisinopril-Hydrochlorothiazide Swelling    Tongue swelling  . Pork-Derived Products Swelling    REVIEW OF SYSTEMS:   Review of Systems  Constitutional: Negative for chills, fever and weight loss.  HENT: Negative for ear discharge, ear pain and nosebleeds.   Eyes: Negative for blurred vision, pain and discharge.  Respiratory: Negative for sputum production, shortness of breath, wheezing and stridor.   Cardiovascular: Negative for chest pain, palpitations, orthopnea and PND.  Gastrointestinal: Positive for constipation. Negative for abdominal pain, diarrhea, nausea and vomiting.  Genitourinary: Negative for frequency and urgency.  Musculoskeletal: Negative for back pain and joint pain.  Neurological: Positive for weakness. Negative for sensory change, speech change and focal weakness.  Psychiatric/Behavioral: Negative for depression and hallucinations. The patient is not nervous/anxious.     MEDICATIONS AT HOME:   Prior to Admission medications   Medication Sig Start Date End Date Taking? Authorizing Provider  acetaminophen (TYLENOL) 325 MG tablet Take 650 mg by mouth every 6 (six) hours.  01/20/18  Yes [provider]  alum & mag hydroxide-simeth (MAALOX PLUS) 400-400-40 MG/5ML suspension Take 30 mLs by mouth every 4 (four) hours as needed for indigestion.   Yes [provider]  amLODipine (NORVASC) 10 MG tablet Take 10 mg by mouth daily  after supper.    Yes [provider]  calcium carbonate (TUMS EX) 750 MG chewable tablet Chew 1 tablet by mouth 3 (three) times daily. 02/10/18  Yes [provider]  Capsaicin 0.1 % CREA Apply 1 application topically 2 (two) times daily. Apply to left shoulder, bilateral hands, and knees   Yes [provider]  DULoxetine (CYMBALTA) 60 MG capsule Take 60 mg by mouth daily.  02/01/18  Yes [provider]  gabapentin (NEURONTIN) 400 MG capsule Take 400 mg by mouth every 6 (six) hours. 01/20/18  Yes [provider]  loratadine (CLARITIN) 10 MG tablet Take 10 mg by mouth daily.  01/19/18  Yes [provider]  Multiple Vitamin (MULTIVITAMIN WITH MINERALS) TABS tablet Take 1 tablet by mouth daily.   Yes [provider]  nortriptyline (PAMELOR) 25 MG capsule Take 25 mg by mouth at bedtime. 01/20/18  Yes [provider]  Probiotic Product (RISA-BID PROBIOTIC) TABS Take 1 tablet by mouth daily.  02/04/18  Yes [provider]  rivaroxaban (XARELTO) 20 MG TABS tablet Take 1 tablet (20 mg total) by mouth daily with supper. 01/18/18  Yes Angiulli, Lavon Paganini, PA-C  Skin Protectants, Misc. (ENDIT EX) Apply topically to bottom / peri-area after preforming peri-care 01/22/18  Yes [provider]  spironolactone (ALDACTONE) 25 MG tablet Take 25 mg by mouth 2 (two) times daily.  03/08/18  Yes [provider]  tizanidine (ZANAFLEX) 2 MG capsule Take 2 mg by mouth every 8 (eight) hours.  03/03/18  Yes [provider]  Alpha-D-Galactosidase (BEANO) TABS Take 150 mg by mouth 3 (three) times daily with meals as needed (Bloating). Give 2 tabs = 300 mg    [provider]  Amino Acids-Protein Hydrolys (FEEDING SUPPLEMENT, PRO-STAT SUGAR FREE 64,) LIQD Take 30 mLs by mouth 4 (four) times daily.  02/10/18   [provider]  bisacodyl (DULCOLAX) 10 MG suppository Place 10 mg rectally daily as needed for moderate  constipation. 01/24/18   [provider]  collagenase (SANTYL) ointment Apply nickel thick amount topically once daily.  Clean left heel wound with normal saline, skin prep periwound, apply santy nickel thick to wound bed and normal saline gauze, cover with allevyn dressing. 02/04/18   [provider]  Glycerin, Adult, 2.1 g SUPP Place 1 suppository rectally daily at 6 (six) AM. 01/19/18   Angiulli, Lavon Paganini, PA-C  hydrocortisone cream 1 % Apply 1 application topically 4 (four) times daily as needed for itching.    [provider]  NON FORMULARY Diet Type:  Regular - NO BEEF, PORK OR DAIRY PRODUCTS    [provider]  Ostomy Supplies (SKIN PREP WIPES) MISC Apply to both heals daily 01/20/18   [provider]  oxyCODONE (OXY IR/ROXICODONE) 5 MG immediate release tablet Take 0.5 tablets (2.5 mg total) by mouth every 12 hours as needed for severe pain. 03/29/18   Medina-Vargas, Monina C, NP  sodium chloride (OCEAN) 0.65 % SOLN nasal spray Place 2 sprays into both nostrils 3 (three) times daily as needed for congestion.    [provider]      VITAL SIGNS:  Blood pressure 123/66, pulse 95, temperature (!) 97.5 F (36.4 C), temperature  source Oral, resp. rate (!) 24, height 5\' 6"  (1.676 m), weight 74.8 kg, SpO2 97 %.  PHYSICAL EXAMINATION:  GENERAL:  84 y.o.-year-old patient lying in the bed with no acute distress. weak EYES: Pupils equal, round, reactive to light and accommodation. No scleral icterus. Extraocular muscles intact.  HEENT: Head atraumatic, normocephalic. Oropharynx and nasopharynx clear.  NECK:  Supple, no jugular venous distention. No thyroid enlargement, no tenderness.  LUNGS: Normal breath sounds bilaterally, no wheezing, rales,rhonchi or crepitation. No use of accessory muscles of respiration.  CARDIOVASCULAR: S1, S2 normal. No murmurs, rubs, or gallops.  ABDOMEN: Soft, nontender,  Mildly distended. Few Bowel sounds present. No  organomegaly or mass.  EXTREMITIES: No pedal edema, cyanosis, or clubbing.  NEUROLOGIC: Cranial nerves II through XII are intact. Decreased Sensation both LE. Gait not checked. --chronic LE weakness PSYCHIATRIC: The patient is alert and oriented x 3.  SKIN: No obvious rash, lesion, or ulcer.   LABORATORY PANEL:   CBC Recent Labs  Lab 05/31/18 0358  WBC 11.1*  HGB 12.5  HCT 38.3  PLT 463*   ------------------------------------------------------------------------------------------------------------------  Chemistries  Recent Labs  Lab 05/31/18 0358  NA 135  K 4.3  CL 98  CO2 28  GLUCOSE 99  BUN 12  CREATININE 0.41*  CALCIUM 8.7*  MG 2.2  AST 20  ALT 17  ALKPHOS 118  BILITOT 1.2   ------------------------------------------------------------------------------------------------------------------  Cardiac Enzymes Recent Labs  Lab 05/30/18 1758  TROPONINI 0.03*   ------------------------------------------------------------------------------------------------------------------  RADIOLOGY:  Ct Abdomen Pelvis W Contrast  Result Date: 05/30/2018 CLINICAL DATA:  84 year old female with abdominal swelling for 4 days and pain. History of spinal cord injury in October due to fall, T11 vertebral fracture. EXAM: CT ABDOMEN AND PELVIS WITH CONTRAST TECHNIQUE: Multidetector CT imaging of the abdomen and pelvis was performed using the standard protocol following bolus administration of intravenous contrast. CONTRAST:  175mL OMNIPAQUE IOHEXOL 300 MG/ML  SOLN COMPARISON:  CT Chest, Abdomen, and Pelvis 11/28/2017 FINDINGS: Lower chest: Left lower lobe medial basal segment atelectasis. Mild right costophrenic angle atelectasis. Borderline to mild cardiomegaly. No pericardial or pleural effusion. Hepatobiliary: Negative liver and gallbladder. Pancreas: Negative. Spleen: Negative. Adrenals/Urinary Tract: Normal adrenal glands. Bilateral renal enhancement and contrast excretion is symmetric  and within normal limits. Simple appearing bilateral renal cysts. The urinary bladder is decompressed by a Foley catheter. Stomach/Bowel: There is a 9 centimeter stool ball in the rectum and some incontinence of stool distally. There is perirectal stranding. The sigmoid colon is redundant with a retained gas and low-density stool, but is nondilated. Similar gas and low-density stool retained throughout the more proximal colon. The hepatic flexure is redundant. The cecum is dilated up to about 7 centimeters diameter. The terminal ileum is gas-filled but nondilated on series 2, image 58. Best seen on coronal images series 5, images 29-51 the appendix arises along the inferior surface of the dilated cecum near the midline and initially appears normal (images 32-35), but as it tracks laterally the calms abnormally dilated and appears to contain fluid (14 millimeters diameter image 42). At the tip of the appendix there is of 6 centimeter phlegmon like area which is inseparable from the proximal right lateral wall of the rectum (coronal image 51 and series 2, image 59. This is also inseparable from the right gonadal vein, although previously both ovaries were diminutive. No free air or extraluminal gas identified. No free fluid identified. Oral contrast was administered and has reached the distal small bowel but not yet the terminal  ileum. No dilated small bowel. Small gastric hiatal hernia. Otherwise negative stomach. Vascular/Lymphatic: IVC filter in place. Diminutive IVC. Aortoiliac calcified atherosclerosis. Major arterial structures are patent with mild generalized arterial tortuosity. Portal venous system is patent. No lymphadenopathy. Reproductive: Stable with mild uterine fibroid related calcification. Other: No pelvic free fluid, but presacral stranding. Musculoskeletal: Partially healed T11 vertebral body fracture (series 6, image 67). Mild chronic lower lumbar spondylolisthesis. Hip joint degeneration. No new  osseous abnormality identified. IMPRESSION: 1. Highly suspicious for Acute Appendicitis superimposed on fecal impaction with dilated cecum and colon with retained stool throughout. The proximal appendix is normal as it arises from the inferior dilated cecum on Coronal series 5, image 29. But the distal appendix becomes abnormally dilated and terminates at a 6 cm phlegmon in the right lower quadrant which appears inseparable from the dilated rectum. A contained perforation at the tip might be possible although there is no extraluminal gas or free fluid. No dilated small bowel. 2. Presacral stranding related to fecal impaction. 3. Partially healed T11 vertebral body fracture. Study discussed by telephone with Dr. Charlotte Crumb on 05/30/2018 at 20:04 . Electronically Signed   By: Genevie Ann M.D.   On: 05/30/2018 20:13    EKG:   Orders placed or performed during the hospital encounter of 05/30/18  . ED EKG  . ED EKG    IMPRESSION AND PLAN:   Sherry [name]  is a 84 y.o. female with a known history of central cord syndrome, history of DVT status post IVC filter on Xarelto, hypertension, arthritis [place] after she started having abdominal discomfort and was found to have fecal impaction/severe constipation and inflammation around the appendix as noted on the CT abdomen.  1. Abdominal pain suspected due to fecal impaction/severe constipation and appendicitis as noted on CT abdomen -management per surgery. Patient is getting conservative management for now -she does not have any cardiac history. Troponin negative. Denies any chest pain. -Patient will be at low to intermediate risk in case she will need surgery -IV fluids, clear liquid diet  2. History of DVT status post IVC filter. Patient had DVT diagnosed in 2019 -she is on Xarelto. However she has allergies to pork hands will be started on IV argatroban  3. Hypertension continue amlodipine  4.  Leukocytosis suspected due to inflammation in the abdomen  5. Central cord syndrome at C6 level chronic    Thank you for the consult will continue to follow.  TOTAL TIME TAKING CARE OF THIS PATIENT: *50* minutes.    Fritzi Mandes M.D on 05/31/2018 at 12:22 PM  Between 7am to 6pm - Pager - [phone]  After 6pm go to [email or web address] - password [email or web address] [facility]  Office  [phone]  CC: Primary care Physician: Glendon Axe, MD

## 2018-05-31 NOTE — Progress Notes (Signed)
[facility] for Argatroban  Indication: history of DVT- on Xarelto PTA w/ Pork allergy  Allergies  Allergen Reactions  . Other Swelling  . Lisinopril-Hydrochlorothiazide Swelling    Swelling of the tongue  . Beef-Derived Products Swelling    Facial and lip swelling (due to tick bite)  . Dairy Aid [Lactase] Swelling  . Lisinopril-Hydrochlorothiazide Swelling    Tongue swelling  . Pork-Derived Products Swelling    Patient Measurements: Height: 5\' 6"  (167.6 cm) Weight: 164 lb 14.5 oz (74.8 kg) IBW/kg (Calculated) : 59.3 Heparin Dosing Weight:    Vital Signs: Temp: 98.1 F (36.7 C) (04/29 2019) Temp Source: Oral (04/29 2019) BP: 125/61 (04/29 2019) Pulse Rate: 93 (04/29 2019)  Labs: Recent Labs    05/30/18 1758 05/31/18 0358 05/31/18 2000  HGB 12.5 12.5  --   HCT 38.2 38.3  --   PLT 472* 463*  --   APTT  --  43* 82*  LABPROT  --  13.8  --   INR  --  1.1  --   CREATININE 0.45 0.41*  --   TROPONINI 0.03*  --   --     Estimated Creatinine Clearance: 57 mL/min (A) (by C-G formula based on SCr of 0.41 mg/dL (L)).   Medical History: Past Medical History:  Diagnosis Date  . Acute blood loss anemia   . Arthritis   . Cancer ([place])    skin  . Central cord syndrome at C6 level of cervical spinal cord ([place]) 11/29/2017  . Hypertension   . Protein-calorie malnutrition, severe ([place]) 01/24/2018  . S/P insertion of IVC (inferior vena caval) filter 01/24/2018  . 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([place])     Medications:  Medications Prior to Admission  Medication Sig Dispense Refill Last Dose  . acetaminophen (TYLENOL) 325 MG tablet Take 650 mg by mouth every 6 (six) hours.    05/30/2018 at 1357  . alum & mag hydroxide-simeth (MAALOX PLUS) 400-400-40 MG/5ML suspension Take 30 mLs by mouth every 4 (four) hours as needed for indigestion.   05/30/2018 at 1403  . amLODipine (NORVASC) 10 MG tablet Take 10 mg by mouth daily after supper.    05/30/2018 at 1618  .  calcium carbonate (TUMS EX) 750 MG chewable tablet Chew 1 tablet by mouth 3 (three) times daily.   05/30/2018 at 1357  . Capsaicin 0.1 % CREA Apply 1 application topically 2 (two) times daily. Apply to left shoulder, bilateral hands, and knees   05/30/2018 at 0901  . DULoxetine (CYMBALTA) 60 MG capsule Take 60 mg by mouth daily.    05/30/2018 at 0901  . gabapentin (NEURONTIN) 400 MG capsule Take 400 mg by mouth every 6 (six) hours.   05/30/2018 at 1357  . loratadine (CLARITIN) 10 MG tablet Take 10 mg by mouth daily.    05/30/2018 at 0901  . Multiple Vitamin (MULTIVITAMIN WITH MINERALS) TABS tablet Take 1 tablet by mouth daily.   05/30/2018 at 0901  . nortriptyline (PAMELOR) 25 MG capsule Take 25 mg by mouth at bedtime.   05/29/2018 at 2033  . Probiotic Product (RISA-BID PROBIOTIC) TABS Take 1 tablet by mouth daily.    05/30/2018 at 0901  . rivaroxaban (XARELTO) 20 MG TABS tablet Take 1 tablet (20 mg total) by mouth daily with supper. 30 tablet  05/30/2018 at 1618  . Skin Protectants, Misc. (ENDIT EX) Apply topically to bottom / peri-area after preforming peri-care   05/30/2018 at 0738  . spironolactone (ALDACTONE) 25 MG tablet  Take 25 mg by mouth 2 (two) times daily.    05/30/2018 at 1700  . tizanidine (ZANAFLEX) 2 MG capsule Take 2 mg by mouth every 8 (eight) hours.    05/30/2018 at 1357  . Alpha-D-Galactosidase (BEANO) TABS Take 150 mg by mouth 3 (three) times daily with meals as needed (Bloating). Give 2 tabs = 300 mg   Unknown at prn  . Amino Acids-Protein Hydrolys (FEEDING SUPPLEMENT, PRO-STAT SUGAR FREE 64,) LIQD Take 30 mLs by mouth 4 (four) times daily.    Unknown at prn  . bisacodyl (DULCOLAX) 10 MG suppository Place 10 mg rectally daily as needed for moderate constipation.   Unknown at prn  . collagenase (SANTYL) ointment Apply nickel thick amount topically once daily.  Clean left heel wound with normal saline, skin prep periwound, apply santy nickel thick to wound bed and normal saline gauze, cover  with allevyn dressing.   Unknown at prn  . Glycerin, Adult, 2.1 g SUPP Place 1 suppository rectally daily at 6 (six) AM.  0 Unknown at prn  . hydrocortisone cream 1 % Apply 1 application topically 4 (four) times daily as needed for itching.   Unknown at prn  . NON FORMULARY Diet Type:  Regular - NO BEEF, PORK OR DAIRY PRODUCTS   Unknown at Unknown time  . Ostomy Supplies (SKIN PREP WIPES) MISC Apply to both heals daily   Unknown at Unknown time  . oxyCODONE (OXY IR/ROXICODONE) 5 MG immediate release tablet Take 0.5 tablets (2.5 mg total) by mouth every 12 hours as needed for severe pain. 14 tablet 0 Unknown at prn  . sodium chloride (OCEAN) 0.65 % SOLN nasal spray Place 2 sprays into both nostrils 3 (three) times daily as needed for congestion.   Unknown at prn    Assessment: Pharmacy consulted to dose argatroban in this 84 year old female with history of DVT.  Pt was on Xarelto at home previously, last dose of xarelto was on 4/28  @ 1600.   Pt has allergy to pork products so heparin is not indicated for this pt.    In order to dose argatroban we need a Child-Pugh score .   Relevant labs have been ordered for 4/29 AM.   Will dose argatroban when INR is resulted.   4/29- Child-Pugh score = 3,    Total bilirubin 1.2, Albumin 3.1, INR 1.1, no ascites or hepatic encephalopathy noted.  APTT 43   Goal of Therapy:  APTT = 50 - 90 seconds   Plan:  Start argatroban drip at 1 mcg/kg/min (wt= 74.8 kg) on 4/29 @ 1600.   Check aPTT in 2 hours. CBC daily  4/29:  APTT @ 2000 = 82 Will continue pt on current rate and recheck aPTT on 4/30 with AM labs.   Sherry [name] D 05/31/2018,9:13 PM

## 2018-05-31 NOTE — Progress Notes (Signed)
Patient with chronic foley in place. Patient tells me it was last replaced on Saturday and it is usually replaced weekly. Patient currently has excess sediment which has completely occluded the tubing causing all urine to leak around foley. Foley replaced with sterile technique. Immediate return of 500cc clear, yellow urine.

## 2018-05-31 NOTE — Progress Notes (Signed)
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 Other Swelling  . Lisinopril-Hydrochlorothiazide Swelling    Swelling of the tongue  . Beef-Derived Products Swelling    Facial and lip swelling (due to tick bite)  . Dairy Aid [Lactase] Swelling  . Lisinopril-Hydrochlorothiazide Swelling    Tongue swelling  . Pork-Derived Products Swelling    Patient Measurements: Height: 5\' 6"  (167.6 cm) Weight: 164 lb 14.5 oz (74.8 kg) IBW/kg (Calculated) : 59.3 Heparin Dosing Weight:    Vital Signs: Temp: 97.5 F (36.4 C) (04/28 2320) Temp Source: Oral (04/28 2320) BP: 122/89 (04/28 2320) Pulse Rate: 98 (04/28 2320)  Labs: Recent Labs    05/30/18 1758 05/31/18 0358  HGB 12.5 12.5  HCT 38.2 38.3  PLT 472* 463*  APTT  --  43*  LABPROT  --  13.8  INR  --  1.1  CREATININE 0.45 0.41*  TROPONINI 0.03*  --     Estimated Creatinine Clearance: 57 mL/min (A) (by C-G formula based on SCr of 0.41 mg/dL (L)).   Medical History: Past Medical History:  Diagnosis Date  . Acute blood loss anemia   . Arthritis   . Cancer ([place])    skin  . Central cord syndrome at C6 level of cervical spinal cord ([place]) 11/29/2017  . Hypertension   . Protein-calorie malnutrition, severe ([place]) 01/24/2018  . S/P insertion of IVC (inferior vena caval) filter 01/24/2018  . 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([place])     Medications:  Medications Prior to Admission  Medication Sig Dispense Refill Last Dose  . acetaminophen (TYLENOL) 325 MG tablet Take 650 mg by mouth every 6 (six) hours.    05/30/2018 at 1357  . alum & mag hydroxide-simeth (MAALOX PLUS) 400-400-40 MG/5ML suspension Take 30 mLs by mouth every 4 (four) hours as needed for indigestion.   05/30/2018 at 1403  . amLODipine (NORVASC) 10 MG tablet Take 10 mg by mouth daily after supper.    05/30/2018 at 1618  . calcium carbonate (TUMS EX) 750 MG chewable tablet Chew 1  tablet by mouth 3 (three) times daily.   05/30/2018 at 1357  . Capsaicin 0.1 % CREA Apply 1 application topically 2 (two) times daily. Apply to left shoulder, bilateral hands, and knees   05/30/2018 at 0901  . DULoxetine (CYMBALTA) 60 MG capsule Take 60 mg by mouth daily.    05/30/2018 at 0901  . gabapentin (NEURONTIN) 400 MG capsule Take 400 mg by mouth every 6 (six) hours.   05/30/2018 at 1357  . loratadine (CLARITIN) 10 MG tablet Take 10 mg by mouth daily.    05/30/2018 at 0901  . Multiple Vitamin (MULTIVITAMIN WITH MINERALS) TABS tablet Take 1 tablet by mouth daily.   05/30/2018 at 0901  . nortriptyline (PAMELOR) 25 MG capsule Take 25 mg by mouth at bedtime.   05/29/2018 at 2033  . Probiotic Product (RISA-BID PROBIOTIC) TABS Take 1 tablet by mouth daily.    05/30/2018 at 0901  . rivaroxaban (XARELTO) 20 MG TABS tablet Take 1 tablet (20 mg total) by mouth daily with supper. 30 tablet  05/30/2018 at 1618  . Skin Protectants, Misc. (ENDIT EX) Apply topically to bottom / peri-area after preforming peri-care   05/30/2018 at 0738  . spironolactone (ALDACTONE) 25 MG tablet Take 25 mg by mouth 2 (two) times daily.    05/30/2018 at 1700  . tizanidine (ZANAFLEX) 2 MG capsule Take 2  mg by mouth every 8 (eight) hours.    05/30/2018 at 1357  . Alpha-D-Galactosidase (BEANO) TABS Take 150 mg by mouth 3 (three) times daily with meals as needed (Bloating). Give 2 tabs = 300 mg   Unknown at prn  . Amino Acids-Protein Hydrolys (FEEDING SUPPLEMENT, PRO-STAT SUGAR FREE 64,) LIQD Take 30 mLs by mouth 4 (four) times daily.    Unknown at prn  . bisacodyl (DULCOLAX) 10 MG suppository Place 10 mg rectally daily as needed for moderate constipation.   Unknown at prn  . collagenase (SANTYL) ointment Apply nickel thick amount topically once daily.  Clean left heel wound with normal saline, skin prep periwound, apply santy nickel thick to wound bed and normal saline gauze, cover with allevyn dressing.   Unknown at prn  . Glycerin, Adult,  2.1 g SUPP Place 1 suppository rectally daily at 6 (six) AM.  0 Unknown at prn  . hydrocortisone cream 1 % Apply 1 application topically 4 (four) times daily as needed for itching.   Unknown at prn  . NON FORMULARY Diet Type:  Regular - NO BEEF, PORK OR DAIRY PRODUCTS   Unknown at Unknown time  . Ostomy Supplies (SKIN PREP WIPES) MISC Apply to both heals daily   Unknown at Unknown time  . oxyCODONE (OXY IR/ROXICODONE) 5 MG immediate release tablet Take 0.5 tablets (2.5 mg total) by mouth every 12 hours as needed for severe pain. 14 tablet 0 Unknown at prn  . sodium chloride (OCEAN) 0.65 % SOLN nasal spray Place 2 sprays into both nostrils 3 (three) times daily as needed for congestion.   Unknown at prn    Assessment: Pharmacy consulted to dose argatroban in this 84 year old female with history of DVT.  Pt was on Xarelto at home previously, last dose of xarelto was on 4/28  @ 1600.   Pt has allergy to pork products so heparin is not indicated for this pt.    In order to dose argatroban we need a Child-Pugh score .   Relevant labs have been ordered for 4/29 AM.   Will dose argatroban when INR is resulted.   4/29- Child-Pugh score = 3,    Total bilirubin 1.2, Albumin 3.1, INR 1.1, no ascites or hepatic encephalopathy noted.  APTT 43   Goal of Therapy:  APTT = 50 - 90 seconds   Plan:  Start argatroban drip at 1 mcg/kg/min (wt= 74.8 kg) on 4/29 @ 1600.   Check aPTT in 2 hours. CBC daily  Zaylei Mullane A 05/31/2018,10:28 AM

## 2018-05-31 NOTE — TOC Initial Note (Signed)
Transition of Care [facility]) - Initial/Assessment Note    Patient Details  Name: Sherry [name] MRN: [id number] Date of Birth: Mar 31, 1934  Transition of Care [facility]) CM/SW Contact:    Annamaria Boots, [place] Phone Number: 05/31/2018, 12:24 PM  Clinical Narrative: Patient admitted from [facility]. CSW contacted patient's daughter [name] [phone]. [name] states that patient is from long term care at [facility] place and should return there at discharge. Daughter reports that patient plans to move to [facility] once [facility] closes later this summer. CSW also notified [name] at [facility] of admission. Per [name], patient can return when medically ready. CSW will continue to follow for discharge planning.                    Expected Discharge Plan: [place] Barriers to Discharge: Continued Medical Work up   Patient Goals and CMS Choice Patient states their goals for this hospitalization and ongoing recovery are:: Return to [facility].gov Compare Post Acute Care list provided to:: Patient Represenative (must comment)(Daughter- [name]) Choice offered to / list presented to : Adult Children  Expected Discharge Plan and Services Expected Discharge Plan: [place] arrangements for the past 2 months: [place]                                      Prior Living Arrangements/Services Living arrangements for the past 2 months: [place] Lives with:: Facility Resident Patient language and need for interpreter reviewed:: Yes Do you feel safe going back to the place where you live?: Yes      Need for Family Participation in Patient Care: Yes (Comment) Care giver support system in place?: Yes (comment)   Criminal Activity/Legal Involvement Pertinent to Current Situation/Hospitalization: No - Comment as needed  Activities of Daily Living Home Assistive Devices/Equipment:  Wheelchair ADL Screening (condition at time of admission) Patient's cognitive ability adequate to safely complete daily activities?: Yes Is the patient deaf or have difficulty hearing?: No Does the patient have difficulty seeing, even when wearing glasses/contacts?: No Does the patient have difficulty concentrating, remembering, or making decisions?: No Patient able to express need for assistance with ADLs?: Yes Does the patient have difficulty dressing or bathing?: Yes Independently performs ADLs?: No Communication: Independent Dressing (OT): Dependent Is this a change from baseline?: Pre-admission baseline Grooming: Dependent Is this a change from baseline?: Pre-admission baseline Feeding: Dependent Is this a change from baseline?: Pre-admission baseline Bathing: Dependent Is this a change from baseline?: Pre-admission baseline Toileting: Dependent Is this a change from baseline?: Pre-admission baseline In/Out Bed: Dependent Is this a change from baseline?: Pre-admission baseline Walks in Home: Dependent Is this a change from baseline?: Pre-admission baseline Does the patient have difficulty walking or climbing stairs?: Yes Weakness of Legs: Both Weakness of Arms/Hands: Both  Permission Sought/Granted Permission sought to share information with : Case Manager, Customer service manager, Family Supports Permission granted to share information with : Yes, Verbal Permission Granted              Emotional Assessment Appearance:: Appears stated age Attitude/Demeanor/Rapport: Lethargic Affect (typically observed): Appropriate Orientation: : Oriented to Self, Oriented to Place Alcohol / Substance Use: Not Applicable Psych Involvement: No (comment)  Admission diagnosis:  Appendicitis, unspecified appendicitis type [K37] Patient Active Problem List   Diagnosis Date Noted  . Acute  appendicitis with appendiceal abscess 05/30/2018  . Hypocalcemia 02/15/2018  . Dysuria  02/15/2018  . Bilateral lower extremity edema 02/11/2018  . Vaginal yeast infection 01/30/2018  . Chest tightness 01/27/2018  . Healthcare-associated pneumonia 01/25/2018  . Pleural effusion, not elsewhere classified 01/25/2018  . Acute deep vein thrombosis (DVT) of left lower extremity ([place]) 01/24/2018  . Acute deep vein thrombosis (DVT) of right lower extremity ([place]) 01/24/2018  . Chronic allergic rhinitis 01/24/2018  . Depression with anxiety 01/24/2018  . UTI due to Klebsiella species 01/24/2018  . Protein-calorie malnutrition, severe ([place]) 01/24/2018  . S/P insertion of IVC (inferior vena caval) filter 01/24/2018  . Reactive depression   . Hypokalemia   . Leukocytosis   . Essential hypertension   . Trauma   . 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([place])   . Neuropathic pain   . Neurogenic bowel   . Neurogenic bladder   . Benign essential HTN   . Acute blood loss anemia   . Central cord syndrome at C6 level of cervical spinal cord ([place]) 11/29/2017  . Central cord syndrome ([place]) 11/29/2017   PCP:  Glendon Axe, MD Pharmacy:   [facility], [address] [place], [place] [address] [place] [address] Phone: [phone] Fax: [phone]     Social Determinants of Health (SDOH) Interventions    Readmission Risk Interventions No flowsheet data found.

## 2018-05-31 NOTE — Progress Notes (Signed)
PT Cancellation Note  Patient Details Name: Sherry [name] MRN: [id number] DOB: May 31, 1934   Cancelled Treatment:    Reason Eval/Treat Not Completed: Other (comment)(PT spoke with RN at [facility] long term care to gather information of baseline functioning. Per RN, pt dependent for all mobility, hoyer lift to power chair at baseline. Per chart review and information, pt is at baseline levels of function.) If any acute PT needs arise, please re-consult PT as needed, PT to sign off.    Lieutenant Diego PT, DPT 4:43 PM,05/31/18 [phone]

## 2018-05-31 NOTE — Progress Notes (Signed)
[facility] Hospital Day(s): 1.   Post op day(s):  Marland Kitchen   Interval History: Patient seen and examined, no acute events or new complaints overnight. Patient reports feeling better today.  She reports she feels she has passed more stool and she was changed recently.  Due to the decrease in sedation on the area she cannot specify the amount of stool that she is passing or gas.  Denies abdominal pain at the moment of my evaluation.  Denies fever chills.  Vital signs in last 24 hours: [min-max] current  Temp:  [97.5 F (36.4 C)-98.1 F (36.7 C)] 97.5 F (36.4 C) (04/28 2320) Pulse Rate:  [94-101] 98 (04/28 2320) Resp:  [13-24] 24 (04/28 2320) BP: (122-129)/(77-89) 122/89 (04/28 2320) SpO2:  [94 %-97 %] 97 % (04/28 2320) Weight:  [74.8 kg-75.3 kg] 74.8 kg (04/28 2320)     Height: 5\' 6"  (167.6 cm) Weight: 74.8 kg BMI (Calculated): 26.63   Physical Exam:  Constitutional: alert, cooperative and no distress  Respiratory: breathing non-labored at rest  Cardiovascular: regular rate and sinus rhythm  Gastrointestinal: soft, non-tender, and non-distended  Labs:  CBC Latest Ref Rng & Units 05/31/2018 05/30/2018 04/14/2018  WBC 4.0 - 10.5 K/uL 11.1(H) 12.7(H) 6.4  Hemoglobin 12.0 - 15.0 g/dL 12.5 12.5 11.1(L)  Hematocrit 36.0 - 46.0 % 38.3 38.2 34.2(L)  Platelets 150 - 400 K/uL 463(H) 472(H) 410(H)   CMP Latest Ref Rng & Units 05/31/2018 05/30/2018 05/30/2018  Glucose 70 - 99 mg/dL 99 116(H) -  BUN 8 - 23 mg/dL 12 15 -  Creatinine 0.44 - 1.00 mg/dL 0.41(L) 0.45 -  Sodium 135 - 145 mmol/L 135 134(L) -  Potassium 3.5 - 5.1 mmol/L 4.3 4.2 -  Chloride 98 - 111 mmol/L 98 96(L) -  CO2 22 - 32 mmol/L 28 26 -  Calcium 8.9 - 10.3 mg/dL 8.7(L) 8.8(L) -  Total Protein 6.5 - 8.1 g/dL 6.5 6.4(L) 6.5  Total Bilirubin 0.3 - 1.2 mg/dL 1.2 1.0 -  Alkaline Phos 38 - 126 U/L 118 118 -  AST 15 - 41 U/L 20 24 -  ALT 0 - 44 U/L 17 17 -    Imaging studies: No new pertinent imaging  studies   Assessment/Plan:  84 y.o.femalewith acute appendicitis with phlegmon and fecal impaction, complicated by pertinent comorbidities includingcentral cord syndrome with significant weakness, chronically bedridden, Essential hypertension, neurogenic bladder and bowel syndromes.  Acute appendicitis with phlegmon process -Continue IV antibiotic therapy.  Patient responding well with slightly improvement of white blood cell count and significant improvement of abdominal pain. -We will start clear liquid diet and assess for toleration. -We will continue IV fluids until she started tolerating more diet.  Fecal impaction -Patient reports feeling better after this implantation last night.  Abdomen is softer and no abdominal pain. -We will continue with suppositories and medical management of severe constipation.  -History of DVT Pharmacy consult for management of Argatroban drip which is short acting anticoagulation in case she needs surgical or invasive procedure to be able to be reverted.  Elevated troponins -No chest pain will contact hospitalist for further recommendations.  Arnold Long, MD

## 2018-06-01 LAB — CBC
HCT: 33.8 % — ABNORMAL LOW (ref 36.0–46.0)
Hemoglobin: 11 g/dL — ABNORMAL LOW (ref 12.0–15.0)
MCH: 28.2 pg (ref 26.0–34.0)
MCHC: 32.5 g/dL (ref 30.0–36.0)
MCV: 86.7 fL (ref 80.0–100.0)
Platelets: 435 10*3/uL — ABNORMAL HIGH (ref 150–400)
RBC: 3.9 MIL/uL (ref 3.87–5.11)
RDW: 14.7 % (ref 11.5–15.5)
WBC: 10.3 10*3/uL (ref 4.0–10.5)
nRBC: 0 % (ref 0.0–0.2)

## 2018-06-01 LAB — APTT
aPTT: 89 seconds — ABNORMAL HIGH (ref 24–36)
aPTT: 75 seconds — ABNORMAL HIGH (ref 24–36)
aPTT: 75 seconds — ABNORMAL HIGH (ref 24–36)

## 2018-06-01 NOTE — Progress Notes (Signed)
The IV  pump will only allow me to input 4 mL/hr instead 4.04 mL/hr. . And also barcode does not scan through.Pharmacist is aware about the issue.

## 2018-06-01 NOTE — Progress Notes (Signed)
[place] for Argatroban  Indication: history of DVT- on Xarelto PTA w/ Pork allergy  Allergies  Allergen Reactions  . Other Swelling  . Lisinopril-Hydrochlorothiazide Swelling    Swelling of the tongue  . Beef-Derived Products Swelling    Facial and lip swelling (due to tick bite)  . Dairy Aid [Lactase] Swelling  . Lisinopril-Hydrochlorothiazide Swelling    Tongue swelling  . Pork-Derived Products Swelling    Patient Measurements: Height: 5\' 6"  (167.6 cm) Weight: 164 lb 14.5 oz (74.8 kg) IBW/kg (Calculated) : 59.3 Heparin Dosing Weight:    Vital Signs: Temp: 98.2 F (36.8 C) (04/30 0414) Temp Source: Oral (04/30 0414) BP: 110/69 (04/30 0414) Pulse Rate: 108 (04/30 0414)  Labs: Recent Labs    05/30/18 1758  05/31/18 0358 05/31/18 2000 06/01/18 0630 06/01/18 1019  HGB 12.5  --  12.5  --  11.0*  --   HCT 38.2  --  38.3  --  33.8*  --   PLT 472*  --  463*  --  435*  --   APTT  --    < > 43* 82* 89* 75*  LABPROT  --   --  13.8  --   --   --   INR  --   --  1.1  --   --   --   CREATININE 0.45  --  0.41*  --   --   --   TROPONINI 0.03*  --   --   --   --   --    < > = values in this interval not displayed.    Estimated Creatinine Clearance: 57 mL/min (A) (by C-G formula based on SCr of 0.41 mg/dL (L)).   Medical History: Past Medical History:  Diagnosis Date  . Acute blood loss anemia   . Arthritis   . Cancer ([place])    skin  . Central cord syndrome at C6 level of cervical spinal cord ([place]) 11/29/2017  . Hypertension   . Protein-calorie malnutrition, severe ([place]) 01/24/2018  . S/P insertion of IVC (inferior vena caval) filter 01/24/2018  . 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([place])     Medications:  Medications Prior to Admission  Medication Sig Dispense Refill Last Dose  . acetaminophen (TYLENOL) 325 MG tablet Take 650 mg by mouth every 6 (six) hours.    05/30/2018 at 1357  . alum & mag hydroxide-simeth (MAALOX PLUS) 400-400-40 MG/5ML  suspension Take 30 mLs by mouth every 4 (four) hours as needed for indigestion.   05/30/2018 at 1403  . amLODipine (NORVASC) 10 MG tablet Take 10 mg by mouth daily after supper.    05/30/2018 at 1618  . calcium carbonate (TUMS EX) 750 MG chewable tablet Chew 1 tablet by mouth 3 (three) times daily.   05/30/2018 at 1357  . Capsaicin 0.1 % CREA Apply 1 application topically 2 (two) times daily. Apply to left shoulder, bilateral hands, and knees   05/30/2018 at 0901  . DULoxetine (CYMBALTA) 60 MG capsule Take 60 mg by mouth daily.    05/30/2018 at 0901  . gabapentin (NEURONTIN) 400 MG capsule Take 400 mg by mouth every 6 (six) hours.   05/30/2018 at 1357  . loratadine (CLARITIN) 10 MG tablet Take 10 mg by mouth daily.    05/30/2018 at 0901  . Multiple Vitamin (MULTIVITAMIN WITH MINERALS) TABS tablet Take 1 tablet by mouth daily.   05/30/2018 at 0901  . nortriptyline (PAMELOR) 25 MG capsule Take 25 mg by mouth  at bedtime.   05/29/2018 at 2033  . Probiotic Product (RISA-BID PROBIOTIC) TABS Take 1 tablet by mouth daily.    05/30/2018 at 0901  . rivaroxaban (XARELTO) 20 MG TABS tablet Take 1 tablet (20 mg total) by mouth daily with supper. 30 tablet  05/30/2018 at 1618  . Skin Protectants, Misc. (ENDIT EX) Apply topically to bottom / peri-area after preforming peri-care   05/30/2018 at 0738  . spironolactone (ALDACTONE) 25 MG tablet Take 25 mg by mouth 2 (two) times daily.    05/30/2018 at 1700  . tizanidine (ZANAFLEX) 2 MG capsule Take 2 mg by mouth every 8 (eight) hours.    05/30/2018 at 1357  . Alpha-D-Galactosidase (BEANO) TABS Take 150 mg by mouth 3 (three) times daily with meals as needed (Bloating). Give 2 tabs = 300 mg   Unknown at prn  . Amino Acids-Protein Hydrolys (FEEDING SUPPLEMENT, PRO-STAT SUGAR FREE 64,) LIQD Take 30 mLs by mouth 4 (four) times daily.    Unknown at prn  . bisacodyl (DULCOLAX) 10 MG suppository Place 10 mg rectally daily as needed for moderate constipation.   Unknown at prn  . collagenase  (SANTYL) ointment Apply nickel thick amount topically once daily.  Clean left heel wound with normal saline, skin prep periwound, apply santy nickel thick to wound bed and normal saline gauze, cover with allevyn dressing.   Unknown at prn  . Glycerin, Adult, 2.1 g SUPP Place 1 suppository rectally daily at 6 (six) AM.  0 Unknown at prn  . hydrocortisone cream 1 % Apply 1 application topically 4 (four) times daily as needed for itching.   Unknown at prn  . NON FORMULARY Diet Type:  Regular - NO BEEF, PORK OR DAIRY PRODUCTS   Unknown at Unknown time  . Ostomy Supplies (SKIN PREP WIPES) MISC Apply to both heals daily   Unknown at Unknown time  . oxyCODONE (OXY IR/ROXICODONE) 5 MG immediate release tablet Take 0.5 tablets (2.5 mg total) by mouth every 12 hours as needed for severe pain. 14 tablet 0 Unknown at prn  . sodium chloride (OCEAN) 0.65 % SOLN nasal spray Place 2 sprays into both nostrils 3 (three) times daily as needed for congestion.   Unknown at prn    Assessment: Pharmacy consulted to dose argatroban in this 84 year old female with history of DVT.  Pt was on Xarelto at home previously, last dose of xarelto was on 4/28  @ 1600.   Pt has allergy to pork products so heparin is not indicated for this pt.    In order to dose argatroban we need a Child-Pugh score .   Relevant labs have been ordered for 4/29 AM.   Will dose argatroban when INR is resulted.   4/29- Child-Pugh score = 3,    Total bilirubin 1.2, Albumin 3.1, INR 1.1, no ascites or hepatic encephalopathy noted.  APTT 43  4/29 drip started @ 17mcg/kg/min 4/29 @ 2000 APTT 82 4/30 @ 0630 APTT 89 4/30 @ 1019 APTT 75  Goal of Therapy:  APTT = 50 - 90 seconds   Plan:  4/30 @ 1019 APTT 75- level is therapeutic. Will continue with infusion rate of 0.9 mcg/kg/min. Will recheck aPTT in 2 hours for confirmation. Continue to check CBCs daily    Paulina Fusi, PharmD, BCPS 06/01/2018 10:53 AM

## 2018-06-01 NOTE — Progress Notes (Signed)
[facility] Hospital Day(s): 2.   Post op day(s):  Marland Kitchen   Interval History: Patient seen and examined, no acute events or new complaints overnight. Patient denies abdominal pain.  Denies nausea or vomiting.  Reports she was change due to bowel movement but does not know the amount of stool.  Denies fever chills.  Reports tolerating the clear liquid without pain.  Vital signs in last 24 hours: [min-max] current  Temp:  [98.1 F (36.7 C)-98.2 F (36.8 C)] 98.2 F (36.8 C) (04/30 0414) Pulse Rate:  [93-108] 108 (04/30 0414) Resp:  [20] 20 (04/30 0414) BP: (110-125)/(61-69) 110/69 (04/30 0414) SpO2:  [97 %-100 %] 98 % (04/30 0414)     Height: 5\' 6"  (167.6 cm) Weight: 74.8 kg BMI (Calculated): 26.63   Physical Exam:  Constitutional: alert, cooperative and no distress  Respiratory: breathing non-labored at rest  Cardiovascular: regular rate and sinus rhythm  Gastrointestinal: soft, non-tender, and non-distended  Labs:  CBC Latest Ref Rng & Units 06/01/2018 05/31/2018 05/30/2018  WBC 4.0 - 10.5 K/uL 10.3 11.1(H) 12.7(H)  Hemoglobin 12.0 - 15.0 g/dL 11.0(L) 12.5 12.5  Hematocrit 36.0 - 46.0 % 33.8(L) 38.3 38.2  Platelets 150 - 400 K/uL 435(H) 463(H) 472(H)   CMP Latest Ref Rng & Units 05/31/2018 05/30/2018 05/30/2018  Glucose 70 - 99 mg/dL 99 116(H) -  BUN 8 - 23 mg/dL 12 15 -  Creatinine 0.44 - 1.00 mg/dL 0.41(L) 0.45 -  Sodium 135 - 145 mmol/L 135 134(L) -  Potassium 3.5 - 5.1 mmol/L 4.3 4.2 -  Chloride 98 - 111 mmol/L 98 96(L) -  CO2 22 - 32 mmol/L 28 26 -  Calcium 8.9 - 10.3 mg/dL 8.7(L) 8.8(L) -  Total Protein 6.5 - 8.1 g/dL 6.5 6.4(L) 6.5  Total Bilirubin 0.3 - 1.2 mg/dL 1.2 1.0 -  Alkaline Phos 38 - 126 U/L 118 118 -  AST 15 - 41 U/L 20 24 -  ALT 0 - 44 U/L 17 17 -    Imaging studies: No new pertinent imaging studies   Assessment/Plan:  84 y.o.femalewith acute appendicitis with phlegmon and fecal impaction, complicated by pertinent comorbidities  includingcentral cord syndrome with significant weakness, chronically bedridden, Essential hypertension, neurogenic bladder and bowel syndromes.  Acute appendicitis with phlegmon process -Continue IV antibiotic therapy.    Patient's heart rate between 93-108.  No abdominal pain.  The abdomen is nontender on palpation.  The abdomen is soft and nondistended. -Will advance diet to full liquid diet and assess for toleration. -Will adjust IV fluids.  Fecal impaction -Having a bowel movement looks like it is not enough for the amount of stool diet was on the CT scan.  I will order a water enema for cleansing of the rectal vault.  We will continue with MiraLAX and mineral oil.  -History of DVT Pharmacy consult for management of Argatroban drip which is short acting anticoagulation in case she needs surgical or invasive procedure to be able to be reverted.  Appreciate hospitalist evaluation and recommendations for medical management of chronic medical conditions.  Arnold Long, MD

## 2018-06-01 NOTE — Progress Notes (Signed)
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 Other Swelling  . Lisinopril-Hydrochlorothiazide Swelling    Swelling of the tongue  . Beef-Derived Products Swelling    Facial and lip swelling (due to tick bite)  . Dairy Aid [Lactase] Swelling  . Lisinopril-Hydrochlorothiazide Swelling    Tongue swelling  . Pork-Derived Products Swelling    Patient Measurements: Height: 5\' 6"  (167.6 cm) Weight: 164 lb 14.5 oz (74.8 kg) IBW/kg (Calculated) : 59.3 Heparin Dosing Weight:    Vital Signs: Temp: 98.2 F (36.8 C) (04/30 0414) Temp Source: Oral (04/30 0414) BP: 110/69 (04/30 0414) Pulse Rate: 108 (04/30 0414)  Labs: Recent Labs    05/30/18 1758 05/31/18 0358 05/31/18 2000 06/01/18 0630  HGB 12.5 12.5  --  11.0*  HCT 38.2 38.3  --  33.8*  PLT 472* 463*  --  435*  APTT  --  43* 82* 89*  LABPROT  --  13.8  --   --   INR  --  1.1  --   --   CREATININE 0.45 0.41*  --   --   TROPONINI 0.03*  --   --   --     Estimated Creatinine Clearance: 57 mL/min (A) (by C-G formula based on SCr of 0.41 mg/dL (L)).   Medical History: Past Medical History:  Diagnosis Date  . Acute blood loss anemia   . Arthritis   . Cancer ([place])    skin  . Central cord syndrome at C6 level of cervical spinal cord ([place]) 11/29/2017  . Hypertension   . Protein-calorie malnutrition, severe ([place]) 01/24/2018  . S/P insertion of IVC (inferior vena caval) filter 01/24/2018  . 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([place])     Medications:  Medications Prior to Admission  Medication Sig Dispense Refill Last Dose  . acetaminophen (TYLENOL) 325 MG tablet Take 650 mg by mouth every 6 (six) hours.    05/30/2018 at 1357  . alum & mag hydroxide-simeth (MAALOX PLUS) 400-400-40 MG/5ML suspension Take 30 mLs by mouth every 4 (four) hours as needed for indigestion.   05/30/2018 at 1403  . amLODipine (NORVASC) 10 MG tablet Take 10 mg  by mouth daily after supper.    05/30/2018 at 1618  . calcium carbonate (TUMS EX) 750 MG chewable tablet Chew 1 tablet by mouth 3 (three) times daily.   05/30/2018 at 1357  . Capsaicin 0.1 % CREA Apply 1 application topically 2 (two) times daily. Apply to left shoulder, bilateral hands, and knees   05/30/2018 at 0901  . DULoxetine (CYMBALTA) 60 MG capsule Take 60 mg by mouth daily.    05/30/2018 at 0901  . gabapentin (NEURONTIN) 400 MG capsule Take 400 mg by mouth every 6 (six) hours.   05/30/2018 at 1357  . loratadine (CLARITIN) 10 MG tablet Take 10 mg by mouth daily.    05/30/2018 at 0901  . Multiple Vitamin (MULTIVITAMIN WITH MINERALS) TABS tablet Take 1 tablet by mouth daily.   05/30/2018 at 0901  . nortriptyline (PAMELOR) 25 MG capsule Take 25 mg by mouth at bedtime.   05/29/2018 at 2033  . Probiotic Product (RISA-BID PROBIOTIC) TABS Take 1 tablet by mouth daily.    05/30/2018 at 0901  . rivaroxaban (XARELTO) 20 MG TABS tablet Take 1 tablet (20 mg total) by mouth daily with supper. 30 tablet  05/30/2018 at 1618  . Skin Protectants, Misc. (ENDIT EX) Apply topically to  bottom / peri-area after preforming peri-care   05/30/2018 at 0738  . spironolactone (ALDACTONE) 25 MG tablet Take 25 mg by mouth 2 (two) times daily.    05/30/2018 at 1700  . tizanidine (ZANAFLEX) 2 MG capsule Take 2 mg by mouth every 8 (eight) hours.    05/30/2018 at 1357  . Alpha-D-Galactosidase (BEANO) TABS Take 150 mg by mouth 3 (three) times daily with meals as needed (Bloating). Give 2 tabs = 300 mg   Unknown at prn  . Amino Acids-Protein Hydrolys (FEEDING SUPPLEMENT, PRO-STAT SUGAR FREE 64,) LIQD Take 30 mLs by mouth 4 (four) times daily.    Unknown at prn  . bisacodyl (DULCOLAX) 10 MG suppository Place 10 mg rectally daily as needed for moderate constipation.   Unknown at prn  . collagenase (SANTYL) ointment Apply nickel thick amount topically once daily.  Clean left heel wound with normal saline, skin prep periwound, apply santy nickel  thick to wound bed and normal saline gauze, cover with allevyn dressing.   Unknown at prn  . Glycerin, Adult, 2.1 g SUPP Place 1 suppository rectally daily at 6 (six) AM.  0 Unknown at prn  . hydrocortisone cream 1 % Apply 1 application topically 4 (four) times daily as needed for itching.   Unknown at prn  . NON FORMULARY Diet Type:  Regular - NO BEEF, PORK OR DAIRY PRODUCTS   Unknown at Unknown time  . Ostomy Supplies (SKIN PREP WIPES) MISC Apply to both heals daily   Unknown at Unknown time  . oxyCODONE (OXY IR/ROXICODONE) 5 MG immediate release tablet Take 0.5 tablets (2.5 mg total) by mouth every 12 hours as needed for severe pain. 14 tablet 0 Unknown at prn  . sodium chloride (OCEAN) 0.65 % SOLN nasal spray Place 2 sprays into both nostrils 3 (three) times daily as needed for congestion.   Unknown at prn    Assessment: Pharmacy consulted to dose argatroban in this 84 year old female with history of DVT.  Pt was on Xarelto at home previously, last dose of xarelto was on 4/28  @ 1600.   Pt has allergy to pork products so heparin is not indicated for this pt.    In order to dose argatroban we need a Child-Pugh score .   Relevant labs have been ordered for 4/29 AM.   Will dose argatroban when INR is resulted.   4/29- Child-Pugh score = 3,    Total bilirubin 1.2, Albumin 3.1, INR 1.1, no ascites or hepatic encephalopathy noted.  APTT 43  4/29 drip started @ 45mcg/kg/min 4/29 @ 2000 APTT 82 4/30 @ 0630 APTT 89  Goal of Therapy:  APTT = 50 - 90 seconds   Plan:  4/30 @ 0630 APTT 89- level is therapeutic but trending up. To avoid supratherapeutic level will decrease infusion rate to 0.9 mcg/kg/min. Will recheck aPTT in 2 hours. Continue to check CBCs daily    Pernell Dupre, PharmD, BCPS Clinical Pharmacist 06/01/2018 7:08 AM

## 2018-06-01 NOTE — Progress Notes (Signed)
Surgery Note  I contacted the daughter to update about her mother status.  I discussed with the daughter that the patient is doing well.  There is no abdominal pain.  She tolerated full liquid diet for lunch.  I discussed with daughter that my plan is to do labs in the morning and repeat CT scan for evaluation of the phlegmon around appendix and the status of the obstipation.  With the results of the labs, CT scan, vital signs and physical exam will discuss further recommendation and management.

## 2018-06-01 NOTE — Progress Notes (Signed)
Sherry [name] at [place] NAME: Sherry [name]    MR#:  [id number]  DATE OF BIRTH:  08/13/1934  SUBJECTIVE:  CHIEF COMPLAINT:   Chief Complaint  Patient presents with  . Bloated   Complains of mild pain in lower abdomen.  Has not had breakfast yet  REVIEW OF SYSTEMS:    Review of Systems  Constitutional: Positive for malaise/fatigue. Negative for chills and fever.  HENT: Negative for sore throat.   Eyes: Negative for blurred vision, double vision and pain.  Respiratory: Negative for cough, hemoptysis, shortness of breath and wheezing.   Cardiovascular: Negative for chest pain, palpitations, orthopnea and leg swelling.  Gastrointestinal: Positive for abdominal pain and constipation. Negative for diarrhea, heartburn, nausea and vomiting.  Genitourinary: Negative for dysuria and hematuria.  Musculoskeletal: Negative for back pain and joint pain.  Skin: Negative for rash.  Neurological: Negative for sensory change, speech change, focal weakness and headaches.  Endo/Heme/Allergies: Does not bruise/bleed easily.  Psychiatric/Behavioral: Negative for depression. The patient is not nervous/anxious.     DRUG ALLERGIES:   Allergies  Allergen Reactions  . Other Swelling  . Lisinopril-Hydrochlorothiazide Swelling    Swelling of the tongue  . Beef-Derived Products Swelling    Facial and lip swelling (due to tick bite)  . Dairy Aid [Lactase] Swelling  . Lisinopril-Hydrochlorothiazide Swelling    Tongue swelling  . Pork-Derived Products Swelling    VITALS:  Blood pressure 110/69, pulse (!) 108, temperature 98.2 F (36.8 C), temperature source Oral, resp. rate 20, height 5\' 6"  (1.676 m), weight 74.8 kg, SpO2 98 %.  PHYSICAL EXAMINATION:   Physical Exam  GENERAL:  84 y.o.-year-old patient lying in the bed with no acute distress.  EYES: Pupils equal, round, reactive to light and accommodation. No scleral icterus. Extraocular muscles intact.   HEENT: Head atraumatic, normocephalic. Oropharynx and nasopharynx clear.  NECK:  Supple, no jugular venous distention. No thyroid enlargement, no tenderness.  LUNGS: Normal breath sounds bilaterally, no wheezing, rales, rhonchi. No use of accessory muscles of respiration.  CARDIOVASCULAR: S1, S2 normal. No murmurs, rubs, or gallops.  ABDOMEN: Soft, lower abdominal tenderness, nondistended. Bowel sounds present. No organomegaly or mass.  EXTREMITIES: No cyanosis, clubbing or edema b/l.    NEUROLOGIC: Cranial nerves II through XII are intact. No focal Motor or sensory deficits b/l.   PSYCHIATRIC: The patient is alert and oriented x 3.  SKIN: No obvious rash, lesion, or ulcer.   LABORATORY PANEL:   CBC Recent Labs  Lab 06/01/18 0630  WBC 10.3  HGB 11.0*  HCT 33.8*  PLT 435*   ------------------------------------------------------------------------------------------------------------------ Chemistries  Recent Labs  Lab 05/31/18 0358  NA 135  K 4.3  CL 98  CO2 28  GLUCOSE 99  BUN 12  CREATININE 0.41*  CALCIUM 8.7*  MG 2.2  AST 20  ALT 17  ALKPHOS 118  BILITOT 1.2   ------------------------------------------------------------------------------------------------------------------  Cardiac Enzymes Recent Labs  Lab 05/30/18 1758  TROPONINI 0.03*   ------------------------------------------------------------------------------------------------------------------  RADIOLOGY:  Ct Abdomen Pelvis W Contrast  Result Date: 05/30/2018 CLINICAL DATA:  84 year old female with abdominal swelling for 4 days and pain. History of spinal cord injury in October due to fall, T11 vertebral fracture. EXAM: CT ABDOMEN AND PELVIS WITH CONTRAST TECHNIQUE: Multidetector CT imaging of the abdomen and pelvis was performed using the standard protocol following bolus administration of intravenous contrast. CONTRAST:  141mL OMNIPAQUE IOHEXOL 300 MG/ML  SOLN COMPARISON:  CT Chest, Abdomen, and Pelvis  11/28/2017  FINDINGS: Lower chest: Left lower lobe medial basal segment atelectasis. Mild right costophrenic angle atelectasis. Borderline to mild cardiomegaly. No pericardial or pleural effusion. Hepatobiliary: Negative liver and gallbladder. Pancreas: Negative. Spleen: Negative. Adrenals/Urinary Tract: Normal adrenal glands. Bilateral renal enhancement and contrast excretion is symmetric and within normal limits. Simple appearing bilateral renal cysts. The urinary bladder is decompressed by a Foley catheter. Stomach/Bowel: There is a 9 centimeter stool ball in the rectum and some incontinence of stool distally. There is perirectal stranding. The sigmoid colon is redundant with a retained gas and low-density stool, but is nondilated. Similar gas and low-density stool retained throughout the more proximal colon. The hepatic flexure is redundant. The cecum is dilated up to about 7 centimeters diameter. The terminal ileum is gas-filled but nondilated on series 2, image 58. Best seen on coronal images series 5, images 29-51 the appendix arises along the inferior surface of the dilated cecum near the midline and initially appears normal (images 32-35), but as it tracks laterally the calms abnormally dilated and appears to contain fluid (14 millimeters diameter image 42). At the tip of the appendix there is of 6 centimeter phlegmon like area which is inseparable from the proximal right lateral wall of the rectum (coronal image 51 and series 2, image 59. This is also inseparable from the right gonadal vein, although previously both ovaries were diminutive. No free air or extraluminal gas identified. No free fluid identified. Oral contrast was administered and has reached the distal small bowel but not yet the terminal ileum. No dilated small bowel. Small gastric hiatal hernia. Otherwise negative stomach. Vascular/Lymphatic: IVC filter in place. Diminutive IVC. Aortoiliac calcified atherosclerosis. Major arterial structures  are patent with mild generalized arterial tortuosity. Portal venous system is patent. No lymphadenopathy. Reproductive: Stable with mild uterine fibroid related calcification. Other: No pelvic free fluid, but presacral stranding. Musculoskeletal: Partially healed T11 vertebral body fracture (series 6, image 67). Mild chronic lower lumbar spondylolisthesis. Hip joint degeneration. No new osseous abnormality identified. IMPRESSION: 1. Highly suspicious for Acute Appendicitis superimposed on fecal impaction with dilated cecum and colon with retained stool throughout. The proximal appendix is normal as it arises from the inferior dilated cecum on Coronal series 5, image 29. But the distal appendix becomes abnormally dilated and terminates at a 6 cm phlegmon in the right lower quadrant which appears inseparable from the dilated rectum. A contained perforation at the tip might be possible although there is no extraluminal gas or free fluid. No dilated small bowel. 2. Presacral stranding related to fecal impaction. 3. Partially healed T11 vertebral body fracture. Study discussed by telephone with Dr. Charlotte Crumb on 05/30/2018 at 20:04 . Electronically Signed   By: Genevie Ann M.D.   On: 05/30/2018 20:13     ASSESSMENT AND PLAN:   Sherry [name]  is a 84 y.o. female with a known history of central cord syndrome, history of DVT status post IVC filter on Xarelto, hypertension, arthritis [place] after she started having abdominal discomfort and was found to have fecal impaction/severe constipation and inflammation around the appendix as noted on the CT abdomen.  1.   Acute appendicitis with phlegmon On IV antibiotics and clear liquids at this time. Plan is for conservative management per surgical team. Pain medications as needed  2.  Fecal impaction.  Status post digital disimpaction in the emergency room.  Enema being given today.  3. History of DVT status post IVC  filter. Patient had  DVT diagnosed in 2019 -she is on Xarelto. However she has allergies to pork  on IV argatroban  4. Hypertension continue amlodipine and Aldactone   All the records are reviewed and case discussed with Care Management/Social Worker Management plans discussed with the patient, family and they are in agreement.  CODE STATUS: FULL CODE  TOTAL TIME TAKING CARE OF THIS PATIENT: 30 minutes.   POSSIBLE D/C IN 1-2 DAYS, DEPENDING ON CLINICAL CONDITION.  Leia Alf Almyra Birman M.D on 06/01/2018 at 12:37 PM  Between 7am to 6pm - Pager - [phone]  After 6pm go to [email or web address] - password [email or web address] [facility]  Office  [phone]  CC: Primary care physician; Glendon Axe, MD  Note: This dictation was prepared with Dragon dictation along with smaller phrase technology. Any transcriptional errors that result from this process are unintentional.

## 2018-06-01 NOTE — Progress Notes (Signed)
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 Other Swelling  . Lisinopril-Hydrochlorothiazide Swelling    Swelling of the tongue  . Beef-Derived Products Swelling    Facial and lip swelling (due to tick bite)  . Dairy Aid [Lactase] Swelling  . Lisinopril-Hydrochlorothiazide Swelling    Tongue swelling  . Pork-Derived Products Swelling    Patient Measurements: Height: 5\' 6"  (167.6 cm) Weight: 164 lb 14.5 oz (74.8 kg) IBW/kg (Calculated) : 59.3 Heparin Dosing Weight:    Vital Signs: Temp: 98.2 F (36.8 C) (04/30 0414) Temp Source: Oral (04/30 0414) BP: 110/69 (04/30 0414) Pulse Rate: 108 (04/30 0414)  Labs: Recent Labs    05/30/18 1758 05/31/18 0358  06/01/18 0630 06/01/18 1019 06/01/18 1159  HGB 12.5 12.5  --  11.0*  --   --   HCT 38.2 38.3  --  33.8*  --   --   PLT 472* 463*  --  435*  --   --   APTT  --  43*   < > 89* 75* 75*  LABPROT  --  13.8  --   --   --   --   INR  --  1.1  --   --   --   --   CREATININE 0.45 0.41*  --   --   --   --   TROPONINI 0.03*  --   --   --   --   --    < > = values in this interval not displayed.    Estimated Creatinine Clearance: 57 mL/min (A) (by C-G formula based on SCr of 0.41 mg/dL (L)).   Medical History: Past Medical History:  Diagnosis Date  . Acute blood loss anemia   . Arthritis   . Cancer ([place])    skin  . Central cord syndrome at C6 level of cervical spinal cord ([place]) 11/29/2017  . Hypertension   . Protein-calorie malnutrition, severe ([place]) 01/24/2018  . S/P insertion of IVC (inferior vena caval) filter 01/24/2018  . 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([place])     Medications:  Medications Prior to Admission  Medication Sig Dispense Refill Last Dose  . acetaminophen (TYLENOL) 325 MG tablet Take 650 mg by mouth every 6 (six) hours.    05/30/2018 at 1357  . alum & mag hydroxide-simeth (MAALOX PLUS) 400-400-40 MG/5ML  suspension Take 30 mLs by mouth every 4 (four) hours as needed for indigestion.   05/30/2018 at 1403  . amLODipine (NORVASC) 10 MG tablet Take 10 mg by mouth daily after supper.    05/30/2018 at 1618  . calcium carbonate (TUMS EX) 750 MG chewable tablet Chew 1 tablet by mouth 3 (three) times daily.   05/30/2018 at 1357  . Capsaicin 0.1 % CREA Apply 1 application topically 2 (two) times daily. Apply to left shoulder, bilateral hands, and knees   05/30/2018 at 0901  . DULoxetine (CYMBALTA) 60 MG capsule Take 60 mg by mouth daily.    05/30/2018 at 0901  . gabapentin (NEURONTIN) 400 MG capsule Take 400 mg by mouth every 6 (six) hours.   05/30/2018 at 1357  . loratadine (CLARITIN) 10 MG tablet Take 10 mg by mouth daily.    05/30/2018 at 0901  . Multiple Vitamin (MULTIVITAMIN WITH MINERALS) TABS tablet Take 1 tablet by mouth daily.   05/30/2018 at 0901  . nortriptyline (PAMELOR) 25 MG capsule Take 25 mg by mouth  at bedtime.   05/29/2018 at 2033  . Probiotic Product (RISA-BID PROBIOTIC) TABS Take 1 tablet by mouth daily.    05/30/2018 at 0901  . rivaroxaban (XARELTO) 20 MG TABS tablet Take 1 tablet (20 mg total) by mouth daily with supper. 30 tablet  05/30/2018 at 1618  . Skin Protectants, Misc. (ENDIT EX) Apply topically to bottom / peri-area after preforming peri-care   05/30/2018 at 0738  . spironolactone (ALDACTONE) 25 MG tablet Take 25 mg by mouth 2 (two) times daily.    05/30/2018 at 1700  . tizanidine (ZANAFLEX) 2 MG capsule Take 2 mg by mouth every 8 (eight) hours.    05/30/2018 at 1357  . Alpha-D-Galactosidase (BEANO) TABS Take 150 mg by mouth 3 (three) times daily with meals as needed (Bloating). Give 2 tabs = 300 mg   Unknown at prn  . Amino Acids-Protein Hydrolys (FEEDING SUPPLEMENT, PRO-STAT SUGAR FREE 64,) LIQD Take 30 mLs by mouth 4 (four) times daily.    Unknown at prn  . bisacodyl (DULCOLAX) 10 MG suppository Place 10 mg rectally daily as needed for moderate constipation.   Unknown at prn  . collagenase  (SANTYL) ointment Apply nickel thick amount topically once daily.  Clean left heel wound with normal saline, skin prep periwound, apply santy nickel thick to wound bed and normal saline gauze, cover with allevyn dressing.   Unknown at prn  . Glycerin, Adult, 2.1 g SUPP Place 1 suppository rectally daily at 6 (six) AM.  0 Unknown at prn  . hydrocortisone cream 1 % Apply 1 application topically 4 (four) times daily as needed for itching.   Unknown at prn  . NON FORMULARY Diet Type:  Regular - NO BEEF, PORK OR DAIRY PRODUCTS   Unknown at Unknown time  . Ostomy Supplies (SKIN PREP WIPES) MISC Apply to both heals daily   Unknown at Unknown time  . oxyCODONE (OXY IR/ROXICODONE) 5 MG immediate release tablet Take 0.5 tablets (2.5 mg total) by mouth every 12 hours as needed for severe pain. 14 tablet 0 Unknown at prn  . sodium chloride (OCEAN) 0.65 % SOLN nasal spray Place 2 sprays into both nostrils 3 (three) times daily as needed for congestion.   Unknown at prn    Assessment: Pharmacy consulted to dose argatroban in this 84 year old female with history of DVT.  Pt was on Xarelto at home previously, last dose of xarelto was on 4/28  @ 1600.   Pt has allergy to pork products so heparin is not indicated for this pt.    In order to dose argatroban we need a Child-Pugh score .   Relevant labs have been ordered for 4/29 AM.   Will dose argatroban when INR is resulted.   4/29- Child-Pugh score = 3,    Total bilirubin 1.2, Albumin 3.1, INR 1.1, no ascites or hepatic encephalopathy noted.  APTT 43  4/29 drip started @ 66mcg/kg/min 4/29 @ 2000 APTT 82 4/30 @ 0630 APTT 89 4/30 @ 1019 APTT 75 4/30 @1159  APTT 75  Goal of Therapy:  APTT = 50 - 90 seconds   Plan:  4/30 @ 1159 APTT 75- level is therapeutic. Will continue with infusion rate of 0.9 mcg/kg/min. Will recheck aPTTwith AM labs. Continue to check CBCs daily    Paulina Fusi, PharmD, BCPS 06/01/2018 1:47 PM

## 2018-06-02 ENCOUNTER — Inpatient Hospital Stay: Payer: Medicare HMO

## 2018-06-02 ENCOUNTER — Encounter
Admission: RE | Admit: 2018-06-02 | Discharge: 2018-06-02 | Disposition: A | Discharge status: Home / Self Care | Payer: Medicare HMO | Source: Ambulatory Visit | Attending: Internal Medicine | Admitting: Internal Medicine

## 2018-06-02 LAB — CBC
HCT: 33.4 % — ABNORMAL LOW (ref 36.0–46.0)
Hemoglobin: 10.8 g/dL — ABNORMAL LOW (ref 12.0–15.0)
MCH: 28.1 pg (ref 26.0–34.0)
MCHC: 32.3 g/dL (ref 30.0–36.0)
MCV: 87 fL (ref 80.0–100.0)
Platelets: 447 10*3/uL — ABNORMAL HIGH (ref 150–400)
RBC: 3.84 MIL/uL — ABNORMAL LOW (ref 3.87–5.11)
RDW: 14.6 % (ref 11.5–15.5)
WBC: 7.7 10*3/uL (ref 4.0–10.5)
nRBC: 0 % (ref 0.0–0.2)

## 2018-06-02 LAB — BASIC METABOLIC PANEL
Anion gap: 8 (ref 5–15)
BUN: 7 mg/dL — ABNORMAL LOW (ref 8–23)
CO2: 27 mmol/L (ref 22–32)
Calcium: 8.4 mg/dL — ABNORMAL LOW (ref 8.9–10.3)
Chloride: 103 mmol/L (ref 98–111)
Creatinine, Ser: 0.48 mg/dL (ref 0.44–1.00)
GFR calc Af Amer: >60 mL/min (ref >60)
GFR calc non Af Amer: >60 mL/min (ref >60)
Glucose, Bld: 83 mg/dL (ref 70–99)
Potassium: 3.7 mmol/L (ref 3.5–5.1)
Sodium: 138 mmol/L (ref 135–145)

## 2018-06-02 LAB — APTT: aPTT: 92 seconds — ABNORMAL HIGH (ref 24–36)

## 2018-06-02 MED ORDER — IOHEXOL 300 MG/ML  SOLN
80.0000 mL | Freq: Once | INTRAMUSCULAR | Status: AC | PRN
  Administered 2018-06-02: 80 mL via INTRAVENOUS

## 2018-06-02 MED ORDER — MINERAL OIL PO OIL: 30.0000 mL | TOPICAL_OIL | Freq: Every day | ORAL | 0 refills | Status: AC

## 2018-06-02 MED ORDER — AMOXICILLIN-POT CLAVULANATE 875-125 MG PO TABS: 1.0000 | ORAL_TABLET | Freq: Two times a day (BID) | ORAL | 0 refills | Status: AC

## 2018-06-02 MED ORDER — IOHEXOL 240 MG/ML SOLN
25.0000 mL | INTRAMUSCULAR | Status: AC
  Administered 2018-06-02 (×2): 25 mL via INTRAVENOUS

## 2018-06-02 MED ORDER — RIVAROXABAN 20 MG PO TABS: 20.0000 mg | ORAL_TABLET | Freq: Every day | ORAL | Status: DC

## 2018-06-02 MED ORDER — POLYETHYLENE GLYCOL 3350 17 G PO PACK: 17.0000 g | PACK | Freq: Every day | ORAL | 0 refills | Status: AC

## 2018-06-02 MED ORDER — RIVAROXABAN 20 MG PO TABS
20.0000 mg | ORAL_TABLET | Freq: Once | ORAL | Status: AC
  Administered 2018-06-02: 13:00:00 20 mg via ORAL
  Filled 2018-06-02: qty 1

## 2018-06-02 NOTE — Discharge Summary (Addendum)
Patient ID: Sherry [name] MRN: [id number] DOB/AGE: [birth year] 84 y.o.  Admit date: 05/30/2018 Discharge date: 06/02/2018   Discharge Diagnoses:  Active Problems:   Acute appendicitis with appendiceal abscess   Procedures: None  Hospital Course: Patient was admitted on 05/30/2018 with the diagnosis of acute appendicitis with phlegmon and with fecal impaction.  She had a manual disimpaction of fecal impaction on the same date of admission and felt immediate relief of abdominal pain after the procedure.  Then we continued therapy with IV antibiotics for the appendicitis.  The next day will start with clear liquid diet and was advanced to full liquid diet today after.  Bowel regimen consisted of MiraLAX and mineral oil and 1 water enema during the admission.  Follow-up CT scan showed resolution of fecal impaction and resolution of large intestinal dilation.  The area of the appendix shows no worsening of the inflammation around the appendix.  The physical exam shows no abdominal pain, nontender to palpation.  There is no fever or tachycardia during the admission.  Patient tolerating diet without pain.  White blood cell count initially at admission was 12,000 and today it was 7.7.  I have discussed all these findings with the daughter [name].  I will also discuss my recommendations with Dr. Ouida Sills who is the physician that follow her at [facility] skilled nursing facility. I recommended the patient to continue the bowel regimen to avoid this to happen again.  My opinion the fecal impaction because partial obstruction of the large intestine causing pressure on the appendix producing the inflammation.  With the resolution of the fecal impaction on the antibiotic therapy she should continue healing the appendiceal area.  She will complete 14 days of antibiotic therapy.  She received Zosyn for 2 days here in the hospital and will continue with Augmentin for 11 more days.  She was treated with argatroban drip  during the admission but will continue with her usual oral anticoagulant.  Recommendations were discussed with pharmacist.  Consults: Hospitalist  Disposition: Discharge disposition: 03-Skilled Nursing Facility       Discharge Instructions    Diet full liquid   Complete by:  As directed    Increase activity slowly   Complete by:  As directed      Allergies as of 06/02/2018      Reactions   Other Swelling   Lisinopril-hydrochlorothiazide Swelling   Swelling of the tongue   Beef-derived Products Swelling   Facial and lip swelling (due to tick bite)   Dairy Aid [lactase] Swelling   Lisinopril-hydrochlorothiazide Swelling   Tongue swelling   Pork-derived Products Swelling      Medication List    TAKE these medications   acetaminophen 325 MG tablet Commonly known as:  TYLENOL Take 650 mg by mouth every 6 (six) hours.   alum & mag hydroxide-simeth 779-390-30 MG/5ML suspension Commonly known as:  MAALOX PLUS Take 30 mLs by mouth every 4 (four) hours as needed for indigestion.   amLODipine 10 MG tablet Commonly known as:  NORVASC Take 10 mg by mouth daily after supper.   amoxicillin-clavulanate 875-125 MG tablet Commonly known as:  Augmentin Take 1 tablet by mouth 2 (two) times daily for 11 days.   Beano Tabs Take 150 mg by mouth 3 (three) times daily with meals as needed (Bloating). Give 2 tabs = 300 mg   bisacodyl 10 MG suppository Commonly known as:  DULCOLAX Place 10 mg rectally daily as needed for moderate constipation.   calcium carbonate  750 MG chewable tablet Commonly known as:  TUMS EX Chew 1 tablet by mouth 3 (three) times daily.   Capsaicin 0.1 % Crea Apply 1 application topically 2 (two) times daily. Apply to left shoulder, bilateral hands, and knees   collagenase ointment Commonly known as:  SANTYL Apply nickel thick amount topically once daily.  Clean left heel wound with normal saline, skin prep periwound, apply santy nickel thick to wound bed  and normal saline gauze, cover with allevyn dressing.   DULoxetine 60 MG capsule Commonly known as:  CYMBALTA Take 60 mg by mouth daily.   ENDIT EX Apply topically to bottom / peri-area after preforming peri-care   feeding supplement (PRO-STAT SUGAR FREE 64) Liqd Take 30 mLs by mouth 4 (four) times daily.   gabapentin 400 MG capsule Commonly known as:  NEURONTIN Take 400 mg by mouth every 6 (six) hours.   Glycerin (Adult) 2.1 g Supp Place 1 suppository rectally daily at 6 (six) AM.   hydrocortisone cream 1 % Apply 1 application topically 4 (four) times daily as needed for itching.   loratadine 10 MG tablet Commonly known as:  CLARITIN Take 10 mg by mouth daily.   mineral oil liquid Take 30 mLs by mouth at bedtime for 30 days.   multivitamin with minerals Tabs tablet Take 1 tablet by mouth daily.   NON FORMULARY Diet Type:  Regular - NO BEEF, PORK OR DAIRY PRODUCTS   nortriptyline 25 MG capsule Commonly known as:  PAMELOR Take 25 mg by mouth at bedtime.   oxyCODONE 5 MG immediate release tablet Commonly known as:  Oxy IR/ROXICODONE Take 0.5 tablets (2.5 mg total) by mouth every 12 hours as needed for severe pain.   polyethylene glycol 17 g packet Commonly known as:  MIRALAX / GLYCOLAX Take 17 g by mouth at bedtime for 30 days.   Risa-Bid Probiotic Tabs Take 1 tablet by mouth daily.   rivaroxaban 20 MG Tabs tablet Commonly known as:  XARELTO Take 1 tablet (20 mg total) by mouth daily with supper.   Skin Prep Wipes Misc Apply to both heals daily   sodium chloride 0.65 % Soln nasal spray Commonly known as:  OCEAN Place 2 sprays into both nostrils 3 (three) times daily as needed for congestion.   spironolactone 25 MG tablet Commonly known as:  ALDACTONE Take 25 mg by mouth 2 (two) times daily.   tizanidine 2 MG capsule Commonly known as:  ZANAFLEX Take 2 mg by mouth every 8 (eight) hours.       This was more than 30 minutes discharge encounter more  the time orienting the patient and coordinating plan of care.

## 2018-06-02 NOTE — Discharge Instructions (Signed)
°  Diet: Resume full liquid diet for 2-3 days. If patient tolerate diet, may advance to soft diet (chicken, mashed potato, etc) for another week. If tolerates may advance to regular diet.    Activity: No contraindication for physical therapy  Medications: Resume all home medications.  Take Augmentin to complete 14 days (11 days remaining after discharge)   My recommendations is to continue a bowel regimen of Miralax and Mineral oil daily at bedtime.   I recommend to use water enema once or twice per week.   Call office [phone]) at any time if any questions, worsening pain, fevers/chills, bleeding, drainage from incision site, or other concerns.

## 2018-06-02 NOTE — Progress Notes (Signed)
Discharge order received. Patient is alert and oriented. Vital signs stable . No signs of acute distress. Discharge instructions given. Patient verbalized understanding. No other issues noted at this time.  Report given to [place]. Family was notified. Transported via EMS

## 2018-06-02 NOTE — Progress Notes (Signed)
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 Other Swelling  . Lisinopril-Hydrochlorothiazide Swelling    Swelling of the tongue  . Beef-Derived Products Swelling    Facial and lip swelling (due to tick bite)  . Dairy Aid [Lactase] Swelling  . Lisinopril-Hydrochlorothiazide Swelling    Tongue swelling  . Pork-Derived Products Swelling    Patient Measurements: Height: 5\' 6"  (167.6 cm) Weight: 164 lb 14.5 oz (74.8 kg) IBW/kg (Calculated) : 59.3 Heparin Dosing Weight:    Vital Signs: Temp: 98.1 F (36.7 C) (05/01 0535) Temp Source: Oral (05/01 0535) BP: 116/71 (05/01 0535) Pulse Rate: 84 (05/01 0535)  Labs: Recent Labs    05/30/18 1758 05/31/18 0358  06/01/18 0630 06/01/18 1019 06/01/18 1159 06/02/18 0600 06/02/18 0639  HGB 12.5 12.5  --  11.0*  --   --  10.8*  --   HCT 38.2 38.3  --  33.8*  --   --  33.4*  --   PLT 472* 463*  --  435*  --   --  447*  --   APTT  --  43*   < > 89* 75* 75*  --  92*  LABPROT  --  13.8  --   --   --   --   --   --   INR  --  1.1  --   --   --   --   --   --   CREATININE 0.45 0.41*  --   --   --   --  0.48  --   TROPONINI 0.03*  --   --   --   --   --   --   --    < > = values in this interval not displayed.    Estimated Creatinine Clearance: 57 mL/min (by C-G formula based on SCr of 0.48 mg/dL).   Medical History: Past Medical History:  Diagnosis Date  . Acute blood loss anemia   . Arthritis   . Cancer ([place])    skin  . Central cord syndrome at C6 level of cervical spinal cord ([place]) 11/29/2017  . Hypertension   . Protein-calorie malnutrition, severe ([place]) 01/24/2018  . S/P insertion of IVC (inferior vena caval) filter 01/24/2018  . 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([place])     Medications:  Medications Prior to Admission  Medication Sig Dispense Refill Last Dose  . acetaminophen (TYLENOL) 325 MG tablet Take 650 mg by mouth every 6  (six) hours.    05/30/2018 at 1357  . alum & mag hydroxide-simeth (MAALOX PLUS) 400-400-40 MG/5ML suspension Take 30 mLs by mouth every 4 (four) hours as needed for indigestion.   05/30/2018 at 1403  . amLODipine (NORVASC) 10 MG tablet Take 10 mg by mouth daily after supper.    05/30/2018 at 1618  . calcium carbonate (TUMS EX) 750 MG chewable tablet Chew 1 tablet by mouth 3 (three) times daily.   05/30/2018 at 1357  . Capsaicin 0.1 % CREA Apply 1 application topically 2 (two) times daily. Apply to left shoulder, bilateral hands, and knees   05/30/2018 at 0901  . DULoxetine (CYMBALTA) 60 MG capsule Take 60 mg by mouth daily.    05/30/2018 at 0901  . gabapentin (NEURONTIN) 400 MG capsule Take 400 mg by mouth every 6 (six) hours.   05/30/2018 at 1357  . loratadine (CLARITIN) 10 MG tablet Take 10 mg by  mouth daily.    05/30/2018 at 0901  . Multiple Vitamin (MULTIVITAMIN WITH MINERALS) TABS tablet Take 1 tablet by mouth daily.   05/30/2018 at 0901  . nortriptyline (PAMELOR) 25 MG capsule Take 25 mg by mouth at bedtime.   05/29/2018 at 2033  . Probiotic Product (RISA-BID PROBIOTIC) TABS Take 1 tablet by mouth daily.    05/30/2018 at 0901  . rivaroxaban (XARELTO) 20 MG TABS tablet Take 1 tablet (20 mg total) by mouth daily with supper. 30 tablet  05/30/2018 at 1618  . Skin Protectants, Misc. (ENDIT EX) Apply topically to bottom / peri-area after preforming peri-care   05/30/2018 at 0738  . spironolactone (ALDACTONE) 25 MG tablet Take 25 mg by mouth 2 (two) times daily.    05/30/2018 at 1700  . tizanidine (ZANAFLEX) 2 MG capsule Take 2 mg by mouth every 8 (eight) hours.    05/30/2018 at 1357  . Alpha-D-Galactosidase (BEANO) TABS Take 150 mg by mouth 3 (three) times daily with meals as needed (Bloating). Give 2 tabs = 300 mg   Unknown at prn  . Amino Acids-Protein Hydrolys (FEEDING SUPPLEMENT, PRO-STAT SUGAR FREE 64,) LIQD Take 30 mLs by mouth 4 (four) times daily.    Unknown at prn  . bisacodyl (DULCOLAX) 10 MG  suppository Place 10 mg rectally daily as needed for moderate constipation.   Unknown at prn  . collagenase (SANTYL) ointment Apply nickel thick amount topically once daily.  Clean left heel wound with normal saline, skin prep periwound, apply santy nickel thick to wound bed and normal saline gauze, cover with allevyn dressing.   Unknown at prn  . Glycerin, Adult, 2.1 g SUPP Place 1 suppository rectally daily at 6 (six) AM.  0 Unknown at prn  . hydrocortisone cream 1 % Apply 1 application topically 4 (four) times daily as needed for itching.   Unknown at prn  . NON FORMULARY Diet Type:  Regular - NO BEEF, PORK OR DAIRY PRODUCTS   Unknown at Unknown time  . Ostomy Supplies (SKIN PREP WIPES) MISC Apply to both heals daily   Unknown at Unknown time  . oxyCODONE (OXY IR/ROXICODONE) 5 MG immediate release tablet Take 0.5 tablets (2.5 mg total) by mouth every 12 hours as needed for severe pain. 14 tablet 0 Unknown at prn  . sodium chloride (OCEAN) 0.65 % SOLN nasal spray Place 2 sprays into both nostrils 3 (three) times daily as needed for congestion.   Unknown at prn    Assessment: Pharmacy consulted to dose argatroban in this 84 year old female with history of DVT.  Pt was on Xarelto at home previously, last dose of xarelto was on 4/28  @ 1600.   Pt has allergy to pork products so heparin is not indicated for this pt.    In order to dose argatroban we need a Child-Pugh score .   Relevant labs have been ordered for 4/29 AM.   Will dose argatroban when INR is resulted.   4/29- Child-Pugh score = 3,    Total bilirubin 1.2, Albumin 3.1, INR 1.1, no ascites or hepatic encephalopathy noted.  APTT 43  4/29 drip started @ 61mcg/kg/min 4/29 @ 2000 APTT 82 4/30 @ 0630 APTT 89 4/30 @ 1019 APTT 75 4/30 @1159  APTT 75 5/1   @ 0639 APTT 92  Goal of Therapy:  APTT = 50 - 90 seconds   Plan:  5/1 @ 6644 APTT=92. level is slightly supratherapeutic. Will decrease infusion rate to 0.8 mcg/kg/min. Will recheck  aPTT  in 2 hours. Continue to check CBCs daily   Chinita Greenland PharmD Clinical Pharmacist 06/02/2018

## 2018-06-02 NOTE — Care Management Important Message (Signed)
Important Message  Patient Details  Name: Sherry [name] MRN: [id number] Date of Birth: May 27, 1934   Medicare Important Message Given:  Yes    Dannette Barbara 06/02/2018, 10:55 AM

## 2018-06-02 NOTE — Progress Notes (Signed)
IV pump will only allow to input 3.6 instead of 3.59.

## 2018-06-05 DIAGNOSIS — T1490XA Injury, unspecified, initial encounter: Secondary | ICD-10-CM

## 2018-06-05 DIAGNOSIS — M792 Neuralgia and neuritis, unspecified: Secondary | ICD-10-CM

## 2018-06-05 DIAGNOSIS — K592 Neurogenic bowel, not elsewhere classified: Secondary | ICD-10-CM

## 2018-06-05 DIAGNOSIS — D72829 Elevated white blood cell count, unspecified: Secondary | ICD-10-CM

## 2018-06-08 ENCOUNTER — Non-Acute Institutional Stay (SKILLED_NURSING_FACILITY): Payer: Medicare HMO | Admitting: Adult Health

## 2018-06-08 ENCOUNTER — Encounter: Payer: Self-pay | Admitting: Adult Health

## 2018-06-08 DIAGNOSIS — S14126S Central cord syndrome at C6 level of cervical spinal cord, sequela: Secondary | ICD-10-CM

## 2018-06-08 DIAGNOSIS — K37 Unspecified appendicitis: Secondary | ICD-10-CM | POA: Diagnosis not present

## 2018-06-08 DIAGNOSIS — G8921 Chronic pain due to trauma: Secondary | ICD-10-CM

## 2018-06-08 DIAGNOSIS — R600 Localized edema: Secondary | ICD-10-CM

## 2018-06-08 DIAGNOSIS — I10 Essential (primary) hypertension: Secondary | ICD-10-CM | POA: Diagnosis not present

## 2018-06-08 DIAGNOSIS — I82501 Chronic embolism and thrombosis of unspecified deep veins of right lower extremity: Secondary | ICD-10-CM

## 2018-06-08 DIAGNOSIS — K5909 Other constipation: Secondary | ICD-10-CM

## 2018-06-08 NOTE — Progress Notes (Signed)
Location:  [facility] Number: [id number] Place of Service:  SNF (31) Provider:  Durenda Age, [place], FNP-BC  Patient Care Team: Glendon Axe, MD as PCP - General (Internal Medicine) Glendon Axe, MD (Internal Medicine)  Extended Emergency Contact Information Primary Emergency Contact: [name] Mobile Phone: [phone] Relation: Daughter Secondary Emergency Contact: Lindi Adie Address: [address]          [place], [place] [address] Johnnette Litter of [place] Phone: [phone] Relation: Spouse  Code Status:  DNR  Goals of care: Advanced Directive information Advanced Directives 06/08/2018  Does Patient Have a Medical Advance Directive? Yes  Type of Advance Directive Out of facility DNR (pink MOST or yellow form)  Does patient want to make changes to medical advance directive? No - Patient declined  Copy of [place] in Chart? -  Would patient like information on creating a medical advance directive? No - Guardian declined  Pre-existing out of facility DNR order (yellow form or pink MOST form) Yellow form placed in chart (order not valid for inpatient use)     Chief Complaint  Patient presents with   Medical Management of Chronic Issues    Routine [facility] visit    HPI:  Pt is an 84 y.o. female seen today for medical management of chronic diseases.  She is a short-term care resident of [facility] She has a PMH of skin cancer, central cord syndrome, hypertension, severe protein-calorie malnutrition, and tetraparesis. She was hospitalized at [facility] 4/28-06/02/18 for acute appendicitis with appendiceal abscess. She was diagnosed, as well, in the hospital with fecal impaction which was thought to be causing partial obstruction of the large intestine causing pressure on the appendix reducing the inflammation.  She had a manual disimpaction in the hospital, received Zosyn for 2 days and and will  have Augmentin for a total of 11 days.  Follow-up CT scan showed resolution of fecal impaction and resolution of large intestinal dilation.  The area of the appendix shows no worsening of the inflammation around the appendix.  Her white blood cell count was initially at 12,000 and down to was down to 7.7 .    Past Medical History:  Diagnosis Date   Acute blood loss anemia    Arthritis    Cancer ([place])    skin   Central cord syndrome at C6 level of cervical spinal cord ([place]) 11/29/2017   Hypertension    Protein-calorie malnutrition, severe ([place]) 01/24/2018   S/P insertion of IVC (inferior vena caval) filter 01/24/2018   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([place])    Past Surgical History:  Procedure Laterality Date   ANTERIOR CERVICAL DECOMP/DISCECTOMY FUSION N/A 11/29/2017   Procedure: Cervical five-six, six-seven Anterior Cervical Decompression Fusion;  Surgeon: Judith Part, MD;  Location: [facility];  Service: Neurosurgery;  Laterality: N/A;  Cervical five-six, six-seven Anterior Cervical Decompression Fusion   CATARACT EXTRACTION     IR IVC FILTER PLMT / S&I /IMG GUID/MOD SED  12/08/2017    Allergies  Allergen Reactions   Other Swelling   Lisinopril-Hydrochlorothiazide Swelling    Swelling of the tongue   Beef-Derived Products Swelling    Facial and lip swelling (due to tick bite)   Dairy Aid [Lactase] Swelling   Lisinopril-Hydrochlorothiazide Swelling    Tongue swelling   Pork-Derived Products Swelling    Outpatient Encounter Medications as of 06/08/2018  Medication Sig   acetaminophen (TYLENOL) 325 MG tablet Take 650 mg by mouth every 6 (  six) hours.    Alpha-D-Galactosidase (BEANO) TABS Take 150 mg by mouth 3 (three) times daily with meals as needed (Bloating). Give 2 tabs = 300 mg   alum & mag hydroxide-simeth (MAALOX PLUS) 400-400-40 MG/5ML suspension Take 30 mLs by mouth every 4 (four) hours as needed for indigestion.   amLODipine (NORVASC) 10 MG tablet Take 10 mg by mouth  daily after supper.    amoxicillin-clavulanate (AUGMENTIN) 875-125 MG tablet Take 1 tablet by mouth 2 (two) times daily for 11 days.   bisacodyl (DULCOLAX) 10 MG suppository Place 10 mg rectally daily as needed for moderate constipation.   calcium carbonate (TUMS EX) 750 MG chewable tablet Chew 1 tablet by mouth 3 (three) times daily.   Capsaicin 0.1 % CREA Apply 1 application topically 2 (two) times daily. Apply to left shoulder, bilateral hands, and knees   DULoxetine (CYMBALTA) 60 MG capsule Take 60 mg by mouth daily.    gabapentin (NEURONTIN) 400 MG capsule Take 400 mg by mouth every 6 (six) hours.   Glycerin, Adult, 2.1 g SUPP Place 1 suppository rectally daily at 6 (six) AM.   loratadine (CLARITIN) 10 MG tablet Take 10 mg by mouth daily.    magnesium hydroxide (MILK OF MAGNESIA) 400 MG/5ML suspension Take 30 mLs by mouth every 4 (four) hours as needed for mild constipation.   mineral oil liquid Take 30 mLs by mouth at bedtime for 30 days.   Multiple Vitamin (MULTIVITAMIN WITH MINERALS) TABS tablet Take 1 tablet by mouth daily.   NON FORMULARY Diet Type:  Regular - NO BEEF, PORK OR DAIRY PRODUCTS   nortriptyline (PAMELOR) 25 MG capsule Take 25 mg by mouth at bedtime.   Ostomy Supplies (SKIN PREP WIPES) MISC Apply to both heals daily   oxyCODONE (OXY IR/ROXICODONE) 5 MG immediate release tablet Take 0.5 tablets (2.5 mg total) by mouth every 12 hours as needed for severe pain.   polyethylene glycol (MIRALAX / GLYCOLAX) 17 g packet Take 17 g by mouth at bedtime for 30 days.   Probiotic Product (RISA-BID PROBIOTIC) TABS Take 1 tablet by mouth daily.    rivaroxaban (XARELTO) 20 MG TABS tablet Take 1 tablet (20 mg total) by mouth daily with supper.   Skin Protectants, Misc. (ENDIT EX) Apply topically to bottom / peri-area after preforming peri-care   spironolactone (ALDACTONE) 25 MG tablet Take 25 mg by mouth 2 (two) times daily.    tizanidine (ZANAFLEX) 2 MG capsule Take 2  mg by mouth every 8 (eight) hours.    [DISCONTINUED] Amino Acids-Protein Hydrolys (FEEDING SUPPLEMENT, PRO-STAT SUGAR FREE 64,) LIQD Take 30 mLs by mouth 4 (four) times daily.    [DISCONTINUED] collagenase (SANTYL) ointment Apply nickel thick amount topically once daily.  Clean left heel wound with normal saline, skin prep periwound, apply santy nickel thick to wound bed and normal saline gauze, cover with allevyn dressing.   [DISCONTINUED] hydrocortisone cream 1 % Apply 1 application topically 4 (four) times daily as needed for itching.   [DISCONTINUED] sodium chloride (OCEAN) 0.65 % SOLN nasal spray Place 2 sprays into both nostrils 3 (three) times daily as needed for congestion.   No facility-administered encounter medications on file as of 06/08/2018.     Review of Systems  GENERAL: No change in appetite, no fever or chills  MOUTH and THROAT: Denies oral discomfort, gingival pain or bleeding, pain from teeth or hoarseness   RESPIRATORY: no cough, SOB, DOE, wheezing, hemoptysis CARDIAC: No chest pain, or palpitations GI: No abdominal  pain, diarrhea, constipation, heart burn, nausea or vomiting GU: Denies dysuria, frequency, hematuria, incontinence, or discharge PSYCHIATRIC: Denies feelings of depression or anxiety. No report of hallucinations, insomnia, paranoia, or agitation    Immunization History  Administered Date(s) Administered   Tdap 11/29/2017   Pertinent  Health Maintenance Due  Topic Date Due   PNA vac Low Risk Adult (1 of 2 - PCV13) 04/13/2019 (Originally 09/03/2001)   INFLUENZA VACCINE  09/02/2018   DEXA SCAN  Discontinued   Fall Risk  02/22/2018  Falls in the past year? Exclusion - non ambulatory  Comment hoyer lift     Vitals:   06/08/18 0857  BP: 111/72  Pulse: 84  Resp: 20  Temp: 97.6 F (36.4 C)  TempSrc: Oral  SpO2: 94%  Weight: 166 lb 6.4 oz (75.5 kg)  Height: 5\' 6"  (1.676 m)   Body mass index is 26.86 kg/m.  Physical Exam  GENERAL  APPEARANCE: Well nourished. In no acute distress. Normal body habitus SKIN:  Skin is warm and dry.  MOUTH and THROAT: Lips are without lesions. Oral mucosa is moist and without lesions. Tongue is normal in shape, size, and color and without lesions RESPIRATORY: Breathing is even & unlabored, BS CTAB CARDIAC: RRR, no murmur,no extra heart sounds, BLE 2+ edema GI: Abdomen soft, normal BS, no masses, no tenderness GU:  Has foley catheter draining to urine bag NEUROLOGICAL: There is no tremor. Speech is clear. Tetraparesis. Alert and oriented X 3. PSYCHIATRIC:  Affect and behavior are appropriate  Labs reviewed: Recent Labs    03/10/18 0530 03/13/18 0642  05/30/18 1758 05/31/18 0358 06/02/18 0600  NA 137 138   < > 134* 135 138  K 3.7 3.9   < > 4.2 4.3 3.7  CL 103 103   < > 96* 98 103  CO2 29 28   < > 26 28 27   GLUCOSE 93 87   < > 116* 99 83  BUN 15 16   < > 15 12 7*  CREATININE 0.31* 0.34*   < > 0.45 0.41* 0.48  CALCIUM 8.6* 8.8*   < > 8.8* 8.7* 8.4*  MG 1.9 1.9  --   --  2.2  --   PHOS  --   --   --   --  4.9*  --    < > = values in this interval not displayed.   Recent Labs    02/20/18 1653  05/30/18 0600 05/30/18 1758 05/31/18 0358  AST 39  --   --  24 20  ALT 21  --   --  17 17  ALKPHOS 85  --   --  118 118  BILITOT 0.7  --   --  1.0 1.2  PROT 5.9*   < > 6.5 6.4* 6.5  ALBUMIN 2.8*   < > 3.2* 3.3* 3.1*   < > = values in this interval not displayed.   Recent Labs    02/20/18 1653 04/14/18 0610 05/30/18 1758 05/31/18 0358 06/01/18 0630 06/02/18 0600  WBC 10.2 6.4 12.7* 11.1* 10.3 7.7  NEUTROABS 6.7 3.0 8.5*  --   --   --   HGB 12.3 11.1* 12.5 12.5 11.0* 10.8*  HCT 40.0 34.2* 38.2 38.3 33.8* 33.4*  MCV 90.9 89.3 86.4 87.2 86.7 87.0  PLT 524* 410* 472* 463* 435* 447*    Significant Diagnostic Results in last 30 days:  Ct Abdomen Pelvis W Contrast  Result Date: 06/02/2018 CLINICAL DATA:  Acute appendicitis with  periappendiceal phlegmon. Fecal impaction.  Abdominal pain and fever. EXAM: CT ABDOMEN AND PELVIS WITH CONTRAST TECHNIQUE: Multidetector CT imaging of the abdomen and pelvis was performed using the standard protocol following bolus administration of intravenous contrast. CONTRAST:  14mL OMNIPAQUE IOHEXOL 300 MG/ML  SOLN COMPARISON:  CT scan dated 05/30/2018 FINDINGS: Lower chest: Persistent focal atelectasis at the left lung base posterior medially. Persistent slight linear atelectasis at the right lung base posteriorly. Hepatobiliary: Focal fatty infiltration in the left lobe of the liver adjacent to the falciform ligament. Tiny cyst in the dome of the liver. Biliary tree is normal. Pancreas: Unremarkable. No pancreatic ductal dilatation or surrounding inflammatory changes. Spleen: Normal in size without focal abnormality. Adrenals/Urinary Tract: Normal adrenal glands. Stable small cysts in both kidneys. No hydronephrosis. Foley catheter in the bladder with air in the bladder. Stomach/Bowel: The appendix is enlarged and dilated distally containing fluid as demonstrated on the prior CT scan. There is a phlegmon surrounding the tip of the appendix in the right adnexa. This is slightly more prominent than on the prior exam. There is a new small amount of ascites in the pelvis. The fecal impaction in the rectum seen on the prior study is no longer present. There is no significant residual stool in the colon. There is contrast throughout the colon including in the rectum. The wall of the rectum is slightly thickened. The cecum is no longer distended. Vascular/Lymphatic: IVC filter in place. Aortic atherosclerosis. No adenopathy. Reproductive: Calcified fibroids in the uterus. Left ovary is normal. Right ovary is obscured by the phlegmon described above. Other: New small amount of free fluid in the pelvis. No abdominal wall hernias. Slight progression of subcutaneous edema in the pelvis and hips. Musculoskeletal: No acute abnormality. Chronic degenerative changes  in the facet joints of the lumbar spine with grade 1 spondylolisthesis at L4-5. Slight arthritic changes of both hips. IMPRESSION: Minimal progression of the periappendiceal phlegmon in the right lower quadrant. Dilated fluid-filled distal appendix is again noted consistent with acute appendicitis. No abscess extrinsic to the appendix is identified. Resolution of fecal impaction and colon distention. Electronically Signed   By: Lorriane Shire M.D.   On: 06/02/2018 09:53   Ct Abdomen Pelvis W Contrast  Result Date: 05/30/2018 CLINICAL DATA:  84 year old female with abdominal swelling for 4 days and pain. History of spinal cord injury in October due to fall, T11 vertebral fracture. EXAM: CT ABDOMEN AND PELVIS WITH CONTRAST TECHNIQUE: Multidetector CT imaging of the abdomen and pelvis was performed using the standard protocol following bolus administration of intravenous contrast. CONTRAST:  157mL OMNIPAQUE IOHEXOL 300 MG/ML  SOLN COMPARISON:  CT Chest, Abdomen, and Pelvis 11/28/2017 FINDINGS: Lower chest: Left lower lobe medial basal segment atelectasis. Mild right costophrenic angle atelectasis. Borderline to mild cardiomegaly. No pericardial or pleural effusion. Hepatobiliary: Negative liver and gallbladder. Pancreas: Negative. Spleen: Negative. Adrenals/Urinary Tract: Normal adrenal glands. Bilateral renal enhancement and contrast excretion is symmetric and within normal limits. Simple appearing bilateral renal cysts. The urinary bladder is decompressed by a Foley catheter. Stomach/Bowel: There is a 9 centimeter stool ball in the rectum and some incontinence of stool distally. There is perirectal stranding. The sigmoid colon is redundant with a retained gas and low-density stool, but is nondilated. Similar gas and low-density stool retained throughout the more proximal colon. The hepatic flexure is redundant. The cecum is dilated up to about 7 centimeters diameter. The terminal ileum is gas-filled but  nondilated on series 2, image 58. Best seen on coronal  images series 5, images 29-51 the appendix arises along the inferior surface of the dilated cecum near the midline and initially appears normal (images 32-35), but as it tracks laterally the calms abnormally dilated and appears to contain fluid (14 millimeters diameter image 42). At the tip of the appendix there is of 6 centimeter phlegmon like area which is inseparable from the proximal right lateral wall of the rectum (coronal image 51 and series 2, image 59. This is also inseparable from the right gonadal vein, although previously both ovaries were diminutive. No free air or extraluminal gas identified. No free fluid identified. Oral contrast was administered and has reached the distal small bowel but not yet the terminal ileum. No dilated small bowel. Small gastric hiatal hernia. Otherwise negative stomach. Vascular/Lymphatic: IVC filter in place. Diminutive IVC. Aortoiliac calcified atherosclerosis. Major arterial structures are patent with mild generalized arterial tortuosity. Portal venous system is patent. No lymphadenopathy. Reproductive: Stable with mild uterine fibroid related calcification. Other: No pelvic free fluid, but presacral stranding. Musculoskeletal: Partially healed T11 vertebral body fracture (series 6, image 67). Mild chronic lower lumbar spondylolisthesis. Hip joint degeneration. No new osseous abnormality identified. IMPRESSION: 1. Highly suspicious for Acute Appendicitis superimposed on fecal impaction with dilated cecum and colon with retained stool throughout. The proximal appendix is normal as it arises from the inferior dilated cecum on Coronal series 5, image 29. But the distal appendix becomes abnormally dilated and terminates at a 6 cm phlegmon in the right lower quadrant which appears inseparable from the dilated rectum. A contained perforation at the tip might be possible although there is no extraluminal gas or free fluid.  No dilated small bowel. 2. Presacral stranding related to fecal impaction. 3. Partially healed T11 vertebral body fracture. Study discussed by telephone with Dr. Charlotte Crumb on 05/30/2018 at 20:04 . Electronically Signed   By: Genevie Ann M.D.   On: 05/30/2018 20:13    Assessment/Plan  1. Appendicitis, unspecified appendicitis type -Was given Zosyn x2 in the hospital, will continue Augmentin for a total of 11 days  2. Central cord syndrome at C6 level of cervical spinal cord, sequela (HCC) - has tetraparesis, continue PT and OT, has foley catheter, turn to sides when in bed, continue tizanidine 2 mg 1 capsule every 8 hours  3. Benign essential HTN -Stable, continue amlodipine 10 mg 1 tab daily  4. Chronic deep vein thrombosis (DVT) of right lower extremity, unspecified vein (HCC) -Continue Xarelto 20 mg 1 tab daily  5. Bilateral lower extremity edema -Continue Aldactone 25 mg 1 tab twice a day  6. Chronic pain due to trauma -Continue oxycodone 5 mg 1/2 tab = 2.5 mg every 12 hours, nortriptyline 25 mg 1 capsule at bedtime, Cymbalta 60 mg 1 capsule daily, gabapentin 400 mg 1 capsule every 6 hours  7. Chronic constipation -Had disimpaction in the hospital, continue mineral oil, MiraLAX and glycerin suppository     Family/ staff Communication: Discussed plan of care with resident.  Labs/tests ordered:  None  Goals of care:   Short-term care.   Durenda Age, DNP, FNP-BC [facility] [phone] (Monday-Friday 8:00 a.m. - 5:00 p.m.) [phone] (after hours)

## 2018-06-14 ENCOUNTER — Other Ambulatory Visit
Admission: RE | Admit: 2018-06-14 | Discharge: 2018-06-14 | Disposition: A | Discharge status: Home / Self Care | Payer: Medicare HMO | Source: Skilled Nursing Facility | Attending: Internal Medicine | Admitting: Internal Medicine

## 2018-06-14 ENCOUNTER — Other Ambulatory Visit: Payer: Self-pay | Admitting: Internal Medicine

## 2018-06-14 ENCOUNTER — Ambulatory Visit
Admission: RE | Admit: 2018-06-14 | Discharge: 2018-06-14 | Disposition: A | Discharge status: Home / Self Care | Payer: Medicare HMO | Source: Ambulatory Visit | Attending: Internal Medicine | Admitting: Internal Medicine

## 2018-06-14 ENCOUNTER — Other Ambulatory Visit: Payer: Self-pay

## 2018-06-14 DIAGNOSIS — K37 Unspecified appendicitis: Secondary | ICD-10-CM | POA: Insufficient documentation

## 2018-06-14 LAB — COMPREHENSIVE METABOLIC PANEL
ALT: 27 U/L (ref 0–44)
AST: 38 U/L (ref 15–41)
Albumin: 3.1 g/dL — ABNORMAL LOW (ref 3.5–5.0)
Alkaline Phosphatase: 84 U/L (ref 38–126)
Anion gap: 10 (ref 5–15)
BUN: 11 mg/dL (ref 8–23)
CO2: 28 mmol/L (ref 22–32)
Calcium: 9 mg/dL (ref 8.9–10.3)
Chloride: 99 mmol/L (ref 98–111)
Creatinine, Ser: 0.35 mg/dL — ABNORMAL LOW (ref 0.44–1.00)
GFR calc Af Amer: >60 mL/min (ref >60)
GFR calc non Af Amer: >60 mL/min (ref >60)
Glucose, Bld: 99 mg/dL (ref 70–99)
Potassium: 4.2 mmol/L (ref 3.5–5.1)
Sodium: 137 mmol/L (ref 135–145)
Total Bilirubin: 0.2 mg/dL — ABNORMAL LOW (ref 0.3–1.2)
Total Protein: 6.3 g/dL — ABNORMAL LOW (ref 6.5–8.1)

## 2018-06-14 LAB — CBC WITH DIFFERENTIAL/PLATELET
Abs Immature Granulocytes: 0.02 10*3/uL (ref 0.00–0.07)
Basophils Absolute: 0.1 10*3/uL (ref 0.0–0.1)
Basophils Relative: 1 %
Eosinophils Absolute: 0.2 10*3/uL (ref 0.0–0.5)
Eosinophils Relative: 3 %
HCT: 36.8 % (ref 36.0–46.0)
Hemoglobin: 12 g/dL (ref 12.0–15.0)
Immature Granulocytes: 0 %
Lymphocytes Relative: 38 %
Lymphs Abs: 2.8 10*3/uL (ref 0.7–4.0)
MCH: 28.4 pg (ref 26.0–34.0)
MCHC: 32.6 g/dL (ref 30.0–36.0)
MCV: 87.2 fL (ref 80.0–100.0)
Monocytes Absolute: 0.4 10*3/uL (ref 0.1–1.0)
Monocytes Relative: 5 %
Neutro Abs: 3.8 10*3/uL (ref 1.7–7.7)
Neutrophils Relative %: 53 %
Platelets: 554 10*3/uL — ABNORMAL HIGH (ref 150–400)
RBC: 4.22 MIL/uL (ref 3.87–5.11)
RDW: 15.1 % (ref 11.5–15.5)
WBC: 7.2 10*3/uL (ref 4.0–10.5)
nRBC: 0 % (ref 0.0–0.2)

## 2018-06-14 LAB — POCT I-STAT CREATININE: Creatinine, Ser: 0.5 mg/dL (ref 0.44–1.00)

## 2018-06-14 MED ORDER — IOHEXOL 300 MG/ML  SOLN
100.0000 mL | Freq: Once | INTRAMUSCULAR | Status: AC | PRN
  Administered 2018-06-14: 17:00:00 100 mL via INTRAVENOUS

## 2018-07-03 ENCOUNTER — Encounter
Admission: RE | Admit: 2018-07-03 | Discharge: 2018-07-03 | Disposition: A | Discharge status: Home / Self Care | Payer: Medicare HMO | Source: Ambulatory Visit | Attending: Internal Medicine | Admitting: Internal Medicine

## 2018-07-10 ENCOUNTER — Encounter: Payer: Self-pay | Admitting: Adult Health

## 2018-07-10 ENCOUNTER — Non-Acute Institutional Stay (SKILLED_NURSING_FACILITY): Payer: Medicare HMO | Admitting: Adult Health

## 2018-07-10 ENCOUNTER — Other Ambulatory Visit
Admission: RE | Admit: 2018-07-10 | Discharge: 2018-07-10 | Disposition: A | Discharge status: Home / Self Care | Payer: Medicare HMO | Source: Skilled Nursing Facility | Attending: Adult Health | Admitting: Adult Health

## 2018-07-10 DIAGNOSIS — M792 Neuralgia and neuritis, unspecified: Secondary | ICD-10-CM | POA: Diagnosis not present

## 2018-07-10 DIAGNOSIS — I10 Essential (primary) hypertension: Secondary | ICD-10-CM | POA: Diagnosis not present

## 2018-07-10 DIAGNOSIS — I825Y2 Chronic embolism and thrombosis of unspecified deep veins of left proximal lower extremity: Secondary | ICD-10-CM

## 2018-07-10 DIAGNOSIS — K5909 Other constipation: Secondary | ICD-10-CM

## 2018-07-10 DIAGNOSIS — R600 Localized edema: Secondary | ICD-10-CM

## 2018-07-10 DIAGNOSIS — R1084 Generalized abdominal pain: Secondary | ICD-10-CM | POA: Diagnosis not present

## 2018-07-10 DIAGNOSIS — N319 Neuromuscular dysfunction of bladder, unspecified: Secondary | ICD-10-CM | POA: Diagnosis present

## 2018-07-10 LAB — URINALYSIS, COMPLETE (UACMP) WITH MICROSCOPIC
Bilirubin Urine: NEGATIVE
Glucose, UA: NEGATIVE mg/dL
Hgb urine dipstick: NEGATIVE
Ketones, ur: NEGATIVE mg/dL
Nitrite: POSITIVE — AB
Protein, ur: NEGATIVE mg/dL
Specific Gravity, Urine: 1.006 (ref 1.005–1.030)
Squamous Epithelial / HPF: NONE SEEN (ref 0–5)
pH: 7 (ref 5.0–8.0)

## 2018-07-10 NOTE — Progress Notes (Signed)
Location:  [facility] Number: [id number] Place of Service:  SNF (31) Provider:  Durenda Age, [place], FNP-BC  Patient Care Team: Glendon Axe, MD as PCP - General (Internal Medicine) Glendon Axe, MD (Internal Medicine)  Extended Emergency Contact Information Primary Emergency Contact: [name] Mobile Phone: [phone] Relation: Daughter Secondary Emergency Contact: Lindi Adie Address: [address]          [place], [place] [address] Johnnet[place] L[place] of [place] Phone: [phone] Relation: Spouse  Code Status:  Full Code  Goals of care: Advanced Directive information Advanced Directives 06/08/2018  Does Patient Have a Medical Advance Directive? Yes  Type of Advance Directive Out of facility DNR (pink MOST or yellow form);[place];Living will  Does patient want to make changes to medical advance directive? No - Patient declined  Copy of [place] in Chart? Yes - validated most recent copy scanned in chart (See row information)  Would patient like information on creating a medical advance directive? No - Guardian declined  Pre-existing out of facility DNR order (yellow form or pink MOST form) -     Chief Complaint  Patient presents with  . Medical Management of Chronic Issues    Routine TVAB visit    HPI:  Pt is a 84 y.o. female seen today for medical management of chronic diseases. She has PMH of skin cancer, central cord syndrome, hypertension, severe protein-calorie malnutrition and tetraparesis. She complained of gas/abdominal pain. Abdomen is soft and nontender. She has chronic foley catheter and noted to have mild whitish sediments. She said that sediments are not as much as when she was taking pro-stat. No reported fever nor hematuria. Staff reported that she had large formed BMs. She is currently taking Senna S and Lactulose.    Past Medical History:  Diagnosis Date  .  Acute blood loss anemia   . Arthritis   . Cancer ([place])    skin  . Central cord syndrome at C6 level of cervical spinal cord ([place]) 11/29/2017  . Hypertension   . Protein-calorie malnutrition, severe ([place]) 01/24/2018  . S/P insertion of IVC (inferior vena caval) filter 01/24/2018  . 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[facility])    Past Surgical History:  Procedure Laterality Date  . ANTERIOR CERVICAL DECOMP/DISCECTOMY FUSION N/A 11/29/2017   Procedure: Cervical five-six, six-seven Anterior Cervical Decompression Fusion;  Surgeon: Judith Part, MD;  Location: [facility];  Service: Neurosurgery;  Laterality: N/A;  Cervical five-six, six-seven Anterior Cervical Decompression Fusion  . CATARACT EXTRACTION    . IR IVC FILTER PLMT / S&I /IMG GUID/MOD SED  12/08/2017    Allergies  Allergen Reactions  . Other Swelling  . Lisinopril-Hydrochlorothiazide Swelling    Swelling of the tongue  . Beef-Derived Products Swelling    Facial and lip swelling (due to tick bite)  . Dairy Aid [Lactase] Swelling  . Lisinopril-Hydrochlorothiazide Swelling    Tongue swelling  . Pork-Derived Products Swelling    Outpatient Encounter Medications as of 07/10/2018  Medication Sig  . acetaminophen (TYLENOL) 325 MG tablet Take 650 mg by mouth every 6 (six) hours.   . Alpha-D-Galactosidase (BEANO) TABS Take 150 mg by mouth 3 (three) times daily with meals as needed (Bloating). Give 2 tabs = 300 mg  . alum & mag hydroxide-simeth (MAALOX PLUS) 400-400-40 MG/5ML suspension Take 30 mLs by mouth every 4 (four) hours as needed for indigestion.  Marland Kitchen amLODipine (NORVASC) 10 MG tablet Take 10 mg by mouth daily after  supper.   . bisacodyl (DULCOLAX) 10 MG suppository Place 10 mg rectally daily as needed for moderate constipation.  . calcium carbonate (TUMS EX) 750 MG chewable tablet Chew 1 tablet by mouth 3 (three) times daily.  . Capsaicin 0.1 % CREA Apply 1 application topically 2 (two) times daily. Apply to left shoulder, bilateral hands, and knees   . DULoxetine (CYMBALTA) 60 MG capsule Take 60 mg by mouth daily.   Marland Kitchen gabapentin (NEURONTIN) 400 MG capsule Take 400 mg by mouth every 6 (six) hours.  Marland Kitchen lactulose (CHRONULAC) 10 GM/15ML solution Take 30 g by mouth daily.  Marland Kitchen loratadine (CLARITIN) 10 MG tablet Take 10 mg by mouth daily.   . magnesium hydroxide (MILK OF MAGNESIA) 400 MG/5ML suspension Take 30 mLs by mouth every 4 (four) hours as needed for mild constipation.  . Multiple Vitamin (MULTIVITAMIN WITH MINERALS) TABS tablet Take 1 tablet by mouth daily.  . NON FORMULARY Diet Type:  Regular - NO BEEF, PORK OR DAIRY PRODUCTS  . nortriptyline (PAMELOR) 25 MG capsule Take 25 mg by mouth at bedtime.  Oneta Rack Supplies (SKIN PREP WIPES) MISC Apply to both heals daily  . oxyCODONE (OXY IR/ROXICODONE) 5 MG immediate release tablet Take 0.5 tablets (2.5 mg total) by mouth every 12 hours as needed for severe pain.  . Probiotic Product (RISA-BID PROBIOTIC) TABS Take 1 tablet by mouth daily.   . rivaroxaban (XARELTO) 20 MG TABS tablet Take 1 tablet (20 mg total) by mouth daily with supper.  . sennosides-docusate sodium (SENOKOT-S) 8.6-50 MG tablet Take 2 tablets by mouth 2 (two) times a day.  . Skin Protectants, Misc. (ENDIT EX) Apply topically to bottom / peri-area after preforming peri-care  . sodium phosphate (FLEET) 7-19 GM/118ML ENEM Place 1 enema rectally daily as needed for severe constipation.  Marland Kitchen spironolactone (ALDACTONE) 25 MG tablet Take 25 mg by mouth 2 (two) times daily.   . tizanidine (ZANAFLEX) 2 MG capsule Take 2 mg by mouth every 8 (eight) hours.   . [DISCONTINUED] Glycerin, Adult, 2.1 g SUPP Place 1 suppository rectally daily at 6 (six) AM.   No facility-administered encounter medications on file as of 07/10/2018.     Review of Systems  GENERAL: No change in appetite, no fatigue, no weight changes, no fever, chills or weakness MOUTH and THROAT: Denies oral discomfort, gingival pain or bleeding RESPIRATORY: no cough, SOB, DOE,  wheezing, hemoptysis CARDIAC: No chest pain, or palpitations GI: No abdominal pain, diarrhea, constipation, heart burn, nausea or vomiting NEUROLOGICAL: Denies dizziness, syncope, numbness, or headache PSYCHIATRIC: Denies feelings of depression or anxiety. No report of hallucinations, insomnia, paranoia, or agitation   Immunization History  Administered Date(s) Administered  . Tdap 11/29/2017   Pertinent  Health Maintenance Due  Topic Date Due  . PNA vac Low Risk Adult (1 of 2 - PCV13) 04/13/2019 (Originally 09/03/2001)  . INFLUENZA VACCINE  09/02/2018  . DEXA SCAN  Discontinued   Fall Risk  02/22/2018  Falls in the past year? Exclusion - non ambulatory  Comment hoyer lift     Vitals:   07/10/18 1015  BP: 108/69  Pulse: 87  Resp: 12  Temp: 97.7 F (36.5 C)  TempSrc: Oral  SpO2: 92%  Weight: 165 lb 4.8 oz (75 kg)  Height: 5\' 7"  (1.702 m)   Body mass index is 25.89 kg/m.  Physical Exam  GENERAL APPEARANCE:  In no acute distress. Normal body habitus MOUTH and THROAT: Lips are without lesions. Oral mucosa is moist and  without lesions. Tongue is normal in shape, size, and color and without lesions RESPIRATORY: Breathing is even & unlabored, BS CTAB CARDIAC: RRR, no murmur,no extra heart sounds, BLE 2+ edema GI: Abdomen soft, normal BS, no masses, no tenderness NEUROLOGICAL: There is no tremor. Speech is clear. Able to move BUE.Alert and oriented X 3.  PSYCHIATRIC:  Affect and behavior are appropriate  Labs reviewed: Recent Labs    03/10/18 0530 03/13/18 0642  05/31/18 0358 06/02/18 0600 06/14/18 1343 06/14/18 1631  NA 137 138   < > 135 138 137  --   K 3.7 3.9   < > 4.3 3.7 4.2  --   CL 103 103   < > 98 103 99  --   CO2 29 28   < > 28 27 28   --   GLUCOSE 93 87   < > 99 83 99  --   BUN 15 16   < > 12 7* 11  --   CREATININE 0.31* 0.34*   < > 0.41* 0.48 0.35* 0.50  CALCIUM 8.6* 8.8*   < > 8.7* 8.4* 9.0  --   MG 1.9 1.9  --  2.2  --   --   --   PHOS  --   --   --   4.9*  --   --   --    < > = values in this interval not displayed.   Recent Labs    05/30/18 1758 05/31/18 0358 06/14/18 1343  AST 24 20 38  ALT 17 17 27   ALKPHOS 118 118 84  BILITOT 1.0 1.2 0.2*  PROT 6.4* 6.5 6.3*  ALBUMIN 3.3* 3.1* 3.1*   Recent Labs    04/14/18 0610 05/30/18 1758  06/01/18 0630 06/02/18 0600 06/14/18 1343  WBC 6.4 12.7*   < > 10.3 7.7 7.2  NEUTROABS 3.0 8.5*  --   --   --  3.8  HGB 11.1* 12.5   < > 11.0* 10.8* 12.0  HCT 34.2* 38.2   < > 33.8* 33.4* 36.8  MCV 89.3 86.4   < > 86.7 87.0 87.2  PLT 410* 472*   < > 435* 447* 554*   < > = values in this interval not displayed.     Significant Diagnostic Results in last 30 days:  Ct Abdomen Pelvis W Contrast  Result Date: 06/14/2018 CLINICAL DATA:  Bloating and swelling for 2-3 days. Recent appendicitis and bowel obstruction. EXAM: CT ABDOMEN AND PELVIS WITH CONTRAST TECHNIQUE: Multidetector CT imaging of the abdomen and pelvis was performed using the standard protocol following bolus administration of intravenous contrast. CONTRAST:  176mL OMNIPAQUE IOHEXOL 300 MG/ML  SOLN COMPARISON:  06/02/2018 FINDINGS: Lower chest: Left base atelectasis or scar. Mild cardiomegaly without pericardial or pleural effusion. Tiny hiatal hernia. Hepatobiliary: Focal steatosis adjacent the falciform ligament. Normal gallbladder, without biliary ductal dilatation. Pancreas: Normal, without mass or ductal dilatation. Spleen: Normal in size, without focal abnormality. Adrenals/Urinary Tract: Normal adrenal glands. Bilateral renal too small to characterize lesions. An upper pole 1.6 cm left renal cyst. No hydronephrosis. Foley catheter within the urinary bladder. Bladder wall thickening is at least partially felt to be due to underdistention on image 73/2. Stomach/Bowel: Normal remainder of the stomach. Development of large colonic stool burden, including an 8 cm stool ball in the rectum. The proximal appendix is normal, including image  52/2. Distal appendix tracks towards an area of soft tissue fullness which is intimately associated with the right ovary/adnexa. Therefore difficult  to differentiate. Felt to be on the order of 2.3 x 1.6 cm on image 56/2. Compare 2.7 x 2.8 cm on the prior exam (when remeasured). No small bowel dilatation. Vascular/Lymphatic: Aortic and branch vessel atherosclerosis. IVC filter appropriately position. No abdominopelvic adenopathy. Reproductive: Calcified uterine fibroids.  No adnexal mass. Other: No significant free fluid. Musculoskeletal: Osteopenia.  Thoracolumbar spondylosis. IMPRESSION: 1. Large colorectal stool burden,consistent with constipation and fecal impaction. 2. Periappendiceal phlegmon, felt to be decreased but difficult to differentiate from the adjacent ovary. No evidence of more proximal appendicitis. 3. Uterine fibroids. 4. Bladder wall thickening, at least partially felt to be due to underdistention. Cystitis cannot be excluded. Electronically Signed   By: Abigail Miyamoto M.D.   On: 06/14/2018 17:08    Assessment/Plan  1. Generalized abdominal pain - she has been moving her bowels bu bowel sounds noted to be hypoactive, will get KUB done to rule out bowel obstruction, urinalysis to rule out UTI  2. Chronic deep vein thrombosis (DVT) of proximal vein of left lower extremity (HCC) -Continue Xarelto 20 mg 1 tab daily  3. Neuropathic pain - stable, continue gabapentin 400 mg 1 capsule every 6 hours, acetaminophen 325 mg 2 tabs = 650 mg every 6 hours, nortriptyline 25 mg 1 capsule at bedtime, oxycodone 5 mg 1/2 tab = 2.5 mg every 12 hours as needed, tizanidine 2 mg 1 capsule every 8 hours and Cymbalta 60 mg 1 capsule daily  4. Essential hypertension -Well-controlled, continue amlodipine 10 mg 1 tab daily  5. Chronic constipation -Continue senna S 8.6-50 mg 2 tabs twice a day and lactulose 10 g / 15 mL give 30 mL daily  6.  Bilateral lower extremity edema -Continue Aldactone 25 mg 1 tab  twice a day    Family/ staff Communication: Discussed plan of care with resident.  Labs/tests ordered: KUB and urinalysis with culture and sensitivity  Goals of care:   Long-term care   Durenda Age, DNP, FNP-BC [facility] [phone] (Monday-Friday 8:00 a.m. - 5:00 p.m.) [phone] (after hours)

## 2018-07-11 LAB — URINE CULTURE

## 2018-07-25 ENCOUNTER — Telehealth: Payer: Self-pay | Admitting: *Deleted

## 2018-07-25 NOTE — Telephone Encounter (Signed)
Mrs Rosene daughter called and she is still having issues.  She needs advice for her spinal cord injury.  She is in an assisted living.  She has not been seen since January and was to return in 2 months but has not.  Please advise.

## 2018-07-26 NOTE — Telephone Encounter (Signed)
The issue is with her bowel program and issues she is having at the SNF.  She has been to the ED twice with bloating and small impaction. Her daughter would like to speak with you about what is being done now at [facility] and feels like the MD is limited in SCI issues.  Her number is [phone]

## 2018-07-26 NOTE — Telephone Encounter (Signed)
If there are multiple issues, it would be best that she come in person to clinic. If it is something straight forward and singular, then perhaps we could work through on phone. If she comes in and I don't have a spot for her, then Dr. Posey Pronto could see her if willing.Marland KitchenMarland KitchenMarland Kitchen

## 2018-08-01 ENCOUNTER — Other Ambulatory Visit: Payer: Self-pay | Admitting: Internal Medicine

## 2018-08-01 DIAGNOSIS — R140 Abdominal distension (gaseous): Secondary | ICD-10-CM

## 2018-08-02 ENCOUNTER — Encounter
Admission: RE | Admit: 2018-08-02 | Discharge: 2018-08-02 | Disposition: A | Discharge status: Home / Self Care | Payer: Medicare HMO | Source: Ambulatory Visit | Attending: Internal Medicine | Admitting: Internal Medicine

## 2018-08-08 ENCOUNTER — Other Ambulatory Visit: Payer: Self-pay

## 2018-08-08 ENCOUNTER — Ambulatory Visit (INDEPENDENT_AMBULATORY_CARE_PROVIDER_SITE_OTHER): Payer: Medicare HMO | Admitting: Gastroenterology

## 2018-08-08 ENCOUNTER — Encounter: Payer: Self-pay | Admitting: Gastroenterology

## 2018-08-08 VITALS — BP 139/80 | HR 90 | Temp 97.6°F

## 2018-08-08 DIAGNOSIS — K5900 Constipation, unspecified: Secondary | ICD-10-CM

## 2018-08-08 NOTE — Progress Notes (Signed)
[name] MD, MRCP(U.K) [address]  [place]  [place], [place] [address]  Main: [phone]  Fax: [phone]   Gastroenterology Consultation  Referring Provider:     Kirk Ruths, MD Primary Care Physician:  Glendon Axe, MD Primary Gastroenterologist:  Dr. [name]  Reason for Consultation:     Abdominal pain and distension         HPI:   Sherry [name] is a 84 y.o. y/o female referred for consultation & management  by Dr. Glendon Axe, MD.    She has been referred for abdominal distension and abdominal pain. H/o central cord synrome C 6 level, on lactulose and senna for constipation.   Ct abdomen 06/2018 L Large colorectal stool burden with fecal impaction. Periappendiceal phlegmon decreased in size. Bladder wall thickening . Cystitis not excluded. H/o acute appendicitis . UA on 07/10/2018 shows leucocyts and positive for nitrites and bacteria. Multiple bacteria seen on culture.   All began end of April - developed appendicitis. Treated with IV antibiotics . Felt by Dr Peyton Najjar that the appendicitis was caused by the constipation. She says that the constipation for a while - when she moved to a different place the bowel regime was changed and then things changed.   Presently she has an enema twice a day - on dulcolax, senna. Stool presently not hard. No blood. Feels better after a bowel movement . The abdominal pain has resolved. The main issue is distension. She has never had a colonoscopy . Weight loss + from fluid loss she states. Cervical cord injury in 11/2017 from a fall.    Past Medical History:  Diagnosis Date  . Acute blood loss anemia   . Arthritis   . Cancer ([place])    skin  . Central cord syndrome at C6 level of cervical spinal cord ([place]) 11/29/2017  . Hypertension   . Protein-calorie malnutrition, severe ([place]) 01/24/2018  . S/P insertion of IVC (inferior vena caval) filter 01/24/2018  . 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[facility])     Past Surgical History:   Procedure Laterality Date  . ANTERIOR CERVICAL DECOMP/DISCECTOMY FUSION N/A 11/29/2017   Procedure: Cervical five-six, six-seven Anterior Cervical Decompression Fusion;  Surgeon: Judith Part, MD;  Location: [facility];  Service: Neurosurgery;  Laterality: N/A;  Cervical five-six, six-seven Anterior Cervical Decompression Fusion  . CATARACT EXTRACTION    . IR IVC FILTER PLMT / S&I /IMG GUID/MOD SED  12/08/2017    Prior to Admission medications   Medication Sig Start Date End Date Taking? Authorizing Provider  acetaminophen (TYLENOL) 325 MG tablet Take 650 mg by mouth every 6 (six) hours.  01/20/18   [provider]  Alpha-D-Galactosidase (BEANO) TABS Take 150 mg by mouth 3 (three) times daily with meals as needed (Bloating). Give 2 tabs = 300 mg    [provider]  alum & mag hydroxide-simeth (MAALOX PLUS) 400-400-40 MG/5ML suspension Take 30 mLs by mouth every 4 (four) hours as needed for indigestion.    [provider]  amLODipine (NORVASC) 10 MG tablet Take 10 mg by mouth daily after supper.     [provider]  bisacodyl (DULCOLAX) 10 MG suppository Place 10 mg rectally daily as needed for moderate constipation. 01/24/18   [provider]  calcium carbonate (TUMS EX) 750 MG chewable tablet Chew 1 tablet by mouth 3 (three) times daily. 02/10/18   [provider]  Capsaicin 0.1 % CREA Apply 1 application topically 2 (two) times daily. Apply to left shoulder,  bilateral hands, and knees    [provider]  DULoxetine (CYMBALTA) 60 MG capsule Take 60 mg by mouth daily.  02/01/18   [provider]  gabapentin (NEURONTIN) 400 MG capsule Take 400 mg by mouth every 6 (six) hours. 01/20/18   [provider]  lactulose (CHRONULAC) 10 GM/15ML solution Take 30 g by mouth daily.    [provider]  loratadine (CLARITIN) 10 MG tablet Take 10 mg by mouth daily.  01/19/18   [provider]  magnesium hydroxide  (MILK OF MAGNESIA) 400 MG/5ML suspension Take 30 mLs by mouth every 4 (four) hours as needed for mild constipation.    [provider]  Multiple Vitamin (MULTIVITAMIN WITH MINERALS) TABS tablet Take 1 tablet by mouth daily.    [provider]  NON FORMULARY Diet Type:  Regular - NO BEEF, PORK OR DAIRY PRODUCTS    [provider]  nortriptyline (PAMELOR) 25 MG capsule Take 25 mg by mouth at bedtime. 01/20/18   [provider]  Ostomy Supplies (SKIN PREP WIPES) MISC Apply to both heals daily 01/20/18   [provider]  oxyCODONE (OXY IR/ROXICODONE) 5 MG immediate release tablet Take 0.5 tablets (2.5 mg total) by mouth every 12 hours as needed for severe pain. 03/29/18   Medina-Vargas, Monina C, NP  Probiotic Product (RISA-BID PROBIOTIC) TABS Take 1 tablet by mouth daily.  02/04/18   [provider]  rivaroxaban (XARELTO) 20 MG TABS tablet Take 1 tablet (20 mg total) by mouth daily with supper. 01/18/18   Angiulli, Lavon Paganini, PA-C  sennosides-docusate sodium (SENOKOT-S) 8.6-50 MG tablet Take 2 tablets by mouth 2 (two) times a day.    [provider]  Skin Protectants, Misc. (ENDIT EX) Apply topically to bottom / peri-area after preforming peri-care 01/22/18   [provider]  sodium phosphate (FLEET) 7-19 GM/118ML ENEM Place 1 enema rectally daily as needed for severe constipation.    [provider]  spironolactone (ALDACTONE) 25 MG tablet Take 25 mg by mouth 2 (two) times daily.  03/08/18   [provider]  tizanidine (ZANAFLEX) 2 MG capsule Take 2 mg by mouth every 8 (eight) hours.  03/03/18   [provider]    Family History  Problem Relation Age of Onset  . Breast cancer Maternal Aunt 75     Social History   Tobacco Use  . Smoking status: Never Smoker  . Smokeless tobacco: Never Used  Substance Use Topics  . Alcohol use: Never    Frequency: Never  . Drug use: Never    Allergies as of  08/08/2018 - Review Complete 07/10/2018  Allergen Reaction Noted  . Other Swelling 11/25/2016  . Lisinopril-hydrochlorothiazide Swelling 12/23/2015  . Beef-derived products Swelling 11/28/2017  . Dairy aid [lactase] Swelling 12/04/2017  . Lisinopril-hydrochlorothiazide Swelling 11/28/2017  . Pork-derived products Swelling 12/04/2017    Review of Systems:    All systems reviewed and negative except where noted in HPI.   Physical Exam:  There were no vitals taken for this visit. No LMP recorded. Patient is postmenopausal. Psych:  Alert and cooperative. Normal mood and affect. General:   Alert,  Well-developed, well-nourished, pleasant and cooperative in NAD Head:  Normocephalic and atraumatic. Eyes:  Sclera clear, no icterus.   Conjunctiva pink. Ears:  Normal auditory acuity. Nose:  No deformity, discharge, or lesions. Mouth:  No deformity or lesions,oropharynx pink & moist. Neck:  Supple; no masses or thyromegaly. Lungs:  Respirations even and unlabored.  Clear  throughout to auscultation.   No wheezes, crackles, or rhonchi. No acute distress. Heart:  Regular rate and rhythm; no murmurs, clicks, rubs, or gallops. Abdomen:  Normal bowel sounds.  No bruits.  Soft, non-tender , distended +, without masses, hepatosplenomegaly or hernias noted.  No guarding or rebound tenderness.    Neurologic:  Alert and oriented x3;  In a wheel chair  Skin:  Intact without significant lesions or rashes. No jaundice. Lymph Nodes:  No significant cervical adenopathy. Psych:  Alert and cooperative. Normal mood and affect.  Imaging Studies: No results found.  Assessment and Plan:   Maricella A Bomkamp is a 84 y.o. y/o female has been referred for abdominal pain which has resolved, abdominal distension likely secondary to constipation which in turn probably related to spinal cord injury. Failed lactulose, senna and dulcolax. Discussed briefly with patient and daughter about colonoscopy but I feel would be  hard to perform due to decreased mobility and increased risk at age and h/o spinal cord injury . We agreed to try a new regime.   Plan  1. Cut fleet enemas to once a day till has regular bowel movements then stop 2. Stop docusate, senna, milk of magnesia and lactulose.  3. Commence on Linzess 145 mcg- samples provided after a bottle of magnesium citrate 4. If in 4 days does not work increase dose to 290 mcg a day .  5. F/u next Wednesday virtual visit on zoom .    Follow up in 1 week.   Dr [name] MD,MRCP(U.K)

## 2018-08-16 ENCOUNTER — Other Ambulatory Visit: Payer: Self-pay

## 2018-08-16 ENCOUNTER — Telehealth: Payer: Self-pay | Admitting: Gastroenterology

## 2018-08-16 ENCOUNTER — Ambulatory Visit (INDEPENDENT_AMBULATORY_CARE_PROVIDER_SITE_OTHER): Payer: Medicare HMO | Admitting: Gastroenterology

## 2018-08-16 ENCOUNTER — Ambulatory Visit: Payer: Medicare HMO | Admitting: Gastroenterology

## 2018-08-16 DIAGNOSIS — R140 Abdominal distension (gaseous): Secondary | ICD-10-CM

## 2018-08-16 DIAGNOSIS — K5900 Constipation, unspecified: Secondary | ICD-10-CM

## 2018-08-16 NOTE — Telephone Encounter (Signed)
Patient's daughter([name]) called & states that Dr Vicente Males was going to need to call [facility] facility to schedule the x-ray he wanted to do on patient so they can get the mobile unit out to her. Please call daughter to let her know the status [phone].

## 2018-08-16 NOTE — Telephone Encounter (Signed)
Spoke with Rodman Key at the [facility] assisted living facility to give the order for pt abdominal x-ray. Rodman Key states the mobile x-ray unit should arrive sometime today. I have informed him of our office fax number for the results.  I have notified pt daughter as requested.

## 2018-08-16 NOTE — Progress Notes (Signed)
Sherry [name] , MD [address]  [place]  [place], [place] [address]  Main: [phone]  Fax: [phone]   Primary Care Physician: Glendon Axe, MD  Virtual Visit via Telephone Note  I connected with patient on 08/16/18 at  8:30 AM EDT by telephone and verified that I am speaking with the correct person using two identifiers.   I discussed the limitations, risks, security and privacy concerns of performing an evaluation and management service by telephone and the availability of in person appointments. I also discussed with the patient that there may be a patient responsible charge related to this service. The patient expressed understanding and agreed to proceed.  Location of Patient: Home Location of Provider: Home Persons involved: Patient and provider only   History of Present Illness: Chief Complaint  Patient presents with  . Follow-up    Constipation    HPI: Sherry [name] is a 84 y.o. female   Summary of history :  She was initially referred and seen on 08/08/2018 referred for abdominal distension and abdominal pain. H/o central cord synrome C 6 level, on lactulose and senna for constipation.  All began end of April - developed appendicitis. Treated with IV antibiotics . Felt by Dr Peyton Najjar that the appendicitis was caused by the constipation. She says that the constipation for a while - when she moved to a different place the bowel regime was changed and then things changed.   Ct abdomen 06/2018 L Large colorectal stool burden with fecal impaction. Periappendiceal phlegmon decreased in size. Bladder wall thickening . Cystitis not excluded. H/o acute appendicitis . UA on 07/10/2018 shows leucocyts and positive for nitrites and bacteria. Multiple bacteria seen on culture. Cervical cord injury in 11/2017 from a fall.   Interval history   08/08/2018-  08/16/2018  She has been taking her Linzess 145 mcg every day and she says that she has been having a "result".   She also has been using enemas.  Her main complaint today was bloating. She has never had a colonoscopy . Weight loss + from fluid loss she states.   Current Outpatient Medications  Medication Sig Dispense Refill  . acetaminophen (TYLENOL) 325 MG tablet Take 650 mg by mouth every 6 (six) hours.     . Alpha-D-Galactosidase (BEANO) TABS Take 150 mg by mouth 3 (three) times daily with meals as needed (Bloating). Give 2 tabs = 300 mg    . alum & mag hydroxide-simeth (MAALOX PLUS) 400-400-40 MG/5ML suspension Take 30 mLs by mouth every 4 (four) hours as needed for indigestion.    Marland Kitchen amLODipine (NORVASC) 10 MG tablet Take 10 mg by mouth daily after supper.     . calcium carbonate (TUMS EX) 750 MG chewable tablet Chew 1 tablet by mouth 3 (three) times daily.    . Capsaicin 0.1 % CREA Apply 1 application topically 2 (two) times daily. Apply to left shoulder, bilateral hands, and knees    . DULoxetine (CYMBALTA) 60 MG capsule Take 60 mg by mouth daily.     Marland Kitchen gabapentin (NEURONTIN) 400 MG capsule Take 400 mg by mouth every 6 (six) hours.    Marland Kitchen loratadine (CLARITIN) 10 MG tablet Take 10 mg by mouth daily.     . magnesium hydroxide (MILK OF MAGNESIA) 400 MG/5ML suspension Take 30 mLs by mouth every 4 (four) hours as needed for mild constipation.    . Multiple Vitamin (MULTIVITAMIN WITH MINERALS) TABS tablet Take 1 tablet by mouth daily.    Marland Kitchen  NON FORMULARY Diet Type:  Regular - NO BEEF, PORK OR DAIRY PRODUCTS    . nortriptyline (PAMELOR) 25 MG capsule Take 25 mg by mouth at bedtime.    Oneta Rack Supplies (SKIN PREP WIPES) MISC Apply to both heals daily    . oxyCODONE (OXY IR/ROXICODONE) 5 MG immediate release tablet Take 0.5 tablets (2.5 mg total) by mouth every 12 hours as needed for severe pain. 14 tablet 0  . Probiotic Product (RISA-BID PROBIOTIC) TABS Take 1 tablet by mouth daily.     . rivaroxaban (XARELTO) 20 MG TABS tablet Take 1 tablet (20 mg total) by mouth daily with supper. 30 tablet   . Skin  Protectants, Misc. (ENDIT EX) Apply topically to bottom / peri-area after preforming peri-care    . sodium phosphate (FLEET) 7-19 GM/118ML ENEM Place 1 enema rectally daily as needed for severe constipation.    Marland Kitchen spironolactone (ALDACTONE) 25 MG tablet Take 25 mg by mouth 2 (two) times daily.     . tizanidine (ZANAFLEX) 2 MG capsule Take 2 mg by mouth every 8 (eight) hours.     . bisacodyl (DULCOLAX) 10 MG suppository Place 10 mg rectally daily as needed for moderate constipation.    Marland Kitchen lactulose (CHRONULAC) 10 GM/15ML solution Take 30 g by mouth daily.    . sennosides-docusate sodium (SENOKOT-S) 8.6-50 MG tablet Take 2 tablets by mouth 2 (two) times a day.     No current facility-administered medications for this visit.     Allergies as of 08/16/2018 - Review Complete 08/16/2018  Allergen Reaction Noted  . Other Swelling 11/25/2016  . Lisinopril-hydrochlorothiazide Swelling 12/23/2015  . Beef-derived products Swelling 11/28/2017  . Dairy aid [lactase] Swelling 12/04/2017  . Lisinopril-hydrochlorothiazide Swelling 11/28/2017  . Pork-derived products Swelling 12/04/2017    Review of Systems:    All systems reviewed and negative except where noted in HPI.   Observations/Objective:  Labs: CMP     Component Value Date/Time   NA 137 06/14/2018 1343   K 4.2 06/14/2018 1343   CL 99 06/14/2018 1343   CO2 28 06/14/2018 1343   GLUCOSE 99 06/14/2018 1343   BUN 11 06/14/2018 1343   CREATININE 0.50 06/14/2018 1631   CALCIUM 9.0 06/14/2018 1343   PROT 6.3 (L) 06/14/2018 1343   ALBUMIN 3.1 (L) 06/14/2018 1343   AST 38 06/14/2018 1343   ALT 27 06/14/2018 1343   ALKPHOS 84 06/14/2018 1343   BILITOT 0.2 (L) 06/14/2018 1343   GFRNONAA >60 06/14/2018 1343   GFRAA >60 06/14/2018 1343   Lab Results  Component Value Date   WBC 7.2 06/14/2018   HGB 12.0 06/14/2018   HCT 36.8 06/14/2018   MCV 87.2 06/14/2018   PLT 554 (H) 06/14/2018    Imaging Studies: No results found.  Assessment  and Plan:   Sherry [name] is a 84 y.o. y/o female here today to follow-up for constipation which in turn probably related to spinal cord injury. Failed lactulose, senna and dulcolax. Discussed briefly with patient and daughter about colonoscopy but I feel would be hard to perform due to decreased mobility and increased risk at age and h/o spinal cord injury . We agreed to try a new regime.   Plan  1.  Obtain x-ray of the abdomen today to due to mine if the bloating is due to gas or rather from constipation.  If from gas she has been advised to start on charcoal tablets 3 times a day.  If not from gas and  if it is either from constipation then we would have to increase the dose of Linzess. 2.  I will call the patient after I get the x-ray result in a day or 2.  This was a conference call between the patient and her daughter.     I discussed the assessment and treatment plan with the patient. The patient was provided an opportunity to ask questions and all were answered. The patient agreed with the plan and demonstrated an understanding of the instructions.   The patient was advised to call back or seek an in-person evaluation if the symptoms worsen or if the condition fails to improve as anticipated.  I provided 12 minutes of non-face-to-face time during this encounter.  Dr Sherry Bellows MD,MRCP [facility] Gastroenterology/Hepatology Pager: [phone]   Speech recognition software was used to dictate this note.

## 2018-08-16 NOTE — Telephone Encounter (Signed)
The # to [facility] place [phone].

## 2018-08-17 ENCOUNTER — Encounter: Payer: Self-pay | Admitting: Gastroenterology

## 2018-08-17 ENCOUNTER — Ambulatory Visit: Payer: Medicare HMO | Admitting: Gastroenterology

## 2018-08-18 ENCOUNTER — Ambulatory Visit: Payer: Medicare HMO | Admitting: Gastroenterology

## 2018-08-22 ENCOUNTER — Telehealth: Payer: Self-pay | Admitting: Gastroenterology

## 2018-08-22 NOTE — Telephone Encounter (Signed)
Pt daughter is calling regarding pt she states Dr. Vicente Males has increased her rx Linzess and she was  Told if she had  bloading/pain she could start Charcole tablets she would like to get them Dr. Georgeann Oppenheim nurse just needs to [facility] where pt [facility] please call [phone]

## 2018-08-22 NOTE — Telephone Encounter (Signed)
1. Is the higher dose linzess working? 2. Charcoal tablets 1 taken twice daily

## 2018-08-23 NOTE — Telephone Encounter (Signed)
Yes X-ray before follow up

## 2018-08-24 ENCOUNTER — Ambulatory Visit: Payer: Medicare HMO | Admitting: Gastroenterology

## 2018-08-24 NOTE — Telephone Encounter (Signed)
Pt daughter left vm  Regarding pt stating pt is still uncomfortable and bloated, she still feels as something was inside of her  She would like to do another round of Magnesium Citrate please call pt daughter

## 2018-08-25 NOTE — Telephone Encounter (Signed)
[place] for another round of magnesium citrate

## 2018-08-28 NOTE — Telephone Encounter (Signed)
Called in the order for the repeat abdominal x-ray and additional dose of the magnesium citrate to pt assisted living facility.

## 2018-08-28 NOTE — Telephone Encounter (Signed)
Called in the order for additional dose of magnesium citrate to pt assisted living facility. Also called pt daughter and informed her in a detailed VM.

## 2018-08-30 ENCOUNTER — Ambulatory Visit (INDEPENDENT_AMBULATORY_CARE_PROVIDER_SITE_OTHER): Payer: Medicare HMO | Admitting: Gastroenterology

## 2018-08-30 DIAGNOSIS — K5900 Constipation, unspecified: Secondary | ICD-10-CM | POA: Diagnosis not present

## 2018-08-30 DIAGNOSIS — R140 Abdominal distension (gaseous): Secondary | ICD-10-CM | POA: Diagnosis not present

## 2018-08-30 NOTE — Progress Notes (Signed)
[name] , MD [address]  [place]  [place], [place] [address]  Main: [phone]  Fax: [phone]   Primary Care Physician: Glendon Axe, MD  Virtual Visit via Telephone Note  I connected with patient on 08/30/18 at  9:00 AM EDT by telephone and verified that I am speaking with the correct person using two identifiers.   I discussed the limitations, risks, security and privacy concerns of performing an evaluation and management service by telephone and the availability of in person appointments. I also discussed with the patient that there may be a patient responsible charge related to this service. The patient expressed understanding and agreed to proceed.  Location of Patient: Home Location of Provider: Home Persons involved: Patient and provider only   History of Present Illness: Chief Complaint  Patient presents with  . Follow-up    Constipation    HPI: Sherry [name] is a 84 y.o. female   Summary of history :  She was initially referred and seen on 08/08/2018 referred for abdominal distension and abdominal pain. H/o central cord synrome C 6 level, on lactulose and senna for constipation.  All began end of April - developed appendicitis. Treated with IV antibiotics . Felt by Dr Peyton Najjar that the appendicitis was caused by the constipation. She says that the constipation for a while - when she moved to a different place the bowel regime was changed and then things changed.   Ct abdomen 06/2018 Large colorectal stool burden with fecal impaction. Periappendiceal phlegmon decreased in size. Bladder wall thickening . Cystitis not excluded. H/o acute appendicitis . UA on 07/10/2018 shows leucocyts and positive for nitrites and bacteria. Multiple bacteria seen on culture. Cervical cord injury in 11/2017 from a fall.  Interval history    08/16/2018-08/30/2018  Conference call with patient and daughter, was on l;inzess 145 mcg , helped but was still bloated, got  x ray abdomen which showed stool burden, increased dose of linzess to 290 mcg and with occasional enemas having regular bowel movements with resolution of bloating. No other symptoms.     Current Outpatient Medications  Medication Sig Dispense Refill  . acetaminophen (TYLENOL) 325 MG tablet Take 650 mg by mouth every 6 (six) hours.     . Alpha-D-Galactosidase (BEANO) TABS Take 150 mg by mouth 3 (three) times daily with meals as needed (Bloating). Give 2 tabs = 300 mg    . alum & mag hydroxide-simeth (MAALOX PLUS) 400-400-40 MG/5ML suspension Take 30 mLs by mouth every 4 (four) hours as needed for indigestion.    Marland Kitchen amLODipine (NORVASC) 10 MG tablet Take 10 mg by mouth daily after supper.     . bisacodyl (DULCOLAX) 10 MG suppository Place 10 mg rectally daily as needed for moderate constipation.    . calcium carbonate (TUMS EX) 750 MG chewable tablet Chew 1 tablet by mouth 3 (three) times daily.    . Capsaicin 0.1 % CREA Apply 1 application topically 2 (two) times daily. Apply to left shoulder, bilateral hands, and knees    . DULoxetine (CYMBALTA) 60 MG capsule Take 60 mg by mouth daily.     Marland Kitchen gabapentin (NEURONTIN) 400 MG capsule Take 400 mg by mouth every 6 (six) hours.    Marland Kitchen lactulose (CHRONULAC) 10 GM/15ML solution Take 30 g by mouth daily.    Marland Kitchen loratadine (CLARITIN) 10 MG tablet Take 10 mg by mouth daily.     . magnesium hydroxide (MILK OF MAGNESIA) 400 MG/5ML suspension Take 30  mLs by mouth every 4 (four) hours as needed for mild constipation.    . Multiple Vitamin (MULTIVITAMIN WITH MINERALS) TABS tablet Take 1 tablet by mouth daily.    . NON FORMULARY Diet Type:  Regular - NO BEEF, PORK OR DAIRY PRODUCTS    . nortriptyline (PAMELOR) 25 MG capsule Take 25 mg by mouth at bedtime.    Oneta Rack Supplies (SKIN PREP WIPES) MISC Apply to both heals daily    . oxyCODONE (OXY IR/ROXICODONE) 5 MG immediate release tablet Take 0.5 tablets (2.5 mg total) by mouth every 12 hours as needed for severe  pain. 14 tablet 0  . Probiotic Product (RISA-BID PROBIOTIC) TABS Take 1 tablet by mouth daily.     . rivaroxaban (XARELTO) 20 MG TABS tablet Take 1 tablet (20 mg total) by mouth daily with supper. 30 tablet   . sennosides-docusate sodium (SENOKOT-S) 8.6-50 MG tablet Take 2 tablets by mouth 2 (two) times a day.    . Skin Protectants, Misc. (ENDIT EX) Apply topically to bottom / peri-area after preforming peri-care    . sodium phosphate (FLEET) 7-19 GM/118ML ENEM Place 1 enema rectally daily as needed for severe constipation.    Marland Kitchen spironolactone (ALDACTONE) 25 MG tablet Take 25 mg by mouth 2 (two) times daily.     . tizanidine (ZANAFLEX) 2 MG capsule Take 2 mg by mouth every 8 (eight) hours.      No current facility-administered medications for this visit.     Allergies as of 08/30/2018 - Review Complete 08/30/2018  Allergen Reaction Noted  . Other Swelling 11/25/2016  . Lisinopril-hydrochlorothiazide Swelling 12/23/2015  . Beef-derived products Swelling 11/28/2017  . Dairy aid [lactase] Swelling 12/04/2017  . Lisinopril-hydrochlorothiazide Swelling 11/28/2017  . Pork-derived products Swelling 12/04/2017    Review of Systems:    All systems reviewed and negative except where noted in HPI.   Observations/Objective:  Labs: CMP     Component Value Date/Time   NA 137 06/14/2018 1343   K 4.2 06/14/2018 1343   CL 99 06/14/2018 1343   CO2 28 06/14/2018 1343   GLUCOSE 99 06/14/2018 1343   BUN 11 06/14/2018 1343   CREATININE 0.50 06/14/2018 1631   CALCIUM 9.0 06/14/2018 1343   PROT 6.3 (L) 06/14/2018 1343   ALBUMIN 3.1 (L) 06/14/2018 1343   AST 38 06/14/2018 1343   ALT 27 06/14/2018 1343   ALKPHOS 84 06/14/2018 1343   BILITOT 0.2 (L) 06/14/2018 1343   GFRNONAA >60 06/14/2018 1343   GFRAA >60 06/14/2018 1343   Lab Results  Component Value Date   WBC 7.2 06/14/2018   HGB 12.0 06/14/2018   HCT 36.8 06/14/2018   MCV 87.2 06/14/2018   PLT 554 (H) 06/14/2018    Imaging  Studies: No results found.  Assessment and Plan:   Sherry [name] is a 84 y.o. y/o female here today to follow-up for constipation which in turn probably related to spinal cord injury. Failed lactulose, senna and dulcolax. Discussed briefly with patient and daughter about colonoscopy but I feel would be hard to perform due to decreased mobility and increased risk at age and h/o spinal cord injury . Doing well on linzess 290 mcg a day   Plan  1. continue linzess if fails can either add lactulose or change to trulance.      I discussed the assessment and treatment plan with the patient. The patient was provided an opportunity to ask questions and all were answered. The patient agreed with the  plan and demonstrated an understanding of the instructions.   The patient was advised to call back or seek an in-person evaluation if the symptoms worsen or if the condition fails to improve as anticipated.  I provided 12 minutes of non-face-to-face time during this encounter.  Dr [name] MD,MRCP [facility]) Gastroenterology/Hepatology Pager: [phone]   Speech recognition software was used to dictate this note.

## 2018-08-31 DIAGNOSIS — K5901 Slow transit constipation: Secondary | ICD-10-CM | POA: Insufficient documentation

## 2018-09-12 ENCOUNTER — Ambulatory Visit: Payer: Medicare HMO | Admitting: Gastroenterology

## 2018-09-18 ENCOUNTER — Telehealth: Payer: Self-pay | Admitting: Gastroenterology

## 2018-09-18 ENCOUNTER — Ambulatory Visit: Payer: Medicare HMO | Admitting: Gastroenterology

## 2018-09-18 NOTE — Telephone Encounter (Signed)
[name] called patient's daughter to ask if Dr Vicente Males would call to facility to order mag citrate for patient to get things moving again. The patient is experiencing bloating & fullness.  Please call [phone] for the order to the nurse's station.

## 2018-09-20 NOTE — Telephone Encounter (Signed)
Spoke with pt daughter and informed her that we have called in the order for the Magnesium citrate to pt assisted living facility.

## 2018-09-20 NOTE — Telephone Encounter (Signed)
Pt daughter  Left vm she states she has left a prev. Message regarding pt needing a Magnesium Citrate liquid please call  cb [phone]

## 2018-09-25 ENCOUNTER — Telehealth: Payer: Self-pay | Admitting: Gastroenterology

## 2018-09-25 NOTE — Telephone Encounter (Signed)
Returned call to [place] at [facility] assisted living. Unable to reach anyone, phone continued to ring no VM picked up.

## 2018-09-25 NOTE — Telephone Encounter (Signed)
Sherry [name] from [facility] [place] would like a call from Nurse regarding rx Magnesium Citarte Directions and instructions please call [phone]

## 2018-09-27 ENCOUNTER — Telehealth: Payer: Self-pay | Admitting: Gastroenterology

## 2018-09-27 ENCOUNTER — Ambulatory Visit: Payer: Medicare HMO | Admitting: Gastroenterology

## 2018-09-27 ENCOUNTER — Emergency Department: Payer: Medicare HMO

## 2018-09-27 ENCOUNTER — Emergency Department
Admission: EM | Admit: 2018-09-27 | Discharge: 2018-10-04 | Disposition: A | Discharge status: Home / Self Care | Payer: Medicare HMO | Attending: Student in an Organized Health Care Education/Training Program | Admitting: Student in an Organized Health Care Education/Training Program

## 2018-09-27 ENCOUNTER — Encounter: Payer: Medicare HMO | Admitting: Physical Medicine & Rehabilitation

## 2018-09-27 ENCOUNTER — Other Ambulatory Visit: Payer: Self-pay

## 2018-09-27 DIAGNOSIS — Z7901 Long term (current) use of anticoagulants: Secondary | ICD-10-CM | POA: Diagnosis not present

## 2018-09-27 DIAGNOSIS — R140 Abdominal distension (gaseous): Secondary | ICD-10-CM

## 2018-09-27 DIAGNOSIS — I10 Essential (primary) hypertension: Secondary | ICD-10-CM | POA: Diagnosis not present

## 2018-09-27 DIAGNOSIS — Z79899 Other long term (current) drug therapy: Secondary | ICD-10-CM | POA: Insufficient documentation

## 2018-09-27 DIAGNOSIS — R109 Unspecified abdominal pain: Secondary | ICD-10-CM | POA: Diagnosis present

## 2018-09-27 LAB — COMPREHENSIVE METABOLIC PANEL
ALT: 19 U/L (ref 0–44)
AST: 25 U/L (ref 15–41)
Albumin: 3.5 g/dL (ref 3.5–5.0)
Alkaline Phosphatase: 81 U/L (ref 38–126)
Anion gap: 9 (ref 5–15)
BUN: 6 mg/dL — ABNORMAL LOW (ref 8–23)
CO2: 29 mmol/L (ref 22–32)
Calcium: 9 mg/dL (ref 8.9–10.3)
Chloride: 101 mmol/L (ref 98–111)
Creatinine, Ser: 0.46 mg/dL (ref 0.44–1.00)
GFR calc Af Amer: >60 mL/min (ref >60)
GFR calc non Af Amer: >60 mL/min (ref >60)
Glucose, Bld: 96 mg/dL (ref 70–99)
Potassium: 3.7 mmol/L (ref 3.5–5.1)
Sodium: 139 mmol/L (ref 135–145)
Total Bilirubin: 0.8 mg/dL (ref 0.3–1.2)
Total Protein: 6.3 g/dL — ABNORMAL LOW (ref 6.5–8.1)

## 2018-09-27 LAB — URINALYSIS, COMPLETE (UACMP) WITH MICROSCOPIC
Bilirubin Urine: NEGATIVE
Glucose, UA: NEGATIVE mg/dL
Hgb urine dipstick: NEGATIVE
Ketones, ur: NEGATIVE mg/dL
Nitrite: NEGATIVE
Protein, ur: NEGATIVE mg/dL
Specific Gravity, Urine: 1.009 (ref 1.005–1.030)
pH: 7 (ref 5.0–8.0)

## 2018-09-27 LAB — CBC WITH DIFFERENTIAL/PLATELET
Abs Immature Granulocytes: 0.02 10*3/uL (ref 0.00–0.07)
Basophils Absolute: 0.1 10*3/uL (ref 0.0–0.1)
Basophils Relative: 1 %
Eosinophils Absolute: 0.3 10*3/uL (ref 0.0–0.5)
Eosinophils Relative: 5 %
HCT: 38 % (ref 36.0–46.0)
Hemoglobin: 12.5 g/dL (ref 12.0–15.0)
Immature Granulocytes: 0 %
Lymphocytes Relative: 39 %
Lymphs Abs: 2.6 10*3/uL (ref 0.7–4.0)
MCH: 30.3 pg (ref 26.0–34.0)
MCHC: 32.9 g/dL (ref 30.0–36.0)
MCV: 92.2 fL (ref 80.0–100.0)
Monocytes Absolute: 0.5 10*3/uL (ref 0.1–1.0)
Monocytes Relative: 7 %
Neutro Abs: 3.2 10*3/uL (ref 1.7–7.7)
Neutrophils Relative %: 48 %
Platelets: 392 10*3/uL (ref 150–400)
RBC: 4.12 MIL/uL (ref 3.87–5.11)
RDW: 14.6 % (ref 11.5–15.5)
WBC: 6.6 10*3/uL (ref 4.0–10.5)
nRBC: 0 % (ref 0.0–0.2)

## 2018-09-27 MED ORDER — METOCLOPRAMIDE HCL 5 MG PO TABS: 5.0000 mg | ORAL_TABLET | Freq: Three times a day (TID) | ORAL | 1 refills | Status: DC | PRN

## 2018-09-27 MED ORDER — IOHEXOL 240 MG/ML SOLN: 25.0000 mL | INTRAMUSCULAR | Status: DC

## 2018-09-27 MED ORDER — IOHEXOL 300 MG/ML  SOLN
80.0000 mL | Freq: Once | INTRAMUSCULAR | Status: AC | PRN
  Administered 2018-09-27: 80 mL via INTRAVENOUS

## 2018-09-27 MED ORDER — IOHEXOL 240 MG/ML SOLN
25.0000 mL | Freq: Once | INTRAMUSCULAR | Status: AC | PRN
  Administered 2018-09-27: 08:00:00 25 mL via ORAL

## 2018-09-27 MED ORDER — SODIUM CHLORIDE 0.9 % IV BOLUS
500.0000 mL | Freq: Once | INTRAVENOUS | Status: AC
  Administered 2018-09-27: 08:00:00 500 mL via INTRAVENOUS

## 2018-09-27 NOTE — ED Notes (Signed)
Report given to [place], [place]

## 2018-09-27 NOTE — ED Notes (Signed)
Pt to go back to RM 343 at [facility]

## 2018-09-27 NOTE — Discharge Instructions (Signed)
You have been seen in the emergency department for emergency care. It is important that you contact your own doctor, specialist or the closest clinic for follow-up care. Please bring this instruction sheet, all medications and X-ray copies with you when you are seen for follow-up care. ° °Determining the exact cause for all patients with abdominal pain is extremely difficult in the emergency department. Our primary focus is to rule-out immediate life-threatening diseases. If no immediate source of pain is found the definitive diagnosis frequently needs to be determined over time.Many times your primary care physician can determine the cause by following the symptoms over time. Sometimes, specialist are required such as Gastroenterologists, Gynecologists, Urologists or Surgeons. Please return °immediately to the Emergency Department for fever>101, Vomiting or Intractable Pain. You should return to the emergency department or see your primary care provider in 12-24hrs if your pain is no better and °sooner if your pain becomes worse. ° °

## 2018-09-27 NOTE — ED Notes (Addendum)
RN to bedside to discharge pt. Per family and pt pt is unable to be transported home via private vehicle. Pt living at [facility] at [place] and nonambulatory. Pt does not have electric wheelchair in ED and family unable to transport without further lifting assistance. RN called [facility] and was told there was no transportation for the patient and she would have to be sent back via ambulance.

## 2018-09-27 NOTE — Telephone Encounter (Signed)
Pt daughter left vm stating pt has apt this morning but need to cancel pt was admitted this morning conditions seemed to be getting worse. She wanted to let Dr. Vicente Males know this

## 2018-09-27 NOTE — ED Provider Notes (Signed)
[facility] Emergency Department Provider Note    First MD Initiated Contact with Patient 09/27/18 [phone]     (approximate)  I have reviewed the triage vital signs and the nursing notes.   HISTORY  Chief Complaint Abdominal Pain    HPI Sherry [name] is a 84 y.o. female plosive past medical history as well as a history of central cord syndrome and C6 presents to the ER for evaluation of abdominal distention and crampy abdominal pain over the past 2 weeks.  Has had a long history of constipation since her spinal cord injury.  Denies any fevers.  Had similar presentation 6 months ago and was found to have appendicitis.  She not have any fevers.  Does not have any pain at this moment.  She is passing gas but states it has decreased and she feels distended.    Past Medical History:  Diagnosis Date  . Acute blood loss anemia   . Arthritis   . Cancer ([place])    skin  . Central cord syndrome at C6 level of cervical spinal cord ([place]) 11/29/2017  . Hypertension   . Protein-calorie malnutrition, severe ([place]) 01/24/2018  . S/P insertion of IVC (inferior vena caval) filter 01/24/2018  . 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([place])    Family History  Problem Relation Age of Onset  . Breast cancer Maternal Aunt 75   Past Surgical History:  Procedure Laterality Date  . ANTERIOR CERVICAL DECOMP/DISCECTOMY FUSION N/A 11/29/2017   Procedure: Cervical five-six, six-seven Anterior Cervical Decompression Fusion;  Surgeon: Judith Part, MD;  Location: [facility];  Service: Neurosurgery;  Laterality: N/A;  Cervical five-six, six-seven Anterior Cervical Decompression Fusion  . CATARACT EXTRACTION    . IR IVC FILTER PLMT / S&I /IMG GUID/MOD SED  12/08/2017   Patient Active Problem List   Diagnosis Date Noted  . Acute appendicitis with appendiceal abscess 05/30/2018  . Hypocalcemia 02/15/2018  . Dysuria 02/15/2018  . Bilateral lower extremity edema 02/11/2018  . Vaginal yeast infection  01/30/2018  . Chest tightness 01/27/2018  . Healthcare-associated pneumonia 01/25/2018  . Pleural effusion, not elsewhere classified 01/25/2018  . Acute deep vein thrombosis (DVT) of left lower extremity ([place]) 01/24/2018  . Acute deep vein thrombosis (DVT) of right lower extremity ([place]) 01/24/2018  . Chronic allergic rhinitis 01/24/2018  . Depression with anxiety 01/24/2018  . UTI due to Klebsiella species 01/24/2018  . Protein-calorie malnutrition, severe ([place]) 01/24/2018  . S/P insertion of IVC (inferior vena caval) filter 01/24/2018  . Reactive depression   . Hypokalemia   . Leukocytosis   . Essential hypertension   . Trauma   . 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([place])   . Neuropathic pain   . Neurogenic bowel   . Neurogenic bladder   . Benign essential HTN   . Acute blood loss anemia   . Central cord syndrome at C6 level of cervical spinal cord ([place]) 11/29/2017  . Central cord syndrome ([place]) 11/29/2017      Prior to Admission medications   Medication Sig Start Date End Date Taking? Authorizing Provider  acetaminophen (TYLENOL) 325 MG tablet Take 650 mg by mouth every 6 (six) hours.  01/20/18   [provider]  Alpha-D-Galactosidase (BEANO) TABS Take 150 mg by mouth 3 (three) times daily with meals as needed (Bloating). Give 2 tabs = 300 mg    [provider]  alum & mag hydroxide-simeth (MAALOX PLUS) 400-400-40 MG/5ML suspension Take 30 mLs by mouth every 4 (four) hours as needed for indigestion.  [provider]  amLODipine (NORVASC) 10 MG tablet Take 10 mg by mouth daily after supper.     [provider]  bisacodyl (DULCOLAX) 10 MG suppository Place 10 mg rectally daily as needed for moderate constipation. 01/24/18   [provider]  calcium carbonate (TUMS EX) 750 MG chewable tablet Chew 1 tablet by mouth 3 (three) times daily. 02/10/18   [provider]  Capsaicin 0.1 % CREA Apply 1 application topically 2 (two) times daily. Apply to left  shoulder, bilateral hands, and knees    [provider]  DULoxetine (CYMBALTA) 60 MG capsule Take 60 mg by mouth daily.  02/01/18   [provider]  gabapentin (NEURONTIN) 400 MG capsule Take 400 mg by mouth every 6 (six) hours. 01/20/18   [provider]  lactulose (CHRONULAC) 10 GM/15ML solution Take 30 g by mouth daily.    [provider]  loratadine (CLARITIN) 10 MG tablet Take 10 mg by mouth daily.  01/19/18   [provider]  magnesium hydroxide (MILK OF MAGNESIA) 400 MG/5ML suspension Take 30 mLs by mouth every 4 (four) hours as needed for mild constipation.    [provider]  metoCLOPramide (REGLAN) 5 MG tablet Take 1 tablet (5 mg total) by mouth every 8 (eight) hours as needed for nausea. 09/27/18 09/27/19  Merlyn Lot, MD  Multiple Vitamin (MULTIVITAMIN WITH MINERALS) TABS tablet Take 1 tablet by mouth daily.    [provider]  NON FORMULARY Diet Type:  Regular - NO BEEF, PORK OR DAIRY PRODUCTS    [provider]  nortriptyline (PAMELOR) 25 MG capsule Take 25 mg by mouth at bedtime. 01/20/18   [provider]  Ostomy Supplies (SKIN PREP WIPES) MISC Apply to both heals daily 01/20/18   [provider]  oxyCODONE (OXY IR/ROXICODONE) 5 MG immediate release tablet Take 0.5 tablets (2.5 mg total) by mouth every 12 hours as needed for severe pain. 03/29/18   Medina-Vargas, Monina C, NP  Probiotic Product (RISA-BID PROBIOTIC) TABS Take 1 tablet by mouth daily.  02/04/18   [provider]  rivaroxaban (XARELTO) 20 MG TABS tablet Take 1 tablet (20 mg total) by mouth daily with supper. 01/18/18   Angiulli, Lavon Paganini, PA-C  sennosides-docusate sodium (SENOKOT-S) 8.6-50 MG tablet Take 2 tablets by mouth 2 (two) times a day.    [provider]  Skin Protectants, Misc. (ENDIT EX) Apply topically to bottom / peri-area after preforming peri-care 01/22/18   [provider]  sodium phosphate  (FLEET) 7-19 GM/118ML ENEM Place 1 enema rectally daily as needed for severe constipation.    [provider]  spironolactone (ALDACTONE) 25 MG tablet Take 25 mg by mouth 2 (two) times daily.  03/08/18   [provider]  tizanidine (ZANAFLEX) 2 MG capsule Take 2 mg by mouth every 8 (eight) hours.  03/03/18   [provider]    Allergies Other, Lisinopril-hydrochlorothiazide, Beef-derived products, Dairy aid [lactase], Lisinopril-hydrochlorothiazide, and Pork-derived products    Social History Social History   Tobacco Use  . Smoking status: Never Smoker  . Smokeless tobacco: Never Used  Substance Use Topics  . Alcohol use: Never    Frequency: Never  . Drug use: Never    Review of Systems Patient denies headaches, rhinorrhea, blurry vision, numbness, shortness of breath, chest pain, edema, cough, abdominal pain, nausea, vomiting, diarrhea, dysuria, fevers, rashes or hallucinations unless otherwise stated above in HPI. ____________________________________________   PHYSICAL EXAM:  VITAL SIGNS: Vitals:  09/27/18 1130 09/27/18 1200  BP: 129/73 131/72  Pulse: 84 85  Resp: 16 16  Temp:    SpO2: 97% 97%    Constitutional: Alert and oriented.  Eyes: Conjunctivae are normal.  Head: Atraumatic. Nose: No congestion/rhinnorhea. Mouth/Throat: Mucous membranes are moist.   Neck: No stridor. Painless ROM.  Cardiovascular: Normal rate, regular rhythm. Grossly normal heart sounds.  Good peripheral circulation. Respiratory: Normal respiratory effort.  No retractions. Lungs CTAB. Gastrointestinal: Soft and nontender. No distention. No abdominal bruits. No CVA tenderness. Genitourinary:  Musculoskeletal: No lower extremity tenderness nor edema.  No joint effusions. Neurologic:  Normal speech and language. No gross focal neurologic deficits are appreciated. No facial droop Skin:  Skin is warm, dry and intact. No rash noted. Psychiatric: Mood and affect are  normal. Speech and behavior are normal.  ____________________________________________   LABS (all labs ordered are listed, but only abnormal results are displayed)  Results for orders placed or performed during the hospital encounter of 09/27/18 (from the past 24 hour(s))  CBC with Differential/Platelet     Status: None   Collection Time: 09/27/18  6:41 AM  Result Value Ref Range   WBC 6.6 4.0 - 10.5 K/uL   RBC 4.12 3.87 - 5.11 MIL/uL   Hemoglobin 12.5 12.0 - 15.0 g/dL   HCT 38.0 36.0 - 46.0 %   MCV 92.2 80.0 - 100.0 fL   MCH 30.3 26.0 - 34.0 pg   MCHC 32.9 30.0 - 36.0 g/dL   RDW 14.6 11.5 - 15.5 %   Platelets 392 150 - 400 K/uL   nRBC 0.0 0.0 - 0.2 %   Neutrophils Relative % 48 %   Neutro Abs 3.2 1.7 - 7.7 K/uL   Lymphocytes Relative 39 %   Lymphs Abs 2.6 0.7 - 4.0 K/uL   Monocytes Relative 7 %   Monocytes Absolute 0.5 0.1 - 1.0 K/uL   Eosinophils Relative 5 %   Eosinophils Absolute 0.3 0.0 - 0.5 K/uL   Basophils Relative 1 %   Basophils Absolute 0.1 0.0 - 0.1 K/uL   Immature Granulocytes 0 %   Abs Immature Granulocytes 0.02 0.00 - 0.07 K/uL  Comprehensive metabolic panel     Status: Abnormal   Collection Time: 09/27/18  6:41 AM  Result Value Ref Range   Sodium 139 135 - 145 mmol/L   Potassium 3.7 3.5 - 5.1 mmol/L   Chloride 101 98 - 111 mmol/L   CO2 29 22 - 32 mmol/L   Glucose, Bld 96 70 - 99 mg/dL   BUN 6 (L) 8 - 23 mg/dL   Creatinine, Ser 0.46 0.44 - 1.00 mg/dL   Calcium 9.0 8.9 - 10.3 mg/dL   Total Protein 6.3 (L) 6.5 - 8.1 g/dL   Albumin 3.5 3.5 - 5.0 g/dL   AST 25 15 - 41 U/L   ALT 19 0 - 44 U/L   Alkaline Phosphatase 81 38 - 126 U/L   Total Bilirubin 0.8 0.3 - 1.2 mg/dL   GFR calc non Af Amer >60 >60 mL/min   GFR calc Af Amer >60 >60 mL/min   Anion gap 9 5 - 15  Urinalysis, Complete w Microscopic     Status: Abnormal   Collection Time: 09/27/18 10:13 AM  Result Value Ref Range   Color, Urine YELLOW (A) YELLOW   APPearance CLEAR (A) CLEAR   Specific  Gravity, Urine 1.009 1.005 - 1.030   pH 7.0 5.0 - 8.0   Glucose, UA NEGATIVE NEGATIVE mg/dL  Hgb urine dipstick NEGATIVE NEGATIVE   Bilirubin Urine NEGATIVE NEGATIVE   Ketones, ur NEGATIVE NEGATIVE mg/dL   Protein, ur NEGATIVE NEGATIVE mg/dL   Nitrite NEGATIVE NEGATIVE   Leukocytes,Ua LARGE (A) NEGATIVE   RBC / HPF 0-5 0 - 5 RBC/hpf   WBC, UA 21-50 0 - 5 WBC/hpf   Bacteria, UA FEW (A) NONE SEEN   Squamous Epithelial / LPF 0-5 0 - 5   Amorphous Crystal PRESENT    ____________________________________________  EKG My review and personal interpretation at Time: 6:36   Indication: abn pain  Rate: 105  Rhythm: coarse afib vs sinus with artifact Axis: normal  Other: nonspecific st abn, no stemi ____________________________________________  RADIOLOGY  I personally reviewed all radiographic images ordered to evaluate for the above acute complaints and reviewed radiology reports and findings.  These findings were personally discussed with the patient.  Please see medical record for radiology report.  ____________________________________________   PROCEDURES  Procedure(s) performed:  Procedures    Critical Care performed: no ____________________________________________   INITIAL IMPRESSION / ASSESSMENT AND PLAN / ED COURSE  Pertinent labs & imaging results that were available during my care of the patient were reviewed by me and considered in my medical decision making (see chart for details).   DDX: sbo, enteritis, appendicitis, constipation, electrolyte abn, dehydration, impaction  Sherry [name] is a 84 y.o. who presents to the ED with symptoms as described above.  Patient nontoxic-appearing.  CT imaging was ordered to evaluate for evidence of obstructive pattern and she is complaining of bloating.  Her abdominal exam is soft and benign.  CT imaging does not show any evidence of acute intra-abdominal process.  Blood work is reassuring.  Has trace leukocytes but few  bacteria.  Given lack of fever and with chronic indwelling Foley and urinalysis is actually improved as compared to previous will send for urine culture.     The patient was evaluated in Emergency Department today for the symptoms described in the history of present illness. He/she was evaluated in the context of the global COVID-19 pandemic, which necessitated consideration that the patient might be at risk for infection with the SARS-CoV-2 virus that causes COVID-19. Institutional protocols and algorithms that pertain to the evaluation of patients at risk for COVID-19 are in a state of rapid change based on information released by regulatory bodies including the CDC and federal and state organizations. These policies and algorithms were followed during the patient's care in the ED.  As part of my medical decision making, I reviewed the following data within the [place] notes reviewed and incorporated, Labs reviewed, notes from prior ED visits and [place] Controlled Substance Database   ____________________________________________   FINAL CLINICAL IMPRESSION(S) / ED DIAGNOSES  Final diagnoses:  Abdominal bloating      NEW MEDICATIONS STARTED DURING THIS VISIT:  New Prescriptions   METOCLOPRAMIDE (REGLAN) 5 MG TABLET    Take 1 tablet (5 mg total) by mouth every 8 (eight) hours as needed for nausea.     Note:  This document was prepared using Dragon voice recognition software and may include unintentional dictation errors.    Merlyn Lot, MD 09/27/18 1250

## 2018-09-27 NOTE — ED Notes (Signed)
Pt updated. EMS service pending at this time. Caregiver at bedside,.

## 2018-09-27 NOTE — Telephone Encounter (Signed)
I see that she is in the ER today.  Follow-up visit either in person or on video in a few days time or early next week

## 2018-09-27 NOTE — ED Notes (Signed)
Report given to Ariel.

## 2018-09-27 NOTE — ED Notes (Signed)
Patient transported to CT 

## 2018-09-27 NOTE — ED Triage Notes (Signed)
Patient has not been able to pass gas since this morning. Distension in her belly has built back up since then.

## 2018-09-28 ENCOUNTER — Ambulatory Visit (INDEPENDENT_AMBULATORY_CARE_PROVIDER_SITE_OTHER): Payer: Medicare HMO | Admitting: Gastroenterology

## 2018-09-28 ENCOUNTER — Ambulatory Visit: Payer: Medicare HMO | Admitting: Gastroenterology

## 2018-09-28 DIAGNOSIS — R140 Abdominal distension (gaseous): Secondary | ICD-10-CM | POA: Diagnosis not present

## 2018-09-28 LAB — URINE CULTURE

## 2018-09-28 NOTE — Progress Notes (Signed)
Sherry [name] , MD [address]  [place]  [place], [place] [address]  Main: [phone]  Fax: [phone]   Primary Care Physician: Glendon Axe, MD  Virtual Visit via Telephone Note  I connected with patient on 09/28/18 at  1:15 PM EDT by telephone and verified that I am speaking with the correct person using two identifiers.   I discussed the limitations, risks, security and privacy concerns of performing an evaluation and management service by telephone and the availability of in person appointments. I also discussed with the patient that there may be a patient responsible charge related to this service. The patient expressed understanding and agreed to proceed.  Location of Patient: Home Location of Provider: Home Persons involved: Patient and provider only   History of Present Illness: ED follow-up  HPI: Sherry [name] is a 84 y.o. female   Summary of history :  Shewas initially referred and seen on 7/7/2020referred for abdominal distension and abdominal pain. H/o central cord synrome C 6 level, on lactulose and senna for constipation.  All began end of April - developed appendicitis. Treated with IV antibiotics . Felt by Dr Peyton Najjar that the appendicitis was caused by the constipation. She says that the constipation ongoing for a while - when she moved to a different place the bowel regime was changed and then things changed.   Ct abdomen 06/2018 Large colorectal stool burden with fecal impaction. Periappendiceal phlegmon decreased in size. Bladder wall thickening . Cystitis not excluded. H/o acute appendicitis . UA on 07/10/2018 shows leucocyts and positive for nitrites and bacteria. Multiple bacteria seen on culture. Cervical cord injury in 11/2017 from a fall.  Interval history7/29/2020-09/28/2018  Previously commenced on Linzess 145 mcg which helped but not completely and an x-ray of the abdomen still showed presence of stool and subsequently  increased to to 90 mcg of Linzess.   She went to the ED on 09/27/2018 with abdominal distention and crampy pain.  She felt distended.  CT scan of the abdomen showed no evidence of bowel obstruction or inflammation.  Subsequently discharged. Spoke to patient and daughter.  Patient is feeling better today with regards to the bloating.  Overall it was felt that bloating may have been a side effect from the Linzess and other laxative she has been taking which includes enemas and magnesium citrate.  Current Outpatient Medications  Medication Sig Dispense Refill   acetaminophen (TYLENOL) 325 MG tablet Take 650 mg by mouth every 6 (six) hours.      Alpha-D-Galactosidase (BEANO) TABS Take 150 mg by mouth 3 (three) times daily with meals as needed (Bloating). Give 2 tabs = 300 mg     alum & mag hydroxide-simeth (MAALOX PLUS) 400-400-40 MG/5ML suspension Take 30 mLs by mouth every 4 (four) hours as needed for indigestion.     amLODipine (NORVASC) 10 MG tablet Take 10 mg by mouth daily after supper.      bisacodyl (DULCOLAX) 10 MG suppository Place 10 mg rectally daily as needed for moderate constipation.     calcium carbonate (TUMS EX) 750 MG chewable tablet Chew 1 tablet by mouth 3 (three) times daily.     Capsaicin 0.1 % CREA Apply 1 application topically 2 (two) times daily. Apply to left shoulder, bilateral hands, and knees     DULoxetine (CYMBALTA) 60 MG capsule Take 60 mg by mouth daily.      gabapentin (NEURONTIN) 400 MG capsule Take 400 mg by mouth every 6 (six) hours.  lactulose (CHRONULAC) 10 GM/15ML solution Take 30 g by mouth daily.     loratadine (CLARITIN) 10 MG tablet Take 10 mg by mouth daily.      magnesium hydroxide (MILK OF MAGNESIA) 400 MG/5ML suspension Take 30 mLs by mouth every 4 (four) hours as needed for mild constipation.     metoCLOPramide (REGLAN) 5 MG tablet Take 1 tablet (5 mg total) by mouth every 8 (eight) hours as needed for nausea. 20 tablet 1   Multiple  Vitamin (MULTIVITAMIN WITH MINERALS) TABS tablet Take 1 tablet by mouth daily.     NON FORMULARY Diet Type:  Regular - NO BEEF, PORK OR DAIRY PRODUCTS     nortriptyline (PAMELOR) 25 MG capsule Take 25 mg by mouth at bedtime.     Ostomy Supplies (SKIN PREP WIPES) MISC Apply to both heals daily     oxyCODONE (OXY IR/ROXICODONE) 5 MG immediate release tablet Take 0.5 tablets (2.5 mg total) by mouth every 12 hours as needed for severe pain. 14 tablet 0   Probiotic Product (RISA-BID PROBIOTIC) TABS Take 1 tablet by mouth daily.      rivaroxaban (XARELTO) 20 MG TABS tablet Take 1 tablet (20 mg total) by mouth daily with supper. 30 tablet    sennosides-docusate sodium (SENOKOT-S) 8.6-50 MG tablet Take 2 tablets by mouth 2 (two) times a day.     Skin Protectants, Misc. (ENDIT EX) Apply topically to bottom / peri-area after preforming peri-care     sodium phosphate (FLEET) 7-19 GM/118ML ENEM Place 1 enema rectally daily as needed for severe constipation.     spironolactone (ALDACTONE) 25 MG tablet Take 25 mg by mouth 2 (two) times daily.      tizanidine (ZANAFLEX) 2 MG capsule Take 2 mg by mouth every 8 (eight) hours.      No current facility-administered medications for this visit.     Allergies as of 09/28/2018 - Review Complete 09/27/2018  Allergen Reaction Noted   Other Swelling 11/25/2016   Lisinopril-hydrochlorothiazide Swelling 12/23/2015   Beef-derived products Swelling 11/28/2017   Dairy aid [lactase] Swelling 12/04/2017   Lisinopril-hydrochlorothiazide Swelling 11/28/2017   Pork-derived products Swelling 12/04/2017    Review of Systems:    All systems reviewed and negative except where noted in HPI.   Observations/Objective:  Labs: CMP     Component Value Date/Time   NA 139 09/27/2018 0641   K 3.7 09/27/2018 0641   CL 101 09/27/2018 0641   CO2 29 09/27/2018 0641   GLUCOSE 96 09/27/2018 0641   BUN 6 (L) 09/27/2018 0641   CREATININE 0.46 09/27/2018 0641    CALCIUM 9.0 09/27/2018 0641   PROT 6.3 (L) 09/27/2018 0641   ALBUMIN 3.5 09/27/2018 0641   AST 25 09/27/2018 0641   ALT 19 09/27/2018 0641   ALKPHOS 81 09/27/2018 0641   BILITOT 0.8 09/27/2018 0641   GFRNONAA >60 09/27/2018 0641   GFRAA >60 09/27/2018 0641   Lab Results  Component Value Date   WBC 6.6 09/27/2018   HGB 12.5 09/27/2018   HCT 38.0 09/27/2018   MCV 92.2 09/27/2018   PLT 392 09/27/2018    Imaging Studies: Ct Abdomen Pelvis W Contrast  Result Date: 09/27/2018 CLINICAL DATA:  Bowel obstruction. EXAM: CT ABDOMEN AND PELVIS WITH CONTRAST TECHNIQUE: Multidetector CT imaging of the abdomen and pelvis was performed using the standard protocol following bolus administration of intravenous contrast. CONTRAST:  61mL OMNIPAQUE IOHEXOL 300 MG/ML  SOLN COMPARISON:  CT scan of Jun 14, 2018. FINDINGS: Lower chest:  No acute abnormality. Hepatobiliary: No focal liver abnormality is seen. No gallstones, gallbladder wall thickening, or biliary dilatation. Pancreas: Unremarkable. No pancreatic ductal dilatation or surrounding inflammatory changes. Spleen: Normal in size without focal abnormality. Adrenals/Urinary Tract: Adrenal glands appear normal. Bilateral renal cysts are noted. No hydronephrosis or renal obstruction is noted. No renal or ureteral calculi are noted. Foley catheter is present within the urinary bladder. Stomach/Bowel: The stomach appears normal. There is no evidence of bowel obstruction or inflammation. The appendix is not visualized. Vascular/Lymphatic: Aortic atherosclerosis. No enlarged abdominal or pelvic lymph nodes. Reproductive: Small calcified uterine fibroids are noted. No adnexal abnormality is noted. Other: No abdominal wall hernia or abnormality. No abdominopelvic ascites. Musculoskeletal: No acute or significant osseous findings. IMPRESSION: Small calcified uterine fibroids. No definite evidence of bowel obstruction or inflammation is noted. Aortic Atherosclerosis  (ICD10-I70.0). Electronically Signed   By: Marijo Conception M.D.   On: 09/27/2018 09:04    Assessment and Plan:   Nina A Fank is a 84 y.o. y/o female here today to follow-upfor constipation which in turn probably related to spinal cord injury. Failed lactulose, senna and dulcolax.  Hospital presentation yesterday but CT scan of the abdomen showed no constipation.  Her main complaint is bloating of the abdomen.  We have thought that the bloating may be secondary to the medication she has been taking which includes enemas, magnesium citrate, Linzess.  Plan  1.  We decided that it probably best to start from the beginning we will stop all of these medications and start her on fiber pills which have worked in the past.  1 fiber pill twice a day.  Samples will be provided.  In addition will commence her on MiraLAX 1 capful a day. 2.  We will check on her in a week's time with a telephone visit to see how things are going if not working we will step up the treatment again.  We have previously talked about a colonoscopy but felt it would be hard for her to prep as she is wheelchair-bound.   I discussed the assessment and treatment plan with the patient. The patient was provided an opportunity to ask questions and all were answered. The patient agreed with the plan and demonstrated an understanding of the instructions.   The patient was advised to call back or seek an in-person evaluation if the symptoms worsen or if the condition fails to improve as anticipated.  I provided 13 minutes of non-face-to-face time during this encounter.  Dr Sherry Bellows MD,MRCP [facility]) Gastroenterology/Hepatology Pager: [phone]   Speech recognition software was used to dictate this note.

## 2018-10-02 ENCOUNTER — Telehealth: Payer: Self-pay | Admitting: Gastroenterology

## 2018-10-02 NOTE — Telephone Encounter (Signed)
Patient's daughter called stating patient was feeling good on Friday after the medication change on Thursday.Saturday felt constipated & Sunday she had a BM after experingson  Bloating she was given an intima. Today she is also having bloating & constipation. Please call daughter [name] with plan.

## 2018-10-03 ENCOUNTER — Encounter
Admission: RE | Admit: 2018-10-03 | Discharge: 2018-10-03 | Disposition: A | Discharge status: Home / Self Care | Payer: Medicare HMO | Source: Ambulatory Visit | Attending: Internal Medicine | Admitting: Internal Medicine

## 2018-10-03 NOTE — Telephone Encounter (Signed)
Spoke with pt daughter, she states pt is not able to have a bowel movement on her own due to her spinal injury. Pt is requesting that we call in an order for Magnesium Citrate PRN, as it seemed to work better than the enemas did. I explained that I will relay this request to Dr. Vicente Males and then inform her of his recommendations.

## 2018-10-03 NOTE — Telephone Encounter (Signed)
Spoke to the daughter she is having better results but not completely satisfactory.  Last bowel movement was on Saturday.  I suggested that we increase the MiraLAX to twice a day.  She is on charcoal tablets twice a day but the daughter wanted to be changed to as needed.  Magnesium citrate to be administered as needed if not having a bowel movement for a few days.Can you please call this into the home that the patient is staying at so these orders get transcribed.  Dr [name] MD,MRCP [facility]) Gastroenterology/Hepatology Pager: [phone]

## 2018-10-04 NOTE — Telephone Encounter (Signed)
Spoke with pt daughter and informed her of Dr. Georgeann Oppenheim orders for pt's medications. I explained that I have called the orders in to pt's assisted living facility. She agrees with Dr. Georgeann Oppenheim plans and plans to keep [place] updated on pt condition.

## 2018-10-04 NOTE — Telephone Encounter (Signed)
Sherry [name] daughter called stating mom is at [place] is refusing to eat because she feels so full. She's not sure what the orders where since 10-03-18 but does not feel [facility] has received them. The original order is for an enima every 3 days. Today she can get one but that does not get everything out. Please advise & call daughter.    Sherry [name] is very worried about the patient.

## 2018-10-05 ENCOUNTER — Ambulatory Visit: Payer: Medicare HMO | Admitting: Gastroenterology

## 2018-10-10 ENCOUNTER — Ambulatory Visit (INDEPENDENT_AMBULATORY_CARE_PROVIDER_SITE_OTHER): Payer: Medicare HMO | Admitting: Gastroenterology

## 2018-10-10 DIAGNOSIS — R140 Abdominal distension (gaseous): Secondary | ICD-10-CM

## 2018-10-10 DIAGNOSIS — K5900 Constipation, unspecified: Secondary | ICD-10-CM

## 2018-10-10 NOTE — Progress Notes (Signed)
Sherry [name] , MD [address]  [place]  [place], [place] [address]  Main: [phone]  Fax: [phone]   Primary Care Physician: Glendon Axe, MD  Virtual Visit via Telephone Note  I connected with patient on 10/10/18 at  3:00 PM EDT by telephone and verified that I am speaking with the correct person using two identifiers.   I discussed the limitations, risks, security and privacy concerns of performing an evaluation and management service by telephone and the availability of in person appointments. I also discussed with the patient that there may be a patient responsible charge related to this service. The patient expressed understanding and agreed to proceed.  Location of Patient: Home Location of Provider: Home Persons involved: Patient and provider only   History of Present Illness: No chief complaint on file.   HPI: Sherry [name] is a 84 y.o. female   Summary of history :  Shewas initially referred and seen on 7/7/2020referred for abdominal distension and abdominal pain. H/o central cord synrome C 6 level, on lactulose and senna for constipation.  All began end of April - developed appendicitis. Treated with IV antibiotics . Felt by Dr Peyton Najjar that the appendicitis was caused by the constipation. She says that the constipation ongoing for a while - when she moved to a different place the bowel regime was changed and then things changed.   Ct abdomen 06/2018 Large colorectal stool burden with fecal impaction. Periappendiceal phlegmon decreased in size. Bladder wall thickening . Cystitis not excluded. H/o acute appendicitis . UA on 07/10/2018 shows leucocyts and positive for nitrites and bacteria. Multiple bacteria seen on culture. Cervical cord injury in 11/2017 from a fall. Previously commenced on Linzess 145 mcg which helped but not completely and an x-ray of the abdomen still showed presence of stool and subsequently increased to to 290 mcg of  Linzess. She went to the ED on 09/27/2018 with abdominal distention and crampy pain.  She felt distended.  CT scan of the abdomen showed no evidence of bowel obstruction or inflammation.  Subsequently discharged.  Interval history8/27/2020-10/10/2018   Spoke to patient and daughter on 09/28/2018.    Overall it was felt that bloating may have been a side effect from the Linzess and other laxative she has been taking which includes enemas and magnesium citrate.  Decided to start treating her with only fiber pills and MiraLAX twice daily.  Since her last telephone visit she has had some success but she feels it has not helped her immensely and she has still needed to take enemas every 3 days.  Despite which she feels bloated and does not feel satisfied after a bowel movement.     Current Outpatient Medications  Medication Sig Dispense Refill   acetaminophen (TYLENOL) 325 MG tablet Take 650 mg by mouth every 6 (six) hours.      Alpha-D-Galactosidase (BEANO) TABS Take 150 mg by mouth 3 (three) times daily with meals as needed (Bloating). Give 2 tabs = 300 mg     alum & mag hydroxide-simeth (MAALOX PLUS) 400-400-40 MG/5ML suspension Take 30 mLs by mouth every 4 (four) hours as needed for indigestion.     amLODipine (NORVASC) 10 MG tablet Take 10 mg by mouth daily after supper.      bisacodyl (DULCOLAX) 10 MG suppository Place 10 mg rectally daily as needed for moderate constipation.     calcium carbonate (TUMS EX) 750 MG chewable tablet Chew 1 tablet by mouth 3 (three) times daily.  Capsaicin 0.1 % CREA Apply 1 application topically 2 (two) times daily. Apply to left shoulder, bilateral hands, and knees     DULoxetine (CYMBALTA) 60 MG capsule Take 60 mg by mouth daily.      gabapentin (NEURONTIN) 400 MG capsule Take 400 mg by mouth every 6 (six) hours.     lactulose (CHRONULAC) 10 GM/15ML solution Take 30 g by mouth daily.     loratadine (CLARITIN) 10 MG tablet Take 10 mg by mouth  daily.      magnesium hydroxide (MILK OF MAGNESIA) 400 MG/5ML suspension Take 30 mLs by mouth every 4 (four) hours as needed for mild constipation.     metoCLOPramide (REGLAN) 5 MG tablet Take 1 tablet (5 mg total) by mouth every 8 (eight) hours as needed for nausea. 20 tablet 1   Multiple Vitamin (MULTIVITAMIN WITH MINERALS) TABS tablet Take 1 tablet by mouth daily.     NON FORMULARY Diet Type:  Regular - NO BEEF, PORK OR DAIRY PRODUCTS     nortriptyline (PAMELOR) 25 MG capsule Take 25 mg by mouth at bedtime.     Ostomy Supplies (SKIN PREP WIPES) MISC Apply to both heals daily     oxyCODONE (OXY IR/ROXICODONE) 5 MG immediate release tablet Take 0.5 tablets (2.5 mg total) by mouth every 12 hours as needed for severe pain. 14 tablet 0   Probiotic Product (RISA-BID PROBIOTIC) TABS Take 1 tablet by mouth daily.      rivaroxaban (XARELTO) 20 MG TABS tablet Take 1 tablet (20 mg total) by mouth daily with supper. 30 tablet    sennosides-docusate sodium (SENOKOT-S) 8.6-50 MG tablet Take 2 tablets by mouth 2 (two) times a day.     Skin Protectants, Misc. (ENDIT EX) Apply topically to bottom / peri-area after preforming peri-care     sodium phosphate (FLEET) 7-19 GM/118ML ENEM Place 1 enema rectally daily as needed for severe constipation.     spironolactone (ALDACTONE) 25 MG tablet Take 25 mg by mouth 2 (two) times daily.      tizanidine (ZANAFLEX) 2 MG capsule Take 2 mg by mouth every 8 (eight) hours.      No current facility-administered medications for this visit.     Allergies as of 10/10/2018 - Review Complete 09/27/2018  Allergen Reaction Noted   Other Swelling 11/25/2016   Lisinopril-hydrochlorothiazide Swelling 12/23/2015   Beef-derived products Swelling 11/28/2017   Dairy aid [lactase] Swelling 12/04/2017   Lisinopril-hydrochlorothiazide Swelling 11/28/2017   Pork-derived products Swelling 12/04/2017    Review of Systems:    All systems reviewed and negative except  where noted in HPI.   Observations/Objective:  Labs: CMP     Component Value Date/Time   NA 139 09/27/2018 0641   K 3.7 09/27/2018 0641   CL 101 09/27/2018 0641   CO2 29 09/27/2018 0641   GLUCOSE 96 09/27/2018 0641   BUN 6 (L) 09/27/2018 0641   CREATININE 0.46 09/27/2018 0641   CALCIUM 9.0 09/27/2018 0641   PROT 6.3 (L) 09/27/2018 0641   ALBUMIN 3.5 09/27/2018 0641   AST 25 09/27/2018 0641   ALT 19 09/27/2018 0641   ALKPHOS 81 09/27/2018 0641   BILITOT 0.8 09/27/2018 0641   GFRNONAA >60 09/27/2018 0641   GFRAA >60 09/27/2018 0641   Lab Results  Component Value Date   WBC 6.6 09/27/2018   HGB 12.5 09/27/2018   HCT 38.0 09/27/2018   MCV 92.2 09/27/2018   PLT 392 09/27/2018    Imaging Studies: Ct Abdomen Pelvis W  Contrast  Result Date: 09/27/2018 CLINICAL DATA:  Bowel obstruction. EXAM: CT ABDOMEN AND PELVIS WITH CONTRAST TECHNIQUE: Multidetector CT imaging of the abdomen and pelvis was performed using the standard protocol following bolus administration of intravenous contrast. CONTRAST:  61mL OMNIPAQUE IOHEXOL 300 MG/ML  SOLN COMPARISON:  CT scan of Jun 14, 2018. FINDINGS: Lower chest: No acute abnormality. Hepatobiliary: No focal liver abnormality is seen. No gallstones, gallbladder wall thickening, or biliary dilatation. Pancreas: Unremarkable. No pancreatic ductal dilatation or surrounding inflammatory changes. Spleen: Normal in size without focal abnormality. Adrenals/Urinary Tract: Adrenal glands appear normal. Bilateral renal cysts are noted. No hydronephrosis or renal obstruction is noted. No renal or ureteral calculi are noted. Foley catheter is present within the urinary bladder. Stomach/Bowel: The stomach appears normal. There is no evidence of bowel obstruction or inflammation. The appendix is not visualized. Vascular/Lymphatic: Aortic atherosclerosis. No enlarged abdominal or pelvic lymph nodes. Reproductive: Small calcified uterine fibroids are noted. No adnexal  abnormality is noted. Other: No abdominal wall hernia or abnormality. No abdominopelvic ascites. Musculoskeletal: No acute or significant osseous findings. IMPRESSION: Small calcified uterine fibroids. No definite evidence of bowel obstruction or inflammation is noted. Aortic Atherosclerosis (ICD10-I70.0). Electronically Signed   By: Marijo Conception M.D.   On: 09/27/2018 09:04    Assessment and Plan:   Sarann A Harrell is a 84 y.o. y/o female here today to follow-upfor constipation which in turn probably related to spinal cord injury. Failed lactulose, senna and dulcolax.  Hospital presentation yesterday but CT scan of the abdomen showed no constipation.  Her main complaint is bloating of the abdomen.  Previously felt due to [place].  And hence we have discontinued it.  Plan  1.    Continue fiber pills and add Trulance daily samples will be provided. 2.  Suggest to commence on Trulance after an enema to have a good bowel movement. 3.  Follow-up telephone visit in 7 to 10 days to discuss results.    I discussed the assessment and treatment plan with the patient. The patient was provided an opportunity to ask questions and all were answered. The patient agreed with the plan and demonstrated an understanding of the instructions.   The patient was advised to call back or seek an in-person evaluation if the symptoms worsen or if the condition fails to improve as anticipated.  I provided 12 minutes of non-face-to-face time during this encounter.  Dr Sherry Bellows MD,MRCP [facility]) Gastroenterology/Hepatology Pager: [phone]   Speech recognition software was used to dictate this note.

## 2018-10-12 ENCOUNTER — Telehealth: Payer: Self-pay | Admitting: Gastroenterology

## 2018-10-12 NOTE — Telephone Encounter (Signed)
Sherry [name] patient's daughter came in to pick up samples & ask that an order be sent to [facility] to change the charcoal medication from every 12hrs to every 8 hrs.

## 2018-10-17 NOTE — Telephone Encounter (Signed)
Pt daughter called and requested that pt order for charcoal tablets be changed to every 6 hours. Dr. Vicente Males agrees. I have called the order into pt assisted living facility.

## 2018-10-23 ENCOUNTER — Ambulatory Visit (INDEPENDENT_AMBULATORY_CARE_PROVIDER_SITE_OTHER): Payer: Medicare HMO | Admitting: Gastroenterology

## 2018-10-23 DIAGNOSIS — R140 Abdominal distension (gaseous): Secondary | ICD-10-CM

## 2018-10-23 DIAGNOSIS — K5900 Constipation, unspecified: Secondary | ICD-10-CM | POA: Diagnosis not present

## 2018-10-23 NOTE — Progress Notes (Signed)
Sherry [name] , MD [address]  [place]  [place], [place] [address]  Main: [phone]  Fax: [phone]   Primary Care Physician: Glendon Axe, MD  Virtual Visit via Telephone Note  I connected with patient on 10/23/18 at  2:00 PM EDT by telephone and verified that I am speaking with the correct person using two identifiers.   I discussed the limitations, risks, security and privacy concerns of performing an evaluation and management service by telephone and the availability of in person appointments. I also discussed with the patient that there may be a patient responsible charge related to this service. The patient expressed understanding and agreed to proceed.  Location of Patient: Home Location of Provider: Home Persons involved: Patient and provider only   History of Present Illness: No chief complaint on file.   HPI: Sherry [name] is a 84 y.o. female   Summary of history :  Shewas initially referred and seen on 7/7/2020referred for abdominal distension and abdominal pain. H/o central cord synrome C 6 level, on lactulose and senna for constipation.  All began end of April - developed appendicitis. Treated with IV antibiotics . Felt by Dr Peyton Najjar that the appendicitis was caused by the constipation. She says that the constipationongoingfor a while - when she moved to a different place the bowel regime was changed and then things changed.   Ct abdomen 06/2018 Large colorectal stool burden with fecal impaction. Periappendiceal phlegmon decreased in size. Bladder wall thickening . Cystitis not excluded. H/o acute appendicitis . UA on 07/10/2018 shows leucocyts and positive for nitrites and bacteria. Multiple bacteria seen on culture. Cervical cord injury in 11/2017 from a fall. Previously commenced on Linzess 145 mcg which helped but not completely and an x-ray of the abdomen still showed presence of stool and subsequently increased to to 290 mcg of  Linzess. She went to the ED on 09/27/2018 with abdominal distention and crampy pain. She felt distended. CT scan of the abdomen showed no evidence of bowel obstruction or inflammation. Subsequently discharged.   Interval history9/09/2018-10/23/2018  Had a conference call with the daughter and patient today.  At her last visit I changed her to Trulance along with fiber pills.  Since then she is been having bowel movements daily not requiring as many enemas as she required in the past but still feels bloated.  She takes charcoal tablets 4 times a day.    Current Outpatient Medications  Medication Sig Dispense Refill   acetaminophen (TYLENOL) 325 MG tablet Take 650 mg by mouth every 6 (six) hours.      Alpha-D-Galactosidase (BEANO) TABS Take 150 mg by mouth 3 (three) times daily with meals as needed (Bloating). Give 2 tabs = 300 mg     alum & mag hydroxide-simeth (MAALOX PLUS) 400-400-40 MG/5ML suspension Take 30 mLs by mouth every 4 (four) hours as needed for indigestion.     amLODipine (NORVASC) 10 MG tablet Take 10 mg by mouth daily after supper.      bisacodyl (DULCOLAX) 10 MG suppository Place 10 mg rectally daily as needed for moderate constipation.     calcium carbonate (TUMS EX) 750 MG chewable tablet Chew 1 tablet by mouth 3 (three) times daily.     Capsaicin 0.1 % CREA Apply 1 application topically 2 (two) times daily. Apply to left shoulder, bilateral hands, and knees     DULoxetine (CYMBALTA) 60 MG capsule Take 60 mg by mouth daily.      gabapentin (NEURONTIN)  400 MG capsule Take 400 mg by mouth every 6 (six) hours.     lactulose (CHRONULAC) 10 GM/15ML solution Take 30 g by mouth daily.     loratadine (CLARITIN) 10 MG tablet Take 10 mg by mouth daily.      magnesium hydroxide (MILK OF MAGNESIA) 400 MG/5ML suspension Take 30 mLs by mouth every 4 (four) hours as needed for mild constipation.     metoCLOPramide (REGLAN) 5 MG tablet Take 1 tablet (5 mg total) by mouth  every 8 (eight) hours as needed for nausea. 20 tablet 1   Multiple Vitamin (MULTIVITAMIN WITH MINERALS) TABS tablet Take 1 tablet by mouth daily.     NON FORMULARY Diet Type:  Regular - NO BEEF, PORK OR DAIRY PRODUCTS     nortriptyline (PAMELOR) 25 MG capsule Take 25 mg by mouth at bedtime.     Ostomy Supplies (SKIN PREP WIPES) MISC Apply to both heals daily     oxyCODONE (OXY IR/ROXICODONE) 5 MG immediate release tablet Take 0.5 tablets (2.5 mg total) by mouth every 12 hours as needed for severe pain. 14 tablet 0   Probiotic Product (RISA-BID PROBIOTIC) TABS Take 1 tablet by mouth daily.      rivaroxaban (XARELTO) 20 MG TABS tablet Take 1 tablet (20 mg total) by mouth daily with supper. 30 tablet    sennosides-docusate sodium (SENOKOT-S) 8.6-50 MG tablet Take 2 tablets by mouth 2 (two) times a day.     Skin Protectants, Misc. (ENDIT EX) Apply topically to bottom / peri-area after preforming peri-care     sodium phosphate (FLEET) 7-19 GM/118ML ENEM Place 1 enema rectally daily as needed for severe constipation.     spironolactone (ALDACTONE) 25 MG tablet Take 25 mg by mouth 2 (two) times daily.      tizanidine (ZANAFLEX) 2 MG capsule Take 2 mg by mouth every 8 (eight) hours.      No current facility-administered medications for this visit.     Allergies as of 10/23/2018 - Review Complete 09/27/2018  Allergen Reaction Noted   Other Swelling 11/25/2016   Lisinopril-hydrochlorothiazide Swelling 12/23/2015   Beef-derived products Swelling 11/28/2017   Dairy aid [lactase] Swelling 12/04/2017   Lisinopril-hydrochlorothiazide Swelling 11/28/2017   Pork-derived products Swelling 12/04/2017    Review of Systems:    All systems reviewed and negative except where noted in HPI.   Observations/Objective:  Labs: CMP     Component Value Date/Time   NA 139 09/27/2018 0641   K 3.7 09/27/2018 0641   CL 101 09/27/2018 0641   CO2 29 09/27/2018 0641   GLUCOSE 96 09/27/2018 0641     BUN 6 (L) 09/27/2018 0641   CREATININE 0.46 09/27/2018 0641   CALCIUM 9.0 09/27/2018 0641   PROT 6.3 (L) 09/27/2018 0641   ALBUMIN 3.5 09/27/2018 0641   AST 25 09/27/2018 0641   ALT 19 09/27/2018 0641   ALKPHOS 81 09/27/2018 0641   BILITOT 0.8 09/27/2018 0641   GFRNONAA >60 09/27/2018 0641   GFRAA >60 09/27/2018 0641   Lab Results  Component Value Date   WBC 6.6 09/27/2018   HGB 12.5 09/27/2018   HCT 38.0 09/27/2018   MCV 92.2 09/27/2018   PLT 392 09/27/2018    Imaging Studies: Ct Abdomen Pelvis W Contrast  Result Date: 09/27/2018 CLINICAL DATA:  Bowel obstruction. EXAM: CT ABDOMEN AND PELVIS WITH CONTRAST TECHNIQUE: Multidetector CT imaging of the abdomen and pelvis was performed using the standard protocol following bolus administration of intravenous contrast. CONTRAST:  21mL  OMNIPAQUE IOHEXOL 300 MG/ML  SOLN COMPARISON:  CT scan of Jun 14, 2018. FINDINGS: Lower chest: No acute abnormality. Hepatobiliary: No focal liver abnormality is seen. No gallstones, gallbladder wall thickening, or biliary dilatation. Pancreas: Unremarkable. No pancreatic ductal dilatation or surrounding inflammatory changes. Spleen: Normal in size without focal abnormality. Adrenals/Urinary Tract: Adrenal glands appear normal. Bilateral renal cysts are noted. No hydronephrosis or renal obstruction is noted. No renal or ureteral calculi are noted. Foley catheter is present within the urinary bladder. Stomach/Bowel: The stomach appears normal. There is no evidence of bowel obstruction or inflammation. The appendix is not visualized. Vascular/Lymphatic: Aortic atherosclerosis. No enlarged abdominal or pelvic lymph nodes. Reproductive: Small calcified uterine fibroids are noted. No adnexal abnormality is noted. Other: No abdominal wall hernia or abnormality. No abdominopelvic ascites. Musculoskeletal: No acute or significant osseous findings. IMPRESSION: Small calcified uterine fibroids. No definite evidence of bowel  obstruction or inflammation is noted. Aortic Atherosclerosis (ICD10-I70.0). Electronically Signed   By: Marijo Conception M.D.   On: 09/27/2018 09:04    Assessment and Plan:   Kenadee A Baik is a 84 y.o. y/o female here today to follow-upfor constipation which in turn probably related to spinal cord injury. Failed lactulose, senna and dulcolax.Her main complaint is bloating of the abdomen.  Previously felt due to [place].  And hence we have discontinued it.  MiraLAX failed as well commenced on Trulance 10/10/2018 presently she does have regular bowel movements but her issues mainly bloating.  She has been on charcoal tablets.  I believe the bloating is probably related to the loss of tone in her small bowel probably from the spinal cord injury.  I did explain to patient as well as daughter that this probably limitation on what we can do in terms of pharmacotherapy.  We have also briefly discussed that if things were to get significantly worse a colostomy may be an option as well.  Plan  1.  Continue fiber pills and Trulance, decrease Trulance frequency to every other day.  Continue charcoal tablets 4 times a day 2.  Follow-up telephone visit in 2 to 3 weeks  I discussed the assessment and treatment plan with the patient. The patient was provided an opportunity to ask questions and all were answered. The patient agreed with the plan and demonstrated an understanding of the instructions.   The patient was advised to call back or seek an in-person evaluation if the symptoms worsen or if the condition fails to improve as anticipated.  I provided 14 minutes of non-face-to-face time during this encounter.  Dr Sherry Bellows MD,MRCP [facility]) Gastroenterology/Hepatology Pager: [phone]   Speech recognition software was used to dictate this note.

## 2018-10-24 ENCOUNTER — Telehealth: Payer: Self-pay

## 2018-10-24 NOTE — Telephone Encounter (Signed)
Spoke with pt nursing home and gave Dr. Georgeann Oppenheim new medication orders to decrease the Trulance to every other day and to change the fiber choice to [employer].

## 2018-10-26 ENCOUNTER — Telehealth: Payer: Self-pay

## 2018-10-26 NOTE — Telephone Encounter (Signed)
Pt daughter left vm regarding her prev. Message she left please call pt

## 2018-10-26 NOTE — Telephone Encounter (Signed)
Charcoal tablets can be taken probably before meals.  Trulance suggest to take in the morning.  Fiber in the evening or night.  We can try this and see how she does.

## 2018-10-26 NOTE — Telephone Encounter (Signed)
Pt daughter, [name], called requesting Dr. Georgeann Oppenheim opinion about possibly adjusting pt medication regimen to help with the bloating. Pt daughter asks if spacing out time in between pt taking the Trulance, fiber, and charcoal tabs could help? For example changing the time pt takes the Trulance from morning to bedtime and take fiber before breakfast or vice versa? And when would be the best time for pt to take the charcoal tabs? I explained that I will relay this request to Dr. Vicente Males.

## 2018-10-27 NOTE — Telephone Encounter (Signed)
Spoke with pt daughter and informed her of Dr. Georgeann Oppenheim recommendations. She agrees. I have contacted pt nursing home to update the order.

## 2018-11-02 ENCOUNTER — Encounter
Admission: RE | Admit: 2018-11-02 | Discharge: 2018-11-02 | Disposition: A | Discharge status: Home / Self Care | Payer: Medicare HMO | Source: Ambulatory Visit | Attending: Internal Medicine | Admitting: Internal Medicine

## 2018-11-06 ENCOUNTER — Ambulatory Visit (INDEPENDENT_AMBULATORY_CARE_PROVIDER_SITE_OTHER): Payer: Medicare HMO | Admitting: Gastroenterology

## 2018-11-06 ENCOUNTER — Telehealth: Payer: Self-pay

## 2018-11-06 DIAGNOSIS — K219 Gastro-esophageal reflux disease without esophagitis: Secondary | ICD-10-CM

## 2018-11-06 DIAGNOSIS — K5900 Constipation, unspecified: Secondary | ICD-10-CM

## 2018-11-06 DIAGNOSIS — Z1211 Encounter for screening for malignant neoplasm of colon: Secondary | ICD-10-CM

## 2018-11-06 NOTE — Telephone Encounter (Signed)
Called the nursing home and gave the changed to Terri to change Trulance to everyday

## 2018-11-06 NOTE — Telephone Encounter (Signed)
Per Dr. Vicente Males: Can you call the nursing home where she says and give orders to change taking Trulance from once every other day to once daily. She also requires authorization from her insurance company to prescribe this as she has failed all other medications. Her daughter said the insurance denied it or was not willing to authorize it we need to find out what is going on.  Tried to call the nursing home left a message for call back. Will send PA

## 2018-11-06 NOTE — Telephone Encounter (Signed)
Sent PA through covermymeds.

## 2018-11-06 NOTE — Progress Notes (Signed)
Sherry [name] , MD [address]  [place]  [place], [place] [address]  Main: [phone]  Fax: [phone]   Primary Care Physician: Glendon Axe, MD  Virtual Visit via Telephone Note  I connected with patient on 11/06/18 at  3:00 PM EDT by telephone and verified that I am speaking with the correct person using two identifiers.   I discussed the limitations, risks, security and privacy concerns of performing an evaluation and management service by telephone and the availability of in person appointments. I also discussed with the patient that there may be a patient responsible charge related to this service. The patient expressed understanding and agreed to proceed.  Location of Patient: Home Location of Provider: Home Persons involved: Patient and provider only   History of Present Illness: Chief Complaint  Patient presents with  . Follow-up Constipation    Taking Trulance every other day.  Takes Fiber at night.    HPI: Sherry [name] is a 84 y.o. female   Summary of history :  Shewas initially referred and seen on 7/7/2020referred for abdominal distension and abdominal pain. H/o central cord synrome C 6 level, on lactulose and senna for constipation.  All began end of April - developed appendicitis. Treated with IV antibiotics . Felt by Dr Peyton Najjar that the appendicitis was caused by the constipation. She says that the constipationongoingfor a while - when she moved to a different place the bowel regime was changed and then things changed.   Ct abdomen 06/2018 Large colorectal stool burden with fecal impaction. Periappendiceal phlegmon decreased in size. Bladder wall thickening . Cystitis not excluded. H/o acute appendicitis . UA on 07/10/2018 shows leucocyts and positive for nitrites and bacteria. Multiple bacteria seen on culture. Cervical cord injury in 11/2017 from a fall. Previously commenced on Linzess 145 mcg which helped but not completely and an x-ray  of the abdomen still showed presence of stool and subsequently increased to to290 mcg of Linzess. She went to the ED on 09/27/2018 with abdominal distention and crampy pain. She felt distended. CT scan of the abdomen showed no evidence of bowel obstruction or inflammation. Subsequently discharged.   Interval history9/21/2020-11/06/2018   I had a conference call with the patient and daughter.  She has been doing really well after taking the Trulance every other day and fiber at night.  She has not required a fleets enema for over a week.  No complaints today.  Takes charcoal tablets as needed.  Current Outpatient Medications  Medication Sig Dispense Refill  . acetaminophen (TYLENOL) 325 MG tablet Take 650 mg by mouth every 6 (six) hours.     . Alpha-D-Galactosidase (BEANO) TABS Take 150 mg by mouth 3 (three) times daily with meals as needed (Bloating). Give 2 tabs = 300 mg    . alum & mag hydroxide-simeth (MAALOX PLUS) 400-400-40 MG/5ML suspension Take 30 mLs by mouth every 4 (four) hours as needed for indigestion.    Marland Kitchen amLODipine (NORVASC) 10 MG tablet Take 10 mg by mouth daily after supper.     . bisacodyl (DULCOLAX) 10 MG suppository Place 10 mg rectally daily as needed for moderate constipation.    . calcium carbonate (TUMS EX) 750 MG chewable tablet Chew 1 tablet by mouth 3 (three) times daily.    . Capsaicin 0.1 % CREA Apply 1 application topically 2 (two) times daily. Apply to left shoulder, bilateral hands, and knees    . DULoxetine (CYMBALTA) 60 MG capsule Take 60 mg by  mouth daily.     Marland Kitchen gabapentin (NEURONTIN) 400 MG capsule Take 400 mg by mouth every 6 (six) hours.    Marland Kitchen lactulose (CHRONULAC) 10 GM/15ML solution Take 30 g by mouth daily.    Marland Kitchen loratadine (CLARITIN) 10 MG tablet Take 10 mg by mouth daily.     . magnesium hydroxide (MILK OF MAGNESIA) 400 MG/5ML suspension Take 30 mLs by mouth every 4 (four) hours as needed for mild constipation.    . Multiple Vitamin (MULTIVITAMIN  WITH MINERALS) TABS tablet Take 1 tablet by mouth daily.    Marland Kitchen oxyCODONE (OXY IR/ROXICODONE) 5 MG immediate release tablet Take 0.5 tablets (2.5 mg total) by mouth every 12 hours as needed for severe pain. 14 tablet 0  . Probiotic Product (RISA-BID PROBIOTIC) TABS Take 1 tablet by mouth daily.     . rivaroxaban (XARELTO) 20 MG TABS tablet Take 1 tablet (20 mg total) by mouth daily with supper. 30 tablet   . sennosides-docusate sodium (SENOKOT-S) 8.6-50 MG tablet Take 2 tablets by mouth 2 (two) times a day.    . Skin Protectants, Misc. (ENDIT EX) Apply topically to bottom / peri-area after preforming peri-care    . sodium phosphate (FLEET) 7-19 GM/118ML ENEM Place 1 enema rectally daily as needed for severe constipation.    Marland Kitchen spironolactone (ALDACTONE) 25 MG tablet Take 25 mg by mouth 2 (two) times daily.     . metoCLOPramide (REGLAN) 5 MG tablet Take 1 tablet (5 mg total) by mouth every 8 (eight) hours as needed for nausea. (Patient not taking: Reported on 11/06/2018) 20 tablet 1  . NON FORMULARY Diet Type:  Regular - NO BEEF, PORK OR DAIRY PRODUCTS    . nortriptyline (PAMELOR) 25 MG capsule Take 25 mg by mouth at bedtime.    Oneta Rack Supplies (SKIN PREP WIPES) MISC Apply to both heals daily    . tizanidine (ZANAFLEX) 2 MG capsule Take 2 mg by mouth every 8 (eight) hours.      No current facility-administered medications for this visit.     Allergies as of 11/06/2018 - Review Complete 09/27/2018  Allergen Reaction Noted  . Other Swelling 11/25/2016  . Lisinopril-hydrochlorothiazide Swelling 12/23/2015  . Beef-derived products Swelling 11/28/2017  . Dairy aid [lactase] Swelling 12/04/2017  . Lisinopril-hydrochlorothiazide Swelling 11/28/2017  . Pork-derived products Swelling 12/04/2017    Review of Systems:    All systems reviewed and negative except where noted in HPI.   Observations/Objective:  Labs: CMP     Component Value Date/Time   NA 139 09/27/2018 0641   K 3.7 09/27/2018  0641   CL 101 09/27/2018 0641   CO2 29 09/27/2018 0641   GLUCOSE 96 09/27/2018 0641   BUN 6 (L) 09/27/2018 0641   CREATININE 0.46 09/27/2018 0641   CALCIUM 9.0 09/27/2018 0641   PROT 6.3 (L) 09/27/2018 0641   ALBUMIN 3.5 09/27/2018 0641   AST 25 09/27/2018 0641   ALT 19 09/27/2018 0641   ALKPHOS 81 09/27/2018 0641   BILITOT 0.8 09/27/2018 0641   GFRNONAA >60 09/27/2018 0641   GFRAA >60 09/27/2018 0641   Lab Results  Component Value Date   WBC 6.6 09/27/2018   HGB 12.5 09/27/2018   HCT 38.0 09/27/2018   MCV 92.2 09/27/2018   PLT 392 09/27/2018    Imaging Studies: No results found.  Assessment and Plan:   Mardella A Weaks is a 84 y.o. y/o femalehere today to follow-upfor constipation which in turn probably related to spinal cord injury.  Failed lactulose, senna and dulcolax.Her main complaint is bloating of the abdomen.Previously felt due to [place]. And hence we have discontinued it.  MiraLAX failed as well commenced on Trulance 10/10/2018 presently she does have regular bowel movements but her issues mainly bloating.  She has been on charcoal tablets.  I believe the bloating is probably related to the loss of tone in her small bowel probably from the spinal cord injury.  I did explain to patient as well as daughter that this probably limitation on what we can do in terms of pharmacotherapy.  We have also briefly discussed that if things were to get significantly worse a colostomy may be an option as well.  Plan  1.The patient would like to Trulance once again every day.  She has failed amities and Linzess, MiraLAX, lactulose in the past.  She would like her help to get it authorized by her insurance company  F/u as needed   I discussed the assessment and treatment plan with the patient. The patient was provided an opportunity to ask questions and all were answered. The patient agreed with the plan and demonstrated an understanding of the instructions.   The patient  was advised to call back or seek an in-person evaluation if the symptoms worsen or if the condition fails to improve as anticipated.  I provided 12 minutes of non-face-to-face time during this encounter.  Dr Sherry Bellows MD,MRCP [facility]) Gastroenterology/Hepatology Pager: [phone]   Speech recognition software was used to dictate this note.

## 2018-12-11 ENCOUNTER — Encounter
Admission: RE | Admit: 2018-12-11 | Discharge: 2018-12-11 | Disposition: A | Discharge status: Home / Self Care | Payer: Medicare HMO | Source: Ambulatory Visit | Attending: Internal Medicine | Admitting: Internal Medicine

## 2018-12-12 ENCOUNTER — Telehealth: Payer: Self-pay | Admitting: Gastroenterology

## 2018-12-12 NOTE — Telephone Encounter (Signed)
Pt daughter [name] is calling pt would like [place] to fax an order to [facility] [place] Number [phone] she is taking  trulance every day and would like to start taking it every other day.

## 2018-12-12 NOTE — Telephone Encounter (Signed)
Yes

## 2018-12-13 NOTE — Telephone Encounter (Signed)
Spoke with pt nurse at nursing home and gave a verbal order to change Trulance to every other day.

## 2018-12-21 ENCOUNTER — Telehealth: Payer: Self-pay

## 2018-12-21 NOTE — Telephone Encounter (Signed)
Pt daughter [name] left vm to check the status of her vm

## 2018-12-21 NOTE — Telephone Encounter (Signed)
Set up telephone visit next week

## 2018-12-21 NOTE — Telephone Encounter (Signed)
Pt daughter called to inform Dr. Vicente Males that pt still hasn't had much improvement with her constipation and bloating. Pt would like to discuss possibly moving forward with the procedure.

## 2018-12-26 ENCOUNTER — Ambulatory Visit (INDEPENDENT_AMBULATORY_CARE_PROVIDER_SITE_OTHER): Payer: Medicare HMO | Admitting: Gastroenterology

## 2018-12-26 ENCOUNTER — Other Ambulatory Visit: Payer: Self-pay

## 2018-12-26 DIAGNOSIS — K592 Neurogenic bowel, not elsewhere classified: Secondary | ICD-10-CM | POA: Diagnosis not present

## 2018-12-26 NOTE — Progress Notes (Signed)
[name] , MD [address]  [place]  [place], [place] [address]  Main: [phone]  Fax: [phone]   Primary Care Physician: Glendon Axe, MD  Virtual Visit via Telephone Note  I connected with patient on 12/26/18 at  2:15 PM EST by telephone and verified that I am speaking with the correct person using two identifiers.   I discussed the limitations, risks, security and privacy concerns of performing an evaluation and management service by telephone and the availability of in person appointments. I also discussed with the patient that there may be a patient responsible charge related to this service. The patient expressed understanding and agreed to proceed.  Location of Patient: Home Location of Provider: Home Persons involved: Patient and provider only   History of Present Illness:   Follow-up on constipation  HPI: Sherry [name] is a 84 y.o. female   Summary of history :  Shewas initially referred and seen on 7/7/2020referred for abdominal distension and abdominal pain. H/o central cord synrome C 6 level, on lactulose and senna for constipation.  All began end of April - developed appendicitis. Treated with IV antibiotics . Felt by Dr Peyton Najjar that the appendicitis was caused by the constipation. She says that the constipationongoingfor a while - when she moved to a different place the bowel regime was changed and then things changed.   Ct abdomen 06/2018 Large colorectal stool burden with fecal impaction. Periappendiceal phlegmon decreased in size. Bladder wall thickening . Cystitis not excluded. H/o acute appendicitis . UA on 07/10/2018 shows leucocyts and positive for nitrites and bacteria. Multiple bacteria seen on culture. Cervical cord injury in 11/2017 from a fall. Previously commenced on Linzess 145 mcg which helped but not completely and an x-ray of the abdomen still showed presence of stool and subsequently increased to to290 mcg of  Linzess. She went to the ED on 09/27/2018 with abdominal distention and crampy pain. She felt distended. CT scan of the abdomen showed no evidence of bowel obstruction or inflammation. Subsequently discharged.   Interval history10/06/2018-12/26/2018  Had a conference call with the daughter and the patient.  States that she still suffers from bloating and irregular bowel movements.  Needs to take magnesium citrate and enemas probably not as often as previously but yet not to her satisfaction.  A long discussion with the patient and daughter saying that we probably have tried all agents that we have better disposable such as Amitiza, Linzess, lactulose, MiraLAX, Trulance in addition we have tried various combinations as well.  None of these have seemed to have provided adequate satisfaction.  I suggested that we could proceed with colonoscopy to rule out any luminal lesions and then consider ileostomy.     Current Outpatient Medications  Medication Sig Dispense Refill   acetaminophen (TYLENOL) 325 MG tablet Take 650 mg by mouth every 6 (six) hours.      Alpha-D-Galactosidase (BEANO) TABS Take 150 mg by mouth 3 (three) times daily with meals as needed (Bloating). Give 2 tabs = 300 mg     alum & mag hydroxide-simeth (MAALOX PLUS) 400-400-40 MG/5ML suspension Take 30 mLs by mouth every 4 (four) hours as needed for indigestion.     amLODipine (NORVASC) 10 MG tablet Take 10 mg by mouth daily after supper.      bisacodyl (DULCOLAX) 10 MG suppository Place 10 mg rectally daily as needed for moderate constipation.     calcium carbonate (TUMS EX) 750 MG chewable tablet Chew 1 tablet by  mouth 3 (three) times daily.     Capsaicin 0.1 % CREA Apply 1 application topically 2 (two) times daily. Apply to left shoulder, bilateral hands, and knees     DULoxetine (CYMBALTA) 60 MG capsule Take 60 mg by mouth daily.      gabapentin (NEURONTIN) 400 MG capsule Take 400 mg by mouth every 6 (six) hours.       lactulose (CHRONULAC) 10 GM/15ML solution Take 30 g by mouth daily.     loratadine (CLARITIN) 10 MG tablet Take 10 mg by mouth daily.      magnesium hydroxide (MILK OF MAGNESIA) 400 MG/5ML suspension Take 30 mLs by mouth every 4 (four) hours as needed for mild constipation.     metoCLOPramide (REGLAN) 5 MG tablet Take 1 tablet (5 mg total) by mouth every 8 (eight) hours as needed for nausea. (Patient not taking: Reported on 11/06/2018) 20 tablet 1   Multiple Vitamin (MULTIVITAMIN WITH MINERALS) TABS tablet Take 1 tablet by mouth daily.     NON FORMULARY Diet Type:  Regular - NO BEEF, PORK OR DAIRY PRODUCTS     nortriptyline (PAMELOR) 25 MG capsule Take 25 mg by mouth at bedtime.     Ostomy Supplies (SKIN PREP WIPES) MISC Apply to both heals daily     oxyCODONE (OXY IR/ROXICODONE) 5 MG immediate release tablet Take 0.5 tablets (2.5 mg total) by mouth every 12 hours as needed for severe pain. 14 tablet 0   Probiotic Product (RISA-BID PROBIOTIC) TABS Take 1 tablet by mouth daily.      rivaroxaban (XARELTO) 20 MG TABS tablet Take 1 tablet (20 mg total) by mouth daily with supper. 30 tablet    sennosides-docusate sodium (SENOKOT-S) 8.6-50 MG tablet Take 2 tablets by mouth 2 (two) times a day.     Skin Protectants, Misc. (ENDIT EX) Apply topically to bottom / peri-area after preforming peri-care     sodium phosphate (FLEET) 7-19 GM/118ML ENEM Place 1 enema rectally daily as needed for severe constipation.     spironolactone (ALDACTONE) 25 MG tablet Take 25 mg by mouth 2 (two) times daily.      tizanidine (ZANAFLEX) 2 MG capsule Take 2 mg by mouth every 8 (eight) hours.      No current facility-administered medications for this visit.     Allergies as of 12/26/2018 - Review Complete 09/27/2018  Allergen Reaction Noted   Other Swelling 11/25/2016   Lisinopril-hydrochlorothiazide Swelling 12/23/2015   Beef-derived products Swelling 11/28/2017   Dairy aid [lactase] Swelling  12/04/2017   Lisinopril-hydrochlorothiazide Swelling 11/28/2017   Pork-derived products Swelling 12/04/2017    Review of Systems:    All systems reviewed and negative except where noted in HPI.   Observations/Objective:  Labs: CMP     Component Value Date/Time   NA 139 09/27/2018 0641   K 3.7 09/27/2018 0641   CL 101 09/27/2018 0641   CO2 29 09/27/2018 0641   GLUCOSE 96 09/27/2018 0641   BUN 6 (L) 09/27/2018 0641   CREATININE 0.46 09/27/2018 0641   CALCIUM 9.0 09/27/2018 0641   PROT 6.3 (L) 09/27/2018 0641   ALBUMIN 3.5 09/27/2018 0641   AST 25 09/27/2018 0641   ALT 19 09/27/2018 0641   ALKPHOS 81 09/27/2018 0641   BILITOT 0.8 09/27/2018 0641   GFRNONAA >60 09/27/2018 0641   GFRAA >60 09/27/2018 0641   Lab Results  Component Value Date   WBC 6.6 09/27/2018   HGB 12.5 09/27/2018   HCT 38.0 09/27/2018   MCV  92.2 09/27/2018   PLT 392 09/27/2018    Imaging Studies: No results found.  Assessment and Plan:   Sherry [name] is a 84 y.o. y/o femalehere today to follow-upfor constipation which in turn probably related to spinal cord injury.  She has failed or rather not adequately responded to pretty much all laxatives on its own or in combination.  Overall she does feel better but not where she would like to be.  Discussed only options are to perform a colonoscopy to rule out any luminal obstruction and if none present then to consider an ileostomy as a more suitable long-term option.  Daughter and patient discussed and decided that they would like to think about all their options and they were concerned about COVID-19 at this time and would not like to want to come to the hospital for any procedures.  They will call me when they are ready.   I provided  14 minutes of non-face-to-face time during this encounter.  Dr [name] MD,MRCP [facility]) Gastroenterology/Hepatology Pager: [phone]   Speech recognition software was used to dictate this note.

## 2019-03-22 ENCOUNTER — Encounter
Admission: RE | Admit: 2019-03-22 | Discharge: 2019-03-22 | Disposition: A | Discharge status: Home / Self Care | Payer: Medicare HMO | Source: Ambulatory Visit | Attending: Internal Medicine | Admitting: Internal Medicine

## 2019-05-17 DIAGNOSIS — I82502 Chronic embolism and thrombosis of unspecified deep veins of left lower extremity: Secondary | ICD-10-CM

## 2019-05-17 DIAGNOSIS — N319 Neuromuscular dysfunction of bladder, unspecified: Secondary | ICD-10-CM

## 2019-05-17 DIAGNOSIS — F39 Unspecified mood [affective] disorder: Secondary | ICD-10-CM

## 2019-05-17 DIAGNOSIS — I10 Essential (primary) hypertension: Secondary | ICD-10-CM

## 2019-05-17 DIAGNOSIS — K592 Neurogenic bowel, not elsewhere classified: Secondary | ICD-10-CM

## 2019-05-17 DIAGNOSIS — M159 Polyosteoarthritis, unspecified: Secondary | ICD-10-CM

## 2019-05-17 DIAGNOSIS — G8220 Paraplegia, unspecified: Secondary | ICD-10-CM

## 2019-06-04 ENCOUNTER — Telehealth: Payer: Self-pay | Admitting: Internal Medicine

## 2019-06-04 NOTE — Telephone Encounter (Signed)
Patient's daughter Sherry [name] today in regards to the patient  She is a patient at [facility] .   [name] wanted to know if you will be able to see the patient this week when you make rounds at [facility].  [name] is concerned because the patient has developed a rash that is very itchy.  She stated that they are not sure what is causing this, all of the patient's detergent,lotions, deodorant etc is fragrance free to avoid this.  She also stated that you have been working with the patient adjusting her medications and decreased the xarelto, They have noticed that the patient legs have some swelling.    [place] [name](daughter) - [phone]

## 2019-06-04 NOTE — Telephone Encounter (Signed)
Discussed her concerns I will plan to check her mom tomorrow

## 2019-06-05 DIAGNOSIS — L309 Dermatitis, unspecified: Secondary | ICD-10-CM | POA: Diagnosis not present

## 2019-06-14 DIAGNOSIS — I10 Essential (primary) hypertension: Secondary | ICD-10-CM | POA: Diagnosis not present

## 2019-06-14 DIAGNOSIS — F39 Unspecified mood [affective] disorder: Secondary | ICD-10-CM

## 2019-06-14 DIAGNOSIS — G8220 Paraplegia, unspecified: Secondary | ICD-10-CM | POA: Diagnosis not present

## 2019-06-14 DIAGNOSIS — M159 Polyosteoarthritis, unspecified: Secondary | ICD-10-CM

## 2019-06-14 DIAGNOSIS — I82891 Chronic embolism and thrombosis of other specified veins: Secondary | ICD-10-CM | POA: Diagnosis not present

## 2019-06-14 DIAGNOSIS — K592 Neurogenic bowel, not elsewhere classified: Secondary | ICD-10-CM

## 2019-06-25 ENCOUNTER — Telehealth: Payer: Self-pay

## 2019-06-25 NOTE — Telephone Encounter (Signed)
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 I don't think she needs to have a colonoscopy--but I can discuss it directly with her if desired (she should let Evelena Peat the nurse know)

## 2019-06-25 NOTE — Telephone Encounter (Signed)
[name] (pts daughter) pt is a resident at [facility] said pt had been requested to have a colonoscopy; at age 85 pt has never had a colonoscopy and wants to know from Dr Silvio Pate what they should do and what is the right process. [name] request cb.

## 2019-06-25 NOTE — Telephone Encounter (Signed)
Contacted pt's daughter and advised. She reports pt has seen GI and she does want to go ahead with the colonoscopy. She also said pt does not want to go to [facility] for the colonoscopy. She verbalized understanding.

## 2019-07-03 DIAGNOSIS — F39 Unspecified mood [affective] disorder: Secondary | ICD-10-CM

## 2019-07-16 ENCOUNTER — Other Ambulatory Visit: Payer: Self-pay

## 2019-07-16 ENCOUNTER — Ambulatory Visit: Payer: Medicare HMO | Admitting: Gastroenterology

## 2019-07-16 VITALS — BP 122/76 | HR 75 | Temp 98.0°F | Ht 67.0 in

## 2019-07-16 DIAGNOSIS — K5900 Constipation, unspecified: Secondary | ICD-10-CM | POA: Diagnosis not present

## 2019-07-16 DIAGNOSIS — K592 Neurogenic bowel, not elsewhere classified: Secondary | ICD-10-CM

## 2019-07-16 NOTE — Progress Notes (Signed)
[name] MD, MRCP(U.K) [address]  [place]  [place], [place] [address]  Main: [phone]  Fax: [phone]   Primary Care Physician: Glendon Axe, MD  Primary Gastroenterologist:  Dr. [name]   Chief Complaint  Patient presents with  . Bloated    HPI: Sherry [name] is a 85 y.o. female    Summary of history :  Shewas initially referred and seen on 7/7/2020referred for abdominal distension and abdominal pain. H/o central cord synrome C 6 level, on lactulose and senna for constipation.  All began end of April - developed appendicitis. Treated with IV antibiotics . Felt by Dr Peyton Najjar that the appendicitis was caused by the constipation. She says that the constipationongoingfor a while - when she moved to a different place the bowel regime was changed and then things changed.   Ct abdomen 06/2018 Large colorectal stool burden with fecal impaction. Periappendiceal phlegmon decreased in size. Bladder wall thickening . Cystitis not excluded. H/o acute appendicitis . UA on 07/10/2018 shows leucocyts and positive for nitrites and bacteria. Multiple bacteria seen on culture. Cervical cord injury in 11/2017 from a fall.   In November 2020 I discussed with the patient and her family member that we have tried all agents that we have at our disposal in terms of medications which have not worked even in combination.  Previously I suggested a colonoscopy and then consider an ileostomy as well.   Interval history 12/26/2018-07/16/2019  Today here with her daughter.  She denies any constipation states has a bowel movement daily but has issues with significant abdominal bloating.  On Trulance, lactulose.  Not any charcoal tablets.  She does take gabapentin.  Denies any bleeding.    Current Outpatient Medications  Medication Sig Dispense Refill  . acetaminophen (TYLENOL) 325 MG tablet Take 650 mg by mouth every 6 (six) hours.     . Alpha-D-Galactosidase (BEANO)  TABS Take 150 mg by mouth 3 (three) times daily with meals as needed (Bloating). Give 2 tabs = 300 mg    . alum & mag hydroxide-simeth (MAALOX PLUS) 400-400-40 MG/5ML suspension Take 30 mLs by mouth every 4 (four) hours as needed for indigestion.    Marland Kitchen amLODipine (NORVASC) 10 MG tablet Take 10 mg by mouth daily after supper.     . bisacodyl (DULCOLAX) 10 MG suppository Place 10 mg rectally daily as needed for moderate constipation.    . calcium carbonate (TUMS EX) 750 MG chewable tablet Chew 1 tablet by mouth 3 (three) times daily.    . Capsaicin 0.1 % CREA Apply 1 application topically 2 (two) times daily. Apply to left shoulder, bilateral hands, and knees    . DULoxetine (CYMBALTA) 60 MG capsule Take 60 mg by mouth daily.     Marland Kitchen gabapentin (NEURONTIN) 400 MG capsule Take 400 mg by mouth every 6 (six) hours.    Marland Kitchen lactulose (CHRONULAC) 10 GM/15ML solution Take 30 g by mouth daily.    Marland Kitchen loratadine (CLARITIN) 10 MG tablet Take 10 mg by mouth daily.     . magnesium hydroxide (MILK OF MAGNESIA) 400 MG/5ML suspension Take 30 mLs by mouth every 4 (four) hours as needed for mild constipation.    . metoCLOPramide (REGLAN) 5 MG tablet Take 1 tablet (5 mg total) by mouth every 8 (eight) hours as needed for nausea. (Patient not taking: Reported on 11/06/2018) 20 tablet 1  . Multiple Vitamin (MULTIVITAMIN WITH MINERALS) TABS tablet Take 1 tablet by mouth daily.    Marland Kitchen  NON FORMULARY Diet Type:  Regular - NO BEEF, PORK OR DAIRY PRODUCTS    . nortriptyline (PAMELOR) 25 MG capsule Take 25 mg by mouth at bedtime.    Oneta Rack Supplies (SKIN PREP WIPES) MISC Apply to both heals daily    . oxyCODONE (OXY IR/ROXICODONE) 5 MG immediate release tablet Take 0.5 tablets (2.5 mg total) by mouth every 12 hours as needed for severe pain. 14 tablet 0  . Probiotic Product (RISA-BID PROBIOTIC) TABS Take 1 tablet by mouth daily.     . rivaroxaban (XARELTO) 20 MG TABS tablet Take 1 tablet (20 mg total) by mouth daily with supper. 30  tablet   . sennosides-docusate sodium (SENOKOT-S) 8.6-50 MG tablet Take 2 tablets by mouth 2 (two) times a day.    . Skin Protectants, Misc. (ENDIT EX) Apply topically to bottom / peri-area after preforming peri-care    . sodium phosphate (FLEET) 7-19 GM/118ML ENEM Place 1 enema rectally daily as needed for severe constipation.    Marland Kitchen spironolactone (ALDACTONE) 25 MG tablet Take 25 mg by mouth 2 (two) times daily.     . tizanidine (ZANAFLEX) 2 MG capsule Take 2 mg by mouth every 8 (eight) hours.      No current facility-administered medications for this visit.    Allergies as of 07/16/2019 - Review Complete 07/16/2019  Allergen Reaction Noted  . Other Swelling 11/25/2016  . Lisinopril-hydrochlorothiazide Swelling 12/23/2015  . Beef-derived products Swelling 11/28/2017  . Dairy aid [lactase] Swelling 12/04/2017  . Lisinopril-hydrochlorothiazide Swelling 11/28/2017  . Pork-derived products Swelling 12/04/2017    ROS:  General: Negative for anorexia, weight loss, fever, chills, fatigue, weakness. ENT: Negative for hoarseness, difficulty swallowing , nasal congestion. CV: Negative for chest pain, angina, palpitations, dyspnea on exertion, peripheral edema.  Respiratory: Negative for dyspnea at rest, dyspnea on exertion, cough, sputum, wheezing.  GI: See history of present illness. GU:  Negative for dysuria, hematuria, urinary incontinence, urinary frequency, nocturnal urination.  Endo: Negative for unusual weight change.    Physical Examination:   BP 122/76   Pulse 75   Temp 98 F (36.7 C)   Ht 5\' 7"  (1.702 m)   BMI 25.06 kg/m   General: Well-nourished, well-developed , sitting in a wheelchair comfortable. Eyes: No icterus. Conjunctivae pink. Mouth: Oropharyngeal mucosa moist and pink , no lesions erythema or exudate. Lungs: Clear to auscultation bilaterally. Non-labored. Heart: Regular rate and rhythm, no murmurs rubs or gallops.  Abdomen: Bowel sounds are normal, nontender,  nondistended, no hepatosplenomegaly or masses, no abdominal bruits or hernia , no rebound or guarding.   Neuro: Alert and oriented x 3. Skin: Warm and dry, no jaundice.   Psych: Alert and cooperative, normal mood and affect.   Imaging Studies: No results found.  Assessment and Plan:   Sherry [name] is a 85 y.o. y/o female here today to follow-upfor constipation which in turn probably related to spinal cord injury.  She has failed or rather not adequately responded to pretty much all laxatives on its own or in combination.  She has never had a colonoscopy by performing it would be hard as she is not mobile and risk of anesthesia would be higher with her spinal cord injury.  Long discussion with patient and daughter that her abdominal bloating may be decreasing through the lactulose.  At times could also be from constipation.  I discussed various options that are basically balancing the quantity of lactulose as a significant side effect of lactulose is bloating.  Could consider dropping the dose but there is a possibility that the constipation would be worse.  If possible drop the dose and see if the bloating can be decreased with no change in her bowel movements.  If is not possible continue with the present dose of lactulose and may have to add charcoal tablets.  In addition try a low FODMAP diet  Again I discussed long-term that if all the symptoms are not better controlled and if it is affecting quality of life that may be worth meeting with the surgeon at [facility] to consider an ileostomy as it would improve her quality of life.   Dr [name]  MD,MRCP [facility]) Follow up in as needed

## 2019-07-18 DIAGNOSIS — Z91018 Allergy to other foods: Secondary | ICD-10-CM | POA: Diagnosis not present

## 2019-07-18 DIAGNOSIS — K592 Neurogenic bowel, not elsewhere classified: Secondary | ICD-10-CM

## 2019-07-18 DIAGNOSIS — I10 Essential (primary) hypertension: Secondary | ICD-10-CM

## 2019-07-18 DIAGNOSIS — M1990 Unspecified osteoarthritis, unspecified site: Secondary | ICD-10-CM | POA: Diagnosis not present

## 2019-07-18 DIAGNOSIS — F39 Unspecified mood [affective] disorder: Secondary | ICD-10-CM | POA: Diagnosis not present

## 2019-07-18 DIAGNOSIS — I8291 Chronic embolism and thrombosis of unspecified vein: Secondary | ICD-10-CM

## 2019-07-18 DIAGNOSIS — G8220 Paraplegia, unspecified: Secondary | ICD-10-CM

## 2019-07-25 ENCOUNTER — Telehealth: Payer: Self-pay | Admitting: General Practice

## 2019-07-25 DIAGNOSIS — S14126S Central cord syndrome at C6 level of cervical spinal cord, sequela: Secondary | ICD-10-CM

## 2019-07-25 NOTE — Telephone Encounter (Signed)
Left message on verified VM for Sherry [name]. Changed PCP to Dr Silvio Pate.

## 2019-07-25 NOTE — Telephone Encounter (Signed)
Please let the daughter know that I sent in the referral but I am not sure how long it will take to get an appointment. She should check with [facility] about transportation.  Please change me to PCP

## 2019-07-25 NOTE — Telephone Encounter (Signed)
Patient's Daughter Sherry [name], they are trying to get the patient set up for physical Therapy. She stated the patient is a resident of [facility] and see Dr Silvio Pate there.  [name] has been speaking with the PT director at [facility] and because the patient has a spinal cord injury they stated they are not qualified to do PT for the patient.  [name] stated she has called around and spoke with someone at the [facility] facility and in order for them to do PT the patient will need a referral sent to them  [name] wanted to know if you could send this.

## 2019-08-07 DIAGNOSIS — I872 Venous insufficiency (chronic) (peripheral): Secondary | ICD-10-CM

## 2019-08-21 ENCOUNTER — Ambulatory Visit: Payer: Medicare HMO | Attending: Internal Medicine

## 2019-08-21 ENCOUNTER — Encounter: Payer: Self-pay | Admitting: Physical Therapy

## 2019-08-21 ENCOUNTER — Other Ambulatory Visit: Payer: Self-pay

## 2019-08-21 DIAGNOSIS — R2689 Other abnormalities of gait and mobility: Secondary | ICD-10-CM

## 2019-08-21 DIAGNOSIS — R140 Abdominal distension (gaseous): Secondary | ICD-10-CM | POA: Diagnosis present

## 2019-08-21 DIAGNOSIS — S14129S Central cord syndrome at unspecified level of cervical spinal cord, sequela: Secondary | ICD-10-CM

## 2019-08-21 DIAGNOSIS — M6281 Muscle weakness (generalized): Secondary | ICD-10-CM | POA: Insufficient documentation

## 2019-08-21 NOTE — Therapy (Signed)
[place] MAIN [facility] SERVICES [address] [place], [place], [address] Phone: [phone]   Fax:  [phone]  Physical Therapy Evaluation  Patient Details  Name: Sherry [name] MRN: [id number] Date of Birth: [birth year] Referring Provider (PT): Viviana Simpler   Encounter Date: 08/21/2019   PT End of Session - 08/21/19 1257    Visit Number 1    Number of Visits 16    Date for PT Re-Evaluation 10/16/19    Authorization Type aetna medicare FOTO performed by PT on eval (7/20), score 12    PT Start Time 1300    PT Stop Time 1400    PT Time Calculation (min) 60 min    Activity Tolerance Patient tolerated treatment well    Behavior During Therapy [facility] for tasks assessed/performed           Past Medical History:  Diagnosis Date   Acute blood loss anemia    Arthritis    Cancer ([place])    skin   Central cord syndrome at C6 level of cervical spinal cord ([place]) 11/29/2017   Hypertension    Protein-calorie malnutrition, severe ([place]) 01/24/2018   S/P insertion of IVC (inferior vena caval) filter 01/24/2018   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([place])     Past Surgical History:  Procedure Laterality Date   ANTERIOR CERVICAL DECOMP/DISCECTOMY FUSION N/A 11/29/2017   Procedure: Cervical five-six, six-seven Anterior Cervical Decompression Fusion;  Surgeon: Judith Part, MD;  Location: [employer];  Service: Neurosurgery;  Laterality: N/A;  Cervical five-six, six-seven Anterior Cervical Decompression Fusion   CATARACT EXTRACTION     IR IVC FILTER PLMT / S&I /IMG GUID/MOD SED  12/08/2017    There were no vitals filed for this visit.      OBJECTIVE  MUSCULOSKELETAL: Tremor: Absent Bulk: Normal Tone: mostly WFLs, however pt unable to close bilateral hands, overflow tone noted in LLE with UE/trunk exertion  Posture Pt with slouched position in sitting, unsupported sitting pt with R shortened trunk, elongated on L (may benefit from bolster underneath  L hip to help achieve midline)  Gait Not tested  Transfers Per pt, at baseline requires hoyer lift. Up until about a little over a month ago, pt stated she was working on stand pivot transfers with 1-2 person assist. Rolling to be assessed next session  Strength R/L LE 2+/3- Hip flexion 5/5 Hip extension (to be tested next session, pt able to perform glute squeeze bilaterally) 2+/3- Hip abduction 3/3+ Hip adduction 2+/3+ Knee extension 1/2- Knee flexion 2+/0 Ankle Plantarflexion 2+/needs to be measured Ankle Dorsiflexion  R/L UE 2+/2+ Shoulder flexion 3+/3+Shoulder depression 4+/4+ Elbow flexion 0/1 Elbow extension 2-/2- Shoulder extension (posterior delt) Pt unable to close hands bilaterally   NEUROLOGICAL:  Mental Status Patient is oriented to person, place and time.  Recent memory is intact.  Remote memory is intact.  Attention span and concentration are intact.  Expressive speech is intact.  Patient's fund of knowledge is within normal limits for educational level.  Cranial Nerves Visual acuity and visual fields are intact   Wears glasses Extraocular muscles are intact  Facial sensation is intact bilaterally  Facial strength is intact bilaterally  Hearing is normal as tested by gross conversation Palate elevates midline, normal phonation  Shoulder shrug strength is intact  Tongue protrudes midline   Sensation Pt reported decreased light touch sensation of UE and LEs grossly, C3 testing WFLs   Dynamic Sitting Balance  Normal Able to sit unsupported and weight shift across  midline maximally   Good Able to sit unsupported and weight shift across midline moderately   Good-/Fair+ Able to sit unsupported and weight shift across midline minimally   Fair Minimal weight shifting ipsilateral/front, difficulty crossing midline   Fair- Reach to ipsilateral side and unable to weight shift   Poor + Able to sit unsupported with min A and reach to ipsilateral side,  unable to weight shift   Poor Able to sit unsupported with mod A and reach ipsilateral/front-cant cross midline     Static Sitting Balance  Normal Able to maintain balance against maximal resistance   Good Able to maintain balance against moderate resistance   Good-/Fair+ Accepts minimal resistance   Fair Able to sit unsupported without balance loss and without UE support   Poor+ Able to maintain with Minimal assistance from individual or chair   Poor Unable to maintain balance-requires mod/max support from individual or chair     Objective measurements completed on examination: See above findings.   Treatment: AAROM of bilateral LE, LAQ x10, hip abduction, hip adduction in seated position Seated reaching unsupported in WC to midline/crossing midline x10 BUE.  Seated balance with 30-45sec holds for HEP. Pt verbalized understanding, personal aid to assist as needed.       PT Education - 08/21/19 1512    Education Details POC, HEP, goals of PT    Person(s) Educated Patient;Child(ren)    Methods Explanation;Demonstration    Comprehension Verbalized understanding;Returned demonstration            PT Short Term Goals - 08/21/19 1515      PT SHORT TERM GOAL #1   Title The patient will perform initial HEP with minimum assistance in order to improve strength and function.    Time 4    Period Weeks    Status New    Target Date 09/18/19             PT Long Term Goals - 08/21/19 1515      PT LONG TERM GOAL #1   Title The patient will be compliant with finalized HEP with minimum assistance in preparation for self management and maintenance of condition.    Time 8    Period Weeks    Status New    Target Date 10/16/19      PT LONG TERM GOAL #2   Title The patient will demonstrate at least 10 point improvement on FOTO score indicating an improved ability to perform functional activities.    Baseline on eval (/720) score 12    Time 8    Period Weeks    Status New     Target Date 10/16/19      PT LONG TERM GOAL #3   Title The patient will demonstrate lateral scooting  for assistance with transfers with minA to maximize independence.    Time 8    Period Weeks    Status New    Target Date 10/16/19      PT LONG TERM GOAL #4   Title The patient will demonstrate a squat pivot transfer with minimum assistance to maximize independence and mobility.    Time 8    Period Weeks    Status New    Target Date 10/16/19      PT LONG TERM GOAL #5   Title The patient will demonstrate sitting without UE support for 2-5 minutes at EOB to improve participation and maximize independence with ADLs.    Time 8    Period  Weeks    Status New    Target Date 10/16/19                  Plan - 08/21/19 1512    Clinical Impression Statement The patient is an 85 yo female that presented to physical therapy to address limitations in functional mobility and to maximize independence. The patient demonstrated limitations in strength, endurance, sitting balance, inability to ambulate or stand. Pt reported her main goal is to be able to get up and walk. These limitations impact the patient's independence and functional activities and ADLs. The patient would benefit from skilled PT intervention to maximize function and to improve safety and independence.    Personal Factors and Comorbidities Age;Time since onset of injury/illness/exacerbation;Comorbidity 3+;Fitness    Comorbidities HTN, quadriparesis, history of DVT, neurogenic bladder    Examination-Activity Limitations Bathing;Hygiene/Grooming;Squat;Bed Mobility;Lift;Bend;Stand;Engineer, manufacturing;Toileting;Self Feeding;Transfers;Continence;Sit;Dressing;Sleep;Carry    Examination-Participation Restrictions Church;Laundry;Cleaning;Medication Management;Community Activity;Meal Prep;Interpersonal Relationship    Stability/Clinical Decision Making Evolving/Moderate complexity    Clinical Decision Making Moderate     Rehab Potential Fair    PT Frequency 2x / week    PT Duration 8 weeks    PT Treatment/Interventions ADLs/Self Care Home Management;Electrical Stimulation;Therapeutic activities;Wheelchair mobility training;Vasopneumatic Device;Joint Manipulations;Vestibular;Passive range of motion;Patient/family education;Therapeutic exercise;DME Instruction;Biofeedback;Aquatic Therapy;Moist Heat;Gait training;Balance training;Orthotic Fit/Training;Dry needling;Energy conservation;Taping;Splinting;Neuromuscular re-education;Cryotherapy;Ultrasound;Functional mobility training    PT Next Visit Plan establish HEP, hoyer lift to mat, assess rolling, work on sitting balance    PT Home Exercise Plan sitting balance unsupported hold 30-45sec with aide    Consulted and Agree with Plan of Care Patient;Family member/caregiver    Family Member Consulted daughter [name]           Patient will benefit from skilled therapeutic intervention in order to improve the following deficits and impairments:  Abnormal gait, Decreased balance, Decreased endurance, Decreased mobility, Difficulty walking, Hypomobility, Impaired sensation, Decreased range of motion, Improper body mechanics, Impaired perceived functional ability, Decreased activity tolerance, Decreased knowledge of use of DME, Decreased safety awareness, Decreased strength, Impaired flexibility, Impaired UE functional use, Postural dysfunction  Visit Diagnosis: Muscle weakness (generalized)  Central cord syndrome, sequela (HCC)  Other abnormalities of gait and mobility     Problem List Patient Active Problem List   Diagnosis Date Noted   Acute appendicitis with appendiceal abscess 05/30/2018   Hypocalcemia 02/15/2018   Dysuria 02/15/2018   Bilateral lower extremity edema 02/11/2018   Vaginal yeast infection 01/30/2018   Chest tightness 01/27/2018   Healthcare-associated pneumonia 01/25/2018   Pleural effusion, not elsewhere classified 01/25/2018    Acute deep vein thrombosis (DVT) of left lower extremity ([place]) 01/24/2018   Acute deep vein thrombosis (DVT) of right lower extremity ([place]) 01/24/2018   Chronic allergic rhinitis 01/24/2018   Depression with anxiety 01/24/2018   UTI due to Klebsiella species 01/24/2018   Protein-calorie malnutrition, severe ([place]) 01/24/2018   S/P insertion of IVC (inferior vena caval) filter 01/24/2018   Reactive depression    Hypokalemia    Leukocytosis    Essential hypertension    Trauma    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(HCC)    Neuropathic pain    Neurogenic bowel    Neurogenic bladder    Benign essential HTN    Acute blood loss anemia    Central cord syndrome at C6 level of cervical spinal cord ([place]) 11/29/2017   Central cord syndrome ([place]) 11/29/2017    Lieutenant Diego PT, DPT 8:35 AM,08/22/19    [place] MAIN [facility] SERVICES [address]  [place], [place], [address] Phone: [phone]   Fax:  [phone]  Name: Sherry [name] MRN: [id number] Date of Birth: Jun 04, 1934

## 2019-08-23 ENCOUNTER — Ambulatory Visit: Payer: Medicare HMO

## 2019-08-23 ENCOUNTER — Other Ambulatory Visit: Payer: Self-pay

## 2019-08-23 ENCOUNTER — Encounter: Payer: Self-pay | Admitting: Physical Therapy

## 2019-08-23 DIAGNOSIS — R2689 Other abnormalities of gait and mobility: Secondary | ICD-10-CM

## 2019-08-23 DIAGNOSIS — M6281 Muscle weakness (generalized): Secondary | ICD-10-CM

## 2019-08-23 DIAGNOSIS — S14129S Central cord syndrome at unspecified level of cervical spinal cord, sequela: Secondary | ICD-10-CM

## 2019-08-23 NOTE — Therapy (Signed)
[place] MAIN [facility] SERVICES [address] [place], [place], [address] Phone: [phone]   Fax:  [phone]  Physical Therapy Treatment  Patient Details  Name: Sherry [name] MRN: [id number] Date of Birth: 06/05/1934 Referring Provider (PT): Viviana Simpler   Encounter Date: 08/23/2019   PT End of Session - 08/23/19 1609    Visit Number 2    Number of Visits 16    Date for PT Re-Evaluation 10/16/19    Authorization Type aetna medicare FOTO performed by PT on eval (7/20), score 12    PT Start Time 1430    PT Stop Time 1515    PT Time Calculation (min) 45 min    Equipment Utilized During Treatment Gait belt;Other (comment)   hoyer lift   Activity Tolerance Patient tolerated treatment well    Behavior During Therapy WFL for tasks assessed/performed           Past Medical History:  Diagnosis Date  . Acute blood loss anemia   . Arthritis   . Cancer ([place])    skin  . Central cord syndrome at C6 level of cervical spinal cord ([place]) 11/29/2017  . Hypertension   . Protein-calorie malnutrition, severe ([place]) 01/24/2018  . S/P insertion of IVC (inferior vena caval) filter 01/24/2018  . 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[facility])     Past Surgical History:  Procedure Laterality Date  . ANTERIOR CERVICAL DECOMP/DISCECTOMY FUSION N/A 11/29/2017   Procedure: Cervical five-six, six-seven Anterior Cervical Decompression Fusion;  Surgeon: Judith Part, MD;  Location: [place];  Service: Neurosurgery;  Laterality: N/A;  Cervical five-six, six-seven Anterior Cervical Decompression Fusion  . CATARACT EXTRACTION    . IR IVC FILTER PLMT / S&I /IMG GUID/MOD SED  12/08/2017    There were no vitals filed for this visit.   Subjective Assessment - 08/23/19 1607    Subjective Patient stated that she is doing well today, performed HEP    Patient is accompained by: --   aide   Pertinent History Pt is an 85 yo female that fell in 2019, fractured vertebrae in her neck and in  her low back per family. 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 After hospital stay, pt discharged to [facility] for ~1 month, experienced severe UTI as well as bilateral DVT (IVC filter placed). Discharged to [facility], stayed for about a year, and then moved to [facility] in April 2021. Was receiving PT, but per family facility reported that did not have adequate equipment to maximize PT for her. Pt until about 1 month ago was practicing sit to stand transfers with 1-2 people, and working on static standing. Has a brace for L foot due to PF. Pt currently needs assistance with all ADLs (able to assist with donning/doffing her shirt), bed baths, and needs a hoyer lift for transfers. Able to drive power wheelchair without assistance.    Limitations Standing;Walking;House hold activities;Lifting    How long can you sit comfortably? NA    How long can you stand comfortably? unable    How long can you walk comfortably? unable    Patient Stated Goals "to stand up and walk"    Currently in Pain? No/denies           TREATMENT:    Pt transferred to mat with hoyer lift   Ther-ex  Supine hip abduction bent knee AROM, resisted on LLE, AROM on RLE x15 Supine hip adduction  Bent knee AROM x15 BLE Hooklying SAQ over bolster,  LLE 2# AW, no AW and AAROM on RLE x15 ea Hooklying bridges with bolster under knees 3s hold x 15; Practiced rolling patterns with PT provided LE positioning oblique crunch with therapist assisting roll in each direction x 4 toward each side;  Seated balance with therapist providing perturbations in multiple directions for challenge x multiple bouts each direction Seated balance with anterior/posterior lean performed by pt initiating movement and self correcting, cga-minA for safety Seated balance with reaching outside BOS x10 BUE  Two attempts at clearing buttocks from EOB with PT set up in squat pivot technique. Pt able to clear buttocks each attempt. modA  Pt  response/clinical impression: Pt presented to therapy with excellent motivation. Pt fatigued in sitting EOB but able to maintain ~15 minutes with one rest break. Pt able to reach inside and outside BOS with CGAx2. Pt very eager to transition to standing if able, pt able to clear buttocks from table with assist of modAx1. Patient may benefit from further attempts with RW and platform attachments bilaterally due to limitations in grip. Pt also have L foot brace to address PF, instructed to come wearing it therapy next session.      PT Education - 08/23/19 1608    Education Details therex    Person(s) Educated Patient;Caregiver(s)    Methods Explanation;Demonstration;Tactile cues;Verbal cues    Comprehension Verbalized understanding;Returned demonstration            PT Short Term Goals - 08/21/19 1515      PT SHORT TERM GOAL #1   Title The patient will perform initial HEP with minimum assistance in order to improve strength and function.    Time 4    Period Weeks    Status New    Target Date 09/18/19             PT Long Term Goals - 08/21/19 1515      PT LONG TERM GOAL #1   Title The patient will be compliant with finalized HEP with minimum assistance in preparation for self management and maintenance of condition.    Time 8    Period Weeks    Status New    Target Date 10/16/19      PT LONG TERM GOAL #2   Title The patient will demonstrate at least 10 point improvement on FOTO score indicating an improved ability to perform functional activities.    Baseline on eval (/720) score 12    Time 8    Period Weeks    Status New    Target Date 10/16/19      PT LONG TERM GOAL #3   Title The patient will demonstrate lateral scooting  for assistance with transfers with minA to maximize independence.    Time 8    Period Weeks    Status New    Target Date 10/16/19      PT LONG TERM GOAL #4   Title The patient will demonstrate a squat pivot transfer with minimum assistance to  maximize independence and mobility.    Time 8    Period Weeks    Status New    Target Date 10/16/19      PT LONG TERM GOAL #5   Title The patient will demonstrate sitting without UE support for 2-5 minutes at EOB to improve participation and maximize independence with ADLs.    Time 8    Period Weeks    Status New    Target Date 10/16/19  Plan - 08/23/19 1608    Clinical Impression Statement Pt presented to therapy with excellent motivation. Pt fatigued in sitting EOB but able to maintain ~15 minutes with one rest break. Pt able to reach inside and outside BOS with CGAx2. Pt very eager to transition to standing if able, pt able to clear buttocks from table with assist of modAx1. Patient may benefit from further attempts with RW and platform attachments bilaterally due to limitations in grip. Pt also have L foot brace to address PF, instructed to come wearing it therapy next session.    Personal Factors and Comorbidities Age;Time since onset of injury/illness/exacerbation;Comorbidity 3+;Fitness    Comorbidities HTN, quadriparesis, history of DVT, neurogenic bladder    Examination-Activity Limitations Bathing;Hygiene/Grooming;Squat;Bed Mobility;Lift;Bend;Stand;Engineer, manufacturing;Toileting;Self Feeding;Transfers;Continence;Sit;Dressing;Sleep;Carry    Examination-Participation Restrictions Church;Laundry;Cleaning;Medication Management;Community Activity;Meal Prep;Interpersonal Relationship    Stability/Clinical Decision Making Evolving/Moderate complexity    Rehab Potential Fair    PT Frequency 2x / week    PT Duration 8 weeks    PT Treatment/Interventions ADLs/Self Care Home Management;Electrical Stimulation;Therapeutic activities;Wheelchair mobility training;Vasopneumatic Device;Joint Manipulations;Vestibular;Passive range of motion;Patient/family education;Therapeutic exercise;DME Instruction;Biofeedback;Aquatic Therapy;Moist Heat;Gait training;Balance  training;Orthotic Fit/Training;Dry needling;Energy conservation;Taping;Splinting;Neuromuscular re-education;Cryotherapy;Ultrasound;Functional mobility training    PT Next Visit Plan platform attachments for RW to assess standing? transfers, sitting balance/endurance    PT Home Exercise Plan sitting balance unsupported hold 30-45sec with aide    Consulted and Agree with Plan of Care Patient           Patient will benefit from skilled therapeutic intervention in order to improve the following deficits and impairments:  Abnormal gait, Decreased balance, Decreased endurance, Decreased mobility, Difficulty walking, Hypomobility, Impaired sensation, Decreased range of motion, Improper body mechanics, Impaired perceived functional ability, Decreased activity tolerance, Decreased knowledge of use of DME, Decreased safety awareness, Decreased strength, Impaired flexibility, Impaired UE functional use, Postural dysfunction  Visit Diagnosis: Muscle weakness (generalized)  Central cord syndrome, sequela (HCC)  Other abnormalities of gait and mobility     Problem List Patient Active Problem List   Diagnosis Date Noted  . Acute appendicitis with appendiceal abscess 05/30/2018  . Hypocalcemia 02/15/2018  . Dysuria 02/15/2018  . Bilateral lower extremity edema 02/11/2018  . Vaginal yeast infection 01/30/2018  . Chest tightness 01/27/2018  . Healthcare-associated pneumonia 01/25/2018  . Pleural effusion, not elsewhere classified 01/25/2018  . Acute deep vein thrombosis (DVT) of left lower extremity ([place]) 01/24/2018  . Acute deep vein thrombosis (DVT) of right lower extremity ([place]) 01/24/2018  . Chronic allergic rhinitis 01/24/2018  . Depression with anxiety 01/24/2018  . UTI due to Klebsiella species 01/24/2018  . Protein-calorie malnutrition, severe ([place]) 01/24/2018  . S/P insertion of IVC (inferior vena caval) filter 01/24/2018  . Reactive depression   . Hypokalemia   . Leukocytosis   .  Essential hypertension   . Trauma   . 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([place])   . Neuropathic pain   . Neurogenic bowel   . Neurogenic bladder   . Benign essential HTN   . Acute blood loss anemia   . Central cord syndrome at C6 level of cervical spinal cord ([place]) 11/29/2017  . Central cord syndrome [facility]) 11/29/2017    Lieutenant Diego PT, DPT 4:13 PM,08/23/19   [place] MAIN [facility] SERVICES [address] [place], [place], [address] Phone: [phone]   Fax:  [phone]  Name: Sherry [name] MRN: [id number] Date of Birth: 1934/12/25

## 2019-08-28 ENCOUNTER — Encounter: Payer: Self-pay | Admitting: Physical Therapy

## 2019-08-28 ENCOUNTER — Ambulatory Visit: Payer: Medicare HMO | Admitting: Physical Therapy

## 2019-08-28 DIAGNOSIS — S14129S Central cord syndrome at unspecified level of cervical spinal cord, sequela: Secondary | ICD-10-CM

## 2019-08-28 DIAGNOSIS — M6281 Muscle weakness (generalized): Secondary | ICD-10-CM

## 2019-08-28 DIAGNOSIS — R140 Abdominal distension (gaseous): Secondary | ICD-10-CM

## 2019-08-28 DIAGNOSIS — O2230 Deep phlebothrombosis in pregnancy, unspecified trimester: Secondary | ICD-10-CM

## 2019-08-28 DIAGNOSIS — R2689 Other abnormalities of gait and mobility: Secondary | ICD-10-CM

## 2019-08-28 NOTE — Therapy (Signed)
[place] MAIN [facility] SERVICES [address] [place], [place], [address] Phone: [phone]   Fax:  [phone]  Physical Therapy Treatment  Patient Details  Name: Sherry [name] MRN: [id number] Date of Birth: [birth year] Referring Provider (PT): Viviana Simpler   Encounter Date: 08/28/2019   PT End of Session - 08/28/19 1409    Visit Number 3    Number of Visits 16    Date for PT Re-Evaluation 10/16/19    Authorization Type aetna medicare FOTO performed by PT on eval (7/20), score 12    PT Start Time 1400    PT Stop Time 1440    PT Time Calculation (min) 40 min    Equipment Utilized During Treatment Gait belt;Other (comment)   hoyer lift   Activity Tolerance Patient tolerated treatment well    Behavior During Therapy WFL for tasks assessed/performed           Past Medical History:  Diagnosis Date  . Acute blood loss anemia   . Arthritis   . Cancer ([place])    skin  . Central cord syndrome at C6 level of cervical spinal cord ([place]) 11/29/2017  . Hypertension   . Protein-calorie malnutrition, severe ([place]) 01/24/2018  . S/P insertion of IVC (inferior vena caval) filter 01/24/2018  . 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[facility])     Past Surgical History:  Procedure Laterality Date  . ANTERIOR CERVICAL DECOMP/DISCECTOMY FUSION N/A 11/29/2017   Procedure: Cervical five-six, six-seven Anterior Cervical Decompression Fusion;  Surgeon: Judith Part, MD;  Location: [place];  Service: Neurosurgery;  Laterality: N/A;  Cervical five-six, six-seven Anterior Cervical Decompression Fusion  . CATARACT EXTRACTION    . IR IVC FILTER PLMT / S&I /IMG GUID/MOD SED  12/08/2017    There were no vitals filed for this visit.   Subjective Assessment - 08/28/19 1408    Subjective Patient stated that she is doing well today, performed HEP    Patient is accompained by: --   aide   Pertinent History Pt is an 85 yo female that fell in 2019, fractured vertebrae in her neck and in  her low back per family. 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 After hospital stay, pt discharged to CIR for ~1 month, experienced severe UTI as well as bilateral DVT (IVC filter placed). Discharged to [facility], stayed for about a year, and then moved to [facility] in April 2021. Was receiving PT, but per family facility reported that did not have adequate equipment to maximize PT for her. Pt until about 1 month ago was practicing sit to stand transfers with 1-2 people, and working on static standing. Has a brace for L foot due to PF. Pt currently needs assistance with all ADLs (able to assist with donning/doffing her shirt), bed baths, and needs a hoyer lift for transfers. Able to drive power wheelchair without assistance.    Limitations Standing;Walking;House hold activities;Lifting    How long can you sit comfortably? NA    How long can you stand comfortably? unable    How long can you walk comfortably? unable    Patient Stated Goals "to stand up and walk"    Currently in Pain? No/denies          Treatment: Hoyer lift from power wc <> mat Therapeutic exercise: Supine: Hookling marching x 15 AAROM LLE Bridging x 15 SAQ x 10 x 3 sets  BLE Hip abd/add x 15, BLE, using sliding board and AAROM Hip hooklying single hip  abd/add PROM B ankle DF stretch x 30 sec   Patient performed with instruction, verbal cues, tactile cues of therapist: goal: increase tissue extensibility, promote proper posture, improve mobility                             PT Education - 08/28/19 1409    Education Details HEP    Person(s) Educated Patient    Methods Explanation    Comprehension Verbal cues required;Tactile cues required;Need further instruction            PT Short Term Goals - 08/21/19 1515      PT SHORT TERM GOAL #1   Title The patient will perform initial HEP with minimum assistance in order to improve strength and function.    Time 4    Period  Weeks    Status New    Target Date 09/18/19             PT Long Term Goals - 08/21/19 1515      PT LONG TERM GOAL #1   Title The patient will be compliant with finalized HEP with minimum assistance in preparation for self management and maintenance of condition.    Time 8    Period Weeks    Status New    Target Date 10/16/19      PT LONG TERM GOAL #2   Title The patient will demonstrate at least 10 point improvement on FOTO score indicating an improved ability to perform functional activities.    Baseline on eval (/720) score 12    Time 8    Period Weeks    Status New    Target Date 10/16/19      PT LONG TERM GOAL #3   Title The patient will demonstrate lateral scooting  for assistance with transfers with minA to maximize independence.    Time 8    Period Weeks    Status New    Target Date 10/16/19      PT LONG TERM GOAL #4   Title The patient will demonstrate a squat pivot transfer with minimum assistance to maximize independence and mobility.    Time 8    Period Weeks    Status New    Target Date 10/16/19      PT LONG TERM GOAL #5   Title The patient will demonstrate sitting without UE support for 2-5 minutes at EOB to improve participation and maximize independence with ADLs.    Time 8    Period Weeks    Status New    Target Date 10/16/19                 Plan - 08/28/19 1410    Clinical Impression Statement Patient performs AAROM to BLE and PROM to B ankles. She needs a hoyer for transfers power WC <> mat. She has decreased sensation BLE and decreased strength BLE. She was recommended to get a L ankle brace for L ankle DF contracture and OT referral . She will continue to benefit from skilled PT to improve mobility and strength.    Personal Factors and Comorbidities Age;Time since onset of injury/illness/exacerbation;Comorbidity 3+;Fitness    Comorbidities HTN, quadriparesis, history of DVT, neurogenic bladder    Examination-Activity Limitations  Bathing;Hygiene/Grooming;Squat;Bed Mobility;Lift;Bend;Stand;Engineer, manufacturing;Toileting;Self Feeding;Transfers;Continence;Sit;Dressing;Sleep;Carry    Examination-Participation Restrictions Church;Laundry;Cleaning;Medication Management;Community Activity;Meal Prep;Interpersonal Relationship    Stability/Clinical Decision Making Evolving/Moderate complexity    Rehab Potential Fair    PT  Frequency 2x / week    PT Duration 8 weeks    PT Treatment/Interventions ADLs/Self Care Home Management;Electrical Stimulation;Therapeutic activities;Wheelchair mobility training;Vasopneumatic Device;Joint Manipulations;Vestibular;Passive range of motion;Patient/family education;Therapeutic exercise;DME Instruction;Biofeedback;Aquatic Therapy;Moist Heat;Gait training;Balance training;Orthotic Fit/Training;Dry needling;Energy conservation;Taping;Splinting;Neuromuscular re-education;Cryotherapy;Ultrasound;Functional mobility training    PT Next Visit Plan platform attachments for RW to assess standing? transfers, sitting balance/endurance    PT Home Exercise Plan sitting balance unsupported hold 30-45sec with aide    Consulted and Agree with Plan of Care Patient           Patient will benefit from skilled therapeutic intervention in order to improve the following deficits and impairments:  Abnormal gait, Decreased balance, Decreased endurance, Decreased mobility, Difficulty walking, Hypomobility, Impaired sensation, Decreased range of motion, Improper body mechanics, Impaired perceived functional ability, Decreased activity tolerance, Decreased knowledge of use of DME, Decreased safety awareness, Decreased strength, Impaired flexibility, Impaired UE functional use, Postural dysfunction  Visit Diagnosis: Muscle weakness (generalized)  Central cord syndrome, sequela (HCC)  Other abnormalities of gait and mobility  DVT (deep vein thrombosis) in pregnancy  Abdominal distention     Problem  List Patient Active Problem List   Diagnosis Date Noted  . Acute appendicitis with appendiceal abscess 05/30/2018  . Hypocalcemia 02/15/2018  . Dysuria 02/15/2018  . Bilateral lower extremity edema 02/11/2018  . Vaginal yeast infection 01/30/2018  . Chest tightness 01/27/2018  . Healthcare-associated pneumonia 01/25/2018  . Pleural effusion, not elsewhere classified 01/25/2018  . Acute deep vein thrombosis (DVT) of left lower extremity ([place]) 01/24/2018  . Acute deep vein thrombosis (DVT) of right lower extremity ([place]) 01/24/2018  . Chronic allergic rhinitis 01/24/2018  . Depression with anxiety 01/24/2018  . UTI due to Klebsiella species 01/24/2018  . Protein-calorie malnutrition, severe ([place]) 01/24/2018  . S/P insertion of IVC (inferior vena caval) filter 01/24/2018  . Reactive depression   . Hypokalemia   . Leukocytosis   . Essential hypertension   . Trauma   . 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([place])   . Neuropathic pain   . Neurogenic bowel   . Neurogenic bladder   . Benign essential HTN   . Acute blood loss anemia   . Central cord syndrome at C6 level of cervical spinal cord ([place]) 11/29/2017  . Central cord syndrome [facility]) 11/29/2017    Alanson Puls, PT DPT 08/28/2019, 3:29 PM  [place] MAIN [facility] SERVICES [address] [place], [place], [address] Phone: [phone]   Fax:  [phone]  Name: Sherry [name] MRN: [id number] Date of Birth: January 23, 1935

## 2019-08-30 ENCOUNTER — Ambulatory Visit: Payer: Medicare HMO | Admitting: Physical Therapy

## 2019-08-30 ENCOUNTER — Other Ambulatory Visit: Payer: Self-pay

## 2019-08-30 ENCOUNTER — Encounter: Payer: Self-pay | Admitting: Physical Therapy

## 2019-08-30 DIAGNOSIS — M6281 Muscle weakness (generalized): Secondary | ICD-10-CM

## 2019-08-30 DIAGNOSIS — G8220 Paraplegia, unspecified: Secondary | ICD-10-CM

## 2019-08-30 DIAGNOSIS — I82891 Chronic embolism and thrombosis of other specified veins: Secondary | ICD-10-CM | POA: Diagnosis not present

## 2019-08-30 DIAGNOSIS — I10 Essential (primary) hypertension: Secondary | ICD-10-CM | POA: Diagnosis not present

## 2019-08-30 DIAGNOSIS — O2230 Deep phlebothrombosis in pregnancy, unspecified trimester: Secondary | ICD-10-CM

## 2019-08-30 DIAGNOSIS — S14129S Central cord syndrome at unspecified level of cervical spinal cord, sequela: Secondary | ICD-10-CM

## 2019-08-30 DIAGNOSIS — F39 Unspecified mood [affective] disorder: Secondary | ICD-10-CM

## 2019-08-30 DIAGNOSIS — R2689 Other abnormalities of gait and mobility: Secondary | ICD-10-CM

## 2019-08-30 DIAGNOSIS — K592 Neurogenic bowel, not elsewhere classified: Secondary | ICD-10-CM | POA: Diagnosis not present

## 2019-08-30 DIAGNOSIS — R140 Abdominal distension (gaseous): Secondary | ICD-10-CM

## 2019-08-30 NOTE — Therapy (Signed)
[place] MAIN [facility] SERVICES [address] [place], [place], [address] Phone: [phone]   Fax:  [phone]  Physical Therapy Treatment  Patient Details  Name: Sherry [name] MRN: [id number] Date of Birth: [birth year] Referring Provider (PT): Viviana Simpler   Encounter Date: 08/30/2019   PT End of Session - 08/30/19 1520    Visit Number 4    Number of Visits 16    Date for PT Re-Evaluation 10/16/19    Authorization Type aetna medicare FOTO performed by PT on eval (7/20), score 12    PT Start Time 0207    PT Stop Time 0250    PT Time Calculation (min) 43 min    Equipment Utilized During Treatment Gait belt;Other (comment)   hoyer lift   Activity Tolerance Patient tolerated treatment well    Behavior During Therapy WFL for tasks assessed/performed           Past Medical History:  Diagnosis Date  . Acute blood loss anemia   . Arthritis   . Cancer ([place])    skin  . Central cord syndrome at C6 level of cervical spinal cord ([place]) 11/29/2017  . Hypertension   . Protein-calorie malnutrition, severe ([place]) 01/24/2018  . S/P insertion of IVC (inferior vena caval) filter 01/24/2018  . 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[facility])     Past Surgical History:  Procedure Laterality Date  . ANTERIOR CERVICAL DECOMP/DISCECTOMY FUSION N/A 11/29/2017   Procedure: Cervical five-six, six-seven Anterior Cervical Decompression Fusion;  Surgeon: Judith Part, MD;  Location: [place];  Service: Neurosurgery;  Laterality: N/A;  Cervical five-six, six-seven Anterior Cervical Decompression Fusion  . CATARACT EXTRACTION    . IR IVC FILTER PLMT / S&I /IMG GUID/MOD SED  12/08/2017    There were no vitals filed for this visit.   Subjective Assessment - 08/30/19 1519    Subjective Patient stated that she is doing well today, performed HEP. She has restorative aide to help her with exercises.    Patient is accompained by: --   aide   Pertinent History Pt is an 85 yo female  that fell in 2019, fractured vertebrae in her neck and in her low back per family. 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 After hospital stay, pt discharged to [facility] for ~1 month, experienced severe UTI as well as bilateral DVT (IVC filter placed). Discharged to [facility], stayed for about a year, and then moved to [facility] in April 2021. Was receiving PT, but per family facility reported that did not have adequate equipment to maximize PT for her. Pt until about 1 month ago was practicing sit to stand transfers with 1-2 people, and working on static standing. Has a brace for L foot due to PF. Pt currently needs assistance with all ADLs (able to assist with donning/doffing her shirt), bed baths, and needs a hoyer lift for transfers. Able to drive power wheelchair without assistance.    Limitations Standing;Walking;House hold activities;Lifting    How long can you sit comfortably? NA    How long can you stand comfortably? unable    How long can you walk comfortably? unable    Patient Stated Goals "to stand up and walk"            Treatment: Transfer from power chair to mat with hoyer lift and max assist. Sitting in power chair and trunk flex extension x 5  LAQ LLE x 40 , with 3 sec hold x 15 LAQ RLE  X 15 with fatigue Seated: Side bending R <> sitting x 10 x 2  Side bending L <> sitting x 10 x 2  Rolling x 5 reps to the left side with mod assist  Patient is soiled with BM and needs personal hygiene so session was ended. Patient performed with instruction, verbal cues, tactile cues of therapist: goal: increase tissue extensibility, promote proper posture, improve mobility Patient was asked to wear her shoes for possible standing up at next treatment.                          PT Education - 08/30/19 1520    Education Details education for bed mobility    Person(s) Educated Patient    Methods Explanation    Comprehension Verbalized  understanding;Tactile cues required;Need further instruction            PT Short Term Goals - 08/21/19 1515      PT SHORT TERM GOAL #1   Title The patient will perform initial HEP with minimum assistance in order to improve strength and function.    Time 4    Period Weeks    Status New    Target Date 09/18/19             PT Long Term Goals - 08/21/19 1515      PT LONG TERM GOAL #1   Title The patient will be compliant with finalized HEP with minimum assistance in preparation for self management and maintenance of condition.    Time 8    Period Weeks    Status New    Target Date 10/16/19      PT LONG TERM GOAL #2   Title The patient will demonstrate at least 10 point improvement on FOTO score indicating an improved ability to perform functional activities.    Baseline on eval (/720) score 12    Time 8    Period Weeks    Status New    Target Date 10/16/19      PT LONG TERM GOAL #3   Title The patient will demonstrate lateral scooting  for assistance with transfers with minA to maximize independence.    Time 8    Period Weeks    Status New    Target Date 10/16/19      PT LONG TERM GOAL #4   Title The patient will demonstrate a squat pivot transfer with minimum assistance to maximize independence and mobility.    Time 8    Period Weeks    Status New    Target Date 10/16/19      PT LONG TERM GOAL #5   Title The patient will demonstrate sitting without UE support for 2-5 minutes at EOB to improve participation and maximize independence with ADLs.    Time 8    Period Weeks    Status New    Target Date 10/16/19                 Plan - 08/30/19 1521    Clinical Impression Statement Pt requires direction and verbal cues for correct performance of mobility and strengthening exercises. Patient demonstrates weakness in BLE and performs open and closed chain exercises with no reports of pain. Pt was able to perform all exercises with min assist and VC for  technique Pt encouraged continuing HEP .Follow-up as scheduled.   Personal Factors and Comorbidities Age;Time since onset of injury/illness/exacerbation;Comorbidity 3+;Fitness    Comorbidities HTN, quadriparesis,  history of DVT, neurogenic bladder    Examination-Activity Limitations Bathing;Hygiene/Grooming;Squat;Bed Mobility;Lift;Bend;Stand;Engineer, manufacturing;Toileting;Self Feeding;Transfers;Continence;Sit;Dressing;Sleep;Carry    Examination-Participation Restrictions Church;Laundry;Cleaning;Medication Management;Community Activity;Meal Prep;Interpersonal Relationship    Stability/Clinical Decision Making Evolving/Moderate complexity    Rehab Potential Fair    PT Frequency 2x / week    PT Duration 8 weeks    PT Treatment/Interventions ADLs/Self Care Home Management;Electrical Stimulation;Therapeutic activities;Wheelchair mobility training;Vasopneumatic Device;Joint Manipulations;Vestibular;Passive range of motion;Patient/family education;Therapeutic exercise;DME Instruction;Biofeedback;Aquatic Therapy;Moist Heat;Gait training;Balance training;Orthotic Fit/Training;Dry needling;Energy conservation;Taping;Splinting;Neuromuscular re-education;Cryotherapy;Ultrasound;Functional mobility training    PT Next Visit Plan platform attachments for RW to assess standing? transfers, sitting balance/endurance    PT Home Exercise Plan sitting balance unsupported hold 30-45sec with aide    Consulted and Agree with Plan of Care Patient           Patient will benefit from skilled therapeutic intervention in order to improve the following deficits and impairments:  Abnormal gait, Decreased balance, Decreased endurance, Decreased mobility, Difficulty walking, Hypomobility, Impaired sensation, Decreased range of motion, Improper body mechanics, Impaired perceived functional ability, Decreased activity tolerance, Decreased knowledge of use of DME, Decreased safety awareness, Decreased strength, Impaired  flexibility, Impaired UE functional use, Postural dysfunction  Visit Diagnosis: Muscle weakness (generalized)  Central cord syndrome, sequela (HCC)  Other abnormalities of gait and mobility  DVT (deep vein thrombosis) in pregnancy  Abdominal distention     Problem List Patient Active Problem List   Diagnosis Date Noted  . Acute appendicitis with appendiceal abscess 05/30/2018  . Hypocalcemia 02/15/2018  . Dysuria 02/15/2018  . Bilateral lower extremity edema 02/11/2018  . Vaginal yeast infection 01/30/2018  . Chest tightness 01/27/2018  . Healthcare-associated pneumonia 01/25/2018  . Pleural effusion, not elsewhere classified 01/25/2018  . Acute deep vein thrombosis (DVT) of left lower extremity ([place]) 01/24/2018  . Acute deep vein thrombosis (DVT) of right lower extremity ([place]) 01/24/2018  . Chronic allergic rhinitis 01/24/2018  . Depression with anxiety 01/24/2018  . UTI due to Klebsiella species 01/24/2018  . Protein-calorie malnutrition, severe ([place]) 01/24/2018  . S/P insertion of IVC (inferior vena caval) filter 01/24/2018  . Reactive depression   . Hypokalemia   . Leukocytosis   . Essential hypertension   . Trauma   . 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([place])   . Neuropathic pain   . Neurogenic bowel   . Neurogenic bladder   . Benign essential HTN   . Acute blood loss anemia   . Central cord syndrome at C6 level of cervical spinal cord ([place]) 11/29/2017  . Central cord syndrome [facility]) 11/29/2017    Alanson Puls, PT DPT 08/30/2019, 3:22 PM  [place] MAIN [facility] SERVICES [address] [place], [place], [address] Phone: [phone]   Fax:  [phone]  Name: Sherry [name] MRN: [id number] Date of Birth: 07-Feb-1934

## 2019-09-03 ENCOUNTER — Ambulatory Visit: Payer: Medicare HMO | Attending: Internal Medicine | Admitting: Physical Therapy

## 2019-09-03 ENCOUNTER — Other Ambulatory Visit: Payer: Self-pay

## 2019-09-03 ENCOUNTER — Encounter: Payer: Self-pay | Admitting: Physical Therapy

## 2019-09-03 DIAGNOSIS — R278 Other lack of coordination: Secondary | ICD-10-CM | POA: Insufficient documentation

## 2019-09-03 DIAGNOSIS — M6281 Muscle weakness (generalized): Secondary | ICD-10-CM | POA: Diagnosis present

## 2019-09-03 DIAGNOSIS — R2689 Other abnormalities of gait and mobility: Secondary | ICD-10-CM | POA: Diagnosis present

## 2019-09-03 DIAGNOSIS — S14129S Central cord syndrome at unspecified level of cervical spinal cord, sequela: Secondary | ICD-10-CM | POA: Diagnosis not present

## 2019-09-03 DIAGNOSIS — R140 Abdominal distension (gaseous): Secondary | ICD-10-CM | POA: Diagnosis present

## 2019-09-03 DIAGNOSIS — O2230 Deep phlebothrombosis in pregnancy, unspecified trimester: Secondary | ICD-10-CM

## 2019-09-03 NOTE — Therapy (Signed)
[place] MAIN [facility] SERVICES [address] [place], [place], [address] Phone: [phone]   Fax:  [phone]  Physical Therapy Treatment  Patient Details  Name: Sherry [name] MRN: [id number] Date of Birth: 1934/09/12 Referring Provider (PT): Viviana Simpler   Encounter Date: 09/03/2019   PT End of Session - 09/03/19 1517    Visit Number 5    Number of Visits 16    Date for PT Re-Evaluation 10/16/19    Authorization Type aetna medicare FOTO performed by PT on eval (7/20), score 12    PT Start Time 0200    PT Stop Time 0255    PT Time Calculation (min) 55 min    Equipment Utilized During Treatment Gait belt;Other (comment)   hoyer lift   Activity Tolerance Patient tolerated treatment well    Behavior During Therapy WFL for tasks assessed/performed           Past Medical History:  Diagnosis Date  . Acute blood loss anemia   . Arthritis   . Cancer ([place])    skin  . Central cord syndrome at C6 level of cervical spinal cord ([place]) 11/29/2017  . Hypertension   . Protein-calorie malnutrition, severe ([place]) 01/24/2018  . S/P insertion of IVC (inferior vena caval) filter 01/24/2018  . 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[facility])     Past Surgical History:  Procedure Laterality Date  . ANTERIOR CERVICAL DECOMP/DISCECTOMY FUSION N/A 11/29/2017   Procedure: Cervical five-six, six-seven Anterior Cervical Decompression Fusion;  Surgeon: Judith Part, MD;  Location: [place];  Service: Neurosurgery;  Laterality: N/A;  Cervical five-six, six-seven Anterior Cervical Decompression Fusion  . CATARACT EXTRACTION    . IR IVC FILTER PLMT / S&I /IMG GUID/MOD SED  12/08/2017    There were no vitals filed for this visit.   Subjective Assessment - 09/03/19 1516    Subjective Patient stated that she is doing well today, performed HEP. She has restorative aide to help her with exercises.    Patient is accompained by: --   aide   Pertinent History Pt is an 85 yo female that  fell in 2019, fractured vertebrae in her neck and in her low back per family. 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 After hospital stay, pt discharged to CIR for ~1 month, experienced severe UTI as well as bilateral DVT (IVC filter placed). Discharged to [facility], stayed for about a year, and then moved to [facility] in April 2021. Was receiving PT, but per family facility reported that did not have adequate equipment to maximize PT for her. Pt until about 1 month ago was practicing sit to stand transfers with 1-2 people, and working on static standing. Has a brace for L foot due to PF. Pt currently needs assistance with all ADLs (able to assist with donning/doffing her shirt), bed baths, and needs a hoyer lift for transfers. Able to drive power wheelchair without assistance.    Limitations Standing;Walking;House hold activities;Lifting    How long can you sit comfortably? NA    How long can you stand comfortably? unable    How long can you walk comfortably? unable    Patient Stated Goals "to stand up and walk"    Currently in Pain? No/denies    Multiple Pain Sites No           Treatment:   Hoyer lift from power wc <> mat  Rolling left and right with mod assist x 1  Supine to sit with  mod assist x 3 reps Sit <> stand with light gait and standing x 10 mins x 2 , locking and unlocking left and right knee , L heel is not able to touch the floor  Reviewed HEP  .Patient performed with instruction, verbal cues, tactile cues of therapist: goal: increase tissue extensibility, promote proper posture, improve mobility                         PT Education - 09/03/19 1516    Education Details HEP    Person(s) Educated Patient    Methods Explanation    Comprehension Need further instruction;Verbalized understanding;Returned demonstration            PT Short Term Goals - 08/21/19 1515      PT SHORT TERM GOAL #1   Title The patient will perform  initial HEP with minimum assistance in order to improve strength and function.    Time 4    Period Weeks    Status New    Target Date 09/18/19             PT Long Term Goals - 08/21/19 1515      PT LONG TERM GOAL #1   Title The patient will be compliant with finalized HEP with minimum assistance in preparation for self management and maintenance of condition.    Time 8    Period Weeks    Status New    Target Date 10/16/19      PT LONG TERM GOAL #2   Title The patient will demonstrate at least 10 point improvement on FOTO score indicating an improved ability to perform functional activities.    Baseline on eval (/720) score 12    Time 8    Period Weeks    Status New    Target Date 10/16/19      PT LONG TERM GOAL #3   Title The patient will demonstrate lateral scooting  for assistance with transfers with minA to maximize independence.    Time 8    Period Weeks    Status New    Target Date 10/16/19      PT LONG TERM GOAL #4   Title The patient will demonstrate a squat pivot transfer with minimum assistance to maximize independence and mobility.    Time 8    Period Weeks    Status New    Target Date 10/16/19      PT LONG TERM GOAL #5   Title The patient will demonstrate sitting without UE support for 2-5 minutes at EOB to improve participation and maximize independence with ADLs.    Time 8    Period Weeks    Status New    Target Date 10/16/19                 Plan - 09/03/19 1517    Clinical Impression Statement Patient performs bed mobility training and transfer training with use of light gait assist. She is able to weight shift with weight assist with light gait to lock and unlock her knees into full extension. She has fatigue with standing exercises. Patient will benefit from skilled PT to improve BLE, trunk strength and mobility.    Personal Factors and Comorbidities Age;Time since onset of injury/illness/exacerbation;Comorbidity 3+;Fitness    Comorbidities  HTN, quadriparesis, history of DVT, neurogenic bladder    Examination-Activity Limitations Bathing;Hygiene/Grooming;Squat;Bed Mobility;Lift;Bend;Stand;Engineer, manufacturing;Toileting;Self Feeding;Transfers;Continence;Sit;Dressing;Sleep;Carry    Examination-Participation Restrictions Church;Laundry;Cleaning;Medication Management;Community Activity;Meal Prep;Interpersonal Relationship  Stability/Clinical Decision Making Evolving/Moderate complexity    Rehab Potential Fair    PT Frequency 2x / week    PT Duration 8 weeks    PT Treatment/Interventions ADLs/Self Care Home Management;Electrical Stimulation;Therapeutic activities;Wheelchair mobility training;Vasopneumatic Device;Joint Manipulations;Vestibular;Passive range of motion;Patient/family education;Therapeutic exercise;DME Instruction;Biofeedback;Aquatic Therapy;Moist Heat;Gait training;Balance training;Orthotic Fit/Training;Dry needling;Energy conservation;Taping;Splinting;Neuromuscular re-education;Cryotherapy;Ultrasound;Functional mobility training    PT Next Visit Plan platform attachments for RW to assess standing? transfers, sitting balance/endurance    PT Home Exercise Plan sitting balance unsupported hold 30-45sec with aide    Consulted and Agree with Plan of Care Patient           Patient will benefit from skilled therapeutic intervention in order to improve the following deficits and impairments:  Abnormal gait, Decreased balance, Decreased endurance, Decreased mobility, Difficulty walking, Hypomobility, Impaired sensation, Decreased range of motion, Improper body mechanics, Impaired perceived functional ability, Decreased activity tolerance, Decreased knowledge of use of DME, Decreased safety awareness, Decreased strength, Impaired flexibility, Impaired UE functional use, Postural dysfunction  Visit Diagnosis: Central cord syndrome, sequela (HCC)  Muscle weakness (generalized)  Other abnormalities of gait and  mobility  Abdominal distention  DVT (deep vein thrombosis) in pregnancy     Problem List Patient Active Problem List   Diagnosis Date Noted  . Acute appendicitis with appendiceal abscess 05/30/2018  . Hypocalcemia 02/15/2018  . Dysuria 02/15/2018  . Bilateral lower extremity edema 02/11/2018  . Vaginal yeast infection 01/30/2018  . Chest tightness 01/27/2018  . Healthcare-associated pneumonia 01/25/2018  . Pleural effusion, not elsewhere classified 01/25/2018  . Acute deep vein thrombosis (DVT) of left lower extremity ([place]) 01/24/2018  . Acute deep vein thrombosis (DVT) of right lower extremity ([place]) 01/24/2018  . Chronic allergic rhinitis 01/24/2018  . Depression with anxiety 01/24/2018  . UTI due to Klebsiella species 01/24/2018  . Protein-calorie malnutrition, severe ([place]) 01/24/2018  . S/P insertion of IVC (inferior vena caval) filter 01/24/2018  . Reactive depression   . Hypokalemia   . Leukocytosis   . Essential hypertension   . Trauma   . 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([place])   . Neuropathic pain   . Neurogenic bowel   . Neurogenic bladder   . Benign essential HTN   . Acute blood loss anemia   . Central cord syndrome at C6 level of cervical spinal cord ([place]) 11/29/2017  . Central cord syndrome [facility]) 11/29/2017    Alanson Puls, PT DPT 09/03/2019, 3:18 PM  [place] MAIN [facility] SERVICES [address] [place], [place], [address] Phone: [phone]   Fax:  [phone]  Name: Sherry [name] MRN: [id number] Date of Birth: Sep 11, 1934

## 2019-09-04 ENCOUNTER — Encounter: Payer: Medicare HMO | Admitting: Speech Pathology

## 2019-09-05 ENCOUNTER — Other Ambulatory Visit: Payer: Self-pay

## 2019-09-05 ENCOUNTER — Ambulatory Visit: Payer: Medicare HMO | Admitting: Physical Therapy

## 2019-09-05 ENCOUNTER — Encounter: Payer: Self-pay | Admitting: Physical Therapy

## 2019-09-05 DIAGNOSIS — O2230 Deep phlebothrombosis in pregnancy, unspecified trimester: Secondary | ICD-10-CM

## 2019-09-05 DIAGNOSIS — R140 Abdominal distension (gaseous): Secondary | ICD-10-CM

## 2019-09-05 DIAGNOSIS — S14129S Central cord syndrome at unspecified level of cervical spinal cord, sequela: Secondary | ICD-10-CM | POA: Diagnosis not present

## 2019-09-05 DIAGNOSIS — R2689 Other abnormalities of gait and mobility: Secondary | ICD-10-CM

## 2019-09-05 DIAGNOSIS — M6281 Muscle weakness (generalized): Secondary | ICD-10-CM

## 2019-09-05 NOTE — Therapy (Signed)
[place] MAIN [facility] SERVICES [address] [place], [place], [address] Phone: [phone]   Fax:  [phone]  Physical Therapy Treatment  Patient Details  Name: Sherry [name] MRN: [id number] Date of Birth: December 04, 1934 Referring Provider (PT): Viviana Simpler   Encounter Date: 09/05/2019   PT End of Session - 09/05/19 1543    Visit Number 6    Number of Visits 16    Date for PT Re-Evaluation 10/16/19    Authorization Type aetna medicare FOTO performed by PT on eval (7/20), score 12    PT Start Time 0200    PT Stop Time 0300    PT Time Calculation (min) 60 min    Equipment Utilized During Treatment Gait belt;Other (comment)   hoyer lift   Activity Tolerance Patient tolerated treatment well    Behavior During Therapy WFL for tasks assessed/performed           Past Medical History:  Diagnosis Date  . Acute blood loss anemia   . Arthritis   . Cancer ([place])    skin  . Central cord syndrome at C6 level of cervical spinal cord ([place]) 11/29/2017  . Hypertension   . Protein-calorie malnutrition, severe ([place]) 01/24/2018  . S/P insertion of IVC (inferior vena caval) filter 01/24/2018  . 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[facility])     Past Surgical History:  Procedure Laterality Date  . ANTERIOR CERVICAL DECOMP/DISCECTOMY FUSION N/A 11/29/2017   Procedure: Cervical five-six, six-seven Anterior Cervical Decompression Fusion;  Surgeon: Judith Part, MD;  Location: [place];  Service: Neurosurgery;  Laterality: N/A;  Cervical five-six, six-seven Anterior Cervical Decompression Fusion  . CATARACT EXTRACTION    . IR IVC FILTER PLMT / S&I /IMG GUID/MOD SED  12/08/2017    There were no vitals filed for this visit.   Subjective Assessment - 09/05/19 1542    Subjective Patient stated that she is doing well today, performed HEP.    Patient is accompained by: --   aide   Pertinent History Pt is an 85 yo female that fell in 2019, fractured vertebrae in her neck and in  her low back per family. 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 After hospital stay, pt discharged to CIR for ~1 month, experienced severe UTI as well as bilateral DVT (IVC filter placed). Discharged to [facility], stayed for about a year, and then moved to [facility] in April 2021. Was receiving PT, but per family facility reported that did not have adequate equipment to maximize PT for her. Pt until about 1 month ago was practicing sit to stand transfers with 1-2 people, and working on static standing. Has a brace for L foot due to PF. Pt currently needs assistance with all ADLs (able to assist with donning/doffing her shirt), bed baths, and needs a hoyer lift for transfers. Able to drive power wheelchair without assistance.    Limitations Standing;Walking;House hold activities;Lifting    How long can you sit comfortably? NA    How long can you stand comfortably? unable    How long can you walk comfortably? unable    Patient Stated Goals "to stand up and walk"    Currently in Pain? No/denies    Multiple Pain Sites No           Treatment:   Hoyer lift from power wc <> mat  Rolling left and right with mod assist x 1  Supine to sit with mod assist x 3 reps Sit <> stand with  light gait and standing x 10 mins x 2 , locking and unlocking left and right knee , L heel is not able to touch the floor  Reviewed HEP  .Patient performed with instruction, verbal cues, tactile cues of therapist: goal:increase tissue extensibility, promote proper posture, improve mobility                          PT Education - 09/05/19 1543    Education Details HEP    Person(s) Educated Patient    Methods Explanation    Comprehension Returned demonstration;Need further instruction            PT Short Term Goals - 08/21/19 1515      PT SHORT TERM GOAL #1   Title The patient will perform initial HEP with minimum assistance in order to improve strength and function.     Time 4    Period Weeks    Status New    Target Date 09/18/19             PT Long Term Goals - 08/21/19 1515      PT LONG TERM GOAL #1   Title The patient will be compliant with finalized HEP with minimum assistance in preparation for self management and maintenance of condition.    Time 8    Period Weeks    Status New    Target Date 10/16/19      PT LONG TERM GOAL #2   Title The patient will demonstrate at least 10 point improvement on FOTO score indicating an improved ability to perform functional activities.    Baseline on eval (/720) score 12    Time 8    Period Weeks    Status New    Target Date 10/16/19      PT LONG TERM GOAL #3   Title The patient will demonstrate lateral scooting  for assistance with transfers with minA to maximize independence.    Time 8    Period Weeks    Status New    Target Date 10/16/19      PT LONG TERM GOAL #4   Title The patient will demonstrate a squat pivot transfer with minimum assistance to maximize independence and mobility.    Time 8    Period Weeks    Status New    Target Date 10/16/19      PT LONG TERM GOAL #5   Title The patient will demonstrate sitting without UE support for 2-5 minutes at EOB to improve participation and maximize independence with ADLs.    Time 8    Period Weeks    Status New    Target Date 10/16/19                 Plan - 09/05/19 1544    Clinical Impression Statement Patient performs bed mobility training and transfer training with use of light gait assist. She is able to weight shift with weight assist with light gait to lock and unlock her knees into full extension. She has fatigue with standing exercises. Patient will benefit from skilled PT to improve BLE, trunk strength and mobility    Personal Factors and Comorbidities Age;Time since onset of injury/illness/exacerbation;Comorbidity 3+;Fitness    Comorbidities HTN, quadriparesis, history of DVT, neurogenic bladder    Examination-Activity  Limitations Bathing;Hygiene/Grooming;Squat;Bed Mobility;Lift;Bend;Stand;Engineer, manufacturing;Toileting;Self Feeding;Transfers;Continence;Sit;Dressing;Sleep;Carry    Examination-Participation Restrictions Church;Laundry;Cleaning;Medication Management;Community Activity;Meal Prep;Interpersonal Relationship    Stability/Clinical Decision Making Evolving/Moderate complexity  Rehab Potential Fair    PT Frequency 2x / week    PT Duration 8 weeks    PT Treatment/Interventions ADLs/Self Care Home Management;Electrical Stimulation;Therapeutic activities;Wheelchair mobility training;Vasopneumatic Device;Joint Manipulations;Vestibular;Passive range of motion;Patient/family education;Therapeutic exercise;DME Instruction;Biofeedback;Aquatic Therapy;Moist Heat;Gait training;Balance training;Orthotic Fit/Training;Dry needling;Energy conservation;Taping;Splinting;Neuromuscular re-education;Cryotherapy;Ultrasound;Functional mobility training    PT Next Visit Plan platform attachments for RW to assess standing? transfers, sitting balance/endurance    PT Home Exercise Plan sitting balance unsupported hold 30-45sec with aide    Consulted and Agree with Plan of Care Patient           Patient will benefit from skilled therapeutic intervention in order to improve the following deficits and impairments:  Abnormal gait, Decreased balance, Decreased endurance, Decreased mobility, Difficulty walking, Hypomobility, Impaired sensation, Decreased range of motion, Improper body mechanics, Impaired perceived functional ability, Decreased activity tolerance, Decreased knowledge of use of DME, Decreased safety awareness, Decreased strength, Impaired flexibility, Impaired UE functional use, Postural dysfunction  Visit Diagnosis: Muscle weakness (generalized)  Central cord syndrome, sequela (HCC)  Other abnormalities of gait and mobility  Abdominal distention  DVT (deep vein thrombosis) in  pregnancy     Problem List Patient Active Problem List   Diagnosis Date Noted  . Acute appendicitis with appendiceal abscess 05/30/2018  . Hypocalcemia 02/15/2018  . Dysuria 02/15/2018  . Bilateral lower extremity edema 02/11/2018  . Vaginal yeast infection 01/30/2018  . Chest tightness 01/27/2018  . Healthcare-associated pneumonia 01/25/2018  . Pleural effusion, not elsewhere classified 01/25/2018  . Acute deep vein thrombosis (DVT) of left lower extremity ([place]) 01/24/2018  . Acute deep vein thrombosis (DVT) of right lower extremity ([place]) 01/24/2018  . Chronic allergic rhinitis 01/24/2018  . Depression with anxiety 01/24/2018  . UTI due to Klebsiella species 01/24/2018  . Protein-calorie malnutrition, severe ([place]) 01/24/2018  . S/P insertion of IVC (inferior vena caval) filter 01/24/2018  . Reactive depression   . Hypokalemia   . Leukocytosis   . Essential hypertension   . Trauma   . 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([place])   . Neuropathic pain   . Neurogenic bowel   . Neurogenic bladder   . Benign essential HTN   . Acute blood loss anemia   . Central cord syndrome at C6 level of cervical spinal cord ([place]) 11/29/2017  . Central cord syndrome [facility]) 11/29/2017    Alanson Puls, PT DPT 09/05/2019, 3:45 PM  [place] MAIN [facility] SERVICES [address] [place], [place], [address] Phone: [phone]   Fax:  [phone]  Name: Sherry [name] MRN: [id number] Date of Birth: Feb 23, 1934

## 2019-09-10 ENCOUNTER — Ambulatory Visit: Payer: Medicare HMO | Admitting: Physical Therapy

## 2019-09-10 ENCOUNTER — Other Ambulatory Visit: Payer: Self-pay

## 2019-09-10 DIAGNOSIS — R140 Abdominal distension (gaseous): Secondary | ICD-10-CM

## 2019-09-10 DIAGNOSIS — S14129S Central cord syndrome at unspecified level of cervical spinal cord, sequela: Secondary | ICD-10-CM

## 2019-09-10 DIAGNOSIS — M24575 Contracture, left foot: Secondary | ICD-10-CM | POA: Diagnosis not present

## 2019-09-10 DIAGNOSIS — O2230 Deep phlebothrombosis in pregnancy, unspecified trimester: Secondary | ICD-10-CM

## 2019-09-10 DIAGNOSIS — R2689 Other abnormalities of gait and mobility: Secondary | ICD-10-CM

## 2019-09-10 DIAGNOSIS — M6281 Muscle weakness (generalized): Secondary | ICD-10-CM

## 2019-09-10 NOTE — Therapy (Signed)
[place] MAIN [facility] SERVICES [address] [place], [place], [address] Phone: [phone]   Fax:  [phone]  Patient Details  Name: Sherry [name] MRN: [id number] Date of Birth: 1934-02-08 Referring Provider:  Venia Carbon, MD  Encounter Date: 09/10/2019 Patient arrived and her power wc stopped working and would not turn on. She was unable to be seen for her PT visit.   [address], [place] DPT 09/10/2019, 2:58 PM  [place] MAIN [facility] SERVICES [address] [place], [place], [address] Phone: [phone]   Fax:  [phone]

## 2019-09-12 ENCOUNTER — Ambulatory Visit: Payer: Medicare HMO | Admitting: Physical Therapy

## 2019-09-17 ENCOUNTER — Encounter: Payer: Self-pay | Admitting: Occupational Therapy

## 2019-09-17 ENCOUNTER — Other Ambulatory Visit: Payer: Self-pay

## 2019-09-17 ENCOUNTER — Ambulatory Visit: Payer: Medicare HMO | Admitting: Occupational Therapy

## 2019-09-17 DIAGNOSIS — M6281 Muscle weakness (generalized): Secondary | ICD-10-CM

## 2019-09-17 DIAGNOSIS — R278 Other lack of coordination: Secondary | ICD-10-CM

## 2019-09-17 DIAGNOSIS — S14129S Central cord syndrome at unspecified level of cervical spinal cord, sequela: Secondary | ICD-10-CM

## 2019-09-19 ENCOUNTER — Other Ambulatory Visit: Payer: Self-pay

## 2019-09-19 ENCOUNTER — Encounter: Payer: Self-pay | Admitting: Physical Therapy

## 2019-09-19 ENCOUNTER — Ambulatory Visit: Payer: Medicare HMO | Admitting: Physical Therapy

## 2019-09-19 DIAGNOSIS — R140 Abdominal distension (gaseous): Secondary | ICD-10-CM

## 2019-09-19 DIAGNOSIS — O2230 Deep phlebothrombosis in pregnancy, unspecified trimester: Secondary | ICD-10-CM

## 2019-09-19 DIAGNOSIS — S14129S Central cord syndrome at unspecified level of cervical spinal cord, sequela: Secondary | ICD-10-CM

## 2019-09-19 DIAGNOSIS — R2689 Other abnormalities of gait and mobility: Secondary | ICD-10-CM

## 2019-09-19 DIAGNOSIS — M6281 Muscle weakness (generalized): Secondary | ICD-10-CM

## 2019-09-19 NOTE — Therapy (Signed)
[place] MAIN [facility] SERVICES [address] [place], [place], [address] Phone: [phone]   Fax:  [phone]  Physical Therapy Treatment  Patient Details  Name: Sherry [name] MRN: [id number] Date of Birth: 08-17-1934 Referring Provider (PT): Viviana Simpler   Encounter Date: 09/19/2019   PT End of Session - 09/19/19 1606    Visit Number 7    Number of Visits 16    Date for PT Re-Evaluation 10/16/19    Authorization Type aetna medicare FOTO performed by PT on eval (7/20), score 12    PT Start Time 0200    PT Stop Time 0300    PT Time Calculation (min) 60 min    Equipment Utilized During Treatment Gait belt;Other (comment)   hoyer lift   Activity Tolerance Patient tolerated treatment well    Behavior During Therapy WFL for tasks assessed/performed           Past Medical History:  Diagnosis Date  . Acute blood loss anemia   . Arthritis   . Cancer ([place])    skin  . Central cord syndrome at C6 level of cervical spinal cord ([place]) 11/29/2017  . Hypertension   . Protein-calorie malnutrition, severe ([place]) 01/24/2018  . S/P insertion of IVC (inferior vena caval) filter 01/24/2018  . 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[facility])     Past Surgical History:  Procedure Laterality Date  . ANTERIOR CERVICAL DECOMP/DISCECTOMY FUSION N/A 11/29/2017   Procedure: Cervical five-six, six-seven Anterior Cervical Decompression Fusion;  Surgeon: Judith Part, MD;  Location: [place];  Service: Neurosurgery;  Laterality: N/A;  Cervical five-six, six-seven Anterior Cervical Decompression Fusion  . CATARACT EXTRACTION    . IR IVC FILTER PLMT / S&I /IMG GUID/MOD SED  12/08/2017    There were no vitals filed for this visit.   Subjective Assessment - 09/19/19 1605    Subjective Patient stated that she is doing well today, performed HEP.Prosthestics is here to measure her L foot for a PF contraction boot .    Patient is accompained by: --   aide   Pertinent History Pt is an  85 yo female that fell in 2019, fractured vertebrae in her neck and in her low back per family. 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 After hospital stay, pt discharged to CIR for ~1 month, experienced severe UTI as well as bilateral DVT (IVC filter placed). Discharged to [facility], stayed for about a year, and then moved to [facility] in April 2021. Was receiving PT, but per family facility reported that did not have adequate equipment to maximize PT for her. Pt until about 1 month ago was practicing sit to stand transfers with 1-2 people, and working on static standing. Has a brace for L foot due to PF. Pt currently needs assistance with all ADLs (able to assist with donning/doffing her shirt), bed baths, and needs a hoyer lift for transfers. Able to drive power wheelchair without assistance.    Limitations Standing;Walking;House hold activities;Lifting    How long can you sit comfortably? NA    How long can you stand comfortably? unable    How long can you walk comfortably? unable    Patient Stated Goals "to stand up and walk"    Currently in Pain? No/denies    Multiple Pain Sites No           Treatment:  Hoyer lift from power wc <>mat  Rolling left and right with mod assist x 1  Supine to  sit with mod assist x 3 reps Sit <>stand with light gait and standing x 5 mins x 2 , locking and unlocking left and right knee , L heel is not able to touch the floor  Reviewed HEP  .Patient performed with instruction, verbal cues, tactile cues of therapist: goal:increase tissue extensibility, promote proper posture, improve mobility                          PT Education - 09/19/19 1606    Education Details HEP    Person(s) Educated Patient    Methods Explanation    Comprehension Verbalized understanding            PT Short Term Goals - 08/21/19 1515      PT SHORT TERM GOAL #1   Title The patient will perform initial HEP with minimum  assistance in order to improve strength and function.    Time 4    Period Weeks    Status New    Target Date 09/18/19             PT Long Term Goals - 08/21/19 1515      PT LONG TERM GOAL #1   Title The patient will be compliant with finalized HEP with minimum assistance in preparation for self management and maintenance of condition.    Time 8    Period Weeks    Status New    Target Date 10/16/19      PT LONG TERM GOAL #2   Title The patient will demonstrate at least 10 point improvement on FOTO score indicating an improved ability to perform functional activities.    Baseline on eval (/720) score 12    Time 8    Period Weeks    Status New    Target Date 10/16/19      PT LONG TERM GOAL #3   Title The patient will demonstrate lateral scooting  for assistance with transfers with minA to maximize independence.    Time 8    Period Weeks    Status New    Target Date 10/16/19      PT LONG TERM GOAL #4   Title The patient will demonstrate a squat pivot transfer with minimum assistance to maximize independence and mobility.    Time 8    Period Weeks    Status New    Target Date 10/16/19      PT LONG TERM GOAL #5   Title The patient will demonstrate sitting without UE support for 2-5 minutes at EOB to improve participation and maximize independence with ADLs.    Time 8    Period Weeks    Status New    Target Date 10/16/19                 Plan - 09/19/19 1607    Clinical Impression Statement Patient performs bed mobility training and transfer training with use of light gait assist. She is able to weight shift with weight assist with light gait to lock and unlock her knees into full extension. She has fatigue with standing exercises. Patient will benefit from skilled PT to improve BLE, trunk strength and mobility    Personal Factors and Comorbidities Age;Time since onset of injury/illness/exacerbation;Comorbidity 3+;Fitness    Comorbidities HTN, quadriparesis,  history of DVT, neurogenic bladder    Examination-Activity Limitations Bathing;Hygiene/Grooming;Squat;Bed Mobility;Lift;Bend;Stand;Engineer, manufacturing;Toileting;Self Feeding;Transfers;Continence;Sit;Dressing;Sleep;Carry    Examination-Participation Restrictions Church;Laundry;Cleaning;Medication Management;Community Activity;Meal Prep;Interpersonal Relationship  Stability/Clinical Decision Making Evolving/Moderate complexity    Rehab Potential Fair    PT Frequency 2x / week    PT Duration 8 weeks    PT Treatment/Interventions ADLs/Self Care Home Management;Electrical Stimulation;Therapeutic activities;Wheelchair mobility training;Vasopneumatic Device;Joint Manipulations;Vestibular;Passive range of motion;Patient/family education;Therapeutic exercise;DME Instruction;Biofeedback;Aquatic Therapy;Moist Heat;Gait training;Balance training;Orthotic Fit/Training;Dry needling;Energy conservation;Taping;Splinting;Neuromuscular re-education;Cryotherapy;Ultrasound;Functional mobility training    PT Next Visit Plan platform attachments for RW to assess standing? transfers, sitting balance/endurance    PT Home Exercise Plan sitting balance unsupported hold 30-45sec with aide    Consulted and Agree with Plan of Care Patient           Patient will benefit from skilled therapeutic intervention in order to improve the following deficits and impairments:  Abnormal gait, Decreased balance, Decreased endurance, Decreased mobility, Difficulty walking, Hypomobility, Impaired sensation, Decreased range of motion, Improper body mechanics, Impaired perceived functional ability, Decreased activity tolerance, Decreased knowledge of use of DME, Decreased safety awareness, Decreased strength, Impaired flexibility, Impaired UE functional use, Postural dysfunction  Visit Diagnosis: Muscle weakness (generalized)  Central cord syndrome, sequela (HCC)  Other abnormalities of gait and mobility  Abdominal  distention  DVT (deep vein thrombosis) in pregnancy     Problem List Patient Active Problem List   Diagnosis Date Noted  . Acute appendicitis with appendiceal abscess 05/30/2018  . Hypocalcemia 02/15/2018  . Dysuria 02/15/2018  . Bilateral lower extremity edema 02/11/2018  . Vaginal yeast infection 01/30/2018  . Chest tightness 01/27/2018  . Healthcare-associated pneumonia 01/25/2018  . Pleural effusion, not elsewhere classified 01/25/2018  . Acute deep vein thrombosis (DVT) of left lower extremity ([place]) 01/24/2018  . Acute deep vein thrombosis (DVT) of right lower extremity ([place]) 01/24/2018  . Chronic allergic rhinitis 01/24/2018  . Depression with anxiety 01/24/2018  . UTI due to Klebsiella species 01/24/2018  . Protein-calorie malnutrition, severe ([place]) 01/24/2018  . S/P insertion of IVC (inferior vena caval) filter 01/24/2018  . Reactive depression   . Hypokalemia   . Leukocytosis   . Essential hypertension   . Trauma   . 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([place])   . Neuropathic pain   . Neurogenic bowel   . Neurogenic bladder   . Benign essential HTN   . Acute blood loss anemia   . Central cord syndrome at C6 level of cervical spinal cord ([place]) 11/29/2017  . Central cord syndrome [facility]) 11/29/2017    Alanson Puls, PT DPT 09/19/2019, 4:08 PM  [place] MAIN [facility] SERVICES [address] [place], [place], [address] Phone: [phone]   Fax:  [phone]  Name: ODYSSEY VASBINDER MRN: [id number] Date of Birth: 1934/11/11

## 2019-09-21 NOTE — Therapy (Signed)
[place] MAIN [facility] SERVICES [address] [place], [place], [address] Phone: [phone]   Fax:  [phone]  Occupational Therapy Evaluation  Patient Details  Name: Sherry [name] MRN: [id number] Date of Birth: [birth year] No data recorded  Encounter Date: 09/17/2019   OT End of Session - 09/21/19 0833    Visit Number 1    Number of Visits 24    Date for OT Re-Evaluation 12/10/19    OT Start Time 1300    OT Stop Time 1358    OT Time Calculation (min) 58 min    Activity Tolerance Patient tolerated treatment well           Past Medical History:  Diagnosis Date  . Acute blood loss anemia   . Arthritis   . Cancer ([place])    skin  . Central cord syndrome at C6 level of cervical spinal cord ([place]) 11/29/2017  . Hypertension   . Protein-calorie malnutrition, severe ([place]) 01/24/2018  . S/P insertion of IVC (inferior vena caval) filter 01/24/2018  . 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[facility])     Past Surgical History:  Procedure Laterality Date  . ANTERIOR CERVICAL DECOMP/DISCECTOMY FUSION N/A 11/29/2017   Procedure: Cervical five-six, six-seven Anterior Cervical Decompression Fusion;  Surgeon: Judith Part, MD;  Location: [place];  Service: Neurosurgery;  Laterality: N/A;  Cervical five-six, six-seven Anterior Cervical Decompression Fusion  . CATARACT EXTRACTION    . IR IVC FILTER PLMT / S&I /IMG GUID/MOD SED  12/08/2017    There were no vitals filed for this visit.   Subjective Assessment - 09/21/19 0826    Subjective  Pt reports oct 2019 fell and injured spine, fx in neck and back.  Could not do anything, surgery at [facility] for neck and had therapy IP.  Went to [facility] for therapy, was able to do some standing but then was limited with pandemic.  Moved to [facility] in April, staff was unable to work with SCI.  Occupational therapy at both places, working on sitting balance.  Has restorative care now, 5 days a week.  Personal care assistance 7 am to 7  pm, 7 days a week.    Pertinent History Patient s/p fall November 29, 2017 resulting in diagnosis of central cord syndrome at C 6 level.  She has had therapy in multiple venues but with recent move to [facility], therapy staff recommended she seek outpatient therapy for her needs.    Patient Stated Goals Patient would like to be able to move better in bed, stand and perform self care tasks.    Currently in Pain? 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       ADL   Eating/Feeding Set up    Eating/Feeding Patient Percentage --   setup for tray, uses universal cuff, assist with cutting,    Grooming Wash/dry face    Upper Body Bathing + 1 Total asssestance    Lower Body Bathing + 1 Total assistance    Upper Body Dressing Maximal assistance    Lower Body Dressing +1 Total aassistance    ADL comments Patient resides in the health care center at [facility].ADL:  Patient reports she has an air mattress and is unable to turn over in bed without assistance.  She has a power chair for mobility. Has universal cuff to use for self feeding but has not used very much,  still spills items and hard to load fork.  She has a divided plate for self feeding.  She is able to use a regular cup 8 oz sized and a regular coffee cup  but has to use both hands to manage bringing it to her mouth.  She cannot cut her food and has to have her meal setup and assist to feed as needed at times.  She is able to wash her face, brush teeth.  She has her hair done at the beauty salon but is able to comb the sides of her hair but cannot reach the back.  She is max to dependent for bathing and dressing.  She does not wear clothing with any buttons, snaps or zippers since she cannot manage them.  She is incontinent of bowel and bladder and wears briefs daily.  Requires use of edemas for bowel management.  She receives assistance with all medications.       Written Expression   Dominant Hand Right      Vision - History   Baseline Vision Wears glasses all the time    Additional Comments cataract surgery in the past, right eye has some cloudiness      Observation/Other Assessments   Focus on Therapeutic Outcomes (FOTO)  12      Sensation   Light Touch Appears Intact    Hot/Cold Appears Intact    Additional Comments Patient with numbness and tingling in bilateral arms and hands, forearms.      Coordination   9 Hole Peg Test --   unable to perform with either hand     AROM   Right Shoulder Flexion 96 Degrees    Right Shoulder ABduction 88 Degrees    Left Shoulder Flexion 115 Degrees    Left Shoulder ABduction 78 Degrees    Right Elbow Flexion 150    Right Elbow Extension -10    Left Elbow Flexion 150    Left Elbow Extension 0    Right Forearm Pronation 0 Degrees    Right Forearm Supination 80 Degrees    Left Forearm Pronation 0 Degrees    Left Forearm Supination 80 Degrees    Right Wrist Extension 20 Degrees    Right Wrist Flexion 40 Degrees    Right Wrist Radial Deviation 10 Degrees    Right Wrist Ulnar Deviation 10 Degrees    Left Wrist Extension -12 Degrees    Left Wrist Flexion 70 Degrees    Left Wrist Radial Deviation 15 Degrees    Left Wrist Ulnar Deviation 15 Degrees      Right Hand AROM   R Thumb Opposition to  Index --   unable   R  Index  MCP 0-90 80 Degrees   extension -10   R Index PIP 0-100 70 Degrees   extension 0   R Index DIP 0-70 15 Degrees   extension -10   R Long  MCP 0-90 95 Degrees   extension -5   R Long PIP 0-100 45 Degrees   extension 0   R Long DIP 0-70 20 Degrees   extension 0   R Ring  MCP 0-90 80 Degrees   extension -5   R Ring PIP 0-100 35 Degrees   extension 0   R Ring DIP 0-70 20 Degrees   extension 0   R Little  MCP 0-90 75 Degrees   extension -5   R Little PIP 0-100 40 Degrees   extension -30   R Little DIP 0-70 20 Degrees   extension -25     Left Hand AROM   L Thumb Opposition to Index --   unable   L Index  MCP 0-90 30 Degrees   extension 0   L Index PIP 0-100 10 Degrees   extension 0   L Index DIP 0-70 30 Degrees   extension -15   L Long  MCP 0-90 15 Degrees   extension 0   L Long PIP 0-100 20 Degrees   extension 0   L Long DIP 0-70 10 Degrees   extension -20   L Ring  MCP 0-90 15 Degrees   extension 0   L Ring PIP 0-100 20 Degrees   extension -20   L Ring DIP 0-70 10 Degrees   extension -10   L Little  MCP 0-90 10 Degrees   extension 0   L Little PIP 0-100 20 Degrees   extension -20   L Little DIP 0-70 10 Degrees   extension -10     Hand Function   Right Hand Grip (lbs) 0    Right Hand Lateral Pinch 6 lbs    Right Hand 3 Point Pinch 0 lbs    Left Hand Grip (lbs) 0    Left Hand Lateral Pinch 2 lbs    Left 3 point pinch 0 lbs           No opposition of thumb bilaterally Left with PIP contractures, has some thumb ABD but very little web space. Right web space limited but greater than left.  Increased flexion in right PIP versus left Small finger PIP limited passively and fixed in small finger and index finger.  Slightly more motion in MF and RF at PIP Right thumb limited IP joint with little motion, Left thumb IP joint fixed with no motion actively.      Pt requires Hoyer lift from power chair to mat Sitting balance poor            OT  Education - 09/21/19 [phone]    Education Details role of OT, goals, plan of care    Person(s) Educated Patient;Caregiver(s)    Methods Explanation    Comprehension Verbalized understanding               OT Long Term Goals - 09/21/19 0929      OT LONG TERM GOAL #1   Title Patient and caregiver will demonstrate understanding of home exercise program for ROM.    Baseline no current program at eval    Time 6    Period Weeks    Status New    Target Date 10/29/19      OT LONG TERM  GOAL #2   Title Patient will demonstrate ability to don shirt with min assist from seated position.    Baseline max at eval    Time 6    Period Weeks    Status New    Target Date 10/29/19      OT LONG TERM GOAL #3   Title Patient will demonstrate improved sitting balance at the edge of the bed to participate in self care tasks.    Baseline requires assist at eval    Time 12    Period Weeks    Status New    Target Date 12/10/19      OT LONG TERM GOAL #4   Title Patient will demonstrate improved composite finger flexion to hold deodorant to apply to underarms.    Baseline unable at eval    Time 12    Period Weeks    Status New    Target Date 12/10/19      OT LONG TERM GOAL #5   Title Pt will complete self feeding with setup, use of universal cuff and minimal spillage.    Baseline inconsistent with feeding and increased spillage.    Time 12    Period Weeks    Status New    Target Date 12/10/19      Long Term Additional Goals   Additional Long Term Goals Yes      OT LONG TERM GOAL #6   Title Patient will increase right UE ROM to comb the back of her hair with modified independence.    Baseline can comb the sides but not the back    Time 12    Period Weeks    Status New    Target Date 12/10/19                 Plan - 09/21/19 0834    Clinical Impression Statement Pt is a 85 yo female s/p fall with central cord syndrome, C6, referred to OT for OP evaluation.  Patient presents  with muscle weakness, decreased ROM UE/LE, decreased bed mobility, transfers and sitting balance, and decreased ability to perform self care tasks.  Patient would benefit from skilled OT services to maximize her safety and independence in daily tasks and to reduce caregiver burden.    OT Occupational Profile and History Detailed Assessment- Review of Records and additional review of physical, cognitive, psychosocial history related to current functional performance    Occupational performance deficits (Please refer to evaluation for details): ADL's;IADL's;Leisure;Social Participation    Body Structure / Function / Physical Skills ADL;Continence;Dexterity;Flexibility;Strength;ROM;Balance;Coordination;FMC;IADL;Endurance;UE functional use;Decreased knowledge of use of DME;GMC    Psychosocial Skills Environmental  Adaptations;Habits;Routines and Behaviors    Rehab Potential Fair    Clinical Decision Making Limited treatment options, no task modification necessary    Comorbidities Affecting Occupational Performance: Presence of comorbidities impacting occupational performance    Comorbidities impacting occupational performance description: contractures of bilateral hands, dependent transfers,    Modification or Assistance to Complete Evaluation  Min-Moderate modification of tasks or assist with assess necessary to complete eval    OT Frequency 2x / week    OT Duration 12 weeks    OT Treatment/Interventions Self-care/ADL training;Cryotherapy;Therapeutic exercise;DME and/or AE instruction;Balance training;Neuromuscular education;Manual Therapy;Splinting;Moist Heat;Passive range of motion;Therapeutic activities;Patient/family education    Consulted and Agree with Plan of Care Patient           Patient will benefit from skilled therapeutic intervention in order to improve the following deficits and impairments:   Body  Structure / Function / Physical Skills: ADL, Continence, Dexterity, Flexibility,  Strength, ROM, Balance, Coordination, FMC, IADL, Endurance, UE functional use, Decreased knowledge of use of DME, GMC   Psychosocial Skills: Environmental  Adaptations, Habits, Routines and Behaviors   Visit Diagnosis: Muscle weakness (generalized) - Plan: Ot plan of care cert/re-cert  Other lack of coordination - Plan: Ot plan of care cert/re-cert  Central cord syndrome, sequela ([place]) - Plan: Ot plan of care cert/re-cert    Problem List Patient Active Problem List   Diagnosis Date Noted  . Acute appendicitis with appendiceal abscess 05/30/2018  . Hypocalcemia 02/15/2018  . Dysuria 02/15/2018  . Bilateral lower extremity edema 02/11/2018  . Vaginal yeast infection 01/30/2018  . Chest tightness 01/27/2018  . Healthcare-associated pneumonia 01/25/2018  . Pleural effusion, not elsewhere classified 01/25/2018  . Acute deep vein thrombosis (DVT) of left lower extremity ([place]) 01/24/2018  . Acute deep vein thrombosis (DVT) of right lower extremity ([place]) 01/24/2018  . Chronic allergic rhinitis 01/24/2018  . Depression with anxiety 01/24/2018  . UTI due to Klebsiella species 01/24/2018  . Protein-calorie malnutrition, severe ([place]) 01/24/2018  . S/P insertion of IVC (inferior vena caval) filter 01/24/2018  . Reactive depression   . Hypokalemia   . Leukocytosis   . Essential hypertension   . Trauma   . 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([place])   . Neuropathic pain   . Neurogenic bowel   . Neurogenic bladder   . Benign essential HTN   . Acute blood loss anemia   . Central cord syndrome at C6 level of cervical spinal cord ([place]) 11/29/2017  . Central cord syndrome [facility]) 11/29/2017   Riggins Cisek T Tomasita Morrow, OTR/L, CLT  Anjuli Gemmill 09/21/2019, 9:45 AM  [place] MAIN [facility] SERVICES [address] [place], [place], [address] Phone: [phone]   Fax:  [phone]  Name: Sherry [name] MRN: [id number] Date of Birth: 08/21/1934

## 2019-09-24 ENCOUNTER — Ambulatory Visit: Payer: Medicare HMO | Admitting: Occupational Therapy

## 2019-09-24 ENCOUNTER — Encounter: Payer: Self-pay | Admitting: Occupational Therapy

## 2019-09-24 ENCOUNTER — Other Ambulatory Visit: Payer: Self-pay

## 2019-09-24 DIAGNOSIS — S14129S Central cord syndrome at unspecified level of cervical spinal cord, sequela: Secondary | ICD-10-CM | POA: Diagnosis not present

## 2019-09-24 DIAGNOSIS — M6281 Muscle weakness (generalized): Secondary | ICD-10-CM

## 2019-09-24 NOTE — Therapy (Signed)
[place] MAIN [facility] SERVICES [address] [place], [place], [address] Phone: [phone]   Fax:  [phone]  Occupational Therapy Treatment  Patient Details  Name: RELDA AGOSTO MRN: [id number] Date of Birth: [birth year] No data recorded  Encounter Date: 09/24/2019   OT End of Session - 09/24/19 1729    Visit Number 2    Number of Visits 24    Date for OT Re-Evaluation 12/10/19    OT Start Time 1303    OT Stop Time 1347    OT Time Calculation (min) 44 min    Activity Tolerance Patient tolerated treatment well    Behavior During Therapy [facility] for tasks assessed/performed           Past Medical History:  Diagnosis Date  . Acute blood loss anemia   . Arthritis   . Cancer ([place])    skin  . Central cord syndrome at C6 level of cervical spinal cord ([place]) 11/29/2017  . Hypertension   . Protein-calorie malnutrition, severe ([place]) 01/24/2018  . S/P insertion of IVC (inferior vena caval) filter 01/24/2018  . 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[facility])     Past Surgical History:  Procedure Laterality Date  . ANTERIOR CERVICAL DECOMP/DISCECTOMY FUSION N/A 11/29/2017   Procedure: Cervical five-six, six-seven Anterior Cervical Decompression Fusion;  Surgeon: Judith Part, MD;  Location: [place];  Service: Neurosurgery;  Laterality: N/A;  Cervical five-six, six-seven Anterior Cervical Decompression Fusion  . CATARACT EXTRACTION    . IR IVC FILTER PLMT / S&I /IMG GUID/MOD SED  12/08/2017    There were no vitals filed for this visit.   Subjective Assessment - 09/24/19 1727    Subjective  Pt reports feeling well today, denies pain. Reports having recently been resting L forearm on power wheelchair which allowed her L hand to stay in prolonged wrist flexion and composit finger extension and has been trying to make an effort to hold it in supination close to her body so she can improve her wrist extension.    Patient is accompanied by: --   caregiver   Pertinent  History Patient s/p fall November 29, 2017 resulting in diagnosis of central cord syndrome at C 6 level.  She has had therapy in multiple venues but with recent move to [facility], therapy staff recommended she seek outpatient therapy for her needs.    Patient Stated Goals Patient would like to be able to move better in bed, stand and perform self care tasks.    Currently in Pain? No/denies            OT Tx  There Act: Pt instructed in Self ROM after prolonged and gentle PROM provided to bilat wrists/hands to improve wrist extension (L worse than R) and composite finger flexion bilaterally with emphasis on MCP, PIP, and DIP separately and together. At end of session, pt demonstrated improved R hand function and expressed her appreciation and was encouraged by progress.                     OT Education - 09/24/19 1729    Education Details stretching, PROM    Person(s) Educated Patient;Caregiver(s)    Methods Explanation;Demonstration    Comprehension Verbalized understanding;Need further instruction               OT Long Term Goals - 09/21/19 0929      OT LONG TERM GOAL #1   Title Patient and caregiver will demonstrate understanding of home exercise  program for ROM.    Baseline no current program at eval    Time 6    Period Weeks    Status New    Target Date 10/29/19      OT LONG TERM GOAL #2   Title Patient will demonstrate ability to don shirt with min assist from seated position.    Baseline max at eval    Time 6    Period Weeks    Status New    Target Date 10/29/19      OT LONG TERM GOAL #3   Title Patient will demonstrate improved sitting balance at the edge of the bed to participate in self care tasks.    Baseline requires assist at eval    Time 12    Period Weeks    Status New    Target Date 12/10/19      OT LONG TERM GOAL #4   Title Patient will demonstrate improved composite finger flexion to hold deodorant to apply to underarms.     Baseline unable at eval    Time 12    Period Weeks    Status New    Target Date 12/10/19      OT LONG TERM GOAL #5   Title Pt will complete self feeding with setup, use of universal cuff and minimal spillage.    Baseline inconsistent with feeding and increased spillage.    Time 12    Period Weeks    Status New    Target Date 12/10/19      Long Term Additional Goals   Additional Long Term Goals Yes      OT LONG TERM GOAL #6   Title Patient will increase right UE ROM to comb the back of her hair with modified independence.    Baseline can comb the sides but not the back    Time 12    Period Weeks    Status New    Target Date 12/10/19                 Plan - 09/24/19 1729    Clinical Impression Statement Pt denies pain throughout session. Tolerated treatment well this date and pt pleased with her progress and reports looking forward to next session.    OT Occupational Profile and History Detailed Assessment- Review of Records and additional review of physical, cognitive, psychosocial history related to current functional performance    Occupational performance deficits (Please refer to evaluation for details): ADL's;IADL's;Leisure;Social Participation    Body Structure / Function / Physical Skills ADL;Continence;Dexterity;Flexibility;Strength;ROM;Balance;Coordination;FMC;IADL;Endurance;UE functional use;Decreased knowledge of use of DME;GMC    Psychosocial Skills Environmental  Adaptations;Habits;Routines and Behaviors    Rehab Potential Fair    Clinical Decision Making Limited treatment options, no task modification necessary    Comorbidities Affecting Occupational Performance: Presence of comorbidities impacting occupational performance    Comorbidities impacting occupational performance description: contractures of bilateral hands, dependent transfers,    OT Frequency 2x / week    OT Duration 12 weeks    OT Treatment/Interventions Self-care/ADL  training;Cryotherapy;Therapeutic exercise;DME and/or AE instruction;Balance training;Neuromuscular education;Manual Therapy;Splinting;Moist Heat;Passive range of motion;Therapeutic activities;Patient/family education    Consulted and Agree with Plan of Care Patient           Patient will benefit from skilled therapeutic intervention in order to improve the following deficits and impairments:   Body Structure / Function / Physical Skills: ADL, Continence, Dexterity, Flexibility, Strength, ROM, Balance, Coordination, FMC, IADL, Endurance, UE functional use, Decreased knowledge  of use of DME, GMC   Psychosocial Skills: Environmental  Adaptations, Habits, Routines and Behaviors   Visit Diagnosis: Central cord syndrome, sequela ([facility])  Muscle weakness (generalized)    Problem List Patient Active Problem List   Diagnosis Date Noted  . Acute appendicitis with appendiceal abscess 05/30/2018  . Hypocalcemia 02/15/2018  . Dysuria 02/15/2018  . Bilateral lower extremity edema 02/11/2018  . Vaginal yeast infection 01/30/2018  . Chest tightness 01/27/2018  . Healthcare-associated pneumonia 01/25/2018  . Pleural effusion, not elsewhere classified 01/25/2018  . Acute deep vein thrombosis (DVT) of left lower extremity ([place]) 01/24/2018  . Acute deep vein thrombosis (DVT) of right lower extremity ([place]) 01/24/2018  . Chronic allergic rhinitis 01/24/2018  . Depression with anxiety 01/24/2018  . UTI due to Klebsiella species 01/24/2018  . Protein-calorie malnutrition, severe ([place]) 01/24/2018  . S/P insertion of IVC (inferior vena caval) filter 01/24/2018  . Reactive depression   . Hypokalemia   . Leukocytosis   . Essential hypertension   . Trauma   . 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([place])   . Neuropathic pain   . Neurogenic bowel   . Neurogenic bladder   . Benign essential HTN   . Acute blood loss anemia   . Central cord syndrome at C6 level of cervical spinal cord ([place]) 11/29/2017  . Central cord syndrome  [facility]) 11/29/2017    Corky Sox, OTR/L 09/24/2019, 5:33 PM  [place] MAIN [facility] SERVICES [address] [place], [place], [address] Phone: [phone]   Fax:  [phone]  Name: TOBY AYAD MRN: [id number] Date of Birth: 1935-01-18

## 2019-09-27 ENCOUNTER — Other Ambulatory Visit: Payer: Self-pay

## 2019-09-27 ENCOUNTER — Ambulatory Visit: Payer: Medicare HMO | Admitting: Physical Therapy

## 2019-09-27 ENCOUNTER — Encounter: Payer: Self-pay | Admitting: Physical Therapy

## 2019-09-27 DIAGNOSIS — S14129S Central cord syndrome at unspecified level of cervical spinal cord, sequela: Secondary | ICD-10-CM

## 2019-09-27 DIAGNOSIS — M6281 Muscle weakness (generalized): Secondary | ICD-10-CM

## 2019-09-27 DIAGNOSIS — R2689 Other abnormalities of gait and mobility: Secondary | ICD-10-CM

## 2019-09-27 DIAGNOSIS — R278 Other lack of coordination: Secondary | ICD-10-CM

## 2019-09-27 DIAGNOSIS — R140 Abdominal distension (gaseous): Secondary | ICD-10-CM

## 2019-09-27 DIAGNOSIS — O2230 Deep phlebothrombosis in pregnancy, unspecified trimester: Secondary | ICD-10-CM

## 2019-09-27 NOTE — Therapy (Signed)
[place] MA[facility] SERVICES [address] [place], [place], [address] Phone: [phone]   Fax:  [phone]  Physical Therapy Treatment  Patient Details  Name: Sherry [name] MRN: [id number] Date of Birth: [birth year] Referring Provider (PT): Viviana Simpler   Encounter Date: 09/27/2019   PT End of Session - 09/27/19 1606    Visit Number 8    Number of Visits 16    Date for PT Re-Evaluation 10/16/19    Authorization Type aetna medicare FOTO performed by PT on eval (7/20), score 12    PT Start Time 0230    PT Stop Time 0330    PT Time Calculation (min) 60 min    Equipment Utilized During Treatment Gait belt;Other (comment)   hoyer lift   Activity Tolerance Patient tolerated treatment well    Behavior During Therapy WFL for tasks assessed/performed           Past Medical History:  Diagnosis Date   Acute blood loss anemia    Arthritis    Cancer ([place])    skin   Central cord syndrome at C6 level of cervical spinal cord ([place]) 11/29/2017   Hypertension    Protein-calorie malnutrition, severe ([place]) 01/24/2018   S/P insertion of IVC (inferior vena caval) filter 01/24/2018   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([place])     Past Surgical History:  Procedure Laterality Date   ANTERIOR CERVICAL DECOMP/DISCECTOMY FUSION N/A 11/29/2017   Procedure: Cervical five-six, six-seven Anterior Cervical Decompression Fusion;  Surgeon: Judith Part, MD;  Location: [place];  Service: Neurosurgery;  Laterality: N/A;  Cervical five-six, six-seven Anterior Cervical Decompression Fusion   CATARACT EXTRACTION     IR IVC FILTER PLMT / S&I /IMG GUID/MOD SED  12/08/2017    There were no vitals filed for this visit.   Subjective Assessment - 09/27/19 1606    Subjective Patient stated that she is doing well today, performed HEP.    Patient is accompained by: --   aide   Pertinent History Pt is an 85 yo female that fell in 2019, fractured vertebrae in her neck and  in her low back per family. 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 After hospital stay, pt discharged to [facility] for ~1 month, experienced severe UTI as well as bilateral DVT (IVC filter placed). Discharged to [facility], stayed for about a year, and then moved to [facility] in April 2021. Was receiving PT, but per family facility reported that did not have adequate equipment to maximize PT for her. Pt until about 1 month ago was practicing sit to stand transfers with 1-2 people, and working on static standing. Has a brace for L foot due to PF. Pt currently needs assistance with all ADLs (able to assist with donning/doffing her shirt), bed baths, and needs a hoyer lift for transfers. Able to drive power wheelchair without assistance.    Limitations Standing;Walking;House hold activities;Lifting    How long can you sit comfortably? NA    How long can you stand comfortably? unable    How long can you walk comfortably? unable    Patient Stated Goals "to stand up and walk"    Currently in Pain? No/denies    Multiple Pain Sites No           Treatment:  Hoyer lift from power wc <>mat  Rolling left and right with mod assist x 1  Supine to sit with mod assist x 3 reps Sit <>stand with light gait and  standing x 20 mins x 1, 5 mins x 1 locking and unlocking left and right knee , L heel is not able to touch the floor  Reviewed HEP  .Patient performed with instruction, verbal cues, tactile cues of therapist: goal:increase tissue extensibility, promote proper posture, improve mobility                          PT Education - 09/27/19 1606    Education Details HEP    Person(s) Educated Patient    Methods Explanation    Comprehension Verbalized understanding            PT Short Term Goals - 08/21/19 1515      PT SHORT TERM GOAL #1   Title The patient will perform initial HEP with minimum assistance in order to improve strength and function.    Time 4     Period Weeks    Status New    Target Date 09/18/19             PT Long Term Goals - 08/21/19 1515      PT LONG TERM GOAL #1   Title The patient will be compliant with finalized HEP with minimum assistance in preparation for self management and maintenance of condition.    Time 8    Period Weeks    Status New    Target Date 10/16/19      PT LONG TERM GOAL #2   Title The patient will demonstrate at least 10 point improvement on FOTO score indicating an improved ability to perform functional activities.    Baseline on eval (/720) score 12    Time 8    Period Weeks    Status New    Target Date 10/16/19      PT LONG TERM GOAL #3   Title The patient will demonstrate lateral scooting  for assistance with transfers with minA to maximize independence.    Time 8    Period Weeks    Status New    Target Date 10/16/19      PT LONG TERM GOAL #4   Title The patient will demonstrate a squat pivot transfer with minimum assistance to maximize independence and mobility.    Time 8    Period Weeks    Status New    Target Date 10/16/19      PT LONG TERM GOAL #5   Title The patient will demonstrate sitting without UE support for 2-5 minutes at EOB to improve participation and maximize independence with ADLs.    Time 8    Period Weeks    Status New    Target Date 10/16/19                 Plan - 09/27/19 1607    Clinical Impression Statement Patient demonstrates good motivation throughout today's session. Patient was able to stand  with light gait and moderate fatigue induced, required one rest break.She also demonstrated difficulty with standing with light gait, with frequent UE support and posterior LOB due to flexed posture, improved with cues to stand tall.. Patient would continue to benefit from skilled PT to address the deficits outlined in this note    Personal Factors and Comorbidities Age;Time since onset of injury/illness/exacerbation;Comorbidity 3+;Fitness     Comorbidities HTN, quadriparesis, history of DVT, neurogenic bladder    Examination-Activity Limitations Bathing;Hygiene/Grooming;Squat;Bed Mobility;Lift;Bend;Stand;Engineer, manufacturing;Toileting;Self Feeding;Transfers;Continence;Sit;Dressing;Sleep;Carry    Examination-Participation Restrictions Church;Laundry;Cleaning;Medication Management;Community Activity;Meal Prep;Interpersonal Relationship  Stability/Clinical Decision Making Evolving/Moderate complexity    Rehab Potential Fair    PT Frequency 2x / week    PT Duration 8 weeks    PT Treatment/Interventions ADLs/Self Care Home Management;Electrical Stimulation;Therapeutic activities;Wheelchair mobility training;Vasopneumatic Device;Joint Manipulations;Vestibular;Passive range of motion;Patient/family education;Therapeutic exercise;DME Instruction;Biofeedback;Aquatic Therapy;Moist Heat;Gait training;Balance training;Orthotic Fit/Training;Dry needling;Energy conservation;Taping;Splinting;Neuromuscular re-education;Cryotherapy;Ultrasound;Functional mobility training    PT Next Visit Plan platform attachments for RW to assess standing? transfers, sitting balance/endurance    PT Home Exercise Plan sitting balance unsupported hold 30-45sec with aide    Consulted and Agree with Plan of Care Patient           Patient will benefit from skilled therapeutic intervention in order to improve the following deficits and impairments:  Abnormal gait, Decreased balance, Decreased endurance, Decreased mobility, Difficulty walking, Hypomobility, Impaired sensation, Decreased range of motion, Improper body mechanics, Impaired perceived functional ability, Decreased activity tolerance, Decreased knowledge of use of DME, Decreased safety awareness, Decreased strength, Impaired flexibility, Impaired UE functional use, Postural dysfunction  Visit Diagnosis: Central cord syndrome, sequela (HCC)  Muscle weakness (generalized)  Other abnormalities of gait  and mobility  Abdominal distention  Other lack of coordination  DVT (deep vein thrombosis) in pregnancy     Problem List Patient Active Problem List   Diagnosis Date Noted   Acute appendicitis with appendiceal abscess 05/30/2018   Hypocalcemia 02/15/2018   Dysuria 02/15/2018   Bilateral lower extremity edema 02/11/2018   Vaginal yeast infection 01/30/2018   Chest tightness 01/27/2018   Healthcare-associated pneumonia 01/25/2018   Pleural effusion, not elsewhere classified 01/25/2018   Acute deep vein thrombosis (DVT) of left lower extremity ([place]) 01/24/2018   Acute deep vein thrombosis (DVT) of right lower extremity ([place]) 01/24/2018   Chronic allergic rhinitis 01/24/2018   Depression with anxiety 01/24/2018   UTI due to Klebsiella species 01/24/2018   Protein-calorie malnutrition, severe ([place]) 01/24/2018   S/P insertion of IVC (inferior vena caval) filter 01/24/2018   Reactive depression    Hypokalemia    Leukocytosis    Essential hypertension    Trauma    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(H[facility])    Neuropathic pain    Neurogenic bowel    Neurogenic bladder    Benign essential HTN    Acute blood loss anemia    Central cord syndrome at C6 level of cervical spinal cord ([place]) 11/29/2017   Central cord syndrome ([place]) 11/29/2017    Alanson Puls, PT DPT 09/27/2019, 4:11 PM  [place] [facility] SERVICES [place], [place], [address] Phone: [phone]   Fax:  [phone]  Name: Sherry [name] MRN: [id number] Date of Birth: 03-19-1934

## 2019-10-01 ENCOUNTER — Ambulatory Visit: Payer: Medicare HMO | Admitting: Physical Therapy

## 2019-10-01 ENCOUNTER — Ambulatory Visit: Payer: Medicare HMO | Admitting: Occupational Therapy

## 2019-10-03 ENCOUNTER — Other Ambulatory Visit: Payer: Self-pay

## 2019-10-03 ENCOUNTER — Ambulatory Visit: Payer: Medicare HMO | Admitting: Occupational Therapy

## 2019-10-03 ENCOUNTER — Ambulatory Visit: Payer: Medicare HMO | Attending: Internal Medicine | Admitting: Physical Therapy

## 2019-10-03 DIAGNOSIS — S14129S Central cord syndrome at unspecified level of cervical spinal cord, sequela: Secondary | ICD-10-CM | POA: Diagnosis present

## 2019-10-03 DIAGNOSIS — R140 Abdominal distension (gaseous): Secondary | ICD-10-CM

## 2019-10-03 DIAGNOSIS — R2689 Other abnormalities of gait and mobility: Secondary | ICD-10-CM | POA: Insufficient documentation

## 2019-10-03 DIAGNOSIS — M6281 Muscle weakness (generalized): Secondary | ICD-10-CM | POA: Diagnosis present

## 2019-10-03 DIAGNOSIS — R278 Other lack of coordination: Secondary | ICD-10-CM

## 2019-10-03 DIAGNOSIS — O2230 Deep phlebothrombosis in pregnancy, unspecified trimester: Secondary | ICD-10-CM

## 2019-10-04 ENCOUNTER — Encounter: Payer: Self-pay | Admitting: Occupational Therapy

## 2019-10-04 ENCOUNTER — Encounter: Payer: Self-pay | Admitting: Physical Therapy

## 2019-10-04 NOTE — Therapy (Signed)
[place] MAIN [facility] SERVICES [address] [place], [place], [address] Phone: [phone]   Fax:  [phone]  Occupational Therapy Treatment  Patient Details  Name: Sherry [name] MRN: [id number] Date of Birth: Jun 20, 1934 No data recorded  Encounter Date: 10/03/2019   OT End of Session - 10/04/19 1225    Visit Number 3    Number of Visits 24    Date for OT Re-Evaluation 12/10/19    OT Start Time 1400    OT Stop Time 1445    OT Time Calculation (min) 45 min    Activity Tolerance Patient tolerated treatment well    Behavior During Therapy [facility] for tasks assessed/performed           Past Medical History:  Diagnosis Date  . Acute blood loss anemia   . Arthritis   . Cancer ([place])    skin  . Central cord syndrome at C6 level of cervical spinal cord ([place]) 11/29/2017  . Hypertension   . Protein-calorie malnutrition, severe ([place]) 01/24/2018  . S/P insertion of IVC (inferior vena caval) filter 01/24/2018  . 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[facility])     Past Surgical History:  Procedure Laterality Date  . ANTERIOR CERVICAL DECOMP/DISCECTOMY FUSION N/A 11/29/2017   Procedure: Cervical five-six, six-seven Anterior Cervical Decompression Fusion;  Surgeon: Judith Part, MD;  Location: [place];  Service: Neurosurgery;  Laterality: N/A;  Cervical five-six, six-seven Anterior Cervical Decompression Fusion  . CATARACT EXTRACTION    . IR IVC FILTER PLMT / S&I /IMG GUID/MOD SED  12/08/2017    There were no vitals filed for this visit.   Subjective Assessment - 10/04/19 1224    Subjective  Patient reports she feels her ROM in her hands has improved slightly. Has been working on moving them more actively during the day.    Pertinent History Patient s/p fall November 29, 2017 resulting in diagnosis of central cord syndrome at C 6 level.  She has had therapy in multiple venues but with recent move to [facility], therapy staff recommended she seek outpatient therapy for  her needs.    Patient Stated Goals Patient would like to be able to move better in bed, stand and perform self care tasks.    Multiple Pain Sites No           Patient seen this date for focus on bilateral UE ROM, strengthening and functional movement patterns.   PROM to bilateral UEs for shoulder flexion, ABD, ER, elbow flexion/extension, forearm supination/pronation, wrist flexion/ext, prolonged stretching to digits at all joints, MP, PIP and  DIP.  A/AROM for all the above movements in available ranges.  Patient has decreased shoulder movement on the right compared to left.  Left UE exercises with 1.5# weighted wrist cuff for 10 reps each.  Forward reaching patterns, lateral trunk flexion also performed for 10 reps.  Advised patient to avoid wrist weights on right with decreased ROM, will work towards improving motion first before adding weights back into routine for right UE.  Active finger flexion extension in available ranges, patient with several joints with contractures in extension.  Denies any pain this date.    Response to tx:   Patient denies any pain with exercises, continues to demonstrate significant decreases in active range of motion of bilateral UEs and especially her hands.  Presence of contractures in extension for several joints of the fingers which limit her hand function.  Decreased ROM in right shoulder, patient reports using wrist weights  at home, advised her to just use wrist weights for left UE, do not perform with right until she achieves greater ROM.  Continue to work towards goals in plan of care to impact BUE ROM, strength and functional use in daily tasks.                       OT Education - 10/04/19 1225    Education Details PROM, use of weights, HEP    Person(s) Educated Patient;Caregiver(s)    Methods Explanation;Demonstration    Comprehension Verbalized understanding;Need further instruction               OT Long Term Goals - 09/21/19  0929      OT LONG TERM GOAL #1   Title Patient and caregiver will demonstrate understanding of home exercise program for ROM.    Baseline no current program at eval    Time 6    Period Weeks    Status New    Target Date 10/29/19      OT LONG TERM GOAL #2   Title Patient will demonstrate ability to don shirt with min assist from seated position.    Baseline max at eval    Time 6    Period Weeks    Status New    Target Date 10/29/19      OT LONG TERM GOAL #3   Title Patient will demonstrate improved sitting balance at the edge of the bed to participate in self care tasks.    Baseline requires assist at eval    Time 12    Period Weeks    Status New    Target Date 12/10/19      OT LONG TERM GOAL #4   Title Patient will demonstrate improved composite finger flexion to hold deodorant to apply to underarms.    Baseline unable at eval    Time 12    Period Weeks    Status New    Target Date 12/10/19      OT LONG TERM GOAL #5   Title Pt will complete self feeding with setup, use of universal cuff and minimal spillage.    Baseline inconsistent with feeding and increased spillage.    Time 12    Period Weeks    Status New    Target Date 12/10/19      Long Term Additional Goals   Additional Long Term Goals Yes      OT LONG TERM GOAL #6   Title Patient will increase right UE ROM to comb the back of her hair with modified independence.    Baseline can comb the sides but not the back    Time 12    Period Weeks    Status New    Target Date 12/10/19                 Plan - 10/04/19 1226    Clinical Impression Statement Patient denies any pain with exercises, continues to demonstrate significant decreases in active range of motion of bilateral UEs and especially her hands.  Presence of contractures in extension for several joints of the fingers which limit her hand function.  Decreased ROM in right shoulder, patient reports using wrist weights at home, advised her to just  use wrist weights for left UE, do not perform with right until she achieves greater ROM.  Continue to work towards goals in plan of care to impact BUE ROM, strength and functional use in  daily tasks.    OT Occupational Profile and History Detailed Assessment- Review of Records and additional review of physical, cognitive, psychosocial history related to current functional performance    Occupational performance deficits (Please refer to evaluation for details): ADL's;IADL's;Leisure;Social Participation    Body Structure / Function / Physical Skills ADL;Continence;Dexterity;Flexibility;Strength;ROM;Balance;Coordination;FMC;IADL;Endurance;UE functional use;Decreased knowledge of use of DME;GMC    Psychosocial Skills Environmental  Adaptations;Habits;Routines and Behaviors    Rehab Potential Fair    Clinical Decision Making Limited treatment options, no task modification necessary    Comorbidities Affecting Occupational Performance: Presence of comorbidities impacting occupational performance    Comorbidities impacting occupational performance description: contractures of bilateral hands, dependent transfers,    OT Frequency 2x / week    OT Duration 12 weeks    OT Treatment/Interventions Self-care/ADL training;Cryotherapy;Therapeutic exercise;DME and/or AE instruction;Balance training;Neuromuscular education;Manual Therapy;Splinting;Moist Heat;Passive range of motion;Therapeutic activities;Patient/family education    Consulted and Agree with Plan of Care Patient           Patient will benefit from skilled therapeutic intervention in order to improve the following deficits and impairments:   Body Structure / Function / Physical Skills: ADL, Continence, Dexterity, Flexibility, Strength, ROM, Balance, Coordination, FMC, IADL, Endurance, UE functional use, Decreased knowledge of use of DME, GMC   Psychosocial Skills: Environmental  Adaptations, Habits, Routines and Behaviors   Visit  Diagnosis: Muscle weakness (generalized)  Central cord syndrome, sequela (HCC)  Other lack of coordination    Problem List Patient Active Problem List   Diagnosis Date Noted  . Acute appendicitis with appendiceal abscess 05/30/2018  . Hypocalcemia 02/15/2018  . Dysuria 02/15/2018  . Bilateral lower extremity edema 02/11/2018  . Vaginal yeast infection 01/30/2018  . Chest tightness 01/27/2018  . Healthcare-associated pneumonia 01/25/2018  . Pleural effusion, not elsewhere classified 01/25/2018  . Acute deep vein thrombosis (DVT) of left lower extremity ([place]) 01/24/2018  . Acute deep vein thrombosis (DVT) of right lower extremity ([place]) 01/24/2018  . Chronic allergic rhinitis 01/24/2018  . Depression with anxiety 01/24/2018  . UTI due to Klebsiella species 01/24/2018  . Protein-calorie malnutrition, severe ([employer]) 01/24/2018  . S/P insertion of IVC (inferior vena caval) filter 01/24/2018  . Reactive depression   . Hypokalemia   . Leukocytosis   . Essential hypertension   . Trauma   . 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([place])   . Neuropathic pain   . Neurogenic bowel   . Neurogenic bladder   . Benign essential HTN   . Acute blood loss anemia   . Central cord syndrome at C6 level of cervical spinal cord ([place]) 11/29/2017  . Central cord syndrome [facility]) 11/29/2017   Arali Somera T Tomasita Morrow, OTR/L, CLT  Cherl Gorney 10/04/2019, 12:41 PM  [place] [facility]IN [facility] SERVICES [address] [place], [place], [address] Phone: [phone]   Fax:  [phone]  Name: SAIDI SANTACROCE MRN: [id number] Date of Birth: [birth year]

## 2019-10-04 NOTE — Therapy (Signed)
[place] MAIN [facility] SERVICES [address] [place], [place], [address] Phone: [phone]   Fax:  [phone]  Physical Therapy Treatment  Patient Details  Name: Sherry [name] MRN: [id number] Date of Birth: Mar 11, 1934 Referring Provider (PT): Viviana Simpler   Encounter Date: 10/03/2019   PT End of Session - 10/04/19 0949    Visit Number 9    Number of Visits 16    Date for PT Re-Evaluation 10/16/19    Authorization Type aetna medicare FOTO performed by PT on eval (7/20), score 12    PT Start Time 0245    PT Stop Time 0345    PT Time Calculation (min) 60 min    Equipment Utilized During Treatment Gait belt;Other (comment)   hoyer lift   Activity Tolerance Patient tolerated treatment well    Behavior During Therapy WFL for tasks assessed/performed           Past Medical History:  Diagnosis Date  . Acute blood loss anemia   . Arthritis   . Cancer ([place])    skin  . Central cord syndrome at C6 level of cervical spinal cord ([place]) 11/29/2017  . Hypertension   . Protein-calorie malnutrition, severe ([place]) 01/24/2018  . S/P insertion of IVC (inferior vena caval) filter 01/24/2018  . 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[facility])     Past Surgical History:  Procedure Laterality Date  . ANTERIOR CERVICAL DECOMP/DISCECTOMY FUSION N/A 11/29/2017   Procedure: Cervical five-six, six-seven Anterior Cervical Decompression Fusion;  Surgeon: Judith Part, MD;  Location: [place];  Service: Neurosurgery;  Laterality: N/A;  Cervical five-six, six-seven Anterior Cervical Decompression Fusion  . CATARACT EXTRACTION    . IR IVC FILTER PLMT / S&I /IMG GUID/MOD SED  12/08/2017    There were no vitals filed for this visit.   Subjective Assessment - 10/04/19 0948    Subjective Patient stated that she is doing well today, performed HEP.    Patient is accompained by: --   aide   Pertinent History Pt is an 85 yo female that fell in 2019, fractured vertebrae in her neck and in  her low back per family. 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 After hospital stay, pt discharged to [facility] for ~1 month, experienced severe UTI as well as bilateral DVT (IVC filter placed). Discharged to [facility], stayed for about a year, and then moved to [facility] in April 2021. Was receiving PT, but per family facility reported that did not have adequate equipment to maximize PT for her. Pt until about 1 month ago was practicing sit to stand transfers with 1-2 people, and working on static standing. Has a brace for L foot due to PF. Pt currently needs assistance with all ADLs (able to assist with donning/doffing her shirt), bed baths, and needs a hoyer lift for transfers. Able to drive power wheelchair without assistance.    Limitations Standing;Walking;House hold activities;Lifting    How long can you sit comfortably? NA    How long can you stand comfortably? unable    How long can you walk comfortably? unable    Patient Stated Goals "to stand up and walk"    Currently in Pain? No/denies    Multiple Pain Sites No           Treatment:  Hoyer lift from power wc <>mat  Rolling left and right with mod assist x 1  Supine to sit with mod assist x 3 reps Sit <>stand with light gait and  standing x 25 mins x 1,10 mins x 1 , locking and unlocking left and right knee , L heel is not able to touch the floor  Reviewed HEP  Patient performed with instruction, verbal cues, tactile cues of therapist: goal:increase tissue extensibility, promote proper posture, improve mobility                           PT Education - 10/04/19 0949    Education Details HEP    Person(s) Educated Patient    Methods Explanation    Comprehension Verbalized understanding;Need further instruction;Returned demonstration;Verbal cues required;Tactile cues required            PT Short Term Goals - 08/21/19 1515      PT SHORT TERM GOAL #1   Title The patient will perform  initial HEP with minimum assistance in order to improve strength and function.    Time 4    Period Weeks    Status New    Target Date 09/18/19             PT Long Term Goals - 08/21/19 1515      PT LONG TERM GOAL #1   Title The patient will be compliant with finalized HEP with minimum assistance in preparation for self management and maintenance of condition.    Time 8    Period Weeks    Status New    Target Date 10/16/19      PT LONG TERM GOAL #2   Title The patient will demonstrate at least 10 point improvement on FOTO score indicating an improved ability to perform functional activities.    Baseline on eval (/720) score 12    Time 8    Period Weeks    Status New    Target Date 10/16/19      PT LONG TERM GOAL #3   Title The patient will demonstrate lateral scooting  for assistance with transfers with minA to maximize independence.    Time 8    Period Weeks    Status New    Target Date 10/16/19      PT LONG TERM GOAL #4   Title The patient will demonstrate a squat pivot transfer with minimum assistance to maximize independence and mobility.    Time 8    Period Weeks    Status New    Target Date 10/16/19      PT LONG TERM GOAL #5   Title The patient will demonstrate sitting without UE support for 2-5 minutes at EOB to improve participation and maximize independence with ADLs.    Time 8    Period Weeks    Status New    Target Date 10/16/19                 Plan - 10/04/19 0950    Clinical Impression Statement Patient performs standing in light gait with weight shifting left and right x 25 ins and 10 mins. She is not able to lift either extremity to begin stepping pattern. She is having less fatigue and is able to stand or longer periods of time. She is able to sit unsupported at edgo of mat, and needs mod assist supine to sit bed mobility. Patient will contiue to benefit from skilled PT to improve mobility and strength.    Personal Factors and Comorbidities  Age;Time since onset of injury/illness/exacerbation;Comorbidity 3+;Fitness    Comorbidities HTN, quadriparesis, history of DVT, neurogenic bladder  Examination-Activity Limitations Bathing;Hygiene/Grooming;Squat;Bed Mobility;Lift;Bend;Stand;Engineer, manufacturing;Toileting;Self Feeding;Transfers;Continence;Sit;Dressing;Sleep;Carry    Examination-Participation Restrictions Church;Laundry;Cleaning;Medication Management;Community Activity;Meal Prep;Interpersonal Relationship    Stability/Clinical Decision Making Evolving/Moderate complexity    Rehab Potential Fair    PT Frequency 2x / week    PT Duration 8 weeks    PT Treatment/Interventions ADLs/Self Care Home Management;Electrical Stimulation;Therapeutic activities;Wheelchair mobility training;Vasopneumatic Device;Joint Manipulations;Vestibular;Passive range of motion;Patient/family education;Therapeutic exercise;DME Instruction;Biofeedback;Aquatic Therapy;Moist Heat;Gait training;Balance training;Orthotic Fit/Training;Dry needling;Energy conservation;Taping;Splinting;Neuromuscular re-education;Cryotherapy;Ultrasound;Functional mobility training    PT Next Visit Plan platform attachments for RW to assess standing? transfers, sitting balance/endurance    PT Home Exercise Plan sitting balance unsupported hold 30-45sec with aide    Consulted and Agree with Plan of Care Patient           Patient will benefit from skilled therapeutic intervention in order to improve the following deficits and impairments:  Abnormal gait, Decreased balance, Decreased endurance, Decreased mobility, Difficulty walking, Hypomobility, Impaired sensation, Decreased range of motion, Improper body mechanics, Impaired perceived functional ability, Decreased activity tolerance, Decreased knowledge of use of DME, Decreased safety awareness, Decreased strength, Impaired flexibility, Impaired UE functional use, Postural dysfunction  Visit Diagnosis: Central cord  syndrome, sequela (HCC)  Muscle weakness (generalized)  Other abnormalities of gait and mobility  Abdominal distention  DVT (deep vein thrombosis) in pregnancy  Other lack of coordination     Problem List Patient Active Problem List   Diagnosis Date Noted  . Acute appendicitis with appendiceal abscess 05/30/2018  . Hypocalcemia 02/15/2018  . Dysuria 02/15/2018  . Bilateral lower extremity edema 02/11/2018  . Vaginal yeast infection 01/30/2018  . Chest tightness 01/27/2018  . Healthcare-associated pneumonia 01/25/2018  . Pleural effusion, not elsewhere classified 01/25/2018  . Acute deep vein thrombosis (DVT) of left lower extremity ([place]) 01/24/2018  . Acute deep vein thrombosis (DVT) of right lower extremity ([place]) 01/24/2018  . Chronic allergic rhinitis 01/24/2018  . Depression with anxiety 01/24/2018  . UTI due to Klebsiella species 01/24/2018  . Protein-calorie malnutrition, severe ([place]) 01/24/2018  . S/P insertion of IVC (inferior vena caval) filter 01/24/2018  . Reactive depression   . Hypokalemia   . Leukocytosis   . Essential hypertension   . Trauma   . 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([place])   . Neuropathic pain   . Neurogenic bowel   . Neurogenic bladder   . Benign essential HTN   . Acute blood loss anemia   . Central cord syndrome at C6 level of cervical spinal cord ([place]) 11/29/2017  . Central cord syndrome [facility]) 11/29/2017    Alanson Puls, PT DPT 10/04/2019, 9:54 AM  [place] MAIN [facility] SERVICES [address] [place], [place], [address] Phone: [phone]   Fax:  [phone]  Name: Sherry [name] MRN: [id number] Date of Birth: [birth year]

## 2019-10-09 ENCOUNTER — Other Ambulatory Visit: Payer: Self-pay

## 2019-10-09 ENCOUNTER — Ambulatory Visit: Payer: Medicare HMO | Admitting: Occupational Therapy

## 2019-10-09 ENCOUNTER — Encounter: Payer: Self-pay | Admitting: Occupational Therapy

## 2019-10-09 DIAGNOSIS — R278 Other lack of coordination: Secondary | ICD-10-CM

## 2019-10-09 DIAGNOSIS — S14129S Central cord syndrome at unspecified level of cervical spinal cord, sequela: Secondary | ICD-10-CM

## 2019-10-09 DIAGNOSIS — M6281 Muscle weakness (generalized): Secondary | ICD-10-CM

## 2019-10-09 DIAGNOSIS — B019 Varicella without complication: Secondary | ICD-10-CM | POA: Diagnosis not present

## 2019-10-09 NOTE — Therapy (Signed)
[place] MAIN [facility] SERVICES [address] [place], [place], [address] Phone: [phone]   Fax:  [phone]  Occupational Therapy Treatment  Patient Details  Name: Sherry [name] MRN: [id number] Date of Birth: 02-23-1934 No data recorded  Encounter Date: 10/09/2019   OT End of Session - 10/11/19 0955    Visit Number 4    Number of Visits 24    Date for OT Re-Evaluation 12/10/19    OT Start Time 1400    OT Stop Time 1450    OT Time Calculation (min) 50 min    Activity Tolerance Patient tolerated treatment well    Behavior During Therapy [facility] for tasks assessed/performed           Past Medical History:  Diagnosis Date  . Acute blood loss anemia   . Arthritis   . Cancer ([place])    skin  . Central cord syndrome at C6 level of cervical spinal cord ([place]) 11/29/2017  . Hypertension   . Protein-calorie malnutrition, severe ([place]) 01/24/2018  . S/P insertion of IVC (inferior vena caval) filter 01/24/2018  . 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[facility])     Past Surgical History:  Procedure Laterality Date  . ANTERIOR CERVICAL DECOMP/DISCECTOMY FUSION N/A 11/29/2017   Procedure: Cervical five-six, six-seven Anterior Cervical Decompression Fusion;  Surgeon: Judith Part, MD;  Location: [place];  Service: Neurosurgery;  Laterality: N/A;  Cervical five-six, six-seven Anterior Cervical Decompression Fusion  . CATARACT EXTRACTION    . IR IVC FILTER PLMT / S&I /IMG GUID/MOD SED  12/08/2017    There were no vitals filed for this visit.   Subjective Assessment - 10/11/19 0954    Subjective  Patient reports she was happy to see her daughter finally, she has been helping to care for some family members in another state and then had to quarantine when she returned.  She was able to get her protein shake again.    Pertinent History Patient s/p fall November 29, 2017 resulting in diagnosis of central cord syndrome at C 6 level.  She has had therapy in multiple venues but  with recent move to [facility], therapy staff recommended she seek outpatient therapy for her needs.    Patient Stated Goals Patient would like to be able to move better in bed, stand and perform self care tasks.    Currently in Pain? No/denies    Multiple Pain Sites No           Patient reports her daughter is now back in town.  Pt reports her exercises at the nursing home have been inconsistent since they have not had a consistent restorative person to come to see her.  Discussed the use of her personal care attendant to assist with exercises and ROM on a daily basis to be consistent with program, they both agree.   Patient seen this date for focus on bilateral UE ROM, strengthening and functional movement patterns.  Caregiver present during session and reports she will assist patient with exercises at home.   PROM to bilateral UEs for shoulder flexion, ABD, ER, elbow flexion/extension, forearm supination/pronation, wrist flexion/ext, prolonged stretching to digits at all joints, MP, PIP and  DIP.  A/AROM for all the above movements in available ranges.  Patient continues to demonstrate decreased shoulder movement on the right compared to left but improved from last session, able to achieve about 80 degrees of shoulder flexion on the right today.   Left UE exercises with 1.5# weighted wrist  cuff for 10 reps each.  Forward reaching patterns, lateral trunk flexion also performed for 10 reps.    Active finger flexion and extension in available ranges, patient with several joints with contractures in extension.    Response to tx:   Patient participates in a restorative program at the health center however, she reports it has not been consistent over the last week.  Recommend she have her daily aides to help with her ROM and HEP to impact improvements in these areas.  Patient continues to demonstrate decreased ROM on the right UE versus left however this improved from last session and was able to  demonstrate 80 degrees of shoulder flexion, continue to hold off on weighted exercises on the right but can perform on the left side.  Bilateral hands demonstrating slight improvements of active and passive movements but still demonstrates difficulty with being able to hold objects and use hands for functional tasks.  Continue OT to improve ROM, strength and increased active participation in necessary daily activities.                          OT Long Term Goals - 09/21/19 2119      OT LONG TERM GOAL #1   Title Patient and caregiver will demonstrate understanding of home exercise program for ROM.    Baseline no current program at eval    Time 6    Period Weeks    Status New    Target Date 10/29/19      OT LONG TERM GOAL #2   Title Patient will demonstrate ability to don shirt with min assist from seated position.    Baseline max at eval    Time 6    Period Weeks    Status New    Target Date 10/29/19      OT LONG TERM GOAL #3   Title Patient will demonstrate improved sitting balance at the edge of the bed to participate in self care tasks.    Baseline requires assist at eval    Time 12    Period Weeks    Status New    Target Date 12/10/19      OT LONG TERM GOAL #4   Title Patient will demonstrate improved composite finger flexion to hold deodorant to apply to underarms.    Baseline unable at eval    Time 12    Period Weeks    Status New    Target Date 12/10/19      OT LONG TERM GOAL #5   Title Pt will complete self feeding with setup, use of universal cuff and minimal spillage.    Baseline inconsistent with feeding and increased spillage.    Time 12    Period Weeks    Status New    Target Date 12/10/19      Long Term Additional Goals   Additional Long Term Goals Yes      OT LONG TERM GOAL #6   Title Patient will increase right UE ROM to comb the back of her hair with modified independence.    Baseline can comb the sides but not the back    Time  12    Period Weeks    Status New    Target Date 12/10/19                 Plan - 10/11/19 0956    Clinical Impression Statement Patient participates in a restorative program  at the health center however, she reports it has not been consistent over the last week.  Recommend she have her daily aides to help with her ROM and HEP to impact improvements in these areas.  Patient continues to demonstrate decreased ROM on the right UE versus left however this improved from last session and was able to demonstrate 80 degrees of shoulder flexion, continue to hold off on weighted exercises on the right but can perform on the left side.  Bilateral hands demonstrating slight improvements of active and passive movements but still demonstrates difficulty with being able to hold objects and use hands for functional tasks.  Continue OT to improve ROM, strength and increased active participation in necessary daily activities.    OT Occupational Profile and History Detailed Assessment- Review of Records and additional review of physical, cognitive, psychosocial history related to current functional performance    Occupational performance deficits (Please refer to evaluation for details): ADL's;IADL's;Leisure;Social Participation    Body Structure / Function / Physical Skills ADL;Continence;Dexterity;Flexibility;Strength;ROM;Balance;Coordination;FMC;IADL;Endurance;UE functional use;Decreased knowledge of use of DME;GMC    Psychosocial Skills Environmental  Adaptations;Habits;Routines and Behaviors    Rehab Potential Fair    Clinical Decision Making Limited treatment options, no task modification necessary    Comorbidities Affecting Occupational Performance: Presence of comorbidities impacting occupational performance    Comorbidities impacting occupational performance description: contractures of bilateral hands, dependent transfers,    OT Frequency 2x / week    OT Duration 12 weeks    OT  Treatment/Interventions Self-care/ADL training;Cryotherapy;Therapeutic exercise;DME and/or AE instruction;Balance training;Neuromuscular education;Manual Therapy;Splinting;Moist Heat;Passive range of motion;Therapeutic activities;Patient/family education    Consulted and Agree with Plan of Care Patient           Patient will benefit from skilled therapeutic intervention in order to improve the following deficits and impairments:   Body Structure / Function / Physical Skills: ADL, Continence, Dexterity, Flexibility, Strength, ROM, Balance, Coordination, FMC, IADL, Endurance, UE functional use, Decreased knowledge of use of DME, GMC   Psychosocial Skills: Environmental  Adaptations, Habits, Routines and Behaviors   Visit Diagnosis: Muscle weakness (generalized)  Central cord syndrome, sequela (HCC)  Other lack of coordination    Problem List Patient Active Problem List   Diagnosis Date Noted  . Acute appendicitis with appendiceal abscess 05/30/2018  . Hypocalcemia 02/15/2018  . Dysuria 02/15/2018  . Bilateral lower extremity edema 02/11/2018  . Vaginal yeast infection 01/30/2018  . Chest tightness 01/27/2018  . Healthcare-associated pneumonia 01/25/2018  . Pleural effusion, not elsewhere classified 01/25/2018  . Acute deep vein thrombosis (DVT) of left lower extremity ([place]) 01/24/2018  . Acute deep vein thrombosis (DVT) of right lower extremity ([place]) 01/24/2018  . Chronic allergic rhinitis 01/24/2018  . Depression with anxiety 01/24/2018  . UTI due to Klebsiella species 01/24/2018  . Protein-calorie malnutrition, severe ([place]) 01/24/2018  . S/P insertion of IVC (inferior vena caval) filter 01/24/2018  . Reactive depression   . Hypokalemia   . Leukocytosis   . Essential hypertension   . Trauma   . 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([place])   . Neuropathic pain   . Neurogenic bowel   . Neurogenic bladder   . Benign essential HTN   . Acute blood loss anemia   . Central cord syndrome at C6 level  of cervical spinal cord ([place]) 11/29/2017  . Central cord syndrome [facility]) 11/29/2017   Wagner Tanzi T Ahlaya Ende, OTR/L, CLT  Sherry [name] 10/12/2019, 9:02 AM  [place] MAIN [facility] SERVICES [place], [place],  [place] Phone: [phone]   Fax:  [phone]  Name: Sherry [name] MRN: [id number] Date of Birth: 1934/02/07

## 2019-10-11 ENCOUNTER — Ambulatory Visit: Payer: Medicare HMO | Admitting: Physical Therapy

## 2019-10-16 ENCOUNTER — Ambulatory Visit: Payer: Medicare HMO | Admitting: Physical Therapy

## 2019-10-19 DIAGNOSIS — G8220 Paraplegia, unspecified: Secondary | ICD-10-CM

## 2019-10-19 DIAGNOSIS — T7840XA Allergy, unspecified, initial encounter: Secondary | ICD-10-CM

## 2019-10-19 DIAGNOSIS — K592 Neurogenic bowel, not elsewhere classified: Secondary | ICD-10-CM

## 2019-10-19 DIAGNOSIS — M1990 Unspecified osteoarthritis, unspecified site: Secondary | ICD-10-CM

## 2019-10-19 DIAGNOSIS — I8291 Chronic embolism and thrombosis of unspecified vein: Secondary | ICD-10-CM

## 2019-10-19 DIAGNOSIS — F39 Unspecified mood [affective] disorder: Secondary | ICD-10-CM | POA: Diagnosis not present

## 2019-10-19 DIAGNOSIS — I10 Essential (primary) hypertension: Secondary | ICD-10-CM | POA: Diagnosis not present

## 2019-10-22 ENCOUNTER — Other Ambulatory Visit: Payer: Self-pay

## 2019-10-22 ENCOUNTER — Ambulatory Visit: Payer: Medicare HMO

## 2019-10-22 DIAGNOSIS — R2689 Other abnormalities of gait and mobility: Secondary | ICD-10-CM

## 2019-10-22 DIAGNOSIS — S14129S Central cord syndrome at unspecified level of cervical spinal cord, sequela: Secondary | ICD-10-CM | POA: Diagnosis not present

## 2019-10-22 DIAGNOSIS — R278 Other lack of coordination: Secondary | ICD-10-CM

## 2019-10-22 DIAGNOSIS — M6281 Muscle weakness (generalized): Secondary | ICD-10-CM

## 2019-10-22 NOTE — Therapy (Signed)
[place] MAIN [facility] SERVICES [address] [place], [place], [address] Phone: [phone]   Fax:  [phone]  Physical Therapy Treatment/Progress Note/Recertification  Dates of reporting period  08/21/19   to   10/22/19  Patient Details  Name: Sherry [name] MRN: [id number] Date of Birth: Sep 23, 1934 Referring Provider (PT): Viviana Simpler   Encounter Date: 10/22/2019   PT End of Session - 10/22/19 1311    Visit Number 10    Number of Visits 16    Date for PT Re-Evaluation 01/14/20    Authorization Type aetna medicare FOTO performed by PT on eval (7/20), score 12    PT Start Time 1307    PT Stop Time 1350    PT Time Calculation (min) 43 min    Equipment Utilized During Treatment Gait belt;Other (comment)   hoyer lift   Activity Tolerance Patient tolerated treatment well    Behavior During Therapy WFL for tasks assessed/performed           Past Medical History:  Diagnosis Date  . Acute blood loss anemia   . Arthritis   . Cancer ([place])    skin  . Central cord syndrome at C6 level of cervical spinal cord ([place]) 11/29/2017  . Hypertension   . Protein-calorie malnutrition, severe ([place]) 01/24/2018  . S/P insertion of IVC (inferior vena caval) filter 01/24/2018  . 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[facility])     Past Surgical History:  Procedure Laterality Date  . ANTERIOR CERVICAL DECOMP/DISCECTOMY FUSION N/A 11/29/2017   Procedure: Cervical five-six, six-seven Anterior Cervical Decompression Fusion;  Surgeon: Judith Part, MD;  Location: [place];  Service: Neurosurgery;  Laterality: N/A;  Cervical five-six, six-seven Anterior Cervical Decompression Fusion  . CATARACT EXTRACTION    . IR IVC FILTER PLMT / S&I /IMG GUID/MOD SED  12/08/2017    There were no vitals filed for this visit.   Subjective Assessment - 10/22/19 1308    Subjective Patient stated that she is doing well today.  No updates in health or medications since last session.  No specific  questions or concerns upon arrival today.    Patient is accompained by: --   aide   Pertinent History Pt is an 85 yo female that fell in 2019, fractured vertebrae in her neck and in her low back per family. 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 After hospital stay, pt discharged to CIR for ~1 month, experienced severe UTI as well as bilateral DVT (IVC filter placed). Discharged to [facility], stayed for about a year, and then moved to [facility] in April 2021. Was receiving PT, but per family facility reported that did not have adequate equipment to maximize PT for her. Pt until about 1 month ago was practicing sit to stand transfers with 1-2 people, and working on static standing. Has a brace for L foot due to PF. Pt currently needs assistance with all ADLs (able to assist with donning/doffing her shirt), bed baths, and needs a hoyer lift for transfers. Able to drive power wheelchair without assistance.    Limitations Standing;Walking;House hold activities;Lifting    How long can you sit comfortably? NA    How long can you stand comfortably? unable    How long can you walk comfortably? unable    Patient Stated Goals "to stand up and walk"    Currently in Pain? No/denies              TREATMENT   Ther-ex  Harrel Lemon lift  from power wc <>mat; Pt completed FOTO (unbilled); Updated goals and plan of care with patient; Supine hip abduction x 10 BLE, with manual resistance;  Supine hip adduction x 10 BLE, with manual resistance; Supine SLR with assist x 10 BLE; Hooklying marching x 10 BLE, assist required for BLE;  Hooklying heel slides with resisted extension x10 bilateral lower extremity; Hooklying SAQ over bolster with light manual resistance for LLE, assist for RLE to get to end range extension x 10; BLE, muscle tapping at R quad to encourage improved muscle activation;  Hooklying bridges over bolster under knees x10; Seated LAQ x 10 BLE;   Therapeutic  Activity Rolling left and right with mod assist x 1 and therapist providing cues for proper sequencing; Seated static balance with trunk extension to improve sitting posture, repeated thoracic and lumbar extension x 5; Seated dynamic reaching in a variety of directions forward, lateral, and crossing midline to challenge sitting stability and core control;   Pt educated throughout session about proper posture and technique with exercises. Improved exercise technique, movement at target joints, use of target muscles after min to mod verbal, visual, tactile cues.    Outcome measures and goals updated with patient today.  Patient reports that she feels like her strength has been improving since starting with therapy.  No change in her FOTO score since initial evaluation.  She still requires moderate assistance for lateral scoot transfers and is too unsafe to attempt squat pivot transfers at this time.  She reports consistent performance of her HEP.  Patient encouraged to continue her HEP and follow-up as scheduled.  She will continue to benefit from additional skilled physical therapy services to continue address deficits in strength, balance, and functional mobility.                        PT Short Term Goals - 10/22/19 1313      PT SHORT TERM GOAL #1   Title The patient will perform initial HEP with minimum assistance in order to improve strength and function.    Time 4    Period Weeks    Status On-going    Target Date 12/03/19             PT Long Term Goals - 10/22/19 1313      PT LONG TERM GOAL #1   Title The patient will be compliant with finalized HEP with minimum assistance in preparation for self management and maintenance of condition.    Time 12    Period Weeks    Status On-going    Target Date 01/14/20      PT LONG TERM GOAL #2   Title The patient will demonstrate at least 10 point improvement on FOTO score indicating an improved ability to perform  functional activities.    Baseline on eval (/720) score 12; 10/22/19: 12    Time 12    Period Weeks    Status On-going    Target Date 01/14/20      PT LONG TERM GOAL #3   Title The patient will demonstrate lateral scooting  for assistance with transfers with minA to maximize independence.    Baseline 10/22/19: Pt requires modA+1 for lateral scooting    Time 8    Period Weeks    Status On-going    Target Date 01/14/20      PT LONG TERM GOAL #4   Title The patient will demonstrate a squat pivot transfer with minimum  assistance to maximize independence and mobility.    Baseline 10/22/19: unable to safely attempt at this time    Time 12    Period Weeks    Status On-going    Target Date 01/14/20      PT LONG TERM GOAL #5   Title The patient will demonstrate sitting without UE support for 2-5 minutes at EOB to improve participation and maximize independence with ADLs.    Baseline 10/22/19: Pt able ot sit unsupported for at least 2 minutes at edge of bed    Time 12    Period Weeks    Status Achieved    Target Date --                 Plan - 10/22/19 1311    Clinical Impression Statement Outcome measures and goals updated with patient today.  Patient reports that she feels like her strength has been improving since starting with therapy.  No change in her FOTO score since initial evaluation.  She still requires moderate assistance for lateral scoot transfers and is too unsafe to attempt squat pivot transfers at this time.  She reports consistent performance of her HEP.  Patient encouraged to continue her HEP and follow-up as scheduled.  She will continue to benefit from additional skilled physical therapy services to continue address deficits in strength, balance, and functional mobility.    Personal Factors and Comorbidities Age;Time since onset of injury/illness/exacerbation;Comorbidity 3+;Fitness    Comorbidities HTN, quadriparesis, history of DVT, neurogenic bladder     Examination-Activity Limitations Bathing;Hygiene/Grooming;Squat;Bed Mobility;Lift;Bend;Stand;Engineer, manufacturing;Toileting;Self Feeding;Transfers;Continence;Sit;Dressing;Sleep;Carry    Examination-Participation Restrictions Church;Laundry;Cleaning;Medication Management;Community Activity;Meal Prep;Interpersonal Relationship    Stability/Clinical Decision Making Evolving/Moderate complexity    Clinical Decision Making Moderate    Rehab Potential Fair    PT Frequency 2x / week    PT Duration 12 weeks    PT Treatment/Interventions ADLs/Self Care Home Management;Electrical Stimulation;Therapeutic activities;Wheelchair mobility training;Vasopneumatic Device;Joint Manipulations;Vestibular;Passive range of motion;Patient/family education;Therapeutic exercise;DME Instruction;Biofeedback;Aquatic Therapy;Moist Heat;Gait training;Balance training;Orthotic Fit/Training;Dry needling;Energy conservation;Taping;Splinting;Neuromuscular re-education;Cryotherapy;Ultrasound;Functional mobility training    PT Next Visit Plan platform attachments for RW to assess standing? transfers, sitting balance/endurance    PT Home Exercise Plan sitting balance unsupported hold 30-45sec with aide    Consulted and Agree with Plan of Care Patient           Patient will benefit from skilled therapeutic intervention in order to improve the following deficits and impairments:  Abnormal gait, Decreased balance, Decreased endurance, Decreased mobility, Difficulty walking, Hypomobility, Impaired sensation, Decreased range of motion, Improper body mechanics, Impaired perceived functional ability, Decreased activity tolerance, Decreased knowledge of use of DME, Decreased safety awareness, Decreased strength, Impaired flexibility, Impaired UE functional use, Postural dysfunction  Visit Diagnosis: Muscle weakness (generalized)  Other lack of coordination  Other abnormalities of gait and mobility     Problem List Patient  Active Problem List   Diagnosis Date Noted  . Acute appendicitis with appendiceal abscess 05/30/2018  . Hypocalcemia 02/15/2018  . Dysuria 02/15/2018  . Bilateral lower extremity edema 02/11/2018  . Vaginal yeast infection 01/30/2018  . Chest tightness 01/27/2018  . Healthcare-associated pneumonia 01/25/2018  . Pleural effusion, not elsewhere classified 01/25/2018  . Acute deep vein thrombosis (DVT) of left lower extremity ([place]) 01/24/2018  . Acute deep vein thrombosis (DVT) of right lower extremity ([place]) 01/24/2018  . Chronic allergic rhinitis 01/24/2018  . Depression with anxiety 01/24/2018  . UTI due to Klebsiella species 01/24/2018  . Protein-calorie malnutrition, severe ([place]) 01/24/2018  . S/P  insertion of IVC (inferior vena caval) filter 01/24/2018  . Reactive depression   . Hypokalemia   . Leukocytosis   . Essential hypertension   . Trauma   . 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([place])   . Neuropathic pain   . Neurogenic bowel   . Neurogenic bladder   . Benign essential HTN   . Acute blood loss anemia   . Central cord syndrome at C6 level of cervical spinal cord ([place]) 11/29/2017  . Central cord syndrome [facility]) 11/29/2017   Phillips Grout PT, DPT, GCS  Marshae Azam 10/22/2019, 3:26 PM  [place] MAIN [facility] SERVICES [address] [place], [place], [address] Phone: [phone]   Fax:  [phone]  Name: Sherry [name] MRN: [id number] Date of Birth: 1934/04/06

## 2019-10-24 ENCOUNTER — Ambulatory Visit: Payer: Medicare HMO | Admitting: Occupational Therapy

## 2019-10-24 ENCOUNTER — Other Ambulatory Visit: Payer: Self-pay

## 2019-10-24 ENCOUNTER — Ambulatory Visit: Payer: Medicare HMO

## 2019-10-24 ENCOUNTER — Encounter: Payer: Self-pay | Admitting: Occupational Therapy

## 2019-10-24 DIAGNOSIS — M6281 Muscle weakness (generalized): Secondary | ICD-10-CM

## 2019-10-24 DIAGNOSIS — R278 Other lack of coordination: Secondary | ICD-10-CM

## 2019-10-24 DIAGNOSIS — R2689 Other abnormalities of gait and mobility: Secondary | ICD-10-CM

## 2019-10-24 DIAGNOSIS — S14129S Central cord syndrome at unspecified level of cervical spinal cord, sequela: Secondary | ICD-10-CM | POA: Diagnosis not present

## 2019-10-24 NOTE — Therapy (Signed)
[place] MAIN [facility] SERVICES [address] [place], [place], [address] Phone: [phone]   Fax:  [phone]  Physical Therapy Treatment  Patient Details  Name: Sherry [name] MRN: [id number] Date of Birth: 13-Feb-1934 Referring Provider (PT): Viviana Simpler   Encounter Date: 10/24/2019   PT End of Session - 10/24/19 1311    Visit Number 11    Number of Visits 16    Date for PT Re-Evaluation 01/14/20    Authorization Type aetna medicare FOTO performed by PT on eval (7/20), score 12    PT Start Time 1345    PT Stop Time 1430    PT Time Calculation (min) 45 min    Equipment Utilized During Treatment Other (comment)   hoyer lift   Activity Tolerance Patient tolerated treatment well    Behavior During Therapy WFL for tasks assessed/performed           Past Medical History:  Diagnosis Date  . Acute blood loss anemia   . Arthritis   . Cancer ([place])    skin  . Central cord syndrome at C6 level of cervical spinal cord ([place]) 11/29/2017  . Hypertension   . Protein-calorie malnutrition, severe ([place]) 01/24/2018  . S/P insertion of IVC (inferior vena caval) filter 01/24/2018  . 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[facility])     Past Surgical History:  Procedure Laterality Date  . ANTERIOR CERVICAL DECOMP/DISCECTOMY FUSION N/A 11/29/2017   Procedure: Cervical five-six, six-seven Anterior Cervical Decompression Fusion;  Surgeon: Judith Part, MD;  Location: [place];  Service: Neurosurgery;  Laterality: N/A;  Cervical five-six, six-seven Anterior Cervical Decompression Fusion  . CATARACT EXTRACTION    . IR IVC FILTER PLMT / S&I /IMG GUID/MOD SED  12/08/2017    There were no vitals filed for this visit.   Subjective Assessment - 10/24/19 1310    Subjective Patient stated that she is doing well today.  No updates in health or medications since last session.  No falls, no sores to report. No specific questions or concerns upon arrival today.    Patient is  accompained by: --   aide   Pertinent History Pt is an 85 yo female that fell in 2019, fractured vertebrae in her neck and in her low back per family. 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 After hospital stay, pt discharged to CIR for ~1 month, experienced severe UTI as well as bilateral DVT (IVC filter placed). Discharged to [facility], stayed for about a year, and then moved to [facility] in April 2021. Was receiving PT, but per family facility reported that did not have adequate equipment to maximize PT for her. Pt until about 1 month ago was practicing sit to stand transfers with 1-2 people, and working on static standing. Has a brace for L foot due to PF. Pt currently needs assistance with all ADLs (able to assist with donning/doffing her shirt), bed baths, and needs a hoyer lift for transfers. Able to drive power wheelchair without assistance.    Limitations Standing;Walking;House hold activities;Lifting    How long can you sit comfortably? NA    How long can you stand comfortably? unable    How long can you walk comfortably? unable    Patient Stated Goals "to stand up and walk"    Currently in Pain? No/denies               TREATMENT   Ther-ex  Hoyer lift from power wc <>mat; Supine hip abduction x  10 BLE, with manual resistance;  Supine hip adduction x 10 BLE, with manual resistance; Supine quad and glut sets with 3s hold x 10; Hooklying marching x 10 BLE, assist required for BLE;  Hooklying heel slides with resisted extension x10 bilateral lower extremity; Hooklying SAQ over bolster with light manual resistance for LLE, assist for RLE to get to end range extension x 10; BLE, muscle tapping at R quad to encourage improved muscle activation;  Hooklying bridges over bolster under knees x10; Rolling left and right with mod assist x 1 and therapist providing cues for proper sequencing, performed 5 times in each direction;   Pt educated throughout session  about proper posture and technique with exercises. Improved exercise technique, movement at target joints, use of target muscles after min to mod verbal, visual, tactile cues.    Patient demonstrates excellent motivation during session today. Orthotist arrived at start of session for fitting new nightime stretching AFO for LLE to improve L ankle dorsiflexion. Due to orthotist fidditional therapy time was somewhat shortened. Performed mat table exercises and deferred standing again today due to time constraints. Will perform at upcoming visit.  Patient encouraged to continue her HEP and follow-up as scheduled.  She will continue to benefit from additional skilled physical therapy services to continue address deficits in strength, balance, and functional mobility.                           PT Short Term Goals - 10/22/19 1313      PT SHORT TERM GOAL #1   Title The patient will perform initial HEP with minimum assistance in order to improve strength and function.    Time 4    Period Weeks    Status On-going    Target Date 12/03/19             PT Long Term Goals - 10/22/19 1313      PT LONG TERM GOAL #1   Title The patient will be compliant with finalized HEP with minimum assistance in preparation for self management and maintenance of condition.    Time 12    Period Weeks    Status On-going    Target Date 01/14/20      PT LONG TERM GOAL #2   Title The patient will demonstrate at least 10 point improvement on FOTO score indicating an improved ability to perform functional activities.    Baseline on eval (/720) score 12; 10/22/19: 12    Time 12    Period Weeks    Status On-going    Target Date 01/14/20      PT LONG TERM GOAL #3   Title The patient will demonstrate lateral scooting  for assistance with transfers with minA to maximize independence.    Baseline 10/22/19: Pt requires modA+1 for lateral scooting    Time 8    Period Weeks    Status On-going     Target Date 01/14/20      PT LONG TERM GOAL #4   Title The patient will demonstrate a squat pivot transfer with minimum assistance to maximize independence and mobility.    Baseline 10/22/19: unable to safely attempt at this time    Time 12    Period Weeks    Status On-going    Target Date 01/14/20      PT LONG TERM GOAL #5   Title The patient will demonstrate sitting without UE support for 2-5 minutes at EOB  to improve participation and maximize independence with ADLs.    Baseline 10/22/19: Pt able ot sit unsupported for at least 2 minutes at edge of bed    Time 12    Period Weeks    Status Achieved    Target Date --                 Plan - 10/24/19 1311    Clinical Impression Statement Patient demonstrates excellent motivation during session today. Orthotist arrived at start of session for fitting new nightime stretching AFO for LLE to improve L ankle dorsiflexion. Due to orthotist fidditional therapy time was somewhat shortened. Performed mat table exercises and deferred standing again today due to time constraints. Will perform at upcoming visit.  Patient encouraged to continue her HEP and follow-up as scheduled.  She will continue to benefit from additional skilled physical therapy services to continue address deficits in strength, balance, and functional mobility.    Personal Factors and Comorbidities Age;Time since onset of injury/illness/exacerbation;Comorbidity 3+;Fitness    Comorbidities HTN, quadriparesis, history of DVT, neurogenic bladder    Examination-Activity Limitations Bathing;Hygiene/Grooming;Squat;Bed Mobility;Lift;Bend;Stand;Engineer, manufacturing;Toileting;Self Feeding;Transfers;Continence;Sit;Dressing;Sleep;Carry    Examination-Participation Restrictions Church;Laundry;Cleaning;Medication Management;Community Activity;Meal Prep;Interpersonal Relationship    Stability/Clinical Decision Making Evolving/Moderate complexity    Rehab Potential Fair    PT  Frequency 2x / week    PT Duration 12 weeks    PT Treatment/Interventions ADLs/Self Care Home Management;Electrical Stimulation;Therapeutic activities;Wheelchair mobility training;Vasopneumatic Device;Joint Manipulations;Vestibular;Passive range of motion;Patient/family education;Therapeutic exercise;DME Instruction;Biofeedback;Aquatic Therapy;Moist Heat;Gait training;Balance training;Orthotic Fit/Training;Dry needling;Energy conservation;Taping;Splinting;Neuromuscular re-education;Cryotherapy;Ultrasound;Functional mobility training    PT Next Visit Plan platform attachments for RW to assess standing? transfers, sitting balance/endurance    PT Home Exercise Plan sitting balance unsupported hold 30-45sec with aide    Consulted and Agree with Plan of Care Patient           Patient will benefit from skilled therapeutic intervention in order to improve the following deficits and impairments:  Abnormal gait, Decreased balance, Decreased endurance, Decreased mobility, Difficulty walking, Hypomobility, Impaired sensation, Decreased range of motion, Improper body mechanics, Impaired perceived functional ability, Decreased activity tolerance, Decreased knowledge of use of DME, Decreased safety awareness, Decreased strength, Impaired flexibility, Impaired UE functional use, Postural dysfunction  Visit Diagnosis: Muscle weakness (generalized)  Other abnormalities of gait and mobility     Problem List Patient Active Problem List   Diagnosis Date Noted  . Acute appendicitis with appendiceal abscess 05/30/2018  . Hypocalcemia 02/15/2018  . Dysuria 02/15/2018  . Bilateral lower extremity edema 02/11/2018  . Vaginal yeast infection 01/30/2018  . Chest tightness 01/27/2018  . Healthcare-associated pneumonia 01/25/2018  . Pleural effusion, not elsewhere classified 01/25/2018  . Acute deep vein thrombosis (DVT) of left lower extremity ([place]) 01/24/2018  . Acute deep vein thrombosis (DVT) of right lower  extremity ([place]) 01/24/2018  . Chronic allergic rhinitis 01/24/2018  . Depression with anxiety 01/24/2018  . UTI due to Klebsiella species 01/24/2018  . Protein-calorie malnutrition, severe ([place]) 01/24/2018  . S/P insertion of IVC (inferior vena caval) filter 01/24/2018  . Reactive depression   . Hypokalemia   . Leukocytosis   . Essential hypertension   . Trauma   . 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([place])   . Neuropathic pain   . Neurogenic bowel   . Neurogenic bladder   . Benign essential HTN   . Acute blood loss anemia   . Central cord syndrome at C6 level of cervical spinal cord ([place]) 11/29/2017  . Central cord syndrome ([place]) 11/29/2017   Lyndel Safe  Dale Ribeiro PT, DPT, GCS  Thyra Yinger 10/24/2019, 5:27 PM  [place] MAIN [facility] SERVICES [address] [place], [place], [address] Phone: [phone]   Fax:  [phone]  Name: Sherry [name] MRN: [id number] Date of Birth: 1934-02-03

## 2019-10-24 NOTE — Therapy (Signed)
[place] MAIN [facility] SERVICES [address] [place], [place], [address] Phone: [phone]   Fax:  [phone]  Occupational Therapy Treatment  Patient Details  Name: Sherry [name] MRN: [id number] Date of Birth: [birth year] No data recorded  Encounter Date: 10/24/2019   OT End of Session - 10/24/19 1610    Visit Number 5    Number of Visits 24    Date for OT Re-Evaluation 12/10/19    OT Start Time 1300    OT Stop Time 1345    OT Time Calculation (min) 45 min    Activity Tolerance Patient tolerated treatment well    Behavior During Therapy [facility] for tasks assessed/performed           Past Medical History:  Diagnosis Date  . Acute blood loss anemia   . Arthritis   . Cancer ([place])    skin  . Central cord syndrome at C6 level of cervical spinal cord ([place]) 11/29/2017  . Hypertension   . Protein-calorie malnutrition, severe ([place]) 01/24/2018  . S/P insertion of IVC (inferior vena caval) filter 01/24/2018  . 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[facility])     Past Surgical History:  Procedure Laterality Date  . ANTERIOR CERVICAL DECOMP/DISCECTOMY FUSION N/A 11/29/2017   Procedure: Cervical five-six, six-seven Anterior Cervical Decompression Fusion;  Surgeon: Judith Part, MD;  Location: [place];  Service: Neurosurgery;  Laterality: N/A;  Cervical five-six, six-seven Anterior Cervical Decompression Fusion  . CATARACT EXTRACTION    . IR IVC FILTER PLMT / S&I /IMG GUID/MOD SED  12/08/2017    There were no vitals filed for this visit.   Subjective Assessment - 10/24/19 1352    Subjective  Pt. reports doing well today. Pt. was here wih her caregiver.    Pertinent History Patient s/p fall November 29, 2017 resulting in diagnosis of central cord syndrome at C 6 level.  She has had therapy in multiple venues but with recent move to [facility], therapy staff recommended she seek outpatient therapy for her needs.    Patient Stated Goals Patient would like to be able to  move better in bed, stand and perform self care tasks.    Currently in Pain? No/denies          OT Treatment  Pt. Tolerated bilateral shoulder flexion, extension, abduction, elbow flexion, extension forearm supination AAROM/AROM. PROM bilateral wrist flexion, and extension. Pt. education was provided about positioning the LUE on the w/c armrest. No reports of pain or discomfort with ROM.  Pt. reports that she uses her right hand to perform self-feeding with adaptive equipment. Pt. reports that she is able to use her right thumb to hold items. Pt. Tolerated ROM well without reports of pain, or discomfort. Pt. Continues to benefit from working on improving ROM in order to work towards increasing engagement of her bilateral hands during ADLs, and IADL tasks.                    OT Education - 10/24/19 1609    Education Details HEP    Person(s) Educated Patient;Caregiver(s)    Methods Explanation;Demonstration    Comprehension Verbalized understanding;Need further instruction               OT Long Term Goals - 09/21/19 0929      OT LONG TERM GOAL #1   Title Patient and caregiver will demonstrate understanding of home exercise program for ROM.    Baseline no current program at eval  Time 6    Period Weeks    Status New    Target Date 10/29/19      OT LONG TERM GOAL #2   Title Patient will demonstrate ability to don shirt with min assist from seated position.    Baseline max at eval    Time 6    Period Weeks    Status New    Target Date 10/29/19      OT LONG TERM GOAL #3   Title Patient will demonstrate improved sitting balance at the edge of the bed to participate in self care tasks.    Baseline requires assist at eval    Time 12    Period Weeks    Status New    Target Date 12/10/19      OT LONG TERM GOAL #4   Title Patient will demonstrate improved composite finger flexion to hold deodorant to apply to underarms.    Baseline unable at eval    Time 12     Period Weeks    Status New    Target Date 12/10/19      OT LONG TERM GOAL #5   Title Pt will complete self feeding with setup, use of universal cuff and minimal spillage.    Baseline inconsistent with feeding and increased spillage.    Time 12    Period Weeks    Status New    Target Date 12/10/19      Long Term Additional Goals   Additional Long Term Goals Yes      OT LONG TERM GOAL #6   Title Patient will increase right UE ROM to comb the back of her hair with modified independence.    Baseline can comb the sides but not the back    Time 12    Period Weeks    Status New    Target Date 12/10/19                 Plan - 10/24/19 1610    Clinical Impression Statement Pt. reports that she uses her right hand to perform self-feeding with adaptive equipment. Pt. reports that she is able to use her right thumb to hold items. Pt. Tolerated ROM well without reports of pain, or discomfort. Pt. Continues to benefit from working on improving ROM in order to work towards increasing engagement of her bilateral hands during ADLs, and IADL tasks.   OT Occupational Profile and History Detailed Assessment- Review of Records and additional review of physical, cognitive, psychosocial history related to current functional performance    Occupational performance deficits (Please refer to evaluation for details): ADL's;IADL's;Leisure;Social Participation    Body Structure / Function / Physical Skills ADL;Continence;Dexterity;Flexibility;Strength;ROM;Balance;Coordination;FMC;IADL;Endurance;UE functional use;Decreased knowledge of use of DME;GMC    Rehab Potential Fair    Clinical Decision Making Limited treatment options, no task modification necessary    Comorbidities Affecting Occupational Performance: Presence of comorbidities impacting occupational performance    Comorbidities impacting occupational performance description: contractures of bilateral hands, dependent transfers,    Modification  or Assistance to Complete Evaluation  Min-Moderate modification of tasks or assist with assess necessary to complete eval    OT Frequency 2x / week    OT Duration 12 weeks    OT Treatment/Interventions Self-care/ADL training;Cryotherapy;Therapeutic exercise;DME and/or AE instruction;Balance training;Neuromuscular education;Manual Therapy;Splinting;Moist Heat;Passive range of motion;Therapeutic activities;Patient/family education    Consulted and Agree with Plan of Care Patient           Patient will benefit from skilled  therapeutic intervention in order to improve the following deficits and impairments:   Body Structure / Function / Physical Skills: ADL, Continence, Dexterity, Flexibility, Strength, ROM, Balance, Coordination, FMC, IADL, Endurance, UE functional use, Decreased knowledge of use of DME, GMC       Visit Diagnosis: Muscle weakness (generalized)  Other lack of coordination    Problem List Patient Active Problem List   Diagnosis Date Noted  . Acute appendicitis with appendiceal abscess 05/30/2018  . Hypocalcemia 02/15/2018  . Dysuria 02/15/2018  . Bilateral lower extremity edema 02/11/2018  . Vaginal yeast infection 01/30/2018  . Chest tightness 01/27/2018  . Healthcare-associated pneumonia 01/25/2018  . Pleural effusion, not elsewhere classified 01/25/2018  . Acute deep vein thrombosis (DVT) of left lower extremity ([place]) 01/24/2018  . Acute deep vein thrombosis (DVT) of right lower extremity ([place]) 01/24/2018  . Chronic allergic rhinitis 01/24/2018  . Depression with anxiety 01/24/2018  . UTI due to Klebsiella species 01/24/2018  . Protein-calorie malnutrition, severe ([place]) 01/24/2018  . S/P insertion of IVC (inferior vena caval) filter 01/24/2018  . Reactive depression   . Hypokalemia   . Leukocytosis   . Essential hypertension   . Trauma   . 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([place])   . Neuropathic pain   . Neurogenic bowel   . Neurogenic bladder   . Benign essential HTN   .  Acute blood loss anemia   . Central cord syndrome at C6 level of cervical spinal cord ([place]) 11/29/2017  . Central cord syndrome [facility]) 11/29/2017    Harrel Carina, MS, OTR/L 10/24/2019, 4:12 PM  [place] MAIN [facility] SERVICES [address] [place], [place], [address] Phone: [phone]   Fax:  [phone]  Name: Sherry [name] MRN: [id number] Date of Birth: 04-Feb-1934

## 2019-10-29 ENCOUNTER — Other Ambulatory Visit: Payer: Self-pay

## 2019-10-29 ENCOUNTER — Ambulatory Visit: Payer: Medicare HMO

## 2019-10-29 ENCOUNTER — Ambulatory Visit: Payer: Medicare HMO | Admitting: Occupational Therapy

## 2019-10-29 DIAGNOSIS — M6281 Muscle weakness (generalized): Secondary | ICD-10-CM

## 2019-10-29 DIAGNOSIS — S14129S Central cord syndrome at unspecified level of cervical spinal cord, sequela: Secondary | ICD-10-CM | POA: Diagnosis not present

## 2019-10-29 DIAGNOSIS — R278 Other lack of coordination: Secondary | ICD-10-CM

## 2019-10-29 DIAGNOSIS — R2689 Other abnormalities of gait and mobility: Secondary | ICD-10-CM

## 2019-10-29 NOTE — Therapy (Signed)
[place] MAIN [facility] SERVICES [address] [place], [place], [address] Phone: [phone]   Fax:  [phone]  Physical Therapy Treatment  Patient Details  Name: Sherry [name] MRN: [id number] Date of Birth: 03/17/1934 Referring Provider (PT): Viviana Simpler   Encounter Date: 10/29/2019   PT End of Session - 10/29/19 1307    Visit Number 12    Number of Visits 16    Date for PT Re-Evaluation 01/14/20    Authorization Type a[employer]na medicare FOTO performed by PT on eval (7/20), score 12    PT Start Time 1300    PT Stop Time 1345    PT Time Calculation (min) 45 min    Equipment Utilized During Treatment Other (comment)   hoyer lift   Activity Tolerance Patient tolerated treatment well    Behavior During Therapy WFL for tasks assessed/performed           Past Medical History:  Diagnosis Date   Acute blood loss anemia    Arthritis    Cancer ([place])    skin   Central cord syndrome at C6 level of cervical spinal cord ([place]) 11/29/2017   Hypertension    Protein-calorie malnutrition, severe ([place]) 01/24/2018   S/P insertion of IVC (inferior vena caval) filter 01/24/2018   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([place])     Past Surgical History:  Procedure Laterality Date   ANTERIOR CERVICAL DECOMP/DISCECTOMY FUSION N/A 11/29/2017   Procedure: Cervical five-six, six-seven Anterior Cervical Decompression Fusion;  Surgeon: Judith Part, MD;  Location: [place];  Service: Neurosurgery;  Laterality: N/A;  Cervical five-six, six-seven Anterior Cervical Decompression Fusion   CATARACT EXTRACTION     IR IVC FILTER PLMT / S&I /IMG GUID/MOD SED  12/08/2017    There were no vitals filed for this visit.   Subjective Assessment - 10/29/19 1307    Subjective Patient stated that she is doing well today.  No updates in health or medications since last session.  No falls, no sores to report. No specific questions or concerns upon arrival today.    Patient is  accompained by: --   aide   Pertinent History Pt is an 85 yo female that fell in 2019, fractured vertebrae in her neck and in her low back per family. 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 After hospital stay, pt discharged to [facility] for ~1 month, experienced severe UTI as well as bilateral DVT (IVC filter placed). Discharged to [facility], stayed for about a year, and then moved to [facility] in April 2021. Was receiving PT, but per family facility reported that did not have adequate equipment to maximize PT for her. Pt until about 1 month ago was practicing sit to stand transfers with 1-2 people, and working on static standing. Has a brace for L foot due to PF. Pt currently needs assistance with all ADLs (able to assist with donning/doffing her shirt), bed baths, and needs a hoyer lift for transfers. Able to drive power wheelchair without assistance.    Limitations Standing;Walking;House hold activities;Lifting    How long can you sit comfortably? NA    How long can you stand comfortably? unable    How long can you walk comfortably? unable    Patient Stated Goals "to stand up and walk"    Currently in Pain? No/denies             TREATMENT   Ther-ex  Hoyer lift from power wc <>mat; Supine hip abduction 2 x 10BLE,  with manual resistance;  Supine hip adduction 2 x 10BLE, with manual resistance; Supine SLR with assist 2 x 10 BLE; Supine quad and glut sets 3s hold 2 x 10 BLE; Hooklying heel slides with resisted extension 2 x10 bilateral lower extremity; Hooklying marching 2 x 10BLE, assist required for BLE;  Hooklying clams with light manual resistance 2 x 10 BLE; Hooklying adductor squeezes with manual resistance 2 x 10 BLE; Hooklying SAQ over bolster with light manual resistance for LLE, assist for RLE to get to end range extension 2 x 10BLE, muscle tapping at R quad to encourage improved muscle activation;  Hooklying bridges over bolster under knees 3s hold 2  x10;   Therapeutic Activity Rolling left and right with mod assist x 1 and therapist providing cues for proper sequencing;   Pt educated throughout session about proper posture and technique with exercises. Improved exercise technique, movement at target joints, use of target muscles after min to mod verbal, visual, tactile cues.    Pt provided option of performing mat table exercises today or work on standing. She would prefer to work on mat table strengthening today and then focus on standing during her second PT session later this week. She is able to complete all exercises as instructed today.  Performed 2 sets of each exercise. Continued to work on [place] with rolling left and right. Will plan on practicing standing with the lite gait next session. Patient encouraged to continue her HEP and follow-up as scheduled.  She will continue to benefit from additional skilled physical therapy services to continue address deficits in strength, balance, and functional mobility.                             PT Short Term Goals - 10/22/19 1313      PT SHORT TERM GOAL #1   Title The patient will perform initial HEP with minimum assistance in order to improve strength and function.    Time 4    Period Weeks    Status On-going    Target Date 12/03/19             PT Long Term Goals - 10/22/19 1313      PT LONG TERM GOAL #1   Title The patient will be compliant with finalized HEP with minimum assistance in preparation for self management and maintenance of condition.    Time 12    Period Weeks    Status On-going    Target Date 01/14/20      PT LONG TERM GOAL #2   Title The patient will demonstrate at least 10 point improvement on FOTO score indicating an improved ability to perform functional activities.    Baseline on eval (/720) score 12; 10/22/19: 12    Time 12    Period Weeks    Status On-going    Target Date 01/14/20      PT LONG TERM GOAL #3   Title  The patient will demonstrate lateral scooting  for assistance with transfers with minA to maximize independence.    Baseline 10/22/19: Pt requires modA+1 for lateral scooting    Time 8    Period Weeks    Status On-going    Target Date 01/14/20      PT LONG TERM GOAL #4   Title The patient will demonstrate a squat pivot transfer with minimum assistance to maximize independence and mobility.    Baseline 10/22/19: unable to safely  attempt at this time    Time 12    Period Weeks    Status On-going    Target Date 01/14/20      PT LONG TERM GOAL #5   Title The patient will demonstrate sitting without UE support for 2-5 minutes at EOB to improve participation and maximize independence with ADLs.    Baseline 10/22/19: Pt able ot sit unsupported for at least 2 minutes at edge of bed    Time 12    Period Weeks    Status Achieved    Target Date --                 Plan - 10/29/19 1308    Clinical Impression Statement Pt provided option of performing mat table exercises today or work on standing. She would prefer to work on mat table strengthening today and then focus on standing during her second PT session later this week. She is able to complete all exercises as instructed today.  Performed 2 sets of each exercise. Continued to work on [place] with rolling left and right. Will plan on practicing standing with the lite gait next session. Patient encouraged to continue her HEP and follow-up as scheduled.  She will continue to benefit from additional skilled physical therapy services to continue address deficits in strength, balance, and functional mobility.    Personal Factors and Comorbidities Age;Time since onset of injury/illness/exacerbation;Comorbidity 3+;Fitness    Comorbidities HTN, quadriparesis, history of DVT, neurogenic bladder    Examination-Activity Limitations Bathing;Hygiene/Grooming;Squat;Bed Mobility;Lift;Bend;Stand;Engineer, manufacturing;Toileting;Self  Feeding;Transfers;Continence;Sit;Dressing;Sleep;Carry    Examination-Participation Restrictions Church;Laundry;Cleaning;Medication Management;Community Activity;Meal Prep;Interpersonal Relationship    Stability/Clinical Decision Making Evolving/Moderate complexity    Rehab Potential Fair    PT Frequency 2x / week    PT Duration 12 weeks    PT Treatment/Interventions ADLs/Self Care Home Management;Electrical Stimulation;Therapeutic activities;Wheelchair mobility training;Vasopneumatic Device;Joint Manipulations;Vestibular;Passive range of motion;Patient/family education;Therapeutic exercise;DME Instruction;Biofeedback;Aquatic Therapy;Moist Heat;Gait training;Balance training;Orthotic Fit/Training;Dry needling;Energy conservation;Taping;Splinting;Neuromuscular re-education;Cryotherapy;Ultrasound;Functional mobility training    PT Next Visit Plan platform attachments for RW to assess standing? transfers, sitting balance/endurance    PT Home Exercise Plan sitting balance unsupported hold 30-45sec with aide    Consulted and Agree with Plan of Care Patient           Patient will benefit from skilled therapeutic intervention in order to improve the following deficits and impairments:  Abnormal gait, Decreased balance, Decreased endurance, Decreased mobility, Difficulty walking, Hypomobility, Impaired sensation, Decreased range of motion, Improper body mechanics, Impaired perceived functional ability, Decreased activity tolerance, Decreased knowledge of use of DME, Decreased safety awareness, Decreased strength, Impaired flexibility, Impaired UE functional use, Postural dysfunction  Visit Diagnosis: Muscle weakness (generalized)  Other abnormalities of gait and mobility     Problem List Patient Active Problem List   Diagnosis Date Noted   Acute appendicitis with appendiceal abscess 05/30/2018   Hypocalcemia 02/15/2018   Dysuria 02/15/2018   Bilateral lower extremity edema 02/11/2018    Vaginal yeast infection 01/30/2018   Chest tightness 01/27/2018   Healthcare-associated pneumonia 01/25/2018   Pleural effusion, not elsewhere classified 01/25/2018   Acute deep vein thrombosis (DVT) of left lower extremity ([place]) 01/24/2018   Acute deep vein thrombosis (DVT) of right lower extremity ([facility]) 01/24/2018   Chronic allergic rhinitis 01/24/2018   Depression with anxiety 01/24/2018   UTI due to Klebsiella species 01/24/2018   Protein-calorie malnutrition, severe ([place]) 01/24/2018   S/P insertion of IVC (inferior vena caval) filter 01/24/2018   Reactive depression    Hypokalemia  Leukocytosis    Essential hypertension    Trauma    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([facility])    Neuropathic pain    Neurogenic bowel    Neurogenic bladder    Benign essential HTN    Acute blood loss anemia    Central cord syndrome at C6 level of cervical spinal cord ([place]) 11/29/2017   Central cord syndrome ([place]) 11/29/2017   Phillips Grout PT, DPT, GCS  Makyla Bye 10/29/2019, 2:50 PM  [place] MAIN [facility] SERVICES [address] [place], [place], [address] Phone: [phone]   Fax:  [phone]  Name: JANAL HAAK MRN: [id number] Date of Birth: Sep 17, 1934

## 2019-10-31 ENCOUNTER — Ambulatory Visit: Payer: Medicare HMO | Admitting: Occupational Therapy

## 2019-10-31 ENCOUNTER — Encounter: Payer: Self-pay | Admitting: Physical Therapy

## 2019-10-31 ENCOUNTER — Encounter: Payer: Self-pay | Admitting: Occupational Therapy

## 2019-10-31 ENCOUNTER — Other Ambulatory Visit: Payer: Self-pay

## 2019-10-31 ENCOUNTER — Ambulatory Visit: Payer: Medicare HMO | Admitting: Physical Therapy

## 2019-10-31 DIAGNOSIS — R278 Other lack of coordination: Secondary | ICD-10-CM

## 2019-10-31 DIAGNOSIS — M6281 Muscle weakness (generalized): Secondary | ICD-10-CM

## 2019-10-31 DIAGNOSIS — S14129S Central cord syndrome at unspecified level of cervical spinal cord, sequela: Secondary | ICD-10-CM | POA: Diagnosis not present

## 2019-10-31 DIAGNOSIS — R2689 Other abnormalities of gait and mobility: Secondary | ICD-10-CM

## 2019-10-31 DIAGNOSIS — R140 Abdominal distension (gaseous): Secondary | ICD-10-CM

## 2019-10-31 DIAGNOSIS — O2230 Deep phlebothrombosis in pregnancy, unspecified trimester: Secondary | ICD-10-CM

## 2019-10-31 NOTE — Therapy (Signed)
[place] MAIN [facility] SERVICES [address] [place], [place], [address] Phone: [phone]   Fax:  [phone]  Physical Therapy Treatment  Patient Details  Name: Sherry [name] MRN: [id number] Date of Birth: 01/08/1935 Referring Provider (PT): Viviana Simpler   Encounter Date: 10/31/2019   PT End of Session - 10/31/19 1316    Visit Number 13    Number of Visits 16    Date for PT Re-Evaluation 01/14/20    Authorization Type aetna medicare FOTO performed by PT on eval (7/20), score 12    PT Start Time 1300    PT Stop Time 1345    PT Time Calculation (min) 45 min    Equipment Utilized During Treatment Other (comment)   hoyer lift   Activity Tolerance Patient tolerated treatment well    Behavior During Therapy WFL for tasks assessed/performed           Past Medical History:  Diagnosis Date  . Acute blood loss anemia   . Arthritis   . Cancer ([place])    skin  . Central cord syndrome at C6 level of cervical spinal cord ([place]) 11/29/2017  . Hypertension   . Protein-calorie malnutrition, severe ([place]) 01/24/2018  . S/P insertion of IVC (inferior vena caval) filter 01/24/2018  . 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[facility])     Past Surgical History:  Procedure Laterality Date  . ANTERIOR CERVICAL DECOMP/DISCECTOMY FUSION N/A 11/29/2017   Procedure: Cervical five-six, six-seven Anterior Cervical Decompression Fusion;  Surgeon: Judith Part, MD;  Location: [place];  Service: Neurosurgery;  Laterality: N/A;  Cervical five-six, six-seven Anterior Cervical Decompression Fusion  . CATARACT EXTRACTION    . IR IVC FILTER PLMT / S&I /IMG GUID/MOD SED  12/08/2017    There were no vitals filed for this visit.   Subjective Assessment - 10/31/19 1315    Subjective Patient stated that she is doing well today.  No updates in health or medications since last session.  No falls, no sores to report. No specific questions or concerns upon arrival today.    Patient is  accompained by: --   aide   Pertinent History Pt is an 85 yo female that fell in 2019, fractured vertebrae in her neck and in her low back per family. 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 After hospital stay, pt discharged to [facility] for ~1 month, experienced severe UTI as well as bilateral DVT (IVC filter placed). Discharged to [facility], stayed for about a year, and then moved to [facility] in April 2021. Was receiving PT, but per family facility reported that did not have adequate equipment to maximize PT for her. Pt until about 1 month ago was practicing sit to stand transfers with 1-2 people, and working on static standing. Has a brace for L foot due to PF. Pt currently needs assistance with all ADLs (able to assist with donning/doffing her shirt), bed baths, and needs a hoyer lift for transfers. Able to drive power wheelchair without assistance.    Limitations Standing;Walking;House hold activities;Lifting    How long can you sit comfortably? NA    How long can you stand comfortably? unable    How long can you walk comfortably? unable    Patient Stated Goals "to stand up and walk"    Currently in Pain? No/denies    Multiple Pain Sites No           Treatment:  Hoyer lift from power wc <>mat  Rolling left and  right with mod assist x 1  Supine to sit with mod assist x 3 reps Sit <>stand with light gait and standing x57mins x 1,10 mins x 1 , locking and unlocking left and right knee , L heel is not able to touch the floor  Sit to side sit left x 3, sit to side sit right x 3 with mod assist to right and min assist to left side Rolling left and right with hooklying position x 3 with mod assist  Reviewed HEP  Patient performed with instruction, verbal cues, tactile cues of therapist: goal:increase tissue extensibility, promote proper posture, improve mobility                          PT Education - 10/31/19 1316    Education Details HEP     Person(s) Educated Patient    Methods Explanation    Comprehension Verbalized understanding            PT Short Term Goals - 10/22/19 1313      PT SHORT TERM GOAL #1   Title The patient will perform initial HEP with minimum assistance in order to improve strength and function.    Time 4    Period Weeks    Status On-going    Target Date 12/03/19             PT Long Term Goals - 10/22/19 1313      PT LONG TERM GOAL #1   Title The patient will be compliant with finalized HEP with minimum assistance in preparation for self management and maintenance of condition.    Time 12    Period Weeks    Status On-going    Target Date 01/14/20      PT LONG TERM GOAL #2   Title The patient will demonstrate at least 10 point improvement on FOTO score indicating an improved ability to perform functional activities.    Baseline on eval (/720) score 12; 10/22/19: 12    Time 12    Period Weeks    Status On-going    Target Date 01/14/20      PT LONG TERM GOAL #3   Title The patient will demonstrate lateral scooting  for assistance with transfers with minA to maximize independence.    Baseline 10/22/19: Pt requires modA+1 for lateral scooting    Time 8    Period Weeks    Status On-going    Target Date 01/14/20      PT LONG TERM GOAL #4   Title The patient will demonstrate a squat pivot transfer with minimum assistance to maximize independence and mobility.    Baseline 10/22/19: unable to safely attempt at this time    Time 12    Period Weeks    Status On-going    Target Date 01/14/20      PT LONG TERM GOAL #5   Title The patient will demonstrate sitting without UE support for 2-5 minutes at EOB to improve participation and maximize independence with ADLs.    Baseline 10/22/19: Pt able ot sit unsupported for at least 2 minutes at edge of bed    Time 12    Period Weeks    Status Achieved    Target Date --                 Plan - 10/31/19 1316    Clinical Impression Statement  Patient performs standing in the light gait and moderate  weight shifting left and right with fatigue. Patient has difficulty with shifting weight fwd over her feet. She is able to continue to do her HEP with assist at home. Patient will continue to benefit from skilled PT to improve mobility and strength.    Personal Factors and Comorbidities Age;Time since onset of injury/illness/exacerbation;Comorbidity 3+;Fitness    Comorbidities HTN, quadriparesis, history of DVT, neurogenic bladder    Examination-Activity Limitations Bathing;Hygiene/Grooming;Squat;Bed Mobility;Lift;Bend;Stand;Engineer, manufacturing;Toileting;Self Feeding;Transfers;Continence;Sit;Dressing;Sleep;Carry    Examination-Participation Restrictions Church;Laundry;Cleaning;Medication Management;Community Activity;Meal Prep;Interpersonal Relationship    Stability/Clinical Decision Making Evolving/Moderate complexity    Rehab Potential Fair    PT Frequency 2x / week    PT Duration 12 weeks    PT Treatment/Interventions ADLs/Self Care Home Management;Electrical Stimulation;Therapeutic activities;Wheelchair mobility training;Vasopneumatic Device;Joint Manipulations;Vestibular;Passive range of motion;Patient/family education;Therapeutic exercise;DME Instruction;Biofeedback;Aquatic Therapy;Moist Heat;Gait training;Balance training;Orthotic Fit/Training;Dry needling;Energy conservation;Taping;Splinting;Neuromuscular re-education;Cryotherapy;Ultrasound;Functional mobility training    PT Next Visit Plan platform attachments for RW to assess standing? transfers, sitting balance/endurance    PT Home Exercise Plan sitting balance unsupported hold 30-45sec with aide    Consulted and Agree with Plan of Care Patient           Patient will benefit from skilled therapeutic intervention in order to improve the following deficits and impairments:  Abnormal gait, Decreased balance, Decreased endurance, Decreased mobility, Difficulty walking,  Hypomobility, Impaired sensation, Decreased range of motion, Improper body mechanics, Impaired perceived functional ability, Decreased activity tolerance, Decreased knowledge of use of DME, Decreased safety awareness, Decreased strength, Impaired flexibility, Impaired UE functional use, Postural dysfunction  Visit Diagnosis: Muscle weakness (generalized)  Other abnormalities of gait and mobility  Other lack of coordination  Central cord syndrome, sequela ([facility])  Abdominal distention  DVT (deep vein thrombosis) in pregnancy     Problem List Patient Active Problem List   Diagnosis Date Noted  . Acute appendicitis with appendiceal abscess 05/30/2018  . Hypocalcemia 02/15/2018  . Dysuria 02/15/2018  . Bilateral lower extremity edema 02/11/2018  . Vaginal yeast infection 01/30/2018  . Chest tightness 01/27/2018  . Healthcare-associated pneumonia 01/25/2018  . Pleural effusion, not elsewhere classified 01/25/2018  . Acute deep vein thrombosis (DVT) of left lower extremity ([place]) 01/24/2018  . Acute deep vein thrombosis (DVT) of right lower extremity ([place]) 01/24/2018  . Chronic allergic rhinitis 01/24/2018  . Depression with anxiety 01/24/2018  . UTI due to Klebsiella species 01/24/2018  . Protein-calorie malnutrition, severe ([place]) 01/24/2018  . S/P insertion of IVC (inferior vena caval) filter 01/24/2018  . Reactive depression   . Hypokalemia   . Leukocytosis   . Essential hypertension   . Trauma   . 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([place])   . Neuropathic pain   . Neurogenic bowel   . Neurogenic bladder   . Benign essential HTN   . Acute blood loss anemia   . Central cord syndrome at C6 level of cervical spinal cord ([place]) 11/29/2017  . Central cord syndrome [facility]) 11/29/2017    Alanson Puls, PT DPT 10/31/2019, 1:17 PM  [place] MAIN [facility] SERVICES [address] [place], [place], [address] Phone: [phone]   Fax:  [phone]  Name: Sherry [name] MRN: [id number] Date of Birth: 03/23/1934

## 2019-10-31 NOTE — Therapy (Signed)
[place] MAIN [facility] SERVICES [address] [place], [place], [address] Phone: [phone]   Fax:  [phone]  Occupational Therapy Treatment  Patient Details  Name: Sherry [name] MRN: [id number] Date of Birth: 17-Dec-1934 No data recorded  Encounter Date: 10/29/2019   OT End of Session - 10/31/19 2207    Visit Number 6    Number of Visits 24    Date for OT Re-Evaluation 12/10/19    OT Start Time 1345    OT Stop Time 1430    OT Time Calculation (min) 45 min    Activity Tolerance Patient tolerated treatment well    Behavior During Therapy [facility] for tasks assessed/performed           Past Medical History:  Diagnosis Date  . Acute blood loss anemia   . Arthritis   . Cancer ([place])    skin  . Central cord syndrome at C6 level of cervical spinal cord ([place]) 11/29/2017  . Hypertension   . Protein-calorie malnutrition, severe ([place]) 01/24/2018  . S/P insertion of IVC (inferior vena caval) filter 01/24/2018  . 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[facility])     Past Surgical History:  Procedure Laterality Date  . ANTERIOR CERVICAL DECOMP/DISCECTOMY FUSION N/A 11/29/2017   Procedure: Cervical five-six, six-seven Anterior Cervical Decompression Fusion;  Surgeon: Judith Part, MD;  Location: [place];  Service: Neurosurgery;  Laterality: N/A;  Cervical five-six, six-seven Anterior Cervical Decompression Fusion  . CATARACT EXTRACTION    . IR IVC FILTER PLMT / S&I /IMG GUID/MOD SED  12/08/2017    There were no vitals filed for this visit.   Subjective Assessment - 10/31/19 2205    Subjective  Patient reports she is having a good day, no complaints and no pain    Pertinent History Patient s/p fall November 29, 2017 resulting in diagnosis of central cord syndrome at C 6 level.  She has had therapy in multiple venues but with recent move to [facility], therapy staff recommended she seek outpatient therapy for her needs.    Patient Stated Goals Patient would like to be  able to move better in bed, stand and perform self care tasks.    Currently in Pain? No/denies    Multiple Pain Sites No           PROM to bilateral UEs for shoulder flexion, ABD, ER, elbow flexion/extension, forearm supination/pronation, wrist flexion/ext, prolonged stretching to digits at all joints, MP, PIP and DIP. A/AROM for all the above movements in available ranges. Forward reaching patterns, lateral trunk flexion also performed for 10 reps.  Active finger flexion and extension in available ranges, patient with several joints with contractures in extension.   Use of medium ball, patient able to stabilize ball with use of bilateral hands, performing shoulder flexion for 2 sets of 10 reps, chest press, diagonal patterns with use of the ball alternating from one shoulder to the other.  Therapist demo and cues.    Focus on gross grasp and release patterns with right hand with larger objects, difficulty with left hand to attempt to grasp items but able to use to stabilize objects.     Response to tx:   Patient reports improvements in ROM, feeling stronger over the last few sessions.  Patient improving with right hand grasping patterns however remains limited with finger flexion, left hand used as an assist with tasks.  Continue to work towards goals in plan of care to improve ROM, strength and participation in functional  tasks, working towards greater independence with necessary daily activities.                        OT Education - 10/31/19 2207    Education Details HEP    Person(s) Educated Patient;Caregiver(s)    Methods Explanation;Demonstration    Comprehension Verbalized understanding;Need further instruction               OT Long Term Goals - 09/21/19 0929      OT LONG TERM GOAL #1   Title Patient and caregiver will demonstrate understanding of home exercise program for ROM.    Baseline no current program at eval    Time 6    Period Weeks     Status New    Target Date 10/29/19      OT LONG TERM GOAL #2   Title Patient will demonstrate ability to don shirt with min assist from seated position.    Baseline max at eval    Time 6    Period Weeks    Status New    Target Date 10/29/19      OT LONG TERM GOAL #3   Title Patient will demonstrate improved sitting balance at the edge of the bed to participate in self care tasks.    Baseline requires assist at eval    Time 12    Period Weeks    Status New    Target Date 12/10/19      OT LONG TERM GOAL #4   Title Patient will demonstrate improved composite finger flexion to hold deodorant to apply to underarms.    Baseline unable at eval    Time 12    Period Weeks    Status New    Target Date 12/10/19      OT LONG TERM GOAL #5   Title Pt will complete self feeding with setup, use of universal cuff and minimal spillage.    Baseline inconsistent with feeding and increased spillage.    Time 12    Period Weeks    Status New    Target Date 12/10/19      Long Term Additional Goals   Additional Long Term Goals Yes      OT LONG TERM GOAL #6   Title Patient will increase right UE ROM to comb the back of her hair with modified independence.    Baseline can comb the sides but not the back    Time 12    Period Weeks    Status New    Target Date 12/10/19                 Plan - 10/31/19 2208    Clinical Impression Statement Patient reports improvements in ROM, feeling stronger over the last few sessions.  Patient improving with right hand grasping patterns however remains limited with finger flexion, left hand used as an assist with tasks.  Continue to work towards goals in plan of care to improve ROM, strength and participation in functional tasks, working towards greater independence with necessary daily activities.    OT Occupational Profile and History Detailed Assessment- Review of Records and additional review of physical, cognitive, psychosocial history related to  current functional performance    Occupational performance deficits (Please refer to evaluation for details): ADL's;IADL's;Leisure;Social Participation    Body Structure / Function / Physical Skills ADL;Continence;Dexterity;Flexibility;Strength;ROM;Balance;Coordination;FMC;IADL;Endurance;UE functional use;Decreased knowledge of use of DME;GMC    Rehab Potential Fair    Clinical  Decision Making Limited treatment options, no task modification necessary    Comorbidities Affecting Occupational Performance: Presence of comorbidities impacting occupational performance    Comorbidities impacting occupational performance description: contractures of bilateral hands, dependent transfers,    Modification or Assistance to Complete Evaluation  Min-Moderate modification of tasks or assist with assess necessary to complete eval    OT Frequency 2x / week    OT Duration 12 weeks    OT Treatment/Interventions Self-care/ADL training;Cryotherapy;Therapeutic exercise;DME and/or AE instruction;Balance training;Neuromuscular education;Manual Therapy;Splinting;Moist Heat;Passive range of motion;Therapeutic activities;Patient/family education    Consulted and Agree with Plan of Care Patient           Patient will benefit from skilled therapeutic intervention in order to improve the following deficits and impairments:   Body Structure / Function / Physical Skills: ADL, Continence, Dexterity, Flexibility, Strength, ROM, Balance, Coordination, FMC, IADL, Endurance, UE functional use, Decreased knowledge of use of DME, GMC       Visit Diagnosis: Muscle weakness (generalized)  Other lack of coordination    Problem List Patient Active Problem List   Diagnosis Date Noted  . Acute appendicitis with appendiceal abscess 05/30/2018  . Hypocalcemia 02/15/2018  . Dysuria 02/15/2018  . Bilateral lower extremity edema 02/11/2018  . Vaginal yeast infection 01/30/2018  . Chest tightness 01/27/2018  .  Healthcare-associated pneumonia 01/25/2018  . Pleural effusion, not elsewhere classified 01/25/2018  . Acute deep vein thrombosis (DVT) of left lower extremity ([place]) 01/24/2018  . Acute deep vein thrombosis (DVT) of right lower extremity ([place]) 01/24/2018  . Chronic allergic rhinitis 01/24/2018  . Depression with anxiety 01/24/2018  . UTI due to Klebsiella species 01/24/2018  . Protein-calorie malnutrition, severe ([place]) 01/24/2018  . S/P insertion of IVC (inferior vena caval) filter 01/24/2018  . Reactive depression   . Hypokalemia   . Leukocytosis   . Essential hypertension   . Trauma   . 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([place])   . Neuropathic pain   . Neurogenic bowel   . Neurogenic bladder   . Benign essential HTN   . Acute blood loss anemia   . Central cord syndrome at C6 level of cervical spinal cord ([place]) 11/29/2017  . Central cord syndrome [facility]) 11/29/2017   Daton Szilagyi T Tomasita Morrow, OTR/L, CLT  Kaedan Richert 10/31/2019, 10:16 PM  [place] MAIN [facility] SERVICES [address] [place], [place], [address] Phone: [phone]   Fax:  [phone]  Name: Sherry [name] MRN: [id number] Date of Birth: [birth year]

## 2019-11-01 ENCOUNTER — Encounter: Payer: Self-pay | Admitting: Occupational Therapy

## 2019-11-01 NOTE — Therapy (Addendum)
[place] MAIN [facility] SERVICES [address] [place], [place], [address] Phone: [phone]   Fax:  [phone]  Occupational Therapy Treatment  Patient Details  Name: Sherry [name] MRN: [id number] Date of Birth: 1934-04-06 No data recorded  Encounter Date: 10/31/2019   OT End of Session - 11/01/19 2235    Visit Number 7    Number of Visits 24    Date for OT Re-Evaluation 12/10/19    OT Start Time 1346    OT Stop Time 1430    OT Time Calculation (min) 44 min    Activity Tolerance Patient tolerated treatment well    Behavior During Therapy [facility] for tasks assessed/performed           Past Medical History:  Diagnosis Date  . Acute blood loss anemia   . Arthritis   . Cancer ([place])    skin  . Central cord syndrome at C6 level of cervical spinal cord ([place]) 11/29/2017  . Hypertension   . Protein-calorie malnutrition, severe ([place]) 01/24/2018  . S/P insertion of IVC (inferior vena caval) filter 01/24/2018  . 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[facility])     Past Surgical History:  Procedure Laterality Date  . ANTERIOR CERVICAL DECOMP/DISCECTOMY FUSION N/A 11/29/2017   Procedure: Cervical five-six, six-seven Anterior Cervical Decompression Fusion;  Surgeon: Judith Part, MD;  Location: [place];  Service: Neurosurgery;  Laterality: N/A;  Cervical five-six, six-seven Anterior Cervical Decompression Fusion  . CATARACT EXTRACTION    . IR IVC FILTER PLMT / S&I /IMG GUID/MOD SED  12/08/2017    There were no vitals filed for this visit.    Therapeutic Exercise: PROM to bilateral UEs for shoulder flexion, ABD, ER, elbow flexion/extension, forearm supination/pronation, wrist flexion/ext, prolonged stretching to digits at all joints, MP, PIP and  DIP.  A/AROM for all the above movements in available ranges.  Forward reaching patterns, lateral trunk flexion also performed for 10 reps.    Active finger flexion and extension in available ranges, patient with several  joints with contractures in extension, prolonged stretching in preparation for use in active tasks this date.   Use of medium ball, patient able to stabilize ball with use of bilateral hands, performing shoulder flexion for 2 sets of 10 reps, chest press, diagonal patterns with use of the ball alternating from one shoulder to the other.  Therapist demo and cues.     ADL:   Focused on grasp and release patterns with use of medication bottles of various sizes, shapes and a variety of tops.  Patient did well with tops that are not child resistant, flip tops and screw tops.  She is able to stabilize with one hand while managing the top with the other hand.  She was not able to perform any of the child safety locked medication bottles and lacks sufficient hand skills to do so at this time.     Response to tx:   Patient has continued to make progress with ROM of bilateral UEs at the shoulder, elbow, wrist and hands.  She was able to work towards functional task this date of managing medication bottles.  She performed best with larger bottles with screw top or flip top, non child resistant bottles.  She is not able to do any child safety lock bottles at this time.  Patient beginning to use each hand as a stabilizer and right hand more with grasping and attempts with holding/manipulation of objects like the medication bottles today.  Continue OT to  maximize safety and independence in ADL and IADL tasks.                            OT Long Term Goals - 09/21/19 5621      OT LONG TERM GOAL #1   Title Patient and caregiver will demonstrate understanding of home exercise program for ROM.    Baseline no current program at eval    Time 6    Period Weeks    Status New    Target Date 10/29/19      OT LONG TERM GOAL #2   Title Patient will demonstrate ability to don shirt with min assist from seated position.    Baseline max at eval    Time 6    Period Weeks    Status New    Target  Date 10/29/19      OT LONG TERM GOAL #3   Title Patient will demonstrate improved sitting balance at the edge of the bed to participate in self care tasks.    Baseline requires assist at eval    Time 12    Period Weeks    Status New    Target Date 12/10/19      OT LONG TERM GOAL #4   Title Patient will demonstrate improved composite finger flexion to hold deodorant to apply to underarms.    Baseline unable at eval    Time 12    Period Weeks    Status New    Target Date 12/10/19      OT LONG TERM GOAL #5   Title Pt will complete self feeding with setup, use of universal cuff and minimal spillage.    Baseline inconsistent with feeding and increased spillage.    Time 12    Period Weeks    Status New    Target Date 12/10/19      Long Term Additional Goals   Additional Long Term Goals Yes      OT LONG TERM GOAL #6   Title Patient will increase right UE ROM to comb the back of her hair with modified independence.    Baseline can comb the sides but not the back    Time 12    Period Weeks    Status New    Target Date 12/10/19                Plan - 11/01/19 2208         Clinical Impression Statement Patient reports improvements in ROM, feeling stronger over the last few sessions.  Patient improving with right hand grasping patterns however remains limited with finger flexion, left hand used as an assist with tasks.  Continue to work towards goals in plan of care to improve ROM, strength and participation in functional tasks, working towards greater independence with necessary daily activities.     OT Occupational Profile and History Detailed Assessment- Review of Records and additional review of physical, cognitive, psychosocial history related to current functional performance     Occupational performance deficits (Please refer to evaluation for details): ADL's;IADL's;Leisure;Social Participation     Body Structure / Function / Physical Skills  ADL;Continence;Dexterity;Flexibility;Strength;ROM;Balance;Coordination;FMC;IADL;Endurance;UE functional use;Decreased knowledge of use of DME;GMC     Rehab Potential Fair     Clinical Decision Making Limited treatment options, no task modification necessary     Comorbidities Affecting Occupational Performance: Presence of comorbidities impacting occupational performance     Comorbidities impacting occupational performance  description: contractures of bilateral hands, dependent transfers,     Modification or Assistance to Complete Evaluation  Min-Moderate modification of tasks or assist with assess necessary to complete eval     OT Frequency 2x / week     OT Duration 12 weeks     OT Treatment/Interventions Self-care/ADL training;Cryotherapy;Therapeutic exercise;DME and/or AE instruction;Balance training;Neuromuscular education;Manual Therapy;Splinting;Moist Heat;Passive range of motion;Therapeutic activities;Patient/family education     Consulted and Agree with Plan of Care Patient              Patient will benefit from skilled therapeutic intervention in order to improve the following deficits and impairments:   Body Structure / Function / Physical Skills: ADL, Continence, Dexterity, Flexibility, Strength, ROM, Balance, Coordination, FMC, IADL, Endurance, UE functional use, Decreased knowledge of use of DME, GMC       Visit Diagnosis: Muscle weakness (generalized)  Other lack of coordination  Central cord syndrome, sequela ([place])    Problem List Patient Active Problem List   Diagnosis Date Noted  . Acute appendicitis with appendiceal abscess 05/30/2018  . Hypocalcemia 02/15/2018  . Dysuria 02/15/2018  . Bilateral lower extremity edema 02/11/2018  . Vaginal yeast infection 01/30/2018  . Chest tightness 01/27/2018  . Healthcare-associated pneumonia 01/25/2018  . Pleural effusion, not elsewhere classified 01/25/2018  . Acute deep vein thrombosis (DVT) of left lower  extremity ([place]) 01/24/2018  . Acute deep vein thrombosis (DVT) of right lower extremity ([place]) 01/24/2018  . Chronic allergic rhinitis 01/24/2018  . Depression with anxiety 01/24/2018  . UTI due to Klebsiella species 01/24/2018  . Protein-calorie malnutrition, severe ([place]) 01/24/2018  . S/P insertion of IVC (inferior vena caval) filter 01/24/2018  . Reactive depression   . Hypokalemia   . Leukocytosis   . Essential hypertension   . Trauma   . 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([place])   . Neuropathic pain   . Neurogenic bowel   . Neurogenic bladder   . Benign essential HTN   . Acute blood loss anemia   . Central cord syndrome at C6 level of cervical spinal cord ([place]) 11/29/2017  . Central cord syndrome [facility]) 11/29/2017   Patrice Moates T Tomasita Morrow, OTR/L, CLT  Daneisha Surges 11/01/2019, 10:36 PM  [place] MAIN [facility] SERVICES [address] [place], [place], [address] Phone: [phone]   Fax:  [phone]  Name: Sherry [name] MRN: [id number] Date of Birth: February 25, 1934

## 2019-11-06 ENCOUNTER — Ambulatory Visit: Payer: Medicare HMO | Admitting: Occupational Therapy

## 2019-11-06 ENCOUNTER — Other Ambulatory Visit: Payer: Self-pay

## 2019-11-06 ENCOUNTER — Encounter: Payer: Self-pay | Admitting: Occupational Therapy

## 2019-11-06 ENCOUNTER — Ambulatory Visit: Payer: Medicare HMO | Attending: Internal Medicine

## 2019-11-06 DIAGNOSIS — S14129S Central cord syndrome at unspecified level of cervical spinal cord, sequela: Secondary | ICD-10-CM | POA: Diagnosis present

## 2019-11-06 DIAGNOSIS — R278 Other lack of coordination: Secondary | ICD-10-CM | POA: Diagnosis present

## 2019-11-06 DIAGNOSIS — R2689 Other abnormalities of gait and mobility: Secondary | ICD-10-CM | POA: Insufficient documentation

## 2019-11-06 DIAGNOSIS — M6281 Muscle weakness (generalized): Secondary | ICD-10-CM

## 2019-11-06 NOTE — Therapy (Signed)
[place] MAIN [facility] SERVICES [address] [place], [place], [address] Phone: [phone]   Fax:  [phone]  Occupational Therapy Treatment  Patient Details  Name: Sherry [name] MRN: [id number] Date of Birth: [birth year] No data recorded  Encounter Date: 11/06/2019   OT End of Session - 11/06/19 1215    Visit Number 8    Number of Visits 24    Date for OT Re-Evaluation 12/10/19    OT Start Time 3532    OT Stop Time 1100    OT Time Calculation (min) 45 min    Activity Tolerance Patient tolerated treatment well    Behavior During Therapy [facility] for tasks assessed/performed           Past Medical History:  Diagnosis Date   Acute blood loss anemia    Arthritis    Cancer ([place])    skin   Central cord syndrome at C6 level of cervical spinal cord ([place]) 11/29/2017   Hypertension    Protein-calorie malnutrition, severe ([place]) 01/24/2018   S/P insertion of IVC (inferior vena caval) filter 01/24/2018   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([place])     Past Surgical History:  Procedure Laterality Date   ANTERIOR CERVICAL DECOMP/DISCECTOMY FUSION N/A 11/29/2017   Procedure: Cervical five-six, six-seven Anterior Cervical Decompression Fusion;  Surgeon: Judith Part, MD;  Location: [place];  Service: Neurosurgery;  Laterality: N/A;  Cervical five-six, six-seven Anterior Cervical Decompression Fusion   CATARACT EXTRACTION     IR IVC FILTER PLMT / S&I /IMG GUID/MOD SED  12/08/2017    There were no vitals filed for this visit.   Subjective Assessment - 11/06/19 1215    Subjective  Pt. reports that she is having a good day    Pertinent History Patient s/p fall November 29, 2017 resulting in diagnosis of central cord syndrome at C 6 level.  She has had therapy in multiple venues but with recent move to [facility], therapy staff recommended she seek outpatient therapy for her needs.    Patient Stated Goals Patient would like to be able to move better in bed,  stand and perform self care tasks.    Currently in Pain? No/denies           OT Treatment  Pt. Tolerated bilateral shoulder flexion, extension, abduction, elbow flexion, extension forearm supination AAROM/AROM. PROM bilateral wrist flexion, and extension.  Pt. Worked on  Bilateral simultaneous AROM using a medium sized ball for bilateral shoulder flexion, abduction, diagonal, and circular motion. No reports of pain or discomfort with ROM.   Pt. Continues to engage her right hand when performing self-feeding with adaptive equipment. Pt. reports that she is able to use her right thumb to hold items. Pt. Tolerated ROM well without reports of pain, or discomfort. Pt. Continues to benefit from working on improving ROM in order to work towards increasing engagement of her bilateral hands during ADLs, and IADL tasks.                          OT Education - 11/06/19 1215    Education Details HEP    Person(s) Educated Patient;Caregiver(s)    Methods Explanation;Demonstration    Comprehension Verbalized understanding;Need further instruction               OT Long Term Goals - 09/21/19 0929      OT LONG TERM GOAL #1   Title Patient and caregiver will demonstrate understanding of home  exercise program for ROM.    Baseline no current program at eval    Time 6    Period Weeks    Status New    Target Date 10/29/19      OT LONG TERM GOAL #2   Title Patient will demonstrate ability to don shirt with min assist from seated position.    Baseline max at eval    Time 6    Period Weeks    Status New    Target Date 10/29/19      OT LONG TERM GOAL #3   Title Patient will demonstrate improved sitting balance at the edge of the bed to participate in self care tasks.    Baseline requires assist at eval    Time 12    Period Weeks    Status New    Target Date 12/10/19      OT LONG TERM GOAL #4   Title Patient will demonstrate improved composite finger flexion to hold  deodorant to apply to underarms.    Baseline unable at eval    Time 12    Period Weeks    Status New    Target Date 12/10/19      OT LONG TERM GOAL #5   Title Pt will complete self feeding with setup, use of universal cuff and minimal spillage.    Baseline inconsistent with feeding and increased spillage.    Time 12    Period Weeks    Status New    Target Date 12/10/19      Long Term Additional Goals   Additional Long Term Goals Yes      OT LONG TERM GOAL #6   Title Patient will increase right UE ROM to comb the back of her hair with modified independence.    Baseline can comb the sides but not the back    Time 12    Period Weeks    Status New    Target Date 12/10/19                 Plan - 11/06/19 1216    Clinical Impression Statement Pt. Continues to engage her right hand when performing self-feeding with adaptive equipment. Pt. reports that she is able to use her right thumb to hold items. Pt. Tolerated ROM well without reports of pain, or discomfort. Pt. Continues to benefit from working on improving ROM in order to work towards increasing engagement of her bilateral hands during ADLs, and IADL tasks.    OT Occupational Profile and History Detailed Assessment- Review of Records and additional review of physical, cognitive, psychosocial history related to current functional performance    Occupational performance deficits (Please refer to evaluation for details): ADL's;IADL's;Leisure;Social Participation    Body Structure / Function / Physical Skills ADL;Continence;Dexterity;Flexibility;Strength;ROM;Balance;Coordination;FMC;IADL;Endurance;UE functional use;Decreased knowledge of use of DME;GMC    Psychosocial Skills Environmental  Adaptations;Habits;Routines and Behaviors    Rehab Potential Fair    Clinical Decision Making Limited treatment options, no task modification necessary    Comorbidities Affecting Occupational Performance: Presence of comorbidities impacting  occupational performance    Comorbidities impacting occupational performance description: contractures of bilateral hands, dependent transfers,    Modification or Assistance to Complete Evaluation  Min-Moderate modification of tasks or assist with assess necessary to complete eval    OT Frequency 2x / week    OT Duration 12 weeks    OT Treatment/Interventions Self-care/ADL training;Cryotherapy;Therapeutic exercise;DME and/or AE instruction;Balance training;Neuromuscular education;Manual Therapy;Splinting;Moist Heat;Passive range of motion;Therapeutic activities;Patient/family education  Consulted and Agree with Plan of Care Patient           Patient will benefit from skilled therapeutic intervention in order to improve the following deficits and impairments:   Body Structure / Function / Physical Skills: ADL, Continence, Dexterity, Flexibility, Strength, ROM, Balance, Coordination, FMC, IADL, Endurance, UE functional use, Decreased knowledge of use of DME, GMC   Psychosocial Skills: Environmental  Adaptations, Habits, Routines and Behaviors   Visit Diagnosis: Muscle weakness (generalized)  Other lack of coordination    Problem List Patient Active Problem List   Diagnosis Date Noted   Acute appendicitis with appendiceal abscess 05/30/2018   Hypocalcemia 02/15/2018   Dysuria 02/15/2018   Bilateral lower extremity edema 02/11/2018   Vaginal yeast infection 01/30/2018   Chest tightness 01/27/2018   Healthcare-associated pneumonia 01/25/2018   Pleural effusion, not elsewhere classified 01/25/2018   Acute deep vein thrombosis (DVT) of left lower extremity ([place]) 01/24/2018   Acute deep vein thrombosis (DVT) of right lower extremity ([place]) 01/24/2018   Chronic allergic rhinitis 01/24/2018   Depression with anxiety 01/24/2018   UTI due to Klebsiella species 01/24/2018   Protein-calorie malnutrition, severe ([place]) 01/24/2018   S/P insertion of IVC (inferior vena caval)  filter 01/24/2018   Reactive depression    Hypokalemia    Leukocytosis    Essential hypertension    Trauma    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([facility])    Neuropathic pain    Neurogenic bowel    Neurogenic bladder    Benign essential HTN    Acute blood loss anemia    Central cord syndrome at C6 level of cervical spinal cord ([place]) 11/29/2017   Central cord syndrome ([place]) 11/29/2017    Harrel Carina, MS, OTR/L 11/06/2019, 12:18 PM  [place] MAIN [facility] SERVICES [address] [place], [place], [address] Phone: [phone]   Fax:  [phone]  Name: Sherry [name] MRN: [id number] Date of Birth: 12/22/1934

## 2019-11-06 NOTE — Therapy (Signed)
[place] MAIN [facility] SERVICES [address] [place], [place], [address] Phone: [phone]   Fax:  [phone]  Physical Therapy Treatment  Patient Details  Name: Sherry [name] MRN: [id number] Date of Birth: Apr 30, 1934 Referring Provider (PT): Viviana Simpler   Encounter Date: 11/06/2019   PT End of Session - 11/06/19 1158    Visit Number 14    Number of Visits 16    Date for PT Re-Evaluation 01/14/20    Authorization Type aetna medicare FOTO performed by PT on eval (7/20), score 12    PT Start Time 0930    PT Stop Time 1015    PT Time Calculation (min) 45 min    Equipment Utilized During Treatment Other (comment)   Hoyer lift   Activity Tolerance Patient tolerated treatment well           Past Medical History:  Diagnosis Date   Acute blood loss anemia    Arthritis    Cancer ([place])    skin   Central cord syndrome at C6 level of cervical spinal cord ([place]) 11/29/2017   Hypertension    Protein-calorie malnutrition, severe ([place]) 01/24/2018   S/P insertion of IVC (inferior vena caval) filter 01/24/2018   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([place])     Past Surgical History:  Procedure Laterality Date   ANTERIOR CERVICAL DECOMP/DISCECTOMY FUSION N/A 11/29/2017   Procedure: Cervical five-six, six-seven Anterior Cervical Decompression Fusion;  Surgeon: Judith Part, MD;  Location: [place];  Service: Neurosurgery;  Laterality: N/A;  Cervical five-six, six-seven Anterior Cervical Decompression Fusion   CATARACT EXTRACTION     IR IVC FILTER PLMT / S&I /IMG GUID/MOD SED  12/08/2017    There were no vitals filed for this visit.   Subjective Assessment - 11/06/19 1156    Subjective Pt stated that she is doing well today.  No updates in health since last PT visit.  Reports no falls.  She has recently received L foot brace to place foot in more dorsiflexion.  Reports no soreness or discomfort with new brace.    Pertinent History Pt is an 85 yo  female that fell in 2019, fractured vertebrae in her neck and in her low back per family. 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 After hospital stay, pt discharged to [facility] for ~1 month, experienced severe UTI as well as bilateral DVT (IVC filter placed). Discharged to [facility], stayed for about a year, and then moved to [facility] in April 2021. Was receiving PT, but per family facility reported that did not have adequate equipment to maximize PT for her. Pt until about 1 month ago was practicing sit to stand transfers with 1-2 people, and working on static standing. Has a brace for L foot due to PF. Pt currently needs assistance with all ADLs (able to assist with donning/doffing her shirt), bed baths, and needs a hoyer lift for transfers. Able to drive power wheelchair without assistance.    Limitations Standing;Walking;House hold activities;Lifting    How long can you sit comfortably? NA    How long can you stand comfortably? unable    How long can you walk comfortably? unable    Currently in Pain? No/denies               Ther-ex Hoyer lift from power wc <>mat; Supine hip abduction 2 x 10BLE, with manual resistance;  Supine hip adduction 2 x 10BLE, with manual resistance; Supine SLR with assist 2 x 10 BLE; Supine  quad and glut sets 3s hold 2 x 10 BLE; Hooklying heel slides with resisted extension 2 x10 bilateral lower extremity; Hooklying marching 2 x 10BLE, assist required forBLE; Hooklying clams with light manual resistance 2 x 10 BLE; Hooklying adductor squeezes with manual resistance 2 x 10 BLE; Hooklying SAQ over bolster with light manual resistance for LLE, assist for RLE to get to end range extension 2 x 10BLE, muscle tapping at R quad to encourage improved muscle activation;  Hooklying bridges over bolster under knees 3s hold 2 x10;     Pt educated throughout session about proper posture and technique with exercises. Improved exercise  technique, movement at target joints, use of target muscles after min to mod verbal, visual, tactile cues.   She is able to complete all exercises as instructed today.  Performed 2 sets of each exercise. Continue to work on [place] with rolling left and right at next visit. Will plan on practicing standing with the lite gait next session.Patient encouraged to continue her HEP and follow-up as scheduled. She will continue to benefit from additional skilled physical therapy services to continue address deficits in strength, balance, and functional mobility.                         PT Education - 11/06/19 1157    Education Details HEP; exercise form    Person(s) Educated Patient    Methods Explanation    Comprehension Verbalized understanding            PT Short Term Goals - 10/22/19 1313      PT SHORT TERM GOAL #1   Title The patient will perform initial HEP with minimum assistance in order to improve strength and function.    Time 4    Period Weeks    Status On-going    Target Date 12/03/19             PT Long Term Goals - 10/22/19 1313      PT LONG TERM GOAL #1   Title The patient will be compliant with finalized HEP with minimum assistance in preparation for self management and maintenance of condition.    Time 12    Period Weeks    Status On-going    Target Date 01/14/20      PT LONG TERM GOAL #2   Title The patient will demonstrate at least 10 point improvement on FOTO score indicating an improved ability to perform functional activities.    Baseline on eval (/720) score 12; 10/22/19: 12    Time 12    Period Weeks    Status On-going    Target Date 01/14/20      PT LONG TERM GOAL #3   Title The patient will demonstrate lateral scooting  for assistance with transfers with minA to maximize independence.    Baseline 10/22/19: Pt requires modA+1 for lateral scooting    Time 8    Period Weeks    Status On-going    Target Date 01/14/20        PT LONG TERM GOAL #4   Title The patient will demonstrate a squat pivot transfer with minimum assistance to maximize independence and mobility.    Baseline 10/22/19: unable to safely attempt at this time    Time 12    Period Weeks    Status On-going    Target Date 01/14/20      PT LONG TERM GOAL #5   Title The patient  will demonstrate sitting without UE support for 2-5 minutes at EOB to improve participation and maximize independence with ADLs.    Baseline 10/22/19: Pt able ot sit unsupported for at least 2 minutes at edge of bed    Time 12    Period Weeks    Status Achieved    Target Date --                  Patient will benefit from skilled therapeutic intervention in order to improve the following deficits and impairments:     Visit Diagnosis: Muscle weakness (generalized)  Other lack of coordination  Central cord syndrome, sequela ([place])     Problem List Patient Active Problem List   Diagnosis Date Noted   Acute appendicitis with appendiceal abscess 05/30/2018   Hypocalcemia 02/15/2018   Dysuria 02/15/2018   Bilateral lower extremity edema 02/11/2018   Vaginal yeast infection 01/30/2018   Chest tightness 01/27/2018   Healthcare-associated pneumonia 01/25/2018   Pleural effusion, not elsewhere classified 01/25/2018   Acute deep vein thrombosis (DVT) of left lower extremity ([place]) 01/24/2018   Acute deep vein thrombosis (DVT) of right lower extremity ([place]) 01/24/2018   Chronic allergic rhinitis 01/24/2018   Depression with anxiety 01/24/2018   UTI due to Klebsiella species 01/24/2018   Protein-calorie malnutrition, severe ([place]) 01/24/2018   S/P insertion of IVC (inferior vena caval) filter 01/24/2018   Reactive depression    Hypokalemia    Leukocytosis    Essential hypertension    Trauma    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(HCC)    Neuropathic pain    Neurogenic bowel    Neurogenic bladder    Benign essential HTN    Acute blood loss anemia     Central cord syndrome at C6 level of cervical spinal cord ([place]) 11/29/2017   Central cord syndrome ([place]) 11/29/2017    Treazure Nery, MPT 11/06/2019, 12:00 PM  [place] MAIN [facility] SERVICES [address] [place], [place], [address] Phone: [phone]   Fax:  [phone]  Name: Sherry [name] MRN: [id number] Date of Birth: 12/29/1934

## 2019-11-07 ENCOUNTER — Other Ambulatory Visit: Payer: Self-pay

## 2019-11-07 ENCOUNTER — Ambulatory Visit: Payer: Medicare HMO | Admitting: Occupational Therapy

## 2019-11-07 ENCOUNTER — Ambulatory Visit: Payer: Medicare HMO

## 2019-11-07 DIAGNOSIS — M6281 Muscle weakness (generalized): Secondary | ICD-10-CM

## 2019-11-07 DIAGNOSIS — R2689 Other abnormalities of gait and mobility: Secondary | ICD-10-CM

## 2019-11-07 DIAGNOSIS — R278 Other lack of coordination: Secondary | ICD-10-CM

## 2019-11-07 DIAGNOSIS — S14129S Central cord syndrome at unspecified level of cervical spinal cord, sequela: Secondary | ICD-10-CM

## 2019-11-07 NOTE — Therapy (Signed)
[place] MAIN [facility] SERVICES [address] [place], [place], [address] Phone: [phone]   Fax:  [phone]  Physical Therapy Treatment  Patient Details  Name: ANELLE PARLOW MRN: [id number] Date of Birth: 1934/06/12 Referring Provider (PT): Viviana Simpler   Encounter Date: 11/07/2019   PT End of Session - 11/07/19 1654    Visit Number 15    Number of Visits 16    Date for PT Re-Evaluation 01/14/20    Authorization Type aetna medicare FOTO performed by PT on eval (7/20), score 12    PT Start Time 1432    PT Stop Time 1515    PT Time Calculation (min) 43 min    Activity Tolerance Patient tolerated treatment well;No increased pain;Patient limited by fatigue    Behavior During Therapy [facility] for tasks assessed/performed           Past Medical History:  Diagnosis Date  . Acute blood loss anemia   . Arthritis   . Cancer ([place])    skin  . Central cord syndrome at C6 level of cervical spinal cord ([place]) 11/29/2017  . Hypertension   . Protein-calorie malnutrition, severe ([place]) 01/24/2018  . S/P insertion of IVC (inferior vena caval) filter 01/24/2018  . 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[facility])     Past Surgical History:  Procedure Laterality Date  . ANTERIOR CERVICAL DECOMP/DISCECTOMY FUSION N/A 11/29/2017   Procedure: Cervical five-six, six-seven Anterior Cervical Decompression Fusion;  Surgeon: Judith Part, MD;  Location: [place];  Service: Neurosurgery;  Laterality: N/A;  Cervical five-six, six-seven Anterior Cervical Decompression Fusion  . CATARACT EXTRACTION    . IR IVC FILTER PLMT / S&I /IMG GUID/MOD SED  12/08/2017    There were no vitals filed for this visit.   Subjective Assessment - 11/07/19 1649    Subjective Pt reports doing fine today. Pt says she was here for PT yesterday, and today has no pain, soreness, fatigue to speak of.    Patient is accompained by: --   [name] (aide)   Pertinent History Pt is an 85 yo female that fell in  2019, fractured vertebrae in her neck and in her low back per family. 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 After hospital stay, pt discharged to CIR for ~1 month, experienced severe UTI as well as bilateral DVT (IVC filter placed). Discharged to [facility], stayed for about a year, and then moved to [facility] in April 2021. Was receiving PT, but per family facility reported that did not have adequate equipment to maximize PT for her. Pt until about 1 month ago was practicing sit to stand transfers with 1-2 people, and working on static standing. Has a brace for L foot due to PF. Pt currently needs assistance with all ADLs (able to assist with donning/doffing her shirt), bed baths, and needs a hoyer lift for transfers. Able to drive power wheelchair without assistance.    Currently in Pain? No/denies          INTERVENTION THIS DATE: Harrel Lemon transfer from Power [facility] to hi/low mat, assist from aide, [name].   Right ankle DF stretch 2x30secH (P/ROM ) Hooklying bridge 1x15bilat (no clearnance) Hooklying heel slides 1x15 bilat Hooklying marching 2 x 15 BLE, assist required for BLE; MaxA Rt, modA left  Hooklying SAQ 1x10 Right modA, 1x15 Left (supervision) Manuallty resisted leg press 1x10 Rt (min resistance); 1x15 Left (mod resistance)  Hooklying clams with light manual resistance 1 x 10 RLE, 1x15 LLE Hooklying adductor  squeezes with manual resistance 1x15 BLE; BLE Hip ABDCT heel slides 1x10 Rt; (maxA); 1x15 Left (Min-modA)  *significant BLE adaptive shortening of adductor longus which limits ABDCT ROM   -Hoyer transfer from hi/low mat to [employer], assist from aide, [place].       PT Short Term Goals - 10/22/19 1313      PT SHORT TERM GOAL #1   Title The patient will perform initial HEP with minimum assistance in order to improve strength and function.    Time 4    Period Weeks    Status On-going    Target Date 12/03/19             PT Long Term Goals - 10/22/19  1313      PT LONG TERM GOAL #1   Title The patient will be compliant with finalized HEP with minimum assistance in preparation for self management and maintenance of condition.    Time 12    Period Weeks    Status On-going    Target Date 01/14/20      PT LONG TERM GOAL #2   Title The patient will demonstrate at least 10 point improvement on FOTO score indicating an improved ability to perform functional activities.    Baseline on eval (/720) score 12; 10/22/19: 12    Time 12    Period Weeks    Status On-going    Target Date 01/14/20      PT LONG TERM GOAL #3   Title The patient will demonstrate lateral scooting  for assistance with transfers with minA to maximize independence.    Baseline 10/22/19: Pt requires modA+1 for lateral scooting    Time 8    Period Weeks    Status On-going    Target Date 01/14/20      PT LONG TERM GOAL #4   Title The patient will demonstrate a squat pivot transfer with minimum assistance to maximize independence and mobility.    Baseline 10/22/19: unable to safely attempt at this time    Time 12    Period Weeks    Status On-going    Target Date 01/14/20      PT LONG TERM GOAL #5   Title The patient will demonstrate sitting without UE support for 2-5 minutes at EOB to improve participation and maximize independence with ADLs.    Baseline 10/22/19: Pt able ot sit unsupported for at least 2 minutes at edge of bed    Time 12    Period Weeks    Status Achieved    Target Date --                 Plan - 11/07/19 1654    Clinical Impression Statement Continued with current POC, large focus on supine/hooklying isolated strengthening. As typical presentation, pt has more noticable weakness on RLE, requires minA-maxA for most exercises, less assist required on the LLE. Pt does well from a fatiguability standpoint, able to [facility] consistent effort with majority of exercises. Did not have time to get to short sitting or assisted standing. Noted substantial  shortening of the bllat adductor longus R>L, creates significant resistance against hip ABDCT, would be benefitial to add stretching into POC.    Personal Factors and Comorbidities Age;Time since onset of injury/illness/exacerbation;Comorbidity 3+;Fitness    Comorbidities HTN, quadriparesis, history of DVT, neurogenic bladder    Examination-Activity Limitations Bathing;Hygiene/Grooming;Squat;Bed Mobility;Lift;Bend;Stand;Engineer, manufacturing;Toileting;Self Feeding;Transfers;Continence;Sit;Dressing;Sleep;Carry    Examination-Participation Restrictions Church;Laundry;Cleaning;Medication Management;Community Activity;Meal Prep;Interpersonal Relationship    Stability/Clinical Decision  Making Evolving/Moderate complexity    Clinical Decision Making Moderate    Rehab Potential Fair    PT Frequency 2x / week    PT Duration 12 weeks    PT Treatment/Interventions ADLs/Self Care Home Management;Electrical Stimulation;Therapeutic activities;Wheelchair mobility training;Vasopneumatic Device;Joint Manipulations;Vestibular;Passive range of motion;Patient/family education;Therapeutic exercise;DME Instruction;Biofeedback;Aquatic Therapy;Moist Heat;Gait training;Balance training;Orthotic Fit/Training;Dry needling;Energy conservation;Taping;Splinting;Neuromuscular re-education;Cryotherapy;Ultrasound;Functional mobility training    PT Next Visit Plan platform attachments for RW to assess standing? transfers, sitting balance/endurance    PT Home Exercise Plan sitting balance unsupported hold 30-45sec with aide    Consulted and Agree with Plan of Care Patient    Family Member [place]           Patient will benefit from skilled therapeutic intervention in order to improve the following deficits and impairments:  Abnormal gait, Decreased balance, Decreased endurance, Decreased mobility, Difficulty walking, Hypomobility, Impaired sensation, Decreased range of motion, Improper body mechanics,  Impaired perceived functional ability, Decreased activity tolerance, Decreased knowledge of use of DME, Decreased safety awareness, Decreased strength, Impaired flexibility, Impaired UE functional use, Postural dysfunction  Visit Diagnosis: Muscle weakness (generalized)  Other lack of coordination  Central cord syndrome, sequela (HCC)  Other abnormalities of gait and mobility     Problem List Patient Active Problem List   Diagnosis Date Noted  . Acute appendicitis with appendiceal abscess 05/30/2018  . Hypocalcemia 02/15/2018  . Dysuria 02/15/2018  . Bilateral lower extremity edema 02/11/2018  . Vaginal yeast infection 01/30/2018  . Chest tightness 01/27/2018  . Healthcare-associated pneumonia 01/25/2018  . Pleural effusion, not elsewhere classified 01/25/2018  . Acute deep vein thrombosis (DVT) of left lower extremity ([place]) 01/24/2018  . Acute deep vein thrombosis (DVT) of right lower extremity ([place]) 01/24/2018  . Chronic allergic rhinitis 01/24/2018  . Depression with anxiety 01/24/2018  . UTI due to Klebsiella species 01/24/2018  . Protein-calorie malnutrition, severe ([place]) 01/24/2018  . S/P insertion of IVC (inferior vena caval) filter 01/24/2018  . Reactive depression   . Hypokalemia   . Leukocytosis   . Essential hypertension   . Trauma   . 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([place])   . Neuropathic pain   . Neurogenic bowel   . Neurogenic bladder   . Benign essential HTN   . Acute blood loss anemia   . Central cord syndrome at C6 level of cervical spinal cord ([place]) 11/29/2017  . Central cord syndrome ([place]) 11/29/2017   5:10 PM, 11/07/19 Etta Grandchild, PT, DPT Physical Therapist - [facility]  Outpatient Physical Therapy- [place] [facility] 11/07/2019, 5:07 PM  [place] MAIN [facility] SERVICES [address] [place], [place], [address] Phone: [phone]   Fax:  [phone]  Name:  JAVAE BRAATEN MRN: [id number] Date of Birth: [birth year]

## 2019-11-08 ENCOUNTER — Encounter: Payer: Self-pay | Admitting: Occupational Therapy

## 2019-11-08 NOTE — Therapy (Signed)
[place] MAIN [facility] SERVICES [address] [place], [place], [address] Phone: [phone]   Fax:  [phone]  Occupational Therapy Treatment  Patient Details  Name: Sherry [name] MRN: [id number] Date of Birth: Dec 29, 1934 No data recorded  Encounter Date: 11/07/2019   OT End of Session - 11/08/19 0931    Visit Number 9    Number of Visits 24    Date for OT Re-Evaluation 12/10/19    Activity Tolerance Patient tolerated treatment well    Behavior During Therapy [facility] for tasks assessed/performed           Past Medical History:  Diagnosis Date  . Acute blood loss anemia   . Arthritis   . Cancer ([place])    skin  . Central cord syndrome at C6 level of cervical spinal cord ([place]) 11/29/2017  . Hypertension   . Protein-calorie malnutrition, severe ([place]) 01/24/2018  . S/P insertion of IVC (inferior vena caval) filter 01/24/2018  . 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[facility])     Past Surgical History:  Procedure Laterality Date  . ANTERIOR CERVICAL DECOMP/DISCECTOMY FUSION N/A 11/29/2017   Procedure: Cervical five-six, six-seven Anterior Cervical Decompression Fusion;  Surgeon: Judith Part, MD;  Location: [place];  Service: Neurosurgery;  Laterality: N/A;  Cervical five-six, six-seven Anterior Cervical Decompression Fusion  . CATARACT EXTRACTION    . IR IVC FILTER PLMT / S&I /IMG GUID/MOD SED  12/08/2017    There were no vitals filed for this visit.  OT Treatment  Pt. tolerated bilateral shoulder flexion, extension, abduction, elbow flexion, extension forearm supination AAROM/AROM. PROM bilateral wrist flexion, and extension.  Pt. worked on bilateral simultaneous AROM using a medium sized circular sphere for bilateral shoulder flexion, abduction, diagonal, and circular motion. No reports of pain or discomfort with ROM.  Pt. continues to improve with, and tolerate ROM, and Pt. tolerated ROM well without reports of pain, or discomfort.  Pt. was able to  maintain hold of the circular sphere between her hands while performing ROM. Pt. continues to benefit from working on improving ROM in order to work towards increasing engagement of her bilateral hands during ADLs, and IADL tasks.                             OT Education - 11/08/19 0930    Education Details HEP    Person(s) Educated Patient;Caregiver(s)    Methods Explanation;Demonstration    Comprehension Verbalized understanding;Need further instruction               OT Long Term Goals - 09/21/19 0929      OT LONG TERM GOAL #1   Title Patient and caregiver will demonstrate understanding of home exercise program for ROM.    Baseline no current program at eval    Time 6    Period Weeks    Status New    Target Date 10/29/19      OT LONG TERM GOAL #2   Title Patient will demonstrate ability to don shirt with min assist from seated position.    Baseline max at eval    Time 6    Period Weeks    Status New    Target Date 10/29/19      OT LONG TERM GOAL #3   Title Patient will demonstrate improved sitting balance at the edge of the bed to participate in self care tasks.    Baseline requires assist at eval  Time 12    Period Weeks    Status New    Target Date 12/10/19      OT LONG TERM GOAL #4   Title Patient will demonstrate improved composite finger flexion to hold deodorant to apply to underarms.    Baseline unable at eval    Time 12    Period Weeks    Status New    Target Date 12/10/19      OT LONG TERM GOAL #5   Title Pt will complete self feeding with setup, use of universal cuff and minimal spillage.    Baseline inconsistent with feeding and increased spillage.    Time 12    Period Weeks    Status New    Target Date 12/10/19      Long Term Additional Goals   Additional Long Term Goals Yes      OT LONG TERM GOAL #6   Title Patient will increase right UE ROM to comb the back of her hair with modified independence.    Baseline  can comb the sides but not the back    Time 12    Period Weeks    Status New    Target Date 12/10/19                 Plan - 11/08/19 0939    Clinical Impression Statement Pt. continues to improve with, and tolerate ROM, and Pt. tolerated ROM well without reports of pain, or discomfort.  Pt. Was able to maintain hold of the circular sphere between her hands while performing ROM. Pt. continues to benefit from working on improving ROM in order to work towards increasing engagement of her bilateral hands during ADLs, and IADL tasks.    OT Occupational Profile and History Detailed Assessment- Review of Records and additional review of physical, cognitive, psychosocial history related to current functional performance    Occupational performance deficits (Please refer to evaluation for details): ADL's;IADL's;Leisure;Social Participation    Body Structure / Function / Physical Skills ADL;Continence;Dexterity;Flexibility;Strength;ROM;Balance;Coordination;FMC;IADL;Endurance;UE functional use;Decreased knowledge of use of DME;GMC    Psychosocial Skills Environmental  Adaptations;Habits;Routines and Behaviors    Rehab Potential Fair    Clinical Decision Making Limited treatment options, no task modification necessary    Comorbidities Affecting Occupational Performance: Presence of comorbidities impacting occupational performance    Modification or Assistance to Complete Evaluation  Min-Moderate modification of tasks or assist with assess necessary to complete eval    OT Frequency 2x / week    OT Duration 12 weeks    OT Treatment/Interventions Self-care/ADL training;Cryotherapy;Therapeutic exercise;DME and/or AE instruction;Balance training;Neuromuscular education;Manual Therapy;Splinting;Moist Heat;Passive range of motion;Therapeutic activities;Patient/family education    Consulted and Agree with Plan of Care Patient           Patient will benefit from skilled therapeutic intervention in order  to improve the following deficits and impairments:   Body Structure / Function / Physical Skills: ADL, Continence, Dexterity, Flexibility, Strength, ROM, Balance, Coordination, FMC, IADL, Endurance, UE functional use, Decreased knowledge of use of DME, GMC   Psychosocial Skills: Environmental  Adaptations, Habits, Routines and Behaviors   Visit Diagnosis: Muscle weakness (generalized)    Problem List Patient Active Problem List   Diagnosis Date Noted  . Acute appendicitis with appendiceal abscess 05/30/2018  . Hypocalcemia 02/15/2018  . Dysuria 02/15/2018  . Bilateral lower extremity edema 02/11/2018  . Vaginal yeast infection 01/30/2018  . Chest tightness 01/27/2018  . Healthcare-associated pneumonia 01/25/2018  . Pleural effusion, not elsewhere classified  01/25/2018  . Acute deep vein thrombosis (DVT) of left lower extremity ([place]) 01/24/2018  . Acute deep vein thrombosis (DVT) of right lower extremity ([place]) 01/24/2018  . Chronic allergic rhinitis 01/24/2018  . Depression with anxiety 01/24/2018  . UTI due to Klebsiella species 01/24/2018  . Protein-calorie malnutrition, severe ([place]) 01/24/2018  . S/P insertion of IVC (inferior vena caval) filter 01/24/2018  . Reactive depression   . Hypokalemia   . Leukocytosis   . Essential hypertension   . Trauma   . 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([place])   . Neuropathic pain   . Neurogenic bowel   . Neurogenic bladder   . Benign essential HTN   . Acute blood loss anemia   . Central cord syndrome at C6 level of cervical spinal cord ([place]) 11/29/2017  . Central cord syndrome [facility]) 11/29/2017    Harrel Carina, MS, OTR/L 11/08/2019, 9:40 AM  [place] MAIN [facility] SERVICES [address] [place], [place], [address] Phone: [phone]   Fax:  [phone]  Name: KERRY-ANNE MEZO MRN: [id number] Date of Birth: 08-01-1934

## 2019-11-12 ENCOUNTER — Other Ambulatory Visit: Payer: Self-pay

## 2019-11-12 ENCOUNTER — Ambulatory Visit: Payer: Medicare HMO

## 2019-11-12 DIAGNOSIS — R278 Other lack of coordination: Secondary | ICD-10-CM

## 2019-11-12 DIAGNOSIS — M6281 Muscle weakness (generalized): Secondary | ICD-10-CM

## 2019-11-12 NOTE — Therapy (Signed)
[place] MAIN [facility] SERVICES [address] [place], [place], [address] Phone: [phone]   Fax:  [phone]  Physical Therapy Treatment  Patient Details  Name: Sherry [name] MRN: [id number] Date of Birth: 1934-10-22 Referring Provider (PT): Viviana Simpler   Encounter Date: 11/12/2019   PT End of Session - 11/12/19 1447    Visit Number 16    Number of Visits 25    Date for PT Re-Evaluation 01/14/20    Authorization Type [employer]na medicare FOTO performed by PT on eval (7/20), score 12    PT Start Time 1432    PT Stop Time 1515    PT Time Calculation (min) 43 min    Activity Tolerance Patient tolerated treatment well;No increased pain;Patient limited by fatigue    Behavior During Therapy [facility] for tasks assessed/performed           Past Medical History:  Diagnosis Date   Acute blood loss anemia    Arthritis    Cancer ([place])    skin   Central cord syndrome at C6 level of cervical spinal cord ([place]) 11/29/2017   Hypertension    Protein-calorie malnutrition, severe ([place]) 01/24/2018   S/P insertion of IVC (inferior vena caval) filter 01/24/2018   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([place])     Past Surgical History:  Procedure Laterality Date   ANTERIOR CERVICAL DECOMP/DISCECTOMY FUSION N/A 11/29/2017   Procedure: Cervical five-six, six-seven Anterior Cervical Decompression Fusion;  Surgeon: Judith Part, MD;  Location: [facility];  Service: Neurosurgery;  Laterality: N/A;  Cervical five-six, six-seven Anterior Cervical Decompression Fusion   CATARACT EXTRACTION     IR IVC FILTER PLMT / S&I /IMG GUID/MOD SED  12/08/2017    There were no vitals filed for this visit.   Subjective Assessment - 11/12/19 1442    Subjective Pt reports doing fine today. No pain reported upon arrival. No specific questions/concerns. No excessive soreness following PT sessions.    Patient is accompained by: --    Pertinent History Pt is an 85 yo female that fell in  2019, fractured vertebrae in her neck and in her low back per family. 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 After hospital stay, pt discharged to [facility] for ~1 month, experienced severe UTI as well as bilateral DVT (IVC filter placed). Discharged to [facility], stayed for about a year, and then moved to [facility] in April 2021. Was receiving PT, but per family facility reported that did not have adequate equipment to maximize PT for her. Pt until about 1 month ago was practicing sit to stand transfers with 1-2 people, and working on static standing. Has a brace for L foot due to PF. Pt currently needs assistance with all ADLs (able to assist with donning/doffing her shirt), bed baths, and needs a hoyer lift for transfers. Able to drive power wheelchair without assistance.    Currently in Pain? No/denies             TREATMENT   Ther-ex Hoyer lift from power wc <>mat; Supine hip abductionx 10BLE, with manual resistance;  Supine hip adductionx 10BLE, with manual resistance; Supine SLR with assistx 10 BLE; Supine quad and glut sets 3s hold x 10 BLE; Hooklying heel slides with resisted extensionx 10 bilateral lower extremity; Hooklying marchingx 10BLE, assist required forBLE; Hooklying SAQ over bolster with light manual resistance for LLE, assist for RLE to get to end range extension x 10BLE, muscle tapping at R quad to encourage improved muscle activation;  Hooklying bridges over bolster under knees3s holdx10; Penguins (oblique lateral crunch) x 10 each side; Partial crunches with assist from therapist 2-3s hold x 10;   Therapeutic Activity Rolling left and right with mod assist x 1and therapist providing cues for proper sequencing;    Pt educated throughout session about proper posture and technique with exercises. Improved exercise technique, movement at target joints, use of target muscles after min to mod verbal, visual, tactile cues.   Asked  patient if she would like to perform standing or strengthening exercises today and patient states that she would prefer to perform strength exercises today and attempt standing later this week.  She is able to complete all exercises as instructed today.  Also continuedto work on [place] with rolling left and right and patient is demonstrating improved initiation and core strength during rolling. Will plan on practicing standing with the litegait next session.Patient encouraged to continue her HEP and follow-up as scheduled. She will continue to benefit from additional skilled physical therapy services to continue address deficits in strength, balance, and functional mobility.                           PT Short Term Goals - 10/22/19 1313      PT SHORT TERM GOAL #1   Title The patient will perform initial HEP with minimum assistance in order to improve strength and function.    Time 4    Period Weeks    Status On-going    Target Date 12/03/19             PT Long Term Goals - 10/22/19 1313      PT LONG TERM GOAL #1   Title The patient will be compliant with finalized HEP with minimum assistance in preparation for self management and maintenance of condition.    Time 12    Period Weeks    Status On-going    Target Date 01/14/20      PT LONG TERM GOAL #2   Title The patient will demonstrate at least 10 point improvement on FOTO score indicating an improved ability to perform functional activities.    Baseline on eval (/720) score 12; 10/22/19: 12    Time 12    Period Weeks    Status On-going    Target Date 01/14/20      PT LONG TERM GOAL #3   Title The patient will demonstrate lateral scooting  for assistance with transfers with minA to maximize independence.    Baseline 10/22/19: Pt requires modA+1 for lateral scooting    Time 8    Period Weeks    Status On-going    Target Date 01/14/20      PT LONG TERM GOAL #4   Title The patient will demonstrate  a squat pivot transfer with minimum assistance to maximize independence and mobility.    Baseline 10/22/19: unable to safely attempt at this time    Time 12    Period Weeks    Status On-going    Target Date 01/14/20      PT LONG TERM GOAL #5   Title The patient will demonstrate sitting without UE support for 2-5 minutes at EOB to improve participation and maximize independence with ADLs.    Baseline 10/22/19: Pt able ot sit unsupported for at least 2 minutes at edge of bed    Time 12    Period Weeks    Status Achieved  Target Date --                 Plan - 11/12/19 1726    Clinical Impression Statement Asked patient if she would like to perform standing or strengthening exercises today and patient states that she would prefer to perform strength exercises today and attempt standing later this week.  She is able to complete all exercises as instructed today.  Also continued to work on [place] with rolling left and right and patient is demonstrating improved initiation and core strength during rolling. Will plan on practicing standing with the lite gait next session. Patient encouraged to continue her HEP and follow-up as scheduled.  She will continue to benefit from additional skilled physical therapy services to continue address deficits in strength, balance, and functional mobility.    Personal Factors and Comorbidities Age;Time since onset of injury/illness/exacerbation;Comorbidity 3+;Fitness    Comorbidities HTN, quadriparesis, history of DVT, neurogenic bladder    Examination-Activity Limitations Bathing;Hygiene/Grooming;Squat;Bed Mobility;Lift;Bend;Stand;Engineer, manufacturing;Toileting;Self Feeding;Transfers;Continence;Sit;Dressing;Sleep;Carry    Examination-Participation Restrictions Church;Laundry;Cleaning;Medication Management;Community Activity;Meal Prep;Interpersonal Relationship    Stability/Clinical Decision Making Evolving/Moderate complexity    Rehab  Potential Fair    PT Frequency 2x / week    PT Duration 12 weeks    PT Treatment/Interventions ADLs/Self Care Home Management;Electrical Stimulation;Therapeutic activities;Wheelchair mobility training;Vasopneumatic Device;Joint Manipulations;Vestibular;Passive range of motion;Patient/family education;Therapeutic exercise;DME Instruction;Biofeedback;Aquatic Therapy;Moist Heat;Gait training;Balance training;Orthotic Fit/Training;Dry needling;Energy conservation;Taping;Splinting;Neuromuscular re-education;Cryotherapy;Ultrasound;Functional mobility training    PT Next Visit Plan platform attachments for RW to assess standing? transfers, sitting balance/endurance    PT Home Exercise Plan sitting balance unsupported hold 30-45sec with aide    Consulted and Agree with Plan of Care Patient    Family Member Consulted Aide gladys           Patient will benefit from skilled therapeutic intervention in order to improve the following deficits and impairments:  Abnormal gait, Decreased balance, Decreased endurance, Decreased mobility, Difficulty walking, Hypomobility, Impaired sensation, Decreased range of motion, Improper body mechanics, Impaired perceived functional ability, Decreased activity tolerance, Decreased knowledge of use of DME, Decreased safety awareness, Decreased strength, Impaired flexibility, Impaired UE functional use, Postural dysfunction  Visit Diagnosis: Muscle weakness (generalized)  Other lack of coordination     Problem List Patient Active Problem List   Diagnosis Date Noted   Acute appendicitis with appendiceal abscess 05/30/2018   Hypocalcemia 02/15/2018   Dysuria 02/15/2018   Bilateral lower extremity edema 02/11/2018   Vaginal yeast infection 01/30/2018   Chest tightness 01/27/2018   Healthcare-associated pneumonia 01/25/2018   Pleural effusion, not elsewhere classified 01/25/2018   Acute deep vein thrombosis (DVT) of left lower extremity ([place]) 01/24/2018    Acute deep vein thrombosis (DVT) of right lower extremity ([place]) 01/24/2018   Chronic allergic rhinitis 01/24/2018   Depression with anxiety 01/24/2018   UTI due to Klebsiella species 01/24/2018   Protein-calorie malnutrition, severe ([place]) 01/24/2018   S/P insertion of IVC (inferior vena caval) filter 01/24/2018   Reactive depression    Hypokalemia    Leukocytosis    Essential hypertension    Trauma    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([facility])    Neuropathic pain    Neurogenic bowel    Neurogenic bladder    Benign essential HTN    Acute blood loss anemia    Central cord syndrome at C6 level of cervical spinal cord ([place]) 11/29/2017   Central cord syndrome ([place]) 11/29/2017   Lyndel Safe Sailor Haughn PT, DPT, GCS  Lillianna Sabel 11/12/2019, 5:32 PM  [place] MAIN  [place], [place], [address] Phone: [phone]   Fax:  [phone]  Name: Sherry [name] MRN: [id number] Date of Birth: 1934-09-07

## 2019-11-14 ENCOUNTER — Other Ambulatory Visit: Payer: Self-pay

## 2019-11-14 ENCOUNTER — Encounter: Payer: Self-pay | Admitting: Occupational Therapy

## 2019-11-14 ENCOUNTER — Ambulatory Visit: Payer: Medicare HMO | Admitting: Occupational Therapy

## 2019-11-14 DIAGNOSIS — R278 Other lack of coordination: Secondary | ICD-10-CM

## 2019-11-14 DIAGNOSIS — M6281 Muscle weakness (generalized): Secondary | ICD-10-CM

## 2019-11-14 NOTE — Therapy (Signed)
[place] MAIN [facility] SERVICES [address] [place], [place], [address] Phone: [phone]   Fax:  [phone]  Occupational Therapy Treatment/Progress Update Reporting period from 09/17/2019 to 11/14/2019  Patient Details  Name: Sherry [name] MRN: [id number] Date of Birth: [birth year] No data recorded  Encounter Date: 11/14/2019   OT End of Session - 11/14/19 1553    Visit Number 10    Number of Visits 24    Date for OT Re-Evaluation 12/10/19    Authorization Type --    Authorization Time Period Progress report period starting 09/17/19    OT Start Time 1258    OT Stop Time 1345    OT Time Calculation (min) 47 min    Equipment Utilized During Treatment moist heat    Activity Tolerance Patient tolerated treatment well    Behavior During Therapy [facility] for tasks assessed/performed           Past Medical History:  Diagnosis Date   Acute blood loss anemia    Arthritis    Cancer ([place])    skin   Central cord syndrome at C6 level of cervical spinal cord ([place]) 11/29/2017   Hypertension    Protein-calorie malnutrition, severe ([place]) 01/24/2018   S/P insertion of IVC (inferior vena caval) filter 01/24/2018   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([place])     Past Surgical History:  Procedure Laterality Date   ANTERIOR CERVICAL DECOMP/DISCECTOMY FUSION N/A 11/29/2017   Procedure: Cervical five-six, six-seven Anterior Cervical Decompression Fusion;  Surgeon: Judith Part, MD;  Location: [place];  Service: Neurosurgery;  Laterality: N/A;  Cervical five-six, six-seven Anterior Cervical Decompression Fusion   CATARACT EXTRACTION     IR IVC FILTER PLMT / S&I /IMG GUID/MOD SED  12/08/2017    There were no vitals filed for this visit.    OT Treatment:  Therapeutic Activities OT applies moist heat to pt shoulders in prep for completing ROM to improve elasticity of connective tissue and enhance pt comfort. OT engages pt in UE ROM with L shoulder to ~130  degrees with gentle prolonged PROM stretch to ~135 degrees. Pt tolerates well. Similar pattern completed on R side with pt completing shoulder flexion to ~100 degrees on R side and ~110 PROM with gentle prolonged stretch. Similar pattern completed with wrist and digits. Pt with very limited L hand digit mobility, tolerating flexion at the MCPs ~10-20 degrees (with moist heat promoting pt more comfortably tolerating increased stretch). Pt with slight c/o pain at 2/10 in R shoulder with stretching, but overall tolerates well and reports pain subsides with rest and heat.   Therapeutic Exercise OT facilitates pt participation in R shoulder horizontal abduction for 1 set x10 reps bilaterally, 1 set x10 reps abduction from 90-110 degrees on R side/90-120 on L side, forward shoulder flexion for 1 set x10 reps, 1 set x10 reps gentle cervical rotation and lateral flexion bilaterally. Pt requires ~1 minute rest break between each exercise. Tolerates well. No c/o pain with exercises. Shoulder exercises chosen as pt with goal to comb her own hair. Will continue to follow per OT POC and work towards strengthening as it pertains to improved indep with self care ADLs.                     OT Education - 11/14/19 1542    Education Details HEP    Person(s) Educated Patient;Caregiver(s)    Methods Explanation;Demonstration    Comprehension Verbalized understanding;Need further instruction  OT Long Term Goals - 11/14/19 1603      OT LONG TERM GOAL #1   Title Patient and caregiver will demonstrate understanding of home exercise program for ROM.    Baseline patient  with recollection of ~20% of HEP, reports that she has a rehab aide that will work on this with her at [facility]    Time 6    Period Weeks    Status On-going    Target Date 12/26/19      OT LONG TERM GOAL #2   Title Patient will demonstrate ability to don shirt with min assist from seated position.    Baseline max  A    Time 6    Period Weeks    Status On-going    Target Date 12/26/19      OT LONG TERM GOAL #3   Title Patient will demonstrate improved sitting balance at the edge of the bed to participate in self care tasks.    Baseline requires assist    Time 12    Period Weeks    Status On-going    Target Date 12/10/19      OT LONG TERM GOAL #4   Title Patient will demonstrate improved composite finger flexion to hold deodorant to apply to underarms.    Baseline pt with improving finger flexion of R hand to use partial cylindrical grasp, but unable to grasp and hold. Very limited digit flexion of L hand.    Time 12    Period Weeks    Status On-going    Target Date 12/10/19      OT LONG TERM GOAL #5   Title Pt will complete self feeding with setup, use of universal cuff and minimal spillage.    Baseline inconsistent with feeding and increased spillage.    Time 12    Period Weeks    Status New    Target Date 12/10/19      OT LONG TERM GOAL #6   Title Patient will increase right UE ROM to comb the back of her hair with modified independence.    Baseline can comb the sides but not the back    Time 12    Period Weeks    Status New    Target Date 12/10/19                 Plan - 11/14/19 1558    Clinical Impression Statement Pt continues to show improved tolerance for ROM and gentle stretching. Pt tolerates ROM of B UEs-shoulder, wrist, and digits well without c/o pain except 2/10 pain in R shoulder with gentle flexion stretch, but pt reports only temporary and dissipates quickly. Pt continues to benefit from addressing ROM to work towards increasing tolerance and efficiency in her ADL tasks.    OT Occupational Profile and History Detailed Assessment- Review of Records and additional review of physical, cognitive, psychosocial history related to current functional performance    Occupational performance deficits (Please refer to evaluation for details): ADL's;IADL's;Leisure;Social  Participation    Body Structure / Function / Physical Skills ADL;Continence;Dexterity;Flexibility;Strength;ROM;Balance;Coordination;FMC;IADL;Endurance;UE functional use;Decreased knowledge of use of DME;GMC    Psychosocial Skills Environmental  Adaptations;Habits;Routines and Behaviors    Rehab Potential Fair    Clinical Decision Making Limited treatment options, no task modification necessary    Comorbidities Affecting Occupational Performance: Presence of comorbidities impacting occupational performance    Comorbidities impacting occupational performance description: contractures of bilateral hands, dependent transfers,    Modification or Assistance to  Complete Evaluation  Min-Moderate modification of tasks or assist with assess necessary to complete eval    OT Frequency 2x / week    OT Duration 12 weeks    OT Treatment/Interventions Self-care/ADL training;Cryotherapy;Therapeutic exercise;DME and/or AE instruction;Balance training;Neuromuscular education;Manual Therapy;Splinting;Moist Heat;Passive range of motion;Therapeutic activities;Patient/family education    Plan continue to progress ROM of digits, wrists and shoulders as it pertains to completion of ADL tasks that pt values.    Consulted and Agree with Plan of Care Patient           Patient will benefit from skilled therapeutic intervention in order to improve the following deficits and impairments:   Body Structure / Function / Physical Skills: ADL, Continence, Dexterity, Flexibility, Strength, ROM, Balance, Coordination, FMC, IADL, Endurance, UE functional use, Decreased knowledge of use of DME, GMC   Psychosocial Skills: Environmental  Adaptations, Habits, Routines and Behaviors   Visit Diagnosis: Muscle weakness (generalized)  Other lack of coordination    Problem List Patient Active Problem List   Diagnosis Date Noted   Acute appendicitis with appendiceal abscess 05/30/2018   Hypocalcemia 02/15/2018   Dysuria  02/15/2018   Bilateral lower extremity edema 02/11/2018   Vaginal yeast infection 01/30/2018   Chest tightness 01/27/2018   Healthcare-associated pneumonia 01/25/2018   Pleural effusion, not elsewhere classified 01/25/2018   Acute deep vein thrombosis (DVT) of left lower extremity ([place]) 01/24/2018   Acute deep vein thrombosis (DVT) of right lower extremity ([place]) 01/24/2018   Chronic allergic rhinitis 01/24/2018   Depression with anxiety 01/24/2018   UTI due to Klebsiella species 01/24/2018   Protein-calorie malnutrition, severe ([place]) 01/24/2018   S/P insertion of IVC (inferior vena caval) filter 01/24/2018   Reactive depression    Hypokalemia    Leukocytosis    Essential hypertension    Trauma    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([facility])    Neuropathic pain    Neurogenic bowel    Neurogenic bladder    Benign essential HTN    Acute blood loss anemia    Central cord syndrome at C6 level of cervical spinal cord ([place]) 11/29/2017   Central cord syndrome ([place]) 11/29/2017    Sharren Bridge 11/14/2019, 4:28 PM  [place] MAIN [facility] SERVICES [address] [place], [place], [address] Phone: [phone]   Fax:  [phone]  Name: Sherry [name] MRN: [id number] Date of Birth: [birth year]

## 2019-11-19 ENCOUNTER — Other Ambulatory Visit: Payer: Self-pay

## 2019-11-19 ENCOUNTER — Ambulatory Visit: Payer: Medicare HMO | Admitting: Occupational Therapy

## 2019-11-19 ENCOUNTER — Encounter: Payer: Self-pay | Admitting: Occupational Therapy

## 2019-11-19 DIAGNOSIS — M6281 Muscle weakness (generalized): Secondary | ICD-10-CM | POA: Diagnosis not present

## 2019-11-19 DIAGNOSIS — R278 Other lack of coordination: Secondary | ICD-10-CM

## 2019-11-19 NOTE — Therapy (Addendum)
[place] MAIN [facility] SERVICES [address] [place], [place], [address] Phone: [phone]   Fax:  [phone]  Occupational Therapy Treatment  Patient Details  Name: Sherry [name] MRN: [id number] Date of Birth: 1934-02-22 No data recorded  Encounter Date: 11/19/2019   OT End of Session - 11/19/19 1422    Visit Number 11    Number of Visits 24    Date for OT Re-Evaluation 12/10/19    Authorization Time Period Progress report period starting 09/17/19    OT Start Time 1106    OT Stop Time 1149    OT Time Calculation (min) 43 min    Equipment Utilized During Treatment moist heat    Activity Tolerance Patient tolerated treatment well    Behavior During Therapy [facility] for tasks assessed/performed           Past Medical History:  Diagnosis Date  . Acute blood loss anemia   . Arthritis   . Cancer ([place])    skin  . Central cord syndrome at C6 level of cervical spinal cord ([place]) 11/29/2017  . Hypertension   . Protein-calorie malnutrition, severe ([place]) 01/24/2018  . S/P insertion of IVC (inferior vena caval) filter 01/24/2018  . 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[facility])     Past Surgical History:  Procedure Laterality Date  . ANTERIOR CERVICAL DECOMP/DISCECTOMY FUSION N/A 11/29/2017   Procedure: Cervical five-six, six-seven Anterior Cervical Decompression Fusion;  Surgeon: Judith Part, MD;  Location: [place];  Service: Neurosurgery;  Laterality: N/A;  Cervical five-six, six-seven Anterior Cervical Decompression Fusion  . CATARACT EXTRACTION    . IR IVC FILTER PLMT / S&I /IMG GUID/MOD SED  12/08/2017    There were no vitals filed for this visit.   Subjective Assessment - 11/19/19 1415    Subjective  Pt reports looking forward to therapy and people watching afterwards.    Patient is accompanied by: --   caregiver   Pertinent History Patient s/p fall November 29, 2017 resulting in diagnosis of central cord syndrome at C 6 level.  She has had therapy in  multiple venues but with recent move to [facility], therapy staff recommended she seek outpatient therapy for her needs.    Patient Stated Goals Patient would like to be able to move better in bed, stand and perform self care tasks.    Currently in Pain? Yes    Pain Score 1     Pain Location Shoulder    Pain Orientation Left;Right    Pain Descriptors / Indicators Aching    Pain Type Chronic pain                 OT Treatments/Exercises (OP) - 11/19/19 0001      Modalities   Modalities Moist Heat      Moist Heat Therapy   Number Minutes Moist Heat 10 Minutes    Moist Heat Location Wrist;Shoulder           Therapeutic Exercise Pt tolerated moist heat to shoulders and B wrists in prep for completing ROM to improve elasticity of connective tissue and enhance pt comfort. Pt completed 1 set x 15 reps each B shoulder elevation, horizontal ab/adduction, and shoulder forward flexion c elbow flexion/extension (L AROM > R - pt able to reach back of head c LUE; reaches nose/mouth c RUE). Pt completed 1 set x 15 reps AAROM shoulder forward flexion. Pt tolerated 1 set x 10 reps each B wrist extension and radial deviation. Pt with very  limited L hand wrist extension and digit mobility. Will continue to follow per OT POC and work towards strengthening as it pertains to improved indep with self care ADLs.  Therapeutic Activity Pt worked on [facility] skills grasping small scrabble tiles from container. Pt utilized bimanual integration and lateral pinch of R hand to remove tiles 1 at a time and place them on the table to spell words. Pt required intermittent assist to stabilize container. Pt worked on grasping tiles from tabletop surface to turn over, pt is ~50% successful utilizing pinch between bilateral 2nd digits. Pt utilized LUE to slide x21 tiles in groups of 3 along table and into R palm and returned tiles to container c VCs to stabilize container using L hand.   Self Care Pt focused on improving  [facility] with manipulating medication bottles. Pt reported increased difficulty opening twist top bottles this date, requires increased time and VCs for hand control - reported fatigue following prolonged wrist exercises this date. Pt opened pop cap bottle Independently with L hand as stabilizer against chest and R hand to twist open. Pt opened tupperware container Independently using L hand to stabilize and R hand to open lid.        OT Education - 11/19/19 1421    Education Details HEP    Person(s) Educated Patient;Caregiver(s)    Methods Explanation;Demonstration    Comprehension Verbalized understanding;Need further instruction               OT Long Term Goals - 11/14/19 1603      OT LONG TERM GOAL #1   Title Patient and caregiver will demonstrate understanding of home exercise program for ROM.    Baseline patient  with recollection of ~20% of HEP, reports that she has a rehab aide that will work on this with her at [facility]    Time 6    Period Weeks    Status On-going    Target Date 12/26/19      OT LONG TERM GOAL #2   Title Patient will demonstrate ability to don shirt with min assist from seated position.    Baseline max A    Time 6    Period Weeks    Status On-going    Target Date 12/26/19      OT LONG TERM GOAL #3   Title Patient will demonstrate improved sitting balance at the edge of the bed to participate in self care tasks.    Baseline requires assist    Time 12    Period Weeks    Status On-going    Target Date 12/10/19      OT LONG TERM GOAL #4   Title Patient will demonstrate improved composite finger flexion to hold deodorant to apply to underarms.    Baseline pt with improving finger flexion of R hand to use partial cylindrical grasp, but unable to grasp and hold. Very limited digit flexion of L hand.    Time 12    Period Weeks    Status On-going    Target Date 12/10/19      OT LONG TERM GOAL #5   Title Pt will complete self feeding with setup, use  of universal cuff and minimal spillage.    Baseline inconsistent with feeding and increased spillage.    Time 12    Period Weeks    Status New    Target Date 12/10/19      OT LONG TERM GOAL #6   Title Patient will increase  right UE ROM to comb the back of her hair with modified independence.    Baseline can comb the sides but not the back    Time 12    Period Weeks    Status New    Target Date 12/10/19                 Plan - 11/19/19 1608    Clinical Impression Statement Pt continues to show improved tolerance for AROM to B shoulder and wrist.  Pt. continues to work on normalizing tone, improving BUE ROM, and strength in order to work towards increasing BUE engagement during ADLs, and IADL tasks.    OT Occupational Profile and History Detailed Assessment- Review of Records and additional review of physical, cognitive, psychosocial history related to current functional performance    Occupational performance deficits (Please refer to evaluation for details): ADL's;IADL's;Leisure;Social Participation    Body Structure / Function / Physical Skills ADL;Continence;Dexterity;Flexibility;Strength;ROM;Balance;Coordination;FMC;IADL;Endurance;UE functional use;Decreased knowledge of use of DME;GMC    Psychosocial Skills Environmental  Adaptations;Habits;Routines and Behaviors    Rehab Potential Fair    Clinical Decision Making Limited treatment options, no task modification necessary    Comorbidities Affecting Occupational Performance: Presence of comorbidities impacting occupational performance    Comorbidities impacting occupational performance description: contractures of bilateral hands, dependent transfers,    Modification or Assistance to Complete Evaluation  Min-Moderate modification of tasks or assist with assess necessary to complete eval    OT Frequency 2x / week    OT Duration 12 weeks    OT Treatment/Interventions Self-care/ADL training;Cryotherapy;Therapeutic exercise;DME  and/or AE instruction;Balance training;Neuromuscular education;Manual Therapy;Splinting;Moist Heat;Passive range of motion;Therapeutic activities;Patient/family education    Plan continue to progress ROM of digits, wrists and shoulders as it pertains to completion of ADL tasks that pt values.    Consulted and Agree with Plan of Care Patient           Patient will benefit from skilled therapeutic intervention in order to improve the following deficits and impairments:   Body Structure / Function / Physical Skills: ADL, Continence, Dexterity, Flexibility, Strength, ROM, Balance, Coordination, FMC, IADL, Endurance, UE functional use, Decreased knowledge of use of DME, GMC   Psychosocial Skills: Environmental  Adaptations, Habits, Routines and Behaviors   Visit Diagnosis: Muscle weakness (generalized)  Other lack of coordination    Problem List Patient Active Problem List   Diagnosis Date Noted  . Acute appendicitis with appendiceal abscess 05/30/2018  . Hypocalcemia 02/15/2018  . Dysuria 02/15/2018  . Bilateral lower extremity edema 02/11/2018  . Vaginal yeast infection 01/30/2018  . Chest tightness 01/27/2018  . Healthcare-associated pneumonia 01/25/2018  . Pleural effusion, not elsewhere classified 01/25/2018  . Acute deep vein thrombosis (DVT) of left lower extremity ([place]) 01/24/2018  . Acute deep vein thrombosis (DVT) of right lower extremity ([place]) 01/24/2018  . Chronic allergic rhinitis 01/24/2018  . Depression with anxiety 01/24/2018  . UTI due to Klebsiella species 01/24/2018  . Protein-calorie malnutrition, severe ([place]) 01/24/2018  . S/P insertion of IVC (inferior vena caval) filter 01/24/2018  . Reactive depression   . Hypokalemia   . Leukocytosis   . Essential hypertension   . Trauma   . 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([place])   . Neuropathic pain   . Neurogenic bowel   . Neurogenic bladder   . Benign essential HTN   . Acute blood loss anemia   . Central cord syndrome at C6 level  of cervical spinal cord ([place]) 11/29/2017  . Central cord syndrome ([place]) 11/29/2017  Dessie Coma, M.S. OTR/L  11/19/19, 4:14 PM  ascom [phone]  [place] MAIN [facility] SERVICES [address] [place], [place], [address] Phone: [phone]   Fax:  [phone]  Name: NEENA BEECHAM MRN: [id number] Date of Birth: Feb 19, 1934

## 2019-11-21 ENCOUNTER — Other Ambulatory Visit: Payer: Self-pay

## 2019-11-21 ENCOUNTER — Ambulatory Visit: Payer: Medicare HMO

## 2019-11-21 DIAGNOSIS — M6281 Muscle weakness (generalized): Secondary | ICD-10-CM | POA: Diagnosis not present

## 2019-11-21 DIAGNOSIS — S14129S Central cord syndrome at unspecified level of cervical spinal cord, sequela: Secondary | ICD-10-CM

## 2019-11-21 NOTE — Therapy (Signed)
[place] MAIN [facility] SERVICES [address] [place], [place], [address] Phone: [phone]   Fax:  [phone]  Physical Therapy Treatment  Patient Details  Name: Sherry [name] MRN: [id number] Date of Birth: [birth year] Referring Provider (PT): Viviana Simpler   Encounter Date: 11/21/2019   PT End of Session - 11/21/19 1316    Visit Number 17    Number of Visits 25    Date for PT Re-Evaluation 01/14/20    Authorization Type aetna medicare FOTO performed by PT on eval (7/20), score 12    PT Start Time 2353    PT Stop Time 1345    PT Time Calculation (min) 40 min    Activity Tolerance Patient tolerated treatment well;No increased pain;Patient limited by fatigue    Behavior During Therapy [facility] for tasks assessed/performed           Past Medical History:  Diagnosis Date  . Acute blood loss anemia   . Arthritis   . Cancer ([place])    skin  . Central cord syndrome at C6 level of cervical spinal cord ([place]) 11/29/2017  . Hypertension   . Protein-calorie malnutrition, severe ([place]) 01/24/2018  . S/P insertion of IVC (inferior vena caval) filter 01/24/2018  . 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[facility])     Past Surgical History:  Procedure Laterality Date  . ANTERIOR CERVICAL DECOMP/DISCECTOMY FUSION N/A 11/29/2017   Procedure: Cervical five-six, six-seven Anterior Cervical Decompression Fusion;  Surgeon: Judith Part, MD;  Location: [place];  Service: Neurosurgery;  Laterality: N/A;  Cervical five-six, six-seven Anterior Cervical Decompression Fusion  . CATARACT EXTRACTION    . IR IVC FILTER PLMT / S&I /IMG GUID/MOD SED  12/08/2017    There were no vitals filed for this visit.   Subjective Assessment - 11/21/19 1316    Subjective Pt reports doing fine today. No pain reported upon arrival. No specific questions/concerns. No excessive soreness following PT sessions. She got her flu shot yesterday and was having some injection site tenderness but it feels  alright now.    Pertinent History Pt is an 85 yo female that fell in 2019, fractured vertebrae in her neck and in her low back per family. 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 After hospital stay, pt discharged to CIR for ~1 month, experienced severe UTI as well as bilateral DVT (IVC filter placed). Discharged to [facility], stayed for about a year, and then moved to [facility] in April 2021. Was receiving PT, but per family facility reported that did not have adequate equipment to maximize PT for her. Pt until about 1 month ago was practicing sit to stand transfers with 1-2 people, and working on static standing. Has a brace for L foot due to PF. Pt currently needs assistance with all ADLs (able to assist with donning/doffing her shirt), bed baths, and needs a hoyer lift for transfers. Able to drive power wheelchair without assistance.    Currently in Pain? No/denies              TREATMENT   Ther-ex Hoyer lift from power wc <>mat; Supine hip abductionx 10BLE, with manual resistance;  Supine hip adductionx 10BLE, with manual resistance; Supine SLR with assistx 10 BLE; Supine quad and glut sets 3s hold x 10 BLE; Hooklying heel slides with resisted extensionx 10 bilateral lower extremity; Hooklying marchingx 10BLE, assist required forBLE; Hooklying SAQ over bolster with light manual resistance for LLE, assist for RLE to get to end range extension x  10BLE, muscle tapping at R quad to encourage improved muscle activation;  Hooklying bridges over bolster under knees3s holdx10; Penguins (oblique lateral crunch) x 10 each side; Partial crunches with assist from therapist 2-3s hold x 10;   Therapeutic Activity Rolling left and right with mod assistx1and therapist providing cues for proper sequencing x 5 toward each side;   Pt educated throughout session about proper posture and technique with exercises. Improved exercise technique, movement at target  joints, use of target muscles after min to mod verbal, visual, tactile cues.   Asked patient if she would like to perform standing or strengthening exercises today and patient states that she would prefer to perform strength exercises today and attempt standing at another visit.  She is able to complete all exercises as instructed today.  Also continuedto work on [place] with rolling left and right and patient is demonstrating improved initiation and core strength during rolling.Patient encouraged to continue her HEP and follow-up as scheduled. She is demonstrating improved strength during mat table exercises.She will continue to benefit from additional skilled physical therapy services to continue address deficits in strength, balance, and functional mobility.                             PT Short Term Goals - 10/22/19 1313      PT SHORT TERM GOAL #1   Title The patient will perform initial HEP with minimum assistance in order to improve strength and function.    Time 4    Period Weeks    Status On-going    Target Date 12/03/19             PT Long Term Goals - 10/22/19 1313      PT LONG TERM GOAL #1   Title The patient will be compliant with finalized HEP with minimum assistance in preparation for self management and maintenance of condition.    Time 12    Period Weeks    Status On-going    Target Date 01/14/20      PT LONG TERM GOAL #2   Title The patient will demonstrate at least 10 point improvement on FOTO score indicating an improved ability to perform functional activities.    Baseline on eval (/720) score 12; 10/22/19: 12    Time 12    Period Weeks    Status On-going    Target Date 01/14/20      PT LONG TERM GOAL #3   Title The patient will demonstrate lateral scooting  for assistance with transfers with minA to maximize independence.    Baseline 10/22/19: Pt requires modA+1 for lateral scooting    Time 8    Period Weeks    Status  On-going    Target Date 01/14/20      PT LONG TERM GOAL #4   Title The patient will demonstrate a squat pivot transfer with minimum assistance to maximize independence and mobility.    Baseline 10/22/19: unable to safely attempt at this time    Time 12    Period Weeks    Status On-going    Target Date 01/14/20      PT LONG TERM GOAL #5   Title The patient will demonstrate sitting without UE support for 2-5 minutes at EOB to improve participation and maximize independence with ADLs.    Baseline 10/22/19: Pt able ot sit unsupported for at least 2 minutes at edge of bed    Time  12    Period Weeks    Status Achieved    Target Date --                 Plan - 11/21/19 1404    Clinical Impression Statement Asked patient if she would like to perform standing or strengthening exercises today and patient states that she would prefer to perform strength exercises today and attempt standing at another visit.  She is able to complete all exercises as instructed today.  Also continued to work on [place] with rolling left and right and patient is demonstrating improved initiation and core strength during rolling. Patient encouraged to continue her HEP and follow-up as scheduled. She is demonstrating improved strength during mat table exercises. She will continue to benefit from additional skilled physical therapy services to continue address deficits in strength, balance, and functional mobility.    Personal Factors and Comorbidities Age;Time since onset of injury/illness/exacerbation;Comorbidity 3+;Fitness    Comorbidities HTN, quadriparesis, history of DVT, neurogenic bladder    Examination-Activity Limitations Bathing;Hygiene/Grooming;Squat;Bed Mobility;Lift;Bend;Stand;Engineer, manufacturing;Toileting;Self Feeding;Transfers;Continence;Sit;Dressing;Sleep;Carry    Examination-Participation Restrictions Church;Laundry;Cleaning;Medication Management;Community Activity;Meal  Prep;Interpersonal Relationship    Stability/Clinical Decision Making Evolving/Moderate complexity    Rehab Potential Fair    PT Frequency 2x / week    PT Duration 12 weeks    PT Treatment/Interventions ADLs/Self Care Home Management;Electrical Stimulation;Therapeutic activities;Wheelchair mobility training;Vasopneumatic Device;Joint Manipulations;Vestibular;Passive range of motion;Patient/family education;Therapeutic exercise;DME Instruction;Biofeedback;Aquatic Therapy;Moist Heat;Gait training;Balance training;Orthotic Fit/Training;Dry needling;Energy conservation;Taping;Splinting;Neuromuscular re-education;Cryotherapy;Ultrasound;Functional mobility training    PT Next Visit Plan platform attachments for RW to assess standing? transfers, sitting balance/endurance    PT Home Exercise Plan sitting balance unsupported hold 30-45sec with aide    Consulted and Agree with Plan of Care Patient    Family Member [place]           Patient will benefit from skilled therapeutic intervention in order to improve the following deficits and impairments:  Abnormal gait, Decreased balance, Decreased endurance, Decreased mobility, Difficulty walking, Hypomobility, Impaired sensation, Decreased range of motion, Improper body mechanics, Impaired perceived functional ability, Decreased activity tolerance, Decreased knowledge of use of DME, Decreased safety awareness, Decreased strength, Impaired flexibility, Impaired UE functional use, Postural dysfunction  Visit Diagnosis: Muscle weakness (generalized)  Central cord syndrome, sequela ([place])     Problem List Patient Active Problem List   Diagnosis Date Noted  . Acute appendicitis with appendiceal abscess 05/30/2018  . Hypocalcemia 02/15/2018  . Dysuria 02/15/2018  . Bilateral lower extremity edema 02/11/2018  . Vaginal yeast infection 01/30/2018  . Chest tightness 01/27/2018  . Healthcare-associated pneumonia 01/25/2018  . Pleural  effusion, not elsewhere classified 01/25/2018  . Acute deep vein thrombosis (DVT) of left lower extremity ([place]) 01/24/2018  . Acute deep vein thrombosis (DVT) of right lower extremity ([place]) 01/24/2018  . Chronic allergic rhinitis 01/24/2018  . Depression with anxiety 01/24/2018  . UTI due to Klebsiella species 01/24/2018  . Protein-calorie malnutrition, severe ([place]) 01/24/2018  . S/P insertion of IVC (inferior vena caval) filter 01/24/2018  . Reactive depression   . Hypokalemia   . Leukocytosis   . Essential hypertension   . Trauma   . 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([place])   . Neuropathic pain   . Neurogenic bowel   . Neurogenic bladder   . Benign essential HTN   . Acute blood loss anemia   . Central cord syndrome at C6 level of cervical spinal cord ([place]) 11/29/2017  . Central cord syndrome ([place]) 11/29/2017   Sherry [name] PT, DPT, GCS  Sherry [name] 11/21/2019, 2:07 PM  [place] MAIN [facility] SERVICES [address] [place], [place], [address] Phone: [phone]   Fax:  [phone]  Name: Sherry [name] MRN: [id number] Date of Birth: 1934/09/08

## 2019-11-27 ENCOUNTER — Other Ambulatory Visit: Payer: Self-pay

## 2019-11-27 ENCOUNTER — Encounter: Payer: Self-pay | Admitting: Occupational Therapy

## 2019-11-27 ENCOUNTER — Ambulatory Visit: Payer: Medicare HMO | Admitting: Occupational Therapy

## 2019-11-27 DIAGNOSIS — M6281 Muscle weakness (generalized): Secondary | ICD-10-CM

## 2019-11-27 DIAGNOSIS — R278 Other lack of coordination: Secondary | ICD-10-CM

## 2019-11-27 NOTE — Therapy (Signed)
[place] MAIN [facility] SERVICES [address] [place], [place], [address] Phone: [phone]   Fax:  [phone]  Occupational Therapy Treatment  Patient Details  Name: Sherry [name] MRN: [id number] Date of Birth: 1934-05-14 No data recorded  Encounter Date: 11/27/2019   OT End of Session - 11/27/19 1402    Visit Number 12    Number of Visits 24    Date for OT Re-Evaluation 12/10/19    Authorization Time Period Progress report period starting 09/17/19    OT Start Time 53    OT Stop Time 1345    OT Time Calculation (min) 45 min    Equipment Utilized During Treatment moist heat    Activity Tolerance Patient tolerated treatment well    Behavior During Therapy [facility] for tasks assessed/performed           Past Medical History:  Diagnosis Date   Acute blood loss anemia    Arthritis    Cancer ([place])    skin   Central cord syndrome at C6 level of cervical spinal cord ([place]) 11/29/2017   Hypertension    Protein-calorie malnutrition, severe ([place]) 01/24/2018   S/P insertion of IVC (inferior vena caval) filter 01/24/2018   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([place])     Past Surgical History:  Procedure Laterality Date   ANTERIOR CERVICAL DECOMP/DISCECTOMY FUSION N/A 11/29/2017   Procedure: Cervical five-six, six-seven Anterior Cervical Decompression Fusion;  Surgeon: Judith Part, MD;  Location: [place];  Service: Neurosurgery;  Laterality: N/A;  Cervical five-six, six-seven Anterior Cervical Decompression Fusion   CATARACT EXTRACTION     IR IVC FILTER PLMT / S&I /IMG GUID/MOD SED  12/08/2017    OT Treatment  Pt. tolerated bilateral shoulder flexion, extension, abduction, elbow flexion, extension forearm supination AAROM/AROM. PROM bilateral wrist flexion, and extension.Pt. worked on bilateral simultaneous AROM using a medium sized circular sphere for bilateral shoulder flexion, abduction, diagonal, and circular motion.No reports of pain or  discomfort with ROM.  Self-care:   Pt. worked on opening lids on various tubular, and tupperware containers. Pt. worked on grasping flat coins off of an elevated surface using the lateral aspect of her 2nd digit with her thumb, and worked on placing it into a container placed at a vertical angle.  Pt. is making progress, and is engaging her hands during more tasks. Pt. was able to use her bilateral hands to open more containers.  Pt. continues to present with limited bilateral wrist, and digit flexion secondary to extensore tightness. Pt. continues to benefit from working on improving ROM in order to work towards increasing engagement of her bilateral hands during ADLs, and IADL tasks.                          OT Education - 11/27/19 1402    Education Details HEP    Methods Explanation;Demonstration    Comprehension Verbalized understanding;Need further instruction               OT Long Term Goals - 11/14/19 1603      OT LONG TERM GOAL #1   Title Patient and caregiver will demonstrate understanding of home exercise program for ROM.    Baseline patient  with recollection of ~20% of HEP, reports that she has a rehab aide that will work on this with her at [facility]    Time 6    Period Weeks    Status On-going    Target Date  12/26/19      OT LONG TERM GOAL #2   Title Patient will demonstrate ability to don shirt with min assist from seated position.    Baseline max A    Time 6    Period Weeks    Status On-going    Target Date 12/26/19      OT LONG TERM GOAL #3   Title Patient will demonstrate improved sitting balance at the edge of the bed to participate in self care tasks.    Baseline requires assist    Time 12    Period Weeks    Status On-going    Target Date 12/10/19      OT LONG TERM GOAL #4   Title Patient will demonstrate improved composite finger flexion to hold deodorant to apply to underarms.    Baseline pt with improving finger flexion  of R hand to use partial cylindrical grasp, but unable to grasp and hold. Very limited digit flexion of L hand.    Time 12    Period Weeks    Status On-going    Target Date 12/10/19      OT LONG TERM GOAL #5   Title Pt will complete self feeding with setup, use of universal cuff and minimal spillage.    Baseline inconsistent with feeding and increased spillage.    Time 12    Period Weeks    Status New    Target Date 12/10/19      OT LONG TERM GOAL #6   Title Patient will increase right UE ROM to comb the back of her hair with modified independence.    Baseline can comb the sides but not the back    Time 12    Period Weeks    Status New    Target Date 12/10/19                 Plan - 11/27/19 1403    Clinical Impression Statement Pt. is making progress, and is engaging her hands during more tasks. Pt. was able to use her bilateral hands to open more containers.  Pt. continues to present with limited bilateral wrist, and digit flexion secondary to extensore tightness. Pt. continues to benefit from working on improving ROM in order to work towards increasing engagement of her bilateral hands during ADLs, and IADL tasks.   OT Occupational Profile and History Detailed Assessment- Review of Records and additional review of physical, cognitive, psychosocial history related to current functional performance    Occupational performance deficits (Please refer to evaluation for details): ADL's;IADL's;Leisure;Social Participation    Body Structure / Function / Physical Skills ADL;Continence;Dexterity;Flexibility;Strength;ROM;Balance;Coordination;FMC;IADL;Endurance;UE functional use;Decreased knowledge of use of DME;GMC    Psychosocial Skills Environmental  Adaptations;Habits;Routines and Behaviors    Rehab Potential Fair    Clinical Decision Making Limited treatment options, no task modification necessary    Comorbidities Affecting Occupational Performance: Presence of comorbidities  impacting occupational performance    Comorbidities impacting occupational performance description: contractures of bilateral hands, dependent transfers,    Modification or Assistance to Complete Evaluation  Min-Moderate modification of tasks or assist with assess necessary to complete eval    OT Frequency 2x / week    OT Duration 12 weeks    OT Treatment/Interventions Self-care/ADL training;Cryotherapy;Therapeutic exercise;DME and/or AE instruction;Balance training;Neuromuscular education;Manual Therapy;Splinting;Moist Heat;Passive range of motion;Therapeutic activities;Patient/family education    Consulted and Agree with Plan of Care Patient           Patient will benefit from skilled therapeutic  intervention in order to improve the following deficits and impairments:   Body Structure / Function / Physical Skills: ADL, Continence, Dexterity, Flexibility, Strength, ROM, Balance, Coordination, FMC, IADL, Endurance, UE functional use, Decreased knowledge of use of DME, GMC   Psychosocial Skills: Environmental  Adaptations, Habits, Routines and Behaviors   Visit Diagnosis: Muscle weakness (generalized)  Other lack of coordination    Problem List Patient Active Problem List   Diagnosis Date Noted   Acute appendicitis with appendiceal abscess 05/30/2018   Hypocalcemia 02/15/2018   Dysuria 02/15/2018   Bilateral lower extremity edema 02/11/2018   Vaginal yeast infection 01/30/2018   Chest tightness 01/27/2018   Healthcare-associated pneumonia 01/25/2018   Pleural effusion, not elsewhere classified 01/25/2018   Acute deep vein thrombosis (DVT) of left lower extremity ([place]) 01/24/2018   Acute deep vein thrombosis (DVT) of right lower extremity ([facility]) 01/24/2018   Chronic allergic rhinitis 01/24/2018   Depression with anxiety 01/24/2018   UTI due to Klebsiella species 01/24/2018   Protein-calorie malnutrition, severe ([place]) 01/24/2018   S/P insertion of IVC (inferior  vena caval) filter 01/24/2018   Reactive depression    Hypokalemia    Leukocytosis    Essential hypertension    Trauma    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([facility])    Neuropathic pain    Neurogenic bowel    Neurogenic bladder    Benign essential HTN    Acute blood loss anemia    Central cord syndrome at C6 level of cervical spinal cord ([place]) 11/29/2017   Central cord syndrome ([place]) 11/29/2017    Harrel Carina, MS, OTR/L 11/27/2019, 2:05 PM  [place] MAIN [facility] SERVICES [address] [place], [place], [address] Phone: [phone]   Fax:  [phone]  Name: DORATHEA FAERBER MRN: [id number] Date of Birth: 01/25/1935

## 2019-11-28 ENCOUNTER — Other Ambulatory Visit: Payer: Self-pay

## 2019-11-28 ENCOUNTER — Ambulatory Visit: Payer: Medicare HMO

## 2019-11-28 DIAGNOSIS — R278 Other lack of coordination: Secondary | ICD-10-CM

## 2019-11-28 DIAGNOSIS — M6281 Muscle weakness (generalized): Secondary | ICD-10-CM | POA: Diagnosis not present

## 2019-11-28 NOTE — Therapy (Signed)
[place] MAIN [facility] SERVICES [address] [place], [place], [address] Phone: [phone]   Fax:  [phone]  Physical Therapy Treatment  Patient Details  Name: Sherry [name] MRN: [id number] Date of Birth: Jan 16, 1935 Referring Provider (PT): Viviana Simpler   Encounter Date: 11/28/2019   PT End of Session - 11/28/19 1303    Visit Number 18    Number of Visits 25    Date for PT Re-Evaluation 01/14/20    Authorization Type aetna medicare FOTO performed by PT on eval (7/20), score 12    PT Start Time 1301    PT Stop Time 1345    PT Time Calculation (min) 44 min    Activity Tolerance Patient tolerated treatment well;No increased pain;Patient limited by fatigue    Behavior During Therapy [facility] for tasks assessed/performed           Past Medical History:  Diagnosis Date  . Acute blood loss anemia   . Arthritis   . Cancer ([place])    skin  . Central cord syndrome at C6 level of cervical spinal cord ([place]) 11/29/2017  . Hypertension   . Protein-calorie malnutrition, severe ([place]) 01/24/2018  . S/P insertion of IVC (inferior vena caval) filter 01/24/2018  . 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[facility])     Past Surgical History:  Procedure Laterality Date  . ANTERIOR CERVICAL DECOMP/DISCECTOMY FUSION N/A 11/29/2017   Procedure: Cervical five-six, six-seven Anterior Cervical Decompression Fusion;  Surgeon: Judith Part, MD;  Location: [place];  Service: Neurosurgery;  Laterality: N/A;  Cervical five-six, six-seven Anterior Cervical Decompression Fusion  . CATARACT EXTRACTION    . IR IVC FILTER PLMT / S&I /IMG GUID/MOD SED  12/08/2017    There were no vitals filed for this visit.   Subjective Assessment - 11/28/19 1536    Subjective Pt reports doing fine today. No pain reported upon arrival. No changes in health/medications. No specific questions/concerns.    Pertinent History Pt is an 85 yo female that fell in 2019, fractured vertebrae in her neck and in her  low back per family. 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 After hospital stay, pt discharged to [facility] for ~1 month, experienced severe UTI as well as bilateral DVT (IVC filter placed). Discharged to [facility], stayed for about a year, and then moved to [facility] in April 2021. Was receiving PT, but per family facility reported that did not have adequate equipment to maximize PT for her. Pt until about 1 month ago was practicing sit to stand transfers with 1-2 people, and working on static standing. Has a brace for L foot due to PF. Pt currently needs assistance with all ADLs (able to assist with donning/doffing her shirt), bed baths, and needs a hoyer lift for transfers. Able to drive power wheelchair without assistance.    Currently in Pain? No/denies              TREATMENT   Ther-ex Hoyer lift from power wc <>mat; Supine hip abductionx 10BLE, with manual resistance;  Supine hip adductionx 10BLE, with manual resistance; Supine SLR with assistx 10 BLE; Supine quad and glut sets 3s hold x 10 BLE; Hooklying heel slides with resisted extensionx 10 bilateral lower extremity; Hooklying marchingx 10BLE, assist required forBLE; Hooklying SAQ over bolster with light manual resistance for LLE, assist for RLE to get to end range extension x 10BLE, muscle tapping at R quad to encourage improved muscle activation;  Hooklying bridges over bolster under [employer]; Penguins (  oblique lateral crunch) x 10 each side; Partial crunches with assist from therapist 2-3s hold x 10; Seated marches x 10 BLE; Seated LAQ x 10 BLE, assist for RLE to get to end range;   Therapeutic Activity Rolling left and right with mod assistx1and therapist providing cues for proper sequencing x 5 toward each side;   Pt educated throughout session about proper posture and technique with exercises. Improved exercise technique, movement at target joints, use of target muscles after  min to mod verbal, visual, tactile cues.   Asked patient if she would like to perform standing or strengthening exercises today and patient states that she would prefer to perform strength exercises again today.  She is able to complete all exercises as instructed today.  Also continuedto work on [place] with rolling left and right and patient is demonstrating improved initiation and core strength during rolling.Asked if she notices any improvement at [facility] however she has a air mattress there which makes rolling more difficult so she is unsure. Patient encouraged to continue her HEP and follow-up as scheduled. She is demonstrating improved strength during mat table exercises.She will continue to benefit from additional skilled physical therapy services to continue address deficits in strength, balance, and functional mobility.                              PT Short Term Goals - 10/22/19 1313      PT SHORT TERM GOAL #1   Title The patient will perform initial HEP with minimum assistance in order to improve strength and function.    Time 4    Period Weeks    Status On-going    Target Date 12/03/19             PT Long Term Goals - 10/22/19 1313      PT LONG TERM GOAL #1   Title The patient will be compliant with finalized HEP with minimum assistance in preparation for self management and maintenance of condition.    Time 12    Period Weeks    Status On-going    Target Date 01/14/20      PT LONG TERM GOAL #2   Title The patient will demonstrate at least 10 point improvement on FOTO score indicating an improved ability to perform functional activities.    Baseline on eval (/720) score 12; 10/22/19: 12    Time 12    Period Weeks    Status On-going    Target Date 01/14/20      PT LONG TERM GOAL #3   Title The patient will demonstrate lateral scooting  for assistance with transfers with minA to maximize independence.    Baseline 10/22/19: Pt  requires modA+1 for lateral scooting    Time 8    Period Weeks    Status On-going    Target Date 01/14/20      PT LONG TERM GOAL #4   Title The patient will demonstrate a squat pivot transfer with minimum assistance to maximize independence and mobility.    Baseline 10/22/19: unable to safely attempt at this time    Time 12    Period Weeks    Status On-going    Target Date 01/14/20      PT LONG TERM GOAL #5   Title The patient will demonstrate sitting without UE support for 2-5 minutes at EOB to improve participation and maximize independence with ADLs.    Baseline  10/22/19: Pt able ot sit unsupported for at least 2 minutes at edge of bed    Time 12    Period Weeks    Status Achieved    Target Date --                 Plan - 11/28/19 1304    Clinical Impression Statement Asked patient if she would like to perform standing or strengthening exercises today and patient states that she would prefer to perform strength exercises again today.  She is able to complete all exercises as instructed today.  Also continued to work on [place] with rolling left and right and patient is demonstrating improved initiation and core strength during rolling. Asked if she notices any improvement at [facility] however she has a air mattress there which makes rolling more difficult so she is unsure. Patient encouraged to continue her HEP and follow-up as scheduled. She is demonstrating improved strength during mat table exercises. She will continue to benefit from additional skilled physical therapy services to continue address deficits in strength, balance, and functional mobility    Personal Factors and Comorbidities Age;Time since onset of injury/illness/exacerbation;Comorbidity 3+;Fitness    Comorbidities HTN, quadriparesis, history of DVT, neurogenic bladder    Examination-Activity Limitations Bathing;Hygiene/Grooming;Squat;Bed Mobility;Lift;Bend;Stand;Engineer, manufacturing;Toileting;Self  Feeding;Transfers;Continence;Sit;Dressing;Sleep;Carry    Examination-Participation Restrictions Church;Laundry;Cleaning;Medication Management;Community Activity;Meal Prep;Interpersonal Relationship    Stability/Clinical Decision Making Evolving/Moderate complexity    Rehab Potential Fair    PT Frequency 2x / week    PT Duration 12 weeks    PT Treatment/Interventions ADLs/Self Care Home Management;Electrical Stimulation;Therapeutic activities;Wheelchair mobility training;Vasopneumatic Device;Joint Manipulations;Vestibular;Passive range of motion;Patient/family education;Therapeutic exercise;DME Instruction;Biofeedback;Aquatic Therapy;Moist Heat;Gait training;Balance training;Orthotic Fit/Training;Dry needling;Energy conservation;Taping;Splinting;Neuromuscular re-education;Cryotherapy;Ultrasound;Functional mobility training    PT Next Visit Plan platform attachments for RW to assess standing? transfers, sitting balance/endurance    PT Home Exercise Plan sitting balance unsupported hold 30-45sec with aide    Consulted and Agree with Plan of Care Patient    Family Member [place]           Patient will benefit from skilled therapeutic intervention in order to improve the following deficits and impairments:  Abnormal gait, Decreased balance, Decreased endurance, Decreased mobility, Difficulty walking, Hypomobility, Impaired sensation, Decreased range of motion, Improper body mechanics, Impaired perceived functional ability, Decreased activity tolerance, Decreased knowledge of use of DME, Decreased safety awareness, Decreased strength, Impaired flexibility, Impaired UE functional use, Postural dysfunction  Visit Diagnosis: Muscle weakness (generalized)  Other lack of coordination     Problem List Patient Active Problem List   Diagnosis Date Noted  . Acute appendicitis with appendiceal abscess 05/30/2018  . Hypocalcemia 02/15/2018  . Dysuria 02/15/2018  . Bilateral lower  extremity edema 02/11/2018  . Vaginal yeast infection 01/30/2018  . Chest tightness 01/27/2018  . Healthcare-associated pneumonia 01/25/2018  . Pleural effusion, not elsewhere classified 01/25/2018  . Acute deep vein thrombosis (DVT) of left lower extremity ([place]) 01/24/2018  . Acute deep vein thrombosis (DVT) of right lower extremity ([place]) 01/24/2018  . Chronic allergic rhinitis 01/24/2018  . Depression with anxiety 01/24/2018  . UTI due to Klebsiella species 01/24/2018  . Protein-calorie malnutrition, severe ([place]) 01/24/2018  . S/P insertion of IVC (inferior vena caval) filter 01/24/2018  . Reactive depression   . Hypokalemia   . Leukocytosis   . Essential hypertension   . Trauma   . 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([place])   . Neuropathic pain   . Neurogenic bowel   . Neurogenic bladder   . Benign essential HTN   .  Acute blood loss anemia   . Central cord syndrome at C6 level of cervical spinal cord ([place]) 11/29/2017  . Central cord syndrome [facility]) 11/29/2017   Phillips Grout PT, DPT, GCS  Bertrand Vowels 11/28/2019, 3:44 PM  [place] MAIN [facility] SERVICES [address] [place], [place], [address] Phone: [phone]   Fax:  [phone]  Name: DELILA KUKLINSKI MRN: [id number] Date of Birth: 1934/11/08

## 2019-12-03 ENCOUNTER — Ambulatory Visit: Payer: Medicare HMO | Admitting: Occupational Therapy

## 2019-12-05 ENCOUNTER — Other Ambulatory Visit: Payer: Self-pay

## 2019-12-05 ENCOUNTER — Ambulatory Visit: Payer: Medicare HMO | Attending: Internal Medicine | Admitting: Occupational Therapy

## 2019-12-05 ENCOUNTER — Ambulatory Visit: Payer: Medicare HMO

## 2019-12-05 ENCOUNTER — Encounter: Payer: Self-pay | Admitting: Occupational Therapy

## 2019-12-05 DIAGNOSIS — S14129S Central cord syndrome at unspecified level of cervical spinal cord, sequela: Secondary | ICD-10-CM | POA: Insufficient documentation

## 2019-12-05 DIAGNOSIS — X58XXXS Exposure to other specified factors, sequela: Secondary | ICD-10-CM | POA: Insufficient documentation

## 2019-12-05 DIAGNOSIS — R278 Other lack of coordination: Secondary | ICD-10-CM | POA: Insufficient documentation

## 2019-12-05 DIAGNOSIS — R2689 Other abnormalities of gait and mobility: Secondary | ICD-10-CM | POA: Diagnosis present

## 2019-12-05 DIAGNOSIS — R140 Abdominal distension (gaseous): Secondary | ICD-10-CM | POA: Insufficient documentation

## 2019-12-05 DIAGNOSIS — M6281 Muscle weakness (generalized): Secondary | ICD-10-CM | POA: Diagnosis present

## 2019-12-05 DIAGNOSIS — O2230 Deep phlebothrombosis in pregnancy, unspecified trimester: Secondary | ICD-10-CM | POA: Insufficient documentation

## 2019-12-05 NOTE — Therapy (Signed)
[place] MAIN [facility] SERVICES [address] [place], [place], [address] Phone: [phone]   Fax:  [phone]  Physical Therapy Treatment  Patient Details  Name: Sherry [name] MRN: [id number] Date of Birth: 28-Oct-1934 Referring Provider (PT): Viviana Simpler   Encounter Date: 12/05/2019   PT End of Session - 12/05/19 1528    Visit Number 19    Number of Visits 25    Date for PT Re-Evaluation 01/14/20    Authorization Type aetna medicare FOTO performed by PT on eval (7/20), score 12    PT Start Time 1521    PT Stop Time 1600    PT Time Calculation (min) 39 min    Activity Tolerance Patient tolerated treatment well;No increased pain;Patient limited by fatigue    Behavior During Therapy [facility] for tasks assessed/performed           Past Medical History:  Diagnosis Date  . Acute blood loss anemia   . Arthritis   . Cancer ([place])    skin  . Central cord syndrome at C6 level of cervical spinal cord ([place]) 11/29/2017  . Hypertension   . Protein-calorie malnutrition, severe ([place]) 01/24/2018  . S/P insertion of IVC (inferior vena caval) filter 01/24/2018  . 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[facility])     Past Surgical History:  Procedure Laterality Date  . ANTERIOR CERVICAL DECOMP/DISCECTOMY FUSION N/A 11/29/2017   Procedure: Cervical five-six, six-seven Anterior Cervical Decompression Fusion;  Surgeon: Judith Part, MD;  Location: [place];  Service: Neurosurgery;  Laterality: N/A;  Cervical five-six, six-seven Anterior Cervical Decompression Fusion  . CATARACT EXTRACTION    . IR IVC FILTER PLMT / S&I /IMG GUID/MOD SED  12/08/2017    There were no vitals filed for this visit.   Subjective Assessment - 12/05/19 1528    Subjective Pt reports doing fine today. No pain reported upon arrival. No changes in health/medications. No specific questions/concerns.    Pertinent History Pt is an 85 yo female that fell in 2019, fractured vertebrae in her neck and in her  low back per family. 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 After hospital stay, pt discharged to CIR for ~1 month, experienced severe UTI as well as bilateral DVT (IVC filter placed). Discharged to [facility], stayed for about a year, and then moved to [facility] in April 2021. Was receiving PT, but per family facility reported that did not have adequate equipment to maximize PT for her. Pt until about 1 month ago was practicing sit to stand transfers with 1-2 people, and working on static standing. Has a brace for L foot due to PF. Pt currently needs assistance with all ADLs (able to assist with donning/doffing her shirt), bed baths, and needs a hoyer lift for transfers. Able to drive power wheelchair without assistance.    Currently in Pain? No/denies              TREATMENT   Ther-ex Hoyer lift from power wc <>mat; Supine hip abduction2 x 10BLE, with manual resistance;  Supine hip adduction2 x 10BLE, with manual resistance; Supine SLR with assistx 10 BLE; Supine quad and glut sets 3s hold x 10 BLE; Hooklying heel slides with resisted extensionx 10 bilateral lower extremity; Hooklying marchingx 10BLE, assist required forBLE; Hooklying SAQ over bolster with light manual resistance for LLE, assist for RLE to get to end range extension x 10BLE, muscle tapping at R quad to encourage improved muscle activation;  Hooklying bridges over bolster under [employer]  holdx10; Penguins (oblique lateral crunch) x 10 each side; Partial crunches with assist from therapist 2-3s hold x 10;   Therapeutic Activity Rolling left and right with mod assistx1and therapist providing cues for proper sequencing x 5 toward each side;   Pt educated throughout session about proper posture and technique with exercises. Improved exercise technique, movement at target joints, use of target muscles after min to mod verbal, visual, tactile cues.   Continued with mat table  exercises today.  Patient is able to complete all exercises as instructed today.  Also continuedto work on [place] with rolling left and right and patient is demonstrating improved initiation and core strength during rolling. Patient encouraged to continue her HEP and follow-up as scheduled.  She will need updated outcome measures and goals as well as a progress note at next visit. She is demonstrating improved strength during mat table exercises.She will continue to benefit from additional skilled physical therapy services to continue address deficits in strength, balance, and functional mobility.                            PT Short Term Goals - 10/22/19 1313      PT SHORT TERM GOAL #1   Title The patient will perform initial HEP with minimum assistance in order to improve strength and function.    Time 4    Period Weeks    Status On-going    Target Date 12/03/19             PT Long Term Goals - 10/22/19 1313      PT LONG TERM GOAL #1   Title The patient will be compliant with finalized HEP with minimum assistance in preparation for self management and maintenance of condition.    Time 12    Period Weeks    Status On-going    Target Date 01/14/20      PT LONG TERM GOAL #2   Title The patient will demonstrate at least 10 point improvement on FOTO score indicating an improved ability to perform functional activities.    Baseline on eval (/720) score 12; 10/22/19: 12    Time 12    Period Weeks    Status On-going    Target Date 01/14/20      PT LONG TERM GOAL #3   Title The patient will demonstrate lateral scooting  for assistance with transfers with minA to maximize independence.    Baseline 10/22/19: Pt requires modA+1 for lateral scooting    Time 8    Period Weeks    Status On-going    Target Date 01/14/20      PT LONG TERM GOAL #4   Title The patient will demonstrate a squat pivot transfer with minimum assistance to maximize independence and  mobility.    Baseline 10/22/19: unable to safely attempt at this time    Time 12    Period Weeks    Status On-going    Target Date 01/14/20      PT LONG TERM GOAL #5   Title The patient will demonstrate sitting without UE support for 2-5 minutes at EOB to improve participation and maximize independence with ADLs.    Baseline 10/22/19: Pt able ot sit unsupported for at least 2 minutes at edge of bed    Time 12    Period Weeks    Status Achieved    Target Date --  Plan - 12/05/19 1528    Clinical Impression Statement Continued with mat table exercises today.  Patient is able to complete all exercises as instructed today.  Also continued to work on [place] with rolling left and right and patient is demonstrating improved initiation and core strength during rolling. Patient encouraged to continue her HEP and follow-up as scheduled.  She will need updated outcome measures and goals as well as a progress note at next visit. She is demonstrating improved strength during mat table exercises. She will continue to benefit from additional skilled physical therapy services to continue address deficits in strength, balance, and functional mobility.    Personal Factors and Comorbidities Age;Time since onset of injury/illness/exacerbation;Comorbidity 3+;Fitness    Comorbidities HTN, quadriparesis, history of DVT, neurogenic bladder    Examination-Activity Limitations Bathing;Hygiene/Grooming;Squat;Bed Mobility;Lift;Bend;Stand;Engineer, manufacturing;Toileting;Self Feeding;Transfers;Continence;Sit;Dressing;Sleep;Carry    Examination-Participation Restrictions Church;Laundry;Cleaning;Medication Management;Community Activity;Meal Prep;Interpersonal Relationship    Stability/Clinical Decision Making Evolving/Moderate complexity    Rehab Potential Fair    PT Frequency 2x / week    PT Duration 12 weeks    PT Treatment/Interventions ADLs/Self Care Home Management;Electrical  Stimulation;Therapeutic activities;Wheelchair mobility training;Vasopneumatic Device;Joint Manipulations;Vestibular;Passive range of motion;Patient/family education;Therapeutic exercise;DME Instruction;Biofeedback;Aquatic Therapy;Moist Heat;Gait training;Balance training;Orthotic Fit/Training;Dry needling;Energy conservation;Taping;Splinting;Neuromuscular re-education;Cryotherapy;Ultrasound;Functional mobility training    PT Next Visit Plan Update goals, progress note, platform attachments for RW to assess standing? transfers, sitting balance/endurance    PT Home Exercise Plan sitting balance unsupported hold 30-45sec with aide    Consulted and Agree with Plan of Care Patient    Family Member [place]           Patient will benefit from skilled therapeutic intervention in order to improve the following deficits and impairments:  Abnormal gait, Decreased balance, Decreased endurance, Decreased mobility, Difficulty walking, Hypomobility, Impaired sensation, Decreased range of motion, Improper body mechanics, Impaired perceived functional ability, Decreased activity tolerance, Decreased knowledge of use of DME, Decreased safety awareness, Decreased strength, Impaired flexibility, Impaired UE functional use, Postural dysfunction  Visit Diagnosis: Muscle weakness (generalized)  Other lack of coordination     Problem List Patient Active Problem List   Diagnosis Date Noted  . Acute appendicitis with appendiceal abscess 05/30/2018  . Hypocalcemia 02/15/2018  . Dysuria 02/15/2018  . Bilateral lower extremity edema 02/11/2018  . Vaginal yeast infection 01/30/2018  . Chest tightness 01/27/2018  . Healthcare-associated pneumonia 01/25/2018  . Pleural effusion, not elsewhere classified 01/25/2018  . Acute deep vein thrombosis (DVT) of left lower extremity ([place]) 01/24/2018  . Acute deep vein thrombosis (DVT) of right lower extremity ([place]) 01/24/2018  . Chronic allergic rhinitis  01/24/2018  . Depression with anxiety 01/24/2018  . UTI due to Klebsiella species 01/24/2018  . Protein-calorie malnutrition, severe ([place]) 01/24/2018  . S/P insertion of IVC (inferior vena caval) filter 01/24/2018  . Reactive depression   . Hypokalemia   . Leukocytosis   . Essential hypertension   . Trauma   . 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([place])   . Neuropathic pain   . Neurogenic bowel   . Neurogenic bladder   . Benign essential HTN   . Acute blood loss anemia   . Central cord syndrome at C6 level of cervical spinal cord ([place]) 11/29/2017  . Central cord syndrome [facility]) 11/29/2017   Phillips Grout PT, DPT, GCS  Anays Detore 12/06/2019, 11:39 AM  [place] MAIN [facility] SERVICES [address] [place], [place], [address] Phone: [phone]   Fax:  [phone]  Name: NATLIE ASFOUR MRN: [id number] Date of  Birth: February 24, 1934

## 2019-12-05 NOTE — Therapy (Signed)
[place] MAIN [facility] SERVICES [address] [place], [place], [address] Phone: [phone]   Fax:  [phone]  OT Treatment/Recertifcation Note  Patient Details  Name: Sherry [name] MRN: [id number] Date of Birth: [birth year] No data recorded  Encounter Date: 12/05/2019   OT End of Session - 12/05/19 1658    Visit Number 13    Number of Visits 24    Date for OT Re-Evaluation 02/27/20    Authorization Time Period Progress report period starting 09/17/19    OT Start Time 1430    OT Stop Time 1520    OT Time Calculation (min) 50 min    Activity Tolerance Patient tolerated treatment well    Behavior During Therapy [facility] for tasks assessed/performed           Past Medical History:  Diagnosis Date  . Acute blood loss anemia   . Arthritis   . Cancer ([place])    skin  . Central cord syndrome at C6 level of cervical spinal cord ([place]) 11/29/2017  . Hypertension   . Protein-calorie malnutrition, severe ([place]) 01/24/2018  . S/P insertion of IVC (inferior vena caval) filter 01/24/2018  . 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[facility])     Past Surgical History:  Procedure Laterality Date  . ANTERIOR CERVICAL DECOMP/DISCECTOMY FUSION N/A 11/29/2017   Procedure: Cervical five-six, six-seven Anterior Cervical Decompression Fusion;  Surgeon: Judith Part, MD;  Location: [place];  Service: Neurosurgery;  Laterality: N/A;  Cervical five-six, six-seven Anterior Cervical Decompression Fusion  . CATARACT EXTRACTION    . IR IVC FILTER PLMT / S&I /IMG GUID/MOD SED  12/08/2017    There were no vitals filed for this visit.   Subjective Assessment - 12/05/19 1657    Subjective  Pt reports that she had a nice weekend    Pertinent History Patient s/p fall November 29, 2017 resulting in diagnosis of central cord syndrome at C 6 level.  She has had therapy in multiple venues but with recent move to [facility], therapy staff recommended she seek outpatient therapy for her needs.     Patient Stated Goals Patient would like to be able to move better in bed, stand and perform self care tasks.    Currently in Pain? No/denies              [facility] OT Assessment - 12/05/19 0001      AROM   Right Shoulder Flexion 98 Degrees    Right Shoulder ABduction 92 Degrees    Left Shoulder Flexion 124 Degrees    Left Shoulder ABduction 92 Degrees    Right Elbow Flexion 150    Right Elbow Extension -8    Left Elbow Flexion 150    Left Elbow Extension 0    Right Forearm Pronation 70 Degrees    Right Forearm Supination 90 Degrees    Left Forearm Pronation 65 Degrees    Left Forearm Supination 90 Degrees    Right Wrist Extension 30 Degrees    Right Wrist Flexion 45 Degrees    Right Wrist Radial Deviation 10 Degrees    Right Wrist Ulnar Deviation 15 Degrees    Left Wrist Extension -5 Degrees    Left Wrist Flexion 70 Degrees    Left Wrist Radial Deviation 20 Degrees    Left Wrist Ulnar Deviation 15 Degrees      Right Hand AROM   R Index  MCP 0-90 85 Degrees   extension 0   R Index PIP 0-100 75 Degrees  extension 0   R Index DIP 0-70 30 Degrees   extension -10   R Long  MCP 0-90 95 Degrees   extension -5   R Long PIP 0-100 60 Degrees   extension 0   R Long DIP 0-70 20 Degrees   extension 0   R Ring  MCP 0-90 80 Degrees   extension -5   R Ring PIP 0-100 35 Degrees   extension 0   R Ring DIP 0-70 20 Degrees   extension 0   R Little  MCP 0-90 75 Degrees    extension -5   R Little PIP 0-100 40 Degrees   extension -30   R Little DIP 0-70 20 Degrees   extension -25     Left Hand AROM   L Index  MCP 0-90 35 Degrees   extension 30   L Index PIP 0-100 10 Degrees   extension 0   L Index DIP 0-70 35 Degrees   extension 0   L Long  MCP 0-90 15 Degrees   extension 0   L Long PIP 0-100 40 Degrees   extension 0   L Long DIP 0-70 35 Degrees   extension 20   L Ring  MCP 0-90 15 Degrees   extension 0   L Ring PIP 0-100 20 Degrees   extension -20   L Ring DIP 0-70 10 Degrees    extension -10   L Little  MCP 0-90 10 Degrees   extension 0   L Little PIP 0-100 20 Degrees   extension -20   L Little DIP 0-70 30 Degrees   extension -10     Hand Function   Right Hand Lateral Pinch 8 lbs    Left Hand Lateral Pinch 4 lbs           Measurements were obtained, and goals were reviewed, and modified with the patient. Pt. has made progress with BUE, and digit MP, PIP, and DIP flexion and extension. Pt. has improved with bilateral lateral pinch strengthening. Pt. is now able to consistently able to perform hand to face patterns needed for self feeding. Pt. is able to hold a utensil with her right hand, as well as use a universal cuff during meals. Pt. is able to comb the top, and sides of her head. She is also able to reach the back of her head, however can not see to brush the very back of her hair.  Pt. continues to work on improving BUE ROM in order to work towards improving bilateral UE functioning during ADLs, and IADL tasks.                 OT Education - 12/05/19 1657    Education Details HEP    Person(s) Educated Patient;Caregiver(s)    Methods Explanation;Demonstration    Comprehension Verbalized understanding;Need further instruction               OT Long Term Goals - 12/05/19 1511      OT LONG TERM GOAL #1   Title Patient and caregiver will demonstrate understanding of home exercise program for ROM.    Baseline Pt. has a restorative aide rehab aide assist her wih exercises at [facility]    Time 12    Period Weeks    Status On-going    Target Date 02/27/20      OT LONG TERM GOAL #2   Title Patient will demonstrate ability to don shirt with min assist from  seated position.    Baseline indepedent with a larger shirt. Requires increased time to complete    Time 12    Period Weeks    Target Date 02/27/20      OT LONG TERM GOAL #3   Title Patient will demonstrate improved sitting balance at the edge of the bed to participate in self care  tasks.    Baseline Pt. has an air mattress at [facility]    Time 12    Period Weeks    Status Deferred      OT LONG TERM GOAL #4   Title Patient will demonstrate improved composite finger flexion to hold deodorant to apply to underarms.    Baseline Pt with improving finger flexion of R hand to use partial cylindrical grasp, but unable to grasp and hold items. Very limited digit flexion of L hand.    Time 12    Period Weeks    Status On-going    Target Date 02/27/20      OT LONG TERM GOAL #5   Title Pt will complete self feeding with setup, use of universal cuff and minimal spillage.    Baseline inconsistent with feeding and increased spillage.    Time 12    Period Weeks    Status Achieved      Long Term Additional Goals   Additional Long Term Goals Yes      OT LONG TERM GOAL #6   Title Patient will increase right UE ROM to comb the back of her hair with modified independence.    Baseline Pt. is able to comb the front, and sides of her head. Pt. can now reach the back, however cannot see what she is doing in the back of her head.    Time 12    Period Weeks    Status Partially Met    Target Date 02/27/20      OT LONG TERM GOAL #8   Title Pt. will write, and sign her name with 100% legibility, and modified independence.    Baseline Pt. has difficulty holding, and manipulating a pen for writing.    Time 12    Period Weeks    Status New    Target Date 02/27/20                 Plan - 12/05/19 1659    Clinical Impression Statement Measurements were obtained, and goals were reviewed, and modified with the patient. Pt. has made progress with BUE, and digit MP, PIP, and DIP flexion and extension. Pt. has improved with bilateral lateral pinch strengthening. Pt. is now able to consistently able to perform hand to face patterns needed for self feeding. Pt. is able to hold a utensil with her right hand, as well as use a universal cuff during meals. Pt. is able to comb the top, and  sides of her head. She is also able to reach the back of her head, however can not see to brush the very back of her hair.  Pt. continues to work on improving BUE ROM in order to work towards improving bilateral UE functioning during ADLs, and IADL tasks.   OT Occupational Profile and History Detailed Assessment- Review of Records and additional review of physical, cognitive, psychosocial history related to current functional performance    Body Structure / Function / Physical Skills ADL;Continence;Dexterity;Flexibility;Strength;ROM;Balance;Coordination;FMC;IADL;Endurance;UE functional use;Decreased knowledge of use of DME;GMC    Rehab Potential Fair    Clinical Decision Making Limited treatment options,  no task modification necessary    Comorbidities Affecting Occupational Performance: Presence of comorbidities impacting occupational performance    Modification or Assistance to Complete Evaluation  Min-Moderate modification of tasks or assist with assess necessary to complete eval    OT Frequency 2x / week    OT Duration 12 weeks    OT Treatment/Interventions Self-care/ADL training;Cryotherapy;Therapeutic exercise;DME and/or AE instruction;Balance training;Neuromuscular education;Manual Therapy;Splinting;Moist Heat;Passive range of motion;Therapeutic activities;Patient/family education    Consulted and Agree with Plan of Care Patient           Patient will benefit from skilled therapeutic intervention in order to improve the following deficits and impairments:   Body Structure / Function / Physical Skills: ADL, Continence, Dexterity, Flexibility, Strength, ROM, Balance, Coordination, FMC, IADL, Endurance, UE functional use, Decreased knowledge of use of DME, GMC       Visit Diagnosis: Muscle weakness (generalized)    Problem List Patient Active Problem List   Diagnosis Date Noted  . Acute appendicitis with appendiceal abscess 05/30/2018  . Hypocalcemia 02/15/2018  . Dysuria  02/15/2018  . Bilateral lower extremity edema 02/11/2018  . Vaginal yeast infection 01/30/2018  . Chest tightness 01/27/2018  . Healthcare-associated pneumonia 01/25/2018  . Pleural effusion, not elsewhere classified 01/25/2018  . Acute deep vein thrombosis (DVT) of left lower extremity ([place]) 01/24/2018  . Acute deep vein thrombosis (DVT) of right lower extremity ([place]) 01/24/2018  . Chronic allergic rhinitis 01/24/2018  . Depression with anxiety 01/24/2018  . UTI due to Klebsiella species 01/24/2018  . Protein-calorie malnutrition, severe ([place]) 01/24/2018  . S/P insertion of IVC (inferior vena caval) filter 01/24/2018  . Reactive depression   . Hypokalemia   . Leukocytosis   . Essential hypertension   . Trauma   . 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([place])   . Neuropathic pain   . Neurogenic bowel   . Neurogenic bladder   . Benign essential HTN   . Acute blood loss anemia   . Central cord syndrome at C6 level of cervical spinal cord ([place]) 11/29/2017  . Central cord syndrome [facility]) 11/29/2017    Harrel Carina, MS, OTR/L 12/05/2019, 5:25 PM  [place] MAIN [facility] SERVICES [address] [place], [place], [address] Phone: [phone]   Fax:  [phone]  Name: Sherry [name] MRN: [id number] Date of Birth: [birth year]

## 2019-12-06 DIAGNOSIS — G8220 Paraplegia, unspecified: Secondary | ICD-10-CM | POA: Diagnosis not present

## 2019-12-06 DIAGNOSIS — F39 Unspecified mood [affective] disorder: Secondary | ICD-10-CM

## 2019-12-06 DIAGNOSIS — I10 Essential (primary) hypertension: Secondary | ICD-10-CM

## 2019-12-06 DIAGNOSIS — K592 Neurogenic bowel, not elsewhere classified: Secondary | ICD-10-CM | POA: Diagnosis not present

## 2019-12-06 DIAGNOSIS — N310 Uninhibited neuropathic bladder, not elsewhere classified: Secondary | ICD-10-CM | POA: Diagnosis not present

## 2019-12-06 DIAGNOSIS — I82891 Chronic embolism and thrombosis of other specified veins: Secondary | ICD-10-CM | POA: Diagnosis not present

## 2019-12-10 ENCOUNTER — Ambulatory Visit: Payer: Medicare HMO

## 2019-12-10 ENCOUNTER — Other Ambulatory Visit: Payer: Self-pay

## 2019-12-10 ENCOUNTER — Ambulatory Visit: Payer: Medicare HMO | Admitting: Occupational Therapy

## 2019-12-10 DIAGNOSIS — M6281 Muscle weakness (generalized): Secondary | ICD-10-CM | POA: Diagnosis not present

## 2019-12-10 DIAGNOSIS — R278 Other lack of coordination: Secondary | ICD-10-CM

## 2019-12-10 NOTE — Therapy (Signed)
[place] MAIN [facility] SERVICES [address] [place], [place], [address] Phone: [phone]   Fax:  [phone]  Physical Therapy Treatment/Progress Note  Dates of reporting period  10/22/19   to   12/10/19  Patient Details  Name: Sherry [name] MRN: [id number] Date of Birth: February 01, 1935 Referring Provider (PT): Viviana Simpler   Encounter Date: 12/10/2019   PT End of Session - 12/10/19 1529    Visit Number 20    Number of Visits 25    Date for PT Re-Evaluation 01/14/20    Authorization Type aetna medicare FOTO performed by PT on eval (7/20), score 12    PT Start Time 1515    PT Stop Time 1600    PT Time Calculation (min) 45 min    Activity Tolerance Patient tolerated treatment well;No increased pain;Patient limited by fatigue    Behavior During Therapy [facility] for tasks assessed/performed           Past Medical History:  Diagnosis Date  . Acute blood loss anemia   . Arthritis   . Cancer ([place])    skin  . Central cord syndrome at C6 level of cervical spinal cord ([place]) 11/29/2017  . Hypertension   . Protein-calorie malnutrition, severe ([place]) 01/24/2018  . S/P insertion of IVC (inferior vena caval) filter 01/24/2018  . 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[facility])     Past Surgical History:  Procedure Laterality Date  . ANTERIOR CERVICAL DECOMP/DISCECTOMY FUSION N/A 11/29/2017   Procedure: Cervical five-six, six-seven Anterior Cervical Decompression Fusion;  Surgeon: Judith Part, MD;  Location: [place];  Service: Neurosurgery;  Laterality: N/A;  Cervical five-six, six-seven Anterior Cervical Decompression Fusion  . CATARACT EXTRACTION    . IR IVC FILTER PLMT / S&I /IMG GUID/MOD SED  12/08/2017    There were no vitals filed for this visit.   Subjective Assessment - 12/10/19 1524    Subjective Pt reports doing fine today. No pain reported upon arrival. No changes in health/medications. No specific questions/concerns.    Pertinent History Pt is an 85 yo  female that fell in 2019, fractured vertebrae in her neck and in her low back per family. 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 After hospital stay, pt discharged to CIR for ~1 month, experienced severe UTI as well as bilateral DVT (IVC filter placed). Discharged to [facility], stayed for about a year, and then moved to [facility] in April 2021. Was receiving PT, but per family facility reported that did not have adequate equipment to maximize PT for her. Pt until about 1 month ago was practicing sit to stand transfers with 1-2 people, and working on static standing. Has a brace for L foot due to PF. Pt currently needs assistance with all ADLs (able to assist with donning/doffing her shirt), bed baths, and needs a hoyer lift for transfers. Able to drive power wheelchair without assistance.    Currently in Pain? No/denies                TREATMENT   Ther-ex Hoyer lift from power wc <>mat; Supine hip abduction x 10BLE, with manual resistance;  Supine hip adduction x 10BLE, with manual resistance; Supine SLR with assistx 10 BLE; Supine quad and glut sets 3s hold x 10 BLE; Hooklying heel slides with resisted extensionx 10 bilateral lower extremity; Hooklying marchingx 10BLE, assist required forBLE; Hooklying SAQ over bolster with light manual resistance for LLE, assist for RLE to get to end range extension x 10BLE, muscle  tapping at R quad to encourage improved muscle activation;  Hooklying bridges over bolster under knees3s holdx10; Penguins (oblique lateral crunch) x 10 each side; Partial crunches with assist from therapist 2-3s hold x 10; Seated balance incorporating dynamic reaching to targets provided by therapist in a variety of directions including cross body; Seated LAQ with assist for RLE x 10 BLE;   Therapeutic Activity Rolling left and right with mod assistx1and therapist providing cues for proper sequencing x 5 toward each side;   Pt  educated throughout session about proper posture and technique with exercises. Improved exercise technique, movement at target joints, use of target muscles after min to mod verbal, visual, tactile cues.   Outcome measures and goals updated with patient today.  Patient reports that she feels like her strength has been improving since starting with therapy. She is noticing improvement in her ability to reposition in bed as well as in her wheelchair in order to offload and minimize her risk for pressure sores. Her FOTO score improved today to 18 today. She still requires moderate assistance for lateral scoot transfers and is too unsafe to attempt squat pivot transfers at this time.  She reports consistent performance of her HEP.  Patient encouraged to continue her HEP and follow-up as scheduled. She has not yet achieved maximal benefit from PT services and demonstrates the ability to demonstrate continued improvement. She will continue to benefit from additional skilled physical therapy services to continue address deficits in strength, balance, and functional mobility.                           PT Short Term Goals - 12/10/19 1531      PT SHORT TERM GOAL #1   Title The patient will perform initial HEP with minimum assistance in order to improve strength and function.    Time 4    Period Weeks    Status On-going    Target Date 12/03/19             PT Long Term Goals - 12/10/19 1534      PT LONG TERM GOAL #1   Title The patient will be compliant with finalized HEP with minimum assistance in preparation for self management and maintenance of condition.    Time 12    Period Weeks    Status On-going    Target Date 01/14/20      PT LONG TERM GOAL #2   Title The patient will demonstrate at least 10 point improvement on FOTO score indicating an improved ability to perform functional activities.    Baseline on eval (/720) score 12; 10/22/19: 12; 12/10/19: 18    Time 12     Period Weeks    Status On-going    Target Date 01/14/20      PT LONG TERM GOAL #3   Title The patient will demonstrate lateral scooting  for assistance with transfers with minA to maximize independence.    Baseline 10/22/19: Pt requires modA+1 for lateral scooting; 12/10/19: modA+1    Time 8    Period Weeks    Status On-going    Target Date 01/14/20      PT LONG TERM GOAL #4   Title The patient will demonstrate a squat pivot transfer with minimum assistance to maximize independence and mobility.    Baseline 10/22/19: unable to safely attempt at this time; 12/10/19: unable to safely attempt at this time    Time 12  Period Weeks    Status On-going    Target Date 01/14/20      PT LONG TERM GOAL #5   Title The patient will demonstrate sitting without UE support for 2-5 minutes at EOB to improve participation and maximize independence with ADLs.    Baseline 10/22/19: Pt able ot sit unsupported for at least 2 minutes at edge of bed    Time 12    Period Weeks    Status Achieved                 Plan - 12/10/19 1530    Clinical Impression Statement Outcome measures and goals updated with patient today.  Patient reports that she feels like her strength has been improving since starting with therapy. She is noticing improvement in her ability to reposition in bed as well as in her wheelchair in order to offload and minimize her risk for pressure sores. Her FOTO score improved today to 18 today. She still requires moderate assistance for lateral scoot transfers and is too unsafe to attempt squat pivot transfers at this time.  She reports consistent performance of her HEP.  Patient encouraged to continue her HEP and follow-up as scheduled. She has not yet achieved maximal benefit from PT services and demonstrates the ability to demonstrate continued improvement. She will continue to benefit from additional skilled physical therapy services to continue address deficits in strength, balance, and  functional mobility.    Personal Factors and Comorbidities Age;Time since onset of injury/illness/exacerbation;Comorbidity 3+;Fitness    Comorbidities HTN, quadriparesis, history of DVT, neurogenic bladder    Examination-Activity Limitations Bathing;Hygiene/Grooming;Squat;Bed Mobility;Lift;Bend;Stand;Engineer, manufacturing;Toileting;Self Feeding;Transfers;Continence;Sit;Dressing;Sleep;Carry    Examination-Participation Restrictions Church;Laundry;Cleaning;Medication Management;Community Activity;Meal Prep;Interpersonal Relationship    Stability/Clinical Decision Making Evolving/Moderate complexity    Rehab Potential Fair    PT Frequency 2x / week    PT Duration 12 weeks    PT Treatment/Interventions ADLs/Self Care Home Management;Electrical Stimulation;Therapeutic activities;Wheelchair mobility training;Vasopneumatic Device;Joint Manipulations;Vestibular;Passive range of motion;Patient/family education;Therapeutic exercise;DME Instruction;Biofeedback;Aquatic Therapy;Moist Heat;Gait training;Balance training;Orthotic Fit/Training;Dry needling;Energy conservation;Taping;Splinting;Neuromuscular re-education;Cryotherapy;Ultrasound;Functional mobility training    PT Next Visit Plan platform attachments for RW to assess standing? transfers, sitting balance/endurance    PT Home Exercise Plan sitting balance unsupported hold 30-45sec with aide    Consulted and Agree with Plan of Care Patient    Family Member [place]           Patient will benefit from skilled therapeutic intervention in order to improve the following deficits and impairments:  Abnormal gait, Decreased balance, Decreased endurance, Decreased mobility, Difficulty walking, Hypomobility, Impaired sensation, Decreased range of motion, Improper body mechanics, Impaired perceived functional ability, Decreased activity tolerance, Decreased knowledge of use of DME, Decreased safety awareness, Decreased strength, Impaired  flexibility, Impaired UE functional use, Postural dysfunction  Visit Diagnosis: Muscle weakness (generalized)     Problem List Patient Active Problem List   Diagnosis Date Noted  . Acute appendicitis with appendiceal abscess 05/30/2018  . Hypocalcemia 02/15/2018  . Dysuria 02/15/2018  . Bilateral lower extremity edema 02/11/2018  . Vaginal yeast infection 01/30/2018  . Chest tightness 01/27/2018  . Healthcare-associated pneumonia 01/25/2018  . Pleural effusion, not elsewhere classified 01/25/2018  . Acute deep vein thrombosis (DVT) of left lower extremity ([place]) 01/24/2018  . Acute deep vein thrombosis (DVT) of right lower extremity ([place]) 01/24/2018  . Chronic allergic rhinitis 01/24/2018  . Depression with anxiety 01/24/2018  . UTI due to Klebsiella species 01/24/2018  . Protein-calorie malnutrition, severe ([place]) 01/24/2018  . S/P insertion  of IVC (inferior vena caval) filter 01/24/2018  . Reactive depression   . Hypokalemia   . Leukocytosis   . Essential hypertension   . Trauma   . 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([place])   . Neuropathic pain   . Neurogenic bowel   . Neurogenic bladder   . Benign essential HTN   . Acute blood loss anemia   . Central cord syndrome at C6 level of cervical spinal cord ([place]) 11/29/2017  . Central cord syndrome [facility] 11/29/2017   Phillips Grout PT, DPT, GCS  Riverlyn Kizziah 12/11/2019, 11:48 AM  [place] MAIN [facility] SERVICES [address] [place], [place], [address] Phone: [phone]   Fax:  [phone]  Name: Sherry [name] MRN: [id number] Date of Birth: November 08, 1934

## 2019-12-11 ENCOUNTER — Encounter: Payer: Self-pay | Admitting: Occupational Therapy

## 2019-12-11 NOTE — Therapy (Signed)
[place] MAIN [facility] SERVICES [address] [place], [place], [address] Phone: [phone]   Fax:  [phone]  Occupational Therapy Treatment  Patient Details  Name: Sherry [name] MRN: [id number] Date of Birth: Dec 29, 1934 No data recorded  Encounter Date: 12/10/2019   OT End of Session - 12/11/19 1145    Visit Number 14    Number of Visits 24    Date for OT Re-Evaluation 02/27/20    OT Start Time 1430    OT Stop Time 1515    OT Time Calculation (min) 45 min    Equipment Utilized During Treatment moist heat    Activity Tolerance Patient tolerated treatment well    Behavior During Therapy [facility] for tasks assessed/performed           Past Medical History:  Diagnosis Date   Acute blood loss anemia    Arthritis    Cancer ([place])    skin   Central cord syndrome at C6 level of cervical spinal cord ([place]) 11/29/2017   Hypertension    Protein-calorie malnutrition, severe ([place]) 01/24/2018   S/P insertion of IVC (inferior vena caval) filter 01/24/2018   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([place])     Past Surgical History:  Procedure Laterality Date   ANTERIOR CERVICAL DECOMP/DISCECTOMY FUSION N/A 11/29/2017   Procedure: Cervical five-six, six-seven Anterior Cervical Decompression Fusion;  Surgeon: Judith Part, MD;  Location: [place];  Service: Neurosurgery;  Laterality: N/A;  Cervical five-six, six-seven Anterior Cervical Decompression Fusion   CATARACT EXTRACTION     IR IVC FILTER PLMT / S&I /IMG GUID/MOD SED  12/08/2017    There were no vitals filed for this visit.   Subjective Assessment - 12/11/19 1144    Subjective  Pt reports that she had a nice weekend    Pertinent History Patient s/p fall November 29, 2017 resulting in diagnosis of central cord syndrome at C 6 level.  She has had therapy in multiple venues but with recent move to [facility], therapy staff recommended she seek outpatient therapy for her needs.    Currently in Pain?  No/denies           OT Treatment  Pt.tolerated bilateral shoulder flexion, extension, abduction, elbow flexion, extension forearm supination AAROM/AROM. PROM bilateral wrist flexion, and extension.Pt.worked onbilateral simultaneous AROM using a medium sizedcircular spherefor bilateral shoulder flexion, abduction, diagonal, and circular motion.No reports of pain or discomfort with ROM.  Self-care:   Pt. worked on formulating written text using a wide width pen. Pt. required adjustments to be made to the the pen type, position of the pen in her hand, table height, and position of the pen on the table top surface. Pt. was able to write her name with 50% legibility  Pt. is making progress, and is engaging her hands during more tasks. Pt. Reports that she was able to use a pen for puzzles at home using a very specific pen.  Pt is improving with ROM in her bilateral hands, and in progressing towards increasing digit MP, PIP, and DIP extension. Pt. continues to present with limited bilateral wrist, and digit flexion secondary to extensor tightness.Pt.continues to benefit from working on improving ROM in order to work towards increasing engagement of her bilateral hands during ADLs, and IADL tasks.                       OT Education - 12/11/19 1144    Education Details UE ROM, writing  Person(s) Educated Patient;Caregiver(s)    Methods Explanation;Demonstration    Comprehension Verbalized understanding;Need further instruction               OT Long Term Goals - 12/05/19 1511      OT LONG TERM GOAL #1   Title Patient and caregiver will demonstrate understanding of home exercise program for ROM.    Baseline Pt. has a restorative aide rehab aide assist her wih exercises at [facility]    Time 12    Period Weeks    Status On-going    Target Date 02/27/20      OT LONG TERM GOAL #2   Title Patient will demonstrate ability to don shirt with min assist from  seated position.    Baseline indepedent with a larger shirt. Requires increased time to complete    Time 12    Period Weeks    Target Date 02/27/20      OT LONG TERM GOAL #3   Title Patient will demonstrate improved sitting balance at the edge of the bed to participate in self care tasks.    Baseline Pt. has an air mattress at [facility]    Time 12    Period Weeks    Status Deferred      OT LONG TERM GOAL #4   Title Patient will demonstrate improved composite finger flexion to hold deodorant to apply to underarms.    Baseline Pt with improving finger flexion of R hand to use partial cylindrical grasp, but unable to grasp and hold items. Very limited digit flexion of L hand.    Time 12    Period Weeks    Status On-going    Target Date 02/27/20      OT LONG TERM GOAL #5   Title Pt will complete self feeding with setup, use of universal cuff and minimal spillage.    Baseline inconsistent with feeding and increased spillage.    Time 12    Period Weeks    Status Achieved      Long Term Additional Goals   Additional Long Term Goals Yes      OT LONG TERM GOAL #6   Title Patient will increase right UE ROM to comb the back of her hair with modified independence.    Baseline Pt. is able to comb the front, and sides of her head. Pt. can now reach the back, however cannot see what she is doing in the back of her head.    Time 12    Period Weeks    Status Partially Met    Target Date 02/27/20      OT LONG TERM GOAL #8   Title Pt. will write, and sign her name with 100% legibility, and modified independence.    Baseline Pt. has difficulty holding, and manipulating a pen for writing.    Time 12    Period Weeks    Status New    Target Date 02/27/20                 Plan - 12/11/19 1145    Clinical Impression Statement Pt. is making progress, and is engaging her hands during more tasks. Pt. Reports that she was able to use a pen for puzzles at home using a very specific pen.   Pt is improving with ROM in her bilateral hands, and in progressing towards increasing digit MP, PIP, and DIP extension. Pt. continues to present with limited bilateral wrist, and digit flexion  secondary to extensor tightness.Pt.continues to benefit from working on improving ROM in order to work towards increasing engagement of her bilateral hands during ADLs, and IADL tasks.   OT Occupational Profile and History Detailed Assessment- Review of Records and additional review of physical, cognitive, psychosocial history related to current functional performance    Occupational performance deficits (Please refer to evaluation for details): ADL's;IADL's;Leisure;Social Participation    Body Structure / Function / Physical Skills ADL;Continence;Dexterity;Flexibility;Strength;ROM;Balance;Coordination;FMC;IADL;Endurance;UE functional use;Decreased knowledge of use of DME;GMC    Psychosocial Skills Environmental  Adaptations;Habits;Routines and Behaviors    Rehab Potential Fair    Clinical Decision Making Limited treatment options, no task modification necessary    Comorbidities Affecting Occupational Performance: Presence of comorbidities impacting occupational performance    Modification or Assistance to Complete Evaluation  No modification of tasks or assist necessary to complete eval    OT Frequency 2x / week    OT Duration 12 weeks    OT Treatment/Interventions Self-care/ADL training;Cryotherapy;Therapeutic exercise;DME and/or AE instruction;Balance training;Neuromuscular education;Manual Therapy;Splinting;Moist Heat;Passive range of motion;Therapeutic activities;Patient/family education    Consulted and Agree with Plan of Care Patient           Patient will benefit from skilled therapeutic intervention in order to improve the following deficits and impairments:   Body Structure / Function / Physical Skills: ADL, Continence, Dexterity, Flexibility, Strength, ROM, Balance, Coordination, FMC, IADL,  Endurance, UE functional use, Decreased knowledge of use of DME, GMC   Psychosocial Skills: Environmental  Adaptations, Habits, Routines and Behaviors   Visit Diagnosis: Muscle weakness (generalized)  Other lack of coordination    Problem List Patient Active Problem List   Diagnosis Date Noted   Acute appendicitis with appendiceal abscess 05/30/2018   Hypocalcemia 02/15/2018   Dysuria 02/15/2018   Bilateral lower extremity edema 02/11/2018   Vaginal yeast infection 01/30/2018   Chest tightness 01/27/2018   Healthcare-associated pneumonia 01/25/2018   Pleural effusion, not elsewhere classified 01/25/2018   Acute deep vein thrombosis (DVT) of left lower extremity ([place]) 01/24/2018   Acute deep vein thrombosis (DVT) of right lower extremity ([place]) 01/24/2018   Chronic allergic rhinitis 01/24/2018   Depression with anxiety 01/24/2018   UTI due to Klebsiella species 01/24/2018   Protein-calorie malnutrition, severe ([place]) 01/24/2018   S/P insertion of IVC (inferior vena caval) filter 01/24/2018   Reactive depression    Hypokalemia    Leukocytosis    Essential hypertension    Trauma    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([facility])    Neuropathic pain    Neurogenic bowel    Neurogenic bladder    Benign essential HTN    Acute blood loss anemia    Central cord syndrome at C6 level of cervical spinal cord ([place]) 11/29/2017   Central cord syndrome ([place]) 11/29/2017    Harrel Carina, MS, OTR/L 12/11/2019, 11:50 AM  [place] [address] [place], [place], [address] Phone: [phone]   Fax:  [phone]  Name: Sherry [name] MRN: [id number] Date of Birth: 10-Apr-1934

## 2019-12-12 ENCOUNTER — Encounter: Payer: Self-pay | Admitting: Occupational Therapy

## 2019-12-12 ENCOUNTER — Other Ambulatory Visit: Payer: Self-pay

## 2019-12-12 ENCOUNTER — Ambulatory Visit: Payer: Medicare HMO | Admitting: Occupational Therapy

## 2019-12-12 ENCOUNTER — Ambulatory Visit: Payer: Medicare HMO

## 2019-12-12 DIAGNOSIS — M6281 Muscle weakness (generalized): Secondary | ICD-10-CM | POA: Diagnosis not present

## 2019-12-12 DIAGNOSIS — R278 Other lack of coordination: Secondary | ICD-10-CM

## 2019-12-12 NOTE — Therapy (Signed)
[place] MAIN [facility] SERVICES [address] [place], [place], [address] Phone: [phone]   Fax:  [phone]  Occupational Therapy Treatment  Patient Details  Name: Sherry [name] MRN: [id number] Date of Birth: 06-24-1934 No data recorded  Encounter Date: 12/12/2019   OT End of Session - 12/12/19 1735    Visit Number 15    Number of Visits 24    Date for OT Re-Evaluation 02/27/20    Authorization Time Period Progress report period starting 09/17/19    OT Start Time 1300    OT Stop Time 1345    OT Time Calculation (min) 45 min    Activity Tolerance Patient tolerated treatment well    Behavior During Therapy [facility] for tasks assessed/performed           Past Medical History:  Diagnosis Date  . Acute blood loss anemia   . Arthritis   . Cancer ([place])    skin  . Central cord syndrome at C6 level of cervical spinal cord ([place]) 11/29/2017  . Hypertension   . Protein-calorie malnutrition, severe ([place]) 01/24/2018  . S/P insertion of IVC (inferior vena caval) filter 01/24/2018  . 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[facility])     Past Surgical History:  Procedure Laterality Date  . ANTERIOR CERVICAL DECOMP/DISCECTOMY FUSION N/A 11/29/2017   Procedure: Cervical five-six, six-seven Anterior Cervical Decompression Fusion;  Surgeon: Judith Part, MD;  Location: [place];  Service: Neurosurgery;  Laterality: N/A;  Cervical five-six, six-seven Anterior Cervical Decompression Fusion  . CATARACT EXTRACTION    . IR IVC FILTER PLMT / S&I /IMG GUID/MOD SED  12/08/2017    There were no vitals filed for this visit.   Subjective Assessment - 12/12/19 1734    Subjective  pt. reports that she is preparing to move into the new healthcare building at [facility] next week.    Pertinent History Patient s/p fall November 29, 2017 resulting in diagnosis of central cord syndrome at C 6 level.  She has had therapy in multiple venues but with recent move to [facility], therapy staff  recommended she seek outpatient therapy for her needs.    Currently in Pain? No/denies          OT Treatment  Pt.tolerated bilateral shoulder flexion, extension, abduction, elbow flexion, extension forearm supination AAROM/AROM. PROM bilateral wrist flexion, and extension, Digit MP, PIP, and DIP flexion. In conjunction with moist heat modality.  Self-care:   Pt. worked on formulating written text using a wide width pen from home. Pt. required adjustments position of the pen in her hand, and position of the writing surface. Pt. was able to write her name with 50% legibility  Pt. Continues to make progress with ROM in her bilateral hands, and in progressing towards increasing digit MP, PIP, and DIP extension. Pt. continues to present with limited bilateral wrist, and digit flexion secondary to extensor tightness. Pt. Presented with an improved pen grasp with the wide width pen from home. Pt.continues to benefit from working on improving ROM in order to work towards increasing engagement of her bilateral hands during ADLs, and IADL tasks.                       OT Education - 12/12/19 1735    Education Details UE ROM, writing    Person(s) Educated Patient;Caregiver(s)    Methods Explanation;Demonstration    Comprehension Verbalized understanding;Need further instruction  OT Long Term Goals - 12/05/19 1511      OT LONG TERM GOAL #1   Title Patient and caregiver will demonstrate understanding of home exercise program for ROM.    Baseline Pt. has a restorative aide rehab aide assist her wih exercises at [facility]    Time 12    Period Weeks    Status On-going    Target Date 02/27/20      OT LONG TERM GOAL #2   Title Patient will demonstrate ability to don shirt with min assist from seated position.    Baseline indepedent with a larger shirt. Requires increased time to complete    Time 12    Period Weeks    Target Date 02/27/20      OT  LONG TERM GOAL #3   Title Patient will demonstrate improved sitting balance at the edge of the bed to participate in self care tasks.    Baseline Pt. has an air mattress at [facility]    Time 12    Period Weeks    Status Deferred      OT LONG TERM GOAL #4   Title Patient will demonstrate improved composite finger flexion to hold deodorant to apply to underarms.    Baseline Pt with improving finger flexion of R hand to use partial cylindrical grasp, but unable to grasp and hold items. Very limited digit flexion of L hand.    Time 12    Period Weeks    Status On-going    Target Date 02/27/20      OT LONG TERM GOAL #5   Title Pt will complete self feeding with setup, use of universal cuff and minimal spillage.    Baseline inconsistent with feeding and increased spillage.    Time 12    Period Weeks    Status Achieved      Long Term Additional Goals   Additional Long Term Goals Yes      OT LONG TERM GOAL #6   Title Patient will increase right UE ROM to comb the back of her hair with modified independence.    Baseline Pt. is able to comb the front, and sides of her head. Pt. can now reach the back, however cannot see what she is doing in the back of her head.    Time 12    Period Weeks    Status Partially Met    Target Date 02/27/20      OT LONG TERM GOAL #8   Title Pt. will write, and sign her name with 100% legibility, and modified independence.    Baseline Pt. has difficulty holding, and manipulating a pen for writing.    Time 12    Period Weeks    Status New    Target Date 02/27/20                 Plan - 12/12/19 1736    Clinical Impression Statement Pt. Continues to make progress with ROM in her bilateral hands, and in progressing towards increasing digit MP, PIP, and DIP extension. Pt. continues to present with limited bilateral wrist, and digit flexion secondary to extensor tightness. Pt. Presented with an improved pen grasp with the wide width pen from home.  Pt.continues to benefit from working on improving ROM in order to work towards increasing engagement of her bilateral hands during ADLs, and IADL tasks.   OT Occupational Profile and History Detailed Assessment- Review of Records and additional review of physical, cognitive,  psychosocial history related to current functional performance    Occupational performance deficits (Please refer to evaluation for details): ADL's;IADL's;Leisure;Social Participation    Body Structure / Function / Physical Skills ADL;Continence;Dexterity;Flexibility;Strength;ROM;Balance;Coordination;FMC;IADL;Endurance;UE functional use;Decreased knowledge of use of DME;GMC    Psychosocial Skills Environmental  Adaptations;Habits;Routines and Behaviors    Rehab Potential Fair    Clinical Decision Making Limited treatment options, no task modification necessary    Comorbidities Affecting Occupational Performance: Presence of comorbidities impacting occupational performance    Modification or Assistance to Complete Evaluation  No modification of tasks or assist necessary to complete eval    OT Frequency 2x / week    OT Duration 12 weeks    OT Treatment/Interventions Self-care/ADL training;Cryotherapy;Therapeutic exercise;DME and/or AE instruction;Balance training;Neuromuscular education;Manual Therapy;Splinting;Moist Heat;Passive range of motion;Therapeutic activities;Patient/family education    Consulted and Agree with Plan of Care Patient           Patient will benefit from skilled therapeutic intervention in order to improve the following deficits and impairments:   Body Structure / Function / Physical Skills: ADL, Continence, Dexterity, Flexibility, Strength, ROM, Balance, Coordination, FMC, IADL, Endurance, UE functional use, Decreased knowledge of use of DME, GMC   Psychosocial Skills: Environmental  Adaptations, Habits, Routines and Behaviors   Visit Diagnosis: Muscle weakness (generalized)    Problem  List Patient Active Problem List   Diagnosis Date Noted  . Acute appendicitis with appendiceal abscess 05/30/2018  . Hypocalcemia 02/15/2018  . Dysuria 02/15/2018  . Bilateral lower extremity edema 02/11/2018  . Vaginal yeast infection 01/30/2018  . Chest tightness 01/27/2018  . Healthcare-associated pneumonia 01/25/2018  . Pleural effusion, not elsewhere classified 01/25/2018  . Acute deep vein thrombosis (DVT) of left lower extremity ([place]) 01/24/2018  . Acute deep vein thrombosis (DVT) of right lower extremity ([place]) 01/24/2018  . Chronic allergic rhinitis 01/24/2018  . Depression with anxiety 01/24/2018  . UTI due to Klebsiella species 01/24/2018  . Protein-calorie malnutrition, severe ([place]) 01/24/2018  . S/P insertion of IVC (inferior vena caval) filter 01/24/2018  . Reactive depression   . Hypokalemia   . Leukocytosis   . Essential hypertension   . Trauma   . 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([place])   . Neuropathic pain   . Neurogenic bowel   . Neurogenic bladder   . Benign essential HTN   . Acute blood loss anemia   . Central cord syndrome at C6 level of cervical spinal cord ([place]) 11/29/2017  . Central cord syndrome [facility]) 11/29/2017    Harrel Carina, MS, OTR/L 12/12/2019, 5:38 PM  [place] MAIN [facility] SERVICES [address] [place], [place], [address] Phone: [phone]   Fax:  [phone]  Name: Sherry [name] MRN: [id number] Date of Birth: Aug 22, 1934

## 2019-12-12 NOTE — Therapy (Signed)
[place] MAIN [facility] SERVICES [address] [place], [place], [address] Phone: [phone]   Fax:  [phone]  Physical Therapy Treatment  Patient Details  Name: Sherry [name] MRN: [id number] Date of Birth: 02-27-1934 Referring Provider (PT): Viviana Simpler   Encounter Date: 12/12/2019   PT End of Session - 12/12/19 1357    Visit Number 21    Number of Visits 25    Date for PT Re-Evaluation 01/14/20    Authorization Type aetna medicare FOTO performed by PT on eval (7/20), score 12    PT Start Time 1350    PT Stop Time 1430    PT Time Calculation (min) 40 min    Equipment Utilized During Treatment Other (comment)   Hoyer lift   Activity Tolerance Patient tolerated treatment well;No increased pain    Behavior During Therapy WFL for tasks assessed/performed           Past Medical History:  Diagnosis Date  . Acute blood loss anemia   . Arthritis   . Cancer ([place])    skin  . Central cord syndrome at C6 level of cervical spinal cord ([place]) 11/29/2017  . Hypertension   . Protein-calorie malnutrition, severe ([place]) 01/24/2018  . S/P insertion of IVC (inferior vena caval) filter 01/24/2018  . 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[facility])     Past Surgical History:  Procedure Laterality Date  . ANTERIOR CERVICAL DECOMP/DISCECTOMY FUSION N/A 11/29/2017   Procedure: Cervical five-six, six-seven Anterior Cervical Decompression Fusion;  Surgeon: Judith Part, MD;  Location: [place];  Service: Neurosurgery;  Laterality: N/A;  Cervical five-six, six-seven Anterior Cervical Decompression Fusion  . CATARACT EXTRACTION    . IR IVC FILTER PLMT / S&I /IMG GUID/MOD SED  12/08/2017    There were no vitals filed for this visit.   Subjective Assessment - 12/12/19 1356    Subjective Patient denies of any pain or new symptoms since last therapy session.    Pertinent History Pt is an 85 yo female that fell in 2019, fractured vertebrae in her neck and in her low back per  family. 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 After hospital stay, pt discharged to [facility] for ~1 month, experienced severe UTI as well as bilateral DVT (IVC filter placed). Discharged to [facility], stayed for about a year, and then moved to [facility] in April 2021. Was receiving PT, but per family facility reported that did not have adequate equipment to maximize PT for her. Pt until about 1 month ago was practicing sit to stand transfers with 1-2 people, and working on static standing. Has a brace for L foot due to PF. Pt currently needs assistance with all ADLs (able to assist with donning/doffing her shirt), bed baths, and needs a hoyer lift for transfers. Able to drive power wheelchair without assistance.    Currently in Pain? No/denies                 TREATMENT   Ther-ex Hoyer lift from power wc <>mat; Supine hip abduction x 10BLE, with manual resistance;  Supine hip adduction x 10BLE, with manual resistance; Supine SLR with assistx 10 BLE; Supine quad and glut sets 3s hold x 10 BLE; Hooklying heel slides with resisted extensionx 10 bilateral lower extremity; Hooklying marchingx 10BLE, assist required forBLE; Hooklying SAQ over bolster with light manual resistance for LLE, assist for RLE to get to end range extension x 10BLE, muscle tapping at R quad to encourage improved muscle activation;  Hooklying bridges over bolster under knees3s holdx20; Penguins (oblique lateral crunch) x 10 each side; Partial crunches with assist from therapist 2-3s hold x 10; Seated LAQ with assist for RLE x 10 BLE; Seated marching alternating LE x 10 each; Seated marching with alternating contralateral UE lifts x 10 each; Seated anterior/posterior pelvic tilts 2s hold each direction x 10 each direction;   Therapeutic Activity Rolling left and right with mod assistx1and therapist providing cues for proper sequencing x 5 toward each side;   Neuromuscular  Re-education  Seated balance incorporating dynamic reaching to targets provided by therapist in a variety of directions including cross body;   Pt educated throughout session about proper posture and technique with exercises. Improved exercise technique, movement at target joints, use of target muscles after min to mod verbal, visual, tactile cues.   Patient tolerated unsupported sitting for prolonged period of time with occasional min-modA for back support to avoid falling posteriorly due to decreased core stability and balance deficits. Patient continues to show difficulty with dynamic unsupported seated exercises with BUE horizontal adduction as evidenced by LOB with reaching across. Patient demonstrates min-modA to roll on L and R side with proper knee and hip positioning provided by therapist. Will continue to focus on improving strength, balance, and functional mobility to improve patient's quality of life.                    PT Short Term Goals - 12/10/19 1531      PT SHORT TERM GOAL #1   Title The patient will perform initial HEP with minimum assistance in order to improve strength and function.    Time 4    Period Weeks    Status On-going    Target Date 12/03/19             PT Long Term Goals - 12/10/19 1534      PT LONG TERM GOAL #1   Title The patient will be compliant with finalized HEP with minimum assistance in preparation for self management and maintenance of condition.    Time 12    Period Weeks    Status On-going    Target Date 01/14/20      PT LONG TERM GOAL #2   Title The patient will demonstrate at least 10 point improvement on FOTO score indicating an improved ability to perform functional activities.    Baseline on eval (/720) score 12; 10/22/19: 12; 12/10/19: 18    Time 12    Period Weeks    Status On-going    Target Date 01/14/20      PT LONG TERM GOAL #3   Title The patient will demonstrate lateral scooting  for assistance with  transfers with minA to maximize independence.    Baseline 10/22/19: Pt requires modA+1 for lateral scooting; 12/10/19: modA+1    Time 8    Period Weeks    Status On-going    Target Date 01/14/20      PT LONG TERM GOAL #4   Title The patient will demonstrate a squat pivot transfer with minimum assistance to maximize independence and mobility.    Baseline 10/22/19: unable to safely attempt at this time; 12/10/19: unable to safely attempt at this time    Time 12    Period Weeks    Status On-going    Target Date 01/14/20      PT LONG TERM GOAL #5   Title The patient will demonstrate sitting without UE support for 2-5  minutes at EOB to improve participation and maximize independence with ADLs.    Baseline 10/22/19: Pt able ot sit unsupported for at least 2 minutes at edge of bed    Time 12    Period Weeks    Status Achieved                 Plan - 12/12/19 1357    Clinical Impression Statement Patient tolerated unsupported sitting for prolonged period of time with occasional min-modA for back support to avoid falling posteriorly due to decreased core stability and balance deficits. Patient continues to show difficulty with dynamic unsupported seated exercises with BUE horizontal adduction as evidenced by LOB with reaching across. Patient demonstrates min-modA to roll on L and R side with proper knee and hip positioning provided by therapist. Will continue to focus on improving strength, balance, and functional mobility to improve patient's quality of life.    Personal Factors and Comorbidities Age;Time since onset of injury/illness/exacerbation;Comorbidity 3+;Fitness    Comorbidities HTN, quadriparesis, history of DVT, neurogenic bladder    Examination-Activity Limitations Bathing;Hygiene/Grooming;Squat;Bed Mobility;Lift;Bend;Stand;Engineer, manufacturing;Toileting;Self Feeding;Transfers;Continence;Sit;Dressing;Sleep;Carry    Examination-Participation Restrictions  Church;Laundry;Cleaning;Medication Management;Community Activity;Meal Prep;Interpersonal Relationship    Stability/Clinical Decision Making Evolving/Moderate complexity    Rehab Potential Fair    PT Frequency 2x / week    PT Duration 12 weeks    PT Treatment/Interventions ADLs/Self Care Home Management;Electrical Stimulation;Therapeutic activities;Wheelchair mobility training;Vasopneumatic Device;Joint Manipulations;Vestibular;Passive range of motion;Patient/family education;Therapeutic exercise;DME Instruction;Biofeedback;Aquatic Therapy;Moist Heat;Gait training;Balance training;Orthotic Fit/Training;Dry needling;Energy conservation;Taping;Splinting;Neuromuscular re-education;Cryotherapy;Ultrasound;Functional mobility training    PT Next Visit Plan platform attachments for RW to assess standing? transfers, sitting balance/endurance    PT Home Exercise Plan sitting balance unsupported hold 30-45sec with aide    Consulted and Agree with Plan of Care Patient    Family Member [place]           Patient will benefit from skilled therapeutic intervention in order to improve the following deficits and impairments:  Abnormal gait, Decreased balance, Decreased endurance, Decreased mobility, Difficulty walking, Hypomobility, Impaired sensation, Decreased range of motion, Improper body mechanics, Impaired perceived functional ability, Decreased activity tolerance, Decreased knowledge of use of DME, Decreased safety awareness, Decreased strength, Impaired flexibility, Impaired UE functional use, Postural dysfunction  Visit Diagnosis: Muscle weakness (generalized)  Other lack of coordination     Problem List Patient Active Problem List   Diagnosis Date Noted  . Acute appendicitis with appendiceal abscess 05/30/2018  . Hypocalcemia 02/15/2018  . Dysuria 02/15/2018  . Bilateral lower extremity edema 02/11/2018  . Vaginal yeast infection 01/30/2018  . Chest tightness 01/27/2018  .  Healthcare-associated pneumonia 01/25/2018  . Pleural effusion, not elsewhere classified 01/25/2018  . Acute deep vein thrombosis (DVT) of left lower extremity ([place]) 01/24/2018  . Acute deep vein thrombosis (DVT) of right lower extremity ([place]) 01/24/2018  . Chronic allergic rhinitis 01/24/2018  . Depression with anxiety 01/24/2018  . UTI due to Klebsiella species 01/24/2018  . Protein-calorie malnutrition, severe ([place]) 01/24/2018  . S/P insertion of IVC (inferior vena caval) filter 01/24/2018  . Reactive depression   . Hypokalemia   . Leukocytosis   . Essential hypertension   . Trauma   . 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([place])   . Neuropathic pain   . Neurogenic bowel   . Neurogenic bladder   . Benign essential HTN   . Acute blood loss anemia   . Central cord syndrome at C6 level of cervical spinal cord ([place]) 11/29/2017  . Central cord syndrome [facility]) 11/29/2017   Corene Cornea  D Amarissa Koerner PT, DPT, GCS  Jacquel Mccamish 12/12/2019, 4:06 PM  [place] MAIN [facility] SERVICES [address] [place], [place], [address] Phone: [phone]   Fax:  [phone]  Name: Sherry [name] MRN: [id number] Date of Birth: 15-Aug-1934

## 2019-12-17 ENCOUNTER — Other Ambulatory Visit: Payer: Self-pay

## 2019-12-17 ENCOUNTER — Ambulatory Visit: Payer: Medicare HMO | Admitting: Occupational Therapy

## 2019-12-17 ENCOUNTER — Encounter: Payer: Self-pay | Admitting: Occupational Therapy

## 2019-12-17 DIAGNOSIS — M6281 Muscle weakness (generalized): Secondary | ICD-10-CM

## 2019-12-17 DIAGNOSIS — R278 Other lack of coordination: Secondary | ICD-10-CM

## 2019-12-17 NOTE — Therapy (Signed)
[place] MAIN [facility] SERVICES [address] [place], [place], [address] Phone: [phone]   Fax:  [phone]  Occupational Therapy Treatment  Patient Details  Name: Sherry [name] MRN: [id number] Date of Birth: [birth year] No data recorded  Encounter Date: 12/17/2019   OT End of Session - 12/17/19 1547    Visit Number 16    Number of Visits 24    Date for OT Re-Evaluation 02/27/20    Authorization Time Period Progress report period starting 09/17/19    OT Start Time 1430    OT Stop Time 1515    OT Time Calculation (min) 45 min    Equipment Utilized During Treatment moist heat    Activity Tolerance Patient tolerated treatment well    Behavior During Therapy [facility] for tasks assessed/performed           Past Medical History:  Diagnosis Date  . Acute blood loss anemia   . Arthritis   . Cancer ([place])    skin  . Central cord syndrome at C6 level of cervical spinal cord ([place]) 11/29/2017  . Hypertension   . Protein-calorie malnutrition, severe ([place]) 01/24/2018  . S/P insertion of IVC (inferior vena caval) filter 01/24/2018  . 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[facility])     Past Surgical History:  Procedure Laterality Date  . ANTERIOR CERVICAL DECOMP/DISCECTOMY FUSION N/A 11/29/2017   Procedure: Cervical five-six, six-seven Anterior Cervical Decompression Fusion;  Surgeon: Judith Part, MD;  Location: [place];  Service: Neurosurgery;  Laterality: N/A;  Cervical five-six, six-seven Anterior Cervical Decompression Fusion  . CATARACT EXTRACTION    . IR IVC FILTER PLMT / S&I /IMG GUID/MOD SED  12/08/2017    There were no vitals filed for this visit.   Subjective Assessment - 12/17/19 1547    Subjective  Pt. reports that she is preparing to move into the new healthcare building at [facility] this week.    Pertinent History Patient s/p fall November 29, 2017 resulting in diagnosis of central cord syndrome at C 6 level.  She has had therapy in multiple venues  but with recent move to [facility], therapy staff recommended she seek outpatient therapy for her needs.    Patient Stated Goals Patient would like to be able to move better in bed, stand and perform self care tasks.    Currently in Pain? No/denies          OT Treatment  Pt. Tolerated bilateral shoulder flexion, extension, abduction, elbow flexion, extension forearm supination AAROM/AROM. PROM bilateral wrist extension, digit MP, PIP, and DIP flexion, and extension in conjunction with moist heat.  Pt. continues to engage her right hand when performing self-feeding with adpative equipment. Pt. reports that she is able to use her right thumb to hold items. Pt. tolerated ROM well without reports of pain, or discomfort. Pt. continues to benefit from working on improving ROM in order to work towards increasing engagement of her bilateral hands during ADLs, and IADL tasks.                          OT Education - 12/17/19 1547    Education Details UE ROM, writing    Person(s) Educated Patient;Caregiver(s)    Methods Explanation;Demonstration    Comprehension Verbalized understanding;Need further instruction               OT Long Term Goals - 12/05/19 1511      OT LONG TERM GOAL #1  Title Patient and caregiver will demonstrate understanding of home exercise program for ROM.    Baseline Pt. has a restorative aide rehab aide assist her wih exercises at [facility]    Time 12    Period Weeks    Status On-going    Target Date 02/27/20      OT LONG TERM GOAL #2   Title Patient will demonstrate ability to don shirt with min assist from seated position.    Baseline indepedent with a larger shirt. Requires increased time to complete    Time 12    Period Weeks    Target Date 02/27/20      OT LONG TERM GOAL #3   Title Patient will demonstrate improved sitting balance at the edge of the bed to participate in self care tasks.    Baseline Pt. has an air mattress at  [facility]    Time 12    Period Weeks    Status Deferred      OT LONG TERM GOAL #4   Title Patient will demonstrate improved composite finger flexion to hold deodorant to apply to underarms.    Baseline Pt with improving finger flexion of R hand to use partial cylindrical grasp, but unable to grasp and hold items. Very limited digit flexion of L hand.    Time 12    Period Weeks    Status On-going    Target Date 02/27/20      OT LONG TERM GOAL #5   Title Pt will complete self feeding with setup, use of universal cuff and minimal spillage.    Baseline inconsistent with feeding and increased spillage.    Time 12    Period Weeks    Status Achieved      Long Term Additional Goals   Additional Long Term Goals Yes      OT LONG TERM GOAL #6   Title Patient will increase right UE ROM to comb the back of her hair with modified independence.    Baseline Pt. is able to comb the front, and sides of her head. Pt. can now reach the back, however cannot see what she is doing in the back of her head.    Time 12    Period Weeks    Status Partially Met    Target Date 02/27/20      OT LONG TERM GOAL #8   Title Pt. will write, and sign her name with 100% legibility, and modified independence.    Baseline Pt. has difficulty holding, and manipulating a pen for writing.    Time 12    Period Weeks    Status New    Target Date 02/27/20                 Plan - 12/17/19 1548    Clinical Impression Statement Pt. continues to engage her right hand when performing self-feeding with adpative equipment. Pt. reports that she is able to use her right thumb to hold items. Pt. tolerated ROM well without reports of pain, or discomfort. Pt. continues to benefit from working on improving ROM in order to work towards increasing engagement of her bilateral hands during ADLs, and IADL tasks.   OT Occupational Profile and History Detailed Assessment- Review of Records and additional review of physical,  cognitive, psychosocial history related to current functional performance    Occupational performance deficits (Please refer to evaluation for details): ADL's;IADL's;Leisure;Social Participation    Body Structure / Function / Physical Skills ADL;Continence;Dexterity;Flexibility;Strength;ROM;Balance;Coordination;FMC;IADL;Endurance;UE  functional use;Decreased knowledge of use of [facility]    Psychosocial Skills Environmental  Adaptations;Habits;Routines and Behaviors    Rehab Potential Fair    Clinical Decision Making Limited treatment options, no task modification necessary    Comorbidities Affecting Occupational Performance: Presence of comorbidities impacting occupational performance    Comorbidities impacting occupational performance description: contractures of bilateral hands, dependent transfers,    Modification or Assistance to Complete Evaluation  No modification of tasks or assist necessary to complete eval    OT Frequency 2x / week    OT Duration 12 weeks    OT Treatment/Interventions Self-care/ADL training;Cryotherapy;Therapeutic exercise;DME and/or AE instruction;Balance training;Neuromuscular education;Manual Therapy;Splinting;Moist Heat;Passive range of motion;Therapeutic activities;Patient/family education    Consulted and Agree with Plan of Care Patient           Patient will benefit from skilled therapeutic intervention in order to improve the following deficits and impairments:   Body Structure / Function / Physical Skills: ADL, Continence, Dexterity, Flexibility, Strength, ROM, Balance, Coordination, FMC, IADL, Endurance, UE functional use, Decreased knowledge of use of DME, GMC   Psychosocial Skills: Environmental  Adaptations, Habits, Routines and Behaviors   Visit Diagnosis: Muscle weakness (generalized)  Other lack of coordination    Problem List Patient Active Problem List   Diagnosis Date Noted  . Acute appendicitis with appendiceal abscess 05/30/2018  .  Hypocalcemia 02/15/2018  . Dysuria 02/15/2018  . Bilateral lower extremity edema 02/11/2018  . Vaginal yeast infection 01/30/2018  . Chest tightness 01/27/2018  . Healthcare-associated pneumonia 01/25/2018  . Pleural effusion, not elsewhere classified 01/25/2018  . Acute deep vein thrombosis (DVT) of left lower extremity ([place]) 01/24/2018  . Acute deep vein thrombosis (DVT) of right lower extremity ([place]) 01/24/2018  . Chronic allergic rhinitis 01/24/2018  . Depression with anxiety 01/24/2018  . UTI due to Klebsiella species 01/24/2018  . Protein-calorie malnutrition, severe ([place]) 01/24/2018  . S/P insertion of IVC (inferior vena caval) filter 01/24/2018  . Reactive depression   . Hypokalemia   . Leukocytosis   . Essential hypertension   . Trauma   . 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([place])   . Neuropathic pain   . Neurogenic bowel   . Neurogenic bladder   . Benign essential HTN   . Acute blood loss anemia   . Central cord syndrome at C6 level of cervical spinal cord ([place]) 11/29/2017  . Central cord syndrome [facility]) 11/29/2017    Harrel Carina, MS, OTR/L 12/17/2019, 3:51 PM  [place] MAIN [facility] SERVICES [address] [place], [place], [address] Phone: [phone]   Fax:  [phone]  Name: LORENIA HOSTON MRN: [id number] Date of Birth: [birth year]

## 2019-12-19 ENCOUNTER — Ambulatory Visit: Payer: Medicare HMO

## 2019-12-25 ENCOUNTER — Ambulatory Visit: Payer: Medicare HMO

## 2019-12-25 ENCOUNTER — Ambulatory Visit: Payer: Medicare HMO | Admitting: Occupational Therapy

## 2019-12-25 ENCOUNTER — Other Ambulatory Visit: Payer: Self-pay

## 2019-12-25 DIAGNOSIS — R278 Other lack of coordination: Secondary | ICD-10-CM

## 2019-12-25 DIAGNOSIS — R140 Abdominal distension (gaseous): Secondary | ICD-10-CM

## 2019-12-25 DIAGNOSIS — S14129S Central cord syndrome at unspecified level of cervical spinal cord, sequela: Secondary | ICD-10-CM

## 2019-12-25 DIAGNOSIS — M6281 Muscle weakness (generalized): Secondary | ICD-10-CM

## 2019-12-25 DIAGNOSIS — O2230 Deep phlebothrombosis in pregnancy, unspecified trimester: Secondary | ICD-10-CM

## 2019-12-25 DIAGNOSIS — R2689 Other abnormalities of gait and mobility: Secondary | ICD-10-CM

## 2019-12-25 NOTE — Therapy (Signed)
[place] MAIN [facility] SERVICES [address] [place], [place], [address] Phone: [phone]   Fax:  [phone]  Physical Therapy Treatment  Patient Details  Name: Sherry [name] MRN: [id number] Date of Birth: December 18, 1934 Referring Provider (PT): Viviana Simpler   Encounter Date: 12/25/2019   PT End of Session - 12/25/19 1439    Visit Number 22    Number of Visits 25    Date for PT Re-Evaluation 01/14/20    Authorization Type aetna medicare FOTO performed by PT on eval (7/20), score 12    PT Start Time 1346    PT Stop Time 1430    PT Time Calculation (min) 44 min    Equipment Utilized During Treatment Other (comment)   lift   Activity Tolerance Patient tolerated treatment well;No increased pain    Behavior During Therapy WFL for tasks assessed/performed           Past Medical History:  Diagnosis Date  . Acute blood loss anemia   . Arthritis   . Cancer ([place])    skin  . Central cord syndrome at C6 level of cervical spinal cord ([place]) 11/29/2017  . Hypertension   . Protein-calorie malnutrition, severe ([place]) 01/24/2018  . S/P insertion of IVC (inferior vena caval) filter 01/24/2018  . 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[facility])     Past Surgical History:  Procedure Laterality Date  . ANTERIOR CERVICAL DECOMP/DISCECTOMY FUSION N/A 11/29/2017   Procedure: Cervical five-six, six-seven Anterior Cervical Decompression Fusion;  Surgeon: Judith Part, MD;  Location: [place];  Service: Neurosurgery;  Laterality: N/A;  Cervical five-six, six-seven Anterior Cervical Decompression Fusion  . CATARACT EXTRACTION    . IR IVC FILTER PLMT / S&I /IMG GUID/MOD SED  12/08/2017    There were no vitals filed for this visit.   Subjective Assessment - 12/25/19 1438    Subjective Pt doing well today, reports no changes to medications, medical status. Pt denies any pain or recent MD visits. Pt reports she has moved into the new building at [facility], that it is very nice.     Pertinent History Pt is an 85 yo female that fell in 2019, fractured vertebrae in her neck and in her low back per family. 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 After hospital stay, pt discharged to [facility] for ~1 month, experienced severe UTI as well as bilateral DVT (IVC filter placed). Discharged to [facility], stayed for about a year, and then moved to [facility] in April 2021. Was receiving PT, but per family facility reported that did not have adequate equipment to maximize PT for her. Pt until about 1 month ago was practicing sit to stand transfers with 1-2 people, and working on static standing. Has a brace for L foot due to PF. Pt currently needs assistance with all ADLs (able to assist with donning/doffing her shirt), bed baths, and needs a hoyer lift for transfers. Able to drive power wheelchair without assistance.    Currently in Pain? No/denies            Intervention this date: Hoyer lift from power wc <> mat; -SAQ 2x10 bilat  -Marching -> resisted leg press 2x10 bilat  -Manually resisted clam 2x10 bilat  -Hip adductor stretch 2x60sec bilat -Hip abdct 2x10   MaxA to EOB, siting independent x 5 minutes  -[place]en physioball rotation c trunk -Greenphysioball overhead lift x10    Pt educated throughout session about proper posture and technique with exercises. Improved exercise  technique, movement at target      PT Short Term Goals - 12/10/19 1531      PT SHORT TERM GOAL #1   Title The patient will perform initial HEP with minimum assistance in order to improve strength and function.    Time 4    Period Weeks    Status On-going    Target Date 12/03/19             PT Long Term Goals - 12/10/19 1534      PT LONG TERM GOAL #1   Title The patient will be compliant with finalized HEP with minimum assistance in preparation for self management and maintenance of condition.    Time 12    Period Weeks    Status On-going    Target Date 01/14/20       PT LONG TERM GOAL #2   Title The patient will demonstrate at least 10 point improvement on FOTO score indicating an improved ability to perform functional activities.    Baseline on eval (/720) score 12; 10/22/19: 12; 12/10/19: 18    Time 12    Period Weeks    Status On-going    Target Date 01/14/20      PT LONG TERM GOAL #3   Title The patient will demonstrate lateral scooting  for assistance with transfers with minA to maximize independence.    Baseline 10/22/19: Pt requires modA+1 for lateral scooting; 12/10/19: modA+1    Time 8    Period Weeks    Status On-going    Target Date 01/14/20      PT LONG TERM GOAL #4   Title The patient will demonstrate a squat pivot transfer with minimum assistance to maximize independence and mobility.    Baseline 10/22/19: unable to safely attempt at this time; 12/10/19: unable to safely attempt at this time    Time 12    Period Weeks    Status On-going    Target Date 01/14/20      PT LONG TERM GOAL #5   Title The patient will demonstrate sitting without UE support for 2-5 minutes at EOB to improve participation and maximize independence with ADLs.    Baseline 10/22/19: Pt able ot sit unsupported for at least 2 minutes at edge of bed    Time 12    Period Weeks    Status Achieved                 Plan - 12/25/19 1440    Clinical Impression Statement Good tolerance to session this date, continues to have improved strenght on Left > Right. Pt has difficulty with acitve ABDCT of hips gravity reduced, improved with aductor stretching. Pt conitnues to improve in strength overall. Good trunk control seated at EOB for ~10 minutes.    Personal Factors and Comorbidities Age;Time since onset of injury/illness/exacerbation;Comorbidity 3+;Fitness    Comorbidities HTN, quadriparesis, history of DVT, neurogenic bladder    Examination-Activity Limitations Bathing;Hygiene/Grooming;Squat;Bed Mobility;Lift;Bend;Stand;Futures trader;Toileting;Self Feeding;Transfers;Continence;Sit;Dressing;Sleep;Carry    Examination-Participation Restrictions Church;Laundry;Cleaning;Medication Management;Community Activity;Meal Prep;Interpersonal Relationship    Stability/Clinical Decision Making Evolving/Moderate complexity    Clinical Decision Making Moderate    Rehab Potential Fair    PT Frequency 2x / week    PT Duration 12 weeks    PT Treatment/Interventions ADLs/Self Care Home Management;Electrical Stimulation;Therapeutic activities;Wheelchair mobility training;Vasopneumatic Device;Joint Manipulations;Vestibular;Passive range of motion;Patient/family education;Therapeutic exercise;DME Instruction;Biofeedback;Aquatic Therapy;Moist Heat;Gait training;Balance training;Orthotic Fit/Training;Dry needling;Energy conservation;Taping;Splinting;Neuromuscular re-education;Cryotherapy;Ultrasound;Functional mobility training    PT Next Visit Plan platform attachments  for RW to assess standing? transfers, sitting balance/endurance    PT Home Exercise Plan sitting balance unsupported hold 30-45sec with aide    Consulted and Agree with Plan of Care Patient    Family Member [place]           Patient will benefit from skilled therapeutic intervention in order to improve the following deficits and impairments:  Abnormal gait, Decreased balance, Decreased endurance, Decreased mobility, Difficulty walking, Hypomobility, Impaired sensation, Decreased range of motion, Improper body mechanics, Impaired perceived functional ability, Decreased activity tolerance, Decreased knowledge of use of DME, Decreased safety awareness, Decreased strength, Impaired flexibility, Impaired UE functional use, Postural dysfunction  Visit Diagnosis: Muscle weakness (generalized)  Other lack of coordination  Central cord syndrome, sequela (HCC)  Other abnormalities of gait and mobility  Abdominal distention  DVT (deep vein thrombosis) in  pregnancy     Problem List Patient Active Problem List   Diagnosis Date Noted  . Acute appendicitis with appendiceal abscess 05/30/2018  . Hypocalcemia 02/15/2018  . Dysuria 02/15/2018  . Bilateral lower extremity edema 02/11/2018  . Vaginal yeast infection 01/30/2018  . Chest tightness 01/27/2018  . Healthcare-associated pneumonia 01/25/2018  . Pleural effusion, not elsewhere classified 01/25/2018  . Acute deep vein thrombosis (DVT) of left lower extremity ([place]) 01/24/2018  . Acute deep vein thrombosis (DVT) of right lower extremity ([place]) 01/24/2018  . Chronic allergic rhinitis 01/24/2018  . Depression with anxiety 01/24/2018  . UTI due to Klebsiella species 01/24/2018  . Protein-calorie malnutrition, severe ([place]) 01/24/2018  . S/P insertion of IVC (inferior vena caval) filter 01/24/2018  . Reactive depression   . Hypokalemia   . Leukocytosis   . Essential hypertension   . Trauma   . 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([place])   . Neuropathic pain   . Neurogenic bowel   . Neurogenic bladder   . Benign essential HTN   . Acute blood loss anemia   . Central cord syndrome at C6 level of cervical spinal cord ([place]) 11/29/2017  . Central cord syndrome [facility]) 11/29/2017   2:44 PM, 12/25/19 Etta Grandchild, PT, DPT Physical Therapist - [place] [phone]     Etta Grandchild 12/25/2019, 2:42 PM  [place] MAIN [facility] SERVICES [address] [place], [place], [address] Phone: [phone]   Fax:  [phone]  Name: Sherry [name] MRN: [id number] Date of Birth: 1934-09-09

## 2019-12-26 ENCOUNTER — Encounter: Payer: Self-pay | Admitting: Occupational Therapy

## 2019-12-26 NOTE — Therapy (Signed)
[place] MAIN [facility] SERVICES [address] [place], [place], [address] Phone: [phone]   Fax:  [phone]  Occupational Therapy Treatment  Patient Details  Name: Sherry [name] MRN: [id number] Date of Birth: 1934/11/13 No data recorded  Encounter Date: 12/25/2019   OT End of Session - 12/26/19 0912    Visit Number 17    Number of Visits 24    Date for OT Re-Evaluation 02/27/20    Authorization Time Period Progress report period starting 09/17/19    OT Start Time 1300    OT Stop Time 1345    OT Time Calculation (min) 45 min    Activity Tolerance Patient tolerated treatment well    Behavior During Therapy [facility] for tasks assessed/performed           Past Medical History:  Diagnosis Date  . Acute blood loss anemia   . Arthritis   . Cancer ([place])    skin  . Central cord syndrome at C6 level of cervical spinal cord ([place]) 11/29/2017  . Hypertension   . Protein-calorie malnutrition, severe ([place]) 01/24/2018  . S/P insertion of IVC (inferior vena caval) filter 01/24/2018  . 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[facility])     Past Surgical History:  Procedure Laterality Date  . ANTERIOR CERVICAL DECOMP/DISCECTOMY FUSION N/A 11/29/2017   Procedure: Cervical five-six, six-seven Anterior Cervical Decompression Fusion;  Surgeon: Judith Part, MD;  Location: [place];  Service: Neurosurgery;  Laterality: N/A;  Cervical five-six, six-seven Anterior Cervical Decompression Fusion  . CATARACT EXTRACTION    . IR IVC FILTER PLMT / S&I /IMG GUID/MOD SED  12/08/2017    There were no vitals filed for this visit.   Subjective Assessment - 12/26/19 0911    Subjective  Pt. reports that she is preparing to move into the new healthcare building at [facility] last week, and her husband moved in yesterday.    Pertinent History Patient s/p fall November 29, 2017 resulting in diagnosis of central cord syndrome at C 6 level.  She has had therapy in multiple venues but with recent  move to [facility], therapy staff recommended she seek outpatient therapy for her needs.    Patient Stated Goals Patient would like to be able to move better in bed, stand and perform self care tasks.    Currently in Pain? No/denies          OT Treatment  Pt. tolerated bilateral shoulder flexion, extension, abduction, elbow flexion, extension forearm supination AAROM/AROM. PROM bilateral wrist extension, digit MP, PIP, and DIP flexion, and extension in conjunction with moist heat.  Pt.continues to engageher right hand whenperforming self-feeding with adpative equipment. Pt. is able to hold a specific wide width pen in her right hand, and formulate letters when positioned at the tabletop. Pt. reports that she is able to use her right thumb to hold items. Pt. tolerated ROM well without reports of pain, or discomfort. Pt. continues to benefit from working on improving ROM in order to work towards increasing engagement of her bilateral hands during ADLs, and IADL tasks.                        OT Education - 12/26/19 0912    Education Details UE ROM    Person(s) Educated Patient;Caregiver(s)    Methods Explanation;Demonstration    Comprehension Verbalized understanding;Need further instruction               OT Long Term Goals -  12/05/19 1511      OT LONG TERM GOAL #1   Title Patient and caregiver will demonstrate understanding of home exercise program for ROM.    Baseline Pt. has a restorative aide rehab aide assist her wih exercises at [facility]    Time 12    Period Weeks    Status On-going    Target Date 02/27/20      OT LONG TERM GOAL #2   Title Patient will demonstrate ability to don shirt with min assist from seated position.    Baseline indepedent with a larger shirt. Requires increased time to complete    Time 12    Period Weeks    Target Date 02/27/20      OT LONG TERM GOAL #3   Title Patient will demonstrate improved sitting balance at the  edge of the bed to participate in self care tasks.    Baseline Pt. has an air mattress at [facility]    Time 12    Period Weeks    Status Deferred      OT LONG TERM GOAL #4   Title Patient will demonstrate improved composite finger flexion to hold deodorant to apply to underarms.    Baseline Pt with improving finger flexion of R hand to use partial cylindrical grasp, but unable to grasp and hold items. Very limited digit flexion of L hand.    Time 12    Period Weeks    Status On-going    Target Date 02/27/20      OT LONG TERM GOAL #5   Title Pt will complete self feeding with setup, use of universal cuff and minimal spillage.    Baseline inconsistent with feeding and increased spillage.    Time 12    Period Weeks    Status Achieved      Long Term Additional Goals   Additional Long Term Goals Yes      OT LONG TERM GOAL #6   Title Patient will increase right UE ROM to comb the back of her hair with modified independence.    Baseline Pt. is able to comb the front, and sides of her head. Pt. can now reach the back, however cannot see what she is doing in the back of her head.    Time 12    Period Weeks    Status Partially Met    Target Date 02/27/20      OT LONG TERM GOAL #8   Title Pt. will write, and sign her name with 100% legibility, and modified independence.    Baseline Pt. has difficulty holding, and manipulating a pen for writing.    Time 12    Period Weeks    Status New    Target Date 02/27/20                 Plan - 12/26/19 0913    Clinical Impression Statement Pt.continues to engageher right hand whenperforming self-feeding with adpative equipment. Pt. is able to hold a specific wide width pen in her right hand, and formulate letters when positioned at the tabletop. Pt. reports that she is able to use her right thumb to hold items. Pt. tolerated ROM well without reports of pain, or discomfort. Pt. continues to benefit from working on improving ROM in order  to work towards increasing engagement of her bilateral hands during ADLs, and IADL tasks.   OT Occupational Profile and History Detailed Assessment- Review of Records and additional review of physical,  cognitive, psychosocial history related to current functional performance    Occupational performance deficits (Please refer to evaluation for details): ADL's;IADL's;Leisure;Social Participation    Body Structure / Function / Physical Skills ADL;Continence;Dexterity;Flexibility;Strength;ROM;Balance;Coordination;FMC;IADL;Endurance;UE functional use;Decreased knowledge of use of DME;GMC    Psychosocial Skills Environmental  Adaptations;Habits;Routines and Behaviors    Rehab Potential Fair    Clinical Decision Making Limited treatment options, no task modification necessary    Comorbidities Affecting Occupational Performance: Presence of comorbidities impacting occupational performance    Comorbidities impacting occupational performance description: contractures of bilateral hands, dependent transfers,    OT Frequency 2x / week    OT Duration 12 weeks    OT Treatment/Interventions Self-care/ADL training;Cryotherapy;Therapeutic exercise;DME and/or AE instruction;Balance training;Neuromuscular education;Manual Therapy;Splinting;Moist Heat;Passive range of motion;Therapeutic activities;Patient/family education    Consulted and Agree with Plan of Care Patient           Patient will benefit from skilled therapeutic intervention in order to improve the following deficits and impairments:   Body Structure / Function / Physical Skills: ADL, Continence, Dexterity, Flexibility, Strength, ROM, Balance, Coordination, FMC, IADL, Endurance, UE functional use, Decreased knowledge of use of DME, GMC   Psychosocial Skills: Environmental  Adaptations, Habits, Routines and Behaviors   Visit Diagnosis: Muscle weakness (generalized)  Other lack of coordination    Problem List Patient Active Problem List    Diagnosis Date Noted  . Acute appendicitis with appendiceal abscess 05/30/2018  . Hypocalcemia 02/15/2018  . Dysuria 02/15/2018  . Bilateral lower extremity edema 02/11/2018  . Vaginal yeast infection 01/30/2018  . Chest tightness 01/27/2018  . Healthcare-associated pneumonia 01/25/2018  . Pleural effusion, not elsewhere classified 01/25/2018  . Acute deep vein thrombosis (DVT) of left lower extremity ([employer]) 01/24/2018  . Acute deep vein thrombosis (DVT) of right lower extremity ([place]) 01/24/2018  . Chronic allergic rhinitis 01/24/2018  . Depression with anxiety 01/24/2018  . UTI due to Klebsiella species 01/24/2018  . Protein-calorie malnutrition, severe ([place]) 01/24/2018  . S/P insertion of IVC (inferior vena caval) filter 01/24/2018  . Reactive depression   . Hypokalemia   . Leukocytosis   . Essential hypertension   . Trauma   . 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([place])   . Neuropathic pain   . Neurogenic bowel   . Neurogenic bladder   . Benign essential HTN   . Acute blood loss anemia   . Central cord syndrome at C6 level of cervical spinal cord ([place]) 11/29/2017  . Central cord syndrome [facility]) 11/29/2017    Harrel Carina, MS, OTR/L 12/26/2019, 9:15 AM  [place] MAIN [facility] SERVICES [address] [place], [place], [address] Phone: [phone]   Fax:  [phone]  Name: Sherry [name] MRN: [id number] Date of Birth: [birth year]

## 2020-01-01 ENCOUNTER — Ambulatory Visit: Payer: Medicare HMO | Admitting: Physical Therapy

## 2020-01-01 ENCOUNTER — Other Ambulatory Visit: Payer: Self-pay

## 2020-01-01 ENCOUNTER — Ambulatory Visit: Payer: Medicare HMO | Admitting: Occupational Therapy

## 2020-01-01 ENCOUNTER — Encounter: Payer: Self-pay | Admitting: Occupational Therapy

## 2020-01-01 DIAGNOSIS — M6281 Muscle weakness (generalized): Secondary | ICD-10-CM | POA: Diagnosis not present

## 2020-01-01 DIAGNOSIS — R2689 Other abnormalities of gait and mobility: Secondary | ICD-10-CM

## 2020-01-01 DIAGNOSIS — R278 Other lack of coordination: Secondary | ICD-10-CM

## 2020-01-01 NOTE — Therapy (Signed)
[place] MAIN [facility] SERVICES [address] [place], [place], [address] Phone: [phone]   Fax:  [phone]  Physical Therapy Treatment  Patient Details  Name: Sherry [name] MRN: [id number] Date of Birth: 1934/02/18 Referring Provider (PT): Viviana Simpler   Encounter Date: 01/01/2020   PT End of Session - 01/01/20 1316    Visit Number 23    Number of Visits 25    Date for PT Re-Evaluation 01/14/20    Authorization Type aetna medicare FOTO performed by PT on eval (7/20), score 12    PT Start Time 1350    PT Stop Time 1435    PT Time Calculation (min) 45 min    Equipment Utilized During Treatment Other (comment)   lift   Activity Tolerance Patient tolerated treatment well;No increased pain    Behavior During Therapy WFL for tasks assessed/performed           Past Medical History:  Diagnosis Date  . Acute blood loss anemia   . Arthritis   . Cancer ([place])    skin  . Central cord syndrome at C6 level of cervical spinal cord ([place]) 11/29/2017  . Hypertension   . Protein-calorie malnutrition, severe ([place]) 01/24/2018  . S/P insertion of IVC (inferior vena caval) filter 01/24/2018  . 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[facility])     Past Surgical History:  Procedure Laterality Date  . ANTERIOR CERVICAL DECOMP/DISCECTOMY FUSION N/A 11/29/2017   Procedure: Cervical five-six, six-seven Anterior Cervical Decompression Fusion;  Surgeon: Judith Part, MD;  Location: [place];  Service: Neurosurgery;  Laterality: N/A;  Cervical five-six, six-seven Anterior Cervical Decompression Fusion  . CATARACT EXTRACTION    . IR IVC FILTER PLMT / S&I /IMG GUID/MOD SED  12/08/2017    There were no vitals filed for this visit.   Ther-ex Hoyer lift from power wc <>mat; Supine hip abduction x 20BLE, with manual resistance;  Supine hip adduction x 20BLE, with manual resistance; Supine SLR with assistx 20 BLE; Supine quad and glut sets 3s hold x 20 BLE; Hooklying  heel slides with resisted extensionx 20 bilateral lower extremity; Hooklying marchingx 20BLE, assist required forBLE; Hooklying SAQ over bolster with light manual resistance for LLE, assist for RLE to get to end range extension x 20BLE, muscle tapping at R quad to encourage improved muscle activation;  Hooklying bridges over bolster under knees3s holdx20; Penguins (oblique lateral crunch) x 20 each side; Partial crunches with assist from therapist 2-3s hold x 20;    Therapeutic Activity Rolling left and right with mod assistx1and therapist providing cues for proper sequencing x 5 toward each side; Reaching with BUE in supine in multidirection x 10 reps            PT Short Term Goals - 12/10/19 1531      PT SHORT TERM GOAL #1   Title The patient will perform initial HEP with minimum assistance in order to improve strength and function.    Time 4    Period Weeks    Status On-going    Target Date 12/03/19             PT Long Term Goals - 12/10/19 1534      PT LONG TERM GOAL #1   Title The patient will be compliant with finalized HEP with minimum assistance in preparation for self management and maintenance of condition.    Time 12    Period Weeks    Status On-going    Target Date 01/14/20  PT LONG TERM GOAL #2   Title The patient will demonstrate at least 10 point improvement on FOTO score indicating an improved ability to perform functional activities.    Baseline on eval (/720) score 12; 10/22/19: 12; 12/10/19: 18    Time 12    Period Weeks    Status On-going    Target Date 01/14/20      PT LONG TERM GOAL #3   Title The patient will demonstrate lateral scooting  for assistance with transfers with minA to maximize independence.    Baseline 10/22/19: Pt requires modA+1 for lateral scooting; 12/10/19: modA+1    Time 8    Period Weeks    Status On-going    Target Date 01/14/20      PT LONG TERM GOAL #4   Title The patient will demonstrate a squat pivot  transfer with minimum assistance to maximize independence and mobility.    Baseline 10/22/19: unable to safely attempt at this time; 12/10/19: unable to safely attempt at this time    Time 12    Period Weeks    Status On-going    Target Date 01/14/20      PT LONG TERM GOAL #5   Title The patient will demonstrate sitting without UE support for 2-5 minutes at EOB to improve participation and maximize independence with ADLs.    Baseline 10/22/19: Pt able ot sit unsupported for at least 2 minutes at edge of bed    Time 12    Period Weeks    Status Achieved                 Plan - 01/01/20 1754    Clinical Impression Statement Patient completed reaching exercises with fair tolerance to activity. Therapist provided patient education on importance of using momentum to reach and maintain reaching. Patient completed strength training exercises with minimal to no increase in pain. Will progress patient by increasing repetitions in next therapy session. Patient will benefit from continued PT services in order to improve core strength, BLE strength, and functional activity tolerance to improve patient's quality of life.    Personal Factors and Comorbidities Age;Time since onset of injury/illness/exacerbation;Comorbidity 3+;Fitness    Comorbidities HTN, quadriparesis, history of DVT, neurogenic bladder    Examination-Activity Limitations Bathing;Hygiene/Grooming;Squat;Bed Mobility;Lift;Bend;Stand;Engineer, manufacturing;Toileting;Self Feeding;Transfers;Continence;Sit;Dressing;Sleep;Carry    Examination-Participation Restrictions Church;Laundry;Cleaning;Medication Management;Community Activity;Meal Prep;Interpersonal Relationship    Stability/Clinical Decision Making Evolving/Moderate complexity    Rehab Potential Fair    PT Frequency 2x / week    PT Duration 12 weeks    PT Treatment/Interventions ADLs/Self Care Home Management;Electrical Stimulation;Therapeutic activities;Wheelchair mobility  training;Vasopneumatic Device;Joint Manipulations;Vestibular;Passive range of motion;Patient/family education;Therapeutic exercise;DME Instruction;Biofeedback;Aquatic Therapy;Moist Heat;Gait training;Balance training;Orthotic Fit/Training;Dry needling;Energy conservation;Taping;Splinting;Neuromuscular re-education;Cryotherapy;Ultrasound;Functional mobility training    PT Next Visit Plan sitting balance/endurance    PT Home Exercise Plan sitting balance unsupported hold 30-45sec with aide    Consulted and Agree with Plan of Care Patient    Family Member [place]           Patient will benefit from skilled therapeutic intervention in order to improve the following deficits and impairments:  Abnormal gait, Decreased balance, Decreased endurance, Decreased mobility, Difficulty walking, Hypomobility, Impaired sensation, Decreased range of motion, Improper body mechanics, Impaired perceived functional ability, Decreased activity tolerance, Decreased knowledge of use of DME, Decreased safety awareness, Decreased strength, Impaired flexibility, Impaired UE functional use, Postural dysfunction  Visit Diagnosis: Muscle weakness (generalized)  Other lack of coordination  Other abnormalities of gait and mobility  Problem List Patient Active Problem List   Diagnosis Date Noted  . Acute appendicitis with appendiceal abscess 05/30/2018  . Hypocalcemia 02/15/2018  . Dysuria 02/15/2018  . Bilateral lower extremity edema 02/11/2018  . Vaginal yeast infection 01/30/2018  . Chest tightness 01/27/2018  . Healthcare-associated pneumonia 01/25/2018  . Pleural effusion, not elsewhere classified 01/25/2018  . Acute deep vein thrombosis (DVT) of left lower extremity ([place]) 01/24/2018  . Acute deep vein thrombosis (DVT) of right lower extremity ([place]) 01/24/2018  . Chronic allergic rhinitis 01/24/2018  . Depression with anxiety 01/24/2018  . UTI due to Klebsiella species 01/24/2018  .  Protein-calorie malnutrition, severe ([place]) 01/24/2018  . S/P insertion of IVC (inferior vena caval) filter 01/24/2018  . Reactive depression   . Hypokalemia   . Leukocytosis   . Essential hypertension   . Trauma   . 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([place])   . Neuropathic pain   . Neurogenic bowel   . Neurogenic bladder   . Benign essential HTN   . Acute blood loss anemia   . Central cord syndrome at C6 level of cervical spinal cord ([place]) 11/29/2017  . Central cord syndrome [facility]) 11/29/2017   Karl Luke PT, DPT Netta Corrigan 01/01/2020, 5:56 PM  [place] MAIN [facility] SERVICES [address] [place], [place], [address] Phone: [phone]   Fax:  [phone]  Name: Sherry [name] MRN: [id number] Date of Birth: 10-06-1934

## 2020-01-01 NOTE — Therapy (Addendum)
[place] MAIN [facility] SERVICES [address] [place], [place], [address] Phone: [phone]   Fax:  [phone]  Occupational Therapy Treatment  Patient Details  Name: Sherry [name] MRN: [id number] Date of Birth: [birth year] No data recorded  Encounter Date: 01/01/2020   OT End of Session - 01/01/20 1357    Visit Number 18    Number of Visits 24    Date for OT Re-Evaluation 02/27/20    OT Start Time 1300    OT Stop Time 1345   OT Time Calculation (min) 45 min    Activity Tolerance Patient tolerated treatment well    Behavior During Therapy [facility] for tasks assessed/performed           Past Medical History:  Diagnosis Date  . Acute blood loss anemia   . Arthritis   . Cancer ([place])    skin  . Central cord syndrome at C6 level of cervical spinal cord ([place]) 11/29/2017  . Hypertension   . Protein-calorie malnutrition, severe ([place]) 01/24/2018  . S/P insertion of IVC (inferior vena caval) filter 01/24/2018  . 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[facility])     Past Surgical History:  Procedure Laterality Date  . ANTERIOR CERVICAL DECOMP/DISCECTOMY FUSION N/A 11/29/2017   Procedure: Cervical five-six, six-seven Anterior Cervical Decompression Fusion;  Surgeon: Judith Part, MD;  Location: [place];  Service: Neurosurgery;  Laterality: N/A;  Cervical five-six, six-seven Anterior Cervical Decompression Fusion  . CATARACT EXTRACTION    . IR IVC FILTER PLMT / S&I /IMG GUID/MOD SED  12/08/2017    Subjective Assessment - 01/01/20 1402    Subjective  Pt. reports that the was able to go to her daughter's home for Thansksgiving.    Pertinent History Patient s/p fall November 29, 2017 resulting in diagnosis of central cord syndrome at C 6 level.  She has had therapy in multiple venues but with recent move to [facility], therapy staff recommended she seek outpatient therapy for her needs.    Currently in Pain? No/denies         OT Treatment  Pt. tolerated bilateral  shoulder flexion, extension, abduction, elbow flexion, extension forearm supination AAROM/AROM. PROM bilateral wrist extension, digit MP, PIP, and DIP flexion, and extension in conjunction with moist heat.  Pt.continues to engageher right hand whenperforming self-feeding with adpativeequipment. Pt. continues to be able to hold a specific wide width pen in her right hand, and formulate letters when positioned at the tabletop. Pt. reports that she is able to use her right thumb to hold items. Pt. tolerated ROM well without reports of pain, or discomfort. Pt.continues to benefit from working on improving ROM in order to work towards increasing engagement of her bilateral hands during ADLs, and IADL tasks.                        OT Education - 01/01/20 1357    Education Details UE ROM    Person(s) Educated Patient;Caregiver(s)    Methods Explanation;Demonstration    Comprehension Verbalized understanding;Need further instruction               OT Long Term Goals - 12/05/19 1511      OT LONG TERM GOAL #1   Title Patient and caregiver will demonstrate understanding of home exercise program for ROM.    Baseline Pt. has a restorative aide rehab aide assist her wih exercises at [facility]    Time 12    Period Weeks  Status On-going    Target Date 02/27/20      OT LONG TERM GOAL #2   Title Patient will demonstrate ability to don shirt with min assist from seated position.    Baseline indepedent with a larger shirt. Requires increased time to complete    Time 12    Period Weeks    Target Date 02/27/20      OT LONG TERM GOAL #3   Title Patient will demonstrate improved sitting balance at the edge of the bed to participate in self care tasks.    Baseline Pt. has an air mattress at [facility]    Time 12    Period Weeks    Status Deferred      OT LONG TERM GOAL #4   Title Patient will demonstrate improved composite finger flexion to hold deodorant to apply to  underarms.    Baseline Pt with improving finger flexion of R hand to use partial cylindrical grasp, but unable to grasp and hold items. Very limited digit flexion of L hand.    Time 12    Period Weeks    Status On-going    Target Date 02/27/20      OT LONG TERM GOAL #5   Title Pt will complete self feeding with setup, use of universal cuff and minimal spillage.    Baseline inconsistent with feeding and increased spillage.    Time 12    Period Weeks    Status Achieved      Long Term Additional Goals   Additional Long Term Goals Yes      OT LONG TERM GOAL #6   Title Patient will increase right UE ROM to comb the back of her hair with modified independence.    Baseline Pt. is able to comb the front, and sides of her head. Pt. can now reach the back, however cannot see what she is doing in the back of her head.    Time 12    Period Weeks    Status Partially Met    Target Date 02/27/20      OT LONG TERM GOAL #8   Title Pt. will write, and sign her name with 100% legibility, and modified independence.    Baseline Pt. has difficulty holding, and manipulating a pen for writing.    Time 12    Period Weeks    Status New    Target Date 02/27/20                 Plan - 01/01/20 1358    Clinical Impression Statement Pt.continues to engageher right hand whenperforming self-feeding with adpativeequipment. Pt. continues to be able to hold a specific wide width pen in her right hand, and formulate letters when positioned at the tabletop. Pt. reports that she is able to use her right thumb to hold items. Pt. tolerated ROM well without reports of pain, or discomfort. Pt.continues to benefit from working on improving ROM in order to work towards increasing engagement of her bilateral hands during ADLs, and IADL tasks.   OT Occupational Profile and History Detailed Assessment- Review of Records and additional review of physical, cognitive, psychosocial history related to current  functional performance    Occupational performance deficits (Please refer to evaluation for details): ADL's;IADL's;Leisure;Social Participation    Body Structure / Function / Physical Skills ADL;Continence;Dexterity;Flexibility;Strength;ROM;Balance;Coordination;FMC;IADL;Endurance;UE functional use;Decreased knowledge of use of DME;GMC    Rehab Potential Fair    Clinical Decision Making Limited treatment options, no task modification  necessary    Comorbidities Affecting Occupational Performance: Presence of comorbidities impacting occupational performance    Comorbidities impacting occupational performance description: contractures of bilateral hands, dependent transfers,    Modification or Assistance to Complete Evaluation  No modification of tasks or assist necessary to complete eval    OT Frequency 2x / week    OT Duration 12 weeks    OT Treatment/Interventions Self-care/ADL training;Cryotherapy;Therapeutic exercise;DME and/or AE instruction;Balance training;Neuromuscular education;Manual Therapy;Splinting;Moist Heat;Passive range of motion;Therapeutic activities;Patient/family education    Consulted and Agree with Plan of Care Patient           Patient will benefit from skilled therapeutic intervention in order to improve the following deficits and impairments:   Body Structure / Function / Physical Skills: ADL, Continence, Dexterity, Flexibility, Strength, ROM, Balance, Coordination, FMC, IADL, Endurance, UE functional use, Decreased knowledge of use of DME, GMC       Visit Diagnosis: Muscle weakness (generalized)  Other lack of coordination    Problem List Patient Active Problem List   Diagnosis Date Noted  . Acute appendicitis with appendiceal abscess 05/30/2018  . Hypocalcemia 02/15/2018  . Dysuria 02/15/2018  . Bilateral lower extremity edema 02/11/2018  . Vaginal yeast infection 01/30/2018  . Chest tightness 01/27/2018  . Healthcare-associated pneumonia 01/25/2018  .  Pleural effusion, not elsewhere classified 01/25/2018  . Acute deep vein thrombosis (DVT) of left lower extremity ([place]) 01/24/2018  . Acute deep vein thrombosis (DVT) of right lower extremity ([place]) 01/24/2018  . Chronic allergic rhinitis 01/24/2018  . Depression with anxiety 01/24/2018  . UTI due to Klebsiella species 01/24/2018  . Protein-calorie malnutrition, severe ([place]) 01/24/2018  . S/P insertion of IVC (inferior vena caval) filter 01/24/2018  . Reactive depression   . Hypokalemia   . Leukocytosis   . Essential hypertension   . Trauma   . 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([place])   . Neuropathic pain   . Neurogenic bowel   . Neurogenic bladder   . Benign essential HTN   . Acute blood loss anemia   . Central cord syndrome at C6 level of cervical spinal cord ([place]) 11/29/2017  . Central cord syndrome [facility]) 11/29/2017    Harrel Carina, MS, OTR/L 01/01/2020, 2:03 PM  [place] MAIN [facility] SERVICES [address] [place], [place], [address] Phone: [phone]   Fax:  [phone]  Name: Sherry [name] MRN: [id number] Date of Birth: 1934/12/19

## 2020-01-03 ENCOUNTER — Other Ambulatory Visit: Payer: Self-pay

## 2020-01-03 ENCOUNTER — Ambulatory Visit: Payer: Medicare HMO | Attending: Internal Medicine | Admitting: Physical Therapy

## 2020-01-03 DIAGNOSIS — R278 Other lack of coordination: Secondary | ICD-10-CM | POA: Insufficient documentation

## 2020-01-03 DIAGNOSIS — R2689 Other abnormalities of gait and mobility: Secondary | ICD-10-CM | POA: Diagnosis present

## 2020-01-03 DIAGNOSIS — M6281 Muscle weakness (generalized): Secondary | ICD-10-CM | POA: Insufficient documentation

## 2020-01-03 NOTE — Therapy (Signed)
[place] MAIN [facility] SERVICES [address] [place], [place], [address] Phone: [phone]   Fax:  [phone]  Physical Therapy Treatment  Patient Details  Name: Sherry [name] MRN: [id number] Date of Birth: February 03, 1934 Referring Provider (PT): Viviana Simpler   Encounter Date: 01/03/2020   PT End of Session - 01/03/20 1341    Visit Number 24    Number of Visits 25    Date for PT Re-Evaluation 01/14/20    Authorization Type aetna medicare FOTO performed by PT on eval (7/20), score 12    PT Start Time 1345    PT Stop Time 1430    PT Time Calculation (min) 45 min    Equipment Utilized During Treatment Other (comment)   lift   Activity Tolerance Patient tolerated treatment well;No increased pain    Behavior During Therapy WFL for tasks assessed/performed           Past Medical History:  Diagnosis Date  . Acute blood loss anemia   . Arthritis   . Cancer ([place])    skin  . Central cord syndrome at C6 level of cervical spinal cord ([place]) 11/29/2017  . Hypertension   . Protein-calorie malnutrition, severe ([place]) 01/24/2018  . S/P insertion of IVC (inferior vena caval) filter 01/24/2018  . 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[facility])     Past Surgical History:  Procedure Laterality Date  . ANTERIOR CERVICAL DECOMP/DISCECTOMY FUSION N/A 11/29/2017   Procedure: Cervical five-six, six-seven Anterior Cervical Decompression Fusion;  Surgeon: Judith Part, MD;  Location: [place];  Service: Neurosurgery;  Laterality: N/A;  Cervical five-six, six-seven Anterior Cervical Decompression Fusion  . CATARACT EXTRACTION    . IR IVC FILTER PLMT / S&I /IMG GUID/MOD SED  12/08/2017    There were no vitals filed for this visit.    Hoyer lift from power wc <>mat; Supine hip abduction x 20BLE, with manual resistance;  Supine hip adduction x 20BLE, with manual resistance; Supine SLR with assistx 20 BLE; Supine quad and glut sets 3s hold x 20 BLE; Hooklying heel slides  with resisted extensionx 20 bilateral lower extremity; Hooklying marchingx 20BLE, assist required forBLE; Hooklying SAQ over bolster with light manual resistance for LLE, assist for RLE to get to end range extension x 20BLE, muscle tapping at R quad to encourage improved muscle activation;  Hooklying bridges over bolster under knees3s holdx20; Penguins (oblique lateral crunch) x 20 each side; Partial crunches with assist from therapist 2-3s hold x 20; Supine anterior/posterior pelvic tilts x 2-3s holds x 20 Seated LAQ x 10 reps with minA to complete exercise on RLE    Rolling left and right with mod assistx1and therapist providing cues for proper sequencing x 5 toward each side; Reaching with BUE in supine in multidirection x 10 reps  Supine<>sit requiring maxA for sequencing and navigation of trunk and BLE Unsupported sitting for 8 mins   Unsupported sitting was introduced today with fair tolerance to upright positioning. Patient demonstrates fair- balance with dynamic balance exercises. Patient continues to demonstrate poor trunk stability as evidenced by continuous leaning to the L side and posteriorly. Patient demonstrates decreased weightbearing through L gluts and has an increased weight shift to the R. Therapist provided CGA/minA to maintain trunk in neutral position. Patient will continue to benefit from skilled physical therapy to improve generalized strength, ROM, and capacity for functional activity.       PT Short Term Goals - 12/10/19 1531      PT SHORT TERM GOAL #  1   Title The patient will perform initial HEP with minimum assistance in order to improve strength and function.    Time 4    Period Weeks    Status On-going    Target Date 12/03/19             PT Long Term Goals - 12/10/19 1534      PT LONG TERM GOAL #1   Title The patient will be compliant with finalized HEP with minimum assistance in preparation for self management and maintenance of  condition.    Time 12    Period Weeks    Status On-going    Target Date 01/14/20      PT LONG TERM GOAL #2   Title The patient will demonstrate at least 10 point improvement on FOTO score indicating an improved ability to perform functional activities.    Baseline on eval (/720) score 12; 10/22/19: 12; 12/10/19: 18    Time 12    Period Weeks    Status On-going    Target Date 01/14/20      PT LONG TERM GOAL #3   Title The patient will demonstrate lateral scooting  for assistance with transfers with minA to maximize independence.    Baseline 10/22/19: Pt requires modA+1 for lateral scooting; 12/10/19: modA+1    Time 8    Period Weeks    Status On-going    Target Date 01/14/20      PT LONG TERM GOAL #4   Title The patient will demonstrate a squat pivot transfer with minimum assistance to maximize independence and mobility.    Baseline 10/22/19: unable to safely attempt at this time; 12/10/19: unable to safely attempt at this time    Time 12    Period Weeks    Status On-going    Target Date 01/14/20      PT LONG TERM GOAL #5   Title The patient will demonstrate sitting without UE support for 2-5 minutes at EOB to improve participation and maximize independence with ADLs.    Baseline 10/22/19: Pt able ot sit unsupported for at least 2 minutes at edge of bed    Time 12    Period Weeks    Status Achieved                 Plan - 01/03/20 1519    Clinical Impression Statement Unsupported sitting was introduced today with fair tolerance to upright positioning. Patient demonstrates fair- balance with dynamic balance exercises. Patient continues to demonstrate poor trunk stability as evidenced by continuous leaning to the L side and posteriorly. Patient demonstrates decreased weightbearing through L gluts and has an increased weight shift to the R. Therapist provided CGA/minA to maintain trunk in neutral position. Patient will continue to benefit from skilled physical therapy to improve  generalized strength, ROM, and capacity for functional activity.    Personal Factors and Comorbidities Age;Time since onset of injury/illness/exacerbation;Comorbidity 3+;Fitness    Comorbidities HTN, quadriparesis, history of DVT, neurogenic bladder    Examination-Activity Limitations Bathing;Hygiene/Grooming;Squat;Bed Mobility;Lift;Bend;Stand;Engineer, manufacturing;Toileting;Self Feeding;Transfers;Continence;Sit;Dressing;Sleep;Carry    Examination-Participation Restrictions Church;Laundry;Cleaning;Medication Management;Community Activity;Meal Prep;Interpersonal Relationship    Stability/Clinical Decision Making Evolving/Moderate complexity    Rehab Potential Fair    PT Frequency 2x / week    PT Duration 12 weeks    PT Treatment/Interventions ADLs/Self Care Home Management;Electrical Stimulation;Therapeutic activities;Wheelchair mobility training;Vasopneumatic Device;Joint Manipulations;Vestibular;Passive range of motion;Patient/family education;Therapeutic exercise;DME Instruction;Biofeedback;Aquatic Therapy;Moist Heat;Gait training;Balance training;Orthotic Fit/Training;Dry needling;Energy conservation;Taping;Splinting;Neuromuscular re-education;Cryotherapy;Ultrasound;Functional mobility training    PT Next  Visit Plan sitting balance/endurance    PT Home Exercise Plan sitting balance unsupported hold 30-45sec with aide    Consulted and Agree with Plan of Care Patient    Family Member [place]           Patient will benefit from skilled therapeutic intervention in order to improve the following deficits and impairments:  Abnormal gait, Decreased balance, Decreased endurance, Decreased mobility, Difficulty walking, Hypomobility, Impaired sensation, Decreased range of motion, Improper body mechanics, Impaired perceived functional ability, Decreased activity tolerance, Decreased knowledge of use of DME, Decreased safety awareness, Decreased strength, Impaired flexibility,  Impaired UE functional use, Postural dysfunction  Visit Diagnosis: Muscle weakness (generalized)  Other lack of coordination     Problem List Patient Active Problem List   Diagnosis Date Noted  . Acute appendicitis with appendiceal abscess 05/30/2018  . Hypocalcemia 02/15/2018  . Dysuria 02/15/2018  . Bilateral lower extremity edema 02/11/2018  . Vaginal yeast infection 01/30/2018  . Chest tightness 01/27/2018  . Healthcare-associated pneumonia 01/25/2018  . Pleural effusion, not elsewhere classified 01/25/2018  . Acute deep vein thrombosis (DVT) of left lower extremity ([place]) 01/24/2018  . Acute deep vein thrombosis (DVT) of right lower extremity ([place]) 01/24/2018  . Chronic allergic rhinitis 01/24/2018  . Depression with anxiety 01/24/2018  . UTI due to Klebsiella species 01/24/2018  . Protein-calorie malnutrition, severe ([place]) 01/24/2018  . S/P insertion of IVC (inferior vena caval) filter 01/24/2018  . Reactive depression   . Hypokalemia   . Leukocytosis   . Essential hypertension   . Trauma   . 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([place])   . Neuropathic pain   . Neurogenic bowel   . Neurogenic bladder   . Benign essential HTN   . Acute blood loss anemia   . Central cord syndrome at C6 level of cervical spinal cord ([place]) 11/29/2017  . Central cord syndrome [facility]) 11/29/2017   Karl Luke PT, DPT Netta Corrigan 01/03/2020, 3:21 PM  [place] MAIN [facility] SERVICES [address] [place], [place], [address] Phone: [phone]   Fax:  [phone]  Name: GUENEVERE ROORDA MRN: [id number] Date of Birth: [birth year]

## 2020-01-07 ENCOUNTER — Ambulatory Visit: Payer: Medicare HMO | Admitting: Physical Therapy

## 2020-01-07 ENCOUNTER — Ambulatory Visit: Payer: Medicare HMO | Admitting: Occupational Therapy

## 2020-01-07 ENCOUNTER — Encounter: Payer: Self-pay | Admitting: Occupational Therapy

## 2020-01-07 ENCOUNTER — Other Ambulatory Visit: Payer: Self-pay

## 2020-01-07 DIAGNOSIS — M6281 Muscle weakness (generalized): Secondary | ICD-10-CM | POA: Diagnosis not present

## 2020-01-07 DIAGNOSIS — R2689 Other abnormalities of gait and mobility: Secondary | ICD-10-CM

## 2020-01-07 DIAGNOSIS — R278 Other lack of coordination: Secondary | ICD-10-CM

## 2020-01-07 NOTE — Therapy (Signed)
[place] MAIN [facility] SERVICES [address] [place], [place], [address] Phone: [phone]   Fax:  [phone]  Occupational Therapy Treatment  Patient Details  Name: Sherry [name] MRN: [id number] Date of Birth: 1934/12/25 No data recorded  Encounter Date: 01/07/2020   OT End of Session - 01/07/20 1701    Visit Number 19    Number of Visits 24    Date for OT Re-Evaluation 02/27/20    Authorization Time Period Progress report period starting 09/17/19    OT Start Time 1300    OT Stop Time 1345    OT Time Calculation (min) 45 min    Equipment Utilized During Treatment moist heat    Activity Tolerance Patient tolerated treatment well    Behavior During Therapy [facility] for tasks assessed/performed           Past Medical History:  Diagnosis Date  . Acute blood loss anemia   . Arthritis   . Cancer ([place])    skin  . Central cord syndrome at C6 level of cervical spinal cord ([place]) 11/29/2017  . Hypertension   . Protein-calorie malnutrition, severe ([place]) 01/24/2018  . S/P insertion of IVC (inferior vena caval) filter 01/24/2018  . 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[facility])     Past Surgical History:  Procedure Laterality Date  . ANTERIOR CERVICAL DECOMP/DISCECTOMY FUSION N/A 11/29/2017   Procedure: Cervical five-six, six-seven Anterior Cervical Decompression Fusion;  Surgeon: Judith Part, MD;  Location: [place];  Service: Neurosurgery;  Laterality: N/A;  Cervical five-six, six-seven Anterior Cervical Decompression Fusion  . CATARACT EXTRACTION    . IR IVC FILTER PLMT / S&I /IMG GUID/MOD SED  12/08/2017    There were no vitals filed for this visit.   Subjective Assessment - 01/07/20 1701    Subjective  Pt. reports that she is doing well today    Pertinent History Patient s/p fall November 29, 2017 resulting in diagnosis of central cord syndrome at C 6 level.  She has had therapy in multiple venues but with recent move to [facility], therapy staff recommended  she seek outpatient therapy for her needs.    Patient Stated Goals Patient would like to be able to move better in bed, stand and perform self care tasks.    Currently in Pain? No/denies           OT Treatment  Pt.tolerated bilateral shoulder flexion, extension, abduction, elbow flexion, extension forearm supination AAROM/AROM. PROM bilateral wrist extension, digit MP, PIP, and DIP flexion, and extension in conjunction with moist heat.  Pt.is now able to write single word items of her daily menus using her large width pen. Pt. continues to engageher right hand whenperforming self-feeding with adpativeequipment.Pt. continues to be able to hold a specific wide width pen in her right hand, and formulate letters when positioned at the tabletop.Pt. reports that she is able to use her right thumb to hold items. Pt. tolerated ROM well without reports of pain, or discomfort. Pt.continues to benefit from working on improving ROM in order to work towards increasing engagement of her bilateral hands during ADLs, and IADL tasks.                         OT Education - 01/07/20 1701    Education Details UE ROM    Person(s) Educated Patient;Caregiver(s)    Methods Explanation;Demonstration    Comprehension Verbalized understanding;Need further instruction  OT Long Term Goals - 12/05/19 1511      OT LONG TERM GOAL #1   Title Patient and caregiver will demonstrate understanding of home exercise program for ROM.    Baseline Pt. has a restorative aide rehab aide assist her wih exercises at [facility]    Time 12    Period Weeks    Status On-going    Target Date 02/27/20      OT LONG TERM GOAL #2   Title Patient will demonstrate ability to don shirt with min assist from seated position.    Baseline indepedent with a larger shirt. Requires increased time to complete    Time 12    Period Weeks    Target Date 02/27/20      OT LONG TERM GOAL #3    Title Patient will demonstrate improved sitting balance at the edge of the bed to participate in self care tasks.    Baseline Pt. has an air mattress at [facility]    Time 12    Period Weeks    Status Deferred      OT LONG TERM GOAL #4   Title Patient will demonstrate improved composite finger flexion to hold deodorant to apply to underarms.    Baseline Pt with improving finger flexion of R hand to use partial cylindrical grasp, but unable to grasp and hold items. Very limited digit flexion of L hand.    Time 12    Period Weeks    Status On-going    Target Date 02/27/20      OT LONG TERM GOAL #5   Title Pt will complete self feeding with setup, use of universal cuff and minimal spillage.    Baseline inconsistent with feeding and increased spillage.    Time 12    Period Weeks    Status Achieved      Long Term Additional Goals   Additional Long Term Goals Yes      OT LONG TERM GOAL #6   Title Patient will increase right UE ROM to comb the back of her hair with modified independence.    Baseline Pt. is able to comb the front, and sides of her head. Pt. can now reach the back, however cannot see what she is doing in the back of her head.    Time 12    Period Weeks    Status Partially Met    Target Date 02/27/20      OT LONG TERM GOAL #8   Title Pt. will write, and sign her name with 100% legibility, and modified independence.    Baseline Pt. has difficulty holding, and manipulating a pen for writing.    Time 12    Period Weeks    Status New    Target Date 02/27/20                 Plan - 01/07/20 1712    Clinical Impression Statement Pt.is now able to write single word items of her daily menus using her large width pen. Pt. continues to engageher right hand whenperforming self-feeding with adpativeequipment.Pt. continues to be able to hold a specific wide width pen in her right hand, and formulate letters when positioned at the tabletop.Pt. reports that she is able  to use her right thumb to hold items. Pt. tolerated ROM well without reports of pain, or discomfort. Pt.continues to benefit from working on improving ROM in order to work towards increasing engagement of her bilateral hands during  ADLs, and IADL tasks.   OT Occupational Profile and History Detailed Assessment- Review of Records and additional review of physical, cognitive, psychosocial history related to current functional performance    Occupational performance deficits (Please refer to evaluation for details): ADL's;IADL's;Leisure;Social Participation    Body Structure / Function / Physical Skills ADL;Continence;Dexterity;Flexibility;Strength;ROM;Balance;Coordination;FMC;IADL;Endurance;UE functional use;Decreased knowledge of use of DME;GMC    Psychosocial Skills Environmental  Adaptations;Habits;Routines and Behaviors    Rehab Potential Fair    Clinical Decision Making Limited treatment options, no task modification necessary    Comorbidities Affecting Occupational Performance: Presence of comorbidities impacting occupational performance    Modification or Assistance to Complete Evaluation  No modification of tasks or assist necessary to complete eval    OT Frequency 2x / week    OT Duration 12 weeks    OT Treatment/Interventions Self-care/ADL training;Cryotherapy;Therapeutic exercise;DME and/or AE instruction;Balance training;Neuromuscular education;Manual Therapy;Splinting;Moist Heat;Passive range of motion;Therapeutic activities;Patient/family education    Consulted and Agree with Plan of Care Patient           Patient will benefit from skilled therapeutic intervention in order to improve the following deficits and impairments:   Body Structure / Function / Physical Skills: ADL, Continence, Dexterity, Flexibility, Strength, ROM, Balance, Coordination, FMC, IADL, Endurance, UE functional use, Decreased knowledge of use of DME, GMC   Psychosocial Skills: Environmental  Adaptations,  Habits, Routines and Behaviors   Visit Diagnosis: Muscle weakness (generalized)  Other lack of coordination    Problem List Patient Active Problem List   Diagnosis Date Noted  . Acute appendicitis with appendiceal abscess 05/30/2018  . Hypocalcemia 02/15/2018  . Dysuria 02/15/2018  . Bilateral lower extremity edema 02/11/2018  . Vaginal yeast infection 01/30/2018  . Chest tightness 01/27/2018  . Healthcare-associated pneumonia 01/25/2018  . Pleural effusion, not elsewhere classified 01/25/2018  . Acute deep vein thrombosis (DVT) of left lower extremity ([place]) 01/24/2018  . Acute deep vein thrombosis (DVT) of right lower extremity ([place]) 01/24/2018  . Chronic allergic rhinitis 01/24/2018  . Depression with anxiety 01/24/2018  . UTI due to Klebsiella species 01/24/2018  . Protein-calorie malnutrition, severe ([place]) 01/24/2018  . S/P insertion of IVC (inferior vena caval) filter 01/24/2018  . Reactive depression   . Hypokalemia   . Leukocytosis   . Essential hypertension   . Trauma   . 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([place])   . Neuropathic pain   . Neurogenic bowel   . Neurogenic bladder   . Benign essential HTN   . Acute blood loss anemia   . Central cord syndrome at C6 level of cervical spinal cord ([place]) 11/29/2017  . Central cord syndrome [facility]) 11/29/2017    Harrel Carina, MS, OTR/L 01/07/2020, 5:13 PM  [place] MAIN [facility] SERVICES [address] [place], [place], [address] Phone: [phone]   Fax:  [phone]  Name: Sherry [name] MRN: [id number] Date of Birth: August 30, 1934

## 2020-01-07 NOTE — Therapy (Signed)
[place] MAIN [facility] SERVICES [address] [place], [place], [address] Phone: [phone]   Fax:  [phone]  Physical Therapy Treatment  Patient Details  Name: Sherry [name] MRN: [id number] Date of Birth: Jul 25, 1934 Referring Provider (PT): Viviana Simpler   Encounter Date: 01/07/2020   PT End of Session - 01/07/20 1455    Visit Number 25    Number of Visits 30    Date for PT Re-Evaluation 01/14/20    Authorization Type aetna medicare FOTO performed by PT on eval (7/20), score 12    PT Start Time 1345    PT Stop Time 1430    PT Time Calculation (min) 45 min    Equipment Utilized During Treatment Other (comment)   lift   Activity Tolerance Patient tolerated treatment well;No increased pain    Behavior During Therapy WFL for tasks assessed/performed           Past Medical History:  Diagnosis Date  . Acute blood loss anemia   . Arthritis   . Cancer ([place])    skin  . Central cord syndrome at C6 level of cervical spinal cord ([place]) 11/29/2017  . Hypertension   . Protein-calorie malnutrition, severe ([place]) 01/24/2018  . S/P insertion of IVC (inferior vena caval) filter 01/24/2018  . 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[facility])     Past Surgical History:  Procedure Laterality Date  . ANTERIOR CERVICAL DECOMP/DISCECTOMY FUSION N/A 11/29/2017   Procedure: Cervical five-six, six-seven Anterior Cervical Decompression Fusion;  Surgeon: Judith Part, MD;  Location: [place];  Service: Neurosurgery;  Laterality: N/A;  Cervical five-six, six-seven Anterior Cervical Decompression Fusion  . CATARACT EXTRACTION    . IR IVC FILTER PLMT / S&I /IMG GUID/MOD SED  12/08/2017    There were no vitals filed for this visit.   Subjective Assessment - 01/07/20 1454    Subjective Patient denies of any new symptoms since last therapy session. She states her weekend went well and she denies of any soreness.    Patient is accompained by: --   [name] (aide)   Pertinent  History Pt is an 85 yo female that fell in 2019, fractured vertebrae in her neck and in her low back per family. 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 After hospital stay, pt discharged to [facility] for ~1 month, experienced severe UTI as well as bilateral DVT (IVC filter placed). Discharged to [facility], stayed for about a year, and then moved to [facility] in April 2021. Was receiving PT, but per family facility reported that did not have adequate equipment to maximize PT for her. Pt until about 1 month ago was practicing sit to stand transfers with 1-2 people, and working on static standing. Has a brace for L foot due to PF. Pt currently needs assistance with all ADLs (able to assist with donning/doffing her shirt), bed baths, and needs a hoyer lift for transfers. Able to drive power wheelchair without assistance.    Limitations Standing;Walking;House hold activities;Lifting    How long can you sit comfortably? NA    How long can you stand comfortably? unable    How long can you walk comfortably? unable    Patient Stated Goals "to stand up and walk"    Currently in Pain? No/denies            Hoyer lift transfer from [employer]  Patient completed all exercises in today's session in unsupported sitting: Trunk twists with soft red ball x 20 Hip adduction with  soft red ball squeezes x 20 Ball taps in multidirection x 30 each arm  Balloon toss x multidirection (emphasis on reaching) Glut squeezes x 30 reps   Transfer from supine<>EOB requiring maxA for maneuvering BLE       PT Short Term Goals - 12/10/19 1531      PT SHORT TERM GOAL #1   Title The patient will perform initial HEP with minimum assistance in order to improve strength and function.    Time 4    Period Weeks    Status On-going    Target Date 12/03/19             PT Long Term Goals - 12/10/19 1534      PT LONG TERM GOAL #1   Title The patient will be compliant with finalized HEP with minimum  assistance in preparation for self management and maintenance of condition.    Time 12    Period Weeks    Status On-going    Target Date 01/14/20      PT LONG TERM GOAL #2   Title The patient will demonstrate at least 10 point improvement on FOTO score indicating an improved ability to perform functional activities.    Baseline on eval (/720) score 12; 10/22/19: 12; 12/10/19: 18    Time 12    Period Weeks    Status On-going    Target Date 01/14/20      PT LONG TERM GOAL #3   Title The patient will demonstrate lateral scooting  for assistance with transfers with minA to maximize independence.    Baseline 10/22/19: Pt requires modA+1 for lateral scooting; 12/10/19: modA+1    Time 8    Period Weeks    Status On-going    Target Date 01/14/20      PT LONG TERM GOAL #4   Title The patient will demonstrate a squat pivot transfer with minimum assistance to maximize independence and mobility.    Baseline 10/22/19: unable to safely attempt at this time; 12/10/19: unable to safely attempt at this time    Time 12    Period Weeks    Status On-going    Target Date 01/14/20      PT LONG TERM GOAL #5   Title The patient will demonstrate sitting without UE support for 2-5 minutes at EOB to improve participation and maximize independence with ADLs.    Baseline 10/22/19: Pt able ot sit unsupported for at least 2 minutes at edge of bed    Time 12    Period Weeks    Status Achieved                 Plan - 01/07/20 1456    Clinical Impression Statement Progressed patient with unsupported sitting throughout whole therapy session. Patient demonstrates decreased weight bearing through L glut due to discomfort. Dynamic seated exercises were introduced today with fair balance. Patient able to reach in multidirections with minimal LOB to the left and was able to recover independently to neutral seated position. Patient's anticipatory and reactive balance were challenged today with ball toss with fair  tolerance to activity. Therapy will continue to progress patient with more challenging positions in order to improve patient's quality of life.    Personal Factors and Comorbidities Age;Time since onset of injury/illness/exacerbation;Comorbidity 3+;Fitness    Comorbidities HTN, quadriparesis, history of DVT, neurogenic bladder    Examination-Activity Limitations Bathing;Hygiene/Grooming;Squat;Bed Mobility;Lift;Bend;Stand;Engineer, manufacturing;Toileting;Self Feeding;Transfers;Continence;Sit;Dressing;Sleep;Carry    Examination-Participation Restrictions Church;Laundry;Cleaning;Medication Management;Community Activity;Meal Prep;Interpersonal Relationship  Stability/Clinical Decision Making Evolving/Moderate complexity    Rehab Potential Fair    PT Frequency 2x / week    PT Duration 12 weeks    PT Treatment/Interventions ADLs/Self Care Home Management;Electrical Stimulation;Therapeutic activities;Wheelchair mobility training;Vasopneumatic Device;Joint Manipulations;Vestibular;Passive range of motion;Patient/family education;Therapeutic exercise;DME Instruction;Biofeedback;Aquatic Therapy;Moist Heat;Gait training;Balance training;Orthotic Fit/Training;Dry needling;Energy conservation;Taping;Splinting;Neuromuscular re-education;Cryotherapy;Ultrasound;Functional mobility training    PT Next Visit Plan sitting balance/endurance    PT Home Exercise Plan sitting balance unsupported hold 30-45sec with aide    Consulted and Agree with Plan of Care Patient    Family Member [place]           Patient will benefit from skilled therapeutic intervention in order to improve the following deficits and impairments:  Abnormal gait, Decreased balance, Decreased endurance, Decreased mobility, Difficulty walking, Hypomobility, Impaired sensation, Decreased range of motion, Improper body mechanics, Impaired perceived functional ability, Decreased activity tolerance, Decreased knowledge of use of  DME, Decreased safety awareness, Decreased strength, Impaired flexibility, Impaired UE functional use, Postural dysfunction  Visit Diagnosis: Muscle weakness (generalized)  Other lack of coordination  Other abnormalities of gait and mobility     Problem List Patient Active Problem List   Diagnosis Date Noted  . Acute appendicitis with appendiceal abscess 05/30/2018  . Hypocalcemia 02/15/2018  . Dysuria 02/15/2018  . Bilateral lower extremity edema 02/11/2018  . Vaginal yeast infection 01/30/2018  . Chest tightness 01/27/2018  . Healthcare-associated pneumonia 01/25/2018  . Pleural effusion, not elsewhere classified 01/25/2018  . Acute deep vein thrombosis (DVT) of left lower extremity ([place]) 01/24/2018  . Acute deep vein thrombosis (DVT) of right lower extremity ([place]) 01/24/2018  . Chronic allergic rhinitis 01/24/2018  . Depression with anxiety 01/24/2018  . UTI due to Klebsiella species 01/24/2018  . Protein-calorie malnutrition, severe ([place]) 01/24/2018  . S/P insertion of IVC (inferior vena caval) filter 01/24/2018  . Reactive depression   . Hypokalemia   . Leukocytosis   . Essential hypertension   . Trauma   . 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([place])   . Neuropathic pain   . Neurogenic bowel   . Neurogenic bladder   . Benign essential HTN   . Acute blood loss anemia   . Central cord syndrome at C6 level of cervical spinal cord ([place]) 11/29/2017  . Central cord syndrome [facility]) 11/29/2017   Karl Luke PT, DPT Netta Corrigan 01/07/2020, 3:04 PM  [place] MAIN [facility] SERVICES [address] [place], [place], [address] Phone: [phone]   Fax:  [phone]  Name: Sherry [name] MRN: [id number] Date of Birth: [birth year]

## 2020-01-09 ENCOUNTER — Ambulatory Visit: Payer: Medicare HMO | Admitting: Physical Therapy

## 2020-01-09 ENCOUNTER — Encounter: Payer: Self-pay | Admitting: Occupational Therapy

## 2020-01-09 ENCOUNTER — Other Ambulatory Visit: Payer: Self-pay

## 2020-01-09 ENCOUNTER — Ambulatory Visit: Payer: Medicare HMO | Admitting: Occupational Therapy

## 2020-01-09 DIAGNOSIS — R2689 Other abnormalities of gait and mobility: Secondary | ICD-10-CM

## 2020-01-09 DIAGNOSIS — R278 Other lack of coordination: Secondary | ICD-10-CM

## 2020-01-09 DIAGNOSIS — M6281 Muscle weakness (generalized): Secondary | ICD-10-CM

## 2020-01-09 NOTE — Therapy (Signed)
[place] MAIN [facility] SERVICES [address] [place], [place], [address] Phone: [phone]   Fax:  [phone]  Physical Therapy Treatment  Patient Details  Name: NKECHI LINEHAN MRN: [id number] Date of Birth: 11/11/1934 Referring Provider (PT): Viviana Simpler   Encounter Date: 01/09/2020   PT End of Session - 01/09/20 1837    Visit Number 26    Number of Visits 30    Date for PT Re-Evaluation 01/14/20    Authorization Type aetna medicare FOTO performed by PT on eval (7/20), score 12    PT Start Time 1430    PT Stop Time 1515    PT Time Calculation (min) 45 min    Equipment Utilized During Treatment Other (comment)   lift   Activity Tolerance Patient tolerated treatment well;No increased pain    Behavior During Therapy WFL for tasks assessed/performed           Past Medical History:  Diagnosis Date  . Acute blood loss anemia   . Arthritis   . Cancer ([place])    skin  . Central cord syndrome at C6 level of cervical spinal cord ([place]) 11/29/2017  . Hypertension   . Protein-calorie malnutrition, severe ([place]) 01/24/2018  . S/P insertion of IVC (inferior vena caval) filter 01/24/2018  . 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[facility])     Past Surgical History:  Procedure Laterality Date  . ANTERIOR CERVICAL DECOMP/DISCECTOMY FUSION N/A 11/29/2017   Procedure: Cervical five-six, six-seven Anterior Cervical Decompression Fusion;  Surgeon: Judith Part, MD;  Location: [place];  Service: Neurosurgery;  Laterality: N/A;  Cervical five-six, six-seven Anterior Cervical Decompression Fusion  . CATARACT EXTRACTION    . IR IVC FILTER PLMT / S&I /IMG GUID/MOD SED  12/08/2017    There were no vitals filed for this visit.   Subjective Assessment - 01/09/20 1522    Subjective No new symptoms or pain since last therapy session is noted at today's reporting period.    Patient is accompained by: --   [name] (aide)   Pertinent History Pt is an 85 yo female that fell in  2019, fractured vertebrae in her neck and in her low back per family. 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 After hospital stay, pt discharged to CIR for ~1 month, experienced severe UTI as well as bilateral DVT (IVC filter placed). Discharged to [facility], stayed for about a year, and then moved to [facility] in April 2021. Was receiving PT, but per family facility reported that did not have adequate equipment to maximize PT for her. Pt until about 1 month ago was practicing sit to stand transfers with 1-2 people, and working on static standing. Has a brace for L foot due to PF. Pt currently needs assistance with all ADLs (able to assist with donning/doffing her shirt), bed baths, and needs a hoyer lift for transfers. Able to drive power wheelchair without assistance.    Limitations Standing;Walking;House hold activities;Lifting    How long can you sit comfortably? NA    How long can you stand comfortably? unable    How long can you walk comfortably? unable    Patient Stated Goals "to stand up and walk"    Currently in Pain? No/denies           Hoyer lift transfer from [employer]  Patient completed all exercises in today's session in unsupported sitting: Trunk twists with soft red ball x 20 Hip adduction with soft red ball squeezes x 20 Balloon toss  in multidirection x 30 each arm  Ball taps x multidirection (emphasis on reaching) Ball toss in multidirection Weighted ball (red) going out and up x 20 reps, yellow x 5 reps Glut squeezes x 30 reps  Scapular squeezes x GTB x 15 reps each UE Modified PNF D2 flexion x GTB x 10 reps each UE Bicep curls x GTB x 15 reps each UE  Transfer from supine<>EOB requiring maxA for maneuvering BLE       PT Short Term Goals - 12/10/19 1531      PT SHORT TERM GOAL #1   Title The patient will perform initial HEP with minimum assistance in order to improve strength and function.    Time 4    Period Weeks    Status On-going     Target Date 12/03/19             PT Long Term Goals - 12/10/19 1534      PT LONG TERM GOAL #1   Title The patient will be compliant with finalized HEP with minimum assistance in preparation for self management and maintenance of condition.    Time 12    Period Weeks    Status On-going    Target Date 01/14/20      PT LONG TERM GOAL #2   Title The patient will demonstrate at least 10 point improvement on FOTO score indicating an improved ability to perform functional activities.    Baseline on eval (/720) score 12; 10/22/19: 12; 12/10/19: 18    Time 12    Period Weeks    Status On-going    Target Date 01/14/20      PT LONG TERM GOAL #3   Title The patient will demonstrate lateral scooting  for assistance with transfers with minA to maximize independence.    Baseline 10/22/19: Pt requires modA+1 for lateral scooting; 12/10/19: modA+1    Time 8    Period Weeks    Status On-going    Target Date 01/14/20      PT LONG TERM GOAL #4   Title The patient will demonstrate a squat pivot transfer with minimum assistance to maximize independence and mobility.    Baseline 10/22/19: unable to safely attempt at this time; 12/10/19: unable to safely attempt at this time    Time 12    Period Weeks    Status On-going    Target Date 01/14/20      PT LONG TERM GOAL #5   Title The patient will demonstrate sitting without UE support for 2-5 minutes at EOB to improve participation and maximize independence with ADLs.    Baseline 10/22/19: Pt able ot sit unsupported for at least 2 minutes at edge of bed    Time 12    Period Weeks    Status Achieved                 Plan - 01/09/20 1838    Clinical Impression Statement Patient completed all strengthening exercises in unsupported sitting today with good tolerance to activity. Patient demonstrates good BUE coordination with balloon taps with minimal LOB towards left side. Therapist providede verbal cues to activate TA in order to maintain vertical  position with fair demonstration. Therapy will continue to progress patient with emphasis on trunk stability and improve functional activity tolerance.    Personal Factors and Comorbidities Age;Time since onset of injury/illness/exacerbation;Comorbidity 3+;Fitness    Comorbidities HTN, quadriparesis, history of DVT, neurogenic bladder    Examination-Activity Limitations Bathing;Hygiene/Grooming;Squat;Bed Mobility;Lift;Bend;Stand;Locomotion Level;Reach  Overhead;Toileting;Self Feeding;Transfers;Continence;Sit;Dressing;Sleep;Carry    Examination-Participation Restrictions Church;Laundry;Cleaning;Medication Management;Community Activity;Meal Prep;Interpersonal Relationship    Stability/Clinical Decision Making Evolving/Moderate complexity    Rehab Potential Fair    PT Frequency 2x / week    PT Duration 12 weeks    PT Treatment/Interventions ADLs/Self Care Home Management;Electrical Stimulation;Therapeutic activities;Wheelchair mobility training;Vasopneumatic Device;Joint Manipulations;Vestibular;Passive range of motion;Patient/family education;Therapeutic exercise;DME Instruction;Biofeedback;Aquatic Therapy;Moist Heat;Gait training;Balance training;Orthotic Fit/Training;Dry needling;Energy conservation;Taping;Splinting;Neuromuscular re-education;Cryotherapy;Ultrasound;Functional mobility training    PT Next Visit Plan sitting balance/endurance    PT Home Exercise Plan sitting balance unsupported hold 30-45sec with aide    Consulted and Agree with Plan of Care Patient    Family Member [place]           Patient will benefit from skilled therapeutic intervention in order to improve the following deficits and impairments:  Abnormal gait, Decreased balance, Decreased endurance, Decreased mobility, Difficulty walking, Hypomobility, Impaired sensation, Decreased range of motion, Improper body mechanics, Impaired perceived functional ability, Decreased activity tolerance, Decreased knowledge of  use of DME, Decreased safety awareness, Decreased strength, Impaired flexibility, Impaired UE functional use, Postural dysfunction  Visit Diagnosis: Muscle weakness (generalized)  Other lack of coordination  Other abnormalities of gait and mobility     Problem List Patient Active Problem List   Diagnosis Date Noted  . Acute appendicitis with appendiceal abscess 05/30/2018  . Hypocalcemia 02/15/2018  . Dysuria 02/15/2018  . Bilateral lower extremity edema 02/11/2018  . Vaginal yeast infection 01/30/2018  . Chest tightness 01/27/2018  . Healthcare-associated pneumonia 01/25/2018  . Pleural effusion, not elsewhere classified 01/25/2018  . Acute deep vein thrombosis (DVT) of left lower extremity ([place]) 01/24/2018  . Acute deep vein thrombosis (DVT) of right lower extremity ([place]) 01/24/2018  . Chronic allergic rhinitis 01/24/2018  . Depression with anxiety 01/24/2018  . UTI due to Klebsiella species 01/24/2018  . Protein-calorie malnutrition, severe ([place]) 01/24/2018  . S/P insertion of IVC (inferior vena caval) filter 01/24/2018  . Reactive depression   . Hypokalemia   . Leukocytosis   . Essential hypertension   . Trauma   . 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([place])   . Neuropathic pain   . Neurogenic bowel   . Neurogenic bladder   . Benign essential HTN   . Acute blood loss anemia   . Central cord syndrome at C6 level of cervical spinal cord ([place]) 11/29/2017  . Central cord syndrome [facility]) 11/29/2017   Karl Luke PT, DPT Netta Corrigan 01/09/2020, 6:41 PM  [place] MAIN [facility] SERVICES [address] [place], [place], [address] Phone: [phone]   Fax:  [phone]  Name: GRAYSON WHITE MRN: [id number] Date of Birth: 07-11-1934

## 2020-01-09 NOTE — Therapy (Addendum)
[place] MAIN [facility] SERVICES [address] [place], [place], [address] Phone: [phone]   Fax:  [phone]  Occupational Therapy Progress Note  Dates of reporting period  09/17/2019   to   01/09/2020  Patient Details  Name: Sherry [name] MRN: [id number] Date of Birth: [birth year] No data recorded  Encounter Date: 01/09/2020   OT End of Session - 01/09/20 2123    Visit Number 20    Number of Visits 24    Date for OT Re-Evaluation 02/27/20    Authorization Time Period Progress report period starting 09/17/19    OT Start Time 1345    OT Stop Time 1430    OT Time Calculation (min) 45 min    Activity Tolerance Patient tolerated treatment well    Behavior During Therapy [facility] for tasks assessed/performed           Past Medical History:  Diagnosis Date  . Acute blood loss anemia   . Arthritis   . Cancer ([place])    skin  . Central cord syndrome at C6 level of cervical spinal cord ([place]) 11/29/2017  . Hypertension   . Protein-calorie malnutrition, severe ([place]) 01/24/2018  . S/P insertion of IVC (inferior vena caval) filter 01/24/2018  . 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[facility])     Past Surgical History:  Procedure Laterality Date  . ANTERIOR CERVICAL DECOMP/DISCECTOMY FUSION N/A 11/29/2017   Procedure: Cervical five-six, six-seven Anterior Cervical Decompression Fusion;  Surgeon: Judith Part, MD;  Location: [place];  Service: Neurosurgery;  Laterality: N/A;  Cervical five-six, six-seven Anterior Cervical Decompression Fusion  . CATARACT EXTRACTION    . IR IVC FILTER PLMT / S&I /IMG GUID/MOD SED  12/08/2017    There were no vitals filed for this visit.   Subjective Assessment - 01/09/20 2122    Subjective  Pt. reports that she has been writing more at home, and completing more crossword puzzles.    Pertinent History Patient s/p fall November 29, 2017 resulting in diagnosis of central cord syndrome at C 6 level.  She has had therapy in multiple venues  but with recent move to [facility], therapy staff recommended she seek outpatient therapy for her needs.    Patient Stated Goals Patient would like to be able to move better in bed, stand and perform self care tasks.    Currently in Pain? No/denies          OT Treatment  Therapeutic Ex.  Pt.tolerated bilateral shoulder flexion, extension, abduction, elbow flexion, extension, forearm supination AAROM/AROM. PROM bilateral wrist extension, digit MP, PIP, and DIP flexion, and extension in conjunction with moist heat.  Self-care:  Pt. worked on formulating her signature, lists of capital letters in cursive, and lists of holiday songs.  Pt. is making progress, andcontinues to now write single word items for her daily menus using her large width pen, as well as completing word finding puzzles. Pt. continues to engageher right hand whenperforming self-feeding with adpativeequipment.Pt.continues to beable to hold a specific wide width pen in her right hand, and formulate letters when positioned at her lap. Pt. reports that she is able to use her right thumb to hold items. Pt. tolerated ROM well without reports of pain, or discomfort. Pt.continues to benefit from working on improving ROM in order to work towards increasing engagement of her bilateral hands during ADLs, and IADL tasks.Pt. Continues to work towards the same [place], and goals.  OT Education - 01/09/20 2123    Education Details UE ROM    Person(s) Educated Patient;Caregiver(s)    Methods Explanation;Demonstration    Comprehension Verbalized understanding;Need further instruction               OT Long Term Goals - 01/09/20 2134      OT LONG TERM GOAL #1   Title Patient and caregiver will demonstrate understanding of home exercise program for ROM.    Baseline Pt. has a restorative aide rehab aide assist her wih exercises at [facility]    Time 12    Period Weeks    Status  On-going    Target Date 02/27/20      OT LONG TERM GOAL #2   Title Patient will demonstrate ability to don shirt with min assist from seated position.    Baseline indepedent with a larger shirt. Requires increased time to complete    Time 12    Period Weeks    Status On-going    Target Date 02/27/20      OT LONG TERM GOAL #3   Title Patient will demonstrate improved sitting balance at the edge of the bed to participate in self care tasks.    Baseline Pt. has an air mattress at [facility]    Time 12    Period Weeks    Status Deferred      OT LONG TERM GOAL #4   Title Patient will demonstrate improved composite finger flexion to hold deodorant to apply to underarms.    Baseline Pt. continues to present with improving finger flexion of R hand to use partial cylindrical grasp, but unable to grasp and hold items. Very limited digit flexion of L hand.    Time 12    Period Weeks    Status On-going    Target Date 02/27/20      OT LONG TERM GOAL #6   Title Patient will increase right UE ROM to comb the back of her hair with modified independence.    Baseline Pt. is able to comb the front, and sides of her head. Pt. can now reach the back, however cannot see what she is doing in the back of her head.    Time 12    Period Weeks    Status Partially Met    Target Date 02/27/20      OT LONG TERM GOAL #8   Title Pt. will write, and sign her name with 100% legibility, and modified independence.    Baseline Pt. is able to maintain grasp on a wide width pen. [place] to work on Media pl[employer].    Time 12    Period Weeks    Status On-going    Target Date 02/27/20                 Plan - 01/09/20 2123    Clinical Impression Statement Pt. is making progress, andcontinues to now write single word items for her daily menus using her large width pen, as well as completing word finding puzzles. Pt. continues to engageher right hand whenperforming self-feeding with  adpativeequipment.Pt.continues to beable to hold a specific wide width pen in her right hand, and formulate letters when positioned at her lap. Pt. reports that she is able to use her right thumb to hold items. Pt. tolerated ROM well without reports of pain, or discomfort. Pt.continues to benefit from working on improving ROM in order to work towards increasing engagement of her bilateral hands  during ADLs, and IADL tasks.Pt. Continues to work towards the same [place], and goals.     OT Occupational Profile and History Detailed Assessment- Review of Records and additional review of physical, cognitive, psychosocial history related to current functional performance    Occupational performance deficits (Please refer to evaluation for details): ADL's;IADL's;Leisure;Social Participation    Body Structure / Function / Physical Skills ADL;Continence;Dexterity;Flexibility;Strength;ROM;Balance;Coordination;FMC;IADL;Endurance;UE functional use;Decreased knowledge of use of DME;GMC    Rehab Potential Fair    Clinical Decision Making Limited treatment options, no task modification necessary    Comorbidities Affecting Occupational Performance: Presence of comorbidities impacting occupational performance    OT Frequency 2x / week    OT Duration 12 weeks    OT Treatment/Interventions Self-care/ADL training;Cryotherapy;Therapeutic exercise;DME and/or AE instruction;Balance training;Neuromuscular education;Manual Therapy;Splinting;Moist Heat;Passive range of motion;Therapeutic activities;Patient/family education    Consulted and Agree with Plan of Care Patient           Patient will benefit from skilled therapeutic intervention in order to improve the following deficits and impairments:   Body Structure / Function / Physical Skills: ADL, Continence, Dexterity, Flexibility, Strength, ROM, Balance, Coordination, FMC, IADL, Endurance, UE functional use, Decreased knowledge of use of DME, GMC       Visit  Diagnosis: Muscle weakness (generalized)    Problem List Patient Active Problem List   Diagnosis Date Noted  . Acute appendicitis with appendiceal abscess 05/30/2018  . Hypocalcemia 02/15/2018  . Dysuria 02/15/2018  . Bilateral lower extremity edema 02/11/2018  . Vaginal yeast infection 01/30/2018  . Chest tightness 01/27/2018  . Healthcare-associated pneumonia 01/25/2018  . Pleural effusion, not elsewhere classified 01/25/2018  . Acute deep vein thrombosis (DVT) of left lower extremity ([place]) 01/24/2018  . Acute deep vein thrombosis (DVT) of right lower extremity ([place]) 01/24/2018  . Chronic allergic rhinitis 01/24/2018  . Depression with anxiety 01/24/2018  . UTI due to Klebsiella species 01/24/2018  . Protein-calorie malnutrition, severe ([place]) 01/24/2018  . S/P insertion of IVC (inferior vena caval) filter 01/24/2018  . Reactive depression   . Hypokalemia   . Leukocytosis   . Essential hypertension   . Trauma   . 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([place])   . Neuropathic pain   . Neurogenic bowel   . Neurogenic bladder   . Benign essential HTN   . Acute blood loss anemia   . Central cord syndrome at C6 level of cervical spinal cord ([place]) 11/29/2017  . Central cord syndrome [facility]) 11/29/2017    Harrel Carina, MS, OTR/L 01/09/2020, 9:38 PM  [place] MAIN [facility] SERVICES [address] [place], [place], [address] Phone: [phone]   Fax:  [phone]  Name: Sherry [name] MRN: [id number] Date of Birth: 10/14/1934

## 2020-01-14 ENCOUNTER — Ambulatory Visit: Payer: Medicare HMO | Admitting: Occupational Therapy

## 2020-01-14 ENCOUNTER — Other Ambulatory Visit: Payer: Self-pay

## 2020-01-14 ENCOUNTER — Ambulatory Visit: Payer: Medicare HMO | Admitting: Physical Therapy

## 2020-01-14 DIAGNOSIS — R278 Other lack of coordination: Secondary | ICD-10-CM

## 2020-01-14 DIAGNOSIS — R2689 Other abnormalities of gait and mobility: Secondary | ICD-10-CM

## 2020-01-14 DIAGNOSIS — M6281 Muscle weakness (generalized): Secondary | ICD-10-CM | POA: Diagnosis not present

## 2020-01-14 NOTE — Therapy (Signed)
[place] MAIN [facility] SERVICES [address] [place], [place], [address] Phone: [phone]   Fax:  [phone]  Physical Therapy Treatment/RECERTIFICATION  Patient Details  Name: Sherry [name] MRN: [id number] Date of Birth: [birth year] Referring Provider (PT): Viviana Simpler   Encounter Date: 01/14/2020   PT End of Session - 01/14/20 1337    Visit Number 27    Number of Visits 77    Date for PT Re-Evaluation 04/07/20    Authorization Type [employer] medicare FOTO performed by PT on eval (7/20), score 12    PT Start Time 1345    PT Stop Time 1435    PT Time Calculation (min) 50 min    Equipment Utilized During Treatment Other (comment)   lift   Activity Tolerance Patient tolerated treatment well;No increased pain    Behavior During Therapy WFL for tasks assessed/performed           Past Medical History:  Diagnosis Date  . Acute blood loss anemia   . Arthritis   . Cancer ([place])    skin  . Central cord syndrome at C6 level of cervical spinal cord ([place]) 11/29/2017  . Hypertension   . Protein-calorie malnutrition, severe ([place]) 01/24/2018  . S/P insertion of IVC (inferior vena caval) filter 01/24/2018  . 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[facility])     Past Surgical History:  Procedure Laterality Date  . ANTERIOR CERVICAL DECOMP/DISCECTOMY FUSION N/A 11/29/2017   Procedure: Cervical five-six, six-seven Anterior Cervical Decompression Fusion;  Surgeon: Judith Part, MD;  Location: [place];  Service: Neurosurgery;  Laterality: N/A;  Cervical five-six, six-seven Anterior Cervical Decompression Fusion  . CATARACT EXTRACTION    . IR IVC FILTER PLMT / S&I /IMG GUID/MOD SED  12/08/2017    There were no vitals filed for this visit.   Subjective Assessment - 01/14/20 1423    Subjective Patient states she had a great weekend. She denies of any pain or soreness since last therapy session.    Patient is accompained by: --   [name] (aide)   Pertinent History Pt is  an 85 yo female that fell in 2019, fractured vertebrae in her neck and in her low back per family. 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 After hospital stay, pt discharged to [facility] for ~1 month, experienced severe UTI as well as bilateral DVT (IVC filter placed). Discharged to [facility], stayed for about a year, and then moved to [facility] in April 2021. Was receiving PT, but per family facility reported that did not have adequate equipment to maximize PT for her. Pt until about 1 month ago was practicing sit to stand transfers with 1-2 people, and working on static standing. Has a brace for L foot due to PF. Pt currently needs assistance with all ADLs (able to assist with donning/doffing her shirt), bed baths, and needs a hoyer lift for transfers. Able to drive power wheelchair without assistance.    Limitations Standing;Walking;House hold activities;Lifting    How long can you sit comfortably? NA    How long can you stand comfortably? unable    How long can you walk comfortably? unable    Patient Stated Goals "to stand up and walk"    Currently in Pain? No/denies           Hoyer lift transfer from [employer]  Patient completed all exercises in today's session in unsupported sitting: Trunk twists with soft red ball x 20 Hip adduction with soft red ball squeezes x  20 Balloon toss in multidirection x 30 each arm  Ball taps x multidirection (emphasis on reaching) Ball toss in multidirection Weighted ball (red) going out and up x 20 reps, yellow x 5 reps Glut squeezes x 30 reps  Scapular squeezes x GTB x 15 reps each UE Modified PNF D2 flexion x GTB x 10 reps each UE Bicep curls x GTB x 15 reps each UE  Transfer from supine<>EOB requiring maxA for maneuvering BLE     Goals and outcome measures were reassessed and updated in today's therapy session. Patient reports that she has increased her strength since starting with therapy. She is noticing improvement in trunk  control and stability and has been able to sit unsupported for prolong period of time without the use of BUE support. Her FOTO score is consistent and is 18 today. She continues to require moderate assistance-maximum assistance with lateral scooting. Patient is not appropriate to attempt squat pivot transfers at this time. She is consistently complaint with her HEP. Patient is encouraged to continue to follow up as scheduled. Patient has not yet achieved maximum rehab potential from PT services and demonstrates ability to improvement. Patient will continue to benefit from skilled physical therapy to improve generalized strength, ROM, and capacity for functional activity.         PT Education - 01/14/20 1443    Person(s) Educated Patient    Methods Explanation;Verbal cues;Handout    Comprehension Verbalized understanding;Returned demonstration            PT Short Term Goals - 01/14/20 1454      PT SHORT TERM GOAL #1   Title The patient will perform initial HEP with minimum assistance in order to improve strength and function.    Time 4    Period Weeks    Status On-going    Target Date 02/11/20             PT Long Term Goals - 01/14/20 1457      PT LONG TERM GOAL #1   Title The patient will be compliant with finalized HEP with minimum assistance in preparation for self management and maintenance of condition.    Time 12    Period Weeks    Status On-going    Target Date 04/07/20      PT LONG TERM GOAL #2   Title The patient will demonstrate at least 10 point improvement on FOTO score indicating an improved ability to perform functional activities.    Baseline on eval (/720) score 12; 10/22/19: 12; 12/10/19: 18, 12/13: 18    Time 12    Period Weeks    Status On-going    Target Date 04/07/20      PT LONG TERM GOAL #3   Title The patient will demonstrate lateral scooting  for assistance with transfers with minA to maximize independence.    Baseline 10/22/19: Pt requires modA+1  for lateral scooting; 12/10/19: modA+1, 12/13: modA-maxA    Time 12    Period Weeks    Status On-going    Target Date 04/07/20      PT LONG TERM GOAL #4   Title The patient will demonstrate a squat pivot transfer with minimum assistance to maximize independence and mobility.    Baseline 10/22/19: unable to safely attempt at this time; 12/10/19: unable to safely attempt at this time, 01/14/20: unable to safety attempt at this time    Time 12    Period Weeks    Status On-going  Target Date 04/07/20      PT LONG TERM GOAL #5   Title The patient will demonstrate sitting without UE support for 2-5 minutes at EOB to improve participation and maximize independence with ADLs.    Baseline 10/22/19: Pt able ot sit unsupported for at least 2 minutes at edge of bed    Time 12    Period Weeks    Status Achieved                 Plan - 01/14/20 1452    Clinical Impression Statement Goals and outcome measures were reassessed and updated in today's therapy session. Patient reports that she has increased her strength since starting with therapy. She is noticing improvement in trunk control and stability and has been able to sit unsupported for prolong period of time without the use of BUE support. Her FOTO score is consistent and is 18 today. She continues to require moderate assistance-maximum assistance with lateral scooting. Patient is not appropriate to attempt squat pivot transfers at this time. She is consistently complaint with her HEP. Patient is encouraged to continue to follow up as scheduled. Patient has not yet achieved maximum rehab potential from PT services and demonstrates ability to improvement. Patient will continue to benefit from skilled physical therapy to improve generalized strength, ROM, and capacity for functional activity.    Personal Factors and Comorbidities Age;Time since onset of injury/illness/exacerbation;Comorbidity 3+;Fitness    Comorbidities HTN, quadriparesis, history  of DVT, neurogenic bladder    Examination-Activity Limitations Bathing;Hygiene/Grooming;Squat;Bed Mobility;Lift;Bend;Stand;Engineer, manufacturing;Toileting;Self Feeding;Transfers;Continence;Sit;Dressing;Sleep;Carry    Examination-Participation Restrictions Church;Laundry;Cleaning;Medication Management;Community Activity;Meal Prep;Interpersonal Relationship    Stability/Clinical Decision Making Evolving/Moderate complexity    Rehab Potential Fair    PT Frequency 2x / week    PT Duration 12 weeks    PT Treatment/Interventions ADLs/Self Care Home Management;Electrical Stimulation;Therapeutic activities;Wheelchair mobility training;Vasopneumatic Device;Joint Manipulations;Vestibular;Passive range of motion;Patient/family education;Therapeutic exercise;DME Instruction;Biofeedback;Aquatic Therapy;Moist Heat;Gait training;Balance training;Orthotic Fit/Training;Dry needling;Energy conservation;Taping;Splinting;Neuromuscular re-education;Cryotherapy;Ultrasound;Functional mobility training    PT Next Visit Plan sitting balance/endurance    PT Home Exercise Plan sitting balance unsupported hold 30-45sec with aide    Consulted and Agree with Plan of Care Patient    Family Member [place]           Patient will benefit from skilled therapeutic intervention in order to improve the following deficits and impairments:  Abnormal gait,Decreased balance,Decreased endurance,Decreased mobility,Difficulty walking,Hypomobility,Impaired sensation,Decreased range of motion,Improper body mechanics,Impaired perceived functional ability,Decreased activity tolerance,Decreased knowledge of use of DME,Decreased safety awareness,Decreased strength,Impaired flexibility,Impaired UE functional use,Postural dysfunction  Visit Diagnosis: Muscle weakness (generalized)  Other lack of coordination  Other abnormalities of gait and mobility     Problem List Patient Active Problem List   Diagnosis Date  Noted  . Acute appendicitis with appendiceal abscess 05/30/2018  . Hypocalcemia 02/15/2018  . Dysuria 02/15/2018  . Bilateral lower extremity edema 02/11/2018  . Vaginal yeast infection 01/30/2018  . Chest tightness 01/27/2018  . Healthcare-associated pneumonia 01/25/2018  . Pleural effusion, not elsewhere classified 01/25/2018  . Acute deep vein thrombosis (DVT) of left lower extremity ([place]) 01/24/2018  . Acute deep vein thrombosis (DVT) of right lower extremity ([place]) 01/24/2018  . Chronic allergic rhinitis 01/24/2018  . Depression with anxiety 01/24/2018  . UTI due to Klebsiella species 01/24/2018  . Protein-calorie malnutrition, severe ([place]) 01/24/2018  . S/P insertion of IVC (inferior vena caval) filter 01/24/2018  . Reactive depression   . Hypokalemia   . Leukocytosis   . Essential hypertension   .  Trauma   . 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([place])   . Neuropathic pain   . Neurogenic bowel   . Neurogenic bladder   . Benign essential HTN   . Acute blood loss anemia   . Central cord syndrome at C6 level of cervical spinal cord ([place]) 11/29/2017  . Central cord syndrome [facility]) 11/29/2017   Karl Luke PT, DPT Netta Corrigan 01/14/2020, 2:59 PM  [place] MAIN [facility] SERVICES [address] [place], [place], [address] Phone: [phone]   Fax:  [phone]  Name: Sherry [name] MRN: [id number] Date of Birth: 13-Dec-1934

## 2020-01-15 ENCOUNTER — Encounter: Payer: Self-pay | Admitting: Occupational Therapy

## 2020-01-15 NOTE — Therapy (Addendum)
[place] MAIN [facility] SERVICES [address] [place], [place], [address] Phone: [phone]   Fax:  [phone]  Occupational Therapy Treatment  Patient Details  Name: Sherry [name] MRN: [id number] Date of Birth: 03-02-1934 No data recorded  Encounter Date: 01/14/2020   OT End of Session - 01/15/20 0849    Visit Number 21    Number of Visits 24    Date for OT Re-Evaluation 02/27/20    OT Start Time 1300    OT Stop Time 1345    OT Time Calculation (min) 45 min    Equipment Utilized During Treatment moist heat    Activity Tolerance Patient tolerated treatment well    Behavior During Therapy [facility] for tasks assessed/performed           Past Medical History:  Diagnosis Date  . Acute blood loss anemia   . Arthritis   . Cancer ([place])    skin  . Central cord syndrome at C6 level of cervical spinal cord ([place]) 11/29/2017  . Hypertension   . Protein-calorie malnutrition, severe ([place]) 01/24/2018  . S/P insertion of IVC (inferior vena caval) filter 01/24/2018  . 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[facility])     Past Surgical History:  Procedure Laterality Date  . ANTERIOR CERVICAL DECOMP/DISCECTOMY FUSION N/A 11/29/2017   Procedure: Cervical five-six, six-seven Anterior Cervical Decompression Fusion;  Surgeon: Judith Part, MD;  Location: [place];  Service: Neurosurgery;  Laterality: N/A;  Cervical five-six, six-seven Anterior Cervical Decompression Fusion  . CATARACT EXTRACTION    . IR IVC FILTER PLMT / S&I /IMG GUID/MOD SED  12/08/2017    There were no vitals filed for this visit.   Subjective Assessment - 01/15/20 0847    Subjective  Pt.continues to work on completing her daily menus, and crossword puzzles.    Pertinent History Patient s/p fall November 29, 2017 resulting in diagnosis of central cord syndrome at C 6 level.  She has had therapy in multiple venues but with recent move to [facility], therapy staff recommended she seek outpatient therapy for her  needs.          OT Treatment  Pt.tolerated bilateral shoulder flexion, extension, abduction, elbow flexion, extension forearm supination AAROM/AROM. PROM bilateral wrist extension, digit MP, PIP, and DIP flexion, and extension in conjunction with moist heat.  Pt. worked on bilateral UE reaching with the right and Left UE using the horizontal [employer]. Pt. Worked on reaching with incorporating her core to improve trunk control while reaching up, and sliding them to the right through 3 horizontal rungs.   Pt. Is making progress. Pt. Required assist and repositioning for reaching the saebo rings. Pt.continues to engageher right hand whenperforming self-feeding with adpativeequipment.Pt. continues to be able to hold a specific wide width pen in her right hand, and formulate letters when positioned at the tabletop.Pt. reports that she is able to use her right thumb to hold items. Pt. tolerated ROM well without reports of pain, or discomfort. Pt.continues to benefit from working on improving ROM in order to work towards increasing engagement of her bilateral hands during ADLs, and IADL tasks.                        OT Education - 01/15/20 0848    Education Details UE ROM    Person(s) Educated Patient;Caregiver(s)    Comprehension Verbalized understanding;Need further instruction  OT Long Term Goals - 01/09/20 2134      OT LONG TERM GOAL #1   Title Patient and caregiver will demonstrate understanding of home exercise program for ROM.    Baseline Pt. has a restorative aide rehab aide assist her wih exercises at [facility]    Time 12    Period Weeks    Status On-going    Target Date 02/27/20      OT LONG TERM GOAL #2   Title Patient will demonstrate ability to don shirt with min assist from seated position.    Baseline indepedent with a larger shirt. Requires increased time to complete    Time 12    Period Weeks    Status On-going     Target Date 02/27/20      OT LONG TERM GOAL #3   Title Patient will demonstrate improved sitting balance at the edge of the bed to participate in self care tasks.    Baseline Pt. has an air mattress at [employer]    Time 12    Period Weeks    Status Deferred      OT LONG TERM GOAL #4   Title Patient will demonstrate improved composite finger flexion to hold deodorant to apply to underarms.    Baseline Pt. continues to present with improving finger flexion of R hand to use partial cylindrical grasp, but unable to grasp and hold items. Very limited digit flexion of L hand.    Time 12    Period Weeks    Status On-going    Target Date 02/27/20      OT LONG TERM GOAL #6   Title Patient will increase right UE ROM to comb the back of her hair with modified independence.    Baseline Pt. is able to comb the front, and sides of her head. Pt. can now reach the back, however cannot see what she is doing in the back of her head.    Time 12    Period Weeks    Status Partially Met    Target Date 02/27/20      OT LONG TERM GOAL #8   Title Pt. will write, and sign her name with 100% legibility, and modified independence.    Baseline Pt. is able to maintain grasp on a wide width pen. [place] to work on [employer].    Time 12    Period Weeks    Status On-going    Target Date 02/27/20                 Plan - 01/15/20 0849    Clinical Impression Statement Pt. Is making progress. Pt. Required assist and repositioning for reaching the saebo rings. Pt.continues to engageher right hand whenperforming self-feeding with adpativeequipment.Pt. continues to be able to hold a specific wide width pen in her right hand, and formulate letters when positioned at the tabletop.Pt. reports that she is able to use her right thumb to hold items. Pt. tolerated ROM well without reports of pain, or discomfort. Pt.continues to benefit from working on improving ROM in order to work towards increasing  engagement of her bilateral hands during ADLs, and IADL tasks.   OT Occupational Profile and History Detailed Assessment- Review of Records and additional review of physical, cognitive, psychosocial history related to current functional performance    Occupational performance deficits (Please refer to evaluation for details): ADL's;IADL's;Leisure;Social Participation    Body Structure / Function / Physical Skills ADL;Continence;Dexterity;Flexibility;Strength;ROM;Balance;Coordination;FMC;IADL;Endurance;UE functional use;Decreased knowledge  of use of [facility]    Psychosocial Skills Environmental  Adaptations;Habits;Routines and Behaviors    Rehab Potential Fair    Clinical Decision Making Limited treatment options, no task modification necessary    Comorbidities Affecting Occupational Performance: Presence of comorbidities impacting occupational performance    Comorbidities impacting occupational performance description: contractures of bilateral hands, dependent transfers,    Modification or Assistance to Complete Evaluation  No modification of tasks or assist necessary to complete eval    OT Frequency 2x / week    OT Duration 12 weeks    OT Treatment/Interventions Self-care/ADL training;Cryotherapy;Therapeutic exercise;DME and/or AE instruction;Balance training;Neuromuscular education;Manual Therapy;Splinting;Moist Heat;Passive range of motion;Therapeutic activities;Patient/family education    Consulted and Agree with Plan of Care Patient           Patient will benefit from skilled therapeutic intervention in order to improve the following deficits and impairments:   Body Structure / Function / Physical Skills: ADL,Continence,Dexterity,Flexibility,Strength,ROM,Balance,Coordination,FMC,IADL,Endurance,UE functional use,Decreased knowledge of use of [facility]   Psychosocial Skills: Environmental  Adaptations,Habits,Routines and Behaviors   Visit Diagnosis: Muscle weakness (generalized)  Other  lack of coordination    Problem List Patient Active Problem List   Diagnosis Date Noted  . Acute appendicitis with appendiceal abscess 05/30/2018  . Hypocalcemia 02/15/2018  . Dysuria 02/15/2018  . Bilateral lower extremity edema 02/11/2018  . Vaginal yeast infection 01/30/2018  . Chest tightness 01/27/2018  . Healthcare-associated pneumonia 01/25/2018  . Pleural effusion, not elsewhere classified 01/25/2018  . Acute deep vein thrombosis (DVT) of left lower extremity ([place]) 01/24/2018  . Acute deep vein thrombosis (DVT) of right lower extremity ([place]) 01/24/2018  . Chronic allergic rhinitis 01/24/2018  . Depression with anxiety 01/24/2018  . UTI due to Klebsiella species 01/24/2018  . Protein-calorie malnutrition, severe ([place]) 01/24/2018  . S/P insertion of IVC (inferior vena caval) filter 01/24/2018  . Reactive depression   . Hypokalemia   . Leukocytosis   . Essential hypertension   . Trauma   . 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([place])   . Neuropathic pain   . Neurogenic bowel   . Neurogenic bladder   . Benign essential HTN   . Acute blood loss anemia   . Central cord syndrome at C6 level of cervical spinal cord ([place]) 11/29/2017  . Central cord syndrome [facility]) 11/29/2017    Harrel Carina, MS, OTR/L 01/15/2020, 8:51 AM  [place] MAIN [facility] SERVICES [address] [place], [place], [address] Phone: [phone]   Fax:  [phone]  Name: Sherry [name] MRN: [id number] Date of Birth: February 29, 1934

## 2020-01-16 ENCOUNTER — Ambulatory Visit: Payer: Medicare HMO | Admitting: Physical Therapy

## 2020-01-16 ENCOUNTER — Ambulatory Visit: Payer: Medicare HMO | Admitting: Occupational Therapy

## 2020-01-16 ENCOUNTER — Other Ambulatory Visit: Payer: Self-pay

## 2020-01-16 DIAGNOSIS — R2689 Other abnormalities of gait and mobility: Secondary | ICD-10-CM

## 2020-01-16 DIAGNOSIS — R278 Other lack of coordination: Secondary | ICD-10-CM

## 2020-01-16 DIAGNOSIS — M6281 Muscle weakness (generalized): Secondary | ICD-10-CM

## 2020-01-16 DIAGNOSIS — R040 Epistaxis: Secondary | ICD-10-CM | POA: Diagnosis not present

## 2020-01-16 DIAGNOSIS — I82729 Chronic embolism and thrombosis of deep veins of unspecified upper extremity: Secondary | ICD-10-CM | POA: Diagnosis not present

## 2020-01-16 NOTE — Therapy (Signed)
[place] MAIN [facility] SERVICES [address] [place], [place], [address] Phone: [phone]   Fax:  [phone]  Physical Therapy Treatment  Patient Details  Name: Sherry [name] MRN: [id number] Date of Birth: 07-Sep-1934 Referring Provider (PT): Viviana Simpler   Encounter Date: 01/16/2020   PT End of Session - 01/16/20 1407    Visit Number 28    Number of Visits 54    Date for PT Re-Evaluation 04/07/20    Authorization Type [employer]na medicare FOTO performed by PT on eval (7/20), score 12    PT Start Time 1345    PT Stop Time 1430    PT Time Calculation (min) 45 min    Equipment Utilized During Treatment Other (comment)   lift   Activity Tolerance Patient tolerated treatment well;No increased pain    Behavior During Therapy WFL for tasks assessed/performed           Past Medical History:  Diagnosis Date  . Acute blood loss anemia   . Arthritis   . Cancer ([place])    skin  . Central cord syndrome at C6 level of cervical spinal cord ([place]) 11/29/2017  . Hypertension   . Protein-calorie malnutrition, severe ([place]) 01/24/2018  . S/P insertion of IVC (inferior vena caval) filter 01/24/2018  . 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[facility])     Past Surgical History:  Procedure Laterality Date  . ANTERIOR CERVICAL DECOMP/DISCECTOMY FUSION N/A 11/29/2017   Procedure: Cervical five-six, six-seven Anterior Cervical Decompression Fusion;  Surgeon: Judith Part, MD;  Location: [place];  Service: Neurosurgery;  Laterality: N/A;  Cervical five-six, six-seven Anterior Cervical Decompression Fusion  . CATARACT EXTRACTION    . IR IVC FILTER PLMT / S&I /IMG GUID/MOD SED  12/08/2017    There were no vitals filed for this visit.   Subjective Assessment - 01/16/20 1406    Subjective Patient denies of any pain or soreness since last therapy session.    Patient is accompained by: --   [name] (aide)   Pertinent History Pt is an 85 yo female that fell in 2019, fractured  vertebrae in her neck and in her low back per family. 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 After hospital stay, pt discharged to [facility] for ~1 month, experienced severe UTI as well as bilateral DVT (IVC filter placed). Discharged to [facility], stayed for about a year, and then moved to [facility] in April 2021. Was receiving PT, but per family facility reported that did not have adequate equipment to maximize PT for her. Pt until about 1 month ago was practicing sit to stand transfers with 1-2 people, and working on static standing. Has a brace for L foot due to PF. Pt currently needs assistance with all ADLs (able to assist with donning/doffing her shirt), bed baths, and needs a hoyer lift for transfers. Able to drive power wheelchair without assistance.    Limitations Standing;Walking;House hold activities;Lifting    How long can you sit comfortably? NA    How long can you stand comfortably? unable    How long can you walk comfortably? unable    Patient Stated Goals "to stand up and walk"    Currently in Pain? No/denies            Patient completed all exercises in today's session in unsupported sitting: Trunk twists with soft red ball x 20 Hip adduction with soft red ball squeezes x 20 Ball taps in multidirection x 30 each arm  Balloon toss  x multidirection (emphasis on reaching) Glut squeezes x 30 reps  Weighted ball (yellow) going out and up x 20 reps Scapular squeezes x GTB x 15 reps  Modified PNF D2 flexion x GTB x 10 reps each UE Bicep curls x GTB x 15 reps each UE Seated LAQ x 30 reps bilaterally    Transfer from supine<>EOB requiring maxA for maneuvering BLE          PT Short Term Goals - 01/14/20 1454      PT SHORT TERM GOAL #1   Title The patient will perform initial HEP with minimum assistance in order to improve strength and function.    Time 4    Period Weeks    Status On-going    Target Date 02/11/20             PT Long Term  Goals - 01/14/20 1457      PT LONG TERM GOAL #1   Title The patient will be compliant with finalized HEP with minimum assistance in preparation for self management and maintenance of condition.    Time 12    Period Weeks    Status On-going    Target Date 04/07/20      PT LONG TERM GOAL #2   Title The patient will demonstrate at least 10 point improvement on FOTO score indicating an improved ability to perform functional activities.    Baseline on eval (/720) score 12; 10/22/19: 12; 12/10/19: 18, 12/13: 18    Time 12    Period Weeks    Status On-going    Target Date 04/07/20      PT LONG TERM GOAL #3   Title The patient will demonstrate lateral scooting  for assistance with transfers with minA to maximize independence.    Baseline 10/22/19: Pt requires modA+1 for lateral scooting; 12/10/19: modA+1, 12/13: modA-maxA    Time 12    Period Weeks    Status On-going    Target Date 04/07/20      PT LONG TERM GOAL #4   Title The patient will demonstrate a squat pivot transfer with minimum assistance to maximize independence and mobility.    Baseline 10/22/19: unable to safely attempt at this time; 12/10/19: unable to safely attempt at this time, 01/14/20: unable to safety attempt at this time    Time 12    Period Weeks    Status On-going    Target Date 04/07/20      PT LONG TERM GOAL #5   Title The patient will demonstrate sitting without UE support for 2-5 minutes at EOB to improve participation and maximize independence with ADLs.    Baseline 10/22/19: Pt able ot sit unsupported for at least 2 minutes at edge of bed    Time 12    Period Weeks    Status Achieved                 Plan - 01/16/20 1457    Clinical Impression Statement Patient demonstrates improvement with strengthening exercises as evidenced by completing more trunk stabilization without muscle fatigue. Patient completed all exercises in unsupported seated position with good posture. She continues to show increased  weight bearing on right gluteal due to discomfort with weight bearing bilaterally. Therapy will continue to focus on improving strength, mobility, and functional activity tolerance to improve patient's quality of life.    Personal Factors and Comorbidities Age;Time since onset of injury/illness/exacerbation;Comorbidity 3+;Fitness    Comorbidities HTN, quadriparesis, history of DVT, neurogenic bladder  Examination-Activity Limitations Bathing;Hygiene/Grooming;Squat;Bed Mobility;Lift;Bend;Stand;Engineer, manufacturing;Toileting;Self Feeding;Transfers;Continence;Sit;Dressing;Sleep;Carry    Examination-Participation Restrictions Church;Laundry;Cleaning;Medication Management;Community Activity;Meal Prep;Interpersonal Relationship    Stability/Clinical Decision Making Evolving/Moderate complexity    Rehab Potential Fair    PT Frequency 2x / week    PT Duration 12 weeks    PT Treatment/Interventions ADLs/Self Care Home Management;Electrical Stimulation;Therapeutic activities;Wheelchair mobility training;Vasopneumatic Device;Joint Manipulations;Vestibular;Passive range of motion;Patient/family education;Therapeutic exercise;DME Instruction;Biofeedback;Aquatic Therapy;Moist Heat;Gait training;Balance training;Orthotic Fit/Training;Dry needling;Energy conservation;Taping;Splinting;Neuromuscular re-education;Cryotherapy;Ultrasound;Functional mobility training    PT Next Visit Plan sitting balance/endurance    PT Home Exercise Plan sitting balance unsupported hold 30-45sec with aide    Consulted and Agree with Plan of Care Patient    Family Member [place]           Patient will benefit from skilled therapeutic intervention in order to improve the following deficits and impairments:  Abnormal gait,Decreased balance,Decreased endurance,Decreased mobility,Difficulty walking,Hypomobility,Impaired sensation,Decreased range of motion,Improper body mechanics,Impaired perceived functional  ability,Decreased activity tolerance,Decreased knowledge of use of DME,Decreased safety awareness,Decreased strength,Impaired flexibility,Impaired UE functional use,Postural dysfunction  Visit Diagnosis: Muscle weakness (generalized)  Other lack of coordination  Other abnormalities of gait and mobility     Problem List Patient Active Problem List   Diagnosis Date Noted  . Acute appendicitis with appendiceal abscess 05/30/2018  . Hypocalcemia 02/15/2018  . Dysuria 02/15/2018  . Bilateral lower extremity edema 02/11/2018  . Vaginal yeast infection 01/30/2018  . Chest tightness 01/27/2018  . Healthcare-associated pneumonia 01/25/2018  . Pleural effusion, not elsewhere classified 01/25/2018  . Acute deep vein thrombosis (DVT) of left lower extremity ([place]) 01/24/2018  . Acute deep vein thrombosis (DVT) of right lower extremity ([place]) 01/24/2018  . Chronic allergic rhinitis 01/24/2018  . Depression with anxiety 01/24/2018  . UTI due to Klebsiella species 01/24/2018  . Protein-calorie malnutrition, severe ([place]) 01/24/2018  . S/P insertion of IVC (inferior vena caval) filter 01/24/2018  . Reactive depression   . Hypokalemia   . Leukocytosis   . Essential hypertension   . Trauma   . 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([place])   . Neuropathic pain   . Neurogenic bowel   . Neurogenic bladder   . Benign essential HTN   . Acute blood loss anemia   . Central cord syndrome at C6 level of cervical spinal cord ([place]) 11/29/2017  . Central cord syndrome [facility]) 11/29/2017   Karl Luke PT, DPT Netta Corrigan 01/16/2020, 3:08 PM  [place] MAIN [facility] SERVICES [address] [place], [place], [address] Phone: [phone]   Fax:  [phone]  Name: Sherry [name] MRN: [id number] Date of Birth: [birth year]

## 2020-01-17 ENCOUNTER — Encounter: Payer: Self-pay | Admitting: Occupational Therapy

## 2020-01-17 NOTE — Therapy (Signed)
[place] MAIN [facility] SERVICES [address] [place], [place], [address] Phone: [phone]   Fax:  [phone]  Occupational Therapy Treatment  Patient Details  Name: Sherry [name] MRN: [id number] Date of Birth: 1934-06-20 No data recorded  Encounter Date: 01/16/2020   OT End of Session - 01/17/20 0849    Visit Number 22    Number of Visits 24    Date for OT Re-Evaluation 02/27/20    OT Start Time 1300    OT Stop Time 1345    OT Time Calculation (min) 45 min    Activity Tolerance Patient tolerated treatment well    Behavior During Therapy [facility] for tasks assessed/performed           Past Medical History:  Diagnosis Date  . Acute blood loss anemia   . Arthritis   . Cancer ([place])    skin  . Central cord syndrome at C6 level of cervical spinal cord ([place]) 11/29/2017  . Hypertension   . Protein-calorie malnutrition, severe ([place]) 01/24/2018  . S/P insertion of IVC (inferior vena caval) filter 01/24/2018  . 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[facility])     Past Surgical History:  Procedure Laterality Date  . ANTERIOR CERVICAL DECOMP/DISCECTOMY FUSION N/A 11/29/2017   Procedure: Cervical five-six, six-seven Anterior Cervical Decompression Fusion;  Surgeon: Judith Part, MD;  Location: [place];  Service: Neurosurgery;  Laterality: N/A;  Cervical five-six, six-seven Anterior Cervical Decompression Fusion  . CATARACT EXTRACTION    . IR IVC FILTER PLMT / S&I /IMG GUID/MOD SED  12/08/2017    There were no vitals filed for this visit.   Subjective Assessment - 01/17/20 0849    Subjective  Pt.continues to work on completing her daily menus, and crossword puzzles.    Pertinent History Patient s/p fall November 29, 2017 resulting in diagnosis of central cord syndrome at C 6 level.  She has had therapy in multiple venues but with recent move to [facility], therapy staff recommended she seek outpatient therapy for her needs.    Currently in Pain? No/denies           OT Treatment  Pt.tolerated bilateral shoulder flexion, extension, abduction, elbow flexion, extension forearm supination AAROM/AROM. PROM bilateral wrist extension, digit MP, PIP, and DIP flexion, and extension in conjunction with moist heat.  Pt. worked on bilateral UE reaching with the right and Left UE using the horizontal [employer]. Pt. worked on reaching with incorporating her core to improve trunk control while reaching up, and sliding them to the right through 3 horizontal rungs.   Pt. continues to make steady progress. Pt. required assist and repositioning for reaching the saebo rings. Pt. was able to complete additional reps today. Pt.continues to engageher right hand whenperforming self-feeding with adpativeequipment.Pt. continues to be able to use her right thumb to hold items. Pt. tolerated ROM well without reports of pain, or discomfort. Pt.continues to benefit from working on improving ROM in order to work towards increasing engagement of her bilateral hands during ADLs, and IADL tasks.                      OT Education - 01/17/20 0849    Education Details UE ROM    Person(s) Educated Patient;Caregiver(s)    Methods Explanation;Demonstration    Comprehension Verbalized understanding;Need further instruction               OT Long Term Goals - 01/09/20 2134      OT  LONG TERM GOAL #1   Title Patient and caregiver will demonstrate understanding of home exercise program for ROM.    Baseline Pt. has a restorative aide rehab aide assist her wih exercises at [facility]    Time 12    Period Weeks    Status On-going    Target Date 02/27/20      OT LONG TERM GOAL #2   Title Patient will demonstrate ability to don shirt with min assist from seated position.    Baseline indepedent with a larger shirt. Requires increased time to complete    Time 12    Period Weeks    Status On-going    Target Date 02/27/20      OT LONG TERM GOAL #3   Title  Patient will demonstrate improved sitting balance at the edge of the bed to participate in self care tasks.    Baseline Pt. has an air mattress at [facility]    Time 12    Period Weeks    Status Deferred      OT LONG TERM GOAL #4   Title Patient will demonstrate improved composite finger flexion to hold deodorant to apply to underarms.    Baseline Pt. continues to present with improving finger flexion of R hand to use partial cylindrical grasp, but unable to grasp and hold items. Very limited digit flexion of L hand.    Time 12    Period Weeks    Status On-going    Target Date 02/27/20      OT LONG TERM GOAL #6   Title Patient will increase right UE ROM to comb the back of her hair with modified independence.    Baseline Pt. is able to comb the front, and sides of her head. Pt. can now reach the back, however cannot see what she is doing in the back of her head.    Time 12    Period Weeks    Status Partially Met    Target Date 02/27/20      OT LONG TERM GOAL #8   Title Pt. will write, and sign her name with 100% legibility, and modified independence.    Baseline Pt. is able to maintain grasp on a wide width pen. [place] to work on [employer].    Time 12    Period Weeks    Status On-going    Target Date 02/27/20                 Plan - 01/17/20 0850    Clinical Impression Statement Pt. continues to make steady progress. Pt. required assist and repositioning for reaching the saebo rings. Pt. was able to complete additional reps today. Pt.continues to engageher right hand whenperforming self-feeding with adpativeequipment.Pt. continues to be able to use her right thumb to hold items. Pt. tolerated ROM well without reports of pain, or discomfort. Pt.continues to benefit from working on improving ROM in order to work towards increasing engagement of her bilateral hands during ADLs, and IADL tasks.   OT Occupational Profile and History Detailed Assessment- Review  of Records and additional review of physical, cognitive, psychosocial history related to current functional performance    Occupational performance deficits (Please refer to evaluation for details): ADL's;IADL's;Leisure;Social Participation    Body Structure / Function / Physical Skills ADL;Continence;Dexterity;Flexibility;Strength;ROM;Balance;Coordination;FMC;IADL;Endurance;UE functional use;Decreased knowledge of use of DME;GMC    Psychosocial Skills Environmental  Adaptations;Habits;Routines and Behaviors    Rehab Potential Fair    Clinical Decision Making Limited  treatment options, no task modification necessary    Comorbidities Affecting Occupational Performance: Presence of comorbidities impacting occupational performance    Comorbidities impacting occupational performance description: contractures of bilateral hands, dependent transfers,    Modification or Assistance to Complete Evaluation  No modification of tasks or assist necessary to complete eval    OT Frequency 2x / week    OT Duration 12 weeks    OT Treatment/Interventions Self-care/ADL training;Cryotherapy;Therapeutic exercise;DME and/or AE instruction;Balance training;Neuromuscular education;Manual Therapy;Splinting;Moist Heat;Passive range of motion;Therapeutic activities;Patient/family education    Consulted and Agree with Plan of Care Patient           Patient will benefit from skilled therapeutic intervention in order to improve the following deficits and impairments:   Body Structure / Function / Physical Skills: ADL,Continence,Dexterity,Flexibility,Strength,ROM,Balance,Coordination,FMC,IADL,Endurance,UE functional use,Decreased knowledge of use of [facility]   Psychosocial Skills: Environmental  Adaptations,Habits,Routines and Behaviors   Visit Diagnosis: Muscle weakness (generalized)  Other lack of coordination    Problem List Patient Active Problem List   Diagnosis Date Noted  . Acute appendicitis with  appendiceal abscess 05/30/2018  . Hypocalcemia 02/15/2018  . Dysuria 02/15/2018  . Bilateral lower extremity edema 02/11/2018  . Vaginal yeast infection 01/30/2018  . Chest tightness 01/27/2018  . Healthcare-associated pneumonia 01/25/2018  . Pleural effusion, not elsewhere classified 01/25/2018  . Acute deep vein thrombosis (DVT) of left lower extremity ([place]) 01/24/2018  . Acute deep vein thrombosis (DVT) of right lower extremity ([place]) 01/24/2018  . Chronic allergic rhinitis 01/24/2018  . Depression with anxiety 01/24/2018  . UTI due to Klebsiella species 01/24/2018  . Protein-calorie malnutrition, severe ([place]) 01/24/2018  . S/P insertion of IVC (inferior vena caval) filter 01/24/2018  . Reactive depression   . Hypokalemia   . Leukocytosis   . Essential hypertension   . Trauma   . 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([place])   . Neuropathic pain   . Neurogenic bowel   . Neurogenic bladder   . Benign essential HTN   . Acute blood loss anemia   . Central cord syndrome at C6 level of cervical spinal cord ([place]) 11/29/2017  . Central cord syndrome [facility]) 11/29/2017    Harrel Carina, MS, OTR/L 01/17/2020, 8:51 AM  [place] MAIN [facility] SERVICES [address] [place], [place], [address] Phone: [phone]   Fax:  [phone]  Name: FLORENCE YEUNG MRN: [id number] Date of Birth: 11-29-1934

## 2020-01-20 ENCOUNTER — Emergency Department
Admission: EM | Admit: 2020-01-20 | Discharge: 2020-01-21 | Disposition: A | Discharge status: Home / Self Care | Payer: Medicare HMO | Attending: Emergency Medicine | Admitting: Emergency Medicine

## 2020-01-20 ENCOUNTER — Other Ambulatory Visit: Payer: Self-pay

## 2020-01-20 DIAGNOSIS — Z85828 Personal history of other malignant neoplasm of skin: Secondary | ICD-10-CM | POA: Diagnosis not present

## 2020-01-20 DIAGNOSIS — Z79899 Other long term (current) drug therapy: Secondary | ICD-10-CM | POA: Diagnosis not present

## 2020-01-20 DIAGNOSIS — I10 Essential (primary) hypertension: Secondary | ICD-10-CM | POA: Insufficient documentation

## 2020-01-20 DIAGNOSIS — R112 Nausea with vomiting, unspecified: Secondary | ICD-10-CM | POA: Insufficient documentation

## 2020-01-20 DIAGNOSIS — R109 Unspecified abdominal pain: Secondary | ICD-10-CM | POA: Diagnosis present

## 2020-01-20 DIAGNOSIS — K5900 Constipation, unspecified: Secondary | ICD-10-CM | POA: Diagnosis not present

## 2020-01-20 LAB — CBC WITH DIFFERENTIAL/PLATELET
Abs Immature Granulocytes: 0.05 10*3/uL (ref 0.00–0.07)
Basophils Absolute: 0.1 10*3/uL (ref 0.0–0.1)
Basophils Relative: 1 %
Eosinophils Absolute: 0.4 10*3/uL (ref 0.0–0.5)
Eosinophils Relative: 3 %
HCT: 37.9 % (ref 36.0–46.0)
Hemoglobin: 12.7 g/dL (ref 12.0–15.0)
Immature Granulocytes: 0 %
Lymphocytes Relative: 27 %
Lymphs Abs: 3.5 10*3/uL (ref 0.7–4.0)
MCH: 29.2 pg (ref 26.0–34.0)
MCHC: 33.5 g/dL (ref 30.0–36.0)
MCV: 87.1 fL (ref 80.0–100.0)
Monocytes Absolute: 0.9 10*3/uL (ref 0.1–1.0)
Monocytes Relative: 7 %
Neutro Abs: 8 10*3/uL — ABNORMAL HIGH (ref 1.7–7.7)
Neutrophils Relative %: 62 %
Platelets: 334 10*3/uL (ref 150–400)
RBC: 4.35 MIL/uL (ref 3.87–5.11)
RDW: 14.6 % (ref 11.5–15.5)
WBC: 12.8 10*3/uL — ABNORMAL HIGH (ref 4.0–10.5)
nRBC: 0 % (ref 0.0–0.2)

## 2020-01-20 MED ORDER — ONDANSETRON HCL 4 MG/2ML IJ SOLN
4.0000 mg | Freq: Once | INTRAMUSCULAR | Status: AC
  Administered 2020-01-20: 4 mg via INTRAVENOUS
  Filled 2020-01-20: qty 2

## 2020-01-20 MED ORDER — FENTANYL CITRATE (PF) 100 MCG/2ML IJ SOLN
25.0000 ug | Freq: Once | INTRAMUSCULAR | Status: AC
  Administered 2020-01-20: 25 ug via INTRAVENOUS
  Filled 2020-01-20: qty 2

## 2020-01-20 MED ORDER — SODIUM CHLORIDE 0.9 % IV BOLUS
500.0000 mL | Freq: Once | INTRAVENOUS | Status: AC
  Administered 2020-01-20: 500 mL via INTRAVENOUS

## 2020-01-20 MED ORDER — IOHEXOL 9 MG/ML PO SOLN: 500.0000 mL | Freq: Two times a day (BID) | ORAL | Status: DC | PRN

## 2020-01-20 NOTE — ED Triage Notes (Signed)
EMS reports bowel movements are normal.  Indwelling foley last changed 2 weeks ago.Patient bedbound

## 2020-01-20 NOTE — ED Provider Notes (Signed)
[facility] Emergency Department Provider Note   ____________________________________________   Event Date/Time   First MD Initiated Contact with Patient 01/20/20 2311     (approximate)  I have reviewed the triage vital signs and the nursing notes.   HISTORY  Chief Complaint Abdominal pain   HPI Sherry [name] is a 85 y.o. female brought to the ED via EMS from [facility] with a chief complaint of abdominal pain.  Patient with a history of hypertension, central cord syndrome, neurogenic bladder with indwelling Foley catheter who reports a 1 day history of generalized abdominal pain with bloating.  Denies associated fever, cough, chest pain, shortness of breath, nausea, vomiting, constipation or diarrhea.  Last BM yesterday which was normal for patient.  Patient is bedbound.     Past Medical History:  Diagnosis Date  . Acute blood loss anemia   . Arthritis   . Cancer ([place])    skin  . Central cord syndrome at C6 level of cervical spinal cord ([place]) 11/29/2017  . Hypertension   . Protein-calorie malnutrition, severe ([place]) 01/24/2018  . S/P insertion of IVC (inferior vena caval) filter 01/24/2018  . 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[facility])     Patient Active Problem List   Diagnosis Date Noted  . Acute appendicitis with appendiceal abscess 05/30/2018  . Hypocalcemia 02/15/2018  . Dysuria 02/15/2018  . Bilateral lower extremity edema 02/11/2018  . Vaginal yeast infection 01/30/2018  . Chest tightness 01/27/2018  . Healthcare-associated pneumonia 01/25/2018  . Pleural effusion, not elsewhere classified 01/25/2018  . Acute deep vein thrombosis (DVT) of left lower extremity ([place]) 01/24/2018  . Acute deep vein thrombosis (DVT) of right lower extremity ([place]) 01/24/2018  . Chronic allergic rhinitis 01/24/2018  . Depression with anxiety 01/24/2018  . UTI due to Klebsiella species 01/24/2018  . Protein-calorie malnutrition, severe ([place]) 01/24/2018  . S/P insertion of IVC  (inferior vena caval) filter 01/24/2018  . Reactive depression   . Hypokalemia   . Leukocytosis   . Essential hypertension   . Trauma   . 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([place])   . Neuropathic pain   . Neurogenic bowel   . Neurogenic bladder   . Benign essential HTN   . Acute blood loss anemia   . Central cord syndrome at C6 level of cervical spinal cord ([place]) 11/29/2017  . Central cord syndrome ([place]) 11/29/2017    Past Surgical History:  Procedure Laterality Date  . ANTERIOR CERVICAL DECOMP/DISCECTOMY FUSION N/A 11/29/2017   Procedure: Cervical five-six, six-seven Anterior Cervical Decompression Fusion;  Surgeon: Judith Part, MD;  Location: [place];  Service: Neurosurgery;  Laterality: N/A;  Cervical five-six, six-seven Anterior Cervical Decompression Fusion  . CATARACT EXTRACTION    . IR IVC FILTER PLMT / S&I /IMG GUID/MOD SED  12/08/2017    Prior to Admission medications   Medication Sig Start Date End Date Taking? Authorizing Provider  acetaminophen (TYLENOL) 325 MG tablet Take 650 mg by mouth every 6 (six) hours.  01/20/18   [provider]  Alpha-D-Galactosidase (BEANO) TABS Take 150 mg by mouth 3 (three) times daily with meals as needed (Bloating). Give 2 tabs = 300 mg    [provider]  alum & mag hydroxide-simeth (MAALOX PLUS) 400-400-40 MG/5ML suspension Take 30 mLs by mouth every 4 (four) hours as needed for indigestion.    [provider]  amLODipine (NORVASC) 10 MG tablet Take 10 mg by mouth daily after supper.     [provider]  bisacodyl (DULCOLAX) 10 MG  suppository Place 10 mg rectally daily as needed for moderate constipation. 01/24/18   [provider]  calcium carbonate (TUMS EX) 750 MG chewable tablet Chew 1 tablet by mouth 3 (three) times daily. 02/10/18   [provider]  Capsaicin 0.1 % CREA Apply 1 application topically 2 (two) times daily. Apply to left shoulder, bilateral hands, and knees    [provider]   DULoxetine (CYMBALTA) 60 MG capsule Take 60 mg by mouth daily.  02/01/18   [provider]  gabapentin (NEURONTIN) 400 MG capsule Take 300 mg by mouth every 6 (six) hours. Also takes 600MG  by mouth at bedtime for neurogenic bladder 01/20/18   [provider]  lactulose (CHRONULAC) 10 GM/15ML solution Take 30 mLs (20 g total) by mouth daily as needed for mild constipation. 01/21/20   Paulette Blanch, MD  loratadine (CLARITIN) 10 MG tablet Take 10 mg by mouth daily.  01/19/18   [provider]  magnesium hydroxide (MILK OF MAGNESIA) 400 MG/5ML suspension Take 30 mLs by mouth every 4 (four) hours as needed for mild constipation.    [provider]  metoCLOPramide (REGLAN) 5 MG tablet Take 1 tablet (5 mg total) by mouth every 8 (eight) hours as needed for nausea. Patient not taking: Reported on 11/06/2018 09/27/18 09/27/19  Merlyn Lot, MD  Multiple Vitamin (MULTIVITAMIN WITH MINERALS) TABS tablet Take 1 tablet by mouth daily.    [provider]  NON FORMULARY Diet Type:  Regular - NO BEEF, PORK OR DAIRY PRODUCTS    [provider]  nortriptyline (PAMELOR) 25 MG capsule Take 25 mg by mouth at bedtime. 01/20/18   [provider]  Ostomy Supplies (SKIN PREP WIPES) MISC Apply to both heals daily 01/20/18   [provider]  oxyCODONE (OXY IR/ROXICODONE) 5 MG immediate release tablet Take 0.5 tablets (2.5 mg total) by mouth every 12 hours as needed for severe pain. 03/29/18   Medina-Vargas, Monina C, NP  Probiotic Product (RISA-BID PROBIOTIC) TABS Take 1 tablet by mouth daily.  02/04/18   [provider]  rivaroxaban (XARELTO) 20 MG TABS tablet Take 1 tablet (20 mg total) by mouth daily with supper. Patient taking differently: Take 10 mg by mouth daily with supper.  01/18/18   Angiulli, Lavon Paganini, PA-C  sennosides-docusate sodium (SENOKOT-S) 8.6-50 MG tablet Take 2 tablets by mouth 2 (two) times a day.    [provider]   Skin Protectants, Misc. (ENDIT EX) Apply topically to bottom / peri-area after preforming peri-care 01/22/18   [provider]  sodium phosphate (FLEET) 7-19 GM/118ML ENEM Place 1 enema rectally daily as needed for severe constipation.    [provider]  spironolactone (ALDACTONE) 25 MG tablet Take 25 mg by mouth 2 (two) times daily.  03/08/18   [provider]  tizanidine (ZANAFLEX) 2 MG capsule Take 2 mg by mouth every 8 (eight) hours.  03/03/18   [provider]    Allergies Other, Lisinopril-hydrochlorothiazide, Beef-derived products, Dairy aid [lactase], Lisinopril-hydrochlorothiazide, and Pork-derived products  Family History  Problem Relation Age of Onset  . Breast cancer Maternal Aunt 75    Social History Social History   Tobacco Use  . Smoking status: Never Smoker  . Smokeless tobacco: Never Used  Vaping Use  . Vaping Use: Never used  Substance Use Topics  . Alcohol use: Never  . Drug use: Never    Review of Systems  Constitutional: No fever/chills Eyes: No visual changes. ENT: No sore throat.  Cardiovascular: Denies chest pain. Respiratory: Denies shortness of breath. Gastrointestinal: Positive for abdominal pain with bloating.  No nausea, no vomiting.  No diarrhea.  No constipation. Genitourinary: Negative for dysuria. Musculoskeletal: Negative for back pain. Skin: Negative for rash. Neurological: Negative for headaches, focal weakness or numbness.   ____________________________________________   PHYSICAL EXAM:  VITAL SIGNS: ED Triage Vitals  Enc Vitals Group     BP      Pulse      Resp      Temp      Temp src      SpO2      Weight      Height      Head Circumference      Peak Flow      Pain Score      Pain Loc      Pain Edu?      Excl. in [place]?     Constitutional: Alert and oriented.  Elderly appearing and in mild acute distress. Eyes: Conjunctivae are normal. PERRL. EOMI. Head: Atraumatic. Nose: No  congestion/rhinnorhea. Mouth/Throat: Mucous membranes are mildly dry.   Neck: No stridor.   Cardiovascular: Normal rate, regular rhythm. Grossly normal heart sounds.  Good peripheral circulation. Respiratory: Normal respiratory effort.  No retractions. Lungs CTAB. Gastrointestinal: Soft with mild diffuse tenderness to palpation without rebound or guarding.  Mild distention.  Hyperactive bowel sounds.  No abdominal bruits. No CVA tenderness. Genitourinary: Positive for indwelling Foley catheter. Musculoskeletal: No lower extremity tenderness nor edema.  No joint effusions. Neurologic:  Normal speech and language. No gross focal neurologic deficits are appreciated.  Skin:  Skin is warm, dry and intact. No rash noted. Psychiatric: Mood and affect are normal. Speech and behavior are normal.  ____________________________________________   LABS (all labs ordered are listed, but only abnormal results are displayed)  Labs Reviewed  CBC WITH DIFFERENTIAL/PLATELET - Abnormal; Notable for the following components:      Result Value   WBC 12.8 (*)    Neutro Abs 8.0 (*)    All other components within normal limits  URINALYSIS, COMPLETE (UACMP) WITH MICROSCOPIC - Abnormal; Notable for the following components:   Color, Urine AMBER (*)    APPearance CLOUDY (*)    Protein, ur 100 (*)    Leukocytes,Ua LARGE (*)    WBC, UA >50 (*)    Bacteria, UA RARE (*)    All other components within normal limits  URINE CULTURE  CULTURE, BLOOD (ROUTINE X 2)  CULTURE, BLOOD (ROUTINE X 2)  COMPREHENSIVE METABOLIC PANEL  LIPASE, BLOOD  LACTIC ACID, PLASMA  PROCALCITONIN  LACTIC ACID, PLASMA  TROPONIN I (HIGH SENSITIVITY)  TROPONIN I (HIGH SENSITIVITY)   ____________________________________________  EKG  ED ECG REPORT I, Zuma Hust J, the attending physician, personally viewed and interpreted this ECG.   Date: 01/20/2020  EKG Time: 0031  Rate: 83  Rhythm: normal EKG, normal sinus rhythm  Axis:  Normal  Intervals:none  ST&T Change: Nonspecific  ____________________________________________  RADIOLOGY I, Tonnette Zwiebel J, personally viewed and evaluated these images (plain radiographs) as part of my medical decision making, as well as reviewing the written report by the radiologist.  ED MD interpretation: Large stool burden without SBO  Official radiology report(s): CT Abdomen Pelvis W Contrast  Result Date: 01/21/2020 CLINICAL DATA:  Abdominal distension. EXAM: CT ABDOMEN AND PELVIS WITH CONTRAST TECHNIQUE: Multidetector CT imaging of the abdomen and pelvis was performed using the standard protocol following bolus administration of intravenous contrast. CONTRAST:  161mL OMNIPAQUE IOHEXOL  300 MG/ML  SOLN COMPARISON:  September 27, 2018 FINDINGS: Lower chest: Mild linear atelectasis is seen within the posteromedial aspect of the left lung base. Hepatobiliary: No focal liver abnormality is seen. No gallstones, gallbladder wall thickening, or biliary dilatation. Pancreas: Unremarkable. No pancreatic ductal dilatation or surrounding inflammatory changes. Spleen: Normal in size without focal abnormality. Adrenals/Urinary Tract: Adrenal glands are unremarkable. Kidneys are normal in size. A 1.5 cm simple cyst is seen within the medial aspect of the mid to lower right kidney. Additional subcentimeter cystic appearing areas are seen within this region. A 2.3 cm diameter simple cyst is seen within the medial aspect of the mid to upper left kidney. Mild, stable right-sided hydronephrosis is seen without evidence of renal calculi. A Foley catheter is seen within a nearly empty urinary bladder. Stomach/Bowel: There is a small hiatal hernia. The appendix is normal. No evidence of bowel dilatation. A very large amount of stool is seen throughout the colon. Vascular/Lymphatic: There is moderate to marked severity aortic calcification and atherosclerosis. An inferior vena cava filter is in place. No enlarged abdominal  or pelvic lymph nodes. Reproductive: Partially calcified uterine fibroids are noted. The bilateral adnexa are unremarkable. Other: No abdominal wall hernia or abnormality. No abdominopelvic ascites. Musculoskeletal: Multilevel degenerative changes are seen throughout the lumbar spine. IMPRESSION: 1. Very large stool burden without evidence of bowel obstruction. 2. Small hiatal hernia. 3. Calcified uterine fibroids. 4. IVC filter. 5. Bilateral simple renal cysts. 6. Aortic atherosclerosis. Aortic Atherosclerosis (ICD10-I70.0). Electronically Signed   By: Virgina Norfolk M.D.   On: 01/21/2020 01:52    ____________________________________________   PROCEDURES  Procedure(s) performed (including Critical Care):  .1-3 Lead EKG Interpretation Performed by: Paulette Blanch, MD Authorized by: Paulette Blanch, MD     Interpretation: normal     ECG rate:  80   ECG rate assessment: normal     Rhythm: sinus rhythm     Ectopy: none     Conduction: normal   Comments:     Placed on cardiac monitor to evaluate for arrhythmias     ____________________________________________   INITIAL IMPRESSION / ASSESSMENT AND PLAN / ED COURSE  As part of my medical decision making, I reviewed the following data within the [place] notes reviewed and incorporated, Labs reviewed, EKG interpreted, Old chart reviewed (05/2018 hospitalization for acute appendicitis with fecal impaction), Radiograph reviewed and Notes from prior ED visits     85 year old female presenting with generalized abdominal pain and bloating. Differential diagnosis includes, but is not limited to, ovarian cyst, ovarian torsion, acute appendicitis, diverticulitis, urinary tract infection/pyelonephritis, endometriosis, bowel obstruction, colitis, renal colic, gastroenteritis, hernia, fibroids, etc.  We will obtain basic lab work, UA with culture from Foley catheter, CT abdomen/pelvis to evaluate etiology of patient's  symptoms.  Initiate IV hydration, IV fentanyl for pain paired with IV Zofran for nausea.  I personally reviewed patient's old chart and see that she had an admission 05/2018 for acute appendicitis with phlegmon and fecal impaction.  She was manually disimpacted, treated with IV antibiotics with resolution of her pain without surgical intervention.   Clinical Course as of 01/21/20 6222  Sun Jan 20, 2020  2350 Nursing checked Foley catheter which was placed 2 weeks ago and perform bladder scan; Foley appears to be working condition with less than 80 mL in the bladder. [JS]  Mon Jan 21, 2020  0303 Delay due to other critical patients.  Updated patient and daughter who is at  bedside.  As expected, UA from Foley catheter appears infected.  However, lactic acid and procalcitonin are both negative.  Will administer 1 dose IV antibiotic and await urine culture results for continuation.  Did offer soapsuds enema for large stool burden which patient accepts. [JS]  0510 Enema was held because patient was having a small bowel movement.  States she still feels bloated and would like to proceed with the enema. [JS]  [id number] Patient had large satisfactory BM.  Will discharge back to facility with prescription for Lactulose to use as needed.  Strict return precautions given.  Both patient and daughter verbalized understanding and agree with plan of care. [JS]    Clinical Course User Index [JS] Paulette Blanch, MD     ____________________________________________   FINAL CLINICAL IMPRESSION(S) / ED DIAGNOSES  Final diagnoses:  Abdominal pain, unspecified abdominal location  Constipation, unspecified constipation type     ED Discharge Orders         Ordered    lactulose ([place]) 10 GM/15ML solution  Daily PRN        01/21/20 0314          *Please note:  Sherry [name] was evaluated in Emergency Department on 01/21/2020 for the symptoms described in the history of present illness. She was evaluated  in the context of the global COVID-19 pandemic, which necessitated consideration that the patient might be at risk for infection with the SARS-CoV-2 virus that causes COVID-19. Institutional protocols and algorithms that pertain to the evaluation of patients at risk for COVID-19 are in a state of rapid change based on information released by regulatory bodies including the CDC and federal and state organizations. These policies and algorithms were followed during the patient's care in the ED.  Some ED evaluations and interventions may be delayed as a result of limited staffing during and the pandemic.*   Note:  This document was prepared using Dragon voice recognition software and may include unintentional dictation errors.   Paulette Blanch, MD 01/21/20 [phone]

## 2020-01-21 ENCOUNTER — Other Ambulatory Visit: Payer: Self-pay

## 2020-01-21 ENCOUNTER — Emergency Department: Payer: Medicare HMO

## 2020-01-21 ENCOUNTER — Encounter: Payer: Self-pay | Admitting: Radiology

## 2020-01-21 LAB — URINE CULTURE

## 2020-01-21 LAB — URINALYSIS, COMPLETE (UACMP) WITH MICROSCOPIC
Bilirubin Urine: NEGATIVE
Glucose, UA: NEGATIVE mg/dL
Hgb urine dipstick: NEGATIVE
Ketones, ur: NEGATIVE mg/dL
Nitrite: NEGATIVE
Protein, ur: 100 mg/dL — AB
Specific Gravity, Urine: 1.016 (ref 1.005–1.030)
Squamous Epithelial / HPF: NONE SEEN (ref 0–5)
WBC, UA: >50 WBC/hpf — ABNORMAL HIGH (ref 0–5)
pH: 8 (ref 5.0–8.0)

## 2020-01-21 LAB — COMPREHENSIVE METABOLIC PANEL
ALT: 22 U/L (ref 0–44)
AST: 28 U/L (ref 15–41)
Albumin: 4 g/dL (ref 3.5–5.0)
Alkaline Phosphatase: 61 U/L (ref 38–126)
Anion gap: 10 (ref 5–15)
BUN: 13 mg/dL (ref 8–23)
CO2: 28 mmol/L (ref 22–32)
Calcium: 9.5 mg/dL (ref 8.9–10.3)
Chloride: 98 mmol/L (ref 98–111)
Creatinine, Ser: 0.58 mg/dL (ref 0.44–1.00)
GFR, Estimated: >60 mL/min (ref >60)
Glucose, Bld: 89 mg/dL (ref 70–99)
Potassium: 3.8 mmol/L (ref 3.5–5.1)
Sodium: 136 mmol/L (ref 135–145)
Total Bilirubin: 0.7 mg/dL (ref 0.3–1.2)
Total Protein: 7.2 g/dL (ref 6.5–8.1)

## 2020-01-21 LAB — LIPASE, BLOOD: Lipase: 43 U/L (ref 11–51)

## 2020-01-21 LAB — TROPONIN I (HIGH SENSITIVITY)
Troponin I (High Sensitivity): 4 ng/L (ref <18)
Troponin I (High Sensitivity): 5 ng/L (ref <18)

## 2020-01-21 LAB — LACTIC ACID, PLASMA: Lactic Acid, Venous: 1.4 mmol/L (ref 0.5–1.9)

## 2020-01-21 LAB — PROCALCITONIN: Procalcitonin: <0.1 ng/mL

## 2020-01-21 MED ORDER — SODIUM CHLORIDE 0.9 % IV SOLN
1.0000 g | Freq: Once | INTRAVENOUS | Status: AC
  Administered 2020-01-21: 04:00:00 1 g via INTRAVENOUS
  Filled 2020-01-21: qty 10

## 2020-01-21 MED ORDER — IOHEXOL 300 MG/ML  SOLN
100.0000 mL | Freq: Once | INTRAMUSCULAR | Status: AC | PRN
  Administered 2020-01-21: 01:00:00 100 mL via INTRAVENOUS

## 2020-01-21 MED ORDER — DOCUSATE SODIUM 100 MG PO CAPS
100.0000 mg | ORAL_CAPSULE | Freq: Once | ORAL | Status: AC
  Administered 2020-01-21: 06:00:00 100 mg via ORAL

## 2020-01-21 MED ORDER — LACTULOSE 10 GM/15ML PO SOLN
30.0000 g | Freq: Once | ORAL | Status: AC
  Administered 2020-01-21: 04:00:00 30 g via ORAL
  Filled 2020-01-21: qty 60

## 2020-01-21 MED ORDER — LACTULOSE 10 GM/15ML PO SOLN: 20.0000 g | Freq: Every day | ORAL | 0 refills | Status: DC | PRN

## 2020-01-21 MED ORDER — MAGNESIUM CITRATE PO SOLN
1.0000 | Freq: Once | ORAL | Status: AC
  Administered 2020-01-21: 06:00:00 1 via ORAL

## 2020-01-21 NOTE — ED Notes (Signed)
Assumed care of patient awaiting transport back to facility. Resting comfortably, respirations even and unlabored. NAD noted

## 2020-01-21 NOTE — ED Notes (Signed)
Pink elephant enema given at this time.  Moderate amount of soft stool expelled.  Pt cleaned of stool and clean pad placed under pt.

## 2020-01-21 NOTE — ED Notes (Signed)
Pt cleaned of stool. Chucks changed

## 2020-01-21 NOTE — Discharge Instructions (Addendum)
Urine culture is pending.  We will notify you of any positive results requiring antibiotics.  You may take Lactulose as needed for bowel movements.  Return to the ER for worsening symptoms, persistent vomiting, difficulty breathing or other concerns.

## 2020-01-22 DIAGNOSIS — K592 Neurogenic bowel, not elsewhere classified: Secondary | ICD-10-CM | POA: Diagnosis not present

## 2020-01-22 DIAGNOSIS — I700 Atherosclerosis of aorta: Secondary | ICD-10-CM | POA: Diagnosis not present

## 2020-01-23 ENCOUNTER — Ambulatory Visit: Payer: Medicare HMO | Admitting: Physical Therapy

## 2020-01-23 ENCOUNTER — Ambulatory Visit: Payer: Medicare HMO | Admitting: Occupational Therapy

## 2020-01-23 ENCOUNTER — Other Ambulatory Visit: Payer: Self-pay

## 2020-01-23 DIAGNOSIS — R278 Other lack of coordination: Secondary | ICD-10-CM

## 2020-01-23 DIAGNOSIS — M6281 Muscle weakness (generalized): Secondary | ICD-10-CM

## 2020-01-23 DIAGNOSIS — R2689 Other abnormalities of gait and mobility: Secondary | ICD-10-CM

## 2020-01-23 NOTE — Therapy (Signed)
[place] MAIN [facility] SERVICES [address] [place], [place], [address] Phone: [phone]   Fax:  [phone]  Physical Therapy Treatment  Patient Details  Name: Sherry [name] MRN: [id number] Date of Birth: 03-19-1934 Referring Provider (PT): Viviana Simpler   Encounter Date: 01/23/2020   PT End of Session - 01/23/20 1720    Visit Number 29    Number of Visits 54    Date for PT Re-Evaluation 04/07/20    Authorization Type aetna medicare FOTO performed by PT on eval (7/20), score 12    PT Start Time 1345    PT Stop Time 1430    PT Time Calculation (min) 45 min    Equipment Utilized During Treatment Other (comment)   lift   Activity Tolerance Patient tolerated treatment well;No increased pain    Behavior During Therapy WFL for tasks assessed/performed           Past Medical History:  Diagnosis Date  . Acute blood loss anemia   . Arthritis   . Cancer ([place])    skin  . Central cord syndrome at C6 level of cervical spinal cord ([place]) 11/29/2017  . Hypertension   . Protein-calorie malnutrition, severe ([place]) 01/24/2018  . S/P insertion of IVC (inferior vena caval) filter 01/24/2018  . 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[facility])     Past Surgical History:  Procedure Laterality Date  . ANTERIOR CERVICAL DECOMP/DISCECTOMY FUSION N/A 11/29/2017   Procedure: Cervical five-six, six-seven Anterior Cervical Decompression Fusion;  Surgeon: Judith Part, MD;  Location: [place];  Service: Neurosurgery;  Laterality: N/A;  Cervical five-six, six-seven Anterior Cervical Decompression Fusion  . CATARACT EXTRACTION    . IR IVC FILTER PLMT / S&I /IMG GUID/MOD SED  12/08/2017    There were no vitals filed for this visit.   Subjective Assessment - 01/23/20 1720    Subjective Patient had a recent hospitalization due to constipation and was resolved with an enema. Patient denies of any pain or soreness since last therapy session.    Patient is accompained by: --    [name] (aide)   Pertinent History Pt is an 85 yo female that fell in 2019, fractured vertebrae in her neck and in her low back per family. 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 After hospital stay, pt discharged to [facility] for ~1 month, experienced severe UTI as well as bilateral DVT (IVC filter placed). Discharged to [facility], stayed for about a year, and then moved to [facility] in April 2021. Was receiving PT, but per family facility reported that did not have adequate equipment to maximize PT for her. Pt until about 1 month ago was practicing sit to stand transfers with 1-2 people, and working on static standing. Has a brace for L foot due to PF. Pt currently needs assistance with all ADLs (able to assist with donning/doffing her shirt), bed baths, and needs a hoyer lift for transfers. Able to drive power wheelchair without assistance.    Limitations Standing;Walking;House hold activities;Lifting    How long can you sit comfortably? NA    How long can you stand comfortably? unable    How long can you walk comfortably? unable    Patient Stated Goals "to stand up and walk"    Currently in Pain? No/denies          Wheelchair<>mat requiring hoyer lift.   Patient completed all exercises in today's session in unsupported sitting:  Trunk twists with soft red ball x 20 Hip  adduction with soft red ball squeezes x 20 Ball taps in multidirection x 30 each arm  Balloon toss x multidirection (emphasis on reaching) Glut squeezes x 30 reps  Weighted ball (yellow) going out and up x 20 reps Scapular squeezes x GTB x 15 reps  Modified PNF D2 flexion x GTB x 10 reps each UE Bicep curls x GTB x 15 reps each UE Seated LAQ x 30 reps bilaterally    Transfer from supine<>EOB requiring maxA for maneuvering BLE      PT Short Term Goals - 01/14/20 1454      PT SHORT TERM GOAL #1   Title The patient will perform initial HEP with minimum assistance in order to improve strength and  function.    Time 4    Period Weeks    Status On-going    Target Date 02/11/20             PT Long Term Goals - 01/14/20 1457      PT LONG TERM GOAL #1   Title The patient will be compliant with finalized HEP with minimum assistance in preparation for self management and maintenance of condition.    Time 12    Period Weeks    Status On-going    Target Date 04/07/20      PT LONG TERM GOAL #2   Title The patient will demonstrate at least 10 point improvement on FOTO score indicating an improved ability to perform functional activities.    Baseline on eval (/720) score 12; 10/22/19: 12; 12/10/19: 18, 12/13: 18    Time 12    Period Weeks    Status On-going    Target Date 04/07/20      PT LONG TERM GOAL #3   Title The patient will demonstrate lateral scooting  for assistance with transfers with minA to maximize independence.    Baseline 10/22/19: Pt requires modA+1 for lateral scooting; 12/10/19: modA+1, 12/13: modA-maxA    Time 12    Period Weeks    Status On-going    Target Date 04/07/20      PT LONG TERM GOAL #4   Title The patient will demonstrate a squat pivot transfer with minimum assistance to maximize independence and mobility.    Baseline 10/22/19: unable to safely attempt at this time; 12/10/19: unable to safely attempt at this time, 01/14/20: unable to safety attempt at this time    Time 12    Period Weeks    Status On-going    Target Date 04/07/20      PT LONG TERM GOAL #5   Title The patient will demonstrate sitting without UE support for 2-5 minutes at EOB to improve participation and maximize independence with ADLs.    Baseline 10/22/19: Pt able ot sit unsupported for at least 2 minutes at edge of bed    Time 12    Period Weeks    Status Achieved                 Plan - 01/23/20 1729    Clinical Impression Statement Patient shows good demonstration with exercises in unsupported sitting. Patient demonstrates muscle fatigue with BLE strengthening due to  decreased tissue capacity to repetitive motion. Will focus on improving lateral scooting technique in next therapy session. Patient will benefit from continued PT services to increase strength and mobility to improve patient's quality of life.    Personal Factors and Comorbidities Age;Time since onset of injury/illness/exacerbation;Comorbidity 3+;Fitness    Comorbidities HTN, quadriparesis,  history of DVT, neurogenic bladder    Examination-Activity Limitations Bathing;Hygiene/Grooming;Squat;Bed Mobility;Lift;Bend;Stand;Engineer, manufacturing;Toileting;Self Feeding;Transfers;Continence;Sit;Dressing;Sleep;Carry    Examination-Participation Restrictions Church;Laundry;Cleaning;Medication Management;Community Activity;Meal Prep;Interpersonal Relationship    Stability/Clinical Decision Making Evolving/Moderate complexity    Rehab Potential Fair    PT Frequency 2x / week    PT Duration 12 weeks    PT Treatment/Interventions ADLs/Self Care Home Management;Electrical Stimulation;Therapeutic activities;Wheelchair mobility training;Vasopneumatic Device;Joint Manipulations;Vestibular;Passive range of motion;Patient/family education;Therapeutic exercise;DME Instruction;Biofeedback;Aquatic Therapy;Moist Heat;Gait training;Balance training;Orthotic Fit/Training;Dry needling;Energy conservation;Taping;Splinting;Neuromuscular re-education;Cryotherapy;Ultrasound;Functional mobility training    PT Next Visit Plan sitting balance/endurance    PT Home Exercise Plan sitting balance unsupported hold 30-45sec with aide    Consulted and Agree with Plan of Care Patient    Family Member [place]           Patient will benefit from skilled therapeutic intervention in order to improve the following deficits and impairments:  Abnormal gait,Decreased balance,Decreased endurance,Decreased mobility,Difficulty walking,Hypomobility,Impaired sensation,Decreased range of motion,Improper body mechanics,Impaired  perceived functional ability,Decreased activity tolerance,Decreased knowledge of use of DME,Decreased safety awareness,Decreased strength,Impaired flexibility,Impaired UE functional use,Postural dysfunction  Visit Diagnosis: Muscle weakness (generalized)  Other lack of coordination  Other abnormalities of gait and mobility     Problem List Patient Active Problem List   Diagnosis Date Noted  . Acute appendicitis with appendiceal abscess 05/30/2018  . Hypocalcemia 02/15/2018  . Dysuria 02/15/2018  . Bilateral lower extremity edema 02/11/2018  . Vaginal yeast infection 01/30/2018  . Chest tightness 01/27/2018  . Healthcare-associated pneumonia 01/25/2018  . Pleural effusion, not elsewhere classified 01/25/2018  . Acute deep vein thrombosis (DVT) of left lower extremity ([place]) 01/24/2018  . Acute deep vein thrombosis (DVT) of right lower extremity ([place]) 01/24/2018  . Chronic allergic rhinitis 01/24/2018  . Depression with anxiety 01/24/2018  . UTI due to Klebsiella species 01/24/2018  . Protein-calorie malnutrition, severe ([place]) 01/24/2018  . S/P insertion of IVC (inferior vena caval) filter 01/24/2018  . Reactive depression   . Hypokalemia   . Leukocytosis   . Essential hypertension   . Trauma   . 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([place])   . Neuropathic pain   . Neurogenic bowel   . Neurogenic bladder   . Benign essential HTN   . Acute blood loss anemia   . Central cord syndrome at C6 level of cervical spinal cord ([place]) 11/29/2017  . Central cord syndrome [facility]) 11/29/2017   Karl Luke PT, DPT Netta Corrigan 01/23/2020, 5:38 PM  [place] MAIN [facility] SERVICES [address] [place], [place], [address] Phone: [phone]   Fax:  [phone]  Name: Sherry [name] MRN: [id number] Date of Birth: 10/25/1934

## 2020-01-24 ENCOUNTER — Encounter: Payer: Self-pay | Admitting: Occupational Therapy

## 2020-01-24 NOTE — Therapy (Signed)
[place] MAIN [facility] SERVICES [address] [place], [place], [address] Phone: [phone]   Fax:  [phone]  Occupational Therapy Treatment  Patient Details  Name: Sherry [name] MRN: [id number] Date of Birth: 1934-05-20 No data recorded  Encounter Date: 01/23/2020   OT End of Session - 01/24/20 0851    Visit Number 23    Number of Visits 24    Date for OT Re-Evaluation 02/27/20    Authorization Time Period Progress report period starting 09/17/19    OT Start Time 1300    OT Stop Time 1345    OT Time Calculation (min) 45 min    Activity Tolerance Patient tolerated treatment well    Behavior During Therapy [facility] for tasks assessed/performed           Past Medical History:  Diagnosis Date  . Acute blood loss anemia   . Arthritis   . Cancer ([place])    skin  . Central cord syndrome at C6 level of cervical spinal cord ([place]) 11/29/2017  . Hypertension   . Protein-calorie malnutrition, severe ([place]) 01/24/2018  . S/P insertion of IVC (inferior vena caval) filter 01/24/2018  . 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[facility])     Past Surgical History:  Procedure Laterality Date  . ANTERIOR CERVICAL DECOMP/DISCECTOMY FUSION N/A 11/29/2017   Procedure: Cervical five-six, six-seven Anterior Cervical Decompression Fusion;  Surgeon: Judith Part, MD;  Location: [place];  Service: Neurosurgery;  Laterality: N/A;  Cervical five-six, six-seven Anterior Cervical Decompression Fusion  . CATARACT EXTRACTION    . IR IVC FILTER PLMT / S&I /IMG GUID/MOD SED  12/08/2017    There were no vitals filed for this visit.   Subjective Assessment - 01/24/20 0851    Subjective  Pt. continues to work on completing her daily menus, and crossword puzzles.    Pertinent History Patient s/p fall November 29, 2017 resulting in diagnosis of central cord syndrome at C 6 level.  She has had therapy in multiple venues but with recent move to [facility], therapy staff recommended she seek  outpatient therapy for her needs.    Patient Stated Goals Patient would like to be able to move better in bed, stand and perform self care tasks.    Currently in Pain? No/denies          OT Treatment  Pt.tolerated bilateral shoulder flexion, extension, abduction, elbow flexion, extension forearm supination AAROM/AROM. PROM bilateral wrist extension, digit MP, PIP, and DIP flexion, and extension in conjunction with moist heat.Pt. worked on bilateral UE reaching with the right and Left UE using the horizontal [employer]. Pt. worked on reaching with incorporating her core to improve trunk control while reaching up, and sliding them to the right and left through 3 horizontal rungs.  Pt. continues to make steady progress. Pt. required assist and repositioning for reaching the saebo rings. Pt. Continues to be able to complete additional reps with both the right, and left.Pt.continues to engageher right hand whenperforming self-feeding with adpativeequipment.Pt. continues to be able to use her right thumb to hold items. Pt. tolerated ROM well without reports of pain, or discomfort. Pt.continues to benefit from working on improving ROM in order to work towards increasing engagement of her bilateral hands during ADLs, and IADL tasks.                        OT Education - 01/24/20 0851    Education Details UE ROM    Person(s)  Educated Patient;Caregiver(s)    Methods Explanation;Demonstration    Comprehension Verbalized understanding;Need further instruction               OT Long Term Goals - 01/09/20 2134      OT LONG TERM GOAL #1   Title Patient and caregiver will demonstrate understanding of home exercise program for ROM.    Baseline Pt. has a restorative aide rehab aide assist her wih exercises at [facility]    Time 12    Period Weeks    Status On-going    Target Date 02/27/20      OT LONG TERM GOAL #2   Title Patient will demonstrate ability to  don shirt with min assist from seated position.    Baseline indepedent with a larger shirt. Requires increased time to complete    Time 12    Period Weeks    Status On-going    Target Date 02/27/20      OT LONG TERM GOAL #3   Title Patient will demonstrate improved sitting balance at the edge of the bed to participate in self care tasks.    Baseline Pt. has an air mattress at [facility]    Time 12    Period Weeks    Status Deferred      OT LONG TERM GOAL #4   Title Patient will demonstrate improved composite finger flexion to hold deodorant to apply to underarms.    Baseline Pt. continues to present with improving finger flexion of R hand to use partial cylindrical grasp, but unable to grasp and hold items. Very limited digit flexion of L hand.    Time 12    Period Weeks    Status On-going    Target Date 02/27/20      OT LONG TERM GOAL #6   Title Patient will increase right UE ROM to comb the back of her hair with modified independence.    Baseline Pt. is able to comb the front, and sides of her head. Pt. can now reach the back, however cannot see what she is doing in the back of her head.    Time 12    Period Weeks    Status Partially Met    Target Date 02/27/20      OT LONG TERM GOAL #8   Title Pt. will write, and sign her name with 100% legibility, and modified independence.    Baseline Pt. is able to maintain grasp on a wide width pen. [place] to work on Media [employer].    Time 12    Period Weeks    Status On-going    Target Date 02/27/20                 Plan - 01/24/20 3810    Clinical Impression Statement Pt. continues to make steady progress. Pt. required assist and repositioning for reaching the saebo rings. Pt. Continues to be able to complete additional reps with both the right, and left.Pt.continues to engageher right hand whenperforming self-feeding with adpativeequipment.Pt. continues to be able to use her right thumb to hold items. Pt.  tolerated ROM well without reports of pain, or discomfort. Pt.continues to benefit from working on improving ROM in order to work towards increasing engagement of her bilateral hands during ADLs, and IADL tasks.   OT Occupational Profile and History Detailed Assessment- Review of Records and additional review of physical, cognitive, psychosocial history related to current functional performance    Occupational performance deficits (Please  refer to evaluation for details): ADL's;IADL's;Leisure;Social Participation    Body Structure / Function / Physical Skills ADL;Continence;Dexterity;Flexibility;Strength;ROM;Balance;Coordination;FMC;IADL;Endurance;UE functional use;Decreased knowledge of use of DME;GMC    Rehab Potential Fair    Clinical Decision Making Limited treatment options, no task modification necessary    Comorbidities Affecting Occupational Performance: Presence of comorbidities impacting occupational performance    Comorbidities impacting occupational performance description: contractures of bilateral hands, dependent transfers,    Modification or Assistance to Complete Evaluation  No modification of tasks or assist necessary to complete eval    OT Frequency 2x / week    OT Duration 12 weeks    OT Treatment/Interventions Self-care/ADL training;Cryotherapy;Therapeutic exercise;DME and/or AE instruction;Balance training;Neuromuscular education;Manual Therapy;Splinting;Moist Heat;Passive range of motion;Therapeutic activities;Patient/family education    Consulted and Agree with Plan of Care Patient           Patient will benefit from skilled therapeutic intervention in order to improve the following deficits and impairments:   Body Structure / Function / Physical Skills: ADL,Continence,Dexterity,Flexibility,Strength,ROM,Balance,Coordination,FMC,IADL,Endurance,UE functional use,Decreased knowledge of use of [place]       Visit Diagnosis: Muscle weakness (generalized)  Other lack of  coordination    Problem List Patient Active Problem List   Diagnosis Date Noted  . Acute appendicitis with appendiceal abscess 05/30/2018  . Hypocalcemia 02/15/2018  . Dysuria 02/15/2018  . Bilateral lower extremity edema 02/11/2018  . Vaginal yeast infection 01/30/2018  . Chest tightness 01/27/2018  . Healthcare-associated pneumonia 01/25/2018  . Pleural effusion, not elsewhere classified 01/25/2018  . Acute deep vein thrombosis (DVT) of left lower extremity ([place]) 01/24/2018  . Acute deep vein thrombosis (DVT) of right lower extremity ([place]) 01/24/2018  . Chronic allergic rhinitis 01/24/2018  . Depression with anxiety 01/24/2018  . UTI due to Klebsiella species 01/24/2018  . Protein-calorie malnutrition, severe ([place]) 01/24/2018  . S/P insertion of IVC (inferior vena caval) filter 01/24/2018  . Reactive depression   . Hypokalemia   . Leukocytosis   . Essential hypertension   . Trauma   . 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([place])   . Neuropathic pain   . Neurogenic bowel   . Neurogenic bladder   . Benign essential HTN   . Acute blood loss anemia   . Central cord syndrome at C6 level of cervical spinal cord ([place]) 11/29/2017  . Central cord syndrome [facility]) 11/29/2017    Harrel Carina, MS, OTR/L 01/24/2020, 8:54 AM  [place] MAIN [facility] SERVICES [address] [place], [place], [address] Phone: [phone]   Fax:  [phone]  Name: LACOSTA HARGAN MRN: [id number] Date of Birth: 1934/04/05

## 2020-01-26 LAB — CULTURE, BLOOD (ROUTINE X 2)
Culture: NO GROWTH
Culture: NO GROWTH

## 2020-01-28 ENCOUNTER — Ambulatory Visit: Payer: Medicare HMO | Admitting: Physical Therapy

## 2020-01-28 ENCOUNTER — Other Ambulatory Visit: Payer: Self-pay

## 2020-01-28 ENCOUNTER — Encounter: Payer: Self-pay | Admitting: Occupational Therapy

## 2020-01-28 ENCOUNTER — Ambulatory Visit: Payer: Medicare HMO | Admitting: Occupational Therapy

## 2020-01-28 DIAGNOSIS — M6281 Muscle weakness (generalized): Secondary | ICD-10-CM

## 2020-01-28 DIAGNOSIS — R2689 Other abnormalities of gait and mobility: Secondary | ICD-10-CM

## 2020-01-28 DIAGNOSIS — R278 Other lack of coordination: Secondary | ICD-10-CM

## 2020-01-28 NOTE — Therapy (Signed)
[place] MAIN [facility] SERVICES [address] [place], [place], [address] Phone: [phone]   Fax:  [phone]  Occupational Therapy Treatment  Patient Details  Name: Sherry [name] MRN: [id number] Date of Birth: February 01, 1935 No data recorded  Encounter Date: 01/28/2020   OT End of Session - 01/28/20 1352    Visit Number 24    Number of Visits 24    Date for OT Re-Evaluation 02/27/20    Authorization Time Period Progress report period starting 09/17/19    OT Start Time 1300    OT Stop Time 1345    OT Time Calculation (min) 45 min    Equipment Utilized During Treatment moist heat    Activity Tolerance Patient tolerated treatment well    Behavior During Therapy [facility] for tasks assessed/performed           Past Medical History:  Diagnosis Date  . Acute blood loss anemia   . Arthritis   . Cancer ([place])    skin  . Central cord syndrome at C6 level of cervical spinal cord ([place]) 11/29/2017  . Hypertension   . Protein-calorie malnutrition, severe ([place]) 01/24/2018  . S/P insertion of IVC (inferior vena caval) filter 01/24/2018  . 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[facility])     Past Surgical History:  Procedure Laterality Date  . ANTERIOR CERVICAL DECOMP/DISCECTOMY FUSION N/A 11/29/2017   Procedure: Cervical five-six, six-seven Anterior Cervical Decompression Fusion;  Surgeon: Judith Part, MD;  Location: [place];  Service: Neurosurgery;  Laterality: N/A;  Cervical five-six, six-seven Anterior Cervical Decompression Fusion  . CATARACT EXTRACTION    . IR IVC FILTER PLMT / S&I /IMG GUID/MOD SED  12/08/2017    There were no vitals filed for this visit.   Subjective Assessment - 01/28/20 1352    Subjective  Pt. continues to work on completing her daily menus, and crossword puzzles.    Pertinent History Patient s/p fall November 29, 2017 resulting in diagnosis of central cord syndrome at C 6 level.  She has had therapy in multiple venues but with recent move to  [facility], therapy staff recommended she seek outpatient therapy for her needs.    Currently in Pain? No/denies          OT Treatment  Pt.tolerated bilateral shoulder flexion, extension, abduction, elbow flexion, extension forearm supination AAROM/AROM. PROM bilateral wrist extension, digit MP, PIP, and DIP flexion, and extension in conjunction with moist heat.Pt. worked on bilateral UE reaching with the right and Left UE using the horizontal [employer]. Pt.worked on reaching with incorporating her core to improve trunk control while reaching up, and sliding them to the right and left through 3 horizontal rungs for multiple reps.Pt. dropped 2 rings each when attempting to remove them from the 3rd (top) rung.  Pt.continues to make steadyprogress. Pt.required assist and repositioning for reaching the saebo rings. Pt. Continues to be able to complete additional reps with both the right, and left.Pt.continues to engageher right hand whenperforming self-feeding with adpativeequipment.Pt.continues to beable to use her right thumb to hold items. Pt. tolerated ROM well without reports of pain, or discomfort. Pt.continues to benefit from working on improving ROM in order to work towards increasing engagement of her bilateral hands during ADLs, and IADL tasks.                          OT Education - 01/28/20 1352    Education Details UE ROM    Person(s) Educated  Patient;Caregiver(s)    Methods Explanation;Demonstration    Comprehension Verbalized understanding;Need further instruction               OT Long Term Goals - 01/09/20 2134      OT LONG TERM GOAL #1   Title Patient and caregiver will demonstrate understanding of home exercise program for ROM.    Baseline Pt. has a restorative aide rehab aide assist her wih exercises at [facility]    Time 12    Period Weeks    Status On-going    Target Date 02/27/20      OT LONG TERM GOAL #2   Title  Patient will demonstrate ability to don shirt with min assist from seated position.    Baseline indepedent with a larger shirt. Requires increased time to complete    Time 12    Period Weeks    Status On-going    Target Date 02/27/20      OT LONG TERM GOAL #3   Title Patient will demonstrate improved sitting balance at the edge of the bed to participate in self care tasks.    Baseline Pt. has an air mattress at [facility]    Time 12    Period Weeks    Status Deferred      OT LONG TERM GOAL #4   Title Patient will demonstrate improved composite finger flexion to hold deodorant to apply to underarms.    Baseline Pt. continues to present with improving finger flexion of R hand to use partial cylindrical grasp, but unable to grasp and hold items. Very limited digit flexion of L hand.    Time 12    Period Weeks    Status On-going    Target Date 02/27/20      OT LONG TERM GOAL #6   Title Patient will increase right UE ROM to comb the back of her hair with modified independence.    Baseline Pt. is able to comb the front, and sides of her head. Pt. can now reach the back, however cannot see what she is doing in the back of her head.    Time 12    Period Weeks    Status Partially Met    Target Date 02/27/20      OT LONG TERM GOAL #8   Title Pt. will write, and sign her name with 100% legibility, and modified independence.    Baseline Pt. is able to maintain grasp on a wide width pen. [place] to work on [employer].    Time 12    Period Weeks    Status On-going    Target Date 02/27/20                 Plan - 01/28/20 1353    Clinical Impression Statement Pt.continues to make steadyprogress. Pt.required assist and repositioning for reaching the saebo rings. Pt. Continues to be able to complete additional reps with both the right, and left.Pt.continues to engageher right hand whenperforming self-feeding with adpativeequipment.Pt.continues to beable to use her  right thumb to hold items. Pt. tolerated ROM well without reports of pain, or discomfort. Pt.continues to benefit from working on improving ROM in order to work towards increasing engagement of her bilateral hands during ADLs, and IADL tasks.   OT Occupational Profile and History Detailed Assessment- Review of Records and additional review of physical, cognitive, psychosocial history related to current functional performance    Occupational performance deficits (Please refer to evaluation for details): ADL's;IADL's;Leisure;Social  Participation    Body Structure / Function / Physical Skills ADL;Continence;Dexterity;Flexibility;Strength;ROM;Balance;Coordination;FMC;IADL;Endurance;UE functional use;Decreased knowledge of use of DME;GMC    Psychosocial Skills Environmental  Adaptations;Habits;Routines and Behaviors    Rehab Potential Fair    Clinical Decision Making Limited treatment options, no task modification necessary    Comorbidities Affecting Occupational Performance: Presence of comorbidities impacting occupational performance    Comorbidities impacting occupational performance description: contractures of bilateral hands, dependent transfers,    Modification or Assistance to Complete Evaluation  No modification of tasks or assist necessary to complete eval    OT Frequency 2x / week    OT Duration 12 weeks    OT Treatment/Interventions Self-care/ADL training;Cryotherapy;Therapeutic exercise;DME and/or AE instruction;Balance training;Neuromuscular education;Manual Therapy;Splinting;Moist Heat;Passive range of motion;Therapeutic activities;Patient/family education    Consulted and Agree with Plan of Care Patient           Patient will benefit from skilled therapeutic intervention in order to improve the following deficits and impairments:   Body Structure / Function / Physical Skills: ADL,Continence,Dexterity,Flexibility,Strength,ROM,Balance,Coordination,FMC,IADL,Endurance,UE functional  use,Decreased knowledge of use of [facility]   Psychosocial Skills: Environmental  Adaptations,Habits,Routines and Behaviors   Visit Diagnosis: Muscle weakness (generalized)    Problem List Patient Active Problem List   Diagnosis Date Noted  . Acute appendicitis with appendiceal abscess 05/30/2018  . Hypocalcemia 02/15/2018  . Dysuria 02/15/2018  . Bilateral lower extremity edema 02/11/2018  . Vaginal yeast infection 01/30/2018  . Chest tightness 01/27/2018  . Healthcare-associated pneumonia 01/25/2018  . Pleural effusion, not elsewhere classified 01/25/2018  . Acute deep vein thrombosis (DVT) of left lower extremity ([place]) 01/24/2018  . Acute deep vein thrombosis (DVT) of right lower extremity ([place]) 01/24/2018  . Chronic allergic rhinitis 01/24/2018  . Depression with anxiety 01/24/2018  . UTI due to Klebsiella species 01/24/2018  . Protein-calorie malnutrition, severe ([place]) 01/24/2018  . S/P insertion of IVC (inferior vena caval) filter 01/24/2018  . Reactive depression   . Hypokalemia   . Leukocytosis   . Essential hypertension   . Trauma   . 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([place])   . Neuropathic pain   . Neurogenic bowel   . Neurogenic bladder   . Benign essential HTN   . Acute blood loss anemia   . Central cord syndrome at C6 level of cervical spinal cord ([place]) 11/29/2017  . Central cord syndrome [facility]) 11/29/2017    Harrel Carina, MS, OTR/L 01/28/2020, 1:55 PM  [place] MAIN [facility] SERVICES [address] [place], [place], [address] Phone: [phone]   Fax:  [phone]  Name: CATRINA FELLENZ MRN: [id number] Date of Birth: 12-13-1934

## 2020-01-28 NOTE — Therapy (Signed)
[place] [facility] MAIN [facility] SERVICES [address] [place], [place], [address] Phone: [phone]   Fax:  [phone]  Physical Therapy Progress Note  Dates of reporting period  12/10/2019   to   01/28/2020   Patient Details  Name: Sherry Sherry [name] MRN: [id number] Date of Birth: Sep 28, 1934 Referring Provider (Sherry [name]): Tillman Abide   Encounter Date: 01/28/2020   Sherry [name] End of Session - 01/28/20 1321    Visit Number 30    Number of Visits 54    Date for Sherry [name] Re-Evaluation 04/07/20    Authorization Type [employer]are FOTO performed by Sherry [name] on eval (7/20), score 12    Sherry [name] Start Time 1345    Sherry [name] Stop Time 1430    Sherry [name] Time Calculation (min) 45 min    Equipment Utilized During Treatment Other (comment)   lift   Activity Tolerance Patient tolerated treatment well;No increased pain    Behavior During Therapy WFL for tasks assessed/performed           Past Medical History:  Diagnosis Date  . Acute blood loss anemia   . Arthritis   . Cancer ([facility])    skin  . Central cord syndrome at C6 level of cervical spinal cord ([facility]) 11/29/2017  . Hypertension   . Protein-calorie malnutrition, severe ([facility]) 01/24/2018  . S/P insertion of IVC (inferior vena caval) filter 01/24/2018  . 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[facility])     Past Surgical History:  Procedure Laterality Date  . ANTERIOR CERVICAL DECOMP/DISCECTOMY FUSION N/A 11/29/2017   Procedure: Cervical five-six, six-seven Anterior Cervical Decompression Fusion;  Surgeon: Jadene Pierini, MD;  [facility] OR;  Service: Neurosurgery;  Laterality: N/A;  Cervical five-six, six-seven Anterior Cervical Decompression Fusion  . CATARACT EXTRACTION    . IR IVC FILTER PLMT / S&I /IMG GUID/MOD SED  12/08/2017    There were no vitals filed for this visit.   Subjective Assessment - 01/28/20 1445    Subjective Patient denies of any new soreness or new pain since last therapy session.    Patient is accompained by: --   Sherry Sherry [name] (aide)    Pertinent History Sherry [name] is an 85 yo female that fell in 2019, fractured vertebrae in her neck and in her low back per family. Per chart, Sherry [name] experienced incomplete quadiparesis at level C6. After hospital stay, Sherry [name] discharged to [facility] for ~1 month, experienced severe UTI as well as bilateral DVT (IVC filter placed). Discharged to [facility], stayed for about a year, and then moved to [facility] in April 2021. Was receiving Sherry [name], but per family facility reported that did not have adequate equipment to maximize Sherry [name] for her. Sherry [name] until about 1 month ago was practicing sit to stand transfers with 1-2 people, and working on static standing. Has a brace for L foot due to PF. Sherry [name] currently needs assistance with all ADLs (able to assist with donning/doffing her shirt), bed baths, and needs a hoyer lift for transfers. Able to drive power wheelchair without assistance.    Limitations Standing;Walking;House hold activities;Lifting    How long can you sit comfortably? NA    How long can you stand comfortably? unable    How long can you walk comfortably? unable    Patient Stated Goals "to stand up and walk"    Currently in Pain? No/denies             Wheelchair<>mat requiring hoyer lift.   Patient completed all exercises in today's session in unsupported sitting:  Trunk twists with  soft red ball x 20 Hip adduction with soft red ball squeezes x 20 Ball taps in multidirection x 30 each arm  Balloon toss x multidirection (emphasis on reaching) Glut squeezes x 30 reps  Weighted ball (yellow) going out and up x 20 reps Scapular squeezes x GTB x 15 reps  Modified PNF D2 flexion x GTB x 10 reps each UE Bicep curls x GTB x 15 reps each UE Seated LAQ x 30 reps bilaterally  Throwing small balls into teal bucket x forward/sideways x 3 times each direction   Transfer from supine<>EOB requiring maxA for maneuvering BLE     Sherry [name] Short Term Goals - 01/28/20 1453      Sherry [name] SHORT TERM GOAL #1   Title The patient will perform  initial HEP with minimum assistance in order to improve strength and function.    Time 4    Period Weeks    Status On-going    Target Date 02/11/20             Sherry [name] Long Term Goals - 01/28/20 1454      Sherry [name] LONG TERM GOAL #1   Title The patient will be compliant with finalized HEP with minimum assistance in preparation for self management and maintenance of condition.    Time 12    Period Weeks    Status On-going    Target Date 04/07/20      Sherry [name] LONG TERM GOAL #2   Title The patient will demonstrate at least 10 point improvement on FOTO score indicating an improved ability to perform functional activities.    Baseline on eval (/720) score 12; 10/22/19: 12; 12/10/19: 18, 12/13: 18    Time 12    Period Weeks    Status On-going    Target Date 04/07/20      Sherry [name] LONG TERM GOAL #3   Title The patient will demonstrate lateral scooting  for assistance with transfers with minA to maximize independence.    Baseline 10/22/19: Sherry [name] requires modA+1 for lateral scooting; 12/10/19: modA+1, 12/13: modA-maxA, 12/27: modA-maxA    Time 12    Period Weeks    Status On-going    Target Date 04/07/20      Sherry [name] LONG TERM GOAL #4   Title The patient will demonstrate a squat pivot transfer with minimum assistance to maximize independence and mobility.    Baseline 10/22/19: unable to safely attempt at this time; 12/10/19: unable to safely attempt at this time, 01/14/20: unable to safety attempt at this time    Time 12    Period Weeks    Status On-going    Target Date 04/07/20      Sherry [name] LONG TERM GOAL #5   Title The patient will demonstrate sitting without UE support for 2-5 minutes at EOB to improve participation and maximize independence with ADLs.    Baseline 10/22/19: Sherry [name] able ot sit unsupported for at least 2 minutes at edge of bed    Time 12    Period Weeks    Status Achieved                 Plan - 01/28/20 1449    Clinical Impression Statement Updated patient's progress note in therapy session  today. Patient completed unsupported seated dynamic exercises with minimal LOB. Patient reports of difficulty with reaching in horizontal adduction on RUE due to limited mobility. Patient would continue to benefit from skilled Sherry [name] services to increase core strength and functional activity status to improve patient's  quality of life.    Personal Factors and Comorbidities Age;Time since onset of injury/illness/exacerbation;Comorbidity 3+;Fitness    Comorbidities HTN, quadriparesis, history of DVT, neurogenic bladder    Examination-Activity Limitations Bathing;Hygiene/Grooming;Squat;Bed Mobility;Lift;Bend;Stand;Engineer, manufacturing;Toileting;Self Feeding;Transfers;Continence;Sit;Dressing;Sleep;Carry    Examination-Participation Restrictions Church;Laundry;Cleaning;Medication Management;Community Activity;Meal Prep;Interpersonal Relationship    Stability/Clinical Decision Making Evolving/Moderate complexity    Rehab Potential Fair    Sherry [name] Frequency 2x / week    Sherry [name] Duration 12 weeks    Sherry [name] Treatment/Interventions ADLs/Self Care Home Management;Electrical Stimulation;Therapeutic activities;Wheelchair mobility training;Vasopneumatic Device;Joint Manipulations;Vestibular;Passive range of motion;Patient/family education;Therapeutic exercise;DME Instruction;Biofeedback;Aquatic Therapy;Moist Heat;Gait training;Balance training;Orthotic Fit/Training;Dry needling;Energy conservation;Taping;Splinting;Neuromuscular re-education;Cryotherapy;Ultrasound;Functional mobility training    Sherry [name] Next Visit Plan sitting balance/endurance    Sherry [name] Home Exercise Plan sitting balance unsupported hold 30-45sec with aide    Consulted and Agree with Plan of Care Patient    Family Member [place]           Patient will benefit from skilled therapeutic intervention in order to improve the following deficits and impairments:  Abnormal gait,Decreased balance,Decreased endurance,Decreased mobility,Difficulty  walking,Hypomobility,Impaired sensation,Decreased range of motion,Improper body mechanics,Impaired perceived functional ability,Decreased activity tolerance,Decreased knowledge of use of DME,Decreased safety awareness,Decreased strength,Impaired flexibility,Impaired UE functional use,Postural dysfunction  Visit Diagnosis: Muscle weakness (generalized)  Other lack of coordination  Other abnormalities of gait and mobility     Problem List Patient Active Problem List   Diagnosis Date Noted  . Acute appendicitis with appendiceal abscess 05/30/2018  . Hypocalcemia 02/15/2018  . Dysuria 02/15/2018  . Bilateral lower extremity edema 02/11/2018  . Vaginal yeast infection 01/30/2018  . Chest tightness 01/27/2018  . Healthcare-associated pneumonia 01/25/2018  . Pleural effusion, not elsewhere classified 01/25/2018  . Acute deep vein thrombosis (DVT) of left lower extremity ([place]) 01/24/2018  . Acute deep vein thrombosis (DVT) of right lower extremity ([place]) 01/24/2018  . Chronic allergic rhinitis 01/24/2018  . Depression with anxiety 01/24/2018  . UTI due to Klebsiella species 01/24/2018  . Protein-calorie malnutrition, severe ([place]) 01/24/2018  . S/P insertion of IVC (inferior vena caval) filter 01/24/2018  . Reactive depression   . Hypokalemia   . Leukocytosis   . Essential hypertension   . Trauma   . 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([place])   . Neuropathic pain   . Neurogenic bowel   . Neurogenic bladder   . Benign essential HTN   . Acute blood loss anemia   . Central cord syndrome at C6 level of cervical spinal cord ([place]) 11/29/2017  . Central cord syndrome [facility]) 11/29/2017   Sherry Sherry [name], Sherry Sherry [name] Netta Corrigan 01/28/2020, 2:56 PM  [place] MAIN [facility] SERVICES [address] [place], [place], [address] Phone: [phone]   Fax:  [phone]  Name: CARRISSA NEEPER MRN: [id number] Date of Birth: 08-Jul-1934

## 2020-01-30 ENCOUNTER — Ambulatory Visit: Payer: Medicare HMO

## 2020-01-30 ENCOUNTER — Ambulatory Visit: Payer: Medicare HMO | Admitting: Occupational Therapy

## 2020-01-30 DIAGNOSIS — L723 Sebaceous cyst: Secondary | ICD-10-CM

## 2020-01-30 DIAGNOSIS — B351 Tinea unguium: Secondary | ICD-10-CM

## 2020-02-01 DIAGNOSIS — L723 Sebaceous cyst: Secondary | ICD-10-CM | POA: Diagnosis not present

## 2020-02-04 ENCOUNTER — Ambulatory Visit: Payer: Medicare HMO | Attending: Internal Medicine | Admitting: Occupational Therapy

## 2020-02-04 ENCOUNTER — Encounter: Payer: Self-pay | Admitting: Occupational Therapy

## 2020-02-04 ENCOUNTER — Other Ambulatory Visit: Payer: Self-pay

## 2020-02-04 ENCOUNTER — Ambulatory Visit: Payer: Medicare HMO | Admitting: Physical Therapy

## 2020-02-04 DIAGNOSIS — S14129S Central cord syndrome at unspecified level of cervical spinal cord, sequela: Secondary | ICD-10-CM | POA: Insufficient documentation

## 2020-02-04 DIAGNOSIS — R278 Other lack of coordination: Secondary | ICD-10-CM | POA: Diagnosis present

## 2020-02-04 DIAGNOSIS — X58XXXS Exposure to other specified factors, sequela: Secondary | ICD-10-CM | POA: Diagnosis not present

## 2020-02-04 DIAGNOSIS — R140 Abdominal distension (gaseous): Secondary | ICD-10-CM | POA: Insufficient documentation

## 2020-02-04 DIAGNOSIS — M6281 Muscle weakness (generalized): Secondary | ICD-10-CM

## 2020-02-04 DIAGNOSIS — O2230 Deep phlebothrombosis in pregnancy, unspecified trimester: Secondary | ICD-10-CM | POA: Insufficient documentation

## 2020-02-04 DIAGNOSIS — R2689 Other abnormalities of gait and mobility: Secondary | ICD-10-CM | POA: Diagnosis present

## 2020-02-04 NOTE — Therapy (Signed)
[place] MAIN [facility] SERVICES [address] [place], [place], [address] Phone: [phone]   Fax:  [phone]  Occupational Therapy Treatment  Patient Details  Name: Sherry [name] MRN: [id number] Date of Birth: 04-07-1934 No data recorded  Encounter Date: 02/04/2020   OT End of Session - 02/04/20 1457    Visit Number 25    Number of Visits 64    Date for OT Re-Evaluation 02/27/20    Authorization Time Period Progress report period starting 09/17/19    OT Start Time 1302    OT Stop Time 1345    OT Time Calculation (min) 43 min    Activity Tolerance Patient tolerated treatment well    Behavior During Therapy [facility] for tasks assessed/performed           Past Medical History:  Diagnosis Date  . Acute blood loss anemia   . Arthritis   . Cancer ([place])    skin  . Central cord syndrome at C6 level of cervical spinal cord ([place]) 11/29/2017  . Hypertension   . Protein-calorie malnutrition, severe ([place]) 01/24/2018  . S/P insertion of IVC (inferior vena caval) filter 01/24/2018  . 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[facility])     Past Surgical History:  Procedure Laterality Date  . ANTERIOR CERVICAL DECOMP/DISCECTOMY FUSION N/A 11/29/2017   Procedure: Cervical five-six, six-seven Anterior Cervical Decompression Fusion;  Surgeon: Judith Part, MD;  Location: [place];  Service: Neurosurgery;  Laterality: N/A;  Cervical five-six, six-seven Anterior Cervical Decompression Fusion  . CATARACT EXTRACTION    . IR IVC FILTER PLMT / S&I /IMG GUID/MOD SED  12/08/2017    There were no vitals filed for this visit.   Subjective Assessment - 02/04/20 1456    Subjective  Pt. continues to work on completing her daily menus, and crossword puzzles.    Pertinent History Patient s/p fall November 29, 2017 resulting in diagnosis of central cord syndrome at C 6 level.  She has had therapy in multiple venues but with recent move to [facility], therapy staff recommended she seek  outpatient therapy for her needs.    Patient Stated Goals Patient would like to be able to move better in bed, stand and perform self care tasks.    Currently in Pain? No/denies           OT Treatment  Pt.tolerated bilateral shoulder flexion, extension, abduction, elbow flexion, extension forearm supination AAROM/AROM. PROM bilateral wrist extension, digit MP, PIP, and DIP flexion, and extension in conjunction with moist heat.Pt. worked on bilateral UE reaching with the right and Left UE using the horizontal [employer]. Pt.worked on reaching with incorporating her core to improve trunk control while reaching up, and sliding them to the rightand leftthrough 3 horizontal rungs for multiple reps.  Pt.continues to make steadyprogress. Pt.required assist and repositioning for reaching the saebo rings. Pt.Continues to beable to complete additional repswith both the right, and left.Pt.continues to engageher right hand whenperforming self-feeding with adpativeequipment.Pt.continues to beable to use her right thumb to hold items. Pt. tolerated ROM well without reports of pain, or discomfort. Pt.continues to benefit from working on improving ROM in order to work towards increasing engagement of her bilateral hands during ADLs, and IADL tasks.                       OT Education - 02/04/20 1456    Education Details UE ROM    Person(s) Educated Patient;Caregiver(s)    Methods Explanation;Demonstration  Comprehension Verbalized understanding;Need further instruction               OT Long Term Goals - 01/09/20 2134      OT LONG TERM GOAL #1   Title Patient and caregiver will demonstrate understanding of home exercise program for ROM.    Baseline Pt. has a restorative aide rehab aide assist her wih exercises at [facility]    Time 12    Period Weeks    Status On-going    Target Date 02/27/20      OT LONG TERM GOAL #2   Title Patient will  demonstrate ability to don shirt with min assist from seated position.    Baseline indepedent with a larger shirt. Requires increased time to complete    Time 12    Period Weeks    Status On-going    Target Date 02/27/20      OT LONG TERM GOAL #3   Title Patient will demonstrate improved sitting balance at the edge of the bed to participate in self care tasks.    Baseline Pt. has an air mattress at [facility]    Time 12    Period Weeks    Status Deferred      OT LONG TERM GOAL #4   Title Patient will demonstrate improved composite finger flexion to hold deodorant to apply to underarms.    Baseline Pt. continues to present with improving finger flexion of R hand to use partial cylindrical grasp, but unable to grasp and hold items. Very limited digit flexion of L hand.    Time 12    Period Weeks    Status On-going    Target Date 02/27/20      OT LONG TERM GOAL #6   Title Patient will increase right UE ROM to comb the back of her hair with modified independence.    Baseline Pt. is able to comb the front, and sides of her head. Pt. can now reach the back, however cannot see what she is doing in the back of her head.    Time 12    Period Weeks    Status Partially Met    Target Date 02/27/20      OT LONG TERM GOAL #8   Title Pt. will write, and sign her name with 100% legibility, and modified independence.    Baseline Pt. is able to maintain grasp on a wide width pen. [place] to work on [employer].    Time 12    Period Weeks    Status On-going    Target Date 02/27/20                 Plan - 02/04/20 1457    Clinical Impression Statement Pt.continues to make steadyprogress. Pt.required assist and repositioning for reaching the saebo rings. Pt.Continues to beable to complete additional repswith both the right, and left.Pt.continues to engageher right hand whenperforming self-feeding with adpativeequipment.Pt.continues to beable to use her right thumb to  hold items. Pt. tolerated ROM well without reports of pain, or discomfort. Pt.continues to benefit from working on improving ROM in order to work towards increasing engagement of her bilateral hands during ADLs, and IADL tasks.     OT Occupational Profile and History Detailed Assessment- Review of Records and additional review of physical, cognitive, psychosocial history related to current functional performance    Occupational performance deficits (Please refer to evaluation for details): ADL's;IADL's;Leisure;Social Participation    Body Structure / Function / Physical  Skills ADL;Continence;Dexterity;Flexibility;Strength;ROM;Balance;Coordination;FMC;IADL;Endurance;UE functional use;Decreased knowledge of use of DME;G[facility]    Psychosocial Skills Environmental  Adaptations;Habits;Routines and Behaviors    Rehab Potential Fair    Clinical Decision Making Limited treatment options, no task modification necessary    Comorbidities Affecting Occupational Performance: Presence of comorbidities impacting occupational performance    Modification or Assistance to Complete Evaluation  No modification of tasks or assist necessary to complete eval    OT Frequency 2x / week    OT Duration 12 weeks    OT Treatment/Interventions Self-care/ADL training;Cryotherapy;Therapeutic exercise;DME and/or AE instruction;Balance training;Neuromuscular education;Manual Therapy;Splinting;Moist Heat;Passive range of motion;Therapeutic activities;Patient/family education    Consulted and Agree with Plan of Care Patient           Patient will benefit from skilled therapeutic intervention in order to improve the following deficits and impairments:   Body Structure / Function / Physical Skills: ADL,Continence,Dexterity,Flexibility,Strength,ROM,Balance,Coordination,FMC,IADL,Endurance,UE functional use,Decreased knowledge of use of [facility]   Psychosocial Skills: Environmental  Adaptations,Habits,Routines and Behaviors   Visit  Diagnosis: Muscle weakness (generalized)  Other lack of coordination    Problem List Patient Active Problem List   Diagnosis Date Noted  . Acute appendicitis with appendiceal abscess 05/30/2018  . Hypocalcemia 02/15/2018  . Dysuria 02/15/2018  . Bilateral lower extremity edema 02/11/2018  . Vaginal yeast infection 01/30/2018  . Chest tightness 01/27/2018  . Healthcare-associated pneumonia 01/25/2018  . Pleural effusion, not elsewhere classified 01/25/2018  . Acute deep vein thrombosis (DVT) of left lower extremity ([place]) 01/24/2018  . Acute deep vein thrombosis (DVT) of right lower extremity ([place]) 01/24/2018  . Chronic allergic rhinitis 01/24/2018  . Depression with anxiety 01/24/2018  . UTI due to Klebsiella species 01/24/2018  . Protein-calorie malnutrition, severe ([place]) 01/24/2018  . S/P insertion of IVC (inferior vena caval) filter 01/24/2018  . Reactive depression   . Hypokalemia   . Leukocytosis   . Essential hypertension   . Trauma   . 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([place])   . Neuropathic pain   . Neurogenic bowel   . Neurogenic bladder   . Benign essential HTN   . Acute blood loss anemia   . Central cord syndrome at C6 level of cervical spinal cord ([place]) 11/29/2017  . Central cord syndrome [facility]) 11/29/2017    Harrel Carina, MS, OTR/L 02/04/2020, 3:03 PM  [place] MAIN [facility] SERVICES [address] [place], [place], [address] Phone: [phone]   Fax:  [phone]  Name: Sherry [name] MRN: [id number] Date of Birth: 02/04/1934

## 2020-02-04 NOTE — Therapy (Signed)
[place] [facility] MAIN [facility] SERVICES [address] [place], [place], [address] Phone: [phone]   Fax:  [phone]  Physical Therapy Treatment  Patient Details  Name: Sherry [name] MRN: [id number] Date of Birth: [birth year] Referring Provider (PT): Tillman Abide   Encounter Date: 02/04/2020   PT End of Session - 02/04/20 1524    Visit Number 32    Number of Visits 54    Date for PT Re-Evaluation 04/07/20    Authorization Type [employer]na medicare FOTO performed by PT on eval (7/20), score 12    PT Start Time 1345    PT Stop Time 1430    PT Time Calculation (min) 45 min    Equipment Utilized During Treatment Other (comment)   lift   Activity Tolerance Patient tolerated treatment well;No increased pain    Behavior During Therapy WFL for tasks assessed/performed           Past Medical History:  Diagnosis Date  . Acute blood loss anemia   . Arthritis   . Cancer ([facility])    skin  . Central cord syndrome at C6 level of cervical spinal cord ([facility]) 11/29/2017  . Hypertension   . Protein-calorie malnutrition, severe ([facility]) 01/24/2018  . S/P insertion of IVC (inferior vena caval) filter 01/24/2018  . 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[facility])     Past Surgical History:  Procedure Laterality Date  . ANTERIOR CERVICAL DECOMP/DISCECTOMY FUSION N/A 11/29/2017   Procedure: Cervical five-six, six-seven Anterior Cervical Decompression Fusion;  Surgeon: Jadene Pierini, MD;  [facility];  Service: Neurosurgery;  Laterality: N/A;  Cervical five-six, six-seven Anterior Cervical Decompression Fusion  . CATARACT EXTRACTION    . IR IVC FILTER PLMT / S&I /IMG GUID/MOD SED  12/08/2017    There were no vitals filed for this visit.   Subjective Assessment - 02/04/20 1523    Subjective Patient reports her GI system have been resolved and feels good today. She denies of any soreness since last therapy session.    Patient is accompained by: --   Venita Sheffield (aide)   Pertinent History  Pt is an 86 yo female that fell in 2019, fractured vertebrae in her neck and in her low back per family. 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 After hospital stay, pt discharged to [facility] for ~1 month, experienced severe UTI as well as bilateral DVT (IVC filter placed). Discharged to [facility], stayed for about a year, and then moved to [facility] in April 2021. Was receiving PT, but per family facility reported that did not have adequate equipment to maximize PT for her. Pt until about 1 month ago was practicing sit to stand transfers with 1-2 people, and working on static standing. Has a brace for L foot due to PF. Pt currently needs assistance with all ADLs (able to assist with donning/doffing her shirt), bed baths, and needs a hoyer lift for transfers. Able to drive power wheelchair without assistance.    Limitations Standing;Walking;House hold activities;Lifting    How long can you sit comfortably? NA    How long can you stand comfortably? unable    How long can you walk comfortably? unable    Patient Stated Goals "to stand up and walk"    Currently in Pain? No/denies            Wheelchair<>mat requiring hoyer lift.   Patient completed all exercises in today's session in unsupported sitting:  Trunk twists with soft red ball x 20 Hip adduction with  soft red ball squeezes x 20 Ball taps in multidirection x 30 each arm  Balloon toss x multidirection (emphasis on reaching) Glut squeezes x 30 reps  Weighted ball (yellow) going out and up x 20 reps Scapular squeezes x GTB x 15 reps  Modified PNF D2 flexion x GTB x 10 reps each UE Bicep curls x GTB x 15 reps each UE Seated LAQ x 30 reps bilaterally  Throwing small balls into teal bucket x forward/sideways x 3 times each direction   Transfer from supine<>EOB requiring maxA for maneuvering BLE               PT Short Term Goals - 01/28/20 1453      PT SHORT TERM GOAL #1   Title The patient will perform  initial HEP with minimum assistance in order to improve strength and function.    Time 4    Period Weeks    Status On-going    Target Date 02/11/20             PT Long Term Goals - 01/28/20 1454      PT LONG TERM GOAL #1   Title The patient will be compliant with finalized HEP with minimum assistance in preparation for self management and maintenance of condition.    Time 12    Period Weeks    Status On-going    Target Date 04/07/20      PT LONG TERM GOAL #2   Title The patient will demonstrate at least 10 point improvement on FOTO score indicating an improved ability to perform functional activities.    Baseline on eval (/720) score 12; 10/22/19: 12; 12/10/19: 18, 12/13: 18    Time 12    Period Weeks    Status On-going    Target Date 04/07/20      PT LONG TERM GOAL #3   Title The patient will demonstrate lateral scooting  for assistance with transfers with minA to maximize independence.    Baseline 10/22/19: Pt requires modA+1 for lateral scooting; 12/10/19: modA+1, 12/13: modA-maxA, 12/27: modA-maxA    Time 12    Period Weeks    Status On-going    Target Date 04/07/20      PT LONG TERM GOAL #4   Title The patient will demonstrate a squat pivot transfer with minimum assistance to maximize independence and mobility.    Baseline 10/22/19: unable to safely attempt at this time; 12/10/19: unable to safely attempt at this time, 01/14/20: unable to safety attempt at this time    Time 12    Period Weeks    Status On-going    Target Date 04/07/20      PT LONG TERM GOAL #5   Title The patient will demonstrate sitting without UE support for 2-5 minutes at EOB to improve participation and maximize independence with ADLs.    Baseline 10/22/19: Pt able ot sit unsupported for at least 2 minutes at edge of bed    Time 12    Period Weeks    Status Achieved                 Plan - 02/04/20 1524    Clinical Impression Statement Progressed patient with dynamic unsupported sitting  exercises with minimal loss of balance. Patient demonstrates decreased weight bearing through left gluts due to discomfort. She was able to complete dynamic reaching exercises in multidirections with minimal pain. She continues to have difficulty with horizontal adduction on RUE. Patient would benefit from  continued PT services to increase strength, mobility, and functional capacity to improve patient's quality of life.    Personal Factors and Comorbidities Age;Time since onset of injury/illness/exacerbation;Comorbidity 3+;Fitness    Comorbidities HTN, quadriparesis, history of DVT, neurogenic bladder    Examination-Activity Limitations Bathing;Hygiene/Grooming;Squat;Bed Mobility;Lift;Bend;Stand;Engineer, manufacturing;Toileting;Self Feeding;Transfers;Continence;Sit;Dressing;Sleep;Carry    Examination-Participation Restrictions Church;Laundry;Cleaning;Medication Management;Community Activity;Meal Prep;Interpersonal Relationship    Stability/Clinical Decision Making Evolving/Moderate complexity    Rehab Potential Fair    PT Frequency 2x / week    PT Duration 12 weeks    PT Treatment/Interventions ADLs/Self Care Home Management;Electrical Stimulation;Therapeutic activities;Wheelchair mobility training;Vasopneumatic Device;Joint Manipulations;Vestibular;Passive range of motion;Patient/family education;Therapeutic exercise;DME Instruction;Biofeedback;Aquatic Therapy;Moist Heat;Gait training;Balance training;Orthotic Fit/Training;Dry needling;Energy conservation;Taping;Splinting;Neuromuscular re-education;Cryotherapy;Ultrasound;Functional mobility training    PT Next Visit Plan sitting balance/endurance    PT Home Exercise Plan sitting balance unsupported hold 30-45sec with aide    Consulted and Agree with Plan of Care Patient    Family Member [place]           Patient will benefit from skilled therapeutic intervention in order to improve the following deficits and impairments:   Abnormal gait,Decreased balance,Decreased endurance,Decreased mobility,Difficulty walking,Hypomobility,Impaired sensation,Decreased range of motion,Improper body mechanics,Impaired perceived functional ability,Decreased activity tolerance,Decreased knowledge of use of DME,Decreased safety awareness,Decreased strength,Impaired flexibility,Impaired UE functional use,Postural dysfunction  Visit Diagnosis: Muscle weakness (generalized)  Other lack of coordination  Other abnormalities of gait and mobility  Abdominal distention     Problem List Patient Active Problem List   Diagnosis Date Noted  . Acute appendicitis with appendiceal abscess 05/30/2018  . Hypocalcemia 02/15/2018  . Dysuria 02/15/2018  . Bilateral lower extremity edema 02/11/2018  . Vaginal yeast infection 01/30/2018  . Chest tightness 01/27/2018  . Healthcare-associated pneumonia 01/25/2018  . Pleural effusion, not elsewhere classified 01/25/2018  . Acute deep vein thrombosis (DVT) of left lower extremity ([place]) 01/24/2018  . Acute deep vein thrombosis (DVT) of right lower extremity ([place]) 01/24/2018  . Chronic allergic rhinitis 01/24/2018  . Depression with anxiety 01/24/2018  . UTI due to Klebsiella species 01/24/2018  . Protein-calorie malnutrition, severe ([place]) 01/24/2018  . S/P insertion of IVC (inferior vena caval) filter 01/24/2018  . Reactive depression   . Hypokalemia   . Leukocytosis   . Essential hypertension   . Trauma   . 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([place])   . Neuropathic pain   . Neurogenic bowel   . Neurogenic bladder   . Benign essential HTN   . Acute blood loss anemia   . Central cord syndrome at C6 level of cervical spinal cord ([place]) 11/29/2017  . Central cord syndrome [facility]) 11/29/2017   Karl Luke PT, DPT Netta Corrigan 02/04/2020, 3:27 PM  [place] MAIN [facility] SERVICES [address] [place], [place], [address] Phone: [phone]   Fax:  [phone]  Name:  TENISA FLEETWOOD MRN: [id number] Date of Birth: 1934/10/28

## 2020-02-06 ENCOUNTER — Other Ambulatory Visit: Payer: Self-pay

## 2020-02-06 ENCOUNTER — Ambulatory Visit: Payer: Medicare HMO | Admitting: Occupational Therapy

## 2020-02-06 ENCOUNTER — Ambulatory Visit: Payer: Medicare HMO | Admitting: Physical Therapy

## 2020-02-06 ENCOUNTER — Encounter: Payer: Self-pay | Admitting: Occupational Therapy

## 2020-02-06 DIAGNOSIS — R2689 Other abnormalities of gait and mobility: Secondary | ICD-10-CM

## 2020-02-06 DIAGNOSIS — R140 Abdominal distension (gaseous): Secondary | ICD-10-CM

## 2020-02-06 DIAGNOSIS — M6281 Muscle weakness (generalized): Secondary | ICD-10-CM

## 2020-02-06 DIAGNOSIS — S14129S Central cord syndrome at unspecified level of cervical spinal cord, sequela: Secondary | ICD-10-CM

## 2020-02-06 DIAGNOSIS — R278 Other lack of coordination: Secondary | ICD-10-CM

## 2020-02-06 NOTE — Therapy (Signed)
[place] MAIN [facility] SERVICES [address] [place], [place], [address] Phone: [phone]   Fax:  [phone]  Physical Therapy Treatment  Patient Details  Name: Sherry [name] MRN: [id number] Date of Birth: [birth year] Referring Provider (PT): Viviana Simpler   Encounter Date: 02/06/2020   PT End of Session - 02/06/20 1354    Visit Number 33    Number of Visits 54    Date for PT Re-Evaluation 04/07/20    Authorization Type aetna medicare FOTO performed by PT on eval (7/20), score 12    PT Start Time 1345    PT Stop Time 1430    PT Time Calculation (min) 45 min    Equipment Utilized During Treatment Other (comment)   lift   Activity Tolerance Patient tolerated treatment well;No increased pain    Behavior During Therapy WFL for tasks assessed/performed           Past Medical History:  Diagnosis Date  . Acute blood loss anemia   . Arthritis   . Cancer ([place])    skin  . Central cord syndrome at C6 level of cervical spinal cord ([place]) 11/29/2017  . Hypertension   . Protein-calorie malnutrition, severe ([place]) 01/24/2018  . S/P insertion of IVC (inferior vena caval) filter 01/24/2018  . 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[facility])     Past Surgical History:  Procedure Laterality Date  . ANTERIOR CERVICAL DECOMP/DISCECTOMY FUSION N/A 11/29/2017   Procedure: Cervical five-six, six-seven Anterior Cervical Decompression Fusion;  Surgeon: Judith Part, MD;  Location: [place];  Service: Neurosurgery;  Laterality: N/A;  Cervical five-six, six-seven Anterior Cervical Decompression Fusion  . CATARACT EXTRACTION    . IR IVC FILTER PLMT / S&I /IMG GUID/MOD SED  12/08/2017    There were no vitals filed for this visit.   Subjective Assessment - 02/06/20 1352    Subjective Patient reports she did not have a good night sleep last night due to feeling cold. She denies of any pain or soreness since last therapy session.    Patient is accompained by: --   [name] (aide)    Pertinent History Pt is an 86 yo female that fell in 2019, fractured vertebrae in her neck and in her low back per family. 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 After hospital stay, pt discharged to [facility] for ~1 month, experienced severe UTI as well as bilateral DVT (IVC filter placed). Discharged to [facility], stayed for about a year, and then moved to [facility] in April 2021. Was receiving PT, but per family facility reported that did not have adequate equipment to maximize PT for her. Pt until about 1 month ago was practicing sit to stand transfers with 1-2 people, and working on static standing. Has a brace for L foot due to PF. Pt currently needs assistance with all ADLs (able to assist with donning/doffing her shirt), bed baths, and needs a hoyer lift for transfers. Able to drive power wheelchair without assistance.    Limitations Standing;Walking;House hold activities;Lifting    How long can you sit comfortably? NA    How long can you stand comfortably? unable    How long can you walk comfortably? unable    Patient Stated Goals "to stand up and walk"    Currently in Pain? No/denies            Hoyer lift x WC<> mat  Supine: Glut squeezes x 30 reps Supine alternating penguins (lateral crunches) for oblique activation x 30  bilateral  Supine anterior/posterior pelvic tilts x 30 reps   Seated: Balloon tap x 3 mins in multidirection Weighted yellow ball out/up x 2 10 reps Core twists holding yellow ball x 2 10 reps  LAQ x therapist provides maxA for RLE x 2 10 reps each LE         PT Short Term Goals - 01/28/20 1453      PT SHORT TERM GOAL #1   Title The patient will perform initial HEP with minimum assistance in order to improve strength and function.    Time 4    Period Weeks    Status On-going    Target Date 02/11/20             PT Long Term Goals - 01/28/20 1454      PT LONG TERM GOAL #1   Title The patient will be compliant with  finalized HEP with minimum assistance in preparation for self management and maintenance of condition.    Time 12    Period Weeks    Status On-going    Target Date 04/07/20      PT LONG TERM GOAL #2   Title The patient will demonstrate at least 10 point improvement on FOTO score indicating an improved ability to perform functional activities.    Baseline on eval (/720) score 12; 10/22/19: 12; 12/10/19: 18, 12/13: 18    Time 12    Period Weeks    Status On-going    Target Date 04/07/20      PT LONG TERM GOAL #3   Title The patient will demonstrate lateral scooting  for assistance with transfers with minA to maximize independence.    Baseline 10/22/19: Pt requires modA+1 for lateral scooting; 12/10/19: modA+1, 12/13: modA-maxA, 12/27: modA-maxA    Time 12    Period Weeks    Status On-going    Target Date 04/07/20      PT LONG TERM GOAL #4   Title The patient will demonstrate a squat pivot transfer with minimum assistance to maximize independence and mobility.    Baseline 10/22/19: unable to safely attempt at this time; 12/10/19: unable to safely attempt at this time, 01/14/20: unable to safety attempt at this time    Time 12    Period Weeks    Status On-going    Target Date 04/07/20      PT LONG TERM GOAL #5   Title The patient will demonstrate sitting without UE support for 2-5 minutes at EOB to improve participation and maximize independence with ADLs.    Baseline 10/22/19: Pt able ot sit unsupported for at least 2 minutes at edge of bed    Time 12    Period Weeks    Status Achieved                 Plan - 02/06/20 1731    Clinical Impression Statement Patient demonstrates improvement from supine to EOB with trunk requiring minA from therapist and maxA to navigate BLE. Patient tolerated unsupported sitting with good tolerance to activity. She completed dynamic seated balance exercises with fair+ balance. Therapist provided verbal cues to increased weight shifting to the right  with fair demonstration. Patient would benefit from continued PT services to increase strength and mobility to improve patient's quality of life.    Personal Factors and Comorbidities Age;Time since onset of injury/illness/exacerbation;Comorbidity 3+;Fitness    Comorbidities HTN, quadriparesis, history of DVT, neurogenic bladder    Examination-Activity Limitations Bathing;Hygiene/Grooming;Squat;Bed Mobility;Lift;Bend;Stand;Engineer, manufacturing;Toileting;Self Feeding;Transfers;Continence;Sit;Dressing;Sleep;Carry  Examination-Participation Restrictions Church;Laundry;Cleaning;Medication Management;Community Activity;Meal Prep;Interpersonal Relationship    Stability/Clinical Decision Making Evolving/Moderate complexity    Rehab Potential Fair    PT Frequency 2x / week    PT Duration 12 weeks    PT Treatment/Interventions ADLs/Self Care Home Management;Electrical Stimulation;Therapeutic activities;Wheelchair mobility training;Vasopneumatic Device;Joint Manipulations;Vestibular;Passive range of motion;Patient/family education;Therapeutic exercise;DME Instruction;Biofeedback;Aquatic Therapy;Moist Heat;Gait training;Balance training;Orthotic Fit/Training;Dry needling;Energy conservation;Taping;Splinting;Neuromuscular re-education;Cryotherapy;Ultrasound;Functional mobility training    PT Next Visit Plan sitting balance/endurance    PT Home Exercise Plan sitting balance unsupported hold 30-45sec with aide    Consulted and Agree with Plan of Care Patient    Family Member Consulted Aide Gladys           Patient will benefit from skilled therapeutic intervention in order to improve the following deficits and impairments:  Abnormal gait,Decreased balance,Decreased endurance,Decreased mobility,Difficulty walking,Hypomobility,Impaired sensation,Decreased range of motion,Improper body mechanics,Impaired perceived functional ability,Decreased activity tolerance,Decreased knowledge of use of  DME,Decreased safety awareness,Decreased strength,Impaired flexibility,Impaired UE functional use,Postural dysfunction  Visit Diagnosis: Muscle weakness (generalized)  Other abnormalities of gait and mobility  Abdominal distention  Central cord syndrome, sequela (HCC)  Other lack of coordination     Problem List Patient Active Problem List   Diagnosis Date Noted  . Acute appendicitis with appendiceal abscess 05/30/2018  . Hypocalcemia 02/15/2018  . Dysuria 02/15/2018  . Bilateral lower extremity edema 02/11/2018  . Vaginal yeast infection 01/30/2018  . Chest tightness 01/27/2018  . Healthcare-associated pneumonia 01/25/2018  . Pleural effusion, not elsewhere classified 01/25/2018  . Acute deep vein thrombosis (DVT) of left lower extremity (HCC) 01/24/2018  . Acute deep vein thrombosis (DVT) of right lower extremity (HCC) 01/24/2018  . Chronic allergic rhinitis 01/24/2018  . Depression with anxiety 01/24/2018  . UTI due to Klebsiella species 01/24/2018  . Protein-calorie malnutrition, severe (H[facility]) 01/24/2018  . S/P insertion of IVC (inferior vena caval) filter 01/24/2018  . Reactive depression   . Hypokalemia   . Leukocytosis   . Essential hypertension   . Trauma   . 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(HCC)   . Neuropathic pain   . Neurogenic bowel   . Neurogenic bladder   . Benign essential HTN   . Acute blood loss anemia   . Central cord syndrome at C6 level of cervical spinal cord (HCC) 11/29/2017  . Central cord syndrome [facility]) 11/29/2017   Jillyn Hidden PT, DPT Amelia Jo 02/06/2020, 5:45 PM  [facility] MAIN [facility] SERVICES [address] [place], [place], [address] Phone: [phone]   Fax:  [phone]  Name: Sherry [name] MRN: [id number] Date of Birth: 10/07/1934

## 2020-02-06 NOTE — Therapy (Signed)
[place] MAIN [facility] SERVICES [address] [place], [place], [address] Phone: [phone]   Fax:  [phone]  Occupational Therapy Treatment  Patient Details  Name: Sherry [name] MRN: [id number] Date of Birth: 1934-02-05 No data recorded  Encounter Date: 02/06/2020   OT End of Session - 02/06/20 1447    Visit Number 26    Number of Visits 32    Date for OT Re-Evaluation 02/27/20    Authorization Time Period Progress report period starting 01/14/2020    OT Start Time 1300    OT Stop Time 1345    OT Time Calculation (min) 45 min    Equipment Utilized During Treatment moist heat    Activity Tolerance Patient tolerated treatment well    Behavior During Therapy [facility] for tasks assessed/performed           Past Medical History:  Diagnosis Date  . Acute blood loss anemia   . Arthritis   . Cancer ([place])    skin  . Central cord syndrome at C6 level of cervical spinal cord ([place]) 11/29/2017  . Hypertension   . Protein-calorie malnutrition, severe ([place]) 01/24/2018  . S/P insertion of IVC (inferior vena caval) filter 01/24/2018  . 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[facility])     Past Surgical History:  Procedure Laterality Date  . ANTERIOR CERVICAL DECOMP/DISCECTOMY FUSION N/A 11/29/2017   Procedure: Cervical five-six, six-seven Anterior Cervical Decompression Fusion;  Surgeon: Judith Part, MD;  Location: [place];  Service: Neurosurgery;  Laterality: N/A;  Cervical five-six, six-seven Anterior Cervical Decompression Fusion  . CATARACT EXTRACTION    . IR IVC FILTER PLMT / S&I /IMG GUID/MOD SED  12/08/2017    There were no vitals filed for this visit.   Subjective Assessment - 02/06/20 1446    Subjective  Pt. reports doing well today    Patient is accompanied by: Family member    Pertinent History Patient s/p fall November 29, 2017 resulting in diagnosis of central cord syndrome at C 6 level.  She has had therapy in multiple venues but with recent move to  [facility], therapy staff recommended she seek outpatient therapy for her needs.    Currently in Pain? No/denies          OT Treatment  Pt.tolerated bilateral shoulder flexion, extension, abduction, elbow flexion, extension forearm supination AAROM/AROM. PROM bilateral wrist extension, digit MP, PIP, and DIP flexion, and extension in conjunction with moist heat.Pt. worked on reaching using the [employer]. Pt. Grasped and moved the shapes through 2 vertical dowels of progressively increasing heights.  Pt.continues to make steadyprogress. Pt. required 2 hands to move the shapes over the 2nd vertical dowel. Pt.Continues to beable to complete additional repswith both the right, and left.Pt.continues to engageher right hand whenperforming self-feeding with adpativeequipment.Pt.continues to beable to use her right thumb to hold items. Pt. tolerated ROM well without reports of pain, or discomfort. Pt.continues to benefit from working on improving ROM in order to work towards increasing engagement of her bilateral hands during ADLs, and IADL tasks.                        OT Education - 02/06/20 1446    Education Details UE ROM    Person(s) Educated Patient;Caregiver(s)    Methods Explanation;Demonstration    Comprehension Verbalized understanding;Need further instruction               OT Long Term Goals - 01/09/20 2134  OT LONG TERM GOAL #1   Title Patient and caregiver will demonstrate understanding of home exercise program for ROM.    Baseline Pt. has a restorative aide rehab aide assist her wih exercises at [facility]    Time 12    Period Weeks    Status On-going    Target Date 02/27/20      OT LONG TERM GOAL #2   Title Patient will demonstrate ability to don shirt with min assist from seated position.    Baseline indepedent with a larger shirt. Requires increased time to complete    Time 12    Period Weeks    Status On-going     Target Date 02/27/20      OT LONG TERM GOAL #3   Title Patient will demonstrate improved sitting balance at the edge of the bed to participate in self care tasks.    Baseline Pt. has an air mattress at [facility]    Time 12    Period Weeks    Status Deferred      OT LONG TERM GOAL #4   Title Patient will demonstrate improved composite finger flexion to hold deodorant to apply to underarms.    Baseline Pt. continues to present with improving finger flexion of R hand to use partial cylindrical grasp, but unable to grasp and hold items. Very limited digit flexion of L hand.    Time 12    Period Weeks    Status On-going    Target Date 02/27/20      OT LONG TERM GOAL #6   Title Patient will increase right UE ROM to comb the back of her hair with modified independence.    Baseline Pt. is able to comb the front, and sides of her head. Pt. can now reach the back, however cannot see what she is doing in the back of her head.    Time 12    Period Weeks    Status Partially Met    Target Date 02/27/20      OT LONG TERM GOAL #8   Title Pt. will write, and sign her name with 100% legibility, and modified independence.    Baseline Pt. is able to maintain grasp on a wide width pen. [place] to work on Media pl[employer].    Time 12    Period Weeks    Status On-going    Target Date 02/27/20                 Plan - 02/06/20 1448    Clinical Impression Statement Pt.continues to make steadyprogress. Pt. required 2 hands to move the shapes over the 2nd vertical dowel. Pt.Continues to beable to complete additional repswith both the right, and left.Pt.continues to engageher right hand whenperforming self-feeding with adpativeequipment.Pt.continues to beable to use her right thumb to hold items. Pt. tolerated ROM well without reports of pain, or discomfort. Pt.continues to benefit from working on improving ROM in order to work towards increasing engagement of her bilateral hands  during ADLs, and IADL tasks.   OT Occupational Profile and History Detailed Assessment- Review of Records and additional review of physical, cognitive, psychosocial history related to current functional performance    Occupational performance deficits (Please refer to evaluation for details): ADL's;IADL's;Leisure;Social Participation    Body Structure / Function / Physical Skills ADL;Continence;Dexterity;Flexibility;Strength;ROM;Balance;Coordination;FMC;IADL;Endurance;UE functional use;Decreased knowledge of use of DME;GMC    Psychosocial Skills Environmental  Adaptations;Habits;Routines and Behaviors    Rehab Potential Fair    Clinical  Decision Making Limited treatment options, no task modification necessary    Comorbidities Affecting Occupational Performance: Presence of comorbidities impacting occupational performance    Comorbidities impacting occupational performance description: contractures of bilateral hands, dependent transfers,    Modification or Assistance to Complete Evaluation  No modification of tasks or assist necessary to complete eval    OT Frequency 2x / week    OT Duration 12 weeks    OT Treatment/Interventions Self-care/ADL training;Cryotherapy;Therapeutic exercise;DME and/or AE instruction;Balance training;Neuromuscular education;Manual Therapy;Splinting;Moist Heat;Passive range of motion;Therapeutic activities;Patient/family education    Consulted and Agree with Plan of Care Patient           Patient will benefit from skilled therapeutic intervention in order to improve the following deficits and impairments:   Body Structure / Function / Physical Skills: ADL,Continence,Dexterity,Flexibility,Strength,ROM,Balance,Coordination,FMC,IADL,Endurance,UE functional use,Decreased knowledge of use of [facility]   Psychosocial Skills: Environmental  Adaptations,Habits,Routines and Behaviors   Visit Diagnosis: Muscle weakness (generalized)  Other lack of  coordination    Problem List Patient Active Problem List   Diagnosis Date Noted  . Acute appendicitis with appendiceal abscess 05/30/2018  . Hypocalcemia 02/15/2018  . Dysuria 02/15/2018  . Bilateral lower extremity edema 02/11/2018  . Vaginal yeast infection 01/30/2018  . Chest tightness 01/27/2018  . Healthcare-associated pneumonia 01/25/2018  . Pleural effusion, not elsewhere classified 01/25/2018  . Acute deep vein thrombosis (DVT) of left lower extremity ([place]) 01/24/2018  . Acute deep vein thrombosis (DVT) of right lower extremity ([place]) 01/24/2018  . Chronic allergic rhinitis 01/24/2018  . Depression with anxiety 01/24/2018  . UTI due to Klebsiella species 01/24/2018  . Protein-calorie malnutrition, severe ([place]) 01/24/2018  . S/P insertion of IVC (inferior vena caval) filter 01/24/2018  . Reactive depression   . Hypokalemia   . Leukocytosis   . Essential hypertension   . Trauma   . 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([place])   . Neuropathic pain   . Neurogenic bowel   . Neurogenic bladder   . Benign essential HTN   . Acute blood loss anemia   . Central cord syndrome at C6 level of cervical spinal cord ([place]) 11/29/2017  . Central cord syndrome [facility]) 11/29/2017    Harrel Carina, MS, OTR/L 02/06/2020, 2:50 PM  [place] MAIN [facility] SERVICES [address] [place], [place], [address] Phone: [phone]   Fax:  [phone]  Name: Sherry [name] MRN: [id number] Date of Birth: 1934/03/05

## 2020-02-12 ENCOUNTER — Ambulatory Visit: Payer: Medicare HMO | Admitting: Occupational Therapy

## 2020-02-12 ENCOUNTER — Encounter: Payer: Self-pay | Admitting: Occupational Therapy

## 2020-02-12 ENCOUNTER — Ambulatory Visit: Payer: Medicare HMO

## 2020-02-12 ENCOUNTER — Other Ambulatory Visit: Payer: Self-pay

## 2020-02-12 DIAGNOSIS — M6281 Muscle weakness (generalized): Secondary | ICD-10-CM

## 2020-02-12 DIAGNOSIS — O2230 Deep phlebothrombosis in pregnancy, unspecified trimester: Secondary | ICD-10-CM

## 2020-02-12 DIAGNOSIS — R140 Abdominal distension (gaseous): Secondary | ICD-10-CM

## 2020-02-12 DIAGNOSIS — S14129S Central cord syndrome at unspecified level of cervical spinal cord, sequela: Secondary | ICD-10-CM

## 2020-02-12 DIAGNOSIS — R278 Other lack of coordination: Secondary | ICD-10-CM

## 2020-02-12 DIAGNOSIS — R2689 Other abnormalities of gait and mobility: Secondary | ICD-10-CM

## 2020-02-12 NOTE — Therapy (Signed)
[place] MAIN [facility] SERVICES [address] [place], [place], [address] Phone: [phone]   Fax:  [phone]  Occupational Therapy Treatment  Patient Details  Name: Sherry [name] MRN: [id number] Date of Birth: Jul 27, 1934 No data recorded  Encounter Date: 02/12/2020   OT End of Session - 02/12/20 1426    Visit Number 27    Number of Visits 25    Date for OT Re-Evaluation 02/27/20    Authorization Time Period Progress report period starting 01/14/2020    OT Start Time 1300    OT Stop Time 1345    OT Time Calculation (min) 45 min    Activity Tolerance Patient tolerated treatment well    Behavior During Therapy [facility] for tasks assessed/performed           Past Medical History:  Diagnosis Date  . Acute blood loss anemia   . Arthritis   . Cancer ([place])    skin  . Central cord syndrome at C6 level of cervical spinal cord ([place]) 11/29/2017  . Hypertension   . Protein-calorie malnutrition, severe ([place]) 01/24/2018  . S/P insertion of IVC (inferior vena caval) filter 01/24/2018  . 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[facility])     Past Surgical History:  Procedure Laterality Date  . ANTERIOR CERVICAL DECOMP/DISCECTOMY FUSION N/A 11/29/2017   Procedure: Cervical five-six, six-seven Anterior Cervical Decompression Fusion;  Surgeon: Judith Part, MD;  Location: [place];  Service: Neurosurgery;  Laterality: N/A;  Cervical five-six, six-seven Anterior Cervical Decompression Fusion  . CATARACT EXTRACTION    . IR IVC FILTER PLMT / S&I /IMG GUID/MOD SED  12/08/2017    There were no vitals filed for this visit.   Subjective Assessment - 02/12/20 1425    Subjective  Pt. reports doing well today    Patient is accompanied by: Family member    Pertinent History Patient s/p fall November 29, 2017 resulting in diagnosis of central cord syndrome at C 6 level.  She has had therapy in multiple venues but with recent move to [facility], therapy staff recommended she seek  outpatient therapy for her needs.    Currently in Pain? No/denies           OT Treatment  Pt.tolerated bilateral shoulder flexion, extension, abduction, elbow flexion, extension forearm supination AAROM/AROM. PROM bilateral wrist extension, digit MP, PIP, and DIP flexion, and extension in conjunction with moist heat.Pt. worked on bilateral UE reaching with the right and Left UE using the horizontal [employer]. Pt.worked on reaching with incorporating her core to improve trunk control while reaching up, and sliding them to the rightand leftthrough 3 horizontal rungs for multiple reps.Pt. dropped 2 rings each when attempting to remove them from the 3rd (top) rung. Pt. worked on reaching using the [employer]. Pt. Grasped and moved the shapes through 2 vertical dowels of varying heights. Pt. Requires the use of BUEs.  Pt.continues to make steadyprogress. Pt.required assist and repositioning for reaching the saebo rings, and the Shape tower. Pt. Was able to complete the Saebo rings efficiently to the 3rd horizontal rung using bilateral hands, however presented with increased difficulty with placing the shapes into position over the 2nd vertical dowel. Pt.continues to engageher right hand whenperforming self-feeding with adpativeequipment.Pt.continues to beable to use her right thumb to hold items. Pt. tolerated ROM well without reports of pain, or discomfort. Pt.continues to benefit from working on improving ROM in order to work towards increasing engagement of her bilateral hands during ADLs, and IADL tasks.  OT Long Term Goals - 01/09/20 2134      OT LONG TERM GOAL #1   Title Patient and caregiver will demonstrate understanding of home exercise program for ROM.    Baseline Pt. has a restorative aide rehab aide assist her wih exercises at [facility]    Time 12    Period Weeks    Status On-going    Target Date 02/27/20       OT LONG TERM GOAL #2   Title Patient will demonstrate ability to don shirt with min assist from seated position.    Baseline indepedent with a larger shirt. Requires increased time to complete    Time 12    Period Weeks    Status On-going    Target Date 02/27/20      OT LONG TERM GOAL #3   Title Patient will demonstrate improved sitting balance at the edge of the bed to participate in self care tasks.    Baseline Pt. has an air mattress at [facility]    Time 12    Period Weeks    Status Deferred      OT LONG TERM GOAL #4   Title Patient will demonstrate improved composite finger flexion to hold deodorant to apply to underarms.    Baseline Pt. continues to present with improving finger flexion of R hand to use partial cylindrical grasp, but unable to grasp and hold items. Very limited digit flexion of L hand.    Time 12    Period Weeks    Status On-going    Target Date 02/27/20      OT LONG TERM GOAL #6   Title Patient will increase right UE ROM to comb the back of her hair with modified independence.    Baseline Pt. is able to comb the front, and sides of her head. Pt. can now reach the back, however cannot see what she is doing in the back of her head.    Time 12    Period Weeks    Status Partially Met    Target Date 02/27/20      OT LONG TERM GOAL #8   Title Pt. will write, and sign her name with 100% legibility, and modified independence.    Baseline Pt. is able to maintain grasp on a wide width pen. [place] to work on Media planner.    Time 12    Period Weeks    Status On-going    Target Date 02/27/20                 Plan - 02/12/20 1426    Clinical Impression Statement Pt.continues to make steadyprogress. Pt.required assist and repositioning for reaching the saebo rings, and the Shape tower. Pt. Was able to complete the Saebo rings efficiently to the 3rd horizontal rung using bilateral hands, however presented with increased difficulty with placing  the shapes into position over the 2nd vertical dowel. Pt.continues to engageher right hand whenperforming self-feeding with adpativeequipment.Pt.continues to beable to use her right thumb to hold items. Pt. tolerated ROM well without reports of pain, or discomfort. Pt.continues to benefit from working on improving ROM in order to work towards increasing engagement of her bilateral hands during ADLs, and IADL tasks.     OT Occupational Profile and History Detailed Assessment- Review of Records and additional review of physical, cognitive, psychosocial history related to current functional performance    Occupational performance deficits (Please refer to evaluation for details): ADL's;IADL's;Leisure;Social Participation  Body Structure / Function / Physical Skills ADL;Continence;Dexterity;Flexibility;Strength;ROM;Balance;Coordination;FMC;IADL;Endurance;UE functional use;Decreased knowledge of use of DME;GMC    Psychosocial Skills Environmental  Adaptations;Habits;Routines and Behaviors    Rehab Potential Fair    Clinical Decision Making Limited treatment options, no task modification necessary    Comorbidities Affecting Occupational Performance: Presence of comorbidities impacting occupational performance    Comorbidities impacting occupational performance description: contractures of bilateral hands, dependent transfers,    Modification or Assistance to Complete Evaluation  No modification of tasks or assist necessary to complete eval    OT Frequency 2x / week    OT Duration 12 weeks    OT Treatment/Interventions Self-care/ADL training;Cryotherapy;Therapeutic exercise;DME and/or AE instruction;Balance training;Neuromuscular education;Manual Therapy;Splinting;Moist Heat;Passive range of motion;Therapeutic activities;Patient/family education    Consulted and Agree with Plan of Care Patient           Patient will benefit from skilled therapeutic intervention in order to improve the  following deficits and impairments:   Body Structure / Function / Physical Skills: ADL,Continence,Dexterity,Flexibility,Strength,ROM,Balance,Coordination,FMC,IADL,Endurance,UE functional use,Decreased knowledge of use of [facility]   Psychosocial Skills: Environmental  Adaptations,Habits,Routines and Behaviors   Visit Diagnosis: Muscle weakness (generalized)    Problem List Patient Active Problem List   Diagnosis Date Noted  . Acute appendicitis with appendiceal abscess 05/30/2018  . Hypocalcemia 02/15/2018  . Dysuria 02/15/2018  . Bilateral lower extremity edema 02/11/2018  . Vaginal yeast infection 01/30/2018  . Chest tightness 01/27/2018  . Healthcare-associated pneumonia 01/25/2018  . Pleural effusion, not elsewhere classified 01/25/2018  . Acute deep vein thrombosis (DVT) of left lower extremity ([place]) 01/24/2018  . Acute deep vein thrombosis (DVT) of right lower extremity ([place]) 01/24/2018  . Chronic allergic rhinitis 01/24/2018  . Depression with anxiety 01/24/2018  . UTI due to Klebsiella species 01/24/2018  . Protein-calorie malnutrition, severe ([place]) 01/24/2018  . S/P insertion of IVC (inferior vena caval) filter 01/24/2018  . Reactive depression   . Hypokalemia   . Leukocytosis   . Essential hypertension   . Trauma   . 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([place])   . Neuropathic pain   . Neurogenic bowel   . Neurogenic bladder   . Benign essential HTN   . Acute blood loss anemia   . Central cord syndrome at C6 level of cervical spinal cord ([place]) 11/29/2017  . Central cord syndrome [facility]) 11/29/2017    Harrel Carina, MS , OTR/L 02/12/2020, 2:28 PM  [place] MAIN [facility] SERVICES [address] [place], [place], [address] Phone: [phone]   Fax:  [phone]  Name: Sherry [name] MRN: [id number] Date of Birth: 1934/05/30

## 2020-02-12 NOTE — Therapy (Signed)
[place] MAIN [facility] SERVICES [address] [place], [place], [address] Phone: [phone]   Fax:  [phone]  Physical Therapy Treatment  Patient Details  Name: Sherry [name] MRN: [id number] Date of Birth: [birth year] Referring Provider (PT): Viviana Simpler   Encounter Date: 02/12/2020   PT End of Session - 02/12/20 1336    Visit Number 34    Number of Visits 54    Date for PT Re-Evaluation 04/07/20    Authorization Type [employer]are FOTO performed by PT on eval (7/20), score 12    PT Start Time 1415    PT Stop Time 1500    PT Time Calculation (min) 45 min    Equipment Utilized During Treatment Other (comment)   lift   Activity Tolerance Patient tolerated treatment well;No increased pain    Behavior During Therapy WFL for tasks assessed/performed           Past Medical History:  Diagnosis Date  . Acute blood loss anemia   . Arthritis   . Cancer ([place])    skin  . Central cord syndrome at C6 level of cervical spinal cord ([place]) 11/29/2017  . Hypertension   . Protein-calorie malnutrition, severe ([place]) 01/24/2018  . S/P insertion of IVC (inferior vena caval) filter 01/24/2018  . 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[facility])     Past Surgical History:  Procedure Laterality Date  . ANTERIOR CERVICAL DECOMP/DISCECTOMY FUSION N/A 11/29/2017   Procedure: Cervical five-six, six-seven Anterior Cervical Decompression Fusion;  Surgeon: Judith Part, MD;  Location: [place];  Service: Neurosurgery;  Laterality: N/A;  Cervical five-six, six-seven Anterior Cervical Decompression Fusion  . CATARACT EXTRACTION    . IR IVC FILTER PLMT / S&I /IMG GUID/MOD SED  12/08/2017    There were no vitals filed for this visit.   Subjective Assessment - 02/12/20 1422    Subjective Patient reports being fatigued from OT visit but agreeable to treatment today.    Patient is accompained by: --   [name] (aide)   Pertinent History Pt is an 86 yo female that fell in 2019,  fractured vertebrae in her neck and in her low back per family. 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 After hospital stay, pt discharged to [facility] for ~1 month, experienced severe UTI as well as bilateral DVT (IVC filter placed). Discharged to [facility], stayed for about a year, and then moved to [facility] in April 2021. Was receiving PT, but per family facility reported that did not have adequate equipment to maximize PT for her. Pt until about 1 month ago was practicing sit to stand transfers with 1-2 people, and working on static standing. Has a brace for L foot due to PF. Pt currently needs assistance with all ADLs (able to assist with donning/doffing her shirt), bed baths, and needs a hoyer lift for transfers. Able to drive power wheelchair without assistance.    Limitations Standing;Walking;House hold activities;Lifting    How long can you sit comfortably? NA    How long can you stand comfortably? unable    How long can you walk comfortably? unable    Patient Stated Goals "to stand up and walk"    Currently in Pain? No/denies           Hoyer lift x WC<> mat   Therex:  Supine: Glut squeezes x 30 reps- Verbal cues to count out loud for proper breathing. Bridging- 10 reps followed by rest then 4 more focusing on quality of lift  and trying to hold 2-3 sec.  Hip adduction squeeze with ball x 3 sec hold x 10 reps Hip abduction (assisted with foot sliding on slide board) - with min/mod physical assist x 10 reps bilaterally  Dependent rolling and supine to sit.   Seated: Balloon tap x 3 mins in multidirection. Patient without assist for posture/seated balance. Weighted yellow ball out/up/diagonals x 5 reps each direction. Core twists holding yellow ball x 2 10 reps  LAQ x therapist provides maxA for RLE and independent with left LE x 10 reps with verbal cues for slow motion for improved muscle control.  Heel raises (right LE only) x 10 reps Toe raises (right LE  only) x 10 reps Scap retraction using green theraband x 10 reps   Clinical Impression: Patient able to perform LE strengthening with verbal cues, visual demonstration, and tactile cues with some physical assist for Right LE due to ongoing weakness.  Patient able to perform dynamic seated exercises today well today without loss of balance stating fatigue as limiting factor. Patient able to verbalize and demo current  home program and encouraged to perform daily for continued strength gains. She will benefit from continued PT services to continue to focus on strengthening and balance activities.                             PT Education - 02/12/20 1506    Education Details Reinforced home program instructions with patient    Person(s) Educated Patient    Methods Explanation;Demonstration;Verbal cues    Comprehension Verbalized understanding;Returned demonstration;Need further instruction            PT Short Term Goals - 01/28/20 1453      PT SHORT TERM GOAL #1   Title The patient will perform initial HEP with minimum assistance in order to improve strength and function.    Time 4    Period Weeks    Status On-going    Target Date 02/11/20             PT Long Term Goals - 01/28/20 1454      PT LONG TERM GOAL #1   Title The patient will be compliant with finalized HEP with minimum assistance in preparation for self management and maintenance of condition.    Time 12    Period Weeks    Status On-going    Target Date 04/07/20      PT LONG TERM GOAL #2   Title The patient will demonstrate at least 10 point improvement on FOTO score indicating an improved ability to perform functional activities.    Baseline on eval (/720) score 12; 10/22/19: 12; 12/10/19: 18, 12/13: 18    Time 12    Period Weeks    Status On-going    Target Date 04/07/20      PT LONG TERM GOAL #3   Title The patient will demonstrate lateral scooting  for assistance with transfers with  minA to maximize independence.    Baseline 10/22/19: Pt requires modA+1 for lateral scooting; 12/10/19: modA+1, 12/13: modA-maxA, 12/27: modA-maxA    Time 12    Period Weeks    Status On-going    Target Date 04/07/20      PT LONG TERM GOAL #4   Title The patient will demonstrate a squat pivot transfer with minimum assistance to maximize independence and mobility.    Baseline 10/22/19: unable to safely attempt at this time; 12/10/19: unable  to safely attempt at this time, 01/14/20: unable to safety attempt at this time    Time 12    Period Weeks    Status On-going    Target Date 04/07/20      PT LONG TERM GOAL #5   Title The patient will demonstrate sitting without UE support for 2-5 minutes at EOB to improve participation and maximize independence with ADLs.    Baseline 10/22/19: Pt able ot sit unsupported for at least 2 minutes at edge of bed    Time 12    Period Weeks    Status Achieved                 Plan - 02/12/20 1519    Clinical Impression Statement Patient able to perform LE strengthening with verbal cues, visual demonstration, and tactile cues with some physical assist for Right LE due to ongoing weakness.  Patient able to perform dynamic seated exercises today well today without loss of balance stating fatigue as limiting factor. Patient able to verbalize and demo current  home program and encouraged to perform daily for continued strength gains. She will benefit from continued PT services to continue to focus on strengthening and balance activities.    Personal Factors and Comorbidities Age;Time since onset of injury/illness/exacerbation;Comorbidity 3+;Fitness    Comorbidities HTN, quadriparesis, history of DVT, neurogenic bladder    Examination-Activity Limitations Bathing;Hygiene/Grooming;Squat;Bed Mobility;Lift;Bend;Stand;Engineer, manufacturing;Toileting;Self Feeding;Transfers;Continence;Sit;Dressing;Sleep;Carry    Examination-Participation Restrictions  Church;Laundry;Cleaning;Medication Management;Community Activity;Meal Prep;Interpersonal Relationship    Stability/Clinical Decision Making Evolving/Moderate complexity    Rehab Potential Fair    PT Frequency 2x / week    PT Duration 12 weeks    PT Treatment/Interventions ADLs/Self Care Home Management;Electrical Stimulation;Therapeutic activities;Wheelchair mobility training;Vasopneumatic Device;Joint Manipulations;Vestibular;Passive range of motion;Patient/family education;Therapeutic exercise;DME Instruction;Biofeedback;Aquatic Therapy;Moist Heat;Gait training;Balance training;Orthotic Fit/Training;Dry needling;Energy conservation;Taping;Splinting;Neuromuscular re-education;Cryotherapy;Ultrasound;Functional mobility training    PT Next Visit Plan sitting balance/endurance    PT Home Exercise Plan sitting balance unsupported hold 30-45sec with aide    Consulted and Agree with Plan of Care Patient    Family Member Edna           Patient will benefit from skilled therapeutic intervention in order to improve the following deficits and impairments:  Abnormal gait,Decreased balance,Decreased endurance,Decreased mobility,Difficulty walking,Hypomobility,Impaired sensation,Decreased range of motion,Improper body mechanics,Impaired perceived functional ability,Decreased activity tolerance,Decreased knowledge of use of DME,Decreased safety awareness,Decreased strength,Impaired flexibility,Impaired UE functional use,Postural dysfunction  Visit Diagnosis: Muscle weakness (generalized)  Other abnormalities of gait and mobility  Abdominal distention  Central cord syndrome, sequela ([facility])  Other lack of coordination  DVT (deep vein thrombosis) in pregnancy     Problem List Patient Active Problem List   Diagnosis Date Noted  . Acute appendicitis with appendiceal abscess 05/30/2018  . Hypocalcemia 02/15/2018  . Dysuria 02/15/2018  . Bilateral lower extremity edema 02/11/2018   . Vaginal yeast infection 01/30/2018  . Chest tightness 01/27/2018  . Healthcare-associated pneumonia 01/25/2018  . Pleural effusion, not elsewhere classified 01/25/2018  . Acute deep vein thrombosis (DVT) of left lower extremity ([place]) 01/24/2018  . Acute deep vein thrombosis (DVT) of right lower extremity ([place]) 01/24/2018  . Chronic allergic rhinitis 01/24/2018  . Depression with anxiety 01/24/2018  . UTI due to Klebsiella species 01/24/2018  . Protein-calorie malnutrition, severe ([place]) 01/24/2018  . S/P insertion of IVC (inferior vena caval) filter 01/24/2018  . Reactive depression   . Hypokalemia   . Leukocytosis   . Essential hypertension   . Trauma   . 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([place])   .  Neuropathic pain   . Neurogenic bowel   . Neurogenic bladder   . Benign essential HTN   . Acute blood loss anemia   . Central cord syndrome at C6 level of cervical spinal cord ([employer]) 11/29/2017  . Central cord syndrome [facility]) 11/29/2017    Lewis Moccasin, PT 02/12/2020, 3:22 PM  [place] MAIN [facility] SERVICES [address] [place], [place], [address] Phone: [phone]   Fax:  [phone]  Name: Sherry [name] MRN: [id number] Date of Birth: [birth year]

## 2020-02-13 DIAGNOSIS — G8220 Paraplegia, unspecified: Secondary | ICD-10-CM

## 2020-02-13 DIAGNOSIS — F39 Unspecified mood [affective] disorder: Secondary | ICD-10-CM | POA: Diagnosis not present

## 2020-02-13 DIAGNOSIS — I10 Essential (primary) hypertension: Secondary | ICD-10-CM | POA: Diagnosis not present

## 2020-02-13 DIAGNOSIS — I872 Venous insufficiency (chronic) (peripheral): Secondary | ICD-10-CM | POA: Diagnosis not present

## 2020-02-13 DIAGNOSIS — M1990 Unspecified osteoarthritis, unspecified site: Secondary | ICD-10-CM

## 2020-02-13 DIAGNOSIS — K219 Gastro-esophageal reflux disease without esophagitis: Secondary | ICD-10-CM

## 2020-02-13 DIAGNOSIS — K592 Neurogenic bowel, not elsewhere classified: Secondary | ICD-10-CM

## 2020-02-13 DIAGNOSIS — E7429 Other disorders of galactose metabolism: Secondary | ICD-10-CM

## 2020-02-13 DIAGNOSIS — I82729 Chronic embolism and thrombosis of deep veins of unspecified upper extremity: Secondary | ICD-10-CM

## 2020-02-16 IMAGING — CT CT CHEST W CONTRAST
2 of 5 series · 11 of 36 positions shown, 13 images · IV contrast (omnipaque)
Comparison: None.

CLINICAL DATA: 83 y/o F; fall with lacerations to the elbows and
the right-sided chin. Decreased sensation from the mid chest and
below. No movement, sensation, or response to painful stimuli of the
lower extremities.

EXAM:
CT CHEST, ABDOMEN, AND PELVIS WITH CONTRAST
CT THORACIC SPINE WITHOUT CONTRAST
CT LUMBAR SPINE WITHOUT CONTRAST
TECHNIQUE: Multidetector CT imaging of the chest, abdomen and pelvis was
performed following the standard protocol during bolus
administration of intravenous contrast. Multidetector CT imaging of
the thoracic and lumbar spine were reconstructed from the CT of
chest, abdomen, and pelvis images. Multiplanar CT image
reconstructions were also generated.
CONTRAST:  100mL OMNIPAQUE IOHEXOL 300 MG/ML  SOLN

[Series 3: cap with · axial · 0.69mm/px · z∈[-754,-234]mm · 8 of 130 slices shown, 10 images]
[im 13/130  mediastinal]
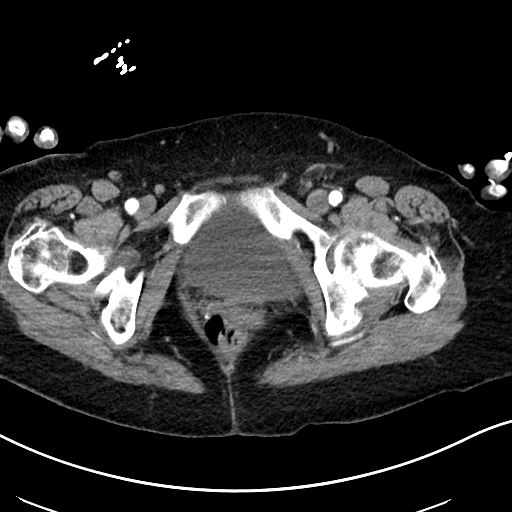
[im 13/130  lung]
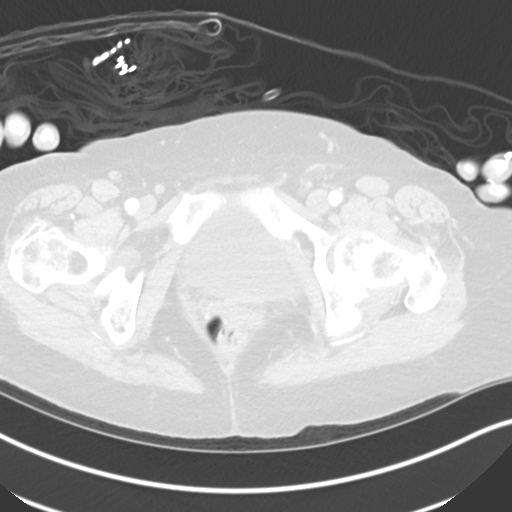
[im 26/130  lung]
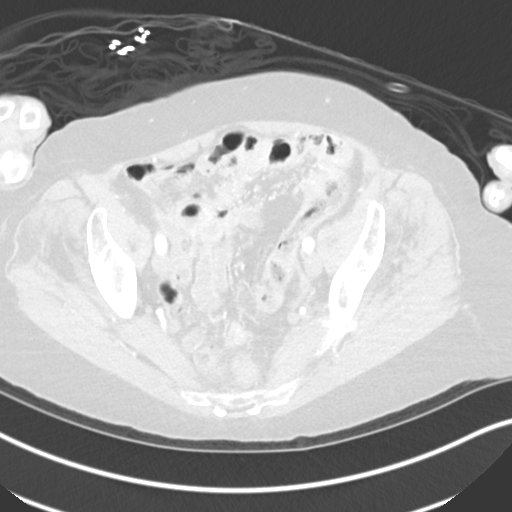
[im 39/130  lung]
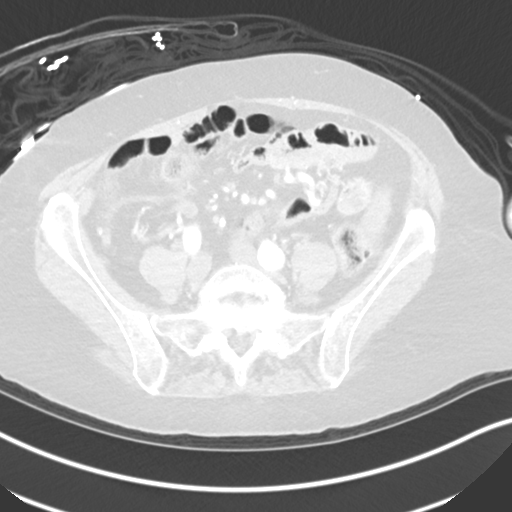
[im 52/130  lung]
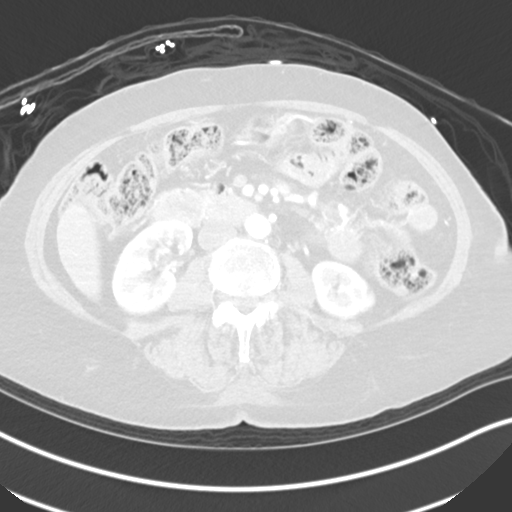
[im 78/130  mediastinal]
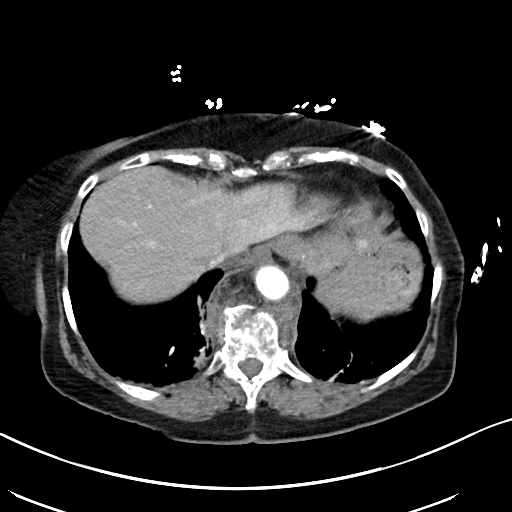
[im 78/130  lung]
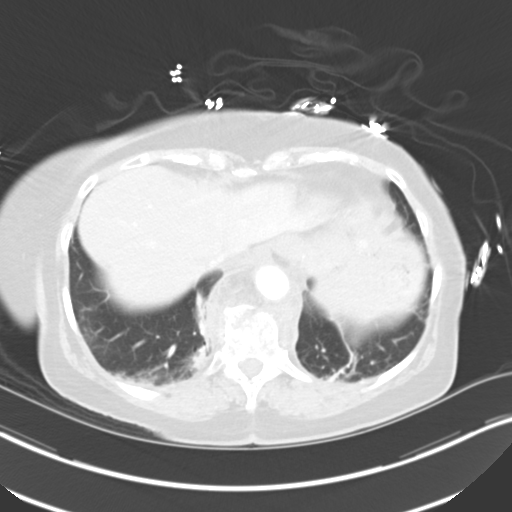
[im 91/130  lung]
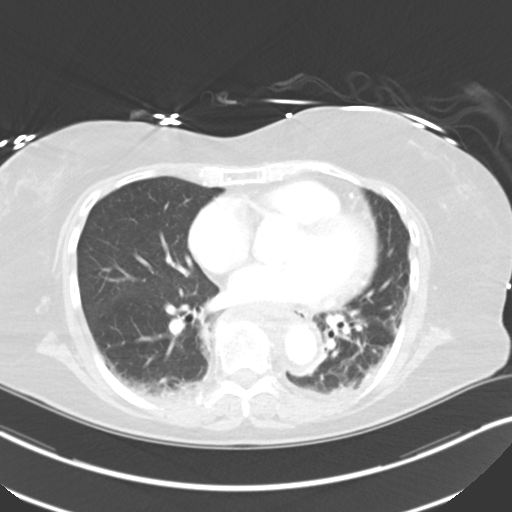
[im 104/130  lung]
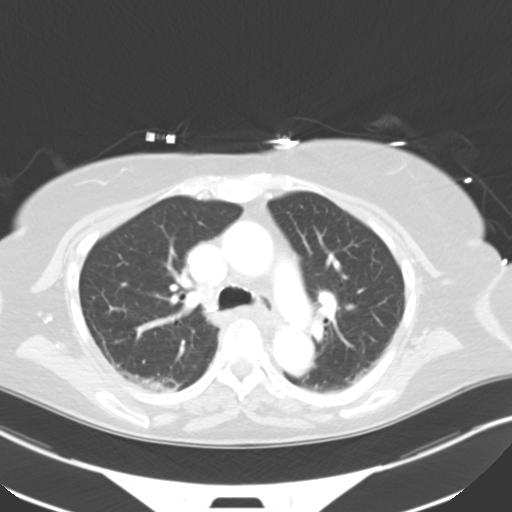
[im 117/130  lung]
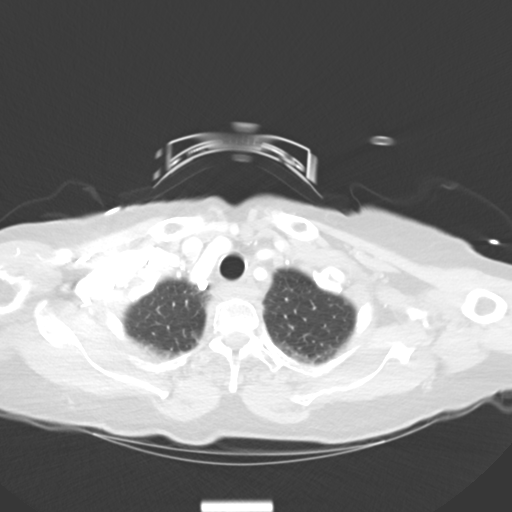

[Series 6: cor · coronal · 0.67mm/px · 3 of 82 slices shown]
[im 17/82  lung]
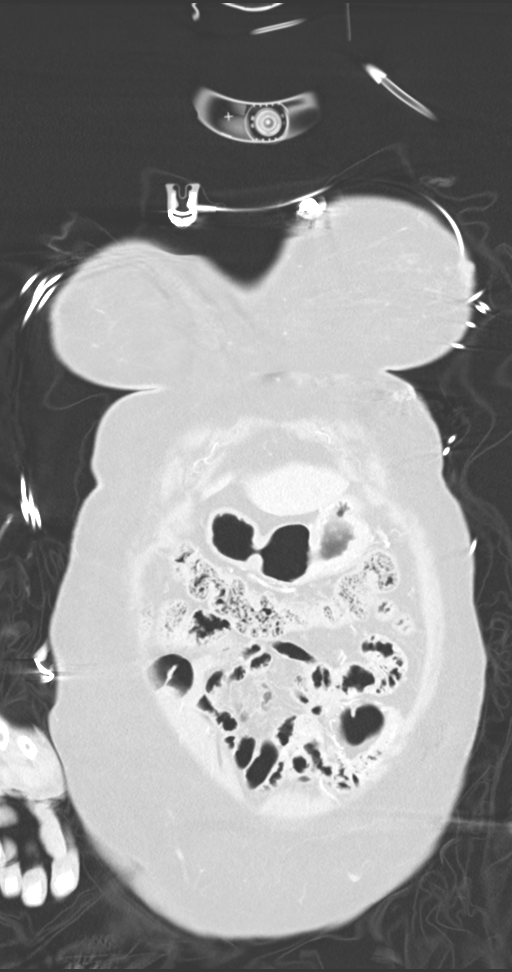
[im 33/82  lung]
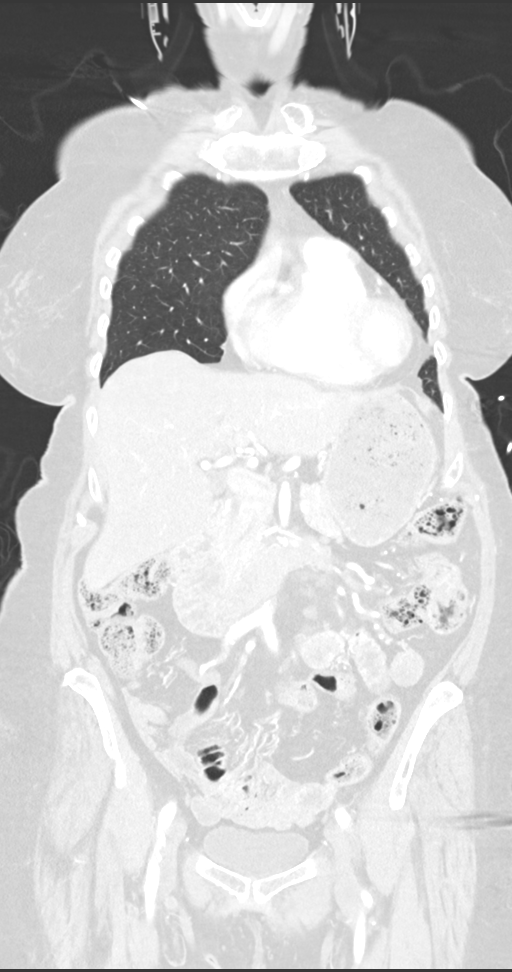
[im 49/82  lung]
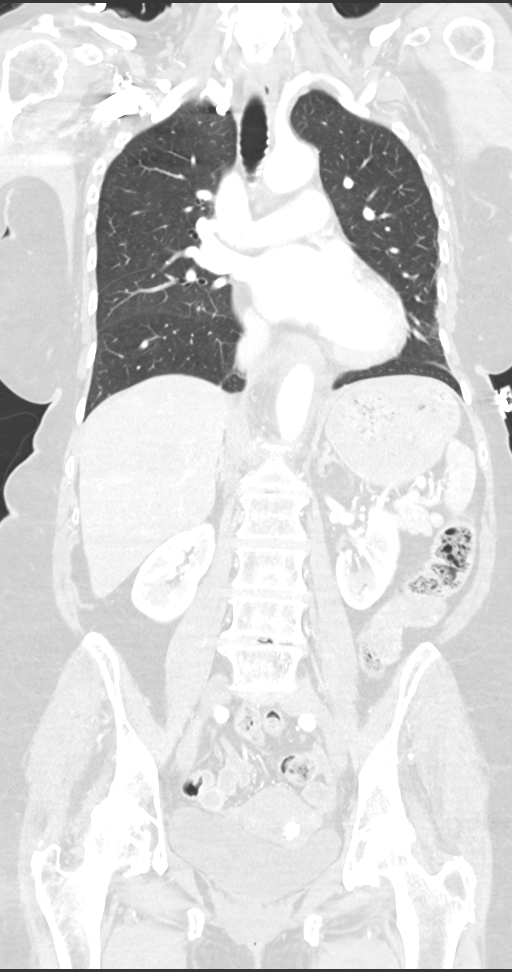

[11 of 36 positions shown; findings below may reference images not displayed]

FINDINGS: CT CHEST FINDINGS

Cardiovascular: No significant vascular findings. Normal heart size.
No pericardial effusion.

Mediastinum/Nodes: Subcentimeter nodule within the left lobe of the
thyroid gland. No mediastinal or axillary lymphadenopathy. Patent
central airways.

Lungs/Pleura: Lungs are clear. No pleural effusion or pneumothorax.

Musculoskeletal: No proximal humerus, clavicle, scapula, or rib
fracture identified.

CT ABDOMEN PELVIS FINDINGS

Hepatobiliary: No hepatic injury or perihepatic hematoma.
Gallbladder is unremarkable

Pancreas: Unremarkable. No pancreatic ductal dilatation or
surrounding inflammatory changes.

Spleen: No splenic injury or perisplenic hematoma.

Adrenals/Urinary Tract: No adrenal hemorrhage or renal injury
identified. Bladder is unremarkable. Subcentimeter renal cysts
bilaterally.

Stomach/Bowel: Stomach is within normal limits. Appendix appears
normal. No evidence of bowel wall thickening, distention, or
inflammatory changes.

Vascular/Lymphatic: Aortic atherosclerosis. No enlarged abdominal or
pelvic lymph nodes.

Reproductive: Calcified uterine myomas measuring up to 2.2 cm
extending anteriorly from the lower uterine segment. Uterus and
bilateral adnexa are otherwise unremarkable.

Other: No abdominal wall hernia or abnormality. No abdominopelvic
ascites.

Musculoskeletal: No proximal femur or pelvic fracture identified.

CT THORACIC SPINE FINDINGS

Alignment: Normal.

Vertebrae: Acute obliquely oriented fracture of the T11 vertebral
body extending from the anterior surface of the vertebral body to
the mid inferior endplate with minimal displacement. The fracture
does not propagate into the pedicles or posterior elements. No
associated listhesis. No appreciable epidural hematoma. No
additional acute fracture identified.

Paraspinal and other soft tissues: Extensive prevertebral edema is
present extending from the T6-L1 levels.

Disc levels: There are multilevel mild disc and facet degenerative
changes of the thoracic spine. No high-grade bony foraminal or canal
stenosis.

CT LUMBAR SPINE FINDINGS

Segmentation: 5 lumbar type vertebrae.

Alignment: L4-5 and L5-S1 grade 1 anterolisthesis with prominent
facet arthropathy. Slight T12-L1 retrolisthesis.

Vertebrae: No acute fracture or focal pathologic process of the
lumbar spine.

Paraspinal and other soft tissues: As above.

Disc levels: Moderate loss of intervertebral disc space height is
present at T12-L1 and L1-2. Mild loss of intervertebral disc space
height is present at L4-5 and L5-S1. There are multilevel disc
bulges which combined with facet hypertrophy result in
mild-to-moderate foraminal stenosis from L2 through S1. Facet
arthropathy is greatest at L4-5 and L5-S1. anterolisthesis combined
with disc and facet degenerative changes at L4-5 results in at least
moderate canal stenosis.
IMPRESSION: 1. Acute fracture through the anterior aspect of T11 vertebral body
and inferior endplate. No dislocation or listhesis. No appreciable
epidural hematoma. Extensive prevertebral edema extending from
T6-L1, likely related to fracture and soft tissue injury. Consider
thoracic MRI to assess for cord contusion.
2. No additional fracture or internal injury of chest, abdomen, or
pelvis identified.

These results were called by telephone at the time of interpretation
acknowledged these results.

By: Cocicaldi Musatau M.D.

## 2020-02-16 IMAGING — CT CT L-SPINE NO CHARGE
1 series · 1 of 1 positions shown · IV contrast (omnipaque)
Comparison: None.

CLINICAL DATA: 83 y/o F; fall with lacerations to the elbows and
the right-sided chin. Decreased sensation from the mid chest and
below. No movement, sensation, or response to painful stimuli of the
lower extremities.

EXAM:
CT CHEST, ABDOMEN, AND PELVIS WITH CONTRAST
CT THORACIC SPINE WITHOUT CONTRAST
CT LUMBAR SPINE WITHOUT CONTRAST
TECHNIQUE: Multidetector CT imaging of the chest, abdomen and pelvis was
performed following the standard protocol during bolus
administration of intravenous contrast. Multidetector CT imaging of
the thoracic and lumbar spine were reconstructed from the CT of
chest, abdomen, and pelvis images. Multiplanar CT image
reconstructions were also generated.
CONTRAST:  100mL OMNIPAQUE IOHEXOL 300 MG/ML  SOLN

[Series 1: topogram 0.6 tr20 · sagittal · 2.00mm/px · 1 of 1 slices shown]
[im 1/1]
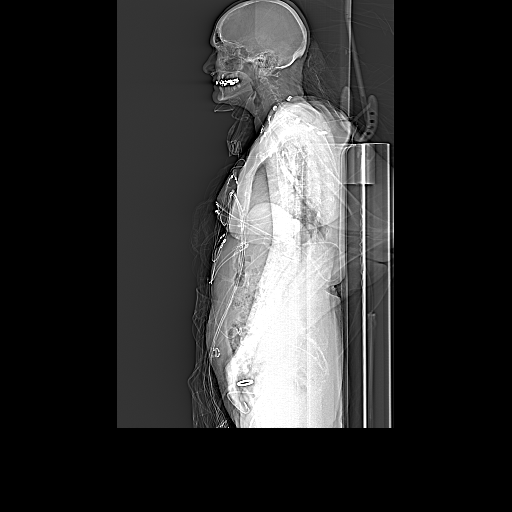

[1 of 1 positions shown; findings below may reference images not displayed]

FINDINGS: CT CHEST FINDINGS

Cardiovascular: No significant vascular findings. Normal heart size.
No pericardial effusion.

Mediastinum/Nodes: Subcentimeter nodule within the left lobe of the
thyroid gland. No mediastinal or axillary lymphadenopathy. Patent
central airways.

Lungs/Pleura: Lungs are clear. No pleural effusion or pneumothorax.

Musculoskeletal: No proximal humerus, clavicle, scapula, or rib
fracture identified.

CT ABDOMEN PELVIS FINDINGS

Hepatobiliary: No hepatic injury or perihepatic hematoma.
Gallbladder is unremarkable

Pancreas: Unremarkable. No pancreatic ductal dilatation or
surrounding inflammatory changes.

Spleen: No splenic injury or perisplenic hematoma.

Adrenals/Urinary Tract: No adrenal hemorrhage or renal injury
identified. Bladder is unremarkable. Subcentimeter renal cysts
bilaterally.

Stomach/Bowel: Stomach is within normal limits. Appendix appears
normal. No evidence of bowel wall thickening, distention, or
inflammatory changes.

Vascular/Lymphatic: Aortic atherosclerosis. No enlarged abdominal or
pelvic lymph nodes.

Reproductive: Calcified uterine myomas measuring up to 2.2 cm
extending anteriorly from the lower uterine segment. Uterus and
bilateral adnexa are otherwise unremarkable.

Other: No abdominal wall hernia or abnormality. No abdominopelvic
ascites.

Musculoskeletal: No proximal femur or pelvic fracture identified.

CT THORACIC SPINE FINDINGS

Alignment: Normal.

Vertebrae: Acute obliquely oriented fracture of the T11 vertebral
body extending from the anterior surface of the vertebral body to
the mid inferior endplate with minimal displacement. The fracture
does not propagate into the pedicles or posterior elements. No
associated listhesis. No appreciable epidural hematoma. No
additional acute fracture identified.

Paraspinal and other soft tissues: Extensive prevertebral edema is
present extending from the T6-L1 levels.

Disc levels: There are multilevel mild disc and facet degenerative
changes of the thoracic spine. No high-grade bony foraminal or canal
stenosis.

CT LUMBAR SPINE FINDINGS

Segmentation: 5 lumbar type vertebrae.

Alignment: L4-5 and L5-S1 grade 1 anterolisthesis with prominent
facet arthropathy. Slight T12-L1 retrolisthesis.

Vertebrae: No acute fracture or focal pathologic process of the
lumbar spine.

Paraspinal and other soft tissues: As above.

Disc levels: Moderate loss of intervertebral disc space height is
present at T12-L1 and L1-2. Mild loss of intervertebral disc space
height is present at L4-5 and L5-S1. There are multilevel disc
bulges which combined with facet hypertrophy result in
mild-to-moderate foraminal stenosis from L2 through S1. Facet
arthropathy is greatest at L4-5 and L5-S1. anterolisthesis combined
with disc and facet degenerative changes at L4-5 results in at least
moderate canal stenosis.
IMPRESSION: 1. Acute fracture through the anterior aspect of T11 vertebral body
and inferior endplate. No dislocation or listhesis. No appreciable
epidural hematoma. Extensive prevertebral edema extending from
T6-L1, likely related to fracture and soft tissue injury. Consider
thoracic MRI to assess for cord contusion.
2. No additional fracture or internal injury of chest, abdomen, or
pelvis identified.

These results were called by telephone at the time of interpretation
acknowledged these results.

By: Cocicaldi Musatau M.D.

## 2020-02-16 IMAGING — DX DG ELBOW COMPLETE LEFT (3+VIEW)
4 series · 4 of 4 positions shown · non-contrast
Comparison: None.

CLINICAL DATA: Fall from porch with laceration left elbow.

EXAM:
LEFT ELBOW - COMPLETE 3+ VIEW

[elbow ap]
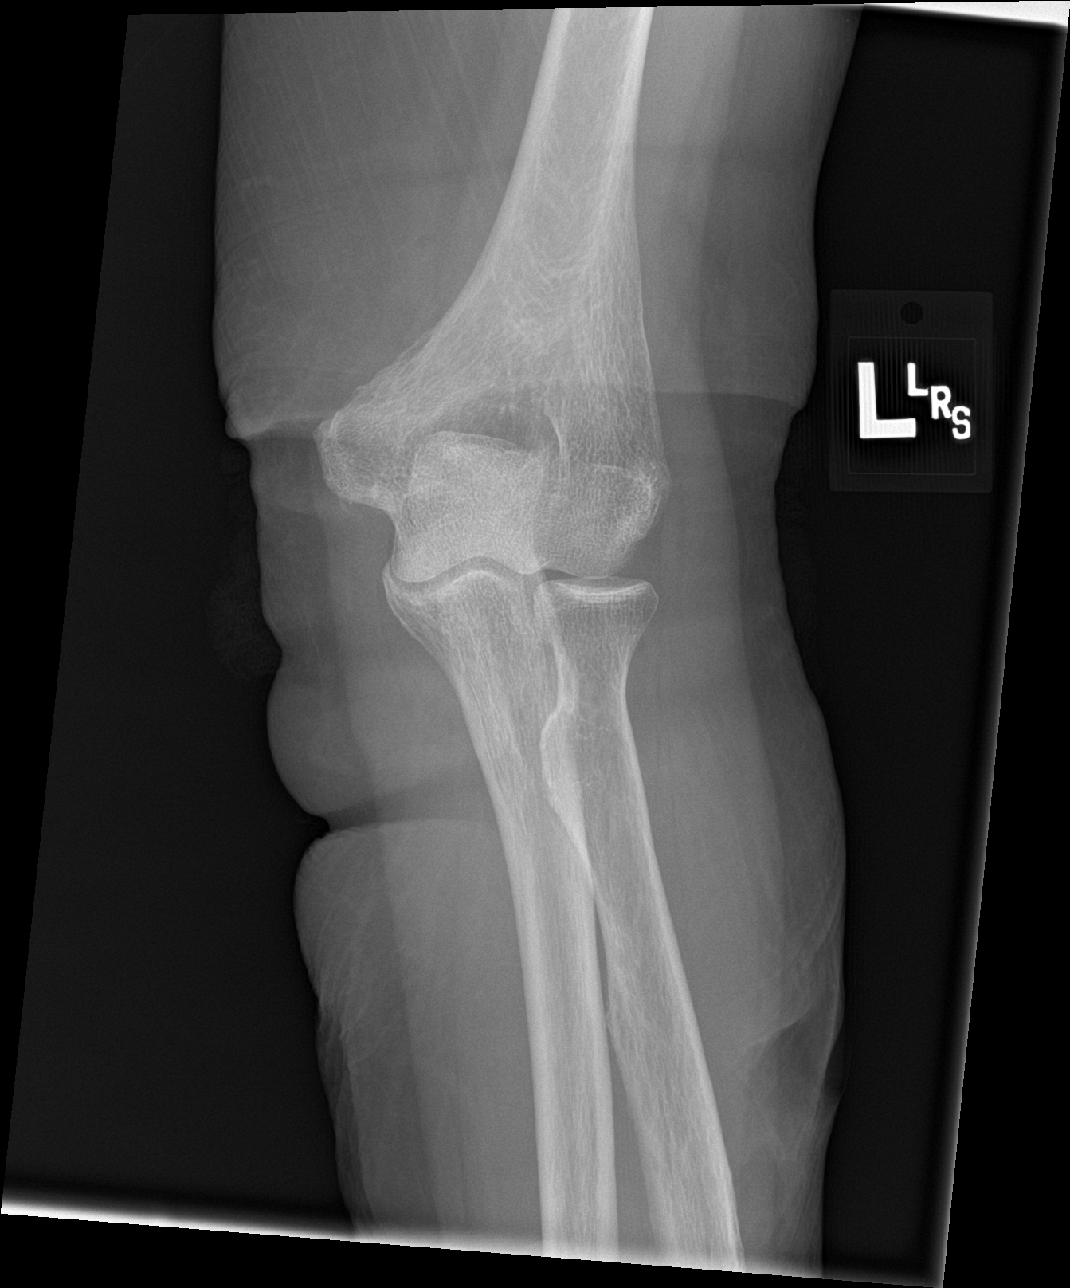

[elbow obl (1 of 2)]
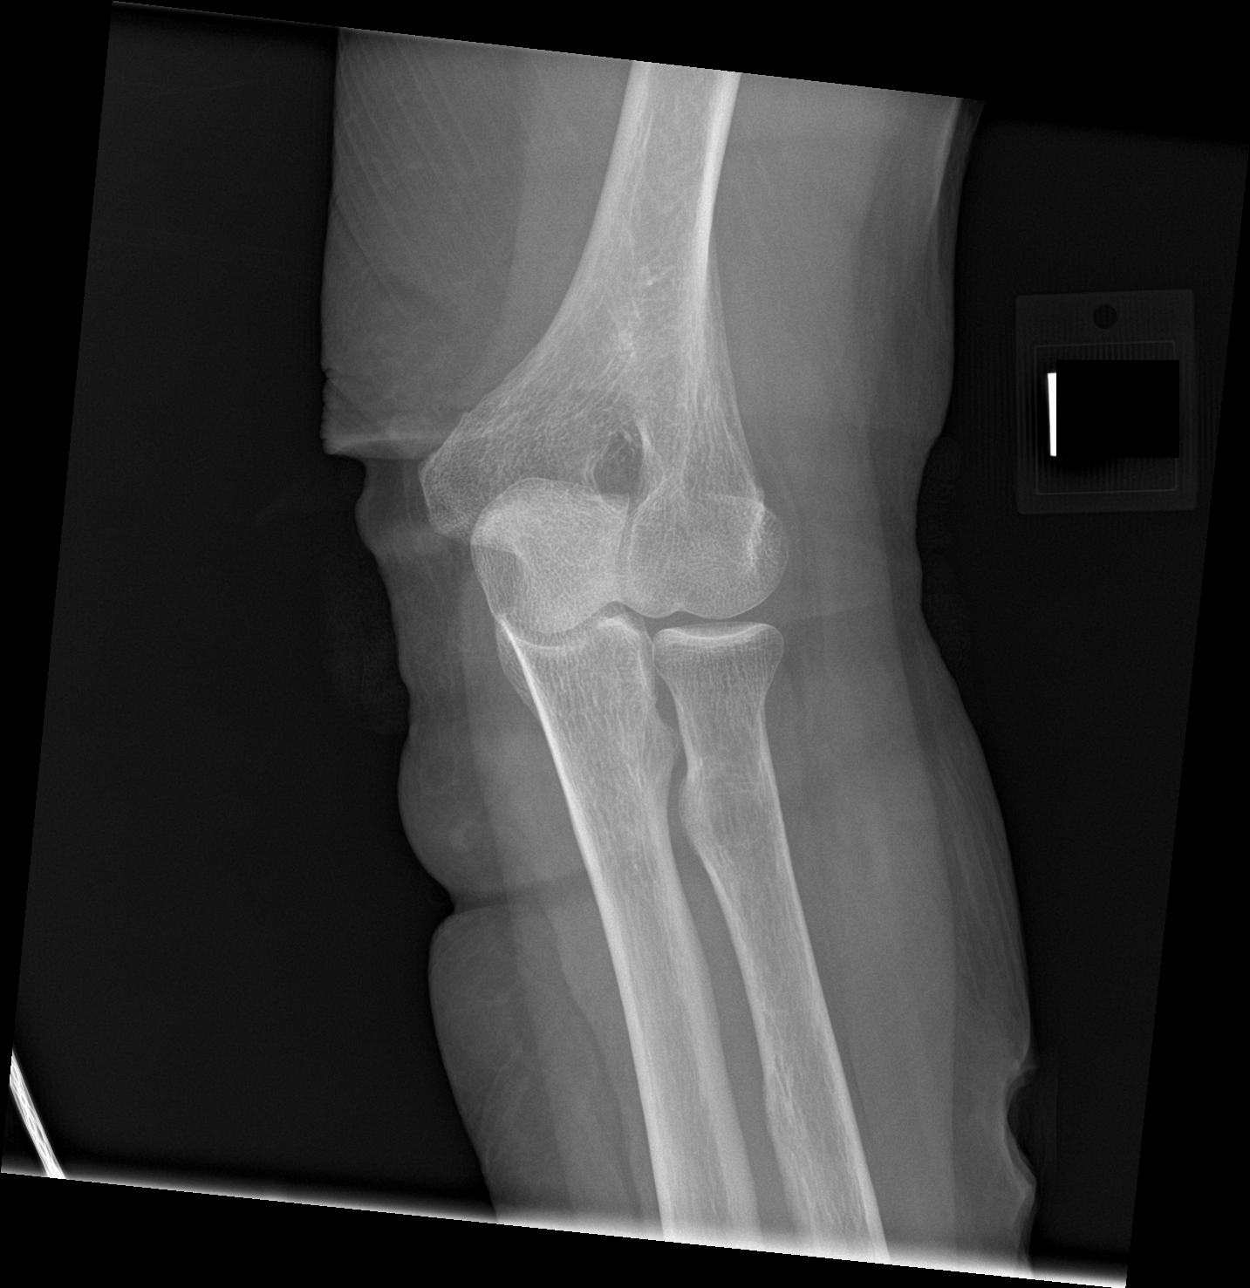

[elbow obl (2 of 2)]
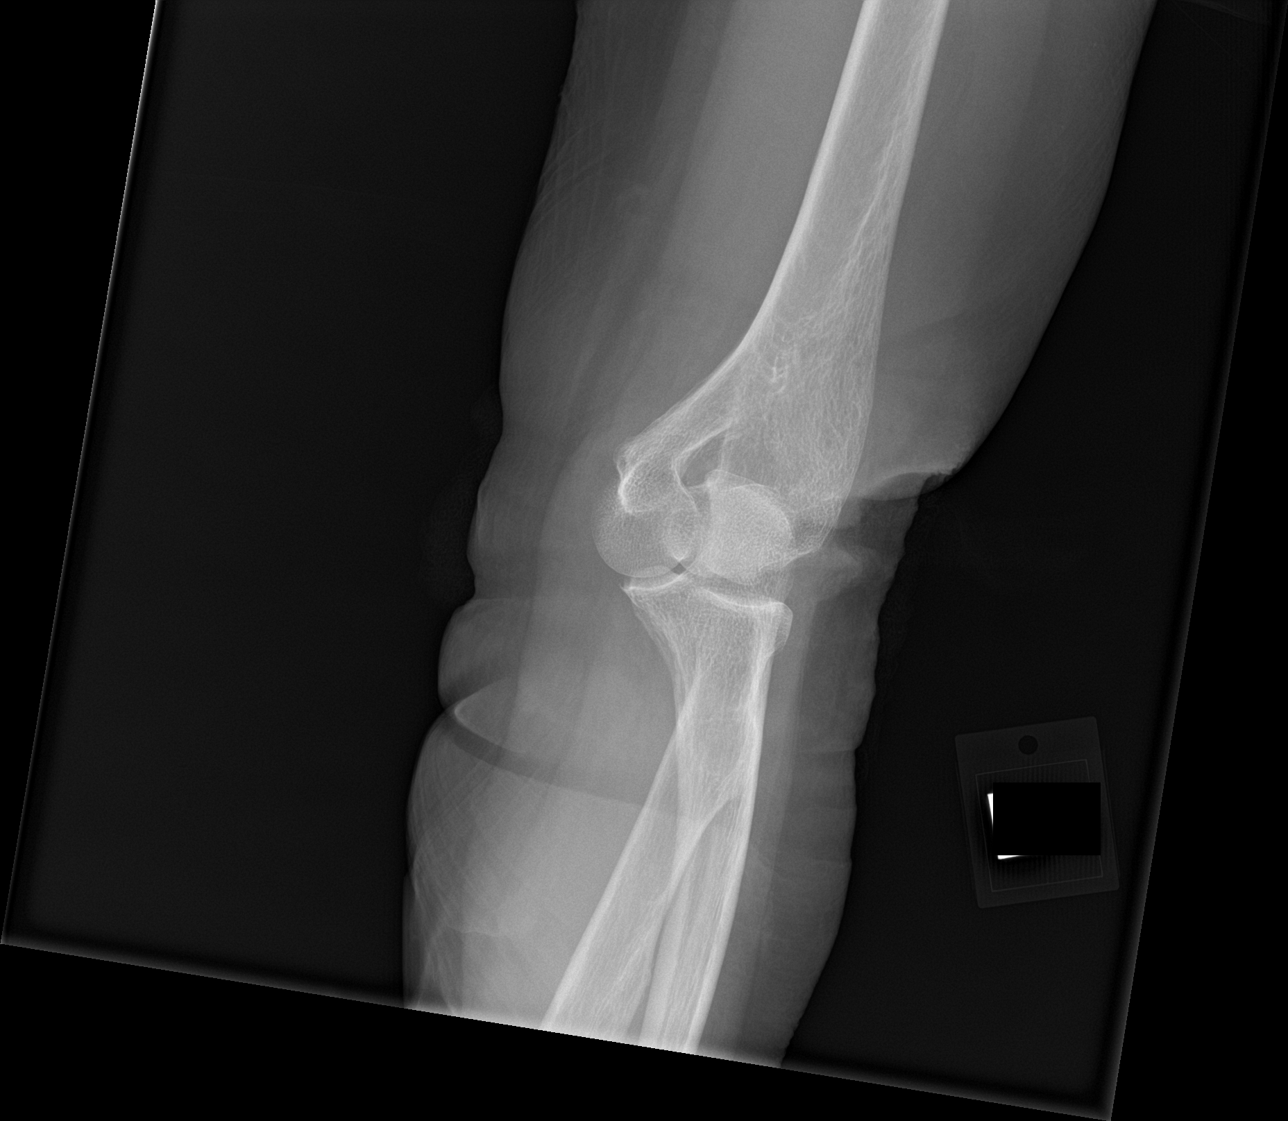

[elbow lat]
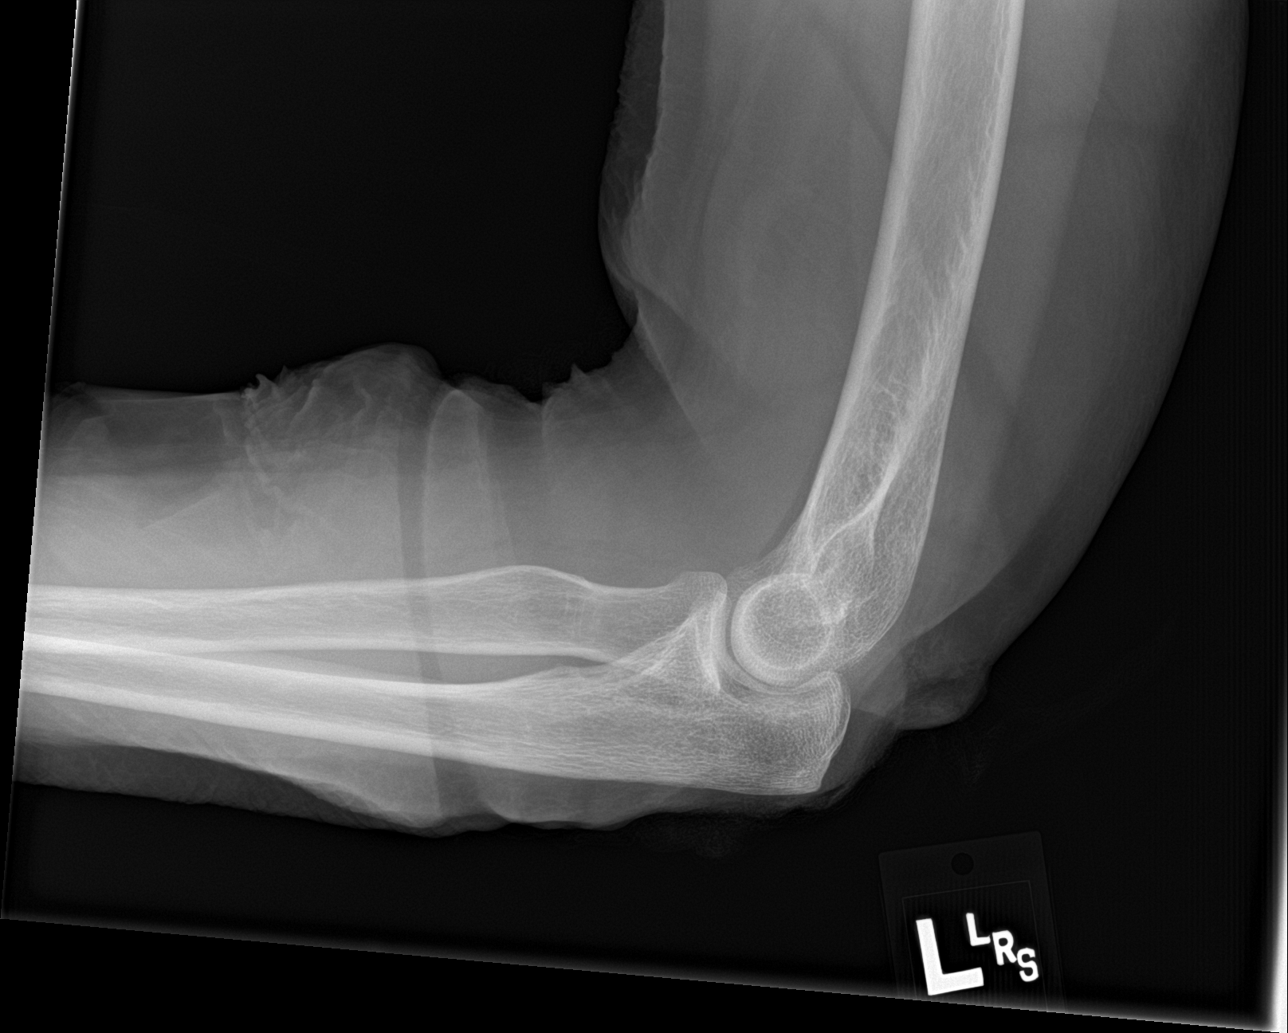

[4 of 4 positions shown; findings below may reference images not displayed]

FINDINGS: There is no evidence of fracture, dislocation, or joint effusion.
There is no evidence of arthropathy or other focal bone abnormality.
Soft tissues are unremarkable.
IMPRESSION: Negative.

## 2020-02-16 IMAGING — DX DG PELVIS PORTABLE
1 series · 1 of 1 positions shown · non-contrast
Comparison: None.

CLINICAL DATA: Fell off porch tonight.

EXAM:
PORTABLE PELVIS 1-2 VIEWS

[pelvis ap]
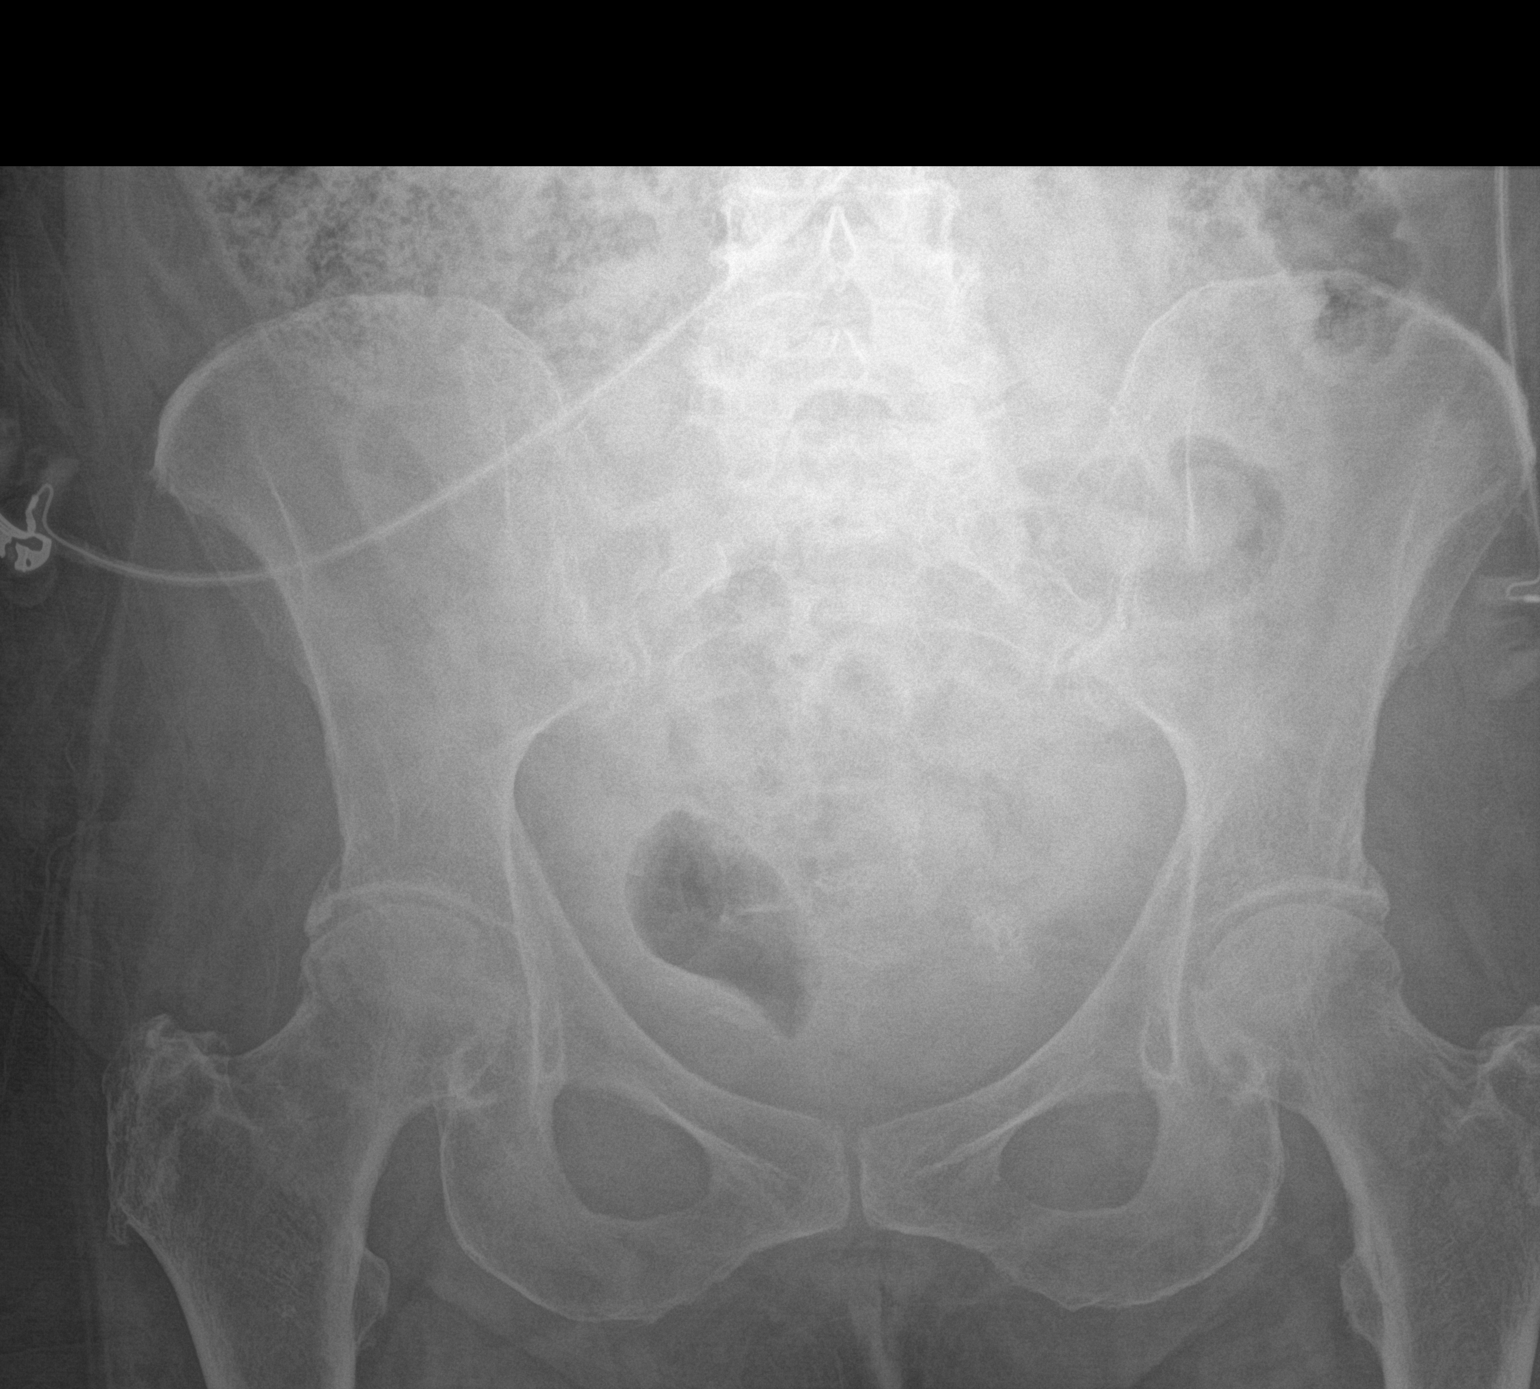

[1 of 1 positions shown; findings below may reference images not displayed]

FINDINGS: Diffuse osteopenia. Mild symmetric degenerative change of the hips.
Subtle lucency along the cortex of the lateral aspect of the left
femoral head as well as subtle linear lucency over the lateral
aspect of the right femoral head near the subcapital region as these
findings are likely within normal although cannot exclude
nondisplaced fractures. Degenerative change of the spine.
IMPRESSION: Subtle lucencies over the lateral aspect of the femoral heads
bilaterally likely within normal although cannot exclude fracture.
CT may be helpful for further evaluation.

Mild degenerative change of the hips and spine.

## 2020-02-18 ENCOUNTER — Encounter: Payer: Medicare HMO | Admitting: Occupational Therapy

## 2020-02-18 ENCOUNTER — Ambulatory Visit: Payer: Medicare HMO

## 2020-02-20 ENCOUNTER — Ambulatory Visit: Payer: Medicare HMO | Admitting: Occupational Therapy

## 2020-02-20 ENCOUNTER — Encounter: Payer: Self-pay | Admitting: Occupational Therapy

## 2020-02-20 ENCOUNTER — Ambulatory Visit: Payer: Medicare HMO | Admitting: Physical Therapy

## 2020-02-20 DIAGNOSIS — R2689 Other abnormalities of gait and mobility: Secondary | ICD-10-CM

## 2020-02-20 DIAGNOSIS — R278 Other lack of coordination: Secondary | ICD-10-CM

## 2020-02-20 DIAGNOSIS — M6281 Muscle weakness (generalized): Secondary | ICD-10-CM | POA: Diagnosis not present

## 2020-02-20 DIAGNOSIS — S14129S Central cord syndrome at unspecified level of cervical spinal cord, sequela: Secondary | ICD-10-CM

## 2020-02-20 DIAGNOSIS — R140 Abdominal distension (gaseous): Secondary | ICD-10-CM

## 2020-02-20 NOTE — Therapy (Signed)
[place] MAIN [facility] SERVICES [address] [place], [place], [address] Phone: [phone]   Fax:  [phone]  Occupational Therapy Treatment  Patient Details  Name: Sherry [name] MRN: [id number] Date of Birth: 05-Mar-1934 No data recorded  Encounter Date: 02/20/2020   OT End of Session - 02/20/20 1358    Visit Number 28    Number of Visits 5    Date for OT Re-Evaluation 02/27/20    Authorization Time Period Progress report period starting 01/14/2020    OT Start Time 1303    OT Stop Time 1345    OT Time Calculation (min) 42 min    Activity Tolerance Patient tolerated treatment well    Behavior During Therapy [facility] for tasks assessed/performed           Past Medical History:  Diagnosis Date  . Acute blood loss anemia   . Arthritis   . Cancer ([place])    skin  . Central cord syndrome at C6 level of cervical spinal cord ([place]) 11/29/2017  . Hypertension   . Protein-calorie malnutrition, severe ([place]) 01/24/2018  . S/P insertion of IVC (inferior vena caval) filter 01/24/2018  . 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[facility])     Past Surgical History:  Procedure Laterality Date  . ANTERIOR CERVICAL DECOMP/DISCECTOMY FUSION N/A 11/29/2017   Procedure: Cervical five-six, six-seven Anterior Cervical Decompression Fusion;  Surgeon: Judith Part, MD;  Location: [place];  Service: Neurosurgery;  Laterality: N/A;  Cervical five-six, six-seven Anterior Cervical Decompression Fusion  . CATARACT EXTRACTION    . IR IVC FILTER PLMT / S&I /IMG GUID/MOD SED  12/08/2017    There were no vitals filed for this visit.   Subjective Assessment - 02/20/20 1357    Subjective  Pt. reports doirg okay today.    Patient is accompanied by: Family member    Pertinent History Patient s/p fall November 29, 2017 resulting in diagnosis of central cord syndrome at C 6 level.  She has had therapy in multiple venues but with recent move to [facility], therapy staff recommended she seek  outpatient therapy for her needs.    Patient Stated Goals Patient would like to be able to move better in bed, stand and perform self care tasks.    Currently in Pain? No/denies           OT Treatment  Pt.tolerated bilateral shoulder flexion, extension, abduction, elbow flexion, extension forearm supination AAROM/AROM. PROM bilateral wrist extension, digit MP, PIP, and DIP flexion, and extension in conjunction with moist heat.Pt. worked on reaching using the [employer]. Pt. grasped and moved the shapes through 3 vertical dowels of progressively increasing heights.  Pt.continues to make steadyprogress. Pt. required 2 hands to move the shapes over the 2nd, and 3rd vertical dowel. Pt.continues to beable to complete additional repswith both the right, and left.Pt.continues to engageher right hand whenperforming self-feeding with adpativeequipment.Pt.continues to beable to use her right thumb to hold items. Pt. tolerated ROM well without reports of pain, or discomfort. Pt.continues to benefit from working on improving ROM in order to work towards increasing engagement of her bilateral hands during ADLs, and IADL tasks.                      OT Education - 02/20/20 1357    Education Details UE ROM    Person(s) Educated Patient;Caregiver(s)    Methods Explanation;Demonstration    Comprehension Verbalized understanding;Need further instruction  OT Long Term Goals - 01/09/20 2134      OT LONG TERM GOAL #1   Title Patient and caregiver will demonstrate understanding of home exercise program for ROM.    Baseline Pt. has a restorative aide rehab aide assist her wih exercises at [facility]    Time 12    Period Weeks    Status On-going    Target Date 02/27/20      OT LONG TERM GOAL #2   Title Patient will demonstrate ability to don shirt with min assist from seated position.    Baseline indepedent with a larger shirt. Requires increased  time to complete    Time 12    Period Weeks    Status On-going    Target Date 02/27/20      OT LONG TERM GOAL #3   Title Patient will demonstrate improved sitting balance at the edge of the bed to participate in self care tasks.    Baseline Pt. has an air mattress at [facility]    Time 12    Period Weeks    Status Deferred      OT LONG TERM GOAL #4   Title Patient will demonstrate improved composite finger flexion to hold deodorant to apply to underarms.    Baseline Pt. continues to present with improving finger flexion of R hand to use partial cylindrical grasp, but unable to grasp and hold items. Very limited digit flexion of L hand.    Time 12    Period Weeks    Status On-going    Target Date 02/27/20      OT LONG TERM GOAL #6   Title Patient will increase right UE ROM to comb the back of her hair with modified independence.    Baseline Pt. is able to comb the front, and sides of her head. Pt. can now reach the back, however cannot see what she is doing in the back of her head.    Time 12    Period Weeks    Status Partially Met    Target Date 02/27/20      OT LONG TERM GOAL #8   Title Pt. will write, and sign her name with 100% legibility, and modified independence.    Baseline Pt. is able to maintain grasp on a wide width pen. [place] to work on [employer].    Time 12    Period Weeks    Status On-going    Target Date 02/27/20                 Plan - 02/20/20 1358    Clinical Impression Statement Pt.continues to make steadyprogress. Pt. required 2 hands to move the shapes over the 2nd, and 3rd vertical dowel. Pt.Continues to beable to complete additional repswith both the right, and left.Pt.continues to engageher right hand whenperforming self-feeding with adpativeequipment.Pt.continues to beable to use her right thumb to hold items. Pt. tolerated ROM well without reports of pain, or discomfort. Pt.continues to benefit from working on  improving ROM in order to work towards increasing engagement of her bilateral hands during ADLs, and IADL tasks.    OT Occupational Profile and History Detailed Assessment- Review of Records and additional review of physical, cognitive, psychosocial history related to current functional performance    Occupational performance deficits (Please refer to evaluation for details): ADL's;IADL's;Leisure;Social Participation    Body Structure / Function / Physical Skills ADL;Continence;Dexterity;Flexibility;Strength;ROM;Balance;Coordination;FMC;IADL;Endurance;UE functional use;Decreased knowledge of use of [facility]    Psychosocial Skills  Environmental  Adaptations;Habits;Routines and Behaviors    Rehab Potential Fair    Clinical Decision Making Limited treatment options, no task modification necessary    Comorbidities Affecting Occupational Performance: Presence of comorbidities impacting occupational performance    Comorbidities impacting occupational performance description: contractures of bilateral hands, dependent transfers,    Modification or Assistance to Complete Evaluation  No modification of tasks or assist necessary to complete eval    OT Frequency 2x / week    OT Duration 12 weeks    OT Treatment/Interventions Self-care/ADL training;Cryotherapy;Therapeutic exercise;DME and/or AE instruction;Balance training;Neuromuscular education;Manual Therapy;Splinting;Moist Heat;Passive range of motion;Therapeutic activities;Patient/family education    Plan continue to progress ROM of digits, wrists and shoulders as it pertains to completion of ADL tasks that pt values.    Consulted and Agree with Plan of Care Patient           Patient will benefit from skilled therapeutic intervention in order to improve the following deficits and impairments:   Body Structure / Function / Physical Skills: ADL,Continence,Dexterity,Flexibility,Strength,ROM,Balance,Coordination,FMC,IADL,Endurance,UE functional  use,Decreased knowledge of use of [facility]   Psychosocial Skills: Environmental  Adaptations,Habits,Routines and Behaviors   Visit Diagnosis: Muscle weakness (generalized)  Other lack of coordination    Problem List Patient Active Problem List   Diagnosis Date Noted  . Acute appendicitis with appendiceal abscess 05/30/2018  . Hypocalcemia 02/15/2018  . Dysuria 02/15/2018  . Bilateral lower extremity edema 02/11/2018  . Vaginal yeast infection 01/30/2018  . Chest tightness 01/27/2018  . Healthcare-associated pneumonia 01/25/2018  . Pleural effusion, not elsewhere classified 01/25/2018  . Acute deep vein thrombosis (DVT) of left lower extremity ([place]) 01/24/2018  . Acute deep vein thrombosis (DVT) of right lower extremity ([place]) 01/24/2018  . Chronic allergic rhinitis 01/24/2018  . Depression with anxiety 01/24/2018  . UTI due to Klebsiella species 01/24/2018  . Protein-calorie malnutrition, severe ([place]) 01/24/2018  . S/P insertion of IVC (inferior vena caval) filter 01/24/2018  . Reactive depression   . Hypokalemia   . Leukocytosis   . Essential hypertension   . Trauma   . 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([place])   . Neuropathic pain   . Neurogenic bowel   . Neurogenic bladder   . Benign essential HTN   . Acute blood loss anemia   . Central cord syndrome at C6 level of cervical spinal cord ([place]) 11/29/2017  . Central cord syndrome [facility]) 11/29/2017    Harrel Carina, MS, OTR/L 02/20/2020, 2:01 PM  [place] MAIN [facility] SERVICES [address] [place], [place], [address] Phone: [phone]   Fax:  [phone]  Name: Sherry [name] MRN: [id number] Date of Birth: 01/07/1935

## 2020-02-20 NOTE — Therapy (Signed)
[name] MAIN [facility] SERVICES [address] [place], [place], [address] Phone: [phone]   Fax:  [phone]  Physical Therapy Treatment  Patient Details  Name: Sherry [name] MRN: [id number] Date of Birth: December 03, 1934 Referring Provider (PT): Viviana Simpler   Encounter Date: 02/20/2020   PT End of Session - 02/20/20 1302    Visit Number 35    Number of Visits 54    Date for PT Re-Evaluation 04/07/20    Authorization Type aetna medicare FOTO performed by PT on eval (7/20), score 12    PT Start Time 1345    PT Stop Time 1430    PT Time Calculation (min) 45 min    Equipment Utilized During Treatment Other (comment)   lift   Activity Tolerance Patient tolerated treatment well;No increased pain    Behavior During Therapy WFL for tasks assessed/performed           Past Medical History:  Diagnosis Date  . Acute blood loss anemia   . Arthritis   . Cancer ([place])    skin  . Central cord syndrome at C6 level of cervical spinal cord ([place]) 11/29/2017  . Hypertension   . Protein-calorie malnutrition, severe ([place]) 01/24/2018  . S/P insertion of IVC (inferior vena caval) filter 01/24/2018  . 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[facility])     Past Surgical History:  Procedure Laterality Date  . ANTERIOR CERVICAL DECOMP/DISCECTOMY FUSION N/A 11/29/2017   Procedure: Cervical five-six, six-seven Anterior Cervical Decompression Fusion;  Surgeon: Judith Part, MD;  Location: [place];  Service: Neurosurgery;  Laterality: N/A;  Cervical five-six, six-seven Anterior Cervical Decompression Fusion  . CATARACT EXTRACTION    . IR IVC FILTER PLMT / S&I /IMG GUID/MOD SED  12/08/2017    There were no vitals filed for this visit.   Subjective Assessment - 02/20/20 1352    Subjective Patient reports that she is tired today due to lack of sleep last night. She states she is not sore or no injuries since last therapy session.    Patient is accompained by: --   [name] (aide)    Pertinent History Pt is an 86 yo female that fell in 2019, fractured vertebrae in her neck and in her low back per family. 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 After hospital stay, pt discharged to [facility] for ~1 month, experienced severe UTI as well as bilateral DVT (IVC filter placed). Discharged to [facility], stayed for about a year, and then moved to [facility] in April 2021. Was receiving PT, but per family facility reported that did not have adequate equipment to maximize PT for her. Pt until about 1 month ago was practicing sit to stand transfers with 1-2 people, and working on static standing. Has a brace for L foot due to PF. Pt currently needs assistance with all ADLs (able to assist with donning/doffing her shirt), bed baths, and needs a hoyer lift for transfers. Able to drive power wheelchair without assistance.    Limitations Standing;Walking;House hold activities;Lifting    How long can you sit comfortably? NA    How long can you stand comfortably? unable    How long can you walk comfortably? unable    Patient Stated Goals "to stand up and walk"    Currently in Pain? No/denies           Hoyer lift x WC<> mat   Therex:  Dependent rolling and supine to sit.   Seated: Weighted yellow ball out/up/diagonals x  5 reps each direction. Core twists holding yellow ball x 2 10 reps  LAQ x therapist provides maxA for RLE and independent with left LE x 10 reps with verbal cues for slow motion for improved muscle control.  Heel raises (right LE only) x 10 reps Toe raises (right LE only) x 10 reps Scap retraction using green theraband x 10 reps Glut squeezes x 30 reps  Clinical Impression: Patient completed strength training with fair tolerance to activity. She demonstrates muscle fatigue with BLE strengthening exercises due to decreased functional activity capacity. She completed weighted trunk activity with fair balance. Patient is able to self correct herself when  reaching out of BOS in unsupported sitting. She demonstrates increased independence with pushing up from mat when completing supine to sit transfer. Patient will continue to benefit from skilled physical therapy to improve generalized strength, ROM, and capacity for functional activity.       PT Short Term Goals - 01/28/20 1453      PT SHORT TERM GOAL #1   Title The patient will perform initial HEP with minimum assistance in order to improve strength and function.    Time 4    Period Weeks    Status On-going    Target Date 02/11/20             PT Long Term Goals - 01/28/20 1454      PT LONG TERM GOAL #1   Title The patient will be compliant with finalized HEP with minimum assistance in preparation for self management and maintenance of condition.    Time 12    Period Weeks    Status On-going    Target Date 04/07/20      PT LONG TERM GOAL #2   Title The patient will demonstrate at least 10 point improvement on FOTO score indicating an improved ability to perform functional activities.    Baseline on eval (/720) score 12; 10/22/19: 12; 12/10/19: 18, 12/13: 18    Time 12    Period Weeks    Status On-going    Target Date 04/07/20      PT LONG TERM GOAL #3   Title The patient will demonstrate lateral scooting  for assistance with transfers with minA to maximize independence.    Baseline 10/22/19: Pt requires modA+1 for lateral scooting; 12/10/19: modA+1, 12/13: modA-maxA, 12/27: modA-maxA    Time 12    Period Weeks    Status On-going    Target Date 04/07/20      PT LONG TERM GOAL #4   Title The patient will demonstrate a squat pivot transfer with minimum assistance to maximize independence and mobility.    Baseline 10/22/19: unable to safely attempt at this time; 12/10/19: unable to safely attempt at this time, 01/14/20: unable to safety attempt at this time    Time 12    Period Weeks    Status On-going    Target Date 04/07/20      PT LONG TERM GOAL #5   Title The patient  will demonstrate sitting without UE support for 2-5 minutes at EOB to improve participation and maximize independence with ADLs.    Baseline 10/22/19: Pt able ot sit unsupported for at least 2 minutes at edge of bed    Time 12    Period Weeks    Status Achieved                 Plan - 02/20/20 1429    Clinical Impression Statement Patient completed  strength training with fair tolerance to activity. She demonstrates muscle fatigue with BLE strengthening exercises due to decreased functional activity capacity. She completed weighted trunk activity with fair balance. Patient is able to self correct herself when reaching out of BOS in unsupported sitting. She demonstrates increased independence with pushing up from mat when completing supine to sit transfer. Patient will continue to benefit from skilled physical therapy to improve generalized strength, ROM, and capacity for functional activity.    Personal Factors and Comorbidities Age;Time since onset of injury/illness/exacerbation;Comorbidity 3+;Fitness    Comorbidities HTN, quadriparesis, history of DVT, neurogenic bladder    Examination-Activity Limitations Bathing;Hygiene/Grooming;Squat;Bed Mobility;Lift;Bend;Stand;Engineer, manufacturing;Toileting;Self Feeding;Transfers;Continence;Sit;Dressing;Sleep;Carry    Examination-Participation Restrictions Church;Laundry;Cleaning;Medication Management;Community Activity;Meal Prep;Interpersonal Relationship    Stability/Clinical Decision Making Evolving/Moderate complexity    Rehab Potential Fair    PT Frequency 2x / week    PT Duration 12 weeks    PT Treatment/Interventions ADLs/Self Care Home Management;Electrical Stimulation;Therapeutic activities;Wheelchair mobility training;Vasopneumatic Device;Joint Manipulations;Vestibular;Passive range of motion;Patient/family education;Therapeutic exercise;DME Instruction;Biofeedback;Aquatic Therapy;Moist Heat;Gait training;Balance training;Orthotic  Fit/Training;Dry needling;Energy conservation;Taping;Splinting;Neuromuscular re-education;Cryotherapy;Ultrasound;Functional mobility training    PT Next Visit Plan sitting balance/endurance    PT Home Exercise Plan sitting balance unsupported hold 30-45sec with aide    Consulted and Agree with Plan of Care Patient    Family Member Guinda           Patient will benefit from skilled therapeutic intervention in order to improve the following deficits and impairments:  Abnormal gait,Decreased balance,Decreased endurance,Decreased mobility,Difficulty walking,Hypomobility,Impaired sensation,Decreased range of motion,Improper body mechanics,Impaired perceived functional ability,Decreased activity tolerance,Decreased knowledge of use of DME,Decreased safety awareness,Decreased strength,Impaired flexibility,Impaired UE functional use,Postural dysfunction  Visit Diagnosis: Muscle weakness (generalized)  Other abnormalities of gait and mobility  Abdominal distention  Central cord syndrome, sequela ([place])  Other lack of coordination     Problem List Patient Active Problem List   Diagnosis Date Noted  . Acute appendicitis with appendiceal abscess 05/30/2018  . Hypocalcemia 02/15/2018  . Dysuria 02/15/2018  . Bilateral lower extremity edema 02/11/2018  . Vaginal yeast infection 01/30/2018  . Chest tightness 01/27/2018  . Healthcare-associated pneumonia 01/25/2018  . Pleural effusion, not elsewhere classified 01/25/2018  . Acute deep vein thrombosis (DVT) of left lower extremity ([name]) 01/24/2018  . Acute deep vein thrombosis (DVT) of right lower extremity ([place]) 01/24/2018  . Chronic allergic rhinitis 01/24/2018  . Depression with anxiety 01/24/2018  . UTI due to Klebsiella species 01/24/2018  . Protein-calorie malnutrition, severe ([place]) 01/24/2018  . S/P insertion of IVC (inferior vena caval) filter 01/24/2018  . Reactive depression   . Hypokalemia   . Leukocytosis   .  Essential hypertension   . Trauma   . 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([place])   . Neuropathic pain   . Neurogenic bowel   . Neurogenic bladder   . Benign essential HTN   . Acute blood loss anemia   . Central cord syndrome at C6 level of cervical spinal cord ([place]) 11/29/2017  . Central cord syndrome [facility]) 11/29/2017   Karl Luke PT, DPT Netta Corrigan 02/20/2020, 2:30 PM  [place] MAIN [facility] SERVICES [address] [place], [place], [address] Phone: [phone]   Fax:  [phone]  Name: DAN DISSINGER MRN: [id number] Date of Birth: 1934-07-31

## 2020-02-25 ENCOUNTER — Ambulatory Visit: Payer: Medicare HMO | Admitting: Physical Therapy

## 2020-02-25 ENCOUNTER — Ambulatory Visit: Payer: Medicare HMO | Admitting: Occupational Therapy

## 2020-02-25 ENCOUNTER — Other Ambulatory Visit: Payer: Self-pay

## 2020-02-25 ENCOUNTER — Encounter: Payer: Self-pay | Admitting: Occupational Therapy

## 2020-02-25 DIAGNOSIS — M6281 Muscle weakness (generalized): Secondary | ICD-10-CM

## 2020-02-25 DIAGNOSIS — R278 Other lack of coordination: Secondary | ICD-10-CM

## 2020-02-25 DIAGNOSIS — R140 Abdominal distension (gaseous): Secondary | ICD-10-CM

## 2020-02-25 DIAGNOSIS — O2230 Deep phlebothrombosis in pregnancy, unspecified trimester: Secondary | ICD-10-CM

## 2020-02-25 DIAGNOSIS — S14129S Central cord syndrome at unspecified level of cervical spinal cord, sequela: Secondary | ICD-10-CM

## 2020-02-25 DIAGNOSIS — R2689 Other abnormalities of gait and mobility: Secondary | ICD-10-CM

## 2020-02-25 NOTE — Therapy (Signed)
[place] MAIN [facility] SERVICES [address] [place], [place], [address] Phone: [phone]   Fax:  [phone]  Occupational Therapy Treatment  Patient Details  Name: Sherry [name] MRN: [id number] Date of Birth: Feb 19, 1934 No data recorded  Encounter Date: 02/25/2020   OT End of Session - 02/25/20 1329    Visit Number 29    Number of Visits 2    Date for OT Re-Evaluation 02/27/20    Authorization Time Period Progress report period starting 01/14/2020    OT Start Time 1145    OT Stop Time 1230    OT Time Calculation (min) 45 min    Activity Tolerance Patient tolerated treatment well    Behavior During Therapy [facility] for tasks assessed/performed           Past Medical History:  Diagnosis Date  . Acute blood loss anemia   . Arthritis   . Cancer ([place])    skin  . Central cord syndrome at C6 level of cervical spinal cord ([place]) 11/29/2017  . Hypertension   . Protein-calorie malnutrition, severe ([place]) 01/24/2018  . S/P insertion of IVC (inferior vena caval) filter 01/24/2018  . 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[facility])     Past Surgical History:  Procedure Laterality Date  . ANTERIOR CERVICAL DECOMP/DISCECTOMY FUSION N/A 11/29/2017   Procedure: Cervical five-six, six-seven Anterior Cervical Decompression Fusion;  Surgeon: Judith Part, MD;  Location: [place];  Service: Neurosurgery;  Laterality: N/A;  Cervical five-six, six-seven Anterior Cervical Decompression Fusion  . CATARACT EXTRACTION    . IR IVC FILTER PLMT / S&I /IMG GUID/MOD SED  12/08/2017    There were no vitals filed for this visit.   Subjective Assessment - 02/25/20 1328    Subjective  Pt. reports doirg well today.    Patient is accompanied by: Family member    Pertinent History Patient s/p fall November 29, 2017 resulting in diagnosis of central cord syndrome at C 6 level.  She has had therapy in multiple venues but with recent move to [facility], therapy staff recommended she seek  outpatient therapy for her needs.    Currently in Pain? No/denies          OT Treatment  Pt.tolerated bilateral shoulder flexion, extension, abduction, elbow flexion, extension forearm supination AAROM/AROM. PROM bilateral wrist extension, digit MP, PIP, and DIP flexion, and extension in conjunction with moist heat.Pt. worked on reaching using the [employer]. Pt. grasped and moved the shapes through 3 vertical dowels of progressively increasingheights.  Pt.continues to make steadyprogress.Pt. required 2 hands to move the shapes over the 2nd, and 3rd vertical dowel with with improved efficiency. Pt. reports pt.continues to beable to complete additional repswith both the right, and left.Pt.continues to engageher right hand whenperforming self-feeding with adpativeequipment.Pt.continues to beable to use her right thumb to hold items. Pt. tolerated ROM well without reports of pain, or discomfort. Pt.continues to benefit from working on improving ROM in order to work towards increasing engagement of her bilateral hands during ADLs, and IADL tasks.                      OT Education - 02/25/20 1329    Education Details UE ROM    Person(s) Educated Patient;Caregiver(s)    Methods Explanation;Demonstration    Comprehension Verbalized understanding;Need further instruction               OT Long Term Goals - 01/09/20 2134      OT LONG  TERM GOAL #1   Title Patient and caregiver will demonstrate understanding of home exercise program for ROM.    Baseline Pt. has a restorative aide rehab aide assist her wih exercises at [facility]    Time 12    Period Weeks    Status On-going    Target Date 02/27/20      OT LONG TERM GOAL #2   Title Patient will demonstrate ability to don shirt with min assist from seated position.    Baseline indepedent with a larger shirt. Requires increased time to complete    Time 12    Period Weeks    Status On-going    Target  Date 02/27/20      OT LONG TERM GOAL #3   Title Patient will demonstrate improved sitting balance at the edge of the bed to participate in self care tasks.    Baseline Pt. has an air mattress at [facility]    Time 12    Period Weeks    Status Deferred      OT LONG TERM GOAL #4   Title Patient will demonstrate improved composite finger flexion to hold deodorant to apply to underarms.    Baseline Pt. continues to present with improving finger flexion of R hand to use partial cylindrical grasp, but unable to grasp and hold items. Very limited digit flexion of L hand.    Time 12    Period Weeks    Status On-going    Target Date 02/27/20      OT LONG TERM GOAL #6   Title Patient will increase right UE ROM to comb the back of her hair with modified independence.    Baseline Pt. is able to comb the front, and sides of her head. Pt. can now reach the back, however cannot see what she is doing in the back of her head.    Time 12    Period Weeks    Status Partially Met    Target Date 02/27/20      OT LONG TERM GOAL #8   Title Pt. will write, and sign her name with 100% legibility, and modified independence.    Baseline Pt. is able to maintain grasp on a wide width pen. [place] to work on Media planner.    Time 12    Period Weeks    Status On-going    Target Date 02/27/20                 Plan - 02/25/20 1330    Clinical Impression Statement Pt.continues to make steadyprogress.Pt. required 2 hands to move the shapes over the 2nd, and 3rd vertical dowel with with improved efficiency. Pt. reports pt.continues to beable to complete additional repswith both the right, and left.Pt.continues to engageher right hand whenperforming self-feeding with adpativeequipment.Pt.continues to beable to use her right thumb to hold items. Pt. tolerated ROM well without reports of pain, or discomfort. Pt.continues to benefit from working on improving ROM in order to work towards  increasing engagement of her bilateral hands during ADLs, and IADL tasks.   OT Occupational Profile and History Detailed Assessment- Review of Records and additional review of physical, cognitive, psychosocial history related to current functional performance    Occupational performance deficits (Please refer to evaluation for details): ADL's;IADL's;Leisure;Social Participation    Body Structure / Function / Physical Skills ADL;Continence;Dexterity;Flexibility;Strength;ROM;Balance;Coordination;FMC;IADL;Endurance;UE functional use;Decreased knowledge of use of DME;GMC    Psychosocial Skills Environmental  Adaptations;Habits;Routines and Behaviors    Rehab Potential  Fair    Clinical Decision Making Limited treatment options, no task modification necessary    Comorbidities Affecting Occupational Performance: Presence of comorbidities impacting occupational performance    Comorbidities impacting occupational performance description: contractures of bilateral hands, dependent transfers,    Modification or Assistance to Complete Evaluation  No modification of tasks or assist necessary to complete eval    OT Frequency 2x / week    OT Duration 12 weeks    OT Treatment/Interventions Self-care/ADL training;Cryotherapy;Therapeutic exercise;DME and/or AE instruction;Balance training;Neuromuscular education;Manual Therapy;Splinting;Moist Heat;Passive range of motion;Therapeutic activities;Patient/family education    Consulted and Agree with Plan of Care Patient           Patient will benefit from skilled therapeutic intervention in order to improve the following deficits and impairments:   Body Structure / Function / Physical Skills: ADL,Continence,Dexterity,Flexibility,Strength,ROM,Balance,Coordination,FMC,IADL,Endurance,UE functional use,Decreased knowledge of use of [facility]   Psychosocial Skills: Environmental  Adaptations,Habits,Routines and Behaviors   Visit Diagnosis: Muscle weakness  (generalized)  Other lack of coordination    Problem List Patient Active Problem List   Diagnosis Date Noted  . Acute appendicitis with appendiceal abscess 05/30/2018  . Hypocalcemia 02/15/2018  . Dysuria 02/15/2018  . Bilateral lower extremity edema 02/11/2018  . Vaginal yeast infection 01/30/2018  . Chest tightness 01/27/2018  . Healthcare-associated pneumonia 01/25/2018  . Pleural effusion, not elsewhere classified 01/25/2018  . Acute deep vein thrombosis (DVT) of left lower extremity ([place]) 01/24/2018  . Acute deep vein thrombosis (DVT) of right lower extremity ([place]) 01/24/2018  . Chronic allergic rhinitis 01/24/2018  . Depression with anxiety 01/24/2018  . UTI due to Klebsiella species 01/24/2018  . Protein-calorie malnutrition, severe ([place]) 01/24/2018  . S/P insertion of IVC (inferior vena caval) filter 01/24/2018  . Reactive depression   . Hypokalemia   . Leukocytosis   . Essential hypertension   . Trauma   . 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([place])   . Neuropathic pain   . Neurogenic bowel   . Neurogenic bladder   . Benign essential HTN   . Acute blood loss anemia   . Central cord syndrome at C6 level of cervical spinal cord ([place]) 11/29/2017  . Central cord syndrome [facility]) 11/29/2017    Harrel Carina, MS, OTR/L 02/25/2020, 1:32 PM  [place] MAIN [facility] SERVICES [address] [place], [place], [address] Phone: [phone]   Fax:  [phone]  Name: Sherry [name] MRN: [id number] Date of Birth: Jul 28, 1934

## 2020-02-25 NOTE — Therapy (Signed)
[place] MAIN [facility] SERVICES [address] [place], [place], [address] Phone: [phone]   Fax:  [phone]  Physical Therapy Treatment  Patient Details  Name: Sherry [name] MRN: [id number] Date of Birth: Sep 18, 1934 Referring Provider (PT): Viviana Simpler   Encounter Date: 02/25/2020   PT End of Session - 02/25/20 1115    Visit Number 36    Number of Visits 54    Date for PT Re-Evaluation 04/07/20    Authorization Type aetna medicare FOTO performed by PT on eval (7/20), score 12    PT Start Time 1100    PT Stop Time 1145    PT Time Calculation (min) 45 min    Equipment Utilized During Treatment Other (comment)   lift   Activity Tolerance Patient tolerated treatment well;No increased pain    Behavior During Therapy WFL for tasks assessed/performed           Past Medical History:  Diagnosis Date  . Acute blood loss anemia   . Arthritis   . Cancer ([place])    skin  . Central cord syndrome at C6 level of cervical spinal cord ([place]) 11/29/2017  . Hypertension   . Protein-calorie malnutrition, severe ([place]) 01/24/2018  . S/P insertion of IVC (inferior vena caval) filter 01/24/2018  . 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[facility])     Past Surgical History:  Procedure Laterality Date  . ANTERIOR CERVICAL DECOMP/DISCECTOMY FUSION N/A 11/29/2017   Procedure: Cervical five-six, six-seven Anterior Cervical Decompression Fusion;  Surgeon: Judith Part, MD;  Location: [place];  Service: Neurosurgery;  Laterality: N/A;  Cervical five-six, six-seven Anterior Cervical Decompression Fusion  . CATARACT EXTRACTION    . IR IVC FILTER PLMT / S&I /IMG GUID/MOD SED  12/08/2017    There were no vitals filed for this visit.   Subjective Assessment - 02/25/20 1116    Subjective Patient denies of any new symptoms or pain since last therapy session.    Patient is accompained by: --   [name] (aide)   Pertinent History Pt is an 86 yo female that fell in 2019, fractured  vertebrae in her neck and in her low back per family. 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 After hospital stay, pt discharged to CIR for ~1 month, experienced severe UTI as well as bilateral DVT (IVC filter placed). Discharged to [facility], stayed for about a year, and then moved to [facility] in April 2021. Was receiving PT, but per family facility reported that did not have adequate equipment to maximize PT for her. Pt until about 1 month ago was practicing sit to stand transfers with 1-2 people, and working on static standing. Has a brace for L foot due to PF. Pt currently needs assistance with all ADLs (able to assist with donning/doffing her shirt), bed baths, and needs a hoyer lift for transfers. Able to drive power wheelchair without assistance.    Limitations Standing;Walking;House hold activities;Lifting    How long can you sit comfortably? NA    How long can you stand comfortably? unable    How long can you walk comfortably? unable    Patient Stated Goals "to stand up and walk"    Currently in Pain? No/denies           Hoyer lift x WC<> mat   Therex:  Dependent rolling and supine to sit.   Seated: Weighted yellow ball out/up/diagonals x 15, x 10 reps each direction. Core twists holding yellow ball x 2 15 reps  LAQ x therapist provides maxA for RLE and independent with left LE x 10 reps with verbal cues for slow motion for improved muscle control.  Heel raises (right LE only) x 10 reps Toe raises (right LE only) x 10 reps Scap retraction using green theraband x 10 reps Glut squeezes x 30 reps  Clinical Impression: Patient completed strengthening and postural seated balance exercises with fair tolerance to activity. She reports of increased sharp pain along her R anterior deltoid muscle belly with horizontal adduction. She states she thinks it's due to the cold weather. Patient completed all exercises in unsupported sitting with fair+ balance and is able  to self-correct to maintain COG within BOS. Patient will continue to benefit from skilled physical therapy to improve generalized strength, ROM, and capacity for functional activity.          PT Short Term Goals - 01/28/20 1453      PT SHORT TERM GOAL #1   Title The patient will perform initial HEP with minimum assistance in order to improve strength and function.    Time 4    Period Weeks    Status On-going    Target Date 02/11/20             PT Long Term Goals - 01/28/20 1454      PT LONG TERM GOAL #1   Title The patient will be compliant with finalized HEP with minimum assistance in preparation for self management and maintenance of condition.    Time 12    Period Weeks    Status On-going    Target Date 04/07/20      PT LONG TERM GOAL #2   Title The patient will demonstrate at least 10 point improvement on FOTO score indicating an improved ability to perform functional activities.    Baseline on eval (/720) score 12; 10/22/19: 12; 12/10/19: 18, 12/13: 18    Time 12    Period Weeks    Status On-going    Target Date 04/07/20      PT LONG TERM GOAL #3   Title The patient will demonstrate lateral scooting  for assistance with transfers with minA to maximize independence.    Baseline 10/22/19: Pt requires modA+1 for lateral scooting; 12/10/19: modA+1, 12/13: modA-maxA, 12/27: modA-maxA    Time 12    Period Weeks    Status On-going    Target Date 04/07/20      PT LONG TERM GOAL #4   Title The patient will demonstrate a squat pivot transfer with minimum assistance to maximize independence and mobility.    Baseline 10/22/19: unable to safely attempt at this time; 12/10/19: unable to safely attempt at this time, 01/14/20: unable to safety attempt at this time    Time 12    Period Weeks    Status On-going    Target Date 04/07/20      PT LONG TERM GOAL #5   Title The patient will demonstrate sitting without UE support for 2-5 minutes at EOB to improve participation and  maximize independence with ADLs.    Baseline 10/22/19: Pt able ot sit unsupported for at least 2 minutes at edge of bed    Time 12    Period Weeks    Status Achieved                 Plan - 02/25/20 1155    Clinical Impression Statement Patient completed strengthening and postural seated balance exercises with fair tolerance to activity. She reports  of increased sharp pain along her R anterior deltoid muscle belly with horizontal adduction. She states she thinks it's due to the cold weather. Patient completed all exercises in unsupported sitting with fair+ balance and is able to self-correct to maintain COG within BOS. Patient will continue to benefit from skilled physical therapy to improve generalized strength, ROM, and capacity for functional activity.    Personal Factors and Comorbidities Age;Time since onset of injury/illness/exacerbation;Comorbidity 3+;Fitness    Comorbidities HTN, quadriparesis, history of DVT, neurogenic bladder    Examination-Activity Limitations Bathing;Hygiene/Grooming;Squat;Bed Mobility;Lift;Bend;Stand;Engineer, manufacturing;Toileting;Self Feeding;Transfers;Continence;Sit;Dressing;Sleep;Carry    Examination-Participation Restrictions Church;Laundry;Cleaning;Medication Management;Community Activity;Meal Prep;Interpersonal Relationship    Stability/Clinical Decision Making Evolving/Moderate complexity    Rehab Potential Fair    PT Frequency 2x / week    PT Duration 12 weeks    PT Treatment/Interventions ADLs/Self Care Home Management;Electrical Stimulation;Therapeutic activities;Wheelchair mobility training;Vasopneumatic Device;Joint Manipulations;Vestibular;Passive range of motion;Patient/family education;Therapeutic exercise;DME Instruction;Biofeedback;Aquatic Therapy;Moist Heat;Gait training;Balance training;Orthotic Fit/Training;Dry needling;Energy conservation;Taping;Splinting;Neuromuscular re-education;Cryotherapy;Ultrasound;Functional mobility  training    PT Next Visit Plan sitting balance/endurance    PT Home Exercise Plan sitting balance unsupported hold 30-45sec with aide    Consulted and Agree with Plan of Care Patient    Family Member [place]           Patient will benefit from skilled therapeutic intervention in order to improve the following deficits and impairments:  Abnormal gait,Decreased balance,Decreased endurance,Decreased mobility,Difficulty walking,Hypomobility,Impaired sensation,Decreased range of motion,Improper body mechanics,Impaired perceived functional ability,Decreased activity tolerance,Decreased knowledge of use of DME,Decreased safety awareness,Decreased strength,Impaired flexibility,Impaired UE functional use,Postural dysfunction  Visit Diagnosis: Muscle weakness (generalized)  Other abnormalities of gait and mobility  Abdominal distention  Central cord syndrome, sequela (HCC)  Other lack of coordination  DVT (deep vein thrombosis) in pregnancy     Problem List Patient Active Problem List   Diagnosis Date Noted  . Acute appendicitis with appendiceal abscess 05/30/2018  . Hypocalcemia 02/15/2018  . Dysuria 02/15/2018  . Bilateral lower extremity edema 02/11/2018  . Vaginal yeast infection 01/30/2018  . Chest tightness 01/27/2018  . Healthcare-associated pneumonia 01/25/2018  . Pleural effusion, not elsewhere classified 01/25/2018  . Acute deep vein thrombosis (DVT) of left lower extremity ([place]) 01/24/2018  . Acute deep vein thrombosis (DVT) of right lower extremity ([place]) 01/24/2018  . Chronic allergic rhinitis 01/24/2018  . Depression with anxiety 01/24/2018  . UTI due to Klebsiella species 01/24/2018  . Protein-calorie malnutrition, severe ([place]) 01/24/2018  . S/P insertion of IVC (inferior vena caval) filter 01/24/2018  . Reactive depression   . Hypokalemia   . Leukocytosis   . Essential hypertension   . Trauma   . 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([place])   . Neuropathic pain   .  Neurogenic bowel   . Neurogenic bladder   . Benign essential HTN   . Acute blood loss anemia   . Central cord syndrome at C6 level of cervical spinal cord ([place]) 11/29/2017  . Central cord syndrome [facility]) 11/29/2017   Karl Luke PT, DPT Netta Corrigan 02/25/2020, 11:57 AM  [place] MAIN [facility] SERVICES [address] [place], [place], [address] Phone: [phone]   Fax:  [phone]  Name: Sherry [name] MRN: [id number] Date of Birth: 05-Jun-1934

## 2020-02-27 ENCOUNTER — Ambulatory Visit: Payer: Medicare HMO | Admitting: Occupational Therapy

## 2020-02-27 ENCOUNTER — Ambulatory Visit: Payer: Medicare HMO

## 2020-02-27 ENCOUNTER — Other Ambulatory Visit: Payer: Self-pay

## 2020-02-27 ENCOUNTER — Encounter: Payer: Self-pay | Admitting: Occupational Therapy

## 2020-02-27 DIAGNOSIS — M6281 Muscle weakness (generalized): Secondary | ICD-10-CM

## 2020-02-27 DIAGNOSIS — R278 Other lack of coordination: Secondary | ICD-10-CM

## 2020-02-27 DIAGNOSIS — R140 Abdominal distension (gaseous): Secondary | ICD-10-CM

## 2020-02-27 DIAGNOSIS — S14129S Central cord syndrome at unspecified level of cervical spinal cord, sequela: Secondary | ICD-10-CM

## 2020-02-27 DIAGNOSIS — R2689 Other abnormalities of gait and mobility: Secondary | ICD-10-CM

## 2020-02-27 DIAGNOSIS — O2230 Deep phlebothrombosis in pregnancy, unspecified trimester: Secondary | ICD-10-CM

## 2020-02-27 NOTE — Therapy (Signed)
[place] MAIN [facility] SERVICES [address] [place], [place], [address] Phone: [phone]   Fax:  [phone]  Physical Therapy Treatment  Patient Details  Name: Sherry [name] MRN: [id number] Date of Birth: [birth year] Referring Provider (PT): Viviana Simpler   Encounter Date: 02/27/2020   PT End of Session - 02/27/20 1333    Visit Number 37    Number of Visits 54    Date for PT Re-Evaluation 04/07/20    Authorization Type aetna medicare FOTO performed by PT on eval (7/20), score 12    PT Start Time 1346    PT Stop Time 1432    PT Time Calculation (min) 46 min    Equipment Utilized During Treatment Other (comment);Gait belt   lift   Activity Tolerance Patient tolerated treatment well;No increased pain;Patient limited by fatigue    Behavior During Therapy [facility] for tasks assessed/performed           Past Medical History:  Diagnosis Date  . Acute blood loss anemia   . Arthritis   . Cancer ([place])    skin  . Central cord syndrome at C6 level of cervical spinal cord ([place]) 11/29/2017  . Hypertension   . Protein-calorie malnutrition, severe ([place]) 01/24/2018  . S/P insertion of IVC (inferior vena caval) filter 01/24/2018  . 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[facility])     Past Surgical History:  Procedure Laterality Date  . ANTERIOR CERVICAL DECOMP/DISCECTOMY FUSION N/A 11/29/2017   Procedure: Cervical five-six, six-seven Anterior Cervical Decompression Fusion;  Surgeon: Judith Part, MD;  Location: [place];  Service: Neurosurgery;  Laterality: N/A;  Cervical five-six, six-seven Anterior Cervical Decompression Fusion  . CATARACT EXTRACTION    . IR IVC FILTER PLMT / S&I /IMG GUID/MOD SED  12/08/2017    There were no vitals filed for this visit.   Subjective Assessment - 02/27/20 1445    Subjective Patient reports having a good day. She states her restoration aide is back to helping her on the days she does not have therapy.    Patient is accompained by:  --   [name] (aide)   Pertinent History Pt is an 86 yo female that fell in 2019, fractured vertebrae in her neck and in her low back per family. 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 After hospital stay, pt discharged to CIR for ~1 month, experienced severe UTI as well as bilateral DVT (IVC filter placed). Discharged to [facility], stayed for about a year, and then moved to [facility] in April 2021. Was receiving PT, but per family facility reported that did not have adequate equipment to maximize PT for her. Pt until about 1 month ago was practicing sit to stand transfers with 1-2 people, and working on static standing. Has a brace for L foot due to PF. Pt currently needs assistance with all ADLs (able to assist with donning/doffing her shirt), bed baths, and needs a hoyer lift for transfers. Able to drive power wheelchair without assistance.    Limitations Standing;Walking;House hold activities;Lifting    How long can you sit comfortably? NA    How long can you stand comfortably? unable    How long can you walk comfortably? unable    Patient Stated Goals "to stand up and walk"    Currently in Pain? No/denies                Hoyer lift x WC<> mat    Therex: Ankle pumps on right x 15 reps Quad  sets x 3 sec hold bilaterally x 10 reps Assisted heel slides x 12 reps each leg Assisted hip abd x 12 reps each leg Assisted bridging X 12 reps (PT holding ankles/feet to stabilize)     Bed mobility training:  Rolling Left to right x 5 trials and right to left- Min assist required with LE only and patient able to independently mobilize her UE's to reach for edge of mat. She required verbal and visual cues to perform correctly.     Seated: LAQ x therapist provides maxA for RLE and independent with left LE x 15 reps with verbal cues for slow motion for improved muscle control.  Hip march with max assist on right and mod assist on left LE x 10 reps each.   Neuro-re  education:  Static sitting at edge of mat table x 3 min followed by some dynamic seated activities (above mentioned therex) and the following: Bilateral UE shoulder flex focusing on postural control x 10 reps. Dynamic UE Reaching x 3 min  Patient was hoyered back from mat to chair at end of session.                    PT Education - 02/27/20 1448    Education Details Reviewed supine therex and emphasized importance of compliance.    Person(s) Educated Patient;Caregiver(s)    Methods Explanation;Demonstration;Tactile cues;Verbal cues    Comprehension Verbalized understanding;Returned demonstration;Verbal cues required;Tactile cues required;Need further instruction            PT Short Term Goals - 01/28/20 1453      PT SHORT TERM GOAL #1   Title The patient will perform initial HEP with minimum assistance in order to improve strength and function.    Time 4    Period Weeks    Status On-going    Target Date 02/11/20             PT Long Term Goals - 01/28/20 1454      PT LONG TERM GOAL #1   Title The patient will be compliant with finalized HEP with minimum assistance in preparation for self management and maintenance of condition.    Time 12    Period Weeks    Status On-going    Target Date 04/07/20      PT LONG TERM GOAL #2   Title The patient will demonstrate at least 10 point improvement on FOTO score indicating an improved ability to perform functional activities.    Baseline on eval (/720) score 12; 10/22/19: 12; 12/10/19: 18, 12/13: 18    Time 12    Period Weeks    Status On-going    Target Date 04/07/20      PT LONG TERM GOAL #3   Title The patient will demonstrate lateral scooting  for assistance with transfers with minA to maximize independence.    Baseline 10/22/19: Pt requires modA+1 for lateral scooting; 12/10/19: modA+1, 12/13: modA-maxA, 12/27: modA-maxA    Time 12    Period Weeks    Status On-going    Target Date 04/07/20      PT LONG  TERM GOAL #4   Title The patient will demonstrate a squat pivot transfer with minimum assistance to maximize independence and mobility.    Baseline 10/22/19: unable to safely attempt at this time; 12/10/19: unable to safely attempt at this time, 01/14/20: unable to safety attempt at this time    Time 12    Period Weeks  Status On-going    Target Date 04/07/20      PT LONG TERM GOAL #5   Title The patient will demonstrate sitting without UE support for 2-5 minutes at EOB to improve participation and maximize independence with ADLs.    Baseline 10/22/19: Pt able ot sit unsupported for at least 2 minutes at edge of bed    Time 12    Period Weeks    Status Achieved                 Plan - 02/27/20 1452    Clinical Impression Statement Patient able to demonstrate improvement with bed mobility with decreased physical assist with rolling from max assist to min assist with LE only. She was able to respond to verbal/tactile cues today for bed mobility techniques.Patient will continue to benefit from skilled physical therapy to improve generalized strength, ROM, and capacity for functional activity.    Personal Factors and Comorbidities Age;Time since onset of injury/illness/exacerbation;Comorbidity 3+;Fitness    Comorbidities HTN, quadriparesis, history of DVT, neurogenic bladder    Examination-Activity Limitations Bathing;Hygiene/Grooming;Squat;Bed Mobility;Lift;Bend;Stand;Engineer, manufacturing;Toileting;Self Feeding;Transfers;Continence;Sit;Dressing;Sleep;Carry    Examination-Participation Restrictions Church;Laundry;Cleaning;Medication Management;Community Activity;Meal Prep;Interpersonal Relationship    Stability/Clinical Decision Making Evolving/Moderate complexity    Rehab Potential Fair    PT Frequency 2x / week    PT Duration 12 weeks    PT Treatment/Interventions ADLs/Self Care Home Management;Electrical Stimulation;Therapeutic activities;Wheelchair mobility  training;Vasopneumatic Device;Joint Manipulations;Vestibular;Passive range of motion;Patient/family education;Therapeutic exercise;DME Instruction;Biofeedback;Aquatic Therapy;Moist Heat;Gait training;Balance training;Orthotic Fit/Training;Dry needling;Energy conservation;Taping;Splinting;Neuromuscular re-education;Cryotherapy;Ultrasound;Functional mobility training    PT Next Visit Plan sitting balance/endurance and continue with progressive Strengthening.    Consulted and Agree with Plan of Care Patient           Patient will benefit from skilled therapeutic intervention in order to improve the following deficits and impairments:  Abnormal gait,Decreased balance,Decreased endurance,Decreased mobility,Difficulty walking,Hypomobility,Impaired sensation,Decreased range of motion,Improper body mechanics,Impaired perceived functional ability,Decreased activity tolerance,Decreased knowledge of use of DME,Decreased safety awareness,Decreased strength,Impaired flexibility,Impaired UE functional use,Postural dysfunction  Visit Diagnosis: Muscle weakness (generalized)  Other lack of coordination  Other abnormalities of gait and mobility  Abdominal distention  Central cord syndrome, sequela (HCC)  DVT (deep vein thrombosis) in pregnancy     Problem List Patient Active Problem List   Diagnosis Date Noted  . Acute appendicitis with appendiceal abscess 05/30/2018  . Hypocalcemia 02/15/2018  . Dysuria 02/15/2018  . Bilateral lower extremity edema 02/11/2018  . Vaginal yeast infection 01/30/2018  . Chest tightness 01/27/2018  . Healthcare-associated pneumonia 01/25/2018  . Pleural effusion, not elsewhere classified 01/25/2018  . Acute deep vein thrombosis (DVT) of left lower extremity ([place]) 01/24/2018  . Acute deep vein thrombosis (DVT) of right lower extremity ([place]) 01/24/2018  . Chronic allergic rhinitis 01/24/2018  . Depression with anxiety 01/24/2018  . UTI due to Klebsiella species  01/24/2018  . Protein-calorie malnutrition, severe ([place]) 01/24/2018  . S/P insertion of IVC (inferior vena caval) filter 01/24/2018  . Reactive depression   . Hypokalemia   . Leukocytosis   . Essential hypertension   . Trauma   . 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([place])   . Neuropathic pain   . Neurogenic bowel   . Neurogenic bladder   . Benign essential HTN   . Acute blood loss anemia   . Central cord syndrome at C6 level of cervical spinal cord ([place]) 11/29/2017  . Central cord syndrome [facility]) 11/29/2017   Sande Brothers, PT 02/27/2020, 3:10 PM  [place] MAIN [facility] SERVICES [address]  [place], [place], [address] Phone: [phone]   Fax:  [phone]  Name: Sherry [name] MRN: [id number] Date of Birth: 1934/11/24

## 2020-02-27 NOTE — Therapy (Signed)
[place] MAIN [facility] SERVICES [address] [place], [place], [address] Phone: [phone]   Fax:  [phone]  Occupational Therapy Treatment/Recertification Occupational Therapy Progress Note  Dates of reporting period   01/14/2020 to   02/27/2020  Patient Details  Name: Sherry [name] MRN: [id number] Date of Birth: 07-26-1934 No data recorded  Encounter Date: 02/27/2020   OT End of Session - 02/27/20 1356    Visit Number 30    Number of Visits 87    Date for OT Re-Evaluation 05/21/20    Authorization Time Period Progress report period starting 01/14/2020    OT Start Time 1300    OT Stop Time 1345    OT Time Calculation (min) 45 min    Activity Tolerance Patient tolerated treatment well    Behavior During Therapy [facility] for tasks assessed/performed           Past Medical History:  Diagnosis Date  . Acute blood loss anemia   . Arthritis   . Cancer ([place])    skin  . Central cord syndrome at C6 level of cervical spinal cord ([place]) 11/29/2017  . Hypertension   . Protein-calorie malnutrition, severe ([place]) 01/24/2018  . S/P insertion of IVC (inferior vena caval) filter 01/24/2018  . 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[facility])     Past Surgical History:  Procedure Laterality Date  . ANTERIOR CERVICAL DECOMP/DISCECTOMY FUSION N/A 11/29/2017   Procedure: Cervical five-six, six-seven Anterior Cervical Decompression Fusion;  Surgeon: Judith Part, MD;  Location: [place];  Service: Neurosurgery;  Laterality: N/A;  Cervical five-six, six-seven Anterior Cervical Decompression Fusion  . CATARACT EXTRACTION    . IR IVC FILTER PLMT / S&I /IMG GUID/MOD SED  12/08/2017    There were no vitals filed for this visit.   Subjective Assessment - 02/27/20 1356    Subjective  Pt. reports feeling okay today.    Patient is accompanied by: Family member    Pertinent History Patient s/p fall November 29, 2017 resulting in diagnosis of central cord syndrome at C 6 level.  She  has had therapy in multiple venues but with recent move to [facility], therapy staff recommended she seek outpatient therapy for her needs.    Patient Stated Goals Patient would like to be able to move better in bed, stand and perform self care tasks.    Currently in Pain? No/denies          OT Treatment  Pt.tolerated bilateral shoulder flexion, extension, abduction, elbow flexion, extension forearm supination AAROM/AROM. PROM bilateral wrist extension, digit MP, PIP, and DIP flexion, and extension, thumb radial, and palmar abduction in conjunction with moist heat.  Pt.continues to make steadyprogress.Pt. has improved with BUE ROM and continues to work on improving overall functional reaching. Pt. is now able to use a utensil to feed herself with increased time. Pt. is using a wide width pen to fill out her menu on a daily basis, and complete her crossword puzzles. Pt.has improved with MP, PIP, and DIP flexion, and working towards gross gripping. Pt.continues to beable to use her right thumb to hold items. Pt. tolerated ROM well without reports of pain, or discomfort. Pt.continues to benefit from working on improving ROM, and bilateral UE functioning in order to work towards increasing engagement of her bilateral hands during ADLs, and IADL tasks.      [facility] OT Assessment - 02/27/20 1433      Hand Function   Comment Digit flexion to the [facility]. R: 2nd 5(4.5  cm), 3rd: 7(5cm, 4th: 7(5cm), 5th: 6(6cm), L: 2nd: 8(8cm), 3rd: 8(7.5cm), 4th: 8(7.5cm), 5th: 8(7.5cm)                            OT Education - 02/27/20 1356    Education Details UE ROM    Person(s) Educated Patient;Caregiver(s)    Methods Explanation;Demonstration    Comprehension Verbalized understanding;Need further instruction               OT Long Term Goals - 02/27/20 1357      OT LONG TERM GOAL #1   Title Patient and caregiver will demonstrate understanding of home exercise program for ROM.     Baseline Pt. has a restorative aide rehab aide assist her wih exercises at [facility]    Time 12    Period Weeks    Status On-going    Target Date 05/21/20      OT LONG TERM GOAL #2   Title Patient will demonstrate ability to don shirt with min assist from seated position.    Baseline Pt. is indepedent with a larger shirt, and requires increased time to complete    Time 12    Period Weeks    Status On-going    Target Date 05/21/20      OT LONG TERM GOAL #4   Title Patient will demonstrate improved composite finger flexion to hold deodorant to apply to underarms.    Baseline Pt. continues to present with improving finger flexion of R hand for a partial gross grasp, but unable to grasp and hold items. Very limited digit flexion of L hand. Pt. has active left thumb abduction.    Time 12    Period Weeks    Status On-going    Target Date 05/21/20      OT LONG TERM GOAL #6   Title Patient will increase right UE ROM to comb the back of her hair with modified independence.    Baseline Pt. continues to be able to comb the front, and sides of her head. Pt. can now reach the back, however cannot see what she is doing in the back of her head.    Time 12    Period Weeks    Status Partially Met    Target Date 05/21/20      OT LONG TERM GOAL #8   Title Pt. will write, and sign her name with 100% legibility, and modified independence.    Baseline Pt. is consistently filling out her daily menu, and writing for crossword puzzles. Pt. is able to maintain grasp on a wide width pen. [place] to work on Media planner.    Time 12    Period Weeks    Status On-going    Target Date 05/21/20                 Plan - 02/27/20 1357    Clinical Impression Statement Pt.continues to make steadyprogress.Pt. has improved with BUE ROM and continues to work on improving overall functional reaching. Pt. is now able to use a utensil to feed herself with increased time. Pt. is using a wide width  pen to fill out her menu on a daily basis, and complete her crossword puzzles. Pt.has improved with MP, PIP, and DIP flexion, and working towards gross gripping. Pt.continues to beable to use her right thumb to hold items. Pt. tolerated ROM well without reports of pain, or discomfort. Pt.continues to benefit from working  on improving ROM, and bilateral UE functioning in order to work towards increasing engagement of her bilateral hands during ADLs, and IADL tasks.    OT Occupational Profile and History Detailed Assessment- Review of Records and additional review of physical, cognitive, psychosocial history related to current functional performance    Occupational performance deficits (Please refer to evaluation for details): ADL's;IADL's;Leisure;Social Participation    Body Structure / Function / Physical Skills ADL;Continence;Dexterity;Flexibility;Strength;ROM;Balance;Coordination;FMC;IADL;Endurance;UE functional use;Decreased knowledge of use of DME;GMC    Psychosocial Skills Environmental  Adaptations;Habits;Routines and Behaviors    Rehab Potential Fair    Clinical Decision Making Limited treatment options, no task modification necessary    Comorbidities Affecting Occupational Performance: Presence of comorbidities impacting occupational performance    Comorbidities impacting occupational performance description: contractures of bilateral hands, dependent transfers,    Modification or Assistance to Complete Evaluation  No modification of tasks or assist necessary to complete eval    OT Frequency 2x / week    OT Duration 12 weeks    OT Treatment/Interventions Self-care/ADL training;Cryotherapy;Therapeutic exercise;DME and/or AE instruction;Balance training;Neuromuscular education;Manual Therapy;Splinting;Moist Heat;Passive range of motion;Therapeutic activities;Patient/family education    Plan continue to progress ROM of digits, wrists and shoulders as it pertains to completion of ADL tasks that  pt values.    Consulted and Agree with Plan of Care Patient           Patient will benefit from skilled therapeutic intervention in order to improve the following deficits and impairments:   Body Structure / Function / Physical Skills: ADL,Continence,Dexterity,Flexibility,Strength,ROM,Balance,Coordination,FMC,IADL,Endurance,UE functional use,Decreased knowledge of use of [facility]   Psychosocial Skills: Environmental  Adaptations,Habits,Routines and Behaviors   Visit Diagnosis: Muscle weakness (generalized)  Other lack of coordination    Problem List Patient Active Problem List   Diagnosis Date Noted  . Acute appendicitis with appendiceal abscess 05/30/2018  . Hypocalcemia 02/15/2018  . Dysuria 02/15/2018  . Bilateral lower extremity edema 02/11/2018  . Vaginal yeast infection 01/30/2018  . Chest tightness 01/27/2018  . Healthcare-associated pneumonia 01/25/2018  . Pleural effusion, not elsewhere classified 01/25/2018  . Acute deep vein thrombosis (DVT) of left lower extremity ([place]) 01/24/2018  . Acute deep vein thrombosis (DVT) of right lower extremity ([place]) 01/24/2018  . Chronic allergic rhinitis 01/24/2018  . Depression with anxiety 01/24/2018  . UTI due to Klebsiella species 01/24/2018  . Protein-calorie malnutrition, severe ([place]) 01/24/2018  . S/P insertion of IVC (inferior vena caval) filter 01/24/2018  . Reactive depression   . Hypokalemia   . Leukocytosis   . Essential hypertension   . Trauma   . 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([place])   . Neuropathic pain   . Neurogenic bowel   . Neurogenic bladder   . Benign essential HTN   . Acute blood loss anemia   . Central cord syndrome at C6 level of cervical spinal cord ([place]) 11/29/2017  . Central cord syndrome [facility]) 11/29/2017    Harrel Carina, MS,  OTR/L 02/27/2020, 4:26 PM  [place] MAIN [facility] SERVICES [address] [place], [place], [address] Phone: [phone]   Fax:   [phone]  Name: Sherry [name] MRN: [id number] Date of Birth: May 26, 1934

## 2020-03-03 ENCOUNTER — Other Ambulatory Visit: Payer: Self-pay

## 2020-03-03 ENCOUNTER — Ambulatory Visit: Payer: Medicare HMO | Admitting: Occupational Therapy

## 2020-03-03 ENCOUNTER — Ambulatory Visit: Payer: Medicare HMO | Admitting: Physical Therapy

## 2020-03-03 ENCOUNTER — Encounter: Payer: Self-pay | Admitting: Occupational Therapy

## 2020-03-03 DIAGNOSIS — M6281 Muscle weakness (generalized): Secondary | ICD-10-CM

## 2020-03-03 DIAGNOSIS — R278 Other lack of coordination: Secondary | ICD-10-CM

## 2020-03-03 DIAGNOSIS — R2689 Other abnormalities of gait and mobility: Secondary | ICD-10-CM

## 2020-03-03 NOTE — Therapy (Signed)
[place] MAIN [facility] SERVICES [address] [place], [place], [address] Phone: [phone]   Fax:  [phone]  Occupational Therapy Treatment  Patient Details  Name: Sherry [name] MRN: [id number] Date of Birth: 1934-11-02 No data recorded  Encounter Date: 03/03/2020   OT End of Session - 03/03/20 1507    Visit Number 31    Number of Visits 86    Date for OT Re-Evaluation 05/21/20    Authorization Time Period Progress report period starting 01/14/2020    OT Start Time 1105    OT Stop Time 1145    OT Time Calculation (min) 40 min    Equipment Utilized During Treatment moist heat    Activity Tolerance Patient tolerated treatment well    Behavior During Therapy [facility] for tasks assessed/performed           Past Medical History:  Diagnosis Date  . Acute blood loss anemia   . Arthritis   . Cancer ([place])    skin  . Central cord syndrome at C6 level of cervical spinal cord ([place]) 11/29/2017  . Hypertension   . Protein-calorie malnutrition, severe ([place]) 01/24/2018  . S/P insertion of IVC (inferior vena caval) filter 01/24/2018  . 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[facility])     Past Surgical History:  Procedure Laterality Date  . ANTERIOR CERVICAL DECOMP/DISCECTOMY FUSION N/A 11/29/2017   Procedure: Cervical five-six, six-seven Anterior Cervical Decompression Fusion;  Surgeon: Judith Part, MD;  Location: [place];  Service: Neurosurgery;  Laterality: N/A;  Cervical five-six, six-seven Anterior Cervical Decompression Fusion  . CATARACT EXTRACTION    . IR IVC FILTER PLMT / S&I /IMG GUID/MOD SED  12/08/2017    There were no vitals filed for this visit.   Subjective Assessment - 03/03/20 1507    Subjective  Pt. reports feeling okay today.    Patient is accompanied by: Family member    Pertinent History Patient s/p fall November 29, 2017 resulting in diagnosis of central cord syndrome at C 6 level.  She has had therapy in multiple venues but with recent move to  [facility], therapy staff recommended she seek outpatient therapy for her needs.    Currently in Pain? No/denies          OT Treatment  Pt.tolerated bilateral shoulder flexion, extension, abduction, elbow flexion, extension forearm supination AAROM/AROM. PROM bilateral wrist extension, digit MP, PIP, and DIP flexion, and extension, thumb radial, and palmar abduction in conjunction with moist heat. Pt. worked on functional reaching for large flat shapes using the shape tower. Pt. Worked on moving the shapes through 2 vertical rungs.   Pt.continues to make steadyprogress.Pt. Continues to improve with BUE ROM and continues to work on improving overall functional reaching. Pt. is now able to use a utensil to feed herself with increased time. Pt. Continues to use a wide width pen to fill out her menu on a daily basis, and complete her crossword puzzles. Pt.has improved with MP, PIP, and DIP flexion, and working towards gross gripping. Pt.continues to beable to use her right thumb to hold items. Pt. tolerated ROM well without reports of pain, or discomfort. Pt.continues to benefit from working on improving ROM, and bilateral UE functioning in order to work towards increasing engagement of her bilateral hands during ADLs, and IADL tasks.                       OT Education - 03/03/20 1507    Education Details UE  ROM    Person(s) Educated Patient;Caregiver(s)    Methods Explanation;Demonstration    Comprehension Verbalized understanding;Need further instruction               OT Long Term Goals - 02/27/20 1357      OT LONG TERM GOAL #1   Title Patient and caregiver will demonstrate understanding of home exercise program for ROM.    Baseline Pt. has a restorative aide rehab aide assist her wih exercises at [facility]    Time 12    Period Weeks    Status On-going    Target Date 05/21/20      OT LONG TERM GOAL #2   Title Patient will demonstrate ability to don  shirt with min assist from seated position.    Baseline Pt. is indepedent with a larger shirt, and requires increased time to complete    Time 12    Period Weeks    Status On-going    Target Date 05/21/20      OT LONG TERM GOAL #4   Title Patient will demonstrate improved composite finger flexion to hold deodorant to apply to underarms.    Baseline Pt. continues to present with improving finger flexion of R hand for a partial gross grasp, but unable to grasp and hold items. Very limited digit flexion of L hand. Pt. has active left thumb abduction.    Time 12    Period Weeks    Status On-going    Target Date 05/21/20      OT LONG TERM GOAL #6   Title Patient will increase right UE ROM to comb the back of her hair with modified independence.    Baseline Pt. continues to be able to comb the front, and sides of her head. Pt. can now reach the back, however cannot see what she is doing in the back of her head.    Time 12    Period Weeks    Status Partially Met    Target Date 05/21/20      OT LONG TERM GOAL #8   Title Pt. will write, and sign her name with 100% legibility, and modified independence.    Baseline Pt. is consistently filling out her daily menu, and writing for crossword puzzles. Pt. is able to maintain grasp on a wide width pen. [place] to work on Media planner.    Time 12    Period Weeks    Status On-going    Target Date 05/21/20                 Plan - 03/03/20 1510    Clinical Impression Statement Pt.continues to make steadyprogress.Pt. Continues to improve with BUE ROM and continues to work on improving overall functional reaching. Pt. is now able to use a utensil to feed herself with increased time. Pt. Continues to use a wide width pen to fill out her menu on a daily basis, and complete her crossword puzzles. Pt.has improved with MP, PIP, and DIP flexion, and working towards gross gripping. Pt.continues to beable to use her right thumb to hold items.  Pt. tolerated ROM well without reports of pain, or discomfort. Pt.continues to benefit from working on improving ROM, and bilateral UE functioning in order to work towards increasing engagement of her bilateral hands during ADLs, and IADL tasks.    OT Occupational Profile and History Detailed Assessment- Review of Records and additional review of physical, cognitive, psychosocial history related to current functional performance  Occupational performance deficits (Please refer to evaluation for details): ADL's;IADL's;Leisure;Social Participation    Body Structure / Function / Physical Skills ADL;Continence;Dexterity;Flexibility;Strength;ROM;Balance;Coordination;FMC;IADL;Endurance;UE functional use;Decreased knowledge of use of DME;GMC    Psychosocial Skills Environmental  Adaptations;Habits;Routines and Behaviors    Rehab Potential Fair    Clinical Decision Making Limited treatment options, no task modification necessary    Comorbidities Affecting Occupational Performance: Presence of comorbidities impacting occupational performance    Modification or Assistance to Complete Evaluation  No modification of tasks or assist necessary to complete eval    OT Frequency 2x / week    OT Duration 12 weeks    OT Treatment/Interventions Self-care/ADL training;Cryotherapy;Therapeutic exercise;DME and/or AE instruction;Balance training;Neuromuscular education;Manual Therapy;Splinting;Moist Heat;Passive range of motion;Therapeutic activities;Patient/family education    Consulted and Agree with Plan of Care Patient           Patient will benefit from skilled therapeutic intervention in order to improve the following deficits and impairments:   Body Structure / Function / Physical Skills: ADL,Continence,Dexterity,Flexibility,Strength,ROM,Balance,Coordination,FMC,IADL,Endurance,UE functional use,Decreased knowledge of use of [facility]   Psychosocial Skills: Environmental  Adaptations,Habits,Routines and  Behaviors   Visit Diagnosis: Muscle weakness (generalized)  Other lack of coordination    Problem List Patient Active Problem List   Diagnosis Date Noted  . Acute appendicitis with appendiceal abscess 05/30/2018  . Hypocalcemia 02/15/2018  . Dysuria 02/15/2018  . Bilateral lower extremity edema 02/11/2018  . Vaginal yeast infection 01/30/2018  . Chest tightness 01/27/2018  . Healthcare-associated pneumonia 01/25/2018  . Pleural effusion, not elsewhere classified 01/25/2018  . Acute deep vein thrombosis (DVT) of left lower extremity ([place]) 01/24/2018  . Acute deep vein thrombosis (DVT) of right lower extremity ([place]) 01/24/2018  . Chronic allergic rhinitis 01/24/2018  . Depression with anxiety 01/24/2018  . UTI due to Klebsiella species 01/24/2018  . Protein-calorie malnutrition, severe ([place]) 01/24/2018  . S/P insertion of IVC (inferior vena caval) filter 01/24/2018  . Reactive depression   . Hypokalemia   . Leukocytosis   . Essential hypertension   . Trauma   . 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([place])   . Neuropathic pain   . Neurogenic bowel   . Neurogenic bladder   . Benign essential HTN   . Acute blood loss anemia   . Central cord syndrome at C6 level of cervical spinal cord ([place]) 11/29/2017  . Central cord syndrome [facility]) 11/29/2017    Harrel Carina, MS, OTR/L 03/03/2020, 3:48 PM  [place] MAIN [facility] SERVICES [address] [place], [place], [address] Phone: [phone]   Fax:  [phone]  Name: Sherry [name] MRN: [id number] Date of Birth: April 02, 1934

## 2020-03-03 NOTE — Therapy (Signed)
[place] MAIN [facility] SERVICES [address] [place], [place], [address] Phone: [phone]   Fax:  [phone]  Physical Therapy Treatment  Patient Details  Name: Sherry [name] MRN: [id number] Date of Birth: [birth year] Referring Provider (PT): Viviana Simpler   Encounter Date: 03/03/2020   PT End of Session - 03/03/20 1117    Visit Number 38    Number of Visits 54    Date for PT Re-Evaluation 04/07/20    Authorization Type aetna medicare FOTO performed by PT on eval (7/20), score 12    PT Start Time 1105    PT Stop Time 1145    PT Time Calculation (min) 40 min    Equipment Utilized During Treatment Other (comment);Gait belt   lift   Activity Tolerance Patient tolerated treatment well;No increased pain;Patient limited by fatigue    Behavior During Therapy [facility] for tasks assessed/performed           Past Medical History:  Diagnosis Date  . Acute blood loss anemia   . Arthritis   . Cancer ([place])    skin  . Central cord syndrome at C6 level of cervical spinal cord ([place]) 11/29/2017  . Hypertension   . Protein-calorie malnutrition, severe ([place]) 01/24/2018  . S/P insertion of IVC (inferior vena caval) filter 01/24/2018  . 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[facility])     Past Surgical History:  Procedure Laterality Date  . ANTERIOR CERVICAL DECOMP/DISCECTOMY FUSION N/A 11/29/2017   Procedure: Cervical five-six, six-seven Anterior Cervical Decompression Fusion;  Surgeon: Judith Part, MD;  Location: [place];  Service: Neurosurgery;  Laterality: N/A;  Cervical five-six, six-seven Anterior Cervical Decompression Fusion  . CATARACT EXTRACTION    . IR IVC FILTER PLMT / S&I /IMG GUID/MOD SED  12/08/2017    There were no vitals filed for this visit.   Subjective Assessment - 03/03/20 1116    Subjective Patient denies of any new symptoms or falls since last therapy session.    Patient is accompained by: --   [name] (aide)   Pertinent History Pt is an 86 yo  female that fell in 2019, fractured vertebrae in her neck and in her low back per family. 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 After hospital stay, pt discharged to [facility] for ~1 month, experienced severe UTI as well as bilateral DVT (IVC filter placed). Discharged to [facility], stayed for about a year, and then moved to [facility] in April 2021. Was receiving PT, but per family facility reported that did not have adequate equipment to maximize PT for her. Pt until about 1 month ago was practicing sit to stand transfers with 1-2 people, and working on static standing. Has a brace for L foot due to PF. Pt currently needs assistance with all ADLs (able to assist with donning/doffing her shirt), bed baths, and needs a hoyer lift for transfers. Able to drive power wheelchair without assistance.    Limitations Standing;Walking;House hold activities;Lifting    How long can you sit comfortably? NA    How long can you stand comfortably? unable    How long can you walk comfortably? unable    Patient Stated Goals "to stand up and walk"    Currently in Pain? No/denies           Therex: Ankle pumps on right x 15 reps Quad sets x 3 sec hold bilaterally x 10 reps Assisted heel slides x 12 reps each leg Assisted hip abd x 12 reps each leg  Assisted bridging X 12 reps (PT holding ankles/feet to stabilize)   Neuro-re education:  Static sitting at edge of mat table x 3 min followed by some dynamic seated activities (above mentioned therex) and the following: Bilateral UE shoulder flex focusing on postural control x 10 reps. Dynamic UE Reaching x 3 min   Seated: LAQ x terapist provided minA-modA for RLE and indpendent with LLE x 2 10 reps with verbal cues for eccentric control for improved muscle control.  Clamshells with YTB x 15 reps; emphasis on holds at end of motion           PT Short Term Goals - 01/28/20 1453      PT SHORT TERM GOAL #1   Title The patient will  perform initial HEP with minimum assistance in order to improve strength and function.    Time 4    Period Weeks    Status On-going    Target Date 02/11/20             PT Long Term Goals - 01/28/20 1454      PT LONG TERM GOAL #1   Title The patient will be compliant with finalized HEP with minimum assistance in preparation for self management and maintenance of condition.    Time 12    Period Weeks    Status On-going    Target Date 04/07/20      PT LONG TERM GOAL #2   Title The patient will demonstrate at least 10 point improvement on FOTO score indicating an improved ability to perform functional activities.    Baseline on eval (/720) score 12; 10/22/19: 12; 12/10/19: 18, 12/13: 18    Time 12    Period Weeks    Status On-going    Target Date 04/07/20      PT LONG TERM GOAL #3   Title The patient will demonstrate lateral scooting  for assistance with transfers with minA to maximize independence.    Baseline 10/22/19: Pt requires modA+1 for lateral scooting; 12/10/19: modA+1, 12/13: modA-maxA, 12/27: modA-maxA    Time 12    Period Weeks    Status On-going    Target Date 04/07/20      PT LONG TERM GOAL #4   Title The patient will demonstrate a squat pivot transfer with minimum assistance to maximize independence and mobility.    Baseline 10/22/19: unable to safely attempt at this time; 12/10/19: unable to safely attempt at this time, 01/14/20: unable to safety attempt at this time    Time 12    Period Weeks    Status On-going    Target Date 04/07/20      PT LONG TERM GOAL #5   Title The patient will demonstrate sitting without UE support for 2-5 minutes at EOB to improve participation and maximize independence with ADLs.    Baseline 10/22/19: Pt able ot sit unsupported for at least 2 minutes at edge of bed    Time 12    Period Weeks    Status Achieved                 Plan - 03/03/20 1226    Clinical Impression Statement Patient completed strengthening exercises  with fair tolerance to activity. She demonstrates difficulty with supine core stabilization exercises due to significant weakness. Patient completed transfer of supine to EOB with maxA to maneuever BLE; she is able to complete pushing up from mat with RUE with minA. She continues to demonstratres fair+ balance in unsupported  sitting with ability to self correct with dynamic exercises. Patient would benefit from continued PT services to increase mobility and strength to improve patient's quality of life.    Personal Factors and Comorbidities Age;Time since onset of injury/illness/exacerbation;Comorbidity 3+;Fitness    Comorbidities HTN, quadriparesis, history of DVT, neurogenic bladder    Examination-Activity Limitations Bathing;Hygiene/Grooming;Squat;Bed Mobility;Lift;Bend;Stand;Engineer, manufacturing;Toileting;Self Feeding;Transfers;Continence;Sit;Dressing;Sleep;Carry    Examination-Participation Restrictions Church;Laundry;Cleaning;Medication Management;Community Activity;Meal Prep;Interpersonal Relationship    Stability/Clinical Decision Making Evolving/Moderate complexity    Rehab Potential Fair    PT Frequency 2x / week    PT Duration 12 weeks    PT Treatment/Interventions ADLs/Self Care Home Management;Electrical Stimulation;Therapeutic activities;Wheelchair mobility training;Vasopneumatic Device;Joint Manipulations;Vestibular;Passive range of motion;Patient/family education;Therapeutic exercise;DME Instruction;Biofeedback;Aquatic Therapy;Moist Heat;Gait training;Balance training;Orthotic Fit/Training;Dry needling;Energy conservation;Taping;Splinting;Neuromuscular re-education;Cryotherapy;Ultrasound;Functional mobility training    PT Next Visit Plan sitting balance/endurance and continue with progressive Strengthening.    Consulted and Agree with Plan of Care Patient           Patient will benefit from skilled therapeutic intervention in order to improve the following deficits and  impairments:  Abnormal gait,Decreased balance,Decreased endurance,Decreased mobility,Difficulty walking,Hypomobility,Impaired sensation,Decreased range of motion,Improper body mechanics,Impaired perceived functional ability,Decreased activity tolerance,Decreased knowledge of use of DME,Decreased safety awareness,Decreased strength,Impaired flexibility,Impaired UE functional use,Postural dysfunction  Visit Diagnosis: Muscle weakness (generalized)  Other lack of coordination  Other abnormalities of gait and mobility     Problem List Patient Active Problem List   Diagnosis Date Noted  . Acute appendicitis with appendiceal abscess 05/30/2018  . Hypocalcemia 02/15/2018  . Dysuria 02/15/2018  . Bilateral lower extremity edema 02/11/2018  . Vaginal yeast infection 01/30/2018  . Chest tightness 01/27/2018  . Healthcare-associated pneumonia 01/25/2018  . Pleural effusion, not elsewhere classified 01/25/2018  . Acute deep vein thrombosis (DVT) of left lower extremity ([place]) 01/24/2018  . Acute deep vein thrombosis (DVT) of right lower extremity ([place]) 01/24/2018  . Chronic allergic rhinitis 01/24/2018  . Depression with anxiety 01/24/2018  . UTI due to Klebsiella species 01/24/2018  . Protein-calorie malnutrition, severe ([place]) 01/24/2018  . S/P insertion of IVC (inferior vena caval) filter 01/24/2018  . Reactive depression   . Hypokalemia   . Leukocytosis   . Essential hypertension   . Trauma   . 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([place])   . Neuropathic pain   . Neurogenic bowel   . Neurogenic bladder   . Benign essential HTN   . Acute blood loss anemia   . Central cord syndrome at C6 level of cervical spinal cord ([place]) 11/29/2017  . Central cord syndrome [facility]) 11/29/2017   Karl Luke PT, DPT Netta Corrigan 03/03/2020, 12:31 PM  [place] MAIN [facility] SERVICES [address] [place], [place], [address] Phone: [phone]   Fax:  [phone]  Name: Sherry [name] MRN: [id number] Date of Birth: 1934/03/25

## 2020-03-05 ENCOUNTER — Other Ambulatory Visit: Payer: Self-pay

## 2020-03-05 ENCOUNTER — Ambulatory Visit: Payer: Medicare HMO | Admitting: Physical Therapy

## 2020-03-05 ENCOUNTER — Ambulatory Visit: Payer: Medicare HMO | Attending: Internal Medicine | Admitting: Occupational Therapy

## 2020-03-05 ENCOUNTER — Encounter: Payer: Self-pay | Admitting: Occupational Therapy

## 2020-03-05 DIAGNOSIS — R278 Other lack of coordination: Secondary | ICD-10-CM

## 2020-03-05 DIAGNOSIS — S14129S Central cord syndrome at unspecified level of cervical spinal cord, sequela: Secondary | ICD-10-CM | POA: Insufficient documentation

## 2020-03-05 DIAGNOSIS — R2689 Other abnormalities of gait and mobility: Secondary | ICD-10-CM | POA: Diagnosis present

## 2020-03-05 DIAGNOSIS — M6281 Muscle weakness (generalized): Secondary | ICD-10-CM | POA: Insufficient documentation

## 2020-03-05 NOTE — Therapy (Signed)
[place] MAIN [facility] SERVICES [address] [place], [place], [address] Phone: [phone]   Fax:  [phone]  Physical Therapy Treatment  Patient Details  Name: Sherry [name] MRN: [id number] Date of Birth: 01-Mar-1934 Referring Provider (PT): Viviana Simpler   Encounter Date: 03/05/2020   PT End of Session - 03/05/20 1400    Visit Number 39    Number of Visits 54    Date for PT Re-Evaluation 04/07/20    Authorization Type aetna medicare FOTO performed by PT on eval (7/20), score 12    PT Start Time 1345    PT Stop Time 1430    PT Time Calculation (min) 45 min    Equipment Utilized During Treatment Other (comment);Gait belt   lift   Activity Tolerance Patient tolerated treatment well;No increased pain;Patient limited by fatigue    Behavior During Therapy [facility] for tasks assessed/performed           Past Medical History:  Diagnosis Date  . Acute blood loss anemia   . Arthritis   . Cancer ([place])    skin  . Central cord syndrome at C6 level of cervical spinal cord ([place]) 11/29/2017  . Hypertension   . Protein-calorie malnutrition, severe ([place]) 01/24/2018  . S/P insertion of IVC (inferior vena caval) filter 01/24/2018  . 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[facility])     Past Surgical History:  Procedure Laterality Date  . ANTERIOR CERVICAL DECOMP/DISCECTOMY FUSION N/A 11/29/2017   Procedure: Cervical five-six, six-seven Anterior Cervical Decompression Fusion;  Surgeon: Judith Part, MD;  Location: [place];  Service: Neurosurgery;  Laterality: N/A;  Cervical five-six, six-seven Anterior Cervical Decompression Fusion  . CATARACT EXTRACTION    . IR IVC FILTER PLMT / S&I /IMG GUID/MOD SED  12/08/2017    There were no vitals filed for this visit.   Subjective Assessment - 03/05/20 1548    Subjective No new symptoms or injuries reported since last therapy session.    Patient is accompained by: --   [name] (aide)   Pertinent History Pt is an 86 yo female  that fell in 2019, fractured vertebrae in her neck and in her low back per family. 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 After hospital stay, pt discharged to CIR for ~1 month, experienced severe UTI as well as bilateral DVT (IVC filter placed). Discharged to [facility], stayed for about a year, and then moved to [facility] in April 2021. Was receiving PT, but per family facility reported that did not have adequate equipment to maximize PT for her. Pt until about 1 month ago was practicing sit to stand transfers with 1-2 people, and working on static standing. Has a brace for L foot due to PF. Pt currently needs assistance with all ADLs (able to assist with donning/doffing her shirt), bed baths, and needs a hoyer lift for transfers. Able to drive power wheelchair without assistance.    Limitations Standing;Walking;House hold activities;Lifting    How long can you sit comfortably? NA    How long can you stand comfortably? unable    How long can you walk comfortably? unable    Patient Stated Goals "to stand up and walk"    Currently in Pain? No/denies          Hoyer lift to transfer from [employer]  There Ex: Seated LAQ x 15 reps  Seated hip abduction x YTB x 15 reps  Seated core twists with yellow weighted ball x 15 reps Seated out/up with  yellow weighted ball x 15 reps Abdominal mini crunches x 2 10 reps   There Act:  Shifting weights laterally; more clearance going to the R side  Rocking forward/backward and scooting to the R side; minA to rock forward/backward x maxA of 2 to scoot towards R          PT Short Term Goals - 01/28/20 1453      PT SHORT TERM GOAL #1   Title The patient will perform initial HEP with minimum assistance in order to improve strength and function.    Time 4    Period Weeks    Status On-going    Target Date 02/11/20             PT Long Term Goals - 01/28/20 1454      PT LONG TERM GOAL #1   Title The patient will be  compliant with finalized HEP with minimum assistance in preparation for self management and maintenance of condition.    Time 12    Period Weeks    Status On-going    Target Date 04/07/20      PT LONG TERM GOAL #2   Title The patient will demonstrate at least 10 point improvement on FOTO score indicating an improved ability to perform functional activities.    Baseline on eval (/720) score 12; 10/22/19: 12; 12/10/19: 18, 12/13: 18    Time 12    Period Weeks    Status On-going    Target Date 04/07/20      PT LONG TERM GOAL #3   Title The patient will demonstrate lateral scooting  for assistance with transfers with minA to maximize independence.    Baseline 10/22/19: Pt requires modA+1 for lateral scooting; 12/10/19: modA+1, 12/13: modA-maxA, 12/27: modA-maxA    Time 12    Period Weeks    Status On-going    Target Date 04/07/20      PT LONG TERM GOAL #4   Title The patient will demonstrate a squat pivot transfer with minimum assistance to maximize independence and mobility.    Baseline 10/22/19: unable to safely attempt at this time; 12/10/19: unable to safely attempt at this time, 01/14/20: unable to safety attempt at this time    Time 12    Period Weeks    Status On-going    Target Date 04/07/20      PT LONG TERM GOAL #5   Title The patient will demonstrate sitting without UE support for 2-5 minutes at EOB to improve participation and maximize independence with ADLs.    Baseline 10/22/19: Pt able ot sit unsupported for at least 2 minutes at edge of bed    Time 12    Period Weeks    Status Achieved                 Plan - 03/05/20 1554    Clinical Impression Statement Practiced lateral scooting towards the left in today's session. Patient attempted rocking manuever going forward and backward direction with minA to clear part of gluts off mat and required maxA x 2 to scoot towards the left side. She completed weight shifting in seated position with more clearance of the gluts  going to the left. Patient would benefit from continued PT services to increase trunk stability and BLE strengthening to improve mobility and quality of life.    Personal Factors and Comorbidities Age;Time since onset of injury/illness/exacerbation;Comorbidity 3+;Fitness    Comorbidities HTN, quadriparesis, history of DVT, neurogenic bladder  Examination-Activity Limitations Bathing;Hygiene/Grooming;Squat;Bed Mobility;Lift;Bend;Stand;Engineer, manufacturing;Toileting;Self Feeding;Transfers;Continence;Sit;Dressing;Sleep;Carry    Examination-Participation Restrictions Church;Laundry;Cleaning;Medication Management;Community Activity;Meal Prep;Interpersonal Relationship    Stability/Clinical Decision Making Evolving/Moderate complexity    Rehab Potential Fair    PT Frequency 2x / week    PT Duration 12 weeks    PT Treatment/Interventions ADLs/Self Care Home Management;Electrical Stimulation;Therapeutic activities;Wheelchair mobility training;Vasopneumatic Device;Joint Manipulations;Vestibular;Passive range of motion;Patient/family education;Therapeutic exercise;DME Instruction;Biofeedback;Aquatic Therapy;Moist Heat;Gait training;Balance training;Orthotic Fit/Training;Dry needling;Energy conservation;Taping;Splinting;Neuromuscular re-education;Cryotherapy;Ultrasound;Functional mobility training    PT Next Visit Plan sitting balance/endurance and continue with progressive Strengthening.    Consulted and Agree with Plan of Care Patient           Patient will benefit from skilled therapeutic intervention in order to improve the following deficits and impairments:  Abnormal gait,Decreased balance,Decreased endurance,Decreased mobility,Difficulty walking,Hypomobility,Impaired sensation,Decreased range of motion,Improper body mechanics,Impaired perceived functional ability,Decreased activity tolerance,Decreased knowledge of use of DME,Decreased safety awareness,Decreased strength,Impaired  flexibility,Impaired UE functional use,Postural dysfunction  Visit Diagnosis: Muscle weakness (generalized)  Other abnormalities of gait and mobility  Other lack of coordination     Problem List Patient Active Problem List   Diagnosis Date Noted  . Acute appendicitis with appendiceal abscess 05/30/2018  . Hypocalcemia 02/15/2018  . Dysuria 02/15/2018  . Bilateral lower extremity edema 02/11/2018  . Vaginal yeast infection 01/30/2018  . Chest tightness 01/27/2018  . Healthcare-associated pneumonia 01/25/2018  . Pleural effusion, not elsewhere classified 01/25/2018  . Acute deep vein thrombosis (DVT) of left lower extremity ([place]) 01/24/2018  . Acute deep vein thrombosis (DVT) of right lower extremity ([place]) 01/24/2018  . Chronic allergic rhinitis 01/24/2018  . Depression with anxiety 01/24/2018  . UTI due to Klebsiella species 01/24/2018  . Protein-calorie malnutrition, severe ([place]) 01/24/2018  . S/P insertion of IVC (inferior vena caval) filter 01/24/2018  . Reactive depression   . Hypokalemia   . Leukocytosis   . Essential hypertension   . Trauma   . 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([place])   . Neuropathic pain   . Neurogenic bowel   . Neurogenic bladder   . Benign essential HTN   . Acute blood loss anemia   . Central cord syndrome at C6 level of cervical spinal cord ([place]) 11/29/2017  . Central cord syndrome [facility]) 11/29/2017   Karl Luke PT, DPT Netta Corrigan 03/05/2020, 4:06 PM  [place] MAIN [place] SERVICES [address] [place], [place], [address] Phone: [phone]   Fax:  [phone]  Name: Sherry [name] MRN: [id number] Date of Birth: 12-18-1934

## 2020-03-05 NOTE — Therapy (Signed)
[place] MAIN [facility] SERVICES [address] [place], [place], [address] Phone: [phone]   Fax:  [phone]  Occupational Therapy Treatment  Patient Details  Name: Sherry [name] MRN: [id number] Date of Birth: [birth year] No data recorded  Encounter Date: 03/05/2020   OT End of Session - 03/05/20 1445    Visit Number 32    Number of Visits 55    Date for OT Re-Evaluation 05/21/20    Authorization Time Period Progress report period starting 01/14/2020    OT Start Time 1300    OT Stop Time 1345    OT Time Calculation (min) 45 min    Activity Tolerance Patient tolerated treatment well    Behavior During Therapy [facility] for tasks assessed/performed           Past Medical History:  Diagnosis Date  . Acute blood loss anemia   . Arthritis   . Cancer ([place])    skin  . Central cord syndrome at C6 level of cervical spinal cord ([place]) 11/29/2017  . Hypertension   . Protein-calorie malnutrition, severe ([place]) 01/24/2018  . S/P insertion of IVC (inferior vena caval) filter 01/24/2018  . 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[facility])     Past Surgical History:  Procedure Laterality Date  . ANTERIOR CERVICAL DECOMP/DISCECTOMY FUSION N/A 11/29/2017   Procedure: Cervical five-six, six-seven Anterior Cervical Decompression Fusion;  Surgeon: Judith Part, MD;  Location: [place];  Service: Neurosurgery;  Laterality: N/A;  Cervical five-six, six-seven Anterior Cervical Decompression Fusion  . CATARACT EXTRACTION    . IR IVC FILTER PLMT / S&I /IMG GUID/MOD SED  12/08/2017    There were no vitals filed for this visit.   Subjective Assessment - 03/05/20 1445    Subjective  Pt. reports feeling okay today.    Patient is accompanied by: Family member    Pertinent History Patient s/p fall November 29, 2017 resulting in diagnosis of central cord syndrome at C 6 level.  She has had therapy in multiple venues but with recent move to [facility], therapy staff recommended she seek  outpatient therapy for her needs.    Currently in Pain? No/denies           OT Treatment  Pt.tolerated bilateral shoulder flexion, extension, abduction, elbow flexion, extension forearm supination AAROM/AROM. PROM bilateral wrist extension, digit MP, PIP, and DIP flexion, and extension, thumb radial, and palmar abductionin conjunction with moist heat. Pt. worked on functional reaching for large flat shapes using the shape tower. Pt. Worked on moving the shapes through 2 vertical rungs, as well as 3 squares through the 3rd vertical rung.  Pt.continues to make steadyprogress.Pt. Continues to improve with BUE ROM and continues to work on improving overall functional reaching. Pt. is now able to use a utensil to feed herself with increased time without the universal cuff/sleeve.. Pt. Continues to use a wide width pen to fill out her menu on a daily basis, and complete her crossword puzzles.Pt.has improved with MP, PIP, and DIP flexion, and working towards gross gripping.Pt.continues to beable to use her right thumb to hold items. Pt. tolerated ROM well without reports of pain, or discomfort. Pt.continues to benefit from working on improving ROM, and bilateral UE functioningin order to work towards increasing engagement of her bilateral hands during ADLs, and IADL tasks.                       OT Education - 03/05/20 1445    Education Details  UE ROM    Person(s) Educated Patient;Caregiver(s)    Methods Explanation;Demonstration    Comprehension Verbalized understanding;Need further instruction               OT Long Term Goals - 02/27/20 1357      OT LONG TERM GOAL #1   Title Patient and caregiver will demonstrate understanding of home exercise program for ROM.    Baseline Pt. has a restorative aide rehab aide assist her wih exercises at [facility]    Time 12    Period Weeks    Status On-going    Target Date 05/21/20      OT LONG TERM GOAL #2   Title  Patient will demonstrate ability to don shirt with min assist from seated position.    Baseline Pt. is indepedent with a larger shirt, and requires increased time to complete    Time 12    Period Weeks    Status On-going    Target Date 05/21/20      OT LONG TERM GOAL #4   Title Patient will demonstrate improved composite finger flexion to hold deodorant to apply to underarms.    Baseline Pt. continues to present with improving finger flexion of R hand for a partial gross grasp, but unable to grasp and hold items. Very limited digit flexion of L hand. Pt. has active left thumb abduction.    Time 12    Period Weeks    Status On-going    Target Date 05/21/20      OT LONG TERM GOAL #6   Title Patient will increase right UE ROM to comb the back of her hair with modified independence.    Baseline Pt. continues to be able to comb the front, and sides of her head. Pt. can now reach the back, however cannot see what she is doing in the back of her head.    Time 12    Period Weeks    Status Partially Met    Target Date 05/21/20      OT LONG TERM GOAL #8   Title Pt. will write, and sign her name with 100% legibility, and modified independence.    Baseline Pt. is consistently filling out her daily menu, and writing for crossword puzzles. Pt. is able to maintain grasp on a wide width pen. [place] to work on Media planner.    Time 12    Period Weeks    Status On-going    Target Date 05/21/20                 Plan - 03/05/20 1446    Clinical Impression Statement Pt.continues to make steadyprogress.Pt. Continues to improve with BUE ROM and continues to work on improving overall functional reaching. Pt. is now able to use a utensil to feed herself with increased time without the universal cuff/sleeve.. Pt. Continues to use a wide width pen to fill out her menu on a daily basis, and complete her crossword puzzles.Pt.has improved with MP, PIP, and DIP flexion, and working towards  gross gripping.Pt.continues to beable to use her right thumb to hold items. Pt. tolerated ROM well without reports of pain, or discomfort. Pt.continues to benefit from working on improving ROM, and bilateral UE functioningin order to work towards increasing engagement of her bilateral hands during ADLs, and IADL tasks.    OT Occupational Profile and History Detailed Assessment- Review of Records and additional review of physical, cognitive, psychosocial history related to current functional performance  Occupational performance deficits (Please refer to evaluation for details): ADL's;IADL's;Leisure;Social Participation    Body Structure / Function / Physical Skills ADL;Continence;Dexterity;Flexibility;Strength;ROM;Balance;Coordination;FMC;IADL;Endurance;UE functional use;Decreased knowledge of use of DME;GMC    Psychosocial Skills Environmental  Adaptations;Habits;Routines and Behaviors    Rehab Potential Fair    Clinical Decision Making Limited treatment options, no task modification necessary    Comorbidities Affecting Occupational Performance: Presence of comorbidities impacting occupational performance    Comorbidities impacting occupational performance description: contractures of bilateral hands, dependent transfers,    Modification or Assistance to Complete Evaluation  No modification of tasks or assist necessary to complete eval    OT Frequency 2x / week    OT Duration 12 weeks    OT Treatment/Interventions Self-care/ADL training;Cryotherapy;Therapeutic exercise;DME and/or AE instruction;Balance training;Neuromuscular education;Manual Therapy;Splinting;Moist Heat;Passive range of motion;Therapeutic activities;Patient/family education    Consulted and Agree with Plan of Care Patient           Patient will benefit from skilled therapeutic intervention in order to improve the following deficits and impairments:   Body Structure / Function / Physical Skills:  ADL,Continence,Dexterity,Flexibility,Strength,ROM,Balance,Coordination,FMC,IADL,Endurance,UE functional use,Decreased knowledge of use of [facility]   Psychosocial Skills: Environmental  Adaptations,Habits,Routines and Behaviors   Visit Diagnosis: Muscle weakness (generalized)    Problem List Patient Active Problem List   Diagnosis Date Noted  . Acute appendicitis with appendiceal abscess 05/30/2018  . Hypocalcemia 02/15/2018  . Dysuria 02/15/2018  . Bilateral lower extremity edema 02/11/2018  . Vaginal yeast infection 01/30/2018  . Chest tightness 01/27/2018  . Healthcare-associated pneumonia 01/25/2018  . Pleural effusion, not elsewhere classified 01/25/2018  . Acute deep vein thrombosis (DVT) of left lower extremity ([place]) 01/24/2018  . Acute deep vein thrombosis (DVT) of right lower extremity ([place]) 01/24/2018  . Chronic allergic rhinitis 01/24/2018  . Depression with anxiety 01/24/2018  . UTI due to Klebsiella species 01/24/2018  . Protein-calorie malnutrition, severe ([place]) 01/24/2018  . S/P insertion of IVC (inferior vena caval) filter 01/24/2018  . Reactive depression   . Hypokalemia   . Leukocytosis   . Essential hypertension   . Trauma   . 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([place])   . Neuropathic pain   . Neurogenic bowel   . Neurogenic bladder   . Benign essential HTN   . Acute blood loss anemia   . Central cord syndrome at C6 level of cervical spinal cord ([place]) 11/29/2017  . Central cord syndrome [facility]) 11/29/2017    Harrel Carina, MS, OTR/L 03/05/2020, 2:47 PM  [place] MAIN [facility] SERVICES [address] [place], [place], [address] Phone: [phone]   Fax:  [phone]  Name: Sherry [name] MRN: [id number] Date of Birth: 1934/10/11

## 2020-03-10 ENCOUNTER — Encounter: Payer: Self-pay | Admitting: Occupational Therapy

## 2020-03-10 ENCOUNTER — Other Ambulatory Visit: Payer: Self-pay

## 2020-03-10 ENCOUNTER — Ambulatory Visit: Payer: Medicare HMO | Admitting: Occupational Therapy

## 2020-03-10 ENCOUNTER — Ambulatory Visit: Payer: Medicare HMO

## 2020-03-10 DIAGNOSIS — R2689 Other abnormalities of gait and mobility: Secondary | ICD-10-CM

## 2020-03-10 DIAGNOSIS — M6281 Muscle weakness (generalized): Secondary | ICD-10-CM

## 2020-03-10 DIAGNOSIS — R278 Other lack of coordination: Secondary | ICD-10-CM

## 2020-03-10 DIAGNOSIS — S14129S Central cord syndrome at unspecified level of cervical spinal cord, sequela: Secondary | ICD-10-CM

## 2020-03-10 NOTE — Therapy (Unsigned)
[place] MAIN [facility] SERVICES [address] [place], [place], [address] Phone: [phone]   Fax:  [phone]  Physical Therapy Treatment/Physical Therapy Progress Note   Dates of reporting period 01/28/2020   to  03/10/2020  Patient Details  Name: Sherry [name] MRN: [id number] Date of Birth: March 07, 1934 Referring Provider (PT): Viviana Simpler   Encounter Date: 03/10/2020   PT End of Session - 03/10/20 2205    Visit Number 40    Number of Visits 70    Date for PT Re-Evaluation 04/07/20    Authorization Type [employer]na medicare FOTO performed by PT on eval (7/20), score 12, Progress note on 03/10/2020    PT Start Time 1347    PT Stop Time 1429    PT Time Calculation (min) 42 min    Equipment Utilized During Treatment Other (comment);Gait belt   lift   Activity Tolerance Patient tolerated treatment well;No increased pain;Patient limited by fatigue    Behavior During Therapy [facility] for tasks assessed/performed           Past Medical History:  Diagnosis Date  . Acute blood loss anemia   . Arthritis   . Cancer ([place])    skin  . Central cord syndrome at C6 level of cervical spinal cord ([place]) 11/29/2017  . Hypertension   . Protein-calorie malnutrition, severe ([place]) 01/24/2018  . S/P insertion of IVC (inferior vena caval) filter 01/24/2018  . 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[facility])     Past Surgical History:  Procedure Laterality Date  . ANTERIOR CERVICAL DECOMP/DISCECTOMY FUSION N/A 11/29/2017   Procedure: Cervical five-six, six-seven Anterior Cervical Decompression Fusion;  Surgeon: Judith Part, MD;  Location: [place];  Service: Neurosurgery;  Laterality: N/A;  Cervical five-six, six-seven Anterior Cervical Decompression Fusion  . CATARACT EXTRACTION    . IR IVC FILTER PLMT / S&I /IMG GUID/MOD SED  12/08/2017    There were no vitals filed for this visit.   Subjective Assessment - 03/10/20 2159    Subjective Patient reports no changes since last visit.  Daughter was on speaker phone initially during visit and expressed desire to push toward progressing transfers as patient was limited at medical appointments due to confined to her power chair.    Patient is accompained by: --   [name] (aide)   Pertinent History Pt is an 86 yo female that fell in 2019, fractured vertebrae in her neck and in her low back per family. 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 After hospital stay, pt discharged to [facility] for ~1 month, experienced severe UTI as well as bilateral DVT (IVC filter placed). Discharged to [facility], stayed for about a year, and then moved to [facility] in April 2021. Was receiving PT, but per family facility reported that did not have adequate equipment to maximize PT for her. Pt until about 1 month ago was practicing sit to stand transfers with 1-2 people, and working on static standing. Has a brace for L foot due to PF. Pt currently needs assistance with all ADLs (able to assist with donning/doffing her shirt), bed baths, and needs a hoyer lift for transfers. Able to drive power wheelchair without assistance.    Limitations Standing;Walking;House hold activities;Lifting    How long can you sit comfortably? NA    How long can you stand comfortably? unable    How long can you walk comfortably? unable    Patient Stated Goals "to stand up and walk"    Currently in Pain? No/denies  Reassess goals today- see goal section for details.  Transfer training: Patient performed stand pivot transfer with Max A of 1 with use of gait belt and patient holding onto PT arms. Patient perform transfer from power wheelchair to mat table and later back to chair. Poor ability to pivot or take a functional step with left LE. She may benefit from use of lite gait in future to practice standing.   Patient performed 3 trials of static standing with HHA x 2 persons (MOD A) each with standing times = 22 sec; 28 sec; 32 sec  respectively- Patient presents with poor ability to maintain hip extension during standing yet required no blocking of knees today during session.            PT Education - 03/10/20 2204    Education Details Patient educated in transfer techniques.    Person(s) Educated Patient    Methods Explanation;Demonstration;Tactile cues;Verbal cues    Comprehension Verbalized understanding;Returned demonstration;Verbal cues required;Tactile cues required;Need further instruction            PT Short Term Goals - 03/10/20 2211      PT SHORT TERM GOAL #1   Title The patient will perform initial HEP with minimum assistance in order to improve strength and function.    Baseline Patient demonstrating independence with initial HEP as of 03/10/2020 with no questions or difficulty.    Time 4    Period Weeks    Status Achieved    Target Date 02/11/20             PT Long Term Goals - 03/10/20 2213      PT LONG TERM GOAL #1   Title The patient will be compliant with finalized HEP with minimum assistance in preparation for self management and maintenance of condition.    Baseline 03/10/2020- Patient familiar with initial HEP and will keep goal active as HEP becomes more progressive including possible transfers and standing activities.    Time 12    Period Weeks    Status On-going    Target Date 04/07/20      PT LONG TERM GOAL #2   Title The patient will demonstrate at least 10 point improvement on FOTO score indicating an improved ability to perform functional activities.    Baseline on eval (/720) score 12; 10/22/19: 12; 12/10/19: 18, 12/13: 18    Time 12    Period Weeks    Status On-going    Target Date 04/07/20      PT LONG TERM GOAL #3   Title The patient will demonstrate lateral scooting  for assistance with transfers with minA to maximize independence.    Baseline 10/22/19: Pt requires modA+1 for lateral scooting; 12/10/19: modA+1, 12/13: modA-maxA, 12/27: modA-maxA, 03/10/2020-  ModA-MaxA- increased verbal cues and visual demonstration.    Time 12    Period Weeks    Status On-going    Target Date 04/07/20      PT LONG TERM GOAL #4   Title The patient will demonstrate a squat pivot transfer with minimum assistance to maximize independence and mobility.    Baseline 10/22/19: unable to safely attempt at this time; 12/10/19: unable to safely attempt at this time, 01/14/20: unable to safety attempt at this time. 03/10/2020- Patient able to perform Stand pivot transfer with Maximal assist.    Time 12    Period Weeks    Status On-going    Target Date 04/07/20      PT LONG TERM GOAL #  5   Title The patient will demonstrate sitting without UE support for 2-5 minutes at EOB to improve participation and maximize independence with ADLs.    Baseline 10/22/19: Pt able ot sit unsupported for at least 2 minutes at edge of bed    Time 12    Period Weeks    Status Achieved    Target Date 04/07/20                 Plan - 03/10/20 2208    Clinical Impression Statement Patient was reassessed today. Patient was able to transfer with max assist and even perform some static standing with Mod A x 2 persons. Patient verbalized excitement to be standing again and ability to transfer. She was able to stand without blocking knees yet exhibited increased genu valgus with right knee. She reports limited secondary to LE and hip weakness.Patient's condition has the potential to improve in response to therapy. Maximum improvement is yet to be obtained. The anticipated improvement is attainable and reasonable in a generally predictable time.    Personal Factors and Comorbidities Age;Time since onset of injury/illness/exacerbation;Comorbidity 3+;Fitness    Comorbidities HTN, quadriparesis, history of DVT, neurogenic bladder    Examination-Activity Limitations Bathing;Hygiene/Grooming;Squat;Bed Mobility;Lift;Bend;Stand;Engineer, manufacturing;Toileting;Self  Feeding;Transfers;Continence;Sit;Dressing;Sleep;Carry    Examination-Participation Restrictions Church;Laundry;Cleaning;Medication Management;Community Activity;Meal Prep;Interpersonal Relationship    Stability/Clinical Decision Making Evolving/Moderate complexity    Rehab Potential Fair    PT Frequency 2x / week    PT Duration 12 weeks    PT Treatment/Interventions ADLs/Self Care Home Management;Electrical Stimulation;Therapeutic activities;Wheelchair mobility training;Vasopneumatic Device;Joint Manipulations;Vestibular;Passive range of motion;Patient/family education;Therapeutic exercise;DME Instruction;Biofeedback;Aquatic Therapy;Moist Heat;Gait training;Balance training;Orthotic Fit/Training;Dry needling;Energy conservation;Taping;Splinting;Neuromuscular re-education;Cryotherapy;Ultrasound;Functional mobility training    PT Next Visit Plan Continue with progressive Standing/transfer training.    PT Home Exercise Plan Reviewed LE seated strengtheing and instructed patient to continue to work of glute sets and active Left LE knee flexion activities to assist with transfers.    Consulted and Agree with Plan of Care Patient;Family member/caregiver    Family Member Consulted Patient dtr- [name]           Patient will benefit from skilled therapeutic intervention in order to improve the following deficits and impairments:  Abnormal gait,Decreased balance,Decreased endurance,Decreased mobility,Difficulty walking,Hypomobility,Impaired sensation,Decreased range of motion,Improper body mechanics,Impaired perceived functional ability,Decreased activity tolerance,Decreased knowledge of use of DME,Decreased safety awareness,Decreased strength,Impaired flexibility,Impaired UE functional use,Postural dysfunction  Visit Diagnosis: Muscle weakness (generalized)  Other abnormalities of gait and mobility  Other lack of coordination  Central cord syndrome, sequela ([place])     Problem List Patient Active  Problem List   Diagnosis Date Noted  . Acute appendicitis with appendiceal abscess 05/30/2018  . Hypocalcemia 02/15/2018  . Dysuria 02/15/2018  . Bilateral lower extremity edema 02/11/2018  . Vaginal yeast infection 01/30/2018  . Chest tightness 01/27/2018  . Healthcare-associated pneumonia 01/25/2018  . Pleural effusion, not elsewhere classified 01/25/2018  . Acute deep vein thrombosis (DVT) of left lower extremity ([place]) 01/24/2018  . Acute deep vein thrombosis (DVT) of right lower extremity ([place]) 01/24/2018  . Chronic allergic rhinitis 01/24/2018  . Depression with anxiety 01/24/2018  . UTI due to Klebsiella species 01/24/2018  . Protein-calorie malnutrition, severe ([place]) 01/24/2018  . S/P insertion of IVC (inferior vena caval) filter 01/24/2018  . Reactive depression   . Hypokalemia   . Leukocytosis   . Essential hypertension   . Trauma   . 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([place])   . Neuropathic pain   . Neurogenic bowel   . Neurogenic bladder   .  Benign essential HTN   . Acute blood loss anemia   . Central cord syndrome at C6 level of cervical spinal cord ([place]) 11/29/2017  . Central cord syndrome [facility]) 11/29/2017    Lewis Moccasin, PT 03/11/2020, 11:11 AM  [place] MAIN [facility] SERVICES [address] [place], [place], [address] Phone: [phone]   Fax:  [phone]  Name: Sherry [name] MRN: [id number] Date of Birth: 14-Sep-1934

## 2020-03-10 NOTE — Therapy (Signed)
[place] MAIN [facility] SERVICES [address] [place], [place], [address] Phone: [phone]   Fax:  [phone]  Occupational Therapy Treatment  Patient Details  Name: Sherry [name] MRN: [id number] Date of Birth: 1934-12-10 No data recorded  Encounter Date: 03/10/2020   OT End of Session - 03/10/20 1315    Visit Number 33    Number of Visits 1    Date for OT Re-Evaluation 05/21/20    Authorization Time Period Progress report period starting 01/14/2020    OT Start Time 1307    OT Stop Time 1345    OT Time Calculation (min) 38 min    Activity Tolerance Patient tolerated treatment well    Behavior During Therapy [facility] for tasks assessed/performed           Past Medical History:  Diagnosis Date  . Acute blood loss anemia   . Arthritis   . Cancer ([place])    skin  . Central cord syndrome at C6 level of cervical spinal cord ([place]) 11/29/2017  . Hypertension   . Protein-calorie malnutrition, severe ([place]) 01/24/2018  . S/P insertion of IVC (inferior vena caval) filter 01/24/2018  . 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[facility])     Past Surgical History:  Procedure Laterality Date  . ANTERIOR CERVICAL DECOMP/DISCECTOMY FUSION N/A 11/29/2017   Procedure: Cervical five-six, six-seven Anterior Cervical Decompression Fusion;  Surgeon: Judith Part, MD;  Location: [place];  Service: Neurosurgery;  Laterality: N/A;  Cervical five-six, six-seven Anterior Cervical Decompression Fusion  . CATARACT EXTRACTION    . IR IVC FILTER PLMT / S&I /IMG GUID/MOD SED  12/08/2017    There were no vitals filed for this visit.   Subjective Assessment - 03/10/20 1315    Patient is accompanied by: Family member    Pertinent History Patient s/p fall November 29, 2017 resulting in diagnosis of central cord syndrome at C 6 level.  She has had therapy in multiple venues but with recent move to [facility], therapy staff recommended she seek outpatient therapy for her needs.    Currently in  Pain? No/denies          OT Treatment  Pt.tolerated bilateral shoulder flexion, extension, abduction, elbow flexion, extension forearm supination AAROM/AROM. PROM bilateral wrist extension, digit MP, PIP, and DIP flexion, and extension, thumb radial, and palmar abductionin conjunction with moist heat.   Pt.continues to make steadyprogress.Pt.continues toimprovewith BUE ROM and continues to work on improving overall functional reaching. Pt.has improved with increased MP, PIP, and DIP flexion, and working towards gross gripping.Pt.continues to beable to use her right thumb to hold items. Pt. tolerated ROM well without reports of pain, or discomfort. Pt.continues to benefit from working on improving ROM, and bilateral UE functioningin order to work towards increasing engagement of her bilateral hands during ADLs, and IADL tasks.                        OT Education - 03/10/20 1315    Education Details UE ROM    Person(s) Educated Patient;Caregiver(s)    Methods Explanation;Demonstration    Comprehension Verbalized understanding;Need further instruction               OT Long Term Goals - 02/27/20 1357      OT LONG TERM GOAL #1   Title Patient and caregiver will demonstrate understanding of home exercise program for ROM.    Baseline Pt. has a restorative aide rehab aide assist her wih exercises at  [place]    Time 12    Period Weeks    Status On-going    Target Date 05/21/20      OT LONG TERM GOAL #2   Title Patient will demonstrate ability to don shirt with min assist from seated position.    Baseline Pt. is indepedent with a larger shirt, and requires increased time to complete    Time 12    Period Weeks    Status On-going    Target Date 05/21/20      OT LONG TERM GOAL #4   Title Patient will demonstrate improved composite finger flexion to hold deodorant to apply to underarms.    Baseline Pt. continues to present with improving finger  flexion of R hand for a partial gross grasp, but unable to grasp and hold items. Very limited digit flexion of L hand. Pt. has active left thumb abduction.    Time 12    Period Weeks    Status On-going    Target Date 05/21/20      OT LONG TERM GOAL #6   Title Patient will increase right UE ROM to comb the back of her hair with modified independence.    Baseline Pt. continues to be able to comb the front, and sides of her head. Pt. can now reach the back, however cannot see what she is doing in the back of her head.    Time 12    Period Weeks    Status Partially Met    Target Date 05/21/20      OT LONG TERM GOAL #8   Title Pt. will write, and sign her name with 100% legibility, and modified independence.    Baseline Pt. is consistently filling out her daily menu, and writing for crossword puzzles. Pt. is able to maintain grasp on a wide width pen. [place] to work on Media planner.    Time 12    Period Weeks    Status On-going    Target Date 05/21/20                 Plan - 03/10/20 1316    Clinical Impression Statement Pt.continues to make steadyprogress.Pt.continues toimprovewith BUE ROM and continues to work on improving overall functional reaching. Pt.has improved with increased MP, PIP, and DIP flexion, and working towards gross gripping.Pt.continues to beable to use her right thumb to hold items. Pt. tolerated ROM well without reports of pain, or discomfort. Pt.continues to benefit from working on improving ROM, and bilateral UE functioningin order to work towards increasing engagement of her bilateral hands during ADLs, and IADL tasks.   OT Occupational Profile and History Detailed Assessment- Review of Records and additional review of physical, cognitive, psychosocial history related to current functional performance    Occupational performance deficits (Please refer to evaluation for details): ADL's;IADL's;Leisure;Social Participation    Body Structure /  Function / Physical Skills ADL;Continence;Dexterity;Flexibility;Strength;ROM;Balance;Coordination;FMC;IADL;Endurance;UE functional use;Decreased knowledge of use of DME;GMC    Psychosocial Skills Environmental  Adaptations;Habits;Routines and Behaviors    Rehab Potential Fair    Clinical Decision Making Limited treatment options, no task modification necessary    Comorbidities Affecting Occupational Performance: Presence of comorbidities impacting occupational performance    Comorbidities impacting occupational performance description: contractures of bilateral hands, dependent transfers,    OT Frequency 2x / week    OT Duration 12 weeks    OT Treatment/Interventions Self-care/ADL training;Cryotherapy;Therapeutic exercise;DME and/or AE instruction;Balance training;Neuromuscular education;Manual Therapy;Splinting;Moist Heat;Passive range of motion;Therapeutic activities;Patient/family education  Consulted and Agree with Plan of Care Patient           Patient will benefit from skilled therapeutic intervention in order to improve the following deficits and impairments:   Body Structure / Function / Physical Skills: ADL,Continence,Dexterity,Flexibility,Strength,ROM,Balance,Coordination,FMC,IADL,Endurance,UE functional use,Decreased knowledge of use of [facility]   Psychosocial Skills: Environmental  Adaptations,Habits,Routines and Behaviors   Visit Diagnosis: Muscle weakness (generalized)  Other lack of coordination    Problem List Patient Active Problem List   Diagnosis Date Noted  . Acute appendicitis with appendiceal abscess 05/30/2018  . Hypocalcemia 02/15/2018  . Dysuria 02/15/2018  . Bilateral lower extremity edema 02/11/2018  . Vaginal yeast infection 01/30/2018  . Chest tightness 01/27/2018  . Healthcare-associated pneumonia 01/25/2018  . Pleural effusion, not elsewhere classified 01/25/2018  . Acute deep vein thrombosis (DVT) of left lower extremity ([place]) 01/24/2018  .  Acute deep vein thrombosis (DVT) of right lower extremity ([place]) 01/24/2018  . Chronic allergic rhinitis 01/24/2018  . Depression with anxiety 01/24/2018  . UTI due to Klebsiella species 01/24/2018  . Protein-calorie malnutrition, severe ([place]) 01/24/2018  . S/P insertion of IVC (inferior vena caval) filter 01/24/2018  . Reactive depression   . Hypokalemia   . Leukocytosis   . Essential hypertension   . Trauma   . 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([place])   . Neuropathic pain   . Neurogenic bowel   . Neurogenic bladder   . Benign essential HTN   . Acute blood loss anemia   . Central cord syndrome at C6 level of cervical spinal cord ([place]) 11/29/2017  . Central cord syndrome [facility]) 11/29/2017    Harrel Carina, MS, OTR/L 03/10/2020, 1:52 PM  [place] MAIN [facility] SERVICES [address] [place], [place], [address] Phone: [phone]   Fax:  [phone]  Name: Sherry [name] MRN: [id number] Date of Birth: 1934/06/07

## 2020-03-12 ENCOUNTER — Encounter: Payer: Self-pay | Admitting: Occupational Therapy

## 2020-03-12 ENCOUNTER — Ambulatory Visit: Payer: Medicare HMO | Admitting: Occupational Therapy

## 2020-03-12 ENCOUNTER — Other Ambulatory Visit: Payer: Self-pay

## 2020-03-12 ENCOUNTER — Ambulatory Visit: Payer: Medicare HMO

## 2020-03-12 DIAGNOSIS — M6281 Muscle weakness (generalized): Secondary | ICD-10-CM | POA: Diagnosis not present

## 2020-03-12 DIAGNOSIS — R278 Other lack of coordination: Secondary | ICD-10-CM

## 2020-03-12 DIAGNOSIS — R2689 Other abnormalities of gait and mobility: Secondary | ICD-10-CM

## 2020-03-12 NOTE — Therapy (Signed)
[place] MAIN [facility] SERVICES [address] [place], [place], [address] Phone: [phone]   Fax:  [phone]  Occupational Therapy Treatment  Patient Details  Name: Sherry [name] MRN: [id number] Date of Birth: 01/07/1935 No data recorded  Encounter Date: 03/12/2020   OT End of Session - 03/12/20 1459    Visit Number 34    Number of Visits 8    Date for OT Re-Evaluation 05/21/20    Authorization Time Period Progress report period starting 02/27/2020    OT Start Time 1302    OT Stop Time 1345    OT Time Calculation (min) 43 min    Activity Tolerance Patient tolerated treatment well    Behavior During Therapy [facility] for tasks assessed/performed           Past Medical History:  Diagnosis Date  . Acute blood loss anemia   . Arthritis   . Cancer ([place])    skin  . Central cord syndrome at C6 level of cervical spinal cord ([place]) 11/29/2017  . Hypertension   . Protein-calorie malnutrition, severe ([place]) 01/24/2018  . S/P insertion of IVC (inferior vena caval) filter 01/24/2018  . Tetraparesis [facility])     Past Surgical History:  Procedure Laterality Date  . ANTERIOR CERVICAL DECOMP/DISCECTOMY FUSION N/A 11/29/2017   Procedure: Cervical five-six, six-seven Anterior Cervical Decompression Fusion;  Surgeon: Judith Part, MD;  Location: [place];  Service: Neurosurgery;  Laterality: N/A;  Cervical five-six, six-seven Anterior Cervical Decompression Fusion  . CATARACT EXTRACTION    . IR IVC FILTER PLMT / S&I /IMG GUID/MOD SED  12/08/2017    There were no vitals filed for this visit.   Subjective Assessment - 03/12/20 1459    Subjective  Pt. reports feeling okay today.    Patient is accompanied by: Family member    Pertinent History Patient s/p fall November 29, 2017 resulting in diagnosis of central cord syndrome at C 6 level.  She has had therapy in multiple venues but with recent move to [facility], therapy staff recommended she seek  outpatient therapy for her needs.    Currently in Pain? No/denies           OT Treatment  Pt.tolerated bilateral shoulder flexion, extension, abduction, elbow flexion, extension forearm supination AAROM/AROM. PROM bilateral wrist extension, digit MP, PIP, and DIP flexion, and extension, thumb radial, and palmar abductionin conjunction with moist heat.Pt. worked on reaching using the [employer]. Pt. Grasped and moved the shapes through 2 vertical dowels of varying heights, with 3 squares placed over the 3rd vertical dowel.  Pt.continues to make steadyprogress overall, and is tolerating moist heat modality, and BUE ROM.Pt.continues toimprovewith BUE ROM and continues to work on improving overall functional reaching. Pt.has improved with increased MP, PIP, and DIP flexion, and working towards gross gripping.Pt.continues to beable to use her right thumb to hold items. Pt. tolerated ROM well without reports of pain, or discomfort. Pt.continues to benefit from working on improving ROM, and bilateral UE functioningin order to work towards increasing engagement of her bilateral hands during ADLs, and IADL tasks.                        OT Education - 03/12/20 1459    Education Details UE ROM    Person(s) Educated Patient;Caregiver(s)    Methods Explanation;Demonstration    Comprehension Verbalized understanding;Need further instruction  OT Long Term Goals - 02/27/20 1357      OT LONG TERM GOAL #1   Title Patient and caregiver will demonstrate understanding of home exercise program for ROM.    Baseline Pt. has a restorative aide rehab aide assist her wih exercises at [facility]    Time 12    Period Weeks    Status On-going    Target Date 05/21/20      OT LONG TERM GOAL #2   Title Patient will demonstrate ability to don shirt with min assist from seated position.    Baseline Pt. is indepedent with a larger shirt, and requires increased  time to complete    Time 12    Period Weeks    Status On-going    Target Date 05/21/20      OT LONG TERM GOAL #4   Title Patient will demonstrate improved composite finger flexion to hold deodorant to apply to underarms.    Baseline Pt. continues to present with improving finger flexion of R hand for a partial gross grasp, but unable to grasp and hold items. Very limited digit flexion of L hand. Pt. has active left thumb abduction.    Time 12    Period Weeks    Status On-going    Target Date 05/21/20      OT LONG TERM GOAL #6   Title Patient will increase right UE ROM to comb the back of her hair with modified independence.    Baseline Pt. continues to be able to comb the front, and sides of her head. Pt. can now reach the back, however cannot see what she is doing in the back of her head.    Time 12    Period Weeks    Status Partially Met    Target Date 05/21/20      OT LONG TERM GOAL #8   Title Pt. will write, and sign her name with 100% legibility, and modified independence.    Baseline Pt. is consistently filling out her daily menu, and writing for crossword puzzles. Pt. is able to maintain grasp on a wide width pen. [place] to work on Media planner.    Time 12    Period Weeks    Status On-going    Target Date 05/21/20                 Plan - 03/12/20 1459    Clinical Impression Statement Pt.continues to make steadyprogress overall, and is tolerating moist heat modality, and BUE ROM.Pt.continues toimprovewith BUE ROM and continues to work on improving overall functional reaching. Pt.has improved with increased MP, PIP, and DIP flexion, and working towards gross gripping.Pt.continues to beable to use her right thumb to hold items. Pt. tolerated ROM well without reports of pain, or discomfort. Pt.continues to benefit from working on improving ROM, and bilateral UE functioningin order to work towards increasing engagement of her bilateral hands during ADLs,  and IADL tasks.   OT Occupational Profile and History Detailed Assessment- Review of Records and additional review of physical, cognitive, psychosocial history related to current functional performance    Occupational performance deficits (Please refer to evaluation for details): ADL's;IADL's;Leisure;Social Participation    Body Structure / Function / Physical Skills ADL;Continence;Dexterity;Flexibility;Strength;ROM;Balance;Coordination;FMC;IADL;Endurance;UE functional use;Decreased knowledge of use of DME;GMC    Psychosocial Skills Environmental  Adaptations;Habits;Routines and Behaviors    Rehab Potential Fair    Clinical Decision Making Limited treatment options, no task modification necessary    Comorbidities Affecting Occupational Performance:  Presence of comorbidities impacting occupational performance    Comorbidities impacting occupational performance description: contractures of bilateral hands, dependent transfers,    Modification or Assistance to Complete Evaluation  No modification of tasks or assist necessary to complete eval    OT Frequency 2x / week    OT Duration 12 weeks    OT Treatment/Interventions Self-care/ADL training;Cryotherapy;Therapeutic exercise;DME and/or AE instruction;Balance training;Neuromuscular education;Manual Therapy;Splinting;Moist Heat;Passive range of motion;Therapeutic activities;Patient/family education    Plan continue to progress ROM of digits, wrists and shoulders as it pertains to completion of ADL tasks that pt values.    Consulted and Agree with Plan of Care Patient           Patient will benefit from skilled therapeutic intervention in order to improve the following deficits and impairments:   Body Structure / Function / Physical Skills: ADL,Continence,Dexterity,Flexibility,Strength,ROM,Balance,Coordination,FMC,IADL,Endurance,UE functional use,Decreased knowledge of use of [facility]   Psychosocial Skills: Environmental  Adaptations,Habits,Routines  and Behaviors   Visit Diagnosis: Muscle weakness (generalized)    Problem List Patient Active Problem List   Diagnosis Date Noted  . Acute appendicitis with appendiceal abscess 05/30/2018  . Hypocalcemia 02/15/2018  . Dysuria 02/15/2018  . Bilateral lower extremity edema 02/11/2018  . Vaginal yeast infection 01/30/2018  . Chest tightness 01/27/2018  . Healthcare-associated pneumonia 01/25/2018  . Pleural effusion, not elsewhere classified 01/25/2018  . Acute deep vein thrombosis (DVT) of left lower extremity ([place]) 01/24/2018  . Acute deep vein thrombosis (DVT) of right lower extremity ([place]) 01/24/2018  . Chronic allergic rhinitis 01/24/2018  . Depression with anxiety 01/24/2018  . UTI due to Klebsiella species 01/24/2018  . Protein-calorie malnutrition, severe ([place]) 01/24/2018  . S/P insertion of IVC (inferior vena caval) filter 01/24/2018  . Reactive depression   . Hypokalemia   . Leukocytosis   . Essential hypertension   . Trauma   . Tetraparesis ([place])   . Neuropathic pain   . Neurogenic bowel   . Neurogenic bladder   . Benign essential HTN   . Acute blood loss anemia   . Central cord syndrome at C6 level of cervical spinal cord ([place]) 11/29/2017  . Central cord syndrome [facility]) 11/29/2017    Harrel Carina, MS, OTR/L 03/12/2020, 3:01 PM  [place] MAIN [facility] SERVICES [address] [place], [place], [address] Phone: [phone]   Fax:  [phone]  Name: Sherry [name] MRN: [id number] Date of Birth: [birth year]

## 2020-03-13 NOTE — Therapy (Signed)
[place] MAIN [facility] SERVICES [address] [place], [place], [address] Phone: [phone]   Fax:  [phone]  Physical Therapy Treatment  Patient Details  Name: Sherry [name] MRN: [id number] Date of Birth: Oct 05, 1934 Referring Provider (PT): Viviana Simpler   Encounter Date: 03/12/2020   PT End of Session - 03/12/20 0949    Visit Number 41    Number of Visits 4    Date for PT Re-Evaluation 04/07/20    Authorization Type a[employer]na medicare FOTO performed by PT on eval (7/20), score 12, Progress note on 03/10/2020    PT Start Time 1347    PT Stop Time 1428    PT Time Calculation (min) 41 min    Equipment Utilized During Treatment Other (comment);Gait belt   lift   Activity Tolerance Patient tolerated treatment well;No increased pain;Patient limited by fatigue    Behavior During Therapy [facility] for tasks assessed/performed           Past Medical History:  Diagnosis Date  . Acute blood loss anemia   . Arthritis   . Cancer ([place])    skin  . Central cord syndrome at C6 level of cervical spinal cord ([place]) 11/29/2017  . Hypertension   . Protein-calorie malnutrition, severe ([place]) 01/24/2018  . S/P insertion of IVC (inferior vena caval) filter 01/24/2018  . Tetraparesis [facility])     Past Surgical History:  Procedure Laterality Date  . ANTERIOR CERVICAL DECOMP/DISCECTOMY FUSION N/A 11/29/2017   Procedure: Cervical five-six, six-seven Anterior Cervical Decompression Fusion;  Surgeon: Judith Part, MD;  Location: [place];  Service: Neurosurgery;  Laterality: N/A;  Cervical five-six, six-seven Anterior Cervical Decompression Fusion  . CATARACT EXTRACTION    . IR IVC FILTER PLMT / S&I /IMG GUID/MOD SED  12/08/2017    There were no vitals filed for this visit.   Subjective Assessment - 03/12/20 1441    Subjective Patient reports minimal soreness after last session but doing well and excited to try to stand again.    Patient is accompained by:  --   [name] (aide)   Pertinent History Pt is an 86 yo female that fell in 2019, fractured vertebrae in her neck and in her low back per family. 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 After hospital stay, pt discharged to [facility] for ~1 month, experienced severe UTI as well as bilateral DVT (IVC filter placed). Discharged to [facility], stayed for about a year, and then moved to [facility] in April 2021. Was receiving PT, but per family facility reported that did not have adequate equipment to maximize PT for her. Pt until about 1 month ago was practicing sit to stand transfers with 1-2 people, and working on static standing. Has a brace for L foot due to PF. Pt currently needs assistance with all ADLs (able to assist with donning/doffing her shirt), bed baths, and needs a hoyer lift for transfers. Able to drive power wheelchair without assistance.    Limitations Standing;Walking;House hold activities;Lifting    How long can you sit comfortably? NA    How long can you stand comfortably? unable    How long can you walk comfortably? unable    Patient Stated Goals "to stand up and walk"    Currently in Pain? Yes    Pain Score 2     Pain Location Shoulder    Pain Orientation Right    Pain Descriptors / Indicators Aching    Pain Type Acute pain  Pain Onset In the past 7 days    Pain Frequency Intermittent    Aggravating Factors  active movement    Pain Relieving Factors rest    Multiple Pain Sites No             Therapeutic activity:   Patient performed dynamic kicking soccer ball x 4 min- increased difficulty kicking with Right LE (minimal activity) yet able to perform well with left LE.  Postural holding onto soccer ball and performing UE shoulder flex 2 sets of 10 reps.   Neuromuscular re-education:  Patient performed 4 trials of static standing in //bars-  1) 20 sec with increased difficulty standing erect- unable to position her center of gravity over her feet  and exhibited poor quad control on right and unable to bear weight through arms on bars due to poor grip.  2) 30 sec - holding onto back of PT's arms with slight improvement in ability to extend hips.  3) 35 sec- improved ability to stand erect 4) 30 sec - Patient fatigued yet able to stand more erect than previous attempts - stopped due to overall fatgiue.  Pt educated throughout session about proper posture and technique with exercises. Improved exercise technique, movement at target joints, use of target muscles after min to mod verbal, visual, tactile cues.                        PT Education - 03/13/20 0947    Education Details Postural cues with standing and for exercise technique.    Person(s) Educated Patient    Methods Explanation;Demonstration;Tactile cues;Verbal cues    Comprehension Verbalized understanding;Returned demonstration;Verbal cues required            PT Short Term Goals - 03/10/20 2211      PT SHORT TERM GOAL #1   Title The patient will perform initial HEP with minimum assistance in order to improve strength and function.    Baseline Patient demonstrating independence with initial HEP as of 03/10/2020 with no questions or difficulty.    Time 4    Period Weeks    Status Achieved    Target Date 02/11/20             PT Long Term Goals - 03/10/20 2213      PT LONG TERM GOAL #1   Title The patient will be compliant with finalized HEP with minimum assistance in preparation for self management and maintenance of condition.    Baseline 03/10/2020- Patient familiar with initial HEP and will keep goal active as HEP becomes more progressive including possible transfers and standing activities.    Time 12    Period Weeks    Status On-going    Target Date 04/07/20      PT LONG TERM GOAL #2   Title The patient will demonstrate at least 10 point improvement on FOTO score indicating an improved ability to perform functional activities.     Baseline on eval (/720) score 12; 10/22/19: 12; 12/10/19: 18, 12/13: 18    Time 12    Period Weeks    Status On-going    Target Date 04/07/20      PT LONG TERM GOAL #3   Title The patient will demonstrate lateral scooting  for assistance with transfers with minA to maximize independence.    Baseline 10/22/19: Pt requires modA+1 for lateral scooting; 12/10/19: modA+1, 12/13: modA-maxA, 12/27: modA-maxA, 03/10/2020- ModA-MaxA- increased verbal cues and visual demonstration.  Time 12    Period Weeks    Status On-going    Target Date 04/07/20      PT LONG TERM GOAL #4   Title The patient will demonstrate a squat pivot transfer with minimum assistance to maximize independence and mobility.    Baseline 10/22/19: unable to safely attempt at this time; 12/10/19: unable to safely attempt at this time, 01/14/20: unable to safety attempt at this time. 03/10/2020- Patient able to perform Stand pivot transfer with Maximal assist.    Time 12    Period Weeks    Status On-going    Target Date 04/07/20      PT LONG TERM GOAL #5   Title The patient will demonstrate sitting without UE support for 2-5 minutes at EOB to improve participation and maximize independence with ADLs.    Baseline 10/22/19: Pt able ot sit unsupported for at least 2 minutes at edge of bed    Time 12    Period Weeks    Status Achieved    Target Date 04/07/20                 Plan - 03/12/20 0950    Clinical Impression Statement Patient challenged with standing in //bars with increased bilaterally quad and hip extension weakness. She was able to respond to verbal and tactile cues for posture briefly but unable to maintain.Patient would benefit from continued PT services to increase mobility and strength to improve patient's quality of life.    Personal Factors and Comorbidities Age;Time since onset of injury/illness/exacerbation;Comorbidity 3+;Fitness    Comorbidities HTN, quadriparesis, history of DVT, neurogenic bladder     Examination-Activity Limitations Bathing;Hygiene/Grooming;Squat;Bed Mobility;Lift;Bend;Stand;Engineer, manufacturing;Toileting;Self Feeding;Transfers;Continence;Sit;Dressing;Sleep;Carry    Examination-Participation Restrictions Church;Laundry;Cleaning;Medication Management;Community Activity;Meal Prep;Interpersonal Relationship    Stability/Clinical Decision Making Evolving/Moderate complexity    Rehab Potential Fair    PT Frequency 2x / week    PT Duration 12 weeks    PT Treatment/Interventions ADLs/Self Care Home Management;Electrical Stimulation;Therapeutic activities;Wheelchair mobility training;Vasopneumatic Device;Joint Manipulations;Vestibular;Passive range of motion;Patient/family education;Therapeutic exercise;DME Instruction;Biofeedback;Aquatic Therapy;Moist Heat;Gait training;Balance training;Orthotic Fit/Training;Dry needling;Energy conservation;Taping;Splinting;Neuromuscular re-education;Cryotherapy;Ultrasound;Functional mobility training    PT Next Visit Plan Continue with progressive Standing/transfer training.    PT Home Exercise Plan Reviewed LE seated strengtheing and instructed patient to continue to work of glute sets and active Left LE knee flexion activities to assist with transfers.    Consulted and Agree with Plan of Care Patient           Patient will benefit from skilled therapeutic intervention in order to improve the following deficits and impairments:  Abnormal gait,Decreased balance,Decreased endurance,Decreased mobility,Difficulty walking,Hypomobility,Impaired sensation,Decreased range of motion,Improper body mechanics,Impaired perceived functional ability,Decreased activity tolerance,Decreased knowledge of use of DME,Decreased safety awareness,Decreased strength,Impaired flexibility,Impaired UE functional use,Postural dysfunction  Visit Diagnosis: Muscle weakness (generalized)  Other abnormalities of gait and mobility  Other lack of  coordination     Problem List Patient Active Problem List   Diagnosis Date Noted  . Acute appendicitis with appendiceal abscess 05/30/2018  . Hypocalcemia 02/15/2018  . Dysuria 02/15/2018  . Bilateral lower extremity edema 02/11/2018  . Vaginal yeast infection 01/30/2018  . Chest tightness 01/27/2018  . Healthcare-associated pneumonia 01/25/2018  . Pleural effusion, not elsewhere classified 01/25/2018  . Acute deep vein thrombosis (DVT) of left lower extremity ([place]) 01/24/2018  . Acute deep vein thrombosis (DVT) of right lower extremity ([place]) 01/24/2018  . Chronic allergic rhinitis 01/24/2018  . Depression with anxiety 01/24/2018  . UTI due to Klebsiella species 01/24/2018  .  Protein-calorie malnutrition, severe ([place]) 01/24/2018  . S/P insertion of IVC (inferior vena caval) filter 01/24/2018  . Reactive depression   . Hypokalemia   . Leukocytosis   . Essential hypertension   . Trauma   . Tetraparesis ([place])   . Neuropathic pain   . Neurogenic bowel   . Neurogenic bladder   . Benign essential HTN   . Acute blood loss anemia   . Central cord syndrome at C6 level of cervical spinal cord ([place]) 11/29/2017  . Central cord syndrome [facility]) 11/29/2017    Lewis Moccasin, PT 03/13/2020, 10:03 AM  [place] MAIN [facility] SERVICES [address] [place], [place], [address] Phone: [phone]   Fax:  [phone]  Name: LIONA WENGERT MRN: [id number] Date of Birth: [birth year]

## 2020-03-17 ENCOUNTER — Encounter: Payer: Self-pay | Admitting: Occupational Therapy

## 2020-03-17 ENCOUNTER — Other Ambulatory Visit: Payer: Self-pay

## 2020-03-17 ENCOUNTER — Ambulatory Visit: Payer: Medicare HMO

## 2020-03-17 ENCOUNTER — Ambulatory Visit: Payer: Medicare HMO | Admitting: Occupational Therapy

## 2020-03-17 DIAGNOSIS — R278 Other lack of coordination: Secondary | ICD-10-CM

## 2020-03-17 DIAGNOSIS — M6281 Muscle weakness (generalized): Secondary | ICD-10-CM

## 2020-03-17 DIAGNOSIS — R2689 Other abnormalities of gait and mobility: Secondary | ICD-10-CM

## 2020-03-17 DIAGNOSIS — S14129S Central cord syndrome at unspecified level of cervical spinal cord, sequela: Secondary | ICD-10-CM

## 2020-03-17 NOTE — Therapy (Signed)
[place] MAIN [facility] SERVICES [address] [place], [place], [address] Phone: [phone]   Fax:  [phone]  Occupational Therapy Treatment  Patient Details  Name: Sherry [name] MRN: [id number] Date of Birth: 01/30/1935 No data recorded  Encounter Date: 03/17/2020   OT End of Session - 03/17/20 1453    Visit Number 35    Number of Visits 70    Date for OT Re-Evaluation 05/21/20    Authorization Time Period Progress report period starting 02/27/2020    OT Start Time 1300    OT Stop Time 1345    OT Time Calculation (min) 45 min    Activity Tolerance Patient tolerated treatment well    Behavior During Therapy [facility] for tasks assessed/performed           Past Medical History:  Diagnosis Date  . Acute blood loss anemia   . Arthritis   . Cancer ([place])    skin  . Central cord syndrome at C6 level of cervical spinal cord ([place]) 11/29/2017  . Hypertension   . Protein-calorie malnutrition, severe ([place]) 01/24/2018  . S/P insertion of IVC (inferior vena caval) filter 01/24/2018  . Tetraparesis [facility])     Past Surgical History:  Procedure Laterality Date  . ANTERIOR CERVICAL DECOMP/DISCECTOMY FUSION N/A 11/29/2017   Procedure: Cervical five-six, six-seven Anterior Cervical Decompression Fusion;  Surgeon: Judith Part, MD;  Location: [place];  Service: Neurosurgery;  Laterality: N/A;  Cervical five-six, six-seven Anterior Cervical Decompression Fusion  . CATARACT EXTRACTION    . IR IVC FILTER PLMT / S&I /IMG GUID/MOD SED  12/08/2017    There were no vitals filed for this visit.   Subjective Assessment - 03/17/20 1452    Subjective  Pt. reports feeling okay today.    Patient is accompanied by: Family member    Pertinent History Patient s/p fall November 29, 2017 resulting in diagnosis of central cord syndrome at C 6 level.  She has had therapy in multiple venues but with recent move to [facility], therapy staff recommended she seek  outpatient therapy for her needs.    Patient Stated Goals Patient would like to be able to move better in bed, stand and perform self care tasks.    Currently in Pain? No/denies          OT Treatment  Pt.tolerated bilateral shoulder flexion, extension, abduction, elbow flexion, extension forearm supination AAROM/AROM. PROM bilateral wrist extension, digit MP, PIP, and DIP flexion, and extension, thumb radial, and palmar abductionin conjunction with moist heat.  Pt.continues to make steadyprogress overall, and is tolerating moist heat modality, and BUE ROM.Pt.continues toimprovewith BUE ROM and continues to work on improving overall functional reaching. Pt.has improved withincreasedMP, PIP, and DIP flexion, and working towards gross gripping.Pt.continues to beable to use her right thumb to hold items. Pt. tolerated ROM well without reports of pain, or discomfort. Pt.continues to benefit from working on improving ROM, and bilateral UE functioningin order to work towards increasing engagement of her bilateral hands during ADLs, and IADL tasks.                       OT Education - 03/17/20 1453    Education Details UE ROM    Person(s) Educated Patient;Caregiver(s)    Methods Explanation;Demonstration    Comprehension Verbalized understanding;Need further instruction               OT Long Term Goals - 02/27/20 1357  OT LONG TERM GOAL #1   Title Patient and caregiver will demonstrate understanding of home exercise program for ROM.    Baseline Pt. has a restorative aide rehab aide assist her wih exercises at [facility]    Time 12    Period Weeks    Status On-going    Target Date 05/21/20      OT LONG TERM GOAL #2   Title Patient will demonstrate ability to don shirt with min assist from seated position.    Baseline Pt. is indepedent with a larger shirt, and requires increased time to complete    Time 12    Period Weeks    Status On-going     Target Date 05/21/20      OT LONG TERM GOAL #4   Title Patient will demonstrate improved composite finger flexion to hold deodorant to apply to underarms.    Baseline Pt. continues to present with improving finger flexion of R hand for a partial gross grasp, but unable to grasp and hold items. Very limited digit flexion of L hand. Pt. has active left thumb abduction.    Time 12    Period Weeks    Status On-going    Target Date 05/21/20      OT LONG TERM GOAL #6   Title Patient will increase right UE ROM to comb the back of her hair with modified independence.    Baseline Pt. continues to be able to comb the front, and sides of her head. Pt. can now reach the back, however cannot see what she is doing in the back of her head.    Time 12    Period Weeks    Status Partially Met    Target Date 05/21/20      OT LONG TERM GOAL #8   Title Pt. will write, and sign her name with 100% legibility, and modified independence.    Baseline Pt. is consistently filling out her daily menu, and writing for crossword puzzles. Pt. is able to maintain grasp on a wide width pen. [place] to work on Media planner.    Time 12    Period Weeks    Status On-going    Target Date 05/21/20                 Plan - 03/17/20 1454    Clinical Impression Statement Pt.continues to make steadyprogress overall, and is tolerating moist heat modality, and BUE ROM.Pt.continues toimprovewith BUE ROM and continues to work on improving overall functional reaching. Pt.has improved withincreasedMP, PIP, and DIP flexion, and working towards gross gripping.Pt.continues to beable to use her right thumb to hold items. Pt. tolerated ROM well without reports of pain, or discomfort. Pt.continues to benefit from working on improving ROM, and bilateral UE functioningin order to work towards increasing engagement of her bilateral hands during ADLs, and IADL tasks.   OT Occupational Profile and History Detailed  Assessment- Review of Records and additional review of physical, cognitive, psychosocial history related to current functional performance    Occupational performance deficits (Please refer to evaluation for details): ADL's;IADL's;Leisure;Social Participation    Body Structure / Function / Physical Skills ADL;Continence;Dexterity;Flexibility;Strength;ROM;Balance;Coordination;FMC;IADL;Endurance;UE functional use;Decreased knowledge of use of DME;GMC    Psychosocial Skills Environmental  Adaptations;Habits;Routines and Behaviors    Rehab Potential Fair    Clinical Decision Making Limited treatment options, no task modification necessary    Comorbidities Affecting Occupational Performance: Presence of comorbidities impacting occupational performance    Comorbidities impacting occupational performance description:  contractures of bilateral hands, dependent transfers,    Modification or Assistance to Complete Evaluation  No modification of tasks or assist necessary to complete eval    OT Frequency 2x / week    OT Duration 12 weeks    OT Treatment/Interventions Self-care/ADL training;Cryotherapy;Therapeutic exercise;DME and/or AE instruction;Balance training;Neuromuscular education;Manual Therapy;Splinting;Moist Heat;Passive range of motion;Therapeutic activities;Patient/family education    Consulted and Agree with Plan of Care Patient           Patient will benefit from skilled therapeutic intervention in order to improve the following deficits and impairments:   Body Structure / Function / Physical Skills: ADL,Continence,Dexterity,Flexibility,Strength,ROM,Balance,Coordination,FMC,IADL,Endurance,UE functional use,Decreased knowledge of use of [facility]   Psychosocial Skills: Environmental  Adaptations,Habits,Routines and Behaviors   Visit Diagnosis: Muscle weakness (generalized)  Other lack of coordination    Problem List Patient Active Problem List   Diagnosis Date Noted  . Acute  appendicitis with appendiceal abscess 05/30/2018  . Hypocalcemia 02/15/2018  . Dysuria 02/15/2018  . Bilateral lower extremity edema 02/11/2018  . Vaginal yeast infection 01/30/2018  . Chest tightness 01/27/2018  . Healthcare-associated pneumonia 01/25/2018  . Pleural effusion, not elsewhere classified 01/25/2018  . Acute deep vein thrombosis (DVT) of left lower extremity ([place]) 01/24/2018  . Acute deep vein thrombosis (DVT) of right lower extremity ([place]) 01/24/2018  . Chronic allergic rhinitis 01/24/2018  . Depression with anxiety 01/24/2018  . UTI due to Klebsiella species 01/24/2018  . Protein-calorie malnutrition, severe ([place]) 01/24/2018  . S/P insertion of IVC (inferior vena caval) filter 01/24/2018  . Reactive depression   . Hypokalemia   . Leukocytosis   . Essential hypertension   . Trauma   . Tetraparesis ([place])   . Neuropathic pain   . Neurogenic bowel   . Neurogenic bladder   . Benign essential HTN   . Acute blood loss anemia   . Central cord syndrome at C6 level of cervical spinal cord ([place]) 11/29/2017  . Central cord syndrome [facility]) 11/29/2017    Harrel Carina, MS, OTR/L 03/17/2020, 2:56 PM  [place] MAIN [facility] SERVICES [address] [place], [place], [address] Phone: [phone]   Fax:  [phone]  Name: Sherry [name] MRN: [id number] Date of Birth: 18-Oct-1934

## 2020-03-17 NOTE — Therapy (Signed)
[place] MAIN [facility] SERVICES [address] [place], [place], [address] Phone: [phone]   Fax:  [phone]  Physical Therapy Treatment  Patient Details  Name: Sherry [name] MRN: [id number] Date of Birth: 1935-01-04 Referring Provider (PT): Viviana Simpler   Encounter Date: 03/17/2020    Past Medical History:  Diagnosis Date  . Acute blood loss anemia   . Arthritis   . Cancer ([place])    skin  . Central cord syndrome at C6 level of cervical spinal cord ([place]) 11/29/2017  . Hypertension   . Protein-calorie malnutrition, severe ([place]) 01/24/2018  . S/P insertion of IVC (inferior vena caval) filter 01/24/2018  . Tetraparesis [facility])     Past Surgical History:  Procedure Laterality Date  . ANTERIOR CERVICAL DECOMP/DISCECTOMY FUSION N/A 11/29/2017   Procedure: Cervical five-six, six-seven Anterior Cervical Decompression Fusion;  Surgeon: Judith Part, MD;  Location: [place];  Service: Neurosurgery;  Laterality: N/A;  Cervical five-six, six-seven Anterior Cervical Decompression Fusion  . CATARACT EXTRACTION    . IR IVC FILTER PLMT / S&I /IMG GUID/MOD SED  12/08/2017    There were no vitals filed for this visit.         Neuromuscular re-education:  Patient performed 4 trials of static standing in //bars-  1) 25 sec with Max A to stand and position hips/trunk forward  2) 44 sec - max A to stand yet able to position better and activate glutes more for improved standing with only mod A 3) 40 sec- Same as above- improved ability to stand erect yet decreased weight through right side. 4) 28  sec - Patient fatigued and continued to exhibit better ability to position toward center of gravity however unable to maintain.   Pt educated throughout session about proper posture and technique with exercises. Improved exercise technique, movement at target joints, use of target muscles after min to mod verbal, visual, tactile  cues.                                   PT Short Term Goals - 03/10/20 2211      PT SHORT TERM GOAL #1   Title The patient will perform initial HEP with minimum assistance in order to improve strength and function.    Baseline Patient demonstrating independence with initial HEP as of 03/10/2020 with no questions or difficulty.    Time 4    Period Weeks    Status Achieved    Target Date 02/11/20             PT Long Term Goals - 03/10/20 2213      PT LONG TERM GOAL #1   Title The patient will be compliant with finalized HEP with minimum assistance in preparation for self management and maintenance of condition.    Baseline 03/10/2020- Patient familiar with initial HEP and will keep goal active as HEP becomes more progressive including possible transfers and standing activities.    Time 12    Period Weeks    Status On-going    Target Date 04/07/20      PT LONG TERM GOAL #2   Title The patient will demonstrate at least 10 point improvement on FOTO score indicating an improved ability to perform functional activities.    Baseline on eval (/720) score 12; 10/22/19: 12; 12/10/19: 18, 12/13: 18    Time 12    Period Weeks  Status On-going    Target Date 04/07/20      PT LONG TERM GOAL #3   Title The patient will demonstrate lateral scooting  for assistance with transfers with minA to maximize independence.    Baseline 10/22/19: Pt requires modA+1 for lateral scooting; 12/10/19: modA+1, 12/13: modA-maxA, 12/27: modA-maxA, 03/10/2020- ModA-MaxA- increased verbal cues and visual demonstration.    Time 12    Period Weeks    Status On-going    Target Date 04/07/20      PT LONG TERM GOAL #4   Title The patient will demonstrate a squat pivot transfer with minimum assistance to maximize independence and mobility.    Baseline 10/22/19: unable to safely attempt at this time; 12/10/19: unable to safely attempt at this time, 01/14/20: unable to safety attempt at  this time. 03/10/2020- Patient able to perform Stand pivot transfer with Maximal assist.    Time 12    Period Weeks    Status On-going    Target Date 04/07/20      PT LONG TERM GOAL #5   Title The patient will demonstrate sitting without UE support for 2-5 minutes at EOB to improve participation and maximize independence with ADLs.    Baseline 10/22/19: Pt able ot sit unsupported for at least 2 minutes at edge of bed    Time 12    Period Weeks    Status Achieved    Target Date 04/07/20                  Patient will benefit from skilled therapeutic intervention in order to improve the following deficits and impairments:     Visit Diagnosis: No diagnosis found.     Problem List Patient Active Problem List   Diagnosis Date Noted  . Acute appendicitis with appendiceal abscess 05/30/2018  . Hypocalcemia 02/15/2018  . Dysuria 02/15/2018  . Bilateral lower extremity edema 02/11/2018  . Vaginal yeast infection 01/30/2018  . Chest tightness 01/27/2018  . Healthcare-associated pneumonia 01/25/2018  . Pleural effusion, not elsewhere classified 01/25/2018  . Acute deep vein thrombosis (DVT) of left lower extremity ([place]) 01/24/2018  . Acute deep vein thrombosis (DVT) of right lower extremity ([place]) 01/24/2018  . Chronic allergic rhinitis 01/24/2018  . Depression with anxiety 01/24/2018  . UTI due to Klebsiella species 01/24/2018  . Protein-calorie malnutrition, severe ([place]) 01/24/2018  . S/P insertion of IVC (inferior vena caval) filter 01/24/2018  . Reactive depression   . Hypokalemia   . Leukocytosis   . Essential hypertension   . Trauma   . Tetraparesis ([place])   . Neuropathic pain   . Neurogenic bowel   . Neurogenic bladder   . Benign essential HTN   . Acute blood loss anemia   . Central cord syndrome at C6 level of cervical spinal cord ([place]) 11/29/2017  . Central cord syndrome [facility]) 11/29/2017    Lewis Moccasin, PT 03/17/2020, 1:51 PM  [place] MAIN [facility] SERVICES [address] [place], [place], [address] Phone: [phone]   Fax:  [phone]  Name: JACKEE GLASNER MRN: [id number] Date of Birth: 01-17-1935

## 2020-03-19 ENCOUNTER — Ambulatory Visit: Payer: Medicare HMO

## 2020-03-19 ENCOUNTER — Other Ambulatory Visit: Payer: Self-pay

## 2020-03-19 ENCOUNTER — Encounter: Payer: Self-pay | Admitting: Occupational Therapy

## 2020-03-19 ENCOUNTER — Ambulatory Visit: Payer: Medicare HMO | Admitting: Occupational Therapy

## 2020-03-19 DIAGNOSIS — S14129S Central cord syndrome at unspecified level of cervical spinal cord, sequela: Secondary | ICD-10-CM

## 2020-03-19 DIAGNOSIS — R278 Other lack of coordination: Secondary | ICD-10-CM

## 2020-03-19 DIAGNOSIS — M6281 Muscle weakness (generalized): Secondary | ICD-10-CM

## 2020-03-19 DIAGNOSIS — R2689 Other abnormalities of gait and mobility: Secondary | ICD-10-CM

## 2020-03-19 NOTE — Therapy (Signed)
[place] MAIN [facility] SERVICES [address] [place], [place], [address] Phone: [phone]   Fax:  [phone]  Occupational Therapy Treatment  Patient Details  Name: Sherry [name] MRN: [id number] Date of Birth: [birth year] No data recorded  Encounter Date: 03/19/2020   OT End of Session - 03/19/20 1308    Visit Number 36    Number of Visits 2    Date for OT Re-Evaluation 05/21/20    Authorization Time Period Progress report period starting 02/27/2020    OT Start Time 1300    OT Stop Time 1345    OT Time Calculation (min) 45 min    Activity Tolerance Patient tolerated treatment well    Behavior During Therapy [facility] for tasks assessed/performed           Past Medical History:  Diagnosis Date  . Acute blood loss anemia   . Arthritis   . Cancer ([place])    skin  . Central cord syndrome at C6 level of cervical spinal cord ([place]) 11/29/2017  . Hypertension   . Protein-calorie malnutrition, severe ([place]) 01/24/2018  . S/P insertion of IVC (inferior vena caval) filter 01/24/2018  . Tetraparesis [facility])     Past Surgical History:  Procedure Laterality Date  . ANTERIOR CERVICAL DECOMP/DISCECTOMY FUSION N/A 11/29/2017   Procedure: Cervical five-six, six-seven Anterior Cervical Decompression Fusion;  Surgeon: Judith Part, MD;  Location: [place];  Service: Neurosurgery;  Laterality: N/A;  Cervical five-six, six-seven Anterior Cervical Decompression Fusion  . CATARACT EXTRACTION    . IR IVC FILTER PLMT / S&I /IMG GUID/MOD SED  12/08/2017      OT Treatment  Pt.tolerated bilateral shoulder flexion, extension, abduction, elbow flexion, extension forearm supination AAROM/AROM. PROM bilateral wrist extension, digit MP, PIP, and DIP flexion, and extension, thumb radial, and palmar abductionin conjunction with moist heat.  Pt.continues to make steadyprogressoverall, and is tolerating moist heat modality, and BUE ROM.Pt.continues  toimprovewith BUE ROM and continues to work on improving overall functional reaching. Pt.has improved withincreasedMP, PIP, and DIP flexion, and working towards gross gripping.Pt.continues to beable to use her right thumb to hold items. Pt. tolerated ROM well without reports of pain, or discomfort. Pt.continues to benefit from working on improving ROM, and bilateral UE functioningin order to work towards increasing engagement of her bilateral hands during ADLs, and IADL tasks.                      OT Education - 03/19/20 1308    Education Details UE ROM    Person(s) Educated Patient;Caregiver(s)    Methods Explanation;Demonstration    Comprehension Verbalized understanding;Need further instruction               OT Long Term Goals - 02/27/20 1357      OT LONG TERM GOAL #1   Title Patient and caregiver will demonstrate understanding of home exercise program for ROM.    Baseline Pt. has a restorative aide rehab aide assist her wih exercises at [facility]    Time 12    Period Weeks    Status On-going    Target Date 05/21/20      OT LONG TERM GOAL #2   Title Patient will demonstrate ability to don shirt with min assist from seated position.    Baseline Pt. is indepedent with a larger shirt, and requires increased time to complete    Time 12    Period Weeks    Status On-going  Target Date 05/21/20      OT LONG TERM GOAL #4   Title Patient will demonstrate improved composite finger flexion to hold deodorant to apply to underarms.    Baseline Pt. continues to present with improving finger flexion of R hand for a partial gross grasp, but unable to grasp and hold items. Very limited digit flexion of L hand. Pt. has active left thumb abduction.    Time 12    Period Weeks    Status On-going    Target Date 05/21/20      OT LONG TERM GOAL #6   Title Patient will increase right UE ROM to comb the back of her hair with modified independence.    Baseline Pt.  continues to be able to comb the front, and sides of her head. Pt. can now reach the back, however cannot see what she is doing in the back of her head.    Time 12    Period Weeks    Status Partially Met    Target Date 05/21/20      OT LONG TERM GOAL #8   Title Pt. will write, and sign her name with 100% legibility, and modified independence.    Baseline Pt. is consistently filling out her daily menu, and writing for crossword puzzles. Pt. is able to maintain grasp on a wide width pen. [place] to work on Media planner.    Time 12    Period Weeks    Status On-going    Target Date 05/21/20                 Plan - 03/19/20 1308    Clinical Impression Statement Pt.continues to make steadyprogressoverall, and is tolerating moist heat modality, and BUE ROM.Pt.continues toimprovewith BUE ROM and continues to work on improving overall functional reaching. Pt.has improved withincreasedMP, PIP, and DIP flexion, and working towards gross gripping.Pt.continues to beable to use her right thumb to hold items. Pt. tolerated ROM well without reports of pain, or discomfort. Pt.continues to benefit from working on improving ROM, and bilateral UE functioningin order to work towards increasing engagement of her bilateral hands during ADLs, and IADL tasks.   OT Occupational Profile and History Detailed Assessment- Review of Records and additional review of physical, cognitive, psychosocial history related to current functional performance    Occupational performance deficits (Please refer to evaluation for details): ADL's;IADL's;Leisure;Social Participation    Body Structure / Function / Physical Skills ADL;Continence;Dexterity;Flexibility;Strength;ROM;Balance;Coordination;FMC;IADL;Endurance;UE functional use;Decreased knowledge of use of DME;GMC    Psychosocial Skills Environmental  Adaptations;Habits;Routines and Behaviors    Rehab Potential Fair    Clinical Decision Making Limited  treatment options, no task modification necessary    Comorbidities Affecting Occupational Performance: Presence of comorbidities impacting occupational performance    Comorbidities impacting occupational performance description: contractures of bilateral hands, dependent transfers,    Modification or Assistance to Complete Evaluation  No modification of tasks or assist necessary to complete eval    OT Frequency 2x / week    OT Duration 12 weeks    OT Treatment/Interventions Self-care/ADL training;Cryotherapy;Therapeutic exercise;DME and/or AE instruction;Balance training;Neuromuscular education;Manual Therapy;Splinting;Moist Heat;Passive range of motion;Therapeutic activities;Patient/family education    Plan continue to progress ROM of digits, wrists and shoulders as it pertains to completion of ADL tasks that pt values.    Consulted and Agree with Plan of Care Patient           Patient will benefit from skilled therapeutic intervention in order to improve the following deficits and  impairments:   Body Structure / Function / Physical Skills: ADL,Continence,Dexterity,Flexibility,Strength,ROM,Balance,Coordination,FMC,IADL,Endurance,UE functional use,Decreased knowledge of use of DME,GMC   Psychosocial Skills: Environmental  Adaptations,Habits,Routines and Behaviors   Visit Diagnosis: Muscle weakness (generalized)    Problem List Patient Active Problem List   Diagnosis Date Noted  . Acute appendicitis with appendiceal abscess 05/30/2018  . Hypocalcemia 02/15/2018  . Dysuria 02/15/2018  . Bilateral lower extremity edema 02/11/2018  . Vaginal yeast infection 01/30/2018  . Chest tightness 01/27/2018  . Healthcare-associated pneumonia 01/25/2018  . Pleural effusion, not elsewhere classified 01/25/2018  . Acute deep vein thrombosis (DVT) of left lower extremity ([place]) 01/24/2018  . Acute deep vein thrombosis (DVT) of right lower extremity ([place]) 01/24/2018  . Chronic allergic rhinitis  01/24/2018  . Depression with anxiety 01/24/2018  . UTI due to Klebsiella species 01/24/2018  . Protein-calorie malnutrition, severe ([place]) 01/24/2018  . S/P insertion of IVC (inferior vena caval) filter 01/24/2018  . Reactive depression   . Hypokalemia   . Leukocytosis   . Essential hypertension   . Trauma   . Tetraparesis ([place])   . Neuropathic pain   . Neurogenic bowel   . Neurogenic bladder   . Benign essential HTN   . Acute blood loss anemia   . Central cord syndrome at C6 level of cervical spinal cord ([place]) 11/29/2017  . Central cord syndrome [facility]) 11/29/2017    Harrel Carina, MS, OTR/L 03/19/2020, 1:11 PM  [place] MAIN [facility] SERVICES [address] [place], [place], [address] Phone: [phone]   Fax:  [phone]  Name: Sherry [name] MRN: [id number] Date of Birth: [birth year]

## 2020-03-19 NOTE — Therapy (Signed)
[place] MAIN [facility] SERVICES [address] [place], [place], [address] Phone: [phone]   Fax:  [phone]  Physical Therapy Treatment  Patient Details  Name: Sherry [name] MRN: [id number] Date of Birth: [birth year] Referring Provider (PT): Viviana Simpler   Encounter Date: 03/19/2020   PT End of Session - 03/19/20 1536    Visit Number 43    Number of Visits 54    Date for PT Re-Evaluation 04/07/20    Authorization Type aetna medicare FOTO performed by PT on eval (7/20), score 12, Progress note on 03/10/2020    PT Start Time 1345    PT Stop Time 1428    PT Time Calculation (min) 43 min    Equipment Utilized During Treatment Other (comment);Gait belt   lift   Activity Tolerance Patient tolerated treatment well;No increased pain;Patient limited by fatigue    Behavior During Therapy [facility] for tasks assessed/performed           Past Medical History:  Diagnosis Date  . Acute blood loss anemia   . Arthritis   . Cancer ([place])    skin  . Central cord syndrome at C6 level of cervical spinal cord ([place]) 11/29/2017  . Hypertension   . Protein-calorie malnutrition, severe ([place]) 01/24/2018  . S/P insertion of IVC (inferior vena caval) filter 01/24/2018  . Tetraparesis [facility])     Past Surgical History:  Procedure Laterality Date  . ANTERIOR CERVICAL DECOMP/DISCECTOMY FUSION N/A 11/29/2017   Procedure: Cervical five-six, six-seven Anterior Cervical Decompression Fusion;  Surgeon: Judith Part, MD;  Location: [place];  Service: Neurosurgery;  Laterality: N/A;  Cervical five-six, six-seven Anterior Cervical Decompression Fusion  . CATARACT EXTRACTION    . IR IVC FILTER PLMT / S&I /IMG GUID/MOD SED  12/08/2017    There were no vitals filed for this visit.   Subjective Assessment - 03/19/20 1351    Subjective Patient reports she was fatigued after last visit but doing well overall and no pain today.    Patient is accompained by: --    [name] (aide)   Pertinent History Pt is an 86 yo female that fell in 2019, fractured vertebrae in her neck and in her low back per family. 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 After hospital stay, pt discharged to [facility] for ~1 month, experienced severe UTI as well as bilateral DVT (IVC filter placed). Discharged to [facility], stayed for about a year, and then moved to [facility] in April 2021. Was receiving PT, but per family facility reported that did not have adequate equipment to maximize PT for her. Pt until about 1 month ago was practicing sit to stand transfers with 1-2 people, and working on static standing. Has a brace for L foot due to PF. Pt currently needs assistance with all ADLs (able to assist with donning/doffing her shirt), bed baths, and needs a hoyer lift for transfers. Able to drive power wheelchair without assistance.    Limitations Standing;Walking;House hold activities;Lifting    How long can you sit comfortably? NA    How long can you stand comfortably? unable    How long can you walk comfortably? unable    Patient Stated Goals "to stand up and walk"    Currently in Pain? No/denies    Pain Onset 1 to 4 weeks ago           Therex:   Hip march (AAROM on right LE) x 10 and (AROM on Left LE)  x 10 Knee ext- 2 sets of 10 reps each leg- AAROM on right yet instructed patient to hold contraction and slowly return for improved eccentric control. Patient demonstrated much improved concentric and eccentric control on Right LE      Neuromuscular re-education:  Patient performed 4 trials of static standing in //bars-  1) 40 sec with Max A sit to stand stand and position hips/trunk forward - once she was centered she was able to engage core/glutes/UE support more with less physical assistance.  2) 48 sec - Poor ability to stand erect - yet once her hips were positioned forward with max assist - improved ability to stand erect.  3) 52 sec- Able to stand more  erect with less physical assist 4) 20  sec - Patient fatigued - and unable to assist herself shift forward requiring max assist. Pt educated throughout session about proper posture and technique with exercises. Improved exercise technique, movement at target joints, use of target muscles after min to mod verbal, visual, tactile cues.                         PT Education - 03/19/20 1536    Education Details Postural cues with standing today.    Person(s) Educated Patient    Methods Explanation;Demonstration;Verbal cues;Tactile cues    Comprehension Verbalized understanding;Tactile cues required;Returned demonstration;Verbal cues required;Need further instruction            PT Short Term Goals - 03/10/20 2211      PT SHORT TERM GOAL #1   Title The patient will perform initial HEP with minimum assistance in order to improve strength and function.    Baseline Patient demonstrating independence with initial HEP as of 03/10/2020 with no questions or difficulty.    Time 4    Period Weeks    Status Achieved    Target Date 02/11/20             PT Long Term Goals - 03/10/20 2213      PT LONG TERM GOAL #1   Title The patient will be compliant with finalized HEP with minimum assistance in preparation for self management and maintenance of condition.    Baseline 03/10/2020- Patient familiar with initial HEP and will keep goal active as HEP becomes more progressive including possible transfers and standing activities.    Time 12    Period Weeks    Status On-going    Target Date 04/07/20      PT LONG TERM GOAL #2   Title The patient will demonstrate at least 10 point improvement on FOTO score indicating an improved ability to perform functional activities.    Baseline on eval (/720) score 12; 10/22/19: 12; 12/10/19: 18, 12/13: 18    Time 12    Period Weeks    Status On-going    Target Date 04/07/20      PT LONG TERM GOAL #3   Title The patient will demonstrate  lateral scooting  for assistance with transfers with minA to maximize independence.    Baseline 10/22/19: Pt requires modA+1 for lateral scooting; 12/10/19: modA+1, 12/13: modA-maxA, 12/27: modA-maxA, 03/10/2020- ModA-MaxA- increased verbal cues and visual demonstration.    Time 12    Period Weeks    Status On-going    Target Date 04/07/20      PT LONG TERM GOAL #4   Title The patient will demonstrate a squat pivot transfer with minimum assistance to maximize independence and mobility.  Baseline 10/22/19: unable to safely attempt at this time; 12/10/19: unable to safely attempt at this time, 01/14/20: unable to safety attempt at this time. 03/10/2020- Patient able to perform Stand pivot transfer with Maximal assist.    Time 12    Period Weeks    Status On-going    Target Date 04/07/20      PT LONG TERM GOAL #5   Title The patient will demonstrate sitting without UE support for 2-5 minutes at EOB to improve participation and maximize independence with ADLs.    Baseline 10/22/19: Pt able ot sit unsupported for at least 2 minutes at edge of bed    Time 12    Period Weeks    Status Achieved    Target Date 04/07/20                 Plan - 03/19/20 1538    Clinical Impression Statement Patient presents with improved ability to stand erect with less physical assistance today but difficulty maintaining position. Once she was position with her hips over her feet she was able to engage her glutes and UE support more with much improved standing endurance and quality today. Patient would benefit from continued PT services to increase mobility and strength to improve patient's quality of life.    Personal Factors and Comorbidities Age;Time since onset of injury/illness/exacerbation;Comorbidity 3+;Fitness    Comorbidities HTN, quadriparesis, history of DVT, neurogenic bladder    Examination-Activity Limitations Bathing;Hygiene/Grooming;Squat;Bed Mobility;Lift;Bend;Stand;Futures trader;Toileting;Self Feeding;Transfers;Continence;Sit;Dressing;Sleep;Carry    Examination-Participation Restrictions Church;Laundry;Cleaning;Medication Management;Community Activity;Meal Prep;Interpersonal Relationship    Stability/Clinical Decision Making Evolving/Moderate complexity    Rehab Potential Fair    PT Frequency 2x / week    PT Duration 12 weeks    PT Treatment/Interventions ADLs/Self Care Home Management;Electrical Stimulation;Therapeutic activities;Wheelchair mobility training;Vasopneumatic Device;Joint Manipulations;Vestibular;Passive range of motion;Patient/family education;Therapeutic exercise;DME Instruction;Biofeedback;Aquatic Therapy;Moist Heat;Gait training;Balance training;Orthotic Fit/Training;Dry needling;Energy conservation;Taping;Splinting;Neuromuscular re-education;Cryotherapy;Ultrasound;Functional mobility training    PT Next Visit Plan Continue with progressive Standing/transfer training.    Consulted and Agree with Plan of Care Patient           Patient will benefit from skilled therapeutic intervention in order to improve the following deficits and impairments:  Abnormal gait,Decreased balance,Decreased endurance,Decreased mobility,Difficulty walking,Hypomobility,Impaired sensation,Decreased range of motion,Improper body mechanics,Impaired perceived functional ability,Decreased activity tolerance,Decreased knowledge of use of DME,Decreased safety awareness,Decreased strength,Impaired flexibility,Impaired UE functional use,Postural dysfunction  Visit Diagnosis: Muscle weakness (generalized)  Other lack of coordination  Other abnormalities of gait and mobility  Central cord syndrome, sequela ([place])     Problem List Patient Active Problem List   Diagnosis Date Noted  . Acute appendicitis with appendiceal abscess 05/30/2018  . Hypocalcemia 02/15/2018  . Dysuria 02/15/2018  . Bilateral lower extremity edema 02/11/2018  . Vaginal yeast infection  01/30/2018  . Chest tightness 01/27/2018  . Healthcare-associated pneumonia 01/25/2018  . Pleural effusion, not elsewhere classified 01/25/2018  . Acute deep vein thrombosis (DVT) of left lower extremity ([place]) 01/24/2018  . Acute deep vein thrombosis (DVT) of right lower extremity ([place]) 01/24/2018  . Chronic allergic rhinitis 01/24/2018  . Depression with anxiety 01/24/2018  . UTI due to Klebsiella species 01/24/2018  . Protein-calorie malnutrition, severe ([place]) 01/24/2018  . S/P insertion of IVC (inferior vena caval) filter 01/24/2018  . Reactive depression   . Hypokalemia   . Leukocytosis   . Essential hypertension   . Trauma   . Tetraparesis ([place])   . Neuropathic pain   . Neurogenic bowel   . Neurogenic bladder   .  Benign essential HTN   . Acute blood loss anemia   . Central cord syndrome at C6 level of cervical spinal cord ([place]) 11/29/2017  . Central cord syndrome [facility]) 11/29/2017    Lewis Moccasin, PT 03/19/2020, 3:47 PM  [place] MAIN [facility] SERVICES [address] [place], [place], [address] Phone: [phone]   Fax:  [phone]  Name: Sherry [name] MRN: [id number] Date of Birth: 1934/04/11

## 2020-03-24 ENCOUNTER — Encounter: Payer: Self-pay | Admitting: Occupational Therapy

## 2020-03-24 ENCOUNTER — Ambulatory Visit: Payer: Medicare HMO | Admitting: Occupational Therapy

## 2020-03-24 ENCOUNTER — Ambulatory Visit: Payer: Medicare HMO

## 2020-03-24 ENCOUNTER — Other Ambulatory Visit: Payer: Self-pay

## 2020-03-24 DIAGNOSIS — M6281 Muscle weakness (generalized): Secondary | ICD-10-CM | POA: Diagnosis not present

## 2020-03-24 DIAGNOSIS — R278 Other lack of coordination: Secondary | ICD-10-CM

## 2020-03-24 DIAGNOSIS — R2689 Other abnormalities of gait and mobility: Secondary | ICD-10-CM

## 2020-03-24 NOTE — Therapy (Signed)
[place] MAIN [facility] SERVICES [address] [place], [place], [address] Phone: [phone]   Fax:  [phone]  Physical Therapy Treatment  Patient Details  Name: Sherry [name] MRN: [id number] Date of Birth: 07-02-1934 Referring Provider (PT): Viviana Simpler   Encounter Date: 03/24/2020   PT End of Session - 03/24/20 1351    Visit Number 44    Number of Visits 54    Date for PT Re-Evaluation 04/07/20    Authorization Type aetna medicare FOTO performed by PT on eval (7/20), score 12, Progress note on 03/10/2020    PT Start Time 1348    PT Stop Time 1419    PT Time Calculation (min) 31 min    Equipment Utilized During Treatment Other (comment);Gait belt   lift   Activity Tolerance Patient tolerated treatment well;No increased pain;Patient limited by fatigue    Behavior During Therapy [facility] for tasks assessed/performed           Past Medical History:  Diagnosis Date  . Acute blood loss anemia   . Arthritis   . Cancer ([place])    skin  . Central cord syndrome at C6 level of cervical spinal cord ([place]) 11/29/2017  . Hypertension   . Protein-calorie malnutrition, severe ([place]) 01/24/2018  . S/P insertion of IVC (inferior vena caval) filter 01/24/2018  . Tetraparesis [facility])     Past Surgical History:  Procedure Laterality Date  . ANTERIOR CERVICAL DECOMP/DISCECTOMY FUSION N/A 11/29/2017   Procedure: Cervical five-six, six-seven Anterior Cervical Decompression Fusion;  Surgeon: Judith Part, MD;  Location: [place];  Service: Neurosurgery;  Laterality: N/A;  Cervical five-six, six-seven Anterior Cervical Decompression Fusion  . CATARACT EXTRACTION    . IR IVC FILTER PLMT / S&I /IMG GUID/MOD SED  12/08/2017    There were no vitals filed for this visit.   Subjective Assessment - 03/24/20 1349    Subjective Patient reports doing well today with no issues. States she has been compliant performing her exercises including bridging.     Patient is accompained by: --   [name] (aide)   Pertinent History Pt is an 86 yo female that fell in 2019, fractured vertebrae in her neck and in her low back per family. 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 After hospital stay, pt discharged to CIR for ~1 month, experienced severe UTI as well as bilateral DVT (IVC filter placed). Discharged to [facility], stayed for about a year, and then moved to [facility] in April 2021. Was receiving PT, but per family facility reported that did not have adequate equipment to maximize PT for her. Pt until about 1 month ago was practicing sit to stand transfers with 1-2 people, and working on static standing. Has a brace for L foot due to PF. Pt currently needs assistance with all ADLs (able to assist with donning/doffing her shirt), bed baths, and needs a hoyer lift for transfers. Able to drive power wheelchair without assistance.    Limitations Standing;Walking;House hold activities;Lifting    How long can you sit comfortably? NA    How long can you stand comfortably? unable    How long can you walk comfortably? unable    Patient Stated Goals "to stand up and walk"    Currently in Pain? No/denies    Pain Onset 1 to 4 weeks ago                      Therex:  Hip march (initally started at AROM with minimal activation so switched to [facility] on right LE) x 15 and (AROM on Left LE) x 15 Knee ext- 2 sets of 10 reps each leg- AROM on right (able to raise leg through approx 1/2 ROM)  and 2 sec hold on left LE.       Neuromuscular re-education:  Patient performed 4 trials of static standing in //bars-  1) 33 sec with Max A sit to stand stand and position hips/trunk forward - once she was centered she was able to engage core/glutes/UE support  2) 64 sec - Poor ability to stand erect - yet once her hips were positioned forward with max assist - improved ability to stand erect and able to decrease assist level to Mod A.   3)  54  sec- More difficulty obtaining erect posture from sit to stand but did improve  4) 71  sec - Patient was able to maintain her glute and quad contract - longer progressed from Max A to Mod A.   Patient reports exhausted after standing activity so terminated session.   Pt educated throughout session about proper posture and technique with exercises. Improved exercise technique, movement at target joints, use of target muscles after min to mod verbal, visual, tactile cues.                         PT Education - 03/25/20 [id number]    Education Details Education on importance of standing/exercises at home to improve her condition    Person(s) Educated Patient    Methods Explanation;Demonstration;Tactile cues;Verbal cues    Comprehension Verbalized understanding;Returned demonstration;Verbal cues required;Tactile cues required;Need further instruction            PT Short Term Goals - 03/10/20 2211      PT SHORT TERM GOAL #1   Title The patient will perform initial HEP with minimum assistance in order to improve strength and function.    Baseline Patient demonstrating independence with initial HEP as of 03/10/2020 with no questions or difficulty.    Time 4    Period Weeks    Status Achieved    Target Date 02/11/20             PT Long Term Goals - 03/10/20 2213      PT LONG TERM GOAL #1   Title The patient will be compliant with finalized HEP with minimum assistance in preparation for self management and maintenance of condition.    Baseline 03/10/2020- Patient familiar with initial HEP and will keep goal active as HEP becomes more progressive including possible transfers and standing activities.    Time 12    Period Weeks    Status On-going    Target Date 04/07/20      PT LONG TERM GOAL #2   Title The patient will demonstrate at least 10 point improvement on FOTO score indicating an improved ability to perform functional activities.    Baseline on eval (/720)  score 12; 10/22/19: 12; 12/10/19: 18, 12/13: 18    Time 12    Period Weeks    Status On-going    Target Date 04/07/20      PT LONG TERM GOAL #3   Title The patient will demonstrate lateral scooting  for assistance with transfers with minA to maximize independence.    Baseline 10/22/19: Pt requires modA+1 for lateral scooting; 12/10/19: modA+1, 12/13: modA-maxA, 12/27: modA-maxA, 03/10/2020- ModA-MaxA- increased verbal cues and visual  demonstration.    Time 12    Period Weeks    Status On-going    Target Date 04/07/20      PT LONG TERM GOAL #4   Title The patient will demonstrate a squat pivot transfer with minimum assistance to maximize independence and mobility.    Baseline 10/22/19: unable to safely attempt at this time; 12/10/19: unable to safely attempt at this time, 01/14/20: unable to safety attempt at this time. 03/10/2020- Patient able to perform Stand pivot transfer with Maximal assist.    Time 12    Period Weeks    Status On-going    Target Date 04/07/20      PT LONG TERM GOAL #5   Title The patient will demonstrate sitting without UE support for 2-5 minutes at EOB to improve participation and maximize independence with ADLs.    Baseline 10/22/19: Pt able ot sit unsupported for at least 2 minutes at edge of bed    Time 12    Period Weeks    Status Achieved    Target Date 04/07/20                 Plan - 03/24/20 1429    Clinical Impression Statement Treatment shortened today due to patient presenting with undo fatigue after standing activities. She did however present with improved standing endurance and quality with less physical assistance today. She will would benefit from continued PT services to increase mobility and strength to improve patient's quality of life.    Personal Factors and Comorbidities Age;Time since onset of injury/illness/exacerbation;Comorbidity 3+;Fitness    Comorbidities HTN, quadriparesis, history of DVT, neurogenic bladder     Examination-Activity Limitations Bathing;Hygiene/Grooming;Squat;Bed Mobility;Lift;Bend;Stand;Engineer, manufacturing;Toileting;Self Feeding;Transfers;Continence;Sit;Dressing;Sleep;Carry    Examination-Participation Restrictions Church;Laundry;Cleaning;Medication Management;Community Activity;Meal Prep;Interpersonal Relationship    Stability/Clinical Decision Making Evolving/Moderate complexity    Rehab Potential Fair    PT Frequency 2x / week    PT Duration 12 weeks    PT Treatment/Interventions ADLs/Self Care Home Management;Electrical Stimulation;Therapeutic activities;Wheelchair mobility training;Vasopneumatic Device;Joint Manipulations;Vestibular;Passive range of motion;Patient/family education;Therapeutic exercise;DME Instruction;Biofeedback;Aquatic Therapy;Moist Heat;Gait training;Balance training;Orthotic Fit/Training;Dry needling;Energy conservation;Taping;Splinting;Neuromuscular re-education;Cryotherapy;Ultrasound;Functional mobility training    PT Next Visit Plan Continue with progressive Standing/transfer training.    Consulted and Agree with Plan of Care Patient           Patient will benefit from skilled therapeutic intervention in order to improve the following deficits and impairments:  Abnormal gait,Decreased balance,Decreased endurance,Decreased mobility,Difficulty walking,Hypomobility,Impaired sensation,Decreased range of motion,Improper body mechanics,Impaired perceived functional ability,Decreased activity tolerance,Decreased knowledge of use of DME,Decreased safety awareness,Decreased strength,Impaired flexibility,Impaired UE functional use,Postural dysfunction  Visit Diagnosis: Muscle weakness (generalized)  Other lack of coordination  Other abnormalities of gait and mobility     Problem List Patient Active Problem List   Diagnosis Date Noted  . Acute appendicitis with appendiceal abscess 05/30/2018  . Hypocalcemia 02/15/2018  . Dysuria 02/15/2018  .  Bilateral lower extremity edema 02/11/2018  . Vaginal yeast infection 01/30/2018  . Chest tightness 01/27/2018  . Healthcare-associated pneumonia 01/25/2018  . Pleural effusion, not elsewhere classified 01/25/2018  . Acute deep vein thrombosis (DVT) of left lower extremity ([place]) 01/24/2018  . Acute deep vein thrombosis (DVT) of right lower extremity ([place]) 01/24/2018  . Chronic allergic rhinitis 01/24/2018  . Depression with anxiety 01/24/2018  . UTI due to Klebsiella species 01/24/2018  . Protein-calorie malnutrition, severe ([place]) 01/24/2018  . S/P insertion of IVC (inferior vena caval) filter 01/24/2018  . Reactive depression   . Hypokalemia   . Leukocytosis   .  Essential hypertension   . Trauma   . Tetraparesis ([place])   . Neuropathic pain   . Neurogenic bowel   . Neurogenic bladder   . Benign essential HTN   . Acute blood loss anemia   . Central cord syndrome at C6 level of cervical spinal cord ([place]) 11/29/2017  . Central cord syndrome [facility]) 11/29/2017    Lewis Moccasin, PT 03/25/2020, 8:29 AM  [place] MAIN [facility] SERVICES [address] [place], [place], [address] Phone: [phone]   Fax:  [phone]  Name: Sherry [name] MRN: [id number] Date of Birth: [birth year]

## 2020-03-24 NOTE — Therapy (Signed)
[place] MA[facility] SERVICES [address] [place], [place], [address] Phone: [phone]   Fax:  [phone]  Occupational Therapy Treatment  Patient Details  Name: Sherry [name] MRN: [id number] Date of Birth: 01/08/1935 No data recorded  Encounter Date: 03/24/2020   OT End of Session - 03/24/20 1751    Visit Number 37    Number of Visits 12    Date for OT Re-Evaluation 05/21/20    Authorization Time Period Progress report period starting 02/27/2020    OT Start Time 1300    OT Stop Time 1345    OT Time Calculation (min) 45 min    Activity Tolerance Patient tolerated treatment well           Past Medical History:  Diagnosis Date  . Acute blood loss anemia   . Arthritis   . Cancer ([place])    skin  . Central cord syndrome at C6 level of cervical spinal cord ([place]) 11/29/2017  . Hypertension   . Protein-calorie malnutrition, severe ([place]) 01/24/2018  . S/P insertion of IVC (inferior vena caval) filter 01/24/2018  . Tetraparesis [facility])     Past Surgical History:  Procedure Laterality Date  . ANTERIOR CERVICAL DECOMP/DISCECTOMY FUSION N/A 11/29/2017   Procedure: Cervical five-six, six-seven Anterior Cervical Decompression Fusion;  Surgeon: Judith Part, MD;  Location: [place];  Service: Neurosurgery;  Laterality: N/A;  Cervical five-six, six-seven Anterior Cervical Decompression Fusion  . CATARACT EXTRACTION    . IR IVC FILTER PLMT / S&I /IMG GUID/MOD SED  12/08/2017    There were no vitals filed for this visit.   Subjective Assessment - 03/24/20 1750    Subjective  Pt. reports feeling okay today.    Patient is accompanied by: Family member    Pertinent History Patient s/p fall November 29, 2017 resulting in diagnosis of central cord syndrome at C 6 level.  She has had therapy in multiple venues but with recent move to [facility], therapy staff recommended she seek outpatient therapy for her needs.    Currently in Pain?  No/denies          OT Treatment  Pt.tolerated bilateral shoulder flexion, extension, abduction, elbow flexion, extension forearm supination AAROM/AROM. PROM bilateral wrist extension, digit MP, PIP, and DIP flexion, and extension in conjunction with moist heat.  Pt. worked on bilateral UE reaching with the right and Left UE using the horizontal [employer]. Pt. worked on reaching with incorporating her core to improve trunk control while reaching up, and sliding them to the right through 3 horizontal rungs.   Pt. was able to able to move the Sa[place]o rings through 3 horizontal rungs to the left, and 2 horizontal rungs to the right with bilateral hand coordination. Pt.continues to engageher right hand whenperforming self-feeding with adpativeequipment.Pt. reports that she is able to use her right thumb to hold items. Pt. tolerated ROM well without reports of pain, or discomfort. Pt.continues to benefit from working on improving ROM in order to work towards increasing engagement of her bilateral hands during ADLs, and IADL tasks.                      OT Education - 03/24/20 1751    Education Details BUE ROM with functional reaching    Person(s) Educated Patient;Caregiver(s)    Methods Explanation;Demonstration    Comprehension Verbalized understanding;Need further instruction               OT  Long Term Goals - 02/27/20 1357      OT LONG TERM GOAL #1   Title Patient and caregiver will demonstrate understanding of home exercise program for ROM.    Baseline Pt. has a restorative aide rehab aide assist her wih exercises at [facility]    Time 12    Period Weeks    Status On-going    Target Date 05/21/20      OT LONG TERM GOAL #2   Title Patient will demonstrate ability to don shirt with min assist from seated position.    Baseline Pt. is indepedent with a larger shirt, and requires increased time to complete    Time 12    Period Weeks    Status On-going     Target Date 05/21/20      OT LONG TERM GOAL #4   Title Patient will demonstrate improved composite finger flexion to hold deodorant to apply to underarms.    Baseline Pt. continues to present with improving finger flexion of R hand for a partial gross grasp, but unable to grasp and hold items. Very limited digit flexion of L hand. Pt. has active left thumb abduction.    Time 12    Period Weeks    Status On-going    Target Date 05/21/20      OT LONG TERM GOAL #6   Title Patient will increase right UE ROM to comb the back of her hair with modified independence.    Baseline Pt. continues to be able to comb the front, and sides of her head. Pt. can now reach the back, however cannot see what she is doing in the back of her head.    Time 12    Period Weeks    Status Partially Met    Target Date 05/21/20      OT LONG TERM GOAL #8   Title Pt. will write, and sign her name with 100% legibility, and modified independence.    Baseline Pt. is consistently filling out her daily menu, and writing for crossword puzzles. Pt. is able to maintain grasp on a wide width pen. [place] to work on Media [employer].    Time 12    Period Weeks    Status On-going    Target Date 05/21/20                 Plan - 03/24/20 1751    Clinical Impression Statement Pt. was able to able to move the Saebo rings through 3 horizontal rungs to the left, and 2 horizontal rungs to the right with bilateral hand coordination. Pt.continues to engageher right hand whenperforming self-feeding with adpativeequipment.Pt. reports that she is able to use her right thumb to hold items. Pt. tolerated ROM well without reports of pain, or discomfort. Pt.continues to benefit from working on improving ROM in order to work towards increasing engagement of her bilateral hands during ADLs, and IADL tasks.   OT Occupational Profile and History Detailed Assessment- Review of Records and additional review of physical, cognitive,  psychosocial history related to current functional performance    Occupational performance deficits (Please refer to evaluation for details): ADL's;IADL's;Leisure;Social Participation    Body Structure / Function / Physical Skills ADL;Continence;Dexterity;Flexibility;Strength;ROM;Balance;Coordination;FMC;IADL;Endurance;UE functional use;Decreased knowledge of use of DME;GMC    Psychosocial Skills Environmental  Adaptations;Habits;Routines and Behaviors    Rehab Potential Fair    Clinical Decision Making Limited treatment options, no task modification necessary    Comorbidities Affecting Occupational Performance: Presence of comorbidities impacting  occupational performance    Modification or Assistance to Complete Evaluation  No modification of tasks or assist necessary to complete eval    OT Frequency 2x / week    OT Duration 12 weeks    OT Treatment/Interventions Self-care/ADL training;Cryotherapy;Therapeutic exercise;DME and/or AE instruction;Balance training;Neuromuscular education;Manual Therapy;Splinting;Moist Heat;Passive range of motion;Therapeutic activities;Patient/family education    Consulted and Agree with Plan of Care Patient           Patient will benefit from skilled therapeutic intervention in order to improve the following deficits and impairments:   Body Structure / Function / Physical Skills: ADL,Continence,Dexterity,Flexibility,Strength,ROM,Balance,Coordination,FMC,IADL,Endurance,UE functional use,Decreased knowledge of use of [facility]   Psychosocial Skills: Environmental  Adaptations,Habits,Routines and Behaviors   Visit Diagnosis: Muscle weakness (generalized)  Other lack of coordination    Problem List Patient Active Problem List   Diagnosis Date Noted  . Acute appendicitis with appendiceal abscess 05/30/2018  . Hypocalcemia 02/15/2018  . Dysuria 02/15/2018  . Bilateral lower extremity edema 02/11/2018  . Vaginal yeast infection 01/30/2018  . Chest  tightness 01/27/2018  . Healthcare-associated pneumonia 01/25/2018  . Pleural effusion, not elsewhere classified 01/25/2018  . Acute deep vein thrombosis (DVT) of left lower extremity ([place]) 01/24/2018  . Acute deep vein thrombosis (DVT) of right lower extremity ([place]) 01/24/2018  . Chronic allergic rhinitis 01/24/2018  . Depression with anxiety 01/24/2018  . UTI due to Klebsiella species 01/24/2018  . Protein-calorie malnutrition, severe ([place]) 01/24/2018  . S/P insertion of IVC (inferior vena caval) filter 01/24/2018  . Reactive depression   . Hypokalemia   . Leukocytosis   . Essential hypertension   . Trauma   . Tetraparesis ([place])   . Neuropathic pain   . Neurogenic bowel   . Neurogenic bladder   . Benign essential HTN   . Acute blood loss anemia   . Central cord syndrome at C6 level of cervical spinal cord ([place]) 11/29/2017  . Central cord syndrome [facility]) 11/29/2017    Harrel Carina, MS, OTR/L 03/24/2020, 5:53 PM  [place] MAIN [facility] SERVICES [address] [place], [place], [address] Phone: [phone]   Fax:  [phone]  Name: Sherry [name] MRN: [id number] Date of Birth: 01/03/1935

## 2020-03-26 ENCOUNTER — Ambulatory Visit: Payer: Medicare HMO

## 2020-03-26 ENCOUNTER — Ambulatory Visit: Payer: Medicare HMO | Admitting: Occupational Therapy

## 2020-03-26 ENCOUNTER — Encounter: Payer: Self-pay | Admitting: Occupational Therapy

## 2020-03-26 ENCOUNTER — Other Ambulatory Visit: Payer: Self-pay

## 2020-03-26 DIAGNOSIS — R278 Other lack of coordination: Secondary | ICD-10-CM

## 2020-03-26 DIAGNOSIS — M6281 Muscle weakness (generalized): Secondary | ICD-10-CM

## 2020-03-26 NOTE — Therapy (Signed)
[place] MAIN [facility] SERVICES [address] [place], [place], [address] Phone: [phone]   Fax:  [phone]  Physical Therapy Treatment  Patient Details  Name: Sherry [name] MRN: [id number] Date of Birth: Jan 29, 1935 Referring Provider (PT): Viviana Simpler   Encounter Date: 03/26/2020   PT End of Session - 03/26/20 1349    Visit Number 45    Number of Visits 54    Date for PT Re-Evaluation 04/07/20    Authorization Type [employer]are FOTO performed by PT on eval (7/20), score 12, Progress note on 03/10/2020    PT Start Time 1348    PT Stop Time 1420    PT Time Calculation (min) 32 min    Equipment Utilized During Treatment Other (comment);Gait belt   lift   Activity Tolerance Patient tolerated treatment well;No increased pain;Patient limited by fatigue    Behavior During Therapy [facility] for tasks assessed/performed           Past Medical History:  Diagnosis Date  . Acute blood loss anemia   . Arthritis   . Cancer ([place])    skin  . Central cord syndrome at C6 level of cervical spinal cord ([place]) 11/29/2017  . Hypertension   . Protein-calorie malnutrition, severe ([place]) 01/24/2018  . S/P insertion of IVC (inferior vena caval) filter 01/24/2018  . Tetraparesis [facility]     Past Surgical History:  Procedure Laterality Date  . ANTERIOR CERVICAL DECOMP/DISCECTOMY FUSION N/A 11/29/2017   Procedure: Cervical five-six, six-seven Anterior Cervical Decompression Fusion;  Surgeon: Judith Part, MD;  Location: [place];  Service: Neurosurgery;  Laterality: N/A;  Cervical five-six, six-seven Anterior Cervical Decompression Fusion  . CATARACT EXTRACTION    . IR IVC FILTER PLMT / S&I /IMG GUID/MOD SED  12/08/2017    There were no vitals filed for this visit.   Subjective Assessment - 03/26/20 1348    Subjective Patient reports doing well today and states no new issues. Agreeable to standing activities today.    Patient is accompained by: --    [name] (aide)   Pertinent History Pt is an 86 yo female that fell in 2019, fractured vertebrae in her neck and in her low back per family. 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 After hospital stay, pt discharged to [facility] for ~1 month, experienced severe UTI as well as bilateral DVT (IVC filter placed). Discharged to [facility], stayed for about a year, and then moved to [facility] in April 2021. Was receiving PT, but per family facility reported that did not have adequate equipment to maximize PT for her. Pt until about 1 month ago was practicing sit to stand transfers with 1-2 people, and working on static standing. Has a brace for L foot due to PF. Pt currently needs assistance with all ADLs (able to assist with donning/doffing her shirt), bed baths, and needs a hoyer lift for transfers. Able to drive power wheelchair without assistance.    Limitations Standing;Walking;House hold activities;Lifting    How long can you sit comfortably? NA    How long can you stand comfortably? unable    How long can you walk comfortably? unable    Patient Stated Goals "to stand up and walk"    Currently in Pain? No/denies    Pain Onset 1 to 4 weeks ago            Neuromuscular re-education:  Patient performed 4 trials of static standing in //bars-  1) 1 min  50 sec with Max A sit to stand stand and position hips/trunk forward - once she was centered she was able to engage core/glutes/UE support with verbal and tactile cues. Patient with reminders to look ahead and keep glutes engaged. She presents with increased knee valgus with RLE and RLE varus ankle.  2) 1 min 30 sec -   3)  1 min 15 sec- attempted step back with left LE yet patient unable today.  4) 1 min 20 sec -    Pt educated throughout session about proper posture and technique with exercises. Improved exercise technique, movement at target joints, use of target muscles after min to mod verbal, visual, tactile cues.  Overall  patient required less overall cues with standing posture today.                              PT Education - 03/26/20 1704    Education Details postural cues with scooting, transfers and standing.    Person(s) Educated Patient    Methods Explanation;Demonstration;Tactile cues;Verbal cues    Comprehension Verbalized understanding;Need further instruction;Returned demonstration;Verbal cues required            PT Short Term Goals - 03/10/20 2211      PT SHORT TERM GOAL #1   Title The patient will perform initial HEP with minimum assistance in order to improve strength and function.    Baseline Patient demonstrating independence with initial HEP as of 03/10/2020 with no questions or difficulty.    Time 4    Period Weeks    Status Achieved    Target Date 02/11/20             PT Long Term Goals - 03/10/20 2213      PT LONG TERM GOAL #1   Title The patient will be compliant with finalized HEP with minimum assistance in preparation for self management and maintenance of condition.    Baseline 03/10/2020- Patient familiar with initial HEP and will keep goal active as HEP becomes more progressive including possible transfers and standing activities.    Time 12    Period Weeks    Status On-going    Target Date 04/07/20      PT LONG TERM GOAL #2   Title The patient will demonstrate at least 10 point improvement on FOTO score indicating an improved ability to perform functional activities.    Baseline on eval (/720) score 12; 10/22/19: 12; 12/10/19: 18, 12/13: 18    Time 12    Period Weeks    Status On-going    Target Date 04/07/20      PT LONG TERM GOAL #3   Title The patient will demonstrate lateral scooting  for assistance with transfers with minA to maximize independence.    Baseline 10/22/19: Pt requires modA+1 for lateral scooting; 12/10/19: modA+1, 12/13: modA-maxA, 12/27: modA-maxA, 03/10/2020- ModA-MaxA- increased verbal cues and visual demonstration.     Time 12    Period Weeks    Status On-going    Target Date 04/07/20      PT LONG TERM GOAL #4   Title The patient will demonstrate a squat pivot transfer with minimum assistance to maximize independence and mobility.    Baseline 10/22/19: unable to safely attempt at this time; 12/10/19: unable to safely attempt at this time, 01/14/20: unable to safety attempt at this time. 03/10/2020- Patient able to perform Stand pivot transfer with Maximal assist.  Time 12    Period Weeks    Status On-going    Target Date 04/07/20      PT LONG TERM GOAL #5   Title The patient will demonstrate sitting without UE support for 2-5 minutes at EOB to improve participation and maximize independence with ADLs.    Baseline 10/22/19: Pt able ot sit unsupported for at least 2 minutes at edge of bed    Time 12    Period Weeks    Status Achieved    Target Date 04/07/20                 Plan - 03/26/20 1426    Clinical Impression Statement Patient presented with much improved overall standing endurance and quality today. She required less verbal and tactile cues with standing. She does present with increased RLE knee valgus and ankle varus with prolonged standing but no reported pain. She will would benefit from continued PT services to increase mobility and strength to improve patient's quality of life.    Personal Factors and Comorbidities Age;Time since onset of injury/illness/exacerbation;Comorbidity 3+;Fitness    Comorbidities HTN, quadriparesis, history of DVT, neurogenic bladder    Examination-Activity Limitations Bathing;Hygiene/Grooming;Squat;Bed Mobility;Lift;Bend;Stand;Engineer, manufacturing;Toileting;Self Feeding;Transfers;Continence;Sit;Dressing;Sleep;Carry    Examination-Participation Restrictions Church;Laundry;Cleaning;Medication Management;Community Activity;Meal Prep;Interpersonal Relationship    Stability/Clinical Decision Making Evolving/Moderate complexity    Rehab Potential  Fair    PT Frequency 2x / week    PT Duration 12 weeks    PT Treatment/Interventions ADLs/Self Care Home Management;Electrical Stimulation;Therapeutic activities;Wheelchair mobility training;Vasopneumatic Device;Joint Manipulations;Vestibular;Passive range of motion;Patient/family education;Therapeutic exercise;DME Instruction;Biofeedback;Aquatic Therapy;Moist Heat;Gait training;Balance training;Orthotic Fit/Training;Dry needling;Energy conservation;Taping;Splinting;Neuromuscular re-education;Cryotherapy;Ultrasound;Functional mobility training    PT Next Visit Plan Continue with progressive Standing/transfer training.    Consulted and Agree with Plan of Care Patient           Patient will benefit from skilled therapeutic intervention in order to improve the following deficits and impairments:  Abnormal gait,Decreased balance,Decreased endurance,Decreased mobility,Difficulty walking,Hypomobility,Impaired sensation,Decreased range of motion,Improper body mechanics,Impaired perceived functional ability,Decreased activity tolerance,Decreased knowledge of use of DME,Decreased safety awareness,Decreased strength,Impaired flexibility,Impaired UE functional use,Postural dysfunction  Visit Diagnosis: Muscle weakness (generalized)  Other lack of coordination     Problem List Patient Active Problem List   Diagnosis Date Noted  . Acute appendicitis with appendiceal abscess 05/30/2018  . Hypocalcemia 02/15/2018  . Dysuria 02/15/2018  . Bilateral lower extremity edema 02/11/2018  . Vaginal yeast infection 01/30/2018  . Chest tightness 01/27/2018  . Healthcare-associated pneumonia 01/25/2018  . Pleural effusion, not elsewhere classified 01/25/2018  . Acute deep vein thrombosis (DVT) of left lower extremity ([place]) 01/24/2018  . Acute deep vein thrombosis (DVT) of right lower extremity ([place]) 01/24/2018  . Chronic allergic rhinitis 01/24/2018  . Depression with anxiety 01/24/2018  . UTI due to  Klebsiella species 01/24/2018  . Protein-calorie malnutrition, severe ([place]) 01/24/2018  . S/P insertion of IVC (inferior vena caval) filter 01/24/2018  . Reactive depression   . Hypokalemia   . Leukocytosis   . Essential hypertension   . Trauma   . Tetraparesis ([place])   . Neuropathic pain   . Neurogenic bowel   . Neurogenic bladder   . Benign essential HTN   . Acute blood loss anemia   . Central cord syndrome at C6 level of cervical spinal cord ([place]) 11/29/2017  . Central cord syndrome [facility]) 11/29/2017    Lewis Moccasin, PT 03/26/2020, 5:09 PM  [place] MAIN [facility] SERVICES [place], [place],  [place] Phone: [phone]   Fax:  [phone]  Name: Sherry [name] MRN: [id number] Date of Birth: [birth year]

## 2020-03-26 NOTE — Therapy (Signed)
[place] MAIN [facility] SERVICES [address] [place], [place], [address] Phone: [phone]   Fax:  [phone]  Occupational Therapy Treatment  Patient Details  Name: Sherry [name] MRN: [id number] Date of Birth: Jun 02, 1934 No data recorded  Encounter Date: 03/26/2020   OT End of Session - 03/26/20 1424    Visit Number 38    Number of Visits 28    Date for OT Re-Evaluation 05/21/20    Authorization Time Period Progress report period starting 02/27/2020    OT Start Time 1300    OT Stop Time 1345    OT Time Calculation (min) 45 min    Activity Tolerance Patient tolerated treatment well    Behavior During Therapy [facility] for tasks assessed/performed           Past Medical History:  Diagnosis Date  . Acute blood loss anemia   . Arthritis   . Cancer ([place])    skin  . Central cord syndrome at C6 level of cervical spinal cord ([place]) 11/29/2017  . Hypertension   . Protein-calorie malnutrition, severe ([place]) 01/24/2018  . S/P insertion of IVC (inferior vena caval) filter 01/24/2018  . Tetraparesis [facility])     Past Surgical History:  Procedure Laterality Date  . ANTERIOR CERVICAL DECOMP/DISCECTOMY FUSION N/A 11/29/2017   Procedure: Cervical five-six, six-seven Anterior Cervical Decompression Fusion;  Surgeon: Judith Part, MD;  Location: [place];  Service: Neurosurgery;  Laterality: N/A;  Cervical five-six, six-seven Anterior Cervical Decompression Fusion  . CATARACT EXTRACTION    . IR IVC FILTER PLMT / S&I /IMG GUID/MOD SED  12/08/2017    There were no vitals filed for this visit.   Subjective Assessment - 03/26/20 1423    Subjective  Pt. reports feeling okay today.    Patient is accompanied by: Family member    Pertinent History Patient s/p fall November 29, 2017 resulting in diagnosis of central cord syndrome at C 6 level.  She has had therapy in multiple venues but with recent move to [facility], therapy staff recommended she seek  outpatient therapy for her needs.    Patient Stated Goals Patient would like to be able to move better in bed, stand and perform self care tasks.    Currently in Pain? No/denies           OT Treatment  Pt.tolerated bilateral shoulder flexion, extension, abduction, elbow flexion, extension forearm supination AAROM/AROM. PROM bilateral wrist extension, digit MP, PIP, and DIP flexion, and extension in conjunction with moist heat.Pt. worked on bilateral UE reaching with the right and Left UE using the horizontal [employer]. Pt. worked on reaching with incorporating her core to improve trunk control while reaching up, and sliding them to the right through 3 horizontal rungs.  Pt. was able to able to move the [place] rings through 3 horizontal rungs to the left, and the right with bilateral hand coordination. Pt.continues to engageher right hand whenperforming self-feeding with adpativeequipment.Pt. reports that she is able to use her right thumb to hold items. Pt. tolerated ROM well without reports of pain, or discomfort. Pt.continues to benefit from working on improving ROM in order to work towards increasing engagement of her bilateral hands during ADLs, and IADL tasks.                        OT Education - 03/26/20 1424    Education Details BUE ROM with functional reaching    Person(s) Educated  Patient;Caregiver(s)    Methods Explanation;Demonstration    Comprehension Verbalized understanding;Need further instruction               OT Long Term Goals - 02/27/20 1357      OT LONG TERM GOAL #1   Title Patient and caregiver will demonstrate understanding of home exercise program for ROM.    Baseline Pt. has a restorative aide rehab aide assist her wih exercises at [facility]    Time 12    Period Weeks    Status On-going    Target Date 05/21/20      OT LONG TERM GOAL #2   Title Patient will demonstrate ability to don shirt with min assist from seated  position.    Baseline Pt. is indepedent with a larger shirt, and requires increased time to complete    Time 12    Period Weeks    Status On-going    Target Date 05/21/20      OT LONG TERM GOAL #4   Title Patient will demonstrate improved composite finger flexion to hold deodorant to apply to underarms.    Baseline Pt. continues to present with improving finger flexion of R hand for a partial gross grasp, but unable to grasp and hold items. Very limited digit flexion of L hand. Pt. has active left thumb abduction.    Time 12    Period Weeks    Status On-going    Target Date 05/21/20      OT LONG TERM GOAL #6   Title Patient will increase right UE ROM to comb the back of her hair with modified independence.    Baseline Pt. continues to be able to comb the front, and sides of her head. Pt. can now reach the back, however cannot see what she is doing in the back of her head.    Time 12    Period Weeks    Status Partially Met    Target Date 05/21/20      OT LONG TERM GOAL #8   Title Pt. will write, and sign her name with 100% legibility, and modified independence.    Baseline Pt. is consistently filling out her daily menu, and writing for crossword puzzles. Pt. is able to maintain grasp on a wide width pen. [place] to work on Media planner.    Time 12    Period Weeks    Status On-going    Target Date 05/21/20                 Plan - 03/26/20 1424    Clinical Impression Statement Pt. was able to able to move the [employer] rings through 3 horizontal rungs to the left, and the right with bilateral hand coordination. Pt.continues to engageher right hand whenperforming self-feeding with adpativeequipment.Pt. reports that she is able to use her right thumb to hold items. Pt. tolerated ROM well without reports of pain, or discomfort. Pt.continues to benefit from working on improving ROM in order to work towards increasing engagement of her bilateral hands during ADLs, and IADL  tasks.   OT Occupational Profile and History Detailed Assessment- Review of Records and additional review of physical, cognitive, psychosocial history related to current functional performance    Occupational performance deficits (Please refer to evaluation for details): ADL's;IADL's;Leisure;Social Participation    Body Structure / Function / Physical Skills ADL;Continence;Dexterity;Flexibility;Strength;ROM;Balance;Coordination;FMC;IADL;Endurance;UE functional use;Decreased knowledge of use of DME;GMC    Psychosocial Skills Environmental  Adaptations;Habits;Routines and Behaviors    Rehab Potential  Fair    Clinical Decision Making Limited treatment options, no task modification necessary    Comorbidities Affecting Occupational Performance: Presence of comorbidities impacting occupational performance    Modification or Assistance to Complete Evaluation  No modification of tasks or assist necessary to complete eval    OT Frequency 2x / week    OT Duration 12 weeks    OT Treatment/Interventions Self-care/ADL training;Cryotherapy;Therapeutic exercise;DME and/or AE instruction;Balance training;Neuromuscular education;Manual Therapy;Splinting;Moist Heat;Passive range of motion;Therapeutic activities;Patient/family education    Consulted and Agree with Plan of Care Patient           Patient will benefit from skilled therapeutic intervention in order to improve the following deficits and impairments:   Body Structure / Function / Physical Skills: ADL,Continence,Dexterity,Flexibility,Strength,ROM,Balance,Coordination,FMC,IADL,Endurance,UE functional use,Decreased knowledge of use of [facility]   Psychosocial Skills: Environmental  Adaptations,Habits,Routines and Behaviors   Visit Diagnosis: Muscle weakness (generalized)  Other lack of coordination    Problem List Patient Active Problem List   Diagnosis Date Noted  . Acute appendicitis with appendiceal abscess 05/30/2018  . Hypocalcemia  02/15/2018  . Dysuria 02/15/2018  . Bilateral lower extremity edema 02/11/2018  . Vaginal yeast infection 01/30/2018  . Chest tightness 01/27/2018  . Healthcare-associated pneumonia 01/25/2018  . Pleural effusion, not elsewhere classified 01/25/2018  . Acute deep vein thrombosis (DVT) of left lower extremity ([place]) 01/24/2018  . Acute deep vein thrombosis (DVT) of right lower extremity ([place]) 01/24/2018  . Chronic allergic rhinitis 01/24/2018  . Depression with anxiety 01/24/2018  . UTI due to Klebsiella species 01/24/2018  . Protein-calorie malnutrition, severe ([place]) 01/24/2018  . S/P insertion of IVC (inferior vena caval) filter 01/24/2018  . Reactive depression   . Hypokalemia   . Leukocytosis   . Essential hypertension   . Trauma   . Tetraparesis ([place])   . Neuropathic pain   . Neurogenic bowel   . Neurogenic bladder   . Benign essential HTN   . Acute blood loss anemia   . Central cord syndrome at C6 level of cervical spinal cord ([place]) 11/29/2017  . Central cord syndrome [facility]) 11/29/2017    Harrel Carina, MS, OTR/L 03/26/2020, 2:25 PM  [place] MAIN [facility] SERVICES [address] [place], [place], [address] Phone: [phone]   Fax:  [phone]  Name: REI CONTEE MRN: [id number] Date of Birth: 1934-12-24

## 2020-03-31 ENCOUNTER — Encounter: Payer: Self-pay | Admitting: Occupational Therapy

## 2020-03-31 ENCOUNTER — Other Ambulatory Visit: Payer: Self-pay

## 2020-03-31 ENCOUNTER — Ambulatory Visit: Payer: Medicare HMO | Admitting: Occupational Therapy

## 2020-03-31 DIAGNOSIS — M6281 Muscle weakness (generalized): Secondary | ICD-10-CM | POA: Diagnosis not present

## 2020-03-31 DIAGNOSIS — S14129S Central cord syndrome at unspecified level of cervical spinal cord, sequela: Secondary | ICD-10-CM

## 2020-03-31 DIAGNOSIS — R278 Other lack of coordination: Secondary | ICD-10-CM

## 2020-03-31 NOTE — Therapy (Signed)
[place] MAIN [facility] SERVICES [address] [place], [place], [address] Phone: [phone]   Fax:  [phone]  Occupational Therapy Treatment  Patient Details  Name: Sherry [name] MRN: [id number] Date of Birth: 06-01-1934 No data recorded  Encounter Date: 03/31/2020   OT End of Session - 03/31/20 1417    Visit Number 39    Number of Visits 40    Date for OT Re-Evaluation 05/21/20    Authorization Time Period Progress report period starting 02/27/2020    OT Start Time 1300    OT Stop Time 1345    OT Time Calculation (min) 45 min    Activity Tolerance Patient tolerated treatment well    Behavior During Therapy [facility] for tasks assessed/performed           Past Medical History:  Diagnosis Date  . Acute blood loss anemia   . Arthritis   . Cancer ([place])    skin  . Central cord syndrome at C6 level of cervical spinal cord ([place]) 11/29/2017  . Hypertension   . Protein-calorie malnutrition, severe ([place]) 01/24/2018  . S/P insertion of IVC (inferior vena caval) filter 01/24/2018  . Tetraparesis [facility])     Past Surgical History:  Procedure Laterality Date  . ANTERIOR CERVICAL DECOMP/DISCECTOMY FUSION N/A 11/29/2017   Procedure: Cervical five-six, six-seven Anterior Cervical Decompression Fusion;  Surgeon: Judith Part, MD;  Location: [place];  Service: Neurosurgery;  Laterality: N/A;  Cervical five-six, six-seven Anterior Cervical Decompression Fusion  . CATARACT EXTRACTION    . IR IVC FILTER PLMT / S&I /IMG GUID/MOD SED  12/08/2017    There were no vitals filed for this visit.   Subjective Assessment - 03/31/20 1416    Subjective  Pt. reports feeling well today, has been enjoying puzzles with her husband.    Patient is accompanied by: Family member    Pertinent History Patient s/p fall November 29, 2017 resulting in diagnosis of central cord syndrome at C 6 level.  She has had therapy in multiple venues but with recent move to [facility], therapy staff recommended she seek outpatient therapy for her needs.    Patient Stated Goals Patient would like to be able to move better in bed, stand and perform self care tasks.    Currently in Pain? No/denies            Pt tolerated bilateral shoulder flexion, extension, abduction, elbow flexion, extension forearm supination AAROM/AROM. PROM bilateral wrist extension, digit MP, PIP, and DIP flexion, and extension in conjunction with moist heat. Pt completed 1 set x 20 reps each B digit adduction/abduction and thumb opposition.          OT Education - 03/31/20 1416    Education Details BUE ROM with functional reaching    Person(s) Educated Patient;Caregiver(s)    Methods Explanation;Demonstration    Comprehension Verbalized understanding;Need further instruction               OT Long Term Goals - 02/27/20 1357      OT LONG TERM GOAL #1   Title Patient and caregiver will demonstrate understanding of home exercise program for ROM.    Baseline Pt. has a restorative aide rehab aide assist her wih exercises at [facility]    Time 12    Period Weeks    Status On-going    Target Date 05/21/20      OT LONG TERM GOAL #2   Title Patient will demonstrate ability  to don shirt with min assist from seated position.    Baseline Pt. is indepedent with a larger shirt, and requires increased time to complete    Time 12    Period Weeks    Status On-going    Target Date 05/21/20      OT LONG TERM GOAL #4   Title Patient will demonstrate improved composite finger flexion to hold deodorant to apply to underarms.    Baseline Pt. continues to present with improving finger flexion of R hand for a partial gross grasp, but unable to grasp and hold items. Very limited digit flexion of L hand. Pt. has active left thumb abduction.    Time 12    Period Weeks    Status On-going    Target Date 05/21/20      OT LONG TERM GOAL #6   Title Patient will increase right UE ROM to comb the  back of her hair with modified independence.    Baseline Pt. continues to be able to comb the front, and sides of her head. Pt. can now reach the back, however cannot see what she is doing in the back of her head.    Time 12    Period Weeks    Status Partially Met    Target Date 05/21/20      OT LONG TERM GOAL #8   Title Pt. will write, and sign her name with 100% legibility, and modified independence.    Baseline Pt. is consistently filling out her daily menu, and writing for crossword puzzles. Pt. is able to maintain grasp on a wide width pen. [place] to work on Media planner.    Time 12    Period Weeks    Status On-going    Target Date 05/21/20                 Plan - 03/31/20 1417    Clinical Impression Statement Pt reports using BUEmostly for self-feeding, reports washing well between digits. Demonstrated improved tolerance for AAROM exercises this date. Pt. tolerated ROM well without reports of pain, or discomfort. Pt. continues to benefit from working on improving ROM in order to work towards increasing engagement of her bilateral hands during ADLs, and IADL tasks.    OT Occupational Profile and History Detailed Assessment- Review of Records and additional review of physical, cognitive, psychosocial history related to current functional performance    Occupational performance deficits (Please refer to evaluation for details): ADL's;IADL's;Leisure;Social Participation    Body Structure / Function / Physical Skills ADL;Continence;Dexterity;Flexibility;Strength;ROM;Balance;Coordination;FMC;IADL;Endurance;UE functional use;Decreased knowledge of use of DME;GMC    Psychosocial Skills Environmental  Adaptations;Habits;Routines and Behaviors    Rehab Potential Fair    Clinical Decision Making Limited treatment options, no task modification necessary    Comorbidities Affecting Occupational Performance: Presence of comorbidities impacting occupational performance    Modification  or Assistance to Complete Evaluation  No modification of tasks or assist necessary to complete eval    OT Frequency 2x / week    OT Duration 12 weeks    OT Treatment/Interventions Self-care/ADL training;Cryotherapy;Therapeutic exercise;DME and/or AE instruction;Balance training;Neuromuscular education;Manual Therapy;Splinting;Moist Heat;Passive range of motion;Therapeutic activities;Patient/family education    Consulted and Agree with Plan of Care Patient           Patient will benefit from skilled therapeutic intervention in order to improve the following deficits and impairments:   Body Structure / Function / Physical Skills: ADL,Continence,Dexterity,Flexibility,Strength,ROM,Balance,Coordination,FMC,IADL,Endurance,UE functional use,Decreased knowledge of use of [facility]   Psychosocial Skills: Environmental  Adaptations,Habits,Routines and Behaviors   Visit Diagnosis: Other lack of coordination  Central cord syndrome, sequela ([place])    Problem List Patient Active Problem List   Diagnosis Date Noted  . Acute appendicitis with appendiceal abscess 05/30/2018  . Hypocalcemia 02/15/2018  . Dysuria 02/15/2018  . Bilateral lower extremity edema 02/11/2018  . Vaginal yeast infection 01/30/2018  . Chest tightness 01/27/2018  . Healthcare-associated pneumonia 01/25/2018  . Pleural effusion, not elsewhere classified 01/25/2018  . Acute deep vein thrombosis (DVT) of left lower extremity ([place]) 01/24/2018  . Acute deep vein thrombosis (DVT) of right lower extremity ([place]) 01/24/2018  . Chronic allergic rhinitis 01/24/2018  . Depression with anxiety 01/24/2018  . UTI due to Klebsiella species 01/24/2018  . Protein-calorie malnutrition, severe ([place]) 01/24/2018  . S/P insertion of IVC (inferior vena caval) filter 01/24/2018  . Reactive depression   . Hypokalemia   . Leukocytosis   . Essential hypertension   . Trauma   . Tetraparesis ([place])   . Neuropathic pain   . Neurogenic bowel   .  Neurogenic bladder   . Benign essential HTN   . Acute blood loss anemia   . Central cord syndrome at C6 level of cervical spinal cord ([place]) 11/29/2017  . Central cord syndrome ([place]) 11/29/2017   Dessie Coma, M.S. OTR/L  03/31/20, 2:34 PM  ascom [phone]  [place] MAIN [facility] SERVICES [address] [place], [place], [address] Phone: [phone]   Fax:  [phone]  Name: Sherry [name] MRN: [id number] Date of Birth: 07/30/1934

## 2020-04-02 ENCOUNTER — Ambulatory Visit: Payer: Medicare HMO | Attending: Internal Medicine | Admitting: Occupational Therapy

## 2020-04-02 ENCOUNTER — Other Ambulatory Visit: Payer: Self-pay

## 2020-04-02 ENCOUNTER — Encounter: Payer: Self-pay | Admitting: Occupational Therapy

## 2020-04-02 DIAGNOSIS — R278 Other lack of coordination: Secondary | ICD-10-CM | POA: Diagnosis present

## 2020-04-02 DIAGNOSIS — M6281 Muscle weakness (generalized): Secondary | ICD-10-CM | POA: Insufficient documentation

## 2020-04-02 DIAGNOSIS — R2689 Other abnormalities of gait and mobility: Secondary | ICD-10-CM | POA: Insufficient documentation

## 2020-04-02 DIAGNOSIS — S14129S Central cord syndrome at unspecified level of cervical spinal cord, sequela: Secondary | ICD-10-CM | POA: Diagnosis present

## 2020-04-02 NOTE — Therapy (Signed)
[place] MAIN [facility] SERVICES [address] [place], [place], [address] Phone: [phone]   Fax:  [phone]  Occupational Therapy Progress Note  Dates of reporting period  02/27/2020   to   04/02/2020  Patient Details  Name: Sherry [name] MRN: [id number] Date of Birth: 1935-01-05 No data recorded  Encounter Date: 04/02/2020   OT End of Session - 04/02/20 1548    Visit Number 40    Number of Visits 71    Date for OT Re-Evaluation 05/21/20    Authorization Time Period Progress report period starting 04/02/2020    OT Start Time 1300    OT Stop Time 1345    OT Time Calculation (min) 45 min    Activity Tolerance Patient tolerated treatment well    Behavior During Therapy [facility] for tasks assessed/performed           Past Medical History:  Diagnosis Date  . Acute blood loss anemia   . Arthritis   . Cancer ([place])    skin  . Central cord syndrome at C6 level of cervical spinal cord ([place]) 11/29/2017  . Hypertension   . Protein-calorie malnutrition, severe ([place]) 01/24/2018  . S/P insertion of IVC (inferior vena caval) filter 01/24/2018  . Tetraparesis [facility])     Past Surgical History:  Procedure Laterality Date  . ANTERIOR CERVICAL DECOMP/DISCECTOMY FUSION N/A 11/29/2017   Procedure: Cervical five-six, six-seven Anterior Cervical Decompression Fusion;  Surgeon: Judith Part, MD;  Location: [place];  Service: Neurosurgery;  Laterality: N/A;  Cervical five-six, six-seven Anterior Cervical Decompression Fusion  . CATARACT EXTRACTION    . IR IVC FILTER PLMT / S&I /IMG GUID/MOD SED  12/08/2017    There were no vitals filed for this visit.   Subjective Assessment - 04/02/20 1547    Subjective  Pt. reports feeling well today, and went a [employer] celebration yesterday.    Patient is accompanied by: Family member    Pertinent History Patient s/p fall November 29, 2017 resulting in diagnosis of central cord syndrome at C 6 level.  She has  had therapy in multiple venues but with recent move to [facility], therapy staff recommended she seek outpatient therapy for her needs.    Currently in Pain? No/denies           OT Treatment  Pt.tolerated bilateral shoulder flexion, extension, abduction, elbow flexion, extension forearm supination AAROM/AROM. PROM bilateral wrist extension, digit MP, PIP, and DIP flexion, and extension in conjunction with moist heat.  Pt. Is making progress overall with improving BUE ROM. Pt. continues to progress towards formulating a fist. Pt. Is improving with writing, and improving with legibility.  Pt.continues to engageher right hand whenperforming self-feeding with adpativeequipment, and is improving with UE dressing tasks.Pt. Continues to be able to use her right thumb to hold items. Pt. tolerated ROM well without reports of pain, or discomfort. Pt.continues to benefit from working on improving ROM in order to work towards increasing engagement of her bilateral hands during ADLs, and IADL tasks.                       OT Education - 04/02/20 1548    Education Details BUE ROM with functional reaching    Person(s) Educated Patient;Caregiver(s)    Methods Explanation;Demonstration    Comprehension Verbalized understanding;Need further instruction               OT Long Term Goals - 04/02/20 1549  OT LONG TERM GOAL #1   Title Patient and caregiver will demonstrate understanding of home exercise program for ROM.    Baseline Pt. continues to have a restorative aide rehab aide assist her wih exercises at [facility]    Time 12    Period Weeks    Status On-going    Target Date 05/21/20      OT LONG TERM GOAL #2   Title Patient will demonstrate ability to don shirt with min assist from seated position.    Baseline Pt. is indepedent with a larger shirt, and continues to require increased time to complete    Time 12    Period Weeks    Status On-going    Target  Date 05/21/20      OT LONG TERM GOAL #4   Title Patient will demonstrate improved composite finger flexion to hold deodorant to apply to underarms.    Baseline Pt. continues to present with improving finger flexion of R hand for a partial gross grasp, but unable to grasp and hold items. Very limited digit flexion of L hand. Pt. has active left thumb abduction.    Time 12    Period Weeks    Status On-going    Target Date 05/21/20      OT LONG TERM GOAL #6   Title Patient will increase right UE ROM to comb the back of her hair with modified independence.    Baseline Pt. continues to be able to comb the front, and sides of her head. Pt. can now reach the back, however cannot see what she is doing in the back of her head.    Time 12    Period Weeks    Status Partially Met    Target Date 05/21/20      OT LONG TERM GOAL #8   Title Pt. will write, and sign her name with 100% legibility, and modified independence.    Baseline Pt. conitnues to  consistently filling out her daily menu, and writing for crossword puzzles. Pt. is able to maintain grasp on a wide width pen. [place] to work on Media [employer].    Time 12    Period Weeks    Status On-going    Target Date 05/21/20                 Plan - 04/02/20 1548    Clinical Impression Statement Pt. Is making progress overall with improving BUE ROM. Pt. continues to progress towards formulating a fist. Pt. Is improving with writing, and improving with legibility.  Pt.continues to engageher right hand whenperforming self-feeding with adpativeequipment, and is improving with UE dressing tasks.Pt. Continues to be able to use her right thumb to hold items. Pt. tolerated ROM well without reports of pain, or discomfort. Pt.continues to benefit from working on improving ROM in order to work towards increasing engagement of her bilateral hands during ADLs, and IADL tasks.   OT Occupational Profile and History Detailed Assessment- Review  of Records and additional review of physical, cognitive, psychosocial history related to current functional performance    Occupational performance deficits (Please refer to evaluation for details): ADL's;IADL's;Leisure;Social Participation    Body Structure / Function / Physical Skills ADL;Continence;Dexterity;Flexibility;Strength;ROM;Balance;Coordination;FMC;IADL;Endurance;UE functional use;Decreased knowledge of use of DME;GMC    Psychosocial Skills Environmental  Adaptations;Habits;Routines and Behaviors    Rehab Potential Fair    Clinical Decision Making Limited treatment options, no task modification necessary    Comorbidities Affecting Occupational Performance: Presence of comorbidities impacting  occupational performance    Comorbidities impacting occupational performance description: contractures of bilateral hands, dependent transfers,    Modification or Assistance to Complete Evaluation  No modification of tasks or assist necessary to complete eval    OT Frequency 2x / week    OT Duration 12 weeks    OT Treatment/Interventions Self-care/ADL training;Cryotherapy;Therapeutic exercise;DME and/or AE instruction;Balance training;Neuromuscular education;Manual Therapy;Splinting;Moist Heat;Passive range of motion;Therapeutic activities;Patient/family education    Plan continue to progress ROM of digits, wrists and shoulders as it pertains to completion of ADL tasks that pt values.    Consulted and Agree with Plan of Care Patient           Patient will benefit from skilled therapeutic intervention in order to improve the following deficits and impairments:   Body Structure / Function / Physical Skills: ADL,Continence,Dexterity,Flexibility,Strength,ROM,Balance,Coordination,FMC,IADL,Endurance,UE functional use,Decreased knowledge of use of [facility]   Psychosocial Skills: Environmental  Adaptations,Habits,Routines and Behaviors   Visit Diagnosis: Muscle weakness (generalized)  Other lack of  coordination    Problem List Patient Active Problem List   Diagnosis Date Noted  . Acute appendicitis with appendiceal abscess 05/30/2018  . Hypocalcemia 02/15/2018  . Dysuria 02/15/2018  . Bilateral lower extremity edema 02/11/2018  . Vaginal yeast infection 01/30/2018  . Chest tightness 01/27/2018  . Healthcare-associated pneumonia 01/25/2018  . Pleural effusion, not elsewhere classified 01/25/2018  . Acute deep vein thrombosis (DVT) of left lower extremity ([place]) 01/24/2018  . Acute deep vein thrombosis (DVT) of right lower extremity ([place]) 01/24/2018  . Chronic allergic rhinitis 01/24/2018  . Depression with anxiety 01/24/2018  . UTI due to Klebsiella species 01/24/2018  . Protein-calorie malnutrition, severe ([place]) 01/24/2018  . S/P insertion of IVC (inferior vena caval) filter 01/24/2018  . Reactive depression   . Hypokalemia   . Leukocytosis   . Essential hypertension   . Trauma   . Tetraparesis ([place])   . Neuropathic pain   . Neurogenic bowel   . Neurogenic bladder   . Benign essential HTN   . Acute blood loss anemia   . Central cord syndrome at C6 level of cervical spinal cord ([place]) 11/29/2017  . Central cord syndrome [facility]) 11/29/2017    Harrel Carina, MS, OTR/L 04/02/2020, 5:45 PM  [place] MAIN [facility] SERVICES [address] [place], [place], [address] Phone: [phone]   Fax:  [phone]  Name: MCKENIZE MEZERA MRN: [id number] Date of Birth: 1934-08-29

## 2020-04-07 ENCOUNTER — Ambulatory Visit: Payer: Medicare HMO | Admitting: Occupational Therapy

## 2020-04-07 ENCOUNTER — Encounter: Payer: Self-pay | Admitting: Occupational Therapy

## 2020-04-07 ENCOUNTER — Ambulatory Visit: Payer: Medicare HMO

## 2020-04-07 ENCOUNTER — Other Ambulatory Visit: Payer: Self-pay

## 2020-04-07 DIAGNOSIS — M6281 Muscle weakness (generalized): Secondary | ICD-10-CM

## 2020-04-07 DIAGNOSIS — R2689 Other abnormalities of gait and mobility: Secondary | ICD-10-CM

## 2020-04-07 DIAGNOSIS — R278 Other lack of coordination: Secondary | ICD-10-CM

## 2020-04-07 NOTE — Therapy (Signed)
[place] MAIN [facility] SERVICES [address] [place], [place], [address] Phone: [phone]   Fax:  [phone]  Physical Therapy Treatment/Recertification for dates  01/13/2021 Through 04/07/2020  Patient Details  Name: Sherry [name] MRN: [id number] Date of Birth: Nov 19, 1934 Referring Provider (PT): Viviana Simpler   Encounter Date: 04/07/2020   PT End of Session - 04/08/20 0658    Visit Number 38    Number of Visits 54    Date for PT Re-Evaluation 04/07/20    PT Start Time 1146    PT Stop Time 1222    PT Time Calculation (min) 36 min    Equipment Utilized During Treatment Gait belt    Activity Tolerance Patient tolerated treatment well;Patient limited by fatigue           Past Medical History:  Diagnosis Date  . Acute blood loss anemia   . Arthritis   . Cancer ([place])    skin  . Central cord syndrome at C6 level of cervical spinal cord ([name]) 11/29/2017  . Hypertension   . Protein-calorie malnutrition, severe ([place]) 01/24/2018  . S/P insertion of IVC (inferior vena caval) filter 01/24/2018  . Tetraparesis [facility])     Past Surgical History:  Procedure Laterality Date  . ANTERIOR CERVICAL DECOMP/DISCECTOMY FUSION N/A 11/29/2017   Procedure: Cervical five-six, six-seven Anterior Cervical Decompression Fusion;  Surgeon: Judith Part, MD;  Location: [place];  Service: Neurosurgery;  Laterality: N/A;  Cervical five-six, six-seven Anterior Cervical Decompression Fusion  . CATARACT EXTRACTION    . IR IVC FILTER PLMT / S&I /IMG GUID/MOD SED  12/08/2017    There were no vitals filed for this visit.   Subjective Assessment - 04/07/20 1151    Subjective Patient reports some right arm soreness but otherwise doing okay today.    Patient is accompained by: --   [name] (aide)   Pertinent History Pt is an 86 yo female that fell in 2019, fractured vertebrae in her neck and in her low back per family. Per chart, pt experienced incomplete  quadiparesis at level C6. After hospital stay, pt discharged to [facility] for ~1 month, experienced severe UTI as well as bilateral DVT (IVC filter placed). Discharged to [facility], stayed for about a year, and then moved to [facility] in April 2021. Was receiving PT, but per family facility reported that did not have adequate equipment to maximize PT for her. Pt until about 1 month ago was practicing sit to stand transfers with 1-2 people, and working on static standing. Has a brace for L foot due to PF. Pt currently needs assistance with all ADLs (able to assist with donning/doffing her shirt), bed baths, and needs a hoyer lift for transfers. Able to drive power wheelchair without assistance.    Limitations Standing;Walking;House hold activities;Lifting    How long can you sit comfortably? NA    How long can you stand comfortably? unable    How long can you walk comfortably? unable    Patient Stated Goals "to stand up and walk"    Currently in Pain? No/denies    Pain Score 2     Pain Location Shoulder    Pain Orientation Anterior;Right    Pain Descriptors / Indicators Aching    Pain Type Chronic pain    Pain Onset 1 to 4 weeks ago           Reassessed LTG today and patient has potential to make progress with transfers and LE strength with  continued PT services.    Neuromuscular re-education:  Patient performed 4 trials of static standing in //bars-  1) 1 min 7 sec with Max A sit to stand stand and position hips/trunk forward - once she was centered she was able to engage core/glutes/UE support with verbal and tactile cues. Patient with reminders to look ahead and keep glutes engaged.  2) 47 sec - Patient continued to present with left LE out in front and unabel to take a step backward.   3)  1 min 45 sec-  Much less assistance- min A once standing and able to control posture and maintain gluteal contraction. 4) 56 sec -  Continued VCs and min assistance - decreased time due to patient  fatigue.  5) 1 min 10 sec- Patient able to perform some lateral weight shifting but unable to take a step forward or backward with Left LE  Pt educated throughout session about proper posture and technique with exercises. Improved exercise technique, movement at target joints, use of target muscles after min to mod verbal, visual, tactile cues.                        PT Education - 04/08/20 0655    Education Details postural cues with standing    Person(s) Educated Patient    Methods Explanation;Tactile cues;Verbal cues;Demonstration    Comprehension Verbalized understanding;Tactile cues required;Returned demonstration;Verbal cues required;Need further instruction            PT Short Term Goals - 03/10/20 2211      PT SHORT TERM GOAL #1   Title The patient will perform initial HEP with minimum assistance in order to improve strength and function.    Baseline Patient demonstrating independence with initial HEP as of 03/10/2020 with no questions or difficulty.    Time 4    Period Weeks    Status Achieved    Target Date 02/11/20             PT Long Term Goals - 04/07/20 1155      PT LONG TERM GOAL #1   Title The patient will be compliant with finalized HEP with minimum assistance in preparation for self management and maintenance of condition.    Baseline 03/10/2020- Patient familiar with initial HEP and will keep goal active as HEP becomes more progressive including possible transfers and standing activities. 04/07/2020- Patient continues to report compliance with LE strengthening home exercises without questions or concerns.    Time 12    Period Weeks    Status On-going    Target Date 06/30/20      PT LONG TERM GOAL #2   Title The patient will demonstrate at least 10 point improvement on FOTO score indicating an improved ability to perform functional activities.    Baseline on eval (/720) score 12; 10/22/19: 12; 12/10/19: 18, 12/13: 18: 04/07/2020- Will assess  next visit.    Time 12    Period Weeks    Status On-going    Target Date 06/30/20      PT LONG TERM GOAL #3   Title The patient will demonstrate lateral scooting  for assistance with transfers with minA to maximize independence.    Baseline 10/22/19: Pt requires modA+1 for lateral scooting; 12/10/19: modA+1, 12/13: modA-maxA, 12/27: modA-maxA, 03/10/2020- ModA-MaxA- increased verbal cues and visual demonstration. 04/07/2020- Continued Max assist to perform forward/lateral scoot.    Time 12    Period Weeks    Status On-going  Target Date 06/30/20      PT LONG TERM GOAL #4   Title The patient will demonstrate a squat pivot transfer with minimum assistance to maximize independence and mobility.    Baseline 10/22/19: unable to safely attempt at this time; 12/10/19: unable to safely attempt at this time, 01/14/20: unable to safety attempt at this time. 03/10/2020- Patient able to perform Stand pivot transfer with Maximal assist. 04/07/2020- Patient continues to require Max assist with sit to stand- will assist SPT next visit.    Time 12    Period Weeks    Status On-going    Target Date 06/30/20      PT LONG TERM GOAL #5   Title The patient will demonstrate sitting without UE support for 2-5 minutes at EOB to improve participation and maximize independence with ADLs.    Baseline 10/22/19: Pt able ot sit unsupported for at least 2 minutes at edge of bed    Time 12    Period Weeks    Status Achieved                 Plan - 04/07/20 0656    Clinical Impression Statement Overall patient demonstrated progress with standing with less physical assistance and improved ability tot facilitate her glutes today. She will would benefit from continued PT services to increase mobility and strength to improve patient's quality of life.    Personal Factors and Comorbidities Age;Time since onset of injury/illness/exacerbation;Comorbidity 3+;Fitness    Comorbidities HTN, quadriparesis, history of DVT,  neurogenic bladder    Examination-Activity Limitations Bathing;Hygiene/Grooming;Squat;Bed Mobility;Lift;Bend;Stand;Engineer, manufacturing;Toileting;Self Feeding;Transfers;Continence;Sit;Dressing;Sleep;Carry    Examination-Participation Restrictions Church;Laundry;Cleaning;Medication Management;Community Activity;Meal Prep;Interpersonal Relationship    Stability/Clinical Decision Making Evolving/Moderate complexity    Rehab Potential Fair    PT Frequency 2x / week    PT Duration 12 weeks    PT Treatment/Interventions ADLs/Self Care Home Management;Electrical Stimulation;Therapeutic activities;Wheelchair mobility training;Vasopneumatic Device;Joint Manipulations;Vestibular;Passive range of motion;Patient/family education;Therapeutic exercise;DME Instruction;Biofeedback;Aquatic Therapy;Moist Heat;Gait training;Balance training;Orthotic Fit/Training;Dry needling;Energy conservation;Taping;Splinting;Neuromuscular re-education;Cryotherapy;Ultrasound;Functional mobility training    PT Next Visit Plan Continue with progressive Standing/transfer training.    Consulted and Agree with Plan of Care Patient           Patient will benefit from skilled therapeutic intervention in order to improve the following deficits and impairments:  Abnormal gait,Decreased balance,Decreased endurance,Decreased mobility,Difficulty walking,Hypomobility,Impaired sensation,Decreased range of motion,Improper body mechanics,Impaired perceived functional ability,Decreased activity tolerance,Decreased knowledge of use of DME,Decreased safety awareness,Decreased strength,Impaired flexibility,Impaired UE functional use,Postural dysfunction  Visit Diagnosis: Muscle weakness (generalized)  Other lack of coordination  Other abnormalities of gait and mobility     Problem List Patient Active Problem List   Diagnosis Date Noted  . Acute appendicitis with appendiceal abscess 05/30/2018  . Hypocalcemia 02/15/2018  .  Dysuria 02/15/2018  . Bilateral lower extremity edema 02/11/2018  . Vaginal yeast infection 01/30/2018  . Chest tightness 01/27/2018  . Healthcare-associated pneumonia 01/25/2018  . Pleural effusion, not elsewhere classified 01/25/2018  . Acute deep vein thrombosis (DVT) of left lower extremity ([place]) 01/24/2018  . Acute deep vein thrombosis (DVT) of right lower extremity ([place]) 01/24/2018  . Chronic allergic rhinitis 01/24/2018  . Depression with anxiety 01/24/2018  . UTI due to Klebsiella species 01/24/2018  . Protein-calorie malnutrition, severe ([place]) 01/24/2018  . S/P insertion of IVC (inferior vena caval) filter 01/24/2018  . Reactive depression   . Hypokalemia   . Leukocytosis   . Essential hypertension   . Trauma   . Tetraparesis ([place])   . Neuropathic pain   .  Neurogenic bowel   . Neurogenic bladder   . Benign essential HTN   . Acute blood loss anemia   . Central cord syndrome at C6 level of cervical spinal cord ([place]) 11/29/2017  . Central cord syndrome [facility]) 11/29/2017    Lewis Moccasin, PT 04/08/2020, 12:12 PM  [place] MAIN [facility] SERVICES [address] [place], [place], [address] Phone: [phone]   Fax:  [phone]  Name: Sherry [name] MRN: [id number] Date of Birth: 03/24/1934

## 2020-04-07 NOTE — Therapy (Signed)
[place] MAIN [facility] SERVICES [address] [place], [place], [address] Phone: [phone]   Fax:  [phone]  Occupational Therapy Treatment  Patient Details  Name: Sherry [name] MRN: [id number] Date of Birth: 1934-04-25 No data recorded  Encounter Date: 04/07/2020   OT End of Session - 04/07/20 1655    Visit Number 41    Number of Visits 29    Date for OT Re-Evaluation 05/21/20    Authorization Time Period Progress report period starting 04/02/2020    OT Start Time 1100    OT Stop Time 1145    OT Time Calculation (min) 45 min    Equipment Utilized During Treatment moist heat    Activity Tolerance Patient tolerated treatment well    Behavior During Therapy [facility] for tasks assessed/performed           Past Medical History:  Diagnosis Date  . Acute blood loss anemia   . Arthritis   . Cancer ([place])    skin  . Central cord syndrome at C6 level of cervical spinal cord ([place]) 11/29/2017  . Hypertension   . Protein-calorie malnutrition, severe ([place]) 01/24/2018  . S/P insertion of IVC (inferior vena caval) filter 01/24/2018  . Tetraparesis [facility])     Past Surgical History:  Procedure Laterality Date  . ANTERIOR CERVICAL DECOMP/DISCECTOMY FUSION N/A 11/29/2017   Procedure: Cervical five-six, six-seven Anterior Cervical Decompression Fusion;  Surgeon: Judith Part, MD;  Location: [place];  Service: Neurosurgery;  Laterality: N/A;  Cervical five-six, six-seven Anterior Cervical Decompression Fusion  . CATARACT EXTRACTION    . IR IVC FILTER PLMT / S&I /IMG GUID/MOD SED  12/08/2017    There were no vitals filed for this visit.   Subjective Assessment - 04/07/20 1653    Subjective  Pt. reports feeling well today, however her Right shoulder was sore yesterday after using her w/c outside.    Patient is accompanied by: Family member    Pertinent History Patient s/p fall November 29, 2017 resulting in diagnosis of central cord syndrome at C  6 level.  She has had therapy in multiple venues but with recent move to [facility], therapy staff recommended she seek outpatient therapy for her needs.    Currently in Pain? Yes    Pain Score 3    Soreness   Pain Location Shoulder    Pain Orientation Right    Pain Descriptors / Indicators Sore          OT Treatment  Pt.tolerated bilateral shoulder flexion, extension, abduction, elbow flexion, extension forearm supination AAROM/AROM. PROM bilateral wrist extension, digit MP, PIP, and DIP flexion, and extension in conjunction with moist heat.  Pt. reports 2/10 pain in her right shoulder. Pt. reports that her right shoulder soreness is a result of constantly pushing the controls of her electric w/c outside yesterday.  Pt. continues to make progress overall with improving BUE ROM. Pt. continues to progress towards formulating a fist. Pt. Is improving with writing, and improving with legibility.  Pt.continues to engageher right hand whenperforming self-feeding with adpativeequipment, and is improving with UE dressing tasks.Pt. Continues to be able to use her right thumb to hold items. Pt. tolerated ROM well without reports of pain, or discomfort. Pt.continues to benefit from working on improving ROM in order to work towards increasing engagement of her bilateral hands during ADLs, and IADL tasks.  OT Education - 04/07/20 1655    Education Details BUE ROM with functional reaching    Person(s) Educated Patient;Caregiver(s)    Methods Explanation;Demonstration    Comprehension Verbalized understanding;Need further instruction               OT Long Term Goals - 04/02/20 1549      OT LONG TERM GOAL #1   Title Patient and caregiver will demonstrate understanding of home exercise program for ROM.    Baseline Pt. continues to have a restorative aide rehab aide assist her wih exercises at [facility]    Time 12    Period Weeks     Status On-going    Target Date 05/21/20      OT LONG TERM GOAL #2   Title Patient will demonstrate ability to don shirt with min assist from seated position.    Baseline Pt. is indepedent with a larger shirt, and continues to require increased time to complete    Time 12    Period Weeks    Status On-going    Target Date 05/21/20      OT LONG TERM GOAL #4   Title Patient will demonstrate improved composite finger flexion to hold deodorant to apply to underarms.    Baseline Pt. continues to present with improving finger flexion of R hand for a partial gross grasp, but unable to grasp and hold items. Very limited digit flexion of L hand. Pt. has active left thumb abduction.    Time 12    Period Weeks    Status On-going    Target Date 05/21/20      OT LONG TERM GOAL #6   Title Patient will increase right UE ROM to comb the back of her hair with modified independence.    Baseline Pt. continues to be able to comb the front, and sides of her head. Pt. can now reach the back, however cannot see what she is doing in the back of her head.    Time 12    Period Weeks    Status Partially Met    Target Date 05/21/20      OT LONG TERM GOAL #8   Title Pt. will write, and sign her name with 100% legibility, and modified independence.    Baseline Pt. conitnues to  consistently filling out her daily menu, and writing for crossword puzzles. Pt. is able to maintain grasp on a wide width pen. [place]us to work on Media planner.    Time 12    Period Weeks    Status On-going    Target Date 05/21/20                 Plan - 04/07/20 1655    Clinical Impression Statement Pt. reports 2/10 pain in her right shoulder. Pt. reports that her right shoulder soreness is a result of constantly pushing the controls of her electric w/c outside yesterday.  Pt. continues to make progress overall with improving BUE ROM. Pt. continues to progress towards formulating a fist. Pt. Is improving with writing, and  improving with legibility.  Pt.continues to engageher right hand whenperforming self-feeding with adpativeequipment, and is improving with UE dressing tasks.Pt. Continues to be able to use her right thumb to hold items. Pt. tolerated ROM well without reports of pain, or discomfort. Pt.continues to benefit from working on improving ROM in order to work towards increasing engagement of her bilateral hands during ADLs, and IADL tasks.   OT Occupational Profile and  History Detailed Assessment- Review of Records and additional review of physical, cognitive, psychosocial history related to current functional performance    Occupational performance deficits (Please refer to evaluation for details): ADL's;IADL's;Leisure;Social Participation    Body Structure / Function / Physical Skills ADL;Continence;Dexterity;Flexibility;Strength;ROM;Balance;Coordination;FMC;IADL;Endurance;UE functional use;Decreased knowledge of use of DME;GMC    Psychosocial Skills Environmental  Adaptations;Habits;Routines and Behaviors    Rehab Potential Fair    Clinical Decision Making Limited treatment options, no task modification necessary    Comorbidities Affecting Occupational Performance: Presence of comorbidities impacting occupational performance    Comorbidities impacting occupational performance description: contractures of bilateral hands, dependent transfers,    Modification or Assistance to Complete Evaluation  No modification of tasks or assist necessary to complete eval    OT Frequency 2x / week    OT Duration 12 weeks    OT Treatment/Interventions Self-care/ADL training;Cryotherapy;Therapeutic exercise;DME and/or AE instruction;Balance training;Neuromuscular education;Manual Therapy;Splinting;Moist Heat;Passive range of motion;Therapeutic activities;Patient/family education    Consulted and Agree with Plan of Care Patient           Patient will benefit from skilled therapeutic intervention in order to improve  the following deficits and impairments:   Body Structure / Function / Physical Skills: ADL,Continence,Dexterity,Flexibility,Strength,ROM,Balance,Coordination,FMC,IADL,Endurance,UE functional use,Decreased knowledge of use of [facility]   Psychosocial Skills: Environmental  Adaptations,Habits,Routines and Behaviors   Visit Diagnosis: Muscle weakness (generalized)  Other lack of coordination    Problem List Patient Active Problem List   Diagnosis Date Noted  . Acute appendicitis with appendiceal abscess 05/30/2018  . Hypocalcemia 02/15/2018  . Dysuria 02/15/2018  . Bilateral lower extremity edema 02/11/2018  . Vaginal yeast infection 01/30/2018  . Chest tightness 01/27/2018  . Healthcare-associated pneumonia 01/25/2018  . Pleural effusion, not elsewhere classified 01/25/2018  . Acute deep vein thrombosis (DVT) of left lower extremity ([place]) 01/24/2018  . Acute deep vein thrombosis (DVT) of right lower extremity ([place]) 01/24/2018  . Chronic allergic rhinitis 01/24/2018  . Depression with anxiety 01/24/2018  . UTI due to Klebsiella species 01/24/2018  . Protein-calorie malnutrition, severe ([place]) 01/24/2018  . S/P insertion of IVC (inferior vena caval) filter 01/24/2018  . Reactive depression   . Hypokalemia   . Leukocytosis   . Essential hypertension   . Trauma   . Tetraparesis ([place])   . Neuropathic pain   . Neurogenic bowel   . Neurogenic bladder   . Benign essential HTN   . Acute blood loss anemia   . Central cord syndrome at C6 level of cervical spinal cord ([place]) 11/29/2017  . Central cord syndrome [facility]) 11/29/2017    Harrel Carina, MS, OTR/L 04/07/2020, 4:58 PM  [place] MAIN [facility] SERVICES [address] [place], [place], [address] Phone: [phone]   Fax:  [phone]  Name: Sherry [name] MRN: [id number] Date of Birth: 29-Jan-1935

## 2020-04-09 ENCOUNTER — Encounter: Payer: Self-pay | Admitting: Occupational Therapy

## 2020-04-09 ENCOUNTER — Ambulatory Visit: Payer: Medicare HMO

## 2020-04-09 ENCOUNTER — Ambulatory Visit: Payer: Medicare HMO | Admitting: Occupational Therapy

## 2020-04-09 ENCOUNTER — Other Ambulatory Visit: Payer: Self-pay

## 2020-04-09 DIAGNOSIS — M6281 Muscle weakness (generalized): Secondary | ICD-10-CM | POA: Diagnosis not present

## 2020-04-09 DIAGNOSIS — R2689 Other abnormalities of gait and mobility: Secondary | ICD-10-CM

## 2020-04-09 DIAGNOSIS — R278 Other lack of coordination: Secondary | ICD-10-CM

## 2020-04-09 NOTE — Therapy (Signed)
[place] MAIN [facility] SERVICES [address] [place], [place], [address] Phone: [phone]   Fax:  [phone]  Occupational Therapy Treatment  Patient Details  Name: Sherry [name] MRN: [id number] Date of Birth: 09-Sep-1934 No data recorded  Encounter Date: 04/09/2020   OT End of Session - 04/09/20 1109    Visit Number 42    Number of Visits 22    Date for OT Re-Evaluation 05/21/20    Authorization Time Period Progress report period starting 04/02/2020    OT Start Time 1100    OT Stop Time 1145    OT Time Calculation (min) 45 min    Activity Tolerance Patient tolerated treatment well    Behavior During Therapy [facility] for tasks assessed/performed           Past Medical History:  Diagnosis Date  . Acute blood loss anemia   . Arthritis   . Cancer ([place])    skin  . Central cord syndrome at C6 level of cervical spinal cord ([place]) 11/29/2017  . Hypertension   . Protein-calorie malnutrition, severe ([place]) 01/24/2018  . S/P insertion of IVC (inferior vena caval) filter 01/24/2018  . Tetraparesis [facility])     Past Surgical History:  Procedure Laterality Date  . ANTERIOR CERVICAL DECOMP/DISCECTOMY FUSION N/A 11/29/2017   Procedure: Cervical five-six, six-seven Anterior Cervical Decompression Fusion;  Surgeon: Judith Part, MD;  Location: [place];  Service: Neurosurgery;  Laterality: N/A;  Cervical five-six, six-seven Anterior Cervical Decompression Fusion  . CATARACT EXTRACTION    . IR IVC FILTER PLMT / S&I /IMG GUID/MOD SED  12/08/2017    There were no vitals filed for this visit.   Subjective Assessment - 04/09/20 1107    Subjective  Pt. reports that she slept in today    Patient is accompanied by: Family member    Pertinent History Patient s/p fall November 29, 2017 resulting in diagnosis of central cord syndrome at C 6 level.  She has had therapy in multiple venues but with recent move to [facility], therapy staff recommended she seek  outpatient therapy for her needs.    Currently in Pain? Yes    Pain Score 3     Pain Location Shoulder    Pain Orientation Right    Pain Descriptors / Indicators Sore          OT Treatment  Pt.tolerated bilateral shoulder flexion, extension, abduction, elbow flexion, extension forearm supination AAROM/AROM. PROM bilateral wrist extension, digit MP, PIP, and DIP flexion, and extension in conjunction with moist heat.  Pt. reports that her right shoulder soreness is a result of constantly pushing the controls of her electric w/c outside yesterday.  Pt. continues to make progress overall with improving BUE ROM.Pt. continues to progress towards formulating a fist. Pt. Is improving with writing, and improving with legibility.Pt.continues to engageher right hand whenperforming self-feeding with adpativeequipment, and is improving with UE dressing tasks.Pt.Continues to beable to use her right thumb to hold items. Pt. tolerated ROM well without reports of pain, or discomfort. Pt.continues to benefit from working on improving ROM in order to work towards increasing engagement of her bilateral hands during ADLs, and IADL tasks.                        OT Education - 04/09/20 1109    Education Details BUE ROM with functional reaching    Person(s) Educated Patient;Caregiver(s)    Methods Explanation;Demonstration  Comprehension Verbalized understanding;Need further instruction               OT Long Term Goals - 04/02/20 1549      OT LONG TERM GOAL #1   Title Patient and caregiver will demonstrate understanding of home exercise program for ROM.    Baseline Pt. continues to have a restorative aide rehab aide assist her wih exercises at [facility]    Time 12    Period Weeks    Status On-going    Target Date 05/21/20      OT LONG TERM GOAL #2   Title Patient will demonstrate ability to don shirt with min assist from seated position.    Baseline Pt. is  indepedent with a larger shirt, and continues to require increased time to complete    Time 12    Period Weeks    Status On-going    Target Date 05/21/20      OT LONG TERM GOAL #4   Title Patient will demonstrate improved composite finger flexion to hold deodorant to apply to underarms.    Baseline Pt. continues to present with improving finger flexion of R hand for a partial gross grasp, but unable to grasp and hold items. Very limited digit flexion of L hand. Pt. has active left thumb abduction.    Time 12    Period Weeks    Status On-going    Target Date 05/21/20      OT LONG TERM GOAL #6   Title Patient will increase right UE ROM to comb the back of her hair with modified independence.    Baseline Pt. continues to be able to comb the front, and sides of her head. Pt. can now reach the back, however cannot see what she is doing in the back of her head.    Time 12    Period Weeks    Status Partially Met    Target Date 05/21/20      OT LONG TERM GOAL #8   Title Pt. will write, and sign her name with 100% legibility, and modified independence.    Baseline Pt. conitnues to  consistently filling out her daily menu, and writing for crossword puzzles. Pt. is able to maintain grasp on a wide width pen. [place]ine[place] to work on Media planner.    Time 12    Period Weeks    Status On-going    Target Date 05/21/20                 Plan - 04/09/20 1110    Clinical Impression Statement Pt. reports that her right shoulder soreness is a result of constantly pushing the controls of her electric w/c outside yesterday.  Pt. continues to make progress overall with improving BUE ROM.Pt. continues to progress towards formulating a fist. Pt. Is improving with writing, and improving with legibility.Pt.continues to engageher right hand whenperforming self-feeding with adpativeequipment, and is improving with UE dressing tasks.Pt.Continues to beable to use her right thumb to hold items.  Pt. tolerated ROM well without reports of pain, or discomfort. Pt.continues to benefit from working on improving ROM in order to work towards increasing engagement of her bilateral hands during ADLs, and IADL tasks.     OT Occupational Profile and History Detailed Assessment- Review of Records and additional review of physical, cognitive, psychosocial history related to current functional performance    Occupational performance deficits (Please refer to evaluation for details): ADL's;IADL's;Leisure;Social Participation    Body Structure / Function /  Physical Skills ADL;Continence;Dexterity;Flexibility;Strength;ROM;Balance;Coordination;FMC;IADL;Endurance;UE functional use;Decreased knowledge of use of DME;GMC    Psychosocial Skills Environmental  Adaptations;Habits;Routines and Behaviors    Rehab Potential Fair    Clinical Decision Making Limited treatment options, no task modification necessary    Comorbidities Affecting Occupational Performance: Presence of comorbidities impacting occupational performance    OT Frequency 2x / week    OT Duration 12 weeks    OT Treatment/Interventions Self-care/ADL training;Cryotherapy;Therapeutic exercise;DME and/or AE instruction;Balance training;Neuromuscular education;Manual Therapy;Splinting;Moist Heat;Passive range of motion;Therapeutic activities;Patient/family education    Consulted and Agree with Plan of Care Patient           Patient will benefit from skilled therapeutic intervention in order to improve the following deficits and impairments:   Body Structure / Function / Physical Skills: ADL,Continence,Dexterity,Flexibility,Strength,ROM,Balance,Coordination,FMC,IADL,Endurance,UE functional use,Decreased knowledge of use of [facility]   Psychosocial Skills: Environmental  Adaptations,Habits,Routines and Behaviors   Visit Diagnosis: Muscle weakness (generalized)  Other lack of coordination    Problem List Patient Active Problem List    Diagnosis Date Noted  . Acute appendicitis with appendiceal abscess 05/30/2018  . Hypocalcemia 02/15/2018  . Dysuria 02/15/2018  . Bilateral lower extremity edema 02/11/2018  . Vaginal yeast infection 01/30/2018  . Chest tightness 01/27/2018  . Healthcare-associated pneumonia 01/25/2018  . Pleural effusion, not elsewhere classified 01/25/2018  . Acute deep vein thrombosis (DVT) of left lower extremity ([place]) 01/24/2018  . Acute deep vein thrombosis (DVT) of right lower extremity ([place]) 01/24/2018  . Chronic allergic rhinitis 01/24/2018  . Depression with anxiety 01/24/2018  . UTI due to Klebsiella species 01/24/2018  . Protein-calorie malnutrition, severe ([place]) 01/24/2018  . S/P insertion of IVC (inferior vena caval) filter 01/24/2018  . Reactive depression   . Hypokalemia   . Leukocytosis   . Essential hypertension   . Trauma   . Tetraparesis ([place])   . Neuropathic pain   . Neurogenic bowel   . Neurogenic bladder   . Benign essential HTN   . Acute blood loss anemia   . Central cord syndrome at C6 level of cervical spinal cord ([place]) 11/29/2017  . Central cord syndrome [facility]) 11/29/2017    Harrel Carina, MS, OTR/L 04/09/2020, 1:41 PM  [place] MAIN [facility] SERVICES [address] [place], [place], [address] Phone: [phone]   Fax:  [phone]  Name: JAIDALYN SCHILLO MRN: [id number] Date of Birth: 09-17-1934

## 2020-04-10 NOTE — Therapy (Signed)
[place] MAIN [facility] SERVICES [address] [place], [place], [address] Phone: [phone]   Fax:  [phone]  Physical Therapy Treatment  Patient Details  Name: Sherry [name] MRN: [id number] Date of Birth: [birth year] Referring Provider (PT): Viviana Simpler   Encounter Date: 04/09/2020   PT End of Session - 04/09/20 1152    Visit Number 47    Number of Visits 65    Date for PT Re-Evaluation 06/30/20    PT Start Time 5852    PT Stop Time 1222    PT Time Calculation (min) 37 min    Equipment Utilized During Treatment Gait belt    Activity Tolerance Patient tolerated treatment well;Patient limited by fatigue           Past Medical History:  Diagnosis Date  . Acute blood loss anemia   . Arthritis   . Cancer ([place])    skin  . Central cord syndrome at C6 level of cervical spinal cord ([place]) 11/29/2017  . Hypertension   . Protein-calorie malnutrition, severe ([place]) 01/24/2018  . S/P insertion of IVC (inferior vena caval) filter 01/24/2018  . Tetraparesis [facility])     Past Surgical History:  Procedure Laterality Date  . ANTERIOR CERVICAL DECOMP/DISCECTOMY FUSION N/A 11/29/2017   Procedure: Cervical five-six, six-seven Anterior Cervical Decompression Fusion;  Surgeon: Judith Part, MD;  Location: [place];  Service: Neurosurgery;  Laterality: N/A;  Cervical five-six, six-seven Anterior Cervical Decompression Fusion  . CATARACT EXTRACTION    . IR IVC FILTER PLMT / S&I /IMG GUID/MOD SED  12/08/2017    There were no vitals filed for this visit.   Subjective Assessment - 04/09/20 1155    Subjective Patient reports doing well today without difficulty. Reports today has been a sleepy day due to the rain.    Patient is accompained by: --   [name] (aide)   Pertinent History Pt is an 86 yo female that fell in 2019, fractured vertebrae in her neck and in her low back per family. 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  After hospital stay, pt discharged to CIR for ~1 month, experienced severe UTI as well as bilateral DVT (IVC filter placed). Discharged to [facility], stayed for about a year, and then moved to [facility] in April 2021. Was receiving PT, but per family facility reported that did not have adequate equipment to maximize PT for her. Pt until about 1 month ago was practicing sit to stand transfers with 1-2 people, and working on static standing. Has a brace for L foot due to PF. Pt currently needs assistance with all ADLs (able to assist with donning/doffing her shirt), bed baths, and needs a hoyer lift for transfers. Able to drive power wheelchair without assistance.    Limitations Standing;Walking;House hold activities;Lifting    How long can you sit comfortably? NA    How long can you stand comfortably? unable    How long can you walk comfortably? unable    Patient Stated Goals "to stand up and walk"    Currently in Pain? No/denies    Pain Onset 1 to 4 weeks ago            Transfer training from power chair to/from mat: 1) Chair to mat-  Max assist to position her feet and max assist to stand, Able to pivot transfer dependently with patient holding onto PT arms. Able to transfer safely without compromising knee position.  2) Mat to power chair-  Max assist to stand and dependent with transfer back to chair. No significant difference.  3) chair to mat-  4) mat to chair- No significant changes. Patient unable to effectively transfer back to chair or pivot/move LE to assist. She was able to engage glutes and stand well.   Patient too fatigued to continue or perform LE strengthening today.                          PT Education - 04/10/20 1200    Education Details Transfer technique    Person(s) Educated Patient    Methods Explanation;Demonstration;Tactile cues;Verbal cues    Comprehension Verbalized understanding;Returned demonstration;Verbal cues required;Tactile cues  required;Need further instruction            PT Short Term Goals - 03/10/20 2211      PT SHORT TERM GOAL #1   Title The patient will perform initial HEP with minimum assistance in order to improve strength and function.    Baseline Patient demonstrating independence with initial HEP as of 03/10/2020 with no questions or difficulty.    Time 4    Period Weeks    Status Achieved    Target Date 02/11/20             PT Long Term Goals - 04/07/20 1155      PT LONG TERM GOAL #1   Title The patient will be compliant with finalized HEP with minimum assistance in preparation for self management and maintenance of condition.    Baseline 03/10/2020- Patient familiar with initial HEP and will keep goal active as HEP becomes more progressive including possible transfers and standing activities. 04/07/2020- Patient continues to report compliance with LE strengthening home exercises without questions or concerns.    Time 12    Period Weeks    Status On-going    Target Date 06/30/20      PT LONG TERM GOAL #2   Title The patient will demonstrate at least 10 point improvement on FOTO score indicating an improved ability to perform functional activities.    Baseline on eval (/720) score 12; 10/22/19: 12; 12/10/19: 18, 12/13: 18: 04/07/2020- Will assess next visit.    Time 12    Period Weeks    Status On-going    Target Date 06/30/20      PT LONG TERM GOAL #3   Title The patient will demonstrate lateral scooting  for assistance with transfers with minA to maximize independence.    Baseline 10/22/19: Pt requires modA+1 for lateral scooting; 12/10/19: modA+1, 12/13: modA-maxA, 12/27: modA-maxA, 03/10/2020- ModA-MaxA- increased verbal cues and visual demonstration. 04/07/2020- Continued Max assist to perform forward/lateral scoot.    Time 12    Period Weeks    Status On-going    Target Date 06/30/20      PT LONG TERM GOAL #4   Title The patient will demonstrate a squat pivot transfer with minimum  assistance to maximize independence and mobility.    Baseline 10/22/19: unable to safely attempt at this time; 12/10/19: unable to safely attempt at this time, 01/14/20: unable to safety attempt at this time. 03/10/2020- Patient able to perform Stand pivot transfer with Maximal assist. 04/07/2020- Patient continues to require Max assist with sit to stand- will assist SPT next visit.    Time 12    Period Weeks    Status On-going    Target Date 06/30/20      PT LONG TERM GOAL #5  Title The patient will demonstrate sitting without UE support for 2-5 minutes at EOB to improve participation and maximize independence with ADLs.    Baseline 10/22/19: Pt able ot sit unsupported for at least 2 minutes at edge of bed    Time 12    Period Weeks    Status Achieved                 Plan - 04/09/20 1156    Clinical Impression Statement Treamtment  focused on transfer training as patient desired to practice to see her progress. She continued to perform with max assist to stand yet dependent with turning - requiring assistance to position feet to avoid knee compromising positioning.  She will would benefit from continued PT services to increase mobility and strength to improve patient's quality of life.    Personal Factors and Comorbidities Age;Time since onset of injury/illness/exacerbation;Comorbidity 3+;Fitness    Comorbidities HTN, quadriparesis, history of DVT, neurogenic bladder    Examination-Activity Limitations Bathing;Hygiene/Grooming;Squat;Bed Mobility;Lift;Bend;Stand;Engineer, manufacturing;Toileting;Self Feeding;Transfers;Continence;Sit;Dressing;Sleep;Carry    Examination-Participation Restrictions Church;Laundry;Cleaning;Medication Management;Community Activity;Meal Prep;Interpersonal Relationship    Stability/Clinical Decision Making Evolving/Moderate complexity    Rehab Potential Fair    PT Frequency 2x / week    PT Duration 12 weeks    PT Treatment/Interventions ADLs/Self Care  Home Management;Electrical Stimulation;Therapeutic activities;Wheelchair mobility training;Vasopneumatic Device;Joint Manipulations;Vestibular;Passive range of motion;Patient/family education;Therapeutic exercise;DME Instruction;Biofeedback;Aquatic Therapy;Moist Heat;Gait training;Balance training;Orthotic Fit/Training;Dry needling;Energy conservation;Taping;Splinting;Neuromuscular re-education;Cryotherapy;Ultrasound;Functional mobility training    PT Next Visit Plan Continue with progressive Standing/transfer training.    PT Home Exercise Plan no changes    Consulted and Agree with Plan of Care Patient           Patient will benefit from skilled therapeutic intervention in order to improve the following deficits and impairments:  Abnormal gait,Decreased balance,Decreased endurance,Decreased mobility,Difficulty walking,Hypomobility,Impaired sensation,Decreased range of motion,Improper body mechanics,Impaired perceived functional ability,Decreased activity tolerance,Decreased knowledge of use of DME,Decreased safety awareness,Decreased strength,Impaired flexibility,Impaired UE functional use,Postural dysfunction  Visit Diagnosis: Muscle weakness (generalized)  Other lack of coordination  Other abnormalities of gait and mobility     Problem List Patient Active Problem List   Diagnosis Date Noted  . Acute appendicitis with appendiceal abscess 05/30/2018  . Hypocalcemia 02/15/2018  . Dysuria 02/15/2018  . Bilateral lower extremity edema 02/11/2018  . Vaginal yeast infection 01/30/2018  . Chest tightness 01/27/2018  . Healthcare-associated pneumonia 01/25/2018  . Pleural effusion, not elsewhere classified 01/25/2018  . Acute deep vein thrombosis (DVT) of left lower extremity ([place]) 01/24/2018  . Acute deep vein thrombosis (DVT) of right lower extremity ([place]) 01/24/2018  . Chronic allergic rhinitis 01/24/2018  . Depression with anxiety 01/24/2018  . UTI due to Klebsiella species  01/24/2018  . Protein-calorie malnutrition, severe ([place]) 01/24/2018  . S/P insertion of IVC (inferior vena caval) filter 01/24/2018  . Reactive depression   . Hypokalemia   . Leukocytosis   . Essential hypertension   . Trauma   . Tetraparesis ([place])   . Neuropathic pain   . Neurogenic bowel   . Neurogenic bladder   . Benign essential HTN   . Acute blood loss anemia   . Central cord syndrome at C6 level of cervical spinal cord ([place]) 11/29/2017  . Central cord syndrome [facility]) 11/29/2017    Lewis Moccasin, PT 04/10/2020, 12:16 PM  [place] MAIN [facility] SERVICES [address] [place], [place], [address] Phone: [phone]   Fax:  [phone]  Name: CIRIA BERNARDINI MRN: [id number] Date of Birth:  02/16/1934   

## 2020-04-14 ENCOUNTER — Ambulatory Visit: Payer: Medicare HMO

## 2020-04-14 ENCOUNTER — Encounter: Payer: Self-pay | Admitting: Occupational Therapy

## 2020-04-14 ENCOUNTER — Ambulatory Visit: Payer: Medicare HMO | Admitting: Occupational Therapy

## 2020-04-14 ENCOUNTER — Other Ambulatory Visit: Payer: Self-pay

## 2020-04-14 DIAGNOSIS — R278 Other lack of coordination: Secondary | ICD-10-CM

## 2020-04-14 DIAGNOSIS — M6281 Muscle weakness (generalized): Secondary | ICD-10-CM | POA: Diagnosis not present

## 2020-04-14 DIAGNOSIS — R2689 Other abnormalities of gait and mobility: Secondary | ICD-10-CM

## 2020-04-14 NOTE — Therapy (Signed)
[place] MAIN [facility] SERVICES [address] [place], [place], [address] Phone: [phone]   Fax:  [phone]  Physical Therapy Treatment  Patient Details  Name: Sherry [name] MRN: [id number] Date of Birth: 01-10-1935 Referring Provider (PT): Viviana Simpler   Encounter Date: 04/14/2020   PT End of Session - 04/14/20 2213    Visit Number 48    Number of Visits 79    Date for PT Re-Evaluation 06/30/20    PT Start Time 1146    PT Stop Time 1228    PT Time Calculation (min) 42 min    Equipment Utilized During Treatment Gait belt    Activity Tolerance Patient tolerated treatment well;Patient limited by fatigue           Past Medical History:  Diagnosis Date  . Acute blood loss anemia   . Arthritis   . Cancer ([place])    skin  . Central cord syndrome at C6 level of cervical spinal cord ([place]) 11/29/2017  . Hypertension   . Protein-calorie malnutrition, severe ([place]) 01/24/2018  . S/P insertion of IVC (inferior vena caval) filter 01/24/2018  . Tetraparesis [facility])     Past Surgical History:  Procedure Laterality Date  . ANTERIOR CERVICAL DECOMP/DISCECTOMY FUSION N/A 11/29/2017   Procedure: Cervical five-six, six-seven Anterior Cervical Decompression Fusion;  Surgeon: Judith Part, MD;  Location: [place];  Service: Neurosurgery;  Laterality: N/A;  Cervical five-six, six-seven Anterior Cervical Decompression Fusion  . CATARACT EXTRACTION    . IR IVC FILTER PLMT / S&I /IMG GUID/MOD SED  12/08/2017    There were no vitals filed for this visit.   Subjective Assessment - 04/14/20 1147    Subjective Patient reports doing okay- had a great weekend.    Patient is accompained by: --   [name] (aide)   Pertinent History Pt is an 86 yo female that fell in 2019, fractured vertebrae in her neck and in her low back per family. 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 After hospital stay, pt discharged to CIR for ~1 month,  experienced severe UTI as well as bilateral DVT (IVC filter placed). Discharged to [facility], stayed for about a year, and then moved to [facility] in April 2021. Was receiving PT, but per family facility reported that did not have adequate equipment to maximize PT for her. Pt until about 1 month ago was practicing sit to stand transfers with 1-2 people, and working on static standing. Has a brace for L foot due to PF. Pt currently needs assistance with all ADLs (able to assist with donning/doffing her shirt), bed baths, and needs a hoyer lift for transfers. Able to drive power wheelchair without assistance.    Limitations Standing;Walking;House hold activities;Lifting    How long can you sit comfortably? NA    How long can you stand comfortably? unable    How long can you walk comfortably? unable    Patient Stated Goals "to stand up and walk"    Currently in Pain? No/denies    Pain Onset 1 to 4 weeks ago           Seated LE exercises:  March x20 reps BLE with minimal ability to actively raise either leg today requiring min assist  Knee ext x 20 reps with minimal assist with right LE- improved ability with eccentric control on left- able to hold 3 sec for last 5 reps on left LE.  Hip add squeeze on red ball x  10 reps - and 5 more with concentration on right LE only squeeze  Gluteal sets  - hold 5 sec x 10 reps B   Knee flex AROM on left (difficult yet able to perform with min active movement.)  Abdominal crunch forward 2sets of 10 reps. (no significant difficulty)  Patient wore just tennis shoe on left Rather than her brace to see if she could stand any better today - attempted x 3 trials yet patient unable to stand well today- < 10 sec x 3 trials- max assist to block left knee and patient unable to stand erect.  Pt educated throughout session about proper posture and technique with exercises. Improved exercise technique, movement at target joints, use of target muscles after min to mod  verbal, visual, tactile cues.                            PT Education - 04/14/20 1219    Education Details Postural cues for standing and specific LE strengthening    Person(s) Educated Patient    Methods Explanation;Demonstration;Tactile cues;Verbal cues    Comprehension Verbalized understanding;Tactile cues required;Returned demonstration;Need further instruction;Verbal cues required            PT Short Term Goals - 03/10/20 2211      PT SHORT TERM GOAL #1   Title The patient will perform initial HEP with minimum assistance in order to improve strength and function.    Baseline Patient demonstrating independence with initial HEP as of 03/10/2020 with no questions or difficulty.    Time 4    Period Weeks    Status Achieved    Target Date 02/11/20             PT Long Term Goals - 04/07/20 1155      PT LONG TERM GOAL #1   Title The patient will be compliant with finalized HEP with minimum assistance in preparation for self management and maintenance of condition.    Baseline 03/10/2020- Patient familiar with initial HEP and will keep goal active as HEP becomes more progressive including possible transfers and standing activities. 04/07/2020- Patient continues to report compliance with LE strengthening home exercises without questions or concerns.    Time 12    Period Weeks    Status On-going    Target Date 06/30/20      PT LONG TERM GOAL #2   Title The patient will demonstrate at least 10 point improvement on FOTO score indicating an improved ability to perform functional activities.    Baseline on eval (/720) score 12; 10/22/19: 12; 12/10/19: 18, 12/13: 18: 04/07/2020- Will assess next visit.    Time 12    Period Weeks    Status On-going    Target Date 06/30/20      PT LONG TERM GOAL #3   Title The patient will demonstrate lateral scooting  for assistance with transfers with minA to maximize independence.    Baseline 10/22/19: Pt requires modA+1 for  lateral scooting; 12/10/19: modA+1, 12/13: modA-maxA, 12/27: modA-maxA, 03/10/2020- ModA-MaxA- increased verbal cues and visual demonstration. 04/07/2020- Continued Max assist to perform forward/lateral scoot.    Time 12    Period Weeks    Status On-going    Target Date 06/30/20      PT LONG TERM GOAL #4   Title The patient will demonstrate a squat pivot transfer with minimum assistance to maximize independence and mobility.    Baseline 10/22/19: unable to  safely attempt at this time; 12/10/19: unable to safely attempt at this time, 01/14/20: unable to safety attempt at this time. 03/10/2020- Patient able to perform Stand pivot transfer with Maximal assist. 04/07/2020- Patient continues to require Max assist with sit to stand- will assist SPT next visit.    Time 12    Period Weeks    Status On-going    Target Date 06/30/20      PT LONG TERM GOAL #5   Title The patient will demonstrate sitting without UE support for 2-5 minutes at EOB to improve participation and maximize independence with ADLs.    Baseline 10/22/19: Pt able ot sit unsupported for at least 2 minutes at edge of bed    Time 12    Period Weeks    Status Achieved                 Plan - 04/14/20 2214    Clinical Impression Statement Patient struggled today with standing after attempting with wearing tennis shoe to see if she would stop sliding her foot forward with standing. The shoe was not helpful and patient unable to extend left knee requring max assist to stand and unable to maintain static stand > 10 sec today. She did perform well with seated posture and continues to require min-mod assist most of seated LE strengthening exercises.She will would benefit from continued PT services to increase mobility and strength to improve patient's quality of life.    Personal Factors and Comorbidities Age;Time since onset of injury/illness/exacerbation;Comorbidity 3+;Fitness    Comorbidities HTN, quadriparesis, history of DVT, neurogenic  bladder    Examination-Activity Limitations Bathing;Hygiene/Grooming;Squat;Bed Mobility;Lift;Bend;Stand;Engineer, manufacturing;Toileting;Self Feeding;Transfers;Continence;Sit;Dressing;Sleep;Carry    Examination-Participation Restrictions Church;Laundry;Cleaning;Medication Management;Community Activity;Meal Prep;Interpersonal Relationship    Stability/Clinical Decision Making Evolving/Moderate complexity    Rehab Potential Fair    PT Frequency 2x / week    PT Duration 12 weeks    PT Treatment/Interventions ADLs/Self Care Home Management;Electrical Stimulation;Therapeutic activities;Wheelchair mobility training;Vasopneumatic Device;Joint Manipulations;Vestibular;Passive range of motion;Patient/family education;Therapeutic exercise;DME Instruction;Biofeedback;Aquatic Therapy;Moist Heat;Gait training;Balance training;Orthotic Fit/Training;Dry needling;Energy conservation;Taping;Splinting;Neuromuscular re-education;Cryotherapy;Ultrasound;Functional mobility training    PT Next Visit Plan Continue with progressive Standing/transfer training.    PT Home Exercise Plan no changes    Consulted and Agree with Plan of Care Patient           Patient will benefit from skilled therapeutic intervention in order to improve the following deficits and impairments:  Abnormal gait,Decreased balance,Decreased endurance,Decreased mobility,Difficulty walking,Hypomobility,Impaired sensation,Decreased range of motion,Improper body mechanics,Impaired perceived functional ability,Decreased activity tolerance,Decreased knowledge of use of DME,Decreased safety awareness,Decreased strength,Impaired flexibility,Impaired UE functional use,Postural dysfunction  Visit Diagnosis: Muscle weakness (generalized)  Other lack of coordination  Other abnormalities of gait and mobility     Problem List Patient Active Problem List   Diagnosis Date Noted  . Acute appendicitis with appendiceal abscess 05/30/2018  .  Hypocalcemia 02/15/2018  . Dysuria 02/15/2018  . Bilateral lower extremity edema 02/11/2018  . Vaginal yeast infection 01/30/2018  . Chest tightness 01/27/2018  . Healthcare-associated pneumonia 01/25/2018  . Pleural effusion, not elsewhere classified 01/25/2018  . Acute deep vein thrombosis (DVT) of left lower extremity ([place]) 01/24/2018  . Acute deep vein thrombosis (DVT) of right lower extremity ([place]) 01/24/2018  . Chronic allergic rhinitis 01/24/2018  . Depression with anxiety 01/24/2018  . UTI due to Klebsiella species 01/24/2018  . Protein-calorie malnutrition, severe ([place]) 01/24/2018  . S/P insertion of IVC (inferior vena caval) filter 01/24/2018  . Reactive depression   . Hypokalemia   .  Leukocytosis   . Essential hypertension   . Trauma   . Tetraparesis ([place])   . Neuropathic pain   . Neurogenic bowel   . Neurogenic bladder   . Benign essential HTN   . Acute blood loss anemia   . Central cord syndrome at C6 level of cervical spinal cord ([place]) 11/29/2017  . Central cord syndrome [facility]) 11/29/2017    Lewis Moccasin, PT 04/14/2020, 10:20 PM  [place] MAIN [facility] SERVICES [address] [place], [place], [address] Phone: [phone]   Fax:  [phone]  Name: LEIANA RUND MRN: [id number] Date of Birth: Sep 02, 1934

## 2020-04-14 NOTE — Therapy (Signed)
[place] MAIN [facility] SERVICES [address] [place], [place], [address] Phone: [phone]   Fax:  [phone]  Occupational Therapy Treatment  Patient Details  Name: Sherry [name] MRN: [id number] Date of Birth: [birth year] No data recorded  Encounter Date: 04/14/2020   OT End of Session - 04/14/20 1217    Visit Number 43    Number of Visits 30    Date for OT Re-Evaluation 05/21/20    Authorization Time Period Progress report period starting 04/02/2020    OT Start Time 1100    OT Stop Time 1145    OT Time Calculation (min) 45 min    Equipment Utilized During Treatment moist heat    Activity Tolerance Patient tolerated treatment well    Behavior During Therapy [facility] for tasks assessed/performed           Past Medical History:  Diagnosis Date  . Acute blood loss anemia   . Arthritis   . Cancer ([place])    skin  . Central cord syndrome at C6 level of cervical spinal cord ([place]) 11/29/2017  . Hypertension   . Protein-calorie malnutrition, severe ([place]) 01/24/2018  . S/P insertion of IVC (inferior vena caval) filter 01/24/2018  . Tetraparesis [facility])     Past Surgical History:  Procedure Laterality Date  . ANTERIOR CERVICAL DECOMP/DISCECTOMY FUSION N/A 11/29/2017   Procedure: Cervical five-six, six-seven Anterior Cervical Decompression Fusion;  Surgeon: Judith Part, MD;  Location: [facility];  Service: Neurosurgery;  Laterality: N/A;  Cervical five-six, six-seven Anterior Cervical Decompression Fusion  . CATARACT EXTRACTION    . IR IVC FILTER PLMT / S&I /IMG GUID/MOD SED  12/08/2017    There were no vitals filed for this visit.   Subjective Assessment - 04/14/20 1216    Subjective  Pt. reports that she is doing well today    Patient is accompanied by: Family member    Pertinent History Patient s/p fall November 29, 2017 resulting in diagnosis of central cord syndrome at C 6 level.  She has had therapy in multiple venues but with recent  move to [facility], therapy staff recommended she seek outpatient therapy for her needs.    Currently in Pain? No/denies          OT Treatment  Pt.tolerated bilateral shoulder flexion, extension, abduction, elbow flexion, extension forearm supination AAROM/AROM. PROM bilateral wrist extension, digit MP, PIP, and DIP flexion, and extension in conjunction with moist heat.  Pt. Reports that her right shoulder soreness is improved today.  Pt. continues to make progress overall with improving BUE ROM, and is tolerating ROM well. Pt. continues to progress towards formulating a fist. Pt. Has improved with writing, and writing legibility.Pt.continues to engageher right hand whenperforming self-feeding with adpativeequipment, and is improving with UE dressing tasks.Pt.Continues to beable to use her right thumb to hold items. Pt. tolerated ROM well without reports of pain, or discomfort. Pt.continues to benefit from working on improving ROM in order to work towards increasing engagement of her bilateral hands during ADLs, and IADL tasks.                        OT Education - 04/14/20 1217    Education Details BUE ROM with functional reaching    Person(s) Educated Patient;Caregiver(s)    Methods Explanation;Demonstration    Comprehension Verbalized understanding;Need further instruction               OT Long Term Goals -  04/02/20 1549      OT LONG TERM GOAL #1   Title Patient and caregiver will demonstrate understanding of home exercise program for ROM.    Baseline Pt. continues to have a restorative aide rehab aide assist her wih exercises at [facility]    Time 12    Period Weeks    Status On-going    Target Date 05/21/20      OT LONG TERM GOAL #2   Title Patient will demonstrate ability to don shirt with min assist from seated position.    Baseline Pt. is indepedent with a larger shirt, and continues to require increased time to complete    Time  12    Period Weeks    Status On-going    Target Date 05/21/20      OT LONG TERM GOAL #4   Title Patient will demonstrate improved composite finger flexion to hold deodorant to apply to underarms.    Baseline Pt. continues to present with improving finger flexion of R hand for a partial gross grasp, but unable to grasp and hold items. Very limited digit flexion of L hand. Pt. has active left thumb abduction.    Time 12    Period Weeks    Status On-going    Target Date 05/21/20      OT LONG TERM GOAL #6   Title Patient will increase right UE ROM to comb the back of her hair with modified independence.    Baseline Pt. continues to be able to comb the front, and sides of her head. Pt. can now reach the back, however cannot see what she is doing in the back of her head.    Time 12    Period Weeks    Status Partially Met    Target Date 05/21/20      OT LONG TERM GOAL #8   Title Pt. will write, and sign her name with 100% legibility, and modified independence.    Baseline Pt. conitnues to  consistently filling out her daily menu, and writing for crossword puzzles. Pt. is able to maintain grasp on a wide width pen. [place] to work on [employer].    Time 12    Period Weeks    Status On-going    Target Date 05/21/20                 Plan - 04/14/20 1217    Clinical Impression Statement Pt. Reports that her right shoulder soreness is improved today.  Pt. continues to make progress overall with improving BUE ROM, and is tolerating ROM well. Pt. continues to progress towards formulating a fist. Pt. Has improved with writing, and writing legibility.Pt.continues to engageher right hand whenperforming self-feeding with adpativeequipment, and is improving with UE dressing tasks.Pt.Continues to beable to use her right thumb to hold items. Pt. tolerated ROM well without reports of pain, or discomfort. Pt.continues to benefit from working on improving ROM in order to work  towards increasing engagement of her bilateral hands during ADLs, and IADL tasks.   OT Occupational Profile and History Detailed Assessment- Review of Records and additional review of physical, cognitive, psychosocial history related to current functional performance    Occupational performance deficits (Please refer to evaluation for details): ADL's;IADL's;Leisure;Social Participation    Body Structure / Function / Physical Skills ADL;Continence;Dexterity;Flexibility;Strength;ROM;Balance;Coordination;FMC;IADL;Endurance;UE functional use;Decreased knowledge of use of DME;GMC    Psychosocial Skills Environmental  Adaptations;Habits;Routines and Behaviors    Rehab Potential Fair    Clinical Decision  Making Limited treatment options, no task modification necessary    Comorbidities Affecting Occupational Performance: Presence of comorbidities impacting occupational performance    Modification or Assistance to Complete Evaluation  No modification of tasks or assist necessary to complete eval    OT Frequency 2x / week    OT Duration 12 weeks    OT Treatment/Interventions Self-care/ADL training;Cryotherapy;Therapeutic exercise;DME and/or AE instruction;Balance training;Neuromuscular education;Manual Therapy;Splinting;Moist Heat;Passive range of motion;Therapeutic activities;Patient/family education    Consulted and Agree with Plan of Care Patient           Patient will benefit from skilled therapeutic intervention in order to improve the following deficits and impairments:   Body Structure / Function / Physical Skills: ADL,Continence,Dexterity,Flexibility,Strength,ROM,Balance,Coordination,FMC,IADL,Endurance,UE functional use,Decreased knowledge of use of [facility]   Psychosocial Skills: Environmental  Adaptations,Habits,Routines and Behaviors   Visit Diagnosis: Muscle weakness (generalized)  Other lack of coordination    Problem List Patient Active Problem List   Diagnosis Date Noted  .  Acute appendicitis with appendiceal abscess 05/30/2018  . Hypocalcemia 02/15/2018  . Dysuria 02/15/2018  . Bilateral lower extremity edema 02/11/2018  . Vaginal yeast infection 01/30/2018  . Chest tightness 01/27/2018  . Healthcare-associated pneumonia 01/25/2018  . Pleural effusion, not elsewhere classified 01/25/2018  . Acute deep vein thrombosis (DVT) of left lower extremity ([place]) 01/24/2018  . Acute deep vein thrombosis (DVT) of right lower extremity ([place]) 01/24/2018  . Chronic allergic rhinitis 01/24/2018  . Depression with anxiety 01/24/2018  . UTI due to Klebsiella species 01/24/2018  . Protein-calorie malnutrition, severe ([place]) 01/24/2018  . S/P insertion of IVC (inferior vena caval) filter 01/24/2018  . Reactive depression   . Hypokalemia   . Leukocytosis   . Essential hypertension   . Trauma   . Tetraparesis ([place])   . Neuropathic pain   . Neurogenic bowel   . Neurogenic bladder   . Benign essential HTN   . Acute blood loss anemia   . Central cord syndrome at C6 level of cervical spinal cord ([place]) 11/29/2017  . Central cord syndrome [facility]) 11/29/2017    Harrel Carina, MS, OTR/L 04/14/2020, 12:19 PM  [place] MAIN [facility] SERVICES [address] [place], [place], [address] Phone: [phone]   Fax:  [phone]  Name: DAUNE DIVIRGILIO MRN: [id number] Date of Birth: 14-Sep-1934

## 2020-04-16 ENCOUNTER — Ambulatory Visit: Payer: Medicare HMO | Admitting: Occupational Therapy

## 2020-04-16 ENCOUNTER — Other Ambulatory Visit: Payer: Self-pay

## 2020-04-16 ENCOUNTER — Ambulatory Visit: Payer: Medicare HMO

## 2020-04-16 DIAGNOSIS — R2689 Other abnormalities of gait and mobility: Secondary | ICD-10-CM

## 2020-04-16 DIAGNOSIS — R278 Other lack of coordination: Secondary | ICD-10-CM

## 2020-04-16 DIAGNOSIS — M6281 Muscle weakness (generalized): Secondary | ICD-10-CM

## 2020-04-16 NOTE — Therapy (Signed)
[place] MAIN [facility] SERVICES [address] [place], [place], [address] Phone: [phone]   Fax:  [phone]  Physical Therapy Treatment  Patient Details  Name: Sherry [name] MRN: [id number] Date of Birth: 1934/02/16 Referring Provider (PT): Viviana Simpler   Encounter Date: 04/16/2020   PT End of Session - 04/16/20 1055    Visit Number 49    Number of Visits 79    Date for PT Re-Evaluation 06/30/20    PT Start Time 1601    PT Stop Time 1220    PT Time Calculation (min) 35 min    Equipment Utilized During Treatment Gait belt    Activity Tolerance Patient tolerated treatment well;Patient limited by fatigue    Behavior During Therapy [facility] for tasks assessed/performed           Past Medical History:  Diagnosis Date  . Acute blood loss anemia   . Arthritis   . Cancer ([place])    skin  . Central cord syndrome at C6 level of cervical spinal cord ([place]) 11/29/2017  . Hypertension   . Protein-calorie malnutrition, severe ([place]) 01/24/2018  . S/P insertion of IVC (inferior vena caval) filter 01/24/2018  . Tetraparesis [facility])     Past Surgical History:  Procedure Laterality Date  . ANTERIOR CERVICAL DECOMP/DISCECTOMY FUSION N/A 11/29/2017   Procedure: Cervical five-six, six-seven Anterior Cervical Decompression Fusion;  Surgeon: Judith Part, MD;  Location: [place];  Service: Neurosurgery;  Laterality: N/A;  Cervical five-six, six-seven Anterior Cervical Decompression Fusion  . CATARACT EXTRACTION    . IR IVC FILTER PLMT / S&I /IMG GUID/MOD SED  12/08/2017    There were no vitals filed for this visit.   Subjective Assessment - 04/16/20 1055    Subjective Patient denies any pain and states doing well today. She reports she is back in her normal foot cast shoe secondary to increased difficulty with standing last visit.    Patient is accompained by: --   [name] (aide)   Pertinent History Pt is an 86 yo female that fell in 2019,  fractured vertebrae in her neck and in her low back per family. 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 After hospital stay, pt discharged to [facility] for ~1 month, experienced severe UTI as well as bilateral DVT (IVC filter placed). Discharged to [facility], stayed for about a year, and then moved to [facility] in April 2021. Was receiving PT, but per family facility reported that did not have adequate equipment to maximize PT for her. Pt until about 1 month ago was practicing sit to stand transfers with 1-2 people, and working on static standing. Has a brace for L foot due to PF. Pt currently needs assistance with all ADLs (able to assist with donning/doffing her shirt), bed baths, and needs a hoyer lift for transfers. Able to drive power wheelchair without assistance.    Limitations Standing;Walking;House hold activities;Lifting    How long can you sit comfortably? NA    How long can you stand comfortably? unable    How long can you walk comfortably? unable    Patient Stated Goals "to stand up and walk"    Currently in Pain? No/denies    Pain Onset 1 to 4 weeks ago               Neuromuscular re-education:  Patient performed 4 trials of static standing in //bars-  1) 2 min 03 sec with Max A sit to stand stand  and position hips/trunk forward - Patient able to stand yet requires intermittent VC/TC to extend hips to place weight more centered. Patient with some difficulty maintaining.  2) 1 min 35 sec -    3)  1 min 50 sec-  Patient able to perform more Lateral weight shifting side to side without any knee buckling.  4) 1 min 4 sec -  Continued VCs and min assistance - decreased time due to patient fatigue.  5) standing up long enough to adjust hoyer pad underneath her seat  Patient requires Max assist to stand and min/mod A to remain in static/dynamic standing throughout session. She is able to respond to VC/TCs for improved posture with standing today.                         PT Education - 04/16/20 1307    Education Details Postural and transfer cues for safety and best performance.    Person(s) Educated Patient    Methods Explanation;Demonstration;Tactile cues;Verbal cues    Comprehension Verbalized understanding;Returned demonstration;Verbal cues required;Tactile cues required            PT Short Term Goals - 03/10/20 2211      PT SHORT TERM GOAL #1   Title The patient will perform initial HEP with minimum assistance in order to improve strength and function.    Baseline Patient demonstrating independence with initial HEP as of 03/10/2020 with no questions or difficulty.    Time 4    Period Weeks    Status Achieved    Target Date 02/11/20             PT Long Term Goals - 04/07/20 1155      PT LONG TERM GOAL #1   Title The patient will be compliant with finalized HEP with minimum assistance in preparation for self management and maintenance of condition.    Baseline 03/10/2020- Patient familiar with initial HEP and will keep goal active as HEP becomes more progressive including possible transfers and standing activities. 04/07/2020- Patient continues to report compliance with LE strengthening home exercises without questions or concerns.    Time 12    Period Weeks    Status On-going    Target Date 06/30/20      PT LONG TERM GOAL #2   Title The patient will demonstrate at least 10 point improvement on FOTO score indicating an improved ability to perform functional activities.    Baseline on eval (/720) score 12; 10/22/19: 12; 12/10/19: 18, 12/13: 18: 04/07/2020- Will assess next visit.    Time 12    Period Weeks    Status On-going    Target Date 06/30/20      PT LONG TERM GOAL #3   Title The patient will demonstrate lateral scooting  for assistance with transfers with minA to maximize independence.    Baseline 10/22/19: Pt requires modA+1 for lateral scooting; 12/10/19: modA+1, 12/13: modA-maxA, 12/27:  modA-maxA, 03/10/2020- ModA-MaxA- increased verbal cues and visual demonstration. 04/07/2020- Continued Max assist to perform forward/lateral scoot.    Time 12    Period Weeks    Status On-going    Target Date 06/30/20      PT LONG TERM GOAL #4   Title The patient will demonstrate a squat pivot transfer with minimum assistance to maximize independence and mobility.    Baseline 10/22/19: unable to safely attempt at this time; 12/10/19: unable to safely attempt at this time, 01/14/20: unable to safety attempt  at this time. 03/10/2020- Patient able to perform Stand pivot transfer with Maximal assist. 04/07/2020- Patient continues to require Max assist with sit to stand- will assist SPT next visit.    Time 12    Period Weeks    Status On-going    Target Date 06/30/20      PT LONG TERM GOAL #5   Title The patient will demonstrate sitting without UE support for 2-5 minutes at EOB to improve participation and maximize independence with ADLs.    Baseline 10/22/19: Pt able ot sit unsupported for at least 2 minutes at edge of bed    Time 12    Period Weeks    Status Achieved                 Plan - 04/16/20 1056    Clinical Impression Statement Patient demo improved overall standing endurance today provided Min/mod A and intermittent VC's and TCs for posture. She experienced her personal best time in standing and significantly more than previous 2 visits. She will would benefit from continued PT services to increase mobility and strength to improve patient's quality of life.    Personal Factors and Comorbidities Age;Time since onset of injury/illness/exacerbation;Comorbidity 3+;Fitness    Comorbidities HTN, quadriparesis, history of DVT, neurogenic bladder    Examination-Activity Limitations Bathing;Hygiene/Grooming;Squat;Bed Mobility;Lift;Bend;Stand;Engineer, manufacturing;Toileting;Self Feeding;Transfers;Continence;Sit;Dressing;Sleep;Carry    Examination-Participation Restrictions  Church;Laundry;Cleaning;Medication Management;Community Activity;Meal Prep;Interpersonal Relationship    Stability/Clinical Decision Making Evolving/Moderate complexity    Rehab Potential Fair    PT Frequency 2x / week    PT Duration 12 weeks    PT Treatment/Interventions ADLs/Self Care Home Management;Electrical Stimulation;Therapeutic activities;Wheelchair mobility training;Vasopneumatic Device;Joint Manipulations;Vestibular;Passive range of motion;Patient/family education;Therapeutic exercise;DME Instruction;Biofeedback;Aquatic Therapy;Moist Heat;Gait training;Balance training;Orthotic Fit/Training;Dry needling;Energy conservation;Taping;Splinting;Neuromuscular re-education;Cryotherapy;Ultrasound;Functional mobility training    PT Next Visit Plan Continue with progressive Standing/transfer training.    PT Home Exercise Plan no changes    Consulted and Agree with Plan of Care Patient           Patient will benefit from skilled therapeutic intervention in order to improve the following deficits and impairments:  Abnormal gait,Decreased balance,Decreased endurance,Decreased mobility,Difficulty walking,Hypomobility,Impaired sensation,Decreased range of motion,Improper body mechanics,Impaired perceived functional ability,Decreased activity tolerance,Decreased knowledge of use of DME,Decreased safety awareness,Decreased strength,Impaired flexibility,Impaired UE functional use,Postural dysfunction  Visit Diagnosis: Muscle weakness (generalized)  Other lack of coordination  Other abnormalities of gait and mobility     Problem List Patient Active Problem List   Diagnosis Date Noted  . Acute appendicitis with appendiceal abscess 05/30/2018  . Hypocalcemia 02/15/2018  . Dysuria 02/15/2018  . Bilateral lower extremity edema 02/11/2018  . Vaginal yeast infection 01/30/2018  . Chest tightness 01/27/2018  . Healthcare-associated pneumonia 01/25/2018  . Pleural effusion, not elsewhere  classified 01/25/2018  . Acute deep vein thrombosis (DVT) of left lower extremity ([place]) 01/24/2018  . Acute deep vein thrombosis (DVT) of right lower extremity ([place]) 01/24/2018  . Chronic allergic rhinitis 01/24/2018  . Depression with anxiety 01/24/2018  . UTI due to Klebsiella species 01/24/2018  . Protein-calorie malnutrition, severe ([place]) 01/24/2018  . S/P insertion of IVC (inferior vena caval) filter 01/24/2018  . Reactive depression   . Hypokalemia   . Leukocytosis   . Essential hypertension   . Trauma   . Tetraparesis ([place])   . Neuropathic pain   . Neurogenic bowel   . Neurogenic bladder   . Benign essential HTN   . Acute blood loss anemia   . Central cord syndrome at C6 level of cervical  spinal cord ([place]) 11/29/2017  . Central cord syndrome [facility]) 11/29/2017    Lewis Moccasin, PT 04/16/2020, 1:10 PM  [place] MAIN [facility] SERVICES [address] [place], [place], [address] Phone: [phone]   Fax:  [phone]  Name: Sherry [name] MRN: [id number] Date of Birth: 1934/08/25

## 2020-04-17 ENCOUNTER — Encounter: Payer: Self-pay | Admitting: Occupational Therapy

## 2020-04-17 NOTE — Therapy (Signed)
[place] MAIN [facility] SERVICES [address] [place], [place], [address] Phone: [phone]   Fax:  [phone]  Occupational Therapy Treatment  Patient Details  Name: Sherry [name] MRN: [id number] Date of Birth: 1934/08/05 No data recorded  Encounter Date: 04/16/2020   OT End of Session - 04/17/20 1152    Visit Number 36    Number of Visits 51    Date for OT Re-Evaluation 05/21/20    Authorization Time Period Progress report period starting 04/02/2020    OT Start Time 1100    OT Stop Time 1145    OT Time Calculation (min) 45 min    Equipment Utilized During Treatment moist heat    Activity Tolerance Patient tolerated treatment well    Behavior During Therapy [facility] for tasks assessed/performed           Past Medical History:  Diagnosis Date  . Acute blood loss anemia   . Arthritis   . Cancer ([place])    skin  . Central cord syndrome at C6 level of cervical spinal cord ([place]) 11/29/2017  . Hypertension   . Protein-calorie malnutrition, severe ([place]) 01/24/2018  . S/P insertion of IVC (inferior vena caval) filter 01/24/2018  . Tetraparesis [facility])     Past Surgical History:  Procedure Laterality Date  . ANTERIOR CERVICAL DECOMP/DISCECTOMY FUSION N/A 11/29/2017   Procedure: Cervical five-six, six-seven Anterior Cervical Decompression Fusion;  Surgeon: Judith Part, MD;  Location: [place];  Service: Neurosurgery;  Laterality: N/A;  Cervical five-six, six-seven Anterior Cervical Decompression Fusion  . CATARACT EXTRACTION    . IR IVC FILTER PLMT / S&I /IMG GUID/MOD SED  12/08/2017    There were no vitals filed for this visit.   Subjective Assessment - 04/17/20 1152    Subjective  Pt. reports that she is doing well today    Patient is accompanied by: Family member    Pertinent History Patient s/p fall November 29, 2017 resulting in diagnosis of central cord syndrome at C 6 level.  She has had therapy in multiple venues but with recent  move to [facility], therapy staff recommended she seek outpatient therapy for her needs.    Currently in Pain? No/denies          OT Treatment  Pt.tolerated bilateral shoulder flexion, extension, abduction, elbow flexion, extension forearm supination AAROM/AROM. PROM bilateral wrist extension, digit MP, PIP, and DIP flexion, and extension in conjunction with moist heat.Pt. Worked on [place] in preparation for functional reaching.  Pt. Reports no right shoulder soreness, or discomfort today. Pt.continues to make progress overallwith improving BUE ROM, and is tolerating ROM well. Pt. continues to progress towards formulating a fist. Pt. to improve with writing, and writing legibility.Pt.continues to engageher right hand whenperforming self-feeding with adpativeequipment, and is improving with UE dressing tasks.Pt.Continues to beable to use her right thumb to hold items. Pt. tolerated ROM well without reports of pain, or discomfort. Pt.continues to benefit from working on improving ROM in order to work towards increasing engagement of her bilateral hands during ADLs, and IADL tasks.            OT Education - 04/14/20 1217                            OT Education - 04/17/20 1152    Education Details BUE ROM with functional reaching    Person(s) Educated Patient;Caregiver(s)    Methods Explanation;Demonstration  Comprehension Verbalized understanding;Need further instruction               OT Long Term Goals - 04/02/20 1549      OT LONG TERM GOAL #1   Title Patient and caregiver will demonstrate understanding of home exercise program for ROM.    Baseline Pt. continues to have a restorative aide rehab aide assist her wih exercises at [facility]    Time 12    Period Weeks    Status On-going    Target Date 05/21/20      OT LONG TERM GOAL #2   Title Patient will demonstrate ability to don shirt with min assist from seated position.     Baseline Pt. is indepedent with a larger shirt, and continues to require increased time to complete    Time 12    Period Weeks    Status On-going    Target Date 05/21/20      OT LONG TERM GOAL #4   Title Patient will demonstrate improved composite finger flexion to hold deodorant to apply to underarms.    Baseline Pt. continues to present with improving finger flexion of R hand for a partial gross grasp, but unable to grasp and hold items. Very limited digit flexion of L hand. Pt. has active left thumb abduction.    Time 12    Period Weeks    Status On-going    Target Date 05/21/20      OT LONG TERM GOAL #6   Title Patient will increase right UE ROM to comb the back of her hair with modified independence.    Baseline Pt. continues to be able to comb the front, and sides of her head. Pt. can now reach the back, however cannot see what she is doing in the back of her head.    Time 12    Period Weeks    Status Partially Met    Target Date 05/21/20      OT LONG TERM GOAL #8   Title Pt. will write, and sign her name with 100% legibility, and modified independence.    Baseline Pt. conitnues to  consistently filling out her daily menu, and writing for crossword puzzles. Pt. is able to maintain grasp on a wide width pen. Contineus to work on [employer].    Time 12    Period Weeks    Status On-going    Target Date 05/21/20                 Plan - 04/17/20 1153    Clinical Impression Statement Pt. Reports no right shoulder soreness, or discomfort today. Pt.continues to make progress overallwith improving BUE ROM, and is tolerating ROM well. Pt. continues to progress towards formulating a fist. Pt. to improve with writing, and writing legibility.Pt.continues to engageher right hand whenperforming self-feeding with adpativeequipment, and is improving with UE dressing tasks.Pt.Continues to beable to use her right thumb to hold items. Pt. tolerated ROM well without  reports of pain, or discomfort. Pt.continues to benefit from working on improving ROM in order to work towards increasing engagement of her bilateral hands during ADLs, and IADL tasks.   OT Occupational Profile and History Detailed Assessment- Review of Records and additional review of physical, cognitive, psychosocial history related to current functional performance    Occupational performance deficits (Please refer to evaluation for details): ADL's;IADL's;Leisure;Social Participation    Body Structure / Function / Physical Skills ADL;Continence;Dexterity;Flexibility;Strength;ROM;Balance;Coordination;FMC;IADL;Endurance;UE functional use;Decreased knowledge of use of DME;GMC  Psychosocial Skills Environmental  Adaptations;Habits;Routines and Behaviors    Rehab Potential Fair    Clinical Decision Making Limited treatment options, no task modification necessary    Comorbidities Affecting Occupational Performance: Presence of comorbidities impacting occupational performance    Comorbidities impacting occupational performance description: contractures of bilateral hands, dependent transfers,    Modification or Assistance to Complete Evaluation  No modification of tasks or assist necessary to complete eval    OT Frequency 2x / week    OT Duration 12 weeks    OT Treatment/Interventions Self-care/ADL training;Cryotherapy;Therapeutic exercise;DME and/or AE instruction;Balance training;Neuromuscular education;Manual Therapy;Splinting;Moist Heat;Passive range of motion;Therapeutic activities;Patient/family education    Plan continue to progress ROM of digits, wrists and shoulders as it pertains to completion of ADL tasks that pt values.    Consulted and Agree with Plan of Care Patient           Patient will benefit from skilled therapeutic intervention in order to improve the following deficits and impairments:   Body Structure / Function / Physical Skills:  ADL,Continence,Dexterity,Flexibility,Strength,ROM,Balance,Coordination,FMC,IADL,Endurance,UE functional use,Decreased knowledge of use of [facility]   Psychosocial Skills: Environmental  Adaptations,Habits,Routines and Behaviors   Visit Diagnosis: Muscle weakness (generalized)    Problem List Patient Active Problem List   Diagnosis Date Noted  . Acute appendicitis with appendiceal abscess 05/30/2018  . Hypocalcemia 02/15/2018  . Dysuria 02/15/2018  . Bilateral lower extremity edema 02/11/2018  . Vaginal yeast infection 01/30/2018  . Chest tightness 01/27/2018  . Healthcare-associated pneumonia 01/25/2018  . Pleural effusion, not elsewhere classified 01/25/2018  . Acute deep vein thrombosis (DVT) of left lower extremity ([place]) 01/24/2018  . Acute deep vein thrombosis (DVT) of right lower extremity ([place]) 01/24/2018  . Chronic allergic rhinitis 01/24/2018  . Depression with anxiety 01/24/2018  . UTI due to Klebsiella species 01/24/2018  . Protein-calorie malnutrition, severe ([place]) 01/24/2018  . S/P insertion of IVC (inferior vena caval) filter 01/24/2018  . Reactive depression   . Hypokalemia   . Leukocytosis   . Essential hypertension   . Trauma   . Tetraparesis ([place])   . Neuropathic pain   . Neurogenic bowel   . Neurogenic bladder   . Benign essential HTN   . Acute blood loss anemia   . Central cord syndrome at C6 level of cervical spinal cord ([place]) 11/29/2017  . Central cord syndrome [facility]) 11/29/2017    Harrel Carina, MS, OTR/L 04/17/2020, 11:54 AM  [place] MAIN [facility] SERVICES [address] [place], [place], [address] Phone: [phone]   Fax:  [phone]  Name: Sherry [name] MRN: [id number] Date of Birth: [birth year]

## 2020-04-21 ENCOUNTER — Encounter: Payer: Self-pay | Admitting: Occupational Therapy

## 2020-04-21 ENCOUNTER — Other Ambulatory Visit: Payer: Self-pay

## 2020-04-21 ENCOUNTER — Ambulatory Visit: Payer: Medicare HMO | Admitting: Occupational Therapy

## 2020-04-21 ENCOUNTER — Ambulatory Visit: Payer: Medicare HMO

## 2020-04-21 DIAGNOSIS — R2689 Other abnormalities of gait and mobility: Secondary | ICD-10-CM

## 2020-04-21 DIAGNOSIS — R278 Other lack of coordination: Secondary | ICD-10-CM

## 2020-04-21 DIAGNOSIS — M6281 Muscle weakness (generalized): Secondary | ICD-10-CM | POA: Diagnosis not present

## 2020-04-21 NOTE — Therapy (Signed)
[place] MAIN [facility] SERVICES [address] [place], [place], [address] Phone: [phone]   Fax:  [phone]  Physical Therapy Treatment/Physical Therapy Progress Note   Dates of reporting period  03/10/2020 to   04/21/2020  Patient Details  Name: Sherry [name] MRN: [id number] Date of Birth: 08-Jan-1935 Referring Provider (PT): Viviana Simpler   Encounter Date: 04/21/2020   PT End of Session - 04/21/20 1245    Visit Number 50    Number of Visits 59    Date for PT Re-Evaluation 06/30/20    PT Start Time 8299    PT Stop Time 1217    PT Time Calculation (min) 32 min    Equipment Utilized During Treatment Gait belt    Activity Tolerance Patient tolerated treatment well;Patient limited by fatigue    Behavior During Therapy [facility] for tasks assessed/performed           Past Medical History:  Diagnosis Date  . Acute blood loss anemia   . Arthritis   . Cancer ([place])    skin  . Central cord syndrome at C6 level of cervical spinal cord ([place]) 11/29/2017  . Hypertension   . Protein-calorie malnutrition, severe ([place]) 01/24/2018  . S/P insertion of IVC (inferior vena caval) filter 01/24/2018  . Tetraparesis [facility])     Past Surgical History:  Procedure Laterality Date  . ANTERIOR CERVICAL DECOMP/DISCECTOMY FUSION N/A 11/29/2017   Procedure: Cervical five-six, six-seven Anterior Cervical Decompression Fusion;  Surgeon: Judith Part, MD;  Location: [place];  Service: Neurosurgery;  Laterality: N/A;  Cervical five-six, six-seven Anterior Cervical Decompression Fusion  . CATARACT EXTRACTION    . IR IVC FILTER PLMT / S&I /IMG GUID/MOD SED  12/08/2017    There were no vitals filed for this visit.   Subjective Assessment - 04/21/20 1243    Subjective Patient reports being tired and stated she did not sleep well.    Patient is accompained by: --   Sherry [name] (aide)   Pertinent History Pt is an 86 yo female that fell in 2019, fractured vertebrae  in her neck and in her low back per family. 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 After hospital stay, pt discharged to [facility] for ~1 month, experienced severe UTI as well as bilateral DVT (IVC filter placed). Discharged to [facility], stayed for about a year, and then moved to [facility] in April 2021. Was receiving PT, but per family facility reported that did not have adequate equipment to maximize PT for her. Pt until about 1 month ago was practicing sit to stand transfers with 1-2 people, and working on static standing. Has a brace for L foot due to PF. Pt currently needs assistance with all ADLs (able to assist with donning/doffing her shirt), bed baths, and needs a hoyer lift for transfers. Able to drive power wheelchair without assistance.    Limitations Standing;Walking;House hold activities;Lifting    How long can you sit comfortably? NA    How long can you stand comfortably? unable    How long can you walk comfortably? unable    Patient Stated Goals "to stand up and walk"    Currently in Pain? No/denies    Pain Onset 1 to 4 weeks ago             Reassessed goals today: (See goal section for details)  Reviewed in detail current home program and patient performed the following:   Seated hip march Seated hip abd/add Seated  Knee ext Seated quad sets (with legs elevated) - 5 sec hold Seated gluteal sets- hold 5 sec 10 reps each leg- Patient initially begins with AROM yet requries more AAROM with right LE and even L LE with marching to achieve completed reps.   Patient fatigued after transfers, scooting and then exercises so patient requested to stop.  Education provided throughout session via VC/TC and demonstration to facilitate movement at target joints and correct muscle activation for all testing and exercises performed.      Clinical Impression: Patient presents with some improvement overall- able to sit > 5 min at edge of mat without back support.  Patient was able to laterally scoot minimally with just verbal cues yet fatigues very quickly and unable to achieve more than 12 in of scoot left to right or right to left. Patient did achieve maximal assist with sit to stand and also with a stand pivot transfer today- able to position legs with assist to avoid knee or hip injury today yet unable to pivot or move Left LE in transfer as of yet. Patient's condition has the potential to improve in response to therapy. Maximum improvement is yet to be obtained. The anticipated improvement is attainable and reasonable in a generally predictable time.                   PT Education - 04/21/20 1244    Education Details Transfer cues and exercise technique.            PT Short Term Goals - 03/10/20 2211      PT SHORT TERM GOAL #1   Title The patient will perform initial HEP with minimum assistance in order to improve strength and function.    Baseline Patient demonstrating independence with initial HEP as of 03/10/2020 with no questions or difficulty.    Time 4    Period Weeks    Status Achieved    Target Date 02/11/20             PT Long Term Goals - 04/21/20 1301      PT LONG TERM GOAL #1   Title The patient will be compliant with finalized HEP with minimum assistance in preparation for self management and maintenance of condition.    Baseline 03/10/2020- Patient familiar with initial HEP and will keep goal active as HEP becomes more progressive including possible transfers and standing activities. 04/07/2020- Patient continues to report compliance with LE strengthening home exercises without questions or concerns. 04/21/2020- Patient able to verbalize and demonstrate good understanding of current HEP including some supine and seated LE strengthening exercises. Reviewed and patient performed 10 reps today with minimal cueing. Will keep goal active to progress HEP as appropriate.    Time 12    Period Weeks    Status On-going     Target Date 06/30/20      PT LONG TERM GOAL #2   Title The patient will demonstrate at least 10 point improvement on FOTO score indicating an improved ability to perform functional activities.    Baseline on eval (/720) score 12; 10/22/19: 12; 12/10/19: 18, 12/13: 18: 04/07/2020- Will assess next visit.    Time 12    Period Weeks    Status On-going    Target Date 06/30/20      PT LONG TERM GOAL #3   Title The patient will demonstrate lateral scooting  for assistance with transfers with minA to maximize independence.    Baseline 10/22/19: Pt requires modA+1  for lateral scooting; 12/10/19: modA+1, 12/13: modA-maxA, 12/27: modA-maxA, 03/10/2020- ModA-MaxA- increased verbal cues and visual demonstration. 04/07/2020- Continued Max assist to perform forward/lateral scoot. 04/21/2020- Patient was able to demo slight lateral scoot with CGA approx 12 in then fatigued requiring max assist- she is able to scoot forward by leaning without significant issues.    Time 12    Period Weeks    Status On-going    Target Date 06/30/20      PT LONG TERM GOAL #4   Title The patient will demonstrate a squat pivot transfer with minimum assistance to maximize independence and mobility.    Baseline 10/22/19: unable to safely attempt at this time; 12/10/19: unable to safely attempt at this time, 01/14/20: unable to safety attempt at this time. 03/10/2020- Patient able to perform Stand pivot transfer with Maximal assist. 04/07/2020- Patient continues to require Max assist with sit to stand- will assist SPT next visit. 04/21/2020- Patient presents with Max assist to perform Sit to stand Transfer- unable to pivot or move Left LE well without difficulty.    Time 12    Period Weeks    Status On-going    Target Date 06/30/20      PT LONG TERM GOAL #5   Title The patient will demonstrate sitting without UE support for 2-5 minutes at EOB to improve participation and maximize independence with ADLs.    Baseline 10/22/19: Pt able ot sit  unsupported for at least 2 minutes at edge of bed. 04/21/2020- Patient continues to demo good sitting at edge of mat > 5 miin without difficulty or back support.    Time 12    Period Weeks    Status Achieved                 Plan - 04/21/20 1258    Clinical Impression Statement Patient presents with some improvement overall- able to sit > 5 min at edge of mat without back support. Patient was able to laterally scoot minimally with just verbal cues yet fatigues very quickly and unable to achieve more than 12 in of scoot left to right or right to left. Patient did achieve maximal assist with sit to stand and also with a stand pivot transfer today- able to position legs with assist to avoid knee or hip injury today yet unable to pivot or move Left LE in transfer as of yet. Patient's condition has the potential to improve in response to therapy. Maximum improvement is yet to be obtained. The anticipated improvement is attainable and reasonable in a generally predictable time.    Personal Factors and Comorbidities Age;Time since onset of injury/illness/exacerbation;Comorbidity 3+;Fitness    Comorbidities HTN, quadriparesis, history of DVT, neurogenic bladder    Examination-Activity Limitations Bathing;Hygiene/Grooming;Squat;Bed Mobility;Lift;Bend;Stand;Engineer, manufacturing;Toileting;Self Feeding;Transfers;Continence;Sit;Dressing;Sleep;Carry    Examination-Participation Restrictions Church;Laundry;Cleaning;Medication Management;Community Activity;Meal Prep;Interpersonal Relationship    Stability/Clinical Decision Making Evolving/Moderate complexity    Rehab Potential Fair    PT Frequency 2x / week    PT Duration 12 weeks    PT Treatment/Interventions ADLs/Self Care Home Management;Electrical Stimulation;Therapeutic activities;Wheelchair mobility training;Vasopneumatic Device;Joint Manipulations;Vestibular;Passive range of motion;Patient/family education;Therapeutic exercise;DME  Instruction;Biofeedback;Aquatic Therapy;Moist Heat;Gait training;Balance training;Orthotic Fit/Training;Dry needling;Energy conservation;Taping;Splinting;Neuromuscular re-education;Cryotherapy;Ultrasound;Functional mobility training    PT Next Visit Plan Continue with progressive Standing/transfer training.    PT Home Exercise Plan no changes    Consulted and Agree with Plan of Care Patient           Patient will benefit from skilled therapeutic intervention in order to improve the  following deficits and impairments:  Abnormal gait,Decreased balance,Decreased endurance,Decreased mobility,Difficulty walking,Hypomobility,Impaired sensation,Decreased range of motion,Improper body mechanics,Impaired perceived functional ability,Decreased activity tolerance,Decreased knowledge of use of DME,Decreased safety awareness,Decreased strength,Impaired flexibility,Impaired UE functional use,Postural dysfunction  Visit Diagnosis: Muscle weakness (generalized)  Other lack of coordination  Other abnormalities of gait and mobility     Problem List Patient Active Problem List   Diagnosis Date Noted  . Acute appendicitis with appendiceal abscess 05/30/2018  . Hypocalcemia 02/15/2018  . Dysuria 02/15/2018  . Bilateral lower extremity edema 02/11/2018  . Vaginal yeast infection 01/30/2018  . Chest tightness 01/27/2018  . Healthcare-associated pneumonia 01/25/2018  . Pleural effusion, not elsewhere classified 01/25/2018  . Acute deep vein thrombosis (DVT) of left lower extremity ([place]) 01/24/2018  . Acute deep vein thrombosis (DVT) of right lower extremity ([place]) 01/24/2018  . Chronic allergic rhinitis 01/24/2018  . Depression with anxiety 01/24/2018  . UTI due to Klebsiella species 01/24/2018  . Protein-calorie malnutrition, severe ([place]) 01/24/2018  . S/P insertion of IVC (inferior vena caval) filter 01/24/2018  . Reactive depression   . Hypokalemia   . Leukocytosis   . Essential hypertension   .  Trauma   . Tetraparesis ([place])   . Neuropathic pain   . Neurogenic bowel   . Neurogenic bladder   . Benign essential HTN   . Acute blood loss anemia   . Central cord syndrome at C6 level of cervical spinal cord ([place]) 11/29/2017  . Central cord syndrome [facility]) 11/29/2017    Lewis Moccasin, PT 04/21/2020, 1:08 PM  [place] MAIN [facility] SERVICES [address] [place], [place], [address] Phone: [phone]   Fax:  [phone]  Name: DAYRA RAPLEY MRN: [id number] Date of Birth: 24-Jul-1934

## 2020-04-21 NOTE — Therapy (Signed)
[place] MAIN [facility] SERVICES [address] [place], [place], [address] Phone: [phone]   Fax:  [phone]  Occupational Therapy Treatment  Patient Details  Name: Sherry [name] MRN: [id number] Date of Birth: [birth year] No data recorded  Encounter Date: 04/21/2020   OT End of Session - 04/21/20 1319    Visit Number 36    Number of Visits 46    Date for OT Re-Evaluation 05/21/20    Authorization Time Period Progress report period starting 04/02/2020    Equipment Utilized During Treatment moist heat    Activity Tolerance Patient tolerated treatment well    Behavior During Therapy [facility] for tasks assessed/performed           Past Medical History:  Diagnosis Date  . Acute blood loss anemia   . Arthritis   . Cancer ([place])    skin  . Central cord syndrome at C6 level of cervical spinal cord ([place]) 11/29/2017  . Hypertension   . Protein-calorie malnutrition, severe ([place]) 01/24/2018  . S/P insertion of IVC (inferior vena caval) filter 01/24/2018  . Tetraparesis [facility])     Past Surgical History:  Procedure Laterality Date  . ANTERIOR CERVICAL DECOMP/DISCECTOMY FUSION N/A 11/29/2017   Procedure: Cervical five-six, six-seven Anterior Cervical Decompression Fusion;  Surgeon: Judith Part, MD;  Location: [place];  Service: Neurosurgery;  Laterality: N/A;  Cervical five-six, six-seven Anterior Cervical Decompression Fusion  . CATARACT EXTRACTION    . IR IVC FILTER PLMT / S&I /IMG GUID/MOD SED  12/08/2017    There were no vitals filed for this visit.   Subjective Assessment - 04/21/20 1319    Subjective  Pt. reports that she is doing well today    Patient is accompanied by: Family member    Pertinent History Patient s/p fall November 29, 2017 resulting in diagnosis of central cord syndrome at C 6 level.  She has had therapy in multiple venues but with recent move to [facility], therapy staff recommended she seek outpatient therapy for her  needs.    Patient Stated Goals Patient would like to be able to move better in bed, stand and perform self care tasks.    Currently in Pain? No/denies          OT Treatment  Pt.tolerated bilateral shoulder flexion, extension, abduction, elbow flexion, extension forearm supination AAROM/AROM. PROM bilateral wrist extension, digit MP, PIP, and DIP flexion, and extension in conjunction with moist heat.Pt. worked on bilateral UE reaching with the right and Left UE using the horizontal [employer]. Pt. worked on reaching with incorporating her core to improve trunk control while reaching up, and sliding them to the right through 2 horizontal rungs to the right, and 3 rungs to the left.  Pt. was able to able to move the [place] rings through 3 horizontal rungs to the left, and 2 horizontal rungs to the right with bilateral hand coordination. Pt.continues to engageher right hand whenperforming self-feeding with adpativeequipment.Pt. reports that she is able to use her right thumb to hold items. Pt. tolerated ROM well without reports of pain, or discomfort. Pt.continues to benefit from working on improving ROM in order to work towards increasing engagement of her bilateral hands during ADLs, and IADL tasks.                      OT Education - 04/21/20 1319    Education Details BUE ROM with functional reaching    Person(s) Educated Patient;Caregiver(s)  Methods Explanation;Demonstration    Comprehension Verbalized understanding;Need further instruction               OT Long Term Goals - 04/02/20 1549      OT LONG TERM GOAL #1   Title Patient and caregiver will demonstrate understanding of home exercise program for ROM.    Baseline Pt. continues to have a restorative aide rehab aide assist her wih exercises at [facility]    Time 12    Period Weeks    Status On-going    Target Date 05/21/20      OT LONG TERM GOAL #2   Title Patient will demonstrate ability  to don shirt with min assist from seated position.    Baseline Pt. is indepedent with a larger shirt, and continues to require increased time to complete    Time 12    Period Weeks    Status On-going    Target Date 05/21/20      OT LONG TERM GOAL #4   Title Patient will demonstrate improved composite finger flexion to hold deodorant to apply to underarms.    Baseline Pt. continues to present with improving finger flexion of R hand for a partial gross grasp, but unable to grasp and hold items. Very limited digit flexion of L hand. Pt. has active left thumb abduction.    Time 12    Period Weeks    Status On-going    Target Date 05/21/20      OT LONG TERM GOAL #6   Title Patient will increase right UE ROM to comb the back of her hair with modified independence.    Baseline Pt. continues to be able to comb the front, and sides of her head. Pt. can now reach the back, however cannot see what she is doing in the back of her head.    Time 12    Period Weeks    Status Partially Met    Target Date 05/21/20      OT LONG TERM GOAL #8   Title Pt. will write, and sign her name with 100% legibility, and modified independence.    Baseline Pt. conitnues to  consistently filling out her daily menu, and writing for crossword puzzles. Pt. is able to maintain grasp on a wide width pen. Contineus to work on Media planner.    Time 12    Period Weeks    Status On-going    Target Date 05/21/20                 Plan - 04/21/20 1320    Clinical Impression Statement Pt. was able to able to move the [employer] rings through 3 horizontal rungs to the left, and 2 horizontal rungs to the right with bilateral hand coordination. Pt.continues to engageher right hand whenperforming self-feeding with adpativeequipment.Pt. reports that she is able to use her right thumb to hold items. Pt. tolerated ROM well without reports of pain, or discomfort. Pt.continues to benefit from working on improving ROM in  order to work towards increasing engagement of her bilateral hands during ADLs, and IADL tasks.   OT Occupational Profile and History Detailed Assessment- Review of Records and additional review of physical, cognitive, psychosocial history related to current functional performance    Occupational performance deficits (Please refer to evaluation for details): ADL's;IADL's;Leisure;Social Participation    Body Structure / Function / Physical Skills ADL;Continence;Dexterity;Flexibility;Strength;ROM;Balance;Coordination;FMC;IADL;Endurance;UE functional use;Decreased knowledge of use of DME;GMC    Psychosocial Skills Environmental  Adaptations;Habits;Routines and  Behaviors    Rehab Potential Fair    Clinical Decision Making Limited treatment options, no task modification necessary    Comorbidities Affecting Occupational Performance: Presence of comorbidities impacting occupational performance    Modification or Assistance to Complete Evaluation  No modification of tasks or assist necessary to complete eval    OT Frequency 2x / week    OT Duration 12 weeks    OT Treatment/Interventions Self-care/ADL training;Cryotherapy;Therapeutic exercise;DME and/or AE instruction;Balance training;Neuromuscular education;Manual Therapy;Splinting;Moist Heat;Passive range of motion;Therapeutic activities;Patient/family education    Plan continue to progress ROM of digits, wrists and shoulders as it pertains to completion of ADL tasks that pt values.    Consulted and Agree with Plan of Care Patient           Patient will benefit from skilled therapeutic intervention in order to improve the following deficits and impairments:   Body Structure / Function / Physical Skills: ADL,Continence,Dexterity,Flexibility,Strength,ROM,Balance,Coordination,FMC,IADL,Endurance,UE functional use,Decreased knowledge of use of [facility]   Psychosocial Skills: Environmental  Adaptations,Habits,Routines and Behaviors   Visit  Diagnosis: Muscle weakness (generalized)  Other lack of coordination    Problem List Patient Active Problem List   Diagnosis Date Noted  . Acute appendicitis with appendiceal abscess 05/30/2018  . Hypocalcemia 02/15/2018  . Dysuria 02/15/2018  . Bilateral lower extremity edema 02/11/2018  . Vaginal yeast infection 01/30/2018  . Chest tightness 01/27/2018  . Healthcare-associated pneumonia 01/25/2018  . Pleural effusion, not elsewhere classified 01/25/2018  . Acute deep vein thrombosis (DVT) of left lower extremity ([place]) 01/24/2018  . Acute deep vein thrombosis (DVT) of right lower extremity ([place]) 01/24/2018  . Chronic allergic rhinitis 01/24/2018  . Depression with anxiety 01/24/2018  . UTI due to Klebsiella species 01/24/2018  . Protein-calorie malnutrition, severe ([place]) 01/24/2018  . S/P insertion of IVC (inferior vena caval) filter 01/24/2018  . Reactive depression   . Hypokalemia   . Leukocytosis   . Essential hypertension   . Trauma   . Tetraparesis ([place])   . Neuropathic pain   . Neurogenic bowel   . Neurogenic bladder   . Benign essential HTN   . Acute blood loss anemia   . Central cord syndrome at C6 level of cervical spinal cord ([place]) 11/29/2017  . Central cord syndrome [facility]) 11/29/2017    Harrel Carina, MS, OTR/L 04/21/2020, 1:21 PM  [place] MAIN [facility] SERVICES [address] [place], [place], [address] Phone: [phone]   Fax:  [phone]  Name: LILA LUFKIN MRN: [id number] Date of Birth: July 08, 1934

## 2020-04-23 ENCOUNTER — Encounter: Payer: Self-pay | Admitting: Occupational Therapy

## 2020-04-23 ENCOUNTER — Ambulatory Visit: Payer: Medicare HMO

## 2020-04-23 ENCOUNTER — Ambulatory Visit: Payer: Medicare HMO | Admitting: Occupational Therapy

## 2020-04-23 ENCOUNTER — Other Ambulatory Visit: Payer: Self-pay

## 2020-04-23 DIAGNOSIS — M6281 Muscle weakness (generalized): Secondary | ICD-10-CM

## 2020-04-23 DIAGNOSIS — R278 Other lack of coordination: Secondary | ICD-10-CM

## 2020-04-23 DIAGNOSIS — R2689 Other abnormalities of gait and mobility: Secondary | ICD-10-CM

## 2020-04-23 NOTE — Therapy (Signed)
[place] MAIN [facility] SERVICES [address] [place], [place], [address] Phone: [phone]   Fax:  [phone]  Occupational Therapy Treatment  Patient Details  Name: Sherry [name] MRN: [id number] Date of Birth: 04/10/1934 No data recorded  Encounter Date: 04/23/2020   OT End of Session - 04/23/20 1801    Visit Number 94    Number of Visits 24    Date for OT Re-Evaluation 05/21/20    Authorization Time Period Progress report period starting 04/02/2020    OT Start Time 1103    OT Stop Time 1145    OT Time Calculation (min) 42 min    Activity Tolerance Patient tolerated treatment well    Behavior During Therapy [facility] for tasks assessed/performed           Past Medical History:  Diagnosis Date  . Acute blood loss anemia   . Arthritis   . Cancer ([place])    skin  . Central cord syndrome at C6 level of cervical spinal cord ([place]) 11/29/2017  . Hypertension   . Protein-calorie malnutrition, severe ([place]) 01/24/2018  . S/P insertion of IVC (inferior vena caval) filter 01/24/2018  . Tetraparesis [facility])     Past Surgical History:  Procedure Laterality Date  . ANTERIOR CERVICAL DECOMP/DISCECTOMY FUSION N/A 11/29/2017   Procedure: Cervical five-six, six-seven Anterior Cervical Decompression Fusion;  Surgeon: Judith Part, MD;  Location: [place];  Service: Neurosurgery;  Laterality: N/A;  Cervical five-six, six-seven Anterior Cervical Decompression Fusion  . CATARACT EXTRACTION    . IR IVC FILTER PLMT / S&I /IMG GUID/MOD SED  12/08/2017    There were no vitals filed for this visit.   Subjective Assessment - 04/23/20 1759    Subjective  Pt. reports that she is not feeling well today, and cancelled PT session after OT    Patient is accompanied by: Family member    Pertinent History Patient s/p fall November 29, 2017 resulting in diagnosis of central cord syndrome at C 6 level.  She has had therapy in multiple venues but with recent move to  [facility], therapy staff recommended she seek outpatient therapy for her needs.    Patient Stated Goals Patient would like to be able to move better in bed, stand and perform self care tasks.    Currently in Pain? Yes    Pain Score 5     Pain Location Buttocks    Pain Descriptors / Indicators Sore   From depends         OT Treatment  Pt.tolerated bilateral shoulder flexion, extension, abduction, elbow flexion, extension forearm supination AAROM/AROM. PROM bilateral wrist extension, digit MP, PIP, and DIP flexion, and extension in conjunction with moist heat.  Pt. Reports 5/10 pain in her gluteal crease from irritation caused by depends undergarments. Pt. Attempted reposition with minimal relief. Pt. Was unable to remain for PT session following OT. Pt. Reports fatigue, and being more tired this week. Pt.continues to engageher right hand whenperforming self-feeding with adpativeequipment.Pt. reports that she is able to use her right thumb to hold items. Pt. tolerated ROM well without reports of pain, or discomfort. Pt.continues to benefit from working on improving ROM in order to work towards increasing engagement of her bilateral hands during ADLs, and IADL tasks.                       OT Education - 04/23/20 1801    Education Details BUE ROM with functional  reaching    Person(s) Educated Patient;Caregiver(s)    Methods Explanation;Demonstration    Comprehension Verbalized understanding;Need further instruction               OT Long Term Goals - 04/02/20 1549      OT LONG TERM GOAL #1   Title Patient and caregiver will demonstrate understanding of home exercise program for ROM.    Baseline Pt. continues to have a restorative aide rehab aide assist her wih exercises at [facility]    Time 12    Period Weeks    Status On-going    Target Date 05/21/20      OT LONG TERM GOAL #2   Title Patient will demonstrate ability to don shirt with min assist  from seated position.    Baseline Pt. is indepedent with a larger shirt, and continues to require increased time to complete    Time 12    Period Weeks    Status On-going    Target Date 05/21/20      OT LONG TERM GOAL #4   Title Patient will demonstrate improved composite finger flexion to hold deodorant to apply to underarms.    Baseline Pt. continues to present with improving finger flexion of R hand for a partial gross grasp, but unable to grasp and hold items. Very limited digit flexion of L hand. Pt. has active left thumb abduction.    Time 12    Period Weeks    Status On-going    Target Date 05/21/20      OT LONG TERM GOAL #6   Title Patient will increase right UE ROM to comb the back of her hair with modified independence.    Baseline Pt. continues to be able to comb the front, and sides of her head. Pt. can now reach the back, however cannot see what she is doing in the back of her head.    Time 12    Period Weeks    Status Partially Met    Target Date 05/21/20      OT LONG TERM GOAL #8   Title Pt. will write, and sign her name with 100% legibility, and modified independence.    Baseline Pt. conitnues to  consistently filling out her daily menu, and writing for crossword puzzles. Pt. is able to maintain grasp on a wide width pen. Contineus to work on Media planner.    Time 12    Period Weeks    Status On-going    Target Date 05/21/20                 Plan - 04/23/20 1801    Clinical Impression Statement Pt. Reports 5/10 pain in her gluteal crease from irritation caused by depends undergarments. Pt. Attempted reposition with minimal relief. Pt. Was unable to remain for PT session following OT. Pt. Reports fatigue, and being more tired this week. Pt.continues to engageher right hand whenperforming self-feeding with adpativeequipment.Pt. reports that she is able to use her right thumb to hold items. Pt. tolerated ROM well without reports of pain, or discomfort.  Pt.continues to benefit from working on improving ROM in order to work towards increasing engagement of her bilateral hands during ADLs, and IADL tasks.      OT Occupational Profile and History Detailed Assessment- Review of Records and additional review of physical, cognitive, psychosocial history related to current functional performance    Occupational performance deficits (Please refer to evaluation for details): ADL's;IADL's;Leisure;Social Participation  Body Structure / Function / Physical Skills ADL;Continence;Dexterity;Flexibility;Strength;ROM;Balance;Coordination;FMC;IADL;Endurance;UE functional use;Decreased knowledge of use of DME;GMC    Psychosocial Skills Environmental  Adaptations;Habits;Routines and Behaviors    Rehab Potential Fair    Clinical Decision Making Limited treatment options, no task modification necessary    Comorbidities Affecting Occupational Performance: Presence of comorbidities impacting occupational performance    Comorbidities impacting occupational performance description: contractures of bilateral hands, dependent transfers,    Modification or Assistance to Complete Evaluation  No modification of tasks or assist necessary to complete eval    OT Frequency 2x / week    OT Duration 12 weeks    OT Treatment/Interventions Self-care/ADL training;Cryotherapy;Therapeutic exercise;DME and/or AE instruction;Balance training;Neuromuscular education;Manual Therapy;Splinting;Moist Heat;Passive range of motion;Therapeutic activities;Patient/family education    Consulted and Agree with Plan of Care Patient           Patient will benefit from skilled therapeutic intervention in order to improve the following deficits and impairments:   Body Structure / Function / Physical Skills: ADL,Continence,Dexterity,Flexibility,Strength,ROM,Balance,Coordination,FMC,IADL,Endurance,UE functional use,Decreased knowledge of use of [facility]   Psychosocial Skills: Environmental   Adaptations,Habits,Routines and Behaviors   Visit Diagnosis: Muscle weakness (generalized)    Problem List Patient Active Problem List   Diagnosis Date Noted  . Acute appendicitis with appendiceal abscess 05/30/2018  . Hypocalcemia 02/15/2018  . Dysuria 02/15/2018  . Bilateral lower extremity edema 02/11/2018  . Vaginal yeast infection 01/30/2018  . Chest tightness 01/27/2018  . Healthcare-associated pneumonia 01/25/2018  . Pleural effusion, not elsewhere classified 01/25/2018  . Acute deep vein thrombosis (DVT) of left lower extremity ([place]) 01/24/2018  . Acute deep vein thrombosis (DVT) of right lower extremity ([place]) 01/24/2018  . Chronic allergic rhinitis 01/24/2018  . Depression with anxiety 01/24/2018  . UTI due to Klebsiella species 01/24/2018  . Protein-calorie malnutrition, severe ([place]) 01/24/2018  . S/P insertion of IVC (inferior vena caval) filter 01/24/2018  . Reactive depression   . Hypokalemia   . Leukocytosis   . Essential hypertension   . Trauma   . Tetraparesis ([place])   . Neuropathic pain   . Neurogenic bowel   . Neurogenic bladder   . Benign essential HTN   . Acute blood loss anemia   . Central cord syndrome at C6 level of cervical spinal cord ([place]) 11/29/2017  . Central cord syndrome [facility]) 11/29/2017    Harrel Carina, MS, OTR/L 04/23/2020, 6:02 PM  [place] MAIN [facility] SERVICES [address] [place], [place], [address] Phone: [phone]   Fax:  [phone]  Name: Sherry [name] MRN: [id number] Date of Birth: 12-05-1934

## 2020-04-23 NOTE — Therapy (Signed)
[place] MAIN [facility] SERVICES [address] [place], [place], [address] Phone: [phone]   Fax:  [phone]  Patient Details  Name: Sherry [name] MRN: [id number] Date of Birth: 11/25/1934 Referring Provider:  Venia Carbon, MD  Encounter Date: 04/23/2020   * Patient arrived to appointment after completing her OT session and reported need to cancel stating she is not feeling well and requesting to cancel her PT today.    Lewis Moccasin, PT 04/23/2020, 12:45 PM  [place] MAIN [facility] SERVICES [address] [place], [place], [address] Phone: [phone]   Fax:  [phone]

## 2020-04-28 ENCOUNTER — Ambulatory Visit: Payer: Medicare HMO

## 2020-04-28 ENCOUNTER — Ambulatory Visit: Payer: Medicare HMO | Admitting: Occupational Therapy

## 2020-04-28 ENCOUNTER — Encounter: Payer: Self-pay | Admitting: Occupational Therapy

## 2020-04-28 ENCOUNTER — Other Ambulatory Visit: Payer: Self-pay

## 2020-04-28 DIAGNOSIS — I82729 Chronic embolism and thrombosis of deep veins of unspecified upper extremity: Secondary | ICD-10-CM | POA: Diagnosis not present

## 2020-04-28 DIAGNOSIS — G8220 Paraplegia, unspecified: Secondary | ICD-10-CM | POA: Diagnosis not present

## 2020-04-28 DIAGNOSIS — S14129S Central cord syndrome at unspecified level of cervical spinal cord, sequela: Secondary | ICD-10-CM

## 2020-04-28 DIAGNOSIS — R2689 Other abnormalities of gait and mobility: Secondary | ICD-10-CM

## 2020-04-28 DIAGNOSIS — R278 Other lack of coordination: Secondary | ICD-10-CM

## 2020-04-28 DIAGNOSIS — M6281 Muscle weakness (generalized): Secondary | ICD-10-CM | POA: Diagnosis not present

## 2020-04-28 DIAGNOSIS — F39 Unspecified mood [affective] disorder: Secondary | ICD-10-CM | POA: Diagnosis not present

## 2020-04-28 DIAGNOSIS — K592 Neurogenic bowel, not elsewhere classified: Secondary | ICD-10-CM | POA: Diagnosis not present

## 2020-04-28 NOTE — Therapy (Signed)
[place] MAIN [facility] SERVICES [address] [place], [place], [address] Phone: [phone]   Fax:  [phone]  Physical Therapy Treatment  Patient Details  Name: Sherry [name] MRN: [id number] Date of Birth: May 14, 1934 Referring Provider (PT): Viviana Simpler   Encounter Date: 04/28/2020   PT End of Session - 04/28/20 1256    Visit Number 51    Number of Visits 71    Date for PT Re-Evaluation 06/30/20    PT Start Time 1146    PT Stop Time 1218    PT Time Calculation (min) 32 min    Equipment Utilized During Treatment Gait belt    Activity Tolerance Patient tolerated treatment well;Patient limited by fatigue    Behavior During Therapy [facility] for tasks assessed/performed           Past Medical History:  Diagnosis Date  . Acute blood loss anemia   . Arthritis   . Cancer ([place])    skin  . Central cord syndrome at C6 level of cervical spinal cord ([place]) 11/29/2017  . Hypertension   . Protein-calorie malnutrition, severe ([place]) 01/24/2018  . S/P insertion of IVC (inferior vena caval) filter 01/24/2018  . Tetraparesis [facility])     Past Surgical History:  Procedure Laterality Date  . ANTERIOR CERVICAL DECOMP/DISCECTOMY FUSION N/A 11/29/2017   Procedure: Cervical five-six, six-seven Anterior Cervical Decompression Fusion;  Surgeon: Judith Part, MD;  Location: [place];  Service: Neurosurgery;  Laterality: N/A;  Cervical five-six, six-seven Anterior Cervical Decompression Fusion  . CATARACT EXTRACTION    . IR IVC FILTER PLMT / S&I /IMG GUID/MOD SED  12/08/2017    There were no vitals filed for this visit.   Subjective Assessment - 04/28/20 1151    Subjective Patient reports feeling better this week just weak. Denies any pain.    Patient is accompained by: --   [name] (aide)   Pertinent History Pt is an 86 yo female that fell in 2019, fractured vertebrae in her neck and in her low back per family. Per chart, pt experienced incomplete  quadiparesis at level C6. After hospital stay, pt discharged to [facility] for ~1 month, experienced severe UTI as well as bilateral DVT (IVC filter placed). Discharged to [facility], stayed for about a year, and then moved to [facility] in April 2021. Was receiving PT, but per family facility reported that did not have adequate equipment to maximize PT for her. Pt until about 1 month ago was practicing sit to stand transfers with 1-2 people, and working on static standing. Has a brace for L foot due to PF. Pt currently needs assistance with all ADLs (able to assist with donning/doffing her shirt), bed baths, and needs a hoyer lift for transfers. Able to drive power wheelchair without assistance.    Limitations Standing;Walking;House hold activities;Lifting    How long can you sit comfortably? NA    How long can you stand comfortably? unable    How long can you walk comfortably? unable    Patient Stated Goals "to stand up and walk"    Currently in Pain? No/denies    Pain Onset 1 to 4 weeks ago            Interventions:   Standing trials:  1) 49 sec - Decreased ability to stand erect with poor hip extension and decreased weight bearing through her right LE.  2) 56 sec- Improved ability to ant weight shift and more hip ext activation yet limited standing  due to BLE weakness- Knees buckling.  3) 40 Sec- Unable with dynamic weight shift to RLE without knee buckling.  4) 36 sec- Patient struggled to stand with Max assist  And unable to maintain standing due to B knee buckling.   Seated therapeutic exercises:  Knee ext - AROM on left with eccentric control x 15 reps and AAROM on right with ability to achieve partial ROM.  Hip flex- AAROM with left/right LE x 15 reps - Very limited Right ability today.  Session terminated as patient fatigued and stated she was tired.   Clinical Impression: Patient experienced increased difficulty standing and fatigued quicker than previous session. She was exhibiting  increased difficulty/weakness with right LE and decreased weight bearing today. She will would benefit from continued PT services to increase mobility and strength to improve patient's quality of life.                         PT Education - 04/28/20 1255    Education Details Postural cues with standing    Person(s) Educated Patient    Methods Explanation;Demonstration;Tactile cues;Verbal cues    Comprehension Verbalized understanding;Returned demonstration;Verbal cues required;Tactile cues required;Need further instruction            PT Short Term Goals - 03/10/20 2211      PT SHORT TERM GOAL #1   Title The patient will perform initial HEP with minimum assistance in order to improve strength and function.    Baseline Patient demonstrating independence with initial HEP as of 03/10/2020 with no questions or difficulty.    Time 4    Period Weeks    Status Achieved    Target Date 02/11/20             PT Long Term Goals - 04/21/20 1301      PT LONG TERM GOAL #1   Title The patient will be compliant with finalized HEP with minimum assistance in preparation for self management and maintenance of condition.    Baseline 03/10/2020- Patient familiar with initial HEP and will keep goal active as HEP becomes more progressive including possible transfers and standing activities. 04/07/2020- Patient continues to report compliance with LE strengthening home exercises without questions or concerns. 04/21/2020- Patient able to verbalize and demonstrate good understanding of current HEP including some supine and seated LE strengthening exercises. Reviewed and patient performed 10 reps today with minimal cueing. Will keep goal active to progress HEP as appropriate.    Time 12    Period Weeks    Status On-going    Target Date 06/30/20      PT LONG TERM GOAL #2   Title The patient will demonstrate at least 10 point improvement on FOTO score indicating an improved ability to  perform functional activities.    Baseline on eval (/720) score 12; 10/22/19: 12; 12/10/19: 18, 12/13: 18: 04/07/2020- Will assess next visit.    Time 12    Period Weeks    Status On-going    Target Date 06/30/20      PT LONG TERM GOAL #3   Title The patient will demonstrate lateral scooting  for assistance with transfers with minA to maximize independence.    Baseline 10/22/19: Pt requires modA+1 for lateral scooting; 12/10/19: modA+1, 12/13: modA-maxA, 12/27: modA-maxA, 03/10/2020- ModA-MaxA- increased verbal cues and visual demonstration. 04/07/2020- Continued Max assist to perform forward/lateral scoot. 04/21/2020- Patient was able to demo slight lateral scoot with CGA approx 12 in then  fatigued requiring max assist- she is able to scoot forward by leaning without significant issues.    Time 12    Period Weeks    Status On-going    Target Date 06/30/20      PT LONG TERM GOAL #4   Title The patient will demonstrate a squat pivot transfer with minimum assistance to maximize independence and mobility.    Baseline 10/22/19: unable to safely attempt at this time; 12/10/19: unable to safely attempt at this time, 01/14/20: unable to safety attempt at this time. 03/10/2020- Patient able to perform Stand pivot transfer with Maximal assist. 04/07/2020- Patient continues to require Max assist with sit to stand- will assist SPT next visit. 04/21/2020- Patient presents with Max assist to perform Sit to stand Transfer- unable to pivot or move Left LE well without difficulty.    Time 12    Period Weeks    Status On-going    Target Date 06/30/20      PT LONG TERM GOAL #5   Title The patient will demonstrate sitting without UE support for 2-5 minutes at EOB to improve participation and maximize independence with ADLs.    Baseline 10/22/19: Pt able ot sit unsupported for at least 2 minutes at edge of bed. 04/21/2020- Patient continues to demo good sitting at edge of mat > 5 miin without difficulty or back support.     Time 12    Period Weeks    Status Achieved                 Plan - 04/28/20 1236    Clinical Impression Statement Patient experienced increased difficulty standing and fatigued quicker than previous session. She was exhibiting increased difficulty/weakness with right LE and decreased weight bearing today. She will would benefit from continued PT services to increase mobility and strength to improve patient's quality of life.    Personal Factors and Comorbidities Age;Time since onset of injury/illness/exacerbation;Comorbidity 3+;Fitness    Comorbidities HTN, quadriparesis, history of DVT, neurogenic bladder    Examination-Activity Limitations Bathing;Hygiene/Grooming;Squat;Bed Mobility;Lift;Bend;Stand;Engineer, manufacturing;Toileting;Self Feeding;Transfers;Continence;Sit;Dressing;Sleep;Carry    Examination-Participation Restrictions Church;Laundry;Cleaning;Medication Management;Community Activity;Meal Prep;Interpersonal Relationship    Stability/Clinical Decision Making Evolving/Moderate complexity    Rehab Potential Fair    PT Frequency 2x / week    PT Duration 12 weeks    PT Treatment/Interventions ADLs/Self Care Home Management;Electrical Stimulation;Therapeutic activities;Wheelchair mobility training;Vasopneumatic Device;Joint Manipulations;Vestibular;Passive range of motion;Patient/family education;Therapeutic exercise;DME Instruction;Biofeedback;Aquatic Therapy;Moist Heat;Gait training;Balance training;Orthotic Fit/Training;Dry needling;Energy conservation;Taping;Splinting;Neuromuscular re-education;Cryotherapy;Ultrasound;Functional mobility training    PT Next Visit Plan Continue with progressive Standing/transfer training.    PT Home Exercise Plan no changes    Consulted and Agree with Plan of Care Patient           Patient will benefit from skilled therapeutic intervention in order to improve the following deficits and impairments:  Abnormal gait,Decreased  balance,Decreased endurance,Decreased mobility,Difficulty walking,Hypomobility,Impaired sensation,Decreased range of motion,Improper body mechanics,Impaired perceived functional ability,Decreased activity tolerance,Decreased knowledge of use of DME,Decreased safety awareness,Decreased strength,Impaired flexibility,Impaired UE functional use,Postural dysfunction  Visit Diagnosis: Muscle weakness (generalized)  Other lack of coordination  Other abnormalities of gait and mobility  Central cord syndrome, sequela ([place])     Problem List Patient Active Problem List   Diagnosis Date Noted  . Acute appendicitis with appendiceal abscess 05/30/2018  . Hypocalcemia 02/15/2018  . Dysuria 02/15/2018  . Bilateral lower extremity edema 02/11/2018  . Vaginal yeast infection 01/30/2018  . Chest tightness 01/27/2018  . Healthcare-associated pneumonia 01/25/2018  . Pleural effusion, not elsewhere classified 01/25/2018  .  Acute deep vein thrombosis (DVT) of left lower extremity ([place]) 01/24/2018  . Acute deep vein thrombosis (DVT) of right lower extremity ([place]) 01/24/2018  . Chronic allergic rhinitis 01/24/2018  . Depression with anxiety 01/24/2018  . UTI due to Klebsiella species 01/24/2018  . Protein-calorie malnutrition, severe ([place]) 01/24/2018  . S/P insertion of IVC (inferior vena caval) filter 01/24/2018  . Reactive depression   . Hypokalemia   . Leukocytosis   . Essential hypertension   . Trauma   . Tetraparesis ([place])   . Neuropathic pain   . Neurogenic bowel   . Neurogenic bladder   . Benign essential HTN   . Acute blood loss anemia   . Central cord syndrome at C6 level of cervical spinal cord ([place]) 11/29/2017  . Central cord syndrome [facility]) 11/29/2017    Lewis Moccasin, PT 04/28/2020, 12:58 PM  [place] MAIN [facility] SERVICES [address] [place], [place], [address] Phone: [phone]   Fax:  [phone]  Name: Sherry [name] MRN: [id number] Date of Birth: [birth year]

## 2020-04-28 NOTE — Therapy (Signed)
[place] MAIN [facility] SERVICES [address] [place], [place], [address] Phone: [phone]   Fax:  [phone]  Occupational Therapy Treatment  Patient Details  Name: Sherry [name] MRN: [id number] Date of Birth: [birth year] No data recorded  Encounter Date: 04/28/2020   OT End of Session - 04/28/20 1054    Visit Number 78    Number of Visits 48    Date for OT Re-Evaluation 05/21/20    Authorization Time Period Progress report period starting 04/02/2020    OT Start Time 1100    OT Stop Time 1145    OT Time Calculation (min) 45 min    Equipment Utilized During Treatment moist heat    Activity Tolerance Patient tolerated treatment well    Behavior During Therapy [facility] for tasks assessed/performed           Past Medical History:  Diagnosis Date  . Acute blood loss anemia   . Arthritis   . Cancer ([place])    skin  . Central cord syndrome at C6 level of cervical spinal cord ([place]) 11/29/2017  . Hypertension   . Protein-calorie malnutrition, severe ([place]) 01/24/2018  . S/P insertion of IVC (inferior vena caval) filter 01/24/2018  . Tetraparesis [facility])     Past Surgical History:  Procedure Laterality Date  . ANTERIOR CERVICAL DECOMP/DISCECTOMY FUSION N/A 11/29/2017   Procedure: Cervical five-six, six-seven Anterior Cervical Decompression Fusion;  Surgeon: Judith Part, MD;  Location: [place];  Service: Neurosurgery;  Laterality: N/A;  Cervical five-six, six-seven Anterior Cervical Decompression Fusion  . CATARACT EXTRACTION    . IR IVC FILTER PLMT / S&I /IMG GUID/MOD SED  12/08/2017    There were no vitals filed for this visit.   Subjective Assessment - 04/28/20 1053    Subjective  Pt reports feeling good today and she is excited about the mens and womens march madness games.    Patient is accompanied by: Family member    Pertinent History Patient s/p fall November 29, 2017 resulting in diagnosis of central cord syndrome at C 6 level.   She has had therapy in multiple venues but with recent move to [facility], therapy staff recommended she seek outpatient therapy for her needs.    Patient Stated Goals Patient would like to be able to move better in bed, stand and perform self care tasks.    Currently in Pain? No/denies    Pain Onset --              Therapeutic Exercises   Pttolerated bilateral shoulder flexion, extension, abduction, elbow flexion, extension forearm supination, and thumb abduction/adduction AAROM/AROM. PROM bilateral wrist extension, digit MP, PIP, and DIP flexion, and extension in conjunction with moist heat.      OT Education - 04/28/20 1054    Education Details BUE ROM with functional reaching    Person(s) Educated Patient;Caregiver(s)    Methods Explanation;Demonstration    Comprehension Verbalized understanding;Need further instruction               OT Long Term Goals - 04/02/20 1549      OT LONG TERM GOAL #1   Title Patient and caregiver will demonstrate understanding of home exercise program for ROM.    Baseline Pt. continues to have a restorative aide rehab aide assist her wih exercises at [facility]    Time 12    Period Weeks    Status On-going    Target Date 05/21/20  OT LONG TERM GOAL #2   Title Patient will demonstrate ability to don shirt with min assist from seated position.    Baseline Pt. is indepedent with a larger shirt, and continues to require increased time to complete    Time 12    Period Weeks    Status On-going    Target Date 05/21/20      OT LONG TERM GOAL #4   Title Patient will demonstrate improved composite finger flexion to hold deodorant to apply to underarms.    Baseline Pt. continues to present with improving finger flexion of R hand for a partial gross grasp, but unable to grasp and hold items. Very limited digit flexion of L hand. Pt. has active left thumb abduction.    Time 12    Period Weeks    Status On-going    Target Date 05/21/20       OT LONG TERM GOAL #6   Title Patient will increase right UE ROM to comb the back of her hair with modified independence.    Baseline Pt. continues to be able to comb the front, and sides of her head. Pt. can now reach the back, however cannot see what she is doing in the back of her head.    Time 12    Period Weeks    Status Partially Met    Target Date 05/21/20      OT LONG TERM GOAL #8   Title Pt. will write, and sign her name with 100% legibility, and modified independence.    Baseline Pt. conitnues to  consistently filling out her daily menu, and writing for crossword puzzles. Pt. is able to maintain grasp on a wide width pen. [place] to work on Media planner.    Time 12    Period Weeks    Status On-going    Target Date 05/21/20                 Plan - 04/28/20 1054    Clinical Impression Statement Pt continues to show improvement in BUE proximal ROM, L greater than R and improved tolerance of AROM exercises. Pt demonstrates MOD I for jacket mgmt when work backwards. Pt continues to engage her right hand when performing self-feeding with adpative equipment. Pt reports that she is able to use her right thumb to hold items. Pt. tolerated ROM well without reports of pain, or discomfort. Pt continues to benefit from working on improving ROM in order to work towards increasing engagement of her bilateral hands during ADLs, and IADL tasks.    OT Occupational Profile and History Detailed Assessment- Review of Records and additional review of physical, cognitive, psychosocial history related to current functional performance    Occupational performance deficits (Please refer to evaluation for details): ADL's;IADL's;Leisure;Social Participation    Body Structure / Function / Physical Skills ADL;Continence;Dexterity;Flexibility;Strength;ROM;Balance;Coordination;FMC;IADL;Endurance;UE functional use;Decreased knowledge of use of DME;GMC    Psychosocial Skills Environmental   Adaptations;Habits;Routines and Behaviors    Rehab Potential Fair    Clinical Decision Making Limited treatment options, no task modification necessary    Comorbidities Affecting Occupational Performance: Presence of comorbidities impacting occupational performance    Comorbidities impacting occupational performance description: contractures of bilateral hands, dependent transfers,    Modification or Assistance to Complete Evaluation  No modification of tasks or assist necessary to complete eval    OT Frequency 2x / week    OT Duration 12 weeks    OT Treatment/Interventions Self-care/ADL training;Cryotherapy;Therapeutic exercise;DME and/or AE instruction;Balance training;Neuromuscular  education;Manual Therapy;Splinting;Moist Heat;Passive range of motion;Therapeutic activities;Patient/family education    Consulted and Agree with Plan of Care Patient           Patient will benefit from skilled therapeutic intervention in order to improve the following deficits and impairments:   Body Structure / Function / Physical Skills: ADL,Continence,Dexterity,Flexibility,Strength,ROM,Balance,Coordination,FMC,IADL,Endurance,UE functional use,Decreased knowledge of use of [facility]   Psychosocial Skills: Environmental  Adaptations,Habits,Routines and Behaviors   Visit Diagnosis: Other lack of coordination  Central cord syndrome, sequela ([place])    Problem List Patient Active Problem List   Diagnosis Date Noted  . Acute appendicitis with appendiceal abscess 05/30/2018  . Hypocalcemia 02/15/2018  . Dysuria 02/15/2018  . Bilateral lower extremity edema 02/11/2018  . Vaginal yeast infection 01/30/2018  . Chest tightness 01/27/2018  . Healthcare-associated pneumonia 01/25/2018  . Pleural effusion, not elsewhere classified 01/25/2018  . Acute deep vein thrombosis (DVT) of left lower extremity ([place]) 01/24/2018  . Acute deep vein thrombosis (DVT) of right lower extremity ([place]) 01/24/2018  . Chronic  allergic rhinitis 01/24/2018  . Depression with anxiety 01/24/2018  . UTI due to Klebsiella species 01/24/2018  . Protein-calorie malnutrition, severe ([place]) 01/24/2018  . S/P insertion of IVC (inferior vena caval) filter 01/24/2018  . Reactive depression   . Hypokalemia   . Leukocytosis   . Essential hypertension   . Trauma   . Tetraparesis ([place])   . Neuropathic pain   . Neurogenic bowel   . Neurogenic bladder   . Benign essential HTN   . Acute blood loss anemia   . Central cord syndrome at C6 level of cervical spinal cord ([place]) 11/29/2017  . Central cord syndrome ([place]) 11/29/2017    Dessie Coma, M.S. OTR/L  04/28/20, 12:29 PM  ascom [phone]  [place] MAIN [facility] SERVICES [address] [place], [place], [address] Phone: [phone]   Fax:  [phone]  Name: Sherry [name] MRN: [id number] Date of Birth: [birth year]

## 2020-04-30 ENCOUNTER — Ambulatory Visit: Payer: Medicare HMO

## 2020-04-30 ENCOUNTER — Encounter: Payer: Self-pay | Admitting: Occupational Therapy

## 2020-04-30 ENCOUNTER — Ambulatory Visit: Payer: Medicare HMO | Admitting: Occupational Therapy

## 2020-04-30 DIAGNOSIS — R278 Other lack of coordination: Secondary | ICD-10-CM

## 2020-04-30 DIAGNOSIS — R2689 Other abnormalities of gait and mobility: Secondary | ICD-10-CM

## 2020-04-30 DIAGNOSIS — S14129S Central cord syndrome at unspecified level of cervical spinal cord, sequela: Secondary | ICD-10-CM

## 2020-04-30 DIAGNOSIS — M6281 Muscle weakness (generalized): Secondary | ICD-10-CM

## 2020-04-30 NOTE — Therapy (Signed)
[place] MAIN [facility] SERVICES [address] [place], [place], [address] Phone: [phone]   Fax:  [phone]  Occupational Therapy Treatment  Patient Details  Name: Sherry [name] MRN: [id number] Date of Birth: 1934/02/11 No data recorded  Encounter Date: 04/30/2020   OT End of Session - 04/30/20 1110    Visit Number 75    Number of Visits 81    Date for OT Re-Evaluation 05/21/20    Authorization Time Period Progress report period starting 04/02/2020    OT Start Time 1102    OT Stop Time 1145    OT Time Calculation (min) 43 min    Equipment Utilized During Treatment moist heat    Activity Tolerance Patient tolerated treatment well    Behavior During Therapy [facility] for tasks assessed/performed           Past Medical History:  Diagnosis Date  . Acute blood loss anemia   . Arthritis   . Cancer ([place])    skin  . Central cord syndrome at C6 level of cervical spinal cord ([place]) 11/29/2017  . Hypertension   . Protein-calorie malnutrition, severe ([place]) 01/24/2018  . S/P insertion of IVC (inferior vena caval) filter 01/24/2018  . Tetraparesis [facility])     Past Surgical History:  Procedure Laterality Date  . ANTERIOR CERVICAL DECOMP/DISCECTOMY FUSION N/A 11/29/2017   Procedure: Cervical five-six, six-seven Anterior Cervical Decompression Fusion;  Surgeon: Judith Part, MD;  Location: [place];  Service: Neurosurgery;  Laterality: N/A;  Cervical five-six, six-seven Anterior Cervical Decompression Fusion  . CATARACT EXTRACTION    . IR IVC FILTER PLMT / S&I /IMG GUID/MOD SED  12/08/2017    There were no vitals filed for this visit.   Subjective Assessment - 04/30/20 1109    Subjective  Pt denies pain and reports feeling good today.    Patient is accompanied by: Family member    Pertinent History Patient s/p fall November 29, 2017 resulting in diagnosis of central cord syndrome at C 6 level.  She has had therapy in multiple venues but with  recent move to [facility], therapy staff recommended she seek outpatient therapy for her needs.    Patient Stated Goals Patient would like to be able to move better in bed, stand and perform self care tasks.    Currently in Pain? No/denies    Pain Onset 1 to 4 weeks ago           Therapeutic Exercise Pt tolerated bilateral shoulder flexion, extension, abduction, elbow flexion, extension forearm supination, and thumb abduction/adduction AAROM/AROM. PROM bilateral wrist extension, digit MP, PIP, and DIP flexion, and extension in conjunction with moist heat.  Therapeutic Activity Pt worked on reaching for [employer] and moving them through 3 levels of horizontal rungs placed at progressively higher heights. Pt fatigues quickly reaching overhead and requires rest breaks, cues for bringing torso forward to assist with 3rd level.                      OT Education - 04/30/20 1110    Education Details BUE ROM with functional reaching    Person(s) Educated Patient;Caregiver(s)    Methods Explanation;Demonstration    Comprehension Verbalized understanding;Need further instruction               OT Long Term Goals - 04/02/20 1549      OT LONG TERM GOAL #1   Title Patient and caregiver will demonstrate understanding  of home exercise program for ROM.    Baseline Pt. continues to have a restorative aide rehab aide assist her wih exercises at [facility]    Time 12    Period Weeks    Status On-going    Target Date 05/21/20      OT LONG TERM GOAL #2   Title Patient will demonstrate ability to don shirt with min assist from seated position.    Baseline Pt. is indepedent with a larger shirt, and continues to require increased time to complete    Time 12    Period Weeks    Status On-going    Target Date 05/21/20      OT LONG TERM GOAL #4   Title Patient will demonstrate improved composite finger flexion to hold deodorant to apply to underarms.    Baseline Pt.  continues to present with improving finger flexion of R hand for a partial gross grasp, but unable to grasp and hold items. Very limited digit flexion of L hand. Pt. has active left thumb abduction.    Time 12    Period Weeks    Status On-going    Target Date 05/21/20      OT LONG TERM GOAL #6   Title Patient will increase right UE ROM to comb the back of her hair with modified independence.    Baseline Pt. continues to be able to comb the front, and sides of her head. Pt. can now reach the back, however cannot see what she is doing in the back of her head.    Time 12    Period Weeks    Status Partially Met    Target Date 05/21/20      OT LONG TERM GOAL #8   Title Pt. will write, and sign her name with 100% legibility, and modified independence.    Baseline Pt. conitnues to  consistently filling out her daily menu, and writing for crossword puzzles. Pt. is able to maintain grasp on a wide width pen. [place] to work on Media pl[employer].    Time 12    Period Weeks    Status On-going    Target Date 05/21/20                 Plan - 04/30/20 1111    Clinical Impression Statement Pt continues to show improvement in BUE proximal ROM, L greater than R and improved tolerance of AROM exercises. Pt tolerates functional reach using [place] tower reaching 3rd highest level. Pt reports that she is able to use her right thumb to hold items. Pt. tolerated ROM well without reports of pain, or discomfort. Pt continues to benefit from working on improving ROM in order to work towards increasing engagement of her bilateral hands during ADLs, and IADL tasks.    OT Occupational Profile and History Detailed Assessment- Review of Records and additional review of physical, cognitive, psychosocial history related to current functional performance    Occupational performance deficits (Please refer to evaluation for details): ADL's;IADL's;Leisure;Social Participation    Body Structure / Function / Physical  Skills ADL;Continence;Dexterity;Flexibility;Strength;ROM;Balance;Coordination;FMC;IADL;Endurance;UE functional use;Decreased knowledge of use of DME;GMC    Psychosocial Skills Environmental  Adaptations;Habits;Routines and Behaviors    Rehab Potential Fair    Clinical Decision Making Limited treatment options, no task modification necessary    Comorbidities Affecting Occupational Performance: Presence of comorbidities impacting occupational performance    Comorbidities impacting occupational performance description: contractures of bilateral hands, dependent transfers,    Modification or Assistance to  Complete Evaluation  No modification of tasks or assist necessary to complete eval    OT Frequency 2x / week    OT Duration 12 weeks    OT Treatment/Interventions Self-care/ADL training;Cryotherapy;Therapeutic exercise;DME and/or AE instruction;Balance training;Neuromuscular education;Manual Therapy;Splinting;Moist Heat;Passive range of motion;Therapeutic activities;Patient/family education    Consulted and Agree with Plan of Care Patient           Patient will benefit from skilled therapeutic intervention in order to improve the following deficits and impairments:   Body Structure / Function / Physical Skills: ADL,Continence,Dexterity,Flexibility,Strength,ROM,Balance,Coordination,FMC,IADL,Endurance,UE functional use,Decreased knowledge of use of [facility]   Psychosocial Skills: Environmental  Adaptations,Habits,Routines and Behaviors   Visit Diagnosis: Other lack of coordination  Central cord syndrome, sequela (HCC)  Muscle weakness (generalized)    Problem List Patient Active Problem List   Diagnosis Date Noted  . Acute appendicitis with appendiceal abscess 05/30/2018  . Hypocalcemia 02/15/2018  . Dysuria 02/15/2018  . Bilateral lower extremity edema 02/11/2018  . Vaginal yeast infection 01/30/2018  . Chest tightness 01/27/2018  . Healthcare-associated pneumonia 01/25/2018  .  Pleural effusion, not elsewhere classified 01/25/2018  . Acute deep vein thrombosis (DVT) of left lower extremity ([place]) 01/24/2018  . Acute deep vein thrombosis (DVT) of right lower extremity ([place]) 01/24/2018  . Chronic allergic rhinitis 01/24/2018  . Depression with anxiety 01/24/2018  . UTI due to Klebsiella species 01/24/2018  . Protein-calorie malnutrition, severe ([place]) 01/24/2018  . S/P insertion of IVC (inferior vena caval) filter 01/24/2018  . Reactive depression   . Hypokalemia   . Leukocytosis   . Essential hypertension   . Trauma   . Tetraparesis ([place])   . Neuropathic pain   . Neurogenic bowel   . Neurogenic bladder   . Benign essential HTN   . Acute blood loss anemia   . Central cord syndrome at C6 level of cervical spinal cord ([place]) 11/29/2017  . Central cord syndrome [facility]) 11/29/2017    Dessie Coma, M.S. OTR/L  04/30/20, 11:37 AM  ascom [phone]  [place] MAIN [facility] SERVICES [address] [place], [place], [address] Phone: [phone]   Fax:  [phone]  Name: MCKAYLAH BETTENDORF MRN: [id number] Date of Birth: Dec 10, 1934

## 2020-04-30 NOTE — Therapy (Signed)
[place] MAIN [facility] SERVICES [address] [place], [place], [address] Phone: [phone]   Fax:  [phone]  Physical Therapy Treatment  Patient Details  Name: Sherry [name] MRN: [id number] Date of Birth: 1934-06-03 Referring Provider (PT): Viviana Simpler   Encounter Date: 04/30/2020   PT End of Session - 04/30/20 1150    Visit Number 52    Number of Visits 43    Date for PT Re-Evaluation 06/30/20    PT Start Time 1148    PT Stop Time 1220    PT Time Calculation (min) 32 min    Equipment Utilized During Treatment Gait belt    Activity Tolerance Patient tolerated treatment well;Patient limited by fatigue    Behavior During Therapy [facility] for tasks assessed/performed           Past Medical History:  Diagnosis Date  . Acute blood loss anemia   . Arthritis   . Cancer ([place])    skin  . Central cord syndrome at C6 level of cervical spinal cord ([place]) 11/29/2017  . Hypertension   . Protein-calorie malnutrition, severe ([place]) 01/24/2018  . S/P insertion of IVC (inferior vena caval) filter 01/24/2018  . Tetraparesis [facility])     Past Surgical History:  Procedure Laterality Date  . ANTERIOR CERVICAL DECOMP/DISCECTOMY FUSION N/A 11/29/2017   Procedure: Cervical five-six, six-seven Anterior Cervical Decompression Fusion;  Surgeon: Judith Part, MD;  Location: [place];  Service: Neurosurgery;  Laterality: N/A;  Cervical five-six, six-seven Anterior Cervical Decompression Fusion  . CATARACT EXTRACTION    . IR IVC FILTER PLMT / S&I /IMG GUID/MOD SED  12/08/2017    There were no vitals filed for this visit.   Subjective Assessment - 04/30/20 1147    Subjective Patient reports no new complaint and denies any pain or falls.    Patient is accompained by: --   [name] (aide)   Pertinent History Pt is an 86 yo female that fell in 2019, fractured vertebrae in her neck and in her low back per family. Per chart, pt experienced incomplete  quadiparesis at level C6. After hospital stay, pt discharged to [facility] for ~1 month, experienced severe UTI as well as bilateral DVT (IVC filter placed). Discharged to [facility], stayed for about a year, and then moved to [facility] in April 2021. Was receiving PT, but per family facility reported that did not have adequate equipment to maximize PT for her. Pt until about 1 month ago was practicing sit to stand transfers with 1-2 people, and working on static standing. Has a brace for L foot due to PF. Pt currently needs assistance with all ADLs (able to assist with donning/doffing her shirt), bed baths, and needs a hoyer lift for transfers. Able to drive power wheelchair without assistance.    Limitations Standing;Walking;House hold activities;Lifting    How long can you sit comfortably? NA    How long can you stand comfortably? unable    How long can you walk comfortably? unable    Patient Stated Goals "to stand up and walk"    Currently in Pain? No/denies    Pain Onset 1 to 4 weeks ago             Interventions:   Standing trials:  1) 43min 44 sec - Much improved ability to attain upright posture and maintain throughout. No knee buckling observed.   2) 55 sec-  Same as round 1 but stopped due to weakness 3) 1 min 36  Sec- Patient with improved ability to achieve erect posture with mod A and able to maintain with only mod A and knees blocked yet no buckling today.   Patient then stood one more time to help position back into powerchair with max assist.   Patient reported fatigued and requested to be done.    Clinical Impression: Patient experienced improved quality of standing versus last visit with improved quad control and hip extension. Patient able to perform better after cues to stand as erect as possible and position her hips over her feet.  She will would benefit from continued PT services to increase mobility and strength to improve patient's quality of  life.                                        PT Education - 04/30/20 1227    Education Details quad control and erect posture with standing. Reviewed importance of compliance with HEP    Person(s) Educated Patient    Methods Explanation;Demonstration;Tactile cues;Verbal cues    Comprehension Verbalized understanding;Returned demonstration;Verbal cues required;Tactile cues required;Need further instruction            PT Short Term Goals - 03/10/20 2211      PT SHORT TERM GOAL #1   Title The patient will perform initial HEP with minimum assistance in order to improve strength and function.    Baseline Patient demonstrating independence with initial HEP as of 03/10/2020 with no questions or difficulty.    Time 4    Period Weeks    Status Achieved    Target Date 02/11/20             PT Long Term Goals - 04/21/20 1301      PT LONG TERM GOAL #1   Title The patient will be compliant with finalized HEP with minimum assistance in preparation for self management and maintenance of condition.    Baseline 03/10/2020- Patient familiar with initial HEP and will keep goal active as HEP becomes more progressive including possible transfers and standing activities. 04/07/2020- Patient continues to report compliance with LE strengthening home exercises without questions or concerns. 04/21/2020- Patient able to verbalize and demonstrate good understanding of current HEP including some supine and seated LE strengthening exercises. Reviewed and patient performed 10 reps today with minimal cueing. Will keep goal active to progress HEP as appropriate.    Time 12    Period Weeks    Status On-going    Target Date 06/30/20      PT LONG TERM GOAL #2   Title The patient will demonstrate at least 10 point improvement on FOTO score indicating an improved ability to perform functional activities.    Baseline on eval (/720) score 12; 10/22/19: 12; 12/10/19: 18, 12/13: 18:  04/07/2020- Will assess next visit.    Time 12    Period Weeks    Status On-going    Target Date 06/30/20      PT LONG TERM GOAL #3   Title The patient will demonstrate lateral scooting  for assistance with transfers with minA to maximize independence.    Baseline 10/22/19: Pt requires modA+1 for lateral scooting; 12/10/19: modA+1, 12/13: modA-maxA, 12/27: modA-maxA, 03/10/2020- ModA-MaxA- increased verbal cues and visual demonstration. 04/07/2020- Continued Max assist to perform forward/lateral scoot. 04/21/2020- Patient was able to demo slight lateral scoot with CGA approx 12 in then fatigued requiring max assist- she  is able to scoot forward by leaning without significant issues.    Time 12    Period Weeks    Status On-going    Target Date 06/30/20      PT LONG TERM GOAL #4   Title The patient will demonstrate a squat pivot transfer with minimum assistance to maximize independence and mobility.    Baseline 10/22/19: unable to safely attempt at this time; 12/10/19: unable to safely attempt at this time, 01/14/20: unable to safety attempt at this time. 03/10/2020- Patient able to perform Stand pivot transfer with Maximal assist. 04/07/2020- Patient continues to require Max assist with sit to stand- will assist SPT next visit. 04/21/2020- Patient presents with Max assist to perform Sit to stand Transfer- unable to pivot or move Left LE well without difficulty.    Time 12    Period Weeks    Status On-going    Target Date 06/30/20      PT LONG TERM GOAL #5   Title The patient will demonstrate sitting without UE support for 2-5 minutes at EOB to improve participation and maximize independence with ADLs.    Baseline 10/22/19: Pt able ot sit unsupported for at least 2 minutes at edge of bed. 04/21/2020- Patient continues to demo good sitting at edge of mat > 5 miin without difficulty or back support.    Time 12    Period Weeks    Status Achieved                 Plan - 04/30/20 1151    Clinical  Impression Statement Patient experienced improved quality of standing versus last visit with improved quad control and hip extension. Patient able to perform better after cues to stand as erect as possible and position her hips over her feet.  She will would benefit from continued PT services to increase mobility and strength to improve patient's quality of life.    Personal Factors and Comorbidities Age;Time since onset of injury/illness/exacerbation;Comorbidity 3+;Fitness    Comorbidities HTN, quadriparesis, history of DVT, neurogenic bladder    Examination-Activity Limitations Bathing;Hygiene/Grooming;Squat;Bed Mobility;Lift;Bend;Stand;Engineer, manufacturing;Toileting;Self Feeding;Transfers;Continence;Sit;Dressing;Sleep;Carry    Examination-Participation Restrictions Church;Laundry;Cleaning;Medication Management;Community Activity;Meal Prep;Interpersonal Relationship    Stability/Clinical Decision Making Evolving/Moderate complexity    Rehab Potential Fair    PT Frequency 2x / week    PT Duration 12 weeks    PT Treatment/Interventions ADLs/Self Care Home Management;Electrical Stimulation;Therapeutic activities;Wheelchair mobility training;Vasopneumatic Device;Joint Manipulations;Vestibular;Passive range of motion;Patient/family education;Therapeutic exercise;DME Instruction;Biofeedback;Aquatic Therapy;Moist Heat;Gait training;Balance training;Orthotic Fit/Training;Dry needling;Energy conservation;Taping;Splinting;Neuromuscular re-education;Cryotherapy;Ultrasound;Functional mobility training    PT Next Visit Plan Continue with progressive Standing/transfer training.    PT Home Exercise Plan no changes    Consulted and Agree with Plan of Care Patient           Patient will benefit from skilled therapeutic intervention in order to improve the following deficits and impairments:  Abnormal gait,Decreased balance,Decreased endurance,Decreased mobility,Difficulty walking,Hypomobility,Impaired  sensation,Decreased range of motion,Improper body mechanics,Impaired perceived functional ability,Decreased activity tolerance,Decreased knowledge of use of DME,Decreased safety awareness,Decreased strength,Impaired flexibility,Impaired UE functional use,Postural dysfunction  Visit Diagnosis: Other lack of coordination  Muscle weakness (generalized)  Other abnormalities of gait and mobility     Problem List Patient Active Problem List   Diagnosis Date Noted  . Acute appendicitis with appendiceal abscess 05/30/2018  . Hypocalcemia 02/15/2018  . Dysuria 02/15/2018  . Bilateral lower extremity edema 02/11/2018  . Vaginal yeast infection 01/30/2018  . Chest tightness 01/27/2018  . Healthcare-associated pneumonia 01/25/2018  . Pleural effusion, not elsewhere  classified 01/25/2018  . Acute deep vein thrombosis (DVT) of left lower extremity ([place]) 01/24/2018  . Acute deep vein thrombosis (DVT) of right lower extremity ([place]) 01/24/2018  . Chronic allergic rhinitis 01/24/2018  . Depression with anxiety 01/24/2018  . UTI due to Klebsiella species 01/24/2018  . Protein-calorie malnutrition, severe ([place]) 01/24/2018  . S/P insertion of IVC (inferior vena caval) filter 01/24/2018  . Reactive depression   . Hypokalemia   . Leukocytosis   . Essential hypertension   . Trauma   . Tetraparesis ([place])   . Neuropathic pain   . Neurogenic bowel   . Neurogenic bladder   . Benign essential HTN   . Acute blood loss anemia   . Central cord syndrome at C6 level of cervical spinal cord ([place]) 11/29/2017  . Central cord syndrome [facility]) 11/29/2017    Lewis Moccasin, PT 04/30/2020, 12:30 PM  [place] MAIN [facility] SERVICES [address] [place], [place], [address] Phone: [phone]   Fax:  [phone]  Name: Sherry [name] MRN: [id number] Date of Birth: 30-Jun-1934

## 2020-05-05 ENCOUNTER — Encounter: Payer: Self-pay | Admitting: Occupational Therapy

## 2020-05-05 ENCOUNTER — Ambulatory Visit: Payer: Medicare HMO | Attending: Internal Medicine | Admitting: Occupational Therapy

## 2020-05-05 ENCOUNTER — Ambulatory Visit: Payer: Medicare HMO

## 2020-05-05 ENCOUNTER — Other Ambulatory Visit: Payer: Self-pay

## 2020-05-05 DIAGNOSIS — R2689 Other abnormalities of gait and mobility: Secondary | ICD-10-CM

## 2020-05-05 DIAGNOSIS — M6281 Muscle weakness (generalized): Secondary | ICD-10-CM

## 2020-05-05 DIAGNOSIS — R262 Difficulty in walking, not elsewhere classified: Secondary | ICD-10-CM | POA: Diagnosis present

## 2020-05-05 DIAGNOSIS — R269 Unspecified abnormalities of gait and mobility: Secondary | ICD-10-CM | POA: Diagnosis present

## 2020-05-05 DIAGNOSIS — R278 Other lack of coordination: Secondary | ICD-10-CM | POA: Diagnosis present

## 2020-05-05 NOTE — Therapy (Signed)
[place] MAIN [facility] SERVICES [address] [place], [place], [address] Phone: [phone]   Fax:  [phone]  Occupational Therapy Treatment  Patient Details  Name: Sherry [name] MRN: [id number] Date of Birth: November 20, 1934 No data recorded  Encounter Date: 05/05/2020   OT End of Session - 05/05/20 1111    Visit Number 10    Number of Visits 79    Date for OT Re-Evaluation 05/21/20    Authorization Time Period Progress report period starting 04/02/2020    OT Start Time 1104    OT Stop Time 1145    OT Time Calculation (min) 41 min    Activity Tolerance Patient tolerated treatment well    Behavior During Therapy [facility] for tasks assessed/performed           Past Medical History:  Diagnosis Date  . Acute blood loss anemia   . Arthritis   . Cancer ([place])    skin  . Central cord syndrome at C6 level of cervical spinal cord ([place]) 11/29/2017  . Hypertension   . Protein-calorie malnutrition, severe ([place]) 01/24/2018  . S/P insertion of IVC (inferior vena caval) filter 01/24/2018  . Tetraparesis [facility])     Past Surgical History:  Procedure Laterality Date  . ANTERIOR CERVICAL DECOMP/DISCECTOMY FUSION N/A 11/29/2017   Procedure: Cervical five-six, six-seven Anterior Cervical Decompression Fusion;  Surgeon: Judith Part, MD;  Location: [place];  Service: Neurosurgery;  Laterality: N/A;  Cervical five-six, six-seven Anterior Cervical Decompression Fusion  . CATARACT EXTRACTION    . IR IVC FILTER PLMT / S&I /IMG GUID/MOD SED  12/08/2017    There were no vitals filed for this visit.   Subjective Assessment - 05/05/20 1110    Subjective  Pt denies pain and reports feeling good today.    Patient is accompanied by: Family member    Pertinent History Patient s/p fall November 29, 2017 resulting in diagnosis of central cord syndrome at C 6 level.  She has had therapy in multiple venues but with recent move to [facility], therapy staff recommended  she seek outpatient therapy for her needs.           OT Treatment  Pt.tolerated bilateral shoulder flexion, extension, abduction, elbow flexion, extension forearm supination AAROM/AROM. PROM bilateral wrist extension, digit MP, PIP, and DIP flexion, and extension in conjunction with moist heat.Pt. worked on bilateral UE reaching with the right and Left UE using the horizontal [employer]. Pt.worked on reaching with incorporating her core to improve trunk control while reaching up, and sliding them to the right through 2 horizontal rungs to the right, and 3 rungs to the left.  Pt.was able to able to move the Sa[place]o rings through 3 horizontal rungs to the left, and 2 horizontal rungs to the right with bilateral hand coordination.Pt.continues to engageher right hand whenperforming self-feeding with adpativeequipment.Pt. reports that she is able to use her right thumb to hold items. Pt. tolerated ROM well without reports of pain, or discomfort. Pt.continues to benefit from working on improving ROM in order to work towards increasing engagement of her bilateral hands during ADLs, and IADL tasks.                        OT Education - 05/05/20 1111    Education Details BUE ROM with functional reaching    Person(s) Educated Patient;Caregiver(s)    Methods Explanation;Demonstration    Comprehension Verbalized understanding;Need further instruction  OT Long Term Goals - 04/02/20 1549      OT LONG TERM GOAL #1   Title Patient and caregiver will demonstrate understanding of home exercise program for ROM.    Baseline Pt. continues to have a restorative aide rehab aide assist her wih exercises at [facility]    Time 12    Period Weeks    Status On-going    Target Date 05/21/20      OT LONG TERM GOAL #2   Title Patient will demonstrate ability to don shirt with min assist from seated position.    Baseline Pt. is indepedent with a larger shirt,  and continues to require increased time to complete    Time 12    Period Weeks    Status On-going    Target Date 05/21/20      OT LONG TERM GOAL #4   Title Patient will demonstrate improved composite finger flexion to hold deodorant to apply to underarms.    Baseline Pt. continues to present with improving finger flexion of R hand for a partial gross grasp, but unable to grasp and hold items. Very limited digit flexion of L hand. Pt. has active left thumb abduction.    Time 12    Period Weeks    Status On-going    Target Date 05/21/20      OT LONG TERM GOAL #6   Title Patient will increase right UE ROM to comb the back of her hair with modified independence.    Baseline Pt. continues to be able to comb the front, and sides of her head. Pt. can now reach the back, however cannot see what she is doing in the back of her head.    Time 12    Period Weeks    Status Partially Met    Target Date 05/21/20      OT LONG TERM GOAL #8   Title Pt. will write, and sign her name with 100% legibility, and modified independence.    Baseline Pt. conitnues to  consistently filling out her daily menu, and writing for crossword puzzles. Pt. is able to maintain grasp on a wide width pen. [place] to work on [employer].    Time 12    Period Weeks    Status On-going    Target Date 05/21/20                 Plan - 05/05/20 1112    Clinical Impression Statement Pt.was able to able to move the Sa[employer] rings through 3 horizontal rungs to the left, and 2 horizontal rungs to the right with bilateral hand coordination.Pt.continues to engageher right hand whenperforming self-feeding with adpativeequipment.Pt. reports that she is able to use her right thumb to hold items. Pt. tolerated ROM well without reports of pain, or discomfort. Pt.continues to benefit from working on improving ROM in order to work towards increasing engagement of her bilateral hands during ADLs, and IADL tasks.   OT  Occupational Profile and History Detailed Assessment- Review of Records and additional review of physical, cognitive, psychosocial history related to current functional performance    Occupational performance deficits (Please refer to evaluation for details): ADL's;IADL's;Leisure;Social Participation    Body Structure / Function / Physical Skills ADL;Continence;Dexterity;Flexibility;Strength;ROM;Balance;Coordination;FMC;IADL;Endurance;UE functional use;Decreased knowledge of use of DME;GMC    Psychosocial Skills Environmental  Adaptations;Habits;Routines and Behaviors    Rehab Potential Fair    Clinical Decision Making Limited treatment options, no task modification necessary    Comorbidities Affecting Occupational  Performance: Presence of comorbidities impacting occupational performance    Comorbidities impacting occupational performance description: contractures of bilateral hands, dependent transfers,    Modification or Assistance to Complete Evaluation  No modification of tasks or assist necessary to complete eval    OT Frequency 2x / week    OT Duration 12 weeks    OT Treatment/Interventions Self-care/ADL training;Cryotherapy;Therapeutic exercise;DME and/or AE instruction;Balance training;Neuromuscular education;Manual Therapy;Splinting;Moist Heat;Passive range of motion;Therapeutic activities;Patient/family education    Consulted and Agree with Plan of Care Patient           Patient will benefit from skilled therapeutic intervention in order to improve the following deficits and impairments:   Body Structure / Function / Physical Skills: ADL,Continence,Dexterity,Flexibility,Strength,ROM,Balance,Coordination,FMC,IADL,Endurance,UE functional use,Decreased knowledge of use of [facility]   Psychosocial Skills: Environmental  Adaptations,Habits,Routines and Behaviors   Visit Diagnosis: Muscle weakness (generalized)    Problem List Patient Active Problem List   Diagnosis Date Noted  .  Acute appendicitis with appendiceal abscess 05/30/2018  . Hypocalcemia 02/15/2018  . Dysuria 02/15/2018  . Bilateral lower extremity edema 02/11/2018  . Vaginal yeast infection 01/30/2018  . Chest tightness 01/27/2018  . Healthcare-associated pneumonia 01/25/2018  . Pleural effusion, not elsewhere classified 01/25/2018  . Acute deep vein thrombosis (DVT) of left lower extremity ([place]) 01/24/2018  . Acute deep vein thrombosis (DVT) of right lower extremity ([place]) 01/24/2018  . Chronic allergic rhinitis 01/24/2018  . Depression with anxiety 01/24/2018  . UTI due to Klebsiella species 01/24/2018  . Protein-calorie malnutrition, severe ([place]) 01/24/2018  . S/P insertion of IVC (inferior vena caval) filter 01/24/2018  . Reactive depression   . Hypokalemia   . Leukocytosis   . Essential hypertension   . Trauma   . Tetraparesis ([place])   . Neuropathic pain   . Neurogenic bowel   . Neurogenic bladder   . Benign essential HTN   . Acute blood loss anemia   . Central cord syndrome at C6 level of cervical spinal cord ([place]) 11/29/2017  . Central cord syndrome [facility]) 11/29/2017    Harrel Carina ,MS, OTR/L 05/05/2020, 11:13 AM  [place] MAIN [facility] SERVICES [address] [place], [place], [address] Phone: [phone]   Fax:  [phone]  Name: Sherry [name] MRN: [id number] Date of Birth: [birth year]

## 2020-05-05 NOTE — Therapy (Signed)
[place] MAIN [facility] SERVICES [address] [place], [place], [address] Phone: [phone]   Fax:  [phone]  Physical Therapy Treatment  Patient Details  Name: Sherry [name] MRN: [id number] Date of Birth: 02-12-1934 Referring Provider (PT): Viviana Simpler   Encounter Date: 05/05/2020   PT End of Session - 05/05/20 0817    Visit Number 44    Number of Visits 79    Date for PT Re-Evaluation 06/30/20    PT Start Time 0814    PT Stop Time 1221    PT Time Calculation (min) 36 min    Equipment Utilized During Treatment Gait belt    Activity Tolerance Patient tolerated treatment well;Patient limited by fatigue    Behavior During Therapy [facility] for tasks assessed/performed           Past Medical History:  Diagnosis Date  . Acute blood loss anemia   . Arthritis   . Cancer ([place])    skin  . Central cord syndrome at C6 level of cervical spinal cord ([place]) 11/29/2017  . Hypertension   . Protein-calorie malnutrition, severe ([place]) 01/24/2018  . S/P insertion of IVC (inferior vena caval) filter 01/24/2018  . Tetraparesis [facility])     Past Surgical History:  Procedure Laterality Date  . ANTERIOR CERVICAL DECOMP/DISCECTOMY FUSION N/A 11/29/2017   Procedure: Cervical five-six, six-seven Anterior Cervical Decompression Fusion;  Surgeon: Judith Part, MD;  Location: [place];  Service: Neurosurgery;  Laterality: N/A;  Cervical five-six, six-seven Anterior Cervical Decompression Fusion  . CATARACT EXTRACTION    . IR IVC FILTER PLMT / S&I /IMG GUID/MOD SED  12/08/2017    There were no vitals filed for this visit.   Subjective Assessment - 05/05/20 1150    Subjective Patient reports feeling good today and states she had a great weekend.    Patient is accompained by: --   [name] (aide)   Pertinent History Pt is an 86 yo female that fell in 2019, fractured vertebrae in her neck and in her low back per family. Per chart, pt experienced incomplete  quadiparesis at level C6. After hospital stay, pt discharged to [facility] for ~1 month, experienced severe UTI as well as bilateral DVT (IVC filter placed). Discharged to [facility], stayed for about a year, and then moved to [facility] in April 2021. Was receiving PT, but per family facility reported that did not have adequate equipment to maximize PT for her. Pt until about 1 month ago was practicing sit to stand transfers with 1-2 people, and working on static standing. Has a brace for L foot due to PF. Pt currently needs assistance with all ADLs (able to assist with donning/doffing her shirt), bed baths, and needs a hoyer lift for transfers. Able to drive power wheelchair without assistance.    Limitations Standing;Walking;House hold activities;Lifting    How long can you sit comfortably? NA    How long can you stand comfortably? unable    How long can you walk comfortably? unable    Patient Stated Goals "to stand up and walk"    Pain Onset 1 to 4 weeks ago                Interventions:    Seated LE Strengthening- LAQ with eccentric control x 12 reps BLE- Cues for eccentric control with limited ability on right LE  Standing trials:  1) 2 min 20 sec - Patient able to stand with less physical assist - approx 50 %  VC to maintain erect posture.    2) 1 min 12  sec-  Same as round 1 but stopped due to weakness 3) 45  Sec- Same as above just unable to maintain- stopped secondary to fatigue.  4) 53 sec Patient required more assist to stand and maintain standing posture- more fatigued Patient requested to end session due to fatigue.  Clinical Impression: Patient presents with more consistent standing trials today and less overall physical assist - approx 50% except for last trial. She was able to exhibit some eccentric control with Right and Left LE with seated knee extensions as well. She will would benefit from continued PT services to increase mobility and strength to improve patient's quality  of life.                        PT Education - 05/06/20 0817    Education Details Postural cues and eccentric control with strengthening.    Person(s) Educated Patient    Methods Explanation;Demonstration;Tactile cues;Verbal cues    Comprehension Verbalized understanding;Tactile cues required;Returned demonstration;Need further instruction;Verbal cues required            PT Short Term Goals - 03/10/20 2211      PT SHORT TERM GOAL #1   Title The patient will perform initial HEP with minimum assistance in order to improve strength and function.    Baseline Patient demonstrating independence with initial HEP as of 03/10/2020 with no questions or difficulty.    Time 4    Period Weeks    Status Achieved    Target Date 02/11/20             PT Long Term Goals - 04/21/20 1301      PT LONG TERM GOAL #1   Title The patient will be compliant with finalized HEP with minimum assistance in preparation for self management and maintenance of condition.    Baseline 03/10/2020- Patient familiar with initial HEP and will keep goal active as HEP becomes more progressive including possible transfers and standing activities. 04/07/2020- Patient continues to report compliance with LE strengthening home exercises without questions or concerns. 04/21/2020- Patient able to verbalize and demonstrate good understanding of current HEP including some supine and seated LE strengthening exercises. Reviewed and patient performed 10 reps today with minimal cueing. Will keep goal active to progress HEP as appropriate.    Time 12    Period Weeks    Status On-going    Target Date 06/30/20      PT LONG TERM GOAL #2   Title The patient will demonstrate at least 10 point improvement on FOTO score indicating an improved ability to perform functional activities.    Baseline on eval (/720) score 12; 10/22/19: 12; 12/10/19: 18, 12/13: 18: 04/07/2020- Will assess next visit.    Time 12    Period Weeks     Status On-going    Target Date 06/30/20      PT LONG TERM GOAL #3   Title The patient will demonstrate lateral scooting  for assistance with transfers with minA to maximize independence.    Baseline 10/22/19: Pt requires modA+1 for lateral scooting; 12/10/19: modA+1, 12/13: modA-maxA, 12/27: modA-maxA, 03/10/2020- ModA-MaxA- increased verbal cues and visual demonstration. 04/07/2020- Continued Max assist to perform forward/lateral scoot. 04/21/2020- Patient was able to demo slight lateral scoot with CGA approx 12 in then fatigued requiring max assist- she is able to scoot forward by leaning without significant issues.  Time 12    Period Weeks    Status On-going    Target Date 06/30/20      PT LONG TERM GOAL #4   Title The patient will demonstrate a squat pivot transfer with minimum assistance to maximize independence and mobility.    Baseline 10/22/19: unable to safely attempt at this time; 12/10/19: unable to safely attempt at this time, 01/14/20: unable to safety attempt at this time. 03/10/2020- Patient able to perform Stand pivot transfer with Maximal assist. 04/07/2020- Patient continues to require Max assist with sit to stand- will assist SPT next visit. 04/21/2020- Patient presents with Max assist to perform Sit to stand Transfer- unable to pivot or move Left LE well without difficulty.    Time 12    Period Weeks    Status On-going    Target Date 06/30/20      PT LONG TERM GOAL #5   Title The patient will demonstrate sitting without UE support for 2-5 minutes at EOB to improve participation and maximize independence with ADLs.    Baseline 10/22/19: Pt able ot sit unsupported for at least 2 minutes at edge of bed. 04/21/2020- Patient continues to demo good sitting at edge of mat > 5 miin without difficulty or back support.    Time 12    Period Weeks    Status Achieved                 Plan - 05/05/20 0818    Clinical Impression Statement Patient presents with more consistent standing  trials today and less overall physical assist - approx 50% except for last trial. She was able to exhibit some eccentric control with Right and Left LE with seated knee extensions as well. She will would benefit from continued PT services to increase mobility and strength to improve patient's quality of life.    Personal Factors and Comorbidities Age;Time since onset of injury/illness/exacerbation;Comorbidity 3+;Fitness    Comorbidities HTN, quadriparesis, history of DVT, neurogenic bladder    Examination-Activity Limitations Bathing;Hygiene/Grooming;Squat;Bed Mobility;Lift;Bend;Stand;Engineer, manufacturing;Toileting;Self Feeding;Transfers;Continence;Sit;Dressing;Sleep;Carry    Examination-Participation Restrictions Church;Laundry;Cleaning;Medication Management;Community Activity;Meal Prep;Interpersonal Relationship    Stability/Clinical Decision Making Evolving/Moderate complexity    Rehab Potential Fair    PT Frequency 2x / week    PT Duration 12 weeks    PT Treatment/Interventions ADLs/Self Care Home Management;Electrical Stimulation;Therapeutic activities;Wheelchair mobility training;Vasopneumatic Device;Joint Manipulations;Vestibular;Passive range of motion;Patient/family education;Therapeutic exercise;DME Instruction;Biofeedback;Aquatic Therapy;Moist Heat;Gait training;Balance training;Orthotic Fit/Training;Dry needling;Energy conservation;Taping;Splinting;Neuromuscular re-education;Cryotherapy;Ultrasound;Functional mobility training    PT Next Visit Plan Continue with progressive Standing/transfer training.    PT Home Exercise Plan no changes    Consulted and Agree with Plan of Care Patient           Patient will benefit from skilled therapeutic intervention in order to improve the following deficits and impairments:  Abnormal gait,Decreased balance,Decreased endurance,Decreased mobility,Difficulty walking,Hypomobility,Impaired sensation,Decreased range of motion,Improper body  mechanics,Impaired perceived functional ability,Decreased activity tolerance,Decreased knowledge of use of DME,Decreased safety awareness,Decreased strength,Impaired flexibility,Impaired UE functional use,Postural dysfunction  Visit Diagnosis: Muscle weakness (generalized)  Other lack of coordination  Other abnormalities of gait and mobility     Problem List Patient Active Problem List   Diagnosis Date Noted  . Acute appendicitis with appendiceal abscess 05/30/2018  . Hypocalcemia 02/15/2018  . Dysuria 02/15/2018  . Bilateral lower extremity edema 02/11/2018  . Vaginal yeast infection 01/30/2018  . Chest tightness 01/27/2018  . Healthcare-associated pneumonia 01/25/2018  . Pleural effusion, not elsewhere classified 01/25/2018  . Acute deep vein thrombosis (DVT) of left  lower extremity ([place]) 01/24/2018  . Acute deep vein thrombosis (DVT) of right lower extremity ([place]) 01/24/2018  . Chronic allergic rhinitis 01/24/2018  . Depression with anxiety 01/24/2018  . UTI due to Klebsiella species 01/24/2018  . Protein-calorie malnutrition, severe ([place]) 01/24/2018  . S/P insertion of IVC (inferior vena caval) filter 01/24/2018  . Reactive depression   . Hypokalemia   . Leukocytosis   . Essential hypertension   . Trauma   . Tetraparesis ([place])   . Neuropathic pain   . Neurogenic bowel   . Neurogenic bladder   . Benign essential HTN   . Acute blood loss anemia   . Central cord syndrome at C6 level of cervical spinal cord ([place]) 11/29/2017  . Central cord syndrome [facility]) 11/29/2017    Lewis Moccasin, PT 05/06/2020, 8:25 AM  [place] MAIN [facility] SERVICES [address] [place], [place], [address] Phone: [phone]   Fax:  [phone]  Name: Sherry [name] MRN: [id number] Date of Birth: [birth year]

## 2020-05-07 ENCOUNTER — Ambulatory Visit: Payer: Medicare HMO | Admitting: Occupational Therapy

## 2020-05-07 ENCOUNTER — Encounter: Payer: Self-pay | Admitting: Occupational Therapy

## 2020-05-07 ENCOUNTER — Ambulatory Visit: Payer: Medicare HMO

## 2020-05-07 ENCOUNTER — Other Ambulatory Visit: Payer: Self-pay

## 2020-05-07 DIAGNOSIS — R2689 Other abnormalities of gait and mobility: Secondary | ICD-10-CM

## 2020-05-07 DIAGNOSIS — R278 Other lack of coordination: Secondary | ICD-10-CM

## 2020-05-07 DIAGNOSIS — M6281 Muscle weakness (generalized): Secondary | ICD-10-CM | POA: Diagnosis not present

## 2020-05-07 NOTE — Therapy (Signed)
[place] MAIN [facility] SERVICES [address] [place], [place], [address] Phone: [phone]   Fax:  [phone]  Physical Therapy Treatment  Patient Details  Name: Sherry [name] MRN: [id number] Date of Birth: 12/27/1934 Referring Provider (PT): Viviana Simpler   Encounter Date: 05/07/2020   PT End of Session - 05/07/20 1153    Visit Number 54    Number of Visits 15    Date for PT Re-Evaluation 06/30/20    PT Start Time 1148    PT Stop Time 1220    PT Time Calculation (min) 32 min    Equipment Utilized During Treatment Gait belt    Activity Tolerance Patient tolerated treatment well;Patient limited by fatigue    Behavior During Therapy [facility] for tasks assessed/performed           Past Medical History:  Diagnosis Date  . Acute blood loss anemia   . Arthritis   . Cancer ([place])    skin  . Central cord syndrome at C6 level of cervical spinal cord ([place]) 11/29/2017  . Hypertension   . Protein-calorie malnutrition, severe ([place]) 01/24/2018  . S/P insertion of IVC (inferior vena caval) filter 01/24/2018  . Tetraparesis [facility])     Past Surgical History:  Procedure Laterality Date  . ANTERIOR CERVICAL DECOMP/DISCECTOMY FUSION N/A 11/29/2017   Procedure: Cervical five-six, six-seven Anterior Cervical Decompression Fusion;  Surgeon: Judith Part, MD;  Location: [place];  Service: Neurosurgery;  Laterality: N/A;  Cervical five-six, six-seven Anterior Cervical Decompression Fusion  . CATARACT EXTRACTION    . IR IVC FILTER PLMT / S&I /IMG GUID/MOD SED  12/08/2017    There were no vitals filed for this visit.   Subjective Assessment - 05/07/20 1152    Subjective Patient reports feeling good today except slightly depressed because the [place] lost.    Patient is accompained by: --   [name] (aide)   Pertinent History Pt is an 86 yo female that fell in 2019, fractured vertebrae in her neck and in her low back per family. Per chart, pt  experienced incomplete quadiparesis at level C6. After hospital stay, pt discharged to CIR for ~1 month, experienced severe UTI as well as bilateral DVT (IVC filter placed). Discharged to [facility], stayed for about a year, and then moved to [facility] in April 2021. Was receiving PT, but per family facility reported that did not have adequate equipment to maximize PT for her. Pt until about 1 month ago was practicing sit to stand transfers with 1-2 people, and working on static standing. Has a brace for L foot due to PF. Pt currently needs assistance with all ADLs (able to assist with donning/doffing her shirt), bed baths, and needs a hoyer lift for transfers. Able to drive power wheelchair without assistance.    Limitations Standing;Walking;House hold activities;Lifting    How long can you sit comfortably? NA    How long can you stand comfortably? unable    How long can you walk comfortably? unable    Patient Stated Goals "to stand up and walk"    Currently in Pain? No/denies    Pain Onset 1 to 4 weeks ago             Interventions:    Seated LE Strengthening AAROM B Hip Flex- 20 reps each (trying to move LE through full ROM)  LAQ with eccentric control x 12 reps BLE- Cues for eccentric control with limited ability on right LE  Standing trials:  1) 3 min 10 sec - Mod A - approx 50 % VC to maintain erect posture.   Patient rated 5/10 on RPE scale.  2) 27  sec-  Right knee buckling and unable to correct. 3) 48 sec- Difficulty achieving erect posture and left knee buckling.  Patient too fatigued to continue with standing.      Clinical Impression: Patient presents her best individual standing endurance trial since static standing was initiated. She fatigued out quickly after that with B knee buckling and undo fatigue and requested to end session after 3 trials with rest breaks between. She will would benefit from continued PT services to increase mobility and strength to improve  patient's quality of life.                  `                         PT Education - 05/07/20 1152    Education Details Postural cues with standing.    Person(s) Educated Patient    Methods Demonstration;Tactile cues;Verbal cues;Explanation    Comprehension Verbalized understanding;Returned demonstration;Verbal cues required;Tactile cues required;Need further instruction            PT Short Term Goals - 03/10/20 2211      PT SHORT TERM GOAL #1   Title The patient will perform initial HEP with minimum assistance in order to improve strength and function.    Baseline Patient demonstrating independence with initial HEP as of 03/10/2020 with no questions or difficulty.    Time 4    Period Weeks    Status Achieved    Target Date 02/11/20             PT Long Term Goals - 04/21/20 1301      PT LONG TERM GOAL #1   Title The patient will be compliant with finalized HEP with minimum assistance in preparation for self management and maintenance of condition.    Baseline 03/10/2020- Patient familiar with initial HEP and will keep goal active as HEP becomes more progressive including possible transfers and standing activities. 04/07/2020- Patient continues to report compliance with LE strengthening home exercises without questions or concerns. 04/21/2020- Patient able to verbalize and demonstrate good understanding of current HEP including some supine and seated LE strengthening exercises. Reviewed and patient performed 10 reps today with minimal cueing. Will keep goal active to progress HEP as appropriate.    Time 12    Period Weeks    Status On-going    Target Date 06/30/20      PT LONG TERM GOAL #2   Title The patient will demonstrate at least 10 point improvement on FOTO score indicating an improved ability to perform functional activities.    Baseline on eval (/720) score 12; 10/22/19: 12; 12/10/19: 18, 12/13: 18: 04/07/2020- Will assess next visit.     Time 12    Period Weeks    Status On-going    Target Date 06/30/20      PT LONG TERM GOAL #3   Title The patient will demonstrate lateral scooting  for assistance with transfers with minA to maximize independence.    Baseline 10/22/19: Pt requires modA+1 for lateral scooting; 12/10/19: modA+1, 12/13: modA-maxA, 12/27: modA-maxA, 03/10/2020- ModA-MaxA- increased verbal cues and visual demonstration. 04/07/2020- Continued Max assist to perform forward/lateral scoot. 04/21/2020- Patient was able to demo slight lateral scoot with CGA approx 12 in then fatigued requiring max assist- she  is able to scoot forward by leaning without significant issues.    Time 12    Period Weeks    Status On-going    Target Date 06/30/20      PT LONG TERM GOAL #4   Title The patient will demonstrate a squat pivot transfer with minimum assistance to maximize independence and mobility.    Baseline 10/22/19: unable to safely attempt at this time; 12/10/19: unable to safely attempt at this time, 01/14/20: unable to safety attempt at this time. 03/10/2020- Patient able to perform Stand pivot transfer with Maximal assist. 04/07/2020- Patient continues to require Max assist with sit to stand- will assist SPT next visit. 04/21/2020- Patient presents with Max assist to perform Sit to stand Transfer- unable to pivot or move Left LE well without difficulty.    Time 12    Period Weeks    Status On-going    Target Date 06/30/20      PT LONG TERM GOAL #5   Title The patient will demonstrate sitting without UE support for 2-5 minutes at EOB to improve participation and maximize independence with ADLs.    Baseline 10/22/19: Pt able ot sit unsupported for at least 2 minutes at edge of bed. 04/21/2020- Patient continues to demo good sitting at edge of mat > 5 miin without difficulty or back support.    Time 12    Period Weeks    Status Achieved                 Plan - 05/07/20 1153    Clinical Impression Statement Patient presents  her best individual standing endurance trial since static standing was initiated. She fatigued out quickly after that with B knee buckling and undo fatigue and requested to end session after 3 trials with rest breaks between. She will would benefit from continued PT services to increase mobility and strength to improve patient's quality of life.    Personal Factors and Comorbidities Age;Time since onset of injury/illness/exacerbation;Comorbidity 3+;Fitness    Comorbidities HTN, quadriparesis, history of DVT, neurogenic bladder    Examination-Activity Limitations Bathing;Hygiene/Grooming;Squat;Bed Mobility;Lift;Bend;Stand;Engineer, manufacturing;Toileting;Self Feeding;Transfers;Continence;Sit;Dressing;Sleep;Carry    Examination-Participation Restrictions Church;Laundry;Cleaning;Medication Management;Community Activity;Meal Prep;Interpersonal Relationship    Stability/Clinical Decision Making Evolving/Moderate complexity    Rehab Potential Fair    PT Frequency 2x / week    PT Duration 12 weeks    PT Treatment/Interventions ADLs/Self Care Home Management;Electrical Stimulation;Therapeutic activities;Wheelchair mobility training;Vasopneumatic Device;Joint Manipulations;Vestibular;Passive range of motion;Patient/family education;Therapeutic exercise;DME Instruction;Biofeedback;Aquatic Therapy;Moist Heat;Gait training;Balance training;Orthotic Fit/Training;Dry needling;Energy conservation;Taping;Splinting;Neuromuscular re-education;Cryotherapy;Ultrasound;Functional mobility training    PT Next Visit Plan Continue with progressive Standing/transfer training.    PT Home Exercise Plan no changes    Consulted and Agree with Plan of Care Patient           Patient will benefit from skilled therapeutic intervention in order to improve the following deficits and impairments:  Abnormal gait,Decreased balance,Decreased endurance,Decreased mobility,Difficulty walking,Hypomobility,Impaired  sensation,Decreased range of motion,Improper body mechanics,Impaired perceived functional ability,Decreased activity tolerance,Decreased knowledge of use of DME,Decreased safety awareness,Decreased strength,Impaired flexibility,Impaired UE functional use,Postural dysfunction  Visit Diagnosis: Muscle weakness (generalized)  Other lack of coordination  Other abnormalities of gait and mobility     Problem List Patient Active Problem List   Diagnosis Date Noted  . Acute appendicitis with appendiceal abscess 05/30/2018  . Hypocalcemia 02/15/2018  . Dysuria 02/15/2018  . Bilateral lower extremity edema 02/11/2018  . Vaginal yeast infection 01/30/2018  . Chest tightness 01/27/2018  . Healthcare-associated pneumonia 01/25/2018  . Pleural effusion, not  elsewhere classified 01/25/2018  . Acute deep vein thrombosis (DVT) of left lower extremity ([place]) 01/24/2018  . Acute deep vein thrombosis (DVT) of right lower extremity ([place]) 01/24/2018  . Chronic allergic rhinitis 01/24/2018  . Depression with anxiety 01/24/2018  . UTI due to Klebsiella species 01/24/2018  . Protein-calorie malnutrition, severe ([place]) 01/24/2018  . S/P insertion of IVC (inferior vena caval) filter 01/24/2018  . Reactive depression   . Hypokalemia   . Leukocytosis   . Essential hypertension   . Trauma   . Tetraparesis ([place])   . Neuropathic pain   . Neurogenic bowel   . Neurogenic bladder   . Benign essential HTN   . Acute blood loss anemia   . Central cord syndrome at C6 level of cervical spinal cord ([place]) 11/29/2017  . Central cord syndrome [facility]) 11/29/2017    Lewis Moccasin, PT 05/08/2020, 9:43 AM  [place] MAIN [facility] SERVICES [address] [place], [place], [address] Phone: [phone]   Fax:  [phone]  Name: Sherry [name] MRN: [id number] Date of Birth: 05/13/1934

## 2020-05-07 NOTE — Therapy (Signed)
[place] [address] [place], [place], [address] Phone: [phone]   Fax:  [phone]  Occupational Therapy Treatment/Recertication/ Occupational Therapy Progress Note  Dates of reporting period  04/02/2020  to   05/07/2020  Patient Details  Name: Sherry [name] MRN: [id number] Date of Birth: Apr 30, 1934 No data recorded  Encounter Date: 05/07/2020   OT End of Session - 05/07/20 1111    Visit Number 50    Number of Visits 65    Date for OT Re-Evaluation 07/30/20    Authorization Time Period Progress report period starting 05/07/2020    OT Start Time 1105    OT Stop Time 1145    OT Time Calculation (min) 40 min    Equipment Utilized During Treatment moist heat    Activity Tolerance Patient tolerated treatment well    Behavior During Therapy [facility] for tasks assessed/performed           Past Medical History:  Diagnosis Date  . Acute blood loss anemia   . Arthritis   . Cancer ([place])    skin  . Central cord syndrome at C6 level of cervical spinal cord ([place]) 11/29/2017  . Hypertension   . Protein-calorie malnutrition, severe ([place]) 01/24/2018  . S/P insertion of IVC (inferior vena caval) filter 01/24/2018  . Tetraparesis [facility])     Past Surgical History:  Procedure Laterality Date  . ANTERIOR CERVICAL DECOMP/DISCECTOMY FUSION N/A 11/29/2017   Procedure: Cervical five-six, six-seven Anterior Cervical Decompression Fusion;  Surgeon: Judith Part, MD;  Location: [place];  Service: Neurosurgery;  Laterality: N/A;  Cervical five-six, six-seven Anterior Cervical Decompression Fusion  . CATARACT EXTRACTION    . IR IVC FILTER PLMT / S&I /IMG GUID/MOD SED  12/08/2017    There were no vitals filed for this visit.   Subjective Assessment - 05/07/20 1110    Subjective  Pt denies pain and reports feeling good today.    Patient is accompanied by: Family member    Pertinent History Patient s/p fall November 29, 2017  resulting in diagnosis of central cord syndrome at C 6 level.  She has had therapy in multiple venues but with recent move to [facility], therapy staff recommended she seek outpatient therapy for her needs.    Currently in Pain? No/denies              [facility] OT Assessment - 05/07/20 0001      AROM   Right Shoulder Flexion 104 Degrees    Right Shoulder ABduction 94 Degrees    Left Shoulder Flexion 138 Degrees    Left Shoulder ABduction 96 Degrees    Right Elbow Flexion 150    Right Elbow Extension -5    Left Elbow Flexion 150    Left Elbow Extension 0    Right Forearm Pronation 70 Degrees    Right Forearm Supination 90 Degrees    Left Forearm Pronation 65 Degrees    Left Forearm Supination 90 Degrees    Right Wrist Extension 30 Degrees    Right Wrist Flexion 54 Degrees    Right Wrist Radial Deviation 11 Degrees    Right Wrist Ulnar Deviation 15 Degrees    Left Wrist Extension -5 Degrees    Left Wrist Flexion 70 Degrees    Left Wrist Radial Deviation 20 Degrees    Left Wrist Ulnar Deviation 15 Degrees      Right Hand PROM   R Index  MCP 0-90 90 Degrees  R Index PIP 0-100 80 Degrees    R Index DIP 0-70 30 Degrees    R Long  MCP 0-90 95 Degrees    R Long PIP 0-100 80 Degrees    R Long DIP 0-70 20 Degrees    R Ring  MCP 0-90 95 Degrees    R Ring PIP 0-100 85 Degrees    R Ring DIP 0-70 40 Degrees    R Little  MCP 0-90 75 Degrees    R Little PIP 0-100 40 Degrees    R Little DIP 0-70 20 Degrees      Left Hand PROM   L Index  MCP 0-90 45 Degrees    L Index PIP 0-100 15 Degrees    L Index DIP 0-70 40 Degrees    L Long  MCP 0-90 15 Degrees    L Long PIP 0-100 60 Degrees    L Long DIP 0-70 35 Degrees    L Ring  MCP 0-90 25 Degrees    L Ring PIP 0-100 35 Degrees    L Ring DIP 0-70 80 Degrees    L Little  MCP 0-90 10 Degrees    L Little PIP 0-100 20 Degrees    L Little DIP 0-70 30 Degrees          Measurements were obtained, and goals were reviewed with the pt. Pt.  has made progress with AROM in bilateral shoulder flexion, abduction, elbow extension, wrist extension, radial, and ulnar deviation. Pt. is now able to perform UE dressing skills, self- feeding using a fork without spillage, and is able to brush her teeth. Pt. continues to work on improving functional reaching with her bilateral UEs. Pt. Continues to work on improving digit PROM, and AROM in order to be able to formulate a fist, and hold items. Pt. Continues to work on improving UE strength, and [facility] skills in order to work towards improving, and maximizing independence with ADLs, and  IADLs.                  OT Education - 05/07/20 1111    Education Details BUE ROM with functional reaching    Person(s) Educated Patient;Caregiver(s)    Methods Explanation;Demonstration    Comprehension Verbalized understanding;Need further instruction               OT Long Term Goals - 05/07/20 1137      OT LONG TERM GOAL #1   Title Patient and caregiver will demonstrate understanding of home exercise program for ROM.    Baseline Pt. continues to have a restorative aide rehab aide assist her wih exercises at [facility]    Time 12    Period Weeks    Status On-going    Target Date 07/30/20      OT LONG TERM GOAL #2   Title Patient will demonstrate ability to don shirt with min assist from seated position.    Baseline Pt. is indepedent with a larger shirt, and continues to require increased time to complete,    Time 12    Period Weeks    Status Achieved      OT LONG TERM GOAL #4   Baseline Pt. continues to present with improving finger flexion of R hand for a partial gross grasp, but unable to grasp and hold items. Very limited digit flexion of L hand. Pt. has active left thumb abduction.    Time 12    Period Weeks    Status  On-going    Target Date 07/30/20      OT LONG TERM GOAL #5   Title Pt will complete self feeding with setup, use of universal cuff and minimal spillage.     Baseline inconsistent with feeding and increased spillage. Pt is able to use a fork without spillage. Pt. has spillage with a spoon.    Time 12    Period Weeks    Status Achieved      OT LONG TERM GOAL #6   Title Patient will increase right UE ROM to comb the back of her hair with modified independence.    Baseline Pt. continues to be able to comb the front, and sides of her head. Pt. can reach the back, however cannot see what she is doing in the back of her head.    Time 12    Period Weeks    Status Partially Met    Target Date 07/30/20      OT LONG TERM GOAL #8   Title Pt. will write, and sign her name with 100% legibility, and modified independence.    Baseline Pt. continues to  consistently filling out her daily menu, and writing for crossword puzzles. Pt. is able to maintain grasp on a wide width pen. [place] to work on Media planner.    Time 12    Period Weeks    Status On-going    Target Date 07/30/20                 Plan - 05/07/20 1112    Clinical Impression Statement Measurements were obtained, and goals were reviewed with the pt. Pt. has made progress with AROM in bilateral shoulder flexion, abduction, elbow extension, wrist extension, radial, and ulnar deviation. Pt. is now able to perform UE dressing skills, self- feeding using a fork without spillage, and is able to brush her teeth. Pt. continues to work on improving functional reaching with her bilateral UEs. Pt. Continues to work on improving digit PROM, and AROM in order to be able to formulate a fist, and hold items. Pt. Continues to work on improving UE strength, and [facility] skills in order to work towards improving, and maximizing independence with ADLs, and  IADLs.   OT Occupational Profile and History Detailed Assessment- Review of Records and additional review of physical, cognitive, psychosocial history related to current functional performance    Occupational performance deficits (Please refer to  evaluation for details): ADL's;IADL's;Leisure;Social Participation    Body Structure / Function / Physical Skills ADL;Continence;Dexterity;Flexibility;Strength;ROM;Balance;Coordination;FMC;IADL;Endurance;UE functional use;Decreased knowledge of use of DME;GMC    Psychosocial Skills Environmental  Adaptations;Habits;Routines and Behaviors    Rehab Potential Fair    Clinical Decision Making Limited treatment options, no task modification necessary    Comorbidities Affecting Occupational Performance: Presence of comorbidities impacting occupational performance    Comorbidities impacting occupational performance description: contractures of bilateral hands, dependent transfers,    Modification or Assistance to Complete Evaluation  No modification of tasks or assist necessary to complete eval    OT Frequency 2x / week    OT Duration 12 weeks    OT Treatment/Interventions Self-care/ADL training;Cryotherapy;Therapeutic exercise;DME and/or AE instruction;Balance training;Neuromuscular education;Manual Therapy;Splinting;Moist Heat;Passive range of motion;Therapeutic activities;Patient/family education    Consulted and Agree with Plan of Care Patient           Patient will benefit from skilled therapeutic intervention in order to improve the following deficits and impairments:   Body Structure / Function / Physical Skills: ADL,Continence,Dexterity,Flexibility,Strength,ROM,Balance,Coordination,FMC,IADL,Endurance,UE functional use,Decreased  knowledge of use of [facility]   Psychosocial Skills: Environmental  Adaptations,Habits,Routines and Behaviors   Visit Diagnosis: Muscle weakness (generalized)  Other lack of coordination    Problem List Patient Active Problem List   Diagnosis Date Noted  . Acute appendicitis with appendiceal abscess 05/30/2018  . Hypocalcemia 02/15/2018  . Dysuria 02/15/2018  . Bilateral lower extremity edema 02/11/2018  . Vaginal yeast infection 01/30/2018  . Chest  tightness 01/27/2018  . Healthcare-associated pneumonia 01/25/2018  . Pleural effusion, not elsewhere classified 01/25/2018  . Acute deep vein thrombosis (DVT) of left lower extremity ([place]) 01/24/2018  . Acute deep vein thrombosis (DVT) of right lower extremity ([place]) 01/24/2018  . Chronic allergic rhinitis 01/24/2018  . Depression with anxiety 01/24/2018  . UTI due to Klebsiella species 01/24/2018  . Protein-calorie malnutrition, severe ([place]) 01/24/2018  . S/P insertion of IVC (inferior vena caval) filter 01/24/2018  . Reactive depression   . Hypokalemia   . Leukocytosis   . Essential hypertension   . Trauma   . Tetraparesis ([place])   . Neuropathic pain   . Neurogenic bowel   . Neurogenic bladder   . Benign essential HTN   . Acute blood loss anemia   . Central cord syndrome at C6 level of cervical spinal cord ([place]) 11/29/2017  . Central cord syndrome [facility]) 11/29/2017    Harrel Carina, MS, OTR/L 05/07/2020, 5:05 PM  [place] MAIN [facility] SERVICES [address] [place], [place], [address] Phone: [phone]   Fax:  [phone]  Name: NAVREET BOLDA MRN: [id number] Date of Birth: May 13, 1934

## 2020-05-07 NOTE — Addendum Note (Signed)
Addended by: Lucia Bitter on: 05/07/2020 05:16 PM   Modules accepted: Orders

## 2020-05-12 ENCOUNTER — Ambulatory Visit: Payer: Medicare HMO | Admitting: Occupational Therapy

## 2020-05-12 ENCOUNTER — Other Ambulatory Visit: Payer: Self-pay

## 2020-05-12 ENCOUNTER — Ambulatory Visit: Payer: Medicare HMO

## 2020-05-12 ENCOUNTER — Encounter: Payer: Self-pay | Admitting: Occupational Therapy

## 2020-05-12 DIAGNOSIS — M6281 Muscle weakness (generalized): Secondary | ICD-10-CM | POA: Diagnosis not present

## 2020-05-12 DIAGNOSIS — R278 Other lack of coordination: Secondary | ICD-10-CM

## 2020-05-12 DIAGNOSIS — R2689 Other abnormalities of gait and mobility: Secondary | ICD-10-CM

## 2020-05-12 NOTE — Therapy (Signed)
[place] MAIN [facility] SERVICES [address] [place], [place], [address] Phone: [phone]   Fax:  [phone]  Occupational Therapy Treatment  Patient Details  Name: Sherry [name] MRN: [id number] Date of Birth: Mar 25, 1934 No data recorded  Encounter Date: 05/12/2020   OT End of Session - 05/12/20 1114    Visit Number 46    Number of Visits 38    Date for OT Re-Evaluation 07/30/20    Authorization Time Period Progress report period starting 05/07/2020    OT Start Time 1105    OT Stop Time 1145    OT Time Calculation (min) 40 min    Activity Tolerance Patient tolerated treatment well    Behavior During Therapy [facility] for tasks assessed/performed           Past Medical History:  Diagnosis Date  . Acute blood loss anemia   . Arthritis   . Cancer ([place])    skin  . Central cord syndrome at C6 level of cervical spinal cord ([place]) 11/29/2017  . Hypertension   . Protein-calorie malnutrition, severe ([place]) 01/24/2018  . S/P insertion of IVC (inferior vena caval) filter 01/24/2018  . Tetraparesis [facility])     Past Surgical History:  Procedure Laterality Date  . ANTERIOR CERVICAL DECOMP/DISCECTOMY FUSION N/A 11/29/2017   Procedure: Cervical five-six, six-seven Anterior Cervical Decompression Fusion;  Surgeon: Judith Part, MD;  Location: [place];  Service: Neurosurgery;  Laterality: N/A;  Cervical five-six, six-seven Anterior Cervical Decompression Fusion  . CATARACT EXTRACTION    . IR IVC FILTER PLMT / S&I /IMG GUID/MOD SED  12/08/2017    There were no vitals filed for this visit.   Subjective Assessment - 05/12/20 1112    Subjective  Pt. reports that her right shoulder bothered her over the weekend.    Patient is accompanied by: Family member    Pertinent History Patient s/p fall November 29, 2017 resulting in diagnosis of central cord syndrome at C 6 level.  She has had therapy in multiple venues but with recent move to [facility],  therapy staff recommended she seek outpatient therapy for her needs.    Patient Stated Goals Patient would like to be able to move better in bed, stand and perform self care tasks.    Currently in Pain? Yes    Pain Score 3     Pain Location Shoulder    Pain Orientation Right    Pain Descriptors / Indicators Sharp    Pain Type Chronic pain          OT Treatment  Pt.tolerated bilateral shoulder flexion, extension, abduction, elbow flexion, extension forearm supination AAROM/AROM. PROM bilateral wrist extension, digit MP, PIP, and DIP flexion, and extension in conjunction with moist heat to the bilateral shoulders, and hands.  Pt. reports right shoulder pain over the weekend that she attributes to her having had to reach for puzzle pieces while completing a puzzle this past weekend.Pt.continues to engageher right hand whenperforming self-feeding with adpativeequipment.Pt. reports that she is able to use her right thumb to hold items. Pt. tolerated ROM well without reports of pain, or discomfort. Pt.continues to benefit from working on improving ROM in order to work towards increasing engagement of her bilateral hands during ADLs, and IADL tasks.                         OT Education - 05/12/20 1114    Education Details BUE ROM with  functional reaching    Person(s) Educated Patient;Caregiver(s)    Methods Explanation;Demonstration    Comprehension Verbalized understanding;Need further instruction               OT Long Term Goals - 05/07/20 1137      OT LONG TERM GOAL #1   Title Patient and caregiver will demonstrate understanding of home exercise program for ROM.    Baseline Pt. continues to have a restorative aide rehab aide assist her wih exercises at [facility]    Time 12    Period Weeks    Status On-going    Target Date 07/30/20      OT LONG TERM GOAL #2   Title Patient will demonstrate ability to don shirt with min assist from seated  position.    Baseline Pt. is indepedent with a larger shirt, and continues to require increased time to complete,    Time 12    Period Weeks    Status Achieved      OT LONG TERM GOAL #4   Baseline Pt. continues to present with improving finger flexion of R hand for a partial gross grasp, but unable to grasp and hold items. Very limited digit flexion of L hand. Pt. has active left thumb abduction.    Time 12    Period Weeks    Status On-going    Target Date 07/30/20      OT LONG TERM GOAL #5   Title Pt will complete self feeding with setup, use of universal cuff and minimal spillage.    Baseline inconsistent with feeding and increased spillage. Pt is able to use a fork without spillage. Pt. has spillage with a spoon.    Time 12    Period Weeks    Status Achieved      OT LONG TERM GOAL #6   Title Patient will increase right UE ROM to comb the back of her hair with modified independence.    Baseline Pt. continues to be able to comb the front, and sides of her head. Pt. can reach the back, however cannot see what she is doing in the back of her head.    Time 12    Period Weeks    Status Partially Met    Target Date 07/30/20      OT LONG TERM GOAL #8   Title Pt. will write, and sign her name with 100% legibility, and modified independence.    Baseline Pt. continues to  consistently filling out her daily menu, and writing for crossword puzzles. Pt. is able to maintain grasp on a wide width pen. [place] to work on Media pl[employer].    Time 12    Period Weeks    Status On-going    Target Date 07/30/20                 Plan - 05/12/20 1115    Clinical Impression Statement Pt. reports right shoulder pain over the weekend that she attributes to her having had to reach for puzzle pieces while completing a puzzle this past weekend.Pt.continues to engageher right hand whenperforming self-feeding with adpativeequipment.Pt. reports that she is able to use her right thumb to  hold items. Pt. tolerated ROM well without reports of pain, or discomfort. Pt.continues to benefit from working on improving ROM in order to work towards increasing engagement of her bilateral hands during ADLs, and IADL tasks.   OT Occupational Profile and History Detailed Assessment- Review of Records and additional review  of physical, cognitive, psychosocial history related to current functional performance    Occupational performance deficits (Please refer to evaluation for details): ADL's;IADL's;Leisure;Social Participation    Body Structure / Function / Physical Skills ADL;Continence;Dexterity;Flexibility;Strength;ROM;Balance;Coordination;FMC;IADL;Endurance;UE functional use;Decreased knowledge of use of DME;GMC    Rehab Potential Fair    Clinical Decision Making Limited treatment options, no task modification necessary    Comorbidities Affecting Occupational Performance: Presence of comorbidities impacting occupational performance    Comorbidities impacting occupational performance description: contractures of bilateral hands, dependent transfers,    Modification or Assistance to Complete Evaluation  No modification of tasks or assist necessary to complete eval    OT Frequency 2x / week    OT Duration 12 weeks    OT Treatment/Interventions Self-care/ADL training;Cryotherapy;Therapeutic exercise;DME and/or AE instruction;Balance training;Neuromuscular education;Manual Therapy;Splinting;Moist Heat;Passive range of motion;Therapeutic activities;Patient/family education    Consulted and Agree with Plan of Care Patient           Patient will benefit from skilled therapeutic intervention in order to improve the following deficits and impairments:   Body Structure / Function / Physical Skills: ADL,Continence,Dexterity,Flexibility,Strength,ROM,Balance,Coordination,FMC,IADL,Endurance,UE functional use,Decreased knowledge of use of [facility]       Visit Diagnosis: Muscle weakness  (generalized)    Problem List Patient Active Problem List   Diagnosis Date Noted  . Acute appendicitis with appendiceal abscess 05/30/2018  . Hypocalcemia 02/15/2018  . Dysuria 02/15/2018  . Bilateral lower extremity edema 02/11/2018  . Vaginal yeast infection 01/30/2018  . Chest tightness 01/27/2018  . Healthcare-associated pneumonia 01/25/2018  . Pleural effusion, not elsewhere classified 01/25/2018  . Acute deep vein thrombosis (DVT) of left lower extremity ([place]) 01/24/2018  . Acute deep vein thrombosis (DVT) of right lower extremity ([place]) 01/24/2018  . Chronic allergic rhinitis 01/24/2018  . Depression with anxiety 01/24/2018  . UTI due to Klebsiella species 01/24/2018  . Protein-calorie malnutrition, severe ([place]) 01/24/2018  . S/P insertion of IVC (inferior vena caval) filter 01/24/2018  . Reactive depression   . Hypokalemia   . Leukocytosis   . Essential hypertension   . Trauma   . Tetraparesis ([place])   . Neuropathic pain   . Neurogenic bowel   . Neurogenic bladder   . Benign essential HTN   . Acute blood loss anemia   . Central cord syndrome at C6 level of cervical spinal cord ([place]) 11/29/2017  . Central cord syndrome [facility]) 11/29/2017    Harrel Carina, MS, OTR/L 05/12/2020, 11:54 AM  [place] MAIN [facility] SERVICES [address] [place], [place], [address] Phone: [phone]   Fax:  [phone]  Name: Sherry [name] MRN: [id number] Date of Birth: [birth year]

## 2020-05-12 NOTE — Therapy (Signed)
[place] MAIN [facility] SERVICES [address] [place], [place], [address] Phone: [phone]   Fax:  [phone]  Physical Therapy Treatment  Patient Details  Name: Sherry [name] MRN: [id number] Date of Birth: 1935/01/04 Referring Provider (PT): Viviana Simpler   Encounter Date: 05/12/2020   PT End of Session - 05/12/20 1152    Visit Number 55    Number of Visits 74    Date for PT Re-Evaluation 06/30/20    PT Start Time 0086    PT Stop Time 1220    PT Time Calculation (min) 35 min    Equipment Utilized During Treatment Gait belt    Activity Tolerance Patient tolerated treatment well;Patient limited by fatigue    Behavior During Therapy [facility] for tasks assessed/performed           Past Medical History:  Diagnosis Date  . Acute blood loss anemia   . Arthritis   . Cancer ([place])    skin  . Central cord syndrome at C6 level of cervical spinal cord ([place]) 11/29/2017  . Hypertension   . Protein-calorie malnutrition, severe ([place]) 01/24/2018  . S/P insertion of IVC (inferior vena caval) filter 01/24/2018  . Tetraparesis [facility])     Past Surgical History:  Procedure Laterality Date  . ANTERIOR CERVICAL DECOMP/DISCECTOMY FUSION N/A 11/29/2017   Procedure: Cervical five-six, six-seven Anterior Cervical Decompression Fusion;  Surgeon: Judith Part, MD;  Location: [place];  Service: Neurosurgery;  Laterality: N/A;  Cervical five-six, six-seven Anterior Cervical Decompression Fusion  . CATARACT EXTRACTION    . IR IVC FILTER PLMT / S&I /IMG GUID/MOD SED  12/08/2017    There were no vitals filed for this visit.   Subjective Assessment - 05/12/20 1152    Subjective Patient reports feeling okay today - states no new complaints.    Patient is accompained by: --   [name] (aide)   Pertinent History Pt is an 86 yo female that fell in 2019, fractured vertebrae in her neck and in her low back per family. Per chart, pt experienced incomplete  quadiparesis at level C6. After hospital stay, pt discharged to [facility] for ~1 month, experienced severe UTI as well as bilateral DVT (IVC filter placed). Discharged to [facility], stayed for about a year, and then moved to [facility] in April 2021. Was receiving PT, but per family facility reported that did not have adequate equipment to maximize PT for her. Pt until about 1 month ago was practicing sit to stand transfers with 1-2 people, and working on static standing. Has a brace for L foot due to PF. Pt currently needs assistance with all ADLs (able to assist with donning/doffing her shirt), bed baths, and needs a hoyer lift for transfers. Able to drive power wheelchair without assistance.    Limitations Standing;Walking;House hold activities;Lifting    How long can you sit comfortably? NA    How long can you stand comfortably? unable    How long can you walk comfortably? unable    Patient Stated Goals "to stand up and walk"    Currently in Pain? No/denies    Pain Onset 1 to 4 weeks ago             Interventions:    Standing trials:  1) 36min 07  sec - Mod A - Patient was standing well- yet knee buckled and patient unable to recover. .  2) 1 min 01 sec- stopped due to overall fatigue rather than knee buckling. 3)  40 sec- Difficulty achieving erect posture and unable  4)49 sec - improved ability to achieve center of gravity but difficulty maintaining today.  5) One more stand to reposition better back into power chair- max assist to stand  Patient requires verbal cues for all standing including maintaining posture and for scooting forward.       Clinical Impression: Treatment remains limited due to undo fatigue with standing today requiring rest breaks. Patient experiencing increased knee buckling vs previous visits. Will continue standing activity to try to prepare for eventual stand pivot transfer.  She will  benefit from continued PT services to increase mobility and strength to  improve patient's quality of life.                                           PT Education - 05/13/20 0844    Education Details Importance of changing position to reduce risk of pressure sore.    Person(s) Educated Patient    Methods Explanation;Verbal cues    Comprehension Verbalized understanding;Verbal cues required            PT Short Term Goals - 03/10/20 2211      PT SHORT TERM GOAL #1   Title The patient will perform initial HEP with minimum assistance in order to improve strength and function.    Baseline Patient demonstrating independence with initial HEP as of 03/10/2020 with no questions or difficulty.    Time 4    Period Weeks    Status Achieved    Target Date 02/11/20             PT Long Term Goals - 04/21/20 1301      PT LONG TERM GOAL #1   Title The patient will be compliant with finalized HEP with minimum assistance in preparation for self management and maintenance of condition.    Baseline 03/10/2020- Patient familiar with initial HEP and will keep goal active as HEP becomes more progressive including possible transfers and standing activities. 04/07/2020- Patient continues to report compliance with LE strengthening home exercises without questions or concerns. 04/21/2020- Patient able to verbalize and demonstrate good understanding of current HEP including some supine and seated LE strengthening exercises. Reviewed and patient performed 10 reps today with minimal cueing. Will keep goal active to progress HEP as appropriate.    Time 12    Period Weeks    Status On-going    Target Date 06/30/20      PT LONG TERM GOAL #2   Title The patient will demonstrate at least 10 point improvement on FOTO score indicating an improved ability to perform functional activities.    Baseline on eval (/720) score 12; 10/22/19: 12; 12/10/19: 18, 12/13: 18: 04/07/2020- Will assess next visit.    Time 12    Period Weeks    Status On-going     Target Date 06/30/20      PT LONG TERM GOAL #3   Title The patient will demonstrate lateral scooting  for assistance with transfers with minA to maximize independence.    Baseline 10/22/19: Pt requires modA+1 for lateral scooting; 12/10/19: modA+1, 12/13: modA-maxA, 12/27: modA-maxA, 03/10/2020- ModA-MaxA- increased verbal cues and visual demonstration. 04/07/2020- Continued Max assist to perform forward/lateral scoot. 04/21/2020- Patient was able to demo slight lateral scoot with CGA approx 12 in then fatigued requiring max assist- she is able to scoot forward  by leaning without significant issues.    Time 12    Period Weeks    Status On-going    Target Date 06/30/20      PT LONG TERM GOAL #4   Title The patient will demonstrate a squat pivot transfer with minimum assistance to maximize independence and mobility.    Baseline 10/22/19: unable to safely attempt at this time; 12/10/19: unable to safely attempt at this time, 01/14/20: unable to safety attempt at this time. 03/10/2020- Patient able to perform Stand pivot transfer with Maximal assist. 04/07/2020- Patient continues to require Max assist with sit to stand- will assist SPT next visit. 04/21/2020- Patient presents with Max assist to perform Sit to stand Transfer- unable to pivot or move Left LE well without difficulty.    Time 12    Period Weeks    Status On-going    Target Date 06/30/20      PT LONG TERM GOAL #5   Title The patient will demonstrate sitting without UE support for 2-5 minutes at EOB to improve participation and maximize independence with ADLs.    Baseline 10/22/19: Pt able ot sit unsupported for at least 2 minutes at edge of bed. 04/21/2020- Patient continues to demo good sitting at edge of mat > 5 miin without difficulty or back support.    Time 12    Period Weeks    Status Achieved                 Plan - 05/12/20 1153    Clinical Impression Statement Treatment remains limited due to undo fatigue with standing today  requiring rest breaks. Patient experiencing increased knee buckling vs previous visits. Will continue standing activity to try to prepare for eventual stand pivot transfer.  She will  benefit from continued PT services to increase mobility and strength to improve patient's quality of life.    Personal Factors and Comorbidities Age;Time since onset of injury/illness/exacerbation;Comorbidity 3+;Fitness    Comorbidities HTN, quadriparesis, history of DVT, neurogenic bladder    Examination-Activity Limitations Bathing;Hygiene/Grooming;Squat;Bed Mobility;Lift;Bend;Stand;Engineer, manufacturing;Toileting;Self Feeding;Transfers;Continence;Sit;Dressing;Sleep;Carry    Examination-Participation Restrictions Church;Laundry;Cleaning;Medication Management;Community Activity;Meal Prep;Interpersonal Relationship    Stability/Clinical Decision Making Evolving/Moderate complexity    Rehab Potential Fair    PT Frequency 2x / week    PT Duration 12 weeks    PT Treatment/Interventions ADLs/Self Care Home Management;Electrical Stimulation;Therapeutic activities;Wheelchair mobility training;Vasopneumatic Device;Joint Manipulations;Vestibular;Passive range of motion;Patient/family education;Therapeutic exercise;DME Instruction;Biofeedback;Aquatic Therapy;Moist Heat;Gait training;Balance training;Orthotic Fit/Training;Dry needling;Energy conservation;Taping;Splinting;Neuromuscular re-education;Cryotherapy;Ultrasound;Functional mobility training    PT Next Visit Plan Continue with progressive Standing/transfer training.    PT Home Exercise Plan no changes    Consulted and Agree with Plan of Care Patient           Patient will benefit from skilled therapeutic intervention in order to improve the following deficits and impairments:  Abnormal gait,Decreased balance,Decreased endurance,Decreased mobility,Difficulty walking,Hypomobility,Impaired sensation,Decreased range of motion,Improper body mechanics,Impaired  perceived functional ability,Decreased activity tolerance,Decreased knowledge of use of DME,Decreased safety awareness,Decreased strength,Impaired flexibility,Impaired UE functional use,Postural dysfunction  Visit Diagnosis: Muscle weakness (generalized)  Other lack of coordination  Other abnormalities of gait and mobility     Problem List Patient Active Problem List   Diagnosis Date Noted  . Acute appendicitis with appendiceal abscess 05/30/2018  . Hypocalcemia 02/15/2018  . Dysuria 02/15/2018  . Bilateral lower extremity edema 02/11/2018  . Vaginal yeast infection 01/30/2018  . Chest tightness 01/27/2018  . Healthcare-associated pneumonia 01/25/2018  . Pleural effusion, not elsewhere classified 01/25/2018  . Acute deep vein  thrombosis (DVT) of left lower extremity ([place]) 01/24/2018  . Acute deep vein thrombosis (DVT) of right lower extremity ([place]) 01/24/2018  . Chronic allergic rhinitis 01/24/2018  . Depression with anxiety 01/24/2018  . UTI due to Klebsiella species 01/24/2018  . Protein-calorie malnutrition, severe ([place]) 01/24/2018  . S/P insertion of IVC (inferior vena caval) filter 01/24/2018  . Reactive depression   . Hypokalemia   . Leukocytosis   . Essential hypertension   . Trauma   . Tetraparesis ([place])   . Neuropathic pain   . Neurogenic bowel   . Neurogenic bladder   . Benign essential HTN   . Acute blood loss anemia   . Central cord syndrome at C6 level of cervical spinal cord ([place]) 11/29/2017  . Central cord syndrome [facility]) 11/29/2017    Lewis Moccasin, PT 05/13/2020, 8:51 AM  [place] MAIN [facility] SERVICES [address] [place], [place], [address] Phone: [phone]   Fax:  [phone]  Name: Sherry [name] MRN: [id number] Date of Birth: July 13, 1934

## 2020-05-14 ENCOUNTER — Ambulatory Visit: Payer: Medicare HMO | Admitting: Occupational Therapy

## 2020-05-14 ENCOUNTER — Ambulatory Visit: Payer: Medicare HMO

## 2020-05-14 ENCOUNTER — Other Ambulatory Visit: Payer: Self-pay

## 2020-05-14 DIAGNOSIS — M6281 Muscle weakness (generalized): Secondary | ICD-10-CM | POA: Diagnosis not present

## 2020-05-14 DIAGNOSIS — R269 Unspecified abnormalities of gait and mobility: Secondary | ICD-10-CM

## 2020-05-14 DIAGNOSIS — R262 Difficulty in walking, not elsewhere classified: Secondary | ICD-10-CM

## 2020-05-14 NOTE — Therapy (Signed)
[place] MAIN [facility] SERVICES [address] [place], [place], [address] Phone: [phone]   Fax:  [phone]  Occupational Therapy Treatment  Patient Details  Name: Sherry [name] MRN: [id number] Date of Birth: 1934/11/25 No data recorded  Encounter Date: 05/14/2020   OT End of Session - 05/14/20 1459    Visit Number 85    Number of Visits 62    Date for OT Re-Evaluation 07/30/20    Authorization Time Period Progress report period starting 05/07/2020    OT Start Time 1104    OT Stop Time 1145    OT Time Calculation (min) 41 min    Equipment Utilized During Treatment moist heat    Activity Tolerance Patient tolerated treatment well    Behavior During Therapy [facility] for tasks assessed/performed           Past Medical History:  Diagnosis Date  . Acute blood loss anemia   . Arthritis   . Cancer ([place])    skin  . Central cord syndrome at C6 level of cervical spinal cord ([place]) 11/29/2017  . Hypertension   . Protein-calorie malnutrition, severe ([place]) 01/24/2018  . S/P insertion of IVC (inferior vena caval) filter 01/24/2018  . Tetraparesis [facility])     Past Surgical History:  Procedure Laterality Date  . ANTERIOR CERVICAL DECOMP/DISCECTOMY FUSION N/A 11/29/2017   Procedure: Cervical five-six, six-seven Anterior Cervical Decompression Fusion;  Surgeon: Judith Part, MD;  Location: [place];  Service: Neurosurgery;  Laterality: N/A;  Cervical five-six, six-seven Anterior Cervical Decompression Fusion  . CATARACT EXTRACTION    . IR IVC FILTER PLMT / S&I /IMG GUID/MOD SED  12/08/2017    There were no vitals filed for this visit.   Subjective Assessment - 05/14/20 1119    Subjective  Pt. reports that  she is doing well today.    Patient is accompanied by: Family member    Pertinent History Patient s/p fall November 29, 2017 resulting in diagnosis of central cord syndrome at C 6 level.  She has had therapy in multiple venues but with  recent move to [facility], therapy staff recommended she seek outpatient therapy for her needs.    Currently in Pain? No/denies           OT Treatment  Pt.tolerated bilateral shoulder flexion, extension, abduction, elbow flexion, extension forearm supination AAROM/AROM. PROM bilateral wrist extension, digit MP, PIP, and DIP flexion, and extension in conjunction with moist heat to the bilateral shoulders, and hands.  Pt. reports right shoulder pain has improved.Pt.continues to engageher right hand during ADL, and IADL tasks at home.Pt. reports that she is able to use her right thumb to hold items. Pt. tolerated ROM well without reports of pain, or discomfort. Pt.continues to benefit from working on improving ROM in order to work towards increasing engagement of her bilateral hands during ADLs, and IADL tasks.                        OT Education - 05/14/20 1458    Education Details BUE ROM with functional reaching    Person(s) Educated Patient;Caregiver(s)    Methods Explanation;Demonstration    Comprehension Verbalized understanding;Need further instruction               OT Long Term Goals - 05/07/20 1137      OT LONG TERM GOAL #1   Title Patient and caregiver will demonstrate understanding of home exercise program for ROM.  Baseline Pt. continues to have a restorative aide rehab aide assist her wih exercises at [facility]    Time 12    Period Weeks    Status On-going    Target Date 07/30/20      OT LONG TERM GOAL #2   Title Patient will demonstrate ability to don shirt with min assist from seated position.    Baseline Pt. is indepedent with a larger shirt, and continues to require increased time to complete,    Time 12    Period Weeks    Status Achieved      OT LONG TERM GOAL #4   Baseline Pt. continues to present with improving finger flexion of R hand for a partial gross grasp, but unable to grasp and hold items. Very limited digit  flexion of L hand. Pt. has active left thumb abduction.    Time 12    Period Weeks    Status On-going    Target Date 07/30/20      OT LONG TERM GOAL #5   Title Pt will complete self feeding with setup, use of universal cuff and minimal spillage.    Baseline inconsistent with feeding and increased spillage. Pt is able to use a fork without spillage. Pt. has spillage with a spoon.    Time 12    Period Weeks    Status Achieved      OT LONG TERM GOAL #6   Title Patient will increase right UE ROM to comb the back of her hair with modified independence.    Baseline Pt. continues to be able to comb the front, and sides of her head. Pt. can reach the back, however cannot see what she is doing in the back of her head.    Time 12    Period Weeks    Status Partially Met    Target Date 07/30/20      OT LONG TERM GOAL #8   Title Pt. will write, and sign her name with 100% legibility, and modified independence.    Baseline Pt. continues to  consistently filling out her daily menu, and writing for crossword puzzles. Pt. is able to maintain grasp on a wide width pen. [place] to work on Media pl[employer].    Time 12    Period Weeks    Status On-going    Target Date 07/30/20                 Plan - 05/14/20 1500    Clinical Impression Statement Pt. reports right shoulder pain has improved.Pt.continues to engageher right hand during ADL, and IADL tasks at home.Pt. reports that she is able to use her right thumb to hold items. Pt. tolerated ROM well without reports of pain, or discomfort. Pt.continues to benefit from working on improving ROM in order to work towards increasing engagement of her bilateral hands during ADLs, and IADL tasks.   OT Occupational Profile and History Detailed Assessment- Review of Records and additional review of physical, cognitive, psychosocial history related to current functional performance    Occupational performance deficits (Please refer to evaluation  for details): ADL's;IADL's;Leisure;Social Participation    Body Structure / Function / Physical Skills ADL;Continence;Dexterity;Flexibility;Strength;ROM;Balance;Coordination;FMC;IADL;Endurance;UE functional use;Decreased knowledge of use of DME;GMC    Psychosocial Skills Environmental  Adaptations;Habits;Routines and Behaviors    Rehab Potential Fair    Clinical Decision Making Limited treatment options, no task modification necessary    Comorbidities Affecting Occupational Performance: Presence of comorbidities impacting occupational performance  Modification or Assistance to Complete Evaluation  No modification of tasks or assist necessary to complete eval    OT Frequency 2x / week    OT Duration 12 weeks    OT Treatment/Interventions Self-care/ADL training;Cryotherapy;Therapeutic exercise;DME and/or AE instruction;Balance training;Neuromuscular education;Manual Therapy;Splinting;Moist Heat;Passive range of motion;Therapeutic activities;Patient/family education    Consulted and Agree with Plan of Care Patient           Patient will benefit from skilled therapeutic intervention in order to improve the following deficits and impairments:   Body Structure / Function / Physical Skills: ADL,Continence,Dexterity,Flexibility,Strength,ROM,Balance,Coordination,FMC,IADL,Endurance,UE functional use,Decreased knowledge of use of [facility]   Psychosocial Skills: Environmental  Adaptations,Habits,Routines and Behaviors   Visit Diagnosis: Muscle weakness (generalized)    Problem List Patient Active Problem List   Diagnosis Date Noted  . Acute appendicitis with appendiceal abscess 05/30/2018  . Hypocalcemia 02/15/2018  . Dysuria 02/15/2018  . Bilateral lower extremity edema 02/11/2018  . Vaginal yeast infection 01/30/2018  . Chest tightness 01/27/2018  . Healthcare-associated pneumonia 01/25/2018  . Pleural effusion, not elsewhere classified 01/25/2018  . Acute deep vein thrombosis (DVT) of  left lower extremity ([place]) 01/24/2018  . Acute deep vein thrombosis (DVT) of right lower extremity ([place]) 01/24/2018  . Chronic allergic rhinitis 01/24/2018  . Depression with anxiety 01/24/2018  . UTI due to Klebsiella species 01/24/2018  . Protein-calorie malnutrition, severe ([place]) 01/24/2018  . S/P insertion of IVC (inferior vena caval) filter 01/24/2018  . Reactive depression   . Hypokalemia   . Leukocytosis   . Essential hypertension   . Trauma   . Tetraparesis ([place])   . Neuropathic pain   . Neurogenic bowel   . Neurogenic bladder   . Benign essential HTN   . Acute blood loss anemia   . Central cord syndrome at C6 level of cervical spinal cord ([place]) 11/29/2017  . Central cord syndrome [facility]) 11/29/2017    Harrel Carina, MS, OTR/L 05/14/2020, 3:06 PM  [place] MAIN [facility] SERVICES [address] [place], [place], [address] Phone: [phone]   Fax:  [phone]  Name: HADYN AZER MRN: [id number] Date of Birth: 01/10/1935

## 2020-05-14 NOTE — Therapy (Signed)
[place] MAIN [facility] SERVICES [address] [place], [place], [address] Phone: [phone]   Fax:  [phone]  Physical Therapy Treatment  Patient Details  Name: Sherry [name] MRN: [id number] Date of Birth: [birth year] Referring Provider (PT): Viviana Simpler   Encounter Date: 05/14/2020   PT End of Session - 05/14/20 1154    Visit Number 56    Number of Visits 64    Date for PT Re-Evaluation 06/30/20    PT Start Time 1148    PT Stop Time 1220    PT Time Calculation (min) 32 min    Equipment Utilized During Treatment Gait belt    Activity Tolerance Patient tolerated treatment well;Patient limited by fatigue    Behavior During Therapy WFL for tasks assessed/performed           Past Medical History:  Diagnosis Date  . Acute blood loss anemia   . Arthritis   . Cancer ([place])    skin  . Central cord syndrome at C6 level of cervical spinal cord ([place]) 11/29/2017  . Hypertension   . Protein-calorie malnutrition, severe ([place]) 01/24/2018  . S/P insertion of IVC (inferior vena caval) filter 01/24/2018  . Tetraparesis [facility])     Past Surgical History:  Procedure Laterality Date  . ANTERIOR CERVICAL DECOMP/DISCECTOMY FUSION N/A 11/29/2017   Procedure: Cervical five-six, six-seven Anterior Cervical Decompression Fusion;  Surgeon: Judith Part, MD;  Location: [place];  Service: Neurosurgery;  Laterality: N/A;  Cervical five-six, six-seven Anterior Cervical Decompression Fusion  . CATARACT EXTRACTION    . IR IVC FILTER PLMT / S&I /IMG GUID/MOD SED  12/08/2017    There were no vitals filed for this visit.   Subjective Assessment - 05/14/20 1153    Subjective Patient reports she is okay today- agreeabel to try to stand again. "Lets see what I can do."    Patient is accompained by: --   Sherry [name] (aide)   Pertinent History Pt is an 86 yo female that fell in 2019, fractured vertebrae in her neck and in her low back per family. Per chart, pt  experienced incomplete quadiparesis at level C6. After hospital stay, pt discharged to [facility] for ~1 month, experienced severe UTI as well as bilateral DVT (IVC filter placed). Discharged to [facility], stayed for about a year, and then moved to [facility] in April 2021. Was receiving PT, but per family facility reported that did not have adequate equipment to maximize PT for her. Pt until about 1 month ago was practicing sit to stand transfers with 1-2 people, and working on static standing. Has a brace for L foot due to PF. Pt currently needs assistance with all ADLs (able to assist with donning/doffing her shirt), bed baths, and needs a hoyer lift for transfers. Able to drive power wheelchair without assistance.    Limitations Standing;Walking;House hold activities;Lifting    How long can you sit comfortably? NA    How long can you stand comfortably? unable    How long can you walk comfortably? unable    Patient Stated Goals "to stand up and walk"    Currently in Pain? No/denies    Pain Onset 1 to 4 weeks ago             Interventions:    Standing trials:  1) 3 min 20 sec - Mod A - Patient able to maintain COG better and exhibited good ability to ext hips better today.  2) 1 min 52  sec- stopped due to Patient fatigue and requiring more physical assist after 1 min 30 sec. 3)  1 min 58  sec- Much improved ability to maintain standing with min- mod assist- more mod after 1 min yet no knee buckling.   Patient requires verbal cues for all standing including maintaining posture, Knee and hip ext.   Clinical Impression: Patient able to demonstrate improved overall individual standing times with more consistency. Patient again experienced her personal best time with standing and at times only required min A using gait belt. She will benefit from continued PT services to increase mobility and strength to improve patient's quality of life.                         PT Education -  05/14/20 1259    Education Details Postural cues for best performance with standing.    Person(s) Educated Patient    Methods Explanation;Demonstration;Tactile cues;Verbal cues    Comprehension Verbalized understanding;Tactile cues required;Returned demonstration;Need further instruction;Verbal cues required            PT Short Term Goals - 03/10/20 2211      PT SHORT TERM GOAL #1   Title The patient will perform initial HEP with minimum assistance in order to improve strength and function.    Baseline Patient demonstrating independence with initial HEP as of 03/10/2020 with no questions or difficulty.    Time 4    Period Weeks    Status Achieved    Target Date 02/11/20             PT Long Term Goals - 04/21/20 1301      PT LONG TERM GOAL #1   Title The patient will be compliant with finalized HEP with minimum assistance in preparation for self management and maintenance of condition.    Baseline 03/10/2020- Patient familiar with initial HEP and will keep goal active as HEP becomes more progressive including possible transfers and standing activities. 04/07/2020- Patient continues to report compliance with LE strengthening home exercises without questions or concerns. 04/21/2020- Patient able to verbalize and demonstrate good understanding of current HEP including some supine and seated LE strengthening exercises. Reviewed and patient performed 10 reps today with minimal cueing. Will keep goal active to progress HEP as appropriate.    Time 12    Period Weeks    Status On-going    Target Date 06/30/20      PT LONG TERM GOAL #2   Title The patient will demonstrate at least 10 point improvement on FOTO score indicating an improved ability to perform functional activities.    Baseline on eval (/720) score 12; 10/22/19: 12; 12/10/19: 18, 12/13: 18: 04/07/2020- Will assess next visit.    Time 12    Period Weeks    Status On-going    Target Date 06/30/20      PT LONG TERM GOAL #3    Title The patient will demonstrate lateral scooting  for assistance with transfers with minA to maximize independence.    Baseline 10/22/19: Pt requires modA+1 for lateral scooting; 12/10/19: modA+1, 12/13: modA-maxA, 12/27: modA-maxA, 03/10/2020- ModA-MaxA- increased verbal cues and visual demonstration. 04/07/2020- Continued Max assist to perform forward/lateral scoot. 04/21/2020- Patient was able to demo slight lateral scoot with CGA approx 12 in then fatigued requiring max assist- she is able to scoot forward by leaning without significant issues.    Time 12    Period Weeks    Status On-going  Target Date 06/30/20      PT LONG TERM GOAL #4   Title The patient will demonstrate a squat pivot transfer with minimum assistance to maximize independence and mobility.    Baseline 10/22/19: unable to safely attempt at this time; 12/10/19: unable to safely attempt at this time, 01/14/20: unable to safety attempt at this time. 03/10/2020- Patient able to perform Stand pivot transfer with Maximal assist. 04/07/2020- Patient continues to require Max assist with sit to stand- will assist SPT next visit. 04/21/2020- Patient presents with Max assist to perform Sit to stand Transfer- unable to pivot or move Left LE well without difficulty.    Time 12    Period Weeks    Status On-going    Target Date 06/30/20      PT LONG TERM GOAL #5   Title The patient will demonstrate sitting without UE support for 2-5 minutes at EOB to improve participation and maximize independence with ADLs.    Baseline 10/22/19: Pt able ot sit unsupported for at least 2 minutes at edge of bed. 04/21/2020- Patient continues to demo good sitting at edge of mat > 5 miin without difficulty or back support.    Time 12    Period Weeks    Status Achieved                 Plan - 05/14/20 1155    Clinical Impression Statement Patient able to demonstrate improved overall individual standing times with more consistency. Patient again experienced  her personal best time with standing and at times only required min A using gait belt. She will benefit from continued PT services to increase mobility and strength to improve patient's quality of life.    Personal Factors and Comorbidities Age;Time since onset of injury/illness/exacerbation;Comorbidity 3+;Fitness    Comorbidities HTN, quadriparesis, history of DVT, neurogenic bladder    Examination-Activity Limitations Bathing;Hygiene/Grooming;Squat;Bed Mobility;Lift;Bend;Stand;Engineer, manufacturing;Toileting;Self Feeding;Transfers;Continence;Sit;Dressing;Sleep;Carry    Examination-Participation Restrictions Church;Laundry;Cleaning;Medication Management;Community Activity;Meal Prep;Interpersonal Relationship    Stability/Clinical Decision Making Evolving/Moderate complexity    Rehab Potential Fair    PT Frequency 2x / week    PT Duration 12 weeks    PT Treatment/Interventions ADLs/Self Care Home Management;Electrical Stimulation;Therapeutic activities;Wheelchair mobility training;Vasopneumatic Device;Joint Manipulations;Vestibular;Passive range of motion;Patient/family education;Therapeutic exercise;DME Instruction;Biofeedback;Aquatic Therapy;Moist Heat;Gait training;Balance training;Orthotic Fit/Training;Dry needling;Energy conservation;Taping;Splinting;Neuromuscular re-education;Cryotherapy;Ultrasound;Functional mobility training    PT Next Visit Plan Continue with progressive Standing/transfer training.    PT Home Exercise Plan no changes    Consulted and Agree with Plan of Care Patient           Patient will benefit from skilled therapeutic intervention in order to improve the following deficits and impairments:  Abnormal gait,Decreased balance,Decreased endurance,Decreased mobility,Difficulty walking,Hypomobility,Impaired sensation,Decreased range of motion,Improper body mechanics,Impaired perceived functional ability,Decreased activity tolerance,Decreased knowledge of use of  DME,Decreased safety awareness,Decreased strength,Impaired flexibility,Impaired UE functional use,Postural dysfunction  Visit Diagnosis: Abnormality of gait and mobility  Difficulty in walking, not elsewhere classified  Muscle weakness (generalized)     Problem List Patient Active Problem List   Diagnosis Date Noted  . Acute appendicitis with appendiceal abscess 05/30/2018  . Hypocalcemia 02/15/2018  . Dysuria 02/15/2018  . Bilateral lower extremity edema 02/11/2018  . Vaginal yeast infection 01/30/2018  . Chest tightness 01/27/2018  . Healthcare-associated pneumonia 01/25/2018  . Pleural effusion, not elsewhere classified 01/25/2018  . Acute deep vein thrombosis (DVT) of left lower extremity ([place]) 01/24/2018  . Acute deep vein thrombosis (DVT) of right lower extremity ([place]) 01/24/2018  . Chronic allergic rhinitis 01/24/2018  .  Depression with anxiety 01/24/2018  . UTI due to Klebsiella species 01/24/2018  . Protein-calorie malnutrition, severe ([place]) 01/24/2018  . S/P insertion of IVC (inferior vena caval) filter 01/24/2018  . Reactive depression   . Hypokalemia   . Leukocytosis   . Essential hypertension   . Trauma   . Tetraparesis ([place])   . Neuropathic pain   . Neurogenic bowel   . Neurogenic bladder   . Benign essential HTN   . Acute blood loss anemia   . Central cord syndrome at C6 level of cervical spinal cord ([place]) 11/29/2017  . Central cord syndrome [facility]) 11/29/2017    Lewis Moccasin, PT 05/14/2020, 1:00 PM  [place] MAIN [facility] SERVICES [address] [place], [place], [address] Phone: [phone]   Fax:  [phone]  Name: PRABHLEEN MONTEMAYOR MRN: [id number] Date of Birth: 1934-03-26

## 2020-05-19 ENCOUNTER — Ambulatory Visit: Payer: Medicare HMO | Admitting: Occupational Therapy

## 2020-05-19 ENCOUNTER — Encounter: Payer: Self-pay | Admitting: Occupational Therapy

## 2020-05-19 ENCOUNTER — Ambulatory Visit: Payer: Medicare HMO

## 2020-05-19 ENCOUNTER — Other Ambulatory Visit: Payer: Self-pay

## 2020-05-19 DIAGNOSIS — R269 Unspecified abnormalities of gait and mobility: Secondary | ICD-10-CM

## 2020-05-19 DIAGNOSIS — R262 Difficulty in walking, not elsewhere classified: Secondary | ICD-10-CM

## 2020-05-19 DIAGNOSIS — M6281 Muscle weakness (generalized): Secondary | ICD-10-CM

## 2020-05-19 DIAGNOSIS — R278 Other lack of coordination: Secondary | ICD-10-CM

## 2020-05-19 DIAGNOSIS — R2689 Other abnormalities of gait and mobility: Secondary | ICD-10-CM

## 2020-05-19 NOTE — Therapy (Signed)
[place] MAIN [facility] SERVICES [address] [place], [place], [address] Phone: [phone]   Fax:  [phone]  Occupational Therapy Treatment  Patient Details  Name: Sherry [name] MRN: [id number] Date of Birth: Apr 23, 1934 No data recorded  Encounter Date: 05/19/2020   OT End of Session - 05/19/20 1755    Visit Number 32    Number of Visits 57    Date for OT Re-Evaluation 07/30/20    Authorization Time Period Progress report period starting 05/07/2020    OT Start Time 1108    OT Stop Time 1145    OT Time Calculation (min) 37 min    Activity Tolerance Patient tolerated treatment well    Behavior During Therapy [facility] for tasks assessed/performed           Past Medical History:  Diagnosis Date  . Acute blood loss anemia   . Arthritis   . Cancer ([place])    skin  . Central cord syndrome at C6 level of cervical spinal cord ([place]) 11/29/2017  . Hypertension   . Protein-calorie malnutrition, severe ([place]) 01/24/2018  . S/P insertion of IVC (inferior vena caval) filter 01/24/2018  . Tetraparesis [facility])     Past Surgical History:  Procedure Laterality Date  . ANTERIOR CERVICAL DECOMP/DISCECTOMY FUSION N/A 11/29/2017   Procedure: Cervical five-six, six-seven Anterior Cervical Decompression Fusion;  Surgeon: Judith Part, MD;  Location: [place];  Service: Neurosurgery;  Laterality: N/A;  Cervical five-six, six-seven Anterior Cervical Decompression Fusion  . CATARACT EXTRACTION    . IR IVC FILTER PLMT / S&I /IMG GUID/MOD SED  12/08/2017    There were no vitals filed for this visit.   Subjective Assessment - 05/19/20 1640    Subjective  Pt. reports that  she is doing well today.    Patient is accompanied by: Family member    Pertinent History Patient s/p fall November 29, 2017 resulting in diagnosis of central cord syndrome at C 6 level.  She has had therapy in multiple venues but with recent move to [facility], therapy staff recommended  she seek outpatient therapy for her needs.    Currently in Pain? No/denies          OT Treatment  Pt.tolerated bilateral shoulder flexion, extension, abduction, elbow flexion, extension forearm supination AAROM/AROM. PROM bilateral wrist extension, digit MP, PIP, and DIP flexion, and extension in conjunction with moist heatto the bilateral shoulders, and hands.  Pt. Reports that her right shoulder pain has improved.Pt.continues to engageher right hand during ADL, and IADL tasks at home.Pt. reports that she is able to use her right thumb to hold items. Pt. tolerated ROM well without reports of pain, or discomfort. Pt.continues to benefit from working on improving ROM in order to work towards increasing engagement of her bilateral hands during ADLs, and IADL tasks.                           OT Education - 05/19/20 1150    Education Details BUE ROM with functional reaching    Person(s) Educated Patient;Caregiver(s)    Methods Explanation;Demonstration    Comprehension Verbalized understanding;Need further instruction               OT Long Term Goals - 05/07/20 1137      OT LONG TERM GOAL #1   Title Patient and caregiver will demonstrate understanding of home exercise program for ROM.    Baseline Pt. continues  to have a restorative aide rehab aide assist her wih exercises at [facility]    Time 12    Period Weeks    Status On-going    Target Date 07/30/20      OT LONG TERM GOAL #2   Title Patient will demonstrate ability to don shirt with min assist from seated position.    Baseline Pt. is indepedent with a larger shirt, and continues to require increased time to complete,    Time 12    Period Weeks    Status Achieved      OT LONG TERM GOAL #4   Baseline Pt. continues to present with improving finger flexion of R hand for a partial gross grasp, but unable to grasp and hold items. Very limited digit flexion of L hand. Pt. has active left thumb  abduction.    Time 12    Period Weeks    Status On-going    Target Date 07/30/20      OT LONG TERM GOAL #5   Title Pt will complete self feeding with setup, use of universal cuff and minimal spillage.    Baseline inconsistent with feeding and increased spillage. Pt is able to use a fork without spillage. Pt. has spillage with a spoon.    Time 12    Period Weeks    Status Achieved      OT LONG TERM GOAL #6   Title Patient will increase right UE ROM to comb the back of her hair with modified independence.    Baseline Pt. continues to be able to comb the front, and sides of her head. Pt. can reach the back, however cannot see what she is doing in the back of her head.    Time 12    Period Weeks    Status Partially Met    Target Date 07/30/20      OT LONG TERM GOAL #8   Title Pt. will write, and sign her name with 100% legibility, and modified independence.    Baseline Pt. continues to  consistently filling out her daily menu, and writing for crossword puzzles. Pt. is able to maintain grasp on a wide width pen. [place] to work on Media planner.    Time 12    Period Weeks    Status On-going    Target Date 07/30/20                 Plan - 05/19/20 1753    Clinical Impression Statement Pt. Reports that her right shoulder pain has improved.Pt.continues to engageher right hand during ADL, and IADL tasks at home.Pt. reports that she is able to use her right thumb to hold items. Pt. tolerated ROM well without reports of pain, or discomfort. Pt.continues to benefit from working on improving ROM in order to work towards increasing engagement of her bilateral hands during ADLs, and IADL tasks.   OT Occupational Profile and History Detailed Assessment- Review of Records and additional review of physical, cognitive, psychosocial history related to current functional performance    Occupational performance deficits (Please refer to evaluation for details):  ADL's;IADL's;Leisure;Social Participation    Body Structure / Function / Physical Skills ADL;Continence;Dexterity;Flexibility;Strength;ROM;Balance;Coordination;FMC;IADL;Endurance;UE functional use;Decreased knowledge of use of DME;GMC    Psychosocial Skills Environmental  Adaptations;Habits;Routines and Behaviors    Rehab Potential Fair    Clinical Decision Making Limited treatment options, no task modification necessary    Comorbidities Affecting Occupational Performance: Presence of comorbidities impacting occupational performance    Comorbidities  impacting occupational performance description: contractures of bilateral hands, dependent transfers,    Modification or Assistance to Complete Evaluation  No modification of tasks or assist necessary to complete eval    OT Frequency 2x / week    OT Duration 12 weeks    OT Treatment/Interventions Self-care/ADL training;Cryotherapy;Therapeutic exercise;DME and/or AE instruction;Balance training;Neuromuscular education;Manual Therapy;Splinting;Moist Heat;Passive range of motion;Therapeutic activities;Patient/family education    Consulted and Agree with Plan of Care Patient           Patient will benefit from skilled therapeutic intervention in order to improve the following deficits and impairments:   Body Structure / Function / Physical Skills: ADL,Continence,Dexterity,Flexibility,Strength,ROM,Balance,Coordination,FMC,IADL,Endurance,UE functional use,Decreased knowledge of use of [facility]   Psychosocial Skills: Environmental  Adaptations,Habits,Routines and Behaviors   Visit Diagnosis: Muscle weakness (generalized)  Other lack of coordination    Problem List Patient Active Problem List   Diagnosis Date Noted  . Acute appendicitis with appendiceal abscess 05/30/2018  . Hypocalcemia 02/15/2018  . Dysuria 02/15/2018  . Bilateral lower extremity edema 02/11/2018  . Vaginal yeast infection 01/30/2018  . Chest tightness 01/27/2018  .  Healthcare-associated pneumonia 01/25/2018  . Pleural effusion, not elsewhere classified 01/25/2018  . Acute deep vein thrombosis (DVT) of left lower extremity ([place]) 01/24/2018  . Acute deep vein thrombosis (DVT) of right lower extremity ([place]) 01/24/2018  . Chronic allergic rhinitis 01/24/2018  . Depression with anxiety 01/24/2018  . UTI due to Klebsiella species 01/24/2018  . Protein-calorie malnutrition, severe ([place]) 01/24/2018  . S/P insertion of IVC (inferior vena caval) filter 01/24/2018  . Reactive depression   . Hypokalemia   . Leukocytosis   . Essential hypertension   . Trauma   . Tetraparesis ([place])   . Neuropathic pain   . Neurogenic bowel   . Neurogenic bladder   . Benign essential HTN   . Acute blood loss anemia   . Central cord syndrome at C6 level of cervical spinal cord ([place]) 11/29/2017  . Central cord syndrome [facility]) 11/29/2017    Harrel Carina, MS, OTR/L 05/19/2020, 5:56 PM  [place] MAIN [facility] SERVICES [address] [place], [place], [address] Phone: [phone]   Fax:  [phone]  Name: Sherry [name] MRN: [id number] Date of Birth: 1934-06-20

## 2020-05-19 NOTE — Therapy (Signed)
[place] MAIN [facility] SERVICES [address] [place], [place], [address] Phone: [phone]   Fax:  [phone]  Physical Therapy Treatment  Patient Details  Name: Sherry [name] MRN: [id number] Date of Birth: 1934/10/06 Referring Provider (PT): Viviana Simpler   Encounter Date: 05/19/2020   PT End of Session - 05/19/20 1417    Visit Number 57    Number of Visits 79    Date for PT Re-Evaluation 06/30/20    PT Start Time 1147    PT Stop Time 1219    PT Time Calculation (min) 32 min    Equipment Utilized During Treatment Gait belt    Activity Tolerance Patient tolerated treatment well;Patient limited by fatigue    Behavior During Therapy [facility] for tasks assessed/performed           Past Medical History:  Diagnosis Date  . Acute blood loss anemia   . Arthritis   . Cancer ([place])    skin  . Central cord syndrome at C6 level of cervical spinal cord ([place]) 11/29/2017  . Hypertension   . Protein-calorie malnutrition, severe ([place]) 01/24/2018  . S/P insertion of IVC (inferior vena caval) filter 01/24/2018  . Tetraparesis [facility])     Past Surgical History:  Procedure Laterality Date  . ANTERIOR CERVICAL DECOMP/DISCECTOMY FUSION N/A 11/29/2017   Procedure: Cervical five-six, six-seven Anterior Cervical Decompression Fusion;  Surgeon: Judith Part, MD;  Location: [place];  Service: Neurosurgery;  Laterality: N/A;  Cervical five-six, six-seven Anterior Cervical Decompression Fusion  . CATARACT EXTRACTION    . IR IVC FILTER PLMT / S&I /IMG GUID/MOD SED  12/08/2017    There were no vitals filed for this visit.   Subjective Assessment - 05/19/20 1207    Subjective Patient reports having a good Easter and feeling well without any new complaints today.    Patient is accompained by: --   [name] (aide)   Pertinent History Pt is an 86 yo female that fell in 2019, fractured vertebrae in her neck and in her low back per family. Per chart, pt  experienced incomplete quadiparesis at level C6. After hospital stay, pt discharged to [facility] for ~1 month, experienced severe UTI as well as bilateral DVT (IVC filter placed). Discharged to [facility], stayed for about a year, and then moved to [facility] in April 2021. Was receiving PT, but per family facility reported that did not have adequate equipment to maximize PT for her. Pt until about 1 month ago was practicing sit to stand transfers with 1-2 people, and working on static standing. Has a brace for L foot due to PF. Pt currently needs assistance with all ADLs (able to assist with donning/doffing her shirt), bed baths, and needs a hoyer lift for transfers. Able to drive power wheelchair without assistance.    Limitations Standing;Walking;House hold activities;Lifting    How long can you sit comfortably? NA    How long can you stand comfortably? unable    How long can you walk comfortably? unable    Patient Stated Goals "to stand up and walk"    Pain Onset 1 to 4 weeks ago               Interventions:   Seated knee ext/ flex with red disc- AROM as able (minimal on right)  X 20 reps Seated hip abd- AROM x 15 reps Seated hip flex (minimal ability with either LE)  X 15 reps (<25% ROM)  Abdominal crunch x 15  reps (no UE support or physical assist)  Side twist x15 reps   Standing trials:  1) 57 sec - Mod A -Patient unable to maintain static stance with difficulty maintaining Left knee extended  2) 30  sec- stopped due to Patient fatigue and left knee buckling 3)  1 min 58  sec- Much improved ability to maintain standing with min- mod assist- Patient stopped due to fatigue.   Patient requires verbal cues for all standing including maintaining posture, Knee and hip ext.   Clinical Impression: Patient exhibiting more fatigue vs. previous session yet did participate in strengthening abdominal/LE well today. She continues to be limited with standing and will benefit from continued PT  services to increase mobility and strength to improve patient's quality of life.                         PT Education - 05/19/20 1416    Education Details Seated/ Standing postural cues            PT Short Term Goals - 03/10/20 2211      PT SHORT TERM GOAL #1   Title The patient will perform initial HEP with minimum assistance in order to improve strength and function.    Baseline Patient demonstrating independence with initial HEP as of 03/10/2020 with no questions or difficulty.    Time 4    Period Weeks    Status Achieved    Target Date 02/11/20             PT Long Term Goals - 04/21/20 1301      PT LONG TERM GOAL #1   Title The patient will be compliant with finalized HEP with minimum assistance in preparation for self management and maintenance of condition.    Baseline 03/10/2020- Patient familiar with initial HEP and will keep goal active as HEP becomes more progressive including possible transfers and standing activities. 04/07/2020- Patient continues to report compliance with LE strengthening home exercises without questions or concerns. 04/21/2020- Patient able to verbalize and demonstrate good understanding of current HEP including some supine and seated LE strengthening exercises. Reviewed and patient performed 10 reps today with minimal cueing. Will keep goal active to progress HEP as appropriate.    Time 12    Period Weeks    Status On-going    Target Date 06/30/20      PT LONG TERM GOAL #2   Title The patient will demonstrate at least 10 point improvement on FOTO score indicating an improved ability to perform functional activities.    Baseline on eval (/720) score 12; 10/22/19: 12; 12/10/19: 18, 12/13: 18: 04/07/2020- Will assess next visit.    Time 12    Period Weeks    Status On-going    Target Date 06/30/20      PT LONG TERM GOAL #3   Title The patient will demonstrate lateral scooting  for assistance with transfers with minA to maximize  independence.    Baseline 10/22/19: Pt requires modA+1 for lateral scooting; 12/10/19: modA+1, 12/13: modA-maxA, 12/27: modA-maxA, 03/10/2020- ModA-MaxA- increased verbal cues and visual demonstration. 04/07/2020- Continued Max assist to perform forward/lateral scoot. 04/21/2020- Patient was able to demo slight lateral scoot with CGA approx 12 in then fatigued requiring max assist- she is able to scoot forward by leaning without significant issues.    Time 12    Period Weeks    Status On-going    Target Date 06/30/20  PT LONG TERM GOAL #4   Title The patient will demonstrate a squat pivot transfer with minimum assistance to maximize independence and mobility.    Baseline 10/22/19: unable to safely attempt at this time; 12/10/19: unable to safely attempt at this time, 01/14/20: unable to safety attempt at this time. 03/10/2020- Patient able to perform Stand pivot transfer with Maximal assist. 04/07/2020- Patient continues to require Max assist with sit to stand- will assist SPT next visit. 04/21/2020- Patient presents with Max assist to perform Sit to stand Transfer- unable to pivot or move Left LE well without difficulty.    Time 12    Period Weeks    Status On-going    Target Date 06/30/20      PT LONG TERM GOAL #5   Title The patient will demonstrate sitting without UE support for 2-5 minutes at EOB to improve participation and maximize independence with ADLs.    Baseline 10/22/19: Pt able ot sit unsupported for at least 2 minutes at edge of bed. 04/21/2020- Patient continues to demo good sitting at edge of mat > 5 miin without difficulty or back support.    Time 12    Period Weeks    Status Achieved                 Plan - 05/19/20 1417    Clinical Impression Statement Patient exhibiting more fatigue vs. previous session yet did participate in strengthening abdominal/LE well today. She continues to be limited with standing and will benefit from continued PT services to increase mobility and  strength to improve patient's quality of life.    Personal Factors and Comorbidities Age;Time since onset of injury/illness/exacerbation;Comorbidity 3+;Fitness    Comorbidities HTN, quadriparesis, history of DVT, neurogenic bladder    Examination-Activity Limitations Bathing;Hygiene/Grooming;Squat;Bed Mobility;Lift;Bend;Stand;Engineer, manufacturing;Toileting;Self Feeding;Transfers;Continence;Sit;Dressing;Sleep;Carry    Examination-Participation Restrictions Church;Laundry;Cleaning;Medication Management;Community Activity;Meal Prep;Interpersonal Relationship    Stability/Clinical Decision Making Evolving/Moderate complexity    Rehab Potential Fair    PT Frequency 2x / week    PT Duration 12 weeks    PT Treatment/Interventions ADLs/Self Care Home Management;Electrical Stimulation;Therapeutic activities;Wheelchair mobility training;Vasopneumatic Device;Joint Manipulations;Vestibular;Passive range of motion;Patient/family education;Therapeutic exercise;DME Instruction;Biofeedback;Aquatic Therapy;Moist Heat;Gait training;Balance training;Orthotic Fit/Training;Dry needling;Energy conservation;Taping;Splinting;Neuromuscular re-education;Cryotherapy;Ultrasound;Functional mobility training    PT Next Visit Plan Continue with progressive Standing/transfer training.    PT Home Exercise Plan no changes    Consulted and Agree with Plan of Care Patient           Patient will benefit from skilled therapeutic intervention in order to improve the following deficits and impairments:  Abnormal gait,Decreased balance,Decreased endurance,Decreased mobility,Difficulty walking,Hypomobility,Impaired sensation,Decreased range of motion,Improper body mechanics,Impaired perceived functional ability,Decreased activity tolerance,Decreased knowledge of use of DME,Decreased safety awareness,Decreased strength,Impaired flexibility,Impaired UE functional use,Postural dysfunction  Visit Diagnosis: Abnormality of gait  and mobility  Difficulty in walking, not elsewhere classified  Muscle weakness (generalized)  Other abnormalities of gait and mobility  Other lack of coordination     Problem List Patient Active Problem List   Diagnosis Date Noted  . Acute appendicitis with appendiceal abscess 05/30/2018  . Hypocalcemia 02/15/2018  . Dysuria 02/15/2018  . Bilateral lower extremity edema 02/11/2018  . Vaginal yeast infection 01/30/2018  . Chest tightness 01/27/2018  . Healthcare-associated pneumonia 01/25/2018  . Pleural effusion, not elsewhere classified 01/25/2018  . Acute deep vein thrombosis (DVT) of left lower extremity ([place]) 01/24/2018  . Acute deep vein thrombosis (DVT) of right lower extremity ([place]) 01/24/2018  . Chronic allergic rhinitis 01/24/2018  . Depression with anxiety  01/24/2018  . UTI due to Klebsiella species 01/24/2018  . Protein-calorie malnutrition, severe ([place]) 01/24/2018  . S/P insertion of IVC (inferior vena caval) filter 01/24/2018  . Reactive depression   . Hypokalemia   . Leukocytosis   . Essential hypertension   . Trauma   . Tetraparesis ([place])   . Neuropathic pain   . Neurogenic bowel   . Neurogenic bladder   . Benign essential HTN   . Acute blood loss anemia   . Central cord syndrome at C6 level of cervical spinal cord ([place]) 11/29/2017  . Central cord syndrome [facility]) 11/29/2017    Lewis Moccasin, PT 05/20/2020, 10:57 AM  [place] MAIN [facility] SERVICES [address] [place], [place], [address] Phone: [phone]   Fax:  [phone]  Name: Sherry [name] MRN: [id number] Date of Birth: Jul 18, 1934

## 2020-05-21 ENCOUNTER — Ambulatory Visit: Payer: Medicare HMO | Admitting: Occupational Therapy

## 2020-05-21 ENCOUNTER — Other Ambulatory Visit: Payer: Self-pay

## 2020-05-21 ENCOUNTER — Ambulatory Visit: Payer: Medicare HMO

## 2020-05-21 ENCOUNTER — Encounter: Payer: Self-pay | Admitting: Occupational Therapy

## 2020-05-21 DIAGNOSIS — M6281 Muscle weakness (generalized): Secondary | ICD-10-CM | POA: Diagnosis not present

## 2020-05-21 DIAGNOSIS — R278 Other lack of coordination: Secondary | ICD-10-CM

## 2020-05-21 DIAGNOSIS — R262 Difficulty in walking, not elsewhere classified: Secondary | ICD-10-CM

## 2020-05-21 DIAGNOSIS — R269 Unspecified abnormalities of gait and mobility: Secondary | ICD-10-CM

## 2020-05-21 NOTE — Therapy (Signed)
[place] MAIN [facility] SERVICES [address] [place], [place], [address] Phone: [phone]   Fax:  [phone]  Occupational Therapy Treatment  Patient Details  Name: Sherry [name] MRN: [id number] Date of Birth: 07/15/1934 No data recorded  Encounter Date: 05/21/2020   OT End of Session - 05/21/20 1208    Visit Number 33    Number of Visits 39    Date for OT Re-Evaluation 07/30/20    Authorization Time Period Progress report period starting 05/07/2020    OT Start Time 1100    OT Stop Time 1145    OT Time Calculation (min) 45 min    Activity Tolerance Patient tolerated treatment well    Behavior During Therapy [facility] for tasks assessed/performed           Past Medical History:  Diagnosis Date  . Acute blood loss anemia   . Arthritis   . Cancer ([place])    skin  . Central cord syndrome at C6 level of cervical spinal cord ([place]) 11/29/2017  . Hypertension   . Protein-calorie malnutrition, severe ([place]) 01/24/2018  . S/P insertion of IVC (inferior vena caval) filter 01/24/2018  . Tetraparesis [facility])     Past Surgical History:  Procedure Laterality Date  . ANTERIOR CERVICAL DECOMP/DISCECTOMY FUSION N/A 11/29/2017   Procedure: Cervical five-six, six-seven Anterior Cervical Decompression Fusion;  Surgeon: Judith Part, MD;  Location: [place];  Service: Neurosurgery;  Laterality: N/A;  Cervical five-six, six-seven Anterior Cervical Decompression Fusion  . CATARACT EXTRACTION    . IR IVC FILTER PLMT / S&I /IMG GUID/MOD SED  12/08/2017    There were no vitals filed for this visit.   Subjective Assessment - 05/21/20 1207    Subjective  Pt. reports that  she is doing well today.    Patient is accompanied by: Family member    Pertinent History Patient s/p fall November 29, 2017 resulting in diagnosis of central cord syndrome at C 6 level.  She has had therapy in multiple venues but with recent move to [facility], therapy staff recommended  she seek outpatient therapy for her needs.    Patient Stated Goals Patient would like to be able to move better in bed, stand and perform self care tasks.    Currently in Pain? No/denies          OT Treatment  Pt.tolerated bilateral shoulder flexion, extension, abduction, elbow flexion, extension forearm supination AAROM/AROM. PROM bilateral wrist extension, digit MP, PIP, and DIP flexion, and extension in conjunction with moist heat.Pt. worked on bilateral UE reaching with the right and Left UE using the horizontal [employer]. Pt.worked on reaching with incorporating her core to improve trunk control while reaching up, and sliding them to the right through3horizontal rungsto the right, and 3 rungs to the left.  Pt. presents with no pain today, and was able to tolerate ROM, moist heat modality, and functional reaching without pain. Pt.was able  to move the Sa[employer]o rings through 3 horizontal rungs with both the right, and left UEs. Pt.continues to engageher right hand whenperforming self-feeding with adpativeequipment.Pt. Continues to be  able to use her right thumb to hold items. Pt. tolerated ROM well without reports of pain, or discomfort. Pt.continues to benefit from working on improving ROM in order to work towards increasing engagement of her bilateral hands during ADLs, and IADL tasks.  OT Education - 05/21/20 1208    Education Details BUE ROM with functional reaching    Person(s) Educated Patient;Caregiver(s)    Methods Explanation;Demonstration    Comprehension Verbalized understanding;Need further instruction               OT Long Term Goals - 05/07/20 1137      OT LONG TERM GOAL #1   Title Patient and caregiver will demonstrate understanding of home exercise program for ROM.    Baseline Pt. continues to have a restorative aide rehab aide assist her wih exercises at [facility]    Time 12    Period Weeks     Status On-going    Target Date 07/30/20      OT LONG TERM GOAL #2   Title Patient will demonstrate ability to don shirt with min assist from seated position.    Baseline Pt. is indepedent with a larger shirt, and continues to require increased time to complete,    Time 12    Period Weeks    Status Achieved      OT LONG TERM GOAL #4   Baseline Pt. continues to present with improving finger flexion of R hand for a partial gross grasp, but unable to grasp and hold items. Very limited digit flexion of L hand. Pt. has active left thumb abduction.    Time 12    Period Weeks    Status On-going    Target Date 07/30/20      OT LONG TERM GOAL #5   Title Pt will complete self feeding with setup, use of universal cuff and minimal spillage.    Baseline inconsistent with feeding and increased spillage. Pt is able to use a fork without spillage. Pt. has spillage with a spoon.    Time 12    Period Weeks    Status Achieved      OT LONG TERM GOAL #6   Title Patient will increase right UE ROM to comb the back of her hair with modified independence.    Baseline Pt. continues to be able to comb the front, and sides of her head. Pt. can reach the back, however cannot see what she is doing in the back of her head.    Time 12    Period Weeks    Status Partially Met    Target Date 07/30/20      OT LONG TERM GOAL #8   Title Pt. will write, and sign her name with 100% legibility, and modified independence.    Baseline Pt. continues to  consistently filling out her daily menu, and writing for crossword puzzles. Pt. is able to maintain grasp on a wide width pen. [place] to work on Media [employer].    Time 12    Period Weeks    Status On-going    Target Date 07/30/20                 Plan - 05/21/20 1208    Clinical Impression Statement Pt. presents with no pain today, and was able to tolerate ROM, moist heat modality, and functional reaching without pain. Pt.was able  to move the Sa[employer]  rings through 3 horizontal rungs with both the right, and left UEs. Pt.continues to engageher right hand whenperforming self-feeding with adpativeequipment.Pt. Continues to be  able to use her right thumb to hold items. Pt. tolerated ROM well without reports of pain, or discomfort. Pt.continues to benefit from working on improving ROM in order to work towards  increasing engagement of her bilateral hands during ADLs, and IADL tasks.   OT Occupational Profile and History Detailed Assessment- Review of Records and additional review of physical, cognitive, psychosocial history related to current functional performance    Occupational performance deficits (Please refer to evaluation for details): ADL's;IADL's;Leisure;Social Participation    Body Structure / Function / Physical Skills ADL;Continence;Dexterity;Flexibility;Strength;ROM;Balance;Coordination;FMC;IADL;Endurance;UE functional use;Decreased knowledge of use of DME;GMC    Psychosocial Skills Environmental  Adaptations;Habits;Routines and Behaviors    Rehab Potential Fair    Clinical Decision Making Limited treatment options, no task modification necessary    Comorbidities impacting occupational performance description: contractures of bilateral hands, dependent transfers,    Modification or Assistance to Complete Evaluation  No modification of tasks or assist necessary to complete eval    OT Frequency 2x / week    OT Duration 12 weeks    OT Treatment/Interventions Self-care/ADL training;Cryotherapy;Therapeutic exercise;DME and/or AE instruction;Balance training;Neuromuscular education;Manual Therapy;Splinting;Moist Heat;Passive range of motion;Therapeutic activities;Patient/family education    Consulted and Agree with Plan of Care Patient           Patient will benefit from skilled therapeutic intervention in order to improve the following deficits and impairments:   Body Structure / Function / Physical Skills:  ADL,Continence,Dexterity,Flexibility,Strength,ROM,Balance,Coordination,FMC,IADL,Endurance,UE functional use,Decreased knowledge of use of [facility]   Psychosocial Skills: Environmental  Adaptations,Habits,Routines and Behaviors   Visit Diagnosis: Muscle weakness (generalized)  Other lack of coordination    Problem List Patient Active Problem List   Diagnosis Date Noted  . Acute appendicitis with appendiceal abscess 05/30/2018  . Hypocalcemia 02/15/2018  . Dysuria 02/15/2018  . Bilateral lower extremity edema 02/11/2018  . Vaginal yeast infection 01/30/2018  . Chest tightness 01/27/2018  . Healthcare-associated pneumonia 01/25/2018  . Pleural effusion, not elsewhere classified 01/25/2018  . Acute deep vein thrombosis (DVT) of left lower extremity ([place]) 01/24/2018  . Acute deep vein thrombosis (DVT) of right lower extremity ([place]) 01/24/2018  . Chronic allergic rhinitis 01/24/2018  . Depression with anxiety 01/24/2018  . UTI due to Klebsiella species 01/24/2018  . Protein-calorie malnutrition, severe ([place]) 01/24/2018  . S/P insertion of IVC (inferior vena caval) filter 01/24/2018  . Reactive depression   . Hypokalemia   . Leukocytosis   . Essential hypertension   . Trauma   . Tetraparesis ([place])   . Neuropathic pain   . Neurogenic bowel   . Neurogenic bladder   . Benign essential HTN   . Acute blood loss anemia   . Central cord syndrome at C6 level of cervical spinal cord ([place]) 11/29/2017  . Central cord syndrome [facility]) 11/29/2017    Harrel Carina, MS, OTR/L 05/21/2020, 12:11 PM  [place] MAIN [facility] SERVICES [address] [place], [place], [address] Phone: [phone]   Fax:  [phone]  Name: Sherry [name] MRN: [id number] Date of Birth: 03-Jan-1935

## 2020-05-21 NOTE — Therapy (Signed)
[place] MAIN [facility] SERVICES [address] [place], [place], [address] Phone: [phone]   Fax:  [phone]  Physical Therapy Treatment  Patient Details  Name: Sherry [name] MRN: [id number] Date of Birth: 06/11/1934 Referring Provider (PT): Viviana Simpler   Encounter Date: 05/21/2020   PT End of Session - 05/21/20 1203    Visit Number 110    Number of Visits 79    Date for PT Re-Evaluation 06/30/20    PT Start Time 6195    PT Stop Time 1223    PT Time Calculation (min) 38 min    Equipment Utilized During Treatment Gait belt    Activity Tolerance Patient tolerated treatment well;Patient limited by fatigue    Behavior During Therapy [facility] for tasks assessed/performed           Past Medical History:  Diagnosis Date  . Acute blood loss anemia   . Arthritis   . Cancer ([place])    skin  . Central cord syndrome at C6 level of cervical spinal cord ([place]) 11/29/2017  . Hypertension   . Protein-calorie malnutrition, severe ([place]) 01/24/2018  . S/P insertion of IVC (inferior vena caval) filter 01/24/2018  . Tetraparesis [facility])     Past Surgical History:  Procedure Laterality Date  . ANTERIOR CERVICAL DECOMP/DISCECTOMY FUSION N/A 11/29/2017   Procedure: Cervical five-six, six-seven Anterior Cervical Decompression Fusion;  Surgeon: Judith Part, MD;  Location: [place];  Service: Neurosurgery;  Laterality: N/A;  Cervical five-six, six-seven Anterior Cervical Decompression Fusion  . CATARACT EXTRACTION    . IR IVC FILTER PLMT / S&I /IMG GUID/MOD SED  12/08/2017    There were no vitals filed for this visit.   Subjective Assessment - 05/21/20 1201    Subjective Patient denies any pain today and reports doing about the same.    Currently in Pain? No/denies          Interventions:  Therapeutic exercises:   Scap retraction B UE using thin PVC x 12 reps x 2 sets *VC for posture (upright with no back support)   Shoulder flex BUE  using thin PVC pipe x 12 reps x 2 sets *VC for posture (upright with no back support)   Horizontal shoulder ABd using thin PVC pipe x 12 reps x 2 sets *VC for posture (upright with no back support)  AAROM (stretch) with horizontal ABD x 20 sec each side AAROM B hip march x 12 reps AAROM B hip add using red ball  With 3 sec x 12 reps x 2 sets B knee ext x 12 reps (2lb on left) and active on right  B knee ext (eccentric lowering back down) x 10 reps x 2 sets  Education provided throughout session via VC/TC and demonstration to facilitate movement at target joints and correct muscle activation for all testing and exercises performed.  Clinical Impression: Patient able to participate well with seated therex today exhibiting improving eccentric quad control and less overall physical assist with LE strengthening activities. She required some VC to maintain erect sitting posture without resting her back but able to self correct without physical assistance. She will benefit from continued PT services to increase mobility and strength to improve patient's quality of life.                             PT Education - 05/21/20 1202    Education Details specific exercise technique  Person(s) Educated Patient    Methods Explanation;Demonstration;Tactile cues;Verbal cues    Comprehension Tactile cues required;Verbalized understanding;Returned demonstration;Need further instruction;Verbal cues required            PT Short Term Goals - 03/10/20 2211      PT SHORT TERM GOAL #1   Title The patient will perform initial HEP with minimum assistance in order to improve strength and function.    Baseline Patient demonstrating independence with initial HEP as of 03/10/2020 with no questions or difficulty.    Time 4    Period Weeks    Status Achieved    Target Date 02/11/20             PT Long Term Goals - 04/21/20 1301      PT LONG TERM GOAL #1   Title The patient will be  compliant with finalized HEP with minimum assistance in preparation for self management and maintenance of condition.    Baseline 03/10/2020- Patient familiar with initial HEP and will keep goal active as HEP becomes more progressive including possible transfers and standing activities. 04/07/2020- Patient continues to report compliance with LE strengthening home exercises without questions or concerns. 04/21/2020- Patient able to verbalize and demonstrate good understanding of current HEP including some supine and seated LE strengthening exercises. Reviewed and patient performed 10 reps today with minimal cueing. Will keep goal active to progress HEP as appropriate.    Time 12    Period Weeks    Status On-going    Target Date 06/30/20      PT LONG TERM GOAL #2   Title The patient will demonstrate at least 10 point improvement on FOTO score indicating an improved ability to perform functional activities.    Baseline on eval (/720) score 12; 10/22/19: 12; 12/10/19: 18, 12/13: 18: 04/07/2020- Will assess next visit.    Time 12    Period Weeks    Status On-going    Target Date 06/30/20      PT LONG TERM GOAL #3   Title The patient will demonstrate lateral scooting  for assistance with transfers with minA to maximize independence.    Baseline 10/22/19: Pt requires modA+1 for lateral scooting; 12/10/19: modA+1, 12/13: modA-maxA, 12/27: modA-maxA, 03/10/2020- ModA-MaxA- increased verbal cues and visual demonstration. 04/07/2020- Continued Max assist to perform forward/lateral scoot. 04/21/2020- Patient was able to demo slight lateral scoot with CGA approx 12 in then fatigued requiring max assist- she is able to scoot forward by leaning without significant issues.    Time 12    Period Weeks    Status On-going    Target Date 06/30/20      PT LONG TERM GOAL #4   Title The patient will demonstrate a squat pivot transfer with minimum assistance to maximize independence and mobility.    Baseline 10/22/19: unable  to safely attempt at this time; 12/10/19: unable to safely attempt at this time, 01/14/20: unable to safety attempt at this time. 03/10/2020- Patient able to perform Stand pivot transfer with Maximal assist. 04/07/2020- Patient continues to require Max assist with sit to stand- will assist SPT next visit. 04/21/2020- Patient presents with Max assist to perform Sit to stand Transfer- unable to pivot or move Left LE well without difficulty.    Time 12    Period Weeks    Status On-going    Target Date 06/30/20      PT LONG TERM GOAL #5   Title The patient will demonstrate sitting without UE support for  2-5 minutes at EOB to improve participation and maximize independence with ADLs.    Baseline 10/22/19: Pt able ot sit unsupported for at least 2 minutes at edge of bed. 04/21/2020- Patient continues to demo good sitting at edge of mat > 5 miin without difficulty or back support.    Time 12    Period Weeks    Status Achieved                 Plan - 05/21/20 0914    Clinical Impression Statement Patient able to participate well with seated therex today exhibiting improving eccentric quad control and less overall physical assist with LE strengthening activities. She required some VC to maintain erect sitting posture without resting her back but able to self correct without physical assistance. She will benefit from continued PT services to increase mobility and strength to improve patient's quality of life.    Personal Factors and Comorbidities Age;Time since onset of injury/illness/exacerbation;Comorbidity 3+;Fitness    Comorbidities HTN, quadriparesis, history of DVT, neurogenic bladder    Examination-Activity Limitations Bathing;Hygiene/Grooming;Squat;Bed Mobility;Lift;Bend;Stand;Engineer, manufacturing;Toileting;Self Feeding;Transfers;Continence;Sit;Dressing;Sleep;Carry    Examination-Participation Restrictions Church;Laundry;Cleaning;Medication Management;Community Activity;Meal  Prep;Interpersonal Relationship    Stability/Clinical Decision Making Evolving/Moderate complexity    Rehab Potential Fair    PT Frequency 2x / week    PT Duration 12 weeks    PT Treatment/Interventions ADLs/Self Care Home Management;Electrical Stimulation;Therapeutic activities;Wheelchair mobility training;Vasopneumatic Device;Joint Manipulations;Vestibular;Passive range of motion;Patient/family education;Therapeutic exercise;DME Instruction;Biofeedback;Aquatic Therapy;Moist Heat;Gait training;Balance training;Orthotic Fit/Training;Dry needling;Energy conservation;Taping;Splinting;Neuromuscular re-education;Cryotherapy;Ultrasound;Functional mobility training    PT Next Visit Plan Continue with progressive Standing/transfer training.    PT Home Exercise Plan no changes    Consulted and Agree with Plan of Care Patient           Patient will benefit from skilled therapeutic intervention in order to improve the following deficits and impairments:  Abnormal gait,Decreased balance,Decreased endurance,Decreased mobility,Difficulty walking,Hypomobility,Impaired sensation,Decreased range of motion,Improper body mechanics,Impaired perceived functional ability,Decreased activity tolerance,Decreased knowledge of use of DME,Decreased safety awareness,Decreased strength,Impaired flexibility,Impaired UE functional use,Postural dysfunction  Visit Diagnosis: Abnormality of gait and mobility  Difficulty in walking, not elsewhere classified  Muscle weakness (generalized)  Other lack of coordination     Problem List Patient Active Problem List   Diagnosis Date Noted  . Acute appendicitis with appendiceal abscess 05/30/2018  . Hypocalcemia 02/15/2018  . Dysuria 02/15/2018  . Bilateral lower extremity edema 02/11/2018  . Vaginal yeast infection 01/30/2018  . Chest tightness 01/27/2018  . Healthcare-associated pneumonia 01/25/2018  . Pleural effusion, not elsewhere classified 01/25/2018  . Acute  deep vein thrombosis (DVT) of left lower extremity ([place]) 01/24/2018  . Acute deep vein thrombosis (DVT) of right lower extremity ([place]) 01/24/2018  . Chronic allergic rhinitis 01/24/2018  . Depression with anxiety 01/24/2018  . UTI due to Klebsiella species 01/24/2018  . Protein-calorie malnutrition, severe ([place]) 01/24/2018  . S/P insertion of IVC (inferior vena caval) filter 01/24/2018  . Reactive depression   . Hypokalemia   . Leukocytosis   . Essential hypertension   . Trauma   . Tetraparesis ([place])   . Neuropathic pain   . Neurogenic bowel   . Neurogenic bladder   . Benign essential HTN   . Acute blood loss anemia   . Central cord syndrome at C6 level of cervical spinal cord ([place]) 11/29/2017  . Central cord syndrome [facility]) 11/29/2017    Lewis Moccasin, PT 05/22/2020, 9:19 AM  [place] MAIN [facility] SERVICES [place], [place],  [place] Phone: [phone]   Fax:  [phone]  Name: CARLIA BOMKAMP MRN: [id number] Date of Birth: [birth year]

## 2020-05-26 ENCOUNTER — Ambulatory Visit: Payer: Medicare HMO

## 2020-05-26 ENCOUNTER — Ambulatory Visit: Payer: Medicare HMO | Admitting: Occupational Therapy

## 2020-05-26 ENCOUNTER — Encounter: Payer: Self-pay | Admitting: Occupational Therapy

## 2020-05-26 ENCOUNTER — Other Ambulatory Visit: Payer: Self-pay

## 2020-05-26 DIAGNOSIS — M6281 Muscle weakness (generalized): Secondary | ICD-10-CM

## 2020-05-26 DIAGNOSIS — R269 Unspecified abnormalities of gait and mobility: Secondary | ICD-10-CM

## 2020-05-26 DIAGNOSIS — R262 Difficulty in walking, not elsewhere classified: Secondary | ICD-10-CM

## 2020-05-26 DIAGNOSIS — R278 Other lack of coordination: Secondary | ICD-10-CM

## 2020-05-26 NOTE — Therapy (Signed)
[place] MAIN [facility] SERVICES [address] [place], [place], [address] Phone: [phone]   Fax:  [phone]  Occupational Therapy Treatment  Patient Details  Name: Sherry [name] MRN: [id number] Date of Birth: [birth year] No data recorded  Encounter Date: 05/26/2020   OT End of Session - 05/26/20 1644    Visit Number 34    Number of Visits 57    Date for OT Re-Evaluation 07/30/20    Authorization Time Period Progress report period starting 05/07/2020    OT Start Time 1104    OT Stop Time 1145    OT Time Calculation (min) 41 min    Equipment Utilized During Treatment moist heat    Activity Tolerance Patient tolerated treatment well    Behavior During Therapy [facility] for tasks assessed/performed           Past Medical History:  Diagnosis Date  . Acute blood loss anemia   . Arthritis   . Cancer ([place])    skin  . Central cord syndrome at C6 level of cervical spinal cord ([place]) 11/29/2017  . Hypertension   . Protein-calorie malnutrition, severe ([place]) 01/24/2018  . S/P insertion of IVC (inferior vena caval) filter 01/24/2018  . Tetraparesis [facility])     Past Surgical History:  Procedure Laterality Date  . ANTERIOR CERVICAL DECOMP/DISCECTOMY FUSION N/A 11/29/2017   Procedure: Cervical five-six, six-seven Anterior Cervical Decompression Fusion;  Surgeon: Judith Part, MD;  Location: [place];  Service: Neurosurgery;  Laterality: N/A;  Cervical five-six, six-seven Anterior Cervical Decompression Fusion  . CATARACT EXTRACTION    . IR IVC FILTER PLMT / S&I /IMG GUID/MOD SED  12/08/2017    There were no vitals filed for this visit.   Subjective Assessment - 05/26/20 1643    Subjective  Pt. reports that she is doing well today.    Patient is accompanied by: Family member    Pertinent History Patient s/p fall November 29, 2017 resulting in diagnosis of central cord syndrome at C 6 level.  She has had therapy in multiple venues but with  recent move to [facility], therapy staff recommended she seek outpatient therapy for her needs.    Currently in Pain? No/denies          OT Treatment  Pt.tolerated bilateral shoulder flexion, extension, abduction, elbow flexion, extension forearm supination AAROM/AROM. PROM bilateral wrist extension, digit MP, PIP, and DIP flexion, and extension in conjunction with moist heat.Pt. worked on bilateral UE reaching with the right and Left UE using the horizontal [employer]. Pt.worked on reaching with incorporating her core to improve trunk control while reaching up, and sliding them to the right through3horizontal rungsto the right, and 3 rungs to the left.  Pt. presents with no pain today, and was able to tolerate ROM, moist heat modality, and functional reaching without pain. Pt.was able  to move the Sa[place]o rings through 3 horizontal rungs with both the right, and left UEs today. Pt.continues to progress with her RUE, and hand whenperforming self-feeding with adpativeequipment.Pt. continues to be able to use her right thumb to hold items. Pt. tolerated ROM well without reports of pain, or discomfort. Pt.continues to benefit from working on improving ROM in order to work towards increasing engagement of her bilateral hands during ADLs, and IADL tasks.                       OT Education - 05/26/20 1643    Education Details  BUE ROM with functional reaching    Person(s) Educated Patient;Caregiver(s)    Methods Explanation;Demonstration    Comprehension Verbalized understanding;Need further instruction               OT Long Term Goals - 05/07/20 1137      OT LONG TERM GOAL #1   Title Patient and caregiver will demonstrate understanding of home exercise program for ROM.    Baseline Pt. continues to have a restorative aide rehab aide assist her wih exercises at [facility]    Time 12    Period Weeks    Status On-going    Target Date 07/30/20      OT LONG  TERM GOAL #2   Title Patient will demonstrate ability to don shirt with min assist from seated position.    Baseline Pt. is indepedent with a larger shirt, and continues to require increased time to complete,    Time 12    Period Weeks    Status Achieved      OT LONG TERM GOAL #4   Baseline Pt. continues to present with improving finger flexion of R hand for a partial gross grasp, but unable to grasp and hold items. Very limited digit flexion of L hand. Pt. has active left thumb abduction.    Time 12    Period Weeks    Status On-going    Target Date 07/30/20      OT LONG TERM GOAL #5   Title Pt will complete self feeding with setup, use of universal cuff and minimal spillage.    Baseline inconsistent with feeding and increased spillage. Pt is able to use a fork without spillage. Pt. has spillage with a spoon.    Time 12    Period Weeks    Status Achieved      OT LONG TERM GOAL #6   Title Patient will increase right UE ROM to comb the back of her hair with modified independence.    Baseline Pt. continues to be able to comb the front, and sides of her head. Pt. can reach the back, however cannot see what she is doing in the back of her head.    Time 12    Period Weeks    Status Partially Met    Target Date 07/30/20      OT LONG TERM GOAL #8   Title Pt. will write, and sign her name with 100% legibility, and modified independence.    Baseline Pt. continues to  consistently filling out her daily menu, and writing for crossword puzzles. Pt. is able to maintain grasp on a wide width pen. [place] to work on Media [employer].    Time 12    Period Weeks    Status On-going    Target Date 07/30/20                 Plan - 05/26/20 1644    Clinical Impression Statement Pt. presents with no pain today, and was able to tolerate ROM, moist heat modality, and functional reaching without pain. Pt.was able  to move the Saebo rings through 3 horizontal rungs with both the right, and  left UEs today. Pt.continues to progress with her RUE, and hand whenperforming self-feeding with adapativeequipment.Pt. continues to be able to use her right thumb to hold items. Pt. tolerated ROM well without reports of pain, or discomfort. Pt.continues to benefit from working on improving ROM in order to work towards increasing engagement of her bilateral hands during  ADLs, and IADL tasks.   OT Occupational Profile and History Detailed Assessment- Review of Records and additional review of physical, cognitive, psychosocial history related to current functional performance    Occupational performance deficits (Please refer to evaluation for details): ADL's;IADL's;Leisure;Social Participation    Body Structure / Function / Physical Skills ADL;Continence;Dexterity;Flexibility;Strength;ROM;Balance;Coordination;FMC;IADL;Endurance;UE functional use;Decreased knowledge of use of DME;GMC    Psychosocial Skills Environmental  Adaptations;Habits;Routines and Behaviors    Rehab Potential Fair    Clinical Decision Making Limited treatment options, no task modification necessary    Comorbidities Affecting Occupational Performance: Presence of comorbidities impacting occupational performance    Comorbidities impacting occupational performance description: contractures of bilateral hands, dependent transfers,    Modification or Assistance to Complete Evaluation  No modification of tasks or assist necessary to complete eval    OT Frequency 2x / week    OT Duration 12 weeks    OT Treatment/Interventions Self-care/ADL training;Cryotherapy;Therapeutic exercise;DME and/or AE instruction;Balance training;Neuromuscular education;Manual Therapy;Splinting;Moist Heat;Passive range of motion;Therapeutic activities;Patient/family education    Consulted and Agree with Plan of Care Patient           Patient will benefit from skilled therapeutic intervention in order to improve the following deficits and impairments:    Body Structure / Function / Physical Skills: ADL,Continence,Dexterity,Flexibility,Strength,ROM,Balance,Coordination,FMC,IADL,Endurance,UE functional use,Decreased knowledge of use of [facility]   Psychosocial Skills: Environmental  Adaptations,Habits,Routines and Behaviors   Visit Diagnosis: Muscle weakness (generalized)    Problem List Patient Active Problem List   Diagnosis Date Noted  . Acute appendicitis with appendiceal abscess 05/30/2018  . Hypocalcemia 02/15/2018  . Dysuria 02/15/2018  . Bilateral lower extremity edema 02/11/2018  . Vaginal yeast infection 01/30/2018  . Chest tightness 01/27/2018  . Healthcare-associated pneumonia 01/25/2018  . Pleural effusion, not elsewhere classified 01/25/2018  . Acute deep vein thrombosis (DVT) of left lower extremity ([place]) 01/24/2018  . Acute deep vein thrombosis (DVT) of right lower extremity ([place]) 01/24/2018  . Chronic allergic rhinitis 01/24/2018  . Depression with anxiety 01/24/2018  . UTI due to Klebsiella species 01/24/2018  . Protein-calorie malnutrition, severe ([place]) 01/24/2018  . S/P insertion of IVC (inferior vena caval) filter 01/24/2018  . Reactive depression   . Hypokalemia   . Leukocytosis   . Essential hypertension   . Trauma   . Tetraparesis ([place])   . Neuropathic pain   . Neurogenic bowel   . Neurogenic bladder   . Benign essential HTN   . Acute blood loss anemia   . Central cord syndrome at C6 level of cervical spinal cord ([place]) 11/29/2017  . Central cord syndrome [facility]) 11/29/2017    Harrel Carina, MS, OTR/L 05/26/2020, 4:45 PM  [place] MAIN [name] SERVICES [address] [place], [place], [address] Phone: [phone]   Fax:  [phone]  Name: Sherry [name] MRN: [id number] Date of Birth: January 23, 1935

## 2020-05-26 NOTE — Therapy (Signed)
[place] MAIN [facility] SERVICES [address] [place], [place], [address] Phone: [phone]   Fax:  [phone]  Physical Therapy Treatment  Patient Details  Name: Sherry [name] MRN: [id number] Date of Birth: 12-24-1934 Referring Provider (PT): Viviana Simpler   Encounter Date: 05/26/2020   PT End of Session - 05/26/20 1151    Visit Number 36    Number of Visits 59    Date for PT Re-Evaluation 06/30/20    PT Start Time 1146    PT Stop Time 1225    PT Time Calculation (min) 39 min    Equipment Utilized During Treatment Gait belt    Activity Tolerance Patient tolerated treatment well;Patient limited by fatigue    Behavior During Therapy [facility] for tasks assessed/performed           Past Medical History:  Diagnosis Date  . Acute blood loss anemia   . Arthritis   . Cancer ([place])    skin  . Central cord syndrome at C6 level of cervical spinal cord ([place]) 11/29/2017  . Hypertension   . Protein-calorie malnutrition, severe ([place]) 01/24/2018  . S/P insertion of IVC (inferior vena caval) filter 01/24/2018  . Tetraparesis [facility])     Past Surgical History:  Procedure Laterality Date  . ANTERIOR CERVICAL DECOMP/DISCECTOMY FUSION N/A 11/29/2017   Procedure: Cervical five-six, six-seven Anterior Cervical Decompression Fusion;  Surgeon: Judith Part, MD;  Location: [place];  Service: Neurosurgery;  Laterality: N/A;  Cervical five-six, six-seven Anterior Cervical Decompression Fusion  . CATARACT EXTRACTION    . IR IVC FILTER PLMT / S&I /IMG GUID/MOD SED  12/08/2017    There were no vitals filed for this visit.   Subjective Assessment - 05/26/20 1150    Subjective Patient reports no new issues since last week. Denies any pain.    Currently in Pain? No/denies           Interventions:     Standing trials:  1) 2 min 28  sec - Mod A -Patient unable to maintain static stance with difficulty maintaining Left knee extended  2) 2 min 07    sec- stopped due to Patient fatigue and left knee buckling 3)  1 min 30 sec- Patient stopped due to Left knee soreness. Patient able to perform some slight dynamic weight shifting without knee buckling yet fatigues quicker.  4)  1 min 45 sec - Patient able to stand well with less overall cues on last stand- Able to stand with erect posture   Patient requires verbaltactile cues for all standing including maintaining posture, Knee and hip ext.   Clinical Impression: Patient continues to exhibit improved overall standing times and more consistent overall. She does not have enough functional strength to attempt a SPT  But overall making progress toward the functional strength required to attempt in future.  Patient will benefit from continued PT services to increase mobility and strength to improve patient's quality of life.                                               PT Education - 05/27/20 0907    Education Details Postural cues with standing    Person(s) Educated Patient    Methods Explanation;Tactile cues;Demonstration;Verbal cues    Comprehension Verbalized understanding;Verbal cues required;Need further instruction;Returned demonstration;Tactile cues required  PT Short Term Goals - 03/10/20 2211      PT SHORT TERM GOAL #1   Title The patient will perform initial HEP with minimum assistance in order to improve strength and function.    Baseline Patient demonstrating independence with initial HEP as of 03/10/2020 with no questions or difficulty.    Time 4    Period Weeks    Status Achieved    Target Date 02/11/20             PT Long Term Goals - 04/21/20 1301      PT LONG TERM GOAL #1   Title The patient will be compliant with finalized HEP with minimum assistance in preparation for self management and maintenance of condition.    Baseline 03/10/2020- Patient familiar with initial HEP and will keep goal active as HEP becomes  more progressive including possible transfers and standing activities. 04/07/2020- Patient continues to report compliance with LE strengthening home exercises without questions or concerns. 04/21/2020- Patient able to verbalize and demonstrate good understanding of current HEP including some supine and seated LE strengthening exercises. Reviewed and patient performed 10 reps today with minimal cueing. Will keep goal active to progress HEP as appropriate.    Time 12    Period Weeks    Status On-going    Target Date 06/30/20      PT LONG TERM GOAL #2   Title The patient will demonstrate at least 10 point improvement on FOTO score indicating an improved ability to perform functional activities.    Baseline on eval (/720) score 12; 10/22/19: 12; 12/10/19: 18, 12/13: 18: 04/07/2020- Will assess next visit.    Time 12    Period Weeks    Status On-going    Target Date 06/30/20      PT LONG TERM GOAL #3   Title The patient will demonstrate lateral scooting  for assistance with transfers with minA to maximize independence.    Baseline 10/22/19: Pt requires modA+1 for lateral scooting; 12/10/19: modA+1, 12/13: modA-maxA, 12/27: modA-maxA, 03/10/2020- ModA-MaxA- increased verbal cues and visual demonstration. 04/07/2020- Continued Max assist to perform forward/lateral scoot. 04/21/2020- Patient was able to demo slight lateral scoot with CGA approx 12 in then fatigued requiring max assist- she is able to scoot forward by leaning without significant issues.    Time 12    Period Weeks    Status On-going    Target Date 06/30/20      PT LONG TERM GOAL #4   Title The patient will demonstrate a squat pivot transfer with minimum assistance to maximize independence and mobility.    Baseline 10/22/19: unable to safely attempt at this time; 12/10/19: unable to safely attempt at this time, 01/14/20: unable to safety attempt at this time. 03/10/2020- Patient able to perform Stand pivot transfer with Maximal assist. 04/07/2020-  Patient continues to require Max assist with sit to stand- will assist SPT next visit. 04/21/2020- Patient presents with Max assist to perform Sit to stand Transfer- unable to pivot or move Left LE well without difficulty.    Time 12    Period Weeks    Status On-going    Target Date 06/30/20      PT LONG TERM GOAL #5   Title The patient will demonstrate sitting without UE support for 2-5 minutes at EOB to improve participation and maximize independence with ADLs.    Baseline 10/22/19: Pt able ot sit unsupported for at least 2 minutes at edge of bed. 04/21/2020- Patient continues  to demo good sitting at edge of mat > 5 miin without difficulty or back support.    Time 12    Period Weeks    Status Achieved                 Plan - 05/26/20 1152    Clinical Impression Statement Patient continues to exhibit improved overall standing times and more consistent overall. She does not have enough functional strength to attempt a SPT  But overall making progress toward the functional strength required to attempt in future.  Patient will benefit from continued PT services to increase mobility and strength to improve patient's quality of life.    Personal Factors and Comorbidities Age;Time since onset of injury/illness/exacerbation;Comorbidity 3+;Fitness    Comorbidities HTN, quadriparesis, history of DVT, neurogenic bladder    Examination-Activity Limitations Bathing;Hygiene/Grooming;Squat;Bed Mobility;Lift;Bend;Stand;Engineer, manufacturing;Toileting;Self Feeding;Transfers;Continence;Sit;Dressing;Sleep;Carry    Examination-Participation Restrictions Church;Laundry;Cleaning;Medication Management;Community Activity;Meal Prep;Interpersonal Relationship    Stability/Clinical Decision Making Evolving/Moderate complexity    Rehab Potential Fair    PT Frequency 2x / week    PT Duration 12 weeks    PT Treatment/Interventions ADLs/Self Care Home Management;Electrical Stimulation;Therapeutic  activities;Wheelchair mobility training;Vasopneumatic Device;Joint Manipulations;Vestibular;Passive range of motion;Patient/family education;Therapeutic exercise;DME Instruction;Biofeedback;Aquatic Therapy;Moist Heat;Gait training;Balance training;Orthotic Fit/Training;Dry needling;Energy conservation;Taping;Splinting;Neuromuscular re-education;Cryotherapy;Ultrasound;Functional mobility training    PT Next Visit Plan Continue with progressive Standing/transfer training.    PT Home Exercise Plan no changes    Consulted and Agree with Plan of Care Patient           Patient will benefit from skilled therapeutic intervention in order to improve the following deficits and impairments:  Abnormal gait,Decreased balance,Decreased endurance,Decreased mobility,Difficulty walking,Hypomobility,Impaired sensation,Decreased range of motion,Improper body mechanics,Impaired perceived functional ability,Decreased activity tolerance,Decreased knowledge of use of DME,Decreased safety awareness,Decreased strength,Impaired flexibility,Impaired UE functional use,Postural dysfunction  Visit Diagnosis: Abnormality of gait and mobility  Difficulty in walking, not elsewhere classified  Muscle weakness (generalized)  Other lack of coordination     Problem List Patient Active Problem List   Diagnosis Date Noted  . Acute appendicitis with appendiceal abscess 05/30/2018  . Hypocalcemia 02/15/2018  . Dysuria 02/15/2018  . Bilateral lower extremity edema 02/11/2018  . Vaginal yeast infection 01/30/2018  . Chest tightness 01/27/2018  . Healthcare-associated pneumonia 01/25/2018  . Pleural effusion, not elsewhere classified 01/25/2018  . Acute deep vein thrombosis (DVT) of left lower extremity ([place]) 01/24/2018  . Acute deep vein thrombosis (DVT) of right lower extremity ([place]) 01/24/2018  . Chronic allergic rhinitis 01/24/2018  . Depression with anxiety 01/24/2018  . UTI due to Klebsiella species 01/24/2018  .  Protein-calorie malnutrition, severe ([place]) 01/24/2018  . S/P insertion of IVC (inferior vena caval) filter 01/24/2018  . Reactive depression   . Hypokalemia   . Leukocytosis   . Essential hypertension   . Trauma   . Tetraparesis ([place])   . Neuropathic pain   . Neurogenic bowel   . Neurogenic bladder   . Benign essential HTN   . Acute blood loss anemia   . Central cord syndrome at C6 level of cervical spinal cord ([place]) 11/29/2017  . Central cord syndrome [facility]) 11/29/2017    Lewis Moccasin, PT 05/27/2020, 9:12 AM  [place] MAIN [facility] SERVICES [address] [place], [place], [address] Phone: [phone]   Fax:  [phone]  Name: GARGI BERCH MRN: [id number] Date of Birth: September 09, 1934

## 2020-05-28 ENCOUNTER — Ambulatory Visit: Payer: Medicare HMO

## 2020-05-28 ENCOUNTER — Encounter: Payer: Self-pay | Admitting: Occupational Therapy

## 2020-05-28 ENCOUNTER — Other Ambulatory Visit: Payer: Self-pay

## 2020-05-28 ENCOUNTER — Ambulatory Visit: Payer: Medicare HMO | Admitting: Occupational Therapy

## 2020-05-28 DIAGNOSIS — M6281 Muscle weakness (generalized): Secondary | ICD-10-CM

## 2020-05-28 DIAGNOSIS — R262 Difficulty in walking, not elsewhere classified: Secondary | ICD-10-CM

## 2020-05-28 DIAGNOSIS — R278 Other lack of coordination: Secondary | ICD-10-CM

## 2020-05-28 DIAGNOSIS — R269 Unspecified abnormalities of gait and mobility: Secondary | ICD-10-CM

## 2020-05-28 NOTE — Therapy (Signed)
[place] MAIN [facility] SERVICES [address] [place], [place], [address] Phone: [phone]   Fax:  [phone]  Occupational Therapy Treatment  Patient Details  Name: Sherry [name] MRN: [id number] Date of Birth: [birth year] No data recorded  Encounter Date: 05/28/2020   OT End of Session - 05/28/20 1114    Visit Number 35    Number of Visits 65    Date for OT Re-Evaluation 07/30/20    Authorization Time Period Progress report period starting 05/07/2020    OT Start Time 1104    OT Stop Time 1145    OT Time Calculation (min) 41 min    Activity Tolerance Patient tolerated treatment well    Behavior During Therapy [facility] for tasks assessed/performed           Past Medical History:  Diagnosis Date  . Acute blood loss anemia   . Arthritis   . Cancer ([place])    skin  . Central cord syndrome at C6 level of cervical spinal cord ([place]) 11/29/2017  . Hypertension   . Protein-calorie malnutrition, severe ([place]) 01/24/2018  . S/P insertion of IVC (inferior vena caval) filter 01/24/2018  . Tetraparesis [facility])     Past Surgical History:  Procedure Laterality Date  . ANTERIOR CERVICAL DECOMP/DISCECTOMY FUSION N/A 11/29/2017   Procedure: Cervical five-six, six-seven Anterior Cervical Decompression Fusion;  Surgeon: Judith Part, MD;  Location: [place];  Service: Neurosurgery;  Laterality: N/A;  Cervical five-six, six-seven Anterior Cervical Decompression Fusion  . CATARACT EXTRACTION    . IR IVC FILTER PLMT / S&I /IMG GUID/MOD SED  12/08/2017    There were no vitals filed for this visit.   Subjective Assessment - 05/28/20 1113    Subjective  Pt. reports that she is doing well today.    Patient is accompanied by: Family member    Pertinent History Patient s/p fall November 29, 2017 resulting in diagnosis of central cord syndrome at C 6 level.  She has had therapy in multiple venues but with recent move to [facility], therapy staff recommended she  seek outpatient therapy for her needs.    Patient Stated Goals Patient would like to be able to move better in bed, stand and perform self care tasks.    Currently in Pain? No/denies           OT Treatment  Pt.tolerated bilateral shoulder flexion, extension, abduction, elbow flexion, extension forearm supination AAROM/AROM. PROM bilateral wrist extension, digit MP, PIP, and DIP flexion, extension, and thumb radial, and palmar abduction, and opposition in conjunction with moist heat. Emphasis was placed on left wrist extension. Pt. Worked on active hand to face patterns , and actively reaching up to her hair.    Pt. presents with no pain today, and was able to tolerate ROM, moist heat modality, and functional reaching without pain.  Pt. continues to present with limited left wrist extension, and digit flexion. Pt.continues to progress with her RUE, and hand whenperforming self-feeding with adpativeequipment.Pt.continues to beable to use her right thumb to hold items. Pt. tolerated ROM well without reports of pain, or discomfort. Pt.continues to benefit from working on improving ROM in order to work towards increasing engagement of her bilateral hands during ADLs, and IADL tasks.                       OT Education - 05/28/20 1114    Education Details BUE ROM with functional reaching  Person(s) Educated Patient;Caregiver(s)    Methods Explanation;Demonstration    Comprehension Verbalized understanding;Need further instruction               OT Long Term Goals - 05/07/20 1137      OT LONG TERM GOAL #1   Title Patient and caregiver will demonstrate understanding of home exercise program for ROM.    Baseline Pt. continues to have a restorative aide rehab aide assist her wih exercises at [facility]    Time 12    Period Weeks    Status On-going    Target Date 07/30/20      OT LONG TERM GOAL #2   Title Patient will demonstrate ability to don shirt with  min assist from seated position.    Baseline Pt. is indepedent with a larger shirt, and continues to require increased time to complete,    Time 12    Period Weeks    Status Achieved      OT LONG TERM GOAL #4   Baseline Pt. continues to present with improving finger flexion of R hand for a partial gross grasp, but unable to grasp and hold items. Very limited digit flexion of L hand. Pt. has active left thumb abduction.    Time 12    Period Weeks    Status On-going    Target Date 07/30/20      OT LONG TERM GOAL #5   Title Pt will complete self feeding with setup, use of universal cuff and minimal spillage.    Baseline inconsistent with feeding and increased spillage. Pt is able to use a fork without spillage. Pt. has spillage with a spoon.    Time 12    Period Weeks    Status Achieved      OT LONG TERM GOAL #6   Title Patient will increase right UE ROM to comb the back of her hair with modified independence.    Baseline Pt. continues to be able to comb the front, and sides of her head. Pt. can reach the back, however cannot see what she is doing in the back of her head.    Time 12    Period Weeks    Status Partially Met    Target Date 07/30/20      OT LONG TERM GOAL #8   Title Pt. will write, and sign her name with 100% legibility, and modified independence.    Baseline Pt. continues to  consistently filling out her daily menu, and writing for crossword puzzles. Pt. is able to maintain grasp on a wide width pen. [place] to work on Media planner.    Time 12    Period Weeks    Status On-going    Target Date 07/30/20                 Plan - 05/28/20 1115    Clinical Impression Statement Pt. presents with no pain today, and was able to tolerate ROM, moist heat modality, and functional reaching without pain.  Pt. continues to present with limited left wrist extension, and digit flexion. Pt.continues to progress with her RUE, and hand whenperforming self-feeding with  adpativeequipment.Pt.continues to beable to use her right thumb to hold items. Pt. tolerated ROM well without reports of pain, or discomfort. Pt.continues to benefit from working on improving ROM in order to work towards increasing engagement of her bilateral hands during ADLs, and IADL tasks.    OT Occupational Profile and History Detailed Assessment- Review of Records  and additional review of physical, cognitive, psychosocial history related to current functional performance    Occupational performance deficits (Please refer to evaluation for details): ADL's;IADL's;Leisure;Social Participation    Body Structure / Function / Physical Skills ADL;Continence;Dexterity;Flexibility;Strength;ROM;Balance;Coordination;FMC;IADL;Endurance;UE functional use;Decreased knowledge of use of DME;GMC    Psychosocial Skills Environmental  Adaptations;Habits;Routines and Behaviors    Rehab Potential Fair    Clinical Decision Making Limited treatment options, no task modification necessary    Comorbidities Affecting Occupational Performance: Presence of comorbidities impacting occupational performance    Comorbidities impacting occupational performance description: contractures of bilateral hands, dependent transfers,    Modification or Assistance to Complete Evaluation  No modification of tasks or assist necessary to complete eval    OT Duration 12 weeks    OT Treatment/Interventions Self-care/ADL training;Cryotherapy;Therapeutic exercise;DME and/or AE instruction;Balance training;Neuromuscular education;Manual Therapy;Splinting;Moist Heat;Passive range of motion;Therapeutic activities;Patient/family education    Consulted and Agree with Plan of Care Patient           Patient will benefit from skilled therapeutic intervention in order to improve the following deficits and impairments:   Body Structure / Function / Physical Skills:  ADL,Continence,Dexterity,Flexibility,Strength,ROM,Balance,Coordination,FMC,IADL,Endurance,UE functional use,Decreased knowledge of use of [facility]   Psychosocial Skills: Environmental  Adaptations,Habits,Routines and Behaviors   Visit Diagnosis: Muscle weakness (generalized)    Problem List Patient Active Problem List   Diagnosis Date Noted  . Acute appendicitis with appendiceal abscess 05/30/2018  . Hypocalcemia 02/15/2018  . Dysuria 02/15/2018  . Bilateral lower extremity edema 02/11/2018  . Vaginal yeast infection 01/30/2018  . Chest tightness 01/27/2018  . Healthcare-associated pneumonia 01/25/2018  . Pleural effusion, not elsewhere classified 01/25/2018  . Acute deep vein thrombosis (DVT) of left lower extremity ([place]) 01/24/2018  . Acute deep vein thrombosis (DVT) of right lower extremity ([place]) 01/24/2018  . Chronic allergic rhinitis 01/24/2018  . Depression with anxiety 01/24/2018  . UTI due to Klebsiella species 01/24/2018  . Protein-calorie malnutrition, severe ([place]) 01/24/2018  . S/P insertion of IVC (inferior vena caval) filter 01/24/2018  . Reactive depression   . Hypokalemia   . Leukocytosis   . Essential hypertension   . Trauma   . Tetraparesis ([place])   . Neuropathic pain   . Neurogenic bowel   . Neurogenic bladder   . Benign essential HTN   . Acute blood loss anemia   . Central cord syndrome at C6 level of cervical spinal cord ([place]) 11/29/2017  . Central cord syndrome [facility]) 11/29/2017    Harrel Carina, MS, OTR/L 05/28/2020, 1:38 PM  [place] MAIN [facility] SERVICES [address] [place], [place], [address] Phone: [phone]   Fax:  [phone]  Name: Sherry [name] MRN: [id number] Date of Birth: 10/01/1934

## 2020-05-29 NOTE — Therapy (Signed)
[place] MAIN [facility] SERVICES [address] [place], [place], [address] Phone: [phone]   Fax:  [phone]  Physical Therapy Treatment/Physical Therapy Progress Note   Dates of reporting period  04/21/2020  to   05/28/2020  Patient Details  Name: Sherry [name] MRN: [id number] Date of Birth: May 27, 1934 Referring Provider (PT): Viviana Simpler   Encounter Date: 05/28/2020   PT End of Session - 05/28/20 1154    Visit Number 60    Number of Visits 13    Date for PT Re-Evaluation 06/30/20    PT Start Time 1147    PT Stop Time 1225    PT Time Calculation (min) 38 min    Equipment Utilized During Treatment Gait belt    Activity Tolerance Patient tolerated treatment well;Patient limited by fatigue    Behavior During Therapy [facility] for tasks assessed/performed           Past Medical History:  Diagnosis Date  . Acute blood loss anemia   . Arthritis   . Cancer ([place])    skin  . Central cord syndrome at C6 level of cervical spinal cord ([place]) 11/29/2017  . Hypertension   . Protein-calorie malnutrition, severe ([place]) 01/24/2018  . S/P insertion of IVC (inferior vena caval) filter 01/24/2018  . Tetraparesis [facility])     Past Surgical History:  Procedure Laterality Date  . ANTERIOR CERVICAL DECOMP/DISCECTOMY FUSION N/A 11/29/2017   Procedure: Cervical five-six, six-seven Anterior Cervical Decompression Fusion;  Surgeon: Judith Part, MD;  Location: [place];  Service: Neurosurgery;  Laterality: N/A;  Cervical five-six, six-seven Anterior Cervical Decompression Fusion  . CATARACT EXTRACTION    . IR IVC FILTER PLMT / S&I /IMG GUID/MOD SED  12/08/2017    There were no vitals filed for this visit.   Subjective Assessment - 05/28/20 1150    Subjective Patient reports feeling well today with no pain or new issues.    Currently in Pain? No/denies          Reassessed LTGs today and patient performed the following:  Transfer training:  Patient performed transfer from [place] to mat today- Max A with cues for scooting/assist with feet position. Patient unable to scoot independent or position feet on her own.  Patient required Max assist to stand pivot transfer from chair to Mat. She was able to minimally pivot move Left LE today which was an improvement but unable to successfully use BUE functionally during transfer. Patient performed 2 trials back and forth today along with minimal ability to lateral scoot- VC and visual demo to shift her weight over and use forearms on armrest as able. Patient required increased time to perform minimal movement.     Clinical Impression: Patient was able to demonstrate some progress requiring less physical assist with scooting forward and lateral today. Patient also able to minimally move Left LE with pivot transfer as previously unable. Patient remains motivated during session and Patient's condition has the potential to improve in response to therapy. Maximum improvement is yet to be obtained. The anticipated improvement is attainable and reasonable in a generally predictable time.                          PT Education - 05/29/20 0750    Education Details Transfer cues for safety    Person(s) Educated Patient    Methods Explanation;Demonstration;Tactile cues;Verbal cues    Comprehension Verbalized understanding;Tactile cues required;Returned demonstration;Need further instruction;Verbal cues required  PT Short Term Goals - 03/10/20 2211      PT SHORT TERM GOAL #1   Title The patient will perform initial HEP with minimum assistance in order to improve strength and function.    Baseline Patient demonstrating independence with initial HEP as of 03/10/2020 with no questions or difficulty.    Time 4    Period Weeks    Status Achieved    Target Date 02/11/20             PT Long Term Goals - 05/28/20 1408      PT LONG TERM GOAL #1   Title The  patient will be compliant with finalized HEP with minimum assistance in preparation for self management and maintenance of condition.    Baseline 03/10/2020- Patient familiar with initial HEP and will keep goal active as HEP becomes more progressive including possible transfers and standing activities. 04/07/2020- Patient continues to report compliance with LE strengthening home exercises without questions or concerns. 04/21/2020- Patient able to verbalize and demonstrate good understanding of current HEP including some supine and seated LE strengthening exercises. Reviewed and patient performed 10 reps today with minimal cueing. Will keep goal active to progress HEP as appropriate. 05/28/2020- patient reports continues to perform LE strengthening as able and some help from caregiver as able. No changes at this time to home program as patient is limited to supine/seated therex.    Time 12    Period Weeks    Status Achieved      PT LONG TERM GOAL #2   Title The patient will demonstrate at least 10 point improvement on FOTO score indicating an improved ability to perform functional activities.    Baseline on eval (/720) score 12; 10/22/19: 12; 12/10/19: 18, 12/13: 18: 04/07/2020- Will assess next visit.    Time 12    Period Weeks    Status On-going    Target Date 06/30/20      PT LONG TERM GOAL #3   Title The patient will demonstrate lateral scooting  for assistance with transfers with minA to maximize independence.    Baseline 10/22/19: Pt requires modA+1 for lateral scooting; 12/10/19: modA+1, 12/13: modA-maxA, 12/27: modA-maxA, 03/10/2020- ModA-MaxA- increased verbal cues and visual demonstration. 04/07/2020- Continued Max assist to perform forward/lateral scoot. 04/21/2020- Patient was able to demo slight lateral scoot with CGA approx 12 in then fatigued requiring max assist- she is able to scoot forward by leaning without significant issues. 05/28/2020- While sitting in chair- patient able to perform forward  and lateral scoot with VC/visual demo and increased time allowed- CGA today but not consistent yet- will keep goal active at this time to continue to focus on strengthening and improving technique.    Time 12    Period Weeks    Status On-going    Target Date 06/30/20      PT LONG TERM GOAL #4   Title The patient will demonstrate a squat pivot transfer with minimum assistance to maximize independence and mobility.    Baseline 10/22/19: unable to safely attempt at this time; 12/10/19: unable to safely attempt at this time, 01/14/20: unable to safety attempt at this time. 03/10/2020- Patient able to perform Stand pivot transfer with Maximal assist. 04/07/2020- Patient continues to require Max assist with sit to stand- will assist SPT next visit. 04/21/2020- Patient presents with Max assist to perform Sit to stand Transfer- unable to pivot or move Left LE well without difficulty.    Time 12    Period  Weeks    Status On-going      PT LONG TERM GOAL #5   Title The patient will demonstrate sitting without UE support for 2-5 minutes at EOB to improve participation and maximize independence with ADLs.    Baseline 10/22/19: Pt able ot sit unsupported for at least 2 minutes at edge of bed. 04/21/2020- Patient continues to demo good sitting at edge of mat > 5 miin without difficulty or back support.    Time 12    Period Weeks    Status Achieved      Additional Long Term Goals   Additional Long Term Goals Yes      PT LONG TERM GOAL #6   Title Patient will demonstrate improved functional LE strength as seen by consistent ability to stand > 2 min (3 out of 4 trials) with Mod/Max A for improved LE strength with transfers.    Baseline 05/28/2020- Patient able to inconsistent stand 1-2 min right now in //bars with Mod/Max A.    Time 4    Period Weeks    Status New    Target Date 06/30/20                 Plan - 05/28/20 6269    Clinical Impression Statement Patient was able to demonstrate some progress  requiring less physical assist with scooting forward and lateral today. Patient also able to minimally move Left LE with pivot transfer as previously unable. Patient remains motivated during session and Patient's condition has the potential to improve in response to therapy. Maximum improvement is yet to be obtained. The anticipated improvement is attainable and reasonable in a generally predictable time.    Personal Factors and Comorbidities Age;Time since onset of injury/illness/exacerbation;Comorbidity 3+;Fitness    Comorbidities HTN, quadriparesis, history of DVT, neurogenic bladder    Examination-Activity Limitations Bathing;Hygiene/Grooming;Squat;Bed Mobility;Lift;Bend;Stand;Engineer, manufacturing;Toileting;Self Feeding;Transfers;Continence;Sit;Dressing;Sleep;Carry    Examination-Participation Restrictions Church;Laundry;Cleaning;Medication Management;Community Activity;Meal Prep;Interpersonal Relationship    Stability/Clinical Decision Making Evolving/Moderate complexity    Rehab Potential Fair    PT Frequency 2x / week    PT Duration 12 weeks    PT Treatment/Interventions ADLs/Self Care Home Management;Electrical Stimulation;Therapeutic activities;Wheelchair mobility training;Vasopneumatic Device;Joint Manipulations;Vestibular;Passive range of motion;Patient/family education;Therapeutic exercise;DME Instruction;Biofeedback;Aquatic Therapy;Moist Heat;Gait training;Balance training;Orthotic Fit/Training;Dry needling;Energy conservation;Taping;Splinting;Neuromuscular re-education;Cryotherapy;Ultrasound;Functional mobility training    PT Next Visit Plan Continue with progressive Standing/transfer training.    PT Home Exercise Plan no changes    Consulted and Agree with Plan of Care Patient           Patient will benefit from skilled therapeutic intervention in order to improve the following deficits and impairments:  Abnormal gait,Decreased balance,Decreased endurance,Decreased  mobility,Difficulty walking,Hypomobility,Impaired sensation,Decreased range of motion,Improper body mechanics,Impaired perceived functional ability,Decreased activity tolerance,Decreased knowledge of use of DME,Decreased safety awareness,Decreased strength,Impaired flexibility,Impaired UE functional use,Postural dysfunction  Visit Diagnosis: Abnormality of gait and mobility  Difficulty in walking, not elsewhere classified  Muscle weakness (generalized)  Other lack of coordination     Problem List Patient Active Problem List   Diagnosis Date Noted  . Acute appendicitis with appendiceal abscess 05/30/2018  . Hypocalcemia 02/15/2018  . Dysuria 02/15/2018  . Bilateral lower extremity edema 02/11/2018  . Vaginal yeast infection 01/30/2018  . Chest tightness 01/27/2018  . Healthcare-associated pneumonia 01/25/2018  . Pleural effusion, not elsewhere classified 01/25/2018  . Acute deep vein thrombosis (DVT) of left lower extremity ([place]) 01/24/2018  . Acute deep vein thrombosis (DVT) of right lower extremity ([place]) 01/24/2018  . Chronic allergic rhinitis 01/24/2018  .  Depression with anxiety 01/24/2018  . UTI due to Klebsiella species 01/24/2018  . Protein-calorie malnutrition, severe ([place]) 01/24/2018  . S/P insertion of IVC (inferior vena caval) filter 01/24/2018  . Reactive depression   . Hypokalemia   . Leukocytosis   . Essential hypertension   . Trauma   . Tetraparesis ([place])   . Neuropathic pain   . Neurogenic bowel   . Neurogenic bladder   . Benign essential HTN   . Acute blood loss anemia   . Central cord syndrome at C6 level of cervical spinal cord ([place]) 11/29/2017  . Central cord syndrome [facility]) 11/29/2017    Lewis Moccasin, PT 05/29/2020, 3:10 PM  [place] MAIN [facility] SERVICES [address] [place], [place], [address] Phone: [phone]   Fax:  [phone]  Name: JESSAMINE MCLIN MRN: [id number] Date of Birth:  09/17/1934

## 2020-06-02 ENCOUNTER — Other Ambulatory Visit: Payer: Self-pay

## 2020-06-02 ENCOUNTER — Ambulatory Visit: Payer: Medicare HMO

## 2020-06-02 ENCOUNTER — Ambulatory Visit: Payer: Medicare HMO | Attending: Internal Medicine | Admitting: Occupational Therapy

## 2020-06-02 ENCOUNTER — Encounter: Payer: Self-pay | Admitting: Occupational Therapy

## 2020-06-02 DIAGNOSIS — R278 Other lack of coordination: Secondary | ICD-10-CM | POA: Diagnosis present

## 2020-06-02 DIAGNOSIS — R269 Unspecified abnormalities of gait and mobility: Secondary | ICD-10-CM | POA: Diagnosis present

## 2020-06-02 DIAGNOSIS — M6281 Muscle weakness (generalized): Secondary | ICD-10-CM | POA: Diagnosis not present

## 2020-06-02 DIAGNOSIS — R262 Difficulty in walking, not elsewhere classified: Secondary | ICD-10-CM | POA: Insufficient documentation

## 2020-06-02 NOTE — Therapy (Signed)
[place] MAIN [facility] SERVICES [address] [place], [place], [address] Phone: [phone]   Fax:  [phone]  Physical Therapy Treatment  Patient Details  Name: Sherry [name] MRN: [id number] Date of Birth: October 05, 1934 Referring Provider (PT): Viviana Simpler   Encounter Date: 06/02/2020   PT End of Session - 06/02/20 1208    Visit Number 61    Number of Visits 59    Date for PT Re-Evaluation 06/30/20    PT Start Time 8938    PT Stop Time 1220    PT Time Calculation (min) 35 min    Equipment Utilized During Treatment Gait belt    Activity Tolerance Patient tolerated treatment well;Patient limited by fatigue    Behavior During Therapy [facility] for tasks assessed/performed           Past Medical History:  Diagnosis Date  . Acute blood loss anemia   . Arthritis   . Cancer ([place])    skin  . Central cord syndrome at C6 level of cervical spinal cord ([place]) 11/29/2017  . Hypertension   . Protein-calorie malnutrition, severe ([place]) 01/24/2018  . S/P insertion of IVC (inferior vena caval) filter 01/24/2018  . Tetraparesis [facility])     Past Surgical History:  Procedure Laterality Date  . ANTERIOR CERVICAL DECOMP/DISCECTOMY FUSION N/A 11/29/2017   Procedure: Cervical five-six, six-seven Anterior Cervical Decompression Fusion;  Surgeon: Judith Part, MD;  Location: [place];  Service: Neurosurgery;  Laterality: N/A;  Cervical five-six, six-seven Anterior Cervical Decompression Fusion  . CATARACT EXTRACTION    . IR IVC FILTER PLMT / S&I /IMG GUID/MOD SED  12/08/2017    There were no vitals filed for this visit.   Subjective Assessment - 06/02/20 1206    Subjective Patient reports having a good quiet weekend and no pain or falls since last visit.    Currently in Pain? No/denies            Interventions:   Standing trials: 1) 30 sec- Unable to maintain balance and reported increased left knee pain  2) 2 min 10  sec - Mod A -improved  ability to center herself with less cues and no further reported left knee pain 3) 2 min 50   sec-  4)  1 min 20 sec-dynamic weight shift lateral- throughout standing without knee buckling til the last few seconds.   Patient requires verbaltactile cues for all standing including maintaining posture, Knee and hip ext.   Clinical Impression: Patient able to continue to maintain some consistency with standing trials with improving avg time up on her feet and even able to dynamically weight shift today for 1 min. Patient will benefit from continued PT services to increase mobility and strength to improve patient's quality of life.                                             PT Education - 06/02/20 1207    Education Details postural cues with sitting and standing    Person(s) Educated Patient    Methods Explanation;Demonstration;Tactile cues;Verbal cues    Comprehension Verbalized understanding;Tactile cues required;Returned demonstration;Need further instruction;Verbal cues required            PT Short Term Goals - 03/10/20 2211      PT SHORT TERM GOAL #1   Title The patient will perform initial HEP with  minimum assistance in order to improve strength and function.    Baseline Patient demonstrating independence with initial HEP as of 03/10/2020 with no questions or difficulty.    Time 4    Period Weeks    Status Achieved    Target Date 02/11/20             PT Long Term Goals - 05/28/20 1408      PT LONG TERM GOAL #1   Title The patient will be compliant with finalized HEP with minimum assistance in preparation for self management and maintenance of condition.    Baseline 03/10/2020- Patient familiar with initial HEP and will keep goal active as HEP becomes more progressive including possible transfers and standing activities. 04/07/2020- Patient continues to report compliance with LE strengthening home exercises without questions or concerns.  04/21/2020- Patient able to verbalize and demonstrate good understanding of current HEP including some supine and seated LE strengthening exercises. Reviewed and patient performed 10 reps today with minimal cueing. Will keep goal active to progress HEP as appropriate. 05/28/2020- patient reports continues to perform LE strengthening as able and some help from caregiver as able. No changes at this time to home program as patient is limited to supine/seated therex.    Time 12    Period Weeks    Status Achieved      PT LONG TERM GOAL #2   Title The patient will demonstrate at least 10 point improvement on FOTO score indicating an improved ability to perform functional activities.    Baseline on eval (/720) score 12; 10/22/19: 12; 12/10/19: 18, 12/13: 18: 04/07/2020- Will assess next visit.    Time 12    Period Weeks    Status On-going    Target Date 06/30/20      PT LONG TERM GOAL #3   Title The patient will demonstrate lateral scooting  for assistance with transfers with minA to maximize independence.    Baseline 10/22/19: Pt requires modA+1 for lateral scooting; 12/10/19: modA+1, 12/13: modA-maxA, 12/27: modA-maxA, 03/10/2020- ModA-MaxA- increased verbal cues and visual demonstration. 04/07/2020- Continued Max assist to perform forward/lateral scoot. 04/21/2020- Patient was able to demo slight lateral scoot with CGA approx 12 in then fatigued requiring max assist- she is able to scoot forward by leaning without significant issues. 05/28/2020- While sitting in chair- patient able to perform forward and lateral scoot with VC/visual demo and increased time allowed- CGA today but not consistent yet- will keep goal active at this time to continue to focus on strengthening and improving technique.    Time 12    Period Weeks    Status On-going    Target Date 06/30/20      PT LONG TERM GOAL #4   Title The patient will demonstrate a squat pivot transfer with minimum assistance to maximize independence and mobility.     Baseline 10/22/19: unable to safely attempt at this time; 12/10/19: unable to safely attempt at this time, 01/14/20: unable to safety attempt at this time. 03/10/2020- Patient able to perform Stand pivot transfer with Maximal assist. 04/07/2020- Patient continues to require Max assist with sit to stand- will assist SPT next visit. 04/21/2020- Patient presents with Max assist to perform Sit to stand Transfer- unable to pivot or move Left LE well without difficulty.    Time 12    Period Weeks    Status On-going      PT LONG TERM GOAL #5   Title The patient will demonstrate sitting without UE support  for 2-5 minutes at EOB to improve participation and maximize independence with ADLs.    Baseline 10/22/19: Pt able ot sit unsupported for at least 2 minutes at edge of bed. 04/21/2020- Patient continues to demo good sitting at edge of mat > 5 miin without difficulty or back support.    Time 12    Period Weeks    Status Achieved      Additional Long Term Goals   Additional Long Term Goals Yes      PT LONG TERM GOAL #6   Title Patient will demonstrate improved functional LE strength as seen by consistent ability to stand > 2 min (3 out of 4 trials) with Mod/Max A for improved LE strength with transfers.    Baseline 05/28/2020- Patient able to inconsistent stand 1-2 min right now in //bars with Mod/Max A.    Time 4    Period Weeks    Status New    Target Date 06/30/20                 Plan - 06/02/20 1213    Clinical Impression Statement Patient able to continue to maintain some consistency with standing trials with improving avg time up on her feet and even able to dynamically weight shift today for 1 min. Patient will benefit from continued PT services to increase mobility and strength to improve patient's quality of life.    Personal Factors and Comorbidities Age;Time since onset of injury/illness/exacerbation;Comorbidity 3+;Fitness    Comorbidities HTN, quadriparesis, history of DVT,  neurogenic bladder    Examination-Activity Limitations Bathing;Hygiene/Grooming;Squat;Bed Mobility;Lift;Bend;Stand;Engineer, manufacturing;Toileting;Self Feeding;Transfers;Continence;Sit;Dressing;Sleep;Carry    Examination-Participation Restrictions Church;Laundry;Cleaning;Medication Management;Community Activity;Meal Prep;Interpersonal Relationship    Stability/Clinical Decision Making Evolving/Moderate complexity    Rehab Potential Fair    PT Frequency 2x / week    PT Duration 12 weeks    PT Treatment/Interventions ADLs/Self Care Home Management;Electrical Stimulation;Therapeutic activities;Wheelchair mobility training;Vasopneumatic Device;Joint Manipulations;Vestibular;Passive range of motion;Patient/family education;Therapeutic exercise;DME Instruction;Biofeedback;Aquatic Therapy;Moist Heat;Gait training;Balance training;Orthotic Fit/Training;Dry needling;Energy conservation;Taping;Splinting;Neuromuscular re-education;Cryotherapy;Ultrasound;Functional mobility training    PT Next Visit Plan Continue with progressive Standing/transfer training.    PT Home Exercise Plan no changes    Consulted and Agree with Plan of Care Patient           Patient will benefit from skilled therapeutic intervention in order to improve the following deficits and impairments:  Abnormal gait,Decreased balance,Decreased endurance,Decreased mobility,Difficulty walking,Hypomobility,Impaired sensation,Decreased range of motion,Improper body mechanics,Impaired perceived functional ability,Decreased activity tolerance,Decreased knowledge of use of DME,Decreased safety awareness,Decreased strength,Impaired flexibility,Impaired UE functional use,Postural dysfunction  Visit Diagnosis: Abnormality of gait and mobility  Difficulty in walking, not elsewhere classified  Muscle weakness (generalized)  Other lack of coordination     Problem List Patient Active Problem List   Diagnosis Date Noted  . Acute  appendicitis with appendiceal abscess 05/30/2018  . Hypocalcemia 02/15/2018  . Dysuria 02/15/2018  . Bilateral lower extremity edema 02/11/2018  . Vaginal yeast infection 01/30/2018  . Chest tightness 01/27/2018  . Healthcare-associated pneumonia 01/25/2018  . Pleural effusion, not elsewhere classified 01/25/2018  . Acute deep vein thrombosis (DVT) of left lower extremity ([place]) 01/24/2018  . Acute deep vein thrombosis (DVT) of right lower extremity ([place]) 01/24/2018  . Chronic allergic rhinitis 01/24/2018  . Depression with anxiety 01/24/2018  . UTI due to Klebsiella species 01/24/2018  . Protein-calorie malnutrition, severe ([place]) 01/24/2018  . S/P insertion of IVC (inferior vena caval) filter 01/24/2018  . Reactive depression   . Hypokalemia   . Leukocytosis   .  Essential hypertension   . Trauma   . Tetraparesis ([place])   . Neuropathic pain   . Neurogenic bowel   . Neurogenic bladder   . Benign essential HTN   . Acute blood loss anemia   . Central cord syndrome at C6 level of cervical spinal cord ([place]) 11/29/2017  . Central cord syndrome [facility]) 11/29/2017    Lewis Moccasin, PT 06/03/2020, 7:05 AM  [place] MAIN [facility] SERVICES [address] [place], [place], [address] Phone: [phone]   Fax:  [phone]  Name: JAKYIA GACCIONE MRN: [id number] Date of Birth: Oct 09, 1934

## 2020-06-02 NOTE — Therapy (Signed)
[place] MAIN [facility] SERVICES [address] [place], [place], [address] Phone: [phone]   Fax:  [phone]  Occupational Therapy Treatment  Patient Details  Name: Sherry [name] MRN: [id number] Date of Birth: [birth year] No data recorded  Encounter Date: 06/02/2020   OT End of Session - 06/02/20 1111    Visit Number 36    Number of Visits 41    Date for OT Re-Evaluation 07/30/20    Authorization Time Period Progress report period starting 05/07/2020    OT Start Time 1100    OT Stop Time 1145    OT Time Calculation (min) 45 min    Equipment Utilized During Treatment moist heat    Activity Tolerance Patient tolerated treatment well    Behavior During Therapy [facility] for tasks assessed/performed           Past Medical History:  Diagnosis Date  . Acute blood loss anemia   . Arthritis   . Cancer ([place])    skin  . Central cord syndrome at C6 level of cervical spinal cord ([place]) 11/29/2017  . Hypertension   . Protein-calorie malnutrition, severe ([place]) 01/24/2018  . S/P insertion of IVC (inferior vena caval) filter 01/24/2018  . Tetraparesis [facility])     Past Surgical History:  Procedure Laterality Date  . ANTERIOR CERVICAL DECOMP/DISCECTOMY FUSION N/A 11/29/2017   Procedure: Cervical five-six, six-seven Anterior Cervical Decompression Fusion;  Surgeon: Judith Part, MD;  Location: [place];  Service: Neurosurgery;  Laterality: N/A;  Cervical five-six, six-seven Anterior Cervical Decompression Fusion  . CATARACT EXTRACTION    . IR IVC FILTER PLMT / S&I /IMG GUID/MOD SED  12/08/2017    There were no vitals filed for this visit.   OT Treatment  Pt.tolerated bilateral scapular elevation, depression, shoulder flexion, extension, abduction, elbow flexion, extension forearm supination AAROM/AROM. PROM bilateral wrist extension, digit MP, PIP, and DIP flexion, extension, and thumb radial, and palmar abduction, and opposition in conjunction  with moist heat. Emphasis was placed on left wrist extension. Pt. worked on reaching with the bilateral UEs using the shape tower. Pt. Grasped and moved the shapes through vertical dowels of varying heights with each the left, and right hands.  Pt. presents with no pain today, and was able to tolerate ROM, moist heat modality, and functional reaching without pain.  Pt. Tolerated slow gentle stretching of bilateral radial, and palmar abduction to maintain the thumb webspace. Pt. continues to present with limited left wrist extension, and digit flexion, thumb radial, and palmar abduction. Pt.continues toprogress with BUE functional reaching. Pt. tolerated ROM well without reports of pain, or discomfort. Pt.continues to benefit from working on improving ROM in order to work towards increasing engagement of her bilateral hands during ADLs, and IADL tasks.                      OT Education - 06/02/20 1111    Education Details BUE ROM with functional reaching    Person(s) Educated Patient;Caregiver(s)    Methods Explanation;Demonstration    Comprehension Verbalized understanding;Need further instruction               OT Long Term Goals - 05/07/20 1137      OT LONG TERM GOAL #1   Title Patient and caregiver will demonstrate understanding of home exercise program for ROM.    Baseline Pt. continues to have a restorative aide rehab aide assist her wih exercises at [facility]  Time 12    Period Weeks    Status On-going    Target Date 07/30/20      OT LONG TERM GOAL #2   Title Patient will demonstrate ability to don shirt with min assist from seated position.    Baseline Pt. is indepedent with a larger shirt, and continues to require increased time to complete,    Time 12    Period Weeks    Status Achieved      OT LONG TERM GOAL #4   Baseline Pt. continues to present with improving finger flexion of R hand for a partial gross grasp, but unable to grasp and hold items.  Very limited digit flexion of L hand. Pt. has active left thumb abduction.    Time 12    Period Weeks    Status On-going    Target Date 07/30/20      OT LONG TERM GOAL #5   Title Pt will complete self feeding with setup, use of universal cuff and minimal spillage.    Baseline inconsistent with feeding and increased spillage. Pt is able to use a fork without spillage. Pt. has spillage with a spoon.    Time 12    Period Weeks    Status Achieved      OT LONG TERM GOAL #6   Title Patient will increase right UE ROM to comb the back of her hair with modified independence.    Baseline Pt. continues to be able to comb the front, and sides of her head. Pt. can reach the back, however cannot see what she is doing in the back of her head.    Time 12    Period Weeks    Status Partially Met    Target Date 07/30/20      OT LONG TERM GOAL #8   Title Pt. will write, and sign her name with 100% legibility, and modified independence.    Baseline Pt. continues to  consistently filling out her daily menu, and writing for crossword puzzles. Pt. is able to maintain grasp on a wide width pen. [place] to work on Media planner.    Time 12    Period Weeks    Status On-going    Target Date 07/30/20                 Plan - 06/02/20 1157    Clinical Impression Statement Pt. presents with no pain today, and was able to tolerate ROM, moist heat modality, and functional reaching without pain.  Pt. Tolerated slow gentle stretching of bilateral radial, and palmar abduction to maintain the thumb webspace. Pt. continues to present with limited left wrist extension, and digit flexion, thumb radial, and palmar abduction. Pt.continues toprogress with BUE functional reaching. Pt. tolerated ROM well without reports of pain, or discomfort. Pt.continues to benefit from working on improving ROM in order to work towards increasing engagement of her bilateral hands during ADLs, and IADL tasks.   OT Occupational  Profile and History Detailed Assessment- Review of Records and additional review of physical, cognitive, psychosocial history related to current functional performance    Occupational performance deficits (Please refer to evaluation for details): ADL's;IADL's;Leisure;Social Participation    Body Structure / Function / Physical Skills ADL;Continence;Dexterity;Flexibility;Strength;ROM;Balance;Coordination;FMC;IADL;Endurance;UE functional use;Decreased knowledge of use of DME;GMC    Psychosocial Skills Environmental  Adaptations;Habits;Routines and Behaviors    Rehab Potential Fair    Clinical Decision Making Limited treatment options, no task modification necessary    Comorbidities Affecting Occupational Performance:  Presence of comorbidities impacting occupational performance    Modification or Assistance to Complete Evaluation  No modification of tasks or assist necessary to complete eval    OT Frequency 2x / week    OT Duration 12 weeks    OT Treatment/Interventions Self-care/ADL training;Cryotherapy;Therapeutic exercise;DME and/or AE instruction;Balance training;Neuromuscular education;Manual Therapy;Splinting;Moist Heat;Passive range of motion;Therapeutic activities;Patient/family education    Plan continue to progress ROM of digits, wrists and shoulders as it pertains to completion of ADL tasks that pt values.    Consulted and Agree with Plan of Care Patient           Patient will benefit from skilled therapeutic intervention in order to improve the following deficits and impairments:   Body Structure / Function / Physical Skills: ADL,Continence,Dexterity,Flexibility,Strength,ROM,Balance,Coordination,FMC,IADL,Endurance,UE functional use,Decreased knowledge of use of [facility]   Psychosocial Skills: Environmental  Adaptations,Habits,Routines and Behaviors   Visit Diagnosis: Muscle weakness (generalized)  Other lack of coordination    Problem List Patient Active Problem List    Diagnosis Date Noted  . Acute appendicitis with appendiceal abscess 05/30/2018  . Hypocalcemia 02/15/2018  . Dysuria 02/15/2018  . Bilateral lower extremity edema 02/11/2018  . Vaginal yeast infection 01/30/2018  . Chest tightness 01/27/2018  . Healthcare-associated pneumonia 01/25/2018  . Pleural effusion, not elsewhere classified 01/25/2018  . Acute deep vein thrombosis (DVT) of left lower extremity ([place]) 01/24/2018  . Acute deep vein thrombosis (DVT) of right lower extremity ([place]) 01/24/2018  . Chronic allergic rhinitis 01/24/2018  . Depression with anxiety 01/24/2018  . UTI due to Klebsiella species 01/24/2018  . Protein-calorie malnutrition, severe ([place]) 01/24/2018  . S/P insertion of IVC (inferior vena caval) filter 01/24/2018  . Reactive depression   . Hypokalemia   . Leukocytosis   . Essential hypertension   . Trauma   . Tetraparesis ([place])   . Neuropathic pain   . Neurogenic bowel   . Neurogenic bladder   . Benign essential HTN   . Acute blood loss anemia   . Central cord syndrome at C6 level of cervical spinal cord ([place]) 11/29/2017  . Central cord syndrome [facility]) 11/29/2017    Harrel Carina, MS, OTR/L 06/02/2020, 12:05 PM  [place] MAIN [facility] SERVICES [address] [place], [place], [address] Phone: [phone]   Fax:  [phone]  Name: Sherry [name] MRN: [id number] Date of Birth: Mar 05, 1934

## 2020-06-04 ENCOUNTER — Encounter: Payer: Self-pay | Admitting: Occupational Therapy

## 2020-06-04 ENCOUNTER — Ambulatory Visit: Payer: Medicare HMO

## 2020-06-04 ENCOUNTER — Ambulatory Visit: Payer: Medicare HMO | Admitting: Occupational Therapy

## 2020-06-04 DIAGNOSIS — R269 Unspecified abnormalities of gait and mobility: Secondary | ICD-10-CM

## 2020-06-04 DIAGNOSIS — M6281 Muscle weakness (generalized): Secondary | ICD-10-CM

## 2020-06-04 DIAGNOSIS — R262 Difficulty in walking, not elsewhere classified: Secondary | ICD-10-CM

## 2020-06-04 DIAGNOSIS — R278 Other lack of coordination: Secondary | ICD-10-CM

## 2020-06-04 NOTE — Therapy (Signed)
[place] MAIN [facility] SERVICES [address] [place], [place], [address] Phone: [phone]   Fax:  [phone]  Physical Therapy Treatment  Patient Details  Name: Sherry [name] MRN: [id number] Date of Birth: [birth year] Referring Provider (PT): Viviana Simpler   Encounter Date: 06/04/2020   PT End of Session - 06/04/20 1153    Visit Number 48    Number of Visits 79    Date for PT Re-Evaluation 06/30/20    PT Start Time [id number]    PT Stop Time 1229    PT Time Calculation (min) 44 min    Equipment Utilized During Treatment Gait belt    Activity Tolerance Patient tolerated treatment well;Patient limited by fatigue    Behavior During Therapy [facility] for tasks assessed/performed           Past Medical History:  Diagnosis Date  . Acute blood loss anemia   . Arthritis   . Cancer ([place])    skin  . Central cord syndrome at C6 level of cervical spinal cord ([place]) 11/29/2017  . Hypertension   . Protein-calorie malnutrition, severe ([place]) 01/24/2018  . S/P insertion of IVC (inferior vena caval) filter 01/24/2018  . Tetraparesis [facility])     Past Surgical History:  Procedure Laterality Date  . ANTERIOR CERVICAL DECOMP/DISCECTOMY FUSION N/A 11/29/2017   Procedure: Cervical five-six, six-seven Anterior Cervical Decompression Fusion;  Surgeon: Judith Part, MD;  Location: [place];  Service: Neurosurgery;  Laterality: N/A;  Cervical five-six, six-seven Anterior Cervical Decompression Fusion  . CATARACT EXTRACTION    . IR IVC FILTER PLMT / S&I /IMG GUID/MOD SED  12/08/2017    There were no vitals filed for this visit.   Subjective Assessment - 06/04/20 1150    Subjective Patient reports no significant issues today and no pain.    Currently in Pain? No/denies           Interventions:    Seated hip march and knee ext - AROM on left and AAROM x 20 reps each (less AAROM today vs. Previous visits)    Standing trials: 1) 20 sec- Bilateral knee  buckling yet patient able to correct with only min assist yet unable to feel comfortable with standing.  2)  3 min 25  sec - Mod A - for trunk ext and no blocking of knees 3) 2 min 35   sec-  Same as #2 4)  1 min 50 sec-dynamic weight shift lateral- throughout standing without knee buckling til the last few seconds.   Patient requires verbaltactile cues for all standing including maintaining posture, Knee and hip ext.   Clinical Impression: Patient performed beginner therex today with less overall assistance demo more independence with Right hip march through a partial ROM as well as knee ext. She also continues to improve with her overall standing functional strength and endurance as seen by increased standing times. She experienced her best time in standing to date  as well.  Patient will benefit from continued PT services to increase mobility and strength to improve patient's quality of life.                           PT Education - 06/05/20 0707    Education Details Specific postural cues with standing    Person(s) Educated Patient    Methods Explanation;Demonstration;Tactile cues;Verbal cues    Comprehension Verbalized understanding;Returned demonstration;Need further instruction;Verbal cues required;Tactile cues required  PT Short Term Goals - 03/10/20 2211      PT SHORT TERM GOAL #1   Title The patient will perform initial HEP with minimum assistance in order to improve strength and function.    Baseline Patient demonstrating independence with initial HEP as of 03/10/2020 with no questions or difficulty.    Time 4    Period Weeks    Status Achieved    Target Date 02/11/20             PT Long Term Goals - 05/28/20 1408      PT LONG TERM GOAL #1   Title The patient will be compliant with finalized HEP with minimum assistance in preparation for self management and maintenance of condition.    Baseline 03/10/2020- Patient familiar with initial  HEP and will keep goal active as HEP becomes more progressive including possible transfers and standing activities. 04/07/2020- Patient continues to report compliance with LE strengthening home exercises without questions or concerns. 04/21/2020- Patient able to verbalize and demonstrate good understanding of current HEP including some supine and seated LE strengthening exercises. Reviewed and patient performed 10 reps today with minimal cueing. Will keep goal active to progress HEP as appropriate. 05/28/2020- patient reports continues to perform LE strengthening as able and some help from caregiver as able. No changes at this time to home program as patient is limited to supine/seated therex.    Time 12    Period Weeks    Status Achieved      PT LONG TERM GOAL #2   Title The patient will demonstrate at least 10 point improvement on FOTO score indicating an improved ability to perform functional activities.    Baseline on eval (/720) score 12; 10/22/19: 12; 12/10/19: 18, 12/13: 18: 04/07/2020- Will assess next visit.    Time 12    Period Weeks    Status On-going    Target Date 06/30/20      PT LONG TERM GOAL #3   Title The patient will demonstrate lateral scooting  for assistance with transfers with minA to maximize independence.    Baseline 10/22/19: Pt requires modA+1 for lateral scooting; 12/10/19: modA+1, 12/13: modA-maxA, 12/27: modA-maxA, 03/10/2020- ModA-MaxA- increased verbal cues and visual demonstration. 04/07/2020- Continued Max assist to perform forward/lateral scoot. 04/21/2020- Patient was able to demo slight lateral scoot with CGA approx 12 in then fatigued requiring max assist- she is able to scoot forward by leaning without significant issues. 05/28/2020- While sitting in chair- patient able to perform forward and lateral scoot with VC/visual demo and increased time allowed- CGA today but not consistent yet- will keep goal active at this time to continue to focus on strengthening and improving  technique.    Time 12    Period Weeks    Status On-going    Target Date 06/30/20      PT LONG TERM GOAL #4   Title The patient will demonstrate a squat pivot transfer with minimum assistance to maximize independence and mobility.    Baseline 10/22/19: unable to safely attempt at this time; 12/10/19: unable to safely attempt at this time, 01/14/20: unable to safety attempt at this time. 03/10/2020- Patient able to perform Stand pivot transfer with Maximal assist. 04/07/2020- Patient continues to require Max assist with sit to stand- will assist SPT next visit. 04/21/2020- Patient presents with Max assist to perform Sit to stand Transfer- unable to pivot or move Left LE well without difficulty.    Time 12    Period  Weeks    Status On-going      PT LONG TERM GOAL #5   Title The patient will demonstrate sitting without UE support for 2-5 minutes at EOB to improve participation and maximize independence with ADLs.    Baseline 10/22/19: Pt able ot sit unsupported for at least 2 minutes at edge of bed. 04/21/2020- Patient continues to demo good sitting at edge of mat > 5 miin without difficulty or back support.    Time 12    Period Weeks    Status Achieved      Additional Long Term Goals   Additional Long Term Goals Yes      PT LONG TERM GOAL #6   Title Patient will demonstrate improved functional LE strength as seen by consistent ability to stand > 2 min (3 out of 4 trials) with Mod/Max A for improved LE strength with transfers.    Baseline 05/28/2020- Patient able to inconsistent stand 1-2 min right now in //bars with Mod/Max A.    Time 4    Period Weeks    Status New    Target Date 06/30/20                 Plan - 06/04/20 1159    Clinical Impression Statement Patient performed beginner therex today with less overall assistance demo more independence with Right hip march through a partial ROM as well as knee ext. She also continues to improve with her overall standing functional strength  and endurance as seen by increased standing times. She experienced her best time in standing to date  as well.  Patient will benefit from continued PT services to increase mobility and strength to improve patient's quality of life.    Personal Factors and Comorbidities Age;Time since onset of injury/illness/exacerbation;Comorbidity 3+;Fitness    Comorbidities HTN, quadriparesis, history of DVT, neurogenic bladder    Examination-Activity Limitations Bathing;Hygiene/Grooming;Squat;Bed Mobility;Lift;Bend;Stand;Engineer, manufacturing;Toileting;Self Feeding;Transfers;Continence;Sit;Dressing;Sleep;Carry    Examination-Participation Restrictions Church;Laundry;Cleaning;Medication Management;Community Activity;Meal Prep;Interpersonal Relationship    Stability/Clinical Decision Making Evolving/Moderate complexity    Rehab Potential Fair    PT Frequency 2x / week    PT Duration 12 weeks    PT Treatment/Interventions ADLs/Self Care Home Management;Electrical Stimulation;Therapeutic activities;Wheelchair mobility training;Vasopneumatic Device;Joint Manipulations;Vestibular;Passive range of motion;Patient/family education;Therapeutic exercise;DME Instruction;Biofeedback;Aquatic Therapy;Moist Heat;Gait training;Balance training;Orthotic Fit/Training;Dry needling;Energy conservation;Taping;Splinting;Neuromuscular re-education;Cryotherapy;Ultrasound;Functional mobility training    PT Next Visit Plan Continue with progressive Standing/transfer training.    PT Home Exercise Plan no changes    Consulted and Agree with Plan of Care Patient           Patient will benefit from skilled therapeutic intervention in order to improve the following deficits and impairments:  Abnormal gait,Decreased balance,Decreased endurance,Decreased mobility,Difficulty walking,Hypomobility,Impaired sensation,Decreased range of motion,Improper body mechanics,Impaired perceived functional ability,Decreased activity  tolerance,Decreased knowledge of use of DME,Decreased safety awareness,Decreased strength,Impaired flexibility,Impaired UE functional use,Postural dysfunction  Visit Diagnosis: Abnormality of gait and mobility  Difficulty in walking, not elsewhere classified  Muscle weakness (generalized)  Other lack of coordination     Problem List Patient Active Problem List   Diagnosis Date Noted  . Acute appendicitis with appendiceal abscess 05/30/2018  . Hypocalcemia 02/15/2018  . Dysuria 02/15/2018  . Bilateral lower extremity edema 02/11/2018  . Vaginal yeast infection 01/30/2018  . Chest tightness 01/27/2018  . Healthcare-associated pneumonia 01/25/2018  . Pleural effusion, not elsewhere classified 01/25/2018  . Acute deep vein thrombosis (DVT) of left lower extremity ([place]) 01/24/2018  . Acute deep vein thrombosis (DVT) of right lower extremity ([place])  01/24/2018  . Chronic allergic rhinitis 01/24/2018  . Depression with anxiety 01/24/2018  . UTI due to Klebsiella species 01/24/2018  . Protein-calorie malnutrition, severe ([place]) 01/24/2018  . S/P insertion of IVC (inferior vena caval) filter 01/24/2018  . Reactive depression   . Hypokalemia   . Leukocytosis   . Essential hypertension   . Trauma   . Tetraparesis ([place])   . Neuropathic pain   . Neurogenic bowel   . Neurogenic bladder   . Benign essential HTN   . Acute blood loss anemia   . Central cord syndrome at C6 level of cervical spinal cord ([place]) 11/29/2017  . Central cord syndrome [facility]) 11/29/2017    Lewis Moccasin, PT 06/05/2020, 7:13 AM  [place] MAIN [facility] SERVICES [address] [place], [place], [address] Phone: [phone]   Fax:  [phone]  Name: Sherry [name] MRN: [id number] Date of Birth: Jul 03, 1934

## 2020-06-04 NOTE — Therapy (Signed)
[place] MAIN [facility] SERVICES [address] [place], [place], [address] Phone: [phone]   Fax:  [phone]  Occupational Therapy Treatment  Patient Details  Name: Sherry [name] MRN: [id number] Date of Birth: September 05, 1934 No data recorded  Encounter Date: 06/04/2020   OT End of Session - 06/04/20 1110    Visit Number 37    Number of Visits 103    Date for OT Re-Evaluation 07/30/20    Authorization Time Period Progress report period starting 05/07/2020    OT Start Time 1107    OT Stop Time 1145    OT Time Calculation (min) 38 min    Activity Tolerance Patient tolerated treatment well    Behavior During Therapy [facility] for tasks assessed/performed           Past Medical History:  Diagnosis Date  . Acute blood loss anemia   . Arthritis   . Cancer ([place])    skin  . Central cord syndrome at C6 level of cervical spinal cord ([place]) 11/29/2017  . Hypertension   . Protein-calorie malnutrition, severe ([place]) 01/24/2018  . S/P insertion of IVC (inferior vena caval) filter 01/24/2018  . Tetraparesis [facility])     Past Surgical History:  Procedure Laterality Date  . ANTERIOR CERVICAL DECOMP/DISCECTOMY FUSION N/A 11/29/2017   Procedure: Cervical five-six, six-seven Anterior Cervical Decompression Fusion;  Surgeon: Judith Part, MD;  Location: [place];  Service: Neurosurgery;  Laterality: N/A;  Cervical five-six, six-seven Anterior Cervical Decompression Fusion  . CATARACT EXTRACTION    . IR IVC FILTER PLMT / S&I /IMG GUID/MOD SED  12/08/2017    There were no vitals filed for this visit.   Subjective Assessment - 06/04/20 1110    Subjective  Pt. reports that she is doing well today.    Patient is accompanied by: Family member    Pertinent History Patient s/p fall November 29, 2017 resulting in diagnosis of central cord syndrome at C 6 level.  She has had therapy in multiple venues but with recent move to [facility], therapy staff recommended she  seek outpatient therapy for her needs.    Currently in Pain? No/denies          OT Treatment  Pt.tolerated bilateral scapular elevation, depression, shoulder flexion, extension, abduction, elbow flexion, extension forearm supination AAROM/AROM. PROM bilateral wrist extension, digit MP, PIP, and DIP flexion, extension, and thumb radial, and palmar abduction, and oppositionin conjunction with moist heat. Emphasis was placed on left wrist extension. Pt. worked on reaching with the bilateral UEs using the shape tower. Pt. grasped and moved the shapes through vertical dowels of varying heights with each the left, and right hands.  Pt. continues to make progress overall with BUE ROM. Pt. presents with no pain today, and was able to tolerate ROM, moist heat modality, and functional reaching without pain. Pt. tolerated slow gentle stretching of bilateral radial, and palmar abduction to maintain the thumb webspace.Pt. continues to present with limited left wrist extension, and digit flexion, thumb radial, and palmar abduction. Pt.continues toprogress with BUE functional reaching. Pt. tolerated ROM well without reports of pain, or discomfort. Pt.continues to benefit from working on improving ROM in order to work towards increasing engagement of her bilateral hands during ADLs, and IADL tasks.                        OT Education - 06/04/20 1110    Education Details BUE ROM with functional  reaching    Person(s) Educated Patient;Caregiver(s)    Methods Explanation;Demonstration    Comprehension Verbalized understanding;Need further instruction               OT Long Term Goals - 05/07/20 1137      OT LONG TERM GOAL #1   Title Patient and caregiver will demonstrate understanding of home exercise program for ROM.    Baseline Pt. continues to have a restorative aide rehab aide assist her wih exercises at [facility]    Time 12    Period Weeks    Status On-going     Target Date 07/30/20      OT LONG TERM GOAL #2   Title Patient will demonstrate ability to don shirt with min assist from seated position.    Baseline Pt. is indepedent with a larger shirt, and continues to require increased time to complete,    Time 12    Period Weeks    Status Achieved      OT LONG TERM GOAL #4   Baseline Pt. continues to present with improving finger flexion of R hand for a partial gross grasp, but unable to grasp and hold items. Very limited digit flexion of L hand. Pt. has active left thumb abduction.    Time 12    Period Weeks    Status On-going    Target Date 07/30/20      OT LONG TERM GOAL #5   Title Pt will complete self feeding with setup, use of universal cuff and minimal spillage.    Baseline inconsistent with feeding and increased spillage. Pt is able to use a fork without spillage. Pt. has spillage with a spoon.    Time 12    Period Weeks    Status Achieved      OT LONG TERM GOAL #6   Title Patient will increase right UE ROM to comb the back of her hair with modified independence.    Baseline Pt. continues to be able to comb the front, and sides of her head. Pt. can reach the back, however cannot see what she is doing in the back of her head.    Time 12    Period Weeks    Status Partially Met    Target Date 07/30/20      OT LONG TERM GOAL #8   Title Pt. will write, and sign her name with 100% legibility, and modified independence.    Baseline Pt. continues to  consistently filling out her daily menu, and writing for crossword puzzles. Pt. is able to maintain grasp on a wide width pen. [place] to work on Media planner.    Time 12    Period Weeks    Status On-going    Target Date 07/30/20                 Plan - 06/04/20 1111    Clinical Impression Statement Pt. continues to make progress overall with BUE ROM. Pt. presents with no pain today, and was able to tolerate ROM, moist heat modality, and functional reaching without pain.  Pt. tolerated slow gentle stretching of bilateral radial, and palmar abduction to maintain the thumb webspace.Pt. continues to present with limited left wrist extension, and digit flexion, thumb radial, and palmar abduction. Pt.continues toprogress with BUE functional reaching. Pt. tolerated ROM well without reports of pain, or discomfort. Pt.continues to benefit from working on improving ROM in order to work towards increasing engagement of her bilateral hands during ADLs,  and IADL tasks.   OT Occupational Profile and History Detailed Assessment- Review of Records and additional review of physical, cognitive, psychosocial history related to current functional performance    Occupational performance deficits (Please refer to evaluation for details): ADL's;IADL's;Leisure;Social Participation    Body Structure / Function / Physical Skills ADL;Continence;Dexterity;Flexibility;Strength;ROM;Balance;Coordination;FMC;IADL;Endurance;UE functional use;Decreased knowledge of use of DME;GMC    Psychosocial Skills Environmental  Adaptations;Habits;Routines and Behaviors    Rehab Potential Fair    Clinical Decision Making Limited treatment options, no task modification necessary    Comorbidities Affecting Occupational Performance: Presence of comorbidities impacting occupational performance    Modification or Assistance to Complete Evaluation  No modification of tasks or assist necessary to complete eval    OT Frequency 2x / week    OT Duration 12 weeks    OT Treatment/Interventions Self-care/ADL training;Cryotherapy;Therapeutic exercise;DME and/or AE instruction;Balance training;Neuromuscular education;Manual Therapy;Splinting;Moist Heat;Passive range of motion;Therapeutic activities;Patient/family education    Plan continue to progress ROM of digits, wrists and shoulders as it pertains to completion of ADL tasks that pt values.    Consulted and Agree with Plan of Care Patient           Patient will  benefit from skilled therapeutic intervention in order to improve the following deficits and impairments:   Body Structure / Function / Physical Skills: ADL,Continence,Dexterity,Flexibility,Strength,ROM,Balance,Coordination,FMC,IADL,Endurance,UE functional use,Decreased knowledge of use of [facility]   Psychosocial Skills: Environmental  Adaptations,Habits,Routines and Behaviors   Visit Diagnosis: Muscle weakness (generalized)  Other lack of coordination    Problem List Patient Active Problem List   Diagnosis Date Noted  . Acute appendicitis with appendiceal abscess 05/30/2018  . Hypocalcemia 02/15/2018  . Dysuria 02/15/2018  . Bilateral lower extremity edema 02/11/2018  . Vaginal yeast infection 01/30/2018  . Chest tightness 01/27/2018  . Healthcare-associated pneumonia 01/25/2018  . Pleural effusion, not elsewhere classified 01/25/2018  . Acute deep vein thrombosis (DVT) of left lower extremity ([place]) 01/24/2018  . Acute deep vein thrombosis (DVT) of right lower extremity ([place]) 01/24/2018  . Chronic allergic rhinitis 01/24/2018  . Depression with anxiety 01/24/2018  . UTI due to Klebsiella species 01/24/2018  . Protein-calorie malnutrition, severe ([place]) 01/24/2018  . S/P insertion of IVC (inferior vena caval) filter 01/24/2018  . Reactive depression   . Hypokalemia   . Leukocytosis   . Essential hypertension   . Trauma   . Tetraparesis ([place])   . Neuropathic pain   . Neurogenic bowel   . Neurogenic bladder   . Benign essential HTN   . Acute blood loss anemia   . Central cord syndrome at C6 level of cervical spinal cord ([place]) 11/29/2017  . Central cord syndrome [facility]) 11/29/2017    Harrel Carina, MS, OTR/L 06/04/2020, 2:50 PM  [place] MAIN [facility] SERVICES [address] [place], [place], [address] Phone: [phone]   Fax:  [phone]  Name: MAELANI YARBRO MRN: [id number] Date of Birth: 03/14/1934

## 2020-06-09 ENCOUNTER — Ambulatory Visit: Payer: Medicare HMO

## 2020-06-09 ENCOUNTER — Encounter: Payer: Self-pay | Admitting: Occupational Therapy

## 2020-06-09 ENCOUNTER — Other Ambulatory Visit: Payer: Self-pay

## 2020-06-09 ENCOUNTER — Ambulatory Visit: Payer: Medicare HMO | Admitting: Occupational Therapy

## 2020-06-09 DIAGNOSIS — R262 Difficulty in walking, not elsewhere classified: Secondary | ICD-10-CM

## 2020-06-09 DIAGNOSIS — M6281 Muscle weakness (generalized): Secondary | ICD-10-CM

## 2020-06-09 DIAGNOSIS — R269 Unspecified abnormalities of gait and mobility: Secondary | ICD-10-CM

## 2020-06-09 DIAGNOSIS — R278 Other lack of coordination: Secondary | ICD-10-CM

## 2020-06-09 NOTE — Therapy (Signed)
[place] MAIN [facility] SERVICES [address] [place], [place], [address] Phone: [phone]   Fax:  [phone]  Occupational Therapy Treatment  Patient Details  Name: Sherry [name] MRN: [id number] Date of Birth: December 17, 1934 No data recorded  Encounter Date: 06/09/2020   OT End of Session - 06/09/20 1244    Visit Number 38    Number of Visits 37    Date for OT Re-Evaluation 07/30/20    Authorization Time Period Progress report period starting 05/07/2020    OT Start Time 1104    OT Stop Time 1145    OT Time Calculation (min) 41 min    Activity Tolerance Patient tolerated treatment well    Behavior During Therapy [facility] for tasks assessed/performed           Past Medical History:  Diagnosis Date  . Acute blood loss anemia   . Arthritis   . Cancer ([place])    skin  . Central cord syndrome at C6 level of cervical spinal cord ([place]) 11/29/2017  . Hypertension   . Protein-calorie malnutrition, severe ([place]) 01/24/2018  . S/P insertion of IVC (inferior vena caval) filter 01/24/2018  . Tetraparesis [facility])     Past Surgical History:  Procedure Laterality Date  . ANTERIOR CERVICAL DECOMP/DISCECTOMY FUSION N/A 11/29/2017   Procedure: Cervical five-six, six-seven Anterior Cervical Decompression Fusion;  Surgeon: Judith Part, MD;  Location: [place];  Service: Neurosurgery;  Laterality: N/A;  Cervical five-six, six-seven Anterior Cervical Decompression Fusion  . CATARACT EXTRACTION    . IR IVC FILTER PLMT / S&I /IMG GUID/MOD SED  12/08/2017    There were no vitals filed for this visit.   Subjective Assessment - 06/09/20 1243    Subjective  Pt. reports that she is doing well today.    Patient is accompanied by: Family member    Pertinent History Patient s/p fall November 29, 2017 resulting in diagnosis of central cord syndrome at C 6 level.  She has had therapy in multiple venues but with recent move to [facility], therapy staff recommended she  seek outpatient therapy for her needs.    Patient Stated Goals Patient would like to be able to move better in bed, stand and perform self care tasks.    Currently in Pain? No/denies           OT Treatment  Pt.tolerated bilateral shoulder flexion, extension, abduction, elbow flexion, extension forearm supination AAROM/AROM. PROM bilateral wrist extension, digit MP, PIP, and DIP flexion, and extension in conjunction with moist heat.Pt. worked on bilateral UE reaching with the right and Left UE using the horizontal [employer]. Pt.worked on reaching with incorporating her core to improve trunk control while reaching up, and sliding them to the right through3horizontal rungsto the right, and 3 rungs to the left.  Pt. presents with no pain today, and was able to tolerate ROM, moist heat modality, and functional reaching without pain.Pt.was able to move the [employer] rings through 3 horizontal rungs with both the right, and left UEs today.Pt.continues to progress with her RUE, and hand whenperforming self-feeding with adpativeequipment.Pt.continues to beable to use her right thumb to hold items. Pt. tolerated ROM well without reports of pain, or discomfort. Pt.continues to benefit from working on improving ROM in order to work towards increasing engagement of her bilateral hands during ADLs, and IADL tasks  OT Education - 06/09/20 1244    Education Details BUE ROM with functional reaching    Person(s) Educated Patient;Caregiver(s)    Methods Explanation;Demonstration    Comprehension Verbalized understanding;Need further instruction               OT Long Term Goals - 05/07/20 1137      OT LONG TERM GOAL #1   Title Patient and caregiver will demonstrate understanding of home exercise program for ROM.    Baseline Pt. continues to have a restorative aide rehab aide assist her wih exercises at [facility]    Time 12    Period Weeks     Status On-going    Target Date 07/30/20      OT LONG TERM GOAL #2   Title Patient will demonstrate ability to don shirt with min assist from seated position.    Baseline Pt. is indepedent with a larger shirt, and continues to require increased time to complete,    Time 12    Period Weeks    Status Achieved      OT LONG TERM GOAL #4   Baseline Pt. continues to present with improving finger flexion of R hand for a partial gross grasp, but unable to grasp and hold items. Very limited digit flexion of L hand. Pt. has active left thumb abduction.    Time 12    Period Weeks    Status On-going    Target Date 07/30/20      OT LONG TERM GOAL #5   Title Pt will complete self feeding with setup, use of universal cuff and minimal spillage.    Baseline inconsistent with feeding and increased spillage. Pt is able to use a fork without spillage. Pt. has spillage with a spoon.    Time 12    Period Weeks    Status Achieved      OT LONG TERM GOAL #6   Title Patient will increase right UE ROM to comb the back of her hair with modified independence.    Baseline Pt. continues to be able to comb the front, and sides of her head. Pt. can reach the back, however cannot see what she is doing in the back of her head.    Time 12    Period Weeks    Status Partially Met    Target Date 07/30/20      OT LONG TERM GOAL #8   Title Pt. will write, and sign her name with 100% legibility, and modified independence.    Baseline Pt. continues to  consistently filling out her daily menu, and writing for crossword puzzles. Pt. is able to maintain grasp on a wide width pen. [place] to work on [employer].    Time 12    Period Weeks    Status On-going    Target Date 07/30/20                 Plan - 06/09/20 1244    Clinical Impression Statement Pt. presents with no pain today, and was able to tolerate ROM, moist heat modality, and functional reaching without pain.Pt.was able to move the [employer]  rings through 3 horizontal rungs with both the right, and left UEs today.Pt.continues to progress with her RUE, and hand whenperforming self-feeding with adpativeequipment.Pt.continues to beable to use her right thumb to hold items. Pt. tolerated ROM well without reports of pain, or discomfort. Pt.continues to benefit from working on improving ROM in order to work towards increasing engagement  of her bilateral hands during ADLs, and IADL tasks   OT Occupational Profile and History Detailed Assessment- Review of Records and additional review of physical, cognitive, psychosocial history related to current functional performance    Occupational performance deficits (Please refer to evaluation for details): ADL's;IADL's;Leisure;Social Participation    Body Structure / Function / Physical Skills ADL;Continence;Dexterity;Flexibility;Strength;ROM;Balance;Coordination;FMC;IADL;Endurance;UE functional use;Decreased knowledge of use of DME;GMC    Psychosocial Skills Environmental  Adaptations;Habits;Routines and Behaviors    Rehab Potential Fair    Clinical Decision Making Limited treatment options, no task modification necessary    Comorbidities Affecting Occupational Performance: Presence of comorbidities impacting occupational performance    Modification or Assistance to Complete Evaluation  No modification of tasks or assist necessary to complete eval    OT Frequency 2x / week    OT Duration 12 weeks    OT Treatment/Interventions Self-care/ADL training;Cryotherapy;Therapeutic exercise;DME and/or AE instruction;Balance training;Neuromuscular education;Manual Therapy;Splinting;Moist Heat;Passive range of motion;Therapeutic activities;Patient/family education    Consulted and Agree with Plan of Care Patient           Patient will benefit from skilled therapeutic intervention in order to improve the following deficits and impairments:   Body Structure / Function / Physical Skills:  ADL,Continence,Dexterity,Flexibility,Strength,ROM,Balance,Coordination,FMC,IADL,Endurance,UE functional use,Decreased knowledge of use of [facility]   Psychosocial Skills: Environmental  Adaptations,Habits,Routines and Behaviors   Visit Diagnosis: Muscle weakness (generalized)    Problem List Patient Active Problem List   Diagnosis Date Noted  . Acute appendicitis with appendiceal abscess 05/30/2018  . Hypocalcemia 02/15/2018  . Dysuria 02/15/2018  . Bilateral lower extremity edema 02/11/2018  . Vaginal yeast infection 01/30/2018  . Chest tightness 01/27/2018  . Healthcare-associated pneumonia 01/25/2018  . Pleural effusion, not elsewhere classified 01/25/2018  . Acute deep vein thrombosis (DVT) of left lower extremity ([place]) 01/24/2018  . Acute deep vein thrombosis (DVT) of right lower extremity ([place]) 01/24/2018  . Chronic allergic rhinitis 01/24/2018  . Depression with anxiety 01/24/2018  . UTI due to Klebsiella species 01/24/2018  . Protein-calorie malnutrition, severe ([place]) 01/24/2018  . S/P insertion of IVC (inferior vena caval) filter 01/24/2018  . Reactive depression   . Hypokalemia   . Leukocytosis   . Essential hypertension   . Trauma   . Tetraparesis ([place])   . Neuropathic pain   . Neurogenic bowel   . Neurogenic bladder   . Benign essential HTN   . Acute blood loss anemia   . Central cord syndrome at C6 level of cervical spinal cord ([place]) 11/29/2017  . Central cord syndrome [facility]) 11/29/2017    Harrel Carina 06/09/2020, 12:48 PM  [place] MAIN [facility] SERVICES [address] [place], [place], [address] Phone: [phone]   Fax:  [phone]  Name: KAWTHAR ENNEN MRN: [id number] Date of Birth: 06-25-1934

## 2020-06-09 NOTE — Therapy (Signed)
[place] MAIN [facility] SERVICES [address] [place], [place], [address] Phone: [phone]   Fax:  [phone]  Physical Therapy Treatment  Patient Details  Name: Sherry [name] MRN: [id number] Date of Birth: Dec 01, 1934 Referring Provider (PT): Viviana Simpler   Encounter Date: 06/09/2020   PT End of Session - 06/09/20 1420    Visit Number 52    Number of Visits 21    Date for PT Re-Evaluation 06/30/20    Authorization Type [employer] FOTO performed by PT on eval (7/20), score 12, Progress note on 03/10/2020    PT Start Time 1145    PT Stop Time 1220    PT Time Calculation (min) 35 min    Equipment Utilized During Treatment Gait belt    Activity Tolerance Patient tolerated treatment well;Patient limited by fatigue    Behavior During Therapy WFL for tasks assessed/performed           Past Medical History:  Diagnosis Date  . Acute blood loss anemia   . Arthritis   . Cancer ([place])    skin  . Central cord syndrome at C6 level of cervical spinal cord ([place]) 11/29/2017  . Hypertension   . Protein-calorie malnutrition, severe ([place]) 01/24/2018  . S/P insertion of IVC (inferior vena caval) filter 01/24/2018  . Tetraparesis [facility])     Past Surgical History:  Procedure Laterality Date  . ANTERIOR CERVICAL DECOMP/DISCECTOMY FUSION N/A 11/29/2017   Procedure: Cervical five-six, six-seven Anterior Cervical Decompression Fusion;  Surgeon: Judith Part, MD;  Location: [place];  Service: Neurosurgery;  Laterality: N/A;  Cervical five-six, six-seven Anterior Cervical Decompression Fusion  . CATARACT EXTRACTION    . IR IVC FILTER PLMT / S&I /IMG GUID/MOD SED  12/08/2017    There were no vitals filed for this visit.   Subjective Assessment - 06/09/20 1154    Subjective Patient reports having good Mothers day.    Currently in Pain? No/denies             Interventions:   Therapeutic Exercises:   Seated hip march (AROM with minimal  ability to raise Right knee) 2 sets of 10 reps Seated LAQ- Hold 5 sec x 10 reps each side (increased difficulty with right LE- able to achieve partial ROM) Sit up in power chair (from reclined position to upright) x 15 reps   Education provided throughout session via VC/TC and demonstration to facilitate movement at target joints and correct muscle activation for all testing and exercises performed.    Standing trials: 1) 30 sec- right knee buckling   2)  2 min 03  sec - Mod A - Difficulty attaining neutral position  3) 50 sec-  Patient unable to find her COG well and when assisted forward- she complained that the position did not feel right and requested to sit.  4)  1 min 45 sec-dynamic weight shift lateral- throughout standing without knee buckling til the last few seconds. Patient reported she was just fatigued overall today.    Patient requires verbal/tactile cues for all standing including maintaining posture, Knee and hip ext.   Clinical Impression: Patient presented with increased difficulty with standig with increased weakness noted through B LE strength and trunk extensors- unable to stand erect for position her center of gravity more over her toes. She reported just being fatigued today and was hoping for a great day. Patient is motivated yet limited today by increased fatigue and weakness. Patient will benefit from continued PT  services to increase mobility and strength to improve patient's quality of life.                           PT Education - 06/09/20 1421    Education Details Specific exercise technique    Person(s) Educated Patient    Methods Explanation;Demonstration;Tactile cues;Verbal cues    Comprehension Verbalized understanding;Returned demonstration;Verbal cues required;Tactile cues required;Need further instruction            PT Short Term Goals - 03/10/20 2211      PT SHORT TERM GOAL #1   Title The patient will perform initial HEP with  minimum assistance in order to improve strength and function.    Baseline Patient demonstrating independence with initial HEP as of 03/10/2020 with no questions or difficulty.    Time 4    Period Weeks    Status Achieved    Target Date 02/11/20             PT Long Term Goals - 05/28/20 1408      PT LONG TERM GOAL #1   Title The patient will be compliant with finalized HEP with minimum assistance in preparation for self management and maintenance of condition.    Baseline 03/10/2020- Patient familiar with initial HEP and will keep goal active as HEP becomes more progressive including possible transfers and standing activities. 04/07/2020- Patient continues to report compliance with LE strengthening home exercises without questions or concerns. 04/21/2020- Patient able to verbalize and demonstrate good understanding of current HEP including some supine and seated LE strengthening exercises. Reviewed and patient performed 10 reps today with minimal cueing. Will keep goal active to progress HEP as appropriate. 05/28/2020- patient reports continues to perform LE strengthening as able and some help from caregiver as able. No changes at this time to home program as patient is limited to supine/seated therex.    Time 12    Period Weeks    Status Achieved      PT LONG TERM GOAL #2   Title The patient will demonstrate at least 10 point improvement on FOTO score indicating an improved ability to perform functional activities.    Baseline on eval (/720) score 12; 10/22/19: 12; 12/10/19: 18, 12/13: 18: 04/07/2020- Will assess next visit.    Time 12    Period Weeks    Status On-going    Target Date 06/30/20      PT LONG TERM GOAL #3   Title The patient will demonstrate lateral scooting  for assistance with transfers with minA to maximize independence.    Baseline 10/22/19: Pt requires modA+1 for lateral scooting; 12/10/19: modA+1, 12/13: modA-maxA, 12/27: modA-maxA, 03/10/2020- ModA-MaxA- increased verbal  cues and visual demonstration. 04/07/2020- Continued Max assist to perform forward/lateral scoot. 04/21/2020- Patient was able to demo slight lateral scoot with CGA approx 12 in then fatigued requiring max assist- she is able to scoot forward by leaning without significant issues. 05/28/2020- While sitting in chair- patient able to perform forward and lateral scoot with VC/visual demo and increased time allowed- CGA today but not consistent yet- will keep goal active at this time to continue to focus on strengthening and improving technique.    Time 12    Period Weeks    Status On-going    Target Date 06/30/20      PT LONG TERM GOAL #4   Title The patient will demonstrate a squat pivot transfer with minimum assistance to maximize independence  and mobility.    Baseline 10/22/19: unable to safely attempt at this time; 12/10/19: unable to safely attempt at this time, 01/14/20: unable to safety attempt at this time. 03/10/2020- Patient able to perform Stand pivot transfer with Maximal assist. 04/07/2020- Patient continues to require Max assist with sit to stand- will assist SPT next visit. 04/21/2020- Patient presents with Max assist to perform Sit to stand Transfer- unable to pivot or move Left LE well without difficulty.    Time 12    Period Weeks    Status On-going      PT LONG TERM GOAL #5   Title The patient will demonstrate sitting without UE support for 2-5 minutes at EOB to improve participation and maximize independence with ADLs.    Baseline 10/22/19: Pt able ot sit unsupported for at least 2 minutes at edge of bed. 04/21/2020- Patient continues to demo good sitting at edge of mat > 5 miin without difficulty or back support.    Time 12    Period Weeks    Status Achieved      Additional Long Term Goals   Additional Long Term Goals Yes      PT LONG TERM GOAL #6   Title Patient will demonstrate improved functional LE strength as seen by consistent ability to stand > 2 min (3 out of 4 trials) with  Mod/Max A for improved LE strength with transfers.    Baseline 05/28/2020- Patient able to inconsistent stand 1-2 min right now in //bars with Mod/Max A.    Time 4    Period Weeks    Status New    Target Date 06/30/20                 Plan - 06/09/20 1419    Clinical Impression Statement Patient presented with increased difficulty with standig with increased weakness noted through B LE strength and trunk extensors- unable to stand erect for position her center of gravity more over her toes. She reported just being fatigued today and was hoping for a great day. Patient is motivated yet limited today by increased fatigue and weakness. Patient will benefit from continued PT services to increase mobility and strength to improve patient's quality of life.    Personal Factors and Comorbidities Age;Time since onset of injury/illness/exacerbation;Comorbidity 3+;Fitness    Comorbidities HTN, quadriparesis, history of DVT, neurogenic bladder    Examination-Activity Limitations Bathing;Hygiene/Grooming;Squat;Bed Mobility;Lift;Bend;Stand;Engineer, manufacturing;Toileting;Self Feeding;Transfers;Continence;Sit;Dressing;Sleep;Carry    Examination-Participation Restrictions Church;Laundry;Cleaning;Medication Management;Community Activity;Meal Prep;Interpersonal Relationship    Stability/Clinical Decision Making Evolving/Moderate complexity    Rehab Potential Fair    PT Frequency 2x / week    PT Duration 12 weeks    PT Treatment/Interventions ADLs/Self Care Home Management;Electrical Stimulation;Therapeutic activities;Wheelchair mobility training;Vasopneumatic Device;Joint Manipulations;Vestibular;Passive range of motion;Patient/family education;Therapeutic exercise;DME Instruction;Biofeedback;Aquatic Therapy;Moist Heat;Gait training;Balance training;Orthotic Fit/Training;Dry needling;Energy conservation;Taping;Splinting;Neuromuscular re-education;Cryotherapy;Ultrasound;Functional mobility training     PT Next Visit Plan Continue with progressive Standing/transfer training.    PT Home Exercise Plan no changes    Consulted and Agree with Plan of Care Patient           Patient will benefit from skilled therapeutic intervention in order to improve the following deficits and impairments:  Abnormal gait,Decreased balance,Decreased endurance,Decreased mobility,Difficulty walking,Hypomobility,Impaired sensation,Decreased range of motion,Improper body mechanics,Impaired perceived functional ability,Decreased activity tolerance,Decreased knowledge of use of DME,Decreased safety awareness,Decreased strength,Impaired flexibility,Impaired UE functional use,Postural dysfunction  Visit Diagnosis: Abnormality of gait and mobility  Difficulty in walking, not elsewhere classified  Muscle weakness (generalized)  Other lack of coordination  Problem List Patient Active Problem List   Diagnosis Date Noted  . Acute appendicitis with appendiceal abscess 05/30/2018  . Hypocalcemia 02/15/2018  . Dysuria 02/15/2018  . Bilateral lower extremity edema 02/11/2018  . Vaginal yeast infection 01/30/2018  . Chest tightness 01/27/2018  . Healthcare-associated pneumonia 01/25/2018  . Pleural effusion, not elsewhere classified 01/25/2018  . Acute deep vein thrombosis (DVT) of left lower extremity ([place]) 01/24/2018  . Acute deep vein thrombosis (DVT) of right lower extremity ([place]) 01/24/2018  . Chronic allergic rhinitis 01/24/2018  . Depression with anxiety 01/24/2018  . UTI due to Klebsiella species 01/24/2018  . Protein-calorie malnutrition, severe ([place]) 01/24/2018  . S/P insertion of IVC (inferior vena caval) filter 01/24/2018  . Reactive depression   . Hypokalemia   . Leukocytosis   . Essential hypertension   . Trauma   . Tetraparesis ([place])   . Neuropathic pain   . Neurogenic bowel   . Neurogenic bladder   . Benign essential HTN   . Acute blood loss anemia   . Central cord syndrome at C6  level of cervical spinal cord ([place]) 11/29/2017  . Central cord syndrome [facility]) 11/29/2017    Lewis Moccasin, PT 06/09/2020, 2:25 PM  [place] MAIN [facility] SERVICES [address] [place], [place], [address] Phone: [phone]   Fax:  [phone]  Name: Sherry [name] MRN: [id number] Date of Birth: [birth year]

## 2020-06-11 ENCOUNTER — Other Ambulatory Visit: Payer: Self-pay

## 2020-06-11 ENCOUNTER — Encounter: Payer: Self-pay | Admitting: Occupational Therapy

## 2020-06-11 ENCOUNTER — Ambulatory Visit: Payer: Medicare HMO

## 2020-06-11 ENCOUNTER — Ambulatory Visit: Payer: Medicare HMO | Admitting: Occupational Therapy

## 2020-06-11 DIAGNOSIS — R269 Unspecified abnormalities of gait and mobility: Secondary | ICD-10-CM

## 2020-06-11 DIAGNOSIS — M6281 Muscle weakness (generalized): Secondary | ICD-10-CM

## 2020-06-11 DIAGNOSIS — R278 Other lack of coordination: Secondary | ICD-10-CM

## 2020-06-11 DIAGNOSIS — R262 Difficulty in walking, not elsewhere classified: Secondary | ICD-10-CM

## 2020-06-11 NOTE — Therapy (Signed)
[place] MAIN [facility] SERVICES [address] [place], [place], [address] Phone: [phone]   Fax:  [phone]  Occupational Therapy Treatment  Patient Details  Name: Sherry [name] MRN: [id number] Date of Birth: 22-Mar-1934 No data recorded  Encounter Date: 06/11/2020   OT End of Session - 06/11/20 1111    Visit Number 39    Number of Visits 53    Date for OT Re-Evaluation 07/30/20    Authorization Time Period Progress report period starting 05/07/2020    OT Start Time 1105    OT Stop Time 1145    OT Time Calculation (min) 40 min    Activity Tolerance Patient tolerated treatment well    Behavior During Therapy [facility] for tasks assessed/performed           Past Medical History:  Diagnosis Date  . Acute blood loss anemia   . Arthritis   . Cancer ([place])    skin  . Central cord syndrome at C6 level of cervical spinal cord ([place]) 11/29/2017  . Hypertension   . Protein-calorie malnutrition, severe ([place]) 01/24/2018  . S/P insertion of IVC (inferior vena caval) filter 01/24/2018  . Tetraparesis [facility])     Past Surgical History:  Procedure Laterality Date  . ANTERIOR CERVICAL DECOMP/DISCECTOMY FUSION N/A 11/29/2017   Procedure: Cervical five-six, six-seven Anterior Cervical Decompression Fusion;  Surgeon: Judith Part, MD;  Location: [place];  Service: Neurosurgery;  Laterality: N/A;  Cervical five-six, six-seven Anterior Cervical Decompression Fusion  . CATARACT EXTRACTION    . IR IVC FILTER PLMT / S&I /IMG GUID/MOD SED  12/08/2017    There were no vitals filed for this visit.   Subjective Assessment - 06/11/20 1109    Subjective  Pt. reports that she is now sleeping well at night.    Patient is accompanied by: Family member    Pertinent History Patient s/p fall November 29, 2017 resulting in diagnosis of central cord syndrome at C 6 level.  She has had therapy in multiple venues but with recent move to [facility], therapy staff  recommended she seek outpatient therapy for her needs.    Patient Stated Goals Patient would like to be able to move better in bed, stand and perform self care tasks.    Currently in Pain? No/denies          OT Treatment  Pt.tolerated bilateral shoulder flexion, extension, abduction, elbow flexion, extension forearm supination AAROM/AROM. PROM bilateral wrist extension, digit MP, PIP, and DIP flexion, and extension in conjunction with moist heat.Pt. worked on bilateral UE reaching with the right and Left UE using the horizontal [employer]. Pt.worked on reaching with incorporating her core to improve trunk control while reaching up, and sliding them to the right through3horizontal rungsto the right, and 3 rungs to the left.  Pt. presents with no pain today, and was able to tolerate ROM, moist heat modality, and functional reaching without pain.Pt.was able to move the [place] rings through 3 horizontal rungs with both the right, and left UEstoday.Pt.continues toprogress with her RUE, andhand whenperforming self-feeding with adpativeequipment.Pt.continues to beable to use her right thumb to hold items. Pt. tolerated ROM well without reports of pain, or discomfort. Pt.continues to benefit from working on improving ROM in order to work towards increasing engagement of her bilateral hands during ADLs, and IADL tasks  OT Education - 06/11/20 1111    Education Details BUE ROM with functional reaching    Person(s) Educated Patient;Caregiver(s)    Methods Explanation;Demonstration    Comprehension Verbalized understanding;Need further instruction               OT Long Term Goals - 05/07/20 1137      OT LONG TERM GOAL #1   Title Patient and caregiver will demonstrate understanding of home exercise program for ROM.    Baseline Pt. continues to have a restorative aide rehab aide assist her wih exercises at [facility]     Time 12    Period Weeks    Status On-going    Target Date 07/30/20      OT LONG TERM GOAL #2   Title Patient will demonstrate ability to don shirt with min assist from seated position.    Baseline Pt. is indepedent with a larger shirt, and continues to require increased time to complete,    Time 12    Period Weeks    Status Achieved      OT LONG TERM GOAL #4   Baseline Pt. continues to present with improving finger flexion of R hand for a partial gross grasp, but unable to grasp and hold items. Very limited digit flexion of L hand. Pt. has active left thumb abduction.    Time 12    Period Weeks    Status On-going    Target Date 07/30/20      OT LONG TERM GOAL #5   Title Pt will complete self feeding with setup, use of universal cuff and minimal spillage.    Baseline inconsistent with feeding and increased spillage. Pt is able to use a fork without spillage. Pt. has spillage with a spoon.    Time 12    Period Weeks    Status Achieved      OT LONG TERM GOAL #6   Title Patient will increase right UE ROM to comb the back of her hair with modified independence.    Baseline Pt. continues to be able to comb the front, and sides of her head. Pt. can reach the back, however cannot see what she is doing in the back of her head.    Time 12    Period Weeks    Status Partially Met    Target Date 07/30/20      OT LONG TERM GOAL #8   Title Pt. will write, and sign her name with 100% legibility, and modified independence.    Baseline Pt. continues to  consistently filling out her daily menu, and writing for crossword puzzles. Pt. is able to maintain grasp on a wide width pen. [place] to work on [employer].    Time 12    Period Weeks    Status On-going    Target Date 07/30/20                 Plan - 06/11/20 1112    Clinical Impression Statement Pt. presents with no pain today, and was able to tolerate ROM, moist heat modality, and functional reaching without pain.Pt.was  able to move the Sa[place] rings through 3 horizontal rungs with both the right, and left UEstoday.Pt.continues toprogress with her RUE, andhand whenperforming self-feeding with adpativeequipment.Pt.continues to beable to use her right thumb to hold items. Pt. tolerated ROM well without reports of pain, or discomfort. Pt.continues to benefit from working on improving ROM in order to work towards increasing engagement of her bilateral  hands during ADLs, and IADL tasks.   OT Occupational Profile and History Detailed Assessment- Review of Records and additional review of physical, cognitive, psychosocial history related to current functional performance    Occupational performance deficits (Please refer to evaluation for details): ADL's;IADL's;Leisure;Social Participation    Body Structure / Function / Physical Skills ADL;Continence;Dexterity;Flexibility;Strength;ROM;Balance;Coordination;FMC;IADL;Endurance;UE functional use;Decreased knowledge of use of DME;GMC    Psychosocial Skills Environmental  Adaptations;Habits;Routines and Behaviors    Rehab Potential Fair    Clinical Decision Making Limited treatment options, no task modification necessary    Comorbidities Affecting Occupational Performance: Presence of comorbidities impacting occupational performance    Comorbidities impacting occupational performance description: contractures of bilateral hands, dependent transfers,    Modification or Assistance to Complete Evaluation  No modification of tasks or assist necessary to complete eval    OT Frequency 2x / week    OT Duration 12 weeks    OT Treatment/Interventions Self-care/ADL training;Cryotherapy;Therapeutic exercise;DME and/or AE instruction;Balance training;Neuromuscular education;Manual Therapy;Splinting;Moist Heat;Passive range of motion;Therapeutic activities;Patient/family education    Plan continue to progress ROM of digits, wrists and shoulders as it pertains to completion of ADL  tasks that pt values.    Consulted and Agree with Plan of Care Patient           Patient will benefit from skilled therapeutic intervention in order to improve the following deficits and impairments:   Body Structure / Function / Physical Skills: ADL,Continence,Dexterity,Flexibility,Strength,ROM,Balance,Coordination,FMC,IADL,Endurance,UE functional use,Decreased knowledge of use of [facility]   Psychosocial Skills: Environmental  Adaptations,Habits,Routines and Behaviors   Visit Diagnosis: Muscle weakness (generalized)    Problem List Patient Active Problem List   Diagnosis Date Noted  . Acute appendicitis with appendiceal abscess 05/30/2018  . Hypocalcemia 02/15/2018  . Dysuria 02/15/2018  . Bilateral lower extremity edema 02/11/2018  . Vaginal yeast infection 01/30/2018  . Chest tightness 01/27/2018  . Healthcare-associated pneumonia 01/25/2018  . Pleural effusion, not elsewhere classified 01/25/2018  . Acute deep vein thrombosis (DVT) of left lower extremity ([place]) 01/24/2018  . Acute deep vein thrombosis (DVT) of right lower extremity ([place]) 01/24/2018  . Chronic allergic rhinitis 01/24/2018  . Depression with anxiety 01/24/2018  . UTI due to Klebsiella species 01/24/2018  . Protein-calorie malnutrition, severe ([place]) 01/24/2018  . S/P insertion of IVC (inferior vena caval) filter 01/24/2018  . Reactive depression   . Hypokalemia   . Leukocytosis   . Essential hypertension   . Trauma   . Tetraparesis ([place])   . Neuropathic pain   . Neurogenic bowel   . Neurogenic bladder   . Benign essential HTN   . Acute blood loss anemia   . Central cord syndrome at C6 level of cervical spinal cord ([place]) 11/29/2017  . Central cord syndrome [facility]) 11/29/2017    Harrel Carina, MS, OTR/L 06/11/2020, 6:30 PM  [place] MAIN [facility] SERVICES [address] [place], [place], [address] Phone: [phone]   Fax:  [phone]  Name: Sherry [name] MRN: [id number] Date of Birth: [birth year]

## 2020-06-11 NOTE — Therapy (Signed)
[place] MAIN [facility] SERVICES [address] [place], [place], [address] Phone: [phone]   Fax:  [phone]  Physical Therapy Treatment  Patient Details  Name: Sherry [name] MRN: [id number] Date of Birth: [birth year] Referring Provider (PT): Viviana Simpler   Encounter Date: 06/11/2020   PT End of Session - 06/11/20 1153    Visit Number 56    Number of Visits 22    Date for PT Re-Evaluation 06/30/20    Authorization Type [employer] FOTO performed by PT on eval (7/20), score 12, Progress note on 03/10/2020    PT Start Time 1145    PT Stop Time 1225    PT Time Calculation (min) 40 min    Equipment Utilized During Treatment Gait belt    Activity Tolerance Patient tolerated treatment well;Patient limited by fatigue    Behavior During Therapy WFL for tasks assessed/performed           Past Medical History:  Diagnosis Date  . Acute blood loss anemia   . Arthritis   . Cancer ([place])    skin  . Central cord syndrome at C6 level of cervical spinal cord ([place]) 11/29/2017  . Hypertension   . Protein-calorie malnutrition, severe ([place]) 01/24/2018  . S/P insertion of IVC (inferior vena caval) filter 01/24/2018  . Tetraparesis [facility])     Past Surgical History:  Procedure Laterality Date  . ANTERIOR CERVICAL DECOMP/DISCECTOMY FUSION N/A 11/29/2017   Procedure: Cervical five-six, six-seven Anterior Cervical Decompression Fusion;  Surgeon: Judith Part, MD;  Location: [place];  Service: Neurosurgery;  Laterality: N/A;  Cervical five-six, six-seven Anterior Cervical Decompression Fusion  . CATARACT EXTRACTION    . IR IVC FILTER PLMT / S&I /IMG GUID/MOD SED  12/08/2017    There were no vitals filed for this visit.   Subjective Assessment - 06/11/20 1152    Subjective Patient reports no changes since last visit. Reports feeling okay today.    Currently in Pain? No/denies          Interventions  Standing trials: 1) 3 min 45  sec-able to obtain improved center of gravity with min assist today- mod assist with trunk today - left knee hyperextended. 2)  2 min 15  sec - Improved ability to obtain and maintain her COG with less physical assist  3) 2 min 15 sec - similar to trial #2 4)  2 min 05 sec - Patient stopped secondary to fatigue Patient requires verbal/tactile cues for all standing including maintaining posture, Knee and hip ext.    Clinical Impression: Patient presented with much improved and consistent standing with less overall physical assistance. She was able to obtain and maintain improved center of gravity and no knee buckling today.  Patient will benefit from continued PT services to increase mobility and strength to improve patient's quality of life.                              PT Short Term Goals - 03/10/20 2211      PT SHORT TERM GOAL #1   Title The patient will perform initial HEP with minimum assistance in order to improve strength and function.    Baseline Patient demonstrating independence with initial HEP as of 03/10/2020 with no questions or difficulty.    Time 4    Period Weeks    Status Achieved    Target Date 02/11/20  PT Long Term Goals - 05/28/20 1408      PT LONG TERM GOAL #1   Title The patient will be compliant with finalized HEP with minimum assistance in preparation for self management and maintenance of condition.    Baseline 03/10/2020- Patient familiar with initial HEP and will keep goal active as HEP becomes more progressive including possible transfers and standing activities. 04/07/2020- Patient continues to report compliance with LE strengthening home exercises without questions or concerns. 04/21/2020- Patient able to verbalize and demonstrate good understanding of current HEP including some supine and seated LE strengthening exercises. Reviewed and patient performed 10 reps today with minimal cueing. Will keep goal active to progress  HEP as appropriate. 05/28/2020- patient reports continues to perform LE strengthening as able and some help from caregiver as able. No changes at this time to home program as patient is limited to supine/seated therex.    Time 12    Period Weeks    Status Achieved      PT LONG TERM GOAL #2   Title The patient will demonstrate at least 10 point improvement on FOTO score indicating an improved ability to perform functional activities.    Baseline on eval (/720) score 12; 10/22/19: 12; 12/10/19: 18, 12/13: 18: 04/07/2020- Will assess next visit.    Time 12    Period Weeks    Status On-going    Target Date 06/30/20      PT LONG TERM GOAL #3   Title The patient will demonstrate lateral scooting  for assistance with transfers with minA to maximize independence.    Baseline 10/22/19: Pt requires modA+1 for lateral scooting; 12/10/19: modA+1, 12/13: modA-maxA, 12/27: modA-maxA, 03/10/2020- ModA-MaxA- increased verbal cues and visual demonstration. 04/07/2020- Continued Max assist to perform forward/lateral scoot. 04/21/2020- Patient was able to demo slight lateral scoot with CGA approx 12 in then fatigued requiring max assist- she is able to scoot forward by leaning without significant issues. 05/28/2020- While sitting in chair- patient able to perform forward and lateral scoot with VC/visual demo and increased time allowed- CGA today but not consistent yet- will keep goal active at this time to continue to focus on strengthening and improving technique.    Time 12    Period Weeks    Status On-going    Target Date 06/30/20      PT LONG TERM GOAL #4   Title The patient will demonstrate a squat pivot transfer with minimum assistance to maximize independence and mobility.    Baseline 10/22/19: unable to safely attempt at this time; 12/10/19: unable to safely attempt at this time, 01/14/20: unable to safety attempt at this time. 03/10/2020- Patient able to perform Stand pivot transfer with Maximal assist. 04/07/2020-  Patient continues to require Max assist with sit to stand- will assist SPT next visit. 04/21/2020- Patient presents with Max assist to perform Sit to stand Transfer- unable to pivot or move Left LE well without difficulty.    Time 12    Period Weeks    Status On-going      PT LONG TERM GOAL #5   Title The patient will demonstrate sitting without UE support for 2-5 minutes at EOB to improve participation and maximize independence with ADLs.    Baseline 10/22/19: Pt able ot sit unsupported for at least 2 minutes at edge of bed. 04/21/2020- Patient continues to demo good sitting at edge of mat > 5 miin without difficulty or back support.    Time 12    Period  Weeks    Status Achieved      Additional Long Term Goals   Additional Long Term Goals Yes      PT LONG TERM GOAL #6   Title Patient will demonstrate improved functional LE strength as seen by consistent ability to stand > 2 min (3 out of 4 trials) with Mod/Max A for improved LE strength with transfers.    Baseline 05/28/2020- Patient able to inconsistent stand 1-2 min right now in //bars with Mod/Max A.    Time 4    Period Weeks    Status New    Target Date 06/30/20                 Plan - 06/11/20 1154    Clinical Impression Statement Patient presented with much improved and consistent standing with less overall physical assistance. She was able to obtain and maintain improved center of gravity and no knee buckling today.  Patient will benefit from continued PT services to increase mobility and strength to improve patient's quality of life.    Personal Factors and Comorbidities Age;Time since onset of injury/illness/exacerbation;Comorbidity 3+;Fitness    Comorbidities HTN, quadriparesis, history of DVT, neurogenic bladder    Examination-Activity Limitations Bathing;Hygiene/Grooming;Squat;Bed Mobility;Lift;Bend;Stand;Engineer, manufacturing;Toileting;Self Feeding;Transfers;Continence;Sit;Dressing;Sleep;Carry     Examination-Participation Restrictions Church;Laundry;Cleaning;Medication Management;Community Activity;Meal Prep;Interpersonal Relationship    Stability/Clinical Decision Making Evolving/Moderate complexity    Rehab Potential Fair    PT Frequency 2x / week    PT Duration 12 weeks    PT Treatment/Interventions ADLs/Self Care Home Management;Electrical Stimulation;Therapeutic activities;Wheelchair mobility training;Vasopneumatic Device;Joint Manipulations;Vestibular;Passive range of motion;Patient/family education;Therapeutic exercise;DME Instruction;Biofeedback;Aquatic Therapy;Moist Heat;Gait training;Balance training;Orthotic Fit/Training;Dry needling;Energy conservation;Taping;Splinting;Neuromuscular re-education;Cryotherapy;Ultrasound;Functional mobility training    PT Next Visit Plan Continue with progressive Standing/transfer training.    PT Home Exercise Plan no changes    Consulted and Agree with Plan of Care Patient           Patient will benefit from skilled therapeutic intervention in order to improve the following deficits and impairments:  Abnormal gait,Decreased balance,Decreased endurance,Decreased mobility,Difficulty walking,Hypomobility,Impaired sensation,Decreased range of motion,Improper body mechanics,Impaired perceived functional ability,Decreased activity tolerance,Decreased knowledge of use of DME,Decreased safety awareness,Decreased strength,Impaired flexibility,Impaired UE functional use,Postural dysfunction  Visit Diagnosis: Abnormality of gait and mobility  Difficulty in walking, not elsewhere classified  Muscle weakness (generalized)  Other lack of coordination     Problem List Patient Active Problem List   Diagnosis Date Noted  . Acute appendicitis with appendiceal abscess 05/30/2018  . Hypocalcemia 02/15/2018  . Dysuria 02/15/2018  . Bilateral lower extremity edema 02/11/2018  . Vaginal yeast infection 01/30/2018  . Chest tightness 01/27/2018  .  Healthcare-associated pneumonia 01/25/2018  . Pleural effusion, not elsewhere classified 01/25/2018  . Acute deep vein thrombosis (DVT) of left lower extremity ([place]) 01/24/2018  . Acute deep vein thrombosis (DVT) of right lower extremity ([place]) 01/24/2018  . Chronic allergic rhinitis 01/24/2018  . Depression with anxiety 01/24/2018  . UTI due to Klebsiella species 01/24/2018  . Protein-calorie malnutrition, severe ([place]) 01/24/2018  . S/P insertion of IVC (inferior vena caval) filter 01/24/2018  . Reactive depression   . Hypokalemia   . Leukocytosis   . Essential hypertension   . Trauma   . Tetraparesis ([place])   . Neuropathic pain   . Neurogenic bowel   . Neurogenic bladder   . Benign essential HTN   . Acute blood loss anemia   . Central cord syndrome at C6 level of cervical spinal cord ([place]) 11/29/2017  . Central cord syndrome ([place])  11/29/2017    Lewis Moccasin, PT 06/11/2020, 1:20 PM  [place] MAIN [facility] SERVICES [address] [place], [place], [address] Phone: [phone]   Fax:  [phone]  Name: Sherry [name] MRN: [id number] Date of Birth: [birth year]

## 2020-06-13 DIAGNOSIS — G8220 Paraplegia, unspecified: Secondary | ICD-10-CM

## 2020-06-13 DIAGNOSIS — K219 Gastro-esophageal reflux disease without esophagitis: Secondary | ICD-10-CM

## 2020-06-13 DIAGNOSIS — I10 Essential (primary) hypertension: Secondary | ICD-10-CM

## 2020-06-13 DIAGNOSIS — Z91014 Allergy to mammalian meats: Secondary | ICD-10-CM

## 2020-06-13 DIAGNOSIS — I82729 Chronic embolism and thrombosis of deep veins of unspecified upper extremity: Secondary | ICD-10-CM

## 2020-06-13 DIAGNOSIS — F39 Unspecified mood [affective] disorder: Secondary | ICD-10-CM

## 2020-06-13 DIAGNOSIS — I872 Venous insufficiency (chronic) (peripheral): Secondary | ICD-10-CM

## 2020-06-13 DIAGNOSIS — M1990 Unspecified osteoarthritis, unspecified site: Secondary | ICD-10-CM

## 2020-06-16 ENCOUNTER — Ambulatory Visit: Payer: Medicare HMO

## 2020-06-16 ENCOUNTER — Ambulatory Visit: Payer: Medicare HMO | Admitting: Occupational Therapy

## 2020-06-16 ENCOUNTER — Other Ambulatory Visit: Payer: Self-pay

## 2020-06-16 ENCOUNTER — Encounter: Payer: Self-pay | Admitting: Occupational Therapy

## 2020-06-16 DIAGNOSIS — R269 Unspecified abnormalities of gait and mobility: Secondary | ICD-10-CM

## 2020-06-16 DIAGNOSIS — M6281 Muscle weakness (generalized): Secondary | ICD-10-CM

## 2020-06-16 DIAGNOSIS — R262 Difficulty in walking, not elsewhere classified: Secondary | ICD-10-CM

## 2020-06-16 NOTE — Therapy (Signed)
[place] MAIN [facility] SERVICES [address] [place], [place], [address] Phone: [phone]   Fax:  [phone]  Occupational Therapy Progress Note  Dates of reporting period  05/07/2020   to   06/16/2020  Patient Details  Name: Sherry [name] MRN: [id number] Date of Birth: 1934-12-14 No data recorded  Encounter Date: 06/16/2020   OT End of Session - 06/16/20 1104    Visit Number 40    Number of Visits 34    Date for OT Re-Evaluation 07/30/20    Authorization Time Period Progress report period starting 05/07/2020    OT Start Time 1100    OT Stop Time 1145    OT Time Calculation (min) 45 min    Activity Tolerance Patient tolerated treatment well    Behavior During Therapy [facility] for tasks assessed/performed           Past Medical History:  Diagnosis Date  . Acute blood loss anemia   . Arthritis   . Cancer ([place])    skin  . Central cord syndrome at C6 level of cervical spinal cord ([place]) 11/29/2017  . Hypertension   . Protein-calorie malnutrition, severe ([place]) 01/24/2018  . S/P insertion of IVC (inferior vena caval) filter 01/24/2018  . Tetraparesis [facility])     Past Surgical History:  Procedure Laterality Date  . ANTERIOR CERVICAL DECOMP/DISCECTOMY FUSION N/A 11/29/2017   Procedure: Cervical five-six, six-seven Anterior Cervical Decompression Fusion;  Surgeon: Judith Part, MD;  Location: [place];  Service: Neurosurgery;  Laterality: N/A;  Cervical five-six, six-seven Anterior Cervical Decompression Fusion  . CATARACT EXTRACTION    . IR IVC FILTER PLMT / S&I /IMG GUID/MOD SED  12/08/2017    There were no vitals filed for this visit.   Subjective Assessment - 06/16/20 1104    Subjective  Pt. reports that she is now sleeping well at night.    Patient is accompanied by: Family member    Pertinent History Patient s/p fall November 29, 2017 resulting in diagnosis of central cord syndrome at C 6 level.  She has had therapy in multiple  venues but with recent move to [facility], therapy staff recommended she seek outpatient therapy for her needs.    Patient Stated Goals Patient would like to be able to move better in bed, stand and perform self care tasks.    Currently in Pain? No/denies          OT Treatment  Pt.tolerated bilateral shoulder flexion, extension, abduction, elbow flexion, extension forearm supination AAROM/AROM. PROM bilateral wrist extension, digit MP, PIP, and DIP flexion, and extension in conjunction with moist heat.Pt. worked on bilateral UE reaching with the right and Left UE using the horizontal [employer]. Pt.worked on reaching with incorporating her core to improve trunk control while reaching up, and sliding them to the right through3horizontal rungsto the right, and 3 rungs to the left x's 1 rep each, and 2 horizontal rungs x's 1 rep. Pt. performed BUE strengthening with 1# dowel weight for shoulder flexion, chest press, and elbow flexion, extension.  Pt. continues to make progress overall, and is improving with BUE ROM, and functional reaching.  Pt. presents with no pain today, and was able to tolerate ROM, moist heat modality, and functional reaching without pain.Pt.was able to move the [place] rings through 3 horizontal rungs with both the right, and left UEstoday x's 1 rep each, and 2 horizontal rungs x's 1 rep. Pt. Was able to tolerate the additional of  1# dowel ex.Pt.continues toprogress with her RUE, andhand whenperforming self-feeding with adpativeequipment.Pt.continues to beable to use her right thumb to hold items. Pt. tolerated ROM well without reports of pain, or discomfort. Pt.continues to benefit from working on improving ROM in order to work towards increasing engagement of her bilateral hands during ADLs, and IADL tasks                       OT Education - 06/16/20 1104    Education Details BUE ROM with functional reaching    Person(s) Educated  Patient;Caregiver(s)    Methods Explanation;Demonstration    Comprehension Verbalized understanding;Need further instruction               OT Long Term Goals - 06/16/20 1305      OT LONG TERM GOAL #1   Title Patient and caregiver will demonstrate understanding of home exercise program for ROM.    Baseline Pt. continues to have a restorative aide rehab aide assist her wih exercises at [facility]    Time 12    Period Weeks    Status On-going    Target Date 07/30/20      OT LONG TERM GOAL #4   Title Patient will demonstrate improved composite finger flexion to hold deodorant to apply to underarms.    Baseline Pt. continues to present with improving finger flexion of R hand for a partial gross grasp, but unable to grasp and hold items. Very limited digit flexion of L hand. Pt. has active left thumb abduction.    Time 12    Period Weeks    Status On-going    Target Date 07/30/20      OT LONG TERM GOAL #6   Title Patient will increase right UE ROM to comb the back of her hair with modified independence.    Baseline Pt. continues to be able to comb the front, and sides of her head. Pt. can reach the back, however cannot see what she is doing in the back of her head.    Time 12    Period Weeks    Status Partially Met    Target Date 07/30/20      OT LONG TERM GOAL #8   Title Pt. will write, and sign her name with 100% legibility, and modified independence.    Baseline Pt. continues to  consistently filling out her daily menu, and writing for crossword puzzles. Pt. is able to maintain grasp on a wide width pen. [place] to work on Media planner.    Time 12    Period Weeks    Status On-going    Target Date 07/30/20                 Plan - 06/16/20 1105    Clinical Impression Statement Pt. continues to make progress overall, and is improving with BUE ROM, and functional reaching.  Pt. presents with no pain today, and was able to tolerate ROM, moist heat modality, and  functional reaching without pain.Pt.was able to move the [place] rings through 3 horizontal rungs with both the right, and left UEstoday x's 1 rep each, and 2 horizontal rungs x's 1 rep. Pt. Was able to tolerate the additional of 1# dowel ex.Pt.continues toprogress with her RUE, andhand whenperforming self-feeding with adpativeequipment.Pt.continues to beable to use her right thumb to hold items. Pt. tolerated ROM well without reports of pain, or discomfort. Pt.continues to benefit from working on improving ROM in order to  work towards increasing engagement of her bilateral hands during ADLs, and IADL tasks.   OT Occupational Profile and History Detailed Assessment- Review of Records and additional review of physical, cognitive, psychosocial history related to current functional performance    Occupational performance deficits (Please refer to evaluation for details): ADL's;IADL's;Leisure;Social Participation    Body Structure / Function / Physical Skills ADL;Continence;Dexterity;Flexibility;Strength;ROM;Balance;Coordination;FMC;IADL;Endurance;UE functional use;Decreased knowledge of use of DME;GMC    Psychosocial Skills Environmental  Adaptations;Habits;Routines and Behaviors    Rehab Potential Fair    Clinical Decision Making Limited treatment options, no task modification necessary    Comorbidities Affecting Occupational Performance: Presence of comorbidities impacting occupational performance    Comorbidities impacting occupational performance description: contractures of bilateral hands, dependent transfers,    Modification or Assistance to Complete Evaluation  No modification of tasks or assist necessary to complete eval    OT Frequency 2x / week    OT Duration 12 weeks    OT Treatment/Interventions Self-care/ADL training;Cryotherapy;Therapeutic exercise;DME and/or AE instruction;Balance training;Neuromuscular education;Manual Therapy;Splinting;Moist Heat;Passive range of  motion;Therapeutic activities;Patient/family education    Consulted and Agree with Plan of Care Patient           Patient will benefit from skilled therapeutic intervention in order to improve the following deficits and impairments:   Body Structure / Function / Physical Skills: ADL,Continence,Dexterity,Flexibility,Strength,ROM,Balance,Coordination,FMC,IADL,Endurance,UE functional use,Decreased knowledge of use of [facility]   Psychosocial Skills: Environmental  Adaptations,Habits,Routines and Behaviors   Visit Diagnosis: Muscle weakness (generalized)    Problem List Patient Active Problem List   Diagnosis Date Noted  . Acute appendicitis with appendiceal abscess 05/30/2018  . Hypocalcemia 02/15/2018  . Dysuria 02/15/2018  . Bilateral lower extremity edema 02/11/2018  . Vaginal yeast infection 01/30/2018  . Chest tightness 01/27/2018  . Healthcare-associated pneumonia 01/25/2018  . Pleural effusion, not elsewhere classified 01/25/2018  . Acute deep vein thrombosis (DVT) of left lower extremity ([place]) 01/24/2018  . Acute deep vein thrombosis (DVT) of right lower extremity ([place]) 01/24/2018  . Chronic allergic rhinitis 01/24/2018  . Depression with anxiety 01/24/2018  . UTI due to Klebsiella species 01/24/2018  . Protein-calorie malnutrition, severe ([place]) 01/24/2018  . S/P insertion of IVC (inferior vena caval) filter 01/24/2018  . Reactive depression   . Hypokalemia   . Leukocytosis   . Essential hypertension   . Trauma   . Tetraparesis ([place])   . Neuropathic pain   . Neurogenic bowel   . Neurogenic bladder   . Benign essential HTN   . Acute blood loss anemia   . Central cord syndrome at C6 level of cervical spinal cord ([place]) 11/29/2017  . Central cord syndrome [facility]) 11/29/2017    Harrel Carina, MS, OTR/L 06/16/2020, 1:10 PM  [place] MAIN [facility] SERVICES [address] [place], [place], [address] Phone: [phone]   Fax:   [phone]  Name: Sherry [name] MRN: [id number] Date of Birth: 06-Jun-1934

## 2020-06-16 NOTE — Therapy (Signed)
[place] MAIN [facility] SERVICES [address] [place], [place], [address] Phone: [phone]   Fax:  [phone]  Physical Therapy Treatment  Patient Details  Name: Sherry [name] MRN: [id number] Date of Birth: [birth year] Referring Provider (PT): Viviana Simpler   Encounter Date: 06/16/2020   PT End of Session - 06/16/20 1151    Visit Number 65    Number of Visits 58    Date for PT Re-Evaluation 06/30/20    Authorization Type [employer]are FOTO performed by PT on eval (7/20), score 12, Progress note on 03/10/2020    PT Start Time 1146    PT Stop Time 1220    PT Time Calculation (min) 34 min    Equipment Utilized During Treatment Gait belt    Activity Tolerance Patient tolerated treatment well;Patient limited by fatigue    Behavior During Therapy [facility] for tasks assessed/performed           Past Medical History:  Diagnosis Date  . Acute blood loss anemia   . Arthritis   . Cancer ([place])    skin  . Central cord syndrome at C6 level of cervical spinal cord ([place]) 11/29/2017  . Hypertension   . Protein-calorie malnutrition, severe ([place]) 01/24/2018  . S/P insertion of IVC (inferior vena caval) filter 01/24/2018  . Tetraparesis [facility])     Past Surgical History:  Procedure Laterality Date  . ANTERIOR CERVICAL DECOMP/DISCECTOMY FUSION N/A 11/29/2017   Procedure: Cervical five-six, six-seven Anterior Cervical Decompression Fusion;  Surgeon: Judith Part, MD;  Location: [place];  Service: Neurosurgery;  Laterality: N/A;  Cervical five-six, six-seven Anterior Cervical Decompression Fusion  . CATARACT EXTRACTION    . IR IVC FILTER PLMT / S&I /IMG GUID/MOD SED  12/08/2017    There were no vitals filed for this visit.   Subjective Assessment - 06/16/20 1150    Subjective Patient reports feeling "Blah" today but agreeable to work on what she can.    Currently in Pain? No/denies              Interventions  Initial warm up - 2sets of  10 reps  Active left knee ext and AAROM right knee   Standing trials: 1) 3 min 30 sec- Max assist to stand and min assist to maintain- patient able to maintain erect posture 2)  2 min 57  sec - initial difficulty maintaining COG- mod Assist to pull hips forward  3) 1 min 30 sec - Patient stopped secondary to fatigue * Patient reported that she was too fatigued to continue with standingl  Patient requires verbal/tactile cues for all standing including maintaining posture, Knee and hip ext.    Clinical Impression: Patient initially started off well today exhibiting decreased overall VC for posture with standing and consistent and improved time. She however fatigued [place] today and unable to continue. Patient will benefit from continued PT services to increase mobility and strength to improve patient's quality of life.                                                   PT Education - 06/16/20 1151    Education Details Standing postural education/cues.    Person(s) Educated Patient    Methods Explanation;Demonstration;Tactile cues;Verbal cues    Comprehension Verbalized understanding;Returned demonstration;Verbal cues required;Tactile cues required;Need further instruction  PT Short Term Goals - 03/10/20 2211      PT SHORT TERM GOAL #1   Title The patient will perform initial HEP with minimum assistance in order to improve strength and function.    Baseline Patient demonstrating independence with initial HEP as of 03/10/2020 with no questions or difficulty.    Time 4    Period Weeks    Status Achieved    Target Date 02/11/20             PT Long Term Goals - 05/28/20 1408      PT LONG TERM GOAL #1   Title The patient will be compliant with finalized HEP with minimum assistance in preparation for self management and maintenance of condition.    Baseline 03/10/2020- Patient familiar with initial HEP and will keep goal active as  HEP becomes more progressive including possible transfers and standing activities. 04/07/2020- Patient continues to report compliance with LE strengthening home exercises without questions or concerns. 04/21/2020- Patient able to verbalize and demonstrate good understanding of current HEP including some supine and seated LE strengthening exercises. Reviewed and patient performed 10 reps today with minimal cueing. Will keep goal active to progress HEP as appropriate. 05/28/2020- patient reports continues to perform LE strengthening as able and some help from caregiver as able. No changes at this time to home program as patient is limited to supine/seated therex.    Time 12    Period Weeks    Status Achieved      PT LONG TERM GOAL #2   Title The patient will demonstrate at least 10 point improvement on FOTO score indicating an improved ability to perform functional activities.    Baseline on eval (/720) score 12; 10/22/19: 12; 12/10/19: 18, 12/13: 18: 04/07/2020- Will assess next visit.    Time 12    Period Weeks    Status On-going    Target Date 06/30/20      PT LONG TERM GOAL #3   Title The patient will demonstrate lateral scooting  for assistance with transfers with minA to maximize independence.    Baseline 10/22/19: Pt requires modA+1 for lateral scooting; 12/10/19: modA+1, 12/13: modA-maxA, 12/27: modA-maxA, 03/10/2020- ModA-MaxA- increased verbal cues and visual demonstration. 04/07/2020- Continued Max assist to perform forward/lateral scoot. 04/21/2020- Patient was able to demo slight lateral scoot with CGA approx 12 in then fatigued requiring max assist- she is able to scoot forward by leaning without significant issues. 05/28/2020- While sitting in chair- patient able to perform forward and lateral scoot with VC/visual demo and increased time allowed- CGA today but not consistent yet- will keep goal active at this time to continue to focus on strengthening and improving technique.    Time 12    Period  Weeks    Status On-going    Target Date 06/30/20      PT LONG TERM GOAL #4   Title The patient will demonstrate a squat pivot transfer with minimum assistance to maximize independence and mobility.    Baseline 10/22/19: unable to safely attempt at this time; 12/10/19: unable to safely attempt at this time, 01/14/20: unable to safety attempt at this time. 03/10/2020- Patient able to perform Stand pivot transfer with Maximal assist. 04/07/2020- Patient continues to require Max assist with sit to stand- will assist SPT next visit. 04/21/2020- Patient presents with Max assist to perform Sit to stand Transfer- unable to pivot or move Left LE well without difficulty.    Time 12    Period  Weeks    Status On-going      PT LONG TERM GOAL #5   Title The patient will demonstrate sitting without UE support for 2-5 minutes at EOB to improve participation and maximize independence with ADLs.    Baseline 10/22/19: Pt able ot sit unsupported for at least 2 minutes at edge of bed. 04/21/2020- Patient continues to demo good sitting at edge of mat > 5 miin without difficulty or back support.    Time 12    Period Weeks    Status Achieved      Additional Long Term Goals   Additional Long Term Goals Yes      PT LONG TERM GOAL #6   Title Patient will demonstrate improved functional LE strength as seen by consistent ability to stand > 2 min (3 out of 4 trials) with Mod/Max A for improved LE strength with transfers.    Baseline 05/28/2020- Patient able to inconsistent stand 1-2 min right now in //bars with Mod/Max A.    Time 4    Period Weeks    Status New    Target Date 06/30/20                 Plan - 06/16/20 1154    Clinical Impression Statement Patient initially started off well today exhibiting decreased overall VC for posture with standing and consistent and improved time. She however fatigued [place] today and unable to continue. Patient will benefit from continued PT services to increase mobility and  strength to improve patient's quality of life.    Personal Factors and Comorbidities Age;Time since onset of injury/illness/exacerbation;Comorbidity 3+;Fitness    Comorbidities HTN, quadriparesis, history of DVT, neurogenic bladder    Examination-Activity Limitations Bathing;Hygiene/Grooming;Squat;Bed Mobility;Lift;Bend;Stand;Engineer, manufacturing;Toileting;Self Feeding;Transfers;Continence;Sit;Dressing;Sleep;Carry    Examination-Participation Restrictions Church;Laundry;Cleaning;Medication Management;Community Activity;Meal Prep;Interpersonal Relationship    Stability/Clinical Decision Making Evolving/Moderate complexity    Rehab Potential Fair    PT Frequency 2x / week    PT Duration 12 weeks    PT Treatment/Interventions ADLs/Self Care Home Management;Electrical Stimulation;Therapeutic activities;Wheelchair mobility training;Vasopneumatic Device;Joint Manipulations;Vestibular;Passive range of motion;Patient/family education;Therapeutic exercise;DME Instruction;Biofeedback;Aquatic Therapy;Moist Heat;Gait training;Balance training;Orthotic Fit/Training;Dry needling;Energy conservation;Taping;Splinting;Neuromuscular re-education;Cryotherapy;Ultrasound;Functional mobility training    PT Next Visit Plan Continue with progressive Standing/transfer training.    PT Home Exercise Plan no changes    Consulted and Agree with Plan of Care Patient           Patient will benefit from skilled therapeutic intervention in order to improve the following deficits and impairments:  Abnormal gait,Decreased balance,Decreased endurance,Decreased mobility,Difficulty walking,Hypomobility,Impaired sensation,Decreased range of motion,Improper body mechanics,Impaired perceived functional ability,Decreased activity tolerance,Decreased knowledge of use of DME,Decreased safety awareness,Decreased strength,Impaired flexibility,Impaired UE functional use,Postural dysfunction  Visit Diagnosis: Abnormality of gait  and mobility  Difficulty in walking, not elsewhere classified  Muscle weakness (generalized)     Problem List Patient Active Problem List   Diagnosis Date Noted  . Acute appendicitis with appendiceal abscess 05/30/2018  . Hypocalcemia 02/15/2018  . Dysuria 02/15/2018  . Bilateral lower extremity edema 02/11/2018  . Vaginal yeast infection 01/30/2018  . Chest tightness 01/27/2018  . Healthcare-associated pneumonia 01/25/2018  . Pleural effusion, not elsewhere classified 01/25/2018  . Acute deep vein thrombosis (DVT) of left lower extremity ([place]) 01/24/2018  . Acute deep vein thrombosis (DVT) of right lower extremity ([place]) 01/24/2018  . Chronic allergic rhinitis 01/24/2018  . Depression with anxiety 01/24/2018  . UTI due to Klebsiella species 01/24/2018  . Protein-calorie malnutrition, severe ([place]) 01/24/2018  . S/P insertion of IVC (  inferior vena caval) filter 01/24/2018  . Reactive depression   . Hypokalemia   . Leukocytosis   . Essential hypertension   . Trauma   . Tetraparesis ([place])   . Neuropathic pain   . Neurogenic bowel   . Neurogenic bladder   . Benign essential HTN   . Acute blood loss anemia   . Central cord syndrome at C6 level of cervical spinal cord ([place]) 11/29/2017  . Central cord syndrome [facility]) 11/29/2017    Lewis Moccasin, PT 06/17/2020, 10:16 AM  [place] MAIN [facility] SERVICES [address] [place], [place], [address] Phone: [phone]   Fax:  [phone]  Name: TAKEIA CIARAVINO MRN: [id number] Date of Birth: [birth year]

## 2020-06-18 ENCOUNTER — Other Ambulatory Visit: Payer: Self-pay

## 2020-06-18 ENCOUNTER — Encounter: Payer: Self-pay | Admitting: Occupational Therapy

## 2020-06-18 ENCOUNTER — Ambulatory Visit: Payer: Medicare HMO

## 2020-06-18 ENCOUNTER — Ambulatory Visit: Payer: Medicare HMO | Admitting: Occupational Therapy

## 2020-06-18 DIAGNOSIS — R262 Difficulty in walking, not elsewhere classified: Secondary | ICD-10-CM

## 2020-06-18 DIAGNOSIS — R278 Other lack of coordination: Secondary | ICD-10-CM

## 2020-06-18 DIAGNOSIS — M6281 Muscle weakness (generalized): Secondary | ICD-10-CM

## 2020-06-18 DIAGNOSIS — R269 Unspecified abnormalities of gait and mobility: Secondary | ICD-10-CM

## 2020-06-18 NOTE — Therapy (Signed)
[place] MAIN [facility] SERVICES [address] [place], [place], [address] Phone: [phone]   Fax:  [phone]  Physical Therapy Treatment  Patient Details  Name: Sherry [name] MRN: [id number] Date of Birth: Jun 21, 1934 Referring Provider (PT): Viviana Simpler   Encounter Date: 06/18/2020   PT End of Session - 06/18/20 1152    Visit Number 30    Number of Visits 60    Date for PT Re-Evaluation 06/30/20    Authorization Type a[employer]na medicare FOTO performed by PT on eval (7/20), score 12, Progress note on 03/10/2020    PT Start Time 1145    PT Stop Time 1216    PT Time Calculation (min) 31 min    Equipment Utilized During Treatment Gait belt    Activity Tolerance Patient tolerated treatment well;Patient limited by fatigue    Behavior During Therapy WFL for tasks assessed/performed           Past Medical History:  Diagnosis Date  . Acute blood loss anemia   . Arthritis   . Cancer ([place])    skin  . Central cord syndrome at C6 level of cervical spinal cord ([place]) 11/29/2017  . Hypertension   . Protein-calorie malnutrition, severe ([place]) 01/24/2018  . S/P insertion of IVC (inferior vena caval) filter 01/24/2018  . Tetraparesis [facility])     Past Surgical History:  Procedure Laterality Date  . ANTERIOR CERVICAL DECOMP/DISCECTOMY FUSION N/A 11/29/2017   Procedure: Cervical five-six, six-seven Anterior Cervical Decompression Fusion;  Surgeon: Judith Part, MD;  Location: [place];  Service: Neurosurgery;  Laterality: N/A;  Cervical five-six, six-seven Anterior Cervical Decompression Fusion  . CATARACT EXTRACTION    . IR IVC FILTER PLMT / S&I /IMG GUID/MOD SED  12/08/2017    There were no vitals filed for this visit.   Subjective Assessment - 06/18/20 1151    Subjective Patient reports feeling some better than last visit.    Currently in Pain? No/denies                 Attempted Sit to stand/static standing in //bars today- x 2  trials and the first trial- patient exhibited poor ability to stand and both feet slid out from her under requiring Max Assist to keep her from falling- able to position back to her chair safely. Attempt again with similar results so did not attempt any further standing today.   Positioned patient chair back into reclined position with Legs extended and raised and patient performed the following:  Quad sets x 5 sec hold BLE x 10 Gluteal sets x 5 sec hold- BLE x 10  Heel slides x10 BLE x 2 sets Hip abd x 10 BLE x 2 sets SLR x 10 reps BLE x 2 sets  *all with AAROM on right LE Patient requested to end session due to increased fatigue stating she was really tired.   Clinical Impression. Treatment abbreviated per patient request due to increased fatigue with attempted standing and reclined LE strengthening. She was unable to stand well at all today- Denied pain yet unable to tighten thigh for any effective standing. Patient was able to complete some supine strengthening yet again limited by fatigue. Patient will benefit from continued PT services to increase mobility and strength to improve patient's quality of life                     PT Education - 06/18/20 1252    Education Details specific exercise  cues    Person(s) Educated Patient;Child(ren)    Methods Explanation;Demonstration;Tactile cues;Verbal cues    Comprehension Verbalized understanding;Returned demonstration;Verbal cues required;Tactile cues required;Need further instruction            PT Short Term Goals - 03/10/20 2211      PT SHORT TERM GOAL #1   Title The patient will perform initial HEP with minimum assistance in order to improve strength and function.    Baseline Patient demonstrating independence with initial HEP as of 03/10/2020 with no questions or difficulty.    Time 4    Period Weeks    Status Achieved    Target Date 02/11/20             PT Long Term Goals - 05/28/20 1408      PT LONG TERM  GOAL #1   Title The patient will be compliant with finalized HEP with minimum assistance in preparation for self management and maintenance of condition.    Baseline 03/10/2020- Patient familiar with initial HEP and will keep goal active as HEP becomes more progressive including possible transfers and standing activities. 04/07/2020- Patient continues to report compliance with LE strengthening home exercises without questions or concerns. 04/21/2020- Patient able to verbalize and demonstrate good understanding of current HEP including some supine and seated LE strengthening exercises. Reviewed and patient performed 10 reps today with minimal cueing. Will keep goal active to progress HEP as appropriate. 05/28/2020- patient reports continues to perform LE strengthening as able and some help from caregiver as able. No changes at this time to home program as patient is limited to supine/seated therex.    Time 12    Period Weeks    Status Achieved      PT LONG TERM GOAL #2   Title The patient will demonstrate at least 10 point improvement on FOTO score indicating an improved ability to perform functional activities.    Baseline on eval (/720) score 12; 10/22/19: 12; 12/10/19: 18, 12/13: 18: 04/07/2020- Will assess next visit.    Time 12    Period Weeks    Status On-going    Target Date 06/30/20      PT LONG TERM GOAL #3   Title The patient will demonstrate lateral scooting  for assistance with transfers with minA to maximize independence.    Baseline 10/22/19: Pt requires modA+1 for lateral scooting; 12/10/19: modA+1, 12/13: modA-maxA, 12/27: modA-maxA, 03/10/2020- ModA-MaxA- increased verbal cues and visual demonstration. 04/07/2020- Continued Max assist to perform forward/lateral scoot. 04/21/2020- Patient was able to demo slight lateral scoot with CGA approx 12 in then fatigued requiring max assist- she is able to scoot forward by leaning without significant issues. 05/28/2020- While sitting in chair- patient  able to perform forward and lateral scoot with VC/visual demo and increased time allowed- CGA today but not consistent yet- will keep goal active at this time to continue to focus on strengthening and improving technique.    Time 12    Period Weeks    Status On-going    Target Date 06/30/20      PT LONG TERM GOAL #4   Title The patient will demonstrate a squat pivot transfer with minimum assistance to maximize independence and mobility.    Baseline 10/22/19: unable to safely attempt at this time; 12/10/19: unable to safely attempt at this time, 01/14/20: unable to safety attempt at this time. 03/10/2020- Patient able to perform Stand pivot transfer with Maximal assist. 04/07/2020- Patient continues to require Max assist with sit  to stand- will assist SPT next visit. 04/21/2020- Patient presents with Max assist to perform Sit to stand Transfer- unable to pivot or move Left LE well without difficulty.    Time 12    Period Weeks    Status On-going      PT LONG TERM GOAL #5   Title The patient will demonstrate sitting without UE support for 2-5 minutes at EOB to improve participation and maximize independence with ADLs.    Baseline 10/22/19: Pt able ot sit unsupported for at least 2 minutes at edge of bed. 04/21/2020- Patient continues to demo good sitting at edge of mat > 5 miin without difficulty or back support.    Time 12    Period Weeks    Status Achieved      Additional Long Term Goals   Additional Long Term Goals Yes      PT LONG TERM GOAL #6   Title Patient will demonstrate improved functional LE strength as seen by consistent ability to stand > 2 min (3 out of 4 trials) with Mod/Max A for improved LE strength with transfers.    Baseline 05/28/2020- Patient able to inconsistent stand 1-2 min right now in //bars with Mod/Max A.    Time 4    Period Weeks    Status New    Target Date 06/30/20                 Plan - 06/18/20 1204    Clinical Impression Statement Treatment abbreviated  per patient request due to increased fatigue with attempted standing and reclined LE strengthening. She was unable to stand well at all today- Denied pain yet unable to tighten thigh for any effective standing. Patient was able to complete some supine strengthening yet again limited by fatigue. Patient will benefit from continued PT services to increase mobility and strength to improve patient's quality of life    Personal Factors and Comorbidities Age;Time since onset of injury/illness/exacerbation;Comorbidity 3+;Fitness    Comorbidities HTN, quadriparesis, history of DVT, neurogenic bladder    Examination-Activity Limitations Bathing;Hygiene/Grooming;Squat;Bed Mobility;Lift;Bend;Stand;Engineer, manufacturing;Toileting;Self Feeding;Transfers;Continence;Sit;Dressing;Sleep;Carry    Examination-Participation Restrictions Church;Laundry;Cleaning;Medication Management;Community Activity;Meal Prep;Interpersonal Relationship    Stability/Clinical Decision Making Evolving/Moderate complexity    Rehab Potential Fair    PT Frequency 2x / week    PT Duration 12 weeks    PT Treatment/Interventions ADLs/Self Care Home Management;Electrical Stimulation;Therapeutic activities;Wheelchair mobility training;Vasopneumatic Device;Joint Manipulations;Vestibular;Passive range of motion;Patient/family education;Therapeutic exercise;DME Instruction;Biofeedback;Aquatic Therapy;Moist Heat;Gait training;Balance training;Orthotic Fit/Training;Dry needling;Energy conservation;Taping;Splinting;Neuromuscular re-education;Cryotherapy;Ultrasound;Functional mobility training    PT Next Visit Plan Continue with progressive Standing/transfer training.    PT Home Exercise Plan no changes    Consulted and Agree with Plan of Care Patient           Patient will benefit from skilled therapeutic intervention in order to improve the following deficits and impairments:  Abnormal gait,Decreased balance,Decreased endurance,Decreased  mobility,Difficulty walking,Hypomobility,Impaired sensation,Decreased range of motion,Improper body mechanics,Impaired perceived functional ability,Decreased activity tolerance,Decreased knowledge of use of DME,Decreased safety awareness,Decreased strength,Impaired flexibility,Impaired UE functional use,Postural dysfunction  Visit Diagnosis: Abnormality of gait and mobility  Difficulty in walking, not elsewhere classified  Muscle weakness (generalized)  Other lack of coordination     Problem List Patient Active Problem List   Diagnosis Date Noted  . Acute appendicitis with appendiceal abscess 05/30/2018  . Hypocalcemia 02/15/2018  . Dysuria 02/15/2018  . Bilateral lower extremity edema 02/11/2018  . Vaginal yeast infection 01/30/2018  . Chest tightness 01/27/2018  . Healthcare-associated pneumonia 01/25/2018  . Pleural  effusion, not elsewhere classified 01/25/2018  . Acute deep vein thrombosis (DVT) of left lower extremity ([place]) 01/24/2018  . Acute deep vein thrombosis (DVT) of right lower extremity ([place]) 01/24/2018  . Chronic allergic rhinitis 01/24/2018  . Depression with anxiety 01/24/2018  . UTI due to Klebsiella species 01/24/2018  . Protein-calorie malnutrition, severe ([place]) 01/24/2018  . S/P insertion of IVC (inferior vena caval) filter 01/24/2018  . Reactive depression   . Hypokalemia   . Leukocytosis   . Essential hypertension   . Trauma   . Tetraparesis ([place])   . Neuropathic pain   . Neurogenic bowel   . Neurogenic bladder   . Benign essential HTN   . Acute blood loss anemia   . Central cord syndrome at C6 level of cervical spinal cord ([place]) 11/29/2017  . Central cord syndrome [facility]) 11/29/2017    Lewis Moccasin, PT 06/18/2020, 12:53 PM  [place] MAIN [facility] SERVICES [address] [place], [place], [address] Phone: [phone]   Fax:  [phone]  Name: Sherry [name] MRN: [id number] Date of Birth:  January 16, 1935

## 2020-06-18 NOTE — Therapy (Signed)
[place] MAIN [facility] SERVICES [address] [place], [place], [address] Phone: [phone]   Fax:  [phone]  Occupational Therapy Treatment  Patient Details  Name: Sherry [name] MRN: [id number] Date of Birth: May 23, 1934 No data recorded  Encounter Date: 06/18/2020   OT End of Session - 06/18/20 1331    Visit Number 41    Number of Visits 58    Date for OT Re-Evaluation 07/30/20    Authorization Time Period Progress report period starting 05/07/2020    OT Start Time 1100    OT Stop Time 1145    OT Time Calculation (min) 45 min    Activity Tolerance Patient tolerated treatment well    Behavior During Therapy [facility] for tasks assessed/performed           Past Medical History:  Diagnosis Date  . Acute blood loss anemia   . Arthritis   . Cancer ([place])    skin  . Central cord syndrome at C6 level of cervical spinal cord ([place]) 11/29/2017  . Hypertension   . Protein-calorie malnutrition, severe ([place]) 01/24/2018  . S/P insertion of IVC (inferior vena caval) filter 01/24/2018  . Tetraparesis [facility])     Past Surgical History:  Procedure Laterality Date  . ANTERIOR CERVICAL DECOMP/DISCECTOMY FUSION N/A 11/29/2017   Procedure: Cervical five-six, six-seven Anterior Cervical Decompression Fusion;  Surgeon: Judith Part, MD;  Location: [place];  Service: Neurosurgery;  Laterality: N/A;  Cervical five-six, six-seven Anterior Cervical Decompression Fusion  . CATARACT EXTRACTION    . IR IVC FILTER PLMT / S&I /IMG GUID/MOD SED  12/08/2017    There were no vitals filed for this visit.   Subjective Assessment - 06/18/20 1331    Subjective  Pt. reports that she is now sleeping well at night.    Patient is accompanied by: Family member    Pertinent History Patient s/p fall November 29, 2017 resulting in diagnosis of central cord syndrome at C 6 level.  She has had therapy in multiple venues but with recent move to [facility], therapy staff  recommended she seek outpatient therapy for her needs.    Patient Stated Goals Patient would like to be able to move better in bed, stand and perform self care tasks.    Currently in Pain? No/denies           OT Treatment  Pt.tolerated bilateral shoulder flexion, extension, abduction, elbow flexion, extension forearm supination AAROM/AROM. PROM bilateral wrist extension, digit MP, PIP, and DIP flexion, and extension in conjunction with moist heat.Pt. worked on bilateral UE reaching with the right and Left UE using the horizontal [employer]. Pt.worked on reaching with incorporating her core to improve trunk control while reaching up, and sliding them to the right through3horizontal rungsto the right, and 3 rungs to the left x's 1 rep each.  Pt. continues to make progress overall, and is improving with BUE ROM, and functional reaching.  Pt. presents with no pain today, and was able to tolerate ROM, moist heat modality, and functional reaching without pain.Pt.was able to move the [place] rings through 3 horizontal rungs with both the right, and left UEstoday x's 1 rep each with her right, and left UE's. Pt.continues toprogress with her RUE, andhand whenperforming self-feeding with adpativeequipment.Pt.continues to beable to use her right thumb to hold items. Pt.continues to benefit from working on improving ROM in order to work towards increasing engagement of her bilateral hands during ADLs, and IADL tasks.  OT Education - 06/18/20 1331    Education Details BUE ROM with functional reaching    Person(s) Educated Patient;Caregiver(s)    Methods Explanation;Demonstration    Comprehension Verbalized understanding;Need further instruction               OT Long Term Goals - 06/16/20 1305      OT LONG TERM GOAL #1   Title Patient and caregiver will demonstrate understanding of home exercise program for ROM.    Baseline Pt.  continues to have a restorative aide rehab aide assist her wih exercises at [facility]    Time 12    Period Weeks    Status On-going    Target Date 07/30/20      OT LONG TERM GOAL #4   Title Patient will demonstrate improved composite finger flexion to hold deodorant to apply to underarms.    Baseline Pt. continues to present with improving finger flexion of R hand for a partial gross grasp, but unable to grasp and hold items. Very limited digit flexion of L hand. Pt. has active left thumb abduction.    Time 12    Period Weeks    Status On-going    Target Date 07/30/20      OT LONG TERM GOAL #6   Title Patient will increase right UE ROM to comb the back of her hair with modified independence.    Baseline Pt. continues to be able to comb the front, and sides of her head. Pt. can reach the back, however cannot see what she is doing in the back of her head.    Time 12    Period Weeks    Status Partially Met    Target Date 07/30/20      OT LONG TERM GOAL #8   Title Pt. will write, and sign her name with 100% legibility, and modified independence.    Baseline Pt. continues to  consistently filling out her daily menu, and writing for crossword puzzles. Pt. is able to maintain grasp on a wide width pen. [place] to work on Media planner.    Time 12    Period Weeks    Status On-going    Target Date 07/30/20                 Plan - 06/18/20 1332    Clinical Impression Statement Pt. continues to make progress overall, and is improving with BUE ROM, and functional reaching.  Pt. presents with no pain today, and was able to tolerate ROM, moist heat modality, and functional reaching without pain.Pt.was able to move the Sa[employer] rings through 3 horizontal rungs with both the right, and left UEstoday x's 1 rep each with her right, and left UE's. Pt.continues toprogress with her RUE, andhand whenperforming self-feeding with adpativeequipment.Pt.continues to beable to use her  right thumb to hold items. Pt.continues to benefit from working on improving ROM in order to work towards increasing engagement of her bilateral hands during ADLs, and IADL tasks.   OT Occupational Profile and History Detailed Assessment- Review of Records and additional review of physical, cognitive, psychosocial history related to current functional performance    Occupational performance deficits (Please refer to evaluation for details): ADL's;IADL's;Leisure;Social Participation    Body Structure / Function / Physical Skills ADL;Continence;Dexterity;Flexibility;Strength;ROM;Balance;Coordination;FMC;IADL;Endurance;UE functional use;Decreased knowledge of use of DME;GMC    Rehab Potential Fair    Clinical Decision Making Limited treatment options, no task modification necessary    Comorbidities Affecting Occupational Performance: Presence of comorbidities  impacting occupational performance    Comorbidities impacting occupational performance description: contractures of bilateral hands, dependent transfers,    Modification or Assistance to Complete Evaluation  No modification of tasks or assist necessary to complete eval    OT Frequency 2x / week    OT Duration 12 weeks    OT Treatment/Interventions Self-care/ADL training;Cryotherapy;Therapeutic exercise;DME and/or AE instruction;Balance training;Neuromuscular education;Manual Therapy;Splinting;Moist Heat;Passive range of motion;Therapeutic activities;Patient/family education    Plan continue to progress ROM of digits, wrists and shoulders as it pertains to completion of ADL tasks that pt values.    Consulted and Agree with Plan of Care Patient           Patient will benefit from skilled therapeutic intervention in order to improve the following deficits and impairments:   Body Structure / Function / Physical Skills: ADL,Continence,Dexterity,Flexibility,Strength,ROM,Balance,Coordination,FMC,IADL,Endurance,UE functional use,Decreased knowledge of  use of [facility]       Visit Diagnosis: Muscle weakness (generalized)    Problem List Patient Active Problem List   Diagnosis Date Noted  . Acute appendicitis with appendiceal abscess 05/30/2018  . Hypocalcemia 02/15/2018  . Dysuria 02/15/2018  . Bilateral lower extremity edema 02/11/2018  . Vaginal yeast infection 01/30/2018  . Chest tightness 01/27/2018  . Healthcare-associated pneumonia 01/25/2018  . Pleural effusion, not elsewhere classified 01/25/2018  . Acute deep vein thrombosis (DVT) of left lower extremity ([place]) 01/24/2018  . Acute deep vein thrombosis (DVT) of right lower extremity ([place]) 01/24/2018  . Chronic allergic rhinitis 01/24/2018  . Depression with anxiety 01/24/2018  . UTI due to Klebsiella species 01/24/2018  . Protein-calorie malnutrition, severe ([place]) 01/24/2018  . S/P insertion of IVC (inferior vena caval) filter 01/24/2018  . Reactive depression   . Hypokalemia   . Leukocytosis   . Essential hypertension   . Trauma   . Tetraparesis ([place])   . Neuropathic pain   . Neurogenic bowel   . Neurogenic bladder   . Benign essential HTN   . Acute blood loss anemia   . Central cord syndrome at C6 level of cervical spinal cord ([place]) 11/29/2017  . Central cord syndrome [facility]) 11/29/2017    Harrel Carina, MS, OTR/L 06/18/2020, 1:34 PM  [place] MAIN [facility] SERVICES [address] [place], [place], [address] Phone: [phone]   Fax:  [phone]  Name: Sherry [name] MRN: [id number] Date of Birth: 12-09-1934

## 2020-06-23 ENCOUNTER — Ambulatory Visit: Payer: Medicare HMO

## 2020-06-23 ENCOUNTER — Encounter: Payer: Self-pay | Admitting: Occupational Therapy

## 2020-06-23 ENCOUNTER — Ambulatory Visit: Payer: Medicare HMO | Admitting: Occupational Therapy

## 2020-06-23 ENCOUNTER — Other Ambulatory Visit: Payer: Self-pay

## 2020-06-23 DIAGNOSIS — R278 Other lack of coordination: Secondary | ICD-10-CM

## 2020-06-23 DIAGNOSIS — R269 Unspecified abnormalities of gait and mobility: Secondary | ICD-10-CM

## 2020-06-23 DIAGNOSIS — M6281 Muscle weakness (generalized): Secondary | ICD-10-CM

## 2020-06-23 DIAGNOSIS — R262 Difficulty in walking, not elsewhere classified: Secondary | ICD-10-CM

## 2020-06-23 NOTE — Therapy (Signed)
[place] MAIN [facility] SERVICES [address] [place], [place], [address] Phone: [phone]   Fax:  [phone]  Occupational Therapy Treatment  Patient Details  Name: Sherry [name] MRN: [id number] Date of Birth: 09/24/1934 No data recorded  Encounter Date: 06/23/2020   OT End of Session - 06/23/20 1110    Visit Number 42    Number of Visits 52    Date for OT Re-Evaluation 07/30/20    Authorization Time Period Progress report period starting 05/07/2020    OT Start Time 1105    OT Stop Time 1145    OT Time Calculation (min) 40 min    Equipment Utilized During Treatment moist heat    Activity Tolerance Patient tolerated treatment well    Behavior During Therapy [facility] for tasks assessed/performed           Past Medical History:  Diagnosis Date  . Acute blood loss anemia   . Arthritis   . Cancer ([place])    skin  . Central cord syndrome at C6 level of cervical spinal cord ([place]) 11/29/2017  . Hypertension   . Protein-calorie malnutrition, severe ([place]) 01/24/2018  . S/P insertion of IVC (inferior vena caval) filter 01/24/2018  . Tetraparesis [facility])     Past Surgical History:  Procedure Laterality Date  . ANTERIOR CERVICAL DECOMP/DISCECTOMY FUSION N/A 11/29/2017   Procedure: Cervical five-six, six-seven Anterior Cervical Decompression Fusion;  Surgeon: Judith Part, MD;  Location: [place];  Service: Neurosurgery;  Laterality: N/A;  Cervical five-six, six-seven Anterior Cervical Decompression Fusion  . CATARACT EXTRACTION    . IR IVC FILTER PLMT / S&I /IMG GUID/MOD SED  12/08/2017    There were no vitals filed for this visit.   Subjective Assessment - 06/23/20 1109    Subjective  Pt. reports that she is now sleeping well at night.    Patient is accompanied by: Family member    Pertinent History Patient s/p fall November 29, 2017 resulting in diagnosis of central cord syndrome at C 6 level.  She has had therapy in multiple venues but  with recent move to [facility], therapy staff recommended she seek outpatient therapy for her needs.    Currently in Pain? No/denies          OT Treatment  Pt.tolerated bilateral shoulder flexion, extension, abduction, elbow flexion, extension forearm supination AAROM/AROM. PROM bilateral wrist extension, digit MP, PIP, and DIP flexion, and extension in conjunction with moist heat.Pt. worked on bilateral UE reaching with the right and Left UE using the horizontal [employer]. Pt.worked on reaching with incorporating her core to improve trunk control while reaching up, and sliding them to the right through3horizontal rungsto the right, and 3 rungs to the leftx's 1 rep each.  Pt. reports that she received her COVID-19 booster this weekend. Pt. continues to make progress overall, and is improving with BUE ROM, and functional reaching.Pt. presents with no pain today, and was able to tolerate ROM, moist heat modality, and functional reaching without pain.Pt.was able to move the [place] rings through 3 horizontal rungs with both the right, and left UEstodayx's 1 rep each with her right, and left UE's. Pt.continues toprogress with her RUE, andhand whenperforming self-feeding with adpativeequipment.Pt.continues to beable to use her right thumb to hold items. Pt.continues to benefit from working on improving ROM in order to work towards increasing engagement of her bilateral hands during ADLs, and IADL tasks.  OT Education - 06/23/20 1110    Education Details BUE ROM with functional reaching    Person(s) Educated Patient;Caregiver(s)    Methods Explanation;Demonstration    Comprehension Verbalized understanding;Need further instruction               OT Long Term Goals - 06/16/20 1305      OT LONG TERM GOAL #1   Title Patient and caregiver will demonstrate understanding of home exercise program for ROM.    Baseline Pt. continues to  have a restorative aide rehab aide assist her wih exercises at [facility]    Time 12    Period Weeks    Status On-going    Target Date 07/30/20      OT LONG TERM GOAL #4   Title Patient will demonstrate improved composite finger flexion to hold deodorant to apply to underarms.    Baseline Pt. continues to present with improving finger flexion of R hand for a partial gross grasp, but unable to grasp and hold items. Very limited digit flexion of L hand. Pt. has active left thumb abduction.    Time 12    Period Weeks    Status On-going    Target Date 07/30/20      OT LONG TERM GOAL #6   Title Patient will increase right UE ROM to comb the back of her hair with modified independence.    Baseline Pt. continues to be able to comb the front, and sides of her head. Pt. can reach the back, however cannot see what she is doing in the back of her head.    Time 12    Period Weeks    Status Partially Met    Target Date 07/30/20      OT LONG TERM GOAL #8   Title Pt. will write, and sign her name with 100% legibility, and modified independence.    Baseline Pt. continues to  consistently filling out her daily menu, and writing for crossword puzzles. Pt. is able to maintain grasp on a wide width pen. [place] to work on Media [employer].    Time 12    Period Weeks    Status On-going    Target Date 07/30/20                 Plan - 06/23/20 1111    Clinical Impression Statement Pt. reports that she received her COVID-19 booster this weekend. Pt. continues to make progress overall, and is improving with BUE ROM, and functional reaching.Pt. presents with no pain today, and was able to tolerate ROM, moist heat modality, and functional reaching without pain.Pt.was able to move the [place] rings through 3 horizontal rungs with both the right, and left UEstodayx's 1 rep each with her right, and left UE's. Pt.continues toprogress with her RUE, andhand whenperforming self-feeding with  adpativeequipment.Pt.continues to beable to use her right thumb to hold items. Pt.continues to benefit from working on improving ROM in order to work towards increasing engagement of her bilateral hands during ADLs, and IADL tasks.   OT Occupational Profile and History Detailed Assessment- Review of Records and additional review of physical, cognitive, psychosocial history related to current functional performance    Occupational performance deficits (Please refer to evaluation for details): ADL's;IADL's;Leisure;Social Participation    Body Structure / Function / Physical Skills ADL;Continence;Dexterity;Flexibility;Strength;ROM;Balance;Coordination;FMC;IADL;Endurance;UE functional use;Decreased knowledge of use of DME;GMC    Psychosocial Skills Environmental  Adaptations;Habits;Routines and Behaviors    Rehab Potential Fair    Clinical Decision Making  Limited treatment options, no task modification necessary    Comorbidities Affecting Occupational Performance: Presence of comorbidities impacting occupational performance    Comorbidities impacting occupational performance description: contractures of bilateral hands, dependent transfers,    Modification or Assistance to Complete Evaluation  No modification of tasks or assist necessary to complete eval    OT Frequency 2x / week    OT Duration 12 weeks    OT Treatment/Interventions Self-care/ADL training;Cryotherapy;Therapeutic exercise;DME and/or AE instruction;Balance training;Neuromuscular education;Manual Therapy;Splinting;Moist Heat;Passive range of motion;Therapeutic activities;Patient/family education    Plan continue to progress ROM of digits, wrists and shoulders as it pertains to completion of ADL tasks that pt values.    Consulted and Agree with Plan of Care Patient           Patient will benefit from skilled therapeutic intervention in order to improve the following deficits and impairments:   Body Structure / Function / Physical  Skills: ADL,Continence,Dexterity,Flexibility,Strength,ROM,Balance,Coordination,FMC,IADL,Endurance,UE functional use,Decreased knowledge of use of [facility]   Psychosocial Skills: Environmental  Adaptations,Habits,Routines and Behaviors   Visit Diagnosis: Muscle weakness (generalized)  Other lack of coordination    Problem List Patient Active Problem List   Diagnosis Date Noted  . Acute appendicitis with appendiceal abscess 05/30/2018  . Hypocalcemia 02/15/2018  . Dysuria 02/15/2018  . Bilateral lower extremity edema 02/11/2018  . Vaginal yeast infection 01/30/2018  . Chest tightness 01/27/2018  . Healthcare-associated pneumonia 01/25/2018  . Pleural effusion, not elsewhere classified 01/25/2018  . Acute deep vein thrombosis (DVT) of left lower extremity ([place]) 01/24/2018  . Acute deep vein thrombosis (DVT) of right lower extremity ([place]) 01/24/2018  . Chronic allergic rhinitis 01/24/2018  . Depression with anxiety 01/24/2018  . UTI due to Klebsiella species 01/24/2018  . Protein-calorie malnutrition, severe ([employer]) 01/24/2018  . S/P insertion of IVC (inferior vena caval) filter 01/24/2018  . Reactive depression   . Hypokalemia   . Leukocytosis   . Essential hypertension   . Trauma   . Tetraparesis ([place])   . Neuropathic pain   . Neurogenic bowel   . Neurogenic bladder   . Benign essential HTN   . Acute blood loss anemia   . Central cord syndrome at C6 level of cervical spinal cord ([place]) 11/29/2017  . Central cord syndrome [facility]) 11/29/2017    Harrel Carina, MS, OTR/L 06/23/2020, 11:13 AM  [place] MAIN [facility] SERVICES [address] [place], [place], [address] Phone: [phone]   Fax:  [phone]  Name: MATTISEN POHLMANN MRN: [id number] Date of Birth: 20-Sep-1934

## 2020-06-23 NOTE — Therapy (Signed)
[place] MAIN [facility] SERVICES [address] [place], [place], [address] Phone: [phone]   Fax:  [phone]  Physical Therapy Treatment  Patient Details  Name: Sherry [name] MRN: [id number] Date of Birth: 05/29/1934 Referring Provider (PT): Viviana Simpler   Encounter Date: 06/23/2020   PT End of Session - 06/23/20 1152    Visit Number 42    Number of Visits 34    Date for PT Re-Evaluation 06/30/20    Authorization Type [employer] FOTO performed by PT on eval (7/20), score 12, Progress note on 03/10/2020    PT Start Time 1145    PT Stop Time 1225    PT Time Calculation (min) 40 min    Equipment Utilized During Treatment Gait belt    Activity Tolerance Patient tolerated treatment well;Patient limited by fatigue    Behavior During Therapy [facility] for tasks assessed/performed           Past Medical History:  Diagnosis Date  . Acute blood loss anemia   . Arthritis   . Cancer ([place])    skin  . Central cord syndrome at C6 level of cervical spinal cord ([place]) 11/29/2017  . Hypertension   . Protein-calorie malnutrition, severe ([place]) 01/24/2018  . S/P insertion of IVC (inferior vena caval) filter 01/24/2018  . Tetraparesis [facility])     Past Surgical History:  Procedure Laterality Date  . ANTERIOR CERVICAL DECOMP/DISCECTOMY FUSION N/A 11/29/2017   Procedure: Cervical five-six, six-seven Anterior Cervical Decompression Fusion;  Surgeon: Judith Part, MD;  Location: [place];  Service: Neurosurgery;  Laterality: N/A;  Cervical five-six, six-seven Anterior Cervical Decompression Fusion  . CATARACT EXTRACTION    . IR IVC FILTER PLMT / S&I /IMG GUID/MOD SED  12/08/2017    There were no vitals filed for this visit.   Subjective Assessment - 06/23/20 1151    Subjective Patient reports feeling okay today and states she received her COVID booster last Friday.    Currently in Pain? No/denies          Interventions:  Therapeutic  exercise:   Seated postural strengthening:  Forward lean (from extended back position) - focusing on abs and low back extensors- Instructed to lean forward/backward x 20 reps.   Scap retraction- YTB- 2 sets of 15 reps D2 shoulder flex- BUE - 2 sets of 15 reps Horizontal Shoulder abd using YTB - 2 sets of 15 reps Education provided throughout session via VC/TC and demonstration to facilitate movement at target joints and correct muscle activation for all testing and exercises performed.  Bilateral hip flex- AA on right LE and AROM on left- 2 sets of 12 reps Hip add squeeze on ball - hold 5 sec x 10 reps Hip abd (AROM on left and AAROM on right ) reclined position. 2 sets of 10 reps. Hip flex/abd stepping up/over pink hedgehog (Min assist for hip flex on right ) 2 sets of 10 reps Bilateral Leg press- Min activation on right vs. Left x 15 reps each LE *Patient denied pain but reports fatigue at end of session.   Clinical Impression: Patient able to participate well and performed LE and scap strengthening well today with VC and some physical assist today. She fatigues quickly yet able to recover and work throughout session focusing posture and LE Strengthening- requiring less assist with knee ext than in previous visit. Patient will benefit from continued PT services to increase mobility and strength to improve patient's quality of life.  PT Education - 06/23/20 1415    Education Details exercise techniques    Person(s) Educated Patient    Methods Explanation;Demonstration;Tactile cues;Verbal cues    Comprehension Verbalized understanding;Returned demonstration;Verbal cues required;Tactile cues required;Need further instruction            PT Short Term Goals - 03/10/20 2211      PT SHORT TERM GOAL #1   Title The patient will perform initial HEP with minimum assistance in order to improve strength and function.    Baseline Patient  demonstrating independence with initial HEP as of 03/10/2020 with no questions or difficulty.    Time 4    Period Weeks    Status Achieved    Target Date 02/11/20             PT Long Term Goals - 05/28/20 1408      PT LONG TERM GOAL #1   Title The patient will be compliant with finalized HEP with minimum assistance in preparation for self management and maintenance of condition.    Baseline 03/10/2020- Patient familiar with initial HEP and will keep goal active as HEP becomes more progressive including possible transfers and standing activities. 04/07/2020- Patient continues to report compliance with LE strengthening home exercises without questions or concerns. 04/21/2020- Patient able to verbalize and demonstrate good understanding of current HEP including some supine and seated LE strengthening exercises. Reviewed and patient performed 10 reps today with minimal cueing. Will keep goal active to progress HEP as appropriate. 05/28/2020- patient reports continues to perform LE strengthening as able and some help from caregiver as able. No changes at this time to home program as patient is limited to supine/seated therex.    Time 12    Period Weeks    Status Achieved      PT LONG TERM GOAL #2   Title The patient will demonstrate at least 10 point improvement on FOTO score indicating an improved ability to perform functional activities.    Baseline on eval (/720) score 12; 10/22/19: 12; 12/10/19: 18, 12/13: 18: 04/07/2020- Will assess next visit.    Time 12    Period Weeks    Status On-going    Target Date 06/30/20      PT LONG TERM GOAL #3   Title The patient will demonstrate lateral scooting  for assistance with transfers with minA to maximize independence.    Baseline 10/22/19: Pt requires modA+1 for lateral scooting; 12/10/19: modA+1, 12/13: modA-maxA, 12/27: modA-maxA, 03/10/2020- ModA-MaxA- increased verbal cues and visual demonstration. 04/07/2020- Continued Max assist to perform  forward/lateral scoot. 04/21/2020- Patient was able to demo slight lateral scoot with CGA approx 12 in then fatigued requiring max assist- she is able to scoot forward by leaning without significant issues. 05/28/2020- While sitting in chair- patient able to perform forward and lateral scoot with VC/visual demo and increased time allowed- CGA today but not consistent yet- will keep goal active at this time to continue to focus on strengthening and improving technique.    Time 12    Period Weeks    Status On-going    Target Date 06/30/20      PT LONG TERM GOAL #4   Title The patient will demonstrate a squat pivot transfer with minimum assistance to maximize independence and mobility.    Baseline 10/22/19: unable to safely attempt at this time; 12/10/19: unable to safely attempt at this time, 01/14/20: unable to safety attempt at this time. 03/10/2020- Patient able to perform Stand pivot transfer with  Maximal assist. 04/07/2020- Patient continues to require Max assist with sit to stand- will assist SPT next visit. 04/21/2020- Patient presents with Max assist to perform Sit to stand Transfer- unable to pivot or move Left LE well without difficulty.    Time 12    Period Weeks    Status On-going      PT LONG TERM GOAL #5   Title The patient will demonstrate sitting without UE support for 2-5 minutes at EOB to improve participation and maximize independence with ADLs.    Baseline 10/22/19: Pt able ot sit unsupported for at least 2 minutes at edge of bed. 04/21/2020- Patient continues to demo good sitting at edge of mat > 5 miin without difficulty or back support.    Time 12    Period Weeks    Status Achieved      Additional Long Term Goals   Additional Long Term Goals Yes      PT LONG TERM GOAL #6   Title Patient will demonstrate improved functional LE strength as seen by consistent ability to stand > 2 min (3 out of 4 trials) with Mod/Max A for improved LE strength with transfers.    Baseline 05/28/2020-  Patient able to inconsistent stand 1-2 min right now in //bars with Mod/Max A.    Time 4    Period Weeks    Status New    Target Date 06/30/20                 Plan - 06/23/20 1158    Clinical Impression Statement Patient able to participate well and performed LE and scap strengthening well today with VC and some physical assist today. She fatigues quickly yet able to recover and work throughout session focusing posture and LE Strengthening- requiring less assist with knee ext than in previous visit. Patient will benefit from continued PT services to increase mobility and strength to improve patient's quality of life.    Personal Factors and Comorbidities Age;Time since onset of injury/illness/exacerbation;Comorbidity 3+;Fitness    Comorbidities HTN, quadriparesis, history of DVT, neurogenic bladder    Examination-Activity Limitations Bathing;Hygiene/Grooming;Squat;Bed Mobility;Lift;Bend;Stand;Engineer, manufacturing;Toileting;Self Feeding;Transfers;Continence;Sit;Dressing;Sleep;Carry    Examination-Participation Restrictions Church;Laundry;Cleaning;Medication Management;Community Activity;Meal Prep;Interpersonal Relationship    Stability/Clinical Decision Making Evolving/Moderate complexity    Rehab Potential Fair    PT Frequency 2x / week    PT Duration 12 weeks    PT Treatment/Interventions ADLs/Self Care Home Management;Electrical Stimulation;Therapeutic activities;Wheelchair mobility training;Vasopneumatic Device;Joint Manipulations;Vestibular;Passive range of motion;Patient/family education;Therapeutic exercise;DME Instruction;Biofeedback;Aquatic Therapy;Moist Heat;Gait training;Balance training;Orthotic Fit/Training;Dry needling;Energy conservation;Taping;Splinting;Neuromuscular re-education;Cryotherapy;Ultrasound;Functional mobility training    PT Next Visit Plan Continue with progressive Standing/transfer training.    PT Home Exercise Plan no changes    Consulted and Agree  with Plan of Care Patient           Patient will benefit from skilled therapeutic intervention in order to improve the following deficits and impairments:  Abnormal gait,Decreased balance,Decreased endurance,Decreased mobility,Difficulty walking,Hypomobility,Impaired sensation,Decreased range of motion,Improper body mechanics,Impaired perceived functional ability,Decreased activity tolerance,Decreased knowledge of use of DME,Decreased safety awareness,Decreased strength,Impaired flexibility,Impaired UE functional use,Postural dysfunction  Visit Diagnosis: Abnormality of gait and mobility  Difficulty in walking, not elsewhere classified  Muscle weakness (generalized)  Other lack of coordination     Problem List Patient Active Problem List   Diagnosis Date Noted  . Acute appendicitis with appendiceal abscess 05/30/2018  . Hypocalcemia 02/15/2018  . Dysuria 02/15/2018  . Bilateral lower extremity edema 02/11/2018  . Vaginal yeast infection 01/30/2018  . Chest tightness 01/27/2018  .  Healthcare-associated pneumonia 01/25/2018  . Pleural effusion, not elsewhere classified 01/25/2018  . Acute deep vein thrombosis (DVT) of left lower extremity ([place]) 01/24/2018  . Acute deep vein thrombosis (DVT) of right lower extremity ([place]) 01/24/2018  . Chronic allergic rhinitis 01/24/2018  . Depression with anxiety 01/24/2018  . UTI due to Klebsiella species 01/24/2018  . Protein-calorie malnutrition, severe ([place]) 01/24/2018  . S/P insertion of IVC (inferior vena caval) filter 01/24/2018  . Reactive depression   . Hypokalemia   . Leukocytosis   . Essential hypertension   . Trauma   . Tetraparesis ([place])   . Neuropathic pain   . Neurogenic bowel   . Neurogenic bladder   . Benign essential HTN   . Acute blood loss anemia   . Central cord syndrome at C6 level of cervical spinal cord ([place]) 11/29/2017  . Central cord syndrome [facility] 11/29/2017    Lewis Moccasin, PT 06/23/2020, 2:26  PM  [place] MAIN [facility] SERVICES [address] [place], [place], [address] Phone: [phone]   Fax:  [phone]  Name: Sherry [name] MRN: [id number] Date of Birth: 11/13/1934

## 2020-06-25 ENCOUNTER — Ambulatory Visit: Payer: Medicare HMO | Admitting: Occupational Therapy

## 2020-06-25 ENCOUNTER — Encounter: Payer: Self-pay | Admitting: Occupational Therapy

## 2020-06-25 ENCOUNTER — Other Ambulatory Visit: Payer: Self-pay

## 2020-06-25 ENCOUNTER — Ambulatory Visit: Payer: Medicare HMO

## 2020-06-25 DIAGNOSIS — M6281 Muscle weakness (generalized): Secondary | ICD-10-CM

## 2020-06-25 DIAGNOSIS — R269 Unspecified abnormalities of gait and mobility: Secondary | ICD-10-CM

## 2020-06-25 DIAGNOSIS — R278 Other lack of coordination: Secondary | ICD-10-CM

## 2020-06-25 DIAGNOSIS — R262 Difficulty in walking, not elsewhere classified: Secondary | ICD-10-CM

## 2020-06-25 NOTE — Therapy (Signed)
[place] MAIN [facility] SERVICES [address] [place], [place], [address] Phone: [phone]   Fax:  [phone]  Occupational Therapy Treatment  Patient Details  Name: Sherry [name] MRN: [id number] Date of Birth: 1934/08/03 No data recorded  Encounter Date: 06/25/2020   OT End of Session - 06/25/20 1530    Visit Number 43    Number of Visits 88    Date for OT Re-Evaluation 07/30/20    Authorization Time Period Progress report period starting 05/07/2020    OT Start Time 1100    OT Stop Time 1145    OT Time Calculation (min) 45 min    Activity Tolerance Patient tolerated treatment well    Behavior During Therapy [facility] for tasks assessed/performed           Past Medical History:  Diagnosis Date  . Acute blood loss anemia   . Arthritis   . Cancer ([place])    skin  . Central cord syndrome at C6 level of cervical spinal cord ([place]) 11/29/2017  . Hypertension   . Protein-calorie malnutrition, severe ([place]) 01/24/2018  . S/P insertion of IVC (inferior vena caval) filter 01/24/2018  . Tetraparesis [facility])     Past Surgical History:  Procedure Laterality Date  . ANTERIOR CERVICAL DECOMP/DISCECTOMY FUSION N/A 11/29/2017   Procedure: Cervical five-six, six-seven Anterior Cervical Decompression Fusion;  Surgeon: Judith Part, MD;  Location: [place];  Service: Neurosurgery;  Laterality: N/A;  Cervical five-six, six-seven Anterior Cervical Decompression Fusion  . CATARACT EXTRACTION    . IR IVC FILTER PLMT / S&I /IMG GUID/MOD SED  12/08/2017    There were no vitals filed for this visit.   Subjective Assessment - 06/25/20 1105    Subjective  Pt. reports that she is doing well today.    Patient is accompanied by: Family member    Pertinent History Patient s/p fall November 29, 2017 resulting in diagnosis of central cord syndrome at C 6 level.  She has had therapy in multiple venues but with recent move to [facility], therapy staff recommended she  seek outpatient therapy for her needs.    Patient Stated Goals Patient would like to be able to move better in bed, stand and perform self care tasks.    Currently in Pain? No/denies          OT Treatment  Pt.tolerated bilateral shoulder flexion, extension, abduction, elbow flexion, extension forearm supination AAROM/AROM. PROM bilateral wrist extension, digit MP, PIP, and DIP flexion, and extension in conjunction with moist heat.Pt. worked on bilateral UE reaching with the right and Left UE using the horizontal [employer]. Pt.worked on reaching with incorporating her core to improve trunk control while reaching up, and sliding them to the right through3horizontal rungsto the right, and 3 rungs to the leftx's 1rep each, and through 2 horizontal rungs with each the right, and left UEs.  Pt. continues to make progress overall, and is improving with BUE ROM, and functional reaching.Pt. presents with no pain today, and was able to tolerate ROM, moist heat modality, and functional reaching without pain.Pt.was able to move the [place] rings through 3 horizontal rungs with both the right, and left UEstodayx's 1 rep eachwith her right, and left UE's, and through 2 horizontal rungs with each UE.Pt.continues toprogress with her RUE, andhand whenperforming self-feeding with adpativeequipment.Pt.continues to beable to use her right thumb to hold items. Pt.continues to benefit from working on improving ROM in order to work towards increasing engagement of  her bilateral hands during ADLs, and IADL tasks.                        OT Education - 06/25/20 1529    Education Details BUE ROM with functional reaching    Person(s) Educated Patient;Caregiver(s)    Methods Explanation;Demonstration    Comprehension Verbalized understanding;Need further instruction               OT Long Term Goals - 06/16/20 1305      OT LONG TERM GOAL #1   Title Patient and  caregiver will demonstrate understanding of home exercise program for ROM.    Baseline Pt. continues to have a restorative aide rehab aide assist her wih exercises at [facility]    Time 12    Period Weeks    Status On-going    Target Date 07/30/20      OT LONG TERM GOAL #4   Title Patient will demonstrate improved composite finger flexion to hold deodorant to apply to underarms.    Baseline Pt. continues to present with improving finger flexion of R hand for a partial gross grasp, but unable to grasp and hold items. Very limited digit flexion of L hand. Pt. has active left thumb abduction.    Time 12    Period Weeks    Status On-going    Target Date 07/30/20      OT LONG TERM GOAL #6   Title Patient will increase right UE ROM to comb the back of her hair with modified independence.    Baseline Pt. continues to be able to comb the front, and sides of her head. Pt. can reach the back, however cannot see what she is doing in the back of her head.    Time 12    Period Weeks    Status Partially Met    Target Date 07/30/20      OT LONG TERM GOAL #8   Title Pt. will write, and sign her name with 100% legibility, and modified independence.    Baseline Pt. continues to  consistently filling out her daily menu, and writing for crossword puzzles. Pt. is able to maintain grasp on a wide width pen. [place] to work on Sherry planner.    Time 12    Period Weeks    Status On-going    Target Date 07/30/20                 Plan - 06/25/20 1530    Clinical Impression Statement Pt. continues to make progress overall, and is improving with BUE ROM, and functional reaching.Pt. presents with no pain today, and was able to tolerate ROM, moist heat modality, and functional reaching without pain.Pt.was able to move the [place] rings through 3 horizontal rungs with both the right, and left UEstodayx's 1 rep eachwith her right, and left UE's, and through 2 horizontal rungs with each  UE.Pt.continues toprogress with her RUE, andhand whenperforming self-feeding with adpativeequipment.Pt.continues to beable to use her right thumb to hold items. Pt.continues to benefit from working on improving ROM in order to work towards increasing engagement of her bilateral hands during ADLs, and IADL tasks.   OT Occupational Profile and History Detailed Assessment- Review of Records and additional review of physical, cognitive, psychosocial history related to current functional performance    Occupational performance deficits (Please refer to evaluation for details): ADL's;IADL's;Leisure;Social Participation    Body Structure / Function / Physical Skills ADL;Continence;Dexterity;Flexibility;Strength;ROM;Balance;Coordination;FMC;IADL;Endurance;UE functional use;Decreased  knowledge of use of [facility]    Psychosocial Skills Environmental  Adaptations;Habits;Routines and Behaviors    Rehab Potential Fair    Clinical Decision Making Limited treatment options, no task modification necessary    Comorbidities Affecting Occupational Performance: Presence of comorbidities impacting occupational performance    Comorbidities impacting occupational performance description: contractures of bilateral hands, dependent transfers,    Modification or Assistance to Complete Evaluation  No modification of tasks or assist necessary to complete eval    OT Frequency 2x / week    OT Duration 12 weeks    OT Treatment/Interventions Self-care/ADL training;Cryotherapy;Therapeutic exercise;DME and/or AE instruction;Balance training;Neuromuscular education;Manual Therapy;Splinting;Moist Heat;Passive range of motion;Therapeutic activities;Patient/family education    Consulted and Agree with Plan of Care Patient           Patient will benefit from skilled therapeutic intervention in order to improve the following deficits and impairments:   Body Structure / Function / Physical Skills:  ADL,Continence,Dexterity,Flexibility,Strength,ROM,Balance,Coordination,FMC,IADL,Endurance,UE functional use,Decreased knowledge of use of [facility]   Psychosocial Skills: Environmental  Adaptations,Habits,Routines and Behaviors   Visit Diagnosis: Muscle weakness (generalized)    Problem List Patient Active Problem List   Diagnosis Date Noted  . Acute appendicitis with appendiceal abscess 05/30/2018  . Hypocalcemia 02/15/2018  . Dysuria 02/15/2018  . Bilateral lower extremity edema 02/11/2018  . Vaginal yeast infection 01/30/2018  . Chest tightness 01/27/2018  . Healthcare-associated pneumonia 01/25/2018  . Pleural effusion, not elsewhere classified 01/25/2018  . Acute deep vein thrombosis (DVT) of left lower extremity ([place]) 01/24/2018  . Acute deep vein thrombosis (DVT) of right lower extremity ([place]) 01/24/2018  . Chronic allergic rhinitis 01/24/2018  . Depression with anxiety 01/24/2018  . UTI due to Klebsiella species 01/24/2018  . Protein-calorie malnutrition, severe ([place]) 01/24/2018  . S/P insertion of IVC (inferior vena caval) filter 01/24/2018  . Reactive depression   . Hypokalemia   . Leukocytosis   . Essential hypertension   . Trauma   . Tetraparesis ([place])   . Neuropathic pain   . Neurogenic bowel   . Neurogenic bladder   . Benign essential HTN   . Acute blood loss anemia   . Central cord syndrome at C6 level of cervical spinal cord ([place]) 11/29/2017  . Central cord syndrome [facility]) 11/29/2017    Harrel Carina, MS, OTR/L 06/25/2020, 3:32 PM  [place] MAIN [facility] SERVICES [address] [place], [place], [address] Phone: [phone]   Fax:  [phone]  Name: Sherry [name] MRN: [id number] Date of Birth: 19-Feb-1934

## 2020-06-25 NOTE — Therapy (Signed)
[place] MAIN [facility] SERVICES [address] [place], [place], [address] Phone: [phone]   Fax:  [phone]  Physical Therapy Treatment  Patient Details  Name: Sherry [name] MRN: [id number] Date of Birth: [birth year] Referring Provider (PT): Viviana Simpler   Encounter Date: 06/25/2020   PT End of Session - 06/25/20 1256    Visit Number 2    Number of Visits 73    Date for PT Re-Evaluation 06/30/20    Authorization Type [employer]are FOTO performed by PT on eval (7/20), score 12, Progress note on 03/10/2020    PT Start Time 1145    PT Stop Time 1218    PT Time Calculation (min) 33 min    Equipment Utilized During Treatment Gait belt    Activity Tolerance Patient tolerated treatment well;Patient limited by fatigue    Behavior During Therapy [facility] for tasks assessed/performed           Past Medical History:  Diagnosis Date  . Acute blood loss anemia   . Arthritis   . Cancer ([place])    skin  . Central cord syndrome at C6 level of cervical spinal cord ([place]) 11/29/2017  . Hypertension   . Protein-calorie malnutrition, severe ([place]) 01/24/2018  . S/P insertion of IVC (inferior vena caval) filter 01/24/2018  . Tetraparesis [facility])     Past Surgical History:  Procedure Laterality Date  . ANTERIOR CERVICAL DECOMP/DISCECTOMY FUSION N/A 11/29/2017   Procedure: Cervical five-six, six-seven Anterior Cervical Decompression Fusion;  Surgeon: Judith Part, MD;  Location: [place];  Service: Neurosurgery;  Laterality: N/A;  Cervical five-six, six-seven Anterior Cervical Decompression Fusion  . CATARACT EXTRACTION    . IR IVC FILTER PLMT / S&I /IMG GUID/MOD SED  12/08/2017    There were no vitals filed for this visit.   Subjective Assessment - 06/25/20 1254    Subjective Patient reports feeling better today vs. Monday and agreeable to try to stand using platform walker today.    Currently in Pain? No/denies             Interventions:  Patient performed stand pivot transfer from lift chair  With Mod A today. VC for scooting out to edge and patient able to stand today and even move left LE slightly for improved pivot transfer with less physical assistance today.   Static standing using bilateral platform walker today: Patient required Mod A +2 to stand and position arms onto platform yet less overall assist once up and patient required persistent VC/TC to tighten glutes and stand erect. She performed well and able to stand for 3 trials between 30 sec and 1 min 10 sec today. Patient reported fearful initially yet pleasantly pleased with her ability requiring less physical assist overall.   Clinical Impression: Patient able to demonstrate good progress with transfer technique including moving her left foot to pivot for the 1st time today. She then proceeded to stand today using bilateral platform attachment without less overall physical assist -although standing times were decreased - patient able to improve with overall quality. Patient will benefit from continued PT services to increase mobility and strength to improve patient's quality of life.                         PT Education - 06/25/20 1256    Education Details DME usage (platform walker); transfer technique    Person(s) Educated Patient    Methods Explanation;Demonstration;Tactile cues;Verbal cues  Comprehension Verbalized understanding;Returned demonstration;Verbal cues required;Tactile cues required;Need further instruction            PT Short Term Goals - 03/10/20 2211      PT SHORT TERM GOAL #1   Title The patient will perform initial HEP with minimum assistance in order to improve strength and function.    Baseline Patient demonstrating independence with initial HEP as of 03/10/2020 with no questions or difficulty.    Time 4    Period Weeks    Status Achieved    Target Date 02/11/20             PT Long Term  Goals - 05/28/20 1408      PT LONG TERM GOAL #1   Title The patient will be compliant with finalized HEP with minimum assistance in preparation for self management and maintenance of condition.    Baseline 03/10/2020- Patient familiar with initial HEP and will keep goal active as HEP becomes more progressive including possible transfers and standing activities. 04/07/2020- Patient continues to report compliance with LE strengthening home exercises without questions or concerns. 04/21/2020- Patient able to verbalize and demonstrate good understanding of current HEP including some supine and seated LE strengthening exercises. Reviewed and patient performed 10 reps today with minimal cueing. Will keep goal active to progress HEP as appropriate. 05/28/2020- patient reports continues to perform LE strengthening as able and some help from caregiver as able. No changes at this time to home program as patient is limited to supine/seated therex.    Time 12    Period Weeks    Status Achieved      PT LONG TERM GOAL #2   Title The patient will demonstrate at least 10 point improvement on FOTO score indicating an improved ability to perform functional activities.    Baseline on eval (/720) score 12; 10/22/19: 12; 12/10/19: 18, 12/13: 18: 04/07/2020- Will assess next visit.    Time 12    Period Weeks    Status On-going    Target Date 06/30/20      PT LONG TERM GOAL #3   Title The patient will demonstrate lateral scooting  for assistance with transfers with minA to maximize independence.    Baseline 10/22/19: Pt requires modA+1 for lateral scooting; 12/10/19: modA+1, 12/13: modA-maxA, 12/27: modA-maxA, 03/10/2020- ModA-MaxA- increased verbal cues and visual demonstration. 04/07/2020- Continued Max assist to perform forward/lateral scoot. 04/21/2020- Patient was able to demo slight lateral scoot with CGA approx 12 in then fatigued requiring max assist- she is able to scoot forward by leaning without significant issues.  05/28/2020- While sitting in chair- patient able to perform forward and lateral scoot with VC/visual demo and increased time allowed- CGA today but not consistent yet- will keep goal active at this time to continue to focus on strengthening and improving technique.    Time 12    Period Weeks    Status On-going    Target Date 06/30/20      PT LONG TERM GOAL #4   Title The patient will demonstrate a squat pivot transfer with minimum assistance to maximize independence and mobility.    Baseline 10/22/19: unable to safely attempt at this time; 12/10/19: unable to safely attempt at this time, 01/14/20: unable to safety attempt at this time. 03/10/2020- Patient able to perform Stand pivot transfer with Maximal assist. 04/07/2020- Patient continues to require Max assist with sit to stand- will assist SPT next visit. 04/21/2020- Patient presents with Max assist to perform Sit to  stand Transfer- unable to pivot or move Left LE well without difficulty.    Time 12    Period Weeks    Status On-going      PT LONG TERM GOAL #5   Title The patient will demonstrate sitting without UE support for 2-5 minutes at EOB to improve participation and maximize independence with ADLs.    Baseline 10/22/19: Pt able ot sit unsupported for at least 2 minutes at edge of bed. 04/21/2020- Patient continues to demo good sitting at edge of mat > 5 miin without difficulty or back support.    Time 12    Period Weeks    Status Achieved      Additional Long Term Goals   Additional Long Term Goals Yes      PT LONG TERM GOAL #6   Title Patient will demonstrate improved functional LE strength as seen by consistent ability to stand > 2 min (3 out of 4 trials) with Mod/Max A for improved LE strength with transfers.    Baseline 05/28/2020- Patient able to inconsistent stand 1-2 min right now in //bars with Mod/Max A.    Time 4    Period Weeks    Status New    Target Date 06/30/20                 Plan - 06/25/20 1257    Clinical  Impression Statement Patient able to demonstrate good progress with transfer technique including moving her left foot to pivot for the 1st time today. She then proceeded to stand today using bilateral platform attachment without less overall physical assist -although standing times were decreased - patient able to improve with overall quality. Patient will benefit from continued PT services to increase mobility and strength to improve patient's quality of life.    Personal Factors and Comorbidities Age;Time since onset of injury/illness/exacerbation;Comorbidity 3+;Fitness    Comorbidities HTN, quadriparesis, history of DVT, neurogenic bladder    Examination-Activity Limitations Bathing;Hygiene/Grooming;Squat;Bed Mobility;Lift;Bend;Stand;Engineer, manufacturing;Toileting;Self Feeding;Transfers;Continence;Sit;Dressing;Sleep;Carry    Examination-Participation Restrictions Church;Laundry;Cleaning;Medication Management;Community Activity;Meal Prep;Interpersonal Relationship    Stability/Clinical Decision Making Evolving/Moderate complexity    Rehab Potential Fair    PT Frequency 2x / week    PT Duration 12 weeks    PT Treatment/Interventions ADLs/Self Care Home Management;Electrical Stimulation;Therapeutic activities;Wheelchair mobility training;Vasopneumatic Device;Joint Manipulations;Vestibular;Passive range of motion;Patient/family education;Therapeutic exercise;DME Instruction;Biofeedback;Aquatic Therapy;Moist Heat;Gait training;Balance training;Orthotic Fit/Training;Dry needling;Energy conservation;Taping;Splinting;Neuromuscular re-education;Cryotherapy;Ultrasound;Functional mobility training    PT Next Visit Plan Continue with progressive Standing/transfer training.    PT Home Exercise Plan no changes    Consulted and Agree with Plan of Care Patient           Patient will benefit from skilled therapeutic intervention in order to improve the following deficits and impairments:  Abnormal  gait,Decreased balance,Decreased endurance,Decreased mobility,Difficulty walking,Hypomobility,Impaired sensation,Decreased range of motion,Improper body mechanics,Impaired perceived functional ability,Decreased activity tolerance,Decreased knowledge of use of DME,Decreased safety awareness,Decreased strength,Impaired flexibility,Impaired UE functional use,Postural dysfunction  Visit Diagnosis: Abnormality of gait and mobility  Difficulty in walking, not elsewhere classified  Muscle weakness (generalized)  Other lack of coordination     Problem List Patient Active Problem List   Diagnosis Date Noted  . Acute appendicitis with appendiceal abscess 05/30/2018  . Hypocalcemia 02/15/2018  . Dysuria 02/15/2018  . Bilateral lower extremity edema 02/11/2018  . Vaginal yeast infection 01/30/2018  . Chest tightness 01/27/2018  . Healthcare-associated pneumonia 01/25/2018  . Pleural effusion, not elsewhere classified 01/25/2018  . Acute deep vein thrombosis (DVT) of left lower extremity ([place])  01/24/2018  . Acute deep vein thrombosis (DVT) of right lower extremity ([place]) 01/24/2018  . Chronic allergic rhinitis 01/24/2018  . Depression with anxiety 01/24/2018  . UTI due to Klebsiella species 01/24/2018  . Protein-calorie malnutrition, severe ([place]) 01/24/2018  . S/P insertion of IVC (inferior vena caval) filter 01/24/2018  . Reactive depression   . Hypokalemia   . Leukocytosis   . Essential hypertension   . Trauma   . Tetraparesis ([place])   . Neuropathic pain   . Neurogenic bowel   . Neurogenic bladder   . Benign essential HTN   . Acute blood loss anemia   . Central cord syndrome at C6 level of cervical spinal cord ([place]) 11/29/2017  . Central cord syndrome [facility] 11/29/2017    Lewis Moccasin, PT 06/26/2020, 4:17 PM  [place] MAIN [facility] SERVICES [address] [place], [place], [address] Phone: [phone]   Fax:  [phone]  Name: Sherry [name] MRN: [id number] Date of Birth: [birth year]

## 2020-07-02 ENCOUNTER — Ambulatory Visit: Payer: Medicare HMO | Attending: Internal Medicine | Admitting: Occupational Therapy

## 2020-07-02 ENCOUNTER — Ambulatory Visit: Payer: Medicare HMO

## 2020-07-02 ENCOUNTER — Other Ambulatory Visit: Payer: Self-pay

## 2020-07-02 ENCOUNTER — Encounter: Payer: Self-pay | Admitting: Occupational Therapy

## 2020-07-02 DIAGNOSIS — R269 Unspecified abnormalities of gait and mobility: Secondary | ICD-10-CM

## 2020-07-02 DIAGNOSIS — M6281 Muscle weakness (generalized): Secondary | ICD-10-CM

## 2020-07-02 DIAGNOSIS — R278 Other lack of coordination: Secondary | ICD-10-CM | POA: Diagnosis present

## 2020-07-02 DIAGNOSIS — R262 Difficulty in walking, not elsewhere classified: Secondary | ICD-10-CM | POA: Diagnosis present

## 2020-07-02 NOTE — Therapy (Signed)
[place] MAIN [facility] SERVICES [address] [place], [place], [address] Phone: [phone]   Fax:  [phone]  Physical Therapy Treatment/ Recertification for dates 07/02/2020- 09/24/2020  Patient Details  Name: Sherry [name] MRN: [id number] Date of Birth: 05/29/1934 Referring Provider (PT): Viviana Simpler   Encounter Date: 07/02/2020   PT End of Session - 07/02/20 1732    Visit Number 33    Number of Visits 104    Date for PT Re-Evaluation 09/24/20    Authorization Type a[employer]na medicare FOTO performed by PT on eval (7/20), score 12, Progress note on 03/10/2020    Authorization Time Period Recert 02/07/5100- 5/85/2778    PT Start Time 1145    PT Stop Time 1229    PT Time Calculation (min) 44 min    Equipment Utilized During Treatment Gait belt    Activity Tolerance Patient tolerated treatment well;Patient limited by fatigue    Behavior During Therapy [facility] for tasks assessed/performed           Past Medical History:  Diagnosis Date  . Acute blood loss anemia   . Arthritis   . Cancer ([place])    skin  . Central cord syndrome at C6 level of cervical spinal cord ([place]) 11/29/2017  . Hypertension   . Protein-calorie malnutrition, severe ([place]) 01/24/2018  . S/P insertion of IVC (inferior vena caval) filter 01/24/2018  . Tetraparesis [facility])     Past Surgical History:  Procedure Laterality Date  . ANTERIOR CERVICAL DECOMP/DISCECTOMY FUSION N/A 11/29/2017   Procedure: Cervical five-six, six-seven Anterior Cervical Decompression Fusion;  Surgeon: Judith Part, MD;  Location: [place];  Service: Neurosurgery;  Laterality: N/A;  Cervical five-six, six-seven Anterior Cervical Decompression Fusion  . CATARACT EXTRACTION    . IR IVC FILTER PLMT / S&I /IMG GUID/MOD SED  12/08/2017    There were no vitals filed for this visit.   Subjective Assessment - 07/02/20 1729    Subjective Patient reports feeling good and eager to try standing/transfers with  platform walker.    Pertinent History Pt is an 86 yo female that fell in 2019, fractured vertebrae in her neck and in her low back per family. 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 After hospital stay, pt discharged to [facility] for ~1 month, experienced severe UTI as well as bilateral DVT (IVC filter placed). Discharged to [facility], stayed for about a year, and then moved to [facility] in April 2021. Was receiving PT, but per family facility reported that did not have adequate equipment to maximize PT for her. Pt until about 1 month ago was practicing sit to stand transfers with 1-2 people, and working on static standing. Has a brace for L foot due to PF. Pt currently needs assistance with all ADLs (able to assist with donning/doffing her shirt), bed baths, and needs a hoyer lift for transfers. Able to drive power wheelchair without assistance.    Limitations Standing;Walking;House hold activities;Lifting    How long can you sit comfortably? NA    How long can you stand comfortably? unable    How long can you walk comfortably? unable    Patient Stated Goals "to stand up and walk"    Currently in Pain? No/denies           Reassessed LTG - See goal section for details.   Transfer training- Patient performed stand pivot transfer from lift chair to mat table - max assist to pivot- yet patient was able  to move her left LE slightly today.   Static stand using Bilateral Platfom attachment- x 4 trials ranging from 36 sec to 1 min 44 sec today- Focusing on extending trunk/hips and weight shifting forward to allow improved center of gravity. Patient initially with difficulty with posture and weight shifting as well as difficulty with left foot sliding forward but improved with each trial.   Clinical Impression: Patient continues to progress overall throughout this certification. She has progressed well with standing endurance in // bars to consistently over 2 min and now progressed to  trying standing with platform walker. She also is now able perform stand pivot rather than squat pivot transfer with max assist and will continue to benefit from skilled PT in this area to continue gains to decrease need for hoyer lift transfers. Patient's condition has the potential to improve in response to therapy. Maximum improvement is yet to be obtained. The anticipated improvement is attainable and reasonable in a generally predictable time.                           PT Education - 07/02/20 1230    Education Details Postural cues    Person(s) Educated Patient    Methods Explanation;Demonstration;Tactile cues;Verbal cues    Comprehension Verbalized understanding;Returned demonstration;Verbal cues required;Need further instruction            PT Short Term Goals - 03/10/20 2211      PT SHORT TERM GOAL #1   Title The patient will perform initial HEP with minimum assistance in order to improve strength and function.    Baseline Patient demonstrating independence with initial HEP as of 03/10/2020 with no questions or difficulty.    Time 4    Period Weeks    Status Achieved    Target Date 02/11/20             PT Long Term Goals - 07/02/20 1734      PT LONG TERM GOAL #1   Title The patient will be compliant with finalized HEP with minimum assistance in preparation for self management and maintenance of condition.    Baseline 03/10/2020- Patient familiar with initial HEP and will keep goal active as HEP becomes more progressive including possible transfers and standing activities. 04/07/2020- Patient continues to report compliance with LE strengthening home exercises without questions or concerns. 04/21/2020- Patient able to verbalize and demonstrate good understanding of current HEP including some supine and seated LE strengthening exercises. Reviewed and patient performed 10 reps today with minimal cueing. Will keep goal active to progress HEP as appropriate.  05/28/2020- patient reports continues to perform LE strengthening as able and some help from caregiver as able. No changes at this time to home program as patient is limited to supine/seated therex. 07/02/2020- Patient continues to report compliance with current supine and seated LE strengtheing home program and states no questions or concerns regarding her plan.    Time 12    Period Weeks    Status Achieved      PT LONG TERM GOAL #2   Title The patient will demonstrate at least 10 point improvement on FOTO score indicating an improved ability to perform functional activities.    Baseline on eval (/720) score 12; 10/22/19: 12; 12/10/19: 18, 12/13: 18: 04/07/2020- Will assess next visit. 07/02/2020- Will obtain next visit.    Time 12    Period Weeks    Status On-going    Target Date 09/24/20  PT LONG TERM GOAL #3   Title The patient will demonstrate lateral scooting  for assistance with transfers with minA to maximize independence.    Baseline 10/22/19: Pt requires modA+1 for lateral scooting; 12/10/19: modA+1, 12/13: modA-maxA, 12/27: modA-maxA, 03/10/2020- ModA-MaxA- increased verbal cues and visual demonstration. 04/07/2020- Continued Max assist to perform forward/lateral scoot. 04/21/2020- Patient was able to demo slight lateral scoot with CGA approx 12 in then fatigued requiring max assist- she is able to scoot forward by leaning without significant issues. 05/28/2020- While sitting in chair- patient able to perform forward and lateral scoot with VC/visual demo and increased time allowed- CGA today but not consistent yet- will keep goal active at this time to continue to focus on strengthening and improving technique. 07/02/2020- Patient able to demonstrate minimal ability to laterally shift weigh on mat table requiring max assist yet is able to forward scoot out to edge of mat table with min Assist.    Time 12    Period Weeks    Status On-going    Target Date 09/24/20      PT LONG TERM GOAL #4   Title  The patient will demonstrate a squat pivot transfer with minimum assistance to maximize independence and mobility. 07/02/2020- Patient is able to perform a stand pivot transfer with max assist- was able to minimally pivot on left LE today with transfer.    Baseline 10/22/19: unable to safely attempt at this time; 12/10/19: unable to safely attempt at this time, 01/14/20: unable to safety attempt at this time. 03/10/2020- Patient able to perform Stand pivot transfer with Maximal assist. 04/07/2020- Patient continues to require Max assist with sit to stand- will assist SPT next visit. 04/21/2020- Patient presents with Max assist to perform Sit to stand Transfer- unable to pivot or move Left LE well without difficulty.    Time 12    Period Weeks    Status On-going    Target Date 09/24/20      PT LONG TERM GOAL #5   Title The patient will demonstrate sitting without UE support for 2-5 minutes at EOB to improve participation and maximize independence with ADLs.    Baseline 10/22/19: Pt able to sit unsupported for at least 2 minutes at edge of bed. 04/21/2020- Patient continues to demo good sitting at edge of mat > 5 miin without difficulty or back support.    Time 12    Period Weeks    Status Achieved      PT LONG TERM GOAL #6   Title Patient will demonstrate improved functional LE strength as seen by consistent ability to stand > 2 min (3 out of 4 trials) with Mod/Max A for improved LE strength with transfers.    Baseline 05/28/2020- Patient able to inconsistent stand 1-2 min right now in //bars with Mod/Max A. 07/02/2020- Patient was able to progress last 2 visit to standing using bilateral platform attachment between 1-2 min which is a progression from the parallel bars.    Time 12    Period Weeks    Status On-going    Target Date 09/24/20                 Plan - 07/02/20 1733    Clinical Impression Statement Patient continues to progress overall throughout this certification. She has progressed  well with standing endurance in // bars to consistently over 2 min and now progressed to trying standing with platform walker. She also is now able perform stand pivot rather than  squat pivot transfer with max assist and will continue to benefit from skilled PT in this area to continue gains to decrease need for hoyer lift transfers. Patient's condition has the potential to improve in response to therapy. Maximum improvement is yet to be obtained. The anticipated improvement is attainable and reasonable in a generally predictable time.    Personal Factors and Comorbidities Age;Time since onset of injury/illness/exacerbation;Comorbidity 3+;Fitness    Comorbidities HTN, quadriparesis, history of DVT, neurogenic bladder    Examination-Activity Limitations Bathing;Hygiene/Grooming;Squat;Bed Mobility;Lift;Bend;Stand;Engineer, manufacturing;Toileting;Self Feeding;Transfers;Continence;Sit;Dressing;Sleep;Carry    Examination-Participation Restrictions Church;Laundry;Cleaning;Medication Management;Community Activity;Meal Prep;Interpersonal Relationship    Stability/Clinical Decision Making Evolving/Moderate complexity    Rehab Potential Fair    PT Frequency 2x / week    PT Duration 12 weeks    PT Treatment/Interventions ADLs/Self Care Home Management;Electrical Stimulation;Therapeutic activities;Wheelchair mobility training;Vasopneumatic Device;Joint Manipulations;Vestibular;Passive range of motion;Patient/family education;Therapeutic exercise;DME Instruction;Biofeedback;Aquatic Therapy;Moist Heat;Gait training;Balance training;Orthotic Fit/Training;Dry needling;Energy conservation;Taping;Splinting;Neuromuscular re-education;Cryotherapy;Ultrasound;Functional mobility training    PT Next Visit Plan Continue with progressive Standing/transfer training.    PT Home Exercise Plan no changes    Consulted and Agree with Plan of Care Patient           Patient will benefit from skilled therapeutic  intervention in order to improve the following deficits and impairments:  Abnormal gait,Decreased balance,Decreased endurance,Decreased mobility,Difficulty walking,Hypomobility,Impaired sensation,Decreased range of motion,Improper body mechanics,Impaired perceived functional ability,Decreased activity tolerance,Decreased knowledge of use of DME,Decreased safety awareness,Decreased strength,Impaired flexibility,Impaired UE functional use,Postural dysfunction  Visit Diagnosis: Abnormality of gait and mobility  Difficulty in walking, not elsewhere classified  Muscle weakness (generalized)     Problem List Patient Active Problem List   Diagnosis Date Noted  . Acute appendicitis with appendiceal abscess 05/30/2018  . Hypocalcemia 02/15/2018  . Dysuria 02/15/2018  . Bilateral lower extremity edema 02/11/2018  . Vaginal yeast infection 01/30/2018  . Chest tightness 01/27/2018  . Healthcare-associated pneumonia 01/25/2018  . Pleural effusion, not elsewhere classified 01/25/2018  . Acute deep vein thrombosis (DVT) of left lower extremity ([place]) 01/24/2018  . Acute deep vein thrombosis (DVT) of right lower extremity ([place]) 01/24/2018  . Chronic allergic rhinitis 01/24/2018  . Depression with anxiety 01/24/2018  . UTI due to Klebsiella species 01/24/2018  . Protein-calorie malnutrition, severe ([place]) 01/24/2018  . S/P insertion of IVC (inferior vena caval) filter 01/24/2018  . Reactive depression   . Hypokalemia   . Leukocytosis   . Essential hypertension   . Trauma   . Tetraparesis ([place])   . Neuropathic pain   . Neurogenic bowel   . Neurogenic bladder   . Benign essential HTN   . Acute blood loss anemia   . Central cord syndrome at C6 level of cervical spinal cord ([place]) 11/29/2017  . Central cord syndrome [facility]) 11/29/2017    Lewis Moccasin, PT 07/03/2020, 5:57 PM  [place] MAIN [facility] SERVICES [address] [place], [place],  [address] Phone: [phone]   Fax:  [phone]  Name: SHARION GRIEVES MRN: [id number] Date of Birth: 1935-01-13

## 2020-07-02 NOTE — Therapy (Signed)
[place] MAIN [facility] SERVICES [address] [place], [place], [address] Phone: [phone]   Fax:  [phone]  Occupational Therapy Treatment  Patient Details  Name: Sherry [name] MRN: [id number] Date of Birth: [birth year] No data recorded  Encounter Date: 07/02/2020   OT End of Session - 07/02/20 1306    Visit Number 93    Number of Visits 24    Date for OT Re-Evaluation 07/30/20    Authorization Time Period Progress report period starting 05/07/2020    OT Start Time 1100    OT Stop Time 1145    OT Time Calculation (min) 45 min    Activity Tolerance Patient tolerated treatment well    Behavior During Therapy [facility] for tasks assessed/performed           Past Medical History:  Diagnosis Date  . Acute blood loss anemia   . Arthritis   . Cancer ([place])    skin  . Central cord syndrome at C6 level of cervical spinal cord ([place]) 11/29/2017  . Hypertension   . Protein-calorie malnutrition, severe ([place]) 01/24/2018  . S/P insertion of IVC (inferior vena caval) filter 01/24/2018  . Tetraparesis [facility])     Past Surgical History:  Procedure Laterality Date  . ANTERIOR CERVICAL DECOMP/DISCECTOMY FUSION N/A 11/29/2017   Procedure: Cervical five-six, six-seven Anterior Cervical Decompression Fusion;  Surgeon: Judith Part, MD;  Location: [place];  Service: Neurosurgery;  Laterality: N/A;  Cervical five-six, six-seven Anterior Cervical Decompression Fusion  . CATARACT EXTRACTION    . IR IVC FILTER PLMT / S&I /IMG GUID/MOD SED  12/08/2017    There were no vitals filed for this visit.   Subjective Assessment - 07/02/20 1255    Subjective  Pt. reports that she is doing well today.    Patient is accompanied by: Family member    Pertinent History Patient s/p fall November 29, 2017 resulting in diagnosis of central cord syndrome at C 6 level.  She has had therapy in multiple venues but with recent move to [facility], therapy staff recommended she  seek outpatient therapy for her needs.    Currently in Pain? No/denies          OT Treatment  Pt.tolerated bilateral shoulder flexion, extension, abduction, elbow flexion, extension forearm supination AAROM/AROM. PROM bilateral wrist extension, digit MP, PIP, and DIP flexion, and extension in conjunction with moist heat.Pt. worked on bilateral UE reaching with the right and Left UE using the horizontal [employer]. Pt.worked on reaching with incorporating her core to improve trunk control while reaching up, and sliding them to the right through3horizontal rungsto the right, and 3 rungs to the leftx's 1rep each, and through 2 horizontal rungs with each the right, and left UEs.  Pt. continues to make progress overall, and is improving with BUE ROM, and functional reaching.Pt. presents with no pain today, and was able to tolerate ROM, moist heat modality, and functional reaching without pain.Pt.was able to move the [place] rings through 3 horizontal rungs with both the right, and left UEstodayx's 2 reps eachwith her right, and left UE's, and through 2 horizontal rungs with each UE.Pt.continues toprogress with her RUE, andhand whenperforming self-feeding with adpativeequipment.Pt.continues to beable to use her right thumb to hold items. Pt.continues to benefit from working on improving ROM in order to work towards increasing engagement of her bilateral hands during ADLs, and IADL tasks.  OT Education - 07/02/20 1306    Education Details BUE ROM with functional reaching    Methods Explanation;Demonstration    Comprehension Verbalized understanding;Need further instruction               OT Long Term Goals - 06/16/20 1305      OT LONG TERM GOAL #1   Title Patient and caregiver will demonstrate understanding of home exercise program for ROM.    Baseline Pt. continues to have a restorative aide rehab aide assist her wih exercises  at [facility]    Time 12    Period Weeks    Status On-going    Target Date 07/30/20      OT LONG TERM GOAL #4   Title Patient will demonstrate improved composite finger flexion to hold deodorant to apply to underarms.    Baseline Pt. continues to present with improving finger flexion of R hand for a partial gross grasp, but unable to grasp and hold items. Very limited digit flexion of L hand. Pt. has active left thumb abduction.    Time 12    Period Weeks    Status On-going    Target Date 07/30/20      OT LONG TERM GOAL #6   Title Patient will increase right UE ROM to comb the back of her hair with modified independence.    Baseline Pt. continues to be able to comb the front, and sides of her head. Pt. can reach the back, however cannot see what she is doing in the back of her head.    Time 12    Period Weeks    Status Partially Met    Target Date 07/30/20      OT LONG TERM GOAL #8   Title Pt. will write, and sign her name with 100% legibility, and modified independence.    Baseline Pt. continues to  consistently filling out her daily menu, and writing for crossword puzzles. Pt. is able to maintain grasp on a wide width pen. [place] to work on Media planner.    Time 12    Period Weeks    Status On-going    Target Date 07/30/20                 Plan - 07/02/20 1307    Clinical Impression Statement Pt. continues to make progress overall, and is improving with BUE ROM, and functional reaching.Pt. presents with no pain today, and was able to tolerate ROM, moist heat modality, and functional reaching without pain.Pt.was able to move the Saebo rings through 3 horizontal rungs with both the right, and left UEstodayx's 2 reps eachwith her right, and left UE's, and through 2 horizontal rungs with each UE.Pt.continues toprogress with her RUE, andhand whenperforming self-feeding with adpativeequipment.Pt.continues to beable to use her right thumb to hold items.  Pt.continues to benefit from working on improving ROM in order to work towards increasing engagement of her bilateral hands during ADLs, and IADL tasks.   OT Occupational Profile and History Detailed Assessment- Review of Records and additional review of physical, cognitive, psychosocial history related to current functional performance    Occupational performance deficits (Please refer to evaluation for details): ADL's;IADL's;Leisure;Social Participation    Body Structure / Function / Physical Skills ADL;Continence;Dexterity;Flexibility;Strength;ROM;Balance;Coordination;FMC;IADL;Endurance;UE functional use;Decreased knowledge of use of DME;GMC    Psychosocial Skills Environmental  Adaptations;Habits;Routines and Behaviors    Rehab Potential Fair    Clinical Decision Making Limited treatment options, no task modification necessary  Comorbidities Affecting Occupational Performance: Presence of comorbidities impacting occupational performance    Comorbidities impacting occupational performance description: contractures of bilateral hands, dependent transfers,    Modification or Assistance to Complete Evaluation  No modification of tasks or assist necessary to complete eval    OT Frequency 2x / week    OT Duration 12 weeks    OT Treatment/Interventions Self-care/ADL training;Cryotherapy;Therapeutic exercise;DME and/or AE instruction;Balance training;Neuromuscular education;Manual Therapy;Splinting;Moist Heat;Passive range of motion;Therapeutic activities;Patient/family education    Consulted and Agree with Plan of Care Patient           Patient will benefit from skilled therapeutic intervention in order to improve the following deficits and impairments:   Body Structure / Function / Physical Skills: ADL,Continence,Dexterity,Flexibility,Strength,ROM,Balance,Coordination,FMC,IADL,Endurance,UE functional use,Decreased knowledge of use of [facility]   Psychosocial Skills: Environmental   Adaptations,Habits,Routines and Behaviors   Visit Diagnosis: Muscle weakness (generalized)  Other lack of coordination    Problem List Patient Active Problem List   Diagnosis Date Noted  . Acute appendicitis with appendiceal abscess 05/30/2018  . Hypocalcemia 02/15/2018  . Dysuria 02/15/2018  . Bilateral lower extremity edema 02/11/2018  . Vaginal yeast infection 01/30/2018  . Chest tightness 01/27/2018  . Healthcare-associated pneumonia 01/25/2018  . Pleural effusion, not elsewhere classified 01/25/2018  . Acute deep vein thrombosis (DVT) of left lower extremity ([place]) 01/24/2018  . Acute deep vein thrombosis (DVT) of right lower extremity ([place]) 01/24/2018  . Chronic allergic rhinitis 01/24/2018  . Depression with anxiety 01/24/2018  . UTI due to Klebsiella species 01/24/2018  . Protein-calorie malnutrition, severe ([place]) 01/24/2018  . S/P insertion of IVC (inferior vena caval) filter 01/24/2018  . Reactive depression   . Hypokalemia   . Leukocytosis   . Essential hypertension   . Trauma   . Tetraparesis ([place])   . Neuropathic pain   . Neurogenic bowel   . Neurogenic bladder   . Benign essential HTN   . Acute blood loss anemia   . Central cord syndrome at C6 level of cervical spinal cord ([place]) 11/29/2017  . Central cord syndrome [facility]) 11/29/2017    Harrel Carina, MS, OTR/L 07/02/2020, 1:08 PM  [place] MAIN [facility] SERVICES [address] [place], [place], [address] Phone: [phone]   Fax:  [phone]  Name: BERNYCE BRIMLEY MRN: [id number] Date of Birth: [birth year]

## 2020-07-07 ENCOUNTER — Ambulatory Visit: Payer: Medicare HMO | Admitting: Occupational Therapy

## 2020-07-07 ENCOUNTER — Other Ambulatory Visit: Payer: Self-pay

## 2020-07-07 ENCOUNTER — Encounter: Payer: Self-pay | Admitting: Occupational Therapy

## 2020-07-07 ENCOUNTER — Ambulatory Visit: Payer: Medicare HMO

## 2020-07-07 DIAGNOSIS — M6281 Muscle weakness (generalized): Secondary | ICD-10-CM | POA: Diagnosis not present

## 2020-07-07 DIAGNOSIS — R269 Unspecified abnormalities of gait and mobility: Secondary | ICD-10-CM

## 2020-07-07 DIAGNOSIS — R278 Other lack of coordination: Secondary | ICD-10-CM

## 2020-07-07 DIAGNOSIS — R262 Difficulty in walking, not elsewhere classified: Secondary | ICD-10-CM

## 2020-07-07 NOTE — Therapy (Signed)
[place] MAIN [facility] SERVICES [address] [place], [place], [address] Phone: [phone]   Fax:  [phone]  Occupational Therapy Treatment  Patient Details  Name: Sherry [name] MRN: [id number] Date of Birth: 05-Nov-1934 No data recorded  Encounter Date: 07/07/2020   OT End of Session - 07/07/20 1303    Visit Number 45    Number of Visits 62    Date for OT Re-Evaluation 07/30/20    Authorization Time Period Progress report period starting 05/07/2020    OT Start Time 1100    OT Stop Time 1145    OT Time Calculation (min) 45 min    Activity Tolerance Patient tolerated treatment well    Behavior During Therapy [facility] for tasks assessed/performed           Past Medical History:  Diagnosis Date  . Acute blood loss anemia   . Arthritis   . Cancer ([place])    skin  . Central cord syndrome at C6 level of cervical spinal cord ([place]) 11/29/2017  . Hypertension   . Protein-calorie malnutrition, severe ([place]) 01/24/2018  . S/P insertion of IVC (inferior vena caval) filter 01/24/2018  . Tetraparesis [facility])     Past Surgical History:  Procedure Laterality Date  . ANTERIOR CERVICAL DECOMP/DISCECTOMY FUSION N/A 11/29/2017   Procedure: Cervical five-six, six-seven Anterior Cervical Decompression Fusion;  Surgeon: Judith Part, MD;  Location: [place];  Service: Neurosurgery;  Laterality: N/A;  Cervical five-six, six-seven Anterior Cervical Decompression Fusion  . CATARACT EXTRACTION    . IR IVC FILTER PLMT / S&I /IMG GUID/MOD SED  12/08/2017    There were no vitals filed for this visit.  OT Treatment  Pt.tolerated bilateral shoulder flexion, extension, abduction, elbow flexion, extension forearm supination AAROM/AROM. PROM bilateral wrist extension, digit MP, PIP, and DIP flexion, and extension in conjunction with moist heat.Pt. worked on bilateral UE reaching with the right and Left UE using the horizontal [employer]. Pt.worked on reaching  with incorporating her core to improve trunk control while reaching up, and sliding them to the right through3horizontal rungsto the right, and 3 rungs to the leftx's1rep each, and through 2 horizontal rungs with each the right, and left UEs.  Pt. Reports that she has not slept well in 3 weeks because of the air conditioning in her room. The strap on the left foot brace has worn thin. Pt. continues to make progress overall, and is improving with BUE ROM, and functional reaching.Pt. presents with no pain today, and was able to tolerate ROM, moist heat modality, and functional reaching without pain.Pt.was able to move the [place] rings through 3 horizontal rungs with both the right, and left UEstodayx's 2 reps eachwith her right, and left UE's, and through 2 horizontal rungs with each UE.Pt.continues toprogress with her RUE, andhand whenperforming self-feeding with adpativeequipment.Pt.continues to beable to use her right thumb to hold items. Pt.continues to benefit from working on improving ROM in order to work towards increasing engagement of her bilateral hands during ADLs, and IADL tasks.                        OT Education - 07/07/20 1302    Education Details BUE ROM with functional reaching    Person(s) Educated Patient;Caregiver(s)    Methods Explanation;Demonstration    Comprehension Verbalized understanding;Need further instruction               OT Long Term Goals - 06/16/20 1305  OT LONG TERM GOAL #1   Title Patient and caregiver will demonstrate understanding of home exercise program for ROM.    Baseline Pt. continues to have a restorative aide rehab aide assist her wih exercises at [facility]    Time 12    Period Weeks    Status On-going    Target Date 07/30/20      OT LONG TERM GOAL #4   Title Patient will demonstrate improved composite finger flexion to hold deodorant to apply to underarms.    Baseline Pt. continues to present  with improving finger flexion of R hand for a partial gross grasp, but unable to grasp and hold items. Very limited digit flexion of L hand. Pt. has active left thumb abduction.    Time 12    Period Weeks    Status On-going    Target Date 07/30/20      OT LONG TERM GOAL #6   Title Patient will increase right UE ROM to comb the back of her hair with modified independence.    Baseline Pt. continues to be able to comb the front, and sides of her head. Pt. can reach the back, however cannot see what she is doing in the back of her head.    Time 12    Period Weeks    Status Partially Met    Target Date 07/30/20      OT LONG TERM GOAL #8   Title Pt. will write, and sign her name with 100% legibility, and modified independence.    Baseline Pt. continues to  consistently filling out her daily menu, and writing for crossword puzzles. Pt. is able to maintain grasp on a wide width pen. [place] to work on [employer].    Time 12    Period Weeks    Status On-going    Target Date 07/30/20                 Plan - 07/07/20 1303    Clinical Impression Statement Pt. Reports that she has not slept well in 3 weeks because of the air conditioning in her room. The strap on the left foot brace has worn thin. Pt. continues to make progress overall, and is improving with BUE ROM, and functional reaching.Pt. presents with no pain today, and was able to tolerate ROM, moist heat modality, and functional reaching without pain.Pt.was able to move the [employer] rings through 3 horizontal rungs with both the right, and left UEstodayx's 2 reps eachwith her right, and left UE's, and through 2 horizontal rungs with each UE.Pt.continues toprogress with her RUE, andhand whenperforming self-feeding with adpativeequipment.Pt.continues to beable to use her right thumb to hold items. Pt.continues to benefit from working on improving ROM in order to work towards increasing engagement of her bilateral  hands during ADLs, and IADL tasks.     OT Occupational Profile and History Detailed Assessment- Review of Records and additional review of physical, cognitive, psychosocial history related to current functional performance    Occupational performance deficits (Please refer to evaluation for details): ADL's;IADL's;Leisure;Social Participation    Body Structure / Function / Physical Skills ADL;Continence;Dexterity;Flexibility;Strength;ROM;Balance;Coordination;FMC;IADL;Endurance;UE functional use;Decreased knowledge of use of DME;GMC    Psychosocial Skills Environmental  Adaptations;Habits;Routines and Behaviors    Rehab Potential Fair    Clinical Decision Making Limited treatment options, no task modification necessary    Comorbidities Affecting Occupational Performance: Presence of comorbidities impacting occupational performance    Modification or Assistance to Complete Evaluation  No modification of  tasks or assist necessary to complete eval    OT Frequency 2x / week    OT Duration 12 weeks    OT Treatment/Interventions Self-care/ADL training;Cryotherapy;Therapeutic exercise;DME and/or AE instruction;Balance training;Neuromuscular education;Manual Therapy;Splinting;Moist Heat;Passive range of motion;Therapeutic activities;Patient/family education    Consulted and Agree with Plan of Care Patient           Patient will benefit from skilled therapeutic intervention in order to improve the following deficits and impairments:   Body Structure / Function / Physical Skills: ADL,Continence,Dexterity,Flexibility,Strength,ROM,Balance,Coordination,FMC,IADL,Endurance,UE functional use,Decreased knowledge of use of [facility]   Psychosocial Skills: Environmental  Adaptations,Habits,Routines and Behaviors   Visit Diagnosis: Muscle weakness (generalized)  Other lack of coordination    Problem List Patient Active Problem List   Diagnosis Date Noted  . Acute appendicitis with appendiceal abscess  05/30/2018  . Hypocalcemia 02/15/2018  . Dysuria 02/15/2018  . Bilateral lower extremity edema 02/11/2018  . Vaginal yeast infection 01/30/2018  . Chest tightness 01/27/2018  . Healthcare-associated pneumonia 01/25/2018  . Pleural effusion, not elsewhere classified 01/25/2018  . Acute deep vein thrombosis (DVT) of left lower extremity ([place]) 01/24/2018  . Acute deep vein thrombosis (DVT) of right lower extremity ([place]) 01/24/2018  . Chronic allergic rhinitis 01/24/2018  . Depression with anxiety 01/24/2018  . UTI due to Klebsiella species 01/24/2018  . Protein-calorie malnutrition, severe ([place]) 01/24/2018  . S/P insertion of IVC (inferior vena caval) filter 01/24/2018  . Reactive depression   . Hypokalemia   . Leukocytosis   . Essential hypertension   . Trauma   . Tetraparesis ([place])   . Neuropathic pain   . Neurogenic bowel   . Neurogenic bladder   . Benign essential HTN   . Acute blood loss anemia   . Central cord syndrome at C6 level of cervical spinal cord ([place]) 11/29/2017  . Central cord syndrome [facility]) 11/29/2017    Harrel Carina, MS, OTR/L 07/07/2020, 1:04 PM  [place] MAIN [facility] SERVICES [address] [place], [place], [address] Phone: [phone]   Fax:  [phone]  Name: Sherry [name] MRN: [id number] Date of Birth: 1934-07-17

## 2020-07-07 NOTE — Therapy (Signed)
[place] MAIN [facility] SERVICES [address] [place], [place], [address] Phone: [phone]   Fax:  [phone]  Physical Therapy Treatment/Physical Therapy Progress Note   Dates of reporting period  05/28/2020 to   07/07/2020  Patient Details  Name: Sherry [name] MRN: [id number] Date of Birth: 25-Sep-1934 Referring Provider (PT): Viviana Simpler   Encounter Date: 07/07/2020   PT End of Session - 07/07/20 1153    Visit Number 70    Number of Visits 104    Date for PT Re-Evaluation 09/24/20    Authorization Type [employer] FOTO performed by PT on eval (7/20), score 12, Progress note on 03/10/2020    Authorization Time Period Recert 0/02/7492- 4/96/7591; PN on 07/07/2020    PT Start Time 1145    PT Stop Time 1216    PT Time Calculation (min) 31 min    Equipment Utilized During Treatment Gait belt    Activity Tolerance Patient tolerated treatment well;Patient limited by fatigue    Behavior During Therapy WFL for tasks assessed/performed           Past Medical History:  Diagnosis Date  . Acute blood loss anemia   . Arthritis   . Cancer ([place])    skin  . Central cord syndrome at C6 level of cervical spinal cord ([place]) 11/29/2017  . Hypertension   . Protein-calorie malnutrition, severe ([place]) 01/24/2018  . S/P insertion of IVC (inferior vena caval) filter 01/24/2018  . Tetraparesis [facility])     Past Surgical History:  Procedure Laterality Date  . ANTERIOR CERVICAL DECOMP/DISCECTOMY FUSION N/A 11/29/2017   Procedure: Cervical five-six, six-seven Anterior Cervical Decompression Fusion;  Surgeon: Judith Part, MD;  Location: [place];  Service: Neurosurgery;  Laterality: N/A;  Cervical five-six, six-seven Anterior Cervical Decompression Fusion  . CATARACT EXTRACTION    . IR IVC FILTER PLMT / S&I /IMG GUID/MOD SED  12/08/2017    There were no vitals filed for this visit.   Subjective Assessment - 07/07/20 1149    Subjective Patient  reports that she has not slept well in 3 nights and not feeling well today.    Pertinent History Pt is an 86 yo female that fell in 2019, fractured vertebrae in her neck and in her low back per family. 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 After hospital stay, pt discharged to [facility] for ~1 month, experienced severe UTI as well as bilateral DVT (IVC filter placed). Discharged to [facility], stayed for about a year, and then moved to [facility] in April 2021. Was receiving PT, but per family facility reported that did not have adequate equipment to maximize PT for her. Pt until about 1 month ago was practicing sit to stand transfers with 1-2 people, and working on static standing. Has a brace for L foot due to PF. Pt currently needs assistance with all ADLs (able to assist with donning/doffing her shirt), bed baths, and needs a hoyer lift for transfers. Able to drive power wheelchair without assistance.    Limitations Standing;Walking;House hold activities;Lifting    How long can you sit comfortably? NA    How long can you stand comfortably? unable    How long can you walk comfortably? unable    Patient Stated Goals "to stand up and walk"    Currently in Pain? No/denies           Interventions:  *Patient requested to only perform seated LE strengthening secondary to not feeling well.  Patient performed seated therex :  Seated hip march (minimal ability to lift either leg against gravity)  Seated knee ext (no add weight on right LE) and 3lb on left LE Seated Hip abd  Seated hip add squeeze with 5 sec hold Gluteal sets with 5 sec hold 2 sets of 10 reps each LE (AAROM with right Hip ABD)  Verbal cues to slow down and count out loud for proper breathing.   Clinical Impression: Patient limited to seated LE strengthening due to not feeling well. She was able to complete 2 rounds of 10 reps despite not feeling well with additional rest breaks. Patient was just recertified  last visit so did not recheck goals for progress note. Patient's condition has the potential to improve in response to therapy. Maximum improvement is yet to be obtained. The anticipated improvement is attainable and reasonable in a generally predictable time.                        PT Education - 07/07/20 1152    Education Details specific exercise form    Person(s) Educated Patient    Methods Explanation;Demonstration;Tactile cues;Verbal cues    Comprehension Verbalized understanding;Returned demonstration;Need further instruction;Verbal cues required            PT Short Term Goals - 03/10/20 2211      PT SHORT TERM GOAL #1   Title The patient will perform initial HEP with minimum assistance in order to improve strength and function.    Baseline Patient demonstrating independence with initial HEP as of 03/10/2020 with no questions or difficulty.    Time 4    Period Weeks    Status Achieved    Target Date 02/11/20             PT Long Term Goals - 07/02/20 1734      PT LONG TERM GOAL #1   Title The patient will be compliant with finalized HEP with minimum assistance in preparation for self management and maintenance of condition.    Baseline 03/10/2020- Patient familiar with initial HEP and will keep goal active as HEP becomes more progressive including possible transfers and standing activities. 04/07/2020- Patient continues to report compliance with LE strengthening home exercises without questions or concerns. 04/21/2020- Patient able to verbalize and demonstrate good understanding of current HEP including some supine and seated LE strengthening exercises. Reviewed and patient performed 10 reps today with minimal cueing. Will keep goal active to progress HEP as appropriate. 05/28/2020- patient reports continues to perform LE strengthening as able and some help from caregiver as able. No changes at this time to home program as patient is limited to supine/seated  therex. 07/02/2020- Patient continues to report compliance with current supine and seated LE strengtheing home program and states no questions or concerns regarding her plan.    Time 12    Period Weeks    Status Achieved      PT LONG TERM GOAL #2   Title The patient will demonstrate at least 10 point improvement on FOTO score indicating an improved ability to perform functional activities.    Baseline on eval (/720) score 12; 10/22/19: 12; 12/10/19: 18, 12/13: 18: 04/07/2020- Will assess next visit. 07/02/2020- Will obtain next visit.    Time 12    Period Weeks    Status On-going    Target Date 09/24/20      PT LONG TERM GOAL #3   Title The patient will demonstrate lateral  scooting  for assistance with transfers with minA to maximize independence.    Baseline 10/22/19: Pt requires modA+1 for lateral scooting; 12/10/19: modA+1, 12/13: modA-maxA, 12/27: modA-maxA, 03/10/2020- ModA-MaxA- increased verbal cues and visual demonstration. 04/07/2020- Continued Max assist to perform forward/lateral scoot. 04/21/2020- Patient was able to demo slight lateral scoot with CGA approx 12 in then fatigued requiring max assist- she is able to scoot forward by leaning without significant issues. 05/28/2020- While sitting in chair- patient able to perform forward and lateral scoot with VC/visual demo and increased time allowed- CGA today but not consistent yet- will keep goal active at this time to continue to focus on strengthening and improving technique. 07/02/2020- Patient able to demonstrate minimal ability to laterally shift weigh on mat table requiring max assist yet is able to forward scoot out to edge of mat table with min Assist.    Time 12    Period Weeks    Status On-going    Target Date 09/24/20      PT LONG TERM GOAL #4   Title The patient will demonstrate a squat pivot transfer with minimum assistance to maximize independence and mobility. 07/02/2020- Patient is able to perform a stand pivot transfer with max  assist- was able to minimally pivot on left LE today with transfer.    Baseline 10/22/19: unable to safely attempt at this time; 12/10/19: unable to safely attempt at this time, 01/14/20: unable to safety attempt at this time. 03/10/2020- Patient able to perform Stand pivot transfer with Maximal assist. 04/07/2020- Patient continues to require Max assist with sit to stand- will assist SPT next visit. 04/21/2020- Patient presents with Max assist to perform Sit to stand Transfer- unable to pivot or move Left LE well without difficulty.    Time 12    Period Weeks    Status On-going    Target Date 09/24/20      PT LONG TERM GOAL #5   Title The patient will demonstrate sitting without UE support for 2-5 minutes at EOB to improve participation and maximize independence with ADLs.    Baseline 10/22/19: Pt able to sit unsupported for at least 2 minutes at edge of bed. 04/21/2020- Patient continues to demo good sitting at edge of mat > 5 miin without difficulty or back support.    Time 12    Period Weeks    Status Achieved      PT LONG TERM GOAL #6   Title Patient will demonstrate improved functional LE strength as seen by consistent ability to stand > 2 min (3 out of 4 trials) with Mod/Max A for improved LE strength with transfers.    Baseline 05/28/2020- Patient able to inconsistent stand 1-2 min right now in //bars with Mod/Max A. 07/02/2020- Patient was able to progress last 2 visit to standing using bilateral platform attachment between 1-2 min which is a progression from the parallel bars.    Time 12    Period Weeks    Status On-going    Target Date 09/24/20                 Plan - 07/07/20 1153    Clinical Impression Statement Patient limited to seated LE strengthening due to not feeling well. She was able to complete 2 rounds of 10 reps despite not feeling well with additional rest breaks. Patient was just recertified last visit so did not recheck goals for progress note. Patient's condition has  the potential to improve in response to therapy. Maximum  improvement is yet to be obtained. The anticipated improvement is attainable and reasonable in a generally predictable time.    Personal Factors and Comorbidities Age;Time since onset of injury/illness/exacerbation;Comorbidity 3+;Fitness    Comorbidities HTN, quadriparesis, history of DVT, neurogenic bladder    Examination-Activity Limitations Bathing;Hygiene/Grooming;Squat;Bed Mobility;Lift;Bend;Stand;Engineer, manufacturing;Toileting;Self Feeding;Transfers;Continence;Sit;Dressing;Sleep;Carry    Examination-Participation Restrictions Church;Laundry;Cleaning;Medication Management;Community Activity;Meal Prep;Interpersonal Relationship    Stability/Clinical Decision Making Evolving/Moderate complexity    Rehab Potential Fair    PT Frequency 2x / week    PT Duration 12 weeks    PT Treatment/Interventions ADLs/Self Care Home Management;Electrical Stimulation;Therapeutic activities;Wheelchair mobility training;Vasopneumatic Device;Joint Manipulations;Vestibular;Passive range of motion;Patient/family education;Therapeutic exercise;DME Instruction;Biofeedback;Aquatic Therapy;Moist Heat;Gait training;Balance training;Orthotic Fit/Training;Dry needling;Energy conservation;Taping;Splinting;Neuromuscular re-education;Cryotherapy;Ultrasound;Functional mobility training    PT Next Visit Plan Continue with progressive Standing/transfer training.    PT Home Exercise Plan no changes    Consulted and Agree with Plan of Care Patient           Patient will benefit from skilled therapeutic intervention in order to improve the following deficits and impairments:  Abnormal gait,Decreased balance,Decreased endurance,Decreased mobility,Difficulty walking,Hypomobility,Impaired sensation,Decreased range of motion,Improper body mechanics,Impaired perceived functional ability,Decreased activity tolerance,Decreased knowledge of use of DME,Decreased safety  awareness,Decreased strength,Impaired flexibility,Impaired UE functional use,Postural dysfunction  Visit Diagnosis: Abnormality of gait and mobility  Difficulty in walking, not elsewhere classified  Muscle weakness (generalized)  Other lack of coordination     Problem List Patient Active Problem List   Diagnosis Date Noted  . Acute appendicitis with appendiceal abscess 05/30/2018  . Hypocalcemia 02/15/2018  . Dysuria 02/15/2018  . Bilateral lower extremity edema 02/11/2018  . Vaginal yeast infection 01/30/2018  . Chest tightness 01/27/2018  . Healthcare-associated pneumonia 01/25/2018  . Pleural effusion, not elsewhere classified 01/25/2018  . Acute deep vein thrombosis (DVT) of left lower extremity ([place]) 01/24/2018  . Acute deep vein thrombosis (DVT) of right lower extremity ([place]) 01/24/2018  . Chronic allergic rhinitis 01/24/2018  . Depression with anxiety 01/24/2018  . UTI due to Klebsiella species 01/24/2018  . Protein-calorie malnutrition, severe ([place]) 01/24/2018  . S/P insertion of IVC (inferior vena caval) filter 01/24/2018  . Reactive depression   . Hypokalemia   . Leukocytosis   . Essential hypertension   . Trauma   . Tetraparesis ([place])   . Neuropathic pain   . Neurogenic bowel   . Neurogenic bladder   . Benign essential HTN   . Acute blood loss anemia   . Central cord syndrome at C6 level of cervical spinal cord ([place]) 11/29/2017  . Central cord syndrome [facility]) 11/29/2017    Lewis Moccasin, PT 07/07/2020, 3:15 PM  [place] MAIN [facility] SERVICES [address] [place], [place], [address] Phone: [phone]   Fax:  [phone]  Name: Sherry [name] MRN: [id number] Date of Birth: 23-Jul-1934

## 2020-07-09 ENCOUNTER — Ambulatory Visit: Payer: Medicare HMO

## 2020-07-09 ENCOUNTER — Ambulatory Visit: Payer: Medicare HMO | Admitting: Occupational Therapy

## 2020-07-11 DIAGNOSIS — M79672 Pain in left foot: Secondary | ICD-10-CM

## 2020-07-11 DIAGNOSIS — M79671 Pain in right foot: Secondary | ICD-10-CM

## 2020-07-11 DIAGNOSIS — B351 Tinea unguium: Secondary | ICD-10-CM

## 2020-07-14 ENCOUNTER — Ambulatory Visit: Payer: Medicare HMO | Admitting: Occupational Therapy

## 2020-07-14 ENCOUNTER — Ambulatory Visit: Payer: Medicare HMO

## 2020-07-14 ENCOUNTER — Other Ambulatory Visit: Payer: Self-pay

## 2020-07-14 DIAGNOSIS — R269 Unspecified abnormalities of gait and mobility: Secondary | ICD-10-CM

## 2020-07-14 DIAGNOSIS — R278 Other lack of coordination: Secondary | ICD-10-CM

## 2020-07-14 DIAGNOSIS — M6281 Muscle weakness (generalized): Secondary | ICD-10-CM

## 2020-07-14 DIAGNOSIS — R262 Difficulty in walking, not elsewhere classified: Secondary | ICD-10-CM

## 2020-07-14 NOTE — Therapy (Signed)
[place] MAIN [facility] SERVICES [address] [place], [place], [address] Phone: [phone]   Fax:  [phone]  Physical Therapy Treatment  Patient Details  Name: Sherry [name] MRN: [id number] Date of Birth: 1934/09/30 Referring Provider (PT): Viviana Simpler   Encounter Date: 07/14/2020   PT End of Session - 07/14/20 1726     Visit Number 71    Number of Visits 104    Date for PT Re-Evaluation 09/24/20    Authorization Type [employer]na medicare FOTO performed by PT on eval (7/20), score 12, Progress note on 03/10/2020    Authorization Time Period Recert 10/09/9209- 9/41/7408; PN on 07/07/2020    PT Start Time 1147    PT Stop Time 1225    PT Time Calculation (min) 38 min    Equipment Utilized During Treatment Gait belt    Activity Tolerance Patient tolerated treatment well;Patient limited by fatigue    Behavior During Therapy WFL for tasks assessed/performed             Past Medical History:  Diagnosis Date   Acute blood loss anemia    Arthritis    Cancer ([place])    skin   Central cord syndrome at C6 level of cervical spinal cord ([place]) 11/29/2017   Hypertension    Protein-calorie malnutrition, severe ([place]) 01/24/2018   S/P insertion of IVC (inferior vena caval) filter 01/24/2018   Tetraparesis ([place])     Past Surgical History:  Procedure Laterality Date   ANTERIOR CERVICAL DECOMP/DISCECTOMY FUSION N/A 11/29/2017   Procedure: Cervical five-six, six-seven Anterior Cervical Decompression Fusion;  Surgeon: Judith Part, MD;  Location: [place];  Service: Neurosurgery;  Laterality: N/A;  Cervical five-six, six-seven Anterior Cervical Decompression Fusion   CATARACT EXTRACTION     IR IVC FILTER PLMT / S&I /IMG GUID/MOD SED  12/08/2017    There were no vitals filed for this visit.   Subjective Assessment - 07/14/20 1725     Subjective Patient reports feeling better today versus last week. Agreeable to try to stand using platform  walker.    Pertinent History Pt is an 86 yo female that fell in 2019, fractured vertebrae in her neck and in her low back per family. 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 After hospital stay, pt discharged to [facility] for ~1 month, experienced severe UTI as well as bilateral DVT (IVC filter placed). Discharged to [facility], stayed for about a year, and then moved to [facility] in April 2021. Was receiving PT, but per family facility reported that did not have adequate equipment to maximize PT for her. Pt until about 1 month ago was practicing sit to stand transfers with 1-2 people, and working on static standing. Has a brace for L foot due to PF. Pt currently needs assistance with all ADLs (able to assist with donning/doffing her shirt), bed baths, and needs a hoyer lift for transfers. Able to drive power wheelchair without assistance.    Limitations Standing;Walking;House hold activities;Lifting    How long can you sit comfortably? NA    How long can you stand comfortably? unable    How long can you walk comfortably? unable    Patient Stated Goals "to stand up and walk"    Currently in Pain? No/denies              Interventions:   Patient performed active BLE hip march and knee ext 2 sets of 10 reps - Fatigues quickly with RLE with  minimal height but able to extend knee well today without significant difficulty.    Transfer training- Patient performed stand pivot transfer from lift chair to mat table - max assist to stand and pivot- yet patient was able to move her left LE slightly and able to maintain standing with only mod A today.   Static stand using Bilateral Platfom attachment- x 4 trials ranging from 38min 7 sec to 1 min 38 sec today- Continuing to focus on extending trunk/hips and weight shifting forward to allow improved center of gravity. Utilized VC/TC and repetition for compliance. Patient was also able to move right LE medially and laterally today and maintain  standing but unable to move her LE secondary to difficulty weightbearing through Right LE  Clinical Impression: Patient continues to require max assist to stand but once in standing- able to maintain with moderate assist. She requires VC/TC/repetition to perform static standing and transfers today with good activity tolerance. She was able to move her right LE for the first time today and patient reports excited for any new movement as she feels she is closer to attempting to walk/pivot. Patient will benefit from continued PT services to increase mobility and strength to improve patient's quality of life.                         PT Education - 07/14/20 1726     Education Details standing postural form    Person(s) Educated Patient    Methods Explanation;Demonstration;Tactile cues;Verbal cues    Comprehension Verbalized understanding;Returned demonstration;Verbal cues required;Tactile cues required;Need further instruction              PT Short Term Goals - 03/10/20 2211       PT SHORT TERM GOAL #1   Title The patient will perform initial HEP with minimum assistance in order to improve strength and function.    Baseline Patient demonstrating independence with initial HEP as of 03/10/2020 with no questions or difficulty.    Time 4    Period Weeks    Status Achieved    Target Date 02/11/20               PT Long Term Goals - 07/02/20 1734       PT LONG TERM GOAL #1   Title The patient will be compliant with finalized HEP with minimum assistance in preparation for self management and maintenance of condition.    Baseline 03/10/2020- Patient familiar with initial HEP and will keep goal active as HEP becomes more progressive including possible transfers and standing activities. 04/07/2020- Patient continues to report compliance with LE strengthening home exercises without questions or concerns. 04/21/2020- Patient able to verbalize and demonstrate good  understanding of current HEP including some supine and seated LE strengthening exercises. Reviewed and patient performed 10 reps today with minimal cueing. Will keep goal active to progress HEP as appropriate. 05/28/2020- patient reports continues to perform LE strengthening as able and some help from caregiver as able. No changes at this time to home program as patient is limited to supine/seated therex. 07/02/2020- Patient continues to report compliance with current supine and seated LE strengtheing home program and states no questions or concerns regarding her plan.    Time 12    Period Weeks    Status Achieved      PT LONG TERM GOAL #2   Title The patient will demonstrate at least 10 point improvement on FOTO score indicating an improved  ability to perform functional activities.    Baseline on eval (/720) score 12; 10/22/19: 12; 12/10/19: 18, 12/13: 18: 04/07/2020- Will assess next visit. 07/02/2020- Will obtain next visit.    Time 12    Period Weeks    Status On-going    Target Date 09/24/20      PT LONG TERM GOAL #3   Title The patient will demonstrate lateral scooting  for assistance with transfers with minA to maximize independence.    Baseline 10/22/19: Pt requires modA+1 for lateral scooting; 12/10/19: modA+1, 12/13: modA-maxA, 12/27: modA-maxA, 03/10/2020- ModA-MaxA- increased verbal cues and visual demonstration. 04/07/2020- Continued Max assist to perform forward/lateral scoot. 04/21/2020- Patient was able to demo slight lateral scoot with CGA approx 12 in then fatigued requiring max assist- she is able to scoot forward by leaning without significant issues. 05/28/2020- While sitting in chair- patient able to perform forward and lateral scoot with VC/visual demo and increased time allowed- CGA today but not consistent yet- will keep goal active at this time to continue to focus on strengthening and improving technique. 07/02/2020- Patient able to demonstrate minimal ability to laterally shift weigh on mat  table requiring max assist yet is able to forward scoot out to edge of mat table with min Assist.    Time 12    Period Weeks    Status On-going    Target Date 09/24/20      PT LONG TERM GOAL #4   Title The patient will demonstrate a squat pivot transfer with minimum assistance to maximize independence and mobility. 07/02/2020- Patient is able to perform a stand pivot transfer with max assist- was able to minimally pivot on left LE today with transfer.    Baseline 10/22/19: unable to safely attempt at this time; 12/10/19: unable to safely attempt at this time, 01/14/20: unable to safety attempt at this time. 03/10/2020- Patient able to perform Stand pivot transfer with Maximal assist. 04/07/2020- Patient continues to require Max assist with sit to stand- will assist SPT next visit. 04/21/2020- Patient presents with Max assist to perform Sit to stand Transfer- unable to pivot or move Left LE well without difficulty.    Time 12    Period Weeks    Status On-going    Target Date 09/24/20      PT LONG TERM GOAL #5   Title The patient will demonstrate sitting without UE support for 2-5 minutes at EOB to improve participation and maximize independence with ADLs.    Baseline 10/22/19: Pt able to sit unsupported for at least 2 minutes at edge of bed. 04/21/2020- Patient continues to demo good sitting at edge of mat > 5 miin without difficulty or back support.    Time 12    Period Weeks    Status Achieved      PT LONG TERM GOAL #6   Title Patient will demonstrate improved functional LE strength as seen by consistent ability to stand > 2 min (3 out of 4 trials) with Mod/Max A for improved LE strength with transfers.    Baseline 05/28/2020- Patient able to inconsistent stand 1-2 min right now in //bars with Mod/Max A. 07/02/2020- Patient was able to progress last 2 visit to standing using bilateral platform attachment between 1-2 min which is a progression from the parallel bars.    Time 12    Period Weeks     Status On-going    Target Date 09/24/20  Plan - 07/14/20 1727     Clinical Impression Statement Patient continues to require max assist to stand but once in standing- able to maintain with moderate assist. She requires VC/TC/repetition to perform static standing and transfers today with good activity tolerance. She was able to move her right LE for the first time today and patient reports excited for any new movement as she feels she is closer to attempting to walk/pivot. Patient will benefit from continued PT services to increase mobility and strength to improve patient's quality of life.    Personal Factors and Comorbidities Age;Time since onset of injury/illness/exacerbation;Comorbidity 3+;Fitness    Comorbidities HTN, quadriparesis, history of DVT, neurogenic bladder    Examination-Activity Limitations Bathing;Hygiene/Grooming;Squat;Bed Mobility;Lift;Bend;Stand;Engineer, manufacturing;Toileting;Self Feeding;Transfers;Continence;Sit;Dressing;Sleep;Carry    Examination-Participation Restrictions Church;Laundry;Cleaning;Medication Management;Community Activity;Meal Prep;Interpersonal Relationship    Stability/Clinical Decision Making Evolving/Moderate complexity    Rehab Potential Fair    PT Frequency 2x / week    PT Duration 12 weeks    PT Treatment/Interventions ADLs/Self Care Home Management;Electrical Stimulation;Therapeutic activities;Wheelchair mobility training;Vasopneumatic Device;Joint Manipulations;Vestibular;Passive range of motion;Patient/family education;Therapeutic exercise;DME Instruction;Biofeedback;Aquatic Therapy;Moist Heat;Gait training;Balance training;Orthotic Fit/Training;Dry needling;Energy conservation;Taping;Splinting;Neuromuscular re-education;Cryotherapy;Ultrasound;Functional mobility training    PT Next Visit Plan Continue with progressive Standing/transfer training.    PT Home Exercise Plan no changes    Consulted and Agree with Plan  of Care Patient             Patient will benefit from skilled therapeutic intervention in order to improve the following deficits and impairments:  Abnormal gait, Decreased balance, Decreased endurance, Decreased mobility, Difficulty walking, Hypomobility, Impaired sensation, Decreased range of motion, Improper body mechanics, Impaired perceived functional ability, Decreased activity tolerance, Decreased knowledge of use of DME, Decreased safety awareness, Decreased strength, Impaired flexibility, Impaired UE functional use, Postural dysfunction  Visit Diagnosis: Abnormality of gait and mobility  Difficulty in walking, not elsewhere classified  Muscle weakness (generalized)  Other lack of coordination     Problem List Patient Active Problem List   Diagnosis Date Noted   Acute appendicitis with appendiceal abscess 05/30/2018   Hypocalcemia 02/15/2018   Dysuria 02/15/2018   Bilateral lower extremity edema 02/11/2018   Vaginal yeast infection 01/30/2018   Chest tightness 01/27/2018   Healthcare-associated pneumonia 01/25/2018   Pleural effusion, not elsewhere classified 01/25/2018   Acute deep vein thrombosis (DVT) of left lower extremity ([place]) 01/24/2018   Acute deep vein thrombosis (DVT) of right lower extremity ([place]) 01/24/2018   Chronic allergic rhinitis 01/24/2018   Depression with anxiety 01/24/2018   UTI due to Klebsiella species 01/24/2018   Protein-calorie malnutrition, severe ([place]) 01/24/2018   S/P insertion of IVC (inferior vena caval) filter 01/24/2018   Reactive depression    Hypokalemia    Leukocytosis    Essential hypertension    Trauma    Tetraparesis ([facility])    Neuropathic pain    Neurogenic bowel    Neurogenic bladder    Benign essential HTN    Acute blood loss anemia    Central cord syndrome at C6 level of cervical spinal cord ([place]) 11/29/2017   Central cord syndrome ([place]) 11/29/2017    Lewis Moccasin, PT 07/15/2020, 3:07 PM  [place] MAIN [facility] SERVICES [address] [place], [place], [address] Phone: [phone]   Fax:  [phone]  Name: LYLIAN SANAGUSTIN MRN: [id number] Date of Birth: 10-Jul-1934

## 2020-07-14 NOTE — Therapy (Signed)
[place] MAIN [facility] SERVICES [address] [place], [place], [address] Phone: [phone]   Fax:  [phone]  Occupational Therapy Treatment  Patient Details  Name: Sherry [name] MRN: [id number] Date of Birth: 1934/05/19 No data recorded  Encounter Date: 07/14/2020   OT End of Session - 07/14/20 1106     Visit Number 46    Number of Visits 47    Date for OT Re-Evaluation 07/30/20    Authorization Time Period Progress report period starting 05/07/2020    OT Start Time 58    OT Stop Time 1145    OT Time Calculation (min) 45 min    Equipment Utilized During Treatment moist heat    Activity Tolerance Patient tolerated treatment well    Behavior During Therapy [facility] for tasks assessed/performed             Past Medical History:  Diagnosis Date   Acute blood loss anemia    Arthritis    Cancer ([place])    skin   Central cord syndrome at C6 level of cervical spinal cord ([place]) 11/29/2017   Hypertension    Protein-calorie malnutrition, severe ([place]) 01/24/2018   S/P insertion of IVC (inferior vena caval) filter 01/24/2018   Tetraparesis ([place])     Past Surgical History:  Procedure Laterality Date   ANTERIOR CERVICAL DECOMP/DISCECTOMY FUSION N/A 11/29/2017   Procedure: Cervical five-six, six-seven Anterior Cervical Decompression Fusion;  Surgeon: Judith Part, MD;  Location: [place];  Service: Neurosurgery;  Laterality: N/A;  Cervical five-six, six-seven Anterior Cervical Decompression Fusion   CATARACT EXTRACTION     IR IVC FILTER PLMT / S&I /IMG GUID/MOD SED  12/08/2017    There were no vitals filed for this visit.   Subjective Assessment - 07/14/20 1058     Subjective  Pt reports she is stayin inside away from the heat this week.    Patient is accompanied by: Family member    Pertinent History Patient s/p fall November 29, 2017 resulting in diagnosis of central cord syndrome at C 6 level.  She has had therapy in multiple  venues but with recent move to [facility], therapy staff recommended she seek outpatient therapy for her needs.    Patient Stated Goals Patient would like to be able to move better in bed, stand and perform self care tasks.    Currently in Pain? No/denies    Pain Onset 1 to 4 weeks ago              Therapeutic Exercise Pt. tolerated bilateral shoulder flexion, extension, abduction, elbow flexion, extension forearm supination AAROM/AROM. PROM bilateral wrist extension, digit MP, PIP, and DIP flexion, and extension in conjunction with moist heat. AROM digit abduction/adduction.  Therapeutic Activities Pt. worked on reaching using the [employer]. Pt. Grasped and moved the shapes through first 3 vertical dowels of varying heights. Pt. worked on reaching with incorporating her core to improve trunk control while reaching up, improved with lumbar support from w/c.                     OT Education - 07/14/20 1106     Education Details BUE ROM with functional reaching    Person(s) Educated Patient;Caregiver(s)    Methods Explanation;Demonstration    Comprehension Verbalized understanding;Need further instruction                 OT Long Term Goals - 06/16/20 1305  OT LONG TERM GOAL #1   Title Patient and caregiver will demonstrate understanding of home exercise program for ROM.    Baseline Pt. continues to have a restorative aide rehab aide assist her wih exercises at [facility]    Time 12    Period Weeks    Status On-going    Target Date 07/30/20      OT LONG TERM GOAL #4   Title Patient will demonstrate improved composite finger flexion to hold deodorant to apply to underarms.    Baseline Pt. continues to present with improving finger flexion of R hand for a partial gross grasp, but unable to grasp and hold items. Very limited digit flexion of L hand. Pt. has active left thumb abduction.    Time 12    Period Weeks    Status On-going    Target Date  07/30/20      OT LONG TERM GOAL #6   Title Patient will increase right UE ROM to comb the back of her hair with modified independence.    Baseline Pt. continues to be able to comb the front, and sides of her head. Pt. can reach the back, however cannot see what she is doing in the back of her head.    Time 12    Period Weeks    Status Partially Met    Target Date 07/30/20      OT LONG TERM GOAL #8   Title Pt. will write, and sign her name with 100% legibility, and modified independence.    Baseline Pt. continues to  consistently filling out her daily menu, and writing for crossword puzzles. Pt. is able to maintain grasp on a wide width pen. [place] to work on Media planner.    Time 12    Period Weeks    Status On-going    Target Date 07/30/20                   Plan - 07/14/20 1107     Clinical Impression Statement Pt. presents with no pain today, and was able to tolerate ROM, moist heat modality, and functional reaching without pain.Pt completed first 2 levels of shape tower with BUE, completed 3rd level with LUE only. Pt noted to compensate with trunk lateral movement and shoulder hiking. Pt. continues to progress with her RUE, and hand when performing self-feeding with adpative equipment. Pt. continues to be able to use her right thumb to hold items. Pt. continues to benefit from working on improving ROM in order to work towards increasing engagement of her bilateral hands during ADLs, and IADL tasks.    OT Occupational Profile and History Detailed Assessment- Review of Records and additional review of physical, cognitive, psychosocial history related to current functional performance    Occupational performance deficits (Please refer to evaluation for details): ADL's;IADL's;Leisure;Social Participation    Body Structure / Function / Physical Skills ADL;Continence;Dexterity;Flexibility;Strength;ROM;Balance;Coordination;FMC;IADL;Endurance;UE functional use;Decreased  knowledge of use of DME;GMC    Psychosocial Skills Environmental  Adaptations;Habits;Routines and Behaviors    Rehab Potential Fair    Clinical Decision Making Limited treatment options, no task modification necessary    Comorbidities Affecting Occupational Performance: Presence of comorbidities impacting occupational performance    Modification or Assistance to Complete Evaluation  No modification of tasks or assist necessary to complete eval    OT Frequency 2x / week    OT Duration 12 weeks    OT Treatment/Interventions Self-care/ADL training;Cryotherapy;Therapeutic exercise;DME and/or AE instruction;Balance training;Neuromuscular education;Manual Therapy;Splinting;Moist Heat;Passive range  of motion;Therapeutic activities;Patient/family education    Consulted and Agree with Plan of Care Patient             Patient will benefit from skilled therapeutic intervention in order to improve the following deficits and impairments:   Body Structure / Function / Physical Skills: ADL, Continence, Dexterity, Flexibility, Strength, ROM, Balance, Coordination, FMC, IADL, Endurance, UE functional use, Decreased knowledge of use of DME, GMC   Psychosocial Skills: Environmental  Adaptations, Habits, Routines and Behaviors   Visit Diagnosis: Muscle weakness (generalized)  Other lack of coordination    Problem List Patient Active Problem List   Diagnosis Date Noted   Acute appendicitis with appendiceal abscess 05/30/2018   Hypocalcemia 02/15/2018   Dysuria 02/15/2018   Bilateral lower extremity edema 02/11/2018   Vaginal yeast infection 01/30/2018   Chest tightness 01/27/2018   Healthcare-associated pneumonia 01/25/2018   Pleural effusion, not elsewhere classified 01/25/2018   Acute deep vein thrombosis (DVT) of left lower extremity ([place]) 01/24/2018   Acute deep vein thrombosis (DVT) of right lower extremity ([place]) 01/24/2018   Chronic allergic rhinitis 01/24/2018   Depression with anxiety  01/24/2018   UTI due to Klebsiella species 01/24/2018   Protein-calorie malnutrition, severe ([place]) 01/24/2018   S/P insertion of IVC (inferior vena caval) filter 01/24/2018   Reactive depression    Hypokalemia    Leukocytosis    Essential hypertension    Trauma    Tetraparesis ([facility])    Neuropathic pain    Neurogenic bowel    Neurogenic bladder    Benign essential HTN    Acute blood loss anemia    Central cord syndrome at C6 level of cervical spinal cord ([place]) 11/29/2017   Central cord syndrome ([place]) 11/29/2017   Dessie Coma, M.S. OTR/L  07/14/20, 12:07 PM  ascom [phone]  [place] MAIN [facility] SERVICES [place], [place], [address] Phone: [phone]   Fax:  [phone]  Name: Sherry [name] MRN: [id number] Date of Birth: [birth year]

## 2020-07-16 ENCOUNTER — Ambulatory Visit: Payer: Medicare HMO | Admitting: Occupational Therapy

## 2020-07-16 ENCOUNTER — Ambulatory Visit: Payer: Medicare HMO

## 2020-07-16 DIAGNOSIS — W57XXXA Bitten or stung by nonvenomous insect and other nonvenomous arthropods, initial encounter: Secondary | ICD-10-CM | POA: Diagnosis not present

## 2020-07-16 DIAGNOSIS — L239 Allergic contact dermatitis, unspecified cause: Secondary | ICD-10-CM

## 2020-07-21 ENCOUNTER — Ambulatory Visit: Payer: Medicare HMO | Admitting: Occupational Therapy

## 2020-07-21 ENCOUNTER — Ambulatory Visit: Payer: Medicare HMO

## 2020-07-21 ENCOUNTER — Encounter: Payer: Self-pay | Admitting: Occupational Therapy

## 2020-07-21 ENCOUNTER — Other Ambulatory Visit: Payer: Self-pay

## 2020-07-21 DIAGNOSIS — M6281 Muscle weakness (generalized): Secondary | ICD-10-CM | POA: Diagnosis not present

## 2020-07-21 DIAGNOSIS — R262 Difficulty in walking, not elsewhere classified: Secondary | ICD-10-CM

## 2020-07-21 DIAGNOSIS — R269 Unspecified abnormalities of gait and mobility: Secondary | ICD-10-CM

## 2020-07-21 DIAGNOSIS — R278 Other lack of coordination: Secondary | ICD-10-CM

## 2020-07-21 NOTE — Therapy (Signed)
[place] MAIN [facility] SERVICES [address] [place], [place], [address] Phone: [phone]   Fax:  [phone]  Occupational Therapy Treatment  Patient Details  Name: Sherry [name] MRN: [id number] Date of Birth: 1934/11/19 No data recorded  Encounter Date: 07/21/2020   OT End of Session - 07/21/20 1154     Visit Number 61    Number of Visits 42    Date for OT Re-Evaluation 07/30/20    Authorization Time Period Progress report period starting 05/07/2020    OT Start Time 45    OT Stop Time 1145    OT Time Calculation (min) 45 min    Activity Tolerance Patient tolerated treatment well    Behavior During Therapy [facility] for tasks assessed/performed             Past Medical History:  Diagnosis Date   Acute blood loss anemia    Arthritis    Cancer ([place])    skin   Central cord syndrome at C6 level of cervical spinal cord ([place]) 11/29/2017   Hypertension    Protein-calorie malnutrition, severe ([place]) 01/24/2018   S/P insertion of IVC (inferior vena caval) filter 01/24/2018   Tetraparesis ([place])     Past Surgical History:  Procedure Laterality Date   ANTERIOR CERVICAL DECOMP/DISCECTOMY FUSION N/A 11/29/2017   Procedure: Cervical five-six, six-seven Anterior Cervical Decompression Fusion;  Surgeon: Judith Part, MD;  Location: [place];  Service: Neurosurgery;  Laterality: N/A;  Cervical five-six, six-seven Anterior Cervical Decompression Fusion   CATARACT EXTRACTION     IR IVC FILTER PLMT / S&I /IMG GUID/MOD SED  12/08/2017    There were no vitals filed for this visit.   Subjective Assessment - 07/21/20 1153     Subjective  Pt. missed the last session due to having been inundated with aunts in her room/bed the night before.    Patient is accompanied by: Family member    Pertinent History Patient s/p fall November 29, 2017 resulting in diagnosis of central cord syndrome at C 6 level.  She has had therapy in multiple venues but with  recent move to [place], therapy staff recommended she seek outpatient therapy for her needs.    Patient Stated Goals Patient would like to be able to move better in bed, stand and perform self care tasks.    Currently in Pain? No/denies              OT Treatment   Pt. tolerated bilateral shoulder flexion, extension, abduction, elbow flexion, extension forearm supination AAROM/AROM. PROM bilateral wrist extension, digit MP, PIP, and DIP flexion, and extension in conjunction with moist heat. Pt. worked on bilateral UE reaching with the right and Left UE using the horizontal [employer]. Pt. worked on reaching with incorporating her core to improve trunk control while reaching up, and sliding them to the right through 3 horizontal rungs to the right, and 3 rungs to the left x's 1rep each, and through 2 horizontal rungs with each the right, and left UEs.   Pt. reports that ant situation in her room has improved, and she has splet well the past 2 nights. Pt. presents with no pain today, and was able to tolerate ROM, moist heat modality, and functional reaching without pain. Pt. was able to move the [place] rings through 3 horizontal rungs with both the right, and left UEs today x's 2 reps each UE. Pt. continues to progress with her RUE, and hand when performing  self-feeding with adpative equipment. Pt. continues to be able to use her right thumb to hold items. Pt. continues to benefit from working on improving ROM in order to work towards increasing engagement of her bilateral hands during ADLs, and IADL tasks.                           OT Education - 07/21/20 1154     Education Details BUE ROM with functional reaching    Person(s) Educated Patient;Caregiver(s)    Methods Explanation;Demonstration    Comprehension Verbalized understanding;Need further instruction                 OT Long Term Goals - 06/16/20 1305       OT LONG TERM GOAL #1   Title Patient and  caregiver will demonstrate understanding of home exercise program for ROM.    Baseline Pt. continues to have a restorative aide rehab aide assist her wih exercises at [facility]    Time 12    Period Weeks    Status On-going    Target Date 07/30/20      OT LONG TERM GOAL #4   Title Patient will demonstrate improved composite finger flexion to hold deodorant to apply to underarms.    Baseline Pt. continues to present with improving finger flexion of R hand for a partial gross grasp, but unable to grasp and hold items. Very limited digit flexion of L hand. Pt. has active left thumb abduction.    Time 12    Period Weeks    Status On-going    Target Date 07/30/20      OT LONG TERM GOAL #6   Title Patient will increase right UE ROM to comb the back of her hair with modified independence.    Baseline Pt. continues to be able to comb the front, and sides of her head. Pt. can reach the back, however cannot see what she is doing in the back of her head.    Time 12    Period Weeks    Status Partially Met    Target Date 07/30/20      OT LONG TERM GOAL #8   Title Pt. will write, and sign her name with 100% legibility, and modified independence.    Baseline Pt. continues to  consistently filling out her daily menu, and writing for crossword puzzles. Pt. is able to maintain grasp on a wide width pen. [place] to work on Media planner.    Time 12    Period Weeks    Status On-going    Target Date 07/30/20                   Plan - 07/21/20 1154     Clinical Impression Statement Pt. reports that ant situation in her room has improved, and she has splet well the past 2 nights. Pt. presents with no pain today, and was able to tolerate ROM, moist heat modality, and functional reaching without pain. Pt. was able to move the [employer] rings through 3 horizontal rungs with both the right, and left UEs today x's 2 reps each UE. Pt. continues to progress with her RUE, and hand when performing  self-feeding with adpative equipment. Pt. continues to be able to use her right thumb to hold items. Pt. continues to benefit from working on improving ROM in order to work towards increasing engagement of her bilateral hands during ADLs, and IADL tasks.  OT Occupational Profile and History Detailed Assessment- Review of Records and additional review of physical, cognitive, psychosocial history related to current functional performance    Occupational performance deficits (Please refer to evaluation for details): ADL's;IADL's;Leisure;Social Participation    Body Structure / Function / Physical Skills ADL;Continence;Dexterity;Flexibility;Strength;ROM;Balance;Coordination;FMC;IADL;Endurance;UE functional use;Decreased knowledge of use of DME;GMC    Psychosocial Skills Environmental  Adaptations;Habits;Routines and Behaviors    Rehab Potential Fair    Clinical Decision Making Limited treatment options, no task modification necessary    Comorbidities Affecting Occupational Performance: Presence of comorbidities impacting occupational performance    Modification or Assistance to Complete Evaluation  No modification of tasks or assist necessary to complete eval    OT Frequency 2x / week    OT Duration 12 weeks    OT Treatment/Interventions Self-care/ADL training;Cryotherapy;Therapeutic exercise;DME and/or AE instruction;Balance training;Neuromuscular education;Manual Therapy;Splinting;Moist Heat;Passive range of motion;Therapeutic activities;Patient/family education    Consulted and Agree with Plan of Care Patient             Patient will benefit from skilled therapeutic intervention in order to improve the following deficits and impairments:   Body Structure / Function / Physical Skills: ADL, Continence, Dexterity, Flexibility, Strength, ROM, Balance, Coordination, FMC, IADL, Endurance, UE functional use, Decreased knowledge of use of DME, GMC   Psychosocial Skills: Environmental  Adaptations,  Habits, Routines and Behaviors   Visit Diagnosis: Muscle weakness (generalized)  Other lack of coordination    Problem List Patient Active Problem List   Diagnosis Date Noted   Acute appendicitis with appendiceal abscess 05/30/2018   Hypocalcemia 02/15/2018   Dysuria 02/15/2018   Bilateral lower extremity edema 02/11/2018   Vaginal yeast infection 01/30/2018   Chest tightness 01/27/2018   Healthcare-associated pneumonia 01/25/2018   Pleural effusion, not elsewhere classified 01/25/2018   Acute deep vein thrombosis (DVT) of left lower extremity ([place]) 01/24/2018   Acute deep vein thrombosis (DVT) of right lower extremity ([place]) 01/24/2018   Chronic allergic rhinitis 01/24/2018   Depression with anxiety 01/24/2018   UTI due to Klebsiella species 01/24/2018   Protein-calorie malnutrition, severe ([place]) 01/24/2018   S/P insertion of IVC (inferior vena caval) filter 01/24/2018   Reactive depression    Hypokalemia    Leukocytosis    Essential hypertension    Trauma    Tetraparesis (H[facility])    Neuropathic pain    Neurogenic bowel    Neurogenic bladder    Benign essential HTN    Acute blood loss anemia    Central cord syndrome at C6 level of cervical spinal cord ([place]) 11/29/2017   Central cord syndrome ([place]) 11/29/2017    Harrel Carina, MS, OTR/L 07/21/2020, 12:01 PM  [place] MAIN [facility] SERVICES [address] [place], [place], [address] Phone: [phone]   Fax:  [phone]  Name: MICHIKO LINEMAN MRN: [id number] Date of Birth: [birth year]

## 2020-07-22 NOTE — Therapy (Signed)
[place] MAIN [facility] SERVICES [address] [place], [place], [address] Phone: [phone]   Fax:  [phone]  Physical Therapy Treatment  Patient Details  Name: Sherry [name] MRN: [id number] Date of Birth: [birth year] Referring Provider (PT): Viviana Simpler   Encounter Date: 07/21/2020   PT End of Session - 07/21/20 1120     Visit Number 72    Number of Visits 104    Date for PT Re-Evaluation 09/24/20    Authorization Type [employer]are FOTO performed by PT on eval (7/20), score 12, Progress note on 03/10/2020    Authorization Time Period Recert 0/08/6224- 3/33/5456; PN on 07/07/2020    PT Start Time 1145    PT Stop Time 1228    PT Time Calculation (min) 43 min    Equipment Utilized During Treatment Gait belt    Activity Tolerance Patient tolerated treatment well;Patient limited by fatigue    Behavior During Therapy WFL for tasks assessed/performed             Past Medical History:  Diagnosis Date   Acute blood loss anemia    Arthritis    Cancer ([place])    skin   Central cord syndrome at C6 level of cervical spinal cord ([place]) 11/29/2017   Hypertension    Protein-calorie malnutrition, severe ([place]) 01/24/2018   S/P insertion of IVC (inferior vena caval) filter 01/24/2018   Tetraparesis ([place])     Past Surgical History:  Procedure Laterality Date   ANTERIOR CERVICAL DECOMP/DISCECTOMY FUSION N/A 11/29/2017   Procedure: Cervical five-six, six-seven Anterior Cervical Decompression Fusion;  Surgeon: Judith Part, MD;  Location: [place];  Service: Neurosurgery;  Laterality: N/A;  Cervical five-six, six-seven Anterior Cervical Decompression Fusion   CATARACT EXTRACTION     IR IVC FILTER PLMT / S&I /IMG GUID/MOD SED  12/08/2017    There were no vitals filed for this visit.   Subjective Assessment - 07/21/20 1119     Subjective Patient reports feeling okay today- no pain or new issues. Agreeable to try to stand with platform  walker.    Pertinent History Pt is an 86 yo female that fell in 2019, fractured vertebrae in her neck and in her low back per family. 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 After hospital stay, pt discharged to [facility] for ~1 month, experienced severe UTI as well as bilateral DVT (IVC filter placed). Discharged to [facility], stayed for about a year, and then moved to [facility] in April 2021. Was receiving PT, but per family facility reported that did not have adequate equipment to maximize PT for her. Pt until about 1 month ago was practicing sit to stand transfers with 1-2 people, and working on static standing. Has a brace for L foot due to PF. Pt currently needs assistance with all ADLs (able to assist with donning/doffing her shirt), bed baths, and needs a hoyer lift for transfers. Able to drive power wheelchair without assistance.    Limitations Standing;Walking;House hold activities;Lifting    How long can you sit comfortably? NA    How long can you stand comfortably? unable    How long can you walk comfortably? unable    Patient Stated Goals "to stand up and walk"    Currently in Pain? No/denies            Interventions:   Stand pivot transfer: Patient unable to move feet- requiring assist to flex knees and position feet. She is able to  pull herself forward and reach for back of PT's arm and able to stand with max assist- Unable to turn pivot right foot today. Max assist with pivot transfer from power chair to mat table and later from mat table back to power chair. Patient was able to stand fairly erect for safe transfer but still unable to negotiate a step or pivot without max A.   Stand using platform attachments:  - Patient required max assist to static stand using front wheeled walker with dual platform attachment. Patient able to stand for 5 trials today from 30 sec to 1 min 45 sec with initial difficulty extending knees and hips. She required physical assist to  position hips over feet and decreased ability to place much weight through right LE. She did attempt dynamic lateral weight shifting but struggled to accept weight through right LE today. She was able extend hips with VC/TC and some physical assist.   Clinical Impression: Patient is becoming more used to platform walker and able to stand better with improving quality including more erect posture with VC/TC/ and physical assistance. She is still unable to pivot or accept much weight through right LE which is limiting her ability to progress her stand pivot transfer or pre gait activities. Patient will benefit from continued PT services to increase mobility and strength to improve patient's quality of life.                           PT Education - 07/22/20 1120     Education Details postural cues with standing.    Person(s) Educated Patient    Methods Explanation;Demonstration;Tactile cues;Verbal cues    Comprehension Verbalized understanding;Returned demonstration;Verbal cues required;Need further instruction;Tactile cues required              PT Short Term Goals - 03/10/20 2211       PT SHORT TERM GOAL #1   Title The patient will perform initial HEP with minimum assistance in order to improve strength and function.    Baseline Patient demonstrating independence with initial HEP as of 03/10/2020 with no questions or difficulty.    Time 4    Period Weeks    Status Achieved    Target Date 02/11/20               PT Long Term Goals - 07/02/20 1734       PT LONG TERM GOAL #1   Title The patient will be compliant with finalized HEP with minimum assistance in preparation for self management and maintenance of condition.    Baseline 03/10/2020- Patient familiar with initial HEP and will keep goal active as HEP becomes more progressive including possible transfers and standing activities. 04/07/2020- Patient continues to report compliance with LE strengthening  home exercises without questions or concerns. 04/21/2020- Patient able to verbalize and demonstrate good understanding of current HEP including some supine and seated LE strengthening exercises. Reviewed and patient performed 10 reps today with minimal cueing. Will keep goal active to progress HEP as appropriate. 05/28/2020- patient reports continues to perform LE strengthening as able and some help from caregiver as able. No changes at this time to home program as patient is limited to supine/seated therex. 07/02/2020- Patient continues to report compliance with current supine and seated LE strengtheing home program and states no questions or concerns regarding her plan.    Time 12    Period Weeks    Status Achieved  PT LONG TERM GOAL #2   Title The patient will demonstrate at least 10 point improvement on FOTO score indicating an improved ability to perform functional activities.    Baseline on eval (/720) score 12; 10/22/19: 12; 12/10/19: 18, 12/13: 18: 04/07/2020- Will assess next visit. 07/02/2020- Will obtain next visit.    Time 12    Period Weeks    Status On-going    Target Date 09/24/20      PT LONG TERM GOAL #3   Title The patient will demonstrate lateral scooting  for assistance with transfers with minA to maximize independence.    Baseline 10/22/19: Pt requires modA+1 for lateral scooting; 12/10/19: modA+1, 12/13: modA-maxA, 12/27: modA-maxA, 03/10/2020- ModA-MaxA- increased verbal cues and visual demonstration. 04/07/2020- Continued Max assist to perform forward/lateral scoot. 04/21/2020- Patient was able to demo slight lateral scoot with CGA approx 12 in then fatigued requiring max assist- she is able to scoot forward by leaning without significant issues. 05/28/2020- While sitting in chair- patient able to perform forward and lateral scoot with VC/visual demo and increased time allowed- CGA today but not consistent yet- will keep goal active at this time to continue to focus on strengthening and  improving technique. 07/02/2020- Patient able to demonstrate minimal ability to laterally shift weigh on mat table requiring max assist yet is able to forward scoot out to edge of mat table with min Assist.    Time 12    Period Weeks    Status On-going    Target Date 09/24/20      PT LONG TERM GOAL #4   Title The patient will demonstrate a squat pivot transfer with minimum assistance to maximize independence and mobility. 07/02/2020- Patient is able to perform a stand pivot transfer with max assist- was able to minimally pivot on left LE today with transfer.    Baseline 10/22/19: unable to safely attempt at this time; 12/10/19: unable to safely attempt at this time, 01/14/20: unable to safety attempt at this time. 03/10/2020- Patient able to perform Stand pivot transfer with Maximal assist. 04/07/2020- Patient continues to require Max assist with sit to stand- will assist SPT next visit. 04/21/2020- Patient presents with Max assist to perform Sit to stand Transfer- unable to pivot or move Left LE well without difficulty.    Time 12    Period Weeks    Status On-going    Target Date 09/24/20      PT LONG TERM GOAL #5   Title The patient will demonstrate sitting without UE support for 2-5 minutes at EOB to improve participation and maximize independence with ADLs.    Baseline 10/22/19: Pt able to sit unsupported for at least 2 minutes at edge of bed. 04/21/2020- Patient continues to demo good sitting at edge of mat > 5 miin without difficulty or back support.    Time 12    Period Weeks    Status Achieved      PT LONG TERM GOAL #6   Title Patient will demonstrate improved functional LE strength as seen by consistent ability to stand > 2 min (3 out of 4 trials) with Mod/Max A for improved LE strength with transfers.    Baseline 05/28/2020- Patient able to inconsistent stand 1-2 min right now in //bars with Mod/Max A. 07/02/2020- Patient was able to progress last 2 visit to standing using bilateral platform  attachment between 1-2 min which is a progression from the parallel bars.    Time 12    Period Weeks  Status On-going    Target Date 09/24/20                   Plan - 07/21/20 1121     Clinical Impression Statement Patient is becoming more used to platform walker and able to stand better with improving quality including more erect posture with VC/TC/ and physical assistance. She is still unable to pivot or accept much weight through right LE which is limiting her ability to progress her stand pivot transfer or pre gait activities. Patient will benefit from continued PT services to increase mobility and strength to improve patient's quality of life.    Personal Factors and Comorbidities Age;Time since onset of injury/illness/exacerbation;Comorbidity 3+;Fitness    Comorbidities HTN, quadriparesis, history of DVT, neurogenic bladder    Examination-Activity Limitations Bathing;Hygiene/Grooming;Squat;Bed Mobility;Lift;Bend;Stand;Engineer, manufacturing;Toileting;Self Feeding;Transfers;Continence;Sit;Dressing;Sleep;Carry    Examination-Participation Restrictions Church;Laundry;Cleaning;Medication Management;Community Activity;Meal Prep;Interpersonal Relationship    Stability/Clinical Decision Making Evolving/Moderate complexity    Rehab Potential Fair    PT Frequency 2x / week    PT Duration 12 weeks    PT Treatment/Interventions ADLs/Self Care Home Management;Electrical Stimulation;Therapeutic activities;Wheelchair mobility training;Vasopneumatic Device;Joint Manipulations;Vestibular;Passive range of motion;Patient/family education;Therapeutic exercise;DME Instruction;Biofeedback;Aquatic Therapy;Moist Heat;Gait training;Balance training;Orthotic Fit/Training;Dry needling;Energy conservation;Taping;Splinting;Neuromuscular re-education;Cryotherapy;Ultrasound;Functional mobility training    PT Next Visit Plan Continue with progressive Standing/transfer training.    PT Home Exercise  Plan no changes    Consulted and Agree with Plan of Care Patient             Patient will benefit from skilled therapeutic intervention in order to improve the following deficits and impairments:  Abnormal gait, Decreased balance, Decreased endurance, Decreased mobility, Difficulty walking, Hypomobility, Impaired sensation, Decreased range of motion, Improper body mechanics, Impaired perceived functional ability, Decreased activity tolerance, Decreased knowledge of use of DME, Decreased safety awareness, Decreased strength, Impaired flexibility, Impaired UE functional use, Postural dysfunction  Visit Diagnosis: Abnormality of gait and mobility  Difficulty in walking, not elsewhere classified  Muscle weakness (generalized)  Other lack of coordination     Problem List Patient Active Problem List   Diagnosis Date Noted   Acute appendicitis with appendiceal abscess 05/30/2018   Hypocalcemia 02/15/2018   Dysuria 02/15/2018   Bilateral lower extremity edema 02/11/2018   Vaginal yeast infection 01/30/2018   Chest tightness 01/27/2018   Healthcare-associated pneumonia 01/25/2018   Pleural effusion, not elsewhere classified 01/25/2018   Acute deep vein thrombosis (DVT) of left lower extremity ([place]) 01/24/2018   Acute deep vein thrombosis (DVT) of right lower extremity ([place]) 01/24/2018   Chronic allergic rhinitis 01/24/2018   Depression with anxiety 01/24/2018   UTI due to Klebsiella species 01/24/2018   Protein-calorie malnutrition, severe ([place]) 01/24/2018   S/P insertion of IVC (inferior vena caval) filter 01/24/2018   Reactive depression    Hypokalemia    Leukocytosis    Essential hypertension    Trauma    Tetraparesis (HCC)    Neuropathic pain    Neurogenic bowel    Neurogenic bladder    Benign essential HTN    Acute blood loss anemia    Central cord syndrome at C6 level of cervical spinal cord ([place]) 11/29/2017   Central cord syndrome ([place]) 11/29/2017    Lewis Moccasin, PT 07/22/2020, 11:30 AM  [place] MAIN [facility] SERVICES [address] [place], [place], [address] Phone: [phone]   Fax:  [phone]  Name: Sherry [name] MRN: [id number] Date of Birth: 01-12-1935

## 2020-07-23 ENCOUNTER — Ambulatory Visit: Payer: Medicare HMO | Admitting: Occupational Therapy

## 2020-07-23 ENCOUNTER — Ambulatory Visit: Payer: Medicare HMO

## 2020-07-23 ENCOUNTER — Encounter: Payer: Self-pay | Admitting: Occupational Therapy

## 2020-07-23 ENCOUNTER — Other Ambulatory Visit: Payer: Self-pay

## 2020-07-23 DIAGNOSIS — M6281 Muscle weakness (generalized): Secondary | ICD-10-CM

## 2020-07-23 DIAGNOSIS — R262 Difficulty in walking, not elsewhere classified: Secondary | ICD-10-CM

## 2020-07-23 DIAGNOSIS — R278 Other lack of coordination: Secondary | ICD-10-CM

## 2020-07-23 DIAGNOSIS — R269 Unspecified abnormalities of gait and mobility: Secondary | ICD-10-CM

## 2020-07-23 NOTE — Therapy (Signed)
[place] MAIN [facility] SERVICES [address] [place], [place], [address] Phone: [phone]   Fax:  [phone]  Physical Therapy Treatment  Patient Details  Name: Sherry [name] MRN: [id number] Date of Birth: [birth year] Referring Provider (PT): Viviana Simpler   Encounter Date: 07/23/2020   PT End of Session - 07/23/20 1422     Visit Number 44    Number of Visits 104    Date for PT Re-Evaluation 09/24/20    Authorization Type [employer]na medicare FOTO performed by PT on eval (7/20), score 12, Progress note on 03/10/2020    Authorization Time Period Recert 05/02/2876- 6/76/7209; PN on 07/07/2020    PT Start Time 1145    PT Stop Time 1227    PT Time Calculation (min) 42 min    Equipment Utilized During Treatment Gait belt    Activity Tolerance Patient tolerated treatment well;Patient limited by fatigue    Behavior During Therapy WFL for tasks assessed/performed             Past Medical History:  Diagnosis Date   Acute blood loss anemia    Arthritis    Cancer ([place])    skin   Central cord syndrome at C6 level of cervical spinal cord ([place]) 11/29/2017   Hypertension    Protein-calorie malnutrition, severe ([place]) 01/24/2018   S/P insertion of IVC (inferior vena caval) filter 01/24/2018   Tetraparesis ([place])     Past Surgical History:  Procedure Laterality Date   ANTERIOR CERVICAL DECOMP/DISCECTOMY FUSION N/A 11/29/2017   Procedure: Cervical five-six, six-seven Anterior Cervical Decompression Fusion;  Surgeon: Judith Part, MD;  Location: [place];  Service: Neurosurgery;  Laterality: N/A;  Cervical five-six, six-seven Anterior Cervical Decompression Fusion   CATARACT EXTRACTION     IR IVC FILTER PLMT / S&I /IMG GUID/MOD SED  12/08/2017    There were no vitals filed for this visit.   Subjective Assessment - 07/23/20 1147     Subjective Patient reports feeling pretty good today and "ready to put in the work."    Pertinent History Pt is  an 86 yo female that fell in 2019, fractured vertebrae in her neck and in her low back per family. 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 After hospital stay, pt discharged to [facility] for ~1 month, experienced severe UTI as well as bilateral DVT (IVC filter placed). Discharged to [facility], stayed for about a year, and then moved to [place] in April 2021. Was receiving PT, but per family facility reported that did not have adequate equipment to maximize PT for her. Pt until about 1 month ago was practicing sit to stand transfers with 1-2 people, and working on static standing. Has a brace for L foot due to PF. Pt currently needs assistance with all ADLs (able to assist with donning/doffing her shirt), bed baths, and needs a hoyer lift for transfers. Able to drive power wheelchair without assistance.    Limitations Standing;Walking;House hold activities;Lifting    How long can you sit comfortably? NA    How long can you stand comfortably? unable    How long can you walk comfortably? unable    Patient Stated Goals "to stand up and walk"    Currently in Pain? No/denies                   Interventions:   Stand pivot transfer: Max A with verbal cues to lean forward - patient able to sit up straight  on her own and required physical assist to position feet. She was still unable to turn her feet without max assist with pivot transfer today.    Stand using platform attachments:  - Patient required max assist to static stand using front wheeled walker with bilateral platform attachment. Patient able to stand for 3 trials today:  1)  2 min 53 min 2) 5 min 30 sec  3)  3 min 40 sec Patient was able to extend hips with verbal and tactile cues today and. She was able to position her legs and weight over her feet better today and extend her knees much better today.     Clinical Impression: Patient experienced her best session to date in terms of standing quality and time. She  was able to extend hips well overall today and even able to laterally weight shift and move right foot initially then left slightly. Patient will benefit from continued PT services to increase mobility and strength to improve patient's quality of life.                               PT Education - 07/23/20 1148     Education Details postural cues with standing    Person(s) Educated Patient    Methods Explanation;Demonstration;Tactile cues;Verbal cues    Comprehension Verbalized understanding;Returned demonstration;Verbal cues required;Need further instruction              PT Short Term Goals - 03/10/20 2211       PT SHORT TERM GOAL #1   Title The patient will perform initial HEP with minimum assistance in order to improve strength and function.    Baseline Patient demonstrating independence with initial HEP as of 03/10/2020 with no questions or difficulty.    Time 4    Period Weeks    Status Achieved    Target Date 02/11/20               PT Long Term Goals - 07/02/20 1734       PT LONG TERM GOAL #1   Title The patient will be compliant with finalized HEP with minimum assistance in preparation for self management and maintenance of condition.    Baseline 03/10/2020- Patient familiar with initial HEP and will keep goal active as HEP becomes more progressive including possible transfers and standing activities. 04/07/2020- Patient continues to report compliance with LE strengthening home exercises without questions or concerns. 04/21/2020- Patient able to verbalize and demonstrate good understanding of current HEP including some supine and seated LE strengthening exercises. Reviewed and patient performed 10 reps today with minimal cueing. Will keep goal active to progress HEP as appropriate. 05/28/2020- patient reports continues to perform LE strengthening as able and some help from caregiver as able. No changes at this time to home program as patient is  limited to supine/seated therex. 07/02/2020- Patient continues to report compliance with current supine and seated LE strengtheing home program and states no questions or concerns regarding her plan.    Time 12    Period Weeks    Status Achieved      PT LONG TERM GOAL #2   Title The patient will demonstrate at least 10 point improvement on FOTO score indicating an improved ability to perform functional activities.    Baseline on eval (/720) score 12; 10/22/19: 12; 12/10/19: 18, 12/13: 18: 04/07/2020- Will assess next visit. 07/02/2020- Will obtain next visit.    Time  12    Period Weeks    Status On-going    Target Date 09/24/20      PT LONG TERM GOAL #3   Title The patient will demonstrate lateral scooting  for assistance with transfers with minA to maximize independence.    Baseline 10/22/19: Pt requires modA+1 for lateral scooting; 12/10/19: modA+1, 12/13: modA-maxA, 12/27: modA-maxA, 03/10/2020- ModA-MaxA- increased verbal cues and visual demonstration. 04/07/2020- Continued Max assist to perform forward/lateral scoot. 04/21/2020- Patient was able to demo slight lateral scoot with CGA approx 12 in then fatigued requiring max assist- she is able to scoot forward by leaning without significant issues. 05/28/2020- While sitting in chair- patient able to perform forward and lateral scoot with VC/visual demo and increased time allowed- CGA today but not consistent yet- will keep goal active at this time to continue to focus on strengthening and improving technique. 07/02/2020- Patient able to demonstrate minimal ability to laterally shift weigh on mat table requiring max assist yet is able to forward scoot out to edge of mat table with min Assist.    Time 12    Period Weeks    Status On-going    Target Date 09/24/20      PT LONG TERM GOAL #4   Title The patient will demonstrate a squat pivot transfer with minimum assistance to maximize independence and mobility. 07/02/2020- Patient is able to perform a stand pivot  transfer with max assist- was able to minimally pivot on left LE today with transfer.    Baseline 10/22/19: unable to safely attempt at this time; 12/10/19: unable to safely attempt at this time, 01/14/20: unable to safety attempt at this time. 03/10/2020- Patient able to perform Stand pivot transfer with Maximal assist. 04/07/2020- Patient continues to require Max assist with sit to stand- will assist SPT next visit. 04/21/2020- Patient presents with Max assist to perform Sit to stand Transfer- unable to pivot or move Left LE well without difficulty.    Time 12    Period Weeks    Status On-going    Target Date 09/24/20      PT LONG TERM GOAL #5   Title The patient will demonstrate sitting without UE support for 2-5 minutes at EOB to improve participation and maximize independence with ADLs.    Baseline 10/22/19: Pt able to sit unsupported for at least 2 minutes at edge of bed. 04/21/2020- Patient continues to demo good sitting at edge of mat > 5 miin without difficulty or back support.    Time 12    Period Weeks    Status Achieved      PT LONG TERM GOAL #6   Title Patient will demonstrate improved functional LE strength as seen by consistent ability to stand > 2 min (3 out of 4 trials) with Mod/Max A for improved LE strength with transfers.    Baseline 05/28/2020- Patient able to inconsistent stand 1-2 min right now in //bars with Mod/Max A. 07/02/2020- Patient was able to progress last 2 visit to standing using bilateral platform attachment between 1-2 min which is a progression from the parallel bars.    Time 12    Period Weeks    Status On-going    Target Date 09/24/20                   Plan - 07/23/20 1424     Clinical Impression Statement Patient experienced her best session to date in terms of standing quality and time. She was able to  extend hips well overall today and even able to laterally weight shift and move right foot initially then left slightly. Patient will benefit from  continued PT services to increase mobility and strength to improve patient's quality of life.    Personal Factors and Comorbidities Age;Time since onset of injury/illness/exacerbation;Comorbidity 3+;Fitness    Comorbidities HTN, quadriparesis, history of DVT, neurogenic bladder    Examination-Activity Limitations Bathing;Hygiene/Grooming;Squat;Bed Mobility;Lift;Bend;Stand;Engineer, manufacturing;Toileting;Self Feeding;Transfers;Continence;Sit;Dressing;Sleep;Carry    Examination-Participation Restrictions Church;Laundry;Cleaning;Medication Management;Community Activity;Meal Prep;Interpersonal Relationship    Stability/Clinical Decision Making Evolving/Moderate complexity    Rehab Potential Fair    PT Frequency 2x / week    PT Duration 12 weeks    PT Treatment/Interventions ADLs/Self Care Home Management;Electrical Stimulation;Therapeutic activities;Wheelchair mobility training;Vasopneumatic Device;Joint Manipulations;Vestibular;Passive range of motion;Patient/family education;Therapeutic exercise;DME Instruction;Biofeedback;Aquatic Therapy;Moist Heat;Gait training;Balance training;Orthotic Fit/Training;Dry needling;Energy conservation;Taping;Splinting;Neuromuscular re-education;Cryotherapy;Ultrasound;Functional mobility training    PT Next Visit Plan Continue with progressive Standing/transfer training.    PT Home Exercise Plan no changes    Consulted and Agree with Plan of Care Patient             Patient will benefit from skilled therapeutic intervention in order to improve the following deficits and impairments:  Abnormal gait, Decreased balance, Decreased endurance, Decreased mobility, Difficulty walking, Hypomobility, Impaired sensation, Decreased range of motion, Improper body mechanics, Impaired perceived functional ability, Decreased activity tolerance, Decreased knowledge of use of DME, Decreased safety awareness, Decreased strength, Impaired flexibility, Impaired UE functional  use, Postural dysfunction  Visit Diagnosis: Abnormality of gait and mobility  Difficulty in walking, not elsewhere classified  Muscle weakness (generalized)  Other lack of coordination     Problem List Patient Active Problem List   Diagnosis Date Noted   Acute appendicitis with appendiceal abscess 05/30/2018   Hypocalcemia 02/15/2018   Dysuria 02/15/2018   Bilateral lower extremity edema 02/11/2018   Vaginal yeast infection 01/30/2018   Chest tightness 01/27/2018   Healthcare-associated pneumonia 01/25/2018   Pleural effusion, not elsewhere classified 01/25/2018   Acute deep vein thrombosis (DVT) of left lower extremity ([place]) 01/24/2018   Acute deep vein thrombosis (DVT) of right lower extremity ([place]) 01/24/2018   Chronic allergic rhinitis 01/24/2018   Depression with anxiety 01/24/2018   UTI due to Klebsiella species 01/24/2018   Protein-calorie malnutrition, severe ([place]) 01/24/2018   S/P insertion of IVC (inferior vena caval) filter 01/24/2018   Reactive depression    Hypokalemia    Leukocytosis    Essential hypertension    Trauma    Tetraparesis ([facility])    Neuropathic pain    Neurogenic bowel    Neurogenic bladder    Benign essential HTN    Acute blood loss anemia    Central cord syndrome at C6 level of cervical spinal cord ([place]) 11/29/2017   Central cord syndrome ([place]) 11/29/2017    Lewis Moccasin, PT 07/23/2020, 2:43 PM  [place] MAIN [facility] SERVICES [address] [place], [place], [address] Phone: [phone]   Fax:  [phone]  Name: Sherry [name] MRN: [id number] Date of Birth: December 02, 1934

## 2020-07-23 NOTE — Therapy (Signed)
[place] MAIN [facility] SERVICES [address] [place], [place], [address] Phone: [phone]   Fax:  [phone]  Occupational Therapy Treatment  Patient Details  Name: Sherry [name] MRN: [id number] Date of Birth: 06/29/1934 No data recorded  Encounter Date: 07/23/2020   OT End of Session - 07/23/20 1108     Visit Number 48    Number of Visits 61    Date for OT Re-Evaluation 07/30/20    Authorization Time Period Progress report period starting 05/07/2020    OT Start Time 83    OT Stop Time 1145    OT Time Calculation (min) 45 min    Activity Tolerance Patient tolerated treatment well    Behavior During Therapy [facility] for tasks assessed/performed             Past Medical History:  Diagnosis Date   Acute blood loss anemia    Arthritis    Cancer ([place])    skin   Central cord syndrome at C6 level of cervical spinal cord ([place]) 11/29/2017   Hypertension    Protein-calorie malnutrition, severe ([place]) 01/24/2018   S/P insertion of IVC (inferior vena caval) filter 01/24/2018   Tetraparesis ([place])     Past Surgical History:  Procedure Laterality Date   ANTERIOR CERVICAL DECOMP/DISCECTOMY FUSION N/A 11/29/2017   Procedure: Cervical five-six, six-seven Anterior Cervical Decompression Fusion;  Surgeon: Judith Part, MD;  Location: [place];  Service: Neurosurgery;  Laterality: N/A;  Cervical five-six, six-seven Anterior Cervical Decompression Fusion   CATARACT EXTRACTION     IR IVC FILTER PLMT / S&I /IMG GUID/MOD SED  12/08/2017    There were no vitals filed for this visit.   Subjective Assessment - 07/23/20 1107     Subjective  Pt. reports that she has been slepping better.    Patient is accompanied by: Family member    Pertinent History Patient s/p fall November 29, 2017 resulting in diagnosis of central cord syndrome at C 6 level.  She has had therapy in multiple venues but with recent move to [facility], therapy staff recommended  she seek outpatient therapy for her needs.    Currently in Pain? No/denies            OT Treatment   Pt. tolerated bilateral shoulder flexion, extension, abduction, elbow flexion, extension forearm supination AAROM/AROM. PROM bilateral wrist extension, digit MP, PIP, and DIP flexion, and extension in conjunction with moist heat.    Pt. continues to make progress overall, and is improving with BUE ROM, and functional reaching. Pt. presents with no pain today, and was able to tolerate ROM, moist heat modality, and functional reaching without pain. Pt. reports right shoulder soreness above 90 degrees from using her w/c outside on the walking paths yesterday. Pt. continues to progress with her RUE, and hand when performing self-feeding with adpative equipment. Pt. continues to be able to use her right thumb to hold items. Pt. continues to benefit from working on improving ROM in order to work towards increasing engagement of her bilateral hands during ADLs, and IADL tasks.                              OT Education - 07/23/20 1108     Education Details BUE ROM with functional reaching    Person(s) Educated Patient;Caregiver(s)    Methods Explanation;Demonstration    Comprehension Verbalized understanding;Need further instruction  OT Long Term Goals - 06/16/20 1305       OT LONG TERM GOAL #1   Title Patient and caregiver will demonstrate understanding of home exercise program for ROM.    Baseline Pt. continues to have a restorative aide rehab aide assist her wih exercises at [facility]    Time 12    Period Weeks    Status On-going    Target Date 07/30/20      OT LONG TERM GOAL #4   Title Patient will demonstrate improved composite finger flexion to hold deodorant to apply to underarms.    Baseline Pt. continues to present with improving finger flexion of R hand for a partial gross grasp, but unable to grasp and hold items. Very limited digit  flexion of L hand. Pt. has active left thumb abduction.    Time 12    Period Weeks    Status On-going    Target Date 07/30/20      OT LONG TERM GOAL #6   Title Patient will increase right UE ROM to comb the back of her hair with modified independence.    Baseline Pt. continues to be able to comb the front, and sides of her head. Pt. can reach the back, however cannot see what she is doing in the back of her head.    Time 12    Period Weeks    Status Partially Met    Target Date 07/30/20      OT LONG TERM GOAL #8   Title Pt. will write, and sign her name with 100% legibility, and modified independence.    Baseline Pt. continues to  consistently filling out her daily menu, and writing for crossword puzzles. Pt. is able to maintain grasp on a wide width pen. [place] to work on Media planner.    Time 12    Period Weeks    Status On-going    Target Date 07/30/20                   Plan - 07/23/20 1109     Clinical Impression Statement Pt. continues to make progress overall, and is improving with BUE ROM, and functional reaching. Pt. presents with no pain today, and was able to tolerate ROM, moist heat modality, and functional reaching without pain. Pt. reports right shoulder soreness above 90 degrees from using her w/c outside on the walking paths yesterday. Pt. continues to progress with her RUE, and hand when performing self-feeding with adpative equipment. Pt. continues to be able to use her right thumb to hold items. Pt. continues to benefit from working on improving ROM in order to work towards increasing engagement of her bilateral hands during ADLs, and IADL tasks.   OT Occupational Profile and History Detailed Assessment- Review of Records and additional review of physical, cognitive, psychosocial history related to current functional performance    Occupational performance deficits (Please refer to evaluation for details): ADL's;IADL's;Leisure;Social Participation     Body Structure / Function / Physical Skills ADL;Continence;Dexterity;Flexibility;Strength;ROM;Balance;Coordination;FMC;IADL;Endurance;UE functional use;Decreased knowledge of use of DME;GMC    Psychosocial Skills Environmental  Adaptations;Habits;Routines and Behaviors    Rehab Potential Fair    Clinical Decision Making Limited treatment options, no task modification necessary    Comorbidities Affecting Occupational Performance: Presence of comorbidities impacting occupational performance    Comorbidities impacting occupational performance description: contractures of bilateral hands, dependent transfers,    Modification or Assistance to Complete Evaluation  No modification of tasks or assist necessary to  complete eval    OT Frequency 2x / week    OT Duration 12 weeks    OT Treatment/Interventions Self-care/ADL training;Cryotherapy;Therapeutic exercise;DME and/or AE instruction;Balance training;Neuromuscular education;Manual Therapy;Splinting;Moist Heat;Passive range of motion;Therapeutic activities;Patient/family education    Consulted and Agree with Plan of Care Patient             Patient will benefit from skilled therapeutic intervention in order to improve the following deficits and impairments:   Body Structure / Function / Physical Skills: ADL, Continence, Dexterity, Flexibility, Strength, ROM, Balance, Coordination, FMC, IADL, Endurance, UE functional use, Decreased knowledge of use of DME, GMC   Psychosocial Skills: Environmental  Adaptations, Habits, Routines and Behaviors   Visit Diagnosis: Muscle weakness (generalized)  Other lack of coordination    Problem List Patient Active Problem List   Diagnosis Date Noted   Acute appendicitis with appendiceal abscess 05/30/2018   Hypocalcemia 02/15/2018   Dysuria 02/15/2018   Bilateral lower extremity edema 02/11/2018   Vaginal yeast infection 01/30/2018   Chest tightness 01/27/2018   Healthcare-associated pneumonia  01/25/2018   Pleural effusion, not elsewhere classified 01/25/2018   Acute deep vein thrombosis (DVT) of left lower extremity ([place]) 01/24/2018   Acute deep vein thrombosis (DVT) of right lower extremity ([place]) 01/24/2018   Chronic allergic rhinitis 01/24/2018   Depression with anxiety 01/24/2018   UTI due to Klebsiella species 01/24/2018   Protein-calorie malnutrition, severe ([place]) 01/24/2018   S/P insertion of IVC (inferior vena caval) filter 01/24/2018   Reactive depression    Hypokalemia    Leukocytosis    Essential hypertension    Trauma    Tetraparesis ([facility])    Neuropathic pain    Neurogenic bowel    Neurogenic bladder    Benign essential HTN    Acute blood loss anemia    Central cord syndrome at C6 level of cervical spinal cord ([place]) 11/29/2017   Central cord syndrome ([place]) 11/29/2017    Harrel Carina, MS, OTR/L 07/23/2020, 12:03 PM  [place] MAIN [facility] SERVICES [address] [place], [place], [address] Phone: [phone]   Fax:  [phone]  Name: JAELA YEPEZ MRN: [id number] Date of Birth: August 08, 1934

## 2020-07-28 ENCOUNTER — Ambulatory Visit: Payer: Medicare HMO | Admitting: Occupational Therapy

## 2020-07-28 ENCOUNTER — Encounter: Payer: Self-pay | Admitting: Occupational Therapy

## 2020-07-28 ENCOUNTER — Ambulatory Visit: Payer: Medicare HMO

## 2020-07-28 ENCOUNTER — Other Ambulatory Visit: Payer: Self-pay

## 2020-07-28 DIAGNOSIS — R269 Unspecified abnormalities of gait and mobility: Secondary | ICD-10-CM

## 2020-07-28 DIAGNOSIS — M6281 Muscle weakness (generalized): Secondary | ICD-10-CM

## 2020-07-28 DIAGNOSIS — R278 Other lack of coordination: Secondary | ICD-10-CM

## 2020-07-28 DIAGNOSIS — R262 Difficulty in walking, not elsewhere classified: Secondary | ICD-10-CM

## 2020-07-28 NOTE — Therapy (Signed)
[place] MAIN [facility] SERVICES [address] [place], [place], [address] Phone: [phone]   Fax:  [phone]  Physical Therapy Treatment  Patient Details  Name: Sherry [name] MRN: [id number] Date of Birth: 01/06/1935 Referring Provider (PT): Viviana Simpler   Encounter Date: 07/28/2020   PT End of Session - 07/28/20 1245     Visit Number 74    Number of Visits 104    Date for PT Re-Evaluation 09/24/20    Authorization Type [employer] medicare FOTO performed by PT on eval (7/20), score 12, Progress note on 03/10/2020    Authorization Time Period Recert 05/08/4257- 5/63/8756; PN on 07/07/2020    PT Start Time 1145    PT Stop Time 1225    PT Time Calculation (min) 40 min    Equipment Utilized During Treatment Gait belt    Activity Tolerance Patient tolerated treatment well;Patient limited by fatigue    Behavior During Therapy WFL for tasks assessed/performed             Past Medical History:  Diagnosis Date   Acute blood loss anemia    Arthritis    Cancer ([place])    skin   Central cord syndrome at C6 level of cervical spinal cord ([place]) 11/29/2017   Hypertension    Protein-calorie malnutrition, severe ([place]) 01/24/2018   S/P insertion of IVC (inferior vena caval) filter 01/24/2018   Tetraparesis ([place])     Past Surgical History:  Procedure Laterality Date   ANTERIOR CERVICAL DECOMP/DISCECTOMY FUSION N/A 11/29/2017   Procedure: Cervical five-six, six-seven Anterior Cervical Decompression Fusion;  Surgeon: Judith Part, MD;  Location: [place];  Service: Neurosurgery;  Laterality: N/A;  Cervical five-six, six-seven Anterior Cervical Decompression Fusion   CATARACT EXTRACTION     IR IVC FILTER PLMT / S&I /IMG GUID/MOD SED  12/08/2017    There were no vitals filed for this visit.   Subjective Assessment - 07/28/20 1243     Subjective Patient reports no significant changes since last visit.    Pertinent History Pt is an 86 yo female  that fell in 2019, fractured vertebrae in her neck and in her low back per family. 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 After hospital stay, pt discharged to CIR for ~1 month, experienced severe UTI as well as bilateral DVT (IVC filter placed). Discharged to [facility], stayed for about a year, and then moved to [facility] in April 2021. Was receiving PT, but per family facility reported that did not have adequate equipment to maximize PT for her. Pt until about 1 month ago was practicing sit to stand transfers with 1-2 people, and working on static standing. Has a brace for L foot due to PF. Pt currently needs assistance with all ADLs (able to assist with donning/doffing her shirt), bed baths, and needs a hoyer lift for transfers. Able to drive power wheelchair without assistance.    Limitations Standing;Walking;House hold activities;Lifting    How long can you sit comfortably? NA    How long can you stand comfortably? unable    How long can you walk comfortably? unable    Patient Stated Goals "to stand up and walk"    Currently in Pain? No/denies             Transfer training: Stand pivot transfer x 4 trials- Instruction in feet placement - PT assisted patient with max assist - SPT from power chair to mat table - going to patient right  side initially then back to chair on right. Positioned  a chair on opp side and then patient perform transfer from mat to chair with Max assist - then back to mat - then back to chair- All attempting to pivot on right or Left LE- patient was able to move her right LE but unable to move her left LE   Standing at edge of mat  using bilateral Platform walker- x 3 trials: 2 min 45 sec; 1 min 40 sec; 1 min 10 sec - focusing on extending hips and anterior weight shift. Patient able to stand today with max assist yet once in standing -able to maintain with Moderate assist.    Clinical Impression: Patient able to pivot on right LE better than Left  LE today as her left LE is her power/strength leg and she cannot add enough weight through her right LE for ability to bend left knee or move her foot during transfer. Patient will benefit from continued PT services to increase mobility and strength to improve patient's quality of life.                          PT Education - 07/28/20 1244     Education Details posture technique    Person(s) Educated Patient    Methods Explanation;Demonstration;Tactile cues;Verbal cues    Comprehension Verbalized understanding;Tactile cues required;Need further instruction;Returned demonstration;Verbal cues required              PT Short Term Goals - 03/10/20 2211       PT SHORT TERM GOAL #1   Title The patient will perform initial HEP with minimum assistance in order to improve strength and function.    Baseline Patient demonstrating independence with initial HEP as of 03/10/2020 with no questions or difficulty.    Time 4    Period Weeks    Status Achieved    Target Date 02/11/20               PT Long Term Goals - 07/02/20 1734       PT LONG TERM GOAL #1   Title The patient will be compliant with finalized HEP with minimum assistance in preparation for self management and maintenance of condition.    Baseline 03/10/2020- Patient familiar with initial HEP and will keep goal active as HEP becomes more progressive including possible transfers and standing activities. 04/07/2020- Patient continues to report compliance with LE strengthening home exercises without questions or concerns. 04/21/2020- Patient able to verbalize and demonstrate good understanding of current HEP including some supine and seated LE strengthening exercises. Reviewed and patient performed 10 reps today with minimal cueing. Will keep goal active to progress HEP as appropriate. 05/28/2020- patient reports continues to perform LE strengthening as able and some help from caregiver as able. No changes at this  time to home program as patient is limited to supine/seated therex. 07/02/2020- Patient continues to report compliance with current supine and seated LE strengtheing home program and states no questions or concerns regarding her plan.    Time 12    Period Weeks    Status Achieved      PT LONG TERM GOAL #2   Title The patient will demonstrate at least 10 point improvement on FOTO score indicating an improved ability to perform functional activities.    Baseline on eval (/720) score 12; 10/22/19: 12; 12/10/19: 18, 12/13: 18: 04/07/2020- Will assess next visit. 07/02/2020- Will obtain next visit.  Time 12    Period Weeks    Status On-going    Target Date 09/24/20      PT LONG TERM GOAL #3   Title The patient will demonstrate lateral scooting  for assistance with transfers with minA to maximize independence.    Baseline 10/22/19: Pt requires modA+1 for lateral scooting; 12/10/19: modA+1, 12/13: modA-maxA, 12/27: modA-maxA, 03/10/2020- ModA-MaxA- increased verbal cues and visual demonstration. 04/07/2020- Continued Max assist to perform forward/lateral scoot. 04/21/2020- Patient was able to demo slight lateral scoot with CGA approx 12 in then fatigued requiring max assist- she is able to scoot forward by leaning without significant issues. 05/28/2020- While sitting in chair- patient able to perform forward and lateral scoot with VC/visual demo and increased time allowed- CGA today but not consistent yet- will keep goal active at this time to continue to focus on strengthening and improving technique. 07/02/2020- Patient able to demonstrate minimal ability to laterally shift weigh on mat table requiring max assist yet is able to forward scoot out to edge of mat table with min Assist.    Time 12    Period Weeks    Status On-going    Target Date 09/24/20      PT LONG TERM GOAL #4   Title The patient will demonstrate a squat pivot transfer with minimum assistance to maximize independence and mobility. 07/02/2020-  Patient is able to perform a stand pivot transfer with max assist- was able to minimally pivot on left LE today with transfer.    Baseline 10/22/19: unable to safely attempt at this time; 12/10/19: unable to safely attempt at this time, 01/14/20: unable to safety attempt at this time. 03/10/2020- Patient able to perform Stand pivot transfer with Maximal assist. 04/07/2020- Patient continues to require Max assist with sit to stand- will assist SPT next visit. 04/21/2020- Patient presents with Max assist to perform Sit to stand Transfer- unable to pivot or move Left LE well without difficulty.    Time 12    Period Weeks    Status On-going    Target Date 09/24/20      PT LONG TERM GOAL #5   Title The patient will demonstrate sitting without UE support for 2-5 minutes at EOB to improve participation and maximize independence with ADLs.    Baseline 10/22/19: Pt able to sit unsupported for at least 2 minutes at edge of bed. 04/21/2020- Patient continues to demo good sitting at edge of mat > 5 miin without difficulty or back support.    Time 12    Period Weeks    Status Achieved      PT LONG TERM GOAL #6   Title Patient will demonstrate improved functional LE strength as seen by consistent ability to stand > 2 min (3 out of 4 trials) with Mod/Max A for improved LE strength with transfers.    Baseline 05/28/2020- Patient able to inconsistent stand 1-2 min right now in //bars with Mod/Max A. 07/02/2020- Patient was able to progress last 2 visit to standing using bilateral platform attachment between 1-2 min which is a progression from the parallel bars.    Time 12    Period Weeks    Status On-going    Target Date 09/24/20                   Plan - 07/28/20 1246     Clinical Impression Statement Patient able to pivot on right LE better than Left LE today as her left LE is  her power/strength leg and she cannot add enough weight through her right LE for ability to bend left knee or move her foot during  transfer. Patient will benefit from continued PT services to increase mobility and strength to improve patient's quality of life.    Personal Factors and Comorbidities Age;Time since onset of injury/illness/exacerbation;Comorbidity 3+;Fitness    Comorbidities HTN, quadriparesis, history of DVT, neurogenic bladder    Examination-Activity Limitations Bathing;Hygiene/Grooming;Squat;Bed Mobility;Lift;Bend;Stand;Engineer, manufacturing;Toileting;Self Feeding;Transfers;Continence;Sit;Dressing;Sleep;Carry    Examination-Participation Restrictions Church;Laundry;Cleaning;Medication Management;Community Activity;Meal Prep;Interpersonal Relationship    Stability/Clinical Decision Making Evolving/Moderate complexity    Rehab Potential Fair    PT Frequency 2x / week    PT Duration 12 weeks    PT Treatment/Interventions ADLs/Self Care Home Management;Electrical Stimulation;Therapeutic activities;Wheelchair mobility training;Vasopneumatic Device;Joint Manipulations;Vestibular;Passive range of motion;Patient/family education;Therapeutic exercise;DME Instruction;Biofeedback;Aquatic Therapy;Moist Heat;Gait training;Balance training;Orthotic Fit/Training;Dry needling;Energy conservation;Taping;Splinting;Neuromuscular re-education;Cryotherapy;Ultrasound;Functional mobility training    PT Next Visit Plan Continue with progressive Standing/transfer training.    PT Home Exercise Plan no changes    Consulted and Agree with Plan of Care Patient             Patient will benefit from skilled therapeutic intervention in order to improve the following deficits and impairments:  Abnormal gait, Decreased balance, Decreased endurance, Decreased mobility, Difficulty walking, Hypomobility, Impaired sensation, Decreased range of motion, Improper body mechanics, Impaired perceived functional ability, Decreased activity tolerance, Decreased knowledge of use of DME, Decreased safety awareness, Decreased strength, Impaired  flexibility, Impaired UE functional use, Postural dysfunction  Visit Diagnosis: Abnormality of gait and mobility  Difficulty in walking, not elsewhere classified  Muscle weakness (generalized)  Other lack of coordination     Problem List Patient Active Problem List   Diagnosis Date Noted   Acute appendicitis with appendiceal abscess 05/30/2018   Hypocalcemia 02/15/2018   Dysuria 02/15/2018   Bilateral lower extremity edema 02/11/2018   Vaginal yeast infection 01/30/2018   Chest tightness 01/27/2018   Healthcare-associated pneumonia 01/25/2018   Pleural effusion, not elsewhere classified 01/25/2018   Acute deep vein thrombosis (DVT) of left lower extremity ([place]) 01/24/2018   Acute deep vein thrombosis (DVT) of right lower extremity ([place]) 01/24/2018   Chronic allergic rhinitis 01/24/2018   Depression with anxiety 01/24/2018   UTI due to Klebsiella species 01/24/2018   Protein-calorie malnutrition, severe ([place]) 01/24/2018   S/P insertion of IVC (inferior vena caval) filter 01/24/2018   Reactive depression    Hypokalemia    Leukocytosis    Essential hypertension    Trauma    Tetraparesis ([facility])    Neuropathic pain    Neurogenic bowel    Neurogenic bladder    Benign essential HTN    Acute blood loss anemia    Central cord syndrome at C6 level of cervical spinal cord ([place]) 11/29/2017   Central cord syndrome ([place]) 11/29/2017    Lewis Moccasin, PT 07/28/2020, 2:30 PM  [place] MAIN [facility] SERVICES [address] [place], [place], [address] Phone: [phone]   Fax:  [phone]  Name: AMONDA BRILLHART MRN: [id number] Date of Birth: Mar 15, 1934

## 2020-07-28 NOTE — Therapy (Addendum)
[place] MAIN [facility] SERVICES [address] [place], [place], [address] Phone: [phone]   Fax:  [phone]  Occupational Therapy Treatment  Patient Details  Name: Sherry [name] MRN: [id number] Date of Birth: 26-Feb-1934 No data recorded  Encounter Date: 07/28/2020   OT End of Session - 07/28/20 1112     Visit Number 63    Number of Visits 60    Date for OT Re-Evaluation 07/30/20    Authorization Time Period Progress report period starting 05/07/2020    OT Start Time 80    OT Stop Time 1145    OT Time Calculation (min) 45 min    Equipment Utilized During Treatment moist heat    Activity Tolerance Patient tolerated treatment well    Behavior During Therapy [facility] for tasks assessed/performed             Past Medical History:  Diagnosis Date   Acute blood loss anemia    Arthritis    Cancer ([place])    skin   Central cord syndrome at C6 level of cervical spinal cord ([place]) 11/29/2017   Hypertension    Protein-calorie malnutrition, severe ([place]) 01/24/2018   S/P insertion of IVC (inferior vena caval) filter 01/24/2018   Tetraparesis ([place])     Past Surgical History:  Procedure Laterality Date   ANTERIOR CERVICAL DECOMP/DISCECTOMY FUSION N/A 11/29/2017   Procedure: Cervical five-six, six-seven Anterior Cervical Decompression Fusion;  Surgeon: Judith Part, MD;  Location: [place];  Service: Neurosurgery;  Laterality: N/A;  Cervical five-six, six-seven Anterior Cervical Decompression Fusion   CATARACT EXTRACTION     IR IVC FILTER PLMT / S&I /IMG GUID/MOD SED  12/08/2017    There were no vitals filed for this visit.   Subjective Assessment - 07/28/20 1110     Subjective  Pt. Reports feeling well today, and has been sleeping well.   Patient is accompanied by: Family member    Pertinent History Patient s/p fall November 29, 2017 resulting in diagnosis of central cord syndrome at C 6 level.  She has had therapy in multiple venues  but with recent move to [facility], therapy staff recommended she seek outpatient therapy for her needs.    Patient Stated Goals Patient would like to be able to move better in bed, stand and perform self care tasks.    Currently in Pain? No/denies            OT Treatment   Pt. tolerated bilateral shoulder flexion, extension, abduction, elbow flexion, extension forearm supination AAROM/AROM. PROM bilateral wrist extension, digit MP, PIP, and DIP flexion, and extension in conjunction with moist heat. Pt. Worked on reaching with her BUEs. Pt. Moved [place] rings through 3 horizontal rungs.   Pt. continues to make progress overall, and is improving with BUE ROM, and functional reaching. Pt. presents with no pain today, and was able to tolerate ROM, moist heat modality, and functional reaching without pain.  Pt. continues to progress with her RUE, and hand when performing self-feeding with adpative equipment, utensils, and pens. Pt. Is now able to reach up to perform hair care. Pt. continues to be able to use her right thumb to hold items. Pt. continues to benefit from working on improving ROM in order to work towards increasing engagement of her bilateral hands during ADLs, and IADL tasks.                 OT Education - 07/28/20 1112  Education Details BUE ROM with functional reaching    Person(s) Educated Patient;Caregiver(s)    Methods Explanation;Demonstration    Comprehension Verbalized understanding;Need further instruction                 OT Long Term Goals - 06/16/20 1305       OT LONG TERM GOAL #1   Title Patient and caregiver will demonstrate understanding of home exercise program for ROM.    Baseline Pt. continues to have a restorative aide rehab aide assist her wih exercises at [facility]    Time 12    Period Weeks    Status On-going    Target Date 07/30/20      OT LONG TERM GOAL #4   Title Patient will demonstrate improved composite finger flexion to  hold deodorant to apply to underarms.    Baseline Pt. continues to present with improving finger flexion of R hand for a partial gross grasp, but unable to grasp and hold items. Very limited digit flexion of L hand. Pt. has active left thumb abduction.    Time 12    Period Weeks    Status On-going    Target Date 07/30/20      OT LONG TERM GOAL #6   Title Patient will increase right UE ROM to comb the back of her hair with modified independence.    Baseline Pt. continues to be able to comb the front, and sides of her head. Pt. can reach the back, however cannot see what she is doing in the back of her head.    Time 12    Period Weeks    Status Partially Met    Target Date 07/30/20      OT LONG TERM GOAL #8   Title Pt. will write, and sign her name with 100% legibility, and modified independence.    Baseline Pt. continues to  consistently filling out her daily menu, and writing for crossword puzzles. Pt. is able to maintain grasp on a wide width pen. [place] to work on Media planner.    Time 12    Period Weeks    Status On-going    Target Date 07/30/20                   Plan - 07/28/20 1112     Clinical Impression Statement Pt. continues to make progress overall, and is improving with BUE ROM, and functional reaching. Pt. presents with no pain today, and was able to tolerate ROM, moist heat modality, and functional reaching without pain.  Pt. continues to progress with her RUE, and hand when performing self-feeding with adpative equipment, utensils, and pens. Pt. Is now able to reach up to perform hair care. Pt. continues to be able to use her right thumb to hold items. Pt. continues to benefit from working on improving ROM in order to work towards increasing engagement of her bilateral hands during ADLs, and IADL tasks.     OT Occupational Profile and History Detailed Assessment- Review of Records and additional review of physical, cognitive, psychosocial history related  to current functional performance    Occupational performance deficits (Please refer to evaluation for details): ADL's;IADL's;Leisure;Social Participation    Body Structure / Function / Physical Skills ADL;Continence;Dexterity;Flexibility;Strength;ROM;Balance;Coordination;FMC;IADL;Endurance;UE functional use;Decreased knowledge of use of DME;GMC    Psychosocial Skills Environmental  Adaptations;Habits;Routines and Behaviors    Rehab Potential Fair    Clinical Decision Making Limited treatment options, no task modification necessary    Comorbidities  Affecting Occupational Performance: Presence of comorbidities impacting occupational performance    Comorbidities impacting occupational performance description: contractures of bilateral hands, dependent transfers,    Modification or Assistance to Complete Evaluation  No modification of tasks or assist necessary to complete eval    OT Frequency 2x / week    OT Duration 12 weeks    OT Treatment/Interventions Self-care/ADL training;Cryotherapy;Therapeutic exercise;DME and/or AE instruction;Balance training;Neuromuscular education;Manual Therapy;Splinting;Moist Heat;Passive range of motion;Therapeutic activities;Patient/family education    Consulted and Agree with Plan of Care Patient             Patient will benefit from skilled therapeutic intervention in order to improve the following deficits and impairments:   Body Structure / Function / Physical Skills: ADL, Continence, Dexterity, Flexibility, Strength, ROM, Balance, Coordination, FMC, IADL, Endurance, UE functional use, Decreased knowledge of use of DME, GMC   Psychosocial Skills: Environmental  Adaptations, Habits, Routines and Behaviors   Visit Diagnosis: Muscle weakness (generalized)  Other lack of coordination    Problem List Patient Active Problem List   Diagnosis Date Noted   Acute appendicitis with appendiceal abscess 05/30/2018   Hypocalcemia 02/15/2018   Dysuria  02/15/2018   Bilateral lower extremity edema 02/11/2018   Vaginal yeast infection 01/30/2018   Chest tightness 01/27/2018   Healthcare-associated pneumonia 01/25/2018   Pleural effusion, not elsewhere classified 01/25/2018   Acute deep vein thrombosis (DVT) of left lower extremity ([place]) 01/24/2018   Acute deep vein thrombosis (DVT) of right lower extremity ([place]) 01/24/2018   Chronic allergic rhinitis 01/24/2018   Depression with anxiety 01/24/2018   UTI due to Klebsiella species 01/24/2018   Protein-calorie malnutrition, severe ([place]) 01/24/2018   S/P insertion of IVC (inferior vena caval) filter 01/24/2018   Reactive depression    Hypokalemia    Leukocytosis    Essential hypertension    Trauma    Tetraparesis ([facility])    Neuropathic pain    Neurogenic bowel    Neurogenic bladder    Benign essential HTN    Acute blood loss anemia    Central cord syndrome at C6 level of cervical spinal cord ([place]) 11/29/2017   Central cord syndrome ([place]) 11/29/2017    Harrel Carina, MS, OTR/L 07/28/2020, 12:18 PM  [place] MAIN [facility] SERVICES [address] [place], [place], [address] Phone: [phone]   Fax:  [phone]  Name: Sherry [name] MRN: [id number] Date of Birth: Sep 01, 1934

## 2020-07-30 ENCOUNTER — Other Ambulatory Visit: Payer: Self-pay

## 2020-07-30 ENCOUNTER — Ambulatory Visit: Payer: Medicare HMO | Admitting: Occupational Therapy

## 2020-07-30 ENCOUNTER — Ambulatory Visit: Payer: Medicare HMO

## 2020-07-30 ENCOUNTER — Encounter: Payer: Self-pay | Admitting: Occupational Therapy

## 2020-07-30 DIAGNOSIS — M6281 Muscle weakness (generalized): Secondary | ICD-10-CM

## 2020-07-30 DIAGNOSIS — R262 Difficulty in walking, not elsewhere classified: Secondary | ICD-10-CM

## 2020-07-30 DIAGNOSIS — R269 Unspecified abnormalities of gait and mobility: Secondary | ICD-10-CM

## 2020-07-30 DIAGNOSIS — R278 Other lack of coordination: Secondary | ICD-10-CM

## 2020-07-30 NOTE — Therapy (Signed)
[place] MAIN [facility] SERVICES [address] [place], [place], [address] Phone: [phone]   Fax:  [phone]  Occupational Therapy Treatment/Recertifciation Note Occupational Therapy Progress Note                                                                         Dates of reporting period  05/07/2020   to   07/30/2020   Patient Details  Name: Sherry [name] MRN: [id number] Date of Birth: 04-28-1934 No data recorded  Encounter Date: 07/30/2020   OT End of Session - 07/30/20 1205     Visit Number 50    Number of Visits 19    Date for OT Re-Evaluation 10/22/20    Authorization Time Period Progress report period starting 05/07/2020    OT Start Time 1100    OT Stop Time 1147    OT Time Calculation (min) 47 min    Activity Tolerance Patient tolerated treatment well    Behavior During Therapy [facility] for tasks assessed/performed             Past Medical History:  Diagnosis Date   Acute blood loss anemia    Arthritis    Cancer ([place])    skin   Central cord syndrome at C6 level of cervical spinal cord ([place]) 11/29/2017   Hypertension    Protein-calorie malnutrition, severe ([place]) 01/24/2018   S/P insertion of IVC (inferior vena caval) filter 01/24/2018   Tetraparesis ([place])     Past Surgical History:  Procedure Laterality Date   ANTERIOR CERVICAL DECOMP/DISCECTOMY FUSION N/A 11/29/2017   Procedure: Cervical five-six, six-seven Anterior Cervical Decompression Fusion;  Surgeon: Judith Part, MD;  Location: [place];  Service: Neurosurgery;  Laterality: N/A;  Cervical five-six, six-seven Anterior Cervical Decompression Fusion   CATARACT EXTRACTION     IR IVC FILTER PLMT / S&I /IMG GUID/MOD SED  12/08/2017    There were no vitals filed for this visit.   Subjective Assessment - 07/30/20 1204     Subjective  Pt. reports she was able to spend some time outside this morning.    Patient is accompanied by: Family member    Pertinent  History Patient s/p fall November 29, 2017 resulting in diagnosis of central cord syndrome at C 6 level.  She has had therapy in multiple venues but with recent move to [facility], therapy staff recommended she seek outpatient therapy for her needs.    Patient Stated Goals Patient would like to be able to move better in bed, stand and perform self care tasks.    Currently in Pain? No/denies                [facility] OT Assessment - 07/30/20 1104       AROM   Right Shoulder Flexion 118 Degrees    Right Shoulder ABduction 85 Degrees    Left Shoulder Flexion 125 Degrees    Left Shoulder ABduction 98 Degrees    Right Elbow Flexion 154    Right Elbow Extension 0    Left Elbow Flexion 156    Left Elbow Extension 0    Right Forearm Pronation 70 Degrees    Right Forearm Supination 90 Degrees  Left Forearm Pronation 65 Degrees    Left Forearm Supination 90 Degrees    Right Wrist Extension 40 Degrees    Right Wrist Flexion 54 Degrees    Right Wrist Radial Deviation 11 Degrees    Right Wrist Ulnar Deviation 15 Degrees    Left Wrist Extension -20 Degrees    Left Wrist Flexion 70 Degrees    Left Wrist Radial Deviation 20 Degrees    Left Wrist Ulnar Deviation 15 Degrees      Left Hand PROM   L Index  MCP 0-90 45 Degrees    L Index PIP 0-100 5 Degrees    L Index DIP 0-70 45 Degrees    L Long  MCP 0-90 25 Degrees    L Long PIP 0-100 45 Degrees    L Long DIP 0-70 30 Degrees    L Ring  MCP 0-90 25 Degrees    L Ring PIP 0-100 30 Degrees    L Ring DIP 0-70 60 Degrees    L Little  MCP 0-90 20 Degrees    L Little PIP 0-100 35 Degrees    L Little DIP 0-70 35 Degrees      Hand Function   Right Hand Grip (lbs) 0    Right Hand Lateral Pinch 8 lbs    Right Hand 3 Point Pinch 0 lbs    Left Hand Grip (lbs) 0    Left Hand Lateral Pinch 5 lbs    Left 3 point pinch 0 lbs            OT TREATMENT    Measurements were obtained, and goals were  reviewed with the pt.  Pt. Is making progress  overall, and is now able to  hold utensils and feed herself more frequently without the universal cuff,  however continues to require it at times during self feeding. Pt. is able  to reach further back to use a pic through the back of her hair, however  presents with limited efficiency. Pt. Is Now able to open, and hold  magazines, and newspapers, and is writing more. Pt. Is able to use a  toothbrush to brush her teeth, however requires complete setup of the  toothpaste on the toothbrush. Pt. is able to use her television remote,  and has started texting messages on her cell phone, however reports that  it is not efficient. Pt. Continues to present with limited BUE  ROM, and functional hand use. Pt. Continues to work on these skills to  improve UE hand function, and [facility] skills in order to work towards  improving, and maximizing independence with ADLs, and IADL tasks.                OT Education - 07/30/20 1205     Education Details BUE ROM with functional reaching    Person(s) Educated Patient;Caregiver(s)    Methods Explanation;Demonstration    Comprehension Verbalized understanding;Need further instruction                 OT Long Term Goals - 07/30/20 1139       OT LONG TERM GOAL #1   Title Patient and caregiver will demonstrate understanding of home exercise program for ROM.    Baseline Pt. continues to have a restorative aide rehab aide assist her wih exercises at [facility]    Time 12    Period Weeks    Status On-going    Target Date 10/22/20      OT  LONG TERM GOAL #4   Title Patient will demonstrate improved composite finger flexion to hold deodorant to apply to underarms.    Baseline Pt. continues to present with improving finger flexion of R hand for a partial gross grasp, but unable to grasp and hold items. pt. conitnues to present with limited digit flexion of L hand. Pt. has active left thumb abduction.    Time 12    Period Weeks    Status On-going     Target Date 10/22/20      Long Term Additional Goals   Additional Long Term Goals Yes      OT LONG TERM GOAL #6   Title Patient will increase right UE ROM to comb the back of her hair with modified independence.    Baseline 07/30/2020: Pt. is now able to comb the back of her hair with a pic, however continues to work on completing the task efficiently.    Time 12    Period Weeks    Status Partially Met    Target Date 10/22/20      OT LONG TERM GOAL #7   Title Pt. will independently, and efficiently send text messages on her phone.    Baseline 07/30/2020: Pt. continues to present with limited BUE strength, and [facility] skills.    Time 12    Period Weeks    Status New    Target Date 07/30/20      OT LONG TERM GOAL #8   Title Pt. will write, and sign her name with 100% legibility, and modified independence.    Baseline Pt. continues to  consistently filling out her daily menu, and writing for crossword puzzles. Pt. is able to maintain grasp on a wide width pen. [place] to work on [employer].    Time 12    Period Weeks    Status On-going    Target Date 10/22/20                   Plan - 07/30/20 1206     Clinical Impression Statement Measurements were obtained, and goals were  reviewed with the pt.  Pt. Is making progress overall, and is now able to  hold utensils and feed herself more frequently without the universal cuff,  however continues to require it at times during self feeding. Pt. is able  to reach further back to use a pic through the back of her hair, however  presents with limited efficiency. Pt. Is Now able to open, and hold  magazines, and newspapers, and is writing more. Pt. Is able to use a  toothbrush to brush her teeth, however requires complete setup of the  toothpaste on the toothbrush. Pt. is able to use her television remote,  and has started texting messages on her cell phone, however reports that  it is not efficient. Pt. Continues to  present with limited BUE  ROM, and functional hand use. Pt. Continues to work on these skills to  improve UE hand function, and [facility] skills in order to work towards  improving, and maximizing independence with ADLs, and IADL tasks.      OT Occupational Profile and History Detailed Assessment- Review of Records and additional review of physical, cognitive, psychosocial history related to current functional performance    Occupational performance deficits (Please refer to evaluation for details): ADL's;IADL's;Leisure;Social Participation    Body Structure / Function / Physical Skills ADL;Continence;Dexterity;Flexibility;Strength;ROM;Balance;Coordination;FMC;IADL;Endurance;UE functional use;Decreased knowledge of use of [facility]    Psychosocial Skills Environmental  Adaptations;Habits;Routines and Behaviors    Rehab Potential Fair    Clinical Decision Making Limited treatment options, no task modification necessary    Comorbidities Affecting Occupational Performance: Presence of comorbidities impacting occupational performance    Comorbidities impacting occupational performance description: contractures of bilateral hands, dependent transfers,    Modification or Assistance to Complete Evaluation  No modification of tasks or assist necessary to complete eval    OT Frequency 2x / week    OT Duration 12 weeks    OT Treatment/Interventions Self-care/ADL training;Cryotherapy;Therapeutic exercise;DME and/or AE instruction;Balance training;Neuromuscular education;Manual Therapy;Splinting;Moist Heat;Passive range of motion;Therapeutic activities;Patient/family education    Plan continue to progress ROM of digits, wrists and shoulders as it pertains to completion of ADL tasks that pt values.    Consulted and Agree with Plan of Care Patient             Patient will benefit from skilled therapeutic intervention in order to improve the following deficits and impairments:   Body Structure / Function /  Physical Skills: ADL, Continence, Dexterity, Flexibility, Strength, ROM, Balance, Coordination, FMC, IADL, Endurance, UE functional use, Decreased knowledge of use of DME, GMC   Psychosocial Skills: Environmental  Adaptations, Habits, Routines and Behaviors   Visit Diagnosis: Muscle weakness (generalized)  Other lack of coordination    Problem List Patient Active Problem List   Diagnosis Date Noted   Acute appendicitis with appendiceal abscess 05/30/2018   Hypocalcemia 02/15/2018   Dysuria 02/15/2018   Bilateral lower extremity edema 02/11/2018   Vaginal yeast infection 01/30/2018   Chest tightness 01/27/2018   Healthcare-associated pneumonia 01/25/2018   Pleural effusion, not elsewhere classified 01/25/2018   Acute deep vein thrombosis (DVT) of left lower extremity ([place]) 01/24/2018   Acute deep vein thrombosis (DVT) of right lower extremity ([place]) 01/24/2018   Chronic allergic rhinitis 01/24/2018   Depression with anxiety 01/24/2018   UTI due to Klebsiella species 01/24/2018   Protein-calorie malnutrition, severe ([place]) 01/24/2018   S/P insertion of IVC (inferior vena caval) filter 01/24/2018   Reactive depression    Hypokalemia    Leukocytosis    Essential hypertension    Trauma    Tetraparesis ([facility])    Neuropathic pain    Neurogenic bowel    Neurogenic bladder    Benign essential HTN    Acute blood loss anemia    Central cord syndrome at C6 level of cervical spinal cord ([place]) 11/29/2017   Central cord syndrome ([place]) 11/29/2017    Harrel Carina, MS, OTR/L 07/30/2020, 12:19 PM  [place] MAIN [facility] SERVICES [address] [place], [place], [address] Phone: [phone]   Fax:  [phone]  Name: NAMIYAH GRANTHAM MRN: [id number] Date of Birth: [birth year]

## 2020-07-31 NOTE — Therapy (Signed)
[place] MAIN [facility] SERVICES [address] [place], [place], [address] Phone: [phone]   Fax:  [phone]  Physical Therapy Treatment  Patient Details  Name: Sherry [name] MRN: [id number] Date of Birth: 1934/02/05 Referring Provider (PT): Viviana Simpler   Encounter Date: 07/30/2020   PT End of Session - 07/30/20 1252     Visit Number 75    Number of Visits 104    Date for PT Re-Evaluation 09/24/20    Authorization Type [employer] medicare FOTO performed by PT on eval (7/20), score 12, Progress note on 03/10/2020    Authorization Time Period Recert 0/04/5007- 3/81/8299; PN on 07/07/2020    PT Start Time 1148    PT Stop Time 1225    PT Time Calculation (min) 37 min    Equipment Utilized During Treatment Gait belt    Activity Tolerance Patient tolerated treatment well;Patient limited by fatigue    Behavior During Therapy WFL for tasks assessed/performed             Past Medical History:  Diagnosis Date   Acute blood loss anemia    Arthritis    Cancer ([place])    skin   Central cord syndrome at C6 level of cervical spinal cord ([place]) 11/29/2017   Hypertension    Protein-calorie malnutrition, severe ([place]) 01/24/2018   S/P insertion of IVC (inferior vena caval) filter 01/24/2018   Tetraparesis ([place])     Past Surgical History:  Procedure Laterality Date   ANTERIOR CERVICAL DECOMP/DISCECTOMY FUSION N/A 11/29/2017   Procedure: Cervical five-six, six-seven Anterior Cervical Decompression Fusion;  Surgeon: Judith Part, MD;  Location: [place];  Service: Neurosurgery;  Laterality: N/A;  Cervical five-six, six-seven Anterior Cervical Decompression Fusion   CATARACT EXTRACTION     IR IVC FILTER PLMT / S&I /IMG GUID/MOD SED  12/08/2017    There were no vitals filed for this visit.   Subjective Assessment - 07/30/20 1248     Subjective Patient reports having a fair day. Reports her daughter took her brace to have the straps replaced so  patient presents with normal tennis shoe on left LE today.    Pertinent History Pt is an 86 yo female that fell in 2019, fractured vertebrae in her neck and in her low back per family. 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 After hospital stay, pt discharged to [facility] for ~1 month, experienced severe UTI as well as bilateral DVT (IVC filter placed). Discharged to [facility], stayed for about a year, and then moved to [facility] in April 2021. Was receiving PT, but per family facility reported that did not have adequate equipment to maximize PT for her. Pt until about 1 month ago was practicing sit to stand transfers with 1-2 people, and working on static standing. Has a brace for L foot due to PF. Pt currently needs assistance with all ADLs (able to assist with donning/doffing her shirt), bed baths, and needs a hoyer lift for transfers. Able to drive power wheelchair without assistance.    Limitations Standing;Walking;House hold activities;Lifting    How long can you sit comfortably? NA    How long can you stand comfortably? unable    How long can you walk comfortably? unable    Patient Stated Goals "to stand up and walk"    Currently in Pain? No/denies             Interventions:   *patient unable to stand or transfer due to no  brace on left foot today as daughter took the brace to [employer]thotic shop for repairs.  Patient peformed LE Strengthening in seated position.  AAROM B hip march x 12 reps x 1 set (going through full ROM)  AROM B Hip march x 12 reps x 1 set (patient able to achieve minimal ROM)  Hip add squeeze on bal - hold 5 sec x 10 reps. Resistive hip add/abd BLE using YTB x 10reps each x 2 sets. Knee ext AROM on left x 12 x 1 set; progressed to 2lb AW x 12 reps. Knee ext AAROM on right x 12 reps to achieve full ROM, and AROM on right x 12 reps.  Active ankle DF/PF on left x 12 reps x 2 sets AAROM ankle DF/PF on right x 12 reps x 2 sets  Education provided  throughout session via VC/TC and demonstration to facilitate movement at target joints and correct muscle activation for all testing and exercises performed.   Clinical Impression: Patient performed well with seated therex- able to progress from AAROM to some resistive therex and able to follow all cues. Emphasized importance of slow and controlled movement to maximize strength potential. She verbalized understanding and able to return demonstration. Patient will benefit from continued PT services to increase mobility and strength to improve patient's quality of life                          PT Education - 07/30/20 1251     Education Details Seated LE strengthening techniques    Person(s) Educated Patient    Methods Explanation;Demonstration;Tactile cues;Verbal cues    Comprehension Verbalized understanding;Returned demonstration;Verbal cues required;Need further instruction;Tactile cues required              PT Short Term Goals - 03/10/20 2211       PT SHORT TERM GOAL #1   Title The patient will perform initial HEP with minimum assistance in order to improve strength and function.    Baseline Patient demonstrating independence with initial HEP as of 03/10/2020 with no questions or difficulty.    Time 4    Period Weeks    Status Achieved    Target Date 02/11/20               PT Long Term Goals - 07/02/20 1734       PT LONG TERM GOAL #1   Title The patient will be compliant with finalized HEP with minimum assistance in preparation for self management and maintenance of condition.    Baseline 03/10/2020- Patient familiar with initial HEP and will keep goal active as HEP becomes more progressive including possible transfers and standing activities. 04/07/2020- Patient continues to report compliance with LE strengthening home exercises without questions or concerns. 04/21/2020- Patient able to verbalize and demonstrate good understanding of current HEP  including some supine and seated LE strengthening exercises. Reviewed and patient performed 10 reps today with minimal cueing. Will keep goal active to progress HEP as appropriate. 05/28/2020- patient reports continues to perform LE strengthening as able and some help from caregiver as able. No changes at this time to home program as patient is limited to supine/seated therex. 07/02/2020- Patient continues to report compliance with current supine and seated LE strengtheing home program and states no questions or concerns regarding her plan.    Time 12    Period Weeks    Status Achieved      PT LONG TERM GOAL #2  Title The patient will demonstrate at least 10 point improvement on FOTO score indicating an improved ability to perform functional activities.    Baseline on eval (/720) score 12; 10/22/19: 12; 12/10/19: 18, 12/13: 18: 04/07/2020- Will assess next visit. 07/02/2020- Will obtain next visit.    Time 12    Period Weeks    Status On-going    Target Date 09/24/20      PT LONG TERM GOAL #3   Title The patient will demonstrate lateral scooting  for assistance with transfers with minA to maximize independence.    Baseline 10/22/19: Pt requires modA+1 for lateral scooting; 12/10/19: modA+1, 12/13: modA-maxA, 12/27: modA-maxA, 03/10/2020- ModA-MaxA- increased verbal cues and visual demonstration. 04/07/2020- Continued Max assist to perform forward/lateral scoot. 04/21/2020- Patient was able to demo slight lateral scoot with CGA approx 12 in then fatigued requiring max assist- she is able to scoot forward by leaning without significant issues. 05/28/2020- While sitting in chair- patient able to perform forward and lateral scoot with VC/visual demo and increased time allowed- CGA today but not consistent yet- will keep goal active at this time to continue to focus on strengthening and improving technique. 07/02/2020- Patient able to demonstrate minimal ability to laterally shift weigh on mat table requiring max assist  yet is able to forward scoot out to edge of mat table with min Assist.    Time 12    Period Weeks    Status On-going    Target Date 09/24/20      PT LONG TERM GOAL #4   Title The patient will demonstrate a squat pivot transfer with minimum assistance to maximize independence and mobility. 07/02/2020- Patient is able to perform a stand pivot transfer with max assist- was able to minimally pivot on left LE today with transfer.    Baseline 10/22/19: unable to safely attempt at this time; 12/10/19: unable to safely attempt at this time, 01/14/20: unable to safety attempt at this time. 03/10/2020- Patient able to perform Stand pivot transfer with Maximal assist. 04/07/2020- Patient continues to require Max assist with sit to stand- will assist SPT next visit. 04/21/2020- Patient presents with Max assist to perform Sit to stand Transfer- unable to pivot or move Left LE well without difficulty.    Time 12    Period Weeks    Status On-going    Target Date 09/24/20      PT LONG TERM GOAL #5   Title The patient will demonstrate sitting without UE support for 2-5 minutes at EOB to improve participation and maximize independence with ADLs.    Baseline 10/22/19: Pt able to sit unsupported for at least 2 minutes at edge of bed. 04/21/2020- Patient continues to demo good sitting at edge of mat > 5 miin without difficulty or back support.    Time 12    Period Weeks    Status Achieved      PT LONG TERM GOAL #6   Title Patient will demonstrate improved functional LE strength as seen by consistent ability to stand > 2 min (3 out of 4 trials) with Mod/Max A for improved LE strength with transfers.    Baseline 05/28/2020- Patient able to inconsistent stand 1-2 min right now in //bars with Mod/Max A. 07/02/2020- Patient was able to progress last 2 visit to standing using bilateral platform attachment between 1-2 min which is a progression from the parallel bars.    Time 12    Period Weeks    Status On-going  Target Date  09/24/20                   Plan - 07/30/20 0908     Clinical Impression Statement Patient performed well with seated therex- able to progress from AAROM to some resistive therex and able to follow all cues. Emphasized importance of slow and controlled movement to maximize strength potential. She verbalized understanding and able to return demonstration. Patient will benefit from continued PT services to increase mobility and strength to improve patient's quality of life    Personal Factors and Comorbidities Age;Time since onset of injury/illness/exacerbation;Comorbidity 3+;Fitness    Comorbidities HTN, quadriparesis, history of DVT, neurogenic bladder    Examination-Activity Limitations Bathing;Hygiene/Grooming;Squat;Bed Mobility;Lift;Bend;Stand;Engineer, manufacturing;Toileting;Self Feeding;Transfers;Continence;Sit;Dressing;Sleep;Carry    Examination-Participation Restrictions Church;Laundry;Cleaning;Medication Management;Community Activity;Meal Prep;Interpersonal Relationship    Stability/Clinical Decision Making Evolving/Moderate complexity    Rehab Potential Fair    PT Frequency 2x / week    PT Duration 12 weeks    PT Treatment/Interventions ADLs/Self Care Home Management;Electrical Stimulation;Therapeutic activities;Wheelchair mobility training;Vasopneumatic Device;Joint Manipulations;Vestibular;Passive range of motion;Patient/family education;Therapeutic exercise;DME Instruction;Biofeedback;Aquatic Therapy;Moist Heat;Gait training;Balance training;Orthotic Fit/Training;Dry needling;Energy conservation;Taping;Splinting;Neuromuscular re-education;Cryotherapy;Ultrasound;Functional mobility training    PT Next Visit Plan Continue with progressive Standing/transfer training.    PT Home Exercise Plan no changes    Consulted and Agree with Plan of Care Patient             Patient will benefit from skilled therapeutic intervention in order to improve the following deficits  and impairments:  Abnormal gait, Decreased balance, Decreased endurance, Decreased mobility, Difficulty walking, Hypomobility, Impaired sensation, Decreased range of motion, Improper body mechanics, Impaired perceived functional ability, Decreased activity tolerance, Decreased knowledge of use of DME, Decreased safety awareness, Decreased strength, Impaired flexibility, Impaired UE functional use, Postural dysfunction  Visit Diagnosis: Abnormality of gait and mobility  Difficulty in walking, not elsewhere classified  Muscle weakness (generalized)     Problem List Patient Active Problem List   Diagnosis Date Noted   Acute appendicitis with appendiceal abscess 05/30/2018   Hypocalcemia 02/15/2018   Dysuria 02/15/2018   Bilateral lower extremity edema 02/11/2018   Vaginal yeast infection 01/30/2018   Chest tightness 01/27/2018   Healthcare-associated pneumonia 01/25/2018   Pleural effusion, not elsewhere classified 01/25/2018   Acute deep vein thrombosis (DVT) of left lower extremity ([place]) 01/24/2018   Acute deep vein thrombosis (DVT) of right lower extremity ([place]) 01/24/2018   Chronic allergic rhinitis 01/24/2018   Depression with anxiety 01/24/2018   UTI due to Klebsiella species 01/24/2018   Protein-calorie malnutrition, severe ([place]) 01/24/2018   S/P insertion of IVC (inferior vena caval) filter 01/24/2018   Reactive depression    Hypokalemia    Leukocytosis    Essential hypertension    Trauma    Tetraparesis (HCC)    Neuropathic pain    Neurogenic bowel    Neurogenic bladder    Benign essential HTN    Acute blood loss anemia    Central cord syndrome at C6 level of cervical spinal cord ([place]) 11/29/2017   Central cord syndrome ([place]) 11/29/2017    Lewis Moccasin, PT 07/31/2020, 9:16 AM  [place] MAIN [facility] SERVICES [address] [place], [place], [address] Phone: [phone]   Fax:  [phone]  Name: ARIYAH SEDLACK MRN: [id number] Date of Birth: 1935-01-18

## 2020-08-06 ENCOUNTER — Ambulatory Visit: Payer: Medicare HMO | Attending: Internal Medicine | Admitting: Occupational Therapy

## 2020-08-06 ENCOUNTER — Ambulatory Visit: Payer: Medicare HMO

## 2020-08-06 ENCOUNTER — Other Ambulatory Visit: Payer: Self-pay

## 2020-08-06 ENCOUNTER — Encounter: Payer: Self-pay | Admitting: Occupational Therapy

## 2020-08-06 DIAGNOSIS — R262 Difficulty in walking, not elsewhere classified: Secondary | ICD-10-CM | POA: Diagnosis present

## 2020-08-06 DIAGNOSIS — S14129S Central cord syndrome at unspecified level of cervical spinal cord, sequela: Secondary | ICD-10-CM | POA: Insufficient documentation

## 2020-08-06 DIAGNOSIS — R278 Other lack of coordination: Secondary | ICD-10-CM | POA: Diagnosis present

## 2020-08-06 DIAGNOSIS — M6281 Muscle weakness (generalized): Secondary | ICD-10-CM | POA: Diagnosis not present

## 2020-08-06 DIAGNOSIS — R269 Unspecified abnormalities of gait and mobility: Secondary | ICD-10-CM | POA: Diagnosis present

## 2020-08-06 DIAGNOSIS — R2689 Other abnormalities of gait and mobility: Secondary | ICD-10-CM | POA: Insufficient documentation

## 2020-08-06 NOTE — Therapy (Signed)
[place] MAIN [facility] SERVICES [address] [place], [place], [address] Phone: [phone]   Fax:  [phone]  Occupational Therapy Treatment  Patient Details  Name: Sherry [name] MRN: [id number] Date of Birth: 03/08/1934 No data recorded  Encounter Date: 08/06/2020   OT End of Session - 08/06/20 1315     Visit Number 52    Number of Visits 6    Date for OT Re-Evaluation 10/22/20    Authorization Time Period Progress report period starting 07/30/2020    OT Start Time 1100    OT Stop Time 1145    OT Time Calculation (min) 45 min    Activity Tolerance Patient tolerated treatment well    Behavior During Therapy [facility] for tasks assessed/performed             Past Medical History:  Diagnosis Date   Acute blood loss anemia    Arthritis    Cancer ([place])    skin   Central cord syndrome at C6 level of cervical spinal cord ([place]) 11/29/2017   Hypertension    Protein-calorie malnutrition, severe ([place]) 01/24/2018   S/P insertion of IVC (inferior vena caval) filter 01/24/2018   Tetraparesis ([place])     Past Surgical History:  Procedure Laterality Date   ANTERIOR CERVICAL DECOMP/DISCECTOMY FUSION N/A 11/29/2017   Procedure: Cervical five-six, six-seven Anterior Cervical Decompression Fusion;  Surgeon: Judith Part, MD;  Location: [place];  Service: Neurosurgery;  Laterality: N/A;  Cervical five-six, six-seven Anterior Cervical Decompression Fusion   CATARACT EXTRACTION     IR IVC FILTER PLMT / S&I /IMG GUID/MOD SED  12/08/2017    There were no vitals filed for this visit.   Subjective Assessment - 08/06/20 1314     Subjective  Pt. reports having had a nice weekend    Patient is accompanied by: Family member    Pertinent History Patient s/p fall November 29, 2017 resulting in diagnosis of central cord syndrome at C 6 level.  She has had therapy in multiple venues but with recent move to [facility], therapy staff recommended she seek  outpatient therapy for her needs.    Currently in Pain? No/denies            OT Treatment   Pt. tolerated bilateral shoulder flexion, extension, abduction, elbow flexion, extension forearm supination AAROM/AROM. PROM bilateral wrist extension, digit MP, PIP, and DIP flexion, and extension in conjunction with moist heat. Pt. worked on bilateral UE reaching with the right and Left UE using the horizontal [employer]. Pt. worked on reaching with incorporating her core to improve trunk control while reaching up, and sliding them to the right through 3 horizontal rungs to the right, and 3 rungs to the left.    Pt. continues to make progress overall, and is improving with BUE ROM, and functional reaching. Pt. tolerated ROM, moist heat modality, and functional reaching without pain. Pt. was able to move the Sa[place]o rings through 3 horizontal rungs with both the right, and left UEs today x's 1 rep each with her right, and left UE's, and through 2 horizontal rungs with each UE. Pt. continues to progress with her RUE, and hand when performing self-feeding with adpative equipment. Pt. continues to be able to use her right thumb to hold items. Pt. continues to benefit from working on improving ROM in order to work towards increasing engagement of her bilateral hands during ADLs, and IADL tasks.  OT Education - 08/06/20 1315     Education Details BUE ROM with functional reaching    Person(s) Educated Patient;Caregiver(s)    Methods Explanation;Demonstration    Comprehension Verbalized understanding;Need further instruction                 OT Long Term Goals - 07/30/20 1139       OT LONG TERM GOAL #1   Title Patient and caregiver will demonstrate understanding of home exercise program for ROM.    Baseline Pt. continues to have a restorative aide rehab aide assist her wih exercises at [facility]    Time 12    Period Weeks    Status On-going     Target Date 10/22/20      OT LONG TERM GOAL #4   Title Patient will demonstrate improved composite finger flexion to hold deodorant to apply to underarms.    Baseline Pt. continues to present with improving finger flexion of R hand for a partial gross grasp, but unable to grasp and hold items. pt. conitnues to present with limited digit flexion of L hand. Pt. has active left thumb abduction.    Time 12    Period Weeks    Status On-going    Target Date 10/22/20      Long Term Additional Goals   Additional Long Term Goals Yes      OT LONG TERM GOAL #6   Title Patient will increase right UE ROM to comb the back of her hair with modified independence.    Baseline 07/30/2020: Pt. is now able to comb the back of her hair with a pic, however continues to work on completing the task efficiently.    Time 12    Period Weeks    Status Partially Met    Target Date 10/22/20      OT LONG TERM GOAL #7   Title Pt. will independently, and efficiently send text messages on her phone.    Baseline 07/30/2020: Pt. continues to present with limited BUE strength, and [facility] skills.    Time 12    Period Weeks    Status New    Target Date 07/30/20      OT LONG TERM GOAL #8   Title Pt. will write, and sign her name with 100% legibility, and modified independence.    Baseline Pt. continues to  consistently filling out her daily menu, and writing for crossword puzzles. Pt. is able to maintain grasp on a wide width pen. [place] to work on Media planner.    Time 12    Period Weeks    Status On-going    Target Date 10/22/20                   Plan - 08/06/20 1316     Clinical Impression Statement Pt. continues to make progress overall, and is improving with BUE ROM, and functional reaching.  Pt. tolerated ROM, moist heat modality, and functional reaching without pain. Pt. was able to move the Saebo rings through 3 horizontal rungs with both the right, and left UEs today x's 1 rep each with her  right, and left UE's, and through 2 horizontal rungs with each UE. Pt. continues to progress with her RUE, and hand when performing self-feeding with adpative equipment. Pt. continues to be able to use her right thumb to hold items. Pt. continues to benefit from working on improving ROM in order to work towards increasing engagement of her bilateral hands  during ADLs, and IADL tasks.     OT Occupational Profile and History Detailed Assessment- Review of Records and additional review of physical, cognitive, psychosocial history related to current functional performance    Occupational performance deficits (Please refer to evaluation for details): ADL's;IADL's;Leisure;Social Participation    Body Structure / Function / Physical Skills ADL;Continence;Dexterity;Flexibility;Strength;ROM;Balance;Coordination;FMC;IADL;Endurance;UE functional use;Decreased knowledge of use of DME;GMC    Psychosocial Skills Environmental  Adaptations;Habits;Routines and Behaviors    Rehab Potential Fair    Clinical Decision Making Limited treatment options, no task modification necessary    Comorbidities Affecting Occupational Performance: Presence of comorbidities impacting occupational performance    Comorbidities impacting occupational performance description: contractures of bilateral hands, dependent transfers,    Modification or Assistance to Complete Evaluation  No modification of tasks or assist necessary to complete eval    OT Frequency 2x / week    OT Duration 12 weeks    OT Treatment/Interventions Self-care/ADL training;Cryotherapy;Therapeutic exercise;DME and/or AE instruction;Balance training;Neuromuscular education;Manual Therapy;Splinting;Moist Heat;Passive range of motion;Therapeutic activities;Patient/family education    Consulted and Agree with Plan of Care Patient             Patient will benefit from skilled therapeutic intervention in order to improve the following deficits and impairments:   Body  Structure / Function / Physical Skills: ADL, Continence, Dexterity, Flexibility, Strength, ROM, Balance, Coordination, FMC, IADL, Endurance, UE functional use, Decreased knowledge of use of DME, GMC   Psychosocial Skills: Environmental  Adaptations, Habits, Routines and Behaviors   Visit Diagnosis: Muscle weakness (generalized)    Problem List Patient Active Problem List   Diagnosis Date Noted   Acute appendicitis with appendiceal abscess 05/30/2018   Hypocalcemia 02/15/2018   Dysuria 02/15/2018   Bilateral lower extremity edema 02/11/2018   Vaginal yeast infection 01/30/2018   Chest tightness 01/27/2018   Healthcare-associated pneumonia 01/25/2018   Pleural effusion, not elsewhere classified 01/25/2018   Acute deep vein thrombosis (DVT) of left lower extremity ([place]) 01/24/2018   Acute deep vein thrombosis (DVT) of right lower extremity ([place]) 01/24/2018   Chronic allergic rhinitis 01/24/2018   Depression with anxiety 01/24/2018   UTI due to Klebsiella species 01/24/2018   Protein-calorie malnutrition, severe ([place]) 01/24/2018   S/P insertion of IVC (inferior vena caval) filter 01/24/2018   Reactive depression    Hypokalemia    Leukocytosis    Essential hypertension    Trauma    Tetraparesis (HCC)    Neuropathic pain    Neurogenic bowel    Neurogenic bladder    Benign essential HTN    Acute blood loss anemia    Central cord syndrome at C6 level of cervical spinal cord ([place]) 11/29/2017   Central cord syndrome ([place]) 11/29/2017    Harrel Carina, MS, OTR/L 08/06/2020, 1:22 PM  [place] MAIN [facility] SERVICES [address] [place], [place], [address] Phone: [phone]   Fax:  [phone]  Name: Sherry [name] MRN: [id number] Date of Birth: March 28, 1934

## 2020-08-06 NOTE — Therapy (Signed)
[place] MAIN [facility] SERVICES [address] [place], [place], [address] Phone: [phone]   Fax:  [phone]  Physical Therapy Treatment  Patient Details  Name: Sherry [name] MRN: [id number] Date of Birth: February 03, 1934 Referring Provider (PT): Viviana Simpler   Encounter Date: 08/06/2020   PT End of Session - 08/06/20 1138     Visit Number 75    Number of Visits 104    Date for PT Re-Evaluation 09/24/20    Authorization Type aetna medicare FOTO performed by PT on eval (7/20), score 12, Progress note on 03/10/2020    Authorization Time Period Recert 6/0/1093- 2/35/5732; PN on 07/07/2020    PT Start Time 1146    PT Stop Time 1228    PT Time Calculation (min) 42 min    Equipment Utilized During Treatment Gait belt    Activity Tolerance Patient tolerated treatment well;Patient limited by fatigue    Behavior During Therapy WFL for tasks assessed/performed             Past Medical History:  Diagnosis Date   Acute blood loss anemia    Arthritis    Cancer ([place])    skin   Central cord syndrome at C6 level of cervical spinal cord ([place]) 11/29/2017   Hypertension    Protein-calorie malnutrition, severe ([place]) 01/24/2018   S/P insertion of IVC (inferior vena caval) filter 01/24/2018   Tetraparesis ([place])     Past Surgical History:  Procedure Laterality Date   ANTERIOR CERVICAL DECOMP/DISCECTOMY FUSION N/A 11/29/2017   Procedure: Cervical five-six, six-seven Anterior Cervical Decompression Fusion;  Surgeon: Judith Part, MD;  Location: [place];  Service: Neurosurgery;  Laterality: N/A;  Cervical five-six, six-seven Anterior Cervical Decompression Fusion   CATARACT EXTRACTION     IR IVC FILTER PLMT / S&I /IMG GUID/MOD SED  12/08/2017    There were no vitals filed for this visit.   Subjective Assessment - 08/06/20 1137     Subjective Patient reports feeling okay today. States no pain and nothing new to report.    Pertinent History Pt  is an 86 yo female that fell in 2019, fractured vertebrae in her neck and in her low back per family. 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 After hospital stay, pt discharged to [facility] for ~1 month, experienced severe UTI as well as bilateral DVT (IVC filter placed). Discharged to [facility], stayed for about a year, and then moved to [facility] in April 2021. Was receiving PT, but per family facility reported that did not have adequate equipment to maximize PT for her. Pt until about 1 month ago was practicing sit to stand transfers with 1-2 people, and working on static standing. Has a brace for L foot due to PF. Pt currently needs assistance with all ADLs (able to assist with donning/doffing her shirt), bed baths, and needs a hoyer lift for transfers. Able to drive power wheelchair without assistance.    Limitations Standing;Walking;House hold activities;Lifting    How long can you sit comfortably? NA    How long can you stand comfortably? unable    How long can you walk comfortably? unable    Patient Stated Goals "to stand up and walk"    Currently in Pain? No/denies             [facility] MAIN [facility] SERVICES [address] [place], [place], [address] Phone: [phone]   Fax:  [phone]  Physical Therapy Treatment  Patient Details  Name: Sherry [name] MRN: [id number] Date of Birth: July 11, 1934 Referring Provider (PT): Viviana Simpler   Encounter Date: 08/06/2020   PT End of Session - 08/06/20 1138     Visit Number 40    Number of Visits 104    Date for PT Re-Evaluation 09/24/20    Authorization Type aetna medicare FOTO performed by PT on eval (7/20), score 12, Progress note on 03/10/2020    Authorization Time Period Recert 06/06/3873- 6/43/3295; PN on 07/07/2020    PT Start Time 1146    PT Stop Time 1228    PT Time Calculation (min) 42 min    Equipment Utilized During Treatment Gait belt    Activity Tolerance Patient  tolerated treatment well;Patient limited by fatigue    Behavior During Therapy WFL for tasks assessed/performed             Past Medical History:  Diagnosis Date   Acute blood loss anemia    Arthritis    Cancer ([place])    skin   Central cord syndrome at C6 level of cervical spinal cord ([place]) 11/29/2017   Hypertension    Protein-calorie malnutrition, severe ([place]) 01/24/2018   S/P insertion of IVC (inferior vena caval) filter 01/24/2018   Tetraparesis ([place])     Past Surgical History:  Procedure Laterality Date   ANTERIOR CERVICAL DECOMP/DISCECTOMY FUSION N/A 11/29/2017   Procedure: Cervical five-six, six-seven Anterior Cervical Decompression Fusion;  Surgeon: Judith Part, MD;  Location: [place];  Service: Neurosurgery;  Laterality: N/A;  Cervical five-six, six-seven Anterior Cervical Decompression Fusion   CATARACT EXTRACTION     IR IVC FILTER PLMT / S&I /IMG GUID/MOD SED  12/08/2017    There were no vitals filed for this visit.   Subjective Assessment - 08/06/20 1137     Subjective Patient reports feeling okay today. States no pain and nothing new to report.    Pertinent History Pt is an 86 yo female that fell in 2019, fractured vertebrae in her neck and in her low back per family. 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 After hospital stay, pt discharged to [facility] for ~1 month, experienced severe UTI as well as bilateral DVT (IVC filter placed). Discharged to [facility], stayed for about a year, and then moved to [facility] in April 2021. Was receiving PT, but per family facility reported that did not have adequate equipment to maximize PT for her. Pt until about 1 month ago was practicing sit to stand transfers with 1-2 people, and working on static standing. Has a brace for L foot due to PF. Pt currently needs assistance with all ADLs (able to assist with donning/doffing her shirt), bed baths, and needs a hoyer lift for transfers. Able to drive power  wheelchair without assistance.    Limitations Standing;Walking;House hold activities;Lifting    How long can you sit comfortably? NA    How long can you stand comfortably? unable    How long can you walk comfortably? unable    Patient Stated Goals "to stand up and walk"    Currently in Pain? No/denies             Transfer training: Stand pivot transfer x 2 trials-Patient able to move feet slight to turn at more of a 45 deg angle for safer transfer. On 2nd trial she was able to stand with PT and pivot her right foot and minimally move her left which she has been previously unable.  Standing at  edge of mat  using bilateral Platform walker- x4 trials: 1 min 55 sec; 3 min 45 sec; 4 min 45 sec, and 106min 38 sec- focusing on extending hips and anterior weight shift. Patient able to shift her hips with CGA and VC.   Clinical Impression: Patient continues to demonstrate progress as seen by ability to pivot her feet better during SPT. She was also able to anterior shift her pelvis today with less physical assist and maintain well with VC and CGA. Patient will benefit from continued PT services to increase mobility and strength to improve patient's quality of life.                          PT Education - 08/06/20 1138     Education Details exercise technique    Person(s) Educated Patient    Methods Explanation;Demonstration;Tactile cues;Verbal cues    Comprehension Verbalized understanding;Returned demonstration;Verbal cues required;Tactile cues required;Need further instruction              PT Short Term Goals - 03/10/20 2211       PT SHORT TERM GOAL #1   Title The patient will perform initial HEP with minimum assistance in order to improve strength and function.    Baseline Patient demonstrating independence with initial HEP as of 03/10/2020 with no questions or difficulty.    Time 4    Period Weeks    Status Achieved    Target Date 02/11/20                PT Long Term Goals - 07/02/20 1734       PT LONG TERM GOAL #1   Title The patient will be compliant with finalized HEP with minimum assistance in preparation for self management and maintenance of condition.    Baseline 03/10/2020- Patient familiar with initial HEP and will keep goal active as HEP becomes more progressive including possible transfers and standing activities. 04/07/2020- Patient continues to report compliance with LE strengthening home exercises without questions or concerns. 04/21/2020- Patient able to verbalize and demonstrate good understanding of current HEP including some supine and seated LE strengthening exercises. Reviewed and patient performed 10 reps today with minimal cueing. Will keep goal active to progress HEP as appropriate. 05/28/2020- patient reports continues to perform LE strengthening as able and some help from caregiver as able. No changes at this time to home program as patient is limited to supine/seated therex. 07/02/2020- Patient continues to report compliance with current supine and seated LE strengtheing home program and states no questions or concerns regarding her plan.    Time 12    Period Weeks    Status Achieved      PT LONG TERM GOAL #2   Title The patient will demonstrate at least 10 point improvement on FOTO score indicating an improved ability to perform functional activities.    Baseline on eval (/720) score 12; 10/22/19: 12; 12/10/19: 18, 12/13: 18: 04/07/2020- Will assess next visit. 07/02/2020- Will obtain next visit.    Time 12    Period Weeks    Status On-going    Target Date 09/24/20      PT LONG TERM GOAL #3   Title The patient will demonstrate lateral scooting  for assistance with transfers with minA to maximize independence.    Baseline 10/22/19: Pt requires modA+1 for lateral scooting; 12/10/19: modA+1, 12/13: modA-maxA, 12/27: modA-maxA, 03/10/2020- ModA-MaxA- increased verbal cues and visual demonstration. 04/07/2020- Continued Max  assist to perform forward/lateral scoot. 04/21/2020- Patient was able to demo slight lateral scoot with CGA approx 12 in then fatigued requiring max assist- she is able to scoot forward by leaning without significant issues. 05/28/2020- While sitting in chair- patient able to perform forward and lateral scoot with VC/visual demo and increased time allowed- CGA today but not consistent yet- will keep goal active at this time to continue to focus on strengthening and improving technique. 07/02/2020- Patient able to demonstrate minimal ability to laterally shift weigh on mat table requiring max assist yet is able to forward scoot out to edge of mat table with min Assist.    Time 12    Period Weeks    Status On-going    Target Date 09/24/20      PT LONG TERM GOAL #4   Title The patient will demonstrate a squat pivot transfer with minimum assistance to maximize independence and mobility. 07/02/2020- Patient is able to perform a stand pivot transfer with max assist- was able to minimally pivot on left LE today with transfer.    Baseline 10/22/19: unable to safely attempt at this time; 12/10/19: unable to safely attempt at this time, 01/14/20: unable to safety attempt at this time. 03/10/2020- Patient able to perform Stand pivot transfer with Maximal assist. 04/07/2020- Patient continues to require Max assist with sit to stand- will assist SPT next visit. 04/21/2020- Patient presents with Max assist to perform Sit to stand Transfer- unable to pivot or move Left LE well without difficulty.    Time 12    Period Weeks    Status On-going    Target Date 09/24/20      PT LONG TERM GOAL #5   Title The patient will demonstrate sitting without UE support for 2-5 minutes at EOB to improve participation and maximize independence with ADLs.    Baseline 10/22/19: Pt able to sit unsupported for at least 2 minutes at edge of bed. 04/21/2020- Patient continues to demo good sitting at edge of mat > 5 miin without difficulty or back  support.    Time 12    Period Weeks    Status Achieved      PT LONG TERM GOAL #6   Title Patient will demonstrate improved functional LE strength as seen by consistent ability to stand > 2 min (3 out of 4 trials) with Mod/Max A for improved LE strength with transfers.    Baseline 05/28/2020- Patient able to inconsistent stand 1-2 min right now in //bars with Mod/Max A. 07/02/2020- Patient was able to progress last 2 visit to standing using bilateral platform attachment between 1-2 min which is a progression from the parallel bars.    Time 12    Period Weeks    Status On-going    Target Date 09/24/20                   Plan - 08/06/20 1138     Clinical Impression Statement Patient continues to demonstrate progress as seen by ability to pivot her feet better during SPT. She was also able to anterior shift her pelvis today with less physical assist and maintain well with VC and CGA. Patient will benefit from continued PT services to increase mobility and strength to improve patient's quality of life.    Personal Factors and Comorbidities Age;Time since onset of injury/illness/exacerbation;Comorbidity 3+;Fitness    Comorbidities HTN, quadriparesis, history of DVT, neurogenic bladder    Examination-Activity Limitations Bathing;Hygiene/Grooming;Squat;Bed Mobility;Lift;Bend;Stand;Engineer, manufacturing;Toileting;Self Feeding;Transfers;Continence;Sit;Dressing;Sleep;Carry  Examination-Participation Restrictions Church;Laundry;Cleaning;Medication Management;Community Activity;Meal Prep;Interpersonal Relationship    Stability/Clinical Decision Making Evolving/Moderate complexity    Rehab Potential Fair    PT Frequency 2x / week    PT Duration 12 weeks    PT Treatment/Interventions ADLs/Self Care Home Management;Electrical Stimulation;Therapeutic activities;Wheelchair mobility training;Vasopneumatic Device;Joint Manipulations;Vestibular;Passive range of motion;Patient/family  education;Therapeutic exercise;DME Instruction;Biofeedback;Aquatic Therapy;Moist Heat;Gait training;Balance training;Orthotic Fit/Training;Dry needling;Energy conservation;Taping;Splinting;Neuromuscular re-education;Cryotherapy;Ultrasound;Functional mobility training    PT Next Visit Plan Continue with progressive Standing/transfer training.    PT Home Exercise Plan no changes    Consulted and Agree with Plan of Care Patient             Patient will benefit from skilled therapeutic intervention in order to improve the following deficits and impairments:  Abnormal gait, Decreased balance, Decreased endurance, Decreased mobility, Difficulty walking, Hypomobility, Impaired sensation, Decreased range of motion, Improper body mechanics, Impaired perceived functional ability, Decreased activity tolerance, Decreased knowledge of use of DME, Decreased safety awareness, Decreased strength, Impaired flexibility, Impaired UE functional use, Postural dysfunction  Visit Diagnosis: Abnormality of gait and mobility  Difficulty in walking, not elsewhere classified  Muscle weakness (generalized)     Problem List Patient Active Problem List   Diagnosis Date Noted   Acute appendicitis with appendiceal abscess 05/30/2018   Hypocalcemia 02/15/2018   Dysuria 02/15/2018   Bilateral lower extremity edema 02/11/2018   Vaginal yeast infection 01/30/2018   Chest tightness 01/27/2018   Healthcare-associated pneumonia 01/25/2018   Pleural effusion, not elsewhere classified 01/25/2018   Acute deep vein thrombosis (DVT) of left lower extremity ([place]) 01/24/2018   Acute deep vein thrombosis (DVT) of right lower extremity ([place]) 01/24/2018   Chronic allergic rhinitis 01/24/2018   Depression with anxiety 01/24/2018   UTI due to Klebsiella species 01/24/2018   Protein-calorie malnutrition, severe ([place]) 01/24/2018   S/P insertion of IVC (inferior vena caval) filter 01/24/2018   Reactive depression    Hypokalemia     Leukocytosis    Essential hypertension    Trauma    Tetraparesis ([facility])    Neuropathic pain    Neurogenic bowel    Neurogenic bladder    Benign essential HTN    Acute blood loss anemia    Central cord syndrome at C6 level of cervical spinal cord ([place]) 11/29/2017   Central cord syndrome ([place]) 11/29/2017    Lewis Moccasin, PT 08/07/2020, 8:51 AM  [facility] [address] [place], [place], [address] Phone: [phone]   Fax:  [phone]  Name: Sherry [name] MRN: [id number] Date of Birth: 09-10-1934                             PT Education - 08/06/20 1138     Education Details exercise technique    Person(s) Educated Patient    Methods Explanation;Demonstration;Tactile cues;Verbal cues    Comprehension Verbalized understanding;Returned demonstration;Verbal cues required;Tactile cues required;Need further instruction              PT Short Term Goals - 03/10/20 2211       PT SHORT TERM GOAL #1   Title The patient will perform initial HEP with minimum assistance in order to improve strength and function.    Baseline Patient demonstrating independence with initial HEP as of 03/10/2020 with no questions or difficulty.    Time 4    Period Weeks    Status Achieved    Target Date 02/11/20  PT Long Term Goals - 07/02/20 1734       PT LONG TERM GOAL #1   Title The patient will be compliant with finalized HEP with minimum assistance in preparation for self management and maintenance of condition.    Baseline 03/10/2020- Patient familiar with initial HEP and will keep goal active as HEP becomes more progressive including possible transfers and standing activities. 04/07/2020- Patient continues to report compliance with LE strengthening home exercises without questions or concerns. 04/21/2020- Patient able to verbalize and demonstrate good understanding of current HEP  including some supine and seated LE strengthening exercises. Reviewed and patient performed 10 reps today with minimal cueing. Will keep goal active to progress HEP as appropriate. 05/28/2020- patient reports continues to perform LE strengthening as able and some help from caregiver as able. No changes at this time to home program as patient is limited to supine/seated therex. 07/02/2020- Patient continues to report compliance with current supine and seated LE strengtheing home program and states no questions or concerns regarding her plan.    Time 12    Period Weeks    Status Achieved      PT LONG TERM GOAL #2   Title The patient will demonstrate at least 10 point improvement on FOTO score indicating an improved ability to perform functional activities.    Baseline on eval (/720) score 12; 10/22/19: 12; 12/10/19: 18, 12/13: 18: 04/07/2020- Will assess next visit. 07/02/2020- Will obtain next visit.    Time 12    Period Weeks    Status On-going    Target Date 09/24/20      PT LONG TERM GOAL #3   Title The patient will demonstrate lateral scooting  for assistance with transfers with minA to maximize independence.    Baseline 10/22/19: Pt requires modA+1 for lateral scooting; 12/10/19: modA+1, 12/13: modA-maxA, 12/27: modA-maxA, 03/10/2020- ModA-MaxA- increased verbal cues and visual demonstration. 04/07/2020- Continued Max assist to perform forward/lateral scoot. 04/21/2020- Patient was able to demo slight lateral scoot with CGA approx 12 in then fatigued requiring max assist- she is able to scoot forward by leaning without significant issues. 05/28/2020- While sitting in chair- patient able to perform forward and lateral scoot with VC/visual demo and increased time allowed- CGA today but not consistent yet- will keep goal active at this time to continue to focus on strengthening and improving technique. 07/02/2020- Patient able to demonstrate minimal ability to laterally shift weigh on mat table requiring max assist  yet is able to forward scoot out to edge of mat table with min Assist.    Time 12    Period Weeks    Status On-going    Target Date 09/24/20      PT LONG TERM GOAL #4   Title The patient will demonstrate a squat pivot transfer with minimum assistance to maximize independence and mobility. 07/02/2020- Patient is able to perform a stand pivot transfer with max assist- was able to minimally pivot on left LE today with transfer.    Baseline 10/22/19: unable to safely attempt at this time; 12/10/19: unable to safely attempt at this time, 01/14/20: unable to safety attempt at this time. 03/10/2020- Patient able to perform Stand pivot transfer with Maximal assist. 04/07/2020- Patient continues to require Max assist with sit to stand- will assist SPT next visit. 04/21/2020- Patient presents with Max assist to perform Sit to stand Transfer- unable to pivot or move Left LE well without difficulty.    Time 12    Period Weeks  Status On-going    Target Date 09/24/20      PT LONG TERM GOAL #5   Title The patient will demonstrate sitting without UE support for 2-5 minutes at EOB to improve participation and maximize independence with ADLs.    Baseline 10/22/19: Pt able to sit unsupported for at least 2 minutes at edge of bed. 04/21/2020- Patient continues to demo good sitting at edge of mat > 5 miin without difficulty or back support.    Time 12    Period Weeks    Status Achieved      PT LONG TERM GOAL #6   Title Patient will demonstrate improved functional LE strength as seen by consistent ability to stand > 2 min (3 out of 4 trials) with Mod/Max A for improved LE strength with transfers.    Baseline 05/28/2020- Patient able to inconsistent stand 1-2 min right now in //bars with Mod/Max A. 07/02/2020- Patient was able to progress last 2 visit to standing using bilateral platform attachment between 1-2 min which is a progression from the parallel bars.    Time 12    Period Weeks    Status On-going    Target Date  09/24/20                   Plan - 08/06/20 1138     Clinical Impression Statement Patient continues to demonstrate progress as seen by ability to pivot her feet better during SPT. She was also able to anterior shift her pelvis today with less physical assist and maintain well with VC and CGA. Patient will benefit from continued PT services to increase mobility and strength to improve patient's quality of life.    Personal Factors and Comorbidities Age;Time since onset of injury/illness/exacerbation;Comorbidity 3+;Fitness    Comorbidities HTN, quadriparesis, history of DVT, neurogenic bladder    Examination-Activity Limitations Bathing;Hygiene/Grooming;Squat;Bed Mobility;Lift;Bend;Stand;Engineer, manufacturing;Toileting;Self Feeding;Transfers;Continence;Sit;Dressing;Sleep;Carry    Examination-Participation Restrictions Church;Laundry;Cleaning;Medication Management;Community Activity;Meal Prep;Interpersonal Relationship    Stability/Clinical Decision Making Evolving/Moderate complexity    Rehab Potential Fair    PT Frequency 2x / week    PT Duration 12 weeks    PT Treatment/Interventions ADLs/Self Care Home Management;Electrical Stimulation;Therapeutic activities;Wheelchair mobility training;Vasopneumatic Device;Joint Manipulations;Vestibular;Passive range of motion;Patient/family education;Therapeutic exercise;DME Instruction;Biofeedback;Aquatic Therapy;Moist Heat;Gait training;Balance training;Orthotic Fit/Training;Dry needling;Energy conservation;Taping;Splinting;Neuromuscular re-education;Cryotherapy;Ultrasound;Functional mobility training    PT Next Visit Plan Continue with progressive Standing/transfer training.    PT Home Exercise Plan no changes    Consulted and Agree with Plan of Care Patient             Patient will benefit from skilled therapeutic intervention in order to improve the following deficits and impairments:  Abnormal gait, Decreased balance, Decreased  endurance, Decreased mobility, Difficulty walking, Hypomobility, Impaired sensation, Decreased range of motion, Improper body mechanics, Impaired perceived functional ability, Decreased activity tolerance, Decreased knowledge of use of DME, Decreased safety awareness, Decreased strength, Impaired flexibility, Impaired UE functional use, Postural dysfunction  Visit Diagnosis: Abnormality of gait and mobility  Difficulty in walking, not elsewhere classified  Muscle weakness (generalized)     Problem List Patient Active Problem List   Diagnosis Date Noted   Acute appendicitis with appendiceal abscess 05/30/2018   Hypocalcemia 02/15/2018   Dysuria 02/15/2018   Bilateral lower extremity edema 02/11/2018   Vaginal yeast infection 01/30/2018   Chest tightness 01/27/2018   Healthcare-associated pneumonia 01/25/2018   Pleural effusion, not elsewhere classified 01/25/2018   Acute deep vein thrombosis (DVT) of left lower extremity ([place]) 01/24/2018   Acute deep  vein thrombosis (DVT) of right lower extremity (HCC) 01/24/2018   Chronic allergic rhinitis 01/24/2018   Depression with anxiety 01/24/2018   UTI due to Klebsiella species 01/24/2018   Protein-calorie malnutrition, severe ([place]) 01/24/2018   S/P insertion of IVC (inferior vena caval) filter 01/24/2018   Reactive depression    Hypokalemia    Leukocytosis    Essential hypertension    Trauma    Tetraparesis (HCC)    Neuropathic pain    Neurogenic bowel    Neurogenic bladder    Benign essential HTN    Acute blood loss anemia    Central cord syndrome at C6 level of cervical spinal cord ([place]) 11/29/2017   Central cord syndrome ([place]) 11/29/2017    Lewis Moccasin, PT 08/07/2020, 8:51 AM  [place] MAIN [facility] SERVICES [place], [place], [address] Phone: [phone]   Fax:  [phone]  Name: Sherry [name] MRN: [id number] Date of Birth: 1934-06-15

## 2020-08-07 DIAGNOSIS — K592 Neurogenic bowel, not elsewhere classified: Secondary | ICD-10-CM | POA: Diagnosis not present

## 2020-08-07 DIAGNOSIS — I82729 Chronic embolism and thrombosis of deep veins of unspecified upper extremity: Secondary | ICD-10-CM | POA: Diagnosis not present

## 2020-08-07 DIAGNOSIS — M159 Polyosteoarthritis, unspecified: Secondary | ICD-10-CM

## 2020-08-07 DIAGNOSIS — F39 Unspecified mood [affective] disorder: Secondary | ICD-10-CM | POA: Diagnosis not present

## 2020-08-07 DIAGNOSIS — G8220 Paraplegia, unspecified: Secondary | ICD-10-CM | POA: Diagnosis not present

## 2020-08-07 DIAGNOSIS — I10 Essential (primary) hypertension: Secondary | ICD-10-CM

## 2020-08-11 ENCOUNTER — Ambulatory Visit: Payer: Medicare HMO | Admitting: Occupational Therapy

## 2020-08-11 ENCOUNTER — Encounter: Payer: Self-pay | Admitting: Occupational Therapy

## 2020-08-11 ENCOUNTER — Encounter: Payer: Self-pay | Admitting: Physical Therapy

## 2020-08-11 ENCOUNTER — Other Ambulatory Visit: Payer: Self-pay

## 2020-08-11 ENCOUNTER — Ambulatory Visit: Payer: Medicare HMO | Admitting: Physical Therapy

## 2020-08-11 DIAGNOSIS — R2689 Other abnormalities of gait and mobility: Secondary | ICD-10-CM

## 2020-08-11 DIAGNOSIS — R269 Unspecified abnormalities of gait and mobility: Secondary | ICD-10-CM

## 2020-08-11 DIAGNOSIS — M6281 Muscle weakness (generalized): Secondary | ICD-10-CM | POA: Diagnosis not present

## 2020-08-11 DIAGNOSIS — R262 Difficulty in walking, not elsewhere classified: Secondary | ICD-10-CM

## 2020-08-11 DIAGNOSIS — R278 Other lack of coordination: Secondary | ICD-10-CM

## 2020-08-11 NOTE — Therapy (Signed)
[place] MAIN [facility] SERVICES [address] [place], [place], [address] Phone: [phone]   Fax:  [phone]  Occupational Therapy Treatment  Patient Details  Name: Sherry [name] MRN: [id number] Date of Birth: [birth year] No data recorded  Encounter Date: 08/11/2020   OT End of Session - 08/11/20 1114     Visit Number 54    Number of Visits 75    Date for OT Re-Evaluation 10/22/20    Authorization Time Period Progress report period starting 07/30/2020    OT Start Time 1107    OT Stop Time 1145    OT Time Calculation (min) 38 min    Activity Tolerance Patient tolerated treatment well    Behavior During Therapy [facility] for tasks assessed/performed             Past Medical History:  Diagnosis Date   Acute blood loss anemia    Arthritis    Cancer ([place])    skin   Central cord syndrome at C6 level of cervical spinal cord ([place]) 11/29/2017   Hypertension    Protein-calorie malnutrition, severe ([place]) 01/24/2018   S/P insertion of IVC (inferior vena caval) filter 01/24/2018   Tetraparesis ([place])     Past Surgical History:  Procedure Laterality Date   ANTERIOR CERVICAL DECOMP/DISCECTOMY FUSION N/A 11/29/2017   Procedure: Cervical five-six, six-seven Anterior Cervical Decompression Fusion;  Surgeon: Judith Part, MD;  Location: [place];  Service: Neurosurgery;  Laterality: N/A;  Cervical five-six, six-seven Anterior Cervical Decompression Fusion   CATARACT EXTRACTION     IR IVC FILTER PLMT / S&I /IMG GUID/MOD SED  12/08/2017    There were no vitals filed for this visit.   Subjective Assessment - 08/11/20 1114     Subjective  Pt. reports having had a nice weekend    Patient is accompanied by: Family member    Pertinent History Patient s/p fall November 29, 2017 resulting in diagnosis of central cord syndrome at C 6 level.  She has had therapy in multiple venues but with recent move to [facility], therapy staff recommended she seek  outpatient therapy for her needs.    Currently in Pain? No/denies            OT Treatment   Pt. tolerated bilateral shoulder flexion, extension, abduction, elbow flexion, extension forearm supination AAROM/AROM. PROM bilateral wrist extension, digit MP, PIP, and DIP flexion, and extension in conjunction with moist heat. Pt. worked on reaching using the [employer]. Pt. Grasped and moved the shapes through 2 vertical dowels of varying heights.   Pt. presents with no pain today, and was able to tolerate ROM, moist heat modality, and functional reaching without pain. Pt. was able  to move the shapes through 2 vertical dowels with both the right, and left UEs today.  Pt. continues to progress with her RUE, and hand when performing self-feeding with adpative equipment. Pt. continues to be able to use her right thumb to hold items. Pt. tolerated ROM well without reports of pain, or discomfort. Pt. continues to benefit from working on improving ROM in order to work towards increasing engagement of her bilateral hands during ADLs, and IADL tasks.                         OT Education - 08/11/20 1114     Education Details BUE ROM with functional reaching    Person(s) Educated Patient;Caregiver(s)    Methods Explanation;Demonstration  Comprehension Verbalized understanding;Need further instruction                 OT Long Term Goals - 07/30/20 1139       OT LONG TERM GOAL #1   Title Patient and caregiver will demonstrate understanding of home exercise program for ROM.    Baseline Pt. continues to have a restorative aide rehab aide assist her wih exercises at [facility]    Time 12    Period Weeks    Status On-going    Target Date 10/22/20      OT LONG TERM GOAL #4   Title Patient will demonstrate improved composite finger flexion to hold deodorant to apply to underarms.    Baseline Pt. continues to present with improving finger flexion of R hand for a partial gross  grasp, but unable to grasp and hold items. pt. conitnues to present with limited digit flexion of L hand. Pt. has active left thumb abduction.    Time 12    Period Weeks    Status On-going    Target Date 10/22/20      Long Term Additional Goals   Additional Long Term Goals Yes      OT LONG TERM GOAL #6   Title Patient will increase right UE ROM to comb the back of her hair with modified independence.    Baseline 07/30/2020: Pt. is now able to comb the back of her hair with a pic, however continues to work on completing the task efficiently.    Time 12    Period Weeks    Status Partially Met    Target Date 10/22/20      OT LONG TERM GOAL #7   Title Pt. will independently, and efficiently send text messages on her phone.    Baseline 07/30/2020: Pt. continues to present with limited BUE strength, and [facility] skills.    Time 12    Period Weeks    Status New    Target Date 07/30/20      OT LONG TERM GOAL #8   Title Pt. will write, and sign her name with 100% legibility, and modified independence.    Baseline Pt. continues to  consistently filling out her daily menu, and writing for crossword puzzles. Pt. is able to maintain grasp on a wide width pen. [place] to work on [employer].    Time 12    Period Weeks    Status On-going    Target Date 10/22/20                   Plan - 08/11/20 1115     Clinical Impression Statement Pt. presents with no pain today, and was able to tolerate ROM, moist heat modality, and functional reaching without pain. Pt. was able  to move the shapes through 2 vertical dowels with both the right, and left UEs today.  Pt. continues to progress with her RUE, and hand when performing self-feeding with adpative equipment. Pt. continues to be able to use her right thumb to hold items. Pt. tolerated ROM well without reports of pain, or discomfort. Pt. continues to benefit from working on improving ROM in order to work towards increasing engagement of her  bilateral hands during ADLs, and IADL tasks.       OT Occupational Profile and History Detailed Assessment- Review of Records and additional review of physical, cognitive, psychosocial history related to current functional performance    Occupational performance deficits (Please refer to evaluation  for details): ADL's;IADL's;Leisure;Social Participation    Body Structure / Function / Physical Skills ADL;Continence;Dexterity;Flexibility;Strength;ROM;Balance;Coordination;FMC;IADL;Endurance;UE functional use;Decreased knowledge of use of DME;GMC    Psychosocial Skills Environmental  Adaptations;Habits;Routines and Behaviors    Rehab Potential Fair    Clinical Decision Making Several treatment options, min-mod task modification necessary    Comorbidities Affecting Occupational Performance: Presence of comorbidities impacting occupational performance    Comorbidities impacting occupational performance description: contractures of bilateral hands, dependent transfers,    Modification or Assistance to Complete Evaluation  No modification of tasks or assist necessary to complete eval    OT Frequency 2x / week    OT Duration 12 weeks    OT Treatment/Interventions Self-care/ADL training;Cryotherapy;Therapeutic exercise;DME and/or AE instruction;Balance training;Neuromuscular education;Manual Therapy;Splinting;Moist Heat;Passive range of motion;Therapeutic activities;Patient/family education    Consulted and Agree with Plan of Care Patient             Patient will benefit from skilled therapeutic intervention in order to improve the following deficits and impairments:   Body Structure / Function / Physical Skills: ADL, Continence, Dexterity, Flexibility, Strength, ROM, Balance, Coordination, FMC, IADL, Endurance, UE functional use, Decreased knowledge of use of DME, GMC   Psychosocial Skills: Environmental  Adaptations, Habits, Routines and Behaviors   Visit Diagnosis: Muscle weakness  (generalized)  Other lack of coordination    Problem List Patient Active Problem List   Diagnosis Date Noted   Acute appendicitis with appendiceal abscess 05/30/2018   Hypocalcemia 02/15/2018   Dysuria 02/15/2018   Bilateral lower extremity edema 02/11/2018   Vaginal yeast infection 01/30/2018   Chest tightness 01/27/2018   Healthcare-associated pneumonia 01/25/2018   Pleural effusion, not elsewhere classified 01/25/2018   Acute deep vein thrombosis (DVT) of left lower extremity ([place]) 01/24/2018   Acute deep vein thrombosis (DVT) of right lower extremity ([place]) 01/24/2018   Chronic allergic rhinitis 01/24/2018   Depression with anxiety 01/24/2018   UTI due to Klebsiella species 01/24/2018   Protein-calorie malnutrition, severe ([place]) 01/24/2018   S/P insertion of IVC (inferior vena caval) filter 01/24/2018   Reactive depression    Hypokalemia    Leukocytosis    Essential hypertension    Trauma    Tetraparesis (HCC)    Neuropathic pain    Neurogenic bowel    Neurogenic bladder    Benign essential HTN    Acute blood loss anemia    Central cord syndrome at C6 level of cervical spinal cord ([place]) 11/29/2017   Central cord syndrome ([place]) 11/29/2017    Harrel Carina, MS, OTR/L 08/11/2020, 12:42 PM  [place] MAIN [facility] SERVICES [address] [place], [place], [address] Phone: [phone]   Fax:  [phone]  Name: Sherry [name] MRN: [id number] Date of Birth: [birth year]

## 2020-08-11 NOTE — Therapy (Signed)
[place] MAIN [facility] SERVICES [address] [place], [place], [address] Phone: [phone]   Fax:  [phone]  Physical Therapy Treatment  Patient Details  Name: Sherry [name] MRN: [id number] Date of Birth: 11/01/1934 Referring Provider (PT): Viviana Simpler   Encounter Date: 08/11/2020   PT End of Session - 08/11/20 1142     Visit Number [id number]    Number of Visits 104    Date for PT Re-Evaluation 09/24/20    Authorization Type aetna medicare FOTO performed by PT on eval (7/20), score 12, Progress note on 03/10/2020    Authorization Time Period Recert 04/09/4663- 9/93/5701; PN on 07/07/2020    PT Start Time 1147    PT Stop Time 1227    PT Time Calculation (min) 40 min    Equipment Utilized During Treatment Gait belt    Activity Tolerance Patient tolerated treatment well;Patient limited by fatigue    Behavior During Therapy WFL for tasks assessed/performed             Past Medical History:  Diagnosis Date   Acute blood loss anemia    Arthritis    Cancer ([place])    skin   Central cord syndrome at C6 level of cervical spinal cord ([place]) 11/29/2017   Hypertension    Protein-calorie malnutrition, severe ([place]) 01/24/2018   S/P insertion of IVC (inferior vena caval) filter 01/24/2018   Tetraparesis ([place])     Past Surgical History:  Procedure Laterality Date   ANTERIOR CERVICAL DECOMP/DISCECTOMY FUSION N/A 11/29/2017   Procedure: Cervical five-six, six-seven Anterior Cervical Decompression Fusion;  Surgeon: Judith Part, MD;  Location: [place];  Service: Neurosurgery;  Laterality: N/A;  Cervical five-six, six-seven Anterior Cervical Decompression Fusion   CATARACT EXTRACTION     IR IVC FILTER PLMT / S&I /IMG GUID/MOD SED  12/08/2017    There were no vitals filed for this visit.   Subjective Assessment - 08/11/20 1357     Subjective Patient reports feeling okay today. No new complaints. She states, "Lets work on exercise for my legs.  I want them to get stronger."    Pertinent History Pt is an 86 yo female that fell in 2019, fractured vertebrae in her neck and in her low back per family. 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 After hospital stay, pt discharged to [facility] for ~1 month, experienced severe UTI as well as bilateral DVT (IVC filter placed). Discharged to [facility], stayed for about a year, and then moved to [facility] in April 2021. Was receiving PT, but per family facility reported that did not have adequate equipment to maximize PT for her. Pt until about 1 month ago was practicing sit to stand transfers with 1-2 people, and working on static standing. Has a brace for L foot due to PF. Pt currently needs assistance with all ADLs (able to assist with donning/doffing her shirt), bed baths, and needs a hoyer lift for transfers. Able to drive power wheelchair without assistance.    Limitations Standing;Walking;House hold activities;Lifting    How long can you sit comfortably? NA    How long can you stand comfortably? unable    How long can you walk comfortably? unable    Patient Stated Goals "to stand up and walk"    Currently in Pain? No/denies               TREATMENT:    Patient instructed in LE Strengthening in seated position.   AAROM  B hip march x 10 reps x 1 set (going through full ROM) AROM B Hip march x 10 reps x 1 set (patient able to achieve minimal ROM) Required min VCs to increase core stabilization for better hip flexor activation;   Isometric Resistive hip abd BLE 3 sec holdx 10reps each x 1 sets. Hamstring curl yellow tband LLE 2x10 Attempted RLE hamstring curl, unable; Patient instructed in RLE knee extension/flexion x5 reps;  Patient transferred to mat table: Stand pivot with max A+2 with cues for positioning. Patient transferred to right side for better ability  Patient required mod A +1 for transition sit to supine:  Patient supine: Hooklying: Bridge x15 reps x2  sets with mod A for positioning SLR hip flexion x10 reps each LE, AAROM, patient able to initiate ROM but required AAROM to achieve full ROM.  Lumbar trunk rotation x10 reps each direction with min A for positioning;    Education provided throughout session via VC/TC and demonstration to facilitate movement at target joints and correct muscle activation for all testing and exercises performed.   Patient required max A +2 for transition to sitting;  Required max A +2 for stand pivot transfer mat table to wheelchair to left side with good tolerance;    Clinical Impression: Patient performed well with seated therex- able to progress from AAROM to some resistive therex and able to follow all cues. Emphasized importance of slow and controlled movement to maximize strength potential. She verbalized understanding and able to return demonstration. Patient will benefit from continued PT services to increase mobility and strength to improve patient's quality of life                          PT Education - 08/11/20 1142     Education Details exercise technique/positioning;    Person(s) Educated Patient    Methods Explanation;Verbal cues    Comprehension Verbalized understanding;Returned demonstration;Verbal cues required;Need further instruction              PT Short Term Goals - 03/10/20 2211       PT SHORT TERM GOAL #1   Title The patient will perform initial HEP with minimum assistance in order to improve strength and function.    Baseline Patient demonstrating independence with initial HEP as of 03/10/2020 with no questions or difficulty.    Time 4    Period Weeks    Status Achieved    Target Date 02/11/20               PT Long Term Goals - 07/02/20 1734       PT LONG TERM GOAL #1   Title The patient will be compliant with finalized HEP with minimum assistance in preparation for self management and maintenance of condition.    Baseline 03/10/2020- Patient  familiar with initial HEP and will keep goal active as HEP becomes more progressive including possible transfers and standing activities. 04/07/2020- Patient continues to report compliance with LE strengthening home exercises without questions or concerns. 04/21/2020- Patient able to verbalize and demonstrate good understanding of current HEP including some supine and seated LE strengthening exercises. Reviewed and patient performed 10 reps today with minimal cueing. Will keep goal active to progress HEP as appropriate. 05/28/2020- patient reports continues to perform LE strengthening as able and some help from caregiver as able. No changes at this time to home program as patient is limited to supine/seated therex. 07/02/2020- Patient continues to report  compliance with current supine and seated LE strengtheing home program and states no questions or concerns regarding her plan.    Time 12    Period Weeks    Status Achieved      PT LONG TERM GOAL #2   Title The patient will demonstrate at least 10 point improvement on FOTO score indicating an improved ability to perform functional activities.    Baseline on eval (/720) score 12; 10/22/19: 12; 12/10/19: 18, 12/13: 18: 04/07/2020- Will assess next visit. 07/02/2020- Will obtain next visit.    Time 12    Period Weeks    Status On-going    Target Date 09/24/20      PT LONG TERM GOAL #3   Title The patient will demonstrate lateral scooting  for assistance with transfers with minA to maximize independence.    Baseline 10/22/19: Pt requires modA+1 for lateral scooting; 12/10/19: modA+1, 12/13: modA-maxA, 12/27: modA-maxA, 03/10/2020- ModA-MaxA- increased verbal cues and visual demonstration. 04/07/2020- Continued Max assist to perform forward/lateral scoot. 04/21/2020- Patient was able to demo slight lateral scoot with CGA approx 12 in then fatigued requiring max assist- she is able to scoot forward by leaning without significant issues. 05/28/2020- While sitting in chair-  patient able to perform forward and lateral scoot with VC/visual demo and increased time allowed- CGA today but not consistent yet- will keep goal active at this time to continue to focus on strengthening and improving technique. 07/02/2020- Patient able to demonstrate minimal ability to laterally shift weigh on mat table requiring max assist yet is able to forward scoot out to edge of mat table with min Assist.    Time 12    Period Weeks    Status On-going    Target Date 09/24/20      PT LONG TERM GOAL #4   Title The patient will demonstrate a squat pivot transfer with minimum assistance to maximize independence and mobility. 07/02/2020- Patient is able to perform a stand pivot transfer with max assist- was able to minimally pivot on left LE today with transfer.    Baseline 10/22/19: unable to safely attempt at this time; 12/10/19: unable to safely attempt at this time, 01/14/20: unable to safety attempt at this time. 03/10/2020- Patient able to perform Stand pivot transfer with Maximal assist. 04/07/2020- Patient continues to require Max assist with sit to stand- will assist SPT next visit. 04/21/2020- Patient presents with Max assist to perform Sit to stand Transfer- unable to pivot or move Left LE well without difficulty.    Time 12    Period Weeks    Status On-going    Target Date 09/24/20      PT LONG TERM GOAL #5   Title The patient will demonstrate sitting without UE support for 2-5 minutes at EOB to improve participation and maximize independence with ADLs.    Baseline 10/22/19: Pt able to sit unsupported for at least 2 minutes at edge of bed. 04/21/2020- Patient continues to demo good sitting at edge of mat > 5 miin without difficulty or back support.    Time 12    Period Weeks    Status Achieved      PT LONG TERM GOAL #6   Title Patient will demonstrate improved functional LE strength as seen by consistent ability to stand > 2 min (3 out of 4 trials) with Mod/Max A for improved LE strength with  transfers.    Baseline 05/28/2020- Patient able to inconsistent stand 1-2 min right now in //bars  with Mod/Max A. 07/02/2020- Patient was able to progress last 2 visit to standing using bilateral platform attachment between 1-2 min which is a progression from the parallel bars.    Time 12    Period Weeks    Status On-going    Target Date 09/24/20                   Plan - 08/11/20 1401     Clinical Impression Statement Patient motivated and participated well within session. She was instructed in advanced LE strengthening focusing on hip strengthening to improve hip control in standing. patient does require AAROM initially to achieve full ROM and then was able to performed AROM within partial range due to weakness. She does require min VCs for proper exercise technique and positioning. Patient was Max A +2 for stand pivot transfer wheelchair to mat table. Instructed patient and caregiver to work on improving posterior hip strengthening for better trunk control in standing. Patient did fatigue quickly with LE exercise exhibiting decreased ROM following 10 reps. She would benefit from additional skilled PT Intervention to improve strength and mobility;    Personal Factors and Comorbidities Age;Time since onset of injury/illness/exacerbation;Comorbidity 3+;Fitness    Comorbidities HTN, quadriparesis, history of DVT, neurogenic bladder    Examination-Activity Limitations Bathing;Hygiene/Grooming;Squat;Bed Mobility;Lift;Bend;Stand;Engineer, manufacturing;Toileting;Self Feeding;Transfers;Continence;Sit;Dressing;Sleep;Carry    Examination-Participation Restrictions Church;Laundry;Cleaning;Medication Management;Community Activity;Meal Prep;Interpersonal Relationship    Stability/Clinical Decision Making Evolving/Moderate complexity    Rehab Potential Fair    PT Frequency 2x / week    PT Duration 12 weeks    PT Treatment/Interventions ADLs/Self Care Home Management;Electrical  Stimulation;Therapeutic activities;Wheelchair mobility training;Vasopneumatic Device;Joint Manipulations;Vestibular;Passive range of motion;Patient/family education;Therapeutic exercise;DME Instruction;Biofeedback;Aquatic Therapy;Moist Heat;Gait training;Balance training;Orthotic Fit/Training;Dry needling;Energy conservation;Taping;Splinting;Neuromuscular re-education;Cryotherapy;Ultrasound;Functional mobility training    PT Next Visit Plan Continue with progressive Standing/transfer training.    PT Home Exercise Plan no changes    Consulted and Agree with Plan of Care Patient             Patient will benefit from skilled therapeutic intervention in order to improve the following deficits and impairments:  Abnormal gait, Decreased balance, Decreased endurance, Decreased mobility, Difficulty walking, Hypomobility, Impaired sensation, Decreased range of motion, Improper body mechanics, Impaired perceived functional ability, Decreased activity tolerance, Decreased knowledge of use of DME, Decreased safety awareness, Decreased strength, Impaired flexibility, Impaired UE functional use, Postural dysfunction  Visit Diagnosis: Muscle weakness (generalized)  Other lack of coordination  Abnormality of gait and mobility  Difficulty in walking, not elsewhere classified  Other abnormalities of gait and mobility     Problem List Patient Active Problem List   Diagnosis Date Noted   Acute appendicitis with appendiceal abscess 05/30/2018   Hypocalcemia 02/15/2018   Dysuria 02/15/2018   Bilateral lower extremity edema 02/11/2018   Vaginal yeast infection 01/30/2018   Chest tightness 01/27/2018   Healthcare-associated pneumonia 01/25/2018   Pleural effusion, not elsewhere classified 01/25/2018   Acute deep vein thrombosis (DVT) of left lower extremity ([place]) 01/24/2018   Acute deep vein thrombosis (DVT) of right lower extremity ([place]) 01/24/2018   Chronic allergic rhinitis 01/24/2018    Depression with anxiety 01/24/2018   UTI due to Klebsiella species 01/24/2018   Protein-calorie malnutrition, severe ([place]) 01/24/2018   S/P insertion of IVC (inferior vena caval) filter 01/24/2018   Reactive depression    Hypokalemia    Leukocytosis    Essential hypertension    Trauma    Tetraparesis ([facility])    Neuropathic pain    Neurogenic bowel  Neurogenic bladder    Benign essential HTN    Acute blood loss anemia    Central cord syndrome at C6 level of cervical spinal cord ([place]) 11/29/2017   Central cord syndrome ([place]) 11/29/2017    Hadi Dubin PT, DPT 08/11/2020, 2:17 PM  [place] MAIN [facility] SERVICES [address] [place], [place], [address] Phone: [phone]   Fax:  [phone]  Name: Sherry [name] MRN: [id number] Date of Birth: 1934/12/11

## 2020-08-13 ENCOUNTER — Encounter: Payer: Self-pay | Admitting: Occupational Therapy

## 2020-08-13 ENCOUNTER — Ambulatory Visit: Payer: Medicare HMO | Admitting: Physical Therapy

## 2020-08-13 ENCOUNTER — Ambulatory Visit: Payer: Medicare HMO | Admitting: Occupational Therapy

## 2020-08-13 ENCOUNTER — Other Ambulatory Visit: Payer: Self-pay

## 2020-08-13 DIAGNOSIS — M6281 Muscle weakness (generalized): Secondary | ICD-10-CM

## 2020-08-13 DIAGNOSIS — R2689 Other abnormalities of gait and mobility: Secondary | ICD-10-CM

## 2020-08-13 DIAGNOSIS — S14129S Central cord syndrome at unspecified level of cervical spinal cord, sequela: Secondary | ICD-10-CM

## 2020-08-13 DIAGNOSIS — R278 Other lack of coordination: Secondary | ICD-10-CM

## 2020-08-13 DIAGNOSIS — R269 Unspecified abnormalities of gait and mobility: Secondary | ICD-10-CM

## 2020-08-13 DIAGNOSIS — R262 Difficulty in walking, not elsewhere classified: Secondary | ICD-10-CM

## 2020-08-13 NOTE — Therapy (Signed)
[place] MAIN [facility] SERVICES [address] [place], [place], [address] Phone: [phone]   Fax:  [phone]  Occupational Therapy Treatment  Patient Details  Name: Sherry [name] MRN: [id number] Date of Birth: 01-16-1935 No data recorded  Encounter Date: 08/13/2020   OT End of Session - 08/13/20 1156     Visit Number [id number]    Number of Visits 68    Date for OT Re-Evaluation 10/22/20    Authorization Time Period Progress report period starting 07/30/2020    OT Start Time 1100    OT Stop Time 1143    OT Time Calculation (min) 43 min    Activity Tolerance Patient tolerated treatment well    Behavior During Therapy [facility] for tasks assessed/performed             Past Medical History:  Diagnosis Date   Acute blood loss anemia    Arthritis    Cancer ([place])    skin   Central cord syndrome at C6 level of cervical spinal cord ([place]) 11/29/2017   Hypertension    Protein-calorie malnutrition, severe ([place]) 01/24/2018   S/P insertion of IVC (inferior vena caval) filter 01/24/2018   Tetraparesis ([place])     Past Surgical History:  Procedure Laterality Date   ANTERIOR CERVICAL DECOMP/DISCECTOMY FUSION N/A 11/29/2017   Procedure: Cervical five-six, six-seven Anterior Cervical Decompression Fusion;  Surgeon: Judith Part, MD;  Location: [place];  Service: Neurosurgery;  Laterality: N/A;  Cervical five-six, six-seven Anterior Cervical Decompression Fusion   CATARACT EXTRACTION     IR IVC FILTER PLMT / S&I /IMG GUID/MOD SED  12/08/2017    There were no vitals filed for this visit.   Subjective Assessment - 08/13/20 1155     Subjective  Pt. reports having had a nice visit with her husbands grandchildren visiting    Patient is accompanied by: Family member    Pertinent History Patient s/p fall November 29, 2017 resulting in diagnosis of central cord syndrome at C 6 level.  She has had therapy in multiple venues but with recent move to [facility], therapy staff recommended she seek outpatient therapy for her needs.    Patient Stated Goals Patient would like to be able to move better in bed, stand and perform self care tasks.    Currently in Pain? No/denies            OT Treatment   Pt. tolerated bilateral shoulder flexion, extension, abduction, elbow flexion, extension forearm supination AAROM/AROM. PROM bilateral wrist extension, digit MP, PIP, and DIP flexion, and extension in conjunction with moist heat. Pt. worked on reaching using the [employer]. Pt. Grasped and moved the shapes through 2 vertical dowels of varying heights.    Pt. reports hat her arms are fatigued today. Pt. was able to tolerate ROM, moist heat modality, and functional reaching without pain. Pt. was able  to move the shapes through 2 vertical dowels with both the right, and left UEs today. Pt. Required fewer cues for compensation proximally at the hips.  Pt. continues to progress with her RUE, and hand when performing self-feeding with adpative equipment. Pt. continues to be able to use her right thumb to hold items. Pt. tolerated ROM well without reports of pain, or discomfort. Pt. continues to benefit from working on improving ROM in order to work towards increasing engagement of her bilateral hands during ADLs, and IADL tasks.  OT Education - 08/13/20 1156     Education Details BUE ROM with functional reaching    Person(s) Educated Patient;Caregiver(s)    Methods Explanation;Demonstration    Comprehension Verbalized understanding;Need further instruction                 OT Long Term Goals - 07/30/20 1139       OT LONG TERM GOAL #1   Title Patient and caregiver will demonstrate understanding of home exercise program for ROM.    Baseline Pt. continues to have a restorative aide rehab aide assist her wih exercises at [facility]    Time 12    Period Weeks    Status On-going    Target Date 10/22/20       OT LONG TERM GOAL #4   Title Patient will demonstrate improved composite finger flexion to hold deodorant to apply to underarms.    Baseline Pt. continues to present with improving finger flexion of R hand for a partial gross grasp, but unable to grasp and hold items. pt. conitnues to present with limited digit flexion of L hand. Pt. has active left thumb abduction.    Time 12    Period Weeks    Status On-going    Target Date 10/22/20      Long Term Additional Goals   Additional Long Term Goals Yes      OT LONG TERM GOAL #6   Title Patient will increase right UE ROM to comb the back of her hair with modified independence.    Baseline 07/30/2020: Pt. is now able to comb the back of her hair with a pic, however continues to work on completing the task efficiently.    Time 12    Period Weeks    Status Partially Met    Target Date 10/22/20      OT LONG TERM GOAL #7   Title Pt. will independently, and efficiently send text messages on her phone.    Baseline 07/30/2020: Pt. continues to present with limited BUE strength, and [facility] skills.    Time 12    Period Weeks    Status New    Target Date 07/30/20      OT LONG TERM GOAL #8   Title Pt. will write, and sign her name with 100% legibility, and modified independence.    Baseline Pt. continues to  consistently filling out her daily menu, and writing for crossword puzzles. Pt. is able to maintain grasp on a wide width pen. [place] to work on Media planner.    Time 12    Period Weeks    Status On-going    Target Date 10/22/20                   Plan - 08/13/20 1157     Clinical Impression Statement Pt. reports hat her arms are fatigued today. Pt. was able to tolerate ROM, moist heat modality, and functional reaching without pain. Pt. was able  to move the shapes through 2 vertical dowels with both the right, and left UEs today. Pt. Required fewer cues for compensation proximally at the hips.  Pt. continues to progress  with her RUE, and hand when performing self-feeding with adpative equipment. Pt. continues to be able to use her right thumb to hold items. Pt. tolerated ROM well without reports of pain, or discomfort. Pt. continues to benefit from working on improving ROM in order to work towards increasing engagement of her bilateral hands during ADLs,  and IADL tasks.      OT Occupational Profile and History Detailed Assessment- Review of Records and additional review of physical, cognitive, psychosocial history related to current functional performance    Occupational performance deficits (Please refer to evaluation for details): ADL's;IADL's;Leisure;Social Participation    Body Structure / Function / Physical Skills ADL;Continence;Dexterity;Flexibility;Strength;ROM;Balance;Coordination;FMC;IADL;Endurance;UE functional use;Decreased knowledge of use of DME;GMC    Psychosocial Skills Environmental  Adaptations;Habits;Routines and Behaviors    Rehab Potential Fair    Clinical Decision Making Several treatment options, min-mod task modification necessary    Comorbidities Affecting Occupational Performance: Presence of comorbidities impacting occupational performance    Modification or Assistance to Complete Evaluation  No modification of tasks or assist necessary to complete eval    OT Frequency 2x / week    OT Duration 12 weeks    OT Treatment/Interventions Self-care/ADL training;Cryotherapy;Therapeutic exercise;DME and/or AE instruction;Balance training;Neuromuscular education;Manual Therapy;Splinting;Moist Heat;Passive range of motion;Therapeutic activities;Patient/family education    Consulted and Agree with Plan of Care Patient             Patient will benefit from skilled therapeutic intervention in order to improve the following deficits and impairments:   Body Structure / Function / Physical Skills: ADL, Continence, Dexterity, Flexibility, Strength, ROM, Balance, Coordination, FMC, IADL, Endurance, UE  functional use, Decreased knowledge of use of DME, GMC   Psychosocial Skills: Environmental  Adaptations, Habits, Routines and Behaviors   Visit Diagnosis: Muscle weakness (generalized)  Other lack of coordination    Problem List Patient Active Problem List   Diagnosis Date Noted   Acute appendicitis with appendiceal abscess 05/30/2018   Hypocalcemia 02/15/2018   Dysuria 02/15/2018   Bilateral lower extremity edema 02/11/2018   Vaginal yeast infection 01/30/2018   Chest tightness 01/27/2018   Healthcare-associated pneumonia 01/25/2018   Pleural effusion, not elsewhere classified 01/25/2018   Acute deep vein thrombosis (DVT) of left lower extremity ([place]) 01/24/2018   Acute deep vein thrombosis (DVT) of right lower extremity ([place]) 01/24/2018   Chronic allergic rhinitis 01/24/2018   Depression with anxiety 01/24/2018   UTI due to Klebsiella species 01/24/2018   Protein-calorie malnutrition, severe ([place]) 01/24/2018   S/P insertion of IVC (inferior vena caval) filter 01/24/2018   Reactive depression    Hypokalemia    Leukocytosis    Essential hypertension    Trauma    Tetraparesis (H[facility])    Neuropathic pain    Neurogenic bowel    Neurogenic bladder    Benign essential HTN    Acute blood loss anemia    Central cord syndrome at C6 level of cervical spinal cord ([place]) 11/29/2017   Central cord syndrome ([place]) 11/29/2017    Harrel Carina, MS, OTR/L 08/13/2020, 12:03 PM  [place] MAIN [facility] SERVICES [address] [place], [place], [address] Phone: [phone]   Fax:  [phone]  Name: Sherry [name] MRN: [id number] Date of Birth: Dec 28, 1934

## 2020-08-13 NOTE — Therapy (Signed)
[place] MAIN [facility] SERVICES [address] [place], [place], [address] Phone: [phone]   Fax:  [phone]  Physical Therapy Treatment  Patient Details  Name: Sherry [name] MRN: [id number] Date of Birth: 29-May-1934 Referring Provider (PT): Viviana Simpler   Encounter Date: 08/13/2020   PT End of Session - 08/13/20 1238     Visit Number 38    Number of Visits 104    Date for PT Re-Evaluation 09/24/20    Authorization Type a[employer]na medicare FOTO performed by PT on eval (7/20), score 12, Progress note on 03/10/2020    Authorization Time Period Recert 6/0/6301- 07/03/930; PN on 07/07/2020    PT Start Time 1145    PT Stop Time 1230    PT Time Calculation (min) 45 min    Equipment Utilized During Treatment Gait belt    Activity Tolerance Patient tolerated treatment well;Patient limited by fatigue    Behavior During Therapy WFL for tasks assessed/performed             Past Medical History:  Diagnosis Date   Acute blood loss anemia    Arthritis    Cancer ([place])    skin   Central cord syndrome at C6 level of cervical spinal cord ([place]) 11/29/2017   Hypertension    Protein-calorie malnutrition, severe ([place]) 01/24/2018   S/P insertion of IVC (inferior vena caval) filter 01/24/2018   Tetraparesis ([place])     Past Surgical History:  Procedure Laterality Date   ANTERIOR CERVICAL DECOMP/DISCECTOMY FUSION N/A 11/29/2017   Procedure: Cervical five-six, six-seven Anterior Cervical Decompression Fusion;  Surgeon: Judith Part, MD;  Location: [place];  Service: Neurosurgery;  Laterality: N/A;  Cervical five-six, six-seven Anterior Cervical Decompression Fusion   CATARACT EXTRACTION     IR IVC FILTER PLMT / S&I /IMG GUID/MOD SED  12/08/2017    There were no vitals filed for this visit.   Subjective Assessment - 08/13/20 1236     Subjective Patient reports she is well today. She does not have LLE brace as the velcro is being replaced. No  other changes since last session.    Pertinent History Pt is an 86 yo female that fell in 2019, fractured vertebrae in her neck and in her low back per family. 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 After hospital stay, pt discharged to [facility] for ~1 month, experienced severe UTI as well as bilateral DVT (IVC filter placed). Discharged to [facility], stayed for about a year, and then moved to [facility] in April 2021. Was receiving PT, but per family facility reported that did not have adequate equipment to maximize PT for her. Pt until about 1 month ago was practicing sit to stand transfers with 1-2 people, and working on static standing. Has a brace for L foot due to PF. Pt currently needs assistance with all ADLs (able to assist with donning/doffing her shirt), bed baths, and needs a hoyer lift for transfers. Able to drive power wheelchair without assistance.    Limitations Standing;Walking;House hold activities;Lifting    How long can you sit comfortably? NA    How long can you stand comfortably? unable    How long can you walk comfortably? unable    Patient Stated Goals "to stand up and walk"    Currently in Pain? No/denies              TREATMENT:     Patient instructed in BLE Strengthening in seated position.  AAROM B hip march x 10 reps x 2 sets (going through full ROM) AROM B Hip march x 10 reps x 2 sets (patient able to achieve minimal ROM) Required min VCs to increase core stabilization for better hip flexor activation;    Isometric hip abd BLE with manual resistance and 3 sec hold  10 reps x 2 sets; Isometric hip adduction with manual resistance and 3 sec hold 2 x 10 reps; Hamstring curl yellow tband BLE x10 reps x 2 sets Leg press from power w/c in tilted position 2 x 15 with manual resistance; Seated knee extension 1 x 10 reps BLE; Seated knee extension with soccer ball kick x20 reps each side and x30 reps alternating. VC on power and timing for  coordination of kick.     Education provided throughout session via VC/TC and demonstration to facilitate movement at target joints and correct muscle activation for all testing and exercises performed.      Clinical Impression: Pt arrived with excellent motivation today. She was not wearing her LLE brace today; due to instability at the left ankle, PT decided it was not safe to transfer to mat table due to weightbearing. Therex was progressed with increased resistance as well as sets and reps. Emphasized importance of slow and controlled movement to maximize strength potential. She verbalized understanding and able to return demonstration. Patient will benefit from continued PT services to increase mobility and strength to improve patient's quality of life        PT Short Term Goals - 03/10/20 2211       PT SHORT TERM GOAL #1   Title The patient will perform initial HEP with minimum assistance in order to improve strength and function.    Baseline Patient demonstrating independence with initial HEP as of 03/10/2020 with no questions or difficulty.    Time 4    Period Weeks    Status Achieved    Target Date 02/11/20               PT Long Term Goals - 07/02/20 1734       PT LONG TERM GOAL #1   Title The patient will be compliant with finalized HEP with minimum assistance in preparation for self management and maintenance of condition.    Baseline 03/10/2020- Patient familiar with initial HEP and will keep goal active as HEP becomes more progressive including possible transfers and standing activities. 04/07/2020- Patient continues to report compliance with LE strengthening home exercises without questions or concerns. 04/21/2020- Patient able to verbalize and demonstrate good understanding of current HEP including some supine and seated LE strengthening exercises. Reviewed and patient performed 10 reps today with minimal cueing. Will keep goal active to progress HEP as appropriate.  05/28/2020- patient reports continues to perform LE strengthening as able and some help from caregiver as able. No changes at this time to home program as patient is limited to supine/seated therex. 07/02/2020- Patient continues to report compliance with current supine and seated LE strengtheing home program and states no questions or concerns regarding her plan.    Time 12    Period Weeks    Status Achieved      PT LONG TERM GOAL #2   Title The patient will demonstrate at least 10 point improvement on FOTO score indicating an improved ability to perform functional activities.    Baseline on eval (/720) score 12; 10/22/19: 12; 12/10/19: 18, 12/13: 18: 04/07/2020- Will assess next visit. 07/02/2020- Will obtain next visit.  Time 12    Period Weeks    Status On-going    Target Date 09/24/20      PT LONG TERM GOAL #3   Title The patient will demonstrate lateral scooting  for assistance with transfers with minA to maximize independence.    Baseline 10/22/19: Pt requires modA+1 for lateral scooting; 12/10/19: modA+1, 12/13: modA-maxA, 12/27: modA-maxA, 03/10/2020- ModA-MaxA- increased verbal cues and visual demonstration. 04/07/2020- Continued Max assist to perform forward/lateral scoot. 04/21/2020- Patient was able to demo slight lateral scoot with CGA approx 12 in then fatigued requiring max assist- she is able to scoot forward by leaning without significant issues. 05/28/2020- While sitting in chair- patient able to perform forward and lateral scoot with VC/visual demo and increased time allowed- CGA today but not consistent yet- will keep goal active at this time to continue to focus on strengthening and improving technique. 07/02/2020- Patient able to demonstrate minimal ability to laterally shift weigh on mat table requiring max assist yet is able to forward scoot out to edge of mat table with min Assist.    Time 12    Period Weeks    Status On-going    Target Date 09/24/20      PT LONG TERM GOAL #4   Title  The patient will demonstrate a squat pivot transfer with minimum assistance to maximize independence and mobility. 07/02/2020- Patient is able to perform a stand pivot transfer with max assist- was able to minimally pivot on left LE today with transfer.    Baseline 10/22/19: unable to safely attempt at this time; 12/10/19: unable to safely attempt at this time, 01/14/20: unable to safety attempt at this time. 03/10/2020- Patient able to perform Stand pivot transfer with Maximal assist. 04/07/2020- Patient continues to require Max assist with sit to stand- will assist SPT next visit. 04/21/2020- Patient presents with Max assist to perform Sit to stand Transfer- unable to pivot or move Left LE well without difficulty.    Time 12    Period Weeks    Status On-going    Target Date 09/24/20      PT LONG TERM GOAL #5   Title The patient will demonstrate sitting without UE support for 2-5 minutes at EOB to improve participation and maximize independence with ADLs.    Baseline 10/22/19: Pt able to sit unsupported for at least 2 minutes at edge of bed. 04/21/2020- Patient continues to demo good sitting at edge of mat > 5 miin without difficulty or back support.    Time 12    Period Weeks    Status Achieved      PT LONG TERM GOAL #6   Title Patient will demonstrate improved functional LE strength as seen by consistent ability to stand > 2 min (3 out of 4 trials) with Mod/Max A for improved LE strength with transfers.    Baseline 05/28/2020- Patient able to inconsistent stand 1-2 min right now in //bars with Mod/Max A. 07/02/2020- Patient was able to progress last 2 visit to standing using bilateral platform attachment between 1-2 min which is a progression from the parallel bars.    Time 12    Period Weeks    Status On-going    Target Date 09/24/20                   Plan - 08/13/20 1238     Clinical Impression Statement Pt arrived with excellent motivation today. She was not wearing her LLE brace today;  due to  instability at the left ankle, PT decided it was not safe to transfer to mat table due to weightbearing. Therex was progressed with increased resistance as well as sets and reps. Emphasized importance of slow and controlled movement to maximize strength potential. She verbalized understanding and able to return demonstration. Patient will benefit from continued PT services to increase mobility and strength to improve patient's quality of life    Personal Factors and Comorbidities Age;Time since onset of injury/illness/exacerbation;Comorbidity 3+;Fitness    Comorbidities HTN, quadriparesis, history of DVT, neurogenic bladder    Examination-Activity Limitations Bathing;Hygiene/Grooming;Squat;Bed Mobility;Lift;Bend;Stand;Engineer, manufacturing;Toileting;Self Feeding;Transfers;Continence;Sit;Dressing;Sleep;Carry    Examination-Participation Restrictions Church;Laundry;Cleaning;Medication Management;Community Activity;Meal Prep;Interpersonal Relationship    Stability/Clinical Decision Making Evolving/Moderate complexity    Rehab Potential Fair    PT Frequency 2x / week    PT Duration 12 weeks    PT Treatment/Interventions ADLs/Self Care Home Management;Electrical Stimulation;Therapeutic activities;Wheelchair mobility training;Vasopneumatic Device;Joint Manipulations;Vestibular;Passive range of motion;Patient/family education;Therapeutic exercise;DME Instruction;Biofeedback;Aquatic Therapy;Moist Heat;Gait training;Balance training;Orthotic Fit/Training;Dry needling;Energy conservation;Taping;Splinting;Neuromuscular re-education;Cryotherapy;Ultrasound;Functional mobility training    PT Next Visit Plan Continue with progressive Standing/transfer training.    PT Home Exercise Plan no changes    Consulted and Agree with Plan of Care Patient             Patient will benefit from skilled therapeutic intervention in order to improve the following deficits and impairments:  Abnormal gait,  Decreased balance, Decreased endurance, Decreased mobility, Difficulty walking, Hypomobility, Impaired sensation, Decreased range of motion, Improper body mechanics, Impaired perceived functional ability, Decreased activity tolerance, Decreased knowledge of use of DME, Decreased safety awareness, Decreased strength, Impaired flexibility, Impaired UE functional use, Postural dysfunction  Visit Diagnosis: Muscle weakness (generalized)  Other lack of coordination  Abnormality of gait and mobility  Central cord syndrome, sequela (HCC)  Difficulty in walking, not elsewhere classified  Other abnormalities of gait and mobility     Problem List Patient Active Problem List   Diagnosis Date Noted   Acute appendicitis with appendiceal abscess 05/30/2018   Hypocalcemia 02/15/2018   Dysuria 02/15/2018   Bilateral lower extremity edema 02/11/2018   Vaginal yeast infection 01/30/2018   Chest tightness 01/27/2018   Healthcare-associated pneumonia 01/25/2018   Pleural effusion, not elsewhere classified 01/25/2018   Acute deep vein thrombosis (DVT) of left lower extremity ([place]) 01/24/2018   Acute deep vein thrombosis (DVT) of right lower extremity ([place]) 01/24/2018   Chronic allergic rhinitis 01/24/2018   Depression with anxiety 01/24/2018   UTI due to Klebsiella species 01/24/2018   Protein-calorie malnutrition, severe ([place]) 01/24/2018   S/P insertion of IVC (inferior vena caval) filter 01/24/2018   Reactive depression    Hypokalemia    Leukocytosis    Essential hypertension    Trauma    Tetraparesis (HCC)    Neuropathic pain    Neurogenic bowel    Neurogenic bladder    Benign essential HTN    Acute blood loss anemia    Central cord syndrome at C6 level of cervical spinal cord ([place]) 11/29/2017   Central cord syndrome ([place]) 11/29/2017   Patrina Levering PT, DPT  [facility] 08/13/2020, 12:53 PM  [place] MAIN [facility] SERVICES [address]  [place], [place], [address] Phone: [phone]   Fax:  [phone]  Name: LUCITA MONTOYA MRN: [id number] Date of Birth: 06-23-1934

## 2020-08-18 ENCOUNTER — Ambulatory Visit: Payer: Medicare HMO | Admitting: Occupational Therapy

## 2020-08-18 ENCOUNTER — Other Ambulatory Visit: Payer: Self-pay

## 2020-08-18 ENCOUNTER — Ambulatory Visit: Payer: Medicare HMO

## 2020-08-18 ENCOUNTER — Encounter: Payer: Self-pay | Admitting: Occupational Therapy

## 2020-08-18 DIAGNOSIS — R262 Difficulty in walking, not elsewhere classified: Secondary | ICD-10-CM

## 2020-08-18 DIAGNOSIS — M6281 Muscle weakness (generalized): Secondary | ICD-10-CM | POA: Diagnosis not present

## 2020-08-18 DIAGNOSIS — R278 Other lack of coordination: Secondary | ICD-10-CM

## 2020-08-18 DIAGNOSIS — R269 Unspecified abnormalities of gait and mobility: Secondary | ICD-10-CM

## 2020-08-18 NOTE — Therapy (Signed)
[place] MAIN [facility] SERVICES [address] [place], [place], [address] Phone: [phone]   Fax:  [phone]  Occupational Therapy Treatment  Patient Details  Name: Sherry [name] MRN: [id number] Date of Birth: [birth year] No data recorded  Encounter Date: 08/18/2020   OT End of Session - 08/18/20 1155     Visit Number 55    Number of Visits 38    Date for OT Re-Evaluation 10/22/20    Authorization Time Period Progress report period starting 07/30/2020    OT Start Time 1105    OT Stop Time 1145    OT Time Calculation (min) 40 min    Activity Tolerance Patient tolerated treatment well    Behavior During Therapy [facility] for tasks assessed/performed             Past Medical History:  Diagnosis Date   Acute blood loss anemia    Arthritis    Cancer ([place])    skin   Central cord syndrome at C6 level of cervical spinal cord ([place]) 11/29/2017   Hypertension    Protein-calorie malnutrition, severe ([place]) 01/24/2018   S/P insertion of IVC (inferior vena caval) filter 01/24/2018   Tetraparesis ([place])     Past Surgical History:  Procedure Laterality Date   ANTERIOR CERVICAL DECOMP/DISCECTOMY FUSION N/A 11/29/2017   Procedure: Cervical five-six, six-seven Anterior Cervical Decompression Fusion;  Surgeon: Judith Part, MD;  Location: [place];  Service: Neurosurgery;  Laterality: N/A;  Cervical five-six, six-seven Anterior Cervical Decompression Fusion   CATARACT EXTRACTION     IR IVC FILTER PLMT / S&I /IMG GUID/MOD SED  12/08/2017    There were no vitals filed for this visit.   Subjective Assessment - 08/18/20 1154     Subjective  Pt. reports bsing tired this week    Patient is accompanied by: Family member    Pertinent History Patient s/p fall November 29, 2017 resulting in diagnosis of central cord syndrome at C 6 level.  She has had therapy in multiple venues but with recent move to [facility], therapy staff recommended she seek  outpatient therapy for her needs.    Currently in Pain? No/denies             OT Treatment   Pt. tolerated bilateral shoulder flexion, extension, abduction, elbow flexion, extension forearm supination AAROM/AROM. PROM bilateral wrist extension, digit MP, PIP, and DIP flexion, and extension in conjunction with moist heat. Pt. worked on reaching using the [employer]. Pt. grasped and moved the shapes through 2 vertical dowels of varying heights with rest breaks.   Pt. continues to tolerate ROM, moist heat modality, and functional reaching without pain. Pt. was able  to move the shapes through 2 vertical dowels with both the right, and left UEs with increased time overall. Pt. required fewer cues for compensation proximally at the hips.  Pt. continues to progress with her RUE, and hand when performing self-feeding with adpative equipment. Pt. continues to be able to use her right thumb to hold items. Pt. tolerated ROM well without reports of pain, or discomfort. Pt. continues to benefit from working on improving ROM in order to work towards increasing engagement of her bilateral hands during ADLs, and IADL tasks.                            OT Education - 08/18/20 1155     Education Details BUE ROM with functional reaching  Person(s) Educated Patient;Caregiver(s)    Methods Explanation;Demonstration    Comprehension Verbalized understanding;Need further instruction                 OT Long Term Goals - 07/30/20 1139       OT LONG TERM GOAL #1   Title Patient and caregiver will demonstrate understanding of home exercise program for ROM.    Baseline Pt. continues to have a restorative aide rehab aide assist her wih exercises at [facility]    Time 12    Period Weeks    Status On-going    Target Date 10/22/20      OT LONG TERM GOAL #4   Title Patient will demonstrate improved composite finger flexion to hold deodorant to apply to underarms.    Baseline Pt.  continues to present with improving finger flexion of R hand for a partial gross grasp, but unable to grasp and hold items. pt. conitnues to present with limited digit flexion of L hand. Pt. has active left thumb abduction.    Time 12    Period Weeks    Status On-going    Target Date 10/22/20      Long Term Additional Goals   Additional Long Term Goals Yes      OT LONG TERM GOAL #6   Title Patient will increase right UE ROM to comb the back of her hair with modified independence.    Baseline 07/30/2020: Pt. is now able to comb the back of her hair with a pic, however continues to work on completing the task efficiently.    Time 12    Period Weeks    Status Partially Met    Target Date 10/22/20      OT LONG TERM GOAL #7   Title Pt. will independently, and efficiently send text messages on her phone.    Baseline 07/30/2020: Pt. continues to present with limited BUE strength, and [facility] skills.    Time 12    Period Weeks    Status New    Target Date 07/30/20      OT LONG TERM GOAL #8   Title Pt. will write, and sign her name with 100% legibility, and modified independence.    Baseline Pt. continues to  consistently filling out her daily menu, and writing for crossword puzzles. Pt. is able to maintain grasp on a wide width pen. [place] to work on Media planner.    Time 12    Period Weeks    Status On-going    Target Date 10/22/20                   Plan - 08/18/20 1156     Clinical Impression Statement Pt. continues to tolerate ROM, moist heat modality, and functional reaching without pain. Pt. was able  to move the shapes through 2 vertical dowels with both the right, and left UEs with increased time overall. Pt. required fewer cues for compensation proximally at the hips.  Pt. continues to progress with her RUE, and hand when performing self-feeding with adpative equipment. Pt. continues to be able to use her right thumb to hold items. Pt. tolerated ROM well without  reports of pain, or discomfort. Pt. continues to benefit from working on improving ROM in order to work towards increasing engagement of her bilateral hands during ADLs, and IADL tasks.        OT Occupational Profile and History Detailed Assessment- Review of Records and additional review of physical,  cognitive, psychosocial history related to current functional performance    Occupational performance deficits (Please refer to evaluation for details): ADL's;IADL's;Leisure;Social Participation    Body Structure / Function / Physical Skills ADL;Continence;Dexterity;Flexibility;Strength;ROM;Balance;Coordination;FMC;IADL;Endurance;UE functional use;Decreased knowledge of use of DME;GMC    Psychosocial Skills Environmental  Adaptations;Habits;Routines and Behaviors    Rehab Potential Fair    Clinical Decision Making Several treatment options, min-mod task modification necessary    Comorbidities Affecting Occupational Performance: Presence of comorbidities impacting occupational performance    Comorbidities impacting occupational performance description: contractures of bilateral hands, dependent transfers,    Modification or Assistance to Complete Evaluation  No modification of tasks or assist necessary to complete eval    OT Frequency 2x / week    OT Duration 12 weeks    OT Treatment/Interventions Self-care/ADL training;Cryotherapy;Therapeutic exercise;DME and/or AE instruction;Balance training;Neuromuscular education;Manual Therapy;Splinting;Moist Heat;Passive range of motion;Therapeutic activities;Patient/family education    Consulted and Agree with Plan of Care Patient             Patient will benefit from skilled therapeutic intervention in order to improve the following deficits and impairments:   Body Structure / Function / Physical Skills: ADL, Continence, Dexterity, Flexibility, Strength, ROM, Balance, Coordination, FMC, IADL, Endurance, UE functional use, Decreased knowledge of use of  DME, GMC   Psychosocial Skills: Environmental  Adaptations, Habits, Routines and Behaviors   Visit Diagnosis: Muscle weakness (generalized)  Other lack of coordination    Problem List Patient Active Problem List   Diagnosis Date Noted   Acute appendicitis with appendiceal abscess 05/30/2018   Hypocalcemia 02/15/2018   Dysuria 02/15/2018   Bilateral lower extremity edema 02/11/2018   Vaginal yeast infection 01/30/2018   Chest tightness 01/27/2018   Healthcare-associated pneumonia 01/25/2018   Pleural effusion, not elsewhere classified 01/25/2018   Acute deep vein thrombosis (DVT) of left lower extremity ([place]) 01/24/2018   Acute deep vein thrombosis (DVT) of right lower extremity ([place]) 01/24/2018   Chronic allergic rhinitis 01/24/2018   Depression with anxiety 01/24/2018   UTI due to Klebsiella species 01/24/2018   Protein-calorie malnutrition, severe ([place]) 01/24/2018   S/P insertion of IVC (inferior vena caval) filter 01/24/2018   Reactive depression    Hypokalemia    Leukocytosis    Essential hypertension    Trauma    Tetraparesis (HCC)    Neuropathic pain    Neurogenic bowel    Neurogenic bladder    Benign essential HTN    Acute blood loss anemia    Central cord syndrome at C6 level of cervical spinal cord ([place]) 11/29/2017   Central cord syndrome ([place]) 11/29/2017    Harrel Carina, MS, OTR/L 08/18/2020, 11:58 AM  [place] [address] [place], [place], [address] Phone: [phone]   Fax:  [phone]  Name: GENEIEVE DUELL MRN: [id number] Date of Birth: 11/23/1934

## 2020-08-18 NOTE — Therapy (Signed)
[place] MAIN [facility] SERVICES [address] [place], [place], [address] Phone: [phone]   Fax:  [phone]  Physical Therapy Treatment  Patient Details  Name: Sherry [name] MRN: [id number] Date of Birth: 12/19/1934 Referring Provider (PT): Viviana Simpler   Encounter Date: 08/18/2020   PT End of Session - 08/18/20 1218     Visit Number [id number]    Number of Visits 104    Date for PT Re-Evaluation 09/24/20    Authorization Type [employer]na medicare FOTO performed by PT on eval (7/20), score 12, Progress note on 03/10/2020    Authorization Time Period Recert 0/10/3816- 2/99/3716; PN on 07/07/2020    PT Start Time 1147    PT Stop Time 1227    PT Time Calculation (min) 40 min    Equipment Utilized During Treatment Gait belt    Activity Tolerance Patient tolerated treatment well;Patient limited by fatigue    Behavior During Therapy WFL for tasks assessed/performed             Past Medical History:  Diagnosis Date   Acute blood loss anemia    Arthritis    Cancer ([place])    skin   Central cord syndrome at C6 level of cervical spinal cord ([place]) 11/29/2017   Hypertension    Protein-calorie malnutrition, severe ([place]) 01/24/2018   S/P insertion of IVC (inferior vena caval) filter 01/24/2018   Tetraparesis ([place])     Past Surgical History:  Procedure Laterality Date   ANTERIOR CERVICAL DECOMP/DISCECTOMY FUSION N/A 11/29/2017   Procedure: Cervical five-six, six-seven Anterior Cervical Decompression Fusion;  Surgeon: Judith Part, MD;  Location: [place];  Service: Neurosurgery;  Laterality: N/A;  Cervical five-six, six-seven Anterior Cervical Decompression Fusion   CATARACT EXTRACTION     IR IVC FILTER PLMT / S&I /IMG GUID/MOD SED  12/08/2017    There were no vitals filed for this visit.   Subjective Assessment - 08/18/20 1158     Subjective Patient reports the straps were replaced and good to go as far as standing today.    Pertinent  History Pt is an 86 yo female that fell in 2019, fractured vertebrae in her neck and in her low back per family. 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 After hospital stay, pt discharged to [facility] for ~1 month, experienced severe UTI as well as bilateral DVT (IVC filter placed). Discharged to [facility], stayed for about a year, and then moved to [facility] in April 2021. Was receiving PT, but per family facility reported that did not have adequate equipment to maximize PT for her. Pt until about 1 month ago was practicing sit to stand transfers with 1-2 people, and working on static standing. Has a brace for L foot due to PF. Pt currently needs assistance with all ADLs (able to assist with donning/doffing her shirt), bed baths, and needs a hoyer lift for transfers. Able to drive power wheelchair without assistance.    Limitations Standing;Walking;House hold activities;Lifting    How long can you sit comfortably? NA    How long can you stand comfortably? unable    How long can you walk comfortably? unable    Patient Stated Goals "to stand up and walk"    Currently in Pain? No/denies              Therapeutic activity: (Performed in // bars today)   Patient performed sit to stand x 4 trials today - initially with max a the  1st trial with B knee buckling requiring max assist to recover and sit. Patient then performed 3 more sit to stand transfers- progressing from Max A with 1st two trials then down to [place] assist on 3rd atempt. On last attempt- patient stood with CGA (requiring increased time and very heavy CGA from seat of wheelchair) .   Static standing: 1) 30 sec (stopped due to knee buckling)  2) 3 min 45 sec 3) 2 min 55 sec 4) 3 min 13 sec- Focusing on posture and alignment. Patient with tendency to hyperextend her left knee and difficulty with maintaining hip extension.   Therapeutic exercises:    AAROM B hip march x 10 reps x 2 sets (going through full ROM) Isometric  hip abd BLE with manual resistance and 3 sec hold  10 reps x 2 sets; Isometric hip adduction with manual resistance and 3 sec hold 2 x 10 reps; Leg press from power w/c in tilted position 2 x 15 with manual resistance; Seated knee extension 2 x 10 reps BLE (AROM on left and AAROM on Right LE)  Education provided throughout session via VC/TC and demonstration to facilitate movement at target joints and correct muscle activation for all testing and exercises performed.   Clinical Impression: Patient achieved much improved sit to stand with less physical assistance today. She required more time to accomplish yet much improved overall and abel to achieve Sit to stand transfer with CGA  on her last attempt which was her best individual performance to date. Patient will benefit from continued PT services to increase mobility and strength to improve patient's quality of life                      PT Education - 08/18/20 1218     Education Details exercise technique, dynamic weight shifting    Person(s) Educated Patient    Methods Explanation;Demonstration;Tactile cues;Verbal cues    Comprehension Verbalized understanding;Returned demonstration;Verbal cues required;Tactile cues required;Need further instruction              PT Short Term Goals - 03/10/20 2211       PT SHORT TERM GOAL #1   Title The patient will perform initial HEP with minimum assistance in order to improve strength and function.    Baseline Patient demonstrating independence with initial HEP as of 03/10/2020 with no questions or difficulty.    Time 4    Period Weeks    Status Achieved    Target Date 02/11/20               PT Long Term Goals - 07/02/20 1734       PT LONG TERM GOAL #1   Title The patient will be compliant with finalized HEP with minimum assistance in preparation for self management and maintenance of condition.    Baseline 03/10/2020- Patient familiar with initial HEP and will keep  goal active as HEP becomes more progressive including possible transfers and standing activities. 04/07/2020- Patient continues to report compliance with LE strengthening home exercises without questions or concerns. 04/21/2020- Patient able to verbalize and demonstrate good understanding of current HEP including some supine and seated LE strengthening exercises. Reviewed and patient performed 10 reps today with minimal cueing. Will keep goal active to progress HEP as appropriate. 05/28/2020- patient reports continues to perform LE strengthening as able and some help from caregiver as able. No changes at this time to home program as patient is limited to supine/seated therex. 07/02/2020-  Patient continues to report compliance with current supine and seated LE strengtheing home program and states no questions or concerns regarding her plan.    Time 12    Period Weeks    Status Achieved      PT LONG TERM GOAL #2   Title The patient will demonstrate at least 10 point improvement on FOTO score indicating an improved ability to perform functional activities.    Baseline on eval (/720) score 12; 10/22/19: 12; 12/10/19: 18, 12/13: 18: 04/07/2020- Will assess next visit. 07/02/2020- Will obtain next visit.    Time 12    Period Weeks    Status On-going    Target Date 09/24/20      PT LONG TERM GOAL #3   Title The patient will demonstrate lateral scooting  for assistance with transfers with minA to maximize independence.    Baseline 10/22/19: Pt requires modA+1 for lateral scooting; 12/10/19: modA+1, 12/13: modA-maxA, 12/27: modA-maxA, 03/10/2020- ModA-MaxA- increased verbal cues and visual demonstration. 04/07/2020- Continued Max assist to perform forward/lateral scoot. 04/21/2020- Patient was able to demo slight lateral scoot with CGA approx 12 in then fatigued requiring max assist- she is able to scoot forward by leaning without significant issues. 05/28/2020- While sitting in chair- patient able to perform forward and  lateral scoot with VC/visual demo and increased time allowed- CGA today but not consistent yet- will keep goal active at this time to continue to focus on strengthening and improving technique. 07/02/2020- Patient able to demonstrate minimal ability to laterally shift weigh on mat table requiring max assist yet is able to forward scoot out to edge of mat table with min Assist.    Time 12    Period Weeks    Status On-going    Target Date 09/24/20      PT LONG TERM GOAL #4   Title The patient will demonstrate a squat pivot transfer with minimum assistance to maximize independence and mobility. 07/02/2020- Patient is able to perform a stand pivot transfer with max assist- was able to minimally pivot on left LE today with transfer.    Baseline 10/22/19: unable to safely attempt at this time; 12/10/19: unable to safely attempt at this time, 01/14/20: unable to safety attempt at this time. 03/10/2020- Patient able to perform Stand pivot transfer with Maximal assist. 04/07/2020- Patient continues to require Max assist with sit to stand- will assist SPT next visit. 04/21/2020- Patient presents with Max assist to perform Sit to stand Transfer- unable to pivot or move Left LE well without difficulty.    Time 12    Period Weeks    Status On-going    Target Date 09/24/20      PT LONG TERM GOAL #5   Title The patient will demonstrate sitting without UE support for 2-5 minutes at EOB to improve participation and maximize independence with ADLs.    Baseline 10/22/19: Pt able to sit unsupported for at least 2 minutes at edge of bed. 04/21/2020- Patient continues to demo good sitting at edge of mat > 5 miin without difficulty or back support.    Time 12    Period Weeks    Status Achieved      PT LONG TERM GOAL #6   Title Patient will demonstrate improved functional LE strength as seen by consistent ability to stand > 2 min (3 out of 4 trials) with Mod/Max A for improved LE strength with transfers.    Baseline 05/28/2020-  Patient able to inconsistent stand 1-2 min  right now in //bars with Mod/Max A. 07/02/2020- Patient was able to progress last 2 visit to standing using bilateral platform attachment between 1-2 min which is a progression from the parallel bars.    Time 12    Period Weeks    Status On-going    Target Date 09/24/20                   Plan - 08/18/20 1219     Clinical Impression Statement Patient achieved much improved sit to stand with less physical assistance today. She required more time to accomplish yet much improved overall and abel to achieve Sit to stand transfer with CGA  on her last attempt which was her best individual performance to date. Patient will benefit from continued PT services to increase mobility and strength to improve patient's quality of life  ?  ?    Personal Factors and Comorbidities Age;Time since onset of injury/illness/exacerbation;Comorbidity 3+;Fitness    Comorbidities HTN, quadriparesis, history of DVT, neurogenic bladder    Examination-Activity Limitations Bathing;Hygiene/Grooming;Squat;Bed Mobility;Lift;Bend;Stand;Engineer, manufacturing;Toileting;Self Feeding;Transfers;Continence;Sit;Dressing;Sleep;Carry    Examination-Participation Restrictions Church;Laundry;Cleaning;Medication Management;Community Activity;Meal Prep;Interpersonal Relationship    Stability/Clinical Decision Making Evolving/Moderate complexity    Rehab Potential Fair    PT Frequency 2x / week    PT Duration 12 weeks    PT Treatment/Interventions ADLs/Self Care Home Management;Electrical Stimulation;Therapeutic activities;Wheelchair mobility training;Vasopneumatic Device;Joint Manipulations;Vestibular;Passive range of motion;Patient/family education;Therapeutic exercise;DME Instruction;Biofeedback;Aquatic Therapy;Moist Heat;Gait training;Balance training;Orthotic Fit/Training;Dry needling;Energy conservation;Taping;Splinting;Neuromuscular re-education;Cryotherapy;Ultrasound;Functional  mobility training    PT Next Visit Plan Continue with progressive Standing/transfer training.    PT Home Exercise Plan no changes    Consulted and Agree with Plan of Care Patient             Patient will benefit from skilled therapeutic intervention in order to improve the following deficits and impairments:  Abnormal gait, Decreased balance, Decreased endurance, Decreased mobility, Difficulty walking, Hypomobility, Impaired sensation, Decreased range of motion, Improper body mechanics, Impaired perceived functional ability, Decreased activity tolerance, Decreased knowledge of use of DME, Decreased safety awareness, Decreased strength, Impaired flexibility, Impaired UE functional use, Postural dysfunction  Visit Diagnosis: Abnormality of gait and mobility  Difficulty in walking, not elsewhere classified  Muscle weakness (generalized)  Other lack of coordination     Problem List Patient Active Problem List   Diagnosis Date Noted   Acute appendicitis with appendiceal abscess 05/30/2018   Hypocalcemia 02/15/2018   Dysuria 02/15/2018   Bilateral lower extremity edema 02/11/2018   Vaginal yeast infection 01/30/2018   Chest tightness 01/27/2018   Healthcare-associated pneumonia 01/25/2018   Pleural effusion, not elsewhere classified 01/25/2018   Acute deep vein thrombosis (DVT) of left lower extremity ([place]) 01/24/2018   Acute deep vein thrombosis (DVT) of right lower extremity ([place]) 01/24/2018   Chronic allergic rhinitis 01/24/2018   Depression with anxiety 01/24/2018   UTI due to Klebsiella species 01/24/2018   Protein-calorie malnutrition, severe ([place]) 01/24/2018   S/P insertion of IVC (inferior vena caval) filter 01/24/2018   Reactive depression    Hypokalemia    Leukocytosis    Essential hypertension    Trauma    Tetraparesis ([facility])    Neuropathic pain    Neurogenic bowel    Neurogenic bladder    Benign essential HTN    Acute blood loss anemia    Central cord  syndrome at C6 level of cervical spinal cord ([place]) 11/29/2017   Central cord syndrome ([place]) 11/29/2017    Lewis Moccasin, PT 08/18/2020, 2:19 PM  [place] MAIN [facility] SERVICES [address] [place], [place], [address] Phone: [phone]   Fax:  [phone]  Name: EISLEY BARBER MRN: [id number] Date of Birth: 03-14-1934

## 2020-08-20 ENCOUNTER — Ambulatory Visit: Payer: Medicare HMO

## 2020-08-20 ENCOUNTER — Ambulatory Visit: Payer: Medicare HMO | Admitting: Occupational Therapy

## 2020-08-20 ENCOUNTER — Encounter: Payer: Self-pay | Admitting: Occupational Therapy

## 2020-08-20 ENCOUNTER — Other Ambulatory Visit: Payer: Self-pay

## 2020-08-20 DIAGNOSIS — M6281 Muscle weakness (generalized): Secondary | ICD-10-CM

## 2020-08-20 DIAGNOSIS — R262 Difficulty in walking, not elsewhere classified: Secondary | ICD-10-CM

## 2020-08-20 NOTE — Therapy (Signed)
[place] MAIN [facility] SERVICES [address] [place], [place], [address] Phone: [phone]   Fax:  [phone]  Occupational Therapy Treatment  Patient Details  Name: Sherry [name] MRN: [id number] Date of Birth: [birth year] No data recorded  Encounter Date: 08/20/2020   OT End of Session - 08/20/20 1311     Visit Number 65    Number of Visits 14    Date for OT Re-Evaluation 10/22/20    Authorization Time Period Progress report period starting 07/30/2020    OT Start Time 5    OT Stop Time 1145    OT Time Calculation (min) 45 min    Equipment Utilized During Treatment moist heat    Activity Tolerance Patient tolerated treatment well    Behavior During Therapy WFL for tasks assessed/performed             Past Medical History:  Diagnosis Date   Acute blood loss anemia    Arthritis    Cancer ([place])    skin   Central cord syndrome at C6 level of cervical spinal cord ([place]) 11/29/2017   Hypertension    Protein-calorie malnutrition, severe ([place]) 01/24/2018   S/P insertion of IVC (inferior vena caval) filter 01/24/2018   Tetraparesis ([place])     Past Surgical History:  Procedure Laterality Date   ANTERIOR CERVICAL DECOMP/DISCECTOMY FUSION N/A 11/29/2017   Procedure: Cervical five-six, six-seven Anterior Cervical Decompression Fusion;  Surgeon: Judith Part, MD;  Location: [place];  Service: Neurosurgery;  Laterality: N/A;  Cervical five-six, six-seven Anterior Cervical Decompression Fusion   CATARACT EXTRACTION     IR IVC FILTER PLMT / S&I /IMG GUID/MOD SED  12/08/2017    There were no vitals filed for this visit.   Subjective Assessment - 08/20/20 1308     Subjective  Pt. reports that the state surveyors are at the facility where she resides this week.    Patient is accompanied by: Family member    Pertinent History Patient s/p fall November 29, 2017 resulting in diagnosis of central cord syndrome at C 6 level.  She has had  therapy in multiple venues but with recent move to [facility], therapy staff recommended she seek outpatient therapy for her needs.    Patient Stated Goals Patient would like to be able to move better in bed, stand and perform self care tasks.    Currently in Pain? No/denies            OT Treatment   Pt. tolerated bilateral shoulder flexion, extension, abduction, elbow flexion, extension forearm supination AAROM/AROM. PROM bilateral wrist extension, digit MP, PIP, and DIP flexion, and extension in conjunction with moist heat. Pt. worked on reaching using the [employer]. Pt. grasped and moved the shapes through 2 vertical dowels of varying heights. Pt. was able to perform the task more freely today.   Pt. continues to tolerate ROM, moist heat modality, and functional reaching without pain. Pt. was able  to move the shapes through 2 vertical dowels with both the right, and left UEs with increased time. Pt. required fewer cues for compensation proximally at the hips.  Pt. continues to progress with her RUE, and hand when performing self-feeding with adpative equipment. Pt. continues to be able to use her right thumb to hold items. Pt. tolerated ROM well without reports of pain, or discomfort. Pt. continues to benefit from working on improving ROM in order to work towards increasing engagement of her bilateral hands during ADLs,  and IADL tasks.                         OT Education - 08/20/20 1310     Education Details BUE ROM with functional reaching    Person(s) Educated Patient;Caregiver(s)    Methods Explanation;Demonstration    Comprehension Verbalized understanding;Need further instruction                 OT Long Term Goals - 07/30/20 1139       OT LONG TERM GOAL #1   Title Patient and caregiver will demonstrate understanding of home exercise program for ROM.    Baseline Pt. continues to have a restorative aide rehab aide assist her wih exercises at [facility]    Time 12    Period Weeks    Status On-going    Target Date 10/22/20      OT LONG TERM GOAL #4   Title Patient will demonstrate improved composite finger flexion to hold deodorant to apply to underarms.    Baseline Pt. continues to present with improving finger flexion of R hand for a partial gross grasp, but unable to grasp and hold items. pt. conitnues to present with limited digit flexion of L hand. Pt. has active left thumb abduction.    Time 12    Period Weeks    Status On-going    Target Date 10/22/20      Long Term Additional Goals   Additional Long Term Goals Yes      OT LONG TERM GOAL #6   Title Patient will increase right UE ROM to comb the back of her hair with modified independence.    Baseline 07/30/2020: Pt. is now able to comb the back of her hair with a pic, however continues to work on completing the task efficiently.    Time 12    Period Weeks    Status Partially Met    Target Date 10/22/20      OT LONG TERM GOAL #7   Title Pt. will independently, and efficiently send text messages on her phone.    Baseline 07/30/2020: Pt. continues to present with limited BUE strength, and [facility] skills.    Time 12    Period Weeks    Status New    Target Date 07/30/20      OT LONG TERM GOAL #8   Title Pt. will write, and sign her name with 100% legibility, and modified independence.    Baseline Pt. continues to  consistently filling out her daily menu, and writing for crossword puzzles. Pt. is able to maintain grasp on a wide width pen. [place] to work on Media planner.    Time 12    Period Weeks    Status On-going    Target Date 10/22/20                   Plan - 08/20/20 1311     Clinical Impression Statement Pt. continues to tolerate ROM, moist heat modality, and functional reaching without pain. Pt. was able  to move the shapes through 2 vertical dowels with both the right, and left UEs with increased time. Pt. required fewer cues for compensation  proximally at the hips.  Pt. continues to progress with her RUE, and hand when performing self-feeding with adpative equipment. Pt. continues to be able to use her right thumb to hold items. Pt. tolerated ROM well without reports of pain, or discomfort. Pt. continues  to benefit from working on improving ROM in order to work towards increasing engagement of her bilateral hands during ADLs, and IADL tasks.      OT Occupational Profile and History Detailed Assessment- Review of Records and additional review of physical, cognitive, psychosocial history related to current functional performance    Occupational performance deficits (Please refer to evaluation for details): ADL's;IADL's;Leisure;Social Participation    Body Structure / Function / Physical Skills ADL;Continence;Dexterity;Flexibility;Strength;ROM;Balance;Coordination;FMC;IADL;Endurance;UE functional use;Decreased knowledge of use of DME;GMC    Psychosocial Skills Environmental  Adaptations;Habits;Routines and Behaviors    Rehab Potential Fair    Clinical Decision Making Several treatment options, min-mod task modification necessary    Comorbidities Affecting Occupational Performance: Presence of comorbidities impacting occupational performance    Comorbidities impacting occupational performance description: contractures of bilateral hands, dependent transfers,    Modification or Assistance to Complete Evaluation  No modification of tasks or assist necessary to complete eval    OT Frequency 2x / week    OT Duration 12 weeks    OT Treatment/Interventions Self-care/ADL training;Cryotherapy;Therapeutic exercise;DME and/or AE instruction;Balance training;Neuromuscular education;Manual Therapy;Splinting;Moist Heat;Passive range of motion;Therapeutic activities;Patient/family education    Consulted and Agree with Plan of Care Patient             Patient will benefit from skilled therapeutic intervention in order to improve the following  deficits and impairments:   Body Structure / Function / Physical Skills: ADL, Continence, Dexterity, Flexibility, Strength, ROM, Balance, Coordination, FMC, IADL, Endurance, UE functional use, Decreased knowledge of use of DME, GMC   Psychosocial Skills: Environmental  Adaptations, Habits, Routines and Behaviors   Visit Diagnosis: Muscle weakness (generalized)    Problem List Patient Active Problem List   Diagnosis Date Noted   Acute appendicitis with appendiceal abscess 05/30/2018   Hypocalcemia 02/15/2018   Dysuria 02/15/2018   Bilateral lower extremity edema 02/11/2018   Vaginal yeast infection 01/30/2018   Chest tightness 01/27/2018   Healthcare-associated pneumonia 01/25/2018   Pleural effusion, not elsewhere classified 01/25/2018   Acute deep vein thrombosis (DVT) of left lower extremity ([place]) 01/24/2018   Acute deep vein thrombosis (DVT) of right lower extremity ([place]) 01/24/2018   Chronic allergic rhinitis 01/24/2018   Depression with anxiety 01/24/2018   UTI due to Klebsiella species 01/24/2018   Protein-calorie malnutrition, severe ([place]) 01/24/2018   S/P insertion of IVC (inferior vena caval) filter 01/24/2018   Reactive depression    Hypokalemia    Leukocytosis    Essential hypertension    Trauma    Tetraparesis ([facility])    Neuropathic pain    Neurogenic bowel    Neurogenic bladder    Benign essential HTN    Acute blood loss anemia    Central cord syndrome at C6 level of cervical spinal cord ([place]) 11/29/2017   Central cord syndrome ([place]) 11/29/2017    Harrel Carina, MS, OTR/L 08/20/2020, 1:15 PM  [place] MAIN [facility] SERVICES [address] [place], [place], [address] Phone: [phone]   Fax:  [phone]  Name: Sherry [name] MRN: [id number] Date of Birth: 02/08/1934

## 2020-08-20 NOTE — Therapy (Signed)
[place] MAIN [facility] SERVICES [address] [place], [place], [address] Phone: [phone]   Fax:  [phone]  Physical Therapy Treatment/Physical Therapy Progress Note   Dates of reporting period  07/07/2020   to  08/20/2020  Patient Details  Name: Sherry [name] MRN: [id number] Date of Birth: [birth year] No data recorded   Encounter Date: 08/20/2020   PT End of Session - 08/20/20 0659     Visit Number 80    Number of Visits 104    Date for PT Re-Evaluation 09/24/20    Authorization Type [employer] FOTO performed by PT on eval (7/20), score 12, Progress note on 03/10/2020; PN on 08/20/2020    Authorization Time Period Recert 05/09/5460- 08/04/5007; PN on 07/07/2020    PT Start Time 1146    PT Stop Time 1219    PT Time Calculation (min) 33 min    Equipment Utilized During Treatment Gait belt    Activity Tolerance Patient tolerated treatment well;Patient limited by fatigue    Behavior During Therapy WFL for tasks assessed/performed             Past Medical History:  Diagnosis Date   Acute blood loss anemia    Arthritis    Cancer ([place])    skin   Central cord syndrome at C6 level of cervical spinal cord ([place]) 11/29/2017   Hypertension    Protein-calorie malnutrition, severe ([place]) 01/24/2018   S/P insertion of IVC (inferior vena caval) filter 01/24/2018   Tetraparesis ([place])     Past Surgical History:  Procedure Laterality Date   ANTERIOR CERVICAL DECOMP/DISCECTOMY FUSION N/A 11/29/2017   Procedure: Cervical five-six, six-seven Anterior Cervical Decompression Fusion;  Surgeon: Judith Part, MD;  Location: [facility];  Service: Neurosurgery;  Laterality: N/A;  Cervical five-six, six-seven Anterior Cervical Decompression Fusion   CATARACT EXTRACTION     IR IVC FILTER PLMT / S&I /IMG GUID/MOD SED  12/08/2017    There were no vitals filed for this visit.   Subjective Assessment - 08/20/20 1148     Subjective Patient reports  doing well today with no complaints. States very pleased with her effort/performance last visit.    Pertinent History Pt is an 86 yo female that fell in 2019, fractured vertebrae in her neck and in her low back per family. 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 After hospital stay, pt discharged to [facility] for ~1 month, experienced severe UTI as well as bilateral DVT (IVC filter placed). Discharged to [facility], stayed for about a year, and then moved to [facility] in April 2021. Was receiving PT, but per family facility reported that did not have adequate equipment to maximize PT for her. Pt until about 1 month ago was practicing sit to stand transfers with 1-2 people, and working on static standing. Has a brace for L foot due to PF. Pt currently needs assistance with all ADLs (able to assist with donning/doffing her shirt), bed baths, and needs a hoyer lift for transfers. Able to drive power wheelchair without assistance.    Limitations Standing;Walking;House hold activities;Lifting    How long can you sit comfortably? NA    How long can you stand comfortably? unable    How long can you walk comfortably? unable    Patient Stated Goals "to stand up and walk"    Currently in Pain? No/denies  Assessed Long term goals for progress report today:    Will assess FOTO next visit secondary to unable to complete today- Patient experienced incontinent bowel.  Patient was able to demonstrate minimal lateral scooting at edge of mat while holding feet still so they did not slip on tile surface. She continues to be able to demonstrate scoot forward and uses tilt option to recline to position safely back into seat of power wheelchair.  Patient able to stand with minimal assist this visit (as good as CGA last visit) and able to move right foot to pivot and performing with moderate assist today.   Patient performed static standing with min- mod A using  bilateral platform attachment on walker for 6 min 30 sec today with assist for trunk control and intermittent verbal cues for posture.  Clinical Impression: Patient continues to demonstrate good progress toward goals. She demo ability to lateral scoot with less physical assistance today - requiring only someone to block her feet. She also experienced her best standing performance to date- able to stand for 6 min 30 sec. She is also demo improved transfer ability with less physical assist from max assist down to [place]. Patient's condition has the potential to improve in response to therapy. Maximum improvement is yet to be obtained. The anticipated improvement is attainable and reasonable in a generally predictable time.       PT Education - 08/21/20 0659     Education Details transfer technique    Person(s) Educated Patient    Methods Explanation;Demonstration;Tactile cues;Verbal cues    Comprehension Verbalized understanding;Returned demonstration;Verbal cues required;Tactile cues required;Need further instruction              PT Short Term Goals - 03/10/20 2211       PT SHORT TERM GOAL #1   Title The patient will perform initial HEP with minimum assistance in order to improve strength and function.    Baseline Patient demonstrating independence with initial HEP as of 03/10/2020 with no questions or difficulty.    Time 4    Period Weeks    Status Achieved    Target Date 02/11/20               PT Long Term Goals - 08/20/20 0706       PT LONG TERM GOAL #1   Title The patient will be compliant with finalized HEP with minimum assistance in preparation for self management and maintenance of condition.    Baseline 03/10/2020- Patient familiar with initial HEP and will keep goal active as HEP becomes more progressive including possible transfers and standing activities. 04/07/2020- Patient continues to report compliance with LE strengthening home exercises without  questions or concerns. 04/21/2020- Patient able to verbalize and demonstrate good understanding of current HEP including some supine and seated LE strengthening exercises. Reviewed and patient performed 10 reps today with minimal cueing. Will keep goal active to progress HEP as appropriate. 05/28/2020- patient reports continues to perform LE strengthening as able and some help from caregiver as able. No changes at this time to home program as patient is limited to supine/seated therex. 07/02/2020- Patient continues to report compliance with current supine and seated LE strengtheing home program and states no questions or concerns regarding her plan.    Time 12    Period Weeks    Status Achieved      PT LONG TERM GOAL #2   Title The patient will demonstrate at least 10 point improvement on  FOTO score indicating an improved ability to perform functional activities.    Baseline on eval (/720) score 12; 10/22/19: 12; 12/10/19: 18, 12/13: 18: 04/07/2020- Will assess next visit. 07/02/2020- Will obtain next visit.    Time 12    Period Weeks    Status On-going    Target Date 09/24/20      PT LONG TERM GOAL #3   Title The patient will demonstrate lateral scooting  for assistance with transfers with minA to maximize independence.    Baseline 10/22/19: Pt requires modA+1 for lateral scooting; 12/10/19: modA+1, 12/13: modA-maxA, 12/27: modA-maxA, 03/10/2020- ModA-MaxA- increased verbal cues and visual demonstration. 04/07/2020- Continued Max assist to perform forward/lateral scoot. 04/21/2020- Patient was able to demo slight lateral scoot with CGA approx 12 in then fatigued requiring max assist- she is able to scoot forward by leaning without significant issues. 05/28/2020- While sitting in chair- patient able to perform forward and lateral scoot with VC/visual demo and increased time allowed- CGA today but not consistent yet- will keep goal active at this time to continue to focus on strengthening and improving technique.  07/02/2020- Patient able to demonstrate minimal ability to laterally shift weigh on mat table requiring max assist yet is able to forward scoot out to edge of mat table with min Assist. 08/20/2020 - Patient was able to demonstrate minimal lateral scooting at edge of mat while holding feet still so they did not slip on tile surface. She continues to be able to demonstrate scoot forward and uses tilt option to recline to position safely back into seat of power wheelchair.    Time 12    Period Weeks    Status On-going    Target Date 09/24/20      PT LONG TERM GOAL #4   Title The patient will demonstrate a squat pivot transfer with minimum assistance to maximize independence and mobility.    Baseline 10/22/19: unable to safely attempt at this time; 12/10/19: unable to safely attempt at this time, 01/14/20: unable to safety attempt at this time. 03/10/2020- Patient able to perform Stand pivot transfer with Maximal assist. 04/07/2020- Patient continues to require Max assist with sit to stand- will assist SPT next visit. 04/21/2020- Patient presents with Max assist to perform Sit to stand Transfer- unable to pivot or move Left LE well without difficulty. 07/02/2020= Max assist with SPT and minimal ability to pivot. 7/202/2022-Patient able to stand with minimal assist this visit (as good as CGA last visit) and able to move right foot to pivot and performing with moderate assist today.  ?    Time 12    Period Weeks    Status On-going    Target Date 09/24/20      PT LONG TERM GOAL #5   Title The patient will demonstrate sitting without UE support for 2-5 minutes at EOB to improve participation and maximize independence with ADLs.    Baseline 10/22/19: Pt able to sit unsupported for at least 2 minutes at edge of bed. 04/21/2020- Patient continues to demo good sitting at edge of mat > 5 miin without difficulty or back support.    Time 12    Period Weeks    Status Achieved      PT LONG TERM GOAL #6   Title Patient will  demonstrate improved functional LE strength as seen by consistent ability to stand > 2 min (3 out of 4 trials) with Mod/Max A for improved LE strength with transfers.    Baseline 05/28/2020- Patient  able to inconsistent stand 1-2 min right now in //bars with Mod/Max A. 07/02/2020- Patient was able to progress last 2 visit to standing using bilateral platform attachment between 1-2 min which is a progression from the parallel bars. 08/20/2020- Patient performed static standing with min- mod A using bilateral platform attachment on walker for 6 min 30 sec today with assist for trunk control and intermittent verbal cues for posture.    Time 12    Period Weeks    Status On-going    Target Date 09/24/20                   Plan - 08/20/20 0700     Clinical Impression Statement Patient continues to demonstrate good progress toward goals. She demo ability to lateral scoot with less physical assistance today - requiring only someone to block her feet. She also experienced her best standing performance to date- able to stand for 6 min 30 sec. She is also demo improved transfer ability with less physical assist from max assist down to [place]. Patient's condition has the potential to improve in response to therapy. Maximum improvement is yet to be obtained. The anticipated improvement is attainable and reasonable in a generally predictable time.    Personal Factors and Comorbidities Age;Time since onset of injury/illness/exacerbation;Comorbidity 3+;Fitness    Comorbidities HTN, quadriparesis, history of DVT, neurogenic bladder    Examination-Activity Limitations Bathing;Hygiene/Grooming;Squat;Bed Mobility;Lift;Bend;Stand;Engineer, manufacturing;Toileting;Self Feeding;Transfers;Continence;Sit;Dressing;Sleep;Carry    Examination-Participation Restrictions Church;Laundry;Cleaning;Medication Management;Community Activity;Meal Prep;Interpersonal Relationship    Stability/Clinical Decision Making  Evolving/Moderate complexity    Rehab Potential Fair    PT Frequency 2x / week    PT Duration 12 weeks    PT Treatment/Interventions ADLs/Self Care Home Management;Electrical Stimulation;Therapeutic activities;Wheelchair mobility training;Vasopneumatic Device;Joint Manipulations;Vestibular;Passive range of motion;Patient/family education;Therapeutic exercise;DME Instruction;Biofeedback;Aquatic Therapy;Moist Heat;Gait training;Balance training;Orthotic Fit/Training;Dry needling;Energy conservation;Taping;Splinting;Neuromuscular re-education;Cryotherapy;Ultrasound;Functional mobility training    PT Next Visit Plan Continue with progressive Standing/transfer training.    PT Home Exercise Plan no changes    Consulted and Agree with Plan of Care Patient             Patient will benefit from skilled therapeutic intervention in order to improve the following deficits and impairments:  Abnormal gait, Decreased balance, Decreased endurance, Decreased mobility, Difficulty walking, Hypomobility, Impaired sensation, Decreased range of motion, Improper body mechanics, Impaired perceived functional ability, Decreased activity tolerance, Decreased knowledge of use of DME, Decreased safety awareness, Decreased strength, Impaired flexibility, Impaired UE functional use, Postural dysfunction  Visit Diagnosis: Difficulty in walking, not elsewhere classified  Muscle weakness (generalized)     Problem List Patient Active Problem List   Diagnosis Date Noted   Acute appendicitis with appendiceal abscess 05/30/2018   Hypocalcemia 02/15/2018   Dysuria 02/15/2018   Bilateral lower extremity edema 02/11/2018   Vaginal yeast infection 01/30/2018   Chest tightness 01/27/2018   Healthcare-associated pneumonia 01/25/2018   Pleural effusion, not elsewhere classified 01/25/2018   Acute deep vein thrombosis (DVT) of left lower extremity ([place]) 01/24/2018   Acute deep vein thrombosis (DVT) of right lower extremity  ([place]) 01/24/2018   Chronic allergic rhinitis 01/24/2018   Depression with anxiety 01/24/2018   UTI due to Klebsiella species 01/24/2018   Protein-calorie malnutrition, severe ([place]) 01/24/2018   S/P insertion of IVC (inferior vena caval) filter 01/24/2018   Reactive depression    Hypokalemia    Leukocytosis    Essential hypertension    Trauma    Tetraparesis ([facility])    Neuropathic pain  Neurogenic bowel    Neurogenic bladder    Benign essential HTN    Acute blood loss anemia    Central cord syndrome at C6 level of cervical spinal cord ([place]) 11/29/2017   Central cord syndrome ([place]) 11/29/2017    Lewis Moccasin, PT 08/21/2020, 10:15 AM  [place] MAIN [facility] SERVICES [address] [place], [place], [address] Phone: [phone]   Fax:  [phone]  Name: Sherry [name] MRN: [id number] Date of Birth: 1934/05/08

## 2020-08-25 ENCOUNTER — Ambulatory Visit: Payer: Medicare HMO

## 2020-08-25 ENCOUNTER — Other Ambulatory Visit: Payer: Self-pay

## 2020-08-25 ENCOUNTER — Ambulatory Visit: Payer: Medicare HMO | Admitting: Occupational Therapy

## 2020-08-25 DIAGNOSIS — M6281 Muscle weakness (generalized): Secondary | ICD-10-CM

## 2020-08-25 DIAGNOSIS — R278 Other lack of coordination: Secondary | ICD-10-CM

## 2020-08-25 DIAGNOSIS — R269 Unspecified abnormalities of gait and mobility: Secondary | ICD-10-CM

## 2020-08-25 DIAGNOSIS — R262 Difficulty in walking, not elsewhere classified: Secondary | ICD-10-CM

## 2020-08-25 NOTE — Therapy (Signed)
[place] MAIN [facility] SERVICES [address] [place], [place], [address] Phone: [phone]   Fax:  [phone]  Physical Therapy Treatment  Patient Details  Name: Sherry [name] MRN: [id number] Date of Birth: [birth year] No data recorded  Encounter Date: 08/25/2020   PT End of Session - 08/25/20 1644     Visit Number 81    Number of Visits 104    Date for PT Re-Evaluation 09/24/20    Authorization Type aetna medicare FOTO performed by PT on eval (7/20), score 12, Progress note on 03/10/2020; PN on 08/20/2020    Authorization Time Period Recert [id number]; PN on 07/07/2020    PT Start Time 1145    PT Stop Time 1225    PT Time Calculation (min) 40 min    Equipment Utilized During Treatment Gait belt    Activity Tolerance Patient tolerated treatment well;Patient limited by fatigue    Behavior During Therapy WFL for tasks assessed/performed             Past Medical History:  Diagnosis Date   Acute blood loss anemia    Arthritis    Cancer ([place])    skin   Central cord syndrome at C6 level of cervical spinal cord ([place]) 11/29/2017   Hypertension    Protein-calorie malnutrition, severe ([place]) 01/24/2018   S/P insertion of IVC (inferior vena caval) filter 01/24/2018   Tetraparesis ([place])     Past Surgical History:  Procedure Laterality Date   ANTERIOR CERVICAL DECOMP/DISCECTOMY FUSION N/A 11/29/2017   Procedure: Cervical five-six, six-seven Anterior Cervical Decompression Fusion;  Surgeon: Judith Part, MD;  Location: [place];  Service: Neurosurgery;  Laterality: N/A;  Cervical five-six, six-seven Anterior Cervical Decompression Fusion   CATARACT EXTRACTION     IR IVC FILTER PLMT / S&I /IMG GUID/MOD SED  12/08/2017    There were no vitals filed for this visit.   Subjective Assessment - 08/25/20 1159     Subjective Patient reports doing well today with no complaints. States very pleased with her effort/performance last  visit.    Pertinent History Pt is an 86 yo female that fell in 2019, fractured vertebrae in her neck and in her low back per family. 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 After hospital stay, pt discharged to [facility] for ~1 month, experienced severe UTI as well as bilateral DVT (IVC filter placed). Discharged to [facility], stayed for about a year, and then moved to [facility] in April 2021. Was receiving PT, but per family facility reported that did not have adequate equipment to maximize PT for her. Pt until about 1 month ago was practicing sit to stand transfers with 1-2 people, and working on static standing. Has a brace for L foot due to PF. Pt currently needs assistance with all ADLs (able to assist with donning/doffing her shirt), bed baths, and needs a hoyer lift for transfers. Able to drive power wheelchair without assistance.    Limitations Standing;Walking;House hold activities;Lifting    How long can you sit comfortably? NA    How long can you stand comfortably? unable    How long can you walk comfortably? unable    Patient Stated Goals "to stand up and walk"    Currently in Pain? No/denies              Interventions:  Therapeutic activities:   Stand pivot transfer training- VC for feet placement only, Added a right knee brace that patient brought  in today. Patient able to stand with Mod A yet required Max assist to pivot today.    Static standing trials: Patient performed 5 trials of static stand ranging from 55 sec to 2 min 30 sec today- She exhibited increased difficulty versus previous visit with extending hips and maintaining erect posture. A soft red ball was added after 1st trial of standing to limit amount of knee valgus yet patient reported no difference noted. Patient required mod VC and more physical assist to maintain standing during each trials today.   Clinical Impression: Patient was limited by more fatigue than previous sessions today with  increased difficulty considering she was able to stand over 6 min last visit. Patient reported fatigue as limiting factor and just unable to maintain posture today with standing. Patient will benefit from continued PT services to increase mobility and strength to improve patient's quality of life                           PT Education - 08/26/20 1644     Education Details exercise technique    Person(s) Educated Patient    Methods Explanation;Demonstration;Tactile cues;Verbal cues    Comprehension Verbalized understanding;Returned demonstration;Verbal cues required;Tactile cues required;Need further instruction              PT Short Term Goals - 03/10/20 2211       PT SHORT TERM GOAL #1   Title The patient will perform initial HEP with minimum assistance in order to improve strength and function.    Baseline Patient demonstrating independence with initial HEP as of 03/10/2020 with no questions or difficulty.    Time 4    Period Weeks    Status Achieved    Target Date 02/11/20               PT Long Term Goals - 08/20/20 0706       PT LONG TERM GOAL #1   Title The patient will be compliant with finalized HEP with minimum assistance in preparation for self management and maintenance of condition.    Baseline 03/10/2020- Patient familiar with initial HEP and will keep goal active as HEP becomes more progressive including possible transfers and standing activities. 04/07/2020- Patient continues to report compliance with LE strengthening home exercises without questions or concerns. 04/21/2020- Patient able to verbalize and demonstrate good understanding of current HEP including some supine and seated LE strengthening exercises. Reviewed and patient performed 10 reps today with minimal cueing. Will keep goal active to progress HEP as appropriate. 05/28/2020- patient reports continues to perform LE strengthening as able and some help from caregiver as able. No  changes at this time to home program as patient is limited to supine/seated therex. 07/02/2020- Patient continues to report compliance with current supine and seated LE strengtheing home program and states no questions or concerns regarding her plan.    Time 12    Period Weeks    Status Achieved      PT LONG TERM GOAL #2   Title The patient will demonstrate at least 10 point improvement on FOTO score indicating an improved ability to perform functional activities.    Baseline on eval (/720) score 12; 10/22/19: 12; 12/10/19: 18, 12/13: 18: 04/07/2020- Will assess next visit. 07/02/2020- Will obtain next visit.    Time 12    Period Weeks    Status On-going    Target Date 09/24/20      PT LONG  TERM GOAL #3   Title The patient will demonstrate lateral scooting  for assistance with transfers with minA to maximize independence.    Baseline 10/22/19: Pt requires modA+1 for lateral scooting; 12/10/19: modA+1, 12/13: modA-maxA, 12/27: modA-maxA, 03/10/2020- ModA-MaxA- increased verbal cues and visual demonstration. 04/07/2020- Continued Max assist to perform forward/lateral scoot. 04/21/2020- Patient was able to demo slight lateral scoot with CGA approx 12 in then fatigued requiring max assist- she is able to scoot forward by leaning without significant issues. 05/28/2020- While sitting in chair- patient able to perform forward and lateral scoot with VC/visual demo and increased time allowed- CGA today but not consistent yet- will keep goal active at this time to continue to focus on strengthening and improving technique. 07/02/2020- Patient able to demonstrate minimal ability to laterally shift weigh on mat table requiring max assist yet is able to forward scoot out to edge of mat table with min Assist. 08/20/2020 - Patient was able to demonstrate minimal lateral scooting at edge of mat while holding feet still so they did not slip on tile surface. She continues to be able to demonstrate scoot forward and uses tilt option to  recline to position safely back into seat of power wheelchair.    Time 12    Period Weeks    Status On-going    Target Date 09/24/20      PT LONG TERM GOAL #4   Title The patient will demonstrate a squat pivot transfer with minimum assistance to maximize independence and mobility.    Baseline 10/22/19: unable to safely attempt at this time; 12/10/19: unable to safely attempt at this time, 01/14/20: unable to safety attempt at this time. 03/10/2020- Patient able to perform Stand pivot transfer with Maximal assist. 04/07/2020- Patient continues to require Max assist with sit to stand- will assist SPT next visit. 04/21/2020- Patient presents with Max assist to perform Sit to stand Transfer- unable to pivot or move Left LE well without difficulty. 07/02/2020= Max assist with SPT and minimal ability to pivot. 7/202/2022-Patient able to stand with minimal assist this visit (as good as CGA last visit) and able to move right foot to pivot and performing with moderate assist today.  ?    Time 12    Period Weeks    Status On-going    Target Date 09/24/20      PT LONG TERM GOAL #5   Title The patient will demonstrate sitting without UE support for 2-5 minutes at EOB to improve participation and maximize independence with ADLs.    Baseline 10/22/19: Pt able to sit unsupported for at least 2 minutes at edge of bed. 04/21/2020- Patient continues to demo good sitting at edge of mat > 5 miin without difficulty or back support.    Time 12    Period Weeks    Status Achieved      PT LONG TERM GOAL #6   Title Patient will demonstrate improved functional LE strength as seen by consistent ability to stand > 2 min (3 out of 4 trials) with Mod/Max A for improved LE strength with transfers.    Baseline 05/28/2020- Patient able to inconsistent stand 1-2 min right now in //bars with Mod/Max A. 07/02/2020- Patient was able to progress last 2 visit to standing using bilateral platform attachment between 1-2 min which is a progression  from the parallel bars. 08/20/2020- Patient performed static standing with min- mod A using bilateral platform attachment on walker for 6 min 30 sec today with assist for  trunk control and intermittent verbal cues for posture.    Time 12    Period Weeks    Status On-going    Target Date 09/24/20                   Plan - 08/25/20 1645     Clinical Impression Statement Patient was limited by more fatigue than previous sessions today with increased difficulty considering she was able to stand over 6 min last visit. Patient reported fatigue as limiting factor and just unable to maintain posture today with standing. Patient will benefit from continued PT services to increase mobility and strength to improve patient's quality of life    Personal Factors and Comorbidities Age;Time since onset of injury/illness/exacerbation;Comorbidity 3+;Fitness    Comorbidities HTN, quadriparesis, history of DVT, neurogenic bladder    Examination-Activity Limitations Bathing;Hygiene/Grooming;Squat;Bed Mobility;Lift;Bend;Stand;Engineer, manufacturing;Toileting;Self Feeding;Transfers;Continence;Sit;Dressing;Sleep;Carry    Examination-Participation Restrictions Church;Laundry;Cleaning;Medication Management;Community Activity;Meal Prep;Interpersonal Relationship    Stability/Clinical Decision Making Evolving/Moderate complexity    Rehab Potential Fair    PT Frequency 2x / week    PT Duration 12 weeks    PT Treatment/Interventions ADLs/Self Care Home Management;Electrical Stimulation;Therapeutic activities;Wheelchair mobility training;Vasopneumatic Device;Joint Manipulations;Vestibular;Passive range of motion;Patient/family education;Therapeutic exercise;DME Instruction;Biofeedback;Aquatic Therapy;Moist Heat;Gait training;Balance training;Orthotic Fit/Training;Dry needling;Energy conservation;Taping;Splinting;Neuromuscular re-education;Cryotherapy;Ultrasound;Functional mobility training    PT Next Visit Plan  Continue with progressive Standing/transfer training.    PT Home Exercise Plan no changes    Consulted and Agree with Plan of Care Patient             Patient will benefit from skilled therapeutic intervention in order to improve the following deficits and impairments:  Abnormal gait, Decreased balance, Decreased endurance, Decreased mobility, Difficulty walking, Hypomobility, Impaired sensation, Decreased range of motion, Improper body mechanics, Impaired perceived functional ability, Decreased activity tolerance, Decreased knowledge of use of DME, Decreased safety awareness, Decreased strength, Impaired flexibility, Impaired UE functional use, Postural dysfunction  Visit Diagnosis: Abnormality of gait and mobility  Difficulty in walking, not elsewhere classified  Muscle weakness (generalized)     Problem List Patient Active Problem List   Diagnosis Date Noted   Acute appendicitis with appendiceal abscess 05/30/2018   Hypocalcemia 02/15/2018   Dysuria 02/15/2018   Bilateral lower extremity edema 02/11/2018   Vaginal yeast infection 01/30/2018   Chest tightness 01/27/2018   Healthcare-associated pneumonia 01/25/2018   Pleural effusion, not elsewhere classified 01/25/2018   Acute deep vein thrombosis (DVT) of left lower extremity ([place]) 01/24/2018   Acute deep vein thrombosis (DVT) of right lower extremity ([place]) 01/24/2018   Chronic allergic rhinitis 01/24/2018   Depression with anxiety 01/24/2018   UTI due to Klebsiella species 01/24/2018   Protein-calorie malnutrition, severe ([place]) 01/24/2018   S/P insertion of IVC (inferior vena caval) filter 01/24/2018   Reactive depression    Hypokalemia    Leukocytosis    Essential hypertension    Trauma    Tetraparesis ([facility])    Neuropathic pain    Neurogenic bowel    Neurogenic bladder    Benign essential HTN    Acute blood loss anemia    Central cord syndrome at C6 level of cervical spinal cord ([place]) 11/29/2017   Central cord  syndrome ([place]) 11/29/2017    Lewis Moccasin, PT 08/26/2020, 4:51 PM  [place] MAIN [facility] SERVICES [address] [place], [place], [address] Phone: [phone]   Fax:  [phone]  Name: Sherry [name] MRN: [id number] Date of Birth: 03/06/1934

## 2020-08-25 NOTE — Therapy (Signed)
[place] MAIN [facility] SERVICES [address] [place], [place], [address] Phone: [phone]   Fax:  [phone]  Occupational Therapy Treatment  Patient Details  Name: Sherry [name] MRN: [id number] Date of Birth: [birth year] No data recorded  Encounter Date: 08/25/2020   OT End of Session - 08/25/20 1226     Visit Number 77    Number of Visits 45    Date for OT Re-Evaluation 10/22/20    Authorization Time Period Progress report period starting 07/30/2020    OT Start Time 1100    OT Stop Time 1145    OT Time Calculation (min) 45 min    Activity Tolerance Patient tolerated treatment well    Behavior During Therapy [facility] for tasks assessed/performed             Past Medical History:  Diagnosis Date   Acute blood loss anemia    Arthritis    Cancer ([place])    skin   Central cord syndrome at C6 level of cervical spinal cord ([place]) 11/29/2017   Hypertension    Protein-calorie malnutrition, severe ([place]) 01/24/2018   S/P insertion of IVC (inferior vena caval) filter 01/24/2018   Tetraparesis ([place])     Past Surgical History:  Procedure Laterality Date   ANTERIOR CERVICAL DECOMP/DISCECTOMY FUSION N/A 11/29/2017   Procedure: Cervical five-six, six-seven Anterior Cervical Decompression Fusion;  Surgeon: Judith Part, MD;  Location: [place];  Service: Neurosurgery;  Laterality: N/A;  Cervical five-six, six-seven Anterior Cervical Decompression Fusion   CATARACT EXTRACTION     IR IVC FILTER PLMT / S&I /IMG GUID/MOD SED  12/08/2017    There were no vitals filed for this visit.   Subjective Assessment - 08/25/20 1225     Subjective  Pt. reports that the state surveyors are at the facility where she resides this week.    Patient is accompanied by: Family member    Pertinent History Patient s/p fall November 29, 2017 resulting in diagnosis of central cord syndrome at C 6 level.  She has had therapy in multiple venues but with recent move to  [facility], therapy staff recommended she seek outpatient therapy for her needs.    Patient Stated Goals Patient would like to be able to move better in bed, stand and perform self care tasks.    Currently in Pain? No/denies            OT Treatment   Pt. tolerated bilateral shoulder flexion, extension, abduction, elbow flexion, extension forearm supination AAROM/AROM. PROM bilateral wrist extension, digit MP, PIP, and DIP flexion, and extension, thumb radial, and palmar abduction in conjunction with moist heat. Pt. worked on reaching using the [employer]. Pt. grasped and moved the shapes through 2 vertical dowels of varying heights. Pt. was able to perform the task more freely today.   Pt. is making progress, and is improving with BUE reaching. Pt. reports that she has been working with her shoulder ROM at home. Pt. continues to tolerate ROM, moist heat modality, and functional reaching without pain. Pt. was able  to move the shapes through 2 vertical dowels with both the right, and left UEs with increased time.No leaning noted today during reaching tasks. Pt. tolerated ROM well without reports of pain, or discomfort. Pt. continues to benefit from working on improving ROM in order to work towards increasing engagement of her bilateral hands during ADLs, and IADL tasks.  OT Education - 08/25/20 1226     Education Details BUE ROM with functional reaching    Person(s) Educated Patient;Caregiver(s)    Methods Explanation;Demonstration    Comprehension Verbalized understanding;Need further instruction                 OT Long Term Goals - 07/30/20 1139       OT LONG TERM GOAL #1   Title Patient and caregiver will demonstrate understanding of home exercise program for ROM.    Baseline Pt. continues to have a restorative aide rehab aide assist her wih exercises at [facility]    Time 12    Period Weeks    Status On-going    Target  Date 10/22/20      OT LONG TERM GOAL #4   Title Patient will demonstrate improved composite finger flexion to hold deodorant to apply to underarms.    Baseline Pt. continues to present with improving finger flexion of R hand for a partial gross grasp, but unable to grasp and hold items. pt. conitnues to present with limited digit flexion of L hand. Pt. has active left thumb abduction.    Time 12    Period Weeks    Status On-going    Target Date 10/22/20      Long Term Additional Goals   Additional Long Term Goals Yes      OT LONG TERM GOAL #6   Title Patient will increase right UE ROM to comb the back of her hair with modified independence.    Baseline 07/30/2020: Pt. is now able to comb the back of her hair with a pic, however continues to work on completing the task efficiently.    Time 12    Period Weeks    Status Partially Met    Target Date 10/22/20      OT LONG TERM GOAL #7   Title Pt. will independently, and efficiently send text messages on her phone.    Baseline 07/30/2020: Pt. continues to present with limited BUE strength, and FMC skills.    Time 12    Period Weeks    Status New    Target Date 07/30/20      OT LONG TERM GOAL #8   Title Pt. will write, and sign her name with 100% legibility, and modified independence.    Baseline Pt. continues to  consistently filling out her daily menu, and writing for crossword puzzles. Pt. is able to maintain grasp on a wide width pen. [place] to work on writing legibility.    Time 12    Period Weeks    Status On-going    Target Date 10/22/20                   Plan - 08/25/20 1226     Clinical Impression Statement Pt. is making progress, and is improving with BUE reaching. Pt. reports that she has been working with her shoulder ROM at home. Pt. continues to tolerate ROM, moist heat modality, and functional reaching without pain. Pt. was able  to move the shapes through 2 vertical dowels with both the right, and left UEs  with increased time.No leaning noted today during reaching tasks. Pt. tolerated ROM well without reports of pain, or discomfort. Pt. continues to benefit from working on improving ROM in order to work towards increasing engagement of her bilateral hands during ADLs, and IADL tasks.          OT Occupational Profile and History   Detailed Assessment- Review of Records and additional review of physical, cognitive, psychosocial history related to current functional performance    Body Structure / Function / Physical Skills ADL;Continence;Dexterity;Flexibility;Strength;ROM;Balance;Coordination;FMC;IADL;Endurance;UE functional use;Decreased knowledge of use of DME;GMC    Psychosocial Skills Environmental  Adaptations;Habits;Routines and Behaviors    Rehab Potential Fair    Clinical Decision Making Several treatment options, min-mod task modification necessary    Comorbidities Affecting Occupational Performance: Presence of comorbidities impacting occupational performance    Comorbidities impacting occupational performance description: contractures of bilateral hands, dependent transfers,    Modification or Assistance to Complete Evaluation  No modification of tasks or assist necessary to complete eval    OT Frequency 2x / week    OT Duration 12 weeks    OT Treatment/Interventions Self-care/ADL training;Cryotherapy;Therapeutic exercise;DME and/or AE instruction;Balance training;Neuromuscular education;Manual Therapy;Splinting;Moist Heat;Passive range of motion;Therapeutic activities;Patient/family education    Consulted and Agree with Plan of Care Patient             Patient will benefit from skilled therapeutic intervention in order to improve the following deficits and impairments:   Body Structure / Function / Physical Skills: ADL, Continence, Dexterity, Flexibility, Strength, ROM, Balance, Coordination, FMC, IADL, Endurance, UE functional use, Decreased knowledge of use of DME, GMC    Psychosocial Skills: Environmental  Adaptations, Habits, Routines and Behaviors   Visit Diagnosis: Muscle weakness (generalized)  Other lack of coordination    Problem List Patient Active Problem List   Diagnosis Date Noted   Acute appendicitis with appendiceal abscess 05/30/2018   Hypocalcemia 02/15/2018   Dysuria 02/15/2018   Bilateral lower extremity edema 02/11/2018   Vaginal yeast infection 01/30/2018   Chest tightness 01/27/2018   Healthcare-associated pneumonia 01/25/2018   Pleural effusion, not elsewhere classified 01/25/2018   Acute deep vein thrombosis (DVT) of left lower extremity ([facility]) 01/24/2018   Acute deep vein thrombosis (DVT) of right lower extremity (HCC) 01/24/2018   Chronic allergic rhinitis 01/24/2018   Depression with anxiety 01/24/2018   UTI due to Klebsiella species 01/24/2018   Protein-calorie malnutrition, severe ([facility]) 01/24/2018   S/P insertion of IVC (inferior vena caval) filter 01/24/2018   Reactive depression    Hypokalemia    Leukocytosis    Essential hypertension    Trauma    Tetraparesis (HCC)    Neuropathic pain    Neurogenic bowel    Neurogenic bladder    Benign essential HTN    Acute blood loss anemia    Central cord syndrome at C6 level of cervical spinal cord (HCC) 11/29/2017   Central cord syndrome (HCC) 11/29/2017    Elaine Jagentenfl, MS, OTR/L 08/25/2020, 12:49 PM  [place] [facility] MA[facility] REHAB SERVICES [address] [place], [place], [address] Phone: [phone]   Fax:  [phone]  Name: Anaya A Shrieves MRN: [id number] Date of Birth: 03/15/1934  

## 2020-08-27 ENCOUNTER — Ambulatory Visit: Payer: Medicare HMO

## 2020-08-27 ENCOUNTER — Ambulatory Visit: Payer: Medicare HMO | Admitting: Occupational Therapy

## 2020-09-01 ENCOUNTER — Other Ambulatory Visit: Payer: Self-pay

## 2020-09-01 ENCOUNTER — Encounter: Payer: Self-pay | Admitting: Occupational Therapy

## 2020-09-01 ENCOUNTER — Ambulatory Visit: Payer: Medicare HMO

## 2020-09-01 ENCOUNTER — Ambulatory Visit: Payer: Medicare HMO | Attending: Internal Medicine | Admitting: Occupational Therapy

## 2020-09-01 DIAGNOSIS — R140 Abdominal distension (gaseous): Secondary | ICD-10-CM | POA: Diagnosis not present

## 2020-09-01 DIAGNOSIS — R278 Other lack of coordination: Secondary | ICD-10-CM | POA: Insufficient documentation

## 2020-09-01 DIAGNOSIS — M6281 Muscle weakness (generalized): Secondary | ICD-10-CM | POA: Insufficient documentation

## 2020-09-01 DIAGNOSIS — S14129S Central cord syndrome at unspecified level of cervical spinal cord, sequela: Secondary | ICD-10-CM | POA: Insufficient documentation

## 2020-09-01 DIAGNOSIS — R2689 Other abnormalities of gait and mobility: Secondary | ICD-10-CM | POA: Diagnosis not present

## 2020-09-01 DIAGNOSIS — R262 Difficulty in walking, not elsewhere classified: Secondary | ICD-10-CM | POA: Insufficient documentation

## 2020-09-01 DIAGNOSIS — R269 Unspecified abnormalities of gait and mobility: Secondary | ICD-10-CM | POA: Diagnosis present

## 2020-09-01 NOTE — Therapy (Signed)
[place] MAIN [facility] SERVICES [address] [place], [place], [address] Phone: [phone]   Fax:  [phone]  Physical Therapy Treatment  Patient Details  Name: Sherry [name] MRN: [id number] Date of Birth: 1934-03-20 No data recorded  Encounter Date: 09/01/2020   PT End of Session - 09/01/20 1254     Visit Number 82    Number of Visits 104    Date for PT Re-Evaluation 09/24/20    Authorization Type aetna medicare FOTO performed by PT on eval (7/20), score 12, Progress note on 03/10/2020; PN on 08/20/2020    Authorization Time Period Recert [id number]; PN on 07/07/2020    PT Start Time 1145    PT Stop Time 1225    PT Time Calculation (min) 40 min    Equipment Utilized During Treatment Gait belt    Activity Tolerance Patient tolerated treatment well;Patient limited by fatigue    Behavior During Therapy WFL for tasks assessed/performed             Past Medical History:  Diagnosis Date   Acute blood loss anemia    Arthritis    Cancer ([place])    skin   Central cord syndrome at C6 level of cervical spinal cord ([place]) 11/29/2017   Hypertension    Protein-calorie malnutrition, severe ([place]) 01/24/2018   S/P insertion of IVC (inferior vena caval) filter 01/24/2018   Tetraparesis ([place])     Past Surgical History:  Procedure Laterality Date   ANTERIOR CERVICAL DECOMP/DISCECTOMY FUSION N/A 11/29/2017   Procedure: Cervical five-six, six-seven Anterior Cervical Decompression Fusion;  Surgeon: Judith Part, MD;  Location: [place];  Service: Neurosurgery;  Laterality: N/A;  Cervical five-six, six-seven Anterior Cervical Decompression Fusion   CATARACT EXTRACTION     IR IVC FILTER PLMT / S&I /IMG GUID/MOD SED  12/08/2017    There were no vitals filed for this visit.   Subjective Assessment - 09/01/20 1253     Subjective Patient reports doing well without pain or complaints. States she didn't do much this weekend.    Pertinent  History Pt is an 86 yo female that fell in 2019, fractured vertebrae in her neck and in her low back per family. 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 After hospital stay, pt discharged to [facility] for ~1 month, experienced severe UTI as well as bilateral DVT (IVC filter placed). Discharged to [facility], stayed for about a year, and then moved to [facility] in April 2021. Was receiving PT, but per family facility reported that did not have adequate equipment to maximize PT for her. Pt until about 1 month ago was practicing sit to stand transfers with 1-2 people, and working on static standing. Has a brace for L foot due to PF. Pt currently needs assistance with all ADLs (able to assist with donning/doffing her shirt), bed baths, and needs a hoyer lift for transfers. Able to drive power wheelchair without assistance.    Limitations Standing;Walking;House hold activities;Lifting    How long can you sit comfortably? NA    How long can you stand comfortably? unable    How long can you walk comfortably? unable    Patient Stated Goals "to stand up and walk"    Currently in Pain? No/denies               Interventions:  Therapeutic activities:   Stand pivot transfer training- Patient able to position feet in approx 45 deg angle to prepare for  transfer without VC today.  Patient able to stand with Mod A yet and able to slightly move right LE to pivot with VC.   Patient performed 3 total trials- On 2nd and 3rd trial = patient able to slightly move Left LE to pivot yet on last trial when she tried to pivot on left her leg kicked out requiring Mod assist to maintain posture.    Static standing trials: Patient performed 3  trials of static stand 2-2:30 min each trial- She exhibited continued difficulty hip extension and maintaining erect posture.    Clinical Impression: Patient demonstrated improved ability to pivot (move LE's today). She continues to stand well with mod A yet  continues to have some difficulty with maintaining trunk ext.  Patient will benefit from continued PT services to increase mobility and strength to improve patient's quality of life.                         PT Education - 09/01/20 1254     Education Details exercise/Posture/transfer technique    Person(s) Educated Patient    Methods Explanation;Demonstration;Tactile cues;Verbal cues    Comprehension Verbalized understanding;Returned demonstration;Verbal cues required;Tactile cues required;Need further instruction              PT Short Term Goals - 03/10/20 2211       PT SHORT TERM GOAL #1   Title The patient will perform initial HEP with minimum assistance in order to improve strength and function.    Baseline Patient demonstrating independence with initial HEP as of 03/10/2020 with no questions or difficulty.    Time 4    Period Weeks    Status Achieved    Target Date 02/11/20               PT Long Term Goals - 08/20/20 0706       PT LONG TERM GOAL #1   Title The patient will be compliant with finalized HEP with minimum assistance in preparation for self management and maintenance of condition.    Baseline 03/10/2020- Patient familiar with initial HEP and will keep goal active as HEP becomes more progressive including possible transfers and standing activities. 04/07/2020- Patient continues to report compliance with LE strengthening home exercises without questions or concerns. 04/21/2020- Patient able to verbalize and demonstrate good understanding of current HEP including some supine and seated LE strengthening exercises. Reviewed and patient performed 10 reps today with minimal cueing. Will keep goal active to progress HEP as appropriate. 05/28/2020- patient reports continues to perform LE strengthening as able and some help from caregiver as able. No changes at this time to home program as patient is limited to supine/seated therex. 07/02/2020- Patient  continues to report compliance with current supine and seated LE strengtheing home program and states no questions or concerns regarding her plan.    Time 12    Period Weeks    Status Achieved      PT LONG TERM GOAL #2   Title The patient will demonstrate at least 10 point improvement on FOTO score indicating an improved ability to perform functional activities.    Baseline on eval (/720) score 12; 10/22/19: 12; 12/10/19: 18, 12/13: 18: 04/07/2020- Will assess next visit. 07/02/2020- Will obtain next visit.    Time 12    Period Weeks    Status On-going    Target Date 09/24/20      PT LONG TERM GOAL #3   Title The patient  will demonstrate lateral scooting  for assistance with transfers with minA to maximize independence.    Baseline 10/22/19: Pt requires modA+1 for lateral scooting; 12/10/19: modA+1, 12/13: modA-maxA, 12/27: modA-maxA, 03/10/2020- ModA-MaxA- increased verbal cues and visual demonstration. 04/07/2020- Continued Max assist to perform forward/lateral scoot. 04/21/2020- Patient was able to demo slight lateral scoot with CGA approx 12 in then fatigued requiring max assist- she is able to scoot forward by leaning without significant issues. 05/28/2020- While sitting in chair- patient able to perform forward and lateral scoot with VC/visual demo and increased time allowed- CGA today but not consistent yet- will keep goal active at this time to continue to focus on strengthening and improving technique. 07/02/2020- Patient able to demonstrate minimal ability to laterally shift weigh on mat table requiring max assist yet is able to forward scoot out to edge of mat table with min Assist. 08/20/2020 - Patient was able to demonstrate minimal lateral scooting at edge of mat while holding feet still so they did not slip on tile surface. She continues to be able to demonstrate scoot forward and uses tilt option to recline to position safely back into seat of power wheelchair.    Time 12    Period Weeks    Status  On-going    Target Date 09/24/20      PT LONG TERM GOAL #4   Title The patient will demonstrate a squat pivot transfer with minimum assistance to maximize independence and mobility.    Baseline 10/22/19: unable to safely attempt at this time; 12/10/19: unable to safely attempt at this time, 01/14/20: unable to safety attempt at this time. 03/10/2020- Patient able to perform Stand pivot transfer with Maximal assist. 04/07/2020- Patient continues to require Max assist with sit to stand- will assist SPT next visit. 04/21/2020- Patient presents with Max assist to perform Sit to stand Transfer- unable to pivot or move Left LE well without difficulty. 07/02/2020= Max assist with SPT and minimal ability to pivot. 7/202/2022-Patient able to stand with minimal assist this visit (as good as CGA last visit) and able to move right foot to pivot and performing with moderate assist today.  ?    Time 12    Period Weeks    Status On-going    Target Date 09/24/20      PT LONG TERM GOAL #5   Title The patient will demonstrate sitting without UE support for 2-5 minutes at EOB to improve participation and maximize independence with ADLs.    Baseline 10/22/19: Pt able to sit unsupported for at least 2 minutes at edge of bed. 04/21/2020- Patient continues to demo good sitting at edge of mat > 5 miin without difficulty or back support.    Time 12    Period Weeks    Status Achieved      PT LONG TERM GOAL #6   Title Patient will demonstrate improved functional LE strength as seen by consistent ability to stand > 2 min (3 out of 4 trials) with Mod/Max A for improved LE strength with transfers.    Baseline 05/28/2020- Patient able to inconsistent stand 1-2 min right now in //bars with Mod/Max A. 07/02/2020- Patient was able to progress last 2 visit to standing using bilateral platform attachment between 1-2 min which is a progression from the parallel bars. 08/20/2020- Patient performed static standing with min- mod A using bilateral  platform attachment on walker for 6 min 30 sec today with assist for trunk control and intermittent verbal cues for posture.  Time 12    Period Weeks    Status On-going    Target Date 09/24/20                   Plan - 09/01/20 1255     Clinical Impression Statement Patient demonstrated improved ability to pivot (move LE's today). She continues to stand well with mod A yet continues to have some difficulty with maintaining trunk ext.  Patient will benefit from continued PT services to increase mobility and strength to improve patient's quality of life.    Personal Factors and Comorbidities Age;Time since onset of injury/illness/exacerbation;Comorbidity 3+;Fitness    Comorbidities HTN, quadriparesis, history of DVT, neurogenic bladder    Examination-Activity Limitations Bathing;Hygiene/Grooming;Squat;Bed Mobility;Lift;Bend;Stand;Engineer, manufacturing;Toileting;Self Feeding;Transfers;Continence;Sit;Dressing;Sleep;Carry    Examination-Participation Restrictions Church;Laundry;Cleaning;Medication Management;Community Activity;Meal Prep;Interpersonal Relationship    Stability/Clinical Decision Making Evolving/Moderate complexity    Rehab Potential Fair    PT Frequency 2x / week    PT Duration 12 weeks    PT Treatment/Interventions ADLs/Self Care Home Management;Electrical Stimulation;Therapeutic activities;Wheelchair mobility training;Vasopneumatic Device;Joint Manipulations;Vestibular;Passive range of motion;Patient/family education;Therapeutic exercise;DME Instruction;Biofeedback;Aquatic Therapy;Moist Heat;Gait training;Balance training;Orthotic Fit/Training;Dry needling;Energy conservation;Taping;Splinting;Neuromuscular re-education;Cryotherapy;Ultrasound;Functional mobility training    PT Next Visit Plan Continue with progressive Standing/transfer training.    PT Home Exercise Plan no changes    Consulted and Agree with Plan of Care Patient             Patient will  benefit from skilled therapeutic intervention in order to improve the following deficits and impairments:  Abnormal gait, Decreased balance, Decreased endurance, Decreased mobility, Difficulty walking, Hypomobility, Impaired sensation, Decreased range of motion, Improper body mechanics, Impaired perceived functional ability, Decreased activity tolerance, Decreased knowledge of use of DME, Decreased safety awareness, Decreased strength, Impaired flexibility, Impaired UE functional use, Postural dysfunction  Visit Diagnosis: Abnormality of gait and mobility  Difficulty in walking, not elsewhere classified  Muscle weakness (generalized)     Problem List Patient Active Problem List   Diagnosis Date Noted   Acute appendicitis with appendiceal abscess 05/30/2018   Hypocalcemia 02/15/2018   Dysuria 02/15/2018   Bilateral lower extremity edema 02/11/2018   Vaginal yeast infection 01/30/2018   Chest tightness 01/27/2018   Healthcare-associated pneumonia 01/25/2018   Pleural effusion, not elsewhere classified 01/25/2018   Acute deep vein thrombosis (DVT) of left lower extremity ([place]) 01/24/2018   Acute deep vein thrombosis (DVT) of right lower extremity ([place]) 01/24/2018   Chronic allergic rhinitis 01/24/2018   Depression with anxiety 01/24/2018   UTI due to Klebsiella species 01/24/2018   Protein-calorie malnutrition, severe ([place]) 01/24/2018   S/P insertion of IVC (inferior vena caval) filter 01/24/2018   Reactive depression    Hypokalemia    Leukocytosis    Essential hypertension    Trauma    Tetraparesis ([facility])    Neuropathic pain    Neurogenic bowel    Neurogenic bladder    Benign essential HTN    Acute blood loss anemia    Central cord syndrome at C6 level of cervical spinal cord ([place]) 11/29/2017   Central cord syndrome ([place]) 11/29/2017    Lewis Moccasin, PT 09/02/2020, 8:13 AM  [place] [address]  [place], [place], [address] Phone: [phone]   Fax:  [phone]  Name: Sherry [name] MRN: [id number] Date of Birth: 1934-10-18

## 2020-09-01 NOTE — Therapy (Signed)
[place] MAIN [facility] SERVICES [address] [place], [place], [address] Phone: [phone]   Fax:  [phone]  Occupational Therapy Treatment  Patient Details  Name: Sherry [name] MRN: [id number] Date of Birth: [birth year] No data recorded  Encounter Date: 09/01/2020   OT End of Session - 09/01/20 1328     Visit Number 68    Number of Visits 84    Date for OT Re-Evaluation 10/22/20    Authorization Time Period Progress report period starting 07/30/2020    OT Start Time 87    OT Stop Time 1145    OT Time Calculation (min) 45 min    Equipment Utilized During Treatment moist heat    Activity Tolerance Patient tolerated treatment well    Behavior During Therapy WFL for tasks assessed/performed             Past Medical History:  Diagnosis Date   Acute blood loss anemia    Arthritis    Cancer ([place])    skin   Central cord syndrome at C6 level of cervical spinal cord ([place]) 11/29/2017   Hypertension    Protein-calorie malnutrition, severe ([place]) 01/24/2018   S/P insertion of IVC (inferior vena caval) filter 01/24/2018   Tetraparesis ([place])     Past Surgical History:  Procedure Laterality Date   ANTERIOR CERVICAL DECOMP/DISCECTOMY FUSION N/A 11/29/2017   Procedure: Cervical five-six, six-seven Anterior Cervical Decompression Fusion;  Surgeon: Judith Part, MD;  Location: [place];  Service: Neurosurgery;  Laterality: N/A;  Cervical five-six, six-seven Anterior Cervical Decompression Fusion   CATARACT EXTRACTION     IR IVC FILTER PLMT / S&I /IMG GUID/MOD SED  12/08/2017    There were no vitals filed for this visit.   Subjective Assessment - 09/01/20 1155     Subjective  Pt. reports no pain today.    Patient is accompanied by: Family member    Pertinent History Patient s/p fall November 29, 2017 resulting in diagnosis of central cord syndrome at C 6 level.  She has had therapy in multiple venues but with recent move to [facility], therapy staff recommended she seek outpatient therapy for her needs.    Patient Stated Goals Patient would like to be able to move better in bed, stand and perform self care tasks.    Currently in Pain? No/denies            OT Treatment   Pt. tolerated bilateral shoulder flexion, extension, abduction, elbow flexion, extension forearm supination AAROM/AROM. PROM bilateral wrist extension, digit MP, PIP, and DIP flexion, and extension, thumb radial, and palmar abduction in conjunction with moist heat. Pt. worked on reaching using the [employer]. Pt. grasped and moved the shapes through 2 vertical dowels of varying heights x's 2 reps. Pt. was able to perform the task more freely today.   Pt. is making progress, and is improving with BUE reaching. Pt. reports that she has been working with her shoulder ROM at home. Pt. continues to tolerate ROM, moist heat modality, and functional reaching without pain. Pt. was able  to move the shapes through 2 vertical dowels with both the right, and left UEs with less time required, and increased reps. No leaning noted today during reaching tasks. Pt. tolerated ROM well without reports of pain, or discomfort. Pt. continues to benefit from working on improving ROM in order to work towards increasing engagement of her bilateral hands during ADLs, and IADL tasks.  OT Education - 09/01/20 1108     Education Details BUE ROM with functional reaching    Person(s) Educated Patient;Caregiver(s)    Methods Explanation;Demonstration    Comprehension Verbalized understanding;Need further instruction                 OT Long Term Goals - 07/30/20 1139       OT LONG TERM GOAL #1   Title Patient and caregiver will demonstrate understanding of home exercise program for ROM.    Baseline Pt. continues to have a restorative aide rehab aide assist her wih exercises at [facility]    Time 12    Period Weeks     Status On-going    Target Date 10/22/20      OT LONG TERM GOAL #4   Title Patient will demonstrate improved composite finger flexion to hold deodorant to apply to underarms.    Baseline Pt. continues to present with improving finger flexion of R hand for a partial gross grasp, but unable to grasp and hold items. pt. conitnues to present with limited digit flexion of L hand. Pt. has active left thumb abduction.    Time 12    Period Weeks    Status On-going    Target Date 10/22/20      Long Term Additional Goals   Additional Long Term Goals Yes      OT LONG TERM GOAL #6   Title Patient will increase right UE ROM to comb the back of her hair with modified independence.    Baseline 07/30/2020: Pt. is now able to comb the back of her hair with a pic, however continues to work on completing the task efficiently.    Time 12    Period Weeks    Status Partially Met    Target Date 10/22/20      OT LONG TERM GOAL #7   Title Pt. will independently, and efficiently send text messages on her phone.    Baseline 07/30/2020: Pt. continues to present with limited BUE strength, and [facility] skills.    Time 12    Period Weeks    Status New    Target Date 07/30/20      OT LONG TERM GOAL #8   Title Pt. will write, and sign her name with 100% legibility, and modified independence.    Baseline Pt. continues to  consistently filling out her daily menu, and writing for crossword puzzles. Pt. is able to maintain grasp on a wide width pen. [place] to work on Media planner.    Time 12    Period Weeks    Status On-going    Target Date 10/22/20                   Plan - 09/01/20 1307     Clinical Impression Statement Pt. is making progress, and is improving with BUE reaching. Pt. reports that she has been working with her shoulder ROM at home. Pt. continues to tolerate ROM, moist heat modality, and functional reaching without pain. Pt. was able  to move the shapes through 2 vertical dowels with  both the right, and left UEs with less time required, and increased reps. No leaning noted today during reaching tasks. Pt. tolerated ROM well without reports of pain, or discomfort. Pt. continues to benefit from working on improving ROM in order to work towards increasing engagement of her bilateral hands during ADLs, and IADL tasks.    OT Occupational Profile and History Detailed  Assessment- Review of Records and additional review of physical, cognitive, psychosocial history related to current functional performance    Occupational performance deficits (Please refer to evaluation for details): ADL's;IADL's;Leisure;Social Participation    Body Structure / Function / Physical Skills ADL;Continence;Dexterity;Flexibility;Strength;ROM;Balance;Coordination;FMC;IADL;Endurance;UE functional use;Decreased knowledge of use of DME;GMC    Psychosocial Skills Environmental  Adaptations;Habits;Routines and Behaviors    Rehab Potential Fair    Clinical Decision Making Several treatment options, min-mod task modification necessary    Comorbidities Affecting Occupational Performance: Presence of comorbidities impacting occupational performance    Comorbidities impacting occupational performance description: contractures of bilateral hands, dependent transfers,    Modification or Assistance to Complete Evaluation  No modification of tasks or assist necessary to complete eval    OT Frequency 2x / week    OT Duration 12 weeks    OT Treatment/Interventions Self-care/ADL training;Cryotherapy;Therapeutic exercise;DME and/or AE instruction;Balance training;Neuromuscular education;Manual Therapy;Splinting;Moist Heat;Passive range of motion;Therapeutic activities;Patient/family education    Consulted and Agree with Plan of Care Patient             Patient will benefit from skilled therapeutic intervention in order to improve the following deficits and impairments:   Body Structure / Function / Physical Skills: ADL,  Continence, Dexterity, Flexibility, Strength, ROM, Balance, Coordination, FMC, IADL, Endurance, UE functional use, Decreased knowledge of use of DME, GMC   Psychosocial Skills: Environmental  Adaptations, Habits, Routines and Behaviors   Visit Diagnosis: Muscle weakness (generalized)  Other lack of coordination    Problem List Patient Active Problem List   Diagnosis Date Noted   Acute appendicitis with appendiceal abscess 05/30/2018   Hypocalcemia 02/15/2018   Dysuria 02/15/2018   Bilateral lower extremity edema 02/11/2018   Vaginal yeast infection 01/30/2018   Chest tightness 01/27/2018   Healthcare-associated pneumonia 01/25/2018   Pleural effusion, not elsewhere classified 01/25/2018   Acute deep vein thrombosis (DVT) of left lower extremity ([place]) 01/24/2018   Acute deep vein thrombosis (DVT) of right lower extremity ([place]) 01/24/2018   Chronic allergic rhinitis 01/24/2018   Depression with anxiety 01/24/2018   UTI due to Klebsiella species 01/24/2018   Protein-calorie malnutrition, severe ([place]) 01/24/2018   S/P insertion of IVC (inferior vena caval) filter 01/24/2018   Reactive depression    Hypokalemia    Leukocytosis    Essential hypertension    Trauma    Tetraparesis ([facility])    Neuropathic pain    Neurogenic bowel    Neurogenic bladder    Benign essential HTN    Acute blood loss anemia    Central cord syndrome at C6 level of cervical spinal cord ([place]) 11/29/2017   Central cord syndrome ([place]) 11/29/2017    Harrel Carina, MS, OTR/L 09/01/2020, 1:29 PM  [place] MAIN [facility] SERVICES [address] [place], [place], [address] Phone: [phone]   Fax:  [phone]  Name: MIRYAM MCELHINNEY MRN: [id number] Date of Birth: 1934-02-20

## 2020-09-03 ENCOUNTER — Ambulatory Visit: Payer: Medicare HMO

## 2020-09-03 ENCOUNTER — Encounter: Payer: Self-pay | Admitting: Occupational Therapy

## 2020-09-03 ENCOUNTER — Ambulatory Visit: Payer: Medicare HMO | Admitting: Occupational Therapy

## 2020-09-03 ENCOUNTER — Other Ambulatory Visit: Payer: Self-pay

## 2020-09-03 DIAGNOSIS — R269 Unspecified abnormalities of gait and mobility: Secondary | ICD-10-CM

## 2020-09-03 DIAGNOSIS — R278 Other lack of coordination: Secondary | ICD-10-CM

## 2020-09-03 DIAGNOSIS — M6281 Muscle weakness (generalized): Secondary | ICD-10-CM | POA: Diagnosis not present

## 2020-09-03 DIAGNOSIS — R262 Difficulty in walking, not elsewhere classified: Secondary | ICD-10-CM

## 2020-09-03 NOTE — Therapy (Signed)
[place] MAIN [facility] SERVICES [address] [place], [place], [address] Phone: [phone]   Fax:  [phone]  Occupational Therapy Treatment  Patient Details  Name: Sherry [name] MRN: [id number] Date of Birth: 03/27/1934 No data recorded  Encounter Date: 09/03/2020   OT End of Session - 09/03/20 1154     Visit Number 47    Number of Visits 2    Date for OT Re-Evaluation 10/22/20    Authorization Time Period Progress report period starting 07/30/2020    OT Start Time 1100    OT Stop Time 1145    OT Time Calculation (min) 45 min    Activity Tolerance Patient tolerated treatment well    Behavior During Therapy [facility] for tasks assessed/performed             Past Medical History:  Diagnosis Date   Acute blood loss anemia    Arthritis    Cancer ([place])    skin   Central cord syndrome at C6 level of cervical spinal cord ([place]) 11/29/2017   Hypertension    Protein-calorie malnutrition, severe ([place]) 01/24/2018   S/P insertion of IVC (inferior vena caval) filter 01/24/2018   Tetraparesis ([place])     Past Surgical History:  Procedure Laterality Date   ANTERIOR CERVICAL DECOMP/DISCECTOMY FUSION N/A 11/29/2017   Procedure: Cervical five-six, six-seven Anterior Cervical Decompression Fusion;  Surgeon: Judith Part, MD;  Location: [place];  Service: Neurosurgery;  Laterality: N/A;  Cervical five-six, six-seven Anterior Cervical Decompression Fusion   CATARACT EXTRACTION     IR IVC FILTER PLMT / S&I /IMG GUID/MOD SED  12/08/2017    There were no vitals filed for this visit.   Subjective Assessment - 09/03/20 1153     Subjective  Pt. reports no pain    Patient is accompanied by: Family member    Pertinent History Patient s/p fall November 29, 2017 resulting in diagnosis of central cord syndrome at C 6 level.  She has had therapy in multiple venues but with recent move to [facility], therapy staff recommended she seek outpatient therapy  for her needs.    Patient Stated Goals Patient would like to be able to move better in bed, stand and perform self care tasks.    Currently in Pain? No/denies    Pain Score 0-No pain            OT Treatment   Pt. tolerated bilateral shoulder flexion, extension, abduction, elbow flexion, extension forearm supination AAROM/AROM. PROM bilateral wrist extension, digit MP, PIP, and DIP flexion, and extension, thumb radial, and palmar abduction in conjunction with moist heat. Pt. worked on reaching using the [employer]. Pt. grasped and moved the shapes through 2 vertical dowels of varying heights x's 2 reps. Pt. was able to perform the task more freely today. Pt. Worked on Bilateral UE reaching, and ROM in order to improve functional reaching for ADL, IADL tasks, and haircare.   Pt. is making progress, and is improving with BUE reaching in preparation for improving overhead ADLs, and haircare. Pt. reports that she has been working with her shoulder ROM at home. Pt. continues to tolerate ROM, moist heat modality, and functional reaching without pain. Pt. was able  to move the shapes through 2 vertical dowels with both the right, and left UEs with less time required, and increased reps. No leaning noted today during reaching tasks. Pt. tolerated ROM well without reports of pain, or discomfort. Pt. continues to benefit  from working on improving ROM in order to work towards increasing engagement of her bilateral hands during ADLs, and IADL tasks.                       OT Education - 09/03/20 1154     Education Details BUE ROM with functional reaching    Person(s) Educated Patient;Caregiver(s)    Methods Explanation;Demonstration    Comprehension Verbalized understanding;Need further instruction                 OT Long Term Goals - 07/30/20 1139       OT LONG TERM GOAL #1   Title Patient and caregiver will demonstrate understanding of home exercise program for ROM.     Baseline Pt. continues to have a restorative aide rehab aide assist her wih exercises at [facility]    Time 12    Period Weeks    Status On-going    Target Date 10/22/20      OT LONG TERM GOAL #4   Title Patient will demonstrate improved composite finger flexion to hold deodorant to apply to underarms.    Baseline Pt. continues to present with improving finger flexion of R hand for a partial gross grasp, but unable to grasp and hold items. pt. conitnues to present with limited digit flexion of L hand. Pt. has active left thumb abduction.    Time 12    Period Weeks    Status On-going    Target Date 10/22/20      Long Term Additional Goals   Additional Long Term Goals Yes      OT LONG TERM GOAL #6   Title Patient will increase right UE ROM to comb the back of her hair with modified independence.    Baseline 07/30/2020: Pt. is now able to comb the back of her hair with a pic, however continues to work on completing the task efficiently.    Time 12    Period Weeks    Status Partially Met    Target Date 10/22/20      OT LONG TERM GOAL #7   Title Pt. will independently, and efficiently send text messages on her phone.    Baseline 07/30/2020: Pt. continues to present with limited BUE strength, and [facility] skills.    Time 12    Period Weeks    Status New    Target Date 07/30/20      OT LONG TERM GOAL #8   Title Pt. will write, and sign her name with 100% legibility, and modified independence.    Baseline Pt. continues to  consistently filling out her daily menu, and writing for crossword puzzles. Pt. is able to maintain grasp on a wide width pen. [place] to work on Media planner.    Time 12    Period Weeks    Status On-going    Target Date 10/22/20                   Plan - 09/03/20 1155     Clinical Impression Statement Pt. is making progress, and is improving with BUE reaching in preparation for improving overhead ADLs, and haircare. Pt. reports that she has been  working with her shoulder ROM at home. Pt. continues to tolerate ROM, moist heat modality, and functional reaching without pain. Pt. was able  to move the shapes through 2 vertical dowels with both the right, and left UEs with less time required, and increased reps.  No leaning noted today during reaching tasks. Pt. tolerated ROM well without reports of pain, or discomfort. Pt. continues to benefit from working on improving ROM in order to work towards increasing engagement of her bilateral hands during ADLs, and IADL tasks.    OT Occupational Profile and History Detailed Assessment- Review of Records and additional review of physical, cognitive, psychosocial history related to current functional performance    Occupational performance deficits (Please refer to evaluation for details): ADL's;IADL's;Leisure;Social Participation    Body Structure / Function / Physical Skills ADL;Continence;Dexterity;Flexibility;Strength;ROM;Balance;Coordination;FMC;IADL;Endurance;UE functional use;Decreased knowledge of use of DME;GMC    Psychosocial Skills Environmental  Adaptations;Habits;Routines and Behaviors    Rehab Potential Fair    Clinical Decision Making Several treatment options, min-mod task modification necessary    Comorbidities Affecting Occupational Performance: Presence of comorbidities impacting occupational performance    Comorbidities impacting occupational performance description: contractures of bilateral hands, dependent transfers,    Modification or Assistance to Complete Evaluation  No modification of tasks or assist necessary to complete eval    OT Frequency 2x / week    OT Duration 12 weeks    OT Treatment/Interventions Self-care/ADL training;Cryotherapy;Therapeutic exercise;DME and/or AE instruction;Balance training;Neuromuscular education;Manual Therapy;Splinting;Moist Heat;Passive range of motion;Therapeutic activities;Patient/family education    Plan continue to progress ROM of digits, wrists  and shoulders as it pertains to completion of ADL tasks that pt values.    Consulted and Agree with Plan of Care Patient             Patient will benefit from skilled therapeutic intervention in order to improve the following deficits and impairments:   Body Structure / Function / Physical Skills: ADL, Continence, Dexterity, Flexibility, Strength, ROM, Balance, Coordination, FMC, IADL, Endurance, UE functional use, Decreased knowledge of use of DME, GMC   Psychosocial Skills: Environmental  Adaptations, Habits, Routines and Behaviors   Visit Diagnosis: Muscle weakness (generalized)  Other lack of coordination    Problem List Patient Active Problem List   Diagnosis Date Noted   Acute appendicitis with appendiceal abscess 05/30/2018   Hypocalcemia 02/15/2018   Dysuria 02/15/2018   Bilateral lower extremity edema 02/11/2018   Vaginal yeast infection 01/30/2018   Chest tightness 01/27/2018   Healthcare-associated pneumonia 01/25/2018   Pleural effusion, not elsewhere classified 01/25/2018   Acute deep vein thrombosis (DVT) of left lower extremity ([place]) 01/24/2018   Acute deep vein thrombosis (DVT) of right lower extremity ([place]) 01/24/2018   Chronic allergic rhinitis 01/24/2018   Depression with anxiety 01/24/2018   UTI due to Klebsiella species 01/24/2018   Protein-calorie malnutrition, severe ([place]) 01/24/2018   S/P insertion of IVC (inferior vena caval) filter 01/24/2018   Reactive depression    Hypokalemia    Leukocytosis    Essential hypertension    Trauma    Tetraparesis ([facility])    Neuropathic pain    Neurogenic bowel    Neurogenic bladder    Benign essential HTN    Acute blood loss anemia    Central cord syndrome at C6 level of cervical spinal cord ([place]) 11/29/2017   Central cord syndrome ([place]) 11/29/2017    Harrel Carina, MS, OTR/L 09/03/2020, 11:57 AM  [place] [address] [place], [place],  [address] Phone: [phone]   Fax:  [phone]  Name: Sherry [name] MRN: [id number] Date of Birth: Jun 08, 1934

## 2020-09-03 NOTE — Therapy (Signed)
[place] MAIN [facility] SERVICES [address] [place], [place], [address] Phone: [phone]   Fax:  [phone]  Physical Therapy Treatment  Patient Details  Name: Sherry [name] MRN: [id number] Date of Birth: [birth year] No data recorded  Encounter Date: 09/03/2020   PT End of Session - 09/03/20 1150     Visit Number 84    Number of Visits 104    Date for PT Re-Evaluation 09/24/20    Authorization Type [employer]are FOTO performed by PT on eval (7/20), score 12, Progress note on 03/10/2020; PN on 08/20/2020    Authorization Time Period Recert [id number]; PN on 07/07/2020    PT Start Time 1145    PT Stop Time 1220    PT Time Calculation (min) 35 min    Equipment Utilized During Treatment Gait belt    Activity Tolerance Patient tolerated treatment well;Patient limited by fatigue    Behavior During Therapy WFL for tasks assessed/performed             Past Medical History:  Diagnosis Date   Acute blood loss anemia    Arthritis    Cancer ([place])    skin   Central cord syndrome at C6 level of cervical spinal cord ([place]) 11/29/2017   Hypertension    Protein-calorie malnutrition, severe ([place]) 01/24/2018   S/P insertion of IVC (inferior vena caval) filter 01/24/2018   Tetraparesis ([place])     Past Surgical History:  Procedure Laterality Date   ANTERIOR CERVICAL DECOMP/DISCECTOMY FUSION N/A 11/29/2017   Procedure: Cervical five-six, six-seven Anterior Cervical Decompression Fusion;  Surgeon: Judith Part, MD;  Location: [place];  Service: Neurosurgery;  Laterality: N/A;  Cervical five-six, six-seven Anterior Cervical Decompression Fusion   CATARACT EXTRACTION     IR IVC FILTER PLMT / S&I /IMG GUID/MOD SED  12/08/2017    There were no vitals filed for this visit.   Subjective Assessment - 09/03/20 1147     Subjective Patient reports doing well today and happy because today is her birthday.    Pertinent History Pt is an 86 yo  female that fell in 2019, fractured vertebrae in her neck and in her low back per family. 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 After hospital stay, pt discharged to [facility] for ~1 month, experienced severe UTI as well as bilateral DVT (IVC filter placed). Discharged to [facility], stayed for about a year, and then moved to [facility] in April 2021. Was receiving PT, but per family facility reported that did not have adequate equipment to maximize PT for her. Pt until about 1 month ago was practicing sit to stand transfers with 1-2 people, and working on static standing. Has a brace for L foot due to PF. Pt currently needs assistance with all ADLs (able to assist with donning/doffing her shirt), bed baths, and needs a hoyer lift for transfers. Able to drive power wheelchair without assistance.    Limitations Standing;Walking;House hold activities;Lifting    How long can you sit comfortably? NA    How long can you stand comfortably? unable    How long can you walk comfortably? unable    Patient Stated Goals "to stand up and walk"    Currently in Pain? No/denies             Interventions:  Therapeutic activities:   Stand pivot transfer training- Patient able to position feet in approx 45 deg angle to prepare for transfer without VC today.  Patient able to stand with Mod A  (holding onto PT's arms) and able to pivot using right LE with VC.   Patient performed 3 trials of static standing- using B platform walker  1) 1 min 53 sec with patient stopping - Complaining of some discomfort in left knee.  2) 3 min 03 sec - min assist with trunk control- able to perform some dynamic lateral weight shift.  3) 7 min 07 sec- Increased (min assist to maintain trunk control for last 25 sec).    Clinical Impression: Patient Exhibited improved standing posture with CGA- Min assist and verbal cues. She demo her best overall standing session today as seen by ability to standing 7 min with CGA  to min A and exhibiting improved overall hip ext and knee ext.   Patient will benefit from continued PT services to increase mobility and strength to improve patient's quality of life.                               PT Education - 09/03/20 1149     Education Details Exercise technique/Posture  technique    Person(s) Educated Patient    Methods Explanation;Demonstration;Tactile cues;Verbal cues    Comprehension Verbalized understanding;Returned demonstration;Verbal cues required;Tactile cues required;Need further instruction              PT Short Term Goals - 03/10/20 2211       PT SHORT TERM GOAL #1   Title The patient will perform initial HEP with minimum assistance in order to improve strength and function.    Baseline Patient demonstrating independence with initial HEP as of 03/10/2020 with no questions or difficulty.    Time 4    Period Weeks    Status Achieved    Target Date 02/11/20               PT Long Term Goals - 08/20/20 0706       PT LONG TERM GOAL #1   Title The patient will be compliant with finalized HEP with minimum assistance in preparation for self management and maintenance of condition.    Baseline 03/10/2020- Patient familiar with initial HEP and will keep goal active as HEP becomes more progressive including possible transfers and standing activities. 04/07/2020- Patient continues to report compliance with LE strengthening home exercises without questions or concerns. 04/21/2020- Patient able to verbalize and demonstrate good understanding of current HEP including some supine and seated LE strengthening exercises. Reviewed and patient performed 10 reps today with minimal cueing. Will keep goal active to progress HEP as appropriate. 05/28/2020- patient reports continues to perform LE strengthening as able and some help from caregiver as able. No changes at this time to home program as patient is limited to supine/seated therex.  07/02/2020- Patient continues to report compliance with current supine and seated LE strengtheing home program and states no questions or concerns regarding her plan.    Time 12    Period Weeks    Status Achieved      PT LONG TERM GOAL #2   Title The patient will demonstrate at least 10 point improvement on FOTO score indicating an improved ability to perform functional activities.    Baseline on eval (/720) score 12; 10/22/19: 12; 12/10/19: 18, 12/13: 18: 04/07/2020- Will assess next visit. 07/02/2020- Will obtain next visit.    Time 12    Period Weeks    Status On-going    Target  Date 09/24/20      PT LONG TERM GOAL #3   Title The patient will demonstrate lateral scooting  for assistance with transfers with minA to maximize independence.    Baseline 10/22/19: Pt requires modA+1 for lateral scooting; 12/10/19: modA+1, 12/13: modA-maxA, 12/27: modA-maxA, 03/10/2020- ModA-MaxA- increased verbal cues and visual demonstration. 04/07/2020- Continued Max assist to perform forward/lateral scoot. 04/21/2020- Patient was able to demo slight lateral scoot with CGA approx 12 in then fatigued requiring max assist- she is able to scoot forward by leaning without significant issues. 05/28/2020- While sitting in chair- patient able to perform forward and lateral scoot with VC/visual demo and increased time allowed- CGA today but not consistent yet- will keep goal active at this time to continue to focus on strengthening and improving technique. 07/02/2020- Patient able to demonstrate minimal ability to laterally shift weigh on mat table requiring max assist yet is able to forward scoot out to edge of mat table with min Assist. 08/20/2020 - Patient was able to demonstrate minimal lateral scooting at edge of mat while holding feet still so they did not slip on tile surface. She continues to be able to demonstrate scoot forward and uses tilt option to recline to position safely back into seat of power wheelchair.    Time 12     Period Weeks    Status On-going    Target Date 09/24/20      PT LONG TERM GOAL #4   Title The patient will demonstrate a squat pivot transfer with minimum assistance to maximize independence and mobility.    Baseline 10/22/19: unable to safely attempt at this time; 12/10/19: unable to safely attempt at this time, 01/14/20: unable to safety attempt at this time. 03/10/2020- Patient able to perform Stand pivot transfer with Maximal assist. 04/07/2020- Patient continues to require Max assist with sit to stand- will assist SPT next visit. 04/21/2020- Patient presents with Max assist to perform Sit to stand Transfer- unable to pivot or move Left LE well without difficulty. 07/02/2020= Max assist with SPT and minimal ability to pivot. 7/202/2022-Patient able to stand with minimal assist this visit (as good as CGA last visit) and able to move right foot to pivot and performing with moderate assist today.  ?    Time 12    Period Weeks    Status On-going    Target Date 09/24/20      PT LONG TERM GOAL #5   Title The patient will demonstrate sitting without UE support for 2-5 minutes at EOB to improve participation and maximize independence with ADLs.    Baseline 10/22/19: Pt able to sit unsupported for at least 2 minutes at edge of bed. 04/21/2020- Patient continues to demo good sitting at edge of mat > 5 miin without difficulty or back support.    Time 12    Period Weeks    Status Achieved      PT LONG TERM GOAL #6   Title Patient will demonstrate improved functional LE strength as seen by consistent ability to stand > 2 min (3 out of 4 trials) with Mod/Max A for improved LE strength with transfers.    Baseline 05/28/2020- Patient able to inconsistent stand 1-2 min right now in //bars with Mod/Max A. 07/02/2020- Patient was able to progress last 2 visit to standing using bilateral platform attachment between 1-2 min which is a progression from the parallel bars. 08/20/2020- Patient performed static standing with min-  mod A using bilateral platform attachment on walker  for 6 min 30 sec today with assist for trunk control and intermittent verbal cues for posture.    Time 12    Period Weeks    Status On-going    Target Date 09/24/20                   Plan - 09/03/20 1303     Clinical Impression Statement Patient Exhibited improved standing posture with CGA- Min assist and verbal cues. She demo her best overall standing session today as seen by ability to standing 7 min with CGA to min A and exhibiting improved overall hip ext and knee ext.   Patient will benefit from continued PT services to increase mobility and strength to improve patient's quality of life.    Personal Factors and Comorbidities Age;Time since onset of injury/illness/exacerbation;Comorbidity 3+;Fitness    Comorbidities HTN, quadriparesis, history of DVT, neurogenic bladder    Examination-Activity Limitations Bathing;Hygiene/Grooming;Squat;Bed Mobility;Lift;Bend;Stand;Engineer, manufacturing;Toileting;Self Feeding;Transfers;Continence;Sit;Dressing;Sleep;Carry    Examination-Participation Restrictions Church;Laundry;Cleaning;Medication Management;Community Activity;Meal Prep;Interpersonal Relationship    Stability/Clinical Decision Making Evolving/Moderate complexity    Rehab Potential Fair    PT Frequency 2x / week    PT Duration 12 weeks    PT Treatment/Interventions ADLs/Self Care Home Management;Electrical Stimulation;Therapeutic activities;Wheelchair mobility training;Vasopneumatic Device;Joint Manipulations;Vestibular;Passive range of motion;Patient/family education;Therapeutic exercise;DME Instruction;Biofeedback;Aquatic Therapy;Moist Heat;Gait training;Balance training;Orthotic Fit/Training;Dry needling;Energy conservation;Taping;Splinting;Neuromuscular re-education;Cryotherapy;Ultrasound;Functional mobility training    PT Next Visit Plan Continue with progressive Standing/transfer training.    PT Home Exercise Plan no  changes    Consulted and Agree with Plan of Care Patient             Patient will benefit from skilled therapeutic intervention in order to improve the following deficits and impairments:  Abnormal gait, Decreased balance, Decreased endurance, Decreased mobility, Difficulty walking, Hypomobility, Impaired sensation, Decreased range of motion, Improper body mechanics, Impaired perceived functional ability, Decreased activity tolerance, Decreased knowledge of use of DME, Decreased safety awareness, Decreased strength, Impaired flexibility, Impaired UE functional use, Postural dysfunction  Visit Diagnosis: Abnormality of gait and mobility  Difficulty in walking, not elsewhere classified  Muscle weakness (generalized)     Problem List Patient Active Problem List   Diagnosis Date Noted   Acute appendicitis with appendiceal abscess 05/30/2018   Hypocalcemia 02/15/2018   Dysuria 02/15/2018   Bilateral lower extremity edema 02/11/2018   Vaginal yeast infection 01/30/2018   Chest tightness 01/27/2018   Healthcare-associated pneumonia 01/25/2018   Pleural effusion, not elsewhere classified 01/25/2018   Acute deep vein thrombosis (DVT) of left lower extremity ([place]) 01/24/2018   Acute deep vein thrombosis (DVT) of right lower extremity ([place]) 01/24/2018   Chronic allergic rhinitis 01/24/2018   Depression with anxiety 01/24/2018   UTI due to Klebsiella species 01/24/2018   Protein-calorie malnutrition, severe ([place]) 01/24/2018   S/P insertion of IVC (inferior vena caval) filter 01/24/2018   Reactive depression    Hypokalemia    Leukocytosis    Essential hypertension    Trauma    Tetraparesis (HCC)    Neuropathic pain    Neurogenic bowel    Neurogenic bladder    Benign essential HTN    Acute blood loss anemia    Central cord syndrome at C6 level of cervical spinal cord ([place]) 11/29/2017   Central cord syndrome ([place]) 11/29/2017    Lewis Moccasin, PT 09/03/2020, 1:17  PM  [place] MAIN [facility] SERVICES [address] [place], [place], [address] Phone: [phone]   Fax:  [phone]  Name: Eisele  ALTAMAE LIPKA MRN: [id number] Date of Birth: 1934/07/19

## 2020-09-08 ENCOUNTER — Other Ambulatory Visit: Payer: Self-pay

## 2020-09-08 ENCOUNTER — Ambulatory Visit: Payer: Medicare HMO

## 2020-09-08 ENCOUNTER — Ambulatory Visit: Payer: Medicare HMO | Admitting: Occupational Therapy

## 2020-09-08 DIAGNOSIS — R262 Difficulty in walking, not elsewhere classified: Secondary | ICD-10-CM

## 2020-09-08 DIAGNOSIS — M6281 Muscle weakness (generalized): Secondary | ICD-10-CM

## 2020-09-08 DIAGNOSIS — R278 Other lack of coordination: Secondary | ICD-10-CM

## 2020-09-08 DIAGNOSIS — R2681 Unsteadiness on feet: Secondary | ICD-10-CM

## 2020-09-08 DIAGNOSIS — R269 Unspecified abnormalities of gait and mobility: Secondary | ICD-10-CM

## 2020-09-08 NOTE — Therapy (Signed)
[place] MAIN [facility] SERVICES [address] [place], [place], [address] Phone: [phone]   Fax:  [phone]  Occupational Therapy Progress Note  Dates of reporting period  07/30/20   to   09/08/20   Patient Details  Name: NENE ARANAS MRN: [id number] Date of Birth: [birth year] No data recorded  Encounter Date: 09/08/2020   OT End of Session - 09/08/20 1050     Visit Number 60    Number of Visits 49    Date for OT Re-Evaluation 10/22/20    Authorization Time Period Progress report period starting 09/08/2020    OT Start Time 1055    OT Stop Time 1140    OT Time Calculation (min) 45 min    Equipment Utilized During Treatment moist heat    Activity Tolerance Patient tolerated treatment well    Behavior During Therapy [facility] for tasks assessed/performed             Past Medical History:  Diagnosis Date   Acute blood loss anemia    Arthritis    Cancer ([place])    skin   Central cord syndrome at C6 level of cervical spinal cord ([place]) 11/29/2017   Hypertension    Protein-calorie malnutrition, severe ([place]) 01/24/2018   S/P insertion of IVC (inferior vena caval) filter 01/24/2018   Tetraparesis ([place])     Past Surgical History:  Procedure Laterality Date   ANTERIOR CERVICAL DECOMP/DISCECTOMY FUSION N/A 11/29/2017   Procedure: Cervical five-six, six-seven Anterior Cervical Decompression Fusion;  Surgeon: Judith Part, MD;  Location: [place];  Service: Neurosurgery;  Laterality: N/A;  Cervical five-six, six-seven Anterior Cervical Decompression Fusion   CATARACT EXTRACTION     IR IVC FILTER PLMT / S&I /IMG GUID/MOD SED  12/08/2017    There were no vitals filed for this visit.   Subjective Assessment - 09/08/20 1049     Subjective  Pt reports enjoying her birthday last week    Patient is accompanied by: Family member    Pertinent History Patient s/p fall November 29, 2017 resulting in diagnosis of central cord syndrome at C 6 level.   She has had therapy in multiple venues but with recent move to [facility], therapy staff recommended she seek outpatient therapy for her needs.    Patient Stated Goals Patient would like to be able to move better in bed, stand and perform self care tasks.    Currently in Pain? No/denies    Pain Onset 1 to 4 weeks ago                [facility] OT Assessment - 09/08/20 0001       AROM   Right Shoulder Flexion 100 Degrees    Right Shoulder ABduction 95 Degrees    Left Shoulder Flexion 125 Degrees    Left Shoulder ABduction 100 Degrees    Right Elbow Flexion 155    Right Elbow Extension 0    Left Elbow Flexion 155    Left Elbow Extension 0    Right Wrist Extension 50 Degrees    Right Wrist Flexion 50 Degrees    Left Wrist Extension -20 Degrees    Left Wrist Flexion 70 Degrees      Right Hand PROM   R Index  MCP 0-90 75 Degrees    R Long  MCP 0-90 90 Degrees    R Ring  MCP 0-90 75 Degrees    R Little  MCP 0-90 70 Degrees  Left Hand PROM   L Index  MCP 0-90 45 Degrees    L Long  MCP 0-90 25 Degrees    L Ring  MCP 0-90 20 Degrees    L Little  MCP 0-90 15 Degrees      Hand Function   Right Hand Lateral Pinch 8 lbs    Left Hand Lateral Pinch 5 lbs             Therapeutic Exercise Pt tolerated seated AROM in all joint ranges of the BUE and hand including shoulder flexion, abduction, horizontal abduction, elbow flexion, extension, forearm supination, wrist flexion, extension, and digit MP flexion, and extension. Pt tolerated moist heat prior to and in preparation for therex.    Measurements were obtained and goals were reviewed with the patient. Pt is improving with [facility] for handwriting/texting, continues to demonstrate deficits in speed and accuracy. Pt has progressed with RUE shoulder abduction from 85* to 95* and R wrist extension from 40* to 50*. Pt continues to need OT services to work on improving B [facility] and strength in order to improve engagement of the BUE during  basic ADL, and IADL tasks.                   OT Education - 09/08/20 1050     Education Details BUE ROM with functional reaching, goals    Person(s) Educated Patient;Caregiver(s)    Methods Explanation;Demonstration    Comprehension Verbalized understanding;Need further instruction                 OT Long Term Goals - 09/08/20 1100       OT LONG TERM GOAL #1   Title Patient and caregiver will demonstrate understanding of home exercise program for ROM.    Baseline Pt. continues to have a restorative aide rehab aide assist her wih exercises at [facility]. 8/8: Independently completes/directs HEP mulitple times/week    Time 12    Period Weeks    Status Partially Met    Target Date 10/22/20      OT LONG TERM GOAL #4   Title Patient will demonstrate improved composite finger flexion to hold deodorant to apply to underarms.    Baseline Pt. continues to present with improving finger flexion of R hand for a partial gross grasp, but unable to grasp and hold items. pt. conitnues to present with limited digit flexion of L hand. Pt. has active left thumb abduction. 8/8: holds cup with 2 hands, continues to have limited composite finger flexion L worse than R    Time 12    Period Weeks    Status On-going    Target Date 10/22/20      OT LONG TERM GOAL #6   Title Patient will increase right UE ROM to comb the back of her hair with modified independence.    Baseline 07/30/2020: Pt. is now able to comb the back of her hair with a pic, however continues to work on completing the task efficiently. 8/8: Achieves with great effort, has assistance for efficiancy    Time 12    Period Weeks    Status Partially Met      OT LONG TERM GOAL #7   Title Pt. will independently, and efficiently send text messages on her phone.    Baseline 07/30/2020: Pt. continues to present with limited BUE strength, and [facility] skills. 8/8: independtly drafts text messages, continues to work on accuracy and  efficiancy 2/2 limited [facility]  Time 12    Period Weeks    Status Partially Met    Target Date 10/22/20      OT LONG TERM GOAL #8   Title Pt. will write, and sign her name with 100% legibility, and modified independence.    Baseline Pt. continues to  consistently filling out her daily menu, and writing for crossword puzzles. Pt. is able to maintain grasp on a wide width pen. [place] to work on [employer]. 8/8: writes daily, continues to work on improving speed    Time 12    Period Weeks    Status Partially Met    Target Date 10/22/20                   Plan - 09/08/20 1051     Clinical Impression Statement Measurements were obtained and goals were reviewed with the patient. Pt is improving with [facility] for handwriting/texting, continues to demonstrate deficits in speed and accuracy. Pt has progressed with RUE shoulder abduction from 85* to 95* and R wrist extension from 40* to 50*. Pt continues to need OT services to work on improving B [facility] and strength in order to improve engagement of the BUE during basic ADL, and IADL tasks.       OT Occupational Profile and History Detailed Assessment- Review of Records and additional review of physical, cognitive, psychosocial history related to current functional performance    Occupational performance deficits (Please refer to evaluation for details): ADL's;IADL's;Leisure;Social Participation    Body Structure / Function / Physical Skills ADL;Continence;Dexterity;Flexibility;Strength;ROM;Balance;Coordination;FMC;IADL;Endurance;UE functional use;Decreased knowledge of use of DME;GMC    Psychosocial Skills Environmental  Adaptations;Habits;Routines and Behaviors    Rehab Potential Fair    Clinical Decision Making Several treatment options, min-mod task modification necessary    Comorbidities Affecting Occupational Performance: Presence of comorbidities impacting occupational performance    Comorbidities impacting occupational performance  description: contractures of bilateral hands, dependent transfers,    Modification or Assistance to Complete Evaluation  No modification of tasks or assist necessary to complete eval    OT Frequency 2x / week    OT Duration 12 weeks    OT Treatment/Interventions Self-care/ADL training;Cryotherapy;Therapeutic exercise;DME and/or AE instruction;Balance training;Neuromuscular education;Manual Therapy;Splinting;Moist Heat;Passive range of motion;Therapeutic activities;Patient/family education    Consulted and Agree with Plan of Care Patient             Patient will benefit from skilled therapeutic intervention in order to improve the following deficits and impairments:   Body Structure / Function / Physical Skills: ADL, Continence, Dexterity, Flexibility, Strength, ROM, Balance, Coordination, FMC, IADL, Endurance, UE functional use, Decreased knowledge of use of DME, GMC   Psychosocial Skills: Environmental  Adaptations, Habits, Routines and Behaviors   Visit Diagnosis: Other lack of coordination    Problem List Patient Active Problem List   Diagnosis Date Noted   Acute appendicitis with appendiceal abscess 05/30/2018   Hypocalcemia 02/15/2018   Dysuria 02/15/2018   Bilateral lower extremity edema 02/11/2018   Vaginal yeast infection 01/30/2018   Chest tightness 01/27/2018   Healthcare-associated pneumonia 01/25/2018   Pleural effusion, not elsewhere classified 01/25/2018   Acute deep vein thrombosis (DVT) of left lower extremity ([place]) 01/24/2018   Acute deep vein thrombosis (DVT) of right lower extremity ([place]) 01/24/2018   Chronic allergic rhinitis 01/24/2018   Depression with anxiety 01/24/2018   UTI due to Klebsiella species 01/24/2018   Protein-calorie malnutrition, severe ([place]) 01/24/2018   S/P insertion of IVC (inferior vena caval) filter 01/24/2018  Reactive depression    Hypokalemia    Leukocytosis    Essential hypertension    Trauma    Tetraparesis (HCC)     Neuropathic pain    Neurogenic bowel    Neurogenic bladder    Benign essential HTN    Acute blood loss anemia    Central cord syndrome at C6 level of cervical spinal cord ([place]) 11/29/2017   Central cord syndrome (HCC) 11/29/2017   Dessie Coma, M.S. OTR/L  09/08/20, 12:58 PM  ascom [phone]  [place] MAIN [facility] SERVICES [address] [place], [place], [address] Phone: [phone]   Fax:  [phone]  Name: LEOTTA WEINGARTEN MRN: [id number] Date of Birth: 1934-11-08

## 2020-09-08 NOTE — Therapy (Signed)
[name] [facility] SERVICES [address] [place], [place], [address] Phone: [phone]   Fax:  [phone]  Physical Therapy Treatment  Patient Details  Name: Sherry [name] MRN: [id number] Date of Birth: May 30, 1934 No data recorded  Encounter Date: 09/08/2020   PT End of Session - 09/08/20 1305     Visit Number 95    Number of Visits 104    Date for PT Re-Evaluation 09/24/20    Authorization Type a[employer]na medicare FOTO performed by PT on eval (7/20), score 12, Progress note on 03/10/2020; PN on 08/20/2020    Authorization Time Period Recert [id number]; PN on 07/07/2020    PT Start Time 1146    PT Stop Time 1226    PT Time Calculation (min) 40 min    Equipment Utilized During Treatment Gait belt    Activity Tolerance Patient tolerated treatment well;Patient limited by fatigue    Behavior During Therapy WFL for tasks assessed/performed             Past Medical History:  Diagnosis Date   Acute blood loss anemia    Arthritis    Cancer ([place])    skin   Central cord syndrome at C6 level of cervical spinal cord ([place]) 11/29/2017   Hypertension    Protein-calorie malnutrition, severe ([place]) 01/24/2018   S/P insertion of IVC (inferior vena caval) filter 01/24/2018   Tetraparesis ([place])     Past Surgical History:  Procedure Laterality Date   ANTERIOR CERVICAL DECOMP/DISCECTOMY FUSION N/A 11/29/2017   Procedure: Cervical five-six, six-seven Anterior Cervical Decompression Fusion;  Surgeon: Judith Part, MD;  Location: [facility];  Service: Neurosurgery;  Laterality: N/A;  Cervical five-six, six-seven Anterior Cervical Decompression Fusion   CATARACT EXTRACTION     IR IVC FILTER PLMT / S&I /IMG GUID/MOD SED  12/08/2017    There were no vitals filed for this visit.   Subjective Assessment - 09/08/20 1148     Subjective Patient denies any new complaints. States she had a really good birthday and ready to work today.    Pertinent  History Pt is an 86 yo female that fell in 2019, fractured vertebrae in her neck and in her low back per family. 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 After hospital stay, pt discharged to [facility] for ~1 month, experienced severe UTI as well as bilateral DVT (IVC filter placed). Discharged to [facility], stayed for about a year, and then moved to [facility] in April 2021. Was receiving PT, but per family facility reported that did not have adequate equipment to maximize PT for her. Pt until about 1 month ago was practicing sit to stand transfers with 1-2 people, and working on static standing. Has a brace for L foot due to PF. Pt currently needs assistance with all ADLs (able to assist with donning/doffing her shirt), bed baths, and needs a hoyer lift for transfers. Able to drive power wheelchair without assistance.    Limitations Standing;Walking;House hold activities;Lifting    How long can you sit comfortably? NA    How long can you stand comfortably? unable    How long can you walk comfortably? unable    Patient Stated Goals "to stand up and walk"    Currently in Pain? No/denies              Interventions:  Therapeutic exercises:  Seated kicks using soccer ball x 2 min x 3 trials Ball toss and catch using rainbow  colored ball x 2 min x 2 trials   Therapeutic activities:  Transfers: Stand pivot transfer training- Patient able to position feet in approx 45 deg angle to prepare for transfer without VC today.  Patient able to stand with Mod A  (holding onto PT's arms) and able to pivot using right LE with VC. Patient was able to lift Right LE well today without assist.    Pregait standing:    Patient performed 4 trials of  dynamic standing- using B platform walker and utilized a Knee Immobilizer for 3 out of 4 trials.   1) Dynamic lateral Weight shifting- with difficulty shifting over to right side and unable to lift Left LE.  2) Using KI on right LE- Patient was  able to shift better and was even able to flex Left knee. 3) Patient able to shift over to right side with use of KI and able to add some weight on right side and was able to slightly move Left LE.  4) attempted to move Left LE but patient actually kicked her left LE out in front and unable to pull it back. She was assisted back to mat with Max assist.   Clinical Impression: Patient exhibited ability to pivot better - able to move his right LE well without significant difficulty. She was also able to practice dynamic lateral weight shifting and performed better with use of right KI - able to move her Left LE for the first time in standing. Patient will benefit from continued PT services to increase mobility and strength to improve patient's quality of life.                       PT Education - 09/08/20 1148     Education Details Standing posture and exercise/transfer technique    Person(s) Educated Patient    Methods Explanation;Demonstration;Tactile cues;Verbal cues    Comprehension Verbalized understanding;Returned demonstration;Verbal cues required;Tactile cues required;Need further instruction              PT Short Term Goals - 03/10/20 2211       PT SHORT TERM GOAL #1   Title The patient will perform initial HEP with minimum assistance in order to improve strength and function.    Baseline Patient demonstrating independence with initial HEP as of 03/10/2020 with no questions or difficulty.    Time 4    Period Weeks    Status Achieved    Target Date 02/11/20               PT Long Term Goals - 08/20/20 0706       PT LONG TERM GOAL #1   Title The patient will be compliant with finalized HEP with minimum assistance in preparation for self management and maintenance of condition.    Baseline 03/10/2020- Patient familiar with initial HEP and will keep goal active as HEP becomes more progressive including possible transfers and standing activities.  04/07/2020- Patient continues to report compliance with LE strengthening home exercises without questions or concerns. 04/21/2020- Patient able to verbalize and demonstrate good understanding of current HEP including some supine and seated LE strengthening exercises. Reviewed and patient performed 10 reps today with minimal cueing. Will keep goal active to progress HEP as appropriate. 05/28/2020- patient reports continues to perform LE strengthening as able and some help from caregiver as able. No changes at this time to home program as patient is limited to supine/seated therex. 07/02/2020- Patient continues to report compliance  with current supine and seated LE strengtheing home program and states no questions or concerns regarding her plan.    Time 12    Period Weeks    Status Achieved      PT LONG TERM GOAL #2   Title The patient will demonstrate at least 10 point improvement on FOTO score indicating an improved ability to perform functional activities.    Baseline on eval (/720) score 12; 10/22/19: 12; 12/10/19: 18, 12/13: 18: 04/07/2020- Will assess next visit. 07/02/2020- Will obtain next visit.    Time 12    Period Weeks    Status On-going    Target Date 09/24/20      PT LONG TERM GOAL #3   Title The patient will demonstrate lateral scooting  for assistance with transfers with minA to maximize independence.    Baseline 10/22/19: Pt requires modA+1 for lateral scooting; 12/10/19: modA+1, 12/13: modA-maxA, 12/27: modA-maxA, 03/10/2020- ModA-MaxA- increased verbal cues and visual demonstration. 04/07/2020- Continued Max assist to perform forward/lateral scoot. 04/21/2020- Patient was able to demo slight lateral scoot with CGA approx 12 in then fatigued requiring max assist- she is able to scoot forward by leaning without significant issues. 05/28/2020- While sitting in chair- patient able to perform forward and lateral scoot with VC/visual demo and increased time allowed- CGA today but not consistent yet- will  keep goal active at this time to continue to focus on strengthening and improving technique. 07/02/2020- Patient able to demonstrate minimal ability to laterally shift weigh on mat table requiring max assist yet is able to forward scoot out to edge of mat table with min Assist. 08/20/2020 - Patient was able to demonstrate minimal lateral scooting at edge of mat while holding feet still so they did not slip on tile surface. She continues to be able to demonstrate scoot forward and uses tilt option to recline to position safely back into seat of power wheelchair.    Time 12    Period Weeks    Status On-going    Target Date 09/24/20      PT LONG TERM GOAL #4   Title The patient will demonstrate a squat pivot transfer with minimum assistance to maximize independence and mobility.    Baseline 10/22/19: unable to safely attempt at this time; 12/10/19: unable to safely attempt at this time, 01/14/20: unable to safety attempt at this time. 03/10/2020- Patient able to perform Stand pivot transfer with Maximal assist. 04/07/2020- Patient continues to require Max assist with sit to stand- will assist SPT next visit. 04/21/2020- Patient presents with Max assist to perform Sit to stand Transfer- unable to pivot or move Left LE well without difficulty. 07/02/2020= Max assist with SPT and minimal ability to pivot. 7/202/2022-Patient able to stand with minimal assist this visit (as good as CGA last visit) and able to move right foot to pivot and performing with moderate assist today.  ?    Time 12    Period Weeks    Status On-going    Target Date 09/24/20      PT LONG TERM GOAL #5   Title The patient will demonstrate sitting without UE support for 2-5 minutes at EOB to improve participation and maximize independence with ADLs.    Baseline 10/22/19: Pt able to sit unsupported for at least 2 minutes at edge of bed. 04/21/2020- Patient continues to demo good sitting at edge of mat > 5 miin without difficulty or back support.     Time 12    Period  Weeks    Status Achieved      PT LONG TERM GOAL #6   Title Patient will demonstrate improved functional LE strength as seen by consistent ability to stand > 2 min (3 out of 4 trials) with Mod/Max A for improved LE strength with transfers.    Baseline 05/28/2020- Patient able to inconsistent stand 1-2 min right now in //bars with Mod/Max A. 07/02/2020- Patient was able to progress last 2 visit to standing using bilateral platform attachment between 1-2 min which is a progression from the parallel bars. 08/20/2020- Patient performed static standing with min- mod A using bilateral platform attachment on walker for 6 min 30 sec today with assist for trunk control and intermittent verbal cues for posture.    Time 12    Period Weeks    Status On-going    Target Date 09/24/20                   Plan - 09/08/20 1305     Clinical Impression Statement Patient exhibited ability to pivot better - able to move his right LE well without significant difficulty. She was also able to practice dynamic lateral weight shifting and performed better with use of right KI - able to move her Left LE for the first time in standing. Patient will benefit from continued PT services to increase mobility and strength to improve patient's quality of life.    Personal Factors and Comorbidities Age;Time since onset of injury/illness/exacerbation;Comorbidity 3+;Fitness    Comorbidities HTN, quadriparesis, history of DVT, neurogenic bladder    Examination-Activity Limitations Bathing;Hygiene/Grooming;Squat;Bed Mobility;Lift;Bend;Stand;Engineer, manufacturing;Toileting;Self Feeding;Transfers;Continence;Sit;Dressing;Sleep;Carry    Examination-Participation Restrictions Church;Laundry;Cleaning;Medication Management;Community Activity;Meal Prep;Interpersonal Relationship    Stability/Clinical Decision Making Evolving/Moderate complexity    Rehab Potential Fair    PT Frequency 2x / week    PT Duration  12 weeks    PT Treatment/Interventions ADLs/Self Care Home Management;Electrical Stimulation;Therapeutic activities;Wheelchair mobility training;Vasopneumatic Device;Joint Manipulations;Vestibular;Passive range of motion;Patient/family education;Therapeutic exercise;DME Instruction;Biofeedback;Aquatic Therapy;Moist Heat;Gait training;Balance training;Orthotic Fit/Training;Dry needling;Energy conservation;Taping;Splinting;Neuromuscular re-education;Cryotherapy;Ultrasound;Functional mobility training    PT Next Visit Plan Continue with progressive Standing/transfer training.    PT Home Exercise Plan no changes    Consulted and Agree with Plan of Care Patient             Patient will benefit from skilled therapeutic intervention in order to improve the following deficits and impairments:  Abnormal gait, Decreased balance, Decreased endurance, Decreased mobility, Difficulty walking, Hypomobility, Impaired sensation, Decreased range of motion, Improper body mechanics, Impaired perceived functional ability, Decreased activity tolerance, Decreased knowledge of use of DME, Decreased safety awareness, Decreased strength, Impaired flexibility, Impaired UE functional use, Postural dysfunction  Visit Diagnosis: Abnormality of gait and mobility  Difficulty in walking, not elsewhere classified  Muscle weakness (generalized)  Unsteadiness on feet     Problem List Patient Active Problem List   Diagnosis Date Noted   Acute appendicitis with appendiceal abscess 05/30/2018   Hypocalcemia 02/15/2018   Dysuria 02/15/2018   Bilateral lower extremity edema 02/11/2018   Vaginal yeast infection 01/30/2018   Chest tightness 01/27/2018   Healthcare-associated pneumonia 01/25/2018   Pleural effusion, not elsewhere classified 01/25/2018   Acute deep vein thrombosis (DVT) of left lower extremity ([place]) 01/24/2018   Acute deep vein thrombosis (DVT) of right lower extremity ([place]) 01/24/2018   Chronic allergic  rhinitis 01/24/2018   Depression with anxiety 01/24/2018   UTI due to Klebsiella species 01/24/2018   Protein-calorie malnutrition, severe ([place]) 01/24/2018   S/P insertion of IVC (  inferior vena caval) filter 01/24/2018   Reactive depression    Hypokalemia    Leukocytosis    Essential hypertension    Trauma    Tetraparesis ([facility])    Neuropathic pain    Neurogenic bowel    Neurogenic bladder    Benign essential HTN    Acute blood loss anemia    Central cord syndrome at C6 level of cervical spinal cord ([name]) 11/29/2017   Central cord syndrome ([place]) 11/29/2017    Lewis Moccasin, PT 09/08/2020, 5:10 PM  [place] [facility] SERVICES [address] [place], [place], [address] Phone: [phone]   Fax:  [phone]  Name: Sherry [name] MRN: [id number] Date of Birth: 1934/08/17

## 2020-09-10 ENCOUNTER — Ambulatory Visit: Payer: Medicare HMO | Admitting: Occupational Therapy

## 2020-09-10 ENCOUNTER — Ambulatory Visit: Payer: Medicare HMO

## 2020-09-15 ENCOUNTER — Encounter: Payer: Self-pay | Admitting: Occupational Therapy

## 2020-09-15 ENCOUNTER — Ambulatory Visit: Payer: Medicare HMO | Admitting: Occupational Therapy

## 2020-09-15 ENCOUNTER — Ambulatory Visit: Payer: Medicare HMO

## 2020-09-15 ENCOUNTER — Other Ambulatory Visit: Payer: Self-pay

## 2020-09-15 DIAGNOSIS — M6281 Muscle weakness (generalized): Secondary | ICD-10-CM

## 2020-09-15 DIAGNOSIS — R262 Difficulty in walking, not elsewhere classified: Secondary | ICD-10-CM

## 2020-09-15 DIAGNOSIS — R2681 Unsteadiness on feet: Secondary | ICD-10-CM

## 2020-09-15 DIAGNOSIS — R269 Unspecified abnormalities of gait and mobility: Secondary | ICD-10-CM

## 2020-09-15 NOTE — Therapy (Signed)
[place] MAIN [facility] SERVICES [address] [place], [place], [address] Phone: [phone]   Fax:  [phone]  Physical Therapy Treatment  Patient Details  Name: Sherry [name] MRN: [id number] Date of Birth: 04/23/1934 No data recorded  Encounter Date: 09/15/2020   PT End of Session - 09/15/20 1151     Visit Number 88    Number of Visits 104    Date for PT Re-Evaluation 09/24/20    Authorization Type a[employer]na medicare FOTO performed by PT on eval (7/20), score 12, Progress note on 03/10/2020; PN on 08/20/2020    Authorization Time Period Recert [id number]; PN on 07/07/2020    PT Start Time 1145    PT Stop Time 1225    PT Time Calculation (min) 40 min    Equipment Utilized During Treatment Gait belt    Activity Tolerance Patient tolerated treatment well;Patient limited by fatigue    Behavior During Therapy WFL for tasks assessed/performed             Past Medical History:  Diagnosis Date   Acute blood loss anemia    Arthritis    Cancer ([place])    skin   Central cord syndrome at C6 level of cervical spinal cord ([place]) 11/29/2017   Hypertension    Protein-calorie malnutrition, severe ([place]) 01/24/2018   S/P insertion of IVC (inferior vena caval) filter 01/24/2018   Tetraparesis ([place])     Past Surgical History:  Procedure Laterality Date   ANTERIOR CERVICAL DECOMP/DISCECTOMY FUSION N/A 11/29/2017   Procedure: Cervical five-six, six-seven Anterior Cervical Decompression Fusion;  Surgeon: Judith Part, MD;  Location: [facility];  Service: Neurosurgery;  Laterality: N/A;  Cervical five-six, six-seven Anterior Cervical Decompression Fusion   CATARACT EXTRACTION     IR IVC FILTER PLMT / S&I /IMG GUID/MOD SED  12/08/2017    There were no vitals filed for this visit.   Subjective Assessment - 09/15/20 1148     Subjective Patient reports no new changes or complaints. States she has been working on a puzzle at home.    Pertinent  History Pt is an 86 yo female that fell in 2019, fractured vertebrae in her neck and in her low back per family. 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 After hospital stay, pt discharged to [facility] for ~1 month, experienced severe UTI as well as bilateral DVT (IVC filter placed). Discharged to [facility], stayed for about a year, and then moved to [facility] in April 2021. Was receiving PT, but per family facility reported that did not have adequate equipment to maximize PT for her. Pt until about 1 month ago was practicing sit to stand transfers with 1-2 people, and working on static standing. Has a brace for L foot due to PF. Pt currently needs assistance with all ADLs (able to assist with donning/doffing her shirt), bed baths, and needs a hoyer lift for transfers. Able to drive power wheelchair without assistance.    Limitations Standing;Walking;House hold activities;Lifting    How long can you sit comfortably? NA    How long can you stand comfortably? unable    How long can you walk comfortably? unable    Patient Stated Goals "to stand up and walk"    Currently in Pain? No/denies             Interventions:   Therapeutic exercises:   Seated knee ext (AAROM on right- patient able to achieve approx 3/4 motion prior to requiring  some assistance) BLE x 12 reps each.  Seated hip march (AAROM BLE) x 12 reps each. Patient required VC and physical assist to perform all exercises correctly today.   Neuromuscular re-education:   Patient performed sit to stand transfer with min physical assist  and use of gait belt in // Bars from her power chair.   Patient performed initial static standing with mod assist to position hips forward (for improved COM). Patient able to hold onto // bars with B UE with some weightbearing yet majority of weight bearing through BLE (knees hyperextended).   Lateral weight shift- Patient able to shift left to right fairly well with no knee buckling  approx 30 sec x trials.   Patient was able to take a small step with right LE in // bars and move back with Min assist at trunk. She attempted to move her left LE several times in clinic while standing today- able to swing lef forward but unable to extend leg back for erect standing.   Clinical Impression: Patient was able to demo good ability to stand with min assist and able perform some dynamic lateral weight shift. She was able to move each leg yet poor control of Left LE yet no right knee buckling today. She continues to lack sufficient strength to adequately try to walk yet and may benefit from use of Biodex unweighing system next week. She will also benefit from continued LE strengthening to promote improved standing and ability to transfer. Patient will benefit from continued PT services to increase mobility and strength to improve patient's quality of life.                          PT Education - 09/15/20 1149     Education Details Postural cues: body mechanics; exercise technique    Person(s) Educated Patient    Methods Explanation;Demonstration;Tactile cues;Verbal cues;Handout    Comprehension Verbalized understanding;Returned demonstration;Verbal cues required;Tactile cues required;Need further instruction              PT Short Term Goals - 03/10/20 2211       PT SHORT TERM GOAL #1   Title The patient will perform initial HEP with minimum assistance in order to improve strength and function.    Baseline Patient demonstrating independence with initial HEP as of 03/10/2020 with no questions or difficulty.    Time 4    Period Weeks    Status Achieved    Target Date 02/11/20               PT Long Term Goals - 08/20/20 0706       PT LONG TERM GOAL #1   Title The patient will be compliant with finalized HEP with minimum assistance in preparation for self management and maintenance of condition.    Baseline 03/10/2020- Patient familiar with initial  HEP and will keep goal active as HEP becomes more progressive including possible transfers and standing activities. 04/07/2020- Patient continues to report compliance with LE strengthening home exercises without questions or concerns. 04/21/2020- Patient able to verbalize and demonstrate good understanding of current HEP including some supine and seated LE strengthening exercises. Reviewed and patient performed 10 reps today with minimal cueing. Will keep goal active to progress HEP as appropriate. 05/28/2020- patient reports continues to perform LE strengthening as able and some help from caregiver as able. No changes at this time to home program as patient is limited to supine/seated therex. 07/02/2020- Patient  continues to report compliance with current supine and seated LE strengtheing home program and states no questions or concerns regarding her plan.    Time 12    Period Weeks    Status Achieved      PT LONG TERM GOAL #2   Title The patient will demonstrate at least 10 point improvement on FOTO score indicating an improved ability to perform functional activities.    Baseline on eval (/720) score 12; 10/22/19: 12; 12/10/19: 18, 12/13: 18: 04/07/2020- Will assess next visit. 07/02/2020- Will obtain next visit.    Time 12    Period Weeks    Status On-going    Target Date 09/24/20      PT LONG TERM GOAL #3   Title The patient will demonstrate lateral scooting  for assistance with transfers with minA to maximize independence.    Baseline 10/22/19: Pt requires modA+1 for lateral scooting; 12/10/19: modA+1, 12/13: modA-maxA, 12/27: modA-maxA, 03/10/2020- ModA-MaxA- increased verbal cues and visual demonstration. 04/07/2020- Continued Max assist to perform forward/lateral scoot. 04/21/2020- Patient was able to demo slight lateral scoot with CGA approx 12 in then fatigued requiring max assist- she is able to scoot forward by leaning without significant issues. 05/28/2020- While sitting in chair- patient able to  perform forward and lateral scoot with VC/visual demo and increased time allowed- CGA today but not consistent yet- will keep goal active at this time to continue to focus on strengthening and improving technique. 07/02/2020- Patient able to demonstrate minimal ability to laterally shift weigh on mat table requiring max assist yet is able to forward scoot out to edge of mat table with min Assist. 08/20/2020 - Patient was able to demonstrate minimal lateral scooting at edge of mat while holding feet still so they did not slip on tile surface. She continues to be able to demonstrate scoot forward and uses tilt option to recline to position safely back into seat of power wheelchair.    Time 12    Period Weeks    Status On-going    Target Date 09/24/20      PT LONG TERM GOAL #4   Title The patient will demonstrate a squat pivot transfer with minimum assistance to maximize independence and mobility.    Baseline 10/22/19: unable to safely attempt at this time; 12/10/19: unable to safely attempt at this time, 01/14/20: unable to safety attempt at this time. 03/10/2020- Patient able to perform Stand pivot transfer with Maximal assist. 04/07/2020- Patient continues to require Max assist with sit to stand- will assist SPT next visit. 04/21/2020- Patient presents with Max assist to perform Sit to stand Transfer- unable to pivot or move Left LE well without difficulty. 07/02/2020= Max assist with SPT and minimal ability to pivot. 7/202/2022-Patient able to stand with minimal assist this visit (as good as CGA last visit) and able to move right foot to pivot and performing with moderate assist today.  ?    Time 12    Period Weeks    Status On-going    Target Date 09/24/20      PT LONG TERM GOAL #5   Title The patient will demonstrate sitting without UE support for 2-5 minutes at EOB to improve participation and maximize independence with ADLs.    Baseline 10/22/19: Pt able to sit unsupported for at least 2 minutes at edge of  bed. 04/21/2020- Patient continues to demo good sitting at edge of mat > 5 miin without difficulty or back support.    Time 12  Period Weeks    Status Achieved      PT LONG TERM GOAL #6   Title Patient will demonstrate improved functional LE strength as seen by consistent ability to stand > 2 min (3 out of 4 trials) with Mod/Max A for improved LE strength with transfers.    Baseline 05/28/2020- Patient able to inconsistent stand 1-2 min right now in //bars with Mod/Max A. 07/02/2020- Patient was able to progress last 2 visit to standing using bilateral platform attachment between 1-2 min which is a progression from the parallel bars. 08/20/2020- Patient performed static standing with min- mod A using bilateral platform attachment on walker for 6 min 30 sec today with assist for trunk control and intermittent verbal cues for posture.    Time 12    Period Weeks    Status On-going    Target Date 09/24/20                   Plan - 09/15/20 1152     Clinical Impression Statement Patient was able to demo good ability to stand with min assist and able perform some dynamic lateral weight shift. She was able to move each leg yet poor control of Left LE yet no right knee buckling today. She continues to lack sufficient strength to adequately try to walk yet and may benefit from use of Biodex unweighing system next week. She will also benefit from continued LE strengthening to promote improved standing and ability to transfer. Patient will benefit from continued PT services to increase mobility and strength to improve patient's quality of life.    Personal Factors and Comorbidities Age;Time since onset of injury/illness/exacerbation;Comorbidity 3+;Fitness    Comorbidities HTN, quadriparesis, history of DVT, neurogenic bladder    Examination-Activity Limitations Bathing;Hygiene/Grooming;Squat;Bed Mobility;Lift;Bend;Stand;Engineer, manufacturing;Toileting;Self  Feeding;Transfers;Continence;Sit;Dressing;Sleep;Carry    Examination-Participation Restrictions Church;Laundry;Cleaning;Medication Management;Community Activity;Meal Prep;Interpersonal Relationship    Stability/Clinical Decision Making Evolving/Moderate complexity    Rehab Potential Fair    PT Frequency 2x / week    PT Duration 12 weeks    PT Treatment/Interventions ADLs/Self Care Home Management;Electrical Stimulation;Therapeutic activities;Wheelchair mobility training;Vasopneumatic Device;Joint Manipulations;Vestibular;Passive range of motion;Patient/family education;Therapeutic exercise;DME Instruction;Biofeedback;Aquatic Therapy;Moist Heat;Gait training;Balance training;Orthotic Fit/Training;Dry needling;Energy conservation;Taping;Splinting;Neuromuscular re-education;Cryotherapy;Ultrasound;Functional mobility training    PT Next Visit Plan Continue with progressive Standing/transfer training.    PT Home Exercise Plan no changes    Consulted and Agree with Plan of Care Patient             Patient will benefit from skilled therapeutic intervention in order to improve the following deficits and impairments:  Abnormal gait, Decreased balance, Decreased endurance, Decreased mobility, Difficulty walking, Hypomobility, Impaired sensation, Decreased range of motion, Improper body mechanics, Impaired perceived functional ability, Decreased activity tolerance, Decreased knowledge of use of DME, Decreased safety awareness, Decreased strength, Impaired flexibility, Impaired UE functional use, Postural dysfunction  Visit Diagnosis: Abnormality of gait and mobility  Difficulty in walking, not elsewhere classified  Muscle weakness (generalized)  Unsteadiness on feet     Problem List Patient Active Problem List   Diagnosis Date Noted   Acute appendicitis with appendiceal abscess 05/30/2018   Hypocalcemia 02/15/2018   Dysuria 02/15/2018   Bilateral lower extremity edema 02/11/2018   Vaginal  yeast infection 01/30/2018   Chest tightness 01/27/2018   Healthcare-associated pneumonia 01/25/2018   Pleural effusion, not elsewhere classified 01/25/2018   Acute deep vein thrombosis (DVT) of left lower extremity ([place]) 01/24/2018   Acute deep vein thrombosis (DVT) of right lower extremity ([place]) 01/24/2018  Chronic allergic rhinitis 01/24/2018   Depression with anxiety 01/24/2018   UTI due to Klebsiella species 01/24/2018   Protein-calorie malnutrition, severe ([place]) 01/24/2018   S/P insertion of IVC (inferior vena caval) filter 01/24/2018   Reactive depression    Hypokalemia    Leukocytosis    Essential hypertension    Trauma    Tetraparesis ([facility])    Neuropathic pain    Neurogenic bowel    Neurogenic bladder    Benign essential HTN    Acute blood loss anemia    Central cord syndrome at C6 level of cervical spinal cord ([place]) 11/29/2017   Central cord syndrome ([place]) 11/29/2017    Lewis Moccasin, PT 09/15/2020, 3:11 PM  [place] MAIN [facility] SERVICES [address] [place], [place], [address] Phone: [phone]   Fax:  [phone]  Name: Sherry [name] MRN: [id number] Date of Birth: 1934-12-22

## 2020-09-15 NOTE — Therapy (Signed)
[place] MAIN [facility] SERVICES [address] [place], [place], [address] Phone: [phone]   Fax:  [phone]  Occupational Therapy Treatment  Patient Details  Name: Sherry [name] MRN: [id number] Date of Birth: 1934/03/25 No data recorded  Encounter Date: 09/15/2020   OT End of Session - 09/15/20 1431     Visit Number 26    Number of Visits 34    Date for OT Re-Evaluation 10/22/20    Authorization Time Period Progress report period starting 09/08/2020    OT Start Time 1100    OT Stop Time 1145    OT Time Calculation (min) 45 min    Activity Tolerance Patient tolerated treatment well    Behavior During Therapy [facility] for tasks assessed/performed             Past Medical History:  Diagnosis Date   Acute blood loss anemia    Arthritis    Cancer ([place])    skin   Central cord syndrome at C6 level of cervical spinal cord ([place]) 11/29/2017   Hypertension    Protein-calorie malnutrition, severe ([place]) 01/24/2018   S/P insertion of IVC (inferior vena caval) filter 01/24/2018   Tetraparesis ([place])     Past Surgical History:  Procedure Laterality Date   ANTERIOR CERVICAL DECOMP/DISCECTOMY FUSION N/A 11/29/2017   Procedure: Cervical five-six, six-seven Anterior Cervical Decompression Fusion;  Surgeon: Judith Part, MD;  Location: [place];  Service: Neurosurgery;  Laterality: N/A;  Cervical five-six, six-seven Anterior Cervical Decompression Fusion   CATARACT EXTRACTION     IR IVC FILTER PLMT / S&I /IMG GUID/MOD SED  12/08/2017    There were no vitals filed for this visit.   Subjective Assessment - 09/15/20 1431     Subjective  Pt reports enjoying her [birth year] last [birth year]    Patient is accompanied by: Family member    Pertinent History Patient s/p fall November 29, 2017 resulting in diagnosis of central cord syndrome at C 6 level.  She has had therapy in multiple venues but with recent move to [facility], therapy staff recommended she  seek outpatient therapy for her needs.    Patient Stated Goals Patient would like to be able to move better in bed, stand and perform self care tasks.    Currently in Pain? No/denies            OT Treatment   Pt. tolerated bilateral shoulder flexion, extension, abduction, elbow flexion, extension forearm supination AAROM/AROM. PROM bilateral wrist extension, digit MP, PIP, and DIP flexion, and extension, thumb radial, and palmar abduction in conjunction with moist heat. Pt. worked on reaching using the [employer]. Pt. grasped and moved the shapes through 3 vertical dowels of varying heights x's 1 rep. Pt. was able to perform the task more freely today. Pt. Worked on Bilateral UE reaching, and ROM in order to improve functional reaching for ADL, IADL tasks, and haircare.   Pt. continues to make progress overall, and is improving with BUE reaching in preparation for improving overhead ADLs, and haircare. Pt. continues to tolerate ROM, moist heat modality, and functional reaching without pain. Pt. was able  to move the shapes through 3 vertical dowels of progressively increasing heights with both the right, and left UEs with less time required, and increased reps. No leaning noted today during reaching tasks. Pt. tolerated ROM well without reports of pain, or discomfort. Pt. continues to benefit from working on improving ROM in order to work towards increasing  engagement of her bilateral hands during ADLs, and IADL tasks.                       OT Education - 09/15/20 1431     Education Details BUE ROM with functional reaching, goals    Person(s) Educated Patient;Caregiver(s)    Methods Explanation;Demonstration    Comprehension Verbalized understanding;Need further instruction                 OT Long Term Goals - 09/08/20 1100       OT LONG TERM GOAL #1   Title Patient and caregiver will demonstrate understanding of home exercise program for ROM.    Baseline Pt.  continues to have a restorative aide rehab aide assist her wih exercises at [facility]. 8/8: Independently completes/directs HEP mulitple times/week    Time 12    Period Weeks    Status Partially Met    Target Date 10/22/20      OT LONG TERM GOAL #4   Title Patient will demonstrate improved composite finger flexion to hold deodorant to apply to underarms.    Baseline Pt. continues to present with improving finger flexion of R hand for a partial gross grasp, but unable to grasp and hold items. pt. conitnues to present with limited digit flexion of L hand. Pt. has active left thumb abduction. 8/8: holds cup with 2 hands, continues to have limited composite finger flexion L worse than R    Time 12    Period Weeks    Status On-going    Target Date 10/22/20      OT LONG TERM GOAL #6   Title Patient will increase right UE ROM to comb the back of her hair with modified independence.    Baseline 07/30/2020: Pt. is now able to comb the back of her hair with a pic, however continues to work on completing the task efficiently. 8/8: Achieves with great effort, has assistance for efficiancy    Time 12    Period Weeks    Status Partially Met      OT LONG TERM GOAL #7   Title Pt. will independently, and efficiently send text messages on her phone.    Baseline 07/30/2020: Pt. continues to present with limited BUE strength, and [facility] skills. 8/8: independtly drafts text messages, continues to work on accuracy and efficiancy 2/2 limited [facility]    Time 12    Period Weeks    Status Partially Met    Target Date 10/22/20      OT LONG TERM GOAL #8   Title Pt. will write, and sign her name with 100% legibility, and modified independence.    Baseline Pt. continues to  consistently filling out her daily menu, and writing for crossword puzzles. Pt. is able to maintain grasp on a wide width pen. [place] to work on [employer]. 8/8: writes daily, continues to work on improving speed    Time 12    Period Weeks     Status Partially Met    Target Date 10/22/20                   Plan - 09/15/20 1432     Clinical Impression Statement Pt. continues to make progress overall, and is improving with BUE reaching in preparation for improving overhead ADLs, and haircare. Pt. continues to tolerate ROM, moist heat modality, and functional reaching without pain. Pt. was able  to move the shapes through 3  vertical dowels of progressively increasing heights with both the right, and left UEs with less time required, and increased reps. No leaning noted today during reaching tasks. Pt. tolerated ROM well without reports of pain, or discomfort. Pt. continues to benefit from working on improving ROM in order to work towards increasing engagement of her bilateral hands during ADLs, and IADL tasks.    OT Occupational Profile and History Detailed Assessment- Review of Records and additional review of physical, cognitive, psychosocial history related to current functional performance    Occupational performance deficits (Please refer to evaluation for details): ADL's;IADL's;Leisure;Social Participation    Body Structure / Function / Physical Skills ADL;Continence;Dexterity;Flexibility;Strength;ROM;Balance;Coordination;FMC;IADL;Endurance;UE functional use;Decreased knowledge of use of DME;GMC    Psychosocial Skills Environmental  Adaptations;Habits;Routines and Behaviors    Rehab Potential Fair    Clinical Decision Making Several treatment options, min-mod task modification necessary    Comorbidities Affecting Occupational Performance: Presence of comorbidities impacting occupational performance    Modification or Assistance to Complete Evaluation  No modification of tasks or assist necessary to complete eval    OT Frequency 2x / week    OT Duration 12 weeks    OT Treatment/Interventions Self-care/ADL training;Cryotherapy;Therapeutic exercise;DME and/or AE instruction;Balance training;Neuromuscular education;Manual  Therapy;Splinting;Moist Heat;Passive range of motion;Therapeutic activities;Patient/family education    Plan continue to progress ROM of digits, wrists and shoulders as it pertains to completion of ADL tasks that pt values.    Consulted and Agree with Plan of Care Patient             Patient will benefit from skilled therapeutic intervention in order to improve the following deficits and impairments:   Body Structure / Function / Physical Skills: ADL, Continence, Dexterity, Flexibility, Strength, ROM, Balance, Coordination, FMC, IADL, Endurance, UE functional use, Decreased knowledge of use of DME, GMC   Psychosocial Skills: Environmental  Adaptations, Habits, Routines and Behaviors   Visit Diagnosis: No diagnosis found.    Problem List Patient Active Problem List   Diagnosis Date Noted   Acute appendicitis with appendiceal abscess 05/30/2018   Hypocalcemia 02/15/2018   Dysuria 02/15/2018   Bilateral lower extremity edema 02/11/2018   Vaginal yeast infection 01/30/2018   Chest tightness 01/27/2018   Healthcare-associated pneumonia 01/25/2018   Pleural effusion, not elsewhere classified 01/25/2018   Acute deep vein thrombosis (DVT) of left lower extremity ([place]) 01/24/2018   Acute deep vein thrombosis (DVT) of right lower extremity ([place]) 01/24/2018   Chronic allergic rhinitis 01/24/2018   Depression with anxiety 01/24/2018   UTI due to Klebsiella species 01/24/2018   Protein-calorie malnutrition, severe ([place]) 01/24/2018   S/P insertion of IVC (inferior vena caval) filter 01/24/2018   Reactive depression    Hypokalemia    Leukocytosis    Essential hypertension    Trauma    Tetraparesis (H[facility])    Neuropathic pain    Neurogenic bowel    Neurogenic bladder    Benign essential HTN    Acute blood loss anemia    Central cord syndrome at C6 level of cervical spinal cord ([place]) 11/29/2017   Central cord syndrome ([place]) 11/29/2017    Harrel Carina, MS, OTR/L 09/15/2020,  2:34 PM  [place] MAIN [facility] SERVICES [address] [place], [place], [address] Phone: [phone]   Fax:  [phone]  Name: Sherry [name] MRN: [id number] Date of Birth: 01/09/1935

## 2020-09-17 ENCOUNTER — Ambulatory Visit: Payer: Medicare HMO | Admitting: Occupational Therapy

## 2020-09-17 ENCOUNTER — Encounter: Payer: Self-pay | Admitting: Occupational Therapy

## 2020-09-17 ENCOUNTER — Other Ambulatory Visit: Payer: Self-pay

## 2020-09-17 ENCOUNTER — Ambulatory Visit: Payer: Medicare HMO | Admitting: Physical Therapy

## 2020-09-17 DIAGNOSIS — R262 Difficulty in walking, not elsewhere classified: Secondary | ICD-10-CM

## 2020-09-17 DIAGNOSIS — R2689 Other abnormalities of gait and mobility: Secondary | ICD-10-CM

## 2020-09-17 DIAGNOSIS — O2230 Deep phlebothrombosis in pregnancy, unspecified trimester: Secondary | ICD-10-CM

## 2020-09-17 DIAGNOSIS — R2681 Unsteadiness on feet: Secondary | ICD-10-CM

## 2020-09-17 DIAGNOSIS — M6281 Muscle weakness (generalized): Secondary | ICD-10-CM

## 2020-09-17 DIAGNOSIS — S14129S Central cord syndrome at unspecified level of cervical spinal cord, sequela: Secondary | ICD-10-CM

## 2020-09-17 DIAGNOSIS — R140 Abdominal distension (gaseous): Secondary | ICD-10-CM

## 2020-09-17 DIAGNOSIS — R269 Unspecified abnormalities of gait and mobility: Secondary | ICD-10-CM

## 2020-09-17 DIAGNOSIS — R278 Other lack of coordination: Secondary | ICD-10-CM

## 2020-09-17 NOTE — Therapy (Signed)
[place] MAIN [facility] SERVICES [address] [place], [place], [address] Phone: [phone]   Fax:  [phone]  Physical Therapy Treatment  Patient Details  Name: Sherry [name] MRN: [id number] Date of Birth: 1934-11-08 No data recorded  Encounter Date: 09/17/2020   PT End of Session - 09/17/20 1240     Visit Number 86    Number of Visits 104    Date for PT Re-Evaluation 09/24/20    Authorization Type a[employer]na medicare FOTO performed by PT on eval (7/20), score 12, Progress note on 03/10/2020; PN on 08/20/2020    Authorization Time Period Recert [id number]; PN on 07/07/2020    PT Start Time 1149    PT Stop Time 1232    PT Time Calculation (min) 43 min    Equipment Utilized During Treatment Gait belt    Activity Tolerance Patient tolerated treatment well;Patient limited by fatigue    Behavior During Therapy WFL for tasks assessed/performed             Past Medical History:  Diagnosis Date   Acute blood loss anemia    Arthritis    Cancer ([place])    skin   Central cord syndrome at C6 level of cervical spinal cord ([place]) 11/29/2017   Hypertension    Protein-calorie malnutrition, severe ([place]) 01/24/2018   S/P insertion of IVC (inferior vena caval) filter 01/24/2018   Tetraparesis ([place])     Past Surgical History:  Procedure Laterality Date   ANTERIOR CERVICAL DECOMP/DISCECTOMY FUSION N/A 11/29/2017   Procedure: Cervical five-six, six-seven Anterior Cervical Decompression Fusion;  Surgeon: Judith Part, MD;  Location: [place];  Service: Neurosurgery;  Laterality: N/A;  Cervical five-six, six-seven Anterior Cervical Decompression Fusion   CATARACT EXTRACTION     IR IVC FILTER PLMT / S&I /IMG GUID/MOD SED  12/08/2017    There were no vitals filed for this visit.   Subjective Assessment - 09/17/20 1153     Subjective States she is doing well today. Patient reports no new changes or complaints. States she OT went well.     Pertinent History Pt is an 86 yo female that fell in 2019, fractured vertebrae in her neck and in her low back per family. 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 After hospital stay, pt discharged to [facility] for ~1 month, experienced severe UTI as well as bilateral DVT (IVC filter placed). Discharged to [facility], stayed for about a year, and then moved to [facility] in April 2021. Was receiving PT, but per family facility reported that did not have adequate equipment to maximize PT for her. Pt until about 1 month ago was practicing sit to stand transfers with 1-2 people, and working on static standing. Has a brace for L foot due to PF. Pt currently needs assistance with all ADLs (able to assist with donning/doffing her shirt), bed baths, and needs a hoyer lift for transfers. Able to drive power wheelchair without assistance.    Limitations Standing;Walking;House hold activities;Lifting    How long can you sit comfortably? NA    How long can you stand comfortably? unable    How long can you walk comfortably? unable    Patient Stated Goals "to stand up and walk"    Currently in Pain? No/denies                Interventions:    Therapeutic exercises:   All exercises performed in sitting:  Knee ext (AAROM on right-  patient able to achieve approx 3/4 motion prior to requiring some assistance) BLE 2 x 15 reps each with 3 sec hold; Hip flexion march (AAROM BLE) 2 x 15 reps each.  Hip abduction, bilateral against manual resistance (moderate) of PT 2 x 15 reps; Hip adduction, bilateral with ball squeeze for resistance 2 x 15 reps; Hamstring curl, BLE resisted by red theraband 2 x 15 reps each; Leg press, PWC tilted back, BLE manually resisted (moderate) by PT 2 x 15 reps each;   Neuromuscular re-education:    Attempted STS transfer x 3 reps from [facility] in // bars without success today. PT provided MAX A to block L foot, provide knee block and lift. Pt aide assisted PT with  returning pt to chair due to forward movement of hips to edge of seat. PT and pt agreeable on altering POC to seated therex for today's session.       Clinical Impression: Patient arrived with excellent motivation to today's session. She demo increased difficulty with STS activity today; typically able to perform with MIN A, today MAX A was provided for 3 attempts without standing success. Pt unsure about change in status today - aide did mention increased activity this morning calling it "pre-PT before PT". POC was altered to seated therex for pt safety. Pt performed 2 sets of each exercise with mild-moderate fatigue. Assist was provided to BLE (right more than left) to assist completion of ROM and to also guide movement. Patient will benefit from continued PT services to increase strength, mobility and consistency of performance to improve patient's quality of life.            PT Short Term Goals - 03/10/20 2211       PT SHORT TERM GOAL #1   Title The patient will perform initial HEP with minimum assistance in order to improve strength and function.    Baseline Patient demonstrating independence with initial HEP as of 03/10/2020 with no questions or difficulty.    Time 4    Period Weeks    Status Achieved    Target Date 02/11/20               PT Long Term Goals - 08/20/20 0706       PT LONG TERM GOAL #1   Title The patient will be compliant with finalized HEP with minimum assistance in preparation for self management and maintenance of condition.    Baseline 03/10/2020- Patient familiar with initial HEP and will keep goal active as HEP becomes more progressive including possible transfers and standing activities. 04/07/2020- Patient continues to report compliance with LE strengthening home exercises without questions or concerns. 04/21/2020- Patient able to verbalize and demonstrate good understanding of current HEP including some supine and seated LE strengthening exercises.  Reviewed and patient performed 10 reps today with minimal cueing. Will keep goal active to progress HEP as appropriate. 05/28/2020- patient reports continues to perform LE strengthening as able and some help from caregiver as able. No changes at this time to home program as patient is limited to supine/seated therex. 07/02/2020- Patient continues to report compliance with current supine and seated LE strengtheing home program and states no questions or concerns regarding her plan.    Time 12    Period Weeks    Status Achieved      PT LONG TERM GOAL #2   Title The patient will demonstrate at least 10 point improvement on FOTO score indicating an improved ability to perform functional  activities.    Baseline on eval (/720) score 12; 10/22/19: 12; 12/10/19: 18, 12/13: 18: 04/07/2020- Will assess next visit. 07/02/2020- Will obtain next visit.    Time 12    Period Weeks    Status On-going    Target Date 09/24/20      PT LONG TERM GOAL #3   Title The patient will demonstrate lateral scooting  for assistance with transfers with minA to maximize independence.    Baseline 10/22/19: Pt requires modA+1 for lateral scooting; 12/10/19: modA+1, 12/13: modA-maxA, 12/27: modA-maxA, 03/10/2020- ModA-MaxA- increased verbal cues and visual demonstration. 04/07/2020- Continued Max assist to perform forward/lateral scoot. 04/21/2020- Patient was able to demo slight lateral scoot with CGA approx 12 in then fatigued requiring max assist- she is able to scoot forward by leaning without significant issues. 05/28/2020- While sitting in chair- patient able to perform forward and lateral scoot with VC/visual demo and increased time allowed- CGA today but not consistent yet- will keep goal active at this time to continue to focus on strengthening and improving technique. 07/02/2020- Patient able to demonstrate minimal ability to laterally shift weigh on mat table requiring max assist yet is able to forward scoot out to edge of mat table with min  Assist. 08/20/2020 - Patient was able to demonstrate minimal lateral scooting at edge of mat while holding feet still so they did not slip on tile surface. She continues to be able to demonstrate scoot forward and uses tilt option to recline to position safely back into seat of power wheelchair.    Time 12    Period Weeks    Status On-going    Target Date 09/24/20      PT LONG TERM GOAL #4   Title The patient will demonstrate a squat pivot transfer with minimum assistance to maximize independence and mobility.    Baseline 10/22/19: unable to safely attempt at this time; 12/10/19: unable to safely attempt at this time, 01/14/20: unable to safety attempt at this time. 03/10/2020- Patient able to perform Stand pivot transfer with Maximal assist. 04/07/2020- Patient continues to require Max assist with sit to stand- will assist SPT next visit. 04/21/2020- Patient presents with Max assist to perform Sit to stand Transfer- unable to pivot or move Left LE well without difficulty. 07/02/2020= Max assist with SPT and minimal ability to pivot. 7/202/2022-Patient able to stand with minimal assist this visit (as good as CGA last visit) and able to move right foot to pivot and performing with moderate assist today.  ?    Time 12    Period Weeks    Status On-going    Target Date 09/24/20      PT LONG TERM GOAL #5   Title The patient will demonstrate sitting without UE support for 2-5 minutes at EOB to improve participation and maximize independence with ADLs.    Baseline 10/22/19: Pt able to sit unsupported for at least 2 minutes at edge of bed. 04/21/2020- Patient continues to demo good sitting at edge of mat > 5 miin without difficulty or back support.    Time 12    Period Weeks    Status Achieved      PT LONG TERM GOAL #6   Title Patient will demonstrate improved functional LE strength as seen by consistent ability to stand > 2 min (3 out of 4 trials) with Mod/Max A for improved LE strength with transfers.     Baseline 05/28/2020- Patient able to inconsistent stand 1-2 min right now in //  bars with Mod/Max A. 07/02/2020- Patient was able to progress last 2 visit to standing using bilateral platform attachment between 1-2 min which is a progression from the parallel bars. 08/20/2020- Patient performed static standing with min- mod A using bilateral platform attachment on walker for 6 min 30 sec today with assist for trunk control and intermittent verbal cues for posture.    Time 12    Period Weeks    Status On-going    Target Date 09/24/20                   Plan - 09/17/20 1302     Clinical Impression Statement Patient arrived with excellent motivation to today's session. She demo increased difficulty with STS activity today; typically able to perform with MIN A, today MAX A was provided for 3 attempts without standing success. Pt unsure about change in status today - aide did mention increased activity this morning calling it "pre-PT before PT". POC was altered to seated therex for pt safety. Pt performed 2 sets of each exercise with mild-moderate fatigue. Assist was provided to BLE (right more than left) to assist completion of ROM and to also guide movement. Patient will benefit from continued PT services to increase strength, mobility and consistency of performance to improve patient's quality of life.    Personal Factors and Comorbidities Age;Time since onset of injury/illness/exacerbation;Comorbidity 3+;Fitness    Comorbidities HTN, quadriparesis, history of DVT, neurogenic bladder    Examination-Activity Limitations Bathing;Hygiene/Grooming;Squat;Bed Mobility;Lift;Bend;Stand;Engineer, manufacturing;Toileting;Self Feeding;Transfers;Continence;Sit;Dressing;Sleep;Carry    Examination-Participation Restrictions Church;Laundry;Cleaning;Medication Management;Community Activity;Meal Prep;Interpersonal Relationship    Stability/Clinical Decision Making Evolving/Moderate complexity    Rehab  Potential Fair    PT Frequency 2x / week    PT Duration 12 weeks    PT Treatment/Interventions ADLs/Self Care Home Management;Electrical Stimulation;Therapeutic activities;Wheelchair mobility training;Vasopneumatic Device;Joint Manipulations;Vestibular;Passive range of motion;Patient/family education;Therapeutic exercise;DME Instruction;Biofeedback;Aquatic Therapy;Moist Heat;Gait training;Balance training;Orthotic Fit/Training;Dry needling;Energy conservation;Taping;Splinting;Neuromuscular re-education;Cryotherapy;Ultrasound;Functional mobility training    PT Next Visit Plan Continue with progressive Standing/transfer training.    PT Home Exercise Plan no changes    Consulted and Agree with Plan of Care Patient             Patient will benefit from skilled therapeutic intervention in order to improve the following deficits and impairments:  Abnormal gait, Decreased balance, Decreased endurance, Decreased mobility, Difficulty walking, Hypomobility, Impaired sensation, Decreased range of motion, Improper body mechanics, Impaired perceived functional ability, Decreased activity tolerance, Decreased knowledge of use of DME, Decreased safety awareness, Decreased strength, Impaired flexibility, Impaired UE functional use, Postural dysfunction  Visit Diagnosis: Abnormality of gait and mobility  Muscle weakness (generalized)  Abdominal distention  Difficulty in walking, not elsewhere classified  Other lack of coordination  Unsteadiness on feet  Other abnormalities of gait and mobility  Central cord syndrome, sequela (HCC)  DVT (deep vein thrombosis) in pregnancy     Problem List Patient Active Problem List   Diagnosis Date Noted   Acute appendicitis with appendiceal abscess 05/30/2018   Hypocalcemia 02/15/2018   Dysuria 02/15/2018   Bilateral lower extremity edema 02/11/2018   Vaginal yeast infection 01/30/2018   Chest tightness 01/27/2018   Healthcare-associated pneumonia  01/25/2018   Pleural effusion, not elsewhere classified 01/25/2018   Acute deep vein thrombosis (DVT) of left lower extremity ([place]) 01/24/2018   Acute deep vein thrombosis (DVT) of right lower extremity (HCC) 01/24/2018   Chronic allergic rhinitis 01/24/2018   Depression with anxiety 01/24/2018   UTI due to Klebsiella species 01/24/2018   Protein-calorie  malnutrition, severe ([place]) 01/24/2018   S/P insertion of IVC (inferior vena caval) filter 01/24/2018   Reactive depression    Hypokalemia    Leukocytosis    Essential hypertension    Trauma    Tetraparesis (HCC)    Neuropathic pain    Neurogenic bowel    Neurogenic bladder    Benign essential HTN    Acute blood loss anemia    Central cord syndrome at C6 level of cervical spinal cord ([place]) 11/29/2017   Central cord syndrome ([place]) 11/29/2017    Patrina Levering PT, DPT  [facility] 09/17/2020, 1:04 PM  [place] MAIN [facility] SERVICES [address] [place], [place], [address] Phone: [phone]   Fax:  [phone]  Name: Sherry [name] MRN: [id number] Date of Birth: 1934-06-26

## 2020-09-17 NOTE — Therapy (Signed)
[place] MAIN [facility] SERVICES [address] [place], [place], [address] Phone: [phone]   Fax:  [phone]  Occupational Therapy Treatment  Patient Details  Name: Sherry [name] MRN: [id number] Date of Birth: 13-Apr-1934 No data recorded  Encounter Date: 09/17/2020   OT End of Session - 09/17/20 1159     Visit Number 81    Number of Visits 39    Date for OT Re-Evaluation 10/22/20    Authorization Time Period Progress report period starting 09/08/2020    OT Start Time 1100    OT Stop Time 1145    OT Time Calculation (min) 45 min    Activity Tolerance Patient tolerated treatment well    Behavior During Therapy [facility] for tasks assessed/performed             Past Medical History:  Diagnosis Date   Acute blood loss anemia    Arthritis    Cancer ([place])    skin   Central cord syndrome at C6 level of cervical spinal cord ([place]) 11/29/2017   Hypertension    Protein-calorie malnutrition, severe ([place]) 01/24/2018   S/P insertion of IVC (inferior vena caval) filter 01/24/2018   Tetraparesis ([place])     Past Surgical History:  Procedure Laterality Date   ANTERIOR CERVICAL DECOMP/DISCECTOMY FUSION N/A 11/29/2017   Procedure: Cervical five-six, six-seven Anterior Cervical Decompression Fusion;  Surgeon: Judith Part, MD;  Location: [facility];  Service: Neurosurgery;  Laterality: N/A;  Cervical five-six, six-seven Anterior Cervical Decompression Fusion   CATARACT EXTRACTION     IR IVC FILTER PLMT / S&I /IMG GUID/MOD SED  12/08/2017    There were no vitals filed for this visit.   Subjective Assessment - 09/17/20 1107     Subjective  Pt reports doing well today.     Patient is accompanied by: Family member    Pertinent History Patient s/p fall November 29, 2017 resulting in diagnosis of central cord syndrome at C 6 level.  She has had therapy in multiple venues but with recent move to [facility], therapy staff recommended she seek outpatient  therapy for her needs.    Patient Stated Goals Patient would like to be able to move better in bed, stand and perform self care tasks.    Currently in Pain? No/denies            OT Treatment   Pt. tolerated bilateral shoulder flexion, extension, abduction, elbow flexion, extension forearm supination AAROM/AROM. PROM bilateral wrist extension, digit MP, PIP, and DIP flexion, and extension, thumb radial, and palmar abduction in conjunction with moist heat. Pt. worked on reaching with abduction reaching out to the far left, and the far right using the shape tower. Pt. grasped and moved the shapes through 3 vertical dowels of varying heights x's 1 rep. Pt. was able to perform the task more freely today. Pt. worked on Bilateral UE reaching, and ROM in order to improve functional reaching for ADL, IADL tasks, and haircare.   Pt. continues to make progress overall, and is improving with BUE reaching in preparation for improving overhead ADLs, and haircare. Pt. continues to tolerate ROM, moist heat modality, and functional reaching without pain. Pt. was able  to reach out to the far left, and far right to move the shapes through through vertical dowels of progressively increasing heights with both the right, and left UEs with less time required, and increased reps. No leaning noted today during reaching tasks. Pt. tolerated ROM well  without reports of pain, or discomfort. Pt. continues to benefit from working on improving ROM in order to work towards increasing engagement of her bilateral hands during ADLs, and IADL tasks.                            OT Education - 09/17/20 1159     Education Details BUE ROM with functional reaching, goals    Person(s) Educated Patient;Caregiver(s)    Methods Explanation;Demonstration    Comprehension Verbalized understanding;Need further instruction                 OT Long Term Goals - 09/08/20 1100       OT LONG TERM GOAL #1   Title  Patient and caregiver will demonstrate understanding of home exercise program for ROM.    Baseline Pt. continues to have a restorative aide rehab aide assist her wih exercises at [facility]. 8/8: Independently completes/directs HEP mulitple times/week    Time 12    Period Weeks    Status Partially Met    Target Date 10/22/20      OT LONG TERM GOAL #4   Title Patient will demonstrate improved composite finger flexion to hold deodorant to apply to underarms.    Baseline Pt. continues to present with improving finger flexion of R hand for a partial gross grasp, but unable to grasp and hold items. pt. conitnues to present with limited digit flexion of L hand. Pt. has active left thumb abduction. 8/8: holds cup with 2 hands, continues to have limited composite finger flexion L worse than R    Time 12    Period Weeks    Status On-going    Target Date 10/22/20      OT LONG TERM GOAL #6   Title Patient will increase right UE ROM to comb the back of her hair with modified independence.    Baseline 07/30/2020: Pt. is now able to comb the back of her hair with a pic, however continues to work on completing the task efficiently. 8/8: Achieves with great effort, has assistance for efficiancy    Time 12    Period Weeks    Status Partially Met      OT LONG TERM GOAL #7   Title Pt. will independently, and efficiently send text messages on her phone.    Baseline 07/30/2020: Pt. continues to present with limited BUE strength, and [facility] skills. 8/8: independtly drafts text messages, continues to work on accuracy and efficiancy 2/2 limited [facility]    Time 12    Period Weeks    Status Partially Met    Target Date 10/22/20      OT LONG TERM GOAL #8   Title Pt. will write, and sign her name with 100% legibility, and modified independence.    Baseline Pt. continues to  consistently filling out her daily menu, and writing for crossword puzzles. Pt. is able to maintain grasp on a wide width pen. [place] to work on  Media planner. 8/8: writes daily, continues to work on improving speed    Time 12    Period Weeks    Status Partially Met    Target Date 10/22/20                   Plan - 09/17/20 1200     Clinical Impression Statement Pt. continues to make progress overall, and is improving with BUE reaching in preparation for improving overhead  ADLs, and haircare. Pt. continues to tolerate ROM, moist heat modality, and functional reaching without pain. Pt. was able  to reach out to the far left, and far right to move the shapes through through vertical dowels of progressively increasing heights with both the right, and left UEs with less time required, and increased reps. No leaning noted today during reaching tasks. Pt. tolerated ROM well without reports of pain, or discomfort. Pt. continues to benefit from working on improving ROM in order to work towards increasing engagement of her bilateral hands during ADLs, and IADL tasks.    OT Occupational Profile and History Detailed Assessment- Review of Records and additional review of physical, cognitive, psychosocial history related to current functional performance    Occupational performance deficits (Please refer to evaluation for details): ADL's;IADL's;Leisure;Social Participation    Body Structure / Function / Physical Skills ADL;Continence;Dexterity;Flexibility;Strength;ROM;Balance;Coordination;FMC;IADL;Endurance;UE functional use;Decreased knowledge of use of DME;GMC    Psychosocial Skills Environmental  Adaptations;Habits;Routines and Behaviors    Rehab Potential Fair    Clinical Decision Making Several treatment options, min-mod task modification necessary    Comorbidities Affecting Occupational Performance: Presence of comorbidities impacting occupational performance    Comorbidities impacting occupational performance description: contractures of bilateral hands, dependent transfers,    Modification or Assistance to Complete Evaluation  No  modification of tasks or assist necessary to complete eval    OT Frequency 2x / week    OT Duration 12 weeks    OT Treatment/Interventions Self-care/ADL training;Cryotherapy;Therapeutic exercise;DME and/or AE instruction;Balance training;Neuromuscular education;Manual Therapy;Splinting;Moist Heat;Passive range of motion;Therapeutic activities;Patient/family education    Consulted and Agree with Plan of Care Patient             Patient will benefit from skilled therapeutic intervention in order to improve the following deficits and impairments:   Body Structure / Function / Physical Skills: ADL, Continence, Dexterity, Flexibility, Strength, ROM, Balance, Coordination, FMC, IADL, Endurance, UE functional use, Decreased knowledge of use of DME, GMC   Psychosocial Skills: Environmental  Adaptations, Habits, Routines and Behaviors   Visit Diagnosis: Muscle weakness (generalized)    Problem List Patient Active Problem List   Diagnosis Date Noted   Acute appendicitis with appendiceal abscess 05/30/2018   Hypocalcemia 02/15/2018   Dysuria 02/15/2018   Bilateral lower extremity edema 02/11/2018   Vaginal yeast infection 01/30/2018   Chest tightness 01/27/2018   Healthcare-associated pneumonia 01/25/2018   Pleural effusion, not elsewhere classified 01/25/2018   Acute deep vein thrombosis (DVT) of left lower extremity ([place]) 01/24/2018   Acute deep vein thrombosis (DVT) of right lower extremity ([place]) 01/24/2018   Chronic allergic rhinitis 01/24/2018   Depression with anxiety 01/24/2018   UTI due to Klebsiella species 01/24/2018   Protein-calorie malnutrition, severe ([place]) 01/24/2018   S/P insertion of IVC (inferior vena caval) filter 01/24/2018   Reactive depression    Hypokalemia    Leukocytosis    Essential hypertension    Trauma    Tetraparesis ([facility])    Neuropathic pain    Neurogenic bowel    Neurogenic bladder    Benign essential HTN    Acute blood loss anemia    Central  cord syndrome at C6 level of cervical spinal cord ([place]) 11/29/2017   Central cord syndrome ([place]) 11/29/2017    Harrel Carina, MS, OTR/L 09/17/2020, 12:03 PM  [place] MAIN [facility] SERVICES [address] [place], [place], [address] Phone: [phone]   Fax:  [phone]  Name: SHARNE LINDERS MRN: [id number] Date of  Birth: 04-03-1934

## 2020-09-22 ENCOUNTER — Encounter: Payer: Self-pay | Admitting: Occupational Therapy

## 2020-09-22 ENCOUNTER — Ambulatory Visit: Payer: Medicare HMO | Admitting: Occupational Therapy

## 2020-09-22 ENCOUNTER — Other Ambulatory Visit: Payer: Self-pay

## 2020-09-22 ENCOUNTER — Ambulatory Visit: Payer: Medicare HMO

## 2020-09-22 DIAGNOSIS — M6281 Muscle weakness (generalized): Secondary | ICD-10-CM | POA: Diagnosis not present

## 2020-09-22 DIAGNOSIS — R278 Other lack of coordination: Secondary | ICD-10-CM

## 2020-09-22 DIAGNOSIS — R269 Unspecified abnormalities of gait and mobility: Secondary | ICD-10-CM

## 2020-09-22 DIAGNOSIS — R2689 Other abnormalities of gait and mobility: Secondary | ICD-10-CM

## 2020-09-22 DIAGNOSIS — R262 Difficulty in walking, not elsewhere classified: Secondary | ICD-10-CM

## 2020-09-22 NOTE — Therapy (Signed)
[place] MAIN [facility] SERVICES [address] [place], [place], [address] Phone: [phone]   Fax:  [phone]  Occupational Therapy Treatment  Patient Details  Name: GERTIE BROERMAN MRN: [id number] Date of Birth: Jul 09, 1934 No data recorded  Encounter Date: 09/22/2020   OT End of Session - 09/22/20 1503     Visit Number 38    Number of Visits 9    Date for OT Re-Evaluation 10/22/20    OT Start Time 1100    OT Stop Time 1145    OT Time Calculation (min) 45 min    Activity Tolerance Patient tolerated treatment well    Behavior During Therapy [facility] for tasks assessed/performed             Past Medical History:  Diagnosis Date   Acute blood loss anemia    Arthritis    Cancer ([place])    skin   Central cord syndrome at C6 level of cervical spinal cord ([place]) 11/29/2017   Hypertension    Protein-calorie malnutrition, severe ([place]) 01/24/2018   S/P insertion of IVC (inferior vena caval) filter 01/24/2018   Tetraparesis ([place])     Past Surgical History:  Procedure Laterality Date   ANTERIOR CERVICAL DECOMP/DISCECTOMY FUSION N/A 11/29/2017   Procedure: Cervical five-six, six-seven Anterior Cervical Decompression Fusion;  Surgeon: Judith Part, MD;  Location: [facility];  Service: Neurosurgery;  Laterality: N/A;  Cervical five-six, six-seven Anterior Cervical Decompression Fusion   CATARACT EXTRACTION     IR IVC FILTER PLMT / S&I /IMG GUID/MOD SED  12/08/2017    There were no vitals filed for this visit.   Subjective Assessment - 09/22/20 1502     Subjective  Pt. reports doing well today.    Patient is accompanied by: Family member    Pertinent History Patient s/p fall November 29, 2017 resulting in diagnosis of central cord syndrome at C 6 level.  She has had therapy in multiple venues but with recent move to [facility], therapy staff recommended she seek outpatient therapy for her needs.    Currently in Pain? No/denies    Pain Score  0-No pain            OT Treatment   Pt. tolerated bilateral shoulder flexion, extension, abduction, elbow flexion, extension forearm supination AAROM/AROM. PROM bilateral wrist extension, digit MP, PIP, and DIP flexion, and extension, thumb radial, and palmar abduction in conjunction with moist heat. Pt. grasped and moved the shapes through 3 vertical dowels of varying heights x's 1 rep. Pt. was able to perform the task more freely today. Pt. worked on Bilateral UE reaching, and ROM in order to improve functional reaching for ADL, IADL tasks, and haircare.   Pt. continues to make progress overall, and is improving with BUE reaching in preparation for improving overhead ADLs, and haircare. Pt. continues to tolerate ROM, moist heat modality, and functional reaching without pain. Pt. was able  to reach up through vertical dowels of progressively increasing heights with less time required, and increased reps. No leaning noted today during reaching tasks. Pt. tolerated ROM well without reports of pain, or discomfort. Pt. continues to benefit from working on improving ROM in order to work towards increasing engagement of her bilateral hands during ADLs, and IADL tasks.                           OT Education - 09/22/20 1503     Education  Details BUE ROM with functional reachng    Person(s) Educated Patient;Caregiver(s)    Methods Explanation;Demonstration    Comprehension Verbalized understanding;Need further instruction                 OT Long Term Goals - 09/08/20 1100       OT LONG TERM GOAL #1   Title Patient and caregiver will demonstrate understanding of home exercise program for ROM.    Baseline Pt. continues to have a restorative aide rehab aide assist her wih exercises at [facility]. 8/8: Independently completes/directs HEP mulitple times/week    Time 12    Period Weeks    Status Partially Met    Target Date 10/22/20      OT LONG TERM GOAL #4   Title  Patient will demonstrate improved composite finger flexion to hold deodorant to apply to underarms.    Baseline Pt. continues to present with improving finger flexion of R hand for a partial gross grasp, but unable to grasp and hold items. pt. conitnues to present with limited digit flexion of L hand. Pt. has active left thumb abduction. 8/8: holds cup with 2 hands, continues to have limited composite finger flexion L worse than R    Time 12    Period Weeks    Status On-going    Target Date 10/22/20      OT LONG TERM GOAL #6   Title Patient will increase right UE ROM to comb the back of her hair with modified independence.    Baseline 07/30/2020: Pt. is now able to comb the back of her hair with a pic, however continues to work on completing the task efficiently. 8/8: Achieves with great effort, has assistance for efficiancy    Time 12    Period Weeks    Status Partially Met      OT LONG TERM GOAL #7   Title Pt. will independently, and efficiently send text messages on her phone.    Baseline 07/30/2020: Pt. continues to present with limited BUE strength, and [facility] skills. 8/8: independtly drafts text messages, continues to work on accuracy and efficiancy 2/2 limited [facility]    Time 12    Period Weeks    Status Partially Met    Target Date 10/22/20      OT LONG TERM GOAL #8   Title Pt. will write, and sign her name with 100% legibility, and modified independence.    Baseline Pt. continues to  consistently filling out her daily menu, and writing for crossword puzzles. Pt. is able to maintain grasp on a wide width pen. [place] to work on Media planner. 8/8: writes daily, continues to work on improving speed    Time 12    Period Weeks    Status Partially Met    Target Date 10/22/20                   Plan - 09/22/20 1503     Clinical Impression Statement Pt. continues to make progress overall, and is improving with BUE reaching in preparation for improving overhead ADLs, and  haircare. Pt. continues to tolerate ROM, moist heat modality, and functional reaching without pain. Pt. was able  to reach up through vertical dowels of progressively increasing heights with less time required, and increased reps. No leaning noted today during reaching tasks. Pt. tolerated ROM well without reports of pain, or discomfort. Pt. continues to benefit from working on improving ROM in order to work towards increasing  engagement of her bilateral hands during ADLs, and IADL tasks.    OT Occupational Profile and History Detailed Assessment- Review of Records and additional review of physical, cognitive, psychosocial history related to current functional performance    Occupational performance deficits (Please refer to evaluation for details): ADL's;IADL's;Leisure;Social Participation    Body Structure / Function / Physical Skills ADL;Continence;Dexterity;Flexibility;Strength;ROM;Balance;Coordination;FMC;IADL;Endurance;UE functional use;Decreased knowledge of use of DME;GMC    Psychosocial Skills Environmental  Adaptations;Habits;Routines and Behaviors    Rehab Potential Fair    Clinical Decision Making Several treatment options, min-mod task modification necessary    Comorbidities Affecting Occupational Performance: Presence of comorbidities impacting occupational performance    Comorbidities impacting occupational performance description: contractures of bilateral hands, dependent transfers,    Modification or Assistance to Complete Evaluation  No modification of tasks or assist necessary to complete eval    OT Frequency 2x / week    OT Duration 12 weeks    OT Treatment/Interventions Self-care/ADL training;Cryotherapy;Therapeutic exercise;DME and/or AE instruction;Balance training;Neuromuscular education;Manual Therapy;Splinting;Moist Heat;Passive range of motion;Therapeutic activities;Patient/family education    Consulted and Agree with Plan of Care Patient             Patient will  benefit from skilled therapeutic intervention in order to improve the following deficits and impairments:   Body Structure / Function / Physical Skills: ADL, Continence, Dexterity, Flexibility, Strength, ROM, Balance, Coordination, FMC, IADL, Endurance, UE functional use, Decreased knowledge of use of DME, GMC   Psychosocial Skills: Environmental  Adaptations, Habits, Routines and Behaviors   Visit Diagnosis: Muscle weakness (generalized)    Problem List Patient Active Problem List   Diagnosis Date Noted   Acute appendicitis with appendiceal abscess 05/30/2018   Hypocalcemia 02/15/2018   Dysuria 02/15/2018   Bilateral lower extremity edema 02/11/2018   Vaginal yeast infection 01/30/2018   Chest tightness 01/27/2018   Healthcare-associated pneumonia 01/25/2018   Pleural effusion, not elsewhere classified 01/25/2018   Acute deep vein thrombosis (DVT) of left lower extremity ([place]) 01/24/2018   Acute deep vein thrombosis (DVT) of right lower extremity ([place]) 01/24/2018   Chronic allergic rhinitis 01/24/2018   Depression with anxiety 01/24/2018   UTI due to Klebsiella species 01/24/2018   Protein-calorie malnutrition, severe ([place]) 01/24/2018   S/P insertion of IVC (inferior vena caval) filter 01/24/2018   Reactive depression    Hypokalemia    Leukocytosis    Essential hypertension    Trauma    Tetraparesis ([facility])    Neuropathic pain    Neurogenic bowel    Neurogenic bladder    Benign essential HTN    Acute blood loss anemia    Central cord syndrome at C6 level of cervical spinal cord ([place]) 11/29/2017   Central cord syndrome ([place]) 11/29/2017    Harrel Carina, MS, OTR/L 09/22/2020, 3:05 PM  [place] MAIN [facility] SERVICES [address] [place], [place], [address] Phone: [phone]   Fax:  [phone]  Name: DEA BITTING MRN: [id number] Date of Birth: January 07, 1935

## 2020-09-22 NOTE — Therapy (Signed)
[place] MAIN [facility] SERVICES [address] [place], [place], [address] Phone: [phone]   Fax:  [phone]  Physical Therapy Treatment  Patient Details  Name: Sherry [name] MRN: [id number] Date of Birth: Aug 27, 1934 No data recorded  Encounter Date: 09/22/2020   PT End of Session - 09/22/20 1155     Visit Number 87    Number of Visits 104    Date for PT Re-Evaluation 09/24/20    Authorization Type a[employer]na medicare FOTO performed by PT on eval (7/20), score 12, Progress note on 03/10/2020; PN on 08/20/2020    Authorization Time Period Recert [id number]; PN on 07/07/2020    PT Start Time 1148    PT Stop Time 1229    PT Time Calculation (min) 41 min    Equipment Utilized During Treatment Gait belt    Activity Tolerance Patient tolerated treatment well;Patient limited by fatigue    Behavior During Therapy WFL for tasks assessed/performed             Past Medical History:  Diagnosis Date   Acute blood loss anemia    Arthritis    Cancer ([place])    skin   Central cord syndrome at C6 level of cervical spinal cord ([place]) 11/29/2017   Hypertension    Protein-calorie malnutrition, severe ([place]) 01/24/2018   S/P insertion of IVC (inferior vena caval) filter 01/24/2018   Tetraparesis ([place])     Past Surgical History:  Procedure Laterality Date   ANTERIOR CERVICAL DECOMP/DISCECTOMY FUSION N/A 11/29/2017   Procedure: Cervical five-six, six-seven Anterior Cervical Decompression Fusion;  Surgeon: Judith Part, MD;  Location: [place];  Service: Neurosurgery;  Laterality: N/A;  Cervical five-six, six-seven Anterior Cervical Decompression Fusion   CATARACT EXTRACTION     IR IVC FILTER PLMT / S&I /IMG GUID/MOD SED  12/08/2017    There were no vitals filed for this visit.   Subjective Assessment - 09/22/20 1154     Subjective Patient reports no new issues - having staffing issues at home. Denies pain.    Pertinent History Pt is an 86  yo female that fell in 2019, fractured vertebrae in her neck and in her low back per family. 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 After hospital stay, pt discharged to [facility] for ~1 month, experienced severe UTI as well as bilateral DVT (IVC filter placed). Discharged to [facility], stayed for about a year, and then moved to [facility] in April 2021. Was receiving PT, but per family facility reported that did not have adequate equipment to maximize PT for her. Pt until about 1 month ago was practicing sit to stand transfers with 1-2 people, and working on static standing. Has a brace for L foot due to PF. Pt currently needs assistance with all ADLs (able to assist with donning/doffing her shirt), bed baths, and needs a hoyer lift for transfers. Able to drive power wheelchair without assistance.    Limitations Standing;Walking;House hold activities;Lifting    How long can you sit comfortably? NA    How long can you stand comfortably? unable    How long can you walk comfortably? unable    Patient Stated Goals "to stand up and walk"    Currently in Pain? No/denies              Interventions:   Therapeutic activity:   Patient performed static standing in //bars:  1) 2 min 30 sec 2) 3 min 45 sec 3) 2  min 55 sec 4) 3 min 33 sec Mostly- patient able to stand today with Min assist using gait belt. She initially demo difficulty with trunk/hip ext- with left knee hyperextended and excessive right knee valgus. She was able to improve on 2nd - 4th trials and abel to shift weight anteriorly for improved center of mass. She only required min assist to maintain standing and no evidence of knee buckling. Patient required brief sitting rest breaks and VC/visual demo between session to demo correct posture with standing.   Clinical Impression: Patient able to return to standing today as she was unable on last visit. She was able to improve transfer back to min assist with no leg  buckling and able to respond to [facility] for improved trunk control despite ongoing LE weakness. Patient will benefit from continued PT services to increase strength, mobility and consistency of performance to improve patient's quality of life.                         PT Education - 09/22/20 1155     Education Details exercise cues    Person(s) Educated Patient    Methods Explanation;Demonstration;Tactile cues;Verbal cues    Comprehension Verbalized understanding;Returned demonstration;Verbal cues required;Tactile cues required;Need further instruction              PT Short Term Goals - 03/10/20 2211       PT SHORT TERM GOAL #1   Title The patient will perform initial HEP with minimum assistance in order to improve strength and function.    Baseline Patient demonstrating independence with initial HEP as of 03/10/2020 with no questions or difficulty.    Time 4    Period Weeks    Status Achieved    Target Date 02/11/20               PT Long Term Goals - 08/20/20 0706       PT LONG TERM GOAL #1   Title The patient will be compliant with finalized HEP with minimum assistance in preparation for self management and maintenance of condition.    Baseline 03/10/2020- Patient familiar with initial HEP and will keep goal active as HEP becomes more progressive including possible transfers and standing activities. 04/07/2020- Patient continues to report compliance with LE strengthening home exercises without questions or concerns. 04/21/2020- Patient able to verbalize and demonstrate good understanding of current HEP including some supine and seated LE strengthening exercises. Reviewed and patient performed 10 reps today with minimal cueing. Will keep goal active to progress HEP as appropriate. 05/28/2020- patient reports continues to perform LE strengthening as able and some help from caregiver as able. No changes at this time to home program as patient is limited to  supine/seated therex. 07/02/2020- Patient continues to report compliance with current supine and seated LE strengtheing home program and states no questions or concerns regarding her plan.    Time 12    Period Weeks    Status Achieved      PT LONG TERM GOAL #2   Title The patient will demonstrate at least 10 point improvement on FOTO score indicating an improved ability to perform functional activities.    Baseline on eval (/720) score 12; 10/22/19: 12; 12/10/19: 18, 12/13: 18: 04/07/2020- Will assess next visit. 07/02/2020- Will obtain next visit.    Time 12    Period Weeks    Status On-going    Target Date 09/24/20      PT  LONG TERM GOAL #3   Title The patient will demonstrate lateral scooting  for assistance with transfers with minA to maximize independence.    Baseline 10/22/19: Pt requires modA+1 for lateral scooting; 12/10/19: modA+1, 12/13: modA-maxA, 12/27: modA-maxA, 03/10/2020- ModA-MaxA- increased verbal cues and visual demonstration. 04/07/2020- Continued Max assist to perform forward/lateral scoot. 04/21/2020- Patient was able to demo slight lateral scoot with CGA approx 12 in then fatigued requiring max assist- she is able to scoot forward by leaning without significant issues. 05/28/2020- While sitting in chair- patient able to perform forward and lateral scoot with VC/visual demo and increased time allowed- CGA today but not consistent yet- will keep goal active at this time to continue to focus on strengthening and improving technique. 07/02/2020- Patient able to demonstrate minimal ability to laterally shift weigh on mat table requiring max assist yet is able to forward scoot out to edge of mat table with min Assist. 08/20/2020 - Patient was able to demonstrate minimal lateral scooting at edge of mat while holding feet still so they did not slip on tile surface. She continues to be able to demonstrate scoot forward and uses tilt option to recline to position safely back into seat of power wheelchair.     Time 12    Period Weeks    Status On-going    Target Date 09/24/20      PT LONG TERM GOAL #4   Title The patient will demonstrate a squat pivot transfer with minimum assistance to maximize independence and mobility.    Baseline 10/22/19: unable to safely attempt at this time; 12/10/19: unable to safely attempt at this time, 01/14/20: unable to safety attempt at this time. 03/10/2020- Patient able to perform Stand pivot transfer with Maximal assist. 04/07/2020- Patient continues to require Max assist with sit to stand- will assist SPT next visit. 04/21/2020- Patient presents with Max assist to perform Sit to stand Transfer- unable to pivot or move Left LE well without difficulty. 07/02/2020= Max assist with SPT and minimal ability to pivot. 7/202/2022-Patient able to stand with minimal assist this visit (as good as CGA last visit) and able to move right foot to pivot and performing with moderate assist today.  ?    Time 12    Period Weeks    Status On-going    Target Date 09/24/20      PT LONG TERM GOAL #5   Title The patient will demonstrate sitting without UE support for 2-5 minutes at EOB to improve participation and maximize independence with ADLs.    Baseline 10/22/19: Pt able to sit unsupported for at least 2 minutes at edge of bed. 04/21/2020- Patient continues to demo good sitting at edge of mat > 5 miin without difficulty or back support.    Time 12    Period Weeks    Status Achieved      PT LONG TERM GOAL #6   Title Patient will demonstrate improved functional LE strength as seen by consistent ability to stand > 2 min (3 out of 4 trials) with Mod/Max A for improved LE strength with transfers.    Baseline 05/28/2020- Patient able to inconsistent stand 1-2 min right now in //bars with Mod/Max A. 07/02/2020- Patient was able to progress last 2 visit to standing using bilateral platform attachment between 1-2 min which is a progression from the parallel bars. 08/20/2020- Patient performed static  standing with min- mod A using bilateral platform attachment on walker for 6 min 30 sec today with assist  for trunk control and intermittent verbal cues for posture.    Time 12    Period Weeks    Status On-going    Target Date 09/24/20                   Plan - 09/22/20 1156     Clinical Impression Statement Patient able to return to standing today as she was unable on last visit. She was able to improve transfer back to min assist with no leg buckling and able to respond to [facility] for improved trunk control despite ongoing LE weakness. Patient will benefit from continued PT services to increase strength, mobility and consistency of performance to improve patient's quality of life.    Personal Factors and Comorbidities Age;Time since onset of injury/illness/exacerbation;Comorbidity 3+;Fitness    Comorbidities HTN, quadriparesis, history of DVT, neurogenic bladder    Examination-Activity Limitations Bathing;Hygiene/Grooming;Squat;Bed Mobility;Lift;Bend;Stand;Engineer, manufacturing;Toileting;Self Feeding;Transfers;Continence;Sit;Dressing;Sleep;Carry    Examination-Participation Restrictions Church;Laundry;Cleaning;Medication Management;Community Activity;Meal Prep;Interpersonal Relationship    Stability/Clinical Decision Making Evolving/Moderate complexity    Rehab Potential Fair    PT Frequency 2x / week    PT Duration 12 weeks    PT Treatment/Interventions ADLs/Self Care Home Management;Electrical Stimulation;Therapeutic activities;Wheelchair mobility training;Vasopneumatic Device;Joint Manipulations;Vestibular;Passive range of motion;Patient/family education;Therapeutic exercise;DME Instruction;Biofeedback;Aquatic Therapy;Moist Heat;Gait training;Balance training;Orthotic Fit/Training;Dry needling;Energy conservation;Taping;Splinting;Neuromuscular re-education;Cryotherapy;Ultrasound;Functional mobility training    PT Next Visit Plan Continue with progressive Standing/transfer  training.    PT Home Exercise Plan no changes    Consulted and Agree with Plan of Care Patient             Patient will benefit from skilled therapeutic intervention in order to improve the following deficits and impairments:  Abnormal gait, Decreased balance, Decreased endurance, Decreased mobility, Difficulty walking, Hypomobility, Impaired sensation, Decreased range of motion, Improper body mechanics, Impaired perceived functional ability, Decreased activity tolerance, Decreased knowledge of use of DME, Decreased safety awareness, Decreased strength, Impaired flexibility, Impaired UE functional use, Postural dysfunction  Visit Diagnosis: Abnormality of gait and mobility  Difficulty in walking, not elsewhere classified  Muscle weakness (generalized)  Other abnormalities of gait and mobility  Other lack of coordination     Problem List Patient Active Problem List   Diagnosis Date Noted   Acute appendicitis with appendiceal abscess 05/30/2018   Hypocalcemia 02/15/2018   Dysuria 02/15/2018   Bilateral lower extremity edema 02/11/2018   Vaginal yeast infection 01/30/2018   Chest tightness 01/27/2018   Healthcare-associated pneumonia 01/25/2018   Pleural effusion, not elsewhere classified 01/25/2018   Acute deep vein thrombosis (DVT) of left lower extremity ([place]) 01/24/2018   Acute deep vein thrombosis (DVT) of right lower extremity ([place]) 01/24/2018   Chronic allergic rhinitis 01/24/2018   Depression with anxiety 01/24/2018   UTI due to Klebsiella species 01/24/2018   Protein-calorie malnutrition, severe ([place]) 01/24/2018   S/P insertion of IVC (inferior vena caval) filter 01/24/2018   Reactive depression    Hypokalemia    Leukocytosis    Essential hypertension    Trauma    Tetraparesis ([facility])    Neuropathic pain    Neurogenic bowel    Neurogenic bladder    Benign essential HTN    Acute blood loss anemia    Central cord syndrome at C6 level of cervical spinal cord  ([place]) 11/29/2017   Central cord syndrome ([place]) 11/29/2017    Lewis Moccasin, PT 09/22/2020, 9:31 PM  [place] MAIN [facility] SERVICES [address] [place], [place], [address] Phone: [phone]   Fax:  [phone]  Name: ARILYN MISCHLER MRN: [id number] Date of Birth: Aug 18, 1934

## 2020-09-24 ENCOUNTER — Other Ambulatory Visit: Payer: Self-pay

## 2020-09-24 ENCOUNTER — Ambulatory Visit: Payer: Medicare HMO

## 2020-09-24 ENCOUNTER — Ambulatory Visit: Payer: Medicare HMO | Admitting: Occupational Therapy

## 2020-09-24 DIAGNOSIS — M6281 Muscle weakness (generalized): Secondary | ICD-10-CM

## 2020-09-24 DIAGNOSIS — R269 Unspecified abnormalities of gait and mobility: Secondary | ICD-10-CM

## 2020-09-24 DIAGNOSIS — R2681 Unsteadiness on feet: Secondary | ICD-10-CM

## 2020-09-24 DIAGNOSIS — R262 Difficulty in walking, not elsewhere classified: Secondary | ICD-10-CM

## 2020-09-24 DIAGNOSIS — R278 Other lack of coordination: Secondary | ICD-10-CM

## 2020-09-24 NOTE — Therapy (Signed)
[name] SERVICES [address] [place], [place], [address] Phone: [phone]   Fax:  [phone]  Physical Therapy Treatment/Recertification for dates of 09/24/2020- 12/17/2020  Patient Details  Name: Sherry [name] MRN: [id number] Date of Birth: Mar 11, 1934 No data recorded  Encounter Date: 09/24/2020   PT End of Session - 09/24/20 1142     Visit Number 21    Number of Visits 104    Date for PT Re-Evaluation 09/24/20    Authorization Type [employer] medicare FOTO performed by PT on eval (7/20), score 12, Progress note on 03/10/2020; PN on 08/20/2020    Authorization Time Period Recert 07/04/2295- 9/89/2119; PN on 05/03/7406; Recert 1/44/8185- 63/14/9702    PT Start Time 1144    PT Stop Time 1226    PT Time Calculation (min) 42 min    Equipment Utilized During Treatment Gait belt    Activity Tolerance Patient tolerated treatment well;Patient limited by fatigue    Behavior During Therapy WFL for tasks assessed/performed             Past Medical History:  Diagnosis Date   Acute blood loss anemia    Arthritis    Cancer ([place])    skin   Central cord syndrome at C6 level of cervical spinal cord ([place]) 11/29/2017   Hypertension    Protein-calorie malnutrition, severe ([place]) 01/24/2018   S/P insertion of IVC (inferior vena caval) filter 01/24/2018   Tetraparesis ([place])     Past Surgical History:  Procedure Laterality Date   ANTERIOR CERVICAL DECOMP/DISCECTOMY FUSION N/A 11/29/2017   Procedure: Cervical five-six, six-seven Anterior Cervical Decompression Fusion;  Surgeon: Judith Part, MD;  Location: [facility];  Service: Neurosurgery;  Laterality: N/A;  Cervical five-six, six-seven Anterior Cervical Decompression Fusion   CATARACT EXTRACTION     IR IVC FILTER PLMT / S&I /IMG GUID/MOD SED  12/08/2017    There were no vitals filed for this visit.   Subjective Assessment - 09/24/20 1141     Subjective Patient reports doing well  with no new concerns. Denies any pain today.    Pertinent History Pt is an 87 yo female that fell in 2019, fractured vertebrae in her neck and in her low back per family. 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 After hospital stay, pt discharged to [facility] for ~1 month, experienced severe UTI as well as bilateral DVT (IVC filter placed). Discharged to [facility], stayed for about a year, and then moved to [facility] in April 2021. Was receiving PT, but per family facility reported that did not have adequate equipment to maximize PT for her. Pt until about 1 month ago was practicing sit to stand transfers with 1-2 people, and working on static standing. Has a brace for L foot due to PF. Pt currently needs assistance with all ADLs (able to assist with donning/doffing her shirt), bed baths, and needs a hoyer lift for transfers. Able to drive power wheelchair without assistance.    Limitations Standing;Walking;House hold activities;Lifting    How long can you sit comfortably? NA    How long can you stand comfortably? unable    How long can you walk comfortably? unable    Patient Stated Goals "to stand up and walk"    Currently in Pain? No/denies              Interventions:    Transfer training: Instructed patient, paid caregiver- [name], and patients daughter, [name] in sit to stand  type mechanical lift (SABINA) today.  Transferred patient from lift chair to mat table and then back x 2 trials to see if patient could potentially use this rather than a dependent hoyer. She was able to tolerate the lift well and may even be able to use this home with assist of caregiver to practice her standing to focus on hip extension and will benefit from more training.       Reassessment of goals:  See goal section for specific details. Patient continues to work on goals including lateral scooting to maneuver easier in the bed.  Transfer goal: Patient is inconsistent but required min/mod A  with SPT transfer today from lift chair to mat table.     Clinical Impression: Patient performed well today with new instruction in use of Sabina lift and and continues to improve over with her transfer status from being totally dependent with hoyer to using [place] and even Stand pivot transfer with increased physical assist. Patient is appropriate for recertification to continue to progress transfer and functional strength.  Patient's condition has the potential to improve in response to therapy. Maximum improvement is yet to be obtained. The anticipated improvement is attainable and reasonable in a generally predictable time.            PT Education - 09/24/20 1142     Education Details Education in use of [place] lift today    Person(s) Educated Patient;Caregiver(s)   [name] (paid caregiver)   Methods Explanation;Demonstration;Tactile cues;Verbal cues    Comprehension Verbalized understanding;Verbal cues required;Need further instruction;Returned demonstration;Tactile cues required              PT Short Term Goals - 03/10/20 2211       PT SHORT TERM GOAL #1   Title The patient will perform initial HEP with minimum assistance in order to improve strength and function.    Baseline Patient demonstrating independence with initial HEP as of 03/10/2020 with no questions or difficulty.    Time 4    Period Weeks    Status Achieved    Target Date 02/11/20               PT Long Term Goals - 09/24/20 1427       PT LONG TERM GOAL #1   Title The patient will be compliant with finalized HEP with minimum assistance in preparation for self management and maintenance of condition.    Baseline 03/10/2020- Patient familiar with initial HEP and will keep goal active as HEP becomes more progressive including possible transfers and standing activities. 04/07/2020- Patient continues to report compliance with LE strengthening home exercises without questions or concerns. 04/21/2020- Patient  able to verbalize and demonstrate good understanding of current HEP including some supine and seated LE strengthening exercises. Reviewed and patient performed 10 reps today with minimal cueing. Will keep goal active to progress HEP as appropriate. 05/28/2020- patient reports continues to perform LE strengthening as able and some help from caregiver as able. No changes at this time to home program as patient is limited to supine/seated therex. 07/02/2020- Patient continues to report compliance with current supine and seated LE strengtheing home program and states no questions or concerns regarding her plan.    Time 12    Period Weeks    Status Achieved      PT LONG TERM GOAL #2   Title The patient will demonstrate at least 10 point improvement on FOTO score indicating an improved ability to perform functional activities.  Baseline on eval (/720) score 12; 10/22/19: 12; 12/10/19: 18, 12/13: 18: 04/07/2020- Will assess next visit. 07/02/2020- Will obtain next visit. 09/24/2020= Will obtain next visit.    Time 12    Period Weeks    Status On-going    Target Date 12/17/20      PT LONG TERM GOAL #3   Title The patient will demonstrate lateral scooting  for assistance with transfers with minA to maximize independence.    Baseline 10/22/19: Pt requires modA+1 for lateral scooting; 12/10/19: modA+1, 12/13: modA-maxA, 12/27: modA-maxA, 03/10/2020- ModA-MaxA- increased verbal cues and visual demonstration. 04/07/2020- Continued Max assist to perform forward/lateral scoot. 04/21/2020- Patient was able to demo slight lateral scoot with CGA approx 12 in then fatigued requiring max assist- she is able to scoot forward by leaning without significant issues. 05/28/2020- While sitting in chair- patient able to perform forward and lateral scoot with VC/visual demo and increased time allowed- CGA today but not consistent yet- will keep goal active at this time to continue to focus on strengthening and improving technique. 07/02/2020-  Patient able to demonstrate minimal ability to laterally shift weigh on mat table requiring max assist yet is able to forward scoot out to edge of mat table with min Assist. 08/20/2020 - Patient was able to demonstrate minimal lateral scooting at edge of mat while holding feet still so they did not slip on tile surface. She continues to be able to demonstrate scoot forward and uses tilt option to recline to position safely back into seat of power wheelchair. 09/25/2020- Patient able to sit on mat and minimally scoot laterally but requires min/mod A for efficient scooting. Will end goal at this time due to plateau in progress with scooting over extended time.    Time 12    Period Weeks    Status Not Met    Target Date 12/17/20      PT LONG TERM GOAL #4   Title The patient will demonstrate a squat pivot transfer with minimum assistance to maximize independence and mobility.    Baseline 10/22/19: unable to safely attempt at this time; 12/10/19: unable to safely attempt at this time, 01/14/20: unable to safety attempt at this time. 03/10/2020- Patient able to perform Stand pivot transfer with Maximal assist. 04/07/2020- Patient continues to require Max assist with sit to stand- will assist SPT next visit. 04/21/2020- Patient presents with Max assist to perform Sit to stand Transfer- unable to pivot or move Left LE well without difficulty. 07/02/2020= Max assist with SPT and minimal ability to pivot. 7/202/2022-Patient able to stand with minimal assist this visit (as good as CGA last visit) and able to move right foot to pivot and performing with moderate assist today. 09/24/2020- Patient able to perform sit to stand with Min assist from lift chair. Patient has demo inconsistency and will keep goal active.    Time 12    Period Weeks    Status On-going    Target Date 12/17/20      PT LONG TERM GOAL #5   Title The patient will demonstrate sitting without UE support for 2-5 minutes at EOB to improve participation and  maximize independence with ADLs.    Baseline 10/22/19: Pt able to sit unsupported for at least 2 minutes at edge of bed. 04/21/2020- Patient continues to demo good sitting at edge of mat > 5 miin without difficulty or back support.    Time 12    Period Weeks    Status Achieved  Additional Long Term Goals   Additional Long Term Goals Yes      PT LONG TERM GOAL #6   Title Patient will demonstrate improved functional LE strength as seen by consistent ability to stand > 2 min (3 out of 4 trials) with Mod/Max A for improved LE strength with transfers.    Baseline 05/28/2020- Patient able to inconsistent stand 1-2 min right now in //bars with Mod/Max A. 07/02/2020- Patient was able to progress last 2 visit to standing using bilateral platform attachment between 1-2 min which is a progression from the parallel bars. 08/20/2020- Patient performed static standing with min- mod A using bilateral platform attachment on walker for 6 min 30 sec today with assist for trunk control and intermittent verbal cues for posture. 09/24/2020- Patient has consistently been able to stand >2 min in // bars and when using bilateral Platform walker.    Time 12    Period Weeks    Status Achieved      PT LONG TERM GOAL #7   Title Patient/caregiver will demonstrate assisted transfer using [place] consistently Independently for improved ability to transfer at home from lift chair to bed.    Baseline 09/24/2020- Patient requires hoyer lift (Dependent) for safe transfers.    Time 12    Period Weeks    Status New    Target Date 12/17/20                   Plan - 09/24/20 1143     Clinical Impression Statement Patient performed well today with new instruction in use of [place]ina lift and and continues to improve over with her transfer status from being totally dependent with hoyer to using [place] and even Stand pivot transfer with increased physical assist. Patient is appropriate for recertification to continue to  progress transfer and functional strength.  Patient's condition has the potential to improve in response to therapy. Maximum improvement is yet to be obtained. The anticipated improvement is attainable and reasonable in a generally predictable time.    Personal Factors and Comorbidities Age;Time since onset of injury/illness/exacerbation;Comorbidity 3+;Fitness    Comorbidities HTN, quadriparesis, history of DVT, neurogenic bladder    Examination-Activity Limitations Bathing;Hygiene/Grooming;Squat;Bed Mobility;Lift;Bend;Stand;Engineer, manufacturing;Toileting;Self Feeding;Transfers;Continence;Sit;Dressing;Sleep;Carry    Examination-Participation Restrictions Church;Laundry;Cleaning;Medication Management;Community Activity;Meal Prep;Interpersonal Relationship    Stability/Clinical Decision Making Evolving/Moderate complexity    Rehab Potential Fair    PT Frequency 2x / week    PT Duration 12 weeks    PT Treatment/Interventions ADLs/Self Care Home Management;Electrical Stimulation;Therapeutic activities;Wheelchair mobility training;Vasopneumatic Device;Joint Manipulations;Vestibular;Passive range of motion;Patient/family education;Therapeutic exercise;DME Instruction;Biofeedback;Aquatic Therapy;Moist Heat;Gait training;Balance training;Orthotic Fit/Training;Dry needling;Energy conservation;Taping;Splinting;Neuromuscular re-education;Cryotherapy;Ultrasound;Functional mobility training    PT Next Visit Plan Continue with progressive Standing/transfer training.    PT Home Exercise Plan no changes    Consulted and Agree with Plan of Care Patient             Patient will benefit from skilled therapeutic intervention in order to improve the following deficits and impairments:  Abnormal gait, Decreased balance, Decreased endurance, Decreased mobility, Difficulty walking, Hypomobility, Impaired sensation, Decreased range of motion, Improper body mechanics, Impaired perceived functional ability,  Decreased activity tolerance, Decreased knowledge of use of DME, Decreased safety awareness, Decreased strength, Impaired flexibility, Impaired UE functional use, Postural dysfunction  Visit Diagnosis: Abnormality of gait and mobility  Difficulty in walking, not elsewhere classified  Muscle weakness (generalized)  Unsteadiness on feet     Problem List Patient Active Problem List   Diagnosis Date Noted  Acute appendicitis with appendiceal abscess 05/30/2018   Hypocalcemia 02/15/2018   Dysuria 02/15/2018   Bilateral lower extremity edema 02/11/2018   Vaginal yeast infection 01/30/2018   Chest tightness 01/27/2018   Healthcare-associated pneumonia 01/25/2018   Pleural effusion, not elsewhere classified 01/25/2018   Acute deep vein thrombosis (DVT) of left lower extremity ([place]) 01/24/2018   Acute deep vein thrombosis (DVT) of right lower extremity ([place]) 01/24/2018   Chronic allergic rhinitis 01/24/2018   Depression with anxiety 01/24/2018   UTI due to Klebsiella species 01/24/2018   Protein-calorie malnutrition, severe ([place]) 01/24/2018   S/P insertion of IVC (inferior vena caval) filter 01/24/2018   Reactive depression    Hypokalemia    Leukocytosis    Essential hypertension    Trauma    Tetraparesis ([facility])    Neuropathic pain    Neurogenic bowel    Neurogenic bladder    Benign essential HTN    Acute blood loss anemia    Central cord syndrome at C6 level of cervical spinal cord ([place]) 11/29/2017   Central cord syndrome ([place]) 11/29/2017    Lewis Moccasin, PT 09/25/2020, 5:43 PM  [name] SERVICES [place], [place], [address] Phone: [phone]   Fax:  [phone]  Name: Sherry [name] MRN: [id number] Date of Birth: 10-Nov-1934

## 2020-09-24 NOTE — Therapy (Signed)
[place] MAIN [facility] SERVICES [address] [place], [place], [address] Phone: [phone]   Fax:  [phone]  Occupational Therapy Treatment  Patient Details  Name: Sherry [name] MRN: [id number] Date of Birth: 1934/10/21 No data recorded  Encounter Date: 09/24/2020   OT End of Session - 09/24/20 1414     Visit Number 58    Number of Visits 20    Date for OT Re-Evaluation 10/22/20    Authorization Time Period Progress report period starting 09/08/2020    Activity Tolerance Patient tolerated treatment well    Behavior During Therapy [facility] for tasks assessed/performed             Past Medical History:  Diagnosis Date   Acute blood loss anemia    Arthritis    Cancer ([place])    skin   Central cord syndrome at C6 level of cervical spinal cord ([place]) 11/29/2017   Hypertension    Protein-calorie malnutrition, severe ([place]) 01/24/2018   S/P insertion of IVC (inferior vena caval) filter 01/24/2018   Tetraparesis ([place])     Past Surgical History:  Procedure Laterality Date   ANTERIOR CERVICAL DECOMP/DISCECTOMY FUSION N/A 11/29/2017   Procedure: Cervical five-six, six-seven Anterior Cervical Decompression Fusion;  Surgeon: Judith Part, MD;  Location: [facility];  Service: Neurosurgery;  Laterality: N/A;  Cervical five-six, six-seven Anterior Cervical Decompression Fusion   CATARACT EXTRACTION     IR IVC FILTER PLMT / S&I /IMG GUID/MOD SED  12/08/2017    There were no vitals filed for this visit.   Subjective Assessment - 09/24/20 1413     Subjective  Patient reports doing well today    Patient is accompanied by: Family member    Pertinent History Patient s/p fall November 29, 2017 resulting in diagnosis of central cord syndrome at C 6 level.  She has had therapy in multiple venues but with recent move to [facility], therapy staff recommended she seek outpatient therapy for her needs.    Patient Stated Goals Patient would like to be able  to move better in bed, stand and perform self care tasks.    Currently in Pain? No/denies              OT Treatment   Pt. tolerated bilateral shoulder flexion, extension, abduction, elbow flexion, extension forearm supination AAROM/AROM. PROM bilateral wrist extension, digit MP, PIP, and DIP flexion, and extension, thumb radial, and palmar abduction in conjunction with moist heat. Pt. Worked on BUE strengthening, and reciprocal motion using the UBE while sitting for 58mn. with no resistance. Constant monitoring was provided Pt. worked on reaching with abduction reaching out to the far left, and the far right using the Saebo horizontal ring tower.  Patient worked reaching out for [employer] ring with the left UE, and passing it to the right then reaching out with the right to place them on the 2nd and third horizontal rungs. Pt. was able to perform the task more freely today. Pt. worked on Bilateral UE reaching, and ROM in order to improve functional reaching for ADL, IADL tasks, and haircare.   Pt. continues to make progress overall, and is improving with BUE reaching in preparation for improving overhead ADLs, and haircare. Pt. continues to tolerate ROM, moist heat modality, and functional reaching without pain. Pt. was able  to reach out to the far left, and far right to move the Saebo rings  through 3 horizonta;l rungs of progressively increasing heights with  both the right, and left UEs. Pt. Required less time to complete, and was able to reach further with no leaning noted today during reaching tasks. Pt. Was able to tolerate adding the UBE for 3 minutes. Pt. tolerated ROM well without reports of pain, or discomfort. Pt. continues to benefit from working on improving ROM in order to work towards increasing engagement of her bilateral hands during ADLs, and IADL tasks.                         OT Education - 09/24/20 1414     Education Details Bilateral upper extremity range  of motion with functional reaching to far  right and left    Person(s) Educated Patient;Caregiver(s)    Methods Explanation;Demonstration    Comprehension Verbalized understanding;Need further instruction                 OT Long Term Goals - 09/08/20 1100       OT LONG TERM GOAL #1   Title Patient and caregiver will demonstrate understanding of home exercise program for ROM.    Baseline Pt. continues to have a restorative aide rehab aide assist her wih exercises at [facility]. 8/8: Independently completes/directs HEP mulitple times/week    Time 12    Period Weeks    Status Partially Met    Target Date 10/22/20      OT LONG TERM GOAL #4   Title Patient will demonstrate improved composite finger flexion to hold deodorant to apply to underarms.    Baseline Pt. continues to present with improving finger flexion of R hand for a partial gross grasp, but unable to grasp and hold items. pt. conitnues to present with limited digit flexion of L hand. Pt. has active left thumb abduction. 8/8: holds cup with 2 hands, continues to have limited composite finger flexion L worse than R    Time 12    Period Weeks    Status On-going    Target Date 10/22/20      OT LONG TERM GOAL #6   Title Patient will increase right UE ROM to comb the back of her hair with modified independence.    Baseline 07/30/2020: Pt. is now able to comb the back of her hair with a pic, however continues to work on completing the task efficiently. 8/8: Achieves with great effort, has assistance for efficiancy    Time 12    Period Weeks    Status Partially Met      OT LONG TERM GOAL #7   Title Pt. will independently, and efficiently send text messages on her phone.    Baseline 07/30/2020: Pt. continues to present with limited BUE strength, and [facility] skills. 8/8: independtly drafts text messages, continues to work on accuracy and efficiancy 2/2 limited [facility]    Time 12    Period Weeks    Status Partially Met    Target Date  10/22/20      OT LONG TERM GOAL #8   Title Pt. will write, and sign her name with 100% legibility, and modified independence.    Baseline Pt. continues to  consistently filling out her daily menu, and writing for crossword puzzles. Pt. is able to maintain grasp on a wide width pen. [place] to work on Media planner. 8/8: writes daily, continues to work on improving speed    Time 12    Period Weeks    Status Partially Met    Target Date  10/22/20                   Plan - 09/24/20 1415     Clinical Impression Statement Pt. continues to make progress overall, and is improving with BUE reaching in preparation for improving overhead ADLs, and haircare. Pt. continues to tolerate ROM, moist heat modality, and functional reaching without pain. Pt. was able  to reach out to the far left, and far right to move the Saebo rings  through 3 horizonta;l rungs of progressively increasing heights with both the right, and left UEs. Pt. Required less time to complete, and was able to reach further with no leaning noted today during reaching tasks. Pt. Was able to tolerate adding the UBE for 3 minutes. Pt. tolerated ROM well without reports of pain, or discomfort. Pt. continues to benefit from working on improving ROM in order to work towards increasing engagement of her bilateral hands during ADLs, and IADL tasks.        OT Occupational Profile and History Detailed Assessment- Review of Records and additional review of physical, cognitive, psychosocial history related to current functional performance    Occupational performance deficits (Please refer to evaluation for details): ADL's;IADL's;Leisure;Social Participation    Body Structure / Function / Physical Skills ADL;Continence;Dexterity;Flexibility;Strength;ROM;Balance;Coordination;FMC;IADL;Endurance;UE functional use;Decreased knowledge of use of DME;GMC    Psychosocial Skills Environmental  Adaptations;Habits;Routines and Behaviors    Rehab  Potential Fair    Clinical Decision Making Several treatment options, min-mod task modification necessary    Comorbidities Affecting Occupational Performance: Presence of comorbidities impacting occupational performance    Comorbidities impacting occupational performance description: contractures of bilateral hands, dependent transfers,    Modification or Assistance to Complete Evaluation  No modification of tasks or assist necessary to complete eval    OT Frequency 2x / week    OT Duration 12 weeks    OT Treatment/Interventions Self-care/ADL training;Cryotherapy;Therapeutic exercise;DME and/or AE instruction;Balance training;Neuromuscular education;Manual Therapy;Splinting;Moist Heat;Passive range of motion;Therapeutic activities;Patient/family education    Plan continue to progress ROM of digits, wrists and shoulders as it pertains to completion of ADL tasks that pt values.    Consulted and Agree with Plan of Care Patient             Patient will benefit from skilled therapeutic intervention in order to improve the following deficits and impairments:   Body Structure / Function / Physical Skills: ADL, Continence, Dexterity, Flexibility, Strength, ROM, Balance, Coordination, FMC, IADL, Endurance, UE functional use, Decreased knowledge of use of DME, GMC   Psychosocial Skills: Environmental  Adaptations, Habits, Routines and Behaviors   Visit Diagnosis: Muscle weakness (generalized)  Other lack of coordination    Problem List Patient Active Problem List   Diagnosis Date Noted   Acute appendicitis with appendiceal abscess 05/30/2018   Hypocalcemia 02/15/2018   Dysuria 02/15/2018   Bilateral lower extremity edema 02/11/2018   Vaginal yeast infection 01/30/2018   Chest tightness 01/27/2018   Healthcare-associated pneumonia 01/25/2018   Pleural effusion, not elsewhere classified 01/25/2018   Acute deep vein thrombosis (DVT) of left lower extremity ([place]) 01/24/2018   Acute deep  vein thrombosis (DVT) of right lower extremity ([place]) 01/24/2018   Chronic allergic rhinitis 01/24/2018   Depression with anxiety 01/24/2018   UTI due to Klebsiella species 01/24/2018   Protein-calorie malnutrition, severe ([place]) 01/24/2018   S/P insertion of IVC (inferior vena caval) filter 01/24/2018   Reactive depression    Hypokalemia    Leukocytosis    Essential hypertension  Trauma    Tetraparesis ([facility])    Neuropathic pain    Neurogenic bowel    Neurogenic bladder    Benign essential HTN    Acute blood loss anemia    Central cord syndrome at C6 level of cervical spinal cord ([place]) 11/29/2017   Central cord syndrome ([place]) 11/29/2017    Harrel Carina, MS, OTR/L 09/24/2020, 2:16 PM  [place] MAIN [facility] SERVICES [address] [place], [place], [address] Phone: [phone]   Fax:  [phone]  Name: MEA OZGA MRN: [id number] Date of Birth: [birth year]

## 2020-09-29 ENCOUNTER — Ambulatory Visit: Payer: Medicare HMO | Admitting: Occupational Therapy

## 2020-09-29 ENCOUNTER — Ambulatory Visit: Payer: Medicare HMO | Admitting: Physical Therapy

## 2020-09-29 ENCOUNTER — Other Ambulatory Visit: Payer: Self-pay

## 2020-09-29 ENCOUNTER — Encounter: Payer: Self-pay | Admitting: Occupational Therapy

## 2020-09-29 DIAGNOSIS — R140 Abdominal distension (gaseous): Secondary | ICD-10-CM

## 2020-09-29 DIAGNOSIS — R278 Other lack of coordination: Secondary | ICD-10-CM

## 2020-09-29 DIAGNOSIS — R262 Difficulty in walking, not elsewhere classified: Secondary | ICD-10-CM

## 2020-09-29 DIAGNOSIS — O2230 Deep phlebothrombosis in pregnancy, unspecified trimester: Secondary | ICD-10-CM

## 2020-09-29 DIAGNOSIS — R269 Unspecified abnormalities of gait and mobility: Secondary | ICD-10-CM

## 2020-09-29 DIAGNOSIS — R2689 Other abnormalities of gait and mobility: Secondary | ICD-10-CM

## 2020-09-29 DIAGNOSIS — R2681 Unsteadiness on feet: Secondary | ICD-10-CM

## 2020-09-29 DIAGNOSIS — M6281 Muscle weakness (generalized): Secondary | ICD-10-CM

## 2020-09-29 DIAGNOSIS — S14129S Central cord syndrome at unspecified level of cervical spinal cord, sequela: Secondary | ICD-10-CM

## 2020-09-29 NOTE — Therapy (Signed)
[place] MAIN [facility] SERVICES [address] [place], [place], [address] Phone: [phone]   Fax:  [phone]  Occupational Therapy Treatment  Patient Details  Name: Sherry [name] MRN: [id number] Date of Birth: 1934/05/11 No data recorded  Encounter Date: 09/29/2020   OT End of Session - 09/29/20 1133     Visit Number 65    Number of Visits 56    Date for OT Re-Evaluation 10/22/20    Authorization Time Period Progress report period starting 09/08/2020    OT Start Time 44    OT Stop Time 1145    OT Time Calculation (min) 45 min    Equipment Utilized During Treatment moist heat    Activity Tolerance Patient tolerated treatment well    Behavior During Therapy [facility] for tasks assessed/performed             Past Medical History:  Diagnosis Date   Acute blood loss anemia    Arthritis    Cancer ([place])    skin   Central cord syndrome at C6 level of cervical spinal cord ([place]) 11/29/2017   Hypertension    Protein-calorie malnutrition, severe ([place]) 01/24/2018   S/P insertion of IVC (inferior vena caval) filter 01/24/2018   Tetraparesis ([place])     Past Surgical History:  Procedure Laterality Date   ANTERIOR CERVICAL DECOMP/DISCECTOMY FUSION N/A 11/29/2017   Procedure: Cervical five-six, six-seven Anterior Cervical Decompression Fusion;  Surgeon: Judith Part, MD;  Location: [place];  Service: Neurosurgery;  Laterality: N/A;  Cervical five-six, six-seven Anterior Cervical Decompression Fusion   CATARACT EXTRACTION     IR IVC FILTER PLMT / S&I /IMG GUID/MOD SED  12/08/2017    There were no vitals filed for this visit.   Subjective Assessment - 09/29/20 1133     Subjective  Patient reports doing well today    Patient is accompanied by: Family member    Pertinent History Patient s/p fall November 29, 2017 resulting in diagnosis of central cord syndrome at C 6 level.  She has had therapy in multiple venues but with recent move to  [facility], therapy staff recommended she seek outpatient therapy for her needs.    Currently in Pain? No/denies            OT Treatment   Pt. tolerated bilateral shoulder flexion, extension, abduction, elbow flexion, extension forearm supination AAROM/AROM. PROM bilateral wrist extension, digit MP, PIP, and DIP flexion, and extension, thumb radial, and palmar abduction in conjunction with moist heat. Pt. Worked on BUE strengthening, and reciprocal motion using the UBE while sitting for 26mn. with no resistance. Constant monitoring was provided. Pt. worked on reaching with abduction reaching out to the far left, and the far right using the Saebo horizontal ring tower.  Patient worked reaching out for [employer] ring with the left UE, and passing it to the right then reaching out with the right to place them on the 2nd and third horizontal rungs. Pt. was able to perform the task more freely today. Pt. worked on Bilateral UE reaching, and ROM in order to improve functional reaching for ADL, IADL tasks, and haircare.   Pt. continues to make progress overall, and is improving with BUE reaching in preparation for improving overhead ADLs, and haircare. Pt. continues to tolerate ROM, moist heat modality, and functional reaching without pain. Pt. was able  to reach out to the far left, and far right to move the Saebo rings  through 3  horizontal rungs of progressively increasing heights with both the right, and left UEs. Pt. required less time to complete, and was able to reach further with no leaning noted today during reaching tasks. Pt. was able to tolerate adding the UBE for 3 minutes. Pt. tolerated ROM well without reports of pain, or discomfort. Pt. continues to benefit from working on improving ROM in order to work towards increasing engagement of her bilateral hands during ADLs, and IADL tasks.                             OT Education - 09/29/20 1133     Education Details  Bilateral upper extremity range of motion with functional reaching to far  right and left    Person(s) Educated Patient;Caregiver(s)    Methods Explanation;Demonstration    Comprehension Verbalized understanding;Need further instruction                 OT Long Term Goals - 09/08/20 1100       OT LONG TERM GOAL #1   Title Patient and caregiver will demonstrate understanding of home exercise program for ROM.    Baseline Pt. continues to have a restorative aide rehab aide assist her wih exercises at [facility]. 8/8: Independently completes/directs HEP mulitple times/week    Time 12    Period Weeks    Status Partially Met    Target Date 10/22/20      OT LONG TERM GOAL #4   Title Patient will demonstrate improved composite finger flexion to hold deodorant to apply to underarms.    Baseline Pt. continues to present with improving finger flexion of R hand for a partial gross grasp, but unable to grasp and hold items. pt. conitnues to present with limited digit flexion of L hand. Pt. has active left thumb abduction. 8/8: holds cup with 2 hands, continues to have limited composite finger flexion L worse than R    Time 12    Period Weeks    Status On-going    Target Date 10/22/20      OT LONG TERM GOAL #6   Title Patient will increase right UE ROM to comb the back of her hair with modified independence.    Baseline 07/30/2020: Pt. is now able to comb the back of her hair with a pic, however continues to work on completing the task efficiently. 8/8: Achieves with great effort, has assistance for efficiancy    Time 12    Period Weeks    Status Partially Met      OT LONG TERM GOAL #7   Title Pt. will independently, and efficiently send text messages on her phone.    Baseline 07/30/2020: Pt. continues to present with limited BUE strength, and [facility] skills. 8/8: independtly drafts text messages, continues to work on accuracy and efficiancy 2/2 limited [facility]    Time 12    Period Weeks     Status Partially Met    Target Date 10/22/20      OT LONG TERM GOAL #8   Title Pt. will write, and sign her name with 100% legibility, and modified independence.    Baseline Pt. continues to  consistently filling out her daily menu, and writing for crossword puzzles. Pt. is able to maintain grasp on a wide width pen. [place] to work on Media planner. 8/8: writes daily, continues to work on improving speed    Time 12    Period Weeks  Status Partially Met    Target Date 10/22/20                   Plan - 09/29/20 1134     Clinical Impression Statement Pt. continues to make progress overall, and is improving with BUE reaching in preparation for improving overhead ADLs, and haircare. Pt. continues to tolerate ROM, moist heat modality, and functional reaching without pain. Pt. was able  to reach out to the far left, and far right to move the Sa[employer] rings  through 3 horizontal rungs of progressively increasing heights with both the right, and left UEs. Pt. required less time to complete, and was able to reach further with no leaning noted today during reaching tasks. Pt. was able to tolerate the UBE for increased time to 8 minutes. Pt. tolerated ROM well without reports of pain, or discomfort. Pt. continues to benefit from working on improving ROM in order to work towards increasing engagement of her bilateral hands during ADLs, and IADL tasks.      OT Occupational Profile and History Detailed Assessment- Review of Records and additional review of physical, cognitive, psychosocial history related to current functional performance    Occupational performance deficits (Please refer to evaluation for details): ADL's;IADL's;Leisure;Social Participation    Body Structure / Function / Physical Skills ADL;Continence;Dexterity;Flexibility;Strength;ROM;Balance;Coordination;FMC;IADL;Endurance;UE functional use;Decreased knowledge of use of DME;GMC    Psychosocial Skills Environmental   Adaptations;Habits;Routines and Behaviors    Rehab Potential Fair    Clinical Decision Making Several treatment options, min-mod task modification necessary    Comorbidities Affecting Occupational Performance: Presence of comorbidities impacting occupational performance    Comorbidities impacting occupational performance description: contractures of bilateral hands, dependent transfers,    Modification or Assistance to Complete Evaluation  No modification of tasks or assist necessary to complete eval    OT Frequency 2x / week    OT Duration 12 weeks    OT Treatment/Interventions Self-care/ADL training;Cryotherapy;Therapeutic exercise;DME and/or AE instruction;Balance training;Neuromuscular education;Manual Therapy;Splinting;Moist Heat;Passive range of motion;Therapeutic activities;Patient/family education    Plan continue to progress ROM of digits, wrists and shoulders as it pertains to completion of ADL tasks that pt values.    Consulted and Agree with Plan of Care Patient             Patient will benefit from skilled therapeutic intervention in order to improve the following deficits and impairments:   Body Structure / Function / Physical Skills: ADL, Continence, Dexterity, Flexibility, Strength, ROM, Balance, Coordination, FMC, IADL, Endurance, UE functional use, Decreased knowledge of use of DME, GMC   Psychosocial Skills: Environmental  Adaptations, Habits, Routines and Behaviors   Visit Diagnosis: Muscle weakness (generalized)  Other lack of coordination    Problem List Patient Active Problem List   Diagnosis Date Noted   Acute appendicitis with appendiceal abscess 05/30/2018   Hypocalcemia 02/15/2018   Dysuria 02/15/2018   Bilateral lower extremity edema 02/11/2018   Vaginal yeast infection 01/30/2018   Chest tightness 01/27/2018   Healthcare-associated pneumonia 01/25/2018   Pleural effusion, not elsewhere classified 01/25/2018   Acute deep vein thrombosis (DVT) of  left lower extremity ([place]) 01/24/2018   Acute deep vein thrombosis (DVT) of right lower extremity ([place]) 01/24/2018   Chronic allergic rhinitis 01/24/2018   Depression with anxiety 01/24/2018   UTI due to Klebsiella species 01/24/2018   Protein-calorie malnutrition, severe ([place]) 01/24/2018   S/P insertion of IVC (inferior vena caval) filter 01/24/2018   Reactive depression    Hypokalemia    Leukocytosis  Essential hypertension    Trauma    Tetraparesis (HCC)    Neuropathic pain    Neurogenic bowel    Neurogenic bladder    Benign essential HTN    Acute blood loss anemia    Central cord syndrome at C6 level of cervical spinal cord ([facility]) 11/29/2017   Central cord syndrome [facility]) 11/29/2017    Harrel Carina, MS, OTR/L 09/29/2020, 11:35 AM  [place] MAIN [facility] SERVICES [place], [place], [address] Phone: [phone]   Fax:  [phone]  Name: Sherry [name] MRN: [id number] Date of Birth: 1934-04-27

## 2020-09-29 NOTE — Therapy (Signed)
[place] MAIN [facility] SERVICES [address] [place], [place], [address] Phone: [phone]   Fax:  [phone]  Physical Therapy Treatment  Patient Details  Name: Sherry [name] MRN: [id number] Date of Birth: [birth year] No data recorded  Encounter Date: 09/29/2020   PT End of Session - 09/29/20 1309     Visit Number 89    Number of Visits 104    Date for PT Re-Evaluation 09/24/20    Authorization Type [employer]na medicare FOTO performed by PT on eval (7/20), score 12, Progress note on 03/10/2020; PN on 08/20/2020    Authorization Time Period Recert 2/0/2542- 08/07/2374; PN on 03/11/3149; Recert 7/61/6073- 71/07/2692    PT Start Time 1147    PT Stop Time 1228    PT Time Calculation (min) 41 min    Equipment Utilized During Treatment Gait belt    Activity Tolerance Patient tolerated treatment well;Patient limited by fatigue    Behavior During Therapy WFL for tasks assessed/performed             Past Medical History:  Diagnosis Date   Acute blood loss anemia    Arthritis    Cancer ([place])    skin   Central cord syndrome at C6 level of cervical spinal cord ([place]) 11/29/2017   Hypertension    Protein-calorie malnutrition, severe ([place]) 01/24/2018   S/P insertion of IVC (inferior vena caval) filter 01/24/2018   Tetraparesis ([place])     Past Surgical History:  Procedure Laterality Date   ANTERIOR CERVICAL DECOMP/DISCECTOMY FUSION N/A 11/29/2017   Procedure: Cervical five-six, six-seven Anterior Cervical Decompression Fusion;  Surgeon: Judith Part, MD;  Location: [place];  Service: Neurosurgery;  Laterality: N/A;  Cervical five-six, six-seven Anterior Cervical Decompression Fusion   CATARACT EXTRACTION     IR IVC FILTER PLMT / S&I /IMG GUID/MOD SED  12/08/2017    There were no vitals filed for this visit.   Subjective Assessment - 09/29/20 1243     Subjective Patient reports doing well today. No new concerns. Denies pain.    Pertinent  History Pt is an 86 yo female that fell in 2019, fractured vertebrae in her neck and in her low back per family. 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 After hospital stay, pt discharged to [facility] for ~1 month, experienced severe UTI as well as bilateral DVT (IVC filter placed). Discharged to [facility], stayed for about a year, and then moved to [facility] in April 2021. Was receiving PT, but per family facility reported that did not have adequate equipment to maximize PT for her. Pt until about 1 month ago was practicing sit to stand transfers with 1-2 people, and working on static standing. Has a brace for L foot due to PF. Pt currently needs assistance with all ADLs (able to assist with donning/doffing her shirt), bed baths, and needs a hoyer lift for transfers. Able to drive power wheelchair without assistance.    Limitations Standing;Walking;House hold activities;Lifting    How long can you sit comfortably? NA    How long can you stand comfortably? unable    How long can you walk comfortably? unable    Patient Stated Goals "to stand up and walk"    Currently in Pain? No/denies             Interventions:    Transfer Training:  Reviewed, instructed and assisted patient and paid caregiver- [name] in sit to stand type mechanical lift [facility]).  Transferred patient  PWC <> mat table for practice with caregiver with idea to begin using this method for transfers rather than a dependent hoyer. This would allow for more frequent transfers to various surfaces.   Lateral scoot transfer for positioning on mat table - MOD A x2 for lift and lateral translation. Pt maintained trunk control. 2 scoots. Pt and caregiver educated on head-hips relationship.    Sitting EOM: LAQ with resisted HS curl x 10 BLE; A-P trunk, support during trunk extension to prevent posterior LOB. Significant trunk extension demonstrated. x15 each direction; Multidirectional trunk perturbations for postural  control, SUP by caregiver for safety, 2 minutes;  Standing hip extensions x10 reps with support of SABINA (discomfort due to straps).      Clinical Impression: Patient demonstrates excellent motivation in today's session. The [facility] mechanical lift was reviewed and transfer performed by caretaker and assist of PT. PT attempted to troubleshoot strap placement for increased comfort during transfer; will continue trying alternative methods. Pt sat EOM for 20 minutes during LE strength training and core stabilization. She demonstrates good core strength by remaining steady sitting EOM with no hand or feet support (mat elevated). She completed the session with 10 hip extensions while being supported by [place] lift. Good muscular recruitment to perform hip extension - limited by discomfort of straps. Patient will benefit from continued PT services to increase strength, mobility and consistency of performance to improve patient's quality of life.      PT Short Term Goals - 03/10/20 2211       PT SHORT TERM GOAL #1   Title The patient will perform initial HEP with minimum assistance in order to improve strength and function.    Baseline Patient demonstrating independence with initial HEP as of 03/10/2020 with no questions or difficulty.    Time 4    Period Weeks    Status Achieved    Target Date 02/11/20               PT Long Term Goals - 09/24/20 1427       PT LONG TERM GOAL #1   Title The patient will be compliant with finalized HEP with minimum assistance in preparation for self management and maintenance of condition.    Baseline 03/10/2020- Patient familiar with initial HEP and will keep goal active as HEP becomes more progressive including possible transfers and standing activities. 04/07/2020- Patient continues to report compliance with LE strengthening home exercises without questions or concerns. 04/21/2020- Patient able to verbalize and demonstrate good understanding of current HEP  including some supine and seated LE strengthening exercises. Reviewed and patient performed 10 reps today with minimal cueing. Will keep goal active to progress HEP as appropriate. 05/28/2020- patient reports continues to perform LE strengthening as able and some help from caregiver as able. No changes at this time to home program as patient is limited to supine/seated therex. 07/02/2020- Patient continues to report compliance with current supine and seated LE strengtheing home program and states no questions or concerns regarding her plan.    Time 12    Period Weeks    Status Achieved      PT LONG TERM GOAL #2   Title The patient will demonstrate at least 10 point improvement on FOTO score indicating an improved ability to perform functional activities.    Baseline on eval (/720) score 12; 10/22/19: 12; 12/10/19: 18, 12/13: 18: 04/07/2020- Will assess next visit. 07/02/2020- Will obtain next visit. 09/24/2020= Will obtain next visit.  Time 12    Period Weeks    Status On-going    Target Date 12/17/20      PT LONG TERM GOAL #3   Title The patient will demonstrate lateral scooting  for assistance with transfers with minA to maximize independence.    Baseline 10/22/19: Pt requires modA+1 for lateral scooting; 12/10/19: modA+1, 12/13: modA-maxA, 12/27: modA-maxA, 03/10/2020- ModA-MaxA- increased verbal cues and visual demonstration. 04/07/2020- Continued Max assist to perform forward/lateral scoot. 04/21/2020- Patient was able to demo slight lateral scoot with CGA approx 12 in then fatigued requiring max assist- she is able to scoot forward by leaning without significant issues. 05/28/2020- While sitting in chair- patient able to perform forward and lateral scoot with VC/visual demo and increased time allowed- CGA today but not consistent yet- will keep goal active at this time to continue to focus on strengthening and improving technique. 07/02/2020- Patient able to demonstrate minimal ability to laterally shift weigh  on mat table requiring max assist yet is able to forward scoot out to edge of mat table with min Assist. 08/20/2020 - Patient was able to demonstrate minimal lateral scooting at edge of mat while holding feet still so they did not slip on tile surface. She continues to be able to demonstrate scoot forward and uses tilt option to recline to position safely back into seat of power wheelchair. 09/25/2020- Patient able to sit on mat and minimally scoot laterally but requires min/mod A for efficient scooting. Will end goal at this time due to plateau in progress with scooting over extended time.    Time 12    Period Weeks    Status Not Met    Target Date 12/17/20      PT LONG TERM GOAL #4   Title The patient will demonstrate a squat pivot transfer with minimum assistance to maximize independence and mobility.    Baseline 10/22/19: unable to safely attempt at this time; 12/10/19: unable to safely attempt at this time, 01/14/20: unable to safety attempt at this time. 03/10/2020- Patient able to perform Stand pivot transfer with Maximal assist. 04/07/2020- Patient continues to require Max assist with sit to stand- will assist SPT next visit. 04/21/2020- Patient presents with Max assist to perform Sit to stand Transfer- unable to pivot or move Left LE well without difficulty. 07/02/2020= Max assist with SPT and minimal ability to pivot. 7/202/2022-Patient able to stand with minimal assist this visit (as good as CGA last visit) and able to move right foot to pivot and performing with moderate assist today. 09/24/2020- Patient able to perform sit to stand with Min assist from lift chair. Patient has demo inconsistency and will keep goal active.    Time 12    Period Weeks    Status On-going    Target Date 12/17/20      PT LONG TERM GOAL #5   Title The patient will demonstrate sitting without UE support for 2-5 minutes at EOB to improve participation and maximize independence with ADLs.    Baseline 10/22/19: Pt able to sit  unsupported for at least 2 minutes at edge of bed. 04/21/2020- Patient continues to demo good sitting at edge of mat > 5 miin without difficulty or back support.    Time 12    Period Weeks    Status Achieved      Additional Long Term Goals   Additional Long Term Goals Yes      PT LONG TERM GOAL #6   Title Patient will demonstrate  improved functional LE strength as seen by consistent ability to stand > 2 min (3 out of 4 trials) with Mod/Max A for improved LE strength with transfers.    Baseline 05/28/2020- Patient able to inconsistent stand 1-2 min right now in //bars with Mod/Max A. 07/02/2020- Patient was able to progress last 2 visit to standing using bilateral platform attachment between 1-2 min which is a progression from the parallel bars. 08/20/2020- Patient performed static standing with min- mod A using bilateral platform attachment on walker for 6 min 30 sec today with assist for trunk control and intermittent verbal cues for posture. 09/24/2020- Patient has consistently been able to stand >2 min in // bars and when using bilateral Platform walker.    Time 12    Period Weeks    Status Achieved      PT LONG TERM GOAL #7   Title Patient/caregiver will demonstrate assisted transfer using [place] consistently Independently for improved ability to transfer at home from lift chair to bed.    Baseline 09/24/2020- Patient requires hoyer lift (Dependent) for safe transfers.    Time 12    Period Weeks    Status New    Target Date 12/17/20                   Plan - 09/29/20 1309     Clinical Impression Statement Patient demonstrates excellent motivation in today's session. The [facility] mechanical lift was reviewed and transfer performed by caretaker and assist of PT. PT attempted to troubleshoot strap placement for increased comfort during transfer; will continue trying alternative methods. Pt sat EOM for 20 minutes during LE strength training and core stabilization. She demonstrates good  core strength by remaining steady sitting EOM with no hand or feet support (mat elevated). She completed the session with 10 hip extensions while being supported by [place] lift. Good muscular recruitment to perform hip extension - limited by discomfort of straps. Patient will benefit from continued PT services to increase strength, mobility and consistency of performance to improve patient's quality of life.    Personal Factors and Comorbidities Age;Time since onset of injury/illness/exacerbation;Comorbidity 3+;Fitness    Comorbidities HTN, quadriparesis, history of DVT, neurogenic bladder    Examination-Activity Limitations Bathing;Hygiene/Grooming;Squat;Bed Mobility;Lift;Bend;Stand;Engineer, manufacturing;Toileting;Self Feeding;Transfers;Continence;Sit;Dressing;Sleep;Carry    Examination-Participation Restrictions Church;Laundry;Cleaning;Medication Management;Community Activity;Meal Prep;Interpersonal Relationship    Stability/Clinical Decision Making Evolving/Moderate complexity    Rehab Potential Fair    PT Frequency 2x / week    PT Duration 12 weeks    PT Treatment/Interventions ADLs/Self Care Home Management;Electrical Stimulation;Therapeutic activities;Wheelchair mobility training;Vasopneumatic Device;Joint Manipulations;Vestibular;Passive range of motion;Patient/family education;Therapeutic exercise;DME Instruction;Biofeedback;Aquatic Therapy;Moist Heat;Gait training;Balance training;Orthotic Fit/Training;Dry needling;Energy conservation;Taping;Splinting;Neuromuscular re-education;Cryotherapy;Ultrasound;Functional mobility training    PT Next Visit Plan Continue with progressive Standing/transfer training.    PT Home Exercise Plan no changes    Consulted and Agree with Plan of Care Patient             Patient will benefit from skilled therapeutic intervention in order to improve the following deficits and impairments:  Abnormal gait, Decreased balance, Decreased endurance,  Decreased mobility, Difficulty walking, Hypomobility, Impaired sensation, Decreased range of motion, Improper body mechanics, Impaired perceived functional ability, Decreased activity tolerance, Decreased knowledge of use of DME, Decreased safety awareness, Decreased strength, Impaired flexibility, Impaired UE functional use, Postural dysfunction  Visit Diagnosis: Abnormality of gait and mobility  Difficulty in walking, not elsewhere classified  Muscle weakness (generalized)  Unsteadiness on feet  Other lack of coordination  Other abnormalities of gait  and mobility  Abdominal distention  Central cord syndrome, sequela (HCC)  DVT (deep vein thrombosis) in pregnancy     Problem List Patient Active Problem List   Diagnosis Date Noted   Acute appendicitis with appendiceal abscess 05/30/2018   Hypocalcemia 02/15/2018   Dysuria 02/15/2018   Bilateral lower extremity edema 02/11/2018   Vaginal yeast infection 01/30/2018   Chest tightness 01/27/2018   Healthcare-associated pneumonia 01/25/2018   Pleural effusion, not elsewhere classified 01/25/2018   Acute deep vein thrombosis (DVT) of left lower extremity ([place]) 01/24/2018   Acute deep vein thrombosis (DVT) of right lower extremity ([place]) 01/24/2018   Chronic allergic rhinitis 01/24/2018   Depression with anxiety 01/24/2018   UTI due to Klebsiella species 01/24/2018   Protein-calorie malnutrition, severe ([place]) 01/24/2018   S/P insertion of IVC (inferior vena caval) filter 01/24/2018   Reactive depression    Hypokalemia    Leukocytosis    Essential hypertension    Trauma    Tetraparesis (HCC)    Neuropathic pain    Neurogenic bowel    Neurogenic bladder    Benign essential HTN    Acute blood loss anemia    Central cord syndrome at C6 level of cervical spinal cord ([place]) 11/29/2017   Central cord syndrome ([place]) 11/29/2017     Patrina Levering PT, [place] [address] [place], [place], [address] Phone: [phone]   Fax:  [phone]  Name: ARAEYA LAMB MRN: [id number] Date of Birth: [birth year]

## 2020-10-01 ENCOUNTER — Ambulatory Visit: Payer: Medicare HMO

## 2020-10-01 ENCOUNTER — Encounter: Payer: Self-pay | Admitting: Occupational Therapy

## 2020-10-01 ENCOUNTER — Ambulatory Visit: Payer: Medicare HMO | Admitting: Occupational Therapy

## 2020-10-01 ENCOUNTER — Other Ambulatory Visit: Payer: Self-pay

## 2020-10-01 DIAGNOSIS — R278 Other lack of coordination: Secondary | ICD-10-CM

## 2020-10-01 DIAGNOSIS — M6281 Muscle weakness (generalized): Secondary | ICD-10-CM

## 2020-10-01 DIAGNOSIS — R262 Difficulty in walking, not elsewhere classified: Secondary | ICD-10-CM

## 2020-10-01 DIAGNOSIS — R2681 Unsteadiness on feet: Secondary | ICD-10-CM

## 2020-10-01 DIAGNOSIS — R269 Unspecified abnormalities of gait and mobility: Secondary | ICD-10-CM

## 2020-10-01 NOTE — Therapy (Signed)
[place] MAIN [facility] SERVICES [address] [place], [place], [address] Phone: [phone]   Fax:  [phone]  Occupational Therapy Treatment  Patient Details  Name: Sherry [name] MRN: [id number] Date of Birth: 11/23/1934 No data recorded  Encounter Date: 10/01/2020   OT End of Session - 10/01/20 1201     Visit Number 76    Number of Visits 90    Date for OT Re-Evaluation 10/22/20    Authorization Time Period Progress report period starting 09/08/2020    OT Start Time 1100    OT Stop Time 1145    OT Time Calculation (min) 45 min    Activity Tolerance Patient tolerated treatment well    Behavior During Therapy [facility] for tasks assessed/performed             Past Medical History:  Diagnosis Date   Acute blood loss anemia    Arthritis    Cancer ([place])    skin   Central cord syndrome at C6 level of cervical spinal cord ([place]) 11/29/2017   Hypertension    Protein-calorie malnutrition, severe ([place]) 01/24/2018   S/P insertion of IVC (inferior vena caval) filter 01/24/2018   Tetraparesis ([place])     Past Surgical History:  Procedure Laterality Date   ANTERIOR CERVICAL DECOMP/DISCECTOMY FUSION N/A 11/29/2017   Procedure: Cervical five-six, six-seven Anterior Cervical Decompression Fusion;  Surgeon: Judith Part, MD;  Location: [place];  Service: Neurosurgery;  Laterality: N/A;  Cervical five-six, six-seven Anterior Cervical Decompression Fusion   CATARACT EXTRACTION     IR IVC FILTER PLMT / S&I /IMG GUID/MOD SED  12/08/2017    There were no vitals filed for this visit.   Subjective Assessment - 10/01/20 1201     Subjective  Patient reports that she is doing well today    Patient is accompanied by: Family member    Pertinent History Patient s/p fall November 29, 2017 resulting in diagnosis of central cord syndrome at C 6 level.  She has had therapy in multiple venues but with recent move to [facility], therapy staff recommended she  seek outpatient therapy for her needs.    Patient Stated Goals Patient would like to be able to move better in bed, stand and perform self care tasks.    Currently in Pain? No/denies            OT TREATMENT     Pt. tolerated bilateral shoulder flexion, extension, abduction, elbow flexion, extension forearm supination AAROM/AROM. PROM bilateral wrist extension, digit MP, PIP, and DIP flexion, and extension, thumb radial, and palmar abduction in conjunction with moist heat. Pt. Worked on BUE strengthening, and reciprocal motion using the UBE while sitting for 38mn. with no resistance. Constant monitoring was provided. Pt. worked on reaching with abduction reaching out to the far left, and the far right using the [place]o horizontal ring tower.  Patient worked reaching out for the [employer]with the left UE, and passing it to the right then reaching out with the right to place them on the 1st and 2nd horizontal rungs. Pt. was able to perform the task more freely today. Pt. worked on Bilateral UE reaching, and ROM in order to improve functional reaching for ADL, IADL tasks, and haircare.   Pt. continues to make progress overall, and is improving with BUE reaching in preparation for improving overhead ADLs, and haircare. Pt. continues to tolerate ROM, moist heat modality, and functional reaching without pain. Pt. was able  to reach out to the far left, and far right to move the Sa[place]o rings  through 3 horizontal rungs of progressively increasing heights with both the right, and left UEs. Pt. required less time to complete, and was able to reach further with no leaning noted today during reaching tasks. Pt. was able to tolerate the UBE for 8 min. With the UBE elevated for increased ROM. Pt. tolerated ROM well without reports of pain, or discomfort. Pt. continues to benefit from working on improving ROM in order to work towards increasing engagement of her bilateral hands during ADLs, and IADL tasks.                                 OT Education - 10/01/20 1201     Education Details Bilateral upper extremity range of motion with functional reaching to the far right and left    Person(s) Educated Patient;Caregiver(s)    Methods Explanation;Demonstration    Comprehension Verbalized understanding;Need further instruction                 OT Long Term Goals - 09/08/20 1100       OT LONG TERM GOAL #1   Title Patient and caregiver will demonstrate understanding of home exercise program for ROM.    Baseline Pt. continues to have a restorative aide rehab aide assist her wih exercises at [facility]. 8/8: Independently completes/directs HEP mulitple times/week    Time 12    Period Weeks    Status Partially Met    Target Date 10/22/20      OT LONG TERM GOAL #4   Title Patient will demonstrate improved composite finger flexion to hold deodorant to apply to underarms.    Baseline Pt. continues to present with improving finger flexion of R hand for a partial gross grasp, but unable to grasp and hold items. pt. conitnues to present with limited digit flexion of L hand. Pt. has active left thumb abduction. 8/8: holds cup with 2 hands, continues to have limited composite finger flexion L worse than R    Time 12    Period Weeks    Status On-going    Target Date 10/22/20      OT LONG TERM GOAL #6   Title Patient will increase right UE ROM to comb the back of her hair with modified independence.    Baseline 07/30/2020: Pt. is now able to comb the back of her hair with a pic, however continues to work on completing the task efficiently. 8/8: Achieves with great effort, has assistance for efficiancy    Time 12    Period Weeks    Status Partially Met      OT LONG TERM GOAL #7   Title Pt. will independently, and efficiently send text messages on her phone.    Baseline 07/30/2020: Pt. continues to present with limited BUE strength, and [facility] skills. 8/8: independtly drafts text  messages, continues to work on accuracy and efficiancy 2/2 limited [facility] [place]    Time 12    Period Weeks    Status Partially Met    Target Date 10/22/20      OT LONG TERM GOAL #8   Title Pt. will write, and sign her name with 100% legibility, and modified independence.    Baseline Pt. continues to  consistently filling out her daily menu, and writing for crossword puzzles. Pt. is able to maintain grasp on a wide  width pen. [place] to work on Media planner. 8/8: writes daily, continues to work on improving speed    Time 12    Period Weeks    Status Partially Met    Target Date 10/22/20                   Plan - 10/01/20 1202     Clinical Impression Statement Pt. continues to make progress overall, and is improving with BUE reaching in preparation for improving overhead ADLs, and haircare. Pt. continues to tolerate ROM, moist heat modality, and functional reaching without pain. Pt. was able  to reach out to the far left, and far right to move the Saebo rings  through 3 horizontal rungs of progressively increasing heights with both the right, and left UEs. Pt. required less time to complete, and was able to reach further with no leaning noted today during reaching tasks. Pt. was able to tolerate the UBE for 8 min. With the UBE elevated for increased ROM. Pt. tolerated ROM well without reports of pain, or discomfort. Pt. continues to benefit from working on improving ROM in order to work towards increasing engagement of her bilateral hands during ADLs, and IADL tasks.      OT Occupational Profile and History Detailed Assessment- Review of Records and additional review of physical, cognitive, psychosocial history related to current functional performance    Occupational performance deficits (Please refer to evaluation for details): ADL's;IADL's;Leisure;Social Participation    Body Structure / Function / Physical Skills  ADL;Continence;Dexterity;Flexibility;Strength;ROM;Balance;Coordination;FMC;IADL;Endurance;UE functional use;Decreased knowledge of use of DME;GMC    Psychosocial Skills Environmental  Adaptations;Habits;Routines and Behaviors    Rehab Potential Fair    Clinical Decision Making Several treatment options, min-mod task modification necessary    Comorbidities Affecting Occupational Performance: Presence of comorbidities impacting occupational performance    Comorbidities impacting occupational performance description: contractures of bilateral hands, dependent transfers,    Modification or Assistance to Complete Evaluation  No modification of tasks or assist necessary to complete eval    OT Frequency 2x / week    OT Duration 12 weeks    OT Treatment/Interventions Self-care/ADL training;Cryotherapy;Therapeutic exercise;DME and/or AE instruction;Balance training;Neuromuscular education;Manual Therapy;Splinting;Moist Heat;Passive range of motion;Therapeutic activities;Patient/family education    Consulted and Agree with Plan of Care Patient             Patient will benefit from skilled therapeutic intervention in order to improve the following deficits and impairments:   Body Structure / Function / Physical Skills: ADL, Continence, Dexterity, Flexibility, Strength, ROM, Balance, Coordination, FMC, IADL, Endurance, UE functional use, Decreased knowledge of use of DME, GMC   Psychosocial Skills: Environmental  Adaptations, Habits, Routines and Behaviors   Visit Diagnosis: Muscle weakness (generalized)  Other lack of coordination    Problem List Patient Active Problem List   Diagnosis Date Noted   Acute appendicitis with appendiceal abscess 05/30/2018   Hypocalcemia 02/15/2018   Dysuria 02/15/2018   Bilateral lower extremity edema 02/11/2018   Vaginal yeast infection 01/30/2018   Chest tightness 01/27/2018   Healthcare-associated pneumonia 01/25/2018   Pleural effusion, not elsewhere  classified 01/25/2018   Acute deep vein thrombosis (DVT) of left lower extremity ([place]) 01/24/2018   Acute deep vein thrombosis (DVT) of right lower extremity ([place]) 01/24/2018   Chronic allergic rhinitis 01/24/2018   Depression with anxiety 01/24/2018   UTI due to Klebsiella species 01/24/2018   Protein-calorie malnutrition, severe ([place]) 01/24/2018   S/P insertion of IVC (inferior vena caval) filter 01/24/2018  Reactive depression    Hypokalemia    Leukocytosis    Essential hypertension    Trauma    Tetraparesis ([facility])    Neuropathic pain    Neurogenic bowel    Neurogenic bladder    Benign essential HTN    Acute blood loss anemia    Central cord syndrome at C6 level of cervical spinal cord ([facility]) 11/29/2017   Central cord syndrome [facility]) 11/29/2017    Harrel Carina, MS, OTR/L 10/01/2020, 12:05 PM  [place] MA[facility] SERVICES [address] [place], [place], [address] Phone: [phone]   Fax:  [phone]  Name: SALEEMAH MOLLENHAUER MRN: [id number] Date of Birth: 1934-11-13

## 2020-10-01 NOTE — Therapy (Signed)
[place] MAIN [facility] SERVICES [address] [place], [place], [address] Phone: [phone]   Fax:  [phone]  Physical Therapy Treatment/Physical Therapy Progress Note   Dates of reporting period 08/20/2020  to  10/01/2020  Patient Details  Name: Sherry [name] MRN: [id number] Date of Birth: 1934/12/11 No data recorded  Encounter Date: 10/01/2020   PT End of Session - 10/01/20 1645     Visit Number 90    Number of Visits 104    Date for PT Re-Evaluation 12/17/20    Authorization Type [employer] medicare FOTO performed by PT on eval (7/20), score 12, Progress note on 03/10/2020; PN on 08/20/2020    Authorization Time Period Recert 02/07/6158- 7/37/1062; PN on 07/10/4852; Recert 07/28/348- 09/38/1829    PT Start Time 1147    PT Stop Time 1228    PT Time Calculation (min) 41 min    Equipment Utilized During Treatment Gait belt    Activity Tolerance Patient tolerated treatment well;Patient limited by fatigue    Behavior During Therapy WFL for tasks assessed/performed             Past Medical History:  Diagnosis Date   Acute blood loss anemia    Arthritis    Cancer ([place])    skin   Central cord syndrome at C6 level of cervical spinal cord ([place]) 11/29/2017   Hypertension    Protein-calorie malnutrition, severe ([place]) 01/24/2018   S/P insertion of IVC (inferior vena caval) filter 01/24/2018   Tetraparesis ([place])     Past Surgical History:  Procedure Laterality Date   ANTERIOR CERVICAL DECOMP/DISCECTOMY FUSION N/A 11/29/2017   Procedure: Cervical five-six, six-seven Anterior Cervical Decompression Fusion;  Surgeon: Judith Part, MD;  Location: [place];  Service: Neurosurgery;  Laterality: N/A;  Cervical five-six, six-seven Anterior Cervical Decompression Fusion   CATARACT EXTRACTION     IR IVC FILTER PLMT / S&I /IMG GUID/MOD SED  12/08/2017    There were no vitals filed for this visit.   Subjective Assessment - 10/01/20 1204      Subjective Patient reports having a good workout last session. Denies any pain.    Pertinent History Pt is an 86 yo female that fell in 2019, fractured vertebrae in her neck and in her low back per family. 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 After hospital stay, pt discharged to [facility] for ~1 month, experienced severe UTI as well as bilateral DVT (IVC filter placed). Discharged to [facility], stayed for about a year, and then moved to [facility] in April 2021. Was receiving PT, but per family facility reported that did not have adequate equipment to maximize PT for her. Pt until about 1 month ago was practicing sit to stand transfers with 1-2 people, and working on static standing. Has a brace for L foot due to PF. Pt currently needs assistance with all ADLs (able to assist with donning/doffing her shirt), bed baths, and needs a hoyer lift for transfers. Able to drive power wheelchair without assistance.    Limitations Standing;Walking;House hold activities;Lifting    How long can you sit comfortably? NA    How long can you stand comfortably? unable    How long can you walk comfortably? unable    Patient Stated Goals "to stand up and walk"    Currently in Pain? No/denies              Interventions:   Therapeutic exercises:   LAQ  BLE (AROM)  2 x 12 reps. AAROM hip march BLE (more assist with right LE)  A-P trunk (flex/ext)  x15 each direction;  Transfer training:   Patient performed 3 trials of stand pivot transfer from power chair to edge of mat with Max A from PT. Patient able to lean forward and reach for back of PT's arms. Position right LE but assist to push left LE back. She was able to stand with mod A and then able to pivot/move right LE and turn with max A to sit safely. Patient then rested and transferred back to chair with mod/max A with similar VC's. She was fatigued after performing 3 trials but required less cues on last trial- More Max A on last transfer  back to increased overall fatigue.   Clinical Impression. Patient able to transfer well with VC and physical assist and able to move her right LE upon command without left knee buckling. She remains well motivated and able to stand and pivot well today without an LOB.  Patient will benefit from continued PT services to increase strength, mobility and consistency of performance to improve patient's quality of life. Goals not reassessed secondary to just addressed and updated on 1/77/9390 for recert visit. Continuing to focus on LE strengthening and safety with transfers. Patient's condition has the potential to improve in response to therapy. Maximum improvement is yet to be obtained. The anticipated improvement is attainable and reasonable in a generally predictable time.                           PT Short Term Goals - 03/10/20 2211       PT SHORT TERM GOAL #1   Title The patient will perform initial HEP with minimum assistance in order to improve strength and function.    Baseline Patient demonstrating independence with initial HEP as of 03/10/2020 with no questions or difficulty.    Time 4    Period Weeks    Status Achieved    Target Date 02/11/20               PT Long Term Goals - 09/24/20 1427       PT LONG TERM GOAL #1   Title The patient will be compliant with finalized HEP with minimum assistance in preparation for self management and maintenance of condition.    Baseline 03/10/2020- Patient familiar with initial HEP and will keep goal active as HEP becomes more progressive including possible transfers and standing activities. 04/07/2020- Patient continues to report compliance with LE strengthening home exercises without questions or concerns. 04/21/2020- Patient able to verbalize and demonstrate good understanding of current HEP including some supine and seated LE strengthening exercises. Reviewed and patient performed 10 reps today with minimal cueing. Will keep  goal active to progress HEP as appropriate. 05/28/2020- patient reports continues to perform LE strengthening as able and some help from caregiver as able. No changes at this time to home program as patient is limited to supine/seated therex. 07/02/2020- Patient continues to report compliance with current supine and seated LE strengtheing home program and states no questions or concerns regarding her plan.    Time 12    Period Weeks    Status Achieved      PT LONG TERM GOAL #2   Title The patient will demonstrate at least 10 point improvement on FOTO score indicating an improved ability to perform functional activities.    Baseline on eval (/720) score 12;  10/22/19: 12; 12/10/19: 18, 12/13: 18: 04/07/2020- Will assess next visit. 07/02/2020- Will obtain next visit. 09/24/2020= Will obtain next visit.    Time 12    Period Weeks    Status On-going    Target Date 12/17/20      PT LONG TERM GOAL #3   Title The patient will demonstrate lateral scooting  for assistance with transfers with minA to maximize independence.    Baseline 10/22/19: Pt requires modA+1 for lateral scooting; 12/10/19: modA+1, 12/13: modA-maxA, 12/27: modA-maxA, 03/10/2020- ModA-MaxA- increased verbal cues and visual demonstration. 04/07/2020- Continued Max assist to perform forward/lateral scoot. 04/21/2020- Patient was able to demo slight lateral scoot with CGA approx 12 in then fatigued requiring max assist- she is able to scoot forward by leaning without significant issues. 05/28/2020- While sitting in chair- patient able to perform forward and lateral scoot with VC/visual demo and increased time allowed- CGA today but not consistent yet- will keep goal active at this time to continue to focus on strengthening and improving technique. 07/02/2020- Patient able to demonstrate minimal ability to laterally shift weigh on mat table requiring max assist yet is able to forward scoot out to edge of mat table with min Assist. 08/20/2020 - Patient was able to  demonstrate minimal lateral scooting at edge of mat while holding feet still so they did not slip on tile surface. She continues to be able to demonstrate scoot forward and uses tilt option to recline to position safely back into seat of power wheelchair. 09/25/2020- Patient able to sit on mat and minimally scoot laterally but requires min/mod A for efficient scooting. Will end goal at this time due to plateau in progress with scooting over extended time.    Time 12    Period Weeks    Status Not Met    Target Date 12/17/20      PT LONG TERM GOAL #4   Title The patient will demonstrate a squat pivot transfer with minimum assistance to maximize independence and mobility.    Baseline 10/22/19: unable to safely attempt at this time; 12/10/19: unable to safely attempt at this time, 01/14/20: unable to safety attempt at this time. 03/10/2020- Patient able to perform Stand pivot transfer with Maximal assist. 04/07/2020- Patient continues to require Max assist with sit to stand- will assist SPT next visit. 04/21/2020- Patient presents with Max assist to perform Sit to stand Transfer- unable to pivot or move Left LE well without difficulty. 07/02/2020= Max assist with SPT and minimal ability to pivot. 7/202/2022-Patient able to stand with minimal assist this visit (as good as CGA last visit) and able to move right foot to pivot and performing with moderate assist today. 09/24/2020- Patient able to perform sit to stand with Min assist from lift chair. Patient has demo inconsistency and will keep goal active.    Time 12    Period Weeks    Status On-going    Target Date 12/17/20      PT LONG TERM GOAL #5   Title The patient will demonstrate sitting without UE support for 2-5 minutes at EOB to improve participation and maximize independence with ADLs.    Baseline 10/22/19: Pt able to sit unsupported for at least 2 minutes at edge of bed. 04/21/2020- Patient continues to demo good sitting at edge of mat > 5 miin without  difficulty or back support.    Time 12    Period Weeks    Status Achieved      Additional Long Term  Goals   Additional Long Term Goals Yes      PT LONG TERM GOAL #6   Title Patient will demonstrate improved functional LE strength as seen by consistent ability to stand > 2 min (3 out of 4 trials) with Mod/Max A for improved LE strength with transfers.    Baseline 05/28/2020- Patient able to inconsistent stand 1-2 min right now in //bars with Mod/Max A. 07/02/2020- Patient was able to progress last 2 visit to standing using bilateral platform attachment between 1-2 min which is a progression from the parallel bars. 08/20/2020- Patient performed static standing with min- mod A using bilateral platform attachment on walker for 6 min 30 sec today with assist for trunk control and intermittent verbal cues for posture. 09/24/2020- Patient has consistently been able to stand >2 min in // bars and when using bilateral Platform walker.    Time 12    Period Weeks    Status Achieved      PT LONG TERM GOAL #7   Title Patient/caregiver will demonstrate assisted transfer using [place] consistently Independently for improved ability to transfer at home from lift chair to bed.    Baseline 09/24/2020- Patient requires hoyer lift (Dependent) for safe transfers.    Time 12    Period Weeks    Status New    Target Date 12/17/20                   Plan - 10/01/20 1643     Clinical Impression Statement Patient able to transfer well with VC and physical assist and able to move her right LE upon command without left knee buckling. She remains well motivated and able to stand and pivot well today without an LOB. ? Patient will benefit from continued PT services to increase strength, mobility and consistency of performance to improve patient's quality of life.Goals not reassessed secondary to just addressed and updated on 6/64/4034 for recert visit. Continuing to focus on LE strengthening and safety with  transfers. Patient's condition has the potential to improve in response to therapy. Maximum improvement is yet to be obtained. The anticipated improvement is attainable and reasonable in a generally predictable time.    Personal Factors and Comorbidities Age;Time since onset of injury/illness/exacerbation;Comorbidity 3+;Fitness    Comorbidities HTN, quadriparesis, history of DVT, neurogenic bladder    Examination-Activity Limitations Bathing;Hygiene/Grooming;Squat;Bed Mobility;Lift;Bend;Stand;Engineer, manufacturing;Toileting;Self Feeding;Transfers;Continence;Sit;Dressing;Sleep;Carry    Examination-Participation Restrictions Church;Laundry;Cleaning;Medication Management;Community Activity;Meal Prep;Interpersonal Relationship    Stability/Clinical Decision Making Evolving/Moderate complexity    Rehab Potential Fair    PT Frequency 2x / week    PT Duration 12 weeks    PT Treatment/Interventions ADLs/Self Care Home Management;Electrical Stimulation;Therapeutic activities;Wheelchair mobility training;Vasopneumatic Device;Joint Manipulations;Vestibular;Passive range of motion;Patient/family education;Therapeutic exercise;DME Instruction;Biofeedback;Aquatic Therapy;Moist Heat;Gait training;Balance training;Orthotic Fit/Training;Dry needling;Energy conservation;Taping;Splinting;Neuromuscular re-education;Cryotherapy;Ultrasound;Functional mobility training    PT Next Visit Plan Continue with progressive Standing/transfer training.    PT Home Exercise Plan no changes    Consulted and Agree with Plan of Care Patient             Patient will benefit from skilled therapeutic intervention in order to improve the following deficits and impairments:  Abnormal gait, Decreased balance, Decreased endurance, Decreased mobility, Difficulty walking, Hypomobility, Impaired sensation, Decreased range of motion, Improper body mechanics, Impaired perceived functional ability, Decreased activity tolerance,  Decreased knowledge of use of DME, Decreased safety awareness, Decreased strength, Impaired flexibility, Impaired UE functional use, Postural dysfunction  Visit Diagnosis: Abnormality of gait and mobility  Difficulty in walking, not  elsewhere classified  Muscle weakness (generalized)  Unsteadiness on feet     Problem List Patient Active Problem List   Diagnosis Date Noted   Acute appendicitis with appendiceal abscess 05/30/2018   Hypocalcemia 02/15/2018   Dysuria 02/15/2018   Bilateral lower extremity edema 02/11/2018   Vaginal yeast infection 01/30/2018   Chest tightness 01/27/2018   Healthcare-associated pneumonia 01/25/2018   Pleural effusion, not elsewhere classified 01/25/2018   Acute deep vein thrombosis (DVT) of left lower extremity ([place]) 01/24/2018   Acute deep vein thrombosis (DVT) of right lower extremity ([place]) 01/24/2018   Chronic allergic rhinitis 01/24/2018   Depression with anxiety 01/24/2018   UTI due to Klebsiella species 01/24/2018   Protein-calorie malnutrition, severe ([place]) 01/24/2018   S/P insertion of IVC (inferior vena caval) filter 01/24/2018   Reactive depression    Hypokalemia    Leukocytosis    Essential hypertension    Trauma    Tetraparesis ([facility])    Neuropathic pain    Neurogenic bowel    Neurogenic bladder    Benign essential HTN    Acute blood loss anemia    Central cord syndrome at C6 level of cervical spinal cord ([place]) 11/29/2017   Central cord syndrome ([place]) 11/29/2017    Lewis Moccasin, PT 10/01/2020, 4:49 PM  [place] MAIN [facility] SERVICES [place], [place], [address] Phone: [phone]   Fax:  [phone]  Name: Sherry [name] MRN: [id number] Date of Birth: 1934/11/29

## 2020-10-08 ENCOUNTER — Ambulatory Visit: Payer: Medicare HMO

## 2020-10-08 ENCOUNTER — Ambulatory Visit: Payer: Medicare HMO | Attending: Internal Medicine | Admitting: Occupational Therapy

## 2020-10-08 ENCOUNTER — Other Ambulatory Visit: Payer: Self-pay

## 2020-10-08 DIAGNOSIS — R269 Unspecified abnormalities of gait and mobility: Secondary | ICD-10-CM

## 2020-10-08 DIAGNOSIS — S14129S Central cord syndrome at unspecified level of cervical spinal cord, sequela: Secondary | ICD-10-CM | POA: Diagnosis present

## 2020-10-08 DIAGNOSIS — R2681 Unsteadiness on feet: Secondary | ICD-10-CM | POA: Diagnosis present

## 2020-10-08 DIAGNOSIS — R262 Difficulty in walking, not elsewhere classified: Secondary | ICD-10-CM

## 2020-10-08 DIAGNOSIS — M6281 Muscle weakness (generalized): Secondary | ICD-10-CM | POA: Insufficient documentation

## 2020-10-08 DIAGNOSIS — R2689 Other abnormalities of gait and mobility: Secondary | ICD-10-CM | POA: Insufficient documentation

## 2020-10-08 DIAGNOSIS — R140 Abdominal distension (gaseous): Secondary | ICD-10-CM | POA: Diagnosis present

## 2020-10-08 DIAGNOSIS — R278 Other lack of coordination: Secondary | ICD-10-CM | POA: Insufficient documentation

## 2020-10-08 NOTE — Therapy (Signed)
[employer] MAIN [facility] SERVICES [address] [place], [place], [address] Phone: [phone]   Fax:  [phone]  Physical Therapy Treatment  Patient Details  Name: Sherry [name] MRN: [id number] Date of Birth: [birth year] No data recorded  Encounter Date: 10/08/2020   PT End of Session - 10/08/20 1150     Visit Number 91    Number of Visits 104    Date for PT Re-Evaluation 12/17/20    Authorization Type [employer] medicare FOTO performed by PT on eval (7/20), score 12, Progress note on 03/10/2020; PN on 08/20/2020    Authorization Time Period Recert 08/09/5883- 0/27/7412; PN on 09/08/8674; Recert 08/20/9468- 96/28/3662; PN on 10/01/2020    PT Start Time 1146    PT Stop Time 1226    PT Time Calculation (min) 40 min    Equipment Utilized During Treatment Gait belt    Activity Tolerance Patient tolerated treatment well;Patient limited by fatigue    Behavior During Therapy WFL for tasks assessed/performed             Past Medical History:  Diagnosis Date   Acute blood loss anemia    Arthritis    Cancer ([place])    skin   Central cord syndrome at C6 level of cervical spinal cord ([place]) 11/29/2017   Hypertension    Protein-calorie malnutrition, severe ([place]) 01/24/2018   S/P insertion of IVC (inferior vena caval) filter 01/24/2018   Tetraparesis ([place])     Past Surgical History:  Procedure Laterality Date   ANTERIOR CERVICAL DECOMP/DISCECTOMY FUSION N/A 11/29/2017   Procedure: Cervical five-six, six-seven Anterior Cervical Decompression Fusion;  Surgeon: Judith Part, MD;  Location: [facility];  Service: Neurosurgery;  Laterality: N/A;  Cervical five-six, six-seven Anterior Cervical Decompression Fusion   CATARACT EXTRACTION     IR IVC FILTER PLMT / S&I /IMG GUID/MOD SED  12/08/2017    There were no vitals filed for this visit.   Subjective Assessment - 10/08/20 1149     Subjective Patient reports having a good day with no issues.    Pertinent  History Pt is an 86 yo female that fell in 2019, fractured vertebrae in her neck and in her low back per family. 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 After hospital stay, pt discharged to [facility] for ~1 month, experienced severe UTI as well as bilateral DVT (IVC filter placed). Discharged to [facility], stayed for about a year, and then moved to [facility] in April 2021. Was receiving PT, but per family facility reported that did not have adequate equipment to maximize PT for her. Pt until about 1 month ago was practicing sit to stand transfers with 1-2 people, and working on static standing. Has a brace for L foot due to PF. Pt currently needs assistance with all ADLs (able to assist with donning/doffing her shirt), bed baths, and needs a hoyer lift for transfers. Able to drive power wheelchair without assistance.    Limitations Standing;Walking;House hold activities;Lifting    How long can you sit comfortably? NA    How long can you stand comfortably? unable    How long can you walk comfortably? unable    Patient Stated Goals "to stand up and walk"    Currently in Pain? No/denies              INTERVENTIONS:   Therapeutic exercises:   Seated LE - Hip march- AAROM x 15 reps each Seated LE - Knee ext- AROM on  left and AAROM on right  2 sets of 12 reps. Seated hip add squeeze on soft red ball with 5 sec hold x 15 reps.  Seated Gluteal squeeze with 5 sec hold x 15 reps  Reclined chair back and then performed:   Heel slides with AAROM BLE  2 sets of 12 reps Hip ABD with minimal assist each LE - 2 sets of 12 reps. (More assist on right LE to hold up)  Straight leg raises- x 12 reps for 1 set each LE with min assist with emphasis on slow eccentric control.   PROM to B knee (to improve knee flex) and then hamstring B stretching x 20 sec x 3 sets each Leg.  Education provided throughout session via VC/TC and demonstration to facilitate movement at target joints and correct  muscle activation for all testing and exercises performed.   Clinical Impression: Patient able to complete most exercises with min assist with right LE and CGA to min A with left LE. She presents with good motivation and receptive to all cues for correct muscle activation. She was fatigued requiring brief rest between each exercise. Patient will benefit from continued PT services to increase strength, mobility and consistency of performance to improve patient's quality of life.                          Upper Extremity Functional Index Score :   /80   PT Education - 10/08/20 1150     Education Details Exercise technique    Person(s) Educated Patient    Methods Explanation;Demonstration;Tactile cues;Verbal cues    Comprehension Verbalized understanding;Returned demonstration;Tactile cues required;Verbal cues required;Need further instruction              PT Short Term Goals - 03/10/20 2211       PT SHORT TERM GOAL #1   Title The patient will perform initial HEP with minimum assistance in order to improve strength and function.    Baseline Patient demonstrating independence with initial HEP as of 03/10/2020 with no questions or difficulty.    Time 4    Period Weeks    Status Achieved    Target Date 02/11/20               PT Long Term Goals - 09/24/20 1427       PT LONG TERM GOAL #1   Title The patient will be compliant with finalized HEP with minimum assistance in preparation for self management and maintenance of condition.    Baseline 03/10/2020- Patient familiar with initial HEP and will keep goal active as HEP becomes more progressive including possible transfers and standing activities. 04/07/2020- Patient continues to report compliance with LE strengthening home exercises without questions or concerns. 04/21/2020- Patient able to verbalize and demonstrate good understanding of current HEP including some supine and seated LE strengthening exercises.  Reviewed and patient performed 10 reps today with minimal cueing. Will keep goal active to progress HEP as appropriate. 05/28/2020- patient reports continues to perform LE strengthening as able and some help from caregiver as able. No changes at this time to home program as patient is limited to supine/seated therex. 07/02/2020- Patient continues to report compliance with current supine and seated LE strengtheing home program and states no questions or concerns regarding her plan.    Time 12    Period Weeks    Status Achieved      PT LONG TERM GOAL #2   Title  The patient will demonstrate at least 10 point improvement on FOTO score indicating an improved ability to perform functional activities.    Baseline on eval (/720) score 12; 10/22/19: 12; 12/10/19: 18, 12/13: 18: 04/07/2020- Will assess next visit. 07/02/2020- Will obtain next visit. 09/24/2020= Will obtain next visit.    Time 12    Period Weeks    Status On-going    Target Date 12/17/20      PT LONG TERM GOAL #3   Title The patient will demonstrate lateral scooting  for assistance with transfers with minA to maximize independence.    Baseline 10/22/19: Pt requires modA+1 for lateral scooting; 12/10/19: modA+1, 12/13: modA-maxA, 12/27: modA-maxA, 03/10/2020- ModA-MaxA- increased verbal cues and visual demonstration. 04/07/2020- Continued Max assist to perform forward/lateral scoot. 04/21/2020- Patient was able to demo slight lateral scoot with CGA approx 12 in then fatigued requiring max assist- she is able to scoot forward by leaning without significant issues. 05/28/2020- While sitting in chair- patient able to perform forward and lateral scoot with VC/visual demo and increased time allowed- CGA today but not consistent yet- will keep goal active at this time to continue to focus on strengthening and improving technique. 07/02/2020- Patient able to demonstrate minimal ability to laterally shift weigh on mat table requiring max assist yet is able to forward  scoot out to edge of mat table with min Assist. 08/20/2020 - Patient was able to demonstrate minimal lateral scooting at edge of mat while holding feet still so they did not slip on tile surface. She continues to be able to demonstrate scoot forward and uses tilt option to recline to position safely back into seat of power wheelchair. 09/25/2020- Patient able to sit on mat and minimally scoot laterally but requires min/mod A for efficient scooting. Will end goal at this time due to plateau in progress with scooting over extended time.    Time 12    Period Weeks    Status Not Met    Target Date 12/17/20      PT LONG TERM GOAL #4   Title The patient will demonstrate a squat pivot transfer with minimum assistance to maximize independence and mobility.    Baseline 10/22/19: unable to safely attempt at this time; 12/10/19: unable to safely attempt at this time, 01/14/20: unable to safety attempt at this time. 03/10/2020- Patient able to perform Stand pivot transfer with Maximal assist. 04/07/2020- Patient continues to require Max assist with sit to stand- will assist SPT next visit. 04/21/2020- Patient presents with Max assist to perform Sit to stand Transfer- unable to pivot or move Left LE well without difficulty. 07/02/2020= Max assist with SPT and minimal ability to pivot. 7/202/2022-Patient able to stand with minimal assist this visit (as good as CGA last visit) and able to move right foot to pivot and performing with moderate assist today. 09/24/2020- Patient able to perform sit to stand with Min assist from lift chair. Patient has demo inconsistency and will keep goal active.    Time 12    Period Weeks    Status On-going    Target Date 12/17/20      PT LONG TERM GOAL #5   Title The patient will demonstrate sitting without UE support for 2-5 minutes at EOB to improve participation and maximize independence with ADLs.    Baseline 10/22/19: Pt able to sit unsupported for at least 2 minutes at edge of bed.  04/21/2020- Patient continues to demo good sitting at edge of mat > 5  miin without difficulty or back support.    Time 12    Period Weeks    Status Achieved      Additional Long Term Goals   Additional Long Term Goals Yes      PT LONG TERM GOAL #6   Title Patient will demonstrate improved functional LE strength as seen by consistent ability to stand > 2 min (3 out of 4 trials) with Mod/Max A for improved LE strength with transfers.    Baseline 05/28/2020- Patient able to inconsistent stand 1-2 min right now in //bars with Mod/Max A. 07/02/2020- Patient was able to progress last 2 visit to standing using bilateral platform attachment between 1-2 min which is a progression from the parallel bars. 08/20/2020- Patient performed static standing with min- mod A using bilateral platform attachment on walker for 6 min 30 sec today with assist for trunk control and intermittent verbal cues for posture. 09/24/2020- Patient has consistently been able to stand >2 min in // bars and when using bilateral Platform walker.    Time 12    Period Weeks    Status Achieved      PT LONG TERM GOAL #7   Title Patient/caregiver will demonstrate assisted transfer using Electra consistently Independently for improved ability to transfer at home from lift chair to bed.    Baseline 09/24/2020- Patient requires hoyer lift (Dependent) for safe transfers.    Time 12    Period Weeks    Status New    Target Date 12/17/20                   Plan - 10/08/20 1151     Clinical Impression Statement Patient able to complete most exercises with min assist with right LE and CGA to min A with left LE. She presents with good motivation and receptive to all cues for correct muscle activation. She was fatigued requiring brief rest between each exercise. Patient will benefit from continued PT services to increase strength, mobility and consistency of performance to improve patient's quality of life.    Personal Factors and  Comorbidities Age;Time since onset of injury/illness/exacerbation;Comorbidity 3+;Fitness    Comorbidities HTN, quadriparesis, history of DVT, neurogenic bladder    Examination-Activity Limitations Bathing;Hygiene/Grooming;Squat;Bed Mobility;Lift;Bend;Stand;Engineer, manufacturing;Toileting;Self Feeding;Transfers;Continence;Sit;Dressing;Sleep;Carry    Examination-Participation Restrictions Church;Laundry;Cleaning;Medication Management;Community Activity;Meal Prep;Interpersonal Relationship    Stability/Clinical Decision Making Evolving/Moderate complexity    Rehab Potential Fair    PT Frequency 2x / week    PT Duration 12 weeks    PT Treatment/Interventions ADLs/Self Care Home Management;Electrical Stimulation;Therapeutic activities;Wheelchair mobility training;Vasopneumatic Device;Joint Manipulations;Vestibular;Passive range of motion;Patient/family education;Therapeutic exercise;DME Instruction;Biofeedback;Aquatic Therapy;Moist Heat;Gait training;Balance training;Orthotic Fit/Training;Dry needling;Energy conservation;Taping;Splinting;Neuromuscular re-education;Cryotherapy;Ultrasound;Functional mobility training    PT Next Visit Plan Continue with progressive Standing/transfer training.    PT Home Exercise Plan no changes    Consulted and Agree with Plan of Care Patient             Patient will benefit from skilled therapeutic intervention in order to improve the following deficits and impairments:  Abnormal gait, Decreased balance, Decreased endurance, Decreased mobility, Difficulty walking, Hypomobility, Impaired sensation, Decreased range of motion, Improper body mechanics, Impaired perceived functional ability, Decreased activity tolerance, Decreased knowledge of use of DME, Decreased safety awareness, Decreased strength, Impaired flexibility, Impaired UE functional use, Postural dysfunction  Visit Diagnosis: Abnormality of gait and mobility  Difficulty in walking, not elsewhere  classified  Muscle weakness (generalized)  Other lack of coordination     Problem List Patient Active Problem  List   Diagnosis Date Noted   Acute appendicitis with appendiceal abscess 05/30/2018   Hypocalcemia 02/15/2018   Dysuria 02/15/2018   Bilateral lower extremity edema 02/11/2018   Vaginal yeast infection 01/30/2018   Chest tightness 01/27/2018   Healthcare-associated pneumonia 01/25/2018   Pleural effusion, not elsewhere classified 01/25/2018   Acute deep vein thrombosis (DVT) of left lower extremity ([place]) 01/24/2018   Acute deep vein thrombosis (DVT) of right lower extremity ([place]) 01/24/2018   Chronic allergic rhinitis 01/24/2018   Depression with anxiety 01/24/2018   UTI due to Klebsiella species 01/24/2018   Protein-calorie malnutrition, severe ([place]) 01/24/2018   S/P insertion of IVC (inferior vena caval) filter 01/24/2018   Reactive depression    Hypokalemia    Leukocytosis    Essential hypertension    Trauma    Tetraparesis ([facility])    Neuropathic pain    Neurogenic bowel    Neurogenic bladder    Benign essential HTN    Acute blood loss anemia    Central cord syndrome at C6 level of cervical spinal cord ([place]) 11/29/2017   Central cord syndrome ([place]) 11/29/2017    Lewis Moccasin, PT 10/09/2020, 9:12 AM  [place], [place], [address] Phone: [phone]   Fax:  [phone]  Name: Sherry [name] MRN: [id number] Date of Birth: 23-Mar-1934

## 2020-10-08 NOTE — Therapy (Signed)
[place] MAIN [facility] SERVICES [address] [place], [place], [address] Phone: [phone]   Fax:  [phone]  Occupational Therapy Treatment  Patient Details  Name: Sherry [name] MRN: [id number] Date of Birth: 05/09/1934 No data recorded  Encounter Date: 10/08/2020   OT End of Session - 10/08/20 1108     Visit Number [id number]    Number of Visits 42    Date for OT Re-Evaluation 10/22/20    Authorization Time Period Progress report period starting 09/08/2020    OT Start Time 1100    OT Stop Time 1145    OT Time Calculation (min) 45 min    Activity Tolerance Patient tolerated treatment well    Behavior During Therapy [facility] for tasks assessed/performed             Past Medical History:  Diagnosis Date   Acute blood loss anemia    Arthritis    Cancer ([place])    skin   Central cord syndrome at C6 level of cervical spinal cord ([place]) 11/29/2017   Hypertension    Protein-calorie malnutrition, severe ([place]) 01/24/2018   S/P insertion of IVC (inferior vena caval) filter 01/24/2018   Tetraparesis ([place])     Past Surgical History:  Procedure Laterality Date   ANTERIOR CERVICAL DECOMP/DISCECTOMY FUSION N/A 11/29/2017   Procedure: Cervical five-six, six-seven Anterior Cervical Decompression Fusion;  Surgeon: Judith Part, MD;  Location: [place];  Service: Neurosurgery;  Laterality: N/A;  Cervical five-six, six-seven Anterior Cervical Decompression Fusion   CATARACT EXTRACTION     IR IVC FILTER PLMT / S&I /IMG GUID/MOD SED  12/08/2017    There were no vitals filed for this visit.  OT TREATMENT     Pt. tolerated bilateral shoulder flexion, extension, abduction, elbow flexion, extension forearm supination AAROM/AROM. PROM bilateral wrist extension, digit MP, PIP, and DIP flexion, and extension, thumb radial, and palmar abduction in conjunction with moist heat. Pt. worked on [employer], and reciprocal motion using the UBE while sitting for  62mn. with no resistance. Constant monitoring was provided. Pt. worked on reaching with abduction reaching out to the far left, and the far right using the Saebo horizontal ring tower.  Patient worked reaching out for the [employer]with the left UE, and passing it to the right then reaching out with the right to place them on the 1st and 2nd horizontal rungs with 1# wrist cuff weights in place. Pt. was able to perform the task more freely today. Pt. worked on Bilateral UE reaching, and ROM in order to improve functional reaching for ADL, IADL tasks, and haircare.   Pt. continues to make progress overall, and is improving with BUE reaching in preparation for improving overhead ADLs, and haircare. Pt. continues to tolerate ROM, moist heat modality, and functional reaching without pain. Pt. was able  to reach out to the far left, and far right to move the Saebo rings  through 3 horizontal rungs of progressively increasing heights with both the right, and left UEs. Pt. Tolerated 1# wrist weights. Pt. required less time to complete, and was able to reach further with no leaning noted today during reaching tasks. Pt. was able to tolerate the UBE for 10 min. With the UBE elevated for increased ROM. Pt. tolerated ROM well without reports of pain, or discomfort. Pt. continues to benefit from working on improving ROM in order to work towards increasing engagement of her bilateral hands during ADLs, and IADL tasks.  OT Education - 10/08/20 1108     Education Details Bilateral upper extremity range of motion with functional reaching to the far right and left    Person(s) Educated Patient;Caregiver(s)    Methods Explanation;Demonstration    Comprehension Verbalized understanding;Need further instruction                 OT Long Term Goals - 09/08/20 1100       OT LONG TERM GOAL #1   Title Patient and caregiver will demonstrate understanding of home  exercise program for ROM.    Baseline Pt. continues to have a restorative aide rehab aide assist her wih exercises at [facility]. 8/8: Independently completes/directs HEP mulitple times/week    Time 12    Period Weeks    Status Partially Met    Target Date 10/22/20      OT LONG TERM GOAL #4   Title Patient will demonstrate improved composite finger flexion to hold deodorant to apply to underarms.    Baseline Pt. continues to present with improving finger flexion of R hand for a partial gross grasp, but unable to grasp and hold items. pt. conitnues to present with limited digit flexion of L hand. Pt. has active left thumb abduction. 8/8: holds cup with 2 hands, continues to have limited composite finger flexion L worse than R    Time 12    Period Weeks    Status On-going    Target Date 10/22/20      OT LONG TERM GOAL #6   Title Patient will increase right UE ROM to comb the back of her hair with modified independence.    Baseline 07/30/2020: Pt. is now able to comb the back of her hair with a pic, however continues to work on completing the task efficiently. 8/8: Achieves with great effort, has assistance for efficiancy    Time 12    Period Weeks    Status Partially Met      OT LONG TERM GOAL #7   Title Pt. will independently, and efficiently send text messages on her phone.    Baseline 07/30/2020: Pt. continues to present with limited BUE strength, and [facility] skills. 8/8: independtly drafts text messages, continues to work on accuracy and efficiancy 2/2 limited [facility]    Time 12    Period Weeks    Status Partially Met    Target Date 10/22/20      OT LONG TERM GOAL #8   Title Pt. will write, and sign her name with 100% legibility, and modified independence.    Baseline Pt. continues to  consistently filling out her daily menu, and writing for crossword puzzles. Pt. is able to maintain grasp on a wide width pen. [place] to work on [employer]. 8/8: writes daily, continues to work on  improving speed    Time 12    Period Weeks    Status Partially Met    Target Date 10/22/20                   Plan - 10/08/20 1109     Clinical Impression Statement Pt. continues to make progress overall, and is improving with BUE reaching in preparation for improving overhead ADLs, and haircare. Pt. continues to tolerate ROM, moist heat modality, and functional reaching without pain. Pt. was able  to reach out to the far left, and far right to move the Saebo rings  through 3 horizontal rungs of progressively increasing heights with both the right, and  left UEs. Pt. Tolerated 1# wrist weights. Pt. required less time to complete, and was able to reach further with no leaning noted today during reaching tasks. Pt. was able to tolerate the UBE for 10 min. With the UBE elevated for increased ROM. Pt. tolerated ROM well without reports of pain, or discomfort. Pt. continues to benefit from working on improving ROM in order to work towards increasing engagement of her bilateral hands during ADLs, and IADL tasks.          OT Occupational Profile and History Detailed Assessment- Review of Records and additional review of physical, cognitive, psychosocial history related to current functional performance    Occupational performance deficits (Please refer to evaluation for details): ADL's;IADL's;Leisure;Social Participation    Body Structure / Function / Physical Skills ADL;Continence;Dexterity;Flexibility;Strength;ROM;Balance;Coordination;FMC;IADL;Endurance;UE functional use;Decreased knowledge of use of DME;GMC    Psychosocial Skills Environmental  Adaptations;Habits;Routines and Behaviors    Rehab Potential Fair    Clinical Decision Making Several treatment options, min-mod task modification necessary    Comorbidities Affecting Occupational Performance: Presence of comorbidities impacting occupational performance    Comorbidities impacting occupational performance description: contractures of  bilateral hands, dependent transfers,    Modification or Assistance to Complete Evaluation  No modification of tasks or assist necessary to complete eval    OT Frequency 2x / week    OT Duration 12 weeks    OT Treatment/Interventions Self-care/ADL training;Cryotherapy;Therapeutic exercise;DME and/or AE instruction;Balance training;Neuromuscular education;Manual Therapy;Splinting;Moist Heat;Passive range of motion;Therapeutic activities;Patient/family education    Consulted and Agree with Plan of Care Patient             Patient will benefit from skilled therapeutic intervention in order to improve the following deficits and impairments:   Body Structure / Function / Physical Skills: ADL, Continence, Dexterity, Flexibility, Strength, ROM, Balance, Coordination, FMC, IADL, Endurance, UE functional use, Decreased knowledge of use of DME, GMC   Psychosocial Skills: Environmental  Adaptations, Habits, Routines and Behaviors   Visit Diagnosis: Muscle weakness (generalized)  Other lack of coordination    Problem List Patient Active Problem List   Diagnosis Date Noted   Acute appendicitis with appendiceal abscess 05/30/2018   Hypocalcemia 02/15/2018   Dysuria 02/15/2018   Bilateral lower extremity edema 02/11/2018   Vaginal yeast infection 01/30/2018   Chest tightness 01/27/2018   Healthcare-associated pneumonia 01/25/2018   Pleural effusion, not elsewhere classified 01/25/2018   Acute deep vein thrombosis (DVT) of left lower extremity ([place]) 01/24/2018   Acute deep vein thrombosis (DVT) of right lower extremity ([place]) 01/24/2018   Chronic allergic rhinitis 01/24/2018   Depression with anxiety 01/24/2018   UTI due to Klebsiella species 01/24/2018   Protein-calorie malnutrition, severe ([place]) 01/24/2018   S/P insertion of IVC (inferior vena caval) filter 01/24/2018   Reactive depression    Hypokalemia    Leukocytosis    Essential hypertension    Trauma    Tetraparesis ([facility])     Neuropathic pain    Neurogenic bowel    Neurogenic bladder    Benign essential HTN    Acute blood loss anemia    Central cord syndrome at C6 level of cervical spinal cord ([place]) 11/29/2017   Central cord syndrome ([place]) 11/29/2017    Harrel Carina, MS, OTR/L 10/08/2020, 11:30 AM  [place] MAIN [facility] [place] SERVICES [address] [place], [place], [address] Phone: [phone]   Fax:  [phone]  Name: Sherry [name] MRN: [id number] Date of Birth: May 26, 1934

## 2020-10-13 ENCOUNTER — Ambulatory Visit: Payer: Medicare HMO

## 2020-10-13 ENCOUNTER — Ambulatory Visit: Payer: Medicare HMO | Admitting: Occupational Therapy

## 2020-10-13 ENCOUNTER — Encounter: Payer: Self-pay | Admitting: Occupational Therapy

## 2020-10-13 ENCOUNTER — Other Ambulatory Visit: Payer: Self-pay

## 2020-10-13 DIAGNOSIS — M6281 Muscle weakness (generalized): Secondary | ICD-10-CM | POA: Diagnosis not present

## 2020-10-13 DIAGNOSIS — R2681 Unsteadiness on feet: Secondary | ICD-10-CM

## 2020-10-13 DIAGNOSIS — R262 Difficulty in walking, not elsewhere classified: Secondary | ICD-10-CM

## 2020-10-13 DIAGNOSIS — R278 Other lack of coordination: Secondary | ICD-10-CM

## 2020-10-13 DIAGNOSIS — R269 Unspecified abnormalities of gait and mobility: Secondary | ICD-10-CM

## 2020-10-13 NOTE — Therapy (Signed)
[place] MAIN [facility] SERVICES [address] [place], [place], [address] Phone: [phone]   Fax:  [phone]  Occupational Therapy Treatment  Patient Details  Name: Sherry [name] MRN: [id number] Date of Birth: 1934-03-24 No data recorded  Encounter Date: 10/13/2020   OT End of Session - 10/13/20 1156     Visit Number 66    Number of Visits 63    Date for OT Re-Evaluation 10/22/20    Authorization Time Period Progress report period starting 09/08/2020    OT Start Time 1145    OT Stop Time 1230    OT Time Calculation (min) 45 min    Activity Tolerance Patient tolerated treatment well    Behavior During Therapy [facility] for tasks assessed/performed             Past Medical History:  Diagnosis Date   Acute blood loss anemia    Arthritis    Cancer ([place])    skin   Central cord syndrome at C6 level of cervical spinal cord ([place]) 11/29/2017   Hypertension    Protein-calorie malnutrition, severe ([place]) 01/24/2018   S/P insertion of IVC (inferior vena caval) filter 01/24/2018   Tetraparesis ([place])     Past Surgical History:  Procedure Laterality Date   ANTERIOR CERVICAL DECOMP/DISCECTOMY FUSION N/A 11/29/2017   Procedure: Cervical five-six, six-seven Anterior Cervical Decompression Fusion;  Surgeon: Judith Part, MD;  Location: [place];  Service: Neurosurgery;  Laterality: N/A;  Cervical five-six, six-seven Anterior Cervical Decompression Fusion   CATARACT EXTRACTION     IR IVC FILTER PLMT / S&I /IMG GUID/MOD SED  12/08/2017    There were no vitals filed for this visit.   Subjective Assessment - 10/13/20 1155     Subjective  Patient reports that he is doing well today    Patient is accompanied by: Family member    Pertinent History Patient s/p fall November 29, 2017 resulting in diagnosis of central cord syndrome at C 6 level.  She has had therapy in multiple venues but with recent move to [facility], therapy staff recommended she  seek outpatient therapy for her needs.    Currently in Pain? No/denies            OT TREATMENT     Pt. tolerated bilateral shoulder flexion, extension, abduction, elbow flexion, extension forearm supination AAROM/AROM. PROM bilateral wrist extension, digit MP, PIP, and DIP flexion, and extension, thumb radial, and palmar abduction in conjunction with moist heat. Pt. Worked on BUE strengthening, and reciprocal motion using the UBE while sitting for 59mn. with no resistance. Constant monitoring was provided. Pt. worked on reaching with abduction reaching out to the far left, and the far right using the Saebo horizontal ring tower.  Patient worked reaching out for the [employer]with the left UE, and passing it to the right then reaching out with the right to place them on the 1st and 2nd horizontal rungs. Pt. was able to perform the task more freely today. Pt. worked on Bilateral UE reaching, and ROM in order to improve functional reaching for ADL, IADL tasks, and haircare.   Pt. continues to make progress overall, and is improving with BUE reaching in preparation for improving overhead ADLs, and haircare. Pt. continues to tolerate ROM, moist heat modality, and functional reaching without pain. Pt. was able  to reach out to the far left, and far right to move the Saebo rings  through 2 horizontal rungs of progressively  increasing heights with both the right, and left UEs. Pt. required less time to complete, and was able to reach further with no leaning noted today during reaching tasks. Pt. was able to tolerate the UBE for 8 min. With the UBE elevated for increased ROM. Pt. tolerated ROM well without reports of pain, or discomfort. Pt. continues to benefit from working on improving ROM in order to work towards increasing engagement of her bilateral hands during ADLs, and IADL tasks.                         OT Education - 10/13/20 1156     Education Details Bilateral upper extremity  range of motion with functional reaching to the far right and left    Person(s) Educated Patient;Caregiver(s)    Methods Explanation;Demonstration    Comprehension Verbalized understanding;Need further instruction                 OT Long Term Goals - 09/08/20 1100       OT LONG TERM GOAL #1   Title Patient and caregiver will demonstrate understanding of home exercise program for ROM.    Baseline Pt. continues to have a restorative aide rehab aide assist her wih exercises at [facility]. 8/8: Independently completes/directs HEP mulitple times/week    Time 12    Period Weeks    Status Partially Met    Target Date 10/22/20      OT LONG TERM GOAL #4   Title Patient will demonstrate improved composite finger flexion to hold deodorant to apply to underarms.    Baseline Pt. continues to present with improving finger flexion of R hand for a partial gross grasp, but unable to grasp and hold items. pt. conitnues to present with limited digit flexion of L hand. Pt. has active left thumb abduction. 8/8: holds cup with 2 hands, continues to have limited composite finger flexion L worse than R    Time 12    Period Weeks    Status On-going    Target Date 10/22/20      OT LONG TERM GOAL #6   Title Patient will increase right UE ROM to comb the back of her hair with modified independence.    Baseline 07/30/2020: Pt. is now able to comb the back of her hair with a pic, however continues to work on completing the task efficiently. 8/8: Achieves with great effort, has assistance for efficiancy    Time 12    Period Weeks    Status Partially Met      OT LONG TERM GOAL #7   Title Pt. will independently, and efficiently send text messages on her phone.    Baseline 07/30/2020: Pt. continues to present with limited BUE strength, and [facility] skills. 8/8: independtly drafts text messages, continues to work on accuracy and efficiancy 2/2 limited [facility]    Time 12    Period Weeks    Status Partially Met     Target Date 10/22/20      OT LONG TERM GOAL #8   Title Pt. will write, and sign her name with 100% legibility, and modified independence.    Baseline Pt. continues to  consistently filling out her daily menu, and writing for crossword puzzles. Pt. is able to maintain grasp on a wide width pen. [place] to work on [employer]. 8/8: writes daily, continues to work on improving speed    Time 12    Period Weeks  Status Partially Met    Target Date 10/22/20                   Plan - 10/13/20 1159     Clinical Impression Statement Pt. continues to make progress overall, and is improving with BUE reaching in preparation for improving overhead ADLs, and haircare. Pt. continues to tolerate ROM, moist heat modality, and functional reaching without pain. Pt. was able  to reach out to the far left, and far right to move the Saebo rings  through 2 horizontal rungs of progressively increasing heights with both the right, and left UEs. Pt. required less time to complete, and was able to reach further with no leaning noted today during reaching tasks. Pt. was able to tolerate the UBE for 8 min. With the UBE elevated for increased ROM. Pt. tolerated ROM well without reports of pain, or discomfort. Pt. continues to benefit from working on improving ROM in order to work towards increasing engagement of her bilateral hands during ADLs, and IADL tasks.      OT Occupational Profile and History Detailed Assessment- Review of Records and additional review of physical, cognitive, psychosocial history related to current functional performance    Occupational performance deficits (Please refer to evaluation for details): ADL's;IADL's;Leisure;Social Participation    Body Structure / Function / Physical Skills ADL;Continence;Dexterity;Flexibility;Strength;ROM;Balance;Coordination;FMC;IADL;Endurance;UE functional use;Decreased knowledge of use of DME;GMC    Psychosocial Skills Environmental   Adaptations;Habits;Routines and Behaviors    Rehab Potential Fair    Clinical Decision Making Several treatment options, min-mod task modification necessary    Comorbidities Affecting Occupational Performance: Presence of comorbidities impacting occupational performance    Modification or Assistance to Complete Evaluation  No modification of tasks or assist necessary to complete eval    OT Frequency 2x / week    OT Duration 12 weeks    OT Treatment/Interventions Self-care/ADL training;Cryotherapy;Therapeutic exercise;DME and/or AE instruction;Balance training;Neuromuscular education;Manual Therapy;Splinting;Moist Heat;Passive range of motion;Therapeutic activities;Patient/family education    Consulted and Agree with Plan of Care Patient             Patient will benefit from skilled therapeutic intervention in order to improve the following deficits and impairments:   Body Structure / Function / Physical Skills: ADL, Continence, Dexterity, Flexibility, Strength, ROM, Balance, Coordination, FMC, IADL, Endurance, UE functional use, Decreased knowledge of use of DME, GMC   Psychosocial Skills: Environmental  Adaptations, Habits, Routines and Behaviors   Visit Diagnosis: Muscle weakness (generalized)  Other lack of coordination    Problem List Patient Active Problem List   Diagnosis Date Noted   Acute appendicitis with appendiceal abscess 05/30/2018   Hypocalcemia 02/15/2018   Dysuria 02/15/2018   Bilateral lower extremity edema 02/11/2018   Vaginal yeast infection 01/30/2018   Chest tightness 01/27/2018   Healthcare-associated pneumonia 01/25/2018   Pleural effusion, not elsewhere classified 01/25/2018   Acute deep vein thrombosis (DVT) of left lower extremity ([place]) 01/24/2018   Acute deep vein thrombosis (DVT) of right lower extremity ([place]) 01/24/2018   Chronic allergic rhinitis 01/24/2018   Depression with anxiety 01/24/2018   UTI due to Klebsiella species 01/24/2018    Protein-calorie malnutrition, severe ([place]) 01/24/2018   S/P insertion of IVC (inferior vena caval) filter 01/24/2018   Reactive depression    Hypokalemia    Leukocytosis    Essential hypertension    Trauma    Tetraparesis ([facility])    Neuropathic pain    Neurogenic bowel    Neurogenic bladder    Benign essential  HTN    Acute blood loss anemia    Central cord syndrome at C6 level of cervical spinal cord ([facility]) 11/29/2017   Central cord syndrome [facility]) 11/29/2017    Harrel Carina, MS, OTR/L 10/13/2020, 12:02 PM  [place] MAIN [facility] SERVICES [address] [place], [place], [address] Phone: [phone]   Fax:  [phone]  Name: Sherry [name] MRN: [id number] Date of Birth: [birth year]

## 2020-10-13 NOTE — Therapy (Signed)
[place] MAIN [facility] SERVICES [address] [place], [place], [address] Phone: [phone]   Fax:  [phone]  Physical Therapy Treatment  Patient Details  Name: Sherry [name] MRN: [id number] Date of Birth: 22-Feb-1934 No data recorded  Encounter Date: 10/13/2020   PT End of Session - 10/13/20 1215     Visit Number 92    Number of Visits 104    Date for PT Re-Evaluation 12/17/20    Authorization Type [employer] FOTO performed by PT on eval (7/20), score 12, Progress note on 03/10/2020; PN on 08/20/2020    Authorization Time Period Recert 05/08/960- 8/36/6294; PN on 08/07/5463; Recert 0/35/4656- 81/27/5170; PN on 10/01/2020    PT Start Time 1145    PT Stop Time 1220    PT Time Calculation (min) 35 min    Equipment Utilized During Treatment Gait belt    Activity Tolerance Patient tolerated treatment well;Patient limited by fatigue    Behavior During Therapy WFL for tasks assessed/performed             Past Medical History:  Diagnosis Date   Acute blood loss anemia    Arthritis    Cancer ([place])    skin   Central cord syndrome at C6 level of cervical spinal cord ([place]) 11/29/2017   Hypertension    Protein-calorie malnutrition, severe ([place]) 01/24/2018   S/P insertion of IVC (inferior vena caval) filter 01/24/2018   Tetraparesis ([place])     Past Surgical History:  Procedure Laterality Date   ANTERIOR CERVICAL DECOMP/DISCECTOMY FUSION N/A 11/29/2017   Procedure: Cervical five-six, six-seven Anterior Cervical Decompression Fusion;  Surgeon: Judith Part, MD;  Location: [place];  Service: Neurosurgery;  Laterality: N/A;  Cervical five-six, six-seven Anterior Cervical Decompression Fusion   CATARACT EXTRACTION     IR IVC FILTER PLMT / S&I /IMG GUID/MOD SED  12/08/2017    There were no vitals filed for this visit.   Subjective Assessment - 10/13/20 1207     Subjective Patient reports doing okay with no pain or issues.    Pertinent  History Pt is an 86 yo female that fell in 2019, fractured vertebrae in her neck and in her low back per family. 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 After hospital stay, pt discharged to [facility] for ~1 month, experienced severe UTI as well as bilateral DVT (IVC filter placed). Discharged to [facility], stayed for about a year, and then moved to [facility] in April 2021. Was receiving PT, but per family facility reported that did not have adequate equipment to maximize PT for her. Pt until about 1 month ago was practicing sit to stand transfers with 1-2 people, and working on static standing. Has a brace for L foot due to PF. Pt currently needs assistance with all ADLs (able to assist with donning/doffing her shirt), bed baths, and needs a hoyer lift for transfers. Able to drive power wheelchair without assistance.    Limitations Standing;Walking;House hold activities;Lifting    How long can you sit comfortably? NA    How long can you stand comfortably? unable    How long can you walk comfortably? unable    Patient Stated Goals "to stand up and walk"    Currently in Pain? No/denies            INTERVENTIONS:   Manual therapy:   PROM to BLE in seated position including hip flex/Hip ABD, Knee ext, and ankle DF BLE. Patient exhibited increased  increased tightness throughout left knee and B ankles.   Neuromuscular re-education:  Patient performed 4 trials of static standing in // bars. Mod Assist to stand today.   1) 30 sec with patient experiencing increased left knee buckling. Marland Kitchen  2) 20 sec - min assist with trunk control- again limited by left knee buckling.  3) 3 min 21 sec- Improved overall ability to maintain posture with good hip ext  4) 45 sec- patient experienced another episode of left knee buckling so requested to stop.   Clinical Impression: Treatment limited secondary to patient reporting increased fatigue and requested to end early. She presented with very  limited standing ability versus previous visits - limited today by left knee buckling. She remains motivated despite difficulty standing.    Patient will benefit from continued PT services to increase mobility and strength to improve patient's quality of life.                              PT Education - 10/13/20 1214     Education Details standing postural ed    Person(s) Educated Patient    Methods Explanation;Demonstration;Tactile cues;Verbal cues    Comprehension Verbalized understanding;Returned demonstration;Verbal cues required;Tactile cues required;Need further instruction              PT Short Term Goals - 03/10/20 2211       PT SHORT TERM GOAL #1   Title The patient will perform initial HEP with minimum assistance in order to improve strength and function.    Baseline Patient demonstrating independence with initial HEP as of 03/10/2020 with no questions or difficulty.    Time 4    Period Weeks    Status Achieved    Target Date 02/11/20               PT Long Term Goals - 09/24/20 1427       PT LONG TERM GOAL #1   Title The patient will be compliant with finalized HEP with minimum assistance in preparation for self management and maintenance of condition.    Baseline 03/10/2020- Patient familiar with initial HEP and will keep goal active as HEP becomes more progressive including possible transfers and standing activities. 04/07/2020- Patient continues to report compliance with LE strengthening home exercises without questions or concerns. 04/21/2020- Patient able to verbalize and demonstrate good understanding of current HEP including some supine and seated LE strengthening exercises. Reviewed and patient performed 10 reps today with minimal cueing. Will keep goal active to progress HEP as appropriate. 05/28/2020- patient reports continues to perform LE strengthening as able and some help from caregiver as able. No changes at this time to home  program as patient is limited to supine/seated therex. 07/02/2020- Patient continues to report compliance with current supine and seated LE strengtheing home program and states no questions or concerns regarding her plan.    Time 12    Period Weeks    Status Achieved      PT LONG TERM GOAL #2   Title The patient will demonstrate at least 10 point improvement on FOTO score indicating an improved ability to perform functional activities.    Baseline on eval (/720) score 12; 10/22/19: 12; 12/10/19: 18, 12/13: 18: 04/07/2020- Will assess next visit. 07/02/2020- Will obtain next visit. 09/24/2020= Will obtain next visit.    Time 12    Period Weeks    Status On-going    Target Date 12/17/20  PT LONG TERM GOAL #3   Title The patient will demonstrate lateral scooting  for assistance with transfers with minA to maximize independence.    Baseline 10/22/19: Pt requires modA+1 for lateral scooting; 12/10/19: modA+1, 12/13: modA-maxA, 12/27: modA-maxA, 03/10/2020- ModA-MaxA- increased verbal cues and visual demonstration. 04/07/2020- Continued Max assist to perform forward/lateral scoot. 04/21/2020- Patient was able to demo slight lateral scoot with CGA approx 12 in then fatigued requiring max assist- she is able to scoot forward by leaning without significant issues. 05/28/2020- While sitting in chair- patient able to perform forward and lateral scoot with VC/visual demo and increased time allowed- CGA today but not consistent yet- will keep goal active at this time to continue to focus on strengthening and improving technique. 07/02/2020- Patient able to demonstrate minimal ability to laterally shift weigh on mat table requiring max assist yet is able to forward scoot out to edge of mat table with min Assist. 08/20/2020 - Patient was able to demonstrate minimal lateral scooting at edge of mat while holding feet still so they did not slip on tile surface. She continues to be able to demonstrate scoot forward and uses tilt  option to recline to position safely back into seat of power wheelchair. 09/25/2020- Patient able to sit on mat and minimally scoot laterally but requires min/mod A for efficient scooting. Will end goal at this time due to plateau in progress with scooting over extended time.    Time 12    Period Weeks    Status Not Met    Target Date 12/17/20      PT LONG TERM GOAL #4   Title The patient will demonstrate a squat pivot transfer with minimum assistance to maximize independence and mobility.    Baseline 10/22/19: unable to safely attempt at this time; 12/10/19: unable to safely attempt at this time, 01/14/20: unable to safety attempt at this time. 03/10/2020- Patient able to perform Stand pivot transfer with Maximal assist. 04/07/2020- Patient continues to require Max assist with sit to stand- will assist SPT next visit. 04/21/2020- Patient presents with Max assist to perform Sit to stand Transfer- unable to pivot or move Left LE well without difficulty. 07/02/2020= Max assist with SPT and minimal ability to pivot. 7/202/2022-Patient able to stand with minimal assist this visit (as good as CGA last visit) and able to move right foot to pivot and performing with moderate assist today. 09/24/2020- Patient able to perform sit to stand with Min assist from lift chair. Patient has demo inconsistency and will keep goal active.    Time 12    Period Weeks    Status On-going    Target Date 12/17/20      PT LONG TERM GOAL #5   Title The patient will demonstrate sitting without UE support for 2-5 minutes at EOB to improve participation and maximize independence with ADLs.    Baseline 10/22/19: Pt able to sit unsupported for at least 2 minutes at edge of bed. 04/21/2020- Patient continues to demo good sitting at edge of mat > 5 miin without difficulty or back support.    Time 12    Period Weeks    Status Achieved      Additional Long Term Goals   Additional Long Term Goals Yes      PT LONG TERM GOAL #6   Title  Patient will demonstrate improved functional LE strength as seen by consistent ability to stand > 2 min (3 out of 4 trials) with Mod/Max A for  improved LE strength with transfers.    Baseline 05/28/2020- Patient able to inconsistent stand 1-2 min right now in //bars with Mod/Max A. 07/02/2020- Patient was able to progress last 2 visit to standing using bilateral platform attachment between 1-2 min which is a progression from the parallel bars. 08/20/2020- Patient performed static standing with min- mod A using bilateral platform attachment on walker for 6 min 30 sec today with assist for trunk control and intermittent verbal cues for posture. 09/24/2020- Patient has consistently been able to stand >2 min in // bars and when using bilateral Platform walker.    Time 12    Period Weeks    Status Achieved      PT LONG TERM GOAL #7   Title Patient/caregiver will demonstrate assisted transfer using [place] consistently Independently for improved ability to transfer at home from lift chair to bed.    Baseline 09/24/2020- Patient requires hoyer lift (Dependent) for safe transfers.    Time 12    Period Weeks    Status New    Target Date 12/17/20                   Plan - 10/13/20 2053     Clinical Impression Statement Treatment limited secondary to patient reporting increased fatigue and requested to end early. She presented with very limited standing ability versus previous visits - limited today by left knee buckling. She remains motivated despite difficulty standing.    Patient will benefit from continued PT services to increase mobility and strength to improve patient's quality of life    Personal Factors and Comorbidities Age;Time since onset of injury/illness/exacerbation;Comorbidity 3+;Fitness    Comorbidities HTN, quadriparesis, history of DVT, neurogenic bladder    Examination-Activity Limitations Bathing;Hygiene/Grooming;Squat;Bed Mobility;Lift;Bend;Stand;Futures trader;Toileting;Self Feeding;Transfers;Continence;Sit;Dressing;Sleep;Carry    Examination-Participation Restrictions Church;Laundry;Cleaning;Medication Management;Community Activity;Meal Prep;Interpersonal Relationship    Stability/Clinical Decision Making Evolving/Moderate complexity    Rehab Potential Fair    PT Frequency 2x / week    PT Duration 12 weeks    PT Treatment/Interventions ADLs/Self Care Home Management;Electrical Stimulation;Therapeutic activities;Wheelchair mobility training;Vasopneumatic Device;Joint Manipulations;Vestibular;Passive range of motion;Patient/family education;Therapeutic exercise;DME Instruction;Biofeedback;Aquatic Therapy;Moist Heat;Gait training;Balance training;Orthotic Fit/Training;Dry needling;Energy conservation;Taping;Splinting;Neuromuscular re-education;Cryotherapy;Ultrasound;Functional mobility training    PT Next Visit Plan Continue with progressive Standing/transfer training.    PT Home Exercise Plan no changes    Consulted and Agree with Plan of Care Patient             Patient will benefit from skilled therapeutic intervention in order to improve the following deficits and impairments:  Abnormal gait, Decreased balance, Decreased endurance, Decreased mobility, Difficulty walking, Hypomobility, Impaired sensation, Decreased range of motion, Improper body mechanics, Impaired perceived functional ability, Decreased activity tolerance, Decreased knowledge of use of DME, Decreased safety awareness, Decreased strength, Impaired flexibility, Impaired UE functional use, Postural dysfunction  Visit Diagnosis: Abnormality of gait and mobility  Difficulty in walking, not elsewhere classified  Muscle weakness (generalized)  Unsteadiness on feet     Problem List Patient Active Problem List   Diagnosis Date Noted   Acute appendicitis with appendiceal abscess 05/30/2018   Hypocalcemia 02/15/2018   Dysuria 02/15/2018   Bilateral lower extremity  edema 02/11/2018   Vaginal yeast infection 01/30/2018   Chest tightness 01/27/2018   Healthcare-associated pneumonia 01/25/2018   Pleural effusion, not elsewhere classified 01/25/2018   Acute deep vein thrombosis (DVT) of left lower extremity ([place]) 01/24/2018   Acute deep vein thrombosis (DVT) of right lower extremity ([place]) 01/24/2018   Chronic allergic rhinitis  01/24/2018   Depression with anxiety 01/24/2018   UTI due to Klebsiella species 01/24/2018   Protein-calorie malnutrition, severe ([place]) 01/24/2018   S/P insertion of IVC (inferior vena caval) filter 01/24/2018   Reactive depression    Hypokalemia    Leukocytosis    Essential hypertension    Trauma    Tetraparesis ([facility])    Neuropathic pain    Neurogenic bowel    Neurogenic bladder    Benign essential HTN    Acute blood loss anemia    Central cord syndrome at C6 level of cervical spinal cord ([place]) 11/29/2017   Central cord syndrome ([place]) 11/29/2017    Lewis Moccasin, PT 10/13/2020, 9:14 PM  [place] MAIN [facility] SERVICES [address] [place], [place], [address] Phone: [phone]   Fax:  [phone]  Name: TIMAYA BOJARSKI MRN: [id number] Date of Birth: [birth year]

## 2020-10-15 ENCOUNTER — Other Ambulatory Visit: Payer: Self-pay

## 2020-10-15 ENCOUNTER — Encounter: Payer: Self-pay | Admitting: Occupational Therapy

## 2020-10-15 ENCOUNTER — Ambulatory Visit: Payer: Medicare HMO | Admitting: Physical Therapy

## 2020-10-15 ENCOUNTER — Ambulatory Visit: Payer: Medicare HMO | Admitting: Occupational Therapy

## 2020-10-15 DIAGNOSIS — R2681 Unsteadiness on feet: Secondary | ICD-10-CM

## 2020-10-15 DIAGNOSIS — M6281 Muscle weakness (generalized): Secondary | ICD-10-CM | POA: Diagnosis not present

## 2020-10-15 DIAGNOSIS — R269 Unspecified abnormalities of gait and mobility: Secondary | ICD-10-CM

## 2020-10-15 DIAGNOSIS — S14129S Central cord syndrome at unspecified level of cervical spinal cord, sequela: Secondary | ICD-10-CM

## 2020-10-15 DIAGNOSIS — R278 Other lack of coordination: Secondary | ICD-10-CM

## 2020-10-15 DIAGNOSIS — R2689 Other abnormalities of gait and mobility: Secondary | ICD-10-CM

## 2020-10-15 DIAGNOSIS — R262 Difficulty in walking, not elsewhere classified: Secondary | ICD-10-CM

## 2020-10-15 NOTE — Therapy (Signed)
[place] MAIN [facility] SERVICES [address] [place], [place], [address] Phone: [phone]   Fax:  [phone]  Occupational Therapy Treatment  Patient Details  Name: Sherry [name] MRN: [id number] Date of Birth: January 14, 1935 No data recorded  Encounter Date: 10/15/2020   OT End of Session - 10/15/20 1211     Visit Number 16    Number of Visits 63    Date for OT Re-Evaluation 10/22/20    Authorization Time Period Progress report period starting 09/08/2020    OT Start Time 1100    OT Stop Time 1145    OT Time Calculation (min) 45 min    Equipment Utilized During Treatment moist heat    Behavior During Therapy [facility] for tasks assessed/performed             Past Medical History:  Diagnosis Date   Acute blood loss anemia    Arthritis    Cancer ([place])    skin   Central cord syndrome at C6 level of cervical spinal cord ([place]) 11/29/2017   Hypertension    Protein-calorie malnutrition, severe ([place]) 01/24/2018   S/P insertion of IVC (inferior vena caval) filter 01/24/2018   Tetraparesis ([place])     Past Surgical History:  Procedure Laterality Date   ANTERIOR CERVICAL DECOMP/DISCECTOMY FUSION N/A 11/29/2017   Procedure: Cervical five-six, six-seven Anterior Cervical Decompression Fusion;  Surgeon: Judith Part, MD;  Location: [place];  Service: Neurosurgery;  Laterality: N/A;  Cervical five-six, six-seven Anterior Cervical Decompression Fusion   CATARACT EXTRACTION     IR IVC FILTER PLMT / S&I /IMG GUID/MOD SED  12/08/2017    There were no vitals filed for this visit.   Subjective Assessment - 10/15/20 1210     Subjective  Patient reports that he is doing well today    Patient is accompanied by: Family member    Pertinent History Patient s/p fall November 29, 2017 resulting in diagnosis of central cord syndrome at C 6 level.  She has had therapy in multiple venues but with recent move to [facility], therapy staff recommended she seek  outpatient therapy for her needs.    Patient Stated Goals Patient would like to be able to move better in bed, stand and perform self care tasks.            OT TREATMENT     Pt. tolerated bilateral shoulder flexion, extension, abduction, elbow flexion, extension forearm supination AAROM/AROM. PROM bilateral wrist extension, digit MP, PIP, and DIP flexion, and extension, thumb radial, and palmar abduction in conjunction with moist heat. Pt. Worked on BUE strengthening, and reciprocal motion using the UBE while sitting for 56mn. with no resistance. Constant monitoring was provided. Pt. worked on reaching with abduction reaching out to the far left, and the far right using the [employer] horizontal ring tower.  Patient worked reaching out for [employer] ring with the left UE, and passing it to the right then reaching out with the right to place them on the 2nd, and 3rd horizontal rungs. Pt. was able to perform the task more freely today. Pt. worked on Bilateral UE reaching, and ROM in order to improve functional reaching for ADL, IADL tasks, and haircare.   Pt. continues to make progress overall, and is improving with BUE reaching in preparation for improving overhead ADLs, and haircare. Pt. continues to tolerate ROM, moist heat modality, and functional reaching without pain. Pt. was able  to reach out to the far  left, and far right to move the [employer] rings  through the 2nd, and 3rd horizontal rungs of progressively increasing heights with both the right, and left UEs. Pt. required less time to complete, and was able to reach further with no leaning noted today during reaching tasks. Pt. was able to tolerate the UBE for 8 min. With the UBE elevated for increased ROM. Pt. tolerated ROM well without reports of pain, or discomfort. Pt. continues to benefit from working on improving ROM in order to work towards increasing engagement of her bilateral hands during ADLs, and IADL tasks.                                      OT Education - 10/15/20 1211     Education Details Bilateral upper extremity range of motion with functional reaching to the far right and left    Person(s) Educated Patient;Caregiver(s)    Methods Explanation;Demonstration    Comprehension Verbalized understanding;Need further instruction                 OT Long Term Goals - 09/08/20 1100       OT LONG TERM GOAL #1   Title Patient and caregiver will demonstrate understanding of home exercise program for ROM.    Baseline Pt. continues to have a restorative aide rehab aide assist her wih exercises at [facility]. 8/8: Independently completes/directs HEP mulitple times/week    Time 12    Period Weeks    Status Partially Met    Target Date 10/22/20      OT LONG TERM GOAL #4   Title Patient will demonstrate improved composite finger flexion to hold deodorant to apply to underarms.    Baseline Pt. continues to present with improving finger flexion of R hand for a partial gross grasp, but unable to grasp and hold items. pt. conitnues to present with limited digit flexion of L hand. Pt. has active left thumb abduction. 8/8: holds cup with 2 hands, continues to have limited composite finger flexion L worse than R    Time 12    Period Weeks    Status On-going    Target Date 10/22/20      OT LONG TERM GOAL #6   Title Patient will increase right UE ROM to comb the back of her hair with modified independence.    Baseline 07/30/2020: Pt. is now able to comb the back of her hair with a pic, however continues to work on completing the task efficiently. 8/8: Achieves with great effort, has assistance for efficiancy    Time 12    Period Weeks    Status Partially Met      OT LONG TERM GOAL #7   Title Pt. will independently, and efficiently send text messages on her phone.    Baseline 07/30/2020: Pt. continues to present with limited BUE strength, and [facility] skills. 8/8: independtly drafts text messages,  continues to work on accuracy and efficiancy 2/2 limited [facility]    Time 12    Period Weeks    Status Partially Met    Target Date 10/22/20      OT LONG TERM GOAL #8   Title Pt. will write, and sign her name with 100% legibility, and modified independence.    Baseline Pt. continues to  consistently filling out her daily menu, and writing for crossword puzzles. Pt. is able to maintain grasp on  a wide width pen. [place] to work on Media pl[employer]. 8/8: writes daily, continues to work on improving speed    Time 12    Period Weeks    Status Partially Met    Target Date 10/22/20                   Plan - 10/15/20 1211     Clinical Impression Statement Pt. continues to make progress overall, and is improving with BUE reaching in preparation for improving overhead ADLs, and haircare. Pt. continues to tolerate ROM, moist heat modality, and functional reaching without pain. Pt. was able  to reach out to the far left, and far right to move the [employer] rings  through the 2nd, and 3rd horizontal rungs of progressively increasing heights with both the right, and left UEs. Pt. required less time to complete, and was able to reach further with no leaning noted today during reaching tasks. Pt. was able to tolerate the UBE for 8 min. With the UBE elevated for increased ROM. Pt. tolerated ROM well without reports of pain, or discomfort. Pt. continues to benefit from working on improving ROM in order to work towards increasing engagement of her bilateral hands during ADLs, and IADL tasks.          OT Occupational Profile and History Detailed Assessment- Review of Records and additional review of physical, cognitive, psychosocial history related to current functional performance    Occupational performance deficits (Please refer to evaluation for details): ADL's;IADL's;Leisure;Social Participation    Body Structure / Function / Physical Skills  ADL;Continence;Dexterity;Flexibility;Strength;ROM;Balance;Coordination;FMC;IADL;Endurance;UE functional use;Decreased knowledge of use of DME;GMC    Psychosocial Skills Environmental  Adaptations;Habits;Routines and Behaviors    Rehab Potential Fair    Clinical Decision Making Several treatment options, min-mod task modification necessary    Comorbidities Affecting Occupational Performance: Presence of comorbidities impacting occupational performance    Comorbidities impacting occupational performance description: contractures of bilateral hands, dependent transfers,    Modification or Assistance to Complete Evaluation  No modification of tasks or assist necessary to complete eval    OT Frequency 2x / week    OT Duration 12 weeks    OT Treatment/Interventions Self-care/ADL training;Cryotherapy;Therapeutic exercise;DME and/or AE instruction;Balance training;Neuromuscular education;Manual Therapy;Splinting;Moist Heat;Passive range of motion;Therapeutic activities;Patient/family education    Consulted and Agree with Plan of Care Patient             Patient will benefit from skilled therapeutic intervention in order to improve the following deficits and impairments:   Body Structure / Function / Physical Skills: ADL, Continence, Dexterity, Flexibility, Strength, ROM, Balance, Coordination, FMC, IADL, Endurance, UE functional use, Decreased knowledge of use of DME, GMC   Psychosocial Skills: Environmental  Adaptations, Habits, Routines and Behaviors   Visit Diagnosis: Muscle weakness (generalized)  Other lack of coordination    Problem List Patient Active Problem List   Diagnosis Date Noted   Acute appendicitis with appendiceal abscess 05/30/2018   Hypocalcemia 02/15/2018   Dysuria 02/15/2018   Bilateral lower extremity edema 02/11/2018   Vaginal yeast infection 01/30/2018   Chest tightness 01/27/2018   Healthcare-associated pneumonia 01/25/2018   Pleural effusion, not elsewhere  classified 01/25/2018   Acute deep vein thrombosis (DVT) of left lower extremity ([place]) 01/24/2018   Acute deep vein thrombosis (DVT) of right lower extremity ([place]) 01/24/2018   Chronic allergic rhinitis 01/24/2018   Depression with anxiety 01/24/2018   UTI due to Klebsiella species 01/24/2018   Protein-calorie malnutrition, severe ([place]) 01/24/2018   S/P insertion  of IVC (inferior vena caval) filter 01/24/2018   Reactive depression    Hypokalemia    Leukocytosis    Essential hypertension    Trauma    Tetraparesis (H[facility])    Neuropathic pain    Neurogenic bowel    Neurogenic bladder    Benign essential HTN    Acute blood loss anemia    Central cord syndrome at C6 level of cervical spinal cord ([place]) 11/29/2017   Central cord syndrome [facility]) 11/29/2017    Harrel Carina, MS, OTR/L 10/15/2020, 12:13 PM  [place] MAIN [facility] SERVICES [address] [place], [place], [address] Phone: [phone]   Fax:  [phone]  Name: CHARLIZE HATHAWAY MRN: [id number] Date of Birth: [birth year]

## 2020-10-15 NOTE — Therapy (Signed)
[place] MAIN [facility] SERVICES [address] [place], [place], [address] Phone: [phone]   Fax:  [phone]  Physical Therapy Treatment  Patient Details  Name: Sherry [name] MRN: [id number] Date of Birth: 1934-03-20 No data recorded  Encounter Date: 10/15/2020   PT End of Session - 10/15/20 1257     Visit Number 93    Number of Visits 104    Date for PT Re-Evaluation 12/17/20    Authorization Type [employer] FOTO performed by PT on eval (7/20), score 12, Progress note on 03/10/2020; PN on 08/20/2020    Authorization Time Period Recert 06/09/9772- 1/42/3953; PN on 2/0/2334; Recert 3/56/8616- 83/72/9021; PN on 10/01/2020    PT Start Time 1145    PT Stop Time 1228    PT Time Calculation (min) 43 min    Equipment Utilized During Treatment Gait belt    Activity Tolerance Patient tolerated treatment well;Patient limited by fatigue    Behavior During Therapy WFL for tasks assessed/performed             Past Medical History:  Diagnosis Date   Acute blood loss anemia    Arthritis    Cancer ([place])    skin   Central cord syndrome at C6 level of cervical spinal cord ([place]) 11/29/2017   Hypertension    Protein-calorie malnutrition, severe ([place]) 01/24/2018   S/P insertion of IVC (inferior vena caval) filter 01/24/2018   Tetraparesis ([place])     Past Surgical History:  Procedure Laterality Date   ANTERIOR CERVICAL DECOMP/DISCECTOMY FUSION N/A 11/29/2017   Procedure: Cervical five-six, six-seven Anterior Cervical Decompression Fusion;  Surgeon: Judith Part, MD;  Location: [facility];  Service: Neurosurgery;  Laterality: N/A;  Cervical five-six, six-seven Anterior Cervical Decompression Fusion   CATARACT EXTRACTION     IR IVC FILTER PLMT / S&I /IMG GUID/MOD SED  12/08/2017    There were no vitals filed for this visit.   Subjective Assessment - 10/15/20 1256     Subjective Patient reports doing okay with no pain. No questions or  concerns.    Pertinent History Pt is an 86 yo female that fell in 2019, fractured vertebrae in her neck and in her low back per family. 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 After hospital stay, pt discharged to [facility] for ~1 month, experienced severe UTI as well as bilateral DVT (IVC filter placed). Discharged to [facility], stayed for about a year, and then moved to [facility] in April 2021. Was receiving PT, but per family facility reported that did not have adequate equipment to maximize PT for her. Pt until about 1 month ago was practicing sit to stand transfers with 1-2 people, and working on static standing. Has a brace for L foot due to PF. Pt currently needs assistance with all ADLs (able to assist with donning/doffing her shirt), bed baths, and needs a hoyer lift for transfers. Able to drive power wheelchair without assistance.    Limitations Standing;Walking;House hold activities;Lifting    How long can you sit comfortably? NA    How long can you stand comfortably? unable    How long can you walk comfortably? unable    Patient Stated Goals "to stand up and walk"    Currently in Pain? No/denies               INTERVENTIONS:    Manual therapy:    PROM/stretching to BLE in reclined position in PWC including hamstrings, hip ABD,  knee ext, and ankle DF BLE. Patient exhibited increased tightness throughout B hamstrings, left knee and B ankles.    PWC reclined - SL leg press with moderate to heavy manual resistance, x15 each LE;  Seated LE - Hip march- AAROM 2 x 15 reps each Seated LE - Knee ext w/ 3 second hold - AROM on left and AAROM on right to achieve full extension, 2 x 12 reps. Seated hip add squeeze on soft red ball with 5 sec hold, 2 x 15 reps.  Seated Gluteal squeeze with 5 sec hold, 2 x 15 reps    Clinical Impression: Patient pleasant and motivated throughout session. Seated stretching and strengthening were performed. Increased intensity of  strengthening exercises with increased reps and isometric holds; therex performed to fatigue. Patient will benefit from continued PT services to increase mobility and strength to improve patient's quality of life.         PT Short Term Goals - 03/10/20 2211       PT SHORT TERM GOAL #1   Title The patient will perform initial HEP with minimum assistance in order to improve strength and function.    Baseline Patient demonstrating independence with initial HEP as of 03/10/2020 with no questions or difficulty.    Time 4    Period Weeks    Status Achieved    Target Date 02/11/20               PT Long Term Goals - 09/24/20 1427       PT LONG TERM GOAL #1   Title The patient will be compliant with finalized HEP with minimum assistance in preparation for self management and maintenance of condition.    Baseline 03/10/2020- Patient familiar with initial HEP and will keep goal active as HEP becomes more progressive including possible transfers and standing activities. 04/07/2020- Patient continues to report compliance with LE strengthening home exercises without questions or concerns. 04/21/2020- Patient able to verbalize and demonstrate good understanding of current HEP including some supine and seated LE strengthening exercises. Reviewed and patient performed 10 reps today with minimal cueing. Will keep goal active to progress HEP as appropriate. 05/28/2020- patient reports continues to perform LE strengthening as able and some help from caregiver as able. No changes at this time to home program as patient is limited to supine/seated therex. 07/02/2020- Patient continues to report compliance with current supine and seated LE strengtheing home program and states no questions or concerns regarding her plan.    Time 12    Period Weeks    Status Achieved      PT LONG TERM GOAL #2   Title The patient will demonstrate at least 10 point improvement on FOTO score indicating an improved ability to  perform functional activities.    Baseline on eval (/720) score 12; 10/22/19: 12; 12/10/19: 18, 12/13: 18: 04/07/2020- Will assess next visit. 07/02/2020- Will obtain next visit. 09/24/2020= Will obtain next visit.    Time 12    Period Weeks    Status On-going    Target Date 12/17/20      PT LONG TERM GOAL #3   Title The patient will demonstrate lateral scooting  for assistance with transfers with minA to maximize independence.    Baseline 10/22/19: Pt requires modA+1 for lateral scooting; 12/10/19: modA+1, 12/13: modA-maxA, 12/27: modA-maxA, 03/10/2020- ModA-MaxA- increased verbal cues and visual demonstration. 04/07/2020- Continued Max assist to perform forward/lateral scoot. 04/21/2020- Patient was able to demo slight lateral scoot with CGA  approx 12 in then fatigued requiring max assist- she is able to scoot forward by leaning without significant issues. 05/28/2020- While sitting in chair- patient able to perform forward and lateral scoot with VC/visual demo and increased time allowed- CGA today but not consistent yet- will keep goal active at this time to continue to focus on strengthening and improving technique. 07/02/2020- Patient able to demonstrate minimal ability to laterally shift weigh on mat table requiring max assist yet is able to forward scoot out to edge of mat table with min Assist. 08/20/2020 - Patient was able to demonstrate minimal lateral scooting at edge of mat while holding feet still so they did not slip on tile surface. She continues to be able to demonstrate scoot forward and uses tilt option to recline to position safely back into seat of power wheelchair. 09/25/2020- Patient able to sit on mat and minimally scoot laterally but requires min/mod A for efficient scooting. Will end goal at this time due to plateau in progress with scooting over extended time.    Time 12    Period Weeks    Status Not Met    Target Date 12/17/20      PT LONG TERM GOAL #4   Title The patient will demonstrate a  squat pivot transfer with minimum assistance to maximize independence and mobility.    Baseline 10/22/19: unable to safely attempt at this time; 12/10/19: unable to safely attempt at this time, 01/14/20: unable to safety attempt at this time. 03/10/2020- Patient able to perform Stand pivot transfer with Maximal assist. 04/07/2020- Patient continues to require Max assist with sit to stand- will assist SPT next visit. 04/21/2020- Patient presents with Max assist to perform Sit to stand Transfer- unable to pivot or move Left LE well without difficulty. 07/02/2020= Max assist with SPT and minimal ability to pivot. 7/202/2022-Patient able to stand with minimal assist this visit (as good as CGA last visit) and able to move right foot to pivot and performing with moderate assist today. 09/24/2020- Patient able to perform sit to stand with Min assist from lift chair. Patient has demo inconsistency and will keep goal active.    Time 12    Period Weeks    Status On-going    Target Date 12/17/20      PT LONG TERM GOAL #5   Title The patient will demonstrate sitting without UE support for 2-5 minutes at EOB to improve participation and maximize independence with ADLs.    Baseline 10/22/19: Pt able to sit unsupported for at least 2 minutes at edge of bed. 04/21/2020- Patient continues to demo good sitting at edge of mat > 5 miin without difficulty or back support.    Time 12    Period Weeks    Status Achieved      Additional Long Term Goals   Additional Long Term Goals Yes      PT LONG TERM GOAL #6   Title Patient will demonstrate improved functional LE strength as seen by consistent ability to stand > 2 min (3 out of 4 trials) with Mod/Max A for improved LE strength with transfers.    Baseline 05/28/2020- Patient able to inconsistent stand 1-2 min right now in //bars with Mod/Max A. 07/02/2020- Patient was able to progress last 2 visit to standing using bilateral platform attachment between 1-2 min which is a progression  from the parallel bars. 08/20/2020- Patient performed static standing with min- mod A using bilateral platform attachment on walker for 6 min  30 sec today with assist for trunk control and intermittent verbal cues for posture. 09/24/2020- Patient has consistently been able to stand >2 min in // bars and when using bilateral Platform walker.    Time 12    Period Weeks    Status Achieved      PT LONG TERM GOAL #7   Title Patient/caregiver will demonstrate assisted transfer using [place] consistently Independently for improved ability to transfer at home from lift chair to bed.    Baseline 09/24/2020- Patient requires hoyer lift (Dependent) for safe transfers.    Time 12    Period Weeks    Status New    Target Date 12/17/20                   Plan - 10/15/20 1258     Clinical Impression Statement Patient pleasant and motivated throughout session. Seated stretching and strengthening were performed. Increased intensity of strengthening exercises with increased reps and isometric holds; therex performed to fatigue. Patient will benefit from continued PT services to increase mobility and strength to improve patient's quality of life.    Personal Factors and Comorbidities Age;Time since onset of injury/illness/exacerbation;Comorbidity 3+;Fitness    Comorbidities HTN, quadriparesis, history of DVT, neurogenic bladder    Examination-Activity Limitations Bathing;Hygiene/Grooming;Squat;Bed Mobility;Lift;Bend;Stand;Engineer, manufacturing;Toileting;Self Feeding;Transfers;Continence;Sit;Dressing;Sleep;Carry    Examination-Participation Restrictions Church;Laundry;Cleaning;Medication Management;Community Activity;Meal Prep;Interpersonal Relationship    Stability/Clinical Decision Making Evolving/Moderate complexity    Rehab Potential Fair    PT Frequency 2x / week    PT Duration 12 weeks    PT Treatment/Interventions ADLs/Self Care Home Management;Electrical Stimulation;Therapeutic  activities;Wheelchair mobility training;Vasopneumatic Device;Joint Manipulations;Vestibular;Passive range of motion;Patient/family education;Therapeutic exercise;DME Instruction;Biofeedback;Aquatic Therapy;Moist Heat;Gait training;Balance training;Orthotic Fit/Training;Dry needling;Energy conservation;Taping;Splinting;Neuromuscular re-education;Cryotherapy;Ultrasound;Functional mobility training    PT Next Visit Plan Continue with progressive sanding/transfer training.    PT Home Exercise Plan no changes    Consulted and Agree with Plan of Care Patient             Patient will benefit from skilled therapeutic intervention in order to improve the following deficits and impairments:  Abnormal gait, Decreased balance, Decreased endurance, Decreased mobility, Difficulty walking, Hypomobility, Impaired sensation, Decreased range of motion, Improper body mechanics, Impaired perceived functional ability, Decreased activity tolerance, Decreased knowledge of use of DME, Decreased safety awareness, Decreased strength, Impaired flexibility, Impaired UE functional use, Postural dysfunction  Visit Diagnosis: Muscle weakness (generalized)  Difficulty in walking, not elsewhere classified  Other lack of coordination  Unsteadiness on feet  Central cord syndrome, sequela ([facility])  Abnormality of gait and mobility  Other abnormalities of gait and mobility     Problem List Patient Active Problem List   Diagnosis Date Noted   Acute appendicitis with appendiceal abscess 05/30/2018   Hypocalcemia 02/15/2018   Dysuria 02/15/2018   Bilateral lower extremity edema 02/11/2018   Vaginal yeast infection 01/30/2018   Chest tightness 01/27/2018   Healthcare-associated pneumonia 01/25/2018   Pleural effusion, not elsewhere classified 01/25/2018   Acute deep vein thrombosis (DVT) of left lower extremity ([place]) 01/24/2018   Acute deep vein thrombosis (DVT) of right lower extremity (H[facility]) 01/24/2018   Chronic  allergic rhinitis 01/24/2018   Depression with anxiety 01/24/2018   UTI due to Klebsiella species 01/24/2018   Protein-calorie malnutrition, severe ([place]) 01/24/2018   S/P insertion of IVC (inferior vena caval) filter 01/24/2018   Reactive depression    Hypokalemia    Leukocytosis    Essential hypertension    Trauma    Tetraparesis ([place])  Neuropathic pain    Neurogenic bowel    Neurogenic bladder    Benign essential HTN    Acute blood loss anemia    Central cord syndrome at C6 level of cervical spinal cord ([facility]) 11/29/2017   Central cord syndrome ([place]) 11/29/2017    Patrina Levering PT, DPT  Ramonita Lab, PT 10/15/2020, 1:01 PM  [place] MAIN [facility] SERVICES [address] [place], [place], [address] Phone: [phone]   Fax:  [phone]  Name: Sherry [name] MRN: [id number] Date of Birth: 12/22/1934

## 2020-10-20 ENCOUNTER — Encounter: Payer: Self-pay | Admitting: Occupational Therapy

## 2020-10-20 ENCOUNTER — Other Ambulatory Visit: Payer: Self-pay

## 2020-10-20 ENCOUNTER — Ambulatory Visit: Payer: Medicare HMO

## 2020-10-20 ENCOUNTER — Ambulatory Visit: Payer: Medicare HMO | Admitting: Occupational Therapy

## 2020-10-20 DIAGNOSIS — R262 Difficulty in walking, not elsewhere classified: Secondary | ICD-10-CM

## 2020-10-20 DIAGNOSIS — M6281 Muscle weakness (generalized): Secondary | ICD-10-CM | POA: Diagnosis not present

## 2020-10-20 DIAGNOSIS — R278 Other lack of coordination: Secondary | ICD-10-CM

## 2020-10-20 DIAGNOSIS — R269 Unspecified abnormalities of gait and mobility: Secondary | ICD-10-CM

## 2020-10-20 NOTE — Therapy (Signed)
[place] [facility] SERVICES [address] [place], [place], [address] Phone: [phone]   Fax:  [phone]  Occupational Therapy Treatment/Recertification  Occupational Therapy Progress Note  Dates of reporting period  09/08/2020   to   10/20/2020   Patient Details  Name: Sherry [name] MRN: [id number] Date of Birth: Aug 06, 1934 No data recorded  Encounter Date: 10/20/2020   OT End of Session - 10/20/20 1105     Visit Number 66    Number of Visits 54    Date for OT Re-Evaluation 01/12/21    Authorization Time Period Progress report period starting 10/20/2020    OT Start Time 1100    OT Stop Time 1145    OT Time Calculation (min) 45 min    Activity Tolerance Patient tolerated treatment well    Behavior During Therapy [facility] for tasks assessed/performed             Past Medical History:  Diagnosis Date   Acute blood loss anemia    Arthritis    Cancer ([place])    skin   Central cord syndrome at C6 level of cervical spinal cord ([place]) 11/29/2017   Hypertension    Protein-calorie malnutrition, severe ([place]) 01/24/2018   S/P insertion of IVC (inferior vena caval) filter 01/24/2018   Tetraparesis ([place])     Past Surgical History:  Procedure Laterality Date   ANTERIOR CERVICAL DECOMP/DISCECTOMY FUSION N/A 11/29/2017   Procedure: Cervical five-six, six-seven Anterior Cervical Decompression Fusion;  Surgeon: Judith Part, MD;  Location: [facility];  Service: Neurosurgery;  Laterality: N/A;  Cervical five-six, six-seven Anterior Cervical Decompression Fusion   CATARACT EXTRACTION     IR IVC FILTER PLMT / S&I /IMG GUID/MOD SED  12/08/2017    There were no vitals filed for this visit.   Subjective Assessment - 10/20/20 1105     Subjective  Patient reports that he is doing well today    Patient is accompanied by: Family member    Pertinent History Patient s/p fall November 29, 2017 resulting in diagnosis of central cord syndrome at C 6  level.  She has had therapy in multiple venues but with recent move to [facility], therapy staff recommended she seek outpatient therapy for her needs.                [facility] OT Assessment - 10/20/20 1117       AROM   Right Shoulder Flexion 105 Degrees    Right Shoulder ABduction 99 Degrees    Left Shoulder Flexion 140 Degrees    Left Shoulder ABduction 115 Degrees    Right Elbow Flexion 155    Right Elbow Extension 0    Left Elbow Flexion 155    Left Elbow Extension 0    Right Wrist Extension 50 Degrees    Right Wrist Flexion 50 Degrees    Right Wrist Radial Deviation 11 Degrees    Right Wrist Ulnar Deviation 15 Degrees    Left Wrist Extension -13 Degrees    Left Wrist Flexion 84 Degrees    Left Wrist Radial Deviation 20 Degrees    Left Wrist Ulnar Deviation 15 Degrees      Right Hand AROM   R Index  MCP 0-90 85 Degrees    R Index PIP 0-100 80 Degrees    R Index DIP 0-70 30 Degrees    R Long  MCP 0-90 95 Degrees    R Long PIP 0-100 70 Degrees  R Long DIP 0-70 25 Degrees    R Ring  MCP 0-90 80 Degrees    R Ring PIP 0-100 40 Degrees    R Ring DIP 0-70 40 Degrees    R Little  MCP 0-90 75 Degrees    R Little PIP 0-100 45 Degrees    R Little DIP 0-70 25 Degrees      Right Hand PROM   R Index  MCP 0-90 85 Degrees    R Index PIP 0-100 85 Degrees    R Index DIP 0-70 30 Degrees    R Long  MCP 0-90 95 Degrees    R Long PIP 0-100 90 Degrees    R Long DIP 0-70 25 Degrees    R Ring  MCP 0-90 80 Degrees    R Ring PIP 0-100 85 Degrees    R Ring DIP 0-70 60 Degrees    R Little  MCP 0-90 85 Degrees    R Little PIP 0-100 60 Degrees    R Little DIP 0-70 20 Degrees      Left Hand AROM   L Index  MCP 0-90 50 Degrees    L Index PIP 0-100 15 Degrees    L Index DIP 0-70 35 Degrees    L Long  MCP 0-90 25 Degrees    L Long PIP 0-100 40 Degrees    L Long DIP 0-70 30 Degrees    L Ring  MCP 0-90 20 Degrees    L Ring PIP 0-100 30 Degrees    L Ring DIP 0-70 40 Degrees    L  Little  MCP 0-90 20 Degrees    L Little PIP 0-100 30 Degrees    L Little DIP 0-70 25 Degrees      Left Hand PROM   L Index  MCP 0-90 55 Degrees    L Index PIP 0-100 20 Degrees    L Index DIP 0-70 45 Degrees   -30   L Long  MCP 0-90 35 Degrees    L Long PIP 0-100 65 Degrees    L Long DIP 0-70 40 Degrees    L Ring  MCP 0-90 40 Degrees    L Ring PIP 0-100 35 Degrees    L Ring DIP 0-70 40 Degrees    L Little  MCP 0-90 35 Degrees    L Little PIP 0-100 40 Degrees    L Little DIP 0-70 40 Degrees            Pt. is making excellent progress overall. Pt. has progressed with Bilateral UE ROM, as well as digit ROM in preparation for formulating a composite fisting in anticipation for grasping hold of objects. Pt. Has improved with bilateral UE functional reach. Pt. is now able to initiate using a fork with the right hand without using the universal cuff. Pt. was able to feed herself using the fork. Pt. is progressing with brushing her hair, and is able to reach the back of her head. Pt. is unable to sustain her UEs in elevation while brushing her hair. Pt. Continues to work on improving BUE ROM in order to work towards improving functional reaching, in order to maximize independence with ADLs, and IADL tasks.                   OT Education - 10/20/20 1105     Education Details Bilateral upper extremity range of motion with functional reaching to the far right and left  Person(s) Educated Patient;Caregiver(s)    Methods Explanation;Demonstration    Comprehension Verbalized understanding;Need further instruction                 OT Long Term Goals - 10/20/20 1302       OT LONG TERM GOAL #1   Title Patient and caregiver will demonstrate understanding of home exercise program for ROM.    Baseline Pt. continues to have a restorative aide rehab aide assist her wih exercises at [facility] 8/8: Independently completes/directs HEP mulitple times/week    Time 12    Period  Weeks    Status Partially Met    Target Date 01/12/21      OT LONG TERM GOAL #4   Title Patient will demonstrate improved composite finger flexion to hold deodorant to apply to underarms.    Baseline 10/20/2020: Pt. conitnues to make steady progress with bilateral digit flexion.Pt. conitnues to present with stiifness/tightness which is hindering pt.'s ability to formulate a full composite fist. Pt. continues to present with improving finger flexion of R hand for a partial gross grasp, but unable to grasp and hold items. pt. conitnues to present with limited digit flexion of L hand. Pt. has active left thumb abduction. 8/8: holds cup with 2 hands, continues to have limited composite finger flexion L worse than R    Time 12    Period Weeks    Status On-going    Target Date 01/12/21      OT LONG TERM GOAL #6   Title Patient will increase right UE ROM to comb the back of her hair with modified independence.    Baseline 10/20/2020: Pt. continues to progress wtith RRUE ROM, and is able to use a pic for the back of her head, however is unable to brush the back of her hair. 07/30/2020: Pt. is now able to comb the back of her hair with a pic, however continues to work on completing the task efficiently. 8/8: Achieves with great effort, has assistance for efficiancy    Time 12    Period Weeks    Status Partially Met    Target Date 01/12/21      OT LONG TERM GOAL #7   Title Pt. will independently, and efficiently send text messages on her phone.    Baseline 10/20/2020: Pt. is now able to type a text message on the phone, however not efficiently taking increased time to complete. 07/30/2020: Pt. continues to present with limited BUE strength, and [facility] skills. 8/8: independtly drafts text messages, continues to work on accuracy and efficiancy 2/2 limited [facility]    Time 12    Period Weeks    Status Partially Met    Target Date 01/12/21      OT LONG TERM GOAL #8   Title Pt. will write, and sign her name with  100% legibility, and modified independence.    Baseline 10/20/2020: Pt. continues to make progress overall. Pt. continues to consistently fill out her daily menu, and writing for crossword puzzles. Pt. is able to maintain grasp on a wide width pen. [place] to work on [employer]. 8/8: writes daily, continues to work on improving speed    Time 12    Period Weeks    Status Partially Met    Target Date 01/12/21                   Plan - 10/20/20 1107     Clinical Impression Statement Pt. is making excellent  progress overall. Pt. has progressed with Bilateral UE ROM, as well as digit ROM in preparation for formulating a composite fisting in anticipation for grasping hold of objects. Pt. Has improved with bilateral UE functional reach. Pt. is now able to initiate using a fork with the right hand without using the universal cuff. Pt. was able to feed herself using the fork. Pt. is progressing with brushing her hair, and is able to reach the back of her head. Pt. is unable to sustain her UEs in elevation while brushing her hair. Pt. Continues to work on improving BUE ROM in order to work towards improving functional reaching, in order to maximize independence with ADLs, and IADL tasks.    OT Occupational Profile and History Detailed Assessment- Review of Records and additional review of physical, cognitive, psychosocial history related to current functional performance    Occupational performance deficits (Please refer to evaluation for details): ADL's;IADL's;Leisure;Social Participation    Body Structure / Function / Physical Skills ADL;Continence;Dexterity;Flexibility;Strength;ROM;Balance;Coordination;FMC;IADL;Endurance;UE functional use;Decreased knowledge of use of DME;GMC    Psychosocial Skills Environmental  Adaptations;Habits;Routines and Behaviors    Rehab Potential Fair    Clinical Decision Making Several treatment options, min-mod task modification necessary    Comorbidities  Affecting Occupational Performance: Presence of comorbidities impacting occupational performance    Comorbidities impacting occupational performance description: contractures of bilateral hands, dependent transfers,    Modification or Assistance to Complete Evaluation  No modification of tasks or assist necessary to complete eval    OT Frequency 2x / week    OT Duration 12 weeks    OT Treatment/Interventions Self-care/ADL training;Cryotherapy;Therapeutic exercise;DME and/or AE instruction;Balance training;Neuromuscular education;Manual Therapy;Splinting;Moist Heat;Passive range of motion;Therapeutic activities;Patient/family education             Patient will benefit from skilled therapeutic intervention in order to improve the following deficits and impairments:   Body Structure / Function / Physical Skills: ADL, Continence, Dexterity, Flexibility, Strength, ROM, Balance, Coordination, FMC, IADL, Endurance, UE functional use, Decreased knowledge of use of DME, GMC   Psychosocial Skills: Environmental  Adaptations, Habits, Routines and Behaviors   Visit Diagnosis: Muscle weakness (generalized)    Problem List Patient Active Problem List   Diagnosis Date Noted   Acute appendicitis with appendiceal abscess 05/30/2018   Hypocalcemia 02/15/2018   Dysuria 02/15/2018   Bilateral lower extremity edema 02/11/2018   Vaginal yeast infection 01/30/2018   Chest tightness 01/27/2018   Healthcare-associated pneumonia 01/25/2018   Pleural effusion, not elsewhere classified 01/25/2018   Acute deep vein thrombosis (DVT) of left lower extremity ([place]) 01/24/2018   Acute deep vein thrombosis (DVT) of right lower extremity ([facility]) 01/24/2018   Chronic allergic rhinitis 01/24/2018   Depression with anxiety 01/24/2018   UTI due to Klebsiella species 01/24/2018   Protein-calorie malnutrition, severe ([place]) 01/24/2018   S/P insertion of IVC (inferior vena caval) filter 01/24/2018   Reactive depression     Hypokalemia    Leukocytosis    Essential hypertension    Trauma    Tetraparesis ([facility])    Neuropathic pain    Neurogenic bowel    Neurogenic bladder    Benign essential HTN    Acute blood loss anemia    Central cord syndrome at C6 level of cervical spinal cord ([place]) 11/29/2017   Central cord syndrome ([place]) 11/29/2017    Harrel Carina, MS, OTR/L 10/20/2020, 1:13 PM  [place] [address] [place], [place], [address] Phone: [phone]   Fax:  [phone]  Name: NISHI NEISWONGER MRN: [id number] Date of Birth: 07/10/1934

## 2020-10-20 NOTE — Therapy (Signed)
[place] [facility] SERVICES [address] [place], [place], [address] Phone: [phone]   Fax:  [phone]  Physical Therapy Treatment  Patient Details  Name: Sherry [name] MRN: [id number] Date of Birth: Sep 19, 1934 No data recorded  Encounter Date: 10/20/2020   PT End of Session - 10/20/20 1153     Visit Number 94    Number of Visits 104    Date for PT Re-Evaluation 12/17/20    Authorization Type [employer] FOTO performed by PT on eval (7/20), score 12, Progress note on 03/10/2020; PN on 08/20/2020    Authorization Time Period Recert 08/07/5463- 0/35/4656; PN on 09/01/2749; Recert 7/00/1749- 44/96/7591; PN on 10/01/2020    PT Start Time 1146    PT Stop Time 1220    PT Time Calculation (min) 34 min    Equipment Utilized During Treatment Gait belt    Activity Tolerance Patient tolerated treatment well;Patient limited by fatigue    Behavior During Therapy WFL for tasks assessed/performed             Past Medical History:  Diagnosis Date   Acute blood loss anemia    Arthritis    Cancer ([place])    skin   Central cord syndrome at C6 level of cervical spinal cord ([place]) 11/29/2017   Hypertension    Protein-calorie malnutrition, severe ([place]) 01/24/2018   S/P insertion of IVC (inferior vena caval) filter 01/24/2018   Tetraparesis ([place])     Past Surgical History:  Procedure Laterality Date   ANTERIOR CERVICAL DECOMP/DISCECTOMY FUSION N/A 11/29/2017   Procedure: Cervical five-six, six-seven Anterior Cervical Decompression Fusion;  Surgeon: Judith Part, MD;  Location: [facility];  Service: Neurosurgery;  Laterality: N/A;  Cervical five-six, six-seven Anterior Cervical Decompression Fusion   CATARACT EXTRACTION     IR IVC FILTER PLMT / S&I /IMG GUID/MOD SED  12/08/2017    There were no vitals filed for this visit.   Subjective Assessment - 10/20/20 1151     Subjective Patient reports doing well so far this week. She reports she did  perform the sit to stand transfer with sabina lift at home - states went well without issues.    Pertinent History Pt is an 86 yo female that fell in 2019, fractured vertebrae in her neck and in her low back per family. 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 After hospital stay, pt discharged to [facility] for ~1 month, experienced severe UTI as well as bilateral DVT (IVC filter placed). Discharged to [facility], stayed for about a year, and then moved to [facility] in April 2021. Was receiving PT, but per family facility reported that did not have adequate equipment to maximize PT for her. Pt until about 1 month ago was practicing sit to stand transfers with 1-2 people, and working on static standing. Has a brace for L foot due to PF. Pt currently needs assistance with all ADLs (able to assist with donning/doffing her shirt), bed baths, and needs a hoyer lift for transfers. Able to drive power wheelchair without assistance.    Limitations Standing;Walking;House hold activities;Lifting    How long can you sit comfortably? NA    How long can you stand comfortably? unable    How long can you walk comfortably? unable    Patient Stated Goals "to stand up and walk"    Currently in Pain? No/denies              INTERVENTIONS:   Transfer  training: Review each step for transfer and patient then able repeat back instructions.   She was able to scoot out toward edge of her power chair and sit erect upon command. She was able to position her feet slightly toward a 45 angle for easier SPT. She was able to stand with moderate to max assist today- Able to stabilize in standing with Min A and blocking her left knee and pivot transfer. She was able to take a step (minimal) and pivot to mat.  She performed 4 total transfers back and forth from Chair to mat- with brief rest break. Patient was similar in performance from start to finish today and stopped due to fatigue.   Education provided  throughout session via VC/TC and demonstration to facilitate movement at target joints and correct muscle activation for all  exercises performed.   Clinical Impression: Patient was able to stand better today vs. Last week and able to maintain standing well today with no knee buckling. She was even able to take a small step with each leg to pivot over as previously unable. She was well motivated and described the activity as hard and achievable. Patient will benefit from continued PT services to increase mobility and strength to improve patient's quality of life.                        PT Education - 10/20/20 1152     Education Details Transfer safety    Person(s) Educated Patient    Methods Explanation;Demonstration;Tactile cues;Verbal cues    Comprehension Verbalized understanding;Returned demonstration;Verbal cues required;Tactile cues required;Need further instruction              PT Short Term Goals - 03/10/20 2211       PT SHORT TERM GOAL #1   Title The patient will perform initial HEP with minimum assistance in order to improve strength and function.    Baseline Patient demonstrating independence with initial HEP as of 03/10/2020 with no questions or difficulty.    Time 4    Period Weeks    Status Achieved    Target Date 02/11/20               PT Long Term Goals - 09/24/20 1427       PT LONG TERM GOAL #1   Title The patient will be compliant with finalized HEP with minimum assistance in preparation for self management and maintenance of condition.    Baseline 03/10/2020- Patient familiar with initial HEP and will keep goal active as HEP becomes more progressive including possible transfers and standing activities. 04/07/2020- Patient continues to report compliance with LE strengthening home exercises without questions or concerns. 04/21/2020- Patient able to verbalize and demonstrate good understanding of current HEP including some supine and seated  LE strengthening exercises. Reviewed and patient performed 10 reps today with minimal cueing. Will keep goal active to progress HEP as appropriate. 05/28/2020- patient reports continues to perform LE strengthening as able and some help from caregiver as able. No changes at this time to home program as patient is limited to supine/seated therex. 07/02/2020- Patient continues to report compliance with current supine and seated LE strengtheing home program and states no questions or concerns regarding her plan.    Time 12    Period Weeks    Status Achieved      PT LONG TERM GOAL #2   Title The patient will demonstrate at least 10 point improvement on FOTO score  indicating an improved ability to perform functional activities.    Baseline on eval (/720) score 12; 10/22/19: 12; 12/10/19: 18, 12/13: 18: 04/07/2020- Will assess next visit. 07/02/2020- Will obtain next visit. 09/24/2020= Will obtain next visit.    Time 12    Period Weeks    Status On-going    Target Date 12/17/20      PT LONG TERM GOAL #3   Title The patient will demonstrate lateral scooting  for assistance with transfers with minA to maximize independence.    Baseline 10/22/19: Pt requires modA+1 for lateral scooting; 12/10/19: modA+1, 12/13: modA-maxA, 12/27: modA-maxA, 03/10/2020- ModA-MaxA- increased verbal cues and visual demonstration. 04/07/2020- Continued Max assist to perform forward/lateral scoot. 04/21/2020- Patient was able to demo slight lateral scoot with CGA approx 12 in then fatigued requiring max assist- she is able to scoot forward by leaning without significant issues. 05/28/2020- While sitting in chair- patient able to perform forward and lateral scoot with VC/visual demo and increased time allowed- CGA today but not consistent yet- will keep goal active at this time to continue to focus on strengthening and improving technique. 07/02/2020- Patient able to demonstrate minimal ability to laterally shift weigh on mat table requiring max assist  yet is able to forward scoot out to edge of mat table with min Assist. 08/20/2020 - Patient was able to demonstrate minimal lateral scooting at edge of mat while holding feet still so they did not slip on tile surface. She continues to be able to demonstrate scoot forward and uses tilt option to recline to position safely back into seat of power wheelchair. 09/25/2020- Patient able to sit on mat and minimally scoot laterally but requires min/mod A for efficient scooting. Will end goal at this time due to plateau in progress with scooting over extended time.    Time 12    Period Weeks    Status Not Met    Target Date 12/17/20      PT LONG TERM GOAL #4   Title The patient will demonstrate a squat pivot transfer with minimum assistance to maximize independence and mobility.    Baseline 10/22/19: unable to safely attempt at this time; 12/10/19: unable to safely attempt at this time, 01/14/20: unable to safety attempt at this time. 03/10/2020- Patient able to perform Stand pivot transfer with Maximal assist. 04/07/2020- Patient continues to require Max assist with sit to stand- will assist SPT next visit. 04/21/2020- Patient presents with Max assist to perform Sit to stand Transfer- unable to pivot or move Left LE well without difficulty. 07/02/2020= Max assist with SPT and minimal ability to pivot. 7/202/2022-Patient able to stand with minimal assist this visit (as good as CGA last visit) and able to move right foot to pivot and performing with moderate assist today. 09/24/2020- Patient able to perform sit to stand with Min assist from lift chair. Patient has demo inconsistency and will keep goal active.    Time 12    Period Weeks    Status On-going    Target Date 12/17/20      PT LONG TERM GOAL #5   Title The patient will demonstrate sitting without UE support for 2-5 minutes at EOB to improve participation and maximize independence with ADLs.    Baseline 10/22/19: Pt able to sit unsupported for at least 2 minutes  at edge of bed. 04/21/2020- Patient continues to demo good sitting at edge of mat > 5 miin without difficulty or back support.    Time 12  Period Weeks    Status Achieved      Additional Long Term Goals   Additional Long Term Goals Yes      PT LONG TERM GOAL #6   Title Patient will demonstrate improved functional LE strength as seen by consistent ability to stand > 2 min (3 out of 4 trials) with Mod/Max A for improved LE strength with transfers.    Baseline 05/28/2020- Patient able to inconsistent stand 1-2 min right now in //bars with Mod/Max A. 07/02/2020- Patient was able to progress last 2 visit to standing using bilateral platform attachment between 1-2 min which is a progression from the parallel bars. 08/20/2020- Patient performed static standing with min- mod A using bilateral platform attachment on walker for 6 min 30 sec today with assist for trunk control and intermittent verbal cues for posture. 09/24/2020- Patient has consistently been able to stand >2 min in // bars and when using bilateral Platform walker.    Time 12    Period Weeks    Status Achieved      PT LONG TERM GOAL #7   Title Patient/caregiver will demonstrate assisted transfer using [place] consistently Independently for improved ability to transfer at home from lift chair to bed.    Baseline 09/24/2020- Patient requires hoyer lift (Dependent) for safe transfers.    Time 12    Period Weeks    Status New    Target Date 12/17/20                   Plan - 10/20/20 1153     Clinical Impression Statement Patient was able to stand better today vs. Last week and able to maintain standing well today with no knee buckling. She was even able to take a small step with each leg to pivot over as previously unable. She was well motivated and described the activity as hard and achievable. Patient will benefit from continued PT services to increase mobility and strength to improve patient's quality of life    Personal  Factors and Comorbidities Age;Time since onset of injury/illness/exacerbation;Comorbidity 3+;Fitness    Comorbidities HTN, quadriparesis, history of DVT, neurogenic bladder    Examination-Activity Limitations Bathing;Hygiene/Grooming;Squat;Bed Mobility;Lift;Bend;Stand;Engineer, manufacturing;Toileting;Self Feeding;Transfers;Continence;Sit;Dressing;Sleep;Carry    Examination-Participation Restrictions Church;Laundry;Cleaning;Medication Management;Community Activity;Meal Prep;Interpersonal Relationship    Stability/Clinical Decision Making Evolving/Moderate complexity    Rehab Potential Fair    PT Frequency 2x / week    PT Duration 12 weeks    PT Treatment/Interventions ADLs/Self Care Home Management;Electrical Stimulation;Therapeutic activities;Wheelchair mobility training;Vasopneumatic Device;Joint Manipulations;Vestibular;Passive range of motion;Patient/family education;Therapeutic exercise;DME Instruction;Biofeedback;Aquatic Therapy;Moist Heat;Gait training;Balance training;Orthotic Fit/Training;Dry needling;Energy conservation;Taping;Splinting;Neuromuscular re-education;Cryotherapy;Ultrasound;Functional mobility training    PT Next Visit Plan Continue with progressive sanding/transfer training.    PT Home Exercise Plan no changes    Consulted and Agree with Plan of Care Patient             Patient will benefit from skilled therapeutic intervention in order to improve the following deficits and impairments:  Abnormal gait, Decreased balance, Decreased endurance, Decreased mobility, Difficulty walking, Hypomobility, Impaired sensation, Decreased range of motion, Improper body mechanics, Impaired perceived functional ability, Decreased activity tolerance, Decreased knowledge of use of DME, Decreased safety awareness, Decreased strength, Impaired flexibility, Impaired UE functional use, Postural dysfunction  Visit Diagnosis: Abnormality of gait and mobility  Muscle weakness  (generalized)  Other lack of coordination  Difficulty in walking, not elsewhere classified     Problem List Patient Active Problem List   Diagnosis Date Noted   Acute  appendicitis with appendiceal abscess 05/30/2018   Hypocalcemia 02/15/2018   Dysuria 02/15/2018   Bilateral lower extremity edema 02/11/2018   Vaginal yeast infection 01/30/2018   Chest tightness 01/27/2018   Healthcare-associated pneumonia 01/25/2018   Pleural effusion, not elsewhere classified 01/25/2018   Acute deep vein thrombosis (DVT) of left lower extremity ([place]) 01/24/2018   Acute deep vein thrombosis (DVT) of right lower extremity ([place]) 01/24/2018   Chronic allergic rhinitis 01/24/2018   Depression with anxiety 01/24/2018   UTI due to Klebsiella species 01/24/2018   Protein-calorie malnutrition, severe ([place]) 01/24/2018   S/P insertion of IVC (inferior vena caval) filter 01/24/2018   Reactive depression    Hypokalemia    Leukocytosis    Essential hypertension    Trauma    Tetraparesis ([facility])    Neuropathic pain    Neurogenic bowel    Neurogenic bladder    Benign essential HTN    Acute blood loss anemia    Central cord syndrome at C6 level of cervical spinal cord ([place]) 11/29/2017   Central cord syndrome ([place]) 11/29/2017    Lewis Moccasin, PT 10/20/2020, 2:26 PM  [place] [facility] SERVICES [place], [place], [address] Phone: [phone]   Fax:  [phone]  Name: Sherry [name] MRN: [id number] Date of Birth: [birth year]

## 2020-10-22 ENCOUNTER — Ambulatory Visit: Payer: Medicare HMO

## 2020-10-22 ENCOUNTER — Other Ambulatory Visit: Payer: Self-pay

## 2020-10-22 ENCOUNTER — Encounter: Payer: Self-pay | Admitting: Occupational Therapy

## 2020-10-22 ENCOUNTER — Ambulatory Visit: Payer: Medicare HMO | Admitting: Occupational Therapy

## 2020-10-22 DIAGNOSIS — R262 Difficulty in walking, not elsewhere classified: Secondary | ICD-10-CM

## 2020-10-22 DIAGNOSIS — M6281 Muscle weakness (generalized): Secondary | ICD-10-CM

## 2020-10-22 DIAGNOSIS — R269 Unspecified abnormalities of gait and mobility: Secondary | ICD-10-CM

## 2020-10-22 DIAGNOSIS — R278 Other lack of coordination: Secondary | ICD-10-CM

## 2020-10-22 DIAGNOSIS — R2681 Unsteadiness on feet: Secondary | ICD-10-CM

## 2020-10-22 DIAGNOSIS — R140 Abdominal distension (gaseous): Secondary | ICD-10-CM

## 2020-10-22 DIAGNOSIS — R2689 Other abnormalities of gait and mobility: Secondary | ICD-10-CM

## 2020-10-22 DIAGNOSIS — S14129S Central cord syndrome at unspecified level of cervical spinal cord, sequela: Secondary | ICD-10-CM

## 2020-10-22 NOTE — Therapy (Signed)
[place] [facility] [address] [place], [place], [address] Phone: [phone]   Fax:  [phone]  Occupational Therapy Treatment  Patient Details  Name: Sherry [name] MRN: [id number] Date of Birth: 03/26/1934 No data recorded  Encounter Date: 10/22/2020   OT End of Session - 10/22/20 1109     Visit Number [id number]    Number of Visits 93    Date for OT Re-Evaluation 01/12/21    Authorization Time Period Progress report period starting 09/08/2020    OT Start Time 1100    OT Stop Time 1145    OT Time Calculation (min) 45 min    Activity Tolerance Patient tolerated treatment well    Behavior During Therapy [facility] for tasks assessed/performed             Past Medical History:  Diagnosis Date   Acute blood loss anemia    Arthritis    Cancer ([place])    skin   Central cord syndrome at C6 level of cervical spinal cord ([place]) 11/29/2017   Hypertension    Protein-calorie malnutrition, severe ([place]) 01/24/2018   S/P insertion of IVC (inferior vena caval) filter 01/24/2018   Tetraparesis ([place])     Past Surgical History:  Procedure Laterality Date   ANTERIOR CERVICAL DECOMP/DISCECTOMY FUSION N/A 11/29/2017   Procedure: Cervical five-six, six-seven Anterior Cervical Decompression Fusion;  Surgeon: Judith Part, MD;  Location: [facility];  Service: Neurosurgery;  Laterality: N/A;  Cervical five-six, six-seven Anterior Cervical Decompression Fusion   CATARACT EXTRACTION     IR IVC FILTER PLMT / S&I /IMG GUID/MOD SED  12/08/2017    There were no vitals filed for this visit.   Subjective Assessment - 10/22/20 1108     Subjective  Patient reports that he is doing well today    Patient is accompanied by: Family member    Pertinent History Patient s/p fall November 29, 2017 resulting in diagnosis of central cord syndrome at C 6 level.  She has had therapy in multiple venues but with recent move to [facility], therapy staff recommended she  seek outpatient therapy for her needs.    Currently in Pain? No/denies            OT TREATMENT     Pt. tolerated bilateral shoulder flexion, extension, abduction, elbow flexion, extension forearm supination AAROM/AROM. PROM bilateral wrist extension, digit MP, PIP, and DIP flexion, and extension, thumb radial, and palmar abduction in conjunction with moist heat. Pt. Worked on BUE strengthening, and reciprocal motion using the UBE while sitting for 70mn. with minimal resistance. Forward, and reverse motion. Constant monitoring was provided. Pt. worked on reaching with abduction reaching out to the far left, and the far right using the  vertical shape tower. Patient worked reaching out for flat shapes, and placing them through 3 vertical dowels of progressively increasing heights. Pt. worked on Bilateral UE reaching, and ROM in order to improve functional reaching for ADL, IADL tasks, and haircare.   Pt. continues to make progress overall, and is improving with BUE reaching in preparation for improving overhead ADLs, and haircare. Pt. continues to tolerate ROM, moist heat modality, and functional reaching without pain. Pt. was able  to reach out to the far right to receive the shapes, changing hands at midline, and reaching to the far left to place them onto the 1st, 2nd, and 3rd vertical dowels of progressively increasing heights with both the right, and left UEs. Pt. required less  time to complete, and was able to reach further with no leaning noted today during reaching tasks. Pt. was able to tolerate the UBE for 8 min. With the UBE elevated for increased ROM. Pt. tolerated ROM well without reports of pain, or discomfort. Pt. continues to benefit from working on improving ROM in order to work towards increasing engagement of her bilateral hands during ADLs, and IADL tasks.                             OT Education - 10/22/20 1109     Education Details Bilateral upper extremity  range of motion with functional reaching to the far right and left    Person(s) Educated Patient;Caregiver(s)    Methods Explanation;Demonstration    Comprehension Verbalized understanding;Need further instruction                 OT Long Term Goals - 10/20/20 1302       OT LONG TERM GOAL #1   Title Patient and caregiver will demonstrate understanding of home exercise program for ROM.    Baseline Pt. continues to have a restorative aide rehab aide assist her wih exercises at [facility]. 8/8: Independently completes/directs HEP mulitple times/week    Time 12    Period Weeks    Status Partially Met    Target Date 01/12/21      OT LONG TERM GOAL #4   Title Patient will demonstrate improved composite finger flexion to hold deodorant to apply to underarms.    Baseline 10/20/2020: Pt. conitnues to make steady progress with bilateral digit flexion.Pt. conitnues to present with stiifness/tightness which is hindering pt.'s ability to formulate a full composite fist. Pt. continues to present with improving finger flexion of R hand for a partial gross grasp, but unable to grasp and hold items. pt. conitnues to present with limited digit flexion of L hand. Pt. has active left thumb abduction. 8/8: holds cup with 2 hands, continues to have limited composite finger flexion L worse than R    Time 12    Period Weeks    Status On-going    Target Date 01/12/21      OT LONG TERM GOAL #6   Title Patient will increase right UE ROM to comb the back of her hair with modified independence.    Baseline 10/20/2020: Pt. continues to progress wtith RRUE ROM, and is able to use a pic for the back of her head, however is unable to brush the back of her hair. 07/30/2020: Pt. is now able to comb the back of her hair with a pic, however continues to work on completing the task efficiently. 8/8: Achieves with great effort, has assistance for efficiancy    Time 12    Period Weeks    Status Partially Met    Target  Date 01/12/21      OT LONG TERM GOAL #7   Title Pt. will independently, and efficiently send text messages on her phone.    Baseline 10/20/2020: Pt. is now able to type a text message on the phone, however not efficiently taking increased time to complete. 07/30/2020: Pt. continues to present with limited BUE strength, and [facility] skills. 8/8: independtly drafts text messages, continues to work on accuracy and efficiancy 2/2 limited [facility]    Time 12    Period Weeks    Status Partially Met    Target Date 01/12/21      OT  LONG TERM GOAL #8   Title Pt. will write, and sign her name with 100% legibility, and modified independence.    Baseline 10/20/2020: Pt. continues to make progress overall. Pt. continues to consistently fill out her daily menu, and writing for crossword puzzles. Pt. is able to maintain grasp on a wide width pen. [place] to work on writing legibility. 8/8: writes daily, continues to work on improving speed    Time 12    Period Weeks    Status Partially Met    Target Date 01/12/21                   Plan - 10/22/20 1109     Clinical Impression Statement Pt. continues to make progress overall, and is improving with BUE reaching in preparation for improving overhead ADLs, and haircare. Pt. continues to tolerate ROM, moist heat modality, and functional reaching without pain. Pt. was able  to reach out to the far right to receive the shapes, changing hands at midline, and reaching to the far left to place them onto the 1st, 2nd, and 3rd vertical dowels of progressively increasing heights with both the right, and left UEs. Pt. required less time to complete, and was able to reach further with no leaning noted today during reaching tasks. Pt. was able to tolerate the UBE for 8 min. With the UBE elevated for increased ROM. Pt. tolerated ROM well without reports of pain, or discomfort. Pt. continues to benefit from working on improving ROM in order to work towards increasing engagement  of her bilateral hands during ADLs, and IADL tasks.    OT Occupational Profile and History Detailed Assessment- Review of Records and additional review of physical, cognitive, psychosocial history related to current functional performance    Occupational performance deficits (Please refer to evaluation for details): ADL's;IADL's;Leisure;Social Participation    Body Structure / Function / Physical Skills ADL;Continence;Dexterity;Flexibility;Strength;ROM;Balance;Coordination;FMC;IADL;Endurance;UE functional use;Decreased knowledge of use of DME;GMC    Psychosocial Skills Environmental  Adaptations;Habits;Routines and Behaviors    Rehab Potential Fair    Clinical Decision Making Several treatment options, min-mod task modification necessary    Comorbidities Affecting Occupational Performance: Presence of comorbidities impacting occupational performance    Comorbidities impacting occupational performance description: contractures of bilateral hands, dependent transfers,    Modification or Assistance to Complete Evaluation  No modification of tasks or assist necessary to complete eval    OT Frequency 2x / week    OT Duration 12 weeks    OT Treatment/Interventions Self-care/ADL training;Cryotherapy;Therapeutic exercise;DME and/or AE instruction;Balance training;Neuromuscular education;Manual Therapy;Splinting;Moist Heat;Passive range of motion;Therapeutic activities;Patient/family education    Plan continue to progress ROM of digits, wrists and shoulders as it pertains to completion of ADL tasks that pt values.    Consulted and Agree with Plan of Care Patient             Patient will benefit from skilled therapeutic intervention in order to improve the following deficits and impairments:   Body Structure / Function / Physical Skills: ADL, Continence, Dexterity, Flexibility, Strength, ROM, Balance, Coordination, FMC, IADL, Endurance, UE functional use, Decreased knowledge of use of DME, GMC    Psychosocial Skills: Environmental  Adaptations, Habits, Routines and Behaviors   Visit Diagnosis: Muscle weakness (generalized)  Other lack of coordination    Problem List Patient Active Problem List   Diagnosis Date Noted   Acute appendicitis with appendiceal abscess 05/30/2018   Hypocalcemia 02/15/2018   Dysuria 02/15/2018   Bilateral lower extremity edema 02/11/2018     Vaginal yeast infection 01/30/2018   Chest tightness 01/27/2018   Healthcare-associated pneumonia 01/25/2018   Pleural effusion, not elsewhere classified 01/25/2018   Acute deep vein thrombosis (DVT) of left lower extremity (HCC) 01/24/2018   Acute deep vein thrombosis (DVT) of right lower extremity (HCC) 01/24/2018   Chronic allergic rhinitis 01/24/2018   Depression with anxiety 01/24/2018   UTI due to Klebsiella species 01/24/2018   Protein-calorie malnutrition, severe (HCC) 01/24/2018   S/P insertion of IVC (inferior vena caval) filter 01/24/2018   Reactive depression    Hypokalemia    Leukocytosis    Essential hypertension    Trauma    Tetraparesis (HCC)    Neuropathic pain    Neurogenic bowel    Neurogenic bladder    Benign essential HTN    Acute blood loss anemia    Central cord syndrome at C6 level of cervical spinal cord ([facility]) 11/29/2017   Central cord syndrome (HCC) 11/29/2017    Elaine Jagentenfl, MS,OTR/L 10/22/2020, 12:14 PM  [facility] [address] [place], [place], [address] Phone: [phone]   Fax:  [phone]  Name: Sherry [name] MRN: [id number] Date of Birth: 07/31/1934  

## 2020-10-22 NOTE — Therapy (Signed)
[place] MAIN [facility] SERVICES [address] [place], [place], [address] Phone: [phone]   Fax:  [phone]  Physical Therapy Treatment  Patient Details  Name: Sherry [name] MRN: [id number] Date of Birth: September 28, 1934 No data recorded  Encounter Date: 10/22/2020   PT End of Session - 10/22/20 1247     Visit Number 95    Number of Visits 104    Date for PT Re-Evaluation 12/17/20    Authorization Type [employer] FOTO performed by PT on eval (7/20), score 12, Progress note on 03/10/2020; PN on 08/20/2020    Authorization Time Period Recert 04/08/3289- 10/18/6058; PN on 0/05/5995; Recert 7/41/4239- 53/20/2334; PN on 10/01/2020    PT Start Time 1151    PT Stop Time 1230    PT Time Calculation (min) 39 min    Equipment Utilized During Treatment Gait belt    Activity Tolerance Patient tolerated treatment well;Patient limited by fatigue;Patient limited by pain    Behavior During Therapy WFL for tasks assessed/performed             Past Medical History:  Diagnosis Date   Acute blood loss anemia    Arthritis    Cancer ([place])    skin   Central cord syndrome at C6 level of cervical spinal cord ([place]) 11/29/2017   Hypertension    Protein-calorie malnutrition, severe ([place]) 01/24/2018   S/P insertion of IVC (inferior vena caval) filter 01/24/2018   Tetraparesis ([place])     Past Surgical History:  Procedure Laterality Date   ANTERIOR CERVICAL DECOMP/DISCECTOMY FUSION N/A 11/29/2017   Procedure: Cervical five-six, six-seven Anterior Cervical Decompression Fusion;  Surgeon: Judith Part, MD;  Location: [place];  Service: Neurosurgery;  Laterality: N/A;  Cervical five-six, six-seven Anterior Cervical Decompression Fusion   CATARACT EXTRACTION     IR IVC FILTER PLMT / S&I /IMG GUID/MOD SED  12/08/2017    There were no vitals filed for this visit.   Subjective Assessment - 10/22/20 1244     Subjective Pt reports she is doign well today,  denies any significant medical or medication updates since prior visit.    Pertinent History Pt is an 86 yo female that fell in 2019, fractured vertebrae in her neck and in her low back per family. 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 After hospital stay, pt discharged to [facility] for ~1 month, experienced severe UTI as well as bilateral DVT (IVC filter placed). Discharged to [facility], stayed for about a year, and then moved to [facility] in April 2021. Was receiving PT, but per family facility reported that did not have adequate equipment to maximize PT for her. Pt until about 1 month ago was practicing sit to stand transfers with 1-2 people, and working on static standing. Has a brace for L foot due to PF. Pt currently needs assistance with all ADLs (able to assist with donning/doffing her shirt), bed baths, and needs a hoyer lift for transfers. Able to drive power wheelchair without assistance.    Currently in Pain? No/denies              INTERVENTION THIS DATE: -In power chair: P/ROM stretching to pt tolerance 2x30sec of the following: SKTC bilat (knee/hip flexion), Right FABER, Left FABER, Bilat ankle DF, bilat hip horizontal abdcuction (not as limited on left), no excessive hamstrings tightness seens  -posturing of WC to outside of //bar -FWD scoot to edge of chair -independent short sitting with feet on floor -  ModA STS from powerchair elevated surface, BUE pull forward on //bar, author/aide provide lift assist at pelvis, anterior weight shift assist at pelvis, lateral sway COM centralization at pelvis.  2x90sec (limited by progressive loss of knee joint position over 90 seconds to a concerning degree/level of pain 3xSTS, modA, setup as above *recovery intervals provided as needed due to pain and high exertion rates.      PT Education - 10/22/20 1245     Education Details Stance posturing for energy conservation    Person(s) Educated Patient;Caregiver(s)     Methods Explanation;Demonstration    Comprehension Verbalized understanding;Returned demonstration;Verbal cues required              PT Short Term Goals - 03/10/20 2211       PT SHORT TERM GOAL #1   Title The patient will perform initial HEP with minimum assistance in order to improve strength and function.    Baseline Patient demonstrating independence with initial HEP as of 03/10/2020 with no questions or difficulty.    Time 4    Period Weeks    Status Achieved    Target Date 02/11/20               PT Long Term Goals - 09/24/20 1427       PT LONG TERM GOAL #1   Title The patient will be compliant with finalized HEP with minimum assistance in preparation for self management and maintenance of condition.    Baseline 03/10/2020- Patient familiar with initial HEP and will keep goal active as HEP becomes more progressive including possible transfers and standing activities. 04/07/2020- Patient continues to report compliance with LE strengthening home exercises without questions or concerns. 04/21/2020- Patient able to verbalize and demonstrate good understanding of current HEP including some supine and seated LE strengthening exercises. Reviewed and patient performed 10 reps today with minimal cueing. Will keep goal active to progress HEP as appropriate. 05/28/2020- patient reports continues to perform LE strengthening as able and some help from caregiver as able. No changes at this time to home program as patient is limited to supine/seated therex. 07/02/2020- Patient continues to report compliance with current supine and seated LE strengtheing home program and states no questions or concerns regarding her plan.    Time 12    Period Weeks    Status Achieved      PT LONG TERM GOAL #2   Title The patient will demonstrate at least 10 point improvement on FOTO score indicating an improved ability to perform functional activities.    Baseline on eval (/720) score 12; 10/22/19: 12; 12/10/19:  18, 12/13: 18: 04/07/2020- Will assess next visit. 07/02/2020- Will obtain next visit. 09/24/2020= Will obtain next visit.    Time 12    Period Weeks    Status On-going    Target Date 12/17/20      PT LONG TERM GOAL #3   Title The patient will demonstrate lateral scooting  for assistance with transfers with minA to maximize independence.    Baseline 10/22/19: Pt requires modA+1 for lateral scooting; 12/10/19: modA+1, 12/13: modA-maxA, 12/27: modA-maxA, 03/10/2020- ModA-MaxA- increased verbal cues and visual demonstration. 04/07/2020- Continued Max assist to perform forward/lateral scoot. 04/21/2020- Patient was able to demo slight lateral scoot with CGA approx 12 in then fatigued requiring max assist- she is able to scoot forward by leaning without significant issues. 05/28/2020- While sitting in chair- patient able to perform forward and lateral scoot with VC/visual demo and increased time allowed- CGA  today but not consistent yet- will keep goal active at this time to continue to focus on strengthening and improving technique. 07/02/2020- Patient able to demonstrate minimal ability to laterally shift weigh on mat table requiring max assist yet is able to forward scoot out to edge of mat table with min Assist. 08/20/2020 - Patient was able to demonstrate minimal lateral scooting at edge of mat while holding feet still so they did not slip on tile surface. She continues to be able to demonstrate scoot forward and uses tilt option to recline to position safely back into seat of power wheelchair. 09/25/2020- Patient able to sit on mat and minimally scoot laterally but requires min/mod A for efficient scooting. Will end goal at this time due to plateau in progress with scooting over extended time.    Time 12    Period Weeks    Status Not Met    Target Date 12/17/20      PT LONG TERM GOAL #4   Title The patient will demonstrate a squat pivot transfer with minimum assistance to maximize independence and mobility.     Baseline 10/22/19: unable to safely attempt at this time; 12/10/19: unable to safely attempt at this time, 01/14/20: unable to safety attempt at this time. 03/10/2020- Patient able to perform Stand pivot transfer with Maximal assist. 04/07/2020- Patient continues to require Max assist with sit to stand- will assist SPT next visit. 04/21/2020- Patient presents with Max assist to perform Sit to stand Transfer- unable to pivot or move Left LE well without difficulty. 07/02/2020= Max assist with SPT and minimal ability to pivot. 7/202/2022-Patient able to stand with minimal assist this visit (as good as CGA last visit) and able to move right foot to pivot and performing with moderate assist today. 09/24/2020- Patient able to perform sit to stand with Min assist from lift chair. Patient has demo inconsistency and will keep goal active.    Time 12    Period Weeks    Status On-going    Target Date 12/17/20      PT LONG TERM GOAL #5   Title The patient will demonstrate sitting without UE support for 2-5 minutes at EOB to improve participation and maximize independence with ADLs.    Baseline 10/22/19: Pt able to sit unsupported for at least 2 minutes at edge of bed. 04/21/2020- Patient continues to demo good sitting at edge of mat > 5 miin without difficulty or back support.    Time 12    Period Weeks    Status Achieved      Additional Long Term Goals   Additional Long Term Goals Yes      PT LONG TERM GOAL #6   Title Patient will demonstrate improved functional LE strength as seen by consistent ability to stand > 2 min (3 out of 4 trials) with Mod/Max A for improved LE strength with transfers.    Baseline 05/28/2020- Patient able to inconsistent stand 1-2 min right now in //bars with Mod/Max A. 07/02/2020- Patient was able to progress last 2 visit to standing using bilateral platform attachment between 1-2 min which is a progression from the parallel bars. 08/20/2020- Patient performed static standing with min- mod A  using bilateral platform attachment on walker for 6 min 30 sec today with assist for trunk control and intermittent verbal cues for posture. 09/24/2020- Patient has consistently been able to stand >2 min in // bars and when using bilateral Platform walker.    Time 12  Period Weeks    Status Achieved      PT LONG TERM GOAL #7   Title Patient/caregiver will demonstrate assisted transfer using [place] Lift consistently Independently for improved ability to transfer at home from lift chair to bed.    Baseline 09/24/2020- Patient requires hoyer lift (Dependent) for safe transfers.    Time 12    Period Weeks    Status New    Target Date 12/17/20                   Plan - 10/22/20 1248     Clinical Impression Statement Continued focus on functional moblity technique, strengthening and tolerance. Trials STS transfers from outside // bars to trial BUE pull ergonomics which ended up being fairly successful, as well as standing platform to act as foot block and prevent anterior slide of Left foot in slick. Foot block is effective. Pt tolerates standing 2x90sec, then 3xSTS, all with MODA to rise, minA to remain up and steady. Rt knee instability remains painful and limiting, discussed potential utility of KAFO bracing in future, unclear if appropriate but will defer to primary therapist. Pt conitnues to make incremental success toward goals of treatment, remains focused and motivated.    Personal Factors and Comorbidities Age;Time since onset of injury/illness/exacerbation;Comorbidity 3+;Fitness    Comorbidities HTN, quadriparesis, history of DVT, neurogenic bladder    Examination-Activity Limitations Bathing;Hygiene/Grooming;Squat;Bed Mobility;Lift;Bend;Stand;Engineer, manufacturing;Toileting;Self Feeding;Transfers;Continence;Sit;Dressing;Sleep;Carry    Examination-Participation Restrictions Church;Laundry;Cleaning;Medication Management;Community Activity;Meal Prep;Interpersonal Relationship     Stability/Clinical Decision Making Evolving/Moderate complexity    Clinical Decision Making Moderate    Rehab Potential Fair    PT Frequency 2x / week    PT Duration 12 weeks    PT Treatment/Interventions ADLs/Self Care Home Management;Electrical Stimulation;Therapeutic activities;Wheelchair mobility training;Vasopneumatic Device;Joint Manipulations;Vestibular;Passive range of motion;Patient/family education;Therapeutic exercise;DME Instruction;Biofeedback;Aquatic Therapy;Moist Heat;Gait training;Balance training;Orthotic Fit/Training;Dry needling;Energy conservation;Taping;Splinting;Neuromuscular re-education;Cryotherapy;Ultrasound;Functional mobility training    PT Next Visit Plan Continue with progressive sanding/transfer training.    PT Home Exercise Plan no changes    Consulted and Agree with Plan of Care Patient             Patient will benefit from skilled therapeutic intervention in order to improve the following deficits and impairments:  Abnormal gait, Decreased balance, Decreased endurance, Decreased mobility, Difficulty walking, Hypomobility, Impaired sensation, Decreased range of motion, Improper body mechanics, Impaired perceived functional ability, Decreased activity tolerance, Decreased knowledge of use of DME, Decreased safety awareness, Decreased strength, Impaired flexibility, Impaired UE functional use, Postural dysfunction  Visit Diagnosis: Muscle weakness (generalized)  Other lack of coordination  Abnormality of gait and mobility  Difficulty in walking, not elsewhere classified  Unsteadiness on feet  Central cord syndrome, sequela ([facility])  Other abnormalities of gait and mobility  Abdominal distention     Problem List Patient Active Problem List   Diagnosis Date Noted   Acute appendicitis with appendiceal abscess 05/30/2018   Hypocalcemia 02/15/2018   Dysuria 02/15/2018   Bilateral lower extremity edema 02/11/2018   Vaginal yeast infection  01/30/2018   Chest tightness 01/27/2018   Healthcare-associated pneumonia 01/25/2018   Pleural effusion, not elsewhere classified 01/25/2018   Acute deep vein thrombosis (DVT) of left lower extremity ([place]) 01/24/2018   Acute deep vein thrombosis (DVT) of right lower extremity ([facility]) 01/24/2018   Chronic allergic rhinitis 01/24/2018   Depression with anxiety 01/24/2018   UTI due to Klebsiella species 01/24/2018   Protein-calorie malnutrition, severe ([place]) 01/24/2018   S/P insertion of IVC (inferior vena caval) filter 01/24/2018  Reactive depression    Hypokalemia    Leukocytosis    Essential hypertension    Trauma    Tetraparesis ([facility])    Neuropathic pain    Neurogenic bowel    Neurogenic bladder    Benign essential HTN    Acute blood loss anemia    Central cord syndrome at C6 level of cervical spinal cord ([place]) 11/29/2017   Central cord syndrome ([place]) 11/29/2017   12:59 PM, 10/22/20 Etta Grandchild, PT, DPT Physical Therapist - [facility]  Outpatient Physical Therapy- [place] [phone]     [place], PT 10/22/2020, 12:54 PM  [place] MAIN [facility] SERVICES [address] [place], [place], [address] Phone: [phone]   Fax:  [phone]  Name: Sherry [name] MRN: [id number] Date of Birth: [birth year]

## 2020-10-24 DIAGNOSIS — F39 Unspecified mood [affective] disorder: Secondary | ICD-10-CM | POA: Diagnosis not present

## 2020-10-24 DIAGNOSIS — N319 Neuromuscular dysfunction of bladder, unspecified: Secondary | ICD-10-CM

## 2020-10-24 DIAGNOSIS — I10 Essential (primary) hypertension: Secondary | ICD-10-CM | POA: Diagnosis not present

## 2020-10-24 DIAGNOSIS — I82409 Acute embolism and thrombosis of unspecified deep veins of unspecified lower extremity: Secondary | ICD-10-CM

## 2020-10-24 DIAGNOSIS — K592 Neurogenic bowel, not elsewhere classified: Secondary | ICD-10-CM

## 2020-10-24 DIAGNOSIS — M1990 Unspecified osteoarthritis, unspecified site: Secondary | ICD-10-CM | POA: Diagnosis not present

## 2020-10-24 DIAGNOSIS — K219 Gastro-esophageal reflux disease without esophagitis: Secondary | ICD-10-CM | POA: Diagnosis not present

## 2020-10-24 DIAGNOSIS — Z91018 Allergy to other foods: Secondary | ICD-10-CM

## 2020-10-24 DIAGNOSIS — G8220 Paraplegia, unspecified: Secondary | ICD-10-CM

## 2020-10-27 ENCOUNTER — Other Ambulatory Visit: Payer: Self-pay

## 2020-10-27 ENCOUNTER — Ambulatory Visit: Payer: Medicare HMO | Admitting: Occupational Therapy

## 2020-10-27 ENCOUNTER — Ambulatory Visit: Payer: Medicare HMO

## 2020-10-27 ENCOUNTER — Encounter: Payer: Self-pay | Admitting: Occupational Therapy

## 2020-10-27 DIAGNOSIS — R278 Other lack of coordination: Secondary | ICD-10-CM

## 2020-10-27 DIAGNOSIS — R269 Unspecified abnormalities of gait and mobility: Secondary | ICD-10-CM

## 2020-10-27 DIAGNOSIS — M6281 Muscle weakness (generalized): Secondary | ICD-10-CM | POA: Diagnosis not present

## 2020-10-27 DIAGNOSIS — R2681 Unsteadiness on feet: Secondary | ICD-10-CM

## 2020-10-27 DIAGNOSIS — R262 Difficulty in walking, not elsewhere classified: Secondary | ICD-10-CM

## 2020-10-27 NOTE — Therapy (Signed)
[place] MAIN [facility] SERVICES [address] [place], [place], [address] Phone: [phone]   Fax:  [phone]  Physical Therapy Treatment  Patient Details  Name: Sherry [name] MRN: [id number] Date of Birth: [birth year] No data recorded  Encounter Date: 10/27/2020   PT End of Session - 10/27/20 1155     Visit Number 96    Number of Visits 104    Date for PT Re-Evaluation 12/17/20    Authorization Type [employer] medicare FOTO performed by PT on eval (7/20), score 12, Progress note on 03/10/2020; PN on 08/20/2020    Authorization Time Period Recert 06/05/4918- 1/00/7121; PN on 10/08/5881; Recert 2/54/9826- 41/58/3094; PN on 10/01/2020    PT Start Time 1145    PT Stop Time 1225    PT Time Calculation (min) 40 min    Equipment Utilized During Treatment Gait belt    Activity Tolerance Patient tolerated treatment well;Patient limited by fatigue;Patient limited by pain    Behavior During Therapy WFL for tasks assessed/performed             Past Medical History:  Diagnosis Date   Acute blood loss anemia    Arthritis    Cancer ([place])    skin   Central cord syndrome at C6 level of cervical spinal cord ([place]) 11/29/2017   Hypertension    Protein-calorie malnutrition, severe ([place]) 01/24/2018   S/P insertion of IVC (inferior vena caval) filter 01/24/2018   Tetraparesis ([place])     Past Surgical History:  Procedure Laterality Date   ANTERIOR CERVICAL DECOMP/DISCECTOMY FUSION N/A 11/29/2017   Procedure: Cervical five-six, six-seven Anterior Cervical Decompression Fusion;  Surgeon: Judith Part, MD;  Location: [facility];  Service: Neurosurgery;  Laterality: N/A;  Cervical five-six, six-seven Anterior Cervical Decompression Fusion   CATARACT EXTRACTION     IR IVC FILTER PLMT / S&I /IMG GUID/MOD SED  12/08/2017    There were no vitals filed for this visit.   Subjective Assessment - 10/27/20 1151     Subjective Patient reports she received her COVID  booster shot last Friday with some initial soreness.    Pertinent History Pt is an 86 yo female that fell in 2019, fractured vertebrae in her neck and in her low back per family. 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 After hospital stay, pt discharged to [facility] for ~1 month, experienced severe UTI as well as bilateral DVT (IVC filter placed). Discharged to [facility], stayed for about a year, and then moved to [facility] in April 2021. Was receiving PT, but per family facility reported that did not have adequate equipment to maximize PT for her. Pt until about 1 month ago was practicing sit to stand transfers with 1-2 people, and working on static standing. Has a brace for L foot due to PF. Pt currently needs assistance with all ADLs (able to assist with donning/doffing her shirt), bed baths, and needs a hoyer lift for transfers. Able to drive power wheelchair without assistance.    Currently in Pain? No/denies               Patient performed 5 trials of static standing in // bars. Mod Assist to stand today.   1) 5 min 13 sec with patient able to quickly attain a good center of mass and contract glutes/hip ext well and hold in place- No need to block either knee today . 2) 74mn 21 sec - min assist with trunk control 3) 2 min  29 sec- Improved overall ability to maintain posture with good hip ext  4) 2 min 47 sec sec-  5) 2 min 2 sec- Patient was standing well but requested to stop due to fatigue.   Clinical Impression: Patient presents today with good motivation and very responsive to VC with standing posture. She was able to stand with mod assist and maintain standing with  min assist today. She was able to center her balance engage her glutes better today. Patient will benefit from continued PT services to increase mobility and strength to improve patient's quality of life.                             PT Education - 10/27/20 1154     Education  Details Standing postural Ed    Person(s) Educated Patient    Methods Explanation;Demonstration;Tactile cues;Verbal cues    Comprehension Verbalized understanding;Returned demonstration;Verbal cues required;Tactile cues required;Need further instruction              PT Short Term Goals - 03/10/20 2211       PT SHORT TERM GOAL #1   Title The patient will perform initial HEP with minimum assistance in order to improve strength and function.    Baseline Patient demonstrating independence with initial HEP as of 03/10/2020 with no questions or difficulty.    Time 4    Period Weeks    Status Achieved    Target Date 02/11/20               PT Long Term Goals - 09/24/20 1427       PT LONG TERM GOAL #1   Title The patient will be compliant with finalized HEP with minimum assistance in preparation for self management and maintenance of condition.    Baseline 03/10/2020- Patient familiar with initial HEP and will keep goal active as HEP becomes more progressive including possible transfers and standing activities. 04/07/2020- Patient continues to report compliance with LE strengthening home exercises without questions or concerns. 04/21/2020- Patient able to verbalize and demonstrate good understanding of current HEP including some supine and seated LE strengthening exercises. Reviewed and patient performed 10 reps today with minimal cueing. Will keep goal active to progress HEP as appropriate. 05/28/2020- patient reports continues to perform LE strengthening as able and some help from caregiver as able. No changes at this time to home program as patient is limited to supine/seated therex. 07/02/2020- Patient continues to report compliance with current supine and seated LE strengtheing home program and states no questions or concerns regarding her plan.    Time 12    Period Weeks    Status Achieved      PT LONG TERM GOAL #2   Title The patient will demonstrate at least 10 point improvement on  FOTO score indicating an improved ability to perform functional activities.    Baseline on eval (/720) score 12; 10/22/19: 12; 12/10/19: 18, 12/13: 18: 04/07/2020- Will assess next visit. 07/02/2020- Will obtain next visit. 09/24/2020= Will obtain next visit.    Time 12    Period Weeks    Status On-going    Target Date 12/17/20      PT LONG TERM GOAL #3   Title The patient will demonstrate lateral scooting  for assistance with transfers with minA to maximize independence.    Baseline 10/22/19: Pt requires modA+1 for lateral scooting; 12/10/19: modA+1, 12/13: modA-maxA, 12/27: modA-maxA, 03/10/2020- ModA-MaxA- increased verbal cues  and visual demonstration. 04/07/2020- Continued Max assist to perform forward/lateral scoot. 04/21/2020- Patient was able to demo slight lateral scoot with CGA approx 12 in then fatigued requiring max assist- she is able to scoot forward by leaning without significant issues. 05/28/2020- While sitting in chair- patient able to perform forward and lateral scoot with VC/visual demo and increased time allowed- CGA today but not consistent yet- will keep goal active at this time to continue to focus on strengthening and improving technique. 07/02/2020- Patient able to demonstrate minimal ability to laterally shift weigh on mat table requiring max assist yet is able to forward scoot out to edge of mat table with min Assist. 08/20/2020 - Patient was able to demonstrate minimal lateral scooting at edge of mat while holding feet still so they did not slip on tile surface. She continues to be able to demonstrate scoot forward and uses tilt option to recline to position safely back into seat of power wheelchair. 09/25/2020- Patient able to sit on mat and minimally scoot laterally but requires min/mod A for efficient scooting. Will end goal at this time due to plateau in progress with scooting over extended time.    Time 12    Period Weeks    Status Not Met    Target Date 12/17/20      PT LONG TERM  GOAL #4   Title The patient will demonstrate a squat pivot transfer with minimum assistance to maximize independence and mobility.    Baseline 10/22/19: unable to safely attempt at this time; 12/10/19: unable to safely attempt at this time, 01/14/20: unable to safety attempt at this time. 03/10/2020- Patient able to perform Stand pivot transfer with Maximal assist. 04/07/2020- Patient continues to require Max assist with sit to stand- will assist SPT next visit. 04/21/2020- Patient presents with Max assist to perform Sit to stand Transfer- unable to pivot or move Left LE well without difficulty. 07/02/2020= Max assist with SPT and minimal ability to pivot. 7/202/2022-Patient able to stand with minimal assist this visit (as good as CGA last visit) and able to move right foot to pivot and performing with moderate assist today. 09/24/2020- Patient able to perform sit to stand with Min assist from lift chair. Patient has demo inconsistency and will keep goal active.    Time 12    Period Weeks    Status On-going    Target Date 12/17/20      PT LONG TERM GOAL #5   Title The patient will demonstrate sitting without UE support for 2-5 minutes at EOB to improve participation and maximize independence with ADLs.    Baseline 10/22/19: Pt able to sit unsupported for at least 2 minutes at edge of bed. 04/21/2020- Patient continues to demo good sitting at edge of mat > 5 miin without difficulty or back support.    Time 12    Period Weeks    Status Achieved      Additional Long Term Goals   Additional Long Term Goals Yes      PT LONG TERM GOAL #6   Title Patient will demonstrate improved functional LE strength as seen by consistent ability to stand > 2 min (3 out of 4 trials) with Mod/Max A for improved LE strength with transfers.    Baseline 05/28/2020- Patient able to inconsistent stand 1-2 min right now in //bars with Mod/Max A. 07/02/2020- Patient was able to progress last 2 visit to standing using bilateral platform  attachment between 1-2 min which is a progression  from the parallel bars. 08/20/2020- Patient performed static standing with min- mod A using bilateral platform attachment on walker for 6 min 30 sec today with assist for trunk control and intermittent verbal cues for posture. 09/24/2020- Patient has consistently been able to stand >2 min in // bars and when using bilateral Platform walker.    Time 12    Period Weeks    Status Achieved      PT LONG TERM GOAL #7   Title Patient/caregiver will demonstrate assisted transfer using [place] consistently Independently for improved ability to transfer at home from lift chair to bed.    Baseline 09/24/2020- Patient requires hoyer lift (Dependent) for safe transfers.    Time 12    Period Weeks    Status New    Target Date 12/17/20                   Plan - 10/27/20 1155     Clinical Impression Statement Patient presents today with good motivation and very responsive to VC with standing posture. She was able to stand with mod assist and maintain standing with  min assist today. She was able to center her balance engage her glutes better today. Patient will benefit from continued PT services to increase mobility and strength to improve patient's quality of life.    Personal Factors and Comorbidities Age;Time since onset of injury/illness/exacerbation;Comorbidity 3+;Fitness    Comorbidities HTN, quadriparesis, history of DVT, neurogenic bladder    Examination-Activity Limitations Bathing;Hygiene/Grooming;Squat;Bed Mobility;Lift;Bend;Stand;Engineer, manufacturing;Toileting;Self Feeding;Transfers;Continence;Sit;Dressing;Sleep;Carry    Examination-Participation Restrictions Church;Laundry;Cleaning;Medication Management;Community Activity;Meal Prep;Interpersonal Relationship    Stability/Clinical Decision Making Evolving/Moderate complexity    Rehab Potential Fair    PT Frequency 2x / week    PT Duration 12 weeks    PT  Treatment/Interventions ADLs/Self Care Home Management;Electrical Stimulation;Therapeutic activities;Wheelchair mobility training;Vasopneumatic Device;Joint Manipulations;Vestibular;Passive range of motion;Patient/family education;Therapeutic exercise;DME Instruction;Biofeedback;Aquatic Therapy;Moist Heat;Gait training;Balance training;Orthotic Fit/Training;Dry needling;Energy conservation;Taping;Splinting;Neuromuscular re-education;Cryotherapy;Ultrasound;Functional mobility training    PT Next Visit Plan Continue with progressive sanding/transfer training.    PT Home Exercise Plan no changes    Consulted and Agree with Plan of Care Patient             Patient will benefit from skilled therapeutic intervention in order to improve the following deficits and impairments:  Abnormal gait, Decreased balance, Decreased endurance, Decreased mobility, Difficulty walking, Hypomobility, Impaired sensation, Decreased range of motion, Improper body mechanics, Impaired perceived functional ability, Decreased activity tolerance, Decreased knowledge of use of DME, Decreased safety awareness, Decreased strength, Impaired flexibility, Impaired UE functional use, Postural dysfunction  Visit Diagnosis: Abnormality of gait and mobility  Difficulty in walking, not elsewhere classified  Muscle weakness (generalized)  Unsteadiness on feet     Problem List Patient Active Problem List   Diagnosis Date Noted   Acute appendicitis with appendiceal abscess 05/30/2018   Hypocalcemia 02/15/2018   Dysuria 02/15/2018   Bilateral lower extremity edema 02/11/2018   Vaginal yeast infection 01/30/2018   Chest tightness 01/27/2018   Healthcare-associated pneumonia 01/25/2018   Pleural effusion, not elsewhere classified 01/25/2018   Acute deep vein thrombosis (DVT) of left lower extremity ([place]) 01/24/2018   Acute deep vein thrombosis (DVT) of right lower extremity ([place]) 01/24/2018   Chronic allergic rhinitis  01/24/2018   Depression with anxiety 01/24/2018   UTI due to Klebsiella species 01/24/2018   Protein-calorie malnutrition, severe ([place]) 01/24/2018   S/P insertion of IVC (inferior vena caval) filter 01/24/2018   Reactive depression    Hypokalemia  Leukocytosis    Essential hypertension    Trauma    Tetraparesis ([facility])    Neuropathic pain    Neurogenic bowel    Neurogenic bladder    Benign essential HTN    Acute blood loss anemia    Central cord syndrome at C6 level of cervical spinal cord ([place]) 11/29/2017   Central cord syndrome ([place]) 11/29/2017    Lewis Moccasin, PT 10/28/2020, 3:41 PM  [place] MAIN [facility] SERVICES [address] [place], [place], [address] Phone: [phone]   Fax:  [phone]  Name: ONEIKA SIMONIAN MRN: [id number] Date of Birth: 1934-10-07

## 2020-10-27 NOTE — Therapy (Signed)
[place] [facility] SERVICES [address] [place], [place], [address] Phone: [phone]   Fax:  [phone]  Occupational Therapy Treatment  Patient Details  Name: Sherry [name] MRN: [id number] Date of Birth: [birth year] No data recorded  Encounter Date: 10/27/2020   OT End of Session - 10/27/20 1106     Visit Number 72    Number of Visits 96    Date for OT Re-Evaluation 01/12/21    Authorization Time Period Progress report period starting 09/08/2020    OT Start Time 1100    OT Stop Time 1140    OT Time Calculation (min) 40 min    Equipment Utilized During Treatment moist heat    Activity Tolerance Patient tolerated treatment well    Behavior During Therapy WFL for tasks assessed/performed             Past Medical History:  Diagnosis Date   Acute blood loss anemia    Arthritis    Cancer ([place])    skin   Central cord syndrome at C6 level of cervical spinal cord ([place]) 11/29/2017   Hypertension    Protein-calorie malnutrition, severe ([place]) 01/24/2018   S/P insertion of IVC (inferior vena caval) filter 01/24/2018   Tetraparesis ([place])     Past Surgical History:  Procedure Laterality Date   ANTERIOR CERVICAL DECOMP/DISCECTOMY FUSION N/A 11/29/2017   Procedure: Cervical five-six, six-seven Anterior Cervical Decompression Fusion;  Surgeon: Judith Part, MD;  Location: [place];  Service: Neurosurgery;  Laterality: N/A;  Cervical five-six, six-seven Anterior Cervical Decompression Fusion   CATARACT EXTRACTION     IR IVC FILTER PLMT / S&I /IMG GUID/MOD SED  12/08/2017    There were no vitals filed for this visit.   Subjective Assessment - 10/27/20 1105     Subjective  Patient reports that he is doing well today    Patient is accompanied by: Family member    Pertinent History Patient s/p fall November 29, 2017 resulting in diagnosis of central cord syndrome at C 6 level.  She has had therapy in multiple venues but with recent  move to [facility], therapy staff recommended she seek outpatient therapy for her needs.    Patient Stated Goals Patient would like to be able to move better in bed, stand and perform self care tasks.    Currently in Pain? No/denies            OT TREATMENT     Pt. tolerated bilateral shoulder flexion, extension, abduction, elbow flexion, extension forearm supination AAROM/AROM. PROM bilateral wrist extension, digit MP, PIP, and DIP flexion, and extension, thumb radial, and palmar abduction in conjunction with moist heat. Pt. Worked on BUE strengthening, and reciprocal motion using the UBE while sitting for 73mn. with minimal resistance. Forward, and reverse motion. Constant monitoring was provided.    Pt. continues to make progress overall, and is improving with BUE reaching in preparation for improving overhead ADLs, and haircare. Pt. continues to tolerate ROM, moist heat modality, and functional reaching without pain. Pt. required less time to complete, and was able to reach further with no leaning noted today during reaching tasks. Pt. was able to tolerate the UBE for 7 min. Today With the UBE elevated for increased ROM. Pt. tolerated ROM well without reports of pain, or discomfort. Pt. continues to benefit from working on improving ROM in order to work towards increasing engagement of her bilateral hands during ADLs, and IADL tasks.  OT Education - 10/27/20 1106     Education Details Bilateral upper extremity range of motion with functional reaching to the far right and left    Person(s) Educated Patient;Caregiver(s)    Methods Explanation;Demonstration    Comprehension Verbalized understanding;Need further instruction                 OT Long Term Goals - 10/20/20 1302       OT LONG TERM GOAL #1   Title Patient and caregiver will demonstrate understanding of home exercise program for ROM.    Baseline Pt. continues to have a  restorative aide rehab aide assist her wih exercises at [facility]. 8/8: Independently completes/directs HEP mulitple times/week    Time 12    Period Weeks    Status Partially Met    Target Date 01/12/21      OT LONG TERM GOAL #4   Title Patient will demonstrate improved composite finger flexion to hold deodorant to apply to underarms.    Baseline 10/20/2020: Pt. conitnues to make steady progress with bilateral digit flexion.Pt. conitnues to present with stiifness/tightness which is hindering pt.'s ability to formulate a full composite fist. Pt. continues to present with improving finger flexion of R hand for a partial gross grasp, but unable to grasp and hold items. pt. conitnues to present with limited digit flexion of L hand. Pt. has active left thumb abduction. 8/8: holds cup with 2 hands, continues to have limited composite finger flexion L worse than R    Time 12    Period Weeks    Status On-going    Target Date 01/12/21      OT LONG TERM GOAL #6   Title Patient will increase right UE ROM to comb the back of her hair with modified independence.    Baseline 10/20/2020: Pt. continues to progress wtith RRUE ROM, and is able to use a pic for the back of her head, however is unable to brush the back of her hair. 07/30/2020: Pt. is now able to comb the back of her hair with a pic, however continues to work on completing the task efficiently. 8/8: Achieves with great effort, has assistance for efficiancy    Time 12    Period Weeks    Status Partially Met    Target Date 01/12/21      OT LONG TERM GOAL #7   Title Pt. will independently, and efficiently send text messages on her phone.    Baseline 10/20/2020: Pt. is now able to type a text message on the phone, however not efficiently taking increased time to complete. 07/30/2020: Pt. continues to present with limited BUE strength, and [facility] skills. 8/8: independtly drafts text messages, continues to work on accuracy and efficiancy 2/2 limited [facility]     Time 12    Period Weeks    Status Partially Met    Target Date 01/12/21      OT LONG TERM GOAL #8   Title Pt. will write, and sign her name with 100% legibility, and modified independence.    Baseline 10/20/2020: Pt. continues to make progress overall. Pt. continues to consistently fill out her daily menu, and writing for crossword puzzles. Pt. is able to maintain grasp on a wide width pen. [place] to work on Media planner. 8/8: writes daily, continues to work on improving speed    Time 12    Period Weeks    Status Partially Met    Target Date 01/12/21  Plan - 10/27/20 1107     Clinical Impression Statement Pt. continues to make progress overall, and is improving with BUE reaching in preparation for improving overhead ADLs, and haircare. Pt. continues to tolerate ROM, moist heat modality, and functional reaching without pain. Pt. required less time to complete, and was able to reach further with no leaning noted today during reaching tasks. Pt. was able to tolerate the UBE for 7 min. Today With the UBE elevated for increased ROM. Pt. tolerated ROM well without reports of pain, or discomfort. Pt. continues to benefit from working on improving ROM in order to work towards increasing engagement of her bilateral hands during ADLs, and IADL tasks.    OT Occupational Profile and History Detailed Assessment- Review of Records and additional review of physical, cognitive, psychosocial history related to current functional performance    Occupational performance deficits (Please refer to evaluation for details): ADL's;IADL's;Leisure;Social Participation    Body Structure / Function / Physical Skills ADL;Continence;Dexterity;Flexibility;Strength;ROM;Balance;Coordination;FMC;IADL;Endurance;UE functional use;Decreased knowledge of use of DME;GMC    Psychosocial Skills Environmental  Adaptations;Habits;Routines and Behaviors    Rehab Potential Fair    Clinical Decision  Making Several treatment options, min-mod task modification necessary    Comorbidities Affecting Occupational Performance: Presence of comorbidities impacting occupational performance    Comorbidities impacting occupational performance description: contractures of bilateral hands, dependent transfers,    Modification or Assistance to Complete Evaluation  No modification of tasks or assist necessary to complete eval    OT Frequency 2x / week    OT Duration 12 weeks    OT Treatment/Interventions Self-care/ADL training;Cryotherapy;Therapeutic exercise;DME and/or AE instruction;Balance training;Neuromuscular education;Manual Therapy;Splinting;Moist Heat;Passive range of motion;Therapeutic activities;Patient/family education    Consulted and Agree with Plan of Care Patient             Patient will benefit from skilled therapeutic intervention in order to improve the following deficits and impairments:   Body Structure / Function / Physical Skills: ADL, Continence, Dexterity, Flexibility, Strength, ROM, Balance, Coordination, FMC, IADL, Endurance, UE functional use, Decreased knowledge of use of DME, GMC   Psychosocial Skills: Environmental  Adaptations, Habits, Routines and Behaviors   Visit Diagnosis: Muscle weakness (generalized)  Other lack of coordination    Problem List Patient Active Problem List   Diagnosis Date Noted   Acute appendicitis with appendiceal abscess 05/30/2018   Hypocalcemia 02/15/2018   Dysuria 02/15/2018   Bilateral lower extremity edema 02/11/2018   Vaginal yeast infection 01/30/2018   Chest tightness 01/27/2018   Healthcare-associated pneumonia 01/25/2018   Pleural effusion, not elsewhere classified 01/25/2018   Acute deep vein thrombosis (DVT) of left lower extremity ([place]) 01/24/2018   Acute deep vein thrombosis (DVT) of right lower extremity ([place]) 01/24/2018   Chronic allergic rhinitis 01/24/2018   Depression with anxiety 01/24/2018   UTI due to  Klebsiella species 01/24/2018   Protein-calorie malnutrition, severe ([place]) 01/24/2018   S/P insertion of IVC (inferior vena caval) filter 01/24/2018   Reactive depression    Hypokalemia    Leukocytosis    Essential hypertension    Trauma    Tetraparesis ([facility])    Neuropathic pain    Neurogenic bowel    Neurogenic bladder    Benign essential HTN    Acute blood loss anemia    Central cord syndrome at C6 level of cervical spinal cord ([place]) 11/29/2017   Central cord syndrome ([place]) 11/29/2017    Harrel Carina, MS, OTR/L 10/27/2020, 11:52 AM  [place] [facility] [place], [place], [address] Phone: [phone]   Fax:  [phone]  Name: CARYN GIENGER MRN: [id number] Date of Birth: 07/01/1934

## 2020-10-29 ENCOUNTER — Other Ambulatory Visit: Payer: Self-pay

## 2020-10-29 ENCOUNTER — Ambulatory Visit: Payer: Medicare HMO

## 2020-10-29 ENCOUNTER — Ambulatory Visit: Payer: Medicare HMO | Admitting: Occupational Therapy

## 2020-10-29 DIAGNOSIS — M6281 Muscle weakness (generalized): Secondary | ICD-10-CM | POA: Diagnosis not present

## 2020-10-29 DIAGNOSIS — R2681 Unsteadiness on feet: Secondary | ICD-10-CM

## 2020-10-29 DIAGNOSIS — R269 Unspecified abnormalities of gait and mobility: Secondary | ICD-10-CM

## 2020-10-29 DIAGNOSIS — R262 Difficulty in walking, not elsewhere classified: Secondary | ICD-10-CM

## 2020-10-29 NOTE — Therapy (Signed)
[place] MAIN [facility] SERVICES [address] [place], [place], [address] Phone: [phone]   Fax:  [phone]  Physical Therapy Treatment  Patient Details  Name: Sherry [name] MRN: [id number] Date of Birth: 05-02-1934 No data recorded  Encounter Date: 10/29/2020   PT End of Session - 10/29/20 1204     Visit Number 97    Number of Visits 104    Date for PT Re-Evaluation 12/17/20    Authorization Type [employer] FOTO performed by PT on eval (7/20), score 12, Progress note on 03/10/2020; PN on 08/20/2020    Authorization Time Period Recert 0/09/6759- 9/50/9326; PN on 08/01/2456; Recert 0/99/8338- 25/06/3974; PN on 10/01/2020    PT Start Time 1145    PT Stop Time 1227    PT Time Calculation (min) 42 min    Equipment Utilized During Treatment Gait belt    Activity Tolerance Patient tolerated treatment well;Patient limited by fatigue;Patient limited by pain    Behavior During Therapy WFL for tasks assessed/performed             Past Medical History:  Diagnosis Date   Acute blood loss anemia    Arthritis    Cancer ([place])    skin   Central cord syndrome at C6 level of cervical spinal cord ([place]) 11/29/2017   Hypertension    Protein-calorie malnutrition, severe ([place]) 01/24/2018   S/P insertion of IVC (inferior vena caval) filter 01/24/2018   Tetraparesis ([place])     Past Surgical History:  Procedure Laterality Date   ANTERIOR CERVICAL DECOMP/DISCECTOMY FUSION N/A 11/29/2017   Procedure: Cervical five-six, six-seven Anterior Cervical Decompression Fusion;  Surgeon: Judith Part, MD;  Location: [facility];  Service: Neurosurgery;  Laterality: N/A;  Cervical five-six, six-seven Anterior Cervical Decompression Fusion   CATARACT EXTRACTION     IR IVC FILTER PLMT / S&I /IMG GUID/MOD SED  12/08/2017    There were no vitals filed for this visit.   Subjective Assessment - 10/29/20 1151     Subjective Patient reports no significant issues  today. Reports no pain.    Pertinent History Pt is an 86 yo female that fell in 2019, fractured vertebrae in her neck and in her low back per family. 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 After hospital stay, pt discharged to [facility] for ~1 month, experienced severe UTI as well as bilateral DVT (IVC filter placed). Discharged to [facility], stayed for about a year, and then moved to [facility] in April 2021. Was receiving PT, but per family facility reported that did not have adequate equipment to maximize PT for her. Pt until about 1 month ago was practicing sit to stand transfers with 1-2 people, and working on static standing. Has a brace for L foot due to PF. Pt currently needs assistance with all ADLs (able to assist with donning/doffing her shirt), bed baths, and needs a hoyer lift for transfers. Able to drive power wheelchair without assistance.    Currently in Pain? No/denies             Interventions:   Therapeutic Exercises:    PWC reclined - SL leg press with moderate to heavy manual resistance, x15 each LE- Decreased Right quad control vs. Left but able to tolerate mod to heavy resistance.  Seated LE - Hip march- AAROM 2 x 15 reps each Seated LE - Knee ext w/ 3 second hold - AROM on left and AAROM on right to achieve full extension,  2 x 12 reps. Seated hip add squeeze on soft red ball with 5 sec hold, 2 x 15 reps.  Gluteal squeeze with 5 sec hold x10 reps.  Attempted bridge in reclined position- patient able to engage glutes but unable to lift LE off chair.  AAROM B hip abduction 2 sets of 15 reps.     Clinical Impression: Patient able to respond well to progressive LE strengthening in chair today- able to tolerate heavy resistance in leg press and required decreased overall assist with Right LE today.  Patient will continue to benefit from continued PT services to increase mobility and strength to improve patient's quality of  life                        PT Education - 10/29/20 1204     Education Details Exercise technique with LE strengthening    Person(s) Educated Patient    Methods Explanation;Demonstration;Tactile cues;Verbal cues    Comprehension Verbalized understanding;Returned demonstration;Verbal cues required;Need further instruction              PT Short Term Goals - 03/10/20 2211       PT SHORT TERM GOAL #1   Title The patient will perform initial HEP with minimum assistance in order to improve strength and function.    Baseline Patient demonstrating independence with initial HEP as of 03/10/2020 with no questions or difficulty.    Time 4    Period Weeks    Status Achieved    Target Date 02/11/20               PT Long Term Goals - 09/24/20 1427       PT LONG TERM GOAL #1   Title The patient will be compliant with finalized HEP with minimum assistance in preparation for self management and maintenance of condition.    Baseline 03/10/2020- Patient familiar with initial HEP and will keep goal active as HEP becomes more progressive including possible transfers and standing activities. 04/07/2020- Patient continues to report compliance with LE strengthening home exercises without questions or concerns. 04/21/2020- Patient able to verbalize and demonstrate good understanding of current HEP including some supine and seated LE strengthening exercises. Reviewed and patient performed 10 reps today with minimal cueing. Will keep goal active to progress HEP as appropriate. 05/28/2020- patient reports continues to perform LE strengthening as able and some help from caregiver as able. No changes at this time to home program as patient is limited to supine/seated therex. 07/02/2020- Patient continues to report compliance with current supine and seated LE strengtheing home program and states no questions or concerns regarding her plan.    Time 12    Period Weeks    Status Achieved       PT LONG TERM GOAL #2   Title The patient will demonstrate at least 10 point improvement on FOTO score indicating an improved ability to perform functional activities.    Baseline on eval (/720) score 12; 10/22/19: 12; 12/10/19: 18, 12/13: 18: 04/07/2020- Will assess next visit. 07/02/2020- Will obtain next visit. 09/24/2020= Will obtain next visit.    Time 12    Period Weeks    Status On-going    Target Date 12/17/20      PT LONG TERM GOAL #3   Title The patient will demonstrate lateral scooting  for assistance with transfers with minA to maximize independence.    Baseline 10/22/19: Pt requires modA+1 for lateral scooting; 12/10/19: modA+1, 12/13:  modA-maxA, 12/27: modA-maxA, 03/10/2020- ModA-MaxA- increased verbal cues and visual demonstration. 04/07/2020- Continued Max assist to perform forward/lateral scoot. 04/21/2020- Patient was able to demo slight lateral scoot with CGA approx 12 in then fatigued requiring max assist- she is able to scoot forward by leaning without significant issues. 05/28/2020- While sitting in chair- patient able to perform forward and lateral scoot with VC/visual demo and increased time allowed- CGA today but not consistent yet- will keep goal active at this time to continue to focus on strengthening and improving technique. 07/02/2020- Patient able to demonstrate minimal ability to laterally shift weigh on mat table requiring max assist yet is able to forward scoot out to edge of mat table with min Assist. 08/20/2020 - Patient was able to demonstrate minimal lateral scooting at edge of mat while holding feet still so they did not slip on tile surface. She continues to be able to demonstrate scoot forward and uses tilt option to recline to position safely back into seat of power wheelchair. 09/25/2020- Patient able to sit on mat and minimally scoot laterally but requires min/mod A for efficient scooting. Will end goal at this time due to plateau in progress with scooting over extended time.     Time 12    Period Weeks    Status Not Met    Target Date 12/17/20      PT LONG TERM GOAL #4   Title The patient will demonstrate a squat pivot transfer with minimum assistance to maximize independence and mobility.    Baseline 10/22/19: unable to safely attempt at this time; 12/10/19: unable to safely attempt at this time, 01/14/20: unable to safety attempt at this time. 03/10/2020- Patient able to perform Stand pivot transfer with Maximal assist. 04/07/2020- Patient continues to require Max assist with sit to stand- will assist SPT next visit. 04/21/2020- Patient presents with Max assist to perform Sit to stand Transfer- unable to pivot or move Left LE well without difficulty. 07/02/2020= Max assist with SPT and minimal ability to pivot. 7/202/2022-Patient able to stand with minimal assist this visit (as good as CGA last visit) and able to move right foot to pivot and performing with moderate assist today. 09/24/2020- Patient able to perform sit to stand with Min assist from lift chair. Patient has demo inconsistency and will keep goal active.    Time 12    Period Weeks    Status On-going    Target Date 12/17/20      PT LONG TERM GOAL #5   Title The patient will demonstrate sitting without UE support for 2-5 minutes at EOB to improve participation and maximize independence with ADLs.    Baseline 10/22/19: Pt able to sit unsupported for at least 2 minutes at edge of bed. 04/21/2020- Patient continues to demo good sitting at edge of mat > 5 miin without difficulty or back support.    Time 12    Period Weeks    Status Achieved      Additional Long Term Goals   Additional Long Term Goals Yes      PT LONG TERM GOAL #6   Title Patient will demonstrate improved functional LE strength as seen by consistent ability to stand > 2 min (3 out of 4 trials) with Mod/Max A for improved LE strength with transfers.    Baseline 05/28/2020- Patient able to inconsistent stand 1-2 min right now in //bars with Mod/Max A.  07/02/2020- Patient was able to progress last 2 visit to standing using bilateral platform  attachment between 1-2 min which is a progression from the parallel bars. 08/20/2020- Patient performed static standing with min- mod A using bilateral platform attachment on walker for 6 min 30 sec today with assist for trunk control and intermittent verbal cues for posture. 09/24/2020- Patient has consistently been able to stand >2 min in // bars and when using bilateral Platform walker.    Time 12    Period Weeks    Status Achieved      PT LONG TERM GOAL #7   Title Patient/caregiver will demonstrate assisted transfer using [place] consistently Independently for improved ability to transfer at home from lift chair to bed.    Baseline 09/24/2020- Patient requires hoyer lift (Dependent) for safe transfers.    Time 12    Period Weeks    Status New    Target Date 12/17/20                   Plan - 10/29/20 1205     Clinical Impression Statement Patient able to respond well to progressive LE strengthening in chair today- able to tolerate heavy resistance in leg press and required decreased overall assist with Right LE today.  Patient will continue to benefit from continued PT services to increase mobility and strength to improve patient's quality of life    Personal Factors and Comorbidities Age;Time since onset of injury/illness/exacerbation;Comorbidity 3+;Fitness    Comorbidities HTN, quadriparesis, history of DVT, neurogenic bladder    Examination-Activity Limitations Bathing;Hygiene/Grooming;Squat;Bed Mobility;Lift;Bend;Stand;Engineer, manufacturing;Toileting;Self Feeding;Transfers;Continence;Sit;Dressing;Sleep;Carry    Examination-Participation Restrictions Church;Laundry;Cleaning;Medication Management;Community Activity;Meal Prep;Interpersonal Relationship    Stability/Clinical Decision Making Evolving/Moderate complexity    Rehab Potential Fair    PT Frequency 2x / week    PT  Duration 12 weeks    PT Treatment/Interventions ADLs/Self Care Home Management;Electrical Stimulation;Therapeutic activities;Wheelchair mobility training;Vasopneumatic Device;Joint Manipulations;Vestibular;Passive range of motion;Patient/family education;Therapeutic exercise;DME Instruction;Biofeedback;Aquatic Therapy;Moist Heat;Gait training;Balance training;Orthotic Fit/Training;Dry needling;Energy conservation;Taping;Splinting;Neuromuscular re-education;Cryotherapy;Ultrasound;Functional mobility training    PT Next Visit Plan Continue with progressive sanding/transfer training.    PT Home Exercise Plan no changes    Consulted and Agree with Plan of Care Patient             Patient will benefit from skilled therapeutic intervention in order to improve the following deficits and impairments:  Abnormal gait, Decreased balance, Decreased endurance, Decreased mobility, Difficulty walking, Hypomobility, Impaired sensation, Decreased range of motion, Improper body mechanics, Impaired perceived functional ability, Decreased activity tolerance, Decreased knowledge of use of DME, Decreased safety awareness, Decreased strength, Impaired flexibility, Impaired UE functional use, Postural dysfunction  Visit Diagnosis: Abnormality of gait and mobility  Difficulty in walking, not elsewhere classified  Unsteadiness on feet  Muscle weakness (generalized)     Problem List Patient Active Problem List   Diagnosis Date Noted   Acute appendicitis with appendiceal abscess 05/30/2018   Hypocalcemia 02/15/2018   Dysuria 02/15/2018   Bilateral lower extremity edema 02/11/2018   Vaginal yeast infection 01/30/2018   Chest tightness 01/27/2018   Healthcare-associated pneumonia 01/25/2018   Pleural effusion, not elsewhere classified 01/25/2018   Acute deep vein thrombosis (DVT) of left lower extremity ([place]) 01/24/2018   Acute deep vein thrombosis (DVT) of right lower extremity ([place]) 01/24/2018   Chronic  allergic rhinitis 01/24/2018   Depression with anxiety 01/24/2018   UTI due to Klebsiella species 01/24/2018   Protein-calorie malnutrition, severe ([place]) 01/24/2018   S/P insertion of IVC (inferior vena caval) filter 01/24/2018   Reactive depression    Hypokalemia    Leukocytosis  Essential hypertension    Trauma    Tetraparesis ([facility])    Neuropathic pain    Neurogenic bowel    Neurogenic bladder    Benign essential HTN    Acute blood loss anemia    Central cord syndrome at C6 level of cervical spinal cord ([place]) 11/29/2017   Central cord syndrome ([place]) 11/29/2017    Lewis Moccasin, PT 10/30/2020, 3:22 PM  [place] MAIN [facility] SERVICES [address] [place], [place], [address] Phone: [phone]   Fax:  [phone]  Name: Sherry [name] MRN: [id number] Date of Birth: Sep 11, 1934

## 2020-10-29 NOTE — Therapy (Signed)
[place] MAIN [facility] SERVICES [address] [place], [place], [address] Phone: [phone]   Fax:  [phone]  Occupational Therapy Treatment  Patient Details  Name: Sherry [name] MRN: [id number] Date of Birth: 06-24-1934 No data recorded  Encounter Date: 10/29/2020   OT End of Session - 10/29/20 1605     Visit Number 37    Number of Visits 96    Date for OT Re-Evaluation 01/12/21    Authorization Time Period Progress report period starting 09/08/2020    OT Start Time 108    OT Stop Time 1145    OT Time Calculation (min) 45 min    Equipment Utilized During Treatment moist heat    Activity Tolerance Patient tolerated treatment well    Behavior During Therapy [facility] for tasks assessed/performed             Past Medical History:  Diagnosis Date   Acute blood loss anemia    Arthritis    Cancer ([place])    skin   Central cord syndrome at C6 level of cervical spinal cord ([place]) 11/29/2017   Hypertension    Protein-calorie malnutrition, severe ([place]) 01/24/2018   S/P insertion of IVC (inferior vena caval) filter 01/24/2018   Tetraparesis ([place])     Past Surgical History:  Procedure Laterality Date   ANTERIOR CERVICAL DECOMP/DISCECTOMY FUSION N/A 11/29/2017   Procedure: Cervical five-six, six-seven Anterior Cervical Decompression Fusion;  Surgeon: Judith Part, MD;  Location: [place];  Service: Neurosurgery;  Laterality: N/A;  Cervical five-six, six-seven Anterior Cervical Decompression Fusion   CATARACT EXTRACTION     IR IVC FILTER PLMT / S&I /IMG GUID/MOD SED  12/08/2017    There were no vitals filed for this visit.   Subjective Assessment - 10/29/20 1605     Subjective  Patient reports that he is doing well today    Patient is accompanied by: Family member    Pertinent History Patient s/p fall November 29, 2017 resulting in diagnosis of central cord syndrome at C 6 level.  She has had therapy in multiple venues but with recent  move to [facility], therapy staff recommended she seek outpatient therapy for her needs.            OT TREATMENT     Pt. tolerated bilateral shoulder flexion, extension, abduction, elbow flexion, extension forearm supination AAROM/AROM. PROM bilateral wrist extension, digit MP, PIP, and DIP flexion, and extension, thumb radial, and palmar abduction in conjunction with moist heat. Pt. Worked on BUE strengthening, and reciprocal motion using the UBE while sitting for 87mn. with no resistance. Constant monitoring was provided. Pt. worked on reaching with abduction reaching out to the far left, and the far right using the [employer] horizontal ring tower.  Patient worked reaching out for [employer] ring with the left UE, and passing it to the right then reaching out with the right to place them on the 2nd horizontal rung. Pt. was able to perform the task more freely today. Pt. worked on Bilateral UE reaching, and ROM in order to improve functional reaching for ADL, IADL tasks, and haircare.   Pt. continues to make progress overall, and is improving with BUE reaching in preparation for improving overhead ADLs, and haircare. Pt. continues to tolerate ROM, moist heat modality, and functional reaching without pain. Pt. was able  to reach out to the far left, and far right to move the Sa[employer] rings  through the 2nd horizontal rungs of  progressively increasing heights with both the right, and left UEs. Pt. required less time to complete, and was able to reach further with no leaning noted today during reaching tasks. Pt. was able to tolerate the UBE for 8 min. With the UBE elevated for increased ROM. Pt. tolerated ROM well without reports of pain, or discomfort. Pt. continues to benefit from working on improving ROM in order to work towards increasing engagement of her bilateral hands during ADLs, and IADL tasks.                                  OT Education - 10/29/20 1605     Education  Details Bilateral upper extremity range of motion with functional reaching to the far right and left    Person(s) Educated Patient;Caregiver(s)    Methods Explanation;Demonstration    Comprehension Verbalized understanding;Need further instruction                 OT Long Term Goals - 10/20/20 1302       OT LONG TERM GOAL #1   Title Patient and caregiver will demonstrate understanding of home exercise program for ROM.    Baseline Pt. continues to have a restorative aide rehab aide assist her wih exercises at [facility]. 8/8: Independently completes/directs HEP mulitple times/week    Time 12    Period Weeks    Status Partially Met    Target Date 01/12/21      OT LONG TERM GOAL #4   Title Patient will demonstrate improved composite finger flexion to hold deodorant to apply to underarms.    Baseline 10/20/2020: Pt. conitnues to make steady progress with bilateral digit flexion.Pt. conitnues to present with stiifness/tightness which is hindering pt.'s ability to formulate a full composite fist. Pt. continues to present with improving finger flexion of R hand for a partial gross grasp, but unable to grasp and hold items. pt. conitnues to present with limited digit flexion of L hand. Pt. has active left thumb abduction. 8/8: holds cup with 2 hands, continues to have limited composite finger flexion L worse than R    Time 12    Period Weeks    Status On-going    Target Date 01/12/21      OT LONG TERM GOAL #6   Title Patient will increase right UE ROM to comb the back of her hair with modified independence.    Baseline 10/20/2020: Pt. continues to progress wtith RRUE ROM, and is able to use a pic for the back of her head, however is unable to brush the back of her hair. 07/30/2020: Pt. is now able to comb the back of her hair with a pic, however continues to work on completing the task efficiently. 8/8: Achieves with great effort, has assistance for efficiancy    Time 12    Period Weeks     Status Partially Met    Target Date 01/12/21      OT LONG TERM GOAL #7   Title Pt. will independently, and efficiently send text messages on her phone.    Baseline 10/20/2020: Pt. is now able to type a text message on the phone, however not efficiently taking increased time to complete. 07/30/2020: Pt. continues to present with limited BUE strength, and [facility] skills. 8/8: independtly drafts text messages, continues to work on accuracy and efficiancy 2/2 limited [facility]    Time 12    Period Weeks  Status Partially Met    Target Date 01/12/21      OT LONG TERM GOAL #8   Title Pt. will write, and sign her name with 100% legibility, and modified independence.    Baseline 10/20/2020: Pt. continues to make progress overall. Pt. continues to consistently fill out her daily menu, and writing for crossword puzzles. Pt. is able to maintain grasp on a wide width pen. [place] to work on Media pl[employer]. 8/8: writes daily, continues to work on improving speed    Time 12    Period Weeks    Status Partially Met    Target Date 01/12/21                   Plan - 10/29/20 1606     Clinical Impression Statement Pt. continues to make progress overall, and is improving with BUE reaching in preparation for improving overhead ADLs, and haircare. Pt. continues to tolerate ROM, moist heat modality, and functional reaching without pain. Pt. was able  to reach out to the far left, and far right to move the Sa[employer] rings  through the 2nd horizontal rungs of progressively increasing heights with both the right, and left UEs. Pt. required less time to complete, and was able to reach further with no leaning noted today during reaching tasks. Pt. was able to tolerate the UBE for 8 min. With the UBE elevated for increased ROM. Pt. tolerated ROM well without reports of pain, or discomfort. Pt. continues to benefit from working on improving ROM in order to work towards increasing engagement of her bilateral hands during  ADLs, and IADL tasks.    OT Occupational Profile and History Detailed Assessment- Review of Records and additional review of physical, cognitive, psychosocial history related to current functional performance    Occupational performance deficits (Please refer to evaluation for details): ADL's;IADL's;Leisure;Social Participation    Psychosocial Skills Coping Strategies    Rehab Potential Fair    Clinical Decision Making Several treatment options, min-mod task modification necessary    Comorbidities Affecting Occupational Performance: Presence of comorbidities impacting occupational performance    Comorbidities impacting occupational performance description: contractures of bilateral hands, dependent transfers,    Modification or Assistance to Complete Evaluation  No modification of tasks or assist necessary to complete eval    OT Frequency 2x / week    OT Duration 12 weeks    OT Treatment/Interventions Self-care/ADL training;Cryotherapy;Therapeutic exercise;DME and/or AE instruction;Balance training;Neuromuscular education;Manual Therapy;Splinting;Moist Heat;Passive range of motion;Therapeutic activities;Patient/family education    Consulted and Agree with Plan of Care Patient             Patient will benefit from skilled therapeutic intervention in order to improve the following deficits and impairments:       Psychosocial Skills: Coping Strategies   Visit Diagnosis: Muscle weakness (generalized)    Problem List Patient Active Problem List   Diagnosis Date Noted   Acute appendicitis with appendiceal abscess 05/30/2018   Hypocalcemia 02/15/2018   Dysuria 02/15/2018   Bilateral lower extremity edema 02/11/2018   Vaginal yeast infection 01/30/2018   Chest tightness 01/27/2018   Healthcare-associated pneumonia 01/25/2018   Pleural effusion, not elsewhere classified 01/25/2018   Acute deep vein thrombosis (DVT) of left lower extremity ([place]) 01/24/2018   Acute deep vein  thrombosis (DVT) of right lower extremity ([place]) 01/24/2018   Chronic allergic rhinitis 01/24/2018   Depression with anxiety 01/24/2018   UTI due to Klebsiella species 01/24/2018   Protein-calorie malnutrition, severe ([place]) 01/24/2018  S/P insertion of IVC (inferior vena caval) filter 01/24/2018   Reactive depression    Hypokalemia    Leukocytosis    Essential hypertension    Trauma    Tetraparesis ([facility])    Neuropathic pain    Neurogenic bowel    Neurogenic bladder    Benign essential HTN    Acute blood loss anemia    Central cord syndrome at C6 level of cervical spinal cord ([place]) 11/29/2017   Central cord syndrome ([place]) 11/29/2017    Harrel Carina, MS, OTR/L 10/29/2020, 4:07 PM  [place] MAIN [facility] SERVICES [address] [place], [place], [address] Phone: [phone]   Fax:  [phone]  Name: Sherry [name] MRN: [id number] Date of Birth: [birth year]

## 2020-11-03 ENCOUNTER — Encounter: Payer: Self-pay | Admitting: Occupational Therapy

## 2020-11-03 ENCOUNTER — Other Ambulatory Visit: Payer: Self-pay

## 2020-11-03 ENCOUNTER — Ambulatory Visit: Payer: Medicare HMO | Attending: Internal Medicine | Admitting: Occupational Therapy

## 2020-11-03 ENCOUNTER — Ambulatory Visit: Payer: Medicare HMO

## 2020-11-03 DIAGNOSIS — R278 Other lack of coordination: Secondary | ICD-10-CM | POA: Diagnosis present

## 2020-11-03 DIAGNOSIS — R262 Difficulty in walking, not elsewhere classified: Secondary | ICD-10-CM

## 2020-11-03 DIAGNOSIS — M6281 Muscle weakness (generalized): Secondary | ICD-10-CM

## 2020-11-03 DIAGNOSIS — R269 Unspecified abnormalities of gait and mobility: Secondary | ICD-10-CM | POA: Diagnosis present

## 2020-11-03 DIAGNOSIS — S14129S Central cord syndrome at unspecified level of cervical spinal cord, sequela: Secondary | ICD-10-CM | POA: Diagnosis present

## 2020-11-03 DIAGNOSIS — R2681 Unsteadiness on feet: Secondary | ICD-10-CM | POA: Diagnosis present

## 2020-11-03 NOTE — Therapy (Signed)
[place] MAIN [facility] SERVICES [address] [place], [place], [address] Phone: [phone]   Fax:  [phone]  Physical Therapy Treatment  Patient Details  Name: Sherry [name] MRN: [id number] Date of Birth: 04/20/1934 No data recorded  Encounter Date: 11/03/2020   PT End of Session - 11/03/20 1152     Visit Number 98    Number of Visits 104    Date for PT Re-Evaluation 12/17/20    Authorization Type [employer] FOTO performed by PT on eval (7/20), score 12, Progress note on 03/10/2020; PN on 08/20/2020    Authorization Time Period Recert 06/02/8839- 6/60/6301; PN on 6/0/1093; Recert 2/35/5732- 20/25/4270; PN on 10/01/2020    PT Start Time 1145    PT Stop Time 1226    PT Time Calculation (min) 41 min    Equipment Utilized During Treatment Gait belt    Activity Tolerance Patient tolerated treatment well;Patient limited by fatigue;Patient limited by pain    Behavior During Therapy WFL for tasks assessed/performed             Past Medical History:  Diagnosis Date   Acute blood loss anemia    Arthritis    Cancer ([place])    skin   Central cord syndrome at C6 level of cervical spinal cord ([place]) 11/29/2017   Hypertension    Protein-calorie malnutrition, severe ([place]) 01/24/2018   S/P insertion of IVC (inferior vena caval) filter 01/24/2018   Tetraparesis ([place])     Past Surgical History:  Procedure Laterality Date   ANTERIOR CERVICAL DECOMP/DISCECTOMY FUSION N/A 11/29/2017   Procedure: Cervical five-six, six-seven Anterior Cervical Decompression Fusion;  Surgeon: Judith Part, MD;  Location: [facility];  Service: Neurosurgery;  Laterality: N/A;  Cervical five-six, six-seven Anterior Cervical Decompression Fusion   CATARACT EXTRACTION     IR IVC FILTER PLMT / S&I /IMG GUID/MOD SED  12/08/2017    There were no vitals filed for this visit.   Subjective Assessment - 11/03/20 1148     Subjective Patient reports having a good weekend  and denies any pain or issues.    Pertinent History Pt is an 86 yo female that fell in 2019, fractured vertebrae in her neck and in her low back per family. 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 After hospital stay, pt discharged to [facility] for ~1 month, experienced severe UTI as well as bilateral DVT (IVC filter placed). Discharged to [facility], stayed for about a year, and then moved to [facility] in April 2021. Was receiving PT, but per family facility reported that did not have adequate equipment to maximize PT for her. Pt until about 1 month ago was practicing sit to stand transfers with 1-2 people, and working on static standing. Has a brace for L foot due to PF. Pt currently needs assistance with all ADLs (able to assist with donning/doffing her shirt), bed baths, and needs a hoyer lift for transfers. Able to drive power wheelchair without assistance.    Currently in Pain? No/denies              INTERVENTIONS:   Therapeutic Exercises:    In reclined position- Single leg press with moderate to heavy manual resistance, 2 sets x15 each LE- Decreased Right quad control vs. Left but able to tolerate mod to heavy resistance.  Seated LE - Hip march- AAROM 2 x 15 reps each Seated LE - Knee ext w/ 3 second hold - AROM on left and AAROM  ham curls- placing pillow case under foot x 20 reps.  AAROM B hip abduction 2 sets of 15 reps  Sit to stand transfer from power wheelchair with max assist today - standing to walker with dual platforms x 4 trials with VC for scooting forward and to keep left knee flexed/blocked to avoid excessive kicking leg out while standing.   4 trials of static standing using platform walker- patient unable to stand longer than 45 sec with difficulty attaining center of mass today- c/o of more difficulty keeping left knee from hyperextending.   Clinical Impression: Patient experienced more difficulty with standing using forearm platform attachment  walker today than in prior visits. She did perform well with all seated therex with some physical assist  but fatigued quicker today overall. She also required more physical assist to stand than past few visits. Patient will continue to benefit from continued PT services to increase mobility and strength to improve patient's quality of life                         PT Education - 11/03/20 1150     Education Details Exercise technique with LE strengthening    Person(s) Educated Patient    Methods Explanation;Demonstration;Tactile cues;Verbal cues    Comprehension Verbalized understanding;Returned demonstration;Verbal cues required;Need further instruction              PT Short Term Goals - 03/10/20 2211       PT SHORT TERM GOAL #1   Title The patient will perform initial HEP with minimum assistance in order to improve strength and function.    Baseline Patient demonstrating independence with initial HEP as of 03/10/2020 with no questions or difficulty.    Time 4    Period Weeks    Status Achieved    Target Date 02/11/20               PT Long Term Goals - 09/24/20 1427       PT LONG TERM GOAL #1   Title The patient will be compliant with finalized HEP with minimum assistance in preparation for self management and maintenance of condition.    Baseline 03/10/2020- Patient familiar with initial HEP and will keep goal active as HEP becomes more progressive including possible transfers and standing activities. 04/07/2020- Patient continues to report compliance with LE strengthening home exercises without questions or concerns. 04/21/2020- Patient able to verbalize and demonstrate good understanding of current HEP including some supine and seated LE strengthening exercises. Reviewed and patient performed 10 reps today with minimal cueing. Will keep goal active to progress HEP as appropriate. 05/28/2020- patient reports continues to perform LE strengthening as able and  some help from caregiver as able. No changes at this time to home program as patient is limited to supine/seated therex. 07/02/2020- Patient continues to report compliance with current supine and seated LE strengtheing home program and states no questions or concerns regarding her plan.    Time 12    Period Weeks    Status Achieved      PT LONG TERM GOAL #2   Title The patient will demonstrate at least 10 point improvement on FOTO score indicating an improved ability to perform functional activities.    Baseline on eval (/720) score 12; 10/22/19: 12; 12/10/19: 18, 12/13: 18: 04/07/2020- Will assess next visit. 07/02/2020- Will obtain next visit. 09/24/2020= Will obtain next visit.    Time 12    Period Weeks  Status On-going    Target Date 12/17/20      PT LONG TERM GOAL #3   Title The patient will demonstrate lateral scooting  for assistance with transfers with minA to maximize independence.    Baseline 10/22/19: Pt requires modA+1 for lateral scooting; 12/10/19: modA+1, 12/13: modA-maxA, 12/27: modA-maxA, 03/10/2020- ModA-MaxA- increased verbal cues and visual demonstration. 04/07/2020- Continued Max assist to perform forward/lateral scoot. 04/21/2020- Patient was able to demo slight lateral scoot with CGA approx 12 in then fatigued requiring max assist- she is able to scoot forward by leaning without significant issues. 05/28/2020- While sitting in chair- patient able to perform forward and lateral scoot with VC/visual demo and increased time allowed- CGA today but not consistent yet- will keep goal active at this time to continue to focus on strengthening and improving technique. 07/02/2020- Patient able to demonstrate minimal ability to laterally shift weigh on mat table requiring max assist yet is able to forward scoot out to edge of mat table with min Assist. 08/20/2020 - Patient was able to demonstrate minimal lateral scooting at edge of mat while holding feet still so they did not slip on tile surface. She  continues to be able to demonstrate scoot forward and uses tilt option to recline to position safely back into seat of power wheelchair. 09/25/2020- Patient able to sit on mat and minimally scoot laterally but requires min/mod A for efficient scooting. Will end goal at this time due to plateau in progress with scooting over extended time.    Time 12    Period Weeks    Status Not Met    Target Date 12/17/20      PT LONG TERM GOAL #4   Title The patient will demonstrate a squat pivot transfer with minimum assistance to maximize independence and mobility.    Baseline 10/22/19: unable to safely attempt at this time; 12/10/19: unable to safely attempt at this time, 01/14/20: unable to safety attempt at this time. 03/10/2020- Patient able to perform Stand pivot transfer with Maximal assist. 04/07/2020- Patient continues to require Max assist with sit to stand- will assist SPT next visit. 04/21/2020- Patient presents with Max assist to perform Sit to stand Transfer- unable to pivot or move Left LE well without difficulty. 07/02/2020= Max assist with SPT and minimal ability to pivot. 7/202/2022-Patient able to stand with minimal assist this visit (as good as CGA last visit) and able to move right foot to pivot and performing with moderate assist today. 09/24/2020- Patient able to perform sit to stand with Min assist from lift chair. Patient has demo inconsistency and will keep goal active.    Time 12    Period Weeks    Status On-going    Target Date 12/17/20      PT LONG TERM GOAL #5   Title The patient will demonstrate sitting without UE support for 2-5 minutes at EOB to improve participation and maximize independence with ADLs.    Baseline 10/22/19: Pt able to sit unsupported for at least 2 minutes at edge of bed. 04/21/2020- Patient continues to demo good sitting at edge of mat > 5 miin without difficulty or back support.    Time 12    Period Weeks    Status Achieved      Additional Long Term Goals    Additional Long Term Goals Yes      PT LONG TERM GOAL #6   Title Patient will demonstrate improved functional LE strength as seen by consistent ability to  stand > 2 min (3 out of 4 trials) with Mod/Max A for improved LE strength with transfers.    Baseline 05/28/2020- Patient able to inconsistent stand 1-2 min right now in //bars with Mod/Max A. 07/02/2020- Patient was able to progress last 2 visit to standing using bilateral platform attachment between 1-2 min which is a progression from the parallel bars. 08/20/2020- Patient performed static standing with min- mod A using bilateral platform attachment on walker for 6 min 30 sec today with assist for trunk control and intermittent verbal cues for posture. 09/24/2020- Patient has consistently been able to stand >2 min in // bars and when using bilateral Platform walker.    Time 12    Period Weeks    Status Achieved      PT LONG TERM GOAL #7   Title Patient/caregiver will demonstrate assisted transfer using [place] consistently Independently for improved ability to transfer at home from lift chair to bed.    Baseline 09/24/2020- Patient requires hoyer lift (Dependent) for safe transfers.    Time 12    Period Weeks    Status New    Target Date 12/17/20                   Plan - 11/03/20 1657     Clinical Impression Statement Patient experienced more difficulty with standing using forearm platform attachment walker today than in prior visits. She did perform well with all seated therex with some physical assist  but fatigued quicker today overall. She also required more physical assist to stand than past few visits. Patient will continue to benefit from continued PT services to increase mobility and strength to improve patient's quality of life    Personal Factors and Comorbidities Age;Time since onset of injury/illness/exacerbation;Comorbidity 3+;Fitness    Comorbidities HTN, quadriparesis, history of DVT, neurogenic bladder     Examination-Activity Limitations Bathing;Hygiene/Grooming;Squat;Bed Mobility;Lift;Bend;Stand;Engineer, manufacturing;Toileting;Self Feeding;Transfers;Continence;Sit;Dressing;Sleep;Carry    Examination-Participation Restrictions Church;Laundry;Cleaning;Medication Management;Community Activity;Meal Prep;Interpersonal Relationship    Stability/Clinical Decision Making Evolving/Moderate complexity    Rehab Potential Fair    PT Frequency 2x / week    PT Duration 12 weeks    PT Treatment/Interventions ADLs/Self Care Home Management;Electrical Stimulation;Therapeutic activities;Wheelchair mobility training;Vasopneumatic Device;Joint Manipulations;Vestibular;Passive range of motion;Patient/family education;Therapeutic exercise;DME Instruction;Biofeedback;Aquatic Therapy;Moist Heat;Gait training;Balance training;Orthotic Fit/Training;Dry needling;Energy conservation;Taping;Splinting;Neuromuscular re-education;Cryotherapy;Ultrasound;Functional mobility training    PT Next Visit Plan Continue with progressive sanding/transfer training.    PT Home Exercise Plan no changes    Consulted and Agree with Plan of Care Patient             Patient will benefit from skilled therapeutic intervention in order to improve the following deficits and impairments:  Abnormal gait, Decreased balance, Decreased endurance, Decreased mobility, Difficulty walking, Hypomobility, Impaired sensation, Decreased range of motion, Improper body mechanics, Impaired perceived functional ability, Decreased activity tolerance, Decreased knowledge of use of DME, Decreased safety awareness, Decreased strength, Impaired flexibility, Impaired UE functional use, Postural dysfunction  Visit Diagnosis: Abnormality of gait and mobility  Difficulty in walking, not elsewhere classified  Muscle weakness (generalized)  Unsteadiness on feet     Problem List Patient Active Problem List   Diagnosis Date Noted   Acute appendicitis  with appendiceal abscess 05/30/2018   Hypocalcemia 02/15/2018   Dysuria 02/15/2018   Bilateral lower extremity edema 02/11/2018   Vaginal yeast infection 01/30/2018   Chest tightness 01/27/2018   Healthcare-associated pneumonia 01/25/2018   Pleural effusion, not elsewhere classified 01/25/2018   Acute deep vein thrombosis (DVT) of left  lower extremity ([place]) 01/24/2018   Acute deep vein thrombosis (DVT) of right lower extremity ([place]) 01/24/2018   Chronic allergic rhinitis 01/24/2018   Depression with anxiety 01/24/2018   UTI due to Klebsiella species 01/24/2018   Protein-calorie malnutrition, severe ([place]) 01/24/2018   S/P insertion of IVC (inferior vena caval) filter 01/24/2018   Reactive depression    Hypokalemia    Leukocytosis    Essential hypertension    Trauma    Tetraparesis ([facility])    Neuropathic pain    Neurogenic bowel    Neurogenic bladder    Benign essential HTN    Acute blood loss anemia    Central cord syndrome at C6 level of cervical spinal cord ([place]) 11/29/2017   Central cord syndrome ([place]) 11/29/2017    Sherry [name], PT 11/03/2020, 5:07 PM  [place] MAIN [facility] SERVICES [address] [place], [place], [address] Phone: [phone]   Fax:  [phone]  Name: Sherry [name] MRN: [id number] Date of Birth: 08-Dec-1934

## 2020-11-03 NOTE — Therapy (Signed)
[place] [facility] [address] [place], [place], [address] Phone: [phone]   Fax:  [phone]  Occupational Therapy Treatment  Patient Details  Name: Sherry [name] MRN: [id number] Date of Birth: 1934/07/21 No data recorded  Encounter Date: 11/03/2020   OT End of Session - 11/03/20 1106     Visit Number 74    Number of Visits 96    Date for OT Re-Evaluation 01/12/21    Authorization Time Period Progress report period starting 09/08/2020    OT Start Time 75    OT Stop Time 1145    OT Time Calculation (min) 45 min    Equipment Utilized During Treatment moist heat    Activity Tolerance Patient tolerated treatment well    Behavior During Therapy WFL for tasks assessed/performed             Past Medical History:  Diagnosis Date   Acute blood loss anemia    Arthritis    Cancer ([place])    skin   Central cord syndrome at C6 level of cervical spinal cord ([place]) 11/29/2017   Hypertension    Protein-calorie malnutrition, severe ([place]) 01/24/2018   S/P insertion of IVC (inferior vena caval) filter 01/24/2018   Tetraparesis ([place])     Past Surgical History:  Procedure Laterality Date   ANTERIOR CERVICAL DECOMP/DISCECTOMY FUSION N/A 11/29/2017   Procedure: Cervical five-six, six-seven Anterior Cervical Decompression Fusion;  Surgeon: Judith Part, MD;  Location: [place];  Service: Neurosurgery;  Laterality: N/A;  Cervical five-six, six-seven Anterior Cervical Decompression Fusion   CATARACT EXTRACTION     IR IVC FILTER PLMT / S&I /IMG GUID/MOD SED  12/08/2017    There were no vitals filed for this visit.   Subjective Assessment - 11/03/20 1104     Subjective  Patient reports that he is doing well today    Patient is accompanied by: Family member    Pertinent History Patient s/p fall November 29, 2017 resulting in diagnosis of central cord syndrome at C 6 level.  She has had therapy in multiple venues but with recent  move to [facility], therapy staff recommended she seek outpatient therapy for her needs.    Patient Stated Goals Patient would like to be able to move better in bed, stand and perform self care tasks.    Currently in Pain? No/denies            OT TREATMENT     Pt. tolerated bilateral shoulder flexion, extension, abduction, elbow flexion, extension forearm supination AAROM/AROM. PROM bilateral wrist extension, digit MP, PIP, and DIP flexion, and extension, thumb radial, and palmar abduction in conjunction with moist heat. Pt. Worked on BUE strengthening, and reciprocal motion using the UBE while sitting for 36mn. with no resistance initially, and increasing resistance at 6 min.   Pt. continues to make progress overall, and is improving with BUE use for folding her afghan,  as well asreaching in preparation for improving overhead ADLs, and haircare. Pt. continues to tolerate ROM, moist heat modality, and functional reaching without pain. Pt. required less time to complete, and was able to reach further with no leaning noted today during reaching tasks. Pt. was able to tolerate the UBE for 8 min. With the UBE elevated for increased ROM. Pt. tolerated ROM well without reports of pain, or discomfort. Pt. continues to benefit from working on improving ROM in order to work towards increasing engagement of her bilateral hands during ADLs, and IADL  tasks.                           OT Education - 11/03/20 1106     Education Details Bilateral upper extremity range of motion with functional reaching to the far right and left    Person(s) Educated Patient;Caregiver(s)    Methods Explanation;Demonstration    Comprehension Verbalized understanding;Need further instruction                 OT Long Term Goals - 10/20/20 1302       OT LONG TERM GOAL #1   Title Patient and caregiver will demonstrate understanding of home exercise program for ROM.    Baseline Pt. continues to have  a restorative aide rehab aide assist her wih exercises at [facility]. 8/8: Independently completes/directs HEP mulitple times/week    Time 12    Period Weeks    Status Partially Met    Target Date 01/12/21      OT LONG TERM GOAL #4   Title Patient will demonstrate improved composite finger flexion to hold deodorant to apply to underarms.    Baseline 10/20/2020: Pt. conitnues to make steady progress with bilateral digit flexion.Pt. conitnues to present with stiifness/tightness which is hindering pt.'s ability to formulate a full composite fist. Pt. continues to present with improving finger flexion of R hand for a partial gross grasp, but unable to grasp and hold items. pt. conitnues to present with limited digit flexion of L hand. Pt. has active left thumb abduction. 8/8: holds cup with 2 hands, continues to have limited composite finger flexion L worse than R    Time 12    Period Weeks    Status On-going    Target Date 01/12/21      OT LONG TERM GOAL #6   Title Patient will increase right UE ROM to comb the back of her hair with modified independence.    Baseline 10/20/2020: Pt. continues to progress wtith RRUE ROM, and is able to use a pic for the back of her head, however is unable to brush the back of her hair. 07/30/2020: Pt. is now able to comb the back of her hair with a pic, however continues to work on completing the task efficiently. 8/8: Achieves with great effort, has assistance for efficiancy    Time 12    Period Weeks    Status Partially Met    Target Date 01/12/21      OT LONG TERM GOAL #7   Title Pt. will independently, and efficiently send text messages on her phone.    Baseline 10/20/2020: Pt. is now able to type a text message on the phone, however not efficiently taking increased time to complete. 07/30/2020: Pt. continues to present with limited BUE strength, and [facility] skills. 8/8: independtly drafts text messages, continues to work on accuracy and efficiancy 2/2 limited [facility]     Time 12    Period Weeks    Status Partially Met    Target Date 01/12/21      OT LONG TERM GOAL #8   Title Pt. will write, and sign her name with 100% legibility, and modified independence.    Baseline 10/20/2020: Pt. continues to make progress overall. Pt. continues to consistently fill out her daily menu, and writing for crossword puzzles. Pt. is able to maintain grasp on a wide width pen. [place] to work on Media pl[employer]. 8/8: writes daily, continues to work on improving  speed    Time 12    Period Weeks    Status Partially Met    Target Date 01/12/21                   Plan - 11/03/20 1107     OT Occupational Profile and History Detailed Assessment- Review of Records and additional review of physical, cognitive, psychosocial history related to current functional performance    Occupational performance deficits (Please refer to evaluation for details): ADL's;IADL's;Leisure;Social Participation    Body Structure / Function / Physical Skills ADL;Continence;Dexterity;Flexibility;Strength;ROM;Balance;Coordination;FMC;IADL;Endurance;UE functional use;Decreased knowledge of use of DME;GMC    Psychosocial Skills Coping Strategies    Rehab Potential Fair    Clinical Decision Making Several treatment options, min-mod task modification necessary    Comorbidities Affecting Occupational Performance: Presence of comorbidities impacting occupational performance    Modification or Assistance to Complete Evaluation  No modification of tasks or assist necessary to complete eval    OT Frequency 2x / week    OT Duration 12 weeks    OT Treatment/Interventions Self-care/ADL training;Cryotherapy;Therapeutic exercise;DME and/or AE instruction;Balance training;Neuromuscular education;Manual Therapy;Splinting;Moist Heat;Passive range of motion;Therapeutic activities;Patient/family education    Plan continue to progress ROM of digits, wrists and shoulders as it pertains to completion of ADL tasks  that pt values.    Consulted and Agree with Plan of Care Patient             Patient will benefit from skilled therapeutic intervention in order to improve the following deficits and impairments:   Body Structure / Function / Physical Skills: ADL, Continence, Dexterity, Flexibility, Strength, ROM, Balance, Coordination, FMC, IADL, Endurance, UE functional use, Decreased knowledge of use of DME, GMC   Psychosocial Skills: Coping Strategies   Visit Diagnosis: Muscle weakness (generalized)    Problem List Patient Active Problem List   Diagnosis Date Noted   Acute appendicitis with appendiceal abscess 05/30/2018   Hypocalcemia 02/15/2018   Dysuria 02/15/2018   Bilateral lower extremity edema 02/11/2018   Vaginal yeast infection 01/30/2018   Chest tightness 01/27/2018   Healthcare-associated pneumonia 01/25/2018   Pleural effusion, not elsewhere classified 01/25/2018   Acute deep vein thrombosis (DVT) of left lower extremity ([place]) 01/24/2018   Acute deep vein thrombosis (DVT) of right lower extremity ([place]) 01/24/2018   Chronic allergic rhinitis 01/24/2018   Depression with anxiety 01/24/2018   UTI due to Klebsiella species 01/24/2018   Protein-calorie malnutrition, severe ([place]) 01/24/2018   S/P insertion of IVC (inferior vena caval) filter 01/24/2018   Reactive depression    Hypokalemia    Leukocytosis    Essential hypertension    Trauma    Tetraparesis ([facility])    Neuropathic pain    Neurogenic bowel    Neurogenic bladder    Benign essential HTN    Acute blood loss anemia    Central cord syndrome at C6 level of cervical spinal cord ([place]) 11/29/2017   Central cord syndrome ([place]) 11/29/2017    Harrel Carina, OT/L 11/03/2020, 11:10 AM  [place], [place], [address] Phone: [phone]   Fax:  [phone]  Name: Sherry [name] MRN: [id number] Date of Birth: 01/24/1935

## 2020-11-05 ENCOUNTER — Other Ambulatory Visit: Payer: Self-pay

## 2020-11-05 ENCOUNTER — Ambulatory Visit: Payer: Medicare HMO | Admitting: Occupational Therapy

## 2020-11-05 ENCOUNTER — Encounter: Payer: Self-pay | Admitting: Occupational Therapy

## 2020-11-05 ENCOUNTER — Ambulatory Visit: Payer: Medicare HMO

## 2020-11-05 DIAGNOSIS — R278 Other lack of coordination: Secondary | ICD-10-CM

## 2020-11-05 DIAGNOSIS — R262 Difficulty in walking, not elsewhere classified: Secondary | ICD-10-CM

## 2020-11-05 DIAGNOSIS — R269 Unspecified abnormalities of gait and mobility: Secondary | ICD-10-CM

## 2020-11-05 DIAGNOSIS — M6281 Muscle weakness (generalized): Secondary | ICD-10-CM

## 2020-11-05 DIAGNOSIS — R2681 Unsteadiness on feet: Secondary | ICD-10-CM

## 2020-11-05 NOTE — Therapy (Signed)
[place] MAIN [facility] SERVICES [address] [place], [place], [address] Phone: [phone]   Fax:  [phone]  Physical Therapy Treatment  Patient Details  Name: Sherry [name] MRN: [id number] Date of Birth: 10-Nov-1934 No data recorded  Encounter Date: 11/05/2020   PT End of Session - 11/05/20 1156     Visit Number 99    Number of Visits 104    Date for PT Re-Evaluation 12/17/20    Authorization Type [employer] FOTO performed by PT on eval (7/20), score 12, Progress note on 03/10/2020; PN on 08/20/2020    Authorization Time Period Recert 09/01/173- 02/02/5850; PN on 08/07/8240; Recert 3/53/6144- 31/54/0086; PN on 10/01/2020    PT Start Time 1145    PT Stop Time 1218    PT Time Calculation (min) 33 min    Equipment Utilized During Treatment Gait belt    Activity Tolerance Patient tolerated treatment well;Patient limited by fatigue;Patient limited by pain    Behavior During Therapy WFL for tasks assessed/performed             Past Medical History:  Diagnosis Date   Acute blood loss anemia    Arthritis    Cancer ([place])    skin   Central cord syndrome at C6 level of cervical spinal cord ([place]) 11/29/2017   Hypertension    Protein-calorie malnutrition, severe ([place]) 01/24/2018   S/P insertion of IVC (inferior vena caval) filter 01/24/2018   Tetraparesis ([place])     Past Surgical History:  Procedure Laterality Date   ANTERIOR CERVICAL DECOMP/DISCECTOMY FUSION N/A 11/29/2017   Procedure: Cervical five-six, six-seven Anterior Cervical Decompression Fusion;  Surgeon: Judith Part, MD;  Location: [facility];  Service: Neurosurgery;  Laterality: N/A;  Cervical five-six, six-seven Anterior Cervical Decompression Fusion   CATARACT EXTRACTION     IR IVC FILTER PLMT / S&I /IMG GUID/MOD SED  12/08/2017    There were no vitals filed for this visit.   Subjective Assessment - 11/05/20 1151     Subjective Patient reports doing well overall today  and denies any pain or issues.    Pertinent History Pt is an 86 yo female that fell in 2019, fractured vertebrae in her neck and in her low back per family. 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 After hospital stay, pt discharged to [facility] for ~1 month, experienced severe UTI as well as bilateral DVT (IVC filter placed). Discharged to [facility], stayed for about a year, and then moved to [facility] in April 2021. Was receiving PT, but per family facility reported that did not have adequate equipment to maximize PT for her. Pt until about 1 month ago was practicing sit to stand transfers with 1-2 people, and working on static standing. Has a brace for L foot due to PF. Pt currently needs assistance with all ADLs (able to assist with donning/doffing her shirt), bed baths, and needs a hoyer lift for transfers. Able to drive power wheelchair without assistance.    Currently in Pain? No/denies               Interventions:   Therapeutic Exercises:    In reclined position- Single leg press with heavy manual resistance, 2 sets x15 each LE  Bridging with 3 sec hold 2 sets of 10 reps (Physical assist to maintain Feet on foot rest)   Hip abduction with min physical Assistance B LE 2 sets of 15 reps  Quad sets BLE with 5 sec hold  2 sets of 10 reps  Seated LE - Hip march- AAROM 2 x 15 reps each Seated LE - Knee ext w/ 3 second hold - AROM on left and AAROM on right LE  Education provided throughout session via VC/TC and demonstration to facilitate movement at target joints and correct muscle activation for all  exercises performed.     Clinical Impression: Patient able to improve her ability to perform bridging well today with less physical assistance. She was fatigued at end of session reporting tired but no soreness or pain. She is requiring less physical assist with Right LE activities. Patient will continue to benefit from continued PT services to increase mobility and  strength to improve patient's quality of life                        PT Education - 11/05/20 1151     Education Details Exercise technique    Person(s) Educated Patient    Methods Explanation;Demonstration;Tactile cues;Verbal cues    Comprehension Verbalized understanding;Returned demonstration;Verbal cues required;Need further instruction              PT Short Term Goals - 03/10/20 2211       PT SHORT TERM GOAL #1   Title The patient will perform initial HEP with minimum assistance in order to improve strength and function.    Baseline Patient demonstrating independence with initial HEP as of 03/10/2020 with no questions or difficulty.    Time 4    Period Weeks    Status Achieved    Target Date 02/11/20               PT Long Term Goals - 09/24/20 1427       PT LONG TERM GOAL #1   Title The patient will be compliant with finalized HEP with minimum assistance in preparation for self management and maintenance of condition.    Baseline 03/10/2020- Patient familiar with initial HEP and will keep goal active as HEP becomes more progressive including possible transfers and standing activities. 04/07/2020- Patient continues to report compliance with LE strengthening home exercises without questions or concerns. 04/21/2020- Patient able to verbalize and demonstrate good understanding of current HEP including some supine and seated LE strengthening exercises. Reviewed and patient performed 10 reps today with minimal cueing. Will keep goal active to progress HEP as appropriate. 05/28/2020- patient reports continues to perform LE strengthening as able and some help from caregiver as able. No changes at this time to home program as patient is limited to supine/seated therex. 07/02/2020- Patient continues to report compliance with current supine and seated LE strengtheing home program and states no questions or concerns regarding her plan.    Time 12    Period Weeks     Status Achieved      PT LONG TERM GOAL #2   Title The patient will demonstrate at least 10 point improvement on FOTO score indicating an improved ability to perform functional activities.    Baseline on eval (/720) score 12; 10/22/19: 12; 12/10/19: 18, 12/13: 18: 04/07/2020- Will assess next visit. 07/02/2020- Will obtain next visit. 09/24/2020= Will obtain next visit.    Time 12    Period Weeks    Status On-going    Target Date 12/17/20      PT LONG TERM GOAL #3   Title The patient will demonstrate lateral scooting  for assistance with transfers with minA to maximize independence.    Baseline 10/22/19: Pt  requires modA+1 for lateral scooting; 12/10/19: modA+1, 12/13: modA-maxA, 12/27: modA-maxA, 03/10/2020- ModA-MaxA- increased verbal cues and visual demonstration. 04/07/2020- Continued Max assist to perform forward/lateral scoot. 04/21/2020- Patient was able to demo slight lateral scoot with CGA approx 12 in then fatigued requiring max assist- she is able to scoot forward by leaning without significant issues. 05/28/2020- While sitting in chair- patient able to perform forward and lateral scoot with VC/visual demo and increased time allowed- CGA today but not consistent yet- will keep goal active at this time to continue to focus on strengthening and improving technique. 07/02/2020- Patient able to demonstrate minimal ability to laterally shift weigh on mat table requiring max assist yet is able to forward scoot out to edge of mat table with min Assist. 08/20/2020 - Patient was able to demonstrate minimal lateral scooting at edge of mat while holding feet still so they did not slip on tile surface. She continues to be able to demonstrate scoot forward and uses tilt option to recline to position safely back into seat of power wheelchair. 09/25/2020- Patient able to sit on mat and minimally scoot laterally but requires min/mod A for efficient scooting. Will end goal at this time due to plateau in progress with scooting  over extended time.    Time 12    Period Weeks    Status Not Met    Target Date 12/17/20      PT LONG TERM GOAL #4   Title The patient will demonstrate a squat pivot transfer with minimum assistance to maximize independence and mobility.    Baseline 10/22/19: unable to safely attempt at this time; 12/10/19: unable to safely attempt at this time, 01/14/20: unable to safety attempt at this time. 03/10/2020- Patient able to perform Stand pivot transfer with Maximal assist. 04/07/2020- Patient continues to require Max assist with sit to stand- will assist SPT next visit. 04/21/2020- Patient presents with Max assist to perform Sit to stand Transfer- unable to pivot or move Left LE well without difficulty. 07/02/2020= Max assist with SPT and minimal ability to pivot. 7/202/2022-Patient able to stand with minimal assist this visit (as good as CGA last visit) and able to move right foot to pivot and performing with moderate assist today. 09/24/2020- Patient able to perform sit to stand with Min assist from lift chair. Patient has demo inconsistency and will keep goal active.    Time 12    Period Weeks    Status On-going    Target Date 12/17/20      PT LONG TERM GOAL #5   Title The patient will demonstrate sitting without UE support for 2-5 minutes at EOB to improve participation and maximize independence with ADLs.    Baseline 10/22/19: Pt able to sit unsupported for at least 2 minutes at edge of bed. 04/21/2020- Patient continues to demo good sitting at edge of mat > 5 miin without difficulty or back support.    Time 12    Period Weeks    Status Achieved      Additional Long Term Goals   Additional Long Term Goals Yes      PT LONG TERM GOAL #6   Title Patient will demonstrate improved functional LE strength as seen by consistent ability to stand > 2 min (3 out of 4 trials) with Mod/Max A for improved LE strength with transfers.    Baseline 05/28/2020- Patient able to inconsistent stand 1-2 min right now in  //bars with Mod/Max A. 07/02/2020- Patient was able to progress  last 2 visit to standing using bilateral platform attachment between 1-2 min which is a progression from the parallel bars. 08/20/2020- Patient performed static standing with min- mod A using bilateral platform attachment on walker for 6 min 30 sec today with assist for trunk control and intermittent verbal cues for posture. 09/24/2020- Patient has consistently been able to stand >2 min in // bars and when using bilateral Platform walker.    Time 12    Period Weeks    Status Achieved      PT LONG TERM GOAL #7   Title Patient/caregiver will demonstrate assisted transfer using [place] consistently Independently for improved ability to transfer at home from lift chair to bed.    Baseline 09/24/2020- Patient requires hoyer lift (Dependent) for safe transfers.    Time 12    Period Weeks    Status New    Target Date 12/17/20                   Plan - 11/05/20 1259     Clinical Impression Statement Patient able to improve her ability to perform bridging well today with less physical assistance. She was fatigued at end of session reporting tired but no soreness or pain. She is requiring less physical assist with Right LE activities. Patient will continue to benefit from continued PT services to increase mobility and strength to improve patient's quality of life    Personal Factors and Comorbidities Age;Time since onset of injury/illness/exacerbation;Comorbidity 3+;Fitness    Comorbidities HTN, quadriparesis, history of DVT, neurogenic bladder    Examination-Activity Limitations Bathing;Hygiene/Grooming;Squat;Bed Mobility;Lift;Bend;Stand;Engineer, manufacturing;Toileting;Self Feeding;Transfers;Continence;Sit;Dressing;Sleep;Carry    Examination-Participation Restrictions Church;Laundry;Cleaning;Medication Management;Community Activity;Meal Prep;Interpersonal Relationship    Stability/Clinical Decision Making Evolving/Moderate  complexity    Rehab Potential Fair    PT Frequency 2x / week    PT Duration 12 weeks    PT Treatment/Interventions ADLs/Self Care Home Management;Electrical Stimulation;Therapeutic activities;Wheelchair mobility training;Vasopneumatic Device;Joint Manipulations;Vestibular;Passive range of motion;Patient/family education;Therapeutic exercise;DME Instruction;Biofeedback;Aquatic Therapy;Moist Heat;Gait training;Balance training;Orthotic Fit/Training;Dry needling;Energy conservation;Taping;Splinting;Neuromuscular re-education;Cryotherapy;Ultrasound;Functional mobility training    PT Next Visit Plan Continue with progressive sanding/transfer training.    PT Home Exercise Plan no changes    Consulted and Agree with Plan of Care Patient             Patient will benefit from skilled therapeutic intervention in order to improve the following deficits and impairments:  Abnormal gait, Decreased balance, Decreased endurance, Decreased mobility, Difficulty walking, Hypomobility, Impaired sensation, Decreased range of motion, Improper body mechanics, Impaired perceived functional ability, Decreased activity tolerance, Decreased knowledge of use of DME, Decreased safety awareness, Decreased strength, Impaired flexibility, Impaired UE functional use, Postural dysfunction  Visit Diagnosis: Abnormality of gait and mobility  Difficulty in walking, not elsewhere classified  Muscle weakness (generalized)  Unsteadiness on feet     Problem List Patient Active Problem List   Diagnosis Date Noted   Acute appendicitis with appendiceal abscess 05/30/2018   Hypocalcemia 02/15/2018   Dysuria 02/15/2018   Bilateral lower extremity edema 02/11/2018   Vaginal yeast infection 01/30/2018   Chest tightness 01/27/2018   Healthcare-associated pneumonia 01/25/2018   Pleural effusion, not elsewhere classified 01/25/2018   Acute deep vein thrombosis (DVT) of left lower extremity ([place]) 01/24/2018   Acute deep vein  thrombosis (DVT) of right lower extremity ([place]) 01/24/2018   Chronic allergic rhinitis 01/24/2018   Depression with anxiety 01/24/2018   UTI due to Klebsiella species 01/24/2018   Protein-calorie malnutrition, severe ([place]) 01/24/2018   S/P insertion of IVC (  inferior vena caval) filter 01/24/2018   Reactive depression    Hypokalemia    Leukocytosis    Essential hypertension    Trauma    Tetraparesis ([facility])    Neuropathic pain    Neurogenic bowel    Neurogenic bladder    Benign essential HTN    Acute blood loss anemia    Central cord syndrome at C6 level of cervical spinal cord ([place]) 11/29/2017   Central cord syndrome ([place]) 11/29/2017    Lewis Moccasin, PT 11/05/2020, 1:00 PM  [place] MAIN [facility] SERVICES [address] [place], [place], [address] Phone: [phone]   Fax:  [phone]  Name: Sherry [name] MRN: [id number] Date of Birth: 1934/09/06

## 2020-11-05 NOTE — Therapy (Signed)
[place] MAIN [facility] SERVICES [address] [place], [place], [address] Phone: [phone]   Fax:  [phone]  Occupational Therapy Treatment  Patient Details  Name: Sherry [name] MRN: [id number] Date of Birth: 1934/03/29 No data recorded  Encounter Date: 11/05/2020   OT End of Session - 11/05/20 1110     Visit Number 64    Number of Visits 96    Date for OT Re-Evaluation 01/12/21    Authorization Time Period Progress report period starting 09/08/2020    OT Start Time 1103    OT Stop Time 1145    OT Time Calculation (min) 42 min    Activity Tolerance Patient tolerated treatment well    Behavior During Therapy [facility] for tasks assessed/performed             Past Medical History:  Diagnosis Date   Acute blood loss anemia    Arthritis    Cancer ([place])    skin   Central cord syndrome at C6 level of cervical spinal cord ([place]) 11/29/2017   Hypertension    Protein-calorie malnutrition, severe ([place]) 01/24/2018   S/P insertion of IVC (inferior vena caval) filter 01/24/2018   Tetraparesis ([place])     Past Surgical History:  Procedure Laterality Date   ANTERIOR CERVICAL DECOMP/DISCECTOMY FUSION N/A 11/29/2017   Procedure: Cervical five-six, six-seven Anterior Cervical Decompression Fusion;  Surgeon: Judith Part, MD;  Location: [place];  Service: Neurosurgery;  Laterality: N/A;  Cervical five-six, six-seven Anterior Cervical Decompression Fusion   CATARACT EXTRACTION     IR IVC FILTER PLMT / S&I /IMG GUID/MOD SED  12/08/2017    There were no vitals filed for this visit.   Subjective Assessment - 11/05/20 1110     Subjective  Patient reports that he is doing well today    Patient is accompanied by: Family member    Pertinent History Patient s/p fall November 29, 2017 resulting in diagnosis of central cord syndrome at C 6 level.  She has had therapy in multiple venues but with recent move to [facility], therapy staff recommended she  seek outpatient therapy for her needs.    Currently in Pain? No/denies            OT TREATMENT     Pt. tolerated bilateral shoulder flexion, extension, abduction, elbow flexion, extension forearm supination AAROM/AROM. PROM bilateral wrist extension, digit MP, PIP, and DIP flexion, and extension, thumb radial, and palmar abduction in conjunction with moist heat. Pt. worked on [employer], and reciprocal motion using the UBE while sitting for 19mn. With minimal resistance. Constant monitoring was provided. Pt. worked on reaching with abduction reaching out to the far left, and the far right using the [employer] horizontal ring tower.  Patient worked reaching out for [employer] ring with the left UE, and passing it to the right then reaching out with the right to place them on the 2nd horizontal rung. Pt. was able to perform the task more freely today. Pt. worked on Bilateral UE reaching, and ROM in order to improve functional reaching for ADL, IADL tasks, and haircare.   Pt. continues to make progress overall, and is improving with BUE reaching in preparation for improving overhead ADLs, and haircare. Pt. continues to tolerate ROM, moist heat modality, and functional reaching without pain. Pt. was able  to reach out to the far left, and far right to move the [employer] rings  through the 2nd horizontal rungs of progressively increasing  heights with both the right, and left UEs. Pt. required less time to complete, and was able to reach further with no leaning noted today during reaching tasks. Pt. was able to tolerate the UBE for 8 min. With the UBE elevated for increased ROM. Pt. tolerated ROM well without reports of pain, or discomfort. Pt. continues to benefit from working on improving ROM in order to work towards increasing engagement of her bilateral hands during ADLs, and IADL tasks.                           OT Education - 11/05/20 1110     Education Details Bilateral upper  extremity range of motion with functional reaching to the far right and left    Person(s) Educated Patient;Caregiver(s)    Methods Explanation;Demonstration    Comprehension Verbalized understanding;Need further instruction                 OT Long Term Goals - 10/20/20 1302       OT LONG TERM GOAL #1   Title Patient and caregiver will demonstrate understanding of home exercise program for ROM.    Baseline Pt. continues to have a restorative aide rehab aide assist her wih exercises at [facility]. 8/8: Independently completes/directs HEP mulitple times/week    Time 12    Period Weeks    Status Partially Met    Target Date 01/12/21      OT LONG TERM GOAL #4   Title Patient will demonstrate improved composite finger flexion to hold deodorant to apply to underarms.    Baseline 10/20/2020: Pt. conitnues to make steady progress with bilateral digit flexion.Pt. conitnues to present with stiifness/tightness which is hindering pt.'s ability to formulate a full composite fist. Pt. continues to present with improving finger flexion of R hand for a partial gross grasp, but unable to grasp and hold items. pt. conitnues to present with limited digit flexion of L hand. Pt. has active left thumb abduction. 8/8: holds cup with 2 hands, continues to have limited composite finger flexion L worse than R    Time 12    Period Weeks    Status On-going    Target Date 01/12/21      OT LONG TERM GOAL #6   Title Patient will increase right UE ROM to comb the back of her hair with modified independence.    Baseline 10/20/2020: Pt. continues to progress wtith RRUE ROM, and is able to use a pic for the back of her head, however is unable to brush the back of her hair. 07/30/2020: Pt. is now able to comb the back of her hair with a pic, however continues to work on completing the task efficiently. 8/8: Achieves with great effort, has assistance for efficiancy    Time 12    Period Weeks    Status Partially Met     Target Date 01/12/21      OT LONG TERM GOAL #7   Title Pt. will independently, and efficiently send text messages on her phone.    Baseline 10/20/2020: Pt. is now able to type a text message on the phone, however not efficiently taking increased time to complete. 07/30/2020: Pt. continues to present with limited BUE strength, and [facility] skills. 8/8: independtly drafts text messages, continues to work on accuracy and efficiancy 2/2 limited [facility]    Time 12    Period Weeks    Status Partially Met  Target Date 01/12/21      OT LONG TERM GOAL #8   Title Pt. will write, and sign her name with 100% legibility, and modified independence.    Baseline 10/20/2020: Pt. continues to make progress overall. Pt. continues to consistently fill out her daily menu, and writing for crossword puzzles. Pt. is able to maintain grasp on a wide width pen. [place] to work on Media planner. 8/8: writes daily, continues to work on improving speed    Time 12    Period Weeks    Status Partially Met    Target Date 01/12/21                   Plan - 11/05/20 1111     Clinical Impression Statement Pt. continues to make progress overall, and is improving with BUE reaching in preparation for improving overhead ADLs, and haircare. Pt. continues to tolerate ROM, moist heat modality, and functional reaching without pain. Pt. was able  to reach out to the far left, and far right to move the [employer] rings  through the 2nd horizontal rungs of progressively increasing heights with both the right, and left UEs. Pt. required less time to complete, and was able to reach further with no leaning noted today during reaching tasks. Pt. was able to tolerate the UBE for 8 min. With the UBE elevated for increased ROM. Pt. tolerated ROM well without reports of pain, or discomfort. Pt. continues to benefit from working on improving ROM in order to work towards increasing engagement of her bilateral hands during ADLs, and IADL tasks.       OT Occupational Profile and History Detailed Assessment- Review of Records and additional review of physical, cognitive, psychosocial history related to current functional performance    Occupational performance deficits (Please refer to evaluation for details): ADL's;IADL's;Leisure;Social Participation    Body Structure / Function / Physical Skills ADL;Continence;Dexterity;Flexibility;Strength;ROM;Balance;Coordination;FMC;IADL;Endurance;UE functional use;Decreased knowledge of use of DME;GMC    Psychosocial Skills Coping Strategies    Rehab Potential Fair    Clinical Decision Making Several treatment options, min-mod task modification necessary    Comorbidities Affecting Occupational Performance: Presence of comorbidities impacting occupational performance    Comorbidities impacting occupational performance description: contractures of bilateral hands, dependent transfers,    Modification or Assistance to Complete Evaluation  No modification of tasks or assist necessary to complete eval    OT Frequency 2x / week    OT Duration 12 weeks    OT Treatment/Interventions Self-care/ADL training;Cryotherapy;Therapeutic exercise;DME and/or AE instruction;Balance training;Neuromuscular education;Manual Therapy;Splinting;Moist Heat;Passive range of motion;Therapeutic activities;Patient/family education    Consulted and Agree with Plan of Care Patient             Patient will benefit from skilled therapeutic intervention in order to improve the following deficits and impairments:   Body Structure / Function / Physical Skills: ADL, Continence, Dexterity, Flexibility, Strength, ROM, Balance, Coordination, FMC, IADL, Endurance, UE functional use, Decreased knowledge of use of DME, GMC   Psychosocial Skills: Coping Strategies   Visit Diagnosis: Muscle weakness (generalized)  Other lack of coordination    Problem List Patient Active Problem List   Diagnosis Date Noted   Acute appendicitis with  appendiceal abscess 05/30/2018   Hypocalcemia 02/15/2018   Dysuria 02/15/2018   Bilateral lower extremity edema 02/11/2018   Vaginal yeast infection 01/30/2018   Chest tightness 01/27/2018   Healthcare-associated pneumonia 01/25/2018   Pleural effusion, not elsewhere classified 01/25/2018   Acute deep vein thrombosis (DVT) of left lower  extremity ([place]) 01/24/2018   Acute deep vein thrombosis (DVT) of right lower extremity ([place]) 01/24/2018   Chronic allergic rhinitis 01/24/2018   Depression with anxiety 01/24/2018   UTI due to Klebsiella species 01/24/2018   Protein-calorie malnutrition, severe ([place]) 01/24/2018   S/P insertion of IVC (inferior vena caval) filter 01/24/2018   Reactive depression    Hypokalemia    Leukocytosis    Essential hypertension    Trauma    Tetraparesis (H[facility])    Neuropathic pain    Neurogenic bowel    Neurogenic bladder    Benign essential HTN    Acute blood loss anemia    Central cord syndrome at C6 level of cervical spinal cord ([place]) 11/29/2017   Central cord syndrome ([place]) 11/29/2017    Harrel Carina, MS, OTR/L 11/05/2020, 11:49 AM  [facility] [address] [place], [place], [address] Phone: [phone]   Fax:  [phone]  Name: Sherry [name] MRN: [id number] Date of Birth: 02/12/1934

## 2020-11-10 ENCOUNTER — Ambulatory Visit: Payer: Medicare HMO | Admitting: Occupational Therapy

## 2020-11-10 ENCOUNTER — Other Ambulatory Visit: Payer: Self-pay

## 2020-11-10 ENCOUNTER — Ambulatory Visit: Payer: Medicare HMO

## 2020-11-10 ENCOUNTER — Encounter: Payer: Self-pay | Admitting: Occupational Therapy

## 2020-11-10 DIAGNOSIS — R269 Unspecified abnormalities of gait and mobility: Secondary | ICD-10-CM

## 2020-11-10 DIAGNOSIS — M6281 Muscle weakness (generalized): Secondary | ICD-10-CM | POA: Diagnosis not present

## 2020-11-10 DIAGNOSIS — R262 Difficulty in walking, not elsewhere classified: Secondary | ICD-10-CM

## 2020-11-10 DIAGNOSIS — R2681 Unsteadiness on feet: Secondary | ICD-10-CM

## 2020-11-10 NOTE — Therapy (Signed)
[place] MAIN [facility] SERVICES [address] [place], [place], [address] Phone: [phone]   Fax:  [phone]  Occupational Therapy Treatment  Patient Details  Name: Sherry [name] MRN: [id number] Date of Birth: 1934/07/30 No data recorded  Encounter Date: 11/10/2020   OT End of Session - 11/10/20 1438     Visit Number 76    Number of Visits 96    Date for OT Re-Evaluation 01/12/21    Authorization Time Period Progress report period starting 09/08/2020    OT Start Time 1107    OT Stop Time 1145    OT Time Calculation (min) 38 min    Activity Tolerance Patient tolerated treatment well    Behavior During Therapy [facility] for tasks assessed/performed             Past Medical History:  Diagnosis Date   Acute blood loss anemia    Arthritis    Cancer ([place])    skin   Central cord syndrome at C6 level of cervical spinal cord ([place]) 11/29/2017   Hypertension    Protein-calorie malnutrition, severe ([place]) 01/24/2018   S/P insertion of IVC (inferior vena caval) filter 01/24/2018   Tetraparesis ([place])     Past Surgical History:  Procedure Laterality Date   ANTERIOR CERVICAL DECOMP/DISCECTOMY FUSION N/A 11/29/2017   Procedure: Cervical five-six, six-seven Anterior Cervical Decompression Fusion;  Surgeon: Judith Part, MD;  Location: [place];  Service: Neurosurgery;  Laterality: N/A;  Cervical five-six, six-seven Anterior Cervical Decompression Fusion   CATARACT EXTRACTION     IR IVC FILTER PLMT / S&I /IMG GUID/MOD SED  12/08/2017    There were no vitals filed for this visit.   Subjective Assessment - 11/10/20 1437     Subjective  Patient reports that he is doing well today    Patient is accompanied by: Family member    Pertinent History Patient s/p fall November 29, 2017 resulting in diagnosis of central cord syndrome at C 6 level.  She has had therapy in multiple venues but with recent move to [facility], therapy staff recommended she  seek outpatient therapy for her needs.    Currently in Pain? No/denies            OT TREATMENT     Pt. tolerated bilateral shoulder flexion, extension, abduction, elbow flexion, extension forearm supination AAROM/AROM. PROM bilateral wrist extension, digit MP, PIP, and DIP flexion, and extension, thumb radial, and palmar abduction in conjunction with moist heat. Pt. worked on [employer], and reciprocal motion using the UBE while sitting for 36mn. with minimal resistance. Constant monitoring was provided. Pt. Worked on functional reaching with her bilateral UEs for cones while crossing midline to place them onto an elevated surface. Pt. worked on Bilateral UE reaching, and ROM in order to improve functional reaching for ADL, IADL tasks, and haircare.   Pt. continues to make progress overall, and is improving with BUE reaching in preparation for improving overhead ADLs, and haircare. Pt. continues to tolerate ROM, moist heat modality, and functional reaching without pain. Pt. Was able to reach higher to place the items onto an elevated surface after crossing midline. Pt. was able to tolerate the UBE for 8 min. With the UBE elevated for increased ROM. Pt. tolerated ROM well without reports of pain, or discomfort. Pt. continues to benefit from working on improving ROM in order to work towards increasing engagement of her bilateral hands during ADLs, and IADL tasks.  OT Education - 11/10/20 1438     Education Details Bilateral upper extremity range of motion with functional reaching to the far right and left    Person(s) Educated Patient;Caregiver(s)    Methods Explanation;Demonstration    Comprehension Verbalized understanding;Need further instruction                 OT Long Term Goals - 10/20/20 1302       OT LONG TERM GOAL #1   Title Patient and caregiver will demonstrate understanding of home exercise program for ROM.    Baseline Pt.  continues to have a restorative aide rehab aide assist her wih exercises at [facility]. 8/8: Independently completes/directs HEP mulitple times/week    Time 12    Period Weeks    Status Partially Met    Target Date 01/12/21      OT LONG TERM GOAL #4   Title Patient will demonstrate improved composite finger flexion to hold deodorant to apply to underarms.    Baseline 10/20/2020: Pt. conitnues to make steady progress with bilateral digit flexion.Pt. conitnues to present with stiifness/tightness which is hindering pt.'s ability to formulate a full composite fist. Pt. continues to present with improving finger flexion of R hand for a partial gross grasp, but unable to grasp and hold items. pt. conitnues to present with limited digit flexion of L hand. Pt. has active left thumb abduction. 8/8: holds cup with 2 hands, continues to have limited composite finger flexion L worse than R    Time 12    Period Weeks    Status On-going    Target Date 01/12/21      OT LONG TERM GOAL #6   Title Patient will increase right UE ROM to comb the back of her hair with modified independence.    Baseline 10/20/2020: Pt. continues to progress wtith RRUE ROM, and is able to use a pic for the back of her head, however is unable to brush the back of her hair. 07/30/2020: Pt. is now able to comb the back of her hair with a pic, however continues to work on completing the task efficiently. 8/8: Achieves with great effort, has assistance for efficiancy    Time 12    Period Weeks    Status Partially Met    Target Date 01/12/21      OT LONG TERM GOAL #7   Title Pt. will independently, and efficiently send text messages on her phone.    Baseline 10/20/2020: Pt. is now able to type a text message on the phone, however not efficiently taking increased time to complete. 07/30/2020: Pt. continues to present with limited BUE strength, and [facility] skills. 8/8: independtly drafts text messages, continues to work on accuracy and efficiancy  2/2 limited [facility]    Time 12    Period Weeks    Status Partially Met    Target Date 01/12/21      OT LONG TERM GOAL #8   Title Pt. will write, and sign her name with 100% legibility, and modified independence.    Baseline 10/20/2020: Pt. continues to make progress overall. Pt. continues to consistently fill out her daily menu, and writing for crossword puzzles. Pt. is able to maintain grasp on a wide width pen. [place] to work on [employer]. 8/8: writes daily, continues to work on improving speed    Time 12    Period Weeks    Status Partially Met    Target Date 01/12/21  Plan - 11/10/20 1438     Clinical Impression Statement Pt. continues to make progress overall, and is improving with BUE reaching in preparation for improving overhead ADLs, and haircare. Pt. continues to tolerate ROM, moist heat modality, and functional reaching without pain. Pt. Was able to reach higher to place the items onto an elevated surface after crossing midline. Pt. was able to tolerate the UBE for 8 min. With the UBE elevated for increased ROM. Pt. tolerated ROM well without reports of pain, or discomfort. Pt. continues to benefit from working on improving ROM in order to work towards increasing engagement of her bilateral hands during ADLs, and IADL tasks.     OT Occupational Profile and History Detailed Assessment- Review of Records and additional review of physical, cognitive, psychosocial history related to current functional performance    Occupational performance deficits (Please refer to evaluation for details): ADL's;IADL's;Leisure;Social Participation    Body Structure / Function / Physical Skills ADL;Continence;Dexterity;Flexibility;Strength;ROM;Balance;Coordination;FMC;IADL;Endurance;UE functional use;Decreased knowledge of use of DME;GMC    Psychosocial Skills Coping Strategies    Rehab Potential Fair    Clinical Decision Making Several treatment options, min-mod task  modification necessary    Comorbidities Affecting Occupational Performance: Presence of comorbidities impacting occupational performance    Comorbidities impacting occupational performance description: contractures of bilateral hands, dependent transfers,    Modification or Assistance to Complete Evaluation  No modification of tasks or assist necessary to complete eval    OT Frequency 2x / week    OT Duration 12 weeks    OT Treatment/Interventions Self-care/ADL training;Cryotherapy;Therapeutic exercise;DME and/or AE instruction;Balance training;Neuromuscular education;Manual Therapy;Splinting;Moist Heat;Passive range of motion;Therapeutic activities;Patient/family education    Consulted and Agree with Plan of Care Patient             Patient will benefit from skilled therapeutic intervention in order to improve the following deficits and impairments:   Body Structure / Function / Physical Skills: ADL, Continence, Dexterity, Flexibility, Strength, ROM, Balance, Coordination, FMC, IADL, Endurance, UE functional use, Decreased knowledge of use of DME, GMC   Psychosocial Skills: Coping Strategies   Visit Diagnosis: Muscle weakness (generalized)    Problem List Patient Active Problem List   Diagnosis Date Noted   Acute appendicitis with appendiceal abscess 05/30/2018   Hypocalcemia 02/15/2018   Dysuria 02/15/2018   Bilateral lower extremity edema 02/11/2018   Vaginal yeast infection 01/30/2018   Chest tightness 01/27/2018   Healthcare-associated pneumonia 01/25/2018   Pleural effusion, not elsewhere classified 01/25/2018   Acute deep vein thrombosis (DVT) of left lower extremity ([place]) 01/24/2018   Acute deep vein thrombosis (DVT) of right lower extremity ([place]) 01/24/2018   Chronic allergic rhinitis 01/24/2018   Depression with anxiety 01/24/2018   UTI due to Klebsiella species 01/24/2018   Protein-calorie malnutrition, severe ([place]) 01/24/2018   S/P insertion of IVC (inferior  vena caval) filter 01/24/2018   Reactive depression    Hypokalemia    Leukocytosis    Essential hypertension    Trauma    Tetraparesis (HCC)    Neuropathic pain    Neurogenic bowel    Neurogenic bladder    Benign essential HTN    Acute blood loss anemia    Central cord syndrome at C6 level of cervical spinal cord ([place]) 11/29/2017   Central cord syndrome ([place]) 11/29/2017    Harrel Carina, MS, OTR/L 11/10/2020, 3:23 PM  [place] MAIN [facility] SERVICES [address] [place], [place], [address] Phone: [phone]   Fax:  [phone]  Name: MARGERITE IMPASTATO MRN: [id number] Date of Birth: 12-13-1934

## 2020-11-11 NOTE — Therapy (Signed)
[place] [facility] [address] [place], [place], [address] Phone: [phone]   Fax:  [phone]  Physical Therapy Treatment/Physical Therapy Progress Note   Dates of reporting period  10/01/2020   to   11/10/2020  Patient Details  Name: Sherry [name] MRN: [id number] Date of Birth: 1934/07/26 No data recorded  Encounter Date: 11/10/2020   PT End of Session - 11/10/20 1132     Visit Number 100    Number of Visits 104    Date for PT Re-Evaluation 12/17/20    Authorization Type [employer] FOTO performed by PT on eval (7/20), score 12, Progress note on 03/10/2020; PN on 08/20/2020    Authorization Time Period Recert 0/10/3233- 5/73/2202; PN on 06/04/2704; Recert 2/37/6283- 15/17/6160; PN on 10/01/2020    PT Start Time 1147    PT Stop Time 1217    PT Time Calculation (min) 30 min    Equipment Utilized During Treatment Gait belt    Activity Tolerance Patient tolerated treatment well;Patient limited by fatigue;Patient limited by pain    Behavior During Therapy WFL for tasks assessed/performed             Past Medical History:  Diagnosis Date   Acute blood loss anemia    Arthritis    Cancer ([place])    skin   Central cord syndrome at C6 level of cervical spinal cord ([place]) 11/29/2017   Hypertension    Protein-calorie malnutrition, severe ([place]) 01/24/2018   S/P insertion of IVC (inferior vena caval) filter 01/24/2018   Tetraparesis ([place])     Past Surgical History:  Procedure Laterality Date   ANTERIOR CERVICAL DECOMP/DISCECTOMY FUSION N/A 11/29/2017   Procedure: Cervical five-six, six-seven Anterior Cervical Decompression Fusion;  Surgeon: Judith Part, MD;  Location: [facility];  Service: Neurosurgery;  Laterality: N/A;  Cervical five-six, six-seven Anterior Cervical Decompression Fusion   CATARACT EXTRACTION     IR IVC FILTER PLMT / S&I /IMG GUID/MOD SED  12/08/2017    There were no vitals filed for this visit.    Subjective Assessment - 11/10/20 2259     Subjective Patient reports feeling "Blah" today. States she is okay with performing some exercises but does not feel like standing or walking.    Pertinent History Pt is an 86 yo female that fell in 2019, fractured vertebrae in her neck and in her low back per family. 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 After hospital stay, pt discharged to [facility] for ~1 month, experienced severe UTI as well as bilateral DVT (IVC filter placed). Discharged to [facility], stayed for about a year, and then moved to [facility] in April 2021. Was receiving PT, but per family facility reported that did not have adequate equipment to maximize PT for her. Pt until about 1 month ago was practicing sit to stand transfers with 1-2 people, and working on static standing. Has a brace for L foot due to PF. Pt currently needs assistance with all ADLs (able to assist with donning/doffing her shirt), bed baths, and needs a hoyer lift for transfers. Able to drive power wheelchair without assistance.    Currently in Pain? No/denies             INTERVENTIONS:   Therapeutic Exercises:    In reclined position- Single leg press with heavy manual resistance, 2 sets x15 each LE- Patient with some difficulty with terminal knee ext (more on right than left)   Bridging x 15  reps - Patient able to minimally raise bottom but unable to clear seat of chair.   Hip abduction with manual resistance with 3 sec hold B LE 2 sets of 15 reps  Quad sets BLE with 5 sec hold 2 sets of 10 reps  Seated LE - Hip march- AAROM 2 x 15 reps each Seated LE - Knee ext w/ 3 second hold - AROM on left and AAROM on right LE (more difficulty with achieving full ROM on right LE)   Education provided throughout session via VC/TC and demonstration to facilitate movement at target joints and correct muscle activation for all  exercises performed.   Clinical impression- Unable to assess mobility  goals for progress note due to patient requesting only exercising today due to not feeling well. Will assess all mobility goals- Transfers/standing next visit. Patient did report using the sit to stand lift at her home without difficulty.  Patient was able to participate well with overall strengthening in seated and reclined position without report of feeling any worse. She was well motivated to perform what she could today. Patient's condition has the potential to improve in response to therapy. Maximum improvement is yet to be obtained. The anticipated improvement is attainable and reasonable in a generally predictable time.                               PT Education - 11/11/20 1131     Education Details Exercise technique    Person(s) Educated Patient    Methods Explanation;Demonstration;Tactile cues;Verbal cues    Comprehension Verbalized understanding;Returned demonstration;Verbal cues required;Need further instruction              PT Short Term Goals - 03/10/20 2211       PT SHORT TERM GOAL #1   Title The patient will perform initial HEP with minimum assistance in order to improve strength and function.    Baseline Patient demonstrating independence with initial HEP as of 03/10/2020 with no questions or difficulty.    Time 4    Period Weeks    Status Achieved    Target Date 02/11/20               PT Long Term Goals - 09/24/20 1427       PT LONG TERM GOAL #1   Title The patient will be compliant with finalized HEP with minimum assistance in preparation for self management and maintenance of condition.    Baseline 03/10/2020- Patient familiar with initial HEP and will keep goal active as HEP becomes more progressive including possible transfers and standing activities. 04/07/2020- Patient continues to report compliance with LE strengthening home exercises without questions or concerns. 04/21/2020- Patient able to verbalize and demonstrate good  understanding of current HEP including some supine and seated LE strengthening exercises. Reviewed and patient performed 10 reps today with minimal cueing. Will keep goal active to progress HEP as appropriate. 05/28/2020- patient reports continues to perform LE strengthening as able and some help from caregiver as able. No changes at this time to home program as patient is limited to supine/seated therex. 07/02/2020- Patient continues to report compliance with current supine and seated LE strengtheing home program and states no questions or concerns regarding her plan.    Time 12    Period Weeks    Status Achieved      PT LONG TERM GOAL #2   Title The patient will demonstrate at least  10 point improvement on FOTO score indicating an improved ability to perform functional activities.    Baseline on eval (/720) score 12; 10/22/19: 12; 12/10/19: 18, 12/13: 18: 04/07/2020- Will assess next visit. 07/02/2020- Will obtain next visit. 09/24/2020= Will obtain next visit.    Time 12    Period Weeks    Status On-going    Target Date 12/17/20      PT LONG TERM GOAL #3   Title The patient will demonstrate lateral scooting  for assistance with transfers with minA to maximize independence.    Baseline 10/22/19: Pt requires modA+1 for lateral scooting; 12/10/19: modA+1, 12/13: modA-maxA, 12/27: modA-maxA, 03/10/2020- ModA-MaxA- increased verbal cues and visual demonstration. 04/07/2020- Continued Max assist to perform forward/lateral scoot. 04/21/2020- Patient was able to demo slight lateral scoot with CGA approx 12 in then fatigued requiring max assist- she is able to scoot forward by leaning without significant issues. 05/28/2020- While sitting in chair- patient able to perform forward and lateral scoot with VC/visual demo and increased time allowed- CGA today but not consistent yet- will keep goal active at this time to continue to focus on strengthening and improving technique. 07/02/2020- Patient able to demonstrate minimal  ability to laterally shift weigh on mat table requiring max assist yet is able to forward scoot out to edge of mat table with min Assist. 08/20/2020 - Patient was able to demonstrate minimal lateral scooting at edge of mat while holding feet still so they did not slip on tile surface. She continues to be able to demonstrate scoot forward and uses tilt option to recline to position safely back into seat of power wheelchair. 09/25/2020- Patient able to sit on mat and minimally scoot laterally but requires min/mod A for efficient scooting. Will end goal at this time due to plateau in progress with scooting over extended time.    Time 12    Period Weeks    Status Not Met    Target Date 12/17/20      PT LONG TERM GOAL #4   Title The patient will demonstrate a squat pivot transfer with minimum assistance to maximize independence and mobility.    Baseline 10/22/19: unable to safely attempt at this time; 12/10/19: unable to safely attempt at this time, 01/14/20: unable to safety attempt at this time. 03/10/2020- Patient able to perform Stand pivot transfer with Maximal assist. 04/07/2020- Patient continues to require Max assist with sit to stand- will assist SPT next visit. 04/21/2020- Patient presents with Max assist to perform Sit to stand Transfer- unable to pivot or move Left LE well without difficulty. 07/02/2020= Max assist with SPT and minimal ability to pivot. 7/202/2022-Patient able to stand with minimal assist this visit (as good as CGA last visit) and able to move right foot to pivot and performing with moderate assist today. 09/24/2020- Patient able to perform sit to stand with Min assist from lift chair. Patient has demo inconsistency and will keep goal active.    Time 12    Period Weeks    Status On-going    Target Date 12/17/20      PT LONG TERM GOAL #5   Title The patient will demonstrate sitting without UE support for 2-5 minutes at EOB to improve participation and maximize independence with ADLs.     Baseline 10/22/19: Pt able to sit unsupported for at least 2 minutes at edge of bed. 04/21/2020- Patient continues to demo good sitting at edge of mat > 5 miin without difficulty or back support.  Time 12    Period Weeks    Status Achieved      Additional Long Term Goals   Additional Long Term Goals Yes      PT LONG TERM GOAL #6   Title Patient will demonstrate improved functional LE strength as seen by consistent ability to stand > 2 min (3 out of 4 trials) with Mod/Max A for improved LE strength with transfers.    Baseline 05/28/2020- Patient able to inconsistent stand 1-2 min right now in //bars with Mod/Max A. 07/02/2020- Patient was able to progress last 2 visit to standing using bilateral platform attachment between 1-2 min which is a progression from the parallel bars. 08/20/2020- Patient performed static standing with min- mod A using bilateral platform attachment on walker for 6 min 30 sec today with assist for trunk control and intermittent verbal cues for posture. 09/24/2020- Patient has consistently been able to stand >2 min in // bars and when using bilateral Platform walker.    Time 12    Period Weeks    Status Achieved      PT LONG TERM GOAL #7   Title Patient/caregiver will demonstrate assisted transfer using [place] consistently Independently for improved ability to transfer at home from lift chair to bed.    Baseline 09/24/2020- Patient requires hoyer lift (Dependent) for safe transfers.    Time 12    Period Weeks    Status New    Target Date 12/17/20                   Plan - 11/11/20 1134     Clinical Impression Statement Unable to assess mobility goals for progress note due to patient requesting only exercising today due to not feeling well. Will assess all mobility goals- Transfers/standing next visit. Patient did report using the sit to stand lift at her home without difficulty.  Patient was able to participate well with overall strengthening in seated and  reclined position without report of feeling any worse. She was well motivated to perform what she could today. Patient's condition has the potential to improve in response to therapy. Maximum improvement is yet to be obtained. The anticipated improvement is attainable and reasonable in a generally predictable time.    Personal Factors and Comorbidities Age;Time since onset of injury/illness/exacerbation;Comorbidity 3+;Fitness    Comorbidities HTN, quadriparesis, history of DVT, neurogenic bladder    Examination-Activity Limitations Bathing;Hygiene/Grooming;Squat;Bed Mobility;Lift;Bend;Stand;Engineer, manufacturing;Toileting;Self Feeding;Transfers;Continence;Sit;Dressing;Sleep;Carry    Examination-Participation Restrictions Church;Laundry;Cleaning;Medication Management;Community Activity;Meal Prep;Interpersonal Relationship    Stability/Clinical Decision Making Evolving/Moderate complexity    Rehab Potential Fair    PT Frequency 2x / week    PT Duration 12 weeks    PT Treatment/Interventions ADLs/Self Care Home Management;Electrical Stimulation;Therapeutic activities;Wheelchair mobility training;Vasopneumatic Device;Joint Manipulations;Vestibular;Passive range of motion;Patient/family education;Therapeutic exercise;DME Instruction;Biofeedback;Aquatic Therapy;Moist Heat;Gait training;Balance training;Orthotic Fit/Training;Dry needling;Energy conservation;Taping;Splinting;Neuromuscular re-education;Cryotherapy;Ultrasound;Functional mobility training    PT Next Visit Plan Continue with progressive sanding/transfer training.    PT Home Exercise Plan no changes    Consulted and Agree with Plan of Care Patient             Patient will benefit from skilled therapeutic intervention in order to improve the following deficits and impairments:  Abnormal gait, Decreased balance, Decreased endurance, Decreased mobility, Difficulty walking, Hypomobility, Impaired sensation, Decreased range of motion,  Improper body mechanics, Impaired perceived functional ability, Decreased activity tolerance, Decreased knowledge of use of DME, Decreased safety awareness, Decreased strength, Impaired flexibility, Impaired UE functional use, Postural dysfunction  Visit Diagnosis:  Abnormality of gait and mobility  Difficulty in walking, not elsewhere classified  Muscle weakness (generalized)  Unsteadiness on feet     Problem List Patient Active Problem List   Diagnosis Date Noted   Acute appendicitis with appendiceal abscess 05/30/2018   Hypocalcemia 02/15/2018   Dysuria 02/15/2018   Bilateral lower extremity edema 02/11/2018   Vaginal yeast infection 01/30/2018   Chest tightness 01/27/2018   Healthcare-associated pneumonia 01/25/2018   Pleural effusion, not elsewhere classified 01/25/2018   Acute deep vein thrombosis (DVT) of left lower extremity ([place]) 01/24/2018   Acute deep vein thrombosis (DVT) of right lower extremity ([place]) 01/24/2018   Chronic allergic rhinitis 01/24/2018   Depression with anxiety 01/24/2018   UTI due to Klebsiella species 01/24/2018   Protein-calorie malnutrition, severe ([place]) 01/24/2018   S/P insertion of IVC (inferior vena caval) filter 01/24/2018   Reactive depression    Hypokalemia    Leukocytosis    Essential hypertension    Trauma    Tetraparesis ([facility])    Neuropathic pain    Neurogenic bowel    Neurogenic bladder    Benign essential HTN    Acute blood loss anemia    Central cord syndrome at C6 level of cervical spinal cord ([place]) 11/29/2017   Central cord syndrome ([place]) 11/29/2017    Lewis Moccasin, PT 11/11/2020, 11:50 AM  [place], [place], [address] Phone: [phone]   Fax:  [phone]  Name: Sherry [name] MRN: [id number] Date of Birth: [birth year]

## 2020-11-12 ENCOUNTER — Ambulatory Visit: Payer: Medicare HMO

## 2020-11-12 ENCOUNTER — Other Ambulatory Visit: Payer: Self-pay

## 2020-11-12 ENCOUNTER — Ambulatory Visit: Payer: Medicare HMO | Admitting: Occupational Therapy

## 2020-11-12 DIAGNOSIS — M6281 Muscle weakness (generalized): Secondary | ICD-10-CM

## 2020-11-12 DIAGNOSIS — R269 Unspecified abnormalities of gait and mobility: Secondary | ICD-10-CM

## 2020-11-12 DIAGNOSIS — R262 Difficulty in walking, not elsewhere classified: Secondary | ICD-10-CM

## 2020-11-12 DIAGNOSIS — R2681 Unsteadiness on feet: Secondary | ICD-10-CM

## 2020-11-12 DIAGNOSIS — R278 Other lack of coordination: Secondary | ICD-10-CM

## 2020-11-12 NOTE — Therapy (Signed)
[place] MAIN [facility] SERVICES [address] [place], [place], [address] Phone: [phone]   Fax:  [phone]  Physical Therapy Treatment  Patient Details  Name: Sherry [name] MRN: [id number] Date of Birth: Sep 29, 1934 No data recorded  Encounter Date: 11/12/2020   PT End of Session - 11/12/20 1147     Visit Number 101    Number of Visits 104    Date for PT Re-Evaluation 12/17/20    Authorization Type [employer] FOTO performed by PT on eval (7/20), score 12, Progress note on 03/10/2020; PN on 08/20/2020    Authorization Time Period Recert 08/08/4126- 7/86/7672; PN on 0/10/4707; Recert 07/29/3660- 94/76/5465; PN on 10/01/2020    PT Start Time 1145    PT Stop Time 1216    PT Time Calculation (min) 31 min    Equipment Utilized During Treatment Gait belt    Activity Tolerance Patient tolerated treatment well;Patient limited by fatigue;Patient limited by pain    Behavior During Therapy WFL for tasks assessed/performed             Past Medical History:  Diagnosis Date   Acute blood loss anemia    Arthritis    Cancer ([place])    skin   Central cord syndrome at C6 level of cervical spinal cord ([place]) 11/29/2017   Hypertension    Protein-calorie malnutrition, severe ([place]) 01/24/2018   S/P insertion of IVC (inferior vena caval) filter 01/24/2018   Tetraparesis ([place])     Past Surgical History:  Procedure Laterality Date   ANTERIOR CERVICAL DECOMP/DISCECTOMY FUSION N/A 11/29/2017   Procedure: Cervical five-six, six-seven Anterior Cervical Decompression Fusion;  Surgeon: Judith Part, MD;  Location: [facility];  Service: Neurosurgery;  Laterality: N/A;  Cervical five-six, six-seven Anterior Cervical Decompression Fusion   CATARACT EXTRACTION     IR IVC FILTER PLMT / S&I /IMG GUID/MOD SED  12/08/2017    There were no vitals filed for this visit.   Subjective Assessment - 11/12/20 1145     Subjective Patient reports feeling some better  today. Denies any pain or falls    Pertinent History Pt is an 86 yo female that fell in 2019, fractured vertebrae in her neck and in her low back per family. 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 After hospital stay, pt discharged to [facility] for ~1 month, experienced severe UTI as well as bilateral DVT (IVC filter placed). Discharged to [facility], stayed for about a year, and then moved to [facility] in April 2021. Was receiving PT, but per family facility reported that did not have adequate equipment to maximize PT for her. Pt until about 1 month ago was practicing sit to stand transfers with 1-2 people, and working on static standing. Has a brace for L foot due to PF. Pt currently needs assistance with all ADLs (able to assist with donning/doffing her shirt), bed baths, and needs a hoyer lift for transfers. Able to drive power wheelchair without assistance.    Currently in Pain? No/denies               INTERVENTIONS:   Therapeutic Exercises:   Hip abduction with manual resistance with 3 sec hold B LE 2 sets of 15 reps  Quad sets BLE with 5 sec hold 2 sets of 10 reps  Seated LE - Hip march- AAROM 2 x 15 reps each Seated LE - Knee ext w/ 3 second hold - AROM on left and AAROM on right LE (  more difficulty with achieving full ROM on right LE)   Static standing in // bars: Patient able to stand with max A today - able to attain center of mass quickly and engage glutes well. Patient performed static standing with initial CGA to min A and later in stand more mod assist for trunk control. She was able to stand for her personal best time of 8 min 3 sec today. Once completed she said that was awesome but "I am toast and need to stop for the day." Patient required verbal cues, Tactile for posture today. She was able self correct some today but did require intermittent cues to engage her glutes/quads.   Clinical Impression: Patient performed well overall despite limited visit.  She experienced her personal best single stand time yet- able to engage her quads and glutes well today with intermittent cues. She was also able to maintain her posture better today. She was very fatigued after standing and requested to end early due to fatigue. Patient will continue to benefit from continued PT services to increase mobility and strength to improve patient's quality of life                       PT Education - 11/12/20 1146     Education Details Exercise technique    Person(s) Educated Patient    Methods Explanation;Demonstration;Tactile cues;Verbal cues    Comprehension Verbalized understanding;Returned demonstration;Verbal cues required;Need further instruction              PT Short Term Goals - 03/10/20 2211       PT SHORT TERM GOAL #1   Title The patient will perform initial HEP with minimum assistance in order to improve strength and function.    Baseline Patient demonstrating independence with initial HEP as of 03/10/2020 with no questions or difficulty.    Time 4    Period Weeks    Status Achieved    Target Date 02/11/20               PT Long Term Goals - 11/12/20 1148       PT LONG TERM GOAL #1   Title The patient will be compliant with finalized HEP with minimum assistance in preparation for self management and maintenance of condition.    Baseline 03/10/2020- Patient familiar with initial HEP and will keep goal active as HEP becomes more progressive including possible transfers and standing activities. 04/07/2020- Patient continues to report compliance with LE strengthening home exercises without questions or concerns. 04/21/2020- Patient able to verbalize and demonstrate good understanding of current HEP including some supine and seated LE strengthening exercises. Reviewed and patient performed 10 reps today with minimal cueing. Will keep goal active to progress HEP as appropriate. 05/28/2020- patient reports continues to perform LE  strengthening as able and some help from caregiver as able. No changes at this time to home program as patient is limited to supine/seated therex. 07/02/2020- Patient continues to report compliance with current supine and seated LE strengtheing home program and states no questions or concerns regarding her plan.    Time 12    Period Weeks    Status Achieved      PT LONG TERM GOAL #2   Title The patient will demonstrate at least 10 point improvement on FOTO score indicating an improved ability to perform functional activities.    Baseline on eval (/720) score 12; 10/22/19: 12; 12/10/19: 18, 12/13: 18: 04/07/2020- Will assess next visit. 07/02/2020-  Will obtain next visit. 09/24/2020= Will obtain next visit. 11/12/2020= 12    Time 12    Period Weeks    Status On-going    Target Date 12/17/20      PT LONG TERM GOAL #3   Title The patient will demonstrate lateral scooting  for assistance with transfers with minA to maximize independence.    Baseline 10/22/19: Pt requires modA+1 for lateral scooting; 12/10/19: modA+1, 12/13: modA-maxA, 12/27: modA-maxA, 03/10/2020- ModA-MaxA- increased verbal cues and visual demonstration. 04/07/2020- Continued Max assist to perform forward/lateral scoot. 04/21/2020- Patient was able to demo slight lateral scoot with CGA approx 12 in then fatigued requiring max assist- she is able to scoot forward by leaning without significant issues. 05/28/2020- While sitting in chair- patient able to perform forward and lateral scoot with VC/visual demo and increased time allowed- CGA today but not consistent yet- will keep goal active at this time to continue to focus on strengthening and improving technique. 07/02/2020- Patient able to demonstrate minimal ability to laterally shift weigh on mat table requiring max assist yet is able to forward scoot out to edge of mat table with min Assist. 08/20/2020 - Patient was able to demonstrate minimal lateral scooting at edge of mat while holding feet still so  they did not slip on tile surface. She continues to be able to demonstrate scoot forward and uses tilt option to recline to position safely back into seat of power wheelchair. 09/25/2020- Patient able to sit on mat and minimally scoot laterally but requires min/mod A for efficient scooting. Will end goal at this time due to plateau in progress with scooting over extended time.    Time 12    Period Weeks    Status Not Met      PT LONG TERM GOAL #4   Title The patient will demonstrate a squat pivot transfer with minimum assistance to maximize independence and mobility.    Baseline 10/22/19: unable to safely attempt at this time; 12/10/19: unable to safely attempt at this time, 01/14/20: unable to safety attempt at this time. 03/10/2020- Patient able to perform Stand pivot transfer with Maximal assist. 04/07/2020- Patient continues to require Max assist with sit to stand- will assist SPT next visit. 04/21/2020- Patient presents with Max assist to perform Sit to stand Transfer- unable to pivot or move Left LE well without difficulty. 07/02/2020= Max assist with SPT and minimal ability to pivot. 7/202/2022-Patient able to stand with minimal assist this visit (as good as CGA last visit) and able to move right foot to pivot and performing with moderate assist today. 09/24/2020- Patient able to perform sit to stand with Min assist from lift chair. Patient has demo inconsistency and will keep goal active.    Time 12    Period Weeks    Status On-going      PT LONG TERM GOAL #5   Title The patient will demonstrate sitting without UE support for 2-5 minutes at EOB to improve participation and maximize independence with ADLs.    Baseline 10/22/19: Pt able to sit unsupported for at least 2 minutes at edge of bed. 04/21/2020- Patient continues to demo good sitting at edge of mat > 5 miin without difficulty or back support.    Time 12    Period Weeks    Status Achieved      PT LONG TERM GOAL #6   Title Patient will  demonstrate improved functional LE strength as seen by consistent ability to stand > 2 min (  3 out of 4 trials) with Mod/Max A for improved LE strength with transfers.    Baseline 05/28/2020- Patient able to inconsistent stand 1-2 min right now in //bars with Mod/Max A. 07/02/2020- Patient was able to progress last 2 visit to standing using bilateral platform attachment between 1-2 min which is a progression from the parallel bars. 08/20/2020- Patient performed static standing with min- mod A using bilateral platform attachment on walker for 6 min 30 sec today with assist for trunk control and intermittent verbal cues for posture. 09/24/2020- Patient has consistently been able to stand >2 min in // bars and when using bilateral Platform walker.    Time 12    Period Weeks    Status Achieved      PT LONG TERM GOAL #7   Title Patient/caregiver will demonstrate assisted transfer using [place] consistently Independently for improved ability to transfer at home from lift chair to bed.    Baseline 09/24/2020- Patient requires hoyer lift (Dependent) for safe transfers.    Time 12    Period Weeks    Status New                   Plan - 11/12/20 1147     Clinical Impression Statement Patient performed well overall despite limited visit. She experienced her personal best single stand time yet- able to engage her quads and glutes well today with intermittent cues. She was also able to maintain her posture better today. She was very fatigued after standing and requested to end early due to fatigue. Patient will continue to benefit from continued PT services to increase mobility and strength to improve patient's quality of life    Personal Factors and Comorbidities Age;Time since onset of injury/illness/exacerbation;Comorbidity 3+;Fitness    Comorbidities HTN, quadriparesis, history of DVT, neurogenic bladder    Examination-Activity Limitations Bathing;Hygiene/Grooming;Squat;Bed  Mobility;Lift;Bend;Stand;Engineer, manufacturing;Toileting;Self Feeding;Transfers;Continence;Sit;Dressing;Sleep;Carry    Examination-Participation Restrictions Church;Laundry;Cleaning;Medication Management;Community Activity;Meal Prep;Interpersonal Relationship    Stability/Clinical Decision Making Evolving/Moderate complexity    Rehab Potential Fair    PT Frequency 2x / week    PT Duration 12 weeks    PT Treatment/Interventions ADLs/Self Care Home Management;Electrical Stimulation;Therapeutic activities;Wheelchair mobility training;Vasopneumatic Device;Joint Manipulations;Vestibular;Passive range of motion;Patient/family education;Therapeutic exercise;DME Instruction;Biofeedback;Aquatic Therapy;Moist Heat;Gait training;Balance training;Orthotic Fit/Training;Dry needling;Energy conservation;Taping;Splinting;Neuromuscular re-education;Cryotherapy;Ultrasound;Functional mobility training    PT Next Visit Plan Continue with progressive sanding/transfer training.    PT Home Exercise Plan no changes    Consulted and Agree with Plan of Care Patient             Patient will benefit from skilled therapeutic intervention in order to improve the following deficits and impairments:  Abnormal gait, Decreased balance, Decreased endurance, Decreased mobility, Difficulty walking, Hypomobility, Impaired sensation, Decreased range of motion, Improper body mechanics, Impaired perceived functional ability, Decreased activity tolerance, Decreased knowledge of use of DME, Decreased safety awareness, Decreased strength, Impaired flexibility, Impaired UE functional use, Postural dysfunction  Visit Diagnosis: Abnormality of gait and mobility  Difficulty in walking, not elsewhere classified  Muscle weakness (generalized)  Unsteadiness on feet     Problem List Patient Active Problem List   Diagnosis Date Noted   Acute appendicitis with appendiceal abscess 05/30/2018   Hypocalcemia 02/15/2018    Dysuria 02/15/2018   Bilateral lower extremity edema 02/11/2018   Vaginal yeast infection 01/30/2018   Chest tightness 01/27/2018   Healthcare-associated pneumonia 01/25/2018   Pleural effusion, not elsewhere classified 01/25/2018   Acute deep vein thrombosis (DVT) of left lower extremity ([place]) 01/24/2018  Acute deep vein thrombosis (DVT) of right lower extremity ([facility]) 01/24/2018   Chronic allergic rhinitis 01/24/2018   Depression with anxiety 01/24/2018   UTI due to Klebsiella species 01/24/2018   Protein-calorie malnutrition, severe ([place]) 01/24/2018   S/P insertion of IVC (inferior vena caval) filter 01/24/2018   Reactive depression    Hypokalemia    Leukocytosis    Essential hypertension    Trauma    Tetraparesis ([facility])    Neuropathic pain    Neurogenic bowel    Neurogenic bladder    Benign essential HTN    Acute blood loss anemia    Central cord syndrome at C6 level of cervical spinal cord ([place]) 11/29/2017   Central cord syndrome ([place]) 11/29/2017    Lewis Moccasin, PT 11/13/2020, 10:13 AM  [place], [place], [address] Phone: [phone]   Fax:  [phone]  Name: KAYLEEN ALIG MRN: [id number] Date of Birth: August 14, 1934

## 2020-11-12 NOTE — Therapy (Signed)
[place] MAIN [facility] SERVICES [address] [place], [place], [address] Phone: [phone]   Fax:  [phone]  Occupational Therapy Treatment  Patient Details  Name: Sherry [name] MRN: [id number] Date of Birth: January 16, 1935 No data recorded  Encounter Date: 11/12/2020   OT End of Session - 11/12/20 1105     Visit Number 31    Number of Visits 96    Date for OT Re-Evaluation 01/12/21    Authorization Time Period Progress report period starting 09/08/2020    OT Start Time 1100    OT Stop Time 1145    OT Time Calculation (min) 45 min    Activity Tolerance Patient tolerated treatment well    Behavior During Therapy [facility] for tasks assessed/performed             Past Medical History:  Diagnosis Date   Acute blood loss anemia    Arthritis    Cancer ([place])    skin   Central cord syndrome at C6 level of cervical spinal cord ([place]) 11/29/2017   Hypertension    Protein-calorie malnutrition, severe ([place]) 01/24/2018   S/P insertion of IVC (inferior vena caval) filter 01/24/2018   Tetraparesis ([place])     Past Surgical History:  Procedure Laterality Date   ANTERIOR CERVICAL DECOMP/DISCECTOMY FUSION N/A 11/29/2017   Procedure: Cervical five-six, six-seven Anterior Cervical Decompression Fusion;  Surgeon: Judith Part, MD;  Location: [place];  Service: Neurosurgery;  Laterality: N/A;  Cervical five-six, six-seven Anterior Cervical Decompression Fusion   CATARACT EXTRACTION     IR IVC FILTER PLMT / S&I /IMG GUID/MOD SED  12/08/2017    There were no vitals filed for this visit.   Subjective Assessment - 11/12/20 1104     Subjective  Pt reports enjoying the cooler weather    Patient is accompanied by: Family member    Pertinent History Patient s/p fall November 29, 2017 resulting in diagnosis of central cord syndrome at C 6 level.  She has had therapy in multiple venues but with recent move to [facility], therapy staff recommended she seek  outpatient therapy for her needs.    Patient Stated Goals Patient would like to be able to move better in bed, stand and perform self care tasks.    Currently in Pain? No/denies    Pain Onset 1 to 4 weeks ago             Therapeutic Exercise Pt tolerated AROM bilateral shoulder flexion/extension/abduction, elbow flexion/ extension, and forearm supination using 1# wrist weight. PROM bilateral wrist extension, digit MP, PIP, and DIP flexion, and extension, thumb radial, and palmar abduction in conjunction with moist heat.  Pt. worked on reaching using the [employer]. Pt. Grasped and moved the shapes through first 3 vertical dowels of varying heights. Pt demonstrated difficulty crossing midline to place them onto highest dowel.         OT Education - 11/12/20 1105     Education Details Bilateral upper extremity range of motion with functional reaching to the far right and left    Person(s) Educated Patient;Caregiver(s)    Methods Explanation;Demonstration    Comprehension Verbalized understanding;Need further instruction                 OT Long Term Goals - 10/20/20 1302       OT LONG TERM GOAL #1   Title Patient and caregiver will demonstrate understanding of home exercise program for ROM.    Baseline  Pt. continues to have a restorative aide rehab aide assist her wih exercises at [facility]. 8/8: Independently completes/directs HEP mulitple times/week    Time 12    Period Weeks    Status Partially Met    Target Date 01/12/21      OT LONG TERM GOAL #4   Title Patient will demonstrate improved composite finger flexion to hold deodorant to apply to underarms.    Baseline 10/20/2020: Pt. conitnues to make steady progress with bilateral digit flexion.Pt. conitnues to present with stiifness/tightness which is hindering pt.'s ability to formulate a full composite fist. Pt. continues to present with improving finger flexion of R hand for a partial gross grasp, but unable to  grasp and hold items. pt. conitnues to present with limited digit flexion of L hand. Pt. has active left thumb abduction. 8/8: holds cup with 2 hands, continues to have limited composite finger flexion L worse than R    Time 12    Period Weeks    Status On-going    Target Date 01/12/21      OT LONG TERM GOAL #6   Title Patient will increase right UE ROM to comb the back of her hair with modified independence.    Baseline 10/20/2020: Pt. continues to progress wtith RRUE ROM, and is able to use a pic for the back of her head, however is unable to brush the back of her hair. 07/30/2020: Pt. is now able to comb the back of her hair with a pic, however continues to work on completing the task efficiently. 8/8: Achieves with great effort, has assistance for efficiancy    Time 12    Period Weeks    Status Partially Met    Target Date 01/12/21      OT LONG TERM GOAL #7   Title Pt. will independently, and efficiently send text messages on her phone.    Baseline 10/20/2020: Pt. is now able to type a text message on the phone, however not efficiently taking increased time to complete. 07/30/2020: Pt. continues to present with limited BUE strength, and [facility] skills. 8/8: independtly drafts text messages, continues to work on accuracy and efficiancy 2/2 limited [facility]    Time 12    Period Weeks    Status Partially Met    Target Date 01/12/21      OT LONG TERM GOAL #8   Title Pt. will write, and sign her name with 100% legibility, and modified independence.    Baseline 10/20/2020: Pt. continues to make progress overall. Pt. continues to consistently fill out her daily menu, and writing for crossword puzzles. Pt. is able to maintain grasp on a wide width pen. [place] to work on Media planner. 8/8: writes daily, continues to work on improving speed    Time 12    Period Weeks    Status Partially Met    Target Date 01/12/21                   Plan - 11/12/20 1105     Clinical Impression  Statement Pt. continues to make progress overall, and is improving with BUE reaching in preparation for improving overhead ADLs, and haircare. Pt. continues to tolerate ROM, moist heat modality, and functional reaching without pain. Pt tolerated demosntrates improved functional reach, reaching 3rd highest dowel with no assist, assist for highest dowel of shape tower only. Pt. continues to benefit from working on improving ROM in order to work towards increasing engagement of her bilateral  hands during ADLs, and IADL tasks.    OT Occupational Profile and History Detailed Assessment- Review of Records and additional review of physical, cognitive, psychosocial history related to current functional performance    Occupational performance deficits (Please refer to evaluation for details): ADL's;IADL's;Leisure;Social Participation    Body Structure / Function / Physical Skills ADL;Continence;Dexterity;Flexibility;Strength;ROM;Balance;Coordination;FMC;IADL;Endurance;UE functional use;Decreased knowledge of use of DME;GMC    Psychosocial Skills Coping Strategies    Rehab Potential Fair    Clinical Decision Making Several treatment options, min-mod task modification necessary    Comorbidities Affecting Occupational Performance: Presence of comorbidities impacting occupational performance    Comorbidities impacting occupational performance description: contractures of bilateral hands, dependent transfers,    Modification or Assistance to Complete Evaluation  No modification of tasks or assist necessary to complete eval    OT Frequency 2x / week    OT Duration 12 weeks    OT Treatment/Interventions Self-care/ADL training;Cryotherapy;Therapeutic exercise;DME and/or AE instruction;Balance training;Neuromuscular education;Manual Therapy;Splinting;Moist Heat;Passive range of motion;Therapeutic activities;Patient/family education    Consulted and Agree with Plan of Care Patient             Patient will benefit  from skilled therapeutic intervention in order to improve the following deficits and impairments:   Body Structure / Function / Physical Skills: ADL, Continence, Dexterity, Flexibility, Strength, ROM, Balance, Coordination, FMC, IADL, Endurance, UE functional use, Decreased knowledge of use of DME, GMC   Psychosocial Skills: Coping Strategies   Visit Diagnosis: Muscle weakness (generalized)  Other lack of coordination    Problem List Patient Active Problem List   Diagnosis Date Noted   Acute appendicitis with appendiceal abscess 05/30/2018   Hypocalcemia 02/15/2018   Dysuria 02/15/2018   Bilateral lower extremity edema 02/11/2018   Vaginal yeast infection 01/30/2018   Chest tightness 01/27/2018   Healthcare-associated pneumonia 01/25/2018   Pleural effusion, not elsewhere classified 01/25/2018   Acute deep vein thrombosis (DVT) of left lower extremity ([place]) 01/24/2018   Acute deep vein thrombosis (DVT) of right lower extremity ([place]) 01/24/2018   Chronic allergic rhinitis 01/24/2018   Depression with anxiety 01/24/2018   UTI due to Klebsiella species 01/24/2018   Protein-calorie malnutrition, severe ([place]) 01/24/2018   S/P insertion of IVC (inferior vena caval) filter 01/24/2018   Reactive depression    Hypokalemia    Leukocytosis    Essential hypertension    Trauma    Tetraparesis ([facility])    Neuropathic pain    Neurogenic bowel    Neurogenic bladder    Benign essential HTN    Acute blood loss anemia    Central cord syndrome at C6 level of cervical spinal cord ([place]) 11/29/2017   Central cord syndrome ([place]) 11/29/2017    Dessie Coma, M.S. OTR/L  11/12/20, 11:35 AM  ascom [phone]   [place] MAIN [facility] SERVICES [place], [place], [address] Phone: [phone]   Fax:  [phone]  Name: Sherry [name] MRN: [id number] Date of Birth: 1934-10-11

## 2020-11-17 ENCOUNTER — Ambulatory Visit: Payer: Medicare HMO | Admitting: Occupational Therapy

## 2020-11-17 ENCOUNTER — Ambulatory Visit: Payer: Medicare HMO

## 2020-11-19 ENCOUNTER — Ambulatory Visit: Payer: Medicare HMO | Admitting: Occupational Therapy

## 2020-11-19 ENCOUNTER — Other Ambulatory Visit: Payer: Self-pay

## 2020-11-19 ENCOUNTER — Ambulatory Visit: Payer: Medicare HMO

## 2020-11-19 DIAGNOSIS — R262 Difficulty in walking, not elsewhere classified: Secondary | ICD-10-CM

## 2020-11-19 DIAGNOSIS — M6281 Muscle weakness (generalized): Secondary | ICD-10-CM

## 2020-11-19 DIAGNOSIS — R278 Other lack of coordination: Secondary | ICD-10-CM

## 2020-11-19 DIAGNOSIS — R2681 Unsteadiness on feet: Secondary | ICD-10-CM

## 2020-11-19 DIAGNOSIS — R269 Unspecified abnormalities of gait and mobility: Secondary | ICD-10-CM

## 2020-11-20 ENCOUNTER — Encounter: Payer: Self-pay | Admitting: Occupational Therapy

## 2020-11-20 NOTE — Therapy (Signed)
[name] MAIN [facility] SERVICES [address] [place], [place], [address] Phone: [phone]   Fax:  [phone]  Occupational Therapy Treatment  Patient Details  Name: Sherry [name] MRN: [id number] Date of Birth: [birth year] No data recorded  Encounter Date: 11/19/2020   OT End of Session - 11/20/20 0849     Visit Number 63    Number of Visits 96    Date for OT Re-Evaluation 01/12/21    Authorization Time Period Progress report period starting 09/08/2020    OT Start Time 1100    OT Stop Time 1145    OT Time Calculation (min) 45 min    Activity Tolerance Patient tolerated treatment well    Behavior During Therapy [facility] for tasks assessed/performed             Past Medical History:  Diagnosis Date   Acute blood loss anemia    Arthritis    Cancer ([place])    skin   Central cord syndrome at C6 level of cervical spinal cord ([place]) 11/29/2017   Hypertension    Protein-calorie malnutrition, severe ([place]) 01/24/2018   S/P insertion of IVC (inferior vena caval) filter 01/24/2018   Tetraparesis ([place])     Past Surgical History:  Procedure Laterality Date   ANTERIOR CERVICAL DECOMP/DISCECTOMY FUSION N/A 11/29/2017   Procedure: Cervical five-six, six-seven Anterior Cervical Decompression Fusion;  Surgeon: Judith Part, MD;  Location: [place];  Service: Neurosurgery;  Laterality: N/A;  Cervical five-six, six-seven Anterior Cervical Decompression Fusion   CATARACT EXTRACTION     IR IVC FILTER PLMT / S&I /IMG GUID/MOD SED  12/08/2017    There were no vitals filed for this visit.   Subjective Assessment - 11/20/20 0847     Patient is accompanied by: Family member    Pertinent History Patient s/p fall November 29, 2017 resulting in diagnosis of central cord syndrome at C 6 level.  She has had therapy in multiple venues but with recent move to [facility], therapy staff recommended she seek outpatient therapy for her needs.    Patient Stated  Goals Patient would like to be able to move better in bed, stand and perform self care tasks.    Currently in Pain? No/denies           OT TREATMENT     Pt. tolerated bilateral shoulder flexion, extension, abduction, elbow flexion, extension forearm supination AAROM/AROM. PROM bilateral wrist extension, digit MP, PIP, and DIP flexion, and extension, thumb radial, and palmar abduction in conjunction with moist heat. Pt. worked on [employer], and reciprocal motion using the UBE while sitting for 59mn. with minimal resistance. Pt. worked on reaching using the [employer] Pt. grasped and moved the shapes through 2vertical dowels of varying heights with bilateral wrist cuff weights. Pt. worked on Bilateral UE reaching, and ROM in order to improve functional reaching for ADL, IADL tasks, and haircare.   Pt. reports that she has a slight fracture in her left foot after getting her foot caught between her w/c, and the sidewalk this past weekend. Pt. reports that she was advised not to weightbear through her left foot for 4 weeks. Pt. continues to make progress overall, and is improving with BUE reaching in preparation for improving overhead ADLs, and haircare. Pt. continues to tolerate ROM, moist heat modality, and functional reaching without pain. Pt. was able to reach higher to place the items onto an elevated surface after crossing midline. Pt. was able to tolerate  the UBE for 8 min. with the UBE elevated for increased ROM. Pt. tolerated ROM well without reports of pain, or discomfort. Pt. continues to benefit from working on improving ROM in order to work towards increasing engagement of her bilateral hands during ADLs, and IADL tasks.                                OT Education - 11/20/20 0849     Education Details Bilateral upper extremity range of motion with functional reaching to the far right and left    Person(s) Educated Patient;Caregiver(s)    Methods  Explanation;Demonstration    Comprehension Verbalized understanding;Need further instruction                 OT Long Term Goals - 10/20/20 1302       OT LONG TERM GOAL #1   Title Patient and caregiver will demonstrate understanding of home exercise program for ROM.    Baseline Pt. continues to have a restorative aide rehab aide assist her wih exercises at [facility]. 8/8: Independently completes/directs HEP mulitple times/week    Time 12    Period Weeks    Status Partially Met    Target Date 01/12/21      OT LONG TERM GOAL #4   Title Patient will demonstrate improved composite finger flexion to hold deodorant to apply to underarms.    Baseline 10/20/2020: Pt. conitnues to make steady progress with bilateral digit flexion.Pt. conitnues to present with stiifness/tightness which is hindering pt.'s ability to formulate a full composite fist. Pt. continues to present with improving finger flexion of R hand for a partial gross grasp, but unable to grasp and hold items. pt. conitnues to present with limited digit flexion of L hand. Pt. has active left thumb abduction. 8/8: holds cup with 2 hands, continues to have limited composite finger flexion L worse than R    Time 12    Period Weeks    Status On-going    Target Date 01/12/21      OT LONG TERM GOAL #6   Title Patient will increase right UE ROM to comb the back of her hair with modified independence.    Baseline 10/20/2020: Pt. continues to progress wtith RRUE ROM, and is able to use a pic for the back of her head, however is unable to brush the back of her hair. 07/30/2020: Pt. is now able to comb the back of her hair with a pic, however continues to work on completing the task efficiently. 8/8: Achieves with great effort, has assistance for efficiancy    Time 12    Period Weeks    Status Partially Met    Target Date 01/12/21      OT LONG TERM GOAL #7   Title Pt. will independently, and efficiently send text messages on her phone.     Baseline 10/20/2020: Pt. is now able to type a text message on the phone, however not efficiently taking increased time to complete. 07/30/2020: Pt. continues to present with limited BUE strength, and [facility] skills. 8/8: independtly drafts text messages, continues to work on accuracy and efficiancy 2/2 limited [facility]    Time 12    Period Weeks    Status Partially Met    Target Date 01/12/21      OT LONG TERM GOAL #8   Title Pt. will write, and sign her name with 100% legibility, and modified  independence.    Baseline 10/20/2020: Pt. continues to make progress overall. Pt. continues to consistently fill out her daily menu, and writing for crossword puzzles. Pt. is able to maintain grasp on a wide width pen. [place] to work on Media planner. 8/8: writes daily, continues to work on improving speed    Time 12    Period Weeks    Status Partially Met    Target Date 01/12/21                   Plan - 11/20/20 0854     Clinical Impression Statement Pt. reports that she has a slight fracture in her left foot after getting her foot caught between her w/c, and the sidewalk this past weekend. Pt. reports that she was advised not to weightbear through her left foot for 4 weeks. Pt. continues to make progress overall, and is improving with BUE reaching in preparation for improving overhead ADLs, and haircare. Pt. continues to tolerate ROM, moist heat modality, and functional reaching without pain. Pt. was able to reach higher to place the items onto an elevated surface after crossing midline. Pt. was able to tolerate the UBE for 8 min. with the UBE elevated for increased ROM. Pt. tolerated ROM well without reports of pain, or discomfort. Pt. continues to benefit from working on improving ROM in order to work towards increasing engagement of her bilateral hands during ADLs, and IADL tasks.        OT Occupational Profile and History Detailed Assessment- Review of Records and additional review of  physical, cognitive, psychosocial history related to current functional performance    Occupational performance deficits (Please refer to evaluation for details): ADL's;IADL's;Leisure;Social Participation    Body Structure / Function / Physical Skills ADL;Continence;Dexterity;Flexibility;Strength;ROM;Balance;Coordination;FMC;IADL;Endurance;UE functional use;Decreased knowledge of use of DME;GMC    Psychosocial Skills Coping Strategies    Rehab Potential Fair    Clinical Decision Making Several treatment options, min-mod task modification necessary    Comorbidities Affecting Occupational Performance: Presence of comorbidities impacting occupational performance    Comorbidities impacting occupational performance description: contractures of bilateral hands, dependent transfers,    Modification or Assistance to Complete Evaluation  No modification of tasks or assist necessary to complete eval    OT Frequency 2x / week    OT Duration 12 weeks    OT Treatment/Interventions Self-care/ADL training;Cryotherapy;Therapeutic exercise;DME and/or AE instruction;Balance training;Neuromuscular education;Manual Therapy;Splinting;Moist Heat;Passive range of motion;Therapeutic activities;Patient/family education    Consulted and Agree with Plan of Care Patient             Patient will benefit from skilled therapeutic intervention in order to improve the following deficits and impairments:   Body Structure / Function / Physical Skills: ADL, Continence, Dexterity, Flexibility, Strength, ROM, Balance, Coordination, FMC, IADL, Endurance, UE functional use, Decreased knowledge of use of DME, GMC   Psychosocial Skills: Coping Strategies   Visit Diagnosis: Muscle weakness (generalized)  Other lack of coordination    Problem List Patient Active Problem List   Diagnosis Date Noted   Acute appendicitis with appendiceal abscess 05/30/2018   Hypocalcemia 02/15/2018   Dysuria 02/15/2018   Bilateral lower  extremity edema 02/11/2018   Vaginal yeast infection 01/30/2018   Chest tightness 01/27/2018   Healthcare-associated pneumonia 01/25/2018   Pleural effusion, not elsewhere classified 01/25/2018   Acute deep vein thrombosis (DVT) of left lower extremity ([place]) 01/24/2018   Acute deep vein thrombosis (DVT) of right lower extremity ([place]) 01/24/2018   Chronic allergic rhinitis 01/24/2018  Depression with anxiety 01/24/2018   UTI due to Klebsiella species 01/24/2018   Protein-calorie malnutrition, severe ([place]) 01/24/2018   S/P insertion of IVC (inferior vena caval) filter 01/24/2018   Reactive depression    Hypokalemia    Leukocytosis    Essential hypertension    Trauma    Tetraparesis ([facility])    Neuropathic pain    Neurogenic bowel    Neurogenic bladder    Benign essential HTN    Acute blood loss anemia    Central cord syndrome at C6 level of cervical spinal cord ([place]) 11/29/2017   Central cord syndrome ([place]) 11/29/2017    Harrel Carina, MS, OTR/L 11/20/2020, 8:59 AM  [place], [place], [address] Phone: [phone]   Fax:  [phone]  Name: ALEKSIS JIGGETTS MRN: [id number] Date of Birth: 04/22/1934

## 2020-11-20 NOTE — Therapy (Signed)
[place] MAIN [facility] SERVICES [address] [place], [place], [address] Phone: [phone]   Fax:  [phone]  Physical Therapy Treatment  Patient Details  Name: Sherry [name] MRN: [id number] Date of Birth: 01/06/1935 No data recorded  Encounter Date: 11/19/2020   PT End of Session - 11/19/20 1526     Visit Number 102    Number of Visits 104    Date for PT Re-Evaluation 12/17/20    Authorization Type [employer] FOTO performed by PT on eval (7/20), score 12, Progress note on 03/10/2020; PN on 08/20/2020    Authorization Time Period Recert 02/06/6061- 0/16/0109; PN on 04/03/3555; Recert 04/22/252- 27/07/2374; PN on 10/01/2020    PT Start Time 1145    PT Stop Time 1225    PT Time Calculation (min) 40 min    Equipment Utilized During Treatment Gait belt    Activity Tolerance Patient tolerated treatment well;Patient limited by fatigue;Patient limited by pain    Behavior During Therapy WFL for tasks assessed/performed             Past Medical History:  Diagnosis Date   Acute blood loss anemia    Arthritis    Cancer ([place])    skin   Central cord syndrome at C6 level of cervical spinal cord ([place]) 11/29/2017   Hypertension    Protein-calorie malnutrition, severe ([place]) 01/24/2018   S/P insertion of IVC (inferior vena caval) filter 01/24/2018   Tetraparesis ([place])     Past Surgical History:  Procedure Laterality Date   ANTERIOR CERVICAL DECOMP/DISCECTOMY FUSION N/A 11/29/2017   Procedure: Cervical five-six, six-seven Anterior Cervical Decompression Fusion;  Surgeon: Judith Part, MD;  Location: [place];  Service: Neurosurgery;  Laterality: N/A;  Cervical five-six, six-seven Anterior Cervical Decompression Fusion   CATARACT EXTRACTION     IR IVC FILTER PLMT / S&I /IMG GUID/MOD SED  12/08/2017    There were no vitals filed for this visit.   Subjective Assessment - 11/19/20 1151     Subjective Patient reports injuring her left  ankle- foot rolled off foot plate while moving and foot was caughter between sidewalk and chair. Patient reports sustaining a lateral ankle fracture and was told no weight bearing for 4 weeks.    Pertinent History Pt is an 86 yo female that fell in 2019, fractured vertebrae in her neck and in her low back per family. 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 After hospital stay, pt discharged to [facility] for ~1 month, experienced severe UTI as well as bilateral DVT (IVC filter placed). Discharged to [facility], stayed for about a year, and then moved to [facility] in April 2021. Was receiving PT, but per family facility reported that did not have adequate equipment to maximize PT for her. Pt until about 1 month ago was practicing sit to stand transfers with 1-2 people, and working on static standing. Has a brace for L foot due to PF. Pt currently needs assistance with all ADLs (able to assist with donning/doffing her shirt), bed baths, and needs a hoyer lift for transfers. Able to drive power wheelchair without assistance.    Pain Score 2     Pain Location Ankle    Pain Orientation Left    Pain Descriptors / Indicators Sharp    Pain Type Acute pain    Pain Onset In the past 7 days    Pain Frequency Intermittent    Aggravating Factors  weight bearing  Pain Relieving Factors Rest              INTERVENTIONS:   *patient reports she is non-weight bearing for next 4 weeks due to reported fx Left ankle- Reports her left foot slipped off foot rest while mobilizing her electric chair on sidewalk and foot was caught between chair and sidewallk.   Therapeutic Exercises:    In reclined position-   Gluteal sets - Hold 5 sec x 10 reps x 2 sets      Hip abduction with min physical Assistance B LE 2 sets of 15 reps   Quad sets BLE with 5 sec hold 2 sets of 10 reps   Seated LE - Hip march- AAROM 2 x 15 reps each Seated LE - Knee ext w/ 3 second hold - AROM on left and AAROM on  right LE   Education provided throughout session via VC/TC and demonstration to facilitate movement at target joints and correct muscle activation for all  exercises performed.      Clinical Impression: Treatment limited to non-weight bearing activities today and LE strengthening. Patient able to complete all exercises without report of any left ankle pain. She is still limited by fatigue with exercises and requires some physical assist (more with right LE). Patient remains positive despite setback with left ankle. Patient will continue to benefit from continued PT services to increase mobility and strength to improve patient's quality of life                         PT Education - 11/20/20 1526     Education Details exercise technique    Person(s) Educated Patient    Methods Explanation;Demonstration;Tactile cues;Verbal cues    Comprehension Verbalized understanding;Returned demonstration;Verbal cues required;Need further instruction              PT Short Term Goals - 03/10/20 2211       PT SHORT TERM GOAL #1   Title The patient will perform initial HEP with minimum assistance in order to improve strength and function.    Baseline Patient demonstrating independence with initial HEP as of 03/10/2020 with no questions or difficulty.    Time 4    Period Weeks    Status Achieved    Target Date 02/11/20               PT Long Term Goals - 11/12/20 1148       PT LONG TERM GOAL #1   Title The patient will be compliant with finalized HEP with minimum assistance in preparation for self management and maintenance of condition.    Baseline 03/10/2020- Patient familiar with initial HEP and will keep goal active as HEP becomes more progressive including possible transfers and standing activities. 04/07/2020- Patient continues to report compliance with LE strengthening home exercises without questions or concerns. 04/21/2020- Patient able to verbalize and demonstrate  good understanding of current HEP including some supine and seated LE strengthening exercises. Reviewed and patient performed 10 reps today with minimal cueing. Will keep goal active to progress HEP as appropriate. 05/28/2020- patient reports continues to perform LE strengthening as able and some help from caregiver as able. No changes at this time to home program as patient is limited to supine/seated therex. 07/02/2020- Patient continues to report compliance with current supine and seated LE strengtheing home program and states no questions or concerns regarding her plan.    Time 12    Period Weeks  Status Achieved      PT LONG TERM GOAL #2   Title The patient will demonstrate at least 10 point improvement on FOTO score indicating an improved ability to perform functional activities.    Baseline on eval (/720) score 12; 10/22/19: 12; 12/10/19: 18, 12/13: 18: 04/07/2020- Will assess next visit. 07/02/2020- Will obtain next visit. 09/24/2020= Will obtain next visit. 11/12/2020= 12    Time 12    Period Weeks    Status On-going    Target Date 12/17/20      PT LONG TERM GOAL #3   Title The patient will demonstrate lateral scooting  for assistance with transfers with minA to maximize independence.    Baseline 10/22/19: Pt requires modA+1 for lateral scooting; 12/10/19: modA+1, 12/13: modA-maxA, 12/27: modA-maxA, 03/10/2020- ModA-MaxA- increased verbal cues and visual demonstration. 04/07/2020- Continued Max assist to perform forward/lateral scoot. 04/21/2020- Patient was able to demo slight lateral scoot with CGA approx 12 in then fatigued requiring max assist- she is able to scoot forward by leaning without significant issues. 05/28/2020- While sitting in chair- patient able to perform forward and lateral scoot with VC/visual demo and increased time allowed- CGA today but not consistent yet- will keep goal active at this time to continue to focus on strengthening and improving technique. 07/02/2020- Patient able to  demonstrate minimal ability to laterally shift weigh on mat table requiring max assist yet is able to forward scoot out to edge of mat table with min Assist. 08/20/2020 - Patient was able to demonstrate minimal lateral scooting at edge of mat while holding feet still so they did not slip on tile surface. She continues to be able to demonstrate scoot forward and uses tilt option to recline to position safely back into seat of power wheelchair. 09/25/2020- Patient able to sit on mat and minimally scoot laterally but requires min/mod A for efficient scooting. Will end goal at this time due to plateau in progress with scooting over extended time.    Time 12    Period Weeks    Status Not Met      PT LONG TERM GOAL #4   Title The patient will demonstrate a squat pivot transfer with minimum assistance to maximize independence and mobility.    Baseline 10/22/19: unable to safely attempt at this time; 12/10/19: unable to safely attempt at this time, 01/14/20: unable to safety attempt at this time. 03/10/2020- Patient able to perform Stand pivot transfer with Maximal assist. 04/07/2020- Patient continues to require Max assist with sit to stand- will assist SPT next visit. 04/21/2020- Patient presents with Max assist to perform Sit to stand Transfer- unable to pivot or move Left LE well without difficulty. 07/02/2020= Max assist with SPT and minimal ability to pivot. 7/202/2022-Patient able to stand with minimal assist this visit (as good as CGA last visit) and able to move right foot to pivot and performing with moderate assist today. 09/24/2020- Patient able to perform sit to stand with Min assist from lift chair. Patient has demo inconsistency and will keep goal active.    Time 12    Period Weeks    Status On-going      PT LONG TERM GOAL #5   Title The patient will demonstrate sitting without UE support for 2-5 minutes at EOB to improve participation and maximize independence with ADLs.    Baseline 10/22/19: Pt able to  sit unsupported for at least 2 minutes at edge of bed. 04/21/2020- Patient continues to demo good sitting at  edge of mat > 5 miin without difficulty or back support.    Time 12    Period Weeks    Status Achieved      PT LONG TERM GOAL #6   Title Patient will demonstrate improved functional LE strength as seen by consistent ability to stand > 2 min (3 out of 4 trials) with Mod/Max A for improved LE strength with transfers.    Baseline 05/28/2020- Patient able to inconsistent stand 1-2 min right now in //bars with Mod/Max A. 07/02/2020- Patient was able to progress last 2 visit to standing using bilateral platform attachment between 1-2 min which is a progression from the parallel bars. 08/20/2020- Patient performed static standing with min- mod A using bilateral platform attachment on walker for 6 min 30 sec today with assist for trunk control and intermittent verbal cues for posture. 09/24/2020- Patient has consistently been able to stand >2 min in // bars and when using bilateral Platform walker.    Time 12    Period Weeks    Status Achieved      PT LONG TERM GOAL #7   Title Patient/caregiver will demonstrate assisted transfer using [place] consistently Independently for improved ability to transfer at home from lift chair to bed.    Baseline 09/24/2020- Patient requires hoyer lift (Dependent) for safe transfers.    Time 12    Period Weeks    Status New                   Plan - 11/19/20 1525     Clinical Impression Statement Treatment limited to non-weight bearing activities today and LE strengthening. Patient able to complete all exercises without report of any left ankle pain. She is still limited by fatigue with exercises and requires some physical assist (more with right LE). Patient remains positive despite setback with left ankle. Patient will continue to benefit from continued PT services to increase mobility and strength to improve patient's quality of life    Personal Factors  and Comorbidities Age;Time since onset of injury/illness/exacerbation;Comorbidity 3+;Fitness    Comorbidities HTN, quadriparesis, history of DVT, neurogenic bladder    Examination-Activity Limitations Bathing;Hygiene/Grooming;Squat;Bed Mobility;Lift;Bend;Stand;Engineer, manufacturing;Toileting;Self Feeding;Transfers;Continence;Sit;Dressing;Sleep;Carry    Examination-Participation Restrictions Church;Laundry;Cleaning;Medication Management;Community Activity;Meal Prep;Interpersonal Relationship    Stability/Clinical Decision Making Evolving/Moderate complexity    Rehab Potential Fair    PT Frequency 2x / week    PT Duration 12 weeks    PT Treatment/Interventions ADLs/Self Care Home Management;Electrical Stimulation;Therapeutic activities;Wheelchair mobility training;Vasopneumatic Device;Joint Manipulations;Vestibular;Passive range of motion;Patient/family education;Therapeutic exercise;DME Instruction;Biofeedback;Aquatic Therapy;Moist Heat;Gait training;Balance training;Orthotic Fit/Training;Dry needling;Energy conservation;Taping;Splinting;Neuromuscular re-education;Cryotherapy;Ultrasound;Functional mobility training    PT Next Visit Plan Continue with progressive sanding/transfer training.    PT Home Exercise Plan no changes    Consulted and Agree with Plan of Care Patient             Patient will benefit from skilled therapeutic intervention in order to improve the following deficits and impairments:  Abnormal gait, Decreased balance, Decreased endurance, Decreased mobility, Difficulty walking, Hypomobility, Impaired sensation, Decreased range of motion, Improper body mechanics, Impaired perceived functional ability, Decreased activity tolerance, Decreased knowledge of use of DME, Decreased safety awareness, Decreased strength, Impaired flexibility, Impaired UE functional use, Postural dysfunction  Visit Diagnosis: Abnormality of gait and mobility  Difficulty in walking, not  elsewhere classified  Muscle weakness (generalized)  Unsteadiness on feet     Problem List Patient Active Problem List   Diagnosis Date Noted   Acute appendicitis with appendiceal abscess 05/30/2018  Hypocalcemia 02/15/2018   Dysuria 02/15/2018   Bilateral lower extremity edema 02/11/2018   Vaginal yeast infection 01/30/2018   Chest tightness 01/27/2018   Healthcare-associated pneumonia 01/25/2018   Pleural effusion, not elsewhere classified 01/25/2018   Acute deep vein thrombosis (DVT) of left lower extremity ([place]) 01/24/2018   Acute deep vein thrombosis (DVT) of right lower extremity ([place]) 01/24/2018   Chronic allergic rhinitis 01/24/2018   Depression with anxiety 01/24/2018   UTI due to Klebsiella species 01/24/2018   Protein-calorie malnutrition, severe ([place]) 01/24/2018   S/P insertion of IVC (inferior vena caval) filter 01/24/2018   Reactive depression    Hypokalemia    Leukocytosis    Essential hypertension    Trauma    Tetraparesis ([facility])    Neuropathic pain    Neurogenic bowel    Neurogenic bladder    Benign essential HTN    Acute blood loss anemia    Central cord syndrome at C6 level of cervical spinal cord ([place]) 11/29/2017   Central cord syndrome ([place]) 11/29/2017    Lewis Moccasin, PT 11/20/2020, 3:28 PM  [place] MAIN [facility] SERVICES [place], [place], [address] Phone: [phone]   Fax:  [phone]  Name: CHANEKA TREFZ MRN: [id number] Date of Birth: Aug 09, 1934

## 2020-11-21 DIAGNOSIS — B351 Tinea unguium: Secondary | ICD-10-CM | POA: Diagnosis not present

## 2020-11-21 DIAGNOSIS — M79672 Pain in left foot: Secondary | ICD-10-CM | POA: Diagnosis not present

## 2020-11-21 DIAGNOSIS — M79671 Pain in right foot: Secondary | ICD-10-CM | POA: Diagnosis not present

## 2020-11-24 ENCOUNTER — Other Ambulatory Visit: Payer: Self-pay

## 2020-11-24 ENCOUNTER — Encounter: Payer: Self-pay | Admitting: Occupational Therapy

## 2020-11-24 ENCOUNTER — Ambulatory Visit: Payer: Medicare HMO | Admitting: Occupational Therapy

## 2020-11-24 ENCOUNTER — Ambulatory Visit: Payer: Medicare HMO

## 2020-11-24 DIAGNOSIS — M6281 Muscle weakness (generalized): Secondary | ICD-10-CM

## 2020-11-24 DIAGNOSIS — R269 Unspecified abnormalities of gait and mobility: Secondary | ICD-10-CM

## 2020-11-24 DIAGNOSIS — R262 Difficulty in walking, not elsewhere classified: Secondary | ICD-10-CM

## 2020-11-24 DIAGNOSIS — R278 Other lack of coordination: Secondary | ICD-10-CM

## 2020-11-24 DIAGNOSIS — R2681 Unsteadiness on feet: Secondary | ICD-10-CM

## 2020-11-24 NOTE — Therapy (Signed)
[place] MAIN [facility] SERVICES [address] [place], [place], [address] Phone: [phone]   Fax:  [phone]  Physical Therapy Treatment  Patient Details  Name: Sherry [name] MRN: [id number] Date of Birth: 1934/03/06 No data recorded  Encounter Date: 11/24/2020   PT End of Session - 11/24/20 1252     Visit Number 103    Number of Visits 104    Date for PT Re-Evaluation 12/17/20    Authorization Type [employer]are FOTO performed by PT on eval (7/20), score 12, Progress note on 03/10/2020; PN on 08/20/2020    Authorization Time Period Recert 10/03/6382- 6/65/9935; PN on 7/0/1779; Recert 3/90/3009- 23/30/0762; PN on 10/01/2020    PT Start Time 1145    PT Stop Time 1225    PT Time Calculation (min) 40 min    Equipment Utilized During Treatment Gait belt    Activity Tolerance Patient tolerated treatment well;Patient limited by fatigue;Patient limited by pain    Behavior During Therapy WFL for tasks assessed/performed             Past Medical History:  Diagnosis Date   Acute blood loss anemia    Arthritis    Cancer ([place])    skin   Central cord syndrome at C6 level of cervical spinal cord ([place]) 11/29/2017   Hypertension    Protein-calorie malnutrition, severe ([place]) 01/24/2018   S/P insertion of IVC (inferior vena caval) filter 01/24/2018   Tetraparesis ([place])     Past Surgical History:  Procedure Laterality Date   ANTERIOR CERVICAL DECOMP/DISCECTOMY FUSION N/A 11/29/2017   Procedure: Cervical five-six, six-seven Anterior Cervical Decompression Fusion;  Surgeon: Judith Part, MD;  Location: [place];  Service: Neurosurgery;  Laterality: N/A;  Cervical five-six, six-seven Anterior Cervical Decompression Fusion   CATARACT EXTRACTION     IR IVC FILTER PLMT / S&I /IMG GUID/MOD SED  12/08/2017    There were no vitals filed for this visit.   Subjective Assessment - 11/24/20 1248     Subjective Patient reports feeling okay today-  denies any left ankle pain    Pertinent History Pt is an 86 yo female that fell in 2019, fractured vertebrae in her neck and in her low back per family. 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 After hospital stay, pt discharged to [facility] for ~1 month, experienced severe UTI as well as bilateral DVT (IVC filter placed). Discharged to [facility], stayed for about a year, and then moved to [facility] in April 2021. Was receiving PT, but per family facility reported that did not have adequate equipment to maximize PT for her. Pt until about 1 month ago was practicing sit to stand transfers with 1-2 people, and working on static standing. Has a brace for L foot due to PF. Pt currently needs assistance with all ADLs (able to assist with donning/doffing her shirt), bed baths, and needs a hoyer lift for transfers. Able to drive power wheelchair without assistance.    Currently in Pain? No/denies    Pain Onset In the past 7 days            Interventions:   Therapeutic exercises:   Gluteal sets - Hold 5 sec x 10 reps x 2 sets      Hip abduction with min physical Assistance B LE 2 sets of 15 reps   Quad sets BLE with 5 sec hold 2 sets of 10 reps   Seated LE - Hip march- AAROM 2  x 15 reps each Seated LE - Knee ext w/ 3 second hold - AROM on left and AAROM on right LE. VC to hold contraction today with slow eccentric control.    Education provided throughout session via VC/TC and demonstration to facilitate movement at target joints and correct muscle activation for all  exercises performed.    Clinical Impression: Patient remains limited with ability to participate and restricted to non-weight bearing activities. She was well motivated throughout session and responded well to [facility] for correct muscle contraction during seated and supine therex. She denied pain throughout session and able to participate well without difficulty. Patient will continue to benefit from continued PT services  to increase mobility and strength to improve patient's quality of life                          PT Education - 11/24/20 1251     Education Details Exercise technique    Person(s) Educated Patient    Methods Explanation;Demonstration;Tactile cues;Verbal cues;Handout    Comprehension Returned demonstration;Verbal cues required;Need further instruction;Tactile cues required;Verbalized understanding              PT Short Term Goals - 03/10/20 2211       PT SHORT TERM GOAL #1   Title The patient will perform initial HEP with minimum assistance in order to improve strength and function.    Baseline Patient demonstrating independence with initial HEP as of 03/10/2020 with no questions or difficulty.    Time 4    Period Weeks    Status Achieved    Target Date 02/11/20               PT Long Term Goals - 11/12/20 1148       PT LONG TERM GOAL #1   Title The patient will be compliant with finalized HEP with minimum assistance in preparation for self management and maintenance of condition.    Baseline 03/10/2020- Patient familiar with initial HEP and will keep goal active as HEP becomes more progressive including possible transfers and standing activities. 04/07/2020- Patient continues to report compliance with LE strengthening home exercises without questions or concerns. 04/21/2020- Patient able to verbalize and demonstrate good understanding of current HEP including some supine and seated LE strengthening exercises. Reviewed and patient performed 10 reps today with minimal cueing. Will keep goal active to progress HEP as appropriate. 05/28/2020- patient reports continues to perform LE strengthening as able and some help from caregiver as able. No changes at this time to home program as patient is limited to supine/seated therex. 07/02/2020- Patient continues to report compliance with current supine and seated LE strengtheing home program and states no questions or  concerns regarding her plan.    Time 12    Period Weeks    Status Achieved      PT LONG TERM GOAL #2   Title The patient will demonstrate at least 10 point improvement on FOTO score indicating an improved ability to perform functional activities.    Baseline on eval (/720) score 12; 10/22/19: 12; 12/10/19: 18, 12/13: 18: 04/07/2020- Will assess next visit. 07/02/2020- Will obtain next visit. 09/24/2020= Will obtain next visit. 11/12/2020= 12    Time 12    Period Weeks    Status On-going    Target Date 12/17/20      PT LONG TERM GOAL #3   Title The patient will demonstrate lateral scooting  for assistance with transfers with minA  to maximize independence.    Baseline 10/22/19: Pt requires modA+1 for lateral scooting; 12/10/19: modA+1, 12/13: modA-maxA, 12/27: modA-maxA, 03/10/2020- ModA-MaxA- increased verbal cues and visual demonstration. 04/07/2020- Continued Max assist to perform forward/lateral scoot. 04/21/2020- Patient was able to demo slight lateral scoot with CGA approx 12 in then fatigued requiring max assist- she is able to scoot forward by leaning without significant issues. 05/28/2020- While sitting in chair- patient able to perform forward and lateral scoot with VC/visual demo and increased time allowed- CGA today but not consistent yet- will keep goal active at this time to continue to focus on strengthening and improving technique. 07/02/2020- Patient able to demonstrate minimal ability to laterally shift weigh on mat table requiring max assist yet is able to forward scoot out to edge of mat table with min Assist. 08/20/2020 - Patient was able to demonstrate minimal lateral scooting at edge of mat while holding feet still so they did not slip on tile surface. She continues to be able to demonstrate scoot forward and uses tilt option to recline to position safely back into seat of power wheelchair. 09/25/2020- Patient able to sit on mat and minimally scoot laterally but requires min/mod A for efficient  scooting. Will end goal at this time due to plateau in progress with scooting over extended time.    Time 12    Period Weeks    Status Not Met      PT LONG TERM GOAL #4   Title The patient will demonstrate a squat pivot transfer with minimum assistance to maximize independence and mobility.    Baseline 10/22/19: unable to safely attempt at this time; 12/10/19: unable to safely attempt at this time, 01/14/20: unable to safety attempt at this time. 03/10/2020- Patient able to perform Stand pivot transfer with Maximal assist. 04/07/2020- Patient continues to require Max assist with sit to stand- will assist SPT next visit. 04/21/2020- Patient presents with Max assist to perform Sit to stand Transfer- unable to pivot or move Left LE well without difficulty. 07/02/2020= Max assist with SPT and minimal ability to pivot. 7/202/2022-Patient able to stand with minimal assist this visit (as good as CGA last visit) and able to move right foot to pivot and performing with moderate assist today. 09/24/2020- Patient able to perform sit to stand with Min assist from lift chair. Patient has demo inconsistency and will keep goal active.    Time 12    Period Weeks    Status On-going      PT LONG TERM GOAL #5   Title The patient will demonstrate sitting without UE support for 2-5 minutes at EOB to improve participation and maximize independence with ADLs.    Baseline 10/22/19: Pt able to sit unsupported for at least 2 minutes at edge of bed. 04/21/2020- Patient continues to demo good sitting at edge of mat > 5 miin without difficulty or back support.    Time 12    Period Weeks    Status Achieved      PT LONG TERM GOAL #6   Title Patient will demonstrate improved functional LE strength as seen by consistent ability to stand > 2 min (3 out of 4 trials) with Mod/Max A for improved LE strength with transfers.    Baseline 05/28/2020- Patient able to inconsistent stand 1-2 min right now in //bars with Mod/Max A. 07/02/2020- Patient  was able to progress last 2 visit to standing using bilateral platform attachment between 1-2 min which is a progression from the parallel  bars. 08/20/2020- Patient performed static standing with min- mod A using bilateral platform attachment on walker for 6 min 30 sec today with assist for trunk control and intermittent verbal cues for posture. 09/24/2020- Patient has consistently been able to stand >2 min in // bars and when using bilateral Platform walker.    Time 12    Period Weeks    Status Achieved      PT LONG TERM GOAL #7   Title Patient/caregiver will demonstrate assisted transfer using [place] consistently Independently for improved ability to transfer at home from lift chair to bed.    Baseline 09/24/2020- Patient requires hoyer lift (Dependent) for safe transfers.    Time 12    Period Weeks    Status New                   Plan - 11/24/20 1253     Clinical Impression Statement Patient remains limited with ability to participate and restricted to non-weight bearing activities. She was well motivated throughout session and responded well to [employer] for correct muscle contraction during seated and supine therex. She denied pain throughout session and able to participate well without difficulty. Patient will continue to benefit from continued PT services to increase mobility and strength to improve patient's quality of life    Personal Factors and Comorbidities Age;Time since onset of injury/illness/exacerbation;Comorbidity 3+;Fitness    Comorbidities HTN, quadriparesis, history of DVT, neurogenic bladder    Examination-Activity Limitations Bathing;Hygiene/Grooming;Squat;Bed Mobility;Lift;Bend;Stand;Engineer, manufacturing;Toileting;Self Feeding;Transfers;Continence;Sit;Dressing;Sleep;Carry    Examination-Participation Restrictions Church;Laundry;Cleaning;Medication Management;Community Activity;Meal Prep;Interpersonal Relationship    Stability/Clinical Decision Making  Evolving/Moderate complexity    Rehab Potential Fair    PT Frequency 2x / week    PT Duration 12 weeks    PT Treatment/Interventions ADLs/Self Care Home Management;Electrical Stimulation;Therapeutic activities;Wheelchair mobility training;Vasopneumatic Device;Joint Manipulations;Vestibular;Passive range of motion;Patient/family education;Therapeutic exercise;DME Instruction;Biofeedback;Aquatic Therapy;Moist Heat;Gait training;Balance training;Orthotic Fit/Training;Dry needling;Energy conservation;Taping;Splinting;Neuromuscular re-education;Cryotherapy;Ultrasound;Functional mobility training    PT Next Visit Plan Continue with progressive sanding/transfer training.    PT Home Exercise Plan no changes    Consulted and Agree with Plan of Care Patient             Patient will benefit from skilled therapeutic intervention in order to improve the following deficits and impairments:  Abnormal gait, Decreased balance, Decreased endurance, Decreased mobility, Difficulty walking, Hypomobility, Impaired sensation, Decreased range of motion, Improper body mechanics, Impaired perceived functional ability, Decreased activity tolerance, Decreased knowledge of use of DME, Decreased safety awareness, Decreased strength, Impaired flexibility, Impaired UE functional use, Postural dysfunction  Visit Diagnosis: Difficulty in walking, not elsewhere classified  Abnormality of gait and mobility  Muscle weakness (generalized)  Unsteadiness on feet     Problem List Patient Active Problem List   Diagnosis Date Noted   Acute appendicitis with appendiceal abscess 05/30/2018   Hypocalcemia 02/15/2018   Dysuria 02/15/2018   Bilateral lower extremity edema 02/11/2018   Vaginal yeast infection 01/30/2018   Chest tightness 01/27/2018   Healthcare-associated pneumonia 01/25/2018   Pleural effusion, not elsewhere classified 01/25/2018   Acute deep vein thrombosis (DVT) of left lower extremity ([place]) 01/24/2018    Acute deep vein thrombosis (DVT) of right lower extremity ([place]) 01/24/2018   Chronic allergic rhinitis 01/24/2018   Depression with anxiety 01/24/2018   UTI due to Klebsiella species 01/24/2018   Protein-calorie malnutrition, severe ([place]) 01/24/2018   S/P insertion of IVC (inferior vena caval) filter 01/24/2018   Reactive depression    Hypokalemia    Leukocytosis  Essential hypertension    Trauma    Tetraparesis ([facility])    Neuropathic pain    Neurogenic bowel    Neurogenic bladder    Benign essential HTN    Acute blood loss anemia    Central cord syndrome at C6 level of cervical spinal cord ([place]) 11/29/2017   Central cord syndrome ([place]) 11/29/2017    Lewis Moccasin, PT 11/24/2020, 1:01 PM  [place] MAIN [facility] SERVICES [address] [place], [place], [address] Phone: [phone]   Fax:  [phone]  Name: Sherry [name] MRN: [id number] Date of Birth: February 27, 1934

## 2020-11-24 NOTE — Therapy (Signed)
[place] MAIN [facility] SERVICES [address] [place], [place], [address] Phone: [phone]   Fax:  [phone]  Occupational Therapy Treatment  Patient Details  Name: Sherry [name] MRN: [id number] Date of Birth: 1934/07/24 No data recorded  Encounter Date: 11/24/2020   OT End of Session - 11/24/20 1102     Visit Number 17    Number of Visits 96    Date for OT Re-Evaluation 01/12/21    Authorization Time Period Progress report period starting 09/08/2020    OT Start Time 1100    OT Stop Time 1145    OT Time Calculation (min) 45 min    Activity Tolerance Patient tolerated treatment well    Behavior During Therapy [facility] for tasks assessed/performed             Past Medical History:  Diagnosis Date   Acute blood loss anemia    Arthritis    Cancer ([place])    skin   Central cord syndrome at C6 level of cervical spinal cord ([place]) 11/29/2017   Hypertension    Protein-calorie malnutrition, severe ([place]) 01/24/2018   S/P insertion of IVC (inferior vena caval) filter 01/24/2018   Tetraparesis ([place])     Past Surgical History:  Procedure Laterality Date   ANTERIOR CERVICAL DECOMP/DISCECTOMY FUSION N/A 11/29/2017   Procedure: Cervical five-six, six-seven Anterior Cervical Decompression Fusion;  Surgeon: Judith Part, MD;  Location: [place];  Service: Neurosurgery;  Laterality: N/A;  Cervical five-six, six-seven Anterior Cervical Decompression Fusion   CATARACT EXTRACTION     IR IVC FILTER PLMT / S&I /IMG GUID/MOD SED  12/08/2017    There were no vitals filed for this visit.   Subjective Assessment - 11/24/20 1101     Subjective  Pt reports enjoying the cooler weather    Patient is accompanied by: Family member    Pertinent History Patient s/p fall November 29, 2017 resulting in diagnosis of central cord syndrome at C 6 level.  She has had therapy in multiple venues but with recent move to [facility], therapy staff recommended she seek  outpatient therapy for her needs.    Patient Stated Goals Patient would like to be able to move better in bed, stand and perform self care tasks.    Currently in Pain? No/denies            OT TREATMENT     Pt. tolerated bilateral shoulder flexion, extension, abduction, elbow flexion, extension forearm supination AAROM/AROM. PROM bilateral wrist extension, digit MP, PIP, and DIP flexion, and extension, thumb radial, and palmar abduction in conjunction with moist heat. Pt. worked on [employer], and reciprocal motion using the UBE while sitting for 41mn. with minimal resistance. Constant monitoring was provided. Pt. worked on reaching with abduction reaching out to the far left, and the far right using the [employer] horizontal ring tower.  Patient worked reaching out for [employer] with the left UE, and passing it to the right then reaching out with the right to place them on the 2nd horizontal rung. Pt. was able to perform the task more freely today. Pt. worked on Bilateral UE reaching, and ROM in order to improve functional reaching for ADL, IADL tasks, and haircare.   Pt. reports that she has been trying to keep her LE's elevated to reduce swelling. Pt. continues to make progress overall, and is improving with BUE reaching in preparation for improving overhead ADLs, and haircare. Pt. continues to tolerate ROM, moist  heat modality, and functional reaching without pain. Pt. was able  to reach out to the far left, and far right to move the [employer] rings through the 2nd horizontal rungs of progressively increasing heights with both the right, and left UEs. Pt. required less time to complete, and was able to reach further with no leaning noted today during reaching tasks. Pt. was able to tolerate the UBE for 8 min. with the UBE elevated for increased ROM. Pt. tolerated ROM well without reports of pain, or discomfort. Pt. continues to benefit from working on improving ROM in order to work towards  increasing engagement of her bilateral hands during ADLs, and IADL tasks.                        OT Education - 11/24/20 1101     Education Details Bilateral upper extremity range of motion with functional reaching to the far right and left    Person(s) Educated Patient;Caregiver(s)    Methods Explanation;Demonstration    Comprehension Verbalized understanding;Need further instruction                 OT Long Term Goals - 10/20/20 1302       OT LONG TERM GOAL #1   Title Patient and caregiver will demonstrate understanding of home exercise program for ROM.    Baseline Pt. continues to have a restorative aide rehab aide assist her wih exercises at [facility]. 8/8: Independently completes/directs HEP mulitple times/week    Time 12    Period Weeks    Status Partially Met    Target Date 01/12/21      OT LONG TERM GOAL #4   Title Patient will demonstrate improved composite finger flexion to hold deodorant to apply to underarms.    Baseline 10/20/2020: Pt. conitnues to make steady progress with bilateral digit flexion.Pt. conitnues to present with stiifness/tightness which is hindering pt.'s ability to formulate a full composite fist. Pt. continues to present with improving finger flexion of R hand for a partial gross grasp, but unable to grasp and hold items. pt. conitnues to present with limited digit flexion of L hand. Pt. has active left thumb abduction. 8/8: holds cup with 2 hands, continues to have limited composite finger flexion L worse than R    Time 12    Period Weeks    Status On-going    Target Date 01/12/21      OT LONG TERM GOAL #6   Title Patient will increase right UE ROM to comb the back of her hair with modified independence.    Baseline 10/20/2020: Pt. continues to progress wtith RRUE ROM, and is able to use a pic for the back of her head, however is unable to brush the back of her hair. 07/30/2020: Pt. is now able to comb the back of her hair with a  pic, however continues to work on completing the task efficiently. 8/8: Achieves with great effort, has assistance for efficiancy    Time 12    Period Weeks    Status Partially Met    Target Date 01/12/21      OT LONG TERM GOAL #7   Title Pt. will independently, and efficiently send text messages on her phone.    Baseline 10/20/2020: Pt. is now able to type a text message on the phone, however not efficiently taking increased time to complete. 07/30/2020: Pt. continues to present with limited BUE strength, and [facility] skills. 8/8: independtly drafts  text messages, continues to work on accuracy and efficiancy 2/2 limited [facility]    Time 12    Period Weeks    Status Partially Met    Target Date 01/12/21      OT LONG TERM GOAL #8   Title Pt. will write, and sign her name with 100% legibility, and modified independence.    Baseline 10/20/2020: Pt. continues to make progress overall. Pt. continues to consistently fill out her daily menu, and writing for crossword puzzles. Pt. is able to maintain grasp on a wide width pen. [place] to work on Media planner. 8/8: writes daily, continues to work on improving speed    Time 12    Period Weeks    Status Partially Met    Target Date 01/12/21                   Plan - 11/24/20 1102     Clinical Impression Statement Pt. reports that she has been trying to keep her LE's elevated to reduce swelling. Pt. continues to make progress overall, and is improving with BUE reaching in preparation for improving overhead ADLs, and haircare. Pt. continues to tolerate ROM, moist heat modality, and functional reaching without pain. Pt. was able  to reach out to the far left, and far right to move the [employer] rings through the 2nd horizontal rungs of progressively increasing heights with both the right, and left UEs. Pt. required less time to complete, and was able to reach further with no leaning noted today during reaching tasks. Pt. was able to tolerate the UBE for 8  min. with the UBE elevated for increased ROM. Pt. tolerated ROM well without reports of pain, or discomfort. Pt. continues to benefit from working on improving ROM in order to work towards increasing engagement of her bilateral hands during ADLs, and IADL tasks.    OT Occupational Profile and History Detailed Assessment- Review of Records and additional review of physical, cognitive, psychosocial history related to current functional performance    Occupational performance deficits (Please refer to evaluation for details): ADL's;IADL's;Leisure;Social Participation    Body Structure / Function / Physical Skills ADL;Continence;Dexterity;Flexibility;Strength;ROM;Balance;Coordination;FMC;IADL;Endurance;UE functional use;Decreased knowledge of use of DME;GMC    Psychosocial Skills Coping Strategies    Rehab Potential Fair    Clinical Decision Making Several treatment options, min-mod task modification necessary    Comorbidities Affecting Occupational Performance: Presence of comorbidities impacting occupational performance    Modification or Assistance to Complete Evaluation  No modification of tasks or assist necessary to complete eval    OT Frequency 2x / week    OT Duration 12 weeks    OT Treatment/Interventions Self-care/ADL training;Cryotherapy;Therapeutic exercise;DME and/or AE instruction;Balance training;Neuromuscular education;Manual Therapy;Splinting;Moist Heat;Passive range of motion;Therapeutic activities;Patient/family education    Consulted and Agree with Plan of Care Patient             Patient will benefit from skilled therapeutic intervention in order to improve the following deficits and impairments:   Body Structure / Function / Physical Skills: ADL, Continence, Dexterity, Flexibility, Strength, ROM, Balance, Coordination, FMC, IADL, Endurance, UE functional use, Decreased knowledge of use of DME, GMC   Psychosocial Skills: Coping Strategies   Visit Diagnosis: Muscle weakness  (generalized)  Other lack of coordination    Problem List Patient Active Problem List   Diagnosis Date Noted   Acute appendicitis with appendiceal abscess 05/30/2018   Hypocalcemia 02/15/2018   Dysuria 02/15/2018   Bilateral lower extremity edema 02/11/2018   Vaginal yeast infection  01/30/2018   Chest tightness 01/27/2018   Healthcare-associated pneumonia 01/25/2018   Pleural effusion, not elsewhere classified 01/25/2018   Acute deep vein thrombosis (DVT) of left lower extremity ([place]) 01/24/2018   Acute deep vein thrombosis (DVT) of right lower extremity ([place]) 01/24/2018   Chronic allergic rhinitis 01/24/2018   Depression with anxiety 01/24/2018   UTI due to Klebsiella species 01/24/2018   Protein-calorie malnutrition, severe ([place]) 01/24/2018   S/P insertion of IVC (inferior vena caval) filter 01/24/2018   Reactive depression    Hypokalemia    Leukocytosis    Essential hypertension    Trauma    Tetraparesis (HCC)    Neuropathic pain    Neurogenic bowel    Neurogenic bladder    Benign essential HTN    Acute blood loss anemia    Central cord syndrome at C6 level of cervical spinal cord ([place]) 11/29/2017   Central cord syndrome ([place]) 11/29/2017    Harrel Carina, MS, OTR/L 11/24/2020, 11:05 AM  [place] [address] [place], [place], [address] Phone: [phone]   Fax:  [phone]  Name: Sherry [name] MRN: [id number] Date of Birth: November 17, 1934

## 2020-11-26 ENCOUNTER — Ambulatory Visit: Payer: Medicare HMO

## 2020-11-26 ENCOUNTER — Other Ambulatory Visit: Payer: Self-pay

## 2020-11-26 ENCOUNTER — Encounter: Payer: Self-pay | Admitting: Occupational Therapy

## 2020-11-26 ENCOUNTER — Ambulatory Visit: Payer: Medicare HMO | Admitting: Occupational Therapy

## 2020-11-26 DIAGNOSIS — R262 Difficulty in walking, not elsewhere classified: Secondary | ICD-10-CM

## 2020-11-26 DIAGNOSIS — R2681 Unsteadiness on feet: Secondary | ICD-10-CM

## 2020-11-26 DIAGNOSIS — R269 Unspecified abnormalities of gait and mobility: Secondary | ICD-10-CM

## 2020-11-26 DIAGNOSIS — M6281 Muscle weakness (generalized): Secondary | ICD-10-CM | POA: Diagnosis not present

## 2020-11-26 NOTE — Therapy (Signed)
[place] MAIN [facility] SERVICES [address] [place], [place], [address] Phone: [phone]   Fax:  [phone]  Occupational Therapy Progress Note                                                            Dates of reporting period  09/08/2020   to   11/26/2020  Patient Details  Name: Sherry [name] MRN: [id number] Date of Birth: 1934-12-16 No data recorded  Encounter Date: 11/26/2020   OT End of Session - 11/26/20 1112     Visit Number 2    Number of Visits 96    Date for OT Re-Evaluation 01/12/21    Authorization Time Period Progress report period starting 11/26/2020    OT Start Time 1100    OT Stop Time 1145    OT Time Calculation (min) 45 min    Equipment Utilized During Treatment moist heat    Activity Tolerance Patient tolerated treatment well    Behavior During Therapy [facility] for tasks assessed/performed             Past Medical History:  Diagnosis Date   Acute blood loss anemia    Arthritis    Cancer ([place])    skin   Central cord syndrome at C6 level of cervical spinal cord ([place]) 11/29/2017   Hypertension    Protein-calorie malnutrition, severe ([place]) 01/24/2018   S/P insertion of IVC (inferior vena caval) filter 01/24/2018   Tetraparesis ([place])     Past Surgical History:  Procedure Laterality Date   ANTERIOR CERVICAL DECOMP/DISCECTOMY FUSION N/A 11/29/2017   Procedure: Cervical five-six, six-seven Anterior Cervical Decompression Fusion;  Surgeon: Judith Part, MD;  Location: [place];  Service: Neurosurgery;  Laterality: N/A;  Cervical five-six, six-seven Anterior Cervical Decompression Fusion   CATARACT EXTRACTION     IR IVC FILTER PLMT / S&I /IMG GUID/MOD SED  12/08/2017    There were no vitals filed for this visit.   Subjective Assessment - 11/26/20 1349     Subjective  Pt. reports that her foot is feeling better.    Patient is accompanied by: Family member    Pertinent History Patient s/p fall November 29, 2017 resulting in diagnosis of central cord syndrome at C 6 level.  She has had therapy in multiple venues but with recent move to [facility], therapy staff recommended she seek outpatient therapy for her needs.    Patient Stated Goals Patient would like to be able to move better in bed, stand and perform self care tasks.    Currently in Pain? No/denies            OT TREATMENT     Pt. tolerated bilateral shoulder flexion, extension, abduction, elbow flexion, extension forearm supination AAROM/AROM. PROM bilateral wrist extension, digit MP, PIP, and DIP flexion, and extension, thumb radial, and palmar abduction in conjunction with moist heat. Pt. worked on [employer], and reciprocal motion using the UBE while sitting for 1mn. with minimal resistance. Constant monitoring was provided.  Pt. worked on Bilateral UE strengthening to improve UE functional reaching for ADLs, and IADL tasks, and haircare.   Pt. continues to keep her LE's elevated to reduce swelling. Pt. continues to make progress overall, and is improving with BUE reaching in preparation  for improving overhead ADLs, and haircare. Pt. continues to tolerate ROM, moist heat modality, and functional reaching without pain. Pt. continues to be able to tolerate the UBE for 8 min. with the UBE elevated for increased ROM. Pt. tolerated ROM well without reports of pain, or discomfort. Pt. continues to benefit from working on improving ROM in order to work towards increasing engagement of her bilateral hands during ADLs, and IADL tasks.                         OT Education - 11/26/20 1112     Education Details Bilateral upper extremity range of motion with functional reaching to the far right and left    Person(s) Educated Patient;Caregiver(s)    Methods Explanation;Demonstration    Comprehension Verbalized understanding;Need further instruction                 OT Long Term Goals - 10/20/20 1302       OT  LONG TERM GOAL #1   Title Patient and caregiver will demonstrate understanding of home exercise program for ROM.    Baseline Pt. continues to have a restorative aide rehab aide assist her wih exercises at [facility]. 8/8: Independently completes/directs HEP mulitple times/week    Time 12    Period Weeks    Status Partially Met    Target Date 01/12/21      OT LONG TERM GOAL #4   Title Patient will demonstrate improved composite finger flexion to hold deodorant to apply to underarms.    Baseline 10/20/2020: Pt. conitnues to make steady progress with bilateral digit flexion.Pt. conitnues to present with stiifness/tightness which is hindering pt.'s ability to formulate a full composite fist. Pt. continues to present with improving finger flexion of R hand for a partial gross grasp, but unable to grasp and hold items. pt. conitnues to present with limited digit flexion of L hand. Pt. has active left thumb abduction. 8/8: holds cup with 2 hands, continues to have limited composite finger flexion L worse than R    Time 12    Period Weeks    Status On-going    Target Date 01/12/21      OT LONG TERM GOAL #6   Title Patient will increase right UE ROM to comb the back of her hair with modified independence.    Baseline 10/20/2020: Pt. continues to progress wtith RRUE ROM, and is able to use a pic for the back of her head, however is unable to brush the back of her hair. 07/30/2020: Pt. is now able to comb the back of her hair with a pic, however continues to work on completing the task efficiently. 8/8: Achieves with great effort, has assistance for efficiancy    Time 12    Period Weeks    Status Partially Met    Target Date 01/12/21      OT LONG TERM GOAL #7   Title Pt. will independently, and efficiently send text messages on her phone.    Baseline 10/20/2020: Pt. is now able to type a text message on the phone, however not efficiently taking increased time to complete. 07/30/2020: Pt. continues to  present with limited BUE strength, and [facility] skills. 8/8: independtly drafts text messages, continues to work on accuracy and efficiancy 2/2 limited [facility]    Time 12    Period Weeks    Status Partially Met    Target Date 01/12/21      OT  LONG TERM GOAL #8   Title Pt. will write, and sign her name with 100% legibility, and modified independence.    Baseline 10/20/2020: Pt. continues to make progress overall. Pt. continues to consistently fill out her daily menu, and writing for crossword puzzles. Pt. is able to maintain grasp on a wide width pen. [place] to work on [employer] pl[employer]. 8/8: writes daily, continues to work on improving speed    Time 12    Period Weeks    Status Partially Met    Target Date 01/12/21                   Plan - 11/26/20 1113     Clinical Impression Statement Pt. continues to keep her LE's elevated to reduce swelling. Pt. continues to make progress overall, and is improving with BUE reaching in preparation for improving overhead ADLs, and haircare. Pt. continues to tolerate ROM, moist heat modality, and functional reaching without pain. Pt. continues to be able to tolerate the UBE for 8 min. with the UBE elevated for increased ROM. Pt. tolerated ROM well without reports of pain, or discomfort. Pt. continues to benefit from working on improving ROM in order to work towards increasing engagement of her bilateral hands during ADLs, and IADL tasks.    OT Occupational Profile and History Detailed Assessment- Review of Records and additional review of physical, cognitive, psychosocial history related to current functional performance    Occupational performance deficits (Please refer to evaluation for details): ADL's;IADL's;Leisure;Social Participation    Body Structure / Function / Physical Skills ADL;Continence;Dexterity;Flexibility;Strength;ROM;Balance;Coordination;FMC;IADL;Endurance;UE functional use;Decreased knowledge of use of DME;GMC    Psychosocial Skills  Coping Strategies    Rehab Potential Fair    Clinical Decision Making Several treatment options, min-mod task modification necessary    Comorbidities Affecting Occupational Performance: Presence of comorbidities impacting occupational performance    Comorbidities impacting occupational performance description: contractures of bilateral hands, dependent transfers,    Modification or Assistance to Complete Evaluation  No modification of tasks or assist necessary to complete eval    OT Frequency 2x / week    OT Duration 12 weeks    OT Treatment/Interventions Self-care/ADL training;Cryotherapy;Therapeutic exercise;DME and/or AE instruction;Balance training;Neuromuscular education;Manual Therapy;Splinting;Moist Heat;Passive range of motion;Therapeutic activities;Patient/family education    Plan continue to progress ROM of digits, wrists and shoulders as it pertains to completion of ADL tasks that pt values.    Consulted and Agree with Plan of Care Patient             Patient will benefit from skilled therapeutic intervention in order to improve the following deficits and impairments:   Body Structure / Function / Physical Skills: ADL, Continence, Dexterity, Flexibility, Strength, ROM, Balance, Coordination, FMC, IADL, Endurance, UE functional use, Decreased knowledge of use of DME, GMC   Psychosocial Skills: Coping Strategies   Visit Diagnosis: Muscle weakness (generalized)    Problem List Patient Active Problem List   Diagnosis Date Noted   Acute appendicitis with appendiceal abscess 05/30/2018   Hypocalcemia 02/15/2018   Dysuria 02/15/2018   Bilateral lower extremity edema 02/11/2018   Vaginal yeast infection 01/30/2018   Chest tightness 01/27/2018   Healthcare-associated pneumonia 01/25/2018   Pleural effusion, not elsewhere classified 01/25/2018   Acute deep vein thrombosis (DVT) of left lower extremity ([place]) 01/24/2018   Acute deep vein thrombosis (DVT) of right lower  extremity ([place]) 01/24/2018   Chronic allergic rhinitis 01/24/2018   Depression with anxiety 01/24/2018   UTI due to Klebsiella species 01/24/2018  Protein-calorie malnutrition, severe ([place]) 01/24/2018   S/P insertion of IVC (inferior vena caval) filter 01/24/2018   Reactive depression    Hypokalemia    Leukocytosis    Essential hypertension    Trauma    Tetraparesis (HCC)    Neuropathic pain    Neurogenic bowel    Neurogenic bladder    Benign essential HTN    Acute blood loss anemia    Central cord syndrome at C6 level of cervical spinal cord ([place]) 11/29/2017   Central cord syndrome ([place]) 11/29/2017    Harrel Carina, MS, OTR/L 11/26/2020, 1:50 PM  [place] MAIN [facility] SERVICES [address] [place], [place], [address] Phone: [phone]   Fax:  [phone]  Name: SERAPHINE GUDIEL MRN: [id number] Date of Birth: 08-Aug-1934

## 2020-11-26 NOTE — Therapy (Signed)
[place] MAIN [facility] SERVICES [address] [place], [place], [address] Phone: [phone]   Fax:  [phone]  Physical Therapy Treatment  Patient Details  Name: Sherry [name] MRN: [id number] Date of Birth: 05-11-1934 No data recorded  Encounter Date: 11/26/2020   PT End of Session - 11/26/20 1748     Visit Number 104    Number of Visits 113    Date for PT Re-Evaluation 12/17/20    Authorization Type [employer] medicare FOTO performed by PT on eval (7/20), score 12, Progress note on 03/10/2020; PN on 08/20/2020    Authorization Time Period Recert 09/08/4164- 0/63/0160; PN on 1/0/9323; Recert 5/57/3220- 25/42/7062; PN on 10/01/2020    PT Start Time 1145    PT Stop Time 1227    PT Time Calculation (min) 42 min    Equipment Utilized During Treatment Gait belt    Activity Tolerance Patient tolerated treatment well;Patient limited by fatigue;Patient limited by pain    Behavior During Therapy WFL for tasks assessed/performed             Past Medical History:  Diagnosis Date   Acute blood loss anemia    Arthritis    Cancer ([place])    skin   Central cord syndrome at C6 level of cervical spinal cord ([place]) 11/29/2017   Hypertension    Protein-calorie malnutrition, severe ([place]) 01/24/2018   S/P insertion of IVC (inferior vena caval) filter 01/24/2018   Tetraparesis ([place])     Past Surgical History:  Procedure Laterality Date   ANTERIOR CERVICAL DECOMP/DISCECTOMY FUSION N/A 11/29/2017   Procedure: Cervical five-six, six-seven Anterior Cervical Decompression Fusion;  Surgeon: Judith Part, MD;  Location: [place];  Service: Neurosurgery;  Laterality: N/A;  Cervical five-six, six-seven Anterior Cervical Decompression Fusion   CATARACT EXTRACTION     IR IVC FILTER PLMT / S&I /IMG GUID/MOD SED  12/08/2017    There were no vitals filed for this visit.   Subjective Assessment - 11/26/20 1745     Subjective Patient states she is dealing with  some Left ankle swelling and trying to keep it elevated    Pertinent History Pt is an 86 yo female that fell in 2019, fractured vertebrae in her neck and in her low back per family. 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 After hospital stay, pt discharged to [facility] for ~1 month, experienced severe UTI as well as bilateral DVT (IVC filter placed). Discharged to [facility], stayed for about a year, and then moved to [facility] in April 2021. Was receiving PT, but per family facility reported that did not have adequate equipment to maximize PT for her. Pt until about 1 month ago was practicing sit to stand transfers with 1-2 people, and working on static standing. Has a brace for L foot due to PF. Pt currently needs assistance with all ADLs (able to assist with donning/doffing her shirt), bed baths, and needs a hoyer lift for transfers. Able to drive power wheelchair without assistance.    Currently in Pain? No/denies    Pain Onset In the past 7 days            *Patient remains Left LE NWB due to recent ankle fracture.   Intervention:    Seated Hip march- AAROM 2 x 15 reps each Seated Knee ext w/ 3 second hold - AROM on left and AAROM on right LE. 2 sets of 15 reps Abdominal sit up - (chair reclined for optimal  ROM) 2 sets of 15 reps    Gluteal sets - Hold 5 sec x 10 reps x 2 sets      Hip abduction with min physical Assistance B LE 2 sets of 15 reps   Quad sets BLE with 5 sec hold 2 sets of 10 reps   Education provided throughout session via VC/TC and demonstration to facilitate movement at target joints and correct muscle activation for all  exercises performed.    Clinical Impression: Patient arrived in good spirits today and well motivated. She did present with mild edema in left ankle- She required less verbal cues and abel to extend right LE better today with less physical assist. Treatment continues to be limited to strengthening only right now while patient  recovers from recent ankle Fx. Patient will continue to benefit from continued PT services to increase mobility and strength to improve patient's quality of life                              PT Education - 11/26/20 1746     Education Details Exercise technique    Person(s) Educated Patient    Methods Explanation;Demonstration;Tactile cues;Verbal cues    Comprehension Verbalized understanding;Returned demonstration;Verbal cues required;Need further instruction              PT Short Term Goals - 03/10/20 2211       PT SHORT TERM GOAL #1   Title The patient will perform initial HEP with minimum assistance in order to improve strength and function.    Baseline Patient demonstrating independence with initial HEP as of 03/10/2020 with no questions or difficulty.    Time 4    Period Weeks    Status Achieved    Target Date 02/11/20               PT Long Term Goals - 11/12/20 1148       PT LONG TERM GOAL #1   Title The patient will be compliant with finalized HEP with minimum assistance in preparation for self management and maintenance of condition.    Baseline 03/10/2020- Patient familiar with initial HEP and will keep goal active as HEP becomes more progressive including possible transfers and standing activities. 04/07/2020- Patient continues to report compliance with LE strengthening home exercises without questions or concerns. 04/21/2020- Patient able to verbalize and demonstrate good understanding of current HEP including some supine and seated LE strengthening exercises. Reviewed and patient performed 10 reps today with minimal cueing. Will keep goal active to progress HEP as appropriate. 05/28/2020- patient reports continues to perform LE strengthening as able and some help from caregiver as able. No changes at this time to home program as patient is limited to supine/seated therex. 07/02/2020- Patient continues to report compliance with current supine and  seated LE strengtheing home program and states no questions or concerns regarding her plan.    Time 12    Period Weeks    Status Achieved      PT LONG TERM GOAL #2   Title The patient will demonstrate at least 10 point improvement on FOTO score indicating an improved ability to perform functional activities.    Baseline on eval (/720) score 12; 10/22/19: 12; 12/10/19: 18, 12/13: 18: 04/07/2020- Will assess next visit. 07/02/2020- Will obtain next visit. 09/24/2020= Will obtain next visit. 11/12/2020= 12    Time 12    Period Weeks    Status On-going  Target Date 12/17/20      PT LONG TERM GOAL #3   Title The patient will demonstrate lateral scooting  for assistance with transfers with minA to maximize independence.    Baseline 10/22/19: Pt requires modA+1 for lateral scooting; 12/10/19: modA+1, 12/13: modA-maxA, 12/27: modA-maxA, 03/10/2020- ModA-MaxA- increased verbal cues and visual demonstration. 04/07/2020- Continued Max assist to perform forward/lateral scoot. 04/21/2020- Patient was able to demo slight lateral scoot with CGA approx 12 in then fatigued requiring max assist- she is able to scoot forward by leaning without significant issues. 05/28/2020- While sitting in chair- patient able to perform forward and lateral scoot with VC/visual demo and increased time allowed- CGA today but not consistent yet- will keep goal active at this time to continue to focus on strengthening and improving technique. 07/02/2020- Patient able to demonstrate minimal ability to laterally shift weigh on mat table requiring max assist yet is able to forward scoot out to edge of mat table with min Assist. 08/20/2020 - Patient was able to demonstrate minimal lateral scooting at edge of mat while holding feet still so they did not slip on tile surface. She continues to be able to demonstrate scoot forward and uses tilt option to recline to position safely back into seat of power wheelchair. 09/25/2020- Patient able to sit on mat and  minimally scoot laterally but requires min/mod A for efficient scooting. Will end goal at this time due to plateau in progress with scooting over extended time.    Time 12    Period Weeks    Status Not Met      PT LONG TERM GOAL #4   Title The patient will demonstrate a squat pivot transfer with minimum assistance to maximize independence and mobility.    Baseline 10/22/19: unable to safely attempt at this time; 12/10/19: unable to safely attempt at this time, 01/14/20: unable to safety attempt at this time. 03/10/2020- Patient able to perform Stand pivot transfer with Maximal assist. 04/07/2020- Patient continues to require Max assist with sit to stand- will assist SPT next visit. 04/21/2020- Patient presents with Max assist to perform Sit to stand Transfer- unable to pivot or move Left LE well without difficulty. 07/02/2020= Max assist with SPT and minimal ability to pivot. 7/202/2022-Patient able to stand with minimal assist this visit (as good as CGA last visit) and able to move right foot to pivot and performing with moderate assist today. 09/24/2020- Patient able to perform sit to stand with Min assist from lift chair. Patient has demo inconsistency and will keep goal active.    Time 12    Period Weeks    Status On-going      PT LONG TERM GOAL #5   Title The patient will demonstrate sitting without UE support for 2-5 minutes at EOB to improve participation and maximize independence with ADLs.    Baseline 10/22/19: Pt able to sit unsupported for at least 2 minutes at edge of bed. 04/21/2020- Patient continues to demo good sitting at edge of mat > 5 miin without difficulty or back support.    Time 12    Period Weeks    Status Achieved      PT LONG TERM GOAL #6   Title Patient will demonstrate improved functional LE strength as seen by consistent ability to stand > 2 min (3 out of 4 trials) with Mod/Max A for improved LE strength with transfers.    Baseline 05/28/2020- Patient able to inconsistent stand  1-2 min right now in //  bars with Mod/Max A. 07/02/2020- Patient was able to progress last 2 visit to standing using bilateral platform attachment between 1-2 min which is a progression from the parallel bars. 08/20/2020- Patient performed static standing with min- mod A using bilateral platform attachment on walker for 6 min 30 sec today with assist for trunk control and intermittent verbal cues for posture. 09/24/2020- Patient has consistently been able to stand >2 min in // bars and when using bilateral Platform walker.    Time 12    Period Weeks    Status Achieved      PT LONG TERM GOAL #7   Title Patient/caregiver will demonstrate assisted transfer using [place] consistently Independently for improved ability to transfer at home from lift chair to bed.    Baseline 09/24/2020- Patient requires hoyer lift (Dependent) for safe transfers.    Time 12    Period Weeks    Status New                   Plan - 11/26/20 1750     Clinical Impression Statement Patient arrived in good spirits today and well motivated. She did present with mild edema in left ankle- She required less verbal cues and abel to extend right LE better today with less physical assist. Treatment continues to be limited to strengthening only right now while patient recovers from recent ankle Fx. Patient will continue to benefit from continued PT services to increase mobility and strength to improve patient's quality of life    Personal Factors and Comorbidities Age;Time since onset of injury/illness/exacerbation;Comorbidity 3+;Fitness    Comorbidities HTN, quadriparesis, history of DVT, neurogenic bladder    Examination-Activity Limitations Bathing;Hygiene/Grooming;Squat;Bed Mobility;Lift;Bend;Stand;Engineer, manufacturing;Toileting;Self Feeding;Transfers;Continence;Sit;Dressing;Sleep;Carry    Examination-Participation Restrictions Church;Laundry;Cleaning;Medication Management;Community Activity;Meal  Prep;Interpersonal Relationship    Stability/Clinical Decision Making Evolving/Moderate complexity    Rehab Potential Fair    PT Frequency 2x / week    PT Duration 12 weeks    PT Treatment/Interventions ADLs/Self Care Home Management;Electrical Stimulation;Therapeutic activities;Wheelchair mobility training;Vasopneumatic Device;Joint Manipulations;Vestibular;Passive range of motion;Patient/family education;Therapeutic exercise;DME Instruction;Biofeedback;Aquatic Therapy;Moist Heat;Gait training;Balance training;Orthotic Fit/Training;Dry needling;Energy conservation;Taping;Splinting;Neuromuscular re-education;Cryotherapy;Ultrasound;Functional mobility training    PT Next Visit Plan Continue with progressive sanding/transfer training.    PT Home Exercise Plan no changes    Consulted and Agree with Plan of Care Patient             Patient will benefit from skilled therapeutic intervention in order to improve the following deficits and impairments:  Abnormal gait, Decreased balance, Decreased endurance, Decreased mobility, Difficulty walking, Hypomobility, Impaired sensation, Decreased range of motion, Improper body mechanics, Impaired perceived functional ability, Decreased activity tolerance, Decreased knowledge of use of DME, Decreased safety awareness, Decreased strength, Impaired flexibility, Impaired UE functional use, Postural dysfunction  Visit Diagnosis: Abnormality of gait and mobility  Difficulty in walking, not elsewhere classified  Muscle weakness (generalized)  Unsteadiness on feet     Problem List Patient Active Problem List   Diagnosis Date Noted   Acute appendicitis with appendiceal abscess 05/30/2018   Hypocalcemia 02/15/2018   Dysuria 02/15/2018   Bilateral lower extremity edema 02/11/2018   Vaginal yeast infection 01/30/2018   Chest tightness 01/27/2018   Healthcare-associated pneumonia 01/25/2018   Pleural effusion, not elsewhere classified 01/25/2018   Acute  deep vein thrombosis (DVT) of left lower extremity ([place]) 01/24/2018   Acute deep vein thrombosis (DVT) of right lower extremity ([place]) 01/24/2018   Chronic allergic rhinitis 01/24/2018   Depression with anxiety 01/24/2018   UTI due  to Klebsiella species 01/24/2018   Protein-calorie malnutrition, severe ([place]) 01/24/2018   S/P insertion of IVC (inferior vena caval) filter 01/24/2018   Reactive depression    Hypokalemia    Leukocytosis    Essential hypertension    Trauma    Tetraparesis ([facility])    Neuropathic pain    Neurogenic bowel    Neurogenic bladder    Benign essential HTN    Acute blood loss anemia    Central cord syndrome at C6 level of cervical spinal cord ([place]) 11/29/2017   Central cord syndrome ([place]) 11/29/2017    Lewis Moccasin, PT 11/26/2020, 6:03 PM  [place] MAIN [facility] SERVICES [address] [place], [place], [address] Phone: [phone]   Fax:  [phone]  Name: YOULANDA TOMASSETTI MRN: [id number] Date of Birth: 1934-06-26

## 2020-12-01 ENCOUNTER — Encounter: Payer: Self-pay | Admitting: Occupational Therapy

## 2020-12-01 ENCOUNTER — Ambulatory Visit: Payer: Medicare HMO

## 2020-12-01 ENCOUNTER — Other Ambulatory Visit: Payer: Self-pay

## 2020-12-01 ENCOUNTER — Ambulatory Visit: Payer: Medicare HMO | Admitting: Occupational Therapy

## 2020-12-01 DIAGNOSIS — M6281 Muscle weakness (generalized): Secondary | ICD-10-CM | POA: Diagnosis not present

## 2020-12-01 DIAGNOSIS — R278 Other lack of coordination: Secondary | ICD-10-CM

## 2020-12-01 DIAGNOSIS — R269 Unspecified abnormalities of gait and mobility: Secondary | ICD-10-CM

## 2020-12-01 DIAGNOSIS — R2681 Unsteadiness on feet: Secondary | ICD-10-CM

## 2020-12-01 DIAGNOSIS — S14129S Central cord syndrome at unspecified level of cervical spinal cord, sequela: Secondary | ICD-10-CM

## 2020-12-01 DIAGNOSIS — R262 Difficulty in walking, not elsewhere classified: Secondary | ICD-10-CM

## 2020-12-01 NOTE — Therapy (Signed)
[place] MAIN [facility] SERVICES [address] [place], [place], [address] Phone: [phone]   Fax:  [phone]  Occupational Therapy Treatment  Patient Details  Name: Sherry [name] MRN: [id number] Date of Birth: [birth year] No data recorded  Encounter Date: 12/01/2020   OT End of Session - 12/01/20 1443     Visit Number 81    Number of Visits 96    Date for OT Re-Evaluation 01/12/21    Authorization Time Period Progress report period starting 11/26/2020    OT Start Time 1100    OT Stop Time 1145    OT Time Calculation (min) 45 min    Activity Tolerance Patient tolerated treatment well    Behavior During Therapy [facility] for tasks assessed/performed             Past Medical History:  Diagnosis Date   Acute blood loss anemia    Arthritis    Cancer ([place])    skin   Central cord syndrome at C6 level of cervical spinal cord ([place]) 11/29/2017   Hypertension    Protein-calorie malnutrition, severe ([place]) 01/24/2018   S/P insertion of IVC (inferior vena caval) filter 01/24/2018   Tetraparesis ([place])     Past Surgical History:  Procedure Laterality Date   ANTERIOR CERVICAL DECOMP/DISCECTOMY FUSION N/A 11/29/2017   Procedure: Cervical five-six, six-seven Anterior Cervical Decompression Fusion;  Surgeon: Judith Part, MD;  Location: [place];  Service: Neurosurgery;  Laterality: N/A;  Cervical five-six, six-seven Anterior Cervical Decompression Fusion   CATARACT EXTRACTION     IR IVC FILTER PLMT / S&I /IMG GUID/MOD SED  12/08/2017    There were no vitals filed for this visit.   Subjective Assessment - 12/01/20 1435     Subjective  Pt. reports that her foot is feeling better. Pt. was wearing her left brace.    Patient is accompanied by: Family member    Pertinent History Patient s/p fall November 29, 2017 resulting in diagnosis of central cord syndrome at C 6 level.  She has had therapy in multiple venues but with recent move to [facility], therapy staff recommended she seek outpatient therapy for her needs.    Patient Stated Goals Patient would like to be able to move better in bed, stand and perform self care tasks.    Currently in Pain? No/denies            OT TREATMENT     Pt. tolerated bilateral shoulder flexion, extension, abduction, elbow flexion, extension forearm supination AAROM/AROM. PROM bilateral wrist extension, digit MP, PIP, and DIP flexion, and extension, thumb radial, and palmar abduction in conjunction with moist heat. Pt. worked on [employer], and reciprocal motion using the UBE while sitting for 57mn. with minimal resistance. Constant monitoring was provided. Pt. worked on reaching with abduction reaching out to the far left, and the far right using the Sa[employer]o horizontal ring tower.  Patient worked reaching out for the [employer] Communicationswith the left UE, and passing it to the right then reaching out with the right to place them on the 2nd horizontal rung with 2# wrist weight in place. Pt. was able to perform the task more freely today. Pt. worked on Bilateral UE reaching, and ROM in order to improve functional reaching for ADL, IADL tasks, and haircare.   Pt. arrived to therapy with her left foot brace on. Pt. reports that her left foot feels better with the brace on. Pt. continues to make progress  overall, and is improving with BUE reaching in preparation for improving overhead ADLs, and haircare. Pt. continues to tolerate ROM, moist heat modality, and functional reaching without pain. Pt. was able  to reach out to the far left, and far right to move the Saebo rings through the 2nd horizontal rungs of progressively increasing heights with both the right, and left UEs. Pt. required less time to complete, and was able to reach further with no leaning noted today during reaching tasks. Pt. was able to tolerate the UBE for 8 min. with the UBE elevated for increased ROM. Pt. tolerated ROM well without reports of  pain, or discomfort. Pt. continues to benefit from working on improving ROM in order to work towards increasing engagement of her bilateral hands during ADLs, and IADL tasks.                         OT Education - 12/01/20 1442     Education Details Goals    Person(s) Educated Patient;Caregiver(s)    Methods Explanation    Comprehension Verbalized understanding;Need further instruction                 OT Long Term Goals - 10/20/20 1302       OT LONG TERM GOAL #1   Title Patient and caregiver will demonstrate understanding of home exercise program for ROM.    Baseline Pt. continues to have a restorative aide rehab aide assist her wih exercises at [facility]. 8/8: Independently completes/directs HEP mulitple times/week    Time 12    Period Weeks    Status Partially Met    Target Date 01/12/21      OT LONG TERM GOAL #4   Title Patient will demonstrate improved composite finger flexion to hold deodorant to apply to underarms.    Baseline 10/20/2020: Pt. conitnues to make steady progress with bilateral digit flexion.Pt. conitnues to present with stiifness/tightness which is hindering pt.'s ability to formulate a full composite fist. Pt. continues to present with improving finger flexion of R hand for a partial gross grasp, but unable to grasp and hold items. pt. conitnues to present with limited digit flexion of L hand. Pt. has active left thumb abduction. 8/8: holds cup with 2 hands, continues to have limited composite finger flexion L worse than R    Time 12    Period Weeks    Status On-going    Target Date 01/12/21      OT LONG TERM GOAL #6   Title Patient will increase right UE ROM to comb the back of her hair with modified independence.    Baseline 10/20/2020: Pt. continues to progress wtith RRUE ROM, and is able to use a pic for the back of her head, however is unable to brush the back of her hair. 07/30/2020: Pt. is now able to comb the back of her hair with  a pic, however continues to work on completing the task efficiently. 8/8: Achieves with great effort, has assistance for efficiancy    Time 12    Period Weeks    Status Partially Met    Target Date 01/12/21      OT LONG TERM GOAL #7   Title Pt. will independently, and efficiently send text messages on her phone.    Baseline 10/20/2020: Pt. is now able to type a text message on the phone, however not efficiently taking increased time to complete. 07/30/2020: Pt. continues to present with limited  BUE strength, and [place] skills. 8/8: independtly drafts text messages, continues to work on accuracy and efficiancy 2/2 limited [facility]    Time 12    Period Weeks    Status Partially Met    Target Date 01/12/21      OT LONG TERM GOAL #8   Title Pt. will write, and sign her name with 100% legibility, and modified independence.    Baseline 10/20/2020: Pt. continues to make progress overall. Pt. continues to consistently fill out her daily menu, and writing for crossword puzzles. Pt. is able to maintain grasp on a wide width pen. [place] to work on [employer]. 8/8: writes daily, continues to work on improving speed    Time 12    Period Weeks    Status Partially Met    Target Date 01/12/21                   Plan - 12/01/20 1444     Clinical Impression Statement Pt. arrived to therapy with her left foot brace on. Pt. reports that her left foot feels better with the brace on. Pt. continues to make progress overall, and is improving with BUE reaching in preparation for improving overhead ADLs, and haircare. Pt. continues to tolerate ROM, moist heat modality, and functional reaching without pain. Pt. was able  to reach out to the far left, and far right to move the Saebo rings through the 2nd horizontal rungs of progressively increasing heights with both the right, and left UEs. Pt. required less time to complete, and was able to reach further with no leaning noted today during reaching tasks. Pt.  was able to tolerate the UBE for 8 min. with the UBE elevated for increased ROM. Pt. tolerated ROM well without reports of pain, or discomfort. Pt. continues to benefit from working on improving ROM in order to work towards increasing engagement of her bilateral hands during ADLs, and IADL tasks.      OT Occupational Profile and History Detailed Assessment- Review of Records and additional review of physical, cognitive, psychosocial history related to current functional performance    Occupational performance deficits (Please refer to evaluation for details): ADL's;IADL's;Leisure;Social Participation    Body Structure / Function / Physical Skills ADL;Continence;Dexterity;Flexibility;Strength;ROM;Balance;Coordination;FMC;IADL;Endurance;UE functional use;Decreased knowledge of use of DME;GMC    Psychosocial Skills Coping Strategies    Rehab Potential Fair    Clinical Decision Making Several treatment options, min-mod task modification necessary    Comorbidities Affecting Occupational Performance: Presence of comorbidities impacting occupational performance    Comorbidities impacting occupational performance description: contractures of bilateral hands, dependent transfers,    Modification or Assistance to Complete Evaluation  No modification of tasks or assist necessary to complete eval    OT Frequency 2x / week    OT Duration 12 weeks    OT Treatment/Interventions Self-care/ADL training;Cryotherapy;Therapeutic exercise;DME and/or AE instruction;Balance training;Neuromuscular education;Manual Therapy;Splinting;Moist Heat;Passive range of motion;Therapeutic activities;Patient/family education    Consulted and Agree with Plan of Care Patient             Patient will benefit from skilled therapeutic intervention in order to improve the following deficits and impairments:   Body Structure / Function / Physical Skills: ADL, Continence, Dexterity, Flexibility, Strength, ROM, Balance, Coordination,  FMC, IADL, Endurance, UE functional use, Decreased knowledge of use of DME, GMC   Psychosocial Skills: Coping Strategies   Visit Diagnosis: Muscle weakness (generalized)  Other lack of coordination    Problem List Patient Active Problem List  Diagnosis Date Noted   Acute appendicitis with appendiceal abscess 05/30/2018   Hypocalcemia 02/15/2018   Dysuria 02/15/2018   Bilateral lower extremity edema 02/11/2018   Vaginal yeast infection 01/30/2018   Chest tightness 01/27/2018   Healthcare-associated pneumonia 01/25/2018   Pleural effusion, not elsewhere classified 01/25/2018   Acute deep vein thrombosis (DVT) of left lower extremity ([place]) 01/24/2018   Acute deep vein thrombosis (DVT) of right lower extremity ([place]) 01/24/2018   Chronic allergic rhinitis 01/24/2018   Depression with anxiety 01/24/2018   UTI due to Klebsiella species 01/24/2018   Protein-calorie malnutrition, severe ([place]) 01/24/2018   S/P insertion of IVC (inferior vena caval) filter 01/24/2018   Reactive depression    Hypokalemia    Leukocytosis    Essential hypertension    Trauma    Tetraparesis ([facility])    Neuropathic pain    Neurogenic bowel    Neurogenic bladder    Benign essential HTN    Acute blood loss anemia    Central cord syndrome at C6 level of cervical spinal cord ([place]) 11/29/2017   Central cord syndrome ([place]) 11/29/2017    Harrel Carina, MS, OTR/L 12/01/2020, 2:45 PM  [place] MAIN [facility] SERVICES [address] [place], [place], [address] Phone: [phone]   Fax:  [phone]  Name: CARSON MECHE MRN: [id number] Date of Birth: 1934/10/24

## 2020-12-01 NOTE — Therapy (Signed)
[place] MAIN [facility] SERVICES [address] [place], [place], [address] Phone: [phone]   Fax:  [phone]  Physical Therapy Treatment  Patient Details  Name: Sherry [name] MRN: [id number] Date of Birth: 30-Jan-1935 No data recorded  Encounter Date: 12/01/2020   PT End of Session - 12/01/20 1320     Visit Number 105    Number of Visits 113    Date for PT Re-Evaluation 12/17/20    Authorization Type [employer]are FOTO performed by PT on eval (7/20), score 12, Progress note on 03/10/2020; PN on 08/20/2020    Authorization Time Period Recert 5/0/3546- 5/68/1275; PN on 02/08/15; Recert 4/94/4967- 59/16/3846; PN on 10/01/2020    PT Start Time 1145    PT Stop Time 1224    PT Time Calculation (min) 39 min    Equipment Utilized During Treatment Gait belt    Activity Tolerance Patient tolerated treatment well;Patient limited by fatigue;Patient limited by pain    Behavior During Therapy WFL for tasks assessed/performed             Past Medical History:  Diagnosis Date   Acute blood loss anemia    Arthritis    Cancer ([place])    skin   Central cord syndrome at C6 level of cervical spinal cord ([place]) 11/29/2017   Hypertension    Protein-calorie malnutrition, severe ([place]) 01/24/2018   S/P insertion of IVC (inferior vena caval) filter 01/24/2018   Tetraparesis ([place])     Past Surgical History:  Procedure Laterality Date   ANTERIOR CERVICAL DECOMP/DISCECTOMY FUSION N/A 11/29/2017   Procedure: Cervical five-six, six-seven Anterior Cervical Decompression Fusion;  Surgeon: Judith Part, MD;  Location: [place];  Service: Neurosurgery;  Laterality: N/A;  Cervical five-six, six-seven Anterior Cervical Decompression Fusion   CATARACT EXTRACTION     IR IVC FILTER PLMT / S&I /IMG GUID/MOD SED  12/08/2017    There were no vitals filed for this visit.   Subjective Assessment - 12/01/20 1147     Subjective Pt reports initial pain in L foot but  after donning "boot" she has not had any pain.    Pertinent History Pt is an 86 yo female that fell in 2019, fractured vertebrae in her neck and in her low back per family. 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 After hospital stay, pt discharged to [facility] for ~1 month, experienced severe UTI as well as bilateral DVT (IVC filter placed). Discharged to [facility], stayed for about a year, and then moved to [facility] in April 2021. Was receiving PT, but per family facility reported that did not have adequate equipment to maximize PT for her. Pt until about 1 month ago was practicing sit to stand transfers with 1-2 people, and working on static standing. Has a brace for L foot due to PF. Pt currently needs assistance with all ADLs (able to assist with donning/doffing her shirt), bed baths, and needs a hoyer lift for transfers. Able to drive power wheelchair without assistance.    Currently in Pain? No/denies    Pain Onset In the past 7 days             *Patient remains Left LE NWB due to recent ankle fracture     There.ex:   Seated Hip march- AAROM on RLE and AROM on LLE 2 x 15 reps each   Seated Knee ext w/ 3 second hold - AROM on left and AAROM on right LE. 2 sets  of 15 reps  Abdominal sit up - (chair reclined for optimal ROM) 2 sets of 15 reps, reaching across body for oblique engagement, 2x15   Gluteal sets - Hold 5 sec x 10 reps x 2 sets    Hip abduction with min physical Assistance B LE 2 sets of 15 reps  Hip adduction ball squeezes with rainbow ball: 2x15, 3 sec holds   Quad sets BLE with 5 sec hold 2 sets of 12 reps, LE resting on standard height chair, minA to keep R hip in neutral alignment.        PT Education - 12/01/20 1152     Education Details form/technique with exercise    Person(s) Educated Patient    Methods Explanation;Demonstration;Tactile cues;Verbal cues    Comprehension Verbalized understanding;Returned demonstration               PT Short Term Goals - 03/10/20 2211       PT SHORT TERM GOAL #1   Title The patient will perform initial HEP with minimum assistance in order to improve strength and function.    Baseline Patient demonstrating independence with initial HEP as of 03/10/2020 with no questions or difficulty.    Time 4    Period Weeks    Status Achieved    Target Date 02/11/20               PT Long Term Goals - 11/12/20 1148       PT LONG TERM GOAL #1   Title The patient will be compliant with finalized HEP with minimum assistance in preparation for self management and maintenance of condition.    Baseline 03/10/2020- Patient familiar with initial HEP and will keep goal active as HEP becomes more progressive including possible transfers and standing activities. 04/07/2020- Patient continues to report compliance with LE strengthening home exercises without questions or concerns. 04/21/2020- Patient able to verbalize and demonstrate good understanding of current HEP including some supine and seated LE strengthening exercises. Reviewed and patient performed 10 reps today with minimal cueing. Will keep goal active to progress HEP as appropriate. 05/28/2020- patient reports continues to perform LE strengthening as able and some help from caregiver as able. No changes at this time to home program as patient is limited to supine/seated therex. 07/02/2020- Patient continues to report compliance with current supine and seated LE strengtheing home program and states no questions or concerns regarding her plan.    Time 12    Period Weeks    Status Achieved      PT LONG TERM GOAL #2   Title The patient will demonstrate at least 10 point improvement on FOTO score indicating an improved ability to perform functional activities.    Baseline on eval (/720) score 12; 10/22/19: 12; 12/10/19: 18, 12/13: 18: 04/07/2020- Will assess next visit. 07/02/2020- Will obtain next visit. 09/24/2020= Will obtain next visit. 11/12/2020= 12    Time  12    Period Weeks    Status On-going    Target Date 12/17/20      PT LONG TERM GOAL #3   Title The patient will demonstrate lateral scooting  for assistance with transfers with minA to maximize independence.    Baseline 10/22/19: Pt requires modA+1 for lateral scooting; 12/10/19: modA+1, 12/13: modA-maxA, 12/27: modA-maxA, 03/10/2020- ModA-MaxA- increased verbal cues and visual demonstration. 04/07/2020- Continued Max assist to perform forward/lateral scoot. 04/21/2020- Patient was able to demo slight lateral scoot with CGA approx 12 in then fatigued requiring max assist-  she is able to scoot forward by leaning without significant issues. 05/28/2020- While sitting in chair- patient able to perform forward and lateral scoot with VC/visual demo and increased time allowed- CGA today but not consistent yet- will keep goal active at this time to continue to focus on strengthening and improving technique. 07/02/2020- Patient able to demonstrate minimal ability to laterally shift weigh on mat table requiring max assist yet is able to forward scoot out to edge of mat table with min Assist. 08/20/2020 - Patient was able to demonstrate minimal lateral scooting at edge of mat while holding feet still so they did not slip on tile surface. She continues to be able to demonstrate scoot forward and uses tilt option to recline to position safely back into seat of power wheelchair. 09/25/2020- Patient able to sit on mat and minimally scoot laterally but requires min/mod A for efficient scooting. Will end goal at this time due to plateau in progress with scooting over extended time.    Time 12    Period Weeks    Status Not Met      PT LONG TERM GOAL #4   Title The patient will demonstrate a squat pivot transfer with minimum assistance to maximize independence and mobility.    Baseline 10/22/19: unable to safely attempt at this time; 12/10/19: unable to safely attempt at this time, 01/14/20: unable to safety attempt at this time.  03/10/2020- Patient able to perform Stand pivot transfer with Maximal assist. 04/07/2020- Patient continues to require Max assist with sit to stand- will assist SPT next visit. 04/21/2020- Patient presents with Max assist to perform Sit to stand Transfer- unable to pivot or move Left LE well without difficulty. 07/02/2020= Max assist with SPT and minimal ability to pivot. 7/202/2022-Patient able to stand with minimal assist this visit (as good as CGA last visit) and able to move right foot to pivot and performing with moderate assist today. 09/24/2020- Patient able to perform sit to stand with Min assist from lift chair. Patient has demo inconsistency and will keep goal active.    Time 12    Period Weeks    Status On-going      PT LONG TERM GOAL #5   Title The patient will demonstrate sitting without UE support for 2-5 minutes at EOB to improve participation and maximize independence with ADLs.    Baseline 10/22/19: Pt able to sit unsupported for at least 2 minutes at edge of bed. 04/21/2020- Patient continues to demo good sitting at edge of mat > 5 miin without difficulty or back support.    Time 12    Period Weeks    Status Achieved      PT LONG TERM GOAL #6   Title Patient will demonstrate improved functional LE strength as seen by consistent ability to stand > 2 min (3 out of 4 trials) with Mod/Max A for improved LE strength with transfers.    Baseline 05/28/2020- Patient able to inconsistent stand 1-2 min right now in //bars with Mod/Max A. 07/02/2020- Patient was able to progress last 2 visit to standing using bilateral platform attachment between 1-2 min which is a progression from the parallel bars. 08/20/2020- Patient performed static standing with min- mod A using bilateral platform attachment on walker for 6 min 30 sec today with assist for trunk control and intermittent verbal cues for posture. 09/24/2020- Patient has consistently been able to stand >2 min in // bars and when using bilateral Platform  walker.  Time 12    Period Weeks    Status Achieved      PT LONG TERM GOAL #7   Title Patient/caregiver will demonstrate assisted transfer using [place] consistently Independently for improved ability to transfer at home from lift chair to bed.    Baseline 09/24/2020- Patient requires hoyer lift (Dependent) for safe transfers.    Time 12    Period Weeks    Status New                   Plan - 12/01/20 1321     Clinical Impression Statement Pt arrived to clinic ISQ. Pt remains [place] on LLE due to acute ankle fracture. Attempts made in LE therex progression to pt's abilities and to tolerance. Remains needing AAROM on RLE due to strength deficits. Pt displays excellent core strength with progression to ab crunches with rotation to engage obliques. Pt tolerated session well. Will continue to benefit from skilled PT sessions to progress mobility as able with current NWB precautions on LLE.    Personal Factors and Comorbidities Age;Time since onset of injury/illness/exacerbation;Comorbidity 3+;Fitness    Comorbidities HTN, quadriparesis, history of DVT, neurogenic bladder    Examination-Activity Limitations Bathing;Hygiene/Grooming;Squat;Bed Mobility;Lift;Bend;Stand;Engineer, manufacturing;Toileting;Self Feeding;Transfers;Continence;Sit;Dressing;Sleep;Carry    Examination-Participation Restrictions Church;Laundry;Cleaning;Medication Management;Community Activity;Meal Prep;Interpersonal Relationship    Stability/Clinical Decision Making Evolving/Moderate complexity    Rehab Potential Fair    PT Frequency 2x / week    PT Duration 12 weeks    PT Treatment/Interventions ADLs/Self Care Home Management;Electrical Stimulation;Therapeutic activities;Wheelchair mobility training;Vasopneumatic Device;Joint Manipulations;Vestibular;Passive range of motion;Patient/family education;Therapeutic exercise;DME Instruction;Biofeedback;Aquatic Therapy;Moist Heat;Gait training;Balance  training;Orthotic Fit/Training;Dry needling;Energy conservation;Taping;Splinting;Neuromuscular re-education;Cryotherapy;Ultrasound;Functional mobility training    PT Next Visit Plan Continue with progressive standing/transfer training.    PT Home Exercise Plan no changes    Consulted and Agree with Plan of Care Patient             Patient will benefit from skilled therapeutic intervention in order to improve the following deficits and impairments:  Abnormal gait, Decreased balance, Decreased endurance, Decreased mobility, Difficulty walking, Hypomobility, Impaired sensation, Decreased range of motion, Improper body mechanics, Impaired perceived functional ability, Decreased activity tolerance, Decreased knowledge of use of DME, Decreased safety awareness, Decreased strength, Impaired flexibility, Impaired UE functional use, Postural dysfunction  Visit Diagnosis: Abnormality of gait and mobility  Difficulty in walking, not elsewhere classified  Muscle weakness (generalized)  Unsteadiness on feet  Other lack of coordination  Central cord syndrome, sequela (HCC)     Problem List Patient Active Problem List   Diagnosis Date Noted   Acute appendicitis with appendiceal abscess 05/30/2018   Hypocalcemia 02/15/2018   Dysuria 02/15/2018   Bilateral lower extremity edema 02/11/2018   Vaginal yeast infection 01/30/2018   Chest tightness 01/27/2018   Healthcare-associated pneumonia 01/25/2018   Pleural effusion, not elsewhere classified 01/25/2018   Acute deep vein thrombosis (DVT) of left lower extremity ([place]) 01/24/2018   Acute deep vein thrombosis (DVT) of right lower extremity ([place]) 01/24/2018   Chronic allergic rhinitis 01/24/2018   Depression with anxiety 01/24/2018   UTI due to Klebsiella species 01/24/2018   Protein-calorie malnutrition, severe ([place]) 01/24/2018   S/P insertion of IVC (inferior vena caval) filter 01/24/2018   Reactive depression    Hypokalemia     Leukocytosis    Essential hypertension    Trauma    Tetraparesis (HCC)    Neuropathic pain    Neurogenic bowel    Neurogenic bladder    Benign essential HTN  Acute blood loss anemia    Central cord syndrome at C6 level of cervical spinal cord (HCC) 11/29/2017   Central cord syndrome ([place]) 11/29/2017    Salem Caster. Fairly IV, PT, DPT Physical Therapist- [facility]  12/01/2020, 1:26 PM  [place] MAIN [facility] SERVICES [address] [place], [place], [address] Phone: [phone]   Fax:  [phone]  Name: Sherry [name] MRN: [id number] Date of Birth: [birth year]

## 2020-12-03 ENCOUNTER — Ambulatory Visit: Payer: Medicare HMO | Attending: Internal Medicine | Admitting: Occupational Therapy

## 2020-12-03 ENCOUNTER — Ambulatory Visit: Payer: Medicare HMO

## 2020-12-03 ENCOUNTER — Other Ambulatory Visit: Payer: Self-pay

## 2020-12-03 ENCOUNTER — Encounter: Payer: Self-pay | Admitting: Occupational Therapy

## 2020-12-03 DIAGNOSIS — R278 Other lack of coordination: Secondary | ICD-10-CM | POA: Insufficient documentation

## 2020-12-03 DIAGNOSIS — M6281 Muscle weakness (generalized): Secondary | ICD-10-CM | POA: Diagnosis not present

## 2020-12-03 DIAGNOSIS — R2681 Unsteadiness on feet: Secondary | ICD-10-CM | POA: Diagnosis present

## 2020-12-03 DIAGNOSIS — R269 Unspecified abnormalities of gait and mobility: Secondary | ICD-10-CM | POA: Insufficient documentation

## 2020-12-03 DIAGNOSIS — R262 Difficulty in walking, not elsewhere classified: Secondary | ICD-10-CM

## 2020-12-03 NOTE — Therapy (Signed)
[place] MAIN [facility] SERVICES [address] [place], [place], [address] Phone: [phone]   Fax:  [phone]  Occupational Therapy Treatment  Patient Details  Name: Sherry [name] MRN: [id number] Date of Birth: 11/20/1934 No data recorded  Encounter Date: 12/03/2020   OT End of Session - 12/03/20 1309     Visit Number 82    Number of Visits 96    Date for OT Re-Evaluation 01/12/21    Authorization Time Period Progress report period starting 11/26/2020    OT Start Time 1100    OT Stop Time 1145    OT Time Calculation (min) 45 min    Activity Tolerance Patient tolerated treatment well    Behavior During Therapy [facility] for tasks assessed/performed             Past Medical History:  Diagnosis Date   Acute blood loss anemia    Arthritis    Cancer ([place])    skin   Central cord syndrome at C6 level of cervical spinal cord ([place]) 11/29/2017   Hypertension    Protein-calorie malnutrition, severe ([place]) 01/24/2018   S/P insertion of IVC (inferior vena caval) filter 01/24/2018   Tetraparesis ([place])     Past Surgical History:  Procedure Laterality Date   ANTERIOR CERVICAL DECOMP/DISCECTOMY FUSION N/A 11/29/2017   Procedure: Cervical five-six, six-seven Anterior Cervical Decompression Fusion;  Surgeon: Judith Part, MD;  Location: [facility];  Service: Neurosurgery;  Laterality: N/A;  Cervical five-six, six-seven Anterior Cervical Decompression Fusion   CATARACT EXTRACTION     IR IVC FILTER PLMT / S&I /IMG GUID/MOD SED  12/08/2017    There were no vitals filed for this visit.   Subjective Assessment - 12/03/20 1308     Subjective  Pt. reports that her foot is feeling better. Pt. was wearing her left brace.    Patient is accompanied by: Family member    Pertinent History Patient s/p fall November 29, 2017 resulting in diagnosis of central cord syndrome at C 6 level.  She has had therapy in multiple venues but with recent move to [facility], therapy staff recommended she seek outpatient therapy for her needs.    Currently in Pain? No/denies            OT TREATMENT     Pt. tolerated bilateral shoulder flexion, extension, abduction, elbow flexion, extension forearm supination AAROM/AROM. PROM bilateral wrist extension, digit MP, PIP, and DIP flexion, and extension, thumb radial, and palmar abduction in conjunction with moist heat. Pt. worked on [employer], and reciprocal motion using the UBE while sitting for 5 min. with minimal resistance. Constant monitoring was provided. Pt. worked on reaching with abduction reaching out to the far left, and the far right with bilateral 2# cuff weights in place. Patient worked reaching out for [employer] with the left UE, and passing it to the right then reaching out with the right to place them on chair height surface.  Pt. was able to perform the task more freely today. Pt. worked on Bilateral UE reaching, and ROM in order to improve functional reaching for ADL, IADL tasks, and haircare.   Pt. continues to make progress overall, and is improving with BUE reaching in preparation for improving overhead ADLs, and haircare. Pt. continues to tolerate ROM, moist heat modality, and functional reaching without pain. Pt. was able  to reach out to the far left, and far right  with 2# cuff weights in place. Pt. required  less time to complete, and was able to reach further with no leaning noted today during reaching tasks.r Pt. was able to tolerate the UBE for 5 min. with the UBE elevated for increased ROM. Pt. tolerated ROM well without reports of pain, or discomfort. Pt. Continues to present with bilateral wrist flexor tightness, and digit extensor tightness. Pt. continues to benefit from working on impoving ROM in order to work towards increasing engagement of her bilateral hands during ADLs, and IADL tasks.                          OT Education - 12/03/20 1309      Education Details BUE functioning    Person(s) Educated Patient;Caregiver(s)    Methods Explanation    Comprehension Verbalized understanding;Need further instruction                 OT Long Term Goals - 10/20/20 1302       OT LONG TERM GOAL #1   Title Patient and caregiver will demonstrate understanding of home exercise program for ROM.    Baseline Pt. continues to have a restorative aide rehab aide assist her wih exercises at [facility]. 8/8: Independently completes/directs HEP mulitple times/week    Time 12    Period Weeks    Status Partially Met    Target Date 01/12/21      OT LONG TERM GOAL #4   Title Patient will demonstrate improved composite finger flexion to hold deodorant to apply to underarms.    Baseline 10/20/2020: Pt. conitnues to make steady progress with bilateral digit flexion.Pt. conitnues to present with stiifness/tightness which is hindering pt.'s ability to formulate a full composite fist. Pt. continues to present with improving finger flexion of R hand for a partial gross grasp, but unable to grasp and hold items. pt. conitnues to present with limited digit flexion of L hand. Pt. has active left thumb abduction. 8/8: holds cup with 2 hands, continues to have limited composite finger flexion L worse than R    Time 12    Period Weeks    Status On-going    Target Date 01/12/21      OT LONG TERM GOAL #6   Title Patient will increase right UE ROM to comb the back of her hair with modified independence.    Baseline 10/20/2020: Pt. continues to progress wtith RRUE ROM, and is able to use a pic for the back of her head, however is unable to brush the back of her hair. 07/30/2020: Pt. is now able to comb the back of her hair with a pic, however continues to work on completing the task efficiently. 8/8: Achieves with great effort, has assistance for efficiancy    Time 12    Period Weeks    Status Partially Met    Target Date 01/12/21      OT LONG TERM GOAL #7    Title Pt. will independently, and efficiently send text messages on her phone.    Baseline 10/20/2020: Pt. is now able to type a text message on the phone, however not efficiently taking increased time to complete. 07/30/2020: Pt. continues to present with limited BUE strength, and [facility] skills. 8/8: independtly drafts text messages, continues to work on accuracy and efficiancy 2/2 limited [facility]    Time 12    Period Weeks    Status Partially Met    Target Date 01/12/21      OT LONG TERM  GOAL #8   Title Pt. will write, and sign her name with 100% legibility, and modified independence.    Baseline 10/20/2020: Pt. continues to make progress overall. Pt. continues to consistently fill out her daily menu, and writing for crossword puzzles. Pt. is able to maintain grasp on a wide width pen. [place] to work on [employer]. 8/8: writes daily, continues to work on improving speed    Time 12    Period Weeks    Status Partially Met    Target Date 01/12/21                   Plan - 12/03/20 1310     Clinical Impression Statement Pt. continues to make progress overall, and is improving with BUE reaching in preparation for improving overhead ADLs, and haircare. Pt. continues to tolerate ROM, moist heat modality, and functional reaching without pain. Pt. was able  to reach out to the far left, and far right  with 2# cuff weights in place. Pt. required less time to complete, and was able to reach further with no leaning noted today during reaching tasks.r Pt. was able to tolerate the UBE for 5 min. with the UBE elevated for increased ROM. Pt. tolerated ROM well without reports of pain, or discomfort. Pt. Continues to present with bilateral wrist flexor tightness, and digit extensor tightness. Pt. continues to benefit from working on impoving ROM in order to work towards increasing engagement of her bilateral hands during ADLs, and IADL tasks.   OT Occupational Profile and History Detailed Assessment-  Review of Records and additional review of physical, cognitive, psychosocial history related to current functional performance    Occupational performance deficits (Please refer to evaluation for details): ADL's;IADL's;Leisure;Social Participation    Body Structure / Function / Physical Skills ADL;Continence;Dexterity;Flexibility;Strength;ROM;Balance;Coordination;FMC;IADL;Endurance;UE functional use;Decreased knowledge of use of DME;GMC    Psychosocial Skills Coping Strategies    Rehab Potential Fair    Clinical Decision Making Several treatment options, min-mod task modification necessary    Comorbidities Affecting Occupational Performance: Presence of comorbidities impacting occupational performance    Comorbidities impacting occupational performance description: contractures of bilateral hands, dependent transfers,    Modification or Assistance to Complete Evaluation  No modification of tasks or assist necessary to complete eval    OT Frequency 2x / week    OT Duration 12 weeks    OT Treatment/Interventions Self-care/ADL training;Cryotherapy;Therapeutic exercise;DME and/or AE instruction;Balance training;Neuromuscular education;Manual Therapy;Splinting;Moist Heat;Passive range of motion;Therapeutic activities;Patient/family education    Plan continue to progress ROM of digits, wrists and shoulders as it pertains to completion of ADL tasks that pt values.    Consulted and Agree with Plan of Care Patient             Patient will benefit from skilled therapeutic intervention in order to improve the following deficits and impairments:   Body Structure / Function / Physical Skills: ADL, Continence, Dexterity, Flexibility, Strength, ROM, Balance, Coordination, FMC, IADL, Endurance, UE functional use, Decreased knowledge of use of DME, GMC   Psychosocial Skills: Coping Strategies   Visit Diagnosis: Muscle weakness (generalized)  Other lack of coordination    Problem List Patient Active  Problem List   Diagnosis Date Noted   Acute appendicitis with appendiceal abscess 05/30/2018   Hypocalcemia 02/15/2018   Dysuria 02/15/2018   Bilateral lower extremity edema 02/11/2018   Vaginal yeast infection 01/30/2018   Chest tightness 01/27/2018   Healthcare-associated pneumonia 01/25/2018   Pleural effusion, not elsewhere classified 01/25/2018  Acute deep vein thrombosis (DVT) of left lower extremity (HCC) 01/24/2018   Acute deep vein thrombosis (DVT) of right lower extremity ([place]) 01/24/2018   Chronic allergic rhinitis 01/24/2018   Depression with anxiety 01/24/2018   UTI due to Klebsiella species 01/24/2018   Protein-calorie malnutrition, severe ([place]) 01/24/2018   S/P insertion of IVC (inferior vena caval) filter 01/24/2018   Reactive depression    Hypokalemia    Leukocytosis    Essential hypertension    Trauma    Tetraparesis (HCC)    Neuropathic pain    Neurogenic bowel    Neurogenic bladder    Benign essential HTN    Acute blood loss anemia    Central cord syndrome at C6 level of cervical spinal cord ([place]) 11/29/2017   Central cord syndrome ([place]) 11/29/2017    Harrel Carina, MS, OTR/L 12/03/2020, 1:12 PM  [place] MAIN [facility] SERVICES [address] [place], [place], [address] Phone: [phone]   Fax:  [phone]  Name: Sherry [name] MRN: [id number] Date of Birth: [birth year]

## 2020-12-03 NOTE — Therapy (Signed)
[place] MAIN [facility] SERVICES [address] [place], [place], [address] Phone: [phone]   Fax:  [phone]  Physical Therapy Treatment  Patient Details  Name: Sherry [name] MRN: [id number] Date of Birth: January 10, 1935 No data recorded  Encounter Date: 12/03/2020   PT End of Session - 12/03/20 1138     Visit Number 106    Number of Visits 113    Date for PT Re-Evaluation 12/17/20    Authorization Type [employer]are FOTO performed by PT on eval (7/20), score 12, Progress note on 03/10/2020; PN on 08/20/2020    Authorization Time Period Recert 07/04/6946- 5/46/2703; PN on 5/0/0938; Recert 1/82/9937- 16/96/7893; PN on 10/01/2020    PT Start Time 1145    PT Stop Time 1224    PT Time Calculation (min) 39 min    Equipment Utilized During Treatment Gait belt    Activity Tolerance Patient tolerated treatment well;Patient limited by fatigue;Patient limited by pain    Behavior During Therapy WFL for tasks assessed/performed             Past Medical History:  Diagnosis Date   Acute blood loss anemia    Arthritis    Cancer ([place])    skin   Central cord syndrome at C6 level of cervical spinal cord ([place]) 11/29/2017   Hypertension    Protein-calorie malnutrition, severe ([place]) 01/24/2018   S/P insertion of IVC (inferior vena caval) filter 01/24/2018   Tetraparesis ([place])     Past Surgical History:  Procedure Laterality Date   ANTERIOR CERVICAL DECOMP/DISCECTOMY FUSION N/A 11/29/2017   Procedure: Cervical five-six, six-seven Anterior Cervical Decompression Fusion;  Surgeon: Judith Part, MD;  Location: [facility];  Service: Neurosurgery;  Laterality: N/A;  Cervical five-six, six-seven Anterior Cervical Decompression Fusion   CATARACT EXTRACTION     IR IVC FILTER PLMT / S&I /IMG GUID/MOD SED  12/08/2017    There were no vitals filed for this visit.   Subjective Assessment - 12/03/20 1137     Subjective Patient reports she just feels more  comfortable in her left boot. Denies any new issues.    Pertinent History Pt is an 86 yo female that fell in 2019, fractured vertebrae in her neck and in her low back per family. 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 After hospital stay, pt discharged to [facility] for ~1 month, experienced severe UTI as well as bilateral DVT (IVC filter placed). Discharged to [facility], stayed for about a year, and then moved to [facility] in April 2021. Was receiving PT, but per family facility reported that did not have adequate equipment to maximize PT for her. Pt until about 1 month ago was practicing sit to stand transfers with 1-2 people, and working on static standing. Has a brace for L foot due to PF. Pt currently needs assistance with all ADLs (able to assist with donning/doffing her shirt), bed baths, and needs a hoyer lift for transfers. Able to drive power wheelchair without assistance.    Currently in Pain? No/denies    Pain Onset In the past 7 days                        *Patient remains Left LE NWB due to recent ankle fracture.    Intervention:    Seated Hip march- AAROM 2 x 15 reps each Seated Knee ext w/ 3 second hold - AROM on left and AAROM on right LE. 2  sets of 15 reps Abdominal sit up - (chair reclined for optimal ROM) 2 sets of 15 reps    Gluteal sets - Hold 5 sec x 10 reps x 1set Heel slides BLE x 15 reps (AAROM on right)  SLR - AAROM with BLE x 15 reps (attempted last 3 reps with more eccentric control)   Seated dead lift/Lumbar ext with green yoga resistive band x 15 reps with VC and TC for technique.    Hip abduction with min physical Assistance B LE 2 sets of 15 reps   Quad sets BLE with 5 sec hold 2 sets of 10 reps   Education provided throughout session via VC/TC and demonstration to facilitate movement at target joints and correct muscle activation for all  exercises performed.    Clinical Impression: Patient presents today with good  motivation and painfree. She responded well to adding a seated deadlift/lumbar ext exercise without report of pain and able to complete with some resistance today.  She continues to perform well with LE strengthening and requiring less overall assistance with right LE with knee extension. Patient will continue to benefit from continued PT services to increase mobility and strength to improve patient's quality of life                        PT Education - 12/03/20 1305     Education Details Exercise form    Person(s) Educated Patient    Methods Explanation;Demonstration;Tactile cues;Verbal cues    Comprehension Verbalized understanding;Returned demonstration;Verbal cues required;Tactile cues required;Need further instruction              PT Short Term Goals - 03/10/20 2211       PT SHORT TERM GOAL #1   Title The patient will perform initial HEP with minimum assistance in order to improve strength and function.    Baseline Patient demonstrating independence with initial HEP as of 03/10/2020 with no questions or difficulty.    Time 4    Period Weeks    Status Achieved    Target Date 02/11/20               PT Long Term Goals - 11/12/20 1148       PT LONG TERM GOAL #1   Title The patient will be compliant with finalized HEP with minimum assistance in preparation for self management and maintenance of condition.    Baseline 03/10/2020- Patient familiar with initial HEP and will keep goal active as HEP becomes more progressive including possible transfers and standing activities. 04/07/2020- Patient continues to report compliance with LE strengthening home exercises without questions or concerns. 04/21/2020- Patient able to verbalize and demonstrate good understanding of current HEP including some supine and seated LE strengthening exercises. Reviewed and patient performed 10 reps today with minimal cueing. Will keep goal active to progress HEP as appropriate. 05/28/2020-  patient reports continues to perform LE strengthening as able and some help from caregiver as able. No changes at this time to home program as patient is limited to supine/seated therex. 07/02/2020- Patient continues to report compliance with current supine and seated LE strengtheing home program and states no questions or concerns regarding her plan.    Time 12    Period Weeks    Status Achieved      PT LONG TERM GOAL #2   Title The patient will demonstrate at least 10 point improvement on FOTO score indicating an improved ability to perform functional activities.  Baseline on eval (/720) score 12; 10/22/19: 12; 12/10/19: 18, 12/13: 18: 04/07/2020- Will assess next visit. 07/02/2020- Will obtain next visit. 09/24/2020= Will obtain next visit. 11/12/2020= 12    Time 12    Period Weeks    Status On-going    Target Date 12/17/20      PT LONG TERM GOAL #3   Title The patient will demonstrate lateral scooting  for assistance with transfers with minA to maximize independence.    Baseline 10/22/19: Pt requires modA+1 for lateral scooting; 12/10/19: modA+1, 12/13: modA-maxA, 12/27: modA-maxA, 03/10/2020- ModA-MaxA- increased verbal cues and visual demonstration. 04/07/2020- Continued Max assist to perform forward/lateral scoot. 04/21/2020- Patient was able to demo slight lateral scoot with CGA approx 12 in then fatigued requiring max assist- she is able to scoot forward by leaning without significant issues. 05/28/2020- While sitting in chair- patient able to perform forward and lateral scoot with VC/visual demo and increased time allowed- CGA today but not consistent yet- will keep goal active at this time to continue to focus on strengthening and improving technique. 07/02/2020- Patient able to demonstrate minimal ability to laterally shift weigh on mat table requiring max assist yet is able to forward scoot out to edge of mat table with min Assist. 08/20/2020 - Patient was able to demonstrate minimal lateral scooting at  edge of mat while holding feet still so they did not slip on tile surface. She continues to be able to demonstrate scoot forward and uses tilt option to recline to position safely back into seat of power wheelchair. 09/25/2020- Patient able to sit on mat and minimally scoot laterally but requires min/mod A for efficient scooting. Will end goal at this time due to plateau in progress with scooting over extended time.    Time 12    Period Weeks    Status Not Met      PT LONG TERM GOAL #4   Title The patient will demonstrate a squat pivot transfer with minimum assistance to maximize independence and mobility.    Baseline 10/22/19: unable to safely attempt at this time; 12/10/19: unable to safely attempt at this time, 01/14/20: unable to safety attempt at this time. 03/10/2020- Patient able to perform Stand pivot transfer with Maximal assist. 04/07/2020- Patient continues to require Max assist with sit to stand- will assist SPT next visit. 04/21/2020- Patient presents with Max assist to perform Sit to stand Transfer- unable to pivot or move Left LE well without difficulty. 07/02/2020= Max assist with SPT and minimal ability to pivot. 7/202/2022-Patient able to stand with minimal assist this visit (as good as CGA last visit) and able to move right foot to pivot and performing with moderate assist today. 09/24/2020- Patient able to perform sit to stand with Min assist from lift chair. Patient has demo inconsistency and will keep goal active.    Time 12    Period Weeks    Status On-going      PT LONG TERM GOAL #5   Title The patient will demonstrate sitting without UE support for 2-5 minutes at EOB to improve participation and maximize independence with ADLs.    Baseline 10/22/19: Pt able to sit unsupported for at least 2 minutes at edge of bed. 04/21/2020- Patient continues to demo good sitting at edge of mat > 5 miin without difficulty or back support.    Time 12    Period Weeks    Status Achieved      PT LONG  TERM GOAL #6  Title Patient will demonstrate improved functional LE strength as seen by consistent ability to stand > 2 min (3 out of 4 trials) with Mod/Max A for improved LE strength with transfers.    Baseline 05/28/2020- Patient able to inconsistent stand 1-2 min right now in //bars with Mod/Max A. 07/02/2020- Patient was able to progress last 2 visit to standing using bilateral platform attachment between 1-2 min which is a progression from the parallel bars. 08/20/2020- Patient performed static standing with min- mod A using bilateral platform attachment on walker for 6 min 30 sec today with assist for trunk control and intermittent verbal cues for posture. 09/24/2020- Patient has consistently been able to stand >2 min in // bars and when using bilateral Platform walker.    Time 12    Period Weeks    Status Achieved      PT LONG TERM GOAL #7   Title Patient/caregiver will demonstrate assisted transfer using Barry consistently Independently for improved ability to transfer at home from lift chair to bed.    Baseline 09/24/2020- Patient requires hoyer lift (Dependent) for safe transfers.    Time 12    Period Weeks    Status New                   Plan - 12/03/20 1139     Clinical Impression Statement Patient presents today with good motivation and painfree. She responded well to adding a seated deadlift/lumbar ext exercise without report of pain and able to complete with some resistance today.  She continues to perform well with LE strengthening and requiring less overall assistance with right LE with knee extension. Patient will continue to benefit from continued PT services to increase mobility and strength to improve patient's quality of life    Personal Factors and Comorbidities Age;Time since onset of injury/illness/exacerbation;Comorbidity 3+;Fitness    Comorbidities HTN, quadriparesis, history of DVT, neurogenic bladder    Examination-Activity Limitations  Bathing;Hygiene/Grooming;Squat;Bed Mobility;Lift;Bend;Stand;Engineer, manufacturing;Toileting;Self Feeding;Transfers;Continence;Sit;Dressing;Sleep;Carry    Examination-Participation Restrictions Church;Laundry;Cleaning;Medication Management;Community Activity;Meal Prep;Interpersonal Relationship    Stability/Clinical Decision Making Evolving/Moderate complexity    Rehab Potential Fair    PT Frequency 2x / week    PT Duration 12 weeks    PT Treatment/Interventions ADLs/Self Care Home Management;Electrical Stimulation;Therapeutic activities;Wheelchair mobility training;Vasopneumatic Device;Joint Manipulations;Vestibular;Passive range of motion;Patient/family education;Therapeutic exercise;DME Instruction;Biofeedback;Aquatic Therapy;Moist Heat;Gait training;Balance training;Orthotic Fit/Training;Dry needling;Energy conservation;Taping;Splinting;Neuromuscular re-education;Cryotherapy;Ultrasound;Functional mobility training    PT Next Visit Plan Continue with progressive standing/transfer training.    PT Home Exercise Plan no changes    Consulted and Agree with Plan of Care Patient             Patient will benefit from skilled therapeutic intervention in order to improve the following deficits and impairments:  Abnormal gait, Decreased balance, Decreased endurance, Decreased mobility, Difficulty walking, Hypomobility, Impaired sensation, Decreased range of motion, Improper body mechanics, Impaired perceived functional ability, Decreased activity tolerance, Decreased knowledge of use of DME, Decreased safety awareness, Decreased strength, Impaired flexibility, Impaired UE functional use, Postural dysfunction  Visit Diagnosis: Abnormality of gait and mobility  Difficulty in walking, not elsewhere classified  Muscle weakness (generalized)  Unsteadiness on feet     Problem List Patient Active Problem List   Diagnosis Date Noted   Acute appendicitis with appendiceal abscess 05/30/2018    Hypocalcemia 02/15/2018   Dysuria 02/15/2018   Bilateral lower extremity edema 02/11/2018   Vaginal yeast infection 01/30/2018   Chest tightness 01/27/2018   Healthcare-associated pneumonia 01/25/2018   Pleural effusion, not  elsewhere classified 01/25/2018   Acute deep vein thrombosis (DVT) of left lower extremity ([place]) 01/24/2018   Acute deep vein thrombosis (DVT) of right lower extremity ([place]) 01/24/2018   Chronic allergic rhinitis 01/24/2018   Depression with anxiety 01/24/2018   UTI due to Klebsiella species 01/24/2018   Protein-calorie malnutrition, severe ([place]) 01/24/2018   S/P insertion of IVC (inferior vena caval) filter 01/24/2018   Reactive depression    Hypokalemia    Leukocytosis    Essential hypertension    Trauma    Tetraparesis ([facility])    Neuropathic pain    Neurogenic bowel    Neurogenic bladder    Benign essential HTN    Acute blood loss anemia    Central cord syndrome at C6 level of cervical spinal cord ([name]) 11/29/2017   Central cord syndrome ([place]) 11/29/2017    Lewis Moccasin, PT 12/03/2020, 1:06 PM  [place] MAIN [facility] SERVICES [place], [place], [address] Phone: [phone]   Fax:  [phone]  Name: Sherry [name] MRN: [id number] Date of Birth: 02-07-1934

## 2020-12-08 ENCOUNTER — Ambulatory Visit: Payer: Medicare HMO

## 2020-12-08 ENCOUNTER — Ambulatory Visit: Payer: Medicare HMO | Admitting: Occupational Therapy

## 2020-12-08 ENCOUNTER — Encounter: Payer: Self-pay | Admitting: Occupational Therapy

## 2020-12-08 ENCOUNTER — Other Ambulatory Visit: Payer: Self-pay

## 2020-12-08 DIAGNOSIS — R262 Difficulty in walking, not elsewhere classified: Secondary | ICD-10-CM

## 2020-12-08 DIAGNOSIS — M6281 Muscle weakness (generalized): Secondary | ICD-10-CM | POA: Diagnosis not present

## 2020-12-08 DIAGNOSIS — R269 Unspecified abnormalities of gait and mobility: Secondary | ICD-10-CM

## 2020-12-08 DIAGNOSIS — R2681 Unsteadiness on feet: Secondary | ICD-10-CM

## 2020-12-08 NOTE — Therapy (Signed)
[place] MAIN [facility] SERVICES [address] [place], [place], [address] Phone: [phone]   Fax:  [phone]  Occupational Therapy Treatment  Patient Details  Name: Sherry [name] MRN: [id number] Date of Birth: March 27, 1934 No data recorded  Encounter Date: 12/08/2020   OT End of Session - 12/08/20 1111     Visit Number 32    Number of Visits 96    Date for OT Re-Evaluation 01/12/21    Authorization Time Period Progress report period starting 11/26/2020    OT Start Time 1100    OT Stop Time 1145    OT Time Calculation (min) 45 min    Activity Tolerance Patient tolerated treatment well    Behavior During Therapy [facility] for tasks assessed/performed             Past Medical History:  Diagnosis Date   Acute blood loss anemia    Arthritis    Cancer ([place])    skin   Central cord syndrome at C6 level of cervical spinal cord ([place]) 11/29/2017   Hypertension    Protein-calorie malnutrition, severe ([place]) 01/24/2018   S/P insertion of IVC (inferior vena caval) filter 01/24/2018   Tetraparesis ([place])     Past Surgical History:  Procedure Laterality Date   ANTERIOR CERVICAL DECOMP/DISCECTOMY FUSION N/A 11/29/2017   Procedure: Cervical five-six, six-seven Anterior Cervical Decompression Fusion;  Surgeon: Judith Part, MD;  Location: [place];  Service: Neurosurgery;  Laterality: N/A;  Cervical five-six, six-seven Anterior Cervical Decompression Fusion   CATARACT EXTRACTION     IR IVC FILTER PLMT / S&I /IMG GUID/MOD SED  12/08/2017    There were no vitals filed for this visit.   Subjective Assessment - 12/08/20 1110     Subjective  Pt. reports that her foot is feeling much better.    Patient is accompanied by: Family member    Pertinent History Patient s/p fall November 29, 2017 resulting in diagnosis of central cord syndrome at C 6 level.  She has had therapy in multiple venues but with recent move to [facility], therapy staff  recommended she seek outpatient therapy for her needs.    Currently in Pain? No/denies            OT TREATMENT     Pt. tolerated bilateral shoulder flexion, extension, abduction, elbow flexion, extension forearm supination AAROM/AROM. PROM bilateral wrist extension, digit MP, PIP, and DIP flexion, and extension, thumb radial, and palmar abduction in conjunction with moist heat. Pt. worked on [employer], and reciprocal motion using the UBE while sitting for 8 min. with minimal resistance. Constant monitoring was provided.   Pt. continues to make progress overall, and is improving with BUE reaching in preparation for improving overhead ADLs, and haircare.  Pt. was able to tolerate the UBE for 5 min. with the UBE elevated for increased ROM. Pt. tolerated ROM well without reports of pain, or discomfort. Pt. Continues to present with bilateral wrist flexor tightness, and digit extensor tightness. Pt. continues to benefit from working on impoving ROM in order to work towards increasing engagement of her bilateral hands during ADLs, and IADL tasks.                           OT Education - 12/08/20 1111     Education Details BUE functioning    Person(s) Educated Patient;Caregiver(s)    Methods Explanation    Comprehension Verbalized understanding;Need further instruction  OT Long Term Goals - 10/20/20 1302       OT LONG TERM GOAL #1   Title Patient and caregiver will demonstrate understanding of home exercise program for ROM.    Baseline Pt. continues to have a restorative aide rehab aide assist her wih exercises at [facility]. 8/8: Independently completes/directs HEP mulitple times/week    Time 12    Period Weeks    Status Partially Met    Target Date 01/12/21      OT LONG TERM GOAL #4   Title Patient will demonstrate improved composite finger flexion to hold deodorant to apply to underarms.    Baseline 10/20/2020: Pt. conitnues to make  steady progress with bilateral digit flexion.Pt. conitnues to present with stiifness/tightness which is hindering pt.'s ability to formulate a full composite fist. Pt. continues to present with improving finger flexion of R hand for a partial gross grasp, but unable to grasp and hold items. pt. conitnues to present with limited digit flexion of L hand. Pt. has active left thumb abduction. 8/8: holds cup with 2 hands, continues to have limited composite finger flexion L worse than R    Time 12    Period Weeks    Status On-going    Target Date 01/12/21      OT LONG TERM GOAL #6   Title Patient will increase right UE ROM to comb the back of her hair with modified independence.    Baseline 10/20/2020: Pt. continues to progress wtith RRUE ROM, and is able to use a pic for the back of her head, however is unable to brush the back of her hair. 07/30/2020: Pt. is now able to comb the back of her hair with a pic, however continues to work on completing the task efficiently. 8/8: Achieves with great effort, has assistance for efficiancy    Time 12    Period Weeks    Status Partially Met    Target Date 01/12/21      OT LONG TERM GOAL #7   Title Pt. will independently, and efficiently send text messages on her phone.    Baseline 10/20/2020: Pt. is now able to type a text message on the phone, however not efficiently taking increased time to complete. 07/30/2020: Pt. continues to present with limited BUE strength, and [facility] skills. 8/8: independtly drafts text messages, continues to work on accuracy and efficiancy 2/2 limited [facility]    Time 12    Period Weeks    Status Partially Met    Target Date 01/12/21      OT LONG TERM GOAL #8   Title Pt. will write, and sign her name with 100% legibility, and modified independence.    Baseline 10/20/2020: Pt. continues to make progress overall. Pt. continues to consistently fill out her daily menu, and writing for crossword puzzles. Pt. is able to maintain grasp on a wide  width pen. [place] to work on [employer]. 8/8: writes daily, continues to work on improving speed    Time 12    Period Weeks    Status Partially Met    Target Date 01/12/21                   Plan - 12/08/20 1111     Clinical Impression Statement Pt. continues to make progress overall, and is improving with BUE reaching in preparation for improving overhead ADLs, and haircare.  Pt. was able to tolerate the UBE for 5 min. with the UBE elevated for  increased ROM. Pt. tolerated ROM well without reports of pain, or discomfort. Pt. Continues to present with bilateral wrist flexor tightness, and digit extensor tightness. Pt. continues to benefit from working on impoving ROM in order to work towards increasing engagement of her bilateral hands during ADLs, and IADL tasks.        OT Occupational Profile and History Detailed Assessment- Review of Records and additional review of physical, cognitive, psychosocial history related to current functional performance    Occupational performance deficits (Please refer to evaluation for details): ADL's;IADL's;Leisure;Social Participation    Body Structure / Function / Physical Skills ADL;Continence;Dexterity;Flexibility;Strength;ROM;Balance;Coordination;FMC;IADL;Endurance;UE functional use;Decreased knowledge of use of DME;GMC    Psychosocial Skills Coping Strategies    Rehab Potential Fair    Clinical Decision Making Several treatment options, min-mod task modification necessary    Comorbidities Affecting Occupational Performance: Presence of comorbidities impacting occupational performance    Comorbidities impacting occupational performance description: contractures of bilateral hands, dependent transfers,    Modification or Assistance to Complete Evaluation  No modification of tasks or assist necessary to complete eval    OT Frequency 2x / week    OT Duration 12 weeks    OT Treatment/Interventions Self-care/ADL  training;Cryotherapy;Therapeutic exercise;DME and/or AE instruction;Balance training;Neuromuscular education;Manual Therapy;Splinting;Moist Heat;Passive range of motion;Therapeutic activities;Patient/family education    Consulted and Agree with Plan of Care Patient             Patient will benefit from skilled therapeutic intervention in order to improve the following deficits and impairments:   Body Structure / Function / Physical Skills: ADL, Continence, Dexterity, Flexibility, Strength, ROM, Balance, Coordination, FMC, IADL, Endurance, UE functional use, Decreased knowledge of use of DME, GMC   Psychosocial Skills: Coping Strategies   Visit Diagnosis: Muscle weakness (generalized)    Problem List Patient Active Problem List   Diagnosis Date Noted   Acute appendicitis with appendiceal abscess 05/30/2018   Hypocalcemia 02/15/2018   Dysuria 02/15/2018   Bilateral lower extremity edema 02/11/2018   Vaginal yeast infection 01/30/2018   Chest tightness 01/27/2018   Healthcare-associated pneumonia 01/25/2018   Pleural effusion, not elsewhere classified 01/25/2018   Acute deep vein thrombosis (DVT) of left lower extremity ([place]) 01/24/2018   Acute deep vein thrombosis (DVT) of right lower extremity ([place]) 01/24/2018   Chronic allergic rhinitis 01/24/2018   Depression with anxiety 01/24/2018   UTI due to Klebsiella species 01/24/2018   Protein-calorie malnutrition, severe ([place]) 01/24/2018   S/P insertion of IVC (inferior vena caval) filter 01/24/2018   Reactive depression    Hypokalemia    Leukocytosis    Essential hypertension    Trauma    Tetraparesis ([facility])    Neuropathic pain    Neurogenic bowel    Neurogenic bladder    Benign essential HTN    Acute blood loss anemia    Central cord syndrome at C6 level of cervical spinal cord ([place]) 11/29/2017   Central cord syndrome ([place]) 11/29/2017    Harrel Carina, MS, OTR/L 12/08/2020, 12:57 PM  [place] MAIN [facility] SERVICES [address] [place], [place], [address] Phone: [phone]   Fax:  [phone]  Name: Sherry [name] MRN: [id number] Date of Birth: 01-01-1935

## 2020-12-08 NOTE — Therapy (Addendum)
[place] [facility] SERVICES [address] [place], [place], [address] Phone: [phone]   Fax:  [phone]  Physical Therapy Treatment  Patient Details  Name: Sherry [name] MRN: [id number] Date of Birth: 1934/08/18 No data recorded  Encounter Date: 12/08/2020   PT End of Session - 12/08/20 1229     Visit Number 107    Number of Visits 113    Date for PT Re-Evaluation 12/17/20    Authorization Type [employer] FOTO performed by PT on eval (7/20), score 12, Progress note on 03/10/2020; PN on 08/20/2020    Authorization Time Period Recert 08/04/1285- 8/67/6720; PN on 10/06/7094; Recert 2/83/6629- 47/65/4650; PN on 10/01/2020    PT Start Time 1143    PT Stop Time 1226    PT Time Calculation (min) 43 min    Equipment Utilized During Treatment Gait belt    Activity Tolerance Patient tolerated treatment well;Patient limited by fatigue;Patient limited by pain    Behavior During Therapy WFL for tasks assessed/performed             Past Medical History:  Diagnosis Date   Acute blood loss anemia    Arthritis    Cancer ([place])    skin   Central cord syndrome at C6 level of cervical spinal cord ([place]) 11/29/2017   Hypertension    Protein-calorie malnutrition, severe ([place]) 01/24/2018   S/P insertion of IVC (inferior vena caval) filter 01/24/2018   Tetraparesis ([place])     Past Surgical History:  Procedure Laterality Date   ANTERIOR CERVICAL DECOMP/DISCECTOMY FUSION N/A 11/29/2017   Procedure: Cervical five-six, six-seven Anterior Cervical Decompression Fusion;  Surgeon: Judith Part, MD;  Location: [place];  Service: Neurosurgery;  Laterality: N/A;  Cervical five-six, six-seven Anterior Cervical Decompression Fusion   CATARACT EXTRACTION     IR IVC FILTER PLMT / S&I /IMG GUID/MOD SED  12/08/2017    There were no vitals filed for this visit.   Subjective Assessment - 12/08/20 1148     Subjective Pt reports her ankle is doing much  better and denies any pain. She has a follow up appointment next week with her physician to assess the ankle.    Patient is accompained by: --   [name] (aide)   Pertinent History Pt is an 86 yo female that fell in 2019, fractured vertebrae in her neck and in her low back per family. 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 After hospital stay, pt discharged to [facility] for ~1 month, experienced severe UTI as well as bilateral DVT (IVC filter placed). Discharged to [facility], stayed for about a year, and then moved to [facility] in April 2021. Was receiving PT, but per family facility reported that did not have adequate equipment to maximize PT for her. Pt until about 1 month ago was practicing sit to stand transfers with 1-2 people, and working on static standing. Has a brace for L foot due to PF. Pt currently needs assistance with all ADLs (able to assist with donning/doffing her shirt), bed baths, and needs a hoyer lift for transfers. Able to drive power wheelchair without assistance.    Limitations Standing;Walking;House hold activities;Lifting    How long can you sit comfortably? NA    How long can you stand comfortably? unable    How long can you walk comfortably? unable    Patient Stated Goals "to stand up and walk"    Pain Onset In the past 7 days                *  Patient remains Left LE NWB due to recent ankle fracture.    Intervention:   Seated Hip march- AAROM 2 x 15 reps each Seated Knee ext w/ 3 second hold - AROM on left and AAROM on right LE. 2 sets of 15 reps. VC on slow descent for improved eccentric control. *2nd set - AROM on first 10 reps on right, AAROM for the last 5 reps. Abdominal sit up - (chair reclined for optimal ROM) 2 sets of 15 reps   Gluteal sets - Hold 5 sec x 10 reps x 1set Heel slides BLE x 15 reps (AAROM for improved range)    Seated dead lift/Lumbar ext with green yoga resistive band x 12 reps with VC and TC for technique.    Hip  abduction with min physical Assistance B LE x 15 reps   Quad sets LLE with 5 sec hold x 10 reps  Modified leg press in seated with RLE x 15 reps   Education provided throughout session via VC/TC and demonstration to facilitate movement at target joints and correct muscle activation for all  exercises performed.    Clinical Impression: Patient presents with good motivation and no pain. She responds well to all interventions with no pain reported.  Pt shows improved right quad facilitation and eccentric control  and is requiring decreased assistance with AAROM exercises. Pt will continue to progress strength toward weight bearing when cleared by her doctor. Pt will benefit from skilled physical therapy to increase mobility and strength to improve patient's quality of life.                            PT Education - 12/08/20 1300     Education Details exercise technique    Person(s) Educated Patient    Methods Explanation;Demonstration;Tactile cues;Verbal cues    Comprehension Verbalized understanding;Returned demonstration;Need further instruction;Verbal cues required;Tactile cues required              PT Short Term Goals - 03/10/20 2211       PT SHORT TERM GOAL #1   Title The patient will perform initial HEP with minimum assistance in order to improve strength and function.    Baseline Patient demonstrating independence with initial HEP as of 03/10/2020 with no questions or difficulty.    Time 4    Period Weeks    Status Achieved    Target Date 02/11/20               PT Long Term Goals - 11/12/20 1148       PT LONG TERM GOAL #1   Title The patient will be compliant with finalized HEP with minimum assistance in preparation for self management and maintenance of condition.    Baseline 03/10/2020- Patient familiar with initial HEP and will keep goal active as HEP becomes more progressive including possible transfers and standing activities. 04/07/2020-  Patient continues to report compliance with LE strengthening home exercises without questions or concerns. 04/21/2020- Patient able to verbalize and demonstrate good understanding of current HEP including some supine and seated LE strengthening exercises. Reviewed and patient performed 10 reps today with minimal cueing. Will keep goal active to progress HEP as appropriate. 05/28/2020- patient reports continues to perform LE strengthening as able and some help from caregiver as able. No changes at this time to home program as patient is limited to supine/seated therex. 07/02/2020- Patient continues to report compliance with current supine and seated LE  strengtheing home program and states no questions or concerns regarding her plan.    Time 12    Period Weeks    Status Achieved      PT LONG TERM GOAL #2   Title The patient will demonstrate at least 10 point improvement on FOTO score indicating an improved ability to perform functional activities.    Baseline on eval (/720) score 12; 10/22/19: 12; 12/10/19: 18, 12/13: 18: 04/07/2020- Will assess next visit. 07/02/2020- Will obtain next visit. 09/24/2020= Will obtain next visit. 11/12/2020= 12    Time 12    Period Weeks    Status On-going    Target Date 12/17/20      PT LONG TERM GOAL #3   Title The patient will demonstrate lateral scooting  for assistance with transfers with minA to maximize independence.    Baseline 10/22/19: Pt requires modA+1 for lateral scooting; 12/10/19: modA+1, 12/13: modA-maxA, 12/27: modA-maxA, 03/10/2020- ModA-MaxA- increased verbal cues and visual demonstration. 04/07/2020- Continued Max assist to perform forward/lateral scoot. 04/21/2020- Patient was able to demo slight lateral scoot with CGA approx 12 in then fatigued requiring max assist- she is able to scoot forward by leaning without significant issues. 05/28/2020- While sitting in chair- patient able to perform forward and lateral scoot with VC/visual demo and increased time allowed-  CGA today but not consistent yet- will keep goal active at this time to continue to focus on strengthening and improving technique. 07/02/2020- Patient able to demonstrate minimal ability to laterally shift weigh on mat table requiring max assist yet is able to forward scoot out to edge of mat table with min Assist. 08/20/2020 - Patient was able to demonstrate minimal lateral scooting at edge of mat while holding feet still so they did not slip on tile surface. She continues to be able to demonstrate scoot forward and uses tilt option to recline to position safely back into seat of power wheelchair. 09/25/2020- Patient able to sit on mat and minimally scoot laterally but requires min/mod A for efficient scooting. Will end goal at this time due to plateau in progress with scooting over extended time.    Time 12    Period Weeks    Status Not Met      PT LONG TERM GOAL #4   Title The patient will demonstrate a squat pivot transfer with minimum assistance to maximize independence and mobility.    Baseline 10/22/19: unable to safely attempt at this time; 12/10/19: unable to safely attempt at this time, 01/14/20: unable to safety attempt at this time. 03/10/2020- Patient able to perform Stand pivot transfer with Maximal assist. 04/07/2020- Patient continues to require Max assist with sit to stand- will assist SPT next visit. 04/21/2020- Patient presents with Max assist to perform Sit to stand Transfer- unable to pivot or move Left LE well without difficulty. 07/02/2020= Max assist with SPT and minimal ability to pivot. 7/202/2022-Patient able to stand with minimal assist this visit (as good as CGA last visit) and able to move right foot to pivot and performing with moderate assist today. 09/24/2020- Patient able to perform sit to stand with Min assist from lift chair. Patient has demo inconsistency and will keep goal active.    Time 12    Period Weeks    Status On-going      PT LONG TERM GOAL #5   Title The patient will  demonstrate sitting without UE support for 2-5 minutes at EOB to improve participation and maximize independence with ADLs.  Baseline 10/22/19: Pt able to sit unsupported for at least 2 minutes at edge of bed. 04/21/2020- Patient continues to demo good sitting at edge of mat > 5 miin without difficulty or back support.    Time 12    Period Weeks    Status Achieved      PT LONG TERM GOAL #6   Title Patient will demonstrate improved functional LE strength as seen by consistent ability to stand > 2 min (3 out of 4 trials) with Mod/Max A for improved LE strength with transfers.    Baseline 05/28/2020- Patient able to inconsistent stand 1-2 min right now in //bars with Mod/Max A. 07/02/2020- Patient was able to progress last 2 visit to standing using bilateral platform attachment between 1-2 min which is a progression from the parallel bars. 08/20/2020- Patient performed static standing with min- mod A using bilateral platform attachment on walker for 6 min 30 sec today with assist for trunk control and intermittent verbal cues for posture. 09/24/2020- Patient has consistently been able to stand >2 min in // bars and when using bilateral Platform walker.    Time 12    Period Weeks    Status Achieved      PT LONG TERM GOAL #7   Title Patient/caregiver will demonstrate assisted transfer using [place] consistently Independently for improved ability to transfer at home from lift chair to bed.    Baseline 09/24/2020- Patient requires hoyer lift (Dependent) for safe transfers.    Time 12    Period Weeks    Status New                   Plan - 12/08/20 1301     Clinical Impression Statement Patient presents with good motivation and no pain. She responds well to all interventions with no pain reported.  Pt shows improved right quad facilitation and eccentric control  and is requiring decreased assistance with AAROM exercises. Pt will continue to progress strength toward weight bearing when cleared  by her doctor. Pt will benefit from skilled physical therapy to increase mobility and strength to improve patient's quality of life.    Personal Factors and Comorbidities Age;Time since onset of injury/illness/exacerbation;Comorbidity 3+;Fitness    Comorbidities HTN, quadriparesis, history of DVT, neurogenic bladder    Examination-Activity Limitations Bathing;Hygiene/Grooming;Squat;Bed Mobility;Lift;Bend;Stand;Engineer, manufacturing;Toileting;Self Feeding;Transfers;Continence;Sit;Dressing;Sleep;Carry    Examination-Participation Restrictions Church;Laundry;Cleaning;Medication Management;Community Activity;Meal Prep;Interpersonal Relationship    Stability/Clinical Decision Making Evolving/Moderate complexity    Rehab Potential Fair    PT Frequency 2x / week    PT Duration 12 weeks    PT Treatment/Interventions ADLs/Self Care Home Management;Electrical Stimulation;Therapeutic activities;Wheelchair mobility training;Vasopneumatic Device;Joint Manipulations;Vestibular;Passive range of motion;Patient/family education;Therapeutic exercise;DME Instruction;Biofeedback;Aquatic Therapy;Moist Heat;Gait training;Balance training;Orthotic Fit/Training;Dry needling;Energy conservation;Taping;Splinting;Neuromuscular re-education;Cryotherapy;Ultrasound;Functional mobility training    PT Next Visit Plan Continue with progressive standing/transfer training.    PT Home Exercise Plan no changes    Consulted and Agree with Plan of Care Patient             Patient will benefit from skilled therapeutic intervention in order to improve the following deficits and impairments:  Abnormal gait, Decreased balance, Decreased endurance, Decreased mobility, Difficulty walking, Hypomobility, Impaired sensation, Decreased range of motion, Improper body mechanics, Impaired perceived functional ability, Decreased activity tolerance, Decreased knowledge of use of DME, Decreased safety awareness, Decreased strength, Impaired  flexibility, Impaired UE functional use, Postural dysfunction  Visit Diagnosis: Abnormality of gait and mobility  Difficulty in walking, not elsewhere classified  Muscle weakness (generalized)  Unsteadiness  on feet     Problem List Patient Active Problem List   Diagnosis Date Noted   Acute appendicitis with appendiceal abscess 05/30/2018   Hypocalcemia 02/15/2018   Dysuria 02/15/2018   Bilateral lower extremity edema 02/11/2018   Vaginal yeast infection 01/30/2018   Chest tightness 01/27/2018   Healthcare-associated pneumonia 01/25/2018   Pleural effusion, not elsewhere classified 01/25/2018   Acute deep vein thrombosis (DVT) of left lower extremity ([place]) 01/24/2018   Acute deep vein thrombosis (DVT) of right lower extremity ([place]) 01/24/2018   Chronic allergic rhinitis 01/24/2018   Depression with anxiety 01/24/2018   UTI due to Klebsiella species 01/24/2018   Protein-calorie malnutrition, severe ([place]) 01/24/2018   S/P insertion of IVC (inferior vena caval) filter 01/24/2018   Reactive depression    Hypokalemia    Leukocytosis    Essential hypertension    Trauma    Tetraparesis ([facility])    Neuropathic pain    Neurogenic bowel    Neurogenic bladder    Benign essential HTN    Acute blood loss anemia    Central cord syndrome at C6 level of cervical spinal cord ([place]) 11/29/2017   Central cord syndrome ([place]) 11/29/2017    Karn Cassis, SPT This entire session was performed under direct supervision and direction of a licensed therapist/therapist assistant . I have personally read, edited and approve of the note as written.   Ollen Bowl, PT  12/08/2020, 2:05 PM  [place] MAIN [facility] SERVICES [address] [place], [place], [address] Phone: [phone]   Fax:  [phone]  Name: Sherry [name] MRN: [id number] Date of Birth: May 01, 1934

## 2020-12-10 ENCOUNTER — Ambulatory Visit: Payer: Medicare HMO | Admitting: Occupational Therapy

## 2020-12-10 ENCOUNTER — Ambulatory Visit: Payer: Medicare HMO

## 2020-12-10 ENCOUNTER — Other Ambulatory Visit: Payer: Self-pay

## 2020-12-10 ENCOUNTER — Encounter: Payer: Self-pay | Admitting: Occupational Therapy

## 2020-12-10 DIAGNOSIS — M6281 Muscle weakness (generalized): Secondary | ICD-10-CM

## 2020-12-10 DIAGNOSIS — R262 Difficulty in walking, not elsewhere classified: Secondary | ICD-10-CM

## 2020-12-10 DIAGNOSIS — R2681 Unsteadiness on feet: Secondary | ICD-10-CM

## 2020-12-10 DIAGNOSIS — R269 Unspecified abnormalities of gait and mobility: Secondary | ICD-10-CM

## 2020-12-10 NOTE — Therapy (Signed)
[place] MAIN [facility] SERVICES [address] [place], [place], [address] Phone: [phone]   Fax:  [phone]  Occupational Therapy Treatment  Patient Details  Name: Sherry [name] MRN: [id number] Date of Birth: Sep 01, 1934 No data recorded  Encounter Date: 12/10/2020   OT End of Session - 12/10/20 1108     Visit Number 16    Number of Visits 96    Date for OT Re-Evaluation 01/12/21    Authorization Time Period Progress report period starting 11/26/2020    OT Start Time 20    OT Stop Time 1145    OT Time Calculation (min) 45 min    Equipment Utilized During Treatment moist heat    Activity Tolerance Patient tolerated treatment well    Behavior During Therapy [facility] for tasks assessed/performed             Past Medical History:  Diagnosis Date   Acute blood loss anemia    Arthritis    Cancer ([place])    skin   Central cord syndrome at C6 level of cervical spinal cord ([place]) 11/29/2017   Hypertension    Protein-calorie malnutrition, severe ([place]) 01/24/2018   S/P insertion of IVC (inferior vena caval) filter 01/24/2018   Tetraparesis ([place])     Past Surgical History:  Procedure Laterality Date   ANTERIOR CERVICAL DECOMP/DISCECTOMY FUSION N/A 11/29/2017   Procedure: Cervical five-six, six-seven Anterior Cervical Decompression Fusion;  Surgeon: Judith Part, MD;  Location: [place];  Service: Neurosurgery;  Laterality: N/A;  Cervical five-six, six-seven Anterior Cervical Decompression Fusion   CATARACT EXTRACTION     IR IVC FILTER PLMT / S&I /IMG GUID/MOD SED  12/08/2017    There were no vitals filed for this visit.   Subjective Assessment - 12/10/20 1108     Subjective  Pt. reports that her foot is feeling much better.    Patient is accompanied by: Family member    Pertinent History Patient s/p fall November 29, 2017 resulting in diagnosis of central cord syndrome at C 6 level.  She has had therapy in multiple venues but with  recent move to [facility], therapy staff recommended she seek outpatient therapy for her needs.    Currently in Pain? No/denies            OT TREATMENT     Pt. tolerated bilateral shoulder flexion, extension, abduction, elbow flexion, extension forearm supination AAROM/AROM. PROM bilateral wrist extension, digit MP, PIP, and DIP flexion, and extension, thumb radial, and palmar abduction in conjunction with moist heat. Pt. worked on [employer], and reciprocal motion using the UBE while sitting for 8 min. with minimal resistance. Constant monitoring was provided.   Pt. continues to make progress overall, and is improving with BUE reaching in preparation for improving overhead ADLs, and haircare.  Pt. was able to tolerate the UBE for 8 min. with the UBE elevated for increased ROM. Pt. tolerated ROM well without reports of pain, or discomfort. Pt. continues to present with bilateral wrist flexor tightness, and digit extensor tightness. Pt. continues to benefit from working on impoving ROM in order to work towards increasing engagement of her bilateral hands during ADLs, and IADL tasks.                         OT Education - 12/10/20 1108     Education Details BUE functioning    Person(s) Educated Patient;Caregiver(s)    Methods Explanation  Comprehension Verbalized understanding;Need further instruction                 OT Long Term Goals - 10/20/20 1302       OT LONG TERM GOAL #1   Title Patient and caregiver will demonstrate understanding of home exercise program for ROM.    Baseline Pt. continues to have a restorative aide rehab aide assist her wih exercises at [facility]. 8/8: Independently completes/directs HEP mulitple times/week    Time 12    Period Weeks    Status Partially Met    Target Date 01/12/21      OT LONG TERM GOAL #4   Title Patient will demonstrate improved composite finger flexion to hold deodorant to apply to underarms.     Baseline 10/20/2020: Pt. conitnues to make steady progress with bilateral digit flexion.Pt. conitnues to present with stiifness/tightness which is hindering pt.'s ability to formulate a full composite fist. Pt. continues to present with improving finger flexion of R hand for a partial gross grasp, but unable to grasp and hold items. pt. conitnues to present with limited digit flexion of L hand. Pt. has active left thumb abduction. 8/8: holds cup with 2 hands, continues to have limited composite finger flexion L worse than R    Time 12    Period Weeks    Status On-going    Target Date 01/12/21      OT LONG TERM GOAL #6   Title Patient will increase right UE ROM to comb the back of her hair with modified independence.    Baseline 10/20/2020: Pt. continues to progress wtith RRUE ROM, and is able to use a pic for the back of her head, however is unable to brush the back of her hair. 07/30/2020: Pt. is now able to comb the back of her hair with a pic, however continues to work on completing the task efficiently. 8/8: Achieves with great effort, has assistance for efficiancy    Time 12    Period Weeks    Status Partially Met    Target Date 01/12/21      OT LONG TERM GOAL #7   Title Pt. will independently, and efficiently send text messages on her phone.    Baseline 10/20/2020: Pt. is now able to type a text message on the phone, however not efficiently taking increased time to complete. 07/30/2020: Pt. continues to present with limited BUE strength, and [facility] skills. 8/8: independtly drafts text messages, continues to work on accuracy and efficiancy 2/2 limited [facility]    Time 12    Period Weeks    Status Partially Met    Target Date 01/12/21      OT LONG TERM GOAL #8   Title Pt. will write, and sign her name with 100% legibility, and modified independence.    Baseline 10/20/2020: Pt. continues to make progress overall. Pt. continues to consistently fill out her daily menu, and writing for crossword puzzles.  Pt. is able to maintain grasp on a wide width pen. [place] to work on [employer]. 8/8: writes daily, continues to work on improving speed    Time 12    Period Weeks    Status Partially Met    Target Date 01/12/21                   Plan - 12/10/20 1109     Clinical Impression Statement Pt. continues to make progress overall, and is improving with BUE reaching in preparation for  improving overhead ADLs, and haircare.  Pt. was able to tolerate the UBE for 8 min. with the UBE elevated for increased ROM. Pt. tolerated ROM well without reports of pain, or discomfort. Pt. continues to present with bilateral wrist flexor tightness, and digit extensor tightness. Pt. continues to benefit from working on impoving ROM in order to work towards increasing engagement of her bilateral hands during ADLs, and IADL tasks.       OT Occupational Profile and History Detailed Assessment- Review of Records and additional review of physical, cognitive, psychosocial history related to current functional performance    Occupational performance deficits (Please refer to evaluation for details): ADL's;IADL's;Leisure;Social Participation    Body Structure / Function / Physical Skills ADL;Continence;Dexterity;Flexibility;Strength;ROM;Balance;Coordination;FMC;IADL;Endurance;UE functional use;Decreased knowledge of use of DME;GMC    Psychosocial Skills Coping Strategies    Rehab Potential Fair    Clinical Decision Making Several treatment options, min-mod task modification necessary    Comorbidities Affecting Occupational Performance: Presence of comorbidities impacting occupational performance    Comorbidities impacting occupational performance description: contractures of bilateral hands, dependent transfers,    Modification or Assistance to Complete Evaluation  No modification of tasks or assist necessary to complete eval    OT Frequency 2x / week    OT Duration 12 weeks    OT Treatment/Interventions  Self-care/ADL training;Cryotherapy;Therapeutic exercise;DME and/or AE instruction;Balance training;Neuromuscular education;Manual Therapy;Splinting;Moist Heat;Passive range of motion;Therapeutic activities;Patient/family education    Consulted and Agree with Plan of Care Patient             Patient will benefit from skilled therapeutic intervention in order to improve the following deficits and impairments:   Body Structure / Function / Physical Skills: ADL, Continence, Dexterity, Flexibility, Strength, ROM, Balance, Coordination, FMC, IADL, Endurance, UE functional use, Decreased knowledge of use of DME, GMC   Psychosocial Skills: Coping Strategies   Visit Diagnosis: Muscle weakness (generalized)    Problem List Patient Active Problem List   Diagnosis Date Noted   Acute appendicitis with appendiceal abscess 05/30/2018   Hypocalcemia 02/15/2018   Dysuria 02/15/2018   Bilateral lower extremity edema 02/11/2018   Vaginal yeast infection 01/30/2018   Chest tightness 01/27/2018   Healthcare-associated pneumonia 01/25/2018   Pleural effusion, not elsewhere classified 01/25/2018   Acute deep vein thrombosis (DVT) of left lower extremity ([place]) 01/24/2018   Acute deep vein thrombosis (DVT) of right lower extremity ([place]) 01/24/2018   Chronic allergic rhinitis 01/24/2018   Depression with anxiety 01/24/2018   UTI due to Klebsiella species 01/24/2018   Protein-calorie malnutrition, severe ([place]) 01/24/2018   S/P insertion of IVC (inferior vena caval) filter 01/24/2018   Reactive depression    Hypokalemia    Leukocytosis    Essential hypertension    Trauma    Tetraparesis ([facility])    Neuropathic pain    Neurogenic bowel    Neurogenic bladder    Benign essential HTN    Acute blood loss anemia    Central cord syndrome at C6 level of cervical spinal cord ([place]) 11/29/2017   Central cord syndrome ([place]) 11/29/2017    Harrel Carina, MS, OTR/L 12/10/2020, 11:10 AM  [place], [place], [address] Phone: [phone]   Fax:  [phone]  Name: ROCSI HAZELBAKER MRN: [id number] Date of Birth: 1934/05/03

## 2020-12-10 NOTE — Therapy (Signed)
[place] MAIN [facility] SERVICES [address] [place], [place], [address] Phone: [phone]   Fax:  [phone]  Physical Therapy Treatment Patient Details  Name: Sherry [name] MRN: [id number] Date of Birth: 02-11-1934 No data recorded  Encounter Date: 12/10/2020   PT End of Session - 12/10/20 1532     Visit Number 108    Number of Visits 113    Date for PT Re-Evaluation 12/17/20    Authorization Type [employer] FOTO performed by PT on eval (7/20), score 12, Progress note on 03/10/2020; PN on 08/20/2020    Authorization Time Period Recert 5/0/0370- 4/88/8916; PN on 10/05/5036; Recert 8/82/8003- 49/17/9150; PN on 10/01/2020    PT Start Time 1144    PT Stop Time 1224    PT Time Calculation (min) 40 min    Equipment Utilized During Treatment Gait belt    Activity Tolerance Patient tolerated treatment well;Patient limited by fatigue;Patient limited by pain    Behavior During Therapy WFL for tasks assessed/performed             Past Medical History:  Diagnosis Date   Acute blood loss anemia    Arthritis    Cancer ([place])    skin   Central cord syndrome at C6 level of cervical spinal cord ([place]) 11/29/2017   Hypertension    Protein-calorie malnutrition, severe ([place]) 01/24/2018   S/P insertion of IVC (inferior vena caval) filter 01/24/2018   Tetraparesis ([place])     Past Surgical History:  Procedure Laterality Date   ANTERIOR CERVICAL DECOMP/DISCECTOMY FUSION N/A 11/29/2017   Procedure: Cervical five-six, six-seven Anterior Cervical Decompression Fusion;  Surgeon: Judith Part, MD;  Location: [place];  Service: Neurosurgery;  Laterality: N/A;  Cervical five-six, six-seven Anterior Cervical Decompression Fusion   CATARACT EXTRACTION     IR IVC FILTER PLMT / S&I /IMG GUID/MOD SED  12/08/2017    There were no vitals filed for this visit.    *Patient remains Left LE NWB due to recent ankle fracture.    Intervention:    Seated Hip  march- AROM x 15 reps each LE. Then 2nd set with AAROM x 15 reps  Seated Knee ext w/ 3 second hold - AROM on left and AAROM on right LE. 2 sets of 15 reps. VC on slow descent for improved eccentric control. *2nd set - AROM on first 10 reps on right, AAROM for the last 5 reps. Abdominal sit up - (chair reclined for optimal ROM) 2 sets of 15 reps    Gluteal sets - Hold 5 sec x 10 reps x 1set Heel slides BLE x 15 reps (AAROM for improved range)    Hip abduction with min physical Assistance B LE x 15 reps   Quad sets LLE with 5 sec hold x 10 reps   Seated Scap retraction- Active x 15 reps- Good activation of rhomboids with good form with Vcs.  Progressed to resistive Yellow TB 2 sets of 15 reps.    Education provided throughout session via VC/TC and demonstration to facilitate movement at target joints and correct muscle activation for all  exercises performed.    Clinical Impression: Patient continues to perform well with increased Right quad activity with quad sets and LAQ. She is able to perform some of LE actively with limited ROM due to weakness but improving initiation of movement. Pt will continue to progress strength toward weight bearing when cleared by her doctor. Pt will benefit from skilled physical therapy  to increase mobility and strength to improve patient's quality of life.                                   PT Short Term Goals - 03/10/20 2211       PT SHORT TERM GOAL #1   Title The patient will perform initial HEP with minimum assistance in order to improve strength and function.    Baseline Patient demonstrating independence with initial HEP as of 03/10/2020 with no questions or difficulty.    Time 4    Period Weeks    Status Achieved    Target Date 02/11/20               PT Long Term Goals - 11/12/20 1148       PT LONG TERM GOAL #1   Title The patient will be compliant with finalized HEP with minimum assistance in preparation for self  management and maintenance of condition.    Baseline 03/10/2020- Patient familiar with initial HEP and will keep goal active as HEP becomes more progressive including possible transfers and standing activities. 04/07/2020- Patient continues to report compliance with LE strengthening home exercises without questions or concerns. 04/21/2020- Patient able to verbalize and demonstrate good understanding of current HEP including some supine and seated LE strengthening exercises. Reviewed and patient performed 10 reps today with minimal cueing. Will keep goal active to progress HEP as appropriate. 05/28/2020- patient reports continues to perform LE strengthening as able and some help from caregiver as able. No changes at this time to home program as patient is limited to supine/seated therex. 07/02/2020- Patient continues to report compliance with current supine and seated LE strengtheing home program and states no questions or concerns regarding her plan.    Time 12    Period Weeks    Status Achieved      PT LONG TERM GOAL #2   Title The patient will demonstrate at least 10 point improvement on FOTO score indicating an improved ability to perform functional activities.    Baseline on eval (/720) score 12; 10/22/19: 12; 12/10/19: 18, 12/13: 18: 04/07/2020- Will assess next visit. 07/02/2020- Will obtain next visit. 09/24/2020= Will obtain next visit. 11/12/2020= 12    Time 12    Period Weeks    Status On-going    Target Date 12/17/20      PT LONG TERM GOAL #3   Title The patient will demonstrate lateral scooting  for assistance with transfers with minA to maximize independence.    Baseline 10/22/19: Pt requires modA+1 for lateral scooting; 12/10/19: modA+1, 12/13: modA-maxA, 12/27: modA-maxA, 03/10/2020- ModA-MaxA- increased verbal cues and visual demonstration. 04/07/2020- Continued Max assist to perform forward/lateral scoot. 04/21/2020- Patient was able to demo slight lateral scoot with CGA approx 12 in then fatigued  requiring max assist- she is able to scoot forward by leaning without significant issues. 05/28/2020- While sitting in chair- patient able to perform forward and lateral scoot with VC/visual demo and increased time allowed- CGA today but not consistent yet- will keep goal active at this time to continue to focus on strengthening and improving technique. 07/02/2020- Patient able to demonstrate minimal ability to laterally shift weigh on mat table requiring max assist yet is able to forward scoot out to edge of mat table with min Assist. 08/20/2020 - Patient was able to demonstrate minimal lateral scooting at edge of mat while holding feet still so  they did not slip on tile surface. She continues to be able to demonstrate scoot forward and uses tilt option to recline to position safely back into seat of power wheelchair. 09/25/2020- Patient able to sit on mat and minimally scoot laterally but requires min/mod A for efficient scooting. Will end goal at this time due to plateau in progress with scooting over extended time.    Time 12    Period Weeks    Status Not Met      PT LONG TERM GOAL #4   Title The patient will demonstrate a squat pivot transfer with minimum assistance to maximize independence and mobility.    Baseline 10/22/19: unable to safely attempt at this time; 12/10/19: unable to safely attempt at this time, 01/14/20: unable to safety attempt at this time. 03/10/2020- Patient able to perform Stand pivot transfer with Maximal assist. 04/07/2020- Patient continues to require Max assist with sit to stand- will assist SPT next visit. 04/21/2020- Patient presents with Max assist to perform Sit to stand Transfer- unable to pivot or move Left LE well without difficulty. 07/02/2020= Max assist with SPT and minimal ability to pivot. 7/202/2022-Patient able to stand with minimal assist this visit (as good as CGA last visit) and able to move right foot to pivot and performing with moderate assist today. 09/24/2020- Patient  able to perform sit to stand with Min assist from lift chair. Patient has demo inconsistency and will keep goal active.    Time 12    Period Weeks    Status On-going      PT LONG TERM GOAL #5   Title The patient will demonstrate sitting without UE support for 2-5 minutes at EOB to improve participation and maximize independence with ADLs.    Baseline 10/22/19: Pt able to sit unsupported for at least 2 minutes at edge of bed. 04/21/2020- Patient continues to demo good sitting at edge of mat > 5 miin without difficulty or back support.    Time 12    Period Weeks    Status Achieved      PT LONG TERM GOAL #6   Title Patient will demonstrate improved functional LE strength as seen by consistent ability to stand > 2 min (3 out of 4 trials) with Mod/Max A for improved LE strength with transfers.    Baseline 05/28/2020- Patient able to inconsistent stand 1-2 min right now in //bars with Mod/Max A. 07/02/2020- Patient was able to progress last 2 visit to standing using bilateral platform attachment between 1-2 min which is a progression from the parallel bars. 08/20/2020- Patient performed static standing with min- mod A using bilateral platform attachment on walker for 6 min 30 sec today with assist for trunk control and intermittent verbal cues for posture. 09/24/2020- Patient has consistently been able to stand >2 min in // bars and when using bilateral Platform walker.    Time 12    Period Weeks    Status Achieved      PT LONG TERM GOAL #7   Title Patient/caregiver will demonstrate assisted transfer using [place] consistently Independently for improved ability to transfer at home from lift chair to bed.    Baseline 09/24/2020- Patient requires hoyer lift (Dependent) for safe transfers.    Time 12    Period Weeks    Status New                   Plan - 12/10/20 1523     Clinical Impression Statement Patient  continues to perform well with increased Right quad activity with quad sets and  LAQ. She is able to perform some of LE actively with limited ROM due to weakness but improving initiation of movement. Pt will continue to progress strength toward weight bearing when cleared by her doctor. Pt will benefit from skilled physical therapy to increase mobility and strength to improve patient's quality of life.    Personal Factors and Comorbidities Age;Time since onset of injury/illness/exacerbation;Comorbidity 3+;Fitness    Comorbidities HTN, quadriparesis, history of DVT, neurogenic bladder    Examination-Activity Limitations Bathing;Hygiene/Grooming;Squat;Bed Mobility;Lift;Bend;Stand;Engineer, manufacturing;Toileting;Self Feeding;Transfers;Continence;Sit;Dressing;Sleep;Carry    Examination-Participation Restrictions Church;Laundry;Cleaning;Medication Management;Community Activity;Meal Prep;Interpersonal Relationship    Stability/Clinical Decision Making Evolving/Moderate complexity    Rehab Potential Fair    PT Frequency 2x / week    PT Duration 12 weeks    PT Treatment/Interventions ADLs/Self Care Home Management;Electrical Stimulation;Therapeutic activities;Wheelchair mobility training;Vasopneumatic Device;Joint Manipulations;Vestibular;Passive range of motion;Patient/family education;Therapeutic exercise;DME Instruction;Biofeedback;Aquatic Therapy;Moist Heat;Gait training;Balance training;Orthotic Fit/Training;Dry needling;Energy conservation;Taping;Splinting;Neuromuscular re-education;Cryotherapy;Ultrasound;Functional mobility training    PT Next Visit Plan Continue with progressive standing/transfer training.    PT Home Exercise Plan no changes    Consulted and Agree with Plan of Care Patient             Patient will benefit from skilled therapeutic intervention in order to improve the following deficits and impairments:  Abnormal gait, Decreased balance, Decreased endurance, Decreased mobility, Difficulty walking, Hypomobility, Impaired sensation, Decreased range of  motion, Improper body mechanics, Impaired perceived functional ability, Decreased activity tolerance, Decreased knowledge of use of DME, Decreased safety awareness, Decreased strength, Impaired flexibility, Impaired UE functional use, Postural dysfunction  Visit Diagnosis: Abnormality of gait and mobility  Difficulty in walking, not elsewhere classified  Muscle weakness (generalized)  Unsteadiness on feet     Problem List Patient Active Problem List   Diagnosis Date Noted   Acute appendicitis with appendiceal abscess 05/30/2018   Hypocalcemia 02/15/2018   Dysuria 02/15/2018   Bilateral lower extremity edema 02/11/2018   Vaginal yeast infection 01/30/2018   Chest tightness 01/27/2018   Healthcare-associated pneumonia 01/25/2018   Pleural effusion, not elsewhere classified 01/25/2018   Acute deep vein thrombosis (DVT) of left lower extremity ([place]) 01/24/2018   Acute deep vein thrombosis (DVT) of right lower extremity ([place]) 01/24/2018   Chronic allergic rhinitis 01/24/2018   Depression with anxiety 01/24/2018   UTI due to Klebsiella species 01/24/2018   Protein-calorie malnutrition, severe ([place]) 01/24/2018   S/P insertion of IVC (inferior vena caval) filter 01/24/2018   Reactive depression    Hypokalemia    Leukocytosis    Essential hypertension    Trauma    Tetraparesis (HCC)    Neuropathic pain    Neurogenic bowel    Neurogenic bladder    Benign essential HTN    Acute blood loss anemia    Central cord syndrome at C6 level of cervical spinal cord ([place]) 11/29/2017   Central cord syndrome ([place]) 11/29/2017    Lewis Moccasin, PT 12/10/2020, 3:34 PM  [place] MAIN [facility] SERVICES [address] [place], [place], [address] Phone: [phone]   Fax:  [phone]  Name: Sherry [name] MRN: [id number] Date of Birth: 1934-06-21

## 2020-12-15 ENCOUNTER — Ambulatory Visit: Payer: Medicare HMO

## 2020-12-15 ENCOUNTER — Encounter: Payer: Self-pay | Admitting: Occupational Therapy

## 2020-12-15 ENCOUNTER — Ambulatory Visit: Payer: Medicare HMO | Admitting: Occupational Therapy

## 2020-12-15 ENCOUNTER — Other Ambulatory Visit: Payer: Self-pay

## 2020-12-15 DIAGNOSIS — G8220 Paraplegia, unspecified: Secondary | ICD-10-CM

## 2020-12-15 DIAGNOSIS — I10 Essential (primary) hypertension: Secondary | ICD-10-CM

## 2020-12-15 DIAGNOSIS — R2681 Unsteadiness on feet: Secondary | ICD-10-CM

## 2020-12-15 DIAGNOSIS — M159 Polyosteoarthritis, unspecified: Secondary | ICD-10-CM

## 2020-12-15 DIAGNOSIS — R269 Unspecified abnormalities of gait and mobility: Secondary | ICD-10-CM

## 2020-12-15 DIAGNOSIS — R262 Difficulty in walking, not elsewhere classified: Secondary | ICD-10-CM

## 2020-12-15 DIAGNOSIS — M6281 Muscle weakness (generalized): Secondary | ICD-10-CM

## 2020-12-15 DIAGNOSIS — K592 Neurogenic bowel, not elsewhere classified: Secondary | ICD-10-CM

## 2020-12-15 DIAGNOSIS — F39 Unspecified mood [affective] disorder: Secondary | ICD-10-CM

## 2020-12-15 NOTE — Therapy (Signed)
[place] MAIN [facility] SERVICES [address] [place], [place], [address] Phone: [phone]   Fax:  [phone]  Occupational Therapy Treatment  Patient Details  Name: Sherry [name] MRN: [id number] Date of Birth: April 23, 1934 No data recorded  Encounter Date: 12/15/2020   OT End of Session - 12/15/20 1441     Visit Number 34    Number of Visits 96    Date for OT Re-Evaluation 01/12/21    Authorization Time Period Progress report period starting 11/26/2020    OT Start Time 1100    OT Stop Time 1145    OT Time Calculation (min) 45 min    Activity Tolerance Patient tolerated treatment well    Behavior During Therapy [facility] for tasks assessed/performed             Past Medical History:  Diagnosis Date   Acute blood loss anemia    Arthritis    Cancer ([place])    skin   Central cord syndrome at C6 level of cervical spinal cord ([place]) 11/29/2017   Hypertension    Protein-calorie malnutrition, severe ([place]) 01/24/2018   S/P insertion of IVC (inferior vena caval) filter 01/24/2018   Tetraparesis ([place])     Past Surgical History:  Procedure Laterality Date   ANTERIOR CERVICAL DECOMP/DISCECTOMY FUSION N/A 11/29/2017   Procedure: Cervical five-six, six-seven Anterior Cervical Decompression Fusion;  Surgeon: Judith Part, MD;  Location: [place];  Service: Neurosurgery;  Laterality: N/A;  Cervical five-six, six-seven Anterior Cervical Decompression Fusion   CATARACT EXTRACTION     IR IVC FILTER PLMT / S&I /IMG GUID/MOD SED  12/08/2017    There were no vitals filed for this visit.   Subjective Assessment - 12/15/20 1440     Subjective  Pt. reports that she is doing well today.    Patient is accompanied by: Family member    Pertinent History Patient s/p fall November 29, 2017 resulting in diagnosis of central cord syndrome at C 6 level.  She has had therapy in multiple venues but with recent move to [facility], therapy staff recommended she  seek outpatient therapy for her needs.    Currently in Pain? No/denies            OT TREATMENT     Pt. tolerated bilateral shoulder flexion, extension, abduction, elbow flexion, extension forearm supination AAROM/AROM. PROM bilateral wrist extension, digit MP, PIP, and DIP flexion, and extension, thumb radial, and palmar abduction in conjunction with moist heat. Pt. worked on [employer], and reciprocal motion using the UBE while sitting for 8 min. with minimal resistance. Constant monitoring was provided.   Pt. continues to make progress overall, and is improving with BUE reaching in preparation for improving overhead ADLs, and haircare.  Pt. was able to tolerate the UBE for 8 min. with the UBE elevated for increased ROM. Pt. tolerated ROM well without reports of pain, or discomfort. Pt. continues to present with bilateral wrist flexor tightness, and digit extensor tightness. Pt. continues to benefit from working on impoving ROM in order to work towards increasing engagement of her bilateral hands during ADLs, and IADL tasks.                             OT Education - 12/15/20 1440     Education Details BUE functioning    Person(s) Educated Patient;Caregiver(s)    Methods Explanation    Comprehension Verbalized understanding;Need further instruction  OT Long Term Goals - 10/20/20 1302       OT LONG TERM GOAL #1   Title Patient and caregiver will demonstrate understanding of home exercise program for ROM.    Baseline Pt. continues to have a restorative aide rehab aide assist her wih exercises at [facility]. 8/8: Independently completes/directs HEP mulitple times/week    Time 12    Period Weeks    Status Partially Met    Target Date 01/12/21      OT LONG TERM GOAL #4   Title Patient will demonstrate improved composite finger flexion to hold deodorant to apply to underarms.    Baseline 10/20/2020: Pt. conitnues to make steady progress  with bilateral digit flexion.Pt. conitnues to present with stiifness/tightness which is hindering pt.'s ability to formulate a full composite fist. Pt. continues to present with improving finger flexion of R hand for a partial gross grasp, but unable to grasp and hold items. pt. conitnues to present with limited digit flexion of L hand. Pt. has active left thumb abduction. 8/8: holds cup with 2 hands, continues to have limited composite finger flexion L worse than R    Time 12    Period Weeks    Status On-going    Target Date 01/12/21      OT LONG TERM GOAL #6   Title Patient will increase right UE ROM to comb the back of her hair with modified independence.    Baseline 10/20/2020: Pt. continues to progress wtith RRUE ROM, and is able to use a pic for the back of her head, however is unable to brush the back of her hair. 07/30/2020: Pt. is now able to comb the back of her hair with a pic, however continues to work on completing the task efficiently. 8/8: Achieves with great effort, has assistance for efficiancy    Time 12    Period Weeks    Status Partially Met    Target Date 01/12/21      OT LONG TERM GOAL #7   Title Pt. will independently, and efficiently send text messages on her phone.    Baseline 10/20/2020: Pt. is now able to type a text message on the phone, however not efficiently taking increased time to complete. 07/30/2020: Pt. continues to present with limited BUE strength, and [facility] skills. 8/8: independtly drafts text messages, continues to work on accuracy and efficiancy 2/2 limited [facility]    Time 12    Period Weeks    Status Partially Met    Target Date 01/12/21      OT LONG TERM GOAL #8   Title Pt. will write, and sign her name with 100% legibility, and modified independence.    Baseline 10/20/2020: Pt. continues to make progress overall. Pt. continues to consistently fill out her daily menu, and writing for crossword puzzles. Pt. is able to maintain grasp on a wide width pen.  [place] to work on [employer]. 8/8: writes daily, continues to work on improving speed    Time 12    Period Weeks    Status Partially Met    Target Date 01/12/21                   Plan - 12/15/20 1441     Clinical Impression Statement Pt. continues to make progress overall, and is improving with BUE reaching in preparation for improving overhead ADLs, and haircare.  Pt. was able to tolerate the UBE for 8 min. with the UBE elevated for  increased ROM. Pt. tolerated ROM well without reports of pain, or discomfort. Pt. continues to present with bilateral wrist flexor tightness, and digit extensor tightness. Pt. continues to benefit from working on impoving ROM in order to work towards increasing engagement of her bilateral hands during ADLs, and IADL tasks.    OT Occupational Profile and History Detailed Assessment- Review of Records and additional review of physical, cognitive, psychosocial history related to current functional performance    Occupational performance deficits (Please refer to evaluation for details): ADL's;IADL's;Leisure;Social Participation    Body Structure / Function / Physical Skills ADL;Continence;Dexterity;Flexibility;Strength;ROM;Balance;Coordination;FMC;IADL;Endurance;UE functional use;Decreased knowledge of use of DME;GMC    Psychosocial Skills Coping Strategies    Rehab Potential Fair    Clinical Decision Making Several treatment options, min-mod task modification necessary    Comorbidities Affecting Occupational Performance: Presence of comorbidities impacting occupational performance    Comorbidities impacting occupational performance description: contractures of bilateral hands, dependent transfers,    Modification or Assistance to Complete Evaluation  No modification of tasks or assist necessary to complete eval    OT Frequency 2x / week    OT Duration 12 weeks    OT Treatment/Interventions Self-care/ADL training;Cryotherapy;Therapeutic  exercise;DME and/or AE instruction;Balance training;Neuromuscular education;Manual Therapy;Splinting;Moist Heat;Passive range of motion;Therapeutic activities;Patient/family education    Consulted and Agree with Plan of Care Patient             Patient will benefit from skilled therapeutic intervention in order to improve the following deficits and impairments:   Body Structure / Function / Physical Skills: ADL, Continence, Dexterity, Flexibility, Strength, ROM, Balance, Coordination, FMC, IADL, Endurance, UE functional use, Decreased knowledge of use of DME, GMC   Psychosocial Skills: Coping Strategies   Visit Diagnosis: Muscle weakness (generalized)    Problem List Patient Active Problem List   Diagnosis Date Noted   Acute appendicitis with appendiceal abscess 05/30/2018   Hypocalcemia 02/15/2018   Dysuria 02/15/2018   Bilateral lower extremity edema 02/11/2018   Vaginal yeast infection 01/30/2018   Chest tightness 01/27/2018   Healthcare-associated pneumonia 01/25/2018   Pleural effusion, not elsewhere classified 01/25/2018   Acute deep vein thrombosis (DVT) of left lower extremity ([place]) 01/24/2018   Acute deep vein thrombosis (DVT) of right lower extremity ([place]) 01/24/2018   Chronic allergic rhinitis 01/24/2018   Depression with anxiety 01/24/2018   UTI due to Klebsiella species 01/24/2018   Protein-calorie malnutrition, severe ([place]) 01/24/2018   S/P insertion of IVC (inferior vena caval) filter 01/24/2018   Reactive depression    Hypokalemia    Leukocytosis    Essential hypertension    Trauma    Tetraparesis ([facility])    Neuropathic pain    Neurogenic bowel    Neurogenic bladder    Benign essential HTN    Acute blood loss anemia    Central cord syndrome at C6 level of cervical spinal cord ([place]) 11/29/2017   Central cord syndrome ([place]) 11/29/2017    Harrel Carina, MS, OTR/L 12/15/2020, 2:42 PM  [place] MAIN [facility]  SERVICES [address] [place], [place], [address] Phone: [phone]   Fax:  [phone]  Name: ZAHRIAH ROES MRN: [id number] Date of Birth: 1934-03-31

## 2020-12-15 NOTE — Therapy (Signed)
[place] MAIN [facility] SERVICES [address] [place], [place], [address] Phone: [phone]   Fax:  [phone]  Physical Therapy Treatment  Patient Details  Name: Sherry [name] MRN: [id number] Date of Birth: 1934/06/26 No data recorded  Encounter Date: 12/15/2020   PT End of Session - 12/15/20 1154     Visit Number 109    Number of Visits 113    Date for PT Re-Evaluation 12/17/20    Authorization Type [employer]are FOTO performed by PT on eval (7/20), score 12, Progress note on 03/10/2020; PN on 08/20/2020    Authorization Time Period Recert 03/07/2681- 05/20/6220; PN on 10/09/9890; Recert 02/20/4172- 09/14/4816; PN on 10/01/2020    PT Start Time 1145    PT Stop Time 1226    PT Time Calculation (min) 41 min    Equipment Utilized During Treatment Gait belt    Activity Tolerance Patient tolerated treatment well;Patient limited by fatigue;Patient limited by pain    Behavior During Therapy WFL for tasks assessed/performed             Past Medical History:  Diagnosis Date   Acute blood loss anemia    Arthritis    Cancer ([place])    skin   Central cord syndrome at C6 level of cervical spinal cord ([place]) 11/29/2017   Hypertension    Protein-calorie malnutrition, severe ([place]) 01/24/2018   S/P insertion of IVC (inferior vena caval) filter 01/24/2018   Tetraparesis ([place])     Past Surgical History:  Procedure Laterality Date   ANTERIOR CERVICAL DECOMP/DISCECTOMY FUSION N/A 11/29/2017   Procedure: Cervical five-six, six-seven Anterior Cervical Decompression Fusion;  Surgeon: Judith Part, MD;  Location: [facility];  Service: Neurosurgery;  Laterality: N/A;  Cervical five-six, six-seven Anterior Cervical Decompression Fusion   CATARACT EXTRACTION     IR IVC FILTER PLMT / S&I /IMG GUID/MOD SED  12/08/2017    There were no vitals filed for this visit.   Subjective Assessment - 12/15/20 1153     Subjective Patient reports having a good weekned  and states no pain. She provided PT with written copy of updated x-ray.    Patient is accompained by: --   [name] (aide)   Pertinent History Pt is an 86 yo female that fell in 2019, fractured vertebrae in her neck and in her low back per family. 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 After hospital stay, pt discharged to [facility] for ~1 month, experienced severe UTI as well as bilateral DVT (IVC filter placed). Discharged to [facility], stayed for about a year, and then moved to [facility] in April 2021. Was receiving PT, but per family facility reported that did not have adequate equipment to maximize PT for her. Pt until about 1 month ago was practicing sit to stand transfers with 1-2 people, and working on static standing. Has a brace for L foot due to PF. Pt currently needs assistance with all ADLs (able to assist with donning/doffing her shirt), bed baths, and needs a hoyer lift for transfers. Able to drive power wheelchair without assistance.    Limitations Standing;Walking;House hold activities;Lifting    How long can you sit comfortably? NA    How long can you stand comfortably? unable    How long can you walk comfortably? unable    Patient Stated Goals "to stand up and walk"    Currently in Pain? No/denies                     *  Patient remains Left LE NWB due to recent ankle fracture- she did bring in recent x-ray with results stating healing Left ankle. Patient reports MD said wait until 2 more weeks to try to stand.    Intervention:    Seated Hip march- AROM x 15 reps each LE. Then 2nd set with AAROM x 15 reps  Seated Knee ext w/ 3 second hold - AROM on left and AAROM on right LE. 2 sets of 15 reps. VC for  eccentric control on last 3 reps. Abdominal sit up - (chair reclined for optimal ROM) 2 sets of 15 reps    Hip adduction sets -squeeze red ball- Hold 5 sec x 15 reps x 2 sets  Heel slides BLE  2 sets x 15 reps (AAROM for improved range)    Hip  abduction with min physical Assistance B LE  2 sets of 15 reps   Quad sets LLE with 5 sec hold x 10 reps Patient then fatigued and stated very tired- session terminated.    Education provided throughout session via VC/TC and demonstration to facilitate movement at target joints and correct muscle activation for all  exercises performed.    Clinical Impression: Patient performed well today with increased reps/sets versus last visit. She did endorse fatigue as limiting factor today. She denied any pain but did exhibit overall fatigue at end of session today.  Pt will continue to progress strength toward weight bearing when cleared by her doctor. Pt will benefit from skilled physical therapy to increase mobility and strength to improve patient's quality of life.                           PT Education - 12/15/20 1300     Education Details Exercise technique    Person(s) Educated Patient    Methods Explanation;Demonstration;Tactile cues;Verbal cues    Comprehension Verbalized understanding;Returned demonstration;Verbal cues required;Tactile cues required;Need further instruction              PT Short Term Goals - 03/10/20 2211       PT SHORT TERM GOAL #1   Title The patient will perform initial HEP with minimum assistance in order to improve strength and function.    Baseline Patient demonstrating independence with initial HEP as of 03/10/2020 with no questions or difficulty.    Time 4    Period Weeks    Status Achieved    Target Date 02/11/20               PT Long Term Goals - 11/12/20 1148       PT LONG TERM GOAL #1   Title The patient will be compliant with finalized HEP with minimum assistance in preparation for self management and maintenance of condition.    Baseline 03/10/2020- Patient familiar with initial HEP and will keep goal active as HEP becomes more progressive including possible transfers and standing activities. 04/07/2020- Patient continues  to report compliance with LE strengthening home exercises without questions or concerns. 04/21/2020- Patient able to verbalize and demonstrate good understanding of current HEP including some supine and seated LE strengthening exercises. Reviewed and patient performed 10 reps today with minimal cueing. Will keep goal active to progress HEP as appropriate. 05/28/2020- patient reports continues to perform LE strengthening as able and some help from caregiver as able. No changes at this time to home program as patient is limited to supine/seated therex. 07/02/2020- Patient continues to report compliance with  current supine and seated LE strengtheing home program and states no questions or concerns regarding her plan.    Time 12    Period Weeks    Status Achieved      PT LONG TERM GOAL #2   Title The patient will demonstrate at least 10 point improvement on FOTO score indicating an improved ability to perform functional activities.    Baseline on eval (/720) score 12; 10/22/19: 12; 12/10/19: 18, 12/13: 18: 04/07/2020- Will assess next visit. 07/02/2020- Will obtain next visit. 09/24/2020= Will obtain next visit. 11/12/2020= 12    Time 12    Period Weeks    Status On-going    Target Date 12/17/20      PT LONG TERM GOAL #3   Title The patient will demonstrate lateral scooting  for assistance with transfers with minA to maximize independence.    Baseline 10/22/19: Pt requires modA+1 for lateral scooting; 12/10/19: modA+1, 12/13: modA-maxA, 12/27: modA-maxA, 03/10/2020- ModA-MaxA- increased verbal cues and visual demonstration. 04/07/2020- Continued Max assist to perform forward/lateral scoot. 04/21/2020- Patient was able to demo slight lateral scoot with CGA approx 12 in then fatigued requiring max assist- she is able to scoot forward by leaning without significant issues. 05/28/2020- While sitting in chair- patient able to perform forward and lateral scoot with VC/visual demo and increased time allowed- CGA today but not  consistent yet- will keep goal active at this time to continue to focus on strengthening and improving technique. 07/02/2020- Patient able to demonstrate minimal ability to laterally shift weigh on mat table requiring max assist yet is able to forward scoot out to edge of mat table with min Assist. 08/20/2020 - Patient was able to demonstrate minimal lateral scooting at edge of mat while holding feet still so they did not slip on tile surface. She continues to be able to demonstrate scoot forward and uses tilt option to recline to position safely back into seat of power wheelchair. 09/25/2020- Patient able to sit on mat and minimally scoot laterally but requires min/mod A for efficient scooting. Will end goal at this time due to plateau in progress with scooting over extended time.    Time 12    Period Weeks    Status Not Met      PT LONG TERM GOAL #4   Title The patient will demonstrate a squat pivot transfer with minimum assistance to maximize independence and mobility.    Baseline 10/22/19: unable to safely attempt at this time; 12/10/19: unable to safely attempt at this time, 01/14/20: unable to safety attempt at this time. 03/10/2020- Patient able to perform Stand pivot transfer with Maximal assist. 04/07/2020- Patient continues to require Max assist with sit to stand- will assist SPT next visit. 04/21/2020- Patient presents with Max assist to perform Sit to stand Transfer- unable to pivot or move Left LE well without difficulty. 07/02/2020= Max assist with SPT and minimal ability to pivot. 7/202/2022-Patient able to stand with minimal assist this visit (as good as CGA last visit) and able to move right foot to pivot and performing with moderate assist today. 09/24/2020- Patient able to perform sit to stand with Min assist from lift chair. Patient has demo inconsistency and will keep goal active.    Time 12    Period Weeks    Status On-going      PT LONG TERM GOAL #5   Title The patient will demonstrate  sitting without UE support for 2-5 minutes at EOB to improve participation and maximize  independence with ADLs.    Baseline 10/22/19: Pt able to sit unsupported for at least 2 minutes at edge of bed. 04/21/2020- Patient continues to demo good sitting at edge of mat > 5 miin without difficulty or back support.    Time 12    Period Weeks    Status Achieved      PT LONG TERM GOAL #6   Title Patient will demonstrate improved functional LE strength as seen by consistent ability to stand > 2 min (3 out of 4 trials) with Mod/Max A for improved LE strength with transfers.    Baseline 05/28/2020- Patient able to inconsistent stand 1-2 min right now in //bars with Mod/Max A. 07/02/2020- Patient was able to progress last 2 visit to standing using bilateral platform attachment between 1-2 min which is a progression from the parallel bars. 08/20/2020- Patient performed static standing with min- mod A using bilateral platform attachment on walker for 6 min 30 sec today with assist for trunk control and intermittent verbal cues for posture. 09/24/2020- Patient has consistently been able to stand >2 min in // bars and when using bilateral Platform walker.    Time 12    Period Weeks    Status Achieved      PT LONG TERM GOAL #7   Title Patient/caregiver will demonstrate assisted transfer using [place] consistently Independently for improved ability to transfer at home from lift chair to bed.    Baseline 09/24/2020- Patient requires hoyer lift (Dependent) for safe transfers.    Time 12    Period Weeks    Status New                   Plan - 12/15/20 1205     Clinical Impression Statement Patient performed well today with increased reps/sets versus last visit. She did endorse fatigue as limiting factor today. She denied any pain but did exhibit overall fatigue at end of session today.  Pt will continue to progress strength toward weight bearing when cleared by her doctor. Pt will benefit from skilled  physical therapy to increase mobility and strength to improve patient's quality of life    Personal Factors and Comorbidities Age;Time since onset of injury/illness/exacerbation;Comorbidity 3+;Fitness    Comorbidities HTN, quadriparesis, history of DVT, neurogenic bladder    Examination-Activity Limitations Bathing;Hygiene/Grooming;Squat;Bed Mobility;Lift;Bend;Stand;Engineer, manufacturing;Toileting;Self Feeding;Transfers;Continence;Sit;Dressing;Sleep;Carry    Examination-Participation Restrictions Church;Laundry;Cleaning;Medication Management;Community Activity;Meal Prep;Interpersonal Relationship    Stability/Clinical Decision Making Evolving/Moderate complexity    Rehab Potential Fair    PT Frequency 2x / week    PT Duration 12 weeks    PT Treatment/Interventions ADLs/Self Care Home Management;Electrical Stimulation;Therapeutic activities;Wheelchair mobility training;Vasopneumatic Device;Joint Manipulations;Vestibular;Passive range of motion;Patient/family education;Therapeutic exercise;DME Instruction;Biofeedback;Aquatic Therapy;Moist Heat;Gait training;Balance training;Orthotic Fit/Training;Dry needling;Energy conservation;Taping;Splinting;Neuromuscular re-education;Cryotherapy;Ultrasound;Functional mobility training    PT Next Visit Plan Continue with progressive standing/transfer training.    PT Home Exercise Plan no changes    Consulted and Agree with Plan of Care Patient             Patient will benefit from skilled therapeutic intervention in order to improve the following deficits and impairments:  Abnormal gait, Decreased balance, Decreased endurance, Decreased mobility, Difficulty walking, Hypomobility, Impaired sensation, Decreased range of motion, Improper body mechanics, Impaired perceived functional ability, Decreased activity tolerance, Decreased knowledge of use of DME, Decreased safety awareness, Decreased strength, Impaired flexibility, Impaired UE functional use,  Postural dysfunction  Visit Diagnosis: Abnormality of gait and mobility  Difficulty in walking, not elsewhere classified  Muscle weakness (generalized)  Unsteadiness on feet     Problem List Patient Active Problem List   Diagnosis Date Noted   Acute appendicitis with appendiceal abscess 05/30/2018   Hypocalcemia 02/15/2018   Dysuria 02/15/2018   Bilateral lower extremity edema 02/11/2018   Vaginal yeast infection 01/30/2018   Chest tightness 01/27/2018   Healthcare-associated pneumonia 01/25/2018   Pleural effusion, not elsewhere classified 01/25/2018   Acute deep vein thrombosis (DVT) of left lower extremity ([place]) 01/24/2018   Acute deep vein thrombosis (DVT) of right lower extremity ([place]) 01/24/2018   Chronic allergic rhinitis 01/24/2018   Depression with anxiety 01/24/2018   UTI due to Klebsiella species 01/24/2018   Protein-calorie malnutrition, severe ([place]) 01/24/2018   S/P insertion of IVC (inferior vena caval) filter 01/24/2018   Reactive depression    Hypokalemia    Leukocytosis    Essential hypertension    Trauma    Tetraparesis ([facility])    Neuropathic pain    Neurogenic bowel    Neurogenic bladder    Benign essential HTN    Acute blood loss anemia    Central cord syndrome at C6 level of cervical spinal cord ([place]) 11/29/2017   Central cord syndrome ([place]) 11/29/2017    Lewis Moccasin, PT 12/15/2020, 2:46 PM  [place] MAIN [facility] SERVICES [place], [place], [address] Phone: [phone]   Fax:  [phone]  Name: Sherry [name] MRN: [id number] Date of Birth: Jun 01, 1934

## 2020-12-17 ENCOUNTER — Encounter: Payer: Self-pay | Admitting: Occupational Therapy

## 2020-12-17 ENCOUNTER — Other Ambulatory Visit: Payer: Self-pay

## 2020-12-17 ENCOUNTER — Ambulatory Visit: Payer: Medicare HMO

## 2020-12-17 ENCOUNTER — Ambulatory Visit: Payer: Medicare HMO | Admitting: Occupational Therapy

## 2020-12-17 DIAGNOSIS — R269 Unspecified abnormalities of gait and mobility: Secondary | ICD-10-CM

## 2020-12-17 DIAGNOSIS — R2681 Unsteadiness on feet: Secondary | ICD-10-CM

## 2020-12-17 DIAGNOSIS — R262 Difficulty in walking, not elsewhere classified: Secondary | ICD-10-CM

## 2020-12-17 DIAGNOSIS — M6281 Muscle weakness (generalized): Secondary | ICD-10-CM

## 2020-12-17 NOTE — Therapy (Signed)
[place] MAIN [facility] SERVICES [address] [place], [place], [address] Phone: [phone]   Fax:  [phone]  Occupational Therapy Treatment  Patient Details  Name: Sherry [name] MRN: [id number] Date of Birth: March 23, 1934 No data recorded  Encounter Date: 12/17/2020   OT End of Session - 12/17/20 1213     Visit Number 86    Number of Visits 96    Date for OT Re-Evaluation 01/12/21    Authorization Time Period Progress report period starting 11/26/2020    OT Start Time 1103    OT Stop Time 1145    OT Time Calculation (min) 42 min    Equipment Utilized During Treatment moist heat    Activity Tolerance Patient tolerated treatment well    Behavior During Therapy WFL for tasks assessed/performed             Past Medical History:  Diagnosis Date   Acute blood loss anemia    Arthritis    Cancer ([place])    skin   Central cord syndrome at C6 level of cervical spinal cord ([place]) 11/29/2017   Hypertension    Protein-calorie malnutrition, severe ([place]) 01/24/2018   S/P insertion of IVC (inferior vena caval) filter 01/24/2018   Tetraparesis ([place])     Past Surgical History:  Procedure Laterality Date   ANTERIOR CERVICAL DECOMP/DISCECTOMY FUSION N/A 11/29/2017   Procedure: Cervical five-six, six-seven Anterior Cervical Decompression Fusion;  Surgeon: Judith Part, MD;  Location: [facility];  Service: Neurosurgery;  Laterality: N/A;  Cervical five-six, six-seven Anterior Cervical Decompression Fusion   CATARACT EXTRACTION     IR IVC FILTER PLMT / S&I /IMG GUID/MOD SED  12/08/2017    There were no vitals filed for this visit.   Subjective Assessment - 12/17/20 1212     Subjective  Pt. reports that she is doing well today.    Patient is accompanied by: Family member    Pertinent History Patient s/p fall November 29, 2017 resulting in diagnosis of central cord syndrome at C 6 level.  She has had therapy in multiple venues but with recent  move to [facility], therapy staff recommended she seek outpatient therapy for her needs.    Patient Stated Goals Patient would like to be able to move better in bed, stand and perform self care tasks.    Currently in Pain? No/denies            OT TREATMENT     Pt. tolerated bilateral shoulder flexion, extension, abduction, elbow flexion, extension forearm supination AAROM/AROM. PROM bilateral wrist extension, digit MP, PIP, and DIP flexion, and extension, thumb radial, and palmar abduction in conjunction with moist heat. Pt. worked on [employer], and reciprocal motion using the UBE while sitting for 8 min. with minimal resistance. Constant monitoring was provided. Pt. Worked on bilateral UE reaching up, and out for narrow cones with trunk rotation while crossing midline to place them on a lower surface.   Pt. continues to make progress overall, and is improving with BUE reaching in preparation for improving overhead ADLs, and haircare.  Pt. was able to tolerate the UBE for 8 min. with the UBE elevated for increased ROM. Pt. Was able to perform reps of reaching with trunk rotation with the bilateral UE's. Pt. tolerated ROM well without reports of pain, or discomfort. Pt. continues to present with bilateral wrist flexor tightness, and digit extensor tightness. Pt. continues to benefit from working on impoving ROM in order to work  towards increasing engagement of her bilateral hands during ADLs, and IADL tasks.                           OT Education - 12/17/20 1212     Education Details BUE functioning    Person(s) Educated Patient;Caregiver(s)    Methods Explanation    Comprehension Verbalized understanding;Need further instruction                 OT Long Term Goals - 10/20/20 1302       OT LONG TERM GOAL #1   Title Patient and caregiver will demonstrate understanding of home exercise program for ROM.    Baseline Pt. continues to have a restorative aide  rehab aide assist her wih exercises at [facility]. 8/8: Independently completes/directs HEP mulitple times/week    Time 12    Period Weeks    Status Partially Met    Target Date 01/12/21      OT LONG TERM GOAL #4   Title Patient will demonstrate improved composite finger flexion to hold deodorant to apply to underarms.    Baseline 10/20/2020: Pt. conitnues to make steady progress with bilateral digit flexion.Pt. conitnues to present with stiifness/tightness which is hindering pt.'s ability to formulate a full composite fist. Pt. continues to present with improving finger flexion of R hand for a partial gross grasp, but unable to grasp and hold items. pt. conitnues to present with limited digit flexion of L hand. Pt. has active left thumb abduction. 8/8: holds cup with 2 hands, continues to have limited composite finger flexion L worse than R    Time 12    Period Weeks    Status On-going    Target Date 01/12/21      OT LONG TERM GOAL #6   Title Patient will increase right UE ROM to comb the back of her hair with modified independence.    Baseline 10/20/2020: Pt. continues to progress wtith RRUE ROM, and is able to use a pic for the back of her head, however is unable to brush the back of her hair. 07/30/2020: Pt. is now able to comb the back of her hair with a pic, however continues to work on completing the task efficiently. 8/8: Achieves with great effort, has assistance for efficiancy    Time 12    Period Weeks    Status Partially Met    Target Date 01/12/21      OT LONG TERM GOAL #7   Title Pt. will independently, and efficiently send text messages on her phone.    Baseline 10/20/2020: Pt. is now able to type a text message on the phone, however not efficiently taking increased time to complete. 07/30/2020: Pt. continues to present with limited BUE strength, and [facility] skills. 8/8: independtly drafts text messages, continues to work on accuracy and efficiancy 2/2 limited [facility]    Time 12    Period  Weeks    Status Partially Met    Target Date 01/12/21      OT LONG TERM GOAL #8   Title Pt. will write, and sign her name with 100% legibility, and modified independence.    Baseline 10/20/2020: Pt. continues to make progress overall. Pt. continues to consistently fill out her daily menu, and writing for crossword puzzles. Pt. is able to maintain grasp on a wide width pen. [place] to work on [employer]. 8/8: writes daily, continues to work on improving speed  Time 12    Period Weeks    Status Partially Met    Target Date 01/12/21                   Plan - 12/17/20 1213     Clinical Impression Statement Pt. continues to make progress overall, and is improving with BUE reaching in preparation for improving overhead ADLs, and haircare.  Pt. was able to tolerate the UBE for 8 min. with the UBE elevated for increased ROM. Pt. Was able to perform reps of reaching with trunk rotation with the bilateral UE's. Pt. tolerated ROM well without reports of pain, or discomfort. Pt. continues to present with bilateral wrist flexor tightness, and digit extensor tightness. Pt. continues to benefit from working on impoving ROM in order to work towards increasing engagement of her bilateral hands during ADLs, and IADL tasks.            OT Occupational Profile and History Detailed Assessment- Review of Records and additional review of physical, cognitive, psychosocial history related to current functional performance    Occupational performance deficits (Please refer to evaluation for details): ADL's;IADL's;Leisure;Social Participation    Body Structure / Function / Physical Skills ADL;Continence;Dexterity;Flexibility;Strength;ROM;Balance;Coordination;FMC;IADL;Endurance;UE functional use;Decreased knowledge of use of DME;GMC    Psychosocial Skills Coping Strategies    Rehab Potential Fair    Clinical Decision Making Several treatment options, min-mod task modification necessary     Comorbidities Affecting Occupational Performance: Presence of comorbidities impacting occupational performance    Comorbidities impacting occupational performance description: contractures of bilateral hands, dependent transfers,    Modification or Assistance to Complete Evaluation  No modification of tasks or assist necessary to complete eval    OT Frequency 2x / week    OT Duration 12 weeks    OT Treatment/Interventions Self-care/ADL training;Cryotherapy;Therapeutic exercise;DME and/or AE instruction;Balance training;Neuromuscular education;Manual Therapy;Splinting;Moist Heat;Passive range of motion;Therapeutic activities;Patient/family education    Consulted and Agree with Plan of Care Patient             Patient will benefit from skilled therapeutic intervention in order to improve the following deficits and impairments:   Body Structure / Function / Physical Skills: ADL, Continence, Dexterity, Flexibility, Strength, ROM, Balance, Coordination, FMC, IADL, Endurance, UE functional use, Decreased knowledge of use of DME, GMC   Psychosocial Skills: Coping Strategies   Visit Diagnosis: Muscle weakness (generalized)    Problem List Patient Active Problem List   Diagnosis Date Noted   Acute appendicitis with appendiceal abscess 05/30/2018   Hypocalcemia 02/15/2018   Dysuria 02/15/2018   Bilateral lower extremity edema 02/11/2018   Vaginal yeast infection 01/30/2018   Chest tightness 01/27/2018   Healthcare-associated pneumonia 01/25/2018   Pleural effusion, not elsewhere classified 01/25/2018   Acute deep vein thrombosis (DVT) of left lower extremity ([place]) 01/24/2018   Acute deep vein thrombosis (DVT) of right lower extremity ([place]) 01/24/2018   Chronic allergic rhinitis 01/24/2018   Depression with anxiety 01/24/2018   UTI due to Klebsiella species 01/24/2018   Protein-calorie malnutrition, severe ([place]) 01/24/2018   S/P insertion of IVC (inferior vena caval) filter 01/24/2018    Reactive depression    Hypokalemia    Leukocytosis    Essential hypertension    Trauma    Tetraparesis ([facility])    Neuropathic pain    Neurogenic bowel    Neurogenic bladder    Benign essential HTN    Acute blood loss anemia    Central cord syndrome at C6 level of cervical spinal cord ([place])  11/29/2017   Central cord syndrome [facility]) 11/29/2017    Harrel Carina, MS, OTR/L 12/17/2020, 12:14 PM  [place] MAIN [facility] SERVICES [address] [place], [place], [address] Phone: [phone]   Fax:  [phone]  Name: Sherry [name] MRN: [id number] Date of Birth: 11-01-1934

## 2020-12-18 NOTE — Therapy (Signed)
[place] MAIN [facility] SERVICES [address] [place], [place], [address] Phone: [phone]   Fax:  [phone]  Physical Therapy Treatment/Physical Therapy Progress Note   Dates of reporting period 11/10/2020 to   12/17/2020  Patient Details  Name: Sherry [name] MRN: [id number] Date of Birth: 1934-02-03 No data recorded  Encounter Date: 12/17/2020   PT End of Session - 12/17/20 1038     Visit Number 110    Number of Visits 113    Date for PT Re-Evaluation 12/17/20    Authorization Type [employer] medicare FOTO performed by PT on eval (7/20), score 12, Progress note on 03/10/2020; PN on 08/20/2020    Authorization Time Period Recert 03/10/7410- 8/78/6767; PN on 2/0/9470; Recert 9/62/8366- 29/47/6546; PN on 10/01/2020    PT Start Time 1146    PT Stop Time 1227    PT Time Calculation (min) 41 min    Equipment Utilized During Treatment Gait belt    Activity Tolerance Patient tolerated treatment well;Patient limited by fatigue;Patient limited by pain    Behavior During Therapy WFL for tasks assessed/performed             Past Medical History:  Diagnosis Date   Acute blood loss anemia    Arthritis    Cancer ([place])    skin   Central cord syndrome at C6 level of cervical spinal cord ([place]) 11/29/2017   Hypertension    Protein-calorie malnutrition, severe ([place]) 01/24/2018   S/P insertion of IVC (inferior vena caval) filter 01/24/2018   Tetraparesis ([place])     Past Surgical History:  Procedure Laterality Date   ANTERIOR CERVICAL DECOMP/DISCECTOMY FUSION N/A 11/29/2017   Procedure: Cervical five-six, six-seven Anterior Cervical Decompression Fusion;  Surgeon: Judith Part, MD;  Location: [place];  Service: Neurosurgery;  Laterality: N/A;  Cervical five-six, six-seven Anterior Cervical Decompression Fusion   CATARACT EXTRACTION     IR IVC FILTER PLMT / S&I /IMG GUID/MOD SED  12/08/2017    There were no vitals filed for this visit.    Subjective Assessment - 12/17/20 1149     Subjective Patient reports no new issues since last visit. Continues to deny pain    Patient is accompained by: --   [name] (aide)   Pertinent History Pt is an 86 yo female that fell in 2019, fractured vertebrae in her neck and in her low back per family. 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 After hospital stay, pt discharged to [facility] for ~1 month, experienced severe UTI as well as bilateral DVT (IVC filter placed). Discharged to [facility], stayed for about a year, and then moved to [facility] in April 2021. Was receiving PT, but per family facility reported that did not have adequate equipment to maximize PT for her. Pt until about 1 month ago was practicing sit to stand transfers with 1-2 people, and working on static standing. Has a brace for L foot due to PF. Pt currently needs assistance with all ADLs (able to assist with donning/doffing her shirt), bed baths, and needs a hoyer lift for transfers. Able to drive power wheelchair without assistance.    Limitations Standing;Walking;House hold activities;Lifting    How long can you sit comfortably? NA    How long can you stand comfortably? unable    How long can you walk comfortably? unable    Patient Stated Goals "to stand up and walk"  Interventions:   *Unable to assess transfer/standing/sit to stand lift transfer goals for progresss note due to patient continues to be Left LE NWB.  Therapeutic exercises: Reclined exercises:  B quad sets -hold 5 sec x 15 reps - VC to maintain contraction B gluteal sets - hold 5 sec x 15 reps B Hip adduction sets -squeeze red ball- Hold 5 sec x 15 reps x 2 sets B Heel slides BLE  2 sets x 15 reps (AAROM for optimal range)   Hip abduction with min physical Assistance R LE and use of 2# ankle weights-   2 sets of 15 reps Seated Hip march- AROM x 15 reps each LE. Then 2nd set with AAROM x 15 reps  Seated Knee ext w/ 3 second  hold - AROM on left and AAROM on right LE. 2 sets of 15 reps.   Abdominal sit up - (chair reclined for optimal ROM) 2 sets of 15 reps    Education provided throughout session via VC/TC and demonstration to facilitate movement at target joints and correct muscle activation for all testing and exercises performed.   Clinical Impression: Patient continues to be limited in activity secondary to left ankle fracture and NWB. Treatment continues to focus on LE strengthening while she is recovering and she was able to add some resistance and continues to work hard to complete sets. Patient's condition has the potential to improve in response to therapy. Maximum improvement is yet to be obtained. The anticipated improvement is attainable and reasonable in a generally predictable time.                             PT Education - 12/18/20 1036     Education Details Exercise technique and rationale for more resistive exercises as appropriate.    Person(s) Educated Patient    Methods Explanation;Demonstration;Tactile cues;Verbal cues    Comprehension Verbalized understanding;Returned demonstration;Verbal cues required;Need further instruction;Tactile cues required              PT Short Term Goals - 03/10/20 2211       PT SHORT TERM GOAL #1   Title The patient will perform initial HEP with minimum assistance in order to improve strength and function.    Baseline Patient demonstrating independence with initial HEP as of 03/10/2020 with no questions or difficulty.    Time 4    Period Weeks    Status Achieved    Target Date 02/11/20               PT Long Term Goals - 12/17/20 1043       PT LONG TERM GOAL #1   Title The patient will be compliant with finalized HEP with minimum assistance in preparation for self management and maintenance of condition.    Baseline 03/10/2020- Patient familiar with initial HEP and will keep goal active as HEP becomes more progressive  including possible transfers and standing activities. 04/07/2020- Patient continues to report compliance with LE strengthening home exercises without questions or concerns. 04/21/2020- Patient able to verbalize and demonstrate good understanding of current HEP including some supine and seated LE strengthening exercises. Reviewed and patient performed 10 reps today with minimal cueing. Will keep goal active to progress HEP as appropriate. 05/28/2020- patient reports continues to perform LE strengthening as able and some help from caregiver as able. No changes at this time to home program as patient is limited to supine/seated therex. 07/02/2020- Patient continues  to report compliance with current supine and seated LE strengtheing home program and states no questions or concerns regarding her plan.    Time 12    Period Weeks    Status Achieved      PT LONG TERM GOAL #2   Title The patient will demonstrate at least 10 point improvement on FOTO score indicating an improved ability to perform functional activities.    Baseline on eval (/720) score 12; 10/22/19: 12; 12/10/19: 18, 12/13: 18: 04/07/2020- Will assess next visit. 07/02/2020- Will obtain next visit. 09/24/2020= Will obtain next visit. 11/12/2020= 12    Time 12    Period Weeks    Status On-going      PT LONG TERM GOAL #3   Title The patient will demonstrate lateral scooting  for assistance with transfers with minA to maximize independence.    Baseline 10/22/19: Pt requires modA+1 for lateral scooting; 12/10/19: modA+1, 12/13: modA-maxA, 12/27: modA-maxA, 03/10/2020- ModA-MaxA- increased verbal cues and visual demonstration. 04/07/2020- Continued Max assist to perform forward/lateral scoot. 04/21/2020- Patient was able to demo slight lateral scoot with CGA approx 12 in then fatigued requiring max assist- she is able to scoot forward by leaning without significant issues. 05/28/2020- While sitting in chair- patient able to perform forward and lateral scoot with  VC/visual demo and increased time allowed- CGA today but not consistent yet- will keep goal active at this time to continue to focus on strengthening and improving technique. 07/02/2020- Patient able to demonstrate minimal ability to laterally shift weigh on mat table requiring max assist yet is able to forward scoot out to edge of mat table with min Assist. 08/20/2020 - Patient was able to demonstrate minimal lateral scooting at edge of mat while holding feet still so they did not slip on tile surface. She continues to be able to demonstrate scoot forward and uses tilt option to recline to position safely back into seat of power wheelchair. 09/25/2020- Patient able to sit on mat and minimally scoot laterally but requires min/mod A for efficient scooting. Will end goal at this time due to plateau in progress with scooting over extended time.    Time 12    Period Weeks    Status Not Met      PT LONG TERM GOAL #4   Title The patient will demonstrate a squat pivot transfer with minimum assistance to maximize independence and mobility.    Baseline 10/22/19: unable to safely attempt at this time; 12/10/19: unable to safely attempt at this time, 01/14/20: unable to safety attempt at this time. 03/10/2020- Patient able to perform Stand pivot transfer with Maximal assist. 04/07/2020- Patient continues to require Max assist with sit to stand- will assist SPT next visit. 04/21/2020- Patient presents with Max assist to perform Sit to stand Transfer- unable to pivot or move Left LE well without difficulty. 07/02/2020= Max assist with SPT and minimal ability to pivot. 7/202/2022-Patient able to stand with minimal assist this visit (as good as CGA last visit) and able to move right foot to pivot and performing with moderate assist today. 09/24/2020- Patient able to perform sit to stand with Min assist from lift chair. Patient has demo inconsistency and will keep goal active.  12/17/2020- Goal is currently not appropriate as patient  fractured her ankle and currently NWB.    Time 12    Period Weeks    Status On-going    Target Date 12/17/20      PT LONG TERM GOAL #5  Title The patient will demonstrate sitting without UE support for 2-5 minutes at EOB to improve participation and maximize independence with ADLs.    Baseline 10/22/19: Pt able to sit unsupported for at least 2 minutes at edge of bed. 04/21/2020- Patient continues to demo good sitting at edge of mat > 5 miin without difficulty or back support.    Time 12    Period Weeks    Status Achieved      PT LONG TERM GOAL #6   Title Patient will demonstrate improved functional LE strength as seen by consistent ability to stand > 2 min (3 out of 4 trials) with Mod/Max A for improved LE strength with transfers.    Baseline 05/28/2020- Patient able to inconsistent stand 1-2 min right now in //bars with Mod/Max A. 07/02/2020- Patient was able to progress last 2 visit to standing using bilateral platform attachment between 1-2 min which is a progression from the parallel bars. 08/20/2020- Patient performed static standing with min- mod A using bilateral platform attachment on walker for 6 min 30 sec today with assist for trunk control and intermittent verbal cues for posture. 09/24/2020- Patient has consistently been able to stand >2 min in // bars and when using bilateral Platform walker.    Time 12    Period Weeks    Status Achieved      PT LONG TERM GOAL #7   Title Patient/caregiver will demonstrate assisted transfer using [place] consistently Independently for improved ability to transfer at home from lift chair to bed.    Baseline 09/24/2020- Patient requires hoyer lift (Dependent) for safe transfers.  12/17/2020- Patient currently still dependent on hoyer as she is currently non-weight bearing due to recent ankle fracture.    Time 12    Period Weeks    Status New    Target Date 12/17/20                   Plan - 12/17/20 1039     Clinical Impression  Statement Patient continues to be limited in activity secondary to left ankle fracture and NWB. Treatment continues to focus on LE strengthening while she is recovering and she was able to add some resistance and continues to work hard to complete sets. Patient's condition has the potential to improve in response to therapy. Maximum improvement is yet to be obtained. The anticipated improvement is attainable and reasonable in a generally predictable time.    Personal Factors and Comorbidities Age;Time since onset of injury/illness/exacerbation;Comorbidity 3+;Fitness    Comorbidities HTN, quadriparesis, history of DVT, neurogenic bladder    Examination-Activity Limitations Bathing;Hygiene/Grooming;Squat;Bed Mobility;Lift;Bend;Stand;Engineer, manufacturing;Toileting;Self Feeding;Transfers;Continence;Sit;Dressing;Sleep;Carry    Examination-Participation Restrictions Church;Laundry;Cleaning;Medication Management;Community Activity;Meal Prep;Interpersonal Relationship    Stability/Clinical Decision Making Evolving/Moderate complexity    Rehab Potential Fair    PT Frequency 2x / week    PT Duration 12 weeks    PT Treatment/Interventions ADLs/Self Care Home Management;Electrical Stimulation;Therapeutic activities;Wheelchair mobility training;Vasopneumatic Device;Joint Manipulations;Vestibular;Passive range of motion;Patient/family education;Therapeutic exercise;DME Instruction;Biofeedback;Aquatic Therapy;Moist Heat;Gait training;Balance training;Orthotic Fit/Training;Dry needling;Energy conservation;Taping;Splinting;Neuromuscular re-education;Cryotherapy;Ultrasound;Functional mobility training    PT Next Visit Plan Continue with progressive standing/transfer training.    PT Home Exercise Plan no changes    Consulted and Agree with Plan of Care Patient             Patient will benefit from skilled therapeutic intervention in order to improve the following deficits and impairments:  Abnormal gait,  Decreased balance, Decreased endurance, Decreased mobility, Difficulty walking, Hypomobility, Impaired sensation, Decreased range  of motion, Improper body mechanics, Impaired perceived functional ability, Decreased activity tolerance, Decreased knowledge of use of DME, Decreased safety awareness, Decreased strength, Impaired flexibility, Impaired UE functional use, Postural dysfunction  Visit Diagnosis: Abnormality of gait and mobility  Difficulty in walking, not elsewhere classified  Muscle weakness (generalized)  Unsteadiness on feet     Problem List Patient Active Problem List   Diagnosis Date Noted   Acute appendicitis with appendiceal abscess 05/30/2018   Hypocalcemia 02/15/2018   Dysuria 02/15/2018   Bilateral lower extremity edema 02/11/2018   Vaginal yeast infection 01/30/2018   Chest tightness 01/27/2018   Healthcare-associated pneumonia 01/25/2018   Pleural effusion, not elsewhere classified 01/25/2018   Acute deep vein thrombosis (DVT) of left lower extremity ([place]) 01/24/2018   Acute deep vein thrombosis (DVT) of right lower extremity ([place]) 01/24/2018   Chronic allergic rhinitis 01/24/2018   Depression with anxiety 01/24/2018   UTI due to Klebsiella species 01/24/2018   Protein-calorie malnutrition, severe ([place]) 01/24/2018   S/P insertion of IVC (inferior vena caval) filter 01/24/2018   Reactive depression    Hypokalemia    Leukocytosis    Essential hypertension    Trauma    Tetraparesis ([facility])    Neuropathic pain    Neurogenic bowel    Neurogenic bladder    Benign essential HTN    Acute blood loss anemia    Central cord syndrome at C6 level of cervical spinal cord ([place]) 11/29/2017   Central cord syndrome ([place]) 11/29/2017    Lewis Moccasin, PT 12/18/2020, 11:01 AM  [place], [place], [address] Phone: [phone]   Fax:  [phone]  Name: Sherry [name] MRN:  [id number] Date of Birth: 1934/06/08

## 2020-12-22 ENCOUNTER — Ambulatory Visit: Payer: Medicare HMO | Admitting: Occupational Therapy

## 2020-12-22 ENCOUNTER — Encounter: Payer: Self-pay | Admitting: Occupational Therapy

## 2020-12-22 ENCOUNTER — Other Ambulatory Visit: Payer: Self-pay

## 2020-12-22 ENCOUNTER — Ambulatory Visit: Payer: Medicare HMO

## 2020-12-22 DIAGNOSIS — M6281 Muscle weakness (generalized): Secondary | ICD-10-CM

## 2020-12-22 DIAGNOSIS — R2681 Unsteadiness on feet: Secondary | ICD-10-CM

## 2020-12-22 DIAGNOSIS — R262 Difficulty in walking, not elsewhere classified: Secondary | ICD-10-CM

## 2020-12-22 DIAGNOSIS — R269 Unspecified abnormalities of gait and mobility: Secondary | ICD-10-CM

## 2020-12-22 DIAGNOSIS — R278 Other lack of coordination: Secondary | ICD-10-CM

## 2020-12-22 NOTE — Therapy (Signed)
[place] MAIN [facility] SERVICES [address] [place], [place], [address] Phone: [phone]   Fax:  [phone]  Occupational Therapy Treatment  Patient Details  Name: Sherry [name] MRN: [id number] Date of Birth: Sep 25, 1934 No data recorded  Encounter Date: 12/22/2020   OT End of Session - 12/22/20 1222     Visit Number 87    Number of Visits 96    Date for OT Re-Evaluation 01/12/21    Authorization Time Period Progress report period starting 11/26/2020    OT Start Time 1100    OT Stop Time 1145    OT Time Calculation (min) 45 min    Activity Tolerance Patient tolerated treatment well    Behavior During Therapy [facility] for tasks assessed/performed             Past Medical History:  Diagnosis Date   Acute blood loss anemia    Arthritis    Cancer ([facility])    skin   Central cord syndrome at C6 level of cervical spinal cord ([place]) 11/29/2017   Hypertension    Protein-calorie malnutrition, severe ([place]) 01/24/2018   S/P insertion of IVC (inferior vena caval) filter 01/24/2018   Tetraparesis ([place])     Past Surgical History:  Procedure Laterality Date   ANTERIOR CERVICAL DECOMP/DISCECTOMY FUSION N/A 11/29/2017   Procedure: Cervical five-six, six-seven Anterior Cervical Decompression Fusion;  Surgeon: Judith Part, MD;  Location: [facility];  Service: Neurosurgery;  Laterality: N/A;  Cervical five-six, six-seven Anterior Cervical Decompression Fusion   CATARACT EXTRACTION     IR IVC FILTER PLMT / S&I /IMG GUID/MOD SED  12/08/2017    There were no vitals filed for this visit.   Subjective Assessment - 12/22/20 1221     Subjective  Pt. reports that she is doing well today.    Patient is accompanied by: Family member    Pertinent History Patient s/p fall November 29, 2017 resulting in diagnosis of central cord syndrome at C 6 level.  She has had therapy in multiple venues but with recent move to [facility], therapy staff recommended she  seek outpatient therapy for her needs.    Currently in Pain? No/denies            OT TREATMENT     Pt. tolerated bilateral shoulder flexion, extension, abduction, elbow flexion, extension forearm supination AAROM/AROM. PROM bilateral wrist extension, digit MP, PIP, and DIP flexion, and extension, thumb radial, and palmar abduction in conjunction with moist heat. Pt. worked on [employer], and reciprocal motion using the UBE while sitting for 8 min. with minimal resistance. Constant monitoring was provided. Pt. worked on bilateral UE reaching up, and out for narrow cones with trunk rotation while crossing midline to place them on a lower surface. Pt. worked on reaching out wide into abduction, and well as reaching up into flexion for the narrow cones.   Pt. continues to make progress overall, and is improving with BUE reaching in preparation for improving overhead ADLs, and haircare.  Pt. was able to tolerate the UBE for 8 min. with the UBE elevated for increased ROM. Pt. Was able to perform reps of reaching with trunk rotation with the bilateral UE's. Pt. tolerated ROM well without reports of pain, or discomfort. Pt. continues to present with bilateral wrist flexor tightness, and digit extensor tightness. Pt. continues to benefit from working on impoving ROM in order to work towards increasing engagement of her bilateral hands during ADLs, and IADL tasks.  OT Education - 12/22/20 1221     Education Details BUE functioning    Person(s) Educated Patient;Caregiver(s)    Methods Explanation    Comprehension Verbalized understanding;Need further instruction                 OT Long Term Goals - 10/20/20 1302       OT LONG TERM GOAL #1   Title Patient and caregiver will demonstrate understanding of home exercise program for ROM.    Baseline Pt. continues to have a restorative aide rehab aide assist her wih exercises at [facility]. 8/8:  Independently completes/directs HEP mulitple times/week    Time 12    Period Weeks    Status Partially Met    Target Date 01/12/21      OT LONG TERM GOAL #4   Title Patient will demonstrate improved composite finger flexion to hold deodorant to apply to underarms.    Baseline 10/20/2020: Pt. conitnues to make steady progress with bilateral digit flexion.Pt. conitnues to present with stiifness/tightness which is hindering pt.'s ability to formulate a full composite fist. Pt. continues to present with improving finger flexion of R hand for a partial gross grasp, but unable to grasp and hold items. pt. conitnues to present with limited digit flexion of L hand. Pt. has active left thumb abduction. 8/8: holds cup with 2 hands, continues to have limited composite finger flexion L worse than R    Time 12    Period Weeks    Status On-going    Target Date 01/12/21      OT LONG TERM GOAL #6   Title Patient will increase right UE ROM to comb the back of her hair with modified independence.    Baseline 10/20/2020: Pt. continues to progress wtith RRUE ROM, and is able to use a pic for the back of her head, however is unable to brush the back of her hair. 07/30/2020: Pt. is now able to comb the back of her hair with a pic, however continues to work on completing the task efficiently. 8/8: Achieves with great effort, has assistance for efficiancy    Time 12    Period Weeks    Status Partially Met    Target Date 01/12/21      OT LONG TERM GOAL #7   Title Pt. will independently, and efficiently send text messages on her phone.    Baseline 10/20/2020: Pt. is now able to type a text message on the phone, however not efficiently taking increased time to complete. 07/30/2020: Pt. continues to present with limited BUE strength, and [facility] skills. 8/8: independtly drafts text messages, continues to work on accuracy and efficiancy 2/2 limited [facility]    Time 12    Period Weeks    Status Partially Met    Target Date 01/12/21       OT LONG TERM GOAL #8   Title Pt. will write, and sign her name with 100% legibility, and modified independence.    Baseline 10/20/2020: Pt. continues to make progress overall. Pt. continues to consistently fill out her daily menu, and writing for crossword puzzles. Pt. is able to maintain grasp on a wide width pen. [place] to work on [employer]. 8/8: writes daily, continues to work on improving speed    Time 12    Period Weeks    Status Partially Met    Target Date 01/12/21  Plan - 12/22/20 1222     Clinical Impression Statement Pt. continues to make progress overall, and is improving with BUE reaching in preparation for improving overhead ADLs, and haircare.  Pt. was able to tolerate the UBE for 8 min. with the UBE elevated for increased ROM. Pt. Was able to perform reps of reaching with trunk rotation with the bilateral UE's. Pt. tolerated ROM well without reports of pain, or discomfort. Pt. continues to present with bilateral wrist flexor tightness, and digit extensor tightness. Pt. continues to benefit from working on impoving ROM in order to work towards increasing engagement of her bilateral hands during ADLs, and IADL tasks.    OT Occupational Profile and History Detailed Assessment- Review of Records and additional review of physical, cognitive, psychosocial history related to current functional performance    Occupational performance deficits (Please refer to evaluation for details): ADL's;IADL's;Leisure;Social Participation    Body Structure / Function / Physical Skills ADL;Continence;Dexterity;Flexibility;Strength;ROM;Balance;Coordination;FMC;IADL;Endurance;UE functional use;Decreased knowledge of use of DME;GMC    Psychosocial Skills Coping Strategies    Rehab Potential Fair    Clinical Decision Making Several treatment options, min-mod task modification necessary    Comorbidities Affecting Occupational Performance: Presence of comorbidities  impacting occupational performance    Comorbidities impacting occupational performance description: contractures of bilateral hands, dependent transfers,    Modification or Assistance to Complete Evaluation  No modification of tasks or assist necessary to complete eval    OT Frequency 2x / week    OT Duration 12 weeks    OT Treatment/Interventions Self-care/ADL training;Cryotherapy;Therapeutic exercise;DME and/or AE instruction;Balance training;Neuromuscular education;Manual Therapy;Splinting;Moist Heat;Passive range of motion;Therapeutic activities;Patient/family education    Consulted and Agree with Plan of Care Patient             Patient will benefit from skilled therapeutic intervention in order to improve the following deficits and impairments:   Body Structure / Function / Physical Skills: ADL, Continence, Dexterity, Flexibility, Strength, ROM, Balance, Coordination, FMC, IADL, Endurance, UE functional use, Decreased knowledge of use of DME, GMC   Psychosocial Skills: Coping Strategies   Visit Diagnosis: Muscle weakness (generalized)  Other lack of coordination    Problem List Patient Active Problem List   Diagnosis Date Noted   Acute appendicitis with appendiceal abscess 05/30/2018   Hypocalcemia 02/15/2018   Dysuria 02/15/2018   Bilateral lower extremity edema 02/11/2018   Vaginal yeast infection 01/30/2018   Chest tightness 01/27/2018   Healthcare-associated pneumonia 01/25/2018   Pleural effusion, not elsewhere classified 01/25/2018   Acute deep vein thrombosis (DVT) of left lower extremity ([place]) 01/24/2018   Acute deep vein thrombosis (DVT) of right lower extremity ([place]) 01/24/2018   Chronic allergic rhinitis 01/24/2018   Depression with anxiety 01/24/2018   UTI due to Klebsiella species 01/24/2018   Protein-calorie malnutrition, severe ([place]) 01/24/2018   S/P insertion of IVC (inferior vena caval) filter 01/24/2018   Reactive depression    Hypokalemia     Leukocytosis    Essential hypertension    Trauma    Tetraparesis ([facility])    Neuropathic pain    Neurogenic bowel    Neurogenic bladder    Benign essential HTN    Acute blood loss anemia    Central cord syndrome at C6 level of cervical spinal cord ([place]) 11/29/2017   Central cord syndrome ([place]) 11/29/2017    Harrel Carina, MS, OTR/L 12/22/2020, 12:25 PM  [place] MAIN [facility] SERVICES [address] [place], [place], [address] Phone: [phone]   Fax:  [phone]  Name: Sherry [name] MRN: [id number] Date of Birth: 07/10/1934

## 2020-12-23 NOTE — Therapy (Signed)
[place] MAIN [facility] SERVICES [address] [place], [place], [address] Phone: [phone]   Fax:  [phone]  Physical Therapy Treatment/Recertification for dates 12/22/2020- 03/16/2021  Patient Details  Name: Sherry [name] MRN: [id number] Date of Birth: 1934-07-09 No data recorded  Encounter Date: 12/22/2020   PT End of Session - 12/22/20 1745     Visit Number 111    Number of Visits 136    Date for PT Re-Evaluation 03/16/21    Authorization Time Period Recert 05/04/8766- 02/16/7260; PN on 0/04/5595; Recert 05/18/3843- 36/46/8032; PN on 02/23/4823; Recert 00/37/0488- 8/91/6945    PT Start Time 1145    PT Stop Time 1225    PT Time Calculation (min) 40 min    Equipment Utilized During Treatment Gait belt    Activity Tolerance Patient tolerated treatment well;Patient limited by fatigue;Patient limited by pain    Behavior During Therapy [facility] for tasks assessed/performed             Past Medical History:  Diagnosis Date   Acute blood loss anemia    Arthritis    Cancer ([place])    skin   Central cord syndrome at C6 level of cervical spinal cord ([place]) 11/29/2017   Hypertension    Protein-calorie malnutrition, severe ([place]) 01/24/2018   S/P insertion of IVC (inferior vena caval) filter 01/24/2018   Tetraparesis ([place])     Past Surgical History:  Procedure Laterality Date   ANTERIOR CERVICAL DECOMP/DISCECTOMY FUSION N/A 11/29/2017   Procedure: Cervical five-six, six-seven Anterior Cervical Decompression Fusion;  Surgeon: Judith Part, MD;  Location: [place];  Service: Neurosurgery;  Laterality: N/A;  Cervical five-six, six-seven Anterior Cervical Decompression Fusion   CATARACT EXTRACTION     IR IVC FILTER PLMT / S&I /IMG GUID/MOD SED  12/08/2017    There were no vitals filed for this visit.   Subjective Assessment - 12/22/20 1732     Subjective Patient reports doing well today with no new complaints. Denies any pain    Patient is  accompained by: --   [name] (aide)   Pertinent History Pt is an 86 yo female that fell in 2019, fractured vertebrae in her neck and in her low back per family. 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 After hospital stay, pt discharged to [facility] for ~1 month, experienced severe UTI as well as bilateral DVT (IVC filter placed). Discharged to [facility], stayed for about a year, and then moved to [facility] in April 2021. Was receiving PT, but per family facility reported that did not have adequate equipment to maximize PT for her. Pt until about 1 month ago was practicing sit to stand transfers with 1-2 people, and working on static standing. Has a brace for L foot due to PF. Pt currently needs assistance with all ADLs (able to assist with donning/doffing her shirt), bed baths, and needs a hoyer lift for transfers. Able to drive power wheelchair without assistance.    Limitations Standing;Walking;House hold activities;Lifting    How long can you sit comfortably? NA    How long can you stand comfortably? unable    How long can you walk comfortably? unable    Patient Stated Goals "to stand up and walk"    Currently in Pain? No/denies             INTERVENTIONS:   Reassessed appropriate goals today for recert visit. Patient remains NWB on left Ankle so unable to address mobility goals- transfers/standing.  FOTO score unchanged most likely due to very limited ability current as she remains NWB and treatments are focusing on strengthening to prepare for return to weightbearing in the the near future.  Measured Right LE quad strength= able to extend knee to approx 35 deg from zero during long arc quad  Therapeutic exercises: Reclined exercises:  B quad sets -hold 5 sec x 15 reps - VC to maintain contraction B Heel slides BLE  2 sets x 15 reps (AAROM for optimal range)   Hip abduction/add with min physical resistance B LE 2 sets of 15 reps Seated Hip march- AROM x 15 reps each LE.  Then 2nd set with focus on eccentric control x 15 reps  Seated Knee ext w/ 3 second hold - AROM on left and AAROM on right LE. 1 set of 15 reps. 2nd set - focused on eccentric control.  -SLR- BLE 2 sets of 12 reps. 2nd set focused on eccentric control - Manual resistance   Education provided throughout session via VC/TC and demonstration to facilitate movement at target joints and correct muscle activation for all testing and exercises performed.   Clinical Impression: Patient has been limited as far as retesting functional goals as she remains left LE NWB at least through next week. She is demo improved overall LE strength and treatment today focused on more resistive and eccentric control. Added a new strength goal to continue to progress LE strength and will recertify patient with plan to hopefully resume some standing/transfer training during new cert as soon as cleared by MD. She  remains motivated and works hard trying to progress her strength while she remains NWB. Patient's condition has the potential to improve in response to therapy. Maximum improvement is yet to be obtained. The anticipated improvement is attainable and reasonable in a generally predictable time.   Pt will benefit from skilled physical therapy to increase mobility and strength to improve patient's quality of life                             PT Education - 12/22/20 1744     Education Details Rationale for Plan of care including recertificaiton; Edu on benefits of resistive strengthening and eccentric strengthening    Person(s) Educated Patient    Methods Explanation;Demonstration;Tactile cues;Verbal cues    Comprehension Verbalized understanding;Returned demonstration;Verbal cues required;Need further instruction;Tactile cues required              PT Short Term Goals - 03/10/20 2211       PT SHORT TERM GOAL #1   Title The patient will perform initial HEP with minimum assistance in order to  improve strength and function.    Baseline Patient demonstrating independence with initial HEP as of 03/10/2020 with no questions or difficulty.    Time 4    Period Weeks    Status Achieved    Target Date 02/11/20               PT Long Term Goals - 12/22/20 1155       PT LONG TERM GOAL #1   Title The patient will be compliant with finalized HEP with minimum assistance in preparation for self management and maintenance of condition.    Baseline 03/10/2020- Patient familiar with initial HEP and will keep goal active as HEP becomes more progressive including possible transfers and standing activities. 04/07/2020- Patient continues to report compliance with LE strengthening home exercises without questions or  concerns. 04/21/2020- Patient able to verbalize and demonstrate good understanding of current HEP including some supine and seated LE strengthening exercises. Reviewed and patient performed 10 reps today with minimal cueing. Will keep goal active to progress HEP as appropriate. 05/28/2020- patient reports continues to perform LE strengthening as able and some help from caregiver as able. No changes at this time to home program as patient is limited to supine/seated therex. 07/02/2020- Patient continues to report compliance with current supine and seated LE strengtheing home program and states no questions or concerns regarding her plan.    Time 12    Period Weeks    Status Achieved      PT LONG TERM GOAL #2   Title The patient will demonstrate at least 10 point improvement on FOTO score indicating an improved ability to perform functional activities.    Baseline on eval (/720) score 12; 10/22/19: 12; 12/10/19: 18, 12/13: 18: 04/07/2020- Will assess next visit. 07/02/2020- Will obtain next visit. 09/24/2020= Will obtain next visit. 11/12/2020= 12; 12/22/2020= 12    Time 12    Period Weeks    Status On-going    Target Date 03/16/21      PT LONG TERM GOAL #3   Title The patient will demonstrate  lateral scooting  for assistance with transfers with minA to maximize independence.    Baseline 10/22/19: Pt requires modA+1 for lateral scooting; 12/10/19: modA+1, 12/13: modA-maxA, 12/27: modA-maxA, 03/10/2020- ModA-MaxA- increased verbal cues and visual demonstration. 04/07/2020- Continued Max assist to perform forward/lateral scoot. 04/21/2020- Patient was able to demo slight lateral scoot with CGA approx 12 in then fatigued requiring max assist- she is able to scoot forward by leaning without significant issues. 05/28/2020- While sitting in chair- patient able to perform forward and lateral scoot with VC/visual demo and increased time allowed- CGA today but not consistent yet- will keep goal active at this time to continue to focus on strengthening and improving technique. 07/02/2020- Patient able to demonstrate minimal ability to laterally shift weigh on mat table requiring max assist yet is able to forward scoot out to edge of mat table with min Assist. 08/20/2020 - Patient was able to demonstrate minimal lateral scooting at edge of mat while holding feet still so they did not slip on tile surface. She continues to be able to demonstrate scoot forward and uses tilt option to recline to position safely back into seat of power wheelchair. 09/25/2020- Patient able to sit on mat and minimally scoot laterally but requires min/mod A for efficient scooting. Will end goal at this time due to plateau in progress with scooting over extended time.    Time 12    Period Weeks    Status Not Met      PT LONG TERM GOAL #4   Title The patient will demonstrate a squat pivot transfer with minimum assistance to maximize independence and mobility.    Baseline 10/22/19: unable to safely attempt at this time; 12/10/19: unable to safely attempt at this time, 01/14/20: unable to safety attempt at this time. 03/10/2020- Patient able to perform Stand pivot transfer with Maximal assist. 04/07/2020- Patient continues to require Max assist with  sit to stand- will assist SPT next visit. 04/21/2020- Patient presents with Max assist to perform Sit to stand Transfer- unable to pivot or move Left LE well without difficulty. 07/02/2020= Max assist with SPT and minimal ability to pivot. 7/202/2022-Patient able to stand with minimal assist this visit (as good as CGA last visit) and able  to move right foot to pivot and performing with moderate assist today. 09/24/2020- Patient able to perform sit to stand with Min assist from lift chair. Patient has demo inconsistency and will keep goal active.  12/17/2020- Goal is currently not appropriate as patient fractured her ankle and currently NWB. 12/22/2020=Goal is currently not appropriate as patient fractured her ankle and currently NWB.    Time 12    Period Weeks    Status On-going    Target Date 03/16/21      PT LONG TERM GOAL #5   Title The patient will demonstrate sitting without UE support for 2-5 minutes at EOB to improve participation and maximize independence with ADLs.    Baseline 10/22/19: Pt able to sit unsupported for at least 2 minutes at edge of bed. 04/21/2020- Patient continues to demo good sitting at edge of mat > 5 miin without difficulty or back support.    Time 12    Period Weeks    Status Achieved      Additional Long Term Goals   Additional Long Term Goals Yes      PT LONG TERM GOAL #6   Title Patient will demonstrate improved functional LE strength as seen by consistent ability to stand > 2 min (3 out of 4 trials) with Mod/Max A for improved LE strength with transfers.    Baseline 05/28/2020- Patient able to inconsistent stand 1-2 min right now in //bars with Mod/Max A. 07/02/2020- Patient was able to progress last 2 visit to standing using bilateral platform attachment between 1-2 min which is a progression from the parallel bars. 08/20/2020- Patient performed static standing with min- mod A using bilateral platform attachment on walker for 6 min 30 sec today with assist for trunk control  and intermittent verbal cues for posture. 09/24/2020- Patient has consistently been able to stand >2 min in // bars and when using bilateral Platform walker. 12/23/2020= Will reopen this goal as patient should transition back to weightbearing and again practice standing in new cert.    Time 12    Period Weeks    Status Deferred    Target Date 03/16/21      PT LONG TERM GOAL #7   Title Patient/caregiver will demonstrate assisted transfer using [place] Lift consistently Independently for improved ability to transfer at home from lift chair to bed.    Baseline 09/24/2020- Patient requires hoyer lift (Dependent) for safe transfers.  12/17/2020- Patient currently still dependent on hoyer as she is currently non-weight bearing due to recent ankle fracture.    Time 12    Period Weeks    Status On-going    Target Date 03/16/21      PT LONG TERM GOAL #8   Title Patient will demo improved right LE quad strength as seen by ability to extend knee to 20 deg from 0 or less while performing a Long arc quad (for improved terminal knee ext and more full ROM)  for improved ability to bearing weight with Right LE.    Baseline 12/22/2020=able to extend knee to approx 35 deg from zero during long arc quad    Time 12    Period Weeks    Status New    Target Date 03/16/20                   Plan - 12/22/20 1748     Clinical Impression Statement Patient has been limited as far as retesting functional goals as she remains left LE NWB  at least through next week. She is demo improved overall LE strength and treatment today focused on more resistive and eccentric control. Added a new strength goal to continue to progress LE strength and will recertify patient with plan to hopefully resume some standing/transfer training during new cert as soon as cleared by MD. She  remains motivated and works hard trying to progress her strength while she remains NWB. Patient's condition has the potential to improve in response  to therapy. Maximum improvement is yet to be obtained. The anticipated improvement is attainable and reasonable in a generally predictable time.   Pt will benefit from skilled physical therapy to increase mobility and strength to improve patient's quality of life    Personal Factors and Comorbidities Age;Time since onset of injury/illness/exacerbation;Comorbidity 3+;Fitness    Comorbidities HTN, quadriparesis, history of DVT, neurogenic bladder    Examination-Activity Limitations Bathing;Hygiene/Grooming;Squat;Bed Mobility;Lift;Bend;Stand;Engineer, manufacturing;Toileting;Self Feeding;Transfers;Continence;Sit;Dressing;Sleep;Carry    Examination-Participation Restrictions Church;Laundry;Cleaning;Medication Management;Community Activity;Meal Prep;Interpersonal Relationship    Stability/Clinical Decision Making Evolving/Moderate complexity    Rehab Potential Fair    PT Frequency 2x / week    PT Duration 12 weeks    PT Treatment/Interventions ADLs/Self Care Home Management;Electrical Stimulation;Therapeutic activities;Wheelchair mobility training;Vasopneumatic Device;Joint Manipulations;Vestibular;Passive range of motion;Patient/family education;Therapeutic exercise;DME Instruction;Biofeedback;Aquatic Therapy;Moist Heat;Gait training;Balance training;Orthotic Fit/Training;Dry needling;Energy conservation;Taping;Splinting;Neuromuscular re-education;Cryotherapy;Ultrasound;Functional mobility training    PT Next Visit Plan Continue with progressive standing/transfer training once MD approved    PT Home Exercise Plan no changes    Consulted and Agree with Plan of Care Patient             Patient will benefit from skilled therapeutic intervention in order to improve the following deficits and impairments:  Abnormal gait, Decreased balance, Decreased endurance, Decreased mobility, Difficulty walking, Hypomobility, Impaired sensation, Decreased range of motion, Improper body mechanics, Impaired  perceived functional ability, Decreased activity tolerance, Decreased knowledge of use of DME, Decreased safety awareness, Decreased strength, Impaired flexibility, Impaired UE functional use, Postural dysfunction  Visit Diagnosis: Abnormality of gait and mobility  Difficulty in walking, not elsewhere classified  Muscle weakness (generalized)  Unsteadiness on feet     Problem List Patient Active Problem List   Diagnosis Date Noted   Acute appendicitis with appendiceal abscess 05/30/2018   Hypocalcemia 02/15/2018   Dysuria 02/15/2018   Bilateral lower extremity edema 02/11/2018   Vaginal yeast infection 01/30/2018   Chest tightness 01/27/2018   Healthcare-associated pneumonia 01/25/2018   Pleural effusion, not elsewhere classified 01/25/2018   Acute deep vein thrombosis (DVT) of left lower extremity ([place]) 01/24/2018   Acute deep vein thrombosis (DVT) of right lower extremity ([facility]) 01/24/2018   Chronic allergic rhinitis 01/24/2018   Depression with anxiety 01/24/2018   UTI due to Klebsiella species 01/24/2018   Protein-calorie malnutrition, severe ([place]) 01/24/2018   S/P insertion of IVC (inferior vena caval) filter 01/24/2018   Reactive depression    Hypokalemia    Leukocytosis    Essential hypertension    Trauma    Tetraparesis ([facility])    Neuropathic pain    Neurogenic bowel    Neurogenic bladder    Benign essential HTN    Acute blood loss anemia    Central cord syndrome at C6 level of cervical spinal cord ([place]) 11/29/2017   Central cord syndrome ([place]) 11/29/2017    Lewis Moccasin, PT 12/23/2020, 9:27 AM  [place] MAIN [facility] SERVICES [address] [place], [place], [address] Phone: [phone]   Fax:  [phone]  Name: Sherry [name]  MRN: [id number] Date of Birth: Jul 18, 1934

## 2020-12-24 ENCOUNTER — Other Ambulatory Visit: Payer: Self-pay

## 2020-12-24 ENCOUNTER — Ambulatory Visit: Payer: Medicare HMO | Admitting: Occupational Therapy

## 2020-12-24 ENCOUNTER — Encounter: Payer: Self-pay | Admitting: Occupational Therapy

## 2020-12-24 ENCOUNTER — Ambulatory Visit: Payer: Medicare HMO

## 2020-12-24 DIAGNOSIS — M6281 Muscle weakness (generalized): Secondary | ICD-10-CM | POA: Diagnosis not present

## 2020-12-24 DIAGNOSIS — R269 Unspecified abnormalities of gait and mobility: Secondary | ICD-10-CM

## 2020-12-24 DIAGNOSIS — R2681 Unsteadiness on feet: Secondary | ICD-10-CM

## 2020-12-24 DIAGNOSIS — R278 Other lack of coordination: Secondary | ICD-10-CM

## 2020-12-24 DIAGNOSIS — R262 Difficulty in walking, not elsewhere classified: Secondary | ICD-10-CM

## 2020-12-24 NOTE — Therapy (Signed)
[place] MAIN [facility] SERVICES [address] [place], [place], [address] Phone: [phone]   Fax:  [phone]  Occupational Therapy Treatment  Patient Details  Name: Sherry [name] MRN: [id number] Date of Birth: 02/16/1934 No data recorded  Encounter Date: 12/24/2020   OT End of Session - 12/24/20 1539     Visit Number 88    Number of Visits 96    Date for OT Re-Evaluation 01/12/21    Authorization Time Period Progress report period starting 11/26/2020    OT Start Time 1100    OT Stop Time 1145    OT Time Calculation (min) 45 min    Activity Tolerance Patient tolerated treatment well    Behavior During Therapy [facility] for tasks assessed/performed             Past Medical History:  Diagnosis Date   Acute blood loss anemia    Arthritis    Cancer ([place])    skin   Central cord syndrome at C6 level of cervical spinal cord ([place]) 11/29/2017   Hypertension    Protein-calorie malnutrition, severe ([place]) 01/24/2018   S/P insertion of IVC (inferior vena caval) filter 01/24/2018   Tetraparesis ([place])     Past Surgical History:  Procedure Laterality Date   ANTERIOR CERVICAL DECOMP/DISCECTOMY FUSION N/A 11/29/2017   Procedure: Cervical five-six, six-seven Anterior Cervical Decompression Fusion;  Surgeon: Judith Part, MD;  Location: [place];  Service: Neurosurgery;  Laterality: N/A;  Cervical five-six, six-seven Anterior Cervical Decompression Fusion   CATARACT EXTRACTION     IR IVC FILTER PLMT / S&I /IMG GUID/MOD SED  12/08/2017    There were no vitals filed for this visit.   Subjective Assessment - 12/24/20 1538     Subjective  Pt. reports that she is doing well today.    Patient is accompanied by: Family member    Pertinent History Patient s/p fall November 29, 2017 resulting in diagnosis of central cord syndrome at C 6 level.  She has had therapy in multiple venues but with recent move to [facility], therapy staff recommended she  seek outpatient therapy for her needs.    Patient Stated Goals Patient would like to be able to move better in bed, stand and perform self care tasks.    Currently in Pain? No/denies            OT TREATMENT     Pt. tolerated bilateral shoulder flexion, extension, abduction, elbow flexion, extension forearm supination AAROM/AROM. PROM bilateral wrist extension, digit MP, PIP, and DIP flexion, and extension, thumb radial, and palmar abduction in conjunction with moist heat. Pt. worked on [employer], and reciprocal motion using the UBE while sitting for 8 min. with minimal resistance.    Pt. continues to make progress overall, and is improving with BUE reaching in preparation for improving overhead ADLs, and haircare. Pt. was able to tolerate the UBE for 8 min. with the UBE elevated for increased ROM. Pt. continues to tolerate ROM well without reports of pain, or discomfort. Pt. continues to present with bilateral wrist flexor tightness, and digit extensor tightness. Pt. continues to benefit from working on impoving ROM in order to work towards increasing engagement of her bilateral hands during ADLs, and IADL tasks.                          OT Education - 12/24/20 1538     Education Details BUE functioning  Person(s) Educated Patient;Caregiver(s)    Methods Explanation    Comprehension Verbalized understanding;Need further instruction                 OT Long Term Goals - 10/20/20 1302       OT LONG TERM GOAL #1   Title Patient and caregiver will demonstrate understanding of home exercise program for ROM.    Baseline Pt. continues to have a restorative aide rehab aide assist her wih exercises at [facility]. 8/8: Independently completes/directs HEP mulitple times/week    Time 12    Period Weeks    Status Partially Met    Target Date 01/12/21      OT LONG TERM GOAL #4   Title Patient will demonstrate improved composite finger flexion to hold  deodorant to apply to underarms.    Baseline 10/20/2020: Pt. conitnues to make steady progress with bilateral digit flexion.Pt. conitnues to present with stiifness/tightness which is hindering pt.'s ability to formulate a full composite fist. Pt. continues to present with improving finger flexion of R hand for a partial gross grasp, but unable to grasp and hold items. pt. conitnues to present with limited digit flexion of L hand. Pt. has active left thumb abduction. 8/8: holds cup with 2 hands, continues to have limited composite finger flexion L worse than R    Time 12    Period Weeks    Status On-going    Target Date 01/12/21      OT LONG TERM GOAL #6   Title Patient will increase right UE ROM to comb the back of her hair with modified independence.    Baseline 10/20/2020: Pt. continues to progress wtith RRUE ROM, and is able to use a pic for the back of her head, however is unable to brush the back of her hair. 07/30/2020: Pt. is now able to comb the back of her hair with a pic, however continues to work on completing the task efficiently. 8/8: Achieves with great effort, has assistance for efficiancy    Time 12    Period Weeks    Status Partially Met    Target Date 01/12/21      OT LONG TERM GOAL #7   Title Pt. will independently, and efficiently send text messages on her phone.    Baseline 10/20/2020: Pt. is now able to type a text message on the phone, however not efficiently taking increased time to complete. 07/30/2020: Pt. continues to present with limited BUE strength, and [facility] skills. 8/8: independtly drafts text messages, continues to work on accuracy and efficiancy 2/2 limited [facility]    Time 12    Period Weeks    Status Partially Met    Target Date 01/12/21      OT LONG TERM GOAL #8   Title Pt. will write, and sign her name with 100% legibility, and modified independence.    Baseline 10/20/2020: Pt. continues to make progress overall. Pt. continues to consistently fill out her daily menu,  and writing for crossword puzzles. Pt. is able to maintain grasp on a wide width pen. [place] to work on [employer]. 8/8: writes daily, continues to work on improving speed    Time 12    Period Weeks    Status Partially Met    Target Date 01/12/21                   Plan - 12/24/20 1539     Clinical Impression Statement Pt. continues to make  progress overall, and is improving with BUE reaching in preparation for improving overhead ADLs, and haircare.  Pt. was able to tolerate the UBE for 8 min. with the UBE elevated for increased ROM. Pt. Continues to tolerate ROM well without reports of pain, or discomfort. Pt. continues to present with bilateral wrist flexor tightness, and digit extensor tightness. Pt. continues to benefit from working on impoving ROM in order to work towards increasing engagement of her bilateral hands during ADLs, and IADL tasks.    OT Occupational Profile and History Detailed Assessment- Review of Records and additional review of physical, cognitive, psychosocial history related to current functional performance    Occupational performance deficits (Please refer to evaluation for details): ADL's;IADL's;Leisure;Social Participation    Body Structure / Function / Physical Skills ADL;Continence;Dexterity;Flexibility;Strength;ROM;Balance;Coordination;FMC;IADL;Endurance;UE functional use;Decreased knowledge of use of DME;GMC    Psychosocial Skills Coping Strategies    Rehab Potential Fair    Clinical Decision Making Several treatment options, min-mod task modification necessary    Comorbidities Affecting Occupational Performance: Presence of comorbidities impacting occupational performance    Comorbidities impacting occupational performance description: contractures of bilateral hands, dependent transfers,    Modification or Assistance to Complete Evaluation  No modification of tasks or assist necessary to complete eval    OT Frequency 2x / week    OT Duration  12 weeks    OT Treatment/Interventions Self-care/ADL training;Cryotherapy;Therapeutic exercise;DME and/or AE instruction;Balance training;Neuromuscular education;Manual Therapy;Splinting;Moist Heat;Passive range of motion;Therapeutic activities;Patient/family education    Consulted and Agree with Plan of Care Patient             Patient will benefit from skilled therapeutic intervention in order to improve the following deficits and impairments:   Body Structure / Function / Physical Skills: ADL, Continence, Dexterity, Flexibility, Strength, ROM, Balance, Coordination, FMC, IADL, Endurance, UE functional use, Decreased knowledge of use of DME, GMC   Psychosocial Skills: Coping Strategies   Visit Diagnosis: Muscle weakness (generalized)  Other lack of coordination    Problem List Patient Active Problem List   Diagnosis Date Noted   Acute appendicitis with appendiceal abscess 05/30/2018   Hypocalcemia 02/15/2018   Dysuria 02/15/2018   Bilateral lower extremity edema 02/11/2018   Vaginal yeast infection 01/30/2018   Chest tightness 01/27/2018   Healthcare-associated pneumonia 01/25/2018   Pleural effusion, not elsewhere classified 01/25/2018   Acute deep vein thrombosis (DVT) of left lower extremity ([place]) 01/24/2018   Acute deep vein thrombosis (DVT) of right lower extremity ([place]) 01/24/2018   Chronic allergic rhinitis 01/24/2018   Depression with anxiety 01/24/2018   UTI due to Klebsiella species 01/24/2018   Protein-calorie malnutrition, severe ([place]) 01/24/2018   S/P insertion of IVC (inferior vena caval) filter 01/24/2018   Reactive depression    Hypokalemia    Leukocytosis    Essential hypertension    Trauma    Tetraparesis ([facility])    Neuropathic pain    Neurogenic bowel    Neurogenic bladder    Benign essential HTN    Acute blood loss anemia    Central cord syndrome at C6 level of cervical spinal cord ([place]) 11/29/2017   Central cord syndrome ([place]) 11/29/2017     Harrel Carina, MS, OTR/L 12/24/2020, 3:41 PM  [place] MAIN [facility] SERVICES [address] [place], [place], [address] Phone: [phone]   Fax:  [phone]  Name: Sherry [name] MRN: [id number] Date of Birth: 06/20/1934

## 2020-12-24 NOTE — Therapy (Signed)
[place] MAIN [facility] SERVICES [address] [place], [place], [address] Phone: [phone]   Fax:  [phone]  Physical Therapy Treatment  Patient Details  Name: Sherry [name] MRN: [id number] Date of Birth: [birth year] No data recorded  Encounter Date: 12/24/2020   PT End of Session - 12/24/20 1106     Visit Number 112    Number of Visits 136    Date for PT Re-Evaluation 03/16/21    Authorization Time Period Recert 10/04/8180- 9/93/7169; PN on 07/08/8936; Recert 02/02/7508- 25/85/2778; PN on 2/42/3536; Recert 14/43/1540- 0/86/7619    PT Start Time 1146    PT Stop Time 1228    PT Time Calculation (min) 42 min    Equipment Utilized During Treatment Gait belt    Activity Tolerance Patient tolerated treatment well;Patient limited by fatigue;Patient limited by pain    Behavior During Therapy [facility] for tasks assessed/performed             Past Medical History:  Diagnosis Date   Acute blood loss anemia    Arthritis    Cancer ([place])    skin   Central cord syndrome at C6 level of cervical spinal cord ([place]) 11/29/2017   Hypertension    Protein-calorie malnutrition, severe ([place]) 01/24/2018   S/P insertion of IVC (inferior vena caval) filter 01/24/2018   Tetraparesis ([place])     Past Surgical History:  Procedure Laterality Date   ANTERIOR CERVICAL DECOMP/DISCECTOMY FUSION N/A 11/29/2017   Procedure: Cervical five-six, six-seven Anterior Cervical Decompression Fusion;  Surgeon: Judith Part, MD;  Location: [place];  Service: Neurosurgery;  Laterality: N/A;  Cervical five-six, six-seven Anterior Cervical Decompression Fusion   CATARACT EXTRACTION     IR IVC FILTER PLMT / S&I /IMG GUID/MOD SED  12/08/2017    There were no vitals filed for this visit.   Subjective Assessment - 12/24/20 1104     Subjective Patient reports doing well again today- States she can feel her leg well and would be able to tell if she had any pain with standing. Ok  with waiting until next week to try.    Patient is accompained by: --   [name] (aide)   Pertinent History Pt is an 86 yo female that fell in 2019, fractured vertebrae in her neck and in her low back per family. 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 After hospital stay, pt discharged to [facility] for ~1 month, experienced severe UTI as well as bilateral DVT (IVC filter placed). Discharged to [facility], stayed for about a year, and then moved to [facility] in April 2021. Was receiving PT, but per family facility reported that did not have adequate equipment to maximize PT for her. Pt until about 1 month ago was practicing sit to stand transfers with 1-2 people, and working on static standing. Has a brace for L foot due to PF. Pt currently needs assistance with all ADLs (able to assist with donning/doffing her shirt), bed baths, and needs a hoyer lift for transfers. Able to drive power wheelchair without assistance.    Limitations Standing;Walking;House hold activities;Lifting    How long can you sit comfortably? NA    How long can you stand comfortably? unable    How long can you walk comfortably? unable    Patient Stated Goals "to stand up and walk"    Currently in Pain? No/denies              Per Dr. Silvio Pate in secure  chat on 12/23/2020  MD: I am trying to play it safe with the ankle fracture since she won't have pain as a guide. I think it is okay to start toe touch and light weight bearing next week--just take it slow.  PT: Thank you for the recommendation. I sure will do my best.  Not sure how much light weight bearing she will be able to perform as her left LE is her strong side and she has not been able to bear weight well on her right LE. Prior to her fracture - She was typically very dependent on her left LE for majority of standing and we were focusing on squat pivot transfers and static stand to improve her strength for transfers. I may just wait a little longer and  keep working on her strength. I definitely don't want to risk re-injury. I will may try an unweighing system to see if I can accomplish toe touch.     MD: sounds good. if that is the dependent side, you better wait.  PT: Ok. I will wait as her left side is very much her dependent side  INTERVENTIONS:   Therapeutic exercises: Reclined exercises:  B quad sets -hold 5 sec x 15 reps - VC to maintain contraction B Heel slides BLE  2 sets x 15 reps (AAROM for optimal range)   Hip abduction/add with min physical resistance B LE 2 sets of 15 reps Seated Hip march- AROM x 15 reps each LE. Then 2nd set with focus on eccentric control x 15 reps  Seated Knee ext w/ 3 second hold - AROM on left and AAROM on right LE. 1 set of 15 reps. 2nd set - focused on eccentric control.  -SLR- BLE 2 sets of 12 reps. 2nd set focused on eccentric control - Manual resistance   Education provided throughout session via VC/TC and demonstration to facilitate movement at target joints and correct muscle activation for all testing and exercises performed.    Clinical Impression:  Patient presented with good motivation today and responded well to strength training with increased focus on eccentric control. She was challenged with each leg eccentrically lowering with all exercises. She was very fatigued at end of session but did improve overall with practice today. Pt will benefit from skilled physical therapy to increase mobility and strength to improve patient's quality of life                     PT Education - 12/24/20 1105     Education Details Exercise technique    Person(s) Educated Patient    Methods Explanation;Demonstration;Tactile cues;Verbal cues    Comprehension Verbalized understanding;Returned demonstration;Verbal cues required;Tactile cues required              PT Short Term Goals - 03/10/20 2211       PT SHORT TERM GOAL #1   Title The patient will perform initial HEP with minimum  assistance in order to improve strength and function.    Baseline Patient demonstrating independence with initial HEP as of 03/10/2020 with no questions or difficulty.    Time 4    Period Weeks    Status Achieved    Target Date 02/11/20               PT Long Term Goals - 12/22/20 1155       PT LONG TERM GOAL #1   Title The patient will be compliant with finalized HEP with minimum assistance in  preparation for self management and maintenance of condition.    Baseline 03/10/2020- Patient familiar with initial HEP and will keep goal active as HEP becomes more progressive including possible transfers and standing activities. 04/07/2020- Patient continues to report compliance with LE strengthening home exercises without questions or concerns. 04/21/2020- Patient able to verbalize and demonstrate good understanding of current HEP including some supine and seated LE strengthening exercises. Reviewed and patient performed 10 reps today with minimal cueing. Will keep goal active to progress HEP as appropriate. 05/28/2020- patient reports continues to perform LE strengthening as able and some help from caregiver as able. No changes at this time to home program as patient is limited to supine/seated therex. 07/02/2020- Patient continues to report compliance with current supine and seated LE strengtheing home program and states no questions or concerns regarding her plan.    Time 12    Period Weeks    Status Achieved      PT LONG TERM GOAL #2   Title The patient will demonstrate at least 10 point improvement on FOTO score indicating an improved ability to perform functional activities.    Baseline on eval (/720) score 12; 10/22/19: 12; 12/10/19: 18, 12/13: 18: 04/07/2020- Will assess next visit. 07/02/2020- Will obtain next visit. 09/24/2020= Will obtain next visit. 11/12/2020= 12; 12/22/2020= 12    Time 12    Period Weeks    Status On-going    Target Date 03/16/21      PT LONG TERM GOAL #3   Title The  patient will demonstrate lateral scooting  for assistance with transfers with minA to maximize independence.    Baseline 10/22/19: Pt requires modA+1 for lateral scooting; 12/10/19: modA+1, 12/13: modA-maxA, 12/27: modA-maxA, 03/10/2020- ModA-MaxA- increased verbal cues and visual demonstration. 04/07/2020- Continued Max assist to perform forward/lateral scoot. 04/21/2020- Patient was able to demo slight lateral scoot with CGA approx 12 in then fatigued requiring max assist- she is able to scoot forward by leaning without significant issues. 05/28/2020- While sitting in chair- patient able to perform forward and lateral scoot with VC/visual demo and increased time allowed- CGA today but not consistent yet- will keep goal active at this time to continue to focus on strengthening and improving technique. 07/02/2020- Patient able to demonstrate minimal ability to laterally shift weigh on mat table requiring max assist yet is able to forward scoot out to edge of mat table with min Assist. 08/20/2020 - Patient was able to demonstrate minimal lateral scooting at edge of mat while holding feet still so they did not slip on tile surface. She continues to be able to demonstrate scoot forward and uses tilt option to recline to position safely back into seat of power wheelchair. 09/25/2020- Patient able to sit on mat and minimally scoot laterally but requires min/mod A for efficient scooting. Will end goal at this time due to plateau in progress with scooting over extended time.    Time 12    Period Weeks    Status Not Met      PT LONG TERM GOAL #4   Title The patient will demonstrate a squat pivot transfer with minimum assistance to maximize independence and mobility.    Baseline 10/22/19: unable to safely attempt at this time; 12/10/19: unable to safely attempt at this time, 01/14/20: unable to safety attempt at this time. 03/10/2020- Patient able to perform Stand pivot transfer with Maximal assist. 04/07/2020- Patient continues  to require Max assist with sit to stand- will assist SPT next visit. 04/21/2020-  Patient presents with Max assist to perform Sit to stand Transfer- unable to pivot or move Left LE well without difficulty. 07/02/2020= Max assist with SPT and minimal ability to pivot. 7/202/2022-Patient able to stand with minimal assist this visit (as good as CGA last visit) and able to move right foot to pivot and performing with moderate assist today. 09/24/2020- Patient able to perform sit to stand with Min assist from lift chair. Patient has demo inconsistency and will keep goal active.  12/17/2020- Goal is currently not appropriate as patient fractured her ankle and currently NWB. 12/22/2020=Goal is currently not appropriate as patient fractured her ankle and currently NWB.    Time 12    Period Weeks    Status On-going    Target Date 03/16/21      PT LONG TERM GOAL #5   Title The patient will demonstrate sitting without UE support for 2-5 minutes at EOB to improve participation and maximize independence with ADLs.    Baseline 10/22/19: Pt able to sit unsupported for at least 2 minutes at edge of bed. 04/21/2020- Patient continues to demo good sitting at edge of mat > 5 miin without difficulty or back support.    Time 12    Period Weeks    Status Achieved      Additional Long Term Goals   Additional Long Term Goals Yes      PT LONG TERM GOAL #6   Title Patient will demonstrate improved functional LE strength as seen by consistent ability to stand > 2 min (3 out of 4 trials) with Mod/Max A for improved LE strength with transfers.    Baseline 05/28/2020- Patient able to inconsistent stand 1-2 min right now in //bars with Mod/Max A. 07/02/2020- Patient was able to progress last 2 visit to standing using bilateral platform attachment between 1-2 min which is a progression from the parallel bars. 08/20/2020- Patient performed static standing with min- mod A using bilateral platform attachment on walker for 6 min 30 sec today  with assist for trunk control and intermittent verbal cues for posture. 09/24/2020- Patient has consistently been able to stand >2 min in // bars and when using bilateral Platform walker. 12/23/2020= Will reopen this goal as patient should transition back to weightbearing and again practice standing in new cert.    Time 12    Period Weeks    Status Deferred    Target Date 03/16/21      PT LONG TERM GOAL #7   Title Patient/caregiver will demonstrate assisted transfer using [place] Lift consistently Independently for improved ability to transfer at home from lift chair to bed.    Baseline 09/24/2020- Patient requires hoyer lift (Dependent) for safe transfers.  12/17/2020- Patient currently still dependent on hoyer as she is currently non-weight bearing due to recent ankle fracture.    Time 12    Period Weeks    Status On-going    Target Date 03/16/21      PT LONG TERM GOAL #8   Title Patient will demo improved right LE quad strength as seen by ability to extend knee to 20 deg from 0 or less while performing a Long arc quad (for improved terminal knee ext and more full ROM)  for improved ability to bearing weight with Right LE.    Baseline 12/22/2020=able to extend knee to approx 35 deg from zero during long arc quad    Time 12    Period Weeks    Status New  Target Date 03/16/20                   Plan - 12/24/20 1106     Clinical Impression Statement Patient presented with good motivation today and responded well to strength training with increased focus on eccentric control. She was challenged with each leg eccentrically lowering with all exercises. She was very fatigued at end of session but did improve overall with practice today. Pt will benefit from skilled physical therapy to increase mobility and strength to improve patient's quality of life    Personal Factors and Comorbidities Age;Time since onset of injury/illness/exacerbation;Comorbidity 3+;Fitness    Comorbidities HTN,  quadriparesis, history of DVT, neurogenic bladder    Examination-Activity Limitations Bathing;Hygiene/Grooming;Squat;Bed Mobility;Lift;Bend;Stand;Engineer, manufacturing;Toileting;Self Feeding;Transfers;Continence;Sit;Dressing;Sleep;Carry    Examination-Participation Restrictions Church;Laundry;Cleaning;Medication Management;Community Activity;Meal Prep;Interpersonal Relationship    Stability/Clinical Decision Making Evolving/Moderate complexity    Rehab Potential Fair    PT Frequency 2x / week    PT Duration 12 weeks    PT Treatment/Interventions ADLs/Self Care Home Management;Electrical Stimulation;Therapeutic activities;Wheelchair mobility training;Vasopneumatic Device;Joint Manipulations;Vestibular;Passive range of motion;Patient/family education;Therapeutic exercise;DME Instruction;Biofeedback;Aquatic Therapy;Moist Heat;Gait training;Balance training;Orthotic Fit/Training;Dry needling;Energy conservation;Taping;Splinting;Neuromuscular re-education;Cryotherapy;Ultrasound;Functional mobility training    PT Next Visit Plan Continue with progressive standing/transfer training once MD approved    PT Home Exercise Plan no changes    Consulted and Agree with Plan of Care Patient             Patient will benefit from skilled therapeutic intervention in order to improve the following deficits and impairments:  Abnormal gait, Decreased balance, Decreased endurance, Decreased mobility, Difficulty walking, Hypomobility, Impaired sensation, Decreased range of motion, Improper body mechanics, Impaired perceived functional ability, Decreased activity tolerance, Decreased knowledge of use of DME, Decreased safety awareness, Decreased strength, Impaired flexibility, Impaired UE functional use, Postural dysfunction  Visit Diagnosis: Abnormality of gait and mobility  Difficulty in walking, not elsewhere classified  Muscle weakness (generalized)  Unsteadiness on feet     Problem  List Patient Active Problem List   Diagnosis Date Noted   Acute appendicitis with appendiceal abscess 05/30/2018   Hypocalcemia 02/15/2018   Dysuria 02/15/2018   Bilateral lower extremity edema 02/11/2018   Vaginal yeast infection 01/30/2018   Chest tightness 01/27/2018   Healthcare-associated pneumonia 01/25/2018   Pleural effusion, not elsewhere classified 01/25/2018   Acute deep vein thrombosis (DVT) of left lower extremity ([place]) 01/24/2018   Acute deep vein thrombosis (DVT) of right lower extremity ([facility]) 01/24/2018   Chronic allergic rhinitis 01/24/2018   Depression with anxiety 01/24/2018   UTI due to Klebsiella species 01/24/2018   Protein-calorie malnutrition, severe ([place]) 01/24/2018   S/P insertion of IVC (inferior vena caval) filter 01/24/2018   Reactive depression    Hypokalemia    Leukocytosis    Essential hypertension    Trauma    Tetraparesis ([facility])    Neuropathic pain    Neurogenic bowel    Neurogenic bladder    Benign essential HTN    Acute blood loss anemia    Central cord syndrome at C6 level of cervical spinal cord ([place]) 11/29/2017   Central cord syndrome ([place]) 11/29/2017    Lewis Moccasin, PT 12/25/2020, 4:08 PM  [place] MAIN [facility] SERVICES [address] [place], [place], [address] Phone: [phone]   Fax:  [phone]  Name: Sherry [name] MRN: [id number] Date of Birth: [birth year]

## 2020-12-29 ENCOUNTER — Ambulatory Visit: Payer: Medicare HMO | Admitting: Occupational Therapy

## 2020-12-29 ENCOUNTER — Ambulatory Visit: Payer: Medicare HMO

## 2020-12-29 ENCOUNTER — Other Ambulatory Visit: Payer: Self-pay

## 2020-12-29 DIAGNOSIS — R2681 Unsteadiness on feet: Secondary | ICD-10-CM

## 2020-12-29 DIAGNOSIS — R262 Difficulty in walking, not elsewhere classified: Secondary | ICD-10-CM

## 2020-12-29 DIAGNOSIS — R269 Unspecified abnormalities of gait and mobility: Secondary | ICD-10-CM

## 2020-12-29 DIAGNOSIS — M6281 Muscle weakness (generalized): Secondary | ICD-10-CM

## 2020-12-29 DIAGNOSIS — R278 Other lack of coordination: Secondary | ICD-10-CM

## 2020-12-29 NOTE — Therapy (Signed)
[place] MAIN [facility] SERVICES [address] [place], [place], [address] Phone: [phone]   Fax:  [phone]  Physical Therapy Treatment  Patient Details  Name: Sherry [name] MRN: [id number] Date of Birth: 1935/01/02 No data recorded  Encounter Date: 12/29/2020   PT End of Session - 12/29/20 1412     Visit Number 113    Number of Visits 136    Date for PT Re-Evaluation 03/16/21    Authorization Time Period Recert 03/09/3333- 4/56/2563; PN on 09/09/3732; Recert 2/87/6811- 57/26/2035; PN on 5/97/4163; Recert 84/53/6468- 0/32/1224    PT Start Time 1147    PT Stop Time 1220    PT Time Calculation (min) 33 min    Equipment Utilized During Treatment Gait belt    Activity Tolerance Patient tolerated treatment well;Patient limited by fatigue;Patient limited by pain    Behavior During Therapy [facility] for tasks assessed/performed             Past Medical History:  Diagnosis Date   Acute blood loss anemia    Arthritis    Cancer ([place])    skin   Central cord syndrome at C6 level of cervical spinal cord ([place]) 11/29/2017   Hypertension    Protein-calorie malnutrition, severe ([place]) 01/24/2018   S/P insertion of IVC (inferior vena caval) filter 01/24/2018   Tetraparesis ([place])     Past Surgical History:  Procedure Laterality Date   ANTERIOR CERVICAL DECOMP/DISCECTOMY FUSION N/A 11/29/2017   Procedure: Cervical five-six, six-seven Anterior Cervical Decompression Fusion;  Surgeon: Judith Part, MD;  Location: [place];  Service: Neurosurgery;  Laterality: N/A;  Cervical five-six, six-seven Anterior Cervical Decompression Fusion   CATARACT EXTRACTION     IR IVC FILTER PLMT / S&I /IMG GUID/MOD SED  12/08/2017    There were no vitals filed for this visit.   Subjective Assessment - 12/29/20 1410     Subjective Patient reports doing well again today- States she can feel her leg well and would be able to tell if she had any pain with standing. Ok  with waiting until next week to try.    Patient is accompained by: --   [name] (aide)   Pertinent History Pt is an 86 yo female that fell in 2019, fractured vertebrae in her neck and in her low back per family. 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 After hospital stay, pt discharged to [facility] for ~1 month, experienced severe UTI as well as bilateral DVT (IVC filter placed). Discharged to [facility], stayed for about a year, and then moved to [facility] in April 2021. Was receiving PT, but per family facility reported that did not have adequate equipment to maximize PT for her. Pt until about 1 month ago was practicing sit to stand transfers with 1-2 people, and working on static standing. Has a brace for L foot due to PF. Pt currently needs assistance with all ADLs (able to assist with donning/doffing her shirt), bed baths, and needs a hoyer lift for transfers. Able to drive power wheelchair without assistance.    Limitations Standing;Walking;House hold activities;Lifting    How long can you sit comfortably? NA    How long can you stand comfortably? unable    How long can you walk comfortably? unable    Patient Stated Goals "to stand up and walk"    Currently in Pain? No/denies              INTERVENTIONS:   Patient Performed  sit to stand to see if any pain or difficulty standing- Patient able to stand with moderate assist today - Patient denied any pain with standing multiple times.   In // Bars:   Static standing in bars x 5 trials today- each over 60 sec focusing on anterior weight shifting and decreasing hyperextension on left LE. Patient was able to stand today without report of pain- No unsteadiness or instability with left LE. Patient reports relieved to be standing again and no pain.   Patient fatigued after standing sessions today so terminated session early per patient request.  Clinical Impression: Patient requested to try to stand and see if she could stand  today. Patient was able to stand with Moderate assist x multiple times and no report of pain and patient able to stand well with no instability in left ankle or pain. Did not push standing time but patient performed well with return to standing after 6 weeks. Pt will benefit from skilled physical therapy to increase mobility and strength to improve patient's quality of life                          PT Education - 12/29/20 1411     Education Details Postural education with standing.    Person(s) Educated Patient    Methods Explanation;Demonstration;Tactile cues;Verbal cues    Comprehension Verbalized understanding;Returned demonstration;Verbal cues required;Need further instruction;Tactile cues required              PT Short Term Goals - 03/10/20 2211       PT SHORT TERM GOAL #1   Title The patient will perform initial HEP with minimum assistance in order to improve strength and function.    Baseline Patient demonstrating independence with initial HEP as of 03/10/2020 with no questions or difficulty.    Time 4    Period Weeks    Status Achieved    Target Date 02/11/20               PT Long Term Goals - 12/22/20 1155       PT LONG TERM GOAL #1   Title The patient will be compliant with finalized HEP with minimum assistance in preparation for self management and maintenance of condition.    Baseline 03/10/2020- Patient familiar with initial HEP and will keep goal active as HEP becomes more progressive including possible transfers and standing activities. 04/07/2020- Patient continues to report compliance with LE strengthening home exercises without questions or concerns. 04/21/2020- Patient able to verbalize and demonstrate good understanding of current HEP including some supine and seated LE strengthening exercises. Reviewed and patient performed 10 reps today with minimal cueing. Will keep goal active to progress HEP as appropriate. 05/28/2020- patient reports  continues to perform LE strengthening as able and some help from caregiver as able. No changes at this time to home program as patient is limited to supine/seated therex. 07/02/2020- Patient continues to report compliance with current supine and seated LE strengtheing home program and states no questions or concerns regarding her plan.    Time 12    Period Weeks    Status Achieved      PT LONG TERM GOAL #2   Title The patient will demonstrate at least 10 point improvement on FOTO score indicating an improved ability to perform functional activities.    Baseline on eval (/720) score 12; 10/22/19: 12; 12/10/19: 18, 12/13: 18: 04/07/2020- Will assess next visit. 07/02/2020- Will obtain next  visit. 09/24/2020= Will obtain next visit. 11/12/2020= 12; 12/22/2020= 12    Time 12    Period Weeks    Status On-going    Target Date 03/16/21      PT LONG TERM GOAL #3   Title The patient will demonstrate lateral scooting  for assistance with transfers with minA to maximize independence.    Baseline 10/22/19: Pt requires modA+1 for lateral scooting; 12/10/19: modA+1, 12/13: modA-maxA, 12/27: modA-maxA, 03/10/2020- ModA-MaxA- increased verbal cues and visual demonstration. 04/07/2020- Continued Max assist to perform forward/lateral scoot. 04/21/2020- Patient was able to demo slight lateral scoot with CGA approx 12 in then fatigued requiring max assist- she is able to scoot forward by leaning without significant issues. 05/28/2020- While sitting in chair- patient able to perform forward and lateral scoot with VC/visual demo and increased time allowed- CGA today but not consistent yet- will keep goal active at this time to continue to focus on strengthening and improving technique. 07/02/2020- Patient able to demonstrate minimal ability to laterally shift weigh on mat table requiring max assist yet is able to forward scoot out to edge of mat table with min Assist. 08/20/2020 - Patient was able to demonstrate minimal lateral scooting at  edge of mat while holding feet still so they did not slip on tile surface. She continues to be able to demonstrate scoot forward and uses tilt option to recline to position safely back into seat of power wheelchair. 09/25/2020- Patient able to sit on mat and minimally scoot laterally but requires min/mod A for efficient scooting. Will end goal at this time due to plateau in progress with scooting over extended time.    Time 12    Period Weeks    Status Not Met      PT LONG TERM GOAL #4   Title The patient will demonstrate a squat pivot transfer with minimum assistance to maximize independence and mobility.    Baseline 10/22/19: unable to safely attempt at this time; 12/10/19: unable to safely attempt at this time, 01/14/20: unable to safety attempt at this time. 03/10/2020- Patient able to perform Stand pivot transfer with Maximal assist. 04/07/2020- Patient continues to require Max assist with sit to stand- will assist SPT next visit. 04/21/2020- Patient presents with Max assist to perform Sit to stand Transfer- unable to pivot or move Left LE well without difficulty. 07/02/2020= Max assist with SPT and minimal ability to pivot. 7/202/2022-Patient able to stand with minimal assist this visit (as good as CGA last visit) and able to move right foot to pivot and performing with moderate assist today. 09/24/2020- Patient able to perform sit to stand with Min assist from lift chair. Patient has demo inconsistency and will keep goal active.  12/17/2020- Goal is currently not appropriate as patient fractured her ankle and currently NWB. 12/22/2020=Goal is currently not appropriate as patient fractured her ankle and currently NWB.    Time 12    Period Weeks    Status On-going    Target Date 03/16/21      PT LONG TERM GOAL #5   Title The patient will demonstrate sitting without UE support for 2-5 minutes at EOB to improve participation and maximize independence with ADLs.    Baseline 10/22/19: Pt able to sit  unsupported for at least 2 minutes at edge of bed. 04/21/2020- Patient continues to demo good sitting at edge of mat > 5 miin without difficulty or back support.    Time 12    Period Weeks  Status Achieved      Additional Long Term Goals   Additional Long Term Goals Yes      PT LONG TERM GOAL #6   Title Patient will demonstrate improved functional LE strength as seen by consistent ability to stand > 2 min (3 out of 4 trials) with Mod/Max A for improved LE strength with transfers.    Baseline 05/28/2020- Patient able to inconsistent stand 1-2 min right now in //bars with Mod/Max A. 07/02/2020- Patient was able to progress last 2 visit to standing using bilateral platform attachment between 1-2 min which is a progression from the parallel bars. 08/20/2020- Patient performed static standing with min- mod A using bilateral platform attachment on walker for 6 min 30 sec today with assist for trunk control and intermittent verbal cues for posture. 09/24/2020- Patient has consistently been able to stand >2 min in // bars and when using bilateral Platform walker. 12/23/2020= Will reopen this goal as patient should transition back to weightbearing and again practice standing in new cert.    Time 12    Period Weeks    Status Deferred    Target Date 03/16/21      PT LONG TERM GOAL #7   Title Patient/caregiver will demonstrate assisted transfer using [place] Lift consistently Independently for improved ability to transfer at home from lift chair to bed.    Baseline 09/24/2020- Patient requires hoyer lift (Dependent) for safe transfers.  12/17/2020- Patient currently still dependent on hoyer as she is currently non-weight bearing due to recent ankle fracture.    Time 12    Period Weeks    Status On-going    Target Date 03/16/21      PT LONG TERM GOAL #8   Title Patient will demo improved right LE quad strength as seen by ability to extend knee to 20 deg from 0 or less while performing a Long arc quad (for  improved terminal knee ext and more full ROM)  for improved ability to bearing weight with Right LE.    Baseline 12/22/2020=able to extend knee to approx 35 deg from zero during long arc quad    Time 12    Period Weeks    Status New    Target Date 03/16/20                   Plan - 12/29/20 1413     Clinical Impression Statement Patient requested to try to stand and see if she could stand today. Patient was able to stand with Moderate assist x multiple times and no report of pain and patient able to stand well with no instability in left ankle or pain. Did not push standing time but patient performed well with return to standing after 6 weeks. Pt will benefit from skilled physical therapy to increase mobility and strength to improve patient's quality of life    Personal Factors and Comorbidities Age;Time since onset of injury/illness/exacerbation;Comorbidity 3+;Fitness    Comorbidities HTN, quadriparesis, history of DVT, neurogenic bladder    Examination-Activity Limitations Bathing;Hygiene/Grooming;Squat;Bed Mobility;Lift;Bend;Stand;Engineer, manufacturing;Toileting;Self Feeding;Transfers;Continence;Sit;Dressing;Sleep;Carry    Examination-Participation Restrictions Church;Laundry;Cleaning;Medication Management;Community Activity;Meal Prep;Interpersonal Relationship    Stability/Clinical Decision Making Evolving/Moderate complexity    Rehab Potential Fair    PT Frequency 2x / week    PT Duration 12 weeks    PT Treatment/Interventions ADLs/Self Care Home Management;Electrical Stimulation;Therapeutic activities;Wheelchair mobility training;Vasopneumatic Device;Joint Manipulations;Vestibular;Passive range of motion;Patient/family education;Therapeutic exercise;DME Instruction;Biofeedback;Aquatic Therapy;Moist Heat;Gait training;Balance training;Orthotic Fit/Training;Dry needling;Energy conservation;Taping;Splinting;Neuromuscular re-education;Cryotherapy;Ultrasound;Functional mobility  training  PT Next Visit Plan Continue with progressive standing/transfer training once MD approved    PT Home Exercise Plan no changes    Consulted and Agree with Plan of Care Patient             Patient will benefit from skilled therapeutic intervention in order to improve the following deficits and impairments:  Abnormal gait, Decreased balance, Decreased endurance, Decreased mobility, Difficulty walking, Hypomobility, Impaired sensation, Decreased range of motion, Improper body mechanics, Impaired perceived functional ability, Decreased activity tolerance, Decreased knowledge of use of DME, Decreased safety awareness, Decreased strength, Impaired flexibility, Impaired UE functional use, Postural dysfunction  Visit Diagnosis: Abnormality of gait and mobility  Difficulty in walking, not elsewhere classified  Muscle weakness (generalized)  Unsteadiness on feet     Problem List Patient Active Problem List   Diagnosis Date Noted   Acute appendicitis with appendiceal abscess 05/30/2018   Hypocalcemia 02/15/2018   Dysuria 02/15/2018   Bilateral lower extremity edema 02/11/2018   Vaginal yeast infection 01/30/2018   Chest tightness 01/27/2018   Healthcare-associated pneumonia 01/25/2018   Pleural effusion, not elsewhere classified 01/25/2018   Acute deep vein thrombosis (DVT) of left lower extremity ([place]) 01/24/2018   Acute deep vein thrombosis (DVT) of right lower extremity ([place]) 01/24/2018   Chronic allergic rhinitis 01/24/2018   Depression with anxiety 01/24/2018   UTI due to Klebsiella species 01/24/2018   Protein-calorie malnutrition, severe ([place]) 01/24/2018   S/P insertion of IVC (inferior vena caval) filter 01/24/2018   Reactive depression    Hypokalemia    Leukocytosis    Essential hypertension    Trauma    Tetraparesis ([facility])    Neuropathic pain    Neurogenic bowel    Neurogenic bladder    Benign essential HTN    Acute blood loss anemia    Central cord  syndrome at C6 level of cervical spinal cord ([place]) 11/29/2017   Central cord syndrome ([place]) 11/29/2017    Lewis Moccasin, PT 12/29/2020, 2:27 PM  [place] MAIN [facility] SERVICES [address] [place], [place], [address] Phone: [phone]   Fax:  [phone]  Name: Sherry [name] MRN: [id number] Date of Birth: 1934/11/29

## 2020-12-29 NOTE — Therapy (Signed)
[place] MAIN [facility] SERVICES [address] [place], [place], [address] Phone: [phone]   Fax:  [phone]  Occupational Therapy Treatment  Patient Details  Name: Sherry [name] MRN: [id number] Date of Birth: 08-20-1934 No data recorded  Encounter Date: 12/29/2020   OT End of Session - 12/29/20 1203     Visit Number 89    Number of Visits 96    Date for OT Re-Evaluation 01/12/21    Authorization Time Period Progress report period starting 11/26/2020    OT Start Time 1100    OT Stop Time 1145    OT Time Calculation (min) 45 min    Activity Tolerance Patient tolerated treatment well    Behavior During Therapy [facility] for tasks assessed/performed             Past Medical History:  Diagnosis Date   Acute blood loss anemia    Arthritis    Cancer ([place])    skin   Central cord syndrome at C6 level of cervical spinal cord ([place]) 11/29/2017   Hypertension    Protein-calorie malnutrition, severe ([place]) 01/24/2018   S/P insertion of IVC (inferior vena caval) filter 01/24/2018   Tetraparesis ([place])     Past Surgical History:  Procedure Laterality Date   ANTERIOR CERVICAL DECOMP/DISCECTOMY FUSION N/A 11/29/2017   Procedure: Cervical five-six, six-seven Anterior Cervical Decompression Fusion;  Surgeon: Judith Part, MD;  Location: [place];  Service: Neurosurgery;  Laterality: N/A;  Cervical five-six, six-seven Anterior Cervical Decompression Fusion   CATARACT EXTRACTION     IR IVC FILTER PLMT / S&I /IMG GUID/MOD SED  12/08/2017    There were no vitals filed for this visit.   Subjective Assessment - 12/29/20 1203     Subjective  Pt. reports that she is doing well today.    Patient is accompanied by: Family member    Pertinent History Patient s/p fall November 29, 2017 resulting in diagnosis of central cord syndrome at C 6 level.  She has had therapy in multiple venues but with recent move to [facility], therapy staff recommended she  seek outpatient therapy for her needs.    Currently in Pain? No/denies            OT TREATMENT     Pt. tolerated bilateral shoulder flexion, extension, abduction, elbow flexion, extension forearm supination AAROM/AROM. PROM bilateral wrist extension, digit MP, PIP, and DIP flexion, and extension, thumb radial, and palmar abduction in conjunction with moist heat. Pt. worked on [employer], and reciprocal motion using the UBE while sitting for 8 min. with minimal resistance. Constant monitoring was provided. Pt. worked on bilateral UE reaching up, and out for narrow cones with trunk rotation while crossing midline to place them on a lower surface. Pt. worked on reaching out wide into abduction, and well as reaching up into flexion for the narrow cones.   Pt. continues to make progress overall, and is improving with reaching further with her BUEs in preparation for improving overhead ADLs, and haircare. Pt. was able to tolerate the UBE for 8 min. with the UBE elevated for increased ROM. Pt. Was able to perform reps of reaching with trunk rotation with the bilateral UE's. Pt. tolerated ROM well without reports of pain, or discomfort. Pt. continues to present with bilateral wrist flexor tightness, and digit extensor tightness. Pt. continues to benefit from working on impoving ROM in order to work towards increasing engagement of her bilateral hands during ADLs, and IADL  tasks.                       OT Education - 12/29/20 1203     Education Details BUE functioning    Person(s) Educated Patient;Caregiver(s)    Methods Explanation    Comprehension Verbalized understanding;Need further instruction                 OT Long Term Goals - 10/20/20 1302       OT LONG TERM GOAL #1   Title Patient and caregiver will demonstrate understanding of home exercise program for ROM.    Baseline Pt. continues to have a restorative aide rehab aide assist her wih exercises at [facility]. 8/8: Independently completes/directs HEP mulitple times/week    Time 12    Period Weeks    Status Partially Met    Target Date 01/12/21      OT LONG TERM GOAL #4   Title Patient will demonstrate improved composite finger flexion to hold deodorant to apply to underarms.    Baseline 10/20/2020: Pt. conitnues to make steady progress with bilateral digit flexion.Pt. conitnues to present with stiifness/tightness which is hindering pt.'s ability to formulate a full composite fist. Pt. continues to present with improving finger flexion of R hand for a partial gross grasp, but unable to grasp and hold items. pt. conitnues to present with limited digit flexion of L hand. Pt. has active left thumb abduction. 8/8: holds cup with 2 hands, continues to have limited composite finger flexion L worse than R    Time 12    Period Weeks    Status On-going    Target Date 01/12/21      OT LONG TERM GOAL #6   Title Patient will increase right UE ROM to comb the back of her hair with modified independence.    Baseline 10/20/2020: Pt. continues to progress wtith RRUE ROM, and is able to use a pic for the back of her head, however is unable to brush the back of her hair. 07/30/2020: Pt. is now able to comb the back of her hair with a pic, however continues to work on completing the task efficiently. 8/8: Achieves with great effort, has assistance for efficiancy    Time 12    Period Weeks    Status Partially Met    Target Date 01/12/21      OT LONG TERM GOAL #7   Title Pt. will independently, and efficiently send text messages on her phone.    Baseline 10/20/2020: Pt. is now able to type a text message on the phone, however not efficiently taking increased time to complete. 07/30/2020: Pt. continues to present with limited BUE strength, and [facility] skills. 8/8: independtly drafts text messages, continues to work on accuracy and efficiancy 2/2 limited [facility]    Time 12    Period Weeks    Status Partially Met    Target  Date 01/12/21      OT LONG TERM GOAL #8   Title Pt. will write, and sign her name with 100% legibility, and modified independence.    Baseline 10/20/2020: Pt. continues to make progress overall. Pt. continues to consistently fill out her daily menu, and writing for crossword puzzles. Pt. is able to maintain grasp on a wide width pen. [place] to work on Media planner. 8/8: writes daily, continues to work on improving speed    Time 12    Period Weeks    Status Partially Met  Target Date 01/12/21                   Plan - 12/29/20 1203     Clinical Impression Statement Pt. continues to make progress overall, and is improving with reaching further with her BUEs in preparation for improving overhead ADLs, and haircare. Pt. was able to tolerate the UBE for 8 min. with the UBE elevated for increased ROM. Pt. Was able to perform reps of reaching with trunk rotation with the bilateral UE's. Pt. tolerated ROM well without reports of pain, or discomfort. Pt. continues to present with bilateral wrist flexor tightness, and digit extensor tightness. Pt. continues to benefit from working on impoving ROM in order to work towards increasing engagement of her bilateral hands during ADLs, and IADL tasks.    OT Occupational Profile and History Detailed Assessment- Review of Records and additional review of physical, cognitive, psychosocial history related to current functional performance    Occupational performance deficits (Please refer to evaluation for details): ADL's;IADL's;Leisure;Social Participation    Body Structure / Function / Physical Skills ADL;Continence;Dexterity;Flexibility;Strength;ROM;Balance;Coordination;FMC;IADL;Endurance;UE functional use;Decreased knowledge of use of DME;GMC    Psychosocial Skills Coping Strategies    Rehab Potential Fair    Clinical Decision Making Several treatment options, min-mod task modification necessary    Comorbidities Affecting Occupational  Performance: Presence of comorbidities impacting occupational performance    Comorbidities impacting occupational performance description: contractures of bilateral hands, dependent transfers,    Modification or Assistance to Complete Evaluation  No modification of tasks or assist necessary to complete eval    OT Frequency 2x / week    OT Duration 12 weeks    OT Treatment/Interventions Self-care/ADL training;Cryotherapy;Therapeutic exercise;DME and/or AE instruction;Balance training;Neuromuscular education;Manual Therapy;Splinting;Moist Heat;Passive range of motion;Therapeutic activities;Patient/family education    Consulted and Agree with Plan of Care Patient             Patient will benefit from skilled therapeutic intervention in order to improve the following deficits and impairments:   Body Structure / Function / Physical Skills: ADL, Continence, Dexterity, Flexibility, Strength, ROM, Balance, Coordination, FMC, IADL, Endurance, UE functional use, Decreased knowledge of use of DME, GMC   Psychosocial Skills: Coping Strategies   Visit Diagnosis: Muscle weakness (generalized)  Other lack of coordination    Problem List Patient Active Problem List   Diagnosis Date Noted   Acute appendicitis with appendiceal abscess 05/30/2018   Hypocalcemia 02/15/2018   Dysuria 02/15/2018   Bilateral lower extremity edema 02/11/2018   Vaginal yeast infection 01/30/2018   Chest tightness 01/27/2018   Healthcare-associated pneumonia 01/25/2018   Pleural effusion, not elsewhere classified 01/25/2018   Acute deep vein thrombosis (DVT) of left lower extremity ([place]) 01/24/2018   Acute deep vein thrombosis (DVT) of right lower extremity ([place]) 01/24/2018   Chronic allergic rhinitis 01/24/2018   Depression with anxiety 01/24/2018   UTI due to Klebsiella species 01/24/2018   Protein-calorie malnutrition, severe ([place]) 01/24/2018   S/P insertion of IVC (inferior vena caval) filter 01/24/2018    Reactive depression    Hypokalemia    Leukocytosis    Essential hypertension    Trauma    Tetraparesis (H[facility])    Neuropathic pain    Neurogenic bowel    Neurogenic bladder    Benign essential HTN    Acute blood loss anemia    Central cord syndrome at C6 level of cervical spinal cord ([place]) 11/29/2017   Central cord syndrome ([place]) 11/29/2017    Sherry Carina, MS, OTR/L 12/29/2020, 12:05  PM  [place] MAIN [facility] SERVICES [address] [place], [place], [address] Phone: [phone]   Fax:  [phone]  Name: Sherry [name] MRN: [id number] Date of Birth: 02-26-1934

## 2020-12-31 ENCOUNTER — Encounter: Payer: Self-pay | Admitting: Occupational Therapy

## 2020-12-31 ENCOUNTER — Ambulatory Visit: Payer: Medicare HMO | Admitting: Occupational Therapy

## 2020-12-31 ENCOUNTER — Ambulatory Visit: Payer: Medicare HMO

## 2020-12-31 ENCOUNTER — Other Ambulatory Visit: Payer: Self-pay

## 2020-12-31 DIAGNOSIS — M6281 Muscle weakness (generalized): Secondary | ICD-10-CM

## 2020-12-31 DIAGNOSIS — R278 Other lack of coordination: Secondary | ICD-10-CM

## 2020-12-31 DIAGNOSIS — R262 Difficulty in walking, not elsewhere classified: Secondary | ICD-10-CM

## 2020-12-31 DIAGNOSIS — R269 Unspecified abnormalities of gait and mobility: Secondary | ICD-10-CM

## 2020-12-31 DIAGNOSIS — R2681 Unsteadiness on feet: Secondary | ICD-10-CM

## 2020-12-31 NOTE — Therapy (Signed)
[place] MAIN [facility] SERVICES [address] [place], [place], [address] Phone: [phone]   Fax:  [phone]  Occupational Therapy Progress Note  Dates of reporting period 11/26/2020   to  12/31/2020   Patient Details  Name: Sherry [name] MRN: [id number] Date of Birth: 1934-08-27 No data recorded  Encounter Date: 12/31/2020   OT End of Session - 12/31/20 1111     Visit Number 90    Number of Visits 96    Date for OT Re-Evaluation 01/12/21    Authorization Time Period Progress report period starting 11/26/2020    OT Start Time 1105    OT Stop Time 1145    OT Time Calculation (min) 40 min    Equipment Utilized During Treatment moist heat    Activity Tolerance Patient tolerated treatment well    Behavior During Therapy [facility] for tasks assessed/performed             Past Medical History:  Diagnosis Date   Acute blood loss anemia    Arthritis    Cancer ([place])    skin   Central cord syndrome at C6 level of cervical spinal cord ([place]) 11/29/2017   Hypertension    Protein-calorie malnutrition, severe ([place]) 01/24/2018   S/P insertion of IVC (inferior vena caval) filter 01/24/2018   Tetraparesis ([place])     Past Surgical History:  Procedure Laterality Date   ANTERIOR CERVICAL DECOMP/DISCECTOMY FUSION N/A 11/29/2017   Procedure: Cervical five-six, six-seven Anterior Cervical Decompression Fusion;  Surgeon: Judith Part, MD;  Location: [place];  Service: Neurosurgery;  Laterality: N/A;  Cervical five-six, six-seven Anterior Cervical Decompression Fusion   CATARACT EXTRACTION     IR IVC FILTER PLMT / S&I /IMG GUID/MOD SED  12/08/2017    There were no vitals filed for this visit.   Subjective Assessment - 12/31/20 1258     Patient is accompanied by: Family member    Pertinent History Patient s/p fall November 29, 2017 resulting in diagnosis of central cord syndrome at C 6 level.  She has had therapy in multiple venues but with  recent move to [facility], therapy staff recommended she seek outpatient therapy for her needs.    Currently in Pain? No/denies             OT TREATMENT     Pt. tolerated bilateral shoulder flexion, extension, abduction, elbow flexion, extension forearm supination AAROM/AROM. PROM bilateral wrist extension, digit MP, PIP, and DIP flexion, and extension, thumb radial, and palmar abduction in conjunction with moist heat. Pt. worked on [employer], and reciprocal motion using the UBE while sitting for 8 min. with minimal resistance.   Pt. Is making progress towrds her established goals, and is improving with reaching further with her BUEs in preparation for improving overhead ADLs, and haircare. Pt. was able to tolerate the UBE for 6 min. with the UBE elevated for increased ROM. Pt. Is able to reach further to assist with performing haircare, and continues to use her hands for slef-feeding, holding utensils, and writing. Pt. tolerated ROM well without reports of pain, or discomfort. Pt. continues to present with bilateral wrist flexor tightness, and digit extensor tightness. Pt. continues to benefit from working on impoving ROM in order to work towards increasing engagement of her bilateral hands during ADLs, and IADL tasks.                      OT Education - 12/31/20 1111  Education Details BUE functioning    Person(s) Educated Patient;Caregiver(s)    Methods Explanation    Comprehension Verbalized understanding;Need further instruction                 OT Long Term Goals - 12/31/20 1112       OT LONG TERM GOAL #1   Title Patient and caregiver will demonstrate understanding of home exercise program for ROM.    Baseline Pt.'s current restorative aide is retiring at the end of December. Pt. continues to have a restorative aide rehab aide assist her wih exercises at [facility]. 8/8: Independently completes/directs HEP mulitple times/week    Time 12     Period Weeks    Target Date 03/15/20      OT LONG TERM GOAL #4   Title Patient will demonstrate improved composite finger flexion to hold deodorant to apply to underarms.    Baseline 12/31/2020:  Pt. is progressing, and is able to reach to grasp, and hold narrow cones. 10/20/2020: Pt. conitnues to make steady progress with bilateral digit flexion.Pt. continues to present with stiifness/tightness which is hindering pt.'s ability to formulate a full composite fist. Pt. continues to present with improving finger flexion of R hand for a partial gross grasp, but unable to grasp and hold items. pt. conitnues to present with limited digit flexion of L hand. Pt. has active left thumb abduction. 8/8: holds cup with 2 hands, continues to have limited composite finger flexion L worse than R.    Time 12    Period Weeks    Status On-going    Target Date 03/15/20      OT LONG TERM GOAL #6   Title Patient will increase right UE ROM to comb the back of her hair with modified independence.    Baseline 12/31/2020: Pt. is able to comb her hair. 10/20/2020: Pt. continues to progress wtith RRUE ROM, and is able to use a pic for the back of her head, however is unable to brush the back of her hair. 07/30/2020: Pt. is now able to comb the back of her hair with a pic, however continues to work on completing the task efficiently. 8/8: Achieves with great effort, has assistance for efficiancy    Time 12    Period Weeks    Status Partially Met    Target Date 01/12/21      OT LONG TERM GOAL #7   Title Pt. will independently, and efficiently send text messages on her phone.    Baseline 12/31/2020: pt. is now able to type text messages on her phone. 10/20/2020: Pt. is now able to type a text message on the phone, however not efficiently taking increased time to complete. 07/30/2020: Pt. continues to present with limited BUE strength, and [facility] skills. 8/8: independtly drafts text messages, continues to work on accuracy and  efficiancy 2/2 limited [facility]    Time 12    Period Weeks    Status Partially Met    Target Date 01/12/21      OT LONG TERM GOAL #8   Title Pt. will write, and sign her name with 100% legibility, and modified independence.    Baseline 12/31/2020:Pt. continnues to fill out her daily menu and complete puzzles. 10/20/2020: Pt. continues to make progress overall. Pt. continues to consistently fill out her daily menu, and writing for crossword puzzles. Pt. is able to maintain grasp on a wide width pen. [place] to work on Media planner. 8/8: writes daily, continues to  work on improving speed    Time 12    Status Partially Met    Target Date 01/12/21                   Plan - 12/31/20 1112     Clinical Impression Statement Pt. Is making progress towrds her established goals, and is improving with reaching further with her BUEs in preparation for improving overhead ADLs, and haircare. Pt. was able to tolerate the UBE for 6 min. with the UBE elevated for increased ROM. Pt. Is able to reach further to assist with performing haircare, and continues to use her hands for slef-feeding, holding utensils, and writing. Pt. tolerated ROM well without reports of pain, or discomfort. Pt. continues to present with bilateral wrist flexor tightness, and digit extensor tightness. Pt. continues to benefit from working on impoving ROM in order to work towards increasing engagement of her bilateral hands during ADLs, and IADL tasks.       OT Occupational Profile and History Detailed Assessment- Review of Records and additional review of physical, cognitive, psychosocial history related to current functional performance    Occupational performance deficits (Please refer to evaluation for details): ADL's;IADL's;Leisure;Social Participation    Body Structure / Function / Physical Skills ADL;Continence;Dexterity;Flexibility;Strength;ROM;Balance;Coordination;FMC;IADL;Endurance;UE functional use;Decreased knowledge  of use of DME;GMC    Psychosocial Skills Coping Strategies    Rehab Potential Fair    Clinical Decision Making Several treatment options, min-mod task modification necessary    Comorbidities Affecting Occupational Performance: Presence of comorbidities impacting occupational performance    Comorbidities impacting occupational performance description: contractures of bilateral hands, dependent transfers,    Modification or Assistance to Complete Evaluation  No modification of tasks or assist necessary to complete eval    OT Frequency 2x / week    OT Duration 12 weeks    OT Treatment/Interventions Self-care/ADL training;Cryotherapy;Therapeutic exercise;DME and/or AE instruction;Balance training;Neuromuscular education;Manual Therapy;Splinting;Moist Heat;Passive range of motion;Therapeutic activities;Patient/family education    Consulted and Agree with Plan of Care Patient             Patient will benefit from skilled therapeutic intervention in order to improve the following deficits and impairments:   Body Structure / Function / Physical Skills: ADL, Continence, Dexterity, Flexibility, Strength, ROM, Balance, Coordination, FMC, IADL, Endurance, UE functional use, Decreased knowledge of use of DME, GMC   Psychosocial Skills: Coping Strategies   Visit Diagnosis: Muscle weakness (generalized)  Other lack of coordination    Problem List Patient Active Problem List   Diagnosis Date Noted   Acute appendicitis with appendiceal abscess 05/30/2018   Hypocalcemia 02/15/2018   Dysuria 02/15/2018   Bilateral lower extremity edema 02/11/2018   Vaginal yeast infection 01/30/2018   Chest tightness 01/27/2018   Healthcare-associated pneumonia 01/25/2018   Pleural effusion, not elsewhere classified 01/25/2018   Acute deep vein thrombosis (DVT) of left lower extremity ([place]) 01/24/2018   Acute deep vein thrombosis (DVT) of right lower extremity ([place]) 01/24/2018   Chronic allergic rhinitis  01/24/2018   Depression with anxiety 01/24/2018   UTI due to Klebsiella species 01/24/2018   Protein-calorie malnutrition, severe ([place]) 01/24/2018   S/P insertion of IVC (inferior vena caval) filter 01/24/2018   Reactive depression    Hypokalemia    Leukocytosis    Essential hypertension    Trauma    Tetraparesis (HCC)    Neuropathic pain    Neurogenic bowel    Neurogenic bladder    Benign essential HTN    Acute blood loss anemia  Central cord syndrome at C6 level of cervical spinal cord ([place]) 11/29/2017   Central cord syndrome [facility]) 11/29/2017    Harrel Carina, MS, OTR/L 12/31/2020, 1:01 PM  [place] MAIN [facility] SERVICES [address] [place], [place], [address] Phone: [phone]   Fax:  [phone]  Name: Sherry [name] MRN: [id number] Date of Birth: [birth year]

## 2020-12-31 NOTE — Therapy (Signed)
[place] MAIN [facility] SERVICES [address] [place], [place], [address] Phone: [phone]   Fax:  [phone]  Physical Therapy Treatment  Patient Details  Name: Sherry [name] MRN: [id number] Date of Birth: 1934-10-29 No data recorded  Encounter Date: 12/31/2020   PT End of Session - 12/31/20 1554     Visit Number 114    Number of Visits 136    Date for PT Re-Evaluation 03/16/21    Authorization Time Period Recert 04/08/9022- 0/97/3532; PN on 10/10/2424; Recert 8/34/1962- 22/97/9892; PN on 02/20/4172; Recert 09/14/4816- 5/63/1497    PT Start Time 1148    PT Stop Time 1228    PT Time Calculation (min) 40 min    Equipment Utilized During Treatment Gait belt    Activity Tolerance Patient tolerated treatment well;Patient limited by fatigue;Patient limited by pain    Behavior During Therapy [facility] for tasks assessed/performed             Past Medical History:  Diagnosis Date   Acute blood loss anemia    Arthritis    Cancer ([place])    skin   Central cord syndrome at C6 level of cervical spinal cord ([place]) 11/29/2017   Hypertension    Protein-calorie malnutrition, severe ([place]) 01/24/2018   S/P insertion of IVC (inferior vena caval) filter 01/24/2018   Tetraparesis ([place])     Past Surgical History:  Procedure Laterality Date   ANTERIOR CERVICAL DECOMP/DISCECTOMY FUSION N/A 11/29/2017   Procedure: Cervical five-six, six-seven Anterior Cervical Decompression Fusion;  Surgeon: Judith Part, MD;  Location: [place];  Service: Neurosurgery;  Laterality: N/A;  Cervical five-six, six-seven Anterior Cervical Decompression Fusion   CATARACT EXTRACTION     IR IVC FILTER PLMT / S&I /IMG GUID/MOD SED  12/08/2017    There were no vitals filed for this visit.   Subjective Assessment - 12/31/20 1154     Subjective Patient reports having some issues with her power wheelchair- and unable to use inside of // bars today so differed standing. She states  no pain in left ankle and no soreness after last visit.    Patient is accompained by: --   [name] (aide)   Pertinent History Pt is an 86 yo female that fell in 2019, fractured vertebrae in her neck and in her low back per family. 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 After hospital stay, pt discharged to [facility] for ~1 month, experienced severe UTI as well as bilateral DVT (IVC filter placed). Discharged to [facility], stayed for about a year, and then moved to [facility] in April 2021. Was receiving PT, but per family facility reported that did not have adequate equipment to maximize PT for her. Pt until about 1 month ago was practicing sit to stand transfers with 1-2 people, and working on static standing. Has a brace for L foot due to PF. Pt currently needs assistance with all ADLs (able to assist with donning/doffing her shirt), bed baths, and needs a hoyer lift for transfers. Able to drive power wheelchair without assistance.    Limitations Standing;Walking;House hold activities;Lifting    How long can you sit comfortably? NA    How long can you stand comfortably? unable    How long can you walk comfortably? unable    Patient Stated Goals "to stand up and walk"    Currently in Pain? No/denies             Interventions:   *Patient reported having  issues with her power chair and control joystick and requested not to go into parallel bars today.   Therapeutic exercises:   Seated LE strengthening:   LAQ with eccentric control 2sets of 10 reps BLE- VC to slow down AAROM hip march- 2 sets of 15 reps AAROM Right hip abd/add x 15; AROM left hip abd/add x 15 reps.   Resisted scap rows- VC for correct technique using GTB for resistance 2 sets of 15 reps  Resistive Lumbar ext using towel and GTB 2 sets of 15 reps - VC for correct technique.   Matrix cable system- Tricep with 2.5 lb. BUE x 2 sets of 12 reps- Min assist on left UE to assist with grip.   Matrix cable  system- Bicep with 2.5 lb. BUE x 2 sets of 12 reps   Education provided throughout session via VC/TC and demonstration to facilitate movement at target joints and correct muscle activation for all testing and exercises performed.    Patient performed well and able to exhibit improved ability to sustain eccentric hold of right LE quads. She was able to progress to some postural strengthening with good erect posture and later able to focus on some UE strength to assist with pulling up and pushing up. Pt will benefit from skilled physical therapy to increase mobility and strength to improve patient's quality of life                       PT Education - 12/31/20 1553     Education Details Exercise technique    Person(s) Educated Patient    Methods Explanation;Demonstration;Tactile cues;Verbal cues    Comprehension Verbalized understanding;Returned demonstration;Verbal cues required;Need further instruction;Tactile cues required              PT Short Term Goals - 03/10/20 2211       PT SHORT TERM GOAL #1   Title The patient will perform initial HEP with minimum assistance in order to improve strength and function.    Baseline Patient demonstrating independence with initial HEP as of 03/10/2020 with no questions or difficulty.    Time 4    Period Weeks    Status Achieved    Target Date 02/11/20               PT Long Term Goals - 12/22/20 1155       PT LONG TERM GOAL #1   Title The patient will be compliant with finalized HEP with minimum assistance in preparation for self management and maintenance of condition.    Baseline 03/10/2020- Patient familiar with initial HEP and will keep goal active as HEP becomes more progressive including possible transfers and standing activities. 04/07/2020- Patient continues to report compliance with LE strengthening home exercises without questions or concerns. 04/21/2020- Patient able to verbalize and demonstrate good  understanding of current HEP including some supine and seated LE strengthening exercises. Reviewed and patient performed 10 reps today with minimal cueing. Will keep goal active to progress HEP as appropriate. 05/28/2020- patient reports continues to perform LE strengthening as able and some help from caregiver as able. No changes at this time to home program as patient is limited to supine/seated therex. 07/02/2020- Patient continues to report compliance with current supine and seated LE strengtheing home program and states no questions or concerns regarding her plan.    Time 12    Period Weeks    Status Achieved      PT LONG TERM GOAL #  2   Title The patient will demonstrate at least 10 point improvement on FOTO score indicating an improved ability to perform functional activities.    Baseline on eval (/720) score 12; 10/22/19: 12; 12/10/19: 18, 12/13: 18: 04/07/2020- Will assess next visit. 07/02/2020- Will obtain next visit. 09/24/2020= Will obtain next visit. 11/12/2020= 12; 12/22/2020= 12    Time 12    Period Weeks    Status On-going    Target Date 03/16/21      PT LONG TERM GOAL #3   Title The patient will demonstrate lateral scooting  for assistance with transfers with minA to maximize independence.    Baseline 10/22/19: Pt requires modA+1 for lateral scooting; 12/10/19: modA+1, 12/13: modA-maxA, 12/27: modA-maxA, 03/10/2020- ModA-MaxA- increased verbal cues and visual demonstration. 04/07/2020- Continued Max assist to perform forward/lateral scoot. 04/21/2020- Patient was able to demo slight lateral scoot with CGA approx 12 in then fatigued requiring max assist- she is able to scoot forward by leaning without significant issues. 05/28/2020- While sitting in chair- patient able to perform forward and lateral scoot with VC/visual demo and increased time allowed- CGA today but not consistent yet- will keep goal active at this time to continue to focus on strengthening and improving technique. 07/02/2020- Patient  able to demonstrate minimal ability to laterally shift weigh on mat table requiring max assist yet is able to forward scoot out to edge of mat table with min Assist. 08/20/2020 - Patient was able to demonstrate minimal lateral scooting at edge of mat while holding feet still so they did not slip on tile surface. She continues to be able to demonstrate scoot forward and uses tilt option to recline to position safely back into seat of power wheelchair. 09/25/2020- Patient able to sit on mat and minimally scoot laterally but requires min/mod A for efficient scooting. Will end goal at this time due to plateau in progress with scooting over extended time.    Time 12    Period Weeks    Status Not Met      PT LONG TERM GOAL #4   Title The patient will demonstrate a squat pivot transfer with minimum assistance to maximize independence and mobility.    Baseline 10/22/19: unable to safely attempt at this time; 12/10/19: unable to safely attempt at this time, 01/14/20: unable to safety attempt at this time. 03/10/2020- Patient able to perform Stand pivot transfer with Maximal assist. 04/07/2020- Patient continues to require Max assist with sit to stand- will assist SPT next visit. 04/21/2020- Patient presents with Max assist to perform Sit to stand Transfer- unable to pivot or move Left LE well without difficulty. 07/02/2020= Max assist with SPT and minimal ability to pivot. 7/202/2022-Patient able to stand with minimal assist this visit (as good as CGA last visit) and able to move right foot to pivot and performing with moderate assist today. 09/24/2020- Patient able to perform sit to stand with Min assist from lift chair. Patient has demo inconsistency and will keep goal active.  12/17/2020- Goal is currently not appropriate as patient fractured her ankle and currently NWB. 12/22/2020=Goal is currently not appropriate as patient fractured her ankle and currently NWB.    Time 12    Period Weeks    Status On-going    Target  Date 03/16/21      PT LONG TERM GOAL #5   Title The patient will demonstrate sitting without UE support for 2-5 minutes at EOB to improve participation and maximize independence with ADLs.  Baseline 10/22/19: Pt able to sit unsupported for at least 2 minutes at edge of bed. 04/21/2020- Patient continues to demo good sitting at edge of mat > 5 miin without difficulty or back support.    Time 12    Period Weeks    Status Achieved      Additional Long Term Goals   Additional Long Term Goals Yes      PT LONG TERM GOAL #6   Title Patient will demonstrate improved functional LE strength as seen by consistent ability to stand > 2 min (3 out of 4 trials) with Mod/Max A for improved LE strength with transfers.    Baseline 05/28/2020- Patient able to inconsistent stand 1-2 min right now in //bars with Mod/Max A. 07/02/2020- Patient was able to progress last 2 visit to standing using bilateral platform attachment between 1-2 min which is a progression from the parallel bars. 08/20/2020- Patient performed static standing with min- mod A using bilateral platform attachment on walker for 6 min 30 sec today with assist for trunk control and intermittent verbal cues for posture. 09/24/2020- Patient has consistently been able to stand >2 min in // bars and when using bilateral Platform walker. 12/23/2020= Will reopen this goal as patient should transition back to weightbearing and again practice standing in new cert.    Time 12    Period Weeks    Status Deferred    Target Date 03/16/21      PT LONG TERM GOAL #7   Title Patient/caregiver will demonstrate assisted transfer using [place] Lift consistently Independently for improved ability to transfer at home from lift chair to bed.    Baseline 09/24/2020- Patient requires hoyer lift (Dependent) for safe transfers.  12/17/2020- Patient currently still dependent on hoyer as she is currently non-weight bearing due to recent ankle fracture.    Time 12    Period Weeks     Status On-going    Target Date 03/16/21      PT LONG TERM GOAL #8   Title Patient will demo improved right LE quad strength as seen by ability to extend knee to 20 deg from 0 or less while performing a Long arc quad (for improved terminal knee ext and more full ROM)  for improved ability to bearing weight with Right LE.    Baseline 12/22/2020=able to extend knee to approx 35 deg from zero during long arc quad    Time 12    Period Weeks    Status New    Target Date 03/16/20                   Plan - 12/31/20 1554     Clinical Impression Statement Patient performed well and able to exhibit improved ability to sustain eccentric hold of right LE quads. She was able to progress to some postural strengthening with good erect posture and later able to focus on some UE strength to assist with pulling up and pushing up. Pt will benefit from skilled physical therapy to increase mobility and strength to improve patient's quality of life    Personal Factors and Comorbidities Age;Time since onset of injury/illness/exacerbation;Comorbidity 3+;Fitness    Comorbidities HTN, quadriparesis, history of DVT, neurogenic bladder    Examination-Activity Limitations Bathing;Hygiene/Grooming;Squat;Bed Mobility;Lift;Bend;Stand;Engineer, manufacturing;Toileting;Self Feeding;Transfers;Continence;Sit;Dressing;Sleep;Carry    Examination-Participation Restrictions Church;Laundry;Cleaning;Medication Management;Community Activity;Meal Prep;Interpersonal Relationship    Stability/Clinical Decision Making Evolving/Moderate complexity    Rehab Potential Fair    PT Frequency 2x / week    PT Duration 12  weeks    PT Treatment/Interventions ADLs/Self Care Home Management;Electrical Stimulation;Therapeutic activities;Wheelchair mobility training;Vasopneumatic Device;Joint Manipulations;Vestibular;Passive range of motion;Patient/family education;Therapeutic exercise;DME Instruction;Biofeedback;Aquatic Therapy;Moist  Heat;Gait training;Balance training;Orthotic Fit/Training;Dry needling;Energy conservation;Taping;Splinting;Neuromuscular re-education;Cryotherapy;Ultrasound;Functional mobility training    PT Next Visit Plan Continue with progressive standing/transfer training once MD approved    PT Home Exercise Plan no changes    Consulted and Agree with Plan of Care Patient             Patient will benefit from skilled therapeutic intervention in order to improve the following deficits and impairments:  Abnormal gait, Decreased balance, Decreased endurance, Decreased mobility, Difficulty walking, Hypomobility, Impaired sensation, Decreased range of motion, Improper body mechanics, Impaired perceived functional ability, Decreased activity tolerance, Decreased knowledge of use of DME, Decreased safety awareness, Decreased strength, Impaired flexibility, Impaired UE functional use, Postural dysfunction  Visit Diagnosis: Abnormality of gait and mobility  Difficulty in walking, not elsewhere classified  Muscle weakness (generalized)  Unsteadiness on feet     Problem List Patient Active Problem List   Diagnosis Date Noted   Acute appendicitis with appendiceal abscess 05/30/2018   Hypocalcemia 02/15/2018   Dysuria 02/15/2018   Bilateral lower extremity edema 02/11/2018   Vaginal yeast infection 01/30/2018   Chest tightness 01/27/2018   Healthcare-associated pneumonia 01/25/2018   Pleural effusion, not elsewhere classified 01/25/2018   Acute deep vein thrombosis (DVT) of left lower extremity ([place]) 01/24/2018   Acute deep vein thrombosis (DVT) of right lower extremity ([facility]) 01/24/2018   Chronic allergic rhinitis 01/24/2018   Depression with anxiety 01/24/2018   UTI due to Klebsiella species 01/24/2018   Protein-calorie malnutrition, severe ([place]) 01/24/2018   S/P insertion of IVC (inferior vena caval) filter 01/24/2018   Reactive depression    Hypokalemia    Leukocytosis    Essential  hypertension    Trauma    Tetraparesis ([facility])    Neuropathic pain    Neurogenic bowel    Neurogenic bladder    Benign essential HTN    Acute blood loss anemia    Central cord syndrome at C6 level of cervical spinal cord ([place]) 11/29/2017   Central cord syndrome ([place]) 11/29/2017    Lewis Moccasin, PT 12/31/2020, 4:21 PM  [place] MAIN [facility] SERVICES [address] [place], [place], [address] Phone: [phone]   Fax:  [phone]  Name: Sherry [name] MRN: [id number] Date of Birth: 13-Jun-1934

## 2021-01-05 ENCOUNTER — Encounter: Payer: Self-pay | Admitting: Occupational Therapy

## 2021-01-05 ENCOUNTER — Ambulatory Visit: Payer: Medicare HMO | Attending: Internal Medicine | Admitting: Occupational Therapy

## 2021-01-05 ENCOUNTER — Ambulatory Visit: Payer: Medicare HMO

## 2021-01-05 ENCOUNTER — Other Ambulatory Visit: Payer: Self-pay

## 2021-01-05 DIAGNOSIS — S14129S Central cord syndrome at unspecified level of cervical spinal cord, sequela: Secondary | ICD-10-CM | POA: Insufficient documentation

## 2021-01-05 DIAGNOSIS — R269 Unspecified abnormalities of gait and mobility: Secondary | ICD-10-CM

## 2021-01-05 DIAGNOSIS — M6281 Muscle weakness (generalized): Secondary | ICD-10-CM | POA: Diagnosis present

## 2021-01-05 DIAGNOSIS — R140 Abdominal distension (gaseous): Secondary | ICD-10-CM | POA: Insufficient documentation

## 2021-01-05 DIAGNOSIS — R262 Difficulty in walking, not elsewhere classified: Secondary | ICD-10-CM | POA: Diagnosis present

## 2021-01-05 DIAGNOSIS — R278 Other lack of coordination: Secondary | ICD-10-CM | POA: Insufficient documentation

## 2021-01-05 DIAGNOSIS — R2681 Unsteadiness on feet: Secondary | ICD-10-CM

## 2021-01-05 DIAGNOSIS — R2689 Other abnormalities of gait and mobility: Secondary | ICD-10-CM | POA: Insufficient documentation

## 2021-01-05 NOTE — Therapy (Signed)
[place] MAIN [facility] SERVICES [address] [place], [place], [address] Phone: [phone]   Fax:  [phone]  Occupational Therapy Treatment  Patient Details  Name: AIREONNA BAUER MRN: [id number] Date of Birth: 1934/10/28 No data recorded  Encounter Date: 01/05/2021   OT End of Session - 01/05/21 1156     Visit Number 91    Number of Visits 96    Date for OT Re-Evaluation 01/12/21    Authorization Time Period Progress report period starting 11/26/2020    OT Start Time 1100    OT Stop Time 1143    OT Time Calculation (min) 43 min    Activity Tolerance Patient tolerated treatment well    Behavior During Therapy [facility] for tasks assessed/performed             Past Medical History:  Diagnosis Date   Acute blood loss anemia    Arthritis    Cancer ([place])    skin   Central cord syndrome at C6 level of cervical spinal cord ([place]) 11/29/2017   Hypertension    Protein-calorie malnutrition, severe ([place]) 01/24/2018   S/P insertion of IVC (inferior vena caval) filter 01/24/2018   Tetraparesis ([place])     Past Surgical History:  Procedure Laterality Date   ANTERIOR CERVICAL DECOMP/DISCECTOMY FUSION N/A 11/29/2017   Procedure: Cervical five-six, six-seven Anterior Cervical Decompression Fusion;  Surgeon: Judith Part, MD;  Location: [place];  Service: Neurosurgery;  Laterality: N/A;  Cervical five-six, six-seven Anterior Cervical Decompression Fusion   CATARACT EXTRACTION     IR IVC FILTER PLMT / S&I /IMG GUID/MOD SED  12/08/2017    There were no vitals filed for this visit.   Subjective Assessment - 01/05/21 1156     Subjective  Pt. reports that she is doing well today.    Patient is accompanied by: Family member    Pertinent History Patient s/p fall November 29, 2017 resulting in diagnosis of central cord syndrome at C 6 level.  She has had therapy in multiple venues but with recent move to [facility], therapy staff recommended she  seek outpatient therapy for her needs.    Patient Stated Goals Patient would like to be able to move better in bed, stand and perform self care tasks.    Currently in Pain? No/denies            OT TREATMENT     Pt. tolerated bilateral shoulder flexion, extension, abduction, elbow flexion, extension forearm supination AAROM/AROM. PROM bilateral wrist extension, digit MP, PIP, and DIP flexion, and extension, thumb radial, and palmar abduction in conjunction with moist heat. Pt. worked on [employer], and reciprocal motion using the UBE while sitting for 8 min. with minimal resistance.   Pt. continues to make progress, and is improving with reaching further with her BUEs in preparation for improving overhead ADLs, and haircare. Pt. was able to tolerate the UBE for 8 min. with the UBE elevated for increased ROM. Pt. continues to be able to reach further to assist with performing haircare, and continues to use her hands for self-feeding, holding utensils, and writing. Pt. tolerated ROM well without reports of pain, or discomfort. Pt. continues to present with bilateral wrist flexor tightness, and digit extensor tightness. Pt. continues to benefit from working on impoving ROM in order to work towards increasing engagement of her bilateral hands during ADLs, and IADL tasks.  OT Education - 01/05/21 1156     Education Details BUE functioning    Person(s) Educated Patient;Caregiver(s)    Methods Explanation    Comprehension Verbalized understanding;Need further instruction                 OT Long Term Goals - 12/31/20 1112       OT LONG TERM GOAL #1   Title Patient and caregiver will demonstrate understanding of home exercise program for ROM.    Baseline Pt.'s current restorative aide is retiring at the end of December. Pt. continues to have a restorative aide rehab aide assist her wih exercises at [facility]. 8/8: Independently  completes/directs HEP mulitple times/week    Time 12    Period Weeks    Target Date 03/15/20      OT LONG TERM GOAL #4   Title Patient will demonstrate improved composite finger flexion to hold deodorant to apply to underarms.    Baseline 12/31/2020:  Pt. is progressing, and is able to reach to grasp, and hold narrow cones. 10/20/2020: Pt. conitnues to make steady progress with bilateral digit flexion.Pt. continues to present with stiifness/tightness which is hindering pt.'s ability to formulate a full composite fist. Pt. continues to present with improving finger flexion of R hand for a partial gross grasp, but unable to grasp and hold items. pt. conitnues to present with limited digit flexion of L hand. Pt. has active left thumb abduction. 8/8: holds cup with 2 hands, continues to have limited composite finger flexion L worse than R.    Time 12    Period Weeks    Status On-going    Target Date 03/15/20      OT LONG TERM GOAL #6   Title Patient will increase right UE ROM to comb the back of her hair with modified independence.    Baseline 12/31/2020: Pt. is able to comb her hair. 10/20/2020: Pt. continues to progress wtith RRUE ROM, and is able to use a pic for the back of her head, however is unable to brush the back of her hair. 07/30/2020: Pt. is now able to comb the back of her hair with a pic, however continues to work on completing the task efficiently. 8/8: Achieves with great effort, has assistance for efficiancy    Time 12    Period Weeks    Status Partially Met    Target Date 01/12/21      OT LONG TERM GOAL #7   Title Pt. will independently, and efficiently send text messages on her phone.    Baseline 12/31/2020: pt. is now able to type text messages on her phone. 10/20/2020: Pt. is now able to type a text message on the phone, however not efficiently taking increased time to complete. 07/30/2020: Pt. continues to present with limited BUE strength, and [facility] skills. 8/8: independtly drafts  text messages, continues to work on accuracy and efficiancy 2/2 limited [facility]    Time 12    Period Weeks    Status Partially Met    Target Date 01/12/21      OT LONG TERM GOAL #8   Title Pt. will write, and sign her name with 100% legibility, and modified independence.    Baseline 12/31/2020:Pt. continnues to fill out her daily menu and complete puzzles. 10/20/2020: Pt. continues to make progress overall. Pt. continues to consistently fill out her daily menu, and writing for crossword puzzles. Pt. is able to maintain grasp on a wide width pen. [place] to  work on [employer]. 8/8: writes daily, continues to work on improving speed    Time 12    Status Partially Met    Target Date 01/12/21                   Plan - 01/05/21 1157     Clinical Impression Statement Pt. continues to make progress, and is improving with reaching further with her BUEs in preparation for improving overhead ADLs, and haircare. Pt. was able to tolerate the UBE for 8 min. with the UBE elevated for increased ROM. Pt. continues to be able to reach further to assist with performing haircare, and continues to use her hands for self-feeding, holding utensils, and writing. Pt. tolerated ROM well without reports of pain, or discomfort. Pt. continues to present with bilateral wrist flexor tightness, and digit extensor tightness. Pt. continues to benefit from working on impoving ROM in order to work towards increasing engagement of her bilateral hands during ADLs, and IADL tasks.    OT Occupational Profile and History Detailed Assessment- Review of Records and additional review of physical, cognitive, psychosocial history related to current functional performance    Occupational performance deficits (Please refer to evaluation for details): ADL's;IADL's;Leisure;Social Participation    Body Structure / Function / Physical Skills ADL;Continence;Dexterity;Flexibility;Strength;ROM;Balance;Coordination;FMC;IADL;Endurance;UE  functional use;Decreased knowledge of use of DME;GMC    Psychosocial Skills Coping Strategies    Rehab Potential Fair    Clinical Decision Making Several treatment options, min-mod task modification necessary    Comorbidities Affecting Occupational Performance: Presence of comorbidities impacting occupational performance    Comorbidities impacting occupational performance description: contractures of bilateral hands, dependent transfers,    Modification or Assistance to Complete Evaluation  No modification of tasks or assist necessary to complete eval    OT Frequency 2x / week    OT Duration 12 weeks    OT Treatment/Interventions Self-care/ADL training;Cryotherapy;Therapeutic exercise;DME and/or AE instruction;Balance training;Neuromuscular education;Manual Therapy;Splinting;Moist Heat;Passive range of motion;Therapeutic activities;Patient/family education    Consulted and Agree with Plan of Care Patient             Patient will benefit from skilled therapeutic intervention in order to improve the following deficits and impairments:   Body Structure / Function / Physical Skills: ADL, Continence, Dexterity, Flexibility, Strength, ROM, Balance, Coordination, FMC, IADL, Endurance, UE functional use, Decreased knowledge of use of DME, GMC   Psychosocial Skills: Coping Strategies   Visit Diagnosis: Muscle weakness (generalized)  Other lack of coordination    Problem List Patient Active Problem List   Diagnosis Date Noted   Acute appendicitis with appendiceal abscess 05/30/2018   Hypocalcemia 02/15/2018   Dysuria 02/15/2018   Bilateral lower extremity edema 02/11/2018   Vaginal yeast infection 01/30/2018   Chest tightness 01/27/2018   Healthcare-associated pneumonia 01/25/2018   Pleural effusion, not elsewhere classified 01/25/2018   Acute deep vein thrombosis (DVT) of left lower extremity ([place]) 01/24/2018   Acute deep vein thrombosis (DVT) of right lower extremity ([place])  01/24/2018   Chronic allergic rhinitis 01/24/2018   Depression with anxiety 01/24/2018   UTI due to Klebsiella species 01/24/2018   Protein-calorie malnutrition, severe ([place]) 01/24/2018   S/P insertion of IVC (inferior vena caval) filter 01/24/2018   Reactive depression    Hypokalemia    Leukocytosis    Essential hypertension    Trauma    Tetraparesis ([facility])    Neuropathic pain    Neurogenic bowel    Neurogenic bladder    Benign essential HTN  Acute blood loss anemia    Central cord syndrome at C6 level of cervical spinal cord ([facility]) 11/29/2017   Central cord syndrome [facility]) 11/29/2017    Harrel Carina, MS, OTR/L 01/05/2021, 11:59 AM  [place] MAIN [facility] SERVICES [place], [place], [address] Phone: [phone]   Fax:  [phone]  Name: SANI LOISEAU MRN: [id number] Date of Birth: 11/21/1934

## 2021-01-05 NOTE — Therapy (Signed)
[place] [facility] SERVICES [address] [place], [place], [address] Phone: [phone]   Fax:  [phone]  Physical Therapy Treatment  Patient Details  Name: Sherry [name] MRN: [id number] Date of Birth: 1934-09-24 No data recorded  Encounter Date: 01/05/2021   PT End of Session - 01/05/21 1303     Visit Number 115    Number of Visits 136    Date for PT Re-Evaluation 03/16/21    Authorization Time Period Recert 0/03/5850- 7/78/2423; PN on 06/04/6142; Recert 04/16/4006- 67/61/9509; PN on 04/27/7122; Recert 58/10/9831- 09/25/537    PT Start Time 1145    PT Stop Time 1225    PT Time Calculation (min) 40 min    Equipment Utilized During Treatment Gait belt    Activity Tolerance Patient tolerated treatment well;Patient limited by fatigue;Patient limited by pain    Behavior During Therapy [facility] for tasks assessed/performed             Past Medical History:  Diagnosis Date   Acute blood loss anemia    Arthritis    Cancer ([place])    skin   Central cord syndrome at C6 level of cervical spinal cord ([place]) 11/29/2017   Hypertension    Protein-calorie malnutrition, severe ([place]) 01/24/2018   S/P insertion of IVC (inferior vena caval) filter 01/24/2018   Tetraparesis ([place])     Past Surgical History:  Procedure Laterality Date   ANTERIOR CERVICAL DECOMP/DISCECTOMY FUSION N/A 11/29/2017   Procedure: Cervical five-six, six-seven Anterior Cervical Decompression Fusion;  Surgeon: Judith Part, MD;  Location: [place];  Service: Neurosurgery;  Laterality: N/A;  Cervical five-six, six-seven Anterior Cervical Decompression Fusion   CATARACT EXTRACTION     IR IVC FILTER PLMT / S&I /IMG GUID/MOD SED  12/08/2017    There were no vitals filed for this visit.   Subjective Assessment - 01/05/21 1150     Patient is accompained by: [name] (aide)   Pertinent History Pt is an 86 yo female that fell in 2019, fractured vertebrae in her neck and in  her low back per family. 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 After hospital stay, pt discharged to [facility] for ~1 month, experienced severe UTI as well as bilateral DVT (IVC filter placed). Discharged to [facility], stayed for about a year, and then moved to [facility] in April 2021. Was receiving PT, but per family facility reported that did not have adequate equipment to maximize PT for her. Pt until about 1 month ago was practicing sit to stand transfers with 1-2 people, and working on static standing. Has a brace for L foot due to PF. Pt currently needs assistance with all ADLs (able to assist with donning/doffing her shirt), bed baths, and needs a hoyer lift for transfers. Able to drive power wheelchair without assistance.    Limitations Standing;Walking;House hold activities;Lifting    How long can you sit comfortably? NA    How long can you stand comfortably? unable    How long can you walk comfortably? unable    Patient Stated Goals "to stand up and walk"    Currently in Pain? No/denies                 INTERVENTIONS:  *Patient arrived without Left boot due to difficulty with velcro straps needing replacing and module control on power w/c still needs repair- Patient limited to therex only today.   Therapeutic exercises: Reclined exercises:  -B Heel slides  BLE  2 sets x 15 reps (AAROM for optimal range)   -Hip abduction/add with min physical resistance B LE 2 sets of 15 reps -SLR- BLE 2 sets of 12 reps.  -Seated abd sit up x 12 reps  -Seated exercises:  - Hip march- AROM 2 sets  x 15 reps each LE.  - Knee ext - AROM on left and AAROM on right LE. 1 set of 15 reps. 2nd set - focused on eccentric control x 12 reps - Hip add squeeze on ball with 5 sec hold x 10 reps each x 2 sets each.   Education provided throughout session via VC/TC and demonstration to facilitate movement at target joints and correct muscle activation for all testing and exercises  performed.                      PT Education - 01/05/21 1150     Education Details Exercises technique    Person(s) Educated Patient    Methods Explanation;Demonstration;Tactile cues;Verbal cues    Comprehension Verbalized understanding;Returned demonstration;Verbal cues required;Need further instruction              PT Short Term Goals - 03/10/20 2211       PT SHORT TERM GOAL #1   Title The patient will perform initial HEP with minimum assistance in order to improve strength and function.    Baseline Patient demonstrating independence with initial HEP as of 03/10/2020 with no questions or difficulty.    Time 4    Period Weeks    Status Achieved    Target Date 02/11/20               PT Long Term Goals - 12/22/20 1155       PT LONG TERM GOAL #1   Title The patient will be compliant with finalized HEP with minimum assistance in preparation for self management and maintenance of condition.    Baseline 03/10/2020- Patient familiar with initial HEP and will keep goal active as HEP becomes more progressive including possible transfers and standing activities. 04/07/2020- Patient continues to report compliance with LE strengthening home exercises without questions or concerns. 04/21/2020- Patient able to verbalize and demonstrate good understanding of current HEP including some supine and seated LE strengthening exercises. Reviewed and patient performed 10 reps today with minimal cueing. Will keep goal active to progress HEP as appropriate. 05/28/2020- patient reports continues to perform LE strengthening as able and some help from caregiver as able. No changes at this time to home program as patient is limited to supine/seated therex. 07/02/2020- Patient continues to report compliance with current supine and seated LE strengtheing home program and states no questions or concerns regarding her plan.    Time 12    Period Weeks    Status Achieved      PT LONG TERM GOAL  #2   Title The patient will demonstrate at least 10 point improvement on FOTO score indicating an improved ability to perform functional activities.    Baseline on eval (/720) score 12; 10/22/19: 12; 12/10/19: 18, 12/13: 18: 04/07/2020- Will assess next visit. 07/02/2020- Will obtain next visit. 09/24/2020= Will obtain next visit. 11/12/2020= 12; 12/22/2020= 12    Time 12    Period Weeks    Status On-going    Target Date 03/16/21      PT LONG TERM GOAL #3   Title The patient will demonstrate lateral scooting  for assistance with transfers with minA to maximize  independence.    Baseline 10/22/19: Pt requires modA+1 for lateral scooting; 12/10/19: modA+1, 12/13: modA-maxA, 12/27: modA-maxA, 03/10/2020- ModA-MaxA- increased verbal cues and visual demonstration. 04/07/2020- Continued Max assist to perform forward/lateral scoot. 04/21/2020- Patient was able to demo slight lateral scoot with CGA approx 12 in then fatigued requiring max assist- she is able to scoot forward by leaning without significant issues. 05/28/2020- While sitting in chair- patient able to perform forward and lateral scoot with VC/visual demo and increased time allowed- CGA today but not consistent yet- will keep goal active at this time to continue to focus on strengthening and improving technique. 07/02/2020- Patient able to demonstrate minimal ability to laterally shift weigh on mat table requiring max assist yet is able to forward scoot out to edge of mat table with min Assist. 08/20/2020 - Patient was able to demonstrate minimal lateral scooting at edge of mat while holding feet still so they did not slip on tile surface. She continues to be able to demonstrate scoot forward and uses tilt option to recline to position safely back into seat of power wheelchair. 09/25/2020- Patient able to sit on mat and minimally scoot laterally but requires min/mod A for efficient scooting. Will end goal at this time due to plateau in progress with scooting over  extended time.    Time 12    Period Weeks    Status Not Met      PT LONG TERM GOAL #4   Title The patient will demonstrate a squat pivot transfer with minimum assistance to maximize independence and mobility.    Baseline 10/22/19: unable to safely attempt at this time; 12/10/19: unable to safely attempt at this time, 01/14/20: unable to safety attempt at this time. 03/10/2020- Patient able to perform Stand pivot transfer with Maximal assist. 04/07/2020- Patient continues to require Max assist with sit to stand- will assist SPT next visit. 04/21/2020- Patient presents with Max assist to perform Sit to stand Transfer- unable to pivot or move Left LE well without difficulty. 07/02/2020= Max assist with SPT and minimal ability to pivot. 7/202/2022-Patient able to stand with minimal assist this visit (as good as CGA last visit) and able to move right foot to pivot and performing with moderate assist today. 09/24/2020- Patient able to perform sit to stand with Min assist from lift chair. Patient has demo inconsistency and will keep goal active.  12/17/2020- Goal is currently not appropriate as patient fractured her ankle and currently NWB. 12/22/2020=Goal is currently not appropriate as patient fractured her ankle and currently NWB.    Time 12    Period Weeks    Status On-going    Target Date 03/16/21      PT LONG TERM GOAL #5   Title The patient will demonstrate sitting without UE support for 2-5 minutes at EOB to improve participation and maximize independence with ADLs.    Baseline 10/22/19: Pt able to sit unsupported for at least 2 minutes at edge of bed. 04/21/2020- Patient continues to demo good sitting at edge of mat > 5 miin without difficulty or back support.    Time 12    Period Weeks    Status Achieved      Additional Long Term Goals   Additional Long Term Goals Yes      PT LONG TERM GOAL #6   Title Patient will demonstrate improved functional LE strength as seen by consistent ability to stand > 2  min (3 out of 4 trials) with Mod/Max A for improved  LE strength with transfers.    Baseline 05/28/2020- Patient able to inconsistent stand 1-2 min right now in //bars with Mod/Max A. 07/02/2020- Patient was able to progress last 2 visit to standing using bilateral platform attachment between 1-2 min which is a progression from the parallel bars. 08/20/2020- Patient performed static standing with min- mod A using bilateral platform attachment on walker for 6 min 30 sec today with assist for trunk control and intermittent verbal cues for posture. 09/24/2020- Patient has consistently been able to stand >2 min in // bars and when using bilateral Platform walker. 12/23/2020= Will reopen this goal as patient should transition back to weightbearing and again practice standing in new cert.    Time 12    Period Weeks    Status Deferred    Target Date 03/16/21      PT LONG TERM GOAL #7   Title Patient/caregiver will demonstrate assisted transfer using [place] Lift consistently Independently for improved ability to transfer at home from lift chair to bed.    Baseline 09/24/2020- Patient requires hoyer lift (Dependent) for safe transfers.  12/17/2020- Patient currently still dependent on hoyer as she is currently non-weight bearing due to recent ankle fracture.    Time 12    Period Weeks    Status On-going    Target Date 03/16/21      PT LONG TERM GOAL #8   Title Patient will demo improved right LE quad strength as seen by ability to extend knee to 20 deg from 0 or less while performing a Long arc quad (for improved terminal knee ext and more full ROM)  for improved ability to bearing weight with Right LE.    Baseline 12/22/2020=able to extend knee to approx 35 deg from zero during long arc quad    Time 12    Period Weeks    Status New    Target Date 03/16/20                   Plan - 01/05/21 1306     Clinical Impression Statement Patient arrived with good motivation today and performed well with  all seated therex today with minimal rest breaks and continued improved eccentric control with right quad. She denied any pain throughout session and reports only limited by fatigue. Pt will benefit from skilled physical therapy to increase mobility and strength to improve patient's quality of life    Personal Factors and Comorbidities Age;Time since onset of injury/illness/exacerbation;Comorbidity 3+;Fitness    Comorbidities HTN, quadriparesis, history of DVT, neurogenic bladder    Examination-Activity Limitations Bathing;Hygiene/Grooming;Squat;Bed Mobility;Lift;Bend;Stand;Engineer, manufacturing;Toileting;Self Feeding;Transfers;Continence;Sit;Dressing;Sleep;Carry    Examination-Participation Restrictions Church;Laundry;Cleaning;Medication Management;Community Activity;Meal Prep;Interpersonal Relationship    Stability/Clinical Decision Making Evolving/Moderate complexity    Rehab Potential Fair    PT Frequency 2x / week    PT Duration 12 weeks    PT Treatment/Interventions ADLs/Self Care Home Management;Electrical Stimulation;Therapeutic activities;Wheelchair mobility training;Vasopneumatic Device;Joint Manipulations;Vestibular;Passive range of motion;Patient/family education;Therapeutic exercise;DME Instruction;Biofeedback;Aquatic Therapy;Moist Heat;Gait training;Balance training;Orthotic Fit/Training;Dry needling;Energy conservation;Taping;Splinting;Neuromuscular re-education;Cryotherapy;Ultrasound;Functional mobility training    PT Next Visit Plan Continue with progressive standing/transfer training once MD approved    PT Home Exercise Plan no changes    Consulted and Agree with Plan of Care Patient             Patient will benefit from skilled therapeutic intervention in order to improve the following deficits and impairments:  Abnormal gait, Decreased balance, Decreased endurance, Decreased mobility, Difficulty walking, Hypomobility, Impaired sensation, Decreased range of motion,  Improper  body mechanics, Impaired perceived functional ability, Decreased activity tolerance, Decreased knowledge of use of DME, Decreased safety awareness, Decreased strength, Impaired flexibility, Impaired UE functional use, Postural dysfunction  Visit Diagnosis: Abnormality of gait and mobility  Difficulty in walking, not elsewhere classified  Muscle weakness (generalized)  Unsteadiness on feet     Problem List Patient Active Problem List   Diagnosis Date Noted   Acute appendicitis with appendiceal abscess 05/30/2018   Hypocalcemia 02/15/2018   Dysuria 02/15/2018   Bilateral lower extremity edema 02/11/2018   Vaginal yeast infection 01/30/2018   Chest tightness 01/27/2018   Healthcare-associated pneumonia 01/25/2018   Pleural effusion, not elsewhere classified 01/25/2018   Acute deep vein thrombosis (DVT) of left lower extremity ([place]) 01/24/2018   Acute deep vein thrombosis (DVT) of right lower extremity ([place]) 01/24/2018   Chronic allergic rhinitis 01/24/2018   Depression with anxiety 01/24/2018   UTI due to Klebsiella species 01/24/2018   Protein-calorie malnutrition, severe ([place]) 01/24/2018   S/P insertion of IVC (inferior vena caval) filter 01/24/2018   Reactive depression    Hypokalemia    Leukocytosis    Essential hypertension    Trauma    Tetraparesis ([facility])    Neuropathic pain    Neurogenic bowel    Neurogenic bladder    Benign essential HTN    Acute blood loss anemia    Central cord syndrome at C6 level of cervical spinal cord ([place]) 11/29/2017   Central cord syndrome ([place]) 11/29/2017    Lewis Moccasin, PT 01/05/2021, 1:28 PM  [place] [facility] SERVICES [place], [place], [address] Phone: [phone]   Fax:  [phone]  Name: Sherry [name] MRN: [id number] Date of Birth: 07-08-1934

## 2021-01-07 ENCOUNTER — Ambulatory Visit: Payer: Medicare HMO

## 2021-01-07 ENCOUNTER — Other Ambulatory Visit: Payer: Self-pay

## 2021-01-07 DIAGNOSIS — M6281 Muscle weakness (generalized): Secondary | ICD-10-CM

## 2021-01-07 DIAGNOSIS — R269 Unspecified abnormalities of gait and mobility: Secondary | ICD-10-CM

## 2021-01-07 DIAGNOSIS — R2681 Unsteadiness on feet: Secondary | ICD-10-CM

## 2021-01-07 DIAGNOSIS — R278 Other lack of coordination: Secondary | ICD-10-CM

## 2021-01-07 DIAGNOSIS — R262 Difficulty in walking, not elsewhere classified: Secondary | ICD-10-CM

## 2021-01-07 NOTE — Therapy (Signed)
[place] MAIN [facility] SERVICES [address] [place], [place], [address] Phone: [phone]   Fax:  [phone]  Physical Therapy Treatment  Patient Details  Name: Sherry [name] MRN: [id number] Date of Birth: February 18, 1934 No data recorded  Encounter Date: 01/07/2021   PT End of Session - 01/07/21 1146     Visit Number 116    Number of Visits 136    Date for PT Re-Evaluation 03/16/21    Authorization Time Period Recert 3/0/0762- 2/63/3354; PN on 06/06/2561; Recert 8/93/7342- 87/68/1157; PN on 2/62/0355; Recert 97/41/6384- 5/36/4680    PT Start Time 1148    PT Stop Time 1229    PT Time Calculation (min) 41 min    Equipment Utilized During Treatment Gait belt    Activity Tolerance Patient tolerated treatment well;Patient limited by fatigue;Patient limited by pain    Behavior During Therapy [facility] for tasks assessed/performed             Past Medical History:  Diagnosis Date   Acute blood loss anemia    Arthritis    Cancer ([place])    skin   Central cord syndrome at C6 level of cervical spinal cord ([place]) 11/29/2017   Hypertension    Protein-calorie malnutrition, severe ([place]) 01/24/2018   S/P insertion of IVC (inferior vena caval) filter 01/24/2018   Tetraparesis ([place])     Past Surgical History:  Procedure Laterality Date   ANTERIOR CERVICAL DECOMP/DISCECTOMY FUSION N/A 11/29/2017   Procedure: Cervical five-six, six-seven Anterior Cervical Decompression Fusion;  Surgeon: Judith Part, MD;  Location: [place];  Service: Neurosurgery;  Laterality: N/A;  Cervical five-six, six-seven Anterior Cervical Decompression Fusion   CATARACT EXTRACTION     IR IVC FILTER PLMT / S&I /IMG GUID/MOD SED  12/08/2017    There were no vitals filed for this visit.   Subjective Assessment - 01/07/21 1145     Subjective Patient reports that she trying to be more positive stating she was feeling pretty negative earlier this week at home but feeling good  today and eager to participate.    Patient is accompained by: --   [name] (aide)   Pertinent History Pt is an 86 yo female that fell in 2019, fractured vertebrae in her neck and in her low back per family. 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 After hospital stay, pt discharged to [facility] for ~1 month, experienced severe UTI as well as bilateral DVT (IVC filter placed). Discharged to [facility], stayed for about a year, and then moved to [facility] in April 2021. Was receiving PT, but per family facility reported that did not have adequate equipment to maximize PT for her. Pt until about 1 month ago was practicing sit to stand transfers with 1-2 people, and working on static standing. Has a brace for L foot due to PF. Pt currently needs assistance with all ADLs (able to assist with donning/doffing her shirt), bed baths, and needs a hoyer lift for transfers. Able to drive power wheelchair without assistance.    Limitations Standing;Walking;House hold activities;Lifting    How long can you sit comfortably? NA    How long can you stand comfortably? unable    How long can you walk comfortably? unable    Patient Stated Goals "to stand up and walk"    Currently in Pain? No/denies    Pain Onset In the past 7 days             INTERVENTIONS:  INTERVENTIONS:   *Patient arrived again without Left boot due  velcro straps currently being replaced per patient report- Patient limited to therex only today.    Therapeutic exercises: Reclined exercises:  -B Heel slides into manual resistance leg press  2 sets x 15 reps (AAROM For heel slides and manual resistance with leg press)-patient able to push more with left LE and focused on terminal knee ext control.  -Hip abduction/add with min physical resistance B LE 2 sets of 15 reps -SLR- BLE 2 sets of 12 reps.  -Seated abd sit up x 12 reps -Quad sets with 5 sec hold BLE- VC to hold right LE for 5 sec    -Seated exercises:  -  Lumbar ext into abd contraction (Sit up) 2 sets of 12 reps (CGA for control)  - Matrix cable system- Bicep curl (2.5 lb)  2 sets of 12 reps- each UE (patient able to grip well enough without physical assist today) -Matrix cable system- Tricep press down (7.5 lb) 2 sets of 12 reps each UE  Education provided throughout session via VC/TC and demonstration to facilitate movement at target joints and correct muscle activation for all testing and exercises performed.                          PT Education - 01/07/21 1146     Education Details Exercise technique    Person(s) Educated Patient    Methods Explanation;Demonstration;Tactile cues;Verbal cues    Comprehension Verbalized understanding;Returned demonstration;Verbal cues required;Need further instruction              PT Short Term Goals - 03/10/20 2211       PT SHORT TERM GOAL #1   Title The patient will perform initial HEP with minimum assistance in order to improve strength and function.    Baseline Patient demonstrating independence with initial HEP as of 03/10/2020 with no questions or difficulty.    Time 4    Period Weeks    Status Achieved    Target Date 02/11/20               PT Long Term Goals - 12/22/20 1155       PT LONG TERM GOAL #1   Title The patient will be compliant with finalized HEP with minimum assistance in preparation for self management and maintenance of condition.    Baseline 03/10/2020- Patient familiar with initial HEP and will keep goal active as HEP becomes more progressive including possible transfers and standing activities. 04/07/2020- Patient continues to report compliance with LE strengthening home exercises without questions or concerns. 04/21/2020- Patient able to verbalize and demonstrate good understanding of current HEP including some supine and seated LE strengthening exercises. Reviewed and patient performed 10 reps today with minimal cueing. Will keep goal active to  progress HEP as appropriate. 05/28/2020- patient reports continues to perform LE strengthening as able and some help from caregiver as able. No changes at this time to home program as patient is limited to supine/seated therex. 07/02/2020- Patient continues to report compliance with current supine and seated LE strengtheing home program and states no questions or concerns regarding her plan.    Time 12    Period Weeks    Status Achieved      PT LONG TERM GOAL #2   Title The patient will demonstrate at least 10 point improvement on FOTO score indicating an improved ability to perform functional activities.    Baseline  on eval (/720) score 12; 10/22/19: 12; 12/10/19: 18, 12/13: 18: 04/07/2020- Will assess next visit. 07/02/2020- Will obtain next visit. 09/24/2020= Will obtain next visit. 11/12/2020= 12; 12/22/2020= 12    Time 12    Period Weeks    Status On-going    Target Date 03/16/21      PT LONG TERM GOAL #3   Title The patient will demonstrate lateral scooting  for assistance with transfers with minA to maximize independence.    Baseline 10/22/19: Pt requires modA+1 for lateral scooting; 12/10/19: modA+1, 12/13: modA-maxA, 12/27: modA-maxA, 03/10/2020- ModA-MaxA- increased verbal cues and visual demonstration. 04/07/2020- Continued Max assist to perform forward/lateral scoot. 04/21/2020- Patient was able to demo slight lateral scoot with CGA approx 12 in then fatigued requiring max assist- she is able to scoot forward by leaning without significant issues. 05/28/2020- While sitting in chair- patient able to perform forward and lateral scoot with VC/visual demo and increased time allowed- CGA today but not consistent yet- will keep goal active at this time to continue to focus on strengthening and improving technique. 07/02/2020- Patient able to demonstrate minimal ability to laterally shift weigh on mat table requiring max assist yet is able to forward scoot out to edge of mat table with min Assist. 08/20/2020 -  Patient was able to demonstrate minimal lateral scooting at edge of mat while holding feet still so they did not slip on tile surface. She continues to be able to demonstrate scoot forward and uses tilt option to recline to position safely back into seat of power wheelchair. 09/25/2020- Patient able to sit on mat and minimally scoot laterally but requires min/mod A for efficient scooting. Will end goal at this time due to plateau in progress with scooting over extended time.    Time 12    Period Weeks    Status Not Met      PT LONG TERM GOAL #4   Title The patient will demonstrate a squat pivot transfer with minimum assistance to maximize independence and mobility.    Baseline 10/22/19: unable to safely attempt at this time; 12/10/19: unable to safely attempt at this time, 01/14/20: unable to safety attempt at this time. 03/10/2020- Patient able to perform Stand pivot transfer with Maximal assist. 04/07/2020- Patient continues to require Max assist with sit to stand- will assist SPT next visit. 04/21/2020- Patient presents with Max assist to perform Sit to stand Transfer- unable to pivot or move Left LE well without difficulty. 07/02/2020= Max assist with SPT and minimal ability to pivot. 7/202/2022-Patient able to stand with minimal assist this visit (as good as CGA last visit) and able to move right foot to pivot and performing with moderate assist today. 09/24/2020- Patient able to perform sit to stand with Min assist from lift chair. Patient has demo inconsistency and will keep goal active.  12/17/2020- Goal is currently not appropriate as patient fractured her ankle and currently NWB. 12/22/2020=Goal is currently not appropriate as patient fractured her ankle and currently NWB.    Time 12    Period Weeks    Status On-going    Target Date 03/16/21      PT LONG TERM GOAL #5   Title The patient will demonstrate sitting without UE support for 2-5 minutes at EOB to improve participation and maximize  independence with ADLs.    Baseline 10/22/19: Pt able to sit unsupported for at least 2 minutes at edge of bed. 04/21/2020- Patient continues to demo good sitting at edge of mat >  5 miin without difficulty or back support.    Time 12    Period Weeks    Status Achieved      Additional Long Term Goals   Additional Long Term Goals Yes      PT LONG TERM GOAL #6   Title Patient will demonstrate improved functional LE strength as seen by consistent ability to stand > 2 min (3 out of 4 trials) with Mod/Max A for improved LE strength with transfers.    Baseline 05/28/2020- Patient able to inconsistent stand 1-2 min right now in //bars with Mod/Max A. 07/02/2020- Patient was able to progress last 2 visit to standing using bilateral platform attachment between 1-2 min which is a progression from the parallel bars. 08/20/2020- Patient performed static standing with min- mod A using bilateral platform attachment on walker for 6 min 30 sec today with assist for trunk control and intermittent verbal cues for posture. 09/24/2020- Patient has consistently been able to stand >2 min in // bars and when using bilateral Platform walker. 12/23/2020= Will reopen this goal as patient should transition back to weightbearing and again practice standing in new cert.    Time 12    Period Weeks    Status Deferred    Target Date 03/16/21      PT LONG TERM GOAL #7   Title Patient/caregiver will demonstrate assisted transfer using [place] Lift consistently Independently for improved ability to transfer at home from lift chair to bed.    Baseline 09/24/2020- Patient requires hoyer lift (Dependent) for safe transfers.  12/17/2020- Patient currently still dependent on hoyer as she is currently non-weight bearing due to recent ankle fracture.    Time 12    Period Weeks    Status On-going    Target Date 03/16/21      PT LONG TERM GOAL #8   Title Patient will demo improved right LE quad strength as seen by ability to extend knee to 20  deg from 0 or less while performing a Long arc quad (for improved terminal knee ext and more full ROM)  for improved ability to bearing weight with Right LE.    Baseline 12/22/2020=able to extend knee to approx 35 deg from zero during long arc quad    Time 12    Period Weeks    Status New    Target Date 03/16/20                   Plan - 01/07/21 1146     Clinical Impression Statement Patient performed well with all strengthening with minimal rest breaks. She was able to push harder with manual resistance in leg press today and able to perform some lumbar ext with manual resistance today. She also was able to increase resistance with UE tricep press down and improved grip during today's session. Pt will benefit from skilled physical therapy to increase mobility and strength to improve patient's quality of lif    Personal Factors and Comorbidities Age;Time since onset of injury/illness/exacerbation;Comorbidity 3+;Fitness    Comorbidities HTN, quadriparesis, history of DVT, neurogenic bladder    Examination-Activity Limitations Bathing;Hygiene/Grooming;Squat;Bed Mobility;Lift;Bend;Stand;Engineer, manufacturing;Toileting;Self Feeding;Transfers;Continence;Sit;Dressing;Sleep;Carry    Examination-Participation Restrictions Church;Laundry;Cleaning;Medication Management;Community Activity;Meal Prep;Interpersonal Relationship    Stability/Clinical Decision Making Evolving/Moderate complexity    Rehab Potential Fair    PT Frequency 2x / week    PT Duration 12 weeks    PT Treatment/Interventions ADLs/Self Care Home Management;Electrical Stimulation;Therapeutic activities;Wheelchair mobility training;Vasopneumatic Device;Joint Manipulations;Vestibular;Passive range of motion;Patient/family education;Therapeutic exercise;DME Instruction;Biofeedback;Aquatic Therapy;Moist  Heat;Gait training;Balance training;Orthotic Fit/Training;Dry needling;Energy conservation;Taping;Splinting;Neuromuscular  re-education;Cryotherapy;Ultrasound;Functional mobility training    PT Next Visit Plan Continue with progressive standing/transfer training once MD approved    PT Home Exercise Plan no changes    Consulted and Agree with Plan of Care Patient             Patient will benefit from skilled therapeutic intervention in order to improve the following deficits and impairments:  Abnormal gait, Decreased balance, Decreased endurance, Decreased mobility, Difficulty walking, Hypomobility, Impaired sensation, Decreased range of motion, Improper body mechanics, Impaired perceived functional ability, Decreased activity tolerance, Decreased knowledge of use of DME, Decreased safety awareness, Decreased strength, Impaired flexibility, Impaired UE functional use, Postural dysfunction  Visit Diagnosis: Abnormality of gait and mobility  Difficulty in walking, not elsewhere classified  Muscle weakness (generalized)  Unsteadiness on feet     Problem List Patient Active Problem List   Diagnosis Date Noted   Acute appendicitis with appendiceal abscess 05/30/2018   Hypocalcemia 02/15/2018   Dysuria 02/15/2018   Bilateral lower extremity edema 02/11/2018   Vaginal yeast infection 01/30/2018   Chest tightness 01/27/2018   Healthcare-associated pneumonia 01/25/2018   Pleural effusion, not elsewhere classified 01/25/2018   Acute deep vein thrombosis (DVT) of left lower extremity ([place]) 01/24/2018   Acute deep vein thrombosis (DVT) of right lower extremity ([facility]) 01/24/2018   Chronic allergic rhinitis 01/24/2018   Depression with anxiety 01/24/2018   UTI due to Klebsiella species 01/24/2018   Protein-calorie malnutrition, severe ([place]) 01/24/2018   S/P insertion of IVC (inferior vena caval) filter 01/24/2018   Reactive depression    Hypokalemia    Leukocytosis    Essential hypertension    Trauma    Tetraparesis ([facility])    Neuropathic pain    Neurogenic bowel    Neurogenic bladder    Benign  essential HTN    Acute blood loss anemia    Central cord syndrome at C6 level of cervical spinal cord ([place]) 11/29/2017   Central cord syndrome ([place]) 11/29/2017    Lewis Moccasin, PT 01/08/2021, 11:17 AM  [place] MAIN [facility] SERVICES [address] [place], [place], [address] Phone: [phone]   Fax:  [phone]  Name: Sherry [name] MRN: [id number] Date of Birth: 1934/10/11

## 2021-01-08 NOTE — Therapy (Signed)
[place] MAIN [facility] SERVICES [address] [place], [place], [address] Phone: [phone]   Fax:  [phone]  Occupational Therapy Treatment  Patient Details  Name: Sherry [name] MRN: [id number] Date of Birth: 1934-05-17 No data recorded  Encounter Date: 01/07/2021   OT End of Session - 01/08/21 1055     Visit Number 92    Number of Visits 96    Date for OT Re-Evaluation 01/12/21    Authorization Time Period Progress report period starting 11/26/2020    OT Start Time 1109    OT Stop Time 1148    OT Time Calculation (min) 39 min    Equipment Utilized During Treatment moist heat    Activity Tolerance Patient tolerated treatment well    Behavior During Therapy WFL for tasks assessed/performed             Past Medical History:  Diagnosis Date   Acute blood loss anemia    Arthritis    Cancer ([place])    skin   Central cord syndrome at C6 level of cervical spinal cord ([place]) 11/29/2017   Hypertension    Protein-calorie malnutrition, severe ([place]) 01/24/2018   S/P insertion of IVC (inferior vena caval) filter 01/24/2018   Tetraparesis ([place])     Past Surgical History:  Procedure Laterality Date   ANTERIOR CERVICAL DECOMP/DISCECTOMY FUSION N/A 11/29/2017   Procedure: Cervical five-six, six-seven Anterior Cervical Decompression Fusion;  Surgeon: Judith Part, MD;  Location: [place];  Service: Neurosurgery;  Laterality: N/A;  Cervical five-six, six-seven Anterior Cervical Decompression Fusion   CATARACT EXTRACTION     IR IVC FILTER PLMT / S&I /IMG GUID/MOD SED  12/08/2017    There were no vitals filed for this visit.   Subjective Assessment - 01/07/21 1053     Subjective  Pt reports no new complaints.    Patient is accompanied by: Family member    Pertinent History Patient s/p fall November 29, 2017 resulting in diagnosis of central cord syndrome at C 6 level.  She has had therapy in multiple venues but with recent move to [facility], therapy staff recommended she seek outpatient therapy for her needs.    Patient Stated Goals Patient would like to be able to move better in bed, stand and perform self care tasks.    Currently in Pain? No/denies    Pain Score 0-No pain    Pain Onset In the past 7 days            Occupational Therapy Treatment: Manual Therapy: Moist heat applied to bilat hands and shoulders for muscle relaxation, alternating sides to enable simultaneous passive stretching to BUEs x10 min, working to increase ROM for self care performance.  Focus on bilat shoulder flex/abd/ER/horiz abd/add, bilat wrist flex/extension, and digit flex/ext/abd all joints in bilat hands.   Therapeutic Exercise: Performed active digit flex/ext and digit abd/add, wrist flex/ext, and bilat shoulder flex/abd, working to increase active range for self care.  Intermittent tactile cues for form and technique.   Response to Treatment: See Plan/clinical impression below.    OT Education - 01/07/21 1054     Education Details BUE stretching for preventing worsening joint contractures    Person(s) Educated Patient;Caregiver(s)    Methods Explanation;Demonstration;Verbal cues    Comprehension Verbalized understanding;Need further instruction;Verbal cues required                 OT Long Term Goals - 12/31/20 1112  OT LONG TERM GOAL #1   Title Patient and caregiver will demonstrate understanding of home exercise program for ROM.    Baseline Pt.'s current restorative aide is retiring at the end of December. Pt. continues to have a restorative aide rehab aide assist her wih exercises at [facility]. 8/8: Independently completes/directs HEP mulitple times/week    Time 12    Period Weeks    Target Date 03/15/20      OT LONG TERM GOAL #4   Title Patient will demonstrate improved composite finger flexion to hold deodorant to apply to underarms.    Baseline 12/31/2020:  Pt. is progressing, and is able to reach to  grasp, and hold narrow cones. 10/20/2020: Pt. conitnues to make steady progress with bilateral digit flexion.Pt. continues to present with stiifness/tightness which is hindering pt.'s ability to formulate a full composite fist. Pt. continues to present with improving finger flexion of R hand for a partial gross grasp, but unable to grasp and hold items. pt. conitnues to present with limited digit flexion of L hand. Pt. has active left thumb abduction. 8/8: holds cup with 2 hands, continues to have limited composite finger flexion L worse than R.    Time 12    Period Weeks    Status On-going    Target Date 03/15/20      OT LONG TERM GOAL #6   Title Patient will increase right UE ROM to comb the back of her hair with modified independence.    Baseline 12/31/2020: Pt. is able to comb her hair. 10/20/2020: Pt. continues to progress wtith RRUE ROM, and is able to use a pic for the back of her head, however is unable to brush the back of her hair. 07/30/2020: Pt. is now able to comb the back of her hair with a pic, however continues to work on completing the task efficiently. 8/8: Achieves with great effort, has assistance for efficiancy    Time 12    Period Weeks    Status Partially Met    Target Date 01/12/21      OT LONG TERM GOAL #7   Title Pt. will independently, and efficiently send text messages on her phone.    Baseline 12/31/2020: pt. is now able to type text messages on her phone. 10/20/2020: Pt. is now able to type a text message on the phone, however not efficiently taking increased time to complete. 07/30/2020: Pt. continues to present with limited BUE strength, and [facility] skills. 8/8: independtly drafts text messages, continues to work on accuracy and efficiancy 2/2 limited [facility]    Time 12    Period Weeks    Status Partially Met    Target Date 01/12/21      OT LONG TERM GOAL #8   Title Pt. will write, and sign her name with 100% legibility, and modified independence.    Baseline 12/31/2020:Pt.  continnues to fill out her daily menu and complete puzzles. 10/20/2020: Pt. continues to make progress overall. Pt. continues to consistently fill out her daily menu, and writing for crossword puzzles. Pt. is able to maintain grasp on a wide width pen. Contineus to work on Media planner. 8/8: writes daily, continues to work on improving speed    Time 12    Status Partially Met    Target Date 01/12/21                Plan - 01/07/21 1111     Clinical Impression Statement Pt. continues to make progress,  and is improving with reaching further with her BUEs in preparation for improving overhead ADLs, and hair care, and continues to use her hands for self-feeding, holding utensils, and writing. Pt. tolerated ROM well without reports of pain, or discomfort. Pt. continues to present with bilateral wrist flexor tightness, and digit extensor tightness. Pt. continues to benefit from working on impoving ROM in order to work towards increasing engagement of her bilateral hands during ADLs, and IADL tasks.    OT Occupational Profile and History Detailed Assessment- Review of Records and additional review of physical, cognitive, psychosocial history related to current functional performance    Occupational performance deficits (Please refer to evaluation for details): ADL's;IADL's;Leisure;Social Participation    Body Structure / Function / Physical Skills ADL;Continence;Dexterity;Flexibility;Strength;ROM;Balance;Coordination;FMC;IADL;Endurance;UE functional use;Decreased knowledge of use of DME;GMC    Psychosocial Skills Coping Strategies    Rehab Potential Fair    Clinical Decision Making Several treatment options, min-mod task modification necessary    Comorbidities Affecting Occupational Performance: Presence of comorbidities impacting occupational performance    Comorbidities impacting occupational performance description: contractures of bilateral hands, dependent transfers,    Modification or  Assistance to Complete Evaluation  No modification of tasks or assist necessary to complete eval    OT Frequency 2x / week    OT Duration 12 weeks    OT Treatment/Interventions Self-care/ADL training;Cryotherapy;Therapeutic exercise;DME and/or AE instruction;Balance training;Neuromuscular education;Manual Therapy;Splinting;Moist Heat;Passive range of motion;Therapeutic activities;Patient/family education    Consulted and Agree with Plan of Care Patient             Patient will benefit from skilled therapeutic intervention in order to improve the following deficits and impairments:   Body Structure / Function / Physical Skills: ADL, Continence, Dexterity, Flexibility, Strength, ROM, Balance, Coordination, FMC, IADL, Endurance, UE functional use, Decreased knowledge of use of DME, GMC   Psychosocial Skills: Coping Strategies   Visit Diagnosis: Muscle weakness (generalized)  Other lack of coordination    Problem List Patient Active Problem List   Diagnosis Date Noted   Acute appendicitis with appendiceal abscess 05/30/2018   Hypocalcemia 02/15/2018   Dysuria 02/15/2018   Bilateral lower extremity edema 02/11/2018   Vaginal yeast infection 01/30/2018   Chest tightness 01/27/2018   Healthcare-associated pneumonia 01/25/2018   Pleural effusion, not elsewhere classified 01/25/2018   Acute deep vein thrombosis (DVT) of left lower extremity ([place]) 01/24/2018   Acute deep vein thrombosis (DVT) of right lower extremity ([place]) 01/24/2018   Chronic allergic rhinitis 01/24/2018   Depression with anxiety 01/24/2018   UTI due to Klebsiella species 01/24/2018   Protein-calorie malnutrition, severe ([place]) 01/24/2018   S/P insertion of IVC (inferior vena caval) filter 01/24/2018   Reactive depression    Hypokalemia    Leukocytosis    Essential hypertension    Trauma    Tetraparesis ([facility])    Neuropathic pain    Neurogenic bowel    Neurogenic bladder    Benign essential HTN    Acute  blood loss anemia    Central cord syndrome at C6 level of cervical spinal cord ([place]) 11/29/2017   Central cord syndrome ([place]) 11/29/2017   Leta Speller, MS, OTR/L  Darleene Cleaver, OT 01/08/2021, 11:11 AM  [place] MAIN [facility] SERVICES [address] [place], [place], [address] Phone: [phone]   Fax:  [phone]  Name: BRITTAINY BUCKER MRN: [id number] Date of Birth: 03-23-1934

## 2021-01-12 ENCOUNTER — Ambulatory Visit: Payer: Medicare HMO

## 2021-01-12 ENCOUNTER — Encounter: Payer: Self-pay | Admitting: Occupational Therapy

## 2021-01-12 ENCOUNTER — Other Ambulatory Visit: Payer: Self-pay

## 2021-01-12 ENCOUNTER — Ambulatory Visit: Payer: Medicare HMO | Admitting: Occupational Therapy

## 2021-01-12 DIAGNOSIS — R262 Difficulty in walking, not elsewhere classified: Secondary | ICD-10-CM

## 2021-01-12 DIAGNOSIS — R269 Unspecified abnormalities of gait and mobility: Secondary | ICD-10-CM

## 2021-01-12 DIAGNOSIS — R140 Abdominal distension (gaseous): Secondary | ICD-10-CM

## 2021-01-12 DIAGNOSIS — M6281 Muscle weakness (generalized): Secondary | ICD-10-CM | POA: Diagnosis not present

## 2021-01-12 DIAGNOSIS — R278 Other lack of coordination: Secondary | ICD-10-CM

## 2021-01-12 DIAGNOSIS — R2689 Other abnormalities of gait and mobility: Secondary | ICD-10-CM

## 2021-01-12 DIAGNOSIS — S14129S Central cord syndrome at unspecified level of cervical spinal cord, sequela: Secondary | ICD-10-CM

## 2021-01-12 DIAGNOSIS — R2681 Unsteadiness on feet: Secondary | ICD-10-CM

## 2021-01-12 NOTE — Therapy (Signed)
[place] MAIN [facility] SERVICES [address] [place], [place], [address] Phone: [phone]   Fax:  [phone]  Occupational Therapy Treatment/Recertification  Patient Details  Name: Sherry [name] MRN: [id number] Date of Birth: March 06, 1934 No data recorded  Encounter Date: 01/12/2021   OT End of Session - 01/12/21 1153     Visit Number 93    Number of Visits 106    Date for OT Re-Evaluation 04/06/21    Authorization Time Period Progress report period starting 11/26/2020    OT Start Time 1100    OT Stop Time 1145    OT Time Calculation (min) 45 min    Activity Tolerance Patient tolerated treatment well    Behavior During Therapy [facility] for tasks assessed/performed             Past Medical History:  Diagnosis Date   Acute blood loss anemia    Arthritis    Cancer ([place])    skin   Central cord syndrome at C6 level of cervical spinal cord ([place]) 11/29/2017   Hypertension    Protein-calorie malnutrition, severe ([place]) 01/24/2018   S/P insertion of IVC (inferior vena caval) filter 01/24/2018   Tetraparesis ([place])     Past Surgical History:  Procedure Laterality Date   ANTERIOR CERVICAL DECOMP/DISCECTOMY FUSION N/A 11/29/2017   Procedure: Cervical five-six, six-seven Anterior Cervical Decompression Fusion;  Surgeon: Judith Part, MD;  Location: [place];  Service: Neurosurgery;  Laterality: N/A;  Cervical five-six, six-seven Anterior Cervical Decompression Fusion   CATARACT EXTRACTION     IR IVC FILTER PLMT / S&I /IMG GUID/MOD SED  12/08/2017    There were no vitals filed for this visit.   Subjective Assessment - 01/12/21 1152     Subjective  Pt. reports having had a laser procedure this past week, and has noticed improvements.    Patient is accompanied by: Family member    Pertinent History Patient s/p fall November 29, 2017 resulting in diagnosis of central cord syndrome at C 6 level.  She has had therapy in multiple venues but  with recent move to [facility], therapy staff recommended she seek outpatient therapy for her needs.    Currently in Pain? No/denies              OT TREATMENT     Pt. tolerated bilateral shoulder flexion, extension, abduction, elbow flexion, extension forearm supination AAROM/AROM. PROM bilateral wrist extension, digit MP, PIP, and DIP flexion, and extension, thumb radial, and palmar abduction in conjunction with moist heat. Pt. worked on [employer], and reciprocal motion using the UBE while sitting for 8 min. with minimal resistance. Pt. Worked on opening cabinetry doors, and reaching to shelves. Pt. Worked on BUE strenthening with a 2# dowel weight.    Pt. continues to make steady progress, and is improving with reaching further with her BUEs in preparation for improving overhead ADLs, and haicare, reaching up for items on shelves, and reaching out to pull herself closer to items, and for repositioning. Pt. was able to tolerate the UBE for 8 min. with the UBE elevated for increased ROM. Pt. continues to be able to reach further to assist with performing haircare, and continues to use her hands for self-feeding, typing on the cell phone, holding utensils, and writing. Pt. tolerated ROM well without reports of pain, or discomfort. Pt. continues to present with bilateral wrist flexor tightness, and digit extensor tightness. Pt. continues to benefit from working on impoving ROM in  order to work towards increasing engagement of her bilateral hands during ADLs, and IADL tasks.                         OT Education - 01/12/21 1153     Education Details BUE stretching for preventing worsening joint contractures    Person(s) Educated Patient;Caregiver(s)    Methods Explanation;Demonstration;Verbal cues    Comprehension Verbalized understanding;Need further instruction;Verbal cues required                 OT Long Term Goals - 01/12/21 1201       OT LONG TERM GOAL  #1   Title Patient and caregiver will demonstrate understanding of home exercise program for ROM.    Baseline Pt.'s current restorative aide is retiring at the end of December. Pt. continues to have a restorative aide rehab aide assist her wih exercises at [facility]. 8/8: Independently completes/directs HEP mulitple times/week    Time 12    Period Weeks    Status Partially Met    Target Date 04/06/21      OT LONG TERM GOAL #2   Title Patient will demonstrate ability to don shirt with min assist from seated position.    Baseline Pt. is indepedent with a larger shirt, and continues to require increased time to complete,    Time 12    Period Weeks    Status Achieved      OT LONG TERM GOAL #3   Title Patient will demonstrate improved sitting balance at the edge of the bed to participate in self care tasks.    Baseline Pt. has an air mattress at [facility]    Time 12    Period Weeks    Status Deferred      OT LONG TERM GOAL #4   Title Patient will demonstrate improved composite finger flexion to hold deodorant to apply to underarms.    Baseline 01/12/2021:  Pt. is progressing, and is able to reach to grasp, and hold narrow cones. 10/20/2020: Pt. conitnues to make steady progress with bilateral digit flexion.Pt. continues to present with stiifness/tightness which is hindering pt.'s ability to formulate a full composite fist. Pt. continues to present with improving finger flexion of R hand for a partial gross grasp, but unable to grasp and hold items. pt. conitnues to present with limited digit flexion of L hand. Pt. has active left thumb abduction. 8/8: holds cup with 2 hands, continues to have limited composite finger flexion L worse than R.    Time 12    Period Weeks    Status On-going    Target Date 04/06/21      OT LONG TERM GOAL #5   Title Pt will complete self feeding with setup, use of universal cuff and minimal spillage.    Baseline inconsistent with feeding and increased spillage.  Pt is able to use a fork without spillage. Pt. has spillage with a spoon.    Time 12    Period Weeks    Status Achieved    Target Date 12/10/19      OT LONG TERM GOAL #6   Title Patient will increase right UE ROM to comb the back of her hair with modified independence.    Baseline 01/12/2022: Pt. is able to comb her hair, reaching to the back of her hair.. 10/20/2020: Pt. continues to progress wtith RRUE ROM, and is able to use a pic for the back of her  head, however is unable to brush the back of her hair. 07/30/2020: Pt. is now able to comb the back of her hair with a pic, however continues to work on completing the task efficiently. 8/8: Achieves with great effort, has assistance for efficiancy    Time 12    Period Weeks    Status Partially Met    Target Date 04/06/21      OT LONG TERM GOAL #7   Title Pt. will independently, and efficiently send text messages on her phone.    Baseline 01/12/2022:: pt. is now able to type text messages on her phone. 10/20/2020: Pt. is now able to type a text message on the phone, however not efficiently taking increased time to complete. 07/30/2020: Pt. continues to present with limited BUE strength, and [facility] skills. 8/8: independtly drafts text messages, continues to work on accuracy and efficiancy 2/2 limited [facility]    Time 12    Period Weeks    Status Partially Met    Target Date 04/06/21      OT LONG TERM GOAL #8   Title Pt. will write, and sign her name with 100% legibility, and modified independence.    Baseline 01/12/2021: Pt. continnues to fill out her daily menu and complete puzzles. 10/20/2020: Pt. continues to make progress overall. Pt. continues to consistently fill out her daily menu, and writing for crossword puzzles. Pt. is able to maintain grasp on a wide width pen. Contineus to work on Media planner. 8/8: writes daily, continues to work on improving speed    Time 12    Period Weeks    Status Partially Met    Target Date 04/06/21                    Plan - 01/12/21 1155     Clinical Impression Statement Pt. continues to make steady progress, and is improving with reaching further with her BUEs in preparation for improving overhead ADLs, and haicare, reaching up for items on shelves, and reaching out to pull herself closer to items, and for repositioning. Pt. was able to tolerate the UBE for 8 min. with the UBE elevated for increased ROM. Pt. continues to be able to reach further to assist with performing haircare, and continues to use her hands for self-feeding, typing on the cell phone, holding utensils, and writing. Pt. tolerated ROM well without reports of pain, or discomfort. Pt. continues to present with bilateral wrist flexor tightness, and digit extensor tightness. Pt. continues to benefit from working on impoving ROM in order to work towards increasing engagement of her bilateral hands during ADLs, and IADL tasks.      OT Occupational Profile and History Detailed Assessment- Review of Records and additional review of physical, cognitive, psychosocial history related to current functional performance    Occupational performance deficits (Please refer to evaluation for details): ADL's;IADL's;Leisure;Social Participation    Body Structure / Function / Physical Skills ADL;Continence;Dexterity;Flexibility;Strength;ROM;Balance;Coordination;FMC;IADL;Endurance;UE functional use;Decreased knowledge of use of DME;GMC    Psychosocial Skills Coping Strategies    Rehab Potential Fair    Clinical Decision Making Several treatment options, min-mod task modification necessary    Comorbidities Affecting Occupational Performance: Presence of comorbidities impacting occupational performance    Comorbidities impacting occupational performance description: contractures of bilateral hands, dependent transfers,    Modification or Assistance to Complete Evaluation  No modification of tasks or assist necessary to complete eval    OT Frequency  2x / week    OT Duration  12 weeks    OT Treatment/Interventions Self-care/ADL training;Cryotherapy;Therapeutic exercise;DME and/or AE instruction;Balance training;Neuromuscular education;Manual Therapy;Splinting;Moist Heat;Passive range of motion;Therapeutic activities;Patient/family education    Consulted and Agree with Plan of Care Patient             Patient will benefit from skilled therapeutic intervention in order to improve the following deficits and impairments:   Body Structure / Function / Physical Skills: ADL, Continence, Dexterity, Flexibility, Strength, ROM, Balance, Coordination, FMC, IADL, Endurance, UE functional use, Decreased knowledge of use of DME, GMC   Psychosocial Skills: Coping Strategies   Visit Diagnosis: Muscle weakness (generalized)  Other lack of coordination    Problem List Patient Active Problem List   Diagnosis Date Noted   Acute appendicitis with appendiceal abscess 05/30/2018   Hypocalcemia 02/15/2018   Dysuria 02/15/2018   Bilateral lower extremity edema 02/11/2018   Vaginal yeast infection 01/30/2018   Chest tightness 01/27/2018   Healthcare-associated pneumonia 01/25/2018   Pleural effusion, not elsewhere classified 01/25/2018   Acute deep vein thrombosis (DVT) of left lower extremity ([place]) 01/24/2018   Acute deep vein thrombosis (DVT) of right lower extremity ([place]) 01/24/2018   Chronic allergic rhinitis 01/24/2018   Depression with anxiety 01/24/2018   UTI due to Klebsiella species 01/24/2018   Protein-calorie malnutrition, severe ([place]) 01/24/2018   S/P insertion of IVC (inferior vena caval) filter 01/24/2018   Reactive depression    Hypokalemia    Leukocytosis    Essential hypertension    Trauma    Tetraparesis ([facility])    Neuropathic pain    Neurogenic bowel    Neurogenic bladder    Benign essential HTN    Acute blood loss anemia    Central cord syndrome at C6 level of cervical spinal cord ([place]) 11/29/2017   Central cord  syndrome ([place]) 11/29/2017    Harrel Carina, MS, OTR/L 01/12/2021, 11:57 AM  [place] [address] [place], [place], [address] Phone: [phone]   Fax:  [phone]  Name: Sherry [name] MRN: [id number] Date of Birth: November 30, 1934

## 2021-01-12 NOTE — Therapy (Signed)
[place] MAIN [facility] SERVICES [address] [place], [place], [address] Phone: [phone]   Fax:  [phone]  Physical Therapy Treatment  Patient Details  Name: Sherry [name] MRN: [id number] Date of Birth: January 26, 1935 No data recorded  Encounter Date: 01/12/2021   PT End of Session - 01/12/21 1210     Visit Number 117    Number of Visits 136    Date for PT Re-Evaluation 03/16/21    Authorization Type [employer] medicare FOTO performed by PT on eval (7/20), score 12, Progress note on 03/10/2020; PN on 08/20/2020    Authorization Time Period Recert 09/02/7076- 6/75/4492; PN on 0/02/69; Recert 03/22/7586- 32/54/9826; PN on 05/17/8307; Recert 40/76/8088- 02/10/3157    PT Start Time 1145    PT Stop Time 1225    PT Time Calculation (min) 40 min    Activity Tolerance Patient tolerated treatment well;Patient limited by fatigue;Patient limited by pain    Behavior During Therapy [facility] for tasks assessed/performed             Past Medical History:  Diagnosis Date   Acute blood loss anemia    Arthritis    Cancer ([place])    skin   Central cord syndrome at C6 level of cervical spinal cord ([place]) 11/29/2017   Hypertension    Protein-calorie malnutrition, severe ([place]) 01/24/2018   S/P insertion of IVC (inferior vena caval) filter 01/24/2018   Tetraparesis ([place])     Past Surgical History:  Procedure Laterality Date   ANTERIOR CERVICAL DECOMP/DISCECTOMY FUSION N/A 11/29/2017   Procedure: Cervical five-six, six-seven Anterior Cervical Decompression Fusion;  Surgeon: Judith Part, MD;  Location: [place];  Service: Neurosurgery;  Laterality: N/A;  Cervical five-six, six-seven Anterior Cervical Decompression Fusion   CATARACT EXTRACTION     IR IVC FILTER PLMT / S&I /IMG GUID/MOD SED  12/08/2017    There were no vitals filed for this visit.   Subjective Assessment - 01/12/21 1209     Subjective Pt doing well. Left AFO still doffed and being repaired.  WC repair person to address Rt arm controls this week.    Pertinent History Pt is an 86 yo female that fell in 2019, fractured vertebrae in her neck and in her low back per family. 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 After hospital stay, pt discharged to [facility] for ~1 month, experienced severe UTI as well as bilateral DVT (IVC filter placed). Discharged to [facility], stayed for about a year, and then moved to [facility] in April 2021. Was receiving PT, but per family facility reported that did not have adequate equipment to maximize PT for her. Pt until about 1 month ago was practicing sit to stand transfers with 1-2 people, and working on static standing. Has a brace for L foot due to PF. Pt currently needs assistance with all ADLs (able to assist with donning/doffing her shirt), bed baths, and needs a hoyer lift for transfers. Able to drive power wheelchair without assistance.    Currently in Pain? No/denies             INTERVENTION THIS DATE:  *pt able to adjust recline and elevated leg rests to accomodate optimal positions for exercises in session.  *pt mobilizes in clinic modI with power WC, RUE joystick and controls  -AA/ROM "Heel slide" 2x15; AR/ROM leg press 2x15 -Hooklying bridging 1x10 (author stabilizes BLE on calf rests)  -Hooklying marching 1x10 (author stabilization) MaxA for RLE  - LAQ 2x10  bilat unweighted (AA/ROM Rt); 1x15 @ 1.5lbAW; 1x10 @ 3lbAW *distal thigh propped on half foam roller, trunk reclined to avoid hamstrings resistance  -Partial recline sit-up, then trunk extension from forward fold (2x10; chairback at 45 degrees to gravity) -triceps pushdowns 2.5lb 1x15 bilat on cable  -biceps curls 4lb FW RUE x10, 2lbFW LUE to fatigue      PT Education - 01/12/21 1210     Education Details use of ankle weights    Person(s) Educated Patient    Methods Explanation;Demonstration    Comprehension Verbalized understanding;Returned demonstration               PT Short Term Goals - 03/10/20 2211       PT SHORT TERM GOAL #1   Title The patient will perform initial HEP with minimum assistance in order to improve strength and function.    Baseline Patient demonstrating independence with initial HEP as of 03/10/2020 with no questions or difficulty.    Time 4    Period Weeks    Status Achieved    Target Date 02/11/20               PT Long Term Goals - 12/22/20 1155       PT LONG TERM GOAL #1   Title The patient will be compliant with finalized HEP with minimum assistance in preparation for self management and maintenance of condition.    Baseline 03/10/2020- Patient familiar with initial HEP and will keep goal active as HEP becomes more progressive including possible transfers and standing activities. 04/07/2020- Patient continues to report compliance with LE strengthening home exercises without questions or concerns. 04/21/2020- Patient able to verbalize and demonstrate good understanding of current HEP including some supine and seated LE strengthening exercises. Reviewed and patient performed 10 reps today with minimal cueing. Will keep goal active to progress HEP as appropriate. 05/28/2020- patient reports continues to perform LE strengthening as able and some help from caregiver as able. No changes at this time to home program as patient is limited to supine/seated therex. 07/02/2020- Patient continues to report compliance with current supine and seated LE strengtheing home program and states no questions or concerns regarding her plan.    Time 12    Period Weeks    Status Achieved      PT LONG TERM GOAL #2   Title The patient will demonstrate at least 10 point improvement on FOTO score indicating an improved ability to perform functional activities.    Baseline on eval (/720) score 12; 10/22/19: 12; 12/10/19: 18, 12/13: 18: 04/07/2020- Will assess next visit. 07/02/2020- Will obtain next visit. 09/24/2020= Will obtain next visit.  11/12/2020= 12; 12/22/2020= 12    Time 12    Period Weeks    Status On-going    Target Date 03/16/21      PT LONG TERM GOAL #3   Title The patient will demonstrate lateral scooting  for assistance with transfers with minA to maximize independence.    Baseline 10/22/19: Pt requires modA+1 for lateral scooting; 12/10/19: modA+1, 12/13: modA-maxA, 12/27: modA-maxA, 03/10/2020- ModA-MaxA- increased verbal cues and visual demonstration. 04/07/2020- Continued Max assist to perform forward/lateral scoot. 04/21/2020- Patient was able to demo slight lateral scoot with CGA approx 12 in then fatigued requiring max assist- she is able to scoot forward by leaning without significant issues. 05/28/2020- While sitting in chair- patient able to perform forward and lateral scoot with VC/visual demo and increased time allowed- CGA today but not consistent yet- will  keep goal active at this time to continue to focus on strengthening and improving technique. 07/02/2020- Patient able to demonstrate minimal ability to laterally shift weigh on mat table requiring max assist yet is able to forward scoot out to edge of mat table with min Assist. 08/20/2020 - Patient was able to demonstrate minimal lateral scooting at edge of mat while holding feet still so they did not slip on tile surface. She continues to be able to demonstrate scoot forward and uses tilt option to recline to position safely back into seat of power wheelchair. 09/25/2020- Patient able to sit on mat and minimally scoot laterally but requires min/mod A for efficient scooting. Will end goal at this time due to plateau in progress with scooting over extended time.    Time 12    Period Weeks    Status Not Met      PT LONG TERM GOAL #4   Title The patient will demonstrate a squat pivot transfer with minimum assistance to maximize independence and mobility.    Baseline 10/22/19: unable to safely attempt at this time; 12/10/19: unable to safely attempt at this time, 01/14/20:  unable to safety attempt at this time. 03/10/2020- Patient able to perform Stand pivot transfer with Maximal assist. 04/07/2020- Patient continues to require Max assist with sit to stand- will assist SPT next visit. 04/21/2020- Patient presents with Max assist to perform Sit to stand Transfer- unable to pivot or move Left LE well without difficulty. 07/02/2020= Max assist with SPT and minimal ability to pivot. 7/202/2022-Patient able to stand with minimal assist this visit (as good as CGA last visit) and able to move right foot to pivot and performing with moderate assist today. 09/24/2020- Patient able to perform sit to stand with Min assist from lift chair. Patient has demo inconsistency and will keep goal active.  12/17/2020- Goal is currently not appropriate as patient fractured her ankle and currently NWB. 12/22/2020=Goal is currently not appropriate as patient fractured her ankle and currently NWB.    Time 12    Period Weeks    Status On-going    Target Date 03/16/21      PT LONG TERM GOAL #5   Title The patient will demonstrate sitting without UE support for 2-5 minutes at EOB to improve participation and maximize independence with ADLs.    Baseline 10/22/19: Pt able to sit unsupported for at least 2 minutes at edge of bed. 04/21/2020- Patient continues to demo good sitting at edge of mat > 5 miin without difficulty or back support.    Time 12    Period Weeks    Status Achieved      Additional Long Term Goals   Additional Long Term Goals Yes      PT LONG TERM GOAL #6   Title Patient will demonstrate improved functional LE strength as seen by consistent ability to stand > 2 min (3 out of 4 trials) with Mod/Max A for improved LE strength with transfers.    Baseline 05/28/2020- Patient able to inconsistent stand 1-2 min right now in //bars with Mod/Max A. 07/02/2020- Patient was able to progress last 2 visit to standing using bilateral platform attachment between 1-2 min which is a progression from the  parallel bars. 08/20/2020- Patient performed static standing with min- mod A using bilateral platform attachment on walker for 6 min 30 sec today with assist for trunk control and intermittent verbal cues for posture. 09/24/2020- Patient has consistently been able to stand >2 min  in // bars and when using bilateral Platform walker. 12/23/2020= Will reopen this goal as patient should transition back to weightbearing and again practice standing in new cert.    Time 12    Period Weeks    Status Deferred    Target Date 03/16/21      PT LONG TERM GOAL #7   Title Patient/caregiver will demonstrate assisted transfer using [place] Lift consistently Independently for improved ability to transfer at home from lift chair to bed.    Baseline 09/24/2020- Patient requires hoyer lift (Dependent) for safe transfers.  12/17/2020- Patient currently still dependent on hoyer as she is currently non-weight bearing due to recent ankle fracture.    Time 12    Period Weeks    Status On-going    Target Date 03/16/21      PT LONG TERM GOAL #8   Title Patient will demo improved right LE quad strength as seen by ability to extend knee to 20 deg from 0 or less while performing a Long arc quad (for improved terminal knee ext and more full ROM)  for improved ability to bearing weight with Right LE.    Baseline 12/22/2020=able to extend knee to approx 35 deg from zero during long arc quad    Time 12    Period Weeks    Status New    Target Date 03/16/20                   Plan - 01/12/21 1212     Clinical Impression Statement Doing well in general. Still awaiting repairs to DME to resume her most recent program. Continued with strengthening of core this date. Able to progress to to multiple sets on LE exercises, added ankle weights to LAQ left. Pt continues to demonstrate good progress toward goals overall.    Personal Factors and Comorbidities Age;Time since onset of injury/illness/exacerbation;Comorbidity 3+;Fitness     Comorbidities HTN, quadriparesis, history of DVT, neurogenic bladder    Examination-Activity Limitations Bathing;Hygiene/Grooming;Squat;Bed Mobility;Lift;Bend;Stand;Engineer, manufacturing;Toileting;Self Feeding;Transfers;Continence;Sit;Dressing;Sleep;Carry    Examination-Participation Restrictions Church;Laundry;Cleaning;Medication Management;Community Activity;Meal Prep;Interpersonal Relationship    Stability/Clinical Decision Making Evolving/Moderate complexity    Clinical Decision Making Moderate    Rehab Potential Fair    PT Frequency 2x / week    PT Duration 12 weeks    PT Treatment/Interventions ADLs/Self Care Home Management;Electrical Stimulation;Therapeutic activities;Wheelchair mobility training;Vasopneumatic Device;Joint Manipulations;Vestibular;Passive range of motion;Patient/family education;Therapeutic exercise;DME Instruction;Biofeedback;Aquatic Therapy;Moist Heat;Gait training;Balance training;Orthotic Fit/Training;Dry needling;Energy conservation;Taping;Splinting;Neuromuscular re-education;Cryotherapy;Ultrasound;Functional mobility training    PT Next Visit Plan Continue with progressive standing/transfer training once MD approved    PT Home Exercise Plan no changes    Consulted and Agree with Plan of Care Patient             Patient will benefit from skilled therapeutic intervention in order to improve the following deficits and impairments:  Abnormal gait, Decreased balance, Decreased endurance, Decreased mobility, Difficulty walking, Hypomobility, Impaired sensation, Decreased range of motion, Improper body mechanics, Impaired perceived functional ability, Decreased activity tolerance, Decreased knowledge of use of DME, Decreased safety awareness, Decreased strength, Impaired flexibility, Impaired UE functional use, Postural dysfunction  Visit Diagnosis: Muscle weakness (generalized)  Other lack of coordination  Abnormality of gait and mobility  Difficulty  in walking, not elsewhere classified  Unsteadiness on feet  Central cord syndrome, sequela (HCC)  Abdominal distention  Other abnormalities of gait and mobility     Problem List Patient Active Problem List   Diagnosis Date Noted   Acute appendicitis with appendiceal  abscess 05/30/2018   Hypocalcemia 02/15/2018   Dysuria 02/15/2018   Bilateral lower extremity edema 02/11/2018   Vaginal yeast infection 01/30/2018   Chest tightness 01/27/2018   Healthcare-associated pneumonia 01/25/2018   Pleural effusion, not elsewhere classified 01/25/2018   Acute deep vein thrombosis (DVT) of left lower extremity ([place]) 01/24/2018   Acute deep vein thrombosis (DVT) of right lower extremity ([place]) 01/24/2018   Chronic allergic rhinitis 01/24/2018   Depression with anxiety 01/24/2018   UTI due to Klebsiella species 01/24/2018   Protein-calorie malnutrition, severe ([place]) 01/24/2018   S/P insertion of IVC (inferior vena caval) filter 01/24/2018   Reactive depression    Hypokalemia    Leukocytosis    Essential hypertension    Trauma    Tetraparesis (HCC)    Neuropathic pain    Neurogenic bowel    Neurogenic bladder    Benign essential HTN    Acute blood loss anemia    Central cord syndrome at C6 level of cervical spinal cord ([place]) 11/29/2017   Central cord syndrome ([place]) 11/29/2017   12:35 PM, 01/12/21 Etta Grandchild, PT, DPT Physical Therapist - [facility]  [phone])    [place], PT 01/12/2021, 12:33 PM  [place] MAIN [facility] SERVICES [address] [place], [place], [address] Phone: [phone]   Fax:  [phone]  Name: GLENICE CICCONE MRN: [id number] Date of Birth: 1935/01/31

## 2021-01-14 ENCOUNTER — Ambulatory Visit: Payer: Medicare HMO

## 2021-01-14 ENCOUNTER — Ambulatory Visit: Payer: Medicare HMO | Admitting: Occupational Therapy

## 2021-01-14 ENCOUNTER — Other Ambulatory Visit: Payer: Self-pay

## 2021-01-14 ENCOUNTER — Encounter: Payer: Self-pay | Admitting: Occupational Therapy

## 2021-01-14 DIAGNOSIS — F39 Unspecified mood [affective] disorder: Secondary | ICD-10-CM | POA: Diagnosis not present

## 2021-01-14 DIAGNOSIS — M6281 Muscle weakness (generalized): Secondary | ICD-10-CM

## 2021-01-14 DIAGNOSIS — R278 Other lack of coordination: Secondary | ICD-10-CM

## 2021-01-14 NOTE — Therapy (Signed)
[place] MAIN [facility] SERVICES [address] [place], [place], [address] Phone: [phone]   Fax:  [phone]  Occupational Therapy Treatment  Patient Details  Name: Sherry [name] MRN: [id number] Date of Birth: Jul 07, 1934 No data recorded  Encounter Date: 01/14/2021   OT End of Session - 01/14/21 1619     Visit Number 94    Number of Visits 106    Date for OT Re-Evaluation 04/06/21    Authorization Time Period Progress report period starting 11/26/2020    OT Start Time 1100    OT Stop Time 1145    OT Time Calculation (min) 45 min    Activity Tolerance Patient tolerated treatment well    Behavior During Therapy [facility] for tasks assessed/performed             Past Medical History:  Diagnosis Date   Acute blood loss anemia    Arthritis    Cancer ([place])    skin   Central cord syndrome at C6 level of cervical spinal cord ([place]) 11/29/2017   Hypertension    Protein-calorie malnutrition, severe ([place]) 01/24/2018   S/P insertion of IVC (inferior vena caval) filter 01/24/2018   Tetraparesis ([place])     Past Surgical History:  Procedure Laterality Date   ANTERIOR CERVICAL DECOMP/DISCECTOMY FUSION N/A 11/29/2017   Procedure: Cervical five-six, six-seven Anterior Cervical Decompression Fusion;  Surgeon: Judith Part, MD;  Location: [place];  Service: Neurosurgery;  Laterality: N/A;  Cervical five-six, six-seven Anterior Cervical Decompression Fusion   CATARACT EXTRACTION     IR IVC FILTER PLMT / S&I /IMG GUID/MOD SED  12/08/2017    There were no vitals filed for this visit.   Subjective Assessment - 01/14/21 1618     Subjective  Pt. reports having had a laser procedure this past week, and has noticed improvements.    Patient is accompanied by: Family member    Pertinent History Patient s/p fall November 29, 2017 resulting in diagnosis of central cord syndrome at C 6 level.  She has had therapy in multiple venues but with recent move  to [facility], therapy staff recommended she seek outpatient therapy for her needs.    Currently in Pain? No/denies            OT TREATMENT     Pt. tolerated bilateral shoulder flexion, extension, abduction, elbow flexion, extension forearm supination AAROM/AROM. PROM bilateral wrist extension, digit MP, PIP, and DIP flexion, and extension, thumb radial, and palmar abduction in conjunction with moist heat. Pt. worked on [employer], and reciprocal motion using the UBE while sitting for 8 min. with minimal resistance.   Pt. continues to make progress, and is improving with reaching further with her BUEs in preparation for improving overhead ADLs, and haircare. Pt. was able to tolerate the UBE for 8 min. with the UBE elevated for increased ROM. Pt. continues to be able to reach further to assist with performing haircare, and continues to use her hands for self-feeding, holding utensils, and writing. Pt. tolerated ROM well without reports of pain, or discomfort. Pt. continues to present with bilateral wrist flexor tightness, and digit extensor tightness. Pt. continues to benefit from working on impoving ROM in order to work towards increasing engagement of her bilateral hands during ADLs, and IADL tasks.                           OT Education - 01/14/21 1619  Education Details BUE stretching for preventing worsening joint contractures    Person(s) Educated Patient;Caregiver(s)    Methods Explanation;Demonstration;Verbal cues    Comprehension Verbalized understanding;Need further instruction;Verbal cues required                 OT Long Term Goals - 01/12/21 1201       OT LONG TERM GOAL #1   Title Patient and caregiver will demonstrate understanding of home exercise program for ROM.    Baseline Pt.'s current restorative aide is retiring at the end of December. Pt. continues to have a restorative aide rehab aide assist her wih exercises at [facility]. 8/8:  Independently completes/directs HEP mulitple times/week    Time 12    Period Weeks    Status Partially Met    Target Date 04/06/21      OT LONG TERM GOAL #2   Title Patient will demonstrate ability to don shirt with min assist from seated position.    Baseline Pt. is indepedent with a larger shirt, and continues to require increased time to complete,    Time 12    Period Weeks    Status Achieved      OT LONG TERM GOAL #3   Title Patient will demonstrate improved sitting balance at the edge of the bed to participate in self care tasks.    Baseline Pt. has an air mattress at [facility]    Time 12    Period Weeks    Status Deferred      OT LONG TERM GOAL #4   Title Patient will demonstrate improved composite finger flexion to hold deodorant to apply to underarms.    Baseline 01/12/2021:  Pt. is progressing, and is able to reach to grasp, and hold narrow cones. 10/20/2020: Pt. conitnues to make steady progress with bilateral digit flexion.Pt. continues to present with stiifness/tightness which is hindering pt.'s ability to formulate a full composite fist. Pt. continues to present with improving finger flexion of R hand for a partial gross grasp, but unable to grasp and hold items. pt. conitnues to present with limited digit flexion of L hand. Pt. has active left thumb abduction. 8/8: holds cup with 2 hands, continues to have limited composite finger flexion L worse than R.    Time 12    Period Weeks    Status On-going    Target Date 04/06/21      OT LONG TERM GOAL #5   Title Pt will complete self feeding with setup, use of universal cuff and minimal spillage.    Baseline inconsistent with feeding and increased spillage. Pt is able to use a fork without spillage. Pt. has spillage with a spoon.    Time 12    Period Weeks    Status Achieved    Target Date 12/10/19      OT LONG TERM GOAL #6   Title Patient will increase right UE ROM to comb the back of her hair with modified independence.     Baseline 01/12/2022: Pt. is able to comb her hair, reaching to the back of her hair.. 10/20/2020: Pt. continues to progress wtith RRUE ROM, and is able to use a pic for the back of her head, however is unable to brush the back of her hair. 07/30/2020: Pt. is now able to comb the back of her hair with a pic, however continues to work on completing the task efficiently. 8/8: Achieves with great effort, has assistance for efficiancy    Time  12    Period Weeks    Status Partially Met    Target Date 04/06/21      OT LONG TERM GOAL #7   Title Pt. will independently, and efficiently send text messages on her phone.    Baseline 01/12/2022:: pt. is now able to type text messages on her phone. 10/20/2020: Pt. is now able to type a text message on the phone, however not efficiently taking increased time to complete. 07/30/2020: Pt. continues to present with limited BUE strength, and [facility] skills. 8/8: independtly drafts text messages, continues to work on accuracy and efficiancy 2/2 limited [facility]    Time 12    Period Weeks    Status Partially Met    Target Date 04/06/21      OT LONG TERM GOAL #8   Title Pt. will write, and sign her name with 100% legibility, and modified independence.    Baseline 01/12/2021: Pt. continnues to fill out her daily menu and complete puzzles. 10/20/2020: Pt. continues to make progress overall. Pt. continues to consistently fill out her daily menu, and writing for crossword puzzles. Pt. is able to maintain grasp on a wide width pen. [place] to work on [employer]. 8/8: writes daily, continues to work on improving speed    Time 12    Period Weeks    Status Partially Met    Target Date 04/06/21                   Plan - 01/14/21 1619     Clinical Impression Statement Pt. continues to make progress, and is improving with reaching further with her BUEs in preparation for improving overhead ADLs, and haircare. Pt. was able to tolerate the UBE for 8 min. with the UBE  elevated for increased ROM. Pt. continues to be able to reach further to assist with performing haircare, and continues to use her hands for self-feeding, holding utensils, and writing. Pt. tolerated ROM well without reports of pain, or discomfort. Pt. continues to present with bilateral wrist flexor tightness, and digit extensor tightness. Pt. continues to benefit from working on impoving ROM in order to work towards increasing engagement of her bilateral hands during ADLs, and IADL tasks.      OT Occupational Profile and History Detailed Assessment- Review of Records and additional review of physical, cognitive, psychosocial history related to current functional performance    Occupational performance deficits (Please refer to evaluation for details): ADL's;IADL's;Leisure;Social Participation    Body Structure / Function / Physical Skills ADL;Continence;Dexterity;Flexibility;Strength;ROM;Balance;Coordination;FMC;IADL;Endurance;UE functional use;Decreased knowledge of use of DME;GMC    Psychosocial Skills Coping Strategies    Rehab Potential Fair    Clinical Decision Making Several treatment options, min-mod task modification necessary    Comorbidities Affecting Occupational Performance: Presence of comorbidities impacting occupational performance    Comorbidities impacting occupational performance description: contractures of bilateral hands, dependent transfers,    Modification or Assistance to Complete Evaluation  No modification of tasks or assist necessary to complete eval    OT Frequency 2x / week    OT Duration 12 weeks    OT Treatment/Interventions Self-care/ADL training;Cryotherapy;Therapeutic exercise;DME and/or AE instruction;Balance training;Neuromuscular education;Manual Therapy;Splinting;Moist Heat;Passive range of motion;Therapeutic activities;Patient/family education    Consulted and Agree with Plan of Care Patient             Patient will benefit from skilled therapeutic  intervention in order to improve the following deficits and impairments:   Body Structure / Function / Physical Skills: ADL, Continence,  Dexterity, Flexibility, Strength, ROM, Balance, Coordination, FMC, IADL, Endurance, UE functional use, Decreased knowledge of use of DME, GMC   Psychosocial Skills: Coping Strategies   Visit Diagnosis: Muscle weakness (generalized)  Other lack of coordination    Problem List Patient Active Problem List   Diagnosis Date Noted   Acute appendicitis with appendiceal abscess 05/30/2018   Hypocalcemia 02/15/2018   Dysuria 02/15/2018   Bilateral lower extremity edema 02/11/2018   Vaginal yeast infection 01/30/2018   Chest tightness 01/27/2018   Healthcare-associated pneumonia 01/25/2018   Pleural effusion, not elsewhere classified 01/25/2018   Acute deep vein thrombosis (DVT) of left lower extremity ([place]) 01/24/2018   Acute deep vein thrombosis (DVT) of right lower extremity ([place]) 01/24/2018   Chronic allergic rhinitis 01/24/2018   Depression with anxiety 01/24/2018   UTI due to Klebsiella species 01/24/2018   Protein-calorie malnutrition, severe ([place]) 01/24/2018   S/P insertion of IVC (inferior vena caval) filter 01/24/2018   Reactive depression    Hypokalemia    Leukocytosis    Essential hypertension    Trauma    Tetraparesis ([facility])    Neuropathic pain    Neurogenic bowel    Neurogenic bladder    Benign essential HTN    Acute blood loss anemia    Central cord syndrome at C6 level of cervical spinal cord ([place]) 11/29/2017   Central cord syndrome ([place]) 11/29/2017    Harrel Carina, MS, OTR/L 01/14/2021, 4:22 PM  [place] MAIN [facility] SERVICES [address] [place], [place], [address] Phone: [phone]   Fax:  [phone]  Name: Sherry [name] MRN: [id number] Date of Birth: 01/31/1935

## 2021-01-19 ENCOUNTER — Ambulatory Visit: Payer: Medicare HMO | Admitting: Occupational Therapy

## 2021-01-19 ENCOUNTER — Encounter: Payer: Self-pay | Admitting: Occupational Therapy

## 2021-01-19 ENCOUNTER — Other Ambulatory Visit: Payer: Self-pay

## 2021-01-19 ENCOUNTER — Ambulatory Visit: Payer: Medicare HMO

## 2021-01-19 DIAGNOSIS — M6281 Muscle weakness (generalized): Secondary | ICD-10-CM

## 2021-01-19 DIAGNOSIS — R2681 Unsteadiness on feet: Secondary | ICD-10-CM

## 2021-01-19 DIAGNOSIS — R262 Difficulty in walking, not elsewhere classified: Secondary | ICD-10-CM

## 2021-01-19 DIAGNOSIS — R269 Unspecified abnormalities of gait and mobility: Secondary | ICD-10-CM

## 2021-01-19 DIAGNOSIS — R278 Other lack of coordination: Secondary | ICD-10-CM

## 2021-01-19 NOTE — Therapy (Signed)
[place] MAIN [facility] SERVICES [address] [place], [place], [address] Phone: [phone]   Fax:  [phone]  Physical Therapy Treatment  Patient Details  Name: Sherry [name] MRN: [id number] Date of Birth: 12/25/1934 No data recorded  Encounter Date: 01/19/2021   PT End of Session - 01/19/21 1153     Visit Number 118    Number of Visits 136    Date for PT Re-Evaluation 03/16/21    Authorization Type a[employer] medicare FOTO performed by PT on eval (7/20), score 12, Progress note on 03/10/2020; PN on 08/20/2020    Authorization Time Period Recert 08/05/1698- 1/74/9449; PN on 07/09/5914; Recert 3/84/6659- 93/57/0177; PN on 9/39/0300; Recert 92/33/0076- 03/29/3333    PT Start Time 1145    PT Stop Time 1226    PT Time Calculation (min) 41 min    Activity Tolerance Patient tolerated treatment well;Patient limited by fatigue;Patient limited by pain    Behavior During Therapy [facility] for tasks assessed/performed             Past Medical History:  Diagnosis Date   Acute blood loss anemia    Arthritis    Cancer ([place])    skin   Central cord syndrome at C6 level of cervical spinal cord ([place]) 11/29/2017   Hypertension    Protein-calorie malnutrition, severe ([place]) 01/24/2018   S/P insertion of IVC (inferior vena caval) filter 01/24/2018   Tetraparesis ([place])     Past Surgical History:  Procedure Laterality Date   ANTERIOR CERVICAL DECOMP/DISCECTOMY FUSION N/A 11/29/2017   Procedure: Cervical five-six, six-seven Anterior Cervical Decompression Fusion;  Surgeon: Judith Part, MD;  Location: [place];  Service: Neurosurgery;  Laterality: N/A;  Cervical five-six, six-seven Anterior Cervical Decompression Fusion   CATARACT EXTRACTION     IR IVC FILTER PLMT / S&I /IMG GUID/MOD SED  12/08/2017    There were no vitals filed for this visit.   Subjective Assessment - 01/19/21 1102     Subjective Patient reports no changes since last visit. Reports brace  is not back yet.    Pertinent History Pt is an 86 yo female that fell in 2019, fractured vertebrae in her neck and in her low back per family. 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 After hospital stay, pt discharged to [facility] for ~1 month, experienced severe UTI as well as bilateral DVT (IVC filter placed). Discharged to [facility], stayed for about a year, and then moved to [facility] in April 2021. Was receiving PT, but per family facility reported that did not have adequate equipment to maximize PT for her. Pt until about 1 month ago was practicing sit to stand transfers with 1-2 people, and working on static standing. Has a brace for L foot due to PF. Pt currently needs assistance with all ADLs (able to assist with donning/doffing her shirt), bed baths, and needs a hoyer lift for transfers. Able to drive power wheelchair without assistance.    Currently in Pain? No/denies             INTERVENTIONS:     Therapeutic exercises: Reclined exercises:  -B Heel slide 2 sets of 15 reps with AAROM -Manual resistance leg press  2 sets x 15 reps ( -Hip abduction/add with min physical resistance B LE 2 sets of 15 reps -SLR- BLE 2 sets of 12 reps.  -Gluteal Sets - hold 5 sec x 12 reps -Quad sets with 5 sec hold BLE- VC to hold right  LE for 5 sec     -Seated exercises:   - Bilateral Hip march with AAROM x 15 reps - Left knee ext with 2.5 lb 2 sets x 15 reps - Right Knee ext- with AAROM then slow eccentric control 2 sets of 12 reps.   Education provided throughout session via VC/TC and demonstration to facilitate movement at target joints and correct muscle activation for all testing and exercises performed.                         PT Education - 01/19/21 1150     Education Details Exercise techniques    Person(s) Educated Patient    Methods Explanation;Demonstration;Tactile cues;Verbal cues    Comprehension Verbalized understanding;Returned  demonstration;Verbal cues required;Tactile cues required;Need further instruction              PT Short Term Goals - 03/10/20 2211       PT SHORT TERM GOAL #1   Title The patient will perform initial HEP with minimum assistance in order to improve strength and function.    Baseline Patient demonstrating independence with initial HEP as of 03/10/2020 with no questions or difficulty.    Time 4    Period Weeks    Status Achieved    Target Date 02/11/20               PT Long Term Goals - 12/22/20 1155       PT LONG TERM GOAL #1   Title The patient will be compliant with finalized HEP with minimum assistance in preparation for self management and maintenance of condition.    Baseline 03/10/2020- Patient familiar with initial HEP and will keep goal active as HEP becomes more progressive including possible transfers and standing activities. 04/07/2020- Patient continues to report compliance with LE strengthening home exercises without questions or concerns. 04/21/2020- Patient able to verbalize and demonstrate good understanding of current HEP including some supine and seated LE strengthening exercises. Reviewed and patient performed 10 reps today with minimal cueing. Will keep goal active to progress HEP as appropriate. 05/28/2020- patient reports continues to perform LE strengthening as able and some help from caregiver as able. No changes at this time to home program as patient is limited to supine/seated therex. 07/02/2020- Patient continues to report compliance with current supine and seated LE strengtheing home program and states no questions or concerns regarding her plan.    Time 12    Period Weeks    Status Achieved      PT LONG TERM GOAL #2   Title The patient will demonstrate at least 10 point improvement on FOTO score indicating an improved ability to perform functional activities.    Baseline on eval (/720) score 12; 10/22/19: 12; 12/10/19: 18, 12/13: 18: 04/07/2020- Will assess  next visit. 07/02/2020- Will obtain next visit. 09/24/2020= Will obtain next visit. 11/12/2020= 12; 12/22/2020= 12    Time 12    Period Weeks    Status On-going    Target Date 03/16/21      PT LONG TERM GOAL #3   Title The patient will demonstrate lateral scooting  for assistance with transfers with minA to maximize independence.    Baseline 10/22/19: Pt requires modA+1 for lateral scooting; 12/10/19: modA+1, 12/13: modA-maxA, 12/27: modA-maxA, 03/10/2020- ModA-MaxA- increased verbal cues and visual demonstration. 04/07/2020- Continued Max assist to perform forward/lateral scoot. 04/21/2020- Patient was able to demo slight lateral scoot with CGA approx 12 in then fatigued  requiring max assist- she is able to scoot forward by leaning without significant issues. 05/28/2020- While sitting in chair- patient able to perform forward and lateral scoot with VC/visual demo and increased time allowed- CGA today but not consistent yet- will keep goal active at this time to continue to focus on strengthening and improving technique. 07/02/2020- Patient able to demonstrate minimal ability to laterally shift weigh on mat table requiring max assist yet is able to forward scoot out to edge of mat table with min Assist. 08/20/2020 - Patient was able to demonstrate minimal lateral scooting at edge of mat while holding feet still so they did not slip on tile surface. She continues to be able to demonstrate scoot forward and uses tilt option to recline to position safely back into seat of power wheelchair. 09/25/2020- Patient able to sit on mat and minimally scoot laterally but requires min/mod A for efficient scooting. Will end goal at this time due to plateau in progress with scooting over extended time.    Time 12    Period Weeks    Status Not Met      PT LONG TERM GOAL #4   Title The patient will demonstrate a squat pivot transfer with minimum assistance to maximize independence and mobility.    Baseline 10/22/19: unable to safely  attempt at this time; 12/10/19: unable to safely attempt at this time, 01/14/20: unable to safety attempt at this time. 03/10/2020- Patient able to perform Stand pivot transfer with Maximal assist. 04/07/2020- Patient continues to require Max assist with sit to stand- will assist SPT next visit. 04/21/2020- Patient presents with Max assist to perform Sit to stand Transfer- unable to pivot or move Left LE well without difficulty. 07/02/2020= Max assist with SPT and minimal ability to pivot. 7/202/2022-Patient able to stand with minimal assist this visit (as good as CGA last visit) and able to move right foot to pivot and performing with moderate assist today. 09/24/2020- Patient able to perform sit to stand with Min assist from lift chair. Patient has demo inconsistency and will keep goal active.  12/17/2020- Goal is currently not appropriate as patient fractured her ankle and currently NWB. 12/22/2020=Goal is currently not appropriate as patient fractured her ankle and currently NWB.    Time 12    Period Weeks    Status On-going    Target Date 03/16/21      PT LONG TERM GOAL #5   Title The patient will demonstrate sitting without UE support for 2-5 minutes at EOB to improve participation and maximize independence with ADLs.    Baseline 10/22/19: Pt able to sit unsupported for at least 2 minutes at edge of bed. 04/21/2020- Patient continues to demo good sitting at edge of mat > 5 miin without difficulty or back support.    Time 12    Period Weeks    Status Achieved      Additional Long Term Goals   Additional Long Term Goals Yes      PT LONG TERM GOAL #6   Title Patient will demonstrate improved functional LE strength as seen by consistent ability to stand > 2 min (3 out of 4 trials) with Mod/Max A for improved LE strength with transfers.    Baseline 05/28/2020- Patient able to inconsistent stand 1-2 min right now in //bars with Mod/Max A. 07/02/2020- Patient was able to progress last 2 visit to standing using  bilateral platform attachment between 1-2 min which is a progression from the parallel bars. 08/20/2020-  Patient performed static standing with min- mod A using bilateral platform attachment on walker for 6 min 30 sec today with assist for trunk control and intermittent verbal cues for posture. 09/24/2020- Patient has consistently been able to stand >2 min in // bars and when using bilateral Platform walker. 12/23/2020= Will reopen this goal as patient should transition back to weightbearing and again practice standing in new cert.    Time 12    Period Weeks    Status Deferred    Target Date 03/16/21      PT LONG TERM GOAL #7   Title Patient/caregiver will demonstrate assisted transfer using [place] Lift consistently Independently for improved ability to transfer at home from lift chair to bed.    Baseline 09/24/2020- Patient requires hoyer lift (Dependent) for safe transfers.  12/17/2020- Patient currently still dependent on hoyer as she is currently non-weight bearing due to recent ankle fracture.    Time 12    Period Weeks    Status On-going    Target Date 03/16/21      PT LONG TERM GOAL #8   Title Patient will demo improved right LE quad strength as seen by ability to extend knee to 20 deg from 0 or less while performing a Long arc quad (for improved terminal knee ext and more full ROM)  for improved ability to bearing weight with Right LE.    Baseline 12/22/2020=able to extend knee to approx 35 deg from zero during long arc quad    Time 12    Period Weeks    Status New    Target Date 03/16/20                   Plan - 01/19/21 1154     Clinical Impression Statement Patient presents with no brace again today (in repair shop) and agreeable to continue with strengthening of core/LE. She responded well to interventions- able to add some resistance with Left LE and able to complete 2 rounds of exercises without significant fatigue. She was responsive to eccentric control of right LE  better with knee ext today. Pt will benefit from skilled physical therapy to increase mobility and strength to improve patient's quality of life    Personal Factors and Comorbidities Age;Time since onset of injury/illness/exacerbation;Comorbidity 3+;Fitness    Comorbidities HTN, quadriparesis, history of DVT, neurogenic bladder    Examination-Activity Limitations Bathing;Hygiene/Grooming;Squat;Bed Mobility;Lift;Bend;Stand;Engineer, manufacturing;Toileting;Self Feeding;Transfers;Continence;Sit;Dressing;Sleep;Carry    Examination-Participation Restrictions Church;Laundry;Cleaning;Medication Management;Community Activity;Meal Prep;Interpersonal Relationship    Stability/Clinical Decision Making Evolving/Moderate complexity    Rehab Potential Fair    PT Frequency 2x / week    PT Duration 12 weeks    PT Treatment/Interventions ADLs/Self Care Home Management;Electrical Stimulation;Therapeutic activities;Wheelchair mobility training;Vasopneumatic Device;Joint Manipulations;Vestibular;Passive range of motion;Patient/family education;Therapeutic exercise;DME Instruction;Biofeedback;Aquatic Therapy;Moist Heat;Gait training;Balance training;Orthotic Fit/Training;Dry needling;Energy conservation;Taping;Splinting;Neuromuscular re-education;Cryotherapy;Ultrasound;Functional mobility training    PT Next Visit Plan Continue with progressive standing/transfer training once MD approved    PT Home Exercise Plan no changes    Consulted and Agree with Plan of Care Patient             Patient will benefit from skilled therapeutic intervention in order to improve the following deficits and impairments:  Abnormal gait, Decreased balance, Decreased endurance, Decreased mobility, Difficulty walking, Hypomobility, Impaired sensation, Decreased range of motion, Improper body mechanics, Impaired perceived functional ability, Decreased activity tolerance, Decreased knowledge of use of DME, Decreased safety awareness,  Decreased strength, Impaired flexibility, Impaired UE functional use, Postural dysfunction  Visit Diagnosis: Abnormality  of gait and mobility  Difficulty in walking, not elsewhere classified  Muscle weakness (generalized)  Unsteadiness on feet     Problem List Patient Active Problem List   Diagnosis Date Noted   Acute appendicitis with appendiceal abscess 05/30/2018   Hypocalcemia 02/15/2018   Dysuria 02/15/2018   Bilateral lower extremity edema 02/11/2018   Vaginal yeast infection 01/30/2018   Chest tightness 01/27/2018   Healthcare-associated pneumonia 01/25/2018   Pleural effusion, not elsewhere classified 01/25/2018   Acute deep vein thrombosis (DVT) of left lower extremity ([place]) 01/24/2018   Acute deep vein thrombosis (DVT) of right lower extremity ([place]) 01/24/2018   Chronic allergic rhinitis 01/24/2018   Depression with anxiety 01/24/2018   UTI due to Klebsiella species 01/24/2018   Protein-calorie malnutrition, severe ([place]) 01/24/2018   S/P insertion of IVC (inferior vena caval) filter 01/24/2018   Reactive depression    Hypokalemia    Leukocytosis    Essential hypertension    Trauma    Tetraparesis ([facility])    Neuropathic pain    Neurogenic bowel    Neurogenic bladder    Benign essential HTN    Acute blood loss anemia    Central cord syndrome at C6 level of cervical spinal cord ([place]) 11/29/2017   Central cord syndrome ([place]) 11/29/2017    Lewis Moccasin, PT 01/19/2021, 5:04 PM  [place] MAIN [facility] SERVICES [place], [place], [address] Phone: [phone]   Fax:  [phone]  Name: Sherry [name] MRN: [id number] Date of Birth: 29-Dec-1934

## 2021-01-19 NOTE — Therapy (Signed)
[place] MAIN [facility] SERVICES [address] [place], [place], [address] Phone: [phone]   Fax:  [phone]  Occupational Therapy Treatment  Patient Details  Name: LONETA TAMPLIN MRN: [id number] Date of Birth: September 21, 1934 No data recorded  Encounter Date: 01/19/2021   OT End of Session - 01/19/21 1221     Visit Number 95    Number of Visits 130    Date for OT Re-Evaluation 04/06/21    Authorization Time Period Progress report period starting 11/26/2020    OT Start Time 1100    OT Stop Time 1145    OT Time Calculation (min) 45 min    Activity Tolerance Patient tolerated treatment well    Behavior During Therapy [facility] for tasks assessed/performed             Past Medical History:  Diagnosis Date   Acute blood loss anemia    Arthritis    Cancer ([place])    skin   Central cord syndrome at C6 level of cervical spinal cord ([place]) 11/29/2017   Hypertension    Protein-calorie malnutrition, severe ([place]) 01/24/2018   S/P insertion of IVC (inferior vena caval) filter 01/24/2018   Tetraparesis ([place])     Past Surgical History:  Procedure Laterality Date   ANTERIOR CERVICAL DECOMP/DISCECTOMY FUSION N/A 11/29/2017   Procedure: Cervical five-six, six-seven Anterior Cervical Decompression Fusion;  Surgeon: Judith Part, MD;  Location: [place];  Service: Neurosurgery;  Laterality: N/A;  Cervical five-six, six-seven Anterior Cervical Decompression Fusion   CATARACT EXTRACTION     IR IVC FILTER PLMT / S&I /IMG GUID/MOD SED  12/08/2017    There were no vitals filed for this visit.   Subjective Assessment - 01/19/21 1220     Subjective  Pt. reports doing well today.    Patient is accompanied by: Family member    Pertinent History Patient s/p fall November 29, 2017 resulting in diagnosis of central cord syndrome at C 6 level.  She has had therapy in multiple venues but with recent move to [facility], therapy staff recommended she seek  outpatient therapy for her needs.    Currently in Pain? No/denies            OT TREATMENT     Pt. tolerated bilateral shoulder flexion, extension, abduction, elbow flexion, extension forearm supination AAROM/AROM. PROM bilateral wrist extension, digit MP, PIP, and DIP flexion, and extension, thumb radial, and palmar abduction in conjunction with moist heat. Pt. worked on [employer], and reciprocal motion using the UBE while sitting for 8 min. with minimal resistance.   Pt. continues to make excellent progress, and is improving with reaching further with her BUEs in preparation for improving overhead ADLs, and haircare. Pt. was able to tolerate the UBE for 8 min. with the UBE elevated for increased ROM. Pt. continues to be able to reach further to assist with performing haircare, and continues to use her hands for self-feeding, holding utensils, and writing. Pt. tolerated ROM well without reports of pain, or discomfort. Pt. continues to present with bilateral wrist flexor tightness, and digit extensor tightness. Pt. continues to benefit from working on impoving ROM in order to work towards increasing engagement of her bilateral hands during ADLs, and IADL tasks.                       OT Education - 01/19/21 1221     Education Details BUE stretching for preventing worsening joint contractures  Person(s) Educated Patient;Caregiver(s)    Methods Explanation;Demonstration;Verbal cues    Comprehension Verbalized understanding;Need further instruction;Verbal cues required                 OT Long Term Goals - 01/12/21 1201       OT LONG TERM GOAL #1   Title Patient and caregiver will demonstrate understanding of home exercise program for ROM.    Baseline Pt.'s current restorative aide is retiring at the end of December. Pt. continues to have a restorative aide rehab aide assist her wih exercises at [facility]. 8/8: Independently completes/directs HEP mulitple  times/week    Time 12    Period Weeks    Status Partially Met    Target Date 04/06/21      OT LONG TERM GOAL #2   Title Patient will demonstrate ability to don shirt with min assist from seated position.    Baseline Pt. is indepedent with a larger shirt, and continues to require increased time to complete,    Time 12    Period Weeks    Status Achieved      OT LONG TERM GOAL #3   Title Patient will demonstrate improved sitting balance at the edge of the bed to participate in self care tasks.    Baseline Pt. has an air mattress at [facility]    Time 12    Period Weeks    Status Deferred      OT LONG TERM GOAL #4   Title Patient will demonstrate improved composite finger flexion to hold deodorant to apply to underarms.    Baseline 01/12/2021:  Pt. is progressing, and is able to reach to grasp, and hold narrow cones. 10/20/2020: Pt. conitnues to make steady progress with bilateral digit flexion.Pt. continues to present with stiifness/tightness which is hindering pt.'s ability to formulate a full composite fist. Pt. continues to present with improving finger flexion of R hand for a partial gross grasp, but unable to grasp and hold items. pt. conitnues to present with limited digit flexion of L hand. Pt. has active left thumb abduction. 8/8: holds cup with 2 hands, continues to have limited composite finger flexion L worse than R.    Time 12    Period Weeks    Status On-going    Target Date 04/06/21      OT LONG TERM GOAL #5   Title Pt will complete self feeding with setup, use of universal cuff and minimal spillage.    Baseline inconsistent with feeding and increased spillage. Pt is able to use a fork without spillage. Pt. has spillage with a spoon.    Time 12    Period Weeks    Status Achieved    Target Date 12/10/19      OT LONG TERM GOAL #6   Title Patient will increase right UE ROM to comb the back of her hair with modified independence.    Baseline 01/12/2022: Pt. is able to comb  her hair, reaching to the back of her hair.. 10/20/2020: Pt. continues to progress wtith RRUE ROM, and is able to use a pic for the back of her head, however is unable to brush the back of her hair. 07/30/2020: Pt. is now able to comb the back of her hair with a pic, however continues to work on completing the task efficiently. 8/8: Achieves with great effort, has assistance for efficiancy    Time 12    Period Weeks    Status Partially Met  Target Date 04/06/21      OT LONG TERM GOAL #7   Title Pt. will independently, and efficiently send text messages on her phone.    Baseline 01/12/2022:: pt. is now able to type text messages on her phone. 10/20/2020: Pt. is now able to type a text message on the phone, however not efficiently taking increased time to complete. 07/30/2020: Pt. continues to present with limited BUE strength, and [facility] skills. 8/8: independtly drafts text messages, continues to work on accuracy and efficiancy 2/2 limited [facility]    Time 12    Period Weeks    Status Partially Met    Target Date 04/06/21      OT LONG TERM GOAL #8   Title Pt. will write, and sign her name with 100% legibility, and modified independence.    Baseline 01/12/2021: Pt. continnues to fill out her daily menu and complete puzzles. 10/20/2020: Pt. continues to make progress overall. Pt. continues to consistently fill out her daily menu, and writing for crossword puzzles. Pt. is able to maintain grasp on a wide width pen. [place] to work on [employer]. 8/8: writes daily, continues to work on improving speed    Time 12    Period Weeks    Status Partially Met    Target Date 04/06/21                   Plan - 01/19/21 1221     Clinical Impression Statement Pt. continues to make excellent progress, and is improving with reaching further with her BUEs in preparation for improving overhead ADLs, and haircare. Pt. was able to tolerate the UBE for 8 min. with the UBE elevated for increased ROM. Pt.  continues to be able to reach further to assist with performing haircare, and continues to use her hands for self-feeding, holding utensils, and writing. Pt. tolerated ROM well without reports of pain, or discomfort. Pt. continues to present with bilateral wrist flexor tightness, and digit extensor tightness. Pt. continues to benefit from working on impoving ROM in order to work towards increasing engagement of her bilateral hands during ADLs, and IADL tasks.     OT Occupational Profile and History Detailed Assessment- Review of Records and additional review of physical, cognitive, psychosocial history related to current functional performance    Occupational performance deficits (Please refer to evaluation for details): ADL's;IADL's;Leisure;Social Participation    Body Structure / Function / Physical Skills ADL;Continence;Dexterity;Flexibility;Strength;ROM;Balance;Coordination;FMC;IADL;Endurance;UE functional use;Decreased knowledge of use of DME;GMC    Psychosocial Skills Coping Strategies    Rehab Potential Fair    Clinical Decision Making Several treatment options, min-mod task modification necessary    Comorbidities Affecting Occupational Performance: Presence of comorbidities impacting occupational performance    Comorbidities impacting occupational performance description: contractures of bilateral hands, dependent transfers,    Modification or Assistance to Complete Evaluation  No modification of tasks or assist necessary to complete eval    OT Frequency 2x / week    OT Duration 12 weeks    OT Treatment/Interventions Self-care/ADL training;Cryotherapy;Therapeutic exercise;DME and/or AE instruction;Balance training;Neuromuscular education;Manual Therapy;Splinting;Moist Heat;Passive range of motion;Therapeutic activities;Patient/family education    Consulted and Agree with Plan of Care Patient             Patient will benefit from skilled therapeutic intervention in order to improve the  following deficits and impairments:   Body Structure / Function / Physical Skills: ADL, Continence, Dexterity, Flexibility, Strength, ROM, Balance, Coordination, FMC, IADL, Endurance, UE functional use, Decreased knowledge of  use of DME, [place]   Psychosocial Skills: Coping Strategies   Visit Diagnosis: Muscle weakness (generalized)  Other lack of coordination    Problem List Patient Active Problem List   Diagnosis Date Noted   Acute appendicitis with appendiceal abscess 05/30/2018   Hypocalcemia 02/15/2018   Dysuria 02/15/2018   Bilateral lower extremity edema 02/11/2018   Vaginal yeast infection 01/30/2018   Chest tightness 01/27/2018   Healthcare-associated pneumonia 01/25/2018   Pleural effusion, not elsewhere classified 01/25/2018   Acute deep vein thrombosis (DVT) of left lower extremity ([place]) 01/24/2018   Acute deep vein thrombosis (DVT) of right lower extremity ([place]) 01/24/2018   Chronic allergic rhinitis 01/24/2018   Depression with anxiety 01/24/2018   UTI due to Klebsiella species 01/24/2018   Protein-calorie malnutrition, severe ([place]) 01/24/2018   S/P insertion of IVC (inferior vena caval) filter 01/24/2018   Reactive depression    Hypokalemia    Leukocytosis    Essential hypertension    Trauma    Tetraparesis ([facility])    Neuropathic pain    Neurogenic bowel    Neurogenic bladder    Benign essential HTN    Acute blood loss anemia    Central cord syndrome at C6 level of cervical spinal cord ([place]) 11/29/2017   Central cord syndrome ([place]) 11/29/2017    Harrel Carina, MS, OTR/L 01/19/2021, 12:26 PM  [place] MAIN [facility] SERVICES [address] [place], [place], [address] Phone: [phone]   Fax:  [phone]  Name: MELAINA HOWERTON MRN: [id number] Date of Birth: 1934-12-05

## 2021-01-21 ENCOUNTER — Ambulatory Visit: Payer: Medicare HMO | Admitting: Occupational Therapy

## 2021-01-21 ENCOUNTER — Other Ambulatory Visit: Payer: Self-pay

## 2021-01-21 ENCOUNTER — Ambulatory Visit: Payer: Medicare HMO

## 2021-01-21 DIAGNOSIS — R278 Other lack of coordination: Secondary | ICD-10-CM

## 2021-01-21 DIAGNOSIS — M6281 Muscle weakness (generalized): Secondary | ICD-10-CM | POA: Diagnosis not present

## 2021-01-21 DIAGNOSIS — R262 Difficulty in walking, not elsewhere classified: Secondary | ICD-10-CM

## 2021-01-21 DIAGNOSIS — R2681 Unsteadiness on feet: Secondary | ICD-10-CM

## 2021-01-21 DIAGNOSIS — R269 Unspecified abnormalities of gait and mobility: Secondary | ICD-10-CM

## 2021-01-21 NOTE — Therapy (Signed)
[place] MAIN [facility] SERVICES [address] [place], [place], [address] Phone: [phone]   Fax:  [phone]  Physical Therapy Treatment  Patient Details  Name: Sherry [name] MRN: [id number] Date of Birth: [birth year] No data recorded  Encounter Date: 01/21/2021   PT End of Session - 01/21/21 1157     Visit Number 119    Number of Visits 136    Date for PT Re-Evaluation 03/16/21    Authorization Type [employer]are FOTO performed by PT on eval (7/20), score 12, Progress note on 03/10/2020; PN on 08/20/2020    Authorization Time Period Recert 0/10/3233- 5/73/2202; PN on 06/04/2704; Recert 2/37/6283- 15/17/6160; PN on 7/37/1062; Recert 69/48/5462- 08/04/5007    PT Start Time 1145    PT Stop Time 1225    PT Time Calculation (min) 40 min    Equipment Utilized During Treatment Gait belt    Activity Tolerance Patient tolerated treatment well;Patient limited by fatigue;Patient limited by pain    Behavior During Therapy [facility] for tasks assessed/performed             Past Medical History:  Diagnosis Date   Acute blood loss anemia    Arthritis    Cancer ([place] [facility])    skin   Central cord syndrome at C6 level of cervical spinal cord ([place]) 11/29/2017   Hypertension    Protein-calorie malnutrition, severe ([place]) 01/24/2018   S/P insertion of IVC (inferior vena caval) filter 01/24/2018   Tetraparesis ([place])     Past Surgical History:  Procedure Laterality Date   ANTERIOR CERVICAL DECOMP/DISCECTOMY FUSION N/A 11/29/2017   Procedure: Cervical five-six, six-seven Anterior Cervical Decompression Fusion;  Surgeon: Judith Part, MD;  Location: [place];  Service: Neurosurgery;  Laterality: N/A;  Cervical five-six, six-seven Anterior Cervical Decompression Fusion   CATARACT EXTRACTION     IR IVC FILTER PLMT / S&I /IMG GUID/MOD SED  12/08/2017    There were no vitals filed for this visit.   Subjective Assessment - 01/21/21 1156     Subjective Patient  reports now having her boot back and ready to try to stand if able.    Pertinent History Pt is an 86 yo female that fell in 2019, fractured vertebrae in her neck and in her low back per family. 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 After hospital stay, pt discharged to [facility] for ~1 month, experienced severe UTI as well as bilateral DVT (IVC filter placed). Discharged to [facility], stayed for about a year, and then moved to [facility] in April 2021. Was receiving PT, but per family facility reported that did not have adequate equipment to maximize PT for her. Pt until about 1 month ago was practicing sit to stand transfers with 1-2 people, and working on static standing. Has a brace for L foot due to PF. Pt currently needs assistance with all ADLs (able to assist with donning/doffing her shirt), bed baths, and needs a hoyer lift for transfers. Able to drive power wheelchair without assistance.    Currently in Pain? No/denies                  INTERVENTIONS:   Therapeutic activities:   Standing in // bars: Left AFO brace donned  1) 2 min 30 sec- VC for erect posture. Patient demo increased genu valgus on right knee and demo improved gluteal contraction- able to hold without increased difficulty or cues today.   2) 3 min 18 sec- again improved  erect posture with improved ability to shift hips anteriorly.    3) 2 min 47 sec- Patient performed well yet limited by increased overall fatigue and   3x STS in // bars (patient utilizing holding onto bars and pulling up) - she required max assist on each and exhibited increased fatigue overall after standing- She experienced difficulty locking knees and hips.   Education provided throughout session via VC/TC and demonstration to facilitate movement at target joints and correct muscle activation for all testing and exercises performed.                    PT Education - 01/21/21 1157     Education Details  Exercise technique    Person(s) Educated Patient    Methods Explanation;Demonstration;Tactile cues;Verbal cues    Comprehension Verbalized understanding;Returned demonstration;Verbal cues required;Need further instruction;Tactile cues required              PT Short Term Goals - 03/10/20 2211       PT SHORT TERM GOAL #1   Title The patient will perform initial HEP with minimum assistance in order to improve strength and function.    Baseline Patient demonstrating independence with initial HEP as of 03/10/2020 with no questions or difficulty.    Time 4    Period Weeks    Status Achieved    Target Date 02/11/20               PT Long Term Goals - 12/22/20 1155       PT LONG TERM GOAL #1   Title The patient will be compliant with finalized HEP with minimum assistance in preparation for self management and maintenance of condition.    Baseline 03/10/2020- Patient familiar with initial HEP and will keep goal active as HEP becomes more progressive including possible transfers and standing activities. 04/07/2020- Patient continues to report compliance with LE strengthening home exercises without questions or concerns. 04/21/2020- Patient able to verbalize and demonstrate good understanding of current HEP including some supine and seated LE strengthening exercises. Reviewed and patient performed 10 reps today with minimal cueing. Will keep goal active to progress HEP as appropriate. 05/28/2020- patient reports continues to perform LE strengthening as able and some help from caregiver as able. No changes at this time to home program as patient is limited to supine/seated therex. 07/02/2020- Patient continues to report compliance with current supine and seated LE strengtheing home program and states no questions or concerns regarding her plan.    Time 12    Period Weeks    Status Achieved      PT LONG TERM GOAL #2   Title The patient will demonstrate at least 10 point improvement on FOTO score  indicating an improved ability to perform functional activities.    Baseline on eval (/720) score 12; 10/22/19: 12; 12/10/19: 18, 12/13: 18: 04/07/2020- Will assess next visit. 07/02/2020- Will obtain next visit. 09/24/2020= Will obtain next visit. 11/12/2020= 12; 12/22/2020= 12    Time 12    Period Weeks    Status On-going    Target Date 03/16/21      PT LONG TERM GOAL #3   Title The patient will demonstrate lateral scooting  for assistance with transfers with minA to maximize independence.    Baseline 10/22/19: Pt requires modA+1 for lateral scooting; 12/10/19: modA+1, 12/13: modA-maxA, 12/27: modA-maxA, 03/10/2020- ModA-MaxA- increased verbal cues and visual demonstration. 04/07/2020- Continued Max assist to perform forward/lateral scoot. 04/21/2020- Patient was able to demo  slight lateral scoot with CGA approx 12 in then fatigued requiring max assist- she is able to scoot forward by leaning without significant issues. 05/28/2020- While sitting in chair- patient able to perform forward and lateral scoot with VC/visual demo and increased time allowed- CGA today but not consistent yet- will keep goal active at this time to continue to focus on strengthening and improving technique. 07/02/2020- Patient able to demonstrate minimal ability to laterally shift weigh on mat table requiring max assist yet is able to forward scoot out to edge of mat table with min Assist. 08/20/2020 - Patient was able to demonstrate minimal lateral scooting at edge of mat while holding feet still so they did not slip on tile surface. She continues to be able to demonstrate scoot forward and uses tilt option to recline to position safely back into seat of power wheelchair. 09/25/2020- Patient able to sit on mat and minimally scoot laterally but requires min/mod A for efficient scooting. Will end goal at this time due to plateau in progress with scooting over extended time.    Time 12    Period Weeks    Status Not Met      PT LONG TERM GOAL #4    Title The patient will demonstrate a squat pivot transfer with minimum assistance to maximize independence and mobility.    Baseline 10/22/19: unable to safely attempt at this time; 12/10/19: unable to safely attempt at this time, 01/14/20: unable to safety attempt at this time. 03/10/2020- Patient able to perform Stand pivot transfer with Maximal assist. 04/07/2020- Patient continues to require Max assist with sit to stand- will assist SPT next visit. 04/21/2020- Patient presents with Max assist to perform Sit to stand Transfer- unable to pivot or move Left LE well without difficulty. 07/02/2020= Max assist with SPT and minimal ability to pivot. 7/202/2022-Patient able to stand with minimal assist this visit (as good as CGA last visit) and able to move right foot to pivot and performing with moderate assist today. 09/24/2020- Patient able to perform sit to stand with Min assist from lift chair. Patient has demo inconsistency and will keep goal active.  12/17/2020- Goal is currently not appropriate as patient fractured her ankle and currently NWB. 12/22/2020=Goal is currently not appropriate as patient fractured her ankle and currently NWB.    Time 12    Period Weeks    Status On-going    Target Date 03/16/21      PT LONG TERM GOAL #5   Title The patient will demonstrate sitting without UE support for 2-5 minutes at EOB to improve participation and maximize independence with ADLs.    Baseline 10/22/19: Pt able to sit unsupported for at least 2 minutes at edge of bed. 04/21/2020- Patient continues to demo good sitting at edge of mat > 5 miin without difficulty or back support.    Time 12    Period Weeks    Status Achieved      Additional Long Term Goals   Additional Long Term Goals Yes      PT LONG TERM GOAL #6   Title Patient will demonstrate improved functional LE strength as seen by consistent ability to stand > 2 min (3 out of 4 trials) with Mod/Max A for improved LE strength with transfers.     Baseline 05/28/2020- Patient able to inconsistent stand 1-2 min right now in //bars with Mod/Max A. 07/02/2020- Patient was able to progress last 2 visit to standing using bilateral platform attachment between 1-2  min which is a progression from the parallel bars. 08/20/2020- Patient performed static standing with min- mod A using bilateral platform attachment on walker for 6 min 30 sec today with assist for trunk control and intermittent verbal cues for posture. 09/24/2020- Patient has consistently been able to stand >2 min in // bars and when using bilateral Platform walker. 12/23/2020= Will reopen this goal as patient should transition back to weightbearing and again practice standing in new cert.    Time 12    Period Weeks    Status Deferred    Target Date 03/16/21      PT LONG TERM GOAL #7   Title Patient/caregiver will demonstrate assisted transfer using [place] Lift consistently Independently for improved ability to transfer at home from lift chair to bed.    Baseline 09/24/2020- Patient requires hoyer lift (Dependent) for safe transfers.  12/17/2020- Patient currently still dependent on hoyer as she is currently non-weight bearing due to recent ankle fracture.    Time 12    Period Weeks    Status On-going    Target Date 03/16/21      PT LONG TERM GOAL #8   Title Patient will demo improved right LE quad strength as seen by ability to extend knee to 20 deg from 0 or less while performing a Long arc quad (for improved terminal knee ext and more full ROM)  for improved ability to bearing weight with Right LE.    Baseline 12/22/2020=able to extend knee to approx 35 deg from zero during long arc quad    Time 12    Period Weeks    Status New    Target Date 03/16/20                   Plan - 01/21/21 1158     Clinical Impression Statement Patient was motivated to return to standing as she had received her brace back from being repaired. She denied any left ankle pain throughout session and  initially able to stand well- More erect and anterior weight shifted forward better than past visits. After 3 trials of standing - She began to fatigue very quickly and then agreed to performing 5-10 reps of sit to stands but terminated after 3 reps due to weakness and fatigue. Pt will benefit from skilled physical therapy to increase mobility and strength to improve patient's quality of life    Personal Factors and Comorbidities Age;Time since onset of injury/illness/exacerbation;Comorbidity 3+;Fitness    Comorbidities HTN, quadriparesis, history of DVT, neurogenic bladder    Examination-Activity Limitations Bathing;Hygiene/Grooming;Squat;Bed Mobility;Lift;Bend;Stand;Engineer, manufacturing;Toileting;Self Feeding;Transfers;Continence;Sit;Dressing;Sleep;Carry    Examination-Participation Restrictions Church;Laundry;Cleaning;Medication Management;Community Activity;Meal Prep;Interpersonal Relationship    Stability/Clinical Decision Making Evolving/Moderate complexity    Rehab Potential Fair    PT Frequency 2x / week    PT Duration 12 weeks    PT Treatment/Interventions ADLs/Self Care Home Management;Electrical Stimulation;Therapeutic activities;Wheelchair mobility training;Vasopneumatic Device;Joint Manipulations;Vestibular;Passive range of motion;Patient/family education;Therapeutic exercise;DME Instruction;Biofeedback;Aquatic Therapy;Moist Heat;Gait training;Balance training;Orthotic Fit/Training;Dry needling;Energy conservation;Taping;Splinting;Neuromuscular re-education;Cryotherapy;Ultrasound;Functional mobility training    PT Next Visit Plan Continue with progressive standing/transfer training once MD approved    PT Home Exercise Plan no changes    Consulted and Agree with Plan of Care Patient             Patient will benefit from skilled therapeutic intervention in order to improve the following deficits and impairments:  Abnormal gait, Decreased balance, Decreased endurance,  Decreased mobility, Difficulty walking, Hypomobility, Impaired sensation, Decreased range of motion, Improper body mechanics, Impaired  perceived functional ability, Decreased activity tolerance, Decreased knowledge of use of DME, Decreased safety awareness, Decreased strength, Impaired flexibility, Impaired UE functional use, Postural dysfunction  Visit Diagnosis: Abnormality of gait and mobility  Difficulty in walking, not elsewhere classified  Muscle weakness (generalized)  Unsteadiness on feet     Problem List Patient Active Problem List   Diagnosis Date Noted   Acute appendicitis with appendiceal abscess 05/30/2018   Hypocalcemia 02/15/2018   Dysuria 02/15/2018   Bilateral lower extremity edema 02/11/2018   Vaginal yeast infection 01/30/2018   Chest tightness 01/27/2018   Healthcare-associated pneumonia 01/25/2018   Pleural effusion, not elsewhere classified 01/25/2018   Acute deep vein thrombosis (DVT) of left lower extremity ([place]) 01/24/2018   Acute deep vein thrombosis (DVT) of right lower extremity ([place]) 01/24/2018   Chronic allergic rhinitis 01/24/2018   Depression with anxiety 01/24/2018   UTI due to Klebsiella species 01/24/2018   Protein-calorie malnutrition, severe ([place]) 01/24/2018   S/P insertion of IVC (inferior vena caval) filter 01/24/2018   Reactive depression    Hypokalemia    Leukocytosis    Essential hypertension    Trauma    Tetraparesis ([facility])    Neuropathic pain    Neurogenic bowel    Neurogenic bladder    Benign essential HTN    Acute blood loss anemia    Central cord syndrome at C6 level of cervical spinal cord ([place]) 11/29/2017   Central cord syndrome ([place]) 11/29/2017    Lewis Moccasin, PT 01/22/2021, 8:05 AM  [place], [place], [address] Phone: [phone]   Fax:  [phone]  Name: PATTI SHORB MRN: [id number] Date of Birth: 07-Nov-1934

## 2021-01-22 ENCOUNTER — Encounter: Payer: Self-pay | Admitting: Occupational Therapy

## 2021-01-22 NOTE — Therapy (Signed)
[place] MAIN [facility] SERVICES [address] [place], [place], [address] Phone: [phone]   Fax:  [phone]  Occupational Therapy Treatment  Patient Details  Name: Sherry [name] MRN: [id number] Date of Birth: 03-26-1934 No data recorded  Encounter Date: 01/21/2021   OT End of Session - 01/22/21 1025     Visit Number 96    Number of Visits 130    Date for OT Re-Evaluation 04/06/21    Authorization Time Period Progress report period starting 11/26/2020    OT Start Time 32    OT Stop Time 1145    OT Time Calculation (min) 45 min    Equipment Utilized During Treatment moist heat    Activity Tolerance Patient tolerated treatment well    Behavior During Therapy [facility] for tasks assessed/performed             Past Medical History:  Diagnosis Date   Acute blood loss anemia    Arthritis    Cancer ([place])    skin   Central cord syndrome at C6 level of cervical spinal cord ([place]) 11/29/2017   Hypertension    Protein-calorie malnutrition, severe ([place]) 01/24/2018   S/P insertion of IVC (inferior vena caval) filter 01/24/2018   Tetraparesis ([place])     Past Surgical History:  Procedure Laterality Date   ANTERIOR CERVICAL DECOMP/DISCECTOMY FUSION N/A 11/29/2017   Procedure: Cervical five-six, six-seven Anterior Cervical Decompression Fusion;  Surgeon: Judith Part, MD;  Location: [place];  Service: Neurosurgery;  Laterality: N/A;  Cervical five-six, six-seven Anterior Cervical Decompression Fusion   CATARACT EXTRACTION     IR IVC FILTER PLMT / S&I /IMG GUID/MOD SED  12/08/2017    There were no vitals filed for this visit.   Subjective Assessment - 01/22/21 1024     Subjective  Pt. reports doing well today.    Patient is accompanied by: Family member    Pertinent History Patient s/p fall November 29, 2017 resulting in diagnosis of central cord syndrome at C 6 level.  She has had therapy in multiple venues but with recent move to  [facility], therapy staff recommended she seek outpatient therapy for her needs.    Currently in Pain? No/denies            OT TREATMENT     Pt. tolerated bilateral shoulder flexion, extension, abduction, elbow flexion, extension forearm supination AAROM/AROM. PROM bilateral wrist extension, digit MP, PIP, and DIP flexion, and extension, thumb radial, and palmar abduction in conjunction with moist heat. Pt. worked on [employer], and reciprocal motion using the UBE while sitting for 8 min. with minimal resistance.   Pt. continues to make excellent progress, and is improving with reaching further with her BUEs in preparation for improving overhead ADLs, and haircare. Pt. was able to tolerate the UBE for 8 min. with the UBE elevated for increased ROM. Pt. continues to be able to reach further to assist with performing haircare, and continues to use her hands for self-feeding, holding utensils, and writing. Pt. tolerated ROM well without reports of pain, or discomfort. Pt. continues to present with bilateral wrist flexor tightness, and digit extensor tightness. Pt. continues to benefit from working on impoving ROM in order to work towards increasing engagement of her bilateral hands during ADLs, and IADL tasks.                       OT Education - 01/22/21 1025  Education Details BUE functioning, and functinal reaching    Person(s) Educated Patient;Caregiver(s)    Comprehension Verbalized understanding;Need further instruction;Verbal cues required                 OT Long Term Goals - 01/12/21 1201       OT LONG TERM GOAL #1   Title Patient and caregiver will demonstrate understanding of home exercise program for ROM.    Baseline Pt.'s current restorative aide is retiring at the end of December. Pt. continues to have a restorative aide rehab aide assist her wih exercises at [facility]. 8/8: Independently completes/directs HEP mulitple times/week    Time 12     Period Weeks    Status Partially Met    Target Date 04/06/21      OT LONG TERM GOAL #2   Title Patient will demonstrate ability to don shirt with min assist from seated position.    Baseline Pt. is indepedent with a larger shirt, and continues to require increased time to complete,    Time 12    Period Weeks    Status Achieved      OT LONG TERM GOAL #3   Title Patient will demonstrate improved sitting balance at the edge of the bed to participate in self care tasks.    Baseline Pt. has an air mattress at [facility]    Time 12    Period Weeks    Status Deferred      OT LONG TERM GOAL #4   Title Patient will demonstrate improved composite finger flexion to hold deodorant to apply to underarms.    Baseline 01/12/2021:  Pt. is progressing, and is able to reach to grasp, and hold narrow cones. 10/20/2020: Pt. conitnues to make steady progress with bilateral digit flexion.Pt. continues to present with stiifness/tightness which is hindering pt.'s ability to formulate a full composite fist. Pt. continues to present with improving finger flexion of R hand for a partial gross grasp, but unable to grasp and hold items. pt. conitnues to present with limited digit flexion of L hand. Pt. has active left thumb abduction. 8/8: holds cup with 2 hands, continues to have limited composite finger flexion L worse than R.    Time 12    Period Weeks    Status On-going    Target Date 04/06/21      OT LONG TERM GOAL #5   Title Pt will complete self feeding with setup, use of universal cuff and minimal spillage.    Baseline inconsistent with feeding and increased spillage. Pt is able to use a fork without spillage. Pt. has spillage with a spoon.    Time 12    Period Weeks    Status Achieved    Target Date 12/10/19      OT LONG TERM GOAL #6   Title Patient will increase right UE ROM to comb the back of her hair with modified independence.    Baseline 01/12/2022: Pt. is able to comb her hair, reaching to  the back of her hair.. 10/20/2020: Pt. continues to progress wtith RRUE ROM, and is able to use a pic for the back of her head, however is unable to brush the back of her hair. 07/30/2020: Pt. is now able to comb the back of her hair with a pic, however continues to work on completing the task efficiently. 8/8: Achieves with great effort, has assistance for efficiancy    Time 12    Period Weeks  Status Partially Met    Target Date 04/06/21      OT LONG TERM GOAL #7   Title Pt. will independently, and efficiently send text messages on her phone.    Baseline 01/12/2022:: pt. is now able to type text messages on her phone. 10/20/2020: Pt. is now able to type a text message on the phone, however not efficiently taking increased time to complete. 07/30/2020: Pt. continues to present with limited BUE strength, and [facility] skills. 8/8: independtly drafts text messages, continues to work on accuracy and efficiancy 2/2 limited [facility]    Time 12    Period Weeks    Status Partially Met    Target Date 04/06/21      OT LONG TERM GOAL #8   Title Pt. will write, and sign her name with 100% legibility, and modified independence.    Baseline 01/12/2021: Pt. continnues to fill out her daily menu and complete puzzles. 10/20/2020: Pt. continues to make progress overall. Pt. continues to consistently fill out her daily menu, and writing for crossword puzzles. Pt. is able to maintain grasp on a wide width pen. [place] to work on [employer]. 8/8: writes daily, continues to work on improving speed    Time 12    Period Weeks    Status Partially Met    Target Date 04/06/21                   Plan - 01/22/21 1026     Clinical Impression Statement Pt. continues to make excellent progress, and is improving with reaching further with her BUEs in preparation for improving overhead ADLs, and haircare. Pt. was able to tolerate the UBE for 8 min. with the UBE elevated for increased ROM. Pt. continues to be able to  reach further to assist with performing haircare, and continues to use her hands for self-feeding, holding utensils, and writing. Pt. tolerated ROM well without reports of pain, or discomfort. Pt. continues to present with bilateral wrist flexor tightness, and digit extensor tightness. Pt. continues to benefit from working on impoving ROM in order to work towards increasing engagement of her bilateral hands during ADLs, and IADL tasks.         OT Occupational Profile and History Detailed Assessment- Review of Records and additional review of physical, cognitive, psychosocial history related to current functional performance    Occupational performance deficits (Please refer to evaluation for details): ADL's;IADL's;Leisure;Social Participation    Body Structure / Function / Physical Skills ADL;Continence;Dexterity;Flexibility;Strength;ROM;Balance;Coordination;FMC;IADL;Endurance;UE functional use;Decreased knowledge of use of DME;GMC    Psychosocial Skills Coping Strategies    Rehab Potential Fair    Clinical Decision Making Several treatment options, min-mod task modification necessary    Comorbidities Affecting Occupational Performance: Presence of comorbidities impacting occupational performance    Comorbidities impacting occupational performance description: contractures of bilateral hands, dependent transfers,    Modification or Assistance to Complete Evaluation  No modification of tasks or assist necessary to complete eval    OT Frequency 2x / week    OT Duration 12 weeks    OT Treatment/Interventions Self-care/ADL training;Cryotherapy;Therapeutic exercise;DME and/or AE instruction;Balance training;Neuromuscular education;Manual Therapy;Splinting;Moist Heat;Passive range of motion;Therapeutic activities;Patient/family education    Consulted and Agree with Plan of Care Patient             Patient will benefit from skilled therapeutic intervention in order to improve the following  deficits and impairments:   Body Structure / Function / Physical Skills: ADL, Continence, Dexterity, Flexibility, Strength, ROM, Balance,  Coordination, [place], IADL, Endurance, UE functional use, Decreased knowledge of use of DME, [place]   Psychosocial Skills: Coping Strategies   Visit Diagnosis: Muscle weakness (generalized)  Other lack of coordination    Problem List Patient Active Problem List   Diagnosis Date Noted   Acute appendicitis with appendiceal abscess 05/30/2018   Hypocalcemia 02/15/2018   Dysuria 02/15/2018   Bilateral lower extremity edema 02/11/2018   Vaginal yeast infection 01/30/2018   Chest tightness 01/27/2018   Healthcare-associated pneumonia 01/25/2018   Pleural effusion, not elsewhere classified 01/25/2018   Acute deep vein thrombosis (DVT) of left lower extremity ([place]) 01/24/2018   Acute deep vein thrombosis (DVT) of right lower extremity ([place]) 01/24/2018   Chronic allergic rhinitis 01/24/2018   Depression with anxiety 01/24/2018   UTI due to Klebsiella species 01/24/2018   Protein-calorie malnutrition, severe ([name]) 01/24/2018   S/P insertion of IVC (inferior vena caval) filter 01/24/2018   Reactive depression    Hypokalemia    Leukocytosis    Essential hypertension    Trauma    Tetraparesis ([facility])    Neuropathic pain    Neurogenic bowel    Neurogenic bladder    Benign essential HTN    Acute blood loss anemia    Central cord syndrome at C6 level of cervical spinal cord ([place]) 11/29/2017   Central cord syndrome ([place]) 11/29/2017    Harrel Carina, MS, OTR/L 01/22/2021, 10:28 AM  [place] MAIN [facility] SERVICES [address] [place], [place], [address] Phone: [phone]   Fax:  [phone]  Name: Sherry [name] MRN: [id number] Date of Birth: 15-Oct-1934

## 2021-01-28 ENCOUNTER — Other Ambulatory Visit: Payer: Self-pay

## 2021-01-28 ENCOUNTER — Ambulatory Visit: Payer: Medicare HMO | Admitting: Occupational Therapy

## 2021-01-28 ENCOUNTER — Encounter: Payer: Self-pay | Admitting: Occupational Therapy

## 2021-01-28 ENCOUNTER — Ambulatory Visit: Payer: Medicare HMO

## 2021-01-28 DIAGNOSIS — R278 Other lack of coordination: Secondary | ICD-10-CM

## 2021-01-28 DIAGNOSIS — M6281 Muscle weakness (generalized): Secondary | ICD-10-CM

## 2021-01-28 DIAGNOSIS — R269 Unspecified abnormalities of gait and mobility: Secondary | ICD-10-CM

## 2021-01-28 DIAGNOSIS — R262 Difficulty in walking, not elsewhere classified: Secondary | ICD-10-CM

## 2021-01-28 DIAGNOSIS — R2681 Unsteadiness on feet: Secondary | ICD-10-CM

## 2021-01-28 NOTE — Therapy (Signed)
[place] MAIN [facility] SERVICES [address] [place], [place], [address] Phone: [phone]   Fax:  [phone]  Physical Therapy Treatment  Patient Details  Name: Sherry [name] MRN: [id number] Date of Birth: 03-Dec-1934 No data recorded  Encounter Date: 01/28/2021   PT End of Session - 01/28/21 1147     Visit Number 120    Number of Visits 136    Date for PT Re-Evaluation 03/16/21    Authorization Type [employer] FOTO performed by PT on eval (7/20), score 12, Progress note on 03/10/2020; PN on 08/20/2020    Authorization Time Period Recert 0/08/8673- 4/49/2010; PN on 0/08/1217; Recert 7/58/8325- 49/82/6415; PN on 09/30/9405; Recert 68/09/8108- 04/15/9456    PT Start Time 1144    PT Stop Time 1227    PT Time Calculation (min) 43 min    Equipment Utilized During Treatment Gait belt    Activity Tolerance Patient tolerated treatment well;Patient limited by fatigue;Patient limited by pain    Behavior During Therapy [facility] for tasks assessed/performed             Past Medical History:  Diagnosis Date   Acute blood loss anemia    Arthritis    Cancer ([place])    skin   Central cord syndrome at C6 level of cervical spinal cord ([place]) 11/29/2017   Hypertension    Protein-calorie malnutrition, severe ([place]) 01/24/2018   S/P insertion of IVC (inferior vena caval) filter 01/24/2018   Tetraparesis ([place])     Past Surgical History:  Procedure Laterality Date   ANTERIOR CERVICAL DECOMP/DISCECTOMY FUSION N/A 11/29/2017   Procedure: Cervical five-six, six-seven Anterior Cervical Decompression Fusion;  Surgeon: Judith Part, MD;  Location: [place];  Service: Neurosurgery;  Laterality: N/A;  Cervical five-six, six-seven Anterior Cervical Decompression Fusion   CATARACT EXTRACTION     IR IVC FILTER PLMT / S&I /IMG GUID/MOD SED  12/08/2017    There were no vitals filed for this visit.   Subjective Assessment - 01/28/21 1145     Subjective Patient  reports no new complaints and states she had a good Christmas with family.    Pertinent History Pt is an 86 yo female that fell in 2019, fractured vertebrae in her neck and in her low back per family. 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 After hospital stay, pt discharged to [facility] for ~1 month, experienced severe UTI as well as bilateral DVT (IVC filter placed). Discharged to [facility], stayed for about a year, and then moved to [facility] in April 2021. Was receiving PT, but per family facility reported that did not have adequate equipment to maximize PT for her. Pt until about 1 month ago was practicing sit to stand transfers with 1-2 people, and working on static standing. Has a brace for L foot due to PF. Pt currently needs assistance with all ADLs (able to assist with donning/doffing her shirt), bed baths, and needs a hoyer lift for transfers. Able to drive power wheelchair without assistance.    Currently in Pain? No/denies                 INTERVENTIONS:    Therapeutic activities:    Standing in // bars: Left AFO brace donned   1) 2 min 30 sec- VC and some initial physical assist  for erect posture. VC to continue to shift forward- hips over feet and maintain- requiring CGA/Min A    2) 2 min 30 sec- again improved  erect posture and able to lateral weight shift without knee buckling at least 15 times.     3) 2 min 40 sec- Patient performed well again - able to hold position with hips anterior positioned over feet- abel to lateral weight shift x 15 reps as well.    4) 1 min 53 sec - Patient able to stand and then focus on raising 1 UE up off // bars at a time x 10 reps each. Then even to perform a few reps while raising both UE (100% WB through BLE's)  5)  1 min 18 sec- Patient was fatigued after 30 sec and began shaking but still able to hold herself up well with min assist. Stopped due to increased fatigue  Education provided throughout session via VC/TC  and demonstration to facilitate movement at target joints and correct muscle activation for all testing and exercises performed.                        PT Education - 01/28/21 1146     Education Details Exercise technique    Person(s) Educated Patient    Methods Explanation;Demonstration;Tactile cues;Verbal cues    Comprehension Verbalized understanding;Returned demonstration;Verbal cues required;Tactile cues required;Need further instruction              PT Short Term Goals - 03/10/20 2211       PT SHORT TERM GOAL #1   Title The patient will perform initial HEP with minimum assistance in order to improve strength and function.    Baseline Patient demonstrating independence with initial HEP as of 03/10/2020 with no questions or difficulty.    Time 4    Period Weeks    Status Achieved    Target Date 02/11/20               PT Long Term Goals - 12/22/20 1155       PT LONG TERM GOAL #1   Title The patient will be compliant with finalized HEP with minimum assistance in preparation for self management and maintenance of condition.    Baseline 03/10/2020- Patient familiar with initial HEP and will keep goal active as HEP becomes more progressive including possible transfers and standing activities. 04/07/2020- Patient continues to report compliance with LE strengthening home exercises without questions or concerns. 04/21/2020- Patient able to verbalize and demonstrate good understanding of current HEP including some supine and seated LE strengthening exercises. Reviewed and patient performed 10 reps today with minimal cueing. Will keep goal active to progress HEP as appropriate. 05/28/2020- patient reports continues to perform LE strengthening as able and some help from caregiver as able. No changes at this time to home program as patient is limited to supine/seated therex. 07/02/2020- Patient continues to report compliance with current supine and seated LE strengtheing  home program and states no questions or concerns regarding her plan.    Time 12    Period Weeks    Status Achieved      PT LONG TERM GOAL #2   Title The patient will demonstrate at least 10 point improvement on FOTO score indicating an improved ability to perform functional activities.    Baseline on eval (/720) score 12; 10/22/19: 12; 12/10/19: 18, 12/13: 18: 04/07/2020- Will assess next visit. 07/02/2020- Will obtain next visit. 09/24/2020= Will obtain next visit. 11/12/2020= 12; 12/22/2020= 12    Time 12    Period Weeks    Status On-going    Target Date 03/16/21  PT LONG TERM GOAL #3   Title The patient will demonstrate lateral scooting  for assistance with transfers with minA to maximize independence.    Baseline 10/22/19: Pt requires modA+1 for lateral scooting; 12/10/19: modA+1, 12/13: modA-maxA, 12/27: modA-maxA, 03/10/2020- ModA-MaxA- increased verbal cues and visual demonstration. 04/07/2020- Continued Max assist to perform forward/lateral scoot. 04/21/2020- Patient was able to demo slight lateral scoot with CGA approx 12 in then fatigued requiring max assist- she is able to scoot forward by leaning without significant issues. 05/28/2020- While sitting in chair- patient able to perform forward and lateral scoot with VC/visual demo and increased time allowed- CGA today but not consistent yet- will keep goal active at this time to continue to focus on strengthening and improving technique. 07/02/2020- Patient able to demonstrate minimal ability to laterally shift weigh on mat table requiring max assist yet is able to forward scoot out to edge of mat table with min Assist. 08/20/2020 - Patient was able to demonstrate minimal lateral scooting at edge of mat while holding feet still so they did not slip on tile surface. She continues to be able to demonstrate scoot forward and uses tilt option to recline to position safely back into seat of power wheelchair. 09/25/2020- Patient able to sit on mat and minimally  scoot laterally but requires min/mod A for efficient scooting. Will end goal at this time due to plateau in progress with scooting over extended time.    Time 12    Period Weeks    Status Not Met      PT LONG TERM GOAL #4   Title The patient will demonstrate a squat pivot transfer with minimum assistance to maximize independence and mobility.    Baseline 10/22/19: unable to safely attempt at this time; 12/10/19: unable to safely attempt at this time, 01/14/20: unable to safety attempt at this time. 03/10/2020- Patient able to perform Stand pivot transfer with Maximal assist. 04/07/2020- Patient continues to require Max assist with sit to stand- will assist SPT next visit. 04/21/2020- Patient presents with Max assist to perform Sit to stand Transfer- unable to pivot or move Left LE well without difficulty. 07/02/2020= Max assist with SPT and minimal ability to pivot. 7/202/2022-Patient able to stand with minimal assist this visit (as good as CGA last visit) and able to move right foot to pivot and performing with moderate assist today. 09/24/2020- Patient able to perform sit to stand with Min assist from lift chair. Patient has demo inconsistency and will keep goal active.  12/17/2020- Goal is currently not appropriate as patient fractured her ankle and currently NWB. 12/22/2020=Goal is currently not appropriate as patient fractured her ankle and currently NWB.    Time 12    Period Weeks    Status On-going    Target Date 03/16/21      PT LONG TERM GOAL #5   Title The patient will demonstrate sitting without UE support for 2-5 minutes at EOB to improve participation and maximize independence with ADLs.    Baseline 10/22/19: Pt able to sit unsupported for at least 2 minutes at edge of bed. 04/21/2020- Patient continues to demo good sitting at edge of mat > 5 miin without difficulty or back support.    Time 12    Period Weeks    Status Achieved      Additional Long Term Goals   Additional Long Term Goals Yes       PT LONG TERM GOAL #6   Title Patient will demonstrate improved  functional LE strength as seen by consistent ability to stand > 2 min (3 out of 4 trials) with Mod/Max A for improved LE strength with transfers.    Baseline 05/28/2020- Patient able to inconsistent stand 1-2 min right now in //bars with Mod/Max A. 07/02/2020- Patient was able to progress last 2 visit to standing using bilateral platform attachment between 1-2 min which is a progression from the parallel bars. 08/20/2020- Patient performed static standing with min- mod A using bilateral platform attachment on walker for 6 min 30 sec today with assist for trunk control and intermittent verbal cues for posture. 09/24/2020- Patient has consistently been able to stand >2 min in // bars and when using bilateral Platform walker. 12/23/2020= Will reopen this goal as patient should transition back to weightbearing and again practice standing in new cert.    Time 12    Period Weeks    Status Deferred    Target Date 03/16/21      PT LONG TERM GOAL #7   Title Patient/caregiver will demonstrate assisted transfer using [place] Lift consistently Independently for improved ability to transfer at home from lift chair to bed.    Baseline 09/24/2020- Patient requires hoyer lift (Dependent) for safe transfers.  12/17/2020- Patient currently still dependent on hoyer as she is currently non-weight bearing due to recent ankle fracture.    Time 12    Period Weeks    Status On-going    Target Date 03/16/21      PT LONG TERM GOAL #8   Title Patient will demo improved right LE quad strength as seen by ability to extend knee to 20 deg from 0 or less while performing a Long arc quad (for improved terminal knee ext and more full ROM)  for improved ability to bearing weight with Right LE.    Baseline 12/22/2020=able to extend knee to approx 35 deg from zero during long arc quad    Time 12    Period Weeks    Status New    Target Date 03/16/20                    Plan - 01/28/21 1149     Clinical Impression Statement Patient presents with good motivation for today's session. She was able to demonstrate improved ability to anteriorly weight shift to accomplish transitioning her hips over her feet and able to stand well without knee buckling today. She even progressed to light UE support and able to raise one and then both arms up to totally bear weight through her LE's. She was well pleased and performed very well with much improved overall LE strength today. Pt will benefit from skilled physical therapy to increase mobility and strength to improve patient's quality of life    Personal Factors and Comorbidities Age;Time since onset of injury/illness/exacerbation;Comorbidity 3+;Fitness    Comorbidities HTN, quadriparesis, history of DVT, neurogenic bladder    Examination-Activity Limitations Bathing;Hygiene/Grooming;Squat;Bed Mobility;Lift;Bend;Stand;Engineer, manufacturing;Toileting;Self Feeding;Transfers;Continence;Sit;Dressing;Sleep;Carry    Examination-Participation Restrictions Church;Laundry;Cleaning;Medication Management;Community Activity;Meal Prep;Interpersonal Relationship    Stability/Clinical Decision Making Evolving/Moderate complexity    Rehab Potential Fair    PT Frequency 2x / week    PT Duration 12 weeks    PT Treatment/Interventions ADLs/Self Care Home Management;Electrical Stimulation;Therapeutic activities;Wheelchair mobility training;Vasopneumatic Device;Joint Manipulations;Vestibular;Passive range of motion;Patient/family education;Therapeutic exercise;DME Instruction;Biofeedback;Aquatic Therapy;Moist Heat;Gait training;Balance training;Orthotic Fit/Training;Dry needling;Energy conservation;Taping;Splinting;Neuromuscular re-education;Cryotherapy;Ultrasound;Functional mobility training    PT Next Visit Plan Continue with progressive standing/transfer training once MD approved    PT Home Exercise Plan no  changes     Consulted and Agree with Plan of Care Patient             Patient will benefit from skilled therapeutic intervention in order to improve the following deficits and impairments:  Abnormal gait, Decreased balance, Decreased endurance, Decreased mobility, Difficulty walking, Hypomobility, Impaired sensation, Decreased range of motion, Improper body mechanics, Impaired perceived functional ability, Decreased activity tolerance, Decreased knowledge of use of DME, Decreased safety awareness, Decreased strength, Impaired flexibility, Impaired UE functional use, Postural dysfunction  Visit Diagnosis: Abnormality of gait and mobility  Difficulty in walking, not elsewhere classified  Muscle weakness (generalized)  Other lack of coordination  Unsteadiness on feet     Problem List Patient Active Problem List   Diagnosis Date Noted   Acute appendicitis with appendiceal abscess 05/30/2018   Hypocalcemia 02/15/2018   Dysuria 02/15/2018   Bilateral lower extremity edema 02/11/2018   Vaginal yeast infection 01/30/2018   Chest tightness 01/27/2018   Healthcare-associated pneumonia 01/25/2018   Pleural effusion, not elsewhere classified 01/25/2018   Acute deep vein thrombosis (DVT) of left lower extremity ([place]) 01/24/2018   Acute deep vein thrombosis (DVT) of right lower extremity ([place]) 01/24/2018   Chronic allergic rhinitis 01/24/2018   Depression with anxiety 01/24/2018   UTI due to Klebsiella species 01/24/2018   Protein-calorie malnutrition, severe ([place]) 01/24/2018   S/P insertion of IVC (inferior vena caval) filter 01/24/2018   Reactive depression    Hypokalemia    Leukocytosis    Essential hypertension    Trauma    Tetraparesis ([facility])    Neuropathic pain    Neurogenic bowel    Neurogenic bladder    Benign essential HTN    Acute blood loss anemia    Central cord syndrome at C6 level of cervical spinal cord ([place]) 11/29/2017   Central cord syndrome ([place]) 11/29/2017    Lewis Moccasin, PT 01/28/2021, 4:59 PM  [place] MAIN [facility] SERVICES [address] [place], [place], [address] Phone: [phone]   Fax:  [phone]  Name: Sherry [name] MRN: [id number] Date of Birth: 1934/11/18

## 2021-01-28 NOTE — Therapy (Signed)
[place] MAIN [facility] SERVICES [address] [place], [place], [address] Phone: [phone]   Fax:  [phone]  Occupational Therapy Treatment  Patient Details  Name: Sherry [name] MRN: [id number] Date of Birth: Mar 31, 1934 No data recorded  Encounter Date: 01/28/2021   OT End of Session - 01/28/21 1353     Visit Number 97    Number of Visits 130    Date for OT Re-Evaluation 04/06/21    Authorization Time Period Progress report period starting 11/26/2020    OT Start Time 1100    OT Stop Time 1141    OT Time Calculation (min) 41 min    Equipment Utilized During Treatment moist heat    Activity Tolerance Patient tolerated treatment well    Behavior During Therapy [facility] for tasks assessed/performed             Past Medical History:  Diagnosis Date   Acute blood loss anemia    Arthritis    Cancer ([place])    skin   Central cord syndrome at C6 level of cervical spinal cord ([place]) 11/29/2017   Hypertension    Protein-calorie malnutrition, severe ([place]) 01/24/2018   S/P insertion of IVC (inferior vena caval) filter 01/24/2018   Tetraparesis ([place])     Past Surgical History:  Procedure Laterality Date   ANTERIOR CERVICAL DECOMP/DISCECTOMY FUSION N/A 11/29/2017   Procedure: Cervical five-six, six-seven Anterior Cervical Decompression Fusion;  Surgeon: Judith Part, MD;  Location: [place];  Service: Neurosurgery;  Laterality: N/A;  Cervical five-six, six-seven Anterior Cervical Decompression Fusion   CATARACT EXTRACTION     IR IVC FILTER PLMT / S&I /IMG GUID/MOD SED  12/08/2017    There were no vitals filed for this visit.   Subjective Assessment - 01/28/21 1353     Subjective  Pt. reports having had a nice Christmas with her family.    Patient is accompanied by: Family member    Pertinent History Patient s/p fall November 29, 2017 resulting in diagnosis of central cord syndrome at C 6 level.  She has had therapy in multiple venues  but with recent move to [facility], therapy staff recommended she seek outpatient therapy for her needs.    Currently in Pain? No/denies            OT TREATMENT     Pt. tolerated bilateral shoulder flexion, extension, abduction, elbow flexion, extension forearm supination AAROM/AROM. PROM bilateral wrist extension, digit MP, PIP, and DIP flexion, and extension, thumb radial, and palmar abduction in conjunction with moist heat. Pt. worked on [employer], and reciprocal motion using the UBE while sitting for 8 min. with minimal resistance.   Pt. continues to make excellent progress, and is improving with reaching further with her BUEs in preparation for improving overhead ADLs, and haircare. Pt. was able to tolerate the UBE for 8 min. with the UBE elevated for increased ROM. Pt. tolerated ROM well without reports of pain, or discomfort. Pt. continues to present with bilateral wrist flexor tightness, and digit extensor tightness. Pt. continues to benefit from working on impoving ROM in order to work towards increasing engagement of her bilateral hands during ADLs, and IADL tasks.                           OT Education - 01/28/21 1353     Education Details BUE functioning, and functinal reaching    Person(s) Educated Patient;Caregiver(s)  Methods Explanation;Demonstration;Verbal cues    Comprehension Verbalized understanding;Need further instruction;Verbal cues required                 OT Long Term Goals - 01/12/21 1201       OT LONG TERM GOAL #1   Title Patient and caregiver will demonstrate understanding of home exercise program for ROM.    Baseline Pt.'s current restorative aide is retiring at the end of December. Pt. continues to have a restorative aide rehab aide assist her wih exercises at [facility]. 8/8: Independently completes/directs HEP mulitple times/week    Time 12    Period Weeks    Status Partially Met    Target Date 04/06/21      OT  LONG TERM GOAL #2   Title Patient will demonstrate ability to don shirt with min assist from seated position.    Baseline Pt. is indepedent with a larger shirt, and continues to require increased time to complete,    Time 12    Period Weeks    Status Achieved      OT LONG TERM GOAL #3   Title Patient will demonstrate improved sitting balance at the edge of the bed to participate in self care tasks.    Baseline Pt. has an air mattress at [facility]    Time 12    Period Weeks    Status Deferred      OT LONG TERM GOAL #4   Title Patient will demonstrate improved composite finger flexion to hold deodorant to apply to underarms.    Baseline 01/12/2021:  Pt. is progressing, and is able to reach to grasp, and hold narrow cones. 10/20/2020: Pt. conitnues to make steady progress with bilateral digit flexion.Pt. continues to present with stiifness/tightness which is hindering pt.'s ability to formulate a full composite fist. Pt. continues to present with improving finger flexion of R hand for a partial gross grasp, but unable to grasp and hold items. pt. conitnues to present with limited digit flexion of L hand. Pt. has active left thumb abduction. 8/8: holds cup with 2 hands, continues to have limited composite finger flexion L worse than R.    Time 12    Period Weeks    Status On-going    Target Date 04/06/21      OT LONG TERM GOAL #5   Title Pt will complete self feeding with setup, use of universal cuff and minimal spillage.    Baseline inconsistent with feeding and increased spillage. Pt is able to use a fork without spillage. Pt. has spillage with a spoon.    Time 12    Period Weeks    Status Achieved    Target Date 12/10/19      OT LONG TERM GOAL #6   Title Patient will increase right UE ROM to comb the back of her hair with modified independence.    Baseline 01/12/2022: Pt. is able to comb her hair, reaching to the back of her hair.. 10/20/2020: Pt. continues to progress wtith RRUE ROM,  and is able to use a pic for the back of her head, however is unable to brush the back of her hair. 07/30/2020: Pt. is now able to comb the back of her hair with a pic, however continues to work on completing the task efficiently. 8/8: Achieves with great effort, has assistance for efficiancy    Time 12    Period Weeks    Status Partially Met    Target Date 04/06/21  OT LONG TERM GOAL #7   Title Pt. will independently, and efficiently send text messages on her phone.    Baseline 01/12/2022:: pt. is now able to type text messages on her phone. 10/20/2020: Pt. is now able to type a text message on the phone, however not efficiently taking increased time to complete. 07/30/2020: Pt. continues to present with limited BUE strength, and [facility] skills. 8/8: independtly drafts text messages, continues to work on accuracy and efficiancy 2/2 limited [facility]    Time 12    Period Weeks    Status Partially Met    Target Date 04/06/21      OT LONG TERM GOAL #8   Title Pt. will write, and sign her name with 100% legibility, and modified independence.    Baseline 01/12/2021: Pt. continnues to fill out her daily menu and complete puzzles. 10/20/2020: Pt. continues to make progress overall. Pt. continues to consistently fill out her daily menu, and writing for crossword puzzles. Pt. is able to maintain grasp on a wide width pen. [place] to work on [employer]. 8/8: writes daily, continues to work on improving speed    Time 12    Period Weeks    Status Partially Met    Target Date 04/06/21                   Plan - 01/28/21 1355     Clinical Impression Statement Pt. continues to make excellent progress, and is improving with reaching further with her BUEs in preparation for improving overhead ADLs, and haircare. Pt. was able to tolerate the UBE for 8 min. with the UBE elevated for increased ROM. Pt. tolerated ROM well without reports of pain, or discomfort. Pt. continues to present with bilateral  wrist flexor tightness, and digit extensor tightness. Pt. continues to benefit from working on impoving ROM in order to work towards increasing engagement of her bilateral hands during ADLs, and IADL tasks.         OT Occupational Profile and History Detailed Assessment- Review of Records and additional review of physical, cognitive, psychosocial history related to current functional performance    Occupational performance deficits (Please refer to evaluation for details): ADL's;IADL's;Leisure;Social Participation    Body Structure / Function / Physical Skills ADL;Continence;Dexterity;Flexibility;Strength;ROM;Balance;Coordination;FMC;IADL;Endurance;UE functional use;Decreased knowledge of use of DME;GMC    Psychosocial Skills Coping Strategies    Rehab Potential Fair    Clinical Decision Making Several treatment options, min-mod task modification necessary    Comorbidities impacting occupational performance description: contractures of bilateral hands, dependent transfers,    Modification or Assistance to Complete Evaluation  No modification of tasks or assist necessary to complete eval    OT Frequency 2x / week    OT Duration 12 weeks    OT Treatment/Interventions Self-care/ADL training;Cryotherapy;Therapeutic exercise;DME and/or AE instruction;Balance training;Neuromuscular education;Manual Therapy;Splinting;Moist Heat;Passive range of motion;Therapeutic activities;Patient/family education    Plan continue to progress ROM of digits, wrists and shoulders as it pertains to completion of ADL tasks that pt values.    Consulted and Agree with Plan of Care Patient             Patient will benefit from skilled therapeutic intervention in order to improve the following deficits and impairments:   Body Structure / Function / Physical Skills: ADL, Continence, Dexterity, Flexibility, Strength, ROM, Balance, Coordination, FMC, IADL, Endurance, UE functional use, Decreased knowledge of use of DME,  GMC   Psychosocial Skills: Coping Strategies   Visit Diagnosis: Muscle weakness (generalized)  Problem List Patient Active Problem List   Diagnosis Date Noted   Acute appendicitis with appendiceal abscess 05/30/2018   Hypocalcemia 02/15/2018   Dysuria 02/15/2018   Bilateral lower extremity edema 02/11/2018   Vaginal yeast infection 01/30/2018   Chest tightness 01/27/2018   Healthcare-associated pneumonia 01/25/2018   Pleural effusion, not elsewhere classified 01/25/2018   Acute deep vein thrombosis (DVT) of left lower extremity ([place]) 01/24/2018   Acute deep vein thrombosis (DVT) of right lower extremity ([place]) 01/24/2018   Chronic allergic rhinitis 01/24/2018   Depression with anxiety 01/24/2018   UTI due to Klebsiella species 01/24/2018   Protein-calorie malnutrition, severe ([place]) 01/24/2018   S/P insertion of IVC (inferior vena caval) filter 01/24/2018   Reactive depression    Hypokalemia    Leukocytosis    Essential hypertension    Trauma    Tetraparesis ([facility])    Neuropathic pain    Neurogenic bowel    Neurogenic bladder    Benign essential HTN    Acute blood loss anemia    Central cord syndrome at C6 level of cervical spinal cord ([place]) 11/29/2017   Central cord syndrome ([place]) 11/29/2017    Harrel Carina, MS, OTR/L 01/28/2021, 1:58 PM  [place] MAIN [facility] SERVICES [address] [place], [place], [address] Phone: [phone]   Fax:  [phone]  Name: Sherry [name] MRN: [id number] Date of Birth: Jul 20, 1934

## 2021-02-04 ENCOUNTER — Ambulatory Visit: Payer: Medicare HMO | Admitting: Occupational Therapy

## 2021-02-04 ENCOUNTER — Ambulatory Visit: Payer: Medicare HMO

## 2021-02-04 NOTE — Patient Instructions (Incomplete)
INTERVENTIONS:    Therapeutic activities:    Standing in // bars: Left AFO brace donned   1) 2 min 30 sec- VC and some initial physical assist  for erect posture. VC to continue to shift forward- hips over feet and maintain- requiring CGA/Min A    2) 2 min 30 sec- again improved erect posture and able to lateral weight shift without knee buckling at least 15 times.     3) 2 min 40 sec- Patient performed well again - able to hold position with hips anterior positioned over feet- abel to lateral weight shift x 15 reps as well.    4) 1 min 53 sec - Patient able to stand and then focus on raising 1 UE up off // bars at a time x 10 reps each. Then even to perform a few reps while raising both UE (100% WB through BLE's)   5)  1 min 18 sec- Patient was fatigued after 30 sec and began shaking but still able to hold herself up well with min assist. Stopped due to increased fatigue   Education provided throughout session via VC/TC and demonstration to facilitate movement at target joints and correct muscle activation for all testing and exercises performed.

## 2021-02-09 ENCOUNTER — Other Ambulatory Visit: Payer: Self-pay

## 2021-02-09 ENCOUNTER — Ambulatory Visit: Payer: Medicare HMO | Attending: Internal Medicine | Admitting: Occupational Therapy

## 2021-02-09 ENCOUNTER — Ambulatory Visit: Payer: Medicare HMO

## 2021-02-09 ENCOUNTER — Encounter: Payer: Self-pay | Admitting: Occupational Therapy

## 2021-02-09 DIAGNOSIS — R262 Difficulty in walking, not elsewhere classified: Secondary | ICD-10-CM | POA: Diagnosis present

## 2021-02-09 DIAGNOSIS — R2681 Unsteadiness on feet: Secondary | ICD-10-CM

## 2021-02-09 DIAGNOSIS — M6281 Muscle weakness (generalized): Secondary | ICD-10-CM | POA: Diagnosis present

## 2021-02-09 DIAGNOSIS — R269 Unspecified abnormalities of gait and mobility: Secondary | ICD-10-CM

## 2021-02-09 DIAGNOSIS — R278 Other lack of coordination: Secondary | ICD-10-CM | POA: Insufficient documentation

## 2021-02-09 NOTE — Therapy (Signed)
[facility] MAIN [facility] SERVICES [address] [place], [place], [address] Phone: [phone]   Fax:  [phone]  Physical Therapy Treatment  Patient Details  Name: Sherry [name] MRN: [id number] Date of Birth: April 30, 1934 No data recorded  Encounter Date: 02/09/2021   PT End of Session - 02/09/21 1151     Visit Number 121    Number of Visits 136    Date for PT Re-Evaluation 03/16/21    Authorization Type [employer] medicare FOTO performed by PT on eval (7/20), score 12, Progress note on 03/10/2020; PN on 08/20/2020    Authorization Time Period Recert 07/10/6293- 2/84/1324; PN on 4/0/1027; Recert 2/53/6644- 03/47/4259; PN on 5/63/8756; Recert 43/32/9518- 8/41/6606    PT Start Time 1146    PT Stop Time 1228    PT Time Calculation (min) 42 min    Equipment Utilized During Treatment Gait belt    Activity Tolerance Patient tolerated treatment well;Patient limited by fatigue;Patient limited by pain    Behavior During Therapy [facility] for tasks assessed/performed             Past Medical History:  Diagnosis Date   Acute blood loss anemia    Arthritis    Cancer ([place])    skin   Central cord syndrome at C6 level of cervical spinal cord ([place]) 11/29/2017   Hypertension    Protein-calorie malnutrition, severe ([place]) 01/24/2018   S/P insertion of IVC (inferior vena caval) filter 01/24/2018   Tetraparesis ([place])     Past Surgical History:  Procedure Laterality Date   ANTERIOR CERVICAL DECOMP/DISCECTOMY FUSION N/A 11/29/2017   Procedure: Cervical five-six, six-seven Anterior Cervical Decompression Fusion;  Surgeon: Judith Part, MD;  Location: [place];  Service: Neurosurgery;  Laterality: N/A;  Cervical five-six, six-seven Anterior Cervical Decompression Fusion   CATARACT EXTRACTION     IR IVC FILTER PLMT / S&I /IMG GUID/MOD SED  12/08/2017    There were no vitals filed for this visit.   Subjective Assessment - 02/09/21 1149     Subjective Patient  reports that she had a fall out of bed on Saturday (caregiver was assisting) - Denied any significant injury but does endorse some soreness in left proximal thigh/groin.    Pertinent History Pt is an 87 yo female that fell in 2019, fractured vertebrae in her neck and in her low back per family. 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 After hospital stay, pt discharged to [facility] for ~1 month, experienced severe UTI as well as bilateral DVT (IVC filter placed). Discharged to [facility], stayed for about a year, and then moved to [facility] in April 2021. Was receiving PT, but per family facility reported that did not have adequate equipment to maximize PT for her. Pt until about 1 month ago was practicing sit to stand transfers with 1-2 people, and working on static standing. Has a brace for L foot due to PF. Pt currently needs assistance with all ADLs (able to assist with donning/doffing her shirt), bed baths, and needs a hoyer lift for transfers. Able to drive power wheelchair without assistance.    Currently in Pain? Yes   Pain in left groin/hip with moving my leg but none at rest   Pain Location Groin    Pain Orientation Left    Pain Descriptors / Indicators Sore    Pain Type Acute pain    Pain Onset In the past 7 days    Pain Frequency Intermittent  Aggravating Factors  Moving my leg out to the side    Pain Relieving Factors Rest    Multiple Pain Sites No            INTERVENTIONS:   *Did not perform standing due to some acute left LE soreness with active movement   Therapeutic exercises:  Reclined exercises:  -B Heel slide 2 sets of 12 reps with AAROM -Hip adduction squeeze with ball- hold 5 sec x 10 reps x 2 sets -Hip abduction/add with min physical assist B LE 2 sets of 12 reps*mild increase Left hip abd -SLR- BLE with min physical assist-2 sets of 12 reps.  -Quad sets with 5 sec hold BLE- VC to hold right LE for 5 sec     -Seated exercises:   - Bilateral  Hip march with AAROM 2 sets x 15 reps - Left knee ext - 2 sets x 12 reps  - Right Knee ext- with AAROM then slow eccentric control 2 sets of 12 reps.  - Resistive ham curl on left LE with red theraband (partial ROM) 2 sets of 10 reps. AAROM on right ham curl - 2 sets of 10 reps.      Education provided throughout session via VC/TC and demonstration to facilitate movement at target joints and correct muscle activation for all testing and exercises performed.                        PT Education - 02/09/21 1151     Education Details Exercise technique    Person(s) Educated Patient    Methods Explanation;Demonstration;Tactile cues;Verbal cues    Comprehension Verbal cues required;Verbalized understanding;Returned demonstration;Tactile cues required;Need further instruction              PT Short Term Goals - 03/10/20 2211       PT SHORT TERM GOAL #1   Title The patient will perform initial HEP with minimum assistance in order to improve strength and function.    Baseline Patient demonstrating independence with initial HEP as of 03/10/2020 with no questions or difficulty.    Time 4    Period Weeks    Status Achieved    Target Date 02/11/20               PT Long Term Goals - 12/22/20 1155       PT LONG TERM GOAL #1   Title The patient will be compliant with finalized HEP with minimum assistance in preparation for self management and maintenance of condition.    Baseline 03/10/2020- Patient familiar with initial HEP and will keep goal active as HEP becomes more progressive including possible transfers and standing activities. 04/07/2020- Patient continues to report compliance with LE strengthening home exercises without questions or concerns. 04/21/2020- Patient able to verbalize and demonstrate good understanding of current HEP including some supine and seated LE strengthening exercises. Reviewed and patient performed 10 reps today with minimal cueing. Will  keep goal active to progress HEP as appropriate. 05/28/2020- patient reports continues to perform LE strengthening as able and some help from caregiver as able. No changes at this time to home program as patient is limited to supine/seated therex. 07/02/2020- Patient continues to report compliance with current supine and seated LE strengtheing home program and states no questions or concerns regarding her plan.    Time 12    Period Weeks    Status Achieved      PT LONG TERM GOAL #2  Title The patient will demonstrate at least 10 point improvement on FOTO score indicating an improved ability to perform functional activities.    Baseline on eval (/720) score 12; 10/22/19: 12; 12/10/19: 18, 12/13: 18: 04/07/2020- Will assess next visit. 07/02/2020- Will obtain next visit. 09/24/2020= Will obtain next visit. 11/12/2020= 12; 12/22/2020= 12    Time 12    Period Weeks    Status On-going    Target Date 03/16/21      PT LONG TERM GOAL #3   Title The patient will demonstrate lateral scooting  for assistance with transfers with minA to maximize independence.    Baseline 10/22/19: Pt requires modA+1 for lateral scooting; 12/10/19: modA+1, 12/13: modA-maxA, 12/27: modA-maxA, 03/10/2020- ModA-MaxA- increased verbal cues and visual demonstration. 04/07/2020- Continued Max assist to perform forward/lateral scoot. 04/21/2020- Patient was able to demo slight lateral scoot with CGA approx 12 in then fatigued requiring max assist- she is able to scoot forward by leaning without significant issues. 05/28/2020- While sitting in chair- patient able to perform forward and lateral scoot with VC/visual demo and increased time allowed- CGA today but not consistent yet- will keep goal active at this time to continue to focus on strengthening and improving technique. 07/02/2020- Patient able to demonstrate minimal ability to laterally shift weigh on mat table requiring max assist yet is able to forward scoot out to edge of mat table with min  Assist. 08/20/2020 - Patient was able to demonstrate minimal lateral scooting at edge of mat while holding feet still so they did not slip on tile surface. She continues to be able to demonstrate scoot forward and uses tilt option to recline to position safely back into seat of power wheelchair. 09/25/2020- Patient able to sit on mat and minimally scoot laterally but requires min/mod A for efficient scooting. Will end goal at this time due to plateau in progress with scooting over extended time.    Time 12    Period Weeks    Status Not Met      PT LONG TERM GOAL #4   Title The patient will demonstrate a squat pivot transfer with minimum assistance to maximize independence and mobility.    Baseline 10/22/19: unable to safely attempt at this time; 12/10/19: unable to safely attempt at this time, 01/14/20: unable to safety attempt at this time. 03/10/2020- Patient able to perform Stand pivot transfer with Maximal assist. 04/07/2020- Patient continues to require Max assist with sit to stand- will assist SPT next visit. 04/21/2020- Patient presents with Max assist to perform Sit to stand Transfer- unable to pivot or move Left LE well without difficulty. 07/02/2020= Max assist with SPT and minimal ability to pivot. 7/202/2022-Patient able to stand with minimal assist this visit (as good as CGA last visit) and able to move right foot to pivot and performing with moderate assist today. 09/24/2020- Patient able to perform sit to stand with Min assist from lift chair. Patient has demo inconsistency and will keep goal active.  12/17/2020- Goal is currently not appropriate as patient fractured her ankle and currently NWB. 12/22/2020=Goal is currently not appropriate as patient fractured her ankle and currently NWB.    Time 12    Period Weeks    Status On-going    Target Date 03/16/21      PT LONG TERM GOAL #5   Title The patient will demonstrate sitting without UE support for 2-5 minutes at EOB to improve participation and  maximize independence with ADLs.    Baseline 10/22/19:  Pt able to sit unsupported for at least 2 minutes at edge of bed. 04/21/2020- Patient continues to demo good sitting at edge of mat > 5 miin without difficulty or back support.    Time 12    Period Weeks    Status Achieved      Additional Long Term Goals   Additional Long Term Goals Yes      PT LONG TERM GOAL #6   Title Patient will demonstrate improved functional LE strength as seen by consistent ability to stand > 2 min (3 out of 4 trials) with Mod/Max A for improved LE strength with transfers.    Baseline 05/28/2020- Patient able to inconsistent stand 1-2 min right now in //bars with Mod/Max A. 07/02/2020- Patient was able to progress last 2 visit to standing using bilateral platform attachment between 1-2 min which is a progression from the parallel bars. 08/20/2020- Patient performed static standing with min- mod A using bilateral platform attachment on walker for 6 min 30 sec today with assist for trunk control and intermittent verbal cues for posture. 09/24/2020- Patient has consistently been able to stand >2 min in // bars and when using bilateral Platform walker. 12/23/2020= Will reopen this goal as patient should transition back to weightbearing and again practice standing in new cert.    Time 12    Period Weeks    Status Deferred    Target Date 03/16/21      PT LONG TERM GOAL #7   Title Patient/caregiver will demonstrate assisted transfer using [place] Lift consistently Independently for improved ability to transfer at home from lift chair to bed.    Baseline 09/24/2020- Patient requires hoyer lift (Dependent) for safe transfers.  12/17/2020- Patient currently still dependent on hoyer as she is currently non-weight bearing due to recent ankle fracture.    Time 12    Period Weeks    Status On-going    Target Date 03/16/21      PT LONG TERM GOAL #8   Title Patient will demo improved right LE quad strength as seen by ability to extend  knee to 20 deg from 0 or less while performing a Long arc quad (for improved terminal knee ext and more full ROM)  for improved ability to bearing weight with Right LE.    Baseline 12/22/2020=able to extend knee to approx 35 deg from zero during long arc quad    Time 12    Period Weeks    Status New    Target Date 03/16/20                   Plan - 02/09/21 1152     Clinical Impression Statement Patient presents with good motivation for today's session however did not try to stand secondary to patient presents with some soreness in left proximal thigh from fall out of bed over the weekend. She responded well with no increased pain with seated or reclined therex today. She performed well with AAROM on right and mostly Active on left LE. Pt will benefit from skilled physical therapy to increase mobility and strength to improve patient's quality of life    Personal Factors and Comorbidities Age;Time since onset of injury/illness/exacerbation;Comorbidity 3+;Fitness    Comorbidities HTN, quadriparesis, history of DVT, neurogenic bladder    Examination-Activity Limitations Bathing;Hygiene/Grooming;Squat;Bed Mobility;Lift;Bend;Stand;Engineer, manufacturing;Toileting;Self Feeding;Transfers;Continence;Sit;Dressing;Sleep;Carry    Examination-Participation Restrictions Church;Laundry;Cleaning;Medication Management;Community Activity;Meal Prep;Interpersonal Relationship    Stability/Clinical Decision Making Evolving/Moderate complexity    Rehab Potential Fair    PT  Frequency 2x / week    PT Duration 12 weeks    PT Treatment/Interventions ADLs/Self Care Home Management;Electrical Stimulation;Therapeutic activities;Wheelchair mobility training;Vasopneumatic Device;Joint Manipulations;Vestibular;Passive range of motion;Patient/family education;Therapeutic exercise;DME Instruction;Biofeedback;Aquatic Therapy;Moist Heat;Gait training;Balance training;Orthotic Fit/Training;Dry needling;Energy  conservation;Taping;Splinting;Neuromuscular re-education;Cryotherapy;Ultrasound;Functional mobility training    PT Next Visit Plan Continue with progressive standing/transfer training once MD approved    PT Home Exercise Plan no changes    Consulted and Agree with Plan of Care Patient             Patient will benefit from skilled therapeutic intervention in order to improve the following deficits and impairments:  Abnormal gait, Decreased balance, Decreased endurance, Decreased mobility, Difficulty walking, Hypomobility, Impaired sensation, Decreased range of motion, Improper body mechanics, Impaired perceived functional ability, Decreased activity tolerance, Decreased knowledge of use of DME, Decreased safety awareness, Decreased strength, Impaired flexibility, Impaired UE functional use, Postural dysfunction  Visit Diagnosis: Abnormality of gait and mobility  Difficulty in walking, not elsewhere classified  Muscle weakness (generalized)  Unsteadiness on feet     Problem List Patient Active Problem List   Diagnosis Date Noted   Acute appendicitis with appendiceal abscess 05/30/2018   Hypocalcemia 02/15/2018   Dysuria 02/15/2018   Bilateral lower extremity edema 02/11/2018   Vaginal yeast infection 01/30/2018   Chest tightness 01/27/2018   Healthcare-associated pneumonia 01/25/2018   Pleural effusion, not elsewhere classified 01/25/2018   Acute deep vein thrombosis (DVT) of left lower extremity ([place]) 01/24/2018   Acute deep vein thrombosis (DVT) of right lower extremity ([facility]) 01/24/2018   Chronic allergic rhinitis 01/24/2018   Depression with anxiety 01/24/2018   UTI due to Klebsiella species 01/24/2018   Protein-calorie malnutrition, severe ([place]) 01/24/2018   S/P insertion of IVC (inferior vena caval) filter 01/24/2018   Reactive depression    Hypokalemia    Leukocytosis    Essential hypertension    Trauma    Tetraparesis ([facility])    Neuropathic pain    Neurogenic  bowel    Neurogenic bladder    Benign essential HTN    Acute blood loss anemia    Central cord syndrome at C6 level of cervical spinal cord ([place]) 11/29/2017   Central cord syndrome ([place]) 11/29/2017    Lewis Moccasin, PT 02/09/2021, 6:06 PM  [place] MAIN [facility] SERVICES [address] [place], [place], [address] Phone: [phone]   Fax:  [phone]  Name: Sherry [name] MRN: [id number] Date of Birth: 14-Feb-1934

## 2021-02-09 NOTE — Therapy (Signed)
[place] MAIN [facility] SERVICES [address] [place], [place], [address] Phone: [phone]   Fax:  [phone]  Occupational Therapy Treatment  Patient Details  Name: Sherry [name] MRN: [id number] Date of Birth: 08-03-1934 No data recorded  Encounter Date: 02/09/2021   OT End of Session - 02/09/21 1149     Visit Number 98    Number of Visits 130    Date for OT Re-Evaluation 04/06/21    Authorization Time Period Progress report period starting 11/26/2020    OT Start Time 1102    OT Stop Time 1145    OT Time Calculation (min) 43 min    Activity Tolerance Patient tolerated treatment well    Behavior During Therapy [facility] for tasks assessed/performed             Past Medical History:  Diagnosis Date   Acute blood loss anemia    Arthritis    Cancer ([place])    skin   Central cord syndrome at C6 level of cervical spinal cord ([place]) 11/29/2017   Hypertension    Protein-calorie malnutrition, severe ([place]) 01/24/2018   S/P insertion of IVC (inferior vena caval) filter 01/24/2018   Tetraparesis ([place])     Past Surgical History:  Procedure Laterality Date   ANTERIOR CERVICAL DECOMP/DISCECTOMY FUSION N/A 11/29/2017   Procedure: Cervical five-six, six-seven Anterior Cervical Decompression Fusion;  Surgeon: Judith Part, MD;  Location: [place];  Service: Neurosurgery;  Laterality: N/A;  Cervical five-six, six-seven Anterior Cervical Decompression Fusion   CATARACT EXTRACTION     IR IVC FILTER PLMT / S&I /IMG GUID/MOD SED  12/08/2017    There were no vitals filed for this visit.   Subjective Assessment - 02/09/21 1149     Subjective  Pt. reports that she is doing well today.    Patient is accompanied by: Family member    Pertinent History Patient s/p fall November 29, 2017 resulting in diagnosis of central cord syndrome at C 6 level.  She has had therapy in multiple venues but with recent move to [facility], therapy staff recommended she  seek outpatient therapy for her needs.    Currently in Pain? No/denies             OT TREATMENT     Pt. tolerated bilateral shoulder flexion, extension, abduction, elbow flexion, extension forearm supination AAROM/AROM. PROM bilateral wrist extension, digit MP, PIP, and DIP flexion, and extension, thumb radial, and palmar abduction in conjunction with moist heat. Emphasis was placed on left wrist extension, bilateral thumb radial, and palmar abduction, and bilateral digit MP, PIP, and DIP flexion. Pt. worked on [employer], and reciprocal motion using the UBE while sitting for 8 min. with minimal resistance.   Pt. continues to make excellent progress, and is improving with reaching further with her BUEs in preparation for improving overhead ADLs, and haircare. Pt. was able to tolerate the UBE for 8 min. with the UBE elevated for increased ROM. Pt. tolerated ROM well without reports of pain, or discomfort. Pt. continues to present with bilateral wrist flexor tightness, and digit extensor tightness. Pt. continues to benefit from working on impoving ROM in order to work towards increasing engagement of her bilateral hands during ADLs, and IADL tasks.                        OT Education - 02/09/21 1149     Education Details BUE functioning, and functinal reaching  Methods Explanation;Demonstration;Verbal cues    Comprehension Verbalized understanding;Need further instruction;Verbal cues required                 OT Long Term Goals - 01/12/21 1201       OT LONG TERM GOAL #1   Title Patient and caregiver will demonstrate understanding of home exercise program for ROM.    Baseline Pt.'s current restorative aide is retiring at the end of December. Pt. continues to have a restorative aide rehab aide assist her wih exercises at [employer]. 8/8: Independently completes/directs HEP mulitple times/week    Time 12    Period Weeks    Status Partially Met    Target  Date 04/06/21      OT LONG TERM GOAL #2   Title Patient will demonstrate ability to don shirt with min assist from seated position.    Baseline Pt. is indepedent with a larger shirt, and continues to require increased time to complete,    Time 12    Period Weeks    Status Achieved      OT LONG TERM GOAL #3   Title Patient will demonstrate improved sitting balance at the edge of the bed to participate in self care tasks.    Baseline Pt. has an air mattress at [facility]    Time 12    Period Weeks    Status Deferred      OT LONG TERM GOAL #4   Title Patient will demonstrate improved composite finger flexion to hold deodorant to apply to underarms.    Baseline 01/12/2021:  Pt. is progressing, and is able to reach to grasp, and hold narrow cones. 10/20/2020: Pt. conitnues to make steady progress with bilateral digit flexion.Pt. continues to present with stiifness/tightness which is hindering pt.'s ability to formulate a full composite fist. Pt. continues to present with improving finger flexion of R hand for a partial gross grasp, but unable to grasp and hold items. pt. conitnues to present with limited digit flexion of L hand. Pt. has active left thumb abduction. 8/8: holds cup with 2 hands, continues to have limited composite finger flexion L worse than R.    Time 12    Period Weeks    Status On-going    Target Date 04/06/21      OT LONG TERM GOAL #5   Title Pt will complete self feeding with setup, use of universal cuff and minimal spillage.    Baseline inconsistent with feeding and increased spillage. Pt is able to use a fork without spillage. Pt. has spillage with a spoon.    Time 12    Period Weeks    Status Achieved    Target Date 12/10/19      OT LONG TERM GOAL #6   Title Patient will increase right UE ROM to comb the back of her hair with modified independence.    Baseline 01/12/2022: Pt. is able to comb her hair, reaching to the back of her hair.. 10/20/2020: Pt. continues to  progress wtith RRUE ROM, and is able to use a pic for the back of her head, however is unable to brush the back of her hair. 07/30/2020: Pt. is now able to comb the back of her hair with a pic, however continues to work on completing the task efficiently. 8/8: Achieves with great effort, has assistance for efficiancy    Time 12    Period Weeks    Status Partially Met    Target Date 04/06/21  OT LONG TERM GOAL #7   Title Pt. will independently, and efficiently send text messages on her phone.    Baseline 01/12/2022:: pt. is now able to type text messages on her phone. 10/20/2020: Pt. is now able to type a text message on the phone, however not efficiently taking increased time to complete. 07/30/2020: Pt. continues to present with limited BUE strength, and [facility] skills. 8/8: independtly drafts text messages, continues to work on accuracy and efficiancy 2/2 limited [facility]    Time 12    Period Weeks    Status Partially Met    Target Date 04/06/21      OT LONG TERM GOAL #8   Title Pt. will write, and sign her name with 100% legibility, and modified independence.    Baseline 01/12/2021: Pt. continnues to fill out her daily menu and complete puzzles. 10/20/2020: Pt. continues to make progress overall. Pt. continues to consistently fill out her daily menu, and writing for crossword puzzles. Pt. is able to maintain grasp on a wide width pen. [place] to work on [employer]. 8/8: writes daily, continues to work on improving speed    Time 12    Period Weeks    Status Partially Met    Target Date 04/06/21                   Plan - 02/09/21 1150     Clinical Impression Statement Pt. continues to make excellent progress, and is improving with reaching further with her BUEs in preparation for improving overhead ADLs, and haircare. Pt. was able to tolerate the UBE for 8 min. with the UBE elevated for increased ROM. Pt. tolerated ROM well without reports of pain, or discomfort. Pt. continues to  present with bilateral wrist flexor tightness, and digit extensor tightness. Pt. continues to benefit from working on impoving ROM in order to work towards increasing engagement of her bilateral hands during ADLs, and IADL tasks.   OT Occupational Profile and History Detailed Assessment- Review of Records and additional review of physical, cognitive, psychosocial history related to current functional performance    Occupational performance deficits (Please refer to evaluation for details): ADL's;IADL's;Leisure;Social Participation    Body Structure / Function / Physical Skills ADL;Continence;Dexterity;Flexibility;Strength;ROM;Balance;Coordination;FMC;IADL;Endurance;UE functional use;Decreased knowledge of use of DME;GMC    Psychosocial Skills Coping Strategies    Rehab Potential Fair    Clinical Decision Making Several treatment options, min-mod task modification necessary    Comorbidities impacting occupational performance description: contractures of bilateral hands, dependent transfers,    Modification or Assistance to Complete Evaluation  No modification of tasks or assist necessary to complete eval    OT Frequency 2x / week    OT Duration 12 weeks    OT Treatment/Interventions Self-care/ADL training;Cryotherapy;Therapeutic exercise;DME and/or AE instruction;Balance training;Neuromuscular education;Manual Therapy;Splinting;Moist Heat;Passive range of motion;Therapeutic activities;Patient/family education    Plan continue to progress ROM of digits, wrists and shoulders as it pertains to completion of ADL tasks that pt values.    Consulted and Agree with Plan of Care Patient             Patient will benefit from skilled therapeutic intervention in order to improve the following deficits and impairments:   Body Structure / Function / Physical Skills: ADL, Continence, Dexterity, Flexibility, Strength, ROM, Balance, Coordination, FMC, IADL, Endurance, UE functional use, Decreased knowledge of  use of DME, GMC   Psychosocial Skills: Coping Strategies   Visit Diagnosis: Muscle weakness (generalized)  Other lack of coordination  Problem List Patient Active Problem List   Diagnosis Date Noted   Acute appendicitis with appendiceal abscess 05/30/2018   Hypocalcemia 02/15/2018   Dysuria 02/15/2018   Bilateral lower extremity edema 02/11/2018   Vaginal yeast infection 01/30/2018   Chest tightness 01/27/2018   Healthcare-associated pneumonia 01/25/2018   Pleural effusion, not elsewhere classified 01/25/2018   Acute deep vein thrombosis (DVT) of left lower extremity ([place]) 01/24/2018   Acute deep vein thrombosis (DVT) of right lower extremity ([place]) 01/24/2018   Chronic allergic rhinitis 01/24/2018   Depression with anxiety 01/24/2018   UTI due to Klebsiella species 01/24/2018   Protein-calorie malnutrition, severe ([place]) 01/24/2018   S/P insertion of IVC (inferior vena caval) filter 01/24/2018   Reactive depression    Hypokalemia    Leukocytosis    Essential hypertension    Trauma    Tetraparesis ([facility])    Neuropathic pain    Neurogenic bowel    Neurogenic bladder    Benign essential HTN    Acute blood loss anemia    Central cord syndrome at C6 level of cervical spinal cord ([place]) 11/29/2017   Central cord syndrome ([place]) 11/29/2017    Harrel Carina, MS, OTR/L 02/09/2021, 11:51 AM  [place] [address] [place], [place], [address] Phone: [phone]   Fax:  [phone]  Name: Sherry [name] MRN: [id number] Date of Birth: 08-May-1934

## 2021-02-11 ENCOUNTER — Other Ambulatory Visit: Payer: Self-pay

## 2021-02-11 ENCOUNTER — Ambulatory Visit: Payer: Medicare HMO

## 2021-02-11 ENCOUNTER — Ambulatory Visit: Payer: Medicare HMO | Admitting: Occupational Therapy

## 2021-02-11 ENCOUNTER — Encounter: Payer: Self-pay | Admitting: Occupational Therapy

## 2021-02-11 DIAGNOSIS — R278 Other lack of coordination: Secondary | ICD-10-CM

## 2021-02-11 DIAGNOSIS — R262 Difficulty in walking, not elsewhere classified: Secondary | ICD-10-CM

## 2021-02-11 DIAGNOSIS — R2681 Unsteadiness on feet: Secondary | ICD-10-CM

## 2021-02-11 DIAGNOSIS — M6281 Muscle weakness (generalized): Secondary | ICD-10-CM

## 2021-02-11 DIAGNOSIS — R269 Unspecified abnormalities of gait and mobility: Secondary | ICD-10-CM

## 2021-02-11 NOTE — Therapy (Signed)
[place] MAIN [facility] SERVICES [address] [place], [place], [address] Phone: [phone]   Fax:  [phone]  Occupational Therapy Treatment  Patient Details  Name: Sherry [name] MRN: [id number] Date of Birth: 1934/05/28 No data recorded  Encounter Date: 02/11/2021   OT End of Session - 02/11/21 1226     Visit Number 99    Number of Visits 130    Date for OT Re-Evaluation 04/06/21    Authorization Time Period Progress report period starting 11/26/2020    OT Start Time 30    OT Stop Time 1145    OT Time Calculation (min) 45 min    Equipment Utilized During Treatment moist heat    Activity Tolerance Patient tolerated treatment well    Behavior During Therapy [facility] for tasks assessed/performed             Past Medical History:  Diagnosis Date   Acute blood loss anemia    Arthritis    Cancer ([place])    skin   Central cord syndrome at C6 level of cervical spinal cord ([place]) 11/29/2017   Hypertension    Protein-calorie malnutrition, severe ([place]) 01/24/2018   S/P insertion of IVC (inferior vena caval) filter 01/24/2018   Tetraparesis ([place])     Past Surgical History:  Procedure Laterality Date   ANTERIOR CERVICAL DECOMP/DISCECTOMY FUSION N/A 11/29/2017   Procedure: Cervical five-six, six-seven Anterior Cervical Decompression Fusion;  Surgeon: Judith Part, MD;  Location: [place];  Service: Neurosurgery;  Laterality: N/A;  Cervical five-six, six-seven Anterior Cervical Decompression Fusion   CATARACT EXTRACTION     IR IVC FILTER PLMT / S&I /IMG GUID/MOD SED  12/08/2017    There were no vitals filed for this visit.   Subjective Assessment - 02/11/21 1224     Subjective  Pt. reports she arrived early because tranportation had to bring another resident for an earlier appointment.    Patient is accompanied by: Family member    Currently in Pain? No/denies            OT TREATMENT     Pt. tolerated bilateral shoulder  flexion, extension, abduction, elbow flexion, extension forearm supination AAROM/AROM. PROM bilateral wrist extension, digit MP, PIP, and DIP flexion, and extension, thumb radial, and palmar abduction in conjunction with moist heat. Emphasis was placed on left wrist extension, bilateral thumb radial, and palmar abduction, and bilateral digit MP, PIP, and DIP flexion. Pt. performed 1# dowel ex., followed by 1.5# For UE strengthening secondary to weakness. Bilateral shoulder flexion, and elbow flexion/extension were performed. Pt. worked on [employer], and reciprocal motion using the UBE while sitting for 8 min. with minimal resistance.   Pt. continues to make excellent progress, and is improving with reaching further with her BUEs in preparation for improving overhead ADLs, and haircare. Pt. was able to tolerate the UBE for 8 min. with the UBE elevated for increased ROM. Pt. tolerated ROM well without reports of pain, or discomfort. Pt. continues to present with bilateral wrist flexor tightness, and digit extensor tightness. Pt. continues to benefit from working on impoving ROM in order to work towards increasing engagement of her bilateral hands during ADLs, and IADL tasks.                         OT Education - 02/11/21 1225     Education Details BUE functioning, and strengtheing    Methods Explanation;Demonstration;Verbal cues    Comprehension  Verbalized understanding;Need further instruction;Verbal cues required                 OT Long Term Goals - 01/12/21 1201       OT LONG TERM GOAL #1   Title Patient and caregiver will demonstrate understanding of home exercise program for ROM.    Baseline Pt.'s current restorative aide is retiring at the end of December. Pt. continues to have a restorative aide rehab aide assist her wih exercises at [facility]. 8/8: Independently completes/directs HEP mulitple times/week    Time 12    Period Weeks    Status Partially Met     Target Date 04/06/21      OT LONG TERM GOAL #2   Title Patient will demonstrate ability to don shirt with min assist from seated position.    Baseline Pt. is indepedent with a larger shirt, and continues to require increased time to complete,    Time 12    Period Weeks    Status Achieved      OT LONG TERM GOAL #3   Title Patient will demonstrate improved sitting balance at the edge of the bed to participate in self care tasks.    Baseline Pt. has an air mattress at [facility]    Time 12    Period Weeks    Status Deferred      OT LONG TERM GOAL #4   Title Patient will demonstrate improved composite finger flexion to hold deodorant to apply to underarms.    Baseline 01/12/2021:  Pt. is progressing, and is able to reach to grasp, and hold narrow cones. 10/20/2020: Pt. conitnues to make steady progress with bilateral digit flexion.Pt. continues to present with stiifness/tightness which is hindering pt.'s ability to formulate a full composite fist. Pt. continues to present with improving finger flexion of R hand for a partial gross grasp, but unable to grasp and hold items. pt. conitnues to present with limited digit flexion of L hand. Pt. has active left thumb abduction. 8/8: holds cup with 2 hands, continues to have limited composite finger flexion L worse than R.    Time 12    Period Weeks    Status On-going    Target Date 04/06/21      OT LONG TERM GOAL #5   Title Pt will complete self feeding with setup, use of universal cuff and minimal spillage.    Baseline inconsistent with feeding and increased spillage. Pt is able to use a fork without spillage. Pt. has spillage with a spoon.    Time 12    Period Weeks    Status Achieved    Target Date 12/10/19      OT LONG TERM GOAL #6   Title Patient will increase right UE ROM to comb the back of her hair with modified independence.    Baseline 01/12/2022: Pt. is able to comb her hair, reaching to the back of her hair.. 10/20/2020: Pt.  continues to progress wtith RRUE ROM, and is able to use a pic for the back of her head, however is unable to brush the back of her hair. 07/30/2020: Pt. is now able to comb the back of her hair with a pic, however continues to work on completing the task efficiently. 8/8: Achieves with great effort, has assistance for efficiancy    Time 12    Period Weeks    Status Partially Met    Target Date 04/06/21      OT LONG  TERM GOAL #7   Title Pt. will independently, and efficiently send text messages on her phone.    Baseline 01/12/2022:: pt. is now able to type text messages on her phone. 10/20/2020: Pt. is now able to type a text message on the phone, however not efficiently taking increased time to complete. 07/30/2020: Pt. continues to present with limited BUE strength, and [facility] skills. 8/8: independtly drafts text messages, continues to work on accuracy and efficiancy 2/2 limited [facility]    Time 12    Period Weeks    Status Partially Met    Target Date 04/06/21      OT LONG TERM GOAL #8   Title Pt. will write, and sign her name with 100% legibility, and modified independence.    Baseline 01/12/2021: Pt. continnues to fill out her daily menu and complete puzzles. 10/20/2020: Pt. continues to make progress overall. Pt. continues to consistently fill out her daily menu, and writing for crossword puzzles. Pt. is able to maintain grasp on a wide width pen. [place] to work on [employer]. 8/8: writes daily, continues to work on improving speed    Time 12    Period Weeks    Status Partially Met    Target Date 04/06/21                   Plan - 02/11/21 1226     Clinical Impression Statement Pt. continues to make excellent progress, and is improving with reaching further with her BUEs in preparation for improving overhead ADLs, and haircare. Pt. was able to tolerate the UBE for 8 min. with the UBE elevated for increased ROM. Pt. tolerated ROM well without reports of pain, or discomfort. Pt.  continues to present with bilateral wrist flexor tightness, and digit extensor tightness. Pt. continues to benefit from working on impoving ROM in order to work towards increasing engagement of her bilateral hands during ADLs, and IADL tasks.    OT Occupational Profile and History Detailed Assessment- Review of Records and additional review of physical, cognitive, psychosocial history related to current functional performance    Occupational performance deficits (Please refer to evaluation for details): ADL's;IADL's;Leisure;Social Participation    Body Structure / Function / Physical Skills ADL;Continence;Dexterity;Flexibility;Strength;ROM;Balance;Coordination;FMC;IADL;Endurance;UE functional use;Decreased knowledge of use of DME;GMC    Psychosocial Skills Coping Strategies    Rehab Potential Fair    Clinical Decision Making Several treatment options, min-mod task modification necessary    Comorbidities impacting occupational performance description: contractures of bilateral hands, dependent transfers,    Modification or Assistance to Complete Evaluation  No modification of tasks or assist necessary to complete eval    OT Frequency 2x / week    OT Duration 12 weeks    OT Treatment/Interventions Self-care/ADL training;Cryotherapy;Therapeutic exercise;DME and/or AE instruction;Balance training;Neuromuscular education;Manual Therapy;Splinting;Moist Heat;Passive range of motion;Therapeutic activities;Patient/family education    Plan continue to progress ROM of digits, wrists and shoulders as it pertains to completion of ADL tasks that pt values.    Consulted and Agree with Plan of Care Patient             Patient will benefit from skilled therapeutic intervention in order to improve the following deficits and impairments:   Body Structure / Function / Physical Skills: ADL, Continence, Dexterity, Flexibility, Strength, ROM, Balance, Coordination, FMC, IADL, Endurance, UE functional use, Decreased  knowledge of use of DME, GMC   Psychosocial Skills: Coping Strategies   Visit Diagnosis: Muscle weakness (generalized)  Other lack of coordination  Problem List Patient Active Problem List   Diagnosis Date Noted   Acute appendicitis with appendiceal abscess 05/30/2018   Hypocalcemia 02/15/2018   Dysuria 02/15/2018   Bilateral lower extremity edema 02/11/2018   Vaginal yeast infection 01/30/2018   Chest tightness 01/27/2018   Healthcare-associated pneumonia 01/25/2018   Pleural effusion, not elsewhere classified 01/25/2018   Acute deep vein thrombosis (DVT) of left lower extremity ([place]) 01/24/2018   Acute deep vein thrombosis (DVT) of right lower extremity ([place]) 01/24/2018   Chronic allergic rhinitis 01/24/2018   Depression with anxiety 01/24/2018   UTI due to Klebsiella species 01/24/2018   Protein-calorie malnutrition, severe ([place]) 01/24/2018   S/P insertion of IVC (inferior vena caval) filter 01/24/2018   Reactive depression    Hypokalemia    Leukocytosis    Essential hypertension    Trauma    Tetraparesis (HCC)    Neuropathic pain    Neurogenic bowel    Neurogenic bladder    Benign essential HTN    Acute blood loss anemia    Central cord syndrome at C6 level of cervical spinal cord ([place]) 11/29/2017   Central cord syndrome ([place]) 11/29/2017    Harrel Carina, MS, OTR/L 02/11/2021, 12:27 PM  [place] MAIN [facility] SERVICES [address] [place], [place], [address] Phone: [phone]   Fax:  [phone]  Name: LABRESHA MELLOR MRN: [id number] Date of Birth: [birth year]

## 2021-02-11 NOTE — Therapy (Signed)
[place] MAIN [facility] SERVICES [address] [place], [place], [address] Phone: [phone]   Fax:  [phone]  Physical Therapy Treatment  Patient Details  Name: Sherry [name] MRN: [id number] Date of Birth: [birth year] No data recorded  Encounter Date: 02/11/2021   PT End of Session - 02/11/21 1149     Visit Number 122    Number of Visits 136    Date for PT Re-Evaluation 03/16/21    Authorization Type [employer] FOTO performed by PT on eval (7/20), score 12, Progress note on 03/10/2020; PN on 08/20/2020    Authorization Time Period Recert 09/02/1570- 07/21/3557; PN on 08/04/1636; Recert 4/53/6468- 04/22/2246; PN on 2/50/0370; Recert 48/88/9169- 4/50/3888    PT Start Time 1146    PT Stop Time 1229    PT Time Calculation (min) 43 min    Equipment Utilized During Treatment Gait belt    Activity Tolerance Patient tolerated treatment well;Patient limited by fatigue;Patient limited by pain    Behavior During Therapy [facility] for tasks assessed/performed             Past Medical History:  Diagnosis Date   Acute blood loss anemia    Arthritis    Cancer ([place])    skin   Central cord syndrome at C6 level of cervical spinal cord ([place]) 11/29/2017   Hypertension    Protein-calorie malnutrition, severe ([place]) 01/24/2018   S/P insertion of IVC (inferior vena caval) filter 01/24/2018   Tetraparesis ([place])     Past Surgical History:  Procedure Laterality Date   ANTERIOR CERVICAL DECOMP/DISCECTOMY FUSION N/A 11/29/2017   Procedure: Cervical five-six, six-seven Anterior Cervical Decompression Fusion;  Surgeon: Judith Part, MD;  Location: [facility];  Service: Neurosurgery;  Laterality: N/A;  Cervical five-six, six-seven Anterior Cervical Decompression Fusion   CATARACT EXTRACTION     IR IVC FILTER PLMT / S&I /IMG GUID/MOD SED  12/08/2017    There were no vitals filed for this visit.   Subjective Assessment - 02/11/21 1137     Subjective Patient  reports feeling okay today but still some soreness after fall out of bed over weekend.    Pertinent History Pt is an 87 yo female that fell in 2019, fractured vertebrae in her neck and in her low back per family. 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 After hospital stay, pt discharged to [facility] for ~1 month, experienced severe UTI as well as bilateral DVT (IVC filter placed). Discharged to [facility], stayed for about a year, and then moved to [facility] in April 2021. Was receiving PT, but per family facility reported that did not have adequate equipment to maximize PT for her. Pt until about 1 month ago was practicing sit to stand transfers with 1-2 people, and working on static standing. Has a brace for L foot due to PF. Pt currently needs assistance with all ADLs (able to assist with donning/doffing her shirt), bed baths, and needs a hoyer lift for transfers. Able to drive power wheelchair without assistance.    Currently in Pain? Yes    Pain Score 2     Pain Location Leg    Pain Orientation Left    Pain Descriptors / Indicators Sore    Pain Onset In the past 7 days    Aggravating Factors  active Left LE movement    Pain Relieving Factors Rest    Multiple Pain Sites No  Therapeutic activities:   - Attempted standing in // bars x 4 attempts today- Patient unable to stand and maintain standing without Max assist today- complaining of some left hip soreness but more weakness today. Patient required max assist to stand.   Therapeutic exercises:   Reclined exercises:  -B Heel slide 2 sets of 12 reps with AAROM -Hip adduction squeeze hold 5 sec x 10 reps x 2 sets -Held Hip abd due to left groin/upper thigh pain today. -Quad sets with 5 sec hold BLE- VC to hold right LE for 5 sec     -Seated exercises:   - Bilateral Hip march with AAROM 2 sets x 12 reps - Left knee ext - 2 sets x 12 reps  - Right Knee ext- with AAROM then slow eccentric control 2 sets of  12 reps.  - Resistive ham curl on left LE with red theraband (partial ROM) 2 sets of 10 reps. AAROM on right ham curl - 2 sets of 10 reps.        Education provided throughout session via VC/TC and demonstration to facilitate movement at target joints and correct muscle activation for all testing and exercises performed.                            PT Education - 02/11/21 1138     Education Details Exercise technique    Person(s) Educated Patient    Methods Explanation    Comprehension Verbalized understanding;Returned demonstration;Verbal cues required;Tactile cues required;Need further instruction              PT Short Term Goals - 03/10/20 2211       PT SHORT TERM GOAL #1   Title The patient will perform initial HEP with minimum assistance in order to improve strength and function.    Baseline Patient demonstrating independence with initial HEP as of 03/10/2020 with no questions or difficulty.    Time 4    Period Weeks    Status Achieved    Target Date 02/11/20               PT Long Term Goals - 12/22/20 1155       PT LONG TERM GOAL #1   Title The patient will be compliant with finalized HEP with minimum assistance in preparation for self management and maintenance of condition.    Baseline 03/10/2020- Patient familiar with initial HEP and will keep goal active as HEP becomes more progressive including possible transfers and standing activities. 04/07/2020- Patient continues to report compliance with LE strengthening home exercises without questions or concerns. 04/21/2020- Patient able to verbalize and demonstrate good understanding of current HEP including some supine and seated LE strengthening exercises. Reviewed and patient performed 10 reps today with minimal cueing. Will keep goal active to progress HEP as appropriate. 05/28/2020- patient reports continues to perform LE strengthening as able and some help from caregiver as able. No changes at  this time to home program as patient is limited to supine/seated therex. 07/02/2020- Patient continues to report compliance with current supine and seated LE strengtheing home program and states no questions or concerns regarding her plan.    Time 12    Period Weeks    Status Achieved      PT LONG TERM GOAL #2   Title The patient will demonstrate at least 10 point improvement on FOTO score indicating an improved ability to perform functional activities.    Baseline  on eval (/720) score 12; 10/22/19: 12; 12/10/19: 18, 12/13: 18: 04/07/2020- Will assess next visit. 07/02/2020- Will obtain next visit. 09/24/2020= Will obtain next visit. 11/12/2020= 12; 12/22/2020= 12    Time 12    Period Weeks    Status On-going    Target Date 03/16/21      PT LONG TERM GOAL #3   Title The patient will demonstrate lateral scooting  for assistance with transfers with minA to maximize independence.    Baseline 10/22/19: Pt requires modA+1 for lateral scooting; 12/10/19: modA+1, 12/13: modA-maxA, 12/27: modA-maxA, 03/10/2020- ModA-MaxA- increased verbal cues and visual demonstration. 04/07/2020- Continued Max assist to perform forward/lateral scoot. 04/21/2020- Patient was able to demo slight lateral scoot with CGA approx 12 in then fatigued requiring max assist- she is able to scoot forward by leaning without significant issues. 05/28/2020- While sitting in chair- patient able to perform forward and lateral scoot with VC/visual demo and increased time allowed- CGA today but not consistent yet- will keep goal active at this time to continue to focus on strengthening and improving technique. 07/02/2020- Patient able to demonstrate minimal ability to laterally shift weigh on mat table requiring max assist yet is able to forward scoot out to edge of mat table with min Assist. 08/20/2020 - Patient was able to demonstrate minimal lateral scooting at edge of mat while holding feet still so they did not slip on tile surface. She continues to be  able to demonstrate scoot forward and uses tilt option to recline to position safely back into seat of power wheelchair. 09/25/2020- Patient able to sit on mat and minimally scoot laterally but requires min/mod A for efficient scooting. Will end goal at this time due to plateau in progress with scooting over extended time.    Time 12    Period Weeks    Status Not Met      PT LONG TERM GOAL #4   Title The patient will demonstrate a squat pivot transfer with minimum assistance to maximize independence and mobility.    Baseline 10/22/19: unable to safely attempt at this time; 12/10/19: unable to safely attempt at this time, 01/14/20: unable to safety attempt at this time. 03/10/2020- Patient able to perform Stand pivot transfer with Maximal assist. 04/07/2020- Patient continues to require Max assist with sit to stand- will assist SPT next visit. 04/21/2020- Patient presents with Max assist to perform Sit to stand Transfer- unable to pivot or move Left LE well without difficulty. 07/02/2020= Max assist with SPT and minimal ability to pivot. 7/202/2022-Patient able to stand with minimal assist this visit (as good as CGA last visit) and able to move right foot to pivot and performing with moderate assist today. 09/24/2020- Patient able to perform sit to stand with Min assist from lift chair. Patient has demo inconsistency and will keep goal active.  12/17/2020- Goal is currently not appropriate as patient fractured her ankle and currently NWB. 12/22/2020=Goal is currently not appropriate as patient fractured her ankle and currently NWB.    Time 12    Period Weeks    Status On-going    Target Date 03/16/21      PT LONG TERM GOAL #5   Title The patient will demonstrate sitting without UE support for 2-5 minutes at EOB to improve participation and maximize independence with ADLs.    Baseline 10/22/19: Pt able to sit unsupported for at least 2 minutes at edge of bed. 04/21/2020- Patient continues to demo good sitting at  edge of mat >  5 miin without difficulty or back support.    Time 12    Period Weeks    Status Achieved      Additional Long Term Goals   Additional Long Term Goals Yes      PT LONG TERM GOAL #6   Title Patient will demonstrate improved functional LE strength as seen by consistent ability to stand > 2 min (3 out of 4 trials) with Mod/Max A for improved LE strength with transfers.    Baseline 05/28/2020- Patient able to inconsistent stand 1-2 min right now in //bars with Mod/Max A. 07/02/2020- Patient was able to progress last 2 visit to standing using bilateral platform attachment between 1-2 min which is a progression from the parallel bars. 08/20/2020- Patient performed static standing with min- mod A using bilateral platform attachment on walker for 6 min 30 sec today with assist for trunk control and intermittent verbal cues for posture. 09/24/2020- Patient has consistently been able to stand >2 min in // bars and when using bilateral Platform walker. 12/23/2020= Will reopen this goal as patient should transition back to weightbearing and again practice standing in new cert.    Time 12    Period Weeks    Status Deferred    Target Date 03/16/21      PT LONG TERM GOAL #7   Title Patient/caregiver will demonstrate assisted transfer using [place] Lift consistently Independently for improved ability to transfer at home from lift chair to bed.    Baseline 09/24/2020- Patient requires hoyer lift (Dependent) for safe transfers.  12/17/2020- Patient currently still dependent on hoyer as she is currently non-weight bearing due to recent ankle fracture.    Time 12    Period Weeks    Status On-going    Target Date 03/16/21      PT LONG TERM GOAL #8   Title Patient will demo improved right LE quad strength as seen by ability to extend knee to 20 deg from 0 or less while performing a Long arc quad (for improved terminal knee ext and more full ROM)  for improved ability to bearing weight with Right LE.     Baseline 12/22/2020=able to extend knee to approx 35 deg from zero during long arc quad    Time 12    Period Weeks    Status New    Target Date 03/16/20                   Plan - 02/11/21 1149     Clinical Impression Statement Patient arrived with good motivation today. She was agreeable to stand today and performed multiple attempts- Unable to stand without Max assist which is new for her and unable to extend hips. Patient did report some soreness in left thigh but mostly just weakness. Reverted back to performing reclined and seated therex without any report of increased pain. Patient did present overall with more fatigue than usual during last month of visits. Discussed that she may need to contact MD if pain does not improve. Pt will benefit from skilled physical therapy to increase mobility and strength to improve patient's quality of life    Personal Factors and Comorbidities Age;Time since onset of injury/illness/exacerbation;Comorbidity 3+;Fitness    Comorbidities HTN, quadriparesis, history of DVT, neurogenic bladder    Examination-Activity Limitations Bathing;Hygiene/Grooming;Squat;Bed Mobility;Lift;Bend;Stand;Engineer, manufacturing;Toileting;Self Feeding;Transfers;Continence;Sit;Dressing;Sleep;Carry    Examination-Participation Restrictions Church;Laundry;Cleaning;Medication Management;Community Activity;Meal Prep;Interpersonal Relationship    Stability/Clinical Decision Making Evolving/Moderate complexity    Rehab Potential Fair    PT  Frequency 2x / week    PT Duration 12 weeks    PT Treatment/Interventions ADLs/Self Care Home Management;Electrical Stimulation;Therapeutic activities;Wheelchair mobility training;Vasopneumatic Device;Joint Manipulations;Vestibular;Passive range of motion;Patient/family education;Therapeutic exercise;DME Instruction;Biofeedback;Aquatic Therapy;Moist Heat;Gait training;Balance training;Orthotic Fit/Training;Dry needling;Energy  conservation;Taping;Splinting;Neuromuscular re-education;Cryotherapy;Ultrasound;Functional mobility training    PT Next Visit Plan Continue with progressive standing/transfer training once MD approved    PT Home Exercise Plan no changes    Consulted and Agree with Plan of Care Patient             Patient will benefit from skilled therapeutic intervention in order to improve the following deficits and impairments:  Abnormal gait, Decreased balance, Decreased endurance, Decreased mobility, Difficulty walking, Hypomobility, Impaired sensation, Decreased range of motion, Improper body mechanics, Impaired perceived functional ability, Decreased activity tolerance, Decreased knowledge of use of DME, Decreased safety awareness, Decreased strength, Impaired flexibility, Impaired UE functional use, Postural dysfunction  Visit Diagnosis: Abnormality of gait and mobility  Difficulty in walking, not elsewhere classified  Muscle weakness (generalized)  Unsteadiness on feet     Problem List Patient Active Problem List   Diagnosis Date Noted   Acute appendicitis with appendiceal abscess 05/30/2018   Hypocalcemia 02/15/2018   Dysuria 02/15/2018   Bilateral lower extremity edema 02/11/2018   Vaginal yeast infection 01/30/2018   Chest tightness 01/27/2018   Healthcare-associated pneumonia 01/25/2018   Pleural effusion, not elsewhere classified 01/25/2018   Acute deep vein thrombosis (DVT) of left lower extremity ([place]) 01/24/2018   Acute deep vein thrombosis (DVT) of right lower extremity ([facility]) 01/24/2018   Chronic allergic rhinitis 01/24/2018   Depression with anxiety 01/24/2018   UTI due to Klebsiella species 01/24/2018   Protein-calorie malnutrition, severe ([place]) 01/24/2018   S/P insertion of IVC (inferior vena caval) filter 01/24/2018   Reactive depression    Hypokalemia    Leukocytosis    Essential hypertension    Trauma    Tetraparesis ([facility])    Neuropathic pain    Neurogenic  bowel    Neurogenic bladder    Benign essential HTN    Acute blood loss anemia    Central cord syndrome at C6 level of cervical spinal cord ([place]) 11/29/2017   Central cord syndrome ([place]) 11/29/2017    Lewis Moccasin, PT 02/12/2021, 2:35 PM  [place] MAIN [facility] SERVICES [address] [place], [place], [address] Phone: [phone]   Fax:  [phone]  Name: Sherry [name] MRN: [id number] Date of Birth: [birth year]

## 2021-02-12 DIAGNOSIS — S32312A Displaced avulsion fracture of left ilium, initial encounter for closed fracture: Secondary | ICD-10-CM

## 2021-02-16 ENCOUNTER — Encounter: Payer: Self-pay | Admitting: Occupational Therapy

## 2021-02-16 ENCOUNTER — Ambulatory Visit: Payer: Medicare HMO

## 2021-02-16 ENCOUNTER — Other Ambulatory Visit: Payer: Self-pay

## 2021-02-16 ENCOUNTER — Ambulatory Visit: Payer: Medicare HMO | Admitting: Occupational Therapy

## 2021-02-16 DIAGNOSIS — M6281 Muscle weakness (generalized): Secondary | ICD-10-CM

## 2021-02-16 DIAGNOSIS — R269 Unspecified abnormalities of gait and mobility: Secondary | ICD-10-CM

## 2021-02-16 DIAGNOSIS — R262 Difficulty in walking, not elsewhere classified: Secondary | ICD-10-CM

## 2021-02-16 DIAGNOSIS — R2681 Unsteadiness on feet: Secondary | ICD-10-CM

## 2021-02-16 NOTE — Therapy (Signed)
Sherry [name] MAIN [facility] SERVICES [address] [place], [place], [address] Phone: [phone]   Fax:  [phone] Occupational Therapy Progress Note  Dates of reporting period  12/31/2020   to   02/16/2021   Patient Details  Name: Sherry [name] MRN: [id number] Date of Birth: [birth year] No data recorded  Encounter Date: 02/16/2021   OT End of Session - 02/16/21 1106     Visit Number 100    Number of Visits 130    Date for OT Re-Evaluation 04/06/21    Authorization Time Period Progress report period starting 12/31/2020   OT Start Time 75    OT Stop Time 1145    OT Time Calculation (min) 45 min    Equipment Utilized During Treatment moist heat    Activity Tolerance Patient tolerated treatment well    Behavior During Therapy [facility] for tasks assessed/performed             Past Medical History:  Diagnosis Date   Acute blood loss anemia    Arthritis    Cancer ([place])    skin   Central cord syndrome at C6 level of cervical spinal cord ([place]) 11/29/2017   Hypertension    Protein-calorie malnutrition, severe ([place]) 01/24/2018   S/P insertion of IVC (inferior vena caval) filter 01/24/2018   Tetraparesis ([place])     Past Surgical History:  Procedure Laterality Date   ANTERIOR CERVICAL DECOMP/DISCECTOMY FUSION N/A 11/29/2017   Procedure: Cervical five-six, six-seven Anterior Cervical Decompression Fusion;  Surgeon: Judith Part, MD;  Location: [place];  Service: Neurosurgery;  Laterality: N/A;  Cervical five-six, six-seven Anterior Cervical Decompression Fusion   CATARACT EXTRACTION     IR IVC FILTER PLMT / S&I /IMG GUID/MOD SED  12/08/2017    There were no vitals filed for this visit.   Subjective Assessment - 02/16/21 1325     Subjective  Pt. reports doing well today.    Patient is accompanied by: Family member    Pertinent History Patient s/p fall November 29, 2017 resulting in diagnosis of central cord syndrome at C 6 level.  She  has had therapy in multiple venues but with recent move to [facility], therapy staff recommended she seek outpatient therapy for her needs.    Currently in Pain? No/denies             OT TREATMENT     Pt. tolerated bilateral AAROM/AROM/ PROM bilateral wrist extension, digit MP, PIP, and DIP flexion, and extension, thumb radial, and palmar abduction in conjunction with moist heat.    Goals were reviewed with the patient. Pt. continues to make progress, and is improving with reaching further with her BUEs in preparation for improving overhead ADLs. Pt. Is now able to reach the back of her hair for brushing. Pt. Reports that she prefers to have someone assist her with it, as she can't see behind her. Pt. Is improving with writing, and is able to complete her menu, and word searches. Pt. Has difficulty with completing crossword puzzles legibly. Pt. Is now able to send text messages, and manage her phone efficiently. Pt. Has a ne restorative aide, who will be resuming her ROM program at [facility]. Pt. tolerated ROM well without reports of pain, or discomfort. Pt. continues to present with bilateral wrist flexor tightness, and digit extensor tightness. Pt. continues to benefit from working on impoving ROM in order to work towards increasing engagement of her bilateral hands during ADLs, and IADL tasks.  OT Education - 02/16/21 1106     Education Details BUE functioning, and strengtheing    Person(s) Educated Patient;Caregiver(s)    Methods Explanation;Demonstration;Verbal cues    Comprehension Verbalized understanding;Need further instruction;Verbal cues required                 OT Long Term Goals - 02/16/21 1111       OT LONG TERM GOAL #1   Title Patient and caregiver will demonstrate understanding of home exercise program for ROM.    Baseline Pt.'s current restorative aide is retiring at the end of December. Pt. continues to have a restorative  aide rehab aide assist her wih exercises at [facility]. 8/8: Independently completes/directs HEP mulitple times/week. 12/17/2020: Pt. has a new restorative aide who will be resuming ROM program.    Time 12    Period Weeks    Status On-going    Target Date 04/06/21      OT LONG TERM GOAL #2   Title Patient will demonstrate ability to don shirt with min assist from seated position.      OT LONG TERM GOAL #4   Title Patient will demonstrate improved composite finger flexion to hold deodorant to apply to underarms.    Baseline 01/12/2021:  Pt. is progressing, and is able to reach to grasp, and hold narrow cones. 10/20/2020: Pt. conitnues to make steady progress with bilateral digit flexion.Pt. continues to present with stiifness/tightness which is hindering pt.'s ability to formulate a full composite fist. Pt. continues to present with improving finger flexion of R hand for a partial gross grasp, but unable to grasp and hold items. pt. conitnues to present with limited digit flexion of L hand. Pt. has active left thumb abduction. 8/8: holds cup with 2 hands, continues to have limited composite finger flexion L worse than R. 02/16/2021: pt. continues to present with limited bilateral gross composite fisting.    Time 12    Period Weeks    Status On-going    Target Date 04/06/21      OT LONG TERM GOAL #6   Title Patient will increase right UE ROM to comb the back of her hair with modified independence.    Baseline 01/12/2022: Pt. is able to comb her hair, reaching to the back of her hair.. 10/20/2020: Pt. continues to progress wtith RRUE ROM, and is able to use a pic for the back of her head, however is unable to brush the back of her hair. 07/30/2020: Pt. is now able to comb the back of her hair with a pic, however continues to work on completing the task efficiently. 8/8: Achieves with great effort, has assistance for efficiancy. 02/16/2021: Pt. is now able to reach, and brush the back of her hair.    Time  12    Status Partially Met    Target Date 04/06/21      OT LONG TERM GOAL #7   Title Pt. will independently, and efficiently send text messages on her phone.    Baseline 01/12/2022:: pt. is now able to type text messages on her phone. 10/20/2020: Pt. is now able to type a text message on the phone, however not efficiently taking increased time to complete. 07/30/2020: Pt. continues to present with limited BUE strength, and [facility] skills. 8/8: independtly drafts text messages, continues to work on accuracy and efficiancy 2/2 limited FMC.02/16/2021: pt. is now able to accessher phones,efficiently, and send text messages.    Time 12    Period Weeks  Status Achieved      OT LONG TERM GOAL #8   Title Pt. will write, and sign her name with 100% legibility, and modified independence.    Baseline 01/12/2021: Pt. continnues to fill out her daily menu and complete puzzles. 10/20/2020: Pt. continues to make progress overall. Pt. continues to consistently fill out her daily menu, and writing for crossword puzzles. Pt. is able to maintain grasp on a wide width pen. Contineus to work on Media planner. 8/8: writes daily, continues to work on improving speed. 02/16/2021: Pt. continues to present wiht limited writing legibility, fluctuating daily.    Time 12    Period Weeks    Status Partially Met    Target Date 04/06/21                   Plan - 02/16/21 1326     Clinical Impression Statement Goals were reviewed with the patient. Pt. continues to make progress, and is improving with reaching further with her BUEs in preparation for improving overhead ADLs. Pt. Is now able to reach the back of her hair for brushing. Pt. Reports that she prefers to have someone assist her with it, as she can't see behind her. Pt. Is improving with writing, and is able to complete her menu, and word searches. Pt. Has difficulty with completing crossword puzzles legibly. Pt. Is now able to send text messages, and manage  her phone efficiently. Pt. Has a ne restorative aide, who will be resuming her ROM program at [facility]. Pt. tolerated ROM well without reports of pain, or discomfort. Pt. continues to present with bilateral wrist flexor tightness, and digit extensor tightness. Pt. continues to benefit from working on impoving ROM in order to work towards increasing engagement of her bilateral hands during ADLs, and IADL tasks.     OT Occupational Profile and History Detailed Assessment- Review of Records and additional review of physical, cognitive, psychosocial history related to current functional performance    Occupational performance deficits (Please refer to evaluation for details): ADL's;IADL's;Leisure;Social Participation    Body Structure / Function / Physical Skills ADL;Continence;Dexterity;Flexibility;Strength;ROM;Balance;Coordination;FMC;IADL;Endurance;UE functional use;Decreased knowledge of use of DME;GMC    Psychosocial Skills Coping Strategies    Rehab Potential Fair    Clinical Decision Making Several treatment options, min-mod task modification necessary    Comorbidities impacting occupational performance description: contractures of bilateral hands, dependent transfers,    Modification or Assistance to Complete Evaluation  No modification of tasks or assist necessary to complete eval    OT Frequency 2x / week    OT Duration 12 weeks    OT Treatment/Interventions Self-care/ADL training;Cryotherapy;Therapeutic exercise;DME and/or AE instruction;Balance training;Neuromuscular education;Manual Therapy;Splinting;Moist Heat;Passive range of motion;Therapeutic activities;Patient/family education    Plan continue to progress ROM of digits, wrists and shoulders as it pertains to completion of ADL tasks that pt values.    Consulted and Agree with Plan of Care Patient             Patient will benefit from skilled therapeutic intervention in order to improve the following deficits and impairments:    Body Structure / Function / Physical Skills: ADL, Continence, Dexterity, Flexibility, Strength, ROM, Balance, Coordination, FMC, IADL, Endurance, UE functional use, Decreased knowledge of use of DME, GMC   Psychosocial Skills: Coping Strategies   Visit Diagnosis: Muscle weakness (generalized)    Problem List Patient Active Problem List   Diagnosis Date Noted   Acute appendicitis with appendiceal abscess 05/30/2018   Hypocalcemia 02/15/2018  Dysuria 02/15/2018   Bilateral lower extremity edema 02/11/2018   Vaginal yeast infection 01/30/2018   Chest tightness 01/27/2018   Healthcare-associated pneumonia 01/25/2018   Pleural effusion, not elsewhere classified 01/25/2018   Acute deep vein thrombosis (DVT) of left lower extremity ([place]) 01/24/2018   Acute deep vein thrombosis (DVT) of right lower extremity ([place]) 01/24/2018   Chronic allergic rhinitis 01/24/2018   Depression with anxiety 01/24/2018   UTI due to Klebsiella species 01/24/2018   Protein-calorie malnutrition, severe ([place]) 01/24/2018   S/P insertion of IVC (inferior vena caval) filter 01/24/2018   Reactive depression    Hypokalemia    Leukocytosis    Essential hypertension    Trauma    Tetraparesis ([facility])    Neuropathic pain    Neurogenic bowel    Neurogenic bladder    Benign essential HTN    Acute blood loss anemia    Central cord syndrome at C6 level of cervical spinal cord ([place]) 11/29/2017   Central cord syndrome ([place]) 11/29/2017    Harrel Carina, MS, OTR/L 02/16/2021, 1:27 PM  [place] MAIN [facility] SERVICES [address] [place], [place], [address] Phone: [phone]   Fax:  [phone]  Name: Sherry [name] MRN: [id number] Date of Birth: 1934/11/17

## 2021-02-16 NOTE — Therapy (Signed)
[place] MAIN [facility] SERVICES [address] [place], [place], [address] Phone: [phone]   Fax:  [phone]  Physical Therapy Treatment  Patient Details  Name: Sherry [name] MRN: [id number] Date of Birth: Apr 27, 1934 No data recorded  Encounter Date: 02/16/2021   PT End of Session - 02/16/21 1158     Visit Number [id number]    Number of Visits 136    Date for PT Re-Evaluation 03/16/21    Authorization Type [employer]are FOTO performed by PT on eval (7/20), score 12, Progress note on 03/10/2020; PN on 08/20/2020    Authorization Time Period Recert 05/04/5954- 3/87/5643; PN on 04/02/9516; Recert 8/41/6606- 30/16/0109; PN on 04/24/5571; Recert 22/03/5425- 0/62/3762    PT Start Time 1145    PT Stop Time 1227    PT Time Calculation (min) 42 min    Equipment Utilized During Treatment Gait belt    Activity Tolerance Patient tolerated treatment well;Patient limited by fatigue;Patient limited by pain    Behavior During Therapy [facility] for tasks assessed/performed             Past Medical History:  Diagnosis Date   Acute blood loss anemia    Arthritis    Cancer ([place])    skin   Central cord syndrome at C6 level of cervical spinal cord ([place]) 11/29/2017   Hypertension    Protein-calorie malnutrition, severe ([place]) 01/24/2018   S/P insertion of IVC (inferior vena caval) filter 01/24/2018   Tetraparesis ([place])     Past Surgical History:  Procedure Laterality Date   ANTERIOR CERVICAL DECOMP/DISCECTOMY FUSION N/A 11/29/2017   Procedure: Cervical five-six, six-seven Anterior Cervical Decompression Fusion;  Surgeon: Judith Part, MD;  Location: [place];  Service: Neurosurgery;  Laterality: N/A;  Cervical five-six, six-seven Anterior Cervical Decompression Fusion   CATARACT EXTRACTION     IR IVC FILTER PLMT / S&I /IMG GUID/MOD SED  12/08/2017    There were no vitals filed for this visit.   Subjective Assessment - 02/16/21 1156     Subjective Patient  reports not being up for standing and states she did get an xray last Wednesday- Stating a "bone chip in left hip"    Pertinent History Pt is an 87 yo female that fell in 2019, fractured vertebrae in her neck and in her low back per family. 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 After hospital stay, pt discharged to [facility] for ~1 month, experienced severe UTI as well as bilateral DVT (IVC filter placed). Discharged to [facility], stayed for about a year, and then moved to [facility] in April 2021. Was receiving PT, but per family facility reported that did not have adequate equipment to maximize PT for her. Pt until about 1 month ago was practicing sit to stand transfers with 1-2 people, and working on static standing. Has a brace for L foot due to PF. Pt currently needs assistance with all ADLs (able to assist with donning/doffing her shirt), bed baths, and needs a hoyer lift for transfers. Able to drive power wheelchair without assistance.    Currently in Pain? Yes    Pain Score 2     Pain Location Hip    Pain Orientation Left    Pain Type Acute pain    Pain Onset 1 to 4 weeks ago    Pain Frequency Intermittent    Aggravating Factors  active Left LE movement    Pain Relieving Factors Rest    Effect of Pain  on Daily Activities painful with bed mobility               INTERVENTIONS:        Therapeutic exercises:   Reclined exercises:  -B Heel slide 2 sets of 12 reps with AAROM -Hip adduction squeeze with ball- hold 5 sec x 10 reps x 2 sets -Hip abduction/add with min physical assist B LE 2 sets of 12 reps*Modified to stay within painfree motion -Gluteal Sets hold 5 sec x 10 reps -Quad sets with 5 sec hold BLE x 10 reps    -Seated exercises:   - Bilateral Hip march with AAROM 2 sets x 15 reps - Left knee ext - 2 sets x 12 reps AROM  - Right Knee ext- with AAROM then slow eccentric control 2 sets of 12 reps.  - Manually Resistive lumbar flex into abd curl- 2  sets of 10 reps.     Education provided throughout session via VC/TC and demonstration to facilitate movement at target joints and correct muscle activation for all testing and exercises performed.                                   PT Education - 02/16/21 1157     Education Details Exercise technique    Person(s) Educated Patient    Methods Explanation;Demonstration;Tactile cues;Verbal cues    Comprehension Verbalized understanding;Returned demonstration;Verbal cues required;Tactile cues required;Need further instruction              PT Short Term Goals - 03/10/20 2211       PT SHORT TERM GOAL #1   Title The patient will perform initial HEP with minimum assistance in order to improve strength and function.    Baseline Patient demonstrating independence with initial HEP as of 03/10/2020 with no questions or difficulty.    Time 4    Period Weeks    Status Achieved    Target Date 02/11/20               PT Long Term Goals - 12/22/20 1155       PT LONG TERM GOAL #1   Title The patient will be compliant with finalized HEP with minimum assistance in preparation for self management and maintenance of condition.    Baseline 03/10/2020- Patient familiar with initial HEP and will keep goal active as HEP becomes more progressive including possible transfers and standing activities. 04/07/2020- Patient continues to report compliance with LE strengthening home exercises without questions or concerns. 04/21/2020- Patient able to verbalize and demonstrate good understanding of current HEP including some supine and seated LE strengthening exercises. Reviewed and patient performed 10 reps today with minimal cueing. Will keep goal active to progress HEP as appropriate. 05/28/2020- patient reports continues to perform LE strengthening as able and some help from caregiver as able. No changes at this time to home program as patient is limited to supine/seated therex.  07/02/2020- Patient continues to report compliance with current supine and seated LE strengtheing home program and states no questions or concerns regarding her plan.    Time 12    Period Weeks    Status Achieved      PT LONG TERM GOAL #2   Title The patient will demonstrate at least 10 point improvement on FOTO score indicating an improved ability to perform functional activities.    Baseline on eval (/720) score 12; 10/22/19: 12; 12/10/19: 18, 12/13:  18: 04/07/2020- Will assess next visit. 07/02/2020- Will obtain next visit. 09/24/2020= Will obtain next visit. 11/12/2020= 12; 12/22/2020= 12    Time 12    Period Weeks    Status On-going    Target Date 03/16/21      PT LONG TERM GOAL #3   Title The patient will demonstrate lateral scooting  for assistance with transfers with minA to maximize independence.    Baseline 10/22/19: Pt requires modA+1 for lateral scooting; 12/10/19: modA+1, 12/13: modA-maxA, 12/27: modA-maxA, 03/10/2020- ModA-MaxA- increased verbal cues and visual demonstration. 04/07/2020- Continued Max assist to perform forward/lateral scoot. 04/21/2020- Patient was able to demo slight lateral scoot with CGA approx 12 in then fatigued requiring max assist- she is able to scoot forward by leaning without significant issues. 05/28/2020- While sitting in chair- patient able to perform forward and lateral scoot with VC/visual demo and increased time allowed- CGA today but not consistent yet- will keep goal active at this time to continue to focus on strengthening and improving technique. 07/02/2020- Patient able to demonstrate minimal ability to laterally shift weigh on mat table requiring max assist yet is able to forward scoot out to edge of mat table with min Assist. 08/20/2020 - Patient was able to demonstrate minimal lateral scooting at edge of mat while holding feet still so they did not slip on tile surface. She continues to be able to demonstrate scoot forward and uses tilt option to recline to  position safely back into seat of power wheelchair. 09/25/2020- Patient able to sit on mat and minimally scoot laterally but requires min/mod A for efficient scooting. Will end goal at this time due to plateau in progress with scooting over extended time.    Time 12    Period Weeks    Status Not Met      PT LONG TERM GOAL #4   Title The patient will demonstrate a squat pivot transfer with minimum assistance to maximize independence and mobility.    Baseline 10/22/19: unable to safely attempt at this time; 12/10/19: unable to safely attempt at this time, 01/14/20: unable to safety attempt at this time. 03/10/2020- Patient able to perform Stand pivot transfer with Maximal assist. 04/07/2020- Patient continues to require Max assist with sit to stand- will assist SPT next visit. 04/21/2020- Patient presents with Max assist to perform Sit to stand Transfer- unable to pivot or move Left LE well without difficulty. 07/02/2020= Max assist with SPT and minimal ability to pivot. 7/202/2022-Patient able to stand with minimal assist this visit (as good as CGA last visit) and able to move right foot to pivot and performing with moderate assist today. 09/24/2020- Patient able to perform sit to stand with Min assist from lift chair. Patient has demo inconsistency and will keep goal active.  12/17/2020- Goal is currently not appropriate as patient fractured her ankle and currently NWB. 12/22/2020=Goal is currently not appropriate as patient fractured her ankle and currently NWB.    Time 12    Period Weeks    Status On-going    Target Date 03/16/21      PT LONG TERM GOAL #5   Title The patient will demonstrate sitting without UE support for 2-5 minutes at EOB to improve participation and maximize independence with ADLs.    Baseline 10/22/19: Pt able to sit unsupported for at least 2 minutes at edge of bed. 04/21/2020- Patient continues to demo good sitting at edge of mat > 5 miin without difficulty or back support.  Time 12     Period Weeks    Status Achieved      Additional Long Term Goals   Additional Long Term Goals Yes      PT LONG TERM GOAL #6   Title Patient will demonstrate improved functional LE strength as seen by consistent ability to stand > 2 min (3 out of 4 trials) with Mod/Max A for improved LE strength with transfers.    Baseline 05/28/2020- Patient able to inconsistent stand 1-2 min right now in //bars with Mod/Max A. 07/02/2020- Patient was able to progress last 2 visit to standing using bilateral platform attachment between 1-2 min which is a progression from the parallel bars. 08/20/2020- Patient performed static standing with min- mod A using bilateral platform attachment on walker for 6 min 30 sec today with assist for trunk control and intermittent verbal cues for posture. 09/24/2020- Patient has consistently been able to stand >2 min in // bars and when using bilateral Platform walker. 12/23/2020= Will reopen this goal as patient should transition back to weightbearing and again practice standing in new cert.    Time 12    Period Weeks    Status Deferred    Target Date 03/16/21      PT LONG TERM GOAL #7   Title Patient/caregiver will demonstrate assisted transfer using [place] Lift consistently Independently for improved ability to transfer at home from lift chair to bed.    Baseline 09/24/2020- Patient requires hoyer lift (Dependent) for safe transfers.  12/17/2020- Patient currently still dependent on hoyer as she is currently non-weight bearing due to recent ankle fracture.    Time 12    Period Weeks    Status On-going    Target Date 03/16/21      PT LONG TERM GOAL #8   Title Patient will demo improved right LE quad strength as seen by ability to extend knee to 20 deg from 0 or less while performing a Long arc quad (for improved terminal knee ext and more full ROM)  for improved ability to bearing weight with Right LE.    Baseline 12/22/2020=able to extend knee to approx 35 deg from zero during  long arc quad    Time 12    Period Weeks    Status New    Target Date 03/16/20                   Plan - 02/16/21 1212     Clinical Impression Statement Treatment again limited secondary to patient injured her left hip during recent fall. She was able to modify her exercises and performed well overall yet unable to progress to standing or transfer activities at this time. Pt will benefit from skilled physical therapy to increase mobility and strength to improve patient's quality of life    Personal Factors and Comorbidities Age;Time since onset of injury/illness/exacerbation;Comorbidity 3+;Fitness    Comorbidities HTN, quadriparesis, history of DVT, neurogenic bladder    Examination-Activity Limitations Bathing;Hygiene/Grooming;Squat;Bed Mobility;Lift;Bend;Stand;Engineer, manufacturing;Toileting;Self Feeding;Transfers;Continence;Sit;Dressing;Sleep;Carry    Examination-Participation Restrictions Church;Laundry;Cleaning;Medication Management;Community Activity;Meal Prep;Interpersonal Relationship    Stability/Clinical Decision Making Evolving/Moderate complexity    Rehab Potential Fair    PT Frequency 2x / week    PT Duration 12 weeks    PT Treatment/Interventions ADLs/Self Care Home Management;Electrical Stimulation;Therapeutic activities;Wheelchair mobility training;Vasopneumatic Device;Joint Manipulations;Vestibular;Passive range of motion;Patient/family education;Therapeutic exercise;DME Instruction;Biofeedback;Aquatic Therapy;Moist Heat;Gait training;Balance training;Orthotic Fit/Training;Dry needling;Energy conservation;Taping;Splinting;Neuromuscular re-education;Cryotherapy;Ultrasound;Functional mobility training    PT Next Visit Plan Continue with progressive standing/transfer training once MD approved  PT Home Exercise Plan no changes    Consulted and Agree with Plan of Care Patient             Patient will benefit from skilled therapeutic intervention in order  to improve the following deficits and impairments:  Abnormal gait, Decreased balance, Decreased endurance, Decreased mobility, Difficulty walking, Hypomobility, Impaired sensation, Decreased range of motion, Improper body mechanics, Impaired perceived functional ability, Decreased activity tolerance, Decreased knowledge of use of DME, Decreased safety awareness, Decreased strength, Impaired flexibility, Impaired UE functional use, Postural dysfunction  Visit Diagnosis: Abnormality of gait and mobility  Difficulty in walking, not elsewhere classified  Muscle weakness (generalized)  Unsteadiness on feet     Problem List Patient Active Problem List   Diagnosis Date Noted   Acute appendicitis with appendiceal abscess 05/30/2018   Hypocalcemia 02/15/2018   Dysuria 02/15/2018   Bilateral lower extremity edema 02/11/2018   Vaginal yeast infection 01/30/2018   Chest tightness 01/27/2018   Healthcare-associated pneumonia 01/25/2018   Pleural effusion, not elsewhere classified 01/25/2018   Acute deep vein thrombosis (DVT) of left lower extremity ([place]) 01/24/2018   Acute deep vein thrombosis (DVT) of right lower extremity ([place]) 01/24/2018   Chronic allergic rhinitis 01/24/2018   Depression with anxiety 01/24/2018   UTI due to Klebsiella species 01/24/2018   Protein-calorie malnutrition, severe ([place]) 01/24/2018   S/P insertion of IVC (inferior vena caval) filter 01/24/2018   Reactive depression    Hypokalemia    Leukocytosis    Essential hypertension    Trauma    Tetraparesis ([facility])    Neuropathic pain    Neurogenic bowel    Neurogenic bladder    Benign essential HTN    Acute blood loss anemia    Central cord syndrome at C6 level of cervical spinal cord ([place]) 11/29/2017   Central cord syndrome ([place]) 11/29/2017    Lewis Moccasin, PT 02/16/2021, 4:43 PM  [place] MAIN [facility] SERVICES [address] [place], [place], [address] Phone:  [phone]   Fax:  [phone]  Name: Sherry [name] MRN: [id number] Date of Birth: 11/18/1934

## 2021-02-18 ENCOUNTER — Encounter: Payer: Self-pay | Admitting: Occupational Therapy

## 2021-02-18 ENCOUNTER — Ambulatory Visit: Payer: Medicare HMO | Admitting: Occupational Therapy

## 2021-02-18 ENCOUNTER — Ambulatory Visit: Payer: Medicare HMO

## 2021-02-18 ENCOUNTER — Other Ambulatory Visit: Payer: Self-pay

## 2021-02-18 DIAGNOSIS — Z91014 Allergy to mammalian meats: Secondary | ICD-10-CM

## 2021-02-18 DIAGNOSIS — R278 Other lack of coordination: Secondary | ICD-10-CM

## 2021-02-18 DIAGNOSIS — K592 Neurogenic bowel, not elsewhere classified: Secondary | ICD-10-CM

## 2021-02-18 DIAGNOSIS — N319 Neuromuscular dysfunction of bladder, unspecified: Secondary | ICD-10-CM

## 2021-02-18 DIAGNOSIS — R2681 Unsteadiness on feet: Secondary | ICD-10-CM

## 2021-02-18 DIAGNOSIS — M6281 Muscle weakness (generalized): Secondary | ICD-10-CM

## 2021-02-18 DIAGNOSIS — M1990 Unspecified osteoarthritis, unspecified site: Secondary | ICD-10-CM

## 2021-02-18 DIAGNOSIS — I82509 Chronic embolism and thrombosis of unspecified deep veins of unspecified lower extremity: Secondary | ICD-10-CM

## 2021-02-18 DIAGNOSIS — R262 Difficulty in walking, not elsewhere classified: Secondary | ICD-10-CM

## 2021-02-18 DIAGNOSIS — I10 Essential (primary) hypertension: Secondary | ICD-10-CM

## 2021-02-18 DIAGNOSIS — G8220 Paraplegia, unspecified: Secondary | ICD-10-CM

## 2021-02-18 DIAGNOSIS — R269 Unspecified abnormalities of gait and mobility: Secondary | ICD-10-CM

## 2021-02-18 DIAGNOSIS — K219 Gastro-esophageal reflux disease without esophagitis: Secondary | ICD-10-CM

## 2021-02-18 DIAGNOSIS — F39 Unspecified mood [affective] disorder: Secondary | ICD-10-CM

## 2021-02-18 NOTE — Therapy (Signed)
[place] MAIN [facility] SERVICES [address] [place], [place], [address] Phone: [phone]   Fax:  [phone]  Physical Therapy Treatment  Patient Details  Name: Sherry [name] MRN: [id number] Date of Birth: 02/01/1935 No data recorded  Encounter Date: 02/18/2021   PT End of Session - 02/18/21 1111     Visit Number 124    Number of Visits 136    Date for PT Re-Evaluation 03/16/21    Authorization Type a[employer] medicare FOTO performed by PT on eval (7/20), score 12, Progress note on 03/10/2020; PN on 08/20/2020    Authorization Time Period Recert 04/03/3555- 04/22/252; PN on 03/10/621; Recert 7/62/8315- 17/61/6073; PN on 08/11/6267; Recert 48/54/6270- 3/50/0938    PT Start Time 1145    PT Stop Time 1220    PT Time Calculation (min) 35 min    Equipment Utilized During Treatment Gait belt    Activity Tolerance Patient tolerated treatment well;Patient limited by fatigue;Patient limited by pain    Behavior During Therapy [facility] for tasks assessed/performed             Past Medical History:  Diagnosis Date   Acute blood loss anemia    Arthritis    Cancer ([place])    skin   Central cord syndrome at C6 level of cervical spinal cord ([place]) 11/29/2017   Hypertension    Protein-calorie malnutrition, severe ([place]) 01/24/2018   S/P insertion of IVC (inferior vena caval) filter 01/24/2018   Tetraparesis ([place])     Past Surgical History:  Procedure Laterality Date   ANTERIOR CERVICAL DECOMP/DISCECTOMY FUSION N/A 11/29/2017   Procedure: Cervical five-six, six-seven Anterior Cervical Decompression Fusion;  Surgeon: Judith Part, MD;  Location: [place];  Service: Neurosurgery;  Laterality: N/A;  Cervical five-six, six-seven Anterior Cervical Decompression Fusion   CATARACT EXTRACTION     IR IVC FILTER PLMT / S&I /IMG GUID/MOD SED  12/08/2017    There were no vitals filed for this visit.   Subjective Assessment - 02/18/21 1111     Subjective Patient  arrived with x-ray paper copy identifying a left avulsion fracture at the Greater trochanter.    Pertinent History Pt is an 87 yo female that fell in 2019, fractured vertebrae in her neck and in her low back per family. 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 After hospital stay, pt discharged to [facility] for ~1 month, experienced severe UTI as well as bilateral DVT (IVC filter placed). Discharged to [facility], stayed for about a year, and then moved to [facility] in April 2021. Was receiving PT, but per family facility reported that did not have adequate equipment to maximize PT for her. Pt until about 1 month ago was practicing sit to stand transfers with 1-2 people, and working on static standing. Has a brace for L foot due to PF. Pt currently needs assistance with all ADLs (able to assist with donning/doffing her shirt), bed baths, and needs a hoyer lift for transfers. Able to drive power wheelchair without assistance.    Currently in Pain? Yes    Pain Score 2     Pain Location Hip    Pain Orientation Left    Pain Descriptors / Indicators Sore    Pain Type Acute pain    Pain Onset 1 to 4 weeks ago    Pain Frequency Intermittent    Aggravating Factors  active Left LE movement    Pain Relieving Factors Rest    Effect of Pain  on Daily Activities Pain with bed mobility                INTERVENTIONS:        Therapeutic exercises:   Reclined exercises:  -B Heel slide 2 sets of 12 reps with AAROM -SLR BLE  x 10 reps x 2 sets -Hip abduction/add with min physical assist B LE 2 sets of 12 reps*Modified to stay within painfree motion --Quad sets with 5 sec hold BLE x 10 reps    -Seated exercises:   - Bilateral Hip march with AAROM 2 sets x 15 reps - Left knee ext - 2 sets x 12 reps AROM  - Right Knee ext- with AAROM then slow eccentric control 2 sets of 12 reps.  - Manually Resistive lumbar flex into abd curl- 2 sets of 10 reps.  -Hip add ball squeeze hold 3 sec x 10  reps   -Gluteal Sets hold 5 sec x 10 reps  Education provided throughout session via VC/TC and demonstration to facilitate movement at target joints and correct muscle activation for all testing and exercises performed.                         PT Education - 02/18/21 1250     Education Details Exercise technique    Person(s) Educated Patient    Methods Explanation;Demonstration    Comprehension Verbalized understanding;Returned demonstration;Verbal cues required;Tactile cues required;Need further instruction              PT Short Term Goals - 03/10/20 2211       PT SHORT TERM GOAL #1   Title The patient will perform initial HEP with minimum assistance in order to improve strength and function.    Baseline Patient demonstrating independence with initial HEP as of 03/10/2020 with no questions or difficulty.    Time 4    Period Weeks    Status Achieved    Target Date 02/11/20               PT Long Term Goals - 12/22/20 1155       PT LONG TERM GOAL #1   Title The patient will be compliant with finalized HEP with minimum assistance in preparation for self management and maintenance of condition.    Baseline 03/10/2020- Patient familiar with initial HEP and will keep goal active as HEP becomes more progressive including possible transfers and standing activities. 04/07/2020- Patient continues to report compliance with LE strengthening home exercises without questions or concerns. 04/21/2020- Patient able to verbalize and demonstrate good understanding of current HEP including some supine and seated LE strengthening exercises. Reviewed and patient performed 10 reps today with minimal cueing. Will keep goal active to progress HEP as appropriate. 05/28/2020- patient reports continues to perform LE strengthening as able and some help from caregiver as able. No changes at this time to home program as patient is limited to supine/seated therex. 07/02/2020- Patient  continues to report compliance with current supine and seated LE strengtheing home program and states no questions or concerns regarding her plan.    Time 12    Period Weeks    Status Achieved      PT LONG TERM GOAL #2   Title The patient will demonstrate at least 10 point improvement on FOTO score indicating an improved ability to perform functional activities.    Baseline on eval (/720) score 12; 10/22/19: 12; 12/10/19: 18, 12/13: 18: 04/07/2020- Will assess next visit.  07/02/2020- Will obtain next visit. 09/24/2020= Will obtain next visit. 11/12/2020= 12; 12/22/2020= 12    Time 12    Period Weeks    Status On-going    Target Date 03/16/21      PT LONG TERM GOAL #3   Title The patient will demonstrate lateral scooting  for assistance with transfers with minA to maximize independence.    Baseline 10/22/19: Pt requires modA+1 for lateral scooting; 12/10/19: modA+1, 12/13: modA-maxA, 12/27: modA-maxA, 03/10/2020- ModA-MaxA- increased verbal cues and visual demonstration. 04/07/2020- Continued Max assist to perform forward/lateral scoot. 04/21/2020- Patient was able to demo slight lateral scoot with CGA approx 12 in then fatigued requiring max assist- she is able to scoot forward by leaning without significant issues. 05/28/2020- While sitting in chair- patient able to perform forward and lateral scoot with VC/visual demo and increased time allowed- CGA today but not consistent yet- will keep goal active at this time to continue to focus on strengthening and improving technique. 07/02/2020- Patient able to demonstrate minimal ability to laterally shift weigh on mat table requiring max assist yet is able to forward scoot out to edge of mat table with min Assist. 08/20/2020 - Patient was able to demonstrate minimal lateral scooting at edge of mat while holding feet still so they did not slip on tile surface. She continues to be able to demonstrate scoot forward and uses tilt option to recline to position safely back into  seat of power wheelchair. 09/25/2020- Patient able to sit on mat and minimally scoot laterally but requires min/mod A for efficient scooting. Will end goal at this time due to plateau in progress with scooting over extended time.    Time 12    Period Weeks    Status Not Met      PT LONG TERM GOAL #4   Title The patient will demonstrate a squat pivot transfer with minimum assistance to maximize independence and mobility.    Baseline 10/22/19: unable to safely attempt at this time; 12/10/19: unable to safely attempt at this time, 01/14/20: unable to safety attempt at this time. 03/10/2020- Patient able to perform Stand pivot transfer with Maximal assist. 04/07/2020- Patient continues to require Max assist with sit to stand- will assist SPT next visit. 04/21/2020- Patient presents with Max assist to perform Sit to stand Transfer- unable to pivot or move Left LE well without difficulty. 07/02/2020= Max assist with SPT and minimal ability to pivot. 7/202/2022-Patient able to stand with minimal assist this visit (as good as CGA last visit) and able to move right foot to pivot and performing with moderate assist today. 09/24/2020- Patient able to perform sit to stand with Min assist from lift chair. Patient has demo inconsistency and will keep goal active.  12/17/2020- Goal is currently not appropriate as patient fractured her ankle and currently NWB. 12/22/2020=Goal is currently not appropriate as patient fractured her ankle and currently NWB.    Time 12    Period Weeks    Status On-going    Target Date 03/16/21      PT LONG TERM GOAL #5   Title The patient will demonstrate sitting without UE support for 2-5 minutes at EOB to improve participation and maximize independence with ADLs.    Baseline 10/22/19: Pt able to sit unsupported for at least 2 minutes at edge of bed. 04/21/2020- Patient continues to demo good sitting at edge of mat > 5 miin without difficulty or back support.    Time 12    Period  Weeks    Status  Achieved      Additional Long Term Goals   Additional Long Term Goals Yes      PT LONG TERM GOAL #6   Title Patient will demonstrate improved functional LE strength as seen by consistent ability to stand > 2 min (3 out of 4 trials) with Mod/Max A for improved LE strength with transfers.    Baseline 05/28/2020- Patient able to inconsistent stand 1-2 min right now in //bars with Mod/Max A. 07/02/2020- Patient was able to progress last 2 visit to standing using bilateral platform attachment between 1-2 min which is a progression from the parallel bars. 08/20/2020- Patient performed static standing with min- mod A using bilateral platform attachment on walker for 6 min 30 sec today with assist for trunk control and intermittent verbal cues for posture. 09/24/2020- Patient has consistently been able to stand >2 min in // bars and when using bilateral Platform walker. 12/23/2020= Will reopen this goal as patient should transition back to weightbearing and again practice standing in new cert.    Time 12    Period Weeks    Status Deferred    Target Date 03/16/21      PT LONG TERM GOAL #7   Title Patient/caregiver will demonstrate assisted transfer using [place] Lift consistently Independently for improved ability to transfer at home from lift chair to bed.    Baseline 09/24/2020- Patient requires hoyer lift (Dependent) for safe transfers.  12/17/2020- Patient currently still dependent on hoyer as she is currently non-weight bearing due to recent ankle fracture.    Time 12    Period Weeks    Status On-going    Target Date 03/16/21      PT LONG TERM GOAL #8   Title Patient will demo improved right LE quad strength as seen by ability to extend knee to 20 deg from 0 or less while performing a Long arc quad (for improved terminal knee ext and more full ROM)  for improved ability to bearing weight with Right LE.    Baseline 12/22/2020=able to extend knee to approx 35 deg from zero during long arc quad    Time 12     Period Weeks    Status New    Target Date 03/16/20                   Plan - 02/18/21 1111     Clinical Impression Statement Treatment was limited secondary to patient has x- ray results and (+) for avulsion fracture left greater trochanter. She responded well overall to all therex - did experience some left hip pain with min hip abd. Pt will benefit from skilled physical therapy to increase mobility and strength to improve patient's quality of life    Personal Factors and Comorbidities Age;Time since onset of injury/illness/exacerbation;Comorbidity 3+;Fitness    Comorbidities HTN, quadriparesis, history of DVT, neurogenic bladder    Examination-Activity Limitations Bathing;Hygiene/Grooming;Squat;Bed Mobility;Lift;Bend;Stand;Engineer, manufacturing;Toileting;Self Feeding;Transfers;Continence;Sit;Dressing;Sleep;Carry    Examination-Participation Restrictions Church;Laundry;Cleaning;Medication Management;Community Activity;Meal Prep;Interpersonal Relationship    Stability/Clinical Decision Making Evolving/Moderate complexity    Rehab Potential Fair    PT Frequency 2x / week    PT Duration 12 weeks    PT Treatment/Interventions ADLs/Self Care Home Management;Electrical Stimulation;Therapeutic activities;Wheelchair mobility training;Vasopneumatic Device;Joint Manipulations;Vestibular;Passive range of motion;Patient/family education;Therapeutic exercise;DME Instruction;Biofeedback;Aquatic Therapy;Moist Heat;Gait training;Balance training;Orthotic Fit/Training;Dry needling;Energy conservation;Taping;Splinting;Neuromuscular re-education;Cryotherapy;Ultrasound;Functional mobility training    PT Next Visit Plan Continue with progressive standing/transfer training once MD approved    PT Home Exercise Plan  no changes    Consulted and Agree with Plan of Care Patient             Patient will benefit from skilled therapeutic intervention in order to improve the following deficits and  impairments:  Abnormal gait, Decreased balance, Decreased endurance, Decreased mobility, Difficulty walking, Hypomobility, Impaired sensation, Decreased range of motion, Improper body mechanics, Impaired perceived functional ability, Decreased activity tolerance, Decreased knowledge of use of DME, Decreased safety awareness, Decreased strength, Impaired flexibility, Impaired UE functional use, Postural dysfunction  Visit Diagnosis: Abnormality of gait and mobility  Difficulty in walking, not elsewhere classified  Muscle weakness (generalized)  Unsteadiness on feet     Problem List Patient Active Problem List   Diagnosis Date Noted   Acute appendicitis with appendiceal abscess 05/30/2018   Hypocalcemia 02/15/2018   Dysuria 02/15/2018   Bilateral lower extremity edema 02/11/2018   Vaginal yeast infection 01/30/2018   Chest tightness 01/27/2018   Healthcare-associated pneumonia 01/25/2018   Pleural effusion, not elsewhere classified 01/25/2018   Acute deep vein thrombosis (DVT) of left lower extremity ([place]) 01/24/2018   Acute deep vein thrombosis (DVT) of right lower extremity ([place]) 01/24/2018   Chronic allergic rhinitis 01/24/2018   Depression with anxiety 01/24/2018   UTI due to Klebsiella species 01/24/2018   Protein-calorie malnutrition, severe ([place]) 01/24/2018   S/P insertion of IVC (inferior vena caval) filter 01/24/2018   Reactive depression    Hypokalemia    Leukocytosis    Essential hypertension    Trauma    Tetraparesis ([facility])    Neuropathic pain    Neurogenic bowel    Neurogenic bladder    Benign essential HTN    Acute blood loss anemia    Central cord syndrome at C6 level of cervical spinal cord ([place]) 11/29/2017   Central cord syndrome ([place]) 11/29/2017    Lewis Moccasin, PT 02/18/2021, 12:54 PM  [place] MAIN [facility] SERVICES [address] [place], [place], [address] Phone: [phone]   Fax:  [phone]  Name:  AVELINA MCCLURKIN MRN: [id number] Date of Birth: 1934-05-14

## 2021-02-18 NOTE — Therapy (Signed)
[place] MAIN [facility] SERVICES [address] [place], [place], [address] Phone: [phone]   Fax:  [phone]  Occupational Therapy Treatment  Patient Details  Name: Sherry [name] MRN: [id number] Date of Birth: August 16, 1934 No data recorded  Encounter Date: 02/18/2021   OT End of Session - 02/18/21 1215     Visit Number 101    Number of Visits 130    Date for OT Re-Evaluation 04/06/21    Authorization Time Period Progress report period starting 11/26/2020    OT Start Time 1102    OT Stop Time 1145    OT Time Calculation (min) 43 min    Equipment Utilized During Treatment moist heat    Activity Tolerance Patient tolerated treatment well    Behavior During Therapy [facility] for tasks assessed/performed             Past Medical History:  Diagnosis Date   Acute blood loss anemia    Arthritis    Cancer ([place])    skin   Central cord syndrome at C6 level of cervical spinal cord ([place]) 11/29/2017   Hypertension    Protein-calorie malnutrition, severe ([place]) 01/24/2018   S/P insertion of IVC (inferior vena caval) filter 01/24/2018   Tetraparesis ([place])     Past Surgical History:  Procedure Laterality Date   ANTERIOR CERVICAL DECOMP/DISCECTOMY FUSION N/A 11/29/2017   Procedure: Cervical five-six, six-seven Anterior Cervical Decompression Fusion;  Surgeon: Judith Part, MD;  Location: [facility];  Service: Neurosurgery;  Laterality: N/A;  Cervical five-six, six-seven Anterior Cervical Decompression Fusion   CATARACT EXTRACTION     IR IVC FILTER PLMT / S&I /IMG GUID/MOD SED  12/08/2017    There were no vitals filed for this visit.   Subjective Assessment - 02/18/21 1215     Subjective  Pt. reports no changes since the last visit.    Patient is accompanied by: Family member    Pertinent History Patient s/p fall November 29, 2017 resulting in diagnosis of central cord syndrome at C 6 level.  She has had therapy in multiple venues but with  recent move to [facility], therapy staff recommended she seek outpatient therapy for her needs.    Currently in Pain? No/denies            OT TREATMENT     Pt. tolerated bilateral shoulder flexion, extension, abduction, elbow flexion, extension forearm supination AAROM/AROM. PROM bilateral wrist extension, digit MP, PIP, and DIP flexion, and extension, thumb radial, and palmar abduction in conjunction with moist heat. Emphasis was placed on left wrist extension, bilateral thumb radial, and palmar abduction, and bilateral digit MP, PIP, and DIP flexion. Pt. worked on [employer], and reciprocal motion using the UBE while sitting for 8 min. with minimal resistance.   Pt. continues to make excellent progress, and is improving with reaching further with her BUEs in preparation for improving overhead ADLs, grooming/haircare, and writing. Pt. was able to tolerate the UBE for 8 min. with the UBE elevated for increased ROM. Pt. tolerated ROM well without reports of pain, or discomfort. Pt. continues to present with bilateral wrist flexor tightness, and digit extensor tightness. Pt. continues to benefit from working on impoving ROM in order to work towards increasing engagement of her bilateral hands during ADLs, and IADL tasks.                           OT Education - 02/18/21 1215  Education Details BUE functioning, and strengtheing    Person(s) Educated Patient;Caregiver(s)    Methods Explanation;Demonstration;Verbal cues    Comprehension Verbalized understanding;Need further instruction;Verbal cues required                 OT Long Term Goals - 02/16/21 1111       OT LONG TERM GOAL #1   Title Patient and caregiver will demonstrate understanding of home exercise program for ROM.    Baseline Pt.'s current restorative aide is retiring at the end of December. Pt. continues to have a restorative aide rehab aide assist her wih exercises at [facility]. 8/8:  Independently completes/directs HEP mulitple times/week. 12/17/2020: Pt. has a new restorative aide who will be resuming ROM program.    Time 12    Period Weeks    Status On-going    Target Date 04/06/21      OT LONG TERM GOAL #2   Title Patient will demonstrate ability to don shirt with min assist from seated position.      OT LONG TERM GOAL #4   Title Patient will demonstrate improved composite finger flexion to hold deodorant to apply to underarms.    Baseline 01/12/2021:  Pt. is progressing, and is able to reach to grasp, and hold narrow cones. 10/20/2020: Pt. conitnues to make steady progress with bilateral digit flexion.Pt. continues to present with stiifness/tightness which is hindering pt.'s ability to formulate a full composite fist. Pt. continues to present with improving finger flexion of R hand for a partial gross grasp, but unable to grasp and hold items. pt. conitnues to present with limited digit flexion of L hand. Pt. has active left thumb abduction. 8/8: holds cup with 2 hands, continues to have limited composite finger flexion L worse than R. 02/16/2021: pt. continues to present with limited bilateral gross composite fisting.    Time 12    Period Weeks    Status On-going    Target Date 04/06/21      OT LONG TERM GOAL #6   Title Patient will increase right UE ROM to comb the back of her hair with modified independence.    Baseline 01/12/2022: Pt. is able to comb her hair, reaching to the back of her hair.. 10/20/2020: Pt. continues to progress wtith RRUE ROM, and is able to use a pic for the back of her head, however is unable to brush the back of her hair. 07/30/2020: Pt. is now able to comb the back of her hair with a pic, however continues to work on completing the task efficiently. 8/8: Achieves with great effort, has assistance for efficiancy. 02/16/2021: Pt. is now able to reach, and brush the back of her hair.    Time 12    Status Partially Met    Target Date 04/06/21       OT LONG TERM GOAL #7   Title Pt. will independently, and efficiently send text messages on her phone.    Baseline 01/12/2022:: pt. is now able to type text messages on her phone. 10/20/2020: Pt. is now able to type a text message on the phone, however not efficiently taking increased time to complete. 07/30/2020: Pt. continues to present with limited BUE strength, and [facility] skills. 8/8: independtly drafts text messages, continues to work on accuracy and efficiancy 2/2 limited FMC.02/16/2021: pt. is now able to accessher phones,efficiently, and send text messages.    Time 12    Period Weeks    Status Achieved  OT LONG TERM GOAL #8   Title Pt. will write, and sign her name with 100% legibility, and modified independence.    Baseline 01/12/2021: Pt. continnues to fill out her daily menu and complete puzzles. 10/20/2020: Pt. continues to make progress overall. Pt. continues to consistently fill out her daily menu, and writing for crossword puzzles. Pt. is able to maintain grasp on a wide width pen. Contineus to work on Media planner. 8/8: writes daily, continues to work on improving speed. 02/16/2021: Pt. continues to present wiht limited writing legibility, fluctuating daily.    Time 12    Period Weeks    Status Partially Met    Target Date 04/06/21                   Plan - 02/18/21 1216     Clinical Impression Statement Pt. continues to make excellent progress, and is improving with reaching further with her BUEs in preparation for improving overhead ADLs, grooming/haircare, and writing. Pt. was able to tolerate the UBE for 8 min. with the UBE elevated for increased ROM. Pt. tolerated ROM well without reports of pain, or discomfort. Pt. continues to present with bilateral wrist flexor tightness, and digit extensor tightness. Pt. continues to benefit from working on impoving ROM in order to work towards increasing engagement of her bilateral hands during ADLs, and IADL tasks.        OT  Occupational Profile and History Detailed Assessment- Review of Records and additional review of physical, cognitive, psychosocial history related to current functional performance    Occupational performance deficits (Please refer to evaluation for details): ADL's;IADL's;Leisure;Social Participation    Body Structure / Function / Physical Skills ADL;Continence;Dexterity;Flexibility;Strength;ROM;Balance;Coordination;FMC;IADL;Endurance;UE functional use;Decreased knowledge of use of DME;GMC    Psychosocial Skills Coping Strategies    Rehab Potential Fair    Clinical Decision Making Several treatment options, min-mod task modification necessary    Comorbidities impacting occupational performance description: contractures of bilateral hands, dependent transfers,    Modification or Assistance to Complete Evaluation  No modification of tasks or assist necessary to complete eval    OT Frequency 2x / week    OT Duration 12 weeks    OT Treatment/Interventions Self-care/ADL training;Cryotherapy;Therapeutic exercise;DME and/or AE instruction;Balance training;Neuromuscular education;Manual Therapy;Splinting;Moist Heat;Passive range of motion;Therapeutic activities;Patient/family education    Plan continue to progress ROM of digits, wrists and shoulders as it pertains to completion of ADL tasks that pt values.    Consulted and Agree with Plan of Care Patient             Patient will benefit from skilled therapeutic intervention in order to improve the following deficits and impairments:   Body Structure / Function / Physical Skills: ADL, Continence, Dexterity, Flexibility, Strength, ROM, Balance, Coordination, FMC, IADL, Endurance, UE functional use, Decreased knowledge of use of DME, GMC   Psychosocial Skills: Coping Strategies   Visit Diagnosis: Muscle weakness (generalized)  Other lack of coordination    Problem List Patient Active Problem List   Diagnosis Date Noted   Acute appendicitis  with appendiceal abscess 05/30/2018   Hypocalcemia 02/15/2018   Dysuria 02/15/2018   Bilateral lower extremity edema 02/11/2018   Vaginal yeast infection 01/30/2018   Chest tightness 01/27/2018   Healthcare-associated pneumonia 01/25/2018   Pleural effusion, not elsewhere classified 01/25/2018   Acute deep vein thrombosis (DVT) of left lower extremity ([place]) 01/24/2018   Acute deep vein thrombosis (DVT) of right lower extremity ([place]) 01/24/2018   Chronic allergic rhinitis 01/24/2018  Depression with anxiety 01/24/2018   UTI due to Klebsiella species 01/24/2018   Protein-calorie malnutrition, severe ([place]) 01/24/2018   S/P insertion of IVC (inferior vena caval) filter 01/24/2018   Reactive depression    Hypokalemia    Leukocytosis    Essential hypertension    Trauma    Tetraparesis (HCC)    Neuropathic pain    Neurogenic bowel    Neurogenic bladder    Benign essential HTN    Acute blood loss anemia    Central cord syndrome at C6 level of cervical spinal cord ([place]) 11/29/2017   Central cord syndrome [facility]) 11/29/2017    Harrel Carina, OTR/L 02/18/2021, 12:19 PM  [place] MAIN [facility] SERVICES [address] [place], [place], [address] Phone: [phone]   Fax:  [phone]  Name: HADAR ELGERSMA MRN: [id number] Date of Birth: 21-Jan-1935

## 2021-02-23 ENCOUNTER — Ambulatory Visit: Payer: Medicare HMO | Admitting: Occupational Therapy

## 2021-02-23 ENCOUNTER — Other Ambulatory Visit: Payer: Self-pay

## 2021-02-23 ENCOUNTER — Encounter: Payer: Self-pay | Admitting: Occupational Therapy

## 2021-02-23 ENCOUNTER — Ambulatory Visit: Payer: Medicare HMO

## 2021-02-23 DIAGNOSIS — R262 Difficulty in walking, not elsewhere classified: Secondary | ICD-10-CM

## 2021-02-23 DIAGNOSIS — R278 Other lack of coordination: Secondary | ICD-10-CM

## 2021-02-23 DIAGNOSIS — M6281 Muscle weakness (generalized): Secondary | ICD-10-CM

## 2021-02-23 DIAGNOSIS — R269 Unspecified abnormalities of gait and mobility: Secondary | ICD-10-CM

## 2021-02-23 DIAGNOSIS — R2681 Unsteadiness on feet: Secondary | ICD-10-CM

## 2021-02-23 NOTE — Therapy (Signed)
[place] MAIN [facility] SERVICES [address] [place], [place], [address] Phone: [phone]   Fax:  [phone]  Occupational Therapy Treatment  Patient Details  Name: Sherry [name] MRN: [id number] Date of Birth: 06/05/1934 No data recorded  Encounter Date: 02/23/2021   OT End of Session - 02/23/21 1208     Visit Number 102    Number of Visits 130    Date for OT Re-Evaluation 04/06/21    Authorization Time Period Progress report period starting 11/26/2020    OT Start Time 1100    OT Stop Time 1145    OT Time Calculation (min) 45 min    Activity Tolerance Patient tolerated treatment well    Behavior During Therapy [facility] for tasks assessed/performed             Past Medical History:  Diagnosis Date   Acute blood loss anemia    Arthritis    Cancer ([place])    skin   Central cord syndrome at C6 level of cervical spinal cord ([place]) 11/29/2017   Hypertension    Protein-calorie malnutrition, severe ([place]) 01/24/2018   S/P insertion of IVC (inferior vena caval) filter 01/24/2018   Tetraparesis ([place])     Past Surgical History:  Procedure Laterality Date   ANTERIOR CERVICAL DECOMP/DISCECTOMY FUSION N/A 11/29/2017   Procedure: Cervical five-six, six-seven Anterior Cervical Decompression Fusion;  Surgeon: Judith Part, MD;  Location: [place];  Service: Neurosurgery;  Laterality: N/A;  Cervical five-six, six-seven Anterior Cervical Decompression Fusion   CATARACT EXTRACTION     IR IVC FILTER PLMT / S&I /IMG GUID/MOD SED  12/08/2017    There were no vitals filed for this visit.   Subjective Assessment - 02/23/21 1207     Subjective  Pt. reports no changes since the last visit.    Patient is accompanied by: Family member    Pertinent History Patient s/p fall November 29, 2017 resulting in diagnosis of central cord syndrome at C 6 level.  She has had therapy in multiple venues but with recent move to [facility], therapy staff recommended  she seek outpatient therapy for her needs.    Patient Stated Goals Patient would like to be able to move better in bed, stand and perform self care tasks.    Currently in Pain? No/denies            OT TREATMENT    Therapeutic Ex:   Pt. tolerated bilateral shoulder flexion, extension, abduction, elbow flexion, extension forearm supination AAROM/AROM. PROM bilateral wrist extension, digit MP, PIP, and DIP flexion, and extension, thumb radial, and palmar abduction in conjunction with moist heat. Emphasis was placed on left wrist extension, bilateral thumb radial, and palmar abduction, and bilateral digit MP, PIP, and DIP flexion.  Self-Care:  Pt. worked on [employer] tasks, and formulating word responses to fit the letters in small designated squares on crossword puzzles. Pt. required the pen to be repositioned in her right hand, holding the pen with her digits extended. Pt required forearm support for increased distal stability/control, with lateral hand/ulnar support.     Pt. was able to hold, and stabilize the pen in her right hand with support proximally for increased stability when writing. Pt. was able to complete a crossword with printed letters at 75% legibility in larger print crossword boxes with positive deviation through the line. Pt. tolerated ROM well without reports of pain, or discomfort. Pt. continues to present with bilateral wrist flexor tightness, and digit extensor  tightness. Pt. continues to benefit from working on impoving ROM, and writing legibility in order to work towards increasing engagement of her bilateral hands during ADLs, and IADL tasks.                             OT Education - 02/23/21 1208     Education Details BUE functioning, and strengtheing    Person(s) Educated Patient;Caregiver(s)    Methods Explanation;Demonstration;Verbal cues    Comprehension Verbalized understanding;Need further instruction;Verbal cues required                  OT Long Term Goals - 02/16/21 1111       OT LONG TERM GOAL #1   Title Patient and caregiver will demonstrate understanding of home exercise program for ROM.    Baseline Pt.'s current restorative aide is retiring at the end of December. Pt. continues to have a restorative aide rehab aide assist her wih exercises at [facility]. 8/8: Independently completes/directs HEP mulitple times/week. 12/17/2020: Pt. has a new restorative aide who will be resuming ROM program.    Time 12    Period Weeks    Status On-going    Target Date 04/06/21      OT LONG TERM GOAL #2   Title Patient will demonstrate ability to don shirt with min assist from seated position.      OT LONG TERM GOAL #4   Title Patient will demonstrate improved composite finger flexion to hold deodorant to apply to underarms.    Baseline 01/12/2021:  Pt. is progressing, and is able to reach to grasp, and hold narrow cones. 10/20/2020: Pt. conitnues to make steady progress with bilateral digit flexion.Pt. continues to present with stiifness/tightness which is hindering pt.'s ability to formulate a full composite fist. Pt. continues to present with improving finger flexion of R hand for a partial gross grasp, but unable to grasp and hold items. pt. conitnues to present with limited digit flexion of L hand. Pt. has active left thumb abduction. 8/8: holds cup with 2 hands, continues to have limited composite finger flexion L worse than R. 02/16/2021: pt. continues to present with limited bilateral gross composite fisting.    Time 12    Period Weeks    Status On-going    Target Date 04/06/21      OT LONG TERM GOAL #6   Title Patient will increase right UE ROM to comb the back of her hair with modified independence.    Baseline 01/12/2022: Pt. is able to comb her hair, reaching to the back of her hair.. 10/20/2020: Pt. continues to progress wtith RRUE ROM, and is able to use a pic for the back of her head, however is unable to brush the  back of her hair. 07/30/2020: Pt. is now able to comb the back of her hair with a pic, however continues to work on completing the task efficiently. 8/8: Achieves with great effort, has assistance for efficiancy. 02/16/2021: Pt. is now able to reach, and brush the back of her hair.    Time 12    Status Partially Met    Target Date 04/06/21      OT LONG TERM GOAL #7   Title Pt. will independently, and efficiently send text messages on her phone.    Baseline 01/12/2022:: pt. is now able to type text messages on her phone. 10/20/2020: Pt. is now able to type a text message on the  phone, however not efficiently taking increased time to complete. 07/30/2020: Pt. continues to present with limited BUE strength, and [facility] skills. 8/8: independtly drafts text messages, continues to work on accuracy and efficiancy 2/2 limited FMC.02/16/2021: pt. is now able to accessher phones,efficiently, and send text messages.    Time 12    Period Weeks    Status Achieved      OT LONG TERM GOAL #8   Title Pt. will write, and sign her name with 100% legibility, and modified independence.    Baseline 01/12/2021: Pt. continnues to fill out her daily menu and complete puzzles. 10/20/2020: Pt. continues to make progress overall. Pt. continues to consistently fill out her daily menu, and writing for crossword puzzles. Pt. is able to maintain grasp on a wide width pen. Contineus to work on [employer]. 8/8: writes daily, continues to work on improving speed. 02/16/2021: Pt. continues to present wiht limited writing legibility, fluctuating daily.    Time 12    Period Weeks    Status Partially Met    Target Date 04/06/21                   Plan - 02/23/21 1208     Clinical Impression Statement Pt. was able to hold, and stabilize the pen in her right hand with support proximally for increased stability when writing. Pt. was able to complete a crossword with printed letters at 75% legibility in larger print crossword  boxes with positive deviation through the line. Pt. tolerated ROM well without reports of pain, or discomfort. Pt. continues to present with bilateral wrist flexor tightness, and digit extensor tightness. Pt. continues to benefit from working on impoving ROM, and writing legibility in order to work towards increasing engagement of her bilateral hands during ADLs, and IADL tasks.    OT Occupational Profile and History Detailed Assessment- Review of Records and additional review of physical, cognitive, psychosocial history related to current functional performance    Occupational performance deficits (Please refer to evaluation for details): ADL's;IADL's;Leisure;Social Participation    Body Structure / Function / Physical Skills ADL;Continence;Dexterity;Flexibility;Strength;ROM;Balance;Coordination;FMC;IADL;Endurance;UE functional use;Decreased knowledge of use of DME;GMC    Psychosocial Skills Coping Strategies    Rehab Potential Fair    Clinical Decision Making Several treatment options, min-mod task modification necessary    Comorbidities Affecting Occupational Performance: Presence of comorbidities impacting occupational performance    Modification or Assistance to Complete Evaluation  No modification of tasks or assist necessary to complete eval    OT Frequency 2x / week    OT Duration 12 weeks    OT Treatment/Interventions Self-care/ADL training;Cryotherapy;Therapeutic exercise;DME and/or AE instruction;Balance training;Neuromuscular education;Manual Therapy;Splinting;Moist Heat;Passive range of motion;Therapeutic activities;Patient/family education    Plan continue to progress ROM of digits, wrists and shoulders as it pertains to completion of ADL tasks that pt values.    Consulted and Agree with Plan of Care Patient             Patient will benefit from skilled therapeutic intervention in order to improve the following deficits and impairments:   Body Structure / Function / Physical  Skills: ADL, Continence, Dexterity, Flexibility, Strength, ROM, Balance, Coordination, FMC, IADL, Endurance, UE functional use, Decreased knowledge of use of DME, GMC   Psychosocial Skills: Coping Strategies   Visit Diagnosis: Muscle weakness (generalized)  Other lack of coordination    Problem List Patient Active Problem List   Diagnosis Date Noted   Acute appendicitis with appendiceal abscess 05/30/2018   Hypocalcemia 02/15/2018  Dysuria 02/15/2018   Bilateral lower extremity edema 02/11/2018   Vaginal yeast infection 01/30/2018   Chest tightness 01/27/2018   Healthcare-associated pneumonia 01/25/2018   Pleural effusion, not elsewhere classified 01/25/2018   Acute deep vein thrombosis (DVT) of left lower extremity ([place]) 01/24/2018   Acute deep vein thrombosis (DVT) of right lower extremity ([place]) 01/24/2018   Chronic allergic rhinitis 01/24/2018   Depression with anxiety 01/24/2018   UTI due to Klebsiella species 01/24/2018   Protein-calorie malnutrition, severe ([place]) 01/24/2018   S/P insertion of IVC (inferior vena caval) filter 01/24/2018   Reactive depression    Hypokalemia    Leukocytosis    Essential hypertension    Trauma    Tetraparesis (HCC)    Neuropathic pain    Neurogenic bowel    Neurogenic bladder    Benign essential HTN    Acute blood loss anemia    Central cord syndrome at C6 level of cervical spinal cord ([place]) 11/29/2017   Central cord syndrome ([place]) 11/29/2017    Harrel Carina, MS, OTR/L 02/23/2021, 12:11 PM  [place] MAIN [facility] SERVICES [address] [place], [place], [address] Phone: [phone]   Fax:  [phone]  Name: Sherry [name] MRN: [id number] Date of Birth: 1934/02/24

## 2021-02-23 NOTE — Therapy (Signed)
[place] MAIN [facility] SERVICES [address] [place], [place], [address] Phone: [phone]   Fax:  [phone]  Physical Therapy Treatment  Patient Details  Name: NOBLE BODIE MRN: [id number] Date of Birth: 15-Apr-1934 No data recorded  Encounter Date: 02/23/2021   PT End of Session - 02/23/21 1446     Visit Number 125    Number of Visits 136    Date for PT Re-Evaluation 03/16/21    Authorization Type [employer] medicare FOTO performed by PT on eval (7/20), score 12, Progress note on 03/10/2020; PN on 08/20/2020    Authorization Time Period Recert 10/09/3380- 06/06/3974; PN on 08/04/4191; Recert 7/90/2409- 73/53/2992; PN on 05/28/8339; Recert 96/22/2979- 8/92/1194    PT Start Time 1146    PT Stop Time 1225    PT Time Calculation (min) 39 min    Equipment Utilized During Treatment Gait belt    Activity Tolerance Patient tolerated treatment well;Patient limited by fatigue;Patient limited by pain    Behavior During Therapy [facility] for tasks assessed/performed             Past Medical History:  Diagnosis Date   Acute blood loss anemia    Arthritis    Cancer ([place])    skin   Central cord syndrome at C6 level of cervical spinal cord ([place]) 11/29/2017   Hypertension    Protein-calorie malnutrition, severe ([place]) 01/24/2018   S/P insertion of IVC (inferior vena caval) filter 01/24/2018   Tetraparesis ([place])     Past Surgical History:  Procedure Laterality Date   ANTERIOR CERVICAL DECOMP/DISCECTOMY FUSION N/A 11/29/2017   Procedure: Cervical five-six, six-seven Anterior Cervical Decompression Fusion;  Surgeon: Judith Part, MD;  Location: [facility];  Service: Neurosurgery;  Laterality: N/A;  Cervical five-six, six-seven Anterior Cervical Decompression Fusion   CATARACT EXTRACTION     IR IVC FILTER PLMT / S&I /IMG GUID/MOD SED  12/08/2017    There were no vitals filed for this visit.   Subjective Assessment - 02/23/21 1156     Subjective Patient  reports no new changes since last visit. States her left leg is feeling better.    Pertinent History Pt is an 87 yo female that fell in 2019, fractured vertebrae in her neck and in her low back per family. 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 After hospital stay, pt discharged to [facility] for ~1 month, experienced severe UTI as well as bilateral DVT (IVC filter placed). Discharged to [facility], stayed for about a year, and then moved to [facility] in April 2021. Was receiving PT, but per family facility reported that did not have adequate equipment to maximize PT for her. Pt until about 1 month ago was practicing sit to stand transfers with 1-2 people, and working on static standing. Has a brace for L foot due to PF. Pt currently needs assistance with all ADLs (able to assist with donning/doffing her shirt), bed baths, and needs a hoyer lift for transfers. Able to drive power wheelchair without assistance.    Currently in Pain? No/denies    Pain Onset 1 to 4 weeks ago               Therapeutic exercises:   Reclined exercises:  -B Heel slide 2 sets of 12 reps with AAROM -SLR BLE  x 10 reps x 2 sets -Hip abduction/add with min physical assist B LE 2 sets of 10 reps*Modified to stay within painfree motion --Quad sets with 5 sec  hold BLE x 10 reps    -Seated exercises:   - Bilateral Hip march with AAROM on right and using 2lb AW on left (minimal height) 2 sets x 15 reps - Left knee ext - 2 sets x 12 reps AROM on right and 2 lb. AW on left -- Manually Resistive lumbar flex into abd curl- 2 sets of 10 reps.  -Hip add ball squeeze hold 3 sec x 10 reps x 2 sets  -Gluteal Sets hold 5 sec x 10 reps x 2 sets  - Boxing moves- combination L Jab/R hook/ Luppercut x 20  R Jab/L Hook/R Uppercut x 20  Double jab, cross, uppercut x 20 each  VC for posture and keep back off back rest.    Education provided throughout session via VC/TC and demonstration to facilitate movement at  target joints and correct muscle activation for all testing and exercises performed.                            PT Education - 02/23/21 1446     Education Details Exercise technique    Person(s) Educated Patient    Methods Explanation;Demonstration;Tactile cues;Verbal cues    Comprehension Verbalized understanding;Returned demonstration;Verbal cues required;Tactile cues required;Need further instruction              PT Short Term Goals - 03/10/20 2211       PT SHORT TERM GOAL #1   Title The patient will perform initial HEP with minimum assistance in order to improve strength and function.    Baseline Patient demonstrating independence with initial HEP as of 03/10/2020 with no questions or difficulty.    Time 4    Period Weeks    Status Achieved    Target Date 02/11/20               PT Long Term Goals - 12/22/20 1155       PT LONG TERM GOAL #1   Title The patient will be compliant with finalized HEP with minimum assistance in preparation for self management and maintenance of condition.    Baseline 03/10/2020- Patient familiar with initial HEP and will keep goal active as HEP becomes more progressive including possible transfers and standing activities. 04/07/2020- Patient continues to report compliance with LE strengthening home exercises without questions or concerns. 04/21/2020- Patient able to verbalize and demonstrate good understanding of current HEP including some supine and seated LE strengthening exercises. Reviewed and patient performed 10 reps today with minimal cueing. Will keep goal active to progress HEP as appropriate. 05/28/2020- patient reports continues to perform LE strengthening as able and some help from caregiver as able. No changes at this time to home program as patient is limited to supine/seated therex. 07/02/2020- Patient continues to report compliance with current supine and seated LE strengtheing home program and states no questions  or concerns regarding her plan.    Time 12    Period Weeks    Status Achieved      PT LONG TERM GOAL #2   Title The patient will demonstrate at least 10 point improvement on FOTO score indicating an improved ability to perform functional activities.    Baseline on eval (/720) score 12; 10/22/19: 12; 12/10/19: 18, 12/13: 18: 04/07/2020- Will assess next visit. 07/02/2020- Will obtain next visit. 09/24/2020= Will obtain next visit. 11/12/2020= 12; 12/22/2020= 12    Time 12    Period Weeks    Status On-going  Target Date 03/16/21      PT LONG TERM GOAL #3   Title The patient will demonstrate lateral scooting  for assistance with transfers with minA to maximize independence.    Baseline 10/22/19: Pt requires modA+1 for lateral scooting; 12/10/19: modA+1, 12/13: modA-maxA, 12/27: modA-maxA, 03/10/2020- ModA-MaxA- increased verbal cues and visual demonstration. 04/07/2020- Continued Max assist to perform forward/lateral scoot. 04/21/2020- Patient was able to demo slight lateral scoot with CGA approx 12 in then fatigued requiring max assist- she is able to scoot forward by leaning without significant issues. 05/28/2020- While sitting in chair- patient able to perform forward and lateral scoot with VC/visual demo and increased time allowed- CGA today but not consistent yet- will keep goal active at this time to continue to focus on strengthening and improving technique. 07/02/2020- Patient able to demonstrate minimal ability to laterally shift weigh on mat table requiring max assist yet is able to forward scoot out to edge of mat table with min Assist. 08/20/2020 - Patient was able to demonstrate minimal lateral scooting at edge of mat while holding feet still so they did not slip on tile surface. She continues to be able to demonstrate scoot forward and uses tilt option to recline to position safely back into seat of power wheelchair. 09/25/2020- Patient able to sit on mat and minimally scoot laterally but requires min/mod  A for efficient scooting. Will end goal at this time due to plateau in progress with scooting over extended time.    Time 12    Period Weeks    Status Not Met      PT LONG TERM GOAL #4   Title The patient will demonstrate a squat pivot transfer with minimum assistance to maximize independence and mobility.    Baseline 10/22/19: unable to safely attempt at this time; 12/10/19: unable to safely attempt at this time, 01/14/20: unable to safety attempt at this time. 03/10/2020- Patient able to perform Stand pivot transfer with Maximal assist. 04/07/2020- Patient continues to require Max assist with sit to stand- will assist SPT next visit. 04/21/2020- Patient presents with Max assist to perform Sit to stand Transfer- unable to pivot or move Left LE well without difficulty. 07/02/2020= Max assist with SPT and minimal ability to pivot. 7/202/2022-Patient able to stand with minimal assist this visit (as good as CGA last visit) and able to move right foot to pivot and performing with moderate assist today. 09/24/2020- Patient able to perform sit to stand with Min assist from lift chair. Patient has demo inconsistency and will keep goal active.  12/17/2020- Goal is currently not appropriate as patient fractured her ankle and currently NWB. 12/22/2020=Goal is currently not appropriate as patient fractured her ankle and currently NWB.    Time 12    Period Weeks    Status On-going    Target Date 03/16/21      PT LONG TERM GOAL #5   Title The patient will demonstrate sitting without UE support for 2-5 minutes at EOB to improve participation and maximize independence with ADLs.    Baseline 10/22/19: Pt able to sit unsupported for at least 2 minutes at edge of bed. 04/21/2020- Patient continues to demo good sitting at edge of mat > 5 miin without difficulty or back support.    Time 12    Period Weeks    Status Achieved      Additional Long Term Goals   Additional Long Term Goals Yes      PT LONG TERM GOAL #6  Title  Patient will demonstrate improved functional LE strength as seen by consistent ability to stand > 2 min (3 out of 4 trials) with Mod/Max A for improved LE strength with transfers.    Baseline 05/28/2020- Patient able to inconsistent stand 1-2 min right now in //bars with Mod/Max A. 07/02/2020- Patient was able to progress last 2 visit to standing using bilateral platform attachment between 1-2 min which is a progression from the parallel bars. 08/20/2020- Patient performed static standing with min- mod A using bilateral platform attachment on walker for 6 min 30 sec today with assist for trunk control and intermittent verbal cues for posture. 09/24/2020- Patient has consistently been able to stand >2 min in // bars and when using bilateral Platform walker. 12/23/2020= Will reopen this goal as patient should transition back to weightbearing and again practice standing in new cert.    Time 12    Period Weeks    Status Deferred    Target Date 03/16/21      PT LONG TERM GOAL #7   Title Patient/caregiver will demonstrate assisted transfer using [place] Lift consistently Independently for improved ability to transfer at home from lift chair to bed.    Baseline 09/24/2020- Patient requires hoyer lift (Dependent) for safe transfers.  12/17/2020- Patient currently still dependent on hoyer as she is currently non-weight bearing due to recent ankle fracture.    Time 12    Period Weeks    Status On-going    Target Date 03/16/21      PT LONG TERM GOAL #8   Title Patient will demo improved right LE quad strength as seen by ability to extend knee to 20 deg from 0 or less while performing a Long arc quad (for improved terminal knee ext and more full ROM)  for improved ability to bearing weight with Right LE.    Baseline 12/22/2020=able to extend knee to approx 35 deg from zero during long arc quad    Time 12    Period Weeks    Status New    Target Date 03/16/20                   Plan - 02/23/21 1446      Clinical Impression Statement Patient performed well today and able to work within painfree motion and able to complete all reps and activities without difficulty. Patient was also able to engage core, arms and posture with boxing activity today. Pt will benefit from skilled physical therapy to increase mobility and strength to improve patient's quality of life    Personal Factors and Comorbidities Age;Time since onset of injury/illness/exacerbation;Comorbidity 3+;Fitness    Comorbidities HTN, quadriparesis, history of DVT, neurogenic bladder    Examination-Activity Limitations Bathing;Hygiene/Grooming;Squat;Bed Mobility;Lift;Bend;Stand;Engineer, manufacturing;Toileting;Self Feeding;Transfers;Continence;Sit;Dressing;Sleep;Carry    Examination-Participation Restrictions Church;Laundry;Cleaning;Medication Management;Community Activity;Meal Prep;Interpersonal Relationship    Stability/Clinical Decision Making Evolving/Moderate complexity    Rehab Potential Fair    PT Frequency 2x / week    PT Duration 12 weeks    PT Treatment/Interventions ADLs/Self Care Home Management;Electrical Stimulation;Therapeutic activities;Wheelchair mobility training;Vasopneumatic Device;Joint Manipulations;Vestibular;Passive range of motion;Patient/family education;Therapeutic exercise;DME Instruction;Biofeedback;Aquatic Therapy;Moist Heat;Gait training;Balance training;Orthotic Fit/Training;Dry needling;Energy conservation;Taping;Splinting;Neuromuscular re-education;Cryotherapy;Ultrasound;Functional mobility training    PT Next Visit Plan Continue with progressive standing/transfer training once MD approved    PT Home Exercise Plan no changes    Consulted and Agree with Plan of Care Patient             Patient will benefit from skilled therapeutic intervention in order to  improve the following deficits and impairments:  Abnormal gait, Decreased balance, Decreased endurance, Decreased mobility, Difficulty walking,  Hypomobility, Impaired sensation, Decreased range of motion, Improper body mechanics, Impaired perceived functional ability, Decreased activity tolerance, Decreased knowledge of use of DME, Decreased safety awareness, Decreased strength, Impaired flexibility, Impaired UE functional use, Postural dysfunction  Visit Diagnosis: Muscle weakness (generalized)  Abnormality of gait and mobility  Difficulty in walking, not elsewhere classified  Unsteadiness on feet     Problem List Patient Active Problem List   Diagnosis Date Noted   Acute appendicitis with appendiceal abscess 05/30/2018   Hypocalcemia 02/15/2018   Dysuria 02/15/2018   Bilateral lower extremity edema 02/11/2018   Vaginal yeast infection 01/30/2018   Chest tightness 01/27/2018   Healthcare-associated pneumonia 01/25/2018   Pleural effusion, not elsewhere classified 01/25/2018   Acute deep vein thrombosis (DVT) of left lower extremity ([place]) 01/24/2018   Acute deep vein thrombosis (DVT) of right lower extremity ([place]) 01/24/2018   Chronic allergic rhinitis 01/24/2018   Depression with anxiety 01/24/2018   UTI due to Klebsiella species 01/24/2018   Protein-calorie malnutrition, severe ([place]) 01/24/2018   S/P insertion of IVC (inferior vena caval) filter 01/24/2018   Reactive depression    Hypokalemia    Leukocytosis    Essential hypertension    Trauma    Tetraparesis ([facility])    Neuropathic pain    Neurogenic bowel    Neurogenic bladder    Benign essential HTN    Acute blood loss anemia    Central cord syndrome at C6 level of cervical spinal cord ([place]) 11/29/2017   Central cord syndrome ([place]) 11/29/2017    Lewis Moccasin, PT 02/23/2021, 2:57 PM  [place] MAIN [facility] SERVICES [address] [place], [place], [address] Phone: [phone]   Fax:  [phone]  Name: KEELAN POMERLEAU MRN: [id number] Date of Birth: 1934-07-10

## 2021-02-25 ENCOUNTER — Encounter: Payer: Self-pay | Admitting: Occupational Therapy

## 2021-02-25 ENCOUNTER — Ambulatory Visit: Payer: Medicare HMO

## 2021-02-25 ENCOUNTER — Ambulatory Visit: Payer: Medicare HMO | Admitting: Occupational Therapy

## 2021-02-25 ENCOUNTER — Other Ambulatory Visit: Payer: Self-pay

## 2021-02-25 DIAGNOSIS — M6281 Muscle weakness (generalized): Secondary | ICD-10-CM | POA: Diagnosis not present

## 2021-02-25 DIAGNOSIS — R262 Difficulty in walking, not elsewhere classified: Secondary | ICD-10-CM

## 2021-02-25 DIAGNOSIS — R2681 Unsteadiness on feet: Secondary | ICD-10-CM

## 2021-02-25 NOTE — Therapy (Signed)
[place] MAIN [facility] SERVICES [address] [place], [place], [address] Phone: [phone]   Fax:  [phone]  Occupational Therapy Treatment  Patient Details  Name: Sherry [name] MRN: [id number] Date of Birth: 1934-09-08 No data recorded  Encounter Date: 02/25/2021   OT End of Session - 02/25/21 1437     Visit Number 103    Number of Visits 130    Date for OT Re-Evaluation 04/06/21    OT Start Time 1103    OT Stop Time 1145    OT Time Calculation (min) 42 min    Equipment Utilized During Treatment moist heat    Activity Tolerance Patient tolerated treatment well    Behavior During Therapy [facility] for tasks assessed/performed             Past Medical History:  Diagnosis Date   Acute blood loss anemia    Arthritis    Cancer ([place])    skin   Central cord syndrome at C6 level of cervical spinal cord ([place]) 11/29/2017   Hypertension    Protein-calorie malnutrition, severe ([place]) 01/24/2018   S/P insertion of IVC (inferior vena caval) filter 01/24/2018   Tetraparesis ([place])     Past Surgical History:  Procedure Laterality Date   ANTERIOR CERVICAL DECOMP/DISCECTOMY FUSION N/A 11/29/2017   Procedure: Cervical five-six, six-seven Anterior Cervical Decompression Fusion;  Surgeon: Judith Part, MD;  Location: [place];  Service: Neurosurgery;  Laterality: N/A;  Cervical five-six, six-seven Anterior Cervical Decompression Fusion   CATARACT EXTRACTION     IR IVC FILTER PLMT / S&I /IMG GUID/MOD SED  12/08/2017    There were no vitals filed for this visit.   Subjective Assessment - 02/25/21 1437     Subjective  Pt. reports no changes since the last visit.    Patient is accompanied by: Family member    Pertinent History Patient s/p fall November 29, 2017 resulting in diagnosis of central cord syndrome at C 6 level.  She has had therapy in multiple venues but with recent move to [facility], therapy staff recommended she seek outpatient  therapy for her needs.    Currently in Pain? No/denies            OT TREATMENT     Pt. tolerated bilateral shoulder flexion, extension, abduction, elbow flexion, extension forearm supination AAROM/AROM. PROM bilateral wrist extension, digit MP, PIP, and DIP flexion, and extension, thumb radial, and palmar abduction in conjunction with moist heat. Emphasis was placed on left wrist extension, bilateral thumb radial, and palmar abduction, and bilateral digit MP, PIP, and DIP flexion. Pt. worked on [employer], and reciprocal motion using the UBE while sitting for 8 min. with minimal resistance.   Pt. continues to make excellent progress, and is improving with reaching further with her BUEs in preparation for improving overhead ADLs, grooming/haircare, and writing. Pt. was able to tolerate the UBE without difficulty. Pt. tolerated ROM well without reports of pain, or discomfort. Pt. continues to present with bilateral wrist flexor tightness, and digit extensor tightness. Pt. continues to benefit from working on impoving ROM in order to work towards increasing engagement of her bilateral hands during ADLs, and IADL tasks.                                  OT Education - 02/25/21 1437     Education Details BUE functioning, and strengtheing  Person(s) Educated Patient;Caregiver(s)    Methods Explanation;Demonstration;Verbal cues    Comprehension Verbalized understanding;Need further instruction;Verbal cues required                 OT Long Term Goals - 02/16/21 1111       OT LONG TERM GOAL #1   Title Patient and caregiver will demonstrate understanding of home exercise program for ROM.    Baseline Pt.'s current restorative aide is retiring at the end of December. Pt. continues to have a restorative aide rehab aide assist her wih exercises at [facility]. 8/8: Independently completes/directs HEP mulitple times/week. 12/17/2020: Pt. has a new restorative aide  who will be resuming ROM program.    Time 12    Period Weeks    Status On-going    Target Date 04/06/21      OT LONG TERM GOAL #2   Title Patient will demonstrate ability to don shirt with min assist from seated position.      OT LONG TERM GOAL #4   Title Patient will demonstrate improved composite finger flexion to hold deodorant to apply to underarms.    Baseline 01/12/2021:  Pt. is progressing, and is able to reach to grasp, and hold narrow cones. 10/20/2020: Pt. conitnues to make steady progress with bilateral digit flexion.Pt. continues to present with stiifness/tightness which is hindering pt.'s ability to formulate a full composite fist. Pt. continues to present with improving finger flexion of R hand for a partial gross grasp, but unable to grasp and hold items. pt. conitnues to present with limited digit flexion of L hand. Pt. has active left thumb abduction. 8/8: holds cup with 2 hands, continues to have limited composite finger flexion L worse than R. 02/16/2021: pt. continues to present with limited bilateral gross composite fisting.    Time 12    Period Weeks    Status On-going    Target Date 04/06/21      OT LONG TERM GOAL #6   Title Patient will increase right UE ROM to comb the back of her hair with modified independence.    Baseline 01/12/2022: Pt. is able to comb her hair, reaching to the back of her hair.. 10/20/2020: Pt. continues to progress wtith RRUE ROM, and is able to use a pic for the back of her head, however is unable to brush the back of her hair. 07/30/2020: Pt. is now able to comb the back of her hair with a pic, however continues to work on completing the task efficiently. 8/8: Achieves with great effort, has assistance for efficiancy. 02/16/2021: Pt. is now able to reach, and brush the back of her hair.    Time 12    Status Partially Met    Target Date 04/06/21      OT LONG TERM GOAL #7   Title Pt. will independently, and efficiently send text messages on her  phone.    Baseline 01/12/2022:: pt. is now able to type text messages on her phone. 10/20/2020: Pt. is now able to type a text message on the phone, however not efficiently taking increased time to complete. 07/30/2020: Pt. continues to present with limited BUE strength, and [facility] skills. 8/8: independtly drafts text messages, continues to work on accuracy and efficiancy 2/2 limited FMC.02/16/2021: pt. is now able to accessher phones,efficiently, and send text messages.    Time 12    Period Weeks    Status Achieved      OT LONG TERM GOAL #8   Title  Pt. will write, and sign her name with 100% legibility, and modified independence.    Baseline 01/12/2021: Pt. continnues to fill out her daily menu and complete puzzles. 10/20/2020: Pt. continues to make progress overall. Pt. continues to consistently fill out her daily menu, and writing for crossword puzzles. Pt. is able to maintain grasp on a wide width pen. [place] to work on [employer]. 8/8: writes daily, continues to work on improving speed. 02/16/2021: Pt. continues to present wiht limited writing legibility, fluctuating daily.    Time 12    Period Weeks    Status Partially Met    Target Date 04/06/21                   Plan - 02/25/21 1438     Clinical Impression Statement Pt. continues to make excellent progress, and is improving with reaching further with her BUEs in preparation for improving overhead ADLs, grooming/haircare, and writing. Pt. was able to tolerate the UBE without difficulty. Pt. tolerated ROM well without reports of pain, or discomfort. Pt. continues to present with bilateral wrist flexor tightness, and digit extensor tightness. Pt. continues to benefit from working on impoving ROM in order to work towards increasing engagement of her bilateral hands during ADLs, and IADL tasks.        OT Occupational Profile and History Detailed Assessment- Review of Records and additional review of physical, cognitive,  psychosocial history related to current functional performance    Occupational performance deficits (Please refer to evaluation for details): ADL's;IADL's;Leisure;Social Participation    Body Structure / Function / Physical Skills ADL;Continence;Dexterity;Flexibility;Strength;ROM;Balance;Coordination;FMC;IADL;Endurance;UE functional use;Decreased knowledge of use of DME;GMC    Psychosocial Skills Coping Strategies    Rehab Potential Fair    Clinical Decision Making Several treatment options, min-mod task modification necessary    Comorbidities Affecting Occupational Performance: Presence of comorbidities impacting occupational performance    Modification or Assistance to Complete Evaluation  No modification of tasks or assist necessary to complete eval    OT Frequency 2x / week    OT Duration 12 weeks    OT Treatment/Interventions Self-care/ADL training;Cryotherapy;Therapeutic exercise;DME and/or AE instruction;Balance training;Neuromuscular education;Manual Therapy;Splinting;Moist Heat;Passive range of motion;Therapeutic activities;Patient/family education    Plan continue to progress ROM of digits, wrists and shoulders as it pertains to completion of ADL tasks that pt values.    Consulted and Agree with Plan of Care Patient             Patient will benefit from skilled therapeutic intervention in order to improve the following deficits and impairments:   Body Structure / Function / Physical Skills: ADL, Continence, Dexterity, Flexibility, Strength, ROM, Balance, Coordination, FMC, IADL, Endurance, UE functional use, Decreased knowledge of use of DME, GMC   Psychosocial Skills: Coping Strategies   Visit Diagnosis: Muscle weakness (generalized)    Problem List Patient Active Problem List   Diagnosis Date Noted   Acute appendicitis with appendiceal abscess 05/30/2018   Hypocalcemia 02/15/2018   Dysuria 02/15/2018   Bilateral lower extremity edema 02/11/2018   Vaginal yeast  infection 01/30/2018   Chest tightness 01/27/2018   Healthcare-associated pneumonia 01/25/2018   Pleural effusion, not elsewhere classified 01/25/2018   Acute deep vein thrombosis (DVT) of left lower extremity ([place]) 01/24/2018   Acute deep vein thrombosis (DVT) of right lower extremity ([place]) 01/24/2018   Chronic allergic rhinitis 01/24/2018   Depression with anxiety 01/24/2018   UTI due to Klebsiella species 01/24/2018   Protein-calorie malnutrition, severe ([place]) 01/24/2018   S/P  insertion of IVC (inferior vena caval) filter 01/24/2018   Reactive depression    Hypokalemia    Leukocytosis    Essential hypertension    Trauma    Tetraparesis ([facility])    Neuropathic pain    Neurogenic bowel    Neurogenic bladder    Benign essential HTN    Acute blood loss anemia    Central cord syndrome at C6 level of cervical spinal cord ([place]) 11/29/2017   Central cord syndrome [facility]) 11/29/2017    Harrel Carina, MS, OTR/L 02/25/2021, 5:19 PM  [place] MAIN [facility] SERVICES [address] [place], [place], [address] Phone: [phone]   Fax:  [phone]  Name: JANAIYAH BLACKARD MRN: [id number] Date of Birth: 24-Jun-1934

## 2021-02-25 NOTE — Therapy (Signed)
[place] MAIN [facility] SERVICES [address] [place], [place], [address] Phone: [phone]   Fax:  [phone]  Physical Therapy Treatment  Patient Details  Name: Sherry [name] MRN: [id number] Date of Birth: 1934/11/04 No data recorded  Encounter Date: 02/25/2021   PT End of Session - 02/25/21 1321     Visit Number 126    Number of Visits 136    Date for PT Re-Evaluation 03/16/21    Authorization Type [employer]na medicare FOTO performed by PT on eval (7/20), score 12, Progress note on 03/10/2020; PN on 08/20/2020    Authorization Time Period Recert 07/03/3760- 10/02/5174; PN on 02/07/735; Recert 02/07/2692- 85/46/2703; PN on 5/00/9381; Recert 82/99/3716- 9/67/8938    PT Start Time 1145    PT Stop Time 1229    PT Time Calculation (min) 44 min    Equipment Utilized During Treatment Gait belt    Activity Tolerance Patient tolerated treatment well    Behavior During Therapy [facility] for tasks assessed/performed             Past Medical History:  Diagnosis Date   Acute blood loss anemia    Arthritis    Cancer ([place])    skin   Central cord syndrome at C6 level of cervical spinal cord ([place]) 11/29/2017   Hypertension    Protein-calorie malnutrition, severe ([place]) 01/24/2018   S/P insertion of IVC (inferior vena caval) filter 01/24/2018   Tetraparesis ([place])     Past Surgical History:  Procedure Laterality Date   ANTERIOR CERVICAL DECOMP/DISCECTOMY FUSION N/A 11/29/2017   Procedure: Cervical five-six, six-seven Anterior Cervical Decompression Fusion;  Surgeon: Judith Part, MD;  Location: [place];  Service: Neurosurgery;  Laterality: N/A;  Cervical five-six, six-seven Anterior Cervical Decompression Fusion   CATARACT EXTRACTION     IR IVC FILTER PLMT / S&I /IMG GUID/MOD SED  12/08/2017    There were no vitals filed for this visit.   Subjective Assessment - 02/25/21 1320     Subjective Patient continues to deny any pain today. Reports ready to  try to stand again.    Pertinent History Pt is an 87 yo female that fell in 2019, fractured vertebrae in her neck and in her low back per family. 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 After hospital stay, pt discharged to [facility] for ~1 month, experienced severe UTI as well as bilateral DVT (IVC filter placed). Discharged to [facility], stayed for about a year, and then moved to [facility] in April 2021. Was receiving PT, but per family facility reported that did not have adequate equipment to maximize PT for her. Pt until about 1 month ago was practicing sit to stand transfers with 1-2 people, and working on static standing. Has a brace for L foot due to PF. Pt currently needs assistance with all ADLs (able to assist with donning/doffing her shirt), bed baths, and needs a hoyer lift for transfers. Able to drive power wheelchair without assistance.    Currently in Pain? No/denies            INTERVENTIONS:   Therapeutic Activities:  Sit to stand: Max Assist for 1st trial and only Mod assist after for remaining 4 stands.    Standing:  1)Static stand: 2 min 26 sec  2) Static stand: 42mn 50 sec 3) Dynamic Lateral weight shift x 25 (no reported pain left hip)  4) Dynamic A/P weight shift x 20 Reps (using side mirror for feedback so patient  can see her hips moving)  4) Dynamic Lateral weight shift x 25 (no reported pain left hip)  Education provided throughout session via VC/TC and demonstration to facilitate movement at target joints and correct muscle activation for all testing and exercises performed.                             PT Education - 02/25/21 1321     Education Details Exercise technique    Person(s) Educated Patient    Methods Explanation;Demonstration;Tactile cues;Verbal cues    Comprehension Verbalized understanding;Returned demonstration;Verbal cues required;Tactile cues required;Need further instruction              PT  Short Term Goals - 03/10/20 2211       PT SHORT TERM GOAL #1   Title The patient will perform initial HEP with minimum assistance in order to improve strength and function.    Baseline Patient demonstrating independence with initial HEP as of 03/10/2020 with no questions or difficulty.    Time 4    Period Weeks    Status Achieved    Target Date 02/11/20               PT Long Term Goals - 12/22/20 1155       PT LONG TERM GOAL #1   Title The patient will be compliant with finalized HEP with minimum assistance in preparation for self management and maintenance of condition.    Baseline 03/10/2020- Patient familiar with initial HEP and will keep goal active as HEP becomes more progressive including possible transfers and standing activities. 04/07/2020- Patient continues to report compliance with LE strengthening home exercises without questions or concerns. 04/21/2020- Patient able to verbalize and demonstrate good understanding of current HEP including some supine and seated LE strengthening exercises. Reviewed and patient performed 10 reps today with minimal cueing. Will keep goal active to progress HEP as appropriate. 05/28/2020- patient reports continues to perform LE strengthening as able and some help from caregiver as able. No changes at this time to home program as patient is limited to supine/seated therex. 07/02/2020- Patient continues to report compliance with current supine and seated LE strengtheing home program and states no questions or concerns regarding her plan.    Time 12    Period Weeks    Status Achieved      PT LONG TERM GOAL #2   Title The patient will demonstrate at least 10 point improvement on FOTO score indicating an improved ability to perform functional activities.    Baseline on eval (/720) score 12; 10/22/19: 12; 12/10/19: 18, 12/13: 18: 04/07/2020- Will assess next visit. 07/02/2020- Will obtain next visit. 09/24/2020= Will obtain next visit. 11/12/2020= 12; 12/22/2020=  12    Time 12    Period Weeks    Status On-going    Target Date 03/16/21      PT LONG TERM GOAL #3   Title The patient will demonstrate lateral scooting  for assistance with transfers with minA to maximize independence.    Baseline 10/22/19: Pt requires modA+1 for lateral scooting; 12/10/19: modA+1, 12/13: modA-maxA, 12/27: modA-maxA, 03/10/2020- ModA-MaxA- increased verbal cues and visual demonstration. 04/07/2020- Continued Max assist to perform forward/lateral scoot. 04/21/2020- Patient was able to demo slight lateral scoot with CGA approx 12 in then fatigued requiring max assist- she is able to scoot forward by leaning without significant issues. 05/28/2020- While sitting in chair- patient able to perform forward and lateral scoot  with VC/visual demo and increased time allowed- CGA today but not consistent yet- will keep goal active at this time to continue to focus on strengthening and improving technique. 07/02/2020- Patient able to demonstrate minimal ability to laterally shift weigh on mat table requiring max assist yet is able to forward scoot out to edge of mat table with min Assist. 08/20/2020 - Patient was able to demonstrate minimal lateral scooting at edge of mat while holding feet still so they did not slip on tile surface. She continues to be able to demonstrate scoot forward and uses tilt option to recline to position safely back into seat of power wheelchair. 09/25/2020- Patient able to sit on mat and minimally scoot laterally but requires min/mod A for efficient scooting. Will end goal at this time due to plateau in progress with scooting over extended time.    Time 12    Period Weeks    Status Not Met      PT LONG TERM GOAL #4   Title The patient will demonstrate a squat pivot transfer with minimum assistance to maximize independence and mobility.    Baseline 10/22/19: unable to safely attempt at this time; 12/10/19: unable to safely attempt at this time, 01/14/20: unable to safety attempt at  this time. 03/10/2020- Patient able to perform Stand pivot transfer with Maximal assist. 04/07/2020- Patient continues to require Max assist with sit to stand- will assist SPT next visit. 04/21/2020- Patient presents with Max assist to perform Sit to stand Transfer- unable to pivot or move Left LE well without difficulty. 07/02/2020= Max assist with SPT and minimal ability to pivot. 7/202/2022-Patient able to stand with minimal assist this visit (as good as CGA last visit) and able to move right foot to pivot and performing with moderate assist today. 09/24/2020- Patient able to perform sit to stand with Min assist from lift chair. Patient has demo inconsistency and will keep goal active.  12/17/2020- Goal is currently not appropriate as patient fractured her ankle and currently NWB. 12/22/2020=Goal is currently not appropriate as patient fractured her ankle and currently NWB.    Time 12    Period Weeks    Status On-going    Target Date 03/16/21      PT LONG TERM GOAL #5   Title The patient will demonstrate sitting without UE support for 2-5 minutes at EOB to improve participation and maximize independence with ADLs.    Baseline 10/22/19: Pt able to sit unsupported for at least 2 minutes at edge of bed. 04/21/2020- Patient continues to demo good sitting at edge of mat > 5 miin without difficulty or back support.    Time 12    Period Weeks    Status Achieved      Additional Long Term Goals   Additional Long Term Goals Yes      PT LONG TERM GOAL #6   Title Patient will demonstrate improved functional LE strength as seen by consistent ability to stand > 2 min (3 out of 4 trials) with Mod/Max A for improved LE strength with transfers.    Baseline 05/28/2020- Patient able to inconsistent stand 1-2 min right now in //bars with Mod/Max A. 07/02/2020- Patient was able to progress last 2 visit to standing using bilateral platform attachment between 1-2 min which is a progression from the parallel bars. 08/20/2020-  Patient performed static standing with min- mod A using bilateral platform attachment on walker for 6 min 30 sec today with assist for trunk control and intermittent  verbal cues for posture. 09/24/2020- Patient has consistently been able to stand >2 min in // bars and when using bilateral Platform walker. 12/23/2020= Will reopen this goal as patient should transition back to weightbearing and again practice standing in new cert.    Time 12    Period Weeks    Status Deferred    Target Date 03/16/21      PT LONG TERM GOAL #7   Title Patient/caregiver will demonstrate assisted transfer using [place] Lift consistently Independently for improved ability to transfer at home from lift chair to bed.    Baseline 09/24/2020- Patient requires hoyer lift (Dependent) for safe transfers.  12/17/2020- Patient currently still dependent on hoyer as she is currently non-weight bearing due to recent ankle fracture.    Time 12    Period Weeks    Status On-going    Target Date 03/16/21      PT LONG TERM GOAL #8   Title Patient will demo improved right LE quad strength as seen by ability to extend knee to 20 deg from 0 or less while performing a Long arc quad (for improved terminal knee ext and more full ROM)  for improved ability to bearing weight with Right LE.    Baseline 12/22/2020=able to extend knee to approx 35 deg from zero during long arc quad    Time 12    Period Weeks    Status New    Target Date 03/16/20                   Plan - 02/25/21 1322     Clinical Impression Statement Patient presented today with excellent motivation and participated well overall with no report of any Left hip pain with standing. She was able to progress from initial safe sit to stand with Max Assist so as not to hurt her hip to ability to stand on left LE without any report of pain. She progressed to standing with less physical assist- mod assist and later able to progress to more lateral weight shifting. Patient was  pleased with her ability to stand without pain. She was able to position hips more anteriorly today with less assistance. Pt will benefit from skilled physical therapy to increase mobility and strength to improve patient's quality of life    Personal Factors and Comorbidities Age;Time since onset of injury/illness/exacerbation;Comorbidity 3+;Fitness    Comorbidities HTN, quadriparesis, history of DVT, neurogenic bladder    Examination-Activity Limitations Bathing;Hygiene/Grooming;Squat;Bed Mobility;Lift;Bend;Stand;Engineer, manufacturing;Toileting;Self Feeding;Transfers;Continence;Sit;Dressing;Sleep;Carry    Examination-Participation Restrictions Church;Laundry;Cleaning;Medication Management;Community Activity;Meal Prep;Interpersonal Relationship    Stability/Clinical Decision Making Evolving/Moderate complexity    Rehab Potential Fair    PT Frequency 2x / week    PT Duration 12 weeks    PT Treatment/Interventions ADLs/Self Care Home Management;Electrical Stimulation;Therapeutic activities;Wheelchair mobility training;Vasopneumatic Device;Joint Manipulations;Vestibular;Passive range of motion;Patient/family education;Therapeutic exercise;DME Instruction;Biofeedback;Aquatic Therapy;Moist Heat;Gait training;Balance training;Orthotic Fit/Training;Dry needling;Energy conservation;Taping;Splinting;Neuromuscular re-education;Cryotherapy;Ultrasound;Functional mobility training    PT Next Visit Plan Continue with progressive standing/transfer training once MD approved    PT Home Exercise Plan no changes    Consulted and Agree with Plan of Care Patient             Patient will benefit from skilled therapeutic intervention in order to improve the following deficits and impairments:  Abnormal gait, Decreased balance, Decreased endurance, Decreased mobility, Difficulty walking, Hypomobility, Impaired sensation, Decreased range of motion, Improper body mechanics, Impaired perceived functional ability,  Decreased activity tolerance, Decreased knowledge of use of DME, Decreased safety awareness, Decreased strength,  Impaired flexibility, Impaired UE functional use, Postural dysfunction  Visit Diagnosis: Muscle weakness (generalized)  Unsteadiness on feet  Difficulty in walking, not elsewhere classified     Problem List Patient Active Problem List   Diagnosis Date Noted   Acute appendicitis with appendiceal abscess 05/30/2018   Hypocalcemia 02/15/2018   Dysuria 02/15/2018   Bilateral lower extremity edema 02/11/2018   Vaginal yeast infection 01/30/2018   Chest tightness 01/27/2018   Healthcare-associated pneumonia 01/25/2018   Pleural effusion, not elsewhere classified 01/25/2018   Acute deep vein thrombosis (DVT) of left lower extremity ([place]) 01/24/2018   Acute deep vein thrombosis (DVT) of right lower extremity ([place]) 01/24/2018   Chronic allergic rhinitis 01/24/2018   Depression with anxiety 01/24/2018   UTI due to Klebsiella species 01/24/2018   Protein-calorie malnutrition, severe ([place]) 01/24/2018   S/P insertion of IVC (inferior vena caval) filter 01/24/2018   Reactive depression    Hypokalemia    Leukocytosis    Essential hypertension    Trauma    Tetraparesis ([facility])    Neuropathic pain    Neurogenic bowel    Neurogenic bladder    Benign essential HTN    Acute blood loss anemia    Central cord syndrome at C6 level of cervical spinal cord ([place]) 11/29/2017   Central cord syndrome ([place]) 11/29/2017    Lewis Moccasin, PT 02/25/2021, 1:24 PM  [place] MAIN [facility] SERVICES [place], [place], [address] Phone: [phone]   Fax:  [phone]  Name: Sherry [name] MRN: [id number] Date of Birth: May 01, 1934

## 2021-03-02 ENCOUNTER — Ambulatory Visit: Payer: Medicare HMO | Admitting: Occupational Therapy

## 2021-03-02 ENCOUNTER — Other Ambulatory Visit: Payer: Self-pay

## 2021-03-02 ENCOUNTER — Ambulatory Visit: Payer: Medicare HMO

## 2021-03-02 ENCOUNTER — Encounter: Payer: Self-pay | Admitting: Occupational Therapy

## 2021-03-02 DIAGNOSIS — R2681 Unsteadiness on feet: Secondary | ICD-10-CM

## 2021-03-02 DIAGNOSIS — R262 Difficulty in walking, not elsewhere classified: Secondary | ICD-10-CM

## 2021-03-02 DIAGNOSIS — M6281 Muscle weakness (generalized): Secondary | ICD-10-CM

## 2021-03-02 NOTE — Therapy (Signed)
[name] [address] [place], [place], [address] Phone: [phone]   Fax:  [phone]  Occupational Therapy Treatment  Patient Details  Name: Sherry [name] MRN: [id number] Date of Birth: 09-Apr-1934 No data recorded  Encounter Date: 03/02/2021   OT End of Session - 03/02/21 1719     Visit Number 104    Number of Visits 130    Date for OT Re-Evaluation 04/06/21    Authorization Time Period Progress report period starting 11/26/2020    OT Start Time 1100    OT Stop Time 1145    OT Time Calculation (min) 45 min    Activity Tolerance Patient tolerated treatment well    Behavior During Therapy [facility] for tasks assessed/performed             Past Medical History:  Diagnosis Date   Acute blood loss anemia    Arthritis    Cancer ([place])    skin   Central cord syndrome at C6 level of cervical spinal cord ([place]) 11/29/2017   Hypertension    Protein-calorie malnutrition, severe ([place]) 01/24/2018   S/P insertion of IVC (inferior vena caval) filter 01/24/2018   Tetraparesis ([place])     Past Surgical History:  Procedure Laterality Date   ANTERIOR CERVICAL DECOMP/DISCECTOMY FUSION N/A 11/29/2017   Procedure: Cervical five-six, six-seven Anterior Cervical Decompression Fusion;  Surgeon: Judith Part, MD;  Location: [facility];  Service: Neurosurgery;  Laterality: N/A;  Cervical five-six, six-seven Anterior Cervical Decompression Fusion   CATARACT EXTRACTION     IR IVC FILTER PLMT / S&I /IMG GUID/MOD SED  12/08/2017    There were no vitals filed for this visit.   Subjective Assessment - 03/02/21 1719     Subjective  Pt. reports no changes since the last visit.    Patient is accompanied by: Family member    Pertinent History Patient s/p fall November 29, 2017 resulting in diagnosis of central cord syndrome at C 6 level.  She has had therapy in multiple venues but with recent move to [facility], therapy staff recommended  she seek outpatient therapy for her needs.    Patient Stated Goals Patient would like to be able to move better in bed, stand and perform self care tasks.    Currently in Pain? No/denies            OT TREATMENT     Pt. tolerated bilateral shoulder flexion, extension, abduction, elbow flexion, extension forearm supination AAROM/AROM. PROM bilateral wrist extension, digit MP, PIP, and DIP flexion, and extension, thumb radial, and palmar abduction in conjunction with moist heat. Emphasis was placed on left wrist extension, bilateral thumb radial, and palmar abduction, and bilateral digit MP, PIP, and DIP flexion. Pt. worked on [employer], and reciprocal motion using the UBE while sitting for 8 min. with minimal resistance. Pt. worked on functional reaching with the bilateral UEs while crossing midline to the left and right to place them at a lower surface height.   Pt. continues to make progress, and continues to  improve with reaching further with her BUEs in preparation for improving overhead ADLs, grooming/haircare, and writing. Pt. was able to tolerate the UBE without difficulty with increasing resistance. Pt. tolerated ROM well without reports of pain, or discomfort. Pt. continues to present with bilateral wrist flexor tightness, and digit extensor tightness. Pt. continues to benefit from working on impoving ROM in order to work towards increasing engagement of her bilateral hands during  ADLs, and IADL tasks.                            OT Education - 03/02/21 1719     Education Details BUE functioning, and strengtheing    Person(s) Educated Patient;Caregiver(s)    Methods Explanation;Demonstration;Verbal cues    Comprehension Verbalized understanding;Need further instruction;Verbal cues required                 OT Long Term Goals - 02/16/21 1111       OT LONG TERM GOAL #1   Title Patient and caregiver will demonstrate understanding of home exercise  program for ROM.    Baseline Pt.'s current restorative aide is retiring at the end of December. Pt. continues to have a restorative aide rehab aide assist her wih exercises at [facility]. 8/8: Independently completes/directs HEP mulitple times/week. 12/17/2020: Pt. has a new restorative aide who will be resuming ROM program.    Time 12    Period Weeks    Status On-going    Target Date 04/06/21      OT LONG TERM GOAL #2   Title Patient will demonstrate ability to don shirt with min assist from seated position.      OT LONG TERM GOAL #4   Title Patient will demonstrate improved composite finger flexion to hold deodorant to apply to underarms.    Baseline 01/12/2021:  Pt. is progressing, and is able to reach to grasp, and hold narrow cones. 10/20/2020: Pt. conitnues to make steady progress with bilateral digit flexion.Pt. continues to present with stiifness/tightness which is hindering pt.'s ability to formulate a full composite fist. Pt. continues to present with improving finger flexion of R hand for a partial gross grasp, but unable to grasp and hold items. pt. conitnues to present with limited digit flexion of L hand. Pt. has active left thumb abduction. 8/8: holds cup with 2 hands, continues to have limited composite finger flexion L worse than R. 02/16/2021: pt. continues to present with limited bilateral gross composite fisting.    Time 12    Period Weeks    Status On-going    Target Date 04/06/21      OT LONG TERM GOAL #6   Title Patient will increase right UE ROM to comb the back of her hair with modified independence.    Baseline 01/12/2022: Pt. is able to comb her hair, reaching to the back of her hair.. 10/20/2020: Pt. continues to progress wtith RRUE ROM, and is able to use a pic for the back of her head, however is unable to brush the back of her hair. 07/30/2020: Pt. is now able to comb the back of her hair with a pic, however continues to work on completing the task efficiently. 8/8:  Achieves with great effort, has assistance for efficiancy. 02/16/2021: Pt. is now able to reach, and brush the back of her hair.    Time 12    Status Partially Met    Target Date 04/06/21      OT LONG TERM GOAL #7   Title Pt. will independently, and efficiently send text messages on her phone.    Baseline 01/12/2022:: pt. is now able to type text messages on her phone. 10/20/2020: Pt. is now able to type a text message on the phone, however not efficiently taking increased time to complete. 07/30/2020: Pt. continues to present with limited BUE strength, and [facility] skills. 8/8: independtly drafts text messages,  continues to work on accuracy and efficiancy 2/2 limited FMC.02/16/2021: pt. is now able to accessher phones,efficiently, and send text messages.    Time 12    Period Weeks    Status Achieved      OT LONG TERM GOAL #8   Title Pt. will write, and sign her name with 100% legibility, and modified independence.    Baseline 01/12/2021: Pt. continnues to fill out her daily menu and complete puzzles. 10/20/2020: Pt. continues to make progress overall. Pt. continues to consistently fill out her daily menu, and writing for crossword puzzles. Pt. is able to maintain grasp on a wide width pen. [place]ine[place] to work on [employer]. 8/8: writes daily, continues to work on improving speed. 02/16/2021: Pt. continues to present wiht limited writing legibility, fluctuating daily.    Time 12    Period Weeks    Status Partially Met    Target Date 04/06/21                   Plan - 03/02/21 1720     Clinical Impression Statement Pt. continues to make progress, and continues to  improve with reaching further with her BUEs in preparation for improving overhead ADLs, grooming/haircare, and writing. Pt. was able to tolerate the UBE without difficulty with increasing resistance. Pt. tolerated ROM well without reports of pain, or discomfort. Pt. continues to present with bilateral wrist flexor tightness, and  digit extensor tightness. Pt. continues to benefit from working on impoving ROM in order to work towards increasing engagement of her bilateral hands during ADLs, and IADL tasks.        OT Occupational Profile and History Detailed Assessment- Review of Records and additional review of physical, cognitive, psychosocial history related to current functional performance    Occupational performance deficits (Please refer to evaluation for details): ADL's;IADL's;Leisure;Social Participation    Body Structure / Function / Physical Skills ADL;Continence;Dexterity;Flexibility;Strength;ROM;Balance;Coordination;FMC;IADL;Endurance;UE functional use;Decreased knowledge of use of DME;GMC    Psychosocial Skills Coping Strategies    Rehab Potential Fair    Clinical Decision Making Several treatment options, min-mod task modification necessary    Comorbidities Affecting Occupational Performance: Presence of comorbidities impacting occupational performance    Modification or Assistance to Complete Evaluation  No modification of tasks or assist necessary to complete eval    OT Frequency 2x / week    OT Duration 12 weeks    OT Treatment/Interventions Self-care/ADL training;Cryotherapy;Therapeutic exercise;DME and/or AE instruction;Balance training;Neuromuscular education;Manual Therapy;Splinting;Moist Heat;Passive range of motion;Therapeutic activities;Patient/family education    Plan continue to progress ROM of digits, wrists and shoulders as it pertains to completion of ADL tasks that pt values.    Consulted and Agree with Plan of Care Patient             Patient will benefit from skilled therapeutic intervention in order to improve the following deficits and impairments:   Body Structure / Function / Physical Skills: ADL, Continence, Dexterity, Flexibility, Strength, ROM, Balance, Coordination, FMC, IADL, Endurance, UE functional use, Decreased knowledge of use of DME, GMC   Psychosocial Skills: Coping  Strategies   Visit Diagnosis: Muscle weakness (generalized)    Problem List Patient Active Problem List   Diagnosis Date Noted   Acute appendicitis with appendiceal abscess 05/30/2018   Hypocalcemia 02/15/2018   Dysuria 02/15/2018   Bilateral lower extremity edema 02/11/2018   Vaginal yeast infection 01/30/2018   Chest tightness 01/27/2018   Healthcare-associated pneumonia 01/25/2018   Pleural effusion, not elsewhere classified 01/25/2018   Acute  deep vein thrombosis (DVT) of left lower extremity ([place]) 01/24/2018   Acute deep vein thrombosis (DVT) of right lower extremity ([place]) 01/24/2018   Chronic allergic rhinitis 01/24/2018   Depression with anxiety 01/24/2018   UTI due to Klebsiella species 01/24/2018   Protein-calorie malnutrition, severe ([place]) 01/24/2018   S/P insertion of IVC (inferior vena caval) filter 01/24/2018   Reactive depression    Hypokalemia    Leukocytosis    Essential hypertension    Trauma    Tetraparesis ([facility])    Neuropathic pain    Neurogenic bowel    Neurogenic bladder    Benign essential HTN    Acute blood loss anemia    Central cord syndrome at C6 level of cervical spinal cord ([place]) 11/29/2017   Central cord syndrome [facility]) 11/29/2017   Harrel Carina, MS, OTR/L   Harrel Carina, OT 03/02/2021, 5:21 PM  [name] SERVICES [address] [place], [place], [address] Phone: [phone]   Fax:  [phone]  Name: Sherry [name] MRN: [id number] Date of Birth: Jun 03, 1934

## 2021-03-02 NOTE — Therapy (Signed)
[place] MAIN [facility] SERVICES [address] [place], [place], [address] Phone: [phone]   Fax:  [phone]  Physical Therapy Treatment  Patient Details  Name: Sherry [name] MRN: [id number] Date of Birth: 1934/09/07 No data recorded  Encounter Date: 03/02/2021   PT End of Session - 03/02/21 1955     Visit Number 127    Number of Visits 136    Date for PT Re-Evaluation 03/16/21    Authorization Type [employer] medicare FOTO performed by PT on eval (7/20), score 12, Progress note on 03/10/2020; PN on 08/20/2020    Authorization Time Period Recert 08/04/1636- 4/53/6468; PN on 0/04/2120; Recert 4/82/5003- 70/48/8891; PN on 6/94/5038; Recert 88/28/0034- 10/18/9148    PT Start Time 1146    PT Stop Time 1226    PT Time Calculation (min) 40 min    Equipment Utilized During Treatment Gait belt    Activity Tolerance Patient tolerated treatment well    Behavior During Therapy [facility] for tasks assessed/performed             Past Medical History:  Diagnosis Date   Acute blood loss anemia    Arthritis    Cancer ([place])    skin   Central cord syndrome at C6 level of cervical spinal cord ([place]) 11/29/2017   Hypertension    Protein-calorie malnutrition, severe ([place]) 01/24/2018   S/P insertion of IVC (inferior vena caval) filter 01/24/2018   Tetraparesis ([place])     Past Surgical History:  Procedure Laterality Date   ANTERIOR CERVICAL DECOMP/DISCECTOMY FUSION N/A 11/29/2017   Procedure: Cervical five-six, six-seven Anterior Cervical Decompression Fusion;  Surgeon: Judith Part, MD;  Location: [place];  Service: Neurosurgery;  Laterality: N/A;  Cervical five-six, six-seven Anterior Cervical Decompression Fusion   CATARACT EXTRACTION     IR IVC FILTER PLMT / S&I /IMG GUID/MOD SED  12/08/2017    There were no vitals filed for this visit.   Subjective Assessment - 03/02/21 1953     Subjective Patient reports feeling better and denies any pain with left  hip at home even with turning in bed.    Pertinent History Pt is an 87 yo female that fell in 2019, fractured vertebrae in her neck and in her low back per family. 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 After hospital stay, pt discharged to [facility] for ~1 month, experienced severe UTI as well as bilateral DVT (IVC filter placed). Discharged to [facility], stayed for about a year, and then moved to [facility] in April 2021. Was receiving PT, but per family facility reported that did not have adequate equipment to maximize PT for her. Pt until about 1 month ago was practicing sit to stand transfers with 1-2 people, and working on static standing. Has a brace for L foot due to PF. Pt currently needs assistance with all ADLs (able to assist with donning/doffing her shirt), bed baths, and needs a hoyer lift for transfers. Able to drive power wheelchair without assistance.    Currently in Pain? No/denies               INTERVENTIONS:    Therapeutic Activities:  Sit to stand: Mod Assist today for all standing.      Standing for endurance and for weight shifting ability.  1)Static stand: 2 min 42 sec- Patient slide left foot slightly out in front and was able to slide it back underneath her today with Right knee blocked-   2) Static  stand: 2 min 50 sec 3) Dynamic Lateral weight shift (no reported pain left hip) followed by static stand for total of 2 min 3 sec 4) Dynamic Lateral weight shift x 25 (no reported pain left hip) for total of 2 min 36 sec - able to stand with more erect posture and able to extend hips and maintain well throughout standing today.    Education provided throughout session via VC/TC and demonstration to facilitate movement at target joints and correct muscle activation for all testing and exercises performed.                           PT Education - 03/02/21 1954     Education Details Exercise technique    Person(s) Educated  Patient    Methods Explanation;Demonstration;Tactile cues;Verbal cues    Comprehension Verbalized understanding;Returned demonstration;Verbal cues required;Tactile cues required;Need further instruction              PT Short Term Goals - 03/10/20 2211       PT SHORT TERM GOAL #1   Title The patient will perform initial HEP with minimum assistance in order to improve strength and function.    Baseline Patient demonstrating independence with initial HEP as of 03/10/2020 with no questions or difficulty.    Time 4    Period Weeks    Status Achieved    Target Date 02/11/20               PT Long Term Goals - 12/22/20 1155       PT LONG TERM GOAL #1   Title The patient will be compliant with finalized HEP with minimum assistance in preparation for self management and maintenance of condition.    Baseline 03/10/2020- Patient familiar with initial HEP and will keep goal active as HEP becomes more progressive including possible transfers and standing activities. 04/07/2020- Patient continues to report compliance with LE strengthening home exercises without questions or concerns. 04/21/2020- Patient able to verbalize and demonstrate good understanding of current HEP including some supine and seated LE strengthening exercises. Reviewed and patient performed 10 reps today with minimal cueing. Will keep goal active to progress HEP as appropriate. 05/28/2020- patient reports continues to perform LE strengthening as able and some help from caregiver as able. No changes at this time to home program as patient is limited to supine/seated therex. 07/02/2020- Patient continues to report compliance with current supine and seated LE strengtheing home program and states no questions or concerns regarding her plan.    Time 12    Period Weeks    Status Achieved      PT LONG TERM GOAL #2   Title The patient will demonstrate at least 10 point improvement on FOTO score indicating an improved ability to perform  functional activities.    Baseline on eval (/720) score 12; 10/22/19: 12; 12/10/19: 18, 12/13: 18: 04/07/2020- Will assess next visit. 07/02/2020- Will obtain next visit. 09/24/2020= Will obtain next visit. 11/12/2020= 12; 12/22/2020= 12    Time 12    Period Weeks    Status On-going    Target Date 03/16/21      PT LONG TERM GOAL #3   Title The patient will demonstrate lateral scooting  for assistance with transfers with minA to maximize independence.    Baseline 10/22/19: Pt requires modA+1 for lateral scooting; 12/10/19: modA+1, 12/13: modA-maxA, 12/27: modA-maxA, 03/10/2020- ModA-MaxA- increased verbal cues and visual demonstration. 04/07/2020- Continued Max assist to  perform forward/lateral scoot. 04/21/2020- Patient was able to demo slight lateral scoot with CGA approx 12 in then fatigued requiring max assist- she is able to scoot forward by leaning without significant issues. 05/28/2020- While sitting in chair- patient able to perform forward and lateral scoot with VC/visual demo and increased time allowed- CGA today but not consistent yet- will keep goal active at this time to continue to focus on strengthening and improving technique. 07/02/2020- Patient able to demonstrate minimal ability to laterally shift weigh on mat table requiring max assist yet is able to forward scoot out to edge of mat table with min Assist. 08/20/2020 - Patient was able to demonstrate minimal lateral scooting at edge of mat while holding feet still so they did not slip on tile surface. She continues to be able to demonstrate scoot forward and uses tilt option to recline to position safely back into seat of power wheelchair. 09/25/2020- Patient able to sit on mat and minimally scoot laterally but requires min/mod A for efficient scooting. Will end goal at this time due to plateau in progress with scooting over extended time.    Time 12    Period Weeks    Status Not Met      PT LONG TERM GOAL #4   Title The patient will demonstrate a  squat pivot transfer with minimum assistance to maximize independence and mobility.    Baseline 10/22/19: unable to safely attempt at this time; 12/10/19: unable to safely attempt at this time, 01/14/20: unable to safety attempt at this time. 03/10/2020- Patient able to perform Stand pivot transfer with Maximal assist. 04/07/2020- Patient continues to require Max assist with sit to stand- will assist SPT next visit. 04/21/2020- Patient presents with Max assist to perform Sit to stand Transfer- unable to pivot or move Left LE well without difficulty. 07/02/2020= Max assist with SPT and minimal ability to pivot. 7/202/2022-Patient able to stand with minimal assist this visit (as good as CGA last visit) and able to move right foot to pivot and performing with moderate assist today. 09/24/2020- Patient able to perform sit to stand with Min assist from lift chair. Patient has demo inconsistency and will keep goal active.  12/17/2020- Goal is currently not appropriate as patient fractured her ankle and currently NWB. 12/22/2020=Goal is currently not appropriate as patient fractured her ankle and currently NWB.    Time 12    Period Weeks    Status On-going    Target Date 03/16/21      PT LONG TERM GOAL #5   Title The patient will demonstrate sitting without UE support for 2-5 minutes at EOB to improve participation and maximize independence with ADLs.    Baseline 10/22/19: Pt able to sit unsupported for at least 2 minutes at edge of bed. 04/21/2020- Patient continues to demo good sitting at edge of mat > 5 miin without difficulty or back support.    Time 12    Period Weeks    Status Achieved      Additional Long Term Goals   Additional Long Term Goals Yes      PT LONG TERM GOAL #6   Title Patient will demonstrate improved functional LE strength as seen by consistent ability to stand > 2 min (3 out of 4 trials) with Mod/Max A for improved LE strength with transfers.    Baseline 05/28/2020- Patient able to  inconsistent stand 1-2 min right now in //bars with Mod/Max A. 07/02/2020- Patient was able to progress last 2  visit to standing using bilateral platform attachment between 1-2 min which is a progression from the parallel bars. 08/20/2020- Patient performed static standing with min- mod A using bilateral platform attachment on walker for 6 min 30 sec today with assist for trunk control and intermittent verbal cues for posture. 09/24/2020- Patient has consistently been able to stand >2 min in // bars and when using bilateral Platform walker. 12/23/2020= Will reopen this goal as patient should transition back to weightbearing and again practice standing in new cert.    Time 12    Period Weeks    Status Deferred    Target Date 03/16/21      PT LONG TERM GOAL #7   Title Patient/caregiver will demonstrate assisted transfer using [place] Lift consistently Independently for improved ability to transfer at home from lift chair to bed.    Baseline 09/24/2020- Patient requires hoyer lift (Dependent) for safe transfers.  12/17/2020- Patient currently still dependent on hoyer as she is currently non-weight bearing due to recent ankle fracture.    Time 12    Period Weeks    Status On-going    Target Date 03/16/21      PT LONG TERM GOAL #8   Title Patient will demo improved right LE quad strength as seen by ability to extend knee to 20 deg from 0 or less while performing a Long arc quad (for improved terminal knee ext and more full ROM)  for improved ability to bearing weight with Right LE.    Baseline 12/22/2020=able to extend knee to approx 35 deg from zero during long arc quad    Time 12    Period Weeks    Status New    Target Date 03/16/20                   Plan - 03/02/21 1955     Clinical Impression Statement Patient presented with good motivation for today's session. She was able to stand well with mod assist and had several bout of stands- consistent > 2 min even with some added weight shifts  today. Pt will benefit from skilled physical therapy to increase mobility and strength to improve patient's quality of life    Personal Factors and Comorbidities Age;Time since onset of injury/illness/exacerbation;Comorbidity 3+;Fitness    Comorbidities HTN, quadriparesis, history of DVT, neurogenic bladder    Examination-Activity Limitations Bathing;Hygiene/Grooming;Squat;Bed Mobility;Lift;Bend;Stand;Engineer, manufacturing;Toileting;Self Feeding;Transfers;Continence;Sit;Dressing;Sleep;Carry    Examination-Participation Restrictions Church;Laundry;Cleaning;Medication Management;Community Activity;Meal Prep;Interpersonal Relationship    Stability/Clinical Decision Making Evolving/Moderate complexity    Rehab Potential Fair    PT Frequency 2x / week    PT Duration 12 weeks    PT Treatment/Interventions ADLs/Self Care Home Management;Electrical Stimulation;Therapeutic activities;Wheelchair mobility training;Vasopneumatic Device;Joint Manipulations;Vestibular;Passive range of motion;Patient/family education;Therapeutic exercise;DME Instruction;Biofeedback;Aquatic Therapy;Moist Heat;Gait training;Balance training;Orthotic Fit/Training;Dry needling;Energy conservation;Taping;Splinting;Neuromuscular re-education;Cryotherapy;Ultrasound;Functional mobility training    PT Next Visit Plan Continue with progressive standing/transfer training once MD approved    PT Home Exercise Plan no changes    Consulted and Agree with Plan of Care Patient             Patient will benefit from skilled therapeutic intervention in order to improve the following deficits and impairments:  Abnormal gait, Decreased balance, Decreased endurance, Decreased mobility, Difficulty walking, Hypomobility, Impaired sensation, Decreased range of motion, Improper body mechanics, Impaired perceived functional ability, Decreased activity tolerance, Decreased knowledge of use of DME, Decreased safety awareness, Decreased strength,  Impaired flexibility, Impaired UE functional use, Postural dysfunction  Visit Diagnosis: Muscle weakness (generalized)  Difficulty in walking, not elsewhere classified  Unsteadiness on feet     Problem List Patient Active Problem List   Diagnosis Date Noted   Acute appendicitis with appendiceal abscess 05/30/2018   Hypocalcemia 02/15/2018   Dysuria 02/15/2018   Bilateral lower extremity edema 02/11/2018   Vaginal yeast infection 01/30/2018   Chest tightness 01/27/2018   Healthcare-associated pneumonia 01/25/2018   Pleural effusion, not elsewhere classified 01/25/2018   Acute deep vein thrombosis (DVT) of left lower extremity ([place]) 01/24/2018   Acute deep vein thrombosis (DVT) of right lower extremity ([place]) 01/24/2018   Chronic allergic rhinitis 01/24/2018   Depression with anxiety 01/24/2018   UTI due to Klebsiella species 01/24/2018   Protein-calorie malnutrition, severe ([place]) 01/24/2018   S/P insertion of IVC (inferior vena caval) filter 01/24/2018   Reactive depression    Hypokalemia    Leukocytosis    Essential hypertension    Trauma    Tetraparesis ([facility])    Neuropathic pain    Neurogenic bowel    Neurogenic bladder    Benign essential HTN    Acute blood loss anemia    Central cord syndrome at C6 level of cervical spinal cord ([place]) 11/29/2017   Central cord syndrome ([place]) 11/29/2017    Lewis Moccasin, PT 03/02/2021, 8:11 PM  [place], [place], [address] Phone: [phone]   Fax:  [phone]  Name: Sherry [name] MRN: [id number] Date of Birth: 1934/06/18

## 2021-03-04 ENCOUNTER — Other Ambulatory Visit: Payer: Self-pay

## 2021-03-04 ENCOUNTER — Encounter: Payer: Self-pay | Admitting: Occupational Therapy

## 2021-03-04 ENCOUNTER — Ambulatory Visit: Payer: Medicare HMO | Attending: Internal Medicine | Admitting: Occupational Therapy

## 2021-03-04 ENCOUNTER — Ambulatory Visit: Payer: Medicare HMO

## 2021-03-04 DIAGNOSIS — M6281 Muscle weakness (generalized): Secondary | ICD-10-CM | POA: Insufficient documentation

## 2021-03-04 DIAGNOSIS — R262 Difficulty in walking, not elsewhere classified: Secondary | ICD-10-CM | POA: Diagnosis present

## 2021-03-04 DIAGNOSIS — R2681 Unsteadiness on feet: Secondary | ICD-10-CM | POA: Diagnosis present

## 2021-03-04 NOTE — Therapy (Signed)
[place] MAIN [facility] SERVICES [address] [place], [place], [address] Phone: [phone]   Fax:  [phone]  Occupational Therapy Treatment  Patient Details  Name: Sherry [name] MRN: [id number] Date of Birth: Apr 28, 1934 No data recorded  Encounter Date: 03/04/2021   OT End of Session - 03/04/21 1341     Visit Number 105    Number of Visits 130    Date for OT Re-Evaluation 04/06/21    Authorization Time Period Progress report period starting 11/26/2020    OT Start Time 33    OT Stop Time 1145    OT Time Calculation (min) 45 min    Equipment Utilized During Treatment moist heat    Activity Tolerance Patient tolerated treatment well    Behavior During Therapy [facility] for tasks assessed/performed             Past Medical History:  Diagnosis Date   Acute blood loss anemia    Arthritis    Cancer ([place])    skin   Central cord syndrome at C6 level of cervical spinal cord ([place]) 11/29/2017   Hypertension    Protein-calorie malnutrition, severe ([place]) 01/24/2018   S/P insertion of IVC (inferior vena caval) filter 01/24/2018   Tetraparesis ([place])     Past Surgical History:  Procedure Laterality Date   ANTERIOR CERVICAL DECOMP/DISCECTOMY FUSION N/A 11/29/2017   Procedure: Cervical five-six, six-seven Anterior Cervical Decompression Fusion;  Surgeon: Judith Part, MD;  Location: [place];  Service: Neurosurgery;  Laterality: N/A;  Cervical five-six, six-seven Anterior Cervical Decompression Fusion   CATARACT EXTRACTION     IR IVC FILTER PLMT / S&I /IMG GUID/MOD SED  12/08/2017    There were no vitals filed for this visit.   Subjective Assessment - 03/04/21 1341     Subjective  Pt. reports doing well today.    Patient is accompanied by: Family member    Pertinent History Patient s/p fall November 29, 2017 resulting in diagnosis of central cord syndrome at C 6 level.  She has had therapy in multiple venues but with recent move to  [facility], therapy staff recommended she seek outpatient therapy for her needs.    Currently in Pain? No/denies            OT TREATMENT     Pt. tolerated bilateral shoulder flexion, extension, abduction, elbow flexion, extension forearm supination AAROM/AROM. PROM bilateral wrist extension, digit MP, PIP, and DIP flexion, and extension, thumb radial, and palmar abduction in conjunction with moist heat. Emphasis was placed on left wrist extension, bilateral thumb radial, and palmar abduction, and bilateral digit MP, PIP, and DIP flexion. Pt. worked on [employer], and reciprocal motion using the UBE while sitting for 8 min. with minimal resistance. Pt. worked on functional reaching with the bilateral UEs while crossing midline to the left and right to place them at a lower surface height.   Pt. continues to make progress, and continues to  improve with reaching further with her BUEs in preparation for improving overhead ADL tasks including: grooming/haircare, reaching, and writing. Pt. was able to tolerate the UBE without difficulty with increasing resistance. Pt. tolerated ROM well without reports of pain, or discomfort. Pt. continues to present with bilateral wrist flexor tightness, and digit MP, PIP, and DIP extensor tightness. Pt. continues to benefit from working on impoving ROM in order to work towards increasing engagement of her bilateral hands during ADLs, and IADL tasks.  OT Education - 03/04/21 1341     Education Details BUE functioning, and strengtheing    Person(s) Educated Patient;Caregiver(s)    Methods Explanation;Demonstration;Verbal cues    Comprehension Verbalized understanding;Need further instruction;Verbal cues required                 OT Long Term Goals - 02/16/21 1111       OT LONG TERM GOAL #1   Title Patient and caregiver will demonstrate understanding of home exercise program for ROM.    Baseline Pt.'s  current restorative aide is retiring at the end of December. Pt. continues to have a restorative aide rehab aide assist her wih exercises at [facility]. 8/8: Independently completes/directs HEP mulitple times/week. 12/17/2020: Pt. has a new restorative aide who will be resuming ROM program.    Time 12    Period Weeks    Status On-going    Target Date 04/06/21      OT LONG TERM GOAL #2   Title Patient will demonstrate ability to don shirt with min assist from seated position.      OT LONG TERM GOAL #4   Title Patient will demonstrate improved composite finger flexion to hold deodorant to apply to underarms.    Baseline 01/12/2021:  Pt. is progressing, and is able to reach to grasp, and hold narrow cones. 10/20/2020: Pt. conitnues to make steady progress with bilateral digit flexion.Pt. continues to present with stiifness/tightness which is hindering pt.'s ability to formulate a full composite fist. Pt. continues to present with improving finger flexion of R hand for a partial gross grasp, but unable to grasp and hold items. pt. conitnues to present with limited digit flexion of L hand. Pt. has active left thumb abduction. 8/8: holds cup with 2 hands, continues to have limited composite finger flexion L worse than R. 02/16/2021: pt. continues to present with limited bilateral gross composite fisting.    Time 12    Period Weeks    Status On-going    Target Date 04/06/21      OT LONG TERM GOAL #6   Title Patient will increase right UE ROM to comb the back of her hair with modified independence.    Baseline 01/12/2022: Pt. is able to comb her hair, reaching to the back of her hair.. 10/20/2020: Pt. continues to progress wtith RRUE ROM, and is able to use a pic for the back of her head, however is unable to brush the back of her hair. 07/30/2020: Pt. is now able to comb the back of her hair with a pic, however continues to work on completing the task efficiently. 8/8: Achieves with great effort, has  assistance for efficiancy. 02/16/2021: Pt. is now able to reach, and brush the back of her hair.    Time 12    Status Partially Met    Target Date 04/06/21      OT LONG TERM GOAL #7   Title Pt. will independently, and efficiently send text messages on her phone.    Baseline 01/12/2022:: pt. is now able to type text messages on her phone. 10/20/2020: Pt. is now able to type a text message on the phone, however not efficiently taking increased time to complete. 07/30/2020: Pt. continues to present with limited BUE strength, and [facility] skills. 8/8: independtly drafts text messages, continues to work on accuracy and efficiancy 2/2 limited FMC.02/16/2021: pt. is now able to accessher phones,efficiently, and send text messages.    Time 12    Period Weeks  Status Achieved      OT LONG TERM GOAL #8   Title Pt. will write, and sign her name with 100% legibility, and modified independence.    Baseline 01/12/2021: Pt. continnues to fill out her daily menu and complete puzzles. 10/20/2020: Pt. continues to make progress overall. Pt. continues to consistently fill out her daily menu, and writing for crossword puzzles. Pt. is able to maintain grasp on a wide width pen. [place] to work on [employer]. 8/8: writes daily, continues to work on improving speed. 02/16/2021: Pt. continues to present wiht limited writing legibility, fluctuating daily.    Time 12    Period Weeks    Status Partially Met    Target Date 04/06/21                   Plan - 03/04/21 1342     Clinical Impression Statement Pt. continues to make progress, and continues to  improve with reaching further with her BUEs in preparation for improving overhead ADL tasks including: grooming/haircare, reaching, and writing. Pt. was able to tolerate the UBE without difficulty with increasing resistance. Pt. tolerated ROM well without reports of pain, or discomfort. Pt. continues to present with bilateral wrist flexor tightness, and digit  MP, PIP, and DIP extensor tightness. Pt. continues to benefit from working on impoving ROM in order to work towards increasing engagement of her bilateral hands during ADLs, and IADL tasks.         OT Occupational Profile and History Detailed Assessment- Review of Records and additional review of physical, cognitive, psychosocial history related to current functional performance    Occupational performance deficits (Please refer to evaluation for details): ADL's;IADL's;Leisure;Social Participation    Body Structure / Function / Physical Skills ADL;Continence;Dexterity;Flexibility;Strength;ROM;Balance;Coordination;FMC;IADL;Endurance;UE functional use;Decreased knowledge of use of DME;GMC    Psychosocial Skills Coping Strategies    Rehab Potential Fair    Clinical Decision Making Several treatment options, min-mod task modification necessary    Comorbidities Affecting Occupational Performance: Presence of comorbidities impacting occupational performance    Modification or Assistance to Complete Evaluation  No modification of tasks or assist necessary to complete eval    OT Frequency 2x / week    OT Duration 12 weeks    OT Treatment/Interventions Self-care/ADL training;Cryotherapy;Therapeutic exercise;DME and/or AE instruction;Balance training;Neuromuscular education;Manual Therapy;Splinting;Moist Heat;Passive range of motion;Therapeutic activities;Patient/family education    Plan continue to progress ROM of digits, wrists and shoulders as it pertains to completion of ADL tasks that pt values.    Consulted and Agree with Plan of Care Patient             Patient will benefit from skilled therapeutic intervention in order to improve the following deficits and impairments:   Body Structure / Function / Physical Skills: ADL, Continence, Dexterity, Flexibility, Strength, ROM, Balance, Coordination, FMC, IADL, Endurance, UE functional use, Decreased knowledge of use of DME, GMC   Psychosocial  Skills: Coping Strategies   Visit Diagnosis: Muscle weakness (generalized)    Problem List Patient Active Problem List   Diagnosis Date Noted   Acute appendicitis with appendiceal abscess 05/30/2018   Hypocalcemia 02/15/2018   Dysuria 02/15/2018   Bilateral lower extremity edema 02/11/2018   Vaginal yeast infection 01/30/2018   Chest tightness 01/27/2018   Healthcare-associated pneumonia 01/25/2018   Pleural effusion, not elsewhere classified 01/25/2018   Acute deep vein thrombosis (DVT) of left lower extremity ([place]) 01/24/2018   Acute deep vein thrombosis (DVT) of right lower extremity ([place]) 01/24/2018   Chronic  allergic rhinitis 01/24/2018   Depression with anxiety 01/24/2018   UTI due to Klebsiella species 01/24/2018   Protein-calorie malnutrition, severe ([place]) 01/24/2018   S/P insertion of IVC (inferior vena caval) filter 01/24/2018   Reactive depression    Hypokalemia    Leukocytosis    Essential hypertension    Trauma    Tetraparesis ([facility])    Neuropathic pain    Neurogenic bowel    Neurogenic bladder    Benign essential HTN    Acute blood loss anemia    Central cord syndrome at C6 level of cervical spinal cord ([place]) 11/29/2017   Central cord syndrome [facility]) 11/29/2017   Harrel Carina, MS, OTR/L   Harrel Carina, OT 03/04/2021, 1:43 PM  [place] MAIN [facility] SERVICES [address] [place], [place], [address] Phone: [phone]   Fax:  [phone]  Name: Sherry [name] MRN: [id number] Date of Birth: 06-Jul-1934

## 2021-03-04 NOTE — Therapy (Signed)
[place] MAIN [facility] SERVICES [address] [place], [place], [address] Phone: [phone]   Fax:  [phone]  Physical Therapy Treatment  Patient Details  Name: Sherry [name] MRN: [id number] Date of Birth: Mar 16, 1934 No data recorded  Encounter Date: 03/04/2021   PT End of Session - 03/04/21 1135     Visit Number 128    Number of Visits 136    Date for PT Re-Evaluation 03/16/21    Authorization Type [employer] medicare FOTO performed by PT on eval (7/20), score 12, Progress note on 03/10/2020; PN on 08/20/2020    Authorization Time Period Recert 10/09/9209- 9/41/7408; PN on 02/05/4816; Recert 5/63/1497- 02/63/7858; PN on 8/50/2774; Recert 12/87/8676- 08/20/9468    PT Start Time 1145    PT Stop Time 1220    PT Time Calculation (min) 35 min    Equipment Utilized During Treatment Gait belt    Activity Tolerance Patient tolerated treatment well    Behavior During Therapy [facility] for tasks assessed/performed             Past Medical History:  Diagnosis Date   Acute blood loss anemia    Arthritis    Cancer ([place])    skin   Central cord syndrome at C6 level of cervical spinal cord ([place]) 11/29/2017   Hypertension    Protein-calorie malnutrition, severe ([place]) 01/24/2018   S/P insertion of IVC (inferior vena caval) filter 01/24/2018   Tetraparesis ([place])     Past Surgical History:  Procedure Laterality Date   ANTERIOR CERVICAL DECOMP/DISCECTOMY FUSION N/A 11/29/2017   Procedure: Cervical five-six, six-seven Anterior Cervical Decompression Fusion;  Surgeon: Judith Part, MD;  Location: [place];  Service: Neurosurgery;  Laterality: N/A;  Cervical five-six, six-seven Anterior Cervical Decompression Fusion   CATARACT EXTRACTION     IR IVC FILTER PLMT / S&I /IMG GUID/MOD SED  12/08/2017    There were no vitals filed for this visit.   Subjective Assessment - 03/04/21 1130     Subjective Patient denies any new complaints or issues since last visit.  Continues to report no left sided hip pain.    Pertinent History Pt is an 87 yo female that fell in 2019, fractured vertebrae in her neck and in her low back per family. 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 After hospital stay, pt discharged to [facility] for ~1 month, experienced severe UTI as well as bilateral DVT (IVC filter placed). Discharged to [facility], stayed for about a year, and then moved to [facility] in April 2021. Was receiving PT, but per family facility reported that did not have adequate equipment to maximize PT for her. Pt until about 1 month ago was practicing sit to stand transfers with 1-2 people, and working on static standing. Has a brace for L foot due to PF. Pt currently needs assistance with all ADLs (able to assist with donning/doffing her shirt), bed baths, and needs a hoyer lift for transfers. Able to drive power wheelchair without assistance.    Currently in Pain? No/denies              INTERVENTIONS:    Therapeutic Exercises:  Postural strengthening:  Scap retraction- RTB 2 sets of 12 reps  Horizontal ABD- RTB 2 sets of 12 reps  LE strengthening:   Seated ham curls- RTB- 2 sets of 12 reps  Seated knee ext: RTB on left and AROM on Right with eccentric control on right LE- 2 sets of 12 reps.  Seated manual resistive Hip abd/add - 3 sec hold x 2 sets x 12  Education provided throughout session via VC/TC and demonstration to facilitate movement at target joints and correct muscle activation for all testing and exercises performed.                       PT Education - 03/04/21 1130     Education Details Seated posture/exercise technique    Person(s) Educated Patient    Methods Explanation;Demonstration;Tactile cues    Comprehension Verbalized understanding;Returned demonstration;Verbal cues required;Tactile cues required;Need further instruction              PT Short Term Goals - 03/10/20 2211       PT  SHORT TERM GOAL #1   Title The patient will perform initial HEP with minimum assistance in order to improve strength and function.    Baseline Patient demonstrating independence with initial HEP as of 03/10/2020 with no questions or difficulty.    Time 4    Period Weeks    Status Achieved    Target Date 02/11/20               PT Long Term Goals - 12/22/20 1155       PT LONG TERM GOAL #1   Title The patient will be compliant with finalized HEP with minimum assistance in preparation for self management and maintenance of condition.    Baseline 03/10/2020- Patient familiar with initial HEP and will keep goal active as HEP becomes more progressive including possible transfers and standing activities. 04/07/2020- Patient continues to report compliance with LE strengthening home exercises without questions or concerns. 04/21/2020- Patient able to verbalize and demonstrate good understanding of current HEP including some supine and seated LE strengthening exercises. Reviewed and patient performed 10 reps today with minimal cueing. Will keep goal active to progress HEP as appropriate. 05/28/2020- patient reports continues to perform LE strengthening as able and some help from caregiver as able. No changes at this time to home program as patient is limited to supine/seated therex. 07/02/2020- Patient continues to report compliance with current supine and seated LE strengtheing home program and states no questions or concerns regarding her plan.    Time 12    Period Weeks    Status Achieved      PT LONG TERM GOAL #2   Title The patient will demonstrate at least 10 point improvement on FOTO score indicating an improved ability to perform functional activities.    Baseline on eval (/720) score 12; 10/22/19: 12; 12/10/19: 18, 12/13: 18: 04/07/2020- Will assess next visit. 07/02/2020- Will obtain next visit. 09/24/2020= Will obtain next visit. 11/12/2020= 12; 12/22/2020= 12    Time 12    Period Weeks    Status  On-going    Target Date 03/16/21      PT LONG TERM GOAL #3   Title The patient will demonstrate lateral scooting  for assistance with transfers with minA to maximize independence.    Baseline 10/22/19: Pt requires modA+1 for lateral scooting; 12/10/19: modA+1, 12/13: modA-maxA, 12/27: modA-maxA, 03/10/2020- ModA-MaxA- increased verbal cues and visual demonstration. 04/07/2020- Continued Max assist to perform forward/lateral scoot. 04/21/2020- Patient was able to demo slight lateral scoot with CGA approx 12 in then fatigued requiring max assist- she is able to scoot forward by leaning without significant issues. 05/28/2020- While sitting in chair- patient able to perform forward and lateral scoot with VC/visual demo and increased time allowed- CGA today but  not consistent yet- will keep goal active at this time to continue to focus on strengthening and improving technique. 07/02/2020- Patient able to demonstrate minimal ability to laterally shift weigh on mat table requiring max assist yet is able to forward scoot out to edge of mat table with min Assist. 08/20/2020 - Patient was able to demonstrate minimal lateral scooting at edge of mat while holding feet still so they did not slip on tile surface. She continues to be able to demonstrate scoot forward and uses tilt option to recline to position safely back into seat of power wheelchair. 09/25/2020- Patient able to sit on mat and minimally scoot laterally but requires min/mod A for efficient scooting. Will end goal at this time due to plateau in progress with scooting over extended time.    Time 12    Period Weeks    Status Not Met      PT LONG TERM GOAL #4   Title The patient will demonstrate a squat pivot transfer with minimum assistance to maximize independence and mobility.    Baseline 10/22/19: unable to safely attempt at this time; 12/10/19: unable to safely attempt at this time, 01/14/20: unable to safety attempt at this time. 03/10/2020- Patient able to  perform Stand pivot transfer with Maximal assist. 04/07/2020- Patient continues to require Max assist with sit to stand- will assist SPT next visit. 04/21/2020- Patient presents with Max assist to perform Sit to stand Transfer- unable to pivot or move Left LE well without difficulty. 07/02/2020= Max assist with SPT and minimal ability to pivot. 7/202/2022-Patient able to stand with minimal assist this visit (as good as CGA last visit) and able to move right foot to pivot and performing with moderate assist today. 09/24/2020- Patient able to perform sit to stand with Min assist from lift chair. Patient has demo inconsistency and will keep goal active.  12/17/2020- Goal is currently not appropriate as patient fractured her ankle and currently NWB. 12/22/2020=Goal is currently not appropriate as patient fractured her ankle and currently NWB.    Time 12    Period Weeks    Status On-going    Target Date 03/16/21      PT LONG TERM GOAL #5   Title The patient will demonstrate sitting without UE support for 2-5 minutes at EOB to improve participation and maximize independence with ADLs.    Baseline 10/22/19: Pt able to sit unsupported for at least 2 minutes at edge of bed. 04/21/2020- Patient continues to demo good sitting at edge of mat > 5 miin without difficulty or back support.    Time 12    Period Weeks    Status Achieved      Additional Long Term Goals   Additional Long Term Goals Yes      PT LONG TERM GOAL #6   Title Patient will demonstrate improved functional LE strength as seen by consistent ability to stand > 2 min (3 out of 4 trials) with Mod/Max A for improved LE strength with transfers.    Baseline 05/28/2020- Patient able to inconsistent stand 1-2 min right now in //bars with Mod/Max A. 07/02/2020- Patient was able to progress last 2 visit to standing using bilateral platform attachment between 1-2 min which is a progression from the parallel bars. 08/20/2020- Patient performed static standing with min-  mod A using bilateral platform attachment on walker for 6 min 30 sec today with assist for trunk control and intermittent verbal cues for posture. 09/24/2020- Patient has consistently been able  to stand >2 min in // bars and when using bilateral Platform walker. 12/23/2020= Will reopen this goal as patient should transition back to weightbearing and again practice standing in new cert.    Time 12    Period Weeks    Status Deferred    Target Date 03/16/21      PT LONG TERM GOAL #7   Title Patient/caregiver will demonstrate assisted transfer using [place] Lift consistently Independently for improved ability to transfer at home from lift chair to bed.    Baseline 09/24/2020- Patient requires hoyer lift (Dependent) for safe transfers.  12/17/2020- Patient currently still dependent on hoyer as she is currently non-weight bearing due to recent ankle fracture.    Time 12    Period Weeks    Status On-going    Target Date 03/16/21      PT LONG TERM GOAL #8   Title Patient will demo improved right LE quad strength as seen by ability to extend knee to 20 deg from 0 or less while performing a Long arc quad (for improved terminal knee ext and more full ROM)  for improved ability to bearing weight with Right LE.    Baseline 12/22/2020=able to extend knee to approx 35 deg from zero during long arc quad    Time 12    Period Weeks    Status New    Target Date 03/16/20                   Plan - 03/04/21 1135     Clinical Impression Statement Patient presented with good motivation and able to perform well with resistive theraband exercises with rest breaks as needed. She was able to exhibit improved overall eccentric control with right sided quads today. No pain and only limited by fatigue today. Pt will benefit from skilled physical therapy to increase mobility and strength to improve patient's quality of life    Personal Factors and Comorbidities Age;Time since onset of  injury/illness/exacerbation;Comorbidity 3+;Fitness    Comorbidities HTN, quadriparesis, history of DVT, neurogenic bladder    Examination-Activity Limitations Bathing;Hygiene/Grooming;Squat;Bed Mobility;Lift;Bend;Stand;Engineer, manufacturing;Toileting;Self Feeding;Transfers;Continence;Sit;Dressing;Sleep;Carry    Examination-Participation Restrictions Church;Laundry;Cleaning;Medication Management;Community Activity;Meal Prep;Interpersonal Relationship    Stability/Clinical Decision Making Evolving/Moderate complexity    Rehab Potential Fair    PT Frequency 2x / week    PT Duration 12 weeks    PT Treatment/Interventions ADLs/Self Care Home Management;Electrical Stimulation;Therapeutic activities;Wheelchair mobility training;Vasopneumatic Device;Joint Manipulations;Vestibular;Passive range of motion;Patient/family education;Therapeutic exercise;DME Instruction;Biofeedback;Aquatic Therapy;Moist Heat;Gait training;Balance training;Orthotic Fit/Training;Dry needling;Energy conservation;Taping;Splinting;Neuromuscular re-education;Cryotherapy;Ultrasound;Functional mobility training    PT Next Visit Plan Continue with progressive standing/transfer training once MD approved    PT Home Exercise Plan no changes    Consulted and Agree with Plan of Care Patient             Patient will benefit from skilled therapeutic intervention in order to improve the following deficits and impairments:  Abnormal gait, Decreased balance, Decreased endurance, Decreased mobility, Difficulty walking, Hypomobility, Impaired sensation, Decreased range of motion, Improper body mechanics, Impaired perceived functional ability, Decreased activity tolerance, Decreased knowledge of use of DME, Decreased safety awareness, Decreased strength, Impaired flexibility, Impaired UE functional use, Postural dysfunction  Visit Diagnosis: Muscle weakness (generalized)  Difficulty in walking, not elsewhere classified  Unsteadiness  on feet     Problem List Patient Active Problem List   Diagnosis Date Noted   Acute appendicitis with appendiceal abscess 05/30/2018   Hypocalcemia 02/15/2018   Dysuria 02/15/2018   Bilateral lower extremity edema 02/11/2018  Vaginal yeast infection 01/30/2018   Chest tightness 01/27/2018   Healthcare-associated pneumonia 01/25/2018   Pleural effusion, not elsewhere classified 01/25/2018   Acute deep vein thrombosis (DVT) of left lower extremity ([place]) 01/24/2018   Acute deep vein thrombosis (DVT) of right lower extremity ([place]) 01/24/2018   Chronic allergic rhinitis 01/24/2018   Depression with anxiety 01/24/2018   UTI due to Klebsiella species 01/24/2018   Protein-calorie malnutrition, severe ([place]) 01/24/2018   S/P insertion of IVC (inferior vena caval) filter 01/24/2018   Reactive depression    Hypokalemia    Leukocytosis    Essential hypertension    Trauma    Tetraparesis ([facility])    Neuropathic pain    Neurogenic bowel    Neurogenic bladder    Benign essential HTN    Acute blood loss anemia    Central cord syndrome at C6 level of cervical spinal cord ([place]) 11/29/2017   Central cord syndrome ([place]) 11/29/2017    Lewis Moccasin, PT 03/04/2021, 1:39 PM  [place] MAIN [facility] SERVICES [place], [place], [address] Phone: [phone]   Fax:  [phone]  Name: LEOMA FOLDS MRN: [id number] Date of Birth: 12/19/1934

## 2021-03-09 ENCOUNTER — Ambulatory Visit: Payer: Medicare HMO | Admitting: Occupational Therapy

## 2021-03-09 ENCOUNTER — Ambulatory Visit: Payer: Medicare HMO

## 2021-03-11 ENCOUNTER — Ambulatory Visit: Payer: Medicare HMO | Admitting: Occupational Therapy

## 2021-03-11 ENCOUNTER — Ambulatory Visit: Payer: Medicare HMO

## 2021-03-16 ENCOUNTER — Ambulatory Visit: Payer: Medicare HMO

## 2021-03-16 ENCOUNTER — Ambulatory Visit: Payer: Medicare HMO | Admitting: Occupational Therapy

## 2021-03-16 DIAGNOSIS — U071 COVID-19: Secondary | ICD-10-CM | POA: Diagnosis not present

## 2021-03-16 NOTE — Patient Instructions (Incomplete)
INTERVENTIONS:      Therapeutic Exercises:  Postural strengthening:   Scap retraction- RTB 2 sets of 12 reps   Horizontal ABD- RTB 2 sets of 12 reps   LE strengthening:    Seated ham curls- RTB- 2 sets of 12 reps   Seated knee ext: RTB on left and AROM on Right with eccentric control on right LE- 2 sets of 12 reps.    Seated manual resistive Hip abd/add - 3 sec hold x 2 sets x 12   Education provided throughout session via VC/TC and demonstration to facilitate movement at target joints and correct muscle activation for all testing and exercises performed.

## 2021-03-18 ENCOUNTER — Ambulatory Visit: Payer: Medicare HMO

## 2021-03-18 ENCOUNTER — Ambulatory Visit: Payer: Medicare HMO | Admitting: Occupational Therapy

## 2021-03-23 ENCOUNTER — Ambulatory Visit: Payer: Medicare HMO | Admitting: Occupational Therapy

## 2021-03-23 ENCOUNTER — Ambulatory Visit: Payer: Medicare HMO

## 2021-03-25 ENCOUNTER — Ambulatory Visit: Payer: Medicare HMO | Admitting: Occupational Therapy

## 2021-03-25 ENCOUNTER — Encounter: Payer: Self-pay | Admitting: Occupational Therapy

## 2021-03-25 ENCOUNTER — Ambulatory Visit: Payer: Medicare HMO

## 2021-03-25 NOTE — Therapy (Signed)
[place] MAIN [facility] SERVICES [address] [place], [place], [address] Phone: [phone]   Fax:  [phone]  Patient Details  Name: Sherry [name] MRN: [id number] Date of Birth: 1935/02/01 Referring Provider:  No ref. provider found  Encounter Date: 03/25/2021  Pt. did not arrive for her OT treatment session today. Pt. has cancelled previous appointments this past week due to being sick with COVID-19. Attempted to call the pt. At the number provided in the chart. A general message was left on voicemail with the phone number for our clinic.   Harrel Carina, MS, OTR/L   Harrel Carina, OT 03/25/2021, 11:19 AM  [place] MAIN [facility] SERVICES [address] [place], [place], [address] Phone: [phone]   Fax:  [phone]

## 2021-03-30 ENCOUNTER — Encounter: Payer: Self-pay | Admitting: Occupational Therapy

## 2021-03-30 ENCOUNTER — Ambulatory Visit: Payer: Medicare HMO | Admitting: Occupational Therapy

## 2021-03-30 ENCOUNTER — Ambulatory Visit: Payer: Medicare HMO

## 2021-03-30 ENCOUNTER — Other Ambulatory Visit: Payer: Self-pay

## 2021-03-30 DIAGNOSIS — M6281 Muscle weakness (generalized): Secondary | ICD-10-CM | POA: Diagnosis not present

## 2021-03-30 DIAGNOSIS — R2681 Unsteadiness on feet: Secondary | ICD-10-CM

## 2021-03-30 DIAGNOSIS — R262 Difficulty in walking, not elsewhere classified: Secondary | ICD-10-CM

## 2021-03-30 NOTE — Therapy (Signed)
[place] MAIN [facility] SERVICES [address] [place], [place], [address] Phone: [phone]   Fax:  [phone]  Occupational Therapy Treatment  Patient Details  Name: Sherry [name] MRN: [id number] Date of Birth: Jul 29, 1934 No data recorded  Encounter Date: 03/30/2021   OT End of Session - 03/30/21 1329     Visit Number 106    Number of Visits 130    Date for OT Re-Evaluation 04/06/21    Authorization Time Period Progress report period starting 11/26/2020    OT Start Time 1100    OT Stop Time 1145    OT Time Calculation (min) 45 min    Activity Tolerance Patient tolerated treatment well    Behavior During Therapy [facility] for tasks assessed/performed             Past Medical History:  Diagnosis Date   Acute blood loss anemia    Arthritis    Cancer ([place])    skin   Central cord syndrome at C6 level of cervical spinal cord ([place]) 11/29/2017   Hypertension    Protein-calorie malnutrition, severe ([place]) 01/24/2018   S/P insertion of IVC (inferior vena caval) filter 01/24/2018   Tetraparesis ([place])     Past Surgical History:  Procedure Laterality Date   ANTERIOR CERVICAL DECOMP/DISCECTOMY FUSION N/A 11/29/2017   Procedure: Cervical five-six, six-seven Anterior Cervical Decompression Fusion;  Surgeon: Judith Part, MD;  Location: [place];  Service: Neurosurgery;  Laterality: N/A;  Cervical five-six, six-seven Anterior Cervical Decompression Fusion   CATARACT EXTRACTION     IR IVC FILTER PLMT / S&I /IMG GUID/MOD SED  12/08/2017    There were no vitals filed for this visit.   Subjective Assessment - 03/30/21 1329     Subjective  Pt. reports doing well today, and is feeling better.   Patient is accompanied by: Family member    Pertinent History Patient s/p fall November 29, 2017 resulting in diagnosis of central cord syndrome at C 6 level.  She has had therapy in multiple venues but with recent move to [facility], therapy staff  recommended she seek outpatient therapy for her needs.    Currently in Pain? No/denies            OT TREATMENT     Pt. tolerated PROM bilateral wrist extension, digit MP, PIP, and DIP flexion, and extension, thumb radial, and palmar abduction in conjunction with moist heat. Emphasis was placed on left wrist extension, bilateral thumb radial, and palmar abduction, and bilateral digit MP, PIP, and DIP flexion. Pt. worked on [employer], and reciprocal motion using the UBE while sitting for 8 min. with minimal resistance.    Pt. has returned to the clinic after having had multiple cancellations secondary to being in quarantine due to COVID-19 over the past month. Pt. Presents with bilateral wrist, and digit tightness, and continues to work towards improving with reaching  with her BUEs in preparation for improving overhead ADL tasks including: grooming/haircare, reaching, and writing. Pt. was able to tolerate the UBE without difficulty with increasing resistance. Pt. tolerated ROM well without reports of pain, or discomfort. Pt. continues to present with bilateral wrist flexor tightness, and digit MP, PIP, and DIP extensor tightness. Pt. continues to benefit from working on impoving ROM in order to work towards increasing engagement of her bilateral hands during ADLs, and IADL tasks.  OT Education - 03/30/21 1329     Education Details BUE functioning, and strengtheing    Person(s) Educated Patient;Caregiver(s)    Methods Explanation;Demonstration;Verbal cues    Comprehension Verbalized understanding;Need further instruction;Verbal cues required                 OT Long Term Goals - 02/16/21 1111       OT LONG TERM GOAL #1   Title Patient and caregiver will demonstrate understanding of home exercise program for ROM.    Baseline Pt.'s current restorative aide is retiring at the end of December. Pt. continues to have a restorative aide  rehab aide assist her wih exercises at [facility]. 8/8: Independently completes/directs HEP mulitple times/week. 12/17/2020: Pt. has a new restorative aide who will be resuming ROM program.    Time 12    Period Weeks    Status On-going    Target Date 04/06/21      OT LONG TERM GOAL #2   Title Patient will demonstrate ability to don shirt with min assist from seated position.      OT LONG TERM GOAL #4   Title Patient will demonstrate improved composite finger flexion to hold deodorant to apply to underarms.    Baseline 01/12/2021:  Pt. is progressing, and is able to reach to grasp, and hold narrow cones. 10/20/2020: Pt. conitnues to make steady progress with bilateral digit flexion.Pt. continues to present with stiifness/tightness which is hindering pt.'s ability to formulate a full composite fist. Pt. continues to present with improving finger flexion of R hand for a partial gross grasp, but unable to grasp and hold items. pt. conitnues to present with limited digit flexion of L hand. Pt. has active left thumb abduction. 8/8: holds cup with 2 hands, continues to have limited composite finger flexion L worse than R. 02/16/2021: pt. continues to present with limited bilateral gross composite fisting.    Time 12    Period Weeks    Status On-going    Target Date 04/06/21      OT LONG TERM GOAL #6   Title Patient will increase right UE ROM to comb the back of her hair with modified independence.    Baseline 01/12/2022: Pt. is able to comb her hair, reaching to the back of her hair.. 10/20/2020: Pt. continues to progress wtith RRUE ROM, and is able to use a pic for the back of her head, however is unable to brush the back of her hair. 07/30/2020: Pt. is now able to comb the back of her hair with a pic, however continues to work on completing the task efficiently. 8/8: Achieves with great effort, has assistance for efficiancy. 02/16/2021: Pt. is now able to reach, and brush the back of her hair.    Time 12     Status Partially Met    Target Date 04/06/21      OT LONG TERM GOAL #7   Title Pt. will independently, and efficiently send text messages on her phone.    Baseline 01/12/2022:: pt. is now able to type text messages on her phone. 10/20/2020: Pt. is now able to type a text message on the phone, however not efficiently taking increased time to complete. 07/30/2020: Pt. continues to present with limited BUE strength, and [facility] skills. 8/8: independtly drafts text messages, continues to work on accuracy and efficiancy 2/2 limited FMC.02/16/2021: pt. is now able to accessher phones,efficiently, and send text messages.    Time 12    Period Weeks  Status Achieved      OT LONG TERM GOAL #8   Title Pt. will write, and sign her name with 100% legibility, and modified independence.    Baseline 01/12/2021: Pt. continnues to fill out her daily menu and complete puzzles. 10/20/2020: Pt. continues to make progress overall. Pt. continues to consistently fill out her daily menu, and writing for crossword puzzles. Pt. is able to maintain grasp on a wide width pen. [place] to work on [employer]. 8/8: writes daily, continues to work on improving speed. 02/16/2021: Pt. continues to present wiht limited writing legibility, fluctuating daily.    Time 12    Period Weeks    Status Partially Met    Target Date 04/06/21                   Plan - 03/30/21 1330     Clinical Impression Statement Pt. has returned to the clinic after having had multiple cancellations secondary to being in quarantine due to COVID-19 over the past month. Pt. Presents with bilateral wrist, and digit tightness, and continues to work towards improving with reaching  with her BUEs in preparation for improving overhead ADL tasks including: grooming/haircare, reaching, and writing. Pt. was able to tolerate the UBE without difficulty with increasing resistance. Pt. tolerated ROM well without reports of pain, or discomfort. Pt. continues  to present with bilateral wrist flexor tightness, and digit MP, PIP, and DIP extensor tightness. Pt. continues to benefit from working on impoving ROM in order to work towards increasing engagement of her bilateral hands during ADLs, and IADL tasks.       OT Occupational Profile and History Detailed Assessment- Review of Records and additional review of physical, cognitive, psychosocial history related to current functional performance    Occupational performance deficits (Please refer to evaluation for details): ADL's;IADL's;Leisure;Social Participation    Body Structure / Function / Physical Skills ADL;Continence;Dexterity;Flexibility;Strength;ROM;Balance;Coordination;FMC;IADL;Endurance;UE functional use;Decreased knowledge of use of DME;GMC    Psychosocial Skills Coping Strategies    Rehab Potential Fair    Clinical Decision Making Several treatment options, min-mod task modification necessary    Comorbidities Affecting Occupational Performance: Presence of comorbidities impacting occupational performance    Modification or Assistance to Complete Evaluation  No modification of tasks or assist necessary to complete eval    OT Frequency 2x / week    OT Duration 12 weeks    OT Treatment/Interventions Self-care/ADL training;Cryotherapy;Therapeutic exercise;DME and/or AE instruction;Balance training;Neuromuscular education;Manual Therapy;Splinting;Moist Heat;Passive range of motion;Therapeutic activities;Patient/family education    Plan continue to progress ROM of digits, wrists and shoulders as it pertains to completion of ADL tasks that pt values.    Consulted and Agree with Plan of Care Patient             Patient will benefit from skilled therapeutic intervention in order to improve the following deficits and impairments:   Body Structure / Function / Physical Skills: ADL, Continence, Dexterity, Flexibility, Strength, ROM, Balance, Coordination, FMC, IADL, Endurance, UE functional use,  Decreased knowledge of use of DME, GMC   Psychosocial Skills: Coping Strategies   Visit Diagnosis: Muscle weakness (generalized)    Problem List Patient Active Problem List   Diagnosis Date Noted   Acute appendicitis with appendiceal abscess 05/30/2018   Hypocalcemia 02/15/2018   Dysuria 02/15/2018   Bilateral lower extremity edema 02/11/2018   Vaginal yeast infection 01/30/2018   Chest tightness 01/27/2018   Healthcare-associated pneumonia 01/25/2018   Pleural effusion, not elsewhere classified 01/25/2018   Acute deep  vein thrombosis (DVT) of left lower extremity ([place]) 01/24/2018   Acute deep vein thrombosis (DVT) of right lower extremity ([place]) 01/24/2018   Chronic allergic rhinitis 01/24/2018   Depression with anxiety 01/24/2018   UTI due to Klebsiella species 01/24/2018   Protein-calorie malnutrition, severe ([place]) 01/24/2018   S/P insertion of IVC (inferior vena caval) filter 01/24/2018   Reactive depression    Hypokalemia    Leukocytosis    Essential hypertension    Trauma    Tetraparesis ([facility])    Neuropathic pain    Neurogenic bowel    Neurogenic bladder    Benign essential HTN    Acute blood loss anemia    Central cord syndrome at C6 level of cervical spinal cord ([place]) 11/29/2017   Central cord syndrome ([place]) 11/29/2017   Harrel Carina, MS, OTR/L   Harrel Carina, OT 03/30/2021, 1:31 PM  [place] MAIN [facility] SERVICES [address] [place], [place], [address] Phone: [phone]   Fax:  [phone]  Name: Sherry [name] MRN: [id number] Date of Birth: 11/13/1934

## 2021-03-30 NOTE — Therapy (Signed)
[place] MAIN [facility] SERVICES [address] [place], [place], [address] Phone: [phone]   Fax:  [phone]  Physical Therapy Treatment/Recertification for dates 03/30/2021- 06/22/2021  Patient Details  Name: Sherry [name] MRN: [id number] Date of Birth: 12-19-1934 No data recorded  Encounter Date: 03/30/2021   PT End of Session - 03/30/21 1149     Visit Number 129    Number of Visits 136    Date for PT Re-Evaluation 06/22/21    Authorization Type [employer] medicare FOTO performed by PT on eval (7/20), score 12, Progress note on 03/10/2020; PN on 08/20/2020    Authorization Time Period Recert 09/05/2776- 2/42/3536; PN on 02/04/4313; Recert 4/00/8676- 19/50/9326; PN on 08/13/4578; Recert 99/83/3825- 0/53/9767; Recert 3/41/9379-0/24/0973    PT Start Time 1145    PT Stop Time 1228    PT Time Calculation (min) 43 min    Equipment Utilized During Treatment Gait belt    Activity Tolerance Patient tolerated treatment well;Patient limited by fatigue    Behavior During Therapy [facility] for tasks assessed/performed             Past Medical History:  Diagnosis Date   Acute blood loss anemia    Arthritis    Cancer ([place])    skin   Central cord syndrome at C6 level of cervical spinal cord ([place]) 11/29/2017   Hypertension    Protein-calorie malnutrition, severe ([place]) 01/24/2018   S/P insertion of IVC (inferior vena caval) filter 01/24/2018   Tetraparesis ([place])     Past Surgical History:  Procedure Laterality Date   ANTERIOR CERVICAL DECOMP/DISCECTOMY FUSION N/A 11/29/2017   Procedure: Cervical five-six, six-seven Anterior Cervical Decompression Fusion;  Surgeon: Judith Part, MD;  Location: [place];  Service: Neurosurgery;  Laterality: N/A;  Cervical five-six, six-seven Anterior Cervical Decompression Fusion   CATARACT EXTRACTION     IR IVC FILTER PLMT / S&I /IMG GUID/MOD SED  12/08/2017    There were no vitals filed for this visit.   Subjective  Assessment - 03/30/21 1146     Subjective Patient reports missing last 3 weeks of PT due to recovering from [place]. States she is still low on endurance but agreeable to testing for recert visit.    Pertinent History Pt is an 87 yo female that fell in 2019, fractured vertebrae in her neck and in her low back per family. 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 After hospital stay, pt discharged to [facility] for ~1 month, experienced severe UTI as well as bilateral DVT (IVC filter placed). Discharged to [facility], stayed for about a year, and then moved to [facility] in April 2021. Was receiving PT, but per family facility reported that did not have adequate equipment to maximize PT for her. Pt until about 1 month ago was practicing sit to stand transfers with 1-2 people, and working on static standing. Has a brace for L foot due to PF. Pt currently needs assistance with all ADLs (able to assist with donning/doffing her shirt), bed baths, and needs a hoyer lift for transfers. Able to drive power wheelchair without assistance.    Currently in Pain? No/denies            Reassessed LTGs for recert:   FOTO: 17 (improved from 12)  Stand pivot transfer: Patient was able to demo min assist to stand holding onto PT back of arms yet continues to require max assist to pivot as she was unable to pivot or turn her  legs consistently today.     Static standing:   1) 1 min 23 sec  2)  75min 40 sec 3) 1 min 50 sec  Left LE hyperextended with increased difficulty tucking/extending hips today. Requiring more mod assist to remain standing.                         PT Education - 03/30/21 1407     Education Details postural cues and safety cues with transfers    Person(s) Educated Patient    Methods Explanation;Demonstration;Verbal cues    Comprehension Verbalized understanding;Returned demonstration;Verbal cues required;Need further instruction              PT  Short Term Goals - 03/10/20 2211       PT SHORT TERM GOAL #1   Title The patient will perform initial HEP with minimum assistance in order to improve strength and function.    Baseline Patient demonstrating independence with initial HEP as of 03/10/2020 with no questions or difficulty.    Time 4    Period Weeks    Status Achieved    Target Date 02/11/20               PT Long Term Goals - 03/30/21 1150       PT LONG TERM GOAL #1   Title The patient will be compliant with finalized HEP with minimum assistance in preparation for self management and maintenance of condition.    Baseline 03/10/2020- Patient familiar with initial HEP and will keep goal active as HEP becomes more progressive including possible transfers and standing activities. 04/07/2020- Patient continues to report compliance with LE strengthening home exercises without questions or concerns. 04/21/2020- Patient able to verbalize and demonstrate good understanding of current HEP including some supine and seated LE strengthening exercises. Reviewed and patient performed 10 reps today with minimal cueing. Will keep goal active to progress HEP as appropriate. 05/28/2020- patient reports continues to perform LE strengthening as able and some help from caregiver as able. No changes at this time to home program as patient is limited to supine/seated therex. 07/02/2020- Patient continues to report compliance with current supine and seated LE strengtheing home program and states no questions or concerns regarding her plan.    Time 12    Period Weeks    Status Achieved      PT LONG TERM GOAL #2   Title The patient will demonstrate at least 10 point improvement on FOTO score indicating an improved ability to perform functional activities.    Baseline on eval (/720) score 12; 10/22/19: 12; 12/10/19: 18, 12/13: 18: 04/07/2020- Will assess next visit. 07/02/2020- Will obtain next visit. 09/24/2020= Will obtain next visit. 11/12/2020= 12; 12/22/2020=  12; 03/30/2021= 17    Time 12    Period Weeks    Status On-going    Target Date 06/22/21      PT LONG TERM GOAL #3   Title The patient will demonstrate lateral scooting  for assistance with transfers with minA to maximize independence.    Baseline 10/22/19: Pt requires modA+1 for lateral scooting; 12/10/19: modA+1, 12/13: modA-maxA, 12/27: modA-maxA, 03/10/2020- ModA-MaxA- increased verbal cues and visual demonstration. 04/07/2020- Continued Max assist to perform forward/lateral scoot. 04/21/2020- Patient was able to demo slight lateral scoot with CGA approx 12 in then fatigued requiring max assist- she is able to scoot forward by leaning without significant issues. 05/28/2020- While sitting in chair- patient able to perform forward and lateral  scoot with VC/visual demo and increased time allowed- CGA today but not consistent yet- will keep goal active at this time to continue to focus on strengthening and improving technique. 07/02/2020- Patient able to demonstrate minimal ability to laterally shift weigh on mat table requiring max assist yet is able to forward scoot out to edge of mat table with min Assist. 08/20/2020 - Patient was able to demonstrate minimal lateral scooting at edge of mat while holding feet still so they did not slip on tile surface. She continues to be able to demonstrate scoot forward and uses tilt option to recline to position safely back into seat of power wheelchair. 09/25/2020- Patient able to sit on mat and minimally scoot laterally but requires min/mod A for efficient scooting. Will end goal at this time due to plateau in progress with scooting over extended time.    Time 12    Period Weeks    Status Not Met      PT LONG TERM GOAL #4   Title The patient will demonstrate a squat pivot transfer with minimum assistance to maximize independence and mobility.    Baseline 10/22/19: unable to safely attempt at this time; 12/10/19: unable to safely attempt at this time, 01/14/20: unable to  safety attempt at this time. 03/10/2020- Patient able to perform Stand pivot transfer with Maximal assist. 04/07/2020- Patient continues to require Max assist with sit to stand- will assist SPT next visit. 04/21/2020- Patient presents with Max assist to perform Sit to stand Transfer- unable to pivot or move Left LE well without difficulty. 07/02/2020= Max assist with SPT and minimal ability to pivot. 7/202/2022-Patient able to stand with minimal assist this visit (as good as CGA last visit) and able to move right foot to pivot and performing with moderate assist today. 09/24/2020- Patient able to perform sit to stand with Min assist from lift chair. Patient has demo inconsistency and will keep goal active.  12/17/2020- Goal is currently not appropriate as patient fractured her ankle and currently NWB. 12/22/2020=Goal is currently not appropriate as patient fractured her ankle and currently NWB.  03/30/2021=Patient was able to demo min assist to stand holding onto PT back of arms yet continues to require max assist to pivot as she was unable to pivot or turn her legs consistently today.    Time 12    Period Weeks    Status On-going    Target Date 06/22/21      PT LONG TERM GOAL #5   Title The patient will demonstrate sitting without UE support for 2-5 minutes at EOB to improve participation and maximize independence with ADLs.    Baseline 10/22/19: Pt able to sit unsupported for at least 2 minutes at edge of bed. 04/21/2020- Patient continues to demo good sitting at edge of mat > 5 miin without difficulty or back support.    Time 12    Period Weeks    Status Achieved      PT LONG TERM GOAL #6   Title Patient will demonstrate improved functional LE strength as seen by consistent ability to stand > 2 min (3 out of 4 trials) with Mod/Max A for improved LE strength with transfers.    Baseline 05/28/2020- Patient able to inconsistent stand 1-2 min right now in //bars with Mod/Max A. 07/02/2020- Patient was able to  progress last 2 visit to standing using bilateral platform attachment between 1-2 min which is a progression from the parallel bars. 08/20/2020- Patient performed static standing with min-  mod A using bilateral platform attachment on walker for 6 min 30 sec today with assist for trunk control and intermittent verbal cues for posture. 09/24/2020- Patient has consistently been able to stand >2 min in // bars and when using bilateral Platform walker. 12/23/2020= Will reopen this goal as patient should transition back to weightbearing and again practice standing in new cert. 03/30/2021=1) 1 min 23 sec   2)  55mn 40 sec  3) 1 min 50 sec   Left LE hyperextended with increased difficulty tucking/extending hips today. Requiring more mod assist to remain standing.    Time 12    Period Weeks    Status Deferred    Target Date 06/22/21      PT LONG TERM GOAL #7   Title Patient/caregiver will demonstrate assisted transfer using [place] Lift consistently Independently for improved ability to transfer at home from lift chair to bed.    Baseline 09/24/2020- Patient requires hoyer lift (Dependent) for safe transfers.  12/17/2020- Patient currently still dependent on hoyer as she is currently non-weight bearing due to recent ankle fracture. 03/30/2021- Patient stated she has not tried. Agreeable to attempt in near future.    Time 12    Period Weeks    Status On-going    Target Date 06/22/21      PT LONG TERM GOAL #8   Title Patient will demo improved right LE quad strength as seen by ability to extend knee to 20 deg from 0 or less while performing a Long arc quad (for improved terminal knee ext and more full ROM)  for improved ability to bearing weight with Right LE.    Baseline 12/22/2020=able to extend knee to approx 35 deg from zero during long arc quad; 2/27/203- WIll reassess next visit    Time 12    Period Weeks    Status New    Target Date 06/22/21                   Plan - 03/30/21 1412      Clinical Impression Statement Patient presents with excellent motivation and reports excited to be back in PT after 3 week long battle with COVID. She reports being exhausted overall but willing to reassess as able today. She did have some decline which would be directly related to weakness associated with COVID. She was limited with standing and transfer yet still able to perform with assistance. Now that she is feeling better  her condition has the potential to improve in response to therapy. Maximum improvement is yet to be obtained. The anticipated improvement is attainable and reasonable in a generally predictable time.    Personal Factors and Comorbidities Age;Time since onset of injury/illness/exacerbation;Comorbidity 3+;Fitness    Comorbidities HTN, quadriparesis, history of DVT, neurogenic bladder    Examination-Activity Limitations Bathing;Hygiene/Grooming;Squat;Bed Mobility;Lift;Bend;Stand;LEngineer, manufacturingToileting;Self Feeding;Transfers;Continence;Sit;Dressing;Sleep;Carry    Examination-Participation Restrictions Church;Laundry;Cleaning;Medication Management;Community Activity;Meal Prep;Interpersonal Relationship    Stability/Clinical Decision Making Evolving/Moderate complexity    Rehab Potential Fair    PT Frequency 2x / week    PT Duration 12 weeks    PT Treatment/Interventions ADLs/Self Care Home Management;Electrical Stimulation;Therapeutic activities;Wheelchair mobility training;Vasopneumatic Device;Joint Manipulations;Vestibular;Passive range of motion;Patient/family education;Therapeutic exercise;DME Instruction;Biofeedback;Aquatic Therapy;Moist Heat;Gait training;Balance training;Orthotic Fit/Training;Dry needling;Energy conservation;Taping;Splinting;Neuromuscular re-education;Cryotherapy;Ultrasound;Functional mobility training    PT Next Visit Plan Continue with progressive standing/transfer training; Progress LE Strengthening    PT Home Exercise Plan no changes     Consulted and Agree with Plan of Care Patient  Patient will benefit from skilled therapeutic intervention in order to improve the following deficits and impairments:  Abnormal gait, Decreased balance, Decreased endurance, Decreased mobility, Difficulty walking, Hypomobility, Impaired sensation, Decreased range of motion, Improper body mechanics, Impaired perceived functional ability, Decreased activity tolerance, Decreased knowledge of use of DME, Decreased safety awareness, Decreased strength, Impaired flexibility, Impaired UE functional use, Postural dysfunction  Visit Diagnosis: Difficulty in walking, not elsewhere classified  Muscle weakness (generalized)  Unsteadiness on feet     Problem List Patient Active Problem List   Diagnosis Date Noted   Acute appendicitis with appendiceal abscess 05/30/2018   Hypocalcemia 02/15/2018   Dysuria 02/15/2018   Bilateral lower extremity edema 02/11/2018   Vaginal yeast infection 01/30/2018   Chest tightness 01/27/2018   Healthcare-associated pneumonia 01/25/2018   Pleural effusion, not elsewhere classified 01/25/2018   Acute deep vein thrombosis (DVT) of left lower extremity ([place]) 01/24/2018   Acute deep vein thrombosis (DVT) of right lower extremity ([place]) 01/24/2018   Chronic allergic rhinitis 01/24/2018   Depression with anxiety 01/24/2018   UTI due to Klebsiella species 01/24/2018   Protein-calorie malnutrition, severe ([place]) 01/24/2018   S/P insertion of IVC (inferior vena caval) filter 01/24/2018   Reactive depression    Hypokalemia    Leukocytosis    Essential hypertension    Trauma    Tetraparesis ([facility])    Neuropathic pain    Neurogenic bowel    Neurogenic bladder    Benign essential HTN    Acute blood loss anemia    Central cord syndrome at C6 level of cervical spinal cord ([place]) 11/29/2017   Central cord syndrome ([place]) 11/29/2017    Lewis Moccasin, PT 03/30/2021, 2:27 PM  [place] MAIN [facility] SERVICES [address] [place], [place], [address] Phone: [phone]   Fax:  [phone]  Name: DIVYA MUNSHI MRN: [id number] Date of Birth: [birth year]

## 2021-04-01 ENCOUNTER — Ambulatory Visit: Payer: Medicare HMO | Attending: Internal Medicine | Admitting: Occupational Therapy

## 2021-04-01 ENCOUNTER — Ambulatory Visit: Payer: Medicare HMO

## 2021-04-01 ENCOUNTER — Other Ambulatory Visit: Payer: Self-pay

## 2021-04-01 ENCOUNTER — Encounter: Payer: Self-pay | Admitting: Occupational Therapy

## 2021-04-01 DIAGNOSIS — R2689 Other abnormalities of gait and mobility: Secondary | ICD-10-CM | POA: Insufficient documentation

## 2021-04-01 DIAGNOSIS — R2681 Unsteadiness on feet: Secondary | ICD-10-CM | POA: Diagnosis present

## 2021-04-01 DIAGNOSIS — R262 Difficulty in walking, not elsewhere classified: Secondary | ICD-10-CM | POA: Insufficient documentation

## 2021-04-01 DIAGNOSIS — M6281 Muscle weakness (generalized): Secondary | ICD-10-CM

## 2021-04-01 DIAGNOSIS — R278 Other lack of coordination: Secondary | ICD-10-CM | POA: Diagnosis present

## 2021-04-01 DIAGNOSIS — R269 Unspecified abnormalities of gait and mobility: Secondary | ICD-10-CM | POA: Insufficient documentation

## 2021-04-01 NOTE — Therapy (Signed)
[place] [facility] SERVICES [address] [place], [place], [address] Phone: [phone]   Fax:  [phone]  Occupational Therapy Treatment  Patient Details  Name: ZHANNA MELIN MRN: [id number] Date of Birth: 11-Sep-1934 No data recorded  Encounter Date: 04/01/2021   OT End of Session - 04/01/21 1554     Visit Number 107    Number of Visits 130    Date for OT Re-Evaluation 04/06/21    Authorization Time Period Progress report period starting 11/26/2020    OT Start Time 1100    OT Stop Time 1145    OT Time Calculation (min) 45 min    Activity Tolerance Patient tolerated treatment well    Behavior During Therapy [facility] for tasks assessed/performed             Past Medical History:  Diagnosis Date   Acute blood loss anemia    Arthritis    Cancer ([place])    skin   Central cord syndrome at C6 level of cervical spinal cord ([place]) 11/29/2017   Hypertension    Protein-calorie malnutrition, severe ([place]) 01/24/2018   S/P insertion of IVC (inferior vena caval) filter 01/24/2018   Tetraparesis ([place])     Past Surgical History:  Procedure Laterality Date   ANTERIOR CERVICAL DECOMP/DISCECTOMY FUSION N/A 11/29/2017   Procedure: Cervical five-six, six-seven Anterior Cervical Decompression Fusion;  Surgeon: Judith Part, MD;  Location: [place];  Service: Neurosurgery;  Laterality: N/A;  Cervical five-six, six-seven Anterior Cervical Decompression Fusion   CATARACT EXTRACTION     IR IVC FILTER PLMT / S&I /IMG GUID/MOD SED  12/08/2017    There were no vitals filed for this visit.   Subjective Assessment - 04/01/21 1552     Subjective  Pt. reports doing well today, and is feeling better.    Patient is accompanied by: Family member    Pertinent History Patient s/p fall November 29, 2017 resulting in diagnosis of central cord syndrome at C 6 level.  She has had therapy in multiple venues but with recent move to [facility], therapy staff  recommended she seek outpatient therapy for her needs.    Patient Stated Goals Patient would like to be able to move better in bed, stand and perform self care tasks.    Currently in Pain? No/denies            OT TREATMENT     Pt. tolerated PROM bilateral wrist extension, digit MP, PIP, and DIP flexion, and extension, thumb radial, and palmar abduction in conjunction with moist heat. Emphasis was placed on left wrist extension, bilateral thumb radial, and palmar abduction, and bilateral digit MP, PIP, and DIP flexion. Pt. worked on [employer], and reciprocal motion using the UBE while sitting for 8 min. with minimal resistance.    Pt. Reports that she is feeling better today. Attempted a mini trial of paraffin bath after testing the bath with the left hand.  However, pt. was not able to tolerate it, and preferred to have moist heat pack instead.  Pt. continues to Present with bilateral wrist, and digit tightness, and continues to work towards improving with reaching  with her BUEs in preparation for improving overhead ADL tasks including: grooming/haircare, reaching, and writing. Pt. was able to tolerate the UBE without difficulty with increasing resistance. Pt. tolerated ROM well without reports of pain, or discomfort. Pt. continues to present with bilateral wrist flexor tightness, and digit MP, PIP, and DIP extensor tightness. Pt.  continues to benefit from working on impoving ROM in order to work towards increasing engagement of her bilateral hands during ADLs, and IADL tasks.                             OT Education - 04/01/21 1554     Education Details BUE functioning, and strengtheing    Person(s) Educated Patient;Caregiver(s)    Methods Explanation;Demonstration;Verbal cues    Comprehension Verbalized understanding;Need further instruction;Verbal cues required                 OT Long Term Goals - 02/16/21 1111       OT LONG TERM GOAL #1   Title  Patient and caregiver will demonstrate understanding of home exercise program for ROM.    Baseline Pt.'s current restorative aide is retiring at the end of December. Pt. continues to have a restorative aide rehab aide assist her wih exercises at [facility]. 8/8: Independently completes/directs HEP mulitple times/week. 12/17/2020: Pt. has a new restorative aide who will be resuming ROM program.    Time 12    Period Weeks    Status On-going    Target Date 04/06/21      OT LONG TERM GOAL #2   Title Patient will demonstrate ability to don shirt with min assist from seated position.      OT LONG TERM GOAL #4   Title Patient will demonstrate improved composite finger flexion to hold deodorant to apply to underarms.    Baseline 01/12/2021:  Pt. is progressing, and is able to reach to grasp, and hold narrow cones. 10/20/2020: Pt. conitnues to make steady progress with bilateral digit flexion.Pt. continues to present with stiifness/tightness which is hindering pt.'s ability to formulate a full composite fist. Pt. continues to present with improving finger flexion of R hand for a partial gross grasp, but unable to grasp and hold items. pt. conitnues to present with limited digit flexion of L hand. Pt. has active left thumb abduction. 8/8: holds cup with 2 hands, continues to have limited composite finger flexion L worse than R. 02/16/2021: pt. continues to present with limited bilateral gross composite fisting.    Time 12    Period Weeks    Status On-going    Target Date 04/06/21      OT LONG TERM GOAL #6   Title Patient will increase right UE ROM to comb the back of her hair with modified independence.    Baseline 01/12/2022: Pt. is able to comb her hair, reaching to the back of her hair.. 10/20/2020: Pt. continues to progress wtith RRUE ROM, and is able to use a pic for the back of her head, however is unable to brush the back of her hair. 07/30/2020: Pt. is now able to comb the back of her hair with a pic,  however continues to work on completing the task efficiently. 8/8: Achieves with great effort, has assistance for efficiancy. 02/16/2021: Pt. is now able to reach, and brush the back of her hair.    Time 12    Status Partially Met    Target Date 04/06/21      OT LONG TERM GOAL #7   Title Pt. will independently, and efficiently send text messages on her phone.    Baseline 01/12/2022:: pt. is now able to type text messages on her phone. 10/20/2020: Pt. is now able to type a text message on the phone, however not efficiently taking  increased time to complete. 07/30/2020: Pt. continues to present with limited BUE strength, and [facility] skills. 8/8: independtly drafts text messages, continues to work on accuracy and efficiancy 2/2 limited FMC.02/16/2021: pt. is now able to accessher phones,efficiently, and send text messages.    Time 12    Period Weeks    Status Achieved      OT LONG TERM GOAL #8   Title Pt. will write, and sign her name with 100% legibility, and modified independence.    Baseline 01/12/2021: Pt. continnues to fill out her daily menu and complete puzzles. 10/20/2020: Pt. continues to make progress overall. Pt. continues to consistently fill out her daily menu, and writing for crossword puzzles. Pt. is able to maintain grasp on a wide width pen. [place] to work on [employer]. 8/8: writes daily, continues to work on improving speed. 02/16/2021: Pt. continues to present wiht limited writing legibility, fluctuating daily.    Time 12    Period Weeks    Status Partially Met    Target Date 04/06/21                   Plan - 04/01/21 1555     Clinical Impression Statement Pt. Reports that she is feeling better today. Attempted a mini trial of paraffin bath after testing the bath with the left hand.  However, pt. was not able to tolerate it, and preferred to have moist heat pack instead.  Pt. continues to Present with bilateral wrist, and digit tightness, and continues to work  towards improving with reaching  with her BUEs in preparation for improving overhead ADL tasks including: grooming/haircare, reaching, and writing. Pt. was able to tolerate the UBE without difficulty with increasing resistance. Pt. tolerated ROM well without reports of pain, or discomfort. Pt. continues to present with bilateral wrist flexor tightness, and digit MP, PIP, and DIP extensor tightness. Pt. continues to benefit from working on impoving ROM in order to work towards increasing engagement of her bilateral hands during ADLs, and IADL tasks.        OT Occupational Profile and History Detailed Assessment- Review of Records and additional review of physical, cognitive, psychosocial history related to current functional performance    Occupational performance deficits (Please refer to evaluation for details): ADL's;IADL's;Leisure;Social Participation    Body Structure / Function / Physical Skills ADL;Continence;Dexterity;Flexibility;Strength;ROM;Balance;Coordination;FMC;IADL;Endurance;UE functional use;Decreased knowledge of use of DME;GMC    Psychosocial Skills Coping Strategies    Rehab Potential Fair    Clinical Decision Making Several treatment options, min-mod task modification necessary    Comorbidities Affecting Occupational Performance: Presence of comorbidities impacting occupational performance    Modification or Assistance to Complete Evaluation  No modification of tasks or assist necessary to complete eval    OT Frequency 2x / week    OT Duration 12 weeks    OT Treatment/Interventions Self-care/ADL training;Cryotherapy;Therapeutic exercise;DME and/or AE instruction;Balance training;Neuromuscular education;Manual Therapy;Splinting;Moist Heat;Passive range of motion;Therapeutic activities;Patient/family education    Plan continue to progress ROM of digits, wrists and shoulders as it pertains to completion of ADL tasks that pt values.    Consulted and Agree with Plan of Care Patient              Patient will benefit from skilled therapeutic intervention in order to improve the following deficits and impairments:   Body Structure / Function / Physical Skills: ADL, Continence, Dexterity, Flexibility, Strength, ROM, Balance, Coordination, FMC, IADL, Endurance, UE functional use, Decreased knowledge of use of DME, GMC  Psychosocial Skills: Coping Strategies   Visit Diagnosis: Muscle weakness (generalized)    Problem List Patient Active Problem List   Diagnosis Date Noted   Acute appendicitis with appendiceal abscess 05/30/2018   Hypocalcemia 02/15/2018   Dysuria 02/15/2018   Bilateral lower extremity edema 02/11/2018   Vaginal yeast infection 01/30/2018   Chest tightness 01/27/2018   Healthcare-associated pneumonia 01/25/2018   Pleural effusion, not elsewhere classified 01/25/2018   Acute deep vein thrombosis (DVT) of left lower extremity ([place]) 01/24/2018   Acute deep vein thrombosis (DVT) of right lower extremity ([place]) 01/24/2018   Chronic allergic rhinitis 01/24/2018   Depression with anxiety 01/24/2018   UTI due to Klebsiella species 01/24/2018   Protein-calorie malnutrition, severe ([place]) 01/24/2018   S/P insertion of IVC (inferior vena caval) filter 01/24/2018   Reactive depression    Hypokalemia    Leukocytosis    Essential hypertension    Trauma    Tetraparesis ([facility])    Neuropathic pain    Neurogenic bowel    Neurogenic bladder    Benign essential HTN    Acute blood loss anemia    Central cord syndrome at C6 level of cervical spinal cord ([place]) 11/29/2017   Central cord syndrome ([place]) 11/29/2017   Harrel Carina, MS, OTR/L   Harrel Carina, OT 04/01/2021, 3:56 PM  [place] [facility] SERVICES [address] [place], [place], [address] Phone: [phone]   Fax:  [phone]  Name: KAEDYNCE TAPP MRN: [id number] Date of Birth: 01/26/1935

## 2021-04-01 NOTE — Therapy (Signed)
[place] MAIN [facility] SERVICES [address] [place], [place], [address] Phone: [phone]   Fax:  [phone]  Physical Therapy Treatment/Physical Therapy Progress Note   Dates of reporting period  01/28/2021  to   04/01/2021  Patient Details  Name: Sherry [name] MRN: [id number] Date of Birth: 1934/05/18 No data recorded  Encounter Date: 04/01/2021   PT End of Session - 04/01/21 1413     Visit Number 130    Number of Visits 136    Date for PT Re-Evaluation 06/22/21    Authorization Type [employer]are FOTO performed by PT on eval (7/20), score 12, Progress note on 03/10/2020; PN on 08/20/2020    Authorization Time Period Recert 03/10/7822- 2/35/3614; PN on 05/05/1538; Recert 0/86/7619- 50/93/2671; PN on 2/45/8099; Recert 83/38/2505- 3/97/6734; Recert 1/93/7902-05/11/7351    PT Start Time 1145    PT Stop Time 1229    PT Time Calculation (min) 44 min    Equipment Utilized During Treatment Gait belt    Activity Tolerance Patient tolerated treatment well;Patient limited by fatigue    Behavior During Therapy [facility] for tasks assessed/performed             Past Medical History:  Diagnosis Date   Acute blood loss anemia    Arthritis    Cancer ([place])    skin   Central cord syndrome at C6 level of cervical spinal cord ([place]) 11/29/2017   Hypertension    Protein-calorie malnutrition, severe ([place]) 01/24/2018   S/P insertion of IVC (inferior vena caval) filter 01/24/2018   Tetraparesis ([place])     Past Surgical History:  Procedure Laterality Date   ANTERIOR CERVICAL DECOMP/DISCECTOMY FUSION N/A 11/29/2017   Procedure: Cervical five-six, six-seven Anterior Cervical Decompression Fusion;  Surgeon: Judith Part, MD;  Location: [facility];  Service: Neurosurgery;  Laterality: N/A;  Cervical five-six, six-seven Anterior Cervical Decompression Fusion   CATARACT EXTRACTION     IR IVC FILTER PLMT / S&I /IMG GUID/MOD SED  12/08/2017    There were no  vitals filed for this visit.   Subjective Assessment - 04/01/21 1502     Subjective Patient reports having a good day and agreeable to transfer to mat to work on LE strengthening and transfers.    Pertinent History Pt is an 87 yo female that fell in 2019, fractured vertebrae in her neck and in her low back per family. 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 After hospital stay, pt discharged to [facility] for ~1 month, experienced severe UTI as well as bilateral DVT (IVC filter placed). Discharged to [facility], stayed for about a year, and then moved to [facility] in April 2021. Was receiving PT, but per family facility reported that did not have adequate equipment to maximize PT for her. Pt until about 1 month ago was practicing sit to stand transfers with 1-2 people, and working on static standing. Has a brace for L foot due to PF. Pt currently needs assistance with all ADLs (able to assist with donning/doffing her shirt), bed baths, and needs a hoyer lift for transfers. Able to drive power wheelchair without assistance.    Currently in Pain? No/denies             INTERVENTIONS:   Therapeutic Activity:   Sit to stand from power chair with min A/Mod A +1 x 8 trials total Stand pivot transfer x 2 (Power chair to mat table then later in session back to chair) - Max A  as patient attempted to pivot her feet but unable today.   Static sitting at [facility] of mat while PT obtained some subjective information.  Patient performed several sit to stand from EOM attempting to side step or move LE's to position herself up closer to the head of bed.    Therex:   SAQ B LE 2 sets x 12 reps Gluteal sets - 3 sec hold x 12 reps Hip abd (AAROM) x 15 reps each LE Manual resistive Leg press x 15 reps each leg Bridging (with assist to hold legs in hooklye position) x 15 reps  PROM to B hips x 5 min.   Education provided throughout session via VC/TC and demonstration to facilitate movement at  target joints and correct muscle activation for all testing and exercises performed.                          PT Education - 04/02/21 1503     Education Details Transfer safety cues/exercise technique    Person(s) Educated Patient    Methods Explanation;Demonstration;Tactile cues;Verbal cues    Comprehension Verbalized understanding;Returned demonstration;Verbal cues required;Tactile cues required;Need further instruction              PT Short Term Goals - 03/10/20 2211       PT SHORT TERM GOAL #1   Title The patient will perform initial HEP with minimum assistance in order to improve strength and function.    Baseline Patient demonstrating independence with initial HEP as of 03/10/2020 with no questions or difficulty.    Time 4    Period Weeks    Status Achieved    Target Date 02/11/20               PT Long Term Goals - 03/30/21 1150       PT LONG TERM GOAL #1   Title The patient will be compliant with finalized HEP with minimum assistance in preparation for self management and maintenance of condition.    Baseline 03/10/2020- Patient familiar with initial HEP and will keep goal active as HEP becomes more progressive including possible transfers and standing activities. 04/07/2020- Patient continues to report compliance with LE strengthening home exercises without questions or concerns. 04/21/2020- Patient able to verbalize and demonstrate good understanding of current HEP including some supine and seated LE strengthening exercises. Reviewed and patient performed 10 reps today with minimal cueing. Will keep goal active to progress HEP as appropriate. 05/28/2020- patient reports continues to perform LE strengthening as able and some help from caregiver as able. No changes at this time to home program as patient is limited to supine/seated therex. 07/02/2020- Patient continues to report compliance with current supine and seated LE strengtheing home program and  states no questions or concerns regarding her plan.    Time 12    Period Weeks    Status Achieved      PT LONG TERM GOAL #2   Title The patient will demonstrate at least 10 point improvement on FOTO score indicating an improved ability to perform functional activities.    Baseline on eval (/720) score 12; 10/22/19: 12; 12/10/19: 18, 12/13: 18: 04/07/2020- Will assess next visit. 07/02/2020- Will obtain next visit. 09/24/2020= Will obtain next visit. 11/12/2020= 12; 12/22/2020= 12; 03/30/2021= 17    Time 12    Period Weeks    Status On-going    Target Date 06/22/21      PT LONG TERM GOAL #3  Title The patient will demonstrate lateral scooting  for assistance with transfers with minA to maximize independence.    Baseline 10/22/19: Pt requires modA+1 for lateral scooting; 12/10/19: modA+1, 12/13: modA-maxA, 12/27: modA-maxA, 03/10/2020- ModA-MaxA- increased verbal cues and visual demonstration. 04/07/2020- Continued Max assist to perform forward/lateral scoot. 04/21/2020- Patient was able to demo slight lateral scoot with CGA approx 12 in then fatigued requiring max assist- she is able to scoot forward by leaning without significant issues. 05/28/2020- While sitting in chair- patient able to perform forward and lateral scoot with VC/visual demo and increased time allowed- CGA today but not consistent yet- will keep goal active at this time to continue to focus on strengthening and improving technique. 07/02/2020- Patient able to demonstrate minimal ability to laterally shift weigh on mat table requiring max assist yet is able to forward scoot out to edge of mat table with min Assist. 08/20/2020 - Patient was able to demonstrate minimal lateral scooting at edge of mat while holding feet still so they did not slip on tile surface. She continues to be able to demonstrate scoot forward and uses tilt option to recline to position safely back into seat of power wheelchair. 09/25/2020- Patient able to sit on mat and minimally  scoot laterally but requires min/mod A for efficient scooting. Will end goal at this time due to plateau in progress with scooting over extended time.    Time 12    Period Weeks    Status Not Met      PT LONG TERM GOAL #4   Title The patient will demonstrate a squat pivot transfer with minimum assistance to maximize independence and mobility.    Baseline 10/22/19: unable to safely attempt at this time; 12/10/19: unable to safely attempt at this time, 01/14/20: unable to safety attempt at this time. 03/10/2020- Patient able to perform Stand pivot transfer with Maximal assist. 04/07/2020- Patient continues to require Max assist with sit to stand- will assist SPT next visit. 04/21/2020- Patient presents with Max assist to perform Sit to stand Transfer- unable to pivot or move Left LE well without difficulty. 07/02/2020= Max assist with SPT and minimal ability to pivot. 7/202/2022-Patient able to stand with minimal assist this visit (as good as CGA last visit) and able to move right foot to pivot and performing with moderate assist today. 09/24/2020- Patient able to perform sit to stand with Min assist from lift chair. Patient has demo inconsistency and will keep goal active.  12/17/2020- Goal is currently not appropriate as patient fractured her ankle and currently NWB. 12/22/2020=Goal is currently not appropriate as patient fractured her ankle and currently NWB.  03/30/2021=Patient was able to demo min assist to stand holding onto PT back of arms yet continues to require max assist to pivot as she was unable to pivot or turn her legs consistently today.    Time 12    Period Weeks    Status On-going    Target Date 06/22/21      PT LONG TERM GOAL #5   Title The patient will demonstrate sitting without UE support for 2-5 minutes at EOB to improve participation and maximize independence with ADLs.    Baseline 10/22/19: Pt able to sit unsupported for at least 2 minutes at edge of bed. 04/21/2020- Patient continues to  demo good sitting at edge of mat > 5 miin without difficulty or back support.    Time 12    Period Weeks    Status Achieved  PT LONG TERM GOAL #6   Title Patient will demonstrate improved functional LE strength as seen by consistent ability to stand > 2 min (3 out of 4 trials) with Mod/Max A for improved LE strength with transfers.    Baseline 05/28/2020- Patient able to inconsistent stand 1-2 min right now in //bars with Mod/Max A. 07/02/2020- Patient was able to progress last 2 visit to standing using bilateral platform attachment between 1-2 min which is a progression from the parallel bars. 08/20/2020- Patient performed static standing with min- mod A using bilateral platform attachment on walker for 6 min 30 sec today with assist for trunk control and intermittent verbal cues for posture. 09/24/2020- Patient has consistently been able to stand >2 min in // bars and when using bilateral Platform walker. 12/23/2020= Will reopen this goal as patient should transition back to weightbearing and again practice standing in new cert. 03/30/2021=1) 1 min 23 sec   2)  29mn 40 sec  3) 1 min 50 sec   Left LE hyperextended with increased difficulty tucking/extending hips today. Requiring more mod assist to remain standing.    Time 12    Period Weeks    Status Deferred    Target Date 06/22/21      PT LONG TERM GOAL #7   Title Patient/caregiver will demonstrate assisted transfer using [place] Lift consistently Independently for improved ability to transfer at home from lift chair to bed.    Baseline 09/24/2020- Patient requires hoyer lift (Dependent) for safe transfers.  12/17/2020- Patient currently still dependent on hoyer as she is currently non-weight bearing due to recent ankle fracture. 03/30/2021- Patient stated she has not tried. Agreeable to attempt in near future.    Time 12    Period Weeks    Status On-going    Target Date 06/22/21      PT LONG TERM GOAL #8   Title Patient will demo improved right  LE quad strength as seen by ability to extend knee to 20 deg from 0 or less while performing a Long arc quad (for improved terminal knee ext and more full ROM)  for improved ability to bearing weight with Right LE.    Baseline 12/22/2020=able to extend knee to approx 35 deg from zero during long arc quad; 2/27/203- WIll reassess next visit    Time 12    Period Weeks    Status New    Target Date 06/22/21                   Plan - 04/01/21 1413     Clinical Impression Statement Patient presents with good motivation today and able to perform several Sit to stand with min/mod A. She continues to be challenged with attempting to move her feet to pivot. Patient's goals were all just assessed for recert last visit so not directly assessed again today for progress note. She continues to require max assist to pivot. Maximum improvement is yet to be obtained. The anticipated improvement is attainable and reasonable in a generally predictable time. Pt will benefit from skilled physical therapy to increase mobility and strength to improve patient's quality of life    Personal Factors and Comorbidities Age;Time since onset of injury/illness/exacerbation;Comorbidity 3+;Fitness    Comorbidities HTN, quadriparesis, history of DVT, neurogenic bladder    Examination-Activity Limitations Bathing;Hygiene/Grooming;Squat;Bed Mobility;Lift;Bend;Stand;LEngineer, manufacturingToileting;Self Feeding;Transfers;Continence;Sit;Dressing;Sleep;Carry    Examination-Participation Restrictions Church;Laundry;Cleaning;Medication Management;Community Activity;Meal Prep;Interpersonal Relationship    Stability/Clinical Decision Making Evolving/Moderate complexity    Rehab Potential Fair  PT Frequency 2x / week    PT Duration 12 weeks    PT Treatment/Interventions ADLs/Self Care Home Management;Electrical Stimulation;Therapeutic activities;Wheelchair mobility training;Vasopneumatic Device;Joint  Manipulations;Vestibular;Passive range of motion;Patient/family education;Therapeutic exercise;DME Instruction;Biofeedback;Aquatic Therapy;Moist Heat;Gait training;Balance training;Orthotic Fit/Training;Dry needling;Energy conservation;Taping;Splinting;Neuromuscular re-education;Cryotherapy;Ultrasound;Functional mobility training    PT Next Visit Plan Continue with progressive standing/transfer training; Progress LE Strengthening    PT Home Exercise Plan no changes    Consulted and Agree with Plan of Care Patient             Patient will benefit from skilled therapeutic intervention in order to improve the following deficits and impairments:  Abnormal gait, Decreased balance, Decreased endurance, Decreased mobility, Difficulty walking, Hypomobility, Impaired sensation, Decreased range of motion, Improper body mechanics, Impaired perceived functional ability, Decreased activity tolerance, Decreased knowledge of use of DME, Decreased safety awareness, Decreased strength, Impaired flexibility, Impaired UE functional use, Postural dysfunction  Visit Diagnosis: Muscle weakness (generalized)  Difficulty in walking, not elsewhere classified  Unsteadiness on feet     Problem List Patient Active Problem List   Diagnosis Date Noted   Acute appendicitis with appendiceal abscess 05/30/2018   Hypocalcemia 02/15/2018   Dysuria 02/15/2018   Bilateral lower extremity edema 02/11/2018   Vaginal yeast infection 01/30/2018   Chest tightness 01/27/2018   Healthcare-associated pneumonia 01/25/2018   Pleural effusion, not elsewhere classified 01/25/2018   Acute deep vein thrombosis (DVT) of left lower extremity ([place]) 01/24/2018   Acute deep vein thrombosis (DVT) of right lower extremity ([place]) 01/24/2018   Chronic allergic rhinitis 01/24/2018   Depression with anxiety 01/24/2018   UTI due to Klebsiella species 01/24/2018   Protein-calorie malnutrition, severe ([place]) 01/24/2018   S/P insertion of IVC  (inferior vena caval) filter 01/24/2018   Reactive depression    Hypokalemia    Leukocytosis    Essential hypertension    Trauma    Tetraparesis ([facility])    Neuropathic pain    Neurogenic bowel    Neurogenic bladder    Benign essential HTN    Acute blood loss anemia    Central cord syndrome at C6 level of cervical spinal cord ([place]) 11/29/2017   Central cord syndrome ([place]) 11/29/2017    Lewis Moccasin, PT 04/02/2021, 3:03 PM  [employer] MAIN [facility] SERVICES [address] [place], [place], [address] Phone: [phone]   Fax:  [phone]  Name: Sherry [name] MRN: [id number] Date of Birth: July 15, 1934

## 2021-04-06 ENCOUNTER — Ambulatory Visit: Payer: Medicare HMO

## 2021-04-06 ENCOUNTER — Ambulatory Visit: Payer: Medicare HMO | Admitting: Occupational Therapy

## 2021-04-06 ENCOUNTER — Other Ambulatory Visit: Payer: Self-pay

## 2021-04-06 DIAGNOSIS — R278 Other lack of coordination: Secondary | ICD-10-CM

## 2021-04-06 DIAGNOSIS — R262 Difficulty in walking, not elsewhere classified: Secondary | ICD-10-CM

## 2021-04-06 DIAGNOSIS — M6281 Muscle weakness (generalized): Secondary | ICD-10-CM | POA: Diagnosis not present

## 2021-04-06 DIAGNOSIS — R2681 Unsteadiness on feet: Secondary | ICD-10-CM

## 2021-04-06 NOTE — Therapy (Signed)
[place] MAIN [facility] SERVICES [address] [place], [place], [address] Phone: [phone]   Fax:  [phone]  Physical Therapy Treatment  Patient Details  Name: Sherry [name] MRN: [id number] Date of Birth: Oct 28, 1934 No data recorded  Encounter Date: 04/06/2021   PT End of Session - 04/06/21 0815     Visit Number 131    Number of Visits 136    Date for PT Re-Evaluation 06/22/21    Authorization Type [employer] medicare FOTO performed by PT on eval (7/20), score 12, Progress note on 03/10/2020; PN on 08/20/2020    Authorization Time Period Recert 04/05/4560- 5/63/8937; PN on 04/05/2874; Recert 09/11/5724- 20/35/5974; PN on 1/63/8453; Recert 64/68/0321- 03/27/8248; Recert 0/37/0488-8/91/6945    PT Start Time 1145    PT Stop Time 1223    PT Time Calculation (min) 38 min    Equipment Utilized During Treatment Gait belt    Activity Tolerance Patient tolerated treatment well;Patient limited by fatigue    Behavior During Therapy [facility] for tasks assessed/performed             Past Medical History:  Diagnosis Date   Acute blood loss anemia    Arthritis    Cancer ([place])    skin   Central cord syndrome at C6 level of cervical spinal cord ([place]) 11/29/2017   Hypertension    Protein-calorie malnutrition, severe ([place]) 01/24/2018   S/P insertion of IVC (inferior vena caval) filter 01/24/2018   Tetraparesis ([place])     Past Surgical History:  Procedure Laterality Date   ANTERIOR CERVICAL DECOMP/DISCECTOMY FUSION N/A 11/29/2017   Procedure: Cervical five-six, six-seven Anterior Cervical Decompression Fusion;  Surgeon: Judith Part, MD;  Location: [facility];  Service: Neurosurgery;  Laterality: N/A;  Cervical five-six, six-seven Anterior Cervical Decompression Fusion   CATARACT EXTRACTION     IR IVC FILTER PLMT / S&I /IMG GUID/MOD SED  12/08/2017    There were no vitals filed for this visit.   Subjective Assessment - 04/06/21 1146     Subjective  Patient reports just okay today- Nothing new to speak of    Pertinent History Pt is an 87 yo female that fell in 2019, fractured vertebrae in her neck and in her low back per family. 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 After hospital stay, pt discharged to [facility] for ~1 month, experienced severe UTI as well as bilateral DVT (IVC filter placed). Discharged to [facility], stayed for about a year, and then moved to [facility] in April 2021. Was receiving PT, but per family facility reported that did not have adequate equipment to maximize PT for her. Pt until about 1 month ago was practicing sit to stand transfers with 1-2 people, and working on static standing. Has a brace for L foot due to PF. Pt currently needs assistance with all ADLs (able to assist with donning/doffing her shirt), bed baths, and needs a hoyer lift for transfers. Able to drive power wheelchair without assistance.    Currently in Pain? No/denies            INTERVENTIONS:    Therapeutic Activity:    Sit to stand from power chair with Mod A +1  Stand pivot transfer x 2 (Power chair to mat table then later in session back to chair) - Max A - Patient continues to be unable to shift her weight or pivot/move her LE's.    Manual therapy: PROM to low back and LE including: Lower trunk  rotation, single knee to chest, B Hamstring x 30 sec x 3 each leg    Therex:    SAQ B LE  x 15 reps Quad sets (5 sec hold) BLE x 15 reps Gluteal sets - 3 sec hold x 15 reps Hip abd (AAROM)  2 sets x 15 reps each LE Manual resistive Leg press x 15 reps each leg    Education provided throughout session via VC/TC and demonstration to facilitate movement at target joints and correct muscle activation for all testing and exercises performed.                            PT Education - 04/07/21 0814     Education Details Exercise cues/Transfer technique ed    Person(s) Educated Patient    Methods  Explanation;Demonstration;Tactile cues;Verbal cues    Comprehension Verbalized understanding;Returned demonstration;Verbal cues required;Tactile cues required;Need further instruction              PT Short Term Goals - 03/10/20 2211       PT SHORT TERM GOAL #1   Title The patient will perform initial HEP with minimum assistance in order to improve strength and function.    Baseline Patient demonstrating independence with initial HEP as of 03/10/2020 with no questions or difficulty.    Time 4    Period Weeks    Status Achieved    Target Date 02/11/20               PT Long Term Goals - 03/30/21 1150       PT LONG TERM GOAL #1   Title The patient will be compliant with finalized HEP with minimum assistance in preparation for self management and maintenance of condition.    Baseline 03/10/2020- Patient familiar with initial HEP and will keep goal active as HEP becomes more progressive including possible transfers and standing activities. 04/07/2020- Patient continues to report compliance with LE strengthening home exercises without questions or concerns. 04/21/2020- Patient able to verbalize and demonstrate good understanding of current HEP including some supine and seated LE strengthening exercises. Reviewed and patient performed 10 reps today with minimal cueing. Will keep goal active to progress HEP as appropriate. 05/28/2020- patient reports continues to perform LE strengthening as able and some help from caregiver as able. No changes at this time to home program as patient is limited to supine/seated therex. 07/02/2020- Patient continues to report compliance with current supine and seated LE strengtheing home program and states no questions or concerns regarding her plan.    Time 12    Period Weeks    Status Achieved      PT LONG TERM GOAL #2   Title The patient will demonstrate at least 10 point improvement on FOTO score indicating an improved ability to perform functional  activities.    Baseline on eval (/720) score 12; 10/22/19: 12; 12/10/19: 18, 12/13: 18: 04/07/2020- Will assess next visit. 07/02/2020- Will obtain next visit. 09/24/2020= Will obtain next visit. 11/12/2020= 12; 12/22/2020= 12; 03/30/2021= 17    Time 12    Period Weeks    Status On-going    Target Date 06/22/21      PT LONG TERM GOAL #3   Title The patient will demonstrate lateral scooting  for assistance with transfers with minA to maximize independence.    Baseline 10/22/19: Pt requires modA+1 for lateral scooting; 12/10/19: modA+1, 12/13: modA-maxA, 12/27: modA-maxA, 03/10/2020- ModA-MaxA- increased verbal cues and  visual demonstration. 04/07/2020- Continued Max assist to perform forward/lateral scoot. 04/21/2020- Patient was able to demo slight lateral scoot with CGA approx 12 in then fatigued requiring max assist- she is able to scoot forward by leaning without significant issues. 05/28/2020- While sitting in chair- patient able to perform forward and lateral scoot with VC/visual demo and increased time allowed- CGA today but not consistent yet- will keep goal active at this time to continue to focus on strengthening and improving technique. 07/02/2020- Patient able to demonstrate minimal ability to laterally shift weigh on mat table requiring max assist yet is able to forward scoot out to edge of mat table with min Assist. 08/20/2020 - Patient was able to demonstrate minimal lateral scooting at edge of mat while holding feet still so they did not slip on tile surface. She continues to be able to demonstrate scoot forward and uses tilt option to recline to position safely back into seat of power wheelchair. 09/25/2020- Patient able to sit on mat and minimally scoot laterally but requires min/mod A for efficient scooting. Will end goal at this time due to plateau in progress with scooting over extended time.    Time 12    Period Weeks    Status Not Met      PT LONG TERM GOAL #4   Title The patient will demonstrate  a squat pivot transfer with minimum assistance to maximize independence and mobility.    Baseline 10/22/19: unable to safely attempt at this time; 12/10/19: unable to safely attempt at this time, 01/14/20: unable to safety attempt at this time. 03/10/2020- Patient able to perform Stand pivot transfer with Maximal assist. 04/07/2020- Patient continues to require Max assist with sit to stand- will assist SPT next visit. 04/21/2020- Patient presents with Max assist to perform Sit to stand Transfer- unable to pivot or move Left LE well without difficulty. 07/02/2020= Max assist with SPT and minimal ability to pivot. 7/202/2022-Patient able to stand with minimal assist this visit (as good as CGA last visit) and able to move right foot to pivot and performing with moderate assist today. 09/24/2020- Patient able to perform sit to stand with Min assist from lift chair. Patient has demo inconsistency and will keep goal active.  12/17/2020- Goal is currently not appropriate as patient fractured her ankle and currently NWB. 12/22/2020=Goal is currently not appropriate as patient fractured her ankle and currently NWB.  03/30/2021=Patient was able to demo min assist to stand holding onto PT back of arms yet continues to require max assist to pivot as she was unable to pivot or turn her legs consistently today.    Time 12    Period Weeks    Status On-going    Target Date 06/22/21      PT LONG TERM GOAL #5   Title The patient will demonstrate sitting without UE support for 2-5 minutes at EOB to improve participation and maximize independence with ADLs.    Baseline 10/22/19: Pt able to sit unsupported for at least 2 minutes at edge of bed. 04/21/2020- Patient continues to demo good sitting at edge of mat > 5 miin without difficulty or back support.    Time 12    Period Weeks    Status Achieved      PT LONG TERM GOAL #6   Title Patient will demonstrate improved functional LE strength as seen by consistent ability to stand > 2  min (3 out of 4 trials) with Mod/Max A for improved LE strength with transfers.  Baseline 05/28/2020- Patient able to inconsistent stand 1-2 min right now in //bars with Mod/Max A. 07/02/2020- Patient was able to progress last 2 visit to standing using bilateral platform attachment between 1-2 min which is a progression from the parallel bars. 08/20/2020- Patient performed static standing with min- mod A using bilateral platform attachment on walker for 6 min 30 sec today with assist for trunk control and intermittent verbal cues for posture. 09/24/2020- Patient has consistently been able to stand >2 min in // bars and when using bilateral Platform walker. 12/23/2020= Will reopen this goal as patient should transition back to weightbearing and again practice standing in new cert. 03/30/2021=1) 1 min 23 sec   2)  67mn 40 sec  3) 1 min 50 sec   Left LE hyperextended with increased difficulty tucking/extending hips today. Requiring more mod assist to remain standing.    Time 12    Period Weeks    Status Deferred    Target Date 06/22/21      PT LONG TERM GOAL #7   Title Patient/caregiver will demonstrate assisted transfer using [place] Lift consistently Independently for improved ability to transfer at home from lift chair to bed.    Baseline 09/24/2020- Patient requires hoyer lift (Dependent) for safe transfers.  12/17/2020- Patient currently still dependent on hoyer as she is currently non-weight bearing due to recent ankle fracture. 03/30/2021- Patient stated she has not tried. Agreeable to attempt in near future.    Time 12    Period Weeks    Status On-going    Target Date 06/22/21      PT LONG TERM GOAL #8   Title Patient will demo improved right LE quad strength as seen by ability to extend knee to 20 deg from 0 or less while performing a Long arc quad (for improved terminal knee ext and more full ROM)  for improved ability to bearing weight with Right LE.    Baseline 12/22/2020=able to extend knee to  approx 35 deg from zero during long arc quad; 2/27/203- WIll reassess next visit    Time 12    Period Weeks    Status New    Target Date 06/22/21                   Plan - 04/06/21 0817     Clinical Impression Statement Patient presented with good motivation today and able to perform more reps with LE therex today. She was still unable to move her legs to effectively assist with pivot transfer and required more mod Assist to stand after working out. She remains in good spirits and tries hard to focus on her weaker right LE during therex to assist in overall strengthening for transfers. Pt will benefit from skilled physical therapy to increase mobility and strength to improve patient's quality of life    Personal Factors and Comorbidities Age;Time since onset of injury/illness/exacerbation;Comorbidity 3+;Fitness    Comorbidities HTN, quadriparesis, history of DVT, neurogenic bladder    Examination-Activity Limitations Bathing;Hygiene/Grooming;Squat;Bed Mobility;Lift;Bend;Stand;LEngineer, manufacturingToileting;Self Feeding;Transfers;Continence;Sit;Dressing;Sleep;Carry    Examination-Participation Restrictions Church;Laundry;Cleaning;Medication Management;Community Activity;Meal Prep;Interpersonal Relationship    Stability/Clinical Decision Making Evolving/Moderate complexity    Rehab Potential Fair    PT Frequency 2x / week    PT Duration 12 weeks    PT Treatment/Interventions ADLs/Self Care Home Management;Electrical Stimulation;Therapeutic activities;Wheelchair mobility training;Vasopneumatic Device;Joint Manipulations;Vestibular;Passive range of motion;Patient/family education;Therapeutic exercise;DME Instruction;Biofeedback;Aquatic Therapy;Moist Heat;Gait training;Balance training;Orthotic Fit/Training;Dry needling;Energy conservation;Taping;Splinting;Neuromuscular re-education;Cryotherapy;Ultrasound;Functional mobility training    PT Next Visit Plan Continue with progressive  standing/transfer training; Progress LE Strengthening    PT Home Exercise Plan no changes    Consulted and Agree with Plan of Care Patient             Patient will benefit from skilled therapeutic intervention in order to improve the following deficits and impairments:  Abnormal gait, Decreased balance, Decreased endurance, Decreased mobility, Difficulty walking, Hypomobility, Impaired sensation, Decreased range of motion, Improper body mechanics, Impaired perceived functional ability, Decreased activity tolerance, Decreased knowledge of use of DME, Decreased safety awareness, Decreased strength, Impaired flexibility, Impaired UE functional use, Postural dysfunction  Visit Diagnosis: Muscle weakness (generalized)  Difficulty in walking, not elsewhere classified  Unsteadiness on feet     Problem List Patient Active Problem List   Diagnosis Date Noted   Acute appendicitis with appendiceal abscess 05/30/2018   Hypocalcemia 02/15/2018   Dysuria 02/15/2018   Bilateral lower extremity edema 02/11/2018   Vaginal yeast infection 01/30/2018   Chest tightness 01/27/2018   Healthcare-associated pneumonia 01/25/2018   Pleural effusion, not elsewhere classified 01/25/2018   Acute deep vein thrombosis (DVT) of left lower extremity ([place]) 01/24/2018   Acute deep vein thrombosis (DVT) of right lower extremity ([place]) 01/24/2018   Chronic allergic rhinitis 01/24/2018   Depression with anxiety 01/24/2018   UTI due to Klebsiella species 01/24/2018   Protein-calorie malnutrition, severe ([place]) 01/24/2018   S/P insertion of IVC (inferior vena caval) filter 01/24/2018   Reactive depression    Hypokalemia    Leukocytosis    Essential hypertension    Trauma    Tetraparesis ([facility])    Neuropathic pain    Neurogenic bowel    Neurogenic bladder    Benign essential HTN    Acute blood loss anemia    Central cord syndrome at C6 level of cervical spinal cord ([place]) 11/29/2017   Central cord syndrome  ([place]) 11/29/2017    Lewis Moccasin, PT 04/07/2021, 8:24 AM  [place] MAIN [facility] SERVICES [place], [place], [address] Phone: [phone]   Fax:  [phone]  Name: EVEA SHEEK MRN: [id number] Date of Birth: 1934/07/30

## 2021-04-06 NOTE — Therapy (Signed)
[place] MAIN [facility] SERVICES [address] [place], [place], [address] Phone: [phone]   Fax:  [phone]  Occupational Therapy Treatment  Patient Details  Name: Sherry [name] MRN: [id number] Date of Birth: October 30, 1934 No data recorded  Encounter Date: 04/06/2021   OT End of Session - 04/06/21 1227     Visit Number 108    Number of Visits 130    Date for OT Re-Evaluation 04/06/21    Authorization Time Period Progress report period starting 11/26/2020    OT Start Time 1100    OT Stop Time 1145    OT Time Calculation (min) 45 min    Activity Tolerance Patient tolerated treatment well    Behavior During Therapy [place] for tasks assessed/performed             Past Medical History:  Diagnosis Date   Acute blood loss anemia    Arthritis    Cancer ([place])    skin   Central cord syndrome at C6 level of cervical spinal cord ([place]) 11/29/2017   Hypertension    Protein-calorie malnutrition, severe ([place]) 01/24/2018   S/P insertion of IVC (inferior vena caval) filter 01/24/2018   Tetraparesis ([place])     Past Surgical History:  Procedure Laterality Date   ANTERIOR CERVICAL DECOMP/DISCECTOMY FUSION N/A 11/29/2017   Procedure: Cervical five-six, six-seven Anterior Cervical Decompression Fusion;  Surgeon: Judith Part, MD;  Location: [place];  Service: Neurosurgery;  Laterality: N/A;  Cervical five-six, six-seven Anterior Cervical Decompression Fusion   CATARACT EXTRACTION     IR IVC FILTER PLMT / S&I /IMG GUID/MOD SED  12/08/2017    There were no vitals filed for this visit.   Subjective Assessment - 04/06/21 1227     Subjective  Pt. reports doing well today, and is feeling better.    Patient is accompanied by: Family member    Pertinent History Patient s/p fall November 29, 2017 resulting in diagnosis of central cord syndrome at C 6 level.  She has had therapy in multiple venues but with recent move to [facility], therapy staff  recommended she seek outpatient therapy for her needs.    Currently in Pain? No/denies            OT TREATMENT     Pt. tolerated PROM bilateral wrist extension, digit MP, PIP, and DIP flexion, and extension, thumb radial, and palmar abduction in conjunction with moist heat. Emphasis was placed on left wrist extension, bilateral thumb radial, and palmar abduction, and bilateral digit MP, PIP, and DIP flexion. Pt. worked on [employer], and reciprocal motion using the UBE while sitting for 8 min. with minimal resistance.    Pt. continues to Present with bilateral wrist, and digit tightness, and continues to work towards improving with reaching  with her BUEs in preparation for improving overhead ADL tasks including: grooming/haircare, reaching, and writing. Pt. was able to tolerate the UBE without difficulty with increasing resistance. Pt. tolerated ROM well without reports of pain, or discomfort following moist heat modality. Pt. continues to present with bilateral wrist flexor tightness, and digit MP, PIP, and DIP extensor tightness. Pt. continues to benefit from working on impoving ROM in order to work towards increasing engagement of her bilateral hands during ADLs, and IADL tasks.                          OT Education - 04/06/21 1227     Education Details  BUE functioning, and strengtheing    Person(s) Educated Patient;Caregiver(s)    Methods Explanation;Demonstration;Verbal cues    Comprehension Verbalized understanding;Need further instruction;Verbal cues required                 OT Long Term Goals - 02/16/21 1111       OT LONG TERM GOAL #1   Title Patient and caregiver will demonstrate understanding of home exercise program for ROM.    Baseline Pt.'s current restorative aide is retiring at the end of December. Pt. continues to have a restorative aide rehab aide assist her wih exercises at [facility]. 8/8: Independently completes/directs HEP mulitple  times/week. 12/17/2020: Pt. has a new restorative aide who will be resuming ROM program.    Time 12    Period Weeks    Status On-going    Target Date 04/06/21      OT LONG TERM GOAL #2   Title Patient will demonstrate ability to don shirt with min assist from seated position.      OT LONG TERM GOAL #4   Title Patient will demonstrate improved composite finger flexion to hold deodorant to apply to underarms.    Baseline 01/12/2021:  Pt. is progressing, and is able to reach to grasp, and hold narrow cones. 10/20/2020: Pt. conitnues to make steady progress with bilateral digit flexion.Pt. continues to present with stiifness/tightness which is hindering pt.'s ability to formulate a full composite fist. Pt. continues to present with improving finger flexion of R hand for a partial gross grasp, but unable to grasp and hold items. pt. conitnues to present with limited digit flexion of L hand. Pt. has active left thumb abduction. 8/8: holds cup with 2 hands, continues to have limited composite finger flexion L worse than R. 02/16/2021: pt. continues to present with limited bilateral gross composite fisting.    Time 12    Period Weeks    Status On-going    Target Date 04/06/21      OT LONG TERM GOAL #6   Title Patient will increase right UE ROM to comb the back of her hair with modified independence.    Baseline 01/12/2022: Pt. is able to comb her hair, reaching to the back of her hair.. 10/20/2020: Pt. continues to progress wtith RRUE ROM, and is able to use a pic for the back of her head, however is unable to brush the back of her hair. 07/30/2020: Pt. is now able to comb the back of her hair with a pic, however continues to work on completing the task efficiently. 8/8: Achieves with great effort, has assistance for efficiancy. 02/16/2021: Pt. is now able to reach, and brush the back of her hair.    Time 12    Status Partially Met    Target Date 04/06/21      OT LONG TERM GOAL #7   Title Pt. will  independently, and efficiently send text messages on her phone.    Baseline 01/12/2022:: pt. is now able to type text messages on her phone. 10/20/2020: Pt. is now able to type a text message on the phone, however not efficiently taking increased time to complete. 07/30/2020: Pt. continues to present with limited BUE strength, and [facility] skills. 8/8: independtly drafts text messages, continues to work on accuracy and efficiancy 2/2 limited FMC.02/16/2021: pt. is now able to accessher phones,efficiently, and send text messages.    Time 12    Period Weeks    Status Achieved      OT  LONG TERM GOAL #8   Title Pt. will write, and sign her name with 100% legibility, and modified independence.    Baseline 01/12/2021: Pt. continnues to fill out her daily menu and complete puzzles. 10/20/2020: Pt. continues to make progress overall. Pt. continues to consistently fill out her daily menu, and writing for crossword puzzles. Pt. is able to maintain grasp on a wide width pen. [place] to work on [employer]. 8/8: writes daily, continues to work on improving speed. 02/16/2021: Pt. continues to present wiht limited writing legibility, fluctuating daily.    Time 12    Period Weeks    Status Partially Met    Target Date 04/06/21                   Plan - 04/06/21 1227     Clinical Impression Statement Pt. continues to Present with bilateral wrist, and digit tightness, and continues to work towards improving with reaching  with her BUEs in preparation for improving overhead ADL tasks including: grooming/haircare, reaching, and writing. Pt. was able to tolerate the UBE without difficulty with increasing resistance. Pt. tolerated ROM well without reports of pain, or discomfort following moist heat modality. Pt. continues to present with bilateral wrist flexor tightness, and digit MP, PIP, and DIP extensor tightness. Pt. continues to benefit from working on impoving ROM in order to work towards increasing  engagement of her bilateral hands during ADLs, and IADL tasks.       OT Occupational Profile and History Detailed Assessment- Review of Records and additional review of physical, cognitive, psychosocial history related to current functional performance    Occupational performance deficits (Please refer to evaluation for details): ADL's;IADL's;Leisure;Social Participation    Body Structure / Function / Physical Skills ADL;Continence;Dexterity;Flexibility;Strength;ROM;Balance;Coordination;FMC;IADL;Endurance;UE functional use;Decreased knowledge of use of DME;GMC    Psychosocial Skills Coping Strategies    Rehab Potential Fair    Clinical Decision Making Several treatment options, min-mod task modification necessary    Comorbidities Affecting Occupational Performance: Presence of comorbidities impacting occupational performance    Modification or Assistance to Complete Evaluation  No modification of tasks or assist necessary to complete eval    OT Frequency 2x / week    OT Duration 12 weeks    OT Treatment/Interventions Self-care/ADL training;Cryotherapy;Therapeutic exercise;DME and/or AE instruction;Balance training;Neuromuscular education;Manual Therapy;Splinting;Moist Heat;Passive range of motion;Therapeutic activities;Patient/family education    Plan continue to progress ROM of digits, wrists and shoulders as it pertains to completion of ADL tasks that pt values.    Consulted and Agree with Plan of Care Patient             Patient will benefit from skilled therapeutic intervention in order to improve the following deficits and impairments:   Body Structure / Function / Physical Skills: ADL, Continence, Dexterity, Flexibility, Strength, ROM, Balance, Coordination, FMC, IADL, Endurance, UE functional use, Decreased knowledge of use of DME, GMC   Psychosocial Skills: Coping Strategies   Visit Diagnosis: Muscle weakness (generalized)  Other lack of coordination    Problem  List Patient Active Problem List   Diagnosis Date Noted   Acute appendicitis with appendiceal abscess 05/30/2018   Hypocalcemia 02/15/2018   Dysuria 02/15/2018   Bilateral lower extremity edema 02/11/2018   Vaginal yeast infection 01/30/2018   Chest tightness 01/27/2018   Healthcare-associated pneumonia 01/25/2018   Pleural effusion, not elsewhere classified 01/25/2018   Acute deep vein thrombosis (DVT) of left lower extremity ([place]) 01/24/2018   Acute deep vein thrombosis (DVT) of right lower extremity (  [place]) 01/24/2018   Chronic allergic rhinitis 01/24/2018   Depression with anxiety 01/24/2018   UTI due to Klebsiella species 01/24/2018   Protein-calorie malnutrition, severe ([place]) 01/24/2018   S/P insertion of IVC (inferior vena caval) filter 01/24/2018   Reactive depression    Hypokalemia    Leukocytosis    Essential hypertension    Trauma    Tetraparesis ([facility])    Neuropathic pain    Neurogenic bowel    Neurogenic bladder    Benign essential HTN    Acute blood loss anemia    Central cord syndrome at C6 level of cervical spinal cord ([place]) 11/29/2017   Central cord syndrome [facility]) 11/29/2017   Harrel Carina, MS, OTR/L   Harrel Carina, OT 04/06/2021, 12:28 PM  [place] MAIN [facility] SERVICES [address] [place], [place], [address] Phone: [phone]   Fax:  [phone]  Name: Sherry [name] MRN: [id number] Date of Birth: 1934-09-17

## 2021-04-08 ENCOUNTER — Other Ambulatory Visit: Payer: Self-pay

## 2021-04-08 ENCOUNTER — Ambulatory Visit: Payer: Medicare HMO | Admitting: Occupational Therapy

## 2021-04-08 ENCOUNTER — Encounter: Payer: Self-pay | Admitting: Occupational Therapy

## 2021-04-08 ENCOUNTER — Ambulatory Visit: Payer: Medicare HMO

## 2021-04-08 DIAGNOSIS — R262 Difficulty in walking, not elsewhere classified: Secondary | ICD-10-CM

## 2021-04-08 DIAGNOSIS — M6281 Muscle weakness (generalized): Secondary | ICD-10-CM

## 2021-04-08 DIAGNOSIS — R2681 Unsteadiness on feet: Secondary | ICD-10-CM

## 2021-04-08 NOTE — Therapy (Signed)
[place] MAIN [facility] SERVICES [address] [place], [place], [address] Phone: [phone]   Fax:  [phone]  Physical Therapy Treatment  Patient Details  Name: Sherry [name] MRN: [id number] Date of Birth: 1934/07/01 No data recorded  Encounter Date: 04/08/2021   PT End of Session - 04/08/21 1106     Visit Number 132    Number of Visits 136    Date for PT Re-Evaluation 06/22/21    Authorization Type a[employer]na medicare FOTO performed by PT on eval (7/20), score 12, Progress note on 03/10/2020; PN on 08/20/2020    Authorization Time Period Recert 10/04/8099- 7/51/0258; PN on 06/02/7780; Recert 05/25/5359- 44/31/5400; PN on 8/67/6195; Recert 09/32/6712- 4/58/0998; Recert 3/38/2505-3/97/6734    PT Start Time 1145    PT Stop Time 1225    PT Time Calculation (min) 40 min    Equipment Utilized During Treatment Gait belt    Activity Tolerance Patient tolerated treatment well;Patient limited by fatigue    Behavior During Therapy [facility] for tasks assessed/performed             Past Medical History:  Diagnosis Date   Acute blood loss anemia    Arthritis    Cancer ([place])    skin   Central cord syndrome at C6 level of cervical spinal cord ([place]) 11/29/2017   Hypertension    Protein-calorie malnutrition, severe ([place]) 01/24/2018   S/P insertion of IVC (inferior vena caval) filter 01/24/2018   Tetraparesis ([place])     Past Surgical History:  Procedure Laterality Date   ANTERIOR CERVICAL DECOMP/DISCECTOMY FUSION N/A 11/29/2017   Procedure: Cervical five-six, six-seven Anterior Cervical Decompression Fusion;  Surgeon: Judith Part, MD;  Location: [place];  Service: Neurosurgery;  Laterality: N/A;  Cervical five-six, six-seven Anterior Cervical Decompression Fusion   CATARACT EXTRACTION     IR IVC FILTER PLMT / S&I /IMG GUID/MOD SED  12/08/2017    There were no vitals filed for this visit.   Subjective Assessment - 04/08/21 1106     Subjective  Patient reports doing well today and states no soreness noted from last visit.    Pertinent History Pt is an 87 yo female that fell in 2019, fractured vertebrae in her neck and in her low back per family. 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 After hospital stay, pt discharged to [facility] for ~1 month, experienced severe UTI as well as bilateral DVT (IVC filter placed). Discharged to [facility], stayed for about a year, and then moved to [facility] in April 2021. Was receiving PT, but per family facility reported that did not have adequate equipment to maximize PT for her. Pt until about 1 month ago was practicing sit to stand transfers with 1-2 people, and working on static standing. Has a brace for L foot due to PF. Pt currently needs assistance with all ADLs (able to assist with donning/doffing her shirt), bed baths, and needs a hoyer lift for transfers. Able to drive power wheelchair without assistance.    Currently in Pain? No/denies             INTERVENTIONS:         Therex:   Supine:  SAQ B LE  x 15 reps Quad sets (5 sec hold) BLE x 15 reps Gluteal sets - 3 sec hold x 15 reps Bridging (mod A to secure knees/LE in hooklye)  x 15 reps Hip abd (AAROM)  2 sets x 15 reps each LE Manual resistive  Leg press x 15 reps each leg Abdominal Crunch (mod A to position/hold LE in hooklye) x 15 reps. Seated:  Long arc quad x 15 reps BLE Cross body punch x 15 reps BUE       Education provided throughout session via VC/TC and demonstration to facilitate movement at target joints and correct muscle activation for all testing and exercises performed.    Therapeutic Activity:   Rolling to her left from supine- Assist (min) with upper trunk/arms yet dependent with LE's Sit to stand from power chair with Mod A +1 (VC to scoot out to edge of chair and later edge of mat)  Stand pivot transfer x 2 (Power chair to mat table then later in session back to chair) - Max A - Patient  continues to be unable to shift her weight or pivot/move her LE's.                          PT Education - 04/08/21 1415     Education Details Exercise technique    Person(s) Educated Patient    Methods Explanation;Demonstration;Tactile cues;Verbal cues    Comprehension Verbalized understanding;Returned demonstration;Verbal cues required;Tactile cues required;Need further instruction              PT Short Term Goals - 03/10/20 2211       PT SHORT TERM GOAL #1   Title The patient will perform initial HEP with minimum assistance in order to improve strength and function.    Baseline Patient demonstrating independence with initial HEP as of 03/10/2020 with no questions or difficulty.    Time 4    Period Weeks    Status Achieved    Target Date 02/11/20               PT Long Term Goals - 03/30/21 1150       PT LONG TERM GOAL #1   Title The patient will be compliant with finalized HEP with minimum assistance in preparation for self management and maintenance of condition.    Baseline 03/10/2020- Patient familiar with initial HEP and will keep goal active as HEP becomes more progressive including possible transfers and standing activities. 04/07/2020- Patient continues to report compliance with LE strengthening home exercises without questions or concerns. 04/21/2020- Patient able to verbalize and demonstrate good understanding of current HEP including some supine and seated LE strengthening exercises. Reviewed and patient performed 10 reps today with minimal cueing. Will keep goal active to progress HEP as appropriate. 05/28/2020- patient reports continues to perform LE strengthening as able and some help from caregiver as able. No changes at this time to home program as patient is limited to supine/seated therex. 07/02/2020- Patient continues to report compliance with current supine and seated LE strengtheing home program and states no questions or concerns regarding  her plan.    Time 12    Period Weeks    Status Achieved      PT LONG TERM GOAL #2   Title The patient will demonstrate at least 10 point improvement on FOTO score indicating an improved ability to perform functional activities.    Baseline on eval (/720) score 12; 10/22/19: 12; 12/10/19: 18, 12/13: 18: 04/07/2020- Will assess next visit. 07/02/2020- Will obtain next visit. 09/24/2020= Will obtain next visit. 11/12/2020= 12; 12/22/2020= 12; 03/30/2021= 17    Time 12    Period Weeks    Status On-going    Target Date 06/22/21  PT LONG TERM GOAL #3   Title The patient will demonstrate lateral scooting  for assistance with transfers with minA to maximize independence.    Baseline 10/22/19: Pt requires modA+1 for lateral scooting; 12/10/19: modA+1, 12/13: modA-maxA, 12/27: modA-maxA, 03/10/2020- ModA-MaxA- increased verbal cues and visual demonstration. 04/07/2020- Continued Max assist to perform forward/lateral scoot. 04/21/2020- Patient was able to demo slight lateral scoot with CGA approx 12 in then fatigued requiring max assist- she is able to scoot forward by leaning without significant issues. 05/28/2020- While sitting in chair- patient able to perform forward and lateral scoot with VC/visual demo and increased time allowed- CGA today but not consistent yet- will keep goal active at this time to continue to focus on strengthening and improving technique. 07/02/2020- Patient able to demonstrate minimal ability to laterally shift weigh on mat table requiring max assist yet is able to forward scoot out to edge of mat table with min Assist. 08/20/2020 - Patient was able to demonstrate minimal lateral scooting at edge of mat while holding feet still so they did not slip on tile surface. She continues to be able to demonstrate scoot forward and uses tilt option to recline to position safely back into seat of power wheelchair. 09/25/2020- Patient able to sit on mat and minimally scoot laterally but requires min/mod A for  efficient scooting. Will end goal at this time due to plateau in progress with scooting over extended time.    Time 12    Period Weeks    Status Not Met      PT LONG TERM GOAL #4   Title The patient will demonstrate a squat pivot transfer with minimum assistance to maximize independence and mobility.    Baseline 10/22/19: unable to safely attempt at this time; 12/10/19: unable to safely attempt at this time, 01/14/20: unable to safety attempt at this time. 03/10/2020- Patient able to perform Stand pivot transfer with Maximal assist. 04/07/2020- Patient continues to require Max assist with sit to stand- will assist SPT next visit. 04/21/2020- Patient presents with Max assist to perform Sit to stand Transfer- unable to pivot or move Left LE well without difficulty. 07/02/2020= Max assist with SPT and minimal ability to pivot. 7/202/2022-Patient able to stand with minimal assist this visit (as good as CGA last visit) and able to move right foot to pivot and performing with moderate assist today. 09/24/2020- Patient able to perform sit to stand with Min assist from lift chair. Patient has demo inconsistency and will keep goal active.  12/17/2020- Goal is currently not appropriate as patient fractured her ankle and currently NWB. 12/22/2020=Goal is currently not appropriate as patient fractured her ankle and currently NWB.  03/30/2021=Patient was able to demo min assist to stand holding onto PT back of arms yet continues to require max assist to pivot as she was unable to pivot or turn her legs consistently today.    Time 12    Period Weeks    Status On-going    Target Date 06/22/21      PT LONG TERM GOAL #5   Title The patient will demonstrate sitting without UE support for 2-5 minutes at EOB to improve participation and maximize independence with ADLs.    Baseline 10/22/19: Pt able to sit unsupported for at least 2 minutes at edge of bed. 04/21/2020- Patient continues to demo good sitting at edge of mat > 5 miin  without difficulty or back support.    Time 12    Period Weeks  Status Achieved      PT LONG TERM GOAL #6   Title Patient will demonstrate improved functional LE strength as seen by consistent ability to stand > 2 min (3 out of 4 trials) with Mod/Max A for improved LE strength with transfers.    Baseline 05/28/2020- Patient able to inconsistent stand 1-2 min right now in //bars with Mod/Max A. 07/02/2020- Patient was able to progress last 2 visit to standing using bilateral platform attachment between 1-2 min which is a progression from the parallel bars. 08/20/2020- Patient performed static standing with min- mod A using bilateral platform attachment on walker for 6 min 30 sec today with assist for trunk control and intermittent verbal cues for posture. 09/24/2020- Patient has consistently been able to stand >2 min in // bars and when using bilateral Platform walker. 12/23/2020= Will reopen this goal as patient should transition back to weightbearing and again practice standing in new cert. 03/30/2021=1) 1 min 23 sec   2)  2mn 40 sec  3) 1 min 50 sec   Left LE hyperextended with increased difficulty tucking/extending hips today. Requiring more mod assist to remain standing.    Time 12    Period Weeks    Status Deferred    Target Date 06/22/21      PT LONG TERM GOAL #7   Title Patient/caregiver will demonstrate assisted transfer using [place] Lift consistently Independently for improved ability to transfer at home from lift chair to bed.    Baseline 09/24/2020- Patient requires hoyer lift (Dependent) for safe transfers.  12/17/2020- Patient currently still dependent on hoyer as she is currently non-weight bearing due to recent ankle fracture. 03/30/2021- Patient stated she has not tried. Agreeable to attempt in near future.    Time 12    Period Weeks    Status On-going    Target Date 06/22/21      PT LONG TERM GOAL #8   Title Patient will demo improved right LE quad strength as seen by ability to  extend knee to 20 deg from 0 or less while performing a Long arc quad (for improved terminal knee ext and more full ROM)  for improved ability to bearing weight with Right LE.    Baseline 12/22/2020=able to extend knee to approx 35 deg from zero during long arc quad; 2/27/203- WIll reassess next visit    Time 12    Period Weeks    Status New    Target Date 06/22/21                   Plan - 04/08/21 1107     Clinical Impression Statement Patient arrived with good motivation today and able to progress some with LE strengthening in supine and seated position. Patient continues to stand well with min-mod assist yet unable to pivot or functionally move either leg- requiring max assist to pivot transfer. She was unable to clear her bottom with bridging today yet is able to successfully contract her glutes with more activity. Pt will benefit from skilled physical therapy to increase mobility and strength to improve patient's quality of life    Personal Factors and Comorbidities Age;Time since onset of injury/illness/exacerbation;Comorbidity 3+;Fitness    Comorbidities HTN, quadriparesis, history of DVT, neurogenic bladder    Examination-Activity Limitations Bathing;Hygiene/Grooming;Squat;Bed Mobility;Lift;Bend;Stand;LEngineer, manufacturingToileting;Self Feeding;Transfers;Continence;Sit;Dressing;Sleep;Carry    Examination-Participation Restrictions Church;Laundry;Cleaning;Medication Management;Community Activity;Meal Prep;Interpersonal Relationship    Stability/Clinical Decision Making Evolving/Moderate complexity    Rehab Potential Fair    PT Frequency 2x / week  PT Duration 12 weeks    PT Treatment/Interventions ADLs/Self Care Home Management;Electrical Stimulation;Therapeutic activities;Wheelchair mobility training;Vasopneumatic Device;Joint Manipulations;Vestibular;Passive range of motion;Patient/family education;Therapeutic exercise;DME Instruction;Biofeedback;Aquatic  Therapy;Moist Heat;Gait training;Balance training;Orthotic Fit/Training;Dry needling;Energy conservation;Taping;Splinting;Neuromuscular re-education;Cryotherapy;Ultrasound;Functional mobility training    PT Next Visit Plan Continue with progressive standing/transfer training; Progress LE Strengthening    PT Home Exercise Plan no changes    Consulted and Agree with Plan of Care Patient             Patient will benefit from skilled therapeutic intervention in order to improve the following deficits and impairments:  Abnormal gait, Decreased balance, Decreased endurance, Decreased mobility, Difficulty walking, Hypomobility, Impaired sensation, Decreased range of motion, Improper body mechanics, Impaired perceived functional ability, Decreased activity tolerance, Decreased knowledge of use of DME, Decreased safety awareness, Decreased strength, Impaired flexibility, Impaired UE functional use, Postural dysfunction  Visit Diagnosis: Muscle weakness (generalized)  Difficulty in walking, not elsewhere classified  Unsteadiness on feet     Problem List Patient Active Problem List   Diagnosis Date Noted   Acute appendicitis with appendiceal abscess 05/30/2018   Hypocalcemia 02/15/2018   Dysuria 02/15/2018   Bilateral lower extremity edema 02/11/2018   Vaginal yeast infection 01/30/2018   Chest tightness 01/27/2018   Healthcare-associated pneumonia 01/25/2018   Pleural effusion, not elsewhere classified 01/25/2018   Acute deep vein thrombosis (DVT) of left lower extremity ([place]) 01/24/2018   Acute deep vein thrombosis (DVT) of right lower extremity ([place]) 01/24/2018   Chronic allergic rhinitis 01/24/2018   Depression with anxiety 01/24/2018   UTI due to Klebsiella species 01/24/2018   Protein-calorie malnutrition, severe ([place]) 01/24/2018   S/P insertion of IVC (inferior vena caval) filter 01/24/2018   Reactive depression    Hypokalemia    Leukocytosis    Essential hypertension     Trauma    Tetraparesis ([facility])    Neuropathic pain    Neurogenic bowel    Neurogenic bladder    Benign essential HTN    Acute blood loss anemia    Central cord syndrome at C6 level of cervical spinal cord ([place]) 11/29/2017   Central cord syndrome ([place]) 11/29/2017    Lewis Moccasin, PT 04/08/2021, 2:16 PM  [place] MAIN [facility] SERVICES [address] [place], [place], [address] Phone: [phone]   Fax:  [phone]  Name: Sherry [name] MRN: [id number] Date of Birth: [birth year]

## 2021-04-08 NOTE — Therapy (Signed)
[facility] SERVICES [address] [place], [place], [address] Phone: [phone]   Fax:  [phone]  Occupational Therapy Treatment/Recertification  Patient Details  Name: NELIAH CUYLER MRN: [id number] Date of Birth: 1934/03/04 No data recorded  Encounter Date: 04/08/2021   OT End of Session - 04/08/21 1219     Visit Number 109    Number of Visits 130    Date for OT Re-Evaluation 07/01/21    Authorization Time Period Progress report period starting 11/26/2020    OT Start Time 71    OT Stop Time 1145    OT Time Calculation (min) 45 min    Equipment Utilized During Treatment moist heat    Activity Tolerance Patient tolerated treatment well    Behavior During Therapy [facility] for tasks assessed/performed             Past Medical History:  Diagnosis Date   Acute blood loss anemia    Arthritis    Cancer ([place])    skin   Central cord syndrome at C6 level of cervical spinal cord ([place]) 11/29/2017   Hypertension    Protein-calorie malnutrition, severe ([place]) 01/24/2018   S/P insertion of IVC (inferior vena caval) filter 01/24/2018   Tetraparesis ([place])     Past Surgical History:  Procedure Laterality Date   ANTERIOR CERVICAL DECOMP/DISCECTOMY FUSION N/A 11/29/2017   Procedure: Cervical five-six, six-seven Anterior Cervical Decompression Fusion;  Surgeon: Judith Part, MD;  Location: [place];  Service: Neurosurgery;  Laterality: N/A;  Cervical five-six, six-seven Anterior Cervical Decompression Fusion   CATARACT EXTRACTION     IR IVC FILTER PLMT / S&I /IMG GUID/MOD SED  12/08/2017    There were no vitals filed for this visit.   Subjective Assessment - 04/08/21 1218     Subjective  Pt. reports doing well today, and is feeling better.    Patient is accompanied by: Family member    Pertinent History Patient s/p fall November 29, 2017 resulting in diagnosis of central cord syndrome at C 6 level.  She has had therapy in  multiple venues but with recent move to [facility], therapy staff recommended she seek outpatient therapy for her needs.    Currently in Pain? No/denies             OT TREATMENT     Pt. tolerated PROM bilateral wrist extension, digit MP, PIP, and DIP flexion, and extension, thumb radial, and palmar abduction in conjunction with moist heat. Emphasis was placed on left wrist extension, bilateral thumb radial, and palmar abduction, and bilateral digit MP, PIP, and DIP flexion. Pt. worked on [employer], and reciprocal motion using the UBE while sitting for 8 min. with minimal resistance.    Pt. has had multiple cancellations during this past recertification period secondary to having COVID-19, being quarantined, and not feeling which limited progress. Pt. presents with weakness overall. Pt. continues to present with bilateral wrist, and digit tightness, and pt. continues to work towards improving with reaching with her BUEs in preparation for improving overhead ADL tasks including: grooming/haircare, reaching, and writing. Pt. was able to tolerate the UBE without difficulty with increasing resistance. Pt. tolerated ROM well without reports of pain, or discomfort following moist heat modality. Pt. continues to present with bilateral wrist flexor tightness, and digit MP, PIP, and DIP extensor tightness. Pt. continues to benefit from working on impoving ROM in order to work towards increasing engagement of her bilateral hands during ADLs, and IADL  tasks. Plan for 10th visit progress report, and FOTO assessment next visit.                                 OT Education - 04/08/21 1219     Education Details BUE functioning, and strengtheing    Person(s) Educated Patient;Caregiver(s)    Methods Explanation;Demonstration;Verbal cues    Comprehension Verbalized understanding;Need further instruction;Verbal cues required                 OT Long Term Goals - 04/08/21 1221        OT LONG TERM GOAL #1   Title Patient and caregiver will demonstrate understanding of home exercise program for ROM.    Baseline Pt. has recently been in quarantine for COVID-19. Pt.'s current restorative aide is retiring at the end of December. Pt. continues to have a restorative aide rehab aide assist her wih exercises at [facility]. 8/8: Independently completes/directs HEP mulitple times/week. 12/17/2020: Pt. has a new restorative aide who will be resuming ROM program.    Time 12    Period Weeks    Status On-going    Target Date 07/01/21      OT LONG TERM GOAL #4   Baseline 01/12/2021:  Pt. is progressing, and is able to reach to grasp, and hold narrow cones. 10/20/2020: Pt. conitnues to make steady progress with bilateral digit flexion.Pt. continues to present with stiifness/tightness which is hindering pt.'s ability to formulate a full composite fist. Pt. continues to present with improving finger flexion of R hand for a partial gross grasp, but unable to grasp and hold items. pt. conitnues to present with limited digit flexion of L hand. Pt. has active left thumb abduction. 8/8: holds cup with 2 hands, continues to have limited composite finger flexion L worse than R. 02/16/2021: pt. continues to present with limited bilateral gross composite fisting. 04/08/2021: pt. continues to present with limited digit flexion affecting her ability to formulate bilateral composite fisting.    Time 12    Period Weeks    Status On-going    Target Date 07/01/21      OT LONG TERM GOAL #5   Title Pt will complete self feeding with setup, use of universal cuff and minimal spillage.      OT LONG TERM GOAL #6   Title Patient will increase right UE ROM to comb the back of her hair with modified independence.    Baseline 01/12/2022: Pt. is able to comb her hair, reaching to the back of her hair.. 10/20/2020: Pt. continues to progress wtith RRUE ROM, and is able to use a pic for the back of her head, however is  unable to brush the back of her hair. 07/30/2020: Pt. is now able to comb the back of her hair with a pic, however continues to work on completing the task efficiently. 8/8: Achieves with great effort, has assistance for efficiancy. 02/16/2021: Pt. is now able to reach, and brush the back of her hair. 04/08/2021: pt. continues to improve reach towards the back of her hair.    Time 12    Period Weeks    Status Partially Met    Target Date 07/01/21      OT LONG TERM GOAL #8   Title Pt. will write, and sign her name with 100% legibility, and modified independence.    Baseline 01/12/2021: Pt. continnues to fill out her daily menu and  complete puzzles. 10/20/2020: Pt. continues to make progress overall. Pt. continues to consistently fill out her daily menu, and writing for crossword puzzles. Pt. is able to maintain grasp on a wide width pen. Contineus to work on Media pl[employer]. 8/8: writes daily, continues to work on improving speed. 02/16/2021: Pt. continues to present wiht limited writing legibility, fluctuating daily. 04/08/2021: Pt. continues to to be able to fill out her daily menu.limited writing legibility for daily puzzles.    Time 12    Period Weeks    Status Partially Met    Target Date 07/01/21                   Plan - 04/08/21 1220     Clinical Impression Statement Pt. has had multiple cancellations during this past recertification period secondary to having COVID-19, being quarantined, and not feeling which limited progress. Pt. presents with weakness overall. Pt. continues to present with bilateral wrist, and digit tightness, and pt. continues to work towards improving with reaching with her BUEs in preparation for improving overhead ADL tasks including: grooming/haircare, reaching, and writing. Pt. was able to tolerate the UBE without difficulty with increasing resistance. Pt. tolerated ROM well without reports of pain, or discomfort following moist heat modality. Pt. continues to  present with bilateral wrist flexor tightness, and digit MP, PIP, and DIP extensor tightness. Pt. continues to benefit from working on impoving ROM in order to work towards increasing engagement of her bilateral hands during ADLs, and IADL tasks. Plan for 10th visit progress report, and FOTO assessment next visit.        OT Occupational Profile and History Detailed Assessment- Review of Records and additional review of physical, cognitive, psychosocial history related to current functional performance    Occupational performance deficits (Please refer to evaluation for details): ADL's;IADL's;Leisure;Social Participation    Body Structure / Function / Physical Skills ADL;Continence;Dexterity;Flexibility;Strength;ROM;Balance;Coordination;FMC;IADL;Endurance;UE functional use;Decreased knowledge of use of DME;GMC    Psychosocial Skills Coping Strategies    Rehab Potential Fair    Clinical Decision Making Several treatment options, min-mod task modification necessary    Comorbidities Affecting Occupational Performance: Presence of comorbidities impacting occupational performance    Modification or Assistance to Complete Evaluation  No modification of tasks or assist necessary to complete eval    OT Frequency 2x / week    OT Duration 12 weeks    OT Treatment/Interventions Self-care/ADL training;Cryotherapy;Therapeutic exercise;DME and/or AE instruction;Balance training;Neuromuscular education;Manual Therapy;Splinting;Moist Heat;Passive range of motion;Therapeutic activities;Patient/family education    Plan continue to progress ROM of digits, wrists and shoulders as it pertains to completion of ADL tasks that pt values.    Consulted and Agree with Plan of Care Patient             Patient will benefit from skilled therapeutic intervention in order to improve the following deficits and impairments:   Body Structure / Function / Physical Skills: ADL, Continence, Dexterity, Flexibility, Strength, ROM,  Balance, Coordination, FMC, IADL, Endurance, UE functional use, Decreased knowledge of use of DME, GMC   Psychosocial Skills: Coping Strategies   Visit Diagnosis: Muscle weakness (generalized)    Problem List Patient Active Problem List   Diagnosis Date Noted   Acute appendicitis with appendiceal abscess 05/30/2018   Hypocalcemia 02/15/2018   Dysuria 02/15/2018   Bilateral lower extremity edema 02/11/2018   Vaginal yeast infection 01/30/2018   Chest tightness 01/27/2018   Healthcare-associated pneumonia 01/25/2018   Pleural effusion, not elsewhere classified 01/25/2018   Acute deep vein thrombosis (  DVT) of left lower extremity ([place]) 01/24/2018   Acute deep vein thrombosis (DVT) of right lower extremity ([place]) 01/24/2018   Chronic allergic rhinitis 01/24/2018   Depression with anxiety 01/24/2018   UTI due to Klebsiella species 01/24/2018   Protein-calorie malnutrition, severe ([place]) 01/24/2018   S/P insertion of IVC (inferior vena caval) filter 01/24/2018   Reactive depression    Hypokalemia    Leukocytosis    Essential hypertension    Trauma    Tetraparesis (HCC)    Neuropathic pain    Neurogenic bowel    Neurogenic bladder    Benign essential HTN    Acute blood loss anemia    Central cord syndrome at C6 level of cervical spinal cord ([place]) 11/29/2017   Central cord syndrome [facility]) 11/29/2017   Harrel Carina, MS, OTR/L   Harrel Carina, OT 04/08/2021, 12:32 PM  [place] [facility] SERVICES [address] [place], [place], [address] Phone: [phone]   Fax:  [phone]  Name: SHAQUETA CASADY MRN: [id number] Date of Birth: [birth year]

## 2021-04-13 ENCOUNTER — Other Ambulatory Visit: Payer: Self-pay

## 2021-04-13 ENCOUNTER — Ambulatory Visit: Payer: Medicare HMO

## 2021-04-13 ENCOUNTER — Ambulatory Visit: Payer: Medicare HMO | Admitting: Occupational Therapy

## 2021-04-13 DIAGNOSIS — R262 Difficulty in walking, not elsewhere classified: Secondary | ICD-10-CM

## 2021-04-13 DIAGNOSIS — R278 Other lack of coordination: Secondary | ICD-10-CM

## 2021-04-13 DIAGNOSIS — M6281 Muscle weakness (generalized): Secondary | ICD-10-CM | POA: Diagnosis not present

## 2021-04-13 DIAGNOSIS — R2681 Unsteadiness on feet: Secondary | ICD-10-CM

## 2021-04-13 NOTE — Therapy (Deleted)
[place] [facility] SERVICES [address] [place], [place], [address] Phone: [phone]   Fax:  [phone]  Occupational Therapy Treatment  Patient Details  Name: Sherry [name] MRN: [id number] Date of Birth: 1934/07/03 No data recorded  Encounter Date: 04/13/2021   OT End of Session - 04/13/21 1108     Visit Number 110    Number of Visits 154    Date for OT Re-Evaluation 07/01/21    Authorization Time Period Progress report period starting 11/26/2020    Equipment Utilized During Treatment moist heat    Activity Tolerance Patient tolerated treatment well    Behavior During Therapy [facility] for tasks assessed/performed             Past Medical History:  Diagnosis Date   Acute blood loss anemia    Arthritis    Cancer ([place])    skin   Central cord syndrome at C6 level of cervical spinal cord ([place]) 11/29/2017   Hypertension    Protein-calorie malnutrition, severe ([place]) 01/24/2018   S/P insertion of IVC (inferior vena caval) filter 01/24/2018   Tetraparesis ([place])     Past Surgical History:  Procedure Laterality Date   ANTERIOR CERVICAL DECOMP/DISCECTOMY FUSION N/A 11/29/2017   Procedure: Cervical five-six, six-seven Anterior Cervical Decompression Fusion;  Surgeon: Judith Part, MD;  Location: [place];  Service: Neurosurgery;  Laterality: N/A;  Cervical five-six, six-seven Anterior Cervical Decompression Fusion   CATARACT EXTRACTION     IR IVC FILTER PLMT / S&I /IMG GUID/MOD SED  12/08/2017    There were no vitals filed for this visit.                              OT Long Term Goals - 04/13/21 1113       OT LONG TERM GOAL #1   Title Patient and caregiver will demonstrate understanding of home exercise program for ROM.    Baseline Pt. has recently been in quarantine for COVID-19. Pt.'s current restorative aide is retiring at the end of December. Pt. continues to have a restorative aide rehab aide  assist her wih exercises at [facility]. 8/8: Independently completes/directs HEP mulitple times/week. 12/17/2020: Pt. has a new restorative aide who will be resuming ROM program.    Time 12    Period Weeks    Status On-going      OT LONG TERM GOAL #4   Title Patient will demonstrate improved composite finger flexion to hold deodorant to apply to underarms.    Baseline 01/12/2021:  Pt. is progressing, and is able to reach to grasp, and hold narrow cones. 10/20/2020: Pt. conitnues to make steady progress with bilateral digit flexion.Pt. continues to present with stiifness/tightness which is hindering pt.'s ability to formulate a full composite fist. Pt. continues to present with improving finger flexion of R hand for a partial gross grasp, but unable to grasp and hold items. pt. conitnues to present with limited digit flexion of L hand. Pt. has active left thumb abduction. 8/8: holds cup with 2 hands, continues to have limited composite finger flexion L worse than R. 02/16/2021: pt. continues to present with limited bilateral gross composite fisting. 04/08/2021: pt. continues to present with limited digit flexion affecting her ability to formulate bilateral composite fisting. 110th visivt: Pt. is able to hold the utensils in preparation for self-feeding.    Time 12    Period Weeks    Status  On-going    Target Date 07/01/21      OT LONG TERM GOAL #6   Title Patient will increase right UE ROM to comb the back of her hair with modified independence.    Baseline 01/12/2022: Pt. is able to comb her hair, reaching to the back of her hair.. 10/20/2020: Pt. continues to progress wtith RRUE ROM, and is able to use a pic for the back of her head, however is unable to brush the back of her hair. 07/30/2020: Pt. is now able to comb the back of her hair with a pic, however continues to work on completing the task efficiently. 8/8: Achieves with great effort, has assistance for efficiancy. 02/16/2021: Pt. is now able to  reach, and brush the back of her hair. 04/08/2021: pt. continues to improve reach towards the back of her hair. 110th visit: Pt. is to reach towards the back of her hair, however has difficulty sustaining her arms in elevaton for the entire duration of perfroming hair cre thoroughly.    Time 12    Period Weeks    Status Partially Met    Target Date 07/01/21      OT LONG TERM GOAL #8   Title Pt. will write, and sign her name with 100% legibility, and modified independence.    Baseline 01/12/2021: Pt. continnues to fill out her daily menu and complete puzzles. 10/20/2020: Pt. continues to make progress overall. Pt. continues to consistently fill out her daily menu, and writing for crossword puzzles. Pt. is able to maintain grasp on a wide width pen. Contineus to work on Media [employer]anner. 8/8: writes daily, continues to work on improving speed. 02/16/2021: Pt. continues to present wiht limited writing legibility, fluctuating daily. 04/08/2021: Pt. continues to to be able to fill out her daily menu.limited writing legibility for daily puzzles.110th visit: Pt. continues to present with limited legibility, and efficiency for completing daily puzzles.    Time 12    Period Weeks    Status Partially Met    Target Date 07/01/21                   Plan - 04/13/21 1113     Clinical Impression Statement p    OT Occupational Profile and History Detailed Assessment- Review of Records and additional review of physical, cognitive, psychosocial history related to current functional performance    Occupational performance deficits (Please refer to evaluation for details): ADL's;IADL's;Leisure;Social Participation    Body Structure / Function / Physical Skills ADL;Continence;Dexterity;Flexibility;Strength;ROM;Balance;Coordination;FMC;IADL;Endurance;UE functional use;Decreased knowledge of use of DME;GMC    Psychosocial Skills Coping Strategies    Rehab Potential Fair    Clinical Decision Making Several  treatment options, min-mod task modification necessary    Comorbidities Affecting Occupational Performance: Presence of comorbidities impacting occupational performance    Modification or Assistance to Complete Evaluation  No modification of tasks or assist necessary to complete eval    OT Frequency 2x / week    OT Duration 12 weeks    OT Treatment/Interventions Self-care/ADL training;Cryotherapy;Therapeutic exercise;DME and/or AE instruction;Balance training;Neuromuscular education;Manual Therapy;Splinting;Moist Heat;Passive range of motion;Therapeutic activities;Patient/family education    Plan continue to progress ROM of digits, wrists and shoulders as it pertains to completion of ADL tasks that pt values.    Consulted and Agree with Plan of Care Patient             Patient will benefit from skilled therapeutic intervention in order to improve the following deficits and impairments:   Body  Structure / Function / Physical Skills: ADL, Continence, Dexterity, Flexibility, Strength, ROM, Balance, Coordination, FMC, IADL, Endurance, UE functional use, Decreased knowledge of use of DME, GMC   Psychosocial Skills: Coping Strategies   Visit Diagnosis: Muscle weakness (generalized)    Problem List Patient Active Problem List   Diagnosis Date Noted   Acute appendicitis with appendiceal abscess 05/30/2018   Hypocalcemia 02/15/2018   Dysuria 02/15/2018   Bilateral lower extremity edema 02/11/2018   Vaginal yeast infection 01/30/2018   Chest tightness 01/27/2018   Healthcare-associated pneumonia 01/25/2018   Pleural effusion, not elsewhere classified 01/25/2018   Acute deep vein thrombosis (DVT) of left lower extremity ([place]) 01/24/2018   Acute deep vein thrombosis (DVT) of right lower extremity ([place]) 01/24/2018   Chronic allergic rhinitis 01/24/2018   Depression with anxiety 01/24/2018   UTI due to Klebsiella species 01/24/2018   Protein-calorie malnutrition, severe ([place]) 01/24/2018    S/P insertion of IVC (inferior vena caval) filter 01/24/2018   Reactive depression    Hypokalemia    Leukocytosis    Essential hypertension    Trauma    Tetraparesis (HCC)    Neuropathic pain    Neurogenic bowel    Neurogenic bladder    Benign essential HTN    Acute blood loss anemia    Central cord syndrome at C6 level of cervical spinal cord ([place]) 11/29/2017   Central cord syndrome [facility]) 11/29/2017    Harrel Carina, OT 04/13/2021, 11:57 AM  [place], [place], [address] Phone: [phone]   Fax:  [phone]  Name: JAYELLE PAGE MRN: [id number] Date of Birth: 06/02/1934

## 2021-04-13 NOTE — Therapy (Incomplete)
[place] MAIN [facility] SERVICES [address] [place], [place], [address] Phone: [phone]   Fax:  [phone]  Physical Therapy Treatment  Patient Details  Name: Sherry [name] MRN: [id number] Date of Birth: 10/16/1934 No data recorded  Encounter Date: 04/13/2021    Past Medical History:  Diagnosis Date   Acute blood loss anemia    Arthritis    Cancer ([place])    skin   Central cord syndrome at C6 level of cervical spinal cord ([place]) 11/29/2017   Hypertension    Protein-calorie malnutrition, severe ([place]) 01/24/2018   S/P insertion of IVC (inferior vena caval) filter 01/24/2018   Tetraparesis ([place])     Past Surgical History:  Procedure Laterality Date   ANTERIOR CERVICAL DECOMP/DISCECTOMY FUSION N/A 11/29/2017   Procedure: Cervical five-six, six-seven Anterior Cervical Decompression Fusion;  Surgeon: Judith Part, MD;  Location: [place];  Service: Neurosurgery;  Laterality: N/A;  Cervical five-six, six-seven Anterior Cervical Decompression Fusion   CATARACT EXTRACTION     IR IVC FILTER PLMT / S&I /IMG GUID/MOD SED  12/08/2017    There were no vitals filed for this visit.   Therapeutic activity:  Sit to stand  Static standing:  1) 2 min 8 sec 2) 2 min 22 sec 3) 30 sec - Right knee buckled 4) 1 min 47 sec                               PT Short Term Goals - 03/10/20 2211       PT SHORT TERM GOAL #1   Title The patient will perform initial HEP with minimum assistance in order to improve strength and function.    Baseline Patient demonstrating independence with initial HEP as of 03/10/2020 with no questions or difficulty.    Time 4    Period Weeks    Status Achieved    Target Date 02/11/20               PT Long Term Goals - 03/30/21 1150       PT LONG TERM GOAL #1   Title The patient will be compliant with finalized HEP with minimum assistance in preparation for self management and  maintenance of condition.    Baseline 03/10/2020- Patient familiar with initial HEP and will keep goal active as HEP becomes more progressive including possible transfers and standing activities. 04/07/2020- Patient continues to report compliance with LE strengthening home exercises without questions or concerns. 04/21/2020- Patient able to verbalize and demonstrate good understanding of current HEP including some supine and seated LE strengthening exercises. Reviewed and patient performed 10 reps today with minimal cueing. Will keep goal active to progress HEP as appropriate. 05/28/2020- patient reports continues to perform LE strengthening as able and some help from caregiver as able. No changes at this time to home program as patient is limited to supine/seated therex. 07/02/2020- Patient continues to report compliance with current supine and seated LE strengtheing home program and states no questions or concerns regarding her plan.    Time 12    Period Weeks    Status Achieved      PT LONG TERM GOAL #2   Title The patient will demonstrate at least 10 point improvement on FOTO score indicating an improved ability to perform functional activities.    Baseline on eval (/720) score 12; 10/22/19: 12; 12/10/19: 18, 12/13: 18: 04/07/2020- Will assess next visit. 07/02/2020-  Will obtain next visit. 09/24/2020= Will obtain next visit. 11/12/2020= 12; 12/22/2020= 12; 03/30/2021= 17    Time 12    Period Weeks    Status On-going    Target Date 06/22/21      PT LONG TERM GOAL #3   Title The patient will demonstrate lateral scooting  for assistance with transfers with minA to maximize independence.    Baseline 10/22/19: Pt requires modA+1 for lateral scooting; 12/10/19: modA+1, 12/13: modA-maxA, 12/27: modA-maxA, 03/10/2020- ModA-MaxA- increased verbal cues and visual demonstration. 04/07/2020- Continued Max assist to perform forward/lateral scoot. 04/21/2020- Patient was able to demo slight lateral scoot with CGA approx 12 in  then fatigued requiring max assist- she is able to scoot forward by leaning without significant issues. 05/28/2020- While sitting in chair- patient able to perform forward and lateral scoot with VC/visual demo and increased time allowed- CGA today but not consistent yet- will keep goal active at this time to continue to focus on strengthening and improving technique. 07/02/2020- Patient able to demonstrate minimal ability to laterally shift weigh on mat table requiring max assist yet is able to forward scoot out to edge of mat table with min Assist. 08/20/2020 - Patient was able to demonstrate minimal lateral scooting at edge of mat while holding feet still so they did not slip on tile surface. She continues to be able to demonstrate scoot forward and uses tilt option to recline to position safely back into seat of power wheelchair. 09/25/2020- Patient able to sit on mat and minimally scoot laterally but requires min/mod A for efficient scooting. Will end goal at this time due to plateau in progress with scooting over extended time.    Time 12    Period Weeks    Status Not Met      PT LONG TERM GOAL #4   Title The patient will demonstrate a squat pivot transfer with minimum assistance to maximize independence and mobility.    Baseline 10/22/19: unable to safely attempt at this time; 12/10/19: unable to safely attempt at this time, 01/14/20: unable to safety attempt at this time. 03/10/2020- Patient able to perform Stand pivot transfer with Maximal assist. 04/07/2020- Patient continues to require Max assist with sit to stand- will assist SPT next visit. 04/21/2020- Patient presents with Max assist to perform Sit to stand Transfer- unable to pivot or move Left LE well without difficulty. 07/02/2020= Max assist with SPT and minimal ability to pivot. 7/202/2022-Patient able to stand with minimal assist this visit (as good as CGA last visit) and able to move right foot to pivot and performing with moderate assist today.  09/24/2020- Patient able to perform sit to stand with Min assist from lift chair. Patient has demo inconsistency and will keep goal active.  12/17/2020- Goal is currently not appropriate as patient fractured her ankle and currently NWB. 12/22/2020=Goal is currently not appropriate as patient fractured her ankle and currently NWB.  03/30/2021=Patient was able to demo min assist to stand holding onto PT back of arms yet continues to require max assist to pivot as she was unable to pivot or turn her legs consistently today.    Time 12    Period Weeks    Status On-going    Target Date 06/22/21      PT LONG TERM GOAL #5   Title The patient will demonstrate sitting without UE support for 2-5 minutes at EOB to improve participation and maximize independence with ADLs.    Baseline 10/22/19: Pt able to sit  unsupported for at least 2 minutes at edge of bed. 04/21/2020- Patient continues to demo good sitting at edge of mat > 5 miin without difficulty or back support.    Time 12    Period Weeks    Status Achieved      PT LONG TERM GOAL #6   Title Patient will demonstrate improved functional LE strength as seen by consistent ability to stand > 2 min (3 out of 4 trials) with Mod/Max A for improved LE strength with transfers.    Baseline 05/28/2020- Patient able to inconsistent stand 1-2 min right now in //bars with Mod/Max A. 07/02/2020- Patient was able to progress last 2 visit to standing using bilateral platform attachment between 1-2 min which is a progression from the parallel bars. 08/20/2020- Patient performed static standing with min- mod A using bilateral platform attachment on walker for 6 min 30 sec today with assist for trunk control and intermittent verbal cues for posture. 09/24/2020- Patient has consistently been able to stand >2 min in // bars and when using bilateral Platform walker. 12/23/2020= Will reopen this goal as patient should transition back to weightbearing and again practice standing in new cert.  03/30/2021=1) 1 min 23 sec   2)  55min 40 sec  3) 1 min 50 sec   Left LE hyperextended with increased difficulty tucking/extending hips today. Requiring more mod assist to remain standing.    Time 12    Period Weeks    Status Deferred    Target Date 06/22/21      PT LONG TERM GOAL #7   Title Patient/caregiver will demonstrate assisted transfer using [place] Lift consistently Independently for improved ability to transfer at home from lift chair to bed.    Baseline 09/24/2020- Patient requires hoyer lift (Dependent) for safe transfers.  12/17/2020- Patient currently still dependent on hoyer as she is currently non-weight bearing due to recent ankle fracture. 03/30/2021- Patient stated she has not tried. Agreeable to attempt in near future.    Time 12    Period Weeks    Status On-going    Target Date 06/22/21      PT LONG TERM GOAL #8   Title Patient will demo improved right LE quad strength as seen by ability to extend knee to 20 deg from 0 or less while performing a Long arc quad (for improved terminal knee ext and more full ROM)  for improved ability to bearing weight with Right LE.    Baseline 12/22/2020=able to extend knee to approx 35 deg from zero during long arc quad; 2/27/203- WIll reassess next visit    Time 12    Period Weeks    Status New    Target Date 06/22/21                    Patient will benefit from skilled therapeutic intervention in order to improve the following deficits and impairments:     Visit Diagnosis: No diagnosis found.     Problem List Patient Active Problem List   Diagnosis Date Noted   Acute appendicitis with appendiceal abscess 05/30/2018   Hypocalcemia 02/15/2018   Dysuria 02/15/2018   Bilateral lower extremity edema 02/11/2018   Vaginal yeast infection 01/30/2018   Chest tightness 01/27/2018   Healthcare-associated pneumonia 01/25/2018   Pleural effusion, not elsewhere classified 01/25/2018   Acute deep vein thrombosis (DVT) of left  lower extremity ([place]) 01/24/2018   Acute deep vein thrombosis (DVT) of right lower extremity ([place]) 01/24/2018  Chronic allergic rhinitis 01/24/2018   Depression with anxiety 01/24/2018   UTI due to Klebsiella species 01/24/2018   Protein-calorie malnutrition, severe ([place]) 01/24/2018   S/P insertion of IVC (inferior vena caval) filter 01/24/2018   Reactive depression    Hypokalemia    Leukocytosis    Essential hypertension    Trauma    Tetraparesis ([facility])    Neuropathic pain    Neurogenic bowel    Neurogenic bladder    Benign essential HTN    Acute blood loss anemia    Central cord syndrome at C6 level of cervical spinal cord ([place]) 11/29/2017   Central cord syndrome ([place]) 11/29/2017    Lewis Moccasin, PT 04/13/2021, 11:59 AM  [place] MAIN [facility] SERVICES [place], [place], [address] Phone: [phone]   Fax:  [phone]  Name: Sherry [name] MRN: [id number] Date of Birth: [birth year]

## 2021-04-13 NOTE — Therapy (Addendum)
[place] MAIN [facility] SERVICES [address] [place], [place], [address] Phone: [phone]   Fax:  [phone]  Occupational Therapy Progress Note  Dates of reporting period  02/16/2021   to   04/13/2021      Patient Details  Name: Sherry [name] MRN: [id number] Date of Birth: November 07, 1934 No data recorded  Encounter Date: 04/13/2021   OT End of Session - 04/13/21 1108     Visit Number 110    Number of Visits 154    Date for OT Re-Evaluation 07/01/21    Authorization Time Period Progress report period starting 11/26/2020    Equipment Utilized During Treatment moist heat    Activity Tolerance Patient tolerated treatment well    Behavior During Therapy [facility] for tasks assessed/performed             Past Medical History:  Diagnosis Date   Acute blood loss anemia    Arthritis    Cancer ([place])    skin   Central cord syndrome at C6 level of cervical spinal cord ([place]) 11/29/2017   Hypertension    Protein-calorie malnutrition, severe ([place]) 01/24/2018   S/P insertion of IVC (inferior vena caval) filter 01/24/2018   Tetraparesis ([place])     Past Surgical History:  Procedure Laterality Date   ANTERIOR CERVICAL DECOMP/DISCECTOMY FUSION N/A 11/29/2017   Procedure: Cervical five-six, six-seven Anterior Cervical Decompression Fusion;  Surgeon: Judith Part, MD;  Location: [place];  Service: Neurosurgery;  Laterality: N/A;  Cervical five-six, six-seven Anterior Cervical Decompression Fusion   CATARACT EXTRACTION     IR IVC FILTER PLMT / S&I /IMG GUID/MOD SED  12/08/2017    There were no vitals filed for this visit.  OT TREATMENT     Pt. tolerated PROM bilateral wrist extension, digit MP, PIP, and DIP flexion, and extension, thumb radial, and palmar abduction in conjunction with moist heat. Emphasis was placed on left wrist extension, bilateral thumb radial, and palmar abduction, and bilateral digit MP, PIP, and DIP flexion.     Measurements were obtained, and goals were reviewed with the pt. Pt. has had multiple cancellations during this past progress reporting period secondary to having COVID-19, being quarantined, and not feeling which limited progress. Pt. Has started feeling better, however continues to present with weakness. Pt. continues to present with bilateral wrist, and digit tightness, and pt. continues to work towards improving with reaching with her BUEs, and sustain her arms in elevation in preparation for improving overhead ADL tasks including: grooming/haircare, reaching, and writing. Pt. was able to tolerate the UBE without difficulty with increasing resistance. Pt. tolerated ROM well without reports of pain, or discomfort following moist heat modality. Pt. continues to present with bilateral wrist flexor tightness, and digit MP, PIP, and DIP extensor tightness. Pt. continues to benefit from working on impoving ROM in order to work towards increasing engagement of her bilateral hands during ADLs, and IADL tasks.               [facility] OT Assessment - 04/13/21 1205       Hand Function   Comment Digit flexion to [facility] passively: R: 2nd: 3cm, 3rd: 5.5cm, 4th: 3 cm, 5th: 5.5cm, Left 2nd: 7.7cm, 3rd: 7cm, 4th: 6.5cm, 5th: 6cm                                   OT Long Term Goals - 04/13/21 1113  OT LONG TERM GOAL #1   Title Patient and caregiver will demonstrate understanding of home exercise program for ROM.    Baseline Pt. has recently been in quarantine for COVID-19. Pt.'s current restorative aide is retiring at the end of December. Pt. continues to have a restorative aide rehab aide assist her wih exercises at [facility]. 8/8: Independently completes/directs HEP mulitple times/week. 12/17/2020: Pt. has a new restorative aide who will be resuming ROM program.    Time 12    Period Weeks    Status On-going      OT LONG TERM GOAL #4   Title Patient will demonstrate improved  composite finger flexion to hold deodorant to apply to underarms.    Baseline 01/12/2021:  Pt. is progressing, and is able to reach to grasp, and hold narrow cones. 10/20/2020: Pt. conitnues to make steady progress with bilateral digit flexion.Pt. continues to present with stiifness/tightness which is hindering pt.'s ability to formulate a full composite fist. Pt. continues to present with improving finger flexion of R hand for a partial gross grasp, but unable to grasp and hold items. pt. conitnues to present with limited digit flexion of L hand. Pt. has active left thumb abduction. 8/8: holds cup with 2 hands, continues to have limited composite finger flexion L worse than R. 02/16/2021: pt. continues to present with limited bilateral gross composite fisting. 04/08/2021: pt. continues to present with limited digit flexion affecting her ability to formulate bilateral composite fisting. 110th visivt: Pt. is able to hold the utensils in preparation for self-feeding.    Time 12    Period Weeks    Status On-going    Target Date 07/01/21      OT LONG TERM GOAL #6   Title Patient will increase right UE ROM to comb the back of her hair with modified independence.    Baseline 01/12/2022: Pt. is able to comb her hair, reaching to the back of her hair.. 10/20/2020: Pt. continues to progress wtith RRUE ROM, and is able to use a pic for the back of her head, however is unable to brush the back of her hair. 07/30/2020: Pt. is now able to comb the back of her hair with a pic, however continues to work on completing the task efficiently. 8/8: Achieves with great effort, has assistance for efficiancy. 02/16/2021: Pt. is now able to reach, and brush the back of her hair. 04/08/2021: pt. continues to improve reach towards the back of her hair. 110th visit: Pt. is to reach towards the back of her hair, however has difficulty sustaining her arms in elevaton for the entire duration of perfroming hair cre thoroughly.    Time 12     Period Weeks    Status Partially Met    Target Date 07/01/21      OT LONG TERM GOAL #8   Title Pt. will write, and sign her name with 100% legibility, and modified independence.    Baseline 01/12/2021: Pt. continnues to fill out her daily menu and complete puzzles. 10/20/2020: Pt. continues to make progress overall. Pt. continues to consistently fill out her daily menu, and writing for crossword puzzles. Pt. is able to maintain grasp on a wide width pen. Contineus to work on Media planner. 8/8: writes daily, continues to work on improving speed. 02/16/2021: Pt. continues to present wiht limited writing legibility, fluctuating daily. 04/08/2021: Pt. continues to to be able to fill out her daily menu.limited writing legibility for daily puzzles.110th visit: Pt. continues to present  with limited legibility, and efficiency for completing daily puzzles.    Time 12    Period Weeks    Status Partially Met    Target Date 07/01/21                   Plan - 04/13/21 1113     Clinical Impression Statement Measurements were obtained, and goals were reviewed with the pt. Pt. has had multiple cancellations during this past progress reporting period secondary to having COVID-19, being quarantined, and not feeling which limited progress. Pt. Has started feeling better, however continues to present with weakness. Pt. continues to present with bilateral wrist, and digit tightness, and pt. continues to work towards improving with reaching with her BUEs, and sustain her arms in elevation in preparation for improving overhead ADL tasks including: grooming/haircare, reaching, and writing. Pt. was able to tolerate the UBE without difficulty with increasing resistance. Pt. tolerated ROM well without reports of pain, or discomfort following moist heat modality. Pt. continues to present with bilateral wrist flexor tightness, and digit MP, PIP, and DIP extensor tightness. Pt. continues to benefit from working on  impoving ROM in order to work towards increasing engagement of her bilateral hands during ADLs, and IADL tasks.          OT Occupational Profile and History Detailed Assessment- Review of Records and additional review of physical, cognitive, psychosocial history related to current functional performance    Occupational performance deficits (Please refer to evaluation for details): ADL's;IADL's;Leisure;Social Participation    Body Structure / Function / Physical Skills ADL;Continence;Dexterity;Flexibility;Strength;ROM;Balance;Coordination;FMC;IADL;Endurance;UE functional use;Decreased knowledge of use of DME;GMC    Psychosocial Skills Coping Strategies    Rehab Potential Fair    Clinical Decision Making Several treatment options, min-mod task modification necessary    Comorbidities Affecting Occupational Performance: Presence of comorbidities impacting occupational performance    Modification or Assistance to Complete Evaluation  No modification of tasks or assist necessary to complete eval    OT Frequency 2x / week    OT Duration 12 weeks    OT Treatment/Interventions Self-care/ADL training;Cryotherapy;Therapeutic exercise;DME and/or AE instruction;Balance training;Neuromuscular education;Manual Therapy;Splinting;Moist Heat;Passive range of motion;Therapeutic activities;Patient/family education    Plan continue to progress ROM of digits, wrists and shoulders as it pertains to completion of ADL tasks that pt values.    Consulted and Agree with Plan of Care Patient             Patient will benefit from skilled therapeutic intervention in order to improve the following deficits and impairments:   Body Structure / Function / Physical Skills: ADL, Continence, Dexterity, Flexibility, Strength, ROM, Balance, Coordination, FMC, IADL, Endurance, UE functional use, Decreased knowledge of use of DME, GMC   Psychosocial Skills: Coping Strategies   Visit Diagnosis: Muscle weakness  (generalized)    Problem List Patient Active Problem List   Diagnosis Date Noted   Acute appendicitis with appendiceal abscess 05/30/2018   Hypocalcemia 02/15/2018   Dysuria 02/15/2018   Bilateral lower extremity edema 02/11/2018   Vaginal yeast infection 01/30/2018   Chest tightness 01/27/2018   Healthcare-associated pneumonia 01/25/2018   Pleural effusion, not elsewhere classified 01/25/2018   Acute deep vein thrombosis (DVT) of left lower extremity ([place]) 01/24/2018   Acute deep vein thrombosis (DVT) of right lower extremity ([place]) 01/24/2018   Chronic allergic rhinitis 01/24/2018   Depression with anxiety 01/24/2018   UTI due to Klebsiella species 01/24/2018   Protein-calorie malnutrition, severe ([place]) 01/24/2018   S/P insertion of IVC (inferior vena  caval) filter 01/24/2018   Reactive depression    Hypokalemia    Leukocytosis    Essential hypertension    Trauma    Tetraparesis (HCC)    Neuropathic pain    Neurogenic bowel    Neurogenic bladder    Benign essential HTN    Acute blood loss anemia    Central cord syndrome at C6 level of cervical spinal cord ([place]) 11/29/2017   Central cord syndrome [facility]) 11/29/2017   Harrel Carina, MS, OTR/L   Harrel Carina, OT 04/13/2021, 12:07 PM  [place] MAIN [facility] SERVICES [address] [place], [place], [address] Phone: [phone]   Fax:  [phone]  Name: Sherry [name] MRN: [id number] Date of Birth: May 01, 1934

## 2021-04-15 ENCOUNTER — Ambulatory Visit: Payer: Medicare HMO

## 2021-04-15 ENCOUNTER — Other Ambulatory Visit: Payer: Self-pay

## 2021-04-15 ENCOUNTER — Ambulatory Visit: Payer: Medicare HMO | Admitting: Occupational Therapy

## 2021-04-15 ENCOUNTER — Encounter: Payer: Self-pay | Admitting: Occupational Therapy

## 2021-04-15 DIAGNOSIS — M6281 Muscle weakness (generalized): Secondary | ICD-10-CM

## 2021-04-15 DIAGNOSIS — R262 Difficulty in walking, not elsewhere classified: Secondary | ICD-10-CM

## 2021-04-15 DIAGNOSIS — R2681 Unsteadiness on feet: Secondary | ICD-10-CM

## 2021-04-15 NOTE — Therapy (Signed)
[facility] ?[place], [place], [address] ?Phone: [phone]   Fax:  [phone] ? ?Physical Therapy Treatment ? ?Patient Details  ?Name: Sherry [name] ?MRN: [id number] ?Date of Birth: [birth year] ?No data recorded ? ?Encounter Date: 04/15/2021 ? ? PT End of Session - 04/15/21 1318   ? ? Visit Number 134   ? Number of Visits 136   ? Date for PT Re-Evaluation 06/22/21   ? Authorization Type [employer] FOTO performed by PT on eval (7/20), score 12, Progress note on 03/10/2020; PN on 08/20/2020   ? Authorization Time Period Recert 04/09/1015- 06/11/2583; PN on 03/10/7822; Recert 2/35/3614- 43/15/4008; PN on 6/76/1950; Recert 93/26/7124- 5/80/9983; Recert 3/82/5053-9/76/7341   ? PT Start Time 1145   ? PT Stop Time 1223   ? PT Time Calculation (min) 38 min   ? Equipment Utilized During Treatment Gait belt   ? Activity Tolerance Patient tolerated treatment well;Patient limited by fatigue   ? Behavior During Therapy [facility] for tasks assessed/performed   ? ?  ?  ? ?  ? ? ?Past Medical History:  ?Diagnosis Date  ? Acute blood loss anemia   ? Arthritis   ? Cancer [facility])   ? skin  ? Central cord syndrome at C6 level of cervical spinal cord ([place]) 11/29/2017  ? Hypertension   ? Protein-calorie malnutrition, severe ([place]) 01/24/2018  ? S/P insertion of IVC (inferior vena caval) filter 01/24/2018  ? Tetraparesis ([place])   ? ? ?Past Surgical History:  ?Procedure Laterality Date  ? ANTERIOR CERVICAL DECOMP/DISCECTOMY FUSION N/A 11/29/2017  ? Procedure: Cervical five-six, six-seven Anterior Cervical Decompression Fusion;  Surgeon: Judith Part, MD;  Location: [facility];  Service: Neurosurgery;  Laterality: N/A;  Cervical five-six, six-seven Anterior Cervical Decompression Fusion  ? CATARACT EXTRACTION    ? IR IVC FILTER PLMT / S&I /IMG GUID/MOD SED  12/08/2017  ? ? ?There were no vitals filed for this visit. ? ? Subjective Assessment - 04/15/21 1316   ? ? Subjective  Patient states having another good day without complaints. States restorative aide at facility has not restarted yet.   ? Pertinent History Pt is an 87 yo female that fell in 2019, fractured vertebrae in her neck and in her low back per family. 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 After hospital stay, pt discharged to [facility] for ~1 month, experienced severe UTI as well as bilateral DVT (IVC filter placed). Discharged to [facility], stayed for about a year, and then moved to [facility] in April 2021. Was receiving PT, but per family facility reported that did not have adequate equipment to maximize PT for her. Pt until about 1 month ago was practicing sit to stand transfers with 1-2 people, and working on static standing. Has a brace for L foot due to PF. Pt currently needs assistance with all ADLs (able to assist with donning/doffing her shirt), bed baths, and needs a hoyer lift for transfers. Able to drive power wheelchair without assistance.   ? ?  ?  ? ?  ? ?  ? ?Therapeutic activity: ?  ?Sit to stand x 4 trials to perform below activities- Mod assist today- VC to extend hips and position feet to stand today. Patient requried physical assist to anterior weight shift to more erect standing position ?  ?Static standing: ?1) 3 min 44 sec ?2) 2 min 50 sec ?3) 2 min - 40 sec - with some dynamic lateral  weight shifting with min assist- stopped due to fatigue.  ?4) 1 min 47 sec  ?5) 1 min 20 sec - Min assist to attain erect position. Patient denied any knee pain today yet presents with hyperextension.  ?  ?Therex:  ?Bilateral Hip march with AAROM - 2 sets x 12 reps ?Bilateral Knee ext with 3 sec hold and 3 sec slow return  x 12 (AAROM on Right)  ? ?Education provided throughout session via VC/TC and demonstration to facilitate movement at target joints and correct muscle activation for all testing and exercises performed.  ? ? ? ? ? ? ? ? ? ? ? ? ? ? ? ? ? ? ? ? ? ? ? PT Education - 04/15/21 1317    ? ? Education Details Standing/postural techniques   ? Person(s) Educated Patient   ? Methods Explanation;Demonstration;Tactile cues;Verbal cues   ? Comprehension Verbalized understanding;Returned demonstration;Verbal cues required;Tactile cues required;Need further instruction   ? ?  ?  ? ?  ? ? ? PT Short Term Goals - 03/10/20 2211   ? ?  ? PT SHORT TERM GOAL #1  ? Title The patient will perform initial HEP with minimum assistance in order to improve strength and function.   ? Baseline Patient demonstrating independence with initial HEP as of 03/10/2020 with no questions or difficulty.   ? Time 4   ? Period Weeks   ? Status Achieved   ? Target Date 02/11/20   ? ?  ?  ? ?  ? ? ? ? PT Long Term Goals - 03/30/21 1150   ? ?  ? PT LONG TERM GOAL #1  ? Title The patient will be compliant with finalized HEP with minimum assistance in preparation for self management and maintenance of condition.   ? Baseline 03/10/2020- Patient familiar with initial HEP and will keep goal active as HEP becomes more progressive including possible transfers and standing activities. 04/07/2020- Patient continues to report compliance with LE strengthening home exercises without questions or concerns. 04/21/2020- Patient able to verbalize and demonstrate good understanding of current HEP including some supine and seated LE strengthening exercises. Reviewed and patient performed 10 reps today with minimal cueing. Will keep goal active to progress HEP as appropriate. 05/28/2020- patient reports continues to perform LE strengthening as able and some help from caregiver as able. No changes at this time to home program as patient is limited to supine/seated therex. 07/02/2020- Patient continues to report compliance with current supine and seated LE strengtheing home program and states no questions or concerns regarding her plan.   ? Time 12   ? Period Weeks   ? Status Achieved   ?  ? PT LONG TERM GOAL #2  ? Title The patient will demonstrate at least  10 point improvement on FOTO score indicating an improved ability to perform functional activities.   ? Baseline on eval (/720) score 12; 10/22/19: 12; 12/10/19: 18, 12/13: 18: 04/07/2020- Will assess next visit. 07/02/2020- Will obtain next visit. 09/24/2020= Will obtain next visit. 11/12/2020= 12; 12/22/2020= 12; 03/30/2021= 17   ? Time 12   ? Period Weeks   ? Status On-going   ? Target Date 06/22/21   ?  ? PT LONG TERM GOAL #3  ? Title The patient will demonstrate lateral scooting  for assistance with transfers with minA to maximize independence.   ? Baseline 10/22/19: Pt requires modA+1 for lateral scooting; 12/10/19: modA+1, 12/13: modA-maxA, 12/27: modA-maxA, 03/10/2020- ModA-MaxA- increased verbal cues and  visual demonstration. 04/07/2020- Continued Max assist to perform forward/lateral scoot. 04/21/2020- Patient was able to demo slight lateral scoot with CGA approx 12 in then fatigued requiring max assist- she is able to scoot forward by leaning without significant issues. 05/28/2020- While sitting in chair- patient able to perform forward and lateral scoot with VC/visual demo and increased time allowed- CGA today but not consistent yet- will keep goal active at this time to continue to focus on strengthening and improving technique. 07/02/2020- Patient able to demonstrate minimal ability to laterally shift weigh on mat table requiring max assist yet is able to forward scoot out to edge of mat table with min Assist. 08/20/2020 - Patient was able to demonstrate minimal lateral scooting at edge of mat while holding feet still so they did not slip on tile surface. She continues to be able to demonstrate scoot forward and uses tilt option to recline to position safely back into seat of power wheelchair. 09/25/2020- Patient able to sit on mat and minimally scoot laterally but requires min/mod A for efficient scooting. Will end goal at this time due to plateau in progress with scooting over extended time.   ? Time 12   ? Period  Weeks   ? Status Not Met   ?  ? PT LONG TERM GOAL #4  ? Title The patient will demonstrate a squat pivot transfer with minimum assistance to maximize independence and mobility.   ? Baseline 10/22/19: unable to

## 2021-04-15 NOTE — Therapy (Addendum)
[facility] ?[place], [place], [address] ?Phone: [phone]   Fax:  [phone] ? ?Occupational Therapy Treatment ? ?Patient Details  ?Name: Sherry [name] ?MRN: [id number] ?Date of Birth: Jun 05, 1934 ?No data recorded ? ?Encounter Date: 04/15/2021 ? ? OT End of Session - 04/15/21 1552   ? ? Visit Number [id number]   ? Number of Visits 154   ? Date for OT Re-Evaluation 07/01/21   ? Authorization Time Period Progress report period starting 11/26/2020   ? OT Start Time 1100   ? OT Stop Time 1145   ? OT Time Calculation (min) 45 min   ? Equipment Utilized During Treatment moist heat   ? Activity Tolerance Patient tolerated treatment well   ? Behavior During Therapy [facility] for tasks assessed/performed   ? ?  ?  ? ?  ? ? ?Past Medical History:  ?Diagnosis Date  ? Acute blood loss anemia   ? Arthritis   ? Cancer [facility])   ? skin  ? Central cord syndrome at C6 level of cervical spinal cord ([place]) 11/29/2017  ? Hypertension   ? Protein-calorie malnutrition, severe ([place]) 01/24/2018  ? S/P insertion of IVC (inferior vena caval) filter 01/24/2018  ? Tetraparesis ([place])   ? ? ?Past Surgical History:  ?Procedure Laterality Date  ? ANTERIOR CERVICAL DECOMP/DISCECTOMY FUSION N/A 11/29/2017  ? Procedure: Cervical five-six, six-seven Anterior Cervical Decompression Fusion;  Surgeon: Judith Part, MD;  Location: [facility];  Service: Neurosurgery;  Laterality: N/A;  Cervical five-six, six-seven Anterior Cervical Decompression Fusion  ? CATARACT EXTRACTION    ? IR IVC FILTER PLMT / S&I /IMG GUID/MOD SED  12/08/2017  ? ? ?There were no vitals filed for this visit. ? ? Subjective Assessment - 04/15/21 1551   ? ? Subjective  Pt.'s daughter accompanied the pt. to therapy today.   ? Patient is accompanied by: Family member   ? Pertinent History Patient s/p fall November 29, 2017 resulting in diagnosis of central cord syndrome at C 6 level.  She has had therapy in multiple venues but  with recent move to [facility], therapy staff recommended she seek outpatient therapy for her needs.   ? Patient Stated Goals Patient would like to be able to move better in bed, stand and perform self care tasks.   ? Currently in Pain? No/denies   ? ?  ?  ? ?  ? ?OT TREATMENT   ?  ?Pt. tolerated PROM bilateral wrist extension, digit MP, PIP, and DIP flexion, and extension, thumb radial, and palmar abduction in conjunction with moist heat. Emphasis was placed on left wrist extension, bilateral thumb radial, and palmar abduction, and bilateral digit MP, PIP, and DIP flexion. Pt. worked on [employer], and reciprocal motion using the UBE while sitting for 8 min. with minimal resistance.  ? ?Neuromuscular re-ed: ? ?Pt. worked on bilateral functional reaching while grasping 1: 3/4", and 1/2" flat washers. Pt. Worked on reaching up to place them onto a small horizontal dowel at various angles, and removing them. ?  ?Pt. is making progress with her bilateral UE's. Pt. was able to manipulate the washers with the right hand by sliding them up the side of the dish. Pt. had more difficulty grasping the washers with her left hand. Pt. continues to present with bilateral wrist, and digit tightness, and pt. continues to work towards improving with reaching with her BUEs in preparation for improving overhead ADL tasks including: grooming/haircare,  reaching, and writing. Pt. was able to tolerate the UBE without difficulty with increasing resistance. Pt. tolerated ROM well without reports of pain, or discomfort following moist heat modality. Pt. continues to present with bilateral wrist flexor tightness, and digit MP, PIP, and DIP extensor tightness. Pt. continues to benefit from working on impoving ROM in order to work towards increasing engagement of her bilateral hands during ADLs, and IADL tasks. Plan for 10th visit progress report, and FOTO assessment next visit.     ?  ?  ?  ? ? ? ? ? ? ? ? ? ? ? ? ? ? ? ? ? ? ? ? ? OT  Education - 04/15/21 1552   ? ? Education Details BUE functioning, and strengtheing   ? Person(s) Educated Associate Professor)   ? Methods Explanation;Demonstration;Verbal cues   ? Comprehension Verbalized understanding;Need further instruction;Verbal cues required   ? ?  ?  ? ?  ? ? ? ? ? ? OT Long Term Goals - 04/13/21 1113   ? ?  ? OT LONG TERM GOAL #1  ? Title Patient and caregiver will demonstrate understanding of home exercise program for ROM.   ? Baseline Pt. has recently been in quarantine for COVID-19. Pt.'s current restorative aide is retiring at the end of December. Pt. continues to have a restorative aide rehab aide assist her wih exercises at [facility]. 8/8: Independently completes/directs HEP mulitple times/week. 12/17/2020: Pt. has a new restorative aide who will be resuming ROM program.   ? Time 12   ? Period Weeks   ? Status On-going   ?  ? OT LONG TERM GOAL #4  ? Title Patient will demonstrate improved composite finger flexion to hold deodorant to apply to underarms.   ? Baseline 01/12/2021:  Pt. is progressing, and is able to reach to grasp, and hold narrow cones. 10/20/2020: Pt. conitnues to make steady progress with bilateral digit flexion.Pt. continues to present with stiifness/tightness which is hindering pt.'s ability to formulate a full composite fist. Pt. continues to present with improving finger flexion of R hand for a partial gross grasp, but unable to grasp and hold items. pt. conitnues to present with limited digit flexion of L hand. Pt. has active left thumb abduction. 8/8: holds cup with 2 hands, continues to have limited composite finger flexion L worse than R. 02/16/2021: pt. continues to present with limited bilateral gross composite fisting. 04/08/2021: pt. continues to present with limited digit flexion affecting her ability to formulate bilateral composite fisting. 110th visivt: Pt. is able to hold the utensils in preparation for self-feeding.   ? Time 12   ? Period Weeks   ?  Status On-going   ? Target Date 07/01/21   ?  ? OT LONG TERM GOAL #6  ? Title Patient will increase right UE ROM to comb the back of her hair with modified independence.   ? Baseline 01/12/2022: Pt. is able to comb her hair, reaching to the back of her hair.. 10/20/2020: Pt. continues to progress wtith RRUE ROM, and is able to use a pic for the back of her head, however is unable to brush the back of her hair. 07/30/2020: Pt. is now able to comb the back of her hair with a pic, however continues to work on completing the task efficiently. 8/8: Achieves with great effort, has assistance for efficiancy. 02/16/2021: Pt. is now able to reach, and brush the back of her hair. 04/08/2021: pt. continues to improve reach towards  the back of her hair. 110th visit: Pt. is to reach towards the back of her hair, however has difficulty sustaining her arms in elevaton for the entire duration of perfroming hair cre thoroughly.   ? Time 12   ? Period Weeks   ? Status Partially Met   ? Target Date 07/01/21   ?  ? OT LONG TERM GOAL #8  ? Title Pt. will write, and sign her name with 100% legibility, and modified independence.   ? Baseline 01/12/2021: Pt. continnues to fill out her daily menu and complete puzzles. 10/20/2020: Pt. continues to make progress overall. Pt. continues to consistently fill out her daily menu, and writing for crossword puzzles. Pt. is able to maintain grasp on a wide width pen. Contineus to work on [employer]. 8/8: writes daily, continues to work on improving speed. 02/16/2021: Pt. continues to present wiht limited writing legibility, fluctuating daily. 04/08/2021: Pt. continues to to be able to fill out her daily menu.limited writing legibility for daily puzzles.110th visit: Pt. continues to present with limited legibility, and efficiency for completing daily puzzles.   ? Time 12   ? Period Weeks   ? Status Partially Met   ? Target Date 07/01/21   ? ?  ?  ? ?  ? ? ? ? ? ? ? ? Plan - 04/15/21 1552   ? ?  Clinical Impression Statement Pt. is making progress with her bilateral UE's. Pt. was able to manipulate the washers with the right hand by sliding them up the side of the dish. Pt. had more difficulty grasp

## 2021-04-20 ENCOUNTER — Encounter: Payer: Self-pay | Admitting: Occupational Therapy

## 2021-04-20 ENCOUNTER — Ambulatory Visit: Payer: Medicare HMO | Admitting: Occupational Therapy

## 2021-04-20 ENCOUNTER — Other Ambulatory Visit: Payer: Self-pay

## 2021-04-20 ENCOUNTER — Ambulatory Visit: Payer: Medicare HMO

## 2021-04-20 DIAGNOSIS — M6281 Muscle weakness (generalized): Secondary | ICD-10-CM | POA: Diagnosis not present

## 2021-04-20 DIAGNOSIS — R262 Difficulty in walking, not elsewhere classified: Secondary | ICD-10-CM

## 2021-04-20 DIAGNOSIS — R278 Other lack of coordination: Secondary | ICD-10-CM

## 2021-04-20 DIAGNOSIS — R2689 Other abnormalities of gait and mobility: Secondary | ICD-10-CM

## 2021-04-20 NOTE — Therapy (Signed)
[place] [facility] ?[place], [place], [address] ?Phone: [phone]   Fax:  [phone] ? ?Occupational Therapy Treatment ? ?Patient Details  ?Name: Sherry [name] ?MRN: [id number] ?Date of Birth: 1934/07/11 ?No data recorded ? ?Encounter Date: 04/20/2021 ? ? OT End of Session - 04/20/21 1306   ? ? Visit Number [id number]   ? Number of Visits 154   ? Date for OT Re-Evaluation 07/01/21   ? Authorization Time Period Progress report period starting 11/26/2020   ? OT Start Time 1103   ? OT Stop Time 1145   ? OT Time Calculation (min) 42 min   ? Activity Tolerance Patient tolerated treatment well   ? Behavior During Therapy [facility] for tasks assessed/performed   ? ?  ?  ? ?  ? ? ?Past Medical History:  ?Diagnosis Date  ? Acute blood loss anemia   ? Arthritis   ? Cancer [facility])   ? skin  ? Central cord syndrome at C6 level of cervical spinal cord ([place]) 11/29/2017  ? Hypertension   ? Protein-calorie malnutrition, severe ([place]) 01/24/2018  ? S/P insertion of IVC (inferior vena caval) filter 01/24/2018  ? Tetraparesis ([place])   ? ? ?Past Surgical History:  ?Procedure Laterality Date  ? ANTERIOR CERVICAL DECOMP/DISCECTOMY FUSION N/A 11/29/2017  ? Procedure: Cervical five-six, six-seven Anterior Cervical Decompression Fusion;  Surgeon: Judith Part, MD;  Location: [place];  Service: Neurosurgery;  Laterality: N/A;  Cervical five-six, six-seven Anterior Cervical Decompression Fusion  ? CATARACT EXTRACTION    ? IR IVC FILTER PLMT / S&I /IMG GUID/MOD SED  12/08/2017  ? ? ?There were no vitals filed for this visit. ? ? Subjective Assessment - 04/20/21 1305   ? ? Subjective  Pt.'s daughter accompanied the pt. to therapy today.   ? Patient is accompanied by: Family member   ? Pertinent History Patient s/p fall November 29, 2017 resulting in diagnosis of central cord syndrome at C 6 level.  She has had therapy in multiple venues but with recent move to [facility], therapy staff  recommended she seek outpatient therapy for her needs.   ? Currently in Pain? No/denies   ? ?  ?  ? ?  ?OT TREATMENT   ?  ?Pt. tolerated PROM bilateral wrist extension, digit MP, PIP, and DIP flexion, and extension, thumb radial, and palmar abduction in conjunction with moist heat. Emphasis was placed on left wrist extension, bilateral thumb radial, and palmar abduction, and bilateral digit MP, PIP, and DIP flexion. Pt. worked on [employer], and reciprocal motion using the UBE while sitting for 8 min. with minimal resistance.  ?  ? ?Pt. continues to present with bilateral wrist, and digit tightness, and pt. continues to work towards improving with reaching with her BUEs in preparation for improving overhead ADL tasks including: grooming/haircare, reaching, and writing. Pt. was able to tolerate the UBE without difficulty with increasing resistance. Pt. tolerated ROM well without reports of pain, or discomfort following moist heat modality. Pt. continues to present with bilateral wrist flexor tightness, and digit MP, PIP, and DIP extensor tightness. Pt. continues to benefit from working on impoving ROM in order to work towards increasing engagement of her bilateral hands during ADLs, and IADL tasks.    ?  ? ? ? ? ? ? ? ? ? ? ? ? ? ? ? ? ? ? ? ? ? ? OT Education - 04/20/21 1306   ? ? Education  Details BUE functioning, and strengtheing   ? Person(s) Educated Associate Professor)   ? Methods Explanation;Demonstration;Verbal cues   ? Comprehension Verbalized understanding;Need further instruction;Verbal cues required   ? ?  ?  ? ?  ? ? ? ? ? ? OT Long Term Goals - 04/13/21 1113   ? ?  ? OT LONG TERM GOAL #1  ? Title Patient and caregiver will demonstrate understanding of home exercise program for ROM.   ? Baseline Pt. has recently been in quarantine for COVID-19. Pt.'s current restorative aide is retiring at the end of December. Pt. continues to have a restorative aide rehab aide assist her wih exercises at [facility]. 8/8: Independently completes/directs HEP mulitple times/week. 12/17/2020: Pt. has a new restorative aide who will be resuming ROM program.   ? Time 12   ? Period Weeks   ? Status On-going   ?  ? OT LONG TERM GOAL #4  ? Title Patient will demonstrate improved composite finger flexion to hold deodorant to apply to underarms.   ? Baseline 01/12/2021:  Pt. is progressing, and is able to reach to grasp, and hold narrow cones. 10/20/2020: Pt. conitnues to make steady progress with bilateral digit flexion.Pt. continues to present with stiifness/tightness which is hindering pt.'s ability to formulate a full composite fist. Pt. continues to present with improving finger flexion of R hand for a partial gross grasp, but unable to grasp and hold items. pt. conitnues to present with limited digit flexion of L hand. Pt. has active left thumb abduction. 8/8: holds cup with 2 hands, continues to have limited composite finger flexion L worse than R. 02/16/2021: pt. continues to present with limited bilateral gross composite fisting. 04/08/2021: pt. continues to present with limited digit flexion affecting her ability to formulate bilateral composite fisting. 110th visivt: Pt. is able to hold the utensils in preparation for self-feeding.   ? Time 12   ? Period Weeks   ? Status On-going   ? Target Date 07/01/21   ?  ? OT LONG TERM GOAL #6  ? Title Patient will increase right UE ROM to comb the back of her hair with modified independence.   ? Baseline 01/12/2022: Pt. is able to comb her hair, reaching to the back of her hair.. 10/20/2020: Pt. continues to progress wtith RRUE ROM, and is able to use a pic for the back of her head, however is unable to brush the back of her hair. 07/30/2020: Pt. is now able to comb the back of her hair with a pic, however continues to work on completing the task efficiently. 8/8: Achieves with great effort, has assistance for efficiancy. 02/16/2021: Pt. is now able to reach, and brush the back of her  hair. 04/08/2021: pt. continues to improve reach towards the back of her hair. 110th visit: Pt. is to reach towards the back of her hair, however has difficulty sustaining her arms in elevaton for the entire duration of perfroming hair cre thoroughly.   ? Time 12   ? Period Weeks   ? Status Partially Met   ? Target Date 07/01/21   ?  ? OT LONG TERM GOAL #8  ? Title Pt. will write, and sign her name with 100% legibility, and modified independence.   ? Baseline 01/12/2021: Pt. continnues to fill out her daily menu and complete puzzles. 10/20/2020: Pt. continues to make progress overall. Pt. continues to consistently fill out her daily menu, and writing for crossword puzzles. Pt. is able to  maintain grasp on a wide width pen. Contineus to work on [employer]. 8/8: writes daily, continues to work on improving speed. 02/16/2021: Pt. continues to present wiht limited writing legibility, fluctuating daily. 04/08/2021: Pt. continues to to be able to fill out her daily menu.limited writing legibility for daily puzzles.110th visit: Pt. continues to present with limited legibility, and efficiency for completing daily puzzles.   ? Time 12   ? Period Weeks   ? Status Partially Met   ? Target Date 07/01/21   ? ?  ?  ? ?  ? ? ? ? ? ? ? ? Plan - 04/20/21 1307   ? ? Clinical Impression Statement Pt. continues to present with bilateral wrist, and digit tightness, and pt. continues to work towards improving with reaching with her BUEs in preparation for improving overhead ADL tasks including: grooming/haircare, reaching, and writing. Pt. was able to tolerate the UBE without difficulty with increasing resistance. Pt. tolerated ROM well without reports of pain, or discomfort following moist heat modality. Pt. continues to present with bilateral wrist flexor tightness, and digit MP, PIP, and DIP extensor tightness. Pt. continues to benefit from working on impoving ROM in order to work towards increasing engagement of her bilateral  hands during ADLs, and IADL tasks.    ?  ?  ? OT Occupational Profile and History Detailed Assessment- Review of Records and additional review of physical, cognitive, psychosocial history related to current func

## 2021-04-20 NOTE — Therapy (Signed)
[place] [facility] ?[place], [place], [address] ?Phone: [phone]   Fax:  [phone] ? ?Physical Therapy Treatment ? ?Patient Details  ?Name: Sherry [name] ?MRN: [id number] ?Date of Birth: [birth year] ?No data recorded ? ?Encounter Date: 04/20/2021 ? ? PT End of Session - 04/20/21 1150   ? ? Visit Number 135   ? Number of Visits 136   ? Date for PT Re-Evaluation 06/22/21   ? Authorization Type [employer] FOTO performed by PT on eval (7/20), score 12, Progress note on 03/10/2020; PN on 08/20/2020   ? Authorization Time Period Recert 03/08/5807- 9/83/3825; PN on 0/06/3974; Recert 7/34/1937- 90/24/0973; PN on 5/32/9924; Recert 26/83/4196- 03/25/9796; Recert 10/23/1939-7/40/8144   ? PT Start Time 1145   ? PT Stop Time 1228   ? PT Time Calculation (min) 43 min   ? Equipment Utilized During Treatment Gait belt   ? Activity Tolerance Patient tolerated treatment well;Patient limited by fatigue   ? Behavior During Therapy [facility] for tasks assessed/performed   ? ?  ?  ? ?  ? ? ?Past Medical History:  ?Diagnosis Date  ? Acute blood loss anemia   ? Arthritis   ? Cancer [facility])   ? skin  ? Central cord syndrome at C6 level of cervical spinal cord ([place]) 11/29/2017  ? Hypertension   ? Protein-calorie malnutrition, severe ([place]) 01/24/2018  ? S/P insertion of IVC (inferior vena caval) filter 01/24/2018  ? Tetraparesis ([place])   ? ? ?Past Surgical History:  ?Procedure Laterality Date  ? ANTERIOR CERVICAL DECOMP/DISCECTOMY FUSION N/A 11/29/2017  ? Procedure: Cervical five-six, six-seven Anterior Cervical Decompression Fusion;  Surgeon: Judith Part, MD;  Location: [place];  Service: Neurosurgery;  Laterality: N/A;  Cervical five-six, six-seven Anterior Cervical Decompression Fusion  ? CATARACT EXTRACTION    ? IR IVC FILTER PLMT / S&I /IMG GUID/MOD SED  12/08/2017  ? ? ?There were no vitals filed for this visit. ? ? Subjective Assessment - 04/20/21 1148   ? ? Subjective  Patient reports doing well overall- Denies any pain and reports willing to try standing again today.   ? Pertinent History Pt is an 87 yo female that fell in 2019, fractured vertebrae in her neck and in her low back per family. 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 After hospital stay, pt discharged to [facility] for ~1 month, experienced severe UTI as well as bilateral DVT (IVC filter placed). Discharged to [facility], stayed for about a year, and then moved to [facility] in April 2021. Was receiving PT, but per family facility reported that did not have adequate equipment to maximize PT for her. Pt until about 1 month ago was practicing sit to stand transfers with 1-2 people, and working on static standing. Has a brace for L foot due to PF. Pt currently needs assistance with all ADLs (able to assist with donning/doffing her shirt), bed baths, and needs a hoyer lift for transfers. Able to drive power wheelchair without assistance.   ? Currently in Pain? No/denies   ? ?  ?  ? ?  ? ? ?INTERVENTIONS:  ? ? ?Therapeutic Activities:  ? ?Sit to stand x 5 attempts below- each with mod A (VC for foot placement)  ? ?Static stand - 3 min 25 sec (much improved ability to stand erect with more ant weight shift today)  ? ?Lateral weight shift - Static stand with dynamic lateral weight shift - left to right  x approx 25 rep then remained standing for approx 3 min total.  ? ?Lateral weight shift and attempt to move each leg x 2 trials- Patient was able minimally move his right LE yet unable to clear foot  ? ? ?Therapeutic exercises:  ? ?Seated hip AROM hip flex BLE x 15 reps  ?Seated hip AAROM hip flex BLE x 15 reps ? ?Education provided throughout session via VC/TC and demonstration to facilitate movement at target joints and correct muscle activation for all testing and exercises performed.  ? ? ? ? ? ? ? ? ? ? ? ? ? ? ? ? ? ? ? ? ? ? ? ? ? ? ? ? ? ? PT Education - 04/20/21 1149   ? ? Education Details Exercise  technique.   ? Person(s) Educated Patient   ? Methods Explanation;Demonstration;Tactile cues;Verbal cues   ? Comprehension Verbalized understanding;Returned demonstration;Verbal cues required;Tactile cues required;Need further instruction   ? ?  ?  ? ?  ? ? ? PT Short Term Goals - 03/10/20 2211   ? ?  ? PT SHORT TERM GOAL #1  ? Title The patient will perform initial HEP with minimum assistance in order to improve strength and function.   ? Baseline Patient demonstrating independence with initial HEP as of 03/10/2020 with no questions or difficulty.   ? Time 4   ? Period Weeks   ? Status Achieved   ? Target Date 02/11/20   ? ?  ?  ? ?  ? ? ? ? PT Long Term Goals - 03/30/21 1150   ? ?  ? PT LONG TERM GOAL #1  ? Title The patient will be compliant with finalized HEP with minimum assistance in preparation for self management and maintenance of condition.   ? Baseline 03/10/2020- Patient familiar with initial HEP and will keep goal active as HEP becomes more progressive including possible transfers and standing activities. 04/07/2020- Patient continues to report compliance with LE strengthening home exercises without questions or concerns. 04/21/2020- Patient able to verbalize and demonstrate good understanding of current HEP including some supine and seated LE strengthening exercises. Reviewed and patient performed 10 reps today with minimal cueing. Will keep goal active to progress HEP as appropriate. 05/28/2020- patient reports continues to perform LE strengthening as able and some help from caregiver as able. No changes at this time to home program as patient is limited to supine/seated therex. 07/02/2020- Patient continues to report compliance with current supine and seated LE strengtheing home program and states no questions or concerns regarding her plan.   ? Time 12   ? Period Weeks   ? Status Achieved   ?  ? PT LONG TERM GOAL #2  ? Title The patient will demonstrate at least 10 point improvement on FOTO score  indicating an improved ability to perform functional activities.   ? Baseline on eval (/720) score 12; 10/22/19: 12; 12/10/19: 18, 12/13: 18: 04/07/2020- Will assess next visit. 07/02/2020- Will obtain next visit. 09/24/2020= Will obtain next visit. 11/12/2020= 12; 12/22/2020= 12; 03/30/2021= 17   ? Time 12   ? Period Weeks   ? Status On-going   ? Target Date 06/22/21   ?  ? PT LONG TERM GOAL #3  ? Title The patient will demonstrate lateral scooting  for assistance with transfers with minA to maximize independence.   ? Baseline 10/22/19: Pt requires modA+1 for lateral scooting; 12/10/19: modA+1, 12/13: modA-maxA, 12/27: modA-maxA, 03/10/2020- ModA-MaxA- increased verbal cues and visual  demonstration. 04/07/2020- Continued Max assist to perform forward/lateral scoot. 04/21/2020- Patient was able to demo slight lateral scoot with CGA approx 12 in then fatigued requiring max assist- she is able to scoot forward by leaning without significant issues. 05/28/2020- While sitting in chair- patient able to perform forward and lateral scoot with VC/visual demo and increased time allowed- CGA today but not consistent yet- will keep goal active at this time to continue to focus on strengthening and improving technique. 07/02/2020- Patient able to demonstrate minimal ability to laterally shift weigh on mat table requiring max assist yet is able to forward scoot out to edge of mat table with min Assist. 08/20/2020 - Patient was able to demonstrate minimal lateral scooting at edge of mat while holding feet still so they did not slip on tile surface. She continues to be able to demonstrate scoot forward and uses tilt option to recline to position safely back into seat of power wheelchair. 09/25/2020- Patient able to sit on mat and minimally scoot laterally but requires min/mod A for efficient scooting. Will end goal at this time due to plateau in progress with scooting over extended time.   ? Time 12   ? Period Weeks   ? Status Not Met   ?  ? PT  LONG TERM GOAL #4  ? Title The patient will demonstrate a squat pivot transfer with minimum assistance to maximize independence and mobility.   ? Baseline 10/22/19: unable to safely attempt at this time; 12/10/19: unab

## 2021-04-21 DIAGNOSIS — I82729 Chronic embolism and thrombosis of deep veins of unspecified upper extremity: Secondary | ICD-10-CM

## 2021-04-21 DIAGNOSIS — F39 Unspecified mood [affective] disorder: Secondary | ICD-10-CM | POA: Diagnosis not present

## 2021-04-21 DIAGNOSIS — G8220 Paraplegia, unspecified: Secondary | ICD-10-CM | POA: Diagnosis not present

## 2021-04-21 DIAGNOSIS — M810 Age-related osteoporosis without current pathological fracture: Secondary | ICD-10-CM

## 2021-04-21 DIAGNOSIS — K592 Neurogenic bowel, not elsewhere classified: Secondary | ICD-10-CM | POA: Diagnosis not present

## 2021-04-21 DIAGNOSIS — I10 Essential (primary) hypertension: Secondary | ICD-10-CM | POA: Diagnosis not present

## 2021-04-22 ENCOUNTER — Ambulatory Visit: Payer: Medicare HMO

## 2021-04-22 ENCOUNTER — Other Ambulatory Visit: Payer: Self-pay

## 2021-04-22 ENCOUNTER — Encounter: Payer: Self-pay | Admitting: Occupational Therapy

## 2021-04-22 ENCOUNTER — Ambulatory Visit: Payer: Medicare HMO | Admitting: Occupational Therapy

## 2021-04-22 DIAGNOSIS — M6281 Muscle weakness (generalized): Secondary | ICD-10-CM | POA: Diagnosis not present

## 2021-04-22 DIAGNOSIS — R278 Other lack of coordination: Secondary | ICD-10-CM

## 2021-04-22 DIAGNOSIS — R262 Difficulty in walking, not elsewhere classified: Secondary | ICD-10-CM

## 2021-04-22 DIAGNOSIS — R2689 Other abnormalities of gait and mobility: Secondary | ICD-10-CM

## 2021-04-22 DIAGNOSIS — R2681 Unsteadiness on feet: Secondary | ICD-10-CM

## 2021-04-22 NOTE — Therapy (Signed)
[place] [facility] ?[place], [place], [address] ?Phone: [phone]   Fax:  [phone] ? ?Occupational Therapy Treatment ? ?Patient Details  ?Name: Sherry [name] ?MRN: [id number] ?Date of Birth: 1935-01-07 ?No data recorded ? ?Encounter Date: 04/22/2021 ? ? OT End of Session - 04/22/21 1209   ? ? Visit Number 113   ? Number of Visits 154   ? Date for OT Re-Evaluation 07/01/21   ? Authorization Time Period Progress report period starting 11/26/2020   ? OT Start Time 1100   ? OT Stop Time 1145   ? OT Time Calculation (min) 45 min   ? Activity Tolerance Patient tolerated treatment well   ? Behavior During Therapy [facility] for tasks assessed/performed   ? ?  ?  ? ?  ? ? ?Past Medical History:  ?Diagnosis Date  ? Acute blood loss anemia   ? Arthritis   ? Cancer [facility])   ? skin  ? Central cord syndrome at C6 level of cervical spinal cord ([place]) 11/29/2017  ? Hypertension   ? Protein-calorie malnutrition, severe ([place]) 01/24/2018  ? S/P insertion of IVC (inferior vena caval) filter 01/24/2018  ? Tetraparesis ([place])   ? ? ?Past Surgical History:  ?Procedure Laterality Date  ? ANTERIOR CERVICAL DECOMP/DISCECTOMY FUSION N/A 11/29/2017  ? Procedure: Cervical five-six, six-seven Anterior Cervical Decompression Fusion;  Surgeon: Judith Part, MD;  Location: [facility];  Service: Neurosurgery;  Laterality: N/A;  Cervical five-six, six-seven Anterior Cervical Decompression Fusion  ? CATARACT EXTRACTION    ? IR IVC FILTER PLMT / S&I /IMG GUID/MOD SED  12/08/2017  ? ? ?There were no vitals filed for this visit. ? ? Subjective Assessment - 04/22/21 1208   ? ? Subjective  Pt. reports doing well today.   ? Patient is accompanied by: Family member   ? Pertinent History Patient s/p fall November 29, 2017 resulting in diagnosis of central cord syndrome at C 6 level.  She has had therapy in multiple venues but with recent move to [facility], therapy staff recommended she seek  outpatient therapy for her needs.   ? Currently in Pain? No/denies   ? ?  ?  ? ?  ? ?OT TREATMENT   ? ?Neuromuscular re-education: ? ?Pt. worked on bilateral [facility] skills grasping small 1" resistive cubes using her thumb and 2nd digit. Pt. worked on removing the pegs, then reaching across midline to her contralateral side to place them. Pt. then worked on retrieving them from the ipsilateral side, and placing them onto the cube board. Pt. Worked on isolating her 2nd digit to press them more securely into place. ? ?Therapeutic Ex. ?  ?Pt. tolerated PROM bilateral wrist extension, digit MP, PIP, and DIP flexion, and extension, thumb radial, and palmar abduction in conjunction with moist heat. Emphasis was placed on left wrist extension, bilateral thumb radial, and palmar abduction, and bilateral digit MP, PIP, and DIP flexion. Pt. worked on [employer], and reciprocal motion using the UBE while sitting for 8 min. with minimal resistance.  ?  ?Pt. continues to present with bilateral wrist, and digit tightness, and continues to work towards improving with reaching across midline. Pt. is initiating more attempts with [facility] skills, and grasping, however continues to require increased work to improve grasping patterns with both the right, and left hands. Pt. was able to tolerate the UBE without difficulty with increasing resistance. Pt. tolerated ROM well without reports of pain, or discomfort following  moist heat modality. Pt. continues to present with bilateral wrist flexor tightness, and digit MP, PIP, and DIP extensor tightness. Pt. continues to benefit from working on impoving ROM in order to work towards increasing engagement of her bilateral hands during ADLs, and IADL tasks.  ?  ?  ?  ?  ? ? ?  ? ? ? ? ? ? ? ? ? ? ? ? ? ? ? ? ? ? ? ? ? OT Education - 04/22/21 1209   ? ? Education Details BUE functioning, and strengtheing   ? Person(s) Educated Associate Professor)   ? Methods Explanation;Demonstration;Verbal cues    ? Comprehension Verbalized understanding;Need further instruction;Verbal cues required   ? ?  ?  ? ?  ? ? ? ? ? ? OT Long Term Goals - 04/13/21 1113   ? ?  ? OT LONG TERM GOAL #1  ? Title Patient and caregiver will demonstrate understanding of home exercise program for ROM.   ? Baseline Pt. has recently been in quarantine for COVID-19. Pt.'s current restorative aide is retiring at the end of December. Pt. continues to have a restorative aide rehab aide assist her wih exercises at [facility]. 8/8: Independently completes/directs HEP mulitple times/week. 12/17/2020: Pt. has a new restorative aide who will be resuming ROM program.   ? Time 12   ? Period Weeks   ? Status On-going   ?  ? OT LONG TERM GOAL #4  ? Title Patient will demonstrate improved composite finger flexion to hold deodorant to apply to underarms.   ? Baseline 01/12/2021:  Pt. is progressing, and is able to reach to grasp, and hold narrow cones. 10/20/2020: Pt. conitnues to make steady progress with bilateral digit flexion.Pt. continues to present with stiifness/tightness which is hindering pt.'s ability to formulate a full composite fist. Pt. continues to present with improving finger flexion of R hand for a partial gross grasp, but unable to grasp and hold items. pt. conitnues to present with limited digit flexion of L hand. Pt. has active left thumb abduction. 8/8: holds cup with 2 hands, continues to have limited composite finger flexion L worse than R. 02/16/2021: pt. continues to present with limited bilateral gross composite fisting. 04/08/2021: pt. continues to present with limited digit flexion affecting her ability to formulate bilateral composite fisting. 110th visivt: Pt. is able to hold the utensils in preparation for self-feeding.   ? Time 12   ? Period Weeks   ? Status On-going   ? Target Date 07/01/21   ?  ? OT LONG TERM GOAL #6  ? Title Patient will increase right UE ROM to comb the back of her hair with modified independence.   ?  Baseline 01/12/2022: Pt. is able to comb her hair, reaching to the back of her hair.. 10/20/2020: Pt. continues to progress wtith RRUE ROM, and is able to use a pic for the back of her head, however is unable to brush the back of her hair. 07/30/2020: Pt. is now able to comb the back of her hair with a pic, however continues to work on completing the task efficiently. 8/8: Achieves with great effort, has assistance for efficiancy. 02/16/2021: Pt. is now able to reach, and brush the back of her hair. 04/08/2021: pt. continues to improve reach towards the back of her hair. 110th visit: Pt. is to reach towards the back of her hair, however has difficulty sustaining her arms in elevaton for the entire duration of perfroming hair cre thoroughly.   ?  Time 12   ? Period Weeks   ? Status Partially Met   ? Target Date 07/01/21   ?  ? OT LONG TERM GOAL #8  ? Title Pt. will write, and sign her name with 100% legibility, and modified independence.   ? Baseline 01/12/2021: Pt. continnues to fill out her daily menu and complete puzzles. 10/20/2020: Pt. continues to make progress overall. Pt. continues to consistently fill out her daily menu, and writing for crossword puzzles. Pt. is able to maintain grasp on a wide width pen. Contineus to work on [employer]. 8/8: writes daily, continues to work on improving speed. 02/16/2021: Pt. continues to present wiht limited writing legibility, fluctuating daily. 04/08/2021: Pt. continues to to be able to fill out her daily menu.limited writing legibility for daily puzzles.110th visit: Pt. continues to present with limited legibility, and efficiency for completing daily puzzles.   ? Time 12   ? Period Weeks   ? Status Partially Met   ? Target Date 07/01/21   ? ?  ?  ? ?  ? ? ? ? ? ? ? ? Plan - 04/22/21 1210   ? ? Clinical Impression Statement Pt. continues to present with bilateral wrist, and digit tightness, and continues to work towards improving with reaching across midline. Pt. is  initiating more attempts with [facility] skills, and grasping, however continues to require increased work to improve grasping patterns with both the right, and left hands. Pt. was able to tolerate the UBE without difficult

## 2021-04-22 NOTE — Therapy (Signed)
[place] [facility] ?[place], [place], [address] ?Phone: [phone]   Fax:  [phone] ? ?Physical Therapy Treatment ? ?Patient Details  ?Name: Sherry [name] ?MRN: [id number] ?Date of Birth: 03/05/1934 ?No data recorded ? ?Encounter Date: 04/22/2021 ? ? PT End of Session - 04/22/21 1126   ? ? Visit Number 136   ? Number of Visits 153   ? Date for PT Re-Evaluation 06/22/21   ? Authorization Type [employer] FOTO performed by PT on eval (7/20), score 12, Progress note on 03/10/2020; PN on 08/20/2020   ? Authorization Time Period Recert 08/03/9019- 02/15/5206; PN on 0/03/2334; Recert 02/23/4495- 53/00/5110; PN on 03/14/1733; Recert 67/02/4101- 0/13/1438; Recert 8/87/5797-2/82/0601   ? PT Start Time 1145   ? PT Stop Time 1228   ? PT Time Calculation (min) 43 min   ? Equipment Utilized During Treatment Gait belt   ? Activity Tolerance Patient tolerated treatment well;Patient limited by fatigue   ? Behavior During Therapy [facility] for tasks assessed/performed   ? ?  ?  ? ?  ? ? ?Past Medical History:  ?Diagnosis Date  ? Acute blood loss anemia   ? Arthritis   ? Cancer [facility])   ? skin  ? Central cord syndrome at C6 level of cervical spinal cord ([place]) 11/29/2017  ? Hypertension   ? Protein-calorie malnutrition, severe ([place]) 01/24/2018  ? S/P insertion of IVC (inferior vena caval) filter 01/24/2018  ? Tetraparesis ([place])   ? ? ?Past Surgical History:  ?Procedure Laterality Date  ? ANTERIOR CERVICAL DECOMP/DISCECTOMY FUSION N/A 11/29/2017  ? Procedure: Cervical five-six, six-seven Anterior Cervical Decompression Fusion;  Surgeon: Judith Part, MD;  Location: [place];  Service: Neurosurgery;  Laterality: N/A;  Cervical five-six, six-seven Anterior Cervical Decompression Fusion  ? CATARACT EXTRACTION    ? IR IVC FILTER PLMT / S&I /IMG GUID/MOD SED  12/08/2017  ? ? ?There were no vitals filed for this visit. ? ? Subjective Assessment - 04/22/21 1125   ? ? Pertinent  History Pt is an 87 yo female that fell in 2019, fractured vertebrae in her neck and in her low back per family. 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 After hospital stay, pt discharged to [facility] for ~1 month, experienced severe UTI as well as bilateral DVT (IVC filter placed). Discharged to [facility], stayed for about a year, and then moved to [facility] in April 2021. Was receiving PT, but per family facility reported that did not have adequate equipment to maximize PT for her. Pt until about 1 month ago was practicing sit to stand transfers with 1-2 people, and working on static standing. Has a brace for L foot due to PF. Pt currently needs assistance with all ADLs (able to assist with donning/doffing her shirt), bed baths, and needs a hoyer lift for transfers. Able to drive power wheelchair without assistance.   ? ?  ?  ? ?  ? ? ? ?INTERVENTIONS:  ? ? ?Therapeutic activities:  ? ?SPT- Sit to stand with mod assist then transfer to mat table going to patient's left side today. She was able to perform with max assist holding onto PT arms. She was able to slightly move left LE yet rather than pivot- kicked left LE out in front requiring more assist to sit safely.  ?Patient perform SPT back to her Power wheelchair and then performed 2 more trials - all with similar ability- minimal ability to move feet  during pivot and Max assist.  ? ?Attempted standing at // bars (From side with 2 person assist allowing patient to pull up from bars- She required mod assist of 2 exhibiting increased difficulty with B knee buckling. She performed 3 more trials - improving with knee control each trial. ? ?She was also performing static stand at bars during these trials ranging from 15 sec to 1 min 12 sec today.  ? ?Education provided throughout session via VC/TC and demonstration to facilitate movement at target joints and correct muscle activation for all testing and exercises performed.   ? ? ? ? ? ? ? ? ? ? ? ? ? ? ? ? ? ? ? ? ? ? ? ? ? ? PT Short Term Goals - 03/10/20 2211   ? ?  ? PT SHORT TERM GOAL #1  ? Title The patient will perform initial HEP with minimum assistance in order to improve strength and function.   ? Baseline Patient demonstrating independence with initial HEP as of 03/10/2020 with no questions or difficulty.   ? Time 4   ? Period Weeks   ? Status Achieved   ? Target Date 02/11/20   ? ?  ?  ? ?  ? ? ? ? PT Long Term Goals - 03/30/21 1150   ? ?  ? PT LONG TERM GOAL #1  ? Title The patient will be compliant with finalized HEP with minimum assistance in preparation for self management and maintenance of condition.   ? Baseline 03/10/2020- Patient familiar with initial HEP and will keep goal active as HEP becomes more progressive including possible transfers and standing activities. 04/07/2020- Patient continues to report compliance with LE strengthening home exercises without questions or concerns. 04/21/2020- Patient able to verbalize and demonstrate good understanding of current HEP including some supine and seated LE strengthening exercises. Reviewed and patient performed 10 reps today with minimal cueing. Will keep goal active to progress HEP as appropriate. 05/28/2020- patient reports continues to perform LE strengthening as able and some help from caregiver as able. No changes at this time to home program as patient is limited to supine/seated therex. 07/02/2020- Patient continues to report compliance with current supine and seated LE strengtheing home program and states no questions or concerns regarding her plan.   ? Time 12   ? Period Weeks   ? Status Achieved   ?  ? PT LONG TERM GOAL #2  ? Title The patient will demonstrate at least 10 point improvement on FOTO score indicating an improved ability to perform functional activities.   ? Baseline on eval (/720) score 12; 10/22/19: 12; 12/10/19: 18, 12/13: 18: 04/07/2020- Will assess next visit. 07/02/2020- Will obtain next visit.  09/24/2020= Will obtain next visit. 11/12/2020= 12; 12/22/2020= 12; 03/30/2021= 17   ? Time 12   ? Period Weeks   ? Status On-going   ? Target Date 06/22/21   ?  ? PT LONG TERM GOAL #3  ? Title The patient will demonstrate lateral scooting  for assistance with transfers with minA to maximize independence.   ? Baseline 10/22/19: Pt requires modA+1 for lateral scooting; 12/10/19: modA+1, 12/13: modA-maxA, 12/27: modA-maxA, 03/10/2020- ModA-MaxA- increased verbal cues and visual demonstration. 04/07/2020- Continued Max assist to perform forward/lateral scoot. 04/21/2020- Patient was able to demo slight lateral scoot with CGA approx 12 in then fatigued requiring max assist- she is able to scoot forward by leaning without significant issues. 05/28/2020- While sitting in chair- patient able to perform  forward and lateral scoot with VC/visual demo and increased time allowed- CGA today but not consistent yet- will keep goal active at this time to continue to focus on strengthening and improving technique. 07/02/2020- Patient able to demonstrate minimal ability to laterally shift weigh on mat table requiring max assist yet is able to forward scoot out to edge of mat table with min Assist. 08/20/2020 - Patient was able to demonstrate minimal lateral scooting at edge of mat while holding feet still so they did not slip on tile surface. She continues to be able to demonstrate scoot forward and uses tilt option to recline to position safely back into seat of power wheelchair. 09/25/2020- Patient able to sit on mat and minimally scoot laterally but requires min/mod A for efficient scooting. Will end goal at this time due to plateau in progress with scooting over extended time.   ? Time 12   ? Period Weeks   ? Status Not Met   ?  ? PT LONG TERM GOAL #4  ? Title The patient will demonstrate a squat pivot transfer with minimum assistance to maximize independence and mobility.   ? Baseline 10/22/19: unable to safely attempt at this time; 12/10/19:  unable to safely attempt at this time, 01/14/20: unable to safety attempt at this time. 03/10/2020- Patient able to perform Stand pivot transfer with Maximal assist. 04/07/2020- Patient continues to require Max assist with sit to stand- w

## 2021-04-27 ENCOUNTER — Ambulatory Visit: Payer: Medicare HMO

## 2021-04-27 ENCOUNTER — Ambulatory Visit: Payer: Medicare HMO | Admitting: Occupational Therapy

## 2021-04-29 ENCOUNTER — Other Ambulatory Visit: Payer: Self-pay

## 2021-04-29 ENCOUNTER — Ambulatory Visit: Payer: Medicare HMO | Admitting: Occupational Therapy

## 2021-04-29 ENCOUNTER — Ambulatory Visit: Payer: Medicare HMO

## 2021-04-29 ENCOUNTER — Encounter: Payer: Self-pay | Admitting: Occupational Therapy

## 2021-04-29 DIAGNOSIS — R269 Unspecified abnormalities of gait and mobility: Secondary | ICD-10-CM

## 2021-04-29 DIAGNOSIS — R2681 Unsteadiness on feet: Secondary | ICD-10-CM

## 2021-04-29 DIAGNOSIS — R262 Difficulty in walking, not elsewhere classified: Secondary | ICD-10-CM

## 2021-04-29 DIAGNOSIS — M6281 Muscle weakness (generalized): Secondary | ICD-10-CM

## 2021-04-29 DIAGNOSIS — R278 Other lack of coordination: Secondary | ICD-10-CM

## 2021-04-29 DIAGNOSIS — R2689 Other abnormalities of gait and mobility: Secondary | ICD-10-CM

## 2021-04-29 NOTE — Therapy (Signed)
[facility] ?[place], [place], [address] ?Phone: [phone]   Fax:  [phone] ? ?Physical Therapy Treatment ? ?Patient Details  ?Name: Sherry [name] ?MRN: [id number] ?Date of Birth: 1934-08-07 ?No data recorded ? ?Encounter Date: 04/29/2021 ? ? PT End of Session - 04/29/21 1023   ? ? Visit Number 137   ? Number of Visits 153   ? Date for PT Re-Evaluation 06/22/21   ? Authorization Type [employer]na [employer] FOTO performed by PT on eval (7/20), score 12, Progress note on 03/10/2020; PN on 08/20/2020   ? Authorization Time Period Recert 02/10/6220- 9/79/8921; PN on 02/10/4172; Recert 0/81/4481- 85/63/1497; PN on 0/26/3785; Recert 88/50/2774- 02/28/7865; Recert 6/72/0947-0/96/2836   ? PT Start Time 1146   ? PT Stop Time 1228   ? PT Time Calculation (min) 42 min   ? Equipment Utilized During Treatment Gait belt   ? Activity Tolerance Patient tolerated treatment well;Patient limited by fatigue   ? Behavior During Therapy [facility] for tasks assessed/performed   ? ?  ?  ? ?  ? ? ?Past Medical History:  ?Diagnosis Date  ? Acute blood loss anemia   ? Arthritis   ? Cancer [facility])   ? skin  ? Central cord syndrome at C6 level of cervical spinal cord ([place]) 11/29/2017  ? Hypertension   ? Protein-calorie malnutrition, severe ([place]) 01/24/2018  ? S/P insertion of IVC (inferior vena caval) filter 01/24/2018  ? Tetraparesis ([place])   ? ? ?Past Surgical History:  ?Procedure Laterality Date  ? ANTERIOR CERVICAL DECOMP/DISCECTOMY FUSION N/A 11/29/2017  ? Procedure: Cervical five-six, six-seven Anterior Cervical Decompression Fusion;  Surgeon: Judith Part, MD;  Location: [facility];  Service: Neurosurgery;  Laterality: N/A;  Cervical five-six, six-seven Anterior Cervical Decompression Fusion  ? CATARACT EXTRACTION    ? IR IVC FILTER PLMT / S&I /IMG GUID/MOD SED  12/08/2017  ? ? ?There were no vitals filed for this visit. ? ? Subjective Assessment - 04/29/21 1202   ? ? Subjective  Patient reports doing well- Still needs to her chair serviced - "Think I have a short in the controls"   ? Pertinent History Pt is an 87 yo female that fell in 2019, fractured vertebrae in her neck and in her low back per family. 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 After hospital stay, pt discharged to [facility] for ~1 month, experienced severe UTI as well as bilateral DVT (IVC filter placed). Discharged to [facility], stayed for about a year, and then moved to [facility] in April 2021. Was receiving PT, but per family facility reported that did not have adequate equipment to maximize PT for her. Pt until about 1 month ago was practicing sit to stand transfers with 1-2 people, and working on static standing. Has a brace for L foot due to PF. Pt currently needs assistance with all ADLs (able to assist with donning/doffing her shirt), bed baths, and needs a hoyer lift for transfers. Able to drive power wheelchair without assistance.   ? ?  ?  ? ?  ? ? ? ? ?INTERVENTIONS: ? ?*Treatment limited to seated therex secondary to patient having technical difficulty with power wheelchair.  ? ?Therex:  ? ?Seated quad sets- hold 5 sec x 10 reps x 2 sets ?Seated AAROM hip march - x 20 reps x 2 sets ?Seated Knee ext- AAROM to achieve full Right LE ROM - and Active Left- x 15 reps x 2  sets  ? ?Seated hip abd (AAROM) 2 sets of 10 reps ?Seated Hip add (AROM) x 10 reps ?Seated ham curls using RTB 2 sets of 15 reps.  ? ?Education provided throughout session via VC/TC and demonstration to facilitate movement at target joints and correct muscle activation for all testing and exercises performed.  ? ? ? ? ? ? ? ? ? ? ? ? ? ? ? ? ? ? ? ? ? ? ? ? ? PT Education - 04/30/21 1023   ? ? Education Details Seated exercise technique   ? Person(s) Educated Patient   ? Methods Explanation;Demonstration;Tactile cues;Verbal cues   ? Comprehension Verbalized understanding;Returned demonstration;Verbal cues required;Tactile cues  required;Need further instruction   ? ?  ?  ? ?  ? ? ? PT Short Term Goals - 03/10/20 2211   ? ?  ? PT SHORT TERM GOAL #1  ? Title The patient will perform initial HEP with minimum assistance in order to improve strength and function.   ? Baseline Patient demonstrating independence with initial HEP as of 03/10/2020 with no questions or difficulty.   ? Time 4   ? Period Weeks   ? Status Achieved   ? Target Date 02/11/20   ? ?  ?  ? ?  ? ? ? ? PT Long Term Goals - 03/30/21 1150   ? ?  ? PT LONG TERM GOAL #1  ? Title The patient will be compliant with finalized HEP with minimum assistance in preparation for self management and maintenance of condition.   ? Baseline 03/10/2020- Patient familiar with initial HEP and will keep goal active as HEP becomes more progressive including possible transfers and standing activities. 04/07/2020- Patient continues to report compliance with LE strengthening home exercises without questions or concerns. 04/21/2020- Patient able to verbalize and demonstrate good understanding of current HEP including some supine and seated LE strengthening exercises. Reviewed and patient performed 10 reps today with minimal cueing. Will keep goal active to progress HEP as appropriate. 05/28/2020- patient reports continues to perform LE strengthening as able and some help from caregiver as able. No changes at this time to home program as patient is limited to supine/seated therex. 07/02/2020- Patient continues to report compliance with current supine and seated LE strengtheing home program and states no questions or concerns regarding her plan.   ? Time 12   ? Period Weeks   ? Status Achieved   ?  ? PT LONG TERM GOAL #2  ? Title The patient will demonstrate at least 10 point improvement on FOTO score indicating an improved ability to perform functional activities.   ? Baseline on eval (/720) score 12; 10/22/19: 12; 12/10/19: 18, 12/13: 18: 04/07/2020- Will assess next visit. 07/02/2020- Will obtain next visit.  09/24/2020= Will obtain next visit. 11/12/2020= 12; 12/22/2020= 12; 03/30/2021= 17   ? Time 12   ? Period Weeks   ? Status On-going   ? Target Date 06/22/21   ?  ? PT LONG TERM GOAL #3  ? Title The patient will demonstrate lateral scooting  for assistance with transfers with minA to maximize independence.   ? Baseline 10/22/19: Pt requires modA+1 for lateral scooting; 12/10/19: modA+1, 12/13: modA-maxA, 12/27: modA-maxA, 03/10/2020- ModA-MaxA- increased verbal cues and visual demonstration. 04/07/2020- Continued Max assist to perform forward/lateral scoot. 04/21/2020- Patient was able to demo slight lateral scoot with CGA approx 12 in then fatigued requiring max assist- she is able to scoot forward by leaning without significant issues.  05/28/2020- While sitting in chair- patient able to perform forward and lateral scoot with VC/visual demo and increased time allowed- CGA today but not consistent yet- will keep goal active at this time to continue to focus on strengthening and improving technique. 07/02/2020- Patient able to demonstrate minimal ability to laterally shift weigh on mat table requiring max assist yet is able to forward scoot out to edge of mat table with min Assist. 08/20/2020 - Patient was able to demonstrate minimal lateral scooting at edge of mat while holding feet still so they did not slip on tile surface. She continues to be able to demonstrate scoot forward and uses tilt option to recline to position safely back into seat of power wheelchair. 09/25/2020- Patient able to sit on mat and minimally scoot laterally but requires min/mod A for efficient scooting. Will end goal at this time due to plateau in progress with scooting over extended time.   ? Time 12   ? Period Weeks   ? Status Not Met   ?  ? PT LONG TERM GOAL #4  ? Title The patient will demonstrate a squat pivot transfer with minimum assistance to maximize independence and mobility.   ? Baseline 10/22/19: unable to safely attempt at this time; 12/10/19:  unable to safely attempt at this time, 01/14/20: unable to safety attempt at this time. 03/10/2020- Patient able to perform Stand pivot transfer with Maximal assist. 04/07/2020- Patient continues to require Max ass

## 2021-04-29 NOTE — Therapy (Signed)
[facility] ?[place], [place], [address] ?Phone: [phone]   Fax:  [phone] ? ?Occupational Therapy Treatment ? ?Patient Details  ?Name: Sherry [name] ?MRN: [id number] ?Date of Birth: Sep 06, 1934 ?No data recorded ? ?Encounter Date: 04/29/2021 ? ? OT End of Session - 04/29/21 1306   ? ? Visit Number 114   ? Number of Visits 154   ? Date for OT Re-Evaluation 07/01/21   ? Authorization Time Period Progress report period starting 11/26/2020   ? OT Start Time 1100   ? OT Stop Time 1145   ? OT Time Calculation (min) 45 min   ? Activity Tolerance Patient tolerated treatment well   ? Behavior During Therapy [facility] for tasks assessed/performed   ? ?  ?  ? ?  ? ? ?Past Medical History:  ?Diagnosis Date  ? Acute blood loss anemia   ? Arthritis   ? Cancer [facility])   ? skin  ? Central cord syndrome at C6 level of cervical spinal cord ([place]) 11/29/2017  ? Hypertension   ? Protein-calorie malnutrition, severe ([place]) 01/24/2018  ? S/P insertion of IVC (inferior vena caval) filter 01/24/2018  ? Tetraparesis ([place])   ? ? ?Past Surgical History:  ?Procedure Laterality Date  ? ANTERIOR CERVICAL DECOMP/DISCECTOMY FUSION N/A 11/29/2017  ? Procedure: Cervical five-six, six-seven Anterior Cervical Decompression Fusion;  Surgeon: Judith Part, MD;  Location: [facility];  Service: Neurosurgery;  Laterality: N/A;  Cervical five-six, six-seven Anterior Cervical Decompression Fusion  ? CATARACT EXTRACTION    ? IR IVC FILTER PLMT / S&I /IMG GUID/MOD SED  12/08/2017  ? ? ?There were no vitals filed for this visit. ? ? Subjective Assessment - 04/29/21 1305   ? ? Subjective  Pt. has had issues with he power w/c this week.   ? Patient is accompanied by: Family member   ? Pertinent History Patient s/p fall November 29, 2017 resulting in diagnosis of central cord syndrome at C 6 level.  She has had therapy in multiple venues but with recent move to [facility], therapy staff  recommended she seek outpatient therapy for her needs.   ? Patient Stated Goals Patient would like to be able to move better in bed, stand and perform self care tasks.   ? Currently in Pain? No/denies   ? ?  ?  ? ?  ? ?OT TREATMENT   ?   ?Therapeutic Ex. ?  ?Pt. tolerated PROM bilateral wrist extension, digit MP, PIP, and DIP flexion, and extension, thumb radial, and palmar abduction in conjunction with moist heat. Emphasis was placed on left wrist extension, bilateral thumb radial, and palmar abduction, and bilateral digit MP, PIP, and DIP flexion. Pt. worked on [employer], and reciprocal motion using the UBE while sitting for 8 min. with minimal resistance.  ?  ?Pt. reports having had issues with her power w/c this week, and is planning to have it repaired tomorrow. Pt. continues to present with bilateral wrist, and digit tightness, and continues to work towards improving with reaching across midline. Pt. is initiating more attempts with [facility] skills, and grasping, however continues to require increased work to improve grasping patterns with both the right, and left hands. Pt. Continues to tolerate the UBE without difficulty with increasing resistance. Pt. tolerated ROM well without reports of pain, or discomfort following moist heat modality. Pt. continues to present with bilateral wrist flexor tightness, and digit MP, PIP, and DIP extensor tightness. Pt.  continues to benefit from working on impoving ROM in order to work towards increasing engagement of her bilateral hands during ADLs, and IADL tasks.  ? ? ? ? ? ? ? ? ? ? ? ? ? ? ? ? ? ? ? ? ? OT Education - 04/29/21 1306   ? ? Education Details BUE functioning, and strengtheing   ? Person(s) Educated Associate Professor)   ? Methods Explanation;Demonstration;Verbal cues   ? Comprehension Verbalized understanding;Need further instruction;Verbal cues required   ? ?  ?  ? ?  ? ? ? ? ? ? OT Long Term Goals - 04/13/21 1113   ? ?  ? OT LONG TERM GOAL #1  ? Title  Patient and caregiver will demonstrate understanding of home exercise program for ROM.   ? Baseline Pt. has recently been in quarantine for COVID-19. Pt.'s current restorative aide is retiring at the end of December. Pt. continues to have a restorative aide rehab aide assist her wih exercises at [facility]. 8/8: Independently completes/directs HEP mulitple times/week. 12/17/2020: Pt. has a new restorative aide who will be resuming ROM program.   ? Time 12   ? Period Weeks   ? Status On-going   ?  ? OT LONG TERM GOAL #4  ? Title Patient will demonstrate improved composite finger flexion to hold deodorant to apply to underarms.   ? Baseline 01/12/2021:  Pt. is progressing, and is able to reach to grasp, and hold narrow cones. 10/20/2020: Pt. conitnues to make steady progress with bilateral digit flexion.Pt. continues to present with stiifness/tightness which is hindering pt.'s ability to formulate a full composite fist. Pt. continues to present with improving finger flexion of R hand for a partial gross grasp, but unable to grasp and hold items. pt. conitnues to present with limited digit flexion of L hand. Pt. has active left thumb abduction. 8/8: holds cup with 2 hands, continues to have limited composite finger flexion L worse than R. 02/16/2021: pt. continues to present with limited bilateral gross composite fisting. 04/08/2021: pt. continues to present with limited digit flexion affecting her ability to formulate bilateral composite fisting. 110th visivt: Pt. is able to hold the utensils in preparation for self-feeding.   ? Time 12   ? Period Weeks   ? Status On-going   ? Target Date 07/01/21   ?  ? OT LONG TERM GOAL #6  ? Title Patient will increase right UE ROM to comb the back of her hair with modified independence.   ? Baseline 01/12/2022: Pt. is able to comb her hair, reaching to the back of her hair.. 10/20/2020: Pt. continues to progress wtith RRUE ROM, and is able to use a pic for the back of her head,  however is unable to brush the back of her hair. 07/30/2020: Pt. is now able to comb the back of her hair with a pic, however continues to work on completing the task efficiently. 8/8: Achieves with great effort, has assistance for efficiancy. 02/16/2021: Pt. is now able to reach, and brush the back of her hair. 04/08/2021: pt. continues to improve reach towards the back of her hair. 110th visit: Pt. is to reach towards the back of her hair, however has difficulty sustaining her arms in elevaton for the entire duration of perfroming hair cre thoroughly.   ? Time 12   ? Period Weeks   ? Status Partially Met   ? Target Date 07/01/21   ?  ? OT LONG TERM GOAL #8  ?  Title Pt. will write, and sign her name with 100% legibility, and modified independence.   ? Baseline 01/12/2021: Pt. continnues to fill out her daily menu and complete puzzles. 10/20/2020: Pt. continues to make progress overall. Pt. continues to consistently fill out her daily menu, and writing for crossword puzzles. Pt. is able to maintain grasp on a wide width pen. Contineus to work on [employer]. 8/8: writes daily, continues to work on improving speed. 02/16/2021: Pt. continues to present wiht limited writing legibility, fluctuating daily. 04/08/2021: Pt. continues to to be able to fill out her daily menu.limited writing legibility for daily puzzles.110th visit: Pt. continues to present with limited legibility, and efficiency for completing daily puzzles.   ? Time 12   ? Period Weeks   ? Status Partially Met   ? Target Date 07/01/21   ? ?  ?  ? ?  ? ? ? ? ? ? ? ? Plan - 04/29/21 1308   ? ? Clinical Impression Statement Pt. reports having had issues with her power w/c this week, and is planning to have it repaired tomorrow. Pt. continues to present with bilateral wrist, and digit tightness, and continues to work towards improving with reaching across midline. Pt. is initiating more attempts with [facility] skills, and grasping, however continues to require  increased work to improve grasping patterns with both the right, and left hands. Pt. Continues to tolerate the UBE without difficulty with increasing resistance. Pt. tolerated ROM well without reports of pain, or discom

## 2021-05-04 ENCOUNTER — Ambulatory Visit: Payer: Medicare HMO | Admitting: Occupational Therapy

## 2021-05-04 ENCOUNTER — Ambulatory Visit: Payer: Medicare HMO

## 2021-05-06 ENCOUNTER — Ambulatory Visit: Payer: Medicare HMO

## 2021-05-06 ENCOUNTER — Ambulatory Visit: Payer: Medicare HMO | Admitting: Occupational Therapy

## 2021-05-11 ENCOUNTER — Ambulatory Visit: Payer: Medicare HMO | Attending: Internal Medicine | Admitting: Occupational Therapy

## 2021-05-11 ENCOUNTER — Encounter: Payer: Self-pay | Admitting: Occupational Therapy

## 2021-05-11 ENCOUNTER — Ambulatory Visit: Payer: Medicare HMO

## 2021-05-11 DIAGNOSIS — R262 Difficulty in walking, not elsewhere classified: Secondary | ICD-10-CM

## 2021-05-11 DIAGNOSIS — S14129S Central cord syndrome at unspecified level of cervical spinal cord, sequela: Secondary | ICD-10-CM

## 2021-05-11 DIAGNOSIS — R2681 Unsteadiness on feet: Secondary | ICD-10-CM | POA: Diagnosis present

## 2021-05-11 DIAGNOSIS — R269 Unspecified abnormalities of gait and mobility: Secondary | ICD-10-CM

## 2021-05-11 DIAGNOSIS — R278 Other lack of coordination: Secondary | ICD-10-CM

## 2021-05-11 DIAGNOSIS — M6281 Muscle weakness (generalized): Secondary | ICD-10-CM

## 2021-05-11 DIAGNOSIS — R2689 Other abnormalities of gait and mobility: Secondary | ICD-10-CM | POA: Diagnosis present

## 2021-05-11 NOTE — Therapy (Signed)
[place] ?[facility] ?[place], [place], [address] ?Phone: [phone]   Fax:  [phone] ? ?Physical Therapy Treatment ? ?Patient Details  ?Name: Sherry [name] ?MRN: [id number] ?Date of Birth: 05/28/1934 ?No data recorded ? ?Encounter Date: 05/11/2021 ? ? PT End of Session - 05/11/21 1608   ? ? Visit Number [id number]   ? Number of Visits 153   ? Date for PT Re-Evaluation 06/22/21   ? Authorization Type [employer] FOTO performed by PT on eval (7/20), score 12, Progress note on 03/10/2020; PN on 08/20/2020   ? Authorization Time Period Recert 9/0/3009- 2/33/0076; PN on 03/06/6331; Recert 5/45/6256- 38/93/7342; PN on 8/76/8115; Recert 72/62/0355- 9/74/1638; Recert 4/53/6468-0/32/1224   ? PT Start Time 1147   ? PT Stop Time 1226   ? PT Time Calculation (min) 39 min   ? Equipment Utilized During Treatment Gait belt   ? Activity Tolerance Patient tolerated treatment well;Patient limited by fatigue   ? Behavior During Therapy [facility] for tasks assessed/performed   ? ?  ?  ? ?  ? ? ?Past Medical History:  ?Diagnosis Date  ? Acute blood loss anemia   ? Arthritis   ? Cancer [facility])   ? skin  ? Central cord syndrome at C6 level of cervical spinal cord ([place]) 11/29/2017  ? Hypertension   ? Protein-calorie malnutrition, severe ([place]) 01/24/2018  ? S/P insertion of IVC (inferior vena caval) filter 01/24/2018  ? Tetraparesis ([place])   ? ? ?Past Surgical History:  ?Procedure Laterality Date  ? ANTERIOR CERVICAL DECOMP/DISCECTOMY FUSION N/A 11/29/2017  ? Procedure: Cervical five-six, six-seven Anterior Cervical Decompression Fusion;  Surgeon: Judith Part, MD;  Location: [place];  Service: Neurosurgery;  Laterality: N/A;  Cervical five-six, six-seven Anterior Cervical Decompression Fusion  ? CATARACT EXTRACTION    ? IR IVC FILTER PLMT / S&I /IMG GUID/MOD SED  12/08/2017  ? ? ?There were no vitals filed for this visit. ? ? Subjective Assessment - 05/11/21 1606   ? ? Subjective  Patient reports her chair is fixed and agreeable to standing today.   ? Pertinent History Pt is an 87 yo female that fell in 2019, fractured vertebrae in her neck and in her low back per family. 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 After hospital stay, pt discharged to [facility] for ~1 month, experienced severe UTI as well as bilateral DVT (IVC filter placed). Discharged to [facility], stayed for about a year, and then moved to [facility] in April 2021. Was receiving PT, but per family facility reported that did not have adequate equipment to maximize PT for her. Pt until about 1 month ago was practicing sit to stand transfers with 1-2 people, and working on static standing. Has a brace for L foot due to PF. Pt currently needs assistance with all ADLs (able to assist with donning/doffing her shirt), bed baths, and needs a hoyer lift for transfers. Able to drive power wheelchair without assistance.   ? Limitations Standing;Walking;House hold activities;Lifting   ? How long can you sit comfortably? no issues   ? How long can you stand comfortably? unable   ? How long can you walk comfortably? unable   ? Patient Stated Goals "to stand up and walk"   ? Currently in Pain? No/denies   ? ?  ?  ? ?  ? ? ?INTERVENTIONS:  ? ?Therapeutic activities:  ? ?Sit to stand in // bars with patient holding onto  bars- 5 trials today ranging from initial Max assist with knee blocked down to last trial only minimal assist.  ? ?Standing trials ? ?1) static x 2 min 55 sec - Difficulty bringing hips anterior.  ?2) dynamic lateral weight shifting x 2 min then continuing standing for 45 sec- No knee buckling.  ?3) Dynamic A/P weight shifting x 2 min (much more difficult with anterior weight shift but did vastly improve with VC/min assist) ?4) Static standing - erect posture- Min assist to bring hips anterior  - able to stand approx 2 min ?5) Static standing - Approx  2 min 45 sec - best stand - once she was assist in erect  standing- able to hold well for approx just over 2 min with only CGA.  ? ?Education provided throughout session via VC/TC and demonstration to facilitate movement at target joints and correct muscle activation for all testing and exercises performed.  ? ? ? ? ? ? ? ? ? ? ? ? ? ? ? ? ? ? ? ? ? ? ? ? ? ? PT Education - 05/11/21 1607   ? ? Education Details Standing postural education   ? Person(s) Educated Patient   ? Methods Explanation;Demonstration;Tactile cues;Verbal cues   ? Comprehension Verbalized understanding;Returned demonstration;Verbal cues required;Tactile cues required;Need further instruction   ? ?  ?  ? ?  ? ? ? PT Short Term Goals - 03/10/20 2211   ? ?  ? PT SHORT TERM GOAL #1  ? Title The patient will perform initial HEP with minimum assistance in order to improve strength and function.   ? Baseline Patient demonstrating independence with initial HEP as of 03/10/2020 with no questions or difficulty.   ? Time 4   ? Period Weeks   ? Status Achieved   ? Target Date 02/11/20   ? ?  ?  ? ?  ? ? ? ? PT Long Term Goals - 03/30/21 1150   ? ?  ? PT LONG TERM GOAL #1  ? Title The patient will be compliant with finalized HEP with minimum assistance in preparation for self management and maintenance of condition.   ? Baseline 03/10/2020- Patient familiar with initial HEP and will keep goal active as HEP becomes more progressive including possible transfers and standing activities. 04/07/2020- Patient continues to report compliance with LE strengthening home exercises without questions or concerns. 04/21/2020- Patient able to verbalize and demonstrate good understanding of current HEP including some supine and seated LE strengthening exercises. Reviewed and patient performed 10 reps today with minimal cueing. Will keep goal active to progress HEP as appropriate. 05/28/2020- patient reports continues to perform LE strengthening as able and some help from caregiver as able. No changes at this time to home program as  patient is limited to supine/seated therex. 07/02/2020- Patient continues to report compliance with current supine and seated LE strengtheing home program and states no questions or concerns regarding her plan.   ? Time 12   ? Period Weeks   ? Status Achieved   ?  ? PT LONG TERM GOAL #2  ? Title The patient will demonstrate at least 10 point improvement on FOTO score indicating an improved ability to perform functional activities.   ? Baseline on eval (/720) score 12; 10/22/19: 12; 12/10/19: 18, 12/13: 18: 04/07/2020- Will assess next visit. 07/02/2020- Will obtain next visit. 09/24/2020= Will obtain next visit. 11/12/2020= 12; 12/22/2020= 12; 03/30/2021= 17   ? Time 12   ? Period Weeks   ?  Status On-going   ? Target Date 06/22/21   ?  ? PT LONG TERM GOAL #3  ? Title The patient will demonstrate lateral scooting  for assistance with transfers with minA to maximize independence.   ? Baseline 10/22/19: Pt requires modA+1 for lateral scooting; 12/10/19: modA+1, 12/13: modA-maxA, 12/27: modA-maxA, 03/10/2020- ModA-MaxA- increased verbal cues and visual demonstration. 04/07/2020- Continued Max assist to perform forward/lateral scoot. 04/21/2020- Patient was able to demo slight lateral scoot with CGA approx 12 in then fatigued requiring max assist- she is able to scoot forward by leaning without significant issues. 05/28/2020- While sitting in chair- patient able to perform forward and lateral scoot with VC/visual demo and increased time allowed- CGA today but not consistent yet- will keep goal active at this time to continue to focus on strengthening and improving technique. 07/02/2020- Patient able to demonstrate minimal ability to laterally shift weigh on mat table requiring max assist yet is able to forward scoot out to edge of mat table with min Assist. 08/20/2020 - Patient was able to demonstrate minimal lateral scooting at edge of mat while holding feet still so they did not slip on tile surface. She continues to be able to  demonstrate scoot forward and uses tilt option to recline to position safely back into seat of power wheelchair. 09/25/2020- Patient able to sit on mat and minimally scoot laterally but requires min/mod A for efficien

## 2021-05-11 NOTE — Therapy (Signed)
[place] ?[facility] ?[place], [place], [address] ?Phone: [phone]   Fax:  [phone] ? ?Occupational Therapy Treatment ? ?Patient Details  ?Name: Sherry [name] ?MRN: [id number] ?Date of Birth: 1934/10/28 ?No data recorded ? ?Encounter Date: 05/11/2021 ? ? OT End of Session - 05/11/21 1212   ? ? Visit Number 115   ? Number of Visits 154   ? Date for OT Re-Evaluation 07/01/21   ? Authorization Time Period Progress report period starting 11/26/2020   ? OT Start Time 1100   ? OT Stop Time 1145   ? OT Time Calculation (min) 45 min   ? Activity Tolerance Patient tolerated treatment well   ? Behavior During Therapy [facility] for tasks assessed/performed   ? ?  ?  ? ?  ? ? ?Past Medical History:  ?Diagnosis Date  ? Acute blood loss anemia   ? Arthritis   ? Cancer [facility])   ? skin  ? Central cord syndrome at C6 level of cervical spinal cord ([place]) 11/29/2017  ? Hypertension   ? Protein-calorie malnutrition, severe ([place]) 01/24/2018  ? S/P insertion of IVC (inferior vena caval) filter 01/24/2018  ? Tetraparesis ([place])   ? ? ?Past Surgical History:  ?Procedure Laterality Date  ? ANTERIOR CERVICAL DECOMP/DISCECTOMY FUSION N/A 11/29/2017  ? Procedure: Cervical five-six, six-seven Anterior Cervical Decompression Fusion;  Surgeon: Judith Part, MD;  Location: [place];  Service: Neurosurgery;  Laterality: N/A;  Cervical five-six, six-seven Anterior Cervical Decompression Fusion  ? CATARACT EXTRACTION    ? IR IVC FILTER PLMT / S&I /IMG GUID/MOD SED  12/08/2017  ? ? ?There were no vitals filed for this visit. ? ? Subjective Assessment - 05/11/21 1212   ? ? Subjective  Pt. has had issues with he power w/c this week.   ? Patient is accompanied by: Family member   ? Pertinent History Patient s/p fall November 29, 2017 resulting in diagnosis of central cord syndrome at C 6 level.  She has had therapy in multiple venues but with recent move to [facility], therapy staff  recommended she seek outpatient therapy for her needs.   ? Currently in Pain? No/denies   ? ?  ?  ? ?  ? ?OT TREATMENT   ?   ?Therapeutic Ex. ?  ?Pt. tolerated PROM bilateral wrist extension, digit MP, PIP, and DIP flexion, and extension, thumb radial, and palmar abduction in conjunction with moist heat. Emphasis was placed on left wrist extension, bilateral thumb radial, and palmar abduction, and bilateral digit MP, PIP, and DIP flexion. Pt. worked on [employer], and reciprocal motion using the UBE while sitting for 8 min. with minimal resistance.  ? ?Pt. reports that she was able to get her power w/c fixed. Pt. reports having had schedule changes last week, and was unable to come dur to being scheduled at a different time than her usual appointment times. Pt. continues to present with bilateral wrist, and digit tightness, and continues to work towards improving with reaching across midline. Pt. is initiating more attempts with [facility] skills, and grasping, however continues to require increased work to improve grasping patterns with both the right, and left hands. Pt. Continues to tolerate the UBE without difficulty with increasing resistance. Pt. tolerated ROM well without reports of pain, or discomfort following moist heat modality. Pt. continues to present with bilateral wrist flexor tightness, and digit MP, PIP, and DIP extensor tightness. Pt. continues to benefit from working  on impoving ROM in order to work towards increasing engagement of her bilateral hands during ADLs, and IADL tasks.  ?  ? ? ? ? ? ? ? ? ? ? ? ? ? ? ? ? ? ? ? ? OT Education - 05/11/21 1212   ? ? Education Details BUE functioning, and strengtheing   ? Person(s) Educated Associate Professor)   ? Methods Explanation;Demonstration;Verbal cues   ? Comprehension Verbalized understanding;Need further instruction;Verbal cues required   ? ?  ?  ? ?  ? ? ? ? ? ? OT Long Term Goals - 04/13/21 1113   ? ?  ? OT LONG TERM GOAL #1  ? Title Patient  and caregiver will demonstrate understanding of home exercise program for ROM.   ? Baseline Pt. has recently been in quarantine for COVID-19. Pt.'s current restorative aide is retiring at the end of December. Pt. continues to have a restorative aide rehab aide assist her wih exercises at [facility]. 8/8: Independently completes/directs HEP mulitple times/week. 12/17/2020: Pt. has a new restorative aide who will be resuming ROM program.   ? Time 12   ? Period Weeks   ? Status On-going   ?  ? OT LONG TERM GOAL #4  ? Title Patient will demonstrate improved composite finger flexion to hold deodorant to apply to underarms.   ? Baseline 01/12/2021:  Pt. is progressing, and is able to reach to grasp, and hold narrow cones. 10/20/2020: Pt. conitnues to make steady progress with bilateral digit flexion.Pt. continues to present with stiifness/tightness which is hindering pt.'s ability to formulate a full composite fist. Pt. continues to present with improving finger flexion of R hand for a partial gross grasp, but unable to grasp and hold items. pt. conitnues to present with limited digit flexion of L hand. Pt. has active left thumb abduction. 8/8: holds cup with 2 hands, continues to have limited composite finger flexion L worse than R. 02/16/2021: pt. continues to present with limited bilateral gross composite fisting. 04/08/2021: pt. continues to present with limited digit flexion affecting her ability to formulate bilateral composite fisting. 110th visivt: Pt. is able to hold the utensils in preparation for self-feeding.   ? Time 12   ? Period Weeks   ? Status On-going   ? Target Date 07/01/21   ?  ? OT LONG TERM GOAL #6  ? Title Patient will increase right UE ROM to comb the back of her hair with modified independence.   ? Baseline 01/12/2022: Pt. is able to comb her hair, reaching to the back of her hair.. 10/20/2020: Pt. continues to progress wtith RRUE ROM, and is able to use a pic for the back of her head, however is  unable to brush the back of her hair. 07/30/2020: Pt. is now able to comb the back of her hair with a pic, however continues to work on completing the task efficiently. 8/8: Achieves with great effort, has assistance for efficiancy. 02/16/2021: Pt. is now able to reach, and brush the back of her hair. 04/08/2021: pt. continues to improve reach towards the back of her hair. 110th visit: Pt. is to reach towards the back of her hair, however has difficulty sustaining her arms in elevaton for the entire duration of perfroming hair cre thoroughly.   ? Time 12   ? Period Weeks   ? Status Partially Met   ? Target Date 07/01/21   ?  ? OT LONG TERM GOAL #8  ? Title Pt. will  write, and sign her name with 100% legibility, and modified independence.   ? Baseline 01/12/2021: Pt. continnues to fill out her daily menu and complete puzzles. 10/20/2020: Pt. continues to make progress overall. Pt. continues to consistently fill out her daily menu, and writing for crossword puzzles. Pt. is able to maintain grasp on a wide width pen. Contineus to work on [employer]. 8/8: writes daily, continues to work on improving speed. 02/16/2021: Pt. continues to present wiht limited writing legibility, fluctuating daily. 04/08/2021: Pt. continues to to be able to fill out her daily menu.limited writing legibility for daily puzzles.110th visit: Pt. continues to present with limited legibility, and efficiency for completing daily puzzles.   ? Time 12   ? Period Weeks   ? Status Partially Met   ? Target Date 07/01/21   ? ?  ?  ? ?  ? ? ? ? ? ? ? ? Plan - 05/11/21 1212   ? ? Clinical Impression Statement Pt. reports that she was able to get her power w/c fixed. Pt. reports having had schedule changes last week, and was unable to come dur to being scheduled at a different time than her usual appointment times. Pt. continues to present with bilateral wrist, and digit tightness, and continues to work towards improving with reaching across midline. Pt.  is initiating more attempts with [facility] skills, and grasping, however continues to require increased work to improve grasping patterns with both the right, and left hands. Pt. Continues to tolerate the UBE without dif

## 2021-05-13 ENCOUNTER — Encounter: Payer: Self-pay | Admitting: Occupational Therapy

## 2021-05-13 ENCOUNTER — Ambulatory Visit: Payer: Medicare HMO | Admitting: Occupational Therapy

## 2021-05-13 ENCOUNTER — Ambulatory Visit: Payer: Medicare HMO

## 2021-05-13 DIAGNOSIS — R269 Unspecified abnormalities of gait and mobility: Secondary | ICD-10-CM

## 2021-05-13 DIAGNOSIS — R278 Other lack of coordination: Secondary | ICD-10-CM

## 2021-05-13 DIAGNOSIS — R2681 Unsteadiness on feet: Secondary | ICD-10-CM

## 2021-05-13 DIAGNOSIS — M6281 Muscle weakness (generalized): Secondary | ICD-10-CM

## 2021-05-13 DIAGNOSIS — R2689 Other abnormalities of gait and mobility: Secondary | ICD-10-CM

## 2021-05-13 DIAGNOSIS — R262 Difficulty in walking, not elsewhere classified: Secondary | ICD-10-CM

## 2021-05-13 NOTE — Therapy (Signed)
[facility] ?[facility] ?[place], [place], [address] ?Phone: [phone]   Fax:  [phone] ? ?Occupational Therapy Treatment ? ?Patient Details  ?Name: Sherry [name] ?MRN: [id number] ?Date of Birth: 1935/01/25 ?No data recorded ? ?Encounter Date: 05/13/2021 ? ? OT End of Session - 05/13/21 1206   ? ? Visit Number [id number]   ? Number of Visits 154   ? Date for OT Re-Evaluation 07/01/21   ? Authorization Time Period Progress report period starting 11/26/2020   ? OT Start Time 1103   ? OT Stop Time 1145   ? OT Time Calculation (min) 42 min   ? Equipment Utilized During Treatment moist heat   ? Activity Tolerance Patient tolerated treatment well   ? Behavior During Therapy [facility] for tasks assessed/performed   ? ?  ?  ? ?  ? ? ?Past Medical History:  ?Diagnosis Date  ? Acute blood loss anemia   ? Arthritis   ? Cancer [facility])   ? skin  ? Central cord syndrome at C6 level of cervical spinal cord ([place]) 11/29/2017  ? Hypertension   ? Protein-calorie malnutrition, severe ([place]) 01/24/2018  ? S/P insertion of IVC (inferior vena caval) filter 01/24/2018  ? Tetraparesis ([place])   ? ? ?Past Surgical History:  ?Procedure Laterality Date  ? ANTERIOR CERVICAL DECOMP/DISCECTOMY FUSION N/A 11/29/2017  ? Procedure: Cervical five-six, six-seven Anterior Cervical Decompression Fusion;  Surgeon: Judith Part, MD;  Location: [facility];  Service: Neurosurgery;  Laterality: N/A;  Cervical five-six, six-seven Anterior Cervical Decompression Fusion  ? CATARACT EXTRACTION    ? IR IVC FILTER PLMT / S&I /IMG GUID/MOD SED  12/08/2017  ? ? ?There were no vitals filed for this visit. ? ? Subjective Assessment - 05/13/21 1206   ? ? Subjective  Pt. reports doing well today   ? Patient is accompanied by: Family member   ? Pertinent History Patient s/p fall November 29, 2017 resulting in diagnosis of central cord syndrome at C 6 level.  She has had therapy in multiple venues but with recent move to  [facility], therapy staff recommended she seek outpatient therapy for her needs.   ? Currently in Pain? No/denies   ? ?  ?  ? ?  ? ?OT TREATMENT   ?   ?Therapeutic Ex. ?  ?Pt. tolerated PROM bilateral wrist extension, digit MP, PIP, and DIP flexion, and extension, thumb radial, and palmar abduction in conjunction with moist heat. Emphasis was placed on left wrist extension, bilateral thumb radial, and palmar abduction, and bilateral digit MP, PIP, and DIP flexion. Pt. worked on [employer], and reciprocal motion using the UBE while sitting for 8 min. with minimal resistance. Pt. Tolerated functional reaching for cones, and placing them out on the ipsilateral side to encourage flexion at the trunk, and crossing midline to place them on the contralateral side.  ? ?Manual Therapy: ? ?Pt. Tolerated soft tissue massage to the volar, and dorsal aspect of the 2nd through 5th digits for stiffness independent of, and in preparation for therapeutic Ex.  ?  ?Pt. continues to present with bilateral wrist, and digit tightness, and continues to work towards improving with reaching across midline. Pt. tolerated manual therapy well. Pt. is initiating more attempts with [facility] skills, and grasping, however continues to require increased work to improve grasping patterns with both the right, and left hands. Pt. Continues to tolerate the UBE without difficulty with increasing resistance. Pt. tolerated ROM  well without reports of pain, or discomfort following moist heat modality. Pt. continues to present with bilateral wrist flexor tightness, and digit MP, PIP, and DIP extensor tightness. Pt. continues to benefit from working on impoving ROM in order to work towards increasing engagement of her bilateral hands during ADLs, and IADL tasks.  ?  ?  ? ? ? ? ? ? ? ? ? ? ? ? ? ? ? ? ? ? ? ? ? OT Education - 05/13/21 1206   ? ? Education Details BUE functioning, and strengtheing   ? Person(s) Educated Associate Professor)   ? Methods  Explanation;Demonstration;Verbal cues   ? Comprehension Verbalized understanding;Need further instruction;Verbal cues required   ? ?  ?  ? ?  ? ? ? ? ? ? OT Long Term Goals - 04/13/21 1113   ? ?  ? OT LONG TERM GOAL #1  ? Title Patient and caregiver will demonstrate understanding of home exercise program for ROM.   ? Baseline Pt. has recently been in quarantine for COVID-19. Pt.'s current restorative aide is retiring at the end of December. Pt. continues to have a restorative aide rehab aide assist her wih exercises at [facility]. 8/8: Independently completes/directs HEP mulitple times/week. 12/17/2020: Pt. has a new restorative aide who will be resuming ROM program.   ? Time 12   ? Period Weeks   ? Status On-going   ?  ? OT LONG TERM GOAL #4  ? Title Patient will demonstrate improved composite finger flexion to hold deodorant to apply to underarms.   ? Baseline 01/12/2021:  Pt. is progressing, and is able to reach to grasp, and hold narrow cones. 10/20/2020: Pt. conitnues to make steady progress with bilateral digit flexion.Pt. continues to present with stiifness/tightness which is hindering pt.'s ability to formulate a full composite fist. Pt. continues to present with improving finger flexion of R hand for a partial gross grasp, but unable to grasp and hold items. pt. conitnues to present with limited digit flexion of L hand. Pt. has active left thumb abduction. 8/8: holds cup with 2 hands, continues to have limited composite finger flexion L worse than R. 02/16/2021: pt. continues to present with limited bilateral gross composite fisting. 04/08/2021: pt. continues to present with limited digit flexion affecting her ability to formulate bilateral composite fisting. 110th visivt: Pt. is able to hold the utensils in preparation for self-feeding.   ? Time 12   ? Period Weeks   ? Status On-going   ? Target Date 07/01/21   ?  ? OT LONG TERM GOAL #6  ? Title Patient will increase right UE ROM to comb the back of her hair  with modified independence.   ? Baseline 01/12/2022: Pt. is able to comb her hair, reaching to the back of her hair.. 10/20/2020: Pt. continues to progress wtith RRUE ROM, and is able to use a pic for the back of her head, however is unable to brush the back of her hair. 07/30/2020: Pt. is now able to comb the back of her hair with a pic, however continues to work on completing the task efficiently. 8/8: Achieves with great effort, has assistance for efficiancy. 02/16/2021: Pt. is now able to reach, and brush the back of her hair. 04/08/2021: pt. continues to improve reach towards the back of her hair. 110th visit: Pt. is to reach towards the back of her hair, however has difficulty sustaining her arms in elevaton for the entire duration of perfroming hair cre thoroughly.   ?  Time 12   ? Period Weeks   ? Status Partially Met   ? Target Date 07/01/21   ?  ? OT LONG TERM GOAL #8  ? Title Pt. will write, and sign her name with 100% legibility, and modified independence.   ? Baseline 01/12/2021: Pt. continnues to fill out her daily menu and complete puzzles. 10/20/2020: Pt. continues to make progress overall. Pt. continues to consistently fill out her daily menu, and writing for crossword puzzles. Pt. is able to maintain grasp on a wide width pen. Contineus to work on [employer]. 8/8: writes daily, continues to work on improving speed. 02/16/2021: Pt. continues to present wiht limited writing legibility, fluctuating daily. 04/08/2021: Pt. continues to to be able to fill out her daily menu.limited writing legibility for daily puzzles.110th visit: Pt. continues to present with limited legibility, and efficiency for completing daily puzzles.   ? Time 12   ? Period Weeks   ? Status Partially Met   ? Target Date 07/01/21   ? ?  ?  ? ?  ? ? ? ? ? ? ? ? Plan - 05/13/21 1207   ? ? Clinical Impression Statement Pt. continues to present with bilateral wrist, and digit tightness, and continues to work towards improving with  reaching across midline. Pt. tolerated manual therapy well. Pt. is initiating more attempts with [facility] skills, and grasping, however continues to require increased work to improve grasping patterns with both the rig

## 2021-05-13 NOTE — Therapy (Signed)
[place] ?[facility] ?[place], [place], [address] ?Phone: [phone]   Fax:  [phone] ? ?Physical Therapy Treatment ? ?Patient Details  ?Name: Sherry [name] ?MRN: [id number] ?Date of Birth: 11/28/1934 ?No data recorded ? ?Encounter Date: 05/13/2021 ? ? PT End of Session - 05/13/21 1117   ? ? Visit Number [id number]   ? Number of Visits 153   ? Date for PT Re-Evaluation 06/22/21   ? Authorization Type [employer] FOTO performed by PT on eval (7/20), score 12, Progress note on 03/10/2020; PN on 08/20/2020   ? Authorization Time Period Recert 05/04/3293- 1/88/4166; PN on 0/07/3014; Recert 0/11/9321- 55/73/2202; PN on 5/42/7062; Recert 37/62/8315- 1/76/1607; Recert 3/71/0626-9/48/5462   ? PT Start Time 1146   ? PT Stop Time 1225   ? PT Time Calculation (min) 39 min   ? Equipment Utilized During Treatment Gait belt   ? Activity Tolerance Patient tolerated treatment well;Patient limited by fatigue   ? Behavior During Therapy [facility] for tasks assessed/performed   ? ?  ?  ? ?  ? ? ?Past Medical History:  ?Diagnosis Date  ? Acute blood loss anemia   ? Arthritis   ? Cancer [facility])   ? skin  ? Central cord syndrome at C6 level of cervical spinal cord ([place]) 11/29/2017  ? Hypertension   ? Protein-calorie malnutrition, severe ([place]) 01/24/2018  ? S/P insertion of IVC (inferior vena caval) filter 01/24/2018  ? Tetraparesis ([place])   ? ? ?Past Surgical History:  ?Procedure Laterality Date  ? ANTERIOR CERVICAL DECOMP/DISCECTOMY FUSION N/A 11/29/2017  ? Procedure: Cervical five-six, six-seven Anterior Cervical Decompression Fusion;  Surgeon: Judith Part, MD;  Location: [place];  Service: Neurosurgery;  Laterality: N/A;  Cervical five-six, six-seven Anterior Cervical Decompression Fusion  ? CATARACT EXTRACTION    ? IR IVC FILTER PLMT / S&I /IMG GUID/MOD SED  12/08/2017  ? ? ?There were no vitals filed for this visit. ? ? Subjective Assessment - 05/13/21 1116   ? ? Subjective  Patient reports doing well without complaints today.   ? Pertinent History Pt is an 87 yo female that fell in 2019, fractured vertebrae in her neck and in her low back per family. 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 After hospital stay, pt discharged to [facility] for ~1 month, experienced severe UTI as well as bilateral DVT (IVC filter placed). Discharged to [facility], stayed for about a year, and then moved to [facility] in April 2021. Was receiving PT, but per family facility reported that did not have adequate equipment to maximize PT for her. Pt until about 1 month ago was practicing sit to stand transfers with 1-2 people, and working on static standing. Has a brace for L foot due to PF. Pt currently needs assistance with all ADLs (able to assist with donning/doffing her shirt), bed baths, and needs a hoyer lift for transfers. Able to drive power wheelchair without assistance.   ? Limitations Standing;Walking;House hold activities;Lifting   ? How long can you sit comfortably? no issues   ? How long can you stand comfortably? unable   ? How long can you walk comfortably? unable   ? Patient Stated Goals "to stand up and walk"   ? Currently in Pain? No/denies   ? ?  ?  ? ?  ? ? ?INTERVENTIONS:  ? ? ?Therapuetic Activities:  ? ?Patient performed stand pivot transfer from power w/c to mat table- Max assist  to stand and pivot over to table. Difficulty keeping left knee flexed during transfer. Patient was later max assist back to power w/c from mat with inability to pivot.  ? ?Gait training with use of lite gait machine:  ?Patient was able to stand with difficulty ant weight shifting. She was unweighed and described no pain and harness supported - secured at hips and patient then ambulated in clinic approx 30 feet - Focusing on ability to advance each LE. She did exhibit some difficulty with Left LE placement- scissoring over  midline. She was able to bear some weight through each LE and required  mod A at trunk to weight shift. Patient stopped due to fatigue.  ? ?Education provided throughout session via VC/TC and demonstration to facilitate movement at target joints and correct muscle activation for all testing and exercises performed.  ? ? ? ? ? ? ? ? ? ? ? ? ? ? ? ? ? ? ? ? ? ? ? ? ? PT Education - 05/14/21 1319   ? ? Education Details Gait technique with use of unweighing system   ? Person(s) Educated Patient   ? Methods Explanation;Demonstration;Tactile cues;Verbal cues   ? Comprehension Verbalized understanding;Returned demonstration;Verbal cues required;Tactile cues required;Need further instruction   ? ?  ?  ? ?  ? ? ? PT Short Term Goals - 03/10/20 2211   ? ?  ? PT SHORT TERM GOAL #1  ? Title The patient will perform initial HEP with minimum assistance in order to improve strength and function.   ? Baseline Patient demonstrating independence with initial HEP as of 03/10/2020 with no questions or difficulty.   ? Time 4   ? Period Weeks   ? Status Achieved   ? Target Date 02/11/20   ? ?  ?  ? ?  ? ? ? ? PT Long Term Goals - 03/30/21 1150   ? ?  ? PT LONG TERM GOAL #1  ? Title The patient will be compliant with finalized HEP with minimum assistance in preparation for self management and maintenance of condition.   ? Baseline 03/10/2020- Patient familiar with initial HEP and will keep goal active as HEP becomes more progressive including possible transfers and standing activities. 04/07/2020- Patient continues to report compliance with LE strengthening home exercises without questions or concerns. 04/21/2020- Patient able to verbalize and demonstrate good understanding of current HEP including some supine and seated LE strengthening exercises. Reviewed and patient performed 10 reps today with minimal cueing. Will keep goal active to progress HEP as appropriate. 05/28/2020- patient reports continues to perform LE strengthening as able and some help from caregiver as able. No changes at this time to home  program as patient is limited to supine/seated therex. 07/02/2020- Patient continues to report compliance with current supine and seated LE strengtheing home program and states no questions or concerns regarding her plan.   ? Time 12   ? Period Weeks   ? Status Achieved   ?  ? PT LONG TERM GOAL #2  ? Title The patient will demonstrate at least 10 point improvement on FOTO score indicating an improved ability to perform functional activities.   ? Baseline on eval (/720) score 12; 10/22/19: 12; 12/10/19: 18, 12/13: 18: 04/07/2020- Will assess next visit. 07/02/2020- Will obtain next visit. 09/24/2020= Will obtain next visit. 11/12/2020= 12; 12/22/2020= 12; 03/30/2021= 17   ? Time 12   ? Period Weeks   ? Status On-going   ? Target Date  06/22/21   ?  ? PT LONG TERM GOAL #3  ? Title The patient will demonstrate lateral scooting  for assistance with transfers with minA to maximize independence.   ? Baseline 10/22/19: Pt requires modA+1 for lateral scooting; 12/10/19: modA+1, 12/13: modA-maxA, 12/27: modA-maxA, 03/10/2020- ModA-MaxA- increased verbal cues and visual demonstration. 04/07/2020- Continued Max assist to perform forward/lateral scoot. 04/21/2020- Patient was able to demo slight lateral scoot with CGA approx 12 in then fatigued requiring max assist- she is able to scoot forward by leaning without significant issues. 05/28/2020- While sitting in chair- patient able to perform forward and lateral scoot with VC/visual demo and increased time allowed- CGA today but not consistent yet- will keep goal active at this time to continue to focus on strengthening and improving technique. 07/02/2020- Patient able to demonstrate minimal ability to laterally shift weigh on mat table requiring max assist yet is able to forward scoot out to edge of mat table with min Assist. 08/20/2020 - Patient was able to demonstrate minimal lateral scooting at edge of mat while holding feet still so they did not slip on tile surface. She continues to be able  to demonstrate scoot forward and uses tilt option to recline to position safely back into seat of power wheelchair. 09/25/2020- Patient able to sit on mat and minimally scoot laterally but requires min/mod A for

## 2021-05-18 ENCOUNTER — Encounter: Payer: Self-pay | Admitting: Occupational Therapy

## 2021-05-18 ENCOUNTER — Ambulatory Visit: Payer: Medicare HMO

## 2021-05-18 ENCOUNTER — Ambulatory Visit: Payer: Medicare HMO | Admitting: Occupational Therapy

## 2021-05-18 DIAGNOSIS — M6281 Muscle weakness (generalized): Secondary | ICD-10-CM

## 2021-05-18 DIAGNOSIS — R262 Difficulty in walking, not elsewhere classified: Secondary | ICD-10-CM

## 2021-05-18 DIAGNOSIS — R278 Other lack of coordination: Secondary | ICD-10-CM

## 2021-05-18 DIAGNOSIS — S14129S Central cord syndrome at unspecified level of cervical spinal cord, sequela: Secondary | ICD-10-CM

## 2021-05-18 DIAGNOSIS — R269 Unspecified abnormalities of gait and mobility: Secondary | ICD-10-CM

## 2021-05-18 DIAGNOSIS — R2681 Unsteadiness on feet: Secondary | ICD-10-CM

## 2021-05-18 DIAGNOSIS — R2689 Other abnormalities of gait and mobility: Secondary | ICD-10-CM

## 2021-05-18 NOTE — Therapy (Addendum)
[place] MAIN [facility] SERVICES [address] [place], [place], [address] Phone: [phone]   Fax:  [phone]  Physical Therapy Treatment/Physical Therapy Progress Note   Dates of reporting period  04/01/2021   to   05/18/2021  Patient Details  Name: Sherry [name] MRN: [id number] Date of Birth: 02-14-1934 No data recorded  Encounter Date: 05/18/2021     Past Medical History:  Diagnosis Date   Acute blood loss anemia    Arthritis    Cancer ([place])    skin   Central cord syndrome at C6 level of cervical spinal cord ([place]) 11/29/2017   Hypertension    Protein-calorie malnutrition, severe ([place]) 01/24/2018   S/P insertion of IVC (inferior vena caval) filter 01/24/2018   Tetraparesis ([place])     Past Surgical History:  Procedure Laterality Date   ANTERIOR CERVICAL DECOMP/DISCECTOMY FUSION N/A 11/29/2017   Procedure: Cervical five-six, six-seven Anterior Cervical Decompression Fusion;  Surgeon: Judith Part, MD;  Location: [place];  Service: Neurosurgery;  Laterality: N/A;  Cervical five-six, six-seven Anterior Cervical Decompression Fusion   CATARACT EXTRACTION     IR IVC FILTER PLMT / S&I /IMG GUID/MOD SED  12/08/2017    There were no vitals filed for this visit.      INTERVENTIONS:      Therapeutic Activities:    Patient performed stand pivot transfer from power w/c to mat table- Mod assist to stand and pivot today over to table.    Gait training with use of lite gait machine:  Patient was able to stand with use of lift and no issues with padding/harness.  She was unweighed to he point where her feet could barely touch and described no pain. patient then ambulated in clinic approx 30 feet - Focusing on ability to advance each LE. She did exhibit some difficulty with both LE placement- less scissoring with left but fatigued quickly. She was able to bear some weight through each LE and occasional mod A at trunk to lateral  weight  shift. Patient stopped due to fatigue.    Education provided throughout session via VC/TC and demonstration to facilitate movement at target joints and correct muscle activation for all testing and exercises performed.                             PT Short Term Goals - 03/10/20 2211       PT SHORT TERM GOAL #1   Title The patient will perform initial HEP with minimum assistance in order to improve strength and function.    Baseline Patient demonstrating independence with initial HEP as of 03/10/2020 with no questions or difficulty.    Time 4    Period Weeks    Status Achieved    Target Date 02/11/20               PT Long Term Goals - 06/24/21 1152       PT LONG TERM GOAL #1   Title The patient will be compliant with finalized HEP with minimum assistance in preparation for self management and maintenance of condition.    Baseline 03/10/2020- Patient familiar with initial HEP and will keep goal active as HEP becomes more progressive including possible transfers and standing activities. 04/07/2020- Patient continues to report compliance with LE strengthening home exercises without questions or concerns. 04/21/2020- Patient able to verbalize and demonstrate good understanding of current HEP including some supine and seated  LE strengthening exercises. Reviewed and patient performed 10 reps today with minimal cueing. Will keep goal active to progress HEP as appropriate. 05/28/2020- patient reports continues to perform LE strengthening as able and some help from caregiver as able. No changes at this time to home program as patient is limited to supine/seated therex. 07/02/2020- Patient continues to report compliance with current supine and seated LE strengtheing home program and states no questions or concerns regarding her plan.    Time 12    Period Weeks    Status Achieved      PT LONG TERM GOAL #2   Title The patient will demonstrate at least 10 point improvement on  FOTO score indicating an improved ability to perform functional activities.    Baseline on eval (/720) score 12; 10/22/19: 12; 12/10/19: 18, 12/13: 18: 04/07/2020- Will assess next visit. 07/02/2020- Will obtain next visit. 09/24/2020= Will obtain next visit. 11/12/2020= 12; 12/22/2020= 12; 03/30/2021= 17; 06/24/2021= Will assess next visit    Time 12    Period Weeks    Status On-going    Target Date 09/16/21      PT LONG TERM GOAL #3   Title The patient will demonstrate lateral scooting  for assistance with transfers with minA to maximize independence.    Baseline 10/22/19: Pt requires modA+1 for lateral scooting; 12/10/19: modA+1, 12/13: modA-maxA, 12/27: modA-maxA, 03/10/2020- ModA-MaxA- increased verbal cues and visual demonstration. 04/07/2020- Continued Max assist to perform forward/lateral scoot. 04/21/2020- Patient was able to demo slight lateral scoot with CGA approx 12 in then fatigued requiring max assist- she is able to scoot forward by leaning without significant issues. 05/28/2020- While sitting in chair- patient able to perform forward and lateral scoot with VC/visual demo and increased time allowed- CGA today but not consistent yet- will keep goal active at this time to continue to focus on strengthening and improving technique. 07/02/2020- Patient able to demonstrate minimal ability to laterally shift weigh on mat table requiring max assist yet is able to forward scoot out to edge of mat table with min Assist. 08/20/2020 - Patient was able to demonstrate minimal lateral scooting at edge of mat while holding feet still so they did not slip on tile surface. She continues to be able to demonstrate scoot forward and uses tilt option to recline to position safely back into seat of power wheelchair. 09/25/2020- Patient able to sit on mat and minimally scoot laterally but requires min/mod A for efficient scooting. Will end goal at this time due to plateau in progress with scooting over extended time.    Time 12     Period Weeks    Status Not Met      PT LONG TERM GOAL #4   Title The patient will demonstrate a squat pivot transfer with minimum assistance to maximize independence and mobility.    Baseline 10/22/19: unable to safely attempt at this time; 12/10/19: unable to safely attempt at this time, 01/14/20: unable to safety attempt at this time. 03/10/2020- Patient able to perform Stand pivot transfer with Maximal assist. 04/07/2020- Patient continues to require Max assist with sit to stand- will assist SPT next visit. 04/21/2020- Patient presents with Max assist to perform Sit to stand Transfer- unable to pivot or move Left LE well without difficulty. 07/02/2020= Max assist with SPT and minimal ability to pivot. 7/202/2022-Patient able to stand with minimal assist this visit (as good as CGA last visit) and able to move right foot to pivot and performing with moderate assist  today. 09/24/2020- Patient able to perform sit to stand with Min assist from lift chair. Patient has demo inconsistency and will keep goal active.  12/17/2020- Goal is currently not appropriate as patient fractured her ankle and currently NWB. 12/22/2020=Goal is currently not appropriate as patient fractured her ankle and currently NWB.  03/30/2021=Patient was able to demo min assist to stand holding onto PT back of arms yet continues to require max assist to pivot as she was unable to pivot or turn her legs consistently today. 06/24/2021=Able to stand with varying ability (anywhere from min/mod/max A) - last visit able to stand pivot transfer and move right LE as previously unable.    Time 12    Period Weeks    Status On-going    Target Date 09/16/21      PT LONG TERM GOAL #5   Title The patient will demonstrate sitting without UE support for 2-5 minutes at EOB to improve participation and maximize independence with ADLs.    Baseline 10/22/19: Pt able to sit unsupported for at least 2 minutes at edge of bed. 04/21/2020- Patient continues to demo good  sitting at edge of mat > 5 miin without difficulty or back support.    Time 12    Period Weeks    Status Achieved      Additional Long Term Goals   Additional Long Term Goals Yes      PT LONG TERM GOAL #6   Title Patient will demonstrate improved functional LE strength as seen by consistent ability to stand > 2 min (3 out of 4 trials) with Mod/Max A for improved LE strength with transfers.    Baseline 05/28/2020- Patient able to inconsistent stand 1-2 min right now in //bars with Mod/Max A. 07/02/2020- Patient was able to progress last 2 visit to standing using bilateral platform attachment between 1-2 min which is a progression from the parallel bars. 08/20/2020- Patient performed static standing with min- mod A using bilateral platform attachment on walker for 6 min 30 sec today with assist for trunk control and intermittent verbal cues for posture. 09/24/2020- Patient has consistently been able to stand >2 min in // bars and when using bilateral Platform walker. 12/23/2020= Will reopen this goal as patient should transition back to weightbearing and again practice standing in new cert. 03/30/2021=1) 1 min 23 sec   2)  55mn 40 sec  3) 1 min 50 sec   Left LE hyperextended with increased difficulty tucking/extending hips today. Requiring more mod assist to remain standing. 06/24/2021=Patient was able to stand 5 trials and able to stand > 2 min for 1 of the 5 trials. 1) 1 min 40 sec 2) 2 min 38 sec 3) 38 sec (difficulty adjusting in standing and reported left LE discomfort 4) 1 min 28 sec 5) 1 min 35 sec  -rest break between each trial. However- Patient did demonstrate less overall UE support and more erect posture with ability to maintain extended hips over feet as previously much more difficult to maintain.    Time 12    Period Weeks    Status On-going    Target Date 09/15/21      PT LONG TERM GOAL #7   Title Patient/caregiver will demonstrate assisted transfer using [place] Lift consistently Independently  for improved ability to transfer at home from lift chair to bed.    Baseline 09/24/2020- Patient requires hoyer lift (Dependent) for safe transfers.  12/17/2020- Patient currently still dependent on hoyer as she is currently  non-weight bearing due to recent ankle fracture. 03/30/2021- Patient stated she has not tried. Agreeable to attempt in near future. 06/24/2021= Patient continues to rely on hoyer transfers at home.    Time 12    Period Weeks    Status Deferred    Target Date 06/22/21      PT LONG TERM GOAL #8   Title Patient will demo improved right LE quad strength as seen by ability to extend knee to 20 deg from 0 or less while performing a Long arc quad (for improved terminal knee ext and more full ROM)  for improved ability to bearing weight with Right LE.    Baseline 12/22/2020=able to extend knee to approx 35 deg from zero during long arc quad; 2/27/203- WIll reassess next visit; 5/232023=23 deg from zero    Time 12    Period Weeks    Status On-going    Target Date 09/16/21      PT LONG TERM GOAL  #9   TITLE Patient will ambulate > 100 feet using LiteGait exhibiting reciprocal steps to prepare for more weighted ambulation in future.    Baseline 06/24/2021- Patient recently on 06/22/2021 ambulated approx 75 feet with litegait - but visits before only approx 35 feet.    Time 12    Period Weeks    Status New    Target Date 09/16/21             Patient's condition has the potential to improve in response to therapy. Maximum improvement is yet to be obtained. The anticipated improvement is attainable and reasonable in a generally predictable time.        Patient will benefit from skilled therapeutic intervention in order to improve the following deficits and impairments:  Abnormal gait, Decreased balance, Decreased endurance, Decreased mobility, Difficulty walking, Hypomobility, Impaired sensation, Decreased range of motion, Improper body mechanics, Impaired perceived functional  ability, Decreased activity tolerance, Decreased knowledge of use of DME, Decreased safety awareness, Decreased strength, Impaired flexibility, Impaired UE functional use, Postural dysfunction  Visit Diagnosis: Abnormality of gait and mobility  Difficulty in walking, not elsewhere classified  Muscle weakness (generalized)  Other abnormalities of gait and mobility  Other lack of coordination  Unsteadiness on feet  Central cord syndrome, sequela ([facility])     Problem List Patient Active Problem List   Diagnosis Date Noted   Acute appendicitis with appendiceal abscess 05/30/2018   Hypocalcemia 02/15/2018   Dysuria 02/15/2018   Bilateral lower extremity edema 02/11/2018   Vaginal yeast infection 01/30/2018   Chest tightness 01/27/2018   Healthcare-associated pneumonia 01/25/2018   Pleural effusion, not elsewhere classified 01/25/2018   Acute deep vein thrombosis (DVT) of left lower extremity ([place]) 01/24/2018   Acute deep vein thrombosis (DVT) of right lower extremity ([place]) 01/24/2018   Chronic allergic rhinitis 01/24/2018   Depression with anxiety 01/24/2018   UTI due to Klebsiella species 01/24/2018   Protein-calorie malnutrition, severe ([place]) 01/24/2018   S/P insertion of IVC (inferior vena caval) filter 01/24/2018   Reactive depression    Hypokalemia    Leukocytosis    Essential hypertension    Trauma    Tetraparesis ([facility])    Neuropathic pain    Neurogenic bowel    Neurogenic bladder    Benign essential HTN    Acute blood loss anemia    Central cord syndrome at C6 level of cervical spinal cord ([place]) 11/29/2017   Central cord syndrome ([place]) 11/29/2017    Lewis Moccasin, PT 07/06/2021,  8:19 AM  [place] MAIN [facility] SERVICES [address] [place], [place], [address] Phone: [phone]   Fax:  [phone]  Name: DARNELL STIMSON MRN: [id number] Date of Birth: 06-13-1934

## 2021-05-18 NOTE — Therapy (Signed)
[place] ?[place] MAIN REHAB SERVICES ?[place], [place], [address] ?Phone: [phone]   Fax:  [phone] ? ?Occupational Therapy Treatment ? ?Patient Details  ?Name: Sherry [name] ?MRN: [id number] ?Date of Birth: 28-Aug-1934 ?No data recorded ? ?Encounter Date: 05/18/2021 ? ? OT End of Session - 05/18/21 1303   ? ? Visit Number [id number]   ? Number of Visits 154   ? Date for OT Re-Evaluation 07/01/21   ? Authorization Time Period Progress report period starting 11/26/2020   ? OT Start Time 1100   ? OT Stop Time 1145   ? OT Time Calculation (min) 45 min   ? Activity Tolerance Patient tolerated treatment well   ? Behavior During Therapy [facility] for tasks assessed/performed   ? ?  ?  ? ?  ? ? ?Past Medical History:  ?Diagnosis Date  ? Acute blood loss anemia   ? Arthritis   ? Cancer [facility])   ? skin  ? Central cord syndrome at C6 level of cervical spinal cord ([place]) 11/29/2017  ? Hypertension   ? Protein-calorie malnutrition, severe ([place]) 01/24/2018  ? S/P insertion of IVC (inferior vena caval) filter 01/24/2018  ? Tetraparesis ([place])   ? ? ?Past Surgical History:  ?Procedure Laterality Date  ? ANTERIOR CERVICAL DECOMP/DISCECTOMY FUSION N/A 11/29/2017  ? Procedure: Cervical five-six, six-seven Anterior Cervical Decompression Fusion;  Surgeon: Judith Part, MD;  Location: [place];  Service: Neurosurgery;  Laterality: N/A;  Cervical five-six, six-seven Anterior Cervical Decompression Fusion  ? CATARACT EXTRACTION    ? IR IVC FILTER PLMT / S&I /IMG GUID/MOD SED  12/08/2017  ? ? ?There were no vitals filed for this visit. ? ? Subjective Assessment - 05/18/21 1303   ? ? Subjective  Pt. reports doing well today   ? Patient is accompanied by: Family member   ? Pertinent History Patient s/p fall November 29, 2017 resulting in diagnosis of central cord syndrome at C 6 level.  She has had therapy in multiple venues but with recent move to [facility], therapy staff recommended she seek  outpatient therapy for her needs.   ? Currently in Pain? No/denies   ? ?  ?  ? ?  ?  ?OT TREATMENT   ?   ?Therapeutic Ex. ?  ?Pt. tolerated PROM bilateral wrist extension, digit MP, PIP, and DIP flexion, and extension, thumb radial, and palmar abduction in conjunction with moist heat. Emphasis was placed on left wrist extension, bilateral thumb radial, and palmar abduction, and bilateral digit MP, PIP, and DIP flexion. Pt. worked on [employer], and reciprocal motion using the UBE while sitting for 8 min. with minimal resistance. ?  ?Manual Therapy: ?  ?Pt. Tolerated soft tissue massage to the volar, and dorsal aspect of the 2nd through 5th digits for stiffness independent of, and in preparation for therapeutic Ex.  ?  ?Pt. continues to present with bilateral wrist, and digit tightness, and continues to work towards improving with reaching across midline. Pt. tolerated manual therapy well. Pt. Continues to tolerate the UBE without difficulty with increasing resistance. Pt. tolerated ROM well without reports of pain, or discomfort following moist heat modality. Pt. Presents with tightness at the bilateral thumb webspaces. Pt. continues to present with bilateral wrist flexor tightness, and digit MP, PIP, and DIP extensor tightness. Pt. continues to benefit from working on impoving ROM in order to work towards increasing engagement of her bilateral hands during ADLs, and IADL tasks.  ? ? ? ? ? ? ? ? ? ? ? ? ? ? ? ? ? ? ?  OT Education - 05/18/21 1303   ? ? Education Details BUE functioning, and strengthening   ? Methods Explanation;Demonstration;Verbal cues   ? Comprehension Verbalized understanding;Need further instruction;Verbal cues required   ? ?  ?  ? ?  ? ? ? ? ? ? OT Long Term Goals - 04/13/21 1113   ? ?  ? OT LONG TERM GOAL #1  ? Title Patient and caregiver will demonstrate understanding of home exercise program for ROM.   ? Baseline Pt. has recently been in quarantine for COVID-19. Pt.'s current  restorative aide is retiring at the end of December. Pt. continues to have a restorative aide rehab aide assist her wih exercises at [facility]. 8/8: Independently completes/directs HEP mulitple times/week. 12/17/2020: Pt. has a new restorative aide who will be resuming ROM program.   ? Time 12   ? Period Weeks   ? Status On-going   ?  ? OT LONG TERM GOAL #4  ? Title Patient will demonstrate improved composite finger flexion to hold deodorant to apply to underarms.   ? Baseline 01/12/2021:  Pt. is progressing, and is able to reach to grasp, and hold narrow cones. 10/20/2020: Pt. conitnues to make steady progress with bilateral digit flexion.Pt. continues to present with stiifness/tightness which is hindering pt.'s ability to formulate a full composite fist. Pt. continues to present with improving finger flexion of R hand for a partial gross grasp, but unable to grasp and hold items. pt. conitnues to present with limited digit flexion of L hand. Pt. has active left thumb abduction. 8/8: holds cup with 2 hands, continues to have limited composite finger flexion L worse than R. 02/16/2021: pt. continues to present with limited bilateral gross composite fisting. 04/08/2021: pt. continues to present with limited digit flexion affecting her ability to formulate bilateral composite fisting. 110th visivt: Pt. is able to hold the utensils in preparation for self-feeding.   ? Time 12   ? Period Weeks   ? Status On-going   ? Target Date 07/01/21   ?  ? OT LONG TERM GOAL #6  ? Title Patient will increase right UE ROM to comb the back of her hair with modified independence.   ? Baseline 01/12/2022: Pt. is able to comb her hair, reaching to the back of her hair.. 10/20/2020: Pt. continues to progress wtith RRUE ROM, and is able to use a pic for the back of her head, however is unable to brush the back of her hair. 07/30/2020: Pt. is now able to comb the back of her hair with a pic, however continues to work on completing the task  efficiently. 8/8: Achieves with great effort, has assistance for efficiancy. 02/16/2021: Pt. is now able to reach, and brush the back of her hair. 04/08/2021: pt. continues to improve reach towards the back of her hair. 110th visit: Pt. is to reach towards the back of her hair, however has difficulty sustaining her arms in elevaton for the entire duration of perfroming hair cre thoroughly.   ? Time 12   ? Period Weeks   ? Status Partially Met   ? Target Date 07/01/21   ?  ? OT LONG TERM GOAL #8  ? Title Pt. will write, and sign her name with 100% legibility, and modified independence.   ? Baseline 01/12/2021: Pt. continnues to fill out her daily menu and complete puzzles. 10/20/2020: Pt. continues to make progress overall. Pt. continues to consistently fill out her daily menu, and writing for crossword puzzles.  Pt. is able to maintain grasp on a wide width pen. [place]ine[place] to work on [employer]. 8/8: writes daily, continues to work on improving speed. 02/16/2021: Pt. continues to present wiht limited writing legibility, fluctuating daily. 04/08/2021: Pt. continues to to be able to fill out her daily menu.limited writing legibility for daily puzzles.110th visit: Pt. continues to present with limited legibility, and efficiency for completing daily puzzles.   ? Time 12   ? Period Weeks   ? Status Partially Met   ? Target Date 07/01/21   ? ?  ?  ? ?  ? ? ? ? ? ? ? ? Plan - 05/18/21 1304   ? ? Plan ? ? ? ?    ? ? ? ? ? ? ? ? ? ?OT Occupational Profile and History Pt. continues to present with bilateral wrist, and digit tightness, and continues to work towards improving with reaching across midline. Pt. tolerated manual therapy well. Pt. Continues to tolerate the UBE without difficulty with increasing resistance. Pt. tolerated ROM well without reports of pain, or discomfort following moist heat modality. Pt. Presents with tightness at the bilateral thumb webspaces. Pt. continues to present with bilateral wrist flexor  tightness, and digit MP, PIP, and DIP extensor tightness. Pt. continues to benefit from working on impoving ROM in order to work towards increasing engagement of her bilateral hands during ADLs, and IADL tasks.  ? ?Detailed

## 2021-05-20 ENCOUNTER — Ambulatory Visit: Payer: Medicare HMO | Admitting: Occupational Therapy

## 2021-05-20 ENCOUNTER — Encounter: Payer: Self-pay | Admitting: Occupational Therapy

## 2021-05-20 ENCOUNTER — Ambulatory Visit: Payer: Medicare HMO

## 2021-05-20 DIAGNOSIS — M6281 Muscle weakness (generalized): Secondary | ICD-10-CM | POA: Diagnosis not present

## 2021-05-20 DIAGNOSIS — R269 Unspecified abnormalities of gait and mobility: Secondary | ICD-10-CM

## 2021-05-20 DIAGNOSIS — S14129S Central cord syndrome at unspecified level of cervical spinal cord, sequela: Secondary | ICD-10-CM

## 2021-05-20 DIAGNOSIS — R2689 Other abnormalities of gait and mobility: Secondary | ICD-10-CM

## 2021-05-20 DIAGNOSIS — R2681 Unsteadiness on feet: Secondary | ICD-10-CM

## 2021-05-20 DIAGNOSIS — R262 Difficulty in walking, not elsewhere classified: Secondary | ICD-10-CM

## 2021-05-20 DIAGNOSIS — R278 Other lack of coordination: Secondary | ICD-10-CM

## 2021-05-20 NOTE — Therapy (Signed)
[place] [facility] ?[place], [place], [address] ?Phone: [phone]   Fax:  [phone] ? ?Occupational Therapy Treatment ? ?Patient Details  ?Name: Sherry [name] ?MRN: [id number] ?Date of Birth: [birth year] ?No data recorded ? ?Encounter Date: 05/20/2021 ? ? OT End of Session - 05/20/21 1415   ? ? Visit Number [id number]   ? Number of Visits 154   ? Date for OT Re-Evaluation 07/01/21   ? Authorization Time Period Progress report period starting 11/26/2020   ? OT Start Time 1100   ? OT Stop Time 1145   ? OT Time Calculation (min) 45 min   ? Activity Tolerance Patient tolerated treatment well   ? Behavior During Therapy [facility] for tasks assessed/performed   ? ?  ?  ? ?  ? ? ?Past Medical History:  ?Diagnosis Date  ? Acute blood loss anemia   ? Arthritis   ? Cancer [facility])   ? skin  ? Central cord syndrome at C6 level of cervical spinal cord ([place]) 11/29/2017  ? Hypertension   ? Protein-calorie malnutrition, severe ([place]) 01/24/2018  ? S/P insertion of IVC (inferior vena caval) filter 01/24/2018  ? Tetraparesis ([place])   ? ? ?Past Surgical History:  ?Procedure Laterality Date  ? ANTERIOR CERVICAL DECOMP/DISCECTOMY FUSION N/A 11/29/2017  ? Procedure: Cervical five-six, six-seven Anterior Cervical Decompression Fusion;  Surgeon: Judith Part, MD;  Location: [place];  Service: Neurosurgery;  Laterality: N/A;  Cervical five-six, six-seven Anterior Cervical Decompression Fusion  ? CATARACT EXTRACTION    ? IR IVC FILTER PLMT / S&I /IMG GUID/MOD SED  12/08/2017  ? ? ?There were no vitals filed for this visit. ? ? Subjective Assessment - 05/20/21 1414   ? ? Subjective  Pt. reports doing well today   ? Patient is accompanied by: Family member   ? Pertinent History Patient s/p fall November 29, 2017 resulting in diagnosis of central cord syndrome at C 6 level.  She has had therapy in multiple venues but with recent move to [facility], therapy staff recommended she seek  outpatient therapy for her needs.   ? Currently in Pain? No/denies   ? ?  ?  ? ?  ? ?OT TREATMENT   ?   ?Therapeutic Ex. ?  ?Pt. tolerated PROM bilateral wrist extension, digit MP, PIP, and DIP flexion, and extension, thumb radial, and palmar abduction in conjunction with moist heat. Emphasis was placed on left wrist extension, bilateral thumb radial, and palmar abduction, and bilateral digit MP, PIP, and DIP flexion. Pt. worked on [employer], and reciprocal motion using the UBE while sitting for 8 min. with minimal resistance. ?  ?Manual Therapy: ?  ?Pt. Tolerated soft tissue massage to the volar, and dorsal aspect of the 2nd through 5th digits for stiffness independent of, and in preparation for therapeutic Ex.  ?  ?Pt. reports feeling more fatigued today. Pt. continues to present with bilateral wrist, and digit tightness, and continues to work towards improving with reaching across midline. Pt. tolerated manual therapy well. Pt. Continues to tolerate the UBE without difficulty with increasing resistance. Pt. tolerated ROM well without reports of pain, or discomfort following moist heat modality. Pt. Presents with tightness at the bilateral thumb webspaces. Pt. continues to present with bilateral wrist flexor tightness, and digit MP, PIP, and DIP extensor tightness. Pt. continues to benefit from working on impoving ROM in order to work towards increasing engagement of her bilateral hands during ADLs,  and IADL tasks.  ?  ?  ?  ? ? ? ? ? ? ? ? ? ? ? ? ? ? ? ? ? ? ? ? ? OT Education - 05/20/21 1414   ? ? Education Details BUE functioning, and strengthening   ? Person(s) Educated Associate Professor)   ? Methods Explanation;Demonstration;Verbal cues   ? Comprehension Verbalized understanding;Need further instruction;Verbal cues required   ? ?  ?  ? ?  ? ? ? ? ? ? OT Long Term Goals - 04/13/21 1113   ? ?  ? OT LONG TERM GOAL #1  ? Title Patient and caregiver will demonstrate understanding of home exercise  program for ROM.   ? Baseline Pt. has recently been in quarantine for COVID-19. Pt.'s current restorative aide is retiring at the end of December. Pt. continues to have a restorative aide rehab aide assist her wih exercises at [facility] [employer]. 8/8: Independently completes/directs HEP mulitple times/week. 12/17/2020: Pt. has a new restorative aide who will be resuming ROM program.   ? Time 12   ? Period Weeks   ? Status On-going   ?  ? OT LONG TERM GOAL #4  ? Title Patient will demonstrate improved composite finger flexion to hold deodorant to apply to underarms.   ? Baseline 01/12/2021:  Pt. is progressing, and is able to reach to grasp, and hold narrow cones. 10/20/2020: Pt. conitnues to make steady progress with bilateral digit flexion.Pt. continues to present with stiifness/tightness which is hindering pt.'s ability to formulate a full composite fist. Pt. continues to present with improving finger flexion of R hand for a partial gross grasp, but unable to grasp and hold items. pt. conitnues to present with limited digit flexion of L hand. Pt. has active left thumb abduction. 8/8: holds cup with 2 hands, continues to have limited composite finger flexion L worse than R. 02/16/2021: pt. continues to present with limited bilateral gross composite fisting. 04/08/2021: pt. continues to present with limited digit flexion affecting her ability to formulate bilateral composite fisting. 110th visivt: Pt. is able to hold the utensils in preparation for self-feeding.   ? Time 12   ? Period Weeks   ? Status On-going   ? Target Date 07/01/21   ?  ? OT LONG TERM GOAL #6  ? Title Patient will increase right UE ROM to comb the back of her hair with modified independence.   ? Baseline 01/12/2022: Pt. is able to comb her hair, reaching to the back of her hair.. 10/20/2020: Pt. continues to progress wtith RRUE ROM, and is able to use a pic for the back of her head, however is unable to brush the back of her hair. 07/30/2020: Pt. is now able  to comb the back of her hair with a pic, however continues to work on completing the task efficiently. 8/8: Achieves with great effort, has assistance for efficiancy. 02/16/2021: Pt. is now able to reach, and brush the back of her hair. 04/08/2021: pt. continues to improve reach towards the back of her hair. 110th visit: Pt. is to reach towards the back of her hair, however has difficulty sustaining her arms in elevaton for the entire duration of perfroming hair cre thoroughly.   ? Time 12   ? Period Weeks   ? Status Partially Met   ? Target Date 07/01/21   ?  ? OT LONG TERM GOAL #8  ? Title Pt. will write, and sign her name with 100% legibility, and modified independence.   ?  Baseline 01/12/2021: Pt. continnues to fill out her daily menu and complete puzzles. 10/20/2020: Pt. continues to make progress overall. Pt. continues to consistently fill out her daily menu, and writing for crossword puzzles. Pt. is able to maintain grasp on a wide width pen. [place] to work on [employer]. 8/8: writes daily, continues to work on improving speed. 02/16/2021: Pt. continues to present wiht limited writing legibility, fluctuating daily. 04/08/2021: Pt. continues to to be able to fill out her daily menu.limited writing legibility for daily puzzles.110th visit: Pt. continues to present with limited legibility, and efficiency for completing daily puzzles.   ? Time 12   ? Period Weeks   ? Status Partially Met   ? Target Date 07/01/21   ? ?  ?  ? ?  ? ? ? ? ? ? ? ? Plan - 05/20/21 1415   ? ? Clinical Impression Statement Pt. reports feeling more fatigued today. Pt. continues to present with bilateral wrist, and digit tightness, and continues to work towards improving with reaching across midline. Pt. tolerated manual therapy well. Pt. Continues to tolerate the UBE without difficulty with increasing resistance. Pt. tolerated ROM well without reports of pain, or discomfort following moist heat modality. Pt. Presents with tightness  at the bilateral thumb webspaces. Pt. continues to present with bilateral wrist flexor tightness, and digit MP, PIP, and DIP extensor tightness. Pt. continues to benefit from working on impoving ROM in order to wo

## 2021-05-21 NOTE — Therapy (Signed)
[facility] ?[place], [place], [address] ?Phone: [phone]   Fax:  [phone] ? ?Physical Therapy Treatment ? ?Patient Details  ?Name: Sherry [name] ?MRN: [id number] ?Date of Birth: November 09, 1934 ?No data recorded ? ?Encounter Date: 05/20/2021 ? ? PT End of Session - 05/21/21 1001   ? ? Visit Number [id number]   ? Number of Visits 153   ? Date for PT Re-Evaluation 06/22/21   ? Authorization Type [employer] FOTO performed by PT on eval (7/20), score 12, Progress note on 03/10/2020; PN on 08/20/2020   ? Authorization Time Period Recert 04/08/3336- 04/30/1914; PN on 6/0/6004; Recert 5/99/7741- 42/39/5320; PN on 2/33/4356; Recert 86/16/8372- 10/03/1113; Recert 06/20/8020-3/36/1224   ? PT Start Time 1145   ? PT Stop Time 1223   ? PT Time Calculation (min) 38 min   ? Equipment Utilized During Treatment Gait belt   ? Activity Tolerance Patient tolerated treatment well;Patient limited by fatigue   ? Behavior During Therapy [facility] for tasks assessed/performed   ? ?  ?  ? ?  ? ? ?Past Medical History:  ?Diagnosis Date  ? Acute blood loss anemia   ? Arthritis   ? Cancer [facility])   ? skin  ? Central cord syndrome at C6 level of cervical spinal cord ([place]) 11/29/2017  ? Hypertension   ? Protein-calorie malnutrition, severe ([place]) 01/24/2018  ? S/P insertion of IVC (inferior vena caval) filter 01/24/2018  ? Tetraparesis ([employer])   ? ? ?Past Surgical History:  ?Procedure Laterality Date  ? ANTERIOR CERVICAL DECOMP/DISCECTOMY FUSION N/A 11/29/2017  ? Procedure: Cervical five-six, six-seven Anterior Cervical Decompression Fusion;  Surgeon: Judith Part, MD;  Location: [place];  Service: Neurosurgery;  Laterality: N/A;  Cervical five-six, six-seven Anterior Cervical Decompression Fusion  ? CATARACT EXTRACTION    ? IR IVC FILTER PLMT / S&I /IMG GUID/MOD SED  12/08/2017  ? ? ?There were no vitals filed for this visit. ? ? Subjective Assessment - 05/20/21 1000   ? ? Subjective  Patient denied any pain or issues from last visit.   ? Pertinent History Pt is an 87 yo female that fell in 2019, fractured vertebrae in her neck and in her low back per family. 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 After hospital stay, pt discharged to [facility] for ~1 month, experienced severe UTI as well as bilateral DVT (IVC filter placed). Discharged to [facility], stayed for about a year, and then moved to [facility] in April 2021. Was receiving PT, but per family facility reported that did not have adequate equipment to maximize PT for her. Pt until about 1 month ago was practicing sit to stand transfers with 1-2 people, and working on static standing. Has a brace for L foot due to PF. Pt currently needs assistance with all ADLs (able to assist with donning/doffing her shirt), bed baths, and needs a hoyer lift for transfers. Able to drive power wheelchair without assistance.   ? Limitations Standing;Walking;House hold activities;Lifting   ? How long can you sit comfortably? no issues   ? How long can you stand comfortably? unable   ? How long can you walk comfortably? unable   ? Patient Stated Goals "to stand up and walk"   ? Currently in Pain? No/denies   ? ?  ?  ? ?  ? ? ? ?INTERVENTIONS:  ?  ?  ?Therapeutic Activities:  ?  ?Patient performed stand pivot transfer from power  w/c to mat table- Mod assist to stand and pivot today over to table.  ? ?Later in visit- while using Litegait- Patient practiced turning/pivoting while unweighed and able to pivot both from right and later to left with adequate control of each LE. ?  ?Gait training:  Using Litegait unweighing system  ?Patient was able to stand with use of lift and no issues with padding/harness.  She  ambulated in clinic approx 40 feet - Focusing on ability to advance each LE. She did exhibit ability to perform slow step to gait with overall less scissoring with left  and only 1 standing rest break.  ?  ?Education provided throughout  session via VC/TC and demonstration to facilitate movement at target joints and correct muscle activation for all testing and exercises performed. ? ? ? ? ? ? ? ? ? ? ? ? ? ? ? ? ? ? ? ? ? ? ? ? PT Education - 05/21/21 1001   ? ? Education Details gait cues with walking using litegait   ? Person(s) Educated Patient   ? Methods Explanation;Demonstration;Tactile cues;Verbal cues   ? Comprehension Verbalized understanding;Returned demonstration;Verbal cues required;Tactile cues required;Need further instruction   ? ?  ?  ? ?  ? ? ? PT Short Term Goals - 03/10/20 2211   ? ?  ? PT SHORT TERM GOAL #1  ? Title The patient will perform initial HEP with minimum assistance in order to improve strength and function.   ? Baseline Patient demonstrating independence with initial HEP as of 03/10/2020 with no questions or difficulty.   ? Time 4   ? Period Weeks   ? Status Achieved   ? Target Date 02/11/20   ? ?  ?  ? ?  ? ? ? ? PT Long Term Goals - 03/30/21 1150   ? ?  ? PT LONG TERM GOAL #1  ? Title The patient will be compliant with finalized HEP with minimum assistance in preparation for self management and maintenance of condition.   ? Baseline 03/10/2020- Patient familiar with initial HEP and will keep goal active as HEP becomes more progressive including possible transfers and standing activities. 04/07/2020- Patient continues to report compliance with LE strengthening home exercises without questions or concerns. 04/21/2020- Patient able to verbalize and demonstrate good understanding of current HEP including some supine and seated LE strengthening exercises. Reviewed and patient performed 10 reps today with minimal cueing. Will keep goal active to progress HEP as appropriate. 05/28/2020- patient reports continues to perform LE strengthening as able and some help from caregiver as able. No changes at this time to home program as patient is limited to supine/seated therex. 07/02/2020- Patient continues to report compliance with  current supine and seated LE strengtheing home program and states no questions or concerns regarding her plan.   ? Time 12   ? Period Weeks   ? Status Achieved   ?  ? PT LONG TERM GOAL #2  ? Title The patient will demonstrate at least 10 point improvement on FOTO score indicating an improved ability to perform functional activities.   ? Baseline on eval (/720) score 12; 10/22/19: 12; 12/10/19: 18, 12/13: 18: 04/07/2020- Will assess next visit. 07/02/2020- Will obtain next visit. 09/24/2020= Will obtain next visit. 11/12/2020= 12; 12/22/2020= 12; 03/30/2021= 17   ? Time 12   ? Period Weeks   ? Status On-going   ? Target Date 06/22/21   ?  ? PT LONG TERM GOAL #3  ?  Title The patient will demonstrate lateral scooting  for assistance with transfers with minA to maximize independence.   ? Baseline 10/22/19: Pt requires modA+1 for lateral scooting; 12/10/19: modA+1, 12/13: modA-maxA, 12/27: modA-maxA, 03/10/2020- ModA-MaxA- increased verbal cues and visual demonstration. 04/07/2020- Continued Max assist to perform forward/lateral scoot. 04/21/2020- Patient was able to demo slight lateral scoot with CGA approx 12 in then fatigued requiring max assist- she is able to scoot forward by leaning without significant issues. 05/28/2020- While sitting in chair- patient able to perform forward and lateral scoot with VC/visual demo and increased time allowed- CGA today but not consistent yet- will keep goal active at this time to continue to focus on strengthening and improving technique. 07/02/2020- Patient able to demonstrate minimal ability to laterally shift weigh on mat table requiring max assist yet is able to forward scoot out to edge of mat table with min Assist. 08/20/2020 - Patient was able to demonstrate minimal lateral scooting at edge of mat while holding feet still so they did not slip on tile surface. She continues to be able to demonstrate scoot forward and uses tilt option to recline to position safely back into seat of power  wheelchair. 09/25/2020- Patient able to sit on mat and minimally scoot laterally but requires min/mod A for efficient scooting. Will end goal at this time due to plateau in progress with scooting over extended time.

## 2021-05-25 ENCOUNTER — Ambulatory Visit: Payer: Medicare HMO

## 2021-05-25 ENCOUNTER — Ambulatory Visit: Payer: Medicare HMO | Admitting: Occupational Therapy

## 2021-05-27 ENCOUNTER — Ambulatory Visit: Payer: Medicare HMO | Admitting: Occupational Therapy

## 2021-05-27 ENCOUNTER — Encounter: Payer: Self-pay | Admitting: Occupational Therapy

## 2021-05-27 ENCOUNTER — Ambulatory Visit: Payer: Medicare HMO

## 2021-05-27 DIAGNOSIS — R278 Other lack of coordination: Secondary | ICD-10-CM

## 2021-05-27 DIAGNOSIS — M6281 Muscle weakness (generalized): Secondary | ICD-10-CM | POA: Diagnosis not present

## 2021-05-27 DIAGNOSIS — S14129S Central cord syndrome at unspecified level of cervical spinal cord, sequela: Secondary | ICD-10-CM

## 2021-05-27 DIAGNOSIS — R262 Difficulty in walking, not elsewhere classified: Secondary | ICD-10-CM

## 2021-05-27 DIAGNOSIS — R269 Unspecified abnormalities of gait and mobility: Secondary | ICD-10-CM

## 2021-05-27 DIAGNOSIS — R2689 Other abnormalities of gait and mobility: Secondary | ICD-10-CM

## 2021-05-27 DIAGNOSIS — R2681 Unsteadiness on feet: Secondary | ICD-10-CM

## 2021-05-27 NOTE — Therapy (Signed)
[place] ?[place], [place], [address] ?Phone: [phone]   Fax:  [phone] ? ?Physical Therapy Treatment ? ?Patient Details  ?Name: Sherry [name] ?MRN: [id number] ?Date of Birth: 01/24/1935 ?No data recorded ? ?Encounter Date: 05/27/2021 ? ? PT End of Session - 05/27/21 1134   ? ? Visit Number [id number]   ? Number of Visits 153   ? Date for PT Re-Evaluation 06/22/21   ? Authorization Type [employer] FOTO performed by PT on eval (7/20), score 12, Progress note on 03/10/2020; PN on 08/20/2020   ? Authorization Time Period Recert 4/0/1027- 2/53/6644; PN on 0/04/4740; Recert 5/95/6387- 56/43/3295; PN on 1/88/4166; Recert 08/01/1599- 0/93/2355; Recert 7/32/2025-05/28/621   ? Progress Note Due on Visit 150   ? PT Start Time 1145   ? PT Stop Time 1218   ? PT Time Calculation (min) 33 min   ? Equipment Utilized During Treatment Gait belt   ? Activity Tolerance Patient tolerated treatment well;Patient limited by fatigue   ? Behavior During Therapy [facility] for tasks assessed/performed   ? ?  ?  ? ?  ? ? ?Past Medical History:  ?Diagnosis Date  ? Acute blood loss anemia   ? Arthritis   ? Cancer [facility])   ? skin  ? Central cord syndrome at C6 level of cervical spinal cord ([place]) 11/29/2017  ? Hypertension   ? Protein-calorie malnutrition, severe ([place]) 01/24/2018  ? S/P insertion of IVC (inferior vena caval) filter 01/24/2018  ? Tetraparesis ([place])   ? ? ?Past Surgical History:  ?Procedure Laterality Date  ? ANTERIOR CERVICAL DECOMP/DISCECTOMY FUSION N/A 11/29/2017  ? Procedure: Cervical five-six, six-seven Anterior Cervical Decompression Fusion;  Surgeon: Judith Part, MD;  Location: [facility];  Service: Neurosurgery;  Laterality: N/A;  Cervical five-six, six-seven Anterior Cervical Decompression Fusion  ? CATARACT EXTRACTION    ? IR IVC FILTER PLMT / S&I /IMG GUID/MOD SED  12/08/2017  ? ? ?There were no vitals filed for this visit. ? ? Subjective Assessment -  05/27/21 1133   ? ? Subjective Patien presents today with her daughter and eager to show her what she has been working on as far as walking.   ? Pertinent History Pt is an 87 yo female that fell in 2019, fractured vertebrae in her neck and in her low back per family. 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 After hospital stay, pt discharged to [facility] for ~1 month, experienced severe UTI as well as bilateral DVT (IVC filter placed). Discharged to [facility], stayed for about a year, and then moved to [facility] in April 2021. Was receiving PT, but per family facility reported that did not have adequate equipment to maximize PT for her. Pt until about 1 month ago was practicing sit to stand transfers with 1-2 people, and working on static standing. Has a brace for L foot due to PF. Pt currently needs assistance with all ADLs (able to assist with donning/doffing her shirt), bed baths, and needs a hoyer lift for transfers. Able to drive power wheelchair without assistance.   ? Limitations Standing;Walking;House hold activities;Lifting   ? How long can you sit comfortably? no issues   ? How long can you stand comfortably? unable   ? How long can you walk comfortably? unable   ? Patient Stated Goals "to stand up and walk"   ? Currently in Pain? No/denies   ? ?  ?  ? ?  ? ? ? ?  INTERVENTIONS:  ? ? ? ? ?Therapeutic Activities:  ?  ?Patient performed stand pivot transfer from power w/c to mat table- Mod- max assist to stand and pivot today over to table.  ? ?Patient was able to roll left and right to don the harness of litegait and then able to sit back up at edge of bed with max assist with LE and min assist with upper trunk.  ?  ?Patient later performed transfer from edge of mat back to power wheelchair with max assist due to fatigue.  ?  ?Gait training:  Using Litegait unweighing system  ?Patient was able to stand with use of lift and no issues with padding/harness.  She  ambulated in clinic approx 55  feet - VC for step length and mild difficulty with turning. Initially she was able to walk some with less unweighing but fatigued quickly requiring increased unweightingability to advance each LE. She did exhibit ability to perform slow step to gait with overall less scissoring with left  and 2 standing rest breaks.  ?  ?Education provided throughout session via VC/TC and demonstration to facilitate movement at target joints and correct muscle activation for all testing and exercises performed. ?  ? ? ? ? ? ? ? ? ? ? ? ? ? ? ? ? ? ? ? PT Education - 05/27/21 1133   ? ? Education Details Gait cues with walking using litegait   ? Person(s) Educated Patient   ? Methods Explanation;Demonstration;Tactile cues;Verbal cues   ? Comprehension Verbalized understanding;Tactile cues required;Verbal cues required;Returned demonstration;Need further instruction   ? ?  ?  ? ?  ? ? ? PT Short Term Goals - 03/10/20 2211   ? ?  ? PT SHORT TERM GOAL #1  ? Title The patient will perform initial HEP with minimum assistance in order to improve strength and function.   ? Baseline Patient demonstrating independence with initial HEP as of 03/10/2020 with no questions or difficulty.   ? Time 4   ? Period Weeks   ? Status Achieved   ? Target Date 02/11/20   ? ?  ?  ? ?  ? ? ? ? PT Long Term Goals - 03/30/21 1150   ? ?  ? PT LONG TERM GOAL #1  ? Title The patient will be compliant with finalized HEP with minimum assistance in preparation for self management and maintenance of condition.   ? Baseline 03/10/2020- Patient familiar with initial HEP and will keep goal active as HEP becomes more progressive including possible transfers and standing activities. 04/07/2020- Patient continues to report compliance with LE strengthening home exercises without questions or concerns. 04/21/2020- Patient able to verbalize and demonstrate good understanding of current HEP including some supine and seated LE strengthening exercises. Reviewed and patient  performed 10 reps today with minimal cueing. Will keep goal active to progress HEP as appropriate. 05/28/2020- patient reports continues to perform LE strengthening as able and some help from caregiver as able. No changes at this time to home program as patient is limited to supine/seated therex. 07/02/2020- Patient continues to report compliance with current supine and seated LE strengtheing home program and states no questions or concerns regarding her plan.   ? Time 12   ? Period Weeks   ? Status Achieved   ?  ? PT LONG TERM GOAL #2  ? Title The patient will demonstrate at least 10 point improvement on FOTO score indicating an improved ability to perform functional activities.   ?  Baseline on eval (/720) score 12; 10/22/19: 12; 12/10/19: 18, 12/13: 18: 04/07/2020- Will assess next visit. 07/02/2020- Will obtain next visit. 09/24/2020= Will obtain next visit. 11/12/2020= 12; 12/22/2020= 12; 03/30/2021= 17   ? Time 12   ? Period Weeks   ? Status On-going   ? Target Date 06/22/21   ?  ? PT LONG TERM GOAL #3  ? Title The patient will demonstrate lateral scooting  for assistance with transfers with minA to maximize independence.   ? Baseline 10/22/19: Pt requires modA+1 for lateral scooting; 12/10/19: modA+1, 12/13: modA-maxA, 12/27: modA-maxA, 03/10/2020- ModA-MaxA- increased verbal cues and visual demonstration. 04/07/2020- Continued Max assist to perform forward/lateral scoot. 04/21/2020- Patient was able to demo slight lateral scoot with CGA approx 12 in then fatigued requiring max assist- she is able to scoot forward by leaning without significant issues. 05/28/2020- While sitting in chair- patient able to perform forward and lateral scoot with VC/visual demo and increased time allowed- CGA today but not consistent yet- will keep goal active at this time to continue to focus on strengthening and improving technique. 07/02/2020- Patient able to demonstrate minimal ability to laterally shift weigh on mat table requiring max assist  yet is able to forward scoot out to edge of mat table with min Assist. 08/20/2020 - Patient was able to demonstrate minimal lateral scooting at edge of mat while holding feet still so they did not slip on tile sur

## 2021-05-27 NOTE — Therapy (Signed)
[place] [facility] ?[place], [place], [address] ?Phone: [phone]   Fax:  [phone] ? ?Occupational Therapy Treatment ? ?Patient Details  ?Name: Sherry [name] ?MRN: [id number] ?Date of Birth: 10/27/1934 ?No data recorded ? ?Encounter Date: 05/27/2021 ? ? OT End of Session - 05/27/21 1152   ? ? Visit Number [id number]   ? Number of Visits 154   ? Date for OT Re-Evaluation 07/01/21   ? Authorization Time Period Progress report period starting 11/26/2020   ? OT Start Time 1100   ? OT Stop Time 1145   ? OT Time Calculation (min) 45 min   ? Equipment Utilized During Treatment moist heat   ? Activity Tolerance Patient tolerated treatment well   ? Behavior During Therapy [facility] for tasks assessed/performed   ? ?  ?  ? ?  ? ? ?Past Medical History:  ?Diagnosis Date  ? Acute blood loss anemia   ? Arthritis   ? Cancer [facility])   ? skin  ? Central cord syndrome at C6 level of cervical spinal cord ([place]) 11/29/2017  ? Hypertension   ? Protein-calorie malnutrition, severe ([place]) 01/24/2018  ? S/P insertion of IVC (inferior vena caval) filter 01/24/2018  ? Tetraparesis ([place])   ? ? ?Past Surgical History:  ?Procedure Laterality Date  ? ANTERIOR CERVICAL DECOMP/DISCECTOMY FUSION N/A 11/29/2017  ? Procedure: Cervical five-six, six-seven Anterior Cervical Decompression Fusion;  Surgeon: Judith Part, MD;  Location: [facility];  Service: Neurosurgery;  Laterality: N/A;  Cervical five-six, six-seven Anterior Cervical Decompression Fusion  ? CATARACT EXTRACTION    ? IR IVC FILTER PLMT / S&I /IMG GUID/MOD SED  12/08/2017  ? ? ?There were no vitals filed for this visit. ? ? Subjective Assessment - 05/27/21 1152   ? ? Subjective  Pt. reports doing well today   ? Patient is accompanied by: Family member   ? Pertinent History Patient s/p fall November 29, 2017 resulting in diagnosis of central cord syndrome at C 6 level.  She has had therapy in multiple venues but with recent move to  [facility], therapy staff recommended she seek outpatient therapy for her needs.   ? Currently in Pain? No/denies   ? ?  ?  ? ?  ? ?OT TREATMENT   ?   ?Therapeutic Ex. ?  ?Pt. tolerated PROM bilateral wrist extension, digit MP, PIP, and DIP flexion, and extension, thumb radial, and palmar abduction in conjunction with moist heat. Emphasis was placed on left wrist extension, bilateral thumb radial, and palmar abduction, and bilateral digit MP, PIP, and DIP flexion. Pt. worked on [employer], and reciprocal motion using the UBE while sitting for 8 min. with minimal resistance. ?  ?Manual Therapy: ?  ?Pt. Tolerated soft tissue massage to the volar, and dorsal aspect of the 2nd through 5th digits for stiffness independent of, and in preparation for therapeutic Ex.  ?  ?Pt. reports that she missed her appointment on Monday to stay with her husband, and wait for the MD after he had a fall. Pt. continues to present with bilateral wrist, and digit tightness, and continues to work towards improving with reaching across midline. Pt. tolerated manual therapy well. Pt. Continues to tolerate the UBE without difficulty with increasing resistance. Pt. tolerated ROM well without reports of pain, or discomfort following moist heat modality. Pt. Presents with tightness at the bilateral thumb webspaces. Pt. continues to present with bilateral wrist flexor tightness, and digit MP,  PIP, and DIP extensor tightness. Pt. continues to benefit from working on impoving ROM in order to work towards increasing engagement of her bilateral hands during ADLs, and IADL tasks.  ?  ?  ?  ? ? ? ? ? ? ? ? ? ? ? ? ? ? ? ? ? ? ? ? ? OT Education - 05/27/21 1152   ? ? Education Details BUE functioning, and strengthening   ? Person(s) Educated Associate Professor)   ? Methods Explanation;Demonstration;Verbal cues   ? Comprehension Verbalized understanding;Need further instruction;Verbal cues required   ? ?  ?  ? ?  ? ? ? ? ? ? OT Long Term Goals -  04/13/21 1113   ? ?  ? OT LONG TERM GOAL #1  ? Title Patient and caregiver will demonstrate understanding of home exercise program for ROM.   ? Baseline Pt. has recently been in quarantine for COVID-19. Pt.'s current restorative aide is retiring at the end of December. Pt. continues to have a restorative aide rehab aide assist her wih exercises at [facility]. 8/8: Independently completes/directs HEP mulitple times/week. 12/17/2020: Pt. has a new restorative aide who will be resuming ROM program.   ? Time 12   ? Period Weeks   ? Status On-going   ?  ? OT LONG TERM GOAL #4  ? Title Patient will demonstrate improved composite finger flexion to hold deodorant to apply to underarms.   ? Baseline 01/12/2021:  Pt. is progressing, and is able to reach to grasp, and hold narrow cones. 10/20/2020: Pt. conitnues to make steady progress with bilateral digit flexion.Pt. continues to present with stiifness/tightness which is hindering pt.'s ability to formulate a full composite fist. Pt. continues to present with improving finger flexion of R hand for a partial gross grasp, but unable to grasp and hold items. pt. conitnues to present with limited digit flexion of L hand. Pt. has active left thumb abduction. 8/8: holds cup with 2 hands, continues to have limited composite finger flexion L worse than R. 02/16/2021: pt. continues to present with limited bilateral gross composite fisting. 04/08/2021: pt. continues to present with limited digit flexion affecting her ability to formulate bilateral composite fisting. 110th visivt: Pt. is able to hold the utensils in preparation for self-feeding.   ? Time 12   ? Period Weeks   ? Status On-going   ? Target Date 07/01/21   ?  ? OT LONG TERM GOAL #6  ? Title Patient will increase right UE ROM to comb the back of her hair with modified independence.   ? Baseline 01/12/2022: Pt. is able to comb her hair, reaching to the back of her hair.. 10/20/2020: Pt. continues to progress wtith RRUE ROM, and  is able to use a pic for the back of her head, however is unable to brush the back of her hair. 07/30/2020: Pt. is now able to comb the back of her hair with a pic, however continues to work on completing the task efficiently. 8/8: Achieves with great effort, has assistance for efficiancy. 02/16/2021: Pt. is now able to reach, and brush the back of her hair. 04/08/2021: pt. continues to improve reach towards the back of her hair. 110th visit: Pt. is to reach towards the back of her hair, however has difficulty sustaining her arms in elevaton for the entire duration of perfroming hair cre thoroughly.   ? Time 12   ? Period Weeks   ? Status Partially Met   ? Target Date  07/01/21   ?  ? OT LONG TERM GOAL #8  ? Title Pt. will write, and sign her name with 100% legibility, and modified independence.   ? Baseline 01/12/2021: Pt. continnues to fill out her daily menu and complete puzzles. 10/20/2020: Pt. continues to make progress overall. Pt. continues to consistently fill out her daily menu, and writing for crossword puzzles. Pt. is able to maintain grasp on a wide width pen. Contineus to work on [employer]. 8/8: writes daily, continues to work on improving speed. 02/16/2021: Pt. continues to present wiht limited writing legibility, fluctuating daily. 04/08/2021: Pt. continues to to be able to fill out her daily menu.limited writing legibility for daily puzzles.110th visit: Pt. continues to present with limited legibility, and efficiency for completing daily puzzles.   ? Time 12   ? Period Weeks   ? Status Partially Met   ? Target Date 07/01/21   ? ?  ?  ? ?  ? ? ? ? ? ? ? ? Plan - 05/27/21 1153   ? ? Clinical Impression Statement Pt. reports that she missed her appointment on Monday to stay with her husband, and wait for the MD after he had a fall. Pt. continues to present with bilateral wrist, and digit tightness, and continues to work towards improving with reaching across midline. Pt. tolerated manual therapy well.  Pt. Continues to tolerate the UBE without difficulty with increasing resistance. Pt. tolerated ROM well without reports of pain, or discomfort following moist heat modality. Pt. Presents with tightness at

## 2021-06-01 ENCOUNTER — Encounter: Payer: Self-pay | Admitting: Occupational Therapy

## 2021-06-01 ENCOUNTER — Ambulatory Visit: Payer: Medicare HMO

## 2021-06-01 ENCOUNTER — Ambulatory Visit: Payer: Medicare HMO | Attending: Internal Medicine | Admitting: Occupational Therapy

## 2021-06-01 DIAGNOSIS — R269 Unspecified abnormalities of gait and mobility: Secondary | ICD-10-CM | POA: Diagnosis present

## 2021-06-01 DIAGNOSIS — R2689 Other abnormalities of gait and mobility: Secondary | ICD-10-CM

## 2021-06-01 DIAGNOSIS — R2681 Unsteadiness on feet: Secondary | ICD-10-CM

## 2021-06-01 DIAGNOSIS — M6281 Muscle weakness (generalized): Secondary | ICD-10-CM

## 2021-06-01 DIAGNOSIS — R278 Other lack of coordination: Secondary | ICD-10-CM

## 2021-06-01 DIAGNOSIS — R262 Difficulty in walking, not elsewhere classified: Secondary | ICD-10-CM

## 2021-06-01 DIAGNOSIS — R140 Abdominal distension (gaseous): Secondary | ICD-10-CM | POA: Insufficient documentation

## 2021-06-01 DIAGNOSIS — S14129S Central cord syndrome at unspecified level of cervical spinal cord, sequela: Secondary | ICD-10-CM | POA: Diagnosis present

## 2021-06-01 NOTE — Therapy (Signed)
[place] ?[facility] ?[place], [place], [address] ?Phone: [phone]   Fax:  [phone] ? ?Occupational Therapy Progress Note ? ?Dates of reporting period  04/13/2021   to   06/01/2021  ? ?Patient Details  ?Name: Sherry [name] ?MRN: [id number] ?Date of Birth: [birth year] ?No data recorded ? ?Encounter Date: 06/01/2021 ? ? OT End of Session - 06/01/21 1744   ? ? Visit Number 120   ? Number of Visits 154   ? Date for OT Re-Evaluation 07/01/21   ? Authorization Time Period Progress report period starting 11/26/2020   ? OT Start Time 1100   ? OT Stop Time 1145   ? OT Time Calculation (min) 45 min   ? Activity Tolerance Patient tolerated treatment well   ? Behavior During Therapy [facility] for tasks assessed/performed   ? ?  ?  ? ?  ? ? ?Past Medical History:  ?Diagnosis Date  ? Acute blood loss anemia   ? Arthritis   ? Cancer [facility])   ? skin  ? Central cord syndrome at C6 level of cervical spinal cord ([place]) 11/29/2017  ? Hypertension   ? Protein-calorie malnutrition, severe ([place]) 01/24/2018  ? S/P insertion of IVC (inferior vena caval) filter 01/24/2018  ? Tetraparesis ([place])   ? ? ?Past Surgical History:  ?Procedure Laterality Date  ? ANTERIOR CERVICAL DECOMP/DISCECTOMY FUSION N/A 11/29/2017  ? Procedure: Cervical five-six, six-seven Anterior Cervical Decompression Fusion;  Surgeon: Judith Part, MD;  Location: [place];  Service: Neurosurgery;  Laterality: N/A;  Cervical five-six, six-seven Anterior Cervical Decompression Fusion  ? CATARACT EXTRACTION    ? IR IVC FILTER PLMT / S&I /IMG GUID/MOD SED  12/08/2017  ? ? ?There were no vitals filed for this visit. ? ? Subjective Assessment - 06/01/21 1742   ? ? Subjective  Pt. reports doing well today   ? Patient is accompanied by: Family member   ? Pertinent History Patient s/p fall November 29, 2017 resulting in diagnosis of central cord syndrome at C 6 level.  She has had therapy in multiple venues but with recent  move to [facility], therapy staff recommended she seek outpatient therapy for her needs.   ? Patient Stated Goals Patient would like to be able to move better in bed, stand and perform self care tasks.   ? Currently in Pain? No/denies   ? ?  ?  ? ?  ? ?OT TREATMENT   ?   ?Therapeutic Ex. ?  ?Pt. tolerated PROM bilateral wrist extension, digit MP, PIP, and DIP flexion, and extension, thumb radial, and palmar abduction in conjunction with moist heat. Emphasis was placed on left wrist extension, bilateral thumb radial, and palmar abduction, and bilateral digit MP, PIP, and DIP flexion. Pt. worked on [employer], and reciprocal motion using the UBE while sitting for 8 min. with minimal resistance. ?  ?Reviewed goals with the pt. Pt. is able to use her hands to perform self-feeding skills with standard utensils. Pt. continues to worked on improving BUE reach in order to be able to comb, and brush the back of her hair, perform self-feeding, formulate a functional fist, and improve writing legibility. Pt. continues to present with bilateral wrist, and digit tightness, and continues to work towards improving with reaching across midline. Pt. tolerated manual therapy well. Pt. continues to tolerate the UBE without difficulty with increasing resistance. Pt. tolerated ROM well without reports of pain, or discomfort following moist heat  modality. Pt. presents with tightness at the bilateral thumb webspaces. Pt. continues to present with bilateral wrist flexor tightness, and digit MP, PIP, and DIP extensor tightness. Pt. continues to benefit from working on impoving ROM in order to work towards increasing engagement of her bilateral hands during ADLs, and IADL tasks.  ?  ?  ? ? ? ? ? ? ? ? ? ? ? ? ? ? ? ? ? ? ? ? ? OT Education - 06/01/21 1743   ? ? Education Details BUE functioning, and strengthening   ? Person(s) Educated Associate Professor)   ? Methods Explanation;Demonstration;Verbal cues   ? Comprehension Verbalized  understanding;Need further instruction;Verbal cues required   ? ?  ?  ? ?  ? ? ? ? ? ? OT Long Term Goals - 06/01/21 1108   ? ?  ? OT LONG TERM GOAL #1  ? Title Patient and caregiver will demonstrate understanding of home exercise program for ROM.   ? Baseline 06/01/2021: Pt. now has new restorative aides who are assiting with daily ROM at [facility] now. Pt. has recently been in quarantine for COVID-19. Pt.'s current restorative aide is retiring at the end of December. Pt. continues to have a restorative aide rehab aide assist her wih exercises at [facility]. 8/8: Independently completes/directs HEP mulitple times/week. 12/17/2020: Pt. has a new restorative aide who will be resuming ROM program.   ? Time 12   ? Period Weeks   ? Status Achieved   ? Target Date 07/01/21   ?  ? OT LONG TERM GOAL #4  ? Title Patient will demonstrate improved composite finger flexion to hold deodorant to apply to underarms.   ? Baseline 01/12/2021:  Pt. is progressing, and is able to reach to grasp, and hold narrow cones. 10/20/2020: Pt. conitnues to make steady progress with bilateral digit flexion.Pt. continues to present with stiifness/tightness which is hindering pt.'s ability to formulate a full composite fist. Pt. continues to present with improving finger flexion of R hand for a partial gross grasp, but unable to grasp and hold items. pt. conitnues to present with limited digit flexion of L hand. Pt. has active left thumb abduction. 8/8: holds cup with 2 hands, continues to have limited composite finger flexion L worse than R. 02/16/2021: pt. continues to present with limited bilateral gross composite fisting. 04/08/2021: pt. continues to present with limited digit flexion affecting her ability to formulate bilateral composite fisting. 110th visivt: Pt. is able to hold the utensils in preparation for self-feeding. 06/01/2021: continues to have difficulty formulating a fist, however has improved with self-feeding, and is now using  standard utensils.   ? Time 12   ? Period Weeks   ? Status On-going   ? Target Date 07/01/21   ?  ? OT LONG TERM GOAL #6  ? Title Patient will increase right UE ROM to comb the back of her hair with modified independence.   ? Baseline 01/12/2022: Pt. is able to comb her hair, reaching to the back of her hair.. 10/20/2020: Pt. continues to progress wtith RRUE ROM, and is able to use a pic for the back of her head, however is unable to brush the back of her hair. 07/30/2020: Pt. is now able to comb the back of her hair with a pic, however continues to work on completing the task efficiently. 8/8: Achieves with great effort, has assistance for efficiancy. 02/16/2021: Pt. is now able to reach, and brush the back of her hair.  04/08/2021: pt. continues to improve reach towards the back of her hair. 110th visit: Pt. is to reach towards the back of her hair, however has difficulty sustaining her arms in elevaton for the entire duration of perfroming hair cre thoroughly. 06/01/2021: Pt. is able to reach to the back of her head, however has difficutly with thoroughness.   ? Time 12   ? Period Weeks   ? Status Partially Met   ? Target Date 07/01/21   ?  ? OT LONG TERM GOAL #8  ? Title Pt. will write, and sign her name with 100% legibility, and modified independence.   ? Baseline 01/12/2021: Pt. continnues to fill out her daily menu and complete puzzles. 10/20/2020: Pt. continues to make progress overall. Pt. continues to consistently fill out her daily menu, and writing for crossword puzzles. Pt. is able to maintain grasp on a wide width pen. Contineus to work on [employer]. 8/8: writes daily, continues to work on improving speed. 02/16/2021: Pt. continues to present wiht limited writing legibility, fluctuating daily. 04/08/2021: Pt. continues to to be able to fill out her daily menu.limited writing legibility for daily puzzles.110th visit: Pt. continues to present with limited legibility, and efficiency for completing daily  puzzles. 06/01/2021: pt. continues to work on the consistency with legibility.   ? Time 12   ? Period Weeks   ? Status Partially Met   ? Target Date 07/01/21   ? ?  ?  ? ?  ? ? ? ? ? ? ? ? Plan - 06/01/21 17

## 2021-06-01 NOTE — Therapy (Signed)
[place] ?[facility] ?[place], [place], [address] ?Phone: [phone]   Fax:  [phone] ? ?Patient Details  ?Name: Sherry [name] ?MRN: [id number] ?Date of Birth: 06-15-1934 ?Referring Provider:  Venia Carbon, MD ? ?Encounter Date: 06/01/2021 ? ?*patient arrived in good spirits- as patient was being transferred (SPT) from [place] to mat- she experienced episode of incontinence with bowels and required extensive time to clean up and change- so not further treatment today.  ? ? ?Lewis Moccasin, PT ?06/01/2021, 11:51 PM ? ?[place] ?[facility] ?[place], [place], [address] ?Phone: [phone]   Fax:  [phone] ?

## 2021-06-03 ENCOUNTER — Ambulatory Visit: Payer: Medicare HMO

## 2021-06-03 ENCOUNTER — Encounter: Payer: Self-pay | Admitting: Occupational Therapy

## 2021-06-03 ENCOUNTER — Ambulatory Visit: Payer: Medicare HMO | Admitting: Occupational Therapy

## 2021-06-03 DIAGNOSIS — R2681 Unsteadiness on feet: Secondary | ICD-10-CM

## 2021-06-03 DIAGNOSIS — S14129S Central cord syndrome at unspecified level of cervical spinal cord, sequela: Secondary | ICD-10-CM

## 2021-06-03 DIAGNOSIS — M6281 Muscle weakness (generalized): Secondary | ICD-10-CM | POA: Diagnosis not present

## 2021-06-03 DIAGNOSIS — R262 Difficulty in walking, not elsewhere classified: Secondary | ICD-10-CM

## 2021-06-03 DIAGNOSIS — R278 Other lack of coordination: Secondary | ICD-10-CM

## 2021-06-03 DIAGNOSIS — R2689 Other abnormalities of gait and mobility: Secondary | ICD-10-CM

## 2021-06-03 DIAGNOSIS — R269 Unspecified abnormalities of gait and mobility: Secondary | ICD-10-CM

## 2021-06-03 NOTE — Therapy (Signed)
[place] ?[facility] ?[place], [place], [address] ?Phone: [phone]   Fax:  [phone] ? ?Physical Therapy Treatment ? ?Patient Details  ?Name: Sherry [name] ?MRN: [id number] ?Date of Birth: 25-Dec-1934 ?No data recorded ? ?Encounter Date: 06/03/2021 ? ? PT End of Session - 06/03/21 2057   ? ? Visit Number 143   ? Number of Visits 153   ? Date for PT Re-Evaluation 06/22/21   ? Authorization Type [employer] FOTO performed by PT on eval (7/20), score 12, Progress note on 03/10/2020; PN on 08/20/2020   ? Authorization Time Period Recert 07/08/3417- 3/79/0240; PN on 10/09/3530; Recert 9/92/4268- 34/19/6222; PN on 9/79/8921; Recert 19/41/7408- 1/44/8185; Recert 6/31/4970-2/63/7858   ? Progress Note Due on Visit 150   ? PT Start Time 1146   ? PT Stop Time 1221   ? PT Time Calculation (min) 35 min   ? Equipment Utilized During Treatment Gait belt   ? Activity Tolerance Patient tolerated treatment well;Patient limited by fatigue   ? Behavior During Therapy [facility] for tasks assessed/performed   ? ?  ?  ? ?  ? ? ?Past Medical History:  ?Diagnosis Date  ? Acute blood loss anemia   ? Arthritis   ? Cancer [facility])   ? skin  ? Central cord syndrome at C6 level of cervical spinal cord ([place]) 11/29/2017  ? Hypertension   ? Protein-calorie malnutrition, severe ([place]) 01/24/2018  ? S/P insertion of IVC (inferior vena caval) filter 01/24/2018  ? Tetraparesis ([place])   ? ? ?Past Surgical History:  ?Procedure Laterality Date  ? ANTERIOR CERVICAL DECOMP/DISCECTOMY FUSION N/A 11/29/2017  ? Procedure: Cervical five-six, six-seven Anterior Cervical Decompression Fusion;  Surgeon: Judith Part, MD;  Location: [place];  Service: Neurosurgery;  Laterality: N/A;  Cervical five-six, six-seven Anterior Cervical Decompression Fusion  ? CATARACT EXTRACTION    ? IR IVC FILTER PLMT / S&I /IMG GUID/MOD SED  12/08/2017  ? ? ?There were no vitals filed for this visit. ? ? Subjective Assessment -  06/03/21 2055   ? ? Subjective Patient reports having a good week and ready to try to walk again today.   ? Pertinent History Pt is an 87 yo female that fell in 2019, fractured vertebrae in her neck and in her low back per family. 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 After hospital stay, pt discharged to [facility] for ~1 month, experienced severe UTI as well as bilateral DVT (IVC filter placed). Discharged to [facility], stayed for about a year, and then moved to [facility] in April 2021. Was receiving PT, but per family facility reported that did not have adequate equipment to maximize PT for her. Pt until about 1 month ago was practicing sit to stand transfers with 1-2 people, and working on static standing. Has a brace for L foot due to PF. Pt currently needs assistance with all ADLs (able to assist with donning/doffing her shirt), bed baths, and needs a hoyer lift for transfers. Able to drive power wheelchair without assistance.   ? Limitations Standing;Walking;House hold activities;Lifting   ? How long can you sit comfortably? no issues   ? How long can you stand comfortably? unable   ? How long can you walk comfortably? unable   ? Patient Stated Goals "to stand up and walk"   ? Currently in Pain? No/denies   ? ?  ?  ? ?  ? ? ? ? ? ?Therapeutic Activities:  ?  ?  Patient performed stand pivot transfer from power w/c to mat table- Mod- max assist to stand and pivot today over to table.* Patient was able to pick her right foot up slightly during pivot  ?  ?Patient was able to roll left and right to don the harness of litegait and then able to sit back up at edge of bed with max assist with LE and min assist with upper trunk.  ?  ?Patient later performed transfer from edge of mat back to power wheelchair with max assist due to fatigue.  ?  ?Gait training:  Using Litegait unweighing system  ?Patient was able to stand with use of lift and somes issues with padding/harness today including the harness  riding up too high which affect patient gait performance.  She  ambulated in clinic approx 35 feet - VC for step length and mild difficulty with foot placement and some obvious scissoring of left LE. Patient reported she did not feel as supported and some increased discomfort. She was fatigued after walking yet stopped due to harness riding up too high.  ? ? Education provided throughout session via VC/TC and demonstration to facilitate movement at target joints and correct muscle activation for all testing and exercises performed. ?  ?  ?  ?  ?  ?  ?  ?  ?  ?  ? ? ? ? ? ? ? ? ? ? ? ? ? ? ? ? ? ? ? ? ? ? ? PT Education - 06/03/21 2056   ? ? Education Details Gait sequencing and potural cues   ? Person(s) Educated Patient   ? Methods Explanation;Demonstration;Tactile cues;Verbal cues   ? Comprehension Verbalized understanding;Returned demonstration;Verbal cues required;Tactile cues required;Need further instruction   ? ?  ?  ? ?  ? ? ? PT Short Term Goals - 03/10/20 2211   ? ?  ? PT SHORT TERM GOAL #1  ? Title The patient will perform initial HEP with minimum assistance in order to improve strength and function.   ? Baseline Patient demonstrating independence with initial HEP as of 03/10/2020 with no questions or difficulty.   ? Time 4   ? Period Weeks   ? Status Achieved   ? Target Date 02/11/20   ? ?  ?  ? ?  ? ? ? ? PT Long Term Goals - 03/30/21 1150   ? ?  ? PT LONG TERM GOAL #1  ? Title The patient will be compliant with finalized HEP with minimum assistance in preparation for self management and maintenance of condition.   ? Baseline 03/10/2020- Patient familiar with initial HEP and will keep goal active as HEP becomes more progressive including possible transfers and standing activities. 04/07/2020- Patient continues to report compliance with LE strengthening home exercises without questions or concerns. 04/21/2020- Patient able to verbalize and demonstrate good understanding of current HEP including some supine  and seated LE strengthening exercises. Reviewed and patient performed 10 reps today with minimal cueing. Will keep goal active to progress HEP as appropriate. 05/28/2020- patient reports continues to perform LE strengthening as able and some help from caregiver as able. No changes at this time to home program as patient is limited to supine/seated therex. 07/02/2020- Patient continues to report compliance with current supine and seated LE strengtheing home program and states no questions or concerns regarding her plan.   ? Time 12   ? Period Weeks   ? Status Achieved   ?  ? PT  LONG TERM GOAL #2  ? Title The patient will demonstrate at least 10 point improvement on FOTO score indicating an improved ability to perform functional activities.   ? Baseline on eval (/720) score 12; 10/22/19: 12; 12/10/19: 18, 12/13: 18: 04/07/2020- Will assess next visit. 07/02/2020- Will obtain next visit. 09/24/2020= Will obtain next visit. 11/12/2020= 12; 12/22/2020= 12; 03/30/2021= 17   ? Time 12   ? Period Weeks   ? Status On-going   ? Target Date 06/22/21   ?  ? PT LONG TERM GOAL #3  ? Title The patient will demonstrate lateral scooting  for assistance with transfers with minA to maximize independence.   ? Baseline 10/22/19: Pt requires modA+1 for lateral scooting; 12/10/19: modA+1, 12/13: modA-maxA, 12/27: modA-maxA, 03/10/2020- ModA-MaxA- increased verbal cues and visual demonstration. 04/07/2020- Continued Max assist to perform forward/lateral scoot. 04/21/2020- Patient was able to demo slight lateral scoot with CGA approx 12 in then fatigued requiring max assist- she is able to scoot forward by leaning without significant issues. 05/28/2020- While sitting in chair- patient able to perform forward and lateral scoot with VC/visual demo and increased time allowed- CGA today but not consistent yet- will keep goal active at this time to continue to focus on strengthening and improving technique. 07/02/2020- Patient able to demonstrate minimal ability  to laterally shift weigh on mat table requiring max assist yet is able to forward scoot out to edge of mat table with min Assist. 08/20/2020 - Patient was able to demonstrate minimal lateral scooting at

## 2021-06-03 NOTE — Therapy (Signed)
[facility] ?[place], [place], [address] ?Phone: [phone]   Fax:  [phone] ? ?Occupational Therapy Treatment ? ?Patient Details  ?Name: Sherry [name] ?MRN: [id number] ?Date of Birth: 25-Aug-1934 ?No data recorded ? ?Encounter Date: 06/03/2021 ? ? OT End of Session - 06/03/21 1726   ? ? Visit Number [id number]   ? Number of Visits 154   ? Date for OT Re-Evaluation 07/01/21   ? Authorization Time Period Progress report period starting 11/26/2020   ? OT Start Time 1100   ? OT Stop Time 1145   ? OT Time Calculation (min) 45 min   ? Activity Tolerance Patient tolerated treatment well   ? Behavior During Therapy [facility] for tasks assessed/performed   ? ?  ?  ? ?  ? ? ?Past Medical History:  ?Diagnosis Date  ? Acute blood loss anemia   ? Arthritis   ? Cancer [facility])   ? skin  ? Central cord syndrome at C6 level of cervical spinal cord ([place]) 11/29/2017  ? Hypertension   ? Protein-calorie malnutrition, severe ([place]) 01/24/2018  ? S/P insertion of IVC (inferior vena caval) filter 01/24/2018  ? Tetraparesis ([place])   ? ? ?Past Surgical History:  ?Procedure Laterality Date  ? ANTERIOR CERVICAL DECOMP/DISCECTOMY FUSION N/A 11/29/2017  ? Procedure: Cervical five-six, six-seven Anterior Cervical Decompression Fusion;  Surgeon: Judith Part, MD;  Location: [place];  Service: Neurosurgery;  Laterality: N/A;  Cervical five-six, six-seven Anterior Cervical Decompression Fusion  ? CATARACT EXTRACTION    ? IR IVC FILTER PLMT / S&I /IMG GUID/MOD SED  12/08/2017  ? ? ?There were no vitals filed for this visit. ? ? Subjective Assessment - 06/03/21 1726   ? ? Subjective  Pt. reports doing well today   ? Patient is accompanied by: Family member   ? Pertinent History Patient s/p fall November 29, 2017 resulting in diagnosis of central cord syndrome at C 6 level.  She has had therapy in multiple venues but with recent move to [facility], therapy staff recommended she seek  outpatient therapy for her needs.   ? Currently in Pain? No/denies   ? ?  ?  ? ?  ? ? ?OT TREATMENT   ?   ?Therapeutic Ex. ?  ?Pt. tolerated PROM bilateral wrist extension, digit MP, PIP, and DIP flexion, and extension, thumb radial, and palmar abduction in conjunction with moist heat. Emphasis was placed on left wrist extension, bilateral thumb radial, and palmar abduction, and bilateral digit MP, PIP, and DIP flexion. Pt. worked on [employer], and reciprocal motion using the UBE while sitting for 8 min. with minimal resistance. ?  ?Manual Therapy: ?  ?Pt. Tolerated soft tissue massage to the volar, and dorsal aspect of the bilateral 2nd through 5th digits for stiffness independent of, and in preparation for therapeutic Ex.  ?  ?Pt. continues to present with bilateral wrist, and digit tightness, and continues to work towards improving with reaching across midline. Pt. tolerated manual therapy well. Pt. Continues to tolerate the UBE without difficulty with increasing resistance. Pt. tolerated ROM well without reports of pain, or discomfort following moist heat modality. Pt. Presents with tightness at the bilateral thumb webspaces. Pt. continues to present with bilateral wrist flexor tightness, and digit MP, PIP, and DIP extensor tightness. Pt. continues to benefit from working on impoving ROM in order to work towards increasing engagement of her bilateral hands during ADLs, and IADL tasks.  ?  ? ? ? ? ? ? ? ? ? ? ? ? ? ? ? ? ? ? ? ?  OT Education - 06/03/21 1726   ? ? Education Details BUE functioning, and strengthening   ? Person(s) Educated Associate Professor)   ? Methods Explanation;Demonstration;Verbal cues   ? Comprehension Verbalized understanding;Need further instruction;Verbal cues required   ? ?  ?  ? ?  ? ? ? ? ? ? OT Long Term Goals - 06/01/21 1108   ? ?  ? OT LONG TERM GOAL #1  ? Title Patient and caregiver will demonstrate understanding of home exercise program for ROM.   ? Baseline 06/01/2021:  Pt. now has new restorative aides who are assiting with daily ROM at [facility] now. Pt. has recently been in quarantine for COVID-19. Pt.'s current restorative aide is retiring at the end of December. Pt. continues to have a restorative aide rehab aide assist her wih exercises at [facility]. 8/8: Independently completes/directs HEP mulitple times/week. 12/17/2020: Pt. has a new restorative aide who will be resuming ROM program.   ? Time 12   ? Period Weeks   ? Status Achieved   ? Target Date 07/01/21   ?  ? OT LONG TERM GOAL #4  ? Title Patient will demonstrate improved composite finger flexion to hold deodorant to apply to underarms.   ? Baseline 01/12/2021:  Pt. is progressing, and is able to reach to grasp, and hold narrow cones. 10/20/2020: Pt. conitnues to make steady progress with bilateral digit flexion.Pt. continues to present with stiifness/tightness which is hindering pt.'s ability to formulate a full composite fist. Pt. continues to present with improving finger flexion of R hand for a partial gross grasp, but unable to grasp and hold items. pt. conitnues to present with limited digit flexion of L hand. Pt. has active left thumb abduction. 8/8: holds cup with 2 hands, continues to have limited composite finger flexion L worse than R. 02/16/2021: pt. continues to present with limited bilateral gross composite fisting. 04/08/2021: pt. continues to present with limited digit flexion affecting her ability to formulate bilateral composite fisting. 110th visivt: Pt. is able to hold the utensils in preparation for self-feeding. 06/01/2021: continues to have difficulty formulating a fist, however has improved with self-feeding, and is now using standard utensils.   ? Time 12   ? Period Weeks   ? Status On-going   ? Target Date 07/01/21   ?  ? OT LONG TERM GOAL #6  ? Title Patient will increase right UE ROM to comb the back of her hair with modified independence.   ? Baseline 01/12/2022: Pt. is able to comb her hair,  reaching to the back of her hair.. 10/20/2020: Pt. continues to progress wtith RRUE ROM, and is able to use a pic for the back of her head, however is unable to brush the back of her hair. 07/30/2020: Pt. is now able to comb the back of her hair with a pic, however continues to work on completing the task efficiently. 8/8: Achieves with great effort, has assistance for efficiancy. 02/16/2021: Pt. is now able to reach, and brush the back of her hair. 04/08/2021: pt. continues to improve reach towards the back of her hair. 110th visit: Pt. is to reach towards the back of her hair, however has difficulty sustaining her arms in elevaton for the entire duration of perfroming hair cre thoroughly. 06/01/2021: Pt. is able to reach to the back of her head, however has difficutly with thoroughness.   ? Time 12   ? Period Weeks   ? Status Partially Met   ? Target  Date 07/01/21   ?  ? OT LONG TERM GOAL #8  ? Title Pt. will write, and sign her name with 100% legibility, and modified independence.   ? Baseline 01/12/2021: Pt. continnues to fill out her daily menu and complete puzzles. 10/20/2020: Pt. continues to make progress overall. Pt. continues to consistently fill out her daily menu, and writing for crossword puzzles. Pt. is able to maintain grasp on a wide width pen. Contineus to work on Media planner. 8/8: writes daily, continues to work on improving speed. 02/16/2021: Pt. continues to present wiht limited writing legibility, fluctuating daily. 04/08/2021: Pt. continues to to be able to fill out her daily menu.limited writing legibility for daily puzzles.110th visit: Pt. continues to present with limited legibility, and efficiency for completing daily puzzles. 06/01/2021: pt. continues to work on the consistency with legibility.   ? Time 12   ? Period Weeks   ? Status Partially Met   ? Target Date 07/01/21   ? ?  ?  ? ?  ? ? ? ? ? ? ? ? Plan - 06/03/21 1726   ? ? Clinical Impression Statement Pt. continues to present with  bilateral wrist, and digit tightness, and continues to work towards improving with reaching across midline. Pt. tolerated manual therapy well. Pt. Continues to tolerate the UBE without difficulty with increasing

## 2021-06-08 ENCOUNTER — Ambulatory Visit: Payer: Medicare HMO | Admitting: Occupational Therapy

## 2021-06-08 ENCOUNTER — Ambulatory Visit: Payer: Medicare HMO

## 2021-06-10 ENCOUNTER — Encounter: Payer: Self-pay | Admitting: Occupational Therapy

## 2021-06-10 ENCOUNTER — Ambulatory Visit: Payer: Medicare HMO

## 2021-06-10 ENCOUNTER — Ambulatory Visit: Payer: Medicare HMO | Admitting: Occupational Therapy

## 2021-06-10 DIAGNOSIS — R278 Other lack of coordination: Secondary | ICD-10-CM

## 2021-06-10 DIAGNOSIS — S14129S Central cord syndrome at unspecified level of cervical spinal cord, sequela: Secondary | ICD-10-CM

## 2021-06-10 DIAGNOSIS — F39 Unspecified mood [affective] disorder: Secondary | ICD-10-CM | POA: Diagnosis not present

## 2021-06-10 DIAGNOSIS — M6281 Muscle weakness (generalized): Secondary | ICD-10-CM | POA: Diagnosis not present

## 2021-06-10 DIAGNOSIS — R269 Unspecified abnormalities of gait and mobility: Secondary | ICD-10-CM

## 2021-06-10 DIAGNOSIS — N319 Neuromuscular dysfunction of bladder, unspecified: Secondary | ICD-10-CM

## 2021-06-10 DIAGNOSIS — G8220 Paraplegia, unspecified: Secondary | ICD-10-CM

## 2021-06-10 DIAGNOSIS — R140 Abdominal distension (gaseous): Secondary | ICD-10-CM

## 2021-06-10 DIAGNOSIS — M810 Age-related osteoporosis without current pathological fracture: Secondary | ICD-10-CM | POA: Diagnosis not present

## 2021-06-10 DIAGNOSIS — R262 Difficulty in walking, not elsewhere classified: Secondary | ICD-10-CM

## 2021-06-10 DIAGNOSIS — E7429 Other disorders of galactose metabolism: Secondary | ICD-10-CM

## 2021-06-10 DIAGNOSIS — I82509 Chronic embolism and thrombosis of unspecified deep veins of unspecified lower extremity: Secondary | ICD-10-CM

## 2021-06-10 DIAGNOSIS — R2681 Unsteadiness on feet: Secondary | ICD-10-CM

## 2021-06-10 DIAGNOSIS — M1990 Unspecified osteoarthritis, unspecified site: Secondary | ICD-10-CM | POA: Diagnosis not present

## 2021-06-10 DIAGNOSIS — R2689 Other abnormalities of gait and mobility: Secondary | ICD-10-CM

## 2021-06-10 DIAGNOSIS — I10 Essential (primary) hypertension: Secondary | ICD-10-CM | POA: Diagnosis not present

## 2021-06-10 DIAGNOSIS — K592 Neurogenic bowel, not elsewhere classified: Secondary | ICD-10-CM

## 2021-06-10 NOTE — Therapy (Signed)
[place] [facility] ?[place], [place], [address] ?Phone: [phone]   Fax:  [phone] ? ?Occupational Therapy Treatment ? ?Patient Details  ?Name: Sherry [name] ?MRN: [id number] ?Date of Birth: 1934/05/09 ?No data recorded ? ?Encounter Date: 06/10/2021 ? ? OT End of Session - 06/10/21 1157   ? ? Visit Number [id number]   ? Number of Visits 154   ? Date for OT Re-Evaluation 07/01/21   ? Authorization Time Period Progress report period starting 11/26/2020   ? OT Start Time 1100   ? OT Stop Time 1145   ? OT Time Calculation (min) 45 min   ? Activity Tolerance Patient tolerated treatment well   ? Behavior During Therapy [facility] for tasks assessed/performed   ? ?  ?  ? ?  ? ? ?Past Medical History:  ?Diagnosis Date  ? Acute blood loss anemia   ? Arthritis   ? Cancer [facility])   ? skin  ? Central cord syndrome at C6 level of cervical spinal cord ([place]) 11/29/2017  ? Hypertension   ? Protein-calorie malnutrition, severe ([place]) 01/24/2018  ? S/P insertion of IVC (inferior vena caval) filter 01/24/2018  ? Tetraparesis ([place])   ? ? ?Past Surgical History:  ?Procedure Laterality Date  ? ANTERIOR CERVICAL DECOMP/DISCECTOMY FUSION N/A 11/29/2017  ? Procedure: Cervical five-six, six-seven Anterior Cervical Decompression Fusion;  Surgeon: Judith Part, MD;  Location: [place];  Service: Neurosurgery;  Laterality: N/A;  Cervical five-six, six-seven Anterior Cervical Decompression Fusion  ? CATARACT EXTRACTION    ? IR IVC FILTER PLMT / S&I /IMG GUID/MOD SED  12/08/2017  ? ? ?There were no vitals filed for this visit. ? ? Subjective Assessment - 06/10/21 1156   ? ? Subjective  Pt. reports doing well today   ? Patient is accompanied by: Family member   ? Pertinent History Patient s/p fall November 29, 2017 resulting in diagnosis of central cord syndrome at C 6 level.  She has had therapy in multiple venues but with recent move to [facility], therapy staff recommended she seek  outpatient therapy for her needs.   ? Currently in Pain? No/denies   ? Pain Score 0-No pain   ? ?  ?  ? ?  ? ?OT TREATMENT   ?   ?Therapeutic Ex. ?  ?Pt. tolerated PROM followed by AROM bilateral wrist extension, PROM for bilateral digit MP, PIP, and DIP flexion, and extension, thumb radial, and palmar abduction in conjunction with moist heat. Emphasis was placed on left wrist extension, bilateral thumb radial, and palmar abduction, and bilateral digit MP, PIP, and DIP flexion. Pt. worked on [employer], and reciprocal motion using the UBE while sitting for 8 min. with minimal resistance. ?  ?Manual Therapy: ?  ?Pt. tolerated soft tissue massage to the volar, and dorsal aspect of the bilateral 2nd through 5th digits for stiffness independent of, and in preparation for therapeutic Ex.  ?  ?Pt. continues to present with bilateral wrist, and digit tightness, and continues to work towards improving with reaching across midline. Pt. tolerated manual therapy well. Pt. Continues to tolerate the UBE without difficulty with increasing resistance. Pt. tolerated ROM well without reports of pain, or discomfort following moist heat modality. Pt. Presents with tightness at the bilateral thumb webspaces. Pt. continues to present with bilateral wrist flexor tightness, and digit MP, PIP, and DIP extensor tightness. Pt. continues to benefit from working on impoving ROM in order to work towards  increasing engagement of her bilateral hands during ADLs, and IADL tasks.  ?  ?  ? ? ? ? ? ? ? ? ? ? ? ? ? ? ? ? ? ? ? ? ? OT Education - 06/10/21 1157   ? ? Education Details BUE functioning, and strengthening   ? Person(s) Educated Associate Professor)   ? Methods Explanation;Demonstration;Verbal cues   ? Comprehension Verbalized understanding;Need further instruction;Verbal cues required   ? ?  ?  ? ?  ? ? ? ? ? ? OT Long Term Goals - 06/01/21 1108   ? ?  ? OT LONG TERM GOAL #1  ? Title Patient and caregiver will demonstrate  understanding of home exercise program for ROM.   ? Baseline 06/01/2021: Pt. now has new restorative aides who are assiting with daily ROM at [facility] now. Pt. has recently been in quarantine for COVID-19. Pt.'s current restorative aide is retiring at the end of December. Pt. continues to have a restorative aide rehab aide assist her wih exercises at [facility]. 8/8: Independently completes/directs HEP mulitple times/week. 12/17/2020: Pt. has a new restorative aide who will be resuming ROM program.   ? Time 12   ? Period Weeks   ? Status Achieved   ? Target Date 07/01/21   ?  ? OT LONG TERM GOAL #4  ? Title Patient will demonstrate improved composite finger flexion to hold deodorant to apply to underarms.   ? Baseline 01/12/2021:  Pt. is progressing, and is able to reach to grasp, and hold narrow cones. 10/20/2020: Pt. conitnues to make steady progress with bilateral digit flexion.Pt. continues to present with stiifness/tightness which is hindering pt.'s ability to formulate a full composite fist. Pt. continues to present with improving finger flexion of R hand for a partial gross grasp, but unable to grasp and hold items. pt. conitnues to present with limited digit flexion of L hand. Pt. has active left thumb abduction. 8/8: holds cup with 2 hands, continues to have limited composite finger flexion L worse than R. 02/16/2021: pt. continues to present with limited bilateral gross composite fisting. 04/08/2021: pt. continues to present with limited digit flexion affecting her ability to formulate bilateral composite fisting. 110th visivt: Pt. is able to hold the utensils in preparation for self-feeding. 06/01/2021: continues to have difficulty formulating a fist, however has improved with self-feeding, and is now using standard utensils.   ? Time 12   ? Period Weeks   ? Status On-going   ? Target Date 07/01/21   ?  ? OT LONG TERM GOAL #6  ? Title Patient will increase right UE ROM to comb the back of her hair with  modified independence.   ? Baseline 01/12/2022: Pt. is able to comb her hair, reaching to the back of her hair.. 10/20/2020: Pt. continues to progress wtith RRUE ROM, and is able to use a pic for the back of her head, however is unable to brush the back of her hair. 07/30/2020: Pt. is now able to comb the back of her hair with a pic, however continues to work on completing the task efficiently. 8/8: Achieves with great effort, has assistance for efficiancy. 02/16/2021: Pt. is now able to reach, and brush the back of her hair. 04/08/2021: pt. continues to improve reach towards the back of her hair. 110th visit: Pt. is to reach towards the back of her hair, however has difficulty sustaining her arms in elevaton for the entire duration of perfroming hair cre thoroughly.  06/01/2021: Pt. is able to reach to the back of her head, however has difficutly with thoroughness.   ? Time 12   ? Period Weeks   ? Status Partially Met   ? Target Date 07/01/21   ?  ? OT LONG TERM GOAL #8  ? Title Pt. will write, and sign her name with 100% legibility, and modified independence.   ? Baseline 01/12/2021: Pt. continnues to fill out her daily menu and complete puzzles. 10/20/2020: Pt. continues to make progress overall. Pt. continues to consistently fill out her daily menu, and writing for crossword puzzles. Pt. is able to maintain grasp on a wide width pen. Contineus to work on Media planner. 8/8: writes daily, continues to work on improving speed. 02/16/2021: Pt. continues to present wiht limited writing legibility, fluctuating daily. 04/08/2021: Pt. continues to to be able to fill out her daily menu.limited writing legibility for daily puzzles.110th visit: Pt. continues to present with limited legibility, and efficiency for completing daily puzzles. 06/01/2021: pt. continues to work on the consistency with legibility.   ? Time 12   ? Period Weeks   ? Status Partially Met   ? Target Date 07/01/21   ? ?  ?  ? ?  ? ? ? ? ? ? ? ? Plan -  06/10/21 1159   ? ? Clinical Impression Statement Pt. continues to present with bilateral wrist, and digit tightness, and continues to work towards improving with reaching across midline. Pt. tolerated manual therapy well. Pt.

## 2021-06-10 NOTE — Therapy (Signed)
?[facility] ?[place], [place], [address] ?Phone: [phone]   Fax:  [phone] ? ?Physical Therapy Treatment ? ?Patient Details  ?Name: Sherry [name] ?MRN: [id number] ?Date of Birth: [birth year] ?No data recorded ? ?Encounter Date: 06/10/2021 ? ? PT End of Session - 06/10/21 1247   ? ? Visit Number [id number]   ? Number of Visits 153   ? Authorization Type [employer] FOTO performed by PT on eval (7/20), score 12, Progress note on 03/10/2020; PN on 08/20/2020   ? Authorization Time Period Recert 02/07/9676- 9/38/1017; PN on 06/01/256; Recert 06/28/7822- 23/53/6144; PN on 04/16/4006; Recert 67/61/9509- 04/27/7122; Recert 5/80/9983-3/82/5053   ? Progress Note Due on Visit 150   ? PT Start Time 1145   ? PT Stop Time 9767   ? PT Time Calculation (min) 44 min   ? Activity Tolerance Patient tolerated treatment well;Patient limited by fatigue   ? Behavior During Therapy [facility] for tasks assessed/performed   ? ?  ?  ? ?  ? ? ?Past Medical History:  ?Diagnosis Date  ? Acute blood loss anemia   ? Arthritis   ? Cancer [facility])   ? skin  ? Central cord syndrome at C6 level of cervical spinal cord ([place]) 11/29/2017  ? Hypertension   ? Protein-calorie malnutrition, severe ([place]) 01/24/2018  ? S/P insertion of IVC (inferior vena caval) filter 01/24/2018  ? Tetraparesis ([place])   ? ? ?Past Surgical History:  ?Procedure Laterality Date  ? ANTERIOR CERVICAL DECOMP/DISCECTOMY FUSION N/A 11/29/2017  ? Procedure: Cervical five-six, six-seven Anterior Cervical Decompression Fusion;  Surgeon: Judith Part, MD;  Location: [place];  Service: Neurosurgery;  Laterality: N/A;  Cervical five-six, six-seven Anterior Cervical Decompression Fusion  ? CATARACT EXTRACTION    ? IR IVC FILTER PLMT / S&I /IMG GUID/MOD SED  12/08/2017  ? ? ?There were no vitals filed for this visit. ? ? Subjective Assessment - 06/10/21 1214   ? ? Subjective Pt doing well in general, no updates since prior session.    ? Pertinent History Pt is an 87 yo female that fell in 2019, fractured vertebrae in her neck and in her low back per family. 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 After hospital stay, pt discharged to [facility] for ~1 month, experienced severe UTI as well as bilateral DVT (IVC filter placed). Discharged to [facility], stayed for about a year, and then moved to [facility] in April 2021. Was receiving PT, but per family facility reported that did not have adequate equipment to maximize PT for her. Pt until about 1 month ago was practicing sit to stand transfers with 1-2 people, and working on static standing. Has a brace for L foot due to PF. Pt currently needs assistance with all ADLs (able to assist with donning/doffing her shirt), bed baths, and needs a hoyer lift for transfers. Able to drive power wheelchair without assistance.   ? Currently in Pain? No/denies   ? ?  ?  ? ?  ? ? ?Gait training:  Using Litegait unweighing system  ?Patient was able to stand with use of lift and no issues with padding/harness.  She  ambulated in clinic approx 25 feet - Focusing on ability to advance each LE. She did exhibit ability to perform slow step to gait with overall less scissoring with left  and only 1 standing rest break.  ?-retro stepping x10 feet with assistance  ?-upright standing x 90sec  ?  ?  Education provided throughout session via VC/TC and demonstration to facilitate movement at target joints and correct muscle activation for all testing and exercises performed. ? ? ? ? PT Education - 06/10/21 1247   ? ? Education Details gait cues and weightbearing   ? Person(s) Educated Patient   ? Methods Explanation   ? Comprehension Verbalized understanding;Need further instruction   ? ?  ?  ? ?  ? ? ? PT Short Term Goals - 03/10/20 2211   ? ?  ? PT SHORT TERM GOAL #1  ? Title The patient will perform initial HEP with minimum assistance in order to improve strength and function.   ? Baseline Patient  demonstrating independence with initial HEP as of 03/10/2020 with no questions or difficulty.   ? Time 4   ? Period Weeks   ? Status Achieved   ? Target Date 02/11/20   ? ?  ?  ? ?  ? ? ? ? PT Long Term Goals - 03/30/21 1150   ? ?  ? PT LONG TERM GOAL #1  ? Title The patient will be compliant with finalized HEP with minimum assistance in preparation for self management and maintenance of condition.   ? Baseline 03/10/2020- Patient familiar with initial HEP and will keep goal active as HEP becomes more progressive including possible transfers and standing activities. 04/07/2020- Patient continues to report compliance with LE strengthening home exercises without questions or concerns. 04/21/2020- Patient able to verbalize and demonstrate good understanding of current HEP including some supine and seated LE strengthening exercises. Reviewed and patient performed 10 reps today with minimal cueing. Will keep goal active to progress HEP as appropriate. 05/28/2020- patient reports continues to perform LE strengthening as able and some help from caregiver as able. No changes at this time to home program as patient is limited to supine/seated therex. 07/02/2020- Patient continues to report compliance with current supine and seated LE strengtheing home program and states no questions or concerns regarding her plan.   ? Time 12   ? Period Weeks   ? Status Achieved   ?  ? PT LONG TERM GOAL #2  ? Title The patient will demonstrate at least 10 point improvement on FOTO score indicating an improved ability to perform functional activities.   ? Baseline on eval (/720) score 12; 10/22/19: 12; 12/10/19: 18, 12/13: 18: 04/07/2020- Will assess next visit. 07/02/2020- Will obtain next visit. 09/24/2020= Will obtain next visit. 11/12/2020= 12; 12/22/2020= 12; 03/30/2021= 17   ? Time 12   ? Period Weeks   ? Status On-going   ? Target Date 06/22/21   ?  ? PT LONG TERM GOAL #3  ? Title The patient will demonstrate lateral scooting  for assistance with  transfers with minA to maximize independence.   ? Baseline 10/22/19: Pt requires modA+1 for lateral scooting; 12/10/19: modA+1, 12/13: modA-maxA, 12/27: modA-maxA, 03/10/2020- ModA-MaxA- increased verbal cues and visual demonstration. 04/07/2020- Continued Max assist to perform forward/lateral scoot. 04/21/2020- Patient was able to demo slight lateral scoot with CGA approx 12 in then fatigued requiring max assist- she is able to scoot forward by leaning without significant issues. 05/28/2020- While sitting in chair- patient able to perform forward and lateral scoot with VC/visual demo and increased time allowed- CGA today but not consistent yet- will keep goal active at this time to continue to focus on strengthening and improving technique. 07/02/2020- Patient able to demonstrate minimal ability to laterally shift weigh on mat table requiring max assist  yet is able to forward scoot out to edge of mat table with min Assist. 08/20/2020 - Patient was able to demonstrate minimal lateral scooting at edge of mat while holding feet still so they did not slip on tile surface. She continues to be able to demonstrate scoot forward and uses tilt option to recline to position safely back into seat of power wheelchair. 09/25/2020- Patient able to sit on mat and minimally scoot laterally but requires min/mod A for efficient scooting. Will end goal at this time due to plateau in progress with scooting over extended time.   ? Time 12   ? Period Weeks   ? Status Not Met   ?  ? PT LONG TERM GOAL #4  ? Title The patient will demonstrate a squat pivot transfer with minimum assistance to maximize independence and mobility.   ? Baseline 10/22/19: unable to safely attempt at this time; 12/10/19: unable to safely attempt at this time, 01/14/20: unable to safety attempt at this time. 03/10/2020- Patient able to perform Stand pivot transfer with Maximal assist. 04/07/2020- Patient continues to require Max assist with sit to stand- will assist SPT next  visit. 04/21/2020- Patient presents with Max assist to perform Sit to stand Transfer- unable to pivot or move Left LE well without difficulty. 07/02/2020= Max assist with SPT and minimal ability to pivot. 7/202/2022-

## 2021-06-15 ENCOUNTER — Ambulatory Visit: Payer: Medicare HMO

## 2021-06-15 DIAGNOSIS — S14129S Central cord syndrome at unspecified level of cervical spinal cord, sequela: Secondary | ICD-10-CM

## 2021-06-15 DIAGNOSIS — R2689 Other abnormalities of gait and mobility: Secondary | ICD-10-CM

## 2021-06-15 DIAGNOSIS — R278 Other lack of coordination: Secondary | ICD-10-CM

## 2021-06-15 DIAGNOSIS — M6281 Muscle weakness (generalized): Secondary | ICD-10-CM

## 2021-06-15 DIAGNOSIS — R269 Unspecified abnormalities of gait and mobility: Secondary | ICD-10-CM

## 2021-06-15 DIAGNOSIS — R262 Difficulty in walking, not elsewhere classified: Secondary | ICD-10-CM

## 2021-06-15 DIAGNOSIS — R2681 Unsteadiness on feet: Secondary | ICD-10-CM

## 2021-06-15 NOTE — Therapy (Signed)
[facility] ?[place], [place], [address] ?Phone: [phone]   Fax:  [phone] ? ?Occupational Therapy Treatment ? ?Patient Details  ?Name: Sherry [name] ?MRN: [id number] ?Date of Birth: 05-Mar-1934 ?No data recorded ? ?Encounter Date: 06/15/2021 ? ? OT End of Session - 06/15/21 1237   ? ? Visit Number [id number]   ? Number of Visits 154   ? Date for OT Re-Evaluation 07/01/21   ? Authorization Time Period Progress report period starting 11/26/2020   ? OT Start Time 1100   ? OT Stop Time 1145   ? OT Time Calculation (min) 45 min   ? Equipment Utilized During Treatment moist heat   ? Activity Tolerance Patient tolerated treatment well   ? Behavior During Therapy [facility] for tasks assessed/performed   ? ?  ?  ? ?  ? ? ?Past Medical History:  ?Diagnosis Date  ? Acute blood loss anemia   ? Arthritis   ? Cancer [facility])   ? skin  ? Central cord syndrome at C6 level of cervical spinal cord ([place]) 11/29/2017  ? Hypertension   ? Protein-calorie malnutrition, severe ([place]) 01/24/2018  ? S/P insertion of IVC (inferior vena caval) filter 01/24/2018  ? Tetraparesis ([place])   ? ? ?Past Surgical History:  ?Procedure Laterality Date  ? ANTERIOR CERVICAL DECOMP/DISCECTOMY FUSION N/A 11/29/2017  ? Procedure: Cervical five-six, six-seven Anterior Cervical Decompression Fusion;  Surgeon: Judith Part, MD;  Location: [place];  Service: Neurosurgery;  Laterality: N/A;  Cervical five-six, six-seven Anterior Cervical Decompression Fusion  ? CATARACT EXTRACTION    ? IR IVC FILTER PLMT / S&I /IMG GUID/MOD SED  12/08/2017  ? ? ?There were no vitals filed for this visit. ? ? Subjective Assessment - 06/15/21 1233   ? ? Subjective  Pt reports having her daughter and SIL visit for Mother's Day.   ? Patient is accompanied by: Family member   ? Pertinent History Patient s/p fall November 29, 2017 resulting in diagnosis of central cord syndrome at C 6 level.  She has had therapy in multiple  venues but with recent move to [facility], therapy staff recommended she seek outpatient therapy for her needs.   ? Patient Stated Goals Patient would like to be able to move better in bed, stand and perform self care tasks.   ? Currently in Pain? No/denies   ? Pain Score 0-No pain   ? Pain Onset 1 to 4 weeks ago   ? ?  ?  ? ?  ?Occupational Therapy Treatment: ?Moist heat applied to R/L hands x 5 min for muscle relaxation in prep for passive stretching.  ? ?Therapeutic Exercise: ?Pt. tolerated PROM for bilateral digit MP, PIP, and DIP flexion, and extension, thumb radial, and palmar abduction in conjunction with moist heat.  Emphasis was placed on left wrist extension, bilateral thumb radial, and palmar abduction, and bilateral digit MP, PIP, and DIP flexion.  Pt participated in grasp/release and multiplane functional reaching with BUEs.  Pt picked up small stress balls from table top and her lap, and reached shoulder level and above to release ball into bucket.  Provided gentle, prolonged digit flexion stretching to R hand using ACE wrap to secure R hand digits into flexed position around stress ball.  Tolerated 5 min without c/o discomfort then OT removed.  OT encouraged pt have CNA assist with this at home 2-3 times per day at 5-10 min at a time, may increase  gradually as tolerated to increase ability to close hand to grasp ADL supplies.  Instructed pt this should be pain free and a comfortable stretch.  Pt verbalized understanding and tolerated all exercises well.  Pt presents with tightness at the bilateral thumb webspaces. Pt. continues to present with bilateral wrist flexor tightness, and digit MP, PIP, and DIP extensor tightness. Pt. continues to benefit from working on impoving ROM in order to work towards increasing engagement of her bilateral hands during ADLs, and IADL tasks.  ? ? ? ? ? OT Education - 06/15/21 1233   ? ? Education Details use of ACE wrap to secure R hand digits around stress ball for  gentle, prolonged stretch for closing hand   ? Person(s) Educated Patient   ? Methods Explanation;Demonstration;Verbal cues   ? Comprehension Verbalized understanding;Need further instruction;Verbal cues required   ? ?  ?  ? ?  ? ? ? ? ? ? OT Long Term Goals - 06/01/21 1108   ? ?  ? OT LONG TERM GOAL #1  ? Title Patient and caregiver will demonstrate understanding of home exercise program for ROM.   ? Baseline 06/01/2021: Pt. now has new restorative aides who are assiting with daily ROM at [facility] now. Pt. has recently been in quarantine for COVID-19. Pt.'s current restorative aide is retiring at the end of December. Pt. continues to have a restorative aide rehab aide assist her wih exercises at [facility] 8/8: Independently completes/directs HEP mulitple times/week. 12/17/2020: Pt. has a new restorative aide who will be resuming ROM program.   ? Time 12   ? Period Weeks   ? Status Achieved   ? Target Date 07/01/21   ?  ? OT LONG TERM GOAL #4  ? Title Patient will demonstrate improved composite finger flexion to hold deodorant to apply to underarms.   ? Baseline 01/12/2021:  Pt. is progressing, and is able to reach to grasp, and hold narrow cones. 10/20/2020: Pt. conitnues to make steady progress with bilateral digit flexion.Pt. continues to present with stiifness/tightness which is hindering pt.'s ability to formulate a full composite fist. Pt. continues to present with improving finger flexion of R hand for a partial gross grasp, but unable to grasp and hold items. pt. conitnues to present with limited digit flexion of L hand. Pt. has active left thumb abduction. 8/8: holds cup with 2 hands, continues to have limited composite finger flexion L worse than R. 02/16/2021: pt. continues to present with limited bilateral gross composite fisting. 04/08/2021: pt. continues to present with limited digit flexion affecting her ability to formulate bilateral composite fisting. 110th visivt: Pt. is able to hold the utensils  in preparation for self-feeding. 06/01/2021: continues to have difficulty formulating a fist, however has improved with self-feeding, and is now using standard utensils.   ? Time 12   ? Period Weeks   ? Status On-going   ? Target Date 07/01/21   ?  ? OT LONG TERM GOAL #6  ? Title Patient will increase right UE ROM to comb the back of her hair with modified independence.   ? Baseline 01/12/2022: Pt. is able to comb her hair, reaching to the back of her hair.. 10/20/2020: Pt. continues to progress wtith RRUE ROM, and is able to use a pic for the back of her head, however is unable to brush the back of her hair. 07/30/2020: Pt. is now able to comb the back of her hair with a pic, however continues to  work on completing the task efficiently. 8/8: Achieves with great effort, has assistance for efficiancy. 02/16/2021: Pt. is now able to reach, and brush the back of her hair. 04/08/2021: pt. continues to improve reach towards the back of her hair. 110th visit: Pt. is to reach towards the back of her hair, however has difficulty sustaining her arms in elevaton for the entire duration of perfroming hair cre thoroughly. 06/01/2021: Pt. is able to reach to the back of her head, however has difficutly with thoroughness.   ? Time 12   ? Period Weeks   ? Status Partially Met   ? Target Date 07/01/21   ?  ? OT LONG TERM GOAL #8  ? Title Pt. will write, and sign her name with 100% legibility, and modified independence.   ? Baseline 01/12/2021: Pt. continnues to fill out her daily menu and complete puzzles. 10/20/2020: Pt. continues to make progress overall. Pt. continues to consistently fill out her daily menu, and writing for crossword puzzles. Pt. is able to maintain grasp on a wide width pen. Contineus to work on Media planner. 8/8: writes daily, continues to work on improving speed. 02/16/2021: Pt. continues to present wiht limited writing legibility, fluctuating daily. 04/08/2021: Pt. continues to to be able to fill out her daily  menu.limited writing legibility for daily puzzles.110th visit: Pt. continues to present with limited legibility, and efficiency for completing daily puzzles. 06/01/2021: pt. continues to work on the [employer]

## 2021-06-15 NOTE — Therapy (Signed)
[facility] ?[place], [place], [address] ?Phone: [phone]   Fax:  [phone] ? ?Physical Therapy Treatment ? ?Patient Details  ?Name: Sherry [name] ?MRN: [id number] ?Date of Birth: 09-10-1934 ?No data recorded ? ?Encounter Date: 06/15/2021 ? ? PT End of Session - 06/15/21 1202   ? ? Visit Number 145   ? Number of Visits 153   ? Authorization Type a[employer]na [employer]are FOTO performed by PT on eval (7/20), score 12, Progress note on 03/10/2020; PN on 08/20/2020   ? Authorization Time Period Recert 08/06/1023- 8/52/7782; PN on 05/04/3534; Recert 1/44/3154- 00/86/7619; PN on 06/09/3265; Recert 12/45/8099- 8/33/8250; Recert 5/39/7673-05/20/3788   ? Progress Note Due on Visit 150   ? PT Start Time 1146   ? PT Stop Time 1220   ? PT Time Calculation (min) 34 min   ? Activity Tolerance Patient tolerated treatment well;Patient limited by fatigue   ? Behavior During Therapy [facility] for tasks assessed/performed   ? ?  ?  ? ?  ? ? ?Past Medical History:  ?Diagnosis Date  ? Acute blood loss anemia   ? Arthritis   ? Cancer [facility])   ? skin  ? Central cord syndrome at C6 level of cervical spinal cord ([place]) 11/29/2017  ? Hypertension   ? Protein-calorie malnutrition, severe ([place]) 01/24/2018  ? S/P insertion of IVC (inferior vena caval) filter 01/24/2018  ? Tetraparesis ([place])   ? ? ?Past Surgical History:  ?Procedure Laterality Date  ? ANTERIOR CERVICAL DECOMP/DISCECTOMY FUSION N/A 11/29/2017  ? Procedure: Cervical five-six, six-seven Anterior Cervical Decompression Fusion;  Surgeon: Judith Part, MD;  Location: [place];  Service: Neurosurgery;  Laterality: N/A;  Cervical five-six, six-seven Anterior Cervical Decompression Fusion  ? CATARACT EXTRACTION    ? IR IVC FILTER PLMT / S&I /IMG GUID/MOD SED  12/08/2017  ? ? ?There were no vitals filed for this visit. ? ? Subjective Assessment - 06/15/21 1150   ? ? Subjective Patient reports not having a good day states not at her  best today.   ? Pertinent History Pt is an 87 yo female that fell in 2019, fractured vertebrae in her neck and in her low back per family. 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 After hospital stay, pt discharged to CIR for ~1 month, experienced severe UTI as well as bilateral DVT (IVC filter placed). Discharged to [facility], stayed for about a year, and then moved to [facility] in April 2021. Was receiving PT, but per family facility reported that did not have adequate equipment to maximize PT for her. Pt until about 1 month ago was practicing sit to stand transfers with 1-2 people, and working on static standing. Has a brace for L foot due to PF. Pt currently needs assistance with all ADLs (able to assist with donning/doffing her shirt), bed baths, and needs a hoyer lift for transfers. Able to drive power wheelchair without assistance.   ? Currently in Pain? No/denies   ? ?  ?  ? ?  ? ? ? ? ? ? ? ?INTERVENTIONS: ? ?Patient reported not feeling well so agreeable to performing some seated LE strengthening exercises.  ? ? ?Patient performed the following:  ? ?Seated hip march AAROM with right and active Left with 3lb AW - 2 sets of 12 reps ? ?Seated knee ext: 3lb AW on left; AArOM for full ROM on right - 2 sets of 15 reps.  ? ?Seated hamstring  curls: GTB 2 sets of 12 reps each.  ? ?Seated hip add squeeze with ball- Hold 3 sec x 12 reps x 2 sets ? ?Seated Hip abd with AAROM 2 sets of 12 reps. ? ?Education provided throughout session via VC/TC and demonstration to facilitate movement at target joints and correct muscle activation for all testing and exercises performed.  ? ? ? ? ? ? ? ? ? ? ? ? ? ? ? ? ? ? ? ? ? PT Education - 06/15/21 1201   ? ? Education Details Seated therex   ? Person(s) Educated Patient   ? Methods Explanation;Demonstration;Tactile cues;Verbal cues   ? Comprehension Verbalized understanding;Returned demonstration;Verbal cues required;Tactile cues required;Need further  instruction   ? ?  ?  ? ?  ? ? ? PT Short Term Goals - 03/10/20 2211   ? ?  ? PT SHORT TERM GOAL #1  ? Title The patient will perform initial HEP with minimum assistance in order to improve strength and function.   ? Baseline Patient demonstrating independence with initial HEP as of 03/10/2020 with no questions or difficulty.   ? Time 4   ? Period Weeks   ? Status Achieved   ? Target Date 02/11/20   ? ?  ?  ? ?  ? ? ? ? PT Long Term Goals - 03/30/21 1150   ? ?  ? PT LONG TERM GOAL #1  ? Title The patient will be compliant with finalized HEP with minimum assistance in preparation for self management and maintenance of condition.   ? Baseline 03/10/2020- Patient familiar with initial HEP and will keep goal active as HEP becomes more progressive including possible transfers and standing activities. 04/07/2020- Patient continues to report compliance with LE strengthening home exercises without questions or concerns. 04/21/2020- Patient able to verbalize and demonstrate good understanding of current HEP including some supine and seated LE strengthening exercises. Reviewed and patient performed 10 reps today with minimal cueing. Will keep goal active to progress HEP as appropriate. 05/28/2020- patient reports continues to perform LE strengthening as able and some help from caregiver as able. No changes at this time to home program as patient is limited to supine/seated therex. 07/02/2020- Patient continues to report compliance with current supine and seated LE strengtheing home program and states no questions or concerns regarding her plan.   ? Time 12   ? Period Weeks   ? Status Achieved   ?  ? PT LONG TERM GOAL #2  ? Title The patient will demonstrate at least 10 point improvement on FOTO score indicating an improved ability to perform functional activities.   ? Baseline on eval (/720) score 12; 10/22/19: 12; 12/10/19: 18, 12/13: 18: 04/07/2020- Will assess next visit. 07/02/2020- Will obtain next visit. 09/24/2020= Will obtain  next visit. 11/12/2020= 12; 12/22/2020= 12; 03/30/2021= 17   ? Time 12   ? Period Weeks   ? Status On-going   ? Target Date 06/22/21   ?  ? PT LONG TERM GOAL #3  ? Title The patient will demonstrate lateral scooting  for assistance with transfers with minA to maximize independence.   ? Baseline 10/22/19: Pt requires modA+1 for lateral scooting; 12/10/19: modA+1, 12/13: modA-maxA, 12/27: modA-maxA, 03/10/2020- ModA-MaxA- increased verbal cues and visual demonstration. 04/07/2020- Continued Max assist to perform forward/lateral scoot. 04/21/2020- Patient was able to demo slight lateral scoot with CGA approx 12 in then fatigued requiring max assist- she is able to scoot forward by leaning without significant  issues. 05/28/2020- While sitting in chair- patient able to perform forward and lateral scoot with VC/visual demo and increased time allowed- CGA today but not consistent yet- will keep goal active at this time to continue to focus on strengthening and improving technique. 07/02/2020- Patient able to demonstrate minimal ability to laterally shift weigh on mat table requiring max assist yet is able to forward scoot out to edge of mat table with min Assist. 08/20/2020 - Patient was able to demonstrate minimal lateral scooting at edge of mat while holding feet still so they did not slip on tile surface. She continues to be able to demonstrate scoot forward and uses tilt option to recline to position safely back into seat of power wheelchair. 09/25/2020- Patient able to sit on mat and minimally scoot laterally but requires min/mod A for efficient scooting. Will end goal at this time due to plateau in progress with scooting over extended time.   ? Time 12   ? Period Weeks   ? Status Not Met   ?  ? PT LONG TERM GOAL #4  ? Title The patient will demonstrate a squat pivot transfer with minimum assistance to maximize independence and mobility.   ? Baseline 10/22/19: unable to safely attempt at this time; 12/10/19: unable to safely  attempt at this time, 01/14/20: unable to safety attempt at this time. 03/10/2020- Patient able to perform Stand pivot transfer with Maximal assist. 04/07/2020- Patient continues to require Max assist with sit to stand

## 2021-06-17 ENCOUNTER — Encounter: Payer: Self-pay | Admitting: Occupational Therapy

## 2021-06-17 ENCOUNTER — Ambulatory Visit: Payer: Medicare HMO | Admitting: Occupational Therapy

## 2021-06-17 ENCOUNTER — Ambulatory Visit: Payer: Medicare HMO

## 2021-06-17 DIAGNOSIS — R2689 Other abnormalities of gait and mobility: Secondary | ICD-10-CM

## 2021-06-17 DIAGNOSIS — R269 Unspecified abnormalities of gait and mobility: Secondary | ICD-10-CM

## 2021-06-17 DIAGNOSIS — R2681 Unsteadiness on feet: Secondary | ICD-10-CM

## 2021-06-17 DIAGNOSIS — M6281 Muscle weakness (generalized): Secondary | ICD-10-CM

## 2021-06-17 DIAGNOSIS — R262 Difficulty in walking, not elsewhere classified: Secondary | ICD-10-CM

## 2021-06-17 DIAGNOSIS — R278 Other lack of coordination: Secondary | ICD-10-CM

## 2021-06-17 NOTE — Therapy (Signed)
[place] MAIN [facility] [address] [place], [place], [address] Phone: [phone]   Fax:  [phone]  Physical Therapy Treatment  Patient Details  Name: Sherry [name] MRN: [id number] Date of Birth: [birth year] No data recorded  Encounter Date: 06/17/2021   PT End of Session - 06/17/21 1146     Visit Number 146    Number of Visits [place] performed by PT on eval (7/20), score 12, Progress note on 03/10/2020; PN on 08/20/2020    Authorization Time Period Recert 06/06/2561- 8/93/7342; PN on 09/07/6809; Recert 5/72/6203- 55/97/4163; PN on 8/45/3646; Recert 80/32/1224- 09/26/35; Recert 0/48/8891-6/94/5038    Progress Note Due on Visit 150    PT Start Time 1146    PT Stop Time 1218    PT Time Calculation (min) 32 min    Activity Tolerance Patient tolerated treatment well;Patient limited by fatigue    Behavior During Therapy [facility] for tasks assessed/performed             Past Medical History:  Diagnosis Date   Acute blood loss anemia    Arthritis    Cancer ([place])    skin   Central cord syndrome at C6 level of cervical spinal cord ([place]) 11/29/2017   Hypertension    Protein-calorie malnutrition, severe ([place]) 01/24/2018   S/P insertion of IVC (inferior vena caval) filter 01/24/2018   Tetraparesis ([place])     Past Surgical History:  Procedure Laterality Date   ANTERIOR CERVICAL DECOMP/DISCECTOMY FUSION N/A 11/29/2017   Procedure: Cervical five-six, six-seven Anterior Cervical Decompression Fusion;  Surgeon: Judith Part, MD;  Location: [place];  Service: Neurosurgery;  Laterality: N/A;  Cervical five-six, six-seven Anterior Cervical Decompression Fusion   CATARACT EXTRACTION     IR IVC FILTER PLMT / S&I /IMG GUID/MOD SED  12/08/2017    There were no vitals filed for this visit.   Subjective Assessment - 06/17/21 1145     Subjective Patient reports feeling better and agreeable to stand/walk  today.    Pertinent History Pt is an 87 yo female that fell in 2019, fractured vertebrae in her neck and in her low back per family. 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 After hospital stay, pt discharged to CIR for ~1 month, experienced severe UTI as well as bilateral DVT (IVC filter placed). Discharged to [facility], stayed for about a year, and then moved to [facility] in April 2021. Was receiving PT, but per family facility reported that did not have adequate equipment to maximize PT for her. Pt until about 1 month ago was practicing sit to stand transfers with 1-2 people, and working on static standing. Has a brace for L foot due to PF. Pt currently needs assistance with all ADLs (able to assist with donning/doffing her shirt), bed baths, and needs a hoyer lift for transfers. Able to drive power wheelchair without assistance.    Currently in Pain? No/denies              INTERVENTIONS:      Gait training:  Using Litegait unweighing system  Patient was able to stand with use of lift with no issues with padding/harness today   She  ambulated in clinic 40 feet - VC for step length and mild difficulty with foot placement and some obvious scissoring of left LE. Patient was able to stand well with hips over ankle better today. She reported increased fatigue upon completion.  Education provided throughout session via VC/TC and demonstration to facilitate movement at target joints and correct muscle activation for all testing and exercises performed.                         PT Short Term Goals - 03/10/20 2211       PT SHORT TERM GOAL #1   Title The patient will perform initial HEP with minimum assistance in order to improve strength and function.    Baseline Patient demonstrating independence with initial HEP as of 03/10/2020 with no questions or difficulty.    Time 4    Period Weeks    Status Achieved    Target Date 02/11/20                PT Long Term Goals - 03/30/21 1150       PT LONG TERM GOAL #1   Title The patient will be compliant with finalized HEP with minimum assistance in preparation for self management and maintenance of condition.    Baseline 03/10/2020- Patient familiar with initial HEP and will keep goal active as HEP becomes more progressive including possible transfers and standing activities. 04/07/2020- Patient continues to report compliance with LE strengthening home exercises without questions or concerns. 04/21/2020- Patient able to verbalize and demonstrate good understanding of current HEP including some supine and seated LE strengthening exercises. Reviewed and patient performed 10 reps today with minimal cueing. Will keep goal active to progress HEP as appropriate. 05/28/2020- patient reports continues to perform LE strengthening as able and some help from caregiver as able. No changes at this time to home program as patient is limited to supine/seated therex. 07/02/2020- Patient continues to report compliance with current supine and seated LE strengtheing home program and states no questions or concerns regarding her plan.    Time 12    Period Weeks    Status Achieved      PT LONG TERM GOAL #2   Title The patient will demonstrate at least 10 point improvement on FOTO score indicating an improved ability to perform functional activities.    Baseline on eval (/720) score 12; 10/22/19: 12; 12/10/19: 18, 12/13: 18: 04/07/2020- Will assess next visit. 07/02/2020- Will obtain next visit. 09/24/2020= Will obtain next visit. 11/12/2020= 12; 12/22/2020= 12; 03/30/2021= 17    Time 12    Period Weeks    Status On-going    Target Date 06/22/21      PT LONG TERM GOAL #3   Title The patient will demonstrate lateral scooting  for assistance with transfers with minA to maximize independence.    Baseline 10/22/19: Pt requires modA+1 for lateral scooting; 12/10/19: modA+1, 12/13: modA-maxA, 12/27: modA-maxA, 03/10/2020-  ModA-MaxA- increased verbal cues and visual demonstration. 04/07/2020- Continued Max assist to perform forward/lateral scoot. 04/21/2020- Patient was able to demo slight lateral scoot with CGA approx 12 in then fatigued requiring max assist- she is able to scoot forward by leaning without significant issues. 05/28/2020- While sitting in chair- patient able to perform forward and lateral scoot with VC/visual demo and increased time allowed- CGA today but not consistent yet- will keep goal active at this time to continue to focus on strengthening and improving technique. 07/02/2020- Patient able to demonstrate minimal ability to laterally shift weigh on mat table requiring max assist yet is able to forward scoot out to edge of mat table with min Assist. 08/20/2020 - Patient was able to demonstrate minimal lateral scooting at edge  of mat while holding feet still so they did not slip on tile surface. She continues to be able to demonstrate scoot forward and uses tilt option to recline to position safely back into seat of power wheelchair. 09/25/2020- Patient able to sit on mat and minimally scoot laterally but requires min/mod A for efficient scooting. Will end goal at this time due to plateau in progress with scooting over extended time.    Time 12    Period Weeks    Status Not Met      PT LONG TERM GOAL #4   Title The patient will demonstrate a squat pivot transfer with minimum assistance to maximize independence and mobility.    Baseline 10/22/19: unable to safely attempt at this time; 12/10/19: unable to safely attempt at this time, 01/14/20: unable to safety attempt at this time. 03/10/2020- Patient able to perform Stand pivot transfer with Maximal assist. 04/07/2020- Patient continues to require Max assist with sit to stand- will assist SPT next visit. 04/21/2020- Patient presents with Max assist to perform Sit to stand Transfer- unable to pivot or move Left LE well without difficulty. 07/02/2020= Max assist with SPT and  minimal ability to pivot. 7/202/2022-Patient able to stand with minimal assist this visit (as good as CGA last visit) and able to move right foot to pivot and performing with moderate assist today. 09/24/2020- Patient able to perform sit to stand with Min assist from lift chair. Patient has demo inconsistency and will keep goal active.  12/17/2020- Goal is currently not appropriate as patient fractured her ankle and currently NWB. 12/22/2020=Goal is currently not appropriate as patient fractured her ankle and currently NWB.  03/30/2021=Patient was able to demo min assist to stand holding onto PT back of arms yet continues to require max assist to pivot as she was unable to pivot or turn her legs consistently today.    Time 12    Period Weeks    Status On-going    Target Date 06/22/21      PT LONG TERM GOAL #5   Title The patient will demonstrate sitting without UE support for 2-5 minutes at EOB to improve participation and maximize independence with ADLs.    Baseline 10/22/19: Pt able to sit unsupported for at least 2 minutes at edge of bed. 04/21/2020- Patient continues to demo good sitting at edge of mat > 5 miin without difficulty or back support.    Time 12    Period Weeks    Status Achieved      PT LONG TERM GOAL #6   Title Patient will demonstrate improved functional LE strength as seen by consistent ability to stand > 2 min (3 out of 4 trials) with Mod/Max A for improved LE strength with transfers.    Baseline 05/28/2020- Patient able to inconsistent stand 1-2 min right now in //bars with Mod/Max A. 07/02/2020- Patient was able to progress last 2 visit to standing using bilateral platform attachment between 1-2 min which is a progression from the parallel bars. 08/20/2020- Patient performed static standing with min- mod A using bilateral platform attachment on walker for 6 min 30 sec today with assist for trunk control and intermittent verbal cues for posture. 09/24/2020- Patient has consistently been  able to stand >2 min in // bars and when using bilateral Platform walker. 12/23/2020= Will reopen this goal as patient should transition back to weightbearing and again practice standing in new cert. 03/30/2021=1) 1 min 23 sec   2)  10mn 40  sec  3) 1 min 50 sec   Left LE hyperextended with increased difficulty tucking/extending hips today. Requiring more mod assist to remain standing.    Time 12    Period Weeks    Status Deferred    Target Date 06/22/21      PT LONG TERM GOAL #7   Title Patient/caregiver will demonstrate assisted transfer using [place] Lift consistently Independently for improved ability to transfer at home from lift chair to bed.    Baseline 09/24/2020- Patient requires hoyer lift (Dependent) for safe transfers.  12/17/2020- Patient currently still dependent on hoyer as she is currently non-weight bearing due to recent ankle fracture. 03/30/2021- Patient stated she has not tried. Agreeable to attempt in near future.    Time 12    Period Weeks    Status On-going    Target Date 06/22/21      PT LONG TERM GOAL #8   Title Patient will demo improved right LE quad strength as seen by ability to extend knee to 20 deg from 0 or less while performing a Long arc quad (for improved terminal knee ext and more full ROM)  for improved ability to bearing weight with Right LE.    Baseline 12/22/2020=able to extend knee to approx 35 deg from zero during long arc quad; 2/27/203- WIll reassess next visit    Time 12    Period Weeks    Status New    Target Date 06/22/21                   Plan - 06/17/21 1146     Clinical Impression Statement Patient demonstrated easy fatigue overall with gait but much improved ability to stand erect and weight shift. She did experience more difficulty with extending left LE today. Patient will benefit from skilled Physical Therapy services to assist in improving functional mobility and strength to improve patient's quality of life    Personal Factors and  Comorbidities Age;Time since onset of injury/illness/exacerbation;Comorbidity 3+;Fitness    Comorbidities HTN, quadriparesis, history of DVT, neurogenic bladder    Examination-Activity Limitations Bathing;Hygiene/Grooming;Squat;Bed Mobility;Lift;Bend;Stand;Engineer, manufacturing;Toileting;Self Feeding;Transfers;Continence;Sit;Dressing;Sleep;Carry    Examination-Participation Restrictions Church;Laundry;Cleaning;Medication Management;Community Activity;Meal Prep;Interpersonal Relationship    Stability/Clinical Decision Making Evolving/Moderate complexity    Rehab Potential Fair    PT Frequency 2x / week    PT Duration 12 weeks    PT Treatment/Interventions ADLs/Self Care Home Management;Electrical Stimulation;Therapeutic activities;Wheelchair mobility training;Vasopneumatic Device;Joint Manipulations;Vestibular;Passive range of motion;Patient/family education;Therapeutic exercise;DME Instruction;Biofeedback;Aquatic Therapy;Moist Heat;Gait training;Balance training;Orthotic Fit/Training;Dry needling;Energy conservation;Taping;Splinting;Neuromuscular re-education;Cryotherapy;Ultrasound;Functional mobility training    PT Next Visit Plan Continue with progressive standing/transfer training; Progress LE Strengthening. Gait training with lite gait    PT Home Exercise Plan no changes    Consulted and Agree with Plan of Care Patient             Patient will benefit from skilled therapeutic intervention in order to improve the following deficits and impairments:  Abnormal gait, Decreased balance, Decreased endurance, Decreased mobility, Difficulty walking, Hypomobility, Impaired sensation, Decreased range of motion, Improper body mechanics, Impaired perceived functional ability, Decreased activity tolerance, Decreased knowledge of use of DME, Decreased safety awareness, Decreased strength, Impaired flexibility, Impaired UE functional use, Postural dysfunction  Visit Diagnosis: Muscle weakness  (generalized)  Other lack of coordination  Abnormality of gait and mobility  Difficulty in walking, not elsewhere classified  Other abnormalities of gait and mobility  Unsteadiness on feet     Problem List Patient Active Problem List   Diagnosis Date Noted  Acute appendicitis with appendiceal abscess 05/30/2018   Hypocalcemia 02/15/2018   Dysuria 02/15/2018   Bilateral lower extremity edema 02/11/2018   Vaginal yeast infection 01/30/2018   Chest tightness 01/27/2018   Healthcare-associated pneumonia 01/25/2018   Pleural effusion, not elsewhere classified 01/25/2018   Acute deep vein thrombosis (DVT) of left lower extremity ([place]) 01/24/2018   Acute deep vein thrombosis (DVT) of right lower extremity ([place]) 01/24/2018   Chronic allergic rhinitis 01/24/2018   Depression with anxiety 01/24/2018   UTI due to Klebsiella species 01/24/2018   Protein-calorie malnutrition, severe ([place]) 01/24/2018   S/P insertion of IVC (inferior vena caval) filter 01/24/2018   Reactive depression    Hypokalemia    Leukocytosis    Essential hypertension    Trauma    Tetraparesis ([facility])    Neuropathic pain    Neurogenic bowel    Neurogenic bladder    Benign essential HTN    Acute blood loss anemia    Central cord syndrome at C6 level of cervical spinal cord ([place]) 11/29/2017   Central cord syndrome ([place]) 11/29/2017    Lewis Moccasin, PT 06/18/2021, 5:32 PM  [place] MAIN [facility] SERVICES [place], [place], [address] Phone: [phone]   Fax:  [phone]  Name: LAVONYA HOERNER MRN: [id number] Date of Birth: July 19, 1934

## 2021-06-17 NOTE — Therapy (Signed)
[facility] ?[place], [place], [address] ?Phone: [phone]   Fax:  [phone] ? ?Occupational Therapy Treatment ? ?Patient Details  ?Name: Sherry [name] ?MRN: [id number] ?Date of Birth: 1934-02-23 ?No data recorded ? ?Encounter Date: 06/17/2021 ? ? OT End of Session - 06/17/21 1212   ? ? Visit Number [id number]   ? Number of Visits 154   ? Date for OT Re-Evaluation 07/01/21   ? Authorization Time Period Progress report period starting 11/26/2020   ? OT Start Time 1100   ? OT Stop Time 1145   ? OT Time Calculation (min) 45 min   ? Activity Tolerance Patient tolerated treatment well   ? Behavior During Therapy [facility] for tasks assessed/performed   ? ?  ?  ? ?  ? ? ?Past Medical History:  ?Diagnosis Date  ? Acute blood loss anemia   ? Arthritis   ? Cancer [facility])   ? skin  ? Central cord syndrome at C6 level of cervical spinal cord ([place]) 11/29/2017  ? Hypertension   ? Protein-calorie malnutrition, severe ([place]) 01/24/2018  ? S/P insertion of IVC (inferior vena caval) filter 01/24/2018  ? Tetraparesis ([place])   ? ? ?Past Surgical History:  ?Procedure Laterality Date  ? ANTERIOR CERVICAL DECOMP/DISCECTOMY FUSION N/A 11/29/2017  ? Procedure: Cervical five-six, six-seven Anterior Cervical Decompression Fusion;  Surgeon: Judith Part, MD;  Location: [place];  Service: Neurosurgery;  Laterality: N/A;  Cervical five-six, six-seven Anterior Cervical Decompression Fusion  ? CATARACT EXTRACTION    ? IR IVC FILTER PLMT / S&I /IMG GUID/MOD SED  12/08/2017  ? ? ?There were no vitals filed for this visit. ? ? Subjective Assessment - 06/17/21 1211   ? ? Subjective  Pt reports doing well today   ? Patient is accompanied by: Family member   ? Pertinent History Patient s/p fall November 29, 2017 resulting in diagnosis of central cord syndrome at C 6 level.  She has had therapy in multiple venues but with recent move to [facility], therapy staff recommended she seek  outpatient therapy for her needs.   ? Patient Stated Goals Patient would like to be able to move better in bed, stand and perform self care tasks.   ? Currently in Pain? No/denies   ? ?  ?  ? ?  ? ? ?OT TREATMENT   ?   ?Therapeutic Ex. ?  ?Pt. tolerated PROM followed by AROM bilateral wrist extension, PROM for bilateral digit MP, PIP, and DIP flexion, and extension, thumb radial, and palmar abduction in conjunction with moist heat. Emphasis was placed on left wrist extension, bilateral thumb radial, and palmar abduction, and bilateral digit MP, PIP, and DIP flexion. Pt. worked on reaching across midline for cones. Pt./caregiver education was provided about sustained passive digit stretching using a ball, and and ACE wrap. Recommend pt. Tolerated time of 8-10 min. each hand. Pt. worked on [employer], and reciprocal motion using the UBE while sitting for 4 min. with minimal resistance. Pt ?  ?Manual Therapy: ?  ?Pt. tolerated soft tissue massage to the volar, and dorsal aspect of the bilateral 2nd through 5th digits for stiffness independent of, and in preparation for therapeutic Ex.  ?  ?Pt. continues to present with bilateral wrist, and digit tightness, and continues to work towards improving with reaching across midline. Pt. tolerated manual therapy well. Pt. continues to tolerate the UBE without difficulty with increasing resistance. Pt. tolerated ROM  well without reports of pain, or discomfort following moist heat modality. Pt. presents with tightness at the bilateral thumb webspace. Pt. continues to present with bilateral wrist flexor tightness, and digit MP, PIP, and DIP extensor tightness. Pt. continues to benefit from working on impoving ROM in order to work towards increasing engagement of her bilateral hands during ADLs, and IADL tasks.  ?  ?  ?  ?  ? ? ? ? ? ? ? ? ? ? ? ? ? ? ? ? ? ? ? ? OT Education - 06/17/21 1212   ? ? Education Details Pt./caregiver: use of ACE wrap to secure R hand digits  around stress ball for gentle, prolonged stretch for closing hand   ? Person(s) Educated Patient   ? Comprehension Verbalized understanding;Need further instruction;Verbal cues required   ? ?  ?  ? ?  ? ? ? ? ? ? OT Long Term Goals - 06/01/21 1108   ? ?  ? OT LONG TERM GOAL #1  ? Title Patient and caregiver will demonstrate understanding of home exercise program for ROM.   ? Baseline 06/01/2021: Pt. now has new restorative aides who are assiting with daily ROM at [facility] now. Pt. has recently been in quarantine for COVID-19. Pt.'s current restorative aide is retiring at the end of December. Pt. continues to have a restorative aide rehab aide assist her wih exercises at [facility]. 8/8: Independently completes/directs HEP mulitple times/week. 12/17/2020: Pt. has a new restorative aide who will be resuming ROM program.   ? Time 12   ? Period Weeks   ? Status Achieved   ? Target Date 07/01/21   ?  ? OT LONG TERM GOAL #4  ? Title Patient will demonstrate improved composite finger flexion to hold deodorant to apply to underarms.   ? Baseline 01/12/2021:  Pt. is progressing, and is able to reach to grasp, and hold narrow cones. 10/20/2020: Pt. conitnues to make steady progress with bilateral digit flexion.Pt. continues to present with stiifness/tightness which is hindering pt.'s ability to formulate a full composite fist. Pt. continues to present with improving finger flexion of R hand for a partial gross grasp, but unable to grasp and hold items. pt. conitnues to present with limited digit flexion of L hand. Pt. has active left thumb abduction. 8/8: holds cup with 2 hands, continues to have limited composite finger flexion L worse than R. 02/16/2021: pt. continues to present with limited bilateral gross composite fisting. 04/08/2021: pt. continues to present with limited digit flexion affecting her ability to formulate bilateral composite fisting. 110th visivt: Pt. is able to hold the utensils in preparation for  self-feeding. 06/01/2021: continues to have difficulty formulating a fist, however has improved with self-feeding, and is now using standard utensils.   ? Time 12   ? Period Weeks   ? Status On-going   ? Target Date 07/01/21   ?  ? OT LONG TERM GOAL #6  ? Title Patient will increase right UE ROM to comb the back of her hair with modified independence.   ? Baseline 01/12/2022: Pt. is able to comb her hair, reaching to the back of her hair.. 10/20/2020: Pt. continues to progress wtith RRUE ROM, and is able to use a pic for the back of her head, however is unable to brush the back of her hair. 07/30/2020: Pt. is now able to comb the back of her hair with a pic, however continues to work on completing the task efficiently.  8/8: Achieves with great effort, has assistance for efficiancy. 02/16/2021: Pt. is now able to reach, and brush the back of her hair. 04/08/2021: pt. continues to improve reach towards the back of her hair. 110th visit: Pt. is to reach towards the back of her hair, however has difficulty sustaining her arms in elevaton for the entire duration of perfroming hair cre thoroughly. 06/01/2021: Pt. is able to reach to the back of her head, however has difficutly with thoroughness.   ? Time 12   ? Period Weeks   ? Status Partially Met   ? Target Date 07/01/21   ?  ? OT LONG TERM GOAL #8  ? Title Pt. will write, and sign her name with 100% legibility, and modified independence.   ? Baseline 01/12/2021: Pt. continnues to fill out her daily menu and complete puzzles. 10/20/2020: Pt. continues to make progress overall. Pt. continues to consistently fill out her daily menu, and writing for crossword puzzles. Pt. is able to maintain grasp on a wide width pen. Contineus to work on [employer]. 8/8: writes daily, continues to work on improving speed. 02/16/2021: Pt. continues to present wiht limited writing legibility, fluctuating daily. 04/08/2021: Pt. continues to to be able to fill out her daily menu.limited  writing legibility for daily puzzles.110th visit: Pt. continues to present with limited legibility, and efficiency for completing daily puzzles. 06/01/2021: pt. continues to work on the consistency with legibility.   ? Time 12

## 2021-06-22 ENCOUNTER — Ambulatory Visit: Payer: Medicare HMO

## 2021-06-22 ENCOUNTER — Ambulatory Visit: Payer: Medicare HMO | Admitting: Occupational Therapy

## 2021-06-22 ENCOUNTER — Encounter: Payer: Self-pay | Admitting: Occupational Therapy

## 2021-06-22 DIAGNOSIS — R262 Difficulty in walking, not elsewhere classified: Secondary | ICD-10-CM

## 2021-06-22 DIAGNOSIS — R2681 Unsteadiness on feet: Secondary | ICD-10-CM

## 2021-06-22 DIAGNOSIS — M6281 Muscle weakness (generalized): Secondary | ICD-10-CM

## 2021-06-22 DIAGNOSIS — R269 Unspecified abnormalities of gait and mobility: Secondary | ICD-10-CM

## 2021-06-22 DIAGNOSIS — R278 Other lack of coordination: Secondary | ICD-10-CM

## 2021-06-22 DIAGNOSIS — R2689 Other abnormalities of gait and mobility: Secondary | ICD-10-CM

## 2021-06-22 NOTE — Therapy (Signed)
[place] MAIN [facility] SERVICES [address] [place], [place], [address] Phone: [phone]   Fax:  [phone]  Occupational Therapy Treatment  Patient Details  Name: Sherry [name] MRN: [id number] Date of Birth: 16-Oct-1934 No data recorded  Encounter Date: 06/22/2021   OT End of Session - 06/22/21 1213     Visit Number [id number]    Number of Visits 154    Date for OT Re-Evaluation 07/01/21    Authorization Time Period Progress report period starting 11/26/2020    OT Start Time 1103    OT Stop Time 1145    OT Time Calculation (min) 42 min    Activity Tolerance Patient tolerated treatment well    Behavior During Therapy [facility] for tasks assessed/performed             Past Medical History:  Diagnosis Date   Acute blood loss anemia    Arthritis    Cancer ([place])    skin   Central cord syndrome at C6 level of cervical spinal cord ([place]) 11/29/2017   Hypertension    Protein-calorie malnutrition, severe ([place]) 01/24/2018   S/P insertion of IVC (inferior vena caval) filter 01/24/2018   Tetraparesis ([place])     Past Surgical History:  Procedure Laterality Date   ANTERIOR CERVICAL DECOMP/DISCECTOMY FUSION N/A 11/29/2017   Procedure: Cervical five-six, six-seven Anterior Cervical Decompression Fusion;  Surgeon: Judith Part, MD;  Location: [place];  Service: Neurosurgery;  Laterality: N/A;  Cervical five-six, six-seven Anterior Cervical Decompression Fusion   CATARACT EXTRACTION     IR IVC FILTER PLMT / S&I /IMG GUID/MOD SED  12/08/2017    There were no vitals filed for this visit.   Subjective Assessment - 06/22/21 1212     Subjective  Pt. reports doing well today.    Patient is accompanied by: Family member    Pertinent History Patient s/p fall November 29, 2017 resulting in diagnosis of central cord syndrome at C 6 level.  She has had therapy in multiple venues but with recent move to [facility], therapy staff recommended she seek  outpatient therapy for her needs.    Currently in Pain? No/denies            OT TREATMENT      Therapeutic Ex.   Pt. tolerated PROM followed by AROM bilateral wrist extension, PROM for bilateral digit MP, PIP, and DIP flexion, and extension, thumb radial, and palmar abduction in conjunction with moist heat. Emphasis was placed on left wrist extension, bilateral thumb radial, and palmar abduction, and bilateral digit MP, PIP, and DIP flexion. Pt. Worked on BUE strengthening, and reciprocal motion using the UBE while seated for 8 min. with min/mod resistance. Constant monitoring was provided.    Manual Therapy:   Pt. tolerated soft tissue massage to the volar, and dorsal aspect of the bilateral 2nd through 5th digits for digit PIP extensor stiffness. Manual Therapy was performed independent of, and in preparation for therapeutic Ex.    Pt. continues to present with bilateral wrist, and digit tightness.  Pt. tolerated manual therapy well. Pt. continues to tolerate the UBE without difficulty with increasing resistance. Pt. tolerated ROM well without reports of pain, or discomfort following moist heat modality. Pt. continues to present with tightness at the bilateral thumb webspace. Pt. continues to present with bilateral wrist flexor tightness, and digit MP, PIP, and DIP extensor tightness. Pt. continues to benefit from working on impoving ROM in order to work towards increasing engagement  of her bilateral hands during ADLs, and IADL tasks.                            OT Education - 06/22/21 1213     Education Details BUE ROM, strengthening    Person(s) Educated Patient    Methods Explanation;Demonstration;Verbal cues    Comprehension Verbalized understanding;Need further instruction;Verbal cues required                 OT Long Term Goals - 06/01/21 1108       OT LONG TERM GOAL #1   Title Patient and caregiver will demonstrate understanding of home exercise  program for ROM.    Baseline 06/01/2021: Pt. now has new restorative aides who are assiting with daily ROM at [facility] now. Pt. has recently been in quarantine for COVID-19. Pt.'s current restorative aide is retiring at the end of December. Pt. continues to have a restorative aide rehab aide assist her wih exercises at [facility]. 8/8: Independently completes/directs HEP mulitple times/week. 12/17/2020: Pt. has a new restorative aide who will be resuming ROM program.    Time 12    Period Weeks    Status Achieved    Target Date 07/01/21      OT LONG TERM GOAL #4   Title Patient will demonstrate improved composite finger flexion to hold deodorant to apply to underarms.    Baseline 01/12/2021:  Pt. is progressing, and is able to reach to grasp, and hold narrow cones. 10/20/2020: Pt. conitnues to make steady progress with bilateral digit flexion.Pt. continues to present with stiifness/tightness which is hindering pt.'s ability to formulate a full composite fist. Pt. continues to present with improving finger flexion of R hand for a partial gross grasp, but unable to grasp and hold items. pt. conitnues to present with limited digit flexion of L hand. Pt. has active left thumb abduction. 8/8: holds cup with 2 hands, continues to have limited composite finger flexion L worse than R. 02/16/2021: pt. continues to present with limited bilateral gross composite fisting. 04/08/2021: pt. continues to present with limited digit flexion affecting her ability to formulate bilateral composite fisting. 110th visivt: Pt. is able to hold the utensils in preparation for self-feeding. 06/01/2021: continues to have difficulty formulating a fist, however has improved with self-feeding, and is now using standard utensils.    Time 12    Period Weeks    Status On-going    Target Date 07/01/21      OT LONG TERM GOAL #6   Title Patient will increase right UE ROM to comb the back of her hair with modified independence.    Baseline  01/12/2022: Pt. is able to comb her hair, reaching to the back of her hair.. 10/20/2020: Pt. continues to progress wtith RRUE ROM, and is able to use a pic for the back of her head, however is unable to brush the back of her hair. 07/30/2020: Pt. is now able to comb the back of her hair with a pic, however continues to work on completing the task efficiently. 8/8: Achieves with great effort, has assistance for efficiancy. 02/16/2021: Pt. is now able to reach, and brush the back of her hair. 04/08/2021: pt. continues to improve reach towards the back of her hair. 110th visit: Pt. is to reach towards the back of her hair, however has difficulty sustaining her arms in elevaton for the entire duration of perfroming hair cre thoroughly. 06/01/2021: Pt.  is able to reach to the back of her head, however has difficutly with thoroughness.    Time 12    Period Weeks    Status Partially Met    Target Date 07/01/21      OT LONG TERM GOAL #8   Title Pt. will write, and sign her name with 100% legibility, and modified independence.    Baseline 01/12/2021: Pt. continnues to fill out her daily menu and complete puzzles. 10/20/2020: Pt. continues to make progress overall. Pt. continues to consistently fill out her daily menu, and writing for crossword puzzles. Pt. is able to maintain grasp on a wide width pen. Contineus to work on [employer]. 8/8: writes daily, continues to work on improving speed. 02/16/2021: Pt. continues to present wiht limited writing legibility, fluctuating daily. 04/08/2021: Pt. continues to to be able to fill out her daily menu.limited writing legibility for daily puzzles.110th visit: Pt. continues to present with limited legibility, and efficiency for completing daily puzzles. 06/01/2021: pt. continues to work on the consistency with legibility.    Time 12    Period Weeks    Status Partially Met    Target Date 07/01/21                   Plan - 06/22/21 1214     Clinical Impression  Statement Pt. continues to present with bilateral wrist, and digit tightness.  Pt. tolerated manual therapy well. Pt. continues to tolerate the UBE without difficulty with increasing resistance. Pt. tolerated ROM well without reports of pain, or discomfort following moist heat modality. Pt. continues to present with tightness at the bilateral thumb webspace. Pt. continues to present with bilateral wrist flexor tightness, and digit MP, PIP, and DIP extensor tightness. Pt. continues to benefit from working on impoving ROM in order to work towards increasing engagement of her bilateral hands during ADLs, and IADL tasks.    OT Occupational Profile and History Detailed Assessment- Review of Records and additional review of physical, cognitive, psychosocial history related to current functional performance    Occupational performance deficits (Please refer to evaluation for details): ADL's;IADL's;Leisure;Social Participation    Body Structure / Function / Physical Skills ADL;Continence;Dexterity;Flexibility;Strength;ROM;Balance;Coordination;FMC;IADL;Endurance;UE functional use;Decreased knowledge of use of DME;GMC    Psychosocial Skills Coping Strategies    Rehab Potential Fair    Clinical Decision Making Several treatment options, min-mod task modification necessary    Comorbidities Affecting Occupational Performance: Presence of comorbidities impacting occupational performance    Modification or Assistance to Complete Evaluation  No modification of tasks or assist necessary to complete eval    OT Frequency 2x / week    OT Duration 12 weeks    OT Treatment/Interventions Self-care/ADL training;Cryotherapy;Therapeutic exercise;DME and/or AE instruction;Balance training;Neuromuscular education;Manual Therapy;Splinting;Moist Heat;Passive range of motion;Therapeutic activities;Patient/family education    Plan continue to progress ROM of digits, wrists and shoulders as it pertains to completion of ADL tasks that pt  values.    Consulted and Agree with Plan of Care Patient             Patient will benefit from skilled therapeutic intervention in order to improve the following deficits and impairments:   Body Structure / Function / Physical Skills: ADL, Continence, Dexterity, Flexibility, Strength, ROM, Balance, Coordination, FMC, IADL, Endurance, UE functional use, Decreased knowledge of use of DME, GMC   Psychosocial Skills: Coping Strategies   Visit Diagnosis: Muscle weakness (generalized)  Other lack of coordination    Problem List Patient Active Problem List  Diagnosis Date Noted   Acute appendicitis with appendiceal abscess 05/30/2018   Hypocalcemia 02/15/2018   Dysuria 02/15/2018   Bilateral lower extremity edema 02/11/2018   Vaginal yeast infection 01/30/2018   Chest tightness 01/27/2018   Healthcare-associated pneumonia 01/25/2018   Pleural effusion, not elsewhere classified 01/25/2018   Acute deep vein thrombosis (DVT) of left lower extremity ([place]) 01/24/2018   Acute deep vein thrombosis (DVT) of right lower extremity ([place]) 01/24/2018   Chronic allergic rhinitis 01/24/2018   Depression with anxiety 01/24/2018   UTI due to Klebsiella species 01/24/2018   Protein-calorie malnutrition, severe ([place]) 01/24/2018   S/P insertion of IVC (inferior vena caval) filter 01/24/2018   Reactive depression    Hypokalemia    Leukocytosis    Essential hypertension    Trauma    Tetraparesis ([facility])    Neuropathic pain    Neurogenic bowel    Neurogenic bladder    Benign essential HTN    Acute blood loss anemia    Central cord syndrome at C6 level of cervical spinal cord ([place]) 11/29/2017   Central cord syndrome ([place]) 11/29/2017   Harrel Carina, MS, OTR/L   Harrel Carina, OT 06/22/2021, 12:15 PM  [place] MAIN [facility] SERVICES [address] [place], [place], [address] Phone: [phone]   Fax:  [phone]  Name: AHTZIRY SAATHOFF MRN: [id number] Date of Birth: 22-Mar-1934

## 2021-06-22 NOTE — Therapy (Signed)
[facility] [address] [place], [place], [address] Phone: [phone]   Fax:  [phone]  Physical Therapy Treatment  Patient Details  Name: Sherry [name] MRN: [id number] Date of Birth: 01/11/1935 No data recorded  Encounter Date: 06/22/2021   PT End of Session - 06/22/21 1315     Visit Number 147    Number of Visits 153    Authorization Type [employer] FOTO performed by PT on eval (7/20), score 12, Progress note on 03/10/2020; PN on 08/20/2020    Authorization Time Period Recert 07/02/2020- 09/24/2020; PN on 07/07/2020; Recert 09/24/2020- 12/17/2020; PN on 10/01/2020; Recert 12/22/2020- 03/16/2021; Recert 03/30/2021-06/22/2021    Progress Note Due on Visit 150    PT Start Time 1147    PT Stop Time 1227    PT Time Calculation (min) 40 min    Activity Tolerance Patient tolerated treatment well;Patient limited by fatigue    Behavior During Therapy WFL for tasks assessed/performed             Past Medical History:  Diagnosis Date   Acute blood loss anemia    Arthritis    Cancer (HCC)    skin   Central cord syndrome at C6 level of cervical spinal cord (HCC) 11/29/2017   Hypertension    Protein-calorie malnutrition, severe (HCC) 01/24/2018   S/P insertion of IVC (inferior vena caval) filter 01/24/2018   Tetraparesis (HCC)     Past Surgical History:  Procedure Laterality Date   ANTERIOR CERVICAL DECOMP/DISCECTOMY FUSION N/A 11/29/2017   Procedure: Cervical five-six, six-seven Anterior Cervical Decompression Fusion;  Surgeon: Ostergard, Thomas A, MD;  Location: [facility] OR;  Service: Neurosurgery;  Laterality: N/A;  Cervical five-six, six-seven Anterior Cervical Decompression Fusion   CATARACT EXTRACTION     IR IVC FILTER PLMT / S&I /IMG GUID/MOD SED  12/08/2017    There were no vitals filed for this visit.   Subjective Assessment - 06/22/21 1312     Subjective Patient reports having a lazy weekend and states she didn't  do much. She is agreeable to try to stand and walk with use of litegait today.    Pertinent History Pt is an 87 yo female that fell in 2019, fractured vertebrae in her neck and in her low back per family. 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 After hospital stay, pt discharged to [facility] for ~1 month, experienced severe UTI as well as bilateral DVT (IVC filter placed). Discharged to [facility], stayed for about a year, and then moved to [place] in April 2021. Was receiving PT, but per family facility reported that did not have adequate equipment to maximize PT for her. Pt until about 1 month ago was practicing sit to stand transfers with 1-2 people, and working on static standing. Has a brace for L foot due to PF. Pt currently needs assistance with all ADLs (able to assist with donning/doffing her shirt), bed baths, and needs a hoyer lift for transfers. Able to drive power wheelchair without assistance.    Currently in Pain? No/denies              INTERVENTIONS:    Therapeutic Activities:    Patient performed stand pivot transfer from power w/c to mat table- Mod- max assist to stand and pivot today over to table.* Patient was able to pick her right foot up well with VC today and actually pivot without PT assist with right LE.    Patient was   able to roll left and right with min assist to don the harness of litegait and then able to sit back up at edge of bed with max assist with LE and min assist with upper trunk.      Gait training:  Using Litegait unweighing system  Patient was able to stand with use of lift and no issues with padding/harness today  She  ambulated in clinic approx 75 feet - VC for step length and mild difficulty with foot placement and right LE advancement at times. Patient  was able take more reciprocal steps today with her assistance able to pull the litegait while PT maintains CGA.  Patient was limited by fatigue      Education provided throughout  session via VC/TC and demonstration to facilitate movement at target joints and correct muscle activation for all testing and exercises performed.                        PT Education - 06/22/21 1313     Education Details Gait and postural cues with standing/walking    Person(s) Educated Patient    Methods Explanation;Tactile cues;Demonstration;Verbal cues    Comprehension Returned demonstration;Verbal cues required;Verbalized understanding;Tactile cues required;Need further instruction              PT Short Term Goals - 03/10/20 2211       PT SHORT TERM GOAL #1   Title The patient will perform initial HEP with minimum assistance in order to improve strength and function.    Baseline Patient demonstrating independence with initial HEP as of 03/10/2020 with no questions or difficulty.    Time 4    Period Weeks    Status Achieved    Target Date 02/11/20               PT Long Term Goals - 03/30/21 1150       PT LONG TERM GOAL #1   Title The patient will be compliant with finalized HEP with minimum assistance in preparation for self management and maintenance of condition.    Baseline 03/10/2020- Patient familiar with initial HEP and will keep goal active as HEP becomes more progressive including possible transfers and standing activities. 04/07/2020- Patient continues to report compliance with LE strengthening home exercises without questions or concerns. 04/21/2020- Patient able to verbalize and demonstrate good understanding of current HEP including some supine and seated LE strengthening exercises. Reviewed and patient performed 10 reps today with minimal cueing. Will keep goal active to progress HEP as appropriate. 05/28/2020- patient reports continues to perform LE strengthening as able and some help from caregiver as able. No changes at this time to home program as patient is limited to supine/seated therex. 07/02/2020- Patient continues to report compliance  with current supine and seated LE strengtheing home program and states no questions or concerns regarding her plan.    Time 12    Period Weeks    Status Achieved      PT LONG TERM GOAL #2   Title The patient will demonstrate at least 10 point improvement on FOTO score indicating an improved ability to perform functional activities.    Baseline on eval (/720) score 12; 10/22/19: 12; 12/10/19: 18, 12/13: 18: 04/07/2020- Will assess next visit. 07/02/2020- Will obtain next visit. 09/24/2020= Will obtain next visit. 11/12/2020= 12; 12/22/2020= 12; 03/30/2021= 17    Time 12    Period Weeks    Status On-going    Target Date 06/22/21  PT LONG TERM GOAL #3   Title The patient will demonstrate lateral scooting  for assistance with transfers with minA to maximize independence.    Baseline 10/22/19: Pt requires modA+1 for lateral scooting; 12/10/19: modA+1, 12/13: modA-maxA, 12/27: modA-maxA, 03/10/2020- ModA-MaxA- increased verbal cues and visual demonstration. 04/07/2020- Continued Max assist to perform forward/lateral scoot. 04/21/2020- Patient was able to demo slight lateral scoot with CGA approx 12 in then fatigued requiring max assist- she is able to scoot forward by leaning without significant issues. 05/28/2020- While sitting in chair- patient able to perform forward and lateral scoot with VC/visual demo and increased time allowed- CGA today but not consistent yet- will keep goal active at this time to continue to focus on strengthening and improving technique. 07/02/2020- Patient able to demonstrate minimal ability to laterally shift weigh on mat table requiring max assist yet is able to forward scoot out to edge of mat table with min Assist. 08/20/2020 - Patient was able to demonstrate minimal lateral scooting at edge of mat while holding feet still so they did not slip on tile surface. She continues to be able to demonstrate scoot forward and uses tilt option to recline to position safely back into seat of power  wheelchair. 09/25/2020- Patient able to sit on mat and minimally scoot laterally but requires min/mod A for efficient scooting. Will end goal at this time due to plateau in progress with scooting over extended time.    Time 12    Period Weeks    Status Not Met      PT LONG TERM GOAL #4   Title The patient will demonstrate a squat pivot transfer with minimum assistance to maximize independence and mobility.    Baseline 10/22/19: unable to safely attempt at this time; 12/10/19: unable to safely attempt at this time, 01/14/20: unable to safety attempt at this time. 03/10/2020- Patient able to perform Stand pivot transfer with Maximal assist. 04/07/2020- Patient continues to require Max assist with sit to stand- will assist SPT next visit. 04/21/2020- Patient presents with Max assist to perform Sit to stand Transfer- unable to pivot or move Left LE well without difficulty. 07/02/2020= Max assist with SPT and minimal ability to pivot. 7/202/2022-Patient able to stand with minimal assist this visit (as good as CGA last visit) and able to move right foot to pivot and performing with moderate assist today. 09/24/2020- Patient able to perform sit to stand with Min assist from lift chair. Patient has demo inconsistency and will keep goal active.  12/17/2020- Goal is currently not appropriate as patient fractured her ankle and currently NWB. 12/22/2020=Goal is currently not appropriate as patient fractured her ankle and currently NWB.  03/30/2021=Patient was able to demo min assist to stand holding onto PT back of arms yet continues to require max assist to pivot as she was unable to pivot or turn her legs consistently today.    Time 12    Period Weeks    Status On-going    Target Date 06/22/21      PT LONG TERM GOAL #5   Title The patient will demonstrate sitting without UE support for 2-5 minutes at EOB to improve participation and maximize independence with ADLs.    Baseline 10/22/19: Pt able to sit unsupported for at  least 2 minutes at edge of bed. 04/21/2020- Patient continues to demo good sitting at edge of mat > 5 miin without difficulty or back support.    Time 12    Period Weeks  Status Achieved      PT LONG TERM GOAL #6   Title Patient will demonstrate improved functional LE strength as seen by consistent ability to stand > 2 min (3 out of 4 trials) with Mod/Max A for improved LE strength with transfers.    Baseline 05/28/2020- Patient able to inconsistent stand 1-2 min right now in //bars with Mod/Max A. 07/02/2020- Patient was able to progress last 2 visit to standing using bilateral platform attachment between 1-2 min which is a progression from the parallel bars. 08/20/2020- Patient performed static standing with min- mod A using bilateral platform attachment on walker for 6 min 30 sec today with assist for trunk control and intermittent verbal cues for posture. 09/24/2020- Patient has consistently been able to stand >2 min in // bars and when using bilateral Platform walker. 12/23/2020= Will reopen this goal as patient should transition back to weightbearing and again practice standing in new cert. 03/30/2021=1) 1 min 23 sec   2)  1min 40 sec  3) 1 min 50 sec   Left LE hyperextended with increased difficulty tucking/extending hips today. Requiring more mod assist to remain standing.    Time 12    Period Weeks    Status Deferred    Target Date 06/22/21      PT LONG TERM GOAL #7   Title Patient/caregiver will demonstrate assisted transfer using [place] Lift consistently Independently for improved ability to transfer at home from lift chair to bed.    Baseline 09/24/2020- Patient requires hoyer lift (Dependent) for safe transfers.  12/17/2020- Patient currently still dependent on hoyer as she is currently non-weight bearing due to recent ankle fracture. 03/30/2021- Patient stated she has not tried. Agreeable to attempt in near future.    Time 12    Period Weeks    Status On-going    Target Date 06/22/21       PT LONG TERM GOAL #8   Title Patient will demo improved right LE quad strength as seen by ability to extend knee to 20 deg from 0 or less while performing a Long arc quad (for improved terminal knee ext and more full ROM)  for improved ability to bearing weight with Right LE.    Baseline 12/22/2020=able to extend knee to approx 35 deg from zero during long arc quad; 2/27/203- WIll reassess next visit    Time 12    Period Weeks    Status New    Target Date 06/22/21                   Plan - 06/22/21 1322     Clinical Impression Statement Patient presents with good motivation and demonstrated improvement with transfer ability as well as gait today. She was able to move her right LE to pivot turn in standing position well today. She was also able to take more reciprocal steps with her personal care assist pulling the litegait. She did experience increased fatigue with increased distance. Patient will benefit from skilled Physical Therapy services to assist in improving functional mobility and strength to improve patient's quality of life    Personal Factors and Comorbidities Age;Time since onset of injury/illness/exacerbation;Comorbidity 3+;Fitness    Comorbidities HTN, quadriparesis, history of DVT, neurogenic bladder    Examination-Activity Limitations Bathing;Hygiene/Grooming;Squat;Bed Mobility;Lift;Bend;Stand;Locomotion Level;Reach Overhead;Toileting;Self Feeding;Transfers;Continence;Sit;Dressing;Sleep;Carry    Examination-Participation Restrictions Church;Laundry;Cleaning;Medication Management;Community Activity;Meal Prep;Interpersonal Relationship    Stability/Clinical Decision Making Evolving/Moderate complexity    Rehab Potential Fair    PT Frequency 2x / week      PT Duration 12 weeks    PT Treatment/Interventions ADLs/Self Care Home Management;Electrical Stimulation;Therapeutic activities;Wheelchair mobility training;Vasopneumatic Device;Joint Manipulations;Vestibular;Passive range  of motion;Patient/family education;Therapeutic exercise;DME Instruction;Biofeedback;Aquatic Therapy;Moist Heat;Gait training;Balance training;Orthotic Fit/Training;Dry needling;Energy conservation;Taping;Splinting;Neuromuscular re-education;Cryotherapy;Ultrasound;Functional mobility training    PT Next Visit Plan Continue with progressive standing/transfer training; Progress LE Strengthening. Gait training with lite gait    PT Home Exercise Plan no changes    Consulted and Agree with Plan of Care Patient             Patient will benefit from skilled therapeutic intervention in order to improve the following deficits and impairments:  Abnormal gait, Decreased balance, Decreased endurance, Decreased mobility, Difficulty walking, Hypomobility, Impaired sensation, Decreased range of motion, Improper body mechanics, Impaired perceived functional ability, Decreased activity tolerance, Decreased knowledge of use of DME, Decreased safety awareness, Decreased strength, Impaired flexibility, Impaired UE functional use, Postural dysfunction  Visit Diagnosis: Muscle weakness (generalized)  Other lack of coordination  Abnormality of gait and mobility  Difficulty in walking, not elsewhere classified  Other abnormalities of gait and mobility  Unsteadiness on feet     Problem List Patient Active Problem List   Diagnosis Date Noted   Acute appendicitis with appendiceal abscess 05/30/2018   Hypocalcemia 02/15/2018   Dysuria 02/15/2018   Bilateral lower extremity edema 02/11/2018   Vaginal yeast infection 01/30/2018   Chest tightness 01/27/2018   Healthcare-associated pneumonia 01/25/2018   Pleural effusion, not elsewhere classified 01/25/2018   Acute deep vein thrombosis (DVT) of left lower extremity ([place]) 01/24/2018   Acute deep vein thrombosis (DVT) of right lower extremity ([place]) 01/24/2018   Chronic allergic rhinitis 01/24/2018   Depression with anxiety 01/24/2018   UTI due to Klebsiella  species 01/24/2018   Protein-calorie malnutrition, severe ([place]) 01/24/2018   S/P insertion of IVC (inferior vena caval) filter 01/24/2018   Reactive depression    Hypokalemia    Leukocytosis    Essential hypertension    Trauma    Tetraparesis (HCC)    Neuropathic pain    Neurogenic bowel    Neurogenic bladder    Benign essential HTN    Acute blood loss anemia    Central cord syndrome at C6 level of cervical spinal cord ([place]) 11/29/2017   Central cord syndrome ([place]) 11/29/2017    Lewis Moccasin, PT 06/22/2021, 1:34 PM  [place] [facility] [address] [place], [place], [address] Phone: [phone]   Fax:  [phone]  Name: Sherry [name] MRN: [id number] Date of Birth: July 28, 1934

## 2021-06-24 ENCOUNTER — Encounter: Payer: Self-pay | Admitting: Occupational Therapy

## 2021-06-24 ENCOUNTER — Ambulatory Visit: Payer: Medicare HMO | Admitting: Occupational Therapy

## 2021-06-24 ENCOUNTER — Ambulatory Visit: Payer: Medicare HMO

## 2021-06-24 DIAGNOSIS — M6281 Muscle weakness (generalized): Secondary | ICD-10-CM

## 2021-06-24 DIAGNOSIS — R262 Difficulty in walking, not elsewhere classified: Secondary | ICD-10-CM

## 2021-06-24 DIAGNOSIS — R278 Other lack of coordination: Secondary | ICD-10-CM

## 2021-06-24 DIAGNOSIS — R2681 Unsteadiness on feet: Secondary | ICD-10-CM

## 2021-06-24 DIAGNOSIS — R269 Unspecified abnormalities of gait and mobility: Secondary | ICD-10-CM

## 2021-06-24 DIAGNOSIS — R2689 Other abnormalities of gait and mobility: Secondary | ICD-10-CM

## 2021-06-24 NOTE — Therapy (Signed)
[place] MAIN [facility] SERVICES [address] [place], [place], [address] Phone: [phone]   Fax:  [phone]  Occupational Therapy Treatment  Patient Details  Name: Sherry [name] MRN: [id number] Date of Birth: Mar 10, 1934 No data recorded  Encounter Date: 06/24/2021      OT End of Session - 06/24/21 1216     Visit Number 126    Number of Visits 154    Date for OT Re-Evaluation 07/01/21    OT Start Time 1100    OT Stop Time 1145    OT Time Calculation (min) 45 min    Equipment Utilized During Treatment moist heat    Activity Tolerance Patient tolerated treatment well    Behavior During Therapy [facility] for tasks assessed/performed             Past Medical History:  Diagnosis Date   Acute blood loss anemia    Arthritis    Cancer ([place])    skin   Central cord syndrome at C6 level of cervical spinal cord ([place]) 11/29/2017   Hypertension    Protein-calorie malnutrition, severe ([place]) 01/24/2018   S/P insertion of IVC (inferior vena caval) filter 01/24/2018   Tetraparesis ([place])     Past Surgical History:  Procedure Laterality Date   ANTERIOR CERVICAL DECOMP/DISCECTOMY FUSION N/A 11/29/2017   Procedure: Cervical five-six, six-seven Anterior Cervical Decompression Fusion;  Surgeon: Judith Part, MD;  Location: [place];  Service: Neurosurgery;  Laterality: N/A;  Cervical five-six, six-seven Anterior Cervical Decompression Fusion   CATARACT EXTRACTION     IR IVC FILTER PLMT / S&I /IMG GUID/MOD SED  12/08/2017    There were no vitals filed for this visit.   Subjective Assessment - 06/24/21 1212     Subjective  Pt. reports that she was supposed to see the Dermatologist this morning, however they did not show up.    Patient is accompanied by: Family member    Pertinent History Patient s/p fall November 29, 2017 resulting in diagnosis of central cord syndrome at C 6 level.  She has had therapy in multiple venues but with recent move  to [facility], therapy staff recommended she seek outpatient therapy for her needs.    Currently in Pain? No/denies            OT TREATMENT      Therapeutic Ex.   Pt. tolerated PROM followed by AROM bilateral wrist extension, PROM for bilateral digit MP, PIP, and DIP flexion, and extension, thumb radial, and palmar abduction in conjunction with moist heat. Emphasis was placed on left wrist extension, bilateral thumb radial, and palmar abduction, and bilateral digit MP, PIP, and DIP flexion. Pt. worked on [employer], and reciprocal motion using the UBE while seated for 8 min. with min/mod resistance. Constant monitoring was provided.    Manual Therapy:   Pt. tolerated soft tissue massage to the volar, and dorsal aspect of the bilateral 2nd through 5th digits for digit PIP extensor stiffness. Manual Therapy was performed independent of, and in preparation for therapeutic Ex.    Pt. continues to present with bilateral wrist, and digit tightness.  Pt. Continues to tolerate manual therapy well. Pt. continues to tolerate the UBE without difficulty with increasing resistance, and the UBE elevated to encourage shoulder elevation. Pt. tolerated ROM well without reports of pain, or discomfort following moist heat modality. Pt. continues to present with tightness at the bilateral thumb webspace. Pt. continues to present with bilateral wrist flexor tightness,  and digit MP, PIP, and DIP extensor tightness. Pt. continues to benefit from working on impoving ROM in order to work towards increasing engagement of her bilateral hands during ADLs, and IADL tasks.                                    OT Long Term Goals - 06/01/21 1108       OT LONG TERM GOAL #1   Title Patient and caregiver will demonstrate understanding of home exercise program for ROM.    Baseline 06/01/2021: Pt. now has new restorative aides who are assiting with daily ROM at [facility] now. Pt. has recently  been in quarantine for COVID-19. Pt.'s current restorative aide is retiring at the end of December. Pt. continues to have a restorative aide rehab aide assist her wih exercises at [facility]. 8/8: Independently completes/directs HEP mulitple times/week. 12/17/2020: Pt. has a new restorative aide who will be resuming ROM program.    Time 12    Period Weeks    Status Achieved    Target Date 07/01/21      OT LONG TERM GOAL #4   Title Patient will demonstrate improved composite finger flexion to hold deodorant to apply to underarms.    Baseline 01/12/2021:  Pt. is progressing, and is able to reach to grasp, and hold narrow cones. 10/20/2020: Pt. conitnues to make steady progress with bilateral digit flexion.Pt. continues to present with stiifness/tightness which is hindering pt.'s ability to formulate a full composite fist. Pt. continues to present with improving finger flexion of R hand for a partial gross grasp, but unable to grasp and hold items. pt. conitnues to present with limited digit flexion of L hand. Pt. has active left thumb abduction. 8/8: holds cup with 2 hands, continues to have limited composite finger flexion L worse than R. 02/16/2021: pt. continues to present with limited bilateral gross composite fisting. 04/08/2021: pt. continues to present with limited digit flexion affecting her ability to formulate bilateral composite fisting. 110th visivt: Pt. is able to hold the utensils in preparation for self-feeding. 06/01/2021: continues to have difficulty formulating a fist, however has improved with self-feeding, and is now using standard utensils.    Time 12    Period Weeks    Status On-going    Target Date 07/01/21      OT LONG TERM GOAL #6   Title Patient will increase right UE ROM to comb the back of her hair with modified independence.    Baseline 01/12/2022: Pt. is able to comb her hair, reaching to the back of her hair.. 10/20/2020: Pt. continues to progress wtith RRUE ROM, and is able  to use a pic for the back of her head, however is unable to brush the back of her hair. 07/30/2020: Pt. is now able to comb the back of her hair with a pic, however continues to work on completing the task efficiently. 8/8: Achieves with great effort, has assistance for efficiancy. 02/16/2021: Pt. is now able to reach, and brush the back of her hair. 04/08/2021: pt. continues to improve reach towards the back of her hair. 110th visit: Pt. is to reach towards the back of her hair, however has difficulty sustaining her arms in elevaton for the entire duration of perfroming hair cre thoroughly. 06/01/2021: Pt. is able to reach to the back of her head, however has difficutly with thoroughness.    Time 12  Period Weeks    Status Partially Met    Target Date 07/01/21      OT LONG TERM GOAL #8   Title Pt. will write, and sign her name with 100% legibility, and modified independence.    Baseline 01/12/2021: Pt. continnues to fill out her daily menu and complete puzzles. 10/20/2020: Pt. continues to make progress overall. Pt. continues to consistently fill out her daily menu, and writing for crossword puzzles. Pt. is able to maintain grasp on a wide width pen. [place] to work on [employer]. 8/8: writes daily, continues to work on improving speed. 02/16/2021: Pt. continues to present wiht limited writing legibility, fluctuating daily. 04/08/2021: Pt. continues to to be able to fill out her daily menu.limited writing legibility for daily puzzles.110th visit: Pt. continues to present with limited legibility, and efficiency for completing daily puzzles. 06/01/2021: pt. continues to work on the consistency with legibility.    Time 12    Period Weeks    Status Partially Met    Target Date 07/01/21                   Plan - 06/24/21 1213     Clinical Impression Statement Pt. continues to present with bilateral wrist, and digit tightness.  Pt. Continues to tolerate manual therapy well. Pt. continues to  tolerate the UBE without difficulty with increasing resistance, and the UBE elevated to encourage shoulder elevation. Pt. tolerated ROM well without reports of pain, or discomfort following moist heat modality. Pt. continues to present with tightness at the bilateral thumb webspace. Pt. continues to present with bilateral wrist flexor tightness, and digit MP, PIP, and DIP extensor tightness. Pt. continues to benefit from working on impoving ROM in order to work towards increasing engagement of her bilateral hands during ADLs, and IADL tasks.      OT Occupational Profile and History Detailed Assessment- Review of Records and additional review of physical, cognitive, psychosocial history related to current functional performance    Occupational performance deficits (Please refer to evaluation for details): ADL's;IADL's;Leisure;Social Participation    Body Structure / Function / Physical Skills ADL;Continence;Dexterity;Flexibility;Strength;ROM;Balance;Coordination;FMC;IADL;Endurance;UE functional use;Decreased knowledge of use of DME;GMC    Psychosocial Skills Coping Strategies    Rehab Potential Fair    Clinical Decision Making Several treatment options, min-mod task modification necessary    Comorbidities Affecting Occupational Performance: Presence of comorbidities impacting occupational performance    Modification or Assistance to Complete Evaluation  No modification of tasks or assist necessary to complete eval    OT Frequency 2x / week    OT Duration 12 weeks    OT Treatment/Interventions Self-care/ADL training;Cryotherapy;Therapeutic exercise;DME and/or AE instruction;Balance training;Neuromuscular education;Manual Therapy;Splinting;Moist Heat;Passive range of motion;Therapeutic activities;Patient/family education    Plan continue to progress ROM of digits, wrists and shoulders as it pertains to completion of ADL tasks that pt values.    Consulted and Agree with Plan of Care Patient              Patient will benefit from skilled therapeutic intervention in order to improve the following deficits and impairments:   Body Structure / Function / Physical Skills: ADL, Continence, Dexterity, Flexibility, Strength, ROM, Balance, Coordination, FMC, IADL, Endurance, UE functional use, Decreased knowledge of use of DME, GMC   Psychosocial Skills: Coping Strategies   Visit Diagnosis: Muscle weakness (generalized)    Problem List Patient Active Problem List   Diagnosis Date Noted   Acute appendicitis with appendiceal abscess 05/30/2018   Hypocalcemia 02/15/2018  Dysuria 02/15/2018   Bilateral lower extremity edema 02/11/2018   Vaginal yeast infection 01/30/2018   Chest tightness 01/27/2018   Healthcare-associated pneumonia 01/25/2018   Pleural effusion, not elsewhere classified 01/25/2018   Acute deep vein thrombosis (DVT) of left lower extremity ([place]) 01/24/2018   Acute deep vein thrombosis (DVT) of right lower extremity ([place]) 01/24/2018   Chronic allergic rhinitis 01/24/2018   Depression with anxiety 01/24/2018   UTI due to Klebsiella species 01/24/2018   Protein-calorie malnutrition, severe ([place]) 01/24/2018   S/P insertion of IVC (inferior vena caval) filter 01/24/2018   Reactive depression    Hypokalemia    Leukocytosis    Essential hypertension    Trauma    Tetraparesis ([facility])    Neuropathic pain    Neurogenic bowel    Neurogenic bladder    Benign essential HTN    Acute blood loss anemia    Central cord syndrome at C6 level of cervical spinal cord ([place]) 11/29/2017   Central cord syndrome ([place]) 11/29/2017   Harrel Carina, MS, OTR/L   Harrel Carina, OT 06/24/2021, 12:18 PM  [place] MAIN [facility] SERVICES [address] [place], [place], [address] Phone: [phone]   Fax:  [phone]  Name: CATERIN TABARES MRN: [id number] Date of Birth: [birth year]

## 2021-06-24 NOTE — Therapy (Signed)
[place] MAIN [facility] SERVICES [address] [place], [place], [address] Phone: [phone]   Fax:  [phone]  Physical Therapy Treatment/Recertification for dates 06/24/2021-09/16/2021  Patient Details  Name: Sherry [name] MRN: [id number] Date of Birth: 01-02-1935 No data recorded  Encounter Date: 06/24/2021   PT End of Session - 06/24/21 1150     Visit Number 148    Number of Visits 172    Date for PT Re-Evaluation 09/16/21    Authorization Type [employer] medicare FOTO performed by PT on eval (7/20), score 12, Progress note on 03/10/2020; PN on 08/20/2020    Authorization Time Period Recert 05/08/4257- 5/63/8756; PN on 05/04/3293; Recert 1/88/4166- 08/01/1599; PN on 0/93/2355; Recert 73/22/0254- 2/70/6237; Recert 07/29/3149-7/61/6073    Progress Note Due on Visit 150    PT Start Time 1146    PT Stop Time 1228    PT Time Calculation (min) 42 min    Equipment Utilized During Treatment Gait belt    Activity Tolerance Patient tolerated treatment well;Patient limited by fatigue    Behavior During Therapy [facility] for tasks assessed/performed             Past Medical History:  Diagnosis Date   Acute blood loss anemia    Arthritis    Cancer ([place])    skin   Central cord syndrome at C6 level of cervical spinal cord ([place]) 11/29/2017   Hypertension    Protein-calorie malnutrition, severe ([place]) 01/24/2018   S/P insertion of IVC (inferior vena caval) filter 01/24/2018   Tetraparesis ([place])     Past Surgical History:  Procedure Laterality Date   ANTERIOR CERVICAL DECOMP/DISCECTOMY FUSION N/A 11/29/2017   Procedure: Cervical five-six, six-seven Anterior Cervical Decompression Fusion;  Surgeon: Judith Part, MD;  Location: [place];  Service: Neurosurgery;  Laterality: N/A;  Cervical five-six, six-seven Anterior Cervical Decompression Fusion   CATARACT EXTRACTION     IR IVC FILTER PLMT / S&I /IMG GUID/MOD SED  12/08/2017    There were no vitals filed  for this visit.   Subjective Assessment - 06/24/21 1148     Subjective Patient reports feeling good today and no complaints. Agreeable to reassessment visit.    Pertinent History Pt is an 87 yo female that fell in 2019, fractured vertebrae in her neck and in her low back per family. 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 After hospital stay, pt discharged to CIR for ~1 month, experienced severe UTI as well as bilateral DVT (IVC filter placed). Discharged to [facility], stayed for about a year, and then moved to [facility] in April 2021. Was receiving PT, but per family facility reported that did not have adequate equipment to maximize PT for her. Pt until about 1 month ago was practicing sit to stand transfers with 1-2 people, and working on static standing. Has a brace for L foot due to PF. Pt currently needs assistance with all ADLs (able to assist with donning/doffing her shirt), bed baths, and needs a hoyer lift for transfers. Able to drive power wheelchair without assistance.    Currently in Pain? No/denies             Reassessed goals for recertification visit:    Right LE quad strength= 23 deg from zero (improved from 35 deg from zero)   Patient was able to stand 5 trials and able to stand > 2 min for 1 of the 5 trials. 1) 1 min 40 sec 2) 2 min 38  sec 3) 38 sec (difficulty adjusting in standing and reported left LE discomfort 4) 1 min 28 sec 5) 1 min 35 sec -rest break between each trial. However- Patient did demonstrate less overall UE support and more erect posture with ability to maintain extended hips over feet as previously much more difficult to maintain.   Transfers measured last visit- Able to stand with varying ability (anywhere from min/mod/max A) - last visit able to stand pivot transfer and move right LE as previously unable.                         PT Education - 06/24/21 1149     Education Details Plan of care for recert visit     Person(s) Educated Patient    Methods Explanation;Demonstration;Tactile cues;Verbal cues    Comprehension Verbalized understanding;Returned demonstration;Verbal cues required;Tactile cues required;Need further instruction              PT Short Term Goals - 03/10/20 2211       PT SHORT TERM GOAL #1   Title The patient will perform initial HEP with minimum assistance in order to improve strength and function.    Baseline Patient demonstrating independence with initial HEP as of 03/10/2020 with no questions or difficulty.    Time 4    Period Weeks    Status Achieved    Target Date 02/11/20               PT Long Term Goals - 06/24/21 1152       PT LONG TERM GOAL #1   Title The patient will be compliant with finalized HEP with minimum assistance in preparation for self management and maintenance of condition.    Baseline 03/10/2020- Patient familiar with initial HEP and will keep goal active as HEP becomes more progressive including possible transfers and standing activities. 04/07/2020- Patient continues to report compliance with LE strengthening home exercises without questions or concerns. 04/21/2020- Patient able to verbalize and demonstrate good understanding of current HEP including some supine and seated LE strengthening exercises. Reviewed and patient performed 10 reps today with minimal cueing. Will keep goal active to progress HEP as appropriate. 05/28/2020- patient reports continues to perform LE strengthening as able and some help from caregiver as able. No changes at this time to home program as patient is limited to supine/seated therex. 07/02/2020- Patient continues to report compliance with current supine and seated LE strengtheing home program and states no questions or concerns regarding her plan.    Time 12    Period Weeks    Status Achieved      PT LONG TERM GOAL #2   Title The patient will demonstrate at least 10 point improvement on FOTO score indicating an  improved ability to perform functional activities.    Baseline on eval (/720) score 12; 10/22/19: 12; 12/10/19: 18, 12/13: 18: 04/07/2020- Will assess next visit. 07/02/2020- Will obtain next visit. 09/24/2020= Will obtain next visit. 11/12/2020= 12; 12/22/2020= 12; 03/30/2021= 17; 06/24/2021= Will assess next visit    Time 12    Period Weeks    Status On-going    Target Date 09/16/21      PT LONG TERM GOAL #3   Title The patient will demonstrate lateral scooting  for assistance with transfers with minA to maximize independence.    Baseline 10/22/19: Pt requires modA+1 for lateral scooting; 12/10/19: modA+1, 12/13: modA-maxA, 12/27: modA-maxA, 03/10/2020- ModA-MaxA- increased verbal cues and visual demonstration. 04/07/2020-  Continued Max assist to perform forward/lateral scoot. 04/21/2020- Patient was able to demo slight lateral scoot with CGA approx 12 in then fatigued requiring max assist- she is able to scoot forward by leaning without significant issues. 05/28/2020- While sitting in chair- patient able to perform forward and lateral scoot with VC/visual demo and increased time allowed- CGA today but not consistent yet- will keep goal active at this time to continue to focus on strengthening and improving technique. 07/02/2020- Patient able to demonstrate minimal ability to laterally shift weigh on mat table requiring max assist yet is able to forward scoot out to edge of mat table with min Assist. 08/20/2020 - Patient was able to demonstrate minimal lateral scooting at edge of mat while holding feet still so they did not slip on tile surface. She continues to be able to demonstrate scoot forward and uses tilt option to recline to position safely back into seat of power wheelchair. 09/25/2020- Patient able to sit on mat and minimally scoot laterally but requires min/mod A for efficient scooting. Will end goal at this time due to plateau in progress with scooting over extended time.    Time 12    Period Weeks    Status  Not Met      PT LONG TERM GOAL #4   Title The patient will demonstrate a squat pivot transfer with minimum assistance to maximize independence and mobility.    Baseline 10/22/19: unable to safely attempt at this time; 12/10/19: unable to safely attempt at this time, 01/14/20: unable to safety attempt at this time. 03/10/2020- Patient able to perform Stand pivot transfer with Maximal assist. 04/07/2020- Patient continues to require Max assist with sit to stand- will assist SPT next visit. 04/21/2020- Patient presents with Max assist to perform Sit to stand Transfer- unable to pivot or move Left LE well without difficulty. 07/02/2020= Max assist with SPT and minimal ability to pivot. 7/202/2022-Patient able to stand with minimal assist this visit (as good as CGA last visit) and able to move right foot to pivot and performing with moderate assist today. 09/24/2020- Patient able to perform sit to stand with Min assist from lift chair. Patient has demo inconsistency and will keep goal active.  12/17/2020- Goal is currently not appropriate as patient fractured her ankle and currently NWB. 12/22/2020=Goal is currently not appropriate as patient fractured her ankle and currently NWB.  03/30/2021=Patient was able to demo min assist to stand holding onto PT back of arms yet continues to require max assist to pivot as she was unable to pivot or turn her legs consistently today. 06/24/2021=Able to stand with varying ability (anywhere from min/mod/max A) - last visit able to stand pivot transfer and move right LE as previously unable.    Time 12    Period Weeks    Status On-going    Target Date 09/16/21      PT LONG TERM GOAL #5   Title The patient will demonstrate sitting without UE support for 2-5 minutes at EOB to improve participation and maximize independence with ADLs.    Baseline 10/22/19: Pt able to sit unsupported for at least 2 minutes at edge of bed. 04/21/2020- Patient continues to demo good sitting at edge of mat >  5 miin without difficulty or back support.    Time 12    Period Weeks    Status Achieved      Additional Long Term Goals   Additional Long Term Goals Yes      PT  LONG TERM GOAL #6   Title Patient will demonstrate improved functional LE strength as seen by consistent ability to stand > 2 min (3 out of 4 trials) with Mod/Max A for improved LE strength with transfers.    Baseline 05/28/2020- Patient able to inconsistent stand 1-2 min right now in //bars with Mod/Max A. 07/02/2020- Patient was able to progress last 2 visit to standing using bilateral platform attachment between 1-2 min which is a progression from the parallel bars. 08/20/2020- Patient performed static standing with min- mod A using bilateral platform attachment on walker for 6 min 30 sec today with assist for trunk control and intermittent verbal cues for posture. 09/24/2020- Patient has consistently been able to stand >2 min in // bars and when using bilateral Platform walker. 12/23/2020= Will reopen this goal as patient should transition back to weightbearing and again practice standing in new cert. 03/30/2021=1) 1 min 23 sec   2)  49mn 40 sec  3) 1 min 50 sec   Left LE hyperextended with increased difficulty tucking/extending hips today. Requiring more mod assist to remain standing. 06/24/2021=Patient was able to stand 5 trials and able to stand > 2 min for 1 of the 5 trials. 1) 1 min 40 sec 2) 2 min 38 sec 3) 38 sec (difficulty adjusting in standing and reported left LE discomfort 4) 1 min 28 sec 5) 1 min 35 sec  -rest break between each trial. However- Patient did demonstrate less overall UE support and more erect posture with ability to maintain extended hips over feet as previously much more difficult to maintain.    Time 12    Period Weeks    Status On-going    Target Date 09/15/21      PT LONG TERM GOAL #7   Title Patient/caregiver will demonstrate assisted transfer using [place] Lift consistently Independently for improved ability to  transfer at home from lift chair to bed.    Baseline 09/24/2020- Patient requires hoyer lift (Dependent) for safe transfers.  12/17/2020- Patient currently still dependent on hoyer as she is currently non-weight bearing due to recent ankle fracture. 03/30/2021- Patient stated she has not tried. Agreeable to attempt in near future. 06/24/2021= Patient continues to rely on hoyer transfers at home.    Time 12    Period Weeks    Status Deferred    Target Date 06/22/21      PT LONG TERM GOAL #8   Title Patient will demo improved right LE quad strength as seen by ability to extend knee to 20 deg from 0 or less while performing a Long arc quad (for improved terminal knee ext and more full ROM)  for improved ability to bearing weight with Right LE.    Baseline 12/22/2020=able to extend knee to approx 35 deg from zero during long arc quad; 2/27/203- WIll reassess next visit; 5/232023=23 deg from zero    Time 12    Period Weeks    Status On-going    Target Date 09/16/21      PT LONG TERM GOAL  #9   TITLE Patient will ambulate > 100 feet using LiteGait exhibiting reciprocal steps to prepare for more weighted ambulation in future.    Baseline 06/24/2021- Patient recently on 06/22/2021 ambulated approx 75 feet with litegait - but visits before only approx 35 feet.    Time 12    Period Weeks    Status New    Target Date 09/16/21  Plan - 06/24/21 1150     Clinical Impression Statement Patient present for today's reassessment visit for recert. She presents with good motivation and agreeable for testing. She presents with overall improvement with mobility. Overall less assist at times with transfers - and she was able to move her right foot with transfers as previously unable. She is able to stand more erect over past several weeks and most recently progressed to standing/walking using the unweighing system. She will benefit from continued training to try to see if she can relearn to  walk at some level and Patient's condition has the potential to improve in response to therapy. Maximum improvement is yet to be obtained. The anticipated improvement is attainable and reasonable in a generally predictable time.    Patient will benefit from skilled Physical Therapy services to assist in improving functional mobility and strength to improve patient's quality of life    Personal Factors and Comorbidities Age;Time since onset of injury/illness/exacerbation;Comorbidity 3+;Fitness    Comorbidities HTN, quadriparesis, history of DVT, neurogenic bladder    Examination-Activity Limitations Bathing;Hygiene/Grooming;Squat;Bed Mobility;Lift;Bend;Stand;Engineer, manufacturing;Toileting;Self Feeding;Transfers;Continence;Sit;Dressing;Sleep;Carry    Examination-Participation Restrictions Church;Laundry;Cleaning;Medication Management;Community Activity;Meal Prep;Interpersonal Relationship    Stability/Clinical Decision Making Evolving/Moderate complexity    Rehab Potential Fair    PT Frequency 2x / week    PT Duration 12 weeks    PT Treatment/Interventions ADLs/Self Care Home Management;Electrical Stimulation;Therapeutic activities;Wheelchair mobility training;Vasopneumatic Device;Joint Manipulations;Vestibular;Passive range of motion;Patient/family education;Therapeutic exercise;DME Instruction;Biofeedback;Aquatic Therapy;Moist Heat;Gait training;Balance training;Orthotic Fit/Training;Dry needling;Energy conservation;Taping;Splinting;Neuromuscular re-education;Cryotherapy;Ultrasound;Functional mobility training    PT Next Visit Plan Continue with progressive standing/transfer training; Progress LE Strengthening. Gait training with lite gait. Obtain latest FOTO next visit    PT Home Exercise Plan no changes    Consulted and Agree with Plan of Care Patient             Patient will benefit from skilled therapeutic intervention in order to improve the following deficits and impairments:   Abnormal gait, Decreased balance, Decreased endurance, Decreased mobility, Difficulty walking, Hypomobility, Impaired sensation, Decreased range of motion, Improper body mechanics, Impaired perceived functional ability, Decreased activity tolerance, Decreased knowledge of use of DME, Decreased safety awareness, Decreased strength, Impaired flexibility, Impaired UE functional use, Postural dysfunction  Visit Diagnosis: Muscle weakness (generalized)  Other lack of coordination  Abnormality of gait and mobility  Difficulty in walking, not elsewhere classified  Other abnormalities of gait and mobility  Unsteadiness on feet     Problem List Patient Active Problem List   Diagnosis Date Noted   Acute appendicitis with appendiceal abscess 05/30/2018   Hypocalcemia 02/15/2018   Dysuria 02/15/2018   Bilateral lower extremity edema 02/11/2018   Vaginal yeast infection 01/30/2018   Chest tightness 01/27/2018   Healthcare-associated pneumonia 01/25/2018   Pleural effusion, not elsewhere classified 01/25/2018   Acute deep vein thrombosis (DVT) of left lower extremity ([place]) 01/24/2018   Acute deep vein thrombosis (DVT) of right lower extremity ([place]) 01/24/2018   Chronic allergic rhinitis 01/24/2018   Depression with anxiety 01/24/2018   UTI due to Klebsiella species 01/24/2018   Protein-calorie malnutrition, severe ([place]) 01/24/2018   S/P insertion of IVC (inferior vena caval) filter 01/24/2018   Reactive depression    Hypokalemia    Leukocytosis    Essential hypertension    Trauma    Tetraparesis ([facility])    Neuropathic pain    Neurogenic bowel    Neurogenic bladder    Benign essential HTN    Acute blood loss anemia    Central cord syndrome  at C6 level of cervical spinal cord ([place]) 11/29/2017   Central cord syndrome [facility]) 11/29/2017    Lewis Moccasin, PT 06/25/2021, 8:45 AM  [place] MAIN [facility] SERVICES [address] [place],  [place], [address] Phone: [phone]   Fax:  [phone]  Name: Sherry [name] MRN: [id number] Date of Birth: February 14, 1934

## 2021-07-01 ENCOUNTER — Ambulatory Visit: Payer: Medicare HMO

## 2021-07-01 ENCOUNTER — Ambulatory Visit: Payer: Medicare HMO | Admitting: Occupational Therapy

## 2021-07-01 ENCOUNTER — Encounter: Payer: Self-pay | Admitting: Occupational Therapy

## 2021-07-01 DIAGNOSIS — M6281 Muscle weakness (generalized): Secondary | ICD-10-CM

## 2021-07-01 DIAGNOSIS — R2689 Other abnormalities of gait and mobility: Secondary | ICD-10-CM

## 2021-07-01 DIAGNOSIS — R278 Other lack of coordination: Secondary | ICD-10-CM

## 2021-07-01 DIAGNOSIS — R262 Difficulty in walking, not elsewhere classified: Secondary | ICD-10-CM

## 2021-07-01 DIAGNOSIS — R2681 Unsteadiness on feet: Secondary | ICD-10-CM

## 2021-07-01 DIAGNOSIS — R269 Unspecified abnormalities of gait and mobility: Secondary | ICD-10-CM

## 2021-07-01 NOTE — Therapy (Signed)
[place] [facility] SERVICES [address] [place], [place], [address] Phone: [phone]   Fax:  [phone]  Occupational Therapy Treatment  Patient Details  Name: Sherry [name] MRN: [id number] Date of Birth: 26-Aug-1934 No data recorded  Encounter Date: 07/01/2021   OT End of Session - 07/01/21 1203     Visit Number 127    Number of Visits 154    Date for OT Re-Evaluation 07/01/21    Authorization Time Period Progress report period starting 11/26/2020    OT Start Time 1100    OT Stop Time 1145    OT Time Calculation (min) 45 min    Activity Tolerance Patient tolerated treatment well    Behavior During Therapy [facility] for tasks assessed/performed             Past Medical History:  Diagnosis Date   Acute blood loss anemia    Arthritis    Cancer ([place])    skin   Central cord syndrome at C6 level of cervical spinal cord ([place]) 11/29/2017   Hypertension    Protein-calorie malnutrition, severe ([place]) 01/24/2018   S/P insertion of IVC (inferior vena caval) filter 01/24/2018   Tetraparesis ([place])     Past Surgical History:  Procedure Laterality Date   ANTERIOR CERVICAL DECOMP/DISCECTOMY FUSION N/A 11/29/2017   Procedure: Cervical five-six, six-seven Anterior Cervical Decompression Fusion;  Surgeon: Judith Part, MD;  Location: [place];  Service: Neurosurgery;  Laterality: N/A;  Cervical five-six, six-seven Anterior Cervical Decompression Fusion   CATARACT EXTRACTION     IR IVC FILTER PLMT / S&I /IMG GUID/MOD SED  12/08/2017    There were no vitals filed for this visit.   Subjective Assessment - 07/01/21 1202     Subjective  Pt. reports that  she receiverd her COVID shot in her Left shoulder    Patient is accompanied by: Family member    Pertinent History Patient s/p fall November 29, 2017 resulting in diagnosis of central cord syndrome at C 6 level.  She has had therapy in multiple venues but with recent move to [facility],  therapy staff recommended she seek outpatient therapy for her needs.    Patient Stated Goals Patient would like to be able to move better in bed, stand and perform self care tasks.    Currently in Pain? No/denies   Soreness in the left shoulder from the COVID vaccine yesterday           OT TREATMENT      Therapeutic Ex.   Pt. tolerated PROM followed by AROM bilateral wrist extension, PROM for bilateral digit MP, PIP, and DIP flexion, and extension, thumb radial, and palmar abduction in conjunction with moist heat. Emphasis was placed on left wrist extension, bilateral thumb radial, and palmar abduction, and bilateral digit MP, PIP, and DIP flexion. Pt. worked on [employer] with reaching up in multiple planes for cones with a 2# wrist weight in place. Pt. Worked on bilateral elbow flexion/extension with 2# weight in place.   Manual Therapy:   Pt. tolerated soft tissue massage to the volar, and dorsal aspect of the bilateral 2nd through 5th digits for digit PIP extensor stiffness. Manual Therapy was performed independent of, and in preparation for therapeutic Ex.    Pt. Received a COVID vaccine yesterday. Pt. Presents with soreness in the left shoulder. The UBE not performed today.  Pt. continues to present with bilateral wrist, and digit tightness.  Pt. Continues to tolerate manual  therapy well. Pt. tolerated ROM well without reports of pain, or discomfort following moist heat modality, and ROM.  Pt. continues to present with tightness at the bilateral thumb webspace. Pt. continues to present with bilateral wrist flexor tightness, and digit MP, PIP, and DIP extensor tightness. Pt. continues to benefit from working on impoving ROM in order to work towards increasing engagement of her bilateral hands during ADLs, and IADL tasks.                           OT Education - 07/01/21 1203     Education Details BUE ROM, strengthening    Person(s) Educated Patient    Methods  Explanation;Demonstration;Verbal cues    Comprehension Verbalized understanding;Need further instruction;Verbal cues required                 OT Long Term Goals - 06/01/21 1108       OT LONG TERM GOAL #1   Title Patient and caregiver will demonstrate understanding of home exercise program for ROM.    Baseline 06/01/2021: Pt. now has new restorative aides who are assiting with daily ROM at [facility] now. Pt. has recently been in quarantine for COVID-19. Pt.'s current restorative aide is retiring at the end of December. Pt. continues to have a restorative aide rehab aide assist her wih exercises at [facility]. 8/8: Independently completes/directs HEP mulitple times/week. 12/17/2020: Pt. has a new restorative aide who will be resuming ROM program.    Time 12    Period Weeks    Status Achieved    Target Date 07/01/21      OT LONG TERM GOAL #4   Title Patient will demonstrate improved composite finger flexion to hold deodorant to apply to underarms.    Baseline 01/12/2021:  Pt. is progressing, and is able to reach to grasp, and hold narrow cones. 10/20/2020: Pt. conitnues to make steady progress with bilateral digit flexion.Pt. continues to present with stiifness/tightness which is hindering pt.'s ability to formulate a full composite fist. Pt. continues to present with improving finger flexion of R hand for a partial gross grasp, but unable to grasp and hold items. pt. conitnues to present with limited digit flexion of L hand. Pt. has active left thumb abduction. 8/8: holds cup with 2 hands, continues to have limited composite finger flexion L worse than R. 02/16/2021: pt. continues to present with limited bilateral gross composite fisting. 04/08/2021: pt. continues to present with limited digit flexion affecting her ability to formulate bilateral composite fisting. 110th visivt: Pt. is able to hold the utensils in preparation for self-feeding. 06/01/2021: continues to have difficulty formulating a  fist, however has improved with self-feeding, and is now using standard utensils.    Time 12    Period Weeks    Status On-going    Target Date 07/01/21      OT LONG TERM GOAL #6   Title Patient will increase right UE ROM to comb the back of her hair with modified independence.    Baseline 01/12/2022: Pt. is able to comb her hair, reaching to the back of her hair.. 10/20/2020: Pt. continues to progress wtith RRUE ROM, and is able to use a pic for the back of her head, however is unable to brush the back of her hair. 07/30/2020: Pt. is now able to comb the back of her hair with a pic, however continues to work on completing the task efficiently. 8/8: Achieves with great  effort, has assistance for efficiancy. 02/16/2021: Pt. is now able to reach, and brush the back of her hair. 04/08/2021: pt. continues to improve reach towards the back of her hair. 110th visit: Pt. is to reach towards the back of her hair, however has difficulty sustaining her arms in elevaton for the entire duration of perfroming hair cre thoroughly. 06/01/2021: Pt. is able to reach to the back of her head, however has difficutly with thoroughness.    Time 12    Period Weeks    Status Partially Met    Target Date 07/01/21      OT LONG TERM GOAL #8   Title Pt. will write, and sign her name with 100% legibility, and modified independence.    Baseline 01/12/2021: Pt. continnues to fill out her daily menu and complete puzzles. 10/20/2020: Pt. continues to make progress overall. Pt. continues to consistently fill out her daily menu, and writing for crossword puzzles. Pt. is able to maintain grasp on a wide width pen. [place] to work on [employer]. 8/8: writes daily, continues to work on improving speed. 02/16/2021: Pt. continues to present wiht limited writing legibility, fluctuating daily. 04/08/2021: Pt. continues to to be able to fill out her daily menu.limited writing legibility for daily puzzles.110th visit: Pt. continues to present  with limited legibility, and efficiency for completing daily puzzles. 06/01/2021: pt. continues to work on the consistency with legibility.    Time 12    Period Weeks    Status Partially Met    Target Date 07/01/21               Clinical Impression:   Pt. Received a COVID vaccine yesterday. Pt. Presents with soreness in the left shoulder. The UBE not performed today.  Pt. continues to present with bilateral wrist, and digit tightness.  Pt. Continues to tolerate manual therapy well. Pt. tolerated ROM well without reports of pain, or discomfort following moist heat modality, and ROM.  Pt. continues to present with tightness at the bilateral thumb webspace. Pt. continues to present with bilateral wrist flexor tightness, and digit MP, PIP, and DIP extensor tightness. Pt. continues to benefit from working on impoving ROM in order to work towards increasing engagement of her bilateral hands during ADLs, and IADL tasks.         Plan - 07/01/21 1203     OT Occupational Profile and History Detailed Assessment- Review of Records and additional review of physical, cognitive, psychosocial history related to current functional performance    Occupational performance deficits (Please refer to evaluation for details): ADL's;IADL's;Leisure;Social Participation    Body Structure / Function / Physical Skills ADL;Continence;Dexterity;Flexibility;Strength;ROM;Balance;Coordination;FMC;IADL;Endurance;UE functional use;Decreased knowledge of use of DME;GMC    Psychosocial Skills Coping Strategies    Rehab Potential Fair    Clinical Decision Making Several treatment options, min-mod task modification necessary    Comorbidities Affecting Occupational Performance: Presence of comorbidities impacting occupational performance    Modification or Assistance to Complete Evaluation  No modification of tasks or assist necessary to complete eval    OT Frequency 2x / week    OT Duration 12 weeks    OT  Treatment/Interventions Self-care/ADL training;Cryotherapy;Therapeutic exercise;DME and/or AE instruction;Balance training;Neuromuscular education;Manual Therapy;Splinting;Moist Heat;Passive range of motion;Therapeutic activities;Patient/family education    Plan continue to progress ROM of digits, wrists and shoulders as it pertains to completion of ADL tasks that pt values.    Consulted and Agree with Plan of Care Patient  Patient will benefit from skilled therapeutic intervention in order to improve the following deficits and impairments:   Body Structure / Function / Physical Skills: ADL, Continence, Dexterity, Flexibility, Strength, ROM, Balance, Coordination, FMC, IADL, Endurance, UE functional use, Decreased knowledge of use of DME, GMC   Psychosocial Skills: Coping Strategies   Visit Diagnosis: Muscle weakness (generalized)  Other lack of coordination    Problem List Patient Active Problem List   Diagnosis Date Noted   Acute appendicitis with appendiceal abscess 05/30/2018   Hypocalcemia 02/15/2018   Dysuria 02/15/2018   Bilateral lower extremity edema 02/11/2018   Vaginal yeast infection 01/30/2018   Chest tightness 01/27/2018   Healthcare-associated pneumonia 01/25/2018   Pleural effusion, not elsewhere classified 01/25/2018   Acute deep vein thrombosis (DVT) of left lower extremity ([place]) 01/24/2018   Acute deep vein thrombosis (DVT) of right lower extremity ([place]) 01/24/2018   Chronic allergic rhinitis 01/24/2018   Depression with anxiety 01/24/2018   UTI due to Klebsiella species 01/24/2018   Protein-calorie malnutrition, severe ([place]) 01/24/2018   S/P insertion of IVC (inferior vena caval) filter 01/24/2018   Reactive depression    Hypokalemia    Leukocytosis    Essential hypertension    Trauma    Tetraparesis ([facility])    Neuropathic pain    Neurogenic bowel    Neurogenic bladder    Benign essential HTN    Acute blood loss anemia    Central cord  syndrome at C6 level of cervical spinal cord ([place]) 11/29/2017   Central cord syndrome ([place]) 11/29/2017   Harrel Carina, MS, OTR/L   Harrel Carina, OT 07/01/2021, 12:05 PM  [place] [facility] SERVICES [address] [place], [place], [address] Phone: [phone]   Fax:  [phone]  Name: REGINAE WOLFREY MRN: [id number] Date of Birth: 12-28-1934

## 2021-07-01 NOTE — Therapy (Signed)
[place] MAIN [facility] SERVICES [address] [place], [place], [address] Phone: [phone]   Fax:  [phone]  Physical Therapy Treatment  Patient Details  Name: Sherry [name] MRN: [id number] Date of Birth: [birth year] No data recorded  Encounter Date: 07/01/2021   PT End of Session - 07/01/21 1801     Visit Number 149    Number of Visits 172    Date for PT Re-Evaluation 09/16/21    Authorization Type [employer]are FOTO performed by PT on eval (7/20), score 12, Progress note on 03/10/2020; PN on 08/20/2020    Authorization Time Period Recert 02/06/3792- 04/27/6145; PN on 0/10/2955; Recert 4/73/4037- 09/64/3838; PN on 1/84/0375; Recert 43/60/6770- 3/40/3524; Recert 09/19/5907-04/11/2160    Progress Note Due on Visit 150    PT Start Time 1145    PT Stop Time 1220    PT Time Calculation (min) 35 min    Equipment Utilized During Treatment Gait belt    Activity Tolerance Patient tolerated treatment well;Patient limited by fatigue    Behavior During Therapy WFL for tasks assessed/performed             Past Medical History:  Diagnosis Date   Acute blood loss anemia    Arthritis    Cancer ([place])    skin   Central cord syndrome at C6 level of cervical spinal cord ([place]) 11/29/2017   Hypertension    Protein-calorie malnutrition, severe ([place]) 01/24/2018   S/P insertion of IVC (inferior vena caval) filter 01/24/2018   Tetraparesis ([place])     Past Surgical History:  Procedure Laterality Date   ANTERIOR CERVICAL DECOMP/DISCECTOMY FUSION N/A 11/29/2017   Procedure: Cervical five-six, six-seven Anterior Cervical Decompression Fusion;  Surgeon: Judith Part, MD;  Location: [facility];  Service: Neurosurgery;  Laterality: N/A;  Cervical five-six, six-seven Anterior Cervical Decompression Fusion   CATARACT EXTRACTION     IR IVC FILTER PLMT / S&I /IMG GUID/MOD SED  12/08/2017    There were no vitals filed for this visit.   Subjective Assessment -  07/01/21 1800     Subjective Patient reports she received her COVID vaccine yesterday and mildy sore    Pertinent History Pt is an 87 yo female that fell in 2019, fractured vertebrae in her neck and in her low back per family. 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 After hospital stay, pt discharged to [facility] for ~1 month, experienced severe UTI as well as bilateral DVT (IVC filter placed). Discharged to [facility], stayed for about a year, and then moved to [facility] in April 2021. Was receiving PT, but per family facility reported that did not have adequate equipment to maximize PT for her. Pt until about 1 month ago was practicing sit to stand transfers with 1-2 people, and working on static standing. Has a brace for L foot due to PF. Pt currently needs assistance with all ADLs (able to assist with donning/doffing her shirt), bed baths, and needs a hoyer lift for transfers. Able to drive power wheelchair without assistance.    Currently in Pain? No/denies              INTERVENTIONS:    Therapeutic Activities:    Patient performed stand pivot transfer from power w/c to mat table- Mod-  assist to stand and pivot today over to mat able. Patient was able to mover her right foot again to facilitate transfer with VC only and pivot without PT assist with right LE.  Patient was able to roll left and right with min assist to don the harness of litegait and then able to sit back up at edge of bed with mod assist with LE and min assist with upper trunk.      Gait training:  Using Litegait unweighing system  Patient was able to stand with use of lift and centered hips over feet prior to ambulation. Patient struggled more today - more difficulty advancing each leg forward and difficulty weightbearing through right LE requiring more unweighing than previous sessions. She  ambulated in clinic approx 45 feet - VC for step length and mild difficulty with foot placement and right LE  advancement at times. Paitent was limited more by fatigue and decline any exercises after walking stating she was too fatigued.    Education provided throughout session via VC/TC and demonstration to facilitate movement at target joints and correct muscle activation for all testing and exercises performed.                              PT Education - 07/01/21 1800     Education Details gait safety cues    Person(s) Educated Patient    Methods Explanation;Demonstration;Tactile cues;Verbal cues    Comprehension Verbalized understanding;Returned demonstration;Verbal cues required;Tactile cues required;Need further instruction              PT Short Term Goals - 03/10/20 2211       PT SHORT TERM GOAL #1   Title The patient will perform initial HEP with minimum assistance in order to improve strength and function.    Baseline Patient demonstrating independence with initial HEP as of 03/10/2020 with no questions or difficulty.    Time 4    Period Weeks    Status Achieved    Target Date 02/11/20               PT Long Term Goals - 06/24/21 1152       PT LONG TERM GOAL #1   Title The patient will be compliant with finalized HEP with minimum assistance in preparation for self management and maintenance of condition.    Baseline 03/10/2020- Patient familiar with initial HEP and will keep goal active as HEP becomes more progressive including possible transfers and standing activities. 04/07/2020- Patient continues to report compliance with LE strengthening home exercises without questions or concerns. 04/21/2020- Patient able to verbalize and demonstrate good understanding of current HEP including some supine and seated LE strengthening exercises. Reviewed and patient performed 10 reps today with minimal cueing. Will keep goal active to progress HEP as appropriate. 05/28/2020- patient reports continues to perform LE strengthening as able and some help from caregiver as  able. No changes at this time to home program as patient is limited to supine/seated therex. 07/02/2020- Patient continues to report compliance with current supine and seated LE strengtheing home program and states no questions or concerns regarding her plan.    Time 12    Period Weeks    Status Achieved      PT LONG TERM GOAL #2   Title The patient will demonstrate at least 10 point improvement on FOTO score indicating an improved ability to perform functional activities.    Baseline on eval (/720) score 12; 10/22/19: 12; 12/10/19: 18, 12/13: 18: 04/07/2020- Will assess next visit. 07/02/2020- Will obtain next visit. 09/24/2020= Will obtain next visit. 11/12/2020= 12; 12/22/2020= 12; 03/30/2021= 17; 06/24/2021= Will assess next  visit    Time 12    Period Weeks    Status On-going    Target Date 09/16/21      PT LONG TERM GOAL #3   Title The patient will demonstrate lateral scooting  for assistance with transfers with minA to maximize independence.    Baseline 10/22/19: Pt requires modA+1 for lateral scooting; 12/10/19: modA+1, 12/13: modA-maxA, 12/27: modA-maxA, 03/10/2020- ModA-MaxA- increased verbal cues and visual demonstration. 04/07/2020- Continued Max assist to perform forward/lateral scoot. 04/21/2020- Patient was able to demo slight lateral scoot with CGA approx 12 in then fatigued requiring max assist- she is able to scoot forward by leaning without significant issues. 05/28/2020- While sitting in chair- patient able to perform forward and lateral scoot with VC/visual demo and increased time allowed- CGA today but not consistent yet- will keep goal active at this time to continue to focus on strengthening and improving technique. 07/02/2020- Patient able to demonstrate minimal ability to laterally shift weigh on mat table requiring max assist yet is able to forward scoot out to edge of mat table with min Assist. 08/20/2020 - Patient was able to demonstrate minimal lateral scooting at edge of mat while holding  feet still so they did not slip on tile surface. She continues to be able to demonstrate scoot forward and uses tilt option to recline to position safely back into seat of power wheelchair. 09/25/2020- Patient able to sit on mat and minimally scoot laterally but requires min/mod A for efficient scooting. Will end goal at this time due to plateau in progress with scooting over extended time.    Time 12    Period Weeks    Status Not Met      PT LONG TERM GOAL #4   Title The patient will demonstrate a squat pivot transfer with minimum assistance to maximize independence and mobility.    Baseline 10/22/19: unable to safely attempt at this time; 12/10/19: unable to safely attempt at this time, 01/14/20: unable to safety attempt at this time. 03/10/2020- Patient able to perform Stand pivot transfer with Maximal assist. 04/07/2020- Patient continues to require Max assist with sit to stand- will assist SPT next visit. 04/21/2020- Patient presents with Max assist to perform Sit to stand Transfer- unable to pivot or move Left LE well without difficulty. 07/02/2020= Max assist with SPT and minimal ability to pivot. 7/202/2022-Patient able to stand with minimal assist this visit (as good as CGA last visit) and able to move right foot to pivot and performing with moderate assist today. 09/24/2020- Patient able to perform sit to stand with Min assist from lift chair. Patient has demo inconsistency and will keep goal active.  12/17/2020- Goal is currently not appropriate as patient fractured her ankle and currently NWB. 12/22/2020=Goal is currently not appropriate as patient fractured her ankle and currently NWB.  03/30/2021=Patient was able to demo min assist to stand holding onto PT back of arms yet continues to require max assist to pivot as she was unable to pivot or turn her legs consistently today. 06/24/2021=Able to stand with varying ability (anywhere from min/mod/max A) - last visit able to stand pivot transfer and move right  LE as previously unable.    Time 12    Period Weeks    Status On-going    Target Date 09/16/21      PT LONG TERM GOAL #5   Title The patient will demonstrate sitting without UE support for 2-5 minutes at EOB to improve participation and maximize independence  with ADLs.    Baseline 10/22/19: Pt able to sit unsupported for at least 2 minutes at edge of bed. 04/21/2020- Patient continues to demo good sitting at edge of mat > 5 miin without difficulty or back support.    Time 12    Period Weeks    Status Achieved      Additional Long Term Goals   Additional Long Term Goals Yes      PT LONG TERM GOAL #6   Title Patient will demonstrate improved functional LE strength as seen by consistent ability to stand > 2 min (3 out of 4 trials) with Mod/Max A for improved LE strength with transfers.    Baseline 05/28/2020- Patient able to inconsistent stand 1-2 min right now in //bars with Mod/Max A. 07/02/2020- Patient was able to progress last 2 visit to standing using bilateral platform attachment between 1-2 min which is a progression from the parallel bars. 08/20/2020- Patient performed static standing with min- mod A using bilateral platform attachment on walker for 6 min 30 sec today with assist for trunk control and intermittent verbal cues for posture. 09/24/2020- Patient has consistently been able to stand >2 min in // bars and when using bilateral Platform walker. 12/23/2020= Will reopen this goal as patient should transition back to weightbearing and again practice standing in new cert. 03/30/2021=1) 1 min 23 sec   2)  42mn 40 sec  3) 1 min 50 sec   Left LE hyperextended with increased difficulty tucking/extending hips today. Requiring more mod assist to remain standing. 06/24/2021=Patient was able to stand 5 trials and able to stand > 2 min for 1 of the 5 trials. 1) 1 min 40 sec 2) 2 min 38 sec 3) 38 sec (difficulty adjusting in standing and reported left LE discomfort 4) 1 min 28 sec 5) 1 min 35 sec  -rest  break between each trial. However- Patient did demonstrate less overall UE support and more erect posture with ability to maintain extended hips over feet as previously much more difficult to maintain.    Time 12    Period Weeks    Status On-going    Target Date 09/15/21      PT LONG TERM GOAL #7   Title Patient/caregiver will demonstrate assisted transfer using [place] Lift consistently Independently for improved ability to transfer at home from lift chair to bed.    Baseline 09/24/2020- Patient requires hoyer lift (Dependent) for safe transfers.  12/17/2020- Patient currently still dependent on hoyer as she is currently non-weight bearing due to recent ankle fracture. 03/30/2021- Patient stated she has not tried. Agreeable to attempt in near future. 06/24/2021= Patient continues to rely on hoyer transfers at home.    Time 12    Period Weeks    Status Deferred    Target Date 06/22/21      PT LONG TERM GOAL #8   Title Patient will demo improved right LE quad strength as seen by ability to extend knee to 20 deg from 0 or less while performing a Long arc quad (for improved terminal knee ext and more full ROM)  for improved ability to bearing weight with Right LE.    Baseline 12/22/2020=able to extend knee to approx 35 deg from zero during long arc quad; 2/27/203- WIll reassess next visit; 5/232023=23 deg from zero    Time 12    Period Weeks    Status On-going    Target Date 09/16/21      PT LONG TERM  GOAL  #9   TITLE Patient will ambulate > 100 feet using LiteGait exhibiting reciprocal steps to prepare for more weighted ambulation in future.    Baseline 06/24/2021- Patient recently on 06/22/2021 ambulated approx 75 feet with litegait - but visits before only approx 35 feet.    Time 12    Period Weeks    Status New    Target Date 09/16/21                   Plan - 07/01/21 1801     Clinical Impression Statement Patient presents today with good motivation today and presents with good  participation with transfers and gait activities. She is now for 2 visits in a row able to more right LE better with SPT. However during ambulation was  more limited by weakness and fatigue. Patient will benefit from skilled Physical Therapy services to assist in improving functional mobility and strength to improve patient's quality of life    Personal Factors and Comorbidities Age;Time since onset of injury/illness/exacerbation;Comorbidity 3+;Fitness    Comorbidities HTN, quadriparesis, history of DVT, neurogenic bladder    Examination-Activity Limitations Bathing;Hygiene/Grooming;Squat;Bed Mobility;Lift;Bend;Stand;Engineer, manufacturing;Toileting;Self Feeding;Transfers;Continence;Sit;Dressing;Sleep;Carry    Examination-Participation Restrictions Church;Laundry;Cleaning;Medication Management;Community Activity;Meal Prep;Interpersonal Relationship    Stability/Clinical Decision Making Evolving/Moderate complexity    Rehab Potential Fair    PT Frequency 2x / week    PT Duration 12 weeks    PT Treatment/Interventions ADLs/Self Care Home Management;Electrical Stimulation;Therapeutic activities;Wheelchair mobility training;Vasopneumatic Device;Joint Manipulations;Vestibular;Passive range of motion;Patient/family education;Therapeutic exercise;DME Instruction;Biofeedback;Aquatic Therapy;Moist Heat;Gait training;Balance training;Orthotic Fit/Training;Dry needling;Energy conservation;Taping;Splinting;Neuromuscular re-education;Cryotherapy;Ultrasound;Functional mobility training    PT Next Visit Plan Continue with progressive standing/transfer training; Progress LE Strengthening. Gait training with lite gait. Obtain latest FOTO next visit    PT Home Exercise Plan no changes    Consulted and Agree with Plan of Care Patient             Patient will benefit from skilled therapeutic intervention in order to improve the following deficits and impairments:  Abnormal gait, Decreased balance, Decreased  endurance, Decreased mobility, Difficulty walking, Hypomobility, Impaired sensation, Decreased range of motion, Improper body mechanics, Impaired perceived functional ability, Decreased activity tolerance, Decreased knowledge of use of DME, Decreased safety awareness, Decreased strength, Impaired flexibility, Impaired UE functional use, Postural dysfunction  Visit Diagnosis: Muscle weakness (generalized)  Other lack of coordination  Abnormality of gait and mobility  Difficulty in walking, not elsewhere classified  Other abnormalities of gait and mobility  Unsteadiness on feet     Problem List Patient Active Problem List   Diagnosis Date Noted   Acute appendicitis with appendiceal abscess 05/30/2018   Hypocalcemia 02/15/2018   Dysuria 02/15/2018   Bilateral lower extremity edema 02/11/2018   Vaginal yeast infection 01/30/2018   Chest tightness 01/27/2018   Healthcare-associated pneumonia 01/25/2018   Pleural effusion, not elsewhere classified 01/25/2018   Acute deep vein thrombosis (DVT) of left lower extremity ([place]) 01/24/2018   Acute deep vein thrombosis (DVT) of right lower extremity ([place]) 01/24/2018   Chronic allergic rhinitis 01/24/2018   Depression with anxiety 01/24/2018   UTI due to Klebsiella species 01/24/2018   Protein-calorie malnutrition, severe ([place]) 01/24/2018   S/P insertion of IVC (inferior vena caval) filter 01/24/2018   Reactive depression    Hypokalemia    Leukocytosis    Essential hypertension    Trauma    Tetraparesis ([facility])    Neuropathic pain    Neurogenic bowel    Neurogenic bladder    Benign essential HTN  Acute blood loss anemia    Central cord syndrome at C6 level of cervical spinal cord ([facility]) 11/29/2017   Central cord syndrome [facility]) 11/29/2017    Lewis Moccasin, PT 07/01/2021, 6:09 PM  [place] MAIN [facility] SERVICES [address] [place], [place], [address] Phone: [phone]   Fax:   [phone]  Name: Sherry [name] MRN: [id number] Date of Birth: [birth year]

## 2021-07-06 ENCOUNTER — Ambulatory Visit: Payer: Medicare HMO

## 2021-07-06 ENCOUNTER — Ambulatory Visit: Payer: Medicare HMO | Attending: Internal Medicine | Admitting: Occupational Therapy

## 2021-07-06 ENCOUNTER — Encounter: Payer: Self-pay | Admitting: Occupational Therapy

## 2021-07-06 DIAGNOSIS — R278 Other lack of coordination: Secondary | ICD-10-CM

## 2021-07-06 DIAGNOSIS — S14129S Central cord syndrome at unspecified level of cervical spinal cord, sequela: Secondary | ICD-10-CM | POA: Insufficient documentation

## 2021-07-06 DIAGNOSIS — R2689 Other abnormalities of gait and mobility: Secondary | ICD-10-CM | POA: Insufficient documentation

## 2021-07-06 DIAGNOSIS — R262 Difficulty in walking, not elsewhere classified: Secondary | ICD-10-CM | POA: Insufficient documentation

## 2021-07-06 DIAGNOSIS — M6281 Muscle weakness (generalized): Secondary | ICD-10-CM | POA: Diagnosis present

## 2021-07-06 DIAGNOSIS — R269 Unspecified abnormalities of gait and mobility: Secondary | ICD-10-CM

## 2021-07-06 DIAGNOSIS — R2681 Unsteadiness on feet: Secondary | ICD-10-CM

## 2021-07-06 NOTE — Therapy (Signed)
[place] MAIN [facility] SERVICES [address] [place], [place], [address] Phone: [phone]   Fax:  [phone]  OT Treatment/Recertication Note  Patient Details  Name: Sherry [name] MRN: [id number] Date of Birth: 12/26/1934 No data recorded  Encounter Date: 07/06/2021   OT End of Session - 07/06/21 1104     Visit Number 128    Number of Visits 154    Date for OT Re-Evaluation 09/28/21    Authorization Time Period Progress report period starting 06/01/2021   OT Start Time 79    OT Stop Time 1145    OT Time Calculation (min) 45 min    Equipment Utilized During Treatment moist heat    Activity Tolerance Patient tolerated treatment well    Behavior During Therapy [facility] for tasks assessed/performed             Past Medical History:  Diagnosis Date   Acute blood loss anemia    Arthritis    Cancer ([place])    skin   Central cord syndrome at C6 level of cervical spinal cord ([place]) 11/29/2017   Hypertension    Protein-calorie malnutrition, severe ([place]) 01/24/2018   S/P insertion of IVC (inferior vena caval) filter 01/24/2018   Tetraparesis ([place])     Past Surgical History:  Procedure Laterality Date   ANTERIOR CERVICAL DECOMP/DISCECTOMY FUSION N/A 11/29/2017   Procedure: Cervical five-six, six-seven Anterior Cervical Decompression Fusion;  Surgeon: Judith Part, MD;  Location: [facility];  Service: Neurosurgery;  Laterality: N/A;  Cervical five-six, six-seven Anterior Cervical Decompression Fusion   CATARACT EXTRACTION     IR IVC FILTER PLMT / S&I /IMG GUID/MOD SED  12/08/2017    There were no vitals filed for this visit.   Subjective Assessment - 07/06/21 1559     Subjective  Pt. reports that her shoulder feels better after receiving a COVID shot in her Left shoulder    Patient is accompanied by: Family member    Pertinent History Patient s/p fall November 29, 2017 resulting in diagnosis of central cord syndrome at C 6 level.  She  has had therapy in multiple venues but with recent move to [facility], therapy staff recommended she seek outpatient therapy for her needs.    Patient Stated Goals Patient would like to be able to move better in bed, stand and perform self care tasks.    Currently in Pain? No/denies                [facility] OT Assessment - 07/06/21 1115       AROM   Right Shoulder Flexion 124 Degrees    Right Shoulder ABduction 99 Degrees    Left Shoulder Flexion 140 Degrees    Left Shoulder ABduction 120 Degrees    Right Elbow Flexion 155    Right Elbow Extension 0    Left Elbow Flexion 155    Left Elbow Extension 0    Right Wrist Extension 35 Degrees    Right Wrist Flexion 45 Degrees    Right Wrist Radial Deviation 12 Degrees    Right Wrist Ulnar Deviation 15 Degrees    Left Wrist Extension -10 Degrees    Left Wrist Flexion 85 Degrees    Left Wrist Radial Deviation 20 Degrees    Left Wrist Ulnar Deviation 15 Degrees    Digit flexion to the [facility] in centimeters:  R: 2nd: 4(3.5), 3rd: 5(5), 4th: 6(3.5), 5th: 5(5)  L: 2nd: 8(7.7), 3rd: 8(7), 4th: 8(6.5), 5th 7(6)       Pt. is making progress with BUE ROM, and functional reaching. Pt. is now able to use her bilateral UE's to reach higher for ADL, and IADL items, and for performing UE dressing. Pt. has improved with ROM, and is able to reach to the back of her hair, however has difficulty sustaining UE functional reach to thoroughly use a comb/pic at the back of her head. Pt. Continues to present with ROM limitations in the left wrist extensors, and bilateral digit flexors. Pt. Continues to work towards improving bilateral wrist, and digit ROM in order to work towards being able to grasp, hold, and use common ADL, and IADL items efficiently.                   OT Education - 07/06/21 1104     Education Details BUE ROM, strengthening    Person(s) Educated Patient    Methods  Explanation;Demonstration;Verbal cues    Comprehension Verbalized understanding;Need further instruction;Verbal cues required                 OT Long Term Goals - 07/06/21 1106       OT LONG TERM GOAL #1   Title Patient and caregiver will demonstrate understanding of home exercise program for ROM.    Baseline 06/01/2021: Pt. now has new restorative aides who are assiting with daily ROM at [facility] now. Pt. has recently been in quarantine for COVID-19. Pt.'s current restorative aide is retiring at the end of December. Pt. continues to have a restorative aide rehab aide assist her wih exercises at [facility]. 8/8: Independently completes/directs HEP mulitple times/week. 12/17/2020: Pt. has a new restorative aide who will be resuming ROM program.    Time 12    Period Weeks    Status Achieved      OT LONG TERM GOAL #2   Title Patient will demonstrate ability to don shirt with min assist from seated position.    Baseline Pt. is indepedent with a larger shirt, and continues to require increased time to complete,    Time 12    Period Weeks    Status Achieved      OT LONG TERM GOAL #4   Title Patient will demonstrate improved composite finger flexion to hold deodorant to apply to underarms.    Baseline 01/12/2021:  Pt. is progressing, and is able to reach to grasp, and hold narrow cones. 10/20/2020: Pt. conitnues to make steady progress with bilateral digit flexion.Pt. continues to present with stiifness/tightness which is hindering pt.'s ability to formulate a full composite fist. Pt. continues to present with improving finger flexion of R hand for a partial gross grasp, but unable to grasp and hold items. pt. conitnues to present with limited digit flexion of L hand. Pt. has active left thumb abduction. 8/8: holds cup with 2 hands, continues to have limited composite finger flexion L worse than R. 02/16/2021: pt. continues to present with limited bilateral gross composite fisting. 04/08/2021:  pt. continues to present with limited digit flexion affecting her ability to formulate bilateral composite fisting. 110th visit: Pt. is able to hold the utensils in preparation for self-feeding. 06/01/2021: continues to have difficulty formulating a fist, however has improved with self-feeding, and is now using standard utensils.07/06/2021: Pt. continues to have difficulty holding deoderant, and using deodorant, however pt. is improving with holding similiar sized objects.    Time 12    Period  Weeks    Status On-going    Target Date 09/28/21      Long Term Additional Goals   Additional Long Term Goals Yes      OT LONG TERM GOAL #6   Title Patient will increase right UE ROM to comb the back of her hair with modified independence.    Baseline 01/12/2022: Pt. is able to comb her hair, reaching to the back of her hair.. 10/20/2020: Pt. continues to progress wtith RRUE ROM, and is able to use a pic for the back of her head, however is unable to brush the back of her hair. 07/30/2020: Pt. is now able to comb the back of her hair with a pic, however continues to work on completing the task efficiently. 8/8: Achieves with great effort, has assistance for efficiancy. 02/16/2021: Pt. is now able to reach, and brush the back of her hair. 04/08/2021: pt. continues to improve reach towards the back of her hair. 110th visit: Pt. is to reach towards the back of her hair, however has difficulty sustaining her arms in elevaton for the entire duration of perfroming hair cre thoroughly. 06/01/2021: Pt. is able to reach to the back of her head, however has difficutly with thoroughness. 07/06/2021: Pt. continues to improve with reaching to the back of her hair, however has difficulty with thoroughly performing the task in the back of her hair.    Time 12    Period Weeks    Status Partially Met    Target Date 09/28/21      OT LONG TERM GOAL #8   Title Pt. will write, and sign her name with 100% legibility, and modified  independence.    Baseline 01/12/2021: Pt. continues to fill out her daily menu and complete puzzles. 10/20/2020: Pt. continues to make progress overall. Pt. continues to consistently fill out her daily menu, and writing for crossword puzzles. Pt. is able to maintain grasp on a wide width pen. Contineus to work on Media planner. 8/8: writes daily, continues to work on improving speed. 02/16/2021: Pt. continues to present wiht limited writing legibility, fluctuating daily. 04/08/2021: Pt. continues to to be able to fill out her daily menu.limited writing legibility for daily puzzles.110th visit: Pt. continues to present with limited legibility, and efficiency for completing daily puzzles. 06/01/2021: pt. continues to work on the consistency with legibility 07/06/2021: Pt. continues to work on improving consistency with legibility.    Time 12    Period Weeks    Status On-going    Target Date 09/28/21      OT LONG TERM GOAL  #9   TITLE Pt. will turn pages in a book with modified independence.    Baseline 07/06/2021: Pt. has difficulty turning pages.    Time 12    Period Weeks    Status New    Target Date 07/06/21                   Plan - 07/06/21 1104     Clinical Impression Statement Pt. is making progress with BUE ROM, and functional reaching. Pt. is now able to use her bilateral UE's to reach higher for ADL, and IADL items, and for performing UE dressing. Pt. has improved with ROM, and is able to reach to the back of her hair, however has difficulty sustaining UE functional reach to thoroughly use a comb/pic at the back of her head. Pt. Continues to present with ROM limitations in the left wrist extensors, and bilateral digit  flexors. Pt. Continues to work towards improving bilateral wrist, and digit ROM in order to work towards being able to grasp, hold, and use common ADL, and IADL items efficiently.       OT Occupational Profile and History Detailed Assessment- Review of Records and  additional review of physical, cognitive, psychosocial history related to current functional performance    Occupational performance deficits (Please refer to evaluation for details): ADL's;IADL's;Leisure;Social Participation    Body Structure / Function / Physical Skills ADL;Continence;Dexterity;Flexibility;Strength;ROM;Balance;Coordination;FMC;IADL;Endurance;UE functional use;Decreased knowledge of use of DME;GMC    Psychosocial Skills Coping Strategies    Rehab Potential Fair    Clinical Decision Making Several treatment options, min-mod task modification necessary    Comorbidities Affecting Occupational Performance: Presence of comorbidities impacting occupational performance    Comorbidities impacting occupational performance description: contractures of bilateral hands, dependent transfers,    OT Duration OT Frequency 12 weeks  2x's week   OT Treatment/Interventions Self-care/ADL training;Cryotherapy;Therapeutic exercise;DME and/or AE instruction;Balance training;Neuromuscular education;Manual Therapy;Splinting;Moist Heat;Passive range of motion;Therapeutic activities;Patient/family education    Plan continue to progress ROM of digits, wrists and shoulders as it pertains to completion of ADL tasks that pt values.    Consulted and Agree with Plan of Care Patient             Patient will benefit from skilled therapeutic intervention in order to improve the following deficits and impairments:   Body Structure / Function / Physical Skills: ADL, Continence, Dexterity, Flexibility, Strength, ROM, Balance, Coordination, FMC, IADL, Endurance, UE functional use, Decreased knowledge of use of DME, GMC   Psychosocial Skills: Coping Strategies   Visit Diagnosis: Muscle weakness (generalized)    Problem List Patient Active Problem List   Diagnosis Date Noted   Acute appendicitis with appendiceal abscess 05/30/2018   Hypocalcemia 02/15/2018   Dysuria 02/15/2018   Bilateral lower  extremity edema 02/11/2018   Vaginal yeast infection 01/30/2018   Chest tightness 01/27/2018   Healthcare-associated pneumonia 01/25/2018   Pleural effusion, not elsewhere classified 01/25/2018   Acute deep vein thrombosis (DVT) of left lower extremity ([place]) 01/24/2018   Acute deep vein thrombosis (DVT) of right lower extremity ([place]) 01/24/2018   Chronic allergic rhinitis 01/24/2018   Depression with anxiety 01/24/2018   UTI due to Klebsiella species 01/24/2018   Protein-calorie malnutrition, severe ([place]) 01/24/2018   S/P insertion of IVC (inferior vena caval) filter 01/24/2018   Reactive depression    Hypokalemia    Leukocytosis    Essential hypertension    Trauma    Tetraparesis ([facility])    Neuropathic pain    Neurogenic bowel    Neurogenic bladder    Benign essential HTN    Acute blood loss anemia    Central cord syndrome at C6 level of cervical spinal cord ([place]) 11/29/2017   Central cord syndrome ([place]) 11/29/2017   Sherry Carina, MS, OTR/L   Sherry [name], OT 07/06/2021, 4:40 PM  [place] MAIN [facility] SERVICES [address] [place], [place], [address] Phone: [phone]   Fax:  [phone]  Name: Sherry [name] MRN: [id number] Date of Birth: March 09, 1934

## 2021-07-06 NOTE — Therapy (Addendum)
[place] MAIN [facility] SERVICES [address] [place], [place], [address] Phone: [phone]   Fax:  [phone]  Physical Therapy Treatment/Physical Therapy Progress Note   Dates of reporting period  05/18/2021   to  07/06/2021  Patient Details  Name: Sherry [name] MRN: [id number] Date of Birth: March 27, 1934 No data recorded  Encounter Date: 07/06/2021   PT End of Session - 07/06/21 1301     Visit Number 150    Number of Visits 172    Date for PT Re-Evaluation 09/16/21    Authorization Type [employer] medicare FOTO performed by PT on eval (7/20), score 12, Progress note on 03/10/2020; PN on 08/20/2020    Authorization Time Period Recert 07/09/1243- 09/10/9831; PN on 09/03/5051; Recert 9/76/7341- 93/79/0240; PN on 9/73/5329; Recert 92/42/6834- 1/96/2229; Recert 7/98/9211-9/41/7408    Progress Note Due on Visit 160    PT Start Time 1150    PT Stop Time 1220    PT Time Calculation (min) 30 min    Equipment Utilized During Treatment Gait belt    Activity Tolerance Patient tolerated treatment well;Patient limited by fatigue    Behavior During Therapy [facility] for tasks assessed/performed             Past Medical History:  Diagnosis Date   Acute blood loss anemia    Arthritis    Cancer ([place])    skin   Central cord syndrome at C6 level of cervical spinal cord ([place]) 11/29/2017   Hypertension    Protein-calorie malnutrition, severe ([place]) 01/24/2018   S/P insertion of IVC (inferior vena caval) filter 01/24/2018   Tetraparesis ([place])     Past Surgical History:  Procedure Laterality Date   ANTERIOR CERVICAL DECOMP/DISCECTOMY FUSION N/A 11/29/2017   Procedure: Cervical five-six, six-seven Anterior Cervical Decompression Fusion;  Surgeon: Judith Part, MD;  Location: [place];  Service: Neurosurgery;  Laterality: N/A;  Cervical five-six, six-seven Anterior Cervical Decompression Fusion   CATARACT EXTRACTION     IR IVC FILTER PLMT / S&I /IMG GUID/MOD SED   12/08/2017    There were no vitals filed for this visit.   Subjective Assessment - 07/06/21 1300     Subjective Patient reports no changes since last visit. Reports doing okay without pain.    Pertinent History Pt is an 87 yo female that fell in 2019, fractured vertebrae in her neck and in her low back per family. 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 After hospital stay, pt discharged to [facility] for ~1 month, experienced severe UTI as well as bilateral DVT (IVC filter placed). Discharged to [facility], stayed for about a year, and then moved to [facility] in April 2021. Was receiving PT, but per family facility reported that did not have adequate equipment to maximize PT for her. Pt until about 1 month ago was practicing sit to stand transfers with 1-2 people, and working on static standing. Has a brace for L foot due to PF. Pt currently needs assistance with all ADLs (able to assist with donning/doffing her shirt), bed baths, and needs a hoyer lift for transfers. Able to drive power wheelchair without assistance.    Currently in Pain? No/denies                      INTERVENTIONS:      Therapeutic Activities:    Patient performed stand pivot transfer from power w/c to mat table- Mod-  assist to stand and pivot today over  to mat able. Patient again - able to advance her right foot during pivot transfer with VC only   Patient was able to roll left and right with min assist of PT and caregiver  to don the harness of litegait and then able to sit back up at edge of bed with mod assist with LE and min assist with upper trunk.      Gait training:  Using Litegait unweighing system  Patient was able to stand with use of lift and centered hips over feet prior to ambulation. Patient ambulated approx 50 feet- exhibiting increased difficulty advancing right LE - tactile cues and physical assist to shift hips from side to side (Unweighing system - measured at height of 18  in from base for future reference)    Education provided throughout session via VC/TC and demonstration to facilitate movement at target joints and correct muscle activation for all testing and exercises performed.                                 PT Education - 07/06/21 1301     Education Details Transfer and gait cues    Person(s) Educated Patient    Methods Explanation;Demonstration;Tactile cues;Verbal cues    Comprehension Verbalized understanding;Returned demonstration;Verbal cues required;Tactile cues required;Need further instruction              PT Short Term Goals - 03/10/20 2211       PT SHORT TERM GOAL #1   Title The patient will perform initial HEP with minimum assistance in order to improve strength and function.    Baseline Patient demonstrating independence with initial HEP as of 03/10/2020 with no questions or difficulty.    Time 4    Period Weeks    Status Achieved    Target Date 02/11/20               PT Long Term Goals - 07/06/21 1510       PT LONG TERM GOAL #1   Title The patient will be compliant with finalized HEP with minimum assistance in preparation for self management and maintenance of condition.    Baseline 03/10/2020- Patient familiar with initial HEP and will keep goal active as HEP becomes more progressive including possible transfers and standing activities. 04/07/2020- Patient continues to report compliance with LE strengthening home exercises without questions or concerns. 04/21/2020- Patient able to verbalize and demonstrate good understanding of current HEP including some supine and seated LE strengthening exercises. Reviewed and patient performed 10 reps today with minimal cueing. Will keep goal active to progress HEP as appropriate. 05/28/2020- patient reports continues to perform LE strengthening as able and some help from caregiver as able. No changes at this time to home program as patient is limited to supine/seated  therex. 07/02/2020- Patient continues to report compliance with current supine and seated LE strengtheing home program and states no questions or concerns regarding her plan.    Time 12    Period Weeks    Status Achieved      PT LONG TERM GOAL #2   Title The patient will demonstrate at least 10 point improvement on FOTO score indicating an improved ability to perform functional activities.    Baseline on eval (/720) score 12; 10/22/19: 12; 12/10/19: 18, 12/13: 18: 04/07/2020- Will assess next visit. 07/02/2020- Will obtain next visit. 09/24/2020= Will obtain next visit. 11/12/2020= 12; 12/22/2020= 12; 03/30/2021= 17; 06/24/2021= Will  assess next visit    Time 12    Period Weeks    Status On-going    Target Date 09/16/21      PT LONG TERM GOAL #3   Title The patient will demonstrate lateral scooting  for assistance with transfers with minA to maximize independence.    Baseline 10/22/19: Pt requires modA+1 for lateral scooting; 12/10/19: modA+1, 12/13: modA-maxA, 12/27: modA-maxA, 03/10/2020- ModA-MaxA- increased verbal cues and visual demonstration. 04/07/2020- Continued Max assist to perform forward/lateral scoot. 04/21/2020- Patient was able to demo slight lateral scoot with CGA approx 12 in then fatigued requiring max assist- she is able to scoot forward by leaning without significant issues. 05/28/2020- While sitting in chair- patient able to perform forward and lateral scoot with VC/visual demo and increased time allowed- CGA today but not consistent yet- will keep goal active at this time to continue to focus on strengthening and improving technique. 07/02/2020- Patient able to demonstrate minimal ability to laterally shift weigh on mat table requiring max assist yet is able to forward scoot out to edge of mat table with min Assist. 08/20/2020 - Patient was able to demonstrate minimal lateral scooting at edge of mat while holding feet still so they did not slip on tile surface. She continues to be able to  demonstrate scoot forward and uses tilt option to recline to position safely back into seat of power wheelchair. 09/25/2020- Patient able to sit on mat and minimally scoot laterally but requires min/mod A for efficient scooting. Will end goal at this time due to plateau in progress with scooting over extended time.    Time 12    Period Weeks    Status Not Met      PT LONG TERM GOAL #4   Title The patient will demonstrate a squat pivot transfer with minimum assistance to maximize independence and mobility.    Baseline 10/22/19: unable to safely attempt at this time; 12/10/19: unable to safely attempt at this time, 01/14/20: unable to safety attempt at this time. 03/10/2020- Patient able to perform Stand pivot transfer with Maximal assist. 04/07/2020- Patient continues to require Max assist with sit to stand- will assist SPT next visit. 04/21/2020- Patient presents with Max assist to perform Sit to stand Transfer- unable to pivot or move Left LE well without difficulty. 07/02/2020= Max assist with SPT and minimal ability to pivot. 7/202/2022-Patient able to stand with minimal assist this visit (as good as CGA last visit) and able to move right foot to pivot and performing with moderate assist today. 09/24/2020- Patient able to perform sit to stand with Min assist from lift chair. Patient has demo inconsistency and will keep goal active.  12/17/2020- Goal is currently not appropriate as patient fractured her ankle and currently NWB. 12/22/2020=Goal is currently not appropriate as patient fractured her ankle and currently NWB.  03/30/2021=Patient was able to demo min assist to stand holding onto PT back of arms yet continues to require max assist to pivot as she was unable to pivot or turn her legs consistently today. 06/24/2021=Able to stand with varying ability (anywhere from min/mod/max A) - last visit able to stand pivot transfer and move right LE as previously unable. 07/06/2021- Patient continues to stand with mod  assist and able to move Right LE to pivot to right from w/c to mat table.    Time 12    Period Weeks    Status On-going    Target Date 09/16/21      PT LONG  TERM GOAL #5   Title The patient will demonstrate sitting without UE support for 2-5 minutes at EOB to improve participation and maximize independence with ADLs.    Baseline 10/22/19: Pt able to sit unsupported for at least 2 minutes at edge of bed. 04/21/2020- Patient continues to demo good sitting at edge of mat > 5 miin without difficulty or back support.    Time 12    Period Weeks    Status Achieved      PT LONG TERM GOAL #6   Title Patient will demonstrate improved functional LE strength as seen by consistent ability to stand > 2 min (3 out of 4 trials) with Mod/Max A for improved LE strength with transfers.    Baseline 05/28/2020- Patient able to inconsistent stand 1-2 min right now in //bars with Mod/Max A. 07/02/2020- Patient was able to progress last 2 visit to standing using bilateral platform attachment between 1-2 min which is a progression from the parallel bars. 08/20/2020- Patient performed static standing with min- mod A using bilateral platform attachment on walker for 6 min 30 sec today with assist for trunk control and intermittent verbal cues for posture. 09/24/2020- Patient has consistently been able to stand >2 min in // bars and when using bilateral Platform walker. 12/23/2020= Will reopen this goal as patient should transition back to weightbearing and again practice standing in new cert. 03/30/2021=1) 1 min 23 sec   2)  8mn 40 sec  3) 1 min 50 sec   Left LE hyperextended with increased difficulty tucking/extending hips today. Requiring more mod assist to remain standing. 06/24/2021=Patient was able to stand 5 trials and able to stand > 2 min for 1 of the 5 trials. 1) 1 min 40 sec 2) 2 min 38 sec 3) 38 sec (difficulty adjusting in standing and reported left LE discomfort 4) 1 min 28 sec 5) 1 min 35 sec  -rest break between each  trial. However- Patient did demonstrate less overall UE support and more erect posture with ability to maintain extended hips over feet as previously much more difficult to maintain.    Time 12    Period Weeks    Status On-going    Target Date 09/15/21      PT LONG TERM GOAL #7   Title Patient/caregiver will demonstrate assisted transfer using [place] Lift consistently Independently for improved ability to transfer at home from lift chair to bed.    Baseline 09/24/2020- Patient requires hoyer lift (Dependent) for safe transfers.  12/17/2020- Patient currently still dependent on hoyer as she is currently non-weight bearing due to recent ankle fracture. 03/30/2021- Patient stated she has not tried. Agreeable to attempt in near future. 06/24/2021= Patient continues to rely on hoyer transfers at home.    Time 12    Period Weeks    Status Deferred    Target Date 06/22/21      PT LONG TERM GOAL #8   Title Patient will demo improved right LE quad strength as seen by ability to extend knee to 20 deg from 0 or less while performing a Long arc quad (for improved terminal knee ext and more full ROM)  for improved ability to bearing weight with Right LE.    Baseline 12/22/2020=able to extend knee to approx 35 deg from zero during long arc quad; 2/27/203- WIll reassess next visit; 5/232023=23 deg from zero    Time 12    Period Weeks    Status On-going    Target Date  09/16/21      PT LONG TERM GOAL  #9   TITLE Patient will ambulate > 100 feet using LiteGait exhibiting reciprocal steps to prepare for more weighted ambulation in future.    Baseline 06/24/2021- Patient recently on 06/22/2021 ambulated approx 75 feet with litegait - but visits before only approx 35 feet. 07/06/2021- Patient able to ambulate approx 50 feet today using litegait and 2 person assist.    Time 12    Period Weeks    Status On-going    Target Date 09/16/21                   Plan - 07/06/21 1302     Clinical Impression  Statement Patient exhibited increased difficulty today with advancing right LE vs. Previous sessions- Having to stop litegait to allow time to advance right LE. She was overall fatigued and requested to stop during walk. She had increased difficulty shifting weight despite being unweighed today. She remains motivated and will benefit from continued training using litegait. Patient will benefit from skilled Physical Therapy services to assist in improving functional mobility and strength to improve patient's quality of life. Patient's condition has the potential to improve in response to therapy. Maximum improvement is yet to be obtained. The anticipated improvement is attainable and reasonable in a generally predictable time.    Personal Factors and Comorbidities Age;Time since onset of injury/illness/exacerbation;Comorbidity 3+;Fitness    Comorbidities HTN, quadriparesis, history of DVT, neurogenic bladder    Examination-Activity Limitations Bathing;Hygiene/Grooming;Squat;Bed Mobility;Lift;Bend;Stand;Engineer, manufacturing;Toileting;Self Feeding;Transfers;Continence;Sit;Dressing;Sleep;Carry    Examination-Participation Restrictions Church;Laundry;Cleaning;Medication Management;Community Activity;Meal Prep;Interpersonal Relationship    Stability/Clinical Decision Making Evolving/Moderate complexity    Rehab Potential Fair    PT Frequency 2x / week    PT Duration 12 weeks    PT Treatment/Interventions ADLs/Self Care Home Management;Electrical Stimulation;Therapeutic activities;Wheelchair mobility training;Vasopneumatic Device;Joint Manipulations;Vestibular;Passive range of motion;Patient/family education;Therapeutic exercise;DME Instruction;Biofeedback;Aquatic Therapy;Moist Heat;Gait training;Balance training;Orthotic Fit/Training;Dry needling;Energy conservation;Taping;Splinting;Neuromuscular re-education;Cryotherapy;Ultrasound;Functional mobility training    PT Next Visit Plan Continue with  progressive standing/transfer training; Progress LE Strengthening. Gait training with lite gait. Obtain latest FOTO next visit    PT Home Exercise Plan no changes    Consulted and Agree with Plan of Care Patient             Patient will benefit from skilled therapeutic intervention in order to improve the following deficits and impairments:  Abnormal gait, Decreased balance, Decreased endurance, Decreased mobility, Difficulty walking, Hypomobility, Impaired sensation, Decreased range of motion, Improper body mechanics, Impaired perceived functional ability, Decreased activity tolerance, Decreased knowledge of use of DME, Decreased safety awareness, Decreased strength, Impaired flexibility, Impaired UE functional use, Postural dysfunction  Visit Diagnosis: Muscle weakness (generalized)  Other lack of coordination  Abnormality of gait and mobility  Difficulty in walking, not elsewhere classified  Other abnormalities of gait and mobility  Unsteadiness on feet  Central cord syndrome, sequela ([facility])     Problem List Patient Active Problem List   Diagnosis Date Noted   Acute appendicitis with appendiceal abscess 05/30/2018   Hypocalcemia 02/15/2018   Dysuria 02/15/2018   Bilateral lower extremity edema 02/11/2018   Vaginal yeast infection 01/30/2018   Chest tightness 01/27/2018   Healthcare-associated pneumonia 01/25/2018   Pleural effusion, not elsewhere classified 01/25/2018   Acute deep vein thrombosis (DVT) of left lower extremity ([place]) 01/24/2018   Acute deep vein thrombosis (DVT) of right lower extremity ([place]) 01/24/2018   Chronic allergic rhinitis 01/24/2018   Depression with anxiety 01/24/2018   UTI due to  Klebsiella species 01/24/2018   Protein-calorie malnutrition, severe ([place]) 01/24/2018   S/P insertion of IVC (inferior vena caval) filter 01/24/2018   Reactive depression    Hypokalemia    Leukocytosis    Essential hypertension    Trauma    Tetraparesis ([facility])     Neuropathic pain    Neurogenic bowel    Neurogenic bladder    Benign essential HTN    Acute blood loss anemia    Central cord syndrome at C6 level of cervical spinal cord ([place]) 11/29/2017   Central cord syndrome ([place]) 11/29/2017    Lewis Moccasin, PT 07/06/2021, 3:13 PM  [place] MAIN [facility] SERVICES [place], [place], [address] Phone: [phone]   Fax:  [phone]  Name: Sherry [name] MRN: [id number] Date of Birth: 1934/06/11

## 2021-07-08 ENCOUNTER — Ambulatory Visit: Payer: Medicare HMO

## 2021-07-08 ENCOUNTER — Ambulatory Visit: Payer: Medicare HMO | Admitting: Occupational Therapy

## 2021-07-08 DIAGNOSIS — M1990 Unspecified osteoarthritis, unspecified site: Secondary | ICD-10-CM | POA: Diagnosis not present

## 2021-07-08 DIAGNOSIS — G8220 Paraplegia, unspecified: Secondary | ICD-10-CM | POA: Diagnosis not present

## 2021-07-09 ENCOUNTER — Encounter: Payer: Self-pay | Admitting: Internal Medicine

## 2021-07-13 ENCOUNTER — Ambulatory Visit: Payer: Medicare HMO

## 2021-07-13 ENCOUNTER — Ambulatory Visit: Payer: Medicare HMO | Admitting: Occupational Therapy

## 2021-07-13 DIAGNOSIS — R269 Unspecified abnormalities of gait and mobility: Secondary | ICD-10-CM

## 2021-07-13 DIAGNOSIS — R278 Other lack of coordination: Secondary | ICD-10-CM

## 2021-07-13 DIAGNOSIS — M6281 Muscle weakness (generalized): Secondary | ICD-10-CM | POA: Diagnosis not present

## 2021-07-13 DIAGNOSIS — R262 Difficulty in walking, not elsewhere classified: Secondary | ICD-10-CM

## 2021-07-13 DIAGNOSIS — S14129S Central cord syndrome at unspecified level of cervical spinal cord, sequela: Secondary | ICD-10-CM

## 2021-07-13 DIAGNOSIS — R2689 Other abnormalities of gait and mobility: Secondary | ICD-10-CM

## 2021-07-13 DIAGNOSIS — R2681 Unsteadiness on feet: Secondary | ICD-10-CM

## 2021-07-13 NOTE — Therapy (Signed)
[place] MAIN [facility] SERVICES [address] [place], [place], [address] Phone: [phone]   Fax:  [phone]  Occupational Therapy Treatment  Patient Details  Name: Sherry [name] MRN: [id number] Date of Birth: 20-Feb-1934 No data recorded  Encounter Date: 07/13/2021   OT End of Session - 07/13/21 1124     Visit Number 129    Number of Visits 154    Date for OT Re-Evaluation 09/28/21    OT Start Time 1102    OT Stop Time 1145    OT Time Calculation (min) 43 min    Equipment Utilized During Treatment moist heat    Activity Tolerance Patient tolerated treatment well    Behavior During Therapy [facility] for tasks assessed/performed             Past Medical History:  Diagnosis Date   Acute blood loss anemia    Arthritis    Cancer ([place])    skin   Central cord syndrome at C6 level of cervical spinal cord ([place]) 11/29/2017   Hypertension    Protein-calorie malnutrition, severe ([place]) 01/24/2018   S/P insertion of IVC (inferior vena caval) filter 01/24/2018   Tetraparesis ([place])     Past Surgical History:  Procedure Laterality Date   ANTERIOR CERVICAL DECOMP/DISCECTOMY FUSION N/A 11/29/2017   Procedure: Cervical five-six, six-seven Anterior Cervical Decompression Fusion;  Surgeon: Judith Part, MD;  Location: [facility];  Service: Neurosurgery;  Laterality: N/A;  Cervical five-six, six-seven Anterior Cervical Decompression Fusion   CATARACT EXTRACTION     IR IVC FILTER PLMT / S&I /IMG GUID/MOD SED  12/08/2017    There were no vitals filed for this visit.   Subjective Assessment - 07/13/21 1123     Subjective  Pt finished a 1000 piece puzzle this weekend.    Pertinent History Patient s/p fall November 29, 2017 resulting in diagnosis of central cord syndrome at C 6 level.  She has had therapy in multiple venues but with recent move to [facility], therapy staff recommended she seek outpatient therapy for her needs.    Patient Stated  Goals Patient would like to be able to move better in bed, stand and perform self care tasks.    Currently in Pain? No/denies    Pain Onset --               Therapeutic Exercise Pt tolerated AAROM bilateral wrist extension, composite flexion/extension, bilateral digit MP, PIP, and DIP flexion in conjunction with moist heat. Tolerated seated core and BUE 2# dowel exercises chest press, overhead press, obliques, and shoulder abduction.    Therapeutic Activity  Pt worked on reaching for [employer] moving them through 3 lowest levels of vertical rungs, cues for anterior weight shifting to utilize core. Reaches 3rd highest ring with R or L hand only when placed at midline, unable to reach across midline to complete.            OT Education - 07/13/21 1124     Education Details BUE ROM, strengthening, [place] HEP    Person(s) Educated Patient    Methods Explanation;Demonstration;Verbal cues    Comprehension Verbalized understanding;Need further instruction;Verbal cues required                 OT Long Term Goals - 07/06/21 1106       OT LONG TERM GOAL #1   Title Patient and caregiver will demonstrate understanding of home exercise program for ROM.    Baseline  06/01/2021: Pt. now has new restorative aides who are assiting with daily ROM at [facility] now. Pt. has recently been in quarantine for COVID-19. Pt.'s current restorative aide is retiring at the end of December. Pt. continues to have a restorative aide rehab aide assist her wih exercises at [facility]. 8/8: Independently completes/directs HEP mulitple times/week. 12/17/2020: Pt. has a new restorative aide who will be resuming ROM program.    Time 12    Period Weeks    Status Achieved      OT LONG TERM GOAL #2   Title Patient will demonstrate ability to don shirt with min assist from seated position.    Baseline Pt. is indepedent with a larger shirt, and continues to require increased time to complete,    Time 12     Period Weeks    Status Achieved      OT LONG TERM GOAL #4   Title Patient will demonstrate improved composite finger flexion to hold deodorant to apply to underarms.    Baseline 01/12/2021:  Pt. is progressing, and is able to reach to grasp, and hold narrow cones. 10/20/2020: Pt. conitnues to make steady progress with bilateral digit flexion.Pt. continues to present with stiifness/tightness which is hindering pt.'s ability to formulate a full composite fist. Pt. continues to present with improving finger flexion of R hand for a partial gross grasp, but unable to grasp and hold items. pt. conitnues to present with limited digit flexion of L hand. Pt. has active left thumb abduction. 8/8: holds cup with 2 hands, continues to have limited composite finger flexion L worse than R. 02/16/2021: pt. continues to present with limited bilateral gross composite fisting. 04/08/2021: pt. continues to present with limited digit flexion affecting her ability to formulate bilateral composite fisting. 110th visivt: Pt. is able to hold the utensils in preparation for self-feeding. 06/01/2021: continues to have difficulty formulating a fist, however has improved with self-feeding, and is now using standard utensils.07/06/2021: Pt. continues to have difficulty holding deoderant, and use deoderant. pt. is improving with holding similiar sized objects.    Time 12    Period Weeks    Status On-going    Target Date 09/28/21      Long Term Additional Goals   Additional Long Term Goals Yes      OT LONG TERM GOAL #6   Title Patient will increase right UE ROM to comb the back of her hair with modified independence.    Baseline 01/12/2022: Pt. is able to comb her hair, reaching to the back of her hair.. 10/20/2020: Pt. continues to progress wtith RRUE ROM, and is able to use a pic for the back of her head, however is unable to brush the back of her hair. 07/30/2020: Pt. is now able to comb the back of her hair with a pic, however  continues to work on completing the task efficiently. 8/8: Achieves with great effort, has assistance for efficiancy. 02/16/2021: Pt. is now able to reach, and brush the back of her hair. 04/08/2021: pt. continues to improve reach towards the back of her hair. 110th visit: Pt. is to reach towards the back of her hair, however has difficulty sustaining her arms in elevaton for the entire duration of perfroming hair cre thoroughly. 06/01/2021: Pt. is able to reach to the back of her head, however has difficutly with thoroughness. 07/06/2021: Pt. continues to improve with reaching to the back of her hair, however has difficulty with thoroughly perfroming the task in  the back of her hair.    Time 12    Period Weeks    Status Partially Met    Target Date 09/28/21      OT LONG TERM GOAL #8   Title Pt. will write, and sign her name with 100% legibility, and modified independence.    Baseline 01/12/2021: Pt. continues to fill out her daily menu and complete puzzles. 10/20/2020: Pt. continues to make progress overall. Pt. continues to consistently fill out her daily menu, and writing for crossword puzzles. Pt. is able to maintain grasp on a wide width pen. Contineus to work on Media planner. 8/8: writes daily, continues to work on improving speed. 02/16/2021: Pt. continues to present wiht limited writing legibility, fluctuating daily. 04/08/2021: Pt. continues to to be able to fill out her daily menu.limited writing legibility for daily puzzles.110th visit: Pt. continues to present with limited legibility, and efficiency for completing daily puzzles. 06/01/2021: pt. continues to work on the consistency with legibility 07/06/2021: Pt. continues to work on improving consistency with legibility.    Time 12    Period Weeks    Status On-going    Target Date 09/28/21      OT LONG TERM GOAL  #9   TITLE Pt. will turn pages in a book with modified independence.    Baseline 07/06/2021: Pt. has difficulty turning pages.     Time 12    Period Weeks    Status New    Target Date 07/06/21                   Plan - 07/13/21 1124     Clinical Impression Statement Required fewer rest breaks for therex. Achieved 3rd highest level of [place] vertical rings requiring 2 hands to initiate then completing highest ring with L then R only. Pt continues to present with ROM limitations in the left wrist extensors, and bilateral digit flexors. Pt. Continues to work towards improving bilateral wrist, and digit ROM in order to work towards being able to grasp, hold, and use common ADL, and IADL items efficiently.    OT Occupational Profile and History Detailed Assessment- Review of Records and additional review of physical, cognitive, psychosocial history related to current functional performance    Occupational performance deficits (Please refer to evaluation for details): ADL's;IADL's;Leisure;Social Participation    Body Structure / Function / Physical Skills ADL;Continence;Dexterity;Flexibility;Strength;ROM;Balance;Coordination;FMC;IADL;Endurance;UE functional use;Decreased knowledge of use of DME;GMC    Psychosocial Skills Coping Strategies    Rehab Potential Fair    Clinical Decision Making Several treatment options, min-mod task modification necessary    Comorbidities Affecting Occupational Performance: Presence of comorbidities impacting occupational performance    Comorbidities impacting occupational performance description: contractures of bilateral hands, dependent transfers,    Modification or Assistance to Complete Evaluation  No modification of tasks or assist necessary to complete eval    OT Frequency 2x / week    OT Duration 12 weeks    OT Treatment/Interventions Self-care/ADL training;Cryotherapy;Therapeutic exercise;DME and/or AE instruction;Balance training;Neuromuscular education;Manual Therapy;Splinting;Moist Heat;Passive range of motion;Therapeutic activities;Patient/family education    Plan continue to  progress ROM of digits, wrists and shoulders as it pertains to completion of ADL tasks that pt values.    Consulted and Agree with Plan of Care Patient             Patient will benefit from skilled therapeutic intervention in order to improve the following deficits and impairments:   Body Structure / Function / Physical Skills: ADL, Continence, Dexterity,  Flexibility, Strength, ROM, Balance, Coordination, FMC, IADL, Endurance, UE functional use, Decreased knowledge of use of DME, GMC   Psychosocial Skills: Coping Strategies   Visit Diagnosis: Muscle weakness (generalized)  Other lack of coordination  Central cord syndrome, sequela (HCC)    Problem List Patient Active Problem List   Diagnosis Date Noted   Acute appendicitis with appendiceal abscess 05/30/2018   Hypocalcemia 02/15/2018   Dysuria 02/15/2018   Bilateral lower extremity edema 02/11/2018   Vaginal yeast infection 01/30/2018   Chest tightness 01/27/2018   Healthcare-associated pneumonia 01/25/2018   Pleural effusion, not elsewhere classified 01/25/2018   Acute deep vein thrombosis (DVT) of left lower extremity ([place]) 01/24/2018   Acute deep vein thrombosis (DVT) of right lower extremity ([place]) 01/24/2018   Chronic allergic rhinitis 01/24/2018   Depression with anxiety 01/24/2018   UTI due to Klebsiella species 01/24/2018   Protein-calorie malnutrition, severe ([place]) 01/24/2018   S/P insertion of IVC (inferior vena caval) filter 01/24/2018   Reactive depression    Hypokalemia    Leukocytosis    Essential hypertension    Trauma    Tetraparesis (HCC)    Neuropathic pain    Neurogenic bowel    Neurogenic bladder    Benign essential HTN    Acute blood loss anemia    Central cord syndrome at C6 level of cervical spinal cord ([place]) 11/29/2017   Central cord syndrome ([place]) 11/29/2017    Dessie Coma, M.S. OTR/L  07/13/21, 12:47 PM  ascom [phone]   [place] MAIN  [facility] SERVICES [address] [place], [place], [address] Phone: [phone]   Fax:  [phone]  Name: Sherry [name] MRN: [id number] Date of Birth: 1934/05/18

## 2021-07-13 NOTE — Therapy (Signed)
[place] MAIN [facility] SERVICES [address] [place], [place], [address] Phone: [phone]   Fax:  [phone]  Physical Therapy Treatment  Patient Details  Name: Sherry [name] MRN: [id number] Date of Birth: 04/18/1934 No data recorded  Encounter Date: 07/13/2021   PT End of Session - 07/13/21 1142     Visit Number 151    Number of Visits 172    Date for PT Re-Evaluation 09/16/21    Authorization Type [employer] FOTO performed by PT on eval (7/20), score 12, Progress note on 03/10/2020; PN on 08/20/2020    Authorization Time Period Recert 07/03/353- 9/74/1638; PN on 05/06/3644; Recert 09/03/2120- 48/25/0037; PN on 0/48/8891; Recert 69/45/0388- 09/28/32; Recert 10/18/9148-5/69/7948; Recert 0/16/5537- 4/82/7078    Progress Note Due on Visit 160    PT Start Time 6754    PT Stop Time 1220    PT Time Calculation (min) 35 min    Equipment Utilized During Treatment Gait belt    Activity Tolerance Patient tolerated treatment well;Patient limited by fatigue    Behavior During Therapy [facility] for tasks assessed/performed             Past Medical History:  Diagnosis Date   Acute blood loss anemia    Arthritis    Cancer ([place])    skin   Central cord syndrome at C6 level of cervical spinal cord ([place]) 11/29/2017   Hypertension    Protein-calorie malnutrition, severe ([place]) 01/24/2018   S/P insertion of IVC (inferior vena caval) filter 01/24/2018   Tetraparesis ([place])     Past Surgical History:  Procedure Laterality Date   ANTERIOR CERVICAL DECOMP/DISCECTOMY FUSION N/A 11/29/2017   Procedure: Cervical five-six, six-seven Anterior Cervical Decompression Fusion;  Surgeon: Judith Part, MD;  Location: [facility];  Service: Neurosurgery;  Laterality: N/A;  Cervical five-six, six-seven Anterior Cervical Decompression Fusion   CATARACT EXTRACTION     IR IVC FILTER PLMT / S&I /IMG GUID/MOD SED  12/08/2017    There were no vitals filed for this  visit.   Subjective Assessment - 07/13/21 1130     Subjective Patient reports she is having some technical difficulties with her power wheelchair- Foot plate among other issues. States doing well otherwise.    Pertinent History Pt is an 87 yo female that fell in 2019, fractured vertebrae in her neck and in her low back per family. 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 After hospital stay, pt discharged to [facility] for ~1 month, experienced severe UTI as well as bilateral DVT (IVC filter placed). Discharged to [facility], stayed for about a year, and then moved to [facility] in April 2021. Was receiving PT, but per family facility reported that did not have adequate equipment to maximize PT for her. Pt until about 1 month ago was practicing sit to stand transfers with 1-2 people, and working on static standing. Has a brace for L foot due to PF. Pt currently needs assistance with all ADLs (able to assist with donning/doffing her shirt), bed baths, and needs a hoyer lift for transfers. Able to drive power wheelchair without assistance.    Patient Stated Goals "to stand up and walk"    Currently in Pain? No/denies             INTERVENTIONS:    Therapeutic exercises:   -Seated Hip  march AAROM - 2 sets of 15 reps each LE  - Seated knee ext (2.5 lb AW on left and no  weight on right) - 3 sets of 12 reps each  - Seated hip add squeeze with ball- hold 5 sec 2 sets of 10 reps  -Gluteal sets - hold 5 sec x 2 sets of 10 reps  - Seated hip abd 2 sets x 12 reps.  -Seated ham curl using Blue TB 2 sets x 12 reps BLE  - Sit to stand x 10 reps with HHA of  2 persons (one on each side) - VC to sit slower for more eccentric control.   Education provided throughout session via VC/TC and demonstration to facilitate movement at target joints and correct muscle activation for all testing and exercises performed. Rationale for Evaluation and Treatment Rehabilitation                            PT Education - 07/13/21 1141     Education Details Exercise technique    Person(s) Educated Patient    Methods Explanation;Demonstration;Tactile cues;Verbal cues    Comprehension Verbalized understanding;Returned demonstration;Verbal cues required;Tactile cues required;Need further instruction              PT Short Term Goals - 03/10/20 2211       PT SHORT TERM GOAL #1   Title The patient will perform initial HEP with minimum assistance in order to improve strength and function.    Baseline Patient demonstrating independence with initial HEP as of 03/10/2020 with no questions or difficulty.    Time 4    Period Weeks    Status Achieved    Target Date 02/11/20               PT Long Term Goals - 07/06/21 1510       PT LONG TERM GOAL #1   Title The patient will be compliant with finalized HEP with minimum assistance in preparation for self management and maintenance of condition.    Baseline 03/10/2020- Patient familiar with initial HEP and will keep goal active as HEP becomes more progressive including possible transfers and standing activities. 04/07/2020- Patient continues to report compliance with LE strengthening home exercises without questions or concerns. 04/21/2020- Patient able to verbalize and demonstrate good understanding of current HEP including some supine and seated LE strengthening exercises. Reviewed and patient performed 10 reps today with minimal cueing. Will keep goal active to progress HEP as appropriate. 05/28/2020- patient reports continues to perform LE strengthening as able and some help from caregiver as able. No changes at this time to home program as patient is limited to supine/seated therex. 07/02/2020- Patient continues to report compliance with current supine and seated LE strengtheing home program and states no questions or concerns regarding her plan.    Time 12    Period Weeks    Status Achieved       PT LONG TERM GOAL #2   Title The patient will demonstrate at least 10 point improvement on FOTO score indicating an improved ability to perform functional activities.    Baseline on eval (/720) score 12; 10/22/19: 12; 12/10/19: 18, 12/13: 18: 04/07/2020- Will assess next visit. 07/02/2020- Will obtain next visit. 09/24/2020= Will obtain next visit. 11/12/2020= 12; 12/22/2020= 12; 03/30/2021= 17; 06/24/2021= Will assess next visit    Time 12    Period Weeks    Status On-going    Target Date 09/16/21      PT LONG TERM GOAL #3   Title The patient will demonstrate lateral scooting  for assistance  with transfers with minA to maximize independence.    Baseline 10/22/19: Pt requires modA+1 for lateral scooting; 12/10/19: modA+1, 12/13: modA-maxA, 12/27: modA-maxA, 03/10/2020- ModA-MaxA- increased verbal cues and visual demonstration. 04/07/2020- Continued Max assist to perform forward/lateral scoot. 04/21/2020- Patient was able to demo slight lateral scoot with CGA approx 12 in then fatigued requiring max assist- she is able to scoot forward by leaning without significant issues. 05/28/2020- While sitting in chair- patient able to perform forward and lateral scoot with VC/visual demo and increased time allowed- CGA today but not consistent yet- will keep goal active at this time to continue to focus on strengthening and improving technique. 07/02/2020- Patient able to demonstrate minimal ability to laterally shift weigh on mat table requiring max assist yet is able to forward scoot out to edge of mat table with min Assist. 08/20/2020 - Patient was able to demonstrate minimal lateral scooting at edge of mat while holding feet still so they did not slip on tile surface. She continues to be able to demonstrate scoot forward and uses tilt option to recline to position safely back into seat of power wheelchair. 09/25/2020- Patient able to sit on mat and minimally scoot laterally but requires min/mod A for efficient scooting. Will end  goal at this time due to plateau in progress with scooting over extended time.    Time 12    Period Weeks    Status Not Met      PT LONG TERM GOAL #4   Title The patient will demonstrate a squat pivot transfer with minimum assistance to maximize independence and mobility.    Baseline 10/22/19: unable to safely attempt at this time; 12/10/19: unable to safely attempt at this time, 01/14/20: unable to safety attempt at this time. 03/10/2020- Patient able to perform Stand pivot transfer with Maximal assist. 04/07/2020- Patient continues to require Max assist with sit to stand- will assist SPT next visit. 04/21/2020- Patient presents with Max assist to perform Sit to stand Transfer- unable to pivot or move Left LE well without difficulty. 07/02/2020= Max assist with SPT and minimal ability to pivot. 7/202/2022-Patient able to stand with minimal assist this visit (as good as CGA last visit) and able to move right foot to pivot and performing with moderate assist today. 09/24/2020- Patient able to perform sit to stand with Min assist from lift chair. Patient has demo inconsistency and will keep goal active.  12/17/2020- Goal is currently not appropriate as patient fractured her ankle and currently NWB. 12/22/2020=Goal is currently not appropriate as patient fractured her ankle and currently NWB.  03/30/2021=Patient was able to demo min assist to stand holding onto PT back of arms yet continues to require max assist to pivot as she was unable to pivot or turn her legs consistently today. 06/24/2021=Able to stand with varying ability (anywhere from min/mod/max A) - last visit able to stand pivot transfer and move right LE as previously unable. 07/06/2021- Patient continues to stand with mod assist and able to move Right LE to pivot to right from w/c to mat table.    Time 12    Period Weeks    Status On-going    Target Date 09/16/21      PT LONG TERM GOAL #5   Title The patient will demonstrate sitting without UE support  for 2-5 minutes at EOB to improve participation and maximize independence with ADLs.    Baseline 10/22/19: Pt able to sit unsupported for at least 2 minutes at edge of bed.  04/21/2020- Patient continues to demo good sitting at edge of mat > 5 miin without difficulty or back support.    Time 12    Period Weeks    Status Achieved      PT LONG TERM GOAL #6   Title Patient will demonstrate improved functional LE strength as seen by consistent ability to stand > 2 min (3 out of 4 trials) with Mod/Max A for improved LE strength with transfers.    Baseline 05/28/2020- Patient able to inconsistent stand 1-2 min right now in //bars with Mod/Max A. 07/02/2020- Patient was able to progress last 2 visit to standing using bilateral platform attachment between 1-2 min which is a progression from the parallel bars. 08/20/2020- Patient performed static standing with min- mod A using bilateral platform attachment on walker for 6 min 30 sec today with assist for trunk control and intermittent verbal cues for posture. 09/24/2020- Patient has consistently been able to stand >2 min in // bars and when using bilateral Platform walker. 12/23/2020= Will reopen this goal as patient should transition back to weightbearing and again practice standing in new cert. 03/30/2021=1) 1 min 23 sec   2)  59mn 40 sec  3) 1 min 50 sec   Left LE hyperextended with increased difficulty tucking/extending hips today. Requiring more mod assist to remain standing. 06/24/2021=Patient was able to stand 5 trials and able to stand > 2 min for 1 of the 5 trials. 1) 1 min 40 sec 2) 2 min 38 sec 3) 38 sec (difficulty adjusting in standing and reported left LE discomfort 4) 1 min 28 sec 5) 1 min 35 sec  -rest break between each trial. However- Patient did demonstrate less overall UE support and more erect posture with ability to maintain extended hips over feet as previously much more difficult to maintain.    Time 12    Period Weeks    Status On-going    Target  Date 09/15/21      PT LONG TERM GOAL #7   Title Patient/caregiver will demonstrate assisted transfer using [place] Lift consistently Independently for improved ability to transfer at home from lift chair to bed.    Baseline 09/24/2020- Patient requires hoyer lift (Dependent) for safe transfers.  12/17/2020- Patient currently still dependent on hoyer as she is currently non-weight bearing due to recent ankle fracture. 03/30/2021- Patient stated she has not tried. Agreeable to attempt in near future. 06/24/2021= Patient continues to rely on hoyer transfers at home.    Time 12    Period Weeks    Status Deferred    Target Date 06/22/21      PT LONG TERM GOAL #8   Title Patient will demo improved right LE quad strength as seen by ability to extend knee to 20 deg from 0 or less while performing a Long arc quad (for improved terminal knee ext and more full ROM)  for improved ability to bearing weight with Right LE.    Baseline 12/22/2020=able to extend knee to approx 35 deg from zero during long arc quad; 2/27/203- WIll reassess next visit; 5/232023=23 deg from zero    Time 12    Period Weeks    Status On-going    Target Date 09/16/21      PT LONG TERM GOAL  #9   TITLE Patient will ambulate > 100 feet using LiteGait exhibiting reciprocal steps to prepare for more weighted ambulation in future.    Baseline 06/24/2021- Patient recently on 06/22/2021 ambulated approx 75  feet with litegait - but visits before only approx 35 feet. 07/06/2021- Patient able to ambulate approx 50 feet today using litegait and 2 person assist.    Time 12    Period Weeks    Status On-going    Target Date 09/16/21                   Plan - 07/13/21 1143     Clinical Impression Statement Patient participated well with focus on LE strengthening today. She was able to accept some resistance with left LE and able to perform more right AROM overall today. Patient was fatigued after session but able to complete 2 rounds of  each exercise except sit to stand. She able to stand with min assist today and able to extend knees and hip well without leaning forward or knees buckling with standing. Patient will benefit from skilled Physical Therapy services to assist in improving functional mobility and strength to improve patient's quality of life    Personal Factors and Comorbidities Age;Time since onset of injury/illness/exacerbation;Comorbidity 3+;Fitness    Comorbidities HTN, quadriparesis, history of DVT, neurogenic bladder    Examination-Activity Limitations Bathing;Hygiene/Grooming;Squat;Bed Mobility;Lift;Bend;Stand;Engineer, manufacturing;Toileting;Self Feeding;Transfers;Continence;Sit;Dressing;Sleep;Carry    Examination-Participation Restrictions Church;Laundry;Cleaning;Medication Management;Community Activity;Meal Prep;Interpersonal Relationship    Stability/Clinical Decision Making Evolving/Moderate complexity    Rehab Potential Fair    PT Frequency 2x / week    PT Duration 12 weeks    PT Treatment/Interventions ADLs/Self Care Home Management;Electrical Stimulation;Therapeutic activities;Wheelchair mobility training;Vasopneumatic Device;Joint Manipulations;Vestibular;Passive range of motion;Patient/family education;Therapeutic exercise;DME Instruction;Biofeedback;Aquatic Therapy;Moist Heat;Gait training;Balance training;Orthotic Fit/Training;Dry needling;Energy conservation;Taping;Splinting;Neuromuscular re-education;Cryotherapy;Ultrasound;Functional mobility training    PT Next Visit Plan Continue with progressive standing/transfer training; Progress LE Strengthening. Gait training with lite gait. Obtain latest FOTO next visit    PT Home Exercise Plan no changes    Consulted and Agree with Plan of Care Patient             Patient will benefit from skilled therapeutic intervention in order to improve the following deficits and impairments:  Abnormal gait, Decreased balance, Decreased endurance, Decreased  mobility, Difficulty walking, Hypomobility, Impaired sensation, Decreased range of motion, Improper body mechanics, Impaired perceived functional ability, Decreased activity tolerance, Decreased knowledge of use of DME, Decreased safety awareness, Decreased strength, Impaired flexibility, Impaired UE functional use, Postural dysfunction  Visit Diagnosis: Muscle weakness (generalized)  Other lack of coordination  Abnormality of gait and mobility  Difficulty in walking, not elsewhere classified  Other abnormalities of gait and mobility  Unsteadiness on feet     Problem List Patient Active Problem List   Diagnosis Date Noted   Acute appendicitis with appendiceal abscess 05/30/2018   Hypocalcemia 02/15/2018   Dysuria 02/15/2018   Bilateral lower extremity edema 02/11/2018   Vaginal yeast infection 01/30/2018   Chest tightness 01/27/2018   Healthcare-associated pneumonia 01/25/2018   Pleural effusion, not elsewhere classified 01/25/2018   Acute deep vein thrombosis (DVT) of left lower extremity ([place]) 01/24/2018   Acute deep vein thrombosis (DVT) of right lower extremity ([place]) 01/24/2018   Chronic allergic rhinitis 01/24/2018   Depression with anxiety 01/24/2018   UTI due to Klebsiella species 01/24/2018   Protein-calorie malnutrition, severe ([place]) 01/24/2018   S/P insertion of IVC (inferior vena caval) filter 01/24/2018   Reactive depression    Hypokalemia    Leukocytosis    Essential hypertension    Trauma    Tetraparesis ([facility])    Neuropathic pain    Neurogenic bowel    Neurogenic bladder    Benign  essential HTN    Acute blood loss anemia    Central cord syndrome at C6 level of cervical spinal cord ([facility]) 11/29/2017   Central cord syndrome [facility]) 11/29/2017    Lewis Moccasin, PT 07/13/2021, 1:15 PM  [place] MAIN [facility] SERVICES [address] [place], [place], [address] Phone: [phone]   Fax:  [phone]  Name: Sherry [name] MRN: [id number] Date of Birth: [birth year]

## 2021-07-15 ENCOUNTER — Ambulatory Visit: Payer: Medicare HMO | Admitting: Occupational Therapy

## 2021-07-15 ENCOUNTER — Ambulatory Visit: Payer: Medicare HMO

## 2021-07-15 DIAGNOSIS — M6281 Muscle weakness (generalized): Secondary | ICD-10-CM

## 2021-07-15 DIAGNOSIS — S14129S Central cord syndrome at unspecified level of cervical spinal cord, sequela: Secondary | ICD-10-CM

## 2021-07-15 DIAGNOSIS — R278 Other lack of coordination: Secondary | ICD-10-CM

## 2021-07-15 DIAGNOSIS — R2689 Other abnormalities of gait and mobility: Secondary | ICD-10-CM

## 2021-07-15 DIAGNOSIS — R2681 Unsteadiness on feet: Secondary | ICD-10-CM

## 2021-07-15 DIAGNOSIS — R262 Difficulty in walking, not elsewhere classified: Secondary | ICD-10-CM

## 2021-07-15 DIAGNOSIS — R269 Unspecified abnormalities of gait and mobility: Secondary | ICD-10-CM

## 2021-07-15 NOTE — Therapy (Signed)
[place] MAIN [facility] SERVICES [address] [place], [place], [address] Phone: [phone]   Fax:  [phone]  Physical Therapy Treatment  Patient Details  Name: Sherry [name] MRN: [id number] Date of Birth: 24-Apr-1934 No data recorded  Encounter Date: 07/15/2021   PT End of Session - 07/15/21 1116     Visit Number 152    Number of Visits 172    Date for PT Re-Evaluation 09/16/21    Authorization Type [employer]are FOTO performed by PT on eval (7/20), score 12, Progress note on 03/10/2020; PN on 08/20/2020    Authorization Time Period Recert 06/04/6268- 3/50/0938; PN on 02/09/2991; Recert 08/16/9676- 93/81/0175; PN on 02/02/5850; Recert 77/82/4235- 3/61/4431; Recert 5/40/0867-07/21/5091; Recert 2/67/1245- 09/10/9831    Progress Note Due on Visit 160    PT Start Time 8250    PT Stop Time 1225    PT Time Calculation (min) 40 min    Equipment Utilized During Treatment Gait belt    Activity Tolerance Patient tolerated treatment well;Patient limited by fatigue    Behavior During Therapy WFL for tasks assessed/performed             Past Medical History:  Diagnosis Date   Acute blood loss anemia    Arthritis    Cancer ([place])    skin   Central cord syndrome at C6 level of cervical spinal cord ([place]) 11/29/2017   Hypertension    Protein-calorie malnutrition, severe ([place]) 01/24/2018   S/P insertion of IVC (inferior vena caval) filter 01/24/2018   Tetraparesis ([place])     Past Surgical History:  Procedure Laterality Date   ANTERIOR CERVICAL DECOMP/DISCECTOMY FUSION N/A 11/29/2017   Procedure: Cervical five-six, six-seven Anterior Cervical Decompression Fusion;  Surgeon: Judith Part, MD;  Location: [facility];  Service: Neurosurgery;  Laterality: N/A;  Cervical five-six, six-seven Anterior Cervical Decompression Fusion   CATARACT EXTRACTION     IR IVC FILTER PLMT / S&I /IMG GUID/MOD SED  12/08/2017    There were no vitals filed for this  visit.   Subjective Assessment - 07/15/21 1115     Pertinent History Pt is an 87 yo female that fell in 2019, fractured vertebrae in her neck and in her low back per family. 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 After hospital stay, pt discharged to [facility] for ~1 month, experienced severe UTI as well as bilateral DVT (IVC filter placed). Discharged to [facility], stayed for about a year, and then moved to [facility] in April 2021. Was receiving PT, but per family facility reported that did not have adequate equipment to maximize PT for her. Pt until about 1 month ago was practicing sit to stand transfers with 1-2 people, and working on static standing. Has a brace for L foot due to PF. Pt currently needs assistance with all ADLs (able to assist with donning/doffing her shirt), bed baths, and needs a hoyer lift for transfers. Able to drive power wheelchair without assistance.    Patient Stated Goals "to stand up and walk"    Currently in Pain? No/denies             INTERVENTIONS:    Therapeutic Activities:    Patient performed stand pivot transfer from power w/c to mat table- Mod-  assist to stand and pivot today over to mat able. Patient able to pivot on left and move right LE with mod assist of PT supporting at her trunk and mod assist to pivot to mat  table.     Bed mobility- Patient was able to roll left and right with min assist of PT and caregiver  to don the harness of litegait and then able to sit back up at edge of bed with mod assist with LE and min assist with upper trunk.  Upon trying to use litegait from sitting to standing today - observed that the safety harness was riding up more than usual or appropriate. Manuevered patient back to supine to try to adjust straps and performed again with same effect. Did not try to walk due to safety concerns. Patient denied any pain today with harness but this author did not feel safe attempting to have her walk without  harness supporting around her hips (greater trochanter region).   Education provided throughout session via VC/TC and demonstration to facilitate movement at target joints and correct muscle activation for all testing and exercises performed. Rationale for Evaluation and Treatment Rehabilitation                            PT Education - 07/15/21 1115     Education Details Gait sequencing strategies    Person(s) Educated Patient    Methods Explanation;Demonstration;Tactile cues;Verbal cues    Comprehension Verbalized understanding;Returned demonstration;Verbal cues required;Tactile cues required;Need further instruction              PT Short Term Goals - 03/10/20 2211       PT SHORT TERM GOAL #1   Title The patient will perform initial HEP with minimum assistance in order to improve strength and function.    Baseline Patient demonstrating independence with initial HEP as of 03/10/2020 with no questions or difficulty.    Time 4    Period Weeks    Status Achieved    Target Date 02/11/20               PT Long Term Goals - 07/06/21 1510       PT LONG TERM GOAL #1   Title The patient will be compliant with finalized HEP with minimum assistance in preparation for self management and maintenance of condition.    Baseline 03/10/2020- Patient familiar with initial HEP and will keep goal active as HEP becomes more progressive including possible transfers and standing activities. 04/07/2020- Patient continues to report compliance with LE strengthening home exercises without questions or concerns. 04/21/2020- Patient able to verbalize and demonstrate good understanding of current HEP including some supine and seated LE strengthening exercises. Reviewed and patient performed 10 reps today with minimal cueing. Will keep goal active to progress HEP as appropriate. 05/28/2020- patient reports continues to perform LE strengthening as able and some help from caregiver as able.  No changes at this time to home program as patient is limited to supine/seated therex. 07/02/2020- Patient continues to report compliance with current supine and seated LE strengtheing home program and states no questions or concerns regarding her plan.    Time 12    Period Weeks    Status Achieved      PT LONG TERM GOAL #2   Title The patient will demonstrate at least 10 point improvement on FOTO score indicating an improved ability to perform functional activities.    Baseline on eval (/720) score 12; 10/22/19: 12; 12/10/19: 18, 12/13: 18: 04/07/2020- Will assess next visit. 07/02/2020- Will obtain next visit. 09/24/2020= Will obtain next visit. 11/12/2020= 12; 12/22/2020= 12; 03/30/2021= 17; 06/24/2021= Will assess next visit  Time 12    Period Weeks    Status On-going    Target Date 09/16/21      PT LONG TERM GOAL #3   Title The patient will demonstrate lateral scooting  for assistance with transfers with minA to maximize independence.    Baseline 10/22/19: Pt requires modA+1 for lateral scooting; 12/10/19: modA+1, 12/13: modA-maxA, 12/27: modA-maxA, 03/10/2020- ModA-MaxA- increased verbal cues and visual demonstration. 04/07/2020- Continued Max assist to perform forward/lateral scoot. 04/21/2020- Patient was able to demo slight lateral scoot with CGA approx 12 in then fatigued requiring max assist- she is able to scoot forward by leaning without significant issues. 05/28/2020- While sitting in chair- patient able to perform forward and lateral scoot with VC/visual demo and increased time allowed- CGA today but not consistent yet- will keep goal active at this time to continue to focus on strengthening and improving technique. 07/02/2020- Patient able to demonstrate minimal ability to laterally shift weigh on mat table requiring max assist yet is able to forward scoot out to edge of mat table with min Assist. 08/20/2020 - Patient was able to demonstrate minimal lateral scooting at edge of mat while holding feet  still so they did not slip on tile surface. She continues to be able to demonstrate scoot forward and uses tilt option to recline to position safely back into seat of power wheelchair. 09/25/2020- Patient able to sit on mat and minimally scoot laterally but requires min/mod A for efficient scooting. Will end goal at this time due to plateau in progress with scooting over extended time.    Time 12    Period Weeks    Status Not Met      PT LONG TERM GOAL #4   Title The patient will demonstrate a squat pivot transfer with minimum assistance to maximize independence and mobility.    Baseline 10/22/19: unable to safely attempt at this time; 12/10/19: unable to safely attempt at this time, 01/14/20: unable to safety attempt at this time. 03/10/2020- Patient able to perform Stand pivot transfer with Maximal assist. 04/07/2020- Patient continues to require Max assist with sit to stand- will assist SPT next visit. 04/21/2020- Patient presents with Max assist to perform Sit to stand Transfer- unable to pivot or move Left LE well without difficulty. 07/02/2020= Max assist with SPT and minimal ability to pivot. 7/202/2022-Patient able to stand with minimal assist this visit (as good as CGA last visit) and able to move right foot to pivot and performing with moderate assist today. 09/24/2020- Patient able to perform sit to stand with Min assist from lift chair. Patient has demo inconsistency and will keep goal active.  12/17/2020- Goal is currently not appropriate as patient fractured her ankle and currently NWB. 12/22/2020=Goal is currently not appropriate as patient fractured her ankle and currently NWB.  03/30/2021=Patient was able to demo min assist to stand holding onto PT back of arms yet continues to require max assist to pivot as she was unable to pivot or turn her legs consistently today. 06/24/2021=Able to stand with varying ability (anywhere from min/mod/max A) - last visit able to stand pivot transfer and move right LE  as previously unable. 07/06/2021- Patient continues to stand with mod assist and able to move Right LE to pivot to right from w/c to mat table.    Time 12    Period Weeks    Status On-going    Target Date 09/16/21      PT LONG TERM GOAL #5   Title  The patient will demonstrate sitting without UE support for 2-5 minutes at EOB to improve participation and maximize independence with ADLs.    Baseline 10/22/19: Pt able to sit unsupported for at least 2 minutes at edge of bed. 04/21/2020- Patient continues to demo good sitting at edge of mat > 5 miin without difficulty or back support.    Time 12    Period Weeks    Status Achieved      PT LONG TERM GOAL #6   Title Patient will demonstrate improved functional LE strength as seen by consistent ability to stand > 2 min (3 out of 4 trials) with Mod/Max A for improved LE strength with transfers.    Baseline 05/28/2020- Patient able to inconsistent stand 1-2 min right now in //bars with Mod/Max A. 07/02/2020- Patient was able to progress last 2 visit to standing using bilateral platform attachment between 1-2 min which is a progression from the parallel bars. 08/20/2020- Patient performed static standing with min- mod A using bilateral platform attachment on walker for 6 min 30 sec today with assist for trunk control and intermittent verbal cues for posture. 09/24/2020- Patient has consistently been able to stand >2 min in // bars and when using bilateral Platform walker. 12/23/2020= Will reopen this goal as patient should transition back to weightbearing and again practice standing in new cert. 03/30/2021=1) 1 min 23 sec   2)  72mn 40 sec  3) 1 min 50 sec   Left LE hyperextended with increased difficulty tucking/extending hips today. Requiring more mod assist to remain standing. 06/24/2021=Patient was able to stand 5 trials and able to stand > 2 min for 1 of the 5 trials. 1) 1 min 40 sec 2) 2 min 38 sec 3) 38 sec (difficulty adjusting in standing and reported left LE  discomfort 4) 1 min 28 sec 5) 1 min 35 sec  -rest break between each trial. However- Patient did demonstrate less overall UE support and more erect posture with ability to maintain extended hips over feet as previously much more difficult to maintain.    Time 12    Period Weeks    Status On-going    Target Date 09/15/21      PT LONG TERM GOAL #7   Title Patient/caregiver will demonstrate assisted transfer using [place] Lift consistently Independently for improved ability to transfer at home from lift chair to bed.    Baseline 09/24/2020- Patient requires hoyer lift (Dependent) for safe transfers.  12/17/2020- Patient currently still dependent on hoyer as she is currently non-weight bearing due to recent ankle fracture. 03/30/2021- Patient stated she has not tried. Agreeable to attempt in near future. 06/24/2021= Patient continues to rely on hoyer transfers at home.    Time 12    Period Weeks    Status Deferred    Target Date 06/22/21      PT LONG TERM GOAL #8   Title Patient will demo improved right LE quad strength as seen by ability to extend knee to 20 deg from 0 or less while performing a Long arc quad (for improved terminal knee ext and more full ROM)  for improved ability to bearing weight with Right LE.    Baseline 12/22/2020=able to extend knee to approx 35 deg from zero during long arc quad; 2/27/203- WIll reassess next visit; 5/232023=23 deg from zero    Time 12    Period Weeks    Status On-going    Target Date 09/16/21  PT LONG TERM GOAL  #9   TITLE Patient will ambulate > 100 feet using LiteGait exhibiting reciprocal steps to prepare for more weighted ambulation in future.    Baseline 06/24/2021- Patient recently on 06/22/2021 ambulated approx 75 feet with litegait - but visits before only approx 35 feet. 07/06/2021- Patient able to ambulate approx 50 feet today using litegait and 2 person assist.    Time 12    Period Weeks    Status On-going    Target Date 09/16/21                    Plan - 07/15/21 1116     Clinical Impression Statement Patient did perform some standing with use of gait belt - full weight bearing today - with some lateral weight shifting - x 20-25 reps. She performed 2 more rounds of standing but right knee buckled and patient reported fatigue.  Patient will benefit from skilled Physical Therapy services to assist in improving functional mobility and strength to improve patient's quality of life.    Personal Factors and Comorbidities Age;Time since onset of injury/illness/exacerbation;Comorbidity 3+;Fitness    Comorbidities HTN, quadriparesis, history of DVT, neurogenic bladder    Examination-Activity Limitations Bathing;Hygiene/Grooming;Squat;Bed Mobility;Lift;Bend;Stand;Engineer, manufacturing;Toileting;Self Feeding;Transfers;Continence;Sit;Dressing;Sleep;Carry    Examination-Participation Restrictions Church;Laundry;Cleaning;Medication Management;Community Activity;Meal Prep;Interpersonal Relationship    Stability/Clinical Decision Making Evolving/Moderate complexity    Rehab Potential Fair    PT Frequency 2x / week    PT Duration 12 weeks    PT Treatment/Interventions ADLs/Self Care Home Management;Electrical Stimulation;Therapeutic activities;Wheelchair mobility training;Vasopneumatic Device;Joint Manipulations;Vestibular;Passive range of motion;Patient/family education;Therapeutic exercise;DME Instruction;Biofeedback;Aquatic Therapy;Moist Heat;Gait training;Balance training;Orthotic Fit/Training;Dry needling;Energy conservation;Taping;Splinting;Neuromuscular re-education;Cryotherapy;Ultrasound;Functional mobility training    PT Next Visit Plan Continue with progressive standing/transfer training; Progress LE Strengthening. Gait training with lite gait. Obtain latest FOTO next visit    PT Home Exercise Plan no changes    Consulted and Agree with Plan of Care Patient             Patient will benefit from skilled therapeutic  intervention in order to improve the following deficits and impairments:  Abnormal gait, Decreased balance, Decreased endurance, Decreased mobility, Difficulty walking, Hypomobility, Impaired sensation, Decreased range of motion, Improper body mechanics, Impaired perceived functional ability, Decreased activity tolerance, Decreased knowledge of use of DME, Decreased safety awareness, Decreased strength, Impaired flexibility, Impaired UE functional use, Postural dysfunction  Visit Diagnosis: Muscle weakness (generalized)  Other lack of coordination  Central cord syndrome, sequela ([facility])  Abnormality of gait and mobility  Difficulty in walking, not elsewhere classified  Other abnormalities of gait and mobility  Unsteadiness on feet     Problem List Patient Active Problem List   Diagnosis Date Noted   Acute appendicitis with appendiceal abscess 05/30/2018   Hypocalcemia 02/15/2018   Dysuria 02/15/2018   Bilateral lower extremity edema 02/11/2018   Vaginal yeast infection 01/30/2018   Chest tightness 01/27/2018   Healthcare-associated pneumonia 01/25/2018   Pleural effusion, not elsewhere classified 01/25/2018   Acute deep vein thrombosis (DVT) of left lower extremity ([place]) 01/24/2018   Acute deep vein thrombosis (DVT) of right lower extremity ([place]) 01/24/2018   Chronic allergic rhinitis 01/24/2018   Depression with anxiety 01/24/2018   UTI due to Klebsiella species 01/24/2018   Protein-calorie malnutrition, severe ([place]) 01/24/2018   S/P insertion of IVC (inferior vena caval) filter 01/24/2018   Reactive depression    Hypokalemia    Leukocytosis    Essential hypertension    Trauma    Tetraparesis ([place])  Neuropathic pain    Neurogenic bowel    Neurogenic bladder    Benign essential HTN    Acute blood loss anemia    Central cord syndrome at C6 level of cervical spinal cord ([place]) 11/29/2017   Central cord syndrome ([place]) 11/29/2017    Lewis Moccasin, PT 07/15/2021,  5:46 PM  [place] MAIN [facility] SERVICES [address] [place], [place], [address] Phone: [phone]   Fax:  [phone]  Name: BRIENA SWINGLER MRN: [id number] Date of Birth: 1934-06-30

## 2021-07-15 NOTE — Therapy (Signed)
[place] MAIN [facility] SERVICES [address] [place], [place], [address] Phone: [phone]   Fax:  [phone]  Occupational Therapy Treatment  Patient Details  Name: Sherry [name] MRN: [id number] Date of Birth: 05/07/1934 No data recorded  Encounter Date: 07/15/2021   OT End of Session - 07/15/21 1048     Visit Number 130    Number of Visits 154    Date for OT Re-Evaluation 09/28/21    OT Start Time 1100    OT Stop Time 1145    OT Time Calculation (min) 45 min    Equipment Utilized During Treatment moist heat    Activity Tolerance Patient tolerated treatment well    Behavior During Therapy [facility] for tasks assessed/performed             Past Medical History:  Diagnosis Date   Acute blood loss anemia    Arthritis    Cancer ([place])    skin   Central cord syndrome at C6 level of cervical spinal cord ([place]) 11/29/2017   Hypertension    Protein-calorie malnutrition, severe ([place]) 01/24/2018   S/P insertion of IVC (inferior vena caval) filter 01/24/2018   Tetraparesis ([place])     Past Surgical History:  Procedure Laterality Date   ANTERIOR CERVICAL DECOMP/DISCECTOMY FUSION N/A 11/29/2017   Procedure: Cervical five-six, six-seven Anterior Cervical Decompression Fusion;  Surgeon: Judith Part, MD;  Location: [place];  Service: Neurosurgery;  Laterality: N/A;  Cervical five-six, six-seven Anterior Cervical Decompression Fusion   CATARACT EXTRACTION     IR IVC FILTER PLMT / S&I /IMG GUID/MOD SED  12/08/2017    There were no vitals filed for this visit.   Subjective Assessment - 07/15/21 1048     Subjective  Pt reports loving bird watching but [place] [facility] because it's too far.    Patient is accompanied by: Family member    Pertinent History Patient s/p fall November 29, 2017 resulting in diagnosis of central cord syndrome at C 6 level.  She has had therapy in multiple venues but with recent  move to [facility], therapy staff recommended she seek outpatient therapy for her needs.    Patient Stated Goals Patient would like to be able to move better in bed, stand and perform self care tasks.    Pain Onset 1 to 4 weeks ago              Therapeutic Exercise Pt tolerated AAROM bilateral wrist extension, composite flexion/extension, lumbricals, and thumb flexion/extension in conjunction with moist heat. 1 set x 30 reps no weight 3 direction punches overhead/lateral/forward, assist to achieve full shoulder flexion. 20 reps scapular retraction.Tolerated increased reps with no added weight this session. Seated 3 direction crunches reaching past knees to work obliques 1 set x 20 reps.    Therapeutic Activities Pt worked on reaching using the [employer]. Required use of both hands to move through 2nd/3rd level of vertical dowels, unable to reach 4th level. Worked on grasping shapes from tabletop using one hand and reaching across midline to place on lowest dowel set at head height.           OT Education - 07/15/21 1048     Education Details BUE ROM HEP    Person(s) Educated Patient    Methods Explanation;Demonstration;Verbal cues    Comprehension Verbalized understanding;Need further instruction;Verbal cues required  OT Long Term Goals - 07/06/21 1106       OT LONG TERM GOAL #1   Title Patient and caregiver will demonstrate understanding of home exercise program for ROM.    Baseline 06/01/2021: Pt. now has new restorative aides who are assiting with daily ROM at [facility] now. Pt. has recently been in quarantine for COVID-19. Pt.'s current restorative aide is retiring at the end of December. Pt. continues to have a restorative aide rehab aide assist her wih exercises at [facility]. 8/8: Independently completes/directs HEP mulitple times/week. 12/17/2020: Pt. has a new restorative aide who will be resuming ROM program.    Time 12    Period Weeks     Status Achieved      OT LONG TERM GOAL #2   Title Patient will demonstrate ability to don shirt with min assist from seated position.    Baseline Pt. is indepedent with a larger shirt, and continues to require increased time to complete,    Time 12    Period Weeks    Status Achieved      OT LONG TERM GOAL #4   Title Patient will demonstrate improved composite finger flexion to hold deodorant to apply to underarms.    Baseline 01/12/2021:  Pt. is progressing, and is able to reach to grasp, and hold narrow cones. 10/20/2020: Pt. conitnues to make steady progress with bilateral digit flexion.Pt. continues to present with stiifness/tightness which is hindering pt.'s ability to formulate a full composite fist. Pt. continues to present with improving finger flexion of R hand for a partial gross grasp, but unable to grasp and hold items. pt. conitnues to present with limited digit flexion of L hand. Pt. has active left thumb abduction. 8/8: holds cup with 2 hands, continues to have limited composite finger flexion L worse than R. 02/16/2021: pt. continues to present with limited bilateral gross composite fisting. 04/08/2021: pt. continues to present with limited digit flexion affecting her ability to formulate bilateral composite fisting. 110th visivt: Pt. is able to hold the utensils in preparation for self-feeding. 06/01/2021: continues to have difficulty formulating a fist, however has improved with self-feeding, and is now using standard utensils.07/06/2021: Pt. continues to have difficulty holding deoderant, and use deoderant. pt. is improving with holding similiar sized objects.    Time 12    Period Weeks    Status On-going    Target Date 09/28/21      Long Term Additional Goals   Additional Long Term Goals Yes      OT LONG TERM GOAL #6   Title Patient will increase right UE ROM to comb the back of her hair with modified independence.    Baseline 01/12/2022: Pt. is able to comb her hair, reaching to  the back of her hair.. 10/20/2020: Pt. continues to progress wtith RRUE ROM, and is able to use a pic for the back of her head, however is unable to brush the back of her hair. 07/30/2020: Pt. is now able to comb the back of her hair with a pic, however continues to work on completing the task efficiently. 8/8: Achieves with great effort, has assistance for efficiancy. 02/16/2021: Pt. is now able to reach, and brush the back of her hair. 04/08/2021: pt. continues to improve reach towards the back of her hair. 110th visit: Pt. is to reach towards the back of her hair, however has difficulty sustaining her arms in elevaton for the entire duration of perfroming hair cre thoroughly. 06/01/2021: Pt.  is able to reach to the back of her head, however has difficutly with thoroughness. 07/06/2021: Pt. continues to improve with reaching to the back of her hair, however has difficulty with thoroughly perfroming the task in the back of her hair.    Time 12    Period Weeks    Status Partially Met    Target Date 09/28/21      OT LONG TERM GOAL #8   Title Pt. will write, and sign her name with 100% legibility, and modified independence.    Baseline 01/12/2021: Pt. continues to fill out her daily menu and complete puzzles. 10/20/2020: Pt. continues to make progress overall. Pt. continues to consistently fill out her daily menu, and writing for crossword puzzles. Pt. is able to maintain grasp on a wide width pen. Contineus to work on Media planner. 8/8: writes daily, continues to work on improving speed. 02/16/2021: Pt. continues to present wiht limited writing legibility, fluctuating daily. 04/08/2021: Pt. continues to to be able to fill out her daily menu.limited writing legibility for daily puzzles.110th visit: Pt. continues to present with limited legibility, and efficiency for completing daily puzzles. 06/01/2021: pt. continues to work on the consistency with legibility 07/06/2021: Pt. continues to work on improving  consistency with legibility.    Time 12    Period Weeks    Status On-going    Target Date 09/28/21      OT LONG TERM GOAL  #9   TITLE Pt. will turn pages in a book with modified independence.    Baseline 07/06/2021: Pt. has difficulty turning pages.    Time 12    Period Weeks    Status New    Target Date 07/06/21                   Plan - 07/15/21 1049     Clinical Impression Statement Goals and measurements updated at recertiication on 02/07/08 and remain appropriate. Required fewer rest breaks for therex, increased sets with no weight addedd. Completed first 3 levels of shape tower, worked in passing shapes from R to L hand before placing onto shortest level set to head height. Pt continues to present with ROM limitations in the left wrist extensors, and bilateral digit flexors. Pt. Continues to work towards improving bilateral wrist, and digit ROM in order to work towards being able to grasp, hold, and use common ADL, and IADL items efficiently.    OT Occupational Profile and History Detailed Assessment- Review of Records and additional review of physical, cognitive, psychosocial history related to current functional performance    Occupational performance deficits (Please refer to evaluation for details): ADL's;IADL's;Leisure;Social Participation    Body Structure / Function / Physical Skills ADL;Continence;Dexterity;Flexibility;Strength;ROM;Balance;Coordination;FMC;IADL;Endurance;UE functional use;Decreased knowledge of use of DME;GMC    Psychosocial Skills Coping Strategies    Rehab Potential Fair    Clinical Decision Making Several treatment options, min-mod task modification necessary    Comorbidities Affecting Occupational Performance: Presence of comorbidities impacting occupational performance    Comorbidities impacting occupational performance description: contractures of bilateral hands, dependent transfers,    Modification or Assistance to Complete Evaluation  No  modification of tasks or assist necessary to complete eval    OT Frequency 2x / week    OT Duration 12 weeks    OT Treatment/Interventions Self-care/ADL training;Cryotherapy;Therapeutic exercise;DME and/or AE instruction;Balance training;Neuromuscular education;Manual Therapy;Splinting;Moist Heat;Passive range of motion;Therapeutic activities;Patient/family education    Plan continue to progress ROM of digits, wrists and shoulders as it pertains to completion  of ADL tasks that pt values.    Consulted and Agree with Plan of Care Patient             Patient will benefit from skilled therapeutic intervention in order to improve the following deficits and impairments:   Body Structure / Function / Physical Skills: ADL, Continence, Dexterity, Flexibility, Strength, ROM, Balance, Coordination, FMC, IADL, Endurance, UE functional use, Decreased knowledge of use of DME, GMC   Psychosocial Skills: Coping Strategies   Visit Diagnosis: Muscle weakness (generalized)  Other lack of coordination  Central cord syndrome, sequela (HCC)    Problem List Patient Active Problem List   Diagnosis Date Noted   Acute appendicitis with appendiceal abscess 05/30/2018   Hypocalcemia 02/15/2018   Dysuria 02/15/2018   Bilateral lower extremity edema 02/11/2018   Vaginal yeast infection 01/30/2018   Chest tightness 01/27/2018   Healthcare-associated pneumonia 01/25/2018   Pleural effusion, not elsewhere classified 01/25/2018   Acute deep vein thrombosis (DVT) of left lower extremity ([place]) 01/24/2018   Acute deep vein thrombosis (DVT) of right lower extremity ([place]) 01/24/2018   Chronic allergic rhinitis 01/24/2018   Depression with anxiety 01/24/2018   UTI due to Klebsiella species 01/24/2018   Protein-calorie malnutrition, severe ([place]) 01/24/2018   S/P insertion of IVC (inferior vena caval) filter 01/24/2018   Reactive depression    Hypokalemia    Leukocytosis    Essential hypertension    Trauma     Tetraparesis (HCC)    Neuropathic pain    Neurogenic bowel    Neurogenic bladder    Benign essential HTN    Acute blood loss anemia    Central cord syndrome at C6 level of cervical spinal cord ([place]) 11/29/2017   Central cord syndrome ([place]) 11/29/2017   Dessie Coma, M.S. OTR/L  07/15/21, 11:39 AM  ascom [phone]   [place] MAIN [facility] SERVICES [place], [place], [address] Phone: [phone]   Fax:  [phone]  Name: SOLYMAR GRACE MRN: [id number] Date of Birth: 03-31-1934

## 2021-07-20 ENCOUNTER — Ambulatory Visit: Payer: Medicare HMO

## 2021-07-20 DIAGNOSIS — M6281 Muscle weakness (generalized): Secondary | ICD-10-CM

## 2021-07-20 DIAGNOSIS — R262 Difficulty in walking, not elsewhere classified: Secondary | ICD-10-CM

## 2021-07-20 DIAGNOSIS — R278 Other lack of coordination: Secondary | ICD-10-CM

## 2021-07-20 DIAGNOSIS — R269 Unspecified abnormalities of gait and mobility: Secondary | ICD-10-CM

## 2021-07-20 DIAGNOSIS — R2689 Other abnormalities of gait and mobility: Secondary | ICD-10-CM

## 2021-07-20 DIAGNOSIS — S14129S Central cord syndrome at unspecified level of cervical spinal cord, sequela: Secondary | ICD-10-CM

## 2021-07-20 DIAGNOSIS — R2681 Unsteadiness on feet: Secondary | ICD-10-CM

## 2021-07-20 NOTE — Therapy (Signed)
[place] MAIN [facility] SERVICES [address] [place], [place], [address] Phone: [phone]   Fax:  [phone]  Occupational Therapy Treatment  Patient Details  Name: Sherry [name] MRN: [id number] Date of Birth: 14-Oct-1934 No data recorded  Encounter Date: 07/20/2021   OT End of Session - 07/20/21 1210     Visit Number 131    Number of Visits 154    Date for OT Re-Evaluation 09/28/21    Authorization Time Period Progress report period starting 06/01/2021    OT Start Time 41    OT Stop Time 1145    OT Time Calculation (min) 45 min    Equipment Utilized During Treatment moist heat    Activity Tolerance Patient tolerated treatment well    Behavior During Therapy [facility] for tasks assessed/performed             Past Medical History:  Diagnosis Date   Acute blood loss anemia    Arthritis    Cancer ([place])    skin   Central cord syndrome at C6 level of cervical spinal cord ([place]) 11/29/2017   Hypertension    Protein-calorie malnutrition, severe ([place]) 01/24/2018   S/P insertion of IVC (inferior vena caval) filter 01/24/2018   Tetraparesis ([place])     Past Surgical History:  Procedure Laterality Date   ANTERIOR CERVICAL DECOMP/DISCECTOMY FUSION N/A 11/29/2017   Procedure: Cervical five-six, six-seven Anterior Cervical Decompression Fusion;  Surgeon: Judith Part, MD;  Location: [facility];  Service: Neurosurgery;  Laterality: N/A;  Cervical five-six, six-seven Anterior Cervical Decompression Fusion   CATARACT EXTRACTION     IR IVC FILTER PLMT / S&I /IMG GUID/MOD SED  12/08/2017    There were no vitals filed for this visit.   Subjective Assessment - 07/20/21 1209     Subjective  Pt reports she's started combing her hair with a long handled comb.  She reaches the sides but a caregiver typically helps with posterior head.    Patient is accompanied by: Family member    Pertinent History Patient s/p fall November 29, 2017 resulting in  diagnosis of central cord syndrome at C 6 level.  She has had therapy in multiple venues but with recent move to [facility], therapy staff recommended she seek outpatient therapy for her needs.    Patient Stated Goals Patient would like to be able to move better in bed, stand and perform self care tasks.    Currently in Pain? No/denies    Pain Score 0-No pain    Pain Onset 1 to 4 weeks ago           Occupational Therapy Treatment: Therapeutic Exercise: Pt tolerated PROM for bilateral wrist extension, composite flexion/extension, lumbricals, and thumb flexion/extension in conjunction with moist heat.  Instructed pt in cane stretches for increasing shoulder mobility to more easily manage combing hair and other UB ADLs.  Reclined back rest of wc and performed shoulder ER to top of head, shoulder flexion, horiz abd/add, and abd with BUEs on cane x1 set 10 reps and 1 set 5 reps.  Handout issued.    Response to Treatment: Good tolerance to passive stretching this date.  Pt tolerated cane stretches well, initially attempting in sitting, but encouraged supine in wc for better symmetry and form.  Pt agreed gravity helped with her form and to achieve greater end range stretch.  Pt states she continues to use her long handled comb to comb the sides of her head, but a  caregiver typically combs the back where it's harder for pt to sustain this reach.  Pt continues to present with ROM limitations in the left wrist extensors, and bilateral digit flexors. Pt. Continues to work towards improving bilateral wrist, and digit ROM in order to work towards being able to grasp, hold, and use common ADL, and IADL items efficiently.       OT Education - 07/20/21 1210     Education Details cane stretches for bilat shoulder flexibility    Person(s) Educated Patient    Methods Explanation;Demonstration;Verbal cues;Handout;Tactile cues    Comprehension Verbalized understanding;Need further instruction;Verbal cues  required;Returned demonstration;Tactile cues required                 OT Long Term Goals - 07/06/21 1106       OT LONG TERM GOAL #1   Title Patient and caregiver will demonstrate understanding of home exercise program for ROM.    Baseline 06/01/2021: Pt. now has new restorative aides who are assiting with daily ROM at [facility] now. Pt. has recently been in quarantine for COVID-19. Pt.'s current restorative aide is retiring at the end of December. Pt. continues to have a restorative aide rehab aide assist her wih exercises at [facility]. 8/8: Independently completes/directs HEP mulitple times/week. 12/17/2020: Pt. has a new restorative aide who will be resuming ROM program.    Time 12    Period Weeks    Status Achieved      OT LONG TERM GOAL #2   Title Patient will demonstrate ability to don shirt with min assist from seated position.    Baseline Pt. is indepedent with a larger shirt, and continues to require increased time to complete,    Time 12    Period Weeks    Status Achieved      OT LONG TERM GOAL #4   Title Patient will demonstrate improved composite finger flexion to hold deodorant to apply to underarms.    Baseline 01/12/2021:  Pt. is progressing, and is able to reach to grasp, and hold narrow cones. 10/20/2020: Pt. conitnues to make steady progress with bilateral digit flexion.Pt. continues to present with stiifness/tightness which is hindering pt.'s ability to formulate a full composite fist. Pt. continues to present with improving finger flexion of R hand for a partial gross grasp, but unable to grasp and hold items. pt. conitnues to present with limited digit flexion of L hand. Pt. has active left thumb abduction. 8/8: holds cup with 2 hands, continues to have limited composite finger flexion L worse than R. 02/16/2021: pt. continues to present with limited bilateral gross composite fisting. 04/08/2021: pt. continues to present with limited digit flexion affecting her ability  to formulate bilateral composite fisting. 110th visivt: Pt. is able to hold the utensils in preparation for self-feeding. 06/01/2021: continues to have difficulty formulating a fist, however has improved with self-feeding, and is now using standard utensils.07/06/2021: Pt. continues to have difficulty holding deoderant, and use deoderant. pt. is improving with holding similiar sized objects.    Time 12    Period Weeks    Status On-going    Target Date 09/28/21      Long Term Additional Goals   Additional Long Term Goals Yes      OT LONG TERM GOAL #6   Title Patient will increase right UE ROM to comb the back of her hair with modified independence.    Baseline 01/12/2022: Pt. is able to comb her hair, reaching to the  back of her hair.. 10/20/2020: Pt. continues to progress wtith RRUE ROM, and is able to use a pic for the back of her head, however is unable to brush the back of her hair. 07/30/2020: Pt. is now able to comb the back of her hair with a pic, however continues to work on completing the task efficiently. 8/8: Achieves with great effort, has assistance for efficiancy. 02/16/2021: Pt. is now able to reach, and brush the back of her hair. 04/08/2021: pt. continues to improve reach towards the back of her hair. 110th visit: Pt. is to reach towards the back of her hair, however has difficulty sustaining her arms in elevaton for the entire duration of perfroming hair cre thoroughly. 06/01/2021: Pt. is able to reach to the back of her head, however has difficutly with thoroughness. 07/06/2021: Pt. continues to improve with reaching to the back of her hair, however has difficulty with thoroughly perfroming the task in the back of her hair.    Time 12    Period Weeks    Status Partially Met    Target Date 09/28/21      OT LONG TERM GOAL #8   Title Pt. will write, and sign her name with 100% legibility, and modified independence.    Baseline 01/12/2021: Pt. continues to fill out her daily menu and  complete puzzles. 10/20/2020: Pt. continues to make progress overall. Pt. continues to consistently fill out her daily menu, and writing for crossword puzzles. Pt. is able to maintain grasp on a wide width pen. Contineus to work on Media planner. 8/8: writes daily, continues to work on improving speed. 02/16/2021: Pt. continues to present wiht limited writing legibility, fluctuating daily. 04/08/2021: Pt. continues to to be able to fill out her daily menu.limited writing legibility for daily puzzles.110th visit: Pt. continues to present with limited legibility, and efficiency for completing daily puzzles. 06/01/2021: pt. continues to work on the consistency with legibility 07/06/2021: Pt. continues to work on improving consistency with legibility.    Time 12    Period Weeks    Status On-going    Target Date 09/28/21      OT LONG TERM GOAL  #9   TITLE Pt. will turn pages in a book with modified independence.    Baseline 07/06/2021: Pt. has difficulty turning pages.    Time 12    Period Weeks    Status New    Target Date 07/06/21              Plan - 07/20/21 1217     Clinical Impression Statement Good tolerance to passive stretching this date.  Pt tolerated cane stretches well, initially attempting in sitting, but encouraged supine in wc for better symmetry and form.  Pt agreed gravity helped with her form and to achieve greater end range stretch.  Pt states she continues to use her long handled comb to comb the sides of her head, but a caregiver typically combs the back where it's harder for pt to sustain this reach.  Pt continues to present with ROM limitations in the left wrist extensors, and bilateral digit flexors. Pt. Continues to work towards improving bilateral wrist, and digit ROM in order to work towards being able to grasp, hold, and use common ADL, and IADL items efficiently.    OT Occupational Profile and History Detailed Assessment- Review of Records and additional review of  physical, cognitive, psychosocial history related to current functional performance    Occupational performance deficits (Please refer to  evaluation for details): ADL's;IADL's;Leisure;Social Participation    Body Structure / Function / Physical Skills ADL;Continence;Dexterity;Flexibility;Strength;ROM;Balance;Coordination;FMC;IADL;Endurance;UE functional use;Decreased knowledge of use of DME;GMC    Psychosocial Skills Coping Strategies    Rehab Potential Fair    Clinical Decision Making Several treatment options, min-mod task modification necessary    Comorbidities Affecting Occupational Performance: Presence of comorbidities impacting occupational performance    Comorbidities impacting occupational performance description: contractures of bilateral hands, dependent transfers,    Modification or Assistance to Complete Evaluation  No modification of tasks or assist necessary to complete eval    OT Frequency 2x / week    OT Duration 12 weeks    OT Treatment/Interventions Self-care/ADL training;Cryotherapy;Therapeutic exercise;DME and/or AE instruction;Balance training;Neuromuscular education;Manual Therapy;Splinting;Moist Heat;Passive range of motion;Therapeutic activities;Patient/family education    Plan continue to progress ROM of digits, wrists and shoulders as it pertains to completion of ADL tasks that pt values.    Consulted and Agree with Plan of Care Patient             Patient will benefit from skilled therapeutic intervention in order to improve the following deficits and impairments:   Body Structure / Function / Physical Skills: ADL, Continence, Dexterity, Flexibility, Strength, ROM, Balance, Coordination, FMC, IADL, Endurance, UE functional use, Decreased knowledge of use of DME, GMC   Psychosocial Skills: Coping Strategies   Visit Diagnosis: Muscle weakness (generalized)  Other lack of coordination  Central cord syndrome, sequela (HCC)    Problem List Patient Active  Problem List   Diagnosis Date Noted   Acute appendicitis with appendiceal abscess 05/30/2018   Hypocalcemia 02/15/2018   Dysuria 02/15/2018   Bilateral lower extremity edema 02/11/2018   Vaginal yeast infection 01/30/2018   Chest tightness 01/27/2018   Healthcare-associated pneumonia 01/25/2018   Pleural effusion, not elsewhere classified 01/25/2018   Acute deep vein thrombosis (DVT) of left lower extremity ([place]) 01/24/2018   Acute deep vein thrombosis (DVT) of right lower extremity ([place]) 01/24/2018   Chronic allergic rhinitis 01/24/2018   Depression with anxiety 01/24/2018   UTI due to Klebsiella species 01/24/2018   Protein-calorie malnutrition, severe ([place]) 01/24/2018   S/P insertion of IVC (inferior vena caval) filter 01/24/2018   Reactive depression    Hypokalemia    Leukocytosis    Essential hypertension    Trauma    Tetraparesis (HCC)    Neuropathic pain    Neurogenic bowel    Neurogenic bladder    Benign essential HTN    Acute blood loss anemia    Central cord syndrome at C6 level of cervical spinal cord ([place]) 11/29/2017   Central cord syndrome ([place]) 11/29/2017   Leta Speller, MS, OTR/L  Darleene Cleaver, OT 07/20/2021, 12:17 PM  [place] MAIN [facility] SERVICES [address] [place], [place], [address] Phone: [phone]   Fax:  [phone]  Name: GREYSON RICCARDI MRN: [id number] Date of Birth: [birth year]

## 2021-07-21 NOTE — Therapy (Signed)
[place] MAIN [facility] SERVICES [address] [place], [place], [address] Phone: [phone]   Fax:  [phone]  Physical Therapy Treatment  Patient Details  Name: Sherry [name] MRN: [id number] Date of Birth: 06-16-1934 No data recorded  Encounter Date: 07/20/2021   PT End of Session - 07/20/21 1538     Visit Number 153    Number of Visits 172    Date for PT Re-Evaluation 09/16/21    Authorization Type [employer] medicare FOTO performed by PT on eval (7/20), score 12, Progress note on 03/10/2020; PN on 08/20/2020    Authorization Time Period Recert 10/10/8336- 2/50/5397; PN on 07/08/3417; Recert 3/79/0240- 97/35/3299; PN on 2/42/6834; Recert 19/62/2297- 9/89/2119; Recert 05/19/4079-4/48/1856; Recert 04/14/9700- 6/37/8588    Progress Note Due on Visit 160    PT Start Time 1146    PT Stop Time 1227    PT Time Calculation (min) 41 min    Equipment Utilized During Treatment Gait belt    Activity Tolerance Patient tolerated treatment well;Patient limited by fatigue    Behavior During Therapy [facility] for tasks assessed/performed             Past Medical History:  Diagnosis Date   Acute blood loss anemia    Arthritis    Cancer ([place])    skin   Central cord syndrome at C6 level of cervical spinal cord ([place]) 11/29/2017   Hypertension    Protein-calorie malnutrition, severe ([place]) 01/24/2018   S/P insertion of IVC (inferior vena caval) filter 01/24/2018   Tetraparesis ([place])     Past Surgical History:  Procedure Laterality Date   ANTERIOR CERVICAL DECOMP/DISCECTOMY FUSION N/A 11/29/2017   Procedure: Cervical five-six, six-seven Anterior Cervical Decompression Fusion;  Surgeon: Judith Part, MD;  Location: [place];  Service: Neurosurgery;  Laterality: N/A;  Cervical five-six, six-seven Anterior Cervical Decompression Fusion   CATARACT EXTRACTION     IR IVC FILTER PLMT / S&I /IMG GUID/MOD SED  12/08/2017    There were no vitals filed for this  visit.   Subjective Assessment - 07/20/21 1537     Subjective Patient reports had a good weekend and ready to work hard- Would like to try walking again.    Pertinent History Pt is an 87 yo female that fell in 2019, fractured vertebrae in her neck and in her low back per family. 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 After hospital stay, pt discharged to [facility] for ~1 month, experienced severe UTI as well as bilateral DVT (IVC filter placed). Discharged to [facility], stayed for about a year, and then moved to [facility] in April 2021. Was receiving PT, but per family facility reported that did not have adequate equipment to maximize PT for her. Pt until about 1 month ago was practicing sit to stand transfers with 1-2 people, and working on static standing. Has a brace for L foot due to PF. Pt currently needs assistance with all ADLs (able to assist with donning/doffing her shirt), bed baths, and needs a hoyer lift for transfers. Able to drive power wheelchair without assistance.    Patient Stated Goals "to stand up and walk"    Currently in Pain? No/denies              INTERVENTIONS:       Therapeutic Activities:    Patient performed stand pivot transfer from power w/c to mat table- Mod-  assist to stand and pivot today over to mat able. Patient  able to pivot on left and move right LE with mod assist of PT supporting at her trunk and mod assist to pivot to mat table.     Bed mobility- Patient was able to roll left and right with min assist of PT and caregiver  to don the harness of litegait and then able to sit back up at edge of bed with mod assist with LE and min assist with upper trunk.   After several adjustments to harness- Patient was successful in standing with use of litegait- with CGA.   Gait training:   Patient ambulated approx 65 feet today using Litegait with assist of 2 persons- 1 to assist in pulling litegait and PT to provide support at trunk. VC to  lateral weight shift and reminders to focus on left LE to not cross midline. Patient presented with reciprocal steps and ended today due to fatigue.    Education provided throughout session via VC/TC and demonstration to facilitate movement at target joints and correct muscle activation for all testing and exercises performed.   Rationale for Evaluation and Treatment Rehabilitation                        PT Education - 07/21/21 1538     Education Details Gait sequencing    Person(s) Educated Patient    Methods Explanation;Demonstration;Tactile cues;Verbal cues    Comprehension Verbalized understanding;Returned demonstration;Verbal cues required;Tactile cues required;Need further instruction              PT Short Term Goals - 03/10/20 2211       PT SHORT TERM GOAL #1   Title The patient will perform initial HEP with minimum assistance in order to improve strength and function.    Baseline Patient demonstrating independence with initial HEP as of 03/10/2020 with no questions or difficulty.    Time 4    Period Weeks    Status Achieved    Target Date 02/11/20               PT Long Term Goals - 07/06/21 1510       PT LONG TERM GOAL #1   Title The patient will be compliant with finalized HEP with minimum assistance in preparation for self management and maintenance of condition.    Baseline 03/10/2020- Patient familiar with initial HEP and will keep goal active as HEP becomes more progressive including possible transfers and standing activities. 04/07/2020- Patient continues to report compliance with LE strengthening home exercises without questions or concerns. 04/21/2020- Patient able to verbalize and demonstrate good understanding of current HEP including some supine and seated LE strengthening exercises. Reviewed and patient performed 10 reps today with minimal cueing. Will keep goal active to progress HEP as appropriate. 05/28/2020- patient reports continues  to perform LE strengthening as able and some help from caregiver as able. No changes at this time to home program as patient is limited to supine/seated therex. 07/02/2020- Patient continues to report compliance with current supine and seated LE strengtheing home program and states no questions or concerns regarding her plan.    Time 12    Period Weeks    Status Achieved      PT LONG TERM GOAL #2   Title The patient will demonstrate at least 10 point improvement on FOTO score indicating an improved ability to perform functional activities.    Baseline on eval (/720) score 12; 10/22/19: 12; 12/10/19: 18, 12/13: 18: 04/07/2020- Will assess next visit.  07/02/2020- Will obtain next visit. 09/24/2020= Will obtain next visit. 11/12/2020= 12; 12/22/2020= 12; 03/30/2021= 17; 06/24/2021= Will assess next visit    Time 12    Period Weeks    Status On-going    Target Date 09/16/21      PT LONG TERM GOAL #3   Title The patient will demonstrate lateral scooting  for assistance with transfers with minA to maximize independence.    Baseline 10/22/19: Pt requires modA+1 for lateral scooting; 12/10/19: modA+1, 12/13: modA-maxA, 12/27: modA-maxA, 03/10/2020- ModA-MaxA- increased verbal cues and visual demonstration. 04/07/2020- Continued Max assist to perform forward/lateral scoot. 04/21/2020- Patient was able to demo slight lateral scoot with CGA approx 12 in then fatigued requiring max assist- she is able to scoot forward by leaning without significant issues. 05/28/2020- While sitting in chair- patient able to perform forward and lateral scoot with VC/visual demo and increased time allowed- CGA today but not consistent yet- will keep goal active at this time to continue to focus on strengthening and improving technique. 07/02/2020- Patient able to demonstrate minimal ability to laterally shift weigh on mat table requiring max assist yet is able to forward scoot out to edge of mat table with min Assist. 08/20/2020 - Patient was able to  demonstrate minimal lateral scooting at edge of mat while holding feet still so they did not slip on tile surface. She continues to be able to demonstrate scoot forward and uses tilt option to recline to position safely back into seat of power wheelchair. 09/25/2020- Patient able to sit on mat and minimally scoot laterally but requires min/mod A for efficient scooting. Will end goal at this time due to plateau in progress with scooting over extended time.    Time 12    Period Weeks    Status Not Met      PT LONG TERM GOAL #4   Title The patient will demonstrate a squat pivot transfer with minimum assistance to maximize independence and mobility.    Baseline 10/22/19: unable to safely attempt at this time; 12/10/19: unable to safely attempt at this time, 01/14/20: unable to safety attempt at this time. 03/10/2020- Patient able to perform Stand pivot transfer with Maximal assist. 04/07/2020- Patient continues to require Max assist with sit to stand- will assist SPT next visit. 04/21/2020- Patient presents with Max assist to perform Sit to stand Transfer- unable to pivot or move Left LE well without difficulty. 07/02/2020= Max assist with SPT and minimal ability to pivot. 7/202/2022-Patient able to stand with minimal assist this visit (as good as CGA last visit) and able to move right foot to pivot and performing with moderate assist today. 09/24/2020- Patient able to perform sit to stand with Min assist from lift chair. Patient has demo inconsistency and will keep goal active.  12/17/2020- Goal is currently not appropriate as patient fractured her ankle and currently NWB. 12/22/2020=Goal is currently not appropriate as patient fractured her ankle and currently NWB.  03/30/2021=Patient was able to demo min assist to stand holding onto PT back of arms yet continues to require max assist to pivot as she was unable to pivot or turn her legs consistently today. 06/24/2021=Able to stand with varying ability (anywhere from  min/mod/max A) - last visit able to stand pivot transfer and move right LE as previously unable. 07/06/2021- Patient continues to stand with mod assist and able to move Right LE to pivot to right from w/c to mat table.    Time 12    Period Weeks  Status On-going    Target Date 09/16/21      PT LONG TERM GOAL #5   Title The patient will demonstrate sitting without UE support for 2-5 minutes at EOB to improve participation and maximize independence with ADLs.    Baseline 10/22/19: Pt able to sit unsupported for at least 2 minutes at edge of bed. 04/21/2020- Patient continues to demo good sitting at edge of mat > 5 miin without difficulty or back support.    Time 12    Period Weeks    Status Achieved      PT LONG TERM GOAL #6   Title Patient will demonstrate improved functional LE strength as seen by consistent ability to stand > 2 min (3 out of 4 trials) with Mod/Max A for improved LE strength with transfers.    Baseline 05/28/2020- Patient able to inconsistent stand 1-2 min right now in //bars with Mod/Max A. 07/02/2020- Patient was able to progress last 2 visit to standing using bilateral platform attachment between 1-2 min which is a progression from the parallel bars. 08/20/2020- Patient performed static standing with min- mod A using bilateral platform attachment on walker for 6 min 30 sec today with assist for trunk control and intermittent verbal cues for posture. 09/24/2020- Patient has consistently been able to stand >2 min in // bars and when using bilateral Platform walker. 12/23/2020= Will reopen this goal as patient should transition back to weightbearing and again practice standing in new cert. 03/30/2021=1) 1 min 23 sec   2)  74mn 40 sec  3) 1 min 50 sec   Left LE hyperextended with increased difficulty tucking/extending hips today. Requiring more mod assist to remain standing. 06/24/2021=Patient was able to stand 5 trials and able to stand > 2 min for 1 of the 5 trials. 1) 1 min 40 sec 2) 2 min  38 sec 3) 38 sec (difficulty adjusting in standing and reported left LE discomfort 4) 1 min 28 sec 5) 1 min 35 sec  -rest break between each trial. However- Patient did demonstrate less overall UE support and more erect posture with ability to maintain extended hips over feet as previously much more difficult to maintain.    Time 12    Period Weeks    Status On-going    Target Date 09/15/21      PT LONG TERM GOAL #7   Title Patient/caregiver will demonstrate assisted transfer using [place] Lift consistently Independently for improved ability to transfer at home from lift chair to bed.    Baseline 09/24/2020- Patient requires hoyer lift (Dependent) for safe transfers.  12/17/2020- Patient currently still dependent on hoyer as she is currently non-weight bearing due to recent ankle fracture. 03/30/2021- Patient stated she has not tried. Agreeable to attempt in near future. 06/24/2021= Patient continues to rely on hoyer transfers at home.    Time 12    Period Weeks    Status Deferred    Target Date 06/22/21      PT LONG TERM GOAL #8   Title Patient will demo improved right LE quad strength as seen by ability to extend knee to 20 deg from 0 or less while performing a Long arc quad (for improved terminal knee ext and more full ROM)  for improved ability to bearing weight with Right LE.    Baseline 12/22/2020=able to extend knee to approx 35 deg from zero during long arc quad; 2/27/203- WIll reassess next visit; 5/232023=23 deg from zero    Time 12  Period Weeks    Status On-going    Target Date 09/16/21      PT LONG TERM GOAL  #9   TITLE Patient will ambulate > 100 feet using LiteGait exhibiting reciprocal steps to prepare for more weighted ambulation in future.    Baseline 06/24/2021- Patient recently on 06/22/2021 ambulated approx 75 feet with litegait - but visits before only approx 35 feet. 07/06/2021- Patient able to ambulate approx 50 feet today using litegait and 2 person assist.    Time 12     Period Weeks    Status On-going    Target Date 09/16/21                   Plan - 07/20/21 1539     Clinical Impression Statement Patient presents with good motivation today. She was able to resume walking with use of litegait and denied any pain- Able to perform more reciprocal steps overall today and less overall VC for safety and less physical assist with trunk support. Patient will benefit from skilled Physical Therapy services to assist in improving functional mobility and strength to improve patient's quality of life.    Personal Factors and Comorbidities Age;Time since onset of injury/illness/exacerbation;Comorbidity 3+;Fitness    Comorbidities HTN, quadriparesis, history of DVT, neurogenic bladder    Examination-Activity Limitations Bathing;Hygiene/Grooming;Squat;Bed Mobility;Lift;Bend;Stand;Engineer, manufacturing;Toileting;Self Feeding;Transfers;Continence;Sit;Dressing;Sleep;Carry    Examination-Participation Restrictions Church;Laundry;Cleaning;Medication Management;Community Activity;Meal Prep;Interpersonal Relationship    Stability/Clinical Decision Making Evolving/Moderate complexity    Rehab Potential Fair    PT Frequency 2x / week    PT Duration 12 weeks    PT Treatment/Interventions ADLs/Self Care Home Management;Electrical Stimulation;Therapeutic activities;Wheelchair mobility training;Vasopneumatic Device;Joint Manipulations;Vestibular;Passive range of motion;Patient/family education;Therapeutic exercise;DME Instruction;Biofeedback;Aquatic Therapy;Moist Heat;Gait training;Balance training;Orthotic Fit/Training;Dry needling;Energy conservation;Taping;Splinting;Neuromuscular re-education;Cryotherapy;Ultrasound;Functional mobility training    PT Next Visit Plan Continue with progressive standing/transfer training; Progress LE Strengthening. Gait training with lite gait. Obtain latest FOTO next visit    PT Home Exercise Plan no changes    Consulted and Agree with  Plan of Care Patient             Patient will benefit from skilled therapeutic intervention in order to improve the following deficits and impairments:  Abnormal gait, Decreased balance, Decreased endurance, Decreased mobility, Difficulty walking, Hypomobility, Impaired sensation, Decreased range of motion, Improper body mechanics, Impaired perceived functional ability, Decreased activity tolerance, Decreased knowledge of use of DME, Decreased safety awareness, Decreased strength, Impaired flexibility, Impaired UE functional use, Postural dysfunction  Visit Diagnosis: Muscle weakness (generalized)  Other lack of coordination  Central cord syndrome, sequela ([facility])  Abnormality of gait and mobility  Difficulty in walking, not elsewhere classified  Other abnormalities of gait and mobility  Unsteadiness on feet     Problem List Patient Active Problem List   Diagnosis Date Noted   Acute appendicitis with appendiceal abscess 05/30/2018   Hypocalcemia 02/15/2018   Dysuria 02/15/2018   Bilateral lower extremity edema 02/11/2018   Vaginal yeast infection 01/30/2018   Chest tightness 01/27/2018   Healthcare-associated pneumonia 01/25/2018   Pleural effusion, not elsewhere classified 01/25/2018   Acute deep vein thrombosis (DVT) of left lower extremity ([place]) 01/24/2018   Acute deep vein thrombosis (DVT) of right lower extremity ([place]) 01/24/2018   Chronic allergic rhinitis 01/24/2018   Depression with anxiety 01/24/2018   UTI due to Klebsiella species 01/24/2018   Protein-calorie malnutrition, severe ([place]) 01/24/2018   S/P insertion of IVC (inferior vena caval) filter 01/24/2018   Reactive depression    Hypokalemia  Leukocytosis    Essential hypertension    Trauma    Tetraparesis ([facility])    Neuropathic pain    Neurogenic bowel    Neurogenic bladder    Benign essential HTN    Acute blood loss anemia    Central cord syndrome at C6 level of cervical spinal cord ([place])  11/29/2017   Central cord syndrome ([place]) 11/29/2017    Lewis Moccasin, PT 07/21/2021, 3:56 PM  [place] MAIN [facility] SERVICES [address] [place], [place], [address] Phone: [phone]   Fax:  [phone]  Name: ELEONOR OCON MRN: [id number] Date of Birth: [birth year]

## 2021-07-22 ENCOUNTER — Ambulatory Visit: Payer: Medicare HMO

## 2021-07-22 ENCOUNTER — Encounter: Payer: Self-pay | Admitting: Occupational Therapy

## 2021-07-22 ENCOUNTER — Ambulatory Visit: Payer: Medicare HMO | Admitting: Occupational Therapy

## 2021-07-22 DIAGNOSIS — R2689 Other abnormalities of gait and mobility: Secondary | ICD-10-CM

## 2021-07-22 DIAGNOSIS — R269 Unspecified abnormalities of gait and mobility: Secondary | ICD-10-CM

## 2021-07-22 DIAGNOSIS — M6281 Muscle weakness (generalized): Secondary | ICD-10-CM

## 2021-07-22 DIAGNOSIS — S14129S Central cord syndrome at unspecified level of cervical spinal cord, sequela: Secondary | ICD-10-CM

## 2021-07-22 DIAGNOSIS — R2681 Unsteadiness on feet: Secondary | ICD-10-CM

## 2021-07-22 DIAGNOSIS — R262 Difficulty in walking, not elsewhere classified: Secondary | ICD-10-CM

## 2021-07-22 DIAGNOSIS — R278 Other lack of coordination: Secondary | ICD-10-CM

## 2021-07-22 NOTE — Therapy (Signed)
OUTPATIENT PHYSICAL THERAPY TREATMENT NOTE   Patient Name: Sherry [name] MRN: [id number] DOB:1934/07/12, 87 y.o., female Today's Date: 07/23/2021  PCP: Dr. Viviana Simpler REFERRING PROVIDER: Dr. Viviana Simpler   PT End of Session - 07/22/21 1153     Visit Number 154    Number of Visits 172    Date for PT Re-Evaluation 09/16/21    Authorization Type [employer] FOTO performed by PT on eval (7/20), score 12, Progress note on 03/10/2020; PN on 08/20/2020    Authorization Time Period Recert 07/09/1273- 1/70/0174; PN on 10/05/4965; Recert 5/91/6384- 66/59/9357; PN on 0/17/7939; Recert 03/00/9233- 0/08/6224; Recert 3/33/5456-2/56/3893; Recert 7/34/2876- 09/11/5724    Progress Note Due on Visit 160    PT Start Time 1146    PT Stop Time 1228    PT Time Calculation (min) 42 min    Equipment Utilized During Treatment Gait belt    Activity Tolerance Patient tolerated treatment well;Patient limited by fatigue    Behavior During Therapy WFL for tasks assessed/performed             Past Medical History:  Diagnosis Date   Acute blood loss anemia    Arthritis    Cancer ([place])    skin   Central cord syndrome at C6 level of cervical spinal cord ([place]) 11/29/2017   Hypertension    Protein-calorie malnutrition, severe ([place]) 01/24/2018   S/P insertion of IVC (inferior vena caval) filter 01/24/2018   Tetraparesis ([place])    Past Surgical History:  Procedure Laterality Date   ANTERIOR CERVICAL DECOMP/DISCECTOMY FUSION N/A 11/29/2017   Procedure: Cervical five-six, six-seven Anterior Cervical Decompression Fusion;  Surgeon: Judith Part, MD;  Location: [facility];  Service: Neurosurgery;  Laterality: N/A;  Cervical five-six, six-seven Anterior Cervical Decompression Fusion   CATARACT EXTRACTION     IR IVC FILTER PLMT / S&I /IMG GUID/MOD SED  12/08/2017   Patient Active Problem List   Diagnosis Date Noted   Acute appendicitis with appendiceal abscess 05/30/2018   Hypocalcemia 02/15/2018    Dysuria 02/15/2018   Bilateral lower extremity edema 02/11/2018   Vaginal yeast infection 01/30/2018   Chest tightness 01/27/2018   Healthcare-associated pneumonia 01/25/2018   Pleural effusion, not elsewhere classified 01/25/2018   Acute deep vein thrombosis (DVT) of left lower extremity ([place]) 01/24/2018   Acute deep vein thrombosis (DVT) of right lower extremity ([place]) 01/24/2018   Chronic allergic rhinitis 01/24/2018   Depression with anxiety 01/24/2018   UTI due to Klebsiella species 01/24/2018   Protein-calorie malnutrition, severe ([place]) 01/24/2018   S/P insertion of IVC (inferior vena caval) filter 01/24/2018   Reactive depression    Hypokalemia    Leukocytosis    Essential hypertension    Trauma    Tetraparesis ([facility])    Neuropathic pain    Neurogenic bowel    Neurogenic bladder    Benign essential HTN    Acute blood loss anemia    Central cord syndrome at C6 level of cervical spinal cord ([place]) 11/29/2017   Central cord syndrome ([place]) 11/29/2017    REFERRING DIAG: Central cord syndrome at C6 level of cervical spinal cord, sequela ([place])  THERAPY DIAG:  Muscle weakness (generalized)  Other lack of coordination  Central cord syndrome, sequela ([facility])  Abnormality of gait and mobility  Difficulty in walking, not elsewhere classified  Other abnormalities of gait and mobility  Unsteadiness on feet  Rationale for Evaluation and Treatment Rehabilitation  PERTINENT HISTORY: Pt is an 87 yo female that fell in 2019,  fractured vertebrae in her neck and in her low back per family. 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 After hospital stay, pt discharged to [facility] for ~1 month, experienced severe UTI as well as bilateral DVT (IVC filter placed). Discharged to [facility], stayed for about a year, and then moved to [facility] in April 2021. Was receiving PT, but per family facility reported that did not have adequate equipment to maximize PT for her. Pt until  about 1 month ago was practicing sit to stand transfers with 1-2 people, and working on static standing. Has a brace for L foot due to PF. Pt currently needs assistance with all ADLs (able to assist with donning/doffing her shirt), bed baths, and needs a hoyer lift for transfers. Able to drive power wheelchair without assistance.   PRECAUTIONS: Fall  SUBJECTIVE: Patient reports feeling pretty good day and agreeable to standing in // bars to focus on weight bearing and weight shifting.   PAIN:  Are you having pain? No     TODAY'S TREATMENT:  07/22/2021  Therapeutic activities:  Sit to stand in //bars with mod assist - x 6 trials- Patient with improved ability to bend legs back for better positioning to power up. Much improved ability to swing hips forward over toes with VC/TC and reps.   Static standing in // bars -focusing on alignment- patient able to use side mirror as feedback to adjust her posture and anterior weight shift and pull hips in/shoulder back. Patient performed 4 trials ranging from 45 sec to 2 min 23 sec.   -Dynamic weight shifting - lateral in standing- min assist at trunk and left knee blocked by PT's knees- x 20 reps x 2 trials.   - Attempted step forward with right LE - Mod assist to lateral wt. Shift and left knee blocked. Patient was successful in taking a step on right (FWB) yet was unable to pull leg back on her own to starting position x 3 trials. Attempted step forward with left LE yet difficult due to right knee buckling/weakness.   Education provided throughout session via VC/TC and demonstration to facilitate movement at target joints and correct muscle activation for all testing and exercises performed.      PATIENT EDUCATION: Education details: Standing posture Person educated: Patient Education method: Explanation, Demonstration, Tactile cues, and Verbal cues Education comprehension: verbalized understanding, returned demonstration, verbal cues  required, tactile cues required, and needs further education   HOME EXERCISE PROGRAM: No updates this session   PT Short Term Goals -       PT SHORT TERM GOAL #1   Title The patient will perform initial HEP with minimum assistance in order to improve strength and function.    Baseline Patient demonstrating independence with initial HEP as of 03/10/2020 with no questions or difficulty.    Time 4    Period Weeks    Status Achieved    Target Date 02/11/20              PT Long Term Goals -       PT LONG TERM GOAL #1   Title The patient will be compliant with finalized HEP with minimum assistance in preparation for self management and maintenance of condition.    Baseline 03/10/2020- Patient familiar with initial HEP and will keep goal active as HEP becomes more progressive including possible transfers and standing activities. 04/07/2020- Patient continues to report compliance with LE strengthening home exercises without questions or concerns. 04/21/2020- Patient  able to verbalize and demonstrate good understanding of current HEP including some supine and seated LE strengthening exercises. Reviewed and patient performed 10 reps today with minimal cueing. Will keep goal active to progress HEP as appropriate. 05/28/2020- patient reports continues to perform LE strengthening as able and some help from caregiver as able. No changes at this time to home program as patient is limited to supine/seated therex. 07/02/2020- Patient continues to report compliance with current supine and seated LE strengtheing home program and states no questions or concerns regarding her plan.    Time 12    Period Weeks    Status Achieved      PT LONG TERM GOAL #2   Title The patient will demonstrate at least 10 point improvement on FOTO score indicating an improved ability to perform functional activities.    Baseline on eval (/720) score 12; 10/22/19: 12; 12/10/19: 18, 12/13: 18: 04/07/2020- Will assess next visit.  07/02/2020- Will obtain next visit. 09/24/2020= Will obtain next visit. 11/12/2020= 12; 12/22/2020= 12; 03/30/2021= 17; 06/24/2021= Will assess next visit    Time 12    Period Weeks    Status On-going    Target Date 09/16/21      PT LONG TERM GOAL #3   Title The patient will demonstrate lateral scooting  for assistance with transfers with minA to maximize independence.    Baseline 10/22/19: Pt requires modA+1 for lateral scooting; 12/10/19: modA+1, 12/13: modA-maxA, 12/27: modA-maxA, 03/10/2020- ModA-MaxA- increased verbal cues and visual demonstration. 04/07/2020- Continued Max assist to perform forward/lateral scoot. 04/21/2020- Patient was able to demo slight lateral scoot with CGA approx 12 in then fatigued requiring max assist- she is able to scoot forward by leaning without significant issues. 05/28/2020- While sitting in chair- patient able to perform forward and lateral scoot with VC/visual demo and increased time allowed- CGA today but not consistent yet- will keep goal active at this time to continue to focus on strengthening and improving technique. 07/02/2020- Patient able to demonstrate minimal ability to laterally shift weigh on mat table requiring max assist yet is able to forward scoot out to edge of mat table with min Assist. 08/20/2020 - Patient was able to demonstrate minimal lateral scooting at edge of mat while holding feet still so they did not slip on tile surface. She continues to be able to demonstrate scoot forward and uses tilt option to recline to position safely back into seat of power wheelchair. 09/25/2020- Patient able to sit on mat and minimally scoot laterally but requires min/mod A for efficient scooting. Will end goal at this time due to plateau in progress with scooting over extended time.    Time 12    Period Weeks    Status Not Met      PT LONG TERM GOAL #4   Title The patient will demonstrate a squat pivot transfer with minimum assistance to maximize independence and mobility.     Baseline 10/22/19: unable to safely attempt at this time; 12/10/19: unable to safely attempt at this time, 01/14/20: unable to safety attempt at this time. 03/10/2020- Patient able to perform Stand pivot transfer with Maximal assist. 04/07/2020- Patient continues to require Max assist with sit to stand- will assist SPT next visit. 04/21/2020- Patient presents with Max assist to perform Sit to stand Transfer- unable to pivot or move Left LE well without difficulty. 07/02/2020= Max assist with SPT and minimal ability to pivot. 7/202/2022-Patient able to stand with minimal assist this visit (as good as CGA  last visit) and able to move right foot to pivot and performing with moderate assist today. 09/24/2020- Patient able to perform sit to stand with Min assist from lift chair. Patient has demo inconsistency and will keep goal active.  12/17/2020- Goal is currently not appropriate as patient fractured her ankle and currently NWB. 12/22/2020=Goal is currently not appropriate as patient fractured her ankle and currently NWB.  03/30/2021=Patient was able to demo min assist to stand holding onto PT back of arms yet continues to require max assist to pivot as she was unable to pivot or turn her legs consistently today. 06/24/2021=Able to stand with varying ability (anywhere from min/mod/max A) - last visit able to stand pivot transfer and move right LE as previously unable. 07/06/2021- Patient continues to stand with mod assist and able to move Right LE to pivot to right from w/c to mat table.    Time 12    Period Weeks    Status On-going    Target Date 09/16/21      PT LONG TERM GOAL #5   Title The patient will demonstrate sitting without UE support for 2-5 minutes at EOB to improve participation and maximize independence with ADLs.    Baseline 10/22/19: Pt able to sit unsupported for at least 2 minutes at edge of bed. 04/21/2020- Patient continues to demo good sitting at edge of mat > 5 miin without difficulty or back  support.    Time 12    Period Weeks    Status Achieved      PT LONG TERM GOAL #6   Title Patient will demonstrate improved functional LE strength as seen by consistent ability to stand > 2 min (3 out of 4 trials) with Mod/Max A for improved LE strength with transfers.    Baseline 05/28/2020- Patient able to inconsistent stand 1-2 min right now in //bars with Mod/Max A. 07/02/2020- Patient was able to progress last 2 visit to standing using bilateral platform attachment between 1-2 min which is a progression from the parallel bars. 08/20/2020- Patient performed static standing with min- mod A using bilateral platform attachment on walker for 6 min 30 sec today with assist for trunk control and intermittent verbal cues for posture. 09/24/2020- Patient has consistently been able to stand >2 min in // bars and when using bilateral Platform walker. 12/23/2020= Will reopen this goal as patient should transition back to weightbearing and again practice standing in new cert. 03/30/2021=1) 1 min 23 sec   2)  81mn 40 sec  3) 1 min 50 sec   Left LE hyperextended with increased difficulty tucking/extending hips today. Requiring more mod assist to remain standing. 06/24/2021=Patient was able to stand 5 trials and able to stand > 2 min for 1 of the 5 trials. 1) 1 min 40 sec 2) 2 min 38 sec 3) 38 sec (difficulty adjusting in standing and reported left LE discomfort 4) 1 min 28 sec 5) 1 min 35 sec  -rest break between each trial. However- Patient did demonstrate less overall UE support and more erect posture with ability to maintain extended hips over feet as previously much more difficult to maintain.    Time 12    Period Weeks    Status On-going    Target Date 09/15/21      PT LONG TERM GOAL #7   Title Patient/caregiver will demonstrate assisted transfer using [place] Lift consistently Independently for improved ability to transfer at home from lift chair to bed.    Baseline  09/24/2020- Patient requires hoyer lift  (Dependent) for safe transfers.  12/17/2020- Patient currently still dependent on hoyer as she is currently non-weight bearing due to recent ankle fracture. 03/30/2021- Patient stated she has not tried. Agreeable to attempt in near future. 06/24/2021= Patient continues to rely on hoyer transfers at home.    Time 12    Period Weeks    Status Deferred    Target Date 06/22/21      PT LONG TERM GOAL #8   Title Patient will demo improved right LE quad strength as seen by ability to extend knee to 20 deg from 0 or less while performing a Long arc quad (for improved terminal knee ext and more full ROM)  for improved ability to bearing weight with Right LE.    Baseline 12/22/2020=able to extend knee to approx 35 deg from zero during long arc quad; 2/27/203- WIll reassess next visit; 5/232023=23 deg from zero    Time 12    Period Weeks    Status On-going    Target Date 09/16/21      PT LONG TERM GOAL  #9   TITLE Patient will ambulate > 100 feet using LiteGait exhibiting reciprocal steps to prepare for more weighted ambulation in future.    Baseline 06/24/2021- Patient recently on 06/22/2021 ambulated approx 75 feet with litegait - but visits before only approx 35 feet. 07/06/2021- Patient able to ambulate approx 50 feet today using litegait and 2 person assist.    Time 12    Period Weeks    Status On-going    Target Date 09/16/21              Plan - 07/22/21 1154     Clinical Impression Statement Patient performed well today with focus on weight bearing exercises/standing. She was able to stand well today- focused on good standing alignment with improving ability to ant weight shift today. She was also able to latera weight shift well today and actually able to take a forward step for first time today. Patient will benefit from skilled Physical Therapy services to assist in improving functional mobility and strength to improve patient's quality of life.    Personal Factors and Comorbidities Age;Time  since onset of injury/illness/exacerbation;Comorbidity 3+;Fitness    Comorbidities HTN, quadriparesis, history of DVT, neurogenic bladder    Examination-Activity Limitations Bathing;Hygiene/Grooming;Squat;Bed Mobility;Lift;Bend;Stand;Engineer, manufacturing;Toileting;Self Feeding;Transfers;Continence;Sit;Dressing;Sleep;Carry    Examination-Participation Restrictions Church;Laundry;Cleaning;Medication Management;Community Activity;Meal Prep;Interpersonal Relationship    Stability/Clinical Decision Making Evolving/Moderate complexity    Rehab Potential Fair    PT Frequency 2x / week    PT Duration 12 weeks    PT Treatment/Interventions ADLs/Self Care Home Management;Electrical Stimulation;Therapeutic activities;Wheelchair mobility training;Vasopneumatic Device;Joint Manipulations;Vestibular;Passive range of motion;Patient/family education;Therapeutic exercise;DME Instruction;Biofeedback;Aquatic Therapy;Moist Heat;Gait training;Balance training;Orthotic Fit/Training;Dry needling;Energy conservation;Taping;Splinting;Neuromuscular re-education;Cryotherapy;Ultrasound;Functional mobility training    PT Next Visit Plan Continue with progressive standing/transfer training; Progress LE Strengthening. Gait training with lite gait. Obtain latest FOTO next visit    PT Home Exercise Plan no changes    Consulted and Agree with Plan of Care Patient               Lewis Moccasin, PT 07/23/2021, 8:46 AM

## 2021-07-22 NOTE — Therapy (Signed)
OUTPATIENT OCCUPATIONAL THERAPY TREATMENT NOTE   Patient Name: Sherry [name] MRN: [id number] DOB:05/26/1934, 87 y.o., female Today's Date: 07/22/2021  PCP: Dr. Richard Letvak REFERRING PROVIDER: Dr. Richard Letvak   OT End of Session - 07/22/21 1734     Visit Number [id number]    Number of Visits 154    Date for OT Re-Evaluation 09/28/21    Authorization Time Period Progress report period starting 06/01/2021    OT Start Time 1045    OT Stop Time 1130    OT Time Calculation (min) 45 min    Equipment Utilized During Treatment moist heat    Activity Tolerance Patient tolerated treatment well    Behavior During Therapy WFL for tasks assessed/performed             Past Medical History:  Diagnosis Date   Acute blood loss anemia    Arthritis    Cancer (HCC)    skin   Central cord syndrome at C6 level of cervical spinal cord (HCC) 11/29/2017   Hypertension    Protein-calorie malnutrition, severe (HCC) 01/24/2018   S/P insertion of IVC (inferior vena caval) filter 01/24/2018   Tetraparesis (HCC)    Past Surgical History:  Procedure Laterality Date   ANTERIOR CERVICAL DECOMP/DISCECTOMY FUSION N/A 11/29/2017   Procedure: Cervical five-six, six-seven Anterior Cervical Decompression Fusion;  Surgeon: Ostergard, Thomas A, MD;  Location: [facility] OR;  Service: Neurosurgery;  Laterality: N/A;  Cervical five-six, six-seven Anterior Cervical Decompression Fusion   CATARACT EXTRACTION     IR IVC FILTER PLMT / S&I /IMG GUID/MOD SED  12/08/2017   Patient Active Problem List   Diagnosis Date Noted   Acute appendicitis with appendiceal abscess 05/30/2018   Hypocalcemia 02/15/2018   Dysuria 02/15/2018   Bilateral lower extremity edema 02/11/2018   Vaginal yeast infection 01/30/2018   Chest tightness 01/27/2018   Healthcare-associated pneumonia 01/25/2018   Pleural effusion, not elsewhere classified 01/25/2018   Acute deep vein thrombosis (DVT) of left lower extremity (HCC) 01/24/2018   Acute  deep vein thrombosis (DVT) of right lower extremity (HCC) 01/24/2018   Chronic allergic rhinitis 01/24/2018   Depression with anxiety 01/24/2018   UTI due to Klebsiella species 01/24/2018   Protein-calorie malnutrition, severe (HCC) 01/24/2018   S/P insertion of IVC (inferior vena caval) filter 01/24/2018   Reactive depression    Hypokalemia    Leukocytosis    Essential hypertension    Trauma    Tetraparesis (HCC)    Neuropathic pain    Neurogenic bowel    Neurogenic bladder    Benign essential HTN    Acute blood loss anemia    Central cord syndrome at C6 level of cervical spinal cord (HCC) 11/29/2017   Central cord syndrome (HCC) 11/29/2017      REFERRING DIAG: Central Cord Syndrome at C6 of the Cervical Spinal Cord  THERAPY DIAG:  Muscle weakness (generalized)  Rationale for Evaluation and Treatment Rehabilitation  PERTINENT HISTORY: Patient s/p fall November 29, 2017 resulting in diagnosis of central cord syndrome at C 6 level.  She has had therapy in multiple venues but with recent move to [place], therapy staff recommended she seek outpatient therapy for her needs  PRECAUTIONS: None  SUBJECTIVE: Pt. Reports doing well today.  PAIN:  Are you having pain? No     OBJECTIVE:   TODAY'S TREATMENT:       Therapeutic Ex.   Pt. tolerated PROM followed by AROM bilateral wrist extension, PROM for bilateral digit MP, PIP, and   DIP flexion, and extension, thumb radial, and palmar abduction in conjunction with moist heat. Emphasis was placed on left wrist extension, bilateral thumb radial, and palmar abduction, and bilateral digit MP, PIP, and DIP flexion. Pt. Worked on BUE strengthening, and reciprocal motion using the UBE while seated for 8 min. with no resistance. Constant monitoring was provided.    Manual Therapy:   Pt. tolerated soft tissue massage to the volar, and dorsal aspect of the bilateral 2nd through 5th digits for stiffness independent of, and in  preparation for therapeutic Ex.    Pt. continues to present with bilateral wrist, and digit tightness, and continues to work towards improving with reaching across midline. Pt. tolerated manual therapy well. Pt. continues to tolerate the UBE without difficulty with increasing resistance. Pt. tolerated ROM well without reports of pain, or discomfort following moist heat modality. Pt. presents with tightness at the bilateral thumb webspace. Pt. continues to present with bilateral wrist flexor tightness, and digit MP, PIP, and DIP extensor tightness. Pt. continues to benefit from working on impoving ROM in order to work towards increasing engagement of her bilateral hands during ADLs, and IADL tasks.          PATIENT EDUCATION: Education details: BUE ROM, hand function, UE functioning Person educated: Patient Education method: Customer service manager Education comprehension: verbalized understanding, returned demonstration, and needs further education   HOME EXERCISE PROGRAM Continue with ongoing HEPs     OT Long Term Goals - 07/06/21 1106       OT LONG TERM GOAL #1   Title Patient and caregiver will demonstrate understanding of home exercise program for ROM.    Baseline 06/01/2021: Pt. now has new restorative aides who are assiting with daily ROM at [facility] now. Pt. has recently been in quarantine for COVID-19. Pt.'s current restorative aide is retiring at the end of December. Pt. continues to have a restorative aide rehab aide assist her wih exercises at [facility]. 8/8: Independently completes/directs HEP mulitple times/week. 12/17/2020: Pt. has a new restorative aide who will be resuming ROM program.    Time 12    Period Weeks    Status Achieved      OT LONG TERM GOAL #2   Title Patient will demonstrate ability to don shirt with min assist from seated position.    Baseline Pt. is indepedent with a larger shirt, and continues to require increased time to complete,    Time 12     Period Weeks    Status Achieved      OT LONG TERM GOAL #4   Title Patient will demonstrate improved composite finger flexion to hold deodorant to apply to underarms.    Baseline 01/12/2021:  Pt. is progressing, and is able to reach to grasp, and hold narrow cones. 10/20/2020: Pt. conitnues to make steady progress with bilateral digit flexion.Pt. continues to present with stiifness/tightness which is hindering pt.'s ability to formulate a full composite fist. Pt. continues to present with improving finger flexion of R hand for a partial gross grasp, but unable to grasp and hold items. pt. conitnues to present with limited digit flexion of L hand. Pt. has active left thumb abduction. 8/8: holds cup with 2 hands, continues to have limited composite finger flexion L worse than R. 02/16/2021: pt. continues to present with limited bilateral gross composite fisting. 04/08/2021: pt. continues to present with limited digit flexion affecting her ability to formulate bilateral composite fisting. 110th visivt: Pt. is able to hold the utensils  in preparation for self-feeding. 06/01/2021: continues to have difficulty formulating a fist, however has improved with self-feeding, and is now using standard utensils.07/06/2021: Pt. continues to have difficulty holding deoderant, and use deoderant. pt. is improving with holding similiar sized objects.    Time 12    Period Weeks    Status On-going    Target Date 09/28/21      Long Term Additional Goals   Additional Long Term Goals Yes      OT LONG TERM GOAL #6   Title Patient will increase right UE ROM to comb the back of her hair with modified independence.    Baseline 01/12/2022: Pt. is able to comb her hair, reaching to the back of her hair.. 10/20/2020: Pt. continues to progress wtith RRUE ROM, and is able to use a pic for the back of her head, however is unable to brush the back of her hair. 07/30/2020: Pt. is now able to comb the back of her hair with a pic, however  continues to work on completing the task efficiently. 8/8: Achieves with great effort, has assistance for efficiancy. 02/16/2021: Pt. is now able to reach, and brush the back of her hair. 04/08/2021: pt. continues to improve reach towards the back of her hair. 110th visit: Pt. is to reach towards the back of her hair, however has difficulty sustaining her arms in elevaton for the entire duration of perfroming hair cre thoroughly. 06/01/2021: Pt. is able to reach to the back of her head, however has difficutly with thoroughness. 07/06/2021: Pt. continues to improve with reaching to the back of her hair, however has difficulty with thoroughly perfroming the task in the back of her hair.    Time 12    Period Weeks    Status Partially Met    Target Date 09/28/21      OT LONG TERM GOAL #8   Title Pt. will write, and sign her name with 100% legibility, and modified independence.    Baseline 01/12/2021: Pt. continues to fill out her daily menu and complete puzzles. 10/20/2020: Pt. continues to make progress overall. Pt. continues to consistently fill out her daily menu, and writing for crossword puzzles. Pt. is able to maintain grasp on a wide width pen. Contineus to work on writing legibility. 8/8: writes daily, continues to work on improving speed. 02/16/2021: Pt. continues to present wiht limited writing legibility, fluctuating daily. 04/08/2021: Pt. continues to to be able to fill out her daily menu.limited writing legibility for daily puzzles.110th visit: Pt. continues to present with limited legibility, and efficiency for completing daily puzzles. 06/01/2021: pt. continues to work on the consistency with legibility 07/06/2021: Pt. continues to work on improving consistency with legibility.    Time 12    Period Weeks    Status On-going    Target Date 09/28/21      OT LONG TERM GOAL  #9   TITLE Pt. will turn pages in a book with modified independence.    Baseline 07/06/2021: Pt. has difficulty turning pages.     Time 12    Period Weeks    Status New    Target Date 07/06/21              Plan - 07/22/21 1737     Clinical Impression Statement Pt. continues to present with bilateral wrist, and digit tightness, and continues to work towards improving with reaching across midline. Pt. tolerated manual therapy well. Pt. continues to tolerate the UBE without difficulty with increasing   resistance. Pt. tolerated ROM well without reports of pain, or discomfort following moist heat modality. Pt. presents with tightness at the bilateral thumb webspace. Pt. continues to present with bilateral wrist flexor tightness, and digit MP, PIP, and DIP extensor tightness. Pt. continues to benefit from working on impoving ROM in order to work towards increasing engagement of her bilateral hands during ADLs, and IADL tasks.      OT Occupational Profile and History Detailed Assessment- Review of Records and additional review of physical, cognitive, psychosocial history related to current functional performance    Occupational performance deficits (Please refer to evaluation for details): ADL's;IADL's;Leisure;Social Participation    Body Structure / Function / Physical Skills ADL;Continence;Dexterity;Flexibility;Strength;ROM;Balance;Coordination;FMC;IADL;Endurance;UE functional use;Decreased knowledge of use of DME;GMC    Psychosocial Skills Coping Strategies    Rehab Potential Fair    Clinical Decision Making Several treatment options, min-mod task modification necessary    Comorbidities Affecting Occupational Performance: Presence of comorbidities impacting occupational performance    Comorbidities impacting occupational performance description: contractures of bilateral hands, dependent transfers,    Modification or Assistance to Complete Evaluation  No modification of tasks or assist necessary to complete eval    OT Frequency 2x / week    OT Duration 12 weeks    OT Treatment/Interventions Self-care/ADL  training;Cryotherapy;Therapeutic exercise;DME and/or AE instruction;Balance training;Neuromuscular education;Manual Therapy;Splinting;Moist Heat;Passive range of motion;Therapeutic activities;Patient/family education    Plan continue to progress ROM of digits, wrists and shoulders as it pertains to completion of ADL tasks that pt values.    Consulted and Agree with Plan of Care Patient            Elaine Jagentenfl, MS, OTR/L  Elaine Jagentenfl, OT 07/22/2021, 5:39 PM       

## 2021-07-27 ENCOUNTER — Ambulatory Visit: Payer: Medicare HMO

## 2021-07-27 ENCOUNTER — Ambulatory Visit: Payer: Medicare HMO | Admitting: Occupational Therapy

## 2021-07-29 ENCOUNTER — Encounter: Payer: Self-pay | Admitting: Occupational Therapy

## 2021-07-29 ENCOUNTER — Ambulatory Visit: Payer: Medicare HMO | Admitting: Occupational Therapy

## 2021-07-29 ENCOUNTER — Ambulatory Visit: Payer: Medicare HMO

## 2021-07-29 DIAGNOSIS — M6281 Muscle weakness (generalized): Secondary | ICD-10-CM

## 2021-07-29 DIAGNOSIS — R278 Other lack of coordination: Secondary | ICD-10-CM

## 2021-07-29 DIAGNOSIS — S14129S Central cord syndrome at unspecified level of cervical spinal cord, sequela: Secondary | ICD-10-CM

## 2021-07-29 DIAGNOSIS — R2689 Other abnormalities of gait and mobility: Secondary | ICD-10-CM

## 2021-07-29 DIAGNOSIS — R262 Difficulty in walking, not elsewhere classified: Secondary | ICD-10-CM

## 2021-07-29 DIAGNOSIS — R2681 Unsteadiness on feet: Secondary | ICD-10-CM

## 2021-07-29 DIAGNOSIS — R269 Unspecified abnormalities of gait and mobility: Secondary | ICD-10-CM

## 2021-07-29 NOTE — Therapy (Addendum)
OUTPATIENT OCCUPATIONAL THERAPY TREATMENT NOTE   Patient Name: Sherry [name] MRN: [id number] DOB:September 28, 1934, 87 y.o., female Today's Date: 07/29/2021  PCP: Dr. Viviana Simpler REFERRING PROVIDER: Dr. Viviana Simpler   OT End of Session - 07/29/21 1228     Visit Number [id number]    Number of Visits 154    Date for OT Re-Evaluation 09/28/21    Authorization Time Period Progress report period starting 06/01/2021    OT Start Time 69    OT Stop Time 1145    OT Time Calculation (min) 45 min    Equipment Utilized During Treatment moist heat    Activity Tolerance Patient tolerated treatment well    Behavior During Therapy WFL for tasks assessed/performed             Past Medical History:  Diagnosis Date   Acute blood loss anemia    Arthritis    Cancer ([place])    skin   Central cord syndrome at C6 level of cervical spinal cord ([place]) 11/29/2017   Hypertension    Protein-calorie malnutrition, severe ([place]) 01/24/2018   S/P insertion of IVC (inferior vena caval) filter 01/24/2018   Tetraparesis ([place])    Past Surgical History:  Procedure Laterality Date   ANTERIOR CERVICAL DECOMP/DISCECTOMY FUSION N/A 11/29/2017   Procedure: Cervical five-six, six-seven Anterior Cervical Decompression Fusion;  Surgeon: Judith Part, MD;  Location: [place];  Service: Neurosurgery;  Laterality: N/A;  Cervical five-six, six-seven Anterior Cervical Decompression Fusion   CATARACT EXTRACTION     IR IVC FILTER PLMT / S&I /IMG GUID/MOD SED  12/08/2017   Patient Active Problem List   Diagnosis Date Noted   Acute appendicitis with appendiceal abscess 05/30/2018   Hypocalcemia 02/15/2018   Dysuria 02/15/2018   Bilateral lower extremity edema 02/11/2018   Vaginal yeast infection 01/30/2018   Chest tightness 01/27/2018   Healthcare-associated pneumonia 01/25/2018   Pleural effusion, not elsewhere classified 01/25/2018   Acute deep vein thrombosis (DVT) of left lower extremity ([place]) 01/24/2018   Acute  deep vein thrombosis (DVT) of right lower extremity ([place]) 01/24/2018   Chronic allergic rhinitis 01/24/2018   Depression with anxiety 01/24/2018   UTI due to Klebsiella species 01/24/2018   Protein-calorie malnutrition, severe ([place]) 01/24/2018   S/P insertion of IVC (inferior vena caval) filter 01/24/2018   Reactive depression    Hypokalemia    Leukocytosis    Essential hypertension    Trauma    Tetraparesis ([facility])    Neuropathic pain    Neurogenic bowel    Neurogenic bladder    Benign essential HTN    Acute blood loss anemia    Central cord syndrome at C6 level of cervical spinal cord ([place]) 11/29/2017   Central cord syndrome ([place]) 11/29/2017      REFERRING DIAG: Central Cord Syndrome at C6 of the Cervical Spinal Cord  THERAPY DIAG:  Muscle weakness (generalized)  Rationale for Evaluation and Treatment Rehabilitation  PERTINENT HISTORY: Patient s/p fall November 29, 2017 resulting in diagnosis of central cord syndrome at C 6 level.  She has had therapy in multiple venues but with recent move to [facility], therapy staff recommended she seek outpatient therapy for her needs  PRECAUTIONS: None  SUBJECTIVE:  pt. Reports that she is feeling much better today.  PAIN:  Are you having pain? No     OBJECTIVE:   TODAY'S TREATMENT:       Therapeutic Ex.   Pt. tolerated PROM followed by AROM bilateral wrist extension, PROM for  bilateral digit MP, PIP, and DIP flexion, and extension, thumb radial, and palmar abduction in conjunction with moist heat. Emphasis was placed on left wrist extension, bilateral thumb radial, and palmar abduction, and bilateral digit MP, PIP, and DIP flexion. Pt. Worked on BUE strengthening, and reciprocal motion using the UBE while seated for 8 min. with no resistance. Constant monitoring was provided.    Manual Therapy:   Pt. tolerated soft tissue massage to the volar, and dorsal aspect of the bilateral 2nd through 5th digits for stiffness independent  of, and in preparation for therapeutic Ex.    Pt. had a biopsy performed on the dorsal aspect of the right hand. Moist head placement was modified to avoid the dorsal of the right hand, and was placed distally at the digits only. Pt. continues to tolerate the UBE without difficulty with increasing resistance. Pt. tolerated ROM well without reports of pain, or discomfort following moist heat modality. Pt. presents with tightness at the bilateral thumb webspace. Pt. continues to present with bilateral wrist flexor tightness, and digit MP, PIP, and DIP extensor tightness. Pt. continues to benefit from working on impoving ROM in order to work towards increasing engagement of her bilateral hands during ADLs, and IADL tasks.          PATIENT EDUCATION: Education details: BUE ROM, hand function, UE functioning Person educated: Patient Education method: Customer service manager Education comprehension: verbalized understanding, returned demonstration, and needs further education   HOME EXERCISE PROGRAM Continue with ongoing HEPs     OT Long Term Goals - 07/06/21 1106       OT LONG TERM GOAL #1   Title Patient and caregiver will demonstrate understanding of home exercise program for ROM.    Baseline 06/01/2021: Pt. now has new restorative aides who are assiting with daily ROM at [facility] now. Pt. has recently been in quarantine for COVID-19. Pt.'s current restorative aide is retiring at the end of December. Pt. continues to have a restorative aide rehab aide assist her wih exercises at [facility]. 8/8: Independently completes/directs HEP mulitple times/week. 12/17/2020: Pt. has a new restorative aide who will be resuming ROM program.    Time 12    Period Weeks    Status Achieved      OT LONG TERM GOAL #2   Title Patient will demonstrate ability to don shirt with min assist from seated position.    Baseline Pt. is indepedent with a larger shirt, and continues to require increased time to  complete,    Time 12    Period Weeks    Status Achieved      OT LONG TERM GOAL #4   Title Patient will demonstrate improved composite finger flexion to hold deodorant to apply to underarms.    Baseline 01/12/2021:  Pt. is progressing, and is able to reach to grasp, and hold narrow cones. 10/20/2020: Pt. conitnues to make steady progress with bilateral digit flexion.Pt. continues to present with stiifness/tightness which is hindering pt.'s ability to formulate a full composite fist. Pt. continues to present with improving finger flexion of R hand for a partial gross grasp, but unable to grasp and hold items. pt. conitnues to present with limited digit flexion of L hand. Pt. has active left thumb abduction. 8/8: holds cup with 2 hands, continues to have limited composite finger flexion L worse than R. 02/16/2021: pt. continues to present with limited bilateral gross composite fisting. 04/08/2021: pt. continues to present with limited digit flexion affecting her ability  to formulate bilateral composite fisting. 110th visivt: Pt. is able to hold the utensils in preparation for self-feeding. 06/01/2021: continues to have difficulty formulating a fist, however has improved with self-feeding, and is now using standard utensils.07/06/2021: Pt. continues to have difficulty holding deoderant, and use deoderant. pt. is improving with holding similiar sized objects.    Time 12    Period Weeks    Status On-going    Target Date 09/28/21      Long Term Additional Goals   Additional Long Term Goals Yes      OT LONG TERM GOAL #6   Title Patient will increase right UE ROM to comb the back of her hair with modified independence.    Baseline 01/12/2022: Pt. is able to comb her hair, reaching to the back of her hair.. 10/20/2020: Pt. continues to progress wtith RRUE ROM, and is able to use a pic for the back of her head, however is unable to brush the back of her hair. 07/30/2020: Pt. is now able to comb the back of her hair  with a pic, however continues to work on completing the task efficiently. 8/8: Achieves with great effort, has assistance for efficiancy. 02/16/2021: Pt. is now able to reach, and brush the back of her hair. 04/08/2021: pt. continues to improve reach towards the back of her hair. 110th visit: Pt. is to reach towards the back of her hair, however has difficulty sustaining her arms in elevaton for the entire duration of perfroming hair cre thoroughly. 06/01/2021: Pt. is able to reach to the back of her head, however has difficutly with thoroughness. 07/06/2021: Pt. continues to improve with reaching to the back of her hair, however has difficulty with thoroughly perfroming the task in the back of her hair.    Time 12    Period Weeks    Status Partially Met    Target Date 09/28/21      OT LONG TERM GOAL #8   Title Pt. will write, and sign her name with 100% legibility, and modified independence.    Baseline 01/12/2021: Pt. continues to fill out her daily menu and complete puzzles. 10/20/2020: Pt. continues to make progress overall. Pt. continues to consistently fill out her daily menu, and writing for crossword puzzles. Pt. is able to maintain grasp on a wide width pen. Contineus to work on Media planner. 8/8: writes daily, continues to work on improving speed. 02/16/2021: Pt. continues to present wiht limited writing legibility, fluctuating daily. 04/08/2021: Pt. continues to to be able to fill out her daily menu.limited writing legibility for daily puzzles.110th visit: Pt. continues to present with limited legibility, and efficiency for completing daily puzzles. 06/01/2021: pt. continues to work on the consistency with legibility 07/06/2021: Pt. continues to work on improving consistency with legibility.    Time 12    Period Weeks    Status On-going    Target Date 09/28/21      OT LONG TERM GOAL  #9   TITLE Pt. will turn pages in a book with modified independence.    Baseline 07/06/2021: Pt. has difficulty  turning pages.    Time 12    Period Weeks    Status New    Target Date 07/06/21              Plan - 07/22/21 1737     Clinical Impression Statement Pt. had a biopsy performed on the dorsal aspect of the right hand. Moist head placement was modified to avoid the  dorsal of the right hand, and was placed distally at the digits only. Pt. continues to tolerate the UBE without difficulty with increasing resistance. Pt. tolerated ROM well without reports of pain, or discomfort following moist heat modality. Pt. presents with tightness at the bilateral thumb webspace. Pt. continues to present with bilateral wrist flexor tightness, and digit MP, PIP, and DIP extensor tightness. Pt. continues to benefit from working on impoving ROM in order to work towards increasing engagement of her bilateral hands during ADLs, and IADL tasks.          OT Occupational Profile and History Detailed Assessment- Review of Records and additional review of physical, cognitive, psychosocial history related to current functional performance    Occupational performance deficits (Please refer to evaluation for details): ADL's;IADL's;Leisure;Social Participation    Body Structure / Function / Physical Skills ADL;Continence;Dexterity;Flexibility;Strength;ROM;Balance;Coordination;FMC;IADL;Endurance;UE functional use;Decreased knowledge of use of DME;GMC    Psychosocial Skills Coping Strategies    Rehab Potential Fair    Clinical Decision Making Several treatment options, min-mod task modification necessary    Comorbidities Affecting Occupational Performance: Presence of comorbidities impacting occupational performance    Comorbidities impacting occupational performance description: contractures of bilateral hands, dependent transfers,    Modification or Assistance to Complete Evaluation  No modification of tasks or assist necessary to complete eval    OT Frequency 2x / week    OT Duration 12 weeks    OT  Treatment/Interventions Self-care/ADL training;Cryotherapy;Therapeutic exercise;DME and/or AE instruction;Balance training;Neuromuscular education;Manual Therapy;Splinting;Moist Heat;Passive range of motion;Therapeutic activities;Patient/family education    Plan continue to progress ROM of digits, wrists and shoulders as it pertains to completion of ADL tasks that pt values.    Consulted and Agree with Plan of Care Patient            Harrel Carina, MS, OTR/L  Harrel Carina, OT 07/29/2021, 12:30 PM

## 2021-07-30 NOTE — Therapy (Signed)
OUTPATIENT PHYSICAL THERAPY TREATMENT NOTE   Patient Name: Sherry [name] MRN: [id number] DOB:1934-11-13, 87 y.o., female Today's Date: 07/30/2021  PCP: Dr. Viviana Simpler REFERRING PROVIDER: Dr. Viviana Simpler   PT End of Session - 07/29/21 1155     Visit Number 155    Number of Visits 172    Date for PT Re-Evaluation 09/16/21    Authorization Type [employer] FOTO performed by PT on eval (7/20), score 12, Progress note on 03/10/2020; PN on 08/20/2020    Authorization Time Period Recert 05/02/9620- 2/97/9892; PN on 02/02/9415; Recert 05/10/1446- 18/56/3149; PN on 08/02/6376; Recert 58/85/0277- 05/13/8784; Recert 7/67/2094-08/09/6281; Recert 6/62/9476- 5/46/5035    Progress Note Due on Visit 160    PT Start Time 1149    PT Stop Time 1220    PT Time Calculation (min) 31 min    Equipment Utilized During Treatment Gait belt    Activity Tolerance Patient tolerated treatment well;Patient limited by fatigue    Behavior During Therapy WFL for tasks assessed/performed             Past Medical History:  Diagnosis Date   Acute blood loss anemia    Arthritis    Cancer ([place])    skin   Central cord syndrome at C6 level of cervical spinal cord ([place]) 11/29/2017   Hypertension    Protein-calorie malnutrition, severe ([place]) 01/24/2018   S/P insertion of IVC (inferior vena caval) filter 01/24/2018   Tetraparesis ([place])    Past Surgical History:  Procedure Laterality Date   ANTERIOR CERVICAL DECOMP/DISCECTOMY FUSION N/A 11/29/2017   Procedure: Cervical five-six, six-seven Anterior Cervical Decompression Fusion;  Surgeon: Judith Part, MD;  Location: [facility];  Service: Neurosurgery;  Laterality: N/A;  Cervical five-six, six-seven Anterior Cervical Decompression Fusion   CATARACT EXTRACTION     IR IVC FILTER PLMT / S&I /IMG GUID/MOD SED  12/08/2017   Patient Active Problem List   Diagnosis Date Noted   Acute appendicitis with appendiceal abscess 05/30/2018   Hypocalcemia 02/15/2018    Dysuria 02/15/2018   Bilateral lower extremity edema 02/11/2018   Vaginal yeast infection 01/30/2018   Chest tightness 01/27/2018   Healthcare-associated pneumonia 01/25/2018   Pleural effusion, not elsewhere classified 01/25/2018   Acute deep vein thrombosis (DVT) of left lower extremity ([place]) 01/24/2018   Acute deep vein thrombosis (DVT) of right lower extremity ([place]) 01/24/2018   Chronic allergic rhinitis 01/24/2018   Depression with anxiety 01/24/2018   UTI due to Klebsiella species 01/24/2018   Protein-calorie malnutrition, severe ([place]) 01/24/2018   S/P insertion of IVC (inferior vena caval) filter 01/24/2018   Reactive depression    Hypokalemia    Leukocytosis    Essential hypertension    Trauma    Tetraparesis ([facility])    Neuropathic pain    Neurogenic bowel    Neurogenic bladder    Benign essential HTN    Acute blood loss anemia    Central cord syndrome at C6 level of cervical spinal cord ([place]) 11/29/2017   Central cord syndrome ([place]) 11/29/2017    REFERRING DIAG: Central cord syndrome at C6 level of cervical spinal cord, sequela ([place])  THERAPY DIAG:  Muscle weakness (generalized)  Other lack of coordination  Central cord syndrome, sequela ([facility])  Abnormality of gait and mobility  Difficulty in walking, not elsewhere classified  Other abnormalities of gait and mobility  Unsteadiness on feet  Rationale for Evaluation and Treatment Rehabilitation  PERTINENT HISTORY: Pt is an 87 yo female that fell in 2019,  fractured vertebrae in her neck and in her low back per family. 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 After hospital stay, pt discharged to [facility] for ~1 month, experienced severe UTI as well as bilateral DVT (IVC filter placed). Discharged to [facility], stayed for about a year, and then moved to [facility] in April 2021. Was receiving PT, but per family facility reported that did not have adequate equipment to maximize PT for her. Pt until  about 1 month ago was practicing sit to stand transfers with 1-2 people, and working on static standing. Has a brace for L foot due to PF. Pt currently needs assistance with all ADLs (able to assist with donning/doffing her shirt), bed baths, and needs a hoyer lift for transfers. Able to drive power wheelchair without assistance.   PRECAUTIONS: Fall  SUBJECTIVE:   PAIN:  Are you having pain? No     TODAY'S TREATMENT:   Therapeutic activities:  Stand pivot transfer: Power w/c to [employer] assist and patient able to pivot/turn right LE - gait belt and patient holding onto PT  arms.   Therex:   Nustep - BUE/LE  with min assist to keep Left and Right LE into stirrups. Patient was able to grip handles well enough to use B arms and then performed 5 min without rest at L0 followed by 3 min rest- then another 5 min for a total of 10 min. Patient reported very excited to be able to use piece of equipment today.   Sit to stand x 3 from seat of nustep- min/mod assist focusing on powering up with B LE's.    Education provided throughout session via VC/TC and demonstration to facilitate movement at target joints and correct muscle activation for all testing and exercises performed.      PATIENT EDUCATION: Education details: Standing posture Person educated: Patient Education method: Explanation, Demonstration, Tactile cues, and Verbal cues Education comprehension: verbalized understanding, returned demonstration, verbal cues required, tactile cues required, and needs further education   HOME EXERCISE PROGRAM: No updates this session   PT Short Term Goals -       PT SHORT TERM GOAL #1   Title The patient will perform initial HEP with minimum assistance in order to improve strength and function.    Baseline Patient demonstrating independence with initial HEP as of 03/10/2020 with no questions or difficulty.    Time 4    Period Weeks    Status Achieved    Target Date 02/11/20               PT Long Term Goals -       PT LONG TERM GOAL #1   Title The patient will be compliant with finalized HEP with minimum assistance in preparation for self management and maintenance of condition.    Baseline 03/10/2020- Patient familiar with initial HEP and will keep goal active as HEP becomes more progressive including possible transfers and standing activities. 04/07/2020- Patient continues to report compliance with LE strengthening home exercises without questions or concerns. 04/21/2020- Patient able to verbalize and demonstrate good understanding of current HEP including some supine and seated LE strengthening exercises. Reviewed and patient performed 10 reps today with minimal cueing. Will keep goal active to progress HEP as appropriate. 05/28/2020- patient reports continues to perform LE strengthening as able and some help from caregiver as able. No changes at this time to home program as patient is limited to supine/seated therex. 07/02/2020- Patient continues to  report compliance with current supine and seated LE strengtheing home program and states no questions or concerns regarding her plan.    Time 12    Period Weeks    Status Achieved      PT LONG TERM GOAL #2   Title The patient will demonstrate at least 10 point improvement on FOTO score indicating an improved ability to perform functional activities.    Baseline on eval (/720) score 12; 10/22/19: 12; 12/10/19: 18, 12/13: 18: 04/07/2020- Will assess next visit. 07/02/2020- Will obtain next visit. 09/24/2020= Will obtain next visit. 11/12/2020= 12; 12/22/2020= 12; 03/30/2021= 17; 06/24/2021= Will assess next visit    Time 12    Period Weeks    Status On-going    Target Date 09/16/21      PT LONG TERM GOAL #3   Title The patient will demonstrate lateral scooting  for assistance with transfers with minA to maximize independence.    Baseline 10/22/19: Pt requires modA+1 for lateral scooting; 12/10/19: modA+1, 12/13: modA-maxA, 12/27:  modA-maxA, 03/10/2020- ModA-MaxA- increased verbal cues and visual demonstration. 04/07/2020- Continued Max assist to perform forward/lateral scoot. 04/21/2020- Patient was able to demo slight lateral scoot with CGA approx 12 in then fatigued requiring max assist- she is able to scoot forward by leaning without significant issues. 05/28/2020- While sitting in chair- patient able to perform forward and lateral scoot with VC/visual demo and increased time allowed- CGA today but not consistent yet- will keep goal active at this time to continue to focus on strengthening and improving technique. 07/02/2020- Patient able to demonstrate minimal ability to laterally shift weigh on mat table requiring max assist yet is able to forward scoot out to edge of mat table with min Assist. 08/20/2020 - Patient was able to demonstrate minimal lateral scooting at edge of mat while holding feet still so they did not slip on tile surface. She continues to be able to demonstrate scoot forward and uses tilt option to recline to position safely back into seat of power wheelchair. 09/25/2020- Patient able to sit on mat and minimally scoot laterally but requires min/mod A for efficient scooting. Will end goal at this time due to plateau in progress with scooting over extended time.    Time 12    Period Weeks    Status Not Met      PT LONG TERM GOAL #4   Title The patient will demonstrate a squat pivot transfer with minimum assistance to maximize independence and mobility.    Baseline 10/22/19: unable to safely attempt at this time; 12/10/19: unable to safely attempt at this time, 01/14/20: unable to safety attempt at this time. 03/10/2020- Patient able to perform Stand pivot transfer with Maximal assist. 04/07/2020- Patient continues to require Max assist with sit to stand- will assist SPT next visit. 04/21/2020- Patient presents with Max assist to perform Sit to stand Transfer- unable to pivot or move Left LE well without difficulty. 07/02/2020=  Max assist with SPT and minimal ability to pivot. 7/202/2022-Patient able to stand with minimal assist this visit (as good as CGA last visit) and able to move right foot to pivot and performing with moderate assist today. 09/24/2020- Patient able to perform sit to stand with Min assist from lift chair. Patient has demo inconsistency and will keep goal active.  12/17/2020- Goal is currently not appropriate as patient fractured her ankle and currently NWB. 12/22/2020=Goal is currently not appropriate as patient fractured her ankle and currently NWB.  03/30/2021=Patient was able to demo  min assist to stand holding onto PT back of arms yet continues to require max assist to pivot as she was unable to pivot or turn her legs consistently today. 06/24/2021=Able to stand with varying ability (anywhere from min/mod/max A) - last visit able to stand pivot transfer and move right LE as previously unable. 07/06/2021- Patient continues to stand with mod assist and able to move Right LE to pivot to right from w/c to mat table.    Time 12    Period Weeks    Status On-going    Target Date 09/16/21      PT LONG TERM GOAL #5   Title The patient will demonstrate sitting without UE support for 2-5 minutes at EOB to improve participation and maximize independence with ADLs.    Baseline 10/22/19: Pt able to sit unsupported for at least 2 minutes at edge of bed. 04/21/2020- Patient continues to demo good sitting at edge of mat > 5 miin without difficulty or back support.    Time 12    Period Weeks    Status Achieved      PT LONG TERM GOAL #6   Title Patient will demonstrate improved functional LE strength as seen by consistent ability to stand > 2 min (3 out of 4 trials) with Mod/Max A for improved LE strength with transfers.    Baseline 05/28/2020- Patient able to inconsistent stand 1-2 min right now in //bars with Mod/Max A. 07/02/2020- Patient was able to progress last 2 visit to standing using bilateral platform attachment  between 1-2 min which is a progression from the parallel bars. 08/20/2020- Patient performed static standing with min- mod A using bilateral platform attachment on walker for 6 min 30 sec today with assist for trunk control and intermittent verbal cues for posture. 09/24/2020- Patient has consistently been able to stand >2 min in // bars and when using bilateral Platform walker. 12/23/2020= Will reopen this goal as patient should transition back to weightbearing and again practice standing in new cert. 03/30/2021=1) 1 min 23 sec   2)  22mn 40 sec  3) 1 min 50 sec   Left LE hyperextended with increased difficulty tucking/extending hips today. Requiring more mod assist to remain standing. 06/24/2021=Patient was able to stand 5 trials and able to stand > 2 min for 1 of the 5 trials. 1) 1 min 40 sec 2) 2 min 38 sec 3) 38 sec (difficulty adjusting in standing and reported left LE discomfort 4) 1 min 28 sec 5) 1 min 35 sec  -rest break between each trial. However- Patient did demonstrate less overall UE support and more erect posture with ability to maintain extended hips over feet as previously much more difficult to maintain.    Time 12    Period Weeks    Status On-going    Target Date 09/15/21      PT LONG TERM GOAL #7   Title Patient/caregiver will demonstrate assisted transfer using [place] Lift consistently Independently for improved ability to transfer at home from lift chair to bed.    Baseline 09/24/2020- Patient requires hoyer lift (Dependent) for safe transfers.  12/17/2020- Patient currently still dependent on hoyer as she is currently non-weight bearing due to recent ankle fracture. 03/30/2021- Patient stated she has not tried. Agreeable to attempt in near future. 06/24/2021= Patient continues to rely on hoyer transfers at home.    Time 12    Period Weeks    Status Deferred    Target Date 06/22/21  PT LONG TERM GOAL #8   Title Patient will demo improved right LE quad strength as seen by ability to  extend knee to 20 deg from 0 or less while performing a Long arc quad (for improved terminal knee ext and more full ROM)  for improved ability to bearing weight with Right LE.    Baseline 12/22/2020=able to extend knee to approx 35 deg from zero during long arc quad; 2/27/203- WIll reassess next visit; 5/232023=23 deg from zero    Time 12    Period Weeks    Status On-going    Target Date 09/16/21      PT LONG TERM GOAL  #9   TITLE Patient will ambulate > 100 feet using LiteGait exhibiting reciprocal steps to prepare for more weighted ambulation in future.    Baseline 06/24/2021- Patient recently on 06/22/2021 ambulated approx 75 feet with litegait - but visits before only approx 35 feet. 07/06/2021- Patient able to ambulate approx 50 feet today using litegait and 2 person assist.    Time 12    Period Weeks    Status On-going    Target Date 09/16/21              Plan     Clinical Impression Statement Patient was able to participate well with LE ROM/strengthening as Nustep was introduced today. Patient was successful in ability to use equipment with close monitoring and some assist today. She was fatigued in both legs but able to perform well without report of pain- only fatigue. Patient will benefit from skilled Physical Therapy services to assist in improving functional mobility and strength to improve patient's quality of life.    Personal Factors and Comorbidities Age;Time since onset of injury/illness/exacerbation;Comorbidity 3+;Fitness    Comorbidities HTN, quadriparesis, history of DVT, neurogenic bladder    Examination-Activity Limitations Bathing;Hygiene/Grooming;Squat;Bed Mobility;Lift;Bend;Stand;Engineer, manufacturing;Toileting;Self Feeding;Transfers;Continence;Sit;Dressing;Sleep;Carry    Examination-Participation Restrictions Church;Laundry;Cleaning;Medication Management;Community Activity;Meal Prep;Interpersonal Relationship    Stability/Clinical Decision Making  Evolving/Moderate complexity    Rehab Potential Fair    PT Frequency 2x / week    PT Duration 12 weeks    PT Treatment/Interventions ADLs/Self Care Home Management;Electrical Stimulation;Therapeutic activities;Wheelchair mobility training;Vasopneumatic Device;Joint Manipulations;Vestibular;Passive range of motion;Patient/family education;Therapeutic exercise;DME Instruction;Biofeedback;Aquatic Therapy;Moist Heat;Gait training;Balance training;Orthotic Fit/Training;Dry needling;Energy conservation;Taping;Splinting;Neuromuscular re-education;Cryotherapy;Ultrasound;Functional mobility training    PT Next Visit Plan Continue with progressive standing/transfer training; Progress LE Strengthening. Gait training with lite gait. Obtain latest FOTO next visit    PT Home Exercise Plan no changes    Consulted and Agree with Plan of Care Patient               Lewis Moccasin, PT 07/30/2021, 3:34 PM

## 2021-08-03 ENCOUNTER — Ambulatory Visit: Payer: Medicare HMO | Attending: Internal Medicine

## 2021-08-03 ENCOUNTER — Ambulatory Visit: Payer: Medicare HMO | Admitting: Occupational Therapy

## 2021-08-03 ENCOUNTER — Encounter: Payer: Self-pay | Admitting: Occupational Therapy

## 2021-08-03 DIAGNOSIS — R262 Difficulty in walking, not elsewhere classified: Secondary | ICD-10-CM | POA: Insufficient documentation

## 2021-08-03 DIAGNOSIS — R278 Other lack of coordination: Secondary | ICD-10-CM | POA: Diagnosis present

## 2021-08-03 DIAGNOSIS — M6281 Muscle weakness (generalized): Secondary | ICD-10-CM | POA: Diagnosis present

## 2021-08-03 DIAGNOSIS — R269 Unspecified abnormalities of gait and mobility: Secondary | ICD-10-CM | POA: Diagnosis present

## 2021-08-03 DIAGNOSIS — S14129S Central cord syndrome at unspecified level of cervical spinal cord, sequela: Secondary | ICD-10-CM | POA: Insufficient documentation

## 2021-08-03 DIAGNOSIS — R2689 Other abnormalities of gait and mobility: Secondary | ICD-10-CM | POA: Diagnosis present

## 2021-08-03 DIAGNOSIS — R2681 Unsteadiness on feet: Secondary | ICD-10-CM | POA: Insufficient documentation

## 2021-08-03 NOTE — Therapy (Signed)
OUTPATIENT PHYSICAL THERAPY TREATMENT NOTE   Patient Name: Sherry [name] MRN: [id number] DOB:March 21, 1934, 87 y.o., female Today's Date: 08/03/2021  PCP: Dr. Viviana Simpler REFERRING PROVIDER: Dr. Viviana Simpler   PT End of Session - 08/03/21 1206     Visit Number [id number]    Number of Visits 172    Date for PT Re-Evaluation 09/16/21    Authorization Type [employer] FOTO performed by PT on eval (7/20), score 12, Progress note on 03/10/2020; PN on 08/20/2020    Authorization Time Period Recert 02/04/9700- 6/37/8588; PN on 5/0/2774; Recert 02/28/7865- 67/20/9470; PN on 9/62/8366; Recert 29/47/6546- 06/04/5463; Recert 6/81/2751-7/00/1749; Recert 4/49/6759- 1/63/8466    Progress Note Due on Visit 160    PT Start Time 1146    PT Stop Time 1222    PT Time Calculation (min) 36 min    Equipment Utilized During Treatment Gait belt    Activity Tolerance Patient tolerated treatment well;Patient limited by fatigue    Behavior During Therapy WFL for tasks assessed/performed             Past Medical History:  Diagnosis Date   Acute blood loss anemia    Arthritis    Cancer ([place])    skin   Central cord syndrome at C6 level of cervical spinal cord ([place]) 11/29/2017   Hypertension    Protein-calorie malnutrition, severe ([place]) 01/24/2018   S/P insertion of IVC (inferior vena caval) filter 01/24/2018   Tetraparesis ([place])    Past Surgical History:  Procedure Laterality Date   ANTERIOR CERVICAL DECOMP/DISCECTOMY FUSION N/A 11/29/2017   Procedure: Cervical five-six, six-seven Anterior Cervical Decompression Fusion;  Surgeon: Judith Part, MD;  Location: [facility];  Service: Neurosurgery;  Laterality: N/A;  Cervical five-six, six-seven Anterior Cervical Decompression Fusion   CATARACT EXTRACTION     IR IVC FILTER PLMT / S&I /IMG GUID/MOD SED  12/08/2017   Patient Active Problem List   Diagnosis Date Noted   Acute appendicitis with appendiceal abscess 05/30/2018   Hypocalcemia 02/15/2018    Dysuria 02/15/2018   Bilateral lower extremity edema 02/11/2018   Vaginal yeast infection 01/30/2018   Chest tightness 01/27/2018   Healthcare-associated pneumonia 01/25/2018   Pleural effusion, not elsewhere classified 01/25/2018   Acute deep vein thrombosis (DVT) of left lower extremity ([place]) 01/24/2018   Acute deep vein thrombosis (DVT) of right lower extremity ([place]) 01/24/2018   Chronic allergic rhinitis 01/24/2018   Depression with anxiety 01/24/2018   UTI due to Klebsiella species 01/24/2018   Protein-calorie malnutrition, severe ([place]) 01/24/2018   S/P insertion of IVC (inferior vena caval) filter 01/24/2018   Reactive depression    Hypokalemia    Leukocytosis    Essential hypertension    Trauma    Tetraparesis ([facility])    Neuropathic pain    Neurogenic bowel    Neurogenic bladder    Benign essential HTN    Acute blood loss anemia    Central cord syndrome at C6 level of cervical spinal cord ([place]) 11/29/2017   Central cord syndrome ([place]) 11/29/2017    REFERRING DIAG: Central cord syndrome at C6 level of cervical spinal cord, sequela ([place])  THERAPY DIAG:  Muscle weakness (generalized)  Other lack of coordination  Central cord syndrome, sequela ([facility])  Abnormality of gait and mobility  Difficulty in walking, not elsewhere classified  Other abnormalities of gait and mobility  Unsteadiness on feet  Rationale for Evaluation and Treatment Rehabilitation  PERTINENT HISTORY: Pt is an 87 yo female that fell in 2019,  fractured vertebrae in her neck and in her low back per family. 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 After hospital stay, pt discharged to [facility] for ~1 month, experienced severe UTI as well as bilateral DVT (IVC filter placed). Discharged to [facility], stayed for about a year, and then moved to [facility] in April 2021. Was receiving PT, but per family facility reported that did not have adequate equipment to maximize PT for her. Pt until  about 1 month ago was practicing sit to stand transfers with 1-2 people, and working on static standing. Has a brace for L foot due to PF. Pt currently needs assistance with all ADLs (able to assist with donning/doffing her shirt), bed baths, and needs a hoyer lift for transfers. Able to drive power wheelchair without assistance.   PRECAUTIONS: Fall  SUBJECTIVE: Patient reports feeling okay without any new concerns or events over the weekend  PAIN:  Are you having pain? No     TODAY'S TREATMENT:   Therapeutic activities:  Stand pivot transfer: Power w/c to [employer] assist and patient able to pivot/turn right LE - gait belt and patient holding onto PT  arms.  Patient able to pivot transfer with pivot/move Right LE on her own and requires mod assist to position back into seat of Nustep. Increased time to position patient safely including use of seat belt; Left foot harness, and UE/LE and seat position.  Therex:   Nustep - BUE/LE  with min assist using a strap to keep Right LE abd to avoid excessive hip adduction. Patient able to use B arms and then performed 15 min with 2 seated rest breaks. Patient was monitored continuously for muscle and cardiovascular response. Level 0- 3 min Level 2- 2 min Level 3- 1 min Level 0 - 1 min Level 2 - 1 min Level 4 - 1 min Level 1- 2 min Level 2 - 2 min Level 4 - 1 min Level 0 - 1 min    Sit to stand x 3 from seat of nustep- min/mod assist focusing on powering up with B LE's.    Education provided throughout session via VC/TC and demonstration to facilitate movement at target joints and correct muscle activation for all testing and exercises performed.      PATIENT EDUCATION: Education details: Standing posture Person educated: Patient Education method: Explanation, Demonstration, Tactile cues, and Verbal cues Education comprehension: verbalized understanding, returned demonstration, verbal cues required, tactile cues required, and needs  further education   HOME EXERCISE PROGRAM: No updates this session   PT Short Term Goals -       PT SHORT TERM GOAL #1   Title The patient will perform initial HEP with minimum assistance in order to improve strength and function.    Baseline Patient demonstrating independence with initial HEP as of 03/10/2020 with no questions or difficulty.    Time 4    Period Weeks    Status Achieved    Target Date 02/11/20              PT Long Term Goals -       PT LONG TERM GOAL #1   Title The patient will be compliant with finalized HEP with minimum assistance in preparation for self management and maintenance of condition.    Baseline 03/10/2020- Patient familiar with initial HEP and will keep goal active as HEP becomes more progressive including possible transfers and standing activities. 04/07/2020- Patient continues to report compliance with LE  strengthening home exercises without questions or concerns. 04/21/2020- Patient able to verbalize and demonstrate good understanding of current HEP including some supine and seated LE strengthening exercises. Reviewed and patient performed 10 reps today with minimal cueing. Will keep goal active to progress HEP as appropriate. 05/28/2020- patient reports continues to perform LE strengthening as able and some help from caregiver as able. No changes at this time to home program as patient is limited to supine/seated therex. 07/02/2020- Patient continues to report compliance with current supine and seated LE strengtheing home program and states no questions or concerns regarding her plan.    Time 12    Period Weeks    Status Achieved      PT LONG TERM GOAL #2   Title The patient will demonstrate at least 10 point improvement on FOTO score indicating an improved ability to perform functional activities.    Baseline on eval (/720) score 12; 10/22/19: 12; 12/10/19: 18, 12/13: 18: 04/07/2020- Will assess next visit. 07/02/2020- Will obtain next visit. 09/24/2020=  Will obtain next visit. 11/12/2020= 12; 12/22/2020= 12; 03/30/2021= 17; 06/24/2021= Will assess next visit    Time 12    Period Weeks    Status On-going    Target Date 09/16/21      PT LONG TERM GOAL #3   Title The patient will demonstrate lateral scooting  for assistance with transfers with minA to maximize independence.    Baseline 10/22/19: Pt requires modA+1 for lateral scooting; 12/10/19: modA+1, 12/13: modA-maxA, 12/27: modA-maxA, 03/10/2020- ModA-MaxA- increased verbal cues and visual demonstration. 04/07/2020- Continued Max assist to perform forward/lateral scoot. 04/21/2020- Patient was able to demo slight lateral scoot with CGA approx 12 in then fatigued requiring max assist- she is able to scoot forward by leaning without significant issues. 05/28/2020- While sitting in chair- patient able to perform forward and lateral scoot with VC/visual demo and increased time allowed- CGA today but not consistent yet- will keep goal active at this time to continue to focus on strengthening and improving technique. 07/02/2020- Patient able to demonstrate minimal ability to laterally shift weigh on mat table requiring max assist yet is able to forward scoot out to edge of mat table with min Assist. 08/20/2020 - Patient was able to demonstrate minimal lateral scooting at edge of mat while holding feet still so they did not slip on tile surface. She continues to be able to demonstrate scoot forward and uses tilt option to recline to position safely back into seat of power wheelchair. 09/25/2020- Patient able to sit on mat and minimally scoot laterally but requires min/mod A for efficient scooting. Will end goal at this time due to plateau in progress with scooting over extended time.    Time 12    Period Weeks    Status Not Met      PT LONG TERM GOAL #4   Title The patient will demonstrate a squat pivot transfer with minimum assistance to maximize independence and mobility.    Baseline 10/22/19: unable to safely  attempt at this time; 12/10/19: unable to safely attempt at this time, 01/14/20: unable to safety attempt at this time. 03/10/2020- Patient able to perform Stand pivot transfer with Maximal assist. 04/07/2020- Patient continues to require Max assist with sit to stand- will assist SPT next visit. 04/21/2020- Patient presents with Max assist to perform Sit to stand Transfer- unable to pivot or move Left LE well without difficulty. 07/02/2020= Max assist with SPT and minimal ability to pivot. 7/202/2022-Patient able to stand  with minimal assist this visit (as good as CGA last visit) and able to move right foot to pivot and performing with moderate assist today. 09/24/2020- Patient able to perform sit to stand with Min assist from lift chair. Patient has demo inconsistency and will keep goal active.  12/17/2020- Goal is currently not appropriate as patient fractured her ankle and currently NWB. 12/22/2020=Goal is currently not appropriate as patient fractured her ankle and currently NWB.  03/30/2021=Patient was able to demo min assist to stand holding onto PT back of arms yet continues to require max assist to pivot as she was unable to pivot or turn her legs consistently today. 06/24/2021=Able to stand with varying ability (anywhere from min/mod/max A) - last visit able to stand pivot transfer and move right LE as previously unable. 07/06/2021- Patient continues to stand with mod assist and able to move Right LE to pivot to right from w/c to mat table.    Time 12    Period Weeks    Status On-going    Target Date 09/16/21      PT LONG TERM GOAL #5   Title The patient will demonstrate sitting without UE support for 2-5 minutes at EOB to improve participation and maximize independence with ADLs.    Baseline 10/22/19: Pt able to sit unsupported for at least 2 minutes at edge of bed. 04/21/2020- Patient continues to demo good sitting at edge of mat > 5 miin without difficulty or back support.    Time 12    Period Weeks     Status Achieved      PT LONG TERM GOAL #6   Title Patient will demonstrate improved functional LE strength as seen by consistent ability to stand > 2 min (3 out of 4 trials) with Mod/Max A for improved LE strength with transfers.    Baseline 05/28/2020- Patient able to inconsistent stand 1-2 min right now in //bars with Mod/Max A. 07/02/2020- Patient was able to progress last 2 visit to standing using bilateral platform attachment between 1-2 min which is a progression from the parallel bars. 08/20/2020- Patient performed static standing with min- mod A using bilateral platform attachment on walker for 6 min 30 sec today with assist for trunk control and intermittent verbal cues for posture. 09/24/2020- Patient has consistently been able to stand >2 min in // bars and when using bilateral Platform walker. 12/23/2020= Will reopen this goal as patient should transition back to weightbearing and again practice standing in new cert. 03/30/2021=1) 1 min 23 sec   2)  62mn 40 sec  3) 1 min 50 sec   Left LE hyperextended with increased difficulty tucking/extending hips today. Requiring more mod assist to remain standing. 06/24/2021=Patient was able to stand 5 trials and able to stand > 2 min for 1 of the 5 trials. 1) 1 min 40 sec 2) 2 min 38 sec 3) 38 sec (difficulty adjusting in standing and reported left LE discomfort 4) 1 min 28 sec 5) 1 min 35 sec  -rest break between each trial. However- Patient did demonstrate less overall UE support and more erect posture with ability to maintain extended hips over feet as previously much more difficult to maintain.    Time 12    Period Weeks    Status On-going    Target Date 09/15/21      PT LONG TERM GOAL #7   Title Patient/caregiver will demonstrate assisted transfer using [place] Lift consistently Independently for improved ability to transfer at home  from lift chair to bed.    Baseline 09/24/2020- Patient requires hoyer lift (Dependent) for safe transfers.  12/17/2020-  Patient currently still dependent on hoyer as she is currently non-weight bearing due to recent ankle fracture. 03/30/2021- Patient stated she has not tried. Agreeable to attempt in near future. 06/24/2021= Patient continues to rely on hoyer transfers at home.    Time 12    Period Weeks    Status Deferred    Target Date 06/22/21      PT LONG TERM GOAL #8   Title Patient will demo improved right LE quad strength as seen by ability to extend knee to 20 deg from 0 or less while performing a Long arc quad (for improved terminal knee ext and more full ROM)  for improved ability to bearing weight with Right LE.    Baseline 12/22/2020=able to extend knee to approx 35 deg from zero during long arc quad; 2/27/203- WIll reassess next visit; 5/232023=23 deg from zero    Time 12    Period Weeks    Status On-going    Target Date 09/16/21      PT LONG TERM GOAL  #9   TITLE Patient will ambulate > 100 feet using LiteGait exhibiting reciprocal steps to prepare for more weighted ambulation in future.    Baseline 06/24/2021- Patient recently on 06/22/2021 ambulated approx 75 feet with litegait - but visits before only approx 35 feet. 07/06/2021- Patient able to ambulate approx 50 feet today using litegait and 2 person assist.    Time 12    Period Weeks    Status On-going    Target Date 09/16/21              Plan     Clinical Impression Statement Patient continues to present today with good response to treatment. She was  able to participate well with strengthening of UE and LE on  Nustep today. Patient was able to perform at interval training with fatigue reported as limiting factor. Patient will benefit from skilled Physical Therapy services to assist in improving functional mobility and strength to improve patient's quality of life.    Personal Factors and Comorbidities Age;Time since onset of injury/illness/exacerbation;Comorbidity 3+;Fitness    Comorbidities HTN, quadriparesis, history of DVT, neurogenic  bladder    Examination-Activity Limitations Bathing;Hygiene/Grooming;Squat;Bed Mobility;Lift;Bend;Stand;Engineer, manufacturing;Toileting;Self Feeding;Transfers;Continence;Sit;Dressing;Sleep;Carry    Examination-Participation Restrictions Church;Laundry;Cleaning;Medication Management;Community Activity;Meal Prep;Interpersonal Relationship    Stability/Clinical Decision Making Evolving/Moderate complexity    Rehab Potential Fair    PT Frequency 2x / week    PT Duration 12 weeks    PT Treatment/Interventions ADLs/Self Care Home Management;Electrical Stimulation;Therapeutic activities;Wheelchair mobility training;Vasopneumatic Device;Joint Manipulations;Vestibular;Passive range of motion;Patient/family education;Therapeutic exercise;DME Instruction;Biofeedback;Aquatic Therapy;Moist Heat;Gait training;Balance training;Orthotic Fit/Training;Dry needling;Energy conservation;Taping;Splinting;Neuromuscular re-education;Cryotherapy;Ultrasound;Functional mobility training    PT Next Visit Plan Continue with progressive standing/transfer training; Progress LE Strengthening. Gait training with lite gait. Obtain latest FOTO next visit    PT Home Exercise Plan no changes    Consulted and Agree with Plan of Care Patient               Lewis Moccasin, PT 08/03/2021, 7:48 PM

## 2021-08-03 NOTE — Therapy (Signed)
OUTPATIENT OCCUPATIONAL THERAPY TREATMENT NOTE   Patient Name: Sherry [name] MRN: [id number] DOB:09/11/1934, 87 y.o., female Today's Date: 08/03/2021  PCP: Dr. Viviana Simpler REFERRING PROVIDER: Dr. Viviana Simpler   OT End of Session - 08/03/21 1404     Visit Number [id number]    Number of Visits 154    Date for OT Re-Evaluation 09/28/21    Authorization Time Period Progress report period starting 06/01/2021    OT Start Time 1100    OT Stop Time 1145    OT Time Calculation (min) 45 min    Activity Tolerance Patient tolerated treatment well    Behavior During Therapy [facility] for tasks assessed/performed             Past Medical History:  Diagnosis Date   Acute blood loss anemia    Arthritis    Cancer ([place])    skin   Central cord syndrome at C6 level of cervical spinal cord ([place]) 11/29/2017   Hypertension    Protein-calorie malnutrition, severe ([place]) 01/24/2018   S/P insertion of IVC (inferior vena caval) filter 01/24/2018   Tetraparesis ([place])    Past Surgical History:  Procedure Laterality Date   ANTERIOR CERVICAL DECOMP/DISCECTOMY FUSION N/A 11/29/2017   Procedure: Cervical five-six, six-seven Anterior Cervical Decompression Fusion;  Surgeon: Judith Part, MD;  Location: [place];  Service: Neurosurgery;  Laterality: N/A;  Cervical five-six, six-seven Anterior Cervical Decompression Fusion   CATARACT EXTRACTION     IR IVC FILTER PLMT / S&I /IMG GUID/MOD SED  12/08/2017   Patient Active Problem List   Diagnosis Date Noted   Acute appendicitis with appendiceal abscess 05/30/2018   Hypocalcemia 02/15/2018   Dysuria 02/15/2018   Bilateral lower extremity edema 02/11/2018   Vaginal yeast infection 01/30/2018   Chest tightness 01/27/2018   Healthcare-associated pneumonia 01/25/2018   Pleural effusion, not elsewhere classified 01/25/2018   Acute deep vein thrombosis (DVT) of left lower extremity ([place]) 01/24/2018   Acute deep vein thrombosis (DVT) of right lower extremity  ([place]) 01/24/2018   Chronic allergic rhinitis 01/24/2018   Depression with anxiety 01/24/2018   UTI due to Klebsiella species 01/24/2018   Protein-calorie malnutrition, severe ([place]) 01/24/2018   S/P insertion of IVC (inferior vena caval) filter 01/24/2018   Reactive depression    Hypokalemia    Leukocytosis    Essential hypertension    Trauma    Tetraparesis ([facility])    Neuropathic pain    Neurogenic bowel    Neurogenic bladder    Benign essential HTN    Acute blood loss anemia    Central cord syndrome at C6 level of cervical spinal cord ([place]) 11/29/2017   Central cord syndrome ([place]) 11/29/2017      REFERRING DIAG: Central Cord Syndrome at C6 of the Cervical Spinal Cord  THERAPY DIAG:  Muscle weakness (generalized)  Other lack of coordination  Rationale for Evaluation and Treatment Rehabilitation  PERTINENT HISTORY: Patient s/p fall November 29, 2017 resulting in diagnosis of central cord syndrome at C 6 level.  She has had therapy in multiple venues but with recent move to [facility], therapy staff recommended she seek outpatient therapy for her needs  PRECAUTIONS: None  SUBJECTIVE:  Pt. reports that the results of her Biopsy should be ready in 2 weeks.   PAIN:  Are you having pain? No     OBJECTIVE:   TODAY'S TREATMENT:       Therapeutic Ex.   Pt. tolerated PROM followed by AROM bilateral wrist extension,  PROM for bilateral digit MP, PIP, and DIP flexion, and extension, thumb radial, and palmar abduction in conjunction with moist heat. Emphasis was placed on left wrist extension, bilateral thumb radial, and palmar abduction, and bilateral digit MP, PIP, and DIP flexion. Pt. worked on [employer], and reciprocal motion using the UBE while seated for 8 min. with no resistance. Constant monitoring was provided. BUE ROM, and strengthening was performed in preparation for functional reaching, and functional hand use.   Pt. now has a Bandaid over the Biopsy site on  the dorsal aspect of the right hand. Moist heat placement was modified to avoid the dorsal of the right hand, and was placed distally at the digits only. Pt. continues to tolerate the UBE without difficulty with increasing resistance. Pt. tolerated ROM well without reports of pain, or discomfort following moist heat modality. Pt. presents with tightness at the bilateral thumb webspace. Pt. continues to present with bilateral wrist flexor tightness, and digit MP, PIP, and DIP extensor tightness. Pt. continues to benefit from working on impoving ROM in order to work towards increasing engagement of her bilateral hands during ADLs, and IADL tasks.          PATIENT EDUCATION: Education details: BUE ROM, hand function, UE functioning Person educated: Patient Education method: Customer service manager Education comprehension: verbalized understanding, returned demonstration, and needs further education   HOME EXERCISE PROGRAM Continue with ongoing HEPs     OT Long Term Goals - 07/06/21 1106       OT LONG TERM GOAL #1   Title Patient and caregiver will demonstrate understanding of home exercise program for ROM.    Baseline 06/01/2021: Pt. now has new restorative aides who are assiting with daily ROM at [facility] now. Pt. has recently been in quarantine for COVID-19. Pt.'s current restorative aide is retiring at the end of December. Pt. continues to have a restorative aide rehab aide assist her wih exercises at [facility]. 8/8: Independently completes/directs HEP mulitple times/week. 12/17/2020: Pt. has a new restorative aide who will be resuming ROM program.    Time 12    Period Weeks    Status Achieved      OT LONG TERM GOAL #2   Title Patient will demonstrate ability to don shirt with min assist from seated position.    Baseline Pt. is indepedent with a larger shirt, and continues to require increased time to complete,    Time 12    Period Weeks    Status Achieved      OT LONG TERM  GOAL #4   Title Patient will demonstrate improved composite finger flexion to hold deodorant to apply to underarms.    Baseline 01/12/2021:  Pt. is progressing, and is able to reach to grasp, and hold narrow cones. 10/20/2020: Pt. conitnues to make steady progress with bilateral digit flexion.Pt. continues to present with stiifness/tightness which is hindering pt.'s ability to formulate a full composite fist. Pt. continues to present with improving finger flexion of R hand for a partial gross grasp, but unable to grasp and hold items. pt. conitnues to present with limited digit flexion of L hand. Pt. has active left thumb abduction. 8/8: holds cup with 2 hands, continues to have limited composite finger flexion L worse than R. 02/16/2021: pt. continues to present with limited bilateral gross composite fisting. 04/08/2021: pt. continues to present with limited digit flexion affecting her ability to formulate bilateral composite fisting. 110th visivt: Pt. is able to hold the utensils in  preparation for self-feeding. 06/01/2021: continues to have difficulty formulating a fist, however has improved with self-feeding, and is now using standard utensils.07/06/2021: Pt. continues to have difficulty holding deoderant, and use deoderant. pt. is improving with holding similiar sized objects.    Time 12    Period Weeks    Status On-going    Target Date 09/28/21      Long Term Additional Goals   Additional Long Term Goals Yes      OT LONG TERM GOAL #6   Title Patient will increase right UE ROM to comb the back of her hair with modified independence.    Baseline 01/12/2022: Pt. is able to comb her hair, reaching to the back of her hair.. 10/20/2020: Pt. continues to progress wtith RRUE ROM, and is able to use a pic for the back of her head, however is unable to brush the back of her hair. 07/30/2020: Pt. is now able to comb the back of her hair with a pic, however continues to work on completing the task efficiently. 8/8:  Achieves with great effort, has assistance for efficiancy. 02/16/2021: Pt. is now able to reach, and brush the back of her hair. 04/08/2021: pt. continues to improve reach towards the back of her hair. 110th visit: Pt. is to reach towards the back of her hair, however has difficulty sustaining her arms in elevaton for the entire duration of perfroming hair cre thoroughly. 06/01/2021: Pt. is able to reach to the back of her head, however has difficutly with thoroughness. 07/06/2021: Pt. continues to improve with reaching to the back of her hair, however has difficulty with thoroughly perfroming the task in the back of her hair.    Time 12    Period Weeks    Status Partially Met    Target Date 09/28/21      OT LONG TERM GOAL #8   Title Pt. will write, and sign her name with 100% legibility, and modified independence.    Baseline 01/12/2021: Pt. continues to fill out her daily menu and complete puzzles. 10/20/2020: Pt. continues to make progress overall. Pt. continues to consistently fill out her daily menu, and writing for crossword puzzles. Pt. is able to maintain grasp on a wide width pen. Contineus to work on [employer]. 8/8: writes daily, continues to work on improving speed. 02/16/2021: Pt. continues to present wiht limited writing legibility, fluctuating daily. 04/08/2021: Pt. continues to to be able to fill out her daily menu.limited writing legibility for daily puzzles.110th visit: Pt. continues to present with limited legibility, and efficiency for completing daily puzzles. 06/01/2021: pt. continues to work on the consistency with legibility 07/06/2021: Pt. continues to work on improving consistency with legibility.    Time 12    Period Weeks    Status On-going    Target Date 09/28/21      OT LONG TERM GOAL  #9   TITLE Pt. will turn pages in a book with modified independence.    Baseline 07/06/2021: Pt. has difficulty turning pages.    Time 12    Period Weeks    Status New    Target Date  07/06/21              Plan - 07/22/21 1737     Clinical Impression Statement Pt. now has a Bandaid over the Biopsy site on the dorsal aspect of the right hand. Moist heat placement was modified to avoid the dorsal of the right hand, and was placed distally at the  digits only. Pt. continues to tolerate the UBE without difficulty with increasing resistance. Pt. tolerated ROM well without reports of pain, or discomfort following moist heat modality. Pt. presents with tightness at the bilateral thumb webspace. Pt. continues to present with bilateral wrist flexor tightness, and digit MP, PIP, and DIP extensor tightness. Pt. continues to benefit from working on impoving ROM in order to work towards increasing engagement of her bilateral hands during ADLs, and IADL tasks.             OT Occupational Profile and History Detailed Assessment- Review of Records and additional review of physical, cognitive, psychosocial history related to current functional performance    Occupational performance deficits (Please refer to evaluation for details): ADL's;IADL's;Leisure;Social Participation    Body Structure / Function / Physical Skills ADL;Continence;Dexterity;Flexibility;Strength;ROM;Balance;Coordination;FMC;IADL;Endurance;UE functional use;Decreased knowledge of use of DME;GMC    Psychosocial Skills Coping Strategies    Rehab Potential Fair    Clinical Decision Making Several treatment options, min-mod task modification necessary    Comorbidities Affecting Occupational Performance: Presence of comorbidities impacting occupational performance    Comorbidities impacting occupational performance description: contractures of bilateral hands, dependent transfers,    Modification or Assistance to Complete Evaluation  No modification of tasks or assist necessary to complete eval    OT Frequency 2x / week    OT Duration 12 weeks    OT Treatment/Interventions Self-care/ADL training;Cryotherapy;Therapeutic  exercise;DME and/or AE instruction;Balance training;Neuromuscular education;Manual Therapy;Splinting;Moist Heat;Passive range of motion;Therapeutic activities;Patient/family education    Plan continue to progress ROM of digits, wrists and shoulders as it pertains to completion of ADL tasks that pt values.    Consulted and Agree with Plan of Care Patient            Harrel Carina, MS, OTR/L  Harrel Carina, OT 08/03/2021, 2:06 PM

## 2021-08-05 ENCOUNTER — Encounter: Payer: Self-pay | Admitting: Occupational Therapy

## 2021-08-05 ENCOUNTER — Ambulatory Visit: Payer: Medicare HMO

## 2021-08-05 ENCOUNTER — Ambulatory Visit: Payer: Medicare HMO | Admitting: Occupational Therapy

## 2021-08-05 DIAGNOSIS — R2689 Other abnormalities of gait and mobility: Secondary | ICD-10-CM

## 2021-08-05 DIAGNOSIS — R269 Unspecified abnormalities of gait and mobility: Secondary | ICD-10-CM

## 2021-08-05 DIAGNOSIS — M6281 Muscle weakness (generalized): Secondary | ICD-10-CM

## 2021-08-05 DIAGNOSIS — R2681 Unsteadiness on feet: Secondary | ICD-10-CM

## 2021-08-05 DIAGNOSIS — L02411 Cutaneous abscess of right axilla: Secondary | ICD-10-CM | POA: Diagnosis not present

## 2021-08-05 DIAGNOSIS — R278 Other lack of coordination: Secondary | ICD-10-CM

## 2021-08-05 DIAGNOSIS — R262 Difficulty in walking, not elsewhere classified: Secondary | ICD-10-CM

## 2021-08-05 DIAGNOSIS — S14129S Central cord syndrome at unspecified level of cervical spinal cord, sequela: Secondary | ICD-10-CM

## 2021-08-05 NOTE — Therapy (Signed)
OUTPATIENT PHYSICAL THERAPY TREATMENT NOTE   Patient Name: Sherry [name] MRN: [id number] DOB:Apr 14, 1934, 87 y.o., female Today's Date: 08/06/2021  PCP: Dr. Viviana Simpler REFERRING PROVIDER: Dr. Viviana Simpler   PT End of Session - 08/05/21 1200     Visit Number [id number]    Number of Visits 172    Date for PT Re-Evaluation 09/16/21    Authorization Type [employer] FOTO performed by PT on eval (7/20), score 12, Progress note on 03/10/2020; PN on 08/20/2020    Authorization Time Period Recert 07/04/7856- 8/50/2774; PN on 02/03/8784; Recert 7/67/2094- 70/96/2836; PN on 07/31/4763; Recert 46/50/3546- 5/68/1275; Recert 1/70/0174-9/44/9675; Recert 10/17/3844- 6/59/9357    Progress Note Due on Visit 160    PT Start Time 1145    PT Stop Time 1225    PT Time Calculation (min) 40 min    Equipment Utilized During Treatment Gait belt    Activity Tolerance Patient tolerated treatment well;Patient limited by fatigue    Behavior During Therapy WFL for tasks assessed/performed             Past Medical History:  Diagnosis Date   Acute blood loss anemia    Arthritis    Cancer ([place])    skin   Central cord syndrome at C6 level of cervical spinal cord ([place]) 11/29/2017   Hypertension    Protein-calorie malnutrition, severe ([place]) 01/24/2018   S/P insertion of IVC (inferior vena caval) filter 01/24/2018   Tetraparesis ([place])    Past Surgical History:  Procedure Laterality Date   ANTERIOR CERVICAL DECOMP/DISCECTOMY FUSION N/A 11/29/2017   Procedure: Cervical five-six, six-seven Anterior Cervical Decompression Fusion;  Surgeon: Judith Part, MD;  Location: [facility];  Service: Neurosurgery;  Laterality: N/A;  Cervical five-six, six-seven Anterior Cervical Decompression Fusion   CATARACT EXTRACTION     IR IVC FILTER PLMT / S&I /IMG GUID/MOD SED  12/08/2017   Patient Active Problem List   Diagnosis Date Noted   Acute appendicitis with appendiceal abscess 05/30/2018   Hypocalcemia 02/15/2018    Dysuria 02/15/2018   Bilateral lower extremity edema 02/11/2018   Vaginal yeast infection 01/30/2018   Chest tightness 01/27/2018   Healthcare-associated pneumonia 01/25/2018   Pleural effusion, not elsewhere classified 01/25/2018   Acute deep vein thrombosis (DVT) of left lower extremity ([place]) 01/24/2018   Acute deep vein thrombosis (DVT) of right lower extremity ([place]) 01/24/2018   Chronic allergic rhinitis 01/24/2018   Depression with anxiety 01/24/2018   UTI due to Klebsiella species 01/24/2018   Protein-calorie malnutrition, severe ([place]) 01/24/2018   S/P insertion of IVC (inferior vena caval) filter 01/24/2018   Reactive depression    Hypokalemia    Leukocytosis    Essential hypertension    Trauma    Tetraparesis ([facility])    Neuropathic pain    Neurogenic bowel    Neurogenic bladder    Benign essential HTN    Acute blood loss anemia    Central cord syndrome at C6 level of cervical spinal cord ([place]) 11/29/2017   Central cord syndrome ([place]) 11/29/2017    REFERRING DIAG: Central cord syndrome at C6 level of cervical spinal cord, sequela ([place])  THERAPY DIAG:  Muscle weakness (generalized)  Other lack of coordination  Central cord syndrome, sequela ([facility])  Abnormality of gait and mobility  Difficulty in walking, not elsewhere classified  Other abnormalities of gait and mobility  Unsteadiness on feet  Rationale for Evaluation and Treatment Rehabilitation  PERTINENT HISTORY: Pt is an 87 yo female that fell in 2019,  fractured vertebrae in her neck and in her low back per family. 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 After hospital stay, pt discharged to [facility] for ~1 month, experienced severe UTI as well as bilateral DVT (IVC filter placed). Discharged to [facility], stayed for about a year, and then moved to [facility] in April 2021. Was receiving PT, but per family facility reported that did not have adequate equipment to maximize PT for her. Pt until  about 1 month ago was practicing sit to stand transfers with 1-2 people, and working on static standing. Has a brace for L foot due to PF. Pt currently needs assistance with all ADLs (able to assist with donning/doffing her shirt), bed baths, and needs a hoyer lift for transfers. Able to drive power wheelchair without assistance.   PRECAUTIONS: Fall  SUBJECTIVE: Patient reports doing okay. States worked hard with her arms in her OT session.   PAIN:  Are you having pain? No     TODAY'S TREATMENT:   Therapeutic activities:  Stand pivot transfer: Power w/c to [employer] assist and patient able to pivot/turn right LE - gait belt and patient holding onto PT  arms.  Patient able to pivot transfer with pivot/move Right LE on her own and requires mod assist to position back into seat of Nustep. Increased time to position patient safely including use of seat belt; Left foot harness, and UE/LE and seat position.  Therex:   Nustep - BUE/LE  with 5 min set up with positioning including scooting hips back; seat belt, Left LE strap.  Patient able to use B arms as well. Patient was monitored continuously for muscle and cardiovascular response. Level 0- 1 min Level 2- 96mn Level 3- 1 min Level 4- 1 min Level 0 - 1 min Level 2 - 1 min Level 3-  1 min Level 4 - 1 min Level 0 - 30 Sec Level 2-30 sec Level 4- 30 sec Level 0- 30 sec  Total time= 10 miin = VC to keep SPC > 50  Total Distance= 0.26 mi    Sit to stand x 5 from seat of nustep- min/mod assist focusing on powering up with B LE's. Patient with mild difficulty standing erect requiring intermittent blocking of both knees.    Education provided throughout session via VC/TC and demonstration to facilitate movement at target joints and correct muscle activation for all testing and exercises performed.      PATIENT EDUCATION: Education details: Standing posture Person educated: Patient Education method: Explanation, Demonstration,  Tactile cues, and Verbal cues Education comprehension: verbalized understanding, returned demonstration, verbal cues required, tactile cues required, and needs further education   HOME EXERCISE PROGRAM: No updates this session   PT Short Term Goals -       PT SHORT TERM GOAL #1   Title The patient will perform initial HEP with minimum assistance in order to improve strength and function.    Baseline Patient demonstrating independence with initial HEP as of 03/10/2020 with no questions or difficulty.    Time 4    Period Weeks    Status Achieved    Target Date 02/11/20              PT Long Term Goals -       PT LONG TERM GOAL #1   Title The patient will be compliant with finalized HEP with minimum assistance in preparation for self management and maintenance of condition.    Baseline 03/10/2020-  Patient familiar with initial HEP and will keep goal active as HEP becomes more progressive including possible transfers and standing activities. 04/07/2020- Patient continues to report compliance with LE strengthening home exercises without questions or concerns. 04/21/2020- Patient able to verbalize and demonstrate good understanding of current HEP including some supine and seated LE strengthening exercises. Reviewed and patient performed 10 reps today with minimal cueing. Will keep goal active to progress HEP as appropriate. 05/28/2020- patient reports continues to perform LE strengthening as able and some help from caregiver as able. No changes at this time to home program as patient is limited to supine/seated therex. 07/02/2020- Patient continues to report compliance with current supine and seated LE strengtheing home program and states no questions or concerns regarding her plan.    Time 12    Period Weeks    Status Achieved      PT LONG TERM GOAL #2   Title The patient will demonstrate at least 10 point improvement on FOTO score indicating an improved ability to perform functional  activities.    Baseline on eval (/720) score 12; 10/22/19: 12; 12/10/19: 18, 12/13: 18: 04/07/2020- Will assess next visit. 07/02/2020- Will obtain next visit. 09/24/2020= Will obtain next visit. 11/12/2020= 12; 12/22/2020= 12; 03/30/2021= 17; 06/24/2021= Will assess next visit    Time 12    Period Weeks    Status On-going    Target Date 09/16/21      PT LONG TERM GOAL #3   Title The patient will demonstrate lateral scooting  for assistance with transfers with minA to maximize independence.    Baseline 10/22/19: Pt requires modA+1 for lateral scooting; 12/10/19: modA+1, 12/13: modA-maxA, 12/27: modA-maxA, 03/10/2020- ModA-MaxA- increased verbal cues and visual demonstration. 04/07/2020- Continued Max assist to perform forward/lateral scoot. 04/21/2020- Patient was able to demo slight lateral scoot with CGA approx 12 in then fatigued requiring max assist- she is able to scoot forward by leaning without significant issues. 05/28/2020- While sitting in chair- patient able to perform forward and lateral scoot with VC/visual demo and increased time allowed- CGA today but not consistent yet- will keep goal active at this time to continue to focus on strengthening and improving technique. 07/02/2020- Patient able to demonstrate minimal ability to laterally shift weigh on mat table requiring max assist yet is able to forward scoot out to edge of mat table with min Assist. 08/20/2020 - Patient was able to demonstrate minimal lateral scooting at edge of mat while holding feet still so they did not slip on tile surface. She continues to be able to demonstrate scoot forward and uses tilt option to recline to position safely back into seat of power wheelchair. 09/25/2020- Patient able to sit on mat and minimally scoot laterally but requires min/mod A for efficient scooting. Will end goal at this time due to plateau in progress with scooting over extended time.    Time 12    Period Weeks    Status Not Met      PT LONG TERM GOAL #4    Title The patient will demonstrate a squat pivot transfer with minimum assistance to maximize independence and mobility.    Baseline 10/22/19: unable to safely attempt at this time; 12/10/19: unable to safely attempt at this time, 01/14/20: unable to safety attempt at this time. 03/10/2020- Patient able to perform Stand pivot transfer with Maximal assist. 04/07/2020- Patient continues to require Max assist with sit to stand- will assist SPT next visit. 04/21/2020- Patient presents with Max assist to  perform Sit to stand Transfer- unable to pivot or move Left LE well without difficulty. 07/02/2020= Max assist with SPT and minimal ability to pivot. 7/202/2022-Patient able to stand with minimal assist this visit (as good as CGA last visit) and able to move right foot to pivot and performing with moderate assist today. 09/24/2020- Patient able to perform sit to stand with Min assist from lift chair. Patient has demo inconsistency and will keep goal active.  12/17/2020- Goal is currently not appropriate as patient fractured her ankle and currently NWB. 12/22/2020=Goal is currently not appropriate as patient fractured her ankle and currently NWB.  03/30/2021=Patient was able to demo min assist to stand holding onto PT back of arms yet continues to require max assist to pivot as she was unable to pivot or turn her legs consistently today. 06/24/2021=Able to stand with varying ability (anywhere from min/mod/max A) - last visit able to stand pivot transfer and move right LE as previously unable. 07/06/2021- Patient continues to stand with mod assist and able to move Right LE to pivot to right from w/c to mat table.    Time 12    Period Weeks    Status On-going    Target Date 09/16/21      PT LONG TERM GOAL #5   Title The patient will demonstrate sitting without UE support for 2-5 minutes at EOB to improve participation and maximize independence with ADLs.    Baseline 10/22/19: Pt able to sit unsupported for at least 2 minutes at  edge of bed. 04/21/2020- Patient continues to demo good sitting at edge of mat > 5 miin without difficulty or back support.    Time 12    Period Weeks    Status Achieved      PT LONG TERM GOAL #6   Title Patient will demonstrate improved functional LE strength as seen by consistent ability to stand > 2 min (3 out of 4 trials) with Mod/Max A for improved LE strength with transfers.    Baseline 05/28/2020- Patient able to inconsistent stand 1-2 min right now in //bars with Mod/Max A. 07/02/2020- Patient was able to progress last 2 visit to standing using bilateral platform attachment between 1-2 min which is a progression from the parallel bars. 08/20/2020- Patient performed static standing with min- mod A using bilateral platform attachment on walker for 6 min 30 sec today with assist for trunk control and intermittent verbal cues for posture. 09/24/2020- Patient has consistently been able to stand >2 min in // bars and when using bilateral Platform walker. 12/23/2020= Will reopen this goal as patient should transition back to weightbearing and again practice standing in new cert. 03/30/2021=1) 1 min 23 sec   2)  23mn 40 sec  3) 1 min 50 sec   Left LE hyperextended with increased difficulty tucking/extending hips today. Requiring more mod assist to remain standing. 06/24/2021=Patient was able to stand 5 trials and able to stand > 2 min for 1 of the 5 trials. 1) 1 min 40 sec 2) 2 min 38 sec 3) 38 sec (difficulty adjusting in standing and reported left LE discomfort 4) 1 min 28 sec 5) 1 min 35 sec  -rest break between each trial. However- Patient did demonstrate less overall UE support and more erect posture with ability to maintain extended hips over feet as previously much more difficult to maintain.    Time 12    Period Weeks    Status On-going    Target Date 09/15/21  PT LONG TERM GOAL #7   Title Patient/caregiver will demonstrate assisted transfer using [place] Lift consistently Independently for improved  ability to transfer at home from lift chair to bed.    Baseline 09/24/2020- Patient requires hoyer lift (Dependent) for safe transfers.  12/17/2020- Patient currently still dependent on hoyer as she is currently non-weight bearing due to recent ankle fracture. 03/30/2021- Patient stated she has not tried. Agreeable to attempt in near future. 06/24/2021= Patient continues to rely on hoyer transfers at home.    Time 12    Period Weeks    Status Deferred    Target Date 06/22/21      PT LONG TERM GOAL #8   Title Patient will demo improved right LE quad strength as seen by ability to extend knee to 20 deg from 0 or less while performing a Long arc quad (for improved terminal knee ext and more full ROM)  for improved ability to bearing weight with Right LE.    Baseline 12/22/2020=able to extend knee to approx 35 deg from zero during long arc quad; 2/27/203- WIll reassess next visit; 5/232023=23 deg from zero    Time 12    Period Weeks    Status On-going    Target Date 09/16/21      PT LONG TERM GOAL  #9   TITLE Patient will ambulate > 100 feet using LiteGait exhibiting reciprocal steps to prepare for more weighted ambulation in future.    Baseline 06/24/2021- Patient recently on 06/22/2021 ambulated approx 75 feet with litegait - but visits before only approx 35 feet. 07/06/2021- Patient able to ambulate approx 50 feet today using litegait and 2 person assist.    Time 12    Period Weeks    Status On-going    Target Date 09/16/21              Plan     Clinical Impression Statement Patient continues to progress with improving LE strength with interval nustep training. She was able to complete more resistance today without significant fatigue.  Patient will benefit from skilled Physical Therapy services to assist in improving functional mobility and strength to improve patient's quality of life.    Personal Factors and Comorbidities Age;Time since onset of injury/illness/exacerbation;Comorbidity  3+;Fitness    Comorbidities HTN, quadriparesis, history of DVT, neurogenic bladder    Examination-Activity Limitations Bathing;Hygiene/Grooming;Squat;Bed Mobility;Lift;Bend;Stand;Engineer, manufacturing;Toileting;Self Feeding;Transfers;Continence;Sit;Dressing;Sleep;Carry    Examination-Participation Restrictions Church;Laundry;Cleaning;Medication Management;Community Activity;Meal Prep;Interpersonal Relationship    Stability/Clinical Decision Making Evolving/Moderate complexity    Rehab Potential Fair    PT Frequency 2x / week    PT Duration 12 weeks    PT Treatment/Interventions ADLs/Self Care Home Management;Electrical Stimulation;Therapeutic activities;Wheelchair mobility training;Vasopneumatic Device;Joint Manipulations;Vestibular;Passive range of motion;Patient/family education;Therapeutic exercise;DME Instruction;Biofeedback;Aquatic Therapy;Moist Heat;Gait training;Balance training;Orthotic Fit/Training;Dry needling;Energy conservation;Taping;Splinting;Neuromuscular re-education;Cryotherapy;Ultrasound;Functional mobility training    PT Next Visit Plan Continue with progressive standing/transfer training; Progress LE Strengthening. Gait training with lite gait. Obtain latest FOTO next visit    PT Home Exercise Plan no changes    Consulted and Agree with Plan of Care Patient               Lewis Moccasin, PT 08/06/2021, 3:34 PM

## 2021-08-05 NOTE — Therapy (Signed)
OUTPATIENT OCCUPATIONAL THERAPY TREATMENT NOTE   Patient Name: Sherry [name] MRN: [id number] DOB:02-04-1934, 87 y.o., female Today's Date: 08/05/2021  PCP: Dr. Viviana Simpler REFERRING PROVIDER: Dr. Viviana Simpler   OT End of Session - 08/05/21 1250     Visit Number [id number]    Number of Visits 154    Date for OT Re-Evaluation 09/28/21    Authorization Time Period Progress report period starting 06/01/2021    OT Start Time 1100    OT Stop Time 1145    OT Time Calculation (min) 45 min    Activity Tolerance Patient tolerated treatment well    Behavior During Therapy [facility] for tasks assessed/performed             Past Medical History:  Diagnosis Date   Acute blood loss anemia    Arthritis    Cancer ([place])    skin   Central cord syndrome at C6 level of cervical spinal cord ([place]) 11/29/2017   Hypertension    Protein-calorie malnutrition, severe ([place]) 01/24/2018   S/P insertion of IVC (inferior vena caval) filter 01/24/2018   Tetraparesis ([place])    Past Surgical History:  Procedure Laterality Date   ANTERIOR CERVICAL DECOMP/DISCECTOMY FUSION N/A 11/29/2017   Procedure: Cervical five-six, six-seven Anterior Cervical Decompression Fusion;  Surgeon: Judith Part, MD;  Location: [place];  Service: Neurosurgery;  Laterality: N/A;  Cervical five-six, six-seven Anterior Cervical Decompression Fusion   CATARACT EXTRACTION     IR IVC FILTER PLMT / S&I /IMG GUID/MOD SED  12/08/2017   Patient Active Problem List   Diagnosis Date Noted   Acute appendicitis with appendiceal abscess 05/30/2018   Hypocalcemia 02/15/2018   Dysuria 02/15/2018   Bilateral lower extremity edema 02/11/2018   Vaginal yeast infection 01/30/2018   Chest tightness 01/27/2018   Healthcare-associated pneumonia 01/25/2018   Pleural effusion, not elsewhere classified 01/25/2018   Acute deep vein thrombosis (DVT) of left lower extremity ([place]) 01/24/2018   Acute deep vein thrombosis (DVT) of right lower extremity  ([employer]) 01/24/2018   Chronic allergic rhinitis 01/24/2018   Depression with anxiety 01/24/2018   UTI due to Klebsiella species 01/24/2018   Protein-calorie malnutrition, severe ([place]) 01/24/2018   S/P insertion of IVC (inferior vena caval) filter 01/24/2018   Reactive depression    Hypokalemia    Leukocytosis    Essential hypertension    Trauma    Tetraparesis ([facility])    Neuropathic pain    Neurogenic bowel    Neurogenic bladder    Benign essential HTN    Acute blood loss anemia    Central cord syndrome at C6 level of cervical spinal cord ([place]) 11/29/2017   Central cord syndrome ([place]) 11/29/2017      REFERRING DIAG: Central Cord Syndrome at C6 of the Cervical Spinal Cord  THERAPY DIAG:  Muscle weakness (generalized)  Rationale for Evaluation and Treatment Rehabilitation  PERTINENT HISTORY: Patient s/p fall November 29, 2017 resulting in diagnosis of central cord syndrome at C 6 level.  She has had therapy in multiple venues but with recent move to [facility], therapy staff recommended she seek outpatient therapy for her needs  PRECAUTIONS: None  SUBJECTIVE:  Pt. reports having a quiet holiday for the 4th of July.  PAIN:  Are you having pain? No     OBJECTIVE:   TODAY'S TREATMENT:       Therapeutic Ex.   Pt. tolerated PROM followed by AROM bilateral wrist extension, PROM for bilateral digit MP, PIP, and DIP flexion,  and extension, thumb radial, and palmar abduction in conjunction with moist heat. BUE strengthening with 1.5# dowel for bilateral shoulder flexion, chest presses, and elbow flexion. Emphasis was placed on left wrist extension, bilateral thumb radial, and palmar abduction, and bilateral digit MP, PIP, and DIP flexion. Pt. worked on [employer], and reciprocal motion using the UBE while seated for 8 min. with no resistance. Constant monitoring was provided. BUE ROM, and strengthening was performed in preparation for functional reaching, and functional hand  use.   Pt. Continues to have a Bandaid over the Biopsy site on the dorsal aspect of the right hand. Moist heat placement was modified to avoid the dorsal of the right hand, and was placed distally at the digits only.  Pt. tolerated BUE strengthening with 1.5# dowel with cues for form and technique.  Pt. continues to tolerate the UBE without difficulty with increasing resistance. Pt. tolerated ROM well without reports of pain, or discomfort following moist heat modality. Pt. presents with tightness at the bilateral thumb webspace. Pt. continues to present with bilateral wrist flexor tightness, and digit MP, PIP, and DIP extensor tightness. Pt. continues to benefit from working on impoving ROM in order to work towards increasing engagement of her bilateral hands during ADLs, and IADL tasks.          PATIENT EDUCATION: Education details: BUE ROM, hand function, UE functioning Person educated: Patient Education method: Customer service manager Education comprehension: verbalized understanding, returned demonstration, and needs further education   HOME EXERCISE PROGRAM Continue with ongoing HEPs     OT Long Term Goals - 07/06/21 1106       OT LONG TERM GOAL #1   Title Patient and caregiver will demonstrate understanding of home exercise program for ROM.    Baseline 06/01/2021: Pt. now has new restorative aides who are assiting with daily ROM at [facility] now. Pt. has recently been in quarantine for COVID-19. Pt.'s current restorative aide is retiring at the end of December. Pt. continues to have a restorative aide rehab aide assist her wih exercises at [facility]. 8/8: Independently completes/directs HEP mulitple times/week. 12/17/2020: Pt. has a new restorative aide who will be resuming ROM program.    Time 12    Period Weeks    Status Achieved      OT LONG TERM GOAL #2   Title Patient will demonstrate ability to don shirt with min assist from seated position.    Baseline Pt. is  indepedent with a larger shirt, and continues to require increased time to complete,    Time 12    Period Weeks    Status Achieved      OT LONG TERM GOAL #4   Title Patient will demonstrate improved composite finger flexion to hold deodorant to apply to underarms.    Baseline 01/12/2021:  Pt. is progressing, and is able to reach to grasp, and hold narrow cones. 10/20/2020: Pt. conitnues to make steady progress with bilateral digit flexion.Pt. continues to present with stiifness/tightness which is hindering pt.'s ability to formulate a full composite fist. Pt. continues to present with improving finger flexion of R hand for a partial gross grasp, but unable to grasp and hold items. pt. conitnues to present with limited digit flexion of L hand. Pt. has active left thumb abduction. 8/8: holds cup with 2 hands, continues to have limited composite finger flexion L worse than R. 02/16/2021: pt. continues to present with limited bilateral gross composite fisting. 04/08/2021: pt. continues to present with  limited digit flexion affecting her ability to formulate bilateral composite fisting. 110th visivt: Pt. is able to hold the utensils in preparation for self-feeding. 06/01/2021: continues to have difficulty formulating a fist, however has improved with self-feeding, and is now using standard utensils.07/06/2021: Pt. continues to have difficulty holding deoderant, and use deoderant. pt. is improving with holding similiar sized objects.    Time 12    Period Weeks    Status On-going    Target Date 09/28/21      Long Term Additional Goals   Additional Long Term Goals Yes      OT LONG TERM GOAL #6   Title Patient will increase right UE ROM to comb the back of her hair with modified independence.    Baseline 01/12/2022: Pt. is able to comb her hair, reaching to the back of her hair.. 10/20/2020: Pt. continues to progress wtith RRUE ROM, and is able to use a pic for the back of her head, however is unable to brush the  back of her hair. 07/30/2020: Pt. is now able to comb the back of her hair with a pic, however continues to work on completing the task efficiently. 8/8: Achieves with great effort, has assistance for efficiancy. 02/16/2021: Pt. is now able to reach, and brush the back of her hair. 04/08/2021: pt. continues to improve reach towards the back of her hair. 110th visit: Pt. is to reach towards the back of her hair, however has difficulty sustaining her arms in elevaton for the entire duration of perfroming hair cre thoroughly. 06/01/2021: Pt. is able to reach to the back of her head, however has difficutly with thoroughness. 07/06/2021: Pt. continues to improve with reaching to the back of her hair, however has difficulty with thoroughly perfroming the task in the back of her hair.    Time 12    Period Weeks    Status Partially Met    Target Date 09/28/21      OT LONG TERM GOAL #8   Title Pt. will write, and sign her name with 100% legibility, and modified independence.    Baseline 01/12/2021: Pt. continues to fill out her daily menu and complete puzzles. 10/20/2020: Pt. continues to make progress overall. Pt. continues to consistently fill out her daily menu, and writing for crossword puzzles. Pt. is able to maintain grasp on a wide width pen. Contineus to work on Media [employer]. 8/8: writes daily, continues to work on improving speed. 02/16/2021: Pt. continues to present wiht limited writing legibility, fluctuating daily. 04/08/2021: Pt. continues to to be able to fill out her daily menu.limited writing legibility for daily puzzles.110th visit: Pt. continues to present with limited legibility, and efficiency for completing daily puzzles. 06/01/2021: pt. continues to work on the consistency with legibility 07/06/2021: Pt. continues to work on improving consistency with legibility.    Time 12    Period Weeks    Status On-going    Target Date 09/28/21      OT LONG TERM GOAL  #9   TITLE Pt. will turn pages in a  book with modified independence.    Baseline 07/06/2021: Pt. has difficulty turning pages.    Time 12    Period Weeks    Status New    Target Date 07/06/21              Plan - 07/22/21 1737     Clinical Impression Statement Pt. Continues to have a Bandaid over the Biopsy site on the dorsal aspect of  the right hand. Moist heat placement was modified to avoid the dorsal of the right hand, and was placed distally at the digits only.  Pt. tolerated BUE strengthening with 1.5# dowel with cues for form and technique.  Pt. continues to tolerate the UBE without difficulty with increasing resistance. Pt. tolerated ROM well without reports of pain, or discomfort following moist heat modality. Pt. presents with tightness at the bilateral thumb webspace. Pt. continues to present with bilateral wrist flexor tightness, and digit MP, PIP, and DIP extensor tightness. Pt. continues to benefit from working on impoving ROM in order to work towards increasing engagement of her bilateral hands during ADLs, and IADL tasks.             OT Occupational Profile and History Detailed Assessment- Review of Records and additional review of physical, cognitive, psychosocial history related to current functional performance    Occupational performance deficits (Please refer to evaluation for details): ADL's;IADL's;Leisure;Social Participation    Body Structure / Function / Physical Skills ADL;Continence;Dexterity;Flexibility;Strength;ROM;Balance;Coordination;FMC;IADL;Endurance;UE functional use;Decreased knowledge of use of DME;GMC    Psychosocial Skills Coping Strategies    Rehab Potential Fair    Clinical Decision Making Several treatment options, min-mod task modification necessary    Comorbidities Affecting Occupational Performance: Presence of comorbidities impacting occupational performance    Comorbidities impacting occupational performance description: contractures of bilateral hands, dependent transfers,     Modification or Assistance to Complete Evaluation  No modification of tasks or assist necessary to complete eval    OT Frequency 2x / week    OT Duration 12 weeks    OT Treatment/Interventions Self-care/ADL training;Cryotherapy;Therapeutic exercise;DME and/or AE instruction;Balance training;Neuromuscular education;Manual Therapy;Splinting;Moist Heat;Passive range of motion;Therapeutic activities;Patient/family education    Plan continue to progress ROM of digits, wrists and shoulders as it pertains to completion of ADL tasks that pt values.    Consulted and Agree with Plan of Care Patient            Harrel Carina, MS, OTR/L  Harrel Carina, OT 08/05/2021, 12:52 PM

## 2021-08-10 ENCOUNTER — Ambulatory Visit: Payer: Medicare HMO | Admitting: Occupational Therapy

## 2021-08-10 ENCOUNTER — Encounter: Payer: Self-pay | Admitting: Occupational Therapy

## 2021-08-10 ENCOUNTER — Ambulatory Visit: Payer: Medicare HMO

## 2021-08-10 DIAGNOSIS — M6281 Muscle weakness (generalized): Secondary | ICD-10-CM | POA: Diagnosis not present

## 2021-08-10 DIAGNOSIS — R278 Other lack of coordination: Secondary | ICD-10-CM

## 2021-08-10 DIAGNOSIS — S14129S Central cord syndrome at unspecified level of cervical spinal cord, sequela: Secondary | ICD-10-CM

## 2021-08-10 DIAGNOSIS — R2689 Other abnormalities of gait and mobility: Secondary | ICD-10-CM

## 2021-08-10 DIAGNOSIS — R262 Difficulty in walking, not elsewhere classified: Secondary | ICD-10-CM

## 2021-08-10 DIAGNOSIS — R269 Unspecified abnormalities of gait and mobility: Secondary | ICD-10-CM

## 2021-08-10 NOTE — Therapy (Addendum)
OUTPATIENT OCCUPATIONAL THERAPY TREATMENT NOTE   Patient Name: Sherry [name] MRN: [id number] DOB:07-16-1934, 87 y.o., female Today's Date: 08/10/2021  PCP: Dr. Viviana Simpler REFERRING PROVIDER: Dr. Viviana Simpler   OT End of Session - 08/10/21 1417     Visit Number [id number]    Number of Visits 154    Date for OT Re-Evaluation 09/28/21    OT Start Time 1105    OT Stop Time 1145    OT Time Calculation (min) 40 min    Activity Tolerance Patient tolerated treatment well    Behavior During Therapy [facility] for tasks assessed/performed             Past Medical History:  Diagnosis Date   Acute blood loss anemia    Arthritis    Cancer ([place])    skin   Central cord syndrome at C6 level of cervical spinal cord ([place]) 11/29/2017   Hypertension    Protein-calorie malnutrition, severe ([place]) 01/24/2018   S/P insertion of IVC (inferior vena caval) filter 01/24/2018   Tetraparesis ([place])    Past Surgical History:  Procedure Laterality Date   ANTERIOR CERVICAL DECOMP/DISCECTOMY FUSION N/A 11/29/2017   Procedure: Cervical five-six, six-seven Anterior Cervical Decompression Fusion;  Surgeon: Judith Part, MD;  Location: [place] [facility];  Service: Neurosurgery;  Laterality: N/A;  Cervical five-six, six-seven Anterior Cervical Decompression Fusion   CATARACT EXTRACTION     IR IVC FILTER PLMT / S&I /IMG GUID/MOD SED  12/08/2017   Patient Active Problem List   Diagnosis Date Noted   Acute appendicitis with appendiceal abscess 05/30/2018   Hypocalcemia 02/15/2018   Dysuria 02/15/2018   Bilateral lower extremity edema 02/11/2018   Vaginal yeast infection 01/30/2018   Chest tightness 01/27/2018   Healthcare-associated pneumonia 01/25/2018   Pleural effusion, not elsewhere classified 01/25/2018   Acute deep vein thrombosis (DVT) of left lower extremity ([place]) 01/24/2018   Acute deep vein thrombosis (DVT) of right lower extremity ([place]) 01/24/2018   Chronic allergic rhinitis 01/24/2018   Depression  with anxiety 01/24/2018   UTI due to Klebsiella species 01/24/2018   Protein-calorie malnutrition, severe ([place]) 01/24/2018   S/P insertion of IVC (inferior vena caval) filter 01/24/2018   Reactive depression    Hypokalemia    Leukocytosis    Essential hypertension    Trauma    Tetraparesis ([facility])    Neuropathic pain    Neurogenic bowel    Neurogenic bladder    Benign essential HTN    Acute blood loss anemia    Central cord syndrome at C6 level of cervical spinal cord ([place]) 11/29/2017   Central cord syndrome ([place]) 11/29/2017      REFERRING DIAG: Central Cord Syndrome at C6 of the Cervical Spinal Cord  THERAPY DIAG:  Muscle weakness (generalized)  Other lack of coordination  Rationale for Evaluation and Treatment Rehabilitation  PERTINENT HISTORY: Patient s/p fall November 29, 2017 resulting in diagnosis of central cord syndrome at C 6 level.  She has had therapy in multiple venues but with recent move to [facility], therapy staff recommended she seek outpatient therapy for her needs  PRECAUTIONS: None  SUBJECTIVE:  Pt. reports having a quiet holiday for the 4th of July.  PAIN:  Are you having pain? No     OBJECTIVE:   TODAY'S TREATMENT:       Therapeutic Ex.   Pt. tolerated PROM followed by AROM bilateral wrist extension, PROM for bilateral digit MP, PIP, and DIP flexion, and extension, thumb radial, and palmar  abduction in conjunction with moist heat. BUE strengthening with 1.5# dowel for bilateral shoulder flexion, chest presses, and elbow flexion. Emphasis was placed on left wrist extension, bilateral thumb radial, and palmar abduction, and bilateral digit MP, PIP, and DIP flexion. Pt. worked on [employer], and reciprocal motion using the UBE while seated for 8 min. with no resistance. Constant monitoring was provided. BUE ROM, and strengthening was performed in preparation for functional reaching, and functional hand use.   Pt. reports having had a quiet  weekend. Pt. continues to tolerate the UBE without difficulty with increasing resistance with increased height of the UBE to encourage increased bilateral shoulder flexion. Pt. tolerated ROM well without reports of pain, or discomfort following moist heat modality. Pt. continues to present with tightness at the bilateral thumb webspace, and bilateral wrist flexor tightness,  as well as digit MP, PIP, and DIP extensor tightness. Pt. continues to benefit from working on impoving ROM in order to work towards increasing engagement of her bilateral hands during ADLs, and IADL tasks.          PATIENT EDUCATION: Education details: BUE ROM, hand function, UE functioning Person educated: Patient Education method: Customer service manager Education comprehension: verbalized understanding, returned demonstration, and needs further education   HOME EXERCISE PROGRAM Continue with ongoing HEPs     OT Long Term Goals - 07/06/21 1106       OT LONG TERM GOAL #1   Title Patient and caregiver will demonstrate understanding of home exercise program for ROM.    Baseline 06/01/2021: Pt. now has new restorative aides who are assiting with daily ROM at [facility] now. Pt. has recently been in quarantine for COVID-19. Pt.'s current restorative aide is retiring at the end of December. Pt. continues to have a restorative aide rehab aide assist her wih exercises at [facility]. 8/8: Independently completes/directs HEP mulitple times/week. 12/17/2020: Pt. has a new restorative aide who will be resuming ROM program.    Time 12    Period Weeks    Status Achieved      OT LONG TERM GOAL #2   Title Patient will demonstrate ability to don shirt with min assist from seated position.    Baseline Pt. is indepedent with a larger shirt, and continues to require increased time to complete,    Time 12    Period Weeks    Status Achieved      OT LONG TERM GOAL #4   Title Patient will demonstrate improved composite finger  flexion to hold deodorant to apply to underarms.    Baseline 01/12/2021:  Pt. is progressing, and is able to reach to grasp, and hold narrow cones. 10/20/2020: Pt. conitnues to make steady progress with bilateral digit flexion.Pt. continues to present with stiifness/tightness which is hindering pt.'s ability to formulate a full composite fist. Pt. continues to present with improving finger flexion of R hand for a partial gross grasp, but unable to grasp and hold items. pt. conitnues to present with limited digit flexion of L hand. Pt. has active left thumb abduction. 8/8: holds cup with 2 hands, continues to have limited composite finger flexion L worse than R. 02/16/2021: pt. continues to present with limited bilateral gross composite fisting. 04/08/2021: pt. continues to present with limited digit flexion affecting her ability to formulate bilateral composite fisting. 110th visivt: Pt. is able to hold the utensils in preparation for self-feeding. 06/01/2021: continues to have difficulty formulating a fist, however has improved with self-feeding, and is now  using standard utensils.07/06/2021: Pt. continues to have difficulty holding deoderant, and use deoderant. pt. is improving with holding similiar sized objects.    Time 12    Period Weeks    Status On-going    Target Date 09/28/21      Long Term Additional Goals   Additional Long Term Goals Yes      OT LONG TERM GOAL #6   Title Patient will increase right UE ROM to comb the back of her hair with modified independence.    Baseline 01/12/2022: Pt. is able to comb her hair, reaching to the back of her hair.. 10/20/2020: Pt. continues to progress wtith RRUE ROM, and is able to use a pic for the back of her head, however is unable to brush the back of her hair. 07/30/2020: Pt. is now able to comb the back of her hair with a pic, however continues to work on completing the task efficiently. 8/8: Achieves with great effort, has assistance for efficiancy.  02/16/2021: Pt. is now able to reach, and brush the back of her hair. 04/08/2021: pt. continues to improve reach towards the back of her hair. 110th visit: Pt. is to reach towards the back of her hair, however has difficulty sustaining her arms in elevaton for the entire duration of perfroming hair cre thoroughly. 06/01/2021: Pt. is able to reach to the back of her head, however has difficutly with thoroughness. 07/06/2021: Pt. continues to improve with reaching to the back of her hair, however has difficulty with thoroughly perfroming the task in the back of her hair.    Time 12    Period Weeks    Status Partially Met    Target Date 09/28/21      OT LONG TERM GOAL #8   Title Pt. will write, and sign her name with 100% legibility, and modified independence.    Baseline 01/12/2021: Pt. continues to fill out her daily menu and complete puzzles. 10/20/2020: Pt. continues to make progress overall. Pt. continues to consistently fill out her daily menu, and writing for crossword puzzles. Pt. is able to maintain grasp on a wide width pen. Contineus to work on [employer]. 8/8: writes daily, continues to work on improving speed. 02/16/2021: Pt. continues to present wiht limited writing legibility, fluctuating daily. 04/08/2021: Pt. continues to to be able to fill out her daily menu.limited writing legibility for daily puzzles.110th visit: Pt. continues to present with limited legibility, and efficiency for completing daily puzzles. 06/01/2021: pt. continues to work on the consistency with legibility 07/06/2021: Pt. continues to work on improving consistency with legibility.    Time 12    Period Weeks    Status On-going    Target Date 09/28/21      OT LONG TERM GOAL  #9   TITLE Pt. will turn pages in a book with modified independence.    Baseline 07/06/2021: Pt. has difficulty turning pages.    Time 12    Period Weeks    Status New    Target Date 07/06/21              Plan - 07/22/21 1737      Clinical Impression Statement Pt. reports having had a quiet Pt. reports having had a quiet weekend. Pt. continues to tolerate the UBE without difficulty with increasing resistance with increased height of the UBE to encourage increased bilateral shoulder flexion. Pt. tolerated ROM well without reports of pain, or discomfort following moist heat modality. Pt. continues to present with  tightness at the bilateral thumb webspace, and bilateral wrist flexor tightness,  as well as digit MP, PIP, and DIP extensor tightness. Pt. continues to benefit from working on impoving ROM in order to work towards increasing engagement of her bilateral hands during ADLs, and IADL tasks.                   OT Occupational Profile and History Detailed Assessment- Review of Records and additional review of physical, cognitive, psychosocial history related to current functional performance    Occupational performance deficits (Please refer to evaluation for details): ADL's;IADL's;Leisure;Social Participation    Body Structure / Function / Physical Skills ADL;Continence;Dexterity;Flexibility;Strength;ROM;Balance;Coordination;FMC;IADL;Endurance;UE functional use;Decreased knowledge of use of DME;GMC    Psychosocial Skills Coping Strategies    Rehab Potential Fair    Clinical Decision Making Several treatment options, min-mod task modification necessary    Comorbidities Affecting Occupational Performance: Presence of comorbidities impacting occupational performance    Comorbidities impacting occupational performance description: contractures of bilateral hands, dependent transfers,    Modification or Assistance to Complete Evaluation  No modification of tasks or assist necessary to complete eval    OT Frequency 2x / week    OT Duration 12 weeks    OT Treatment/Interventions Self-care/ADL training;Cryotherapy;Therapeutic exercise;DME and/or AE instruction;Balance training;Neuromuscular education;Manual  Therapy;Splinting;Moist Heat;Passive range of motion;Therapeutic activities;Patient/family education    Plan continue to progress ROM of digits, wrists and shoulders as it pertains to completion of ADL tasks that pt values.    Consulted and Agree with Plan of Care Patient            Harrel Carina, MS, OTR/L  Harrel Carina, OT 08/10/2021, 2:20 PM

## 2021-08-10 NOTE — Therapy (Signed)
OUTPATIENT PHYSICAL THERAPY TREATMENT NOTE   Patient Name: Sherry [name] MRN: [id number] DOB:[birth year], 87 y.o., female Today's Date: 08/10/2021  PCP: Dr. Viviana Simpler REFERRING PROVIDER: Dr. Viviana Simpler   PT End of Session - 08/10/21 1213     Visit Number 158    Number of Visits 172    Date for PT Re-Evaluation 09/16/21    Authorization Type [employer] FOTO performed by PT on eval (7/20), score 12, Progress note on 03/10/2020; PN on 08/20/2020    Authorization Time Period Recert 06/10/7414- 3/84/5364; PN on 07/10/319; Recert 03/27/8248- 03/70/4888; PN on 10/17/9448; Recert 38/88/2800- 3/49/1791; Recert 06/05/6977-4/80/1655; Recert 3/74/8270- 7/86/7544    Progress Note Due on Visit 160    PT Start Time 1145    PT Stop Time 1227    PT Time Calculation (min) 42 min    Equipment Utilized During Treatment Gait belt    Activity Tolerance Patient tolerated treatment well;Patient limited by fatigue    Behavior During Therapy WFL for tasks assessed/performed             Past Medical History:  Diagnosis Date   Acute blood loss anemia    Arthritis    Cancer ([place])    skin   Central cord syndrome at C6 level of cervical spinal cord ([place]) 11/29/2017   Hypertension    Protein-calorie malnutrition, severe ([place]) 01/24/2018   S/P insertion of IVC (inferior vena caval) filter 01/24/2018   Tetraparesis ([place])    Past Surgical History:  Procedure Laterality Date   ANTERIOR CERVICAL DECOMP/DISCECTOMY FUSION N/A 11/29/2017   Procedure: Cervical five-six, six-seven Anterior Cervical Decompression Fusion;  Surgeon: Judith Part, MD;  Location: [facility];  Service: Neurosurgery;  Laterality: N/A;  Cervical five-six, six-seven Anterior Cervical Decompression Fusion   CATARACT EXTRACTION     IR IVC FILTER PLMT / S&I /IMG GUID/MOD SED  12/08/2017   Patient Active Problem List   Diagnosis Date Noted   Acute appendicitis with appendiceal abscess 05/30/2018   Hypocalcemia 02/15/2018    Dysuria 02/15/2018   Bilateral lower extremity edema 02/11/2018   Vaginal yeast infection 01/30/2018   Chest tightness 01/27/2018   Healthcare-associated pneumonia 01/25/2018   Pleural effusion, not elsewhere classified 01/25/2018   Acute deep vein thrombosis (DVT) of left lower extremity ([place]) 01/24/2018   Acute deep vein thrombosis (DVT) of right lower extremity ([place]) 01/24/2018   Chronic allergic rhinitis 01/24/2018   Depression with anxiety 01/24/2018   UTI due to Klebsiella species 01/24/2018   Protein-calorie malnutrition, severe ([place]) 01/24/2018   S/P insertion of IVC (inferior vena caval) filter 01/24/2018   Reactive depression    Hypokalemia    Leukocytosis    Essential hypertension    Trauma    Tetraparesis ([facility])    Neuropathic pain    Neurogenic bowel    Neurogenic bladder    Benign essential HTN    Acute blood loss anemia    Central cord syndrome at C6 level of cervical spinal cord ([place]) 11/29/2017   Central cord syndrome ([place]) 11/29/2017    REFERRING DIAG: Central cord syndrome at C6 level of cervical spinal cord, sequela ([place])  THERAPY DIAG:  Muscle weakness (generalized)  Other lack of coordination  Central cord syndrome, sequela ([facility])  Abnormality of gait and mobility  Difficulty in walking, not elsewhere classified  Other abnormalities of gait and mobility  Rationale for Evaluation and Treatment Rehabilitation  PERTINENT HISTORY: Pt is an 87 yo female that fell in 2019, fractured vertebrae in her  neck and in her low back per family. 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 After hospital stay, pt discharged to [facility] for ~1 month, experienced severe UTI as well as bilateral DVT (IVC filter placed). Discharged to [facility], stayed for about a year, and then moved to [facility] in April 2021. Was receiving PT, but per family facility reported that did not have adequate equipment to maximize PT for her. Pt until about 1 month ago was  practicing sit to stand transfers with 1-2 people, and working on static standing. Has a brace for L foot due to PF. Pt currently needs assistance with all ADLs (able to assist with donning/doffing her shirt), bed baths, and needs a hoyer lift for transfers. Able to drive power wheelchair without assistance.   PRECAUTIONS: Fall  SUBJECTIVE: Patient reports had a good weekend and feeling okay today.    PAIN:  Are you having pain? No     TODAY'S TREATMENT:   Therapeutic activities:  Stand pivot transfer: Power w/c to [employer] assist and patient able to pivot/turn right LE - gait belt and patient holding onto PT  arms.  Patient able to pivot transfer with pivot/move Right LE on her own and requires mod assist to position back into seat of Nustep. Increased time to position patient safely including use of seat belt; Left foot harness, and UE/LE and seat position.  Therex:   Nustep - BUE/LE  with 8 min set up with positioning including scooting hips back; seat belt, Left LE strap, and added BLE abductor braces today to help keep legs aligned. Patient able to use B arms as well. Patient was monitored continuously for muscle and cardiovascular response. Level 0- 72mn Level 2- 2 min Level 3- 2 min Level 4- 3 min Level 0 - 180m Level 2 - 1 min Level 3-  1 min Level 4 - 1 min Level 2- 45m43mLevel 0- 1 min Total time= 15 miin = VC to keep SPC > 50  Total Distance= 0.40 mi    Several min to assist with doff equipment and position patient     Education provided throughout session via VC/TC and demonstration to facilitate movement at target joints and correct muscle activation for all testing and exercises performed.      PATIENT EDUCATION: Education details: Standing posture Person educated: Patient Education method: Explanation, Demonstration, Tactile cues, and Verbal cues Education comprehension: verbalized understanding, returned demonstration, verbal cues required, tactile  cues required, and needs further education   HOME EXERCISE PROGRAM: No updates this session   PT Short Term Goals -       PT SHORT TERM GOAL #1   Title The patient will perform initial HEP with minimum assistance in order to improve strength and function.    Baseline Patient demonstrating independence with initial HEP as of 03/10/2020 with no questions or difficulty.    Time 4    Period Weeks    Status Achieved    Target Date 02/11/20              PT Long Term Goals -       PT LONG TERM GOAL #1   Title The patient will be compliant with finalized HEP with minimum assistance in preparation for self management and maintenance of condition.    Baseline 03/10/2020- Patient familiar with initial HEP and will keep goal active as HEP becomes more progressive including possible transfers and standing activities. 04/07/2020- Patient continues to report compliance  with LE strengthening home exercises without questions or concerns. 04/21/2020- Patient able to verbalize and demonstrate good understanding of current HEP including some supine and seated LE strengthening exercises. Reviewed and patient performed 10 reps today with minimal cueing. Will keep goal active to progress HEP as appropriate. 05/28/2020- patient reports continues to perform LE strengthening as able and some help from caregiver as able. No changes at this time to home program as patient is limited to supine/seated therex. 07/02/2020- Patient continues to report compliance with current supine and seated LE strengtheing home program and states no questions or concerns regarding her plan.    Time 12    Period Weeks    Status Achieved      PT LONG TERM GOAL #2   Title The patient will demonstrate at least 10 point improvement on FOTO score indicating an improved ability to perform functional activities.    Baseline on eval (/720) score 12; 10/22/19: 12; 12/10/19: 18, 12/13: 18: 04/07/2020- Will assess next visit. 07/02/2020- Will obtain  next visit. 09/24/2020= Will obtain next visit. 11/12/2020= 12; 12/22/2020= 12; 03/30/2021= 17; 06/24/2021= Will assess next visit    Time 12    Period Weeks    Status On-going    Target Date 09/16/21      PT LONG TERM GOAL #3   Title The patient will demonstrate lateral scooting  for assistance with transfers with minA to maximize independence.    Baseline 10/22/19: Pt requires modA+1 for lateral scooting; 12/10/19: modA+1, 12/13: modA-maxA, 12/27: modA-maxA, 03/10/2020- ModA-MaxA- increased verbal cues and visual demonstration. 04/07/2020- Continued Max assist to perform forward/lateral scoot. 04/21/2020- Patient was able to demo slight lateral scoot with CGA approx 12 in then fatigued requiring max assist- she is able to scoot forward by leaning without significant issues. 05/28/2020- While sitting in chair- patient able to perform forward and lateral scoot with VC/visual demo and increased time allowed- CGA today but not consistent yet- will keep goal active at this time to continue to focus on strengthening and improving technique. 07/02/2020- Patient able to demonstrate minimal ability to laterally shift weigh on mat table requiring max assist yet is able to forward scoot out to edge of mat table with min Assist. 08/20/2020 - Patient was able to demonstrate minimal lateral scooting at edge of mat while holding feet still so they did not slip on tile surface. She continues to be able to demonstrate scoot forward and uses tilt option to recline to position safely back into seat of power wheelchair. 09/25/2020- Patient able to sit on mat and minimally scoot laterally but requires min/mod A for efficient scooting. Will end goal at this time due to plateau in progress with scooting over extended time.    Time 12    Period Weeks    Status Not Met      PT LONG TERM GOAL #4   Title The patient will demonstrate a squat pivot transfer with minimum assistance to maximize independence and mobility.    Baseline 10/22/19:  unable to safely attempt at this time; 12/10/19: unable to safely attempt at this time, 01/14/20: unable to safety attempt at this time. 03/10/2020- Patient able to perform Stand pivot transfer with Maximal assist. 04/07/2020- Patient continues to require Max assist with sit to stand- will assist SPT next visit. 04/21/2020- Patient presents with Max assist to perform Sit to stand Transfer- unable to pivot or move Left LE well without difficulty. 07/02/2020= Max assist with SPT and minimal ability to pivot. 7/202/2022-Patient able  to stand with minimal assist this visit (as good as CGA last visit) and able to move right foot to pivot and performing with moderate assist today. 09/24/2020- Patient able to perform sit to stand with Min assist from lift chair. Patient has demo inconsistency and will keep goal active.  12/17/2020- Goal is currently not appropriate as patient fractured her ankle and currently NWB. 12/22/2020=Goal is currently not appropriate as patient fractured her ankle and currently NWB.  03/30/2021=Patient was able to demo min assist to stand holding onto PT back of arms yet continues to require max assist to pivot as she was unable to pivot or turn her legs consistently today. 06/24/2021=Able to stand with varying ability (anywhere from min/mod/max A) - last visit able to stand pivot transfer and move right LE as previously unable. 07/06/2021- Patient continues to stand with mod assist and able to move Right LE to pivot to right from w/c to mat table.    Time 12    Period Weeks    Status On-going    Target Date 09/16/21      PT LONG TERM GOAL #5   Title The patient will demonstrate sitting without UE support for 2-5 minutes at EOB to improve participation and maximize independence with ADLs.    Baseline 10/22/19: Pt able to sit unsupported for at least 2 minutes at edge of bed. 04/21/2020- Patient continues to demo good sitting at edge of mat > 5 miin without difficulty or back support.    Time 12     Period Weeks    Status Achieved      PT LONG TERM GOAL #6   Title Patient will demonstrate improved functional LE strength as seen by consistent ability to stand > 2 min (3 out of 4 trials) with Mod/Max A for improved LE strength with transfers.    Baseline 05/28/2020- Patient able to inconsistent stand 1-2 min right now in //bars with Mod/Max A. 07/02/2020- Patient was able to progress last 2 visit to standing using bilateral platform attachment between 1-2 min which is a progression from the parallel bars. 08/20/2020- Patient performed static standing with min- mod A using bilateral platform attachment on walker for 6 min 30 sec today with assist for trunk control and intermittent verbal cues for posture. 09/24/2020- Patient has consistently been able to stand >2 min in // bars and when using bilateral Platform walker. 12/23/2020= Will reopen this goal as patient should transition back to weightbearing and again practice standing in new cert. 03/30/2021=1) 1 min 23 sec   2)  58mn 40 sec  3) 1 min 50 sec   Left LE hyperextended with increased difficulty tucking/extending hips today. Requiring more mod assist to remain standing. 06/24/2021=Patient was able to stand 5 trials and able to stand > 2 min for 1 of the 5 trials. 1) 1 min 40 sec 2) 2 min 38 sec 3) 38 sec (difficulty adjusting in standing and reported left LE discomfort 4) 1 min 28 sec 5) 1 min 35 sec  -rest break between each trial. However- Patient did demonstrate less overall UE support and more erect posture with ability to maintain extended hips over feet as previously much more difficult to maintain.    Time 12    Period Weeks    Status On-going    Target Date 09/15/21      PT LONG TERM GOAL #7   Title Patient/caregiver will demonstrate assisted transfer using [place] Lift consistently Independently for improved ability to transfer  at home from lift chair to bed.    Baseline 09/24/2020- Patient requires hoyer lift (Dependent) for safe transfers.   12/17/2020- Patient currently still dependent on hoyer as she is currently non-weight bearing due to recent ankle fracture. 03/30/2021- Patient stated she has not tried. Agreeable to attempt in near future. 06/24/2021= Patient continues to rely on hoyer transfers at home.    Time 12    Period Weeks    Status Deferred    Target Date 06/22/21      PT LONG TERM GOAL #8   Title Patient will demo improved right LE quad strength as seen by ability to extend knee to 20 deg from 0 or less while performing a Long arc quad (for improved terminal knee ext and more full ROM)  for improved ability to bearing weight with Right LE.    Baseline 12/22/2020=able to extend knee to approx 35 deg from zero during long arc quad; 2/27/203- WIll reassess next visit; 5/232023=23 deg from zero    Time 12    Period Weeks    Status On-going    Target Date 09/16/21      PT LONG TERM GOAL  #9   TITLE Patient will ambulate > 100 feet using LiteGait exhibiting reciprocal steps to prepare for more weighted ambulation in future.    Baseline 06/24/2021- Patient recently on 06/22/2021 ambulated approx 75 feet with litegait - but visits before only approx 35 feet. 07/06/2021- Patient able to ambulate approx 50 feet today using litegait and 2 person assist.    Time 12    Period Weeks    Status On-going    Target Date 09/16/21              Plan     Clinical Impression Statement Patient performed well again today using Nustep for LE strengthening including good response to using Leg abd Braces to keep knees in better alingment. She was able to complete more resistance today and stated very fatigued at end of session.   Patient will benefit from skilled Physical Therapy services to assist in improving functional mobility and strength to improve patient's quality of life.    Personal Factors and Comorbidities Age;Time since onset of injury/illness/exacerbation;Comorbidity 3+;Fitness    Comorbidities HTN, quadriparesis, history of  DVT, neurogenic bladder    Examination-Activity Limitations Bathing;Hygiene/Grooming;Squat;Bed Mobility;Lift;Bend;Stand;Engineer, manufacturing;Toileting;Self Feeding;Transfers;Continence;Sit;Dressing;Sleep;Carry    Examination-Participation Restrictions Church;Laundry;Cleaning;Medication Management;Community Activity;Meal Prep;Interpersonal Relationship    Stability/Clinical Decision Making Evolving/Moderate complexity    Rehab Potential Fair    PT Frequency 2x / week    PT Duration 12 weeks    PT Treatment/Interventions ADLs/Self Care Home Management;Electrical Stimulation;Therapeutic activities;Wheelchair mobility training;Vasopneumatic Device;Joint Manipulations;Vestibular;Passive range of motion;Patient/family education;Therapeutic exercise;DME Instruction;Biofeedback;Aquatic Therapy;Moist Heat;Gait training;Balance training;Orthotic Fit/Training;Dry needling;Energy conservation;Taping;Splinting;Neuromuscular re-education;Cryotherapy;Ultrasound;Functional mobility training    PT Next Visit Plan Continue with progressive standing/transfer training; Progress LE Strengthening. Gait training with lite gait. Obtain latest FOTO next visit    PT Home Exercise Plan no changes    Consulted and Agree with Plan of Care Patient               Lewis Moccasin, PT 08/10/2021, 2:23 PM

## 2021-08-11 DIAGNOSIS — I82409 Acute embolism and thrombosis of unspecified deep veins of unspecified lower extremity: Secondary | ICD-10-CM | POA: Diagnosis not present

## 2021-08-11 DIAGNOSIS — K592 Neurogenic bowel, not elsewhere classified: Secondary | ICD-10-CM | POA: Diagnosis not present

## 2021-08-11 DIAGNOSIS — F39 Unspecified mood [affective] disorder: Secondary | ICD-10-CM | POA: Diagnosis not present

## 2021-08-11 DIAGNOSIS — M959 Acquired deformity of musculoskeletal system, unspecified: Secondary | ICD-10-CM

## 2021-08-11 DIAGNOSIS — G8250 Quadriplegia, unspecified: Secondary | ICD-10-CM | POA: Diagnosis not present

## 2021-08-11 DIAGNOSIS — M810 Age-related osteoporosis without current pathological fracture: Secondary | ICD-10-CM

## 2021-08-12 ENCOUNTER — Ambulatory Visit: Payer: Medicare HMO

## 2021-08-12 ENCOUNTER — Ambulatory Visit: Payer: Medicare HMO | Admitting: Occupational Therapy

## 2021-08-12 DIAGNOSIS — R2689 Other abnormalities of gait and mobility: Secondary | ICD-10-CM

## 2021-08-12 DIAGNOSIS — R269 Unspecified abnormalities of gait and mobility: Secondary | ICD-10-CM

## 2021-08-12 DIAGNOSIS — S7222XA Displaced subtrochanteric fracture of left femur, initial encounter for closed fracture: Secondary | ICD-10-CM | POA: Diagnosis not present

## 2021-08-12 DIAGNOSIS — R278 Other lack of coordination: Secondary | ICD-10-CM

## 2021-08-12 DIAGNOSIS — S14129S Central cord syndrome at unspecified level of cervical spinal cord, sequela: Secondary | ICD-10-CM

## 2021-08-12 DIAGNOSIS — M6281 Muscle weakness (generalized): Secondary | ICD-10-CM

## 2021-08-12 DIAGNOSIS — R262 Difficulty in walking, not elsewhere classified: Secondary | ICD-10-CM

## 2021-08-12 DIAGNOSIS — R2681 Unsteadiness on feet: Secondary | ICD-10-CM

## 2021-08-12 NOTE — Therapy (Signed)
OUTPATIENT OCCUPATIONAL THERAPY TREATMENT NOTE   Patient Name: Sherry [name] MRN: [id number] DOB:[birth year], 87 y.o., female Today's Date: 08/12/2021  PCP: Dr. Viviana Simpler REFERRING PROVIDER: Dr. Viviana Simpler   OT End of Session - 08/12/21 1741     Visit Number [id number]    Number of Visits 154    Date for OT Re-Evaluation 09/28/21    Authorization Time Period Progress report period starting 06/01/2021    OT Start Time 1100    OT Stop Time 1145    OT Time Calculation (min) 45 min    Activity Tolerance Patient tolerated treatment well    Behavior During Therapy [facility] for tasks assessed/performed             Past Medical History:  Diagnosis Date   Acute blood loss anemia    Arthritis    Cancer ([place])    skin   Central cord syndrome at C6 level of cervical spinal cord ([place]) 11/29/2017   Hypertension    Protein-calorie malnutrition, severe ([place]) 01/24/2018   S/P insertion of IVC (inferior vena caval) filter 01/24/2018   Tetraparesis ([place])    Past Surgical History:  Procedure Laterality Date   ANTERIOR CERVICAL DECOMP/DISCECTOMY FUSION N/A 11/29/2017   Procedure: Cervical five-six, six-seven Anterior Cervical Decompression Fusion;  Surgeon: Judith Part, MD;  Location: [place];  Service: Neurosurgery;  Laterality: N/A;  Cervical five-six, six-seven Anterior Cervical Decompression Fusion   CATARACT EXTRACTION     IR IVC FILTER PLMT / S&I /IMG GUID/MOD SED  12/08/2017   Patient Active Problem List   Diagnosis Date Noted   Acute appendicitis with appendiceal abscess 05/30/2018   Hypocalcemia 02/15/2018   Dysuria 02/15/2018   Bilateral lower extremity edema 02/11/2018   Vaginal yeast infection 01/30/2018   Chest tightness 01/27/2018   Healthcare-associated pneumonia 01/25/2018   Pleural effusion, not elsewhere classified 01/25/2018   Acute deep vein thrombosis (DVT) of left lower extremity ([place]) 01/24/2018   Acute deep vein thrombosis (DVT) of right lower extremity  ([place]) 01/24/2018   Chronic allergic rhinitis 01/24/2018   Depression with anxiety 01/24/2018   UTI due to Klebsiella species 01/24/2018   Protein-calorie malnutrition, severe ([place]) 01/24/2018   S/P insertion of IVC (inferior vena caval) filter 01/24/2018   Reactive depression    Hypokalemia    Leukocytosis    Essential hypertension    Trauma    Tetraparesis ([facility])    Neuropathic pain    Neurogenic bowel    Neurogenic bladder    Benign essential HTN    Acute blood loss anemia    Central cord syndrome at C6 level of cervical spinal cord ([place]) 11/29/2017   Central cord syndrome ([place]) 11/29/2017      REFERRING DIAG: Central Cord Syndrome at C6 of the Cervical Spinal Cord  THERAPY DIAG:  Muscle weakness (generalized)  Other lack of coordination  Rationale for Evaluation and Treatment Rehabilitation  PERTINENT HISTORY: Patient s/p fall November 29, 2017 resulting in diagnosis of central cord syndrome at C 6 level.  She has had therapy in multiple venues but with recent move to [facility], therapy staff recommended she seek outpatient therapy for her needs  PRECAUTIONS: None  SUBJECTIVE:   Pt. Reports doing well today  PAIN:  Are you having pain? No     OBJECTIVE:   TODAY'S TREATMENT:       Therapeutic Ex.   Pt. tolerated PROM followed by AROM bilateral wrist extension, PROM for bilateral digit MP, PIP, and DIP flexion,  and extension, thumb radial, and palmar abduction in conjunction with moist heat. BUE strengthening with 2# dowel for bilateral shoulder flexion, chest presses, and elbow flexion. 1# dumbbell ex. for elbow flexion and extension, and  forearm supination/pronation.Pt. requires rest breaks and verbal cues for proper technique.  Emphasis was placed on left wrist extension, bilateral thumb radial, and palmar abduction, and bilateral digit MP, PIP, and DIP flexion. Pt. worked on [employer], and reciprocal motion using the UBE while seated for 8 min. with no  resistance. Constant monitoring was provided. BUE ROM, and strengthening was performed in preparation for functional reaching, and functional hand use.   Pt. Reports that the restorative nursing  ROM program has been very inconsistent  with performing her BUE ROM. Pt. continues to tolerate the UBE without difficulty with increasing resistance with increased height of the UBE to encourage increased bilateral shoulder flexion. Pt. tolerated ROM well without reports of pain, or discomfort following moist heat modality.  Pt. Tolerated the bilateral dowel, and dumbbell weights well. Pt. continues to present with tightness at the bilateral thumb webspace, and bilateral wrist flexor tightness,  as well as digit MP, PIP, and DIP extensor tightness. Pt. continues to benefit from working on impoving ROM in order to work towards increasing engagement of her bilateral hands during ADLs, and IADL tasks.          PATIENT EDUCATION: Education details: BUE ROM, hand function, UE functioning Person educated: Patient Education method: Customer service manager Education comprehension: verbalized understanding, returned demonstration, and needs further education   HOME EXERCISE PROGRAM Continue with ongoing HEPs     OT Long Term Goals - 07/06/21 1106       OT LONG TERM GOAL #1   Title Patient and caregiver will demonstrate understanding of home exercise program for ROM.    Baseline 06/01/2021: Pt. now has new restorative aides who are assiting with daily ROM at [facility] now. Pt. has recently been in quarantine for COVID-19. Pt.'s current restorative aide is retiring at the end of December. Pt. continues to have a restorative aide rehab aide assist her wih exercises at [facility]. 8/8: Independently completes/directs HEP mulitple times/week. 12/17/2020: Pt. has a new restorative aide who will be resuming ROM program.    Time 12    Period Weeks    Status Achieved      OT LONG TERM GOAL #2   Title  Patient will demonstrate ability to don shirt with min assist from seated position.    Baseline Pt. is indepedent with a larger shirt, and continues to require increased time to complete,    Time 12    Period Weeks    Status Achieved      OT LONG TERM GOAL #4   Title Patient will demonstrate improved composite finger flexion to hold deodorant to apply to underarms.    Baseline 01/12/2021:  Pt. is progressing, and is able to reach to grasp, and hold narrow cones. 10/20/2020: Pt. conitnues to make steady progress with bilateral digit flexion.Pt. continues to present with stiifness/tightness which is hindering pt.'s ability to formulate a full composite fist. Pt. continues to present with improving finger flexion of R hand for a partial gross grasp, but unable to grasp and hold items. pt. conitnues to present with limited digit flexion of L hand. Pt. has active left thumb abduction. 8/8: holds cup with 2 hands, continues to have limited composite finger flexion L worse than R. 02/16/2021: pt. continues to present with limited  bilateral gross composite fisting. 04/08/2021: pt. continues to present with limited digit flexion affecting her ability to formulate bilateral composite fisting. 110th visivt: Pt. is able to hold the utensils in preparation for self-feeding. 06/01/2021: continues to have difficulty formulating a fist, however has improved with self-feeding, and is now using standard utensils.07/06/2021: Pt. continues to have difficulty holding deoderant, and use deoderant. pt. is improving with holding similiar sized objects.    Time 12    Period Weeks    Status On-going    Target Date 09/28/21      Long Term Additional Goals   Additional Long Term Goals Yes      OT LONG TERM GOAL #6   Title Patient will increase right UE ROM to comb the back of her hair with modified independence.    Baseline 01/12/2022: Pt. is able to comb her hair, reaching to the back of her hair.. 10/20/2020: Pt. continues to  progress wtith RRUE ROM, and is able to use a pic for the back of her head, however is unable to brush the back of her hair. 07/30/2020: Pt. is now able to comb the back of her hair with a pic, however continues to work on completing the task efficiently. 8/8: Achieves with great effort, has assistance for efficiancy. 02/16/2021: Pt. is now able to reach, and brush the back of her hair. 04/08/2021: pt. continues to improve reach towards the back of her hair. 110th visit: Pt. is to reach towards the back of her hair, however has difficulty sustaining her arms in elevaton for the entire duration of perfroming hair cre thoroughly. 06/01/2021: Pt. is able to reach to the back of her head, however has difficutly with thoroughness. 07/06/2021: Pt. continues to improve with reaching to the back of her hair, however has difficulty with thoroughly perfroming the task in the back of her hair.    Time 12    Period Weeks    Status Partially Met    Target Date 09/28/21      OT LONG TERM GOAL #8   Title Pt. will write, and sign her name with 100% legibility, and modified independence.    Baseline 01/12/2021: Pt. continues to fill out her daily menu and complete puzzles. 10/20/2020: Pt. continues to make progress overall. Pt. continues to consistently fill out her daily menu, and writing for crossword puzzles. Pt. is able to maintain grasp on a wide width pen. Contineus to work on [employer]. 8/8: writes daily, continues to work on improving speed. 02/16/2021: Pt. continues to present wiht limited writing legibility, fluctuating daily. 04/08/2021: Pt. continues to to be able to fill out her daily menu.limited writing legibility for daily puzzles.110th visit: Pt. continues to present with limited legibility, and efficiency for completing daily puzzles. 06/01/2021: pt. continues to work on the consistency with legibility 07/06/2021: Pt. continues to work on improving consistency with legibility.    Time 12    Period Weeks     Status On-going    Target Date 09/28/21      OT LONG TERM GOAL  #9   TITLE Pt. will turn pages in a book with modified independence.    Baseline 07/06/2021: Pt. has difficulty turning pages.    Time 12    Period Weeks    Status New    Target Date 07/06/21              Plan - 07/22/21 1737     Clinical Impression Statement Pt. Reports that the restorative  nursing  ROM program has been very inconsistent  with performing her BUE ROM. Pt. continues to tolerate the UBE without difficulty with increasing resistance with increased height of the UBE to encourage increased bilateral shoulder flexion. Pt. tolerated ROM well without reports of pain, or discomfort following moist heat modality.  Pt. Tolerated the bilateral dowel, and dumbbell weights well. Pt. continues to present with tightness at the bilateral thumb webspace, and bilateral wrist flexor tightness,  as well as digit MP, PIP, and DIP extensor tightness. Pt. continues to benefit from working on impoving ROM in order to work towards increasing engagement of her bilateral hands during ADLs, and IADL tasks.                     OT Occupational Profile and History Detailed Assessment- Review of Records and additional review of physical, cognitive, psychosocial history related to current functional performance    Occupational performance deficits (Please refer to evaluation for details): ADL's;IADL's;Leisure;Social Participation    Body Structure / Function / Physical Skills ADL;Continence;Dexterity;Flexibility;Strength;ROM;Balance;Coordination;FMC;IADL;Endurance;UE functional use;Decreased knowledge of use of DME;GMC    Psychosocial Skills Coping Strategies    Rehab Potential Fair    Clinical Decision Making Several treatment options, min-mod task modification necessary    Comorbidities Affecting Occupational Performance: Presence of comorbidities impacting occupational performance    Comorbidities impacting occupational  performance description: contractures of bilateral hands, dependent transfers,    Modification or Assistance to Complete Evaluation  No modification of tasks or assist necessary to complete eval    OT Frequency 2x / week    OT Duration 12 weeks    OT Treatment/Interventions Self-care/ADL training;Cryotherapy;Therapeutic exercise;DME and/or AE instruction;Balance training;Neuromuscular education;Manual Therapy;Splinting;Moist Heat;Passive range of motion;Therapeutic activities;Patient/family education    Plan continue to progress ROM of digits, wrists and shoulders as it pertains to completion of ADL tasks that pt values.    Consulted and Agree with Plan of Care Patient            Harrel Carina, MS, OTR/L  Harrel Carina, OT 08/12/2021, 5:43 PM

## 2021-08-12 NOTE — Therapy (Signed)
OUTPATIENT PHYSICAL THERAPY TREATMENT NOTE   Patient Name: Sherry [name] MRN: [id number] DOB:[birth year], 87 y.o., female Today's Date: 08/12/2021  PCP: Dr. Viviana Simpler REFERRING PROVIDER: Dr. Viviana Simpler   PT End of Session - 08/12/21 1802     Visit Number 159    Number of Visits 172    Date for PT Re-Evaluation 09/16/21    Authorization Type [employer] FOTO performed by PT on eval (7/20), score 12, Progress note on 03/10/2020; PN on 08/20/2020    Authorization Time Period Recert 10/08/4161- 8/45/3646; PN on 8/0/3212; Recert 2/48/2500- 37/05/8887; PN on 1/69/4503; Recert 88/82/8003- 4/91/7915; Recert 0/56/9794-09/02/6551; Recert 7/48/2707- 8/67/5449    Progress Note Due on Visit 160    PT Start Time 1145    PT Stop Time 1220    PT Time Calculation (min) 35 min    Equipment Utilized During Treatment Gait belt    Activity Tolerance Patient tolerated treatment well;Patient limited by fatigue    Behavior During Therapy WFL for tasks assessed/performed             Past Medical History:  Diagnosis Date   Acute blood loss anemia    Arthritis    Cancer ([place])    skin   Central cord syndrome at C6 level of cervical spinal cord ([place]) 11/29/2017   Hypertension    Protein-calorie malnutrition, severe ([place]) 01/24/2018   S/P insertion of IVC (inferior vena caval) filter 01/24/2018   Tetraparesis ([place])    Past Surgical History:  Procedure Laterality Date   ANTERIOR CERVICAL DECOMP/DISCECTOMY FUSION N/A 11/29/2017   Procedure: Cervical five-six, six-seven Anterior Cervical Decompression Fusion;  Surgeon: Judith Part, MD;  Location: [facility];  Service: Neurosurgery;  Laterality: N/A;  Cervical five-six, six-seven Anterior Cervical Decompression Fusion   CATARACT EXTRACTION     IR IVC FILTER PLMT / S&I /IMG GUID/MOD SED  12/08/2017   Patient Active Problem List   Diagnosis Date Noted   Acute appendicitis with appendiceal abscess 05/30/2018   Hypocalcemia 02/15/2018    Dysuria 02/15/2018   Bilateral lower extremity edema 02/11/2018   Vaginal yeast infection 01/30/2018   Chest tightness 01/27/2018   Healthcare-associated pneumonia 01/25/2018   Pleural effusion, not elsewhere classified 01/25/2018   Acute deep vein thrombosis (DVT) of left lower extremity ([place]) 01/24/2018   Acute deep vein thrombosis (DVT) of right lower extremity ([place]) 01/24/2018   Chronic allergic rhinitis 01/24/2018   Depression with anxiety 01/24/2018   UTI due to Klebsiella species 01/24/2018   Protein-calorie malnutrition, severe ([place]) 01/24/2018   S/P insertion of IVC (inferior vena caval) filter 01/24/2018   Reactive depression    Hypokalemia    Leukocytosis    Essential hypertension    Trauma    Tetraparesis ([facility])    Neuropathic pain    Neurogenic bowel    Neurogenic bladder    Benign essential HTN    Acute blood loss anemia    Central cord syndrome at C6 level of cervical spinal cord ([place]) 11/29/2017   Central cord syndrome ([place]) 11/29/2017    REFERRING DIAG: Central cord syndrome at C6 level of cervical spinal cord, sequela ([place])  THERAPY DIAG:  Other lack of coordination  Muscle weakness (generalized)  Central cord syndrome, sequela ([facility])  Abnormality of gait and mobility  Difficulty in walking, not elsewhere classified  Other abnormalities of gait and mobility  Unsteadiness on feet  Rationale for Evaluation and Treatment Rehabilitation  PERTINENT HISTORY: Pt is an 87 yo female that fell in 2019,  fractured vertebrae in her neck and in her low back per family. 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 After hospital stay, pt discharged to [facility] for ~1 month, experienced severe UTI as well as bilateral DVT (IVC filter placed). Discharged to [facility], stayed for about a year, and then moved to [facility] in April 2021. Was receiving PT, but per family facility reported that did not have adequate equipment to maximize PT for her. Pt until  about 1 month ago was practicing sit to stand transfers with 1-2 people, and working on static standing. Has a brace for L foot due to PF. Pt currently needs assistance with all ADLs (able to assist with donning/doffing her shirt), bed baths, and needs a hoyer lift for transfers. Able to drive power wheelchair without assistance.   PRECAUTIONS: Fall  SUBJECTIVE: Patient reports no changes since last visit. States agreeable to try to walk using LiteGait today.  PAIN:  Are you having pain? No     TODAY'S TREATMENT:   Therapeutic activities:  Stand pivot transfer: Power w/c to [employer] table - Mod assist to stand and pivot/turn right LE - gait belt and patient holding onto PT  arms.  Increased time to don harness for litegait with multiple rolling on mat. Once adjusted - able to raise lift and patient stood however- issues with harness the harness rose up her body so had her sit back down and readjusted all staps to try again.   Patient ambulated approx 30 feet total including a 180 wide turn with increased difficulty advancing right LE requiring more physical assist to advance leg despite being unweighed. She was able to mostly exhibit step to gait but increased overall difficulty today.   Several min to assist with doff equipment and position patient back into power w/c with assist of her cargiver.     Education provided throughout session via VC/TC and demonstration to facilitate movement at target joints and correct muscle activation for all testing and exercises performed.      PATIENT EDUCATION: Education details: Standing posture Person educated: Patient Education method: Explanation, Demonstration, Tactile cues, and Verbal cues Education comprehension: verbalized understanding, returned demonstration, verbal cues required, tactile cues required, and needs further education   HOME EXERCISE PROGRAM: No updates this session   PT Short Term Goals -       PT SHORT TERM GOAL #1    Title The patient will perform initial HEP with minimum assistance in order to improve strength and function.    Baseline Patient demonstrating independence with initial HEP as of 03/10/2020 with no questions or difficulty.    Time 4    Period Weeks    Status Achieved    Target Date 02/11/20              PT Long Term Goals -       PT LONG TERM GOAL #1   Title The patient will be compliant with finalized HEP with minimum assistance in preparation for self management and maintenance of condition.    Baseline 03/10/2020- Patient familiar with initial HEP and will keep goal active as HEP becomes more progressive including possible transfers and standing activities. 04/07/2020- Patient continues to report compliance with LE strengthening home exercises without questions or concerns. 04/21/2020- Patient able to verbalize and demonstrate good understanding of current HEP including some supine and seated LE strengthening exercises. Reviewed and patient performed 10 reps today with minimal cueing. Will keep goal active to progress HEP as  appropriate. 05/28/2020- patient reports continues to perform LE strengthening as able and some help from caregiver as able. No changes at this time to home program as patient is limited to supine/seated therex. 07/02/2020- Patient continues to report compliance with current supine and seated LE strengtheing home program and states no questions or concerns regarding her plan.    Time 12    Period Weeks    Status Achieved      PT LONG TERM GOAL #2   Title The patient will demonstrate at least 10 point improvement on FOTO score indicating an improved ability to perform functional activities.    Baseline on eval (/720) score 12; 10/22/19: 12; 12/10/19: 18, 12/13: 18: 04/07/2020- Will assess next visit. 07/02/2020- Will obtain next visit. 09/24/2020= Will obtain next visit. 11/12/2020= 12; 12/22/2020= 12; 03/30/2021= 17; 06/24/2021= Will assess next visit    Time 12    Period Weeks     Status On-going    Target Date 09/16/21      PT LONG TERM GOAL #3   Title The patient will demonstrate lateral scooting  for assistance with transfers with minA to maximize independence.    Baseline 10/22/19: Pt requires modA+1 for lateral scooting; 12/10/19: modA+1, 12/13: modA-maxA, 12/27: modA-maxA, 03/10/2020- ModA-MaxA- increased verbal cues and visual demonstration. 04/07/2020- Continued Max assist to perform forward/lateral scoot. 04/21/2020- Patient was able to demo slight lateral scoot with CGA approx 12 in then fatigued requiring max assist- she is able to scoot forward by leaning without significant issues. 05/28/2020- While sitting in chair- patient able to perform forward and lateral scoot with VC/visual demo and increased time allowed- CGA today but not consistent yet- will keep goal active at this time to continue to focus on strengthening and improving technique. 07/02/2020- Patient able to demonstrate minimal ability to laterally shift weigh on mat table requiring max assist yet is able to forward scoot out to edge of mat table with min Assist. 08/20/2020 - Patient was able to demonstrate minimal lateral scooting at edge of mat while holding feet still so they did not slip on tile surface. She continues to be able to demonstrate scoot forward and uses tilt option to recline to position safely back into seat of power wheelchair. 09/25/2020- Patient able to sit on mat and minimally scoot laterally but requires min/mod A for efficient scooting. Will end goal at this time due to plateau in progress with scooting over extended time.    Time 12    Period Weeks    Status Not Met      PT LONG TERM GOAL #4   Title The patient will demonstrate a squat pivot transfer with minimum assistance to maximize independence and mobility.    Baseline 10/22/19: unable to safely attempt at this time; 12/10/19: unable to safely attempt at this time, 01/14/20: unable to safety attempt at this time. 03/10/2020- Patient  able to perform Stand pivot transfer with Maximal assist. 04/07/2020- Patient continues to require Max assist with sit to stand- will assist SPT next visit. 04/21/2020- Patient presents with Max assist to perform Sit to stand Transfer- unable to pivot or move Left LE well without difficulty. 07/02/2020= Max assist with SPT and minimal ability to pivot. 7/202/2022-Patient able to stand with minimal assist this visit (as good as CGA last visit) and able to move right foot to pivot and performing with moderate assist today. 09/24/2020- Patient able to perform sit to stand with Min assist from lift chair. Patient has demo inconsistency and will  keep goal active.  12/17/2020- Goal is currently not appropriate as patient fractured her ankle and currently NWB. 12/22/2020=Goal is currently not appropriate as patient fractured her ankle and currently NWB.  03/30/2021=Patient was able to demo min assist to stand holding onto PT back of arms yet continues to require max assist to pivot as she was unable to pivot or turn her legs consistently today. 06/24/2021=Able to stand with varying ability (anywhere from min/mod/max A) - last visit able to stand pivot transfer and move right LE as previously unable. 07/06/2021- Patient continues to stand with mod assist and able to move Right LE to pivot to right from w/c to mat table.    Time 12    Period Weeks    Status On-going    Target Date 09/16/21      PT LONG TERM GOAL #5   Title The patient will demonstrate sitting without UE support for 2-5 minutes at EOB to improve participation and maximize independence with ADLs.    Baseline 10/22/19: Pt able to sit unsupported for at least 2 minutes at edge of bed. 04/21/2020- Patient continues to demo good sitting at edge of mat > 5 miin without difficulty or back support.    Time 12    Period Weeks    Status Achieved      PT LONG TERM GOAL #6   Title Patient will demonstrate improved functional LE strength as seen by consistent ability to  stand > 2 min (3 out of 4 trials) with Mod/Max A for improved LE strength with transfers.    Baseline 05/28/2020- Patient able to inconsistent stand 1-2 min right now in //bars with Mod/Max A. 07/02/2020- Patient was able to progress last 2 visit to standing using bilateral platform attachment between 1-2 min which is a progression from the parallel bars. 08/20/2020- Patient performed static standing with min- mod A using bilateral platform attachment on walker for 6 min 30 sec today with assist for trunk control and intermittent verbal cues for posture. 09/24/2020- Patient has consistently been able to stand >2 min in // bars and when using bilateral Platform walker. 12/23/2020= Will reopen this goal as patient should transition back to weightbearing and again practice standing in new cert. 03/30/2021=1) 1 min 23 sec   2)  23mn 40 sec  3) 1 min 50 sec   Left LE hyperextended with increased difficulty tucking/extending hips today. Requiring more mod assist to remain standing. 06/24/2021=Patient was able to stand 5 trials and able to stand > 2 min for 1 of the 5 trials. 1) 1 min 40 sec 2) 2 min 38 sec 3) 38 sec (difficulty adjusting in standing and reported left LE discomfort 4) 1 min 28 sec 5) 1 min 35 sec  -rest break between each trial. However- Patient did demonstrate less overall UE support and more erect posture with ability to maintain extended hips over feet as previously much more difficult to maintain.    Time 12    Period Weeks    Status On-going    Target Date 09/15/21      PT LONG TERM GOAL #7   Title Patient/caregiver will demonstrate assisted transfer using [employer] Lift consistently Independently for improved ability to transfer at home from lift chair to bed.    Baseline 09/24/2020- Patient requires hoyer lift (Dependent) for safe transfers.  12/17/2020- Patient currently still dependent on hoyer as she is currently non-weight bearing due to recent ankle fracture. 03/30/2021- Patient stated she has not  tried.  Agreeable to attempt in near future. 06/24/2021= Patient continues to rely on hoyer transfers at home.    Time 12    Period Weeks    Status Deferred    Target Date 06/22/21      PT LONG TERM GOAL #8   Title Patient will demo improved right LE quad strength as seen by ability to extend knee to 20 deg from 0 or less while performing a Long arc quad (for improved terminal knee ext and more full ROM)  for improved ability to bearing weight with Right LE.    Baseline 12/22/2020=able to extend knee to approx 35 deg from zero during long arc quad; 2/27/203- WIll reassess next visit; 5/232023=23 deg from zero    Time 12    Period Weeks    Status On-going    Target Date 09/16/21      PT LONG TERM GOAL  #9   TITLE Patient will ambulate > 100 feet using LiteGait exhibiting reciprocal steps to prepare for more weighted ambulation in future.    Baseline 06/24/2021- Patient recently on 06/22/2021 ambulated approx 75 feet with litegait - but visits before only approx 35 feet. 07/06/2021- Patient able to ambulate approx 50 feet today using litegait and 2 person assist.    Time 12    Period Weeks    Status On-going    Target Date 09/16/21              Plan     Clinical Impression Statement Patient arrived with good motivation for today's visit. Increased technical difficulty with harness and good fit taking up much of time requiring patient to perform more bed mobility including sit to supine and rolling. She was much more limited with gait activities involving Litegait today including difficulty advancing right LE. Patient appeared frustrated at end of visit but as usual performed with good effort just not her best. Author spent time reassuring her to continue to strive to perform her best each visit and encouraged her to continue with her HEP.  Patient will benefit from skilled Physical Therapy services to assist in improving functional mobility and strength to improve patient's quality of life.     Personal Factors and Comorbidities Age;Time since onset of injury/illness/exacerbation;Comorbidity 3+;Fitness    Comorbidities HTN, quadriparesis, history of DVT, neurogenic bladder    Examination-Activity Limitations Bathing;Hygiene/Grooming;Squat;Bed Mobility;Lift;Bend;Stand;Engineer, manufacturing;Toileting;Self Feeding;Transfers;Continence;Sit;Dressing;Sleep;Carry    Examination-Participation Restrictions Church;Laundry;Cleaning;Medication Management;Community Activity;Meal Prep;Interpersonal Relationship    Stability/Clinical Decision Making Evolving/Moderate complexity    Rehab Potential Fair    PT Frequency 2x / week    PT Duration 12 weeks    PT Treatment/Interventions ADLs/Self Care Home Management;Electrical Stimulation;Therapeutic activities;Wheelchair mobility training;Vasopneumatic Device;Joint Manipulations;Vestibular;Passive range of motion;Patient/family education;Therapeutic exercise;DME Instruction;Biofeedback;Aquatic Therapy;Moist Heat;Gait training;Balance training;Orthotic Fit/Training;Dry needling;Energy conservation;Taping;Splinting;Neuromuscular re-education;Cryotherapy;Ultrasound;Functional mobility training    PT Next Visit Plan Continue with progressive standing/transfer training; Progress LE Strengthening. Gait training with lite gait. Obtain latest FOTO next visit    PT Home Exercise Plan no changes    Consulted and Agree with Plan of Care Patient               Lewis Moccasin, PT 08/12/2021, 6:17 PM

## 2021-08-15 ENCOUNTER — Inpatient Hospital Stay
Admission: EM | Admit: 2021-08-15 | Discharge: 2021-08-20 | DRG: 480 | Disposition: A | Discharge status: Skilled Nursing Facility | Payer: Medicare HMO | Source: Skilled Nursing Facility | Attending: Obstetrics and Gynecology | Admitting: Obstetrics and Gynecology

## 2021-08-15 ENCOUNTER — Other Ambulatory Visit: Payer: Self-pay

## 2021-08-15 ENCOUNTER — Emergency Department: Payer: Medicare HMO

## 2021-08-15 ENCOUNTER — Inpatient Hospital Stay: Payer: Medicare HMO

## 2021-08-15 DIAGNOSIS — S7222XA Displaced subtrochanteric fracture of left femur, initial encounter for closed fracture: Principal | ICD-10-CM | POA: Diagnosis present

## 2021-08-15 DIAGNOSIS — I10 Essential (primary) hypertension: Secondary | ICD-10-CM | POA: Diagnosis present

## 2021-08-15 DIAGNOSIS — F418 Other specified anxiety disorders: Secondary | ICD-10-CM | POA: Diagnosis present

## 2021-08-15 DIAGNOSIS — W1839XA Other fall on same level, initial encounter: Secondary | ICD-10-CM | POA: Diagnosis present

## 2021-08-15 DIAGNOSIS — Z91014 Allergy to mammalian meats: Secondary | ICD-10-CM

## 2021-08-15 DIAGNOSIS — G8250 Quadriplegia, unspecified: Secondary | ICD-10-CM | POA: Diagnosis not present

## 2021-08-15 DIAGNOSIS — Z7901 Long term (current) use of anticoagulants: Secondary | ICD-10-CM | POA: Diagnosis not present

## 2021-08-15 DIAGNOSIS — W19XXXA Unspecified fall, initial encounter: Secondary | ICD-10-CM | POA: Diagnosis present

## 2021-08-15 DIAGNOSIS — I82409 Acute embolism and thrombosis of unspecified deep veins of unspecified lower extremity: Secondary | ICD-10-CM | POA: Diagnosis present

## 2021-08-15 DIAGNOSIS — Z7983 Long term (current) use of bisphosphonates: Secondary | ICD-10-CM | POA: Diagnosis not present

## 2021-08-15 DIAGNOSIS — D62 Acute posthemorrhagic anemia: Secondary | ICD-10-CM | POA: Diagnosis not present

## 2021-08-15 DIAGNOSIS — Z85828 Personal history of other malignant neoplasm of skin: Secondary | ICD-10-CM | POA: Diagnosis not present

## 2021-08-15 DIAGNOSIS — Z803 Family history of malignant neoplasm of breast: Secondary | ICD-10-CM

## 2021-08-15 DIAGNOSIS — Y93F1 Activity, caregiving, bathing: Secondary | ICD-10-CM

## 2021-08-15 DIAGNOSIS — K592 Neurogenic bowel, not elsewhere classified: Secondary | ICD-10-CM | POA: Diagnosis present

## 2021-08-15 DIAGNOSIS — S72009A Fracture of unspecified part of neck of unspecified femur, initial encounter for closed fracture: Secondary | ICD-10-CM

## 2021-08-15 DIAGNOSIS — Z86718 Personal history of other venous thrombosis and embolism: Secondary | ICD-10-CM

## 2021-08-15 DIAGNOSIS — S72141A Displaced intertrochanteric fracture of right femur, initial encounter for closed fracture: Secondary | ICD-10-CM | POA: Diagnosis present

## 2021-08-15 DIAGNOSIS — Y92121 Bathroom in nursing home as the place of occurrence of the external cause: Secondary | ICD-10-CM

## 2021-08-15 DIAGNOSIS — S14126A Central cord syndrome at C6 level of cervical spinal cord, initial encounter: Secondary | ICD-10-CM | POA: Diagnosis present

## 2021-08-15 DIAGNOSIS — S72142A Displaced intertrochanteric fracture of left femur, initial encounter for closed fracture: Principal | ICD-10-CM

## 2021-08-15 DIAGNOSIS — Z79899 Other long term (current) drug therapy: Secondary | ICD-10-CM | POA: Diagnosis not present

## 2021-08-15 DIAGNOSIS — N319 Neuromuscular dysfunction of bladder, unspecified: Secondary | ICD-10-CM | POA: Diagnosis present

## 2021-08-15 DIAGNOSIS — R532 Functional quadriplegia: Secondary | ICD-10-CM | POA: Diagnosis present

## 2021-08-15 HISTORY — DX: Fracture of unspecified part of neck of unspecified femur, initial encounter for closed fracture: S72.009A

## 2021-08-15 HISTORY — DX: Acute embolism and thrombosis of unspecified deep veins of unspecified lower extremity: I82.409

## 2021-08-15 LAB — URINALYSIS, ROUTINE W REFLEX MICROSCOPIC
Bilirubin Urine: NEGATIVE
Glucose, UA: NEGATIVE mg/dL
Hgb urine dipstick: NEGATIVE
Ketones, ur: 80 mg/dL — AB
Nitrite: POSITIVE — AB
Protein, ur: 30 mg/dL — AB
Specific Gravity, Urine: 1.02 (ref 1.005–1.030)
Squamous Epithelial / HPF: NONE SEEN (ref 0–5)
WBC, UA: >50 WBC/hpf — ABNORMAL HIGH (ref 0–5)
pH: 6 (ref 5.0–8.0)

## 2021-08-15 LAB — BASIC METABOLIC PANEL
Anion gap: 6 (ref 5–15)
BUN: 9 mg/dL (ref 8–23)
CO2: 28 mmol/L (ref 22–32)
Calcium: 8.9 mg/dL (ref 8.9–10.3)
Chloride: 102 mmol/L (ref 98–111)
Creatinine, Ser: 0.59 mg/dL (ref 0.44–1.00)
GFR, Estimated: >60 mL/min (ref >60)
Glucose, Bld: 108 mg/dL — ABNORMAL HIGH (ref 70–99)
Potassium: 4.3 mmol/L (ref 3.5–5.1)
Sodium: 136 mmol/L (ref 135–145)

## 2021-08-15 LAB — PROTIME-INR
INR: 1.2 (ref 0.8–1.2)
Prothrombin Time: 15.1 seconds (ref 11.4–15.2)

## 2021-08-15 LAB — CBC WITH DIFFERENTIAL/PLATELET
Abs Immature Granulocytes: 0.05 10*3/uL (ref 0.00–0.07)
Basophils Absolute: 0 10*3/uL (ref 0.0–0.1)
Basophils Relative: 0 %
Eosinophils Absolute: 0.2 10*3/uL (ref 0.0–0.5)
Eosinophils Relative: 3 %
HCT: 41.3 % (ref 36.0–46.0)
Hemoglobin: 12.9 g/dL (ref 12.0–15.0)
Immature Granulocytes: 1 %
Lymphocytes Relative: 19 %
Lymphs Abs: 1.4 10*3/uL (ref 0.7–4.0)
MCH: 28 pg (ref 26.0–34.0)
MCHC: 31.2 g/dL (ref 30.0–36.0)
MCV: 89.8 fL (ref 80.0–100.0)
Monocytes Absolute: 0.4 10*3/uL (ref 0.1–1.0)
Monocytes Relative: 5 %
Neutro Abs: 5.2 10*3/uL (ref 1.7–7.7)
Neutrophils Relative %: 72 %
Platelets: 335 10*3/uL (ref 150–400)
RBC: 4.6 MIL/uL (ref 3.87–5.11)
RDW: 14.5 % (ref 11.5–15.5)
WBC: 7.4 10*3/uL (ref 4.0–10.5)
nRBC: 0 % (ref 0.0–0.2)

## 2021-08-15 LAB — SAMPLE TO BLOOD BANK

## 2021-08-15 LAB — APTT: aPTT: 32 seconds (ref 24–36)

## 2021-08-15 MED ORDER — LIDOCAINE 5 % EX PTCH
1.0000 | MEDICATED_PATCH | CUTANEOUS | Status: DC
Start: 2021-08-15 — End: 2021-08-20
  Administered 2021-08-15 – 2021-08-20 (×6): 1 via TRANSDERMAL
  Filled 2021-08-15 (×6): qty 1

## 2021-08-15 MED ORDER — POLYETHYLENE GLYCOL 3350 17 G PO PACK
17.0000 g | PACK | Freq: Two times a day (BID) | ORAL | Status: DC
  Administered 2021-08-18 – 2021-08-19 (×2): 17 g via ORAL
  Filled 2021-08-15 (×9): qty 1

## 2021-08-15 MED ORDER — PSYLLIUM 95 % PO PACK
1.0000 | PACK | Freq: Every day | ORAL | Status: DC
  Administered 2021-08-16 – 2021-08-18 (×3): 1 via ORAL
  Filled 2021-08-15 (×6): qty 1

## 2021-08-15 MED ORDER — DULOXETINE HCL 30 MG PO CPEP
60.0000 mg | ORAL_CAPSULE | Freq: Every day | ORAL | Status: DC
  Administered 2021-08-17 – 2021-08-20 (×4): 60 mg via ORAL
  Filled 2021-08-15 (×5): qty 2

## 2021-08-15 MED ORDER — POLYVINYL ALCOHOL 1.4 % OP SOLN
2.0000 [drp] | Freq: Four times a day (QID) | OPHTHALMIC | Status: DC | PRN
Start: 2021-08-15 — End: 2021-08-20

## 2021-08-15 MED ORDER — LORATADINE 10 MG PO TABS
10.0000 mg | ORAL_TABLET | Freq: Every day | ORAL | Status: DC
  Administered 2021-08-15 – 2021-08-20 (×5): 10 mg via ORAL
  Filled 2021-08-15 (×5): qty 1

## 2021-08-15 MED ORDER — CEFAZOLIN SODIUM-DEXTROSE 2-4 GM/100ML-% IV SOLN
2.0000 g | INTRAVENOUS | Status: AC
  Administered 2021-08-16: 2 g via INTRAVENOUS

## 2021-08-15 MED ORDER — MOMETASONE FUROATE 0.1 % EX CREA
1.0000 | TOPICAL_CREAM | CUTANEOUS | Status: DC
Start: 2021-08-15 — End: 2021-08-20
  Filled 2021-08-15: qty 15

## 2021-08-15 MED ORDER — FENTANYL CITRATE PF 50 MCG/ML IJ SOSY
50.0000 ug | PREFILLED_SYRINGE | Freq: Once | INTRAMUSCULAR | Status: AC
  Administered 2021-08-15: 50 ug via INTRAVENOUS
  Filled 2021-08-15: qty 1

## 2021-08-15 MED ORDER — MORPHINE SULFATE (PF) 2 MG/ML IV SOLN
2.0000 mg | INTRAVENOUS | Status: DC | PRN
  Administered 2021-08-15 – 2021-08-20 (×9): 2 mg via INTRAVENOUS
  Filled 2021-08-15 (×9): qty 1

## 2021-08-15 MED ORDER — SACCHAROMYCES BOULARDII 250 MG PO CAPS
250.0000 mg | ORAL_CAPSULE | Freq: Every day | ORAL | Status: DC
  Administered 2021-08-17 – 2021-08-20 (×4): 250 mg via ORAL
  Filled 2021-08-15 (×6): qty 1

## 2021-08-15 MED ORDER — HYDROCODONE-ACETAMINOPHEN 5-325 MG PO TABS: 1.0000 | ORAL_TABLET | ORAL | Status: DC | PRN

## 2021-08-15 MED ORDER — LACTULOSE 10 GM/15ML PO SOLN
20.0000 g | Freq: Every day | ORAL | Status: DC | PRN
  Filled 2021-08-15: qty 30

## 2021-08-15 MED ORDER — CHLORHEXIDINE GLUCONATE 4 % EX LIQD: Freq: Once | CUTANEOUS | Status: DC

## 2021-08-15 MED ORDER — GABAPENTIN 600 MG PO TABS
600.0000 mg | ORAL_TABLET | Freq: Every day | ORAL | Status: DC
  Administered 2021-08-15 – 2021-08-19 (×5): 600 mg via ORAL
  Filled 2021-08-15 (×5): qty 1

## 2021-08-15 MED ORDER — OXYCODONE-ACETAMINOPHEN 5-325 MG PO TABS
1.0000 | ORAL_TABLET | ORAL | Status: DC | PRN
  Administered 2021-08-15 – 2021-08-20 (×8): 1 via ORAL
  Filled 2021-08-15 (×8): qty 1

## 2021-08-15 MED ORDER — VITAMIN D 25 MCG (1000 UNIT) PO TABS
1000.0000 [IU] | ORAL_TABLET | Freq: Every day | ORAL | Status: DC
  Administered 2021-08-17 – 2021-08-20 (×4): 1000 [IU] via ORAL
  Filled 2021-08-15 (×4): qty 1

## 2021-08-15 MED ORDER — ADULT MULTIVITAMIN W/MINERALS CH
1.0000 | ORAL_TABLET | Freq: Every day | ORAL | Status: DC
  Administered 2021-08-17 – 2021-08-20 (×4): 1 via ORAL
  Filled 2021-08-15 (×4): qty 1

## 2021-08-15 MED ORDER — ONDANSETRON HCL 4 MG/2ML IJ SOLN: 4.0000 mg | Freq: Three times a day (TID) | INTRAMUSCULAR | Status: DC | PRN

## 2021-08-15 MED ORDER — TIZANIDINE HCL 2 MG PO TABS
2.0000 mg | ORAL_TABLET | Freq: Three times a day (TID) | ORAL | Status: DC
  Administered 2021-08-15 – 2021-08-20 (×15): 2 mg via ORAL
  Filled 2021-08-15 (×17): qty 1

## 2021-08-15 MED ORDER — SENNOSIDES-DOCUSATE SODIUM 8.6-50 MG PO TABS: 1.0000 | ORAL_TABLET | Freq: Every evening | ORAL | Status: DC | PRN

## 2021-08-15 MED ORDER — GABAPENTIN 300 MG PO CAPS
300.0000 mg | ORAL_CAPSULE | Freq: Every morning | ORAL | Status: DC
  Administered 2021-08-16 – 2021-08-20 (×5): 300 mg via ORAL
  Filled 2021-08-15 (×5): qty 1

## 2021-08-15 MED ORDER — BEANO PO TABS: 1.0000 | ORAL_TABLET | Freq: Three times a day (TID) | ORAL | Status: DC

## 2021-08-15 MED ORDER — FENTANYL CITRATE PF 50 MCG/ML IJ SOSY
50.0000 ug | PREFILLED_SYRINGE | INTRAMUSCULAR | Status: DC | PRN
  Administered 2021-08-15 (×2): 50 ug via INTRAVENOUS
  Filled 2021-08-15 (×3): qty 1

## 2021-08-15 MED ORDER — SPIRONOLACTONE 25 MG PO TABS
50.0000 mg | ORAL_TABLET | Freq: Every day | ORAL | Status: DC
  Administered 2021-08-15 – 2021-08-19 (×4): 50 mg via ORAL
  Filled 2021-08-15 (×4): qty 2

## 2021-08-15 MED ORDER — ACETAMINOPHEN 325 MG PO TABS: 650.0000 mg | ORAL_TABLET | Freq: Four times a day (QID) | ORAL | Status: DC | PRN

## 2021-08-15 MED ORDER — METHOCARBAMOL 500 MG PO TABS
500.0000 mg | ORAL_TABLET | Freq: Three times a day (TID) | ORAL | Status: DC | PRN
  Administered 2021-08-17 – 2021-08-19 (×3): 500 mg via ORAL
  Filled 2021-08-15 (×3): qty 1

## 2021-08-15 MED ORDER — ALUM & MAG HYDROXIDE-SIMETH 200-200-20 MG/5ML PO SUSP: 30.0000 mL | ORAL | Status: DC | PRN

## 2021-08-15 MED ORDER — EPINEPHRINE 0.3 MG/0.3ML IJ SOAJ
0.3000 mg | INTRAMUSCULAR | Status: DC | PRN
Start: 2021-08-15 — End: 2021-08-20

## 2021-08-15 MED ORDER — SODIUM CHLORIDE 0.9 % IV BOLUS
1000.0000 mL | Freq: Once | INTRAVENOUS | Status: AC
  Administered 2021-08-15: 1000 mL via INTRAVENOUS

## 2021-08-15 MED ORDER — SODIUM CHLORIDE 0.9 % IV SOLN: INTRAVENOUS | Status: DC

## 2021-08-15 MED ORDER — CAPSAICIN 0.025 % EX CREA
1.0000 | TOPICAL_CREAM | Freq: Every evening | CUTANEOUS | Status: DC
  Filled 2021-08-15 (×2): qty 60

## 2021-08-15 MED ORDER — MORPHINE SULFATE (PF) 2 MG/ML IV SOLN: 1.0000 mg | INTRAVENOUS | Status: DC | PRN

## 2021-08-15 MED ORDER — DIPHENHYDRAMINE HCL 25 MG PO CAPS: 25.0000 mg | ORAL_CAPSULE | Freq: Four times a day (QID) | ORAL | Status: DC | PRN

## 2021-08-15 MED ORDER — HYDRALAZINE HCL 20 MG/ML IJ SOLN: 5.0000 mg | INTRAMUSCULAR | Status: DC | PRN

## 2021-08-15 NOTE — Assessment & Plan Note (Signed)
-   Fall precaution -Continue muscle relaxer, tizanidine -Continue Neurontin

## 2021-08-15 NOTE — ED Notes (Signed)
This RN attempted to roll pt to clean her bottom. Pt asked to stop due to significant amount of pain. She said wait til they knock her out.

## 2021-08-15 NOTE — H&P (Addendum)
History and Physical    Sherry [name] QIO:[id number] DOB: 09/23/1934 DOA: 08/15/2021  Referring MD/NP/PA:   PCP: Venia Carbon, MD   Patient coming from:  The patient is coming from SNF  Chief Complaint: fall and hip pain  HPI: Sherry [name] is a 87 y.o. 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  Patient has history of quadriplegia, and has been doing PT/OT with improvement of function. Pt has some use of both arms and can feed herself. She can walk a little bit with assistance. Pt accidentally fell at about 930 this morning after taking shower.  She injured her hips.  She developed pain in the left hip, which is constant, sharp, severe, nonradiating.  Patient does not have much pain in the right hip. Denies loss of consciousness.  Strongly denies any head or neck injury.  She refused CT of head and neck.  Patient does not have nausea, vomiting, diarrhea or abdominal pain.  No symptoms of UTI.  No history of CAD or CHF.    Data reviewed independently and ED Course: pt was found to have WBC 7.4, INR 1.2, GFR> 60, temperature normal, blood pressure 128/66, heart rate 88, RR 17, oxygen saturation 100% on room air.  Chest x-ray negative.  X-ray showed bilateral hip fracture.  Patient is admitted to [facility] bed as inpatient.  Dr. Sabra Heck of Ortho is consulted.  -X-ray of hip/pelvis: 1. Bilateral hip fractures. 2. Comminuted intertrochanteric fracture of the right hip. 3. Angulated fracture of the proximal left femur just below the intertrochanteric region.   EKG: I have personally reviewed.  Sinus rhythm, QTc 453, low voltage, Q-wave in lead III, nonspecific T wave change.   Review of Systems:   General: no fevers, chills, no body weight gain, has  fatigue HEENT: no blurry vision, hearing changes or sore throat Respiratory: no dyspnea, coughing, wheezing CV: no chest pain, no palpitations GI: no nausea, vomiting, abdominal pain, diarrhea, constipation GU: no dysuria, burning on urination, increased urinary frequency, hematuria  Ext: no leg edema Neuro: has quadriplegia, no vision change or hearing loss. Has fall. Skin: no rash, no skin tear. MSK: has left hip pain Heme: No easy bruising.  Travel history: No recent long distant travel.   Allergy:  Allergies  Allergen Reactions   Other Swelling   Lisinopril-Hydrochlorothiazide Swelling    Swelling of the tongue   Beef-Derived Products Swelling    Facial and lip swelling (due to tick bite)   Dairy Aid [Tilactase] Swelling   Lisinopril-Hydrochlorothiazide Swelling    Tongue swelling   Pork-Derived Products Swelling    Past Medical History:  Diagnosis Date   Acute blood loss anemia    Arthritis    Cancer (HCC)    skin   Central cord syndrome at C6 level of cervical spinal cord ([place]) 11/29/2017   Hypertension    Protein-calorie malnutrition, severe ([place]) 01/24/2018   S/P insertion of IVC (inferior vena caval) filter 01/24/2018   Tetraparesis ([place])     Past Surgical History:  Procedure Laterality Date   ANTERIOR CERVICAL DECOMP/DISCECTOMY FUSION N/A 11/29/2017   Procedure: Cervical five-six, six-seven Anterior Cervical Decompression Fusion;  Surgeon: Judith Part, MD;  Location: [facility];  Service: Neurosurgery;  Laterality: N/A;  Cervical five-six, six-seven Anterior Cervical Decompression Fusion  CATARACT EXTRACTION     IR IVC FILTER PLMT / S&I /IMG GUID/MOD SED  12/08/2017    Social History:  reports that she has never smoked. She has never used smokeless tobacco. She reports that she does not drink alcohol and does not use drugs.  Family History:  Family History  Problem Relation Age of Onset   Breast cancer Maternal Aunt 75     Prior to Admission  medications   Medication Sig Start Date End Date Taking? Authorizing Provider  acetic acid 0.25 % irrigation Irrigate with 1 Application as directed daily at 6 (six) AM.   Yes [provider]  alendronate (FOSAMAX) 70 MG tablet Take 70 mg by mouth every Saturday.   Yes [provider]  Alpha-D-Galactosidase (BEANO) TABS Take 1 tablet by mouth 3 (three) times daily.   Yes [provider]  Capsaicin 0.1 % CREA Apply 1 application  topically every evening. (Apply to hands)   Yes [provider]  cholecalciferol (VITAMIN D3) 25 MCG (1000 UNIT) tablet Take 1,000 Units by mouth daily.   Yes [provider]  acetaminophen (TYLENOL) 325 MG tablet Take 650 mg by mouth every 6 (six) hours.  01/20/18   [provider]  alum & mag hydroxide-simeth (MAALOX PLUS) 400-400-40 MG/5ML suspension Take 30 mLs by mouth every 4 (four) hours as needed for indigestion.    [provider]  amLODipine (NORVASC) 10 MG tablet Take 10 mg by mouth daily after supper.     [provider]  bisacodyl (DULCOLAX) 10 MG suppository Place 10 mg rectally daily as needed for moderate constipation. 01/24/18   [provider]  calcium carbonate (TUMS EX) 750 MG chewable tablet Chew 1 tablet by mouth 3 (three) times daily. 02/10/18   [provider]  DULoxetine (CYMBALTA) 60 MG capsule Take 60 mg by mouth daily.  02/01/18   [provider]  gabapentin (NEURONTIN) 400 MG capsule Take 300 mg by mouth every 6 (six) hours. Also takes '600MG'$  by mouth at bedtime for neurogenic bladder 01/20/18   [provider]  lactulose (CHRONULAC) 10 GM/15ML solution Take 30 mLs (20 g total) by mouth daily as needed for mild constipation. 01/21/20   Paulette Blanch, MD  loratadine (CLARITIN) 10 MG tablet Take 10 mg by mouth daily.  01/19/18   [provider]  magnesium hydroxide (MILK OF MAGNESIA) 400 MG/5ML suspension Take 30 mLs by mouth every 4  (four) hours as needed for mild constipation.    [provider]  metoCLOPramide (REGLAN) 5 MG tablet Take 1 tablet (5 mg total) by mouth every 8 (eight) hours as needed for nausea. Patient not taking: Reported on 11/06/2018 09/27/18 09/27/19  Merlyn Lot, MD  Multiple Vitamin (MULTIVITAMIN WITH MINERALS) TABS tablet Take 1 tablet by mouth daily.    [provider]  nortriptyline (PAMELOR) 25 MG capsule Take 25 mg by mouth at bedtime. 01/20/18   [provider]  Ostomy Supplies (SKIN PREP WIPES) MISC Apply to both heals daily 01/20/18   [provider]  oxyCODONE (OXY IR/ROXICODONE) 5 MG immediate release tablet Take 0.5 tablets (2.5 mg total) by mouth every 12 hours as needed for severe pain. 03/29/18   Medina-Vargas, Monina C, NP  Probiotic Product (RISA-BID PROBIOTIC) TABS Take 1 tablet by mouth daily.  02/04/18   [provider]  rivaroxaban (XARELTO) 20 MG TABS tablet Take 1 tablet (20 mg total) by mouth daily with supper. Patient taking differently: Take 10 mg  by mouth daily with supper.  01/18/18   Angiulli, Lavon Paganini, PA-C  sennosides-docusate sodium (SENOKOT-S) 8.6-50 MG tablet Take 2 tablets by mouth 2 (two) times a day.    [provider]  Skin Protectants, Misc. (ENDIT EX) Apply topically to bottom / peri-area after preforming peri-care 01/22/18   [provider]  sodium phosphate (FLEET) 7-19 GM/118ML ENEM Place 1 enema rectally daily as needed for severe constipation.    [provider]  spironolactone (ALDACTONE) 25 MG tablet Take 25 mg by mouth 2 (two) times daily.  03/08/18   [provider]  tizanidine (ZANAFLEX) 2 MG capsule Take 2 mg by mouth every 8 (eight) hours.  03/03/18   [provider]    Physical Exam: Vitals:   08/15/21 1530 08/15/21 1600 08/15/21 1630 08/15/21 1700  BP: 131/76 130/67 116/65 117/62  Pulse: 95 96 97 91  Resp: '14 20 14 12  '$ Temp:   98 F (36.7 C)   TempSrc:   Oral    SpO2: 100% 96% 99% 100%  Weight:      Height:       General: Not in acute distress HEENT:       Eyes: PERRL, EOMI, no scleral icterus.       ENT: No discharge from the ears and nose, no pharynx injection, no tonsillar enlargement.        Neck: No JVD, no bruit, no mass felt. Heme: No neck lymph node enlargement. Cardiac: S1/S2, RRR, No murmurs, No gallops or rubs. Respiratory: No rales, wheezing, rhonchi or rubs. GI: Soft, nondistended, nontender, no rebound pain, no organomegaly, BS present. GU: No hematuria Ext: No pitting leg edema bilaterally. 1+DP/PT pulse bilaterally. Musculoskeletal: has tenderness in left hip Skin: No rashes.  Neuro: Alert, oriented X3, cranial nerves II-XII grossly intact, has quadriplegia (arms are more functioning than legs) Psych: Patient is not psychotic, no suicidal or hemocidal ideation.  Labs on Admission: I have personally reviewed following labs and imaging studies  CBC: Recent Labs  Lab 08/15/21 1035  WBC 7.4  NEUTROABS 5.2  HGB 12.9  HCT 41.3  MCV 89.8  PLT 740   Basic Metabolic Panel: Recent Labs  Lab 08/15/21 1035  NA 136  K 4.3  CL 102  CO2 28  GLUCOSE 108*  BUN 9  CREATININE 0.59  CALCIUM 8.9   GFR: Estimated Creatinine Clearance: 57.5 mL/min (by C-G formula based on SCr of 0.59 mg/dL). Liver Function Tests: No results for input(s): "AST", "ALT", "ALKPHOS", "BILITOT", "PROT", "ALBUMIN" in the last 168 hours. No results for input(s): "LIPASE", "AMYLASE" in the last 168 hours. No results for input(s): "AMMONIA" in the last 168 hours. Coagulation Profile: Recent Labs  Lab 08/15/21 1035  INR 1.2   Cardiac Enzymes: No results for input(s): "CKTOTAL", "CKMB", "CKMBINDEX", "TROPONINI" in the last 168 hours. BNP (last 3 results) No results for input(s): "PROBNP" in the last 8760 hours. HbA1C: No results for input(s): "HGBA1C" in the last 72 hours. CBG: No results for input(s): "GLUCAP" in the last 168 hours. Lipid  Profile: No results for input(s): "CHOL", "HDL", "LDLCALC", "TRIG", "CHOLHDL", "LDLDIRECT" in the last 72 hours. Thyroid Function Tests: No results for input(s): "TSH", "T4TOTAL", "FREET4", "T3FREE", "THYROIDAB" in the last 72 hours. Anemia Panel: No results for input(s): "VITAMINB12", "FOLATE", "FERRITIN", "TIBC", "IRON", "RETICCTPCT" in the last 72 hours. Urine analysis:    Component Value Date/Time   COLORURINE YELLOW (A) 08/15/2021 1627   APPEARANCEUR HAZY (A) 08/15/2021 1627   LABSPEC  1.020 08/15/2021 1627   PHURINE 6.0 08/15/2021 1627   GLUCOSEU NEGATIVE 08/15/2021 1627   HGBUR NEGATIVE 08/15/2021 1627   BILIRUBINUR NEGATIVE 08/15/2021 1627   KETONESUR 80 (A) 08/15/2021 1627   PROTEINUR 30 (A) 08/15/2021 1627   NITRITE POSITIVE (A) 08/15/2021 1627   LEUKOCYTESUR LARGE (A) 08/15/2021 1627   Sepsis Labs: '@LABRCNTIP'$ (procalcitonin:4,lacticidven:4) )No results found for this or any previous visit (from the past 240 hour(s)).   Radiological Exams on Admission: US Venous Img Lower Bilateral (DVT)  Result Date: 08/15/2021 CLINICAL DATA:  Pain, swelling, prior history of DVT EXAM: BILATERAL LOWER EXTREMITY VENOUS DOPPLER ULTRASOUND TECHNIQUE: Gray-scale sonography with graded compression, as well as color Doppler and duplex ultrasound were performed to evaluate the lower extremity deep venous systems from the level of the common femoral vein and including the common femoral, femoral, profunda femoral, popliteal and calf veins including the posterior tibial, peroneal and gastrocnemius veins when visible. The superficial great saphenous vein was also interrogated. Spectral Doppler was utilized to evaluate flow at rest and with distal augmentation maneuvers in the common femoral, femoral and popliteal veins. COMPARISON:  None Available. FINDINGS: RIGHT LOWER EXTREMITY Common Femoral Vein: No evidence of thrombus. Normal compressibility, respiratory phasicity and response to augmentation.  Saphenofemoral Junction: No evidence of thrombus. Normal compressibility and flow on color Doppler imaging. Profunda Femoral Vein: No evidence of thrombus. Normal compressibility and flow on color Doppler imaging. Femoral Vein: No evidence of thrombus. Normal compressibility, respiratory phasicity and response to augmentation. Popliteal Vein: No evidence of thrombus. Normal compressibility, respiratory phasicity and response to augmentation. Calf Veins: Not optimally visualized for evaluation. Superficial Great Saphenous Vein: No evidence of thrombus. Normal compressibility. Venous Reflux:  None. Other Findings:  None. LEFT LOWER EXTREMITY Common Femoral Vein: No evidence of thrombus. Normal compressibility, respiratory phasicity and response to augmentation. Saphenofemoral Junction: No evidence of thrombus. Normal compressibility and flow on color Doppler imaging. Profunda Femoral Vein: No evidence of thrombus. Normal compressibility and flow on color Doppler imaging. Femoral Vein: No evidence of thrombus. Normal compressibility, respiratory phasicity and response to augmentation. Popliteal Vein: Distal left popliteal vein is not optimally visualized. Calf Veins: Not adequately visualized for evaluation. Superficial Great Saphenous Vein: No evidence of thrombus. Normal compressibility. Venous Reflux:  None. Other Findings: Examination was technically difficult due to patient's clinical condition with bilateral hip fractures. IMPRESSION: There is no evidence of acute DVT in major deep veins in both lower extremities. Deep veins in both calves and the caudal course of left popliteal vein are not optimally visualized due to patient's clinical condition due to fractures in both proximal femurs. If there are continued symptoms, short-term follow-up study should be considered. Electronically Signed   By: Elmer Picker M.D.   On: 08/15/2021 18:32   DG Hip Unilat With Pelvis 2-3 Views Left  Result Date:  08/15/2021 CLINICAL DATA:  Fall.  Acute left hip pain. EXAM: DG HIP (WITH OR WITHOUT PELVIS) 2-3V LEFT; DG HIP (WITH OR WITHOUT PELVIS) 2-3V RIGHT COMPARISON:  CT of the abdomen and pelvis 01/21/2020 FINDINGS: Left proximal femur fracture noted just below the intertrochanteric region. Marked ventral angulation noted on the cross-table image. Slight varus angulation present Comminuted intratrochanteric fracture present on the right also with marked ventral angulation. IMPRESSION: 1. Bilateral hip fractures. 2. Comminuted intertrochanteric fracture of the right hip. 3. Angulated fracture of the proximal left femur just below the intertrochanteric region. Electronically Signed   By: San Morelle M.D.   On: 08/15/2021 11:54  DG HIP UNILAT WITH PELVIS 2-3 VIEWS RIGHT  Result Date: 08/15/2021 CLINICAL DATA:  Fall.  Acute left hip pain. EXAM: DG HIP (WITH OR WITHOUT PELVIS) 2-3V LEFT; DG HIP (WITH OR WITHOUT PELVIS) 2-3V RIGHT COMPARISON:  CT of the abdomen and pelvis 01/21/2020 FINDINGS: Left proximal femur fracture noted just below the intertrochanteric region. Marked ventral angulation noted on the cross-table image. Slight varus angulation present Comminuted intratrochanteric fracture present on the right also with marked ventral angulation. IMPRESSION: 1. Bilateral hip fractures. 2. Comminuted intertrochanteric fracture of the right hip. 3. Angulated fracture of the proximal left femur just below the intertrochanteric region. Electronically Signed   By: San Morelle M.D.   On: 08/15/2021 11:54   DG Chest 1 View  Result Date: 08/15/2021 CLINICAL DATA:  86 year old female history of trauma from a fall. EXAM: CHEST  1 VIEW COMPARISON:  None Available. FINDINGS: Lung volumes are normal. No consolidative airspace disease. No pleural effusions. No pneumothorax. No pulmonary nodule or mass noted. Pulmonary vasculature and the cardiomediastinal silhouette are within normal limits. Orthopedic fixation  hardware in the lower cervical spine incidentally noted. IMPRESSION: 1.  No radiographic evidence of acute cardiopulmonary disease. Electronically Signed   By: Vinnie Langton M.D.   On: 08/15/2021 11:48      Assessment/Plan Principal Problem:   Closed hip fracture [facility]) Active Problems:   Fall   DVT (deep venous thrombosis) (HCC)   Essential hypertension   Depression with anxiety   Quadriplegia (HCC)   Assessment and Plan: * Closed hip fracture ([place]) Patient has a bilateral hip fracture as evidenced by x-ray. Patient has moderate to sever pain now. No neurovascular compromise. Orthopedic surgeon, Dr. Sabra Heck was consulted.   - will admit to Med-surg bed - Pain control: morphine prn and percocet - When necessary Zofran for nausea - Robaxin for muscle spasm - type and cross - INR/PTT - PT/OT when able to (not ordered now)  Fall -pt/ot when able to -fall precaution.  DVT (deep venous thrombosis) ([place]) Patient is on prophylaxis dose Xarelto 10 mg daily which must to be held for surgery tomorrow.  Patient is allergic to pork-developed Pradaxa, cannot use IV heparin. Lower extremity venous Doppler was done which showed no evidence of acute DVT in major deep veins in both lower extremities. Pt is s/p of IVC filter. - will start SCD before surgery -After surgery, need to restart Xarelto as soon as possible    Essential hypertension -IV hydralazine as needed -Spironolactone   Depression with anxiety - Continue home medications: Cymbalta  Quadriplegia ([place]) - Fall precaution -Continue muscle relaxer, tizanidine -Continue Neurontin     Perioperative Cardiac Risk: pt has multiple comorbidities, including DVT and central cord syndrome with quadriplegia. Currently patient is dependent of ADLs and, IADLs, but pt is able to do some PT/OT.  Patient does not have a history of CAD and CHF. No recent acute cardiac issues.  Patient does not have chest pain, shortness of breath,  palpitation, leg edema.  No signs of acute CHF currently. EKG has no acute change. At this time point, no further work up is needed. Patient's GUPTA score perioperative myocardial infarction or cardaic arrest is 2.44 %.  I discussed the risk with patient and family, pt would like to proceed for surgery.     DVT ppx: SCD --> need to restart Xarelto as soon as possible after surgery.  Code Status: Full code  Family Communication: Yes, patient's daughter at bed side.  Disposition Plan:  Anticipate discharge back to previous environment, SNF  Consults called: Dr. Sabra Heck of ortho  Admission status and Level of care: Med-Surg:     as inpt           Severity of Illness:  The appropriate patient status for this patient is INPATIENT. Inpatient status is judged to be reasonable and necessary in order to provide the required intensity of service to ensure the patient's safety. The patient's presenting symptoms, physical exam findings, and initial radiographic and laboratory data in the context of their chronic comorbidities is felt to place them at high risk for further clinical deterioration. Furthermore, it is not anticipated that the patient will be medically stable for discharge from the hospital within 2 midnights of admission.   * I certify that at the point of admission it is my clinical judgment that the patient will require inpatient hospital care spanning beyond 2 midnights from the point of admission due to high intensity of service, high risk for further deterioration and high frequency of surveillance required.*       Date of Service 08/15/2021    Ivor Costa [facility]   If 7PM-7AM, please contact night-coverage [email or web address] 08/15/2021, 6:43 PM

## 2021-08-15 NOTE — Assessment & Plan Note (Signed)
-   Continue home medications: Cymbalta

## 2021-08-15 NOTE — Assessment & Plan Note (Signed)
-  IV hydralazine as needed -Spironolactone

## 2021-08-15 NOTE — ED Notes (Signed)
Pt pulse ox dropping below 90%. Pt placed on 2 liters for comfort at this time.

## 2021-08-15 NOTE — Anesthesia Preprocedure Evaluation (Signed)
Anesthesia Evaluation  Patient identified by MRN, date of birth, ID band Patient awake    Reviewed: Allergy & Precautions, NPO status , Patient's Chart, lab work & pertinent test results  Airway Mallampati: III  TM Distance: >3 FB Neck ROM: full    Dental  (+) Chipped   Pulmonary neg pulmonary ROS,    Pulmonary exam normal        Cardiovascular hypertension, negative cardio ROS Normal cardiovascular exam     Neuro/Psych negative neurological ROS  negative psych ROS   GI/Hepatic negative GI ROS, Neg liver ROS,   Endo/Other  negative endocrine ROS  Renal/GU      Musculoskeletal   Abdominal   Peds  Hematology negative hematology ROS (+)   Anesthesia Other Findings Past Medical History: No date: Acute blood loss anemia No date: Arthritis No date: Cancer ([place])     Comment:  skin 11/29/2017: Central cord syndrome at C6 level of cervical spinal cord  (HCC) No date: Hypertension 01/24/2018: Protein-calorie malnutrition, severe ([place]) 01/24/2018: S/P insertion of IVC (inferior vena caval) filter No date: Tetraparesis [facility])  Past Surgical History: 11/29/2017: ANTERIOR CERVICAL DECOMP/DISCECTOMY FUSION; N/A     Comment:  Procedure: Cervical five-six, six-seven Anterior               Cervical Decompression Fusion;  Surgeon: Judith Part, MD;  Location: [facility];  Service: Neurosurgery;                Laterality: N/A;  Cervical five-six, six-seven Anterior               Cervical Decompression Fusion No date: CATARACT EXTRACTION 12/08/2017: IR IVC FILTER PLMT / S&I /IMG GUID/MOD SED  BMI    Body Mass Index: 28.19 kg/m      Reproductive/Obstetrics negative OB ROS                             Anesthesia Physical Anesthesia Plan  ASA: 3  Anesthesia Plan: General ETT and General   Post-op Pain Management:    Induction: Intravenous  PONV Risk Score and Plan:  Ondansetron and Dexamethasone  Airway Management Planned:   Additional Equipment:   Intra-op Plan:   Post-operative Plan:   Informed Consent:     Dental Advisory Given  Plan Discussed with: Anesthesiologist, CRNA and Surgeon  Anesthesia Plan Comments: (Xarelto use on 7/14. A spinal is contraindicated for 72 hours. )        Anesthesia Quick Evaluation

## 2021-08-15 NOTE — Assessment & Plan Note (Addendum)
-  pt/ot when able to -fall precaution.

## 2021-08-15 NOTE — Assessment & Plan Note (Signed)
" >>  ASSESSMENT AND PLAN FOR QUADRIPLEGIA (HCC) WRITTEN ON 08/15/2021  6:37 PM BY NIU, XILIN, MD  - Fall precaution -Continue muscle relaxer, tizanidine  -Continue Neurontin  "

## 2021-08-15 NOTE — ED Provider Notes (Signed)
[facility] Provider Note   Event Date/Time   First MD Initiated Contact with Patient 08/15/21 1039     (approximate) History  Fall  HPI Sherry [name] is a 87 y.o. female with a stated past medical history of quadriplegia who presents after a mechanical fall from sitting down into a tub.  Patient has been complaining of left hip pain since then with a deformity to the anterior portion of this left hip.  Patient endorses worsening pain with any movement at the left hip and pressure relieved with rest. ROS: Patient currently denies any vision changes, tinnitus, difficulty speaking, facial droop, sore throat, chest pain, shortness of breath, abdominal pain, nausea/vomiting/diarrhea, dysuria, or weakness/numbness/paresthesias in any extremity   Physical Exam  Triage Vital Signs: ED Triage Vitals  Enc Vitals Group     BP 08/15/21 1034 (!) 150/83     Pulse Rate 08/15/21 1034 64     Resp 08/15/21 1034 15     Temp 08/15/21 1034 97.6 F (36.4 C)     Temp Source 08/15/21 1034 Oral     SpO2 08/15/21 1034 93 %     Weight 08/15/21 1032 180 lb (81.6 kg)     Height 08/15/21 1032 '5\' 7"'$  (1.702 m)     Head Circumference --      Peak Flow --      Pain Score 08/15/21 1030 8     Pain Loc --      Pain Edu? --      Excl. in [place]? --    Most recent vital signs: Vitals:   08/15/21 1400 08/15/21 1430  BP: 136/77 123/71  Pulse: 82 88  Resp: 15 17  Temp:    SpO2: 100% 100%   General: Awake, oriented x4. CV:  Good peripheral perfusion.  Resp:  Normal effort.  Abd:  No distention. Other:  Elderly overweight Caucasian female laying in bed in mild distress secondary to pain.  Deformity appreciated to the anterior portion of the left hip with tenderness to palpation over the anterior.  Extreme tenderness with any range of motion at the left hip.  Distally vascularly intact.  Decree sensation at baseline ED Results / Procedures / Treatments  Labs (all labs ordered are  listed, but only abnormal results are displayed) Labs Reviewed  BASIC METABOLIC PANEL - Abnormal; Notable for the following components:      Result Value   Glucose, Bld 108 (*)    All other components within normal limits  CBC WITH DIFFERENTIAL/PLATELET  PROTIME-INR  SAMPLE TO BLOOD BANK   RADIOLOGY ED MD interpretation: X-ray of the left hip interpreted by me shows angulated fracture of the proximal left femur just below the intertrochanteric region X-ray of the right hip interpreted by me and shows comminuted intertrochanteric fracture  One-view portable chest x-ray interpreted by me shows no evidence of acute abnormalities including no pneumonia, pneumothorax, or widened mediastinum -Agree with radiology assessment Official radiology report(s): DG Hip Unilat With Pelvis 2-3 Views Left  Result Date: 08/15/2021 CLINICAL DATA:  Fall.  Acute left hip pain. EXAM: DG HIP (WITH OR WITHOUT PELVIS) 2-3V LEFT; DG HIP (WITH OR WITHOUT PELVIS) 2-3V RIGHT COMPARISON:  CT of the abdomen and pelvis 01/21/2020 FINDINGS: Left proximal femur fracture noted just below the intertrochanteric region. Marked ventral angulation noted on the cross-table image. Slight varus angulation present Comminuted intratrochanteric fracture present on the right also with marked ventral angulation. IMPRESSION: 1. Bilateral hip fractures. 2. Comminuted intertrochanteric  fracture of the right hip. 3. Angulated fracture of the proximal left femur just below the intertrochanteric region. Electronically Signed   By: San Morelle M.D.   On: 08/15/2021 11:54   DG HIP UNILAT WITH PELVIS 2-3 VIEWS RIGHT  Result Date: 08/15/2021 CLINICAL DATA:  Fall.  Acute left hip pain. EXAM: DG HIP (WITH OR WITHOUT PELVIS) 2-3V LEFT; DG HIP (WITH OR WITHOUT PELVIS) 2-3V RIGHT COMPARISON:  CT of the abdomen and pelvis 01/21/2020 FINDINGS: Left proximal femur fracture noted just below the intertrochanteric region. Marked ventral angulation noted  on the cross-table image. Slight varus angulation present Comminuted intratrochanteric fracture present on the right also with marked ventral angulation. IMPRESSION: 1. Bilateral hip fractures. 2. Comminuted intertrochanteric fracture of the right hip. 3. Angulated fracture of the proximal left femur just below the intertrochanteric region. Electronically Signed   By: San Morelle M.D.   On: 08/15/2021 11:54   DG Chest 1 View  Result Date: 08/15/2021 CLINICAL DATA:  87 year old female history of trauma from a fall. EXAM: CHEST  1 VIEW COMPARISON:  None Available. FINDINGS: Lung volumes are normal. No consolidative airspace disease. No pleural effusions. No pneumothorax. No pulmonary nodule or mass noted. Pulmonary vasculature and the cardiomediastinal silhouette are within normal limits. Orthopedic fixation hardware in the lower cervical spine incidentally noted. IMPRESSION: 1.  No radiographic evidence of acute cardiopulmonary disease. Electronically Signed   By: Vinnie Langton M.D.   On: 08/15/2021 11:48   PROCEDURES: Critical Care performed: No .1-3 Lead EKG Interpretation  Performed by: Naaman Plummer, MD Authorized by: Naaman Plummer, MD     Interpretation: normal     ECG rate:  83   ECG rate assessment: normal     Rhythm: sinus rhythm     Ectopy: none     Conduction: normal    MEDICATIONS ORDERED IN ED: Medications  fentaNYL (SUBLIMAZE) injection 50 mcg (50 mcg Intravenous Given 08/15/21 1409)  fentaNYL (SUBLIMAZE) injection 50 mcg (50 mcg Intravenous Given 08/15/21 1057)  sodium chloride 0.9 % bolus 1,000 mL (0 mLs Intravenous Stopped 08/15/21 1410)   IMPRESSION / MDM / ASSESSMENT AND PLAN / ED COURSE  I reviewed the triage vital signs and the nursing notes.                             The patient is on the cardiac monitor to evaluate for evidence of arrhythmia and/or significant heart rate changes. Patient's presentation is most consistent with acute presentation with  potential threat to life or bodily function. Patient is a 87 year old female that presents for left hip pain Workup: XR hip Findings: Left femur fracture without dislocation Consult: Orthopedic Surgery, hospitalist  Patient does not currently demonstrate complications of fracture such as compartment syndrome, arterial or nerve injury.  Interventions: analgesia Disposition: Admit   FINAL CLINICAL IMPRESSION(S) / ED DIAGNOSES   Final diagnoses:  Closed 2-part intertrochanteric fracture of left femur, initial encounter ([place])   Rx / DC Orders   ED Discharge Orders     None      Note:  This document was prepared using Dragon voice recognition software and may include unintentional dictation errors.   Naaman Plummer, MD 08/15/21 1435

## 2021-08-15 NOTE — Assessment & Plan Note (Signed)
Patient has a bilateral hip fracture as evidenced by x-ray. Patient has moderate to sever pain now. No neurovascular compromise. Orthopedic surgeon, Dr. Sabra Heck was consulted.   - will admit to Med-surg bed - Pain control: morphine prn and percocet - When necessary Zofran for nausea - Robaxin for muscle spasm - type and cross - INR/PTT - PT/OT when able to (not ordered now)

## 2021-08-15 NOTE — ED Triage Notes (Signed)
BIB [facility] from [facility]. Pt was assisted to ground by staff.  Pt is quadraplegic. Pt was getting a shower and staff lost  thjeir grip but helped her to the ground. Pt has pain in her left hip.  Vitals: 157/88 92% RA 16 RR

## 2021-08-15 NOTE — Consult Note (Signed)
ORTHOPAEDIC CONSULTATION  REQUESTING PHYSICIAN: Naaman Plummer, MD  Chief Complaint: Left hip pain  HPI: Sherry [name] is a 87 y.o. female who complains of left hip pain after sliding out of her chair at [place] home today.  She was brought to the emergency room where exam and x-rays revealed a displaced trochanteric fracture of the left hip.  In addition x-rays of the pelvis show a comminuted intertrochanteric fracture of the right hip of indeterminate age, probably old.  Patient is partially quadriplegic after a fall in 2019 with a neck fracture.  She has some use of both arms and can feed herself.  She has some use of the lower legs and has been taking physical therapy at [facility] and walking some with assistance.  I have discussed the treatment options with the patient and her daughter at length.  This is a variant usual scenario I explained to them but the patient is having severe pain on the left side with none on the right and has been causing functional.  Therefore I think that intramedullary pinning of the left hip would be reasonable for pain control at the least.  Any function after that would be a bonus.  The risks of infection, nonunion, fracture, DVT and other medical problems were discussed.  Patient did have DVTs after her neck fracture and had vena cava filter was put in place.  She is on Xarelto as well.  She has not had any since yesterday.  At the patient's request we will schedule her for operative fixation of the fracture tomorrow.  Postop protocol was discussed as well.  Past Medical History:  Diagnosis Date   Acute blood loss anemia    Arthritis    Cancer ([place])    skin   Central cord syndrome at C6 level of cervical spinal cord ([place]) 11/29/2017   Hypertension    Protein-calorie malnutrition, severe ([place]) 01/24/2018   S/P insertion of IVC (inferior vena caval) filter 01/24/2018   Tetraparesis ([place])    Past Surgical History:  Procedure Laterality Date    ANTERIOR CERVICAL DECOMP/DISCECTOMY FUSION N/A 11/29/2017   Procedure: Cervical five-six, six-seven Anterior Cervical Decompression Fusion;  Surgeon: Judith Part, MD;  Location: [facility];  Service: Neurosurgery;  Laterality: N/A;  Cervical five-six, six-seven Anterior Cervical Decompression Fusion   CATARACT EXTRACTION     IR IVC FILTER PLMT / S&I /IMG GUID/MOD SED  12/08/2017   Social History   Socioeconomic History   Marital status: Married    Spouse name: Not on file   Number of children: Not on file   Years of education: Not on file   Highest education level: Not on file  Occupational History   Not on file  Tobacco Use   Smoking status: Never   Smokeless tobacco: Never  Vaping Use   Vaping Use: Never used  Substance and Sexual Activity   Alcohol use: Never   Drug use: Never   Sexual activity: Not on file  Other Topics Concern   Not on file  Social History Narrative   ** Merged History Encounter **       Social Determinants of Health   Financial Resource Strain: Not on file  Food Insecurity: Not on file  Transportation Needs: Not on file  Physical Activity: Not on file  Stress: Not on file  Social Connections: Not on file   Family History  Problem Relation Age of Onset   Breast cancer Maternal Aunt 8  Allergies  Allergen Reactions   Other Swelling   Lisinopril-Hydrochlorothiazide Swelling    Swelling of the tongue   Beef-Derived Products Swelling    Facial and lip swelling (due to tick bite)   Dairy Aid [Tilactase] Swelling   Lisinopril-Hydrochlorothiazide Swelling    Tongue swelling   Pork-Derived Products Swelling   Prior to Admission medications   Medication Sig Start Date End Date Taking? Authorizing Provider  acetaminophen (TYLENOL) 325 MG tablet Take 650 mg by mouth every 6 (six) hours.  01/20/18   [provider]  Alpha-D-Galactosidase (BEANO) TABS Take 150 mg by mouth 3 (three) times daily with meals as needed (Bloating). Give 2 tabs  = 300 mg    [provider]  alum & mag hydroxide-simeth (MAALOX PLUS) 400-400-40 MG/5ML suspension Take 30 mLs by mouth every 4 (four) hours as needed for indigestion.    [provider]  amLODipine (NORVASC) 10 MG tablet Take 10 mg by mouth daily after supper.     [provider]  bisacodyl (DULCOLAX) 10 MG suppository Place 10 mg rectally daily as needed for moderate constipation. 01/24/18   [provider]  calcium carbonate (TUMS EX) 750 MG chewable tablet Chew 1 tablet by mouth 3 (three) times daily. 02/10/18   [provider]  Capsaicin 0.1 % CREA Apply 1 application topically 2 (two) times daily. Apply to left shoulder, bilateral hands, and knees    [provider]  DULoxetine (CYMBALTA) 60 MG capsule Take 60 mg by mouth daily.  02/01/18   [provider]  gabapentin (NEURONTIN) 400 MG capsule Take 300 mg by mouth every 6 (six) hours. Also takes '600MG'$  by mouth at bedtime for neurogenic bladder 01/20/18   [provider]  lactulose (CHRONULAC) 10 GM/15ML solution Take 30 mLs (20 g total) by mouth daily as needed for mild constipation. 01/21/20   Paulette Blanch, MD  loratadine (CLARITIN) 10 MG tablet Take 10 mg by mouth daily.  01/19/18   [provider]  magnesium hydroxide (MILK OF MAGNESIA) 400 MG/5ML suspension Take 30 mLs by mouth every 4 (four) hours as needed for mild constipation.    [provider]  metoCLOPramide (REGLAN) 5 MG tablet Take 1 tablet (5 mg total) by mouth every 8 (eight) hours as needed for nausea. Patient not taking: Reported on 11/06/2018 09/27/18 09/27/19  Merlyn Lot, MD  Multiple Vitamin (MULTIVITAMIN WITH MINERALS) TABS tablet Take 1 tablet by mouth daily.    [provider]  NON FORMULARY Diet Type:  Regular - NO BEEF, PORK OR DAIRY PRODUCTS    [provider]  nortriptyline (PAMELOR) 25 MG capsule Take 25 mg by mouth at bedtime. 01/20/18   [provider]  Ostomy Supplies (SKIN PREP WIPES) MISC Apply to both heals daily 01/20/18   [provider]  oxyCODONE (OXY IR/ROXICODONE) 5 MG immediate release tablet Take 0.5 tablets (2.5 mg total) by mouth every 12 hours as needed for severe pain. 03/29/18   Medina-Vargas, Monina C, NP  Probiotic Product (RISA-BID PROBIOTIC) TABS Take 1 tablet by mouth daily.  02/04/18   [provider]  rivaroxaban (XARELTO) 20 MG TABS tablet Take 1 tablet (20 mg total) by mouth daily with supper. Patient taking differently: Take 10 mg by mouth daily with supper.  01/18/18   Angiulli, Lavon Paganini, PA-C  sennosides-docusate sodium (SENOKOT-S) 8.6-50 MG tablet Take 2 tablets by mouth 2 (two) times a day.    [provider]  Skin Protectants, Misc. (  ENDIT EX) Apply topically to bottom / peri-area after preforming peri-care 01/22/18   [provider]  sodium phosphate (FLEET) 7-19 GM/118ML ENEM Place 1 enema rectally daily as needed for severe constipation.    [provider]  spironolactone (ALDACTONE) 25 MG tablet Take 25 mg by mouth 2 (two) times daily.  03/08/18   [provider]  tizanidine (ZANAFLEX) 2 MG capsule Take 2 mg by mouth every 8 (eight) hours.  03/03/18   [provider]   DG Hip Unilat With Pelvis 2-3 Views Left  Result Date: 08/15/2021 CLINICAL DATA:  Fall.  Acute left hip pain. EXAM: DG HIP (WITH OR WITHOUT PELVIS) 2-3V LEFT; DG HIP (WITH OR WITHOUT PELVIS) 2-3V RIGHT COMPARISON:  CT of the abdomen and pelvis 01/21/2020 FINDINGS: Left proximal femur fracture noted just below the intertrochanteric region. Marked ventral angulation noted on the cross-table image. Slight varus angulation present Comminuted intratrochanteric fracture present on the right also with marked ventral angulation. IMPRESSION: 1. Bilateral hip fractures. 2. Comminuted intertrochanteric fracture of the right hip. 3. Angulated fracture of the proximal left femur just below  the intertrochanteric region. Electronically Signed   By: San Morelle M.D.   On: 08/15/2021 11:54   DG HIP UNILAT WITH PELVIS 2-3 VIEWS RIGHT  Result Date: 08/15/2021 CLINICAL DATA:  Fall.  Acute left hip pain. EXAM: DG HIP (WITH OR WITHOUT PELVIS) 2-3V LEFT; DG HIP (WITH OR WITHOUT PELVIS) 2-3V RIGHT COMPARISON:  CT of the abdomen and pelvis 01/21/2020 FINDINGS: Left proximal femur fracture noted just below the intertrochanteric region. Marked ventral angulation noted on the cross-table image. Slight varus angulation present Comminuted intratrochanteric fracture present on the right also with marked ventral angulation. IMPRESSION: 1. Bilateral hip fractures. 2. Comminuted intertrochanteric fracture of the right hip. 3. Angulated fracture of the proximal left femur just below the intertrochanteric region. Electronically Signed   By: San Morelle M.D.   On: 08/15/2021 11:54   DG Chest 1 View  Result Date: 08/15/2021 CLINICAL DATA:  87 year old female history of trauma from a fall. EXAM: CHEST  1 VIEW COMPARISON:  None Available. FINDINGS: Lung volumes are normal. No consolidative airspace disease. No pleural effusions. No pneumothorax. No pulmonary nodule or mass noted. Pulmonary vasculature and the cardiomediastinal silhouette are within normal limits. Orthopedic fixation hardware in the lower cervical spine incidentally noted. IMPRESSION: 1.  No radiographic evidence of acute cardiopulmonary disease. Electronically Signed   By: Vinnie Langton M.D.   On: 08/15/2021 11:48    Positive ROS: All other systems have been reviewed and were otherwise negative with the exception of those mentioned in the HPI and as above.  Physical Exam: General: Alert, no acute distress Cardiovascular: No pedal edema Respiratory: No cyanosis, no use of accessory musculature GI: No organomegaly, abdomen is soft and non-tender Skin: No lesions in the area of chief complaint Neurologic: Sensation intact  distally Psychiatric: Patient is competent for consent with normal mood and affect Lymphatic: No axillary or cervical lymphadenopathy  MUSCULOSKELETAL: Patient is alert and oriented and cooperative.  She is lying quietly on the stretcher.  Upper extremities show motion with good move movement fairly good movement of the elbows and shoulders with some contractures of the fingers and hands.  The right lower extremity shows status effective range of motion without significant pain.  She is externally rotated.  The left lower extremity shows pain with any attempted range of motion of the hip.  Skin is intact.  She has some dorsiflexion function  of the ankle.  There is some sensation as well.  Back is nontender.  Assessment: Acute subtrochanteric displaced fracture left hip Probable remote intertrochanteric fracture of the right hip Partial quadriplegia  Plan: After thorough discussion with the patient and her daughter we have elected to proceed with operative fixation of this fracture tomorrow.  Medical evaluation and clearance will be necessary before that by the hospitalist service.    Park Breed, MD [phone]   08/15/2021 3:48 PM

## 2021-08-15 NOTE — Assessment & Plan Note (Addendum)
Patient is on prophylaxis dose Xarelto 10 mg daily which must to be held for surgery tomorrow.  Patient is allergic to pork-developed Pradaxa, cannot use IV heparin. Lower extremity venous Doppler was done which showed no evidence of acute DVT in major deep veins in both lower extremities. Pt is s/p of IVC filter. - will start SCD before surgery -After surgery, need to restart Xarelto as soon as possible

## 2021-08-16 ENCOUNTER — Inpatient Hospital Stay: Payer: Medicare HMO | Admitting: Anesthesiology

## 2021-08-16 ENCOUNTER — Encounter
Admission: EM | Disposition: A | Discharge status: Skilled Nursing Facility | Payer: Self-pay | Source: Skilled Nursing Facility | Attending: Internal Medicine

## 2021-08-16 ENCOUNTER — Other Ambulatory Visit: Payer: Self-pay

## 2021-08-16 ENCOUNTER — Inpatient Hospital Stay: Payer: Medicare HMO

## 2021-08-16 DIAGNOSIS — S72009A Fracture of unspecified part of neck of unspecified femur, initial encounter for closed fracture: Secondary | ICD-10-CM | POA: Diagnosis not present

## 2021-08-16 HISTORY — PX: FEMUR IM NAIL: SHX1597

## 2021-08-16 LAB — CBC
HCT: 31.9 % — ABNORMAL LOW (ref 36.0–46.0)
Hemoglobin: 10.1 g/dL — ABNORMAL LOW (ref 12.0–15.0)
MCH: 28.8 pg (ref 26.0–34.0)
MCHC: 31.7 g/dL (ref 30.0–36.0)
MCV: 90.9 fL (ref 80.0–100.0)
Platelets: 206 10*3/uL (ref 150–400)
RBC: 3.51 MIL/uL — ABNORMAL LOW (ref 3.87–5.11)
RDW: 14.2 % (ref 11.5–15.5)
WBC: 16.9 10*3/uL — ABNORMAL HIGH (ref 4.0–10.5)
nRBC: 0 % (ref 0.0–0.2)

## 2021-08-16 LAB — PREPARE RBC (CROSSMATCH)

## 2021-08-16 LAB — ABO/RH: ABO/RH(D): AB POS

## 2021-08-16 SURGERY — INSERTION, INTRAMEDULLARY ROD, FEMUR
Anesthesia: General | Site: Hip | Laterality: Left

## 2021-08-16 MED ORDER — SUGAMMADEX SODIUM 200 MG/2ML IV SOLN
INTRAVENOUS | Status: DC | PRN
  Administered 2021-08-16: 200 mg via INTRAVENOUS

## 2021-08-16 MED ORDER — MAGNESIUM HYDROXIDE 400 MG/5ML PO SUSP
30.0000 mL | Freq: Every day | ORAL | Status: DC | PRN
  Administered 2021-08-18: 30 mL via ORAL
  Filled 2021-08-16: qty 30

## 2021-08-16 MED ORDER — TRANEXAMIC ACID-NACL 1000-0.7 MG/100ML-% IV SOLN
INTRAVENOUS | Status: AC
  Filled 2021-08-16: qty 100

## 2021-08-16 MED ORDER — VASOPRESSIN 20 UNIT/ML IV SOLN
INTRAVENOUS | Status: DC | PRN
  Administered 2021-08-16 (×2): 2 [IU] via INTRAVENOUS

## 2021-08-16 MED ORDER — LIDOCAINE HCL (CARDIAC) PF 100 MG/5ML IV SOSY
PREFILLED_SYRINGE | INTRAVENOUS | Status: DC | PRN
  Administered 2021-08-16: 60 mg via INTRAVENOUS

## 2021-08-16 MED ORDER — MENTHOL 3 MG MT LOZG: 1.0000 | LOZENGE | OROMUCOSAL | Status: DC | PRN

## 2021-08-16 MED ORDER — SODIUM CHLORIDE 0.9 % IV SOLN: INTRAVENOUS | Status: DC | PRN

## 2021-08-16 MED ORDER — LACTATED RINGERS IV SOLN: INTRAVENOUS | Status: DC | PRN

## 2021-08-16 MED ORDER — CEFAZOLIN SODIUM-DEXTROSE 2-4 GM/100ML-% IV SOLN
INTRAVENOUS | Status: AC
  Filled 2021-08-16: qty 100

## 2021-08-16 MED ORDER — LIDOCAINE HCL (PF) 2 % IJ SOLN
INTRAMUSCULAR | Status: AC
  Filled 2021-08-16: qty 5

## 2021-08-16 MED ORDER — BISACODYL 10 MG RE SUPP: 10.0000 mg | Freq: Every day | RECTAL | Status: DC | PRN

## 2021-08-16 MED ORDER — ALBUMIN HUMAN 5 % IV SOLN: INTRAVENOUS | Status: DC | PRN

## 2021-08-16 MED ORDER — HYDROCODONE-ACETAMINOPHEN 5-325 MG PO TABS
1.0000 | ORAL_TABLET | ORAL | Status: DC | PRN
  Administered 2021-08-17 – 2021-08-18 (×2): 2 via ORAL
  Administered 2021-08-18: 1 via ORAL
  Administered 2021-08-19: 2 via ORAL
  Administered 2021-08-19: 1 via ORAL
  Administered 2021-08-20 (×2): 2 via ORAL
  Filled 2021-08-16 (×4): qty 2
  Filled 2021-08-16 (×2): qty 1
  Filled 2021-08-16 (×2): qty 2

## 2021-08-16 MED ORDER — ACETAMINOPHEN 10 MG/ML IV SOLN: 1000.0000 mg | Freq: Once | INTRAVENOUS | Status: DC | PRN

## 2021-08-16 MED ORDER — OXYCODONE HCL 5 MG PO TABS: 5.0000 mg | ORAL_TABLET | Freq: Once | ORAL | Status: DC | PRN

## 2021-08-16 MED ORDER — MIDAZOLAM HCL 2 MG/2ML IJ SOLN
INTRAMUSCULAR | Status: AC
  Filled 2021-08-16: qty 2

## 2021-08-16 MED ORDER — EPHEDRINE SULFATE (PRESSORS) 50 MG/ML IJ SOLN
INTRAMUSCULAR | Status: DC | PRN
  Administered 2021-08-16 (×4): 5 mg via INTRAVENOUS

## 2021-08-16 MED ORDER — FERROUS SULFATE 325 (65 FE) MG PO TABS
325.0000 mg | ORAL_TABLET | Freq: Every day | ORAL | Status: DC
  Administered 2021-08-17 – 2021-08-20 (×4): 325 mg via ORAL
  Filled 2021-08-16 (×4): qty 1

## 2021-08-16 MED ORDER — GLYCOPYRROLATE 0.2 MG/ML IJ SOLN
INTRAMUSCULAR | Status: DC | PRN
  Administered 2021-08-16: .2 mg via INTRAVENOUS

## 2021-08-16 MED ORDER — BUPIVACAINE-EPINEPHRINE (PF) 0.5% -1:200000 IJ SOLN
INTRAMUSCULAR | Status: DC | PRN
  Administered 2021-08-16: 30 mL via PERINEURAL

## 2021-08-16 MED ORDER — FENTANYL CITRATE (PF) 100 MCG/2ML IJ SOLN: 25.0000 ug | INTRAMUSCULAR | Status: DC | PRN

## 2021-08-16 MED ORDER — CEFAZOLIN SODIUM-DEXTROSE 2-4 GM/100ML-% IV SOLN
2.0000 g | Freq: Three times a day (TID) | INTRAVENOUS | Status: AC
  Administered 2021-08-16 – 2021-08-17 (×3): 2 g via INTRAVENOUS
  Filled 2021-08-16 (×3): qty 100

## 2021-08-16 MED ORDER — ONDANSETRON HCL 4 MG/2ML IJ SOLN
INTRAMUSCULAR | Status: AC
  Filled 2021-08-16: qty 2

## 2021-08-16 MED ORDER — PHENYLEPHRINE 80 MCG/ML (10ML) SYRINGE FOR IV PUSH (FOR BLOOD PRESSURE SUPPORT)
PREFILLED_SYRINGE | INTRAVENOUS | Status: DC | PRN
  Administered 2021-08-16: 160 ug via INTRAVENOUS
  Administered 2021-08-16: 80 ug via INTRAVENOUS
  Administered 2021-08-16 (×4): 160 ug via INTRAVENOUS

## 2021-08-16 MED ORDER — TRANEXAMIC ACID-NACL 1000-0.7 MG/100ML-% IV SOLN: 1000.0000 mg | Freq: Once | INTRAVENOUS | Status: DC

## 2021-08-16 MED ORDER — PHENYLEPHRINE HCL-NACL 20-0.9 MG/250ML-% IV SOLN
INTRAVENOUS | Status: DC | PRN
  Administered 2021-08-16: 25 ug/min via INTRAVENOUS

## 2021-08-16 MED ORDER — ONDANSETRON HCL 4 MG/2ML IJ SOLN
INTRAMUSCULAR | Status: DC | PRN
  Administered 2021-08-16: 4 mg via INTRAVENOUS

## 2021-08-16 MED ORDER — ZOLPIDEM TARTRATE 5 MG PO TABS: 5.0000 mg | ORAL_TABLET | Freq: Every evening | ORAL | Status: DC | PRN

## 2021-08-16 MED ORDER — SODIUM CHLORIDE 0.45 % IV SOLN: INTRAVENOUS | Status: DC

## 2021-08-16 MED ORDER — DEXAMETHASONE SODIUM PHOSPHATE 10 MG/ML IJ SOLN
INTRAMUSCULAR | Status: DC | PRN
  Administered 2021-08-16: 10 mg via INTRAVENOUS

## 2021-08-16 MED ORDER — PHENYLEPHRINE HCL-NACL 20-0.9 MG/250ML-% IV SOLN
INTRAVENOUS | Status: AC
  Filled 2021-08-16: qty 250

## 2021-08-16 MED ORDER — SENNA 8.6 MG PO TABS
1.0000 | ORAL_TABLET | Freq: Two times a day (BID) | ORAL | Status: DC
Start: 2021-08-16 — End: 2021-08-20
  Administered 2021-08-16 – 2021-08-20 (×8): 8.6 mg via ORAL
  Filled 2021-08-16 (×8): qty 1

## 2021-08-16 MED ORDER — PROPOFOL 1000 MG/100ML IV EMUL
INTRAVENOUS | Status: AC
  Filled 2021-08-16: qty 100

## 2021-08-16 MED ORDER — ALBUMIN HUMAN 5 % IV SOLN
INTRAVENOUS | Status: AC
  Filled 2021-08-16: qty 500

## 2021-08-16 MED ORDER — FENTANYL CITRATE (PF) 100 MCG/2ML IJ SOLN
INTRAMUSCULAR | Status: AC
  Filled 2021-08-16: qty 2

## 2021-08-16 MED ORDER — METOCLOPRAMIDE HCL 5 MG/ML IJ SOLN: 5.0000 mg | Freq: Three times a day (TID) | INTRAMUSCULAR | Status: DC | PRN

## 2021-08-16 MED ORDER — PROPOFOL 10 MG/ML IV BOLUS
INTRAVENOUS | Status: DC | PRN
  Administered 2021-08-16: 120 mg via INTRAVENOUS

## 2021-08-16 MED ORDER — 0.9 % SODIUM CHLORIDE (POUR BTL) OPTIME
TOPICAL | Status: DC | PRN
  Administered 2021-08-16: 1000 mL

## 2021-08-16 MED ORDER — METOCLOPRAMIDE HCL 5 MG PO TABS: 5.0000 mg | ORAL_TABLET | Freq: Three times a day (TID) | ORAL | Status: DC | PRN

## 2021-08-16 MED ORDER — OXYCODONE HCL 5 MG/5ML PO SOLN: 5.0000 mg | Freq: Once | ORAL | Status: DC | PRN

## 2021-08-16 MED ORDER — PROPOFOL 10 MG/ML IV BOLUS
INTRAVENOUS | Status: AC
  Filled 2021-08-16: qty 20

## 2021-08-16 MED ORDER — MORPHINE SULFATE (PF) 2 MG/ML IV SOLN
0.5000 mg | INTRAVENOUS | Status: DC | PRN
  Administered 2021-08-16: 1 mg via INTRAVENOUS
  Filled 2021-08-16: qty 1

## 2021-08-16 MED ORDER — FLEET ENEMA 7-19 GM/118ML RE ENEM: 1.0000 | ENEMA | Freq: Once | RECTAL | Status: DC | PRN

## 2021-08-16 MED ORDER — TRANEXAMIC ACID-NACL 1000-0.7 MG/100ML-% IV SOLN
INTRAVENOUS | Status: DC | PRN
  Administered 2021-08-16 (×2): 1000 mg via INTRAVENOUS

## 2021-08-16 MED ORDER — CHLORHEXIDINE GLUCONATE CLOTH 2 % EX PADS
6.0000 | MEDICATED_PAD | Freq: Every day | CUTANEOUS | Status: DC
  Administered 2021-08-16 – 2021-08-20 (×5): 6 via TOPICAL

## 2021-08-16 MED ORDER — TRANEXAMIC ACID 1000 MG/10ML IV SOLN
INTRAVENOUS | Status: AC
  Filled 2021-08-16: qty 10

## 2021-08-16 MED ORDER — FENTANYL CITRATE (PF) 100 MCG/2ML IJ SOLN
INTRAMUSCULAR | Status: DC | PRN
Start: 2021-08-16 — End: 2021-08-16
  Administered 2021-08-16: 25 ug via INTRAVENOUS

## 2021-08-16 MED ORDER — PHENOL 1.4 % MT LIQD
1.0000 | OROMUCOSAL | Status: DC | PRN
Start: 2021-08-16 — End: 2021-08-20

## 2021-08-16 MED ORDER — ROCURONIUM BROMIDE 100 MG/10ML IV SOLN
INTRAVENOUS | Status: DC | PRN
  Administered 2021-08-16: 40 mg via INTRAVENOUS

## 2021-08-16 MED ORDER — DROPERIDOL 2.5 MG/ML IJ SOLN: 0.6250 mg | Freq: Once | INTRAMUSCULAR | Status: DC | PRN

## 2021-08-16 SURGICAL SUPPLY — 54 items
BIT DRILL CANN 16 HIP (BIT) ×1 IMPLANT
BIT DRILL CANN STP 6/9 HIP (BIT) ×1 IMPLANT
BIT DRILL SHORT 4.2 (BIT) IMPLANT
BIT DRILL TAPERED 10 (BIT) ×1 IMPLANT
BLADE HELICAL TFNA 110 (Anchor) ×1 IMPLANT
BLADE SURG 10 STRL SS (BLADE) ×1 IMPLANT
BLADE SURG SZ11 CARB STEEL (BLADE) ×2 IMPLANT
BNDG COHESIVE 4X5 TAN ST LF (GAUZE/BANDAGES/DRESSINGS) ×2 IMPLANT
BNDG COHESIVE 4X5 WHT NS (GAUZE/BANDAGES/DRESSINGS) ×2 IMPLANT
BNDG COHESIVE 6X5 TAN ST LF (GAUZE/BANDAGES/DRESSINGS) ×1 IMPLANT
CABLE SET DALL-MILES (Hips) IMPLANT
CHLORAPREP W/TINT 26 (MISCELLANEOUS) ×2 IMPLANT
DRAPE C-ARMOR (DRAPES) ×1 IMPLANT
DRAPE STERI IOBAN 125X83 (DRAPES) ×1 IMPLANT
DRAPE U-SHAPE 48X52 POLY STRL (PACKS) ×4 IMPLANT
DRILL BIT SHORT 4.2 (BIT) ×2
DRSG AQUACEL AG ADV 3.5X10 (GAUZE/BANDAGES/DRESSINGS) ×2 IMPLANT
ELECT EZSTD 165MM 6.5IN (MISCELLANEOUS) ×2
ELECT REM PT RETURN 9FT ADLT (ELECTROSURGICAL) ×2
ELECTRODE EZSTD 165MM 6.5IN (MISCELLANEOUS) IMPLANT
ELECTRODE REM PT RTRN 9FT ADLT (ELECTROSURGICAL) IMPLANT
GAUZE SPONGE 4X4 12PLY STRL (GAUZE/BANDAGES/DRESSINGS) ×2 IMPLANT
GAUZE XEROFORM 1X8 LF (GAUZE/BANDAGES/DRESSINGS) ×1 IMPLANT
GLOVE BIO SURGEON STRL SZ8 (GLOVE) ×2 IMPLANT
GLOVE SURG ORTHO 8.5 STRL (GLOVE) ×2 IMPLANT
GLOVE SURG XRAY 8.5 LX (GLOVE) ×2 IMPLANT
GOWN STRL REUS W/ TWL LRG LVL3 (GOWN DISPOSABLE) ×1 IMPLANT
GOWN STRL REUS W/TWL LRG LVL3 (GOWN DISPOSABLE) ×1
GOWN STRL REUS W/TWL LRG LVL4 (GOWN DISPOSABLE) ×2 IMPLANT
GUIDEWIRE 3.2X400 (WIRE) ×2 IMPLANT
HEMOVAC 400CC 10FR (MISCELLANEOUS) ×1 IMPLANT
KIT TURNOVER KIT A (KITS) ×2 IMPLANT
MANIFOLD NEPTUNE II (INSTRUMENTS) ×2 IMPLANT
MAT ABSORB  FLUID 56X50 GRAY (MISCELLANEOUS) ×1
MAT ABSORB FLUID 56X50 GRAY (MISCELLANEOUS) ×1 IMPLANT
NAIL TROCH FIX LNG 11X360LT (Nail) ×1 IMPLANT
NDL SPNL 18GX3.5 QUINCKE PK (NEEDLE) ×1 IMPLANT
NEEDLE SPNL 18GX3.5 QUINCKE PK (NEEDLE) ×2 IMPLANT
NS IRRIG 1000ML POUR BTL (IV SOLUTION) ×1 IMPLANT
NS IRRIG 500ML POUR BTL (IV SOLUTION) ×2 IMPLANT
PACK HIP COMPR (MISCELLANEOUS) ×2 IMPLANT
PAD ABD DERMACEA PRESS 5X9 (GAUZE/BANDAGES/DRESSINGS) ×2 IMPLANT
REAMER ROD DEEP FLUTE 2.5X950 (INSTRUMENTS) ×1 IMPLANT
SCREW LOCK STAR 5X42 (Screw) ×1 IMPLANT
SLEEVE CABLE 2MM VT (Orthopedic Implant) ×1 IMPLANT
SPONGE T-LAP 18X18 ~~LOC~~+RFID (SPONGE) ×5 IMPLANT
STAPLER SKIN PROX 35W (STAPLE) ×2 IMPLANT
STRAP SAFETY 5IN WIDE (MISCELLANEOUS) ×2 IMPLANT
SUT ETHILON 3 0 FSLX (SUTURE) ×2 IMPLANT
SUT TICRON 2-0 30IN 311381 (SUTURE) ×2 IMPLANT
SUT VIC AB 0 CT1 36 (SUTURE) ×2 IMPLANT
SUT VIC AB 2-0 CT1 (SUTURE) ×2 IMPLANT
SYR 30ML LL (SYRINGE) ×2 IMPLANT
WATER STERILE IRR 500ML POUR (IV SOLUTION) ×3 IMPLANT

## 2021-08-16 NOTE — TOC CM/SW Note (Signed)
CSW acknowledges consult for SNF/HH/DME needs. PT/OT evals pending. Will follow up once recommendations are made. Per chart review, patient lives at [facility] and gets outpatient PT and OT at [facility].  Dayton Scrape, [place]

## 2021-08-16 NOTE — Transfer of Care (Signed)
Immediate Anesthesia Transfer of Care Note  Patient: Sherry [name]  Procedure(s) Performed: INTRAMEDULLARY (IM) NAIL FEMORAL (Left: Hip)  Patient Location: PACU  Anesthesia Type:General  Level of Consciousness: awake, drowsy and patient cooperative  Airway & Oxygen Therapy: Patient Spontanous Breathing and Patient connected to face mask oxygen  Post-op Assessment: Report given to RN and Post -op Vital signs reviewed and stable  Post vital signs: Reviewed and stable  Last Vitals:  Vitals Value Taken Time  BP 103/65 08/16/21 1300  Temp 36.1 C 08/16/21 1250  Pulse 84 08/16/21 1301  Resp 23 08/16/21 1301  SpO2 95 % 08/16/21 1301  Vitals shown include unvalidated device data.  Last Pain:  Vitals:   08/16/21 1250  TempSrc:   PainSc: 0-No pain      Patients Stated Pain Goal: 0 (72/25/75 0518)  Complications: No notable events documented.

## 2021-08-16 NOTE — Plan of Care (Signed)

## 2021-08-16 NOTE — Progress Notes (Signed)
[facility] at [place] NAME: Sherry [name]    MR#:  [id number]  DATE OF BIRTH:  July 13, 1934  SUBJECTIVE:  Seen in pACU Overall surgery went well. S/p 2 unit BT    VITALS:  Blood pressure 103/65, pulse 82, temperature (!) 96.9 F (36.1 C), resp. rate 17, height '5\' 7"'$  (1.702 m), weight 81.6 kg, SpO2 100 %.  PHYSICAL EXAMINATION:   GENERAL:  87 y.o.-year-old patient lying in the bed with no acute distress.  LUNGS: Normal breath sounds bilaterally, no wheezing, rales, rhonchi.  CARDIOVASCULAR: S1, S2 normal. No murmurs, rubs, or gallops.  ABDOMEN: Soft, nontender, nondistended. Bowel sounds present.  EXTREMITIES:left hip dressing + NEUROLOGIC: nonfocal  patient is alert   LABORATORY PANEL:  CBC Recent Labs  Lab 08/15/21 1035  WBC 7.4  HGB 12.9  HCT 41.3  PLT 335    Chemistries  Recent Labs  Lab 08/15/21 1035  NA 136  K 4.3  CL 102  CO2 28  GLUCOSE 108*  BUN 9  CREATININE 0.59  CALCIUM 8.9   Cardiac Enzymes No results for input(s): "TROPONINI" in the last 168 hours. RADIOLOGY:  DG C-Arm 1-60 Min-No Report  Result Date: 08/16/2021 Fluoroscopy was utilized by the requesting physician.  No radiographic interpretation.   DG C-Arm 1-60 Min-No Report  Result Date: 08/16/2021 Fluoroscopy was utilized by the requesting physician.  No radiographic interpretation.   US Venous Img Lower Bilateral (DVT)  Result Date: 08/15/2021 CLINICAL DATA:  Pain, swelling, prior history of DVT EXAM: BILATERAL LOWER EXTREMITY VENOUS DOPPLER ULTRASOUND TECHNIQUE: Gray-scale sonography with graded compression, as well as color Doppler and duplex ultrasound were performed to evaluate the lower extremity deep venous systems from the level of the common femoral vein and including the common femoral, femoral, profunda femoral, popliteal and calf veins including the posterior tibial, peroneal and gastrocnemius veins when visible. The superficial great  saphenous vein was also interrogated. Spectral Doppler was utilized to evaluate flow at rest and with distal augmentation maneuvers in the common femoral, femoral and popliteal veins. COMPARISON:  None Available. FINDINGS: RIGHT LOWER EXTREMITY Common Femoral Vein: No evidence of thrombus. Normal compressibility, respiratory phasicity and response to augmentation. Saphenofemoral Junction: No evidence of thrombus. Normal compressibility and flow on color Doppler imaging. Profunda Femoral Vein: No evidence of thrombus. Normal compressibility and flow on color Doppler imaging. Femoral Vein: No evidence of thrombus. Normal compressibility, respiratory phasicity and response to augmentation. Popliteal Vein: No evidence of thrombus. Normal compressibility, respiratory phasicity and response to augmentation. Calf Veins: Not optimally visualized for evaluation. Superficial Great Saphenous Vein: No evidence of thrombus. Normal compressibility. Venous Reflux:  None. Other Findings:  None. LEFT LOWER EXTREMITY Common Femoral Vein: No evidence of thrombus. Normal compressibility, respiratory phasicity and response to augmentation. Saphenofemoral Junction: No evidence of thrombus. Normal compressibility and flow on color Doppler imaging. Profunda Femoral Vein: No evidence of thrombus. Normal compressibility and flow on color Doppler imaging. Femoral Vein: No evidence of thrombus. Normal compressibility, respiratory phasicity and response to augmentation. Popliteal Vein: Distal left popliteal vein is not optimally visualized. Calf Veins: Not adequately visualized for evaluation. Superficial Great Saphenous Vein: No evidence of thrombus. Normal compressibility. Venous Reflux:  None. Other Findings: Examination was technically difficult due to patient's clinical condition with bilateral hip fractures. IMPRESSION: There is no evidence of acute DVT in major deep veins in both lower extremities. Deep veins in both calves and the  caudal course of left popliteal vein  are not optimally visualized due to patient's clinical condition due to fractures in both proximal femurs. If there are continued symptoms, short-term follow-up study should be considered. Electronically Signed   By: Elmer Picker M.D.   On: 08/15/2021 18:32   DG Hip Unilat With Pelvis 2-3 Views Left  Result Date: 08/15/2021 CLINICAL DATA:  Fall.  Acute left hip pain. EXAM: DG HIP (WITH OR WITHOUT PELVIS) 2-3V LEFT; DG HIP (WITH OR WITHOUT PELVIS) 2-3V RIGHT COMPARISON:  CT of the abdomen and pelvis 01/21/2020 FINDINGS: Left proximal femur fracture noted just below the intertrochanteric region. Marked ventral angulation noted on the cross-table image. Slight varus angulation present Comminuted intratrochanteric fracture present on the right also with marked ventral angulation. IMPRESSION: 1. Bilateral hip fractures. 2. Comminuted intertrochanteric fracture of the right hip. 3. Angulated fracture of the proximal left femur just below the intertrochanteric region. Electronically Signed   By: San Morelle M.D.   On: 08/15/2021 11:54   DG HIP UNILAT WITH PELVIS 2-3 VIEWS RIGHT  Result Date: 08/15/2021 CLINICAL DATA:  Fall.  Acute left hip pain. EXAM: DG HIP (WITH OR WITHOUT PELVIS) 2-3V LEFT; DG HIP (WITH OR WITHOUT PELVIS) 2-3V RIGHT COMPARISON:  CT of the abdomen and pelvis 01/21/2020 FINDINGS: Left proximal femur fracture noted just below the intertrochanteric region. Marked ventral angulation noted on the cross-table image. Slight varus angulation present Comminuted intratrochanteric fracture present on the right also with marked ventral angulation. IMPRESSION: 1. Bilateral hip fractures. 2. Comminuted intertrochanteric fracture of the right hip. 3. Angulated fracture of the proximal left femur just below the intertrochanteric region. Electronically Signed   By: San Morelle M.D.   On: 08/15/2021 11:54   DG Chest 1 View  Result Date:  08/15/2021 CLINICAL DATA:  87 year old female history of trauma from a fall. EXAM: CHEST  1 VIEW COMPARISON:  None Available. FINDINGS: Lung volumes are normal. No consolidative airspace disease. No pleural effusions. No pneumothorax. No pulmonary nodule or mass noted. Pulmonary vasculature and the cardiomediastinal silhouette are within normal limits. Orthopedic fixation hardware in the lower cervical spine incidentally noted. IMPRESSION: 1.  No radiographic evidence of acute cardiopulmonary disease. Electronically Signed   By: Vinnie Langton M.D.   On: 08/15/2021 11:48    Assessment and Plan  Sherry [name] is a 87 y.o. 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  Patient has history of quadriplegia, and has been doing PT/OT with improvement of function -X-ray of hip/pelvis: 1. Bilateral hip fractures. 2. Comminuted intertrochanteric fracture of the right hip. 3. Angulated fracture of the proximal left femur just below the intertrochanteric region.  Closed hip fracture [facility]) Patient has a bilateral hip fracture as evidenced by x-ray. Patient has moderate to sever pain now. No neurovascular compromise. Orthopedic surgeon, Dr. Sabra Heck was consulted.  --pt is s/p  INTRAMEDULLARY (IM) NAIL FEMORAL-left  - Pain control: morphine prn and percocet - When necessary Zofran for nausea - Robaxin for muscle spasm -- PT/OT when able to (not ordered now) --per Dr Sabra Heck pt received 2 units BT in OR   Fall -pt/ot when able to -fall precaution.   DVT (deep venous thrombosis) ([place]) --Patient is on prophylaxis dose Xarelto 10 mg daily on hold -- Patient is allergic to pork-developed Pradaxa, cannot use IV heparin.  --Lower extremity venous  Doppler was done which showed no  evidence of acute DVT in major deep veins in both lower extremities. Pt is s/p of IVC filter. - cont SCD before/after surgery -After surgery, need to restart Xarelto per Dr Sabra Heck from 7/17 if hgb remains stable   Essential hypertension -IV hydralazine as needed -Spironolactone   Depression with anxiety - Continue  Cymbalta   Quadriplegia ([place]) - Fall precaution -Continue muscle relaxer, tizanidine -Continue Neurontin      Procedures: s/p INTRAMEDULLARY (IM) NAIL FEMORAL-left --08/16/2021 Family communication :Dr Sabra Heck spoke with dter Consults :ortho CODE STATUS: full DVT Prophylaxis :SCD for now Level of care: Med-Surg Status is: Inpatient Remains inpatient appropriate because: POD#0 hip surgery    TOTAL TIME TAKING CARE OF THIS PATIENT: 35 minutes.  >50% time spent on counselling and coordination of care  Note: This dictation was prepared with Dragon dictation along with smaller phrase technology. Any transcriptional errors that result from this process are unintentional.  Fritzi Mandes M.D    [facility]   CC: Primary care physician; Venia Carbon, MD

## 2021-08-16 NOTE — Progress Notes (Signed)
Patient of the floor for surgery.

## 2021-08-16 NOTE — Anesthesia Procedure Notes (Signed)
Procedure Name: Intubation Date/Time: 08/16/2021 9:08 AM  Performed by: Kelton Pillar, CRNAPre-anesthesia Checklist: Patient identified, Emergency Drugs available, Suction available and Patient being monitored Patient Re-evaluated:Patient Re-evaluated prior to induction Oxygen Delivery Method: Circle system utilized Preoxygenation: Pre-oxygenation with 100% oxygen Induction Type: IV induction Ventilation: Mask ventilation without difficulty Laryngoscope Size: McGraph and 3 Grade View: Grade I Tube type: Oral Tube size: 6.5 mm Number of attempts: 1 Airway Equipment and Method: Stylet and Oral airway Placement Confirmation: ETT inserted through vocal cords under direct vision, positive ETCO2, breath sounds checked- equal and bilateral and CO2 detector Secured at: 21 cm Tube secured with: Tape Dental Injury: Teeth and Oropharynx as per pre-operative assessment

## 2021-08-16 NOTE — Op Note (Signed)
DATE OF SURGERY:  08/16/2021  TIME: 12:43 PM  PATIENT NAME:  Sherry [name]  AGE: 87 y.o.  PRE-OPERATIVE DIAGNOSIS:  Left hip fracture-displaced spiral subtrochanteric fracture  POST-OPERATIVE DIAGNOSIS:  SAME  PROCEDURE:  INTRAMEDULLARY (IM) NAIL FEMORAL-left TFN (360 mm x 11 mm, 938 mm helical blade, 42 mm distal locking screw).  One 2.0 Dall-Miles cable  SURGEON:  Park Breed  ASST:  EBL: 600 cc Replaced: 2 units packed red blood cells  COMPLICATIONS: None  OPERATIVE IMPLANTS: Synthes trochanteric femoral nail 360 mm x 11 mm with interlocking helical blade 182 mm and distal locking screw 42 mm.  PREOPERATIVE INDICATIONS:  [name] is a 87 y.o. year old who fell and suffered a hip fracture. She was brought into the ER and then admitted and optimized and then elected for surgical intervention.    The risks benefits and alternatives were discussed with the patient including but not limited to the risks of nonoperative treatment, versus surgical intervention including infection, bleeding, nerve injury, malunion, nonunion, hardware prominence, hardware failure, need for hardware removal, blood clots, cardiopulmonary complications, morbidity, mortality, among others, and they were willing to proceed.    OPERATIVE PROCEDURE:  The patient was brought to the operating room and placed in the supine position.  General endotracheal anesthesia was administered, with a foley. She was placed on the fracture table.  Closed reduction was performed under C-arm guidance. The length of the femur was also measured using fluoroscopy. Time out was then performed after sterile prep and drape. She received preoperative antibiotics.  Preoperatively, this fracture was known to be difficult to reduce and hold.  Therefore an extensile incision was made incision was made proximal to the greater trochanter and extending distally soft tissue dissection was carried out.  Manual reduction of the  long spiral proximal and distal fragments was carried out and Trickey claw clamp applied to provide stability.. A guidewire was placed in the appropriate position down to the distal physeal plate.  Confirmation was made on AP and lateral views. The above-named nail was opened. I had opened the proximal femur with a reamer and the femoral shaft was then reamed sequentially to 12-1/2 mm.  This was adequate to insert a 11 mm nail.  Once the nail was completely seated, I placed a guidepin into the femoral head into the center center position through a second incision.  I measured the length, and then reamed the lateral cortex and up into the head. I then placed the helical blade. Slight compression was applied. Bone quality was poor.  I then secured the proximal interlock.  The distal locking screw was then placed through the largest of the distal locking screws slots.  And after confirming the position of the fracture fragments and hardware I then placed a 2.0 mm Dall-Miles cable around the proximal femur just below the lesser trochanter.  Fluoroscopy showed all hardware to be in good position and fracture to be stabilized satisfactorily.  I then removed the instruments, and took final C-arm pictures AP and lateral the entire length of the leg.  2 medium Hemovac drains were left in the wound.  Thorough irrigation of the wounds was carried out.  The wounds were irrigated copiously and closed with Vicryl  followed by staples and dry sterile dressing. Sponge and needle count were correct.   The patient was awakened and returned to PACU in stable and satisfactory condition. There no complications and the patient tolerated the procedure well.  She will  be partial weightbearing as tolerated, and will be on Lovenox  For DVT prophylaxis.     Park Breed, M.D.

## 2021-08-16 NOTE — Plan of Care (Signed)
°  Problem: Nutrition: °Goal: Adequate nutrition will be maintained °Outcome: Progressing °  °Problem: Coping: °Goal: Level of anxiety will decrease °Outcome: Progressing °  °Problem: Safety: °Goal: Ability to remain free from injury will improve °Outcome: Progressing °  °

## 2021-08-16 NOTE — Plan of Care (Signed)
  Problem: Coping: Goal: Level of anxiety will decrease Outcome: Progressing   Problem: Pain Managment: Goal: General experience of comfort will improve Outcome: Progressing   

## 2021-08-16 NOTE — Progress Notes (Signed)
Pt. Back to room from surgery

## 2021-08-17 ENCOUNTER — Ambulatory Visit: Payer: Medicare HMO

## 2021-08-17 ENCOUNTER — Encounter: Payer: Self-pay | Admitting: Occupational Therapy

## 2021-08-17 ENCOUNTER — Ambulatory Visit: Payer: Medicare HMO | Admitting: Occupational Therapy

## 2021-08-17 DIAGNOSIS — S72009A Fracture of unspecified part of neck of unspecified femur, initial encounter for closed fracture: Secondary | ICD-10-CM | POA: Diagnosis not present

## 2021-08-17 LAB — CBC
HCT: 28.6 % — ABNORMAL LOW (ref 36.0–46.0)
Hemoglobin: 9.5 g/dL — ABNORMAL LOW (ref 12.0–15.0)
MCH: 28.9 pg (ref 26.0–34.0)
MCHC: 33.2 g/dL (ref 30.0–36.0)
MCV: 86.9 fL (ref 80.0–100.0)
Platelets: 214 10*3/uL (ref 150–400)
RBC: 3.29 MIL/uL — ABNORMAL LOW (ref 3.87–5.11)
RDW: 14.6 % (ref 11.5–15.5)
WBC: 14.9 10*3/uL — ABNORMAL HIGH (ref 4.0–10.5)
nRBC: 0 % (ref 0.0–0.2)

## 2021-08-17 LAB — BLOOD PRODUCT ORDER (VERBAL) VERIFICATION

## 2021-08-17 LAB — BASIC METABOLIC PANEL
Anion gap: 8 (ref 5–15)
BUN: 24 mg/dL — ABNORMAL HIGH (ref 8–23)
CO2: 23 mmol/L (ref 22–32)
Calcium: 7.3 mg/dL — ABNORMAL LOW (ref 8.9–10.3)
Chloride: 100 mmol/L (ref 98–111)
Creatinine, Ser: 1.07 mg/dL — ABNORMAL HIGH (ref 0.44–1.00)
GFR, Estimated: 51 mL/min — ABNORMAL LOW (ref >60)
Glucose, Bld: 155 mg/dL — ABNORMAL HIGH (ref 70–99)
Potassium: 5 mmol/L (ref 3.5–5.1)
Sodium: 131 mmol/L — ABNORMAL LOW (ref 135–145)

## 2021-08-17 MED ORDER — RIVAROXABAN 10 MG PO TABS
10.0000 mg | ORAL_TABLET | Freq: Every day | ORAL | Status: DC
Start: 2021-08-17 — End: 2021-08-20
  Administered 2021-08-17 – 2021-08-19 (×3): 10 mg via ORAL
  Filled 2021-08-17 (×4): qty 1

## 2021-08-17 NOTE — Progress Notes (Signed)
Subjective: 1 Day Post-Op Procedure(s) (LRB): INTRAMEDULLARY (IM) NAIL FEMORAL (Left) The patient is alert and awake awake and sitting up in bed.  She is comfortable.  Her brother is at the bedside.  Hemoglobin is 9.5.  Hemovacs were removed.  Medicine will restart her Xarelto tonight.  PT will mobilize her cautiously bed to chair.  The left hip is well stabilized now.  The right hip remains uncertain as her her appearance is of chronic chronic chronic nature there.  Patient has no pain whatsoever with movement of the right hip.  The leg is shortened as is common with a intertrochanteric fracture.  She has some sensation and motion in the right foot consistent with the left leg.  The left hip was extremely painful and was obviously acute.  We will reevaluate her x-rays and discussed this with my partners and decide whether or not any intervention should be attempted on the right.  We will allow her day or 2 to recover from the first surgery which was quite extensive.  Patient reports pain as mild.  Objective:   VITALS:   Vitals:   08/17/21 1901 08/17/21 1909  BP: (!) 84/54 (!) 90/50  Pulse: 81   Resp: 16   Temp: 98.4 F (36.9 C)   SpO2: 95%     Incision: dressing C/D/I  LABS Recent Labs    08/15/21 1035 08/16/21 1333 08/17/21 0443  HGB 12.9 10.1* 9.5*  HCT 41.3 31.9* 28.6*  WBC 7.4 16.9* 14.9*  PLT 335 206 214    Recent Labs    08/15/21 1035 08/17/21 0443  NA 136 131*  K 4.3 5.0  BUN 9 24*  CREATININE 0.59 1.07*  GLUCOSE 108* 155*    Recent Labs    08/15/21 1035  INR 1.2     Assessment/Plan: 1 Day Post-Op Procedure(s) (LRB): INTRAMEDULLARY (IM) NAIL FEMORAL (Left)   Advance diet Up with therapy D/C IV fluids

## 2021-08-17 NOTE — Plan of Care (Signed)
  Problem: Clinical Measurements: Goal: Ability to maintain clinical measurements within normal limits will improve Outcome: Progressing Goal: Will remain free from infection Outcome: Progressing Goal: Cardiovascular complication will be avoided Outcome: Progressing   Problem: Activity: Goal: Risk for activity intolerance will decrease Outcome: Progressing   Problem: Nutrition: Goal: Adequate nutrition will be maintained Outcome: Progressing   Problem: Elimination: Goal: Will not experience complications related to bowel motility Outcome: Progressing

## 2021-08-17 NOTE — Progress Notes (Signed)
[facility] at [place] NAME: Sherry [name]    MR#:  [id number]  DATE OF BIRTH:  [birth year]  SUBJECTIVE:   daughter at bedside. Patient overall doing okay. She is eating breakfast.    VITALS:  Blood pressure (!) 110/58, pulse 88, temperature 98.1 F (36.7 C), temperature source Oral, resp. rate 16, height '5\' 7"'$  (1.702 m), weight 81.6 kg, SpO2 99 %.  PHYSICAL EXAMINATION:   GENERAL:  87 y.o.-year-old patient lying in the bed with no acute distress.  LUNGS: Normal breath sounds bilaterally, no wheezing, rales, rhonchi.  CARDIOVASCULAR: S1, S2 normal. No murmurs, rubs, or gallops.  ABDOMEN: Soft, nontender, nondistended. Bowel sounds present. Chronic Foley + EXTREMITIES:left hip dressing + contractures and upper extremity due to functional quadriplegia NEUROLOGIC: nonfocal  patient is alert   LABORATORY PANEL:  CBC Recent Labs  Lab 08/17/21 0443  WBC 14.9*  HGB 9.5*  HCT 28.6*  PLT 214     Chemistries  Recent Labs  Lab 08/17/21 0443  NA 131*  K 5.0  CL 100  CO2 23  GLUCOSE 155*  BUN 24*  CREATININE 1.07*  CALCIUM 7.3*    Cardiac Enzymes No results for input(s): "TROPONINI" in the last 168 hours. RADIOLOGY:  DG HIP UNILAT WITH PELVIS 2-3 VIEWS LEFT  Result Date: 08/16/2021 CLINICAL DATA:  Left hip fracture repair Images: 15 Cumulative air kerma: 15.471 mGy EXAM: DG HIP (WITH OR WITHOUT PELVIS) 2-3V LEFT COMPARISON:  August 15, 2021 FINDINGS: Fifteen images were obtained during left hip fracture repair. By the end of the study, a gamma nail and femoral rod have been placed with a proximal cerclage wire. A distal interlocking screw is noted. IMPRESSION: Left hip fracture repair as above. Electronically Signed   By: Dorise Bullion III M.D.   On: 08/16/2021 14:14   DG C-Arm 1-60 Min-No Report  Result Date: 08/16/2021 Fluoroscopy was utilized by the requesting physician.  No radiographic interpretation.   DG C-Arm 1-60  Min-No Report  Result Date: 08/16/2021 Fluoroscopy was utilized by the requesting physician.  No radiographic interpretation.   US Venous Img Lower Bilateral (DVT)  Result Date: 08/15/2021 CLINICAL DATA:  Pain, swelling, prior history of DVT EXAM: BILATERAL LOWER EXTREMITY VENOUS DOPPLER ULTRASOUND TECHNIQUE: Gray-scale sonography with graded compression, as well as color Doppler and duplex ultrasound were performed to evaluate the lower extremity deep venous systems from the level of the common femoral vein and including the common femoral, femoral, profunda femoral, popliteal and calf veins including the posterior tibial, peroneal and gastrocnemius veins when visible. The superficial great saphenous vein was also interrogated. Spectral Doppler was utilized to evaluate flow at rest and with distal augmentation maneuvers in the common femoral, femoral and popliteal veins. COMPARISON:  None Available. FINDINGS: RIGHT LOWER EXTREMITY Common Femoral Vein: No evidence of thrombus. Normal compressibility, respiratory phasicity and response to augmentation. Saphenofemoral Junction: No evidence of thrombus. Normal compressibility and flow on color Doppler imaging. Profunda Femoral Vein: No evidence of thrombus. Normal compressibility and flow on color Doppler imaging. Femoral Vein: No evidence of thrombus. Normal compressibility, respiratory phasicity and response to augmentation. Popliteal Vein: No evidence of thrombus. Normal compressibility, respiratory phasicity and response to augmentation. Calf Veins: Not optimally visualized for evaluation. Superficial Great Saphenous Vein: No evidence of thrombus. Normal compressibility. Venous Reflux:  None. Other Findings:  None. LEFT LOWER EXTREMITY Common Femoral Vein: No evidence of thrombus. Normal compressibility, respiratory phasicity and response to augmentation. Saphenofemoral Junction: No  evidence of thrombus. Normal compressibility and flow on color Doppler  imaging. Profunda Femoral Vein: No evidence of thrombus. Normal compressibility and flow on color Doppler imaging. Femoral Vein: No evidence of thrombus. Normal compressibility, respiratory phasicity and response to augmentation. Popliteal Vein: Distal left popliteal vein is not optimally visualized. Calf Veins: Not adequately visualized for evaluation. Superficial Great Saphenous Vein: No evidence of thrombus. Normal compressibility. Venous Reflux:  None. Other Findings: Examination was technically difficult due to patient's clinical condition with bilateral hip fractures. IMPRESSION: There is no evidence of acute DVT in major deep veins in both lower extremities. Deep veins in both calves and the caudal course of left popliteal vein are not optimally visualized due to patient's clinical condition due to fractures in both proximal femurs. If there are continued symptoms, short-term follow-up study should be considered. Electronically Signed   By: Elmer Picker M.D.   On: 08/15/2021 18:32    Assessment and Plan  Sherry [name] is a 87 y.o. 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  Patient has history of quadriplegia, and has been doing PT/OT with improvement of function -X-ray of hip/pelvis: 1. Bilateral hip fractures. 2. Comminuted intertrochanteric fracture of the right hip. 3. Angulated fracture of the proximal left femur just below the intertrochanteric region.  Closed hip fracture [facility]) Patient has a bilateral hip fracture as evidenced by x-ray. Patient has moderate to sever pain now. No neurovascular compromise. Orthopedic surgeon, Dr. Sabra Heck was consulted.  --pt is s/p  INTRAMEDULLARY (IM) NAIL FEMORAL-left  - Pain control: morphine prn and percocet - When  necessary Zofran for nausea - Robaxin for muscle spasm -- PT/OT when able to (not ordered now) --s/p 2 unit BT during surgery --hgb 12.0--2 units BT--9.4 --ok to start Xarelto per Dr Sabra Heck  Lytle Michaels -pt/ot today -fall precaution.   DVT (deep venous thrombosis) ([place]) --Patient is on prophylaxis dose Xarelto 10 mg daily on hold -- Patient is allergic to pork-developed Pradaxa, cannot use IV heparin.  --Lower extremity venous Doppler was done which showed no evidence of acute DVT in major deep veins in both lower extremities. Pt is s/p of IVC filter. - cont SCD before/after surgery   Essential hypertension -IV hydralazine as needed -Spironolactone   Depression with anxiety - Continue  Cymbalta   Quadriplegia ([place]) - Fall precaution -Continue muscle relaxer, tizanidine -Continue Neurontin      Procedures: s/p INTRAMEDULLARY (IM) NAIL FEMORAL-left --08/16/2021 Family communication :spoke with dter at bedside Consults :ortho CODE STATUS: full DVT Prophylaxis :Xarelto Level of care: Med-Surg Status is: Inpatient Remains inpatient appropriate because: POD# 1 hip surgery PT/OT to see pt TOC for d/c planning    TOTAL TIME TAKING CARE OF THIS PATIENT: 35 minutes.  >50% time spent on counselling and coordination of care  Note: This dictation was prepared with Dragon dictation along with smaller phrase technology. Any transcriptional errors that result from this process are unintentional.  Fritzi Mandes M.D    [facility]   CC: Primary care physician; Venia Carbon, MD

## 2021-08-17 NOTE — Evaluation (Signed)
Physical Therapy Evaluation Patient Details Name: Sherry [name] MRN: [id number] DOB: 10/25/1934 Today's Date: 08/17/2021  History of Present Illness  Pt is an 87 y/o F admitted on 08/15/21 after presenting with a fall & L hip pain. X-rays revealed B hip fxs. Pt underwent sx for L IM nail on 08/16/21 & is now PWB LLE. PMH: quadraplegia 2/2 central cord syndrome C6 2/2 neck fx, HTN, depression, anxiety, neurogenic bladder, Bilateral DVT   Clinical Impression  Pt seen for PT tx with co-tx with OT & pt's daughter [name]) present during session. Prior to admission pt was residing in [place] at [facility], using hoyer lift to transfer bed<>power w/c, but was working in [employer] able to perform stand pivot transfers with mod assist. On this date, pt required total assist +2 for supine<>sit & max assist for static sitting EOB while pt engaged in grooming tasks & taking medications from nurse. Pt does endorse dizziness after sitting EOB so returned to supine. Educated pt on minimal weight bearing BLE.     Recommendations for follow up therapy are one component of a multi-disciplinary discharge planning process, led by the attending physician.  Recommendations may be updated based on patient status, additional functional criteria and insurance authorization.  Follow Up Recommendations Skilled nursing-short term rehab (<3 hours/day) Can patient physically be transported by private vehicle: No    Assistance Recommended at Discharge Frequent or constant Supervision/Assistance  Patient can return home with the following  Help with stairs or ramp for entrance;Two people to help with bathing/dressing/bathroom;Direct supervision/assist for medications management;Two people to help with walking and/or transfers;Assistance with feeding;Assist for transportation;Direct supervision/assist for financial management;Assistance with cooking/housework    Equipment Recommendations None recommended by PT  Recommendations for  Other Services       Functional Status Assessment Patient has had a recent decline in their functional status and demonstrates the ability to make significant improvements in function in a reasonable and predictable amount of time.     Precautions / Restrictions Precautions Precautions: Fall Precaution Comments: foley catheter Required Braces or Orthoses: Other Brace Other Brace: Wears bilateral AFOs baseline Restrictions Weight Bearing Restrictions: Yes RLE Weight Bearing: Partial weight bearing LLE Weight Bearing: Partial weight bearing Other Position/Activity Restrictions: per secure chat with MD "minimal weight on either leg" for 8-10 weeks (requested formal orders be placed in chart)      Mobility  Bed Mobility Overal bed mobility: Needs Assistance Bed Mobility: Supine to Sit, Sit to Supine     Supine to sit: Total assist, +2 for physical assistance, HOB elevated Sit to supine: Total assist, +2 for physical assistance, HOB elevated        Transfers                        Ambulation/Gait                  Stairs            Wheelchair Mobility    Modified Rankin (Stroke Patients Only)       Balance Overall balance assessment: Needs assistance Sitting-balance support: Bilateral upper extremity supported, Feet supported Sitting balance-Leahy Scale: Poor Sitting balance - Comments: max assist static sitting EOB 2/2 posterior lean, pt declined attempting to sit without support 2/2 feeling dizzy so assisted back supine  Pertinent Vitals/Pain Pain Assessment Pain Assessment: No/denies pain    Home Living Family/patient expects to be discharged to:: Skilled nursing facility                   Additional Comments: Pt is from [facility]    Prior Function Prior Level of Function : Needs assist       Physical Assist : Mobility (physical);ADLs (physical)   ADLs  (physical): Bathing;Dressing;Grooming;Toileting Mobility Comments: Staff assisted pt with bed<>power w/c with hoyer lift, pt maneuvered power w/c with hand controls. Was working with OPPT & could perform stand pivot transfers with mod assist. ADLs Comments: Staff assisted with bed baths daily. Pt reports she could feed herself & sit EOB prior to admission. Could brush sides of hair but needed help for the back.     Hand Dominance        Extremity/Trunk Assessment   Upper Extremity Assessment Upper Extremity Assessment: Generalized weakness (decreased grasp BUE; hx of C6 central cord syndrome)    Lower Extremity Assessment Lower Extremity Assessment: Generalized weakness (pt with BLE resting in plantarflexion, PROM to almost neutral, little to no activation noted in either BLE against gravity in supine; pt reports absent sensation when PT performs LLE PROM at ankle/foot)       Communication   Communication: No difficulties  Cognition Arousal/Alertness: Awake/alert Behavior During Therapy: WFL for tasks assessed/performed Overall Cognitive Status: Within Functional Limits for tasks assessed                                          General Comments      Exercises Other Exercises Other Exercises: educated pt's daughter [name] on performing ankle dorsiflexion PROM stretching & discussed possible use of PRAFO boots to maintain ankle dorsiflexion ROM while pt is unable to weight bear   Assessment/Plan    PT Assessment Patient needs continued PT services  PT Problem List Decreased strength;Decreased coordination;Decreased range of motion;Decreased balance;Decreased activity tolerance;Impaired sensation;Decreased mobility;Decreased knowledge of precautions;Decreased safety awareness;Decreased knowledge of use of DME       PT Treatment Interventions DME instruction;Therapeutic exercise;Wheelchair mobility training;Gait training;Modalities;Neuromuscular  re-education;Manual techniques;Balance training;Functional mobility training;Therapeutic activities;Patient/family education    PT Goals (Current goals can be found in the Care Plan section)  Acute Rehab PT Goals Patient Stated Goal: get better PT Goal Formulation: With patient Time For Goal Achievement: 08/31/21 Potential to Achieve Goals: Fair    Frequency Min 2X/week     Co-evaluation   Reason for Co-Treatment: Complexity of the patient's impairments (multi-system involvement);For patient/therapist safety PT goals addressed during session: Mobility/safety with mobility;Balance OT goals addressed during session: ADL's and self-care       AM-PAC PT "6 Clicks" Mobility  Outcome Measure Help needed turning from your back to your side while in a flat bed without using bedrails?: Total Help needed moving from lying on your back to sitting on the side of a flat bed without using bedrails?: Total Help needed moving to and from a bed to a chair (including a wheelchair)?: Total Help needed standing up from a chair using your arms (e.g., wheelchair or bedside chair)?: Total Help needed to walk in hospital room?: Total Help needed climbing 3-5 steps with a railing? : Total 6 Click Score: 6    End of Session   Activity Tolerance:  (limited by fatigue & dizziness with sitting  EOB) Patient left: in bed;with bed alarm set;with call bell/phone within reach;with nursing/sitter in room Nurse Communication: Mobility status PT Visit Diagnosis: Difficulty in walking, not elsewhere classified (R26.2);Other abnormalities of gait and mobility (R26.89)    Time: 1859-0931 PT Time Calculation (min) (ACUTE ONLY): 20 min   Charges:   PT Evaluation $PT Eval High Complexity: 1 High          Lavone Nian, PT, DPT 08/17/21, 12:46 PM   Waunita Schooner 08/17/2021, 12:44 PM

## 2021-08-17 NOTE — Progress Notes (Signed)
Initial Nutrition Assessment  DOCUMENTATION CODES:   Not applicable  INTERVENTION:   -MVI with minerals daily -Feeding assistance with meals  NUTRITION DIAGNOSIS:   Increased nutrient needs related to post-op healing as evidenced by estimated needs.  GOAL:   Patient will meet greater than or equal to 90% of their needs  MONITOR:   PO intake, Supplement acceptance  REASON FOR ASSESSMENT:   Consult Assessment of nutrition requirement/status, Hip fracture protocol  ASSESSMENT:   Pt with medical history significant of quadriplegia due to central cord syndrome of C6 secondary to neck fracture, hypertension, depression with anxiety, neurogenic bladder, bilateral DVT on Xarelto (s/p of IVC filter placement), who presents with fall and hip pain.  Pt admitted with closed lt hip fracture.   7/16- s/p PROCEDURE:  INTRAMEDULLARY (IM) NAIL FEMORAL-left TFN (360 mm x 11 mm, 696 mm helical blade, 42 mm distal locking screw).  One 2.0 Dall-Miles cable  Reviewed I/O's: +1.6 L x 24 hours and +2.5 L since admission  UOP: 175 ml x 24 hours  Spoke with pt and daughter at bedside. Pt reports her appetite "is not ravenous, but improving". Pt typically consumes 3 meals per day (Breakfast: egg or fruit with coffee; Lunch: salad; Dinner: meat, starch, and vegetable). Daughter shares that pt has been following the FODMAP diet for approximately 3 years per the instruction of her GI doctor due to concerns for bloating and neurogenic bladder.    Pt estimates she consumed about 75% of breakfast Noted meal completions documented at 40%.   Reviewed wt hx; wt has been stable. Pt shares she has been trying to lose weight.   Discussed importance of good meal intake to promote healing. Pt with quadriplegia and can feed herself, but needs assistance with fine motor tasks, such as opening containers and cutting up food.   Medications reviewed and include vitamin D3, ferrous sulfate, miralax, metamucil,  aldactone, and senokot.   Labs reviewed: Na: 131.    NUTRITION - FOCUSED PHYSICAL EXAM:  Flowsheet Row Most Recent Value  Orbital Region No depletion  Upper Arm Region Mild depletion  Thoracic and Lumbar Region No depletion  Buccal Region No depletion  Temple Region No depletion  Clavicle Bone Region No depletion  Clavicle and Acromion Bone Region No depletion  Scapular Bone Region No depletion  Dorsal Hand No depletion  Patellar Region No depletion  Anterior Thigh Region No depletion  Posterior Calf Region No depletion  Edema (RD Assessment) Mild  Hair Reviewed  Eyes Reviewed  Mouth Reviewed  Skin Reviewed  Nails Reviewed       Diet Order:   Diet Order             Diet regular Room service appropriate? Yes; Fluid consistency: Thin  Diet effective now                   EDUCATION NEEDS:   Education needs have been addressed  Skin:  Skin Assessment: Skin Integrity Issues: Skin Integrity Issues:: Incisions Incisions: closed lt hip  Last BM:  08/15/21  Height:   Ht Readings from Last 1 Encounters:  08/15/21 '5\' 7"'$  (1.702 m)    Weight:   Wt Readings from Last 1 Encounters:  08/15/21 81.6 kg   BMI:  Body mass index is 28.19 kg/m.  Estimated Nutritional Needs:   Kcal:  1800-2000  Protein:  90-105 grams  Fluid:  > 1.8 L    Loistine Chance, RD, LDN, [place] Registered Dietitian II Certified Diabetes Care  and Education Specialist Please refer to [facility] for RD and/or RD on-call/weekend/after hours pager

## 2021-08-17 NOTE — Therapy (Signed)
[place] MAIN [facility] SERVICES [address] [place], [place], [address] Phone: [phone]   Fax:  [phone]  August 17, 2021    No Recipients  Occupational Therapy Discharge Summary   Patient: Sherry [name] MRN: [id number] Date of Birth: 1934-03-30  The above patient had been seen in outpatient Occupational Therapy for 137 treatment sessions for ADL training, A/E training, neuromuscular re-education, positioning, there. Ex, and pt./caregiver education. The patient has been admitted to [facility] secondary to sustaining bilateral Hip fractures following a fall. Outpatient OT services are discharged at this time.   Sincerely,  Harrel Carina, MS, OTR/L   Harrel Carina, OT  CC No Recipients  [place] MAIN [facility] SERVICES [address] [place], [place], [address] Phone: [phone]   Fax:  [phone]  Patient: Sherry [name] MRN: [id number] Date of Birth: 07-Sep-1934

## 2021-08-17 NOTE — Anesthesia Postprocedure Evaluation (Signed)
Anesthesia Post Note  Patient: Sherry [name]  Procedure(s) Performed: INTRAMEDULLARY (IM) NAIL FEMORAL (Left: Hip)  Patient location during evaluation: PACU Anesthesia Type: General Level of consciousness: awake and alert Pain management: pain level controlled Vital Signs Assessment: post-procedure vital signs reviewed and stable Respiratory status: spontaneous breathing, nonlabored ventilation and respiratory function stable Cardiovascular status: blood pressure returned to baseline and stable Postop Assessment: no apparent nausea or vomiting Anesthetic complications: no   No notable events documented.   Last Vitals:  Vitals:   08/17/21 0450 08/17/21 0802  BP: (!) 125/59 (!) 110/58  Pulse: 92 88  Resp: 20 16  Temp: 36.7 C 36.7 C  SpO2: 95% 99%    Last Pain:  Vitals:   08/17/21 1214  TempSrc:   PainSc: [place]

## 2021-08-17 NOTE — Evaluation (Signed)
Occupational Therapy Evaluation Patient Details Name: Sherry [name] MRN: [id number] DOB: 01/31/1935 Today's Date: 08/17/2021   History of Present Illness Pt is an 87 y/o F admitted on 08/15/21 after presenting with a fall & L hip pain. X-rays revealed B hip fxs. Pt underwent sx for L IM nail on 08/16/21 & is now PWB LLE. PMH: quadraplegia 2/2 central cord syndrome C6 2/2 neck fx, HTN, depression, anxiety, neurogenic bladder, Bilateral DVT   Clinical Impression   Pt was seen for PT/OT co-evaluation this date. Prior to hospital admission, pt was using a motorized w/c for mobility - required lift assistance for t/f to w/c however progressed to MOD A pivot t/f with outpatient PT. Assist for all ADLs. Pt lives at [facility]. Pt presents to acute OT demonstrating impaired ADL performance and functional mobility 2/2 decreased activity tolerance and bilateral hip fx - functional strength/ROM/balance deficits. Pt currently requires TOTAL A +2 for supine<>sit and MAX A for sitting balance EOB. Pt required MIN A for grooming tasks at EOB with trunk support - needs assistance with combing back of head, but pt reported this is baseline. Pt would benefit from skilled OT to address noted impairments and functional limitations (see below for any additional details). Upon hospital discharge, recommend STR to maximize pt safety and return to PLOF.      Recommendations for follow up therapy are one component of a multi-disciplinary discharge planning process, led by the attending physician.  Recommendations may be updated based on patient status, additional functional criteria and insurance authorization.   Follow Up Recommendations  Skilled nursing-short term rehab (<3 hours/day)    Assistance Recommended at Discharge Frequent or constant Supervision/Assistance  Patient can return home with the following Two people to help with walking and/or transfers;Two people to help with bathing/dressing/bathroom;Assistance  with cooking/housework;Assist for transportation    Functional Status Assessment  Patient has had a recent decline in their functional status and demonstrates the ability to make significant improvements in function in a reasonable and predictable amount of time.  Equipment Recommendations  Other (comment) (defer to next venue of care)    Recommendations for Other Services       Precautions / Restrictions Precautions Precautions: Fall Precaution Comments: foley catheter Required Braces or Orthoses: Other Brace Other Brace: Wears bilateral AFOs baseline Restrictions Weight Bearing Restrictions: Yes RLE Weight Bearing: Partial weight bearing LLE Weight Bearing: Partial weight bearing Other Position/Activity Restrictions: per secure chat with MD "minimal weight on either leg" for 8-10 weeks (requested formal orders be placed in chart)      Mobility Bed Mobility Overal bed mobility: Needs Assistance Bed Mobility: Supine to Sit, Sit to Supine     Supine to sit: Total assist, +2 for physical assistance, HOB elevated Sit to supine: Total assist, +2 for physical assistance, HOB elevated        Transfers                   General transfer comment: Pt remained EOB during session      Balance Overall balance assessment: Needs assistance Sitting-balance support: No upper extremity supported, Feet supported Sitting balance-Leahy Scale: Fair Sitting balance - Comments: Required MAX A for trunk support to maintain sitting balance                                   ADL either performed or assessed with clinical judgement   ADL Overall  ADL's : Needs assistance/impaired                                       General ADL Comments: Pt required MAX A for sitting balance and MIN A for combing back of head.      Pertinent Vitals/Pain Pain Assessment Pain Assessment: No/denies pain     Hand Dominance     Extremity/Trunk Assessment Upper  Extremity Assessment Upper Extremity Assessment: Generalized weakness (decreased grasp BUE; hx of C6 central cord syndrome)   Lower Extremity Assessment Lower Extremity Assessment: Generalized weakness (pt with BLE resting in plantarflexion, PROM to almost neutral, little to no activation noted in either BLE against gravity in supine; pt reports absent sensation when PT performs LLE PROM at ankle/foot)       Communication Communication Communication: No difficulties   Cognition Arousal/Alertness: Awake/alert Behavior During Therapy: WFL for tasks assessed/performed Overall Cognitive Status: Within Functional Limits for tasks assessed                                                  Home Living Family/patient expects to be discharged to:: Skilled nursing facility                                 Additional Comments: Pt is from [facility] long term care      Prior Functioning/Environment Prior Level of Function : Needs assist       Physical Assist : Mobility (physical);ADLs (physical)   ADLs (physical): Bathing;Dressing;Grooming;Toileting Mobility Comments: Staff assisted pt with bed<>power w/c with hoyer lift, pt maneuvered power w/c with hand controls. Was working with OPPT & could perform stand pivot transfers with mod assist. ADLs Comments: Staff assisted with bed baths daily. Pt reports she could feed herself & sit EOB prior to admission. Could brush sides of hair but needed help for the back.        OT Problem List: Decreased strength;Decreased range of motion;Decreased activity tolerance;Impaired balance (sitting and/or standing)      OT Treatment/Interventions: Self-care/ADL training;Therapeutic exercise;Energy conservation;DME and/or AE instruction;Therapeutic activities;Patient/family education;Balance training    OT Goals(Current goals can be found in the care plan section) Acute Rehab OT Goals Patient Stated Goal: to return to  PLOF OT Goal Formulation: With patient Time For Goal Achievement: 08/31/21 Potential to Achieve Goals: Fair ADL Goals Pt Will Perform Grooming: sitting;with min assist;with caregiver independent in assisting Pt Will Perform Upper Body Dressing: with caregiver independent in assisting;with max assist;sitting Pt Will Transfer to Toilet: stand pivot transfer;regular height toilet;with max assist;with +2 assist (with raised height)  OT Frequency: Min 2X/week    Co-evaluation PT/OT/SLP Co-Evaluation/Treatment: Yes Reason for Co-Treatment: Complexity of the patient's impairments (multi-system involvement);For patient/therapist safety PT goals addressed during session: Mobility/safety with mobility;Balance OT goals addressed during session: ADL's and self-care      AM-PAC OT "6 Clicks" Daily Activity     Outcome Measure Help from another person eating meals?: A Little Help from another person taking care of personal grooming?: A Little Help from another person toileting, which includes using toliet, bedpan, or urinal?: Total Help from another person bathing (including washing, rinsing, drying)?: A Lot Help from another person to  put on and taking off regular upper body clothing?: A Lot Help from another person to put on and taking off regular lower body clothing?: Total 6 Click Score: 12   End of Session    Activity Tolerance: Patient tolerated treatment well Patient left: in bed;with call bell/phone within reach;with bed alarm set;with family/visitor present  OT Visit Diagnosis: Muscle weakness (generalized) (M62.81)                Time: 5868-2574 OT Time Calculation (min): 20 min Charges:  OT General Charges $OT Visit: 1 Visit OT Evaluation $OT Eval Moderate Complexity: 1 Mod OT Treatments $Self Care/Home Management : 8-22 mins  [employer], OTDS  [employer] 08/17/2021, 1:39 PM

## 2021-08-18 DIAGNOSIS — S72009A Fracture of unspecified part of neck of unspecified femur, initial encounter for closed fracture: Secondary | ICD-10-CM | POA: Diagnosis not present

## 2021-08-18 LAB — PREPARE RBC (CROSSMATCH)

## 2021-08-18 LAB — CBC
HCT: 22.7 % — ABNORMAL LOW (ref 36.0–46.0)
Hemoglobin: 7.7 g/dL — ABNORMAL LOW (ref 12.0–15.0)
MCH: 29.7 pg (ref 26.0–34.0)
MCHC: 33.9 g/dL (ref 30.0–36.0)
MCV: 87.6 fL (ref 80.0–100.0)
Platelets: 180 10*3/uL (ref 150–400)
RBC: 2.59 MIL/uL — ABNORMAL LOW (ref 3.87–5.11)
RDW: 14.7 % (ref 11.5–15.5)
WBC: 13 10*3/uL — ABNORMAL HIGH (ref 4.0–10.5)
nRBC: 0 % (ref 0.0–0.2)

## 2021-08-18 LAB — BASIC METABOLIC PANEL
Anion gap: 6 (ref 5–15)
BUN: 29 mg/dL — ABNORMAL HIGH (ref 8–23)
CO2: 23 mmol/L (ref 22–32)
Calcium: 7.1 mg/dL — ABNORMAL LOW (ref 8.9–10.3)
Chloride: 95 mmol/L — ABNORMAL LOW (ref 98–111)
Creatinine, Ser: 0.96 mg/dL (ref 0.44–1.00)
GFR, Estimated: 58 mL/min — ABNORMAL LOW (ref >60)
Glucose, Bld: 117 mg/dL — ABNORMAL HIGH (ref 70–99)
Potassium: 4.6 mmol/L (ref 3.5–5.1)
Sodium: 124 mmol/L — ABNORMAL LOW (ref 135–145)

## 2021-08-18 LAB — HEMOGLOBIN AND HEMATOCRIT, BLOOD
HCT: 27.4 % — ABNORMAL LOW (ref 36.0–46.0)
Hemoglobin: 9.1 g/dL — ABNORMAL LOW (ref 12.0–15.0)

## 2021-08-18 MED ORDER — SODIUM CHLORIDE 0.9 % IV SOLN: INTRAVENOUS | Status: AC

## 2021-08-18 MED ORDER — LORATADINE 10 MG PO TABS: 10.0000 mg | ORAL_TABLET | Freq: Every day | ORAL | Status: DC

## 2021-08-18 MED ORDER — SODIUM CHLORIDE 0.9% IV SOLUTION: Freq: Once | INTRAVENOUS | Status: AC

## 2021-08-18 MED ORDER — SALINE SPRAY 0.65 % NA SOLN
2.0000 | NASAL | Status: DC | PRN
Start: 2021-08-18 — End: 2021-08-20

## 2021-08-18 NOTE — Progress Notes (Signed)
Dr. Sidney Ace notified BP taken manually, orderd 283m Bolus of NaCl

## 2021-08-18 NOTE — Progress Notes (Signed)
Dr. Sidney Ace notified about the VS of patient.

## 2021-08-18 NOTE — Progress Notes (Signed)
Physical Therapy Treatment Patient Details Name: Sherry [name] MRN: [id number] DOB: 13-Sep-1934 Today's Date: 08/18/2021   History of Present Illness Pt is an 87 y/o F admitted on 08/15/21 after presenting with a fall & L hip pain. X-rays revealed B hip fxs. Pt underwent sx for L IM nail on 08/16/21 & is now PWB LLE. PMH: quadraplegia 2/2 central cord syndrome C6 2/2 neck fx, HTN, depression, anxiety, neurogenic bladder, Bilateral DVT    PT Comments    Pt seen for PT tx with daughter [name]) present for session. Session focused on BLE strengthening & ROM exercises for increased strength & ROM. Pt is able to activate muscles to attempt ankle pumps & pt able to somewhat assist with heel slides on RLE (only trace muscle activation noted in LLE). Pt still waiting on B PRAFOs.    Recommendations for follow up therapy are one component of a multi-disciplinary discharge planning process, led by the attending physician.  Recommendations may be updated based on patient status, additional functional criteria and insurance authorization.  Follow Up Recommendations  Skilled nursing-short term rehab (<3 hours/day) Can patient physically be transported by private vehicle: No   Assistance Recommended at Discharge Frequent or constant Supervision/Assistance  Patient can return home with the following Help with stairs or ramp for entrance;Two people to help with bathing/dressing/bathroom;Direct supervision/assist for medications management;Two people to help with walking and/or transfers;Assistance with feeding;Assist for transportation;Direct supervision/assist for financial management;Assistance with cooking/housework   Equipment Recommendations  None recommended by PT    Recommendations for Other Services       Precautions / Restrictions Precautions Precautions: Fall Precaution Comments: foley catheter Required Braces or Orthoses: Other Brace Other Brace: Wears bilateral AFOs  baseline Restrictions Weight Bearing Restrictions: Yes RLE Weight Bearing: Partial weight bearing LLE Weight Bearing: Partial weight bearing Other Position/Activity Restrictions: per secure chat with MD "minimal weight on either leg" for 8-10 weeks (requested formal orders be placed in chart, still awaiting those)     Mobility  Bed Mobility                    Transfers                        Ambulation/Gait                   Stairs             Wheelchair Mobility    Modified Rankin (Stroke Patients Only)       Balance                                            Cognition Arousal/Alertness: Awake/alert Behavior During Therapy: WFL for tasks assessed/performed Overall Cognitive Status: Within Functional Limits for tasks assessed                                          Exercises General Exercises - Lower Extremity Ankle Circles/Pumps: AAROM, Both, 10 reps, Supine Heel Slides: AAROM, Strengthening, Both, 10 reps, Supine Hip ABduction/ADduction: AAROM, Strengthening, Both, 10 reps, Supine (hip abduction slides)    General Comments        Pertinent Vitals/Pain Pain Assessment Pain Assessment: No/denies pain    Home Living  Prior Function            PT Goals (current goals can now be found in the care plan section) Acute Rehab PT Goals Patient Stated Goal: get better PT Goal Formulation: With patient Time For Goal Achievement: 08/31/21 Potential to Achieve Goals: Fair Progress towards PT goals: Progressing toward goals    Frequency    Min 2X/week      PT Plan Current plan remains appropriate    Co-evaluation              AM-PAC PT "6 Clicks" Mobility   Outcome Measure  Help needed turning from your back to your side while in a flat bed without using bedrails?: Total Help needed moving from lying on your back to sitting on the side of a  flat bed without using bedrails?: Total Help needed moving to and from a bed to a chair (including a wheelchair)?: Total Help needed standing up from a chair using your arms (e.g., wheelchair or bedside chair)?: Total Help needed to walk in hospital room?: Total Help needed climbing 3-5 steps with a railing? : Total 6 Click Score: 6    End of Session   Activity Tolerance: Patient tolerated treatment well Patient left: in bed;with call bell/phone within reach;with bed alarm set;with SCD's reapplied   PT Visit Diagnosis: Difficulty in walking, not elsewhere classified (R26.2);Other abnormalities of gait and mobility (R26.89)     Time: 1004-1020 PT Time Calculation (min) (ACUTE ONLY): 16 min  Charges:  $Therapeutic Activity: 8-22 mins                     Lavone Nian, PT, DPT 08/18/21, 10:32 AM  Waunita Schooner 08/18/2021, 10:30 AM

## 2021-08-18 NOTE — Plan of Care (Signed)
  Problem: Health Behavior/Discharge Planning: Goal: Ability to manage health-related needs will improve Outcome: Progressing   Problem: Clinical Measurements: Goal: Ability to maintain clinical measurements within normal limits will improve Outcome: Progressing   Problem: Activity: Goal: Risk for activity intolerance will decrease Outcome: Progressing   Problem: Nutrition: Goal: Adequate nutrition will be maintained 08/18/2021 0551 by Odette Horns, RN Outcome: Progressing 08/18/2021 0550 by Odette Horns, RN Outcome: Progressing   Problem: Elimination: Goal: Will not experience complications related to bowel motility 08/18/2021 0551 by Odette Horns, RN Outcome: Progressing 08/18/2021 0550 by Odette Horns, RN Outcome: Progressing

## 2021-08-18 NOTE — Progress Notes (Signed)
[place] at [place] NAME: Amirra Herling    MR#:  [id number]  DATE OF BIRTH:  05/10/1934  SUBJECTIVE:   daughter at bedside. Patient overall doing okay. She is  feeling bloated.  Eating some better   VITALS:  Blood pressure (!) 108/51, pulse (!) 101, temperature 98.3 F (36.8 C), resp. rate 18, height '5\' 7"'$  (1.702 m), weight 81.6 kg, SpO2 93 %.  PHYSICAL EXAMINATION:   GENERAL:  87 y.o.-year-old patient lying in the bed with no acute distress.  LUNGS: Normal breath sounds bilaterally, no wheezing, rales, rhonchi.  CARDIOVASCULAR: S1, S2 normal. No murmurs, rubs, or gallops.  ABDOMEN: Soft, nontender, nondistended. Bowel sounds present. Chronic Foley + EXTREMITIES:left hip dressing + contractures and upper extremity due to functional quadriplegia NEUROLOGIC: nonfocal  patient is alert   LABORATORY PANEL:  CBC Recent Labs  Lab 08/18/21 0423  WBC 13.0*  HGB 7.7*  HCT 22.7*  PLT 180     Chemistries  Recent Labs  Lab 08/18/21 0423  NA 124*  K 4.6  CL 95*  CO2 23  GLUCOSE 117*  BUN 29*  CREATININE 0.96  CALCIUM 7.1*    Cardiac Enzymes No results for input(s): "TROPONINI" in the last 168 hours. RADIOLOGY:  No results found.  Assessment and Plan  Chantay A Pacey is a 87 y.o. 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  Patient has history of quadriplegia, and has been doing PT/OT with improvement of function -X-ray of hip/pelvis: 1. Bilateral hip fractures. 2. Comminuted intertrochanteric fracture of the right hip. 3. Angulated fracture of the proximal left femur just below the intertrochanteric region.  Closed left hip fracture (HCC) s/p mechanical fall Closed right hip fracture  (?old) Patient has a bilateral hip fracture as evidenced by x-ray. Patient has moderate to sever pain now. No neurovascular compromise. Orthopedic surgeon, Dr. Sabra Heck was consulted.  --pt is s/p  INTRAMEDULLARY (IM) NAIL FEMORAL-left  - Pain control: morphine prn and percocet - When necessary Zofran for nausea - Robaxin for muscle spasm -- PT/OT when able to (not ordered now) --s/p 2 unit BT during surgery --hgb 12.0--2 units BT--9.4--7.7--3rd PRBC today --ok to start Xarelto per Dr Sabra Heck   DVT (deep venous thrombosis) [facility]) --Patient is on prophylaxis dose Xarelto 10 mg daily on hold -- Patient is allergic to pork-developed Pradaxa, cannot use IV heparin.  --Lower extremity venous Doppler was done which showed no evidence of acute DVT in major deep veins in both lower extremities. Pt is s/p of IVC filter. --now on xarelto   Essential hypertension -IV hydralazine as needed -Spironolactone   Depression with anxiety - Continue  Cymbalta   H/o Quadriplegia ([place]) - Fall precaution -Continue muscle relaxer, tizanidine -Continue Neurontin      Procedures: s/p INTRAMEDULLARY (IM) NAIL FEMORAL-left --08/16/2021 Family communication :spoke with dter at bedside Consults :ortho CODE STATUS: full DVT Prophylaxis :Xarelto Level of care: Med-Surg Status is: Inpatient Remains inpatient appropriate because: POD# 2 hip surgery TOC for d/c planning    TOTAL TIME TAKING CARE OF THIS PATIENT: 35 minutes.  >50% time spent on counselling and coordination of care  Note: This dictation was prepared with Dragon dictation along with smaller phrase technology. Any transcriptional errors that result from this process are unintentional.  Fritzi Mandes M.D    [facility]   CC: Primary care physician; Venia Carbon, MD

## 2021-08-18 NOTE — Progress Notes (Signed)
Orthopedic Tech Progress Note Patient Details:  RESHMA HOEY 10-25-1934 [id number]  Received called requesting BLE PRAFO/S so I called in that order to [facility]   Patient ID: [name], female   DOB: November 26, 1934, 87 y.o.   MRN: [id number]  Janit Pagan 08/18/2021, 10:34 AM

## 2021-08-18 NOTE — Progress Notes (Signed)
Subjective: 2 Days Post-Op Procedure(s) (LRB): INTRAMEDULLARY (IM) NAIL FEMORAL (Left) The patient is alert awake and oriented and sitting up in bed.  She appears comfortable.  Her daughter is at the bedside.  Hemoglobin is down to 7.7 and she will receive another unit of blood today.  I had a long discussion with the patient and her daughter about treatment of the right-sided hip fracture.  Patient has absolutely no pain there.  The leg is shortened.  They are not sure if that is new or not.  The patient at this point does not wish to pursue surgery on the right hip.  She has undergone major blood loss with the first procedure.  I have been trying to let her recover from that before considering a second operation.  Patient and her daughter will continue to discuss her options and let me know if they wish to proceed with another operation.  Dressings are dry.  Alignment is good.  Patient reports pain as mild.  Objective:   VITALS:   Vitals:   08/18/21 0445 08/18/21 0900  BP: 90/60 (!) 108/56  Pulse:  (!) 101  Resp:  16  Temp:  98.2 F (36.8 C)  SpO2:  95%    Incision: dressing C/D/I  LABS Recent Labs    08/16/21 1333 08/17/21 0443 08/18/21 0423  HGB 10.1* 9.5* 7.7*  HCT 31.9* 28.6* 22.7*  WBC 16.9* 14.9* 13.0*  PLT 206 214 180    Recent Labs    08/17/21 0443 08/18/21 0423  NA 131* 124*  K 5.0 4.6  BUN 24* 29*  CREATININE 1.07* 0.96  GLUCOSE 155* 117*    No results for input(s): "LABPT", "INR" in the last 72 hours.   Assessment/Plan: 2 Days Post-Op Procedure(s) (LRB): INTRAMEDULLARY (IM) NAIL FEMORAL (Left)   Advance diet Up with therapy--I do not want any weight on either leg at this time if at all possible.  She will be bed to chair for 2 to 3 months I think.  Ankle-foot orthoses for both ankles have been ordered. Any further surgery would be done in 2 to 3 days to allow her to recover more.  Xarelto would have to be stopped as well.

## 2021-08-18 NOTE — NC FL2 (Signed)
[place] LEVEL OF CARE SCREENING TOOL     IDENTIFICATION  Patient Name: Sherry [name] Birthdate: 05-Oct-1934 Sex: female Admission Date (Current Location): 08/15/2021  [facility] and [place] Number:  Engineering geologist and Address:  [facility], [address] [place], [place] [address]      Provider Number: [id number]  Attending Physician Name and Address:  Fritzi Mandes, MD  Relative Name and Phone Number:  [name] Daughter [phone]    Current Level of Care: Hospital Recommended Level of Care: [place] Prior Approval Number:    Date Approved/Denied:   PASRR Number:    Discharge Plan: SNF    Current Diagnoses: Patient Active Problem List   Diagnosis Date Noted   Closed hip fracture ([place]) 08/15/2021   DVT (deep venous thrombosis) ([place]) 08/15/2021   Quadriplegia ([place]) 08/15/2021   Fall 08/15/2021   Acute appendicitis with appendiceal abscess 05/30/2018   Hypocalcemia 02/15/2018   Dysuria 02/15/2018   Bilateral lower extremity edema 02/11/2018   Vaginal yeast infection 01/30/2018   Chest tightness 01/27/2018   Healthcare-associated pneumonia 01/25/2018   Pleural effusion, not elsewhere classified 01/25/2018   Acute deep vein thrombosis (DVT) of left lower extremity ([place]) 01/24/2018   Acute deep vein thrombosis (DVT) of right lower extremity ([place]) 01/24/2018   Chronic allergic rhinitis 01/24/2018   Depression with anxiety 01/24/2018   UTI due to Klebsiella species 01/24/2018   Protein-calorie malnutrition, severe ([place]) 01/24/2018   S/P insertion of IVC (inferior vena caval) filter 01/24/2018   Reactive depression    Hypokalemia    Leukocytosis    Essential hypertension    Trauma    Tetraparesis (HCC)    Neuropathic pain    Neurogenic bowel    Neurogenic bladder    Benign essential HTN    Acute blood loss anemia    Central cord syndrome at C6 level of cervical spinal cord (HCC) 11/29/2017   Central  cord syndrome ([place]) 11/29/2017    Orientation RESPIRATION BLADDER Height & Weight     Self, Time, Situation, Place  Normal Continent, External catheter Weight: 81.6 kg Height:  '5\' 7"'$  (170.2 cm)  BEHAVIORAL SYMPTOMS/MOOD NEUROLOGICAL BOWEL NUTRITION STATUS      Continent Diet (see DC summary)  AMBULATORY STATUS COMMUNICATION OF NEEDS Skin   Extensive Assist Verbally Normal                       Personal Care Assistance Level of Assistance  Bathing, Feeding, Dressing Bathing Assistance: Maximum assistance Feeding assistance: Limited assistance Dressing Assistance: Maximum assistance     Functional Limitations Info             SPECIAL CARE FACTORS FREQUENCY  PT (By licensed PT), OT (By licensed OT)     PT Frequency: 5 times per week OT Frequency: 5 imes per week            Contractures Contractures Info: Not present    Additional Factors Info  Code Status, Allergies Code Status Info: full code Allergies Info: Lisinopril-hydrochlorothiazide, Beef-derived Products, Dairy Aid (Tilactase), Lisinopril-hydrochlorothiazide, Pork-derived Products           Current Medications (08/18/2021):  This is the current hospital active medication list Current Facility-Administered Medications  Medication Dose Route Frequency Provider Last Rate Last Admin   0.9 %  sodium chloride infusion (Manually program via Guardrails IV Fluids)   Intravenous Once Fritzi Mandes, MD       0.9 %  sodium chloride infusion   Intravenous Continuous Fritzi Mandes, MD 50 mL/hr at 08/18/21 1010 New Bag at 08/18/21 1010   acetaminophen (TYLENOL) tablet 650 mg  650 mg Oral Q6H PRN Earnestine Leys, MD       alum & mag hydroxide-simeth (MAALOX/MYLANTA) 200-200-20 MG/5ML suspension 30 mL  30 mL Oral Q4H PRN Earnestine Leys, MD       bisacodyl (DULCOLAX) suppository 10 mg  10 mg Rectal Daily PRN Earnestine Leys, MD       capsaicin (ZOSTRIX) 8.786 % cream 1 Application  1 Application Topical QPM Earnestine Leys,  MD       chlorhexidine (HIBICLENS) 4 % liquid   Topical Once Earnestine Leys, MD       Chlorhexidine Gluconate Cloth 2 % PADS 6 each  6 each Topical Daily Fritzi Mandes, MD   6 each at 08/18/21 7672   cholecalciferol (VITAMIN D3) tablet 1,000 Units  1,000 Units Oral Daily Earnestine Leys, MD   1,000 Units at 08/18/21 1003   diphenhydrAMINE (BENADRYL) capsule 25-50 mg  25-50 mg Oral Q6H PRN Earnestine Leys, MD       DULoxetine (CYMBALTA) DR capsule 60 mg  60 mg Oral Daily Earnestine Leys, MD   60 mg at 08/18/21 1002   EPINEPHrine (EPI-PEN) injection 0.3 mg  0.3 mg Intramuscular PRN Earnestine Leys, MD       ferrous sulfate tablet 325 mg  325 mg Oral Q breakfast Earnestine Leys, MD   325 mg at 08/18/21 1003   gabapentin (NEURONTIN) capsule 300 mg  300 mg Oral q AM Earnestine Leys, MD   300 mg at 08/18/21 0947   gabapentin (NEURONTIN) tablet 600 mg  600 mg Oral QHS Earnestine Leys, MD   600 mg at 08/17/21 2114   hydrALAZINE (APRESOLINE) injection 5 mg  5 mg Intravenous Q2H PRN Earnestine Leys, MD       HYDROcodone-acetaminophen (NORCO/VICODIN) 5-325 MG per tablet 1-2 tablet  1-2 tablet Oral Q4H PRN Earnestine Leys, MD   2 tablet at 08/18/21 0046   lactulose ([place]) 10 GM/15ML solution 20 g  20 g Oral Daily PRN Earnestine Leys, MD       lidocaine (LIDODERM) 5 % 1 patch  1 patch Transdermal Q24H Earnestine Leys, MD   1 patch at 08/17/21 1555   loratadine (CLARITIN) tablet 10 mg  10 mg Oral Daily Earnestine Leys, MD   10 mg at 08/18/21 1003   magnesium hydroxide (MILK OF MAGNESIA) suspension 30 mL  30 mL Oral Daily PRN Earnestine Leys, MD   30 mL at 08/18/21 0531   menthol-cetylpyridinium (CEPACOL) lozenge 3 mg  1 lozenge Oral PRN Earnestine Leys, MD       Or   phenol (CHLORASEPTIC) mouth spray 1 spray  1 spray Mouth/Throat PRN Earnestine Leys, MD       methocarbamol (ROBAXIN) tablet 500 mg  500 mg Oral Q8H PRN Earnestine Leys, MD   500 mg at 08/18/21 1057   metoCLOPramide (REGLAN) tablet 5-10 mg  5-10 mg Oral Q8H  PRN Earnestine Leys, MD       Or   metoCLOPramide (REGLAN) injection 5-10 mg  5-10 mg Intravenous Q8H PRN Earnestine Leys, MD       mometasone (ELOCON) 0.1 % cream 1 Application  1 Application Topical Q [id number] Earnestine Leys, MD       morphine (PF) 2 MG/ML injection 0.5-1 mg  0.5-1 mg Intravenous Q2H PRN Earnestine Leys, MD   1 mg at 08/16/21 2149  morphine (PF) 2 MG/ML injection 2 mg  2 mg Intravenous Q4H PRN Earnestine Leys, MD   2 mg at 08/17/21 0548   multivitamin with minerals tablet 1 tablet  1 tablet Oral Daily Earnestine Leys, MD   1 tablet at 08/18/21 1000   ondansetron (ZOFRAN) injection 4 mg  4 mg Intravenous Q8H PRN Earnestine Leys, MD       oxyCODONE-acetaminophen (PERCOCET/ROXICET) 5-325 MG per tablet 1 tablet  1 tablet Oral Q4H PRN Earnestine Leys, MD   1 tablet at 08/18/21 1057   polyethylene glycol (MIRALAX / GLYCOLAX) packet 17 g  17 g Oral BID Earnestine Leys, MD   17 g at 08/18/21 1000   polyvinyl alcohol (LIQUIFILM TEARS) 1.4 % ophthalmic solution 2 drop  2 drop Both Eyes Q6H PRN Earnestine Leys, MD       psyllium (HYDROCIL/METAMUCIL) 1 packet  1 packet Oral QHS Earnestine Leys, MD   1 packet at 08/17/21 2115   rivaroxaban (XARELTO) tablet 10 mg  10 mg Oral Q supper Fritzi Mandes, MD   10 mg at 08/17/21 6803   saccharomyces boulardii (FLORASTOR) capsule 250 mg  250 mg Oral Daily Earnestine Leys, MD   250 mg at 08/18/21 1000   senna (SENOKOT) tablet 8.6 mg  1 tablet Oral BID Earnestine Leys, MD   8.6 mg at 08/18/21 1003   senna-docusate (Senokot-S) tablet 1 tablet  1 tablet Oral QHS PRN Earnestine Leys, MD       sodium chloride (OCEAN) 0.65 % nasal spray 2 spray  2 spray Each Nare PRN Fritzi Mandes, MD       sodium phosphate (FLEET) 7-19 GM/118ML enema 1 enema  1 enema Rectal Once PRN Earnestine Leys, MD       spironolactone (ALDACTONE) tablet 50 mg  50 mg Oral Daily Earnestine Leys, MD   50 mg at 08/18/21 1001   tiZANidine (ZANAFLEX) tablet 2 mg  2 mg Oral TID Earnestine Leys, MD   2 mg at  08/18/21 1000   tranexamic acid (CYKLOKAPRON) IVPB 1,000 mg  1,000 mg Intravenous Once Earnestine Leys, MD       zolpidem Lorrin Mais) tablet 5 mg  5 mg Oral QHS PRN Earnestine Leys, MD         Discharge Medications: Please see discharge summary for a list of discharge medications.  Relevant Imaging Results:  Relevant Lab Results:   Additional Information SS# [id number]  Conception Oms, RN

## 2021-08-18 NOTE — Care Management Important Message (Signed)
Important Message  Patient Details  Name: Sherry [name] MRN: [id number] Date of Birth: 08-10-1934   Medicare Important Message Given:  N/A - LOS <3 / Initial given by admissions     Juliann Pulse A Joah Patlan 08/18/2021, 9:09 AM

## 2021-08-19 ENCOUNTER — Ambulatory Visit: Payer: Medicare HMO

## 2021-08-19 ENCOUNTER — Encounter: Payer: Self-pay | Admitting: Specialist

## 2021-08-19 ENCOUNTER — Ambulatory Visit: Payer: Medicare HMO | Admitting: Occupational Therapy

## 2021-08-19 DIAGNOSIS — S72009A Fracture of unspecified part of neck of unspecified femur, initial encounter for closed fracture: Secondary | ICD-10-CM | POA: Diagnosis not present

## 2021-08-19 LAB — BPAM RBC
Blood Product Expiration Date: 202308012359
Blood Product Expiration Date: 202308042359
Blood Product Expiration Date: 202308052359
ISSUE DATE / TIME: 202307161114
ISSUE DATE / TIME: 202307161114
ISSUE DATE / TIME: 202307181406
Unit Type and Rh: 6200
Unit Type and Rh: 6200
Unit Type and Rh: 6200

## 2021-08-19 LAB — TYPE AND SCREEN
ABO/RH(D): AB POS
Antibody Screen: NEGATIVE
Unit division: 0
Unit division: 0
Unit division: 0

## 2021-08-19 LAB — BASIC METABOLIC PANEL
Anion gap: 5 (ref 5–15)
BUN: 23 mg/dL (ref 8–23)
CO2: 23 mmol/L (ref 22–32)
Calcium: 7.4 mg/dL — ABNORMAL LOW (ref 8.9–10.3)
Chloride: 102 mmol/L (ref 98–111)
Creatinine, Ser: 0.72 mg/dL (ref 0.44–1.00)
GFR, Estimated: >60 mL/min (ref >60)
Glucose, Bld: 117 mg/dL — ABNORMAL HIGH (ref 70–99)
Potassium: 5.1 mmol/L (ref 3.5–5.1)
Sodium: 130 mmol/L — ABNORMAL LOW (ref 135–145)

## 2021-08-19 LAB — CBC
HCT: 27.5 % — ABNORMAL LOW (ref 36.0–46.0)
Hemoglobin: 8.9 g/dL — ABNORMAL LOW (ref 12.0–15.0)
MCH: 28.6 pg (ref 26.0–34.0)
MCHC: 32.4 g/dL (ref 30.0–36.0)
MCV: 88.4 fL (ref 80.0–100.0)
Platelets: 198 10*3/uL (ref 150–400)
RBC: 3.11 MIL/uL — ABNORMAL LOW (ref 3.87–5.11)
RDW: 16.2 % — ABNORMAL HIGH (ref 11.5–15.5)
WBC: 10.6 10*3/uL — ABNORMAL HIGH (ref 4.0–10.5)
nRBC: 0 % (ref 0.0–0.2)

## 2021-08-19 NOTE — TOC Initial Note (Addendum)
Transition of Care [facility]) - Initial/Assessment Note    Patient Details  Name: Sherry [name] MRN: [id number] Date of Birth: 04/30/1934  Transition of Care [facility]) CM/SW Contact:    Candie Chroman, LCSW Phone Number: 08/19/2021, 10:20 AM  Clinical Narrative:   CSW met with patient. Daughter at bedside. CSW introduced role and explained that therapy recommendations would be discussed. Patient is a long-term resident at [facility]. She and daughter are aware of plan to return with rehab. Will start insurance authorization once anticipated discharge date determined. She will need EMS transport to return. No further concerns. CSW encouraged patient and her daughter to contact CSW as needed. CSW will continue to follow patient and her daughter for support and facilitate return to [facility] once medically stable.               1:45 pm: CMA will start insurance authorization.   Expected Discharge Plan: Skilled Nursing Facility Barriers to Discharge: Continued Medical Work up   Patient Goals and CMS Choice     Choice offered to / list presented to : Patient, Adult Children  Expected Discharge Plan and Services Expected Discharge Plan: [place] Acute Care Choice: [place] Living arrangements for the past 2 months: [place]                                      Prior Living Arrangements/Services Living arrangements for the past 2 months: [place] Lives with:: Facility Resident Patient language and need for interpreter reviewed:: Yes Do you feel safe going back to the place where you live?: Yes      Need for Family Participation in Patient Care: Yes (Comment) Care giver support system in place?: Yes (comment)   Criminal Activity/Legal Involvement Pertinent to Current Situation/Hospitalization: No - Comment as needed  Activities of Daily Living   ADL Screening (condition at time of  admission) Patient's cognitive ability adequate to safely complete daily activities?: Yes Is the patient deaf or have difficulty hearing?: No Does the patient have difficulty seeing, even when wearing glasses/contacts?: No Does the patient have difficulty concentrating, remembering, or making decisions?: No Patient able to express need for assistance with ADLs?: Yes Does the patient have difficulty dressing or bathing?: Yes Independently performs ADLs?: No Communication: Independent Dressing (OT): Needs assistance Is this a change from baseline?: Pre-admission baseline Grooming: Needs assistance Is this a change from baseline?: Pre-admission baseline Feeding: Needs assistance Is this a change from baseline?: Pre-admission baseline Bathing: Needs assistance Is this a change from baseline?: Pre-admission baseline Toileting: Needs assistance Is this a change from baseline?: Pre-admission baseline In/Out Bed: Dependent Is this a change from baseline?: Pre-admission baseline Does the patient have difficulty walking or climbing stairs?: Yes Weakness of Legs: Both Weakness of Arms/Hands: Both  Permission Sought/Granted Permission sought to share information with : Facility Sport and exercise psychologist, Family Supports Permission granted to share information with : Yes, Verbal Permission Granted  Share Information with NAME: [name]  Permission granted to share info w AGENCY: [facility]  Permission granted to share info w Relationship: Daughter/HCPOA  Permission granted to share info w Contact Information: [phone]  Emotional Assessment Appearance:: Appears stated age Attitude/Demeanor/Rapport: Engaged Affect (typically observed): Accepting, Appropriate, Calm, Pleasant Orientation: : Oriented to Self, Oriented to Place, Oriented to  Time, Oriented to Situation Alcohol / Substance  Use: Not Applicable Psych Involvement: No (comment)  Admission diagnosis:  Closed hip fracture  ([place]) [S72.009A] Closed 2-part intertrochanteric fracture of left femur, initial encounter ([place]) [S72.142A] Patient Active Problem List   Diagnosis Date Noted   Closed hip fracture ([place]) 08/15/2021   DVT (deep venous thrombosis) ([place]) 08/15/2021   Quadriplegia ([place]) 08/15/2021   Fall 08/15/2021   Acute appendicitis with appendiceal abscess 05/30/2018   Hypocalcemia 02/15/2018   Dysuria 02/15/2018   Bilateral lower extremity edema 02/11/2018   Vaginal yeast infection 01/30/2018   Chest tightness 01/27/2018   Healthcare-associated pneumonia 01/25/2018   Pleural effusion, not elsewhere classified 01/25/2018   Acute deep vein thrombosis (DVT) of left lower extremity ([place]) 01/24/2018   Acute deep vein thrombosis (DVT) of right lower extremity ([place]) 01/24/2018   Chronic allergic rhinitis 01/24/2018   Depression with anxiety 01/24/2018   UTI due to Klebsiella species 01/24/2018   Protein-calorie malnutrition, severe ([place]) 01/24/2018   S/P insertion of IVC (inferior vena caval) filter 01/24/2018   Reactive depression    Hypokalemia    Leukocytosis    Essential hypertension    Trauma    Tetraparesis (HCC)    Neuropathic pain    Neurogenic bowel    Neurogenic bladder    Benign essential HTN    Acute blood loss anemia    Central cord syndrome at C6 level of cervical spinal cord ([place]) 11/29/2017   Central cord syndrome ([place]) 11/29/2017   PCP:  Venia Carbon, MD Pharmacy:   [facility], [place] [address] Phone: [phone] Fax: [phone]     Social Determinants of Health ([place]) Interventions    Readmission Risk Interventions     No data to display

## 2021-08-19 NOTE — Progress Notes (Addendum)
Sherry [name] NAME: Sherry [name]    MR#:  [id number]  DATE OF BIRTH:  25-Jun-1934  SUBJECTIVE:   Feeling fine. Tolerating diet. Up with pt   VITALS:  Blood pressure (!) 90/48, pulse 82, temperature 98.5 F (36.9 C), temperature source Oral, resp. rate 17, height '5\' 7"'$  (1.702 m), weight 81.6 kg, SpO2 95 %.  PHYSICAL EXAMINATION:   GENERAL:  87 y.o.-year-old patient lying in the bed with no acute distress.  LUNGS: Normal breath sounds bilaterally, no wheezing, rales, rhonchi.  CARDIOVASCULAR: S1, S2 normal. No murmurs, rubs, or gallops.  ABDOMEN: Soft, nontender, nondistended. Bowel sounds present. Chronic Foley + EXTREMITIES:left hip dressing + contractures and upper extremity due to functional quadriplegia NEUROLOGIC: nonfocal  patient is alert   LABORATORY PANEL:  CBC Recent Labs  Lab 08/19/21 0333  WBC 10.6*  HGB 8.9*  HCT 27.5*  PLT 198    Chemistries  Recent Labs  Lab 08/19/21 0333  NA 130*  K 5.1  CL 102  CO2 23  GLUCOSE 117*  BUN 23  CREATININE 0.72  CALCIUM 7.4*   Cardiac Enzymes No results for input(s): "TROPONINI" in the last 168 hours. RADIOLOGY:  No results found.  Assessment and Plan  Sherry [name] is a 87 y.o. 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  Patient has history of quadriplegia, and has been doing PT/OT with improvement of function, able to stand at baseline and assist with some transfers -X-ray of hip/pelvis: 1. Bilateral hip fractures. 2. Comminuted intertrochanteric fracture of the right hip. 3. Angulated fracture of the proximal left femur just below the intertrochanteric region.  Closed left hip fracture (HCC) s/p mechanical fall Closed right  hip fracture (?old) Patient has a bilateral hip fracture as evidenced by x-ray. Patient has moderate to sever pain now. No neurovascular compromise. Orthopedic surgeon, Dr. Sabra Heck was consulted.  --pt is s/p  INTRAMEDULLARY (IM) NAIL FEMORAL-left  - Pain control: morphine prn and percocet - When necessary Zofran for nausea - Robaxin for muscle spasm -- PT/OT, needs snf, has bed, currently awaiting insurance authorization --s/p 2 unit BT during surgery --hgb 12.0--2 units BT--9.4--7.7--3rd PRBC 7/18, hgt stable this morning --ok to start Xarelto per Dr Sabra Heck   DVT (deep venous thrombosis) [facility]) -- Patient is allergic to pork-developed Pradaxa, cannot use IV heparin.  --Lower extremity venous Doppler was done which showed no evidence of acute DVT in major deep veins in both lower extremities. Pt is s/p of IVC filter. --now back on xarelto   Essential hypertension Here bp low normal, home meds on hold   Depression with anxiety - Continue  Cymbalta   H/o Quadriplegia ([place]) - Fall precaution -Continue muscle relaxer,  -Continue Neurontin      Procedures: s/p INTRAMEDULLARY (IM) NAIL FEMORAL-left --08/16/2021 Family communication :spoke with dter at bedside Consults :ortho CODE STATUS: full DVT Prophylaxis :Xarelto Level of care: Med-Surg Status is: Inpatient Remains inpatient appropriate because: unsafe d/c plan TOC for d/c planning   Desma Maxim M.D    [facility]   CC: Primary care physician; Venia Carbon, MD

## 2021-08-19 NOTE — Progress Notes (Signed)
Occupational Therapy Treatment Patient Details Name: Sherry [name] MRN: [id number] DOB: 1934-10-24 Today's Date: 08/19/2021   History of present illness Pt is an 87 y/o F admitted on 08/15/21 after presenting with a fall & L hip pain. X-rays revealed B hip fxs. Pt underwent sx for L IM nail on 08/16/21 & is now PWB LLE. PMH: quadraplegia 2/2 central cord syndrome C6 2/2 neck fx, HTN, depression, anxiety, neurogenic bladder, Bilateral DVT   OT comments  [name] was seen for OT treatment on this date. Upon arrival to room pt reclined in bed, agreeable to tx. Vitals taken in supine: BP 90/48, MAP 61, HR 68, deferred EOB 2/2 low BP. Educated pt/family on bed in chair position. Pt completed bed level therex - 1 set x 15 reps each glute squeezes, SAQ, and knee extension, requires assist to complete. Pt reports having need for BM, NT into assist. MAX A x2 bed level toileting and pericare. UNable to tolerate seated tasks 2/2 low BP in supine. Will continue to follow POC. Discharge recommendation remains appropriate, if sitting tolerance improves pt may resume outpatient OT however currently unable to tolerate sitting (BP and pain limiting).     Recommendations for follow up therapy are one component of a multi-disciplinary discharge planning process, led by the attending physician.  Recommendations may be updated based on patient status, additional functional criteria and insurance authorization.    Follow Up Recommendations  Skilled nursing-short term rehab (<3 hours/day)    Assistance Recommended at Discharge Frequent or constant Supervision/Assistance  Patient can return home with the following  Two people to help with walking and/or transfers;Two people to help with bathing/dressing/bathroom;Assistance with cooking/housework;Assist for transportation   Equipment Recommendations  None recommended by OT    Recommendations for Other Services      Precautions / Restrictions  Precautions Precautions: Fall Precaution Comments: foley catheter Required Braces or Orthoses: Other Brace Other Brace: B PRAFO Restrictions Weight Bearing Restrictions: Yes RLE Weight Bearing: Partial weight bearing LLE Weight Bearing: Partial weight bearing Other Position/Activity Restrictions: per secure chat with MD "minimal weight on either leg" for 8-10 weeks       Mobility Bed Mobility Overal bed mobility: Needs Assistance Bed Mobility: Rolling Rolling: Max assist, +2 for physical assistance         General bed mobility comments: pt assists with BUE on bed rails    Transfers                             ADL either performed or assessed with clinical judgement   ADL Overall ADL's : Needs assistance/impaired                                       General ADL Comments: MAX A x2 bed level toileting and pericare. UNable to tolerate seated tasks 2/2 low BP in supine      Cognition Arousal/Alertness: Awake/alert Behavior During Therapy: WFL for tasks assessed/performed Overall Cognitive Status: Within Functional Limits for tasks assessed                                                General Comments supine: BP 90/48, MAP 61, HR 68    Pertinent Vitals/ Pain  Pain Assessment Pain Assessment: Faces Faces Pain Scale: Hurts little more Pain Location: LLE Pain Descriptors / Indicators: Discomfort, Dull Pain Intervention(s): Limited activity within patient's tolerance, Repositioned         Frequency  Min 2X/week        Progress Toward Goals  OT Goals(current goals can now be found in the care plan section)  Progress towards OT goals: Progressing toward goals  Acute Rehab OT Goals Patient Stated Goal: to go to outpatient OT OT Goal Formulation: With patient Time For Goal Achievement: 08/31/21 Potential to Achieve Goals: Fair ADL Goals Pt Will Perform Grooming: sitting;with min assist;with caregiver  independent in assisting Pt Will Perform Upper Body Dressing: with caregiver independent in assisting;with max assist;sitting Pt Will Transfer to Toilet: stand pivot transfer;regular height toilet;with max assist;with +2 assist  Plan Discharge plan remains appropriate;Frequency remains appropriate    Co-evaluation                 AM-PAC OT "6 Clicks" Daily Activity     Outcome Measure   Help from another person eating meals?: A Little Help from another person taking care of personal grooming?: A Little Help from another person toileting, which includes using toliet, bedpan, or urinal?: Total Help from another person bathing (including washing, rinsing, drying)?: A Lot Help from another person to put on and taking off regular upper body clothing?: A Lot Help from another person to put on and taking off regular lower body clothing?: Total 6 Click Score: 12    End of Session    OT Visit Diagnosis: Muscle weakness (generalized) (M62.81)   Activity Tolerance Patient tolerated treatment well   Patient Left in bed;with call bell/phone within reach;with family/visitor present;with nursing/sitter in room   Nurse Communication          Time: 7903-8333 OT Time Calculation (min): 27 min  Charges: OT General Charges $OT Visit: 1 Visit OT Treatments $Self Care/Home Management : 8-22 mins $Therapeutic Exercise: 8-22 mins  Dessie Coma, M.S. OTR/L  08/19/21, 1:55 PM  ascom [phone]

## 2021-08-19 NOTE — Plan of Care (Signed)
  Problem: Education: Goal: Knowledge of General Education information will improve Description: Including pain rating scale, medication(s)/side effects and non-pharmacologic comfort measures Outcome: Progressing   Problem: Health Behavior/Discharge Planning: Goal: Ability to manage health-related needs will improve Outcome: Progressing   Problem: Clinical Measurements: Goal: Ability to maintain clinical measurements within normal limits will improve Outcome: Progressing Goal: Will remain free from infection Outcome: Progressing Goal: Diagnostic test results will improve Outcome: Progressing Goal: Respiratory complications will improve Outcome: Progressing Goal: Cardiovascular complication will be avoided Outcome: Progressing   Problem: Nutrition: Goal: Adequate nutrition will be maintained Outcome: Progressing   Problem: Coping: Goal: Level of anxiety will decrease Outcome: Progressing   Problem: Elimination: Goal: Will not experience complications related to bowel motility Outcome: Progressing   Problem: Pain Managment: Goal: General experience of comfort will improve Outcome: Progressing   Problem: Safety: Goal: Ability to remain free from injury will improve Outcome: Progressing   Problem: Skin Integrity: Goal: Risk for impaired skin integrity will decrease Outcome: Progressing

## 2021-08-20 DIAGNOSIS — S72009A Fracture of unspecified part of neck of unspecified femur, initial encounter for closed fracture: Secondary | ICD-10-CM | POA: Diagnosis not present

## 2021-08-20 LAB — CBC
HCT: 23.7 % — ABNORMAL LOW (ref 36.0–46.0)
Hemoglobin: 7.8 g/dL — ABNORMAL LOW (ref 12.0–15.0)
MCH: 28.7 pg (ref 26.0–34.0)
MCHC: 32.9 g/dL (ref 30.0–36.0)
MCV: 87.1 fL (ref 80.0–100.0)
Platelets: 216 10*3/uL (ref 150–400)
RBC: 2.72 MIL/uL — ABNORMAL LOW (ref 3.87–5.11)
RDW: 16.4 % — ABNORMAL HIGH (ref 11.5–15.5)
WBC: 8.9 10*3/uL (ref 4.0–10.5)
nRBC: 0 % (ref 0.0–0.2)

## 2021-08-20 LAB — BASIC METABOLIC PANEL
Anion gap: 3 — ABNORMAL LOW (ref 5–15)
BUN: 15 mg/dL (ref 8–23)
CO2: 26 mmol/L (ref 22–32)
Calcium: 7.1 mg/dL — ABNORMAL LOW (ref 8.9–10.3)
Chloride: 101 mmol/L (ref 98–111)
Creatinine, Ser: 0.54 mg/dL (ref 0.44–1.00)
GFR, Estimated: >60 mL/min (ref >60)
Glucose, Bld: 101 mg/dL — ABNORMAL HIGH (ref 70–99)
Potassium: 4.5 mmol/L (ref 3.5–5.1)
Sodium: 130 mmol/L — ABNORMAL LOW (ref 135–145)

## 2021-08-20 LAB — HEMOGLOBIN: Hemoglobin: 8.6 g/dL — ABNORMAL LOW (ref 12.0–15.0)

## 2021-08-20 MED ORDER — OXYCODONE-ACETAMINOPHEN 5-325 MG PO TABS: 1.0000 | ORAL_TABLET | ORAL | 0 refills | Status: DC | PRN

## 2021-08-20 MED ORDER — FERROUS SULFATE 325 (65 FE) MG PO TABS: 325.0000 mg | ORAL_TABLET | Freq: Every day | ORAL | 3 refills | Status: DC

## 2021-08-20 NOTE — Progress Notes (Signed)
Pt to be d/c to [facility], Report given to [employer], awaiting EMS transportation.

## 2021-08-20 NOTE — Discharge Summary (Signed)
Sherry [name] QXI:[id number] DOB: 05-31-1934 DOA: 08/15/2021  PCP: Venia Carbon, MD  Admit date: 08/15/2021 Discharge date: 08/20/2021  Time spent: 35 minutes  Recommendations for Outpatient Follow-up:  Orthopedics f/u 2 weeks    Discharge Diagnoses:  Principal Problem:   Closed hip fracture [facility]) Active Problems:   Fall   DVT (deep venous thrombosis) (HCC)   Essential hypertension   Depression with anxiety   Quadriplegia ([place])   Central cord syndrome at C6 level of cervical spinal cord (HCC)   Neurogenic bowel   Neurogenic bladder   Acute postoperative anemia due to expected blood loss   Discharge Condition: stable  Diet recommendation: heart healthy  Filed Weights   08/15/21 1032  Weight: 81.6 kg    History of present illness:  From admission h and p Sherry [name] is a 87 y.o. 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   Patient has history of quadriplegia, and has been doing PT/OT with improvement of function. Pt has some use of both arms and can feed herself. She can walk a little bit with assistance. Pt accidentally fell at about 930 this morning after taking shower.  She injured her hips.  She developed pain in the left hip, which is constant, sharp, severe, nonradiating.  Patient does not have much pain in the right hip. Denies loss of consciousness.  Strongly denies any head or neck injury.  She refused CT of head and neck.  Patient does not have nausea, vomiting, diarrhea or abdominal pain.  No symptoms of UTI.  No history of CAD or CHF.    Hospital Course:    Closed left hip fracture (HCC) s/p mechanical fall Closed right hip fracture (?old) Acute blood loss anemia --pt is s/p  INTRAMEDULLARY (IM) NAIL FEMORAL-left on 7/16 -  Pain control: percocet -- skilled nursing -- s/p 3 units blood transfusion, hgb now stable    Hx DVT (deep venous thrombosis) ([place]) -- Patient is allergic to pork-developed Pradaxa, cannot use IV heparin.  --Lower extremity venous Doppler was done which showed no evidence of acute DVT in major deep veins in both lower extremities. Pt is s/p of IVC filter. --now back on xarelto   Essential hypertension Here bp low normal, home spironolactone on hold     Procedures: See above   Consultations: orthopedics  Discharge Exam: Vitals:   08/20/21 0313 08/20/21 0816  BP: (!) 98/55 (!) 111/58  Pulse: 78 81  Resp: 18 16  Temp: 98.4 F (36.9 C) 98.3 F (36.8 C)  SpO2: 95% 100%    GENERAL:  87 y.o.-year-old patient lying in the bed with no acute distress.  LUNGS: Normal breath sounds bilaterally, no wheezing, rales, rhonchi.  CARDIOVASCULAR: S1, S2 normal. No murmurs, rubs, or gallops.  ABDOMEN: Soft, nontender, nondistended. Bowel sounds present. Chronic Foley + EXTREMITIES:left hip dressing + contractures and upper extremity due to functional quadriplegia NEUROLOGIC: nonfocal  patient is alert   Discharge Instructions   Discharge Instructions     Diet - low sodium heart healthy   Complete by: As directed    Increase activity slowly   Complete by: As directed    Leave dressing on - Keep it clean, dry, and intact until clinic visit   Complete by: As directed  Allergies as of 08/20/2021       Reactions   Other Swelling   Lisinopril-hydrochlorothiazide Swelling   Swelling of the tongue   Beef-derived Products Swelling   Facial and lip swelling (due to tick bite)   Dairy Aid [tilactase] Swelling   Lisinopril-hydrochlorothiazide Swelling   Tongue swelling   Pork-derived Products Swelling        Medication List     STOP taking these medications    spironolactone 50 MG tablet Commonly known as: ALDACTONE       TAKE these medications    acetaminophen  500 MG tablet Commonly known as: TYLENOL Take 500 mg by mouth every 12 (twelve) hours as needed for mild pain. What changed: Another medication with the same name was removed. Continue taking this medication, and follow the directions you see here.   acetic acid 0.25 % irrigation Irrigate with 1 Application as directed daily at 6 (six) AM.   alendronate 70 MG tablet Commonly known as: FOSAMAX Take 70 mg by mouth every Saturday.   alum & mag hydroxide-simeth 200-200-20 MG/5ML suspension Commonly known as: MAALOX/MYLANTA Take 30 mLs by mouth every 4 (four) hours as needed for indigestion.   Beano Tabs Take 1 tablet by mouth 3 (three) times daily.   calcium carbonate 500 MG chewable tablet Commonly known as: TUMS - dosed in mg elemental calcium Chew 2 tablets by mouth every 8 (eight) hours as needed for heartburn.   Capsaicin 0.1 % Crea Apply 1 application  topically every evening. (Apply to hands)   carboxymethylcellulose 0.5 % Soln Commonly known as: REFRESH PLUS Place 2 drops into both eyes every 6 (six) hours as needed (dry eyes).   cholecalciferol 25 MCG (1000 UNIT) tablet Commonly known as: VITAMIN D3 Take 1,000 Units by mouth daily.   diphenhydrAMINE 25 mg capsule Commonly known as: BENADRYL Take 25-50 mg by mouth every 6 (six) hours as needed for itching.   DULoxetine 60 MG capsule Commonly known as: CYMBALTA Take 60 mg by mouth daily.   ENDIT EX Apply 1 Application topically See admin instructions. Apply to gluteal fold/peri area once per shift   EPINEPHrine 0.3 mg/0.3 mL Soaj injection Commonly known as: EPI-PEN Inject 0.3 mg into the muscle as needed for anaphylaxis.   ferrous sulfate 325 (65 FE) MG tablet Take 1 tablet (325 mg total) by mouth daily with breakfast. Start taking on: August 21, 2021   gabapentin 300 MG capsule Commonly known as: NEURONTIN Take 300 mg by mouth in the morning.   gabapentin 600 MG tablet Commonly known as: NEURONTIN Take  600 mg by mouth at bedtime.   guaiFENesin 600 MG 12 hr tablet Commonly known as: MUCINEX Take 600 mg by mouth 2 (two) times daily as needed for to loosen phlegm.   lactulose (encephalopathy) 10 GM/15ML Soln Commonly known as: CHRONULAC Take 20 g by mouth daily as needed (mild constipation).   loratadine 10 MG tablet Commonly known as: CLARITIN Take 10 mg by mouth daily.   magnesium citrate solution Take 60-120 mLs by mouth daily as needed (constipation).   methylcellulose oral powder Take 1 packet by mouth at bedtime.   mometasone 0.1 % lotion Commonly known as: ELOCON Apply 1 Application topically every Saturday at 6 PM. (Apply to the ear canal for eczema)   multivitamin with minerals Tabs tablet Take 1 tablet by mouth daily.   oxyCODONE-acetaminophen 5-325 MG tablet Commonly known as: PERCOCET/ROXICET Take 1 tablet by mouth every 4 (four) hours as needed  for moderate pain.   polyethylene glycol 17 g packet Commonly known as: MIRALAX / GLYCOLAX Take 17 g by mouth 2 (two) times daily.   rivaroxaban 20 MG Tabs tablet Commonly known as: XARELTO Take 1 tablet (20 mg total) by mouth daily with supper. What changed:  how much to take when to take this   saccharomyces boulardii 250 MG capsule Commonly known as: FLORASTOR Take 250 mg by mouth daily.   Saline Spray 0.2 % Soln Place 2 sprays into both nostrils as needed (nasal congestion).   sodium phosphate 7-19 GM/118ML Enem Place 1 enema rectally every other day as needed for severe constipation.   tiZANidine 2 MG tablet Commonly known as: ZANAFLEX Take 2 mg by mouth 3 (three) times daily.   triamcinolone cream 0.1 % Commonly known as: KENALOG Apply 1 Application topically every 8 (eight) hours as needed (rashy/itchy skin). (Apply to thighs, lower legs and upper arms)               Discharge Care Instructions  (From admission, onward)           Start     Ordered   08/20/21 0000  Leave dressing on  - Keep it clean, dry, and intact until clinic visit        08/20/21 1338           Allergies  Allergen Reactions   Other Swelling   Lisinopril-Hydrochlorothiazide Swelling    Swelling of the tongue   Beef-Derived Products Swelling    Facial and lip swelling (due to tick bite)   Dairy Aid [Tilactase] Swelling   Lisinopril-Hydrochlorothiazide Swelling    Tongue swelling   Pork-Derived Products Swelling    Contact information for follow-up providers     Earnestine Leys, MD Follow up in 2 week(s).   Specialty: Orthopedic Surgery Why: Please make appointment for patient in my office prior to discharge. Contact information: [place] [address] [phone]              Contact information for after-discharge care     Destination     [facility] .   Service: Skilled Nursing Contact information: [place] [phone]                      The results of significant diagnostics from this hospitalization (including imaging, microbiology, ancillary and laboratory) are listed below for reference.    Significant Diagnostic Studies: DG HIP UNILAT WITH PELVIS 2-3 VIEWS LEFT  Result Date: 08/16/2021 CLINICAL DATA:  Left hip fracture repair Images: 15 Cumulative air kerma: 15.471 mGy EXAM: DG HIP (WITH OR WITHOUT PELVIS) 2-3V LEFT COMPARISON:  August 15, 2021 FINDINGS: Fifteen images were obtained during left hip fracture repair. By the end of the study, a gamma nail and femoral rod have been placed with a proximal cerclage wire. A distal interlocking screw is noted. IMPRESSION: Left hip fracture repair as above. Electronically Signed   By: Dorise Bullion III M.D.   On: 08/16/2021 14:14   DG C-Arm 1-60 Min-No Report  Result Date: 08/16/2021 Fluoroscopy was utilized by the requesting physician.  No radiographic interpretation.   DG C-Arm 1-60 Min-No Report  Result Date:  08/16/2021 Fluoroscopy was utilized by the requesting physician.  No radiographic interpretation.   US Venous Img Lower Bilateral (DVT)  Result Date: 08/15/2021 CLINICAL DATA:  Pain, swelling, prior history of DVT EXAM: BILATERAL LOWER EXTREMITY VENOUS  DOPPLER ULTRASOUND TECHNIQUE: Gray-scale sonography with graded compression, as well as color Doppler and duplex ultrasound were performed to evaluate the lower extremity deep venous systems from the level of the common femoral vein and including the common femoral, femoral, profunda femoral, popliteal and calf veins including the posterior tibial, peroneal and gastrocnemius veins when visible. The superficial great saphenous vein was also interrogated. Spectral Doppler was utilized to evaluate flow at rest and with distal augmentation maneuvers in the common femoral, femoral and popliteal veins. COMPARISON:  None Available. FINDINGS: RIGHT LOWER EXTREMITY Common Femoral Vein: No evidence of thrombus. Normal compressibility, respiratory phasicity and response to augmentation. Saphenofemoral Junction: No evidence of thrombus. Normal compressibility and flow on color Doppler imaging. Profunda Femoral Vein: No evidence of thrombus. Normal compressibility and flow on color Doppler imaging. Femoral Vein: No evidence of thrombus. Normal compressibility, respiratory phasicity and response to augmentation. Popliteal Vein: No evidence of thrombus. Normal compressibility, respiratory phasicity and response to augmentation. Calf Veins: Not optimally visualized for evaluation. Superficial Great Saphenous Vein: No evidence of thrombus. Normal compressibility. Venous Reflux:  None. Other Findings:  None. LEFT LOWER EXTREMITY Common Femoral Vein: No evidence of thrombus. Normal compressibility, respiratory phasicity and response to augmentation. Saphenofemoral Junction: No evidence of thrombus. Normal compressibility and flow on color Doppler imaging. Profunda Femoral Vein: No  evidence of thrombus. Normal compressibility and flow on color Doppler imaging. Femoral Vein: No evidence of thrombus. Normal compressibility, respiratory phasicity and response to augmentation. Popliteal Vein: Distal left popliteal vein is not optimally visualized. Calf Veins: Not adequately visualized for evaluation. Superficial Great Saphenous Vein: No evidence of thrombus. Normal compressibility. Venous Reflux:  None. Other Findings: Examination was technically difficult due to patient's clinical condition with bilateral hip fractures. IMPRESSION: There is no evidence of acute DVT in major deep veins in both lower extremities. Deep veins in both calves and the caudal course of left popliteal vein are not optimally visualized due to patient's clinical condition due to fractures in both proximal femurs. If there are continued symptoms, short-term follow-up study should be considered. Electronically Signed   By: Elmer Picker M.D.   On: 08/15/2021 18:32   DG Hip Unilat With Pelvis 2-3 Views Left  Result Date: 08/15/2021 CLINICAL DATA:  Fall.  Acute left hip pain. EXAM: DG HIP (WITH OR WITHOUT PELVIS) 2-3V LEFT; DG HIP (WITH OR WITHOUT PELVIS) 2-3V RIGHT COMPARISON:  CT of the abdomen and pelvis 01/21/2020 FINDINGS: Left proximal femur fracture noted just below the intertrochanteric region. Marked ventral angulation noted on the cross-table image. Slight varus angulation present Comminuted intratrochanteric fracture present on the right also with marked ventral angulation. IMPRESSION: 1. Bilateral hip fractures. 2. Comminuted intertrochanteric fracture of the right hip. 3. Angulated fracture of the proximal left femur just below the intertrochanteric region. Electronically Signed   By: San Morelle M.D.   On: 08/15/2021 11:54   DG HIP UNILAT WITH PELVIS 2-3 VIEWS RIGHT  Result Date: 08/15/2021 CLINICAL DATA:  Fall.  Acute left hip pain. EXAM: DG HIP (WITH OR WITHOUT PELVIS) 2-3V LEFT; DG HIP  (WITH OR WITHOUT PELVIS) 2-3V RIGHT COMPARISON:  CT of the abdomen and pelvis 01/21/2020 FINDINGS: Left proximal femur fracture noted just below the intertrochanteric region. Marked ventral angulation noted on the cross-table image. Slight varus angulation present Comminuted intratrochanteric fracture present on the right also with marked ventral angulation. IMPRESSION: 1. Bilateral hip fractures. 2. Comminuted intertrochanteric fracture of the right hip. 3. Angulated fracture of the proximal left femur just  below the intertrochanteric region. Electronically Signed   By: San Morelle M.D.   On: 08/15/2021 11:54   DG Chest 1 View  Result Date: 08/15/2021 CLINICAL DATA:  87 year old female history of trauma from a fall. EXAM: CHEST  1 VIEW COMPARISON:  None Available. FINDINGS: Lung volumes are normal. No consolidative airspace disease. No pleural effusions. No pneumothorax. No pulmonary nodule or mass noted. Pulmonary vasculature and the cardiomediastinal silhouette are within normal limits. Orthopedic fixation hardware in the lower cervical spine incidentally noted. IMPRESSION: 1.  No radiographic evidence of acute cardiopulmonary disease. Electronically Signed   By: Vinnie Langton M.D.   On: 08/15/2021 11:48    Microbiology: No results found for this or any previous visit (from the past 240 hour(s)).   Labs: Basic Metabolic Panel: Recent Labs  Lab 08/15/21 1035 08/17/21 0443 08/18/21 0423 08/19/21 0333 08/20/21 0443  NA 136 131* 124* 130* 130*  K 4.3 5.0 4.6 5.1 4.5  CL 102 100 95* 102 101  CO2 '28 23 23 23 26  '$ GLUCOSE 108* 155* 117* 117* 101*  BUN 9 24* 29* 23 15  CREATININE 0.59 1.07* 0.96 0.72 0.54  CALCIUM 8.9 7.3* 7.1* 7.4* 7.1*   Liver Function Tests: No results for input(s): "AST", "ALT", "ALKPHOS", "BILITOT", "PROT", "ALBUMIN" in the last 168 hours. No results for input(s): "LIPASE", "AMYLASE" in the last 168 hours. No results for input(s): "AMMONIA" in the last 168  hours. CBC: Recent Labs  Lab 08/15/21 1035 08/16/21 1333 08/17/21 0443 08/18/21 0423 08/18/21 1759 08/19/21 0333 08/20/21 0443 08/20/21 1029  WBC 7.4 16.9* 14.9* 13.0*  --  10.6* 8.9  --   NEUTROABS 5.2  --   --   --   --   --   --   --   HGB 12.9 10.1* 9.5* 7.7* 9.1* 8.9* 7.8* 8.6*  HCT 41.3 31.9* 28.6* 22.7* 27.4* 27.5* 23.7*  --   MCV 89.8 90.9 86.9 87.6  --  88.4 87.1  --   PLT 335 206 214 180  --  198 216  --    Cardiac Enzymes: No results for input(s): "CKTOTAL", "CKMB", "CKMBINDEX", "TROPONINI" in the last 168 hours. BNP: BNP (last 3 results) No results for input(s): "BNP" in the last 8760 hours.  ProBNP (last 3 results) No results for input(s): "PROBNP" in the last 8760 hours.  CBG: No results for input(s): "GLUCAP" in the last 168 hours.     Signed:  Desma Maxim MD.  [facility] 08/20/2021, 1:42 PM

## 2021-08-20 NOTE — Progress Notes (Signed)
Physical Therapy Treatment Patient Details Name: Sherry [name] MRN: [id number] DOB: 1934-09-01 Today's Date: 08/20/2021   History of Present Illness Pt is an 87 y/o F admitted on 08/15/21 after presenting with a fall & L hip pain. X-rays revealed B hip fxs. Pt underwent sx for L IM nail on 08/16/21 & is now PWB LLE. PMH: quadraplegia 2/2 central cord syndrome C6 2/2 neck fx, HTN, depression, anxiety, neurogenic bladder, Bilateral DVT   PT Comments    Pt agreeable to PT treatment session this date, and was received sitting in chair. Pt completed therapeutic exercises including APs, glute sets, quad sets, attempted heel slides and SLRs with pt unable to actively complete therefore PT performed PROM. Pt completed seated LAQs with limited ROM L LE and PROM performed for R LE due to pt inability to complete. Pt attempted seated marching with muscle activation present, but unable to lift LEs against gravity. Pt was transferred to EOB via hoyer lift where pt completed sitting balance for ~3 minutes with bilat UE support EOB and ability to remove UE support for ~8 seconds during session (back and feet unsupported). Pt required Max Ax2 for sit to supine transfer and rolling L/R while total A for pericare due to pt having BM when seated in chair. Continue to recommend STR at discharge.   Recommendations for follow up therapy are one component of a multi-disciplinary discharge planning process, led by the attending physician.  Recommendations may be updated based on patient status, additional functional criteria and insurance authorization.  Follow Up Recommendations  Skilled nursing-short term rehab (<3 hours/day) Can patient physically be transported by private vehicle: No   Assistance Recommended at Discharge Frequent or constant Supervision/Assistance  Patient can return home with the following Help with stairs or ramp for entrance;Two people to help with bathing/dressing/bathroom;Direct  supervision/assist for medications management;Two people to help with walking and/or transfers;Assistance with feeding;Assist for transportation;Direct supervision/assist for financial management;Assistance with cooking/housework   Equipment Recommendations  None recommended by PT    Recommendations for Other Services       Precautions / Restrictions Precautions Precautions: Fall Precaution Comments: foley catheter Required Braces or Orthoses: Other Brace Other Brace: B PRAFO Restrictions Weight Bearing Restrictions: Yes RLE Weight Bearing: Partial weight bearing LLE Weight Bearing: Partial weight bearing Other Position/Activity Restrictions: per secure chat with MD "minimal weight on either leg" for 8-10 weeks     Mobility  Bed Mobility Overal bed mobility: Needs Assistance Bed Mobility: Rolling, Sit to Supine Rolling: Max assist, +2 for physical assistance     Sit to supine: Max assist, +2 for physical assistance, HOB elevated   General bed mobility comments: pt assists with UE on bed rails to roll; sit to supine pt utilizes WB on UE with assist at trunk and bilat LEs    Transfers Overall transfer level: Needs assistance Equipment used:  (hoyer lift) Transfers: Bed to chair/wheelchair/BSC             General transfer comment: Transferred from chair to sitting EOB using hoyer lift Transfer via Lift Equipment:  Harrel Lemon)  Ambulation/Gait               General Gait Details: NT; pt non-ambulatory without body weight support system   Stairs             Wheelchair Mobility    Modified Rankin (Stroke Patients Only)       Balance Overall balance assessment: Needs assistance Sitting-balance support: Feet unsupported, Bilateral upper extremity supported  Sitting balance-Leahy Scale: Poor Sitting balance - Comments: with feet and back unsupported, pt utilized bilat UE support on EOB to maintain static sitting balance; pt able to sit EOB without UE  support for ~8 seconds during session       Standing balance comment: NT; pt partial weight bearing and declined standing attempt this date                            Cognition Arousal/Alertness: Awake/alert Behavior During Therapy: WFL for tasks assessed/performed Overall Cognitive Status: Within Functional Limits for tasks assessed                                          Exercises Total Joint Exercises Ankle Circles/Pumps: AROM, Strengthening, 10 reps, Both, Seated Quad Sets: AROM, Left, 10 reps, Other (comment) (seated in recliner with LEs elevated) Gluteal Sets: AROM, Strengthening, Both, 10 reps, Other (comment) (seated in recliner with bilat LEs elevated) Heel Slides: PROM, Left, 10 reps, Other (comment) (seated in recliner with bilat LEs elevated) Straight Leg Raises: PROM, Left, 10 reps, Other (comment) (seated in recliner with bilat LEs elevated) Long Arc Quad: AROM, PROM, Right, Left, 10 reps, Seated, Other (comment) (Pt able to complete LAQ through limited ROM on L LE; PROM on R LE) Other Exercises Other Exercises: Seated marching - pt attempted; muscle activation present with pt unable to lift LEs against gravity in sitting Other Exercises: Pt completed sitting balance EOB for ~3 minutes. Other Exercises: Pt had BM in chair; once transferred to bed via hoyer lift, pt transferred to supine and rolled bilat with Total A for pericare    General Comments        Pertinent Vitals/Pain Pain Assessment Pain Assessment: 0-10 Pain Score: 8  Pain Location: LLE Pain Descriptors / Indicators: Discomfort, Dull Pain Intervention(s): Limited activity within patient's tolerance, Monitored during session, Repositioned, Premedicated before session    Home Living                          Prior Function            PT Goals (current goals can now be found in the care plan section) Acute Rehab PT Goals Patient Stated Goal: get better PT  Goal Formulation: With patient Time For Goal Achievement: 08/31/21 Potential to Achieve Goals: Fair Progress towards PT goals: Progressing toward goals    Frequency    Min 2X/week      PT Plan Current plan remains appropriate    Co-evaluation              AM-PAC PT "6 Clicks" Mobility   Outcome Measure  Help needed turning from your back to your side while in a flat bed without using bedrails?: Total Help needed moving from lying on your back to sitting on the side of a flat bed without using bedrails?: Total Help needed moving to and from a bed to a chair (including a wheelchair)?: Total Help needed standing up from a chair using your arms (e.g., wheelchair or bedside chair)?: Total Help needed to walk in hospital room?: Total Help needed climbing 3-5 steps with a railing? : Total 6 Click Score: 6    End of Session   Activity Tolerance: Patient tolerated treatment well Patient left: in bed;with call bell/phone within reach;with bed  alarm set Nurse Communication: Mobility status PT Visit Diagnosis: Difficulty in walking, not elsewhere classified (R26.2);Other abnormalities of gait and mobility (R26.89)     Time: 7680-8811 PT Time Calculation (min) (ACUTE ONLY): 40 min  Charges:                        Rella Larve, SPT  Rajat Staver 08/20/2021, 3:41 PM

## 2021-08-20 NOTE — TOC Progression Note (Signed)
Transition of Care [facility]) - Progression Note    Patient Details  Name: Sherry [name] MRN: [id number] Date of Birth: 10/09/1934  Transition of [facility]) CM/SW Contact  Candie Chroman, LCSW Phone Number: 08/20/2021, 8:18 AM  Clinical Narrative:  Josem Kaufmann approved: [id number]. Valid 7/19-7/21. [facility] admissions coordinator is aware.   Expected Discharge Plan: [place] Barriers to Discharge: Continued Medical Work up  Expected Discharge Plan and Services Expected Discharge Plan: [place] Choice: [place] arrangements for the past 2 months: [place]                                       Social Determinants of Health (SDOH) Interventions    Readmission Risk Interventions     No data to display

## 2021-08-20 NOTE — Care Management Important Message (Signed)
Important Message  Patient Details  Name: Sherry [name] MRN: [id number] Date of Birth: 02-25-1934   Medicare Important Message Given:  Yes     Juliann Pulse A Cordaro Mukai 08/20/2021, 2:16 PM

## 2021-08-20 NOTE — Progress Notes (Signed)
Subjective: 4 Days Post-Op Procedure(s) (LRB): INTRAMEDULLARY (IM) NAIL FEMORAL (Left) The patient is comfortable today.  Her daughter is at the bedside.  We going to use a Hoyer lift to get her out of bed since apparently they were using it at [facility].  I thought that she was more mobile than after apparently that was not the case.  Hemoglobin is better and more stable.  Plan on discharging her back to [facility] if she tolerates getting up to a chair.  Again, I discussed possible surgery on right hip with the patient and her daughter but they do not feel that that to her benefit at this time.  Patient reports pain as mild.  Objective:   VITALS:   Vitals:   08/20/21 0816 08/20/21 1622  BP: (!) 111/58 (!) 129/57  Pulse: 81 88  Resp: 16 16  Temp: 98.3 F (36.8 C) 98.1 F (36.7 C)  SpO2: 100% 95%    Incision: dressing C/D/I  LABS Recent Labs    08/18/21 0423 08/18/21 1759 08/19/21 0333 08/20/21 0443 08/20/21 1029  HGB 7.7* 9.1* 8.9* 7.8* 8.6*  HCT 22.7* 27.4* 27.5* 23.7*  --   WBC 13.0*  --  10.6* 8.9  --   PLT 180  --  198 216  --     Recent Labs    08/18/21 0423 08/19/21 0333 08/20/21 0443  NA 124* 130* 130*  K 4.6 5.1 4.5  BUN 29* 23 15  CREATININE 0.96 0.72 0.54  GLUCOSE 117* 117* 101*    No results for input(s): "LABPT", "INR" in the last 72 hours.   Assessment/Plan: 4 Days Post-Op Procedure(s) (LRB): INTRAMEDULLARY (IM) NAIL FEMORAL (Left)   Advance diet Up with therapy Discharge to SNF  Follow-up in my office in 2 weeks.

## 2021-08-20 NOTE — Progress Notes (Signed)
Subjective: 4 Days Post-Op Procedure(s) (LRB): INTRAMEDULLARY (IM) NAIL FEMORAL (Left)   Patient was seen Wednesday, 08/19/2021 in her hospital bed.  Her daughter and son-in-law are present.  She is comfortable.  There are hemoglobin was more stable.  They discussed further surgery on her right hip and do not wish to pursue that at this time.  She has no pain in the right hip.  The left hip is doing much better.  Dressings are changed and the wound looks good.  She has not been out of bed yet and I would like her to be able to transfer bed to chair before going back to the nursing home.  Patient reports pain as mild.  Objective:   VITALS:   Vitals:   08/20/21 0313 08/20/21 0816  BP: (!) 98/55 (!) 111/58  Pulse: 78 81  Resp: 18 16  Temp: 98.4 F (36.9 C) 98.3 F (36.8 C)  SpO2: 95% 100%    Incision: dressing C/D/I  LABS Recent Labs    08/18/21 0423 08/18/21 1759 08/19/21 0333 08/20/21 0443  HGB 7.7* 9.1* 8.9* 7.8*  HCT 22.7* 27.4* 27.5* 23.7*  WBC 13.0*  --  10.6* 8.9  PLT 180  --  198 216    Recent Labs    08/18/21 0423 08/19/21 0333 08/20/21 0443  NA 124* 130* 130*  K 4.6 5.1 4.5  BUN 29* 23 15  CREATININE 0.96 0.72 0.54  GLUCOSE 117* 117* 101*    No results for input(s): "LABPT", "INR" in the last 72 hours.   Assessment/Plan: 4 Days Post-Op Procedure(s) (LRB): INTRAMEDULLARY (IM) NAIL FEMORAL (Left)   Advance diet Up with therapy Patient may return to her nursing home when able to get bed to chair. Advised patient and her family that if fracture on the right hip may be recent but is not causing her any pain or other problems.  Leg remains shortened and will remain so.  I told him I was willing to pursue any further treatment they wish.

## 2021-08-20 NOTE — Progress Notes (Signed)
Harbor A Wich to be D/C'd [place] per MD order.  Discussed prescriptions and follow up appointments with the patient and daughter, Report given to [place], Therapist, sports at [facility].  Allergies as of 08/20/2021       Reactions   Other Swelling   Lisinopril-hydrochlorothiazide Swelling   Swelling of the tongue   Beef-derived Products Swelling   Facial and lip swelling (due to tick bite)   Dairy Aid [tilactase] Swelling   Lisinopril-hydrochlorothiazide Swelling   Tongue swelling   Pork-derived Products Swelling        Medication List     STOP taking these medications    spironolactone 50 MG tablet Commonly known as: ALDACTONE       TAKE these medications    acetaminophen 500 MG tablet Commonly known as: TYLENOL Take 500 mg by mouth every 12 (twelve) hours as needed for mild pain. What changed: Another medication with the same name was removed. Continue taking this medication, and follow the directions you see here.   acetic acid 0.25 % irrigation Irrigate with 1 Application as directed daily at 6 (six) AM.   alendronate 70 MG tablet Commonly known as: FOSAMAX Take 70 mg by mouth every Saturday.   alum & mag hydroxide-simeth 200-200-20 MG/5ML suspension Commonly known as: MAALOX/MYLANTA Take 30 mLs by mouth every 4 (four) hours as needed for indigestion.   Beano Tabs Take 1 tablet by mouth 3 (three) times daily.   calcium carbonate 500 MG chewable tablet Commonly known as: TUMS - dosed in mg elemental calcium Chew 2 tablets by mouth every 8 (eight) hours as needed for heartburn.   Capsaicin 0.1 % Crea Apply 1 application  topically every evening. (Apply to hands)   carboxymethylcellulose 0.5 % Soln Commonly known as: REFRESH PLUS Place 2 drops into both eyes every 6 (six) hours as needed (dry eyes).   cholecalciferol 25 MCG (1000 UNIT) tablet Commonly known as: VITAMIN D3 Take 1,000 Units by mouth daily.   diphenhydrAMINE 25 mg  capsule Commonly known as: BENADRYL Take 25-50 mg by mouth every 6 (six) hours as needed for itching.   DULoxetine 60 MG capsule Commonly known as: CYMBALTA Take 60 mg by mouth daily.   ENDIT EX Apply 1 Application topically See admin instructions. Apply to gluteal fold/peri area once per shift   EPINEPHrine 0.3 mg/0.3 mL Soaj injection Commonly known as: EPI-PEN Inject 0.3 mg into the muscle as needed for anaphylaxis.   ferrous sulfate 325 (65 FE) MG tablet Take 1 tablet (325 mg total) by mouth daily with breakfast. Start taking on: August 21, 2021   gabapentin 300 MG capsule Commonly known as: NEURONTIN Take 300 mg by mouth in the morning.   gabapentin 600 MG tablet Commonly known as: NEURONTIN Take 600 mg by mouth at bedtime.   guaiFENesin 600 MG 12 hr tablet Commonly known as: MUCINEX Take 600 mg by mouth 2 (two) times daily as needed for to loosen phlegm.   lactulose (encephalopathy) 10 GM/15ML Soln Commonly known as: CHRONULAC Take 20 g by mouth daily as needed (mild constipation).   loratadine 10 MG tablet Commonly known as: CLARITIN Take 10 mg by mouth daily.   magnesium citrate solution Take 60-120 mLs by mouth daily as needed (constipation).   methylcellulose oral powder Take 1 packet by mouth at bedtime.   mometasone 0.1 % lotion Commonly known as: ELOCON Apply 1 Application topically every Saturday at 6 PM. (Apply to the ear canal for eczema)  multivitamin with minerals Tabs tablet Take 1 tablet by mouth daily.   oxyCODONE-acetaminophen 5-325 MG tablet Commonly known as: PERCOCET/ROXICET Take 1 tablet by mouth every 4 (four) hours as needed for moderate pain.   polyethylene glycol 17 g packet Commonly known as: MIRALAX / GLYCOLAX Take 17 g by mouth 2 (two) times daily.   rivaroxaban 20 MG Tabs tablet Commonly known as: XARELTO Take 1 tablet (20 mg total) by mouth daily with supper. What changed:  how much to take when to take this    saccharomyces boulardii 250 MG capsule Commonly known as: FLORASTOR Take 250 mg by mouth daily.   Saline Spray 0.2 % Soln Place 2 sprays into both nostrils as needed (nasal congestion).   sodium phosphate 7-19 GM/118ML Enem Place 1 enema rectally every other day as needed for severe constipation.   tiZANidine 2 MG tablet Commonly known as: ZANAFLEX Take 2 mg by mouth 3 (three) times daily.   triamcinolone cream 0.1 % Commonly known as: KENALOG Apply 1 Application topically every 8 (eight) hours as needed (rashy/itchy skin). (Apply to thighs, lower legs and upper arms)               Discharge Care Instructions  (From admission, onward)           Start     Ordered   08/20/21 0000  Leave dressing on - Keep it clean, dry, and intact until clinic visit        08/20/21 1338            Vitals:   08/20/21 0816 08/20/21 1622  BP: (!) 111/58 (!) 129/57  Pulse: 81 88  Resp: 16 16  Temp: 98.3 F (36.8 C) 98.1 F (36.7 C)  SpO2: 100% 95%    Skin clean, dry and intact without evidence of skin break down, no evidence of skin tears noted. IV catheter discontinued intact. Site without signs and symptoms of complications. Dressing and pressure applied. Pt denies pain at this time. No complaints noted.   Patient escorted via stretcher, and D/C to [facility] via [employer].  Rolley Sims

## 2021-08-20 NOTE — TOC Transition Note (Signed)
Transition of Care [facility]) - CM/SW Discharge Note   Patient Details  Name: Sherry [name] MRN: [id number] Date of Birth: Dec 26, 1934  [facility] CM/SW Contact:  Candie Chroman, LCSW Phone Number: 08/20/2021, 2:44 PM   Clinical Narrative:   Patient has orders to discharge to [facility] today. RN will call report to [phone] (Room 219). EMS transport set up for 4:00. No further concerns. CSW signing off.  Final next level of care: Skilled Nursing Facility Barriers to Discharge: Barriers Resolved   Patient Goals and CMS Choice     Choice offered to / list presented to : Patient, Adult Children  Discharge Placement   Existing PASRR number confirmed : 08/20/21          Patient chooses bed at: [facility] Patient to be transferred to facility by: EMS Name of family member notified: [name] Patient and family notified of of transfer: 08/20/21  Discharge Plan and Services     Post Acute Care Choice: [place]                               Social Determinants of Health (SDOH) Interventions     Readmission Risk Interventions     No data to display

## 2021-08-21 DIAGNOSIS — D62 Acute posthemorrhagic anemia: Secondary | ICD-10-CM | POA: Diagnosis not present

## 2021-08-21 DIAGNOSIS — M84459A Pathological fracture, hip, unspecified, initial encounter for fracture: Secondary | ICD-10-CM | POA: Diagnosis not present

## 2021-08-23 ENCOUNTER — Encounter: Payer: Self-pay | Admitting: Internal Medicine

## 2021-08-24 ENCOUNTER — Ambulatory Visit: Payer: Medicare HMO | Admitting: Occupational Therapy

## 2021-08-24 ENCOUNTER — Ambulatory Visit: Payer: Medicare HMO

## 2021-08-25 DIAGNOSIS — I872 Venous insufficiency (chronic) (peripheral): Secondary | ICD-10-CM | POA: Diagnosis not present

## 2021-08-25 DIAGNOSIS — M84459A Pathological fracture, hip, unspecified, initial encounter for fracture: Secondary | ICD-10-CM | POA: Diagnosis not present

## 2021-08-25 NOTE — Telephone Encounter (Signed)
I have evaluated her twice within the last 4 days.  I do not see any evidence of infection.  There was initially a lot of serous drainage but this was likely due to the fact that her spironolactone was stopped in the hospital and she had significant swelling.  Her spironolactone has been restarted.  I have also had them reach out to the surgeon for further guidance as well.

## 2021-08-25 NOTE — Telephone Encounter (Signed)
See prior message

## 2021-08-26 ENCOUNTER — Ambulatory Visit: Payer: Medicare HMO

## 2021-08-26 ENCOUNTER — Ambulatory Visit: Payer: Medicare HMO | Admitting: Occupational Therapy

## 2021-08-28 DIAGNOSIS — M84459A Pathological fracture, hip, unspecified, initial encounter for fracture: Secondary | ICD-10-CM | POA: Diagnosis not present

## 2021-08-28 DIAGNOSIS — T8149XA Infection following a procedure, other surgical site, initial encounter: Secondary | ICD-10-CM | POA: Diagnosis not present

## 2021-08-28 DIAGNOSIS — S71002A Unspecified open wound, left hip, initial encounter: Secondary | ICD-10-CM | POA: Diagnosis not present

## 2021-08-31 ENCOUNTER — Ambulatory Visit: Payer: Medicare HMO | Admitting: Occupational Therapy

## 2021-08-31 ENCOUNTER — Ambulatory Visit: Payer: Medicare HMO

## 2021-09-02 ENCOUNTER — Ambulatory Visit: Payer: Medicare HMO

## 2021-09-02 ENCOUNTER — Ambulatory Visit: Payer: Medicare HMO | Admitting: Occupational Therapy

## 2021-09-07 ENCOUNTER — Ambulatory Visit: Payer: Medicare HMO

## 2021-09-07 ENCOUNTER — Ambulatory Visit: Payer: Medicare HMO | Admitting: Occupational Therapy

## 2021-09-09 ENCOUNTER — Ambulatory Visit: Payer: Medicare HMO

## 2021-09-09 ENCOUNTER — Ambulatory Visit: Payer: Medicare HMO | Admitting: Occupational Therapy

## 2021-09-09 DIAGNOSIS — I872 Venous insufficiency (chronic) (peripheral): Secondary | ICD-10-CM

## 2021-09-09 DIAGNOSIS — S71002A Unspecified open wound, left hip, initial encounter: Secondary | ICD-10-CM

## 2021-09-14 ENCOUNTER — Ambulatory Visit: Payer: Medicare HMO

## 2021-09-14 ENCOUNTER — Ambulatory Visit: Payer: Medicare HMO | Admitting: Occupational Therapy

## 2021-09-14 ENCOUNTER — Ambulatory Visit: Payer: Medicare HMO | Attending: Internal Medicine | Admitting: Occupational Therapy

## 2021-09-14 DIAGNOSIS — M6281 Muscle weakness (generalized): Secondary | ICD-10-CM | POA: Insufficient documentation

## 2021-09-14 DIAGNOSIS — R278 Other lack of coordination: Secondary | ICD-10-CM | POA: Insufficient documentation

## 2021-09-14 NOTE — Therapy (Signed)
OUTPATIENT OCCUPATIONAL THERAPY EVALUATION NOTE   Patient Name: Sherry [name] MRN: [id number] DOB:06-Dec-1934, 87 y.o., female Today's Date: 09/14/2021                                                                                                                                                         PCP: Dr. Viviana Simpler REFERRING PROVIDER: Dr. Viviana Simpler   OT End of Session - 09/14/21 1111     Visit Number 1    Number of Visits 24    Date for OT Re-Evaluation 12/07/21    Authorization Time Period Progress report period starting 09/14/2021    OT Start Time 1100    OT Stop Time 1145    OT Time Calculation (min) 45 min    Activity Tolerance Patient tolerated treatment well    Behavior During Therapy [facility] for tasks assessed/performed             Past Medical History:  Diagnosis Date   Acute blood loss anemia    Arthritis    Cancer ([place])    skin   Central cord syndrome at C6 level of cervical spinal cord ([place]) 11/29/2017   Hypertension    Protein-calorie malnutrition, severe ([place]) 01/24/2018   S/P insertion of IVC (inferior vena caval) filter 01/24/2018   Tetraparesis ([place])    Past Surgical History:  Procedure Laterality Date   ANTERIOR CERVICAL DECOMP/DISCECTOMY FUSION N/A 11/29/2017   Procedure: Cervical five-six, six-seven Anterior Cervical Decompression Fusion;  Surgeon: Judith Part, MD;  Location: [place];  Service: Neurosurgery;  Laterality: N/A;  Cervical five-six, six-seven Anterior Cervical Decompression Fusion   CATARACT EXTRACTION     FEMUR IM NAIL Left 08/16/2021   Procedure: INTRAMEDULLARY (IM) NAIL FEMORAL;  Surgeon: Earnestine Leys, MD;  Location: [facility];  Service: Orthopedics;  Laterality: Left;   IR IVC FILTER PLMT / S&I /IMG GUID/MOD SED  12/08/2017   Patient Active Problem List   Diagnosis Date Noted   Closed hip fracture ([place]) 08/15/2021   DVT (deep venous thrombosis) ([place]) 08/15/2021   Quadriplegia ([place]) 08/15/2021   Fall  08/15/2021   Acute appendicitis with appendiceal abscess 05/30/2018   Hypocalcemia 02/15/2018   Dysuria 02/15/2018   Bilateral lower extremity edema 02/11/2018   Vaginal yeast infection 01/30/2018   Chest tightness 01/27/2018   Healthcare-associated pneumonia 01/25/2018   Pleural effusion, not elsewhere classified 01/25/2018   Acute deep vein thrombosis (DVT) of left lower extremity ([place]) 01/24/2018   Acute deep vein thrombosis (DVT) of right lower extremity ([place]) 01/24/2018   Chronic allergic rhinitis 01/24/2018   Depression with anxiety 01/24/2018   UTI due to Klebsiella species 01/24/2018   Protein-calorie malnutrition, severe ([place]) 01/24/2018   S/P insertion of IVC (inferior vena caval) filter 01/24/2018   Reactive depression    Hypokalemia  Leukocytosis    Essential hypertension    Trauma    Tetraparesis (HCC)    Neuropathic pain    Neurogenic bowel    Neurogenic bladder    Benign essential HTN    Acute postoperative anemia due to expected blood loss    Central cord syndrome at C6 level of cervical spinal cord ([place]) 11/29/2017   Central cord syndrome ([place]) 11/29/2017   SCC (squamous cell carcinoma) 04/08/2014      REFERRING DIAG: Central Cord Syndrome at C6 of the Cervical Spinal Cord, Fall with Bilateral Closed Hip Fractures, with ORIF repair with Intramedullary Nailing of the Left Hip Fracture.  THERAPY DIAG:  Muscle weakness (generalized)  Rationale for Evaluation and Treatment Rehabilitation  PERTINENT HISTORY: Patient s/p fall November 29, 2017 resulting in diagnosis of central cord syndrome at C 6 level.  She has had therapy in multiple venues but with recent move to [facility], therapy staff recommended she seek outpatient therapy for her needs. Pt. Had a recent fall with Bilateral Closed Hip Fractures, with ORIF repair with Intramedullary Nailing of the Left Hip Fracture.   PRECAUTIONS: None  SUBJECTIVE:   Pt. Reports doing well today  PAIN:  Are you  having pain? No     OBJECTIVE:   TODAY'S TREATMENT:    Pt. Has been cleared to return for OT  after having been discharged due being hospitalized on 08/15/2021. Pt. sustained Bilateral Closed Hip Fractures during fall. Pt. Underwent ORIF repair of the left Hip Fracture. And no surgical intervention for repair of the Right. Pt. Is  currently NWB through the LEs. Measurements were obtained, and goals were reviewed and new goals were established. Pt. tolerated PROM followed by AROM bilateral wrist extension, PROM for bilateral digit MP, PIP, and DIP flexion, and extension, thumb radial, and palmar abduction in conjunction with moist heat.  Pt. continues to present with BUE weakness, tightness at the bilateral thumb webspace, and bilateral wrist flexor tightness,  as well as digit MP, PIP, and DIP extensor tightness.  These limitations make it difficult tfor her to perform hair care efficiently, write legibly, or efficiently, and turn pages of a book. Pt. continues to benefit from working on impoving ROM in order to work towards increasing engagement of her bilateral hands during ADLs, and IADL tasks.          PATIENT EDUCATION: Education details: BUE ROM, hand function, UE functioning Person educated: Patient Education method: Explanation and Demonstration Education comprehension: verbalized understanding, returned demonstration, and needs further education   HOME EXERCISE PROGRAM Continue with ongoing HEPs     OT Long Term Goals - 07/06/21 1106                                                                                          Target Date: 12/07/2021    OT LONG TERM GOAL #1   Title Patient and caregiver will demonstrate understanding of home exercise program for ROM.    Baseline 09/14/2021:  Pt. Reports inconsistencies with restorative ROM at  her care home. Pt/caregiver education to be provided about ongoing ROM needs    Time 12  Period Weeks    Status New     OT LONG TERM  GOAL #2   Title Pt.  Will improve BUE strength to be able to sustain her UE's in elevation to efficiently, and effectively perform hair care, and self-grooming tasks.   Baseline 09/14/2021:  BUE shoulder strength: 3+/5 for shoulder flexion, and abduction.   Time 12    Period Weeks    Status New               OT LONG TERM GOAL #3   Title Patient will demonstrate improved composite finger flexion to hold deodorant to apply to underarms.    Baseline 01/12/2021: Pt. Presents with limited digit extension. Pt. Is able to initiate holding deodorant, however is unable to hold it while using it.   Time 12    Period Weeks    Status New   Target Date                  OT LONG TERM GOAL #4   Title Pt. Will improve bilateral wrist extension in preparation for anticipating, and initiating reaching for objects at the table.    Baseline 09/14/2021:  Right: 17(35), left 2(15)   Time    Period    Status    Target Date      OT LONG TERM GOAL #5   Title Pt. will write, and sign her name with 100% legibility, and modified independence.    Baseline 09/14/2021: Pt. continues to fill out her daily menu and complete puzzles. Pt. Continues to present with limited writing legibility.   Time 12    Period Weeks    Status  New          OT LONG TERM GOAL  #6   TITLE Pt. will turn pages in a book with modified independence.    Baseline 09/14/2021: Pt. has difficulty turning pages.    Time 12    Period Weeks    Status New                  Plan - 07/22/21 1737     Clinical Impression Statement Pt. Has been cleared to return for OT  after having been discharged due being hospitalized on 08/15/2021. Pt. sustained Bilateral Closed Hip Fractures during fall. Pt. Underwent ORIF repair of the left Hip Fracture. And no surgical intervention for repair of the Right. Pt. Is  currently NWB through the LEs. Measurements were obtained, and goals were reviewed and new goals were established. Pt. tolerated PROM  followed by AROM bilateral wrist extension, PROM for bilateral digit MP, PIP, and DIP flexion, and extension, thumb radial, and palmar abduction in conjunction with moist heat.  Pt. continues to present with BUE weakness, tightness at the bilateral thumb webspace, and bilateral wrist flexor tightness,  as well as digit MP, PIP, and DIP extensor tightness.  These limitations make it difficult tfor her to perform hair care efficiently, write legibly, or efficiently, and turn pages of a book. Pt. continues to benefit from working on impoving ROM in order to work towards increasing engagement of her bilateral hands during ADLs, and IADL tasks.                    OT Occupational Profile and History Detailed Assessment- Review of Records and additional review of physical, cognitive, psychosocial history related to current functional performance    Occupational performance deficits (Please refer to evaluation for details): ADL's;IADL's;Leisure;Social Participation  Body Structure / Function / Physical Skills ADL;Continence;Dexterity;Flexibility;Strength;ROM;Balance;Coordination;FMC;IADL;Endurance;UE functional use;Decreased knowledge of use of DME;GMC    Psychosocial Skills Coping Strategies    Rehab Potential Fair    Clinical Decision Making Several treatment options, min-mod task modification necessary    Comorbidities Affecting Occupational Performance: Presence of comorbidities impacting occupational performance    Comorbidities impacting occupational performance description: contractures of bilateral hands, dependent transfers,    Modification or Assistance to Complete Evaluation  No modification of tasks or assist necessary to complete eval    OT Frequency 2x / week    OT Duration 12 weeks    OT Treatment/Interventions Self-care/ADL training;Cryotherapy;Therapeutic exercise;DME and/or AE instruction;Balance training;Neuromuscular education;Manual Therapy;Splinting;Moist Heat;Passive range of  motion;Therapeutic activities;Patient/family education    Plan continue to progress ROM of digits, wrists and shoulders as it pertains to completion of ADL tasks that pt values.    Consulted and Agree with Plan of Care Patient            Harrel Carina, MS, OTR/L  Harrel Carina, OT 09/14/2021, 11:16 AM

## 2021-09-14 NOTE — Therapy (Deleted)
[place] [facility] SERVICES [address] [place], [place], [address] Phone: [phone]   Fax:  [phone]  September 14, 2021    No Recipients  Occupational Therapy Discharge Summary   Patient: MAISON AGRUSA MRN: [id number] Date of Birth: 05/08/1934  The above patient had been seen in outpatient Occupational Therapy for 137 treatment sessions for ADL training, A/E training, neuromuscular re-education, positioning, there. Ex, and pt./caregiver education. The patient has been admitted to [facility] secondary to sustaining bilateral Hip fractures following a fall. Outpatient OT services are discharged at this time.   Sincerely,  Harrel Carina, MS, OTR/L   Harrel Carina, OT  CC No Recipients  [place] MAIN [facility] SERVICES [address] [place], [place], [address] Phone: [phone]   Fax:  [phone]  Patient: RYLIN SAEZ MRN: [id number] Date of Birth: 04/15/1934

## 2021-09-16 ENCOUNTER — Ambulatory Visit: Payer: Medicare HMO | Admitting: Occupational Therapy

## 2021-09-16 ENCOUNTER — Ambulatory Visit: Payer: Medicare HMO

## 2021-09-16 DIAGNOSIS — R278 Other lack of coordination: Secondary | ICD-10-CM

## 2021-09-16 DIAGNOSIS — M6281 Muscle weakness (generalized): Secondary | ICD-10-CM

## 2021-09-16 NOTE — Therapy (Addendum)
OUTPATIENT OCCUPATIONAL THERAPY EVALUATION NOTE   Patient Name: Sherry [name] MRN: [id number] DOB:09-04-1934, 87 y.o., female Today's Date: 09/16/2021                                                                                                                                                         PCP: Dr. Viviana Simpler REFERRING PROVIDER: Dr. Viviana Simpler   OT End of Session - 09/16/21 1208     Visit Number 2    Number of Visits 24    Date for OT Re-Evaluation 12/07/21    Authorization Time Period Progress report period starting 09/14/2021    OT Start Time 65    OT Stop Time 1145    OT Time Calculation (min) 45 min    Equipment Utilized During Treatment moist heat    Activity Tolerance Patient tolerated treatment well    Behavior During Therapy WFL for tasks assessed/performed             Past Medical History:  Diagnosis Date   Acute blood loss anemia    Arthritis    Cancer ([place])    skin   Central cord syndrome at C6 level of cervical spinal cord ([place]) 11/29/2017   Hypertension    Protein-calorie malnutrition, severe ([place]) 01/24/2018   S/P insertion of IVC (inferior vena caval) filter 01/24/2018   Tetraparesis ([place])    Past Surgical History:  Procedure Laterality Date   ANTERIOR CERVICAL DECOMP/DISCECTOMY FUSION N/A 11/29/2017   Procedure: Cervical five-six, six-seven Anterior Cervical Decompression Fusion;  Surgeon: Judith Part, MD;  Location: [facility];  Service: Neurosurgery;  Laterality: N/A;  Cervical five-six, six-seven Anterior Cervical Decompression Fusion   CATARACT EXTRACTION     FEMUR IM NAIL Left 08/16/2021   Procedure: INTRAMEDULLARY (IM) NAIL FEMORAL;  Surgeon: Earnestine Leys, MD;  Location: [facility];  Service: Orthopedics;  Laterality: Left;   IR IVC FILTER PLMT / S&I /IMG GUID/MOD SED  12/08/2017   Patient Active Problem List   Diagnosis Date Noted   Closed hip fracture ([place]) 08/15/2021   DVT (deep venous thrombosis) ([place]) 08/15/2021    Quadriplegia ([place]) 08/15/2021   Fall 08/15/2021   Acute appendicitis with appendiceal abscess 05/30/2018   Hypocalcemia 02/15/2018   Dysuria 02/15/2018   Bilateral lower extremity edema 02/11/2018   Vaginal yeast infection 01/30/2018   Chest tightness 01/27/2018   Healthcare-associated pneumonia 01/25/2018   Pleural effusion, not elsewhere classified 01/25/2018   Acute deep vein thrombosis (DVT) of left lower extremity ([place]) 01/24/2018   Acute deep vein thrombosis (DVT) of right lower extremity ([place]) 01/24/2018   Chronic allergic rhinitis 01/24/2018   Depression with anxiety 01/24/2018   UTI due to Klebsiella species 01/24/2018   Protein-calorie malnutrition, severe ([place]) 01/24/2018   S/P insertion of IVC (inferior vena caval) filter 01/24/2018  Reactive depression    Hypokalemia    Leukocytosis    Essential hypertension    Trauma    Tetraparesis (HCC)    Neuropathic pain    Neurogenic bowel    Neurogenic bladder    Benign essential HTN    Acute postoperative anemia due to expected blood loss    Central cord syndrome at C6 level of cervical spinal cord ([place]) 11/29/2017   Central cord syndrome ([place]) 11/29/2017   SCC (squamous cell carcinoma) 04/08/2014      REFERRING DIAG: Central Cord Syndrome at C6 of the Cervical Spinal Cord, Fall with Bilateral Closed Hip Fractures, with ORIF repair with Intramedullary Nailing of the Left Hip Fracture.  THERAPY DIAG:  Muscle weakness (generalized)  Other lack of coordination  Rationale for Evaluation and Treatment Rehabilitation  PERTINENT HISTORY: Patient s/p fall November 29, 2017 resulting in diagnosis of central cord syndrome at C 6 level.  She has had therapy in multiple venues but with recent move to [facility], therapy staff recommended she seek outpatient therapy for her needs. Pt. Had a recent fall with Bilateral Closed Hip Fractures, with ORIF repair with Intramedullary Nailing of the Left Hip Fracture.   PRECAUTIONS:  None  SUBJECTIVE:   Pt. Reports that her family is visiting from [place].   PAIN:  Are you having pain? No     OBJECTIVE:   TODAY'S TREATMENT:    Therapeutic Ex.     Pt. tolerated soft tissue massage to the volar, and dorsal surface of each digit on the bilateral hands secondary to stiffness. Pt. tolerated PROM followed by AROM bilateral wrist extension, PROM for bilateral digit MP, PIP, and DIP flexion, and extension, thumb radial, and palmar abduction in conjunction with moist heat. BUE strengthening with 2# dowel for bilateral shoulder flexion, chest presses, and elbow flexion.Pt. requires rest breaks and verbal cues for proper technique. Emphasis was placed on left wrist extension, bilateral thumb radial, and palmar abduction, and bilateral digit MP, PIP, and DIP flexion. Pt. worked on [employer], and reciprocal motion using the UBE while seated for 66mn. with no resistance. Constant monitoring was provided. BUE ROM, and strengthening was performed in preparation for functional reaching, and functional hand use. Manual therapy was performed independent of, and in preparation for therapeutic Ex.   Pt. tolerated Manual therapy, and UE there. Ex well today without reports of pain following moist heat modality to the bilateral shoulders, and hands. Pt. tolerated the UBE for 3 min. Today.  Pt. Required cues for visual demonstration of, and cues for technique with the UE exercises. Pt. Presents with tightness at the bilateral thumb webspace, and bilateral wrist flexor tightness,  as well as digit MP, PIP, and DIP extensor tightness. Pt. continues to benefit from working on impoving ROM in order to work towards increasing engagement of her bilateral hands during ADLs, and IADL tasks.         PATIENT EDUCATION: Education details: BUE ROM, hand function, UE functioning Person educated: Patient Education method: Explanation and Demonstration Education comprehension: verbalized understanding,  returned demonstration, and needs further education   HOME EXERCISE PROGRAM Continue with ongoing HEPs     OT Long Term Goals - 07/06/21 1106  Target Date: 12/07/2021    OT LONG TERM GOAL #1   Title Patient and caregiver will demonstrate understanding of home exercise program for ROM.    Baseline 09/14/2021:  Pt. Reports inconsistencies with restorative ROM at  her care home. Pt/caregiver education to be provided about ongoing ROM needs    Time 12    Period Weeks    Status New     OT LONG TERM GOAL #2   Title Pt.  Will improve BUE strength to be able to sustain her UE's in elevation to efficiently, and effectively perform hair care, and self-grooming tasks.   Baseline 09/14/2021:  BUE shoulder strength: 3+/5 for shoulder flexion, and abduction.   Time 12    Period Weeks    Status New               OT LONG TERM GOAL #3   Title Patient will demonstrate improved composite finger flexion to hold deodorant to apply to underarms.    Baseline 01/12/2021: Pt. Presents with limited digit extension. Pt. Is able to initiate holding deodorant, however is unable to hold it while using it.   Time 12    Period Weeks    Status New   Target Date                  OT LONG TERM GOAL #4   Title Pt. Will improve bilateral wrist extension in preparation for anticipating, and initiating reaching for objects at the table.    Baseline 09/14/2021:  Right: 17(35), left 2(15)   Time    Period    Status    Target Date      OT LONG TERM GOAL #5   Title Pt. will write, and sign her name with 100% legibility, and modified independence.    Baseline 09/14/2021: Pt. continues to fill out her daily menu and complete puzzles. Pt. Continues to present with limited writing legibility.   Time 12    Period Weeks    Status  New          OT LONG TERM GOAL  #6   TITLE Pt. will turn pages in a book with modified independence.     Baseline 09/14/2021: Pt. has difficulty turning pages.    Time 12    Period Weeks    Status New                  Plan - 07/22/21 1737     Clinical Impression Statement Pt. tolerated Manual therapy, and UE there. Ex well today without reports of pain following moist heat modality to the bilateral shoulders, and hands. Pt. tolerated the UBE for 3 min. Today.  Pt. Required cues for visual demonstration of, and cues for technique with the UE exercises. Pt. Presents with tightness at the bilateral thumb webspace, and bilateral wrist flexor tightness,  as well as digit MP, PIP, and DIP extensor tightness. Pt. continues to benefit from working on impoving ROM in order to work towards increasing engagement of her bilateral hands during ADLs, and IADL tasks.                              OT Occupational Profile and History Detailed Assessment- Review of Records and additional review of physical, cognitive, psychosocial history related to current functional performance    Occupational performance deficits (Please refer to evaluation for details): ADL's;IADL's;Leisure;Social Participation    Body Structure / Function /  Physical Skills ADL;Continence;Dexterity;Flexibility;Strength;ROM;Balance;Coordination;FMC;IADL;Endurance;UE functional use;Decreased knowledge of use of DME;GMC    Psychosocial Skills Coping Strategies    Rehab Potential Fair    Clinical Decision Making Several treatment options, min-mod task modification necessary    Comorbidities Affecting Occupational Performance: Presence of comorbidities impacting occupational performance    Comorbidities impacting occupational performance description: contractures of bilateral hands, dependent transfers,    Modification or Assistance to Complete Evaluation  No modification of tasks or assist necessary to complete eval    OT Frequency 2x / week    OT Duration 12 weeks    OT Treatment/Interventions Self-care/ADL  training;Cryotherapy;Therapeutic exercise;DME and/or AE instruction;Balance training;Neuromuscular education;Manual Therapy;Splinting;Moist Heat;Passive range of motion;Therapeutic activities;Patient/family education    Plan continue to progress ROM of digits, wrists and shoulders as it pertains to completion of ADL tasks that pt values.    Consulted and Agree with Plan of Care Patient            Harrel Carina, MS, OTR/L  Harrel Carina, OT 09/16/2021, 12:11 PM

## 2021-09-21 ENCOUNTER — Ambulatory Visit: Payer: Medicare HMO

## 2021-09-21 ENCOUNTER — Encounter: Payer: Self-pay | Admitting: Occupational Therapy

## 2021-09-21 ENCOUNTER — Ambulatory Visit: Payer: Medicare HMO | Admitting: Occupational Therapy

## 2021-09-21 DIAGNOSIS — M6281 Muscle weakness (generalized): Secondary | ICD-10-CM

## 2021-09-21 NOTE — Therapy (Signed)
OUTPATIENT OCCUPATIONAL THERAPY NOTE   Patient Name: Sherry [name] MRN: [id number] DOB:05-01-1934, 87 y.o., female Today's Date: 09/21/2021                                                                                                                                                         PCP: Dr. Viviana Simpler REFERRING PROVIDER: Dr. Viviana Simpler   OT End of Session - 09/21/21 1211     Visit Number 3    Number of Visits 24    Date for OT Re-Evaluation 12/07/21    Authorization Time Period Progress report period starting 09/14/2021    OT Start Time 1105    OT Stop Time 1145    OT Time Calculation (min) 40 min    Equipment Utilized During Treatment moist heat    Activity Tolerance Patient tolerated treatment well    Behavior During Therapy WFL for tasks assessed/performed             Past Medical History:  Diagnosis Date   Acute blood loss anemia    Arthritis    Cancer ([place])    skin   Central cord syndrome at C6 level of cervical spinal cord ([place]) 11/29/2017   Hypertension    Protein-calorie malnutrition, severe ([place]) 01/24/2018   S/P insertion of IVC (inferior vena caval) filter 01/24/2018   Tetraparesis ([place])    Past Surgical History:  Procedure Laterality Date   ANTERIOR CERVICAL DECOMP/DISCECTOMY FUSION N/A 11/29/2017   Procedure: Cervical five-six, six-seven Anterior Cervical Decompression Fusion;  Surgeon: Judith Part, MD;  Location: [facility];  Service: Neurosurgery;  Laterality: N/A;  Cervical five-six, six-seven Anterior Cervical Decompression Fusion   CATARACT EXTRACTION     FEMUR IM NAIL Left 08/16/2021   Procedure: INTRAMEDULLARY (IM) NAIL FEMORAL;  Surgeon: Earnestine Leys, MD;  Location: [facility];  Service: Orthopedics;  Laterality: Left;   IR IVC FILTER PLMT / S&I /IMG GUID/MOD SED  12/08/2017   Patient Active Problem List   Diagnosis Date Noted   Closed hip fracture ([place]) 08/15/2021   DVT (deep venous thrombosis) ([place]) 08/15/2021    Quadriplegia ([place]) 08/15/2021   Fall 08/15/2021   Acute appendicitis with appendiceal abscess 05/30/2018   Hypocalcemia 02/15/2018   Dysuria 02/15/2018   Bilateral lower extremity edema 02/11/2018   Vaginal yeast infection 01/30/2018   Chest tightness 01/27/2018   Healthcare-associated pneumonia 01/25/2018   Pleural effusion, not elsewhere classified 01/25/2018   Acute deep vein thrombosis (DVT) of left lower extremity ([place]) 01/24/2018   Acute deep vein thrombosis (DVT) of right lower extremity ([place]) 01/24/2018   Chronic allergic rhinitis 01/24/2018   Depression with anxiety 01/24/2018   UTI due to Klebsiella species 01/24/2018   Protein-calorie malnutrition, severe ([place]) 01/24/2018   S/P insertion of IVC (inferior vena caval) filter 01/24/2018  Reactive depression    Hypokalemia    Leukocytosis    Essential hypertension    Trauma    Tetraparesis (HCC)    Neuropathic pain    Neurogenic bowel    Neurogenic bladder    Benign essential HTN    Acute postoperative anemia due to expected blood loss    Central cord syndrome at C6 level of cervical spinal cord ([place]) 11/29/2017   Central cord syndrome ([place]) 11/29/2017   SCC (squamous cell carcinoma) 04/08/2014      REFERRING DIAG: Central Cord Syndrome at C6 of the Cervical Spinal Cord, Fall with Bilateral Closed Hip Fractures, with ORIF repair with Intramedullary Nailing of the Left Hip Fracture.  THERAPY DIAG:  Muscle weakness (generalized)  Rationale for Evaluation and Treatment Rehabilitation  PERTINENT HISTORY: Patient s/p fall November 29, 2017 resulting in diagnosis of central cord syndrome at C 6 level.  She has had therapy in multiple venues but with recent move to [facility], therapy staff recommended she seek outpatient therapy for her needs. Pt. Had a recent fall with Bilateral Closed Hip Fractures, with ORIF repair with Intramedullary Nailing of the Left Hip Fracture.   PRECAUTIONS: None  SUBJECTIVE:   Pt. Reports  that her family is visiting from [place].   PAIN:  Are you having pain? No     OBJECTIVE:   TODAY'S TREATMENT:    Therapeutic Ex.     Pt. tolerated soft tissue massage to the volar, and dorsal surface of each digit on the bilateral hands secondary to stiffness. Pt. tolerated PROM followed by AROM bilateral wrist extension, PROM for bilateral digit MP, PIP, and DIP flexion, and extension, thumb radial, and palmar abduction in conjunction with moist heat. Constant monitoring was provided. BUE ROM, and strengthening was performed in preparation for functional reaching, and functional hand use. Manual therapy was performed independent of, and in preparation for therapeutic Ex.   Pt. tolerated Manual therapy, and UE there. Ex well today without reports of pain following moist heat modality to the bilateral shoulders, and hands. Pt. Presents with tightness at the bilateral thumb webspace, and bilateral wrist flexor tightness,  as well as digit MP, PIP, and DIP extensor tightness. Pt. continues to benefit from working on impoving ROM in order to work towards increasing engagement of her bilateral hands during ADLs, and IADL tasks.         PATIENT EDUCATION: Education details: BUE ROM, hand function, UE functioning Person educated: Patient Education method: Explanation and Demonstration Education comprehension: verbalized understanding, returned demonstration, and needs further education   HOME EXERCISE PROGRAM Continue with ongoing HEPs     OT Long Term Goals - 07/06/21 1106                                                                                          Target Date: 12/07/2021    OT LONG TERM GOAL #1   Title Patient and caregiver will demonstrate understanding of home exercise program for ROM.    Baseline 09/14/2021:  Pt. Reports inconsistencies with restorative ROM at  her care home. Pt/caregiver education to be provided about ongoing ROM needs  Time 12    Period Weeks     Status New     OT LONG TERM GOAL #2   Title Pt.  Will improve BUE strength to be able to sustain her UE's in elevation to efficiently, and effectively perform hair care, and self-grooming tasks.   Baseline 09/14/2021:  BUE shoulder strength: 3+/5 for shoulder flexion, and abduction.   Time 12    Period Weeks    Status New               OT LONG TERM GOAL #3   Title Patient will demonstrate improved composite finger flexion to hold deodorant to apply to underarms.    Baseline 01/12/2021: Pt. Presents with limited digit extension. Pt. Is able to initiate holding deodorant, however is unable to hold it while using it.   Time 12    Period Weeks    Status New   Target Date                  OT LONG TERM GOAL #4   Title Pt. Will improve bilateral wrist extension in preparation for anticipating, and initiating reaching for objects at the table.    Baseline 09/14/2021:  Right: 17(35), left 2(15)   Time    Period    Status    Target Date      OT LONG TERM GOAL #5   Title Pt. will write, and sign her name with 100% legibility, and modified independence.    Baseline 09/14/2021: Pt. continues to fill out her daily menu and complete puzzles. Pt. Continues to present with limited writing legibility.   Time 12    Period Weeks    Status  New          OT LONG TERM GOAL  #6   TITLE Pt. will turn pages in a book with modified independence.    Baseline 09/14/2021: Pt. has difficulty turning pages.    Time 12    Period Weeks    Status New                  Plan - 07/22/21 1737     Clinical Impression Statement Pt. tolerated Manual therapy, and UE ther. Ex well today without reports of pain following moist heat modality to the bilateral shoulders, and hands. Pt. Presents with tightness at the bilateral thumb webspace, and bilateral wrist flexor tightness,  as well as digit MP, PIP, and DIP extensor tightness. Pt. continues to benefit from working on impoving ROM in order to work towards  increasing engagement of her bilateral hands during ADLs, and IADL tasks.      OT Occupational Profile and History Detailed Assessment- Review of Records and additional review of physical, cognitive, psychosocial history related to current functional performance    Occupational performance deficits (Please refer to evaluation for details): ADL's;IADL's;Leisure;Social Participation    Body Structure / Function / Physical Skills ADL;Continence;Dexterity;Flexibility;Strength;ROM;Balance;Coordination;FMC;IADL;Endurance;UE functional use;Decreased knowledge of use of DME;GMC    Psychosocial Skills Coping Strategies    Rehab Potential Fair    Clinical Decision Making Several treatment options, min-mod task modification necessary    Comorbidities Affecting Occupational Performance: Presence of comorbidities impacting occupational performance    Comorbidities impacting occupational performance description: contractures of bilateral hands, dependent transfers,    Modification or Assistance to Complete Evaluation  No modification of tasks or assist necessary to complete eval    OT Frequency 2x / week    OT Duration 12 weeks  OT Treatment/Interventions Self-care/ADL training;Cryotherapy;Therapeutic exercise;DME and/or AE instruction;Balance training;Neuromuscular education;Manual Therapy;Splinting;Moist Heat;Passive range of motion;Therapeutic activities;Patient/family education    Plan continue to progress ROM of digits, wrists and shoulders as it pertains to completion of ADL tasks that pt values.    Consulted and Agree with Plan of Care Patient            Harrel Carina, MS, OTR/L  Harrel Carina, OT 09/21/2021, 12:12 PM

## 2021-09-23 ENCOUNTER — Ambulatory Visit: Payer: Medicare HMO | Admitting: Occupational Therapy

## 2021-09-23 ENCOUNTER — Encounter: Payer: Self-pay | Admitting: Occupational Therapy

## 2021-09-23 ENCOUNTER — Ambulatory Visit: Payer: Medicare HMO

## 2021-09-23 DIAGNOSIS — M6281 Muscle weakness (generalized): Secondary | ICD-10-CM

## 2021-09-23 NOTE — Therapy (Signed)
OUTPATIENT OCCUPATIONAL THERAPY NOTE   Patient Name: Sherry [name] MRN: [id number] DOB:[birth year], 87 y.o., female Today's Date: 09/23/2021                                                                                                                                                         PCP: Dr. Viviana Simpler REFERRING PROVIDER: Dr. Viviana Simpler   OT End of Session - 09/23/21 1153     Visit Number 4    Number of Visits 24    Date for OT Re-Evaluation 12/07/21    Authorization Time Period Progress report period starting 09/14/2021    OT Start Time 21    OT Stop Time 1145    OT Time Calculation (min) 45 min    Equipment Utilized During Treatment moist heat    Activity Tolerance Patient tolerated treatment well    Behavior During Therapy WFL for tasks assessed/performed             Past Medical History:  Diagnosis Date   Acute blood loss anemia    Arthritis    Cancer ([place])    skin   Central cord syndrome at C6 level of cervical spinal cord ([place]) 11/29/2017   Hypertension    Protein-calorie malnutrition, severe ([place]) 01/24/2018   S/P insertion of IVC (inferior vena caval) filter 01/24/2018   Tetraparesis ([place])    Past Surgical History:  Procedure Laterality Date   ANTERIOR CERVICAL DECOMP/DISCECTOMY FUSION N/A 11/29/2017   Procedure: Cervical five-six, six-seven Anterior Cervical Decompression Fusion;  Surgeon: Judith Part, MD;  Location: [place];  Service: Neurosurgery;  Laterality: N/A;  Cervical five-six, six-seven Anterior Cervical Decompression Fusion   CATARACT EXTRACTION     FEMUR IM NAIL Left 08/16/2021   Procedure: INTRAMEDULLARY (IM) NAIL FEMORAL;  Surgeon: Earnestine Leys, MD;  Location: [facility];  Service: Orthopedics;  Laterality: Left;   IR IVC FILTER PLMT / S&I /IMG GUID/MOD SED  12/08/2017   Patient Active Problem List   Diagnosis Date Noted   Closed hip fracture ([place]) 08/15/2021   DVT (deep venous thrombosis) ([place]) 08/15/2021    Quadriplegia ([place]) 08/15/2021   Fall 08/15/2021   Acute appendicitis with appendiceal abscess 05/30/2018   Hypocalcemia 02/15/2018   Dysuria 02/15/2018   Bilateral lower extremity edema 02/11/2018   Vaginal yeast infection 01/30/2018   Chest tightness 01/27/2018   Healthcare-associated pneumonia 01/25/2018   Pleural effusion, not elsewhere classified 01/25/2018   Acute deep vein thrombosis (DVT) of left lower extremity ([place]) 01/24/2018   Acute deep vein thrombosis (DVT) of right lower extremity ([place]) 01/24/2018   Chronic allergic rhinitis 01/24/2018   Depression with anxiety 01/24/2018   UTI due to Klebsiella species 01/24/2018   Protein-calorie malnutrition, severe ([place]) 01/24/2018   S/P insertion of IVC (inferior vena caval) filter 01/24/2018  Reactive depression    Hypokalemia    Leukocytosis    Essential hypertension    Trauma    Tetraparesis ([facility])    Neuropathic pain    Neurogenic bowel    Neurogenic bladder    Benign essential HTN    Acute postoperative anemia due to expected blood loss    Central cord syndrome at C6 level of cervical spinal cord ([place]) 11/29/2017   Central cord syndrome ([place]) 11/29/2017   SCC (squamous cell carcinoma) 04/08/2014      REFERRING DIAG: Central Cord Syndrome at C6 of the Cervical Spinal Cord, Fall with Bilateral Closed Hip Fractures, with ORIF repair with Intramedullary Nailing of the Left Hip Fracture.  THERAPY DIAG:  Muscle weakness (generalized)  Rationale for Evaluation and Treatment Rehabilitation  PERTINENT HISTORY: Patient s/p fall November 29, 2017 resulting in diagnosis of central cord syndrome at C 6 level.  She has had therapy in multiple venues but with recent move to [facility], therapy staff recommended she seek outpatient therapy for her needs. Pt. Had a recent fall with Bilateral Closed Hip Fractures, with ORIF repair with Intramedullary Nailing of the Left Hip Fracture.   PRECAUTIONS: None  SUBJECTIVE:   Pt. Reports  having had a cramping in her leg last night.  PAIN:  Are you having pain? No     OBJECTIVE:   TODAY'S TREATMENT:    Manual Therapy:  Pt. tolerated soft tissue massage to the volar, and dorsal surface of each digit on the bilateral hands secondary to stiffness. Manual therapy was performed independent of, and in preparation for therapeutic Ex.  Therapeutic Ex.   Pt. tolerated PROM followed by AROM bilateral wrist extension, PROM for bilateral digit MP, PIP, and DIP flexion, and extension, thumb radial, and palmar abduction in conjunction with moist heat. Constant monitoring was provided. BUE ROM, and strengthening was performed in preparation for functional reaching, and functional hand use.    Pt. tolerated Manual therapy, and UE there. Ex well today without reports of pain following moist heat modality to the bilateral shoulders, and hands. Pt. Presents with tightness at the bilateral thumb webspace, and bilateral wrist flexor tightness,  as well as digit MP, PIP, and DIP extensor tightness. Pt. continues to benefit from working on impoving ROM in order to work towards increasing engagement of her bilateral hands during ADLs, and IADL tasks.         PATIENT EDUCATION: Education details: BUE ROM, hand function, UE functioning Person educated: Patient Education method: Explanation and Demonstration Education comprehension: verbalized understanding, returned demonstration, and needs further education   HOME EXERCISE PROGRAM Continue with ongoing HEPs     OT Long Term Goals - 07/06/21 1106                                                                                          Target Date: 12/07/2021    OT LONG TERM GOAL #1   Title Patient and caregiver will demonstrate understanding of home exercise program for ROM.    Baseline 09/14/2021:  Pt. Reports inconsistencies with restorative ROM at  her care home. Pt/caregiver education to be provided about  ongoing ROM needs    Time  12    Period Weeks    Status New     OT LONG TERM GOAL #2   Title Pt.  Will improve BUE strength to be able to sustain her UE's in elevation to efficiently, and effectively perform hair care, and self-grooming tasks.   Baseline 09/14/2021:  BUE shoulder strength: 3+/5 for shoulder flexion, and abduction.   Time 12    Period Weeks    Status New               OT LONG TERM GOAL #3   Title Patient will demonstrate improved composite finger flexion to hold deodorant to apply to underarms.    Baseline 01/12/2021: Pt. Presents with limited digit extension. Pt. Is able to initiate holding deodorant, however is unable to hold it while using it.   Time 12    Period Weeks    Status New   Target Date                  OT LONG TERM GOAL #4   Title Pt. Will improve bilateral wrist extension in preparation for anticipating, and initiating reaching for objects at the table.    Baseline 09/14/2021:  Right: 17(35), left 2(15)   Time    Period    Status    Target Date      OT LONG TERM GOAL #5   Title Pt. will write, and sign her name with 100% legibility, and modified independence.    Baseline 09/14/2021: Pt. continues to fill out her daily menu and complete puzzles. Pt. Continues to present with limited writing legibility.   Time 12    Period Weeks    Status  New          OT LONG TERM GOAL  #6   TITLE Pt. will turn pages in a book with modified independence.    Baseline 09/14/2021: Pt. has difficulty turning pages.    Time 12    Period Weeks    Status New                  Plan - 07/22/21 1737     Clinical Impression Statement Pt. tolerated Manual therapy, and UE there. Ex well today without reports of pain following moist heat modality to the bilateral shoulders, and hands. Pt. Presents with tightness at the bilateral thumb webspace, and bilateral wrist flexor tightness,  as well as digit MP, PIP, and DIP extensor tightness. Pt. continues to benefit from working on impoving ROM in  order to work towards increasing engagement of her bilateral hands during ADLs, and IADL tasks.        OT Occupational Profile and History Detailed Assessment- Review of Records and additional review of physical, cognitive, psychosocial history related to current functional performance    Occupational performance deficits (Please refer to evaluation for details): ADL's;IADL's;Leisure;Social Participation    Body Structure / Function / Physical Skills ADL;Continence;Dexterity;Flexibility;Strength;ROM;Balance;Coordination;FMC;IADL;Endurance;UE functional use;Decreased knowledge of use of DME;GMC    Psychosocial Skills Coping Strategies    Rehab Potential Fair    Clinical Decision Making Several treatment options, min-mod task modification necessary    Comorbidities Affecting Occupational Performance: Presence of comorbidities impacting occupational performance    Comorbidities impacting occupational performance description: contractures of bilateral hands, dependent transfers,    Modification or Assistance to Complete Evaluation  No modification of tasks or assist necessary to complete eval    OT Frequency 2x / week  OT Duration 12 weeks    OT Treatment/Interventions Self-care/ADL training;Cryotherapy;Therapeutic exercise;DME and/or AE instruction;Balance training;Neuromuscular education;Manual Therapy;Splinting;Moist Heat;Passive range of motion;Therapeutic activities;Patient/family education    Plan continue to progress ROM of digits, wrists and shoulders as it pertains to completion of ADL tasks that pt values.    Consulted and Agree with Plan of Care Patient            Harrel Carina, MS, OTR/L  Harrel Carina, OT 09/23/2021, 12:05 PM

## 2021-09-24 LAB — BASIC METABOLIC PANEL
BUN: 11 (ref 4–21)
CO2: 30 — AB (ref 13–22)
Chloride: 101 (ref 99–108)
Creatinine: 0.5 (ref 0.5–1.1)
Glucose: 88
Potassium: 4.7 mEq/L (ref 3.5–5.1)
Sodium: 137 (ref 137–147)

## 2021-09-24 LAB — COMPREHENSIVE METABOLIC PANEL
Albumin: 3.2 — AB (ref 3.5–5.0)
Calcium: 8.5 — AB (ref 8.7–10.7)
eGFR: 91

## 2021-09-28 ENCOUNTER — Ambulatory Visit: Payer: Medicare HMO | Admitting: Occupational Therapy

## 2021-09-28 ENCOUNTER — Ambulatory Visit: Payer: Medicare HMO

## 2021-09-28 DIAGNOSIS — M6281 Muscle weakness (generalized): Secondary | ICD-10-CM

## 2021-09-28 NOTE — Therapy (Signed)
OUTPATIENT OCCUPATIONAL THERAPY NOTE   Patient Name: Sherry [name] MRN: [id number] DOB:1934-05-16, 87 y.o., female Today's Date: 09/28/2021                                                                                                                                                         PCP: Dr. Viviana Simpler REFERRING PROVIDER: Dr. Viviana Simpler   OT End of Session - 09/28/21 1646     Visit Number 5    Number of Visits 24    Date for OT Re-Evaluation 12/07/21    Authorization Time Period Progress report period starting 09/14/2021    OT Start Time 1145    OT Stop Time 1230    OT Time Calculation (min) 45 min    Activity Tolerance Patient tolerated treatment well    Behavior During Therapy [facility] for tasks assessed/performed             Past Medical History:  Diagnosis Date   Acute blood loss anemia    Arthritis    Cancer ([place])    skin   Central cord syndrome at C6 level of cervical spinal cord ([place]) 11/29/2017   Hypertension    Protein-calorie malnutrition, severe ([place]) 01/24/2018   S/P insertion of IVC (inferior vena caval) filter 01/24/2018   Tetraparesis ([place])    Past Surgical History:  Procedure Laterality Date   ANTERIOR CERVICAL DECOMP/DISCECTOMY FUSION N/A 11/29/2017   Procedure: Cervical five-six, six-seven Anterior Cervical Decompression Fusion;  Surgeon: Judith Part, MD;  Location: [place];  Service: Neurosurgery;  Laterality: N/A;  Cervical five-six, six-seven Anterior Cervical Decompression Fusion   CATARACT EXTRACTION     FEMUR IM NAIL Left 08/16/2021   Procedure: INTRAMEDULLARY (IM) NAIL FEMORAL;  Surgeon: Earnestine Leys, MD;  Location: [facility];  Service: Orthopedics;  Laterality: Left;   IR IVC FILTER PLMT / S&I /IMG GUID/MOD SED  12/08/2017   Patient Active Problem List   Diagnosis Date Noted   Closed hip fracture ([place]) 08/15/2021   DVT (deep venous thrombosis) ([place]) 08/15/2021   Quadriplegia ([place]) 08/15/2021   Fall 08/15/2021   Acute  appendicitis with appendiceal abscess 05/30/2018   Hypocalcemia 02/15/2018   Dysuria 02/15/2018   Bilateral lower extremity edema 02/11/2018   Vaginal yeast infection 01/30/2018   Chest tightness 01/27/2018   Healthcare-associated pneumonia 01/25/2018   Pleural effusion, not elsewhere classified 01/25/2018   Acute deep vein thrombosis (DVT) of left lower extremity ([place]) 01/24/2018   Acute deep vein thrombosis (DVT) of right lower extremity ([place]) 01/24/2018   Chronic allergic rhinitis 01/24/2018   Depression with anxiety 01/24/2018   UTI due to Klebsiella species 01/24/2018   Protein-calorie malnutrition, severe ([place]) 01/24/2018   S/P insertion of IVC (inferior vena caval) filter 01/24/2018   Reactive depression    Hypokalemia  Leukocytosis    Essential hypertension    Trauma    Tetraparesis ([facility])    Neuropathic pain    Neurogenic bowel    Neurogenic bladder    Benign essential HTN    Acute postoperative anemia due to expected blood loss    Central cord syndrome at C6 level of cervical spinal cord ([place]) 11/29/2017   Central cord syndrome ([place]) 11/29/2017   SCC (squamous cell carcinoma) 04/08/2014      REFERRING DIAG: Central Cord Syndrome at C6 of the Cervical Spinal Cord, Fall with Bilateral Closed Hip Fractures, with ORIF repair with Intramedullary Nailing of the Left Hip Fracture.  THERAPY DIAG:  Muscle weakness (generalized)  Rationale for Evaluation and Treatment Rehabilitation  PERTINENT HISTORY: Patient s/p fall November 29, 2017 resulting in diagnosis of central cord syndrome at C 6 level.  She has had therapy in multiple venues but with recent move to [facility], therapy staff recommended she seek outpatient therapy for her needs. Pt. Had a recent fall with Bilateral Closed Hip Fractures, with ORIF repair with Intramedullary Nailing of the Left Hip Fracture.   PRECAUTIONS: None  SUBJECTIVE:   Pt. reports having had a cramping in her leg last night.  PAIN:  Are  you having pain? No     OBJECTIVE:   TODAY'S TREATMENT:    Manual Therapy:  Pt. tolerated soft tissue massage to the volar, and dorsal surface of each digit on the bilateral hands secondary to stiffness. Manual therapy was performed independent of, and in preparation for therapeutic Ex.  Therapeutic Ex.   Pt. worked on [employer], and reciprocal motion using the UBE while seated for 8 min. with minimal resistance. Constant monitoring was provided. Pt. tolerated PROM followed by AROM bilateral wrist extension, PROM for bilateral digit MP, PIP, and DIP flexion, and extension, thumb radial, and palmar abduction in conjunction with moist heat. Constant monitoring was provided. BUE ROM, and strengthening was performed in preparation for functional reaching, and functional hand use.    Pt. tolerated manual therapy, and UE there. ex well today without reports of pain following moist heat modality to the bilateral shoulders, and hands. Pt. presents with tightness at the bilateral thumb webspace, and bilateral wrist flexor tightness,  as well as digit MP, PIP, and DIP extensor tightness. Pt. continues to benefit from working on impoving ROM in order to work towards increasing engagement of her bilateral hands during ADLs, and IADL tasks.         PATIENT EDUCATION: Education details: BUE ROM, hand function, UE functioning Person educated: Patient Education method: Explanation and Demonstration Education comprehension: verbalized understanding, returned demonstration, and needs further education   HOME EXERCISE PROGRAM Continue with ongoing HEPs     OT Long Term Goals - 07/06/21 1106                                                                                          Target Date: 12/07/2021    OT LONG TERM GOAL #1   Title Patient and caregiver will demonstrate understanding of home exercise program for ROM.    Baseline 09/14/2021:  Pt. Reports inconsistencies  with restorative  ROM at  her care home. Pt/caregiver education to be provided about ongoing ROM needs    Time 12    Period Weeks    Status New     OT LONG TERM GOAL #2   Title Pt.  Will improve BUE strength to be able to sustain her UE's in elevation to efficiently, and effectively perform hair care, and self-grooming tasks.   Baseline 09/14/2021:  BUE shoulder strength: 3+/5 for shoulder flexion, and abduction.   Time 12    Period Weeks    Status New               OT LONG TERM GOAL #3   Title Patient will demonstrate improved composite finger flexion to hold deodorant to apply to underarms.    Baseline 01/12/2021: Pt. Presents with limited digit extension. Pt. Is able to initiate holding deodorant, however is unable to hold it while using it.   Time 12    Period Weeks    Status New   Target Date                  OT LONG TERM GOAL #4   Title Pt. Will improve bilateral wrist extension in preparation for anticipating, and initiating reaching for objects at the table.    Baseline 09/14/2021:  Right: 17(35), left 2(15)   Time    Period    Status    Target Date      OT LONG TERM GOAL #5   Title Pt. will write, and sign her name with 100% legibility, and modified independence.    Baseline 09/14/2021: Pt. continues to fill out her daily menu and complete puzzles. Pt. Continues to present with limited writing legibility.   Time 12    Period Weeks    Status  New          OT LONG TERM GOAL  #6   TITLE Pt. will turn pages in a book with modified independence.    Baseline 09/14/2021: Pt. has difficulty turning pages.    Time 12    Period Weeks    Status New                  Plan - 07/22/21 1737     Clinical Impression Statement Pt. tolerated manual therapy, and UE there. ex well today without reports of pain following moist heat modality to the bilateral shoulders, and hands. Pt. presents with tightness at the bilateral thumb webspace, and bilateral wrist flexor tightness,  as well as digit  MP, PIP, and DIP extensor tightness. Pt. continues to benefit from working on impoving ROM in order to work towards increasing engagement of her bilateral hands during ADLs, and IADL tasks.          OT Occupational Profile and History Detailed Assessment- Review of Records and additional review of physical, cognitive, psychosocial history related to current functional performance    Occupational performance deficits (Please refer to evaluation for details): ADL's;IADL's;Leisure;Social Participation    Body Structure / Function / Physical Skills ADL;Continence;Dexterity;Flexibility;Strength;ROM;Balance;Coordination;FMC;IADL;Endurance;UE functional use;Decreased knowledge of use of DME;GMC    Psychosocial Skills Coping Strategies    Rehab Potential Fair    Clinical Decision Making Several treatment options, min-mod task modification necessary    Comorbidities Affecting Occupational Performance: Presence of comorbidities impacting occupational performance    Comorbidities impacting occupational performance description: contractures of bilateral hands, dependent transfers,    Modification or Assistance to Complete Evaluation  No modification of tasks  or assist necessary to complete eval    OT Frequency 2x / week    OT Duration 12 weeks    OT Treatment/Interventions Self-care/ADL training;Cryotherapy;Therapeutic exercise;DME and/or AE instruction;Balance training;Neuromuscular education;Manual Therapy;Splinting;Moist Heat;Passive range of motion;Therapeutic activities;Patient/family education    Plan continue to progress ROM of digits, wrists and shoulders as it pertains to completion of ADL tasks that pt values.    Consulted and Agree with Plan of Care Patient            Harrel Carina, MS, OTR/L  Harrel Carina, OT 09/28/2021, 4:51 PM

## 2021-09-30 ENCOUNTER — Ambulatory Visit: Payer: Medicare HMO | Admitting: Occupational Therapy

## 2021-09-30 ENCOUNTER — Encounter: Payer: Self-pay | Admitting: Occupational Therapy

## 2021-09-30 ENCOUNTER — Ambulatory Visit: Payer: Medicare HMO

## 2021-09-30 DIAGNOSIS — M6281 Muscle weakness (generalized): Secondary | ICD-10-CM | POA: Diagnosis not present

## 2021-09-30 DIAGNOSIS — R278 Other lack of coordination: Secondary | ICD-10-CM

## 2021-09-30 NOTE — Therapy (Signed)
OUTPATIENT OCCUPATIONAL THERAPY NOTE   Patient Name: Sherry [name] MRN: [id number] DOB:11/19/1934, 87 y.o., female Today's Date: 09/30/2021                                                                                                                                                         PCP: Dr. Viviana Simpler REFERRING PROVIDER: Dr. Viviana Simpler   OT End of Session - 09/30/21 1233     Visit Number 6    Number of Visits 24    Date for OT Re-Evaluation 12/07/21    Authorization Time Period Progress report period starting 09/14/2021    OT Start Time 1100    OT Stop Time 1145    OT Time Calculation (min) 45 min    Activity Tolerance Patient tolerated treatment well    Behavior During Therapy [facility] for tasks assessed/performed             Past Medical History:  Diagnosis Date   Acute blood loss anemia    Arthritis    Cancer ([place])    skin   Central cord syndrome at C6 level of cervical spinal cord ([place]) 11/29/2017   Hypertension    Protein-calorie malnutrition, severe ([place]) 01/24/2018   S/P insertion of IVC (inferior vena caval) filter 01/24/2018   Tetraparesis ([place])    Past Surgical History:  Procedure Laterality Date   ANTERIOR CERVICAL DECOMP/DISCECTOMY FUSION N/A 11/29/2017   Procedure: Cervical five-six, six-seven Anterior Cervical Decompression Fusion;  Surgeon: Judith Part, MD;  Location: [place];  Service: Neurosurgery;  Laterality: N/A;  Cervical five-six, six-seven Anterior Cervical Decompression Fusion   CATARACT EXTRACTION     FEMUR IM NAIL Left 08/16/2021   Procedure: INTRAMEDULLARY (IM) NAIL FEMORAL;  Surgeon: Earnestine Leys, MD;  Location: [facility];  Service: Orthopedics;  Laterality: Left;   IR IVC FILTER PLMT / S&I /IMG GUID/MOD SED  12/08/2017   Patient Active Problem List   Diagnosis Date Noted   Closed hip fracture ([place]) 08/15/2021   DVT (deep venous thrombosis) ([place]) 08/15/2021   Quadriplegia ([place]) 08/15/2021   Fall 08/15/2021   Acute  appendicitis with appendiceal abscess 05/30/2018   Hypocalcemia 02/15/2018   Dysuria 02/15/2018   Bilateral lower extremity edema 02/11/2018   Vaginal yeast infection 01/30/2018   Chest tightness 01/27/2018   Healthcare-associated pneumonia 01/25/2018   Pleural effusion, not elsewhere classified 01/25/2018   Acute deep vein thrombosis (DVT) of left lower extremity ([place]) 01/24/2018   Acute deep vein thrombosis (DVT) of right lower extremity ([place]) 01/24/2018   Chronic allergic rhinitis 01/24/2018   Depression with anxiety 01/24/2018   UTI due to Klebsiella species 01/24/2018   Protein-calorie malnutrition, severe ([place]) 01/24/2018   S/P insertion of IVC (inferior vena caval) filter 01/24/2018   Reactive depression    Hypokalemia  Leukocytosis    Essential hypertension    Trauma    Tetraparesis (HCC)    Neuropathic pain    Neurogenic bowel    Neurogenic bladder    Benign essential HTN    Acute postoperative anemia due to expected blood loss    Central cord syndrome at C6 level of cervical spinal cord ([place]) 11/29/2017   Central cord syndrome ([place]) 11/29/2017   SCC (squamous cell carcinoma) 04/08/2014      REFERRING DIAG: Central Cord Syndrome at C6 of the Cervical Spinal Cord, Fall with Bilateral Closed Hip Fractures, with ORIF repair with Intramedullary Nailing of the Left Hip Fracture.  THERAPY DIAG:  Muscle weakness (generalized)  Other lack of coordination  Rationale for Evaluation and Treatment Rehabilitation  PERTINENT HISTORY: Patient s/p fall November 29, 2017 resulting in diagnosis of central cord syndrome at C 6 level.  She has had therapy in multiple venues but with recent move to [facility], therapy staff recommended she seek outpatient therapy for her needs. Pt. Had a recent fall with Bilateral Closed Hip Fractures, with ORIF repair with Intramedullary Nailing of the Left Hip Fracture.   PRECAUTIONS: None  SUBJECTIVE:   Pt. reports having had a cramping in her  leg last night.  PAIN:  Are you having pain? No     OBJECTIVE:   TODAY'S TREATMENT:    Manual Therapy:  Pt. tolerated soft tissue massage to the volar, and dorsal surface of each digit on the bilateral hands secondary to stiffness. Manual therapy was performed independent of, and in preparation for therapeutic Ex.  Therapeutic Ex.   Pt. worked on [employer], and reciprocal motion using the UBE while seated for 8 min. with minimal resistance. Constant monitoring was provided. Pt. tolerated PROM followed by AROM bilateral wrist extension, PROM for bilateral digit MP, PIP, and DIP flexion, and extension, thumb radial, and palmar abduction in conjunction with moist heat. Constant monitoring was provided. Pt. performed 1.5# dowel ex. For UE strengthening secondary to weakness. Bilateral shoulder flexion, and chest press. Pt. Worked on bilateral reaching for a target using a ball is all planes. BUE ROM, and strengthening was performed in preparation for functional reaching, and functional hand use.    Pt. tolerated manual therapy, and UE there. ex well today without reports of pain following moist heat modality to the bilateral shoulders, and hands. Pt. presents with tightness at the bilateral thumb webspace, and bilateral wrist flexor tightness,  as well as digit MP, PIP, and DIP extensor tightness. Pt. Required rest breaks with the dowel, and reaching tasks. Pt. continues to benefit from working on impoving ROM in order to work towards increasing engagement of her bilateral hands during ADLs, and IADL tasks.         PATIENT EDUCATION: Education details: BUE ROM, hand function, UE functioning Person educated: Patient Education method: Explanation and Demonstration Education comprehension: verbalized understanding, returned demonstration, and needs further education   HOME EXERCISE PROGRAM Continue with ongoing HEPs     OT Long Term Goals - 07/06/21 1106  Target Date: 12/07/2021    OT LONG TERM GOAL #1   Title Patient and caregiver will demonstrate understanding of home exercise program for ROM.    Baseline 09/14/2021:  Pt. Reports inconsistencies with restorative ROM at  her care home. Pt/caregiver education to be provided about ongoing ROM needs    Time 12    Period Weeks    Status New     OT LONG TERM GOAL #2   Title Pt.  Will improve BUE strength to be able to sustain her UE's in elevation to efficiently, and effectively perform hair care, and self-grooming tasks.   Baseline 09/14/2021:  BUE shoulder strength: 3+/5 for shoulder flexion, and abduction.   Time 12    Period Weeks    Status New               OT LONG TERM GOAL #3   Title Patient will demonstrate improved composite finger flexion to hold deodorant to apply to underarms.    Baseline 01/12/2021: Pt. Presents with limited digit extension. Pt. Is able to initiate holding deodorant, however is unable to hold it while using it.   Time 12    Period Weeks    Status New   Target Date                  OT LONG TERM GOAL #4   Title Pt. Will improve bilateral wrist extension in preparation for anticipating, and initiating reaching for objects at the table.    Baseline 09/14/2021:  Right: 17(35), left 2(15)   Time    Period    Status    Target Date      OT LONG TERM GOAL #5   Title Pt. will write, and sign her name with 100% legibility, and modified independence.    Baseline 09/14/2021: Pt. continues to fill out her daily menu and complete puzzles. Pt. Continues to present with limited writing legibility.   Time 12    Period Weeks    Status  New          OT LONG TERM GOAL  #6   TITLE Pt. will turn pages in a book with modified independence.    Baseline 09/14/2021: Pt. has difficulty turning pages.    Time 12    Period Weeks    Status New                  Plan - 07/22/21 1737     Clinical Impression  Statement Pt. tolerated manual therapy, and UE there. ex well today without reports of pain following moist heat modality to the bilateral shoulders, and hands. Pt. presents with tightness at the bilateral thumb webspace, and bilateral wrist flexor tightness,  as well as digit MP, PIP, and DIP extensor tightness. Pt. Required rest breaks with the dowel, and reaching tasks. Pt. continues to benefit from working on impoving ROM in order to work towards increasing engagement of her bilateral hands during ADLs, and IADL tasks.            OT Occupational Profile and History Detailed Assessment- Review of Records and additional review of physical, cognitive, psychosocial history related to current functional performance    Occupational performance deficits (Please refer to evaluation for details): ADL's;IADL's;Leisure;Social Participation    Body Structure / Function / Physical Skills ADL;Continence;Dexterity;Flexibility;Strength;ROM;Balance;Coordination;FMC;IADL;Endurance;UE functional use;Decreased knowledge of use of DME;GMC    Psychosocial Skills Coping Strategies    Rehab Potential Fair    Clinical Decision Making Several treatment  options, min-mod task modification necessary    Comorbidities Affecting Occupational Performance: Presence of comorbidities impacting occupational performance    Comorbidities impacting occupational performance description: contractures of bilateral hands, dependent transfers,    Modification or Assistance to Complete Evaluation  No modification of tasks or assist necessary to complete eval    OT Frequency 2x / week    OT Duration 12 weeks    OT Treatment/Interventions Self-care/ADL training;Cryotherapy;Therapeutic exercise;DME and/or AE instruction;Balance training;Neuromuscular education;Manual Therapy;Splinting;Moist Heat;Passive range of motion;Therapeutic activities;Patient/family education    Plan continue to progress ROM of digits, wrists and shoulders as it  pertains to completion of ADL tasks that pt values.    Consulted and Agree with Plan of Care Patient            Harrel Carina, MS, OTR/L  Harrel Carina, OT 09/30/2021, 12:35 PM

## 2021-10-07 ENCOUNTER — Encounter: Payer: Medicare HMO | Admitting: Occupational Therapy

## 2021-10-07 DIAGNOSIS — S72141A Displaced intertrochanteric fracture of right femur, initial encounter for closed fracture: Secondary | ICD-10-CM | POA: Insufficient documentation

## 2021-10-07 DIAGNOSIS — S7222XA Displaced subtrochanteric fracture of left femur, initial encounter for closed fracture: Secondary | ICD-10-CM | POA: Insufficient documentation

## 2021-10-08 DIAGNOSIS — I82409 Acute embolism and thrombosis of unspecified deep veins of unspecified lower extremity: Secondary | ICD-10-CM

## 2021-10-08 DIAGNOSIS — S79001A Unspecified physeal fracture of upper end of right femur, initial encounter for closed fracture: Secondary | ICD-10-CM

## 2021-10-08 DIAGNOSIS — I10 Essential (primary) hypertension: Secondary | ICD-10-CM

## 2021-10-08 DIAGNOSIS — F39 Unspecified mood [affective] disorder: Secondary | ICD-10-CM

## 2021-10-08 DIAGNOSIS — G8250 Quadriplegia, unspecified: Secondary | ICD-10-CM

## 2021-10-08 DIAGNOSIS — M80051A Age-related osteoporosis with current pathological fracture, right femur, initial encounter for fracture: Secondary | ICD-10-CM

## 2021-10-12 ENCOUNTER — Ambulatory Visit: Payer: Medicare HMO | Attending: Internal Medicine | Admitting: Occupational Therapy

## 2021-10-12 DIAGNOSIS — R262 Difficulty in walking, not elsewhere classified: Secondary | ICD-10-CM | POA: Diagnosis present

## 2021-10-12 DIAGNOSIS — S14129S Central cord syndrome at unspecified level of cervical spinal cord, sequela: Secondary | ICD-10-CM | POA: Insufficient documentation

## 2021-10-12 DIAGNOSIS — S72002A Fracture of unspecified part of neck of left femur, initial encounter for closed fracture: Secondary | ICD-10-CM | POA: Diagnosis present

## 2021-10-12 DIAGNOSIS — R2681 Unsteadiness on feet: Secondary | ICD-10-CM | POA: Diagnosis present

## 2021-10-12 DIAGNOSIS — M6281 Muscle weakness (generalized): Secondary | ICD-10-CM | POA: Insufficient documentation

## 2021-10-12 DIAGNOSIS — S72001A Fracture of unspecified part of neck of right femur, initial encounter for closed fracture: Secondary | ICD-10-CM | POA: Insufficient documentation

## 2021-10-12 DIAGNOSIS — R2689 Other abnormalities of gait and mobility: Secondary | ICD-10-CM | POA: Insufficient documentation

## 2021-10-12 DIAGNOSIS — R278 Other lack of coordination: Secondary | ICD-10-CM | POA: Insufficient documentation

## 2021-10-12 DIAGNOSIS — R269 Unspecified abnormalities of gait and mobility: Secondary | ICD-10-CM | POA: Diagnosis present

## 2021-10-13 ENCOUNTER — Encounter: Payer: Self-pay | Admitting: Occupational Therapy

## 2021-10-13 NOTE — Therapy (Signed)
OUTPATIENT OCCUPATIONAL THERAPY NOTE   Patient Name: Sherry [name] MRN: [id number] DOB:Apr 25, 1934, 87 y.o., female Today's Date: 10/13/2021                                                                                                                                                         PCP: Dr. Viviana Simpler REFERRING PROVIDER: Dr. Viviana Simpler   OT End of Session - 10/13/21 0836     Visit Number 7    Number of Visits 24    Date for OT Re-Evaluation 12/07/21    Authorization Time Period Progress report period starting 09/14/2021    OT Start Time 5    OT Stop Time 1145    OT Time Calculation (min) 45 min    Equipment Utilized During Treatment moist heat    Activity Tolerance Patient tolerated treatment well    Behavior During Therapy WFL for tasks assessed/performed             Past Medical History:  Diagnosis Date   Acute blood loss anemia    Arthritis    Cancer ([place])    skin   Central cord syndrome at C6 level of cervical spinal cord ([place]) 11/29/2017   Hypertension    Protein-calorie malnutrition, severe ([place]) 01/24/2018   S/P insertion of IVC (inferior vena caval) filter 01/24/2018   Tetraparesis ([place])    Past Surgical History:  Procedure Laterality Date   ANTERIOR CERVICAL DECOMP/DISCECTOMY FUSION N/A 11/29/2017   Procedure: Cervical five-six, six-seven Anterior Cervical Decompression Fusion;  Surgeon: Judith Part, MD;  Location: [place];  Service: Neurosurgery;  Laterality: N/A;  Cervical five-six, six-seven Anterior Cervical Decompression Fusion   CATARACT EXTRACTION     FEMUR IM NAIL Left 08/16/2021   Procedure: INTRAMEDULLARY (IM) NAIL FEMORAL;  Surgeon: Earnestine Leys, MD;  Location: [facility];  Service: Orthopedics;  Laterality: Left;   IR IVC FILTER PLMT / S&I /IMG GUID/MOD SED  12/08/2017   Patient Active Problem List   Diagnosis Date Noted   Closed hip fracture ([place]) 08/15/2021   DVT (deep venous thrombosis) ([place]) 08/15/2021    Quadriplegia ([place]) 08/15/2021   Fall 08/15/2021   Acute appendicitis with appendiceal abscess 05/30/2018   Hypocalcemia 02/15/2018   Dysuria 02/15/2018   Bilateral lower extremity edema 02/11/2018   Vaginal yeast infection 01/30/2018   Chest tightness 01/27/2018   Healthcare-associated pneumonia 01/25/2018   Pleural effusion, not elsewhere classified 01/25/2018   Acute deep vein thrombosis (DVT) of left lower extremity ([place]) 01/24/2018   Acute deep vein thrombosis (DVT) of right lower extremity ([place]) 01/24/2018   Chronic allergic rhinitis 01/24/2018   Depression with anxiety 01/24/2018   UTI due to Klebsiella species 01/24/2018   Protein-calorie malnutrition, severe ([place]) 01/24/2018   S/P insertion of IVC (inferior vena caval) filter 01/24/2018  Reactive depression    Hypokalemia    Leukocytosis    Essential hypertension    Trauma    Tetraparesis (HCC)    Neuropathic pain    Neurogenic bowel    Neurogenic bladder    Benign essential HTN    Acute postoperative anemia due to expected blood loss    Central cord syndrome at C6 level of cervical spinal cord ([place]) 11/29/2017   Central cord syndrome ([place]) 11/29/2017   SCC (squamous cell carcinoma) 04/08/2014      REFERRING DIAG: Central Cord Syndrome at C6 of the Cervical Spinal Cord, Fall with Bilateral Closed Hip Fractures, with ORIF repair with Intramedullary Nailing of the Left Hip Fracture.  THERAPY DIAG:  Muscle weakness (generalized)  Rationale for Evaluation and Treatment Rehabilitation  PERTINENT HISTORY: Patient s/p fall November 29, 2017 resulting in diagnosis of central cord syndrome at C 6 level.  She has had therapy in multiple venues but with recent move to [facility], therapy staff recommended she seek outpatient therapy for her needs. Pt. Had a recent fall with Bilateral Closed Hip Fractures, with ORIF repair with Intramedullary Nailing of the Left Hip Fracture.   PRECAUTIONS: None  SUBJECTIVE:   Pt. reports  having had a cramping in her leg last night.  PAIN:  Are you having pain? No     OBJECTIVE:   TODAY'S TREATMENT:    Manual Therapy:  Pt. tolerated soft tissue massage to the volar, and dorsal surface of each digit on the bilateral hands secondary to stiffness. Manual therapy was performed independent of, and in preparation for therapeutic Ex.  Therapeutic Ex.   Pt. worked on [employer], and reciprocal motion using the UBE while seated for 6 min. with minimal resistance. Constant monitoring was provided. Pt. tolerated PROM followed by AROM bilateral wrist extension, PROM for bilateral digit MP, PIP, and DIP flexion, and extension, thumb radial, and palmar abduction in conjunction with moist heat. Constant monitoring was provided. BUE ROM, and strengthening was performed in preparation for functional reaching, and functional hand use.    Pt. had a follow-up MD visit last week. Pt. reports that she still has not been cleared to start LE exercises with PT until after she has her follow-up appointment next month. Pt. tolerated manual therapy, and UE there. ex well today without reports of pain following moist heat modality to the bilateral shoulders, and hands. Pt. presents with tightness at the bilateral thumb webspace, and bilateral wrist flexor tightness, as well as digit MP, PIP, and DIP extensor tightness which limits her ability to complete ADL tasks efficiently. Pt. continues to benefit from working on impoving ROM in order to work towards increasing engagement of her bilateral hands during ADLs, and IADL tasks.         PATIENT EDUCATION: Education details: BUE ROM, hand function, UE functioning Person educated: Patient Education method: Explanation and Demonstration Education comprehension: verbalized understanding, returned demonstration, and needs further education   HOME EXERCISE PROGRAM Continue with ongoing HEPs     OT Long Term Goals - 07/06/21 1106  Target Date: 12/07/2021    OT LONG TERM GOAL #1   Title Patient and caregiver will demonstrate understanding of home exercise program for ROM.    Baseline 09/14/2021:  Pt. Reports inconsistencies with restorative ROM at  her care home. Pt/caregiver education to be provided about ongoing ROM needs    Time 12    Period Weeks    Status New     OT LONG TERM GOAL #2   Title Pt.  Will improve BUE strength to be able to sustain her UE's in elevation to efficiently, and effectively perform hair care, and self-grooming tasks.   Baseline 09/14/2021:  BUE shoulder strength: 3+/5 for shoulder flexion, and abduction.   Time 12    Period Weeks    Status New               OT LONG TERM GOAL #3   Title Patient will demonstrate improved composite finger flexion to hold deodorant to apply to underarms.    Baseline 01/12/2021: Pt. Presents with limited digit extension. Pt. Is able to initiate holding deodorant, however is unable to hold it while using it.   Time 12    Period Weeks    Status New   Target Date                  OT LONG TERM GOAL #4   Title Pt. Will improve bilateral wrist extension in preparation for anticipating, and initiating reaching for objects at the table.    Baseline 09/14/2021:  Right: 17(35), left 2(15)   Time    Period    Status    Target Date      OT LONG TERM GOAL #5   Title Pt. will write, and sign her name with 100% legibility, and modified independence.    Baseline 09/14/2021: Pt. continues to fill out her daily menu and complete puzzles. Pt. Continues to present with limited writing legibility.   Time 12    Period Weeks    Status  New          OT LONG TERM GOAL  #6   TITLE Pt. will turn pages in a book with modified independence.    Baseline 09/14/2021: Pt. has difficulty turning pages.    Time 12    Period Weeks    Status New                  Plan - 07/22/21 1737     Clinical  Impression Statement Pt. had a follow-up MD visit last week. Pt. reports that she still has not been cleared to start LE exercises with PT until after she has her follow-up appointment next month. Pt. tolerated manual therapy, and UE there. ex well today without reports of pain following moist heat modality to the bilateral shoulders, and hands. Pt. presents with tightness at the bilateral thumb webspace, and bilateral wrist flexor tightness, as well as digit MP, PIP, and DIP extensor tightness which limits her ability to complete ADL tasks efficiently. Pt. continues to benefit from working on impoving ROM in order to work towards increasing engagement of her bilateral hands during ADLs, and IADL tasks.               OT Occupational Profile and History Detailed Assessment- Review of Records and additional review of physical, cognitive, psychosocial history related to current functional performance    Occupational performance deficits (Please refer to evaluation for details): ADL's;IADL's;Leisure;Social Participation    Body Structure /  Function / Physical Skills ADL;Continence;Dexterity;Flexibility;Strength;ROM;Balance;Coordination;FMC;IADL;Endurance;UE functional use;Decreased knowledge of use of DME;GMC    Psychosocial Skills Coping Strategies    Rehab Potential Fair    Clinical Decision Making Several treatment options, min-mod task modification necessary    Comorbidities Affecting Occupational Performance: Presence of comorbidities impacting occupational performance    Comorbidities impacting occupational performance description: contractures of bilateral hands, dependent transfers,    Modification or Assistance to Complete Evaluation  No modification of tasks or assist necessary to complete eval    OT Frequency 2x / week    OT Duration 12 weeks    OT Treatment/Interventions Self-care/ADL training;Cryotherapy;Therapeutic exercise;DME and/or AE instruction;Balance training;Neuromuscular  education;Manual Therapy;Splinting;Moist Heat;Passive range of motion;Therapeutic activities;Patient/family education    Plan continue to progress ROM of digits, wrists and shoulders as it pertains to completion of ADL tasks that pt values.    Consulted and Agree with Plan of Care Patient            Harrel Carina, MS, OTR/L  Harrel Carina, OT 10/13/2021, 8:39 AM

## 2021-10-14 ENCOUNTER — Ambulatory Visit: Payer: Medicare HMO

## 2021-10-14 ENCOUNTER — Ambulatory Visit: Payer: Medicare HMO | Admitting: Occupational Therapy

## 2021-10-19 ENCOUNTER — Encounter: Payer: Self-pay | Admitting: Occupational Therapy

## 2021-10-19 ENCOUNTER — Ambulatory Visit: Payer: Medicare HMO | Admitting: Occupational Therapy

## 2021-10-19 ENCOUNTER — Ambulatory Visit: Payer: Medicare HMO

## 2021-10-19 DIAGNOSIS — S14129S Central cord syndrome at unspecified level of cervical spinal cord, sequela: Secondary | ICD-10-CM

## 2021-10-19 DIAGNOSIS — M6281 Muscle weakness (generalized): Secondary | ICD-10-CM

## 2021-10-19 DIAGNOSIS — R2689 Other abnormalities of gait and mobility: Secondary | ICD-10-CM

## 2021-10-19 DIAGNOSIS — R269 Unspecified abnormalities of gait and mobility: Secondary | ICD-10-CM

## 2021-10-19 DIAGNOSIS — R262 Difficulty in walking, not elsewhere classified: Secondary | ICD-10-CM

## 2021-10-19 DIAGNOSIS — R2681 Unsteadiness on feet: Secondary | ICD-10-CM

## 2021-10-19 DIAGNOSIS — R278 Other lack of coordination: Secondary | ICD-10-CM

## 2021-10-19 DIAGNOSIS — S72001A Fracture of unspecified part of neck of right femur, initial encounter for closed fracture: Secondary | ICD-10-CM

## 2021-10-19 NOTE — Therapy (Signed)
OUTPATIENT OCCUPATIONAL THERAPY NOTE   Patient Name: Sherry [name] MRN: [id number] DOB:[birth year], 87 y.o., female Today's Date: 10/19/2021                                                                                                                                                         PCP: Dr. Viviana Simpler REFERRING PROVIDER: Dr. Viviana Simpler   OT End of Session - 10/19/21 1106     Visit Number 8    Number of Visits 24    Date for OT Re-Evaluation 12/07/21    Authorization Time Period Progress report period starting 09/14/2021    OT Start Time 1050    OT Stop Time 1133    OT Time Calculation (min) 43 min    Equipment Utilized During Treatment moist heat    Activity Tolerance Patient tolerated treatment well    Behavior During Therapy WFL for tasks assessed/performed             Past Medical History:  Diagnosis Date   Acute blood loss anemia    Arthritis    Cancer ([place])    skin   Central cord syndrome at C6 level of cervical spinal cord ([place]) 11/29/2017   Hypertension    Protein-calorie malnutrition, severe ([place]) 01/24/2018   S/P insertion of IVC (inferior vena caval) filter 01/24/2018   Tetraparesis ([place])    Past Surgical History:  Procedure Laterality Date   ANTERIOR CERVICAL DECOMP/DISCECTOMY FUSION N/A 11/29/2017   Procedure: Cervical five-six, six-seven Anterior Cervical Decompression Fusion;  Surgeon: Judith Part, MD;  Location: [place];  Service: Neurosurgery;  Laterality: N/A;  Cervical five-six, six-seven Anterior Cervical Decompression Fusion   CATARACT EXTRACTION     FEMUR IM NAIL Left 08/16/2021   Procedure: INTRAMEDULLARY (IM) NAIL FEMORAL;  Surgeon: Earnestine Leys, MD;  Location: [facility];  Service: Orthopedics;  Laterality: Left;   IR IVC FILTER PLMT / S&I /IMG GUID/MOD SED  12/08/2017   Patient Active Problem List   Diagnosis Date Noted   Closed hip fracture ([place]) 08/15/2021   DVT (deep venous thrombosis) ([place]) 08/15/2021    Quadriplegia ([place]) 08/15/2021   Fall 08/15/2021   Acute appendicitis with appendiceal abscess 05/30/2018   Hypocalcemia 02/15/2018   Dysuria 02/15/2018   Bilateral lower extremity edema 02/11/2018   Vaginal yeast infection 01/30/2018   Chest tightness 01/27/2018   Healthcare-associated pneumonia 01/25/2018   Pleural effusion, not elsewhere classified 01/25/2018   Acute deep vein thrombosis (DVT) of left lower extremity ([place]) 01/24/2018   Acute deep vein thrombosis (DVT) of right lower extremity ([place]) 01/24/2018   Chronic allergic rhinitis 01/24/2018   Depression with anxiety 01/24/2018   UTI due to Klebsiella species 01/24/2018   Protein-calorie malnutrition, severe ([place]) 01/24/2018   S/P insertion of IVC (inferior vena caval) filter 01/24/2018  Reactive depression    Hypokalemia    Leukocytosis    Essential hypertension    Trauma    Tetraparesis (HCC)    Neuropathic pain    Neurogenic bowel    Neurogenic bladder    Benign essential HTN    Acute postoperative anemia due to expected blood loss    Central cord syndrome at C6 level of cervical spinal cord ([place]) 11/29/2017   Central cord syndrome ([place]) 11/29/2017   SCC (squamous cell carcinoma) 04/08/2014      REFERRING DIAG: Central Cord Syndrome at C6 of the Cervical Spinal Cord, Fall with Bilateral Closed Hip Fractures, with ORIF repair with Intramedullary Nailing of the Left Hip Fracture.  THERAPY DIAG:  Other lack of coordination  Muscle weakness (generalized)  Central cord syndrome, sequela ([place])  Rationale for Evaluation and Treatment Rehabilitation  PERTINENT HISTORY: Patient s/p fall November 29, 2017 resulting in diagnosis of central cord syndrome at C 6 level.  She has had therapy in multiple venues but with recent move to [facility], therapy staff recommended she seek outpatient therapy for her needs. Pt. Had a recent fall with Bilateral Closed Hip Fractures, with ORIF repair with Intramedullary Nailing of the  Left Hip Fracture.   PRECAUTIONS: None  SUBJECTIVE:   Pt. reports starting PT for UB strengthening.   PAIN:  Are you having pain? No     OBJECTIVE:   TODAY'S TREATMENT:    Therapeutic Ex.  Pt. tolerated PROM followed by AROM for B wrist extension, radial deviation, horizontal shoulder abduction, PROM for bilateral digit MP, PIP, and DIP flexion, and extension, thumb radial, and palmar abduction in conjunction with moist heat. Constant monitoring was provided. Pt. performed 1.5# dowel ex. for UE strengthening secondary to weakness. Bilateral shoulder flexion, overhead press, trunk rotation, and chest press. Tolerated 2 sets x 10-20 reps each.       PATIENT EDUCATION: Education details: BUE ROM, hand function, UE functioning Person educated: Patient Education method: Explanation and Demonstration Education comprehension: verbalized understanding, returned demonstration, and needs further education   HOME EXERCISE PROGRAM Continue with ongoing HEPs     OT Long Term Goals - 07/06/21 1106                                                                                          Target Date: 12/07/2021    OT LONG TERM GOAL #1   Title Patient and caregiver will demonstrate understanding of home exercise program for ROM.    Baseline 09/14/2021:  Pt. Reports inconsistencies with restorative ROM at  her care home. Pt/caregiver education to be provided about ongoing ROM needs    Time 12    Period Weeks    Status New     OT LONG TERM GOAL #2   Title Pt.  Will improve BUE strength to be able to sustain her UE's in elevation to efficiently, and effectively perform hair care, and self-grooming tasks.   Baseline 09/14/2021:  BUE shoulder strength: 3+/5 for shoulder flexion, and abduction.   Time 12    Period Weeks    Status New  OT LONG TERM GOAL #3   Title Patient will demonstrate improved composite finger flexion to hold deodorant to apply to underarms.    Baseline  01/12/2021: Pt. Presents with limited digit extension. Pt. Is able to initiate holding deodorant, however is unable to hold it while using it.   Time 12    Period Weeks    Status New   Target Date                  OT LONG TERM GOAL #4   Title Pt. Will improve bilateral wrist extension in preparation for anticipating, and initiating reaching for objects at the table.    Baseline 09/14/2021:  Right: 17(35), left 2(15)   Time    Period    Status    Target Date      OT LONG TERM GOAL #5   Title Pt. will write, and sign her name with 100% legibility, and modified independence.    Baseline 09/14/2021: Pt. continues to fill out her daily menu and complete puzzles. Pt. Continues to present with limited writing legibility.   Time 12    Period Weeks    Status  New          OT LONG TERM GOAL  #6   TITLE Pt. will turn pages in a book with modified independence.    Baseline 09/14/2021: Pt. has difficulty turning pages.    Time 12    Period Weeks    Status New                  Plan - 07/22/21 1737     Clinical Impression Statement Pt reports starting PT again today for strengthening. Pt tolerates 1.5# dowel for UE exercises, assist for full ROM. Pt. presents with tightness at the bilateral thumb webspace, and bilateral wrist flexor tightness, as well as digit MP, PIP, and DIP extensor tightness which limits her ability to complete ADL tasks efficiently. Pt. continues to benefit from working on impoving ROM in order to work towards increasing engagement of her bilateral hands during ADLs, and IADL tasks.        OT Occupational Profile and History Detailed Assessment- Review of Records and additional review of physical, cognitive, psychosocial history related to current functional performance    Occupational performance deficits (Please refer to evaluation for details): ADL's;IADL's;Leisure;Social Participation    Body Structure / Function / Physical Skills  ADL;Continence;Dexterity;Flexibility;Strength;ROM;Balance;Coordination;FMC;IADL;Endurance;UE functional use;Decreased knowledge of use of DME;GMC    Psychosocial Skills Coping Strategies    Rehab Potential Fair    Clinical Decision Making Several treatment options, min-mod task modification necessary    Comorbidities Affecting Occupational Performance: Presence of comorbidities impacting occupational performance    Comorbidities impacting occupational performance description: contractures of bilateral hands, dependent transfers,    Modification or Assistance to Complete Evaluation  No modification of tasks or assist necessary to complete eval    OT Frequency 2x / week    OT Duration 12 weeks    OT Treatment/Interventions Self-care/ADL training;Cryotherapy;Therapeutic exercise;DME and/or AE instruction;Balance training;Neuromuscular education;Manual Therapy;Splinting;Moist Heat;Passive range of motion;Therapeutic activities;Patient/family education    Plan continue to progress ROM of digits, wrists and shoulders as it pertains to completion of ADL tasks that pt values.    Consulted and Agree with Plan of Care Patient            Dessie Coma, M.S. OTR/L  10/19/21, 11:35 AM  ascom [phone]  Leonides Cave, OT 10/19/2021, 11:35 AM

## 2021-10-19 NOTE — Therapy (Signed)
OUTPATIENT PHYSICAL THERAPY NEURO EVALUATION   Patient Name: Sherry [name] MRN: [id number] DOB:1934/08/24, 87 y.o., female Today's Date: 10/19/2021   PCP: Dewayne Shorter, MD REFERRING PROVIDER: Earnestine Leys   PT End of Session - 10/19/21 1146     Visit Number 1    Number of Visits 24    Date for PT Re-Evaluation 01/11/22    Authorization Time Period Initial cert [id number]    Progress Note Due on Visit 10    PT Start Time [place] During Treatment Gait belt    Activity Tolerance Patient tolerated treatment well;Patient limited by fatigue    Behavior During Therapy [facility] for tasks assessed/performed             Past Medical History:  Diagnosis Date   Acute blood loss anemia    Arthritis    Cancer ([place])    skin   Central cord syndrome at C6 level of cervical spinal cord ([place]) 11/29/2017   Hypertension    Protein-calorie malnutrition, severe ([place]) 01/24/2018   S/P insertion of IVC (inferior vena caval) filter 01/24/2018   Tetraparesis ([place])    Past Surgical History:  Procedure Laterality Date   ANTERIOR CERVICAL DECOMP/DISCECTOMY FUSION N/A 11/29/2017   Procedure: Cervical five-six, six-seven Anterior Cervical Decompression Fusion;  Surgeon: Judith Part, MD;  Location: [place];  Service: Neurosurgery;  Laterality: N/A;  Cervical five-six, six-seven Anterior Cervical Decompression Fusion   CATARACT EXTRACTION     FEMUR IM NAIL Left 08/16/2021   Procedure: INTRAMEDULLARY (IM) NAIL FEMORAL;  Surgeon: Earnestine Leys, MD;  Location: [facility];  Service: Orthopedics;  Laterality: Left;   IR IVC FILTER PLMT / S&I /IMG GUID/MOD SED  12/08/2017   Patient Active Problem List   Diagnosis Date Noted   Closed hip fracture ([place]) 08/15/2021   DVT (deep venous thrombosis) ([place]) 08/15/2021   Quadriplegia ([place]) 08/15/2021   Fall 08/15/2021   Acute appendicitis with appendiceal abscess 05/30/2018   Hypocalcemia 02/15/2018   Dysuria 02/15/2018    Bilateral lower extremity edema 02/11/2018   Vaginal yeast infection 01/30/2018   Chest tightness 01/27/2018   Healthcare-associated pneumonia 01/25/2018   Pleural effusion, not elsewhere classified 01/25/2018   Acute deep vein thrombosis (DVT) of left lower extremity ([place]) 01/24/2018   Acute deep vein thrombosis (DVT) of right lower extremity ([place]) 01/24/2018   Chronic allergic rhinitis 01/24/2018   Depression with anxiety 01/24/2018   UTI due to Klebsiella species 01/24/2018   Protein-calorie malnutrition, severe ([place]) 01/24/2018   S/P insertion of IVC (inferior vena caval) filter 01/24/2018   Reactive depression    Hypokalemia    Leukocytosis    Essential hypertension    Trauma    Tetraparesis (HCC)    Neuropathic pain    Neurogenic bowel    Neurogenic bladder    Benign essential HTN    Acute postoperative anemia due to expected blood loss    Central cord syndrome at C6 level of cervical spinal cord ([place]) 11/29/2017   Central cord syndrome ([place]) 11/29/2017   SCC (squamous cell carcinoma) 04/08/2014    ONSET DATE: 08/15/2021 (fall with B hip fx); Initial injury was in 2019- quadraplegia due to central cord syndrom of C6 secondary to neck fracture.   REFERRING DIAG: Bilateral Hip fx; Left Open- s/p Left IM nail femoral on 08/16/2021; Right closed  THERAPY DIAG:  Muscle weakness (generalized) - Plan: PT plan of care cert/re-cert  Central cord syndrome, sequela ([place]) - Plan: PT plan of  care cert/re-cert  Abnormality of gait and mobility - Plan: PT plan of care cert/re-cert  Difficulty in walking, not elsewhere classified - Plan: PT plan of care cert/re-cert  Other abnormalities of gait and mobility - Plan: PT plan of care cert/re-cert  Unsteadiness on feet - Plan: PT plan of care cert/re-cert  Closed fracture of both hips, initial encounter ([place]) - Plan: PT plan of care cert/re-cert  Rationale for Evaluation and Treatment Rehabilitation  SUBJECTIVE:                                                                                                                                                                                               SUBJECTIVE STATEMENT: Patient reports she has new orders from her MD to at least start working on her arms while her hips continue to heal. Reports eager to start PT again.  Pt accompanied by:  Paid caregiver-   PERTINENT HISTORY: Patient was previous patient (see previos episode for specifics) She experienced a fall at home on 08/15/2021 and was in [facility] from 08/15/2021- 08/20/2021.  Most recent history per Dr. Laurey Arrow on 08/20/2021.  Sherry [name] is a 87 y.o. 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   Patient has history of quadriplegia, and has been doing PT/OT with improvement of function. Pt has some use of both arms and can feed herself. She can walk a little bit with assistance. Pt accidentally fell at about 930 this morning after taking shower.  She injured her hips. Hospital Course:    Closed left hip fracture [facility]) s/p mechanical fall Closed right hip fracture (?old) Acute blood loss anemia --pt is s/p  INTRAMEDULLARY (IM) NAIL FEMORAL-left on 7/16  PAIN:  Are you having pain? Yes: NPRS scale: 8/10 Pain location: Right hip Pain description: Ache Aggravating factors: any active Movement of right side Relieving factors: Rest  PRECAUTIONS: Fall  WEIGHT BEARING RESTRICTIONS Yes Bilateral non-weight bearing B LE's  FALLS: Has patient fallen in last 6 months? Yes. Number of falls 1  LIVING ENVIRONMENT: Lives with: lives with their family and lives in a nursing home Lives in: Other Long term care at [place]: No Has following equipment at home: Wheelchair (power), Ramped  entry, and hoyer lift  PLOF: Needs assistance with ADLs, Needs assistance with homemaking, Needs assistance with gait, Needs assistance with transfers, and Leisure: Work puzzles, read, Watch TV  PATIENT GOALS Patient  goal is to walk; Be able to strengthen my arms for better use  OBJECTIVE:   DIAGNOSTIC FINDINGS: Overall cognitive statusEXAM: DG HIP (WITH OR WITHOUT PELVIS) 2-3V LEFT; DG HIP (WITH OR WITHOUT PELVIS) 2-3V RIGHT   COMPARISON:  CT of the abdomen and pelvis 01/21/2020   FINDINGS: Left proximal femur fracture noted just below the intertrochanteric region. Marked ventral angulation noted on the cross-table image. Slight varus angulation present   Comminuted intratrochanteric fracture present on the right also with marked ventral angulation.   IMPRESSION: 1. Bilateral hip fractures. 2. Comminuted intertrochanteric fracture of the right hip. 3. Angulated fracture of the proximal left femur just below the intertrochanteric region.     Electronically Signed   By: San Morelle M.D.   On: 08/15/2021 11:54:   COGNITION:  Within functional limits for tasks assessed   SENSATION: Light touch: Impaired   EDEMA:  None  Strength R/L 2-/2- Shoulder flexion (anterior deltoid/pec major/coracobrachialis, axillary n. (C5-6) and musculocutaneous n. (C5-7)) 2-/3+ (within available ROM around 130 on lef)Shoulder abduction (deltoid/supraspinatus, axillary/suprascapular n, C5) 3+/3+ Shoulder external rotation (infraspinatus/teres minor) 4/4 Shoulder internal rotation (subcapularis/lats/pec major) 3+/3+ Shoulder extension (posterior deltoid, lats, teres major, axillary/thoracodorsal n.) 3+/3+ Shoulder horizontal abduction 4/4 Elbow flexion (biceps brachii, brachialis, brachioradialis, musculoskeletal n, C5-6) 4/4 Elbow extension (triceps, radial n, C7) 2-/2- Wrist Extension 3+/3+ Wrist Flexion   LOWER EXTREMITY MMT:    MMT Right Eval Left Eval  Hip flexion     Hip extension    Hip abduction    Hip adduction    Hip internal rotation    Hip external rotation    Knee flexion    Knee extension    Ankle dorsiflexion    Ankle plantarflexion    Ankle inversion    Ankle eversion    (Blank rows = not tested) - BLE not tested secondary to weight bearing precaution of B LE NWB  BED MOBILITY:  Unable to test due to NWB status of BLE- Patient currently dependent with all bed mobility using hoyer and caregiver assist  TRANSFERS: Currently BLE NWB- Dependent on use of hoyer for all transfers  FUNCTIONAL TESTs:   PATIENT SURVEYS:  FOTO 12  TODAY'S TREATMENT:  PT eval- plan of care- review of some UE exercises.   PATIENT EDUCATION: Education details: PT plan of care/use of resistive bands Person educated: Patient Education method: Explanation, Demonstration, Tactile cues, and Verbal cues Education comprehension: verbalized understanding, returned demonstration, verbal cues required, tactile cues required, and needs further education   HOME EXERCISE PROGRAM: Discussed use of theraband and demo shoulder flex/abd and elbow flex and ext with use of yellow TB. Patient required assist to position therband but able to perform well with only one person assist    GOALS: Goals reviewed with patient? Yes  SHORT TERM GOALS: Target date: 11/30/2021  Pt will be independent with initial UE strengthening HEP in order to improve strength and balance in order to decrease fall risk and improve function at home and work. Baseline: 10/19/2021- patient with no formal UE HEP Goal status: INITIAL  LONG TERM GOALS: Target date: 01/11/2022  Pt will be independent with final for UE/LE HEP in order to improve strength and balance in order to decrease fall risk and improve function at home and work. Baseline: Patient is currently BLE NWB and unable to participate in HEP. Has order for UE strengthening. Goal status: INITIAL  2.  Pt will improve FOTO to target  score of 35  to display  perceived improvements in ability to complete ADL's.  Baseline: 10/19/2021= 12  Goal status: INITIAL  3.  Pt will increase strength of B UE  by at least 1/2 MMT grade in order to demonstrate improvement in strength and function  Baseline: patient has range of 2-/5 to 4/5 BUE Strength Goal status: INITIAL  ASSESSMENT:  CLINICAL IMPRESSION: Patient is a 87 y.o. female who was seen today for physical therapy evaluation and treatment for Bilateral hip fracture and continued PT for diagnosis of central cord syndrome at C6 level of cervical spinal cord. Patient was receiving outpatient PT services and focusing on LE strengthening, transfers, and walking as appropriate with use of litegait. She had a fall in the home on 7/15 with subsequent bilateral Hip fx- Requiring IM nailing on left and closed hip fx on right. She is currently restricted in mobility- on precautions including B LE Non-weight bearing. MD stated in order to return to PT with focus on UE Strengthening for now. She does present with continued B UE weakness and will benefit from skilled PT services to improve her overall strength. Will update and add more mobility goals once patient is cleared for some weight bearing activities.          OBJECTIVE IMPAIRMENTS Abnormal gait, decreased activity tolerance, decreased balance, decreased coordination, decreased endurance, decreased mobility, difficulty walking, decreased ROM, decreased strength, hypomobility, impaired flexibility, impaired UE functional use, postural dysfunction, and pain.   ACTIVITY LIMITATIONS carrying, lifting, bending, sitting, standing, squatting, sleeping, stairs, transfers, bed mobility, continence, bathing, toileting, dressing, self feeding, reach over head, hygiene/grooming, and caring for others  PARTICIPATION LIMITATIONS: meal prep, cleaning, laundry, medication management, personal finances, interpersonal relationship, driving, shopping,  community activity, and yard work  PERSONAL FACTORS Age, Time since onset of injury/illness/exacerbation, and 1-2 comorbidities: HTN, cervical Sx  are also affecting patient's functional outcome.   REHAB POTENTIAL: Good  CLINICAL DECISION MAKING: Evolving/moderate complexity  EVALUATION COMPLEXITY: Moderate  PLAN: PT FREQUENCY: 2x/week  PT DURATION: 12 weeks  PLANNED INTERVENTIONS: Therapeutic exercises, Therapeutic activity, Neuromuscular re-education, Balance training, Gait training, Patient/Family education, Self Care, Joint mobilization, DME instructions, Dry Needling, Electrical stimulation, Wheelchair mobility training, Spinal mobilization, Cryotherapy, Moist heat, and Manual therapy  PLAN FOR NEXT SESSION: Instruct in home program for UE strengthening   Lewis Moccasin, PT 10/19/2021, 4:00 PM

## 2021-10-21 ENCOUNTER — Ambulatory Visit: Payer: Medicare HMO | Admitting: Occupational Therapy

## 2021-10-21 ENCOUNTER — Ambulatory Visit: Payer: Medicare HMO

## 2021-10-24 ENCOUNTER — Other Ambulatory Visit: Payer: Self-pay | Admitting: Internal Medicine

## 2021-10-24 MED ORDER — OXYCODONE-ACETAMINOPHEN 5-325 MG PO TABS: 1.0000 | ORAL_TABLET | ORAL | 0 refills | Status: DC | PRN

## 2021-10-24 NOTE — Progress Notes (Signed)
Rx sent for [facility] use

## 2021-10-26 ENCOUNTER — Ambulatory Visit: Payer: Medicare HMO | Admitting: Occupational Therapy

## 2021-10-26 ENCOUNTER — Encounter: Payer: Self-pay | Admitting: Occupational Therapy

## 2021-10-26 ENCOUNTER — Ambulatory Visit: Payer: Medicare HMO

## 2021-10-26 DIAGNOSIS — R2689 Other abnormalities of gait and mobility: Secondary | ICD-10-CM

## 2021-10-26 DIAGNOSIS — R278 Other lack of coordination: Secondary | ICD-10-CM

## 2021-10-26 DIAGNOSIS — S14129S Central cord syndrome at unspecified level of cervical spinal cord, sequela: Secondary | ICD-10-CM

## 2021-10-26 DIAGNOSIS — R262 Difficulty in walking, not elsewhere classified: Secondary | ICD-10-CM

## 2021-10-26 DIAGNOSIS — M6281 Muscle weakness (generalized): Secondary | ICD-10-CM

## 2021-10-26 DIAGNOSIS — S72001A Fracture of unspecified part of neck of right femur, initial encounter for closed fracture: Secondary | ICD-10-CM

## 2021-10-26 DIAGNOSIS — R2681 Unsteadiness on feet: Secondary | ICD-10-CM

## 2021-10-26 DIAGNOSIS — R269 Unspecified abnormalities of gait and mobility: Secondary | ICD-10-CM

## 2021-10-26 NOTE — Therapy (Signed)
OUTPATIENT PHYSICAL THERAPY NEURO TREATMENT   Patient Name: Sherry [name] MRN: [id number] DOB:Jun 03, 1934, 87 y.o., female Today's Date: 10/26/2021   PCP: Dewayne Shorter, MD REFERRING PROVIDER: Earnestine Leys   PT End of Session - 10/26/21 1153     Visit Number 2    Number of Visits 24    Date for PT Re-Evaluation 01/11/22    Authorization Time Period Initial cert [id number]    Progress Note Due on Visit 10    PT Start Time 3825    PT Stop Time 1215    PT Time Calculation (min) 30 min    Equipment Utilized During Treatment Gait belt    Activity Tolerance Patient tolerated treatment well;Patient limited by fatigue    Behavior During Therapy WFL for tasks assessed/performed             Past Medical History:  Diagnosis Date   Acute blood loss anemia    Arthritis    Cancer ([place])    skin   Central cord syndrome at C6 level of cervical spinal cord ([place]) 11/29/2017   Hypertension    Protein-calorie malnutrition, severe ([place]) 01/24/2018   S/P insertion of IVC (inferior vena caval) filter 01/24/2018   Tetraparesis ([place])    Past Surgical History:  Procedure Laterality Date   ANTERIOR CERVICAL DECOMP/DISCECTOMY FUSION N/A 11/29/2017   Procedure: Cervical five-six, six-seven Anterior Cervical Decompression Fusion;  Surgeon: Judith Part, MD;  Location: [place];  Service: Neurosurgery;  Laterality: N/A;  Cervical five-six, six-seven Anterior Cervical Decompression Fusion   CATARACT EXTRACTION     FEMUR IM NAIL Left 08/16/2021   Procedure: INTRAMEDULLARY (IM) NAIL FEMORAL;  Surgeon: Earnestine Leys, MD;  Location: [facility];  Service: Orthopedics;  Laterality: Left;   IR IVC FILTER PLMT / S&I /IMG GUID/MOD SED  12/08/2017   Patient Active Problem List   Diagnosis Date Noted   Closed hip fracture ([place]) 08/15/2021   DVT (deep venous thrombosis) ([place]) 08/15/2021   Quadriplegia ([place]) 08/15/2021   Fall 08/15/2021   Acute appendicitis with appendiceal abscess  05/30/2018   Hypocalcemia 02/15/2018   Dysuria 02/15/2018   Bilateral lower extremity edema 02/11/2018   Vaginal yeast infection 01/30/2018   Chest tightness 01/27/2018   Healthcare-associated pneumonia 01/25/2018   Pleural effusion, not elsewhere classified 01/25/2018   Acute deep vein thrombosis (DVT) of left lower extremity ([place]) 01/24/2018   Acute deep vein thrombosis (DVT) of right lower extremity ([place]) 01/24/2018   Chronic allergic rhinitis 01/24/2018   Depression with anxiety 01/24/2018   UTI due to Klebsiella species 01/24/2018   Protein-calorie malnutrition, severe ([place]) 01/24/2018   S/P insertion of IVC (inferior vena caval) filter 01/24/2018   Reactive depression    Hypokalemia    Leukocytosis    Essential hypertension    Trauma    Tetraparesis (HCC)    Neuropathic pain    Neurogenic bowel    Neurogenic bladder    Benign essential HTN    Acute postoperative anemia due to expected blood loss    Central cord syndrome at C6 level of cervical spinal cord ([place]) 11/29/2017   Central cord syndrome ([place]) 11/29/2017   SCC (squamous cell carcinoma) 04/08/2014    ONSET DATE: 08/15/2021 (fall with B hip fx); Initial injury was in 2019- quadraplegia due to central cord syndrom of C6 secondary to neck fracture.   REFERRING DIAG: Bilateral Hip fx; Left Open- s/p Left IM nail femoral on 08/16/2021; Right closed  THERAPY DIAG:  Muscle weakness (generalized)  Central  cord syndrome, sequela (HCC)  Abnormality of gait and mobility  Difficulty in walking, not elsewhere classified  Other abnormalities of gait and mobility  Unsteadiness on feet  Closed fracture of both hips, initial encounter ([place])  Other lack of coordination  Rationale for Evaluation and Treatment Rehabilitation  SUBJECTIVE:                                                                                                                                                                                               SUBJECTIVE STATEMENT:  Patient reports no changes since last visit. Reports returns to MD for update on 11/18/2021.     Pt accompanied by:  Paid caregiver-   PERTINENT HISTORY: Patient was previous patient (see previos episode for specifics) She experienced a fall at home on 08/15/2021 and was in [facility] from 08/15/2021- 08/20/2021.  Most recent history per Dr. Laurey Arrow on 08/20/2021.  Sherry [name] is a 87 y.o. 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   Patient has history of quadriplegia, and has been doing PT/OT with improvement of function. Pt has some use of both arms and can feed herself. She can walk a little bit with assistance. Pt accidentally fell at about 930 this morning after taking shower.  She injured her hips. Hospital Course:    Closed left hip fracture [facility]) s/p mechanical fall Closed right hip fracture (?old) Acute blood loss anemia --pt is s/p  INTRAMEDULLARY (IM) NAIL FEMORAL-left on 7/16  PAIN:  Are you having pain? NO: NPRS scale: 0/10 Pain location: Right hip Pain description: Ache Aggravating factors: any active Movement of right side Relieving factors: Rest  PRECAUTIONS: Fall  WEIGHT BEARING RESTRICTIONS Yes Bilateral non-weight bearing B LE's  FALLS: Has patient fallen in last 6 months? Yes. Number of falls 1  LIVING ENVIRONMENT: Lives with: lives with their family and lives in a nursing home Lives in: Other Long term care at [place]: No Has following equipment at home: Wheelchair (power), Ramped entry, and hoyer lift  PLOF: Needs assistance with ADLs, Needs assistance with homemaking, Needs assistance with gait, Needs assistance with transfers, and Leisure: Work puzzles, read, Watch TV  PATIENT GOALS  Patient goal is to walk; Be able to strengthen my arms for better use  OBJECTIVE:   DIAGNOSTIC FINDINGS: Overall cognitive statusEXAM: DG HIP (WITH OR WITHOUT PELVIS) 2-3V LEFT; DG HIP (WITH OR WITHOUT PELVIS) 2-3V RIGHT  COMPARISON:  CT of the abdomen and pelvis 01/21/2020   FINDINGS: Left proximal femur fracture noted just below the intertrochanteric region. Marked ventral angulation noted on the cross-table image. Slight varus angulation present   Comminuted intratrochanteric fracture present on the right also with marked ventral angulation.   IMPRESSION: 1. Bilateral hip fractures. 2. Comminuted intertrochanteric fracture of the right hip. 3. Angulated fracture of the proximal left femur just below the intertrochanteric region.     Electronically Signed   By: San Morelle M.D.   On: 08/15/2021 11:54:   COGNITION:  Within functional limits for tasks assessed   SENSATION: Light touch: Impaired   EDEMA:  None  Strength R/L 2-/2- Shoulder flexion (anterior deltoid/pec major/coracobrachialis, axillary n. (C5-6) and musculocutaneous n. (C5-7)) 2-/3+ (within available ROM around 130 on lef)Shoulder abduction (deltoid/supraspinatus, axillary/suprascapular n, C5) 3+/3+ Shoulder external rotation (infraspinatus/teres minor) 4/4 Shoulder internal rotation (subcapularis/lats/pec major) 3+/3+ Shoulder extension (posterior deltoid, lats, teres major, axillary/thoracodorsal n.) 3+/3+ Shoulder horizontal abduction 4/4 Elbow flexion (biceps brachii, brachialis, brachioradialis, musculoskeletal n, C5-6) 4/4 Elbow extension (triceps, radial n, C7) 2-/2- Wrist Extension 3+/3+ Wrist Flexion   LOWER EXTREMITY MMT:    MMT Right Eval Left Eval  Hip flexion    Hip extension    Hip abduction    Hip adduction    Hip internal rotation    Hip external rotation    Knee flexion    Knee extension    Ankle dorsiflexion    Ankle plantarflexion    Ankle inversion     Ankle eversion    (Blank rows = not tested) - BLE not tested secondary to weight bearing precaution of B LE NWB  BED MOBILITY:  Unable to test due to NWB status of BLE- Patient currently dependent with all bed mobility using hoyer and caregiver assist  TRANSFERS: Currently BLE NWB- Dependent on use of hoyer for all transfers  FUNCTIONAL TESTs:   PATIENT SURVEYS:  FOTO 12  TODAY'S TREATMENT:   10/26/2021  Postural strengthening:   Horizontal Shoulder Abd BUE with yellow TB 2 sets of 12 reps D2 PNF shoulder flex BUE with yellow TB 2 sets of 12 reps Chest press BUE with yellow TB 2 sets of 12 reps Scap row BUE with yellow TB 2 sets of 12 reps Bicep curl BUE with yellow TB 2 sets of 12 reps Tricep press down BUE with yellow TB 2 sets of 12 reps   PATIENT EDUCATION: Education details: PT plan of care/use of resistive bands Person educated: Patient Education method: Explanation, Demonstration, Tactile cues, and Verbal cues Education comprehension: verbalized understanding, returned demonstration, verbal cues required, tactile cues required, and needs further education   HOME EXERCISE PROGRAM: Discussed use of theraband and demo shoulder flex/abd and elbow flex and ext with use of yellow TB. Patient required assist to position therband but able to perform well with only one person assist    GOALS: Goals reviewed with patient? Yes  SHORT TERM GOALS: Target date: 12/07/2021  Pt will be independent with initial UE strengthening HEP in order to improve strength and balance in order to decrease fall risk and improve function at home and work. Baseline: 10/19/2021- patient with no formal UE HEP Goal status: INITIAL  LONG TERM GOALS: Target date: 01/18/2022  Pt will be independent with final for UE/LE HEP in order to improve strength and balance in order to decrease fall risk and improve function at home and work. Baseline: Patient is currently BLE NWB and unable to  participate  in HEP. Has order for UE strengthening. Goal status: INITIAL  2.  Pt will improve FOTO to target score of 35  to display perceived improvements in ability to complete ADL's.  Baseline: 10/19/2021= 12  Goal status: INITIAL  3.  Pt will increase strength of B UE  by at least 1/2 MMT grade in order to demonstrate improvement in strength and function  Baseline: patient has range of 2-/5 to 4/5 BUE Strength Goal status: INITIAL  ASSESSMENT:  CLINICAL IMPRESSION: Patient presents with good motivation for today's session. She was able to use some resistance with postural strengthening and performed well with VC and visual demo for correct technique. Issued YTB for HEP today and patient verbalized good understanding of adding today's exercises to her program.  Patient will benefit from skilled PT services to improve her overall strength. Will update and add more mobility goals once patient is cleared for some weight bearing activities.          OBJECTIVE IMPAIRMENTS Abnormal gait, decreased activity tolerance, decreased balance, decreased coordination, decreased endurance, decreased mobility, difficulty walking, decreased ROM, decreased strength, hypomobility, impaired flexibility, impaired UE functional use, postural dysfunction, and pain.   ACTIVITY LIMITATIONS carrying, lifting, bending, sitting, standing, squatting, sleeping, stairs, transfers, bed mobility, continence, bathing, toileting, dressing, self feeding, reach over head, hygiene/grooming, and caring for others  PARTICIPATION LIMITATIONS: meal prep, cleaning, laundry, medication management, personal finances, interpersonal relationship, driving, shopping, community activity, and yard work  PERSONAL FACTORS Age, Time since onset of injury/illness/exacerbation, and 1-2 comorbidities: HTN, cervical Sx  are also affecting patient's functional outcome.   REHAB POTENTIAL: Good  CLINICAL DECISION MAKING: Evolving/moderate  complexity  EVALUATION COMPLEXITY: Moderate  PLAN: PT FREQUENCY: 2x/week  PT DURATION: 12 weeks  PLANNED INTERVENTIONS: Therapeutic exercises, Therapeutic activity, Neuromuscular re-education, Balance training, Gait training, Patient/Family education, Self Care, Joint mobilization, DME instructions, Dry Needling, Electrical stimulation, Wheelchair mobility training, Spinal mobilization, Cryotherapy, Moist heat, and Manual therapy  PLAN FOR NEXT SESSION: Instruct in home program for UE strengthening   Lewis Moccasin, PT 10/26/2021, 3:23 PM

## 2021-10-26 NOTE — Therapy (Signed)
OUTPATIENT OCCUPATIONAL THERAPY NOTE   Patient Name: Sherry [name] MRN: [id number] DOB:[birth year], 87 y.o., female Today's Date: 10/26/2021                                                                                                                                                         PCP: Dr. Viviana Simpler REFERRING PROVIDER: Dr. Viviana Simpler   OT End of Session - 10/26/21 1210     Visit Number 9    Number of Visits 24    Date for OT Re-Evaluation 12/07/21    Authorization Time Period Progress report period starting 09/14/2021    OT Start Time 1100    OT Stop Time 1143    OT Time Calculation (min) 43 min    Activity Tolerance Patient tolerated treatment well    Behavior During Therapy [facility] for tasks assessed/performed             Past Medical History:  Diagnosis Date   Acute blood loss anemia    Arthritis    Cancer ([place])    skin   Central cord syndrome at C6 level of cervical spinal cord ([place]) 11/29/2017   Hypertension    Protein-calorie malnutrition, severe ([place]) 01/24/2018   S/P insertion of IVC (inferior vena caval) filter 01/24/2018   Tetraparesis ([place])    Past Surgical History:  Procedure Laterality Date   ANTERIOR CERVICAL DECOMP/DISCECTOMY FUSION N/A 11/29/2017   Procedure: Cervical five-six, six-seven Anterior Cervical Decompression Fusion;  Surgeon: Judith Part, MD;  Location: [place];  Service: Neurosurgery;  Laterality: N/A;  Cervical five-six, six-seven Anterior Cervical Decompression Fusion   CATARACT EXTRACTION     FEMUR IM NAIL Left 08/16/2021   Procedure: INTRAMEDULLARY (IM) NAIL FEMORAL;  Surgeon: Earnestine Leys, MD;  Location: [facility];  Service: Orthopedics;  Laterality: Left;   IR IVC FILTER PLMT / S&I /IMG GUID/MOD SED  12/08/2017   Patient Active Problem List   Diagnosis Date Noted   Closed hip fracture ([place]) 08/15/2021   DVT (deep venous thrombosis) ([place]) 08/15/2021   Quadriplegia ([place]) 08/15/2021   Fall 08/15/2021   Acute  appendicitis with appendiceal abscess 05/30/2018   Hypocalcemia 02/15/2018   Dysuria 02/15/2018   Bilateral lower extremity edema 02/11/2018   Vaginal yeast infection 01/30/2018   Chest tightness 01/27/2018   Healthcare-associated pneumonia 01/25/2018   Pleural effusion, not elsewhere classified 01/25/2018   Acute deep vein thrombosis (DVT) of left lower extremity ([place]) 01/24/2018   Acute deep vein thrombosis (DVT) of right lower extremity ([place]) 01/24/2018   Chronic allergic rhinitis 01/24/2018   Depression with anxiety 01/24/2018   UTI due to Klebsiella species 01/24/2018   Protein-calorie malnutrition, severe ([place]) 01/24/2018   S/P insertion of IVC (inferior vena caval) filter 01/24/2018   Reactive depression    Hypokalemia  Leukocytosis    Essential hypertension    Trauma    Tetraparesis (HCC)    Neuropathic pain    Neurogenic bowel    Neurogenic bladder    Benign essential HTN    Acute postoperative anemia due to expected blood loss    Central cord syndrome at C6 level of cervical spinal cord ([place]) 11/29/2017   Central cord syndrome ([place]) 11/29/2017   SCC (squamous cell carcinoma) 04/08/2014      REFERRING DIAG: Central Cord Syndrome at C6 of the Cervical Spinal Cord, Fall with Bilateral Closed Hip Fractures, with ORIF repair with Intramedullary Nailing of the Left Hip Fracture.  THERAPY DIAG:  Muscle weakness (generalized)  Rationale for Evaluation and Treatment Rehabilitation  PERTINENT HISTORY: Patient s/p fall November 29, 2017 resulting in diagnosis of central cord syndrome at C 6 level.  She has had therapy in multiple venues but with recent move to [facility], therapy staff recommended she seek outpatient therapy for her needs. Pt. Had a recent fall with Bilateral Closed Hip Fractures, with ORIF repair with Intramedullary Nailing of the Left Hip Fracture.   PRECAUTIONS: None  SUBJECTIVE:   Pt. reports having had a cramping in her leg last night.  PAIN:  Are  you having pain? No     OBJECTIVE:   TODAY'S TREATMENT:    Manual Therapy:  Pt. tolerated soft tissue massage to the volar, and dorsal surface of each digit on the bilateral hands secondary to stiffness. Manual therapy was performed independent of, and in preparation for therapeutic Ex.  Therapeutic Ex.   Pt. worked on [employer], and reciprocal motion using the UBE while seated for 8 min. with minimal resistance. Constant monitoring was provided. Pt. tolerated PROM followed by AROM bilateral wrist extension, PROM for bilateral digit MP, PIP, and DIP flexion, and extension, thumb radial, and palmar abduction in conjunction with moist heat. BUE strengthening was performed using 1.5# dowel for shoulder flexion,  circular, and bilateral horizontal "V" motion. Strengthening was performed in preparation for functional reaching, and functional hand use.    Pt. Reports that she follows up with her orthopedic physician on Oct. 18th. Pt. tolerated manual therapy, and UE there. ex well today without reports of pain following moist heat modality to the bilateral shoulders, and hands. Pt. presents with tightness at the bilateral thumb webspace, and bilateral wrist flexor tightness, as well as digit MP, PIP, and DIP extensor tightness which continues to limit her ability to complete ADL tasks efficiently. Pt. continues to benefit from working on impoving ROM in order to work towards increasing engagement of her bilateral hands during ADLs, and IADL tasks.         PATIENT EDUCATION: Education details: BUE ROM, hand function, UE functioning Person educated: Patient Education method: Explanation and Demonstration Education comprehension: verbalized understanding, returned demonstration, and needs further education   HOME EXERCISE PROGRAM Continue with ongoing HEPs     OT Long Term Goals - 07/06/21 1106                                                                                           Target  Date: 12/07/2021    OT LONG TERM GOAL #1   Title Patient and caregiver will demonstrate understanding of home exercise program for ROM.    Baseline 09/14/2021:  Pt. Reports inconsistencies with restorative ROM at  her care home. Pt/caregiver education to be provided about ongoing ROM needs    Time 12    Period Weeks    Status New     OT LONG TERM GOAL #2   Title Pt.  Will improve BUE strength to be able to sustain her UE's in elevation to efficiently, and effectively perform hair care, and self-grooming tasks.   Baseline 09/14/2021:  BUE shoulder strength: 3+/5 for shoulder flexion, and abduction.   Time 12    Period Weeks    Status New               OT LONG TERM GOAL #3   Title Patient will demonstrate improved composite finger flexion to hold deodorant to apply to underarms.    Baseline 01/12/2021: Pt. Presents with limited digit extension. Pt. Is able to initiate holding deodorant, however is unable to hold it while using it.   Time 12    Period Weeks    Status New   Target Date                  OT LONG TERM GOAL #4   Title Pt. Will improve bilateral wrist extension in preparation for anticipating, and initiating reaching for objects at the table.    Baseline 09/14/2021:  Right: 17(35), left 2(15)   Time    Period    Status    Target Date      OT LONG TERM GOAL #5   Title Pt. will write, and sign her name with 100% legibility, and modified independence.    Baseline 09/14/2021: Pt. continues to fill out her daily menu and complete puzzles. Pt. Continues to present with limited writing legibility.   Time 12    Period Weeks    Status  New          OT LONG TERM GOAL  #6   TITLE Pt. will turn pages in a book with modified independence.    Baseline 09/14/2021: Pt. has difficulty turning pages.    Time 12    Period Weeks    Status New                  Plan - 07/22/21 1737     Clinical Impression Statement Pt. Reports that she follows up with her orthopedic  physician on Oct. 18th. Pt. tolerated manual therapy, and UE there. ex well today without reports of pain following moist heat modality to the bilateral shoulders, and hands. Pt. presents with tightness at the bilateral thumb webspace, and bilateral wrist flexor tightness, as well as digit MP, PIP, and DIP extensor tightness which continues to limit her ability to complete ADL tasks efficiently. Pt. continues to benefit from working on impoving ROM in order to work towards increasing engagement of her bilateral hands during ADLs, and IADL tasks.                    OT Occupational Profile and History Detailed Assessment- Review of Records and additional review of physical, cognitive, psychosocial history related to current functional performance    Occupational performance deficits (Please refer to evaluation for details): ADL's;IADL's;Leisure;Social Participation    Body Structure / Function / Physical Skills ADL;Continence;Dexterity;Flexibility;Strength;ROM;Balance;Coordination;FMC;IADL;Endurance;UE functional use;Decreased knowledge of use of DME;[facility]  Psychosocial Skills Coping Strategies    Rehab Potential Fair    Clinical Decision Making Several treatment options, min-mod task modification necessary    Comorbidities Affecting Occupational Performance: Presence of comorbidities impacting occupational performance    Comorbidities impacting occupational performance description: contractures of bilateral hands, dependent transfers,    Modification or Assistance to Complete Evaluation  No modification of tasks or assist necessary to complete eval    OT Frequency 2x / week    OT Duration 12 weeks    OT Treatment/Interventions Self-care/ADL training;Cryotherapy;Therapeutic exercise;DME and/or AE instruction;Balance training;Neuromuscular education;Manual Therapy;Splinting;Moist Heat;Passive range of motion;Therapeutic activities;Patient/family education    Plan continue to progress ROM of digits,  wrists and shoulders as it pertains to completion of ADL tasks that pt values.    Consulted and Agree with Plan of Care Patient            Harrel Carina, MS, OTR/L  Harrel Carina, OT 10/26/2021, 12:13 PM

## 2021-10-28 ENCOUNTER — Ambulatory Visit: Payer: Medicare HMO | Admitting: Occupational Therapy

## 2021-10-28 ENCOUNTER — Ambulatory Visit: Payer: Medicare HMO

## 2021-10-28 DIAGNOSIS — S14129S Central cord syndrome at unspecified level of cervical spinal cord, sequela: Secondary | ICD-10-CM

## 2021-10-28 DIAGNOSIS — R278 Other lack of coordination: Secondary | ICD-10-CM

## 2021-10-28 DIAGNOSIS — R2681 Unsteadiness on feet: Secondary | ICD-10-CM

## 2021-10-28 DIAGNOSIS — R2689 Other abnormalities of gait and mobility: Secondary | ICD-10-CM

## 2021-10-28 DIAGNOSIS — M6281 Muscle weakness (generalized): Secondary | ICD-10-CM

## 2021-10-28 DIAGNOSIS — S72002A Fracture of unspecified part of neck of left femur, initial encounter for closed fracture: Secondary | ICD-10-CM

## 2021-10-28 DIAGNOSIS — R262 Difficulty in walking, not elsewhere classified: Secondary | ICD-10-CM

## 2021-10-28 DIAGNOSIS — R269 Unspecified abnormalities of gait and mobility: Secondary | ICD-10-CM

## 2021-10-28 NOTE — Therapy (Signed)
Occupational Therapy Progress Note  Dates of reporting period  09/14/2021   to    10/28/2021   Patient Name: Sherry [name] MRN: [id number] DOB:[birth year], 87 y.o., female Today's Date: 10/28/2021                                                                                                                                                         PCP: Dr. Viviana Simpler REFERRING PROVIDER: Dr. Viviana Simpler   OT End of Session - 10/28/21 1111     Visit Number 10    Number of Visits 24    Date for OT Re-Evaluation 12/07/21    Authorization Time Period Progress report period starting 09/14/2021    OT Start Time 1105    OT Stop Time 1145    OT Time Calculation (min) 40 min    Activity Tolerance Patient tolerated treatment well    Behavior During Therapy [facility] for tasks assessed/performed             Past Medical History:  Diagnosis Date   Acute blood loss anemia    Arthritis    Cancer ([place])    skin   Central cord syndrome at C6 level of cervical spinal cord ([place]) 11/29/2017   Hypertension    Protein-calorie malnutrition, severe ([place]) 01/24/2018   S/P insertion of IVC (inferior vena caval) filter 01/24/2018   Tetraparesis ([employer])    Past Surgical History:  Procedure Laterality Date   ANTERIOR CERVICAL DECOMP/DISCECTOMY FUSION N/A 11/29/2017   Procedure: Cervical five-six, six-seven Anterior Cervical Decompression Fusion;  Surgeon: Judith Part, MD;  Location: [place];  Service: Neurosurgery;  Laterality: N/A;  Cervical five-six, six-seven Anterior Cervical Decompression Fusion   CATARACT EXTRACTION     FEMUR IM NAIL Left 08/16/2021   Procedure: INTRAMEDULLARY (IM) NAIL FEMORAL;  Surgeon: Earnestine Leys, MD;  Location: [facility];  Service: Orthopedics;  Laterality: Left;   IR IVC FILTER PLMT / S&I /IMG GUID/MOD SED  12/08/2017   Patient Active Problem List   Diagnosis Date Noted   Closed hip fracture ([place]) 08/15/2021   DVT (deep venous thrombosis) ([place]) 08/15/2021    Quadriplegia ([place]) 08/15/2021   Fall 08/15/2021   Acute appendicitis with appendiceal abscess 05/30/2018   Hypocalcemia 02/15/2018   Dysuria 02/15/2018   Bilateral lower extremity edema 02/11/2018   Vaginal yeast infection 01/30/2018   Chest tightness 01/27/2018   Healthcare-associated pneumonia 01/25/2018   Pleural effusion, not elsewhere classified 01/25/2018   Acute deep vein thrombosis (DVT) of left lower extremity ([place]) 01/24/2018   Acute deep vein thrombosis (DVT) of right lower extremity ([place]) 01/24/2018   Chronic allergic rhinitis 01/24/2018   Depression with anxiety 01/24/2018   UTI due to Klebsiella species 01/24/2018   Protein-calorie malnutrition, severe ([place]) 01/24/2018   S/P insertion of IVC (inferior vena  caval) filter 01/24/2018   Reactive depression    Hypokalemia    Leukocytosis    Essential hypertension    Trauma    Tetraparesis (HCC)    Neuropathic pain    Neurogenic bowel    Neurogenic bladder    Benign essential HTN    Acute postoperative anemia due to expected blood loss    Central cord syndrome at C6 level of cervical spinal cord ([place]) 11/29/2017   Central cord syndrome ([place]) 11/29/2017   SCC (squamous cell carcinoma) 04/08/2014      REFERRING DIAG: Central Cord Syndrome at C6 of the Cervical Spinal Cord, Fall with Bilateral Closed Hip Fractures, with ORIF repair with Intramedullary Nailing of the Left Hip Fracture.  THERAPY DIAG:  Muscle weakness (generalized)  Rationale for Evaluation and Treatment Rehabilitation  PERTINENT HISTORY: Patient s/p fall November 29, 2017 resulting in diagnosis of central cord syndrome at C 6 level.  She has had therapy in multiple venues but with recent move to [facility], therapy staff recommended she seek outpatient therapy for her needs. Pt. Had a recent fall with Bilateral Closed Hip Fractures, with ORIF repair with Intramedullary Nailing of the Left Hip Fracture.   PRECAUTIONS: None  SUBJECTIVE:   Pt. reports  having had a cramping in her leg last night.  PAIN:  Are you having pain? No     OBJECTIVE:   TODAY'S TREATMENT:    Manual Therapy:  Pt. tolerated soft tissue massage to the volar, and dorsal surface of each digit on the bilateral hands secondary to stiffness. Manual therapy was performed independent of, and in preparation for therapeutic Ex.  Therapeutic Ex.   Pt. tolerated PROM followed by AROM bilateral wrist extension, PROM for bilateral digit MP, PIP, and DIP flexion, and extension, thumb radial, and palmar abduction in conjunction with moist heat.  Measurements were obtained, and goals were reviewed with the Pt. Pt. has made progress with UE strength, and right wrist Active, and Passive ROM.  Pt. has improved with proximal strength in the LUE, with improved ability to reach up to perform hair care. Pt. Is now able to turn pages of a book consistently, and has met the goal. Pt. continues to tolerate manual therapy, and UE there. ex well without reports of pain following moist heat modality to the bilateral shoulders, and hands. Pt. Has difficulty sustaining UEs in elevation to thoroughly complete hair care. Pt. presents with tightness at the bilateral thumb webspace, and bilateral wrist flexor tightness, as well as digit MP, PIP, and DIP extensor tightness which continues to limit her ability to complete ADL tasks efficiently. Pt. continues to benefit from working on impoving ROM in the left wrist, gross digit flexion, and UE strength in order to work towards increasing engagement of her bilateral hands to be able to hold and use items during ADLs, and IADL tasks.      PATIENT EDUCATION: Education details: BUE ROM, hand function, UE functioning Person educated: Patient Education method: Explanation and Demonstration Education comprehension: verbalized understanding, returned demonstration, and needs further education   HOME EXERCISE PROGRAM Continue with ongoing HEPs     OT  Long Term Goals - 07/06/21 1106  Target Date: 12/07/2021    OT LONG TERM GOAL #1   Title Patient and caregiver will demonstrate understanding of home exercise program for ROM.    Baseline 10/28/2021: Pt. Is attempting exercises/stretches at home. The facility restorative aides have not resumed exercises. 09/14/2021:  Pt. Reports inconsistencies with restorative ROM at  her care home. Pt/caregiver education to be provided about ongoing ROM needs.    Time 12    Period Weeks    Status ongoing     OT LONG TERM GOAL #2   Title Pt.  Will improve BUE strength to be able to sustain her UE's in elevation to efficiently, and effectively perform hair care, and self-grooming tasks.   Baseline 10/28/2021: Right shoulder 3+/5, Left shoulder 4-/5 09/14/2021:  BUE shoulder strength: 3+/5 for shoulder flexion, and abduction.   Time 12    Period Weeks    Status Ongoing               OT LONG TERM GOAL #3   Title Patient will demonstrate improved composite finger flexion to hold deodorant to apply to underarms.    Baseline 10/28/2021:  Pt. Continues to have difficulty holding the deodorant. 01/12/2021: Pt. Presents with limited digit extension. Pt. Is able to initiate holding deodorant, however is unable to hold it while using it.   Time 12    Period Weeks    Status Ongoing   Target Date                  OT LONG TERM GOAL #4   Title Pt. Will improve bilateral wrist extension in preparation for anticipating, and initiating reaching for objects at the table.    Baseline 10/28/2021:  Right 22(38), Left 0(15) 09/14/2021:  Right: 17(35), left 2(15)   Time 12   Period weeks   Status Ongoing   Target Date      OT LONG TERM GOAL #5   Title Pt. will write, and sign her name with 100% legibility, and modified independence.    Baseline 10/28/2021: pt. is able to sign her name, however legibility continues to be inconsistent. Pt.  Reports "shaky appearance". 09/14/2021: Pt. continues to fill out her daily menu and complete puzzles. Pt. Continues to present with limited writing legibility.   Time 12    Period Weeks    Status  New          OT LONG TERM GOAL  #6   TITLE Pt. will turn pages in a book with modified independence.    Baseline 10/28/2021: Pt. Is able to consistently turn book pages. 09/14/2021: Pt. has difficulty turning pages.    Time 12    Period Weeks    Status Achieved                 Plan - 07/22/21 1737     Clinical Impression Statement Measurements were obtained, and goals were reviewed with the Pt. Pt. has made progress with UE strength, and right wrist Active, and Passive ROM.  Pt. has improved with proximal strength in the LUE, with improved ability to reach up to perform hair care. Pt. Is now able to turn pages of a book consistently, and has met the goal. Pt. continues to tolerate manual therapy, and UE there. ex well without reports of pain following moist heat modality to the bilateral shoulders, and hands. Pt. Has difficulty sustaining UEs in elevation to thoroughly complete hair care. Pt. presents with tightness at the bilateral thumb webspace, and  bilateral wrist flexor tightness, as well as digit MP, PIP, and DIP extensor tightness which continues to limit her ability to complete ADL tasks efficiently. Pt. continues to benefit from working on impoving ROM in the left wrist, gross digit flexion, and UE strength in order to work towards increasing engagement of her bilateral hands to be able to hold and use items during ADLs, and IADL tasks.                      OT Occupational Profile and History Detailed Assessment- Review of Records and additional review of physical, cognitive, psychosocial history related to current functional performance    Occupational performance deficits (Please refer to evaluation for details): ADL's;IADL's;Leisure;Social Participation    Body Structure /  Function / Physical Skills ADL;Continence;Dexterity;Flexibility;Strength;ROM;Balance;Coordination;FMC;IADL;Endurance;UE functional use;Decreased knowledge of use of DME;GMC    Psychosocial Skills Coping Strategies    Rehab Potential Fair    Clinical Decision Making Several treatment options, min-mod task modification necessary    Comorbidities Affecting Occupational Performance: Presence of comorbidities impacting occupational performance    Comorbidities impacting occupational performance description: contractures of bilateral hands, dependent transfers,    Modification or Assistance to Complete Evaluation  No modification of tasks or assist necessary to complete eval    OT Frequency 2x / week    OT Duration 12 weeks    OT Treatment/Interventions Self-care/ADL training;Cryotherapy;Therapeutic exercise;DME and/or AE instruction;Balance training;Neuromuscular education;Manual Therapy;Splinting;Moist Heat;Passive range of motion;Therapeutic activities;Patient/family education    Plan continue to progress ROM of digits, wrists and shoulders as it pertains to completion of ADL tasks that pt values.    Consulted and Agree with Plan of Care Patient            Harrel Carina, MS, OTR/L  Harrel Carina, OT 10/28/2021, 11:15 AM

## 2021-10-28 NOTE — Therapy (Signed)
OUTPATIENT PHYSICAL THERAPY NEURO TREATMENT   Patient Name: Sherry [name] MRN: [id number] DOB:Apr 10, 1934, 87 y.o., female Today's Date: 10/28/2021   PCP: Dewayne Shorter, MD REFERRING PROVIDER: Earnestine Leys   PT End of Session - 10/28/21 1055     Visit Number 3    Number of Visits 24    Date for PT Re-Evaluation 01/11/22    Authorization Time Period Initial cert 06/29/4130-44/02/270    Progress Note Due on Visit 10    PT Start Time 1146    PT Stop Time 1218    PT Time Calculation (min) 32 min    Equipment Utilized During Treatment Gait belt    Activity Tolerance Patient tolerated treatment well;Patient limited by fatigue    Behavior During Therapy WFL for tasks assessed/performed             Past Medical History:  Diagnosis Date   Acute blood loss anemia    Arthritis    Cancer ([place])    skin   Central cord syndrome at C6 level of cervical spinal cord ([place]) 11/29/2017   Hypertension    Protein-calorie malnutrition, severe ([place]) 01/24/2018   S/P insertion of IVC (inferior vena caval) filter 01/24/2018   Tetraparesis ([place])    Past Surgical History:  Procedure Laterality Date   ANTERIOR CERVICAL DECOMP/DISCECTOMY FUSION N/A 11/29/2017   Procedure: Cervical five-six, six-seven Anterior Cervical Decompression Fusion;  Surgeon: Judith Part, MD;  Location: [place];  Service: Neurosurgery;  Laterality: N/A;  Cervical five-six, six-seven Anterior Cervical Decompression Fusion   CATARACT EXTRACTION     FEMUR IM NAIL Left 08/16/2021   Procedure: INTRAMEDULLARY (IM) NAIL FEMORAL;  Surgeon: Earnestine Leys, MD;  Location: [facility];  Service: Orthopedics;  Laterality: Left;   IR IVC FILTER PLMT / S&I /IMG GUID/MOD SED  12/08/2017   Patient Active Problem List   Diagnosis Date Noted   Closed hip fracture ([place]) 08/15/2021   DVT (deep venous thrombosis) ([place]) 08/15/2021   Quadriplegia ([place]) 08/15/2021   Fall 08/15/2021   Acute appendicitis with appendiceal abscess  05/30/2018   Hypocalcemia 02/15/2018   Dysuria 02/15/2018   Bilateral lower extremity edema 02/11/2018   Vaginal yeast infection 01/30/2018   Chest tightness 01/27/2018   Healthcare-associated pneumonia 01/25/2018   Pleural effusion, not elsewhere classified 01/25/2018   Acute deep vein thrombosis (DVT) of left lower extremity ([place]) 01/24/2018   Acute deep vein thrombosis (DVT) of right lower extremity ([place]) 01/24/2018   Chronic allergic rhinitis 01/24/2018   Depression with anxiety 01/24/2018   UTI due to Klebsiella species 01/24/2018   Protein-calorie malnutrition, severe ([place]) 01/24/2018   S/P insertion of IVC (inferior vena caval) filter 01/24/2018   Reactive depression    Hypokalemia    Leukocytosis    Essential hypertension    Trauma    Tetraparesis (HCC)    Neuropathic pain    Neurogenic bowel    Neurogenic bladder    Benign essential HTN    Acute postoperative anemia due to expected blood loss    Central cord syndrome at C6 level of cervical spinal cord ([place]) 11/29/2017   Central cord syndrome ([place]) 11/29/2017   SCC (squamous cell carcinoma) 04/08/2014    ONSET DATE: 08/15/2021 (fall with B hip fx); Initial injury was in 2019- quadraplegia due to central cord syndrom of C6 secondary to neck fracture.   REFERRING DIAG: Bilateral Hip fx; Left Open- s/p Left IM nail femoral on 08/16/2021; Right closed  THERAPY DIAG:  Muscle weakness (generalized)  Central  cord syndrome, sequela (HCC)  Abnormality of gait and mobility  Difficulty in walking, not elsewhere classified  Other abnormalities of gait and mobility  Unsteadiness on feet  Closed fracture of both hips, initial encounter ([place])  Other lack of coordination  Rationale for Evaluation and Treatment Rehabilitation  SUBJECTIVE:                                                                                                                                                                                               SUBJECTIVE STATEMENT:  Patient reports being tired and just fatigued. Denies any pain.     Pt accompanied by:  Paid caregiver-   PERTINENT HISTORY: Patient was previous patient (see previos episode for specifics) She experienced a fall at home on 08/15/2021 and was in [facility] from 08/15/2021- 08/20/2021.  Most recent history per Dr. Laurey Arrow on 08/20/2021.  Sherry [name] is a 87 y.o. 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   Patient has history of quadriplegia, and has been doing PT/OT with improvement of function. Pt has some use of both arms and can feed herself. She can walk a little bit with assistance. Pt accidentally fell at about 930 this morning after taking shower.  She injured her hips. Hospital Course:    Closed left hip fracture [facility]) s/p mechanical fall Closed right hip fracture (?old) Acute blood loss anemia --pt is s/p  INTRAMEDULLARY (IM) NAIL FEMORAL-left on 7/16  PAIN:  Are you having pain? NO: NPRS scale: 0/10 Pain location: Right hip Pain description: Ache Aggravating factors: any active Movement of right side Relieving factors: Rest  PRECAUTIONS: Fall  WEIGHT BEARING RESTRICTIONS Yes Bilateral non-weight bearing B LE's  FALLS: Has patient fallen in last 6 months? Yes. Number of falls 1  LIVING ENVIRONMENT: Lives with: lives with their family and lives in a nursing home Lives in: Other Long term care at [place]: No Has following equipment at home: Wheelchair (power), Ramped entry, and hoyer lift  PLOF: Needs assistance with ADLs, Needs assistance with homemaking, Needs assistance with gait, Needs assistance with transfers, and Leisure: Work puzzles, read, Watch TV  PATIENT GOALS Patient goal is to walk; Be able  to strengthen my arms for better use  OBJECTIVE:   DIAGNOSTIC FINDINGS: Overall cognitive statusEXAM: DG HIP (WITH OR WITHOUT PELVIS) 2-3V LEFT; DG HIP (WITH OR WITHOUT PELVIS) 2-3V RIGHT   COMPARISON:  CT  of the abdomen and pelvis 01/21/2020   FINDINGS: Left proximal femur fracture noted just below the intertrochanteric region. Marked ventral angulation noted on the cross-table image. Slight varus angulation present   Comminuted intratrochanteric fracture present on the right also with marked ventral angulation.   IMPRESSION: 1. Bilateral hip fractures. 2. Comminuted intertrochanteric fracture of the right hip. 3. Angulated fracture of the proximal left femur just below the intertrochanteric region.     Electronically Signed   By: San Morelle M.D.   On: 08/15/2021 11:54:   COGNITION:  Within functional limits for tasks assessed   SENSATION: Light touch: Impaired   EDEMA:  None  Strength R/L 2-/2- Shoulder flexion (anterior deltoid/pec major/coracobrachialis, axillary n. (C5-6) and musculocutaneous n. (C5-7)) 2-/3+ (within available ROM around 130 on lef)Shoulder abduction (deltoid/supraspinatus, axillary/suprascapular n, C5) 3+/3+ Shoulder external rotation (infraspinatus/teres minor) 4/4 Shoulder internal rotation (subcapularis/lats/pec major) 3+/3+ Shoulder extension (posterior deltoid, lats, teres major, axillary/thoracodorsal n.) 3+/3+ Shoulder horizontal abduction 4/4 Elbow flexion (biceps brachii, brachialis, brachioradialis, musculoskeletal n, C5-6) 4/4 Elbow extension (triceps, radial n, C7) 2-/2- Wrist Extension 3+/3+ Wrist Flexion   LOWER EXTREMITY MMT:    MMT Right Eval Left Eval  Hip flexion    Hip extension    Hip abduction    Hip adduction    Hip internal rotation    Hip external rotation    Knee flexion    Knee extension    Ankle dorsiflexion    Ankle plantarflexion    Ankle inversion    Ankle eversion    (Blank rows = not  tested) - BLE not tested secondary to weight bearing precaution of B LE NWB  BED MOBILITY:  Unable to test due to NWB status of BLE- Patient currently dependent with all bed mobility using hoyer and caregiver assist  TRANSFERS: Currently BLE NWB- Dependent on use of hoyer for all transfers  FUNCTIONAL TESTs:   PATIENT SURVEYS:  FOTO 12  TODAY'S TREATMENT:   10/28/2021  Postural strengthening:   Horizontal Shoulder Abd BUE with Red TB 2 sets of 12 reps D2 PNF shoulder flex BUE with RTB 2 sets of 12 reps Chest press BUE with RTB 2 sets of 12 reps Scap row BUE with Red TB 2 sets of 12 reps Bicep curl BUE with RTB 2 sets of 12 reps Tricep press down BUE with RTB TB 2 sets of 12 reps Shoulder ext with RTB 2 sets of 12 reps   PATIENT EDUCATION: Education details: PT plan of care/use of resistive bands Person educated: Patient Education method: Explanation, Demonstration, Tactile cues, and Verbal cues Education comprehension: verbalized understanding, returned demonstration, verbal cues required, tactile cues required, and needs further education   HOME EXERCISE PROGRAM: Discussed use of theraband and demo shoulder flex/abd and elbow flex and ext with use of yellow TB. Patient required assist to position therband but able to perform well with only one person assist    GOALS: Goals reviewed with patient? Yes  SHORT TERM GOALS: Target date: 12/09/2021  Pt will be independent with initial UE strengthening HEP in order to improve strength and balance in order to decrease fall risk and improve function at home and work. Baseline: 10/19/2021- patient with no formal UE HEP Goal status: INITIAL  LONG TERM GOALS: Target date: 01/20/2022  Pt will be independent with final for UE/LE HEP in order to improve strength and balance in order to decrease fall risk and improve function at home and work. Baseline: Patient is currently BLE NWB  and unable to participate in HEP. Has order for UE  strengthening. Goal status: INITIAL  2.  Pt will improve FOTO to target score of 35  to display perceived improvements in ability to complete ADL's.  Baseline: 10/19/2021= 12  Goal status: INITIAL  3.  Pt will increase strength of B UE  by at least 1/2 MMT grade in order to demonstrate improvement in strength and function  Baseline: patient has range of 2-/5 to 4/5 BUE Strength Goal status: INITIAL  ASSESSMENT:  CLINICAL IMPRESSION: Patient able to progress with resistance today with postural strengthening- able to progress to red theraband and reported increased difficulty yet able to complete with good form. Only VC for correct technique and some physical assisst to wrap band around wrist/hands today.  Patient will benefit from skilled PT services to improve her overall strength. Will update and add more mobility goals once patient is cleared for some weight bearing activities.          OBJECTIVE IMPAIRMENTS Abnormal gait, decreased activity tolerance, decreased balance, decreased coordination, decreased endurance, decreased mobility, difficulty walking, decreased ROM, decreased strength, hypomobility, impaired flexibility, impaired UE functional use, postural dysfunction, and pain.   ACTIVITY LIMITATIONS carrying, lifting, bending, sitting, standing, squatting, sleeping, stairs, transfers, bed mobility, continence, bathing, toileting, dressing, self feeding, reach over head, hygiene/grooming, and caring for others  PARTICIPATION LIMITATIONS: meal prep, cleaning, laundry, medication management, personal finances, interpersonal relationship, driving, shopping, community activity, and yard work  PERSONAL FACTORS Age, Time since onset of injury/illness/exacerbation, and 1-2 comorbidities: HTN, cervical Sx  are also affecting patient's functional outcome.   REHAB POTENTIAL: Good  CLINICAL DECISION MAKING: Evolving/moderate complexity  EVALUATION COMPLEXITY: Moderate  PLAN: PT  FREQUENCY: 2x/week  PT DURATION: 12 weeks  PLANNED INTERVENTIONS: Therapeutic exercises, Therapeutic activity, Neuromuscular re-education, Balance training, Gait training, Patient/Family education, Self Care, Joint mobilization, DME instructions, Dry Needling, Electrical stimulation, Wheelchair mobility training, Spinal mobilization, Cryotherapy, Moist heat, and Manual therapy  PLAN FOR NEXT SESSION: Instruct in home program for UE strengthening   Lewis Moccasin, PT 10/28/2021, 12:53 PM

## 2021-11-02 ENCOUNTER — Ambulatory Visit: Payer: Medicare HMO

## 2021-11-02 ENCOUNTER — Ambulatory Visit: Payer: Medicare HMO | Attending: Internal Medicine | Admitting: Occupational Therapy

## 2021-11-02 ENCOUNTER — Encounter: Payer: Self-pay | Admitting: Occupational Therapy

## 2021-11-02 DIAGNOSIS — S72002A Fracture of unspecified part of neck of left femur, initial encounter for closed fracture: Secondary | ICD-10-CM | POA: Diagnosis present

## 2021-11-02 DIAGNOSIS — R262 Difficulty in walking, not elsewhere classified: Secondary | ICD-10-CM | POA: Diagnosis present

## 2021-11-02 DIAGNOSIS — S72001A Fracture of unspecified part of neck of right femur, initial encounter for closed fracture: Secondary | ICD-10-CM | POA: Diagnosis present

## 2021-11-02 DIAGNOSIS — S14129S Central cord syndrome at unspecified level of cervical spinal cord, sequela: Secondary | ICD-10-CM

## 2021-11-02 DIAGNOSIS — R2689 Other abnormalities of gait and mobility: Secondary | ICD-10-CM

## 2021-11-02 DIAGNOSIS — M6281 Muscle weakness (generalized): Secondary | ICD-10-CM | POA: Insufficient documentation

## 2021-11-02 DIAGNOSIS — R278 Other lack of coordination: Secondary | ICD-10-CM | POA: Insufficient documentation

## 2021-11-02 DIAGNOSIS — R269 Unspecified abnormalities of gait and mobility: Secondary | ICD-10-CM

## 2021-11-02 DIAGNOSIS — R2681 Unsteadiness on feet: Secondary | ICD-10-CM

## 2021-11-02 NOTE — Therapy (Addendum)
Occupational Therapy Treatment Note    Patient Name: Sherry [name] MRN: [id number] DOB:07-25-1934, 87 y.o., female Today's Date: 11/02/2021                                                                                                                                                         PCP: Dr. Viviana Simpler REFERRING PROVIDER: Dr. Viviana Simpler   OT End of Session - 11/02/21 1321     Visit Number 11    Number of Visits 24    Date for OT Re-Evaluation 12/07/21    Authorization Time Period Progress report period starting 09/14/2021    OT Start Time 1100    OT Stop Time 1145    OT Time Calculation (min) 45 min    Activity Tolerance Patient tolerated treatment well    Behavior During Therapy [facility] for tasks assessed/performed             Past Medical History:  Diagnosis Date   Acute blood loss anemia    Arthritis    Cancer ([place])    skin   Central cord syndrome at C6 level of cervical spinal cord ([place]) 11/29/2017   Hypertension    Protein-calorie malnutrition, severe ([place]) 01/24/2018   S/P insertion of IVC (inferior vena caval) filter 01/24/2018   Tetraparesis ([place])    Past Surgical History:  Procedure Laterality Date   ANTERIOR CERVICAL DECOMP/DISCECTOMY FUSION N/A 11/29/2017   Procedure: Cervical five-six, six-seven Anterior Cervical Decompression Fusion;  Surgeon: Judith Part, MD;  Location: [place];  Service: Neurosurgery;  Laterality: N/A;  Cervical five-six, six-seven Anterior Cervical Decompression Fusion   CATARACT EXTRACTION     FEMUR IM NAIL Left 08/16/2021   Procedure: INTRAMEDULLARY (IM) NAIL FEMORAL;  Surgeon: Earnestine Leys, MD;  Location: [facility];  Service: Orthopedics;  Laterality: Left;   IR IVC FILTER PLMT / S&I /IMG GUID/MOD SED  12/08/2017   Patient Active Problem List   Diagnosis Date Noted   Closed hip fracture ([place]) 08/15/2021   DVT (deep venous thrombosis) ([place]) 08/15/2021   Quadriplegia ([place]) 08/15/2021   Fall 08/15/2021   Acute  appendicitis with appendiceal abscess 05/30/2018   Hypocalcemia 02/15/2018   Dysuria 02/15/2018   Bilateral lower extremity edema 02/11/2018   Vaginal yeast infection 01/30/2018   Chest tightness 01/27/2018   Healthcare-associated pneumonia 01/25/2018   Pleural effusion, not elsewhere classified 01/25/2018   Acute deep vein thrombosis (DVT) of left lower extremity ([place]) 01/24/2018   Acute deep vein thrombosis (DVT) of right lower extremity ([place]) 01/24/2018   Chronic allergic rhinitis 01/24/2018   Depression with anxiety 01/24/2018   UTI due to Klebsiella species 01/24/2018   Protein-calorie malnutrition, severe ([place]) 01/24/2018   S/P insertion of IVC (inferior vena caval) filter 01/24/2018   Reactive depression    Hypokalemia  Leukocytosis    Essential hypertension    Trauma    Tetraparesis (HCC)    Neuropathic pain    Neurogenic bowel    Neurogenic bladder    Benign essential HTN    Acute postoperative anemia due to expected blood loss    Central cord syndrome at C6 level of cervical spinal cord ([place]) 11/29/2017   Central cord syndrome ([place]) 11/29/2017   SCC (squamous cell carcinoma) 04/08/2014      REFERRING DIAG: Central Cord Syndrome at C6 of the Cervical Spinal Cord, Fall with Bilateral Closed Hip Fractures, with ORIF repair with Intramedullary Nailing of the Left Hip Fracture.  THERAPY DIAG:  Muscle weakness (generalized)  Other lack of coordination  Rationale for Evaluation and Treatment Rehabilitation  PERTINENT HISTORY: Patient s/p fall November 29, 2017 resulting in diagnosis of central cord syndrome at C 6 level.  She has had therapy in multiple venues but with recent move to [facility], therapy staff recommended she seek outpatient therapy for her needs. Pt. Had a recent fall with Bilateral Closed Hip Fractures, with ORIF repair with Intramedullary Nailing of the Left Hip Fracture.   PRECAUTIONS: None  SUBJECTIVE:   Pt. reports having had a good weekend.  Pt. reports that they have family in town as her husband has a birthday this week.   PAIN:  Are you having pain? No     OBJECTIVE:   TODAY'S TREATMENT:    Manual Therapy:   Pt. tolerated soft tissue massage to the volar, and dorsal surface of each digit on the bilateral hands secondary to stiffness. Manual therapy was performed independent of, and in preparation for therapeutic Ex.   Therapeutic Ex.    Pt. worked on [employer], and reciprocal motion using the UBE while seated for 8 min. with minimal resistance. Constant monitoring was provided. Pt. tolerated PROM followed by AROM bilateral wrist extension, PROM for bilateral digit MP, PIP, and DIP flexion, and extension, thumb radial, and palmar abduction in conjunction with moist heat. Pt. Worked on Bilateral AROM for the wrist using the handheld wrist maze to increase AROM through multiple movement patterns.    Pt. tolerated manual therapy, and UE ther. ex well today without reports of pain following moist heat modality to the bilateral shoulders, and hands. Pt. required cues for visual demonstration with the wrist maze. Pt. presents with tightness at the bilateral thumb webspace, and bilateral wrist flexor tightness, as well as digit MP, PIP, and DIP extensor tightness which continues to limit her ability to complete ADL tasks efficiently. Pt. continues to benefit from working on impoving ROM in order to work towards increasing engagement of her bilateral hands during ADLs, and IADL tasks.    PATIENT EDUCATION: Education details: BUE ROM, hand function, UE functioning Person educated: Patient Education method: Explanation and Demonstration Education comprehension: verbalized understanding, returned demonstration, and needs further education   HOME EXERCISE PROGRAM Continue with ongoing HEPs     OT Long Term Goals - 07/06/21 1106  Target Date: 12/07/2021    OT LONG TERM GOAL #1   Title Patient and caregiver will demonstrate understanding of home exercise program for ROM.    Baseline 10/28/2021: Pt. Is attempting exercises/stretches at home. The facility restorative aides have not resumed exercises. 09/14/2021:  Pt. Reports inconsistencies with restorative ROM at  her care home. Pt/caregiver education to be provided about ongoing ROM needs.    Time 12    Period Weeks    Status ongoing     OT LONG TERM GOAL #2   Title Pt.  Will improve BUE strength to be able to sustain her UE's in elevation to efficiently, and effectively perform hair care, and self-grooming tasks.   Baseline 10/28/2021: Right shoulder 3+/5, Left shoulder 4-/5 09/14/2021:  BUE shoulder strength: 3+/5 for shoulder flexion, and abduction.   Time 12    Period Weeks    Status Ongoing               OT LONG TERM GOAL #3   Title Patient will demonstrate improved composite finger flexion to hold deodorant to apply to underarms.    Baseline 10/28/2021:  Pt. Continues to have difficulty holding the deodorant. 01/12/2021: Pt. Presents with limited digit extension. Pt. Is able to initiate holding deodorant, however is unable to hold it while using it.   Time 12    Period Weeks    Status Ongoing   Target Date                  OT LONG TERM GOAL #4   Title Pt. Will improve bilateral wrist extension in preparation for anticipating, and initiating reaching for objects at the table.    Baseline 10/28/2021:  Right 22(38), Left 0(15) 09/14/2021:  Right: 17(35), left 2(15)   Time 12   Period weeks   Status Ongoing   Target Date      OT LONG TERM GOAL #5   Title Pt. will write, and sign her name with 100% legibility, and modified independence.    Baseline 10/28/2021: pt. is able to sign her name, however legibility continues to be inconsistent. Pt. Reports "shaky appearance". 09/14/2021: Pt. continues to fill out her daily menu and complete puzzles. Pt. Continues to  present with limited writing legibility.   Time 12    Period Weeks    Status  New          OT LONG TERM GOAL  #6   TITLE Pt. will turn pages in a book with modified independence.    Baseline 10/28/2021: Pt. Is able to consistently turn book pages. 09/14/2021: Pt. has difficulty turning pages.    Time 12    Period Weeks    Status Achieved                 Plan - 07/22/21 1737     Clinical Impression Statement Pt. tolerated manual therapy, and UE ther. ex well today without reports of pain following moist heat modality to the bilateral shoulders, and hands. Pt. required cues for visual demonstration with the wrist maze. Pt. presents with tightness at the bilateral thumb webspace, and bilateral wrist flexor tightness, as well as digit MP, PIP, and DIP extensor tightness which continues to limit her ability to complete ADL tasks efficiently. Pt. continues to benefit from working on impoving ROM in order to work towards increasing engagement of her bilateral hands during ADLs, and IADL tasks.  OT Occupational Profile and History Detailed Assessment- Review of Records and additional review of physical, cognitive, psychosocial history related to current functional performance    Occupational performance deficits (Please refer to evaluation for details): ADL's;IADL's;Leisure;Social Participation    Body Structure / Function / Physical Skills ADL;Continence;Dexterity;Flexibility;Strength;ROM;Balance;Coordination;FMC;IADL;Endurance;UE functional use;Decreased knowledge of use of DME;[facility]    Psychosocial Skills Coping Strategies    Rehab Potential Fair    Clinical Decision Making Several treatment options, min-mod task modification necessary    Comorbidities Affecting Occupational Performance: Presence of comorbidities impacting occupational performance    Comorbidities impacting occupational performance description: contractures of bilateral hands, dependent transfers,     Modification or Assistance to Complete Evaluation  No modification of tasks or assist necessary to complete eval    OT Frequency 2x / week    OT Duration 12 weeks    OT Treatment/Interventions Self-care/ADL training;Cryotherapy;Therapeutic exercise;DME and/or AE instruction;Balance training;Neuromuscular education;Manual Therapy;Splinting;Moist Heat;Passive range of motion;Therapeutic activities;Patient/family education    Plan continue to progress ROM of digits, wrists and shoulders as it pertains to completion of ADL tasks that pt values.    Consulted and Agree with Plan of Care Patient            Harrel Carina, MS, OTR/L  Harrel Carina, OT 11/02/2021, 1:31 PM

## 2021-11-02 NOTE — Therapy (Signed)
OUTPATIENT PHYSICAL THERAPY NEURO TREATMENT   Patient Name: MALLIKA SANMIGUEL MRN: [id number] DOB:[birth year], 87 y.o., female Today's Date: 11/02/2021   PCP: Dewayne Shorter, MD REFERRING PROVIDER: Earnestine Leys   PT End of Session - 11/02/21 1256     Visit Number 4    Number of Visits 24    Date for PT Re-Evaluation 01/11/22    Authorization Time Period Initial cert 06/09/3265-12/45/8099    Progress Note Due on Visit 10    PT Start Time 1146    PT Stop Time 1219    PT Time Calculation (min) 33 min    Equipment Utilized During Treatment Gait belt    Activity Tolerance Patient tolerated treatment well;Patient limited by fatigue    Behavior During Therapy WFL for tasks assessed/performed              Past Medical History:  Diagnosis Date   Acute blood loss anemia    Arthritis    Cancer ([place])    skin   Central cord syndrome at C6 level of cervical spinal cord ([place]) 11/29/2017   Hypertension    Protein-calorie malnutrition, severe ([place]) 01/24/2018   S/P insertion of IVC (inferior vena caval) filter 01/24/2018   Tetraparesis ([place])    Past Surgical History:  Procedure Laterality Date   ANTERIOR CERVICAL DECOMP/DISCECTOMY FUSION N/A 11/29/2017   Procedure: Cervical five-six, six-seven Anterior Cervical Decompression Fusion;  Surgeon: Judith Part, MD;  Location: [place];  Service: Neurosurgery;  Laterality: N/A;  Cervical five-six, six-seven Anterior Cervical Decompression Fusion   CATARACT EXTRACTION     FEMUR IM NAIL Left 08/16/2021   Procedure: INTRAMEDULLARY (IM) NAIL FEMORAL;  Surgeon: Earnestine Leys, MD;  Location: [facility];  Service: Orthopedics;  Laterality: Left;   IR IVC FILTER PLMT / S&I /IMG GUID/MOD SED  12/08/2017   Patient Active Problem List   Diagnosis Date Noted   Closed hip fracture ([place]) 08/15/2021   DVT (deep venous thrombosis) ([place]) 08/15/2021   Quadriplegia ([place]) 08/15/2021   Fall 08/15/2021   Acute appendicitis with appendiceal abscess  05/30/2018   Hypocalcemia 02/15/2018   Dysuria 02/15/2018   Bilateral lower extremity edema 02/11/2018   Vaginal yeast infection 01/30/2018   Chest tightness 01/27/2018   Healthcare-associated pneumonia 01/25/2018   Pleural effusion, not elsewhere classified 01/25/2018   Acute deep vein thrombosis (DVT) of left lower extremity ([place]) 01/24/2018   Acute deep vein thrombosis (DVT) of right lower extremity ([place]) 01/24/2018   Chronic allergic rhinitis 01/24/2018   Depression with anxiety 01/24/2018   UTI due to Klebsiella species 01/24/2018   Protein-calorie malnutrition, severe ([place]) 01/24/2018   S/P insertion of IVC (inferior vena caval) filter 01/24/2018   Reactive depression    Hypokalemia    Leukocytosis    Essential hypertension    Trauma    Tetraparesis (HCC)    Neuropathic pain    Neurogenic bowel    Neurogenic bladder    Benign essential HTN    Acute postoperative anemia due to expected blood loss    Central cord syndrome at C6 level of cervical spinal cord ([place]) 11/29/2017   Central cord syndrome ([place]) 11/29/2017   SCC (squamous cell carcinoma) 04/08/2014    ONSET DATE: 08/15/2021 (fall with B hip fx); Initial injury was in 2019- quadraplegia due to central cord syndrom of C6 secondary to neck fracture.   REFERRING DIAG: Bilateral Hip fx; Left Open- s/p Left IM nail femoral on 08/16/2021; Right closed  THERAPY DIAG:  Muscle weakness (generalized)  Central cord syndrome, sequela (HCC)  Abnormality of gait and mobility  Difficulty in walking, not elsewhere classified  Other abnormalities of gait and mobility  Unsteadiness on feet  Closed fracture of both hips, initial encounter ([place])  Other lack of coordination  Rationale for Evaluation and Treatment Rehabilitation  SUBJECTIVE:                                                                                                                                                                                               SUBJECTIVE STATEMENT:   Patient reports doing okay and another uneventful weekend. Denies any pain or any falls.     Pt accompanied by:  Paid caregiver-   PERTINENT HISTORY: Patient was previous patient (see previos episode for specifics) She experienced a fall at home on 08/15/2021 and was in [facility] from 08/15/2021- 08/20/2021.  Most recent history per Dr. Laurey Arrow on 08/20/2021.  Seraphina A Ricci is a 87 y.o. 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   Patient has history of quadriplegia, and has been doing PT/OT with improvement of function. Pt has some use of both arms and can feed herself. She can walk a little bit with assistance. Pt accidentally fell at about 930 this morning after taking shower.  She injured her hips. Hospital Course:    Closed left hip fracture [facility]) s/p mechanical fall Closed right hip fracture (?old) Acute blood loss anemia --pt is s/p  INTRAMEDULLARY (IM) NAIL FEMORAL-left on 7/16  PAIN:  Are you having pain? NO: NPRS scale: 0/10 Pain location: Right hip Pain description: Ache Aggravating factors: any active Movement of right side Relieving factors: Rest  PRECAUTIONS: Fall  WEIGHT BEARING RESTRICTIONS Yes Bilateral non-weight bearing B LE's  FALLS: Has patient fallen in last 6 months? Yes. Number of falls 1  LIVING ENVIRONMENT: Lives with: lives with their family and lives in a nursing home Lives in: Other Long term care at [facility]: No Has following equipment at home: Wheelchair (power), Ramped entry, and hoyer lift  PLOF: Needs assistance with ADLs, Needs assistance with homemaking, Needs assistance with gait, Needs assistance with transfers, and Leisure: Work puzzles, read, Watch TV  PATIENT GOALS Patient  goal is to walk; Be able to strengthen my arms for better use  OBJECTIVE:   DIAGNOSTIC FINDINGS: Overall cognitive statusEXAM: DG HIP (WITH OR WITHOUT PELVIS) 2-3V LEFT; DG HIP (WITH OR WITHOUT PELVIS) 2-3V  RIGHT   COMPARISON:  CT of the abdomen and pelvis 01/21/2020   FINDINGS: Left proximal femur fracture noted just below the intertrochanteric region. Marked ventral angulation noted on the cross-table image. Slight varus angulation present   Comminuted intratrochanteric fracture present on the right also with marked ventral angulation.   IMPRESSION: 1. Bilateral hip fractures. 2. Comminuted intertrochanteric fracture of the right hip. 3. Angulated fracture of the proximal left femur just below the intertrochanteric region.     Electronically Signed   By: San Morelle M.D.   On: 08/15/2021 11:54:   COGNITION:  Within functional limits for tasks assessed   SENSATION: Light touch: Impaired   EDEMA:  None  Strength R/L 2-/2- Shoulder flexion (anterior deltoid/pec major/coracobrachialis, axillary n. (C5-6) and musculocutaneous n. (C5-7)) 2-/3+ (within available ROM around 130 on lef)Shoulder abduction (deltoid/supraspinatus, axillary/suprascapular n, C5) 3+/3+ Shoulder external rotation (infraspinatus/teres minor) 4/4 Shoulder internal rotation (subcapularis/lats/pec major) 3+/3+ Shoulder extension (posterior deltoid, lats, teres major, axillary/thoracodorsal n.) 3+/3+ Shoulder horizontal abduction 4/4 Elbow flexion (biceps brachii, brachialis, brachioradialis, musculoskeletal n, C5-6) 4/4 Elbow extension (triceps, radial n, C7) 2-/2- Wrist Extension 3+/3+ Wrist Flexion   LOWER EXTREMITY MMT:    MMT Right Eval Left Eval  Hip flexion    Hip extension    Hip abduction    Hip adduction    Hip internal rotation    Hip external rotation    Knee flexion    Knee extension    Ankle dorsiflexion    Ankle plantarflexion    Ankle inversion    Ankle  eversion    (Blank rows = not tested) - BLE not tested secondary to weight bearing precaution of B LE NWB  BED MOBILITY:  Unable to test due to NWB status of BLE- Patient currently dependent with all bed mobility using hoyer and caregiver assist  TRANSFERS: Currently BLE NWB- Dependent on use of hoyer for all transfers  FUNCTIONAL TESTs:   PATIENT SURVEYS:  FOTO 12  TODAY'S TREATMENT:   11/02/2021  Postural and UE strengthening:   Horizontal Shoulder Abd BUE into bear hug with no resistance 1 set of 20 reps D2 PNF shoulder flex BUE (active) x 20 reps Chest press BUE with RTB 2 sets of 12 reps Scap row BUE with Red TB 2 sets of 12 reps Bicep curl BUE with RTB 2 sets of 12 reps Tricep press down BUE with RTB TB 2 sets of 12 reps Ant  delt shoulder flex  x 20 reps Middle delt shoulder abd x 16 reps- (fatigued on right)  Shoulder ext with RTB 2 sets of 12 reps  Posterior shoulder roll x 20 reps Active shoulder shrug into shoulder depression x 20 reps     PATIENT EDUCATION: Education details: PT plan of care/use of resistive bands Person educated: Patient Education method: Explanation, Demonstration, Tactile cues, and Verbal cues Education comprehension: verbalized understanding, returned demonstration, verbal cues required, tactile cues required, and needs further education   HOME EXERCISE PROGRAM: Discussed use of theraband and demo shoulder flex/abd and elbow flex and ext with use of yellow TB. Patient required assist to position therband but able to perform well with only one person assist    GOALS: Goals reviewed with patient? Yes  SHORT TERM GOALS: Target date: 12/14/2021  Pt will be independent with initial UE strengthening HEP in order to improve strength and balance in order to decrease fall risk and improve function at home and work. Baseline: 10/19/2021- patient with no formal UE HEP  Goal status: INITIAL  LONG TERM GOALS: Target date: 01/25/2022  Pt will  be independent with final for UE/LE HEP in order to improve strength and balance in order to decrease fall risk and improve function at home and work. Baseline: Patient is currently BLE NWB and unable to participate in HEP. Has order for UE strengthening. Goal status: INITIAL  2.  Pt will improve FOTO to target score of 35  to display perceived improvements in ability to complete ADL's.  Baseline: 10/19/2021= 12  Goal status: INITIAL  3.  Pt will increase strength of B UE  by at least 1/2 MMT grade in order to demonstrate improvement in strength and function  Baseline: patient has range of 2-/5 to 4/5 BUE Strength Goal status: INITIAL  ASSESSMENT:  CLINICAL IMPRESSION: Patient presents with good motivation. She was able to add more UE exercises without significant issues. She did demo some left tricep fatigue with ext exercises and some right UE shoulder weakness. She requires assist with bands to position and use correctly. Patient will benefit from skilled PT services to improve her overall strength. Will update and add more mobility goals once patient is cleared for some weight bearing activities.          OBJECTIVE IMPAIRMENTS Abnormal gait, decreased activity tolerance, decreased balance, decreased coordination, decreased endurance, decreased mobility, difficulty walking, decreased ROM, decreased strength, hypomobility, impaired flexibility, impaired UE functional use, postural dysfunction, and pain.   ACTIVITY LIMITATIONS carrying, lifting, bending, sitting, standing, squatting, sleeping, stairs, transfers, bed mobility, continence, bathing, toileting, dressing, self feeding, reach over head, hygiene/grooming, and caring for others  PARTICIPATION LIMITATIONS: meal prep, cleaning, laundry, medication management, personal finances, interpersonal relationship, driving, shopping, community activity, and yard work  PERSONAL FACTORS Age, Time since onset of injury/illness/exacerbation, and  1-2 comorbidities: HTN, cervical Sx  are also affecting patient's functional outcome.   REHAB POTENTIAL: Good  CLINICAL DECISION MAKING: Evolving/moderate complexity  EVALUATION COMPLEXITY: Moderate  PLAN: PT FREQUENCY: 2x/week  PT DURATION: 12 weeks  PLANNED INTERVENTIONS: Therapeutic exercises, Therapeutic activity, Neuromuscular re-education, Balance training, Gait training, Patient/Family education, Self Care, Joint mobilization, DME instructions, Dry Needling, Electrical stimulation, Wheelchair mobility training, Spinal mobilization, Cryotherapy, Moist heat, and Manual therapy  PLAN FOR NEXT SESSION: Instruct in home program for UE strengthening   Lewis Moccasin, PT 11/02/2021, 1:02 PM

## 2021-11-04 ENCOUNTER — Ambulatory Visit: Payer: Medicare HMO | Admitting: Occupational Therapy

## 2021-11-04 ENCOUNTER — Ambulatory Visit: Payer: Medicare HMO

## 2021-11-09 ENCOUNTER — Encounter: Payer: Self-pay | Admitting: Physical Therapy

## 2021-11-09 ENCOUNTER — Encounter: Payer: Self-pay | Admitting: Occupational Therapy

## 2021-11-09 ENCOUNTER — Ambulatory Visit: Payer: Medicare HMO | Admitting: Occupational Therapy

## 2021-11-09 ENCOUNTER — Ambulatory Visit: Payer: Medicare HMO

## 2021-11-09 DIAGNOSIS — S72002A Fracture of unspecified part of neck of left femur, initial encounter for closed fracture: Secondary | ICD-10-CM

## 2021-11-09 DIAGNOSIS — S14129S Central cord syndrome at unspecified level of cervical spinal cord, sequela: Secondary | ICD-10-CM

## 2021-11-09 DIAGNOSIS — S72001A Fracture of unspecified part of neck of right femur, initial encounter for closed fracture: Secondary | ICD-10-CM

## 2021-11-09 DIAGNOSIS — R2689 Other abnormalities of gait and mobility: Secondary | ICD-10-CM

## 2021-11-09 DIAGNOSIS — M6281 Muscle weakness (generalized): Secondary | ICD-10-CM

## 2021-11-09 DIAGNOSIS — R262 Difficulty in walking, not elsewhere classified: Secondary | ICD-10-CM

## 2021-11-09 DIAGNOSIS — R269 Unspecified abnormalities of gait and mobility: Secondary | ICD-10-CM

## 2021-11-09 NOTE — Therapy (Signed)
OUTPATIENT PHYSICAL THERAPY NEURO TREATMENT   Patient Name: Sherry [name] MRN: [id number] DOB:[birth year], 87 y.o., female Today's Date: 11/09/2021   PCP: Dewayne Shorter, MD REFERRING PROVIDER: Earnestine Leys   PT End of Session - 11/09/21 1148     Visit Number 5    Number of Visits 24    Date for PT Re-Evaluation 01/11/22    Authorization Time Period Initial cert [id number]    Progress Note Due on Visit 10    PT Start Time 1147    PT Stop Time 1220    PT Time Calculation (min) 33 min    Equipment Utilized During Treatment Gait belt    Activity Tolerance Patient tolerated treatment well;Patient limited by fatigue    Behavior During Therapy WFL for tasks assessed/performed              Past Medical History:  Diagnosis Date   Acute blood loss anemia    Arthritis    Cancer ([place])    skin   Central cord syndrome at C6 level of cervical spinal cord ([place]) 11/29/2017   Hypertension    Protein-calorie malnutrition, severe ([place]) 01/24/2018   S/P insertion of IVC (inferior vena caval) filter 01/24/2018   Tetraparesis ([place])    Past Surgical History:  Procedure Laterality Date   ANTERIOR CERVICAL DECOMP/DISCECTOMY FUSION N/A 11/29/2017   Procedure: Cervical five-six, six-seven Anterior Cervical Decompression Fusion;  Surgeon: Judith Part, MD;  Location: [place];  Service: Neurosurgery;  Laterality: N/A;  Cervical five-six, six-seven Anterior Cervical Decompression Fusion   CATARACT EXTRACTION     FEMUR IM NAIL Left 08/16/2021   Procedure: INTRAMEDULLARY (IM) NAIL FEMORAL;  Surgeon: Earnestine Leys, MD;  Location: [facility];  Service: Orthopedics;  Laterality: Left;   IR IVC FILTER PLMT / S&I /IMG GUID/MOD SED  12/08/2017   Patient Active Problem List   Diagnosis Date Noted   Closed hip fracture ([place]) 08/15/2021   DVT (deep venous thrombosis) ([place]) 08/15/2021   Quadriplegia ([place]) 08/15/2021   Fall 08/15/2021   Acute appendicitis with appendiceal abscess  05/30/2018   Hypocalcemia 02/15/2018   Dysuria 02/15/2018   Bilateral lower extremity edema 02/11/2018   Vaginal yeast infection 01/30/2018   Chest tightness 01/27/2018   Healthcare-associated pneumonia 01/25/2018   Pleural effusion, not elsewhere classified 01/25/2018   Acute deep vein thrombosis (DVT) of left lower extremity ([place]) 01/24/2018   Acute deep vein thrombosis (DVT) of right lower extremity ([place]) 01/24/2018   Chronic allergic rhinitis 01/24/2018   Depression with anxiety 01/24/2018   UTI due to Klebsiella species 01/24/2018   Protein-calorie malnutrition, severe ([place]) 01/24/2018   S/P insertion of IVC (inferior vena caval) filter 01/24/2018   Reactive depression    Hypokalemia    Leukocytosis    Essential hypertension    Trauma    Tetraparesis (HCC)    Neuropathic pain    Neurogenic bowel    Neurogenic bladder    Benign essential HTN    Acute postoperative anemia due to expected blood loss    Central cord syndrome at C6 level of cervical spinal cord ([place]) 11/29/2017   Central cord syndrome ([place]) 11/29/2017   SCC (squamous cell carcinoma) 04/08/2014    ONSET DATE: 08/15/2021 (fall with B hip fx); Initial injury was in 2019- quadraplegia due to central cord syndrom of C6 secondary to neck fracture.   REFERRING DIAG: Bilateral Hip fx; Left Open- s/p Left IM nail femoral on 08/16/2021; Right closed  THERAPY DIAG:  Muscle weakness (generalized)  Central cord syndrome, sequela (HCC)  Abnormality of gait and mobility  Difficulty in walking, not elsewhere classified  Other abnormalities of gait and mobility  Closed fracture of both hips, initial encounter ([place])  Rationale for Evaluation and Treatment Rehabilitation  SUBJECTIVE:                                                                                                                                                                                              SUBJECTIVE STATEMENT:   Pt reports no new  concerns or complaints. No falls.     Pt accompanied by:  Paid caregiver-   PERTINENT HISTORY: Patient was previous patient (see previos episode for specifics) She experienced a fall at home on 08/15/2021 and was in [facility] from 08/15/2021- 08/20/2021.  Most recent history per Dr. Laurey Arrow on 08/20/2021.  Sherry [name] is a 87 y.o. 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   Patient has history of quadriplegia, and has been doing PT/OT with improvement of function. Pt has some use of both arms and can feed herself. She can walk a little bit with assistance. Pt accidentally fell at about 930 this morning after taking shower.  She injured her hips. Hospital Course:    Closed left hip fracture [facility]) s/p mechanical fall Closed right hip fracture (?old) Acute blood loss anemia --pt is s/p  INTRAMEDULLARY (IM) NAIL FEMORAL-left on 7/16  PAIN:  Are you having pain? NO: NPRS scale: 0/10 Pain location: Right hip Pain description: Ache Aggravating factors: any active Movement of right side Relieving factors: Rest  PRECAUTIONS: Fall  WEIGHT BEARING RESTRICTIONS Yes Bilateral non-weight bearing B LE's  FALLS: Has patient fallen in last 6 months? Yes. Number of falls 1  LIVING ENVIRONMENT: Lives with: lives with their family and lives in a nursing home Lives in: Other Long term care at [facility]: No Has following equipment at home: Wheelchair (power), Ramped entry, and hoyer lift  PLOF: Needs assistance with ADLs, Needs assistance with homemaking, Needs assistance with gait, Needs assistance with transfers, and Leisure: Work puzzles, read, Watch TV  PATIENT GOALS Patient goal is to walk; Be able to strengthen my arms for better use  OBJECTIVE:    DIAGNOSTIC FINDINGS: Overall cognitive statusEXAM: DG HIP (WITH OR WITHOUT PELVIS) 2-3V LEFT; DG HIP (WITH OR WITHOUT PELVIS) 2-3V RIGHT   COMPARISON:  CT of the abdomen and pelvis 01/21/2020  FINDINGS: Left proximal femur fracture noted just below the intertrochanteric region. Marked ventral angulation noted on the cross-table image. Slight varus angulation present   Comminuted intratrochanteric fracture present on the right also with marked ventral angulation.   IMPRESSION: 1. Bilateral hip fractures. 2. Comminuted intertrochanteric fracture of the right hip. 3. Angulated fracture of the proximal left femur just below the intertrochanteric region.     Electronically Signed   By: San Morelle M.D.   On: 08/15/2021 11:54:   COGNITION:  Within functional limits for tasks assessed   SENSATION: Light touch: Impaired   EDEMA:  None  Strength R/L 2-/2- Shoulder flexion (anterior deltoid/pec major/coracobrachialis, axillary n. (C5-6) and musculocutaneous n. (C5-7)) 2-/3+ (within available ROM around 130 on lef)Shoulder abduction (deltoid/supraspinatus, axillary/suprascapular n, C5) 3+/3+ Shoulder external rotation (infraspinatus/teres minor) 4/4 Shoulder internal rotation (subcapularis/lats/pec major) 3+/3+ Shoulder extension (posterior deltoid, lats, teres major, axillary/thoracodorsal n.) 3+/3+ Shoulder horizontal abduction 4/4 Elbow flexion (biceps brachii, brachialis, brachioradialis, musculoskeletal n, C5-6) 4/4 Elbow extension (triceps, radial n, C7) 2-/2- Wrist Extension 3+/3+ Wrist Flexion   LOWER EXTREMITY MMT:    MMT Right Eval Left Eval  Hip flexion    Hip extension    Hip abduction    Hip adduction    Hip internal rotation    Hip external rotation    Knee flexion    Knee extension    Ankle dorsiflexion    Ankle plantarflexion    Ankle inversion    Ankle eversion    (Blank rows = not tested) - BLE not tested secondary to weight  bearing precaution of B LE NWB  BED MOBILITY:  Unable to test due to NWB status of BLE- Patient currently dependent with all bed mobility using hoyer and caregiver assist  TRANSFERS: Currently BLE NWB- Dependent on use of hoyer for all transfers  FUNCTIONAL TESTs:   PATIENT SURVEYS:  FOTO 12  TODAY'S TREATMENT:  11/09/2021 Postural and UE strengthening:    Horizontal Shoulder Abd BUE into bear hug with no resistance 1 set of 20 reps  D2 PNF shoulder flex BUE (active) 2x10, RTB. 1 set on RUE without RTB due to R shoulder weakness noted on first set with RTB.   Scap row BUE with Red TB 3x12  Bicep curl BUE with RTB 2 sets of 12 reps  Tricep press down BUE with RTB TB 2 sets of 12 reps/UE  Ant  delt shoulder flex  2x 20 reps  Middle delt shoulder abd 2x20.    Shoulder shrugs into scapular depression with back unsupported on power chair: 2x8     PATIENT EDUCATION: Education details: PT plan of care/use of resistive bands Person educated: Patient Education method: Explanation, Demonstration, Tactile cues, and Verbal cues Education comprehension: verbalized understanding, returned demonstration, verbal cues required, tactile cues required, and needs further education   HOME EXERCISE PROGRAM: Discussed use of theraband and demo shoulder flex/abd and elbow flex and ext with use of yellow TB. Patient required assist to position therband but able to perform well with only one person assist    GOALS: Goals reviewed with patient? Yes  SHORT TERM GOALS: Target date: 12/21/2021  Pt will be independent with initial UE strengthening HEP in order to improve strength and balance in order to decrease fall risk and improve function at home and work. Baseline: 10/19/2021- patient with no formal UE HEP Goal status: INITIAL  LONG TERM GOALS: Target date: 02/01/2022  Pt will be independent with final for UE/LE HEP in order  to improve strength and balance in order to decrease fall risk  and improve function at home and work. Baseline: Patient is currently BLE NWB and unable to participate in HEP. Has order for UE strengthening. Goal status: INITIAL  2.  Pt will improve FOTO to target score of 35  to display perceived improvements in ability to complete ADL's.  Baseline: 10/19/2021= 12  Goal status: INITIAL  3.  Pt will increase strength of B UE  by at least 1/2 MMT grade in order to demonstrate improvement in strength and function  Baseline: patient has range of 2-/5 to 4/5 BUE Strength Goal status: INITIAL  ASSESSMENT:  CLINICAL IMPRESSION: Continuing PT POC with focus on UE strengthening. Pt able to tolerate increased volume of exercises this date. Due to R shoulder weakness some exercises performed without resistance due to limited overhead motion with  Theraband. Some intermittent assist needed for band placement and remembering form/technique with exercise. Pt will continue to benefit from skilled PT services to improve strength in anticipation for BLE clearance for [facility] activities.   OBJECTIVE IMPAIRMENTS Abnormal gait, decreased activity tolerance, decreased balance, decreased coordination, decreased endurance, decreased mobility, difficulty walking, decreased ROM, decreased strength, hypomobility, impaired flexibility, impaired UE functional use, postural dysfunction, and pain.   ACTIVITY LIMITATIONS carrying, lifting, bending, sitting, standing, squatting, sleeping, stairs, transfers, bed mobility, continence, bathing, toileting, dressing, self feeding, reach over head, hygiene/grooming, and caring for others  PARTICIPATION LIMITATIONS: meal prep, cleaning, laundry, medication management, personal finances, interpersonal relationship, driving, shopping, community activity, and yard work  PERSONAL FACTORS Age, Time since onset of injury/illness/exacerbation, and 1-2 comorbidities: HTN, cervical Sx  are also affecting patient's functional outcome.   REHAB POTENTIAL:  Good  CLINICAL DECISION MAKING: Evolving/moderate complexity  EVALUATION COMPLEXITY: Moderate  PLAN: PT FREQUENCY: 2x/week  PT DURATION: 12 weeks  PLANNED INTERVENTIONS: Therapeutic exercises, Therapeutic activity, Neuromuscular re-education, Balance training, Gait training, Patient/Family education, Self Care, Joint mobilization, DME instructions, Dry Needling, Electrical stimulation, Wheelchair mobility training, Spinal mobilization, Cryotherapy, Moist heat, and Manual therapy  PLAN FOR NEXT SESSION: Instruct in home program for UE strengthening   Salem Caster. Fairly IV, PT, DPT Physical Therapist- [facility]  11/09/2021, 12:53 PM

## 2021-11-09 NOTE — Therapy (Addendum)
Occupational Therapy Treatment Note    Patient Name: Sherry [name] MRN: [id number] DOB:[birth year], 87 y.o., female Today's Date: 11/09/2021                                                                                                                                                         PCP: Dr. Viviana Simpler REFERRING PROVIDER: Dr. Viviana Simpler   OT End of Session - 11/09/21 1641     Visit Number 12    Number of Visits 24    Date for OT Re-Evaluation 12/07/21    Authorization Time Period Progress report period starting 09/14/2021    OT Start Time 1100    OT Stop Time 1145    OT Time Calculation (min) 45 min    Activity Tolerance Patient tolerated treatment well    Behavior During Therapy [facility] for tasks assessed/performed             Past Medical History:  Diagnosis Date   Acute blood loss anemia    Arthritis    Cancer ([place])    skin   Central cord syndrome at C6 level of cervical spinal cord ([place]) 11/29/2017   Hypertension    Protein-calorie malnutrition, severe ([place]) 01/24/2018   S/P insertion of IVC (inferior vena caval) filter 01/24/2018   Tetraparesis ([place])    Past Surgical History:  Procedure Laterality Date   ANTERIOR CERVICAL DECOMP/DISCECTOMY FUSION N/A 11/29/2017   Procedure: Cervical five-six, six-seven Anterior Cervical Decompression Fusion;  Surgeon: Judith Part, MD;  Location: [place];  Service: Neurosurgery;  Laterality: N/A;  Cervical five-six, six-seven Anterior Cervical Decompression Fusion   CATARACT EXTRACTION     FEMUR IM NAIL Left 08/16/2021   Procedure: INTRAMEDULLARY (IM) NAIL FEMORAL;  Surgeon: Earnestine Leys, MD;  Location: [facility];  Service: Orthopedics;  Laterality: Left;   IR IVC FILTER PLMT / S&I /IMG GUID/MOD SED  12/08/2017   Patient Active Problem List   Diagnosis Date Noted   Closed hip fracture ([place]) 08/15/2021   DVT (deep venous thrombosis) ([place]) 08/15/2021   Quadriplegia ([place]) 08/15/2021   Fall 08/15/2021   Acute  appendicitis with appendiceal abscess 05/30/2018   Hypocalcemia 02/15/2018   Dysuria 02/15/2018   Bilateral lower extremity edema 02/11/2018   Vaginal yeast infection 01/30/2018   Chest tightness 01/27/2018   Healthcare-associated pneumonia 01/25/2018   Pleural effusion, not elsewhere classified 01/25/2018   Acute deep vein thrombosis (DVT) of left lower extremity ([place]) 01/24/2018   Acute deep vein thrombosis (DVT) of right lower extremity ([place]) 01/24/2018   Chronic allergic rhinitis 01/24/2018   Depression with anxiety 01/24/2018   UTI due to Klebsiella species 01/24/2018   Protein-calorie malnutrition, severe ([place]) 01/24/2018   S/P insertion of IVC (inferior vena caval) filter 01/24/2018   Reactive depression    Hypokalemia  Leukocytosis    Essential hypertension    Trauma    Tetraparesis (HCC)    Neuropathic pain    Neurogenic bowel    Neurogenic bladder    Benign essential HTN    Acute postoperative anemia due to expected blood loss    Central cord syndrome at C6 level of cervical spinal cord ([place]) 11/29/2017   Central cord syndrome ([place]) 11/29/2017   SCC (squamous cell carcinoma) 04/08/2014      REFERRING DIAG: Central Cord Syndrome at C6 of the Cervical Spinal Cord, Fall with Bilateral Closed Hip Fractures, with ORIF repair with Intramedullary Nailing of the Left Hip Fracture.  THERAPY DIAG:  Muscle weakness (generalized)  Rationale for Evaluation and Treatment Rehabilitation  PERTINENT HISTORY: Patient s/p fall November 29, 2017 resulting in diagnosis of central cord syndrome at C 6 level.  She has had therapy in multiple venues but with recent move to [facility], therapy staff recommended she seek outpatient therapy for her needs. Pt. Had a recent fall with Bilateral Closed Hip Fractures, with ORIF repair with Intramedullary Nailing of the Left Hip Fracture.   PRECAUTIONS: None  SUBJECTIVE:   Pt. reports doing well today.  PAIN:  Are you having pain?  No     OBJECTIVE:   TODAY'S TREATMENT:    Manual Therapy:   Pt. tolerated soft tissue massage to the volar, and dorsal surface of each digit on the bilateral hands secondary to stiffness. Manual therapy was performed independent of, and in preparation for therapeutic Ex.   Therapeutic Ex.    Pt. worked on [employer], and reciprocal motion using the UBE while seated for 8 min. with minimal resistance. Constant monitoring was provided. Pt. tolerated PROM followed by AROM bilateral wrist extension, PROM for bilateral digit MP, PIP, and DIP flexion, and extension, thumb radial, and palmar abduction in conjunction with moist heat. Pt. worked on Bilateral AROM for the wrist using the handheld wrist maze to increase AROM through multiple movement patterns.    Pt. continues to tolerate manual therapy, and UE ther. ex well today without reports of pain following moist heat modality to the bilateral shoulders, and hands. Pt. required cues for visual demonstration with the wrist maze. Pt. presents with tightness at the bilateral thumb webspace, and bilateral wrist flexor tightness, as well as digit MP, PIP, and DIP extensor tightness which continues to limit her ability to complete ADL tasks efficiently. Pt. continues to benefit from working on impoving ROM in order to work towards increasing engagement of her bilateral hands during ADLs, and IADL tasks.    PATIENT EDUCATION: Education details: BUE ROM, hand function, UE functioning Person educated: Patient Education method: Explanation and Demonstration Education comprehension: verbalized understanding, returned demonstration, and needs further education   HOME EXERCISE PROGRAM Continue with ongoing HEPs     OT Long Term Goals - 07/06/21 1106                                                                                          Target Date: 12/07/2021    OT LONG TERM GOAL #1   Title Patient and caregiver will demonstrate  understanding of home exercise program for ROM.    Baseline 10/28/2021: Pt. Is attempting exercises/stretches at home. The facility restorative aides have not resumed exercises. 09/14/2021:  Pt. Reports inconsistencies with restorative ROM at  her care home. Pt/caregiver education to be provided about ongoing ROM needs.    Time 12    Period Weeks    Status ongoing     OT LONG TERM GOAL #2   Title Pt.  Will improve BUE strength to be able to sustain her UE's in elevation to efficiently, and effectively perform hair care, and self-grooming tasks.   Baseline 10/28/2021: Right shoulder 3+/5, Left shoulder 4-/5 09/14/2021:  BUE shoulder strength: 3+/5 for shoulder flexion, and abduction.   Time 12    Period Weeks    Status Ongoing               OT LONG TERM GOAL #3   Title Patient will demonstrate improved composite finger flexion to hold deodorant to apply to underarms.    Baseline 10/28/2021:  Pt. Continues to have difficulty holding the deodorant. 01/12/2021: Pt. Presents with limited digit extension. Pt. Is able to initiate holding deodorant, however is unable to hold it while using it.   Time 12    Period Weeks    Status Ongoing   Target Date                  OT LONG TERM GOAL #4   Title Pt. Will improve bilateral wrist extension in preparation for anticipating, and initiating reaching for objects at the table.    Baseline 10/28/2021:  Right 22(38), Left 0(15) 09/14/2021:  Right: 17(35), left 2(15)   Time 12   Period weeks   Status Ongoing   Target Date      OT LONG TERM GOAL #5   Title Pt. will write, and sign her name with 100% legibility, and modified independence.    Baseline 10/28/2021: pt. is able to sign her name, however legibility continues to be inconsistent. Pt. Reports "shaky appearance". 09/14/2021: Pt. continues to fill out her daily menu and complete puzzles. Pt. Continues to present with limited writing legibility.   Time 12    Period Weeks    Status  New          OT  LONG TERM GOAL  #6   TITLE Pt. will turn pages in a book with modified independence.    Baseline 10/28/2021: Pt. Is able to consistently turn book pages. 09/14/2021: Pt. has difficulty turning pages.    Time 12    Period Weeks    Status Achieved                 Plan - 07/22/21 1737     Clinical Impression Statement Pt. continues to tolerate manual therapy, and UE ther. ex well today without reports of pain following moist heat modality to the bilateral shoulders, and hands. Pt. required cues for visual demonstration with the wrist maze. Pt. presents with tightness at the bilateral thumb webspace, and bilateral wrist flexor tightness, as well as digit MP, PIP, and DIP extensor tightness which continues to limit her ability to complete ADL tasks efficiently. Pt. continues to benefit from working on impoving ROM in order to work towards increasing engagement of her bilateral hands during ADLs, and IADL tasks.                         OT Occupational Profile and History Detailed  Assessment- Review of Records and additional review of physical, cognitive, psychosocial history related to current functional performance    Occupational performance deficits (Please refer to evaluation for details): ADL's;IADL's;Leisure;Social Participation    Body Structure / Function / Physical Skills ADL;Continence;Dexterity;Flexibility;Strength;ROM;Balance;Coordination;FMC;IADL;Endurance;UE functional use;Decreased knowledge of use of DME;[facility]    Psychosocial Skills Coping Strategies    Rehab Potential Fair    Clinical Decision Making Several treatment options, min-mod task modification necessary    Comorbidities Affecting Occupational Performance: Presence of comorbidities impacting occupational performance    Comorbidities impacting occupational performance description: contractures of bilateral hands, dependent transfers,    Modification or Assistance to Complete Evaluation  No modification of tasks or  assist necessary to complete eval    OT Frequency 2x / week    OT Duration 12 weeks    OT Treatment/Interventions Self-care/ADL training;Cryotherapy;Therapeutic exercise;DME and/or AE instruction;Balance training;Neuromuscular education;Manual Therapy;Splinting;Moist Heat;Passive range of motion;Therapeutic activities;Patient/family education    Plan continue to progress ROM of digits, wrists and shoulders as it pertains to completion of ADL tasks that pt values.    Consulted and Agree with Plan of Care Patient            Harrel Carina, MS, OTR/L  Harrel Carina, OT 11/09/2021, 4:45 PM

## 2021-11-11 ENCOUNTER — Ambulatory Visit: Payer: Medicare HMO | Admitting: Occupational Therapy

## 2021-11-11 ENCOUNTER — Ambulatory Visit: Payer: Medicare HMO

## 2021-11-11 ENCOUNTER — Encounter: Payer: Self-pay | Admitting: Occupational Therapy

## 2021-11-11 DIAGNOSIS — R2681 Unsteadiness on feet: Secondary | ICD-10-CM

## 2021-11-11 DIAGNOSIS — R269 Unspecified abnormalities of gait and mobility: Secondary | ICD-10-CM

## 2021-11-11 DIAGNOSIS — R2689 Other abnormalities of gait and mobility: Secondary | ICD-10-CM

## 2021-11-11 DIAGNOSIS — M6281 Muscle weakness (generalized): Secondary | ICD-10-CM

## 2021-11-11 DIAGNOSIS — R278 Other lack of coordination: Secondary | ICD-10-CM

## 2021-11-11 DIAGNOSIS — S72001A Fracture of unspecified part of neck of right femur, initial encounter for closed fracture: Secondary | ICD-10-CM

## 2021-11-11 DIAGNOSIS — S72002A Fracture of unspecified part of neck of left femur, initial encounter for closed fracture: Secondary | ICD-10-CM

## 2021-11-11 DIAGNOSIS — S14129S Central cord syndrome at unspecified level of cervical spinal cord, sequela: Secondary | ICD-10-CM

## 2021-11-11 DIAGNOSIS — R262 Difficulty in walking, not elsewhere classified: Secondary | ICD-10-CM

## 2021-11-11 NOTE — Therapy (Signed)
Occupational Therapy Treatment Note    Patient Name: Sherry [name] MRN: [id number] DOB:1934-06-13, 87 y.o., female Today's Date: 11/11/2021                                                                                                                                                         PCP: Dr. Viviana Simpler REFERRING PROVIDER: Dr. Viviana Simpler   OT End of Session - 11/11/21 1152     Visit Number 13    Number of Visits 24    Date for OT Re-Evaluation 12/07/21    Authorization Time Period Progress report period starting 09/14/2021    OT Start Time 63    OT Stop Time 1145    OT Time Calculation (min) 45 min    Activity Tolerance Patient tolerated treatment well    Behavior During Therapy [facility] for tasks assessed/performed             Past Medical History:  Diagnosis Date   Acute blood loss anemia    Arthritis    Cancer ([place])    skin   Central cord syndrome at C6 level of cervical spinal cord ([place]) 11/29/2017   Hypertension    Protein-calorie malnutrition, severe ([place]) 01/24/2018   S/P insertion of IVC (inferior vena caval) filter 01/24/2018   Tetraparesis ([place])    Past Surgical History:  Procedure Laterality Date   ANTERIOR CERVICAL DECOMP/DISCECTOMY FUSION N/A 11/29/2017   Procedure: Cervical five-six, six-seven Anterior Cervical Decompression Fusion;  Surgeon: Judith Part, MD;  Location: [facility];  Service: Neurosurgery;  Laterality: N/A;  Cervical five-six, six-seven Anterior Cervical Decompression Fusion   CATARACT EXTRACTION     FEMUR IM NAIL Left 08/16/2021   Procedure: INTRAMEDULLARY (IM) NAIL FEMORAL;  Surgeon: Earnestine Leys, MD;  Location: [facility];  Service: Orthopedics;  Laterality: Left;   IR IVC FILTER PLMT / S&I /IMG GUID/MOD SED  12/08/2017   Patient Active Problem List   Diagnosis Date Noted   Closed hip fracture ([place]) 08/15/2021   DVT (deep venous thrombosis) ([place]) 08/15/2021   Quadriplegia ([place]) 08/15/2021   Fall 08/15/2021   Acute  appendicitis with appendiceal abscess 05/30/2018   Hypocalcemia 02/15/2018   Dysuria 02/15/2018   Bilateral lower extremity edema 02/11/2018   Vaginal yeast infection 01/30/2018   Chest tightness 01/27/2018   Healthcare-associated pneumonia 01/25/2018   Pleural effusion, not elsewhere classified 01/25/2018   Acute deep vein thrombosis (DVT) of left lower extremity ([place]) 01/24/2018   Acute deep vein thrombosis (DVT) of right lower extremity ([place]) 01/24/2018   Chronic allergic rhinitis 01/24/2018   Depression with anxiety 01/24/2018   UTI due to Klebsiella species 01/24/2018   Protein-calorie malnutrition, severe ([place]) 01/24/2018   S/P insertion of IVC (inferior vena caval) filter 01/24/2018   Reactive depression    Hypokalemia  Leukocytosis    Essential hypertension    Trauma    Tetraparesis (HCC)    Neuropathic pain    Neurogenic bowel    Neurogenic bladder    Benign essential HTN    Acute postoperative anemia due to expected blood loss    Central cord syndrome at C6 level of cervical spinal cord ([place]) 11/29/2017   Central cord syndrome ([place]) 11/29/2017   SCC (squamous cell carcinoma) 04/08/2014      REFERRING DIAG: Central Cord Syndrome at C6 of the Cervical Spinal Cord, Fall with Bilateral Closed Hip Fractures, with ORIF repair with Intramedullary Nailing of the Left Hip Fracture.  THERAPY DIAG:  Muscle weakness (generalized)  Rationale for Evaluation and Treatment Rehabilitation  PERTINENT HISTORY: Patient s/p fall November 29, 2017 resulting in diagnosis of central cord syndrome at C 6 level.  She has had therapy in multiple venues but with recent move to [facility], therapy staff recommended she seek outpatient therapy for her needs. Pt. Had a recent fall with Bilateral Closed Hip Fractures, with ORIF repair with Intramedullary Nailing of the Left Hip Fracture.   PRECAUTIONS: None  SUBJECTIVE:   Pt. reports doing well today.  PAIN:  Are you having pain?  No     OBJECTIVE:   TODAY'S TREATMENT:    Manual Therapy:   Pt. tolerated soft tissue massage to the volar, and dorsal surface of each digit on the bilateral hands secondary to stiffness. Manual therapy was performed independent of, and in preparation for therapeutic Ex.   Therapeutic Ex.    Pt. worked on [employer], and reciprocal motion using the UBE while seated for 8 min. with minimal resistance. Constant monitoring was provided. Pt. tolerated PROM followed by AROM bilateral wrist extension, PROM for bilateral digit MP, PIP, and DIP flexion, and extension, thumb radial, and palmar abduction in conjunction with moist heat. Pt. worked on Bilateral AROM for the wrist using the handheld wrist maze to increase AROM through multiple movement patterns.    Pt. continues to tolerate manual therapy, and UE ther. ex well today without reports of pain following moist heat modality to the bilateral shoulders, and hands. Pt. Is able to achieve increased passive digit flexion on the right. Pt. presents with tightness at the bilateral thumb webspace, and bilateral wrist flexor tightness, as well as digit MP, PIP, and DIP extensor tightness which continues to limit her ability to complete ADL tasks efficiently. Pt. continues to benefit from working on impoving ROM in order to work towards increasing engagement of her bilateral hands during ADLs, and IADL tasks.    PATIENT EDUCATION: Education details: BUE ROM, hand function, UE functioning Person educated: Patient Education method: Explanation and Demonstration Education comprehension: verbalized understanding, returned demonstration, and needs further education   HOME EXERCISE PROGRAM Continue with ongoing HEPs     OT Long Term Goals - 07/06/21 1106                                                                                          Target Date: 12/07/2021    OT LONG TERM GOAL #1   Title Patient and caregiver will  demonstrate  understanding of home exercise program for ROM.    Baseline 10/28/2021: Pt. Is attempting exercises/stretches at home. The facility restorative aides have not resumed exercises. 09/14/2021:  Pt. Reports inconsistencies with restorative ROM at  her care home. Pt/caregiver education to be provided about ongoing ROM needs.    Time 12    Period Weeks    Status ongoing     OT LONG TERM GOAL #2   Title Pt.  Will improve BUE strength to be able to sustain her UE's in elevation to efficiently, and effectively perform hair care, and self-grooming tasks.   Baseline 10/28/2021: Right shoulder 3+/5, Left shoulder 4-/5 09/14/2021:  BUE shoulder strength: 3+/5 for shoulder flexion, and abduction.   Time 12    Period Weeks    Status Ongoing               OT LONG TERM GOAL #3   Title Patient will demonstrate improved composite finger flexion to hold deodorant to apply to underarms.    Baseline 10/28/2021:  Pt. Continues to have difficulty holding the deodorant. 01/12/2021: Pt. Presents with limited digit extension. Pt. Is able to initiate holding deodorant, however is unable to hold it while using it.   Time 12    Period Weeks    Status Ongoing   Target Date                  OT LONG TERM GOAL #4   Title Pt. Will improve bilateral wrist extension in preparation for anticipating, and initiating reaching for objects at the table.    Baseline 10/28/2021:  Right 22(38), Left 0(15) 09/14/2021:  Right: 17(35), left 2(15)   Time 12   Period weeks   Status Ongoing   Target Date      OT LONG TERM GOAL #5   Title Pt. will write, and sign her name with 100% legibility, and modified independence.    Baseline 10/28/2021: pt. is able to sign her name, however legibility continues to be inconsistent. Pt. Reports "shaky appearance". 09/14/2021: Pt. continues to fill out her daily menu and complete puzzles. Pt. Continues to present with limited writing legibility.   Time 12    Period Weeks    Status  New          OT  LONG TERM GOAL  #6   TITLE Pt. will turn pages in a book with modified independence.    Baseline 10/28/2021: Pt. Is able to consistently turn book pages. 09/14/2021: Pt. has difficulty turning pages.    Time 12    Period Weeks    Status Achieved                 Plan - 07/22/21 1737     Clinical Impression Statement Pt. continues to tolerate manual therapy, and UE ther. ex well today without reports of pain following moist heat modality to the bilateral shoulders, and hands. Pt. Is able to achieve increased passive digit flexion on the right. Pt. presents with tightness at the bilateral thumb webspace, and bilateral wrist flexor tightness, as well as digit MP, PIP, and DIP extensor tightness which continues to limit her ability to complete ADL tasks efficiently. Pt. continues to benefit from working on impoving ROM in order to work towards increasing engagement of her bilateral hands during ADLs, and IADL tasks.                          OT  Occupational Profile and History Detailed Assessment- Review of Records and additional review of physical, cognitive, psychosocial history related to current functional performance    Occupational performance deficits (Please refer to evaluation for details): ADL's;IADL's;Leisure;Social Participation    Body Structure / Function / Physical Skills ADL;Continence;Dexterity;Flexibility;Strength;ROM;Balance;Coordination;FMC;IADL;Endurance;UE functional use;Decreased knowledge of use of DME;[facility]    Psychosocial Skills Coping Strategies    Rehab Potential Fair    Clinical Decision Making Several treatment options, min-mod task modification necessary    Comorbidities Affecting Occupational Performance: Presence of comorbidities impacting occupational performance    Comorbidities impacting occupational performance description: contractures of bilateral hands, dependent transfers,    Modification or Assistance to Complete Evaluation  No modification of tasks  or assist necessary to complete eval    OT Frequency 2x / week    OT Duration 12 weeks    OT Treatment/Interventions Self-care/ADL training;Cryotherapy;Therapeutic exercise;DME and/or AE instruction;Balance training;Neuromuscular education;Manual Therapy;Splinting;Moist Heat;Passive range of motion;Therapeutic activities;Patient/family education    Plan continue to progress ROM of digits, wrists and shoulders as it pertains to completion of ADL tasks that pt values.    Consulted and Agree with Plan of Care Patient            Harrel Carina, MS, OTR/L  Harrel Carina, OT 11/11/2021, 11:55 AM

## 2021-11-11 NOTE — Therapy (Signed)
OUTPATIENT PHYSICAL THERAPY NEURO TREATMENT   Patient Name: Sherry [name] MRN: [id number] DOB:August 29, 1934, 87 y.o., female Today's Date: 11/11/2021   PCP: Dewayne Shorter, MD REFERRING PROVIDER: Earnestine Leys   PT End of Session - 11/11/21 1048     Visit Number 6    Number of Visits 24    Date for PT Re-Evaluation 01/11/22    Authorization Time Period Initial cert [id number]/31/[id number]    Progress Note Due on Visit 10    PT Start Time 1145    PT Stop Time 1221    PT Time Calculation (min) 36 min    Equipment Utilized During Treatment Gait belt    Activity Tolerance Patient tolerated treatment well    Behavior During Therapy WFL for tasks assessed/performed               Past Medical History:  Diagnosis Date   Acute blood loss anemia    Arthritis    Cancer ([place])    skin   Central cord syndrome at C6 level of cervical spinal cord ([place]) 11/29/2017   Hypertension    Protein-calorie malnutrition, severe ([place]) 01/24/2018   S/P insertion of IVC (inferior vena caval) filter 01/24/2018   Tetraparesis ([place])    Past Surgical History:  Procedure Laterality Date   ANTERIOR CERVICAL DECOMP/DISCECTOMY FUSION N/A 11/29/2017   Procedure: Cervical five-six, six-seven Anterior Cervical Decompression Fusion;  Surgeon: Judith Part, MD;  Location: [facility];  Service: Neurosurgery;  Laterality: N/A;  Cervical five-six, six-seven Anterior Cervical Decompression Fusion   CATARACT EXTRACTION     FEMUR IM NAIL Left 08/16/2021   Procedure: INTRAMEDULLARY (IM) NAIL FEMORAL;  Surgeon: Earnestine Leys, MD;  Location: [facility];  Service: Orthopedics;  Laterality: Left;   IR IVC FILTER PLMT / S&I /IMG GUID/MOD SED  12/08/2017   Patient Active Problem List   Diagnosis Date Noted   Closed hip fracture ([place]) 08/15/2021   DVT (deep venous thrombosis) ([place]) 08/15/2021   Quadriplegia ([place]) 08/15/2021   Fall 08/15/2021   Acute appendicitis with appendiceal abscess 05/30/2018   Hypocalcemia  02/15/2018   Dysuria 02/15/2018   Bilateral lower extremity edema 02/11/2018   Vaginal yeast infection 01/30/2018   Chest tightness 01/27/2018   Healthcare-associated pneumonia 01/25/2018   Pleural effusion, not elsewhere classified 01/25/2018   Acute deep vein thrombosis (DVT) of left lower extremity ([place]) 01/24/2018   Acute deep vein thrombosis (DVT) of right lower extremity ([place]) 01/24/2018   Chronic allergic rhinitis 01/24/2018   Depression with anxiety 01/24/2018   UTI due to Klebsiella species 01/24/2018   Protein-calorie malnutrition, severe ([place]) 01/24/2018   S/P insertion of IVC (inferior vena caval) filter 01/24/2018   Reactive depression    Hypokalemia    Leukocytosis    Essential hypertension    Trauma    Tetraparesis (HCC)    Neuropathic pain    Neurogenic bowel    Neurogenic bladder    Benign essential HTN    Acute postoperative anemia due to expected blood loss    Central cord syndrome at C6 level of cervical spinal cord ([place]) 11/29/2017   Central cord syndrome ([place]) 11/29/2017   SCC (squamous cell carcinoma) 04/08/2014    ONSET DATE: 08/15/2021 (fall with B hip fx); Initial injury was in 2019- quadraplegia due to central cord syndrom of C6 secondary to neck fracture.   REFERRING DIAG: Bilateral Hip fx; Left Open- s/p Left IM nail femoral on 08/16/2021; Right closed  THERAPY DIAG:  Muscle weakness (generalized)  Central cord  syndrome, sequela (HCC)  Abnormality of gait and mobility  Difficulty in walking, not elsewhere classified  Other abnormalities of gait and mobility  Closed fracture of both hips, initial encounter ([place])  Unsteadiness on feet  Other lack of coordination  Rationale for Evaluation and Treatment Rehabilitation  SUBJECTIVE:                                                                                                                                                                                              SUBJECTIVE  STATEMENT:     Patient reports feeling okay today without any new issues. Reports no pain and brought her pain.    Pt accompanied by:  Paid caregiver-   PERTINENT HISTORY: Patient was previous patient (see previos episode for specifics) She experienced a fall at home on 08/15/2021 and was in [facility] from 08/15/2021- 08/20/2021.  Most recent history per Dr. Laurey Arrow on 08/20/2021.  Sherry [name] is a 87 y.o. 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   Patient has history of quadriplegia, and has been doing PT/OT with improvement of function. Pt has some use of both arms and can feed herself. She can walk a little bit with assistance. Pt accidentally fell at about 930 this morning after taking shower.  She injured her hips. Hospital Course:    Closed left hip fracture [facility]) s/p mechanical fall Closed right hip fracture (?old) Acute blood loss anemia --pt is s/p  INTRAMEDULLARY (IM) NAIL FEMORAL-left on 7/16  PAIN:  Are you having pain? NO: NPRS scale: 0/10 Pain location: Right hip Pain description: Ache Aggravating factors: any active Movement of right side Relieving factors: Rest  PRECAUTIONS: Fall  WEIGHT BEARING RESTRICTIONS Yes Bilateral non-weight bearing B LE's  FALLS: Has patient fallen in last 6 months? Yes. Number of falls 1  LIVING ENVIRONMENT: Lives with: lives with their family and lives in a nursing home Lives in: Other Long term care at [place]: No Has following equipment at home: Wheelchair (power), Ramped entry, and hoyer lift  PLOF: Needs assistance with ADLs, Needs assistance with homemaking, Needs assistance with gait, Needs assistance with transfers, and Leisure: Work puzzles, read, Watch TV  PATIENT GOALS Patient goal  is to walk; Be able to strengthen my arms for better use  OBJECTIVE:   DIAGNOSTIC FINDINGS: Overall cognitive statusEXAM: DG HIP (WITH OR WITHOUT PELVIS) 2-3V LEFT; DG HIP (WITH OR WITHOUT PELVIS)  2-3V RIGHT   COMPARISON:  CT of the abdomen and pelvis 01/21/2020   FINDINGS: Left proximal femur fracture noted just below the intertrochanteric region. Marked ventral angulation noted on the cross-table image. Slight varus angulation present   Comminuted intratrochanteric fracture present on the right also with marked ventral angulation.   IMPRESSION: 1. Bilateral hip fractures. 2. Comminuted intertrochanteric fracture of the right hip. 3. Angulated fracture of the proximal left femur just below the intertrochanteric region.     Electronically Signed   By: San Morelle M.D.   On: 08/15/2021 11:54:   COGNITION:  Within functional limits for tasks assessed   SENSATION: Light touch: Impaired   EDEMA:  None  Strength R/L 2-/2- Shoulder flexion (anterior deltoid/pec major/coracobrachialis, axillary n. (C5-6) and musculocutaneous n. (C5-7)) 2-/3+ (within available ROM around 130 on lef)Shoulder abduction (deltoid/supraspinatus, axillary/suprascapular n, C5) 3+/3+ Shoulder external rotation (infraspinatus/teres minor) 4/4 Shoulder internal rotation (subcapularis/lats/pec major) 3+/3+ Shoulder extension (posterior deltoid, lats, teres major, axillary/thoracodorsal n.) 3+/3+ Shoulder horizontal abduction 4/4 Elbow flexion (biceps brachii, brachialis, brachioradialis, musculoskeletal n, C5-6) 4/4 Elbow extension (triceps, radial n, C7) 2-/2- Wrist Extension 3+/3+ Wrist Flexion   LOWER EXTREMITY MMT:    MMT Right Eval Left Eval  Hip flexion    Hip extension    Hip abduction    Hip adduction    Hip internal rotation    Hip external rotation    Knee flexion    Knee extension    Ankle dorsiflexion    Ankle plantarflexion    Ankle inversion    Ankle eversion     (Blank rows = not tested) - BLE not tested secondary to weight bearing precaution of B LE NWB  BED MOBILITY:  Unable to test due to NWB status of BLE- Patient currently dependent with all bed mobility using hoyer and caregiver assist  TRANSFERS: Currently BLE NWB- Dependent on use of hoyer for all transfers  FUNCTIONAL TESTs:   PATIENT SURVEYS:  FOTO 12  TODAY'S TREATMENT:  11/09/2021 Postural and UE strengthening:    Horizontal Shoulder Abd BUE into bear hug with no resistance 1 set of 20 reps- VC to keep shoulders abducted.   D2 PNF shoulder flex BUE (active) 2x10, RTB with Left UE and no weight with RUE    Scap row BUE with Red TB 3x12  Bicep curl BUE with RTB 2 sets of 12 reps  Tricep press down BUE with RTB TB 2 sets of 12 reps/UE  Ant delt shoulder flex  2x 20 reps  Middle delt shoulder abd (right UE without weight- 2 sets of 12 reps) and (left UE with RTB - 2 sets of 12 reps)    Post delt shoulder RTB 2 sets of 12 reps  Shoulder shrugs into scapular depression with back unsupported on power chair: 2x12 reps     PATIENT EDUCATION: Education details: PT plan of care/use of resistive bands Person educated: Patient Education method: Explanation, Demonstration, Tactile cues, and Verbal cues Education comprehension: verbalized understanding, returned demonstration, verbal cues required, tactile cues required, and needs further education   HOME EXERCISE PROGRAM: Discussed use of theraband and demo shoulder flex/abd and elbow flex and ext with use of yellow TB. Patient required assist to position therband but able to perform well with only one person assist    GOALS: Goals reviewed with patient? Yes  SHORT TERM GOALS: Target date: 12/23/2021  Pt will be independent with initial UE strengthening HEP in order to improve strength and balance in  order to decrease fall risk and improve function at home and work. Baseline: 10/19/2021- patient with no formal UE  HEP Goal status: INITIAL  LONG TERM GOALS: Target date: 02/03/2022  Pt will be independent with final for UE/LE HEP in order to improve strength and balance in order to decrease fall risk and improve function at home and work. Baseline: Patient is currently BLE NWB and unable to participate in HEP. Has order for UE strengthening. Goal status: INITIAL  2.  Pt will improve FOTO to target score of 35  to display perceived improvements in ability to complete ADL's.  Baseline: 10/19/2021= 12  Goal status: INITIAL  3.  Pt will increase strength of B UE  by at least 1/2 MMT grade in order to demonstrate improvement in strength and function  Baseline: patient has range of 2-/5 to 4/5 BUE Strength Goal status: INITIAL  ASSESSMENT:  CLINICAL IMPRESSION: Patient presents with improving UE Strength as seen by improving Right UE ROM (without resistance). Patient required less overall verbal cues than past sessions and no issues with using her RTB with handles/loops today.  Pt will continue to benefit from skilled PT services to improve strength in anticipation for BLE clearance for [place] activities.   OBJECTIVE IMPAIRMENTS Abnormal gait, decreased activity tolerance, decreased balance, decreased coordination, decreased endurance, decreased mobility, difficulty walking, decreased ROM, decreased strength, hypomobility, impaired flexibility, impaired UE functional use, postural dysfunction, and pain.   ACTIVITY LIMITATIONS carrying, lifting, bending, sitting, standing, squatting, sleeping, stairs, transfers, bed mobility, continence, bathing, toileting, dressing, self feeding, reach over head, hygiene/grooming, and caring for others  PARTICIPATION LIMITATIONS: meal prep, cleaning, laundry, medication management, personal finances, interpersonal relationship, driving, shopping, community activity, and yard work  PERSONAL FACTORS Age, Time since onset of injury/illness/exacerbation, and 1-2 comorbidities:  HTN, cervical Sx  are also affecting patient's functional outcome.   REHAB POTENTIAL: Good  CLINICAL DECISION MAKING: Evolving/moderate complexity  EVALUATION COMPLEXITY: Moderate  PLAN: PT FREQUENCY: 2x/week  PT DURATION: 12 weeks  PLANNED INTERVENTIONS: Therapeutic exercises, Therapeutic activity, Neuromuscular re-education, Balance training, Gait training, Patient/Family education, Self Care, Joint mobilization, DME instructions, Dry Needling, Electrical stimulation, Wheelchair mobility training, Spinal mobilization, Cryotherapy, Moist heat, and Manual therapy  PLAN FOR NEXT SESSION: Instruct in home program for UE strengthening  Ollen Bowl, PT Physical Therapist- [facility]  11/11/2021, 1:00 PM

## 2021-11-16 ENCOUNTER — Ambulatory Visit: Payer: Medicare HMO | Admitting: Occupational Therapy

## 2021-11-16 ENCOUNTER — Encounter: Payer: Self-pay | Admitting: Occupational Therapy

## 2021-11-16 ENCOUNTER — Ambulatory Visit: Payer: Medicare HMO

## 2021-11-16 DIAGNOSIS — S72001A Fracture of unspecified part of neck of right femur, initial encounter for closed fracture: Secondary | ICD-10-CM

## 2021-11-16 DIAGNOSIS — R278 Other lack of coordination: Secondary | ICD-10-CM

## 2021-11-16 DIAGNOSIS — R2689 Other abnormalities of gait and mobility: Secondary | ICD-10-CM

## 2021-11-16 DIAGNOSIS — S14129S Central cord syndrome at unspecified level of cervical spinal cord, sequela: Secondary | ICD-10-CM

## 2021-11-16 DIAGNOSIS — M6281 Muscle weakness (generalized): Secondary | ICD-10-CM

## 2021-11-16 DIAGNOSIS — R262 Difficulty in walking, not elsewhere classified: Secondary | ICD-10-CM

## 2021-11-16 DIAGNOSIS — R2681 Unsteadiness on feet: Secondary | ICD-10-CM

## 2021-11-16 DIAGNOSIS — R269 Unspecified abnormalities of gait and mobility: Secondary | ICD-10-CM

## 2021-11-16 NOTE — Therapy (Signed)
Occupational Therapy Treatment Note    Patient Name: Sherry [name] MRN: [id number] DOB:02-10-1934, 87 y.o., female Today's Date: 11/16/2021                                                                                                                                                         PCP: Dr. Viviana Simpler REFERRING PROVIDER: Dr. Viviana Simpler   OT End of Session - 11/16/21 1420     Visit Number 14    Number of Visits 24    Date for OT Re-Evaluation 12/07/21    Authorization Time Period Progress report period starting 09/14/2021    OT Start Time 1103    OT Stop Time 1145    OT Time Calculation (min) 42 min    Equipment Utilized During Treatment moist heat    Activity Tolerance Patient tolerated treatment well    Behavior During Therapy WFL for tasks assessed/performed             Past Medical History:  Diagnosis Date   Acute blood loss anemia    Arthritis    Cancer ([place])    skin   Central cord syndrome at C6 level of cervical spinal cord ([place]) 11/29/2017   Hypertension    Protein-calorie malnutrition, severe ([place]) 01/24/2018   S/P insertion of IVC (inferior vena caval) filter 01/24/2018   Tetraparesis ([place])    Past Surgical History:  Procedure Laterality Date   ANTERIOR CERVICAL DECOMP/DISCECTOMY FUSION N/A 11/29/2017   Procedure: Cervical five-six, six-seven Anterior Cervical Decompression Fusion;  Surgeon: Judith Part, MD;  Location: [facility];  Service: Neurosurgery;  Laterality: N/A;  Cervical five-six, six-seven Anterior Cervical Decompression Fusion   CATARACT EXTRACTION     FEMUR IM NAIL Left 08/16/2021   Procedure: INTRAMEDULLARY (IM) NAIL FEMORAL;  Surgeon: Earnestine Leys, MD;  Location: [facility];  Service: Orthopedics;  Laterality: Left;   IR IVC FILTER PLMT / S&I /IMG GUID/MOD SED  12/08/2017   Patient Active Problem List   Diagnosis Date Noted   Closed hip fracture ([place]) 08/15/2021   DVT (deep venous thrombosis) ([place]) 08/15/2021    Quadriplegia ([place]) 08/15/2021   Fall 08/15/2021   Acute appendicitis with appendiceal abscess 05/30/2018   Hypocalcemia 02/15/2018   Dysuria 02/15/2018   Bilateral lower extremity edema 02/11/2018   Vaginal yeast infection 01/30/2018   Chest tightness 01/27/2018   Healthcare-associated pneumonia 01/25/2018   Pleural effusion, not elsewhere classified 01/25/2018   Acute deep vein thrombosis (DVT) of left lower extremity ([place]) 01/24/2018   Acute deep vein thrombosis (DVT) of right lower extremity ([place]) 01/24/2018   Chronic allergic rhinitis 01/24/2018   Depression with anxiety 01/24/2018   UTI due to Klebsiella species 01/24/2018   Protein-calorie malnutrition, severe ([place]) 01/24/2018   S/P insertion of IVC (inferior vena caval) filter 01/24/2018  Reactive depression    Hypokalemia    Leukocytosis    Essential hypertension    Trauma    Tetraparesis (HCC)    Neuropathic pain    Neurogenic bowel    Neurogenic bladder    Benign essential HTN    Acute postoperative anemia due to expected blood loss    Central cord syndrome at C6 level of cervical spinal cord ([place]) 11/29/2017   Central cord syndrome ([place]) 11/29/2017   SCC (squamous cell carcinoma) 04/08/2014      REFERRING DIAG: Central Cord Syndrome at C6 of the Cervical Spinal Cord, Fall with Bilateral Closed Hip Fractures, with ORIF repair with Intramedullary Nailing of the Left Hip Fracture.  THERAPY DIAG:  Muscle weakness (generalized)  Rationale for Evaluation and Treatment Rehabilitation  PERTINENT HISTORY: Patient s/p fall November 29, 2017 resulting in diagnosis of central cord syndrome at C 6 level.  She has had therapy in multiple venues but with recent move to [facility], therapy staff recommended she seek outpatient therapy for her needs. Pt. Had a recent fall with Bilateral Closed Hip Fractures, with ORIF repair with Intramedullary Nailing of the Left Hip Fracture.   PRECAUTIONS: None  SUBJECTIVE:   Pt. Reports  having a follow-up appointment with her orthopedic physician this week.  PAIN:  Are you having pain? No     OBJECTIVE:   TODAY'S TREATMENT:    Manual Therapy:   Pt. tolerated soft tissue massage to the volar, and dorsal surface of each digit on the bilateral hands secondary to stiffness. Manual therapy was performed independent of, and in preparation for therapeutic Ex.   Therapeutic Ex.    Pt. worked on [employer], and reciprocal motion using the UBE while seated for 8 min. with minimal resistance. Constant monitoring was provided. Pt. tolerated PROM followed by AROM bilateral wrist extension, PROM for bilateral digit MP, PIP, and DIP flexion, and extension, thumb radial, and palmar abduction in conjunction with moist heat. Pt. worked on Bilateral AROM for the wrist using the handheld wrist maze to increase AROM through multiple movement patterns.    Pt. Has a follow-up appointment with her orthopedic physician this week. Pt. continues to tolerate manual therapy, and UE ther. ex well today without reports of pain following moist heat modality to the bilateral shoulders, and hands. Pt. Is able to achieve increased passive digit flexion on the right. Pt. presents with tightness at the bilateral thumb webspace, and bilateral wrist flexor tightness, as well as digit MP, PIP, and DIP extensor tightness which continues to limit her ability to complete ADL tasks efficiently. Pt. continues to benefit from working on impoving ROM in order to work towards increasing engagement of her bilateral hands during ADLs, and IADL tasks.    PATIENT EDUCATION: Education details: BUE ROM, hand function, UE functioning Person educated: Patient Education method: Explanation and Demonstration Education comprehension: verbalized understanding, returned demonstration, and needs further education   HOME EXERCISE PROGRAM Continue with ongoing HEPs     OT Long Term Goals - 07/06/21 1106  Target Date: 12/07/2021    OT LONG TERM GOAL #1   Title Patient and caregiver will demonstrate understanding of home exercise program for ROM.    Baseline 10/28/2021: Pt. Is attempting exercises/stretches at home. The facility restorative aides have not resumed exercises. 09/14/2021:  Pt. Reports inconsistencies with restorative ROM at  her care home. Pt/caregiver education to be provided about ongoing ROM needs.    Time 12    Period Weeks    Status ongoing     OT LONG TERM GOAL #2   Title Pt.  Will improve BUE strength to be able to sustain her UE's in elevation to efficiently, and effectively perform hair care, and self-grooming tasks.   Baseline 10/28/2021: Right shoulder 3+/5, Left shoulder 4-/5 09/14/2021:  BUE shoulder strength: 3+/5 for shoulder flexion, and abduction.   Time 12    Period Weeks    Status Ongoing               OT LONG TERM GOAL #3   Title Patient will demonstrate improved composite finger flexion to hold deodorant to apply to underarms.    Baseline 10/28/2021:  Pt. Continues to have difficulty holding the deodorant. 01/12/2021: Pt. Presents with limited digit extension. Pt. Is able to initiate holding deodorant, however is unable to hold it while using it.   Time 12    Period Weeks    Status Ongoing   Target Date                  OT LONG TERM GOAL #4   Title Pt. Will improve bilateral wrist extension in preparation for anticipating, and initiating reaching for objects at the table.    Baseline 10/28/2021:  Right 22(38), Left 0(15) 09/14/2021:  Right: 17(35), left 2(15)   Time 12   Period weeks   Status Ongoing   Target Date      OT LONG TERM GOAL #5   Title Pt. will write, and sign her name with 100% legibility, and modified independence.    Baseline 10/28/2021: pt. is able to sign her name, however legibility continues to be inconsistent. Pt. Reports "shaky appearance". 09/14/2021: Pt.  continues to fill out her daily menu and complete puzzles. Pt. Continues to present with limited writing legibility.   Time 12    Period Weeks    Status  New          OT LONG TERM GOAL  #6   TITLE Pt. will turn pages in a book with modified independence.    Baseline 10/28/2021: Pt. Is able to consistently turn book pages. 09/14/2021: Pt. has difficulty turning pages.    Time 12    Period Weeks    Status Achieved                 Plan - 07/22/21 1737     Clinical Impression Statement Pt. Has a follow-up appointment with her orthopedic physician this week. Pt. continues to tolerate manual therapy, and UE ther. ex well today without reports of pain following moist heat modality to the bilateral shoulders, and hands. Pt. Is able to achieve increased passive digit flexion on the right. Pt. presents with tightness at the bilateral thumb webspace, and bilateral wrist flexor tightness, as well as digit MP, PIP, and DIP extensor tightness which continues to limit her ability to complete ADL tasks efficiently. Pt. continues to benefit from working on impoving ROM in order to work towards increasing engagement of her bilateral hands during ADLs, and  IADL tasks.                            OT Occupational Profile and History Detailed Assessment- Review of Records and additional review of physical, cognitive, psychosocial history related to current functional performance    Occupational performance deficits (Please refer to evaluation for details): ADL's;IADL's;Leisure;Social Participation    Body Structure / Function / Physical Skills ADL;Continence;Dexterity;Flexibility;Strength;ROM;Balance;Coordination;FMC;IADL;Endurance;UE functional use;Decreased knowledge of use of DME;[facility]    Psychosocial Skills Coping Strategies    Rehab Potential Fair    Clinical Decision Making Several treatment options, min-mod task modification necessary    Comorbidities Affecting Occupational Performance: Presence  of comorbidities impacting occupational performance    Comorbidities impacting occupational performance description: contractures of bilateral hands, dependent transfers,    Modification or Assistance to Complete Evaluation  No modification of tasks or assist necessary to complete eval    OT Frequency 2x / week    OT Duration 12 weeks    OT Treatment/Interventions Self-care/ADL training;Cryotherapy;Therapeutic exercise;DME and/or AE instruction;Balance training;Neuromuscular education;Manual Therapy;Splinting;Moist Heat;Passive range of motion;Therapeutic activities;Patient/family education    Plan continue to progress ROM of digits, wrists and shoulders as it pertains to completion of ADL tasks that pt values.    Consulted and Agree with Plan of Care Patient            Harrel Carina, MS, OTR/L  Harrel Carina, OT 11/16/2021, 2:24 PM

## 2021-11-16 NOTE — Therapy (Signed)
OUTPATIENT PHYSICAL THERAPY NEURO TREATMENT   Patient Name: Sherry [name] MRN: [id number] DOB:[birth year], 87 y.o., female Today's Date: 11/16/2021   PCP: Sherry Shorter, MD REFERRING PROVIDER: Earnestine [name]   PT End of Session - 11/16/21 1123     Visit Number 7    Number of Visits 24    Date for PT Re-Evaluation 01/11/22    Authorization Time Period Initial cert [id number]/11/[id number]/[id number]    Progress Note Due on Visit 10    PT Start Time 3419    PT Stop Time 1216    PT Time Calculation (min) 31 min    Equipment Utilized During Treatment Gait belt    Activity Tolerance Patient tolerated treatment well    Behavior During Therapy WFL for tasks assessed/performed                Past Medical History:  Diagnosis Date   Acute blood loss anemia    Arthritis    Cancer ([place])    skin   Central cord syndrome at C6 level of cervical spinal cord ([place]) 11/29/2017   Hypertension    Protein-calorie malnutrition, severe ([place]) 01/24/2018   S/P insertion of IVC (inferior vena caval) filter 01/24/2018   Tetraparesis ([place])    Past Surgical History:  Procedure Laterality Date   ANTERIOR CERVICAL DECOMP/DISCECTOMY FUSION N/A 11/29/2017   Procedure: Cervical five-six, six-seven Anterior Cervical Decompression Fusion;  Surgeon: Sherry Part, MD;  Location: [facility];  Service: Neurosurgery;  Laterality: N/A;  Cervical five-six, six-seven Anterior Cervical Decompression Fusion   CATARACT EXTRACTION     FEMUR IM NAIL Left 08/16/2021   Procedure: INTRAMEDULLARY (IM) NAIL FEMORAL;  Surgeon: Sherry Leys, MD;  Location: [facility];  Service: Orthopedics;  Laterality: Left;   IR IVC FILTER PLMT / S&I /IMG GUID/MOD SED  12/08/2017   Patient Active Problem List   Diagnosis Date Noted   Closed hip fracture ([place]) 08/15/2021   DVT (deep venous thrombosis) ([place]) 08/15/2021   Quadriplegia ([place]) 08/15/2021   Fall 08/15/2021   Acute appendicitis with appendiceal abscess 05/30/2018    Hypocalcemia 02/15/2018   Dysuria 02/15/2018   Bilateral lower extremity edema 02/11/2018   Vaginal yeast infection 01/30/2018   Chest tightness 01/27/2018   Healthcare-associated pneumonia 01/25/2018   Pleural effusion, not elsewhere classified 01/25/2018   Acute deep vein thrombosis (DVT) of left lower extremity ([place]) 01/24/2018   Acute deep vein thrombosis (DVT) of right lower extremity ([place]) 01/24/2018   Chronic allergic rhinitis 01/24/2018   Depression with anxiety 01/24/2018   UTI due to Klebsiella species 01/24/2018   Protein-calorie malnutrition, severe ([place]) 01/24/2018   S/P insertion of IVC (inferior vena caval) filter 01/24/2018   Reactive depression    Hypokalemia    Leukocytosis    Essential hypertension    Trauma    Tetraparesis (HCC)    Neuropathic pain    Neurogenic bowel    Neurogenic bladder    Benign essential HTN    Acute postoperative anemia due to expected blood loss    Central cord syndrome at C6 level of cervical spinal cord ([place]) 11/29/2017   Central cord syndrome ([place]) 11/29/2017   SCC (squamous cell carcinoma) 04/08/2014    ONSET DATE: 08/15/2021 (fall with B hip fx); Initial injury was in 2019- quadraplegia due to central cord syndrom of C6 secondary to neck fracture.   REFERRING DIAG: Bilateral Hip fx; Left Open- s/p Left IM nail femoral on 08/16/2021; Right closed  THERAPY DIAG:  Muscle weakness (generalized)  Central  cord syndrome, sequela (HCC)  Abnormality of gait and mobility  Difficulty in walking, not elsewhere classified  Other abnormalities of gait and mobility  Closed fracture of both hips, initial encounter ([place])  Unsteadiness on feet  Other lack of coordination  Rationale for Evaluation and Treatment Rehabilitation  SUBJECTIVE:                                                                                                                                                                                               SUBJECTIVE STATEMENT:    Patient reports had a good weekend- No falls. States going back to ortho MD on Wednesday for next follow up visit.   Pt accompanied by:  Paid caregiver-   PERTINENT HISTORY: Patient was previous patient (see previous episode for specifics) She experienced a fall at home on 08/15/2021 and was in [facility] from 08/15/2021- 08/20/2021.  Most recent history per Dr. Laurey [name] on 08/20/2021.  Sherry [name] is a 87 y.o. 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   Patient has history of quadriplegia, and has been doing PT/OT with improvement of function. Pt has some use of both arms and can feed herself. She can walk a little bit with assistance. Pt accidentally fell at about 930 this morning after taking shower.  She injured her hips. Hospital Course:    Closed left hip fracture [facility] s/p mechanical fall Closed right hip fracture (?old) Acute blood loss anemia --pt is s/p  INTRAMEDULLARY (IM) NAIL FEMORAL-left on 7/16  PAIN:  Are you having pain? NO: NPRS scale: 0/10 Pain location: Right hip Pain description: Ache Aggravating factors: any active Movement of right side Relieving factors: Rest  PRECAUTIONS: Fall  WEIGHT BEARING RESTRICTIONS Yes Bilateral non-weight bearing B LE's  FALLS: Has patient fallen in last 6 months? Yes. Number of falls 1  LIVING ENVIRONMENT: Lives with: lives with their family and lives in a nursing home Lives in: Other Long term care at [place]: No Has following equipment at home: Wheelchair (power), Ramped entry, and hoyer lift  PLOF: Needs assistance with ADLs, Needs assistance with homemaking, Needs assistance with gait, Needs assistance with transfers, and Leisure: Work puzzles, read, Watch  TV  PATIENT GOALS Patient goal is to walk; Be able to strengthen my arms for better use  OBJECTIVE:   DIAGNOSTIC FINDINGS: Overall cognitive statusEXAM: DG HIP (WITH OR WITHOUT PELVIS) 2-3V LEFT; DG HIP (  WITH OR WITHOUT PELVIS) 2-3V RIGHT   COMPARISON:  CT of the abdomen and pelvis 01/21/2020   FINDINGS: Left proximal femur fracture noted just below the intertrochanteric region. Marked ventral angulation noted on the cross-table image. Slight varus angulation present   Comminuted intratrochanteric fracture present on the right also with marked ventral angulation.   IMPRESSION: 1. Bilateral hip fractures. 2. Comminuted intertrochanteric fracture of the right hip. 3. Angulated fracture of the proximal left femur just below the intertrochanteric region.     Electronically Signed   By: San Morelle M.D.   On: 08/15/2021 11:54:   COGNITION:  Within functional limits for tasks assessed   SENSATION: Light touch: Impaired   EDEMA:  None  Strength R/L 2-/2- Shoulder flexion (anterior deltoid/pec major/coracobrachialis, axillary n. (C5-6) and musculocutaneous n. (C5-7)) 2-/3+ (within available ROM around 130 on lef)Shoulder abduction (deltoid/supraspinatus, axillary/suprascapular n, C5) 3+/3+ Shoulder external rotation (infraspinatus/teres minor) 4/4 Shoulder internal rotation (subcapularis/lats/pec major) 3+/3+ Shoulder extension (posterior deltoid, lats, teres major, axillary/thoracodorsal n.) 3+/3+ Shoulder horizontal abduction 4/4 Elbow flexion (biceps brachii, brachialis, brachioradialis, musculoskeletal n, C5-6) 4/4 Elbow extension (triceps, radial n, C7) 2-/2- Wrist Extension 3+/3+ Wrist Flexion   LOWER EXTREMITY MMT:    MMT Right Eval Left Eval  Hip flexion    Hip extension    Hip abduction    Hip adduction    Hip internal rotation    Hip external rotation    Knee flexion    Knee extension    Ankle dorsiflexion    Ankle plantarflexion     Ankle inversion    Ankle eversion    (Blank rows = not tested) - BLE not tested secondary to weight bearing precaution of B LE NWB  BED MOBILITY:  Unable to test due to NWB status of BLE- Patient currently dependent with all bed mobility using hoyer and caregiver assist  TRANSFERS: Currently BLE NWB- Dependent on use of hoyer for all transfers  FUNCTIONAL TESTs:   PATIENT SURVEYS:  FOTO 12  TODAY'S TREATMENT:  11/16/2021  THEREX: Postural and UE strengthening:    Horizontal Shoulder Abd BUE with RTB 2 sets of 12 reps- VC to keep shoulders abducted.   D2 PNF shoulder flex BUE 2x10, Difficulty with RUE due to weakness   Scap row BUE with Red TB 2 x12  Bicep curl BUE with RTB 2 sets of 12 reps  Tricep press down BUE with RTB TB 2 sets of 12 reps/UE  Ant delt shoulder flex (active) BUE x 12 reps each  Active shoulder press BUE x 12 reps each.  Middle delt shoulder abd BUE RTB - 2 sets of 12 reps)    Post delt shoulder RTB 2 sets of 12 reps  Shoulder shrugs into scapular depression with back unsupported on power chair: x 15 reps     PATIENT EDUCATION: Education details: PT plan of care/use of resistive bands Person educated: Patient Education method: Explanation, Demonstration, Tactile cues, and Verbal cues Education comprehension: verbalized understanding, returned demonstration, verbal cues required, tactile cues required, and needs further education   HOME EXERCISE PROGRAM: Discussed use of theraband and demo shoulder flex/abd and elbow flex and ext with use of yellow TB. Patient required assist to position therband but able to perform well with only one person assist    GOALS: Goals reviewed with patient? Yes  SHORT TERM GOALS: Target date: 12/28/2021  Pt will be independent with initial UE strengthening HEP in order to improve strength and balance in order to  decrease fall risk and improve function at home and work. Baseline: 10/19/2021- patient with no  formal UE HEP Goal status: INITIAL  LONG TERM GOALS: Target date: 02/08/2022  Pt will be independent with final for UE/LE HEP in order to improve strength and balance in order to decrease fall risk and improve function at home and work. Baseline: Patient is currently BLE NWB and unable to participate in HEP. Has order for UE strengthening. Goal status: INITIAL  2.  Pt will improve FOTO to target score of 35  to display perceived improvements in ability to complete ADL's.  Baseline: 10/19/2021= 12  Goal status: INITIAL  3.  Pt will increase strength of B UE  by at least 1/2 MMT grade in order to demonstrate improvement in strength and function  Baseline: patient has range of 2-/5 to 4/5 BUE Strength Goal status: INITIAL  ASSESSMENT:  CLINICAL IMPRESSION: Treatment continues to focus on UE/Postural strengthening in preparation for strength needed for transfers/standing. She is progressing well and able to use some resistance on R shoulder within her available range of motion. She required less VC/visual demo for correct technique today. Patient to see Ortho MD on Wednesday- Will check for any updates to current NWB status with BLE and progress as appropriate. Will need to update goals next visit if any upgrade to weight bearing.  Pt will continue to benefit from skilled PT services to improve strength in anticipation for BLE clearance for [place] activities.   OBJECTIVE IMPAIRMENTS Abnormal gait, decreased activity tolerance, decreased balance, decreased coordination, decreased endurance, decreased mobility, difficulty walking, decreased ROM, decreased strength, hypomobility, impaired flexibility, impaired UE functional use, postural dysfunction, and pain.   ACTIVITY LIMITATIONS carrying, lifting, bending, sitting, standing, squatting, sleeping, stairs, transfers, bed mobility, continence, bathing, toileting, dressing, self feeding, reach over head, hygiene/grooming, and caring for  others  PARTICIPATION LIMITATIONS: meal prep, cleaning, laundry, medication management, personal finances, interpersonal relationship, driving, shopping, community activity, and yard work  PERSONAL FACTORS Age, Time since onset of injury/illness/exacerbation, and 1-2 comorbidities: HTN, cervical Sx  are also affecting patient's functional outcome.   REHAB POTENTIAL: Good  CLINICAL DECISION MAKING: Evolving/moderate complexity  EVALUATION COMPLEXITY: Moderate  PLAN: PT FREQUENCY: 2x/week  PT DURATION: 12 weeks  PLANNED INTERVENTIONS: Therapeutic exercises, Therapeutic activity, Neuromuscular re-education, Balance training, Gait training, Patient/Family education, Self Care, Joint mobilization, DME instructions, Dry Needling, Electrical stimulation, Wheelchair mobility training, Spinal mobilization, Cryotherapy, Moist heat, and Manual therapy  PLAN FOR NEXT SESSION: Instruct in home program for UE strengthening  Ollen Bowl, PT Physical Therapist- [facility]  11/16/2021, 1:15 PM

## 2021-11-18 ENCOUNTER — Ambulatory Visit: Payer: Medicare HMO | Admitting: Occupational Therapy

## 2021-11-18 ENCOUNTER — Ambulatory Visit: Payer: Medicare HMO

## 2021-11-23 ENCOUNTER — Ambulatory Visit: Payer: Medicare HMO

## 2021-11-23 ENCOUNTER — Ambulatory Visit: Payer: Medicare HMO | Admitting: Occupational Therapy

## 2021-11-23 ENCOUNTER — Encounter: Payer: Self-pay | Admitting: Occupational Therapy

## 2021-11-23 DIAGNOSIS — M6281 Muscle weakness (generalized): Secondary | ICD-10-CM

## 2021-11-23 DIAGNOSIS — S72002A Fracture of unspecified part of neck of left femur, initial encounter for closed fracture: Secondary | ICD-10-CM

## 2021-11-23 DIAGNOSIS — R2681 Unsteadiness on feet: Secondary | ICD-10-CM

## 2021-11-23 DIAGNOSIS — R278 Other lack of coordination: Secondary | ICD-10-CM

## 2021-11-23 DIAGNOSIS — R269 Unspecified abnormalities of gait and mobility: Secondary | ICD-10-CM

## 2021-11-23 DIAGNOSIS — R262 Difficulty in walking, not elsewhere classified: Secondary | ICD-10-CM

## 2021-11-23 DIAGNOSIS — R2689 Other abnormalities of gait and mobility: Secondary | ICD-10-CM

## 2021-11-23 NOTE — Therapy (Signed)
OUTPATIENT PHYSICAL THERAPY NEURO TREATMENT   Patient Name: Sherry [name] MRN: [id number] DOB:[birth year], 87 y.o., female Today's Date: 11/23/2021   PCP: Dewayne Shorter, MD REFERRING PROVIDER: Earnestine Leys   PT End of Session - 11/23/21 1302     Visit Number 8    Number of Visits 24    Date for PT Re-Evaluation 01/11/22    Authorization Time Period Initial cert [id number]/[id number]/7262    Progress Note Due on Visit 10    PT Start Time 0355    PT Stop Time 1232    PT Time Calculation (min) 47 min    Equipment Utilized During Treatment Gait belt    Activity Tolerance Patient tolerated treatment well    Behavior During Therapy WFL for tasks assessed/performed                 Past Medical History:  Diagnosis Date   Acute blood loss anemia    Arthritis    Cancer ([place])    skin   Central cord syndrome at C6 level of cervical spinal cord ([place]) 11/29/2017   Hypertension    Protein-calorie malnutrition, severe ([place]) 01/24/2018   S/P insertion of IVC (inferior vena caval) filter 01/24/2018   Tetraparesis ([place])    Past Surgical History:  Procedure Laterality Date   ANTERIOR CERVICAL DECOMP/DISCECTOMY FUSION N/A 11/29/2017   Procedure: Cervical five-six, six-seven Anterior Cervical Decompression Fusion;  Surgeon: Judith Part, MD;  Location: [facility];  Service: Neurosurgery;  Laterality: N/A;  Cervical five-six, six-seven Anterior Cervical Decompression Fusion   CATARACT EXTRACTION     FEMUR IM NAIL Left 08/16/2021   Procedure: INTRAMEDULLARY (IM) NAIL FEMORAL;  Surgeon: Earnestine Leys, MD;  Location: [facility];  Service: Orthopedics;  Laterality: Left;   IR IVC FILTER PLMT / S&I /IMG GUID/MOD SED  12/08/2017   Patient Active Problem List   Diagnosis Date Noted   Closed hip fracture ([place]) 08/15/2021   DVT (deep venous thrombosis) ([place]) 08/15/2021   Quadriplegia ([place]) 08/15/2021   Fall 08/15/2021   Acute appendicitis with appendiceal abscess 05/30/2018    Hypocalcemia 02/15/2018   Dysuria 02/15/2018   Bilateral lower extremity edema 02/11/2018   Vaginal yeast infection 01/30/2018   Chest tightness 01/27/2018   Healthcare-associated pneumonia 01/25/2018   Pleural effusion, not elsewhere classified 01/25/2018   Acute deep vein thrombosis (DVT) of left lower extremity ([place]) 01/24/2018   Acute deep vein thrombosis (DVT) of right lower extremity ([place]) 01/24/2018   Chronic allergic rhinitis 01/24/2018   Depression with anxiety 01/24/2018   UTI due to Klebsiella species 01/24/2018   Protein-calorie malnutrition, severe ([place]) 01/24/2018   S/P insertion of IVC (inferior vena caval) filter 01/24/2018   Reactive depression    Hypokalemia    Leukocytosis    Essential hypertension    Trauma    Tetraparesis ([facility])    Neuropathic pain    Neurogenic bowel    Neurogenic bladder    Benign essential HTN    Acute postoperative anemia due to expected blood loss    Central cord syndrome at C6 level of cervical spinal cord ([place]) 11/29/2017   Central cord syndrome ([place]) 11/29/2017   SCC (squamous cell carcinoma) 04/08/2014    ONSET DATE: 08/15/2021 (fall with B hip fx); Initial injury was in 2019- quadraplegia due to central cord syndrom of C6 secondary to neck fracture.   REFERRING DIAG: Bilateral Hip fx; Left Open- s/p Left IM nail femoral on 08/16/2021; Right closed  THERAPY DIAG:  Muscle weakness (generalized)  Abnormality of gait and mobility  Difficulty in walking, not elsewhere classified  Other abnormalities of gait and mobility  Closed fracture of both hips, initial encounter ([place])  Unsteadiness on feet  Other lack of coordination  Rationale for Evaluation and Treatment Rehabilitation  SUBJECTIVE:                                                                                                                                                                                              SUBJECTIVE STATEMENT:   Pt reported: I have new  orders from my doctor. I don't understand. Need to clarify."  Pt accompanied by:  Paid caregiver-   PERTINENT HISTORY: Patient was previous patient (see previous episode for specifics) She experienced a fall at home on 08/15/2021 and was in [facility] from 08/15/2021- 08/20/2021.  Most recent history per Dr. Laurey Arrow on 08/20/2021.  Sherry [name] is a 87 y.o. 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   Patient has history of quadriplegia, and has been doing PT/OT with improvement of function. Pt has some use of both arms and can feed herself. She can walk a little bit with assistance. Pt accidentally fell at about 930 this morning after taking shower.  She injured her hips. Hospital Course:    Closed left hip fracture [facility]) s/p mechanical fall Closed right hip fracture (?old) Acute blood loss anemia --pt is s/p  INTRAMEDULLARY (IM) NAIL FEMORAL-left on 7/16  PAIN:  Are you having pain? NO: NPRS scale: 0/10 Pain location: Right hip Pain description: Ache Aggravating factors: any active Movement of right side Relieving factors: Rest  PRECAUTIONS: Fall  WEIGHT BEARING RESTRICTIONS Yes Bilateral non-weight bearing B LE's  FALLS: Has patient fallen in last 6 months? Yes. Number of falls 1  LIVING ENVIRONMENT: Lives with: lives with their family and lives in a nursing home Lives in: Other Long term care at [facility]: No Has following equipment at home: Wheelchair (power), Ramped entry, and hoyer lift  PLOF: Needs assistance with ADLs, Needs assistance with homemaking, Needs assistance with gait, Needs assistance with transfers, and Leisure: Work puzzles, read, Watch TV  PATIENT GOALS Patient goal is to walk; Be able to strengthen my arms for better  use  OBJECTIVE:   DIAGNOSTIC FINDINGS: Overall cognitive statusEXAM: DG HIP (WITH OR WITHOUT PELVIS) 2-3V LEFT; DG HIP (WITH OR WITHOUT PELVIS) 2-3V RIGHT   COMPARISON:  CT of the  abdomen and pelvis 01/21/2020   FINDINGS: Left proximal femur fracture noted just below the intertrochanteric region. Marked ventral angulation noted on the cross-table image. Slight varus angulation present   Comminuted intratrochanteric fracture present on the right also with marked ventral angulation.   IMPRESSION: 1. Bilateral hip fractures. 2. Comminuted intertrochanteric fracture of the right hip. 3. Angulated fracture of the proximal left femur just below the intertrochanteric region.     Electronically Signed   By: San Morelle M.D.   On: 08/15/2021 11:54:   COGNITION:  Within functional limits for tasks assessed   SENSATION: Light touch: Impaired   EDEMA:  None  Strength R/L 2-/2- Shoulder flexion (anterior deltoid/pec major/coracobrachialis, axillary n. (C5-6) and musculocutaneous n. (C5-7)) 2-/3+ (within available ROM around 130 on lef)Shoulder abduction (deltoid/supraspinatus, axillary/suprascapular n, C5) 3+/3+ Shoulder external rotation (infraspinatus/teres minor) 4/4 Shoulder internal rotation (subcapularis/lats/pec major) 3+/3+ Shoulder extension (posterior deltoid, lats, teres major, axillary/thoracodorsal n.) 3+/3+ Shoulder horizontal abduction 4/4 Elbow flexion (biceps brachii, brachialis, brachioradialis, musculoskeletal n, C5-6) 4/4 Elbow extension (triceps, radial n, C7) 2-/2- Wrist Extension 3+/3+ Wrist Flexion   LOWER EXTREMITY MMT:    MMT Right Eval Left Eval  Hip flexion    Hip extension    Hip abduction    Hip adduction    Hip internal rotation    Hip external rotation    Knee flexion    Knee extension    Ankle dorsiflexion    Ankle plantarflexion    Ankle inversion    Ankle eversion    (Blank rows = not tested) - BLE not tested  secondary to weight bearing precaution of B LE NWB  BED MOBILITY:  Unable to test due to NWB status of BLE- Patient currently dependent with all bed mobility using hoyer and caregiver assist  TRANSFERS: Currently BLE NWB- Dependent on use of hoyer for all transfers  FUNCTIONAL TESTs:   PATIENT SURVEYS:  FOTO 12  TODAY'S TREATMENT:  11/23/2021 Pt received in Motorized chair agreeable to begin BLE exs as per MR orders. PT discussed new orders to improve carryover outside therapy. PT introduced pt to BLE isometrics and Knee ROM to BLE  including:  -Glute sets 1 x 10 reps 5 secs hold  -Quads sets 5 secs hold 1 x 10 reps each side -Hamstring sets 5 secs hold 1 x 10 reps each side -FAQ AROM to LLE 2 x 10 reps and AAROM to RLE  2 x 10 reps  -Seated Iliopsoas activation 1 x 10 reps 5 secs hold -Hip abduction 1 x 10 reps 5 secs hold  -Pressure relief technique 5 x both side with mod assist to clear glute -Shldr shrugs #1.5 2 x 10 reps -Seated push ups with manual resistance 1 x 10 reps each side.  -PT educated pt regarding the significance of HEP to BLE , pressure relief technique and provided HEP in H/O.          PATIENT EDUCATION: Education details: PT plan of care/use of resistive bands Person educated: Patient Education method: Explanation, Demonstration, Tactile cues, and Verbal cues Education comprehension: verbalized understanding, returned demonstration, verbal cues required, tactile cues required, and needs further education   HOME EXERCISE PROGRAM: Discussed use of theraband and demo shoulder flex/abd and elbow flex and ext with use of yellow TB. Patient required assist to position therband but able to perform well with only one person assist  Access Code: [facility] URL: [email or web address] Date: 11/23/2021 Prepared by: Janna Arch Exercises - Seated Gluteal Sets -  2 x daily - 7 x weekly - 2 sets - 10 reps - 5 hold - Seated Quad Set - 2 x daily - 7 x  weekly - 2 sets - 10 reps - 5 hold - Seated Long Arc Quad - 2 x daily - 7 x weekly - 2 sets - 10 reps - 5 hold - Seated March - 2 x daily - 7 x weekly - 2 sets - 10 reps - 5 hold - Seated Hip Abduction - 2 x daily - 7 x weekly - 2 sets - 10 reps - 5 hold - Seated Shoulder Shrugs - 2 x daily - 7 x weekly - 2 sets - 10 reps - 5 hold - Wheelchair Pressure Relief - 2 x daily - 7 x weekly - 2 sets - 10 reps - 5 hold  GOALS: Goals reviewed with patient? Yes  SHORT TERM GOALS: Target date: 01/04/2022  Pt will be independent with initial UE strengthening HEP in order to improve strength and balance in order to decrease fall risk and improve function at home and work. Baseline: 10/19/2021- patient with no formal UE HEP Goal status: INITIAL  LONG TERM GOALS: Target date: 02/15/2022  Pt will be independent with final for UE/LE HEP in order to improve strength and balance in order to decrease fall risk and improve function at home and work. Baseline: Patient is currently BLE NWB and unable to participate in HEP. Has order for UE strengthening. Goal status: INITIAL  2.  Pt will improve FOTO to target score of 35  to display perceived improvements in ability to complete ADL's.  Baseline: 10/19/2021= 12  Goal status: INITIAL  3.  Pt will increase strength of B UE  by at least 1/2 MMT grade in order to demonstrate improvement in strength and function  Baseline: patient has range of 2-/5 to 4/5 BUE Strength Goal status: INITIAL  ASSESSMENT:  CLINICAL IMPRESSION: Pt received in chair agreeable to participate in therapy. PT discussed New MD orders and explained the significance of strengthen in exs to BLE. Pt is healing well and is  now allowed to begin exs to BLE but remains NWB to BLE until next MD consultation in 6 weeks. Pt demonstrates  MMTof 1+/5 in RLE  grossly,  3 in L quads, 2+ in L hip muscles. Pt introduced to isometrics, AROM and AAROM in BLE. Pt remained asymptomatic through out the session. Pt has  pressure sore in L gluteal fold. Pt education regarding HEP and pressure relief technique. Pt needs Mod assist to clear the buttock 2/2 to weakness but able ot perform the task properly. Pt provided with a printout of HEP. Pt demonstrated good understanding of the HEP and education. Pt will continue to benefit from skilled PT services to improve strength in anticipation for BLE clearance for [place] activities.   OBJECTIVE IMPAIRMENTS Abnormal gait, decreased activity tolerance, decreased balance, decreased coordination, decreased endurance, decreased mobility, difficulty walking, decreased ROM, decreased strength, hypomobility, impaired flexibility, impaired UE functional use, postural dysfunction, and pain.   ACTIVITY LIMITATIONS carrying, lifting, bending, sitting, standing, squatting, sleeping, stairs, transfers, bed mobility, continence, bathing, toileting, dressing, self feeding, reach over head, hygiene/grooming, and caring for others  PARTICIPATION LIMITATIONS: meal prep, cleaning, laundry, medication management, personal finances, interpersonal relationship, driving, shopping, community activity, and yard work  PERSONAL FACTORS Age, Time since onset of injury/illness/exacerbation, and 1-2 comorbidities: HTN, cervical Sx  are also affecting patient's functional outcome.   REHAB POTENTIAL: Good  CLINICAL DECISION MAKING:  Evolving/moderate complexity  EVALUATION COMPLEXITY: Moderate  PLAN: PT FREQUENCY: 2x/week  PT DURATION: 12 weeks  PLANNED INTERVENTIONS: Therapeutic exercises, Therapeutic activity, Neuromuscular re-education, Balance training, Gait training, Patient/Family education, Self Care, Joint mobilization, DME instructions, Dry Needling, Electrical stimulation, Wheelchair mobility training, Spinal mobilization, Cryotherapy, Moist heat, and Manual therapy  PLAN FOR NEXT SESSION: Instruct in home program for UE strengthening  Joaquin Music PT DPT 1:10 PM,11/23/21  Physical  Therapist- [facility]  11/23/2021, 1:10 PM

## 2021-11-23 NOTE — Therapy (Signed)
Occupational Therapy Treatment Note    Patient Name: Sherry [name] MRN: [id number] DOB:[birth year], 87 y.o., female Today's Date: 11/23/2021                                                                                                                                                         PCP: Dr. Viviana Simpler REFERRING PROVIDER: Dr. Viviana Simpler   OT End of Session - 11/23/21 1203     Visit Number 15    Number of Visits 24    Date for OT Re-Evaluation 12/07/21    Authorization Time Period Progress report period starting 09/14/2021    OT Start Time 1103    OT Stop Time 1145    OT Time Calculation (min) 42 min    Activity Tolerance Patient tolerated treatment well    Behavior During Therapy [facility] for tasks assessed/performed             Past Medical History:  Diagnosis Date   Acute blood loss anemia    Arthritis    Cancer ([place])    skin   Central cord syndrome at C6 level of cervical spinal cord ([place]) 11/29/2017   Hypertension    Protein-calorie malnutrition, severe ([place]) 01/24/2018   S/P insertion of IVC (inferior vena caval) filter 01/24/2018   Tetraparesis ([place])    Past Surgical History:  Procedure Laterality Date   ANTERIOR CERVICAL DECOMP/DISCECTOMY FUSION N/A 11/29/2017   Procedure: Cervical five-six, six-seven Anterior Cervical Decompression Fusion;  Surgeon: Judith Part, MD;  Location: [place];  Service: Neurosurgery;  Laterality: N/A;  Cervical five-six, six-seven Anterior Cervical Decompression Fusion   CATARACT EXTRACTION     FEMUR IM NAIL Left 08/16/2021   Procedure: INTRAMEDULLARY (IM) NAIL FEMORAL;  Surgeon: Earnestine Leys, MD;  Location: [facility];  Service: Orthopedics;  Laterality: Left;   IR IVC FILTER PLMT / S&I /IMG GUID/MOD SED  12/08/2017   Patient Active Problem List   Diagnosis Date Noted   Closed hip fracture ([place]) 08/15/2021   DVT (deep venous thrombosis) ([place]) 08/15/2021   Quadriplegia ([place]) 08/15/2021   Fall 08/15/2021   Acute  appendicitis with appendiceal abscess 05/30/2018   Hypocalcemia 02/15/2018   Dysuria 02/15/2018   Bilateral lower extremity edema 02/11/2018   Vaginal yeast infection 01/30/2018   Chest tightness 01/27/2018   Healthcare-associated pneumonia 01/25/2018   Pleural effusion, not elsewhere classified 01/25/2018   Acute deep vein thrombosis (DVT) of left lower extremity ([place]) 01/24/2018   Acute deep vein thrombosis (DVT) of right lower extremity ([place]) 01/24/2018   Chronic allergic rhinitis 01/24/2018   Depression with anxiety 01/24/2018   UTI due to Klebsiella species 01/24/2018   Protein-calorie malnutrition, severe ([place]) 01/24/2018   S/P insertion of IVC (inferior vena caval) filter 01/24/2018   Reactive depression    Hypokalemia  Leukocytosis    Essential hypertension    Trauma    Tetraparesis (HCC)    Neuropathic pain    Neurogenic bowel    Neurogenic bladder    Benign essential HTN    Acute postoperative anemia due to expected blood loss    Central cord syndrome at C6 level of cervical spinal cord ([place]) 11/29/2017   Central cord syndrome ([place]) 11/29/2017   SCC (squamous cell carcinoma) 04/08/2014      REFERRING DIAG: Central Cord Syndrome at C6 of the Cervical Spinal Cord, Fall with Bilateral Closed Hip Fractures, with ORIF repair with Intramedullary Nailing of the Left Hip Fracture.  THERAPY DIAG:  Muscle weakness (generalized)  Rationale for Evaluation and Treatment Rehabilitation  PERTINENT HISTORY: Patient s/p fall November 29, 2017 resulting in diagnosis of central cord syndrome at C 6 level.  She has had therapy in multiple venues but with recent move to [facility], therapy staff recommended she seek outpatient therapy for her needs. Pt. Had a recent fall with Bilateral Closed Hip Fractures, with ORIF repair with Intramedullary Nailing of the Left Hip Fracture.   PRECAUTIONS: None  SUBJECTIVE:   Pt. Reports that most of her sports teams lost this weekend.  PAIN:   Are you having pain? No     OBJECTIVE:   TODAY'S TREATMENT:    Manual Therapy:   Pt. tolerated soft tissue massage to the volar, and dorsal surface of each digit on the bilateral hands secondary to stiffness. Manual therapy was performed independent of, and in preparation for therapeutic Ex.   Therapeutic Ex.    Pt. worked on [employer], and reciprocal motion using the UBE while seated for 8 min. with minimal resistance. Constant monitoring was provided. Pt. tolerated PROM followed by AROM bilateral wrist extension, PROM for bilateral digit MP, PIP, and DIP flexion, and extension, thumb radial, and palmar abduction in conjunction with moist heat.  Pt. Performed Bilateral UE strengthening with a 2# dowel ex. for shoulder flexion, and chest press exercises.    Pt. had a follow-up orthopedic appointment this past week, and continues to remain nonweightbearing through the right hip. Pt. reports that she needs to follow-up again in 6 weeks. Pt. continues to tolerate manual therapy, and UE ther. ex well today without reports of pain following moist heat modality to the bilateral shoulders, and hands. Pt. is able to achieve increased passive digit flexion on the right. Pt. presents with tightness at the bilateral thumb webspace, and bilateral wrist flexor tightness, as well as digit MP, PIP, and DIP extensor tightness which continues to limit her ability to complete ADL tasks efficiently. Pt. continues to benefit from working on impoving ROM in order to work towards increasing engagement of her bilateral hands during ADLs, and IADL tasks.    PATIENT EDUCATION: Education details: BUE ROM, hand function, UE functioning Person educated: Patient Education method: Explanation and Demonstration Education comprehension: verbalized understanding, returned demonstration, and needs further education   HOME EXERCISE PROGRAM Continue with ongoing HEPs     OT Long Term Goals - 07/06/21 1106  Target Date: 12/07/2021    OT LONG TERM GOAL #1   Title Patient and caregiver will demonstrate understanding of home exercise program for ROM.    Baseline 10/28/2021: Pt. Is attempting exercises/stretches at home. The facility restorative aides have not resumed exercises. 09/14/2021:  Pt. Reports inconsistencies with restorative ROM at  her care home. Pt/caregiver education to be provided about ongoing ROM needs.    Time 12    Period Weeks    Status ongoing     OT LONG TERM GOAL #2   Title Pt.  Will improve BUE strength to be able to sustain her UE's in elevation to efficiently, and effectively perform hair care, and self-grooming tasks.   Baseline 10/28/2021: Right shoulder 3+/5, Left shoulder 4-/5 09/14/2021:  BUE shoulder strength: 3+/5 for shoulder flexion, and abduction.   Time 12    Period Weeks    Status Ongoing               OT LONG TERM GOAL #3   Title Patient will demonstrate improved composite finger flexion to hold deodorant to apply to underarms.    Baseline 10/28/2021:  Pt. Continues to have difficulty holding the deodorant. 01/12/2021: Pt. Presents with limited digit extension. Pt. Is able to initiate holding deodorant, however is unable to hold it while using it.   Time 12    Period Weeks    Status Ongoing   Target Date                  OT LONG TERM GOAL #4   Title Pt. Will improve bilateral wrist extension in preparation for anticipating, and initiating reaching for objects at the table.    Baseline 10/28/2021:  Right 22(38), Left 0(15) 09/14/2021:  Right: 17(35), left 2(15)   Time 12   Period weeks   Status Ongoing   Target Date      OT LONG TERM GOAL #5   Title Pt. will write, and sign her name with 100% legibility, and modified independence.    Baseline 10/28/2021: pt. is able to sign her name, however legibility continues to be inconsistent. Pt. Reports "shaky appearance". 09/14/2021:  Pt. continues to fill out her daily menu and complete puzzles. Pt. Continues to present with limited writing legibility.   Time 12    Period Weeks    Status  New          OT LONG TERM GOAL  #6   TITLE Pt. will turn pages in a book with modified independence.    Baseline 10/28/2021: Pt. Is able to consistently turn book pages. 09/14/2021: Pt. has difficulty turning pages.    Time 12    Period Weeks    Status Achieved                 Plan - 07/22/21 1737     Clinical Impression Statement Pt. had a follow-up orthopedic appointment this past week, and continues to remain nonweightbearing through the right hip. Pt. reports that she needs to follow-up again in 6 weeks. Pt. continues to tolerate manual therapy, and UE ther. ex well today without reports of pain following moist heat modality to the bilateral shoulders, and hands. Pt. is able to achieve increased passive digit flexion on the right. Pt. presents with tightness at the bilateral thumb webspace, and bilateral wrist flexor tightness, as well as digit MP, PIP, and DIP extensor tightness which continues to limit her ability to complete ADL tasks efficiently. Pt. continues to benefit from  working on impoving ROM in order to work towards increasing engagement of he                     OT Occupational Profile and History Detailed Assessment- Review of Records and additional review of physical, cognitive, psychosocial history related to current functional performance    Occupational performance deficits (Please refer to evaluation for details): ADL's;IADL's;Leisure;Social Participation    Body Structure / Function / Physical Skills ADL;Continence;Dexterity;Flexibility;Strength;ROM;Balance;Coordination;FMC;IADL;Endurance;UE functional use;Decreased knowledge of use of DME;[facility]    Psychosocial Skills Coping Strategies    Rehab Potential Fair    Clinical Decision Making Several treatment options, min-mod task modification necessary     Comorbidities Affecting Occupational Performance: Presence of comorbidities impacting occupational performance    Comorbidities impacting occupational performance description: contractures of bilateral hands, dependent transfers,    Modification or Assistance to Complete Evaluation  No modification of tasks or assist necessary to complete eval    OT Frequency 2x / week    OT Duration 12 weeks    OT Treatment/Interventions Self-care/ADL training;Cryotherapy;Therapeutic exercise;DME and/or AE instruction;Balance training;Neuromuscular education;Manual Therapy;Splinting;Moist Heat;Passive range of motion;Therapeutic activities;Patient/family education    Plan continue to progress ROM of digits, wrists and shoulders as it pertains to completion of ADL tasks that pt values.    Consulted and Agree with Plan of Care Patient            Harrel Carina, MS, OTR/L  Harrel Carina, OT 11/23/2021, 12:08 PM

## 2021-11-25 ENCOUNTER — Ambulatory Visit: Payer: Medicare HMO | Admitting: Occupational Therapy

## 2021-11-25 ENCOUNTER — Ambulatory Visit: Payer: Medicare HMO

## 2021-11-25 NOTE — Therapy (Incomplete)
OUTPATIENT PHYSICAL THERAPY NEURO TREATMENT   Patient Name: Sherry [name] MRN: [id number] DOB:[birth year], 87 y.o., female Today's Date: 11/25/2021   PCP: Dewayne Shorter, MD REFERRING PROVIDER: Earnestine Leys         Past Medical History:  Diagnosis Date   Acute blood loss anemia    Arthritis    Cancer ([place])    skin   Central cord syndrome at C6 level of cervical spinal cord ([place]) 11/29/2017   Hypertension    Protein-calorie malnutrition, severe ([place]) 01/24/2018   S/P insertion of IVC (inferior vena caval) filter 01/24/2018   Tetraparesis ([place])    Past Surgical History:  Procedure Laterality Date   ANTERIOR CERVICAL DECOMP/DISCECTOMY FUSION N/A 11/29/2017   Procedure: Cervical five-six, six-seven Anterior Cervical Decompression Fusion;  Surgeon: Judith Part, MD;  Location: [place];  Service: Neurosurgery;  Laterality: N/A;  Cervical five-six, six-seven Anterior Cervical Decompression Fusion   CATARACT EXTRACTION     FEMUR IM NAIL Left 08/16/2021   Procedure: INTRAMEDULLARY (IM) NAIL FEMORAL;  Surgeon: Earnestine Leys, MD;  Location: [facility];  Service: Orthopedics;  Laterality: Left;   IR IVC FILTER PLMT / S&I /IMG GUID/MOD SED  12/08/2017   Patient Active Problem List   Diagnosis Date Noted   Closed hip fracture ([place]) 08/15/2021   DVT (deep venous thrombosis) ([place]) 08/15/2021   Quadriplegia ([place]) 08/15/2021   Fall 08/15/2021   Acute appendicitis with appendiceal abscess 05/30/2018   Hypocalcemia 02/15/2018   Dysuria 02/15/2018   Bilateral lower extremity edema 02/11/2018   Vaginal yeast infection 01/30/2018   Chest tightness 01/27/2018   Healthcare-associated pneumonia 01/25/2018   Pleural effusion, not elsewhere classified 01/25/2018   Acute deep vein thrombosis (DVT) of left lower extremity ([place]) 01/24/2018   Acute deep vein thrombosis (DVT) of right lower extremity ([place]) 01/24/2018   Chronic allergic rhinitis 01/24/2018   Depression with anxiety  01/24/2018   UTI due to Klebsiella species 01/24/2018   Protein-calorie malnutrition, severe ([place]) 01/24/2018   S/P insertion of IVC (inferior vena caval) filter 01/24/2018   Reactive depression    Hypokalemia    Leukocytosis    Essential hypertension    Trauma    Tetraparesis (HCC)    Neuropathic pain    Neurogenic bowel    Neurogenic bladder    Benign essential HTN    Acute postoperative anemia due to expected blood loss    Central cord syndrome at C6 level of cervical spinal cord ([place]) 11/29/2017   Central cord syndrome ([place]) 11/29/2017   SCC (squamous cell carcinoma) 04/08/2014    ONSET DATE: 08/15/2021 (fall with B hip fx); Initial injury was in 2019- quadraplegia due to central cord syndrom of C6 secondary to neck fracture.   REFERRING DIAG: Bilateral Hip fx; Left Open- s/p Left IM nail femoral on 08/16/2021; Right closed  THERAPY DIAG:  No diagnosis found.  Rationale for Evaluation and Treatment Rehabilitation  SUBJECTIVE:  SUBJECTIVE STATEMENT:   Pt reported: I have new orders from my doctor. I don't understand. Need to clarify."  Pt accompanied by:  Paid caregiver-   PERTINENT HISTORY: Patient was previous patient (see previous episode for specifics) She experienced a fall at home on 08/15/2021 and was in [facility] from 08/15/2021- 08/20/2021.  Most recent history per Dr. Laurey Arrow on 08/20/2021.  Sherry [name] is a 87 y.o. 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   Patient has history of quadriplegia, and has been doing PT/OT with improvement of function.  Pt has some use of both arms and can feed herself. She can walk a little bit with assistance. Pt accidentally fell at about 930 this morning after taking shower.  She injured her hips. Hospital Course:    Closed left hip fracture [facility]) s/p mechanical fall Closed right hip fracture (?old) Acute blood loss anemia --pt is s/p  INTRAMEDULLARY (IM) NAIL FEMORAL-left on 7/16  PAIN:  Are you having pain? NO: NPRS scale: 0/10 Pain location: Right hip Pain description: Ache Aggravating factors: any active Movement of right side Relieving factors: Rest  PRECAUTIONS: Fall  WEIGHT BEARING RESTRICTIONS Yes Bilateral non-weight bearing B LE's  FALLS: Has patient fallen in last 6 months? Yes. Number of falls 1  LIVING ENVIRONMENT: Lives with: lives with their family and lives in a nursing home Lives in: Other Long term care at [place]: No Has following equipment at home: Wheelchair (power), Ramped entry, and hoyer lift  PLOF: Needs assistance with ADLs, Needs assistance with homemaking, Needs assistance with gait, Needs assistance with transfers, and Leisure: Work puzzles, read, Watch TV  PATIENT GOALS Patient goal is to walk; Be able to strengthen my arms for better use  OBJECTIVE:   DIAGNOSTIC FINDINGS: Overall cognitive statusEXAM: DG HIP (WITH OR WITHOUT PELVIS) 2-3V LEFT; DG HIP (WITH OR WITHOUT PELVIS) 2-3V RIGHT   COMPARISON:  CT of the abdomen and pelvis 01/21/2020   FINDINGS: Left proximal femur fracture noted just below the intertrochanteric region. Marked ventral angulation noted on the cross-table image. Slight varus angulation present   Comminuted intratrochanteric fracture present on the right also with marked ventral angulation.   IMPRESSION: 1. Bilateral hip fractures. 2. Comminuted intertrochanteric fracture of the right hip. 3. Angulated fracture of the proximal left femur just below the intertrochanteric region.     Electronically Signed   By:  San Morelle M.D.   On: 08/15/2021 11:54:   COGNITION:  Within functional limits for tasks assessed   SENSATION: Light touch: Impaired   EDEMA:  None  Strength R/L 2-/2- Shoulder flexion (anterior deltoid/pec major/coracobrachialis, axillary n. (C5-6) and musculocutaneous n. (C5-7)) 2-/3+ (within available ROM around 130 on lef)Shoulder abduction (deltoid/supraspinatus, axillary/suprascapular n, C5) 3+/3+ Shoulder external rotation (infraspinatus/teres minor) 4/4 Shoulder internal rotation (subcapularis/lats/pec major) 3+/3+ Shoulder extension (posterior deltoid, lats, teres major, axillary/thoracodorsal n.) 3+/3+ Shoulder horizontal abduction 4/4 Elbow flexion (biceps brachii, brachialis, brachioradialis, musculoskeletal n, C5-6) 4/4 Elbow extension (triceps, radial n, C7) 2-/2- Wrist Extension 3+/3+ Wrist Flexion   LOWER EXTREMITY MMT:    MMT Right Eval Left Eval  Hip flexion    Hip extension    Hip abduction    Hip adduction    Hip internal rotation    Hip external rotation    Knee  flexion    Knee extension    Ankle dorsiflexion    Ankle plantarflexion    Ankle inversion    Ankle eversion    (Blank rows = not tested) - BLE not tested secondary to weight bearing precaution of B LE NWB  BED MOBILITY:  Unable to test due to NWB status of BLE- Patient currently dependent with all bed mobility using hoyer and caregiver assist  TRANSFERS: Currently BLE NWB- Dependent on use of hoyer for all transfers  FUNCTIONAL TESTs:   PATIENT SURVEYS:  FOTO 12  TODAY'S TREATMENT:  11/23/2021 Pt received in Motorized chair agreeable to begin BLE exs as per MR orders. PT discussed new orders to improve carryover outside therapy. PT introduced pt to BLE isometrics and Knee ROM to BLE  including:  -Glute sets 1 x 10 reps 5 secs hold  -Quads sets 5 secs hold 1 x 10 reps each side -Hamstring sets 5 secs hold 1 x 10 reps each side -FAQ AROM to LLE 2 x 10 reps and  AAROM to RLE  2 x 10 reps  -Seated Iliopsoas activation 1 x 10 reps 5 secs hold -Hip abduction 1 x 10 reps 5 secs hold  -Pressure relief technique 5 x both side with mod assist to clear glute -Shldr shrugs #1.5 2 x 10 reps -Seated push ups with manual resistance 1 x 10 reps each side.  -PT educated pt regarding the significance of HEP to BLE , pressure relief technique and provided HEP in H/O.          PATIENT EDUCATION: Education details: PT plan of care/use of resistive bands Person educated: Patient Education method: Explanation, Demonstration, Tactile cues, and Verbal cues Education comprehension: verbalized understanding, returned demonstration, verbal cues required, tactile cues required, and needs further education   HOME EXERCISE PROGRAM: Discussed use of theraband and demo shoulder flex/abd and elbow flex and ext with use of yellow TB. Patient required assist to position therband but able to perform well with only one person assist  Access Code: [facility] URL: [email or web address] Date: 11/23/2021 Prepared by: Janna Arch Exercises - Seated Gluteal Sets - 2 x daily - 7 x weekly - 2 sets - 10 reps - 5 hold - Seated Quad Set - 2 x daily - 7 x weekly - 2 sets - 10 reps - 5 hold - Seated Long Arc Quad - 2 x daily - 7 x weekly - 2 sets - 10 reps - 5 hold - Seated March - 2 x daily - 7 x weekly - 2 sets - 10 reps - 5 hold - Seated Hip Abduction - 2 x daily - 7 x weekly - 2 sets - 10 reps - 5 hold - Seated Shoulder Shrugs - 2 x daily - 7 x weekly - 2 sets - 10 reps - 5 hold - Wheelchair Pressure Relief - 2 x daily - 7 x weekly - 2 sets - 10 reps - 5 hold  GOALS: Goals reviewed with patient? Yes  SHORT TERM GOALS: Target date: 01/06/2022  Pt will be independent with initial UE strengthening HEP in order to improve strength and balance in order to decrease fall risk and improve function at home and work. Baseline: 10/19/2021- patient with no formal UE HEP Goal status:  INITIAL  LONG TERM GOALS: Target date: 02/17/2022  Pt will be independent with final for UE/LE HEP in order to improve strength and balance in order to decrease fall risk and improve function at home and work.  Baseline: Patient is currently BLE NWB and unable to participate in HEP. Has order for UE strengthening. Goal status: INITIAL  2.  Pt will improve FOTO to target score of 35  to display perceived improvements in ability to complete ADL's.  Baseline: 10/19/2021= 12  Goal status: INITIAL  3.  Pt will increase strength of B UE  by at least 1/2 MMT grade in order to demonstrate improvement in strength and function  Baseline: patient has range of 2-/5 to 4/5 BUE Strength Goal status: INITIAL  ASSESSMENT:  CLINICAL IMPRESSION: Pt received in chair agreeable to participate in therapy. PT discussed New MD orders and explained the significance of strengthen in exs to BLE. Pt is healing well and is  now allowed to begin exs to BLE but remains NWB to BLE until next MD consultation in 6 weeks. Pt demonstrates  MMTof 1+/5 in RLE  grossly,  3 in L quads, 2+ in L hip muscles. Pt introduced to isometrics, AROM and AAROM in BLE. Pt remained asymptomatic through out the session. Pt has pressure sore in L gluteal fold. Pt education regarding HEP and pressure relief technique. Pt needs Mod assist to clear the buttock 2/2 to weakness but able ot perform the task properly. Pt provided with a printout of HEP. Pt demonstrated good understanding of the HEP and education. Pt will continue to benefit from skilled PT services to improve strength in anticipation for BLE clearance for [place] activities.   OBJECTIVE IMPAIRMENTS Abnormal gait, decreased activity tolerance, decreased balance, decreased coordination, decreased endurance, decreased mobility, difficulty walking, decreased ROM, decreased strength, hypomobility, impaired flexibility, impaired UE functional use, postural dysfunction, and pain.   ACTIVITY  LIMITATIONS carrying, lifting, bending, sitting, standing, squatting, sleeping, stairs, transfers, bed mobility, continence, bathing, toileting, dressing, self feeding, reach over head, hygiene/grooming, and caring for others  PARTICIPATION LIMITATIONS: meal prep, cleaning, laundry, medication management, personal finances, interpersonal relationship, driving, shopping, community activity, and yard work  PERSONAL FACTORS Age, Time since onset of injury/illness/exacerbation, and 1-2 comorbidities: HTN, cervical Sx  are also affecting patient's functional outcome.   REHAB POTENTIAL: Good  CLINICAL DECISION MAKING: Evolving/moderate complexity  EVALUATION COMPLEXITY: Moderate  PLAN: PT FREQUENCY: 2x/week  PT DURATION: 12 weeks  PLANNED INTERVENTIONS: Therapeutic exercises, Therapeutic activity, Neuromuscular re-education, Balance training, Gait training, Patient/Family education, Self Care, Joint mobilization, DME instructions, Dry Needling, Electrical stimulation, Wheelchair mobility training, Spinal mobilization, Cryotherapy, Moist heat, and Manual therapy  PLAN FOR NEXT SESSION: Instruct in home program for UE strengthening  Joaquin Music PT DPT 7:51 AM,11/25/21  Physical Therapist- [facility]  11/25/2021, 7:51 AM

## 2021-11-26 ENCOUNTER — Other Ambulatory Visit: Payer: Self-pay | Admitting: Nurse Practitioner

## 2021-11-26 LAB — CBC AND DIFFERENTIAL
HCT: 32 — AB (ref 36–46)
Hemoglobin: 10.5 — AB (ref 12.0–16.0)
Platelets: 346 10*3/uL (ref 150–400)
WBC: 6.3

## 2021-11-26 LAB — IRON,TIBC AND FERRITIN PANEL: Iron: 21

## 2021-11-26 LAB — CBC: RBC: 3.57 — AB (ref 3.87–5.11)

## 2021-11-26 MED ORDER — OXYCODONE-ACETAMINOPHEN 5-325 MG PO TABS: 1.0000 | ORAL_TABLET | Freq: Three times a day (TID) | ORAL | 0 refills | Status: DC | PRN

## 2021-11-30 ENCOUNTER — Ambulatory Visit: Payer: Medicare HMO

## 2021-11-30 ENCOUNTER — Ambulatory Visit: Payer: Medicare HMO | Admitting: Occupational Therapy

## 2021-11-30 ENCOUNTER — Encounter: Payer: Self-pay | Admitting: Occupational Therapy

## 2021-11-30 DIAGNOSIS — M6281 Muscle weakness (generalized): Secondary | ICD-10-CM

## 2021-11-30 DIAGNOSIS — R278 Other lack of coordination: Secondary | ICD-10-CM

## 2021-11-30 DIAGNOSIS — R269 Unspecified abnormalities of gait and mobility: Secondary | ICD-10-CM

## 2021-11-30 DIAGNOSIS — R262 Difficulty in walking, not elsewhere classified: Secondary | ICD-10-CM

## 2021-11-30 DIAGNOSIS — R2689 Other abnormalities of gait and mobility: Secondary | ICD-10-CM

## 2021-11-30 DIAGNOSIS — S72001A Fracture of unspecified part of neck of right femur, initial encounter for closed fracture: Secondary | ICD-10-CM

## 2021-11-30 DIAGNOSIS — S14129S Central cord syndrome at unspecified level of cervical spinal cord, sequela: Secondary | ICD-10-CM

## 2021-11-30 DIAGNOSIS — R2681 Unsteadiness on feet: Secondary | ICD-10-CM

## 2021-11-30 NOTE — Therapy (Signed)
OUTPATIENT PHYSICAL THERAPY NEURO TREATMENT   Patient Name: Sherry [name] MRN: [id number] DOB:03/22/1934, 87 y.o., female Today's Date: 11/30/2021   PCP: Dewayne Shorter, MD REFERRING PROVIDER: Earnestine Leys   PT End of Session - 11/30/21 1054     Visit Number 9    Number of Visits 24    Date for PT Re-Evaluation 01/11/22    Authorization Time Period Initial cert [id number]    Progress Note Due on Visit 10    PT Start Time 1145    PT Stop Time 1220    PT Time Calculation (min) 35 min    Equipment Utilized During Treatment Gait belt    Activity Tolerance Patient tolerated treatment well    Behavior During Therapy WFL for tasks assessed/performed                  Past Medical History:  Diagnosis Date   Acute blood loss anemia    Arthritis    Cancer ([place])    skin   Central cord syndrome at C6 level of cervical spinal cord ([place]) 11/29/2017   Hypertension    Protein-calorie malnutrition, severe ([place]) 01/24/2018   S/P insertion of IVC (inferior vena caval) filter 01/24/2018   Tetraparesis ([place])    Past Surgical History:  Procedure Laterality Date   ANTERIOR CERVICAL DECOMP/DISCECTOMY FUSION N/A 11/29/2017   Procedure: Cervical five-six, six-seven Anterior Cervical Decompression Fusion;  Surgeon: Judith Part, MD;  Location: [facility];  Service: Neurosurgery;  Laterality: N/A;  Cervical five-six, six-seven Anterior Cervical Decompression Fusion   CATARACT EXTRACTION     FEMUR IM NAIL Left 08/16/2021   Procedure: INTRAMEDULLARY (IM) NAIL FEMORAL;  Surgeon: Earnestine Leys, MD;  Location: [facility];  Service: Orthopedics;  Laterality: Left;   IR IVC FILTER PLMT / S&I /IMG GUID/MOD SED  12/08/2017   Patient Active Problem List   Diagnosis Date Noted   Closed hip fracture ([place]) 08/15/2021   DVT (deep venous thrombosis) ([place]) 08/15/2021   Quadriplegia ([place]) 08/15/2021   Fall 08/15/2021   Acute appendicitis with appendiceal abscess 05/30/2018    Hypocalcemia 02/15/2018   Dysuria 02/15/2018   Bilateral lower extremity edema 02/11/2018   Vaginal yeast infection 01/30/2018   Chest tightness 01/27/2018   Healthcare-associated pneumonia 01/25/2018   Pleural effusion, not elsewhere classified 01/25/2018   Acute deep vein thrombosis (DVT) of left lower extremity ([place]) 01/24/2018   Acute deep vein thrombosis (DVT) of right lower extremity ([place]) 01/24/2018   Chronic allergic rhinitis 01/24/2018   Depression with anxiety 01/24/2018   UTI due to Klebsiella species 01/24/2018   Protein-calorie malnutrition, severe ([place]) 01/24/2018   S/P insertion of IVC (inferior vena caval) filter 01/24/2018   Reactive depression    Hypokalemia    Leukocytosis    Essential hypertension    Trauma    Tetraparesis (HCC)    Neuropathic pain    Neurogenic bowel    Neurogenic bladder    Benign essential HTN    Acute postoperative anemia due to expected blood loss    Central cord syndrome at C6 level of cervical spinal cord ([place]) 11/29/2017   Central cord syndrome ([place]) 11/29/2017   SCC (squamous cell carcinoma) 04/08/2014    ONSET DATE: 08/15/2021 (fall with B hip fx); Initial injury was in 2019- quadraplegia due to central cord syndrom of C6 secondary to neck fracture.   REFERRING DIAG: Bilateral Hip fx; Left Open- s/p Left IM nail femoral on 08/16/2021; Right closed  THERAPY DIAG:  Muscle weakness (generalized)  Abnormality of gait and mobility  Difficulty in walking, not elsewhere classified  Other abnormalities of gait and mobility  Closed fracture of both hips, initial encounter (HCC)  Unsteadiness on feet  Other lack of coordination  Central cord syndrome, sequela (HCC)  Rationale for Evaluation and Treatment Rehabilitation  SUBJECTIVE:                                                                                                                                                                                               SUBJECTIVE STATEMENT:   Patient reports feeling okay today- denies any pain or issues.   Pt accompanied by:  Paid caregiver-   PERTINENT HISTORY: Patient was previous patient (see previous episode for specifics) She experienced a fall at home on 08/15/2021 and was in [facility] from 08/15/2021- 08/20/2021.  Most recent history per Dr. Laurey Arrow on 08/20/2021.  Sherry [name] is a 87 y.o. 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   Patient has history of quadriplegia, and has been doing PT/OT with improvement of function. Pt has some use of both arms and can feed herself. She can walk a little bit with assistance. Pt accidentally fell at about 930 this morning after taking shower.  She injured her hips. Hospital Course:    Closed left hip fracture [facility]) s/p mechanical fall Closed right hip fracture (?old) Acute blood loss anemia --pt is s/p  INTRAMEDULLARY (IM) NAIL FEMORAL-left on 7/16  PAIN:  Are you having pain? NO: NPRS scale: 0/10 Pain location: Right hip Pain description: Ache Aggravating factors: any active Movement of right side Relieving factors: Rest  PRECAUTIONS: Fall  WEIGHT BEARING RESTRICTIONS Yes Bilateral non-weight bearing B LE's  FALLS: Has patient fallen in last 6 months? Yes. Number of falls 1  LIVING ENVIRONMENT: Lives with: lives with their family and lives in a nursing home Lives in: Other Long term care at [place]: No Has following equipment at home: Wheelchair (power), Ramped entry, and hoyer lift  PLOF: Needs assistance with ADLs, Needs assistance with homemaking, Needs assistance with gait, Needs assistance with transfers, and Leisure: Work puzzles, read, Watch TV  PATIENT GOALS Patient goal is to walk; Be able to  strengthen my arms for better use  OBJECTIVE:   DIAGNOSTIC FINDINGS: Overall cognitive statusEXAM: DG HIP (WITH OR WITHOUT PELVIS) 2-3V LEFT; DG HIP (WITH OR WITHOUT PELVIS) 2-3V RIGHT   COMPARISON:  CT  of the abdomen and pelvis 01/21/2020   FINDINGS: Left proximal femur fracture noted just below the intertrochanteric region. Marked ventral angulation noted on the cross-table image. Slight varus angulation present   Comminuted intratrochanteric fracture present on the right also with marked ventral angulation.   IMPRESSION: 1. Bilateral hip fractures. 2. Comminuted intertrochanteric fracture of the right hip. 3. Angulated fracture of the proximal left femur just below the intertrochanteric region.     Electronically Signed   By: San Morelle M.D.   On: 08/15/2021 11:54:   COGNITION:  Within functional limits for tasks assessed   SENSATION: Light touch: Impaired   EDEMA:  None  Strength R/L 2-/2- Shoulder flexion (anterior deltoid/pec major/coracobrachialis, axillary n. (C5-6) and musculocutaneous n. (C5-7)) 2-/3+ (within available ROM around 130 on lef)Shoulder abduction (deltoid/supraspinatus, axillary/suprascapular n, C5) 3+/3+ Shoulder external rotation (infraspinatus/teres minor) 4/4 Shoulder internal rotation (subcapularis/lats/pec major) 3+/3+ Shoulder extension (posterior deltoid, lats, teres major, axillary/thoracodorsal n.) 3+/3+ Shoulder horizontal abduction 4/4 Elbow flexion (biceps brachii, brachialis, brachioradialis, musculoskeletal n, C5-6) 4/4 Elbow extension (triceps, radial n, C7) 2-/2- Wrist Extension 3+/3+ Wrist Flexion   LOWER EXTREMITY MMT:    MMT Right Eval Left Eval  Hip flexion    Hip extension    Hip abduction    Hip adduction    Hip internal rotation    Hip external rotation    Knee flexion    Knee extension    Ankle dorsiflexion    Ankle plantarflexion    Ankle inversion    Ankle eversion    (Blank rows = not  tested) - BLE not tested secondary to weight bearing precaution of B LE NWB  BED MOBILITY:  Unable to test due to NWB status of BLE- Patient currently dependent with all bed mobility using hoyer and caregiver assist  TRANSFERS: Currently BLE NWB- Dependent on use of hoyer for all transfers  FUNCTIONAL TESTs:   PATIENT SURVEYS:  FOTO 12  TODAY'S TREATMENT:  11/30/2021 Pt received in Motorized chair agreeable to begin BLE exs as per MR orders. - COPIED FROM [facility]- DR. Jonny Ruiz Note from 11/18/2021:  11/18/2021 11/18/2021 Assessment and Plan:  Bilateral hip fractures.   Recommendation:  The patient's PT may start with range of motion and more activity with the hips. I would like to avoid weightbearing still. We will see her back here in 6 weeks for an exam and x-ray of both hips.    PT reviewed these orders to clearly define MD expectation and for all carryover outside therapy.  -Glute sets 1 x 10 reps 5 secs hold  -Quads sets 5 secs hold 1 x 10 reps each side -Hamstring sets 5 secs hold 1 x 10 reps each side -SAQ AROM to LLE 2 x 10 reps and AAROM to RLE  2 x 10 reps  -Hip abduction 1 x 10 reps AAROM (slow and controlled with no pain reported)   -Hip adduction squeeze with red soft ball- hold 5 sec x 10 reps  -Pressure relief technique 5 x both side with mod assist to clear glute -Shldr shrugs #1.5 2 x 10 reps -Seated push ups (shoulder depression)  1 x 10 reps each side.  -Seated LAQ (AAROM On right) x 10 reps x 2 sets - Seated hip march (AAROM) x 10 reps x 2 sets          PATIENT EDUCATION: Education details: PT plan of care/use of resistive bands Person educated: Patient Education method: Explanation, Demonstration, Tactile cues, and  Verbal cues Education comprehension: verbalized understanding, returned demonstration, verbal cues required, tactile cues required, and needs further education   HOME EXERCISE PROGRAM: Discussed use of theraband and demo  shoulder flex/abd and elbow flex and ext with use of yellow TB. Patient required assist to position therband but able to perform well with only one person assist  Access Code: [facility] URL: [email or web address] Date: 11/23/2021 Prepared by: Janna Arch Exercises - Seated Gluteal Sets - 2 x daily - 7 x weekly - 2 sets - 10 reps - 5 hold - Seated Quad Set - 2 x daily - 7 x weekly - 2 sets - 10 reps - 5 hold - Seated Long Arc Quad - 2 x daily - 7 x weekly - 2 sets - 10 reps - 5 hold - Seated March - 2 x daily - 7 x weekly - 2 sets - 10 reps - 5 hold - Seated Hip Abduction - 2 x daily - 7 x weekly - 2 sets - 10 reps - 5 hold - Seated Shoulder Shrugs - 2 x daily - 7 x weekly - 2 sets - 10 reps - 5 hold - Wheelchair Pressure Relief - 2 x daily - 7 x weekly - 2 sets - 10 reps - 5 hold  GOALS: Goals reviewed with patient? Yes  SHORT TERM GOALS: Target date: 01/11/2022  Pt will be independent with initial UE strengthening HEP in order to improve strength and balance in order to decrease fall risk and improve function at home and work. Baseline: 10/19/2021- patient with no formal UE HEP Goal status: INITIAL  LONG TERM GOALS: Target date: 02/22/2022  Pt will be independent with final for UE/LE HEP in order to improve strength and balance in order to decrease fall risk and improve function at home and work. Baseline: Patient is currently BLE NWB and unable to participate in HEP. Has order for UE strengthening. Goal status: INITIAL  2.  Pt will improve FOTO to target score of 35  to display perceived improvements in ability to complete ADL's.  Baseline: 10/19/2021= 12  Goal status: INITIAL  3.  Pt will increase strength of B UE  by at least 1/2 MMT grade in order to demonstrate improvement in strength and function  Baseline: patient has range of 2-/5 to 4/5 BUE Strength Goal status: INITIAL  ASSESSMENT:  CLINICAL IMPRESSION: Patient presented with good motivation for today's session.  She was able to follow VC and TC for all of today's exercises without significant issues. She reported fatigue as limiting factor. She demonstrated good working knowledge of rationale for isometrics and AAROM and stated no pain today with more active hip mobility.   Pt will continue to benefit from skilled PT services to improve strength in anticipation for BLE clearance for [place] activities.   OBJECTIVE IMPAIRMENTS Abnormal gait, decreased activity tolerance, decreased balance, decreased coordination, decreased endurance, decreased mobility, difficulty walking, decreased ROM, decreased strength, hypomobility, impaired flexibility, impaired UE functional use, postural dysfunction, and pain.   ACTIVITY LIMITATIONS carrying, lifting, bending, sitting, standing, squatting, sleeping, stairs, transfers, bed mobility, continence, bathing, toileting, dressing, self feeding, reach over head, hygiene/grooming, and caring for others  PARTICIPATION LIMITATIONS: meal prep, cleaning, laundry, medication management, personal finances, interpersonal relationship, driving, shopping, community activity, and yard work  PERSONAL FACTORS Age, Time since onset of injury/illness/exacerbation, and 1-2 comorbidities: HTN, cervical Sx  are also affecting patient's functional outcome.   REHAB POTENTIAL: Good  CLINICAL DECISION MAKING: Evolving/moderate complexity  EVALUATION COMPLEXITY: Moderate  PLAN: PT FREQUENCY: 2x/week  PT DURATION: 12 weeks  PLANNED INTERVENTIONS: Therapeutic exercises, Therapeutic activity, Neuromuscular re-education, Balance training, Gait training, Patient/Family education, Self Care, Joint mobilization, DME instructions, Dry Needling, Electrical stimulation, Wheelchair mobility training, Spinal mobilization, Cryotherapy, Moist heat, and Manual therapy  PLAN FOR NEXT SESSION: Instruct in home program for UE strengthening  Ollen Bowl, PT 1:15 PM,11/30/21  Physical Therapist- [facility]  11/30/2021, 1:15 PM

## 2021-11-30 NOTE — Therapy (Signed)
Occupational Therapy Treatment Note    Patient Name: Sherry [name] MRN: [id number] DOB:1934-08-25, 87 y.o., female Today's Date: 11/30/2021                                                                                                                                                         PCP: Dr. Viviana Simpler REFERRING PROVIDER: Dr. Viviana Simpler   OT End of Session - 11/30/21 1732     Visit Number 16    Number of Visits 24    Date for OT Re-Evaluation 12/07/21    Authorization Time Period Progress report period starting 09/14/2021    OT Start Time 2    OT Stop Time 1145    OT Time Calculation (min) 45 min    Equipment Utilized During Treatment moist heat    Activity Tolerance Patient tolerated treatment well    Behavior During Therapy WFL for tasks assessed/performed             Past Medical History:  Diagnosis Date   Acute blood loss anemia    Arthritis    Cancer ([place])    skin   Central cord syndrome at C6 level of cervical spinal cord ([place]) 11/29/2017   Hypertension    Protein-calorie malnutrition, severe ([place]) 01/24/2018   S/P insertion of IVC (inferior vena caval) filter 01/24/2018   Tetraparesis ([place])    Past Surgical History:  Procedure Laterality Date   ANTERIOR CERVICAL DECOMP/DISCECTOMY FUSION N/A 11/29/2017   Procedure: Cervical five-six, six-seven Anterior Cervical Decompression Fusion;  Surgeon: Judith Part, MD;  Location: [facility];  Service: Neurosurgery;  Laterality: N/A;  Cervical five-six, six-seven Anterior Cervical Decompression Fusion   CATARACT EXTRACTION     FEMUR IM NAIL Left 08/16/2021   Procedure: INTRAMEDULLARY (IM) NAIL FEMORAL;  Surgeon: Earnestine Leys, MD;  Location: [facility];  Service: Orthopedics;  Laterality: Left;   IR IVC FILTER PLMT / S&I /IMG GUID/MOD SED  12/08/2017   Patient Active Problem List   Diagnosis Date Noted   Closed hip fracture ([place]) 08/15/2021   DVT (deep venous thrombosis) ([place]) 08/15/2021    Quadriplegia ([place]) 08/15/2021   Fall 08/15/2021   Acute appendicitis with appendiceal abscess 05/30/2018   Hypocalcemia 02/15/2018   Dysuria 02/15/2018   Bilateral lower extremity edema 02/11/2018   Vaginal yeast infection 01/30/2018   Chest tightness 01/27/2018   Healthcare-associated pneumonia 01/25/2018   Pleural effusion, not elsewhere classified 01/25/2018   Acute deep vein thrombosis (DVT) of left lower extremity ([place]) 01/24/2018   Acute deep vein thrombosis (DVT) of right lower extremity ([place]) 01/24/2018   Chronic allergic rhinitis 01/24/2018   Depression with anxiety 01/24/2018   UTI due to Klebsiella species 01/24/2018   Protein-calorie malnutrition, severe ([place]) 01/24/2018   S/P insertion of IVC (inferior vena caval) filter 01/24/2018  Reactive depression    Hypokalemia    Leukocytosis    Essential hypertension    Trauma    Tetraparesis (HCC)    Neuropathic pain    Neurogenic bowel    Neurogenic bladder    Benign essential HTN    Acute postoperative anemia due to expected blood loss    Central cord syndrome at C6 level of cervical spinal cord ([place]) 11/29/2017   Central cord syndrome ([place]) 11/29/2017   SCC (squamous cell carcinoma) 04/08/2014      REFERRING DIAG: Central Cord Syndrome at C6 of the Cervical Spinal Cord, Fall with Bilateral Closed Hip Fractures, with ORIF repair with Intramedullary Nailing of the Left Hip Fracture.  THERAPY DIAG:  Muscle weakness (generalized)  Rationale for Evaluation and Treatment Rehabilitation  PERTINENT HISTORY: Patient s/p fall November 29, 2017 resulting in diagnosis of central cord syndrome at C 6 level.  She has had therapy in multiple venues but with recent move to [facility], therapy staff recommended she seek outpatient therapy for her needs. Pt. Had a recent fall with Bilateral Closed Hip Fractures, with ORIF repair with Intramedullary Nailing of the Left Hip Fracture.   PRECAUTIONS: None  SUBJECTIVE:   Pt. Reports  that most of her sports teams lost this weekend.  PAIN:  Are you having pain? No     OBJECTIVE:   TODAY'S TREATMENT:    Manual Therapy:   Pt. tolerated soft tissue massage to the volar, and dorsal surface of each digit on the bilateral hands secondary to stiffness. Manual therapy was performed independent of, and in preparation for therapeutic Ex.   Therapeutic Ex.    Pt. worked on [employer], and reciprocal motion using the UBE while seated for 8 min. with minimal resistance. Constant monitoring was provided. Pt. tolerated PROM followed by AROM bilateral wrist extension, PROM for bilateral digit MP, PIP, and DIP flexion, and extension, thumb radial, and palmar abduction in conjunction with moist heat.     Pt. continues to tolerate manual therapy, and UE ther. ex well today without reports of pain following moist heat modality to the bilateral shoulders, and hands. Pt. is able to achieve increased passive digit flexion on the right. Pt. presents with tightness at the bilateral thumb webspace, and bilateral wrist flexor tightness, as well as digit MP, PIP, and DIP extensor tightness which continues to limit her ability to complete ADL tasks efficiently. Pt. continues to benefit from working on impoving ROM in order to work towards increasing engagement of her bilateral hands during ADLs, and IADL tasks.    PATIENT EDUCATION: Education details: BUE ROM, hand function, UE functioning Person educated: Patient Education method: Explanation and Demonstration Education comprehension: verbalized understanding, returned demonstration, and needs further education   HOME EXERCISE PROGRAM Continue with ongoing HEPs     OT Long Term Goals - 07/06/21 1106                                                                                          Target Date: 12/07/2021    OT LONG TERM GOAL #1   Title Patient and caregiver will demonstrate understanding of home  exercise program for ROM.     Baseline 10/28/2021: Pt. Is attempting exercises/stretches at home. The facility restorative aides have not resumed exercises. 09/14/2021:  Pt. Reports inconsistencies with restorative ROM at  her care home. Pt/caregiver education to be provided about ongoing ROM needs.    Time 12    Period Weeks    Status ongoing     OT LONG TERM GOAL #2   Title Pt.  Will improve BUE strength to be able to sustain her UE's in elevation to efficiently, and effectively perform hair care, and self-grooming tasks.   Baseline 10/28/2021: Right shoulder 3+/5, Left shoulder 4-/5 09/14/2021:  BUE shoulder strength: 3+/5 for shoulder flexion, and abduction.   Time 12    Period Weeks    Status Ongoing               OT LONG TERM GOAL #3   Title Patient will demonstrate improved composite finger flexion to hold deodorant to apply to underarms.    Baseline 10/28/2021:  Pt. Continues to have difficulty holding the deodorant. 01/12/2021: Pt. Presents with limited digit extension. Pt. Is able to initiate holding deodorant, however is unable to hold it while using it.   Time 12    Period Weeks    Status Ongoing   Target Date                  OT LONG TERM GOAL #4   Title Pt. Will improve bilateral wrist extension in preparation for anticipating, and initiating reaching for objects at the table.    Baseline 10/28/2021:  Right 22(38), Left 0(15) 09/14/2021:  Right: 17(35), left 2(15)   Time 12   Period weeks   Status Ongoing   Target Date      OT LONG TERM GOAL #5   Title Pt. will write, and sign her name with 100% legibility, and modified independence.    Baseline 10/28/2021: pt. is able to sign her name, however legibility continues to be inconsistent. Pt. Reports "shaky appearance". 09/14/2021: Pt. continues to fill out her daily menu and complete puzzles. Pt. Continues to present with limited writing legibility.   Time 12    Period Weeks    Status  New          OT LONG TERM GOAL  #6   TITLE Pt. will turn pages in  a book with modified independence.    Baseline 10/28/2021: Pt. Is able to consistently turn book pages. 09/14/2021: Pt. has difficulty turning pages.    Time 12    Period Weeks    Status Achieved                 Plan - 07/22/21 1737     Clinical Impression Statement Pt. continues to tolerate manual therapy, and UE ther. ex well today without reports of pain following moist heat modality to the bilateral shoulders, and hands. Pt. is able to achieve increased passive digit flexion on the right. Pt. presents with tightness at the bilateral thumb webspace, and bilateral wrist flexor tightness, as well as digit MP, PIP, and DIP extensor tightness which continues to limit her ability to complete ADL tasks efficiently. Pt. continues to benefit from working on impoving ROM in order to work towards increasing engagement of her bilateral hands during ADLs, and IADL tasks.                        OT Occupational Profile and History Detailed Assessment-  Review of Records and additional review of physical, cognitive, psychosocial history related to current functional performance    Occupational performance deficits (Please refer to evaluation for details): ADL's;IADL's;Leisure;Social Participation    Body Structure / Function / Physical Skills ADL;Continence;Dexterity;Flexibility;Strength;ROM;Balance;Coordination;FMC;IADL;Endurance;UE functional use;Decreased knowledge of use of DME;[facility]    Psychosocial Skills Coping Strategies    Rehab Potential Fair    Clinical Decision Making Several treatment options, min-mod task modification necessary    Comorbidities Affecting Occupational Performance: Presence of comorbidities impacting occupational performance    Comorbidities impacting occupational performance description: contractures of bilateral hands, dependent transfers,    Modification or Assistance to Complete Evaluation  No modification of tasks or assist necessary to complete eval    OT Frequency  2x / week    OT Duration 12 weeks    OT Treatment/Interventions Self-care/ADL training;Cryotherapy;Therapeutic exercise;DME and/or AE instruction;Balance training;Neuromuscular education;Manual Therapy;Splinting;Moist Heat;Passive range of motion;Therapeutic activities;Patient/family education    Plan continue to progress ROM of digits, wrists and shoulders as it pertains to completion of ADL tasks that pt values.    Consulted and Agree with Plan of Care Patient            Harrel Carina, MS, OTR/L  Harrel Carina, OT 11/30/2021, 5:34 PM

## 2021-12-02 ENCOUNTER — Ambulatory Visit: Payer: Medicare HMO | Admitting: Occupational Therapy

## 2021-12-02 ENCOUNTER — Ambulatory Visit: Payer: Medicare HMO | Attending: Internal Medicine

## 2021-12-02 DIAGNOSIS — M6281 Muscle weakness (generalized): Secondary | ICD-10-CM

## 2021-12-02 DIAGNOSIS — S14129S Central cord syndrome at unspecified level of cervical spinal cord, sequela: Secondary | ICD-10-CM | POA: Insufficient documentation

## 2021-12-02 DIAGNOSIS — R269 Unspecified abnormalities of gait and mobility: Secondary | ICD-10-CM

## 2021-12-02 DIAGNOSIS — R262 Difficulty in walking, not elsewhere classified: Secondary | ICD-10-CM | POA: Diagnosis present

## 2021-12-02 DIAGNOSIS — R278 Other lack of coordination: Secondary | ICD-10-CM

## 2021-12-02 DIAGNOSIS — R140 Abdominal distension (gaseous): Secondary | ICD-10-CM | POA: Insufficient documentation

## 2021-12-02 DIAGNOSIS — R2681 Unsteadiness on feet: Secondary | ICD-10-CM | POA: Diagnosis not present

## 2021-12-02 DIAGNOSIS — S72001A Fracture of unspecified part of neck of right femur, initial encounter for closed fracture: Secondary | ICD-10-CM | POA: Diagnosis not present

## 2021-12-02 DIAGNOSIS — S72002A Fracture of unspecified part of neck of left femur, initial encounter for closed fracture: Secondary | ICD-10-CM | POA: Diagnosis not present

## 2021-12-02 DIAGNOSIS — R2689 Other abnormalities of gait and mobility: Secondary | ICD-10-CM | POA: Diagnosis not present

## 2021-12-02 NOTE — Therapy (Signed)
Occupational Therapy Treatment Note    Patient Name: Sherry [name] MRN: [id number] DOB:[birth year], 87 y.o., female Today's Date: 12/02/2021                                                                                                                                                         PCP: Dr. Viviana Simpler REFERRING PROVIDER: Dr. Viviana Simpler   OT End of Session - 12/02/21 1425     Visit Number 17    Number of Visits 24    Date for OT Re-Evaluation 12/07/21    Authorization Time Period Progress report period starting 09/14/2021    OT Start Time 1145    OT Stop Time 1230    OT Time Calculation (min) 45 min    Activity Tolerance Patient tolerated treatment well    Behavior During Therapy [facility] for tasks assessed/performed             Past Medical History:  Diagnosis Date   Acute blood loss anemia    Arthritis    Cancer ([place])    skin   Central cord syndrome at C6 level of cervical spinal cord ([place]) 11/29/2017   Hypertension    Protein-calorie malnutrition, severe ([place]) 01/24/2018   S/P insertion of IVC (inferior vena caval) filter 01/24/2018   Tetraparesis ([place])    Past Surgical History:  Procedure Laterality Date   ANTERIOR CERVICAL DECOMP/DISCECTOMY FUSION N/A 11/29/2017   Procedure: Cervical five-six, six-seven Anterior Cervical Decompression Fusion;  Surgeon: Judith Part, MD;  Location: [facility];  Service: Neurosurgery;  Laterality: N/A;  Cervical five-six, six-seven Anterior Cervical Decompression Fusion   CATARACT EXTRACTION     FEMUR IM NAIL Left 08/16/2021   Procedure: INTRAMEDULLARY (IM) NAIL FEMORAL;  Surgeon: Earnestine Leys, MD;  Location: [facility];  Service: Orthopedics;  Laterality: Left;   IR IVC FILTER PLMT / S&I /IMG GUID/MOD SED  12/08/2017   Patient Active Problem List   Diagnosis Date Noted   Closed hip fracture ([place]) 08/15/2021   DVT (deep venous thrombosis) ([place]) 08/15/2021   Quadriplegia ([place]) 08/15/2021   Fall 08/15/2021   Acute  appendicitis with appendiceal abscess 05/30/2018   Hypocalcemia 02/15/2018   Dysuria 02/15/2018   Bilateral lower extremity edema 02/11/2018   Vaginal yeast infection 01/30/2018   Chest tightness 01/27/2018   Healthcare-associated pneumonia 01/25/2018   Pleural effusion, not elsewhere classified 01/25/2018   Acute deep vein thrombosis (DVT) of left lower extremity ([place]) 01/24/2018   Acute deep vein thrombosis (DVT) of right lower extremity ([place]) 01/24/2018   Chronic allergic rhinitis 01/24/2018   Depression with anxiety 01/24/2018   UTI due to Klebsiella species 01/24/2018   Protein-calorie malnutrition, severe ([place]) 01/24/2018   S/P insertion of IVC (inferior vena caval) filter 01/24/2018   Reactive depression    Hypokalemia  Leukocytosis    Essential hypertension    Trauma    Tetraparesis ([facility])    Neuropathic pain    Neurogenic bowel    Neurogenic bladder    Benign essential HTN    Acute postoperative anemia due to expected blood loss    Central cord syndrome at C6 level of cervical spinal cord ([place]) 11/29/2017   Central cord syndrome ([place]) 11/29/2017   SCC (squamous cell carcinoma) 04/08/2014      REFERRING DIAG: Central Cord Syndrome at C6 of the Cervical Spinal Cord, Fall with Bilateral Closed Hip Fractures, with ORIF repair with Intramedullary Nailing of the Left Hip Fracture.  THERAPY DIAG:  Muscle weakness (generalized)  Other lack of coordination  Rationale for Evaluation and Treatment Rehabilitation  PERTINENT HISTORY: Patient s/p fall November 29, 2017 resulting in diagnosis of central cord syndrome at C 6 level.  She has had therapy in multiple venues but with recent move to [facility], therapy staff recommended she seek outpatient therapy for her needs. Pt. Had a recent fall with Bilateral Closed Hip Fractures, with ORIF repair with Intramedullary Nailing of the Left Hip Fracture.   PRECAUTIONS: None  SUBJECTIVE:   Pt. Reports that most of her sports teams  lost this weekend.  PAIN:  Are you having pain? No     OBJECTIVE:   TODAY'S TREATMENT:    Manual Therapy:   Pt. tolerated soft tissue massage to the volar, and dorsal surface of each digit on the bilateral hands secondary to stiffness. Manual therapy was performed independent of, and in preparation for therapeutic Ex.   Therapeutic Ex.    Pt. worked on [employer], and reciprocal motion using the UBE while seated for 8 min. with minimal resistance. Constant monitoring was provided. Pt. tolerated PROM followed by AROM bilateral wrist extension, PROM for bilateral digit MP, PIP, and DIP flexion, and extension, thumb radial, and palmar abduction in conjunction with moist heat.    Pt. is improving with bilateral hand function skills. Pt. Pt. continues to tolerate manual therapy, and UE ther. ex well today without reports of pain following moist heat modality to the bilateral shoulders, and hands. Pt. Continues to be able to achieve increased passive digit flexion on the right. Pt. presents with tightness at the bilateral thumb webspace, and bilateral wrist flexor tightness, as well as digit MP, PIP, and DIP extensor tightness which continues to limit her ability to complete ADL tasks efficiently. Pt. continues to benefit from working on impoving ROM in order to work towards increasing bilateral hand grasp on objects, and increase engagement of her bilateral hands during ADLs, and IADL tasks.   PATIENT EDUCATION: Education details: BUE ROM, hand function, UE functioning Person educated: Patient Education method: Explanation and Demonstration Education comprehension: verbalized understanding, returned demonstration, and needs further education   HOME EXERCISE PROGRAM Continue with ongoing HEPs     OT Long Term Goals - 07/06/21 1106                                                                                          Target Date: 12/07/2021    OT LONG TERM GOAL #  1   Title  Patient and caregiver will demonstrate understanding of home exercise program for ROM.    Baseline 10/28/2021: Pt. Is attempting exercises/stretches at home. The facility restorative aides have not resumed exercises. 09/14/2021:  Pt. Reports inconsistencies with restorative ROM at  her care home. Pt/caregiver education to be provided about ongoing ROM needs.    Time 12    Period Weeks    Status ongoing     OT LONG TERM GOAL #2   Title Pt.  Will improve BUE strength to be able to sustain her UE's in elevation to efficiently, and effectively perform hair care, and self-grooming tasks.   Baseline 10/28/2021: Right shoulder 3+/5, Left shoulder 4-/5 09/14/2021:  BUE shoulder strength: 3+/5 for shoulder flexion, and abduction.   Time 12    Period Weeks    Status Ongoing               OT LONG TERM GOAL #3   Title Patient will demonstrate improved composite finger flexion to hold deodorant to apply to underarms.    Baseline 10/28/2021:  Pt. Continues to have difficulty holding the deodorant. 01/12/2021: Pt. Presents with limited digit extension. Pt. Is able to initiate holding deodorant, however is unable to hold it while using it.   Time 12    Period Weeks    Status Ongoing   Target Date                  OT LONG TERM GOAL #4   Title Pt. Will improve bilateral wrist extension in preparation for anticipating, and initiating reaching for objects at the table.    Baseline 10/28/2021:  Right 22(38), Left 0(15) 09/14/2021:  Right: 17(35), left 2(15)   Time 12   Period weeks   Status Ongoing   Target Date      OT LONG TERM GOAL #5   Title Pt. will write, and sign her name with 100% legibility, and modified independence.    Baseline 10/28/2021: pt. is able to sign her name, however legibility continues to be inconsistent. Pt. Reports "shaky appearance". 09/14/2021: Pt. continues to fill out her daily menu and complete puzzles. Pt. Continues to present with limited writing legibility.   Time 12    Period  Weeks    Status  New          OT LONG TERM GOAL  #6   TITLE Pt. will turn pages in a book with modified independence.    Baseline 10/28/2021: Pt. Is able to consistently turn book pages. 09/14/2021: Pt. has difficulty turning pages.    Time 12    Period Weeks    Status Achieved                 Plan - 07/22/21 1737     Clinical Impression Statement Pt. is improving with bilateral hand function skills. Pt. Pt. continues to tolerate manual therapy, and UE ther. ex well today without reports of pain following moist heat modality to the bilateral shoulders, and hands. Pt. Continues to be able to achieve increased passive digit flexion on the right. Pt. presents with tightness at the bilateral thumb webspace, and bilateral wrist flexor tightness, as well as digit MP, PIP, and DIP extensor tightness which continues to limit her ability to complete ADL tasks efficiently. Pt. continues to benefit from working on impoving ROM in order to work towards increasing bilateral hand grasp on objects, and increase engagement of her bilateral hands during ADLs, and IADL tasks.  OT Occupational Profile and History Detailed Assessment- Review of Records and additional review of physical, cognitive, psychosocial history related to current functional performance    Occupational performance deficits (Please refer to evaluation for details): ADL's;IADL's;Leisure;Social Participation    Body Structure / Function / Physical Skills ADL;Continence;Dexterity;Flexibility;Strength;ROM;Balance;Coordination;FMC;IADL;Endurance;UE functional use;Decreased knowledge of use of DME;[facility]    Psychosocial Skills Coping Strategies    Rehab Potential Fair    Clinical Decision Making Several treatment options, min-mod task modification necessary    Comorbidities Affecting Occupational Performance: Presence of comorbidities impacting occupational performance    Comorbidities impacting occupational  performance description: contractures of bilateral hands, dependent transfers,    Modification or Assistance to Complete Evaluation  No modification of tasks or assist necessary to complete eval    OT Frequency 2x / week    OT Duration 12 weeks    OT Treatment/Interventions Self-care/ADL training;Cryotherapy;Therapeutic exercise;DME and/or AE instruction;Balance training;Neuromuscular education;Manual Therapy;Splinting;Moist Heat;Passive range of motion;Therapeutic activities;Patient/family education    Plan continue to progress ROM of digits, wrists and shoulders as it pertains to completion of ADL tasks that pt values.    Consulted and Agree with Plan of Care Patient            Harrel Carina, MS, OTR/L  Harrel Carina, OT 12/02/2021, 3:20 PM

## 2021-12-02 NOTE — Therapy (Signed)
OUTPATIENT PHYSICAL THERAPY NEURO TREATMENT/Physical Therapy Progress Note   Dates of reporting period  10/19/2021   to   12/02/2021   Patient Name: Sherry [name] MRN: [id number] DOB:Nov 30, 1934, 87 y.o., female Today's Date: 12/02/2021   PCP: Dewayne Shorter, MD REFERRING PROVIDER: Earnestine Leys   PT End of Session - 12/02/21 1109     Visit Number 10    Number of Visits 24    Date for PT Re-Evaluation 01/11/22    Authorization Time Period Initial cert [id number]    Progress Note Due on Visit 10    PT Start Time 1102    PT Stop Time 1142    PT Time Calculation (min) 40 min    Equipment Utilized During Treatment Gait belt    Activity Tolerance Patient tolerated treatment well    Behavior During Therapy WFL for tasks assessed/performed                  Past Medical History:  Diagnosis Date   Acute blood loss anemia    Arthritis    Cancer ([place])    skin   Central cord syndrome at C6 level of cervical spinal cord ([place]) 11/29/2017   Hypertension    Protein-calorie malnutrition, severe ([place]) 01/24/2018   S/P insertion of IVC (inferior vena caval) filter 01/24/2018   Tetraparesis ([place])    Past Surgical History:  Procedure Laterality Date   ANTERIOR CERVICAL DECOMP/DISCECTOMY FUSION N/A 11/29/2017   Procedure: Cervical five-six, six-seven Anterior Cervical Decompression Fusion;  Surgeon: Judith Part, MD;  Location: [facility];  Service: Neurosurgery;  Laterality: N/A;  Cervical five-six, six-seven Anterior Cervical Decompression Fusion   CATARACT EXTRACTION     FEMUR IM NAIL Left 08/16/2021   Procedure: INTRAMEDULLARY (IM) NAIL FEMORAL;  Surgeon: Earnestine Leys, MD;  Location: [facility];  Service: Orthopedics;  Laterality: Left;   IR IVC FILTER PLMT / S&I /IMG GUID/MOD SED  12/08/2017   Patient Active Problem List   Diagnosis Date Noted   Closed hip fracture ([place]) 08/15/2021   DVT (deep venous thrombosis) ([place]) 08/15/2021   Quadriplegia ([place])  08/15/2021   Fall 08/15/2021   Acute appendicitis with appendiceal abscess 05/30/2018   Hypocalcemia 02/15/2018   Dysuria 02/15/2018   Bilateral lower extremity edema 02/11/2018   Vaginal yeast infection 01/30/2018   Chest tightness 01/27/2018   Healthcare-associated pneumonia 01/25/2018   Pleural effusion, not elsewhere classified 01/25/2018   Acute deep vein thrombosis (DVT) of left lower extremity ([place]) 01/24/2018   Acute deep vein thrombosis (DVT) of right lower extremity ([place]) 01/24/2018   Chronic allergic rhinitis 01/24/2018   Depression with anxiety 01/24/2018   UTI due to Klebsiella species 01/24/2018   Protein-calorie malnutrition, severe ([place]) 01/24/2018   S/P insertion of IVC (inferior vena caval) filter 01/24/2018   Reactive depression    Hypokalemia    Leukocytosis    Essential hypertension    Trauma    Tetraparesis ([facility])    Neuropathic pain    Neurogenic bowel    Neurogenic bladder    Benign essential HTN    Acute postoperative anemia due to expected blood loss    Central cord syndrome at C6 level of cervical spinal cord ([place]) 11/29/2017   Central cord syndrome ([place]) 11/29/2017   SCC (squamous cell carcinoma) 04/08/2014    ONSET DATE: 08/15/2021 (fall with B hip fx); Initial injury was in 2019- quadraplegia due to central cord syndrom of C6 secondary to neck fracture.   REFERRING DIAG: Bilateral Hip fx; Left  Open- s/p Left IM nail femoral on 08/16/2021; Right closed  THERAPY DIAG:  Muscle weakness (generalized)  Abnormality of gait and mobility  Difficulty in walking, not elsewhere classified  Other abnormalities of gait and mobility  Closed fracture of both hips, initial encounter ([place])  Unsteadiness on feet  Other lack of coordination  Rationale for Evaluation and Treatment Rehabilitation  SUBJECTIVE:                                                                                                                                                                                               SUBJECTIVE STATEMENT:   Patient Continues to deny any pelvic pain with sitting or exercises- stating the only time her leg bothers her is when her caregiver move her leg during transfers.    Pt accompanied by:  Paid caregiver-   PERTINENT HISTORY: Patient was previous patient (see previous episode for specifics) She experienced a fall at home on 08/15/2021 and was in [facility] from 08/15/2021- 08/20/2021.  Most recent history per Dr. Laurey Arrow on 08/20/2021.  Sherry [name] is a 87 y.o. 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   Patient has history of quadriplegia, and has been doing PT/OT with improvement of function. Pt has some use of both arms and can feed herself. She can walk a little bit with assistance. Pt accidentally fell at about 930 this morning after taking shower.  She injured her hips. Hospital Course:    Closed left hip fracture [facility]) s/p mechanical fall Closed right hip fracture (?old) Acute blood loss anemia --pt is s/p  INTRAMEDULLARY (IM) NAIL FEMORAL-left on 7/16  PAIN:  Are you having pain? NO: NPRS scale: 0/10 Pain location: Right hip Pain description: Ache Aggravating factors: any active Movement of right side Relieving factors: Rest  PRECAUTIONS: Fall  WEIGHT BEARING RESTRICTIONS Yes Bilateral non-weight bearing B LE's  FALLS: Has patient fallen in last 6 months? Yes. Number of falls 1  LIVING ENVIRONMENT: Lives with: lives with their family and lives in a nursing home Lives in: Other Long term care at [place]: No Has following equipment at home: Wheelchair (power), Ramped entry, and hoyer lift  PLOF: Needs assistance with ADLs, Needs assistance with homemaking, Needs assistance  with gait, Needs assistance with transfers, and Leisure: Work puzzles, read, Watch TV  PATIENT GOALS Patient goal is to walk; Be able to strengthen my arms for better  use  OBJECTIVE:   DIAGNOSTIC FINDINGS: Overall cognitive statusEXAM: DG HIP (WITH OR WITHOUT PELVIS) 2-3V LEFT; DG HIP (WITH OR WITHOUT PELVIS) 2-3V RIGHT   COMPARISON:  CT of the abdomen and pelvis 01/21/2020   FINDINGS: Left proximal femur fracture noted just below the intertrochanteric region. Marked ventral angulation noted on the cross-table image. Slight varus angulation present   Comminuted intratrochanteric fracture present on the right also with marked ventral angulation.   IMPRESSION: 1. Bilateral hip fractures. 2. Comminuted intertrochanteric fracture of the right hip. 3. Angulated fracture of the proximal left femur just below the intertrochanteric region.     Electronically Signed   By: San Morelle M.D.   On: 08/15/2021 11:54:   COGNITION:  Within functional limits for tasks assessed   SENSATION: Light touch: Impaired   EDEMA:  None  Strength R/L 2-/2- Shoulder flexion (anterior deltoid/pec major/coracobrachialis, axillary n. (C5-6) and musculocutaneous n. (C5-7)) 2-/3+ (within available ROM around 130 on lef)Shoulder abduction (deltoid/supraspinatus, axillary/suprascapular n, C5) 3+/3+ Shoulder external rotation (infraspinatus/teres minor) 4/4 Shoulder internal rotation (subcapularis/lats/pec major) 3+/3+ Shoulder extension (posterior deltoid, lats, teres major, axillary/thoracodorsal n.) 3+/3+ Shoulder horizontal abduction 4/4 Elbow flexion (biceps brachii, brachialis, brachioradialis, musculoskeletal n, C5-6) 4/4 Elbow extension (triceps, radial n, C7) 2-/2- Wrist Extension 3+/3+ Wrist Flexion   LOWER EXTREMITY MMT:    MMT Right Eval Left Eval  Hip flexion    Hip extension    Hip abduction    Hip adduction    Hip internal rotation    Hip external rotation    Knee  flexion    Knee extension    Ankle dorsiflexion    Ankle plantarflexion    Ankle inversion    Ankle eversion    (Blank rows = not tested) - BLE not tested secondary to weight bearing precaution of B LE NWB  BED MOBILITY:  Unable to test due to NWB status of BLE- Patient currently dependent with all bed mobility using hoyer and caregiver assist  TRANSFERS: Currently BLE NWB- Dependent on use of hoyer for all transfers  FUNCTIONAL TESTs:   PATIENT SURVEYS:  FOTO 12  TODAY'S TREATMENT:  12/02/2021  - COPIED FROM [facility]- DR. Jonny Ruiz Note from 11/18/2021:  11/18/2021 11/18/2021 Assessment and Plan:  Bilateral hip fractures.   Recommendation:  The patient's PT may start with range of motion and more activity with the hips. I would like to avoid weightbearing still. We will see her back here in 6 weeks for an exam and x-ray of both hips.    THEREX:  -Glute sets 1 x 10 reps 5 secs hold  -Quads sets 5 secs hold 2 sets x 10 reps each side -Hamstring sets 5 secs hold 1 x 10 reps each side -Hip abduction isometric with 5 sec hold 2 sets x 10 reps   -Hip adduction squeeze with red soft ball- hold 5 sec x 10 reps x 2 sets -Seated LAQ (AAROM On right) x 10 reps x 2 sets - Seated hip march (AAROM) x 10 reps x 2 sets  Reassessed goals for progress note #10.         PATIENT EDUCATION: Education details: PT plan of care/use of resistive bands Person educated: Patient Education method: Explanation, Demonstration, Tactile cues, and Verbal cues Education comprehension: verbalized understanding, returned demonstration, verbal cues required, tactile cues required, and needs further education   HOME EXERCISE PROGRAM: Discussed use of theraband and demo shoulder flex/abd and elbow flex and ext with use of yellow  TB. Patient required assist to position therband but able to perform well with only one person assist  Access Code: [facility] URL: [email or web address]  Date: 11/23/2021 Prepared by: Janna Arch Exercises - Seated Gluteal Sets - 2 x daily - 7 x weekly - 2 sets - 10 reps - 5 hold - Seated Quad Set - 2 x daily - 7 x weekly - 2 sets - 10 reps - 5 hold - Seated Long Arc Quad - 2 x daily - 7 x weekly - 2 sets - 10 reps - 5 hold - Seated March - 2 x daily - 7 x weekly - 2 sets - 10 reps - 5 hold - Seated Hip Abduction - 2 x daily - 7 x weekly - 2 sets - 10 reps - 5 hold - Seated Shoulder Shrugs - 2 x daily - 7 x weekly - 2 sets - 10 reps - 5 hold - Wheelchair Pressure Relief - 2 x daily - 7 x weekly - 2 sets - 10 reps - 5 hold  GOALS: Goals reviewed with patient? Yes  SHORT TERM GOALS: Target date: 01/13/2022  Pt will be independent with initial UE strengthening HEP in order to improve strength and balance in order to decrease fall risk and improve function at home and work. Baseline: 10/19/2021- patient with no formal UE HEP; 12/02/2021= Patient verbalized knowledge of HEP including use of theraband for UE strengthening.  Goal status: GOAL MET  LONG TERM GOALS: Target date: 02/24/2022  Pt will be independent with final for UE/LE HEP in order to improve strength and balance in order to decrease fall risk and improve function at home and work. Baseline: Patient is currently BLE NWB and unable to participate in HEP. Has order for UE strengthening. Goal status: INITIAL  2.  Pt will improve FOTO to target score of 35  to display perceived improvements in ability to complete ADL's.  Baseline: 10/19/2021= 12; 12/02/2021= 17 Goal status: PROGRESSING  3.  Pt will increase strength of B UE  by at least 1/2 MMT grade in order to demonstrate improvement in strength and function  Baseline: patient has range of 2-/5 to 4/5 BUE Strength; 12/02/2021= 4/5 except with wrist ext.  Goal status: PROGRESSING  ASSESSMENT:  CLINICAL IMPRESSION: Patient presented with good motivation for today's session. Patient was able to use Right LE and achieve more ROM with knee ext  and hip march today. She presented with more UE strength and satisfied the goal indicating improved overall strength. Patient also presented with slightly improved FOTO score indicating improved self perceived functional ability. Patient's condition has the potential to improve in response to therapy. Maximum improvement is yet to be obtained. The anticipated improvement is attainable and reasonable in a generally predictable time.  Pt will continue to benefit from skilled PT services to improve strength in anticipation for BLE clearance for [place] activities.   OBJECTIVE IMPAIRMENTS Abnormal gait, decreased activity tolerance, decreased balance, decreased coordination, decreased endurance, decreased mobility, difficulty walking, decreased ROM, decreased strength, hypomobility, impaired flexibility, impaired UE functional use, postural dysfunction, and pain.   ACTIVITY LIMITATIONS carrying, lifting, bending, sitting, standing, squatting, sleeping, stairs, transfers, bed mobility, continence, bathing, toileting, dressing, self feeding, reach over head, hygiene/grooming, and caring for others  PARTICIPATION LIMITATIONS: meal prep, cleaning, laundry, medication management, personal finances, interpersonal relationship, driving, shopping, community activity, and yard work  PERSONAL FACTORS Age, Time since onset of injury/illness/exacerbation, and 1-2 comorbidities: HTN, cervical Sx  are also affecting patient's functional outcome.  REHAB POTENTIAL: Good  CLINICAL DECISION MAKING: Evolving/moderate complexity  EVALUATION COMPLEXITY: Moderate  PLAN: PT FREQUENCY: 2x/week  PT DURATION: 12 weeks  PLANNED INTERVENTIONS: Therapeutic exercises, Therapeutic activity, Neuromuscular re-education, Balance training, Gait training, Patient/Family education, Self Care, Joint mobilization, DME instructions, Dry Needling, Electrical stimulation, Wheelchair mobility training, Spinal mobilization, Cryotherapy, Moist  heat, and Manual therapy  PLAN FOR NEXT SESSION: Instruct in home program for UE strengthening  Ollen Bowl, PT 1:02 PM,12/02/21  Physical Therapist- [facility]  12/02/2021, 1:02 PM

## 2021-12-07 ENCOUNTER — Ambulatory Visit: Payer: Medicare HMO

## 2021-12-07 ENCOUNTER — Ambulatory Visit: Payer: Medicare HMO | Admitting: Occupational Therapy

## 2021-12-07 DIAGNOSIS — M6281 Muscle weakness (generalized): Secondary | ICD-10-CM | POA: Diagnosis not present

## 2021-12-07 DIAGNOSIS — S14129S Central cord syndrome at unspecified level of cervical spinal cord, sequela: Secondary | ICD-10-CM

## 2021-12-07 DIAGNOSIS — R269 Unspecified abnormalities of gait and mobility: Secondary | ICD-10-CM

## 2021-12-07 DIAGNOSIS — R2689 Other abnormalities of gait and mobility: Secondary | ICD-10-CM

## 2021-12-07 DIAGNOSIS — R140 Abdominal distension (gaseous): Secondary | ICD-10-CM

## 2021-12-07 DIAGNOSIS — R278 Other lack of coordination: Secondary | ICD-10-CM

## 2021-12-07 DIAGNOSIS — S72001A Fracture of unspecified part of neck of right femur, initial encounter for closed fracture: Secondary | ICD-10-CM

## 2021-12-07 DIAGNOSIS — R262 Difficulty in walking, not elsewhere classified: Secondary | ICD-10-CM

## 2021-12-07 DIAGNOSIS — R2681 Unsteadiness on feet: Secondary | ICD-10-CM

## 2021-12-07 DIAGNOSIS — O2230 Deep phlebothrombosis in pregnancy, unspecified trimester: Secondary | ICD-10-CM

## 2021-12-07 NOTE — Therapy (Addendum)
Occupational Therapy Treatment Note    Patient Name: Sherry [name] MRN: [id number] DOB:09-12-1934, 87 y.o., female Today's Date: 12/07/2021                                                                                                                                                         PCP: Dr. Viviana Simpler REFERRING PROVIDER: Dr. Viviana Simpler   OT End of Session - 12/07/21 1300     Visit Number 18    Number of Visits 24    Date for OT Re-Evaluation 12/07/21    Authorization Time Period Progress report period starting 09/14/2021    OT Start Time 60    OT Stop Time 1145    OT Time Calculation (min) 45 min    Equipment Utilized During Treatment moist heat    Activity Tolerance Patient tolerated treatment well    Behavior During Therapy WFL for tasks assessed/performed             Past Medical History:  Diagnosis Date   Acute blood loss anemia    Arthritis    Cancer ([place])    skin   Central cord syndrome at C6 level of cervical spinal cord ([place]) 11/29/2017   Hypertension    Protein-calorie malnutrition, severe ([place]) 01/24/2018   S/P insertion of IVC (inferior vena caval) filter 01/24/2018   Tetraparesis ([place])    Past Surgical History:  Procedure Laterality Date   ANTERIOR CERVICAL DECOMP/DISCECTOMY FUSION N/A 11/29/2017   Procedure: Cervical five-six, six-seven Anterior Cervical Decompression Fusion;  Surgeon: Judith Part, MD;  Location: [place];  Service: Neurosurgery;  Laterality: N/A;  Cervical five-six, six-seven Anterior Cervical Decompression Fusion   CATARACT EXTRACTION     FEMUR IM NAIL Left 08/16/2021   Procedure: INTRAMEDULLARY (IM) NAIL FEMORAL;  Surgeon: Earnestine Leys, MD;  Location: [facility];  Service: Orthopedics;  Laterality: Left;   IR IVC FILTER PLMT / S&I /IMG GUID/MOD SED  12/08/2017   Patient Active Problem List   Diagnosis Date Noted   Closed hip fracture ([place]) 08/15/2021   DVT (deep venous thrombosis) ([place]) 08/15/2021    Quadriplegia ([place]) 08/15/2021   Fall 08/15/2021   Acute appendicitis with appendiceal abscess 05/30/2018   Hypocalcemia 02/15/2018   Dysuria 02/15/2018   Bilateral lower extremity edema 02/11/2018   Vaginal yeast infection 01/30/2018   Chest tightness 01/27/2018   Healthcare-associated pneumonia 01/25/2018   Pleural effusion, not elsewhere classified 01/25/2018   Acute deep vein thrombosis (DVT) of left lower extremity ([place]) 01/24/2018   Acute deep vein thrombosis (DVT) of right lower extremity ([place]) 01/24/2018   Chronic allergic rhinitis 01/24/2018   Depression with anxiety 01/24/2018   UTI due to Klebsiella species 01/24/2018   Protein-calorie malnutrition, severe ([place]) 01/24/2018   S/P insertion of IVC (inferior vena caval) filter 01/24/2018  Reactive depression    Hypokalemia    Leukocytosis    Essential hypertension    Trauma    Tetraparesis (HCC)    Neuropathic pain    Neurogenic bowel    Neurogenic bladder    Benign essential HTN    Acute postoperative anemia due to expected blood loss    Central cord syndrome at C6 level of cervical spinal cord ([place]) 11/29/2017   Central cord syndrome ([place]) 11/29/2017   SCC (squamous cell carcinoma) 04/08/2014      REFERRING DIAG: Central Cord Syndrome at C6 of the Cervical Spinal Cord, Fall with Bilateral Closed Hip Fractures, with ORIF repair with Intramedullary Nailing of the Left Hip Fracture.  THERAPY DIAG:  Muscle weakness (generalized)  Rationale for Evaluation and Treatment Rehabilitation  PERTINENT HISTORY: Patient s/p fall November 29, 2017 resulting in diagnosis of central cord syndrome at C 6 level.  She has had therapy in multiple venues but with recent move to [facility], therapy staff recommended she seek outpatient therapy for her needs. Pt. Had a recent fall with Bilateral Closed Hip Fractures, with ORIF repair with Intramedullary Nailing of the Left Hip Fracture.   PRECAUTIONS: None  SUBJECTIVE:   Pt. Reports   doing well today.  PAIN:  Are you having pain? No     OBJECTIVE:   TODAY'S TREATMENT:    Manual Therapy:   Pt. tolerated soft tissue massage to the volar, and dorsal surface of each digit on the bilateral hands secondary to stiffness. Manual therapy was performed independent of, and in preparation for therapeutic Ex.   Therapeutic Ex.    Pt. worked on [employer], and reciprocal motion using the UBE while seated for 8 min. with minimal resistance. Constant monitoring was provided. Pt. performed bilateral UE strengthening for bilateral shoulder flexion, chest presses, and elbow flexion, and extension. Pt. worked on bilateral UE strengthening Pt. tolerated PROM followed by AROM bilateral wrist extension, PROM for bilateral digit MP, PIP, and DIP flexion, and extension, thumb radial, and palmar abduction in conjunction with moist heat.    Pt. continues to tolerate manual therapy, and UE ther. ex well today without reports of pain following moist heat modality to the bilateral shoulders, and hands. Pt. Continues to be able to achieve increased passive digit flexion on the right. Pt. presents with tightness at the bilateral thumb webspace, and bilateral wrist flexor tightness, as well as digit MP, PIP, and DIP extensor tightness which continues to limit her ability to complete ADL tasks efficiently. Pt. continues to benefit from working on impoving ROM in order to work towards increasing bilateral hand grasp on objects, and increase engagement of her bilateral hands during ADLs, and IADL tasks.   PATIENT EDUCATION: Education details: BUE ROM, hand function, UE functioning Person educated: Patient Education method: Explanation and Demonstration Education comprehension: verbalized understanding, returned demonstration, and needs further education   HOME EXERCISE PROGRAM Continue with ongoing HEPs     OT Long Term Goals - 07/06/21 1106  Target Date: 12/07/2021    OT LONG TERM GOAL #1   Title Patient and caregiver will demonstrate understanding of home exercise program for ROM.    Baseline 10/28/2021: Pt. Is attempting exercises/stretches at home. The facility restorative aides have not resumed exercises. 09/14/2021:  Pt. Reports inconsistencies with restorative ROM at  her care home. Pt/caregiver education to be provided about ongoing ROM needs.    Time 12    Period Weeks    Status ongoing     OT LONG TERM GOAL #2   Title Pt.  Will improve BUE strength to be able to sustain her UE's in elevation to efficiently, and effectively perform hair care, and self-grooming tasks.   Baseline 10/28/2021: Right shoulder 3+/5, Left shoulder 4-/5 09/14/2021:  BUE shoulder strength: 3+/5 for shoulder flexion, and abduction.   Time 12    Period Weeks    Status Ongoing               OT LONG TERM GOAL #3   Title Patient will demonstrate improved composite finger flexion to hold deodorant to apply to underarms.    Baseline 10/28/2021:  Pt. Continues to have difficulty holding the deodorant. 01/12/2021: Pt. Presents with limited digit extension. Pt. Is able to initiate holding deodorant, however is unable to hold it while using it.   Time 12    Period Weeks    Status Ongoing   Target Date                  OT LONG TERM GOAL #4   Title Pt. Will improve bilateral wrist extension in preparation for anticipating, and initiating reaching for objects at the table.    Baseline 10/28/2021:  Right 22(38), Left 0(15) 09/14/2021:  Right: 17(35), left 2(15)   Time 12   Period weeks   Status Ongoing   Target Date      OT LONG TERM GOAL #5   Title Pt. will write, and sign her name with 100% legibility, and modified independence.    Baseline 10/28/2021: pt. is able to sign her name, however legibility continues to be inconsistent. Pt. Reports "shaky appearance". 09/14/2021: Pt. continues to fill out her daily menu  and complete puzzles. Pt. Continues to present with limited writing legibility.   Time 12    Period Weeks    Status  New          OT LONG TERM GOAL  #6   TITLE Pt. will turn pages in a book with modified independence.    Baseline 10/28/2021: Pt. Is able to consistently turn book pages. 09/14/2021: Pt. has difficulty turning pages.    Time 12    Period Weeks    Status Achieved                 Plan - 07/22/21 1737     Clinical Impression Statement Pt. continues to tolerate manual therapy, and UE ther. ex well today without reports of pain following moist heat modality to the bilateral shoulders, and hands. Pt. Continues to be able to achieve increased passive digit flexion on the right. Pt. presents with tightness at the bilateral thumb webspace, and bilateral wrist flexor tightness, as well as digit MP, PIP, and DIP extensor tightness which continues to limit her ability to complete ADL tasks efficiently. Pt. continues to benefit from working on impoving ROM in order to work towards increasing bilateral hand grasp on objects, and increase engagement of her bilateral hands during ADLs, and IADL tasks.  OT Occupational Profile and History Detailed Assessment- Review of Records and additional review of physical, cognitive, psychosocial history related to current functional performance    Occupational performance deficits (Please refer to evaluation for details): ADL's;IADL's;Leisure;Social Participation    Body Structure / Function / Physical Skills ADL;Continence;Dexterity;Flexibility;Strength;ROM;Balance;Coordination;FMC;IADL;Endurance;UE functional use;Decreased knowledge of use of DME;[facility]    Psychosocial Skills Coping Strategies    Rehab Potential Fair    Clinical Decision Making Several treatment options, min-mod task modification necessary    Comorbidities Affecting Occupational Performance: Presence of comorbidities impacting occupational performance     Comorbidities impacting occupational performance description: contractures of bilateral hands, dependent transfers,    Modification or Assistance to Complete Evaluation  No modification of tasks or assist necessary to complete eval    OT Frequency 2x / week    OT Duration 12 weeks    OT Treatment/Interventions Self-care/ADL training;Cryotherapy;Therapeutic exercise;DME and/or AE instruction;Balance training;Neuromuscular education;Manual Therapy;Splinting;Moist Heat;Passive range of motion;Therapeutic activities;Patient/family education    Plan continue to progress ROM of digits, wrists and shoulders as it pertains to completion of ADL tasks that pt values.    Consulted and Agree with Plan of Care Patient            Harrel Carina, MS, OTR/L  Harrel Carina, OT 12/07/2021, 1:02 PM

## 2021-12-07 NOTE — Therapy (Signed)
OUTPATIENT PHYSICAL THERAPY NEURO TREATMENT  Patient Name: Sherry [name] MRN: [id number] DOB:01-12-1935, 87 y.o., female Today's Date: 12/07/2021   PCP: Dewayne Shorter, MD REFERRING PROVIDER: Earnestine Leys   PT End of Session - 12/07/21 1210     Visit Number 11    Number of Visits 24    Date for PT Re-Evaluation 01/11/22    Authorization Time Period Initial cert [id number]/1275-17/00/1749    Progress Note Due on Visit 10    PT Start Time 1145    PT Stop Time 1216    PT Time Calculation (min) 31 min    Equipment Utilized During Treatment Gait belt    Activity Tolerance Patient tolerated treatment well    Behavior During Therapy WFL for tasks assessed/performed                   Past Medical History:  Diagnosis Date   Acute blood loss anemia    Arthritis    Cancer ([place])    skin   Central cord syndrome at C6 level of cervical spinal cord ([place]) 11/29/2017   Hypertension    Protein-calorie malnutrition, severe ([place]) 01/24/2018   S/P insertion of IVC (inferior vena caval) filter 01/24/2018   Tetraparesis ([place])    Past Surgical History:  Procedure Laterality Date   ANTERIOR CERVICAL DECOMP/DISCECTOMY FUSION N/A 11/29/2017   Procedure: Cervical five-six, six-seven Anterior Cervical Decompression Fusion;  Surgeon: Judith Part, MD;  Location: [place];  Service: Neurosurgery;  Laterality: N/A;  Cervical five-six, six-seven Anterior Cervical Decompression Fusion   CATARACT EXTRACTION     FEMUR IM NAIL Left 08/16/2021   Procedure: INTRAMEDULLARY (IM) NAIL FEMORAL;  Surgeon: Earnestine Leys, MD;  Location: [facility];  Service: Orthopedics;  Laterality: Left;   IR IVC FILTER PLMT / S&I /IMG GUID/MOD SED  12/08/2017   Patient Active Problem List   Diagnosis Date Noted   Closed hip fracture ([place]) 08/15/2021   DVT (deep venous thrombosis) ([place]) 08/15/2021   Quadriplegia ([place]) 08/15/2021   Fall 08/15/2021   Acute appendicitis with appendiceal abscess 05/30/2018    Hypocalcemia 02/15/2018   Dysuria 02/15/2018   Bilateral lower extremity edema 02/11/2018   Vaginal yeast infection 01/30/2018   Chest tightness 01/27/2018   Healthcare-associated pneumonia 01/25/2018   Pleural effusion, not elsewhere classified 01/25/2018   Acute deep vein thrombosis (DVT) of left lower extremity ([place]) 01/24/2018   Acute deep vein thrombosis (DVT) of right lower extremity ([place]) 01/24/2018   Chronic allergic rhinitis 01/24/2018   Depression with anxiety 01/24/2018   UTI due to Klebsiella species 01/24/2018   Protein-calorie malnutrition, severe ([place]) 01/24/2018   S/P insertion of IVC (inferior vena caval) filter 01/24/2018   Reactive depression    Hypokalemia    Leukocytosis    Essential hypertension    Trauma    Tetraparesis (HCC)    Neuropathic pain    Neurogenic bowel    Neurogenic bladder    Benign essential HTN    Acute postoperative anemia due to expected blood loss    Central cord syndrome at C6 level of cervical spinal cord ([place]) 11/29/2017   Central cord syndrome ([place]) 11/29/2017   SCC (squamous cell carcinoma) 04/08/2014    ONSET DATE: 08/15/2021 (fall with B hip fx); Initial injury was in 2019- quadraplegia due to central cord syndrom of C6 secondary to neck fracture.   REFERRING DIAG: Bilateral Hip fx; Left Open- s/p Left IM nail femoral on 08/16/2021; Right closed  THERAPY DIAG:  Muscle weakness (generalized)  Other lack of coordination  Abnormality of gait and mobility  Difficulty in walking, not elsewhere classified  Other abnormalities of gait and mobility  Closed fracture of both hips, initial encounter (HCC)  Unsteadiness on feet  Central cord syndrome, sequela (HCC)  Abdominal distention  DVT (deep vein thrombosis) in pregnancy  Rationale for Evaluation and Treatment Rehabilitation  SUBJECTIVE:                                                                                                                                                                                               SUBJECTIVE STATEMENT:       Pt accompanied by:  Paid caregiver-   PERTINENT HISTORY: Patient was previous patient (see previous episode for specifics) She experienced a fall at home on 08/15/2021 and was in [facility] from 08/15/2021- 08/20/2021.  Most recent history per Dr. Laurey Arrow on 08/20/2021.  Sherry [name] is a 87 y.o. 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   Patient has history of quadriplegia, and has been doing PT/OT with improvement of function. Pt has some use of both arms and can feed herself. She can walk a little bit with assistance. Pt accidentally fell at about 930 this morning after taking shower.  She injured her hips. Hospital Course:    Closed left hip fracture [facility]) s/p mechanical fall Closed right hip fracture (?old) Acute blood loss anemia --pt is s/p  INTRAMEDULLARY (IM) NAIL FEMORAL-left on 7/16  PAIN:  Are you having pain? NO: NPRS scale: 0/10 Pain location: Right hip Pain description: Ache Aggravating factors: any active Movement of right side Relieving factors: Rest  PRECAUTIONS: Fall  WEIGHT BEARING RESTRICTIONS Yes Bilateral non-weight bearing B LE's  FALLS: Has patient fallen in last 6 months? Yes. Number of falls 1  LIVING ENVIRONMENT: Lives with: lives with their family and lives in a nursing home Lives in: Other Long term care at [place]: No Has following equipment at home: Wheelchair (power), Ramped entry, and hoyer lift  PLOF: Needs assistance with ADLs, Needs assistance with homemaking, Needs assistance with gait, Needs assistance with transfers, and Leisure: Work puzzles, read, Watch TV  PATIENT GOALS Patient goal is to walk; Be able to  strengthen my arms for better use  OBJECTIVE:   DIAGNOSTIC FINDINGS: Overall cognitive statusEXAM: DG HIP (WITH OR WITHOUT PELVIS) 2-3V LEFT; DG HIP (WITH OR WITHOUT PELVIS) 2-3V RIGHT   COMPARISON:  CT of the abdomen and pelvis 01/21/2020   FINDINGS: Left proximal femur fracture noted just below the intertrochanteric region. Marked ventral angulation noted on the cross-table image. Slight varus angulation present   Comminuted intratrochanteric fracture present on the right also with marked ventral angulation.   IMPRESSION: 1. Bilateral hip fractures. 2. Comminuted intertrochanteric fracture of the right hip. 3. Angulated fracture of the proximal left femur just below the intertrochanteric region.     Electronically Signed   By: San Morelle M.D.   On: 08/15/2021 11:54:   COGNITION:  Within functional limits for tasks assessed   SENSATION: Light touch: Impaired   EDEMA:  None  Strength R/L 2-/2- Shoulder flexion (anterior deltoid/pec major/coracobrachialis, axillary n. (C5-6) and musculocutaneous n. (C5-7)) 2-/3+ (within available ROM around 130 on lef)Shoulder abduction (deltoid/supraspinatus, axillary/suprascapular n, C5) 3+/3+ Shoulder external rotation (infraspinatus/teres minor) 4/4 Shoulder internal rotation (subcapularis/lats/pec major) 3+/3+ Shoulder extension (posterior deltoid, lats, teres major, axillary/thoracodorsal n.) 3+/3+ Shoulder horizontal abduction 4/4 Elbow flexion (biceps brachii, brachialis, brachioradialis, musculoskeletal n, C5-6) 4/4 Elbow extension (triceps, radial n, C7) 2-/2- Wrist Extension 3+/3+ Wrist Flexion   LOWER EXTREMITY MMT:    MMT Right Eval Left Eval  Hip flexion    Hip extension    Hip abduction    Hip adduction    Hip internal rotation    Hip external rotation    Knee flexion    Knee extension    Ankle dorsiflexion    Ankle plantarflexion    Ankle inversion    Ankle eversion    (Blank rows = not  tested) - BLE not tested secondary to weight bearing precaution of B LE NWB  BED MOBILITY:  Unable to test due to NWB status of BLE- Patient currently dependent with all bed mobility using hoyer and caregiver assist  TRANSFERS: Currently BLE NWB- Dependent on use of hoyer for all transfers  FUNCTIONAL TESTs:   PATIENT SURVEYS:  FOTO 12  TODAY'S TREATMENT:  12/02/2021  - COPIED FROM [facility]- DR. Jonny Ruiz Note from 11/18/2021:  11/18/2021 11/18/2021 Assessment and Plan:  Bilateral hip fractures.   Recommendation:  The patient's PT may start with range of motion and more activity with the hips. I would like to avoid weightbearing still. We will see her back here in 6 weeks for an exam and x-ray of both hips.    THEREX:   -Quads sets 5 secs hold 2 sets x 10 reps each side -Hamstring AAROM 1 x 10 reps each side -Hip abduction isometric with 5 sec hold 1 set x 10 reps   -AAROM Hip abd sets x 10 reps -Hip adduction squeeze with red soft ball- hold 5 sec x 10 reps x 2 sets -Seated LAQ (AAROM On right) x 10 reps x 2 sets - Seated hip march (AAROM) x 10 reps x 2 sets        PATIENT EDUCATION: Education details: PT plan of care/use of resistive bands Person educated: Patient Education method: Explanation, Demonstration, Tactile cues, and Verbal cues Education comprehension: verbalized understanding, returned demonstration, verbal cues required, tactile cues required, and needs further education   HOME EXERCISE PROGRAM: Discussed use of theraband and demo shoulder flex/abd and elbow flex and ext with use of yellow TB. Patient required assist to position therband but able to perform well with only one person assist  Access Code: [facility] URL: [email or web address] Date: 11/23/2021 Prepared by: Janna Arch Exercises - Seated Gluteal Sets - 2 x daily - 7 x weekly -  2 sets - 10 reps - 5 hold - Seated Quad Set - 2 x daily - 7 x weekly - 2 sets - 10 reps - 5 hold  - Seated Long Arc Quad - 2 x daily - 7 x weekly - 2 sets - 10 reps - 5 hold - Seated March - 2 x daily - 7 x weekly - 2 sets - 10 reps - 5 hold - Seated Hip Abduction - 2 x daily - 7 x weekly - 2 sets - 10 reps - 5 hold - Seated Shoulder Shrugs - 2 x daily - 7 x weekly - 2 sets - 10 reps - 5 hold - Wheelchair Pressure Relief - 2 x daily - 7 x weekly - 2 sets - 10 reps - 5 hold  GOALS: Goals reviewed with patient? Yes  SHORT TERM GOALS: Target date: 01/18/2022  Pt will be independent with initial UE strengthening HEP in order to improve strength and balance in order to decrease fall risk and improve function at home and work. Baseline: 10/19/2021- patient with no formal UE HEP; 12/02/2021= Patient verbalized knowledge of HEP including use of theraband for UE strengthening.  Goal status: GOAL MET  LONG TERM GOALS: Target date: 03/01/2022  Pt will be independent with final for UE/LE HEP in order to improve strength and balance in order to decrease fall risk and improve function at home and work. Baseline: Patient is currently BLE NWB and unable to participate in HEP. Has order for UE strengthening. Goal status: INITIAL  2.  Pt will improve FOTO to target score of 35  to display perceived improvements in ability to complete ADL's.  Baseline: 10/19/2021= 12; 12/02/2021= 17 Goal status: PROGRESSING  3.  Pt will increase strength of B UE  by at least 1/2 MMT grade in order to demonstrate improvement in strength and function  Baseline: patient has range of 2-/5 to 4/5 BUE Strength; 12/02/2021= 4/5 except with wrist ext.  Goal status: PROGRESSING  ASSESSMENT:  CLINICAL IMPRESSION: Plan of care continues to focus on LE strengthening while remaining in NWB status. She continues to push herself to complete prescribed reps today and continues to use her Right LE more active. She also continues to deny any pain with today's activities.  Pt will continue to benefit from skilled PT services to improve strength  in anticipation for BLE clearance for [place] activities.   OBJECTIVE IMPAIRMENTS Abnormal gait, decreased activity tolerance, decreased balance, decreased coordination, decreased endurance, decreased mobility, difficulty walking, decreased ROM, decreased strength, hypomobility, impaired flexibility, impaired UE functional use, postural dysfunction, and pain.   ACTIVITY LIMITATIONS carrying, lifting, bending, sitting, standing, squatting, sleeping, stairs, transfers, bed mobility, continence, bathing, toileting, dressing, self feeding, reach over head, hygiene/grooming, and caring for others  PARTICIPATION LIMITATIONS: meal prep, cleaning, laundry, medication management, personal finances, interpersonal relationship, driving, shopping, community activity, and yard work  PERSONAL FACTORS Age, Time since onset of injury/illness/exacerbation, and 1-2 comorbidities: HTN, cervical Sx  are also affecting patient's functional outcome.   REHAB POTENTIAL: Good  CLINICAL DECISION MAKING: Evolving/moderate complexity  EVALUATION COMPLEXITY: Moderate  PLAN: PT FREQUENCY: 2x/week  PT DURATION: 12 weeks  PLANNED INTERVENTIONS: Therapeutic exercises, Therapeutic activity, Neuromuscular re-education, Balance training, Gait training, Patient/Family education, Self Care, Joint mobilization, DME instructions, Dry Needling, Electrical stimulation, Wheelchair mobility training, Spinal mobilization, Cryotherapy, Moist heat, and Manual therapy  PLAN FOR NEXT SESSION: Instruct in home program for UE strengthening  Ollen Bowl, PT 1:06 PM,12/07/21  Physical Therapist- [place]  [facility]  12/07/2021, 1:06 PM

## 2021-12-09 ENCOUNTER — Ambulatory Visit: Payer: Medicare HMO

## 2021-12-09 ENCOUNTER — Ambulatory Visit: Payer: Medicare HMO | Admitting: Occupational Therapy

## 2021-12-09 DIAGNOSIS — R269 Unspecified abnormalities of gait and mobility: Secondary | ICD-10-CM

## 2021-12-09 DIAGNOSIS — R2689 Other abnormalities of gait and mobility: Secondary | ICD-10-CM

## 2021-12-09 DIAGNOSIS — R2681 Unsteadiness on feet: Secondary | ICD-10-CM

## 2021-12-09 DIAGNOSIS — M6281 Muscle weakness (generalized): Secondary | ICD-10-CM

## 2021-12-09 DIAGNOSIS — R278 Other lack of coordination: Secondary | ICD-10-CM

## 2021-12-09 DIAGNOSIS — S72001A Fracture of unspecified part of neck of right femur, initial encounter for closed fracture: Secondary | ICD-10-CM

## 2021-12-09 DIAGNOSIS — R262 Difficulty in walking, not elsewhere classified: Secondary | ICD-10-CM

## 2021-12-09 NOTE — Therapy (Signed)
Occupational Therapy Treatment/Recertification Note    Patient Name: Sherry [name] MRN: [id number] DOB:1934-05-23, 87 y.o., female Today's Date: 12/09/2021                                         PCP: Dr. Viviana Simpler REFERRING PROVIDER: Dr. Viviana Simpler   OT End of Session - 12/09/21 1155     Visit Number 19    Number of Visits 24    Date for OT Re-Evaluation 03/03/22    Authorization Time Period Progress report period starting 09/14/2021             Past Medical History:  Diagnosis Date   Acute blood loss anemia    Arthritis    Cancer ([place])    skin   Central cord syndrome at C6 level of cervical spinal cord ([facility]) 11/29/2017   Hypertension    Protein-calorie malnutrition, severe ([place]) 01/24/2018   S/P insertion of IVC (inferior vena caval) filter 01/24/2018   Tetraparesis ([place])    Past Surgical History:  Procedure Laterality Date   ANTERIOR CERVICAL DECOMP/DISCECTOMY FUSION N/A 11/29/2017   Procedure: Cervical five-six, six-seven Anterior Cervical Decompression Fusion;  Surgeon: Judith Part, MD;  Location: [facility];  Service: Neurosurgery;  Laterality: N/A;  Cervical five-six, six-seven Anterior Cervical Decompression Fusion   CATARACT EXTRACTION     FEMUR IM NAIL Left 08/16/2021   Procedure: INTRAMEDULLARY (IM) NAIL FEMORAL;  Surgeon: Earnestine Leys, MD;  Location: [facility];  Service: Orthopedics;  Laterality: Left;   IR IVC FILTER PLMT / S&I /IMG GUID/MOD SED  12/08/2017   Patient Active Problem List   Diagnosis Date Noted   Closed hip fracture ([place]) 08/15/2021   DVT (deep venous thrombosis) ([place]) 08/15/2021   Quadriplegia ([place]) 08/15/2021   Fall 08/15/2021   Acute appendicitis with appendiceal abscess 05/30/2018   Hypocalcemia 02/15/2018   Dysuria 02/15/2018   Bilateral lower extremity edema 02/11/2018   Vaginal yeast infection 01/30/2018   Chest tightness 01/27/2018   Healthcare-associated pneumonia 01/25/2018   Pleural effusion, not elsewhere  classified 01/25/2018   Acute deep vein thrombosis (DVT) of left lower extremity ([place]) 01/24/2018   Acute deep vein thrombosis (DVT) of right lower extremity ([place]) 01/24/2018   Chronic allergic rhinitis 01/24/2018   Depression with anxiety 01/24/2018   UTI due to Klebsiella species 01/24/2018   Protein-calorie malnutrition, severe ([place]) 01/24/2018   S/P insertion of IVC (inferior vena caval) filter 01/24/2018   Reactive depression    Hypokalemia    Leukocytosis    Essential hypertension    Trauma    Tetraparesis ([facility])    Neuropathic pain    Neurogenic bowel    Neurogenic bladder    Benign essential HTN    Acute postoperative anemia due to expected blood loss    Central cord syndrome at C6 level of cervical spinal cord ([place]) 11/29/2017   Central cord syndrome ([place]) 11/29/2017   SCC (squamous cell carcinoma) 04/08/2014    REFERRING DIAG: Central Cord Syndrome at C6 of the Cervical Spinal Cord, Fall with Bilateral Closed Hip Fractures, with ORIF repair with Intramedullary Nailing of the Left Hip Fracture.  THERAPY DIAG:  Muscle weakness (generalized)  Rationale for Evaluation and Treatment Rehabilitation  PERTINENT HISTORY: Patient s/p fall November 29, 2017 resulting in diagnosis of central cord syndrome at C 6 level.  She has had therapy in multiple venues but with recent  move to [facility], therapy staff recommended she seek outpatient therapy for her needs. Pt. Had a recent fall with Bilateral Closed Hip Fractures, with ORIF repair with Intramedullary Nailing of the Left Hip Fracture.   PRECAUTIONS: None  SUBJECTIVE:   Pt. Reports  doing well today.  PAIN:  Are you having pain? No     OBJECTIVE:   TODAY'S TREATMENT:    Measurements were obtained, and goals were reviewed with the Pt. Pt. has made progress  over this past certification period since returning  to therapy following bilateral hip fractures,with left hip surgery. Pt. has progressed with AROM in right  shoulder flexion, and bilateral UE strength. Pt. is able to reach up in to her closet to access her jacket, and donn it over both arms in reverse position with the back facing the front. Pt. Is now able to brush her teeth with assist for set-up, Pt. Is independent with feeding herself more efficiently using standard utensils. Pt. Is able to water the cactus in her husband's room while holding and using a cup. Pt. Is able to hold a coffee cup, and drink from it. Pt. Reports writing more on her daily menus, however writing continues to appear "shaky". Pt. Continues to present with tightness at the bilateral thumb webspace, and bilateral wrist flexor tightness, as well as digit MP, PIP, and DIP extensor tightness which continues to limit her ability to complete ADL tasks efficiently. Pt. continues to benefit from working on impoving ROM in order to work towards increasing bilateral hand grasp on objects, and increase engagement of her bilateral hands during ADLs, and IADL tasks.   PATIENT EDUCATION: Education details: BUE ROM, hand function, UE functioning Person educated: Patient Education method: Explanation and Demonstration Education comprehension: verbalized understanding, returned demonstration, and needs further education   HOME EXERCISE PROGRAM Continue with ongoing HEPs  Measurements:  12/10/2022:  Shoulder flexion: R: 120(136), L: 138(150) Shoulder abduction: R: 108(120), L: 110(120) Elbow: R: 0-148, L: 0-146 Wrist flexion: R: 55(62), L: 78 Writ extension: R: 34(55), L: 3(4) RD: R: 12(20), L: 16(26) UD: R: 8(24), L: 6(18) Thumb radial abduction: R: 11(20), L: 8(20) Digit flexion to the [facility]: R:  2nd: 5cm(3.5cm), 3rd: 7.5 cm(5cm), 4th: 7cm(3cm), 5th: 6cm(5.5cm) L: 2nd: 8cm(8cm), 3rd: 8cm(8cm), 4th: 7.5cm(7cm), 5th: 7cm(7cm)            OT Long Term Goals - 07/06/21 1106                                                                                          Target Date:  03/03/2022    OT LONG TERM GOAL #1   Title Patient and caregiver will demonstrate understanding of home exercise program for ROM.    Baseline 12/09/2021: Pt. Reports the restorative ROM program has not resumed at the facility. 10/28/2021: Pt. Is attempting exercises/stretches at home. The facility restorative aides have not resumed exercises. 09/14/2021:  Pt. Reports inconsistencies with restorative ROM at  her care home. Pt/caregiver education to be provided about ongoing ROM needs.    Time 12    Period Weeks    Status Deferred  OT LONG TERM GOAL #2   Title Pt.  Will improve BUE strength to be able to sustain her UE's in elevation to efficiently, and effectively perform hair care, and self-grooming tasks.   Baseline 12/09/2021: right shoulder 4-/5, left shoulder 4/5. Pt. Has improved to  be able to reach up for hair care, and self-grooming tasks. 10/28/2021: Right shoulder 3+/5, Left shoulder 4-/5 09/14/2021:  BUE shoulder strength: 3+/5 for shoulder flexion, and abduction.   Time 12    Period Weeks    Status Ongoing               OT LONG TERM GOAL #3   Title Patient will demonstrate improved composite finger flexion to hold deodorant to apply to underarms.    Baseline 12/09/2021: Bilateral hand/digit MP, PIP, and DIP extension tightness limits her ability achieve digit flexion to hold, and apply deodorant. 10/28/2021:  Pt. Continues to have difficulty holding the deodorant. 01/12/2021: Pt. Presents with limited digit extension. Pt. Is able to initiate holding deodorant, however is unable to hold it while using it.   Time 12    Period Weeks    Status Ongoing   Target Date                  OT LONG TERM GOAL #4   Title Pt. Will improve bilateral wrist extension in preparation for anticipating, and initiating reaching for objects at the table.    Baseline 12/09/2021: Right  34(55)  Left  3(4) 10/28/2021:  Right 22(38), Left 0(15) 09/14/2021:  Right: 17(35), left 2(15)   Time 12   Period  weeks   Status Ongoing   Target Date      OT LONG TERM GOAL #5   Title Pt. will write, and sign her name with 100% legibility, and modified independence.    Baseline 12/09/2021: Pt. Reports legibility appears "shaky" when attempting signing her name. Pt.reports that she is writing more when filling out her menu. 10/28/2021: pt. is able to sign her name, however legibility continues to be inconsistent. Pt. Reports "shaky appearance". 09/14/2021: Pt. continues to fill out her daily menu and complete puzzles. Pt. Continues to present with limited writing legibility.   Time 12    Period Weeks    Status  New          OT LONG TERM GOAL  #6   TITLE Pt. will turn pages in a book with modified independence.    Baseline 10/28/2021: Pt. Is able to consistently turn book pages. 09/14/2021: Pt. has difficulty turning pages.    Time 12    Period Weeks    Status Achieved       OT LONG TERM GOAL  #7  TITLE Pt. Will increase  bilateral lateral pinch strength by 2 lbs to be able to securely grasp items during ADLs, and IADL tasks.   Baseline 12/09/2021:  Right: 6#, Left: 4#   Time 12  Period Weeks  Status New             Plan - 07/22/21 1737     Clinical Impression Statement Measurements were obtained, and goals were reviewed with the Pt. Pt. has made progress  over this past certification period since returning  to therapy following bilateral hip fractures,with left hip surgery. Pt. has progressed with AROM in right shoulder flexion, and bilateral UE strength. Pt. is able to reach up in to her closet to access her jacket, and donn it over both arms in reverse position  with the back facing the front. Pt. Is now able to brush her teeth with assist for set-up, Pt. Is independent with feeding herself more efficiently using standard utensils. Pt. Is able to water the cactus in her husband's room while holding and using a cup. Pt. Is able to hold a coffee cup, and drink from it. Pt. Reports writing more on  her daily menus, however writing continues to appear "shaky". Pt. Continues to present with tightness at the bilateral thumb webspace, and bilateral wrist flexor tightness, as well as digit MP, PIP, and DIP extensor tightness which continues to limit her ability to complete ADL tasks efficiently. Pt. continues to benefit from working on impoving ROM in order to work towards increasing bilateral hand grasp on objects, and increase engagement of her bilateral hands during ADLs, and IADL tasks.                          OT Occupational Profile and History Detailed Assessment- Review of Records and additional review of physical, cognitive, psychosocial history related to current functional performance    Occupational performance deficits (Please refer to evaluation for details): ADL's;IADL's;Leisure;Social Participation    Body Structure / Function / Physical Skills ADL;Continence;Dexterity;Flexibility;Strength;ROM;Balance;Coordination;FMC;IADL;Endurance;UE functional use;Decreased knowledge of use of DME;[facility]    Psychosocial Skills Coping Strategies    Rehab Potential Fair    Clinical Decision Making Several treatment options, min-mod task modification necessary    Comorbidities Affecting Occupational Performance: Presence of comorbidities impacting occupational performance    Comorbidities impacting occupational performance description: contractures of bilateral hands, dependent transfers,    Modification or Assistance to Complete Evaluation  No modification of tasks or assist necessary to complete eval    OT Frequency 2x / week    OT Duration 12 weeks    OT Treatment/Interventions Self-care/ADL training;Cryotherapy;Therapeutic exercise;DME and/or AE instruction;Balance training;Neuromuscular education;Manual Therapy;Splinting;Moist Heat;Passive range of motion;Therapeutic activities;Patient/family education    Plan continue to progress ROM of digits, wrists and shoulders as it pertains to  completion of ADL tasks that pt values.    Consulted and Agree with Plan of Care Patient            Harrel Carina, MS, OTR/L  Harrel Carina, OT 12/09/2021, 12:22 PM      `

## 2021-12-09 NOTE — Therapy (Signed)
OUTPATIENT PHYSICAL THERAPY NEURO TREATMENT  Patient Name: Sherry [name] MRN: [id number] DOB:1934-04-30, 87 y.o., female Today's Date: 12/09/2021   PCP: Dewayne Shorter, MD REFERRING PROVIDER: Earnestine Leys   PT End of Session - 12/09/21 1104     Visit Number 12    Number of Visits 24    Date for PT Re-Evaluation 01/11/22    Authorization Time Period Initial cert 0/25/8527-78/24/2353    Progress Note Due on Visit 10    PT Start Time 1100    PT Stop Time 1143    PT Time Calculation (min) 43 min    Equipment Utilized During Treatment Gait belt    Activity Tolerance Patient tolerated treatment well    Behavior During Therapy WFL for tasks assessed/performed                    Past Medical History:  Diagnosis Date   Acute blood loss anemia    Arthritis    Cancer ([place])    skin   Central cord syndrome at C6 level of cervical spinal cord ([place]) 11/29/2017   Hypertension    Protein-calorie malnutrition, severe ([place]) 01/24/2018   S/P insertion of IVC (inferior vena caval) filter 01/24/2018   Tetraparesis ([place])    Past Surgical History:  Procedure Laterality Date   ANTERIOR CERVICAL DECOMP/DISCECTOMY FUSION N/A 11/29/2017   Procedure: Cervical five-six, six-seven Anterior Cervical Decompression Fusion;  Surgeon: Judith Part, MD;  Location: [facility];  Service: Neurosurgery;  Laterality: N/A;  Cervical five-six, six-seven Anterior Cervical Decompression Fusion   CATARACT EXTRACTION     FEMUR IM NAIL Left 08/16/2021   Procedure: INTRAMEDULLARY (IM) NAIL FEMORAL;  Surgeon: Earnestine Leys, MD;  Location: [facility];  Service: Orthopedics;  Laterality: Left;   IR IVC FILTER PLMT / S&I /IMG GUID/MOD SED  12/08/2017   Patient Active Problem List   Diagnosis Date Noted   Closed hip fracture ([place]) 08/15/2021   DVT (deep venous thrombosis) ([place]) 08/15/2021   Quadriplegia ([place]) 08/15/2021   Fall 08/15/2021   Acute appendicitis with appendiceal abscess 05/30/2018    Hypocalcemia 02/15/2018   Dysuria 02/15/2018   Bilateral lower extremity edema 02/11/2018   Vaginal yeast infection 01/30/2018   Chest tightness 01/27/2018   Healthcare-associated pneumonia 01/25/2018   Pleural effusion, not elsewhere classified 01/25/2018   Acute deep vein thrombosis (DVT) of left lower extremity ([place]) 01/24/2018   Acute deep vein thrombosis (DVT) of right lower extremity ([place]) 01/24/2018   Chronic allergic rhinitis 01/24/2018   Depression with anxiety 01/24/2018   UTI due to Klebsiella species 01/24/2018   Protein-calorie malnutrition, severe ([place]) 01/24/2018   S/P insertion of IVC (inferior vena caval) filter 01/24/2018   Reactive depression    Hypokalemia    Leukocytosis    Essential hypertension    Trauma    Tetraparesis ([facility])    Neuropathic pain    Neurogenic bowel    Neurogenic bladder    Benign essential HTN    Acute postoperative anemia due to expected blood loss    Central cord syndrome at C6 level of cervical spinal cord ([place]) 11/29/2017   Central cord syndrome ([place]) 11/29/2017   SCC (squamous cell carcinoma) 04/08/2014    ONSET DATE: 08/15/2021 (fall with B hip fx); Initial injury was in 2019- quadraplegia due to central cord syndrom of C6 secondary to neck fracture.   REFERRING DIAG: Bilateral Hip fx; Left Open- s/p Left IM nail femoral on 08/16/2021; Right closed  THERAPY DIAG:  Muscle weakness (  generalized)  Other lack of coordination  Abnormality of gait and mobility  Difficulty in walking, not elsewhere classified  Other abnormalities of gait and mobility  Closed fracture of both hips, initial encounter ([place])  Unsteadiness on feet  Rationale for Evaluation and Treatment Rehabilitation  SUBJECTIVE:                                                                                                                                                                                              SUBJECTIVE STATEMENT:     Patient reports no  new issues- denies any particular soreness from last visit.   Pt accompanied by:  Paid caregiver-   PERTINENT HISTORY: Patient was previous patient (see previous episode for specifics) She experienced a fall at home on 08/15/2021 and was in [facility] from 08/15/2021- 08/20/2021.  Most recent history per Dr. Laurey Arrow on 08/20/2021.  Sherry [name] is a 87 y.o. 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   Patient has history of quadriplegia, and has been doing PT/OT with improvement of function. Pt has some use of both arms and can feed herself. She can walk a little bit with assistance. Pt accidentally fell at about 930 this morning after taking shower.  She injured her hips. Hospital Course:    Closed left hip fracture [facility]) s/p mechanical fall Closed right hip fracture (?old) Acute blood loss anemia --pt is s/p  INTRAMEDULLARY (IM) NAIL FEMORAL-left on 7/16  PAIN:  Are you having pain? NO: NPRS scale: 0/10 Pain location: Right hip Pain description: Ache Aggravating factors: any active Movement of right side Relieving factors: Rest  PRECAUTIONS: Fall  WEIGHT BEARING RESTRICTIONS Yes Bilateral non-weight bearing B LE's  FALLS: Has patient fallen in last 6 months? Yes. Number of falls 1  LIVING ENVIRONMENT: Lives with: lives with their family and lives in a nursing home Lives in: Other Long term care at [facility]: No Has following equipment at home: Wheelchair (power), Ramped entry, and hoyer lift  PLOF: Needs assistance with ADLs, Needs assistance with homemaking, Needs assistance with gait, Needs assistance with transfers, and Leisure: Work puzzles, read, Watch TV  PATIENT GOALS Patient goal is to walk; Be able to strengthen my arms for better  use  OBJECTIVE:   DIAGNOSTIC FINDINGS: Overall cognitive statusEXAM: DG HIP (WITH OR WITHOUT PELVIS) 2-3V LEFT; DG HIP (WITH OR WITHOUT PELVIS) 2-3V RIGHT   COMPARISON:  CT  of the abdomen and pelvis 01/21/2020   FINDINGS: Left proximal femur fracture noted just below the intertrochanteric region. Marked ventral angulation noted on the cross-table image. Slight varus angulation present   Comminuted intratrochanteric fracture present on the right also with marked ventral angulation.   IMPRESSION: 1. Bilateral hip fractures. 2. Comminuted intertrochanteric fracture of the right hip. 3. Angulated fracture of the proximal left femur just below the intertrochanteric region.     Electronically Signed   By: San Morelle M.D.   On: 08/15/2021 11:54:   COGNITION:  Within functional limits for tasks assessed   SENSATION: Light touch: Impaired   EDEMA:  None  Strength R/L 2-/2- Shoulder flexion (anterior deltoid/pec major/coracobrachialis, axillary n. (C5-6) and musculocutaneous n. (C5-7)) 2-/3+ (within available ROM around 130 on lef)Shoulder abduction (deltoid/supraspinatus, axillary/suprascapular n, C5) 3+/3+ Shoulder external rotation (infraspinatus/teres minor) 4/4 Shoulder internal rotation (subcapularis/lats/pec major) 3+/3+ Shoulder extension (posterior deltoid, lats, teres major, axillary/thoracodorsal n.) 3+/3+ Shoulder horizontal abduction 4/4 Elbow flexion (biceps brachii, brachialis, brachioradialis, musculoskeletal n, C5-6) 4/4 Elbow extension (triceps, radial n, C7) 2-/2- Wrist Extension 3+/3+ Wrist Flexion   LOWER EXTREMITY MMT:    MMT Right Eval Left Eval  Hip flexion    Hip extension    Hip abduction    Hip adduction    Hip internal rotation    Hip external rotation    Knee flexion    Knee extension    Ankle dorsiflexion    Ankle plantarflexion    Ankle inversion    Ankle eversion    (Blank rows = not tested) - BLE not tested  secondary to weight bearing precaution of B LE NWB  BED MOBILITY:  Unable to test due to NWB status of BLE- Patient currently dependent with all bed mobility using hoyer and caregiver assist  TRANSFERS: Currently BLE NWB- Dependent on use of hoyer for all transfers  FUNCTIONAL TESTs:   PATIENT SURVEYS:  FOTO 12  TODAY'S TREATMENT:  12/02/2021  - COPIED FROM [facility]- DR. Jonny Ruiz Note from 11/18/2021:  11/18/2021 11/18/2021 Assessment and Plan:  Bilateral hip fractures.   Recommendation:  The patient's PT may start with range of motion and more activity with the hips. I would like to avoid weightbearing still. We will see her back here in 6 weeks for an exam and x-ray of both hips.    THEREX:  -Gluteal sets with 5 sec hold x 10 reps -Hip adduction squeeze with pillow- hold 5 sec x 10 reps x 2 sets -Quads sets 5 secs hold- 2 sets x 10 reps each side -reclined with LE elevated in chair- heel slides-  AAROM 2 x 12 reps each side -Hip abduction -AAROM 2 sets of 12 reps each LE -Straight Leg raises- AAROM - 2 sets of 12 reps each LE -Seated LAQ (AAROM on right) x 10 reps x 2 sets - Seated hip march (AAROM) x 10 reps x 2 sets        PATIENT EDUCATION: Education details: PT plan of care/use of resistive bands Person educated: Patient Education method: Explanation, Demonstration, Tactile cues, and Verbal cues Education comprehension: verbalized understanding, returned demonstration, verbal cues required, tactile cues required, and needs further education   HOME EXERCISE PROGRAM: Discussed use of theraband and demo shoulder flex/abd and elbow flex and ext with use of yellow TB. Patient required assist to position therband but able to perform well with only one person assist  Access Code: [facility] URL: [email or web address] Date: 11/23/2021 Prepared by: Janna Arch  Exercises - Seated Gluteal Sets - 2 x daily - 7 x weekly - 2 sets - 10 reps - 5 hold - Seated  Quad Set - 2 x daily - 7 x weekly - 2 sets - 10 reps - 5 hold - Seated Long Arc Quad - 2 x daily - 7 x weekly - 2 sets - 10 reps - 5 hold - Seated March - 2 x daily - 7 x weekly - 2 sets - 10 reps - 5 hold - Seated Hip Abduction - 2 x daily - 7 x weekly - 2 sets - 10 reps - 5 hold - Seated Shoulder Shrugs - 2 x daily - 7 x weekly - 2 sets - 10 reps - 5 hold - Wheelchair Pressure Relief - 2 x daily - 7 x weekly - 2 sets - 10 reps - 5 hold  GOALS: Goals reviewed with patient? Yes  SHORT TERM GOALS: Target date: 01/20/2022  Pt will be independent with initial UE strengthening HEP in order to improve strength and balance in order to decrease fall risk and improve function at home and work. Baseline: 10/19/2021- patient with no formal UE HEP; 12/02/2021= Patient verbalized knowledge of HEP including use of theraband for UE strengthening.  Goal status: GOAL MET  LONG TERM GOALS: Target date: 03/03/2022  Pt will be independent with final for UE/LE HEP in order to improve strength and balance in order to decrease fall risk and improve function at home and work. Baseline: Patient is currently BLE NWB and unable to participate in HEP. Has order for UE strengthening. Goal status: INITIAL  2.  Pt will improve FOTO to target score of 35  to display perceived improvements in ability to complete ADL's.  Baseline: 10/19/2021= 12; 12/02/2021= 17 Goal status: PROGRESSING  3.  Pt will increase strength of B UE  by at least 1/2 MMT grade in order to demonstrate improvement in strength and function  Baseline: patient has range of 2-/5 to 4/5 BUE Strength; 12/02/2021= 4/5 except with wrist ext.  Goal status: PROGRESSING  ASSESSMENT:  CLINICAL IMPRESSION: Plan of care continues to focus on LE strengthening while remaining in NWB status. Patient able to respond to min VC for counting out loud for proper breathing with exercises. She required more assist with knee flex and hip flex vs. Knee ext or hip abd. She was able  to use some resistance with left LE LAQ today demonstrating progressive LE strength to prepare for future standing.   Pt will continue to benefit from skilled PT services to improve strength in anticipation for BLE clearance for [place] activities.   OBJECTIVE IMPAIRMENTS Abnormal gait, decreased activity tolerance, decreased balance, decreased coordination, decreased endurance, decreased mobility, difficulty walking, decreased ROM, decreased strength, hypomobility, impaired flexibility, impaired UE functional use, postural dysfunction, and pain.   ACTIVITY LIMITATIONS carrying, lifting, bending, sitting, standing, squatting, sleeping, stairs, transfers, bed mobility, continence, bathing, toileting, dressing, self feeding, reach over head, hygiene/grooming, and caring for others  PARTICIPATION LIMITATIONS: meal prep, cleaning, laundry, medication management, personal finances, interpersonal relationship, driving, shopping, community activity, and yard work  PERSONAL FACTORS Age, Time since onset of injury/illness/exacerbation, and 1-2 comorbidities: HTN, cervical Sx  are also affecting patient's functional outcome.   REHAB POTENTIAL: Good  CLINICAL DECISION MAKING: Evolving/moderate complexity  EVALUATION COMPLEXITY: Moderate  PLAN: PT FREQUENCY: 2x/week  PT DURATION: 12 weeks  PLANNED INTERVENTIONS: Therapeutic exercises, Therapeutic activity, Neuromuscular re-education, Balance training, Gait training, Patient/Family education, Self Care, Joint mobilization, DME instructions,  Dry Needling, Electrical stimulation, Wheelchair mobility training, Spinal mobilization, Cryotherapy, Moist heat, and Manual therapy  PLAN FOR NEXT SESSION: Instruct in home program for UE strengthening  Ollen Bowl, PT 12:45 PM,12/09/21  Physical Therapist- [facility]  12/09/2021, 12:45 PM

## 2021-12-14 ENCOUNTER — Encounter: Payer: Self-pay | Admitting: Student

## 2021-12-14 ENCOUNTER — Non-Acute Institutional Stay (SKILLED_NURSING_FACILITY): Payer: Medicare HMO | Admitting: Student

## 2021-12-14 ENCOUNTER — Ambulatory Visit: Payer: Medicare HMO | Admitting: Occupational Therapy

## 2021-12-14 ENCOUNTER — Ambulatory Visit: Payer: Medicare HMO

## 2021-12-14 DIAGNOSIS — K592 Neurogenic bowel, not elsewhere classified: Secondary | ICD-10-CM

## 2021-12-14 DIAGNOSIS — Z91018 Allergy to other foods: Secondary | ICD-10-CM

## 2021-12-14 DIAGNOSIS — I824Y3 Acute embolism and thrombosis of unspecified deep veins of proximal lower extremity, bilateral: Secondary | ICD-10-CM

## 2021-12-14 DIAGNOSIS — N319 Neuromuscular dysfunction of bladder, unspecified: Secondary | ICD-10-CM

## 2021-12-14 DIAGNOSIS — D62 Acute posthemorrhagic anemia: Secondary | ICD-10-CM

## 2021-12-14 DIAGNOSIS — I10 Essential (primary) hypertension: Secondary | ICD-10-CM

## 2021-12-14 DIAGNOSIS — M792 Neuralgia and neuritis, unspecified: Secondary | ICD-10-CM

## 2021-12-14 DIAGNOSIS — K5901 Slow transit constipation: Secondary | ICD-10-CM

## 2021-12-14 DIAGNOSIS — F329 Major depressive disorder, single episode, unspecified: Secondary | ICD-10-CM

## 2021-12-14 DIAGNOSIS — F418 Other specified anxiety disorders: Secondary | ICD-10-CM

## 2021-12-14 DIAGNOSIS — M6281 Muscle weakness (generalized): Secondary | ICD-10-CM

## 2021-12-14 DIAGNOSIS — G8250 Quadriplegia, unspecified: Secondary | ICD-10-CM

## 2021-12-14 DIAGNOSIS — J309 Allergic rhinitis, unspecified: Secondary | ICD-10-CM

## 2021-12-14 DIAGNOSIS — R262 Difficulty in walking, not elsewhere classified: Secondary | ICD-10-CM

## 2021-12-14 DIAGNOSIS — R278 Other lack of coordination: Secondary | ICD-10-CM

## 2021-12-14 DIAGNOSIS — S72009A Fracture of unspecified part of neck of unspecified femur, initial encounter for closed fracture: Secondary | ICD-10-CM | POA: Diagnosis not present

## 2021-12-14 DIAGNOSIS — E43 Unspecified severe protein-calorie malnutrition: Secondary | ICD-10-CM

## 2021-12-14 DIAGNOSIS — S14126S Central cord syndrome at C6 level of cervical spinal cord, sequela: Secondary | ICD-10-CM

## 2021-12-14 DIAGNOSIS — R269 Unspecified abnormalities of gait and mobility: Secondary | ICD-10-CM

## 2021-12-14 DIAGNOSIS — R2689 Other abnormalities of gait and mobility: Secondary | ICD-10-CM

## 2021-12-14 NOTE — Therapy (Signed)
OUTPATIENT PHYSICAL THERAPY NEURO TREATMENT  Patient Name: Sherry [name] MRN: [id number] DOB:Apr 25, 1934, 87 y.o., female Today's Date: 12/15/2021   PCP: Sherry Shorter, MD REFERRING PROVIDER: Earnestine [name]   PT End of Session - 12/14/21 1116     Visit Number 13    Number of Visits 24    Date for PT Re-Evaluation 01/11/22    Authorization Time Period Initial cert [id number]/02/[id number]    Progress Note Due on Visit 10    PT Start Time 1146    PT Stop Time 1220    PT Time Calculation (min) 34 min    Equipment Utilized During Treatment Gait belt    Activity Tolerance Patient tolerated treatment well    Behavior During Therapy WFL for tasks assessed/performed                    Past Medical History:  Diagnosis Date   Acute blood loss anemia    Arthritis    Cancer ([place])    skin   Central cord syndrome at C6 level of cervical spinal cord ([place]) 11/29/2017   Hypertension    Protein-calorie malnutrition, severe ([place]) 01/24/2018   S/P insertion of IVC (inferior vena caval) filter 01/24/2018   Tetraparesis ([place])    Past Surgical History:  Procedure Laterality Date   ANTERIOR CERVICAL DECOMP/DISCECTOMY FUSION N/A 11/29/2017   Procedure: Cervical five-six, six-seven Anterior Cervical Decompression Fusion;  Surgeon: Sherry Part, MD;  Location: [facility];  Service: Neurosurgery;  Laterality: N/A;  Cervical five-six, six-seven Anterior Cervical Decompression Fusion   CATARACT EXTRACTION     FEMUR IM NAIL Left 08/16/2021   Procedure: INTRAMEDULLARY (IM) NAIL FEMORAL;  Surgeon: Sherry Leys, MD;  Location: [facility];  Service: Orthopedics;  Laterality: Left;   IR IVC FILTER PLMT / S&I /IMG GUID/MOD SED  12/08/2017   Patient Active Problem List   Diagnosis Date Noted   Closed hip fracture ([place]) 08/15/2021   DVT (deep venous thrombosis) ([place]) 08/15/2021   Quadriplegia ([place]) 08/15/2021   Fall 08/15/2021   Acute appendicitis with appendiceal abscess 05/30/2018    Hypocalcemia 02/15/2018   Dysuria 02/15/2018   Bilateral lower extremity edema 02/11/2018   Vaginal yeast infection 01/30/2018   Chest tightness 01/27/2018   Healthcare-associated pneumonia 01/25/2018   Pleural effusion, not elsewhere classified 01/25/2018   Acute deep vein thrombosis (DVT) of left lower extremity ([place]) 01/24/2018   Acute deep vein thrombosis (DVT) of right lower extremity ([place]) 01/24/2018   Chronic allergic rhinitis 01/24/2018   Depression with anxiety 01/24/2018   UTI due to Klebsiella species 01/24/2018   Protein-calorie malnutrition, severe ([place]) 01/24/2018   S/P insertion of IVC (inferior vena caval) filter 01/24/2018   Reactive depression    Hypokalemia    Leukocytosis    Essential hypertension    Trauma    Tetraparesis ([facility])    Neuropathic pain    Neurogenic bowel    Neurogenic bladder    Benign essential HTN    Acute postoperative anemia due to expected blood loss    Central cord syndrome at C6 level of cervical spinal cord ([place]) 11/29/2017   Central cord syndrome ([place]) 11/29/2017   SCC (squamous cell carcinoma) 04/08/2014    ONSET DATE: 08/15/2021 (fall with B hip fx); Initial injury was in 2019- quadraplegia due to central cord syndrom of C6 secondary to neck fracture.   REFERRING DIAG: Bilateral Hip fx; Left Open- s/p Left IM nail femoral on 08/16/2021; Right closed  THERAPY DIAG:  Muscle weakness (  generalized)  Other lack of coordination  Abnormality of gait and mobility  Difficulty in walking, not elsewhere classified  Other abnormalities of gait and mobility  Rationale for Evaluation and Treatment Rehabilitation  SUBJECTIVE:                                                                                                                                                                                              SUBJECTIVE STATEMENT:     Patient reports doing well so far this week. States she was checked out by her new PCP and no  issues.   Pt accompanied by:  Sherry [name]-   PERTINENT HISTORY: Patient was previous patient (see previous episode for specifics) She experienced a fall at home on 08/15/2021 and was in [facility] from 08/15/2021- 08/20/2021.  Most recent history per Dr. Laurey [name] on 08/20/2021.  Sherry [name] is a 87 y.o. 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   Patient has history of quadriplegia, and has been doing PT/OT with improvement of function. Pt has some use of both arms and can feed herself. She can walk a little bit with assistance. Pt accidentally fell at about 930 this morning after taking shower.  She injured her hips. Hospital Course:    Closed left hip fracture [facility]) s/p mechanical fall Closed right hip fracture (?old) Acute blood loss anemia --pt is s/p  INTRAMEDULLARY (IM) NAIL FEMORAL-left on 7/16  PAIN:  Are you having pain? NO: NPRS scale: 0/10 Pain location: Right hip Pain description: Ache Aggravating factors: any active Movement of right side Relieving factors: Rest  PRECAUTIONS: Fall  WEIGHT BEARING RESTRICTIONS Yes Bilateral non-weight bearing B LE's  FALLS: Has patient fallen in last 6 months? Yes. Number of falls 1  LIVING ENVIRONMENT: Lives with: lives with their family and lives in a nursing home Lives in: Other Long term care at [place]: No Has following equipment at home: Wheelchair (power), Ramped entry, and hoyer lift  PLOF: Needs assistance with ADLs, Needs assistance with homemaking, Needs assistance with gait, Needs assistance with transfers, and Leisure: Work puzzles, read, Watch TV  PATIENT GOALS Patient goal is to walk; Be able to strengthen my arms for better use  OBJECTIVE:   DIAGNOSTIC FINDINGS: Overall  cognitive statusEXAM: DG HIP (WITH OR WITHOUT PELVIS) 2-3V LEFT; DG HIP (WITH OR WITHOUT PELVIS) 2-3V RIGHT   COMPARISON:  CT of the abdomen and pelvis  01/21/2020   FINDINGS: Left proximal femur fracture noted just below the intertrochanteric region. Marked ventral angulation noted on the cross-table image. Slight varus angulation present   Comminuted intratrochanteric fracture present on the right also with marked ventral angulation.   IMPRESSION: 1. Bilateral hip fractures. 2. Comminuted intertrochanteric fracture of the right hip. 3. Angulated fracture of the proximal left femur just below the intertrochanteric region.     Electronically Signed   By: San Morelle M.D.   On: 08/15/2021 11:54:   COGNITION:  Within functional limits for tasks assessed   SENSATION: Light touch: Impaired   EDEMA:  None  Strength R/L 2-/2- Shoulder flexion (anterior deltoid/pec major/coracobrachialis, axillary n. (C5-6) and musculocutaneous n. (C5-7)) 2-/3+ (within available ROM around 130 on lef)Shoulder abduction (deltoid/supraspinatus, axillary/suprascapular n, C5) 3+/3+ Shoulder external rotation (infraspinatus/teres minor) 4/4 Shoulder internal rotation (subcapularis/lats/pec major) 3+/3+ Shoulder extension (posterior deltoid, lats, teres major, axillary/thoracodorsal n.) 3+/3+ Shoulder horizontal abduction 4/4 Elbow flexion (biceps brachii, brachialis, brachioradialis, musculoskeletal n, C5-6) 4/4 Elbow extension (triceps, radial n, C7) 2-/2- Wrist Extension 3+/3+ Wrist Flexion   LOWER EXTREMITY MMT:    MMT Right Eval Left Eval  Hip flexion    Hip extension    Hip abduction    Hip adduction    Hip internal rotation    Hip external rotation    Knee flexion    Knee extension    Ankle dorsiflexion    Ankle plantarflexion    Ankle inversion    Ankle eversion    (Blank rows = not tested) - BLE not tested secondary to weight bearing precaution of B LE NWB  BED  MOBILITY:  Unable to test due to NWB status of BLE- Patient currently dependent with all bed mobility using hoyer and [name] assist  TRANSFERS: Currently BLE NWB- Dependent on use of hoyer for all transfers  FUNCTIONAL TESTs:   PATIENT SURVEYS:  FOTO 12  TODAY'S TREATMENT:  12/14/2021  - COPIED FROM [facility]- DR. Jonny Ruiz Note from 11/18/2021:  11/18/2021 11/18/2021 Assessment and Plan:  Bilateral hip fractures.   Recommendation:  The patient's PT may start with range of motion and more activity with the hips. I would like to avoid weightbearing still. We will see her back here in 6 weeks for an exam and x-ray of both hips.    THEREX: In power w/c (due to B NWB LE status)   -Gluteal sets with 5 sec hold x 15 reps -Hip adduction squeeze with pillow- hold 5 sec x 15 reps  -Quads sets 5 secs hold- 2 sets x 10 reps each side -reclined with LE elevated in chair- heel slides-  AAROM 2 x 12 reps each side -Hip abduction -AAROM 2 sets of 12 reps each LE -Straight Leg raises- AAROM - 2 sets of 12 reps each LE -Seated LAQ (AAROM on right) and resistive on left (2.5#) x 10 reps x 2 sets - Seated hip march (AAROM) x 10 reps x 2 sets        PATIENT EDUCATION: Education details: PT plan of care/use of resistive bands Person educated: Patient Education method: Explanation, Demonstration, Tactile cues, and Verbal cues Education comprehension: verbalized understanding, returned demonstration, verbal cues required, tactile cues required, and needs further education   HOME EXERCISE PROGRAM: Discussed use of theraband and demo shoulder flex/abd and elbow flex and ext with use of yellow TB. Patient required assist to position therband but able to perform well with only one person assist  Access Code: [facility]  URL: [email or web address] Date: 11/23/2021 Prepared by: Janna Arch Exercises - Seated Gluteal Sets - 2 x daily - 7 x weekly - 2 sets - 10 reps - 5 hold -  Seated Quad Set - 2 x daily - 7 x weekly - 2 sets - 10 reps - 5 hold - Seated Long Arc Quad - 2 x daily - 7 x weekly - 2 sets - 10 reps - 5 hold - Seated March - 2 x daily - 7 x weekly - 2 sets - 10 reps - 5 hold - Seated Hip Abduction - 2 x daily - 7 x weekly - 2 sets - 10 reps - 5 hold - Seated Shoulder Shrugs - 2 x daily - 7 x weekly - 2 sets - 10 reps - 5 hold - Wheelchair Pressure Relief - 2 x daily - 7 x weekly - 2 sets - 10 reps - 5 hold  GOALS: Goals reviewed with patient? Yes  SHORT TERM GOALS: Target date: 01/26/2022  Pt will be independent with initial UE strengthening HEP in order to improve strength and balance in order to decrease fall risk and improve function at home and work. Baseline: 10/19/2021- patient with no formal UE HEP; 12/02/2021= Patient verbalized knowledge of HEP including use of theraband for UE strengthening.  Goal status: GOAL MET  LONG TERM GOALS: Target date: 03/09/2022  Pt will be independent with final for UE/LE HEP in order to improve strength and balance in order to decrease fall risk and improve function at home and work. Baseline: Patient is currently BLE NWB and unable to participate in HEP. Has order for UE strengthening. Goal status: INITIAL  2.  Pt will improve FOTO to target score of 35  to display perceived improvements in ability to complete ADL's.  Baseline: 10/19/2021= 12; 12/02/2021= 17 Goal status: PROGRESSING  3.  Pt will increase strength of B UE  by at least 1/2 MMT grade in order to demonstrate improvement in strength and function  Baseline: patient has range of 2-/5 to 4/5 BUE Strength; 12/02/2021= 4/5 except with wrist ext.  Goal status: PROGRESSING  ASSESSMENT:  CLINICAL IMPRESSION: Plan of care continues to focus on LE strengthening while remaining in NWB status. Patient initially presented with increased right LE weakness including minimal knee ext but did improve during session. She was able to exhibit improving left LE strength as  seen by increased ankle weight resistance today.  Pt will continue to benefit from skilled PT services to improve strength in anticipation for BLE clearance for [facility] activities.   OBJECTIVE IMPAIRMENTS Abnormal gait, decreased activity tolerance, decreased balance, decreased coordination, decreased endurance, decreased mobility, difficulty walking, decreased ROM, decreased strength, hypomobility, impaired flexibility, impaired UE functional use, postural dysfunction, and pain.   ACTIVITY LIMITATIONS carrying, lifting, bending, sitting, standing, squatting, sleeping, stairs, transfers, bed mobility, continence, bathing, toileting, dressing, self feeding, reach over head, hygiene/grooming, and caring for others  PARTICIPATION LIMITATIONS: meal prep, cleaning, laundry, medication management, personal finances, interpersonal relationship, driving, shopping, community activity, and yard work  PERSONAL FACTORS Age, Time since onset of injury/illness/exacerbation, and 1-2 comorbidities: HTN, cervical Sx  are also affecting patient's functional outcome.   REHAB POTENTIAL: Good  CLINICAL DECISION MAKING: Evolving/moderate complexity  EVALUATION COMPLEXITY: Moderate  PLAN: PT FREQUENCY: 2x/week  PT DURATION: 12 weeks  PLANNED INTERVENTIONS: Therapeutic exercises, Therapeutic activity, Neuromuscular re-education, Balance training, Gait training, Patient/Family education, Self Care, Joint mobilization, DME instructions, Dry Needling, Electrical stimulation, Wheelchair mobility training, Spinal mobilization, Cryotherapy, Moist heat,  and Manual therapy  PLAN FOR NEXT SESSION: Instruct in home program for UE strengthening  Ollen Bowl, PT 7:45 AM,12/15/21  Physical Therapist- [facility]  12/15/2021, 7:45 AM

## 2021-12-14 NOTE — Therapy (Signed)
Occupational Therapy Progress Note  Dates of reporting period  09/14/2021   to   12/14/2021     Patient Name: Sherry [name] MRN: [id number] DOB:08/30/1934, 87 y.o., female Today's Date: 12/14/2021                                         PCP: Dr. Viviana Simpler REFERRING PROVIDER: Dr. Viviana Simpler   OT End of Session - 12/14/21 1336     Visit Number 20    Number of Visits 24    Date for OT Re-Evaluation 03/03/22    Authorization Time Period Progress report period starting 09/14/2021    OT Start Time 1110    OT Stop Time 1145    OT Time Calculation (min) 35 min    Activity Tolerance Patient tolerated treatment well    Behavior During Therapy [facility] for tasks assessed/performed               Past Medical History:  Diagnosis Date   Acute blood loss anemia    Arthritis    Cancer ([place])    skin   Central cord syndrome at C6 level of cervical spinal cord ([place]) 11/29/2017   Hypertension    Protein-calorie malnutrition, severe ([place]) 01/24/2018   S/P insertion of IVC (inferior vena caval) filter 01/24/2018   Tetraparesis ([place])    Past Surgical History:  Procedure Laterality Date   ANTERIOR CERVICAL DECOMP/DISCECTOMY FUSION N/A 11/29/2017   Procedure: Cervical five-six, six-seven Anterior Cervical Decompression Fusion;  Surgeon: Judith Part, MD;  Location: [facility];  Service: Neurosurgery;  Laterality: N/A;  Cervical five-six, six-seven Anterior Cervical Decompression Fusion   CATARACT EXTRACTION     FEMUR IM NAIL Left 08/16/2021   Procedure: INTRAMEDULLARY (IM) NAIL FEMORAL;  Surgeon: Earnestine Leys, MD;  Location: [facility];  Service: Orthopedics;  Laterality: Left;   IR IVC FILTER PLMT / S&I /IMG GUID/MOD SED  12/08/2017   Patient Active Problem List   Diagnosis Date Noted   Closed hip fracture ([place]) 08/15/2021   DVT (deep venous thrombosis) ([place]) 08/15/2021   Quadriplegia ([place]) 08/15/2021   Fall 08/15/2021   Acute appendicitis with appendiceal abscess 05/30/2018    Hypocalcemia 02/15/2018   Dysuria 02/15/2018   Bilateral lower extremity edema 02/11/2018   Vaginal yeast infection 01/30/2018   Chest tightness 01/27/2018   Healthcare-associated pneumonia 01/25/2018   Pleural effusion, not elsewhere classified 01/25/2018   Acute deep vein thrombosis (DVT) of left lower extremity ([place]) 01/24/2018   Acute deep vein thrombosis (DVT) of right lower extremity ([place]) 01/24/2018   Chronic allergic rhinitis 01/24/2018   Depression with anxiety 01/24/2018   UTI due to Klebsiella species 01/24/2018   Protein-calorie malnutrition, severe ([place]) 01/24/2018   S/P insertion of IVC (inferior vena caval) filter 01/24/2018   Reactive depression    Hypokalemia    Leukocytosis    Essential hypertension    Trauma    Tetraparesis ([facility])    Neuropathic pain    Neurogenic bowel    Neurogenic bladder    Benign essential HTN    Acute postoperative anemia due to expected blood loss    Central cord syndrome at C6 level of cervical spinal cord ([place]) 11/29/2017   Central cord syndrome ([place]) 11/29/2017   SCC (squamous cell carcinoma) 04/08/2014    REFERRING DIAG: Central Cord Syndrome at C6 of the Cervical Spinal Cord, Fall with Bilateral Closed  Hip Fractures, with ORIF repair with Intramedullary Nailing of the Left Hip Fracture.  THERAPY DIAG:  Muscle weakness (generalized)  Rationale for Evaluation and Treatment Rehabilitation  PERTINENT HISTORY: Patient s/p fall November 29, 2017 resulting in diagnosis of central cord syndrome at C 6 level.  She has had therapy in multiple venues but with recent move to [facility], therapy staff recommended she seek outpatient therapy for her needs. Pt. Had a recent fall with Bilateral Closed Hip Fractures, with ORIF repair with Intramedullary Nailing of the Left Hip Fracture.   PRECAUTIONS: None  SUBJECTIVE:   Pt. Reports  doing well today.  PAIN:  Are you having pain? No     OBJECTIVE:   TODAY'S TREATMENT:   Manual  Therapy:   Pt. tolerated soft tissue massage to the volar, and dorsal surface of each digit on the bilateral hands secondary to stiffness. Manual therapy was performed independent of, and in preparation for therapeutic Ex.   Therapeutic Ex.    Pt. worked on [employer], and reciprocal motion using the UBE while seated for 8 min. with minimal resistance. Constant monitoring was provided. Pt. performed bilateral UE strengthening for bilateral shoulder flexion, chest presses, and elbow flexion, and extension. Pt. worked on bilateral UE strengthening Pt. tolerated PROM followed by AROM bilateral wrist extension, PROM for bilateral digit MP, PIP, and DIP flexion, and extension, thumb radial, and palmar abduction in conjunction with moist heat.      Pt.'s recertification progress update was completed the last session. Goals were reviewed, and updated. Goals will remain the same for this 10th visit progress update. Pt. has made progress over this past progress reporting period since returning  to therapy following bilateral hip fractures,with left hip surgery. Pt. Has progressed with AROM in right shoulder flexion, and bilateral UE strength. Pt. is able to reach up in to her closet to access her jacket, and donn it over both arms in reverse position with the back facing the front. Pt. Continues to be able to brush her teeth with assist for set-up, Pt. Is independent with feeding herself more efficiently using standard utensils. Pt. is able to water the cactus in her husband's room while holding and using a cup. Pt. Is able to hold a coffee cup, and drink from it. Pt. reports writing more on her daily menus, however writing continues to appear "shaky". Pt. Continues to present with tightness at the bilateral thumb webspace, and bilateral wrist flexor tightness, as well as digit MP, PIP, and DIP extensor tightness which continues to limit her ability to complete ADL tasks efficiently. Pt. continues to benefit  from working on impoving ROM in order to work towards increasing bilateral hand grasp on objects, and increase engagement of her bilateral hands during ADLs, and IADL tasks.   PATIENT EDUCATION: Education details: BUE ROM, hand function, UE functioning Person educated: Patient Education method: Explanation and Demonstration Education comprehension: verbalized understanding, returned demonstration, and needs further education   HOME EXERCISE PROGRAM Continue with ongoing HEPs  Measurements:  12/10/2022:  Shoulder flexion: R: 120(136), L: 138(150) Shoulder abduction: R: 108(120), L: 110(120) Elbow: R: 0-148, L: 0-146 Wrist flexion: R: 55(62), L: 78 Writ extension: R: 34(55), L: 3(4) RD: R: 12(20), L: 16(26) UD: R: 8(24), L: 6(18) Thumb radial abduction: R: 11(20), L: 8(20) Digit flexion to the [facility]: R:  2nd: 5cm(3.5cm), 3rd: 7.5 cm(5cm), 4th: 7cm(3cm), 5th: 6cm(5.5cm) L: 2nd: 8cm(8cm), 3rd: 8cm(8cm), 4th: 7.5cm(7cm), 5th: 7cm(7cm)  OT Long Term Goals - 07/06/21 1106                                                                                          Target Date: 03/03/2022    OT LONG TERM GOAL #1   Title Patient and caregiver will demonstrate understanding of home exercise program for ROM.    Baseline 12/09/2021: Pt. Reports the restorative ROM program has not resumed at the facility. 10/28/2021: Pt. Is attempting exercises/stretches at home. The facility restorative aides have not resumed exercises. 09/14/2021:  Pt. Reports inconsistencies with restorative ROM at  her care home. Pt/caregiver education to be provided about ongoing ROM needs.    Time 12    Period Weeks    Status Deferred     OT LONG TERM GOAL #2   Title Pt.  Will improve BUE strength to be able to sustain her UE's in elevation to efficiently, and effectively perform hair care, and self-grooming tasks.   Baseline 12/09/2021: right shoulder 4-/5, left shoulder 4/5. Pt. Has improved to  be able to  reach up for hair care, and self-grooming tasks. 10/28/2021: Right shoulder 3+/5, Left shoulder 4-/5 09/14/2021:  BUE shoulder strength: 3+/5 for shoulder flexion, and abduction.   Time 12    Period Weeks    Status Ongoing               OT LONG TERM GOAL #3   Title Patient will demonstrate improved composite finger flexion to hold deodorant to apply to underarms.    Baseline 12/09/2021: Bilateral hand/digit MP, PIP, and DIP extension tightness limits her ability achieve digit flexion to hold, and apply deodorant. 10/28/2021:  Pt. Continues to have difficulty holding the deodorant. 01/12/2021: Pt. Presents with limited digit extension. Pt. Is able to initiate holding deodorant, however is unable to hold it while using it.   Time 12    Period Weeks    Status Ongoing   Target Date                  OT LONG TERM GOAL #4   Title Pt. Will improve bilateral wrist extension in preparation for anticipating, and initiating reaching for objects at the table.    Baseline 12/09/2021: Right  34(55)  Left  3(4) 10/28/2021:  Right 22(38), Left 0(15) 09/14/2021:  Right: 17(35), left 2(15)   Time 12   Period weeks   Status Ongoing   Target Date      OT LONG TERM GOAL #5   Title Pt. will write, and sign her name with 100% legibility, and modified independence.    Baseline 12/09/2021: Pt. Reports legibility appears "shaky" when attempting signing her name. Pt.reports that she is writing more when filling out her menu. 10/28/2021: pt. is able to sign her name, however legibility continues to be inconsistent. Pt. Reports "shaky appearance". 09/14/2021: Pt. continues to fill out her daily menu and complete puzzles. Pt. Continues to present with limited writing legibility.   Time 12    Period Weeks    Status  New          OT LONG TERM GOAL  #  6   TITLE Pt. will turn pages in a book with modified independence.    Baseline 10/28/2021: Pt. Is able to consistently turn book pages. 09/14/2021: Pt. has difficulty turning  pages.    Time 12    Period Weeks    Status Achieved       OT LONG TERM GOAL  #7  TITLE Pt. Will increase  bilateral lateral pinch strength by 2 lbs to be able to securely grasp items during ADLs, and IADL tasks.   Baseline 12/09/2021:  Right: 6#, Left: 4#   Time 12  Period Weeks  Status New             Plan - 07/22/21 1737     Clinical Impression Statement Pt.'s recertification progress update was completed the last session. Goals were reviewed, and updated. Goals will remain the same for this 10th visit progress update. Pt. has made progress over this past progress reporting period since returning  to therapy following bilateral hip fractures,with left hip surgery. Pt. Has progressed with AROM in right shoulder flexion, and bilateral UE strength. Pt. is able to reach up in to her closet to access her jacket, and donn it over both arms in reverse position with the back facing the front. Pt. Continues to be able to brush her teeth with assist for set-up, Pt. Is independent with feeding herself more efficiently using standard utensils. Pt. is able to water the cactus in her husband's room while holding and using a cup. Pt. Is able to hold a coffee cup, and drink from it. Pt. reports writing more on her daily menus, however writing continues to appear "shaky". Pt. Continues to present with tightness at the bilateral thumb webspace, and bilateral wrist flexor tightness, as well as digit MP, PIP, and DIP extensor tightness which continues to limit her ability to complete ADL tasks efficiently. Pt. continues to benefit from working on impoving ROM in order to work towards increasing bilateral hand grasp on objects, and increase engagement of her bilateral hands during ADLs, and IADL tasks.                       OT Occupational Profile and History Detailed Assessment- Review of Records and additional review of physical, cognitive, psychosocial history related to current functional performance     Occupational performance deficits (Please refer to evaluation for details): ADL's;IADL's;Leisure;Social Participation    Body Structure / Function / Physical Skills ADL;Continence;Dexterity;Flexibility;Strength;ROM;Balance;Coordination;FMC;IADL;Endurance;UE functional use;Decreased knowledge of use of DME;[facility]    Psychosocial Skills Coping Strategies    Rehab Potential Fair    Clinical Decision Making Several treatment options, min-mod task modification necessary    Comorbidities Affecting Occupational Performance: Presence of comorbidities impacting occupational performance    Comorbidities impacting occupational performance description: contractures of bilateral hands, dependent transfers,    Modification or Assistance to Complete Evaluation  No modification of tasks or assist necessary to complete eval    OT Frequency 2x / week    OT Duration 12 weeks    OT Treatment/Interventions Self-care/ADL training;Cryotherapy;Therapeutic exercise;DME and/or AE instruction;Balance training;Neuromuscular education;Manual Therapy;Splinting;Moist Heat;Passive range of motion;Therapeutic activities;Patient/family education    Plan continue to progress ROM of digits, wrists and shoulders as it pertains to completion of ADL tasks that pt values.    Consulted and Agree with Plan of Care Patient            Harrel Carina, MS, OTR/L  Harrel Carina, OT 12/14/2021, 1:38 PM      `

## 2021-12-14 NOTE — Progress Notes (Addendum)
Location:  Other [facility].  Nursing Home Room Number: Autumn Messing Place of Service:  SNF (31) Provider:  Dr. Amada Kingfisher, MD  Patient Care Team: Dewayne Shorter, MD as PCP - [facility] Medicine)  Extended Emergency Contact Information Primary Emergency Contact: [name] Mobile Phone: [phone] Relation: Daughter Secondary Emergency Contact: Lindi Adie Address: [address]          [place], [place] [address] Johnnette Litter of [place] Phone: [phone] Relation: Spouse  Code Status:  Epic has DNR on File and [facility] has Full Code on File.  Goals of care: Advanced Directive information    12/14/2021   11:08 AM  Advanced Directives  Does Patient Have a Medical Advance Directive? Yes  Type of Advance Directive [place]  Does patient want to make changes to medical advance directive? No - Patient declined  Copy of [place] in Chart? Yes - validated most recent copy scanned in chart (See row information)     Chief Complaint  Patient presents with   Medical Management of Chronic Issues    Medical Management of Chronic Issues.     HPI:  Pt is a 87 y.o. female seen today for medical management of chronic diseases.    She feels bloated after her meals. She is wondering if it's her food or if it's her medications. She has two listings of diets -- one with grains and the old one says that oatmeal is moderate.   [facility]. She has been at [employer] right before moving into this building -- 2020. She has a daughter [name]. Puzzles and television are things she enjoys sports - [employer].   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    Past Medical History:  Diagnosis Date   Acute blood loss anemia    Arthritis    Cancer ([place])    skin   Central cord syndrome at C6 level of cervical spinal cord ([place]) 11/29/2017   Hypertension    Protein-calorie malnutrition, severe ([place])  01/24/2018   S/P insertion of IVC (inferior vena caval) filter 01/24/2018   Tetraparesis ([place])    Past Surgical History:  Procedure Laterality Date   ANTERIOR CERVICAL DECOMP/DISCECTOMY FUSION N/A 11/29/2017   Procedure: Cervical five-six, six-seven Anterior Cervical Decompression Fusion;  Surgeon: Judith Part, MD;  Location: [facility];  Service: Neurosurgery;  Laterality: N/A;  Cervical five-six, six-seven Anterior Cervical Decompression Fusion   CATARACT EXTRACTION     FEMUR IM NAIL Left 08/16/2021   Procedure: INTRAMEDULLARY (IM) NAIL FEMORAL;  Surgeon: Earnestine Leys, MD;  Location: [facility] ORS;  Service: Orthopedics;  Laterality: Left;   IR IVC FILTER PLMT / S&I /IMG GUID/MOD SED  12/08/2017    Allergies  Allergen Reactions   Other Swelling   Lisinopril-Hydrochlorothiazide Swelling    Swelling of the tongue   Beef-Derived Products Swelling    Facial and lip swelling (due to tick bite)   Dairy Aid [Tilactase] Swelling   Lisinopril-Hydrochlorothiazide Swelling    Tongue swelling   Pork-Derived Products Swelling    Outpatient Encounter Medications as of 12/14/2021  Medication Sig   acetaminophen (TYLENOL) 500 MG tablet Take 500 mg by mouth every 12 (twelve) hours as needed for mild pain.   acetaminophen (TYLENOL) 650 MG CR tablet Take 650 mg by mouth every 8 (eight) hours as needed for pain.   acetic acid 0.25 % irrigation Irrigate with 1 Application as directed daily at  6 (six) AM.   alendronate (FOSAMAX) 70 MG tablet Take 70 mg by mouth every Saturday.   Alpha-D-Galactosidase (BEANO) TABS Take 1 tablet by mouth 3 (three) times daily.   alum & mag hydroxide-simeth (MAALOX/MYLANTA) 200-200-20 MG/5ML suspension Take 30 mLs by mouth every 4 (four) hours as needed for indigestion.   calcium carbonate (TUMS - DOSED IN MG ELEMENTAL CALCIUM) 500 MG chewable tablet Chew 2 tablets by mouth every 8 (eight) hours as needed for heartburn.   Capsaicin 0.1 % CREA Apply 1 application   topically every evening. (Apply to hands)   carboxymethylcellulose (REFRESH PLUS) 0.5 % SOLN Place 2 drops into both eyes every 6 (six) hours as needed (dry eyes).   cholecalciferol (VITAMIN D3) 25 MCG (1000 UNIT) tablet Take 1,000 Units by mouth daily.   DULoxetine (CYMBALTA) 60 MG capsule Take 60 mg by mouth daily.   EPINEPHrine 0.3 mg/0.3 mL IJ SOAJ injection Inject 0.3 mg into the muscle as needed for anaphylaxis.   ferrous sulfate 325 (65 FE) MG tablet Take 325 mg by mouth 2 (two) times daily with a meal.   gabapentin (NEURONTIN) 600 MG tablet Take 600 mg by mouth at bedtime.   guaiFENesin (MUCINEX) 600 MG 12 hr tablet Take 600 mg by mouth 2 (two) times daily as needed for to loosen phlegm.   lactulose, encephalopathy, (CHRONULAC) 10 GM/15ML SOLN Take 30 mLs by mouth daily as needed (mild constipation).   loratadine (CLARITIN) 10 MG tablet Take 10 mg by mouth daily.    magnesium citrate solution Take 60-120 mLs by mouth daily as needed (constipation).   methylcellulose oral powder Take 1 packet by mouth at bedtime.   mometasone (ELOCON) 0.1 % lotion Apply 1 Application topically every Saturday at 6 PM. (Apply to the ear canal for eczema)   Multiple Vitamin (MULTIVITAMIN WITH MINERALS) TABS tablet Take 1 tablet by mouth daily.   polyethylene glycol (MIRALAX / GLYCOLAX) 17 g packet Take 17 g by mouth 2 (two) times daily.   rivaroxaban (XARELTO) 20 MG TABS tablet Take 1 tablet (20 mg total) by mouth daily with supper.   saccharomyces boulardii (FLORASTOR) 250 MG capsule Take 250 mg by mouth daily.   Saline Spray 0.2 % SOLN Place 2 sprays into both nostrils as needed (nasal congestion).   sodium phosphate (FLEET) 7-19 GM/118ML ENEM Place 1 enema rectally every other day as needed for severe constipation.   spironolactone (ALDACTONE) 50 MG tablet Take 25 mg by mouth 2 (two) times daily.   tiZANidine (ZANAFLEX) 2 MG tablet Take 2 mg by mouth 3 (three) times daily.   triamcinolone cream (KENALOG)  0.1 % Apply 1 Application topically every 8 (eight) hours as needed (rashy/itchy skin). (Apply to thighs, lower legs and upper arms)   [DISCONTINUED] gabapentin (NEURONTIN) 300 MG capsule Take 300 mg by mouth in the morning.   [DISCONTINUED] oxyCODONE-acetaminophen (PERCOCET/ROXICET) 5-325 MG tablet Take 1 tablet by mouth every 8 (eight) hours as needed for moderate pain.   [DISCONTINUED] diphenhydrAMINE (BENADRYL) 25 mg capsule Take 25-50 mg by mouth every 6 (six) hours as needed for itching.   [DISCONTINUED] ferrous sulfate 325 (65 FE) MG tablet Take 1 tablet (325 mg total) by mouth daily with breakfast.   [DISCONTINUED] Skin Protectants, Misc. (ENDIT EX) Apply 1 Application topically See admin instructions. Apply to gluteal fold/peri area once per shift   No facility-administered encounter medications on file as of 12/14/2021.    Review of Systems  All other systems reviewed and are  negative.   Immunization History  Administered Date(s) Administered   Influenza, High Dose Seasonal PF 11/17/2021   Moderna Sars-Covid-2 Vaccination 12/11/2021   Tdap 11/29/2017   Pertinent  Health Maintenance Due  Topic Date Due   INFLUENZA VACCINE  Completed   DEXA SCAN  Discontinued      08/18/2021   12:00 PM 08/18/2021    8:00 PM 08/19/2021   10:00 AM 08/19/2021    8:54 PM 08/20/2021    8:20 AM  Fall Risk  Patient Fall Risk Level High fall risk High fall risk High fall risk High fall risk High fall risk   Functional Status Survey:    Vitals:   12/14/21 1050  BP: 136/78  Pulse: 78  Resp: 18  Temp: 97.7 F (36.5 C)  Weight: 175 lb (79.4 kg)  Height: '5\' 7"'$  (1.702 m)   Body mass index is 27.41 kg/m. Physical Exam Vitals reviewed.  Constitutional:      Comments: In wheelchair with braces on bilateral legs. Hands with bicep/tricep mobility, minimal hand dexterity.   Cardiovascular:     Rate and Rhythm: Normal rate and regular rhythm.     Pulses: Normal pulses.     Heart sounds: Normal  heart sounds.  Pulmonary:     Effort: Pulmonary effort is normal.     Breath sounds: Normal breath sounds.  Abdominal:     General: Abdomen is flat.     Palpations: Abdomen is soft.  Skin:    General: Skin is warm and dry.  Neurological:     Mental Status: She is alert and oriented to person, place, and time. Mental status is at baseline.  Psychiatric:        Mood and Affect: Mood normal.        Behavior: Behavior normal.        Thought Content: Thought content normal.        Judgment: Judgment normal.     Labs reviewed: Recent Labs    08/18/21 0423 08/19/21 0333 08/20/21 0443  NA 124* 130* 130*  K 4.6 5.1 4.5  CL 95* 102 101  CO2 '23 23 26  '$ GLUCOSE 117* 117* 101*  BUN 29* 23 15  CREATININE 0.96 0.72 0.54  CALCIUM 7.1* 7.4* 7.1*   No results for input(s): "AST", "ALT", "ALKPHOS", "BILITOT", "PROT", "ALBUMIN" in the last 8760 hours. Recent Labs    08/15/21 1035 08/16/21 1333 08/18/21 0423 08/18/21 1759 08/19/21 0333 08/20/21 0443 08/20/21 1029  WBC 7.4   < > 13.0*  --  10.6* 8.9  --   NEUTROABS 5.2  --   --   --   --   --   --   HGB 12.9   < > 7.7* 9.1* 8.9* 7.8* 8.6*  HCT 41.3   < > 22.7* 27.4* 27.5* 23.7*  --   MCV 89.8   < > 87.6  --  88.4 87.1  --   PLT 335   < > 180  --  198 216  --    < > = values in this interval not displayed.   No results found for: "TSH" No results found for: "HGBA1C" No results found for: "CHOL", "HDL", "LDLCALC", "LDLDIRECT", "TRIG", "CHOLHDL"  Significant Diagnostic Results in last 30 days:  No results found.  Assessment/Plan 1. Quadriplegia ([place]) 2. Tetraparesis ([place]) 9. Central cord syndrome at C6 level of cervical spinal cord, sequela ([place]) 10. Neurogenic bladder 11. Neuropathic pain 7. Neurogenic bowel Patient with numerous problems after a fall  in 2019 resulting in fracture of spinal cord. She has paralysis and is in therapy. Continues to have issues with urination and bowel movements chronically but the symptoms are  stable at this time. Patient would like to decrease medications. Will discontinue scheduled gabapentin in the morning. Continue nightly gabapentin. Continue tizanidine to manage muscle spasms. Continue magnesium supplementation.   3. Closed fracture of hip, unspecified laterality, initial encounter ([place]) Fracture on 10/07/2021. She no longer has pain from fracture will discontinue narcotic. Continue therapy. Continue osteoporosis treatment with fosfamax 70 mg weekly.   4. Benign essential HTN BP well-controlled on Spironolacton 50 mg BID. Low normal. Will decrease dose to 25 mg BID.   5. Deep vein thrombosis (DVT) of proximal vein of both lower extremities, unspecified chronicity (HCC) Hx of DVT IVC filter in place. Continue Xarelto 10 mg daily.   6. Chronic allergic rhinitis No symptoms will continue medication PRN only.   12. Acute postoperative anemia due to expected blood loss Found in November postoperatively. Will recheck CBC at the end of 3 mo treatment. Continue iron 325 mg daily.   13. Protein-calorie malnutrition, severe (HCC) Encourage PO intake. No appetite problems at this time. Weight stable.   14. Depression with anxiety 15. Reactive depression Mood is stable. Continue cymbalta 60 mg daily.   16. Allergy to alpha-gal Avoid meat-containing products.     Family/ staff Communication: nursing  Labs/tests ordered:  none  Tomasa Rand, MD, [place] [phone]

## 2021-12-16 ENCOUNTER — Ambulatory Visit: Payer: Medicare HMO

## 2021-12-16 ENCOUNTER — Ambulatory Visit: Payer: Medicare HMO | Admitting: Occupational Therapy

## 2021-12-16 DIAGNOSIS — R278 Other lack of coordination: Secondary | ICD-10-CM

## 2021-12-16 DIAGNOSIS — M6281 Muscle weakness (generalized): Secondary | ICD-10-CM

## 2021-12-16 DIAGNOSIS — S72001A Fracture of unspecified part of neck of right femur, initial encounter for closed fracture: Secondary | ICD-10-CM

## 2021-12-16 DIAGNOSIS — R262 Difficulty in walking, not elsewhere classified: Secondary | ICD-10-CM

## 2021-12-16 DIAGNOSIS — R2689 Other abnormalities of gait and mobility: Secondary | ICD-10-CM

## 2021-12-16 DIAGNOSIS — R269 Unspecified abnormalities of gait and mobility: Secondary | ICD-10-CM

## 2021-12-16 NOTE — Therapy (Signed)
OCCUPATIONAL THERAPY TREATMENT NOTE    Patient Name: Sherry [name] MRN: [id number] DOB:Dec 07, 1934, 87 y.o., female Today's Date: 12/16/2021                                         PCP: Dr. Viviana Simpler REFERRING PROVIDER: Dr. Viviana Simpler   OT End of Session - 12/16/21 1806     Visit Number 21    Number of Visits 24    Date for OT Re-Evaluation 03/03/22    Authorization Time Period Progress report period starting 09/14/2021    OT Start Time 1150    OT Stop Time 1230    OT Time Calculation (min) 40 min    Activity Tolerance Patient tolerated treatment well    Behavior During Therapy [facility] for tasks assessed/performed               Past Medical History:  Diagnosis Date   Acute blood loss anemia    Arthritis    Cancer ([place])    skin   Central cord syndrome at C6 level of cervical spinal cord ([place]) 11/29/2017   Hypertension    Protein-calorie malnutrition, severe ([place]) 01/24/2018   S/P insertion of IVC (inferior vena caval) filter 01/24/2018   Tetraparesis ([place])    Past Surgical History:  Procedure Laterality Date   ANTERIOR CERVICAL DECOMP/DISCECTOMY FUSION N/A 11/29/2017   Procedure: Cervical five-six, six-seven Anterior Cervical Decompression Fusion;  Surgeon: Judith Part, MD;  Location: [facility];  Service: Neurosurgery;  Laterality: N/A;  Cervical five-six, six-seven Anterior Cervical Decompression Fusion   CATARACT EXTRACTION     FEMUR IM NAIL Left 08/16/2021   Procedure: INTRAMEDULLARY (IM) NAIL FEMORAL;  Surgeon: Earnestine Leys, MD;  Location: [facility] ORS;  Service: Orthopedics;  Laterality: Left;   IR IVC FILTER PLMT / S&I /IMG GUID/MOD SED  12/08/2017   Patient Active Problem List   Diagnosis Date Noted   Closed hip fracture ([place]) 08/15/2021   DVT (deep venous thrombosis) ([place]) 08/15/2021   Quadriplegia ([place]) 08/15/2021   Fall 08/15/2021   Acute appendicitis with appendiceal abscess 05/30/2018   Hypocalcemia 02/15/2018   Dysuria 02/15/2018   Bilateral  lower extremity edema 02/11/2018   Vaginal yeast infection 01/30/2018   Chest tightness 01/27/2018   Healthcare-associated pneumonia 01/25/2018   Pleural effusion, not elsewhere classified 01/25/2018   Acute deep vein thrombosis (DVT) of left lower extremity ([place]) 01/24/2018   Acute deep vein thrombosis (DVT) of right lower extremity ([place]) 01/24/2018   Chronic allergic rhinitis 01/24/2018   Depression with anxiety 01/24/2018   UTI due to Klebsiella species 01/24/2018   Protein-calorie malnutrition, severe ([place]) 01/24/2018   S/P insertion of IVC (inferior vena caval) filter 01/24/2018   Reactive depression    Hypokalemia    Leukocytosis    Essential hypertension    Trauma    Tetraparesis (HCC)    Neuropathic pain    Neurogenic bowel    Neurogenic bladder    Benign essential HTN    Acute postoperative anemia due to expected blood loss    Central cord syndrome at C6 level of cervical spinal cord ([place]) 11/29/2017   Central cord syndrome ([place]) 11/29/2017   SCC (squamous cell carcinoma) 04/08/2014    REFERRING DIAG: Central Cord Syndrome at C6 of the Cervical Spinal Cord, Fall with Bilateral Closed Hip Fractures, with ORIF repair with Intramedullary Nailing of the Left Hip Fracture.  THERAPY DIAG:  Muscle weakness (generalized)  Rationale for Evaluation and Treatment Rehabilitation  PERTINENT HISTORY: Patient s/p fall November 29, 2017 resulting in diagnosis of central cord syndrome at C 6 level.  She has had therapy in multiple venues but with recent move to [facility], therapy staff recommended she seek outpatient therapy for her needs. Pt. Had a recent fall with Bilateral Closed Hip Fractures, with ORIF repair with Intramedullary Nailing of the Left Hip Fracture.   PRECAUTIONS: None  SUBJECTIVE:   Pt. Reports that family is coming to visit for Thanksgiving.  PAIN:  Are you having pain? No     OBJECTIVE:   TODAY'S TREATMENT:   Manual Therapy:   Pt. tolerated soft  tissue massage to the volar, and dorsal surface of each digit on the bilateral hands secondary to stiffness. Manual therapy was performed independent of, and in preparation for therapeutic Ex.   Therapeutic Ex.    Pt. worked on [employer], and reciprocal motion using the UBE while seated for 8 min. with minimal resistance. Constant monitoring was provided. Pt. performed bilateral UE strengthening for bilateral shoulder flexion, chest presses, and elbow flexion, and extension. Pt. worked on bilateral UE strengthening. Pt. tolerated PROM followed by AROM bilateral wrist extension, PROM for bilateral digit MP, PIP, and DIP flexion, and extension, thumb radial, and palmar abduction in conjunction with moist heat.      Pt. Continues to progress with AROM in right shoulder flexion, and bilateral UE strength. Pt. continues to try to engage her bilateral hands more during daily ADL, and IADL tasks. Pt. Tolerated There. Ex., and manual therapy well today.  Pt. is able to reach up in to her closet to access her jacket, and donn it over both arms in reverse position with the back facing the front. Pt. Continues to be able to brush her teeth with assist for set-up, Pt. Is independent with feeding herself more efficiently using standard utensils. Pt. is able to water the cactus in her husband's room while holding and using a cup. Pt. Is able to hold a coffee cup, and drink from it. Pt. reports writing more on her daily menus, however writing continues to appear "shaky". Pt. Continues to present with tightness at the bilateral thumb webspace, and bilateral wrist flexor tightness, as well as digit MP, PIP, and DIP extensor tightness which continues to limit her ability to complete ADL tasks efficiently. Pt. continues to benefit from working on impoving ROM in order to work towards increasing bilateral hand grasp on objects, and increase engagement of her bilateral hands during ADLs, and IADL tasks.   PATIENT  EDUCATION: Education details: BUE ROM, hand function, UE functioning Person educated: Patient Education method: Explanation and Demonstration Education comprehension: verbalized understanding, returned demonstration, and needs further education   HOME EXERCISE PROGRAM Continue with ongoing HEPs  Measurements:  12/10/2022:  Shoulder flexion: R: 120(136), L: 138(150) Shoulder abduction: R: 108(120), L: 110(120) Elbow: R: 0-148, L: 0-146 Wrist flexion: R: 55(62), L: 78 Writ extension: R: 34(55), L: 3(4) RD: R: 12(20), L: 16(26) UD: R: 8(24), L: 6(18) Thumb radial abduction: R: 11(20), L: 8(20) Digit flexion to the [facility]: R:  2nd: 5cm(3.5cm), 3rd: 7.5 cm(5cm), 4th: 7cm(3cm), 5th: 6cm(5.5cm) L: 2nd: 8cm(8cm), 3rd: 8cm(8cm), 4th: 7.5cm(7cm), 5th: 7cm(7cm)            OT Long Term Goals - 07/06/21 1106  Target Date: 03/03/2022    OT LONG TERM GOAL #1   Title Patient and caregiver will demonstrate understanding of home exercise program for ROM.    Baseline 12/09/2021: Pt. Reports the restorative ROM program has not resumed at the facility. 10/28/2021: Pt. Is attempting exercises/stretches at home. The facility restorative aides have not resumed exercises. 09/14/2021:  Pt. Reports inconsistencies with restorative ROM at  her care home. Pt/caregiver education to be provided about ongoing ROM needs.    Time 12    Period Weeks    Status Deferred     OT LONG TERM GOAL #2   Title Pt.  Will improve BUE strength to be able to sustain her UE's in elevation to efficiently, and effectively perform hair care, and self-grooming tasks.   Baseline 12/09/2021: right shoulder 4-/5, left shoulder 4/5. Pt. Has improved to  be able to reach up for hair care, and self-grooming tasks. 10/28/2021: Right shoulder 3+/5, Left shoulder 4-/5 09/14/2021:  BUE shoulder strength: 3+/5 for shoulder flexion, and abduction.   Time  12    Period Weeks    Status Ongoing               OT LONG TERM GOAL #3   Title Patient will demonstrate improved composite finger flexion to hold deodorant to apply to underarms.    Baseline 12/09/2021: Bilateral hand/digit MP, PIP, and DIP extension tightness limits her ability achieve digit flexion to hold, and apply deodorant. 10/28/2021:  Pt. Continues to have difficulty holding the deodorant. 01/12/2021: Pt. Presents with limited digit extension. Pt. Is able to initiate holding deodorant, however is unable to hold it while using it.   Time 12    Period Weeks    Status Ongoing   Target Date                  OT LONG TERM GOAL #4   Title Pt. Will improve bilateral wrist extension in preparation for anticipating, and initiating reaching for objects at the table.    Baseline 12/09/2021: Right  34(55)  Left  3(4) 10/28/2021:  Right 22(38), Left 0(15) 09/14/2021:  Right: 17(35), left 2(15)   Time 12   Period weeks   Status Ongoing   Target Date      OT LONG TERM GOAL #5   Title Pt. will write, and sign her name with 100% legibility, and modified independence.    Baseline 12/09/2021: Pt. Reports legibility appears "shaky" when attempting signing her name. Pt.reports that she is writing more when filling out her menu. 10/28/2021: pt. is able to sign her name, however legibility continues to be inconsistent. Pt. Reports "shaky appearance". 09/14/2021: Pt. continues to fill out her daily menu and complete puzzles. Pt. Continues to present with limited writing legibility.   Time 12    Period Weeks    Status  New          OT LONG TERM GOAL  #6   TITLE Pt. will turn pages in a book with modified independence.    Baseline 10/28/2021: Pt. Is able to consistently turn book pages. 09/14/2021: Pt. has difficulty turning pages.    Time 12    Period Weeks    Status Achieved       OT LONG TERM GOAL  #7  TITLE Pt. Will increase  bilateral lateral pinch strength by 2 lbs to be able to securely grasp  items during ADLs, and IADL tasks.   Baseline 12/09/2021:  Right: 6#, Left: 4#  Time 12  Period Weeks  Status New             Plan - 07/22/21 1737     Clinical Impression Statement Pt. Continues to progress with AROM in right shoulder flexion, and bilateral UE strength. Pt. continues to try to engage her bilateral hands more during daily ADL, and IADL tasks. Pt. Tolerated There. Ex., and manual therapy well today.  Pt. is able to reach up in to her closet to access her jacket, and donn it over both arms in reverse position with the back facing the front. Pt. Continues to be able to brush her teeth with assist for set-up, Pt. Is independent with feeding herself more efficiently using standard utensils. Pt. is able to water the cactus in her husband's room while holding and using a cup. Pt. Is able to hold a coffee cup, and drink from it. Pt. reports writing more on her daily menus, however writing continues to appear "shaky". Pt. Continues to present with tightness at the bilateral thumb webspace, and bilateral wrist flexor tightness, as well as digit MP, PIP, and DIP extensor tightness which continues to limit her ability to complete ADL tasks efficiently. Pt. continues to benefit from working on impoving ROM in order to work towards increasing bilateral hand grasp on objects, and increase engagement of her bilateral hands during ADLs, and IADL tasks.                       OT Occupational Profile and History Detailed Assessment- Review of Records and additional review of physical, cognitive, psychosocial history related to current functional performance    Occupational performance deficits (Please refer to evaluation for details): ADL's;IADL's;Leisure;Social Participation    Body Structure / Function / Physical Skills ADL;Continence;Dexterity;Flexibility;Strength;ROM;Balance;Coordination;FMC;IADL;Endurance;UE functional use;Decreased knowledge of use of DME;[facility]    Psychosocial Skills  Coping Strategies    Rehab Potential Fair    Clinical Decision Making Several treatment options, min-mod task modification necessary    Comorbidities Affecting Occupational Performance: Presence of comorbidities impacting occupational performance    Comorbidities impacting occupational performance description: contractures of bilateral hands, dependent transfers,    Modification or Assistance to Complete Evaluation  No modification of tasks or assist necessary to complete eval    OT Frequency 2x / week    OT Duration 12 weeks    OT Treatment/Interventions Self-care/ADL training;Cryotherapy;Therapeutic exercise;DME and/or AE instruction;Balance training;Neuromuscular education;Manual Therapy;Splinting;Moist Heat;Passive range of motion;Therapeutic activities;Patient/family education    Plan continue to progress ROM of digits, wrists and shoulders as it pertains to completion of ADL tasks that pt values.    Consulted and Agree with Plan of Care Patient            Harrel Carina, MS, OTR/L  Harrel Carina, OT 12/16/2021, 6:08 PM      `

## 2021-12-16 NOTE — Therapy (Signed)
OUTPATIENT PHYSICAL THERAPY NEURO TREATMENT  Patient Name: Sherry [name] MRN: [id number] DOB:09-20-1934, 87 y.o., female Today's Date: 12/16/2021   PCP: Dewayne Shorter, MD REFERRING PROVIDER: Earnestine Leys   PT End of Session - 12/16/21 1106     Visit Number 14    Number of Visits 24    Date for PT Re-Evaluation 01/11/22    Authorization Time Period Initial cert [id number]/[id number]    Progress Note Due on Visit 20    PT Start Time 1101    PT Stop Time 1144    PT Time Calculation (min) 43 min    Equipment Utilized During Treatment Gait belt    Activity Tolerance Patient tolerated treatment well    Behavior During Therapy WFL for tasks assessed/performed                    Past Medical History:  Diagnosis Date   Acute blood loss anemia    Arthritis    Cancer ([place])    skin   Central cord syndrome at C6 level of cervical spinal cord ([place]) 11/29/2017   Hypertension    Protein-calorie malnutrition, severe ([place]) 01/24/2018   S/P insertion of IVC (inferior vena caval) filter 01/24/2018   Tetraparesis ([place])    Past Surgical History:  Procedure Laterality Date   ANTERIOR CERVICAL DECOMP/DISCECTOMY FUSION N/A 11/29/2017   Procedure: Cervical five-six, six-seven Anterior Cervical Decompression Fusion;  Surgeon: Judith Part, MD;  Location: [facility];  Service: Neurosurgery;  Laterality: N/A;  Cervical five-six, six-seven Anterior Cervical Decompression Fusion   CATARACT EXTRACTION     FEMUR IM NAIL Left 08/16/2021   Procedure: INTRAMEDULLARY (IM) NAIL FEMORAL;  Surgeon: Earnestine Leys, MD;  Location: [facility];  Service: Orthopedics;  Laterality: Left;   IR IVC FILTER PLMT / S&I /IMG GUID/MOD SED  12/08/2017   Patient Active Problem List   Diagnosis Date Noted   Closed hip fracture ([place]) 08/15/2021   DVT (deep venous thrombosis) ([place]) 08/15/2021   Quadriplegia ([place]) 08/15/2021   Fall 08/15/2021   Acute appendicitis with appendiceal abscess 05/30/2018    Hypocalcemia 02/15/2018   Dysuria 02/15/2018   Bilateral lower extremity edema 02/11/2018   Vaginal yeast infection 01/30/2018   Chest tightness 01/27/2018   Healthcare-associated pneumonia 01/25/2018   Pleural effusion, not elsewhere classified 01/25/2018   Acute deep vein thrombosis (DVT) of left lower extremity ([place]) 01/24/2018   Acute deep vein thrombosis (DVT) of right lower extremity ([place]) 01/24/2018   Chronic allergic rhinitis 01/24/2018   Depression with anxiety 01/24/2018   UTI due to Klebsiella species 01/24/2018   Protein-calorie malnutrition, severe ([place]) 01/24/2018   S/P insertion of IVC (inferior vena caval) filter 01/24/2018   Reactive depression    Hypokalemia    Leukocytosis    Essential hypertension    Trauma    Tetraparesis ([facility])    Neuropathic pain    Neurogenic bowel    Neurogenic bladder    Benign essential HTN    Acute postoperative anemia due to expected blood loss    Central cord syndrome at C6 level of cervical spinal cord ([place]) 11/29/2017   Central cord syndrome ([place]) 11/29/2017   SCC (squamous cell carcinoma) 04/08/2014    ONSET DATE: 08/15/2021 (fall with B hip fx); Initial injury was in 2019- quadraplegia due to central cord syndrom of C6 secondary to neck fracture.   REFERRING DIAG: Bilateral Hip fx; Left Open- s/p Left IM nail femoral on 08/16/2021; Right closed  THERAPY DIAG:  Muscle weakness (  generalized)  Other lack of coordination  Abnormality of gait and mobility  Difficulty in walking, not elsewhere classified  Other abnormalities of gait and mobility  Closed fracture of both hips, initial encounter [facility])  Rationale for Evaluation and Treatment Rehabilitation  SUBJECTIVE:                                                                                                                                                                                              SUBJECTIVE STATEMENT:     Patient reports doing well so far this week.  States she was checked out by her new PCP and no issues.   Pt accompanied by:  Paid caregiver-   PERTINENT HISTORY: Patient was previous patient (see previous episode for specifics) She experienced a fall at home on 08/15/2021 and was in [facility] from 08/15/2021- 08/20/2021.  Most recent history per Dr. Laurey Arrow on 08/20/2021.  Sherry [name] is a 87 y.o. 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   Patient has history of quadriplegia, and has been doing PT/OT with improvement of function. Pt has some use of both arms and can feed herself. She can walk a little bit with assistance. Pt accidentally fell at about 930 this morning after taking shower.  She injured her hips. Hospital Course:    Closed left hip fracture [facility]) s/p mechanical fall Closed right hip fracture (?old) Acute blood loss anemia --pt is s/p  INTRAMEDULLARY (IM) NAIL FEMORAL-left on 7/16  PAIN:  Are you having pain? NO: NPRS scale: 0/10 Pain location: Right hip Pain description: Ache Aggravating factors: any active Movement of right side Relieving factors: Rest  PRECAUTIONS: Fall  WEIGHT BEARING RESTRICTIONS Yes Bilateral non-weight bearing B LE's  FALLS: Has patient fallen in last 6 months? Yes. Number of falls 1  LIVING ENVIRONMENT: Lives with: lives with their family and lives in a nursing home Lives in: Other Long term care at [place]: No Has following equipment at home: Wheelchair (power), Ramped entry, and hoyer lift  PLOF: Needs assistance with ADLs, Needs assistance with homemaking, Needs assistance with gait, Needs assistance with transfers, and Leisure: Work puzzles, read, Watch TV  PATIENT GOALS Patient goal is to walk; Be able to strengthen my arms for better  use  OBJECTIVE:   DIAGNOSTIC FINDINGS: Overall cognitive statusEXAM: DG HIP (WITH OR WITHOUT PELVIS) 2-3V LEFT; DG HIP (WITH OR WITHOUT PELVIS) 2-3V RIGHT  COMPARISON:  CT of the abdomen and pelvis 01/21/2020   FINDINGS: Left proximal femur fracture noted just below the intertrochanteric region. Marked ventral angulation noted on the cross-table image. Slight varus angulation present   Comminuted intratrochanteric fracture present on the right also with marked ventral angulation.   IMPRESSION: 1. Bilateral hip fractures. 2. Comminuted intertrochanteric fracture of the right hip. 3. Angulated fracture of the proximal left femur just below the intertrochanteric region.     Electronically Signed   By: San Morelle M.D.   On: 08/15/2021 11:54:   COGNITION:  Within functional limits for tasks assessed   SENSATION: Light touch: Impaired   EDEMA:  None  Strength R/L 2-/2- Shoulder flexion (anterior deltoid/pec major/coracobrachialis, axillary n. (C5-6) and musculocutaneous n. (C5-7)) 2-/3+ (within available ROM around 130 on lef)Shoulder abduction (deltoid/supraspinatus, axillary/suprascapular n, C5) 3+/3+ Shoulder external rotation (infraspinatus/teres minor) 4/4 Shoulder internal rotation (subcapularis/lats/pec major) 3+/3+ Shoulder extension (posterior deltoid, lats, teres major, axillary/thoracodorsal n.) 3+/3+ Shoulder horizontal abduction 4/4 Elbow flexion (biceps brachii, brachialis, brachioradialis, musculoskeletal n, C5-6) 4/4 Elbow extension (triceps, radial n, C7) 2-/2- Wrist Extension 3+/3+ Wrist Flexion   LOWER EXTREMITY MMT:    MMT Right Eval Left Eval  Hip flexion    Hip extension    Hip abduction    Hip adduction    Hip internal rotation    Hip external rotation    Knee flexion    Knee extension    Ankle dorsiflexion    Ankle plantarflexion    Ankle inversion    Ankle eversion    (Blank rows = not tested) - BLE not tested  secondary to weight bearing precaution of B LE NWB  BED MOBILITY:  Unable to test due to NWB status of BLE- Patient currently dependent with all bed mobility using hoyer and caregiver assist  TRANSFERS: Currently BLE NWB- Dependent on use of hoyer for all transfers  FUNCTIONAL TESTs:   PATIENT SURVEYS:  FOTO 12  TODAY'S TREATMENT:  12/14/2021  - COPIED FROM [facility]- DR. Jonny Ruiz Note from 11/18/2021:  11/18/2021 11/18/2021 Assessment and Plan:  Bilateral hip fractures.   Recommendation:  The patient's PT may start with range of motion and more activity with the hips. I would like to avoid weightbearing still. We will see her back here in 6 weeks for an exam and x-ray of both hips.    THEREX: In power w/c (due to B NWB LE status)   -Gluteal sets with 5 sec hold x 15 reps (reclined position)  -Eccentric lowering LAQ with AAROM - hold 5 sec x 15 reps  -SLR 2 sets x 10 reps each side AAROM (reclined position)  -Reclined with LE elevated in chair- heel slides-  AAROM 2 x 12 reps each side -Hip abduction -AAROM 2 sets of 12 reps each LE -Seated LAQ (2# on right-partial ROM ) and resistive on left (3#) x 10 reps x 2 sets - Seated hip march (AAROM) x 10 reps x 2 sets        PATIENT EDUCATION: Education details: PT plan of care/use of resistive bands Person educated: Patient Education method: Explanation, Demonstration, Tactile cues, and Verbal cues Education comprehension: verbalized understanding, returned demonstration, verbal cues required, tactile cues required, and needs further education   HOME EXERCISE PROGRAM: Discussed use of theraband and demo shoulder flex/abd and elbow flex and ext with use of yellow TB. Patient required assist to position therband but able to perform well with only one person assist  Access  Code: [email or web address] URL: [email or web address] Date: 11/23/2021 Prepared by: Janna Arch Exercises - Seated Gluteal Sets - 2 x daily - 7 x  weekly - 2 sets - 10 reps - 5 hold - Seated Quad Set - 2 x daily - 7 x weekly - 2 sets - 10 reps - 5 hold - Seated Long Arc Quad - 2 x daily - 7 x weekly - 2 sets - 10 reps - 5 hold - Seated March - 2 x daily - 7 x weekly - 2 sets - 10 reps - 5 hold - Seated Hip Abduction - 2 x daily - 7 x weekly - 2 sets - 10 reps - 5 hold - Seated Shoulder Shrugs - 2 x daily - 7 x weekly - 2 sets - 10 reps - 5 hold - Wheelchair Pressure Relief - 2 x daily - 7 x weekly - 2 sets - 10 reps - 5 hold  GOALS: Goals reviewed with patient? Yes  SHORT TERM GOALS: Target date: 01/27/2022  Pt will be independent with initial UE strengthening HEP in order to improve strength and balance in order to decrease fall risk and improve function at home and work. Baseline: 10/19/2021- patient with no formal UE HEP; 12/02/2021= Patient verbalized knowledge of HEP including use of theraband for UE strengthening.  Goal status: GOAL MET  LONG TERM GOALS: Target date: 03/10/2022  Pt will be independent with final for UE/LE HEP in order to improve strength and balance in order to decrease fall risk and improve function at home and work. Baseline: Patient is currently BLE NWB and unable to participate in HEP. Has order for UE strengthening. Goal status: INITIAL  2.  Pt will improve FOTO to target score of 35  to display perceived improvements in ability to complete ADL's.  Baseline: 10/19/2021= 12; 12/02/2021= 17 Goal status: PROGRESSING  3.  Pt will increase strength of B UE  by at least 1/2 MMT grade in order to demonstrate improvement in strength and function  Baseline: patient has range of 2-/5 to 4/5 BUE Strength; 12/02/2021= 4/5 except with wrist ext.  Goal status: PROGRESSING  ASSESSMENT:  CLINICAL IMPRESSION: Patient performed well overall today- continues to tolerate some resistance with Left and even Right LE today. She was able to perform mostly 2 rounds of 12 -15 reps with fatigue as only limiting factor.   Pt will continue  to benefit from skilled PT services to improve strength in anticipation for BLE clearance for [place] activities.   OBJECTIVE IMPAIRMENTS Abnormal gait, decreased activity tolerance, decreased balance, decreased coordination, decreased endurance, decreased mobility, difficulty walking, decreased ROM, decreased strength, hypomobility, impaired flexibility, impaired UE functional use, postural dysfunction, and pain.   ACTIVITY LIMITATIONS carrying, lifting, bending, sitting, standing, squatting, sleeping, stairs, transfers, bed mobility, continence, bathing, toileting, dressing, self feeding, reach over head, hygiene/grooming, and caring for others  PARTICIPATION LIMITATIONS: meal prep, cleaning, laundry, medication management, personal finances, interpersonal relationship, driving, shopping, community activity, and yard work  PERSONAL FACTORS Age, Time since onset of injury/illness/exacerbation, and 1-2 comorbidities: HTN, cervical Sx  are also affecting patient's functional outcome.   REHAB POTENTIAL: Good  CLINICAL DECISION MAKING: Evolving/moderate complexity  EVALUATION COMPLEXITY: Moderate  PLAN: PT FREQUENCY: 2x/week  PT DURATION: 12 weeks  PLANNED INTERVENTIONS: Therapeutic exercises, Therapeutic activity, Neuromuscular re-education, Balance training, Gait training, Patient/Family education, Self Care, Joint mobilization, DME instructions, Dry Needling, Electrical stimulation, Wheelchair mobility training, Spinal mobilization, Cryotherapy, Moist heat, and Manual therapy  PLAN FOR NEXT SESSION: Instruct in  home program for UE strengthening  Ollen Bowl, PT 1:37 PM,12/16/21  Physical Therapist- [facility]  12/16/2021, 1:37 PM

## 2021-12-20 NOTE — Therapy (Signed)
OUTPATIENT PHYSICAL THERAPY NEURO TREATMENT  Patient Name: Sherry [name] MRN: [id number] DOB:Feb 08, 1934, 87 y.o., female Today's Date: 12/21/2021   PCP: Sherry Mealing, MD REFERRING PROVIDER: Deeann [name]   PT End of Session - 12/21/21 1150     Visit Number 15    Number of Visits 24    Date for PT Re-Evaluation 01/11/22    Authorization Time Period Initial cert 05/10/8117-14/[id number]/2956    Progress Note Due on Visit 20    PT Start Time 1145    PT Stop Time 1219    PT Time Calculation (min) 34 min    Equipment Utilized During Treatment Gait belt    Activity Tolerance Patient tolerated treatment well    Behavior During Therapy WFL for tasks assessed/performed                    Past Medical History:  Diagnosis Date   Acute blood loss anemia    Arthritis    Cancer (HCC)    skin   Central cord syndrome at C6 level of cervical spinal cord (HCC) 11/29/2017   Hypertension    Protein-calorie malnutrition, severe (HCC) 01/24/2018   S/P insertion of IVC (inferior vena caval) filter 01/24/2018   Tetraparesis (HCC)    Past Surgical History:  Procedure Laterality Date   ANTERIOR CERVICAL DECOMP/DISCECTOMY FUSION N/A 11/29/2017   Procedure: Cervical five-six, six-seven Anterior Cervical Decompression Fusion;  Surgeon: Sherry Pierini, MD;  Location: [facility] OR;  Service: Neurosurgery;  Laterality: N/A;  Cervical five-six, six-seven Anterior Cervical Decompression Fusion   CATARACT EXTRACTION     FEMUR IM NAIL Left 08/16/2021   Procedure: INTRAMEDULLARY (IM) NAIL FEMORAL;  Surgeon: Sherry Saint, MD;  Location: [facility] ORS;  Service: Orthopedics;  Laterality: Left;   IR IVC FILTER PLMT / S&I /IMG GUID/MOD SED  12/08/2017   Patient Active Problem List   Diagnosis Date Noted   Closed hip fracture (HCC) 08/15/2021   DVT (deep venous thrombosis) (HCC) 08/15/2021   Quadriplegia (HCC) 08/15/2021   Fall 08/15/2021   Constipation due to slow transit 08/31/2018   Trauma  06/05/2018   Neuropathic pain 06/05/2018   Neurogenic bowel 06/05/2018   Vaginal yeast infection 01/30/2018   Healthcare-associated pneumonia 01/25/2018   Chronic allergic rhinitis 01/24/2018   Depression with anxiety 01/24/2018   UTI due to Klebsiella species 01/24/2018   Protein-calorie malnutrition, severe (HCC) 01/24/2018   S/P insertion of IVC (inferior vena caval) filter 01/24/2018   Tetraparesis (HCC) 01/20/2018   Neurogenic bladder 01/20/2018   Reactive depression    Benign essential HTN    Acute postoperative anemia due to expected blood loss    Central cord syndrome at C6 level of cervical spinal cord (HCC) 11/29/2017   Allergy to alpha-gal 11/25/2016   SCC (squamous cell carcinoma) 04/08/2014    ONSET DATE: 08/15/2021 (fall with B hip fx); Initial injury was in 2019- quadraplegia due to central cord syndrom of C6 secondary to neck fracture.   REFERRING DIAG: Bilateral Hip fx; Left Open- s/p Left IM nail femoral on 08/16/2021; Right closed  THERAPY DIAG:  Muscle weakness (generalized)  Other lack of coordination  Abnormality of gait and mobility  Difficulty in walking, not elsewhere classified  Other abnormalities of gait and mobility  Closed fracture of both hips, initial encounter (HCC)  Unsteadiness on feet  Rationale for Evaluation and Treatment Rehabilitation  SUBJECTIVE:  SUBJECTIVE STATEMENT:     Patient reports having a good week so far- denies any pain or falls. States will miss Wednesday due to funeral for family member.   Pt accompanied by:  Paid caregiver-   PERTINENT HISTORY: Patient was previous patient (see previous episode for specifics) She experienced a fall at home on 08/15/2021 and was in Hospital from 08/15/2021- 08/20/2021.  Most recent history per Dr.  Shonna [name] on 08/20/2021.  Sherry [name] is a 87 y.o. 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   Patient has history of quadriplegia, and has been doing PT/OT with improvement of function. Pt has some use of both arms and can feed herself. She can walk a little bit with assistance. Pt accidentally fell at about 930 this morning after taking shower.  She injured her hips. Hospital Course:    Closed left hip fracture [facility]) s/p mechanical fall Closed right hip fracture (?old) Acute blood loss anemia --pt is s/p  INTRAMEDULLARY (IM) NAIL FEMORAL-left on 7/16  PAIN:  Are you having pain? NO: NPRS scale: 0/10 Pain location: Right hip Pain description: Ache Aggravating factors: any active Movement of right side Relieving factors: Rest  PRECAUTIONS: Fall  WEIGHT BEARING RESTRICTIONS Yes Bilateral non-weight bearing B LE's  FALLS: Has patient fallen in last 6 months? Yes. Number of falls 1  LIVING ENVIRONMENT: Lives with: lives with their family and lives in a nursing home Lives in: Other Long term care at [facility] Stairs: No Has following equipment at home: Wheelchair (power), Ramped entry, and hoyer lift  PLOF: Needs assistance with ADLs, Needs assistance with homemaking, Needs assistance with gait, Needs assistance with transfers, and Leisure: Work puzzles, read, Watch TV  PATIENT GOALS Patient goal is to walk; Be able to strengthen my arms for better use  OBJECTIVE:   DIAGNOSTIC FINDINGS: Overall cognitive statusEXAM: DG HIP (WITH OR WITHOUT PELVIS) 2-3V LEFT; DG HIP (WITH OR WITHOUT PELVIS) 2-3V RIGHT   COMPARISON:  CT of the abdomen and pelvis 01/21/2020   FINDINGS: Left proximal femur fracture noted just below the intertrochanteric region.  Marked ventral angulation noted on the cross-table image. Slight varus angulation present   Comminuted intratrochanteric fracture present on the right also with marked ventral angulation.   IMPRESSION: 1. Bilateral hip fractures. 2. Comminuted intertrochanteric fracture of the right hip. 3. Angulated fracture of the proximal left femur just below the intertrochanteric region.     Electronically Signed   By: Sherry [name] M.D.   On: 08/15/2021 11:54:   COGNITION:  Within functional limits for tasks assessed   SENSATION: Light touch: Impaired   EDEMA:  None  Strength R/L 2-/2- Shoulder flexion (anterior deltoid/pec major/coracobrachialis, axillary n. (C5-6) and musculocutaneous n. (C5-7)) 2-/3+ (within available ROM around 130 on lef)Shoulder abduction (deltoid/supraspinatus, axillary/suprascapular n, C5) 3+/3+ Shoulder external rotation (infraspinatus/teres minor) 4/4 Shoulder internal rotation (subcapularis/lats/pec major) 3+/3+ Shoulder extension (posterior deltoid, lats, teres major, axillary/thoracodorsal n.) 3+/3+ Shoulder horizontal abduction 4/4 Elbow flexion (biceps brachii, brachialis, brachioradialis, musculoskeletal n, C5-6) 4/4 Elbow extension (triceps, radial n, C7) 2-/2- Wrist Extension 3+/3+ Wrist Flexion   LOWER EXTREMITY MMT:    MMT Right Eval Left Eval  Hip flexion    Hip extension    Hip abduction    Hip adduction    Hip internal  rotation    Hip external rotation    Knee flexion    Knee extension    Ankle dorsiflexion    Ankle plantarflexion    Ankle inversion    Ankle eversion    (Blank rows = not tested) - BLE not tested secondary to weight bearing precaution of B LE NWB  BED MOBILITY:  Unable to test due to NWB status of BLE- Patient currently dependent with all bed mobility using hoyer and caregiver assist  TRANSFERS: Currently BLE NWB- Dependent on use of hoyer for all transfers  FUNCTIONAL TESTs:   PATIENT SURVEYS:   FOTO 12  TODAY'S TREATMENT:  12/21/2021  - COPIED FROM [facility]- DR. Robbie Lis Note from 11/18/2021:  11/18/2021 11/18/2021 Assessment and Plan:  Bilateral hip fractures.   Recommendation:  The patient's PT may start with range of motion and more activity with the hips. I would like to avoid weightbearing still. We will see her back here in 6 weeks for an exam and x-ray of both hips.    THEREX: In power w/c (due to B NWB LE status)   -Gluteal sets with 5 sec hold x 15 reps (seated position)  -Reclined quad set with 5 sec hold x 10 reps -AAROM Hip flex 2 sets x 15 reps each LE  -Eccentric lowering LAQ with AAROM - hold 5 sec x 15 reps (no weight on right and 4# AW on Left)   -SLR 2 sets x 10 reps each side AAROM (reclined position)  -Reclined with LE elevated in chair- heel slides-  AAROM 2 x 12 reps each side -Hip abduction -AAROM 2 sets of 12 reps each LE -Hip adduction with pillow squeeze hold 5 sec x 15 reps each.  -Seated LAQ (partial AROM then AAROM for full ROM ) and resistive on left (4#) x 12 reps x 2 sets - Reclined  hip ABD (AAROM) x 10 reps x 2 sets        PATIENT EDUCATION: Education details: PT plan of care/use of resistive bands Person educated: Patient Education method: Explanation, Demonstration, Tactile cues, and Verbal cues Education comprehension: verbalized understanding, returned demonstration, verbal cues required, tactile cues required, and needs further education   HOME EXERCISE PROGRAM: Discussed use of theraband and demo shoulder flex/abd and elbow flex and ext with use of yellow TB. Patient required assist to position therband but able to perform well with only one person assist  Access Code: [facility] URL: [email or web address] Date: 11/23/2021 Prepared by: Precious Bard Exercises - Seated Gluteal Sets - 2 x daily - 7 x weekly - 2 sets - 10 reps - 5 hold - Seated Quad Set - 2 x daily - 7 x weekly - 2 sets - 10 reps - 5 hold -  Seated Long Arc Quad - 2 x daily - 7 x weekly - 2 sets - 10 reps - 5 hold - Seated March - 2 x daily - 7 x weekly - 2 sets - 10 reps - 5 hold - Seated Hip Abduction - 2 x daily - 7 x weekly - 2 sets - 10 reps - 5 hold - Seated Shoulder Shrugs - 2 x daily - 7 x weekly - 2 sets - 10 reps - 5 hold - Wheelchair Pressure Relief - 2 x daily - 7 x weekly - 2 sets - 10 reps - 5 hold  GOALS: Goals reviewed with patient? Yes  SHORT TERM GOALS: Target date: 02/01/2022  Pt will be independent with initial UE  strengthening HEP in order to improve strength and balance in order to decrease fall risk and improve function at home and work. Baseline: 10/19/2021- patient with no formal UE HEP; 12/02/2021= Patient verbalized knowledge of HEP including use of theraband for UE strengthening.  Goal status: GOAL MET  LONG TERM GOALS: Target date: 03/15/2022  Pt will be independent with final for UE/LE HEP in order to improve strength and balance in order to decrease fall risk and improve function at home and work. Baseline: Patient is currently BLE NWB and unable to participate in HEP. Has order for UE strengthening. Goal status: INITIAL  2.  Pt will improve FOTO to target score of 35  to display perceived improvements in ability to complete ADL's.  Baseline: 10/19/2021= 12; 12/02/2021= 17 Goal status: PROGRESSING  3.  Pt will increase strength of B UE  by at least 1/2 MMT grade in order to demonstrate improvement in strength and function  Baseline: patient has range of 2-/5 to 4/5 BUE Strength; 12/02/2021= 4/5 except with wrist ext.  Goal status: PROGRESSING  ASSESSMENT:  CLINICAL IMPRESSION: Patient presents with good overall motivation. She was able to tolerate 4# AW on left with LAQ yet presented with more fatigue overall. She was not able to complete 2 rounds of therex with as many exercises today. She presents with more fatigue overall with right LE - unable to achieve full ROM with knee ext.   Pt will continue to  benefit from skilled PT services to improve strength in anticipation for BLE clearance for WB'ing activities.   OBJECTIVE IMPAIRMENTS Abnormal gait, decreased activity tolerance, decreased balance, decreased coordination, decreased endurance, decreased mobility, difficulty walking, decreased ROM, decreased strength, hypomobility, impaired flexibility, impaired UE functional use, postural dysfunction, and pain.   ACTIVITY LIMITATIONS carrying, lifting, bending, sitting, standing, squatting, sleeping, stairs, transfers, bed mobility, continence, bathing, toileting, dressing, self feeding, reach over head, hygiene/grooming, and caring for others  PARTICIPATION LIMITATIONS: meal prep, cleaning, laundry, medication management, personal finances, interpersonal relationship, driving, shopping, community activity, and yard work  PERSONAL FACTORS Age, Time since onset of injury/illness/exacerbation, and 1-2 comorbidities: HTN, cervical Sx  are also affecting patient's functional outcome.   REHAB POTENTIAL: Good  CLINICAL DECISION MAKING: Evolving/moderate complexity  EVALUATION COMPLEXITY: Moderate  PLAN: PT FREQUENCY: 2x/week  PT DURATION: 12 weeks  PLANNED INTERVENTIONS: Therapeutic exercises, Therapeutic activity, Neuromuscular re-education, Balance training, Gait training, Patient/Family education, Self Care, Joint mobilization, DME instructions, Dry Needling, Electrical stimulation, Wheelchair mobility training, Spinal mobilization, Cryotherapy, Moist heat, and Manual therapy  PLAN FOR NEXT SESSION: Instruct in home program for UE strengthening  Louis Meckel, PT 12:59 PM,12/21/21  Physical Therapist- [facility]  [facility]  12/21/2021, 12:59 PM

## 2021-12-21 ENCOUNTER — Ambulatory Visit: Payer: Medicare HMO

## 2021-12-21 ENCOUNTER — Ambulatory Visit: Payer: Medicare HMO | Admitting: Occupational Therapy

## 2021-12-21 DIAGNOSIS — S72002A Fracture of unspecified part of neck of left femur, initial encounter for closed fracture: Secondary | ICD-10-CM

## 2021-12-21 DIAGNOSIS — R262 Difficulty in walking, not elsewhere classified: Secondary | ICD-10-CM

## 2021-12-21 DIAGNOSIS — R269 Unspecified abnormalities of gait and mobility: Secondary | ICD-10-CM

## 2021-12-21 DIAGNOSIS — R2689 Other abnormalities of gait and mobility: Secondary | ICD-10-CM

## 2021-12-21 DIAGNOSIS — M6281 Muscle weakness (generalized): Secondary | ICD-10-CM | POA: Diagnosis not present

## 2021-12-21 DIAGNOSIS — R2681 Unsteadiness on feet: Secondary | ICD-10-CM

## 2021-12-21 DIAGNOSIS — R278 Other lack of coordination: Secondary | ICD-10-CM

## 2021-12-21 NOTE — Therapy (Addendum)
OCCUPATIONAL THERAPY TREATMENT NOTE    Patient Name: Sherry [name] MRN: [id number] DOB:[birth year], 87 y.o., female Today's Date: 12/21/2021                                         PCP: Dr. Viviana Simpler REFERRING PROVIDER: Dr. Viviana Simpler   OT End of Session - 12/21/21 1444     Visit Number 22    Number of Visits 48    Date for OT Re-Evaluation 03/03/22    Authorization Time Period Progress report period starting 09/14/2021    OT Start Time 1145    OT Stop Time 1230    OT Time Calculation (min) 45 min    Activity Tolerance Patient tolerated treatment well    Behavior During Therapy [facility] for tasks assessed/performed               Past Medical History:  Diagnosis Date   Acute blood loss anemia    Arthritis    Cancer ([place])    skin   Central cord syndrome at C6 level of cervical spinal cord ([place]) 11/29/2017   Hypertension    Protein-calorie malnutrition, severe ([place]) 01/24/2018   S/P insertion of IVC (inferior vena caval) filter 01/24/2018   Tetraparesis ([place])    Past Surgical History:  Procedure Laterality Date   ANTERIOR CERVICAL DECOMP/DISCECTOMY FUSION N/A 11/29/2017   Procedure: Cervical five-six, six-seven Anterior Cervical Decompression Fusion;  Surgeon: Judith Part, MD;  Location: [place];  Service: Neurosurgery;  Laterality: N/A;  Cervical five-six, six-seven Anterior Cervical Decompression Fusion   CATARACT EXTRACTION     FEMUR IM NAIL Left 08/16/2021   Procedure: INTRAMEDULLARY (IM) NAIL FEMORAL;  Surgeon: Earnestine Leys, MD;  Location: [facility];  Service: Orthopedics;  Laterality: Left;   IR IVC FILTER PLMT / S&I /IMG GUID/MOD SED  12/08/2017   Patient Active Problem List   Diagnosis Date Noted   Closed hip fracture ([place]) 08/15/2021   DVT (deep venous thrombosis) ([place]) 08/15/2021   Quadriplegia ([place]) 08/15/2021   Fall 08/15/2021   Constipation due to slow transit 08/31/2018   Trauma 06/05/2018   Neuropathic pain 06/05/2018   Neurogenic bowel  06/05/2018   Vaginal yeast infection 01/30/2018   Healthcare-associated pneumonia 01/25/2018   Chronic allergic rhinitis 01/24/2018   Depression with anxiety 01/24/2018   UTI due to Klebsiella species 01/24/2018   Protein-calorie malnutrition, severe ([place]) 01/24/2018   S/P insertion of IVC (inferior vena caval) filter 01/24/2018   Tetraparesis ([place]) 01/20/2018   Neurogenic bladder 01/20/2018   Reactive depression    Benign essential HTN    Acute postoperative anemia due to expected blood loss    Central cord syndrome at C6 level of cervical spinal cord ([place]) 11/29/2017   Allergy to alpha-gal 11/25/2016   SCC (squamous cell carcinoma) 04/08/2014    REFERRING DIAG: Central Cord Syndrome at C6 of the Cervical Spinal Cord, Fall with Bilateral Closed Hip Fractures, with ORIF repair with Intramedullary Nailing of the Left Hip Fracture.  THERAPY DIAG:  Muscle weakness (generalized)  Rationale for Evaluation and Treatment Rehabilitation  PERTINENT HISTORY: Patient s/p fall November 29, 2017 resulting in diagnosis of central cord syndrome at C 6 level.  She has had therapy in multiple venues but with recent move to [facility], therapy staff recommended she seek outpatient therapy for her needs. Pt. Had a recent fall with Bilateral Closed Hip Fractures,  with ORIF repair with Intramedullary Nailing of the Left Hip Fracture.   PRECAUTIONS: None  SUBJECTIVE:   Pt. reports that  she had to cancel her appointment on Wednesday due to a funeral.  PAIN:  Are you having pain? No     OBJECTIVE:   TODAY'S TREATMENT:   Manual Therapy:   Pt. tolerated soft tissue massage to the volar, and dorsal surface of each digit on the bilateral hands secondary to stiffness. Manual therapy was performed independent of, and in preparation for therapeutic Ex.   Therapeutic Ex.    Pt. worked on [employer], and reciprocal motion using the UBE while seated for 8 min. with minimal resistance. Constant  monitoring was provided. Pt. performed bilateral UE strengthening for bilateral shoulder flexion, chest presses, and elbow flexion, and extension. Pt. worked on bilateral UE strengthening. Pt. tolerated PROM followed by AROM bilateral wrist extension, PROM for bilateral digit MP, PIP, and DIP flexion, and extension, thumb radial, and palmar abduction in conjunction with moist heat.      Pt. continues to progress with AROM in right shoulder flexion, and bilateral UE strength. Pt. continues to try to engage her bilateral hands more during daily ADL, and IADL tasks. Pt. Tolerated There. Ex., and manual therapy well today.  Pt. is able to reach up in to her closet to access her jacket, and donn it over both arms in reverse position with the back facing the front. Pt. Continues to be able to brush her teeth with assist for set-up, Pt. Is independent with feeding herself more efficiently using standard utensils. Pt. is able to water the cactus in her husband's room while holding and using a cup. Pt. Is able to hold a coffee cup, and drink from it. Pt. reports writing more on her daily menus, however writing continues to appear "shaky". Pt. Continues to present with tightness at the bilateral thumb webspace, and bilateral wrist flexor tightness, as well as digit MP, PIP, and DIP extensor tightness which continues to limit her ability to complete ADL tasks efficiently. Pt. continues to benefit from working on impoving ROM in order to work towards increasing bilateral hand grasp on objects, and increase engagement of her bilateral hands during ADLs, and IADL tasks.   PATIENT EDUCATION: Education details: BUE ROM, hand function, UE functioning Person educated: Patient Education method: Explanation and Demonstration Education comprehension: verbalized understanding, returned demonstration, and needs further education   HOME EXERCISE PROGRAM Continue with ongoing HEPs  Measurements:  12/10/2022:  Shoulder  flexion: R: 120(136), L: 138(150) Shoulder abduction: R: 108(120), L: 110(120) Elbow: R: 0-148, L: 0-146 Wrist flexion: R: 55(62), L: 78 Writ extension: R: 34(55), L: 3(4) RD: R: 12(20), L: 16(26) UD: R: 8(24), L: 6(18) Thumb radial abduction: R: 11(20), L: 8(20) Digit flexion to the [facility]: R:  2nd: 5cm(3.5cm), 3rd: 7.5 cm(5cm), 4th: 7cm(3cm), 5th: 6cm(5.5cm) L: 2nd: 8cm(8cm), 3rd: 8cm(8cm), 4th: 7.5cm(7cm), 5th: 7cm(7cm)            OT Long Term Goals - 07/06/21 1106  Target Date: 03/03/2022    OT LONG TERM GOAL #1   Title Patient and caregiver will demonstrate understanding of home exercise program for ROM.    Baseline 12/09/2021: Pt. Reports the restorative ROM program has not resumed at the facility. 10/28/2021: Pt. Is attempting exercises/stretches at home. The facility restorative aides have not resumed exercises. 09/14/2021:  Pt. Reports inconsistencies with restorative ROM at  her care home. Pt/caregiver education to be provided about ongoing ROM needs.    Time 12    Period Weeks    Status Deferred     OT LONG TERM GOAL #2   Title Pt.  Will improve BUE strength to be able to sustain her UE's in elevation to efficiently, and effectively perform hair care, and self-grooming tasks.   Baseline 12/09/2021: right shoulder 4-/5, left shoulder 4/5. Pt. Has improved to  be able to reach up for hair care, and self-grooming tasks. 10/28/2021: Right shoulder 3+/5, Left shoulder 4-/5 09/14/2021:  BUE shoulder strength: 3+/5 for shoulder flexion, and abduction.   Time 12    Period Weeks    Status Ongoing               OT LONG TERM GOAL #3   Title Patient will demonstrate improved composite finger flexion to hold deodorant to apply to underarms.    Baseline 12/09/2021: Bilateral hand/digit MP, PIP, and DIP extension tightness limits her ability achieve digit flexion to hold, and apply deodorant. 10/28/2021:   Pt. Continues to have difficulty holding the deodorant. 01/12/2021: Pt. Presents with limited digit extension. Pt. Is able to initiate holding deodorant, however is unable to hold it while using it.   Time 12    Period Weeks    Status Ongoing   Target Date                  OT LONG TERM GOAL #4   Title Pt. Will improve bilateral wrist extension in preparation for anticipating, and initiating reaching for objects at the table.    Baseline 12/09/2021: Right  34(55)  Left  3(4) 10/28/2021:  Right 22(38), Left 0(15) 09/14/2021:  Right: 17(35), left 2(15)   Time 12   Period weeks   Status Ongoing   Target Date      OT LONG TERM GOAL #5   Title Pt. will write, and sign her name with 100% legibility, and modified independence.    Baseline 12/09/2021: Pt. Reports legibility appears "shaky" when attempting signing her name. Pt.reports that she is writing more when filling out her menu. 10/28/2021: pt. is able to sign her name, however legibility continues to be inconsistent. Pt. Reports "shaky appearance". 09/14/2021: Pt. continues to fill out her daily menu and complete puzzles. Pt. Continues to present with limited writing legibility.   Time 12    Period Weeks    Status  New          OT LONG TERM GOAL  #6   TITLE Pt. will turn pages in a book with modified independence.    Baseline 10/28/2021: Pt. Is able to consistently turn book pages. 09/14/2021: Pt. has difficulty turning pages.    Time 12    Period Weeks    Status Achieved       OT LONG TERM GOAL  #7  TITLE Pt. Will increase  bilateral lateral pinch strength by 2 lbs to be able to securely grasp items during ADLs, and IADL tasks.   Baseline 12/09/2021:  Right: 6#, Left: 4#  Time 12  Period Weeks  Status New             Plan - 07/22/21 1737     Clinical Impression Statement Pt. continues to progress with AROM in right shoulder flexion, and bilateral UE strength. Pt. continues to try to engage her bilateral hands more during  daily ADL, and IADL tasks. Pt. Tolerated There. Ex., and manual therapy well today.  Pt. is able to reach up in to her closet to access her jacket, and donn it over both arms in reverse position with the back facing the front. Pt. Continues to be able to brush her teeth with assist for set-up, Pt. Is independent with feeding herself more efficiently using standard utensils. Pt. is able to water the cactus in her husband's room while holding and using a cup. Pt. Is able to hold a coffee cup, and drink from it. Pt. reports writing more on her daily menus, however writing continues to appear "shaky". Pt. Continues to present with tightness at the bilateral thumb webspace, and bilateral wrist flexor tightness, as well as digit MP, PIP, and DIP extensor tightness which continues to limit her ability to complete ADL tasks efficiently. Pt. continues to benefit from working on impoving ROM in order to work towards increasing bilateral hand grasp on objects, and increase engagement of her bilateral hands during ADLs, and IADL tasks.                     OT Occupational Profile and History Detailed Assessment- Review of Records and additional review of physical, cognitive, psychosocial history related to current functional performance    Occupational performance deficits (Please refer to evaluation for details): ADL's;IADL's;Leisure;Social Participation    Body Structure / Function / Physical Skills ADL;Continence;Dexterity;Flexibility;Strength;ROM;Balance;Coordination;FMC;IADL;Endurance;UE functional use;Decreased knowledge of use of DME;G[facility]    Psychosocial Skills Coping Strategies    Rehab Potential Fair    Clinical Decision Making Several treatment options, min-mod task modification necessary    Comorbidities Affecting Occupational Performance: Presence of comorbidities impacting occupational performance    Comorbidities impacting occupational performance description: contractures of bilateral hands,  dependent transfers,    Modification or Assistance to Complete Evaluation  No modification of tasks or assist necessary to complete eval    OT Frequency 2x / week    OT Duration 12 weeks    OT Treatment/Interventions Self-care/ADL training;Cryotherapy;Therapeutic exercise;DME and/or AE instruction;Balance training;Neuromuscular education;Manual Therapy;Splinting;Moist Heat;Passive range of motion;Therapeutic activities;Patient/family education    Plan continue to progress ROM of digits, wrists and shoulders as it pertains to completion of ADL tasks that pt values.    Consulted and Agree with Plan of Care Patient            Harrel Carina, MS, OTR/L  Harrel Carina, OT 12/21/2021, 2:50 PM      `

## 2021-12-23 ENCOUNTER — Ambulatory Visit: Payer: Medicare HMO

## 2021-12-23 ENCOUNTER — Ambulatory Visit: Payer: Medicare HMO | Admitting: Occupational Therapy

## 2021-12-28 ENCOUNTER — Ambulatory Visit: Payer: Medicare HMO

## 2021-12-28 ENCOUNTER — Ambulatory Visit: Payer: Medicare HMO | Admitting: Occupational Therapy

## 2021-12-28 DIAGNOSIS — S72001A Fracture of unspecified part of neck of right femur, initial encounter for closed fracture: Secondary | ICD-10-CM

## 2021-12-28 DIAGNOSIS — R2681 Unsteadiness on feet: Secondary | ICD-10-CM

## 2021-12-28 DIAGNOSIS — R262 Difficulty in walking, not elsewhere classified: Secondary | ICD-10-CM

## 2021-12-28 DIAGNOSIS — S72002A Fracture of unspecified part of neck of left femur, initial encounter for closed fracture: Secondary | ICD-10-CM

## 2021-12-28 DIAGNOSIS — R278 Other lack of coordination: Secondary | ICD-10-CM

## 2021-12-28 DIAGNOSIS — M6281 Muscle weakness (generalized): Secondary | ICD-10-CM | POA: Diagnosis not present

## 2021-12-28 DIAGNOSIS — R269 Unspecified abnormalities of gait and mobility: Secondary | ICD-10-CM

## 2021-12-28 DIAGNOSIS — R2689 Other abnormalities of gait and mobility: Secondary | ICD-10-CM

## 2021-12-28 DIAGNOSIS — S14129S Central cord syndrome at unspecified level of cervical spinal cord, sequela: Secondary | ICD-10-CM

## 2021-12-28 NOTE — Therapy (Signed)
OUTPATIENT PHYSICAL THERAPY NEURO TREATMENT  Patient Name: Sherry [name] MRN: [id number] DOB:1934-04-09, 87 y.o., female Today's Date: 12/28/2021   PCP: Sherry Shorter, MD REFERRING PROVIDER: Earnestine [name]   PT End of Session - 12/28/21 1116     Visit Number 16    Number of Visits 24    Date for PT Re-Evaluation 01/11/22    Authorization Time Period Initial cert 02/25/[id number]/33/8250    Progress Note Due on Visit 20    PT Start Time 1145    PT Stop Time 1224    PT Time Calculation (min) 39 min    Equipment Utilized During Treatment Gait belt    Activity Tolerance Patient tolerated treatment well    Behavior During Therapy WFL for tasks assessed/performed                    Past Medical History:  Diagnosis Date   Acute blood loss anemia    Arthritis    Cancer ([place])    skin   Central cord syndrome at C6 level of cervical spinal cord ([place]) 11/29/2017   Hypertension    Protein-calorie malnutrition, severe ([place]) 01/24/2018   S/P insertion of IVC (inferior vena caval) filter 01/24/2018   Tetraparesis ([place])    Past Surgical History:  Procedure Laterality Date   ANTERIOR CERVICAL DECOMP/DISCECTOMY FUSION N/A 11/29/2017   Procedure: Cervical five-six, six-seven Anterior Cervical Decompression Fusion;  Surgeon: Sherry Part, MD;  Location: [place];  Service: Neurosurgery;  Laterality: N/A;  Cervical five-six, six-seven Anterior Cervical Decompression Fusion   CATARACT EXTRACTION     FEMUR IM NAIL Left 08/16/2021   Procedure: INTRAMEDULLARY (IM) NAIL FEMORAL;  Surgeon: Sherry Leys, MD;  Location: [facility];  Service: Orthopedics;  Laterality: Left;   IR IVC FILTER PLMT / S&I /IMG GUID/MOD SED  12/08/2017   Patient Active Problem List   Diagnosis Date Noted   Closed hip fracture ([place]) 08/15/2021   DVT (deep venous thrombosis) ([place]) 08/15/2021   Quadriplegia ([place]) 08/15/2021   Fall 08/15/2021   Constipation due to slow transit 08/31/2018   Trauma  06/05/2018   Neuropathic pain 06/05/2018   Neurogenic bowel 06/05/2018   Vaginal yeast infection 01/30/2018   Healthcare-associated pneumonia 01/25/2018   Chronic allergic rhinitis 01/24/2018   Depression with anxiety 01/24/2018   UTI due to Klebsiella species 01/24/2018   Protein-calorie malnutrition, severe ([place]) 01/24/2018   S/P insertion of IVC (inferior vena caval) filter 01/24/2018   Tetraparesis ([place]) 01/20/2018   Neurogenic bladder 01/20/2018   Reactive depression    Benign essential HTN    Acute postoperative anemia due to expected blood loss    Central cord syndrome at C6 level of cervical spinal cord ([place]) 11/29/2017   Allergy to alpha-gal 11/25/2016   SCC (squamous cell carcinoma) 04/08/2014    ONSET DATE: 08/15/2021 (fall with B hip fx); Initial injury was in 2019- quadraplegia due to central cord syndrom of C6 secondary to neck fracture.   REFERRING DIAG: Bilateral Hip fx; Left Open- s/p Left IM nail femoral on 08/16/2021; Right closed  THERAPY DIAG:  Muscle weakness (generalized)  Other lack of coordination  Abnormality of gait and mobility  Difficulty in walking, not elsewhere classified  Other abnormalities of gait and mobility  Closed fracture of both hips, initial encounter ([place])  Unsteadiness on feet  Central cord syndrome, sequela ([place])  Rationale for Evaluation and Treatment Rehabilitation  SUBJECTIVE:  SUBJECTIVE STATEMENT:    Patient reports that she had a good Thanksgiving weekend. States she has return appointment on Wed to follow up with orthopedics.     Pt accompanied by:  Paid caregiver-   PERTINENT HISTORY: Patient was previous patient (see previous episode for specifics) She experienced a fall at home on 08/15/2021 and was in [facility] from 08/15/2021-  08/20/2021.  Most recent history per Dr. Laurey [name] on 08/20/2021.  Sherry [name] is a 87 y.o. 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   Patient has history of quadriplegia, and has been doing PT/OT with improvement of function. Pt has some use of both arms and can feed herself. She can walk a little bit with assistance. Pt accidentally fell at about 930 this morning after taking shower.  She injured her hips. Hospital Course:    Closed left hip fracture [facility]) s/p mechanical fall Closed right hip fracture (?old) Acute blood loss anemia --pt is s/p  INTRAMEDULLARY (IM) NAIL FEMORAL-left on 7/16  PAIN:  Are you having pain? NO: NPRS scale: 0/10 Pain location: Right hip Pain description: Ache Aggravating factors: any active Movement of right side Relieving factors: Rest  PRECAUTIONS: Fall  WEIGHT BEARING RESTRICTIONS Yes Bilateral non-weight bearing B LE's  FALLS: Has patient fallen in last 6 months? Yes. Number of falls 1  LIVING ENVIRONMENT: Lives with: lives with their family and lives in a nursing home Lives in: Other Long term care at [place]: No Has following equipment at home: Wheelchair (power), Ramped entry, and hoyer lift  PLOF: Needs assistance with ADLs, Needs assistance with homemaking, Needs assistance with gait, Needs assistance with transfers, and Leisure: Work puzzles, read, Watch TV  PATIENT GOALS Patient goal is to walk; Be able to strengthen my arms for better use  OBJECTIVE:   DIAGNOSTIC FINDINGS: Overall cognitive statusEXAM: DG HIP (WITH OR WITHOUT PELVIS) 2-3V LEFT; DG HIP (WITH OR WITHOUT PELVIS) 2-3V RIGHT   COMPARISON:  CT of the abdomen and pelvis 01/21/2020   FINDINGS: Left proximal femur fracture noted just  below the intertrochanteric region. Marked ventral angulation noted on the cross-table image. Slight varus angulation present   Comminuted intratrochanteric fracture present on the right also with marked ventral angulation.   IMPRESSION: 1. Bilateral hip fractures. 2. Comminuted intertrochanteric fracture of the right hip. 3. Angulated fracture of the proximal left femur just below the intertrochanteric region.     Electronically Signed   By: San Morelle M.D.   On: 08/15/2021 11:54:   COGNITION:  Within functional limits for tasks assessed   SENSATION: Light touch: Impaired   EDEMA:  None  Strength R/L 2-/2- Shoulder flexion (anterior deltoid/pec major/coracobrachialis, axillary n. (C5-6) and musculocutaneous n. (C5-7)) 2-/3+ (within available ROM around 130 on lef)Shoulder abduction (deltoid/supraspinatus, axillary/suprascapular n, C5) 3+/3+ Shoulder external rotation (infraspinatus/teres minor) 4/4 Shoulder internal rotation (subcapularis/lats/pec major) 3+/3+ Shoulder extension (posterior deltoid, lats, teres major, axillary/thoracodorsal n.) 3+/3+ Shoulder horizontal abduction 4/4 Elbow flexion (biceps brachii, brachialis, brachioradialis, musculoskeletal n, C5-6) 4/4 Elbow extension (triceps, radial n, C7) 2-/2- Wrist Extension 3+/3+ Wrist Flexion   LOWER EXTREMITY MMT:    MMT Right Eval Left Eval  Hip flexion    Hip extension    Hip abduction    Hip adduction    Hip internal rotation  Hip external rotation    Knee flexion    Knee extension    Ankle dorsiflexion    Ankle plantarflexion    Ankle inversion    Ankle eversion    (Blank rows = not tested) - BLE not tested secondary to weight bearing precaution of B LE NWB  BED MOBILITY:  Unable to test due to NWB status of BLE- Patient currently dependent with all bed mobility using hoyer and caregiver assist  TRANSFERS: Currently BLE NWB- Dependent on use of hoyer for all  transfers  FUNCTIONAL TESTs:   PATIENT SURVEYS:  FOTO 12  TODAY'S TREATMENT:  12/28/2021  - COPIED FROM [facility]- DR. Jonny Ruiz Note from 11/18/2021:  11/18/2021 11/18/2021 Assessment and Plan:  Bilateral hip fractures.   Recommendation:  The patient's PT may start with range of motion and more activity with the hips. I would like to avoid weightbearing still. We will see her back here in 6 weeks for an exam and x-ray of both hips.    THEREX: In power w/c (due to B NWB LE status)   -Gluteal sets with 5 sec hold x 15 reps (seated position)  -AAROM Hip flex  x 15 reps each LE; increased to 2lb x 10 reps on 2nd set  -SLR 2 sets x 10 reps each side AAROM (reclined position)  -Reclined with LE elevated in chair- heel slides-  AAROM 2 x 15 reps each side -Hip abduction -AAROM 2 sets of 12 reps each LE -Hip adduction with pillow squeeze hold 5 sec x 15 reps each.  -Seated LAQ- pyramid- left LE and repeat of 12 reps (no weight right LE)   12 reps no weights  10 reps 2lb weight  8 reps 3lb weight        PATIENT EDUCATION: Education details: PT plan of care/use of resistive bands Person educated: Patient Education method: Explanation, Demonstration, Tactile cues, and Verbal cues Education comprehension: verbalized understanding, returned demonstration, verbal cues required, tactile cues required, and needs further education   HOME EXERCISE PROGRAM: Discussed use of theraband and demo shoulder flex/abd and elbow flex and ext with use of yellow TB. Patient required assist to position therband but able to perform well with only one person assist  Access Code: [facility] URL: [email or web address] Date: 11/23/2021 Prepared by: Janna Arch Exercises - Seated Gluteal Sets - 2 x daily - 7 x weekly - 2 sets - 10 reps - 5 hold - Seated Quad Set - 2 x daily - 7 x weekly - 2 sets - 10 reps - 5 hold - Seated Long Arc Quad - 2 x daily - 7 x weekly - 2 sets - 10 reps - 5 hold  - Seated March - 2 x daily - 7 x weekly - 2 sets - 10 reps - 5 hold - Seated Hip Abduction - 2 x daily - 7 x weekly - 2 sets - 10 reps - 5 hold - Seated Shoulder Shrugs - 2 x daily - 7 x weekly - 2 sets - 10 reps - 5 hold - Wheelchair Pressure Relief - 2 x daily - 7 x weekly - 2 sets - 10 reps - 5 hold  GOALS: Goals reviewed with patient? Yes  SHORT TERM GOALS: Target date: 02/08/2022  Pt will be independent with initial UE strengthening HEP in order to improve strength and balance in order to decrease fall risk and improve function at home and work. Baseline: 10/19/2021- patient with no formal UE HEP; 12/02/2021= Patient  verbalized knowledge of HEP including use of theraband for UE strengthening.  Goal status: GOAL MET  LONG TERM GOALS: Target date: 03/22/2022  Pt will be independent with final for UE/LE HEP in order to improve strength and balance in order to decrease fall risk and improve function at home and work. Baseline: Patient is currently BLE NWB and unable to participate in HEP. Has order for UE strengthening. Goal status: INITIAL  2.  Pt will improve FOTO to target score of 35  to display perceived improvements in ability to complete ADL's.  Baseline: 10/19/2021= 12; 12/02/2021= 17 Goal status: PROGRESSING  3.  Pt will increase strength of B UE  by at least 1/2 MMT grade in order to demonstrate improvement in strength and function  Baseline: patient has range of 2-/5 to 4/5 BUE Strength; 12/02/2021= 4/5 except with wrist ext.  Goal status: PROGRESSING  ASSESSMENT:  CLINICAL IMPRESSION: Treatment continues to focus on LE strengthening while she remains non-weight bearing. She was able to progress with more resistive ankle weights today without any significant issues. Pt will continue to benefit from skilled PT services to improve strength in anticipation for BLE clearance for [place] activities.   OBJECTIVE IMPAIRMENTS Abnormal gait, decreased activity tolerance, decreased balance,  decreased coordination, decreased endurance, decreased mobility, difficulty walking, decreased ROM, decreased strength, hypomobility, impaired flexibility, impaired UE functional use, postural dysfunction, and pain.   ACTIVITY LIMITATIONS carrying, lifting, bending, sitting, standing, squatting, sleeping, stairs, transfers, bed mobility, continence, bathing, toileting, dressing, self feeding, reach over head, hygiene/grooming, and caring for others  PARTICIPATION LIMITATIONS: meal prep, cleaning, laundry, medication management, personal finances, interpersonal relationship, driving, shopping, community activity, and yard work  PERSONAL FACTORS Age, Time since onset of injury/illness/exacerbation, and 1-2 comorbidities: HTN, cervical Sx  are also affecting patient's functional outcome.   REHAB POTENTIAL: Good  CLINICAL DECISION MAKING: Evolving/moderate complexity  EVALUATION COMPLEXITY: Moderate  PLAN: PT FREQUENCY: 2x/week  PT DURATION: 12 weeks  PLANNED INTERVENTIONS: Therapeutic exercises, Therapeutic activity, Neuromuscular re-education, Balance training, Gait training, Patient/Family education, Self Care, Joint mobilization, DME instructions, Dry Needling, Electrical stimulation, Wheelchair mobility training, Spinal mobilization, Cryotherapy, Moist heat, and Manual therapy  PLAN FOR NEXT SESSION: Instruct in home program for UE strengthening  Ollen Bowl, PT 12:44 PM,12/28/21  Physical Therapist- [facility]  12/28/2021, 12:44 PM

## 2021-12-28 NOTE — Therapy (Signed)
OCCUPATIONAL THERAPY TREATMENT NOTE    Patient Name: Sherry [name] MRN: [id number] DOB:09-Jul-1934, 87 y.o., female Today's Date: 12/28/2021                                         PCP: Dr. Viviana Simpler REFERRING PROVIDER: Dr. Viviana Simpler   OT End of Session - 12/28/21 1728     Visit Number 23    Number of Visits 54    Date for OT Re-Evaluation 03/03/22    Authorization Time Period Progress report period starting 09/14/2021    OT Start Time 58    OT Stop Time 1145    OT Time Calculation (min) 45 min    Equipment Utilized During Treatment moist heat    Activity Tolerance Patient tolerated treatment well    Behavior During Therapy WFL for tasks assessed/performed               Past Medical History:  Diagnosis Date   Acute blood loss anemia    Arthritis    Cancer ([place])    skin   Central cord syndrome at C6 level of cervical spinal cord ([place]) 11/29/2017   Hypertension    Protein-calorie malnutrition, severe ([place]) 01/24/2018   S/P insertion of IVC (inferior vena caval) filter 01/24/2018   Tetraparesis ([place])    Past Surgical History:  Procedure Laterality Date   ANTERIOR CERVICAL DECOMP/DISCECTOMY FUSION N/A 11/29/2017   Procedure: Cervical five-six, six-seven Anterior Cervical Decompression Fusion;  Surgeon: Judith Part, MD;  Location: [facility];  Service: Neurosurgery;  Laterality: N/A;  Cervical five-six, six-seven Anterior Cervical Decompression Fusion   CATARACT EXTRACTION     FEMUR IM NAIL Left 08/16/2021   Procedure: INTRAMEDULLARY (IM) NAIL FEMORAL;  Surgeon: Earnestine Leys, MD;  Location: [facility];  Service: Orthopedics;  Laterality: Left;   IR IVC FILTER PLMT / S&I /IMG GUID/MOD SED  12/08/2017   Patient Active Problem List   Diagnosis Date Noted   Closed hip fracture ([place]) 08/15/2021   DVT (deep venous thrombosis) ([place]) 08/15/2021   Quadriplegia ([place]) 08/15/2021   Fall 08/15/2021   Constipation due to slow transit 08/31/2018   Trauma 06/05/2018    Neuropathic pain 06/05/2018   Neurogenic bowel 06/05/2018   Vaginal yeast infection 01/30/2018   Healthcare-associated pneumonia 01/25/2018   Chronic allergic rhinitis 01/24/2018   Depression with anxiety 01/24/2018   UTI due to Klebsiella species 01/24/2018   Protein-calorie malnutrition, severe ([place]) 01/24/2018   S/P insertion of IVC (inferior vena caval) filter 01/24/2018   Tetraparesis ([place]) 01/20/2018   Neurogenic bladder 01/20/2018   Reactive depression    Benign essential HTN    Acute postoperative anemia due to expected blood loss    Central cord syndrome at C6 level of cervical spinal cord ([place]) 11/29/2017   Allergy to alpha-gal 11/25/2016   SCC (squamous cell carcinoma) 04/08/2014    REFERRING DIAG: Central Cord Syndrome at C6 of the Cervical Spinal Cord, Fall with Bilateral Closed Hip Fractures, with ORIF repair with Intramedullary Nailing of the Left Hip Fracture.  THERAPY DIAG:  Muscle weakness (generalized)  Rationale for Evaluation and Treatment Rehabilitation  PERTINENT HISTORY: Patient s/p fall November 29, 2017 resulting in diagnosis of central cord syndrome at C 6 level.  She has had therapy in multiple venues but with recent move to [facility], therapy staff recommended she seek outpatient therapy for her needs. Pt.  Had a recent fall with Bilateral Closed Hip Fractures, with ORIF repair with Intramedullary Nailing of the Left Hip Fracture.   PRECAUTIONS: None  SUBJECTIVE:   Pt. reports that  she has a follow-up Ortho MD appointment on Wednesday.  PAIN:  Are you having pain? No     OBJECTIVE:   TODAY'S TREATMENT:   Manual Therapy:   Pt. tolerated soft tissue massage to the volar, and dorsal surface of each digit on the bilateral hands secondary to stiffness. Manual therapy was performed independent of, and in preparation for therapeutic Ex.   Therapeutic Ex.    Pt. worked on [employer], and reciprocal motion using the UBE while seated for 8  min. with minimal resistance. Constant monitoring was provided. Pt. performed bilateral UE strengthening for bilateral shoulder flexion, chest presses, and elbow flexion, and extension. Pt. worked on bilateral UE strengthening. Pt. tolerated PROM followed by AROM bilateral wrist extension, PROM for bilateral digit MP, PIP, and DIP flexion, and extension, thumb radial, and palmar abduction in conjunction with moist heat.      Pt. has an orthopedic follow-up appointment this week. Pt. continues to progress with AROM in right shoulder flexion, and bilateral UE strength. Pt. continues to try to engage her bilateral hands more during daily ADL, and IADL tasks. Pt. Continues to tolerateThere. Ex., and manual therapy well today.  Pt. is able to reach up in to her closet to access her jacket, and donn it over both arms in reverse position with the back facing the front. Pt. Continues to be able to brush her teeth with assist for set-up, Pt. Is independent with feeding herself more efficiently using standard utensils. Pt. is able to water the cactus in her husband's room while holding and using a cup. Pt. Is able to hold a coffee cup, and drink from it. Pt. reports writing more on her daily menus, however writing continues to appear "shaky". Pt. Continues to present with tightness at the bilateral thumb webspace, and bilateral wrist flexor tightness, as well as digit MP, PIP, and DIP extensor tightness which continues to limit her ability to complete ADL tasks efficiently. Pt. continues to benefit from working on impoving ROM in order to work towards increasing bilateral hand grasp on objects, and increase engagement of her bilateral hands during ADLs, and IADL tasks.   PATIENT EDUCATION: Education details: BUE ROM, hand function, UE functioning Person educated: Patient Education method: Explanation and Demonstration Education comprehension: verbalized understanding, returned demonstration, and needs further  education   HOME EXERCISE PROGRAM Continue with ongoing HEPs  Measurements:  12/10/2022:  Shoulder flexion: R: 120(136), L: 138(150) Shoulder abduction: R: 108(120), L: 110(120) Elbow: R: 0-148, L: 0-146 Wrist flexion: R: 55(62), L: 78 Writ extension: R: 34(55), L: 3(4) RD: R: 12(20), L: 16(26) UD: R: 8(24), L: 6(18) Thumb radial abduction: R: 11(20), L: 8(20) Digit flexion to the [facility]: R:  2nd: 5cm(3.5cm), 3rd: 7.5 cm(5cm), 4th: 7cm(3cm), 5th: 6cm(5.5cm) L: 2nd: 8cm(8cm), 3rd: 8cm(8cm), 4th: 7.5cm(7cm), 5th: 7cm(7cm)            OT Long Term Goals - 07/06/21 1106  Target Date: 03/03/2022    OT LONG TERM GOAL #1   Title Patient and caregiver will demonstrate understanding of home exercise program for ROM.    Baseline 12/09/2021: Pt. Reports the restorative ROM program has not resumed at the facility. 10/28/2021: Pt. Is attempting exercises/stretches at home. The facility restorative aides have not resumed exercises. 09/14/2021:  Pt. Reports inconsistencies with restorative ROM at  her care home. Pt/caregiver education to be provided about ongoing ROM needs.    Time 12    Period Weeks    Status Deferred     OT LONG TERM GOAL #2   Title Pt.  Will improve BUE strength to be able to sustain her UE's in elevation to efficiently, and effectively perform hair care, and self-grooming tasks.   Baseline 12/09/2021: right shoulder 4-/5, left shoulder 4/5. Pt. Has improved to  be able to reach up for hair care, and self-grooming tasks. 10/28/2021: Right shoulder 3+/5, Left shoulder 4-/5 09/14/2021:  BUE shoulder strength: 3+/5 for shoulder flexion, and abduction.   Time 12    Period Weeks    Status Ongoing               OT LONG TERM GOAL #3   Title Patient will demonstrate improved composite finger flexion to hold deodorant to apply to underarms.    Baseline 12/09/2021: Bilateral hand/digit MP, PIP,  and DIP extension tightness limits her ability achieve digit flexion to hold, and apply deodorant. 10/28/2021:  Pt. Continues to have difficulty holding the deodorant. 01/12/2021: Pt. Presents with limited digit extension. Pt. Is able to initiate holding deodorant, however is unable to hold it while using it.   Time 12    Period Weeks    Status Ongoing   Target Date                  OT LONG TERM GOAL #4   Title Pt. Will improve bilateral wrist extension in preparation for anticipating, and initiating reaching for objects at the table.    Baseline 12/09/2021: Right  34(55)  Left  3(4) 10/28/2021:  Right 22(38), Left 0(15) 09/14/2021:  Right: 17(35), left 2(15)   Time 12   Period weeks   Status Ongoing   Target Date      OT LONG TERM GOAL #5   Title Pt. will write, and sign her name with 100% legibility, and modified independence.    Baseline 12/09/2021: Pt. Reports legibility appears "shaky" when attempting signing her name. Pt.reports that she is writing more when filling out her menu. 10/28/2021: pt. is able to sign her name, however legibility continues to be inconsistent. Pt. Reports "shaky appearance". 09/14/2021: Pt. continues to fill out her daily menu and complete puzzles. Pt. Continues to present with limited writing legibility.   Time 12    Period Weeks    Status  New          OT LONG TERM GOAL  #6   TITLE Pt. will turn pages in a book with modified independence.    Baseline 10/28/2021: Pt. Is able to consistently turn book pages. 09/14/2021: Pt. has difficulty turning pages.    Time 12    Period Weeks    Status Achieved       OT LONG TERM GOAL  #7  TITLE Pt. Will increase  bilateral lateral pinch strength by 2 lbs to be able to securely grasp items during ADLs, and IADL tasks.   Baseline 12/09/2021:  Right: 6#, Left: 4#  Time 12  Period Weeks  Status New             Plan - 07/22/21 1737     Clinical Impression Statement Pt. has an orthopedic follow-up appointment  this week. Pt. continues to progress with AROM in right shoulder flexion, and bilateral UE strength. Pt. continues to try to engage her bilateral hands more during daily ADL, and IADL tasks. Pt. Continues to tolerateThere. Ex., and manual therapy well today.  Pt. is able to reach up in to her closet to access her jacket, and donn it over both arms in reverse position with the back facing the front. Pt. Continues to be able to brush her teeth with assist for set-up, Pt. Is independent with feeding herself more efficiently using standard utensils. Pt. is able to water the cactus in her husband's room while holding and using a cup. Pt. Is able to hold a coffee cup, and drink from it. Pt. reports writing more on her daily menus, however writing continues to appear "shaky". Pt. Continues to present with tightness at the bilateral thumb webspace, and bilateral wrist flexor tightness, as well as digit MP, PIP, and DIP extensor tightness which continues to limit her ability to complete ADL tasks efficiently. Pt. continues to benefit from working on [employer]                OT Occupational Profile and History Detailed Assessment- Review of Records and additional review of physical, cognitive, psychosocial history related to current functional performance    Occupational performance deficits (Please refer to evaluation for details): ADL's;IADL's;Leisure;Social Participation    Body Structure / Function / Physical Skills ADL;Continence;Dexterity;Flexibility;Strength;ROM;Balance;Coordination;FMC;IADL;Endurance;UE functional use;Decreased knowledge of use of DME;[facility]    Psychosocial Skills Coping Strategies    Rehab Potential Fair    Clinical Decision Making Several treatment options, min-mod task modification necessary    Comorbidities Affecting Occupational Performance: Presence of comorbidities impacting occupational performance    Comorbidities impacting occupational performance description: contractures of  bilateral hands, dependent transfers,    Modification or Assistance to Complete Evaluation  No modification of tasks or assist necessary to complete eval    OT Frequency 2x / week    OT Duration 12 weeks    OT Treatment/Interventions Self-care/ADL training;Cryotherapy;Therapeutic exercise;DME and/or AE instruction;Balance training;Neuromuscular education;Manual Therapy;Splinting;Moist Heat;Passive range of motion;Therapeutic activities;Patient/family education    Plan continue to progress ROM of digits, wrists and shoulders as it pertains to completion of ADL tasks that pt values.    Consulted and Agree with Plan of Care Patient            Harrel Carina, MS, OTR/L  Harrel Carina, OT 12/28/2021, 5:30 PM      `

## 2021-12-29 ENCOUNTER — Ambulatory Visit: Payer: Medicare HMO | Admitting: Dermatology

## 2021-12-30 ENCOUNTER — Ambulatory Visit: Payer: Medicare HMO

## 2021-12-30 ENCOUNTER — Ambulatory Visit: Payer: Medicare HMO | Admitting: Occupational Therapy

## 2022-01-04 ENCOUNTER — Ambulatory Visit: Payer: Medicare HMO

## 2022-01-04 ENCOUNTER — Ambulatory Visit: Payer: Medicare HMO | Attending: Internal Medicine | Admitting: Occupational Therapy

## 2022-01-04 DIAGNOSIS — S72001A Fracture of unspecified part of neck of right femur, initial encounter for closed fracture: Secondary | ICD-10-CM | POA: Insufficient documentation

## 2022-01-04 DIAGNOSIS — R262 Difficulty in walking, not elsewhere classified: Secondary | ICD-10-CM | POA: Diagnosis present

## 2022-01-04 DIAGNOSIS — R2681 Unsteadiness on feet: Secondary | ICD-10-CM | POA: Diagnosis present

## 2022-01-04 DIAGNOSIS — S14129S Central cord syndrome at unspecified level of cervical spinal cord, sequela: Secondary | ICD-10-CM | POA: Diagnosis present

## 2022-01-04 DIAGNOSIS — R269 Unspecified abnormalities of gait and mobility: Secondary | ICD-10-CM | POA: Diagnosis present

## 2022-01-04 DIAGNOSIS — R2689 Other abnormalities of gait and mobility: Secondary | ICD-10-CM | POA: Insufficient documentation

## 2022-01-04 DIAGNOSIS — M6281 Muscle weakness (generalized): Secondary | ICD-10-CM | POA: Diagnosis present

## 2022-01-04 DIAGNOSIS — R278 Other lack of coordination: Secondary | ICD-10-CM

## 2022-01-04 DIAGNOSIS — S72002A Fracture of unspecified part of neck of left femur, initial encounter for closed fracture: Secondary | ICD-10-CM | POA: Diagnosis present

## 2022-01-04 NOTE — Therapy (Signed)
OCCUPATIONAL THERAPY TREATMENT NOTE    Patient Name: Sherry [name] MRN: [id number] DOB:Mar 19, 1934, 87 y.o., female Today's Date: 01/04/2022                                         PCP: Dr. Viviana Simpler REFERRING PROVIDER: Dr. Viviana Simpler   OT End of Session - 01/04/22 1214     Visit Number 24    Number of Visits 48    Date for OT Re-Evaluation 03/03/22    Authorization Time Period Progress report period starting 09/14/2021    OT Start Time 57    OT Stop Time 1145    OT Time Calculation (min) 45 min    Activity Tolerance Patient tolerated treatment well    Behavior During Therapy [facility] for tasks assessed/performed               Past Medical History:  Diagnosis Date   Acute blood loss anemia    Arthritis    Cancer ([place])    skin   Central cord syndrome at C6 level of cervical spinal cord ([place]) 11/29/2017   Hypertension    Protein-calorie malnutrition, severe ([place]) 01/24/2018   S/P insertion of IVC (inferior vena caval) filter 01/24/2018   Tetraparesis ([place])    Past Surgical History:  Procedure Laterality Date   ANTERIOR CERVICAL DECOMP/DISCECTOMY FUSION N/A 11/29/2017   Procedure: Cervical five-six, six-seven Anterior Cervical Decompression Fusion;  Surgeon: Judith Part, MD;  Location: [place];  Service: Neurosurgery;  Laterality: N/A;  Cervical five-six, six-seven Anterior Cervical Decompression Fusion   CATARACT EXTRACTION     FEMUR IM NAIL Left 08/16/2021   Procedure: INTRAMEDULLARY (IM) NAIL FEMORAL;  Surgeon: Earnestine Leys, MD;  Location: [facility];  Service: Orthopedics;  Laterality: Left;   IR IVC FILTER PLMT / S&I /IMG GUID/MOD SED  12/08/2017   Patient Active Problem List   Diagnosis Date Noted   Closed hip fracture ([place]) 08/15/2021   DVT (deep venous thrombosis) ([place]) 08/15/2021   Quadriplegia ([place]) 08/15/2021   Fall 08/15/2021   Constipation due to slow transit 08/31/2018   Trauma 06/05/2018   Neuropathic pain 06/05/2018   Neurogenic bowel  06/05/2018   Vaginal yeast infection 01/30/2018   Healthcare-associated pneumonia 01/25/2018   Chronic allergic rhinitis 01/24/2018   Depression with anxiety 01/24/2018   UTI due to Klebsiella species 01/24/2018   Protein-calorie malnutrition, severe ([place]) 01/24/2018   S/P insertion of IVC (inferior vena caval) filter 01/24/2018   Tetraparesis ([place]) 01/20/2018   Neurogenic bladder 01/20/2018   Reactive depression    Benign essential HTN    Acute postoperative anemia due to expected blood loss    Central cord syndrome at C6 level of cervical spinal cord ([place]) 11/29/2017   Allergy to alpha-gal 11/25/2016   SCC (squamous cell carcinoma) 04/08/2014    REFERRING DIAG: Central Cord Syndrome at C6 of the Cervical Spinal Cord, Fall with Bilateral Closed Hip Fractures, with ORIF repair with Intramedullary Nailing of the Left Hip Fracture.  THERAPY DIAG:  Muscle weakness (generalized)  Other lack of coordination  Rationale for Evaluation and Treatment Rehabilitation  PERTINENT HISTORY: Patient s/p fall November 29, 2017 resulting in diagnosis of central cord syndrome at C 6 level.  She has had therapy in multiple venues but with recent move to [facility], therapy staff recommended she seek outpatient therapy for her needs. Pt. Had a recent fall  with Bilateral Closed Hip Fractures, with ORIF repair with Intramedullary Nailing of the Left Hip Fracture.   PRECAUTIONS: None  SUBJECTIVE:   Pt. reports that  she has a follow-up Ortho MD appointment on Wednesday.  PAIN:  Are you having pain? No     OBJECTIVE:   TODAY'S TREATMENT:   Manual Therapy:   Pt. tolerated soft tissue massage to the volar, and dorsal surface of each digit on the bilateral hands secondary to stiffness. Manual therapy was performed independent of, and in preparation for therapeutic Ex.   Therapeutic Ex.    Pt. worked on [employer], and reciprocal motion using the UBE while seated for 8 min. with minimal  resistance. Constant monitoring was provided. Pt. performed bilateral UE strengthening for bilateral shoulder flexion, chest presses, and elbow flexion, and extension. Pt. worked on bilateral UE strengthening. Pt. tolerated PROM followed by AROM bilateral wrist extension, PROM for bilateral digit MP, PIP, and DIP flexion, and extension, thumb radial, and palmar abduction in conjunction with moist heat.      Pt. reports that she has her next follow-up orthopedic appointment in February. Pt. continues to progress with AROM in right shoulder flexion, and bilateral UE strength. Pt. continues to try to engage her bilateral hands more during daily ADL, and IADL tasks. Pt. Continues to tolerateThere. Ex., and manual therapy well today.  Pt. Continues to be able to reach up in to her closet to access her jacket, and donn it over both arms in reverse position with the back facing the front. Pt. Continues to be able to brush her teeth with assist for set-up, Pt. Is independent with feeding herself more efficiently using standard utensils. Pt. is able to water the cactus in her husband's room while holding and using a cup. Pt. Is able to hold a coffee cup, and drink from it. Pt. reports writing more on her daily menus, however writing continues to appear "shaky". Pt. Continues to present with tightness at the bilateral thumb webspace, and bilateral wrist flexor tightness, as well as digit MP, PIP, and DIP extensor tightness which continues to limit her ability to complete ADL tasks efficiently. Pt. continues to benefit from working on impoving ROM in order to work towards increasing bilateral hand grasp on objects, and increase engagement of her bilateral hands during ADLs, and IADL tasks.   PATIENT EDUCATION: Education details: BUE ROM, hand function, UE functioning Person educated: Patient Education method: Explanation and Demonstration Education comprehension: verbalized understanding, returned demonstration, and  needs further education   HOME EXERCISE PROGRAM Continue with ongoing HEPs  Measurements:  12/10/2022:  Shoulder flexion: R: 120(136), L: 138(150) Shoulder abduction: R: 108(120), L: 110(120) Elbow: R: 0-148, L: 0-146 Wrist flexion: R: 55(62), L: 78 Writ extension: R: 34(55), L: 3(4) RD: R: 12(20), L: 16(26) UD: R: 8(24), L: 6(18) Thumb radial abduction: R: 11(20), L: 8(20) Digit flexion to the [facility]: R:  2nd: 5cm(3.5cm), 3rd: 7.5 cm(5cm), 4th: 7cm(3cm), 5th: 6cm(5.5cm) L: 2nd: 8cm(8cm), 3rd: 8cm(8cm), 4th: 7.5cm(7cm), 5th: 7cm(7cm)            OT Long Term Goals - 07/06/21 1106  Target Date: 03/03/2022    OT LONG TERM GOAL #1   Title Patient and caregiver will demonstrate understanding of home exercise program for ROM.    Baseline 12/09/2021: Pt. Reports the restorative ROM program has not resumed at the facility. 10/28/2021: Pt. Is attempting exercises/stretches at home. The facility restorative aides have not resumed exercises. 09/14/2021:  Pt. Reports inconsistencies with restorative ROM at  her care home. Pt/caregiver education to be provided about ongoing ROM needs.    Time 12    Period Weeks    Status Deferred     OT LONG TERM GOAL #2   Title Pt.  Will improve BUE strength to be able to sustain her UE's in elevation to efficiently, and effectively perform hair care, and self-grooming tasks.   Baseline 12/09/2021: right shoulder 4-/5, left shoulder 4/5. Pt. Has improved to  be able to reach up for hair care, and self-grooming tasks. 10/28/2021: Right shoulder 3+/5, Left shoulder 4-/5 09/14/2021:  BUE shoulder strength: 3+/5 for shoulder flexion, and abduction.   Time 12    Period Weeks    Status Ongoing               OT LONG TERM GOAL #3   Title Patient will demonstrate improved composite finger flexion to hold deodorant to apply to underarms.    Baseline 12/09/2021: Bilateral  hand/digit MP, PIP, and DIP extension tightness limits her ability achieve digit flexion to hold, and apply deodorant. 10/28/2021:  Pt. Continues to have difficulty holding the deodorant. 01/12/2021: Pt. Presents with limited digit extension. Pt. Is able to initiate holding deodorant, however is unable to hold it while using it.   Time 12    Period Weeks    Status Ongoing   Target Date                  OT LONG TERM GOAL #4   Title Pt. Will improve bilateral wrist extension in preparation for anticipating, and initiating reaching for objects at the table.    Baseline 12/09/2021: Right  34(55)  Left  3(4) 10/28/2021:  Right 22(38), Left 0(15) 09/14/2021:  Right: 17(35), left 2(15)   Time 12   Period weeks   Status Ongoing   Target Date      OT LONG TERM GOAL #5   Title Pt. will write, and sign her name with 100% legibility, and modified independence.    Baseline 12/09/2021: Pt. Reports legibility appears "shaky" when attempting signing her name. Pt.reports that she is writing more when filling out her menu. 10/28/2021: pt. is able to sign her name, however legibility continues to be inconsistent. Pt. Reports "shaky appearance". 09/14/2021: Pt. continues to fill out her daily menu and complete puzzles. Pt. Continues to present with limited writing legibility.   Time 12    Period Weeks    Status  New          OT LONG TERM GOAL  #6   TITLE Pt. will turn pages in a book with modified independence.    Baseline 10/28/2021: Pt. Is able to consistently turn book pages. 09/14/2021: Pt. has difficulty turning pages.    Time 12    Period Weeks    Status Achieved       OT LONG TERM GOAL  #7  TITLE Pt. Will increase  bilateral lateral pinch strength by 2 lbs to be able to securely grasp items during ADLs, and IADL tasks.   Baseline 12/09/2021:  Right: 6#, Left: 4#  Time 12  Period Weeks  Status New             Plan - 07/22/21 1737     Clinical Impression Statement Pt. reports that she has  her next follow-up orthopedic appointment in February. Pt. continues to progress with AROM in right shoulder flexion, and bilateral UE strength. Pt. continues to try to engage her bilateral hands more during daily ADL, and IADL tasks. Pt. Continues to tolerateThere. Ex., and manual therapy well today.  Pt. Continues to be able to reach up in to her closet to access her jacket, and donn it over both arms in reverse position with the back facing the front. Pt. Continues to be able to brush her teeth with assist for set-up, Pt. Is independent with feeding herself more efficiently using standard utensils. Pt. is able to water the cactus in her husband's room while holding and using a cup. Pt. Is able to hold a coffee cup, and drink from it. Pt. reports writing more on her daily menus, however writing continues to appear "shaky". Pt. Continues to present with tightness at the bilateral thumb webspace, and bilateral wrist flexor tightness, as well as digit MP, PIP, and DIP extensor tightness which continues to limit her ability to complete ADL tasks efficiently. Pt. continues to benefit from working on impoving ROM in order to work towards increasing bilateral hand grasp on objects, and increase engagement of her bilateral hands during ADLs, and IADL task               OT Occupational Profile and History Detailed Assessment- Review of Records and additional review of physical, cognitive, psychosocial history related to current functional performance    Occupational performance deficits (Please refer to evaluation for details): ADL's;IADL's;Leisure;Social Participation    Body Structure / Function / Physical Skills ADL;Continence;Dexterity;Flexibility;Strength;ROM;Balance;Coordination;FMC;IADL;Endurance;UE functional use;Decreased knowledge of use of DME;GMC    Psychosocial Skills Coping Strategies    Rehab Potential Fair    Clinical Decision Making Several treatment options, min-mod task modification necessary     Comorbidities Affecting Occupational Performance: Presence of comorbidities impacting occupational performance    Comorbidities impacting occupational performance description: contractures of bilateral hands, dependent transfers,    Modification or Assistance to Complete Evaluation  No modification of tasks or assist necessary to complete eval    OT Frequency 2x / week    OT Duration 12 weeks    OT Treatment/Interventions Self-care/ADL training;Cryotherapy;Therapeutic exercise;DME and/or AE instruction;Balance training;Neuromuscular education;Manual Therapy;Splinting;Moist Heat;Passive range of motion;Therapeutic activities;Patient/family education    Plan continue to progress ROM of digits, wrists and shoulders as it pertains to completion of ADL tasks that pt values.    Consulted and Agree with Plan of Care Patient            Harrel Carina, MS, OTR/L  Harrel Carina, OT 01/04/2022, 12:16 PM      `

## 2022-01-04 NOTE — Therapy (Signed)
OUTPATIENT PHYSICAL THERAPY NEURO TREATMENT  Patient Name: Sherry [name] MRN: [id number] DOB:04-19-1934, 87 y.o., female Today's Date: 01/04/2022   PCP: Dewayne Shorter, MD REFERRING PROVIDER: Earnestine Leys   PT End of Session - 01/04/22 1050     Visit Number 17    Number of Visits 24    Date for PT Re-Evaluation 01/11/22    Authorization Time Period Initial cert [id number]/[id number]    Progress Note Due on Visit 20    PT Start Time 7673    PT Stop Time 1227    PT Time Calculation (min) 42 min    Equipment Utilized During Treatment Gait belt    Activity Tolerance Patient tolerated treatment well    Behavior During Therapy WFL for tasks assessed/performed                    Past Medical History:  Diagnosis Date   Acute blood loss anemia    Arthritis    Cancer ([place])    skin   Central cord syndrome at C6 level of cervical spinal cord ([place]) 11/29/2017   Hypertension    Protein-calorie malnutrition, severe ([place]) 01/24/2018   S/P insertion of IVC (inferior vena caval) filter 01/24/2018   Tetraparesis ([place])    Past Surgical History:  Procedure Laterality Date   ANTERIOR CERVICAL DECOMP/DISCECTOMY FUSION N/A 11/29/2017   Procedure: Cervical five-six, six-seven Anterior Cervical Decompression Fusion;  Surgeon: Judith Part, MD;  Location: [facility];  Service: Neurosurgery;  Laterality: N/A;  Cervical five-six, six-seven Anterior Cervical Decompression Fusion   CATARACT EXTRACTION     FEMUR IM NAIL Left 08/16/2021   Procedure: INTRAMEDULLARY (IM) NAIL FEMORAL;  Surgeon: Earnestine Leys, MD;  Location: [facility];  Service: Orthopedics;  Laterality: Left;   IR IVC FILTER PLMT / S&I /IMG GUID/MOD SED  12/08/2017   Patient Active Problem List   Diagnosis Date Noted   Closed hip fracture ([place]) 08/15/2021   DVT (deep venous thrombosis) ([place]) 08/15/2021   Quadriplegia ([place]) 08/15/2021   Fall 08/15/2021   Constipation due to slow transit 08/31/2018   Trauma  06/05/2018   Neuropathic pain 06/05/2018   Neurogenic bowel 06/05/2018   Vaginal yeast infection 01/30/2018   Healthcare-associated pneumonia 01/25/2018   Chronic allergic rhinitis 01/24/2018   Depression with anxiety 01/24/2018   UTI due to Klebsiella species 01/24/2018   Protein-calorie malnutrition, severe ([place]) 01/24/2018   S/P insertion of IVC (inferior vena caval) filter 01/24/2018   Tetraparesis ([place]) 01/20/2018   Neurogenic bladder 01/20/2018   Reactive depression    Benign essential HTN    Acute postoperative anemia due to expected blood loss    Central cord syndrome at C6 level of cervical spinal cord ([place]) 11/29/2017   Allergy to alpha-gal 11/25/2016   SCC (squamous cell carcinoma) 04/08/2014    ONSET DATE: 08/15/2021 (fall with B hip fx); Initial injury was in 2019- quadraplegia due to central cord syndrom of C6 secondary to neck fracture.   REFERRING DIAG: Bilateral Hip fx; Left Open- s/p Left IM nail femoral on 08/16/2021; Right closed  THERAPY DIAG:  Muscle weakness (generalized)  Other lack of coordination  Abnormality of gait and mobility  Difficulty in walking, not elsewhere classified  Other abnormalities of gait and mobility  Closed fracture of both hips, initial encounter ([place])  Unsteadiness on feet  Rationale for Evaluation and Treatment Rehabilitation  SUBJECTIVE:  SUBJECTIVE STATEMENT:    Patient reports she had her follow up with Ortho and that her hips are healing slowly. States MD wants her to remain NWB until Feb 01, 2022 then she may resume.     Pt accompanied by:  Paid caregiver-   PERTINENT HISTORY: Patient was previous patient (see previous episode for specifics) She experienced a fall at home on 08/15/2021 and was in [facility] from 08/15/2021-  08/20/2021.  Most recent history per Dr. Laurey Arrow on 08/20/2021.  Sherry [name] is a 87 y.o. 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   Patient has history of quadriplegia, and has been doing PT/OT with improvement of function. Pt has some use of both arms and can feed herself. She can walk a little bit with assistance. Pt accidentally fell at about 930 this morning after taking shower.  She injured her hips. Hospital Course:    Closed left hip fracture [facility]) s/p mechanical fall Closed right hip fracture (?old) Acute blood loss anemia --pt is s/p  INTRAMEDULLARY (IM) NAIL FEMORAL-left on 7/16  PAIN:  Are you having pain? NO: NPRS scale: 0/10 Pain location: Right hip Pain description: Ache Aggravating factors: any active Movement of right side Relieving factors: Rest  PRECAUTIONS: Fall  WEIGHT BEARING RESTRICTIONS Yes Bilateral non-weight bearing B LE's  FALLS: Has patient fallen in last 6 months? Yes. Number of falls 1  LIVING ENVIRONMENT: Lives with: lives with their family and lives in a nursing home Lives in: Other Long term care at [place]: No Has following equipment at home: Wheelchair (power), Ramped entry, and hoyer lift  PLOF: Needs assistance with ADLs, Needs assistance with homemaking, Needs assistance with gait, Needs assistance with transfers, and Leisure: Work puzzles, read, Watch TV  PATIENT GOALS Patient goal is to walk; Be able to strengthen my arms for better use  OBJECTIVE:   DIAGNOSTIC FINDINGS: Overall cognitive statusEXAM: DG HIP (WITH OR WITHOUT PELVIS) 2-3V LEFT; DG HIP (WITH OR WITHOUT PELVIS) 2-3V RIGHT   COMPARISON:  CT of the abdomen and pelvis 01/21/2020   FINDINGS: Left proximal femur fracture noted just  below the intertrochanteric region. Marked ventral angulation noted on the cross-table image. Slight varus angulation present   Comminuted intratrochanteric fracture present on the right also with marked ventral angulation.   IMPRESSION: 1. Bilateral hip fractures. 2. Comminuted intertrochanteric fracture of the right hip. 3. Angulated fracture of the proximal left femur just below the intertrochanteric region.     Electronically Signed   By: San Morelle M.D.   On: 08/15/2021 11:54:   COGNITION:  Within functional limits for tasks assessed   SENSATION: Light touch: Impaired   EDEMA:  None  Strength R/L 2-/2- Shoulder flexion (anterior deltoid/pec major/coracobrachialis, axillary n. (C5-6) and musculocutaneous n. (C5-7)) 2-/3+ (within available ROM around 130 on lef)Shoulder abduction (deltoid/supraspinatus, axillary/suprascapular n, C5) 3+/3+ Shoulder external rotation (infraspinatus/teres minor) 4/4 Shoulder internal rotation (subcapularis/lats/pec major) 3+/3+ Shoulder extension (posterior deltoid, lats, teres major, axillary/thoracodorsal n.) 3+/3+ Shoulder horizontal abduction 4/4 Elbow flexion (biceps brachii, brachialis, brachioradialis, musculoskeletal n, C5-6) 4/4 Elbow extension (triceps, radial n, C7) 2-/2- Wrist Extension 3+/3+ Wrist Flexion   LOWER EXTREMITY MMT:    MMT Right Eval Left Eval  Hip flexion    Hip extension    Hip abduction  Hip adduction    Hip internal rotation    Hip external rotation    Knee flexion    Knee extension    Ankle dorsiflexion    Ankle plantarflexion    Ankle inversion    Ankle eversion    (Blank rows = not tested) - BLE not tested secondary to weight bearing precaution of B LE NWB  BED MOBILITY:  Unable to test due to NWB status of BLE- Patient currently dependent with all bed mobility using hoyer and caregiver assist  TRANSFERS: Currently BLE NWB- Dependent on use of hoyer for all  transfers  FUNCTIONAL TESTs:   PATIENT SURVEYS:  FOTO 12  TODAY'S TREATMENT:  01/04/2022  -COPIED FROM [facility]-DR. H. MILLER note from 12/30/2021 12/30/2021 12/30/2021 Assessment and Plan:  Impression: 1. Bilateral hip fractures.  Recommendation: The patient will continue the current program until 02/01/2022 when she may try some weightbearing exercises as well. The patient will follow up in 03/2022 for exam and x-ray of both hips.  ATTESTATION:  This note has been generated by Romero Liner and edited by Christiana Fuchs, Quality Documentation Specialist.     - COPIED FROM [facility]- DR. Jonny Ruiz Note from 11/18/2021:  11/18/2021 11/18/2021 Assessment and Plan:  Bilateral hip fractures.   Recommendation:  The patient's PT may start with range of motion and more activity with the hips. I would like to avoid weightbearing still. We will see her back here in 6 weeks for an exam and x-ray of both hips.    THEREX: In power w/c (due to B NWB LE status)   -Gluteal sets with 5 sec hold x 15 reps (seated position)  -AAROM Hip flex  x 15 reps each LE; increased to 2lb x 10 reps on 2nd set  -SLR 2 sets x 10 reps each side AAROM (reclined position)  --Hip abduction -AAROM 2 sets of 15 reps each LE -Hip adduction with ball squeeze hold 5 sec x 15 reps each.  -Seated LAQ- pyramid- left LE    10 reps 2lb weight  8 reps 3lb weight  8 reps 3lb weight -Seated LAQ on right - 2 sets of 12 reps and last set with eccentric hold x 5 more reps.        PATIENT EDUCATION: Education details: PT plan of care/use of resistive bands Person educated: Patient Education method: Explanation, Demonstration, Tactile cues, and Verbal cues Education comprehension: verbalized understanding, returned demonstration, verbal cues required, tactile cues required, and needs further education   HOME EXERCISE PROGRAM: Discussed use of theraband and demo shoulder flex/abd and elbow flex and ext with  use of yellow TB. Patient required assist to position therband but able to perform well with only one person assist  Access Code: [facility] URL: [email or web address] Date: 11/23/2021 Prepared by: Janna Arch Exercises - Seated Gluteal Sets - 2 x daily - 7 x weekly - 2 sets - 10 reps - 5 hold - Seated Quad Set - 2 x daily - 7 x weekly - 2 sets - 10 reps - 5 hold - Seated Long Arc Quad - 2 x daily - 7 x weekly - 2 sets - 10 reps - 5 hold - Seated March - 2 x daily - 7 x weekly - 2 sets - 10 reps - 5 hold - Seated Hip Abduction - 2 x daily - 7 x weekly - 2 sets - 10 reps - 5 hold - Seated Shoulder Shrugs - 2 x daily - 7 x weekly -  2 sets - 10 reps - 5 hold - Wheelchair Pressure Relief - 2 x daily - 7 x weekly - 2 sets - 10 reps - 5 hold  GOALS: Goals reviewed with patient? Yes  SHORT TERM GOALS: Target date: 02/15/2022  Pt will be independent with initial UE strengthening HEP in order to improve strength and balance in order to decrease fall risk and improve function at home and work. Baseline: 10/19/2021- patient with no formal UE HEP; 12/02/2021= Patient verbalized knowledge of HEP including use of theraband for UE strengthening.  Goal status: GOAL MET  LONG TERM GOALS: Target date: 03/29/2022  Pt will be independent with final for UE/LE HEP in order to improve strength and balance in order to decrease fall risk and improve function at home and work. Baseline: Patient is currently BLE NWB and unable to participate in HEP. Has order for UE strengthening. Goal status: INITIAL  2.  Pt will improve FOTO to target score of 35  to display perceived improvements in ability to complete ADL's.  Baseline: 10/19/2021= 12; 12/02/2021= 17 Goal status: PROGRESSING  3.  Pt will increase strength of B UE  by at least 1/2 MMT grade in order to demonstrate improvement in strength and function  Baseline: patient has range of 2-/5 to 4/5 BUE Strength; 12/02/2021= 4/5 except with wrist ext.  Goal status:  PROGRESSING  ASSESSMENT:  CLINICAL IMPRESSION: Patient presents with good motivation for today's session. Not able to progress to any weight bearing per MD instruction. (See above note). She continued with LE strengthening with emphasis on pyramid strengthening today. She was able to progress with more sets with LAQ and improving overall ability on right LE. Pt will continue to benefit from skilled PT services to improve strength in anticipation for BLE clearance for [place] activities.   OBJECTIVE IMPAIRMENTS Abnormal gait, decreased activity tolerance, decreased balance, decreased coordination, decreased endurance, decreased mobility, difficulty walking, decreased ROM, decreased strength, hypomobility, impaired flexibility, impaired UE functional use, postural dysfunction, and pain.   ACTIVITY LIMITATIONS carrying, lifting, bending, sitting, standing, squatting, sleeping, stairs, transfers, bed mobility, continence, bathing, toileting, dressing, self feeding, reach over head, hygiene/grooming, and caring for others  PARTICIPATION LIMITATIONS: meal prep, cleaning, laundry, medication management, personal finances, interpersonal relationship, driving, shopping, community activity, and yard work  PERSONAL FACTORS Age, Time since onset of injury/illness/exacerbation, and 1-2 comorbidities: HTN, cervical Sx  are also affecting patient's functional outcome.   REHAB POTENTIAL: Good  CLINICAL DECISION MAKING: Evolving/moderate complexity  EVALUATION COMPLEXITY: Moderate  PLAN: PT FREQUENCY: 2x/week  PT DURATION: 12 weeks  PLANNED INTERVENTIONS: Therapeutic exercises, Therapeutic activity, Neuromuscular re-education, Balance training, Gait training, Patient/Family education, Self Care, Joint mobilization, DME instructions, Dry Needling, Electrical stimulation, Wheelchair mobility training, Spinal mobilization, Cryotherapy, Moist heat, and Manual therapy  PLAN FOR NEXT SESSION: Instruct in home  program for UE strengthening  Ollen Bowl, PT 3:47 PM,01/04/22  Physical Therapist- [facility]  01/04/2022, 3:47 PM

## 2022-01-06 ENCOUNTER — Ambulatory Visit: Payer: Medicare HMO

## 2022-01-06 ENCOUNTER — Ambulatory Visit: Payer: Medicare HMO | Admitting: Occupational Therapy

## 2022-01-06 DIAGNOSIS — M6281 Muscle weakness (generalized): Secondary | ICD-10-CM | POA: Diagnosis not present

## 2022-01-06 DIAGNOSIS — R262 Difficulty in walking, not elsewhere classified: Secondary | ICD-10-CM

## 2022-01-06 DIAGNOSIS — R278 Other lack of coordination: Secondary | ICD-10-CM

## 2022-01-06 DIAGNOSIS — R2689 Other abnormalities of gait and mobility: Secondary | ICD-10-CM

## 2022-01-06 DIAGNOSIS — R269 Unspecified abnormalities of gait and mobility: Secondary | ICD-10-CM

## 2022-01-06 DIAGNOSIS — S72001A Fracture of unspecified part of neck of right femur, initial encounter for closed fracture: Secondary | ICD-10-CM

## 2022-01-06 DIAGNOSIS — R2681 Unsteadiness on feet: Secondary | ICD-10-CM

## 2022-01-06 NOTE — Therapy (Signed)
OUTPATIENT PHYSICAL THERAPY NEURO TREATMENT  Patient Name: Sherry [name] MRN: [id number] DOB:[birth year], 87 y.o., female Today's Date: 01/07/2022   PCP: Dewayne Shorter, MD REFERRING PROVIDER: Earnestine Leys   PT End of Session - 01/06/22 1156     Visit Number 18    Number of Visits 24    Date for PT Re-Evaluation 01/11/22    Authorization Time Period Initial cert [id number]    Progress Note Due on Visit 20    PT Start Time 7116    PT Stop Time 1226    PT Time Calculation (min) 41 min    Equipment Utilized During Treatment Gait belt    Activity Tolerance Patient tolerated treatment well    Behavior During Therapy WFL for tasks assessed/performed                    Past Medical History:  Diagnosis Date   Acute blood loss anemia    Arthritis    Cancer ([place])    skin   Central cord syndrome at C6 level of cervical spinal cord ([place]) 11/29/2017   Hypertension    Protein-calorie malnutrition, severe ([place]) 01/24/2018   S/P insertion of IVC (inferior vena caval) filter 01/24/2018   Tetraparesis ([place])    Past Surgical History:  Procedure Laterality Date   ANTERIOR CERVICAL DECOMP/DISCECTOMY FUSION N/A 11/29/2017   Procedure: Cervical five-six, six-seven Anterior Cervical Decompression Fusion;  Surgeon: Judith Part, MD;  Location: [place];  Service: Neurosurgery;  Laterality: N/A;  Cervical five-six, six-seven Anterior Cervical Decompression Fusion   CATARACT EXTRACTION     FEMUR IM NAIL Left 08/16/2021   Procedure: INTRAMEDULLARY (IM) NAIL FEMORAL;  Surgeon: Earnestine Leys, MD;  Location: [facility];  Service: Orthopedics;  Laterality: Left;   IR IVC FILTER PLMT / S&I /IMG GUID/MOD SED  12/08/2017   Patient Active Problem List   Diagnosis Date Noted   Closed hip fracture ([place]) 08/15/2021   DVT (deep venous thrombosis) ([place]) 08/15/2021   Quadriplegia ([place]) 08/15/2021   Fall 08/15/2021   Constipation due to slow transit 08/31/2018   Trauma  06/05/2018   Neuropathic pain 06/05/2018   Neurogenic bowel 06/05/2018   Vaginal yeast infection 01/30/2018   Healthcare-associated pneumonia 01/25/2018   Chronic allergic rhinitis 01/24/2018   Depression with anxiety 01/24/2018   UTI due to Klebsiella species 01/24/2018   Protein-calorie malnutrition, severe ([place]) 01/24/2018   S/P insertion of IVC (inferior vena caval) filter 01/24/2018   Tetraparesis ([place]) 01/20/2018   Neurogenic bladder 01/20/2018   Reactive depression    Benign essential HTN    Acute postoperative anemia due to expected blood loss    Central cord syndrome at C6 level of cervical spinal cord ([place]) 11/29/2017   Allergy to alpha-gal 11/25/2016   SCC (squamous cell carcinoma) 04/08/2014    ONSET DATE: 08/15/2021 (fall with B hip fx); Initial injury was in 2019- quadraplegia due to central cord syndrom of C6 secondary to neck fracture.   REFERRING DIAG: Bilateral Hip fx; Left Open- s/p Left IM nail femoral on 08/16/2021; Right closed  THERAPY DIAG:  Muscle weakness (generalized)  Other lack of coordination  Abnormality of gait and mobility  Difficulty in walking, not elsewhere classified  Other abnormalities of gait and mobility  Closed fracture of both hips, initial encounter ([place])  Unsteadiness on feet  Rationale for Evaluation and Treatment Rehabilitation  SUBJECTIVE:  SUBJECTIVE STATEMENT:    Patient reports no changes since last visit. Reports doing well with no obvious soreness from increased resistance with strengthening.     Pt accompanied by:  Paid caregiver-   PERTINENT HISTORY: Patient was previous patient (see previous episode for specifics) She experienced a fall at home on 08/15/2021 and was in [facility] from 08/15/2021- 08/20/2021.  Most recent history  per Dr. Laurey Arrow on 08/20/2021.  Sherry [name] is a 87 y.o. 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   Patient has history of quadriplegia, and has been doing PT/OT with improvement of function. Pt has some use of both arms and can feed herself. She can walk a little bit with assistance. Pt accidentally fell at about 930 this morning after taking shower.  She injured her hips. Hospital Course:    Closed left hip fracture [facility]) s/p mechanical fall Closed right hip fracture (?old) Acute blood loss anemia --pt is s/p  INTRAMEDULLARY (IM) NAIL FEMORAL-left on 7/16  PAIN:  Are you having pain? NO: NPRS scale: 0/10 Pain location: Right hip Pain description: Ache Aggravating factors: any active Movement of right side Relieving factors: Rest  PRECAUTIONS: Fall  WEIGHT BEARING RESTRICTIONS Yes Bilateral non-weight bearing B LE's  FALLS: Has patient fallen in last 6 months? Yes. Number of falls 1  LIVING ENVIRONMENT: Lives with: lives with their family and lives in a nursing home Lives in: Other Long term care at [place]: No Has following equipment at home: Wheelchair (power), Ramped entry, and hoyer lift  PLOF: Needs assistance with ADLs, Needs assistance with homemaking, Needs assistance with gait, Needs assistance with transfers, and Leisure: Work puzzles, read, Watch TV  PATIENT GOALS Patient goal is to walk; Be able to strengthen my arms for better use  OBJECTIVE:   DIAGNOSTIC FINDINGS: Overall cognitive statusEXAM: DG HIP (WITH OR WITHOUT PELVIS) 2-3V LEFT; DG HIP (WITH OR WITHOUT PELVIS) 2-3V RIGHT   COMPARISON:  CT of the abdomen and pelvis 01/21/2020   FINDINGS: Left proximal femur fracture noted just below the  intertrochanteric region. Marked ventral angulation noted on the cross-table image. Slight varus angulation present   Comminuted intratrochanteric fracture present on the right also with marked ventral angulation.   IMPRESSION: 1. Bilateral hip fractures. 2. Comminuted intertrochanteric fracture of the right hip. 3. Angulated fracture of the proximal left femur just below the intertrochanteric region.     Electronically Signed   By: San Morelle M.D.   On: 08/15/2021 11:54:   COGNITION:  Within functional limits for tasks assessed   SENSATION: Light touch: Impaired   EDEMA:  None  Strength R/L 2-/2- Shoulder flexion (anterior deltoid/pec major/coracobrachialis, axillary n. (C5-6) and musculocutaneous n. (C5-7)) 2-/3+ (within available ROM around 130 on lef)Shoulder abduction (deltoid/supraspinatus, axillary/suprascapular n, C5) 3+/3+ Shoulder external rotation (infraspinatus/teres minor) 4/4 Shoulder internal rotation (subcapularis/lats/pec major) 3+/3+ Shoulder extension (posterior deltoid, lats, teres major, axillary/thoracodorsal n.) 3+/3+ Shoulder horizontal abduction 4/4 Elbow flexion (biceps brachii, brachialis, brachioradialis, musculoskeletal n, C5-6) 4/4 Elbow extension (triceps, radial n, C7) 2-/2- Wrist Extension 3+/3+ Wrist Flexion   LOWER EXTREMITY MMT:    MMT Right Eval Left Eval  Hip flexion    Hip extension    Hip abduction    Hip adduction    Hip internal rotation  Hip external rotation    Knee flexion    Knee extension    Ankle dorsiflexion    Ankle plantarflexion    Ankle inversion    Ankle eversion    (Blank rows = not tested) - BLE not tested secondary to weight bearing precaution of B LE NWB  BED MOBILITY:  Unable to test due to NWB status of BLE- Patient currently dependent with all bed mobility using hoyer and caregiver assist  TRANSFERS: Currently BLE NWB- Dependent on use of hoyer for all transfers  FUNCTIONAL  TESTs:   PATIENT SURVEYS:  FOTO 12  TODAY'S TREATMENT:  01/06/2022  -COPIED FROM [facility]-DR. H. MILLER note from 12/30/2021 12/30/2021 12/30/2021 Assessment and Plan:  Impression: 1. Bilateral hip fractures.  Recommendation: The patient will continue the current program until 02/01/2022 when she may try some weightbearing exercises as well. The patient will follow up in 03/2022 for exam and x-ray of both hips.  ATTESTATION:  This note has been generated by Romero Liner and edited by Christiana Fuchs, Quality Documentation Specialist.     - COPIED FROM [facility]- DR. Jonny Ruiz Note from 11/18/2021:  11/18/2021 11/18/2021 Assessment and Plan:  Bilateral hip fractures.   Recommendation:  The patient's PT may start with range of motion and more activity with the hips. I would like to avoid weightbearing still. We will see her back here in 6 weeks for an exam and x-ray of both hips.    THEREX: In power w/c (due to B NWB LE status)   -Gluteal sets with 5 sec hold x 15 reps (seated position)  -AAROM Hip flex  x 15 reps each LE; increased to 2lb x 10 reps on 2nd set  -PROM stretch to B hamstrings/gastroc region --Hip abduction -AAROM 2 sets of 15 reps each LE -Hip adduction with ball squeeze hold 5 sec x 15 reps each.  -Seated LAQ- pyramid- left LE    12 reps 2lb weight  10 reps 2.5 lb weight  8 reps 3lb weight  8 reps 3lb weight -Seated LAQ on right - 2 sets of 12 reps and last set with eccentric hold x 5 more reps.  -assisted heel slide 2 sets of 12 reps- no pain in B hips       PATIENT EDUCATION: Education details: PT plan of care/use of resistive bands Person educated: Patient Education method: Explanation, Demonstration, Tactile cues, and Verbal cues Education comprehension: verbalized understanding, returned demonstration, verbal cues required, tactile cues required, and needs further education   HOME EXERCISE PROGRAM: Discussed use of theraband and demo  shoulder flex/abd and elbow flex and ext with use of yellow TB. Patient required assist to position therband but able to perform well with only one person assist  Access Code: [email or web address] URL: [email or web address] Date: 11/23/2021 Prepared by: Janna Arch Exercises - Seated Gluteal Sets - 2 x daily - 7 x weekly - 2 sets - 10 reps - 5 hold - Seated Quad Set - 2 x daily - 7 x weekly - 2 sets - 10 reps - 5 hold - Seated Long Arc Quad - 2 x daily - 7 x weekly - 2 sets - 10 reps - 5 hold - Seated March - 2 x daily - 7 x weekly - 2 sets - 10 reps - 5 hold - Seated Hip Abduction - 2 x daily - 7 x weekly - 2 sets - 10 reps - 5 hold - Seated Shoulder Shrugs - 2 x daily - 7  x weekly - 2 sets - 10 reps - 5 hold - Wheelchair Pressure Relief - 2 x daily - 7 x weekly - 2 sets - 10 reps - 5 hold  GOALS: Goals reviewed with patient? Yes  SHORT TERM GOALS: Target date: 02/18/2022  Pt will be independent with initial UE strengthening HEP in order to improve strength and balance in order to decrease fall risk and improve function at home and work. Baseline: 10/19/2021- patient with no formal UE HEP; 12/02/2021= Patient verbalized knowledge of HEP including use of theraband for UE strengthening.  Goal status: GOAL MET  LONG TERM GOALS: Target date: 04/01/2022  Pt will be independent with final for UE/LE HEP in order to improve strength and balance in order to decrease fall risk and improve function at home and work. Baseline: Patient is currently BLE NWB and unable to participate in HEP. Has order for UE strengthening. Goal status: INITIAL  2.  Pt will improve FOTO to target score of 35  to display perceived improvements in ability to complete ADL's.  Baseline: 10/19/2021= 12; 12/02/2021= 17 Goal status: PROGRESSING  3.  Pt will increase strength of B UE  by at least 1/2 MMT grade in order to demonstrate improvement in strength and function  Baseline: patient has range of 2-/5 to 4/5 BUE Strength;  12/02/2021= 4/5 except with wrist ext.  Goal status: PROGRESSING  ASSESSMENT:  CLINICAL IMPRESSION: Patient presents with good motivation for today's session. She continued with progressive seated LE strengthening exhibiting increased fatigue with right LE overall today- not able to maintain eccentric control quite as well. She continues to progress Left LE well with pyramid strengthening. Pt will continue to benefit from skilled PT services to improve strength in anticipation for BLE clearance for [place] activities.   OBJECTIVE IMPAIRMENTS Abnormal gait, decreased activity tolerance, decreased balance, decreased coordination, decreased endurance, decreased mobility, difficulty walking, decreased ROM, decreased strength, hypomobility, impaired flexibility, impaired UE functional use, postural dysfunction, and pain.   ACTIVITY LIMITATIONS carrying, lifting, bending, sitting, standing, squatting, sleeping, stairs, transfers, bed mobility, continence, bathing, toileting, dressing, self feeding, reach over head, hygiene/grooming, and caring for others  PARTICIPATION LIMITATIONS: meal prep, cleaning, laundry, medication management, personal finances, interpersonal relationship, driving, shopping, community activity, and yard work  PERSONAL FACTORS Age, Time since onset of injury/illness/exacerbation, and 1-2 comorbidities: HTN, cervical Sx  are also affecting patient's functional outcome.   REHAB POTENTIAL: Good  CLINICAL DECISION MAKING: Evolving/moderate complexity  EVALUATION COMPLEXITY: Moderate  PLAN: PT FREQUENCY: 2x/week  PT DURATION: 12 weeks  PLANNED INTERVENTIONS: Therapeutic exercises, Therapeutic activity, Neuromuscular re-education, Balance training, Gait training, Patient/Family education, Self Care, Joint mobilization, DME instructions, Dry Needling, Electrical stimulation, Wheelchair mobility training, Spinal mobilization, Cryotherapy, Moist heat, and Manual therapy  PLAN FOR  NEXT SESSION: Instruct in home program for UE strengthening  Ollen Bowl, PT 9:14 AM,01/07/22  Physical Therapist- [facility]  01/07/2022, 9:14 AM

## 2022-01-06 NOTE — Therapy (Signed)
OCCUPATIONAL THERAPY TREATMENT NOTE    Patient Name: Sherry [name] MRN: [id number] DOB:05-Sep-1934, 87 y.o., female Today's Date: 01/06/2022                                         PCP: Dr. Viviana Simpler REFERRING PROVIDER: Dr. Viviana Simpler   OT End of Session - 01/06/22 1151     Visit Number 25    Number of Visits 48    Date for OT Re-Evaluation 03/03/22    OT Start Time 1100    OT Stop Time 1145    OT Time Calculation (min) 45 min    Activity Tolerance Patient tolerated treatment well    Behavior During Therapy [facility] for tasks assessed/performed                 Past Medical History:  Diagnosis Date   Acute blood loss anemia    Arthritis    Cancer ([place])    skin   Central cord syndrome at C6 level of cervical spinal cord ([place]) 11/29/2017   Hypertension    Protein-calorie malnutrition, severe ([place]) 01/24/2018   S/P insertion of IVC (inferior vena caval) filter 01/24/2018   Tetraparesis ([place])    Past Surgical History:  Procedure Laterality Date   ANTERIOR CERVICAL DECOMP/DISCECTOMY FUSION N/A 11/29/2017   Procedure: Cervical five-six, six-seven Anterior Cervical Decompression Fusion;  Surgeon: Judith Part, MD;  Location: [place];  Service: Neurosurgery;  Laterality: N/A;  Cervical five-six, six-seven Anterior Cervical Decompression Fusion   CATARACT EXTRACTION     FEMUR IM NAIL Left 08/16/2021   Procedure: INTRAMEDULLARY (IM) NAIL FEMORAL;  Surgeon: Earnestine Leys, MD;  Location: [facility];  Service: Orthopedics;  Laterality: Left;   IR IVC FILTER PLMT / S&I /IMG GUID/MOD SED  12/08/2017   Patient Active Problem List   Diagnosis Date Noted   Closed hip fracture ([place]) 08/15/2021   DVT (deep venous thrombosis) ([place]) 08/15/2021   Quadriplegia ([place]) 08/15/2021   Fall 08/15/2021   Constipation due to slow transit 08/31/2018   Trauma 06/05/2018   Neuropathic pain 06/05/2018   Neurogenic bowel 06/05/2018   Vaginal yeast infection 01/30/2018    Healthcare-associated pneumonia 01/25/2018   Chronic allergic rhinitis 01/24/2018   Depression with anxiety 01/24/2018   UTI due to Klebsiella species 01/24/2018   Protein-calorie malnutrition, severe ([place]) 01/24/2018   S/P insertion of IVC (inferior vena caval) filter 01/24/2018   Tetraparesis ([place]) 01/20/2018   Neurogenic bladder 01/20/2018   Reactive depression    Benign essential HTN    Acute postoperative anemia due to expected blood loss    Central cord syndrome at C6 level of cervical spinal cord ([place]) 11/29/2017   Allergy to alpha-gal 11/25/2016   SCC (squamous cell carcinoma) 04/08/2014    REFERRING DIAG: Central Cord Syndrome at C6 of the Cervical Spinal Cord, Fall with Bilateral Closed Hip Fractures, with ORIF repair with Intramedullary Nailing of the Left Hip Fracture.  THERAPY DIAG:  Muscle weakness (generalized)  Rationale for Evaluation and Treatment Rehabilitation  PERTINENT HISTORY: Patient s/p fall November 29, 2017 resulting in diagnosis of central cord syndrome at C 6 level.  She has had therapy in multiple venues but with recent move to [facility], therapy staff recommended she seek outpatient therapy for her needs. Pt. Had a recent fall with Bilateral Closed Hip Fractures, with ORIF repair with Intramedullary Nailing of the Left  Hip Fracture.   PRECAUTIONS: None  SUBJECTIVE:   Pt. reports doing well today.  PAIN:  Are you having pain? No     OBJECTIVE:   TODAY'S TREATMENT:   Manual Therapy:   Pt. tolerated soft tissue massage to the volar, and dorsal surface of each digit on the bilateral hands secondary to stiffness. Manual therapy was performed independent of, and in preparation for therapeutic Ex.   Therapeutic Ex.    Pt. worked on [employer], and reciprocal motion using the UBE while seated for 8 min. with minimal resistance. Constant monitoring was provided. Pt. performed bilateral UE strengthening for bilateral shoulder flexion, chest  presses, and elbow flexion, and extension. Pt. worked on bilateral UE strengthening. Pt. tolerated PROM followed by AROM bilateral wrist extension, PROM for bilateral digit MP, PIP, and DIP flexion, and extension, thumb radial, and palmar abduction in conjunction with moist heat.      Pt. continues to progress with AROM in right shoulder flexion, and bilateral UE strength. Pt. continues to try to engage her bilateral hands more during daily ADL, and IADL tasks. Pt. Continues to tolerateThere. Ex. On the UBE, and manual therapy well today.  Pt. Continues to be able to reach up in to her closet to access her jacket, and donn it over both arms in reverse position with the back facing the front. Pt. Continues to be able to brush her teeth with assist for set-up, Pt. Is independent with feeding herself more efficiently using standard utensils. Pt. is able to water the cactus in her husband's room while holding and using a cup. Pt. Is able to hold a coffee cup, and drink from it. Pt. reports writing more on her daily menus, however writing continues to appear "shaky". Pt. Continues to present with tightness at the bilateral thumb webspace, and bilateral wrist flexor tightness, as well as digit MP, PIP, and DIP extensor tightness which continues to limit her ability to complete ADL tasks efficiently. Pt. continues to benefit from working on impoving ROM in order to work towards increasing bilateral hand grasp on objects, and increase engagement of her bilateral hands during ADLs, and IADL tasks.   PATIENT EDUCATION: Education details: BUE ROM, hand function, UE functioning Person educated: Patient Education method: Explanation and Demonstration Education comprehension: verbalized understanding, returned demonstration, and needs further education   HOME EXERCISE PROGRAM Continue with ongoing HEPs  Measurements:  12/10/2022:  Shoulder flexion: R: 120(136), L: 138(150) Shoulder abduction: R: 108(120), L:  110(120) Elbow: R: 0-148, L: 0-146 Wrist flexion: R: 55(62), L: 78 Writ extension: R: 34(55), L: 3(4) RD: R: 12(20), L: 16(26) UD: R: 8(24), L: 6(18) Thumb radial abduction: R: 11(20), L: 8(20) Digit flexion to the [facility]: R:  2nd: 5cm(3.5cm), 3rd: 7.5 cm(5cm), 4th: 7cm(3cm), 5th: 6cm(5.5cm) L: 2nd: 8cm(8cm), 3rd: 8cm(8cm), 4th: 7.5cm(7cm), 5th: 7cm(7cm)            OT Long Term Goals - 07/06/21 1106                                                                                          Target Date: 03/03/2022    OT  LONG TERM GOAL #1   Title Patient and caregiver will demonstrate understanding of home exercise program for ROM.    Baseline 12/09/2021: Pt. Reports the restorative ROM program has not resumed at the facility. 10/28/2021: Pt. Is attempting exercises/stretches at home. The facility restorative aides have not resumed exercises. 09/14/2021:  Pt. Reports inconsistencies with restorative ROM at  her care home. Pt/caregiver education to be provided about ongoing ROM needs.    Time 12    Period Weeks    Status Deferred     OT LONG TERM GOAL #2   Title Pt.  Will improve BUE strength to be able to sustain her UE's in elevation to efficiently, and effectively perform hair care, and self-grooming tasks.   Baseline 12/09/2021: right shoulder 4-/5, left shoulder 4/5. Pt. Has improved to  be able to reach up for hair care, and self-grooming tasks. 10/28/2021: Right shoulder 3+/5, Left shoulder 4-/5 09/14/2021:  BUE shoulder strength: 3+/5 for shoulder flexion, and abduction.   Time 12    Period Weeks    Status Ongoing               OT LONG TERM GOAL #3   Title Patient will demonstrate improved composite finger flexion to hold deodorant to apply to underarms.    Baseline 12/09/2021: Bilateral hand/digit MP, PIP, and DIP extension tightness limits her ability achieve digit flexion to hold, and apply deodorant. 10/28/2021:  Pt. Continues to have difficulty holding the deodorant. 01/12/2021:  Pt. Presents with limited digit extension. Pt. Is able to initiate holding deodorant, however is unable to hold it while using it.   Time 12    Period Weeks    Status Ongoing   Target Date                  OT LONG TERM GOAL #4   Title Pt. Will improve bilateral wrist extension in preparation for anticipating, and initiating reaching for objects at the table.    Baseline 12/09/2021: Right  34(55)  Left  3(4) 10/28/2021:  Right 22(38), Left 0(15) 09/14/2021:  Right: 17(35), left 2(15)   Time 12   Period weeks   Status Ongoing   Target Date      OT LONG TERM GOAL #5   Title Pt. will write, and sign her name with 100% legibility, and modified independence.    Baseline 12/09/2021: Pt. Reports legibility appears "shaky" when attempting signing her name. Pt.reports that she is writing more when filling out her menu. 10/28/2021: pt. is able to sign her name, however legibility continues to be inconsistent. Pt. Reports "shaky appearance". 09/14/2021: Pt. continues to fill out her daily menu and complete puzzles. Pt. Continues to present with limited writing legibility.   Time 12    Period Weeks    Status  New          OT LONG TERM GOAL  #6   TITLE Pt. will turn pages in a book with modified independence.    Baseline 10/28/2021: Pt. Is able to consistently turn book pages. 09/14/2021: Pt. has difficulty turning pages.    Time 12    Period Weeks    Status Achieved       OT LONG TERM GOAL  #7  TITLE Pt. Will increase  bilateral lateral pinch strength by 2 lbs to be able to securely grasp items during ADLs, and IADL tasks.   Baseline 12/09/2021:  Right: 6#, Left: 4#   Time 12  Period Weeks  Status  New             Plan - 07/22/21 1737     Clinical Impression Statement Pt. continues to progress with AROM in right shoulder flexion, and bilateral UE strength. Pt. continues to try to engage her bilateral hands more during daily ADL, and IADL tasks. Pt. Continues to tolerateThere. Ex. On the  UBE, and manual therapy well today.  Pt. Continues to be able to reach up in to her closet to access her jacket, and donn it over both arms in reverse position with the back facing the front. Pt. Continues to be able to brush her teeth with assist for set-up, Pt. Is independent with feeding herself more efficiently using standard utensils. Pt. is able to water the cactus in her husband's room while holding and using a cup. Pt. Is able to hold a coffee cup, and drink from it. Pt. reports writing more on her daily menus, however writing continues to appear "shaky". Pt. Continues to present with tightness at the bilateral thumb webspace, and bilateral wrist flexor tightness, as well as digit MP, PIP, and DIP extensor tightness which continues to limit her ability to complete ADL tasks efficiently. Pt. continues to benefit from working on impoving ROM in order to work towards increasing bilateral hand grasp on objects, and increase engagement of her bilateral hands during ADLs, and IADL tasks.                 OT Occupational Profile and History Detailed Assessment- Review of Records and additional review of physical, cognitive, psychosocial history related to current functional performance    Occupational performance deficits (Please refer to evaluation for details): ADL's;IADL's;Leisure;Social Participation    Body Structure / Function / Physical Skills ADL;Continence;Dexterity;Flexibility;Strength;ROM;Balance;Coordination;FMC;IADL;Endurance;UE functional use;Decreased knowledge of use of DME;GMC    Psychosocial Skills Coping Strategies    Rehab Potential Fair    Clinical Decision Making Several treatment options, min-mod task modification necessary    Comorbidities Affecting Occupational Performance: Presence of comorbidities impacting occupational performance    Comorbidities impacting occupational performance description: contractures of bilateral hands, dependent transfers,    Modification or  Assistance to Complete Evaluation  No modification of tasks or assist necessary to complete eval    OT Frequency 2x / week    OT Duration 12 weeks    OT Treatment/Interventions Self-care/ADL training;Cryotherapy;Therapeutic exercise;DME and/or AE instruction;Balance training;Neuromuscular education;Manual Therapy;Splinting;Moist Heat;Passive range of motion;Therapeutic activities;Patient/family education    Plan continue to progress ROM of digits, wrists and shoulders as it pertains to completion of ADL tasks that pt values.    Consulted and Agree with Plan of Care Patient            Harrel Carina, MS, OTR/L  Harrel Carina, OT 01/06/2022, 12:05 PM      `

## 2022-01-11 ENCOUNTER — Ambulatory Visit: Payer: Medicare HMO

## 2022-01-11 ENCOUNTER — Ambulatory Visit: Payer: Medicare HMO | Admitting: Occupational Therapy

## 2022-01-11 DIAGNOSIS — M6281 Muscle weakness (generalized): Secondary | ICD-10-CM

## 2022-01-11 DIAGNOSIS — R2681 Unsteadiness on feet: Secondary | ICD-10-CM

## 2022-01-11 DIAGNOSIS — S14129S Central cord syndrome at unspecified level of cervical spinal cord, sequela: Secondary | ICD-10-CM

## 2022-01-11 DIAGNOSIS — R2689 Other abnormalities of gait and mobility: Secondary | ICD-10-CM

## 2022-01-11 DIAGNOSIS — R269 Unspecified abnormalities of gait and mobility: Secondary | ICD-10-CM

## 2022-01-11 DIAGNOSIS — R262 Difficulty in walking, not elsewhere classified: Secondary | ICD-10-CM

## 2022-01-11 DIAGNOSIS — S72001A Fracture of unspecified part of neck of right femur, initial encounter for closed fracture: Secondary | ICD-10-CM

## 2022-01-11 DIAGNOSIS — R278 Other lack of coordination: Secondary | ICD-10-CM

## 2022-01-11 NOTE — Therapy (Signed)
OCCUPATIONAL THERAPY TREATMENT NOTE    Patient Name: Sherry [name] MRN: [id number] DOB:Sep 18, 1934, 87 y.o., female Today's Date: 01/11/2022                                         PCP: Dr. Viviana Simpler REFERRING PROVIDER: Dr. Viviana Simpler   OT End of Session - 01/11/22 1219     Visit Number 26    Number of Visits 48    Date for OT Re-Evaluation 03/03/22    Authorization Time Period Progress report period starting 09/14/2021    OT Start Time 36    OT Stop Time 1145    OT Time Calculation (min) 45 min    Activity Tolerance Patient tolerated treatment well    Behavior During Therapy [facility] for tasks assessed/performed                 Past Medical History:  Diagnosis Date   Acute blood loss anemia    Arthritis    Cancer ([place])    skin   Central cord syndrome at C6 level of cervical spinal cord ([place]) 11/29/2017   Hypertension    Protein-calorie malnutrition, severe ([place]) 01/24/2018   S/P insertion of IVC (inferior vena caval) filter 01/24/2018   Tetraparesis ([place])    Past Surgical History:  Procedure Laterality Date   ANTERIOR CERVICAL DECOMP/DISCECTOMY FUSION N/A 11/29/2017   Procedure: Cervical five-six, six-seven Anterior Cervical Decompression Fusion;  Surgeon: Judith Part, MD;  Location: [place];  Service: Neurosurgery;  Laterality: N/A;  Cervical five-six, six-seven Anterior Cervical Decompression Fusion   CATARACT EXTRACTION     FEMUR IM NAIL Left 08/16/2021   Procedure: INTRAMEDULLARY (IM) NAIL FEMORAL;  Surgeon: Earnestine Leys, MD;  Location: [facility];  Service: Orthopedics;  Laterality: Left;   IR IVC FILTER PLMT / S&I /IMG GUID/MOD SED  12/08/2017   Patient Active Problem List   Diagnosis Date Noted   Closed hip fracture ([place]) 08/15/2021   DVT (deep venous thrombosis) ([place]) 08/15/2021   Quadriplegia ([place]) 08/15/2021   Fall 08/15/2021   Constipation due to slow transit 08/31/2018   Trauma 06/05/2018   Neuropathic pain 06/05/2018   Neurogenic  bowel 06/05/2018   Vaginal yeast infection 01/30/2018   Healthcare-associated pneumonia 01/25/2018   Chronic allergic rhinitis 01/24/2018   Depression with anxiety 01/24/2018   UTI due to Klebsiella species 01/24/2018   Protein-calorie malnutrition, severe ([place]) 01/24/2018   S/P insertion of IVC (inferior vena caval) filter 01/24/2018   Tetraparesis ([place]) 01/20/2018   Neurogenic bladder 01/20/2018   Reactive depression    Benign essential HTN    Acute postoperative anemia due to expected blood loss    Central cord syndrome at C6 level of cervical spinal cord ([place]) 11/29/2017   Allergy to alpha-gal 11/25/2016   SCC (squamous cell carcinoma) 04/08/2014    REFERRING DIAG: Central Cord Syndrome at C6 of the Cervical Spinal Cord, Fall with Bilateral Closed Hip Fractures, with ORIF repair with Intramedullary Nailing of the Left Hip Fracture.  THERAPY DIAG:  Muscle weakness (generalized)  Rationale for Evaluation and Treatment Rehabilitation  PERTINENT HISTORY: Patient s/p fall November 29, 2017 resulting in diagnosis of central cord syndrome at C 6 level.  She has had therapy in multiple venues but with recent move to [facility], therapy staff recommended she seek outpatient therapy for her needs. Pt. Had a recent fall with Bilateral Closed  Hip Fractures, with ORIF repair with Intramedullary Nailing of the Left Hip Fracture.   PRECAUTIONS: None  SUBJECTIVE:   Pt. reports doing well today.  PAIN:  Are you having pain? No     OBJECTIVE:   TODAY'S TREATMENT:   Manual Therapy:   Pt. tolerated soft tissue massage to the volar, and dorsal surface of each digit on the bilateral hands secondary to stiffness. Manual therapy was performed independent of, and in preparation for therapeutic Ex.   Therapeutic Ex.    Pt. worked on [employer], and reciprocal motion using the UBE while seated for 8 min. with minimal resistance. Constant monitoring was provided. Pt. performed bilateral  UE strengthening for bilateral shoulder flexion, chest presses, and elbow flexion, and extension. Pt. worked on bilateral UE strengthening. Pt. tolerated PROM followed by AROM bilateral wrist extension, PROM for bilateral digit MP, PIP, and DIP flexion, and extension, thumb radial, and palmar abduction. Pt. performed 1.5# dowel ex. For UE strengthening secondary to weakness. Bilateral shoulder flexion, and chest presses were performed. Pt. Performed triceps exercises with yellow theraband for 2 sets 10 reps each.       Pt. continues to progress with AROM in right shoulder flexion, and bilateral UE strength. Pt. continues to try to engage her bilateral hands more during daily ADL, and IADL tasks. Pt. Continues to tolerateThere. Ex. On the UBE, and manual therapy well today.  Pt. Tolerated BUE strengthening exercises with the dowel, and yellow resistive theraband. Pt. Continues to be able to reach up in to her closet to access her jacket, and donn it over both arms in reverse position with the back facing the front. Pt. Continues to be able to brush her teeth with assist for set-up, Pt. Is independent with feeding herself more efficiently using standard utensils. Pt. is able to water the cactus in her husband's room while holding and using a cup. Pt. Is able to hold a coffee cup, and drink from it. Pt. reports writing more on her daily menus, however writing continues to appear "shaky". Pt. Continues to present with tightness at the bilateral thumb webspace, and bilateral wrist flexor tightness, as well as digit MP, PIP, and DIP extensor tightness which continues to limit her ability to complete ADL tasks efficiently. Pt. continues to benefit from working on impoving ROM in order to work towards increasing bilateral hand grasp on objects, and increase engagement of her bilateral hands during ADLs, and IADL tasks.   PATIENT EDUCATION: Education details: BUE ROM, hand function, UE functioning Person educated:  Patient Education method: Explanation and Demonstration Education comprehension: verbalized understanding, returned demonstration, and needs further education   HOME EXERCISE PROGRAM Continue with ongoing HEPs  Measurements:  12/10/2022:  Shoulder flexion: R: 120(136), L: 138(150) Shoulder abduction: R: 108(120), L: 110(120) Elbow: R: 0-148, L: 0-146 Wrist flexion: R: 55(62), L: 78 Writ extension: R: 34(55), L: 3(4) RD: R: 12(20), L: 16(26) UD: R: 8(24), L: 6(18) Thumb radial abduction: R: 11(20), L: 8(20) Digit flexion to the [place]: R:  2nd: 5cm(3.5cm), 3rd: 7.5 cm(5cm), 4th: 7cm(3cm), 5th: 6cm(5.5cm) L: 2nd: 8cm(8cm), 3rd: 8cm(8cm), 4th: 7.5cm(7cm), 5th: 7cm(7cm)            OT Long Term Goals - 07/06/21 1106  Target Date: 03/03/2022    OT LONG TERM GOAL #1   Title Patient and caregiver will demonstrate understanding of home exercise program for ROM.    Baseline 12/09/2021: Pt. Reports the restorative ROM program has not resumed at the facility. 10/28/2021: Pt. Is attempting exercises/stretches at home. The facility restorative aides have not resumed exercises. 09/14/2021:  Pt. Reports inconsistencies with restorative ROM at  her care home. Pt/caregiver education to be provided about ongoing ROM needs.    Time 12    Period Weeks    Status Deferred     OT LONG TERM GOAL #2   Title Pt.  Will improve BUE strength to be able to sustain her UE's in elevation to efficiently, and effectively perform hair care, and self-grooming tasks.   Baseline 12/09/2021: right shoulder 4-/5, left shoulder 4/5. Pt. Has improved to  be able to reach up for hair care, and self-grooming tasks. 10/28/2021: Right shoulder 3+/5, Left shoulder 4-/5 09/14/2021:  BUE shoulder strength: 3+/5 for shoulder flexion, and abduction.   Time 12    Period Weeks    Status Ongoing               OT LONG TERM GOAL #3   Title  Patient will demonstrate improved composite finger flexion to hold deodorant to apply to underarms.    Baseline 12/09/2021: Bilateral hand/digit MP, PIP, and DIP extension tightness limits her ability achieve digit flexion to hold, and apply deodorant. 10/28/2021:  Pt. Continues to have difficulty holding the deodorant. 01/12/2021: Pt. Presents with limited digit extension. Pt. Is able to initiate holding deodorant, however is unable to hold it while using it.   Time 12    Period Weeks    Status Ongoing   Target Date                  OT LONG TERM GOAL #4   Title Pt. Will improve bilateral wrist extension in preparation for anticipating, and initiating reaching for objects at the table.    Baseline 12/09/2021: Right  34(55)  Left  3(4) 10/28/2021:  Right 22(38), Left 0(15) 09/14/2021:  Right: 17(35), left 2(15)   Time 12   Period weeks   Status Ongoing   Target Date      OT LONG TERM GOAL #5   Title Pt. will write, and sign her name with 100% legibility, and modified independence.    Baseline 12/09/2021: Pt. Reports legibility appears "shaky" when attempting signing her name. Pt.reports that she is writing more when filling out her menu. 10/28/2021: pt. is able to sign her name, however legibility continues to be inconsistent. Pt. Reports "shaky appearance". 09/14/2021: Pt. continues to fill out her daily menu and complete puzzles. Pt. Continues to present with limited writing legibility.   Time 12    Period Weeks    Status  New          OT LONG TERM GOAL  #6   TITLE Pt. will turn pages in a book with modified independence.    Baseline 10/28/2021: Pt. Is able to consistently turn book pages. 09/14/2021: Pt. has difficulty turning pages.    Time 12    Period Weeks    Status Achieved       OT LONG TERM GOAL  #7  TITLE Pt. Will increase  bilateral lateral pinch strength by 2 lbs to be able to securely grasp items during ADLs, and IADL tasks.   Baseline 12/09/2021:  Right: 6#, Left: 4#  Time 12  Period Weeks  Status New             Plan - 07/22/21 1737     Clinical Impression Statement Pt. continues to progress with AROM in right shoulder flexion, and bilateral UE strength. Pt. continues to try to engage her bilateral hands more during daily ADL, and IADL tasks. Pt. Continues to tolerateThere. Ex. On the UBE, and manual therapy well today.  Pt. Tolerated BUE strengthening exercises with the dowel, and yellow resistive theraband. Pt. Continues to be able to reach up in to her closet to access her jacket, and donn it over both arms in reverse position with the back facing the front. Pt. Continues to be able to brush her teeth with assist for set-up, Pt. Is independent with feeding herself more efficiently using standard utensils. Pt. is able to water the cactus in her husband's room while holding and using a cup. Pt. Is able to hold a coffee cup, and drink from it. Pt. reports writing more on her daily menus, however writing continues to appear "shaky". Pt. Continues to present with tightness at the bilateral thumb webspace, and bilateral wrist flexor tightness, as well as digit MP, PIP, and DIP extensor tightness which continues to limit her ability to complete ADL tasks efficiently. Pt. continues to benefit from working on impoving ROM in order to work towards increasing bilateral hand grasp on objects, and increase engagement of her bilateral hands during ADLs, and IADL tasks.                 OT Occupational Profile and History Detailed Assessment- Review of Records and additional review of physical, cognitive, psychosocial history related to current functional performance    Occupational performance deficits (Please refer to evaluation for details): ADL's;IADL's;Leisure;Social Participation    Body Structure / Function / Physical Skills ADL;Continence;Dexterity;Flexibility;Strength;ROM;Balance;Coordination;FMC;IADL;Endurance;UE functional use;Decreased knowledge of use of  DME;[facility]    Psychosocial Skills Coping Strategies    Rehab Potential Fair    Clinical Decision Making Several treatment options, min-mod task modification necessary    Comorbidities Affecting Occupational Performance: Presence of comorbidities impacting occupational performance    Comorbidities impacting occupational performance description: contractures of bilateral hands, dependent transfers,    Modification or Assistance to Complete Evaluation  No modification of tasks or assist necessary to complete eval    OT Frequency 2x / week    OT Duration 12 weeks    OT Treatment/Interventions Self-care/ADL training;Cryotherapy;Therapeutic exercise;DME and/or AE instruction;Balance training;Neuromuscular education;Manual Therapy;Splinting;Moist Heat;Passive range of motion;Therapeutic activities;Patient/family education    Plan continue to progress ROM of digits, wrists and shoulders as it pertains to completion of ADL tasks that pt values.    Consulted and Agree with Plan of Care Patient            Harrel Carina, MS, OTR/L  Harrel Carina, OT 01/11/2022, 12:21 PM      `

## 2022-01-11 NOTE — Therapy (Signed)
OUTPATIENT PHYSICAL THERAPY NEURO TREATMENT/RECERTIFICATION    Patient Name: Sherry [name] MRN: [id number] DOB:1934/09/04, 87 y.o., female Today's Date: 01/11/2022   PCP: Dewayne Shorter, MD REFERRING PROVIDER: Earnestine Leys   PT End of Session - 01/11/22 1101     Visit Number 19    Number of Visits 24    Date for PT Re-Evaluation 04/05/22    Authorization Time Period Initial cert 7/82/9562-13/09/6576; Recert 46/96/2952-09/05/1322    Progress Note Due on Visit 20    PT Start Time 1145    PT Stop Time 1220    PT Time Calculation (min) 35 min    Equipment Utilized During Treatment Gait belt    Activity Tolerance Patient tolerated treatment well    Behavior During Therapy WFL for tasks assessed/performed                     Past Medical History:  Diagnosis Date   Acute blood loss anemia    Arthritis    Cancer ([place])    skin   Central cord syndrome at C6 level of cervical spinal cord ([place]) 11/29/2017   Hypertension    Protein-calorie malnutrition, severe ([place]) 01/24/2018   S/P insertion of IVC (inferior vena caval) filter 01/24/2018   Tetraparesis ([place])    Past Surgical History:  Procedure Laterality Date   ANTERIOR CERVICAL DECOMP/DISCECTOMY FUSION N/A 11/29/2017   Procedure: Cervical five-six, six-seven Anterior Cervical Decompression Fusion;  Surgeon: Judith Part, MD;  Location: [facility];  Service: Neurosurgery;  Laterality: N/A;  Cervical five-six, six-seven Anterior Cervical Decompression Fusion   CATARACT EXTRACTION     FEMUR IM NAIL Left 08/16/2021   Procedure: INTRAMEDULLARY (IM) NAIL FEMORAL;  Surgeon: Earnestine Leys, MD;  Location: [facility];  Service: Orthopedics;  Laterality: Left;   IR IVC FILTER PLMT / S&I /IMG GUID/MOD SED  12/08/2017   Patient Active Problem List   Diagnosis Date Noted   Closed hip fracture ([place]) 08/15/2021   DVT (deep venous thrombosis) ([place]) 08/15/2021   Quadriplegia ([place]) 08/15/2021   Fall 08/15/2021    Constipation due to slow transit 08/31/2018   Trauma 06/05/2018   Neuropathic pain 06/05/2018   Neurogenic bowel 06/05/2018   Vaginal yeast infection 01/30/2018   Healthcare-associated pneumonia 01/25/2018   Chronic allergic rhinitis 01/24/2018   Depression with anxiety 01/24/2018   UTI due to Klebsiella species 01/24/2018   Protein-calorie malnutrition, severe ([place]) 01/24/2018   S/P insertion of IVC (inferior vena caval) filter 01/24/2018   Tetraparesis ([place]) 01/20/2018   Neurogenic bladder 01/20/2018   Reactive depression    Benign essential HTN    Acute postoperative anemia due to expected blood loss    Central cord syndrome at C6 level of cervical spinal cord ([place]) 11/29/2017   Allergy to alpha-gal 11/25/2016   SCC (squamous cell carcinoma) 04/08/2014    ONSET DATE: 08/15/2021 (fall with B hip fx); Initial injury was in 2019- quadraplegia due to central cord syndrom of C6 secondary to neck fracture.   REFERRING DIAG: Bilateral Hip fx; Left Open- s/p Left IM nail femoral on 08/16/2021; Right closed  THERAPY DIAG:  Muscle weakness (generalized)  Other lack of coordination  Abnormality of gait and mobility  Difficulty in walking, not elsewhere classified  Other abnormalities of gait and mobility  Closed fracture of both hips, initial encounter ([place])  Unsteadiness on feet  Central cord syndrome, sequela ([place])  Rationale for Evaluation and Treatment Rehabilitation  SUBJECTIVE:  SUBJECTIVE STATEMENT:    Patient reports having a good weekend and no new issues since last visit- Denies any LE pain with mobility or at rest.      Pt accompanied by:  Paid caregiver-   PERTINENT HISTORY: Patient was previous patient (see previous episode for specifics) She experienced a fall at home on  08/15/2021 and was in [facility] from 08/15/2021- 08/20/2021.  Most recent history per Dr. Laurey Arrow on 08/20/2021.  Sherry [name] is a 87 y.o. 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   Patient has history of quadriplegia, and has been doing PT/OT with improvement of function. Pt has some use of both arms and can feed herself. She can walk a little bit with assistance. Pt accidentally fell at about 930 this morning after taking shower.  She injured her hips. Hospital Course:    Closed left hip fracture [facility]) s/p mechanical fall Closed right hip fracture (?old) Acute blood loss anemia --pt is s/p  INTRAMEDULLARY (IM) NAIL FEMORAL-left on 7/16  PAIN:  Are you having pain? NO: NPRS scale: 0/10 Pain location: Right hip Pain description: Ache Aggravating factors: any active Movement of right side Relieving factors: Rest  PRECAUTIONS: Fall  WEIGHT BEARING RESTRICTIONS Yes Bilateral non-weight bearing B LE's  FALLS: Has patient fallen in last 6 months? Yes. Number of falls 1  LIVING ENVIRONMENT: Lives with: lives with their family and lives in a nursing home Lives in: Other Long term care at [place]: No Has following equipment at home: Wheelchair (power), Ramped entry, and hoyer lift  PLOF: Needs assistance with ADLs, Needs assistance with homemaking, Needs assistance with gait, Needs assistance with transfers, and Leisure: Work puzzles, read, Watch TV  PATIENT GOALS Patient goal is to walk; Be able to strengthen my arms for better use  OBJECTIVE:   DIAGNOSTIC FINDINGS: Overall cognitive statusEXAM: DG HIP (WITH OR WITHOUT PELVIS) 2-3V LEFT; DG HIP (WITH OR WITHOUT PELVIS) 2-3V RIGHT   COMPARISON:  CT of the abdomen and pelvis 01/21/2020    FINDINGS: Left proximal femur fracture noted just below the intertrochanteric region. Marked ventral angulation noted on the cross-table image. Slight varus angulation present   Comminuted intratrochanteric fracture present on the right also with marked ventral angulation.   IMPRESSION: 1. Bilateral hip fractures. 2. Comminuted intertrochanteric fracture of the right hip. 3. Angulated fracture of the proximal left femur just below the intertrochanteric region.     Electronically Signed   By: San Morelle M.D.   On: 08/15/2021 11:54:   COGNITION:  Within functional limits for tasks assessed   SENSATION: Light touch: Impaired   EDEMA:  None  Strength R/L 2-/2- Shoulder flexion (anterior deltoid/pec major/coracobrachialis, axillary n. (C5-6) and musculocutaneous n. (C5-7)) 2-/3+ (within available ROM around 130 on lef)Shoulder abduction (deltoid/supraspinatus, axillary/suprascapular n, C5) 3+/3+ Shoulder external rotation (infraspinatus/teres minor) 4/4 Shoulder internal rotation (subcapularis/lats/pec major) 3+/3+ Shoulder extension (posterior deltoid, lats, teres major, axillary/thoracodorsal n.) 3+/3+ Shoulder horizontal abduction 4/4 Elbow flexion (biceps brachii, brachialis, brachioradialis, musculoskeletal n, C5-6) 4/4 Elbow extension (triceps, radial n, C7) 2-/2- Wrist Extension 3+/3+ Wrist Flexion   LOWER EXTREMITY MMT:    MMT Right Eval Left Eval  Hip flexion    Hip extension    Hip abduction    Hip adduction    Hip  internal rotation    Hip external rotation    Knee flexion    Knee extension    Ankle dorsiflexion    Ankle plantarflexion    Ankle inversion    Ankle eversion    (Blank rows = not tested) - BLE not tested secondary to weight bearing precaution of B LE NWB  BED MOBILITY:  Unable to test due to NWB status of BLE- Patient currently dependent with all bed mobility using hoyer and caregiver assist  TRANSFERS: Currently BLE  NWB- Dependent on use of hoyer for all transfers  FUNCTIONAL TESTs:   PATIENT SURVEYS:  FOTO 12  TODAY'S TREATMENT:  01/06/2022  -COPIED FROM [facility]-DR. H. MILLER note from 12/30/2021 12/30/2021 12/30/2021 Assessment and Plan:  Impression: 1. Bilateral hip fractures.  Recommendation: The patient will continue the current program until 02/01/2022 when she may try some weightbearing exercises as well. The patient will follow up in 03/2022 for exam and x-ray of both hips.  ATTESTATION:  This note has been generated by Romero Liner and edited by Christiana Fuchs, Quality Documentation Specialist.     - COPIED FROM [facility]- DR. Jonny Ruiz Note from 11/18/2021:  11/18/2021 11/18/2021 Assessment and Plan:  Bilateral hip fractures.   Recommendation:  The patient's PT may start with range of motion and more activity with the hips. I would like to avoid weightbearing still. We will see her back here in 6 weeks for an exam and x-ray of both hips.    THEREX: In power w/c (due to B NWB LE status)    -AAROM Hip flex  x 15 reps each LE x 2 sets --Hip abduction -AAROM 2 sets of 15 reps each LE -Hip adduction with ball squeeze hold 5 sec x 15 reps each.  -Seated LAQ- no weight 2 sets of 15 reps each LE.  -assisted heel slide 2 sets of 12 reps- no pain in B hips    MMT Right 01/11/22 Left 01/11/22  Hip flexion 2- 2-  Hip extension 2- 2-  Hip abduction 2- 2-  Hip adduction 2- 3  Hip internal rotation 2- 2-  Hip external rotation 2- 2-  Knee flexion 2- 2-  Knee extension 2+ 3+  Ankle dorsiflexion 2- NT - boot  Ankle plantarflexion 2- NT- boot  Ankle inversion 2- NT- boot  Ankle eversion 2- NT- boot  (Blank rows = not tested)     PATIENT EDUCATION: Education details: PT plan of care/use of resistive bands Person educated: Patient Education method: Explanation, Demonstration, Tactile cues, and Verbal cues Education comprehension: verbalized understanding, returned  demonstration, verbal cues required, tactile cues required, and needs further education   HOME EXERCISE PROGRAM: Discussed use of theraband and demo shoulder flex/abd and elbow flex and ext with use of yellow TB. Patient required assist to position therband but able to perform well with only one person assist  Access Code: [facility] URL: [email or web address] Date: 11/23/2021 Prepared by: Janna Arch Exercises - Seated Gluteal Sets - 2 x daily - 7 x weekly - 2 sets - 10 reps - 5 hold - Seated Quad Set - 2 x daily - 7 x weekly - 2 sets - 10 reps - 5 hold - Seated Long Arc Quad - 2 x daily - 7 x weekly - 2 sets - 10 reps - 5 hold - Seated March - 2 x daily - 7 x weekly - 2 sets - 10 reps - 5 hold - Seated Hip Abduction - 2 x daily -  7 x weekly - 2 sets - 10 reps - 5 hold - Seated Shoulder Shrugs - 2 x daily - 7 x weekly - 2 sets - 10 reps - 5 hold - Wheelchair Pressure Relief - 2 x daily - 7 x weekly - 2 sets - 10 reps - 5 hold  GOALS: Goals reviewed with patient? Yes  SHORT TERM GOALS: Target date: 02/22/2022  Pt will be independent with initial UE strengthening HEP in order to improve strength and balance in order to decrease fall risk and improve function at home and work. Baseline: 10/19/2021- patient with no formal UE HEP; 12/02/2021= Patient verbalized knowledge of HEP including use of theraband for UE strengthening.  Goal status: GOAL MET  LONG TERM GOALS: Target date: 04/05/2022  Pt will be independent with final for UE/LE HEP in order to improve strength and balance in order to decrease fall risk and improve function at home and work. Baseline: Patient is currently BLE NWB and unable to participate in HEP. Has order for UE strengthening. 01/11/2022- Patient now able to participate in LE strengthening although NWB still- good understanding for some basic exercises. Will keep goal active to incorporate progressive LE strengthening exercises. Goal status: Progressing  2.  Pt will  improve FOTO to target score of 35  to display perceived improvements in ability to complete ADL's.  Baseline: 10/19/2021= 12; 12/02/2021= 17; 01/11/2022=17 Goal status: Ongoing  3.  Pt will increase strength of B UE  by at least 1/2 MMT grade in order to demonstrate improvement in strength and function  Baseline: patient has range of 2-/5 to 4/5 BUE Strength; 12/02/2021= 4/5 except with wrist ext. 01/11/2022= 4/5 B UE strength expept for wrist Goal status: Partially met  ASSESSMENT:  CLINICAL IMPRESSION: Patient presents for recert visit. She remaining in MD restriction of BLE NWB so activities have been limited to LE strengthening in NWB since she was cleared to begin some LE exercises. Goals are basic and unable to progress FOTO yet as patient remains very limited and restricted in mobility due to her NWB precautions. She has demo good overall progress with BLE since initiating strengthening- and will continue to benefit from skilled PT services to improve strength in anticipation for BLE clearance for [place] activities. Patient's condition has the potential to improve in response to therapy. Maximum improvement is yet to be obtained. The anticipated improvement is attainable and reasonable in a generally predictable time.      OBJECTIVE IMPAIRMENTS Abnormal gait, decreased activity tolerance, decreased balance, decreased coordination, decreased endurance, decreased mobility, difficulty walking, decreased ROM, decreased strength, hypomobility, impaired flexibility, impaired UE functional use, postural dysfunction, and pain.   ACTIVITY LIMITATIONS carrying, lifting, bending, sitting, standing, squatting, sleeping, stairs, transfers, bed mobility, continence, bathing, toileting, dressing, self feeding, reach over head, hygiene/grooming, and caring for others  PARTICIPATION LIMITATIONS: meal prep, cleaning, laundry, medication management, personal finances, interpersonal relationship, driving,  shopping, community activity, and yard work  PERSONAL FACTORS Age, Time since onset of injury/illness/exacerbation, and 1-2 comorbidities: HTN, cervical Sx  are also affecting patient's functional outcome.   REHAB POTENTIAL: Good  CLINICAL DECISION MAKING: Evolving/moderate complexity  EVALUATION COMPLEXITY: Moderate  PLAN: PT FREQUENCY: 1-2x/week  PT DURATION: 12 weeks  PLANNED INTERVENTIONS: Therapeutic exercises, Therapeutic activity, Neuromuscular re-education, Balance training, Gait training, Patient/Family education, Self Care, Joint mobilization, DME instructions, Dry Needling, Electrical stimulation, Wheelchair mobility training, Spinal mobilization, Cryotherapy, Moist heat, and Manual therapy  PLAN FOR NEXT SESSION: Instruct in home program for UE strengthening  Ollen Bowl, PT 1:12 PM,01/11/22  Physical Therapist- [facility]  01/11/2022, 1:12 PM

## 2022-01-13 ENCOUNTER — Ambulatory Visit: Payer: Medicare HMO

## 2022-01-13 ENCOUNTER — Ambulatory Visit: Payer: Medicare HMO | Admitting: Occupational Therapy

## 2022-01-13 DIAGNOSIS — R269 Unspecified abnormalities of gait and mobility: Secondary | ICD-10-CM

## 2022-01-13 DIAGNOSIS — M6281 Muscle weakness (generalized): Secondary | ICD-10-CM

## 2022-01-13 DIAGNOSIS — R262 Difficulty in walking, not elsewhere classified: Secondary | ICD-10-CM

## 2022-01-13 DIAGNOSIS — R2689 Other abnormalities of gait and mobility: Secondary | ICD-10-CM

## 2022-01-13 DIAGNOSIS — R2681 Unsteadiness on feet: Secondary | ICD-10-CM

## 2022-01-13 DIAGNOSIS — S72001A Fracture of unspecified part of neck of right femur, initial encounter for closed fracture: Secondary | ICD-10-CM

## 2022-01-13 DIAGNOSIS — R278 Other lack of coordination: Secondary | ICD-10-CM

## 2022-01-13 NOTE — Therapy (Signed)
OUTPATIENT PHYSICAL THERAPY NEURO TREATMENT/Physical Therapy Progress Note   Dates of reporting period  12/02/2021   to   01/13/2022    Patient Name: Sherry [name] MRN: [id number] DOB:May 25, 1934, 87 y.o., female Today's Date: 01/13/2022   PCP: Dewayne Shorter, MD REFERRING PROVIDER: Earnestine Leys   PT End of Session - 01/13/22 1049     Visit Number 20    Number of Visits 24    Date for PT Re-Evaluation 04/05/22    Authorization Time Period Initial cert 1/54/0086-76/19/5093; Recert 26/71/2458-0/10/9831    Progress Note Due on Visit 20    PT Start Time 1145    PT Stop Time 1224    PT Time Calculation (min) 39 min    Equipment Utilized During Treatment Gait belt    Activity Tolerance Patient tolerated treatment well    Behavior During Therapy WFL for tasks assessed/performed                      Past Medical History:  Diagnosis Date   Acute blood loss anemia    Arthritis    Cancer ([place])    skin   Central cord syndrome at C6 level of cervical spinal cord ([place]) 11/29/2017   Hypertension    Protein-calorie malnutrition, severe ([place]) 01/24/2018   S/P insertion of IVC (inferior vena caval) filter 01/24/2018   Tetraparesis ([place])    Past Surgical History:  Procedure Laterality Date   ANTERIOR CERVICAL DECOMP/DISCECTOMY FUSION N/A 11/29/2017   Procedure: Cervical five-six, six-seven Anterior Cervical Decompression Fusion;  Surgeon: Judith Part, MD;  Location: [place];  Service: Neurosurgery;  Laterality: N/A;  Cervical five-six, six-seven Anterior Cervical Decompression Fusion   CATARACT EXTRACTION     FEMUR IM NAIL Left 08/16/2021   Procedure: INTRAMEDULLARY (IM) NAIL FEMORAL;  Surgeon: Earnestine Leys, MD;  Location: [facility];  Service: Orthopedics;  Laterality: Left;   IR IVC FILTER PLMT / S&I /IMG GUID/MOD SED  12/08/2017   Patient Active Problem List   Diagnosis Date Noted   Closed hip fracture ([place]) 08/15/2021   DVT (deep venous thrombosis) ([place])  08/15/2021   Quadriplegia ([place]) 08/15/2021   Fall 08/15/2021   Constipation due to slow transit 08/31/2018   Trauma 06/05/2018   Neuropathic pain 06/05/2018   Neurogenic bowel 06/05/2018   Vaginal yeast infection 01/30/2018   Healthcare-associated pneumonia 01/25/2018   Chronic allergic rhinitis 01/24/2018   Depression with anxiety 01/24/2018   UTI due to Klebsiella species 01/24/2018   Protein-calorie malnutrition, severe ([place]) 01/24/2018   S/P insertion of IVC (inferior vena caval) filter 01/24/2018   Tetraparesis ([place]) 01/20/2018   Neurogenic bladder 01/20/2018   Reactive depression    Benign essential HTN    Acute postoperative anemia due to expected blood loss    Central cord syndrome at C6 level of cervical spinal cord ([place]) 11/29/2017   Allergy to alpha-gal 11/25/2016   SCC (squamous cell carcinoma) 04/08/2014    ONSET DATE: 08/15/2021 (fall with B hip fx); Initial injury was in 2019- quadraplegia due to central cord syndrom of C6 secondary to neck fracture.   REFERRING DIAG: Bilateral Hip fx; Left Open- s/p Left IM nail femoral on 08/16/2021; Right closed  THERAPY DIAG:  Muscle weakness (generalized)  Other lack of coordination  Abnormality of gait and mobility  Difficulty in walking, not elsewhere classified  Other abnormalities of gait and mobility  Closed fracture of both hips, initial encounter (HCC)  Unsteadiness on feet  Rationale for Evaluation and Treatment  Rehabilitation  SUBJECTIVE:                                                                                                                                                                                              SUBJECTIVE STATEMENT:    Patient reports having a good day with no complaints.       Pt accompanied by:  Paid caregiver-   PERTINENT HISTORY: Patient was previous patient (see previous episode for specifics) She experienced a fall at home on 08/15/2021 and was in [facility] from  08/15/2021- 08/20/2021.  Most recent history per Dr. Laurey Arrow on 08/20/2021.  Sherry [name] is a 87 y.o. 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   Patient has history of quadriplegia, and has been doing PT/OT with improvement of function. Pt has some use of both arms and can feed herself. She can walk a little bit with assistance. Pt accidentally fell at about 930 this morning after taking shower.  She injured her hips. Hospital Course:    Closed left hip fracture [facility]) s/p mechanical fall Closed right hip fracture (?old) Acute blood loss anemia --pt is s/p  INTRAMEDULLARY (IM) NAIL FEMORAL-left on 7/16  PAIN:  Are you having pain? NO: NPRS scale: 0/10 Pain location: Right hip Pain description: Ache Aggravating factors: any active Movement of right side Relieving factors: Rest  PRECAUTIONS: Fall  WEIGHT BEARING RESTRICTIONS Yes Bilateral non-weight bearing B LE's  FALLS: Has patient fallen in last 6 months? Yes. Number of falls 1  LIVING ENVIRONMENT: Lives with: lives with their family and lives in a nursing home Lives in: Other Long term care at [place]: No Has following equipment at home: Wheelchair (power), Ramped entry, and hoyer lift  PLOF: Needs assistance with ADLs, Needs assistance with homemaking, Needs assistance with gait, Needs assistance with transfers, and Leisure: Work puzzles, read, Watch TV  PATIENT GOALS Patient goal is to walk; Be able to strengthen my arms for better use  OBJECTIVE:   DIAGNOSTIC FINDINGS: Overall cognitive statusEXAM: DG HIP (WITH OR WITHOUT PELVIS) 2-3V LEFT; DG HIP (WITH OR WITHOUT PELVIS) 2-3V RIGHT   COMPARISON:  CT of the abdomen and pelvis 01/21/2020   FINDINGS: Left proximal femur fracture  noted just below the intertrochanteric region. Marked ventral angulation noted on the cross-table image. Slight varus angulation present   Comminuted intratrochanteric fracture present on the right also with marked ventral angulation.  IMPRESSION: 1. Bilateral hip fractures. 2. Comminuted intertrochanteric fracture of the right hip. 3. Angulated fracture of the proximal left femur just below the intertrochanteric region.     Electronically Signed   By: San Morelle M.D.   On: 08/15/2021 11:54:   COGNITION:  Within functional limits for tasks assessed   SENSATION: Light touch: Impaired   EDEMA:  None  Strength R/L 2-/2- Shoulder flexion (anterior deltoid/pec major/coracobrachialis, axillary n. (C5-6) and musculocutaneous n. (C5-7)) 2-/3+ (within available ROM around 130 on lef)Shoulder abduction (deltoid/supraspinatus, axillary/suprascapular n, C5) 3+/3+ Shoulder external rotation (infraspinatus/teres minor) 4/4 Shoulder internal rotation (subcapularis/lats/pec major) 3+/3+ Shoulder extension (posterior deltoid, lats, teres major, axillary/thoracodorsal n.) 3+/3+ Shoulder horizontal abduction 4/4 Elbow flexion (biceps brachii, brachialis, brachioradialis, musculoskeletal n, C5-6) 4/4 Elbow extension (triceps, radial n, C7) 2-/2- Wrist Extension 3+/3+ Wrist Flexion   LOWER EXTREMITY MMT:    MMT Right Eval Left Eval  Hip flexion    Hip extension    Hip abduction    Hip adduction    Hip internal rotation    Hip external rotation    Knee flexion    Knee extension    Ankle dorsiflexion    Ankle plantarflexion    Ankle inversion    Ankle eversion    (Blank rows = not tested) - BLE not tested secondary to weight bearing precaution of B LE NWB  BED MOBILITY:  Unable to test due to NWB status of BLE- Patient currently dependent with all bed mobility using hoyer and caregiver assist  TRANSFERS: Currently BLE NWB- Dependent on use of hoyer for all  transfers  FUNCTIONAL TESTs:   PATIENT SURVEYS:  FOTO 12  TODAY'S TREATMENT:   -COPIED FROM [facility]-DR. H. MILLER note from 12/30/2021 12/30/2021 12/30/2021 Assessment and Plan:  Impression: 1. Bilateral hip fractures.  Recommendation: The patient will continue the current program until 02/01/2022 when she may try some weightbearing exercises as well. The patient will follow up in 03/2022 for exam and x-ray of both hips.  ATTESTATION:  This note has been generated by Romero Liner and edited by Christiana Fuchs, Quality Documentation Specialist.     - COPIED FROM [facility]- DR. Jonny Ruiz Note from 11/18/2021:  11/18/2021 11/18/2021 Assessment and Plan:  Bilateral hip fractures.   Recommendation:  The patient's PT may start with range of motion and more activity with the hips. I would like to avoid weightbearing still. We will see her back here in 6 weeks for an exam and x-ray of both hips.    THEREX: In power w/c (due to B NWB LE status)   PROM to Left and Right hamstring/gastroc/hip flex x 30 sec x 3 each LE  -AAROM Hip flex  x 15 reps each LE x 2 sets  -Ankle DF/PF each LE x 15 reps x 2 sets  -Hip abduction -AAROM 2 sets of 15 reps each LE  -Hip adduction with ball squeeze hold 5 sec x 15 reps each.   -Seated LAQ- no weight on right LE 3 sets of 12 reps -Seated LAQ- 12 reps with 2lb; 10 reps with 2.5lb; 8 reps with 3lb; and 6 reps with 4lb.  -assisted heel slide 2 sets of 12 reps- no pain in B hips    MMT Right 01/11/22 Left 01/11/22  Hip flexion 2- 2-  Hip extension 2- 2-  Hip abduction 2- 2-  Hip adduction 2- 3  Hip internal rotation 2- 2-  Hip external rotation 2- 2-  Knee flexion 2-  2-  Knee extension 2+ 3+  Ankle dorsiflexion 2- NT - boot  Ankle plantarflexion 2- NT- boot  Ankle inversion 2- NT- boot  Ankle eversion 2- NT- boot  (Blank rows = not tested)     PATIENT EDUCATION: Education details: PT plan of care/use of resistive  bands Person educated: Patient Education method: Explanation, Demonstration, Tactile cues, and Verbal cues Education comprehension: verbalized understanding, returned demonstration, verbal cues required, tactile cues required, and needs further education   HOME EXERCISE PROGRAM: Discussed use of theraband and demo shoulder flex/abd and elbow flex and ext with use of yellow TB. Patient required assist to position therband but able to perform well with only one person assist  Access Code: [facility] URL: [email or web address] Date: 11/23/2021 Prepared by: Janna Arch Exercises - Seated Gluteal Sets - 2 x daily - 7 x weekly - 2 sets - 10 reps - 5 hold - Seated Quad Set - 2 x daily - 7 x weekly - 2 sets - 10 reps - 5 hold - Seated Long Arc Quad - 2 x daily - 7 x weekly - 2 sets - 10 reps - 5 hold - Seated March - 2 x daily - 7 x weekly - 2 sets - 10 reps - 5 hold - Seated Hip Abduction - 2 x daily - 7 x weekly - 2 sets - 10 reps - 5 hold - Seated Shoulder Shrugs - 2 x daily - 7 x weekly - 2 sets - 10 reps - 5 hold - Wheelchair Pressure Relief - 2 x daily - 7 x weekly - 2 sets - 10 reps - 5 hold  GOALS: Goals reviewed with patient? Yes  SHORT TERM GOALS: Target date: 02/22/2022  Pt will be independent with initial UE strengthening HEP in order to improve strength and balance in order to decrease fall risk and improve function at home and work. Baseline: 10/19/2021- patient with no formal UE HEP; 12/02/2021= Patient verbalized knowledge of HEP including use of theraband for UE strengthening.  Goal status: GOAL MET  LONG TERM GOALS: Target date: 04/05/2022  Pt will be independent with final for UE/LE HEP in order to improve strength and balance in order to decrease fall risk and improve function at home and work. Baseline: Patient is currently BLE NWB and unable to participate in HEP. Has order for UE strengthening. 01/11/2022- Patient now able to participate in LE strengthening although NWB  still- good understanding for some basic exercises. Will keep goal active to incorporate progressive LE strengthening exercises. Goal status: Progressing  2.  Pt will improve FOTO to target score of 35  to display perceived improvements in ability to complete ADL's.  Baseline: 10/19/2021= 12; 12/02/2021= 17; 01/11/2022=17 Goal status: Ongoing  3.  Pt will increase strength of B UE  by at least 1/2 MMT grade in order to demonstrate improvement in strength and function  Baseline: patient has range of 2-/5 to 4/5 BUE Strength; 12/02/2021= 4/5 except with wrist ext. 01/11/2022= 4/5 B UE strength expept for wrist Goal status: Partially met  ASSESSMENT:  CLINICAL IMPRESSION: Patient was just reassessed last visit for recert visit - See details in goals for progress note from that visit. Patient was able to increase overall resistance with left LE during today's session. She was appropriately fatigued following anterior hip flex and hip abd today but continued to lift her legs as much as could exhibiting excellent motivation. Patient's condition has the potential to improve in response to therapy. Maximum improvement is  yet to be obtained. The anticipated improvement is attainable and reasonable in a generally predictable time.      OBJECTIVE IMPAIRMENTS Abnormal gait, decreased activity tolerance, decreased balance, decreased coordination, decreased endurance, decreased mobility, difficulty walking, decreased ROM, decreased strength, hypomobility, impaired flexibility, impaired UE functional use, postural dysfunction, and pain.   ACTIVITY LIMITATIONS carrying, lifting, bending, sitting, standing, squatting, sleeping, stairs, transfers, bed mobility, continence, bathing, toileting, dressing, self feeding, reach over head, hygiene/grooming, and caring for others  PARTICIPATION LIMITATIONS: meal prep, cleaning, laundry, medication management, personal finances, interpersonal relationship, driving, shopping,  community activity, and yard work  PERSONAL FACTORS Age, Time since onset of injury/illness/exacerbation, and 1-2 comorbidities: HTN, cervical Sx  are also affecting patient's functional outcome.   REHAB POTENTIAL: Good  CLINICAL DECISION MAKING: Evolving/moderate complexity  EVALUATION COMPLEXITY: Moderate  PLAN: PT FREQUENCY: 1-2x/week  PT DURATION: 12 weeks  PLANNED INTERVENTIONS: Therapeutic exercises, Therapeutic activity, Neuromuscular re-education, Balance training, Gait training, Patient/Family education, Self Care, Joint mobilization, DME instructions, Dry Needling, Electrical stimulation, Wheelchair mobility training, Spinal mobilization, Cryotherapy, Moist heat, and Manual therapy  PLAN FOR NEXT SESSION: Instruct in home program for UE strengthening  Ollen Bowl, PT 2:26 PM,01/13/22  Physical Therapist- [facility]  01/13/2022, 2:26 PM

## 2022-01-13 NOTE — Therapy (Signed)
OCCUPATIONAL THERAPY TREATMENT NOTE    Patient Name: Sherry [name] MRN: [id number] DOB:December 14, 1934, 87 y.o., female Today's Date: 01/13/2022                                         PCP: Dr. Viviana Simpler REFERRING PROVIDER: Dr. Viviana Simpler   OT End of Session - 01/13/22 1527     Visit Number 27    Number of Visits 48    Date for OT Re-Evaluation 03/03/22    Authorization Time Period Progress report period starting 09/14/2021    OT Start Time 29    OT Stop Time 1145    OT Time Calculation (min) 45 min    Activity Tolerance Patient tolerated treatment well    Behavior During Therapy [facility] for tasks assessed/performed                 Past Medical History:  Diagnosis Date   Acute blood loss anemia    Arthritis    Cancer ([place])    skin   Central cord syndrome at C6 level of cervical spinal cord ([place]) 11/29/2017   Hypertension    Protein-calorie malnutrition, severe ([place]) 01/24/2018   S/P insertion of IVC (inferior vena caval) filter 01/24/2018   Tetraparesis ([place])    Past Surgical History:  Procedure Laterality Date   ANTERIOR CERVICAL DECOMP/DISCECTOMY FUSION N/A 11/29/2017   Procedure: Cervical five-six, six-seven Anterior Cervical Decompression Fusion;  Surgeon: Judith Part, MD;  Location: [place];  Service: Neurosurgery;  Laterality: N/A;  Cervical five-six, six-seven Anterior Cervical Decompression Fusion   CATARACT EXTRACTION     FEMUR IM NAIL Left 08/16/2021   Procedure: INTRAMEDULLARY (IM) NAIL FEMORAL;  Surgeon: Earnestine Leys, MD;  Location: [facility];  Service: Orthopedics;  Laterality: Left;   IR IVC FILTER PLMT / S&I /IMG GUID/MOD SED  12/08/2017   Patient Active Problem List   Diagnosis Date Noted   Closed hip fracture ([place]) 08/15/2021   DVT (deep venous thrombosis) ([place]) 08/15/2021   Quadriplegia ([place]) 08/15/2021   Fall 08/15/2021   Constipation due to slow transit 08/31/2018   Trauma 06/05/2018   Neuropathic pain 06/05/2018   Neurogenic  bowel 06/05/2018   Vaginal yeast infection 01/30/2018   Healthcare-associated pneumonia 01/25/2018   Chronic allergic rhinitis 01/24/2018   Depression with anxiety 01/24/2018   UTI due to Klebsiella species 01/24/2018   Protein-calorie malnutrition, severe ([place]) 01/24/2018   S/P insertion of IVC (inferior vena caval) filter 01/24/2018   Tetraparesis ([place]) 01/20/2018   Neurogenic bladder 01/20/2018   Reactive depression    Benign essential HTN    Acute postoperative anemia due to expected blood loss    Central cord syndrome at C6 level of cervical spinal cord ([place]) 11/29/2017   Allergy to alpha-gal 11/25/2016   SCC (squamous cell carcinoma) 04/08/2014    REFERRING DIAG: Central Cord Syndrome at C6 of the Cervical Spinal Cord, Fall with Bilateral Closed Hip Fractures, with ORIF repair with Intramedullary Nailing of the Left Hip Fracture.  THERAPY DIAG:  Muscle weakness (generalized)  Rationale for Evaluation and Treatment Rehabilitation  PERTINENT HISTORY: Patient s/p fall November 29, 2017 resulting in diagnosis of central cord syndrome at C 6 level.  She has had therapy in multiple venues but with recent move to [facility], therapy staff recommended she seek outpatient therapy for her needs. Pt. Had a recent fall with Bilateral Closed  Hip Fractures, with ORIF repair with Intramedullary Nailing of the Left Hip Fracture.   PRECAUTIONS: None  SUBJECTIVE:   Pt. reports having a family holiday gathering this weekend.  PAIN:  Are you having pain? No     OBJECTIVE:   TODAY'S TREATMENT:   Manual Therapy:   Pt. tolerated soft tissue massage to the volar, and dorsal surface of each digit on the bilateral hands secondary to stiffness. Manual therapy was performed independent of, and in preparation for therapeutic Ex.   Therapeutic Ex.    Pt. worked on [employer], and reciprocal motion using the UBE while seated for 8 min. with minimal resistance. Constant monitoring was  provided. Pt. performed bilateral UE strengthening for bilateral shoulder flexion, chest presses, and elbow flexion, and extension. Pt. worked on bilateral UE strengthening. Pt. tolerated PROM followed by AROM bilateral wrist extension, PROM for bilateral digit MP, PIP, and DIP flexion, and extension, thumb radial, and palmar abduction.        Pt. continues to progress with AROM in right shoulder flexion, and bilateral UE strength. Pt. continues to try to engage her bilateral hands more during daily ADL, and IADL tasks. Pt. Continues to tolerateThere. Ex. On the UBE, and manual therapy well today.  Pt. Continues to be able to reach up in to her closet to access her jacket, and donn it over both arms in reverse position with the back facing the front. Pt. Continues to be able to brush her teeth with assist for set-up, Pt. Is independent with feeding herself more efficiently using standard utensils. Pt. is able to water the cactus in her husband's room while holding and using a cup. Pt. Is able to hold a coffee cup, and drink from it. Pt. reports writing more on her daily menus, however writing continues to appear "shaky". Pt. Continues to present with tightness at the bilateral thumb webspace, and bilateral wrist flexor tightness, as well as digit MP, PIP, and DIP extensor tightness which continues to limit her ability to complete ADL tasks efficiently. Pt. continues to benefit from working on impoving ROM in order to work towards increasing bilateral hand grasp on objects, and increase engagement of her bilateral hands during ADLs, and IADL tasks.   PATIENT EDUCATION: Education details: BUE ROM, hand function, UE functioning Person educated: Patient Education method: Explanation and Demonstration Education comprehension: verbalized understanding, returned demonstration, and needs further education   HOME EXERCISE PROGRAM Continue with ongoing HEPs  Measurements:  12/10/2022:  Shoulder flexion: R:  120(136), L: 138(150) Shoulder abduction: R: 108(120), L: 110(120) Elbow: R: 0-148, L: 0-146 Wrist flexion: R: 55(62), L: 78 Writ extension: R: 34(55), L: 3(4) RD: R: 12(20), L: 16(26) UD: R: 8(24), L: 6(18) Thumb radial abduction: R: 11(20), L: 8(20) Digit flexion to the [facility]: R:  2nd: 5cm(3.5cm), 3rd: 7.5 cm(5cm), 4th: 7cm(3cm), 5th: 6cm(5.5cm) L: 2nd: 8cm(8cm), 3rd: 8cm(8cm), 4th: 7.5cm(7cm), 5th: 7cm(7cm)            OT Long Term Goals - 07/06/21 1106  Target Date: 03/03/2022    OT LONG TERM GOAL #1   Title Patient and caregiver will demonstrate understanding of home exercise program for ROM.    Baseline 12/09/2021: Pt. Reports the restorative ROM program has not resumed at the facility. 10/28/2021: Pt. Is attempting exercises/stretches at home. The facility restorative aides have not resumed exercises. 09/14/2021:  Pt. Reports inconsistencies with restorative ROM at  her care home. Pt/caregiver education to be provided about ongoing ROM needs.    Time 12    Period Weeks    Status Deferred     OT LONG TERM GOAL #2   Title Pt.  Will improve BUE strength to be able to sustain her UE's in elevation to efficiently, and effectively perform hair care, and self-grooming tasks.   Baseline 12/09/2021: right shoulder 4-/5, left shoulder 4/5. Pt. Has improved to  be able to reach up for hair care, and self-grooming tasks. 10/28/2021: Right shoulder 3+/5, Left shoulder 4-/5 09/14/2021:  BUE shoulder strength: 3+/5 for shoulder flexion, and abduction.   Time 12    Period Weeks    Status Ongoing               OT LONG TERM GOAL #3   Title Patient will demonstrate improved composite finger flexion to hold deodorant to apply to underarms.    Baseline 12/09/2021: Bilateral hand/digit MP, PIP, and DIP extension tightness limits her ability achieve digit flexion to hold, and apply deodorant. 10/28/2021:  Pt.  Continues to have difficulty holding the deodorant. 01/12/2021: Pt. Presents with limited digit extension. Pt. Is able to initiate holding deodorant, however is unable to hold it while using it.   Time 12    Period Weeks    Status Ongoing   Target Date                  OT LONG TERM GOAL #4   Title Pt. Will improve bilateral wrist extension in preparation for anticipating, and initiating reaching for objects at the table.    Baseline 12/09/2021: Right  34(55)  Left  3(4) 10/28/2021:  Right 22(38), Left 0(15) 09/14/2021:  Right: 17(35), left 2(15)   Time 12   Period weeks   Status Ongoing   Target Date      OT LONG TERM GOAL #5   Title Pt. will write, and sign her name with 100% legibility, and modified independence.    Baseline 12/09/2021: Pt. Reports legibility appears "shaky" when attempting signing her name. Pt.reports that she is writing more when filling out her menu. 10/28/2021: pt. is able to sign her name, however legibility continues to be inconsistent. Pt. Reports "shaky appearance". 09/14/2021: Pt. continues to fill out her daily menu and complete puzzles. Pt. Continues to present with limited writing legibility.   Time 12    Period Weeks    Status  New          OT LONG TERM GOAL  #6   TITLE Pt. will turn pages in a book with modified independence.    Baseline 10/28/2021: Pt. Is able to consistently turn book pages. 09/14/2021: Pt. has difficulty turning pages.    Time 12    Period Weeks    Status Achieved       OT LONG TERM GOAL  #7  TITLE Pt. Will increase  bilateral lateral pinch strength by 2 lbs to be able to securely grasp items during ADLs, and IADL tasks.   Baseline 12/09/2021:  Right: 6#, Left: 4#  Time 12  Period Weeks  Status New             Plan - 07/22/21 1737     Clinical Impression Statement Pt. continues to progress with AROM in right shoulder flexion, and bilateral UE strength. Pt. continues to try to engage her bilateral hands more during daily  ADL, and IADL tasks. Pt. Continues to tolerateThere. Ex. On the UBE, and manual therapy well today.  Pt. Continues to be able to reach up in to her closet to access her jacket, and donn it over both arms in reverse position with the back facing the front. Pt. Continues to be able to brush her teeth with assist for set-up, Pt. Is independent with feeding herself more efficiently using standard utensils. Pt. is able to water the cactus in her husband's room while holding and using a cup. Pt. Is able to hold a coffee cup, and drink from it. Pt. reports writing more on her daily menus, however writing continues to appear "shaky". Pt. Continues to present with tightness at the bilateral thumb webspace, and bilateral wrist flexor tightness, as well as digit MP, PIP, and DIP extensor tightness which continues to limit her ability to complete ADL tasks efficiently. Pt. continues to benefit from working on impoving ROM in order to work towards increasing bilateral hand grasp on objects, and increase engagement of her bilateral hands during ADLs, and IADL tasks.                   OT Occupational Profile and History Detailed Assessment- Review of Records and additional review of physical, cognitive, psychosocial history related to current functional performance    Occupational performance deficits (Please refer to evaluation for details): ADL's;IADL's;Leisure;Social Participation    Body Structure / Function / Physical Skills ADL;Continence;Dexterity;Flexibility;Strength;ROM;Balance;Coordination;FMC;IADL;Endurance;UE functional use;Decreased knowledge of use of DME;GMC    Psychosocial Skills Coping Strategies    Rehab Potential Fair    Clinical Decision Making Several treatment options, min-mod task modification necessary    Comorbidities Affecting Occupational Performance: Presence of comorbidities impacting occupational performance    Comorbidities impacting occupational performance description: contractures  of bilateral hands, dependent transfers,    Modification or Assistance to Complete Evaluation  No modification of tasks or assist necessary to complete eval    OT Frequency 2x / week    OT Duration 12 weeks    OT Treatment/Interventions Self-care/ADL training;Cryotherapy;Therapeutic exercise;DME and/or AE instruction;Balance training;Neuromuscular education;Manual Therapy;Splinting;Moist Heat;Passive range of motion;Therapeutic activities;Patient/family education    Plan continue to progress ROM of digits, wrists and shoulders as it pertains to completion of ADL tasks that pt values.    Consulted and Agree with Plan of Care Patient            Harrel Carina, MS, OTR/L  Harrel Carina, OT 01/13/2022, 3:29 PM      `

## 2022-01-17 NOTE — Therapy (Signed)
OUTPATIENT PHYSICAL THERAPY NEURO TREATMENT   Patient Name: Sherry [name] MRN: [id number] DOB:26-Jul-1934, 87 y.o., female Today's Date: 01/18/2022   PCP: Sherry Shorter, MD REFERRING PROVIDER: Earnestine [name]   PT End of Session - 01/18/22 1136     Visit Number 21    Number of Visits 43    Date for PT Re-Evaluation 04/05/22    Authorization Time Period Initial cert 9/51/8841-66/07/3014; Recert 02/09/3233-06/08/3218    Progress Note Due on Visit 30    PT Start Time 1144    PT Stop Time 1218    PT Time Calculation (min) 34 min    Equipment Utilized During Treatment Gait belt    Activity Tolerance Patient tolerated treatment well    Behavior During Therapy WFL for tasks assessed/performed                       Past Medical History:  Diagnosis Date   Acute blood loss anemia    Arthritis    Cancer ([place])    skin   Central cord syndrome at C6 level of cervical spinal cord ([place]) 11/29/2017   Hypertension    Protein-calorie malnutrition, severe ([place]) 01/24/2018   S/P insertion of IVC (inferior vena caval) filter 01/24/2018   Tetraparesis ([place])    Past Surgical History:  Procedure Laterality Date   ANTERIOR CERVICAL DECOMP/DISCECTOMY FUSION N/A 11/29/2017   Procedure: Cervical five-six, six-seven Anterior Cervical Decompression Fusion;  Surgeon: Sherry Part, MD;  Location: [facility];  Service: Neurosurgery;  Laterality: N/A;  Cervical five-six, six-seven Anterior Cervical Decompression Fusion   CATARACT EXTRACTION     FEMUR IM NAIL Left 08/16/2021   Procedure: INTRAMEDULLARY (IM) NAIL FEMORAL;  Surgeon: Sherry Leys, MD;  Location: [facility];  Service: Orthopedics;  Laterality: Left;   IR IVC FILTER PLMT / S&I /IMG GUID/MOD SED  12/08/2017   Patient Active Problem List   Diagnosis Date Noted   Closed hip fracture ([place]) 08/15/2021   DVT (deep venous thrombosis) ([place]) 08/15/2021   Quadriplegia ([place]) 08/15/2021   Fall 08/15/2021   Constipation due to slow  transit 08/31/2018   Trauma 06/05/2018   Neuropathic pain 06/05/2018   Neurogenic bowel 06/05/2018   Vaginal yeast infection 01/30/2018   Healthcare-associated pneumonia 01/25/2018   Chronic allergic rhinitis 01/24/2018   Depression with anxiety 01/24/2018   UTI due to Klebsiella species 01/24/2018   Protein-calorie malnutrition, severe ([place]) 01/24/2018   S/P insertion of IVC (inferior vena caval) filter 01/24/2018   Tetraparesis ([place]) 01/20/2018   Neurogenic bladder 01/20/2018   Reactive depression    Benign essential HTN    Acute postoperative anemia due to expected blood loss    Central cord syndrome at C6 level of cervical spinal cord ([place]) 11/29/2017   Allergy to alpha-gal 11/25/2016   SCC (squamous cell carcinoma) 04/08/2014    ONSET DATE: 08/15/2021 (fall with B hip fx); Initial injury was in 2019- quadraplegia due to central cord syndrom of C6 secondary to neck fracture.   REFERRING DIAG: Bilateral Hip fx; Left Open- s/p Left IM nail femoral on 08/16/2021; Right closed  THERAPY DIAG:  Muscle weakness (generalized)  Other lack of coordination  Abnormality of gait and mobility  Difficulty in walking, not elsewhere classified  Other abnormalities of gait and mobility  Closed fracture of both hips, initial encounter ([place])  Unsteadiness on feet  Central cord syndrome, sequela ([place])  Rationale for Evaluation and Treatment Rehabilitation  SUBJECTIVE:  SUBJECTIVE STATEMENT:    Patient reported doing well overall and had a good weekend spending with family.        Pt accompanied by:  Paid caregiver-   PERTINENT HISTORY: Patient was previous patient (see previous episode for specifics) She experienced a fall at home on 08/15/2021 and was in [facility] from 08/15/2021-  08/20/2021.  Most recent history per Dr. Laurey [name] on 08/20/2021.  Sherry [name] is a 87 y.o. 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   Patient has history of quadriplegia, and has been doing PT/OT with improvement of function. Pt has some use of both arms and can feed herself. She can walk a little bit with assistance. Pt accidentally fell at about 930 this morning after taking shower.  She injured her hips. Hospital Course:    Closed left hip fracture [facility]) s/p mechanical fall Closed right hip fracture (?old) Acute blood loss anemia --pt is s/p  INTRAMEDULLARY (IM) NAIL FEMORAL-left on 7/16  PAIN:  Are you having pain? NO: NPRS scale: 0/10 Pain location: Right hip Pain description: Ache Aggravating factors: any active Movement of right side Relieving factors: Rest  PRECAUTIONS: Fall  WEIGHT BEARING RESTRICTIONS Yes Bilateral non-weight bearing B LE's  FALLS: Has patient fallen in last 6 months? Yes. Number of falls 1  LIVING ENVIRONMENT: Lives with: lives with their family and lives in a nursing home Lives in: Other Long term care at [place]: No Has following equipment at home: Wheelchair (power), Ramped entry, and hoyer lift  PLOF: Needs assistance with ADLs, Needs assistance with homemaking, Needs assistance with gait, Needs assistance with transfers, and Leisure: Work puzzles, read, Watch TV  PATIENT GOALS Patient goal is to walk; Be able to strengthen my arms for better use  OBJECTIVE:   DIAGNOSTIC FINDINGS: Overall cognitive statusEXAM: DG HIP (WITH OR WITHOUT PELVIS) 2-3V LEFT; DG HIP (WITH OR WITHOUT PELVIS) 2-3V RIGHT   COMPARISON:  CT of the abdomen and pelvis 01/21/2020   FINDINGS: Left proximal femur fracture noted just  below the intertrochanteric region. Marked ventral angulation noted on the cross-table image. Slight varus angulation present   Comminuted intratrochanteric fracture present on the right also with marked ventral angulation.   IMPRESSION: 1. Bilateral hip fractures. 2. Comminuted intertrochanteric fracture of the right hip. 3. Angulated fracture of the proximal left femur just below the intertrochanteric region.     Electronically Signed   By: San Morelle M.D.   On: 08/15/2021 11:54:   COGNITION:  Within functional limits for tasks assessed   SENSATION: Light touch: Impaired   EDEMA:  None  Strength R/L 2-/2- Shoulder flexion (anterior deltoid/pec major/coracobrachialis, axillary n. (C5-6) and musculocutaneous n. (C5-7)) 2-/3+ (within available ROM around 130 on lef)Shoulder abduction (deltoid/supraspinatus, axillary/suprascapular n, C5) 3+/3+ Shoulder external rotation (infraspinatus/teres minor) 4/4 Shoulder internal rotation (subcapularis/lats/pec major) 3+/3+ Shoulder extension (posterior deltoid, lats, teres major, axillary/thoracodorsal n.) 3+/3+ Shoulder horizontal abduction 4/4 Elbow flexion (biceps brachii, brachialis, brachioradialis, musculoskeletal n, C5-6) 4/4 Elbow extension (triceps, radial n, C7) 2-/2- Wrist Extension 3+/3+ Wrist Flexion   LOWER EXTREMITY MMT:    MMT Right Eval Left Eval  Hip flexion    Hip extension    Hip abduction    Hip adduction    Hip internal rotation    Hip external  rotation    Knee flexion    Knee extension    Ankle dorsiflexion    Ankle plantarflexion    Ankle inversion    Ankle eversion    (Blank rows = not tested) - BLE not tested secondary to weight bearing precaution of B LE NWB  BED MOBILITY:  Unable to test due to NWB status of BLE- Patient currently dependent with all bed mobility using hoyer and caregiver assist  TRANSFERS: Currently BLE NWB- Dependent on use of hoyer for all  transfers  FUNCTIONAL TESTs:   PATIENT SURVEYS:  FOTO 12  TODAY'S TREATMENT:   -COPIED FROM [facility]-DR. H. MILLER note from 12/30/2021 12/30/2021 12/30/2021 Assessment and Plan:  Impression: 1. Bilateral hip fractures.  Recommendation: The patient will continue the current program until 02/01/2022 when she may try some weightbearing exercises as well. The patient will follow up in 03/2022 for exam and x-ray of both hips.  ATTESTATION:  This note has been generated by Romero Liner and edited by Christiana Fuchs, Quality Documentation Specialist.     - COPIED FROM [facility]- DR. Jonny Ruiz Note from 11/18/2021:  11/18/2021 11/18/2021 Assessment and Plan:  Bilateral hip fractures.   Recommendation:  The patient's PT may start with range of motion and more activity with the hips. I would like to avoid weightbearing still. We will see her back here in 6 weeks for an exam and x-ray of both hips.    THEREX: In power w/c (due to B NWB LE status)   PROM to Left and Right hamstring/gastroc/hip flex x 30 sec x 3 each LE  -AAROM Hip flex  x 15 reps each LE x 2 sets  -Hip abduction -AAROM 2 sets of 15 reps each LE  -Hip adduction with ball squeeze hold 5 sec x 15 reps each.   -Seated LAQ- no weight on right LE   1 set of 12 reps without AW  1 set of 10 reps without AW  1 set of 6 reps with 2lb AW  1 set of 6 reps without AW   -Seated LAQ- 12 reps without weight; 10 reps with 2 lb; 8 reps with 3lb; and 12 reps with 4lb. (Patient kept going after 6 reps and with increased effort/time- able to complete 12 reps)   -Resistive ham curl with YTB x 15 reps  MMT Right 01/11/22 Left 01/11/22  Hip flexion 2- 2-  Hip extension 2- 2-  Hip abduction 2- 2-  Hip adduction 2- 3  Hip internal rotation 2- 2-  Hip external rotation 2- 2-  Knee flexion 2- 2-  Knee extension 2+ 3+  Ankle dorsiflexion 2- NT - boot  Ankle plantarflexion 2- NT- boot  Ankle inversion 2- NT- boot  Ankle  eversion 2- NT- boot  (Blank rows = not tested)     PATIENT EDUCATION: Education details: PT plan of care/use of resistive bands Person educated: Patient Education method: Explanation, Demonstration, Tactile cues, and Verbal cues Education comprehension: verbalized understanding, returned demonstration, verbal cues required, tactile cues required, and needs further education   HOME EXERCISE PROGRAM: Discussed use of theraband and demo shoulder flex/abd and elbow flex and ext with use of yellow TB. Patient required assist to position therband but able to perform well with only one person assist  Access Code: [facility] URL: [email or web address] Date: 11/23/2021 Prepared by: Janna Arch Exercises - Seated Gluteal Sets - 2 x daily - 7 x weekly - 2 sets - 10 reps - 5 hold -  Seated Quad Set - 2 x daily - 7 x weekly - 2 sets - 10 reps - 5 hold - Seated Long Arc Quad - 2 x daily - 7 x weekly - 2 sets - 10 reps - 5 hold - Seated March - 2 x daily - 7 x weekly - 2 sets - 10 reps - 5 hold - Seated Hip Abduction - 2 x daily - 7 x weekly - 2 sets - 10 reps - 5 hold - Seated Shoulder Shrugs - 2 x daily - 7 x weekly - 2 sets - 10 reps - 5 hold - Wheelchair Pressure Relief - 2 x daily - 7 x weekly - 2 sets - 10 reps - 5 hold  GOALS: Goals reviewed with patient? Yes  SHORT TERM GOALS: Target date: 02/22/2022  Pt will be independent with initial UE strengthening HEP in order to improve strength and balance in order to decrease fall risk and improve function at home and work. Baseline: 10/19/2021- patient with no formal UE HEP; 12/02/2021= Patient verbalized knowledge of HEP including use of theraband for UE strengthening.  Goal status: GOAL MET  LONG TERM GOALS: Target date: 04/05/2022  Pt will be independent with final for UE/LE HEP in order to improve strength and balance in order to decrease fall risk and improve function at home and work. Baseline: Patient is currently BLE NWB and unable to  participate in HEP. Has order for UE strengthening. 01/11/2022- Patient now able to participate in LE strengthening although NWB still- good understanding for some basic exercises. Will keep goal active to incorporate progressive LE strengthening exercises. Goal status: Progressing  2.  Pt will improve FOTO to target score of 35  to display perceived improvements in ability to complete ADL's.  Baseline: 10/19/2021= 12; 12/02/2021= 17; 01/11/2022=17 Goal status: Ongoing  3.  Pt will increase strength of B UE  by at least 1/2 MMT grade in order to demonstrate improvement in strength and function  Baseline: patient has range of 2-/5 to 4/5 BUE Strength; 12/02/2021= 4/5 except with wrist ext. 01/11/2022= 4/5 B UE strength expept for wrist Goal status: Partially met  ASSESSMENT:  CLINICAL IMPRESSION:   Treatment continues to progress with LE strengthening to prepare for upcoming progress to pre-gait/weight bearing activities. She was able to progress overall with right LE quad and able to lift leg against gravity in seated position. She also finished with increased reps with left quad while performing with increased resistance.  Pt will continue to benefit from skilled PT services to improve strength in anticipation for BLE clearance for [place] activities   OBJECTIVE IMPAIRMENTS Abnormal gait, decreased activity tolerance, decreased balance, decreased coordination, decreased endurance, decreased mobility, difficulty walking, decreased ROM, decreased strength, hypomobility, impaired flexibility, impaired UE functional use, postural dysfunction, and pain.   ACTIVITY LIMITATIONS carrying, lifting, bending, sitting, standing, squatting, sleeping, stairs, transfers, bed mobility, continence, bathing, toileting, dressing, self feeding, reach over head, hygiene/grooming, and caring for others  PARTICIPATION LIMITATIONS: meal prep, cleaning, laundry, medication management, personal finances, interpersonal  relationship, driving, shopping, community activity, and yard work  PERSONAL FACTORS Age, Time since onset of injury/illness/exacerbation, and 1-2 comorbidities: HTN, cervical Sx  are also affecting patient's functional outcome.   REHAB POTENTIAL: Good  CLINICAL DECISION MAKING: Evolving/moderate complexity  EVALUATION COMPLEXITY: Moderate  PLAN: PT FREQUENCY: 1-2x/week  PT DURATION: 12 weeks  PLANNED INTERVENTIONS: Therapeutic exercises, Therapeutic activity, Neuromuscular re-education, Balance training, Gait training, Patient/Family education, Self Care, Joint mobilization, DME instructions, Dry Needling,  Electrical stimulation, Wheelchair mobility training, Spinal mobilization, Cryotherapy, Moist heat, and Manual therapy  PLAN FOR NEXT SESSION: Continue with plan for LE strengthening while waiting for clearance to begin future weight bearing activities.    Ollen Bowl, PT 2:04 PM,01/18/22  Physical Therapist- [facility]  01/18/2022, 2:04 PM

## 2022-01-18 ENCOUNTER — Ambulatory Visit: Payer: Medicare HMO

## 2022-01-18 ENCOUNTER — Ambulatory Visit: Payer: Medicare HMO | Admitting: Occupational Therapy

## 2022-01-18 DIAGNOSIS — R278 Other lack of coordination: Secondary | ICD-10-CM

## 2022-01-18 DIAGNOSIS — M6281 Muscle weakness (generalized): Secondary | ICD-10-CM

## 2022-01-18 DIAGNOSIS — R2681 Unsteadiness on feet: Secondary | ICD-10-CM

## 2022-01-18 DIAGNOSIS — R2689 Other abnormalities of gait and mobility: Secondary | ICD-10-CM

## 2022-01-18 DIAGNOSIS — R262 Difficulty in walking, not elsewhere classified: Secondary | ICD-10-CM

## 2022-01-18 DIAGNOSIS — S14129S Central cord syndrome at unspecified level of cervical spinal cord, sequela: Secondary | ICD-10-CM

## 2022-01-18 DIAGNOSIS — R269 Unspecified abnormalities of gait and mobility: Secondary | ICD-10-CM

## 2022-01-18 DIAGNOSIS — S72001A Fracture of unspecified part of neck of right femur, initial encounter for closed fracture: Secondary | ICD-10-CM

## 2022-01-18 NOTE — Therapy (Signed)
OCCUPATIONAL THERAPY TREATMENT NOTE    Patient Name: Sherry [name] MRN: [id number] DOB:11/27/1934, 87 y.o., female Today's Date: 01/18/2022                                         PCP: Dr. Viviana Simpler REFERRING PROVIDER: Dr. Viviana Simpler   OT End of Session - 01/18/22 1149     Visit Number 28    Number of Visits 48    Date for OT Re-Evaluation 03/03/22    Authorization Time Period Progress report period starting 09/14/2021    OT Start Time 59    OT Stop Time 1145    OT Time Calculation (min) 45 min    Activity Tolerance Patient tolerated treatment well    Behavior During Therapy [facility] for tasks assessed/performed                 Past Medical History:  Diagnosis Date   Acute blood loss anemia    Arthritis    Cancer ([place])    skin   Central cord syndrome at C6 level of cervical spinal cord ([place]) 11/29/2017   Hypertension    Protein-calorie malnutrition, severe ([place]) 01/24/2018   S/P insertion of IVC (inferior vena caval) filter 01/24/2018   Tetraparesis ([place])    Past Surgical History:  Procedure Laterality Date   ANTERIOR CERVICAL DECOMP/DISCECTOMY FUSION N/A 11/29/2017   Procedure: Cervical five-six, six-seven Anterior Cervical Decompression Fusion;  Surgeon: Judith Part, MD;  Location: [place];  Service: Neurosurgery;  Laterality: N/A;  Cervical five-six, six-seven Anterior Cervical Decompression Fusion   CATARACT EXTRACTION     FEMUR IM NAIL Left 08/16/2021   Procedure: INTRAMEDULLARY (IM) NAIL FEMORAL;  Surgeon: Earnestine Leys, MD;  Location: [facility];  Service: Orthopedics;  Laterality: Left;   IR IVC FILTER PLMT / S&I /IMG GUID/MOD SED  12/08/2017   Patient Active Problem List   Diagnosis Date Noted   Closed hip fracture ([place]) 08/15/2021   DVT (deep venous thrombosis) ([place]) 08/15/2021   Quadriplegia ([place]) 08/15/2021   Fall 08/15/2021   Constipation due to slow transit 08/31/2018   Trauma 06/05/2018   Neuropathic pain 06/05/2018   Neurogenic  bowel 06/05/2018   Vaginal yeast infection 01/30/2018   Healthcare-associated pneumonia 01/25/2018   Chronic allergic rhinitis 01/24/2018   Depression with anxiety 01/24/2018   UTI due to Klebsiella species 01/24/2018   Protein-calorie malnutrition, severe ([place]) 01/24/2018   S/P insertion of IVC (inferior vena caval) filter 01/24/2018   Tetraparesis ([place]) 01/20/2018   Neurogenic bladder 01/20/2018   Reactive depression    Benign essential HTN    Acute postoperative anemia due to expected blood loss    Central cord syndrome at C6 level of cervical spinal cord ([place]) 11/29/2017   Allergy to alpha-gal 11/25/2016   SCC (squamous cell carcinoma) 04/08/2014    REFERRING DIAG: Central Cord Syndrome at C6 of the Cervical Spinal Cord, Fall with Bilateral Closed Hip Fractures, with ORIF repair with Intramedullary Nailing of the Left Hip Fracture.  THERAPY DIAG:  Muscle weakness (generalized)  Rationale for Evaluation and Treatment Rehabilitation  PERTINENT HISTORY: Patient s/p fall November 29, 2017 resulting in diagnosis of central cord syndrome at C 6 level.  She has had therapy in multiple venues but with recent move to [facility], therapy staff recommended she seek outpatient therapy for her needs. Pt. Had a recent fall with Bilateral Closed  Hip Fractures, with ORIF repair with Intramedullary Nailing of the Left Hip Fracture.   PRECAUTIONS: None  SUBJECTIVE:   Pt. Reports having had a nice weekend at a family holiday gathering.  PAIN:  Are you having pain? No     OBJECTIVE:   TODAY'S TREATMENT:   Manual Therapy:   Pt. tolerated soft tissue massage to the volar, and dorsal surface of each digit on the bilateral hands secondary to stiffness. Manual therapy was performed independent of, and in preparation for therapeutic Ex.   Therapeutic Ex.    Pt. worked on [employer], and reciprocal motion using the UBE while seated for 8 min. with minimal resistance. Constant  monitoring was provided. Pt. performed bilateral UE strengthening for bilateral shoulder flexion, chest presses, and elbow flexion, and extension. Pt. worked on bilateral UE strengthening. Pt. tolerated PROM followed by AROM bilateral wrist extension, PROM for bilateral digit MP, PIP, and DIP flexion, and extension, thumb radial, and palmar abduction. Pt. Performed 2# dowel ex. for UE strengthening secondary to weakness. Bilateral shoulder flexion, and chest presses were performed. Pt. Performed triceps exercises with yellow theraband for 2 sets 10 reps each.       Pt. continues to progress with AROM in right shoulder flexion, and bilateral UE strength. Pt. continues to try to engage her bilateral hands more during daily ADL, and IADL tasks. Pt. Continues to tolerateThere. Ex. On the UBE, and manual therapy well today.  Pt. Tolerated BUE strengthening exercises with the dowel, and yellow resistive theraband. Pt. Continues to be able to reach up in to her closet to access her jacket, and donn it over both arms in reverse position with the back facing the front. Pt. Continues to be able to brush her teeth with assist for set-up, Pt. Is independent with feeding herself more efficiently using standard utensils. Pt. is able to water the cactus in her husband's room while holding and using a cup. Pt. Is able to hold a coffee cup, and drink from it. Pt. reports writing more on her daily menus, however writing continues to appear "shaky". Pt. Continues to present with tightness at the bilateral thumb webspace, and bilateral wrist flexor tightness, as well as digit MP, PIP, and DIP extensor tightness which continues to limit her ability to complete ADL tasks efficiently. Pt. continues to benefit from working on impoving ROM in order to work towards increasing bilateral hand grasp on objects, and increase engagement of her bilateral hands during ADLs, and IADL tasks.   PATIENT EDUCATION: Education details: BUE ROM, hand  function, UE functioning Person educated: Patient Education method: Explanation and Demonstration Education comprehension: verbalized understanding, returned demonstration, and needs further education   HOME EXERCISE PROGRAM Continue with ongoing HEPs  Measurements:  12/10/2022:  Shoulder flexion: R: 120(136), L: 138(150) Shoulder abduction: R: 108(120), L: 110(120) Elbow: R: 0-148, L: 0-146 Wrist flexion: R: 55(62), L: 78 Writ extension: R: 34(55), L: 3(4) RD: R: 12(20), L: 16(26) UD: R: 8(24), L: 6(18) Thumb radial abduction: R: 11(20), L: 8(20) Digit flexion to the [facility]: R:  2nd: 5cm(3.5cm), 3rd: 7.5 cm(5cm), 4th: 7cm(3cm), 5th: 6cm(5.5cm) L: 2nd: 8cm(8cm), 3rd: 8cm(8cm), 4th: 7.5cm(7cm), 5th: 7cm(7cm)            OT Long Term Goals - 07/06/21 1106  Target Date: 03/03/2022    OT LONG TERM GOAL #1   Title Patient and caregiver will demonstrate understanding of home exercise program for ROM.    Baseline 12/09/2021: Pt. Reports the restorative ROM program has not resumed at the facility. 10/28/2021: Pt. Is attempting exercises/stretches at home. The facility restorative aides have not resumed exercises. 09/14/2021:  Pt. Reports inconsistencies with restorative ROM at  her care home. Pt/caregiver education to be provided about ongoing ROM needs.    Time 12    Period Weeks    Status Deferred     OT LONG TERM GOAL #2   Title Pt.  Will improve BUE strength to be able to sustain her UE's in elevation to efficiently, and effectively perform hair care, and self-grooming tasks.   Baseline 12/09/2021: right shoulder 4-/5, left shoulder 4/5. Pt. Has improved to  be able to reach up for hair care, and self-grooming tasks. 10/28/2021: Right shoulder 3+/5, Left shoulder 4-/5 09/14/2021:  BUE shoulder strength: 3+/5 for shoulder flexion, and abduction.   Time 12    Period Weeks    Status Ongoing                OT LONG TERM GOAL #3   Title Patient will demonstrate improved composite finger flexion to hold deodorant to apply to underarms.    Baseline 12/09/2021: Bilateral hand/digit MP, PIP, and DIP extension tightness limits her ability achieve digit flexion to hold, and apply deodorant. 10/28/2021:  Pt. Continues to have difficulty holding the deodorant. 01/12/2021: Pt. Presents with limited digit extension. Pt. Is able to initiate holding deodorant, however is unable to hold it while using it.   Time 12    Period Weeks    Status Ongoing   Target Date                  OT LONG TERM GOAL #4   Title Pt. Will improve bilateral wrist extension in preparation for anticipating, and initiating reaching for objects at the table.    Baseline 12/09/2021: Right  34(55)  Left  3(4) 10/28/2021:  Right 22(38), Left 0(15) 09/14/2021:  Right: 17(35), left 2(15)   Time 12   Period weeks   Status Ongoing   Target Date      OT LONG TERM GOAL #5   Title Pt. will write, and sign her name with 100% legibility, and modified independence.    Baseline 12/09/2021: Pt. Reports legibility appears "shaky" when attempting signing her name. Pt.reports that she is writing more when filling out her menu. 10/28/2021: pt. is able to sign her name, however legibility continues to be inconsistent. Pt. Reports "shaky appearance". 09/14/2021: Pt. continues to fill out her daily menu and complete puzzles. Pt. Continues to present with limited writing legibility.   Time 12    Period Weeks    Status  New          OT LONG TERM GOAL  #6   TITLE Pt. will turn pages in a book with modified independence.    Baseline 10/28/2021: Pt. Is able to consistently turn book pages. 09/14/2021: Pt. has difficulty turning pages.    Time 12    Period Weeks    Status Achieved       OT LONG TERM GOAL  #7  TITLE Pt. Will increase  bilateral lateral pinch strength by 2 lbs to be able to securely grasp items during ADLs, and IADL tasks.   Baseline  12/09/2021:  Right: 6#, Left: 4#  Time 12  Period Weeks  Status New             Plan - 07/22/21 1737     Clinical Impression Statement Pt. continues to progress with AROM in right shoulder flexion, and bilateral UE strength. Pt. continues to try to engage her bilateral hands more during daily ADL, and IADL tasks. Pt. Continues to tolerateThere. Ex. On the UBE, and manual therapy well today.  Pt. Continues to be able to reach up in to her closet to access her jacket, and donn it over both arms in reverse position with the back facing the front. Pt. Continues to be able to brush her teeth with assist for set-up, Pt. Is independent with feeding herself more efficiently using standard utensils. Pt. is able to water the cactus in her husband's room while holding and using a cup. Pt. Is able to hold a coffee cup, and drink from it. Pt. reports writing more on her daily menus, however writing continues to appear "shaky". Pt. Continues to present with tightness at the bilateral thumb webspace, and bilateral wrist flexor tightness, as well as digit MP, PIP, and DIP extensor tightness which continues to limit her ability to complete ADL tasks efficiently. Pt. continues to benefit from working on impoving ROM in order to work towards increasing bilateral hand grasp on objects, and increase engagement of her bilateral hands during ADLs, and IADL tasks.                   OT Occupational Profile and History Detailed Assessment- Review of Records and additional review of physical, cognitive, psychosocial history related to current functional performance    Occupational performance deficits (Please refer to evaluation for details): ADL's;IADL's;Leisure;Social Participation    Body Structure / Function / Physical Skills ADL;Continence;Dexterity;Flexibility;Strength;ROM;Balance;Coordination;FMC;IADL;Endurance;UE functional use;Decreased knowledge of use of DME;G[facility]    Psychosocial Skills Coping Strategies     Rehab Potential Fair    Clinical Decision Making Several treatment options, min-mod task modification necessary    Comorbidities Affecting Occupational Performance: Presence of comorbidities impacting occupational performance    Comorbidities impacting occupational performance description: contractures of bilateral hands, dependent transfers,    Modification or Assistance to Complete Evaluation  No modification of tasks or assist necessary to complete eval    OT Frequency 2x / week    OT Duration 12 weeks    OT Treatment/Interventions Self-care/ADL training;Cryotherapy;Therapeutic exercise;DME and/or AE instruction;Balance training;Neuromuscular education;Manual Therapy;Splinting;Moist Heat;Passive range of motion;Therapeutic activities;Patient/family education    Plan continue to progress ROM of digits, wrists and shoulders as it pertains to completion of ADL tasks that pt values.    Consulted and Agree with Plan of Care Patient            Harrel Carina, MS, OTR/L  Harrel Carina, OT 01/18/2022, 11:53 AM      `

## 2022-01-20 ENCOUNTER — Ambulatory Visit: Payer: Medicare HMO

## 2022-01-20 ENCOUNTER — Ambulatory Visit: Payer: Medicare HMO | Admitting: Occupational Therapy

## 2022-01-20 DIAGNOSIS — M6281 Muscle weakness (generalized): Secondary | ICD-10-CM

## 2022-01-20 DIAGNOSIS — R262 Difficulty in walking, not elsewhere classified: Secondary | ICD-10-CM

## 2022-01-20 DIAGNOSIS — S14129S Central cord syndrome at unspecified level of cervical spinal cord, sequela: Secondary | ICD-10-CM

## 2022-01-20 DIAGNOSIS — R269 Unspecified abnormalities of gait and mobility: Secondary | ICD-10-CM

## 2022-01-20 DIAGNOSIS — R278 Other lack of coordination: Secondary | ICD-10-CM

## 2022-01-20 DIAGNOSIS — R2689 Other abnormalities of gait and mobility: Secondary | ICD-10-CM

## 2022-01-20 DIAGNOSIS — R2681 Unsteadiness on feet: Secondary | ICD-10-CM

## 2022-01-20 DIAGNOSIS — S72001A Fracture of unspecified part of neck of right femur, initial encounter for closed fracture: Secondary | ICD-10-CM

## 2022-01-20 NOTE — Therapy (Signed)
OUTPATIENT PHYSICAL THERAPY NEURO TREATMENT   Patient Name: Sherry [name] MRN: [id number] DOB:1934-12-31, 87 y.o., female Today's Date: 01/21/2022   PCP: Dewayne Shorter, MD REFERRING PROVIDER: Earnestine Leys   PT End of Session - 01/20/22 0831     Visit Number 22    Number of Visits 43    Date for PT Re-Evaluation 04/05/22    Authorization Time Period Initial cert 1/85/6314-97/03/6376; Recert 58/85/0277-05/02/2876    Progress Note Due on Visit 30    PT Start Time 1145    PT Stop Time 1216    PT Time Calculation (min) 31 min    Equipment Utilized During Treatment Gait belt    Activity Tolerance Patient tolerated treatment well    Behavior During Therapy WFL for tasks assessed/performed                       Past Medical History:  Diagnosis Date   Acute blood loss anemia    Arthritis    Cancer ([place])    skin   Central cord syndrome at C6 level of cervical spinal cord ([place]) 11/29/2017   Hypertension    Protein-calorie malnutrition, severe ([place]) 01/24/2018   S/P insertion of IVC (inferior vena caval) filter 01/24/2018   Tetraparesis ([place])    Past Surgical History:  Procedure Laterality Date   ANTERIOR CERVICAL DECOMP/DISCECTOMY FUSION N/A 11/29/2017   Procedure: Cervical five-six, six-seven Anterior Cervical Decompression Fusion;  Surgeon: Judith Part, MD;  Location: [facility];  Service: Neurosurgery;  Laterality: N/A;  Cervical five-six, six-seven Anterior Cervical Decompression Fusion   CATARACT EXTRACTION     FEMUR IM NAIL Left 08/16/2021   Procedure: INTRAMEDULLARY (IM) NAIL FEMORAL;  Surgeon: Earnestine Leys, MD;  Location: [facility];  Service: Orthopedics;  Laterality: Left;   IR IVC FILTER PLMT / S&I /IMG GUID/MOD SED  12/08/2017   Patient Active Problem List   Diagnosis Date Noted   Closed hip fracture ([place]) 08/15/2021   DVT (deep venous thrombosis) ([place]) 08/15/2021   Quadriplegia ([place]) 08/15/2021   Fall 08/15/2021   Constipation due to slow  transit 08/31/2018   Trauma 06/05/2018   Neuropathic pain 06/05/2018   Neurogenic bowel 06/05/2018   Vaginal yeast infection 01/30/2018   Healthcare-associated pneumonia 01/25/2018   Chronic allergic rhinitis 01/24/2018   Depression with anxiety 01/24/2018   UTI due to Klebsiella species 01/24/2018   Protein-calorie malnutrition, severe ([place]) 01/24/2018   S/P insertion of IVC (inferior vena caval) filter 01/24/2018   Tetraparesis ([place]) 01/20/2018   Neurogenic bladder 01/20/2018   Reactive depression    Benign essential HTN    Acute postoperative anemia due to expected blood loss    Central cord syndrome at C6 level of cervical spinal cord ([place]) 11/29/2017   Allergy to alpha-gal 11/25/2016   SCC (squamous cell carcinoma) 04/08/2014    ONSET DATE: 08/15/2021 (fall with B hip fx); Initial injury was in 2019- quadraplegia due to central cord syndrom of C6 secondary to neck fracture.   REFERRING DIAG: Bilateral Hip fx; Left Open- s/p Left IM nail femoral on 08/16/2021; Right closed  THERAPY DIAG:  Muscle weakness (generalized)  Other lack of coordination  Abnormality of gait and mobility  Difficulty in walking, not elsewhere classified  Other abnormalities of gait and mobility  Closed fracture of both hips, initial encounter ([place])  Unsteadiness on feet  Central cord syndrome, sequela ([place])  Rationale for Evaluation and Treatment Rehabilitation  SUBJECTIVE:  SUBJECTIVE STATEMENT:    Patient reported no changes since last visit. Feels like she is getting stronger and likes the resistance she is now able to perform. She continues to deny pain       Pt accompanied by:  Paid caregiver-   PERTINENT HISTORY: Patient was previous patient (see previous episode for specifics) She experienced a  fall at home on 08/15/2021 and was in [facility] from 08/15/2021- 08/20/2021.  Most recent history per Dr. Laurey Arrow on 08/20/2021.  Sherry [name] is a 87 y.o. 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   Patient has history of quadriplegia, and has been doing PT/OT with improvement of function. Pt has some use of both arms and can feed herself. She can walk a little bit with assistance. Pt accidentally fell at about 930 this morning after taking shower.  She injured her hips. Hospital Course:    Closed left hip fracture [facility]) s/p mechanical fall Closed right hip fracture (?old) Acute blood loss anemia --pt is s/p  INTRAMEDULLARY (IM) NAIL FEMORAL-left on 7/16  PAIN:  Are you having pain? NO: NPRS scale: 0/10 Pain location: Right hip Pain description: Ache Aggravating factors: any active Movement of right side Relieving factors: Rest  PRECAUTIONS: Fall  WEIGHT BEARING RESTRICTIONS Yes Bilateral non-weight bearing B LE's  FALLS: Has patient fallen in last 6 months? Yes. Number of falls 1  LIVING ENVIRONMENT: Lives with: lives with their family and lives in a nursing home Lives in: Other Long term care at [place]: No Has following equipment at home: Wheelchair (power), Ramped entry, and hoyer lift  PLOF: Needs assistance with ADLs, Needs assistance with homemaking, Needs assistance with gait, Needs assistance with transfers, and Leisure: Work puzzles, read, Watch TV  PATIENT GOALS Patient goal is to walk; Be able to strengthen my arms for better use  OBJECTIVE:   DIAGNOSTIC FINDINGS: Overall cognitive statusEXAM: DG HIP (WITH OR WITHOUT PELVIS) 2-3V LEFT; DG HIP (WITH OR WITHOUT PELVIS) 2-3V RIGHT   COMPARISON:  CT of the abdomen and pelvis  01/21/2020   FINDINGS: Left proximal femur fracture noted just below the intertrochanteric region. Marked ventral angulation noted on the cross-table image. Slight varus angulation present   Comminuted intratrochanteric fracture present on the right also with marked ventral angulation.   IMPRESSION: 1. Bilateral hip fractures. 2. Comminuted intertrochanteric fracture of the right hip. 3. Angulated fracture of the proximal left femur just below the intertrochanteric region.     Electronically Signed   By: San Morelle M.D.   On: 08/15/2021 11:54:   COGNITION:  Within functional limits for tasks assessed   SENSATION: Light touch: Impaired   EDEMA:  None  Strength R/L 2-/2- Shoulder flexion (anterior deltoid/pec major/coracobrachialis, axillary n. (C5-6) and musculocutaneous n. (C5-7)) 2-/3+ (within available ROM around 130 on lef)Shoulder abduction (deltoid/supraspinatus, axillary/suprascapular n, C5) 3+/3+ Shoulder external rotation (infraspinatus/teres minor) 4/4 Shoulder internal rotation (subcapularis/lats/pec major) 3+/3+ Shoulder extension (posterior deltoid, lats, teres major, axillary/thoracodorsal n.) 3+/3+ Shoulder horizontal abduction 4/4 Elbow flexion (biceps brachii, brachialis, brachioradialis, musculoskeletal n, C5-6) 4/4 Elbow extension (triceps, radial n, C7) 2-/2- Wrist Extension 3+/3+ Wrist Flexion   LOWER EXTREMITY MMT:    MMT Right Eval Left Eval  Hip flexion    Hip extension    Hip abduction  Hip adduction    Hip internal rotation    Hip external rotation    Knee flexion    Knee extension    Ankle dorsiflexion    Ankle plantarflexion    Ankle inversion    Ankle eversion    (Blank rows = not tested) - BLE not tested secondary to weight bearing precaution of B LE NWB  BED MOBILITY:  Unable to test due to NWB status of BLE- Patient currently dependent with all bed mobility using hoyer and caregiver  assist  TRANSFERS: Currently BLE NWB- Dependent on use of hoyer for all transfers  FUNCTIONAL TESTs:   PATIENT SURVEYS:  FOTO 12  TODAY'S TREATMENT:   -COPIED FROM [facility]-DR. H. MILLER note from 12/30/2021 12/30/2021 12/30/2021 Assessment and Plan:  Impression: 1. Bilateral hip fractures.  Recommendation: The patient will continue the current program until 02/01/2022 when she may try some weightbearing exercises as well. The patient will follow up in 03/2022 for exam and x-ray of both hips.  ATTESTATION:  This note has been generated by Romero Liner and edited by Christiana Fuchs, Quality Documentation Specialist.     - COPIED FROM [facility]- DR. Jonny Ruiz Note from 11/18/2021:  11/18/2021 11/18/2021 Assessment and Plan:  Bilateral hip fractures.   Recommendation:  The patient's PT may start with range of motion and more activity with the hips. I would like to avoid weightbearing still. We will see her back here in 6 weeks for an exam and x-ray of both hips.    THEREX: In power w/c (due to B NWB LE status)   PROM to Left and Right hamstring/gastroc/hip flex x 30 sec x 3 each LE  -AAROM Hip flex  x 15 reps each LE x 2 sets  -Hip abduction -AAROM 2 sets of 15 reps each LE  -Hip adduction with ball squeeze hold 5 sec x 15 reps each.   -Seated LAQ- no weight on right LE x 8 reps (attempting to achieve as full ROM as possible- 3 sets of 8 reps with no assist)   And for left LE:   1 set of 12 reps with 3lb AW  1 set of 10 reps with 4lb AW  1 set of 6 reps with 5lb  AW  1 set of 6 reps with 5 lb AW   -Resistive ham curl with YTB x 15 reps - Reclined hip abd with knee straight x 15 reps each LE x 2 sets  MMT Right 01/11/22 Left 01/11/22  Hip flexion 2- 2-  Hip extension 2- 2-  Hip abduction 2- 2-  Hip adduction 2- 3  Hip internal rotation 2- 2-  Hip external rotation 2- 2-  Knee flexion 2- 2-  Knee extension 2+ 3+  Ankle dorsiflexion 2- NT - boot   Ankle plantarflexion 2- NT- boot  Ankle inversion 2- NT- boot  Ankle eversion 2- NT- boot  (Blank rows = not tested)     PATIENT EDUCATION: Education details: PT plan of care/use of resistive bands Person educated: Patient Education method: Explanation, Demonstration, Tactile cues, and Verbal cues Education comprehension: verbalized understanding, returned demonstration, verbal cues required, tactile cues required, and needs further education   HOME EXERCISE PROGRAM: Discussed use of theraband and demo shoulder flex/abd and elbow flex and ext with use of yellow TB. Patient required assist to position therband but able to perform well with only one person assist  Access Code: [facility] URL: [email or web address] Date: 11/23/2021 Prepared by: Janna Arch Exercises -  Seated Gluteal Sets - 2 x daily - 7 x weekly - 2 sets - 10 reps - 5 hold - Seated Quad Set - 2 x daily - 7 x weekly - 2 sets - 10 reps - 5 hold - Seated Long Arc Quad - 2 x daily - 7 x weekly - 2 sets - 10 reps - 5 hold - Seated March - 2 x daily - 7 x weekly - 2 sets - 10 reps - 5 hold - Seated Hip Abduction - 2 x daily - 7 x weekly - 2 sets - 10 reps - 5 hold - Seated Shoulder Shrugs - 2 x daily - 7 x weekly - 2 sets - 10 reps - 5 hold - Wheelchair Pressure Relief - 2 x daily - 7 x weekly - 2 sets - 10 reps - 5 hold  GOALS: Goals reviewed with patient? Yes  SHORT TERM GOALS: Target date: 02/22/2022  Pt will be independent with initial UE strengthening HEP in order to improve strength and balance in order to decrease fall risk and improve function at home and work. Baseline: 10/19/2021- patient with no formal UE HEP; 12/02/2021= Patient verbalized knowledge of HEP including use of theraband for UE strengthening.  Goal status: GOAL MET  LONG TERM GOALS: Target date: 04/05/2022  Pt will be independent with final for UE/LE HEP in order to improve strength and balance in order to decrease fall risk and improve  function at home and work. Baseline: Patient is currently BLE NWB and unable to participate in HEP. Has order for UE strengthening. 01/11/2022- Patient now able to participate in LE strengthening although NWB still- good understanding for some basic exercises. Will keep goal active to incorporate progressive LE strengthening exercises. Goal status: Progressing  2.  Pt will improve FOTO to target score of 35  to display perceived improvements in ability to complete ADL's.  Baseline: 10/19/2021= 12; 12/02/2021= 17; 01/11/2022=17 Goal status: Ongoing  3.  Pt will increase strength of B UE  by at least 1/2 MMT grade in order to demonstrate improvement in strength and function  Baseline: patient has range of 2-/5 to 4/5 BUE Strength; 12/02/2021= 4/5 except with wrist ext. 01/11/2022= 4/5 B UE strength expept for wrist Goal status: Partially met  ASSESSMENT:  CLINICAL IMPRESSION:  Patient presents with good motivation and able to progress with some strengthening- able to almost achieve full ROM with right knee ext and able to continue to progress with resistance with left LE to hopefully prepare her for upcoming standing/weight bearing activities. No significant improvement in hip strength as she continues to require some physical assist to achieve full ROM.  Pt will continue to benefit from skilled PT services to improve strength in anticipation for BLE clearance for Tool activities   OBJECTIVE IMPAIRMENTS Abnormal gait, decreased activity tolerance, decreased balance, decreased coordination, decreased endurance, decreased mobility, difficulty walking, decreased ROM, decreased strength, hypomobility, impaired flexibility, impaired UE functional use, postural dysfunction, and pain.   ACTIVITY LIMITATIONS carrying, lifting, bending, sitting, standing, squatting, sleeping, stairs, transfers, bed mobility, continence, bathing, toileting, dressing, self feeding, reach over head, hygiene/grooming, and caring  for others  PARTICIPATION LIMITATIONS: meal prep, cleaning, laundry, medication management, personal finances, interpersonal relationship, driving, shopping, community activity, and yard work  PERSONAL FACTORS Age, Time since onset of injury/illness/exacerbation, and 1-2 comorbidities: HTN, cervical Sx  are also affecting patient's functional outcome.   REHAB POTENTIAL: Good  CLINICAL DECISION MAKING: Evolving/moderate complexity  EVALUATION COMPLEXITY: Moderate  PLAN: PT  FREQUENCY: 1-2x/week  PT DURATION: 12 weeks  PLANNED INTERVENTIONS: Therapeutic exercises, Therapeutic activity, Neuromuscular re-education, Balance training, Gait training, Patient/Family education, Self Care, Joint mobilization, DME instructions, Dry Needling, Electrical stimulation, Wheelchair mobility training, Spinal mobilization, Cryotherapy, Moist heat, and Manual therapy  PLAN FOR NEXT SESSION: Continue with plan for LE strengthening while waiting for clearance to begin future weight bearing activities.    Ollen Bowl, PT 8:32 AM,01/21/22  Physical Therapist- [facility]  01/21/2022, 8:32 AM

## 2022-01-20 NOTE — Therapy (Signed)
OCCUPATIONAL THERAPY TREATMENT NOTE    Patient Name: Sherry [name] MRN: [id number] DOB:03-21-1934, 87 y.o., female Today's Date: 01/20/2022                                         PCP: Dr. Viviana Simpler REFERRING PROVIDER: Dr. Viviana Simpler   OT End of Session - 01/20/22 1150     Visit Number 29    Number of Visits 41    Date for OT Re-Evaluation 03/03/22    Authorization Time Period Progress report period starting 12/14/2021    OT Start Time 1100    OT Stop Time 1145    OT Time Calculation (min) 45 min    Activity Tolerance Patient tolerated treatment well    Behavior During Therapy [facility] for tasks assessed/performed                 Past Medical History:  Diagnosis Date   Acute blood loss anemia    Arthritis    Cancer ([place])    skin   Central cord syndrome at C6 level of cervical spinal cord ([place]) 11/29/2017   Hypertension    Protein-calorie malnutrition, severe ([place]) 01/24/2018   S/P insertion of IVC (inferior vena caval) filter 01/24/2018   Tetraparesis ([place])    Past Surgical History:  Procedure Laterality Date   ANTERIOR CERVICAL DECOMP/DISCECTOMY FUSION N/A 11/29/2017   Procedure: Cervical five-six, six-seven Anterior Cervical Decompression Fusion;  Surgeon: Judith Part, MD;  Location: [place];  Service: Neurosurgery;  Laterality: N/A;  Cervical five-six, six-seven Anterior Cervical Decompression Fusion   CATARACT EXTRACTION     FEMUR IM NAIL Left 08/16/2021   Procedure: INTRAMEDULLARY (IM) NAIL FEMORAL;  Surgeon: Earnestine Leys, MD;  Location: [facility];  Service: Orthopedics;  Laterality: Left;   IR IVC FILTER PLMT / S&I /IMG GUID/MOD SED  12/08/2017   Patient Active Problem List   Diagnosis Date Noted   Closed hip fracture ([place]) 08/15/2021   DVT (deep venous thrombosis) ([place]) 08/15/2021   Quadriplegia ([place]) 08/15/2021   Fall 08/15/2021   Constipation due to slow transit 08/31/2018   Trauma 06/05/2018   Neuropathic pain 06/05/2018   Neurogenic  bowel 06/05/2018   Vaginal yeast infection 01/30/2018   Healthcare-associated pneumonia 01/25/2018   Chronic allergic rhinitis 01/24/2018   Depression with anxiety 01/24/2018   UTI due to Klebsiella species 01/24/2018   Protein-calorie malnutrition, severe ([place]) 01/24/2018   S/P insertion of IVC (inferior vena caval) filter 01/24/2018   Tetraparesis ([place]) 01/20/2018   Neurogenic bladder 01/20/2018   Reactive depression    Benign essential HTN    Acute postoperative anemia due to expected blood loss    Central cord syndrome at C6 level of cervical spinal cord ([place]) 11/29/2017   Allergy to alpha-gal 11/25/2016   SCC (squamous cell carcinoma) 04/08/2014    REFERRING DIAG: Central Cord Syndrome at C6 of the Cervical Spinal Cord, Fall with Bilateral Closed Hip Fractures, with ORIF repair with Intramedullary Nailing of the Left Hip Fracture.  THERAPY DIAG:  Muscle weakness (generalized)  Rationale for Evaluation and Treatment Rehabilitation  PERTINENT HISTORY: Patient s/p fall November 29, 2017 resulting in diagnosis of central cord syndrome at C 6 level.  She has had therapy in multiple venues but with recent move to [facility], therapy staff recommended she seek outpatient therapy for her needs. Pt. Had a recent fall with Bilateral Closed  Hip Fractures, with ORIF repair with Intramedullary Nailing of the Left Hip Fracture.   PRECAUTIONS: None  SUBJECTIVE:   Pt. Reports having had a nice weekend at a family holiday gathering.  PAIN:  Are you having pain? No     OBJECTIVE:   TODAY'S TREATMENT:   Manual Therapy:   Pt. tolerated soft tissue massage to the volar, and dorsal surface of each digit on the bilateral hands secondary to stiffness. Manual therapy was performed independent of, and in preparation for therapeutic Ex.   Therapeutic Ex.    Pt. worked on [employer], and reciprocal motion using the UBE while seated for 8 min. with minimal resistance. Constant  monitoring was provided. Pt. performed bilateral UE strengthening for bilateral shoulder flexion, chest presses, and elbow flexion, and extension. Pt. worked on bilateral UE strengthening. Pt. tolerated PROM followed by AROM bilateral wrist extension, PROM for bilateral digit MP, PIP, and DIP flexion, and extension, thumb radial, and palmar abduction.      Pt. continues to progress with AROM in right shoulder flexion, and bilateral UE strength. Pt. continues to try to engage her bilateral hands more during daily ADL, and IADL tasks. Pt. continues to tolerateThere. Ex. on the UBE, and manual therapy well today.  Pt. tolerated BUE strengthening exercises with the dowel, and yellow resistive theraband. Pt. Continues to be able to reach up in to her closet to access her jacket, and donn it over both arms in reverse position with the back facing the front. Pt. Continues to be able to brush her teeth with assist for set-up, Pt. is independent with feeding herself more efficiently using standard utensils. Pt. is able to water the cactus in her husband's room while holding and using a cup. Pt. is able to hold a coffee cup, and drink from it. Pt. reports writing more on her daily menus, however writing continues to appear "shaky". Pt. Continues to present with tightness at the bilateral thumb webspace, and bilateral wrist flexor tightness, as well as digit MP, PIP, and DIP extensor tightness which continues to limit her ability to complete ADL tasks efficiently. Pt. continues to benefit from working on impoving ROM in order to work towards increasing bilateral hand grasp on objects, and increase engagement of her bilateral hands during ADLs, and IADL tasks.   PATIENT EDUCATION: Education details: BUE ROM, hand function, UE functioning Person educated: Patient Education method: Explanation and Demonstration Education comprehension: verbalized understanding, returned demonstration, and needs further education   HOME  EXERCISE PROGRAM Continue with ongoing HEPs  Measurements:  12/10/2022:  Shoulder flexion: R: 120(136), L: 138(150) Shoulder abduction: R: 108(120), L: 110(120) Elbow: R: 0-148, L: 0-146 Wrist flexion: R: 55(62), L: 78 Writ extension: R: 34(55), L: 3(4) RD: R: 12(20), L: 16(26) UD: R: 8(24), L: 6(18) Thumb radial abduction: R: 11(20), L: 8(20) Digit flexion to the [facility]: R:  2nd: 5cm(3.5cm), 3rd: 7.5 cm(5cm), 4th: 7cm(3cm), 5th: 6cm(5.5cm) L: 2nd: 8cm(8cm), 3rd: 8cm(8cm), 4th: 7.5cm(7cm), 5th: 7cm(7cm)            OT Long Term Goals - 07/06/21 1106  Target Date: 03/03/2022    OT LONG TERM GOAL #1   Title Patient and caregiver will demonstrate understanding of home exercise program for ROM.    Baseline 12/09/2021: Pt. Reports the restorative ROM program has not resumed at the facility. 10/28/2021: Pt. Is attempting exercises/stretches at home. The facility restorative aides have not resumed exercises. 09/14/2021:  Pt. Reports inconsistencies with restorative ROM at  her care home. Pt/caregiver education to be provided about ongoing ROM needs.    Time 12    Period Weeks    Status Deferred     OT LONG TERM GOAL #2   Title Pt.  Will improve BUE strength to be able to sustain her UE's in elevation to efficiently, and effectively perform hair care, and self-grooming tasks.   Baseline 12/09/2021: right shoulder 4-/5, left shoulder 4/5. Pt. Has improved to  be able to reach up for hair care, and self-grooming tasks. 10/28/2021: Right shoulder 3+/5, Left shoulder 4-/5 09/14/2021:  BUE shoulder strength: 3+/5 for shoulder flexion, and abduction.   Time 12    Period Weeks    Status Ongoing               OT LONG TERM GOAL #3   Title Patient will demonstrate improved composite finger flexion to hold deodorant to apply to underarms.    Baseline 12/09/2021: Bilateral hand/digit MP, PIP, and DIP extension  tightness limits her ability achieve digit flexion to hold, and apply deodorant. 10/28/2021:  Pt. Continues to have difficulty holding the deodorant. 01/12/2021: Pt. Presents with limited digit extension. Pt. Is able to initiate holding deodorant, however is unable to hold it while using it.   Time 12    Period Weeks    Status Ongoing   Target Date                  OT LONG TERM GOAL #4   Title Pt. Will improve bilateral wrist extension in preparation for anticipating, and initiating reaching for objects at the table.    Baseline 12/09/2021: Right  34(55)  Left  3(4) 10/28/2021:  Right 22(38), Left 0(15) 09/14/2021:  Right: 17(35), left 2(15)   Time 12   Period weeks   Status Ongoing   Target Date      OT LONG TERM GOAL #5   Title Pt. will write, and sign her name with 100% legibility, and modified independence.    Baseline 12/09/2021: Pt. Reports legibility appears "shaky" when attempting signing her name. Pt.reports that she is writing more when filling out her menu. 10/28/2021: pt. is able to sign her name, however legibility continues to be inconsistent. Pt. Reports "shaky appearance". 09/14/2021: Pt. continues to fill out her daily menu and complete puzzles. Pt. Continues to present with limited writing legibility.   Time 12    Period Weeks    Status  New          OT LONG TERM GOAL  #6   TITLE Pt. will turn pages in a book with modified independence.    Baseline 10/28/2021: Pt. Is able to consistently turn book pages. 09/14/2021: Pt. has difficulty turning pages.    Time 12    Period Weeks    Status Achieved       OT LONG TERM GOAL  #7  TITLE Pt. Will increase  bilateral lateral pinch strength by 2 lbs to be able to securely grasp items during ADLs, and IADL tasks.   Baseline 12/09/2021:  Right: 6#, Left: 4#  Time 12  Period Weeks  Status New             Plan - 07/22/21 1737     Clinical Impression Statement Pt. continues to progress with AROM in right shoulder flexion,  and bilateral UE strength. Pt. continues to try to engage her bilateral hands more during daily ADL, and IADL tasks. Pt. continues to tolerateThere. Ex. on the UBE, and manual therapy well today.  Pt. tolerated BUE strengthening exercises with the dowel, and yellow resistive theraband. Pt. Continues to be able to reach up in to her closet to access her jacket, and donn it over both arms in reverse position with the back facing the front. Pt. Continues to be able to brush her teeth with assist for set-up, Pt. is independent with feeding herself more efficiently using standard utensils. Pt. is able to water the cactus in her husband's room while holding and using a cup. Pt. is able to hold a coffee cup, and drink from it. Pt. reports writing more on her daily menus, however writing continues to appear "shaky". Pt. Continues to present with tightness at the bilateral thumb webspace, and bilateral wrist flexor tightness, as well as digit MP, PIP, and DIP extensor tightness which continues to limit her ability to complete ADL tasks efficiently. Pt. continues to benefit from working on impoving ROM in order to work towards increasing bilateral hand grasp on objects, and increase engagement of her bilateral hands during ADLs, and IADL tasks.s.                   OT Occupational Profile and History Detailed Assessment- Review of Records and additional review of physical, cognitive, psychosocial history related to current functional performance    Occupational performance deficits (Please refer to evaluation for details): ADL's;IADL's;Leisure;Social Participation    Body Structure / Function / Physical Skills ADL;Continence;Dexterity;Flexibility;Strength;ROM;Balance;Coordination;FMC;IADL;Endurance;UE functional use;Decreased knowledge of use of DME;[facility]    Psychosocial Skills Coping Strategies    Rehab Potential Fair    Clinical Decision Making Several treatment options, min-mod task modification necessary     Comorbidities Affecting Occupational Performance: Presence of comorbidities impacting occupational performance    Comorbidities impacting occupational performance description: contractures of bilateral hands, dependent transfers,    Modification or Assistance to Complete Evaluation  No modification of tasks or assist necessary to complete eval    OT Frequency 2x / week    OT Duration 12 weeks    OT Treatment/Interventions Self-care/ADL training;Cryotherapy;Therapeutic exercise;DME and/or AE instruction;Balance training;Neuromuscular education;Manual Therapy;Splinting;Moist Heat;Passive range of motion;Therapeutic activities;Patient/family education    Plan continue to progress ROM of digits, wrists and shoulders as it pertains to completion of ADL tasks that pt values.    Consulted and Agree with Plan of Care Patient            Harrel Carina, MS, OTR/L  Harrel Carina, OT 01/20/2022, 11:57 AM      `

## 2022-01-27 ENCOUNTER — Ambulatory Visit: Payer: Medicare HMO

## 2022-01-27 ENCOUNTER — Ambulatory Visit: Payer: Medicare HMO | Admitting: Occupational Therapy

## 2022-01-27 NOTE — Therapy (Incomplete)
OUTPATIENT PHYSICAL THERAPY NEURO TREATMENT   Patient Name: Sherry [name] MRN: [id number] DOB:06-07-1934, 87 y.o., female Today's Date: 01/27/2022   PCP: Dewayne Shorter, MD REFERRING PROVIDER: Earnestine Leys               Past Medical History:  Diagnosis Date   Acute blood loss anemia    Arthritis    Cancer ([place])    skin   Central cord syndrome at C6 level of cervical spinal cord ([place]) 11/29/2017   Hypertension    Protein-calorie malnutrition, severe ([place]) 01/24/2018   S/P insertion of IVC (inferior vena caval) filter 01/24/2018   Tetraparesis ([place])    Past Surgical History:  Procedure Laterality Date   ANTERIOR CERVICAL DECOMP/DISCECTOMY FUSION N/A 11/29/2017   Procedure: Cervical five-six, six-seven Anterior Cervical Decompression Fusion;  Surgeon: Judith Part, MD;  Location: [facility];  Service: Neurosurgery;  Laterality: N/A;  Cervical five-six, six-seven Anterior Cervical Decompression Fusion   CATARACT EXTRACTION     FEMUR IM NAIL Left 08/16/2021   Procedure: INTRAMEDULLARY (IM) NAIL FEMORAL;  Surgeon: Earnestine Leys, MD;  Location: [facility];  Service: Orthopedics;  Laterality: Left;   IR IVC FILTER PLMT / S&I /IMG GUID/MOD SED  12/08/2017   Patient Active Problem List   Diagnosis Date Noted   Closed hip fracture ([place]) 08/15/2021   DVT (deep venous thrombosis) ([place]) 08/15/2021   Quadriplegia ([place]) 08/15/2021   Fall 08/15/2021   Constipation due to slow transit 08/31/2018   Trauma 06/05/2018   Neuropathic pain 06/05/2018   Neurogenic bowel 06/05/2018   Vaginal yeast infection 01/30/2018   Healthcare-associated pneumonia 01/25/2018   Chronic allergic rhinitis 01/24/2018   Depression with anxiety 01/24/2018   UTI due to Klebsiella species 01/24/2018   Protein-calorie malnutrition, severe ([place]) 01/24/2018   S/P insertion of IVC (inferior vena caval) filter 01/24/2018   Tetraparesis ([place]) 01/20/2018   Neurogenic bladder 01/20/2018   Reactive  depression    Benign essential HTN    Acute postoperative anemia due to expected blood loss    Central cord syndrome at C6 level of cervical spinal cord ([place]) 11/29/2017   Allergy to alpha-gal 11/25/2016   SCC (squamous cell carcinoma) 04/08/2014    ONSET DATE: 08/15/2021 (fall with B hip fx); Initial injury was in 2019- quadraplegia due to central cord syndrom of C6 secondary to neck fracture.   REFERRING DIAG: Bilateral Hip fx; Left Open- s/p Left IM nail femoral on 08/16/2021; Right closed  THERAPY DIAG:  No diagnosis found.  Rationale for Evaluation and Treatment Rehabilitation  SUBJECTIVE:  SUBJECTIVE STATEMENT:    Patient reported no changes since last visit. Feels like she is getting stronger and likes the resistance she is now able to perform. She continues to deny pain       Pt accompanied by:  Paid caregiver-   PERTINENT HISTORY: Patient was previous patient (see previous episode for specifics) She experienced a fall at home on 08/15/2021 and was in [facility] from 08/15/2021- 08/20/2021.  Most recent history per Dr. Laurey Arrow on 08/20/2021.  Sherry [name] is a 87 y.o. 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   Patient has history of quadriplegia, and has been doing PT/OT with improvement of function. Pt has some use of both arms and can feed herself. She can walk a little bit with assistance. Pt accidentally fell at about 930 this morning after taking shower.  She injured her hips. Hospital Course:    Closed left hip fracture [facility]) s/p mechanical fall Closed  right hip fracture (?old) Acute blood loss anemia --pt is s/p  INTRAMEDULLARY (IM) NAIL FEMORAL-left on 7/16  PAIN:  Are you having pain? NO: NPRS scale: 0/10 Pain location: Right hip Pain description: Ache Aggravating factors: any active Movement of right side Relieving factors: Rest  PRECAUTIONS: Fall  WEIGHT BEARING RESTRICTIONS Yes Bilateral non-weight bearing B LE's  FALLS: Has patient fallen in last 6 months? Yes. Number of falls 1  LIVING ENVIRONMENT: Lives with: lives with their family and lives in a nursing home Lives in: Other Long term care at [facility] [place]: No Has following equipment at home: Wheelchair (power), Ramped entry, and hoyer lift  PLOF: Needs assistance with ADLs, Needs assistance with homemaking, Needs assistance with gait, Needs assistance with transfers, and Leisure: Work puzzles, read, Watch TV  PATIENT GOALS Patient goal is to walk; Be able to strengthen my arms for better use  OBJECTIVE:   DIAGNOSTIC FINDINGS: Overall cognitive statusEXAM: DG HIP (WITH OR WITHOUT PELVIS) 2-3V LEFT; DG HIP (WITH OR WITHOUT PELVIS) 2-3V RIGHT   COMPARISON:  CT of the abdomen and pelvis 01/21/2020   FINDINGS: Left proximal femur fracture noted just below the intertrochanteric region. Marked ventral angulation noted on the cross-table image. Slight varus angulation present   Comminuted intratrochanteric fracture present on the right also with marked ventral angulation.   IMPRESSION: 1. Bilateral hip fractures. 2. Comminuted intertrochanteric fracture of the right hip. 3. Angulated fracture of the proximal left femur just below the intertrochanteric region.     Electronically Signed   By: San Morelle M.D.   On: 08/15/2021 11:54:   COGNITION:  Within functional limits for tasks assessed   SENSATION: Light touch: Impaired   EDEMA:  None  Strength R/L 2-/2- Shoulder flexion (anterior deltoid/pec major/coracobrachialis, axillary n.  (C5-6) and musculocutaneous n. (C5-7)) 2-/3+ (within available ROM around 130 on lef)Shoulder abduction (deltoid/supraspinatus, axillary/suprascapular n, C5) 3+/3+ Shoulder external rotation (infraspinatus/teres minor) 4/4 Shoulder internal rotation (subcapularis/lats/pec major) 3+/3+ Shoulder extension (posterior deltoid, lats, teres major, axillary/thoracodorsal n.) 3+/3+ Shoulder horizontal abduction 4/4 Elbow flexion (biceps brachii, brachialis, brachioradialis, musculoskeletal n, C5-6) 4/4 Elbow extension (triceps, radial n, C7) 2-/2- Wrist Extension 3+/3+ Wrist Flexion   LOWER EXTREMITY MMT:    MMT Right Eval Left Eval  Hip flexion    Hip extension    Hip abduction  Hip adduction    Hip internal rotation    Hip external rotation    Knee flexion    Knee extension    Ankle dorsiflexion    Ankle plantarflexion    Ankle inversion    Ankle eversion    (Blank rows = not tested) - BLE not tested secondary to weight bearing precaution of B LE NWB  BED MOBILITY:  Unable to test due to NWB status of BLE- Patient currently dependent with all bed mobility using hoyer and caregiver assist  TRANSFERS: Currently BLE NWB- Dependent on use of hoyer for all transfers  FUNCTIONAL TESTs:   PATIENT SURVEYS:  FOTO 12  TODAY'S TREATMENT:   -COPIED FROM [facility]-DR. H. MILLER note from 12/30/2021 12/30/2021 12/30/2021 Assessment and Plan:  Impression: 1. Bilateral hip fractures.  Recommendation: The patient will continue the current program until 02/01/2022 when she may try some weightbearing exercises as well. The patient will follow up in 03/2022 for exam and x-ray of both hips.  ATTESTATION:  This note has been generated by Romero Liner and edited by Christiana Fuchs, Quality Documentation Specialist.     - COPIED FROM [facility]- DR. Jonny Ruiz Note from 11/18/2021:  11/18/2021 11/18/2021 Assessment and Plan:  Bilateral hip fractures.   Recommendation:  The  patient's PT may start with range of motion and more activity with the hips. I would like to avoid weightbearing still. We will see her back here in 6 weeks for an exam and x-ray of both hips.    THEREX: In power w/c (due to B NWB LE status)   PROM to Left and Right hamstring/gastroc/hip flex x 30 sec x 3 each LE  -AAROM Hip flex  x 15 reps each LE x 2 sets  -Hip abduction -AAROM 2 sets of 15 reps each LE  -Hip adduction with ball squeeze hold 5 sec x 15 reps each.   -Seated LAQ- no weight on right LE x 8 reps (attempting to achieve as full ROM as possible- 3 sets of 8 reps with no assist)   And for left LE:   1 set of 12 reps with 3lb AW  1 set of 10 reps with 4lb AW  1 set of 6 reps with 5lb  AW  1 set of 6 reps with 5 lb AW   -Resistive ham curl with YTB x 15 reps - Reclined hip abd with knee straight x 15 reps each LE x 2 sets  MMT Right 01/11/22 Left 01/11/22  Hip flexion 2- 2-  Hip extension 2- 2-  Hip abduction 2- 2-  Hip adduction 2- 3  Hip internal rotation 2- 2-  Hip external rotation 2- 2-  Knee flexion 2- 2-  Knee extension 2+ 3+  Ankle dorsiflexion 2- NT - boot  Ankle plantarflexion 2- NT- boot  Ankle inversion 2- NT- boot  Ankle eversion 2- NT- boot  (Blank rows = not tested)     PATIENT EDUCATION: Education details: PT plan of care/use of resistive bands Person educated: Patient Education method: Explanation, Demonstration, Tactile cues, and Verbal cues Education comprehension: verbalized understanding, returned demonstration, verbal cues required, tactile cues required, and needs further education   HOME EXERCISE PROGRAM: Discussed use of theraband and demo shoulder flex/abd and elbow flex and ext with use of yellow TB. Patient required assist to position therband but able to perform well with only one person assist  Access Code: [facility] URL: [email or web address] Date: 11/23/2021 Prepared by: Janna Arch Exercises - Seated  Gluteal  Sets - 2 x daily - 7 x weekly - 2 sets - 10 reps - 5 hold - Seated Quad Set - 2 x daily - 7 x weekly - 2 sets - 10 reps - 5 hold - Seated Long Arc Quad - 2 x daily - 7 x weekly - 2 sets - 10 reps - 5 hold - Seated March - 2 x daily - 7 x weekly - 2 sets - 10 reps - 5 hold - Seated Hip Abduction - 2 x daily - 7 x weekly - 2 sets - 10 reps - 5 hold - Seated Shoulder Shrugs - 2 x daily - 7 x weekly - 2 sets - 10 reps - 5 hold - Wheelchair Pressure Relief - 2 x daily - 7 x weekly - 2 sets - 10 reps - 5 hold  GOALS: Goals reviewed with patient? Yes  SHORT TERM GOALS: Target date: 02/22/2022  Pt will be independent with initial UE strengthening HEP in order to improve strength and balance in order to decrease fall risk and improve function at home and work. Baseline: 10/19/2021- patient with no formal UE HEP; 12/02/2021= Patient verbalized knowledge of HEP including use of theraband for UE strengthening.  Goal status: GOAL MET  LONG TERM GOALS: Target date: 04/05/2022  Pt will be independent with final for UE/LE HEP in order to improve strength and balance in order to decrease fall risk and improve function at home and work. Baseline: Patient is currently BLE NWB and unable to participate in HEP. Has order for UE strengthening. 01/11/2022- Patient now able to participate in LE strengthening although NWB still- good understanding for some basic exercises. Will keep goal active to incorporate progressive LE strengthening exercises. Goal status: Progressing  2.  Pt will improve FOTO to target score of 35  to display perceived improvements in ability to complete ADL's.  Baseline: 10/19/2021= 12; 12/02/2021= 17; 01/11/2022=17 Goal status: Ongoing  3.  Pt will increase strength of B UE  by at least 1/2 MMT grade in order to demonstrate improvement in strength and function  Baseline: patient has range of 2-/5 to 4/5 BUE Strength; 12/02/2021= 4/5 except with wrist ext. 01/11/2022= 4/5 B UE strength expept for  wrist Goal status: Partially met  ASSESSMENT:  CLINICAL IMPRESSION:  Patient presents with good motivation and able to progress with some strengthening- able to almost achieve full ROM with right knee ext and able to continue to progress with resistance with left LE to hopefully prepare her for upcoming standing/weight bearing activities. No significant improvement in hip strength as she continues to require some physical assist to achieve full ROM.  Pt will continue to benefit from skilled PT services to improve strength in anticipation for BLE clearance for [place] activities   OBJECTIVE IMPAIRMENTS Abnormal gait, decreased activity tolerance, decreased balance, decreased coordination, decreased endurance, decreased mobility, difficulty walking, decreased ROM, decreased strength, hypomobility, impaired flexibility, impaired UE functional use, postural dysfunction, and pain.   ACTIVITY LIMITATIONS carrying, lifting, bending, sitting, standing, squatting, sleeping, stairs, transfers, bed mobility, continence, bathing, toileting, dressing, self feeding, reach over head, hygiene/grooming, and caring for others  PARTICIPATION LIMITATIONS: meal prep, cleaning, laundry, medication management, personal finances, interpersonal relationship, driving, shopping, community activity, and yard work  PERSONAL FACTORS Age, Time since onset of injury/illness/exacerbation, and 1-2 comorbidities: HTN, cervical Sx  are also affecting patient's functional outcome.   REHAB POTENTIAL: Good  CLINICAL DECISION MAKING: Evolving/moderate complexity  EVALUATION COMPLEXITY: Moderate  PLAN: PT FREQUENCY:  1-2x/week  PT DURATION: 12 weeks  PLANNED INTERVENTIONS: Therapeutic exercises, Therapeutic activity, Neuromuscular re-education, Balance training, Gait training, Patient/Family education, Self Care, Joint mobilization, DME instructions, Dry Needling, Electrical stimulation, Wheelchair mobility training, Spinal  mobilization, Cryotherapy, Moist heat, and Manual therapy  PLAN FOR NEXT SESSION: Continue with plan for LE strengthening while waiting for clearance to begin future weight bearing activities.    Ollen Bowl, PT 8:23 AM,01/27/22  Physical Therapist- [facility]  01/27/2022, 8:23 AM

## 2022-02-03 ENCOUNTER — Ambulatory Visit: Payer: Medicare HMO

## 2022-02-03 ENCOUNTER — Ambulatory Visit: Payer: Medicare HMO | Attending: Internal Medicine | Admitting: Occupational Therapy

## 2022-02-03 DIAGNOSIS — S72001A Fracture of unspecified part of neck of right femur, initial encounter for closed fracture: Secondary | ICD-10-CM

## 2022-02-03 DIAGNOSIS — R2681 Unsteadiness on feet: Secondary | ICD-10-CM | POA: Insufficient documentation

## 2022-02-03 DIAGNOSIS — S72002A Fracture of unspecified part of neck of left femur, initial encounter for closed fracture: Secondary | ICD-10-CM | POA: Insufficient documentation

## 2022-02-03 DIAGNOSIS — R269 Unspecified abnormalities of gait and mobility: Secondary | ICD-10-CM | POA: Insufficient documentation

## 2022-02-03 DIAGNOSIS — R262 Difficulty in walking, not elsewhere classified: Secondary | ICD-10-CM | POA: Diagnosis present

## 2022-02-03 DIAGNOSIS — S14129S Central cord syndrome at unspecified level of cervical spinal cord, sequela: Secondary | ICD-10-CM

## 2022-02-03 DIAGNOSIS — M6281 Muscle weakness (generalized): Secondary | ICD-10-CM

## 2022-02-03 DIAGNOSIS — R278 Other lack of coordination: Secondary | ICD-10-CM

## 2022-02-03 DIAGNOSIS — R2689 Other abnormalities of gait and mobility: Secondary | ICD-10-CM | POA: Diagnosis present

## 2022-02-03 NOTE — Therapy (Signed)
Occupational Therapy Progress/Recertification Note  Dates of reporting period  12/14/2021   to   02/03/2022    Patient Name: Sherry [name] MRN: [id number] DOB:[birth year], 88 y.o., female Today's Date: 02/03/2022                                         PCP: Dr. Viviana Simpler REFERRING PROVIDER: Dr. Viviana Simpler   OT End of Session - 02/03/22 1112     Visit Number 30    Number of Visits 29    Date for OT Re-Evaluation 04/28/22    Authorization Time Period Progress report period starting 12/14/2021    OT Start Time 1105    OT Stop Time 1145    OT Time Calculation (min) 40 min    Activity Tolerance Patient tolerated treatment well    Behavior During Therapy [facility] for tasks assessed/performed                 Past Medical History:  Diagnosis Date   Acute blood loss anemia    Arthritis    Cancer ([place])    skin   Central cord syndrome at C6 level of cervical spinal cord ([place]) 11/29/2017   Hypertension    Protein-calorie malnutrition, severe ([place]) 01/24/2018   S/P insertion of IVC (inferior vena caval) filter 01/24/2018   Tetraparesis ([place])    Past Surgical History:  Procedure Laterality Date   ANTERIOR CERVICAL DECOMP/DISCECTOMY FUSION N/A 11/29/2017   Procedure: Cervical five-six, six-seven Anterior Cervical Decompression Fusion;  Surgeon: Judith Part, MD;  Location: [facility];  Service: Neurosurgery;  Laterality: N/A;  Cervical five-six, six-seven Anterior Cervical Decompression Fusion   CATARACT EXTRACTION     FEMUR IM NAIL Left 08/16/2021   Procedure: INTRAMEDULLARY (IM) NAIL FEMORAL;  Surgeon: Earnestine Leys, MD;  Location: [facility];  Service: Orthopedics;  Laterality: Left;   IR IVC FILTER PLMT / S&I /IMG GUID/MOD SED  12/08/2017   Patient Active Problem List   Diagnosis Date Noted   Closed hip fracture ([place]) 08/15/2021   DVT (deep venous thrombosis) ([place]) 08/15/2021   Quadriplegia ([place]) 08/15/2021   Fall 08/15/2021   Constipation due to slow transit  08/31/2018   Trauma 06/05/2018   Neuropathic pain 06/05/2018   Neurogenic bowel 06/05/2018   Vaginal yeast infection 01/30/2018   Healthcare-associated pneumonia 01/25/2018   Chronic allergic rhinitis 01/24/2018   Depression with anxiety 01/24/2018   UTI due to Klebsiella species 01/24/2018   Protein-calorie malnutrition, severe ([place]) 01/24/2018   S/P insertion of IVC (inferior vena caval) filter 01/24/2018   Tetraparesis ([place]) 01/20/2018   Neurogenic bladder 01/20/2018   Reactive depression    Benign essential HTN    Acute postoperative anemia due to expected blood loss    Central cord syndrome at C6 level of cervical spinal cord ([place]) 11/29/2017   Allergy to alpha-gal 11/25/2016   SCC (squamous cell carcinoma) 04/08/2014    REFERRING DIAG: Central Cord Syndrome at C6 of the Cervical Spinal Cord, Fall with Bilateral Closed Hip Fractures, with ORIF repair with Intramedullary Nailing of the Left Hip Fracture.  THERAPY DIAG:  Muscle weakness (generalized)  Rationale for Evaluation and Treatment Rehabilitation  PERTINENT HISTORY: Patient s/p fall November 29, 2017 resulting in diagnosis of central cord syndrome at C 6 level.  She has had therapy in multiple venues but with recent move to [facility], therapy staff recommended she seek  outpatient therapy for her needs. Pt. Had a recent fall with Bilateral Closed Hip Fractures, with ORIF repair with Intramedullary Nailing of the Left Hip Fracture.   PRECAUTIONS: None  SUBJECTIVE:   Pt. Reports having had a nice nice New Year  PAIN:  Are you having pain? No     OBJECTIVE:   TODAY'S TREATMENT:   Measurements were obtained, and goals were reviewed with the Pt. Pt. has made consistent progress with Bilateral UE ROM over this progress reporting period. Pt. is now able to sustain her Bilateral UE's in elevation long enough to efficiently perform hair, and grooming care. Pt. Is progressing towards being able to formulate a fist to  securely hold items during self-care. Pt. Has improved with right wrist extension in preparation for, and in anticipation of reaching. Pt. Is able to consistently turn pages of a book. Pt. Continues to work on improving BUE functioning in order to improve writing legibility, manipulating buttons and zippers, accessing and completing plant care, and reviewing adaptive techniques to improve efficiency of morning care dressing tasks.   PATIENT EDUCATION: Education details: BUE ROM, hand function, UE functioning Person educated: Patient Education method: Explanation and Demonstration Education comprehension: verbalized understanding, returned demonstration, and needs further education   HOME EXERCISE PROGRAM Continue with ongoing HEPs  Measurements:  12/09/2021:  Shoulder flexion: R: 120(136), L: 138(150) Shoulder abduction: R: 108(120), L: 110(120) Elbow: R: 0-148, L: 0-146 Wrist flexion: R: 55(62), L: 78 Writ extension: R: 34(55), L: 3(4) RD: R: 12(20), L: 16(26) UD: R: 8(24), L: 6(18) Thumb radial abduction: R: 11(20), L: 8(20) Digit flexion to [facility]: R:  2nd: 5cm(3.5cm), 3rd: 7.5 cm(5cm), 4th: 7cm(3cm), 5th: 6cm(5.5cm) L: 2nd: 8cm(8cm), 3rd: 8cm(8cm), 4th: 7.5cm(7cm), 5th: 7cm(7cm)  02/03/2022:   Shoulder flexion: R: 122(138), L: 148(154) Shoulder abduction: R: 109(125), L: 125(125 Elbow: R: 0-148, L: 0-146 Wrist flexion: R: 60(68), L: 79 Writ extension: R: 40(55), L: 3(10) RD: R: 14(24), L: 20(28) UD: R: 8(24), L: 6(18) Thumb radial abduction: R: 20(25), L: 8(20) Digit flexion to the [facility]: R:  2nd: 4.5cm(3cm), 3rd: 6.5cm(4.5cm), 4th: 6.5 cm(3cm), 5th: 4.5cm(5cm) L: 2nd: 8cm(8cm), 3rd: 8cm(7cm), 4th: 7.5cm(7cm), 5th: 6.5cm(6.5cm)            OT Long Term Goals - 07/06/21 1106                                                                                          Target Date: 04/28/2022    OT LONG TERM GOAL #1   Title Patient and caregiver will demonstrate  understanding of home exercise program for ROM.    Baseline 12/09/2021: Pt. Reports the restorative ROM program has not resumed at the facility. 10/28/2021: Pt. Is attempting exercises/stretches at home. The facility restorative aides have not resumed exercises. 09/14/2021:  Pt. Reports inconsistencies with restorative ROM at  her care home. Pt/caregiver education to be provided about ongoing ROM needs.    Time 12    Period Weeks    Status Deferred     OT LONG TERM GOAL #2   Title Pt.  will improve BUE strength to be able to sustain her  UE's in elevation to efficiently, and effectively perform hair care, and self-grooming tasks.   Baseline 02/03/2022: Pt. has improved, and is now able to sustain her Bilateral UEs in elevation long enough to efficiently, and effectively perform haircare, and self-grooming tasks. 12/09/2021: right shoulder 4-/5, left shoulder 4/5. Pt. has improved to be able to reach up for hair care, and self-grooming tasks. 10/28/2021: Right shoulder 3+/5, Left shoulder 4-/5 09/14/2021:  BUE shoulder strength: 3+/5 for shoulder flexion, and abduction.   Time 12    Period Weeks    Status Achieved               OT LONG TERM GOAL #3   Title Patient will demonstrate improved composite finger flexion to hold deodorant to apply to underarms.    Baseline 02/03/2022: Pt. Has improved with digit flexion, however continues to have difficulty securely holding and using deoderant. 12/09/2021: Bilateral hand/digit MP, PIP, and DIP extension tightness limits her ability achieve digit flexion to hold, and apply deodorant. 10/28/2021:  Pt. Continues to have difficulty holding the deodorant. 01/12/2021: Pt. Presents with limited digit extension. Pt. Is able to initiate holding deodorant, however is unable to hold it while using it.   Time 12    Period Weeks    Status Ongoing   Target Date                  OT LONG TERM GOAL #4   Title Pt. Will improve bilateral wrist extension in preparation for  anticipating, and initiating reaching for objects at the table.    Baseline 02/03/2022: Right: 34(55) Left: 3(4) 12/09/2021: Right  34(55)  Left  3(4) 10/28/2021:  Right 22(38), Left 0(15) 09/14/2021:  Right: 17(35), left 2(15)   Time 12   Period weeks   Status Ongoing   Target Date      OT LONG TERM GOAL #5   Title Pt. will write, and sign her name with 100% legibility, and modified independence.    Baseline 02/03/2022: Pt. Continues to report legibility appears "shaky" when attempting signing her name. Pt.reports that she is writing more when filling out her menu, and complete puzzles.12/09/2021: Pt. Reports legibility appears "shaky" when attempting signing her name. Pt.reports that she is writing more when filling out her menu. 10/28/2021: pt. is able to sign her name, however legibility continues to be inconsistent. Pt. Reports "shaky appearance". 09/14/2021: Pt. continues to fill out her daily menu and complete puzzles. Pt. Continues to present with limited writing legibility.   Time 12    Period Weeks    Status  Ongoing         OT LONG TERM GOAL  #6   TITLE Pt. will turn pages in a book with modified independence.    Baseline 02/03/2022: Pt. Is able to effectively turn pages of a book. 10/28/2021: Pt. Is able to consistently turn book pages. 09/14/2021: Pt. has difficulty turning pages.    Time 12    Period Weeks    Status Achieved       OT LONG TERM GOAL  #7  TITLE Pt. Will increase  bilateral lateral pinch strength by 2 lbs to be able to securely grasp items during ADLs, and IADL tasks.   Baseline 02/03/2022: Right: 6#, Left: 4# 12/09/2021:  Right: 6#, Left: 4#   Time 12  Period Weeks  Status Ongoing           OT LONG TERM GOAL  #8  TITLE Pt. Will button a sweater  with large 1" buttons, and zip her jacket with modified independence.   Baseline 02/03/2022: Pt. is unable to manipulate buttons on her sweater, or zippers on her jacket.  Time 12  Period Weeks  Status  New   OT LONG  TERM GOAL  #9  TITLE Pt. will complete plant care with modified independence.  Baseline 02/03/2022: Pt. has difficulty caring for her plants.   Time 12  Period Weeks  Status New   OT LONG TERM GOAL  #10  TITLE Pt. will demonstrate adaptive techniques to assist with the efficiency of self-dressing, or morning care tasks.  Baseline 02/03/2022: Pt. Requires assist from caregivers 2/2 time limitations during morning care.   Time 12  Period Weeks  Status New     Plan - 07/22/21 1737     Clinical Impression Statement Measurements were obtained, and goals were reviewed with the Pt. Pt. has made consistent progress with Bilateral UE ROM over this progress reporting period. Pt. is now able to sustain her Bilateral UE's in elevation long enough to efficiently perform hair, and grooming care. Pt. Is progressing towards being able to formulate a fist to securely hold items during self-care. Pt. Has improved with right wrist extension in preparation for, and in anticipation of reaching. Pt. Is able to consistently turn pages of a book. Pt. Continues to work on improving BUE functioning in order to improve writing legibility, manipulating buttons and zippers, accessing and completing plant care, and reviewing adaptive techniques to improve efficiency of morning care dressing tasks.                    OT Occupational Profile and History Detailed Assessment- Review of Records and additional review of physical, cognitive, psychosocial history related to current functional performance    Occupational performance deficits (Please refer to evaluation for details): ADL's;IADL's;Leisure;Social Participation    Body Structure / Function / Physical Skills ADL;Continence;Dexterity;Flexibility;Strength;ROM;Balance;Coordination;FMC;IADL;Endurance;UE functional use;Decreased knowledge of use of DME;GMC    Psychosocial Skills Coping Strategies    Rehab Potential Fair    Clinical Decision Making Several treatment  options, min-mod task modification necessary    Comorbidities Affecting Occupational Performance: Presence of comorbidities impacting occupational performance    Comorbidities impacting occupational performance description: contractures of bilateral hands, dependent transfers,    Modification or Assistance to Complete Evaluation  No modification of tasks or assist necessary to complete eval    OT Frequency 2x / week    OT Duration 12 weeks    OT Treatment/Interventions Self-care/ADL training;Cryotherapy;Therapeutic exercise;DME and/or AE instruction;Balance training;Neuromuscular education;Manual Therapy;Splinting;Moist Heat;Passive range of motion;Therapeutic activities;Patient/family education    Plan continue to progress ROM of digits, wrists and shoulders as it pertains to completion of ADL tasks that pt values.    Consulted and Agree with Plan of Care Patient            Harrel Carina, MS, OTR/L  Harrel Carina, OT 02/03/2022, 11:59 AM      `

## 2022-02-04 ENCOUNTER — Encounter: Payer: Self-pay | Admitting: Nurse Practitioner

## 2022-02-04 ENCOUNTER — Non-Acute Institutional Stay (SKILLED_NURSING_FACILITY): Payer: Medicare HMO | Admitting: Nurse Practitioner

## 2022-02-04 DIAGNOSIS — N319 Neuromuscular dysfunction of bladder, unspecified: Secondary | ICD-10-CM | POA: Diagnosis not present

## 2022-02-04 DIAGNOSIS — F330 Major depressive disorder, recurrent, mild: Secondary | ICD-10-CM

## 2022-02-04 DIAGNOSIS — G8250 Quadriplegia, unspecified: Secondary | ICD-10-CM | POA: Diagnosis not present

## 2022-02-04 DIAGNOSIS — R601 Generalized edema: Secondary | ICD-10-CM

## 2022-02-04 DIAGNOSIS — I10 Essential (primary) hypertension: Secondary | ICD-10-CM

## 2022-02-04 DIAGNOSIS — M810 Age-related osteoporosis without current pathological fracture: Secondary | ICD-10-CM

## 2022-02-04 DIAGNOSIS — Z86718 Personal history of other venous thrombosis and embolism: Secondary | ICD-10-CM

## 2022-02-04 DIAGNOSIS — R21 Rash and other nonspecific skin eruption: Secondary | ICD-10-CM

## 2022-02-04 DIAGNOSIS — E43 Unspecified severe protein-calorie malnutrition: Secondary | ICD-10-CM

## 2022-02-04 DIAGNOSIS — M792 Neuralgia and neuritis, unspecified: Secondary | ICD-10-CM

## 2022-02-04 MED ORDER — TIZANIDINE HCL 2 MG PO TABS: 2.0000 mg | ORAL_TABLET | Freq: Two times a day (BID) | ORAL | Status: AC

## 2022-02-04 MED ORDER — TRIAMCINOLONE ACETONIDE 0.1 % EX CREA: 1.0000 | TOPICAL_CREAM | Freq: Two times a day (BID) | CUTANEOUS | 0 refills | Status: DC

## 2022-02-04 MED ORDER — GABAPENTIN 100 MG PO CAPS: 100.0000 mg | ORAL_CAPSULE | Freq: Every day | ORAL | Status: DC

## 2022-02-04 MED ORDER — SPIRONOLACTONE 25 MG PO TABS: 25.0000 mg | ORAL_TABLET | Freq: Every day | ORAL | Status: DC

## 2022-02-04 NOTE — Progress Notes (Signed)
Location:  Other [place].  Nursing Home Room Number: [place] Place of Service:  SNF [phone]) Provider:  Sherrie Mustache, NP   Patient Care Team: Dewayne Shorter, MD as PCP - [facility])  Extended Emergency Contact Information Primary Emergency Contact: [name] Mobile Phone: [phone] Relation: Daughter Secondary Emergency Contact: Lindi Adie Address: [address] [place]          [place], [place] [address] Johnnette Litter of [place] Phone: [phone] Relation: Spouse  Code Status:  Full Code  Goals of care: Advanced Directive information    02/04/2022   11:46 AM  Advanced Directives  Does Patient Have a Medical Advance Directive? Yes  Type of Advance Directive [place]  Does patient want to make changes to medical advance directive? No - Patient declined  Copy of [place] in Chart? Yes - validated most recent copy scanned in chart (See row information)     Chief Complaint  Patient presents with   Medical Management of Chronic Issues    Medical Management of Chronic Issues.    Acute Visit    Neck Rash    HPI:  Pt is a 88 y.o. female seen today for medical management of chronic diseases.  Pt with hx of alpha-gal, depression, anemia, OA, central cord syndrome, htn,   She goes to outpatient PT at [facility] she was able to stand yesterday for the first time in 5.5 months.  Reports she does have some muscle spasms but not a lot.  No pain.   Reports she has had itchy bumps on the back of her necks, comes and goes but has been going on for a long time.   Anxiety/depression- reports depression is control, continues on cymbalta.   Reports she has had a lot of gas and now has medication for this.   Anemia- tolerating iron well.   Hx of DVT in 2020- continues on xarelto  Osteoporosis- continues on cal and vit d with alendronate   Denies neuropathy- she was having pain in her hands but none at this  time  She reports fluid pill was increased after hospitalization when she had a lot of swelling now swelling has improved and feels like skin is very dry- questions if this can be decreased.     Past Medical History:  Diagnosis Date   Acute blood loss anemia    Arthritis    Cancer ([place])    skin   Central cord syndrome at C6 level of cervical spinal cord ([place]) 11/29/2017   Hypertension    Protein-calorie malnutrition, severe ([place]) 01/24/2018   S/P insertion of IVC (inferior vena caval) filter 01/24/2018   Tetraparesis ([place])    Past Surgical History:  Procedure Laterality Date   ANTERIOR CERVICAL DECOMP/DISCECTOMY FUSION N/A 11/29/2017   Procedure: Cervical five-six, six-seven Anterior Cervical Decompression Fusion;  Surgeon: Judith Part, MD;  Location: [facility];  Service: Neurosurgery;  Laterality: N/A;  Cervical five-six, six-seven Anterior Cervical Decompression Fusion   CATARACT EXTRACTION     FEMUR IM NAIL Left 08/16/2021   Procedure: INTRAMEDULLARY (IM) NAIL FEMORAL;  Surgeon: Earnestine Leys, MD;  Location: [facility] ORS;  Service: Orthopedics;  Laterality: Left;   IR IVC FILTER PLMT / S&I /IMG GUID/MOD SED  12/08/2017    Allergies  Allergen Reactions   Other Swelling   Lisinopril-Hydrochlorothiazide Swelling    Swelling of the tongue   Beef-Derived Products Swelling    Facial and lip swelling (due to tick  bite)   Dairy Aid [Tilactase] Swelling   Lisinopril-Hydrochlorothiazide Swelling    Tongue swelling   Pork-Derived Products Swelling    Outpatient Encounter Medications as of 02/04/2022  Medication Sig   acetaminophen (TYLENOL) 500 MG tablet Take 500 mg by mouth every 12 (twelve) hours as needed for mild pain.   acetaminophen (TYLENOL) 650 MG CR tablet Take 650 mg by mouth every 8 (eight) hours as needed for pain.   acetic acid 0.25 % irrigation Use 70m via irrigation two times daily   alendronate (FOSAMAX) 70 MG tablet Take 70 mg by mouth every Saturday.   alum &  mag hydroxide-simeth (MAALOX/MYLANTA) 200-200-20 MG/5ML suspension Take 30 mLs by mouth every 4 (four) hours as needed for indigestion.   Calcium Carbonate Antacid (TUMS PO) Take one tablet by mouth twice daily   Capsaicin 0.1 % CREA Apply 1 application  topically every evening. (Apply to hands)   carboxymethylcellulose (REFRESH PLUS) 0.5 % SOLN Place 2 drops into both eyes every 6 (six) hours as needed (dry eyes).   cetirizine (ZYRTEC) 5 MG tablet Take 5 mg by mouth daily.   cholecalciferol (VITAMIN D3) 25 MCG (1000 UNIT) tablet Take 1,000 Units by mouth daily.   DULoxetine (CYMBALTA) 60 MG capsule Take 60 mg by mouth daily.   EPINEPHrine 0.3 mg/0.3 mL IJ SOAJ injection Inject 0.3 mg into the muscle as needed for anaphylaxis.   ferrous sulfate 325 (65 FE) MG tablet Take 325 mg by mouth 2 (two) times daily with a meal.   gabapentin (NEURONTIN) 100 MG capsule Take 100 mg by mouth 2 (two) times daily.   guaiFENesin (MUCINEX) 600 MG 12 hr tablet Take 600 mg by mouth 2 (two) times daily as needed for to loosen phlegm.   lactulose, encephalopathy, (CHRONULAC) 10 GM/15ML SOLN Take 30 mLs by mouth daily as needed (mild constipation).   magnesium citrate solution Take 60-120 mLs by mouth daily as needed (constipation).   methylcellulose oral powder Take 1 packet by mouth at bedtime.   mometasone (ELOCON) 0.1 % lotion Apply 1 Application topically every Saturday at 6 PM. (Apply to the ear canal for eczema)   Multiple Vitamin (MULTIVITAMIN WITH MINERALS) TABS tablet Take 1 tablet by mouth daily.   polyethylene glycol (MIRALAX / GLYCOLAX) 17 g packet Take 17 g by mouth 2 (two) times daily.   rivaroxaban (XARELTO) 20 MG TABS tablet Take 1 tablet (20 mg total) by mouth daily with supper.   saccharomyces boulardii (FLORASTOR) 250 MG capsule Take 250 mg by mouth daily.   Saline Spray 0.2 % SOLN Place 2 sprays into both nostrils as needed (nasal congestion).   simethicone (MYLICON) 80 MG chewable tablet Chew 80  mg by mouth every 8 (eight) hours as needed for flatulence.   sodium phosphate (FLEET) 7-19 GM/118ML ENEM Place 1 enema rectally every other day as needed for severe constipation.   spironolactone (ALDACTONE) 50 MG tablet Take 25 mg by mouth 2 (two) times daily.   tiZANidine (ZANAFLEX) 2 MG tablet Take 2 mg by mouth 3 (three) times daily.   triamcinolone cream (KENALOG) 0.1 % Apply 1 Application topically every 8 (eight) hours as needed (rashy/itchy skin). (Apply to thighs, lower legs and upper arms)   [DISCONTINUED] Alpha-D-Galactosidase (BEANO) TABS Take 1 tablet by mouth 3 (three) times daily.   [DISCONTINUED] calcium carbonate (TUMS - DOSED IN MG ELEMENTAL CALCIUM) 500 MG chewable tablet Chew 2 tablets by mouth every 8 (eight) hours as needed for heartburn.   [DISCONTINUED]  gabapentin (NEURONTIN) 600 MG tablet Take 600 mg by mouth at bedtime.   [DISCONTINUED] loratadine (CLARITIN) 10 MG tablet Take 10 mg by mouth daily.    No facility-administered encounter medications on file as of 02/04/2022.    Review of Systems  Constitutional:  Negative for activity change, appetite change, fatigue and unexpected weight change.  HENT:  Negative for congestion and hearing loss.   Eyes: Negative.   Respiratory:  Negative for cough and shortness of breath.   Cardiovascular:  Negative for chest pain, palpitations and leg swelling.  Gastrointestinal:  Positive for constipation. Negative for abdominal pain and diarrhea.  Genitourinary:  Positive for difficulty urinating (foley catheter). Negative for dysuria.  Musculoskeletal:  Negative for arthralgias and myalgias.  Skin:  Negative for color change and wound.  Neurological:  Positive for weakness. Negative for dizziness.  Psychiatric/Behavioral:  Negative for agitation, behavioral problems and confusion.     Immunization History  Administered Date(s) Administered   Influenza, High Dose Seasonal PF 11/17/2021   Moderna Sars-Covid-2 Vaccination  12/11/2021   Tdap 11/29/2017   Pertinent  Health Maintenance Due  Topic Date Due   INFLUENZA VACCINE  Completed   DEXA SCAN  Discontinued      08/18/2021   12:00 PM 08/18/2021    8:00 PM 08/19/2021   10:00 AM 08/19/2021    8:54 PM 08/20/2021    8:20 AM  Fall Risk  Patient Fall Risk Level High fall risk High fall risk High fall risk High fall risk High fall risk   Functional Status Survey:    Vitals:   02/04/22 1127  BP: 120/76  Pulse: 86  Resp: 18  Temp: 98 F (36.7 C)  SpO2: 96%  Weight: 168 lb 6.4 oz (76.4 kg)  Height: '5\' 7"'$  (1.702 m)   Body mass index is 26.38 kg/m. Physical Exam Constitutional:      General: She is not in acute distress.    Appearance: She is well-developed. She is not diaphoretic.  HENT:     Head: Normocephalic and atraumatic.     Mouth/Throat:     Pharynx: No oropharyngeal exudate.  Eyes:     Conjunctiva/sclera: Conjunctivae normal.     Pupils: Pupils are equal, round, and reactive to light.  Cardiovascular:     Rate and Rhythm: Normal rate and regular rhythm.     Heart sounds: Normal heart sounds.  Pulmonary:     Effort: Pulmonary effort is normal.     Breath sounds: Normal breath sounds.  Abdominal:     General: Bowel sounds are normal.     Palpations: Abdomen is soft.  Musculoskeletal:     Cervical back: Normal range of motion and neck supple.     Right lower leg: No edema.     Left lower leg: No edema.  Skin:    General: Skin is warm and dry.     Findings: Rash (scattered maculopapular rash to right upper back) present.  Neurological:     Mental Status: She is alert and oriented to person, place, and time.  Psychiatric:        Mood and Affect: Mood normal.     Labs reviewed: Recent Labs    08/18/21 0423 08/19/21 0333 08/20/21 0443 09/24/21 0000  NA 124* 130* 130* 137  K 4.6 5.1 4.5 4.7  CL 95* 102 101 101  CO2 '23 23 26 '$ 30*  GLUCOSE 117* 117* 101*  --   BUN 29* '23 15 11  '$ CREATININE 0.96 0.72 0.54  0.5  CALCIUM 7.1*  7.4* 7.1* 8.5*   Recent Labs    09/24/21 0000  ALBUMIN 3.2*   Recent Labs    08/15/21 1035 08/16/21 1333 08/18/21 0423 08/18/21 1759 08/19/21 0333 08/20/21 0443 08/20/21 1029 11/26/21 0000  WBC 7.4   < > 13.0*  --  10.6* 8.9  --  6.3  NEUTROABS 5.2  --   --   --   --   --   --   --   HGB 12.9   < > 7.7*   < > 8.9* 7.8* 8.6* 10.5*  HCT 41.3   < > 22.7*   < > 27.5* 23.7*  --  32*  MCV 89.8   < > 87.6  --  88.4 87.1  --   --   PLT 335   < > 180  --  198 216  --  346   < > = values in this interval not displayed.   No results found for: "TSH" No results found for: "HGBA1C" No results found for: "CHOL", "HDL", "LDLCALC", "LDLDIRECT", "TRIG", "CHOLHDL"  Significant Diagnostic Results in last 30 days:  No results found.  Assessment/Plan 1. Tetraparesis ([place]) -she has been doing well with therapy - tiZANidine (ZANAFLEX) 2 MG tablet; Take 1 tablet (2 mg total) by mouth 2 (two) times daily.  Dispense: 30 tablet  2. Benign essential HTN -well controlled, will decrease spironolactone to daily at this time and continue to monitor.  goal bp <140/90 Continue dietary modifications follow metabolic panel - spironolactone (ALDACTONE) 25 MG tablet; Take 1 tablet (25 mg total) by mouth daily.  3. History of DVT (deep vein thrombosis) -in 2020, continues with IVC filter and on xarelto.  4. Neurogenic bladder -continues with foley catheter.   5. Neuropathic pain -no longer having pain in hands, will reduce gabapentin to daily - gabapentin (NEURONTIN) 100 MG capsule; Take 1 capsule (100 mg total) by mouth daily.  6. Mild recurrent major depression ([place]) Well controlled on cymbalta 60 mg by mouth daily   7. Generalized edema -has resolved, will decrease aldactone to 25 mg by mouth daily  - spironolactone (ALDACTONE) 25 MG tablet; Take 1 tablet (25 mg total) by mouth daily.  8. Age-related osteoporosis without current pathological fracture -Recommended to take calcium 600 mg  twice daily with Vitamin D 2000 units daily and alendronate   9. Protein-calorie malnutrition, severe ([place]) Continues on supplements, will follow up cmp   10. Rash - triamcinolone cream (KENALOG) 0.1 %; Apply 1 Application topically 2 (two) times daily.  Dispense: 30 g; Refill: 0   Nai Borromeo K. Thiells, [facility] [phone]

## 2022-02-04 NOTE — Therapy (Signed)
OUTPATIENT PHYSICAL THERAPY NEURO TREATMENT   Patient Name: Sherry [name] MRN: [id number] DOB:Mar 04, 1934, 88 y.o., female Today's Date: 02/04/2022   PCP: Dewayne Shorter, MD REFERRING PROVIDER: Earnestine Leys   PT End of Session - 02/03/22 1503     Visit Number 23    Number of Visits 43    Date for PT Re-Evaluation 04/05/22    Authorization Time Period Initial cert 2/99/2426-83/41/9622; Recert 29/79/8921-02/10/4172    Progress Note Due on Visit 30    PT Start Time 1145    PT Stop Time 1220    PT Time Calculation (min) 35 min    Equipment Utilized During Treatment Gait belt    Activity Tolerance Patient tolerated treatment well    Behavior During Therapy WFL for tasks assessed/performed                       Past Medical History:  Diagnosis Date   Acute blood loss anemia    Arthritis    Cancer ([place])    skin   Central cord syndrome at C6 level of cervical spinal cord ([place]) 11/29/2017   Hypertension    Protein-calorie malnutrition, severe ([place]) 01/24/2018   S/P insertion of IVC (inferior vena caval) filter 01/24/2018   Tetraparesis ([place])    Past Surgical History:  Procedure Laterality Date   ANTERIOR CERVICAL DECOMP/DISCECTOMY FUSION N/A 11/29/2017   Procedure: Cervical five-six, six-seven Anterior Cervical Decompression Fusion;  Surgeon: Judith Part, MD;  Location: [facility];  Service: Neurosurgery;  Laterality: N/A;  Cervical five-six, six-seven Anterior Cervical Decompression Fusion   CATARACT EXTRACTION     FEMUR IM NAIL Left 08/16/2021   Procedure: INTRAMEDULLARY (IM) NAIL FEMORAL;  Surgeon: Earnestine Leys, MD;  Location: [facility];  Service: Orthopedics;  Laterality: Left;   IR IVC FILTER PLMT / S&I /IMG GUID/MOD SED  12/08/2017   Patient Active Problem List   Diagnosis Date Noted   Closed hip fracture ([place]) 08/15/2021   DVT (deep venous thrombosis) ([place]) 08/15/2021   Quadriplegia ([place]) 08/15/2021   Fall 08/15/2021   Constipation due to slow  transit 08/31/2018   Trauma 06/05/2018   Neuropathic pain 06/05/2018   Neurogenic bowel 06/05/2018   Vaginal yeast infection 01/30/2018   Healthcare-associated pneumonia 01/25/2018   Chronic allergic rhinitis 01/24/2018   Depression with anxiety 01/24/2018   UTI due to Klebsiella species 01/24/2018   Protein-calorie malnutrition, severe ([place]) 01/24/2018   S/P insertion of IVC (inferior vena caval) filter 01/24/2018   Tetraparesis ([place]) 01/20/2018   Neurogenic bladder 01/20/2018   Reactive depression    Benign essential HTN    Acute postoperative anemia due to expected blood loss    Central cord syndrome at C6 level of cervical spinal cord ([place]) 11/29/2017   Allergy to alpha-gal 11/25/2016   SCC (squamous cell carcinoma) 04/08/2014    ONSET DATE: 08/15/2021 (fall with B hip fx); Initial injury was in 2019- quadraplegia due to central cord syndrom of C6 secondary to neck fracture.   REFERRING DIAG: Bilateral Hip fx; Left Open- s/p Left IM nail femoral on 08/16/2021; Right closed  THERAPY DIAG:  Muscle weakness (generalized)  Other lack of coordination  Abnormality of gait and mobility  Difficulty in walking, not elsewhere classified  Other abnormalities of gait and mobility  Closed fracture of both hips, initial encounter ([place])  Unsteadiness on feet  Central cord syndrome, sequela ([place])  Rationale for Evaluation and Treatment Rehabilitation  SUBJECTIVE:  SUBJECTIVE STATEMENT:    Patient reported no changes since last visit. Feels like she is getting stronger and likes the resistance she is now able to perform. She continues to deny pain       Pt accompanied by:  Paid caregiver-   PERTINENT HISTORY: Patient was previous patient (see previous episode for specifics) She experienced a  fall at home on 08/15/2021 and was in [facility] from 08/15/2021- 08/20/2021.  Most recent history per Dr. Laurey Arrow on 08/20/2021.  Sherry [name] is a 88 y.o. 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   Patient has history of quadriplegia, and has been doing PT/OT with improvement of function. Pt has some use of both arms and can feed herself. She can walk a little bit with assistance. Pt accidentally fell at about 930 this morning after taking shower.  She injured her hips. Hospital Course:    Closed left hip fracture [facility]) s/p mechanical fall Closed right hip fracture (?old) Acute blood loss anemia --pt is s/p  INTRAMEDULLARY (IM) NAIL FEMORAL-left on 7/16  PAIN:  Are you having pain? NO: NPRS scale: 0/10 Pain location: Right hip Pain description: Ache Aggravating factors: any active Movement of right side Relieving factors: Rest  PRECAUTIONS: Fall  WEIGHT BEARING RESTRICTIONS Yes Bilateral non-weight bearing B LE's  FALLS: Has patient fallen in last 6 months? Yes. Number of falls 1  LIVING ENVIRONMENT: Lives with: lives with their family and lives in a nursing home Lives in: Other Long term care at [place]: No Has following equipment at home: Wheelchair (power), Ramped entry, and hoyer lift  PLOF: Needs assistance with ADLs, Needs assistance with homemaking, Needs assistance with gait, Needs assistance with transfers, and Leisure: Work puzzles, read, Watch TV  PATIENT GOALS Patient goal is to walk; Be able to strengthen my arms for better use  OBJECTIVE:   DIAGNOSTIC FINDINGS: Overall cognitive statusEXAM: DG HIP (WITH OR WITHOUT PELVIS) 2-3V LEFT; DG HIP (WITH OR WITHOUT PELVIS) 2-3V RIGHT   COMPARISON:  CT of the abdomen and pelvis  01/21/2020   FINDINGS: Left proximal femur fracture noted just below the intertrochanteric region. Marked ventral angulation noted on the cross-table image. Slight varus angulation present   Comminuted intratrochanteric fracture present on the right also with marked ventral angulation.   IMPRESSION: 1. Bilateral hip fractures. 2. Comminuted intertrochanteric fracture of the right hip. 3. Angulated fracture of the proximal left femur just below the intertrochanteric region.     Electronically Signed   By: San Morelle M.D.   On: 08/15/2021 11:54:   COGNITION:  Within functional limits for tasks assessed   SENSATION: Light touch: Impaired   EDEMA:  None  Strength R/L 2-/2- Shoulder flexion (anterior deltoid/pec major/coracobrachialis, axillary n. (C5-6) and musculocutaneous n. (C5-7)) 2-/3+ (within available ROM around 130 on lef)Shoulder abduction (deltoid/supraspinatus, axillary/suprascapular n, C5) 3+/3+ Shoulder external rotation (infraspinatus/teres minor) 4/4 Shoulder internal rotation (subcapularis/lats/pec major) 3+/3+ Shoulder extension (posterior deltoid, lats, teres major, axillary/thoracodorsal n.) 3+/3+ Shoulder horizontal abduction 4/4 Elbow flexion (biceps brachii, brachialis, brachioradialis, musculoskeletal n, C5-6) 4/4 Elbow extension (triceps, radial n, C7) 2-/2- Wrist Extension 3+/3+ Wrist Flexion   LOWER EXTREMITY MMT:    MMT Right Eval Left Eval  Hip flexion    Hip extension    Hip abduction  Hip adduction    Hip internal rotation    Hip external rotation    Knee flexion    Knee extension    Ankle dorsiflexion    Ankle plantarflexion    Ankle inversion    Ankle eversion    (Blank rows = not tested) - BLE not tested secondary to weight bearing precaution of B LE NWB  BED MOBILITY:  Unable to test due to NWB status of BLE- Patient currently dependent with all bed mobility using hoyer and caregiver  assist  TRANSFERS: Currently BLE NWB- Dependent on use of hoyer for all transfers  FUNCTIONAL TESTs:   PATIENT SURVEYS:  FOTO 12  TODAY'S TREATMENT:   -COPIED FROM [facility]-DR. H. MILLER note from 12/30/2021 12/30/2021 12/30/2021 Assessment and Plan:  Impression: 1. Bilateral hip fractures.  Recommendation: The patient will continue the current program until 02/01/2022 when she may try some weightbearing exercises as well. The patient will follow up in 03/2022 for exam and x-ray of both hips.  ATTESTATION:  This note has been generated by Romero Liner and edited by Christiana Fuchs, Quality Documentation Specialist.     - COPIED FROM [facility]- DR. Jonny Ruiz Note from 11/18/2021:  11/18/2021 11/18/2021 Assessment and Plan:  Bilateral hip fractures.   Recommendation:  The patient's PT may start with range of motion and more activity with the hips. I would like to avoid weightbearing still. We will see her back here in 6 weeks for an exam and x-ray of both hips.     ** Per above instructions - now able to begin some weight bearing activities.  THERAPEUTIC ACTIVITIES:   - Sit to stand in // bars x 3 trials- Mod to max Assist of 1 + SBA of her caregiver to ensure she was able to stand. VC for posture and to tighten knees and glutes in standing. Patient presented with forward trunk lean with difficulty extending hips/trunk.   - Standing in // bars- max assist at trunk +SBA of caregiver- Hyperextended left knee - unable to weight shift to right well without knees buckling- able to stand for 3 trials at 30-45 sec each trial.   THEREX: In power w/c  -AAROM Hip flex  x 15 reps each LE x 2 sets  -Hip abduction -AAROM 2 sets of 15 reps each LE  -Seated LAQ- AROM - 2 sets of 15 reps each LE   MMT Right 01/11/22 Left 01/11/22  Hip flexion 2- 2-  Hip extension 2- 2-  Hip abduction 2- 2-  Hip adduction 2- 3  Hip internal rotation 2- 2-  Hip external rotation 2- 2-   Knee flexion 2- 2-  Knee extension 2+ 3+  Ankle dorsiflexion 2- NT - boot  Ankle plantarflexion 2- NT- boot  Ankle inversion 2- NT- boot  Ankle eversion 2- NT- boot  (Blank rows = not tested)     PATIENT EDUCATION: Education details: PT plan of care/use of resistive bands Person educated: Patient Education method: Explanation, Demonstration, Tactile cues, and Verbal cues Education comprehension: verbalized understanding, returned demonstration, verbal cues required, tactile cues required, and needs further education   HOME EXERCISE PROGRAM: Discussed use of theraband and demo shoulder flex/abd and elbow flex and ext with use of yellow TB. Patient required assist to position therband but able to perform well with only one person assist  Access Code: [facility] URL: [email or web address] Date: 11/23/2021 Prepared by: Janna Arch Exercises - Seated Gluteal Sets - 2 x daily - 7 x weekly -  2 sets - 10 reps - 5 hold - Seated Quad Set - 2 x daily - 7 x weekly - 2 sets - 10 reps - 5 hold - Seated Long Arc Quad - 2 x daily - 7 x weekly - 2 sets - 10 reps - 5 hold - Seated March - 2 x daily - 7 x weekly - 2 sets - 10 reps - 5 hold - Seated Hip Abduction - 2 x daily - 7 x weekly - 2 sets - 10 reps - 5 hold - Seated Shoulder Shrugs - 2 x daily - 7 x weekly - 2 sets - 10 reps - 5 hold - Wheelchair Pressure Relief - 2 x daily - 7 x weekly - 2 sets - 10 reps - 5 hold  GOALS: Goals reviewed with patient? Yes  SHORT TERM GOALS: Target date: 02/22/2022  Pt will be independent with initial UE strengthening HEP in order to improve strength and balance in order to decrease fall risk and improve function at home and work. Baseline: 10/19/2021- patient with no formal UE HEP; 12/02/2021= Patient verbalized knowledge of HEP including use of theraband for UE strengthening.  Goal status: GOAL MET  LONG TERM GOALS: Target date: 04/05/2022  Pt will be independent with final for UE/LE HEP in order to  improve strength and balance in order to decrease fall risk and improve function at home and work. Baseline: Patient is currently BLE NWB and unable to participate in HEP. Has order for UE strengthening. 01/11/2022- Patient now able to participate in LE strengthening although NWB still- good understanding for some basic exercises. Will keep goal active to incorporate progressive LE strengthening exercises. Goal status: Progressing  2.  Pt will improve FOTO to target score of 35  to display perceived improvements in ability to complete ADL's.  Baseline: 10/19/2021= 12; 12/02/2021= 17; 01/11/2022=17 Goal status: Ongoing  3.  Pt will increase strength of B UE  by at least 1/2 MMT grade in order to demonstrate improvement in strength and function  Baseline: patient has range of 2-/5 to 4/5 BUE Strength; 12/02/2021= 4/5 except with wrist ext. 01/11/2022= 4/5 B UE strength expept for wrist Goal status: Partially met  ASSESSMENT:  CLINICAL IMPRESSION:  Treatment progressed to some weight bearing including sit to stand transfers and static standing. Patient was excited to try and performed well for her 1st time back on her feet. She fatigued very quickly and stated no pain just fatigued all over.  Pt will continue to benefit from skilled PT services to improve strength in anticipation for BLE clearance for [place] activities   OBJECTIVE IMPAIRMENTS Abnormal gait, decreased activity tolerance, decreased balance, decreased coordination, decreased endurance, decreased mobility, difficulty walking, decreased ROM, decreased strength, hypomobility, impaired flexibility, impaired UE functional use, postural dysfunction, and pain.   ACTIVITY LIMITATIONS carrying, lifting, bending, sitting, standing, squatting, sleeping, stairs, transfers, bed mobility, continence, bathing, toileting, dressing, self feeding, reach over head, hygiene/grooming, and caring for others  PARTICIPATION LIMITATIONS: meal prep, cleaning,  laundry, medication management, personal finances, interpersonal relationship, driving, shopping, community activity, and yard work  PERSONAL FACTORS Age, Time since onset of injury/illness/exacerbation, and 1-2 comorbidities: HTN, cervical Sx  are also affecting patient's functional outcome.   REHAB POTENTIAL: Good  CLINICAL DECISION MAKING: Evolving/moderate complexity  EVALUATION COMPLEXITY: Moderate  PLAN: PT FREQUENCY: 1-2x/week  PT DURATION: 12 weeks  PLANNED INTERVENTIONS: Therapeutic exercises, Therapeutic activity, Neuromuscular re-education, Balance training, Gait training, Patient/Family education, Self Care, Joint mobilization, DME instructions, Dry Needling,  Electrical stimulation, Wheelchair mobility training, Spinal mobilization, Cryotherapy, Moist heat, and Manual therapy  PLAN FOR NEXT SESSION: Continue with plan for LE strengthening while waiting for clearance to begin future weight bearing activities.    Ollen Bowl, PT 8:01 AM,02/04/22  Physical Therapist- [facility]  02/04/2022, 8:01 AM

## 2022-02-08 ENCOUNTER — Ambulatory Visit: Payer: Medicare HMO | Admitting: Occupational Therapy

## 2022-02-08 ENCOUNTER — Ambulatory Visit: Payer: Medicare HMO

## 2022-02-08 LAB — COMPREHENSIVE METABOLIC PANEL
Albumin: 3.7 (ref 3.5–5.0)
Calcium: 9 (ref 8.7–10.7)
Globulin: 2.2
eGFR: 85

## 2022-02-08 LAB — HEPATIC FUNCTION PANEL
ALT: 14 U/L (ref 7–35)
AST: 18 (ref 13–35)
Alkaline Phosphatase: 65 (ref 25–125)
Bilirubin, Total: 0.5

## 2022-02-08 LAB — BASIC METABOLIC PANEL
BUN: 10 (ref 4–21)
CO2: 33 — AB (ref 13–22)
Chloride: 98 — AB (ref 99–108)
Creatinine: 0.5 (ref 0.5–1.1)
Glucose: 88
Potassium: 4.8 mEq/L (ref 3.5–5.1)
Sodium: 137 (ref 137–147)

## 2022-02-08 LAB — CBC: RBC: 3.8 — AB (ref 3.87–5.11)

## 2022-02-08 LAB — CBC AND DIFFERENTIAL
HCT: 35 — AB (ref 36–46)
Hemoglobin: 11.7 — AB (ref 12.0–16.0)
Neutrophils Absolute: 3036
Platelets: 321 10*3/uL (ref 150–400)
WBC: 5.5

## 2022-02-08 NOTE — Therapy (Incomplete)
OUTPATIENT PHYSICAL THERAPY NEURO TREATMENT   Patient Name: Sherry [name] MRN: [id number] DOB:Apr 26, 1934, 88 y.o., female Today's Date: 02/08/2022   PCP: Sherry Shorter, MD REFERRING PROVIDER: Earnestine [name]               Past Medical History:  Diagnosis Date   Acute blood loss anemia    Arthritis    Cancer ([place])    skin   Central cord syndrome at C6 level of cervical spinal cord ([place]) 11/29/2017   Hypertension    Protein-calorie malnutrition, severe ([place]) 01/24/2018   S/P insertion of IVC (inferior vena caval) filter 01/24/2018   Tetraparesis ([place])    Past Surgical History:  Procedure Laterality Date   ANTERIOR CERVICAL DECOMP/DISCECTOMY FUSION N/A 11/29/2017   Procedure: Cervical five-six, six-seven Anterior Cervical Decompression Fusion;  Surgeon: Sherry Part, MD;  Location: [place];  Service: Neurosurgery;  Laterality: N/A;  Cervical five-six, six-seven Anterior Cervical Decompression Fusion   CATARACT EXTRACTION     FEMUR IM NAIL Left 08/16/2021   Procedure: INTRAMEDULLARY (IM) NAIL FEMORAL;  Surgeon: Sherry Leys, MD;  Location: [facility];  Service: Orthopedics;  Laterality: Left;   IR IVC FILTER PLMT / S&I /IMG GUID/MOD SED  12/08/2017   Patient Active Problem List   Diagnosis Date Noted   Closed hip fracture ([place]) 08/15/2021   DVT (deep venous thrombosis) ([place]) 08/15/2021   Quadriplegia ([place]) 08/15/2021   Fall 08/15/2021   Constipation due to slow transit 08/31/2018   Trauma 06/05/2018   Neuropathic pain 06/05/2018   Neurogenic bowel 06/05/2018   Vaginal yeast infection 01/30/2018   Healthcare-associated pneumonia 01/25/2018   Chronic allergic rhinitis 01/24/2018   Depression with anxiety 01/24/2018   UTI due to Klebsiella species 01/24/2018   Protein-calorie malnutrition, severe ([place]) 01/24/2018   S/P insertion of IVC (inferior vena caval) filter 01/24/2018   Tetraparesis ([place]) 01/20/2018   Neurogenic bladder 01/20/2018   Reactive  depression    Benign essential HTN    Acute postoperative anemia due to expected blood loss    Central cord syndrome at C6 level of cervical spinal cord ([place]) 11/29/2017   Allergy to alpha-gal 11/25/2016   SCC (squamous cell carcinoma) 04/08/2014    ONSET DATE: 08/15/2021 (fall with B hip fx); Initial injury was in 2019- quadraplegia due to central cord syndrom of C6 secondary to neck fracture.   REFERRING DIAG: Bilateral Hip fx; Left Open- s/p Left IM nail femoral on 08/16/2021; Right closed  THERAPY DIAG:  No diagnosis found.  Rationale for Evaluation and Treatment Rehabilitation  SUBJECTIVE:  SUBJECTIVE STATEMENT:    Patient reported no changes since last visit. Feels like she is getting stronger and likes the resistance she is now able to perform. She continues to deny pain       Pt accompanied by:  Paid caregiver-   PERTINENT HISTORY: Patient was previous patient (see previous episode for specifics) She experienced a fall at home on 08/15/2021 and was in [facility] from 08/15/2021- 08/20/2021.  Most recent history per Dr. Laurey Arrow on 08/20/2021.  Sherry [name] is a 88 y.o. 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   Patient has history of quadriplegia, and has been doing PT/OT with improvement of function. Pt has some use of both arms and can feed herself. She can walk a little bit with assistance. Pt accidentally fell at about 930 this morning after taking shower.  She injured her hips. Hospital Course:    Closed left hip fracture [facility]) s/p mechanical fall Closed  right hip fracture (?old) Acute blood loss anemia --pt is s/p  INTRAMEDULLARY (IM) NAIL FEMORAL-left on 7/16  PAIN:  Are you having pain? NO: NPRS scale: 0/10 Pain location: Right hip Pain description: Ache Aggravating factors: any active Movement of right side Relieving factors: Rest  PRECAUTIONS: Fall  WEIGHT BEARING RESTRICTIONS Yes Bilateral non-weight bearing B LE's  FALLS: Has patient fallen in last 6 months? Yes. Number of falls 1  LIVING ENVIRONMENT: Lives with: lives with their family and lives in a nursing home Lives in: Other Long term care at [place]: No Has following equipment at home: Wheelchair (power), Ramped entry, and hoyer lift  PLOF: Needs assistance with ADLs, Needs assistance with homemaking, Needs assistance with gait, Needs assistance with transfers, and Leisure: Work puzzles, read, Watch TV  PATIENT GOALS Patient goal is to walk; Be able to strengthen my arms for better use  OBJECTIVE:   DIAGNOSTIC FINDINGS: Overall cognitive statusEXAM: DG HIP (WITH OR WITHOUT PELVIS) 2-3V LEFT; DG HIP (WITH OR WITHOUT PELVIS) 2-3V RIGHT   COMPARISON:  CT of the abdomen and pelvis 01/21/2020   FINDINGS: Left proximal femur fracture noted just below the intertrochanteric region. Marked ventral angulation noted on the cross-table image. Slight varus angulation present   Comminuted intratrochanteric fracture present on the right also with marked ventral angulation.   IMPRESSION: 1. Bilateral hip fractures. 2. Comminuted intertrochanteric fracture of the right hip. 3. Angulated fracture of the proximal left femur just below the intertrochanteric region.     Electronically Signed   By: San Morelle M.D.   On: 08/15/2021 11:54:   COGNITION:  Within functional limits for tasks assessed   SENSATION: Light touch: Impaired   EDEMA:  None  Strength R/L 2-/2- Shoulder flexion (anterior deltoid/pec major/coracobrachialis, axillary n.  (C5-6) and musculocutaneous n. (C5-7)) 2-/3+ (within available ROM around 130 on lef)Shoulder abduction (deltoid/supraspinatus, axillary/suprascapular n, C5) 3+/3+ Shoulder external rotation (infraspinatus/teres minor) 4/4 Shoulder internal rotation (subcapularis/lats/pec major) 3+/3+ Shoulder extension (posterior deltoid, lats, teres major, axillary/thoracodorsal n.) 3+/3+ Shoulder horizontal abduction 4/4 Elbow flexion (biceps brachii, brachialis, brachioradialis, musculoskeletal n, C5-6) 4/4 Elbow extension (triceps, radial n, C7) 2-/2- Wrist Extension 3+/3+ Wrist Flexion   LOWER EXTREMITY MMT:    MMT Right 01/11/22 Left 01/11/22  Hip flexion 2- 2-  Hip extension 2- 2-  Hip abduction 2- 2-  Hip adduction 2- 3  Hip internal rotation 2- 2-  Hip external rotation 2- 2-  Knee flexion 2- 2-  Knee extension 2+ 3+  Ankle dorsiflexion 2- NT - boot  Ankle plantarflexion 2- NT- boot  Ankle inversion 2- NT- boot  Ankle eversion 2- NT- boot  (Blank rows = not tested)  BED MOBILITY:  Unable to test due to NWB status of BLE- Patient currently dependent with all bed mobility using hoyer and caregiver assist  TRANSFERS: Currently BLE NWB- Dependent on use of hoyer for all transfers  FUNCTIONAL TESTs:   PATIENT SURVEYS:  FOTO 12  TODAY'S TREATMENT:   -COPIED FROM [facility]-DR. H. MILLER note from 12/30/2021 12/30/2021 12/30/2021 Assessment and Plan:  Impression: 1. Bilateral hip fractures.  Recommendation: The patient will continue the current program until 02/01/2022 when she may try some weightbearing exercises as well. The patient will follow up in 03/2022 for exam and x-ray of both hips.  ATTESTATION:  This note has been generated by Romero Liner and edited by Christiana Fuchs, Quality Documentation Specialist.     - COPIED FROM [facility]- DR. Jonny Ruiz Note from 11/18/2021:  11/18/2021 11/18/2021 Assessment and Plan:  Bilateral hip fractures.   Recommendation:   The patient's PT may start with range of motion and more activity with the hips. I would like to avoid weightbearing still. We will see her back here in 6 weeks for an exam and x-ray of both hips.     ** Per above instructions - now able to begin some weight bearing activities.  THERAPEUTIC ACTIVITIES:   - Sit to stand in // bars x 3 trials- Mod to max Assist of 1 + SBA of her caregiver to ensure she was able to stand. VC for posture and to tighten knees and glutes in standing. Patient presented with forward trunk lean with difficulty extending hips/trunk.   - Standing in // bars- max assist at trunk +SBA of caregiver- Hyperextended left knee - unable to weight shift to right well without knees buckling- able to stand for 3 trials at 30-45 sec each trial.   THEREX: In power w/c  -AAROM Hip flex  x 15 reps each LE x 2 sets  -Hip abduction -AAROM 2 sets of 15 reps each LE  -Seated LAQ- AROM - 2 sets of 15 reps each LE      PATIENT EDUCATION: Education details: PT plan of care/use of resistive bands Person educated: Patient Education method: Explanation, Demonstration, Tactile cues, and Verbal cues Education comprehension: verbalized understanding, returned demonstration, verbal cues required, tactile cues required, and needs further education   HOME EXERCISE PROGRAM: Discussed use of theraband and demo shoulder flex/abd and elbow flex and ext with use of yellow TB. Patient required assist to position therband but able to perform well with only one person assist  Access Code: [facility] URL: [email or web address] Date: 11/23/2021 Prepared by: Janna Arch Exercises - Seated Gluteal Sets - 2 x daily - 7 x weekly - 2 sets - 10 reps - 5 hold - Seated Quad Set - 2 x daily - 7 x weekly - 2 sets - 10 reps - 5 hold - Seated Long Arc Quad - 2 x daily - 7 x weekly - 2 sets - 10 reps - 5 hold - Seated March - 2 x daily - 7 x weekly - 2 sets - 10 reps - 5 hold - Seated Hip Abduction  - 2 x daily - 7 x weekly - 2 sets -  10 reps - 5 hold - Seated Shoulder Shrugs - 2 x daily - 7 x weekly - 2 sets - 10 reps - 5 hold - Wheelchair Pressure Relief - 2 x daily - 7 x weekly - 2 sets - 10 reps - 5 hold  GOALS: Goals reviewed with patient? Yes  SHORT TERM GOALS: Target date: 02/22/2022  Pt will be independent with initial UE strengthening HEP in order to improve strength and balance in order to decrease fall risk and improve function at home and work. Baseline: 10/19/2021- patient with no formal UE HEP; 12/02/2021= Patient verbalized knowledge of HEP including use of theraband for UE strengthening.  Goal status: GOAL MET  LONG TERM GOALS: Target date: 04/05/2022  Pt will be independent with final for UE/LE HEP in order to improve strength and balance in order to decrease fall risk and improve function at home and work. Baseline: Patient is currently BLE NWB and unable to participate in HEP. Has order for UE strengthening. 01/11/2022- Patient now able to participate in LE strengthening although NWB still- good understanding for some basic exercises. Will keep goal active to incorporate progressive LE strengthening exercises. Goal status: Progressing  2.  Pt will improve FOTO to target score of 35  to display perceived improvements in ability to complete ADL's.  Baseline: 10/19/2021= 12; 12/02/2021= 17; 01/11/2022=17 Goal status: Ongoing  3.  Pt will increase strength of B UE  by at least 1/2 MMT grade in order to demonstrate improvement in strength and function  Baseline: patient has range of 2-/5 to 4/5 BUE Strength; 12/02/2021= 4/5 except with wrist ext. 01/11/2022= 4/5 B UE strength expept for wrist Goal status: Partially met  ASSESSMENT:  CLINICAL IMPRESSION:  Treatment progressed to some weight bearing including sit to stand transfers and static standing. Patient was excited to try and performed well for her 1st time back on her feet. She fatigued very quickly and stated no pain  just fatigued all over.  Pt will continue to benefit from skilled PT services to improve strength in anticipation for BLE clearance for [place] activities   OBJECTIVE IMPAIRMENTS Abnormal gait, decreased activity tolerance, decreased balance, decreased coordination, decreased endurance, decreased mobility, difficulty walking, decreased ROM, decreased strength, hypomobility, impaired flexibility, impaired UE functional use, postural dysfunction, and pain.   ACTIVITY LIMITATIONS carrying, lifting, bending, sitting, standing, squatting, sleeping, stairs, transfers, bed mobility, continence, bathing, toileting, dressing, self feeding, reach over head, hygiene/grooming, and caring for others  PARTICIPATION LIMITATIONS: meal prep, cleaning, laundry, medication management, personal finances, interpersonal relationship, driving, shopping, community activity, and yard work  PERSONAL FACTORS Age, Time since onset of injury/illness/exacerbation, and 1-2 comorbidities: HTN, cervical Sx  are also affecting patient's functional outcome.   REHAB POTENTIAL: Good  CLINICAL DECISION MAKING: Evolving/moderate complexity  EVALUATION COMPLEXITY: Moderate  PLAN: PT FREQUENCY: 1-2x/week  PT DURATION: 12 weeks  PLANNED INTERVENTIONS: Therapeutic exercises, Therapeutic activity, Neuromuscular re-education, Balance training, Gait training, Patient/Family education, Self Care, Joint mobilization, DME instructions, Dry Needling, Electrical stimulation, Wheelchair mobility training, Spinal mobilization, Cryotherapy, Moist heat, and Manual therapy  PLAN FOR NEXT SESSION: Continue with weight bearing activities and LE strengthening as appropriate.    Ollen Bowl, PT 9:01 AM,02/08/22  Physical Therapist- [facility]  02/08/2022, 9:01 AM

## 2022-02-10 ENCOUNTER — Ambulatory Visit: Payer: Medicare HMO | Admitting: Occupational Therapy

## 2022-02-10 ENCOUNTER — Ambulatory Visit: Payer: Medicare HMO

## 2022-02-10 DIAGNOSIS — M6281 Muscle weakness (generalized): Secondary | ICD-10-CM | POA: Diagnosis not present

## 2022-02-10 DIAGNOSIS — R2689 Other abnormalities of gait and mobility: Secondary | ICD-10-CM

## 2022-02-10 DIAGNOSIS — R262 Difficulty in walking, not elsewhere classified: Secondary | ICD-10-CM

## 2022-02-10 DIAGNOSIS — S72002A Fracture of unspecified part of neck of left femur, initial encounter for closed fracture: Secondary | ICD-10-CM

## 2022-02-10 DIAGNOSIS — R278 Other lack of coordination: Secondary | ICD-10-CM

## 2022-02-10 DIAGNOSIS — R2681 Unsteadiness on feet: Secondary | ICD-10-CM

## 2022-02-10 DIAGNOSIS — R269 Unspecified abnormalities of gait and mobility: Secondary | ICD-10-CM

## 2022-02-10 NOTE — Therapy (Signed)
OUTPATIENT PHYSICAL THERAPY NEURO TREATMENT   Patient Name: Sherry [name] MRN: [id number] DOB:[birth year], 88 y.o., female Today's Date: 02/11/2022   PCP: Sherry Shorter, MD REFERRING PROVIDER: Earnestine [name]   PT End of Session - 02/10/22 1257     Visit Number 24    Number of Visits 43    Date for PT Re-Evaluation 04/05/22    Authorization Time Period Initial cert 02/20/4172-09/14/4816; Recert 56/31/4970-03/10/3783    Progress Note Due on Visit 30    PT Start Time 1144    PT Stop Time 1220    PT Time Calculation (min) 36 min    Equipment Utilized During Treatment Gait belt    Activity Tolerance Patient tolerated treatment well    Behavior During Therapy WFL for tasks assessed/performed                        Past Medical History:  Diagnosis Date   Acute blood loss anemia    Arthritis    Cancer ([place])    skin   Central cord syndrome at C6 level of cervical spinal cord ([place]) 11/29/2017   Hypertension    Protein-calorie malnutrition, severe ([place]) 01/24/2018   S/P insertion of IVC (inferior vena caval) filter 01/24/2018   Tetraparesis ([place])    Past Surgical History:  Procedure Laterality Date   ANTERIOR CERVICAL DECOMP/DISCECTOMY FUSION N/A 11/29/2017   Procedure: Cervical five-six, six-seven Anterior Cervical Decompression Fusion;  Surgeon: Sherry Part, MD;  Location: [facility];  Service: Neurosurgery;  Laterality: N/A;  Cervical five-six, six-seven Anterior Cervical Decompression Fusion   CATARACT EXTRACTION     FEMUR IM NAIL Left 08/16/2021   Procedure: INTRAMEDULLARY (IM) NAIL FEMORAL;  Surgeon: Sherry Leys, MD;  Location: [facility];  Service: Orthopedics;  Laterality: Left;   IR IVC FILTER PLMT / S&I /IMG GUID/MOD SED  12/08/2017   Patient Active Problem List   Diagnosis Date Noted   Closed hip fracture ([place]) 08/15/2021   DVT (deep venous thrombosis) ([place]) 08/15/2021   Quadriplegia ([place]) 08/15/2021   Fall 08/15/2021   Constipation due to slow  transit 08/31/2018   Trauma 06/05/2018   Neuropathic pain 06/05/2018   Neurogenic bowel 06/05/2018   Vaginal yeast infection 01/30/2018   Healthcare-associated pneumonia 01/25/2018   Chronic allergic rhinitis 01/24/2018   Depression with anxiety 01/24/2018   UTI due to Klebsiella species 01/24/2018   Protein-calorie malnutrition, severe ([place]) 01/24/2018   S/P insertion of IVC (inferior vena caval) filter 01/24/2018   Tetraparesis ([place]) 01/20/2018   Neurogenic bladder 01/20/2018   Reactive depression    Benign essential HTN    Acute postoperative anemia due to expected blood loss    Central cord syndrome at C6 level of cervical spinal cord ([place]) 11/29/2017   Allergy to alpha-gal 11/25/2016   SCC (squamous cell carcinoma) 04/08/2014    ONSET DATE: 08/15/2021 (fall with B hip fx); Initial injury was in 2019- quadraplegia due to central cord syndrom of C6 secondary to neck fracture.   REFERRING DIAG: Bilateral Hip fx; Left Open- s/p Left IM nail femoral on 08/16/2021; Right closed  THERAPY DIAG:  Muscle weakness (generalized)  Other lack of coordination  Abnormality of gait and mobility  Difficulty in walking, not elsewhere classified  Other abnormalities of gait and mobility  Closed fracture of both hips, initial encounter ([place])  Unsteadiness on feet  Rationale for Evaluation and Treatment Rehabilitation  SUBJECTIVE:  SUBJECTIVE STATEMENT:    Patient states she did not sleep well last night due to temperature conditions in her room.        Pt accompanied by:  Paid caregiver-   PERTINENT HISTORY: Patient was previous patient (see previous episode for specifics) She experienced a fall at home on 08/15/2021 and was in [facility] from 08/15/2021- 08/20/2021.  Most recent history per Dr.  Laurey [name] on 08/20/2021.  Sherry [name] is a 88 y.o. 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   Patient has history of quadriplegia, and has been doing PT/OT with improvement of function. Pt has some use of both arms and can feed herself. She can walk a little bit with assistance. Pt accidentally fell at about 930 this morning after taking shower.  She injured her hips. Hospital Course:    Closed left hip fracture [facility]) s/p mechanical fall Closed right hip fracture (?old) Acute blood loss anemia --pt is s/p  INTRAMEDULLARY (IM) NAIL FEMORAL-left on 7/16  PAIN:  Are you having pain? NO: NPRS scale: 0/10 Pain location: Right hip Pain description: Ache Aggravating factors: any active Movement of right side Relieving factors: Rest  PRECAUTIONS: Fall  WEIGHT BEARING RESTRICTIONS Yes Bilateral non-weight bearing B LE's  FALLS: Has patient fallen in last 6 months? Yes. Number of falls 1  LIVING ENVIRONMENT: Lives with: lives with their family and lives in a nursing home Lives in: Other Long term care at [place]: No Has following equipment at home: Wheelchair (power), Ramped entry, and hoyer lift  PLOF: Needs assistance with ADLs, Needs assistance with homemaking, Needs assistance with gait, Needs assistance with transfers, and Leisure: Work puzzles, read, Watch TV  PATIENT GOALS Patient goal is to walk; Be able to strengthen my arms for better use  OBJECTIVE:   DIAGNOSTIC FINDINGS: Overall cognitive statusEXAM: DG HIP (WITH OR WITHOUT PELVIS) 2-3V LEFT; DG HIP (WITH OR WITHOUT PELVIS) 2-3V RIGHT   COMPARISON:  CT of the abdomen and pelvis 01/21/2020   FINDINGS: Left proximal femur fracture noted just below the intertrochanteric region.  Marked ventral angulation noted on the cross-table image. Slight varus angulation present   Comminuted intratrochanteric fracture present on the right also with marked ventral angulation.   IMPRESSION: 1. Bilateral hip fractures. 2. Comminuted intertrochanteric fracture of the right hip. 3. Angulated fracture of the proximal left femur just below the intertrochanteric region.     Electronically Signed   By: Sherry CarrollD.   On: 08/15/2021 11:54:   COGNITION:  Within functional limits for tasks assessed   SENSATION: Light touch: Impaired   EDEMA:  None  Strength R/L 2-/2- Shoulder flexion (anterior deltoid/pec major/coracobrachialis, axillary n. (C5-6) and musculocutaneous n. (C5-7)) 2-/3+ (within available ROM around 130 on lef)Shoulder abduction (deltoid/supraspinatus, axillary/suprascapular n, C5) 3+/3+ Shoulder external rotation (infraspinatus/teres minor) 4/4 Shoulder internal rotation (subcapularis/lats/pec major) 3+/3+ Shoulder extension (posterior deltoid, lats, teres major, axillary/thoracodorsal n.) 3+/3+ Shoulder horizontal abduction 4/4 Elbow flexion (biceps brachii, brachialis, brachioradialis, musculoskeletal n, C5-6) 4/4 Elbow extension (triceps, radial n, C7) 2-/2- Wrist Extension 3+/3+ Wrist Flexion   LOWER EXTREMITY MMT:    MMT Right 01/11/22 Left 01/11/22  Hip flexion 2- 2-  Hip extension 2- 2-  Hip abduction 2- 2-  Hip adduction 2- 3  Hip internal rotation 2- 2-  Hip external rotation 2- 2-  Knee flexion 2- 2-  Knee extension 2+ 3+  Ankle dorsiflexion 2- NT - boot  Ankle plantarflexion 2- NT- boot  Ankle inversion 2- NT- boot  Ankle eversion 2- NT- boot  (Blank rows = not tested)  BED MOBILITY:  Unable to test due to NWB status of BLE- Patient currently dependent with all bed mobility using hoyer and caregiver assist  TRANSFERS: Currently BLE NWB- Dependent on use of hoyer for all transfers  FUNCTIONAL TESTs:   PATIENT  SURVEYS:  FOTO 12  TODAY'S TREATMENT:   -COPIED FROM [facility]-DR. H. MILLER note from 12/30/2021 12/30/2021 12/30/2021 Assessment and Plan:  Impression: 1. Bilateral hip fractures.  Recommendation: The patient will continue the current program until 02/01/2022 when she may try some weightbearing exercises as well. The patient will follow up in 03/2022 for exam and x-ray of both hips.  ATTESTATION:  This note has been generated by Romero Liner and edited by Christiana Fuchs, Quality Documentation Specialist.     - COPIED FROM [facility]- DR. Jonny Ruiz Note from 11/18/2021:  11/18/2021 11/18/2021 Assessment and Plan:  Bilateral hip fractures.   Recommendation:  The patient's PT may start with range of motion and more activity with the hips. I would like to avoid weightbearing still. We will see her back here in 6 weeks for an exam and x-ray of both hips.     ** Per above instructions - now able to begin some weight bearing activities.  THERAPEUTIC ACTIVITIES:   - Sit to stand in // bars x 3 trials- Mod to max Assist of 1 VC for posture/glute activation  in standing using mirror as feedback Patient presented with forward trunk lean with difficulty extending hips/trunk.   - Standing in // bars- max assist at trunk +SBA of caregiver- Hyperextended left knee - unable to stand erect with feet out in front of trunk and more difficulty advancing hips forward today. No complaint of pain just overall fatigue.   THEREX: In power w/c  -AAROM Hip flex  x 15 reps each LE x 2 sets  -Hip abduction -AAROM 2 sets of 15 reps each LE  -Seated LAQ- AROM - 2 sets of 15 reps each LE - Hip adduction squeeze with 5 sec hold x 10.    -Resistive leg press LE x 12 reps.       PATIENT EDUCATION: Education details: PT plan of care/use of resistive bands Person educated: Patient Education method: Explanation, Demonstration, Tactile cues, and Verbal cues Education comprehension: verbalized  understanding, returned demonstration, verbal cues required, tactile cues required, and needs further education   HOME EXERCISE PROGRAM: Discussed use of theraband and demo shoulder flex/abd and elbow flex and ext with use of yellow TB. Patient required assist to position therband but able to perform well with only one person assist  Access Code: [facility] URL: [email or web address] Date: 11/23/2021 Prepared by: Janna Arch Exercises - Seated Gluteal Sets - 2 x daily - 7 x weekly - 2 sets - 10 reps - 5 hold - Seated Quad Set - 2 x daily - 7 x weekly - 2 sets - 10 reps - 5 hold - Seated Long Arc Quad - 2 x daily - 7 x weekly - 2 sets - 10 reps - 5 hold - Seated March - 2 x daily - 7 x weekly - 2 sets - 10 reps - 5 hold - Seated Hip Abduction - 2 x daily - 7 x weekly -  2 sets - 10 reps - 5 hold - Seated Shoulder Shrugs - 2 x daily - 7 x weekly - 2 sets - 10 reps - 5 hold - Wheelchair Pressure Relief - 2 x daily - 7 x weekly - 2 sets - 10 reps - 5 hold  GOALS: Goals reviewed with patient? Yes  SHORT TERM GOALS: Target date: 02/22/2022  Pt will be independent with initial UE strengthening HEP in order to improve strength and balance in order to decrease fall risk and improve function at home and work. Baseline: 10/19/2021- patient with no formal UE HEP; 12/02/2021= Patient verbalized knowledge of HEP including use of theraband for UE strengthening.  Goal status: GOAL MET  LONG TERM GOALS: Target date: 04/05/2022  Pt will be independent with final for UE/LE HEP in order to improve strength and balance in order to decrease fall risk and improve function at home and work. Baseline: Patient is currently BLE NWB and unable to participate in HEP. Has order for UE strengthening. 01/11/2022- Patient now able to participate in LE strengthening although NWB still- good understanding for some basic exercises. Will keep goal active to incorporate progressive LE strengthening exercises. Goal status:  Progressing  2.  Pt will improve FOTO to target score of 35  to display perceived improvements in ability to complete ADL's.  Baseline: 10/19/2021= 12; 12/02/2021= 17; 01/11/2022=17 Goal status: Ongoing  3.  Pt will increase strength of B UE  by at least 1/2 MMT grade in order to demonstrate improvement in strength and function  Baseline: patient has range of 2-/5 to 4/5 BUE Strength; 12/02/2021= 4/5 except with wrist ext. 01/11/2022= 4/5 B UE strength expept for wrist Goal status: Partially met  ASSESSMENT:  CLINICAL IMPRESSION:  Treatment contined to advance toward more weightbearing activities however patient struggled more in standing today- unable to stand erect with more fatigue/weakness than previous visit. Patient attributes the weakness to not sleeping well. She was also more fatigued with LE strengthening requiring more AAROM than in past several visits. Pt will continue to benefit from skilled PT services to improve strength in anticipation for BLE clearance for [place] activities   OBJECTIVE IMPAIRMENTS Abnormal gait, decreased activity tolerance, decreased balance, decreased coordination, decreased endurance, decreased mobility, difficulty walking, decreased ROM, decreased strength, hypomobility, impaired flexibility, impaired UE functional use, postural dysfunction, and pain.   ACTIVITY LIMITATIONS carrying, lifting, bending, sitting, standing, squatting, sleeping, stairs, transfers, bed mobility, continence, bathing, toileting, dressing, self feeding, reach over head, hygiene/grooming, and caring for others  PARTICIPATION LIMITATIONS: meal prep, cleaning, laundry, medication management, personal finances, interpersonal relationship, driving, shopping, community activity, and yard work  PERSONAL FACTORS Age, Time since onset of injury/illness/exacerbation, and 1-2 comorbidities: HTN, cervical Sx  are also affecting patient's functional outcome.   REHAB POTENTIAL: Good  CLINICAL  DECISION MAKING: Evolving/moderate complexity  EVALUATION COMPLEXITY: Moderate  PLAN: PT FREQUENCY: 1-2x/week  PT DURATION: 12 weeks  PLANNED INTERVENTIONS: Therapeutic exercises, Therapeutic activity, Neuromuscular re-education, Balance training, Gait training, Patient/Family education, Self Care, Joint mobilization, DME instructions, Dry Needling, Electrical stimulation, Wheelchair mobility training, Spinal mobilization, Cryotherapy, Moist heat, and Manual therapy  PLAN FOR NEXT SESSION: Continue with weight bearing activities and LE strengthening as appropriate.    Ollen Bowl, PT 6:49 AM,02/11/22  Physical Therapist- [facility]  02/11/2022, 6:49 AM

## 2022-02-12 ENCOUNTER — Encounter: Payer: Self-pay | Admitting: Student

## 2022-02-12 ENCOUNTER — Non-Acute Institutional Stay (SKILLED_NURSING_FACILITY): Payer: Medicare HMO | Admitting: Student

## 2022-02-12 DIAGNOSIS — G8250 Quadriplegia, unspecified: Secondary | ICD-10-CM

## 2022-02-12 DIAGNOSIS — I5089 Other heart failure: Secondary | ICD-10-CM

## 2022-02-12 DIAGNOSIS — S14126S Central cord syndrome at C6 level of cervical spinal cord, sequela: Secondary | ICD-10-CM

## 2022-02-12 DIAGNOSIS — K592 Neurogenic bowel, not elsewhere classified: Secondary | ICD-10-CM

## 2022-02-12 DIAGNOSIS — E43 Unspecified severe protein-calorie malnutrition: Secondary | ICD-10-CM

## 2022-02-12 DIAGNOSIS — I824Y3 Acute embolism and thrombosis of unspecified deep veins of proximal lower extremity, bilateral: Secondary | ICD-10-CM | POA: Diagnosis not present

## 2022-02-12 DIAGNOSIS — Z978 Presence of other specified devices: Secondary | ICD-10-CM

## 2022-02-12 DIAGNOSIS — I110 Hypertensive heart disease with heart failure: Secondary | ICD-10-CM | POA: Diagnosis not present

## 2022-02-12 DIAGNOSIS — F330 Major depressive disorder, recurrent, mild: Secondary | ICD-10-CM

## 2022-02-12 NOTE — Progress Notes (Addendum)
Location:  Other Nursing Home Room Number: [facility]:  SNF [phone]) Provider:  Wynn Banker, Jordan Hawks, MD  Patient Care Team: Dewayne Shorter, MD as PCP - General [facility])  Extended Emergency Contact Information Primary Emergency Contact: [name] Mobile Phone: [phone] Relation: Daughter Secondary Emergency Contact: Lindi Adie Address: [address] [place]          [place], [place] [address] Johnnette Litter of [place] Phone: [phone] Relation: Spouse  Code Status:  Full Goals of care: Advanced Directive information    02/12/2022    9:40 AM  Advanced Directives  Does Patient Have a Medical Advance Directive? Yes  Type of Advance Directive [place]  Does patient want to make changes to medical advance directive? No - Patient declined  Copy of [place] in Chart? Yes - validated most recent copy scanned in chart (See row information)     Chief Complaint  Patient presents with   Medical Management of Chronic Issues    Medical Management of Chronic Issues.     HPI:  Pt is a 88 y.o. female seen today for medical management of chronic diseases.  PT is going well. Her only concern has been bowel movements. She received an enema with little relief. Mag Citrate last night helped some,but she is feeling full quickly. Denies nausea or vomiting.     She has had some issues with pain in her hands as well -- neuropathic in nature. It is starting to impact her sleep.   She doesn't feel like she can drink a lot of fluids - she has had a lot of sediment in her urine as well. The catheter was getting clocked for a few weeks.    Past Medical History:  Diagnosis Date   Acute blood loss anemia    Arthritis    Cancer ([place])    skin   Central cord syndrome at C6 level of cervical spinal cord ([place]) 11/29/2017   Hypertension    Protein-calorie malnutrition, severe ([place]) 01/24/2018   S/P insertion of  IVC (inferior vena caval) filter 01/24/2018   Tetraparesis ([place])    Past Surgical History:  Procedure Laterality Date   ANTERIOR CERVICAL DECOMP/DISCECTOMY FUSION N/A 11/29/2017   Procedure: Cervical five-six, six-seven Anterior Cervical Decompression Fusion;  Surgeon: Judith Part, MD;  Location: [facility];  Service: Neurosurgery;  Laterality: N/A;  Cervical five-six, six-seven Anterior Cervical Decompression Fusion   CATARACT EXTRACTION     FEMUR IM NAIL Left 08/16/2021   Procedure: INTRAMEDULLARY (IM) NAIL FEMORAL;  Surgeon: Earnestine Leys, MD;  Location: [facility];  Service: Orthopedics;  Laterality: Left;   IR IVC FILTER PLMT / S&I /IMG GUID/MOD SED  12/08/2017    Allergies  Allergen Reactions   Other Swelling   Lisinopril-Hydrochlorothiazide Swelling    Swelling of the tongue   Beef-Derived Products Swelling    Facial and lip swelling (due to tick bite)   Dairy Aid [Tilactase] Swelling   Lisinopril-Hydrochlorothiazide Swelling    Tongue swelling   Pork-Derived Products Swelling    Outpatient Encounter Medications as of 02/12/2022  Medication Sig   acetaminophen (TYLENOL) 500 MG tablet Take 500 mg by mouth every 12 (twelve) hours as needed for mild pain.   acetaminophen (TYLENOL) 650 MG CR tablet Take 650 mg by mouth every 8 (eight) hours as needed for pain.   acetic acid 0.25 % irrigation Use 23m via irrigation two times daily   alendronate (FOSAMAX) 70  MG tablet Take 70 mg by mouth every Saturday.   alum & mag hydroxide-simeth (MAALOX/MYLANTA) 200-200-20 MG/5ML suspension Take 30 mLs by mouth every 4 (four) hours as needed for indigestion.   Calcium Carbonate Antacid (TUMS PO) Take one tablet by mouth twice daily   Capsaicin 0.1 % CREA Apply 1 application  topically every evening. (Apply to hands)   carboxymethylcellulose (REFRESH PLUS) 0.5 % SOLN Place 2 drops into both eyes every 6 (six) hours as needed (dry eyes).   cetirizine (ZYRTEC) 5 MG tablet Take 5 mg by mouth  daily.   cholecalciferol (VITAMIN D3) 25 MCG (1000 UNIT) tablet Take 1,000 Units by mouth daily.   DULoxetine (CYMBALTA) 60 MG capsule Take 60 mg by mouth daily.   EPINEPHrine 0.3 mg/0.3 mL IJ SOAJ injection Inject 0.3 mg into the muscle as needed for anaphylaxis.   ferrous sulfate 325 (65 FE) MG tablet Take 325 mg by mouth 2 (two) times daily with a meal.   gabapentin (NEURONTIN) 100 MG capsule Take 1 capsule (100 mg total) by mouth daily.   guaiFENesin (MUCINEX) 600 MG 12 hr tablet Take 600 mg by mouth 2 (two) times daily as needed for to loosen phlegm.   [employer]) OINT Apply to buttocks/gluteal folds topically every shift.   lactulose, encephalopathy, (CHRONULAC) 10 GM/15ML SOLN Take 30 mLs by mouth daily as needed (mild constipation).   magnesium citrate solution Take 60-120 mLs by mouth daily as needed (constipation).   methylcellulose oral powder Take 1 packet by mouth at bedtime.   mometasone (ELOCON) 0.1 % lotion Apply 1 Application topically every Saturday at 6 PM. (Apply to the ear canal for eczema)   Multiple Vitamin (MULTIVITAMIN WITH MINERALS) TABS tablet Take 1 tablet by mouth daily.   polyethylene glycol (MIRALAX / GLYCOLAX) 17 g packet Take 17 g by mouth 2 (two) times daily.   rivaroxaban (XARELTO) 20 MG TABS tablet Take 1 tablet (20 mg total) by mouth daily with supper.   saccharomyces boulardii (FLORASTOR) 250 MG capsule Take 250 mg by mouth daily.   Saline Spray 0.2 % SOLN Place 2 sprays into both nostrils as needed (nasal congestion).   simethicone (MYLICON) 80 MG chewable tablet Chew 80 mg by mouth every 6 (six) hours as needed for flatulence.   sodium phosphate (FLEET) 7-19 GM/118ML ENEM Place 1 enema rectally every other day as needed for severe constipation.   spironolactone (ALDACTONE) 25 MG tablet Take 1 tablet (25 mg total) by mouth daily.   tiZANidine (ZANAFLEX) 2 MG tablet Take 1 tablet (2 mg total) by mouth 2 (two) times daily.    triamcinolone cream (KENALOG) 0.1 % Apply 1 Application topically 2 (two) times daily.   No facility-administered encounter medications on file as of 02/12/2022.    Review of Systems  All other systems reviewed and are negative.   Immunization History  Administered Date(s) Administered   Influenza, High Dose Seasonal PF 11/17/2021   Moderna Sars-Covid-2 Vaccination 12/11/2021   Tdap 11/29/2017   Pertinent  Health Maintenance Due  Topic Date Due   INFLUENZA VACCINE  Completed   DEXA SCAN  Discontinued      08/18/2021   12:00 PM 08/18/2021    8:00 PM 08/19/2021   10:00 AM 08/19/2021    8:54 PM 08/20/2021    8:20 AM  Fall Risk  Patient Fall Risk Level High fall risk High fall risk High fall risk High fall risk High fall risk   Functional Status Survey:  Vitals:   02/12/22 0929 02/12/22 1601  BP: (!) 158/93 120/68  Pulse: 89   Resp: 18   Temp: (!) 97 F (36.1 C)   SpO2: 96%   Weight: 168 lb 6.4 oz (76.4 kg)   Height: '5\' 7"'$  (1.702 m)    Body mass index is 26.38 kg/m. Physical Exam Vitals reviewed.  Constitutional:      Comments: In wheelchair  Cardiovascular:     Rate and Rhythm: Normal rate.     Pulses: Normal pulses.  Pulmonary:     Effort: Pulmonary effort is normal.  Abdominal:     General: Bowel sounds are normal.     Palpations: Abdomen is soft.  Musculoskeletal:     Comments: Mobility of upper arms with limited dexterity of fingers  Neurological:     Mental Status: She is alert and oriented to person, place, and time.     Labs reviewed: Recent Labs    08/18/21 0423 08/19/21 0333 08/20/21 0443 09/24/21 0000  NA 124* 130* 130* 137  K 4.6 5.1 4.5 4.7  CL 95* 102 101 101  CO2 '23 23 26 '$ 30*  GLUCOSE 117* 117* 101*  --   BUN 29* '23 15 11  '$ CREATININE 0.96 0.72 0.54 0.5  CALCIUM 7.1* 7.4* 7.1* 8.5*   Recent Labs    09/24/21 0000  ALBUMIN 3.2*   Recent Labs    08/15/21 1035 08/16/21 1333 08/18/21 0423 08/18/21 1759 08/19/21 0333  08/20/21 0443 08/20/21 1029 11/26/21 0000  WBC 7.4   < > 13.0*  --  10.6* 8.9  --  6.3  NEUTROABS 5.2  --   --   --   --   --   --   --   HGB 12.9   < > 7.7*   < > 8.9* 7.8* 8.6* 10.5*  HCT 41.3   < > 22.7*   < > 27.5* 23.7*  --  32*  MCV 89.8   < > 87.6  --  88.4 87.1  --   --   PLT 335   < > 180  --  198 216  --  346   < > = values in this interval not displayed.   No results found for: "TSH" No results found for: "HGBA1C" No results found for: "CHOL", "HDL", "LDLCALC", "LDLDIRECT", "TRIG", "CHOLHDL"  Significant Diagnostic Results in last 30 days:  No results found.  Assessment/Plan 1. Tetraparesis ([place]) 4. Central cord syndrome at C6 level of cervical spinal cord, sequela [facility]) Patient remains in wheelchair and maintains an aspect of independence while using electric wheelchair. Has regular physical therapy. Requires total care. Continue xanaflex for spasms. Gabapentin nightly for restless leg. Capsaicin Cream PRN.   2. Hypertensive heart disease with other congestive heart failure (HCC) BP well-controlled at this time without medications, CTM.   3. Deep vein thrombosis (DVT) of proximal vein of both lower extremities, unspecified chronicity (HCC) Hx of DVT continue xarelto 20 mg daily.   5. Protein-calorie malnutrition, severe ([place]) Patient has lost 20 lbs in the last 6 months. Discussed concern. Patient endorses some decreased appetite due to gaseous sensation. Will continue to monitor for now. Nutritionist continues to follow, appreciate recs.   6. Mild recurrent major depression ([place]) Patient's mood is stable at this time. Continue duloxetine 60 mg daily. Continue attempts with GDR.   7. Neurogenic bowel Patient with concern for increased gas.She states simethicone helps some, however, she still feels like she has had inadequate defecation. 8 capfuls  of miralax w/ 32 ounces of gatorade for bowel clean out. Continue simethicone Prn. After this OTO, will continue other PRN  medications for constipation - lactulose, magnesium citrate, methylcellulose, miralaax, maalox.   8. Chronic Indwelling Catheter Increased sediment recently. Continue to encourage hydration.   Family/ staff Communication: Nursing  Labs/tests ordered:  Routine CBC, CMP

## 2022-02-12 NOTE — Progress Notes (Unsigned)
Location:  Other [place].  Nursing Home Room Number: [place] Place of Service:  SNF [phone]) Provider:  Dr. Amada Kingfisher, MD  Patient Care Team: Dewayne Shorter, MD as PCP - [facility] Medicine)  Extended Emergency Contact Information Primary Emergency Contact: [name] Mobile Phone: [phone] Relation: Daughter Secondary Emergency Contact: Lindi Adie Address: [address] [place]          [place], [place] [address] Johnnette Litter of [place] Phone: [phone] Relation: Spouse  Code Status:  Full Code Goals of care: Advanced Directive information    02/12/2022    9:40 AM  Advanced Directives  Does Patient Have a Medical Advance Directive? Yes  Type of Advance Directive [place]  Does patient want to make changes to medical advance directive? No - Patient declined  Copy of [place] in Chart? Yes - validated most recent copy scanned in chart (See row information)     Chief Complaint  Patient presents with   Medical Management of Chronic Issues    Medical Management of Chronic Issues.     HPI:  Pt is a 88 y.o. female seen today for medical management of chronic diseases.     Past Medical History:  Diagnosis Date   Acute blood loss anemia    Arthritis    Cancer ([place])    skin   Central cord syndrome at C6 level of cervical spinal cord ([place]) 11/29/2017   Hypertension    Protein-calorie malnutrition, severe ([place]) 01/24/2018   S/P insertion of IVC (inferior vena caval) filter 01/24/2018   Tetraparesis ([place])    Past Surgical History:  Procedure Laterality Date   ANTERIOR CERVICAL DECOMP/DISCECTOMY FUSION N/A 11/29/2017   Procedure: Cervical five-six, six-seven Anterior Cervical Decompression Fusion;  Surgeon: Judith Part, MD;  Location: [place];  Service: Neurosurgery;  Laterality: N/A;  Cervical five-six, six-seven Anterior Cervical Decompression Fusion   CATARACT  EXTRACTION     FEMUR IM NAIL Left 08/16/2021   Procedure: INTRAMEDULLARY (IM) NAIL FEMORAL;  Surgeon: Earnestine Leys, MD;  Location: [facility];  Service: Orthopedics;  Laterality: Left;   IR IVC FILTER PLMT / S&I /IMG GUID/MOD SED  12/08/2017    Allergies  Allergen Reactions   Other Swelling   Lisinopril-Hydrochlorothiazide Swelling    Swelling of the tongue   Beef-Derived Products Swelling    Facial and lip swelling (due to tick bite)   Dairy Aid [Tilactase] Swelling   Lisinopril-Hydrochlorothiazide Swelling    Tongue swelling   Pork-Derived Products Swelling    Outpatient Encounter Medications as of 02/12/2022  Medication Sig   acetaminophen (TYLENOL) 500 MG tablet Take 500 mg by mouth every 12 (twelve) hours as needed for mild pain.   acetaminophen (TYLENOL) 650 MG CR tablet Take 650 mg by mouth every 8 (eight) hours as needed for pain.   acetic acid 0.25 % irrigation Use 32m via irrigation two times daily   alendronate (FOSAMAX) 70 MG tablet Take 70 mg by mouth every Saturday.   alum & mag hydroxide-simeth (MAALOX/MYLANTA) 200-200-20 MG/5ML suspension Take 30 mLs by mouth every 4 (four) hours as needed for indigestion.   Calcium Carbonate Antacid (TUMS PO) Take one tablet by mouth twice daily   Capsaicin 0.1 % CREA Apply 1 application  topically every evening. (Apply to hands)   carboxymethylcellulose (REFRESH PLUS) 0.5 % SOLN Place 2 drops into both eyes every 6 (six) hours as needed (dry eyes).   cetirizine (ZYRTEC) 5  MG tablet Take 5 mg by mouth daily.   cholecalciferol (VITAMIN D3) 25 MCG (1000 UNIT) tablet Take 1,000 Units by mouth daily.   DULoxetine (CYMBALTA) 60 MG capsule Take 60 mg by mouth daily.   EPINEPHrine 0.3 mg/0.3 mL IJ SOAJ injection Inject 0.3 mg into the muscle as needed for anaphylaxis.   ferrous sulfate 325 (65 FE) MG tablet Take 325 mg by mouth 2 (two) times daily with a meal.   gabapentin (NEURONTIN) 100 MG capsule Take 1 capsule (100 mg total) by mouth  daily.   guaiFENesin (MUCINEX) 600 MG 12 hr tablet Take 600 mg by mouth 2 (two) times daily as needed for to loosen phlegm.   Infant Care Products [facility]) OINT Apply to buttocks/gluteal folds topically every shift.   lactulose, encephalopathy, (CHRONULAC) 10 GM/15ML SOLN Take 30 mLs by mouth daily as needed (mild constipation).   magnesium citrate solution Take 60-120 mLs by mouth daily as needed (constipation).   methylcellulose oral powder Take 1 packet by mouth at bedtime.   mometasone (ELOCON) 0.1 % lotion Apply 1 Application topically every Saturday at 6 PM. (Apply to the ear canal for eczema)   Multiple Vitamin (MULTIVITAMIN WITH MINERALS) TABS tablet Take 1 tablet by mouth daily.   polyethylene glycol (MIRALAX / GLYCOLAX) 17 g packet Take 17 g by mouth 2 (two) times daily.   rivaroxaban (XARELTO) 20 MG TABS tablet Take 1 tablet (20 mg total) by mouth daily with supper.   saccharomyces boulardii (FLORASTOR) 250 MG capsule Take 250 mg by mouth daily.   Saline Spray 0.2 % SOLN Place 2 sprays into both nostrils as needed (nasal congestion).   simethicone (MYLICON) 80 MG chewable tablet Chew 80 mg by mouth every 6 (six) hours as needed for flatulence.   sodium phosphate (FLEET) 7-19 GM/118ML ENEM Place 1 enema rectally every other day as needed for severe constipation.   spironolactone (ALDACTONE) 25 MG tablet Take 1 tablet (25 mg total) by mouth daily.   tiZANidine (ZANAFLEX) 2 MG tablet Take 1 tablet (2 mg total) by mouth 2 (two) times daily.   triamcinolone cream (KENALOG) 0.1 % Apply 1 Application topically 2 (two) times daily.   No facility-administered encounter medications on file as of 02/12/2022.    Review of Systems  Immunization History  Administered Date(s) Administered   Influenza, High Dose Seasonal PF 11/17/2021   Moderna Sars-Covid-2 Vaccination 12/11/2021   Tdap 11/29/2017   Pertinent  Health Maintenance Due  Topic Date Due   INFLUENZA VACCINE  Completed   DEXA  SCAN  Discontinued      08/18/2021   12:00 PM 08/18/2021    8:00 PM 08/19/2021   10:00 AM 08/19/2021    8:54 PM 08/20/2021    8:20 AM  Fall Risk  Patient Fall Risk Level High fall risk High fall risk High fall risk High fall risk High fall risk   Functional Status Survey:    Vitals:   02/12/22 0929  BP: (!) 158/93  Pulse: 89  Resp: 18  Temp: (!) 97 F (36.1 C)  SpO2: 96%  Weight: 168 lb 6.4 oz (76.4 kg)  Height: '5\' 7"'$  (1.702 m)   Body mass index is 26.38 kg/m. Physical Exam  Labs reviewed: Recent Labs    08/18/21 0423 08/19/21 0333 08/20/21 0443 09/24/21 0000  NA 124* 130* 130* 137  K 4.6 5.1 4.5 4.7  CL 95* 102 101 101  CO2 '23 23 26 '$ 30*  GLUCOSE 117* 117* 101*  --  BUN 29* '23 15 11  '$ CREATININE 0.96 0.72 0.54 0.5  CALCIUM 7.1* 7.4* 7.1* 8.5*   Recent Labs    09/24/21 0000  ALBUMIN 3.2*   Recent Labs    08/15/21 1035 08/16/21 1333 08/18/21 0423 08/18/21 1759 08/19/21 0333 08/20/21 0443 08/20/21 1029 11/26/21 0000  WBC 7.4   < > 13.0*  --  10.6* 8.9  --  6.3  NEUTROABS 5.2  --   --   --   --   --   --   --   HGB 12.9   < > 7.7*   < > 8.9* 7.8* 8.6* 10.5*  HCT 41.3   < > 22.7*   < > 27.5* 23.7*  --  32*  MCV 89.8   < > 87.6  --  88.4 87.1  --   --   PLT 335   < > 180  --  198 216  --  346   < > = values in this interval not displayed.   No results found for: "TSH" No results found for: "HGBA1C" No results found for: "CHOL", "HDL", "LDLCALC", "LDLDIRECT", "TRIG", "CHOLHDL"  Significant Diagnostic Results in last 30 days:  No results found.  Assessment/Plan There are no diagnoses linked to this encounter.   Family/ staff Communication: ***  Labs/tests ordered:  ***

## 2022-02-14 DIAGNOSIS — Z978 Presence of other specified devices: Secondary | ICD-10-CM | POA: Insufficient documentation

## 2022-02-14 DIAGNOSIS — I110 Hypertensive heart disease with heart failure: Secondary | ICD-10-CM | POA: Insufficient documentation

## 2022-02-15 ENCOUNTER — Ambulatory Visit: Payer: Medicare HMO

## 2022-02-15 ENCOUNTER — Ambulatory Visit: Payer: Medicare HMO | Admitting: Occupational Therapy

## 2022-02-15 DIAGNOSIS — M6281 Muscle weakness (generalized): Secondary | ICD-10-CM

## 2022-02-15 DIAGNOSIS — S72002A Fracture of unspecified part of neck of left femur, initial encounter for closed fracture: Secondary | ICD-10-CM

## 2022-02-15 DIAGNOSIS — R269 Unspecified abnormalities of gait and mobility: Secondary | ICD-10-CM

## 2022-02-15 DIAGNOSIS — R2689 Other abnormalities of gait and mobility: Secondary | ICD-10-CM

## 2022-02-15 DIAGNOSIS — R262 Difficulty in walking, not elsewhere classified: Secondary | ICD-10-CM

## 2022-02-15 DIAGNOSIS — R278 Other lack of coordination: Secondary | ICD-10-CM

## 2022-02-15 DIAGNOSIS — R2681 Unsteadiness on feet: Secondary | ICD-10-CM

## 2022-02-15 NOTE — Therapy (Signed)
Occupational Therapy Treatment Note    Patient Name: Sherry [name] MRN: [id number] DOB:August 21, 1934, 88 y.o., female Today's Date: 02/15/2022                                         PCP: Dr. Viviana Simpler REFERRING PROVIDER: Dr. Viviana Simpler   OT End of Session - 02/15/22 1320     Visit Number 31    Number of Visits 67    Date for OT Re-Evaluation 04/28/22    Authorization Time Period Progress report period starting 12/14/2021    OT Start Time 44    OT Stop Time 1145    OT Time Calculation (min) 45 min    Activity Tolerance Patient tolerated treatment well    Behavior During Therapy [facility] for tasks assessed/performed                 Past Medical History:  Diagnosis Date   Acute blood loss anemia    Arthritis    Cancer ([place])    skin   Central cord syndrome at C6 level of cervical spinal cord ([place]) 11/29/2017   Hypertension    Protein-calorie malnutrition, severe ([place]) 01/24/2018   S/P insertion of IVC (inferior vena caval) filter 01/24/2018   Tetraparesis ([place])    Past Surgical History:  Procedure Laterality Date   ANTERIOR CERVICAL DECOMP/DISCECTOMY FUSION N/A 11/29/2017   Procedure: Cervical five-six, six-seven Anterior Cervical Decompression Fusion;  Surgeon: Judith Part, MD;  Location: [place];  Service: Neurosurgery;  Laterality: N/A;  Cervical five-six, six-seven Anterior Cervical Decompression Fusion   CATARACT EXTRACTION     FEMUR IM NAIL Left 08/16/2021   Procedure: INTRAMEDULLARY (IM) NAIL FEMORAL;  Surgeon: Earnestine Leys, MD;  Location: [facility];  Service: Orthopedics;  Laterality: Left;   IR IVC FILTER PLMT / S&I /IMG GUID/MOD SED  12/08/2017   Patient Active Problem List   Diagnosis Date Noted   Hypertensive heart disease with other congestive heart failure ([place]) 02/14/2022   Chronic indwelling Foley catheter 02/14/2022   Closed hip fracture ([place]) 08/15/2021   DVT (deep venous thrombosis) ([place]) 08/15/2021   Quadriplegia ([place]) 08/15/2021    Fall 08/15/2021   Constipation due to slow transit 08/31/2018   Trauma 06/05/2018   Neuropathic pain 06/05/2018   Neurogenic bowel 06/05/2018   Vaginal yeast infection 01/30/2018   Healthcare-associated pneumonia 01/25/2018   Chronic allergic rhinitis 01/24/2018   Depression with anxiety 01/24/2018   UTI due to Klebsiella species 01/24/2018   Protein-calorie malnutrition, severe ([place]) 01/24/2018   S/P insertion of IVC (inferior vena caval) filter 01/24/2018   Tetraparesis ([place]) 01/20/2018   Neurogenic bladder 01/20/2018   Reactive depression    Benign essential HTN    Acute postoperative anemia due to expected blood loss    Central cord syndrome at C6 level of cervical spinal cord ([place]) 11/29/2017   Allergy to alpha-gal 11/25/2016   SCC (squamous cell carcinoma) 04/08/2014    REFERRING DIAG: Central Cord Syndrome at C6 of the Cervical Spinal Cord, Fall with Bilateral Closed Hip Fractures, with ORIF repair with Intramedullary Nailing of the Left Hip Fracture.  THERAPY DIAG:  Muscle weakness (generalized)  Rationale for Evaluation and Treatment Rehabilitation  PERTINENT HISTORY: Patient s/p fall November 29, 2017 resulting in diagnosis of central cord syndrome at C 6 level.  She has had therapy in multiple venues but with recent move to [facility]  [place], therapy staff recommended she seek outpatient therapy for her needs. Pt. Had a recent fall with Bilateral Closed Hip Fractures, with ORIF repair with Intramedullary Nailing of the Left Hip Fracture.   PRECAUTIONS: None  SUBJECTIVE:   Pt. reports feeling better overall.   PAIN:  Are you having pain? No     OBJECTIVE:   TODAY'S TREATMENT:   Manual Therapy:   Pt. tolerated soft tissue massage to the volar, and dorsal surface of each digit on the bilateral hands secondary to stiffness. Manual therapy was performed independent of, and in preparation for therapeutic Ex.   Therapeutic Ex.    Pt. worked on [employer], and  reciprocal motion using the UBE while seated for 8 min. with minimal resistance. Constant monitoring was provided. Pt. performed bilateral UE strengthening for bilateral shoulder flexion, chest presses, and elbow flexion, and extension. Pt. worked on bilateral UE strengthening. Pt. tolerated PROM followed by AROM bilateral wrist extension, PROM for bilateral digit MP, PIP, and DIP flexion, and extension, thumb radial, and palmar abduction.      Pt. Reports feeling better since last week. Pt. continues to make progress with AROM in right shoulder flexion, and bilateral UE strength. Pt. continues to try to engage her bilateral hands more during daily ADL, and IADL tasks. Pt. continues to tolerate There. Ex. on the UBE, and manual therapy well today. Pt. Continues to present with tightness at the bilateral thumb webspace, and bilateral wrist flexor tightness, as well as digit MP, PIP, and DIP extensor tightness which continues to limit her ability to complete ADL tasks efficiently. Pt. continues to benefit from working on impoving ROM in order to work towards increasing bilateral hand grasp on objects, and increase engagement of her bilateral hands during ADLs, and IADL tasks.   PATIENT EDUCATION: Education details: BUE ROM, hand function, UE functioning Person educated: Patient Education method: Explanation and Demonstration Education comprehension: verbalized understanding, returned demonstration, and needs further education   HOME EXERCISE PROGRAM Continue with ongoing HEPs  Measurements:  12/09/2021:  Shoulder flexion: R: 120(136), L: 138(150) Shoulder abduction: R: 108(120), L: 110(120) Elbow: R: 0-148, L: 0-146 Wrist flexion: R: 55(62), L: 78 Writ extension: R: 34(55), L: 3(4) RD: R: 12(20), L: 16(26) UD: R: 8(24), L: 6(18) Thumb radial abduction: R: 11(20), L: 8(20) Digit flexion to the [facility]: R:  2nd: 5cm(3.5cm), 3rd: 7.5 cm(5cm), 4th: 7cm(3cm), 5th: 6cm(5.5cm) L: 2nd: 8cm(8cm), 3rd:  8cm(8cm), 4th: 7.5cm(7cm), 5th: 7cm(7cm)  02/03/2022:   Shoulder flexion: R: 122(138), L: 148(154) Shoulder abduction: R: 109(125), L: 125(125 Elbow: R: 0-148, L: 0-146 Wrist flexion: R: 60(68), L: 79 Writ extension: R: 40(55), L: 3(10) RD: R: 14(24), L: 20(28) UD: R: 8(24), L: 6(18) Thumb radial abduction: R: 20(25), L: 8(20) Digit flexion to the [facility]: R:  2nd: 4.5cm(3cm), 3rd: 6.5cm(4.5cm), 4th: 6.5 cm(3cm), 5th: 4.5cm(5cm) L: 2nd: 8cm(8cm), 3rd: 8cm(7cm), 4th: 7.5cm(7cm), 5th: 6.5cm(6.5cm)            OT Long Term Goals - 07/06/21 1106  Target Date: 04/28/2022    OT LONG TERM GOAL #1   Title Patient and caregiver will demonstrate understanding of home exercise program for ROM.    Baseline 12/09/2021: Pt. Reports the restorative ROM program has not resumed at the facility. 10/28/2021: Pt. Is attempting exercises/stretches at home. The facility restorative aides have not resumed exercises. 09/14/2021:  Pt. Reports inconsistencies with restorative ROM at  her care home. Pt/caregiver education to be provided about ongoing ROM needs.    Time 12    Period Weeks    Status Deferred     OT LONG TERM GOAL #2   Title Pt.  will improve BUE strength to be able to sustain her UE's in elevation to efficiently, and effectively perform hair care, and self-grooming tasks.   Baseline 02/03/2022: Pt. has improved, and is now able to sustain her Bilateral UEs in elevation long enough to efficiently, and effectively perform haircare, and self-grooming tasks. 12/09/2021: right shoulder 4-/5, left shoulder 4/5. Pt. has improved to be able to reach up for hair care, and self-grooming tasks. 10/28/2021: Right shoulder 3+/5, Left shoulder 4-/5 09/14/2021:  BUE shoulder strength: 3+/5 for shoulder flexion, and abduction.   Time 12    Period Weeks    Status Achieved               OT LONG TERM GOAL #3   Title Patient will  demonstrate improved composite finger flexion to hold deodorant to apply to underarms.    Baseline 02/03/2022: Pt. Has improved with digit flexion, however continues to have difficulty securely holding and using deoderant. 12/09/2021: Bilateral hand/digit MP, PIP, and DIP extension tightness limits her ability achieve digit flexion to hold, and apply deodorant. 10/28/2021:  Pt. Continues to have difficulty holding the deodorant. 01/12/2021: Pt. Presents with limited digit extension. Pt. Is able to initiate holding deodorant, however is unable to hold it while using it.   Time 12    Period Weeks    Status Ongoing   Target Date                  OT LONG TERM GOAL #4   Title Pt. Will improve bilateral wrist extension in preparation for anticipating, and initiating reaching for objects at the table.    Baseline 02/03/2022: Right: 34(55) Left: 3(4) 12/09/2021: Right  34(55)  Left  3(4) 10/28/2021:  Right 22(38), Left 0(15) 09/14/2021:  Right: 17(35), left 2(15)   Time 12   Period weeks   Status Ongoing   Target Date      OT LONG TERM GOAL #5   Title Pt. will write, and sign her name with 100% legibility, and modified independence.    Baseline 02/03/2022: Pt. Continues to report legibility appears "shaky" when attempting signing her name. Pt.reports that she is writing more when filling out her menu, and complete puzzles.12/09/2021: Pt. Reports legibility appears "shaky" when attempting signing her name. Pt.reports that she is writing more when filling out her menu. 10/28/2021: pt. is able to sign her name, however legibility continues to be inconsistent. Pt. Reports "shaky appearance". 09/14/2021: Pt. continues to fill out her daily menu and complete puzzles. Pt. Continues to present with limited writing legibility.   Time 12    Period Weeks    Status  Ongoing         OT LONG TERM GOAL  #6   TITLE Pt. will turn pages in a book with modified independence.    Baseline 02/03/2022: Pt. Is able to  effectively turn pages of a book. 10/28/2021: Pt. Is able to consistently turn book pages. 09/14/2021: Pt. has difficulty turning pages.    Time 12    Period Weeks    Status Achieved       OT LONG TERM GOAL  #7  TITLE Pt. Will increase  bilateral lateral pinch strength by 2 lbs to be able to securely grasp items during ADLs, and IADL tasks.   Baseline 02/03/2022: Right: 6#, Left: 4# 12/09/2021:  Right: 6#, Left: 4#   Time 12  Period Weeks  Status Ongoing           OT LONG TERM GOAL  #8  TITLE Pt. Will button a sweater with large 1" buttons, and zip her jacket with modified independence.   Baseline 02/03/2022: Pt. is unable to manipulate buttons on her sweater, or zippers on her jacket.  Time 12  Period Weeks  Status  New   OT LONG TERM GOAL  #9  TITLE Pt. will complete plant care with modified independence.  Baseline 02/03/2022: Pt. has difficulty caring for her plants.   Time 12  Period Weeks  Status New   OT LONG TERM GOAL  #10  TITLE Pt. will demonstrate adaptive techniques to assist with the efficiency of self-dressing, or morning care tasks.  Baseline 02/03/2022: Pt. Requires assist from caregivers 2/2 time limitations during morning care.   Time 12  Period Weeks  Status New     Plan - 07/22/21 1737     Clinical Impression Statement Pt. reports feeling better since last week. Pt. continues to make progress with AROM in right shoulder flexion, and bilateral UE strength. Pt. continues to try to engage her bilateral hands more during daily ADL, and IADL tasks. Pt. continues to tolerate Ther. Ex. on the UBE, and manual therapy well today. Pt. Continues to present with tightness at the bilateral thumb webspace, and bilateral wrist flexor tightness, as well as digit MP, PIP, and DIP extensor tightness which continues to limit her ability to complete ADL tasks efficiently. Pt. continues to benefit from working on impoving ROM in order to work towards increasing bilateral hand grasp  on objects, and increase engagement of her bilateral hands during ADLs, and IADL tasks.                   OT Occupational Profile and History Detailed Assessment- Review of Records and additional review of physical, cognitive, psychosocial history related to current functional performance    Occupational performance deficits (Please refer to evaluation for details): ADL's;IADL's;Leisure;Social Participation    Body Structure / Function / Physical Skills ADL;Continence;Dexterity;Flexibility;Strength;ROM;Balance;Coordination;FMC;IADL;Endurance;UE functional use;Decreased knowledge of use of DME;[facility]    Psychosocial Skills Coping Strategies    Rehab Potential Fair    Clinical Decision Making Several treatment options, min-mod task modification necessary    Comorbidities Affecting Occupational Performance: Presence of comorbidities impacting occupational performance    Comorbidities impacting occupational performance description: contractures of bilateral hands, dependent transfers,    Modification or Assistance to Complete Evaluation  No modification of tasks or assist necessary to complete eval    OT Frequency 2x / week    OT Duration 12 weeks    OT Treatment/Interventions Self-care/ADL training;Cryotherapy;Therapeutic exercise;DME and/or AE instruction;Balance training;Neuromuscular education;Manual Therapy;Splinting;Moist Heat;Passive range of motion;Therapeutic activities;Patient/family education    Plan continue to progress ROM of digits, wrists and shoulders as it pertains to completion of ADL tasks that pt values.    Consulted and Agree with Plan of Care Patient  Harrel Carina, MS, OTR/L  Harrel Carina, OT 02/15/2022, 1:25 PM      `

## 2022-02-15 NOTE — Therapy (Signed)
OUTPATIENT PHYSICAL THERAPY NEURO TREATMENT   Patient Name: Sherry [name] MRN: [id number] DOB:11/08/1934, 88 y.o., female Today's Date: 02/15/2022   PCP: Sherry Shorter, MD REFERRING PROVIDER: Earnestine [name]   PT End of Session - 02/15/22 1302     Visit Number 25    Number of Visits 43    Date for PT Re-Evaluation 04/05/22    Authorization Time Period Initial cert 02/02/5850-77/82/4235; Recert 36/14/4315-4/0/0867    Progress Note Due on Visit 30    PT Start Time 1149    PT Stop Time 1222    PT Time Calculation (min) 33 min    Equipment Utilized During Treatment Gait belt    Activity Tolerance Patient tolerated treatment well    Behavior During Therapy WFL for tasks assessed/performed                         Past Medical History:  Diagnosis Date   Acute blood loss anemia    Arthritis    Cancer ([place])    skin   Central cord syndrome at C6 level of cervical spinal cord ([place]) 11/29/2017   Hypertension    Protein-calorie malnutrition, severe ([place]) 01/24/2018   S/P insertion of IVC (inferior vena caval) filter 01/24/2018   Tetraparesis ([place])    Past Surgical History:  Procedure Laterality Date   ANTERIOR CERVICAL DECOMP/DISCECTOMY FUSION N/A 11/29/2017   Procedure: Cervical five-six, six-seven Anterior Cervical Decompression Fusion;  Surgeon: Sherry Part, MD;  Location: [place];  Service: Neurosurgery;  Laterality: N/A;  Cervical five-six, six-seven Anterior Cervical Decompression Fusion   CATARACT EXTRACTION     FEMUR IM NAIL Left 08/16/2021   Procedure: INTRAMEDULLARY (IM) NAIL FEMORAL;  Surgeon: Sherry Leys, MD;  Location: [facility];  Service: Orthopedics;  Laterality: Left;   IR IVC FILTER PLMT / S&I /IMG GUID/MOD SED  12/08/2017   Patient Active Problem List   Diagnosis Date Noted   Hypertensive heart disease with other congestive heart failure ([place]) 02/14/2022   Chronic indwelling Foley catheter 02/14/2022   Closed hip fracture ([place])  08/15/2021   DVT (deep venous thrombosis) ([place]) 08/15/2021   Quadriplegia ([place]) 08/15/2021   Fall 08/15/2021   Constipation due to slow transit 08/31/2018   Trauma 06/05/2018   Neuropathic pain 06/05/2018   Neurogenic bowel 06/05/2018   Vaginal yeast infection 01/30/2018   Healthcare-associated pneumonia 01/25/2018   Chronic allergic rhinitis 01/24/2018   Depression with anxiety 01/24/2018   UTI due to Klebsiella species 01/24/2018   Protein-calorie malnutrition, severe ([place]) 01/24/2018   S/P insertion of IVC (inferior vena caval) filter 01/24/2018   Tetraparesis ([place]) 01/20/2018   Neurogenic bladder 01/20/2018   Reactive depression    Benign essential HTN    Acute postoperative anemia due to expected blood loss    Central cord syndrome at C6 level of cervical spinal cord ([place]) 11/29/2017   Allergy to alpha-gal 11/25/2016   SCC (squamous cell carcinoma) 04/08/2014    ONSET DATE: 08/15/2021 (fall with B hip fx); Initial injury was in 2019- quadraplegia due to central cord syndrom of C6 secondary to neck fracture.   REFERRING DIAG: Bilateral Hip fx; Left Open- s/p Left IM nail femoral on 08/16/2021; Right closed  THERAPY DIAG:  Muscle weakness (generalized)  Other lack of coordination  Abnormality of gait and mobility  Difficulty in walking, not elsewhere classified  Other abnormalities of gait and mobility  Closed fracture of both hips, initial encounter (HCC)  Unsteadiness on feet  Rationale  for Evaluation and Treatment Rehabilitation  SUBJECTIVE:                                                                                                                                                                                              SUBJECTIVE STATEMENT:    Patient reports still not sleeping well but denies any new issues since last session.      Pt accompanied by:  Paid caregiver-   PERTINENT HISTORY: Patient was previous patient (see previous episode for  specifics) She experienced a fall at home on 08/15/2021 and was in [facility] from 08/15/2021- 08/20/2021.  Most recent history per Sherry [name] on 08/20/2021.  Sherry [name] is a 88 y.o. 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   Patient has history of quadriplegia, and has been doing PT/OT with improvement of function. Pt has some use of both arms and can feed herself. She can walk a little bit with assistance. Pt accidentally fell at about 930 this morning after taking shower.  She injured her hips. Hospital Course:    Closed left hip fracture [facility]) s/p mechanical fall Closed right hip fracture (?old) Acute blood loss anemia --pt is s/p  INTRAMEDULLARY (IM) NAIL FEMORAL-left on 7/16  PAIN:  Are you having pain? NO: NPRS scale: 0/10 Pain location: Right hip Pain description: Ache Aggravating factors: any active Movement of right side Relieving factors: Rest  PRECAUTIONS: Fall  WEIGHT BEARING RESTRICTIONS Yes Bilateral non-weight bearing B LE's  FALLS: Has patient fallen in last 6 months? Yes. Number of falls 1  LIVING ENVIRONMENT: Lives with: lives with their family and lives in a nursing home Lives in: Other Long term care at [place]: No Has following equipment at home: Wheelchair (power), Ramped entry, and hoyer lift  PLOF: Needs assistance with ADLs, Needs assistance with homemaking, Needs assistance with gait, Needs assistance with transfers, and Leisure: Work puzzles, read, Watch TV  PATIENT GOALS Patient goal is to walk; Be able to strengthen my arms for better use  OBJECTIVE:   DIAGNOSTIC FINDINGS: Overall cognitive statusEXAM: DG HIP (WITH OR WITHOUT PELVIS) 2-3V LEFT; DG HIP (WITH OR WITHOUT PELVIS) 2-3V RIGHT   COMPARISON:  CT  of the abdomen and pelvis 01/21/2020   FINDINGS: Left proximal femur fracture noted just below the intertrochanteric region. Marked ventral angulation noted on the cross-table image. Slight varus angulation present   Comminuted intratrochanteric fracture present  on the right also with marked ventral angulation.   IMPRESSION: 1. Bilateral hip fractures. 2. Comminuted intertrochanteric fracture of the right hip. 3. Angulated fracture of the proximal left femur just below the intertrochanteric region.     Electronically Signed   By: San Morelle M.D.   On: 08/15/2021 11:54:   COGNITION:  Within functional limits for tasks assessed   SENSATION: Light touch: Impaired   EDEMA:  None  Strength R/L 2-/2- Shoulder flexion (anterior deltoid/pec major/coracobrachialis, axillary n. (C5-6) and musculocutaneous n. (C5-7)) 2-/3+ (within available ROM around 130 on lef)Shoulder abduction (deltoid/supraspinatus, axillary/suprascapular n, C5) 3+/3+ Shoulder external rotation (infraspinatus/teres minor) 4/4 Shoulder internal rotation (subcapularis/lats/pec major) 3+/3+ Shoulder extension (posterior deltoid, lats, teres major, axillary/thoracodorsal n.) 3+/3+ Shoulder horizontal abduction 4/4 Elbow flexion (biceps brachii, brachialis, brachioradialis, musculoskeletal n, C5-6) 4/4 Elbow extension (triceps, radial n, C7) 2-/2- Wrist Extension 3+/3+ Wrist Flexion   LOWER EXTREMITY MMT:    MMT Right 01/11/22 Left 01/11/22  Hip flexion 2- 2-  Hip extension 2- 2-  Hip abduction 2- 2-  Hip adduction 2- 3  Hip internal rotation 2- 2-  Hip external rotation 2- 2-  Knee flexion 2- 2-  Knee extension 2+ 3+  Ankle dorsiflexion 2- NT - boot  Ankle plantarflexion 2- NT- boot  Ankle inversion 2- NT- boot  Ankle eversion 2- NT- boot  (Blank rows = not tested)  BED MOBILITY:  Unable to test due to NWB status of BLE- Patient currently dependent with all bed mobility using hoyer and  caregiver assist  TRANSFERS: Currently BLE NWB- Dependent on use of hoyer for all transfers  FUNCTIONAL TESTs:   PATIENT SURVEYS:  FOTO 12  TODAY'S TREATMENT:   -COPIED FROM [facility]-DR. H. MILLER note from 12/30/2021 12/30/2021 12/30/2021 Assessment and Plan:  Impression: 1. Bilateral hip fractures.  Recommendation: The patient will continue the current program until 02/01/2022 when she may try some weightbearing exercises as well. The patient will follow up in 03/2022 for exam and x-ray of both hips.  ATTESTATION:  This note has been generated by Romero Liner and edited by Christiana Fuchs, Quality Documentation Specialist.     - COPIED FROM [facility]- DR. Jonny Ruiz Note from 11/18/2021:  11/18/2021 11/18/2021 Assessment and Plan:  Bilateral hip fractures.   Recommendation:  The patient's PT may start with range of motion and more activity with the hips. I would like to avoid weightbearing still. We will see her back here in 6 weeks for an exam and x-ray of both hips.     ** Per above instructions - now able to begin some weight bearing activities.  THERAPEUTIC ACTIVITIES:   - Sit to stand in // bars x 4 trials- Mod to max Assist of 1 VC for posture/glute activation  in standing using mirror as feedback. Patient able to extend hips with cues and use UE for support as well.   - Standing in // bars- max assist at trunk  1) 1 MIN 2) 45 sec  3) 38 sec 4) 1MIN 04 sec    Patient required VC and increased physical assist to keep trunk extended- knees blocked yet no buckling. Continued left knee hyperextension with weight bearing.   THEREX: In power w/c    Passive stretching to B ankle DF/PF and B hamstrings- 4 min total       PATIENT EDUCATION: Education details: PT plan of care/use of resistive bands Person educated: Patient Education method: Explanation, Demonstration, Tactile cues, and Verbal cues Education  comprehension: verbalized understanding,  returned demonstration, verbal cues required, tactile cues required, and needs further education   HOME EXERCISE PROGRAM: Discussed use of theraband and demo shoulder flex/abd and elbow flex and ext with use of yellow TB. Patient required assist to position therband but able to perform well with only one person assist  Access Code: [facility] URL: [email or web address] Date: 11/23/2021 Prepared by: Janna Arch Exercises - Seated Gluteal Sets - 2 x daily - 7 x weekly - 2 sets - 10 reps - 5 hold - Seated Quad Set - 2 x daily - 7 x weekly - 2 sets - 10 reps - 5 hold - Seated Long Arc Quad - 2 x daily - 7 x weekly - 2 sets - 10 reps - 5 hold - Seated March - 2 x daily - 7 x weekly - 2 sets - 10 reps - 5 hold - Seated Hip Abduction - 2 x daily - 7 x weekly - 2 sets - 10 reps - 5 hold - Seated Shoulder Shrugs - 2 x daily - 7 x weekly - 2 sets - 10 reps - 5 hold - Wheelchair Pressure Relief - 2 x daily - 7 x weekly - 2 sets - 10 reps - 5 hold  GOALS: Goals reviewed with patient? Yes  SHORT TERM GOALS: Target date: 02/22/2022  Pt will be independent with initial UE strengthening HEP in order to improve strength and balance in order to decrease fall risk and improve function at home and work. Baseline: 10/19/2021- patient with no formal UE HEP; 12/02/2021= Patient verbalized knowledge of HEP including use of theraband for UE strengthening.  Goal status: GOAL MET  LONG TERM GOALS: Target date: 04/05/2022  Pt will be independent with final for UE/LE HEP in order to improve strength and balance in order to decrease fall risk and improve function at home and work. Baseline: Patient is currently BLE NWB and unable to participate in HEP. Has order for UE strengthening. 01/11/2022- Patient now able to participate in LE strengthening although NWB still- good understanding for some basic exercises. Will keep goal active to incorporate progressive LE strengthening exercises. Goal status: Progressing  2.   Pt will improve FOTO to target score of 35  to display perceived improvements in ability to complete ADL's.  Baseline: 10/19/2021= 12; 12/02/2021= 17; 01/11/2022=17 Goal status: Ongoing  3.  Pt will increase strength of B UE  by at least 1/2 MMT grade in order to demonstrate improvement in strength and function  Baseline: patient has range of 2-/5 to 4/5 BUE Strength; 12/02/2021= 4/5 except with wrist ext. 01/11/2022= 4/5 B UE strength expept for wrist Goal status: Partially met  ASSESSMENT:  CLINICAL IMPRESSION:  Patient presented with good motivation despite stating continued fatigue from not sleeping well. She was able to progress in standing - both with her endurance and posture- able to facilitate more glutes and position her hips more over her feet. Pt will continue to benefit from skilled PT services to improve strength in anticipation for BLE clearance for [address] activities   OBJECTIVE IMPAIRMENTS Abnormal gait, decreased activity tolerance, decreased balance, decreased coordination, decreased endurance, decreased mobility, difficulty walking, decreased ROM, decreased strength, hypomobility, impaired flexibility, impaired UE functional use, postural dysfunction, and pain.   ACTIVITY LIMITATIONS carrying, lifting, bending, sitting, standing, squatting, sleeping, stairs, transfers, bed mobility, continence, bathing, toileting, dressing, self feeding, reach over head, hygiene/grooming, and caring for others  PARTICIPATION LIMITATIONS: meal prep, cleaning, laundry, medication management, personal finances, interpersonal relationship,  driving, shopping, community activity, and yard work  PERSONAL FACTORS Age, Time since onset of injury/illness/exacerbation, and 1-2 comorbidities: HTN, cervical Sx  are also affecting patient's functional outcome.   REHAB POTENTIAL: Good  CLINICAL DECISION MAKING: Evolving/moderate complexity  EVALUATION COMPLEXITY: Moderate  PLAN: PT FREQUENCY:  1-2x/week  PT DURATION: 12 weeks  PLANNED INTERVENTIONS: Therapeutic exercises, Therapeutic activity, Neuromuscular re-education, Balance training, Gait training, Patient/Family education, Self Care, Joint mobilization, DME instructions, Dry Needling, Electrical stimulation, Wheelchair mobility training, Spinal mobilization, Cryotherapy, Moist heat, and Manual therapy  PLAN FOR NEXT SESSION: Continue with weight bearing activities and LE strengthening as appropriate.    Ollen Bowl, PT 4:29 PM,02/15/22  Physical Therapist- [facility]  02/15/2022, 4:29 PM

## 2022-02-17 ENCOUNTER — Ambulatory Visit: Payer: Medicare HMO

## 2022-02-17 ENCOUNTER — Ambulatory Visit: Payer: Medicare HMO | Admitting: Occupational Therapy

## 2022-02-22 ENCOUNTER — Ambulatory Visit: Payer: Medicare HMO

## 2022-02-22 ENCOUNTER — Ambulatory Visit: Payer: Medicare HMO | Admitting: Occupational Therapy

## 2022-02-22 ENCOUNTER — Encounter: Payer: Self-pay | Admitting: Occupational Therapy

## 2022-02-22 DIAGNOSIS — R269 Unspecified abnormalities of gait and mobility: Secondary | ICD-10-CM

## 2022-02-22 DIAGNOSIS — R278 Other lack of coordination: Secondary | ICD-10-CM

## 2022-02-22 DIAGNOSIS — R262 Difficulty in walking, not elsewhere classified: Secondary | ICD-10-CM

## 2022-02-22 DIAGNOSIS — M6281 Muscle weakness (generalized): Secondary | ICD-10-CM

## 2022-02-22 DIAGNOSIS — R2689 Other abnormalities of gait and mobility: Secondary | ICD-10-CM

## 2022-02-22 NOTE — Therapy (Signed)
OUTPATIENT PHYSICAL THERAPY NEURO TREATMENT   Patient Name: Sherry [name] MRN: [id number] DOB:July 30, 1934, 88 y.o., female Today's Date: 02/22/2022   PCP: Dewayne Shorter, MD REFERRING PROVIDER: Earnestine Leys   PT End of Session - 02/22/22 1150     Visit Number 26    Number of Visits 43    Date for PT Re-Evaluation 04/05/22    Authorization Time Period Initial cert 6/71/2458-09/98/3382; Recert 50/53/9767-04/05/1935    Progress Note Due on Visit 30    PT Start Time 1146    PT Stop Time 1220    PT Time Calculation (min) 34 min    Equipment Utilized During Treatment Gait belt    Activity Tolerance Patient tolerated treatment well    Behavior During Therapy WFL for tasks assessed/performed                         Past Medical History:  Diagnosis Date   Acute blood loss anemia    Arthritis    Cancer ([place])    skin   Central cord syndrome at C6 level of cervical spinal cord ([place]) 11/29/2017   Hypertension    Protein-calorie malnutrition, severe ([place]) 01/24/2018   S/P insertion of IVC (inferior vena caval) filter 01/24/2018   Tetraparesis ([place])    Past Surgical History:  Procedure Laterality Date   ANTERIOR CERVICAL DECOMP/DISCECTOMY FUSION N/A 11/29/2017   Procedure: Cervical five-six, six-seven Anterior Cervical Decompression Fusion;  Surgeon: Judith Part, MD;  Location: [facility];  Service: Neurosurgery;  Laterality: N/A;  Cervical five-six, six-seven Anterior Cervical Decompression Fusion   CATARACT EXTRACTION     FEMUR IM NAIL Left 08/16/2021   Procedure: INTRAMEDULLARY (IM) NAIL FEMORAL;  Surgeon: Earnestine Leys, MD;  Location: [facility];  Service: Orthopedics;  Laterality: Left;   IR IVC FILTER PLMT / S&I /IMG GUID/MOD SED  12/08/2017   Patient Active Problem List   Diagnosis Date Noted   Hypertensive heart disease with other congestive heart failure ([place]) 02/14/2022   Chronic indwelling Foley catheter 02/14/2022   Closed hip fracture ([place])  08/15/2021   DVT (deep venous thrombosis) ([place]) 08/15/2021   Quadriplegia ([place]) 08/15/2021   Fall 08/15/2021   Constipation due to slow transit 08/31/2018   Trauma 06/05/2018   Neuropathic pain 06/05/2018   Neurogenic bowel 06/05/2018   Vaginal yeast infection 01/30/2018   Healthcare-associated pneumonia 01/25/2018   Chronic allergic rhinitis 01/24/2018   Depression with anxiety 01/24/2018   UTI due to Klebsiella species 01/24/2018   Protein-calorie malnutrition, severe ([place]) 01/24/2018   S/P insertion of IVC (inferior vena caval) filter 01/24/2018   Tetraparesis ([place]) 01/20/2018   Neurogenic bladder 01/20/2018   Reactive depression    Benign essential HTN    Acute postoperative anemia due to expected blood loss    Central cord syndrome at C6 level of cervical spinal cord ([place]) 11/29/2017   Allergy to alpha-gal 11/25/2016   SCC (squamous cell carcinoma) 04/08/2014    ONSET DATE: 08/15/2021 (fall with B hip fx); Initial injury was in 2019- quadraplegia due to central cord syndrom of C6 secondary to neck fracture.   REFERRING DIAG: Bilateral Hip fx; Left Open- s/p Left IM nail femoral on 08/16/2021; Right closed  THERAPY DIAG:  Muscle weakness (generalized)  Other lack of coordination  Abnormality of gait and mobility  Difficulty in walking, not elsewhere classified  Other abnormalities of gait and mobility  Rationale for Evaluation and Treatment Rehabilitation  SUBJECTIVE:  SUBJECTIVE STATEMENT: Pt reports no concerns or issues since last visit. No falls.       Pt accompanied by:  Paid caregiver-   PERTINENT HISTORY: Patient was previous patient (see previous episode for specifics) She experienced a fall at home on 08/15/2021 and was in [facility] from 08/15/2021- 08/20/2021.  Most  recent history per Dr. Laurey Arrow on 08/20/2021.  Sherry [name] is a 88 y.o. 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   Patient has history of quadriplegia, and has been doing PT/OT with improvement of function. Pt has some use of both arms and can feed herself. She can walk a little bit with assistance. Pt accidentally fell at about 930 this morning after taking shower.  She injured her hips. Hospital Course:    Closed left hip fracture [facility]) s/p mechanical fall Closed right hip fracture (?old) Acute blood loss anemia --pt is s/p  INTRAMEDULLARY (IM) NAIL FEMORAL-left on 7/16  PAIN:  Are you having pain? NO: NPRS scale: 0/10 Pain location: Right hip Pain description: Ache Aggravating factors: any active Movement of right side Relieving factors: Rest  PRECAUTIONS: Fall  WEIGHT BEARING RESTRICTIONS Yes Bilateral non-weight bearing B LE's  FALLS: Has patient fallen in last 6 months? Yes. Number of falls 1  LIVING ENVIRONMENT: Lives with: lives with their family and lives in a nursing home Lives in: Other Long term care at [place]: No Has following equipment at home: Wheelchair (power), Ramped entry, and hoyer lift  PLOF: Needs assistance with ADLs, Needs assistance with homemaking, Needs assistance with gait, Needs assistance with transfers, and Leisure: Work puzzles, read, Watch TV  PATIENT GOALS Patient goal is to walk; Be able to strengthen my arms for better use  OBJECTIVE:   DIAGNOSTIC FINDINGS: Overall cognitive statusEXAM: DG HIP (WITH OR WITHOUT PELVIS) 2-3V LEFT; DG HIP (WITH OR WITHOUT PELVIS) 2-3V RIGHT   COMPARISON:  CT of the abdomen and pelvis 01/21/2020   FINDINGS: Left proximal femur fracture noted just below the  intertrochanteric region. Marked ventral angulation noted on the cross-table image. Slight varus angulation present   Comminuted intratrochanteric fracture present on the right also with marked ventral angulation.   IMPRESSION: 1. Bilateral hip fractures. 2. Comminuted intertrochanteric fracture of the right hip. 3. Angulated fracture of the proximal left femur just below the intertrochanteric region.     Electronically Signed   By: San Morelle M.D.   On: 08/15/2021 11:54:   COGNITION:  Within functional limits for tasks assessed   SENSATION: Light touch: Impaired   EDEMA:  None  Strength R/L 2-/2- Shoulder flexion (anterior deltoid/pec major/coracobrachialis, axillary n. (C5-6) and musculocutaneous n. (C5-7)) 2-/3+ (within available ROM around 130 on lef)Shoulder abduction (deltoid/supraspinatus, axillary/suprascapular n, C5) 3+/3+ Shoulder external rotation (infraspinatus/teres minor) 4/4 Shoulder internal rotation (subcapularis/lats/pec major) 3+/3+ Shoulder extension (posterior deltoid, lats, teres major, axillary/thoracodorsal n.) 3+/3+ Shoulder horizontal abduction 4/4 Elbow flexion (biceps brachii, brachialis, brachioradialis, musculoskeletal n, C5-6) 4/4 Elbow extension (triceps, radial n, C7) 2-/2- Wrist Extension 3+/3+ Wrist Flexion   LOWER EXTREMITY MMT:    MMT Right 01/11/22 Left 01/11/22  Hip flexion 2- 2-  Hip extension 2- 2-  Hip abduction 2- 2-  Hip adduction 2- 3  Hip internal rotation 2- 2-  Hip external rotation 2- 2-  Knee flexion  2- 2-  Knee extension 2+ 3+  Ankle dorsiflexion 2- NT - boot  Ankle plantarflexion 2- NT- boot  Ankle inversion 2- NT- boot  Ankle eversion 2- NT- boot  (Blank rows = not tested)  BED MOBILITY:  Unable to test due to NWB status of BLE- Patient currently dependent with all bed mobility using hoyer and caregiver assist  TRANSFERS: Currently BLE NWB- Dependent on use of hoyer for all  transfers  FUNCTIONAL TESTs:   PATIENT SURVEYS:  FOTO 12  TODAY'S TREATMENT:   -COPIED FROM [facility]-DR. H. MILLER note from 12/30/2021 12/30/2021 12/30/2021 Assessment and Plan:  Impression: 1. Bilateral hip fractures.  Recommendation: The patient will continue the current program until 02/01/2022 when she may try some weightbearing exercises as well. The patient will follow up in 03/2022 for exam and x-ray of both hips.  ATTESTATION:  This note has been generated by Romero Liner and edited by Christiana Fuchs, Quality Documentation Specialist.     - COPIED FROM [facility]- DR. Jonny Ruiz Note from 11/18/2021:  11/18/2021 11/18/2021 Assessment and Plan:  Bilateral hip fractures.   Recommendation:  The patient's PT may start with range of motion and more activity with the hips. I would like to avoid weightbearing still. We will see her back here in 6 weeks for an exam and x-ray of both hips.     ** Per above instructions - now able to begin some weight bearing activities.  THERE.EX:    STS in // bars: 4 trials maxA+1. Pt able to independently operate power chair to help scoot anteriorly to edge of sit. minA at LE's to assist in shifting. Each attempt pt maintaining ~15 to 20 second increments with PT manually blocking LLE as pt has posterior bias in standing causing LLE to slide anteriorly.     In standing pt able to use Ue's on // bars to support and assist once out of seat in glut activation to stand with VC's.    AAROM exercises seated in powerchair:  Hip flexion: x15/LE AAROM   Hip abduction: x15/LE AAROM     PATIENT EDUCATION: Education details: PT plan of care/use of resistive bands Person educated: Patient Education method: Explanation, Demonstration, Tactile cues, and Verbal cues Education comprehension: verbalized understanding, returned demonstration, verbal cues required, tactile cues required, and needs further education   HOME EXERCISE  PROGRAM: Discussed use of theraband and demo shoulder flex/abd and elbow flex and ext with use of yellow TB. Patient required assist to position therband but able to perform well with only one person assist  Access Code: [facility] URL: [email or web address] Date: 11/23/2021 Prepared by: Janna Arch Exercises - Seated Gluteal Sets - 2 x daily - 7 x weekly - 2 sets - 10 reps - 5 hold - Seated Quad Set - 2 x daily - 7 x weekly - 2 sets - 10 reps - 5 hold - Seated Long Arc Quad - 2 x daily - 7 x weekly - 2 sets - 10 reps - 5 hold - Seated March - 2 x daily - 7 x weekly - 2 sets - 10 reps - 5 hold - Seated Hip Abduction - 2 x daily - 7 x weekly - 2 sets - 10 reps - 5 hold - Seated Shoulder Shrugs - 2 x daily - 7 x weekly - 2 sets - 10 reps - 5 hold - Wheelchair Pressure Relief - 2 x daily - 7 x weekly - 2 sets - 10 reps - 5  hold  GOALS: Goals reviewed with patient? Yes  SHORT TERM GOALS: Target date: 02/22/2022  Pt will be independent with initial UE strengthening HEP in order to improve strength and balance in order to decrease fall risk and improve function at home and work. Baseline: 10/19/2021- patient with no formal UE HEP; 12/02/2021= Patient verbalized knowledge of HEP including use of theraband for UE strengthening.  Goal status: GOAL MET  LONG TERM GOALS: Target date: 04/05/2022  Pt will be independent with final for UE/LE HEP in order to improve strength and balance in order to decrease fall risk and improve function at home and work. Baseline: Patient is currently BLE NWB and unable to participate in HEP. Has order for UE strengthening. 01/11/2022- Patient now able to participate in LE strengthening although NWB still- good understanding for some basic exercises. Will keep goal active to incorporate progressive LE strengthening exercises. Goal status: Progressing  2.  Pt will improve FOTO to target score of 35  to display perceived improvements in ability to complete ADL's.   Baseline: 10/19/2021= 12; 12/02/2021= 17; 01/11/2022=17 Goal status: Ongoing  3.  Pt will increase strength of B UE  by at least 1/2 MMT grade in order to demonstrate improvement in strength and function  Baseline: patient has range of 2-/5 to 4/5 BUE Strength; 12/02/2021= 4/5 except with wrist ext. 01/11/2022= 4/5 B UE strength expept for wrist Goal status: Partially met  ASSESSMENT:  CLINICAL IMPRESSION: Continuing PT POC with good motivation. Decreased standing times this session but could be due to author's strength compared to primary PT as pt is relying on maxA to stand. Pt demonstrates good core control to come up in tall sitting from chair with UE support and ability to assist in sliding anteriorly in chair with fair glut activation and UE use to assist in maintaining standing.  Pt without pain too throughout session with [place]. Pt will continue to benefit from skilled PT services to improve strength in anticipation for BLE clearance for New Site activities   OBJECTIVE IMPAIRMENTS Abnormal gait, decreased activity tolerance, decreased balance, decreased coordination, decreased endurance, decreased mobility, difficulty walking, decreased ROM, decreased strength, hypomobility, impaired flexibility, impaired UE functional use, postural dysfunction, and pain.   ACTIVITY LIMITATIONS carrying, lifting, bending, sitting, standing, squatting, sleeping, stairs, transfers, bed mobility, continence, bathing, toileting, dressing, self feeding, reach over head, hygiene/grooming, and caring for others  PARTICIPATION LIMITATIONS: meal prep, cleaning, laundry, medication management, personal finances, interpersonal relationship, driving, shopping, community activity, and yard work  PERSONAL FACTORS Age, Time since onset of injury/illness/exacerbation, and 1-2 comorbidities: HTN, cervical Sx  are also affecting patient's functional outcome.   REHAB POTENTIAL: Good  CLINICAL DECISION MAKING:  Evolving/moderate complexity  EVALUATION COMPLEXITY: Moderate  PLAN: PT FREQUENCY: 1-2x/week  PT DURATION: 12 weeks  PLANNED INTERVENTIONS: Therapeutic exercises, Therapeutic activity, Neuromuscular re-education, Balance training, Gait training, Patient/Family education, Self Care, Joint mobilization, DME instructions, Dry Needling, Electrical stimulation, Wheelchair mobility training, Spinal mobilization, Cryotherapy, Moist heat, and Manual therapy  PLAN FOR NEXT SESSION: Continue with weight bearing activities and LE strengthening as appropriate.    Salem Caster. Fairly IV, PT, DPT Physical Therapist- [facility]  02/22/2022, 2:23 PM

## 2022-02-22 NOTE — Therapy (Signed)
Occupational Therapy Treatment Note    Patient Name: Sherry [name] MRN: [id number] DOB:[birth year], 88 y.o., female Today's Date: 02/22/2022                                         PCP: Dr. Viviana Simpler REFERRING PROVIDER: Dr. Viviana Simpler   OT End of Session - 02/22/22 1403     Visit Number [id number]    Number of Visits 37    Date for OT Re-Evaluation 04/28/22    Authorization Time Period Progress report period starting 12/14/2021    OT Start Time 1100    OT Stop Time 1145    OT Time Calculation (min) 45 min    Activity Tolerance Patient tolerated treatment well    Behavior During Therapy [facility] for tasks assessed/performed                 Past Medical History:  Diagnosis Date   Acute blood loss anemia    Arthritis    Cancer ([place])    skin   Central cord syndrome at C6 level of cervical spinal cord ([place]) 11/29/2017   Hypertension    Protein-calorie malnutrition, severe ([place]) 01/24/2018   S/P insertion of IVC (inferior vena caval) filter 01/24/2018   Tetraparesis ([place])    Past Surgical History:  Procedure Laterality Date   ANTERIOR CERVICAL DECOMP/DISCECTOMY FUSION N/A 11/29/2017   Procedure: Cervical five-six, six-seven Anterior Cervical Decompression Fusion;  Surgeon: Judith Part, MD;  Location: [place];  Service: Neurosurgery;  Laterality: N/A;  Cervical five-six, six-seven Anterior Cervical Decompression Fusion   CATARACT EXTRACTION     FEMUR IM NAIL Left 08/16/2021   Procedure: INTRAMEDULLARY (IM) NAIL FEMORAL;  Surgeon: Earnestine Leys, MD;  Location: [facility];  Service: Orthopedics;  Laterality: Left;   IR IVC FILTER PLMT / S&I /IMG GUID/MOD SED  12/08/2017   Patient Active Problem List   Diagnosis Date Noted   Hypertensive heart disease with other congestive heart failure ([place]) 02/14/2022   Chronic indwelling Foley catheter 02/14/2022   Closed hip fracture ([place]) 08/15/2021   DVT (deep venous thrombosis) ([place]) 08/15/2021   Quadriplegia ([place]) 08/15/2021    Fall 08/15/2021   Constipation due to slow transit 08/31/2018   Trauma 06/05/2018   Neuropathic pain 06/05/2018   Neurogenic bowel 06/05/2018   Vaginal yeast infection 01/30/2018   Healthcare-associated pneumonia 01/25/2018   Chronic allergic rhinitis 01/24/2018   Depression with anxiety 01/24/2018   UTI due to Klebsiella species 01/24/2018   Protein-calorie malnutrition, severe ([place]) 01/24/2018   S/P insertion of IVC (inferior vena caval) filter 01/24/2018   Tetraparesis ([place]) 01/20/2018   Neurogenic bladder 01/20/2018   Reactive depression    Benign essential HTN    Acute postoperative anemia due to expected blood loss    Central cord syndrome at C6 level of cervical spinal cord ([place]) 11/29/2017   Allergy to alpha-gal 11/25/2016   SCC (squamous cell carcinoma) 04/08/2014    REFERRING DIAG: Central Cord Syndrome at C6 of the Cervical Spinal Cord, Fall with Bilateral Closed Hip Fractures, with ORIF repair with Intramedullary Nailing of the Left Hip Fracture.  THERAPY DIAG:  Muscle weakness (generalized)  Rationale for Evaluation and Treatment Rehabilitation  PERTINENT HISTORY: Patient s/p fall November 29, 2017 resulting in diagnosis of central cord syndrome at C 6 level.  She has had therapy in multiple venues but with recent move to [facility]  [place], therapy staff recommended she seek outpatient therapy for her needs. Pt. Had a recent fall with Bilateral Closed Hip Fractures, with ORIF repair with Intramedullary Nailing of the Left Hip Fracture.   PRECAUTIONS: None  SUBJECTIVE:   Pt. reports feeling better overall.   PAIN:  Are you having pain? No     OBJECTIVE:   TODAY'S TREATMENT:   Manual Therapy:   Pt. tolerated soft tissue massage to the volar, and dorsal surface of each digit on the bilateral hands secondary to stiffness. Manual therapy was performed independent of, and in preparation for therapeutic Ex.   Therapeutic Ex.    Pt. worked on [employer], and  reciprocal motion using the UBE while seated for 8 min. with minimal resistance with the height of the handles positioned higher for increased bilateral shoulder flexion. Constant monitoring was provided.  Pt. performed bilateral UE strengthening for bilateral shoulder flexion, chest presses, and elbow flexion, and extension, and inverted rowing with the 2# dowel. Pt. worked on bilateral UE strengthening. Pt. tolerated PROM followed by AROM bilateral wrist extension, PROM for bilateral digit MP, PIP, and DIP flexion, and extension, thumb radial, and palmar abduction.      Pt. continues to make progress with AROM in right shoulder flexion, and bilateral UE strength, and continues to try to engage her bilateral hands more during daily ADL, and IADL tasks. Pt. continues to tolerate There. Ex. on the UBE, and manual therapy well today. Pt. continues to present with tightness at the bilateral thumb webspace, and bilateral wrist flexor tightness, as well as digit MP, PIP, and DIP extensor tightness which continues to limit her ability to complete ADL tasks efficiently. Pt. continues to benefit from working on impoving ROM in order to work towards increasing bilateral hand grasp on objects, and increase engagement of her bilateral hands during ADLs, and IADL tasks.   PATIENT EDUCATION: Education details: BUE ROM, hand function, UE functioning Person educated: Patient Education method: Explanation and Demonstration Education comprehension: verbalized understanding, returned demonstration, and needs further education   HOME EXERCISE PROGRAM Continue with ongoing HEPs  Measurements:  12/09/2021:  Shoulder flexion: R: 120(136), L: 138(150) Shoulder abduction: R: 108(120), L: 110(120) Elbow: R: 0-148, L: 0-146 Wrist flexion: R: 55(62), L: 78 Writ extension: R: 34(55), L: 3(4) RD: R: 12(20), L: 16(26) UD: R: 8(24), L: 6(18) Thumb radial abduction: R: 11(20), L: 8(20) Digit flexion to the [facility]: R:  2nd:  5cm(3.5cm), 3rd: 7.5 cm(5cm), 4th: 7cm(3cm), 5th: 6cm(5.5cm) L: 2nd: 8cm(8cm), 3rd: 8cm(8cm), 4th: 7.5cm(7cm), 5th: 7cm(7cm)  02/03/2022:   Shoulder flexion: R: 122(138), L: 148(154) Shoulder abduction: R: 109(125), L: 125(125 Elbow: R: 0-148, L: 0-146 Wrist flexion: R: 60(68), L: 79 Writ extension: R: 40(55), L: 3(10) RD: R: 14(24), L: 20(28) UD: R: 8(24), L: 6(18) Thumb radial abduction: R: 20(25), L: 8(20) Digit flexion to the [facility]: R:  2nd: 4.5cm(3cm), 3rd: 6.5cm(4.5cm), 4th: 6.5 cm(3cm), 5th: 4.5cm(5cm) L: 2nd: 8cm(8cm), 3rd: 8cm(7cm), 4th: 7.5cm(7cm), 5th: 6.5cm(6.5cm)            OT Long Term Goals - 07/06/21 1106  Target Date: 04/28/2022    OT LONG TERM GOAL #1   Title Patient and caregiver will demonstrate understanding of home exercise program for ROM.    Baseline 12/09/2021: Pt. Reports the restorative ROM program has not resumed at the facility. 10/28/2021: Pt. Is attempting exercises/stretches at home. The facility restorative aides have not resumed exercises. 09/14/2021:  Pt. Reports inconsistencies with restorative ROM at  her care home. Pt/caregiver education to be provided about ongoing ROM needs.    Time 12    Period Weeks    Status Deferred     OT LONG TERM GOAL #2   Title Pt.  will improve BUE strength to be able to sustain her UE's in elevation to efficiently, and effectively perform hair care, and self-grooming tasks.   Baseline 02/03/2022: Pt. has improved, and is now able to sustain her Bilateral UEs in elevation long enough to efficiently, and effectively perform haircare, and self-grooming tasks. 12/09/2021: right shoulder 4-/5, left shoulder 4/5. Pt. has improved to be able to reach up for hair care, and self-grooming tasks. 10/28/2021: Right shoulder 3+/5, Left shoulder 4-/5 09/14/2021:  BUE shoulder strength: 3+/5 for shoulder flexion, and abduction.   Time 12    Period  Weeks    Status Achieved               OT LONG TERM GOAL #3   Title Patient will demonstrate improved composite finger flexion to hold deodorant to apply to underarms.    Baseline 02/03/2022: Pt. Has improved with digit flexion, however continues to have difficulty securely holding and using deoderant. 12/09/2021: Bilateral hand/digit MP, PIP, and DIP extension tightness limits her ability achieve digit flexion to hold, and apply deodorant. 10/28/2021:  Pt. Continues to have difficulty holding the deodorant. 01/12/2021: Pt. Presents with limited digit extension. Pt. Is able to initiate holding deodorant, however is unable to hold it while using it.   Time 12    Period Weeks    Status Ongoing   Target Date                  OT LONG TERM GOAL #4   Title Pt. Will improve bilateral wrist extension in preparation for anticipating, and initiating reaching for objects at the table.    Baseline 02/03/2022: Right: 34(55) Left: 3(4) 12/09/2021: Right  34(55)  Left  3(4) 10/28/2021:  Right 22(38), Left 0(15) 09/14/2021:  Right: 17(35), left 2(15)   Time 12   Period weeks   Status Ongoing   Target Date      OT LONG TERM GOAL #5   Title Pt. will write, and sign her name with 100% legibility, and modified independence.    Baseline 02/03/2022: Pt. Continues to report legibility appears "shaky" when attempting signing her name. Pt.reports that she is writing more when filling out her menu, and complete puzzles.12/09/2021: Pt. Reports legibility appears "shaky" when attempting signing her name. Pt.reports that she is writing more when filling out her menu. 10/28/2021: pt. is able to sign her name, however legibility continues to be inconsistent. Pt. Reports "shaky appearance". 09/14/2021: Pt. continues to fill out her daily menu and complete puzzles. Pt. Continues to present with limited writing legibility.   Time 12    Period Weeks    Status  Ongoing         OT LONG TERM GOAL  #6   TITLE Pt. will turn pages  in a book with modified independence.    Baseline 02/03/2022: Pt. Is able to  effectively turn pages of a book. 10/28/2021: Pt. Is able to consistently turn book pages. 09/14/2021: Pt. has difficulty turning pages.    Time 12    Period Weeks    Status Achieved       OT LONG TERM GOAL  #7  TITLE Pt. Will increase  bilateral lateral pinch strength by 2 lbs to be able to securely grasp items during ADLs, and IADL tasks.   Baseline 02/03/2022: Right: 6#, Left: 4# 12/09/2021:  Right: 6#, Left: 4#   Time 12  Period Weeks  Status Ongoing           OT LONG TERM GOAL  #8  TITLE Pt. Will button a sweater with large 1" buttons, and zip her jacket with modified independence.   Baseline 02/03/2022: Pt. is unable to manipulate buttons on her sweater, or zippers on her jacket.  Time 12  Period Weeks  Status  New   OT LONG TERM GOAL  #9  TITLE Pt. will complete plant care with modified independence.  Baseline 02/03/2022: Pt. has difficulty caring for her plants.   Time 12  Period Weeks  Status New   OT LONG TERM GOAL  #10  TITLE Pt. will demonstrate adaptive techniques to assist with the efficiency of self-dressing, or morning care tasks.  Baseline 02/03/2022: Pt. Requires assist from caregivers 2/2 time limitations during morning care.   Time 12  Period Weeks  Status New     Plan - 07/22/21 1737     Clinical Impression Statement Pt. continues to make progress with AROM in right shoulder flexion, and bilateral UE strength, and continues to try to engage her bilateral hands more during daily ADL, and IADL tasks. Pt. continues to tolerate There. Ex. on the UBE, and manual therapy well today. Pt. continues to present with tightness at the bilateral thumb webspace, and bilateral wrist flexor tightness, as well as digit MP, PIP, and DIP extensor tightness which continues to limit her ability to complete ADL tasks efficiently. Pt. continues to benefit from working on impoving ROM in order to work  towards increasing bilateral hand grasp on objects, and increase engagement of her bilateral hands during ADLs, and IADL tasks.                    OT Occupational Profile and History Detailed Assessment- Review of Records and additional review of physical, cognitive, psychosocial history related to current functional performance    Occupational performance deficits (Please refer to evaluation for details): ADL's;IADL's;Leisure;Social Participation    Body Structure / Function / Physical Skills ADL;Continence;Dexterity;Flexibility;Strength;ROM;Balance;Coordination;FMC;IADL;Endurance;UE functional use;Decreased knowledge of use of DME;[facility]    Psychosocial Skills Coping Strategies    Rehab Potential Fair    Clinical Decision Making Several treatment options, min-mod task modification necessary    Comorbidities Affecting Occupational Performance: Presence of comorbidities impacting occupational performance    Comorbidities impacting occupational performance description: contractures of bilateral hands, dependent transfers,    Modification or Assistance to Complete Evaluation  No modification of tasks or assist necessary to complete eval    OT Frequency 2x / week    OT Duration 12 weeks    OT Treatment/Interventions Self-care/ADL training;Cryotherapy;Therapeutic exercise;DME and/or AE instruction;Balance training;Neuromuscular education;Manual Therapy;Splinting;Moist Heat;Passive range of motion;Therapeutic activities;Patient/family education    Plan continue to progress ROM of digits, wrists and shoulders as it pertains to completion of ADL tasks that pt values.    Consulted and Agree with Plan of Care Patient  Harrel Carina, MS, OTR/L  Harrel Carina, OT 02/22/2022, 2:07 PM      `

## 2022-02-24 ENCOUNTER — Ambulatory Visit: Payer: Medicare HMO

## 2022-02-24 ENCOUNTER — Encounter: Payer: Self-pay | Admitting: Student

## 2022-02-24 ENCOUNTER — Ambulatory Visit: Payer: Medicare HMO | Admitting: Occupational Therapy

## 2022-02-24 ENCOUNTER — Non-Acute Institutional Stay (SKILLED_NURSING_FACILITY): Payer: Medicare HMO | Admitting: Student

## 2022-02-24 DIAGNOSIS — N319 Neuromuscular dysfunction of bladder, unspecified: Secondary | ICD-10-CM

## 2022-02-24 DIAGNOSIS — M6281 Muscle weakness (generalized): Secondary | ICD-10-CM

## 2022-02-24 DIAGNOSIS — R262 Difficulty in walking, not elsewhere classified: Secondary | ICD-10-CM

## 2022-02-24 DIAGNOSIS — R278 Other lack of coordination: Secondary | ICD-10-CM

## 2022-02-24 DIAGNOSIS — Z978 Presence of other specified devices: Secondary | ICD-10-CM | POA: Diagnosis not present

## 2022-02-24 DIAGNOSIS — R269 Unspecified abnormalities of gait and mobility: Secondary | ICD-10-CM

## 2022-02-24 DIAGNOSIS — S72001A Fracture of unspecified part of neck of right femur, initial encounter for closed fracture: Secondary | ICD-10-CM

## 2022-02-24 DIAGNOSIS — R2689 Other abnormalities of gait and mobility: Secondary | ICD-10-CM

## 2022-02-24 NOTE — Therapy (Signed)
Occupational Therapy Treatment Note    Patient Name: Sherry [name] MRN: [id number] DOB:[birth year], 88 y.o., female Today's Date: 02/24/2022                                         PCP: Dr. Viviana Simpler REFERRING PROVIDER: Dr. Viviana Simpler   OT End of Session - 02/24/22 1448     Visit Number 33    Number of Visits 67    Date for OT Re-Evaluation 04/28/22    Authorization Time Period Progress report period starting 12/14/2021    OT Start Time 66    OT Stop Time 1145    OT Time Calculation (min) 45 min    Equipment Utilized During Treatment moist heat    Activity Tolerance Patient tolerated treatment well    Behavior During Therapy WFL for tasks assessed/performed                 Past Medical History:  Diagnosis Date   Acute blood loss anemia    Arthritis    Cancer ([place])    skin   Central cord syndrome at C6 level of cervical spinal cord ([place]) 11/29/2017   Hypertension    Protein-calorie malnutrition, severe ([place]) 01/24/2018   S/P insertion of IVC (inferior vena caval) filter 01/24/2018   Tetraparesis ([place])    Past Surgical History:  Procedure Laterality Date   ANTERIOR CERVICAL DECOMP/DISCECTOMY FUSION N/A 11/29/2017   Procedure: Cervical five-six, six-seven Anterior Cervical Decompression Fusion;  Surgeon: Judith Part, MD;  Location: [place];  Service: Neurosurgery;  Laterality: N/A;  Cervical five-six, six-seven Anterior Cervical Decompression Fusion   CATARACT EXTRACTION     FEMUR IM NAIL Left 08/16/2021   Procedure: INTRAMEDULLARY (IM) NAIL FEMORAL;  Surgeon: Earnestine Leys, MD;  Location: [facility];  Service: Orthopedics;  Laterality: Left;   IR IVC FILTER PLMT / S&I /IMG GUID/MOD SED  12/08/2017   Patient Active Problem List   Diagnosis Date Noted   Hypertensive heart disease with other congestive heart failure ([place]) 02/14/2022   Chronic indwelling Foley catheter 02/14/2022   Closed hip fracture ([place]) 08/15/2021   DVT (deep venous thrombosis)  ([place]) 08/15/2021   Quadriplegia ([place]) 08/15/2021   Fall 08/15/2021   Constipation due to slow transit 08/31/2018   Trauma 06/05/2018   Neuropathic pain 06/05/2018   Neurogenic bowel 06/05/2018   Vaginal yeast infection 01/30/2018   Healthcare-associated pneumonia 01/25/2018   Chronic allergic rhinitis 01/24/2018   Depression with anxiety 01/24/2018   UTI due to Klebsiella species 01/24/2018   Protein-calorie malnutrition, severe ([place]) 01/24/2018   S/P insertion of IVC (inferior vena caval) filter 01/24/2018   Tetraparesis ([place]) 01/20/2018   Neurogenic bladder 01/20/2018   Reactive depression    Benign essential HTN    Acute postoperative anemia due to expected blood loss    Central cord syndrome at C6 level of cervical spinal cord ([place]) 11/29/2017   Allergy to alpha-gal 11/25/2016   SCC (squamous cell carcinoma) 04/08/2014    REFERRING DIAG: Central Cord Syndrome at C6 of the Cervical Spinal Cord, Fall with Bilateral Closed Hip Fractures, with ORIF repair with Intramedullary Nailing of the Left Hip Fracture.  THERAPY DIAG:  Muscle weakness (generalized)  Rationale for Evaluation and Treatment Rehabilitation  PERTINENT HISTORY: Patient s/p fall November 29, 2017 resulting in diagnosis of central cord syndrome at C 6 level.  She has had therapy  in multiple venues but with recent move to [facility], therapy staff recommended she seek outpatient therapy for her needs. Pt. Had a recent fall with Bilateral Closed Hip Fractures, with ORIF repair with Intramedullary Nailing of the Left Hip Fracture.   PRECAUTIONS: None  SUBJECTIVE:   Pt. reports feeling better overall.   PAIN:  Are you having pain? No     OBJECTIVE:   TODAY'S TREATMENT:   Manual Therapy:   Pt. tolerated soft tissue massage to the volar, and dorsal surface of each digit on the bilateral hands secondary to stiffness. Manual therapy was performed independent of, and in preparation for therapeutic Ex.    Therapeutic Ex.    Pt. worked on [employer], and reciprocal motion using the UBE while seated for 8 min. with minimal resistance with the height of the handles positioned higher for increased bilateral shoulder flexion. Constant monitoring was provided. Pt. worked on bilateral UE strengthening. Pt. tolerated PROM followed by AROM bilateral wrist extension, PROM for bilateral digit MP, PIP, and DIP flexion, and extension, thumb radial, and palmar abduction.      Pt. continues to make progress with AROM in right shoulder flexion, and bilateral UE strength, and continues to try to engage her bilateral hands more during daily ADL, and IADL tasks. Pt. continues to tolerate There. Ex. on the UBE, and manual therapy well today. Pt. continues to present with tightness at the bilateral thumb webspace, and bilateral wrist flexor tightness, as well as digit MP, PIP, and DIP extensor tightness which continues to limit her ability to complete ADL tasks efficiently. Pt. continues to benefit from working on impoving ROM in order to work towards increasing bilateral hand grasp on objects, and increase engagement of her bilateral hands during ADLs, and IADL tasks.   PATIENT EDUCATION: Education details: BUE ROM, hand function, UE functioning Person educated: Patient Education method: Explanation and Demonstration Education comprehension: verbalized understanding, returned demonstration, and needs further education   HOME EXERCISE PROGRAM Continue with ongoing HEPs  Measurements:  12/09/2021:  Shoulder flexion: R: 120(136), L: 138(150) Shoulder abduction: R: 108(120), L: 110(120) Elbow: R: 0-148, L: 0-146 Wrist flexion: R: 55(62), L: 78 Writ extension: R: 34(55), L: 3(4) RD: R: 12(20), L: 16(26) UD: R: 8(24), L: 6(18) Thumb radial abduction: R: 11(20), L: 8(20) Digit flexion to the [facility]: R:  2nd: 5cm(3.5cm), 3rd: 7.5 cm(5cm), 4th: 7cm(3cm), 5th: 6cm(5.5cm) L: 2nd: 8cm(8cm), 3rd: 8cm(8cm), 4th:  7.5cm(7cm), 5th: 7cm(7cm)  02/03/2022:   Shoulder flexion: R: 122(138), L: 148(154) Shoulder abduction: R: 109(125), L: 125(125 Elbow: R: 0-148, L: 0-146 Wrist flexion: R: 60(68), L: 79 Writ extension: R: 40(55), L: 3(10) RD: R: 14(24), L: 20(28) UD: R: 8(24), L: 6(18) Thumb radial abduction: R: 20(25), L: 8(20) Digit flexion to the [facility]: R:  2nd: 4.5cm(3cm), 3rd: 6.5cm(4.5cm), 4th: 6.5 cm(3cm), 5th: 4.5cm(5cm) L: 2nd: 8cm(8cm), 3rd: 8cm(7cm), 4th: 7.5cm(7cm), 5th: 6.5cm(6.5cm)            OT Long Term Goals - 07/06/21 1106  Target Date: 04/28/2022    OT LONG TERM GOAL #1   Title Patient and caregiver will demonstrate understanding of home exercise program for ROM.    Baseline 12/09/2021: Pt. Reports the restorative ROM program has not resumed at the facility. 10/28/2021: Pt. Is attempting exercises/stretches at home. The facility restorative aides have not resumed exercises. 09/14/2021:  Pt. Reports inconsistencies with restorative ROM at  her care home. Pt/caregiver education to be provided about ongoing ROM needs.    Time 12    Period Weeks    Status Deferred     OT LONG TERM GOAL #2   Title Pt.  will improve BUE strength to be able to sustain her UE's in elevation to efficiently, and effectively perform hair care, and self-grooming tasks.   Baseline 02/03/2022: Pt. has improved, and is now able to sustain her Bilateral UEs in elevation long enough to efficiently, and effectively perform haircare, and self-grooming tasks. 12/09/2021: right shoulder 4-/5, left shoulder 4/5. Pt. has improved to be able to reach up for hair care, and self-grooming tasks. 10/28/2021: Right shoulder 3+/5, Left shoulder 4-/5 09/14/2021:  BUE shoulder strength: 3+/5 for shoulder flexion, and abduction.   Time 12    Period Weeks    Status Achieved               OT LONG TERM GOAL #3   Title Patient will demonstrate  improved composite finger flexion to hold deodorant to apply to underarms.    Baseline 02/03/2022: Pt. Has improved with digit flexion, however continues to have difficulty securely holding and using deoderant. 12/09/2021: Bilateral hand/digit MP, PIP, and DIP extension tightness limits her ability achieve digit flexion to hold, and apply deodorant. 10/28/2021:  Pt. Continues to have difficulty holding the deodorant. 01/12/2021: Pt. Presents with limited digit extension. Pt. Is able to initiate holding deodorant, however is unable to hold it while using it.   Time 12    Period Weeks    Status Ongoing   Target Date                  OT LONG TERM GOAL #4   Title Pt. Will improve bilateral wrist extension in preparation for anticipating, and initiating reaching for objects at the table.    Baseline 02/03/2022: Right: 34(55) Left: 3(4) 12/09/2021: Right  34(55)  Left  3(4) 10/28/2021:  Right 22(38), Left 0(15) 09/14/2021:  Right: 17(35), left 2(15)   Time 12   Period weeks   Status Ongoing   Target Date      OT LONG TERM GOAL #5   Title Pt. will write, and sign her name with 100% legibility, and modified independence.    Baseline 02/03/2022: Pt. Continues to report legibility appears "shaky" when attempting signing her name. Pt.reports that she is writing more when filling out her menu, and complete puzzles.12/09/2021: Pt. Reports legibility appears "shaky" when attempting signing her name. Pt.reports that she is writing more when filling out her menu. 10/28/2021: pt. is able to sign her name, however legibility continues to be inconsistent. Pt. Reports "shaky appearance". 09/14/2021: Pt. continues to fill out her daily menu and complete puzzles. Pt. Continues to present with limited writing legibility.   Time 12    Period Weeks    Status  Ongoing         OT LONG TERM GOAL  #6   TITLE Pt. will turn pages in a book with modified independence.    Baseline 02/03/2022: Pt. Is able to effectively  turn  pages of a book. 10/28/2021: Pt. Is able to consistently turn book pages. 09/14/2021: Pt. has difficulty turning pages.    Time 12    Period Weeks    Status Achieved       OT LONG TERM GOAL  #7  TITLE Pt. Will increase  bilateral lateral pinch strength by 2 lbs to be able to securely grasp items during ADLs, and IADL tasks.   Baseline 02/03/2022: Right: 6#, Left: 4# 12/09/2021:  Right: 6#, Left: 4#   Time 12  Period Weeks  Status Ongoing           OT LONG TERM GOAL  #8  TITLE Pt. Will button a sweater with large 1" buttons, and zip her jacket with modified independence.   Baseline 02/03/2022: Pt. is unable to manipulate buttons on her sweater, or zippers on her jacket.  Time 12  Period Weeks  Status  New   OT LONG TERM GOAL  #9  TITLE Pt. will complete plant care with modified independence.  Baseline 02/03/2022: Pt. has difficulty caring for her plants.   Time 12  Period Weeks  Status New   OT LONG TERM GOAL  #10  TITLE Pt. will demonstrate adaptive techniques to assist with the efficiency of self-dressing, or morning care tasks.  Baseline 02/03/2022: Pt. Requires assist from caregivers 2/2 time limitations during morning care.   Time 12  Period Weeks  Status New     Plan - 07/22/21 1737     Clinical Impression Statement Pt. continues to make progress with AROM in right shoulder flexion, and bilateral UE strength, and continues to try to engage her bilateral hands more during daily ADL, and IADL tasks. Pt. continues to tolerate There. Ex. on the UBE, and manual therapy well today. Pt. continues to present with tightness at the bilateral thumb webspace, and bilateral wrist flexor tightness, as well as digit MP, PIP, and DIP extensor tightness which continues to limit her ability to complete ADL tasks efficiently. Pt. continues to benefit from working on impoving ROM in order to work towards increasing bilateral hand grasp on objects, and increase engagement of her bilateral hands  during ADLs, and IADL tasks.                     OT Occupational Profile and History Detailed Assessment- Review of Records and additional review of physical, cognitive, psychosocial history related to current functional performance    Occupational performance deficits (Please refer to evaluation for details): ADL's;IADL's;Leisure;Social Participation    Body Structure / Function / Physical Skills ADL;Continence;Dexterity;Flexibility;Strength;ROM;Balance;Coordination;FMC;IADL;Endurance;UE functional use;Decreased knowledge of use of DME;[facility]    Psychosocial Skills Coping Strategies    Rehab Potential Fair    Clinical Decision Making Several treatment options, min-mod task modification necessary    Comorbidities Affecting Occupational Performance: Presence of comorbidities impacting occupational performance    Comorbidities impacting occupational performance description: contractures of bilateral hands, dependent transfers,    Modification or Assistance to Complete Evaluation  No modification of tasks or assist necessary to complete eval    OT Frequency 2x / week    OT Duration 12 weeks    OT Treatment/Interventions Self-care/ADL training;Cryotherapy;Therapeutic exercise;DME and/or AE instruction;Balance training;Neuromuscular education;Manual Therapy;Splinting;Moist Heat;Passive range of motion;Therapeutic activities;Patient/family education    Plan continue to progress ROM of digits, wrists and shoulders as it pertains to completion of ADL tasks that pt values.    Consulted and Agree with Plan of Care Patient  Harrel Carina, MS, OTR/L  Harrel Carina, OT 02/24/2022, 2:52 PM      `

## 2022-02-24 NOTE — Progress Notes (Unsigned)
Location:  Other [place].  Nursing Home Room Number: [place] Place of Service:  SNF [id number]) Provider:  Dewayne Shorter, MD  Patient Care Team: Dewayne Shorter, MD as PCP - General [facility] Medicine)  Extended Emergency Contact Information Primary Emergency Contact: [name] Mobile Phone: [phone] Relation: Daughter Secondary Emergency Contact: Lindi Adie Address: [address] [place]          [place],  [address] [place] of [place] Phone: [phone] Relation: Spouse  Code Status:  Full Code (documented in [facility]) Goals of care: Advanced Directive information    02/24/2022   11:04 AM  Advanced Directives  Does Patient Have a Medical Advance Directive? Yes  Type of Advance Directive [place]  Does patient want to make changes to medical advance directive? No - Patient declined  Copy of [place] in Chart? Yes - validated most recent copy scanned in chart (See row information)     Chief Complaint  Patient presents with   Acute Visit    Abdominal Pain.     HPI:  Pt is a 88 y.o. female seen today for an acute visit for patient having lower abdominal pain and increased labial irritation. Patient has had catheter changed 3x this week. Discussed concern for potential infection. Discussed previous follow up urology that was on hold due to [place] 19 pandemic. She had the fall and fracture ~8 mo ago which led to regression in therapy. She stands now but only for short periods.    Past Medical History:  Diagnosis Date   Acute blood loss anemia    Arthritis    Cancer ([place])    skin   Central cord syndrome at C6 level of cervical spinal cord ([place]) 11/29/2017   Hypertension    Protein-calorie malnutrition, severe ([place]) 01/24/2018   S/P insertion of IVC (inferior vena caval) filter 01/24/2018   Tetraparesis ([place])    Past Surgical History:  Procedure Laterality Date   ANTERIOR CERVICAL DECOMP/DISCECTOMY  FUSION N/A 11/29/2017   Procedure: Cervical five-six, six-seven Anterior Cervical Decompression Fusion;  Surgeon: Judith Part, MD;  Location: [place];  Service: Neurosurgery;  Laterality: N/A;  Cervical five-six, six-seven Anterior Cervical Decompression Fusion   CATARACT EXTRACTION     FEMUR IM NAIL Left 08/16/2021   Procedure: INTRAMEDULLARY (IM) NAIL FEMORAL;  Surgeon: Earnestine Leys, MD;  Location: [facility];  Service: Orthopedics;  Laterality: Left;   IR IVC FILTER PLMT / S&I /IMG GUID/MOD SED  12/08/2017    Allergies  Allergen Reactions   Other Swelling   Lisinopril-Hydrochlorothiazide Swelling    Swelling of the tongue   Beef-Derived Products Swelling    Facial and lip swelling (due to tick bite)   Dairy Aid [Tilactase] Swelling   Lisinopril-Hydrochlorothiazide Swelling    Tongue swelling   Pork-Derived Products Swelling    Outpatient Encounter Medications as of 02/24/2022  Medication Sig   acetaminophen (TYLENOL) 500 MG tablet Take 500 mg by mouth every 12 (twelve) hours as needed for mild pain.   acetaminophen (TYLENOL) 650 MG CR tablet Take 650 mg by mouth every 8 (eight) hours as needed for pain.   acetic acid 0.25 % irrigation Use 69m via irrigation two times daily   alendronate (FOSAMAX) 70 MG tablet Take 70 mg by mouth every Saturday.   alum & mag hydroxide-simeth (MAALOX/MYLANTA) 200-200-20 MG/5ML suspension Take 30 mLs by mouth every 4 (four) hours as needed for indigestion.   Calcium Carbonate Antacid (TUMS PO) Take  one tablet by mouth twice daily   Capsaicin 0.1 % CREA Apply 1 application  topically every evening. (Apply to hands)   carboxymethylcellulose (REFRESH PLUS) 0.5 % SOLN Place 2 drops into both eyes every 6 (six) hours as needed (dry eyes).   cetirizine (ZYRTEC) 5 MG tablet Take 5 mg by mouth daily.   cholecalciferol (VITAMIN D3) 25 MCG (1000 UNIT) tablet Take 1,000 Units by mouth daily.   DULoxetine (CYMBALTA) 60 MG capsule Take 60 mg by mouth daily.    EPINEPHrine 0.3 mg/0.3 mL IJ SOAJ injection Inject 0.3 mg into the muscle as needed for anaphylaxis.   ferrous sulfate 325 (65 FE) MG tablet Take 325 mg by mouth 2 (two) times daily with a meal.   gabapentin (NEURONTIN) 100 MG capsule Take 1 capsule (100 mg total) by mouth daily.   guaiFENesin (MUCINEX) 600 MG 12 hr tablet Take 600 mg by mouth 2 (two) times daily as needed for to loosen phlegm.   Infant Care Products [facility]) OINT Apply to buttocks/gluteal folds topically every shift.   lactulose, encephalopathy, (CHRONULAC) 10 GM/15ML SOLN Take 30 mLs by mouth daily as needed (mild constipation).   magnesium citrate solution Take 60-120 mLs by mouth daily as needed (constipation).   methylcellulose oral powder Take 1 packet by mouth at bedtime.   mometasone (ELOCON) 0.1 % lotion Apply 1 Application topically every Saturday at 6 PM. (Apply to the ear canal for eczema)   Multiple Vitamin (MULTIVITAMIN WITH MINERALS) TABS tablet Take 1 tablet by mouth daily.   polyethylene glycol (MIRALAX / GLYCOLAX) 17 g packet Take 17 g by mouth 2 (two) times daily.   rivaroxaban (XARELTO) 20 MG TABS tablet Take 1 tablet (20 mg total) by mouth daily with supper.   saccharomyces boulardii (FLORASTOR) 250 MG capsule Take 250 mg by mouth daily.   Saline Spray 0.2 % SOLN Place 2 sprays into both nostrils as needed (nasal congestion).   simethicone (MYLICON) 80 MG chewable tablet Chew 80 mg by mouth every 6 (six) hours as needed for flatulence.   sodium phosphate (FLEET) 7-19 GM/118ML ENEM Place 1 enema rectally every other day as needed for severe constipation.   spironolactone (ALDACTONE) 25 MG tablet Take 1 tablet (25 mg total) by mouth daily.   tiZANidine (ZANAFLEX) 2 MG tablet Take 1 tablet (2 mg total) by mouth 2 (two) times daily.   triamcinolone cream (KENALOG) 0.1 % Apply to thigh and lower legs topically every 8 hours as needed. Apply to back of neck topically as needed.   [DISCONTINUED] triamcinolone  cream (KENALOG) 0.1 % Apply 1 Application topically 2 (two) times daily.   No facility-administered encounter medications on file as of 02/24/2022.    Review of Systems  All other systems reviewed and are negative.   Immunization History  Administered Date(s) Administered   Influenza, High Dose Seasonal PF 11/17/2021   Influenza-Unspecified 11/14/2018, 11/20/2019, 11/17/2020   Moderna Covid-19 Vaccine Bivalent Booster 75yr & up 06/30/2021, 12/11/2021   Moderna Sars-Covid-2 Vaccination 02/09/2019, 03/09/2019, 12/14/2019, 06/20/2020, 10/24/2020   PPD Test 05/16/2019   Tdap 11/29/2017   Pertinent  Health Maintenance Due  Topic Date Due   INFLUENZA VACCINE  Completed   DEXA SCAN  Discontinued      08/18/2021   12:00 PM 08/18/2021    8:00 PM 08/19/2021   10:00 AM 08/19/2021    8:54 PM 08/20/2021    8:20 AM  Fall Risk  (RETIRED) Patient Fall Risk Level High fall risk High fall  risk High fall risk High fall risk High fall risk   Functional Status Survey:    Vitals:   02/24/22 1048  BP: (!) 153/91  Pulse: 76  Resp: 18  Temp: 98 F (36.7 C)  SpO2: 96%  Weight: 168 lb 6.4 oz (76.4 kg)  Height: '5\' 7"'$  (1.702 m)   Body mass index is 26.38 kg/m. Physical Exam Vitals reviewed.  Abdominal:     Comments: Soft, suprapubic tenderness.   Skin:    General: Skin is warm and dry.  Neurological:     Mental Status: She is oriented to person, place, and time.     Labs reviewed: Recent Labs    08/18/21 0423 08/19/21 0333 08/20/21 0443 09/24/21 0000 02/08/22 0000  NA 124* 130* 130* 137 137  K 4.6 5.1 4.5 4.7 4.8  CL 95* 102 101 101 98*  CO2 '23 23 26 '$ 30* 33*  GLUCOSE 117* 117* 101*  --   --   BUN 29* '23 15 11 10  '$ CREATININE 0.96 0.72 0.54 0.5 0.5  CALCIUM 7.1* 7.4* 7.1* 8.5* 9.0   Recent Labs    09/24/21 0000 02/08/22 0000  AST  --  18  ALT  --  14  ALKPHOS  --  65  ALBUMIN 3.2* 3.7   Recent Labs    08/15/21 1035 08/16/21 1333 08/18/21 0423 08/18/21 1759  08/19/21 0333 08/20/21 0443 08/20/21 1029 11/26/21 0000 02/08/22 0000  WBC 7.4   < > 13.0*  --  10.6* 8.9  --  6.3 5.5  NEUTROABS 5.2  --   --   --   --   --   --   --  3,036.00  HGB 12.9   < > 7.7*   < > 8.9* 7.8* 8.6* 10.5* 11.7*  HCT 41.3   < > 22.7*   < > 27.5* 23.7*  --  32* 35*  MCV 89.8   < > 87.6  --  88.4 87.1  --   --   --   PLT 335   < > 180  --  198 216  --  346 321   < > = values in this interval not displayed.   No results found for: "TSH" No results found for: "HGBA1C" No results found for: "CHOL", "HDL", "LDLCALC", "LDLDIRECT", "TRIG", "CHOLHDL"  Significant Diagnostic Results in last 30 days:  No results found.  Assessment/Plan Neurogenic bladder  Chronic indwelling Foley catheter Patient has had increased sediment, tenderness, and irritation. VSS. Some concern for CAUTI. Per patient preference deferred GU exam today. Discussed concern for frequent changes. Will plan to collect UA CX and if positive treat and reengage urology for her care.    Family/ staff Communication: nursing  Labs/tests ordered:  UA UCx

## 2022-02-24 NOTE — Therapy (Signed)
OUTPATIENT PHYSICAL THERAPY NEURO TREATMENT   Patient Name: Sherry [name] MRN: [id number] DOB:1934-05-27, 88 y.o., female Today's Date: 02/24/2022   PCP: Sherry Shorter, MD REFERRING PROVIDER: Earnestine [name]   PT End of Session - 02/24/22 1147     Visit Number 27    Number of Visits 43    Date for PT Re-Evaluation 04/05/22    Authorization Time Period Initial cert 8/34/1962-22/[id number]/9892; Recert 11/94/1740-09/02/4479    Progress Note Due on Visit 30    PT Start Time 1146    PT Stop Time 1220    PT Time Calculation (min) 34 min    Equipment Utilized During Treatment Gait belt    Activity Tolerance Patient tolerated treatment well    Behavior During Therapy WFL for tasks assessed/performed                         Past Medical History:  Diagnosis Date   Acute blood loss anemia    Arthritis    Cancer ([place])    skin   Central cord syndrome at C6 level of cervical spinal cord ([place]) 11/29/2017   Hypertension    Protein-calorie malnutrition, severe ([place]) 01/24/2018   S/P insertion of IVC (inferior vena caval) filter 01/24/2018   Tetraparesis ([place])    Past Surgical History:  Procedure Laterality Date   ANTERIOR CERVICAL DECOMP/DISCECTOMY FUSION N/A 11/29/2017   Procedure: Cervical five-six, six-seven Anterior Cervical Decompression Fusion;  Surgeon: Sherry Part, MD;  Location: [facility];  Service: Neurosurgery;  Laterality: N/A;  Cervical five-six, six-seven Anterior Cervical Decompression Fusion   CATARACT EXTRACTION     FEMUR IM NAIL Left 08/16/2021   Procedure: INTRAMEDULLARY (IM) NAIL FEMORAL;  Surgeon: Sherry Leys, MD;  Location: [facility];  Service: Orthopedics;  Laterality: Left;   IR IVC FILTER PLMT / S&I /IMG GUID/MOD SED  12/08/2017   Patient Active Problem List   Diagnosis Date Noted   Hypertensive heart disease with other congestive heart failure ([place]) 02/14/2022   Chronic indwelling Foley catheter 02/14/2022   Closed hip fracture ([place])  08/15/2021   DVT (deep venous thrombosis) ([place]) 08/15/2021   Quadriplegia ([employer]) 08/15/2021   Fall 08/15/2021   Constipation due to slow transit 08/31/2018   Trauma 06/05/2018   Neuropathic pain 06/05/2018   Neurogenic bowel 06/05/2018   Vaginal yeast infection 01/30/2018   Healthcare-associated pneumonia 01/25/2018   Chronic allergic rhinitis 01/24/2018   Depression with anxiety 01/24/2018   UTI due to Klebsiella species 01/24/2018   Protein-calorie malnutrition, severe ([place]) 01/24/2018   S/P insertion of IVC (inferior vena caval) filter 01/24/2018   Tetraparesis ([place]) 01/20/2018   Neurogenic bladder 01/20/2018   Reactive depression    Benign essential HTN    Acute postoperative anemia due to expected blood loss    Central cord syndrome at C6 level of cervical spinal cord ([place]) 11/29/2017   Allergy to alpha-gal 11/25/2016   SCC (squamous cell carcinoma) 04/08/2014    ONSET DATE: 08/15/2021 (fall with B hip fx); Initial injury was in 2019- quadraplegia due to central cord syndrom of C6 secondary to neck fracture.   REFERRING DIAG: Bilateral Hip fx; Left Open- s/p Left IM nail femoral on 08/16/2021; Right closed  THERAPY DIAG:  Muscle weakness (generalized)  Other lack of coordination  Abnormality of gait and mobility  Difficulty in walking, not elsewhere classified  Other abnormalities of gait and mobility  Closed fracture of both hips, initial encounter ([place])  Rationale for Evaluation and Treatment  Rehabilitation  SUBJECTIVE:                                                                                                                                                                                              SUBJECTIVE STATEMENT: Pt reports no concerns or issues since last visit. No falls.       Pt accompanied by:  Paid caregiver-   PERTINENT HISTORY: Patient was previous patient (see previous episode for specifics) She experienced a fall at home on 08/15/2021 and  was in [facility] from 08/15/2021- 08/20/2021.  Most recent history per Dr. Laurey [name] on 08/20/2021.  Sherry [name] is a 88 y.o. 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   Patient has history of quadriplegia, and has been doing PT/OT with improvement of function. Pt has some use of both arms and can feed herself. She can walk a little bit with assistance. Pt accidentally fell at about 930 this morning after taking shower.  She injured her hips. Hospital Course:    Closed left hip fracture [facility]) s/p mechanical fall Closed right hip fracture (?old) Acute blood loss anemia --pt is s/p  INTRAMEDULLARY (IM) NAIL FEMORAL-left on 7/16  PAIN:  Are you having pain? NO: NPRS scale: 0/10 Pain location: Right hip Pain description: Ache Aggravating factors: any active Movement of right side Relieving factors: Rest  PRECAUTIONS: Fall  WEIGHT BEARING RESTRICTIONS Yes Bilateral non-weight bearing B LE's  FALLS: Has patient fallen in last 6 months? Yes. Number of falls 1  LIVING ENVIRONMENT: Lives with: lives with their family and lives in a nursing home Lives in: Other Long term care at [place]: No Has following equipment at home: Wheelchair (power), Ramped entry, and hoyer lift  PLOF: Needs assistance with ADLs, Needs assistance with homemaking, Needs assistance with gait, Needs assistance with transfers, and Leisure: Work puzzles, read, Watch TV  PATIENT GOALS Patient goal is to walk; Be able to strengthen my arms for better use  OBJECTIVE:   DIAGNOSTIC FINDINGS: Overall cognitive statusEXAM: DG HIP (WITH OR WITHOUT PELVIS) 2-3V LEFT; DG HIP (WITH OR WITHOUT PELVIS) 2-3V RIGHT   COMPARISON:  CT of the abdomen and pelvis 01/21/2020   FINDINGS: Left  proximal femur fracture noted just below the intertrochanteric region. Marked ventral angulation noted on the cross-table image. Slight varus angulation present   Comminuted intratrochanteric fracture present on the right also with marked ventral angulation.  IMPRESSION: 1. Bilateral hip fractures. 2. Comminuted intertrochanteric fracture of the right hip. 3. Angulated fracture of the proximal left femur just below the intertrochanteric region.     Electronically Signed   By: San Morelle M.D.   On: 08/15/2021 11:54:   COGNITION:  Within functional limits for tasks assessed   SENSATION: Light touch: Impaired   EDEMA:  None  Strength R/L 2-/2- Shoulder flexion (anterior deltoid/pec major/coracobrachialis, axillary n. (C5-6) and musculocutaneous n. (C5-7)) 2-/3+ (within available ROM around 130 on lef)Shoulder abduction (deltoid/supraspinatus, axillary/suprascapular n, C5) 3+/3+ Shoulder external rotation (infraspinatus/teres minor) 4/4 Shoulder internal rotation (subcapularis/lats/pec major) 3+/3+ Shoulder extension (posterior deltoid, lats, teres major, axillary/thoracodorsal n.) 3+/3+ Shoulder horizontal abduction 4/4 Elbow flexion (biceps brachii, brachialis, brachioradialis, musculoskeletal n, C5-6) 4/4 Elbow extension (triceps, radial n, C7) 2-/2- Wrist Extension 3+/3+ Wrist Flexion   LOWER EXTREMITY MMT:    MMT Right 01/11/22 Left 01/11/22  Hip flexion 2- 2-  Hip extension 2- 2-  Hip abduction 2- 2-  Hip adduction 2- 3  Hip internal rotation 2- 2-  Hip external rotation 2- 2-  Knee flexion 2- 2-  Knee extension 2+ 3+  Ankle dorsiflexion 2- NT - boot  Ankle plantarflexion 2- NT- boot  Ankle inversion 2- NT- boot  Ankle eversion 2- NT- boot  (Blank rows = not tested)  BED MOBILITY:  Unable to test due to NWB status of BLE- Patient currently dependent with all bed mobility using hoyer and caregiver assist  TRANSFERS: Currently BLE NWB-  Dependent on use of hoyer for all transfers  FUNCTIONAL TESTs:   PATIENT SURVEYS:  FOTO 12  TODAY'S TREATMENT:   -COPIED FROM [facility]-DR. H. MILLER note from 12/30/2021 12/30/2021 12/30/2021 Assessment and Plan:  Impression: 1. Bilateral hip fractures.  Recommendation: The patient will continue the current program until 02/01/2022 when she may try some weightbearing exercises as well. The patient will follow up in 03/2022 for exam and x-ray of both hips.  ATTESTATION:  This note has been generated by Romero Liner and edited by Christiana Fuchs, Quality Documentation Specialist.     - COPIED FROM [facility]- DR. Jonny Ruiz Note from 11/18/2021:  11/18/2021 11/18/2021 Assessment and Plan:  Bilateral hip fractures.   Recommendation:  The patient's PT may start with range of motion and more activity with the hips. I would like to avoid weightbearing still. We will see her back here in 6 weeks for an exam and x-ray of both hips.     ** Per above instructions - now able to begin some weight bearing activities.  THERE.EX:    STS in // bars: 1 trial maxA+1. Limited forward weight shift leading to posterior bias and sliding of LE's on floor anteriorly. 2 more trials performed with assist from care giver. ModA+2 with ability to reach upright posture and maintain 40 seconds for 1 rep then 20 seconds the second rep. Pt able to independently operate power chair to help scoot anteriorly to edge of seat. minA at LE's to assist in shifting.    In standing pt able to use Ue's on // bars to support and assist once out of seat in glut activation to stand with VC's.     Seated abdominal crunch: x15 forward with SBA.     Seated abdominal crunch with R/L trunk rotations for UE mobility/strength and engagement of obliques. X15/side. SBA.     PATIENT EDUCATION: Education details: PT plan of care/use of resistive bands Person educated: Patient Education method: Explanation, Demonstration,  Tactile cues, and Verbal cues Education comprehension: verbalized understanding, returned demonstration, verbal cues required, tactile cues required, and needs further education   HOME EXERCISE PROGRAM: Discussed use of theraband and demo shoulder flex/abd and elbow flex and ext with use of yellow TB. Patient required assist to position therband but able to perform well with only one person assist  Access Code: [facility] URL: [email or web address] Date: 11/23/2021 Prepared by: Janna Arch Exercises - Seated Gluteal Sets - 2 x daily - 7 x weekly - 2 sets - 10 reps - 5 hold - Seated Quad Set - 2 x daily - 7 x weekly - 2 sets - 10 reps - 5 hold - Seated Long Arc Quad - 2 x daily - 7 x weekly - 2 sets - 10 reps - 5 hold - Seated March - 2 x daily - 7 x weekly - 2 sets - 10 reps - 5 hold - Seated Hip Abduction - 2 x daily - 7 x weekly - 2 sets - 10 reps - 5 hold - Seated Shoulder Shrugs - 2 x daily - 7 x weekly - 2 sets - 10 reps - 5 hold - Wheelchair Pressure Relief - 2 x daily - 7 x weekly - 2 sets - 10 reps - 5 hold  GOALS: Goals reviewed with patient? Yes  SHORT TERM GOALS: Target date: 02/22/2022  Pt will be independent with initial UE strengthening HEP in order to improve strength and balance in order to decrease fall risk and improve function at home and work. Baseline: 10/19/2021- patient with no formal UE HEP; 12/02/2021= Patient verbalized knowledge of HEP including use of theraband for UE strengthening.  Goal status: GOAL MET  LONG TERM GOALS: Target date: 04/05/2022  Pt will be independent with final for UE/LE HEP in order to improve strength and balance in order to decrease fall risk and improve function at home and work. Baseline: Patient is currently BLE NWB and unable to participate in HEP. Has order for UE strengthening. 01/11/2022- Patient now able to participate in LE strengthening although NWB still- good understanding for some basic exercises. Will keep goal active to  incorporate progressive LE strengthening exercises. Goal status: Progressing  2.  Pt will improve FOTO to target score of 35  to display perceived improvements in ability to complete ADL's.  Baseline: 10/19/2021= 12; 12/02/2021= 17; 01/11/2022=17 Goal status: Ongoing  3.  Pt will increase strength of B UE  by at least 1/2 MMT grade in order to demonstrate improvement in strength and function  Baseline: patient has range of 2-/5 to 4/5 BUE Strength; 12/02/2021= 4/5 except with wrist ext. 01/11/2022= 4/5 B UE strength expept for wrist Goal status: Partially met  ASSESSMENT:  CLINICAL IMPRESSION: Continuing PT POC. With assist from caregiver pt demonstrating improved standing tolerance as she is more successful in attaining upright. Pt may benefit in future sessions using Sabina STS lift for pt/therapist/caregiver safety as this may assist in further upright tolerance and safe ability for increased time in standing. Pt continues to demonstrate good core activation with ability to slide anteriorly in seat and also sit upright unsupported temporarily without UE support. Pt without pain throughout session with [place]. Pt will continue to benefit from skilled PT services to improve strength in anticipation for BLE clearance for [place] activities.    OBJECTIVE IMPAIRMENTS Abnormal gait, decreased activity tolerance, decreased balance, decreased coordination, decreased endurance, decreased mobility, difficulty walking, decreased ROM, decreased strength, hypomobility, impaired flexibility, impaired UE functional use, postural dysfunction,  and pain.   ACTIVITY LIMITATIONS carrying, lifting, bending, sitting, standing, squatting, sleeping, stairs, transfers, bed mobility, continence, bathing, toileting, dressing, self feeding, reach over head, hygiene/grooming, and caring for others  PARTICIPATION LIMITATIONS: meal prep, cleaning, laundry, medication management, personal finances, interpersonal relationship,  driving, shopping, community activity, and yard work  PERSONAL FACTORS Age, Time since onset of injury/illness/exacerbation, and 1-2 comorbidities: HTN, cervical Sx  are also affecting patient's functional outcome.   REHAB POTENTIAL: Good  CLINICAL DECISION MAKING: Evolving/moderate complexity  EVALUATION COMPLEXITY: Moderate  PLAN: PT FREQUENCY: 1-2x/week  PT DURATION: 12 weeks  PLANNED INTERVENTIONS: Therapeutic exercises, Therapeutic activity, Neuromuscular re-education, Balance training, Gait training, Patient/Family education, Self Care, Joint mobilization, DME instructions, Dry Needling, Electrical stimulation, Wheelchair mobility training, Spinal mobilization, Cryotherapy, Moist heat, and Manual therapy  PLAN FOR NEXT SESSION: Continue with weight bearing activities and LE strengthening as appropriate. Sabina lift?   Salem Caster. Fairly IV, PT, DPT Physical Therapist- [facility]  02/24/2022, 12:28 PM

## 2022-02-25 LAB — CBC: RBC: 3.39 — AB (ref 3.87–5.11)

## 2022-02-25 LAB — CBC AND DIFFERENTIAL
HCT: 32 — AB (ref 36–46)
Hemoglobin: 10.4 — AB (ref 12.0–16.0)
Neutrophils Absolute: 2800
Platelets: 318 10*3/uL (ref 150–400)
WBC: 5.1

## 2022-03-01 ENCOUNTER — Ambulatory Visit: Payer: Medicare HMO

## 2022-03-01 ENCOUNTER — Ambulatory Visit: Payer: Medicare HMO | Admitting: Occupational Therapy

## 2022-03-03 ENCOUNTER — Encounter: Payer: Self-pay | Admitting: Student

## 2022-03-03 ENCOUNTER — Ambulatory Visit: Payer: Medicare HMO

## 2022-03-03 ENCOUNTER — Ambulatory Visit: Payer: Medicare HMO | Admitting: Occupational Therapy

## 2022-03-03 DIAGNOSIS — S72001A Fracture of unspecified part of neck of right femur, initial encounter for closed fracture: Secondary | ICD-10-CM

## 2022-03-03 DIAGNOSIS — R2689 Other abnormalities of gait and mobility: Secondary | ICD-10-CM

## 2022-03-03 DIAGNOSIS — R278 Other lack of coordination: Secondary | ICD-10-CM

## 2022-03-03 DIAGNOSIS — M6281 Muscle weakness (generalized): Secondary | ICD-10-CM

## 2022-03-03 DIAGNOSIS — R2681 Unsteadiness on feet: Secondary | ICD-10-CM

## 2022-03-03 DIAGNOSIS — R269 Unspecified abnormalities of gait and mobility: Secondary | ICD-10-CM

## 2022-03-03 DIAGNOSIS — R262 Difficulty in walking, not elsewhere classified: Secondary | ICD-10-CM

## 2022-03-03 DIAGNOSIS — S14129S Central cord syndrome at unspecified level of cervical spinal cord, sequela: Secondary | ICD-10-CM

## 2022-03-03 NOTE — Therapy (Signed)
OUTPATIENT PHYSICAL THERAPY NEURO TREATMENT   Patient Name: Sherry [name] MRN: [id number] DOB:[birth year], 88 y.o., female Today's Date: 03/03/2022   PCP: Dewayne Shorter, MD REFERRING PROVIDER: Earnestine Leys   PT End of Session - 03/03/22 1055     Visit Number 28    Number of Visits 43    Date for PT Re-Evaluation 04/05/22    Authorization Time Period Initial cert 4/43/1540-08/[id number]/6195; Recert 09/32/6712-05/06/8097    Progress Note Due on Visit 30    PT Start Time 1145    PT Stop Time 1220    PT Time Calculation (min) 35 min    Equipment Utilized During Treatment Gait belt    Activity Tolerance Patient tolerated treatment well    Behavior During Therapy WFL for tasks assessed/performed                          Past Medical History:  Diagnosis Date   Acute blood loss anemia    Arthritis    Cancer ([place])    skin   Central cord syndrome at C6 level of cervical spinal cord ([place]) 11/29/2017   Hypertension    Protein-calorie malnutrition, severe ([place]) 01/24/2018   S/P insertion of IVC (inferior vena caval) filter 01/24/2018   Tetraparesis ([place])    Past Surgical History:  Procedure Laterality Date   ANTERIOR CERVICAL DECOMP/DISCECTOMY FUSION N/A 11/29/2017   Procedure: Cervical five-six, six-seven Anterior Cervical Decompression Fusion;  Surgeon: Judith Part, MD;  Location: [facility];  Service: Neurosurgery;  Laterality: N/A;  Cervical five-six, six-seven Anterior Cervical Decompression Fusion   CATARACT EXTRACTION     FEMUR IM NAIL Left 08/16/2021   Procedure: INTRAMEDULLARY (IM) NAIL FEMORAL;  Surgeon: Earnestine Leys, MD;  Location: [facility];  Service: Orthopedics;  Laterality: Left;   IR IVC FILTER PLMT / S&I /IMG GUID/MOD SED  12/08/2017   Patient Active Problem List   Diagnosis Date Noted   Hypertensive heart disease with other congestive heart failure ([place]) 02/14/2022   Chronic indwelling Foley catheter 02/14/2022   Closed hip fracture ([place])  08/15/2021   DVT (deep venous thrombosis) ([place]) 08/15/2021   Quadriplegia ([place]) 08/15/2021   Fall 08/15/2021   Constipation due to slow transit 08/31/2018   Trauma 06/05/2018   Neuropathic pain 06/05/2018   Neurogenic bowel 06/05/2018   Vaginal yeast infection 01/30/2018   Healthcare-associated pneumonia 01/25/2018   Chronic allergic rhinitis 01/24/2018   Depression with anxiety 01/24/2018   UTI due to Klebsiella species 01/24/2018   Protein-calorie malnutrition, severe ([place]) 01/24/2018   S/P insertion of IVC (inferior vena caval) filter 01/24/2018   Tetraparesis ([place]) 01/20/2018   Neurogenic bladder 01/20/2018   Reactive depression    Benign essential HTN    Acute postoperative anemia due to expected blood loss    Central cord syndrome at C6 level of cervical spinal cord ([place]) 11/29/2017   Allergy to alpha-gal 11/25/2016   SCC (squamous cell carcinoma) 04/08/2014    ONSET DATE: 08/15/2021 (fall with B hip fx); Initial injury was in 2019- quadraplegia due to central cord syndrom of C6 secondary to neck fracture.   REFERRING DIAG: Bilateral Hip fx; Left Open- s/p Left IM nail femoral on 08/16/2021; Right closed  THERAPY DIAG:  Muscle weakness (generalized)  Other lack of coordination  Abnormality of gait and mobility  Difficulty in walking, not elsewhere classified  Other abnormalities of gait and mobility  Closed fracture of both hips, initial encounter (HCC)  Unsteadiness on feet  Central cord syndrome, sequela ([place])  Rationale for Evaluation and Treatment Rehabilitation  SUBJECTIVE:                                                                                                                                                                                              SUBJECTIVE STATEMENT:    Patient reports her power wheelchair has to be repaired- joystick issues and she presents in manual W/c today.      Pt accompanied by:  Paid caregiver-   PERTINENT  HISTORY: Patient was previous patient (see previous episode for specifics) She experienced a fall at home on 08/15/2021 and was in [facility] from 08/15/2021- 08/20/2021.  Most recent history per Dr. Laurey Arrow on 08/20/2021.  Sherry [name] is a 88 y.o. 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   Patient has history of quadriplegia, and has been doing PT/OT with improvement of function. Pt has some use of both arms and can feed herself. She can walk a little bit with assistance. Pt accidentally fell at about 930 this morning after taking shower.  She injured her hips. Hospital Course:    Closed left hip fracture [facility]) s/p mechanical fall Closed right hip fracture (?old) Acute blood loss anemia --pt is s/p  INTRAMEDULLARY (IM) NAIL FEMORAL-left on 7/16  PAIN:  Are you having pain? NO: NPRS scale: 0/10 Pain location: Right hip Pain description: Ache Aggravating factors: any active Movement of right side Relieving factors: Rest  PRECAUTIONS: Fall  WEIGHT BEARING RESTRICTIONS Yes Bilateral non-weight bearing B LE's  FALLS: Has patient fallen in last 6 months? Yes. Number of falls 1  LIVING ENVIRONMENT: Lives with: lives with their family and lives in a nursing home Lives in: Other Long term care at [place]: No Has following equipment at home: Wheelchair (power), Ramped entry, and hoyer lift  PLOF: Needs assistance with ADLs, Needs assistance with homemaking, Needs assistance with gait, Needs assistance with transfers, and Leisure: Work puzzles, read, Watch TV  PATIENT GOALS Patient goal is to walk; Be able to strengthen my arms for better use  OBJECTIVE:   DIAGNOSTIC FINDINGS: Overall cognitive statusEXAM: DG HIP (WITH OR WITHOUT PELVIS) 2-3V LEFT;  DG HIP (WITH OR WITHOUT PELVIS) 2-3V RIGHT   COMPARISON:  CT of the abdomen and pelvis 01/21/2020   FINDINGS: Left proximal femur fracture noted just below the intertrochanteric region. Marked ventral angulation noted on the cross-table  image. Slight varus angulation present   Comminuted intratrochanteric fracture present on the right also with marked ventral angulation.   IMPRESSION: 1. Bilateral hip fractures. 2. Comminuted intertrochanteric fracture of the right hip. 3. Angulated fracture of the proximal left femur just below the intertrochanteric region.     Electronically Signed   By: San Morelle M.D.   On: 08/15/2021 11:54:   COGNITION:  Within functional limits for tasks assessed   SENSATION: Light touch: Impaired   EDEMA:  None  Strength R/L 2-/2- Shoulder flexion (anterior deltoid/pec major/coracobrachialis, axillary n. (C5-6) and musculocutaneous n. (C5-7)) 2-/3+ (within available ROM around 130 on lef)Shoulder abduction (deltoid/supraspinatus, axillary/suprascapular n, C5) 3+/3+ Shoulder external rotation (infraspinatus/teres minor) 4/4 Shoulder internal rotation (subcapularis/lats/pec major) 3+/3+ Shoulder extension (posterior deltoid, lats, teres major, axillary/thoracodorsal n.) 3+/3+ Shoulder horizontal abduction 4/4 Elbow flexion (biceps brachii, brachialis, brachioradialis, musculoskeletal n, C5-6) 4/4 Elbow extension (triceps, radial n, C7) 2-/2- Wrist Extension 3+/3+ Wrist Flexion   LOWER EXTREMITY MMT:    MMT Right 01/11/22 Left 01/11/22  Hip flexion 2- 2-  Hip extension 2- 2-  Hip abduction 2- 2-  Hip adduction 2- 3  Hip internal rotation 2- 2-  Hip external rotation 2- 2-  Knee flexion 2- 2-  Knee extension 2+ 3+  Ankle dorsiflexion 2- NT - boot  Ankle plantarflexion 2- NT- boot  Ankle inversion 2- NT- boot  Ankle eversion 2- NT- boot  (Blank rows = not tested)  BED MOBILITY:  Unable to test due to NWB status of BLE-  Patient currently dependent with all bed mobility using hoyer and caregiver assist  TRANSFERS: Currently BLE NWB- Dependent on use of hoyer for all transfers  FUNCTIONAL TESTs:   PATIENT SURVEYS:  FOTO 12  TODAY'S TREATMENT:   -COPIED FROM [facility]-DR. H. MILLER note from 12/30/2021 12/30/2021 12/30/2021 Assessment and Plan:  Impression: 1. Bilateral hip fractures.  Recommendation: The patient will continue the current program until 02/01/2022 when she may try some weightbearing exercises as well. The patient will follow up in 03/2022 for exam and x-ray of both hips.  ATTESTATION:  This note has been generated by Romero Liner and edited by Christiana Fuchs, Quality Documentation Specialist.     - COPIED FROM [facility]- DR. Jonny Ruiz Note from 11/18/2021:  11/18/2021 11/18/2021 Assessment and Plan:  Bilateral hip fractures.   Recommendation:  The patient's PT may start with range of motion and more activity with the hips. I would like to avoid weightbearing still. We will see her back here in 6 weeks for an exam and x-ray of both hips.     ** Per above instructions - now able to begin some weight bearing activities.  THERE.EX:    AROM- Left hip flex x 10 reps then fatigued so added AAROM x 10 reps and then 20 reps AAROM On right    Seated abdominal crunch: x15 forward with SBA.     Seated abdominal side bend for core mobility/strength and engagement of obliques. X15/side. SBA.     Seated LAQ with 3# AW on left LE - 2 sets of 15 reps   Seated LAQ with AAROM on right LE - 2 sets of 15 reps    Straight leg hip abd with PT holding heel for support - 2 sets of 15 reps each LE    AROM B knee flexion x 15 reps each LE.     Light manual resistance with right ankle DF/PF x 15 reps    PATIENT  EDUCATION: Education details: PT plan of care/use of resistive bands Person educated: Patient Education method: Explanation, Demonstration, Tactile cues, and Verbal  cues Education comprehension: verbalized understanding, returned demonstration, verbal cues required, tactile cues required, and needs further education   HOME EXERCISE PROGRAM: Discussed use of theraband and demo shoulder flex/abd and elbow flex and ext with use of yellow TB. Patient required assist to position therband but able to perform well with only one person assist  Access Code: [facility] URL: [email or web address] Date: 11/23/2021 Prepared by: Janna Arch Exercises - Seated Gluteal Sets - 2 x daily - 7 x weekly - 2 sets - 10 reps - 5 hold - Seated Quad Set - 2 x daily - 7 x weekly - 2 sets - 10 reps - 5 hold - Seated Long Arc Quad - 2 x daily - 7 x weekly - 2 sets - 10 reps - 5 hold - Seated March - 2 x daily - 7 x weekly - 2 sets - 10 reps - 5 hold - Seated Hip Abduction - 2 x daily - 7 x weekly - 2 sets - 10 reps - 5 hold - Seated Shoulder Shrugs - 2 x daily - 7 x weekly - 2 sets - 10 reps - 5 hold - Wheelchair Pressure Relief - 2 x daily - 7 x weekly - 2 sets - 10 reps - 5 hold  GOALS: Goals reviewed with patient? Yes  SHORT TERM GOALS: Target date: 02/22/2022  Pt will be independent with initial UE strengthening HEP in order to improve strength and balance in order to decrease fall risk and improve function at home and work. Baseline: 10/19/2021- patient with no formal UE HEP; 12/02/2021= Patient verbalized knowledge of HEP including use of theraband for UE strengthening.  Goal status: GOAL MET  LONG TERM GOALS: Target date: 04/05/2022  Pt will be independent with final for UE/LE HEP in order to improve strength and balance in order to decrease fall risk and improve function at home and work. Baseline: Patient is currently BLE NWB and unable to participate in HEP. Has order for UE strengthening. 01/11/2022- Patient now able to participate in LE strengthening although NWB still- good understanding for some basic exercises. Will keep goal active to incorporate progressive LE  strengthening exercises. Goal status: Progressing  2.  Pt will improve FOTO to target score of 35  to display perceived improvements in ability to complete ADL's.  Baseline: 10/19/2021= 12; 12/02/2021= 17; 01/11/2022=17 Goal status: Ongoing  3.  Pt will increase strength of B UE  by at least 1/2 MMT grade in order to demonstrate improvement in strength and function  Baseline: patient has range of 2-/5 to 4/5 BUE Strength; 12/02/2021= 4/5 except with wrist ext. 01/11/2022= 4/5 B UE strength expept for wrist Goal status: Partially met  ASSESSMENT:  CLINICAL IMPRESSION:  Treatment continued to focus on core/LE strengthening today- Patient able to accept more reps and some increased resistance with LAQ.  She remains well motivated despite being in uncomfortable manual w/c today while repairs are being made for her power w/c. She was provided adequate rest breaks- still unable to achieve more than 3/4 ROM with Right LE AROM. Pt will continue to benefit from skilled PT services to improve strength in anticipation for BLE clearance for [place] activities.    OBJECTIVE IMPAIRMENTS Abnormal gait, decreased activity tolerance, decreased balance, decreased coordination, decreased endurance, decreased mobility, difficulty walking, decreased ROM, decreased strength, hypomobility, impaired flexibility, impaired UE functional use, postural dysfunction, and  pain.   ACTIVITY LIMITATIONS carrying, lifting, bending, sitting, standing, squatting, sleeping, stairs, transfers, bed mobility, continence, bathing, toileting, dressing, self feeding, reach over head, hygiene/grooming, and caring for others  PARTICIPATION LIMITATIONS: meal prep, cleaning, laundry, medication management, personal finances, interpersonal relationship, driving, shopping, community activity, and yard work  PERSONAL FACTORS Age, Time since onset of injury/illness/exacerbation, and 1-2 comorbidities: HTN, cervical Sx  are also affecting patient's  functional outcome.   REHAB POTENTIAL: Good  CLINICAL DECISION MAKING: Evolving/moderate complexity  EVALUATION COMPLEXITY: Moderate  PLAN: PT FREQUENCY: 1-2x/week  PT DURATION: 12 weeks  PLANNED INTERVENTIONS: Therapeutic exercises, Therapeutic activity, Neuromuscular re-education, Balance training, Gait training, Patient/Family education, Self Care, Joint mobilization, DME instructions, Dry Needling, Electrical stimulation, Wheelchair mobility training, Spinal mobilization, Cryotherapy, Moist heat, and Manual therapy  PLAN FOR NEXT SESSION: Continue with weight bearing activities and LE strengthening as appropriate. [place]ina lift?  Ollen Bowl, PT Physical Therapist- [facility]  03/03/2022, 1:15 PM

## 2022-03-03 NOTE — Therapy (Signed)
Occupational Therapy Treatment Note    Patient Name: Sherry [name] MRN: [id number] DOB:[birth year], 88 y.o., female Today's Date: 03/03/2022                                         PCP: Dr. Viviana Simpler REFERRING PROVIDER: Dr. Viviana Simpler   OT End of Session - 03/03/22 1748     Visit Number 34    Number of Visits 2    Date for OT Re-Evaluation 04/28/22    Authorization Time Period Progress report period starting 12/14/2021    OT Start Time 1107    OT Stop Time 1145    OT Time Calculation (min) 38 min    Activity Tolerance Patient tolerated treatment well    Behavior During Therapy [facility] for tasks assessed/performed                 Past Medical History:  Diagnosis Date   Acute blood loss anemia    Arthritis    Cancer ([place])    skin   Central cord syndrome at C6 level of cervical spinal cord ([place]) 11/29/2017   Hypertension    Protein-calorie malnutrition, severe ([place]) 01/24/2018   S/P insertion of IVC (inferior vena caval) filter 01/24/2018   Tetraparesis ([place])    Past Surgical History:  Procedure Laterality Date   ANTERIOR CERVICAL DECOMP/DISCECTOMY FUSION N/A 11/29/2017   Procedure: Cervical five-six, six-seven Anterior Cervical Decompression Fusion;  Surgeon: Judith Part, MD;  Location: [facility];  Service: Neurosurgery;  Laterality: N/A;  Cervical five-six, six-seven Anterior Cervical Decompression Fusion   CATARACT EXTRACTION     FEMUR IM NAIL Left 08/16/2021   Procedure: INTRAMEDULLARY (IM) NAIL FEMORAL;  Surgeon: Earnestine Leys, MD;  Location: [facility];  Service: Orthopedics;  Laterality: Left;   IR IVC FILTER PLMT / S&I /IMG GUID/MOD SED  12/08/2017   Patient Active Problem List   Diagnosis Date Noted   Hypertensive heart disease with other congestive heart failure ([place]) 02/14/2022   Chronic indwelling Foley catheter 02/14/2022   Closed hip fracture ([place]) 08/15/2021   DVT (deep venous thrombosis) ([place]) 08/15/2021   Quadriplegia ([place]) 08/15/2021    Fall 08/15/2021   Constipation due to slow transit 08/31/2018   Trauma 06/05/2018   Neuropathic pain 06/05/2018   Neurogenic bowel 06/05/2018   Vaginal yeast infection 01/30/2018   Healthcare-associated pneumonia 01/25/2018   Chronic allergic rhinitis 01/24/2018   Depression with anxiety 01/24/2018   UTI due to Klebsiella species 01/24/2018   Protein-calorie malnutrition, severe ([place]) 01/24/2018   S/P insertion of IVC (inferior vena caval) filter 01/24/2018   Tetraparesis ([place]) 01/20/2018   Neurogenic bladder 01/20/2018   Reactive depression    Benign essential HTN    Acute postoperative anemia due to expected blood loss    Central cord syndrome at C6 level of cervical spinal cord ([place]) 11/29/2017   Allergy to alpha-gal 11/25/2016   SCC (squamous cell carcinoma) 04/08/2014    REFERRING DIAG: Central Cord Syndrome at C6 of the Cervical Spinal Cord, Fall with Bilateral Closed Hip Fractures, with ORIF repair with Intramedullary Nailing of the Left Hip Fracture.  THERAPY DIAG:  Muscle weakness (generalized)  Rationale for Evaluation and Treatment Rehabilitation  PERTINENT HISTORY: Patient s/p fall November 29, 2017 resulting in diagnosis of central cord syndrome at C 6 level.  She has had therapy in multiple venues but with recent move to [facility]  [place], therapy staff recommended she seek outpatient therapy for her needs. Pt. Had a recent fall with Bilateral Closed Hip Fractures, with ORIF repair with Intramedullary Nailing of the Left Hip Fracture.   PRECAUTIONS: None  SUBJECTIVE:   Pt. reports missing her independence without her power chair  PAIN:  Are you having pain? No     OBJECTIVE:   TODAY'S TREATMENT:   Manual Therapy:   Pt. tolerated soft tissue massage to the volar, and dorsal surface of each digit on the bilateral hands secondary to stiffness following moist heat modality. Manual therapy was performed independent of, and in preparation for therapeutic Ex.    Therapeutic Ex.    Pt. tolerated PROM followed by AROM bilateral wrist extension, PROM for bilateral digit MP, PIP, and DIP flexion, and extension, thumb radial, and palmar abduction. There. Ex. was performed following moist heat, and manual therapy.    Pt. arrived in a manual w/c today reporting that the joystick stopped working, they had to place an order for one which will arrive in 2 weeks. Pt. continues to make progress with AROM in right shoulder flexion, and bilateral UE strength, and continues to try to engage her bilateral hands more during daily ADL, and IADL tasks. Pt. continues to tolerate There. Ex. stretches and manual therapy well today. Pt. continues to present with tightness at the bilateral thumb webspace, and bilateral wrist flexor tightness, as well as digit MP, PIP, and DIP extensor tightness which continues to limit her ability to complete ADL tasks efficiently. Pt. continues to benefit from working on impoving ROM in order to work towards increasing bilateral hand grasp on objects, and increase engagement of her bilateral hands during ADLs, and IADL tasks.   PATIENT EDUCATION: Education details: BUE ROM, hand function, UE functioning Person educated: Patient Education method: Explanation and Demonstration Education comprehension: verbalized understanding, returned demonstration, and needs further education   HOME EXERCISE PROGRAM Continue with ongoing HEPs  Measurements:  12/09/2021:  Shoulder flexion: R: 120(136), L: 138(150) Shoulder abduction: R: 108(120), L: 110(120) Elbow: R: 0-148, L: 0-146 Wrist flexion: R: 55(62), L: 78 Writ extension: R: 34(55), L: 3(4) RD: R: 12(20), L: 16(26) UD: R: 8(24), L: 6(18) Thumb radial abduction: R: 11(20), L: 8(20) Digit flexion to the [facility]: R:  2nd: 5cm(3.5cm), 3rd: 7.5 cm(5cm), 4th: 7cm(3cm), 5th: 6cm(5.5cm) L: 2nd: 8cm(8cm), 3rd: 8cm(8cm), 4th: 7.5cm(7cm), 5th: 7cm(7cm)  02/03/2022:   Shoulder flexion: R: 122(138), L:  148(154) Shoulder abduction: R: 109(125), L: 125(125 Elbow: R: 0-148, L: 0-146 Wrist flexion: R: 60(68), L: 79 Writ extension: R: 40(55), L: 3(10) RD: R: 14(24), L: 20(28) UD: R: 8(24), L: 6(18) Thumb radial abduction: R: 20(25), L: 8(20) Digit flexion to the [facility]: R:  2nd: 4.5cm(3cm), 3rd: 6.5cm(4.5cm), 4th: 6.5 cm(3cm), 5th: 4.5cm(5cm) L: 2nd: 8cm(8cm), 3rd: 8cm(7cm), 4th: 7.5cm(7cm), 5th: 6.5cm(6.5cm)            OT Long Term Goals - 07/06/21 1106                                                                                          Target Date: 04/28/2022    OT LONG  TERM GOAL #1   Title Patient and caregiver will demonstrate understanding of home exercise program for ROM.    Baseline 12/09/2021: Pt. Reports the restorative ROM program has not resumed at the facility. 10/28/2021: Pt. Is attempting exercises/stretches at home. The facility restorative aides have not resumed exercises. 09/14/2021:  Pt. Reports inconsistencies with restorative ROM at  her care home. Pt/caregiver education to be provided about ongoing ROM needs.    Time 12    Period Weeks    Status Deferred     OT LONG TERM GOAL #2   Title Pt.  will improve BUE strength to be able to sustain her UE's in elevation to efficiently, and effectively perform hair care, and self-grooming tasks.   Baseline 02/03/2022: Pt. has improved, and is now able to sustain her Bilateral UEs in elevation long enough to efficiently, and effectively perform haircare, and self-grooming tasks. 12/09/2021: right shoulder 4-/5, left shoulder 4/5. Pt. has improved to be able to reach up for hair care, and self-grooming tasks. 10/28/2021: Right shoulder 3+/5, Left shoulder 4-/5 09/14/2021:  BUE shoulder strength: 3+/5 for shoulder flexion, and abduction.   Time 12    Period Weeks    Status Achieved               OT LONG TERM GOAL #3   Title Patient will demonstrate improved composite finger flexion to hold deodorant to apply to underarms.     Baseline 02/03/2022: Pt. Has improved with digit flexion, however continues to have difficulty securely holding and using deoderant. 12/09/2021: Bilateral hand/digit MP, PIP, and DIP extension tightness limits her ability achieve digit flexion to hold, and apply deodorant. 10/28/2021:  Pt. Continues to have difficulty holding the deodorant. 01/12/2021: Pt. Presents with limited digit extension. Pt. Is able to initiate holding deodorant, however is unable to hold it while using it.   Time 12    Period Weeks    Status Ongoing   Target Date                  OT LONG TERM GOAL #4   Title Pt. Will improve bilateral wrist extension in preparation for anticipating, and initiating reaching for objects at the table.    Baseline 02/03/2022: Right: 34(55) Left: 3(4) 12/09/2021: Right  34(55)  Left  3(4) 10/28/2021:  Right 22(38), Left 0(15) 09/14/2021:  Right: 17(35), left 2(15)   Time 12   Period weeks   Status Ongoing   Target Date      OT LONG TERM GOAL #5   Title Pt. will write, and sign her name with 100% legibility, and modified independence.    Baseline 02/03/2022: Pt. Continues to report legibility appears "shaky" when attempting signing her name. Pt.reports that she is writing more when filling out her menu, and complete puzzles.12/09/2021: Pt. Reports legibility appears "shaky" when attempting signing her name. Pt.reports that she is writing more when filling out her menu. 10/28/2021: pt. is able to sign her name, however legibility continues to be inconsistent. Pt. Reports "shaky appearance". 09/14/2021: Pt. continues to fill out her daily menu and complete puzzles. Pt. Continues to present with limited writing legibility.   Time 12    Period Weeks    Status  Ongoing         OT LONG TERM GOAL  #6   TITLE Pt. will turn pages in a book with modified independence.    Baseline 02/03/2022: Pt. Is able to effectively turn pages of a book. 10/28/2021: Pt. Is  able to consistently turn book pages. 09/14/2021:  Pt. has difficulty turning pages.    Time 12    Period Weeks    Status Achieved       OT LONG TERM GOAL  #7  TITLE Pt. Will increase  bilateral lateral pinch strength by 2 lbs to be able to securely grasp items during ADLs, and IADL tasks.   Baseline 02/03/2022: Right: 6#, Left: 4# 12/09/2021:  Right: 6#, Left: 4#   Time 12  Period Weeks  Status Ongoing           OT LONG TERM GOAL  #8  TITLE Pt. Will button a sweater with large 1" buttons, and zip her jacket with modified independence.   Baseline 02/03/2022: Pt. is unable to manipulate buttons on her sweater, or zippers on her jacket.  Time 12  Period Weeks  Status  New   OT LONG TERM GOAL  #9  TITLE Pt. will complete plant care with modified independence.  Baseline 02/03/2022: Pt. has difficulty caring for her plants.   Time 12  Period Weeks  Status New   OT LONG TERM GOAL  #10  TITLE Pt. will demonstrate adaptive techniques to assist with the efficiency of self-dressing, or morning care tasks.  Baseline 02/03/2022: Pt. Requires assist from caregivers 2/2 time limitations during morning care.   Time 12  Period Weeks  Status New     Plan - 07/22/21 1737     Clinical Impression Statement Pt. arrived in a manual w/c today reporting that the joystick stopped working, they had to place an order for one which will arrive in 2 weeks. Pt. continues to make progress with AROM in right shoulder flexion, and bilateral UE strength, and continues to try to engage her bilateral hands more during daily ADL, and IADL tasks. Pt. continues to tolerate There. Ex. stretches and manual therapy well today. Pt. continues to present with tightness at the bilateral thumb webspace, and bilateral wrist flexor tightness, as well as digit MP, PIP, and DIP extensor tightness which continues to limit her ability to complete ADL tasks efficiently. Pt. continues to benefit from working on impoving ROM in order to work towards increasing bilateral hand grasp on  objects, and increase engagement of her bilateral hands during ADLs, and IADL tasks.  .                     OT Occupational Profile and History Detailed Assessment- Review of Records and additional review of physical, cognitive, psychosocial history related to current functional performance    Occupational performance deficits (Please refer to evaluation for details): ADL's;IADL's;Leisure;Social Participation    Body Structure / Function / Physical Skills ADL;Continence;Dexterity;Flexibility;Strength;ROM;Balance;Coordination;FMC;IADL;Endurance;UE functional use;Decreased knowledge of use of DME;[facility]    Psychosocial Skills Coping Strategies    Rehab Potential Fair    Clinical Decision Making Several treatment options, min-mod task modification necessary    Comorbidities Affecting Occupational Performance: Presence of comorbidities impacting occupational performance    Comorbidities impacting occupational performance description: contractures of bilateral hands, dependent transfers,    Modification or Assistance to Complete Evaluation  No modification of tasks or assist necessary to complete eval    OT Frequency 2x / week    OT Duration 12 weeks    OT Treatment/Interventions Self-care/ADL training;Cryotherapy;Therapeutic exercise;DME and/or AE instruction;Balance training;Neuromuscular education;Manual Therapy;Splinting;Moist Heat;Passive range of motion;Therapeutic activities;Patient/family education    Plan continue to progress ROM of digits, wrists and shoulders as it pertains to completion of ADL tasks  that pt values.    Consulted and Agree with Plan of Care Patient            Harrel Carina, MS, OTR/L  Harrel Carina, OT 03/03/2022, 5:51 PM      `

## 2022-03-04 ENCOUNTER — Telehealth: Payer: Medicare HMO | Admitting: Student

## 2022-03-04 DIAGNOSIS — S72009A Fracture of unspecified part of neck of unspecified femur, initial encounter for closed fracture: Secondary | ICD-10-CM | POA: Diagnosis not present

## 2022-03-04 DIAGNOSIS — G8250 Quadriplegia, unspecified: Secondary | ICD-10-CM

## 2022-03-04 DIAGNOSIS — S14126S Central cord syndrome at C6 level of cervical spinal cord, sequela: Secondary | ICD-10-CM

## 2022-03-04 DIAGNOSIS — S14126D Central cord syndrome at C6 level of cervical spinal cord, subsequent encounter: Secondary | ICD-10-CM | POA: Diagnosis not present

## 2022-03-04 NOTE — Telephone Encounter (Addendum)
Patient Location  = Daughter's home  Provider Location = [facility]  Method = telephone   Called patient's daughter to discuss mother's current status.  Question about the sediment in the urine, is it because of the Calcium? Could be due to bacteria present in bladder. Started on antibiotics.  She is now cleared for PT. Now, would like to cut back on her calcium supplementation. So Calcium 600 mg nightly. Continue vitamin D.  CBC showed she is still anemic, will plan for repeat CBC, iron panel, [id number], and folic acid in this months' lab collection Power wheelchair is broken and she needs a new one. Patient has had the current one for 4 years and due to living in LTC it will have to be private pay. - needs to have faxed physician's order, note, and letter outlining patient's needs to therapist to help with obtaining new chair.   I spent 17 minutes on the phone updating patient's daughter discussing care plan and coordination of care.   Tomasa Rand, MD, [place]

## 2022-03-05 ENCOUNTER — Non-Acute Institutional Stay (SKILLED_NURSING_FACILITY): Payer: Medicare HMO | Admitting: Student

## 2022-03-05 ENCOUNTER — Encounter: Payer: Self-pay | Admitting: Student

## 2022-03-05 DIAGNOSIS — G8250 Quadriplegia, unspecified: Secondary | ICD-10-CM | POA: Diagnosis not present

## 2022-03-05 DIAGNOSIS — Z978 Presence of other specified devices: Secondary | ICD-10-CM

## 2022-03-05 DIAGNOSIS — N390 Urinary tract infection, site not specified: Secondary | ICD-10-CM

## 2022-03-05 DIAGNOSIS — T83511A Infection and inflammatory reaction due to indwelling urethral catheter, initial encounter: Secondary | ICD-10-CM

## 2022-03-05 DIAGNOSIS — R140 Abdominal distension (gaseous): Secondary | ICD-10-CM | POA: Diagnosis not present

## 2022-03-05 NOTE — Progress Notes (Unsigned)
Location:  [facility] Room Number: 219A Place of Service:  SNF [phone]) Provider:  Dewayne Shorter, MD  Dewayne Shorter, MD  Patient Care Team: Dewayne Shorter, MD as PCP - General (Family Medicine)  Extended Emergency Contact Information Primary Emergency Contact: [name] Mobile Phone: [phone] Relation: Daughter Secondary Emergency Contact: Lindi Adie Address: [address] [place]          [place], [place] [address] Johnnette Litter of [place] Phone: [phone] Relation: Spouse  Code Status:  DNR Goals of care: Advanced Directive information    03/05/2022   10:37 AM  Advanced Directives  Does Patient Have a Medical Advance Directive? Yes  Type of Paramedic of [place];Living will;Out of facility DNR (pink MOST or yellow form)  Does patient want to make changes to medical advance directive? No - Patient declined  Copy of [place] in Chart? Yes - validated most recent copy scanned in chart (See row information)  Pre-existing out of facility DNR order (yellow form or pink MOST form) Yellow form placed in chart (order not valid for inpatient use)     Chief Complaint  Patient presents with   Acute Visit    Urinary problems    HPI:  Pt is a 88 y.o. female seen today for an acute visit for follow up of urinary issues.   She hasn't been out of the room she has a UTI. She is not feeling great. She feels anxious about what is going to happen next. She has had her catheter removed and replaced numerous times over the last week.   Discussed concern of her risk for urinary retention, patient is interested in TOV to prevent use of foley given her level of discomfort.   Patient continues to have bloating and isn't sure why-- she has bowel movements regularly.    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[place])    Past Surgical History:  Procedure Laterality Date   ANTERIOR CERVICAL DECOMP/DISCECTOMY FUSION N/A 11/29/2017   Procedure: Cervical five-six, six-seven Anterior Cervical Decompression Fusion;  Surgeon: Judith Part, MD;  Location: [facility];  Service: Neurosurgery;  Laterality: N/A;  Cervical five-six, six-seven Anterior Cervical Decompression Fusion   CATARACT EXTRACTION     FEMUR IM NAIL Left 08/16/2021   Procedure: INTRAMEDULLARY (IM) NAIL FEMORAL;  Surgeon: Earnestine Leys, MD;  Location: [facility];  Service: Orthopedics;  Laterality: Left;   IR IVC FILTER PLMT / S&I /IMG GUID/MOD SED  12/08/2017    Allergies  Allergen Reactions   Other Swelling   Lisinopril-Hydrochlorothiazide Swelling    Swelling of the tongue   Beef-Derived Products Swelling    Facial and lip swelling (due to tick bite)   Dairy Aid [Tilactase] Swelling   Lisinopril-Hydrochlorothiazide Swelling    Tongue swelling   Pork-Derived Products Swelling    Allergies as of 03/05/2022       Reactions   Other Swelling   Lisinopril-hydrochlorothiazide Swelling   Swelling of the tongue   Beef-derived Products Swelling   Facial and lip swelling (due to tick bite)   Dairy Aid [tilactase] Swelling   Lisinopril-hydrochlorothiazide Swelling  Tongue swelling   Pork-derived Products Swelling        Medication List        Accurate as of March 05, 2022 11:59 PM. If you have any questions, ask your nurse or doctor.          STOP taking these medications    TUMS PO Stopped by: Dewayne Shorter, MD       TAKE these medications    acetaminophen 500 MG tablet Commonly known as: TYLENOL Take 500 mg by mouth every 12 (twelve) hours as needed for mild pain.   acetaminophen 650 MG CR  tablet Commonly known as: TYLENOL Take 650 mg by mouth every 8 (eight) hours as needed for pain.   acetic acid 0.25 % irrigation Use 20m via irrigation two times daily   alendronate 70 MG tablet Commonly known as: FOSAMAX Take 70 mg by mouth every Saturday.   alum & mag hydroxide-simeth 200-200-20 MG/5ML suspension Commonly known as: MAALOX/MYLANTA Take 30 mLs by mouth every 4 (four) hours as needed for indigestion.   calcium carbonate 1500 (600 Ca) MG Tabs tablet Commonly known as: OSCAL Take 1 tablet by mouth 2 (two) times daily.   Capsaicin 0.1 % Crea Apply 1 application  topically every evening. (Apply to hands)   carboxymethylcellulose 0.5 % Soln Commonly known as: REFRESH PLUS Place 2 drops into both eyes every 6 (six) hours as needed (dry eyes).   cetirizine 5 MG tablet Commonly known as: ZYRTEC Take 5 mg by mouth daily.   cholecalciferol 25 MCG (1000 UNIT) tablet Commonly known as: VITAMIN D3 Take 1,000 Units by mouth daily.   Dermacloud Oint Apply to buttocks/gluteal folds topically every shift.   DULoxetine 60 MG capsule Commonly known as: CYMBALTA Take 60 mg by mouth daily.   EPINEPHrine 0.3 mg/0.3 mL Soaj injection Commonly known as: EPI-PEN Inject 0.3 mg into the muscle as needed for anaphylaxis.   ferrous sulfate 325 (65 FE) MG tablet Take 325 mg by mouth 2 (two) times daily with a meal.   gabapentin 100 MG capsule Commonly known as: NEURONTIN Take 1 capsule (100 mg total) by mouth daily.   guaiFENesin 600 MG 12 hr tablet Commonly known as: MUCINEX Take 600 mg by mouth 2 (two) times daily as needed for to loosen phlegm.   lactulose (encephalopathy) 10 GM/15ML Soln Commonly known as: CHRONULAC Take 30 mLs by mouth daily as needed (mild constipation).   magnesium citrate solution Take 60-120 mLs by mouth daily as needed (constipation).   methylcellulose oral powder Take 1 packet by mouth at bedtime.   mometasone 0.1 % lotion Commonly  known as: ELOCON Apply 1 Application topically every Saturday at 6 PM. (Apply to the ear canal for eczema)   multivitamin with minerals Tabs tablet Take 1 tablet by mouth daily.   polyethylene glycol 17 g packet Commonly known as: MIRALAX / GLYCOLAX Take 17 g by mouth 2 (two) times daily.   rivaroxaban 20 MG Tabs tablet Commonly known as: XARELTO Take 1 tablet (20 mg total) by mouth daily with supper.   saccharomyces boulardii 250 MG capsule Commonly known as: FLORASTOR Take 250 mg by mouth daily.   Saline Spray 0.2 % Soln Place 2 sprays into both nostrils as needed (nasal congestion).   simethicone 80 MG chewable tablet Commonly known as: MYLICON Chew 80 mg by mouth every 6 (six) hours as needed for flatulence.   sodium phosphate 7-19 GM/118ML Enem Place 1 enema rectally every other day as needed  for severe constipation.   spironolactone 25 MG tablet Commonly known as: ALDACTONE Take 1 tablet (25 mg total) by mouth daily.   sulfamethoxazole-trimethoprim 800-160 MG tablet Commonly known as: BACTRIM DS Take 1 tablet by mouth 2 (two) times daily. For 10 days   tiZANidine 2 MG tablet Commonly known as: ZANAFLEX Take 1 tablet (2 mg total) by mouth 2 (two) times daily.   triamcinolone cream 0.1 % Commonly known as: KENALOG Apply to thigh and lower legs topically every 8 hours as needed. Apply to back of neck topically as needed.        Review of Systems  Immunization History  Administered Date(s) Administered   Influenza, High Dose Seasonal PF 11/17/2021   Influenza-Unspecified 11/14/2018, 11/20/2019, 11/17/2020   Moderna Covid-19 Vaccine Bivalent Booster 30yr & up 06/30/2021, 12/11/2021   Moderna Sars-Covid-2 Vaccination 02/09/2019, 03/09/2019, 12/14/2019, 06/20/2020, 10/24/2020   PPD Test 05/16/2019   Tdap 11/29/2017   Pertinent  Health Maintenance Due  Topic Date Due   INFLUENZA VACCINE  Completed   DEXA SCAN  Discontinued      08/18/2021   12:00 PM  08/18/2021    8:00 PM 08/19/2021   10:00 AM 08/19/2021    8:54 PM 08/20/2021    8:20 AM  Fall Risk  (RETIRED) Patient Fall Risk Level High fall risk High fall risk High fall risk High fall risk High fall risk   Functional Status Survey:    Vitals:   03/05/22 1035 03/05/22 1628  BP: (!) 97/51 118/79  Pulse: 91   Resp: 18   Temp: 98.9 F (37.2 C)   SpO2: 95%   Weight: 168 lb 6.4 oz (76.4 kg)   Height: '5\' 7"'$  (1.702 m)    Body mass index is 26.38 kg/m. Physical Exam Constitutional:      Appearance: Normal appearance.  Neurological:     Mental Status: She is alert.     Labs reviewed: Recent Labs    08/18/21 0423 08/19/21 0333 08/20/21 0443 09/24/21 0000 02/08/22 0000  NA 124* 130* 130* 137 137  K 4.6 5.1 4.5 4.7 4.8  CL 95* 102 101 101 98*  CO2 '23 23 26 '$ 30* 33*  GLUCOSE 117* 117* 101*  --   --   BUN 29* '23 15 11 10  '$ CREATININE 0.96 0.72 0.54 0.5 0.5  CALCIUM 7.1* 7.4* 7.1* 8.5* 9.0   Recent Labs    09/24/21 0000 02/08/22 0000  AST  --  18  ALT  --  14  ALKPHOS  --  65  ALBUMIN 3.2* 3.7   Recent Labs    08/15/21 1035 08/16/21 1333 08/18/21 0423 08/18/21 1759 08/19/21 0333 08/20/21 0443 08/20/21 1029 11/26/21 0000 02/08/22 0000  WBC 7.4   < > 13.0*  --  10.6* 8.9  --  6.3 5.5  NEUTROABS 5.2  --   --   --   --   --   --   --  3,036.00  HGB 12.9   < > 7.7*   < > 8.9* 7.8* 8.6* 10.5* 11.7*  HCT 41.3   < > 22.7*   < > 27.5* 23.7*  --  32* 35*  MCV 89.8   < > 87.6  --  88.4 87.1  --   --   --   PLT 335   < > 180  --  198 216  --  346 321   < > = values in this interval not displayed.   No results found for: "  TSH" No results found for: "HGBA1C" No results found for: "CHOL", "HDL", "LDLCALC", "LDLDIRECT", "TRIG", "CHOLHDL"  Significant Diagnostic Results in last 30 days:  No results found.  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 Will trial of void. Bladder scans q8 hours I&O for >400 mL. Will follow up on Monday to determine whether or not patient is amenable to replacing foley catheter or not. KUB to evaluate abdominal distension. Paperwork sent to help with patient receiving a new power wheelchair. Continue physical therapy. Continue treatment for UTI with Bactrim as both the species of bacteria present were susceptible continue for 14 day course.    Family/ staff Communication: Daughter (prior to visit per telephone note) and nursing   Labs/tests ordered:   CBC, CMP, Iron Panel, O72, Folic Acid

## 2022-03-05 NOTE — Progress Notes (Signed)
Letter for patient to receive new wheelchair.

## 2022-03-08 ENCOUNTER — Ambulatory Visit: Payer: Medicare HMO

## 2022-03-08 ENCOUNTER — Encounter: Payer: Self-pay | Admitting: Student

## 2022-03-08 ENCOUNTER — Ambulatory Visit: Payer: Medicare HMO | Admitting: Occupational Therapy

## 2022-03-08 DIAGNOSIS — I10 Essential (primary) hypertension: Secondary | ICD-10-CM

## 2022-03-08 LAB — COMPREHENSIVE METABOLIC PANEL
Calcium: 8 — AB (ref 8.7–10.7)
eGFR: 84

## 2022-03-08 LAB — CBC AND DIFFERENTIAL
HCT: 35 — AB (ref 36–46)
Hemoglobin: 11.9 — AB (ref 12.0–16.0)
Neutrophils Absolute: 3683
Platelets: 360 10*3/uL (ref 150–400)
WBC: 6.2

## 2022-03-08 LAB — BASIC METABOLIC PANEL
BUN: 11 (ref 4–21)
CO2: 28 — AB (ref 13–22)
Chloride: 97 — AB (ref 99–108)
Creatinine: 0.6 (ref 0.5–1.1)
Glucose: 111
Potassium: 4 mEq/L (ref 3.5–5.1)
Sodium: 133 — AB (ref 137–147)

## 2022-03-08 LAB — CBC: RBC: 3.8 — AB (ref 3.87–5.11)

## 2022-03-08 LAB — VITAMIN B12: Vitamin B-12: 765

## 2022-03-08 LAB — IRON,TIBC AND FERRITIN PANEL: Iron: 24

## 2022-03-08 NOTE — Progress Notes (Signed)
A user error has taken place: encounter opened in error, closed for administrative reasons.

## 2022-03-10 ENCOUNTER — Ambulatory Visit: Payer: Medicare HMO

## 2022-03-10 ENCOUNTER — Ambulatory Visit: Payer: Medicare HMO | Admitting: Occupational Therapy

## 2022-03-11 ENCOUNTER — Ambulatory Visit: Payer: Medicare HMO | Admitting: Dermatology

## 2022-03-12 ENCOUNTER — Encounter: Payer: Self-pay | Admitting: Student

## 2022-03-15 ENCOUNTER — Ambulatory Visit: Payer: Medicare HMO

## 2022-03-15 ENCOUNTER — Non-Acute Institutional Stay (SKILLED_NURSING_FACILITY): Payer: Medicare HMO | Admitting: Student

## 2022-03-15 ENCOUNTER — Ambulatory Visit: Payer: Medicare HMO | Admitting: Occupational Therapy

## 2022-03-15 DIAGNOSIS — K592 Neurogenic bowel, not elsewhere classified: Secondary | ICD-10-CM

## 2022-03-15 DIAGNOSIS — G8250 Quadriplegia, unspecified: Secondary | ICD-10-CM

## 2022-03-15 DIAGNOSIS — I10 Essential (primary) hypertension: Secondary | ICD-10-CM

## 2022-03-15 DIAGNOSIS — K5901 Slow transit constipation: Secondary | ICD-10-CM | POA: Diagnosis not present

## 2022-03-15 DIAGNOSIS — I5089 Other heart failure: Secondary | ICD-10-CM

## 2022-03-15 DIAGNOSIS — I824Y3 Acute embolism and thrombosis of unspecified deep veins of proximal lower extremity, bilateral: Secondary | ICD-10-CM

## 2022-03-15 DIAGNOSIS — I110 Hypertensive heart disease with heart failure: Secondary | ICD-10-CM

## 2022-03-15 DIAGNOSIS — Z978 Presence of other specified devices: Secondary | ICD-10-CM

## 2022-03-15 DIAGNOSIS — N319 Neuromuscular dysfunction of bladder, unspecified: Secondary | ICD-10-CM

## 2022-03-15 DIAGNOSIS — S14126S Central cord syndrome at C6 level of cervical spinal cord, sequela: Secondary | ICD-10-CM

## 2022-03-15 NOTE — Progress Notes (Signed)
Location[facility] Room Number: [id number] Place of Service:  SNF (31) Provider:  Theadore Nan, MD  Patient Care Team: Dewayne Shorter, MD as PCP - General (Family Medicine)  Extended Emergency Contact Information Primary Emergency Contact: [name] Mobile Phone: [phone] Relation: Daughter Secondary Emergency Contact: Lindi Adie Address: [address] [place]          [place], [place] [address] Johnnette Litter of [place] Phone: [phone] Relation: Spouse  Code Status:  Full Code Goals of care: Advanced Directive information    03/08/2022   11:21 AM  Advanced Directives  Does Patient Have a Medical Advance Directive? Yes  Type of Paramedic of [place];Out of facility DNR (pink MOST or yellow form);Living will  Does patient want to make changes to medical advance directive? No - Patient declined  Copy of [place] in Chart? Yes - validated most recent copy scanned in chart (See row information)     Chief Complaint  Patient presents with   wants foley replaced    HPI:  Pt is a 88 y.o. female seen today for an acute and routine visit for neurogenic bladder and slow transit constipation. Continues to feel bloated. She has had limited mobility since her wheelchair has been broken. Her wheelchair is scheduled to get fixed soon and her daughter is trying to purchase on private pay for back up because it keeps her so independent. She wants to have foley catheter replaced so she does not have to go back and forth to therapy with a wet diaper.    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[place])    Past Surgical  History:  Procedure Laterality Date   ANTERIOR CERVICAL DECOMP/DISCECTOMY FUSION N/A 11/29/2017   Procedure: Cervical five-six, six-seven Anterior Cervical Decompression Fusion;  Surgeon: Judith Part, MD;  Location: [facility];  Service: Neurosurgery;  Laterality: N/A;  Cervical five-six, six-seven Anterior Cervical Decompression Fusion   CATARACT EXTRACTION     FEMUR IM NAIL Left 08/16/2021   Procedure: INTRAMEDULLARY (IM) NAIL FEMORAL;  Surgeon: Earnestine Leys, MD;  Location: [facility];  Service: Orthopedics;  Laterality: Left;   IR IVC FILTER PLMT / S&I /IMG GUID/MOD SED  12/08/2017    Allergies  Allergen Reactions   Other Swelling   Lisinopril-Hydrochlorothiazide Swelling    Swelling of the tongue   Beef-Derived Products Swelling    Facial and lip swelling (due to tick bite)   Dairy Aid [Tilactase] Swelling   Lisinopril-Hydrochlorothiazide Swelling    Tongue swelling   Pork-Derived Products Swelling    Outpatient Encounter Medications as of 03/15/2022  Medication Sig   acetic acid 0.25 % irrigation    acetaminophen (TYLENOL) 500 MG tablet Take 500 mg by mouth every 12 (twelve) hours as needed for mild pain.   acetaminophen (TYLENOL) 650 MG CR tablet Take 650 mg by mouth every 8 (eight) hours as needed for pain.   alendronate (FOSAMAX) 70 MG tablet Take 70 mg by mouth every Saturday.   calcium carbonate (OSCAL) 1500 (600 Ca) MG TABS tablet Take 1 tablet by mouth daily with breakfast.   Capsaicin 0.1 %  CREA Apply 1 application  topically every evening. (Apply to hands)   carboxymethylcellulose (REFRESH PLUS) 0.5 % SOLN Place 2 drops into both eyes every 6 (six) hours as needed (dry eyes).   cetirizine (ZYRTEC) 5 MG tablet Take 5 mg by mouth daily.   cholecalciferol (VITAMIN D3) 25 MCG (1000 UNIT) tablet Take 1,000 Units by mouth daily.   DULoxetine (CYMBALTA) 60 MG capsule Take 60 mg by mouth daily.   EPINEPHrine 0.3 mg/0.3 mL IJ SOAJ injection Inject 0.3 mg into the muscle as  needed for anaphylaxis.   ferrous sulfate 325 (65 FE) MG tablet Take 325 mg by mouth 2 (two) times daily with a meal.   gabapentin (NEURONTIN) 100 MG capsule Take 1 capsule (100 mg total) by mouth daily.   guaiFENesin (MUCINEX) 600 MG 12 hr tablet Take 600 mg by mouth 2 (two) times daily as needed for to loosen phlegm.   Infant Care Products [facility]) OINT Apply to buttocks/gluteal folds topically every shift.   lactulose, encephalopathy, (CHRONULAC) 10 GM/15ML SOLN Take 30 mLs by mouth daily as needed (mild constipation).   magnesium citrate solution Take 60-120 mLs by mouth daily as needed (constipation).   methylcellulose oral powder Take 1 packet by mouth at bedtime.   mometasone (ELOCON) 0.1 % lotion Apply 1 Application topically every Saturday at 6 PM. (Apply to the ear canal for eczema)   Multiple Vitamin (MULTIVITAMIN WITH MINERALS) TABS tablet Take 1 tablet by mouth daily.   polyethylene glycol (MIRALAX / GLYCOLAX) 17 g packet Take 17 g by mouth 2 (two) times daily.   rivaroxaban (XARELTO) 20 MG TABS tablet Take 1 tablet (20 mg total) by mouth daily with supper.   saccharomyces boulardii (FLORASTOR) 250 MG capsule Take 250 mg by mouth daily.   Saline Spray 0.2 % SOLN Place 2 sprays into both nostrils as needed (nasal congestion).   simethicone (MYLICON) 80 MG chewable tablet Chew 80 mg by mouth every 6 (six) hours as needed for flatulence.   sodium phosphate (FLEET) 7-19 GM/118ML ENEM Place 1 enema rectally every other day as needed for severe constipation.   spironolactone (ALDACTONE) 25 MG tablet Take 1 tablet (25 mg total) by mouth daily.   sulfamethoxazole-trimethoprim (BACTRIM DS) 800-160 MG tablet Take 1 tablet by mouth 2 (two) times daily. For 10 days   tiZANidine (ZANAFLEX) 2 MG tablet Take 1 tablet (2 mg total) by mouth 2 (two) times daily.   triamcinolone cream (KENALOG) 0.1 % Apply to thigh and lower legs topically every 8 hours as needed. Apply to back of neck topically as  needed.   No facility-administered encounter medications on file as of 03/15/2022.    Review of Systems  Immunization History  Administered Date(s) Administered   Influenza, High Dose Seasonal PF 11/17/2021   Influenza-Unspecified 11/14/2018, 11/20/2019, 11/17/2020   Moderna Covid-19 Vaccine Bivalent Booster 94yr & up 06/30/2021, 12/11/2021   Moderna Sars-Covid-2 Vaccination 02/09/2019, 03/09/2019, 12/14/2019, 06/20/2020, 10/24/2020   PPD Test 05/16/2019   Tdap 11/29/2017   Pertinent  Health Maintenance Due  Topic Date Due   INFLUENZA VACCINE  Completed   DEXA SCAN  Discontinued      08/18/2021   12:00 PM 08/18/2021    8:00 PM 08/19/2021   10:00 AM 08/19/2021    8:54 PM 08/20/2021    8:20 AM  Fall Risk  (RETIRED) Patient Fall Risk Level High fall risk High fall risk High fall risk High fall risk High fall risk   Functional Status Survey:  There were no vitals filed for this visit. There is no height or weight on file to calculate BMI. Physical Exam Vitals reviewed.  Cardiovascular:     Rate and Rhythm: Normal rate.     Pulses: Normal pulses.  Pulmonary:     Effort: Pulmonary effort is normal.  Abdominal:     General: Abdomen is flat. Bowel sounds are normal.     Palpations: Abdomen is soft.  Skin:    General: Skin is warm and dry.  Neurological:     Mental Status: She is alert and oriented to person, place, and time.     Labs reviewed: Recent Labs    08/18/21 0423 08/19/21 0333 08/20/21 0443 09/24/21 0000 02/08/22 0000  NA 124* 130* 130* 137 137  K 4.6 5.1 4.5 4.7 4.8  CL 95* 102 101 101 98*  CO2 23 23 26 $ 30* 33*  GLUCOSE 117* 117* 101*  --   --   BUN 29* 23 15 11 10  $ CREATININE 0.96 0.72 0.54 0.5 0.5  CALCIUM 7.1* 7.4* 7.1* 8.5* 9.0   Recent Labs    09/24/21 0000 02/08/22 0000  AST  --  18  ALT  --  14  ALKPHOS  --  65  ALBUMIN 3.2* 3.7   Recent Labs    08/15/21 1035 08/16/21 1333 08/18/21 0423 08/18/21 1759 08/19/21 0333  08/20/21 0443 08/20/21 1029 11/26/21 0000 02/08/22 0000 02/25/22 0000  WBC 7.4   < > 13.0*  --  10.6* 8.9  --  6.3 5.5 5.1  NEUTROABS 5.2  --   --   --   --   --   --   --  3,036.00 2,800.00  HGB 12.9   < > 7.7*   < > 8.9* 7.8*   < > 10.5* 11.7* 10.4*  HCT 41.3   < > 22.7*   < > 27.5* 23.7*  --  32* 35* 32*  MCV 89.8   < > 87.6  --  88.4 87.1  --   --   --   --   PLT 335   < > 180  --  198 216  --  346 321 318   < > = values in this interval not displayed.   No results found for: "TSH" No results found for: "HGBA1C" No results found for: "CHOL", "HDL", "LDLCALC", "LDLDIRECT", "TRIG", "CHOLHDL"  Significant Diagnostic Results in last 30 days:  No results found.  Assessment/Plan Neurogenic bowel  Constipation due to slow transit  Deep vein thrombosis (DVT) of proximal vein of both lower extremities, unspecified chronicity (HCC)  Benign essential HTN  Hypertensive heart disease with other congestive heart failure ([place])  Central cord syndrome at C6 level of cervical spinal cord, sequela (HCC)  Quadriplegia (HCC)  Neurogenic bladder  Chronic indwelling Foley catheter Patient desires replacement of foley catheter for comfort measures. Agree with replacing so she can participate in routine activities. Continue bowel regimen. X-rays have been normal, bowel movements regular per nursing. BP well-controlled. Continue Cymbalta for pain and mood. Continue fosamax for osteoporosis. Continue Xarelto given hx of DVT. Continue gabapentin for pain control. Continue zyrtec PRN. Continue xanaflex for spastic paralysis. Continue spironolactone for BP. Continue PRN simethicone. Continue PRN fleet enema, miralax, and senna.    Family/ staff Communication: nursing  Labs/tests ordered:  none

## 2022-03-17 ENCOUNTER — Ambulatory Visit: Payer: Medicare HMO

## 2022-03-17 ENCOUNTER — Ambulatory Visit: Payer: Medicare HMO | Admitting: Occupational Therapy

## 2022-03-18 ENCOUNTER — Encounter: Payer: Self-pay | Admitting: Student

## 2022-03-18 NOTE — Telephone Encounter (Signed)
Skilled resident at [facility], all request and message are handled through the facility staff.  Message forwarded to Dewayne Shorter, MD

## 2022-03-22 ENCOUNTER — Ambulatory Visit: Payer: Medicare HMO | Attending: Internal Medicine | Admitting: Occupational Therapy

## 2022-03-22 ENCOUNTER — Ambulatory Visit: Payer: Medicare HMO

## 2022-03-22 DIAGNOSIS — R278 Other lack of coordination: Secondary | ICD-10-CM

## 2022-03-22 DIAGNOSIS — R2681 Unsteadiness on feet: Secondary | ICD-10-CM | POA: Diagnosis present

## 2022-03-22 DIAGNOSIS — M6281 Muscle weakness (generalized): Secondary | ICD-10-CM

## 2022-03-22 DIAGNOSIS — S14129S Central cord syndrome at unspecified level of cervical spinal cord, sequela: Secondary | ICD-10-CM | POA: Diagnosis present

## 2022-03-22 DIAGNOSIS — R262 Difficulty in walking, not elsewhere classified: Secondary | ICD-10-CM

## 2022-03-22 DIAGNOSIS — S72001A Fracture of unspecified part of neck of right femur, initial encounter for closed fracture: Secondary | ICD-10-CM | POA: Insufficient documentation

## 2022-03-22 DIAGNOSIS — S72002A Fracture of unspecified part of neck of left femur, initial encounter for closed fracture: Secondary | ICD-10-CM | POA: Insufficient documentation

## 2022-03-22 DIAGNOSIS — R269 Unspecified abnormalities of gait and mobility: Secondary | ICD-10-CM | POA: Diagnosis present

## 2022-03-22 DIAGNOSIS — R2689 Other abnormalities of gait and mobility: Secondary | ICD-10-CM

## 2022-03-22 NOTE — Therapy (Signed)
Occupational Therapy Treatment Note    Patient Name: Sherry [name] MRN: [id number] DOB:[birth year], 88 y.o., female Today's Date: 03/22/2022                                         PCP: Dr. Viviana Simpler REFERRING PROVIDER: Dr. Viviana Simpler   OT End of Session - 03/22/22 1225     Visit Number 35    Number of Visits 18    Date for OT Re-Evaluation 04/28/22    Authorization Time Period Progress report period starting 12/14/2021    OT Start Time 78    OT Stop Time 1145    OT Time Calculation (min) 45 min    Activity Tolerance Patient tolerated treatment well    Behavior During Therapy [facility] for tasks assessed/performed                 Past Medical History:  Diagnosis Date   Acute blood loss anemia    Arthritis    Cancer ([place])    skin   Central cord syndrome at C6 level of cervical spinal cord ([place]) 11/29/2017   Hypertension    Protein-calorie malnutrition, severe ([place]) 01/24/2018   S/P insertion of IVC (inferior vena caval) filter 01/24/2018   Tetraparesis ([place])    Past Surgical History:  Procedure Laterality Date   ANTERIOR CERVICAL DECOMP/DISCECTOMY FUSION N/A 11/29/2017   Procedure: Cervical five-six, six-seven Anterior Cervical Decompression Fusion;  Surgeon: Judith Part, MD;  Location: [facility];  Service: Neurosurgery;  Laterality: N/A;  Cervical five-six, six-seven Anterior Cervical Decompression Fusion   CATARACT EXTRACTION     FEMUR IM NAIL Left 08/16/2021   Procedure: INTRAMEDULLARY (IM) NAIL FEMORAL;  Surgeon: Earnestine Leys, MD;  Location: [facility];  Service: Orthopedics;  Laterality: Left;   IR IVC FILTER PLMT / S&I /IMG GUID/MOD SED  12/08/2017   Patient Active Problem List   Diagnosis Date Noted   Hypertensive heart disease with other congestive heart failure ([place]) 02/14/2022   Chronic indwelling Foley catheter 02/14/2022   Closed hip fracture ([place]) 08/15/2021   DVT (deep venous thrombosis) ([place]) 08/15/2021   Quadriplegia ([place]) 08/15/2021    Fall 08/15/2021   Constipation due to slow transit 08/31/2018   Trauma 06/05/2018   Neuropathic pain 06/05/2018   Neurogenic bowel 06/05/2018   Vaginal yeast infection 01/30/2018   Healthcare-associated pneumonia 01/25/2018   Chronic allergic rhinitis 01/24/2018   Depression with anxiety 01/24/2018   UTI due to Klebsiella species 01/24/2018   Protein-calorie malnutrition, severe ([place]) 01/24/2018   S/P insertion of IVC (inferior vena caval) filter 01/24/2018   Tetraparesis ([place]) 01/20/2018   Neurogenic bladder 01/20/2018   Reactive depression    Benign essential HTN    Acute postoperative anemia due to expected blood loss    Central cord syndrome at C6 level of cervical spinal cord ([place]) 11/29/2017   Allergy to alpha-gal 11/25/2016   SCC (squamous cell carcinoma) 04/08/2014    REFERRING DIAG: Central Cord Syndrome at C6 of the Cervical Spinal Cord, Fall with Bilateral Closed Hip Fractures, with ORIF repair with Intramedullary Nailing of the Left Hip Fracture.  THERAPY DIAG:  Muscle weakness (generalized)  Rationale for Evaluation and Treatment Rehabilitation  PERTINENT HISTORY: Patient s/p fall November 29, 2017 resulting in diagnosis of central cord syndrome at C 6 level.  She has had therapy in multiple venues but with recent move to [facility]  [place], therapy staff recommended she seek outpatient therapy for her needs. Pt. Had a recent fall with Bilateral Closed Hip Fractures, with ORIF repair with Intramedullary Nailing of the Left Hip Fracture.   PRECAUTIONS: None  SUBJECTIVE:   Pt. reports missing her independence without her power chair  PAIN:  Are you having pain? No     OBJECTIVE:   TODAY'S TREATMENT:   Manual Therapy:   Pt. tolerated soft tissue massage to the volar, and dorsal surface of each digit on the bilateral hands secondary to stiffness following moist heat modality. Manual therapy was performed independent of, and in preparation for therapeutic Ex.    Therapeutic Ex.    Pt. tolerated PROM followed by AROM bilateral wrist extension, PROM for bilateral digit MP, PIP, and DIP flexion, and extension, thumb radial, and palmar abduction. Pt. Worked on BUE strengthening, and reciprocal motion using the UBE while seated for 4 min. with minimal resistance. Constant monitoring was provided. There. Ex. was performed following moist heat, and manual therapy.    Pt. arrived in a manual w/c today reporting that she has not received her joystick yet. Pt. Will be having a newer w/c arrive tomorrow. Pt. Has been sick with a UTI over the past few weeks, and reports that she now has finally starting to feel better. Pt. continues to make progress with AROM in right shoulder flexion, and bilateral UE strength, and continues to try to engage her bilateral hands more during daily ADL, and IADL tasks. Pt. continues to tolerate There. Ex. stretches and manual therapy well today. Pt. continues to present with tightness at the bilateral thumb webspace, and bilateral wrist flexor tightness, as well as digit MP, PIP, and DIP extensor tightness which continues to limit her ability to complete ADL tasks efficiently. Pt. continues to benefit from working on impoving ROM in order to work towards increasing bilateral hand grasp on objects, and increase engagement of her bilateral hands during ADLs, and IADL tasks.   PATIENT EDUCATION: Education details: BUE ROM, hand function, UE functioning Person educated: Patient Education method: Explanation and Demonstration Education comprehension: verbalized understanding, returned demonstration, and needs further education   HOME EXERCISE PROGRAM Continue with ongoing HEPs  Measurements:  12/09/2021:  Shoulder flexion: R: 120(136), L: 138(150) Shoulder abduction: R: 108(120), L: 110(120) Elbow: R: 0-148, L: 0-146 Wrist flexion: R: 55(62), L: 78 Writ extension: R: 34(55), L: 3(4) RD: R: 12(20), L: 16(26) UD: R: 8(24), L:  6(18) Thumb radial abduction: R: 11(20), L: 8(20) Digit flexion to the [facility]: R:  2nd: 5cm(3.5cm), 3rd: 7.5 cm(5cm), 4th: 7cm(3cm), 5th: 6cm(5.5cm) L: 2nd: 8cm(8cm), 3rd: 8cm(8cm), 4th: 7.5cm(7cm), 5th: 7cm(7cm)  02/03/2022:   Shoulder flexion: R: 122(138), L: 148(154) Shoulder abduction: R: 109(125), L: 125(125 Elbow: R: 0-148, L: 0-146 Wrist flexion: R: 60(68), L: 79 Writ extension: R: 40(55), L: 3(10) RD: R: 14(24), L: 20(28) UD: R: 8(24), L: 6(18) Thumb radial abduction: R: 20(25), L: 8(20) Digit flexion to the [facility]: R:  2nd: 4.5cm(3cm), 3rd: 6.5cm(4.5cm), 4th: 6.5 cm(3cm), 5th: 4.5cm(5cm) L: 2nd: 8cm(8cm), 3rd: 8cm(7cm), 4th: 7.5cm(7cm), 5th: 6.5cm(6.5cm)            OT Long Term Goals - 07/06/21 1106  Target Date: 04/28/2022    OT LONG TERM GOAL #1   Title Patient and caregiver will demonstrate understanding of home exercise program for ROM.    Baseline 12/09/2021: Pt. Reports the restorative ROM program has not resumed at the facility. 10/28/2021: Pt. Is attempting exercises/stretches at home. The facility restorative aides have not resumed exercises. 09/14/2021:  Pt. Reports inconsistencies with restorative ROM at  her care home. Pt/caregiver education to be provided about ongoing ROM needs.    Time 12    Period Weeks    Status Deferred     OT LONG TERM GOAL #2   Title Pt.  will improve BUE strength to be able to sustain her UE's in elevation to efficiently, and effectively perform hair care, and self-grooming tasks.   Baseline 02/03/2022: Pt. has improved, and is now able to sustain her Bilateral UEs in elevation long enough to efficiently, and effectively perform haircare, and self-grooming tasks. 12/09/2021: right shoulder 4-/5, left shoulder 4/5. Pt. has improved to be able to reach up for hair care, and self-grooming tasks. 10/28/2021: Right shoulder 3+/5, Left shoulder 4-/5 09/14/2021:   BUE shoulder strength: 3+/5 for shoulder flexion, and abduction.   Time 12    Period Weeks    Status Achieved               OT LONG TERM GOAL #3   Title Patient will demonstrate improved composite finger flexion to hold deodorant to apply to underarms.    Baseline 02/03/2022: Pt. Has improved with digit flexion, however continues to have difficulty securely holding and using deoderant. 12/09/2021: Bilateral hand/digit MP, PIP, and DIP extension tightness limits her ability achieve digit flexion to hold, and apply deodorant. 10/28/2021:  Pt. Continues to have difficulty holding the deodorant. 01/12/2021: Pt. Presents with limited digit extension. Pt. Is able to initiate holding deodorant, however is unable to hold it while using it.   Time 12    Period Weeks    Status Ongoing   Target Date                  OT LONG TERM GOAL #4   Title Pt. Will improve bilateral wrist extension in preparation for anticipating, and initiating reaching for objects at the table.    Baseline 02/03/2022: Right: 34(55) Left: 3(4) 12/09/2021: Right  34(55)  Left  3(4) 10/28/2021:  Right 22(38), Left 0(15) 09/14/2021:  Right: 17(35), left 2(15)   Time 12   Period weeks   Status Ongoing   Target Date      OT LONG TERM GOAL #5   Title Pt. will write, and sign her name with 100% legibility, and modified independence.    Baseline 02/03/2022: Pt. Continues to report legibility appears "shaky" when attempting signing her name. Pt.reports that she is writing more when filling out her menu, and complete puzzles.12/09/2021: Pt. Reports legibility appears "shaky" when attempting signing her name. Pt.reports that she is writing more when filling out her menu. 10/28/2021: pt. is able to sign her name, however legibility continues to be inconsistent. Pt. Reports "shaky appearance". 09/14/2021: Pt. continues to fill out her daily menu and complete puzzles. Pt. Continues to present with limited writing legibility.   Time 12    Period  Weeks    Status  Ongoing         OT LONG TERM GOAL  #6   TITLE Pt. will turn pages in a book with modified independence.    Baseline 02/03/2022: Pt. Is able to  effectively turn pages of a book. 10/28/2021: Pt. Is able to consistently turn book pages. 09/14/2021: Pt. has difficulty turning pages.    Time 12    Period Weeks    Status Achieved       OT LONG TERM GOAL  #7  TITLE Pt. Will increase  bilateral lateral pinch strength by 2 lbs to be able to securely grasp items during ADLs, and IADL tasks.   Baseline 02/03/2022: Right: 6#, Left: 4# 12/09/2021:  Right: 6#, Left: 4#   Time 12  Period Weeks  Status Ongoing           OT LONG TERM GOAL  #8  TITLE Pt. Will button a sweater with large 1" buttons, and zip her jacket with modified independence.   Baseline 02/03/2022: Pt. is unable to manipulate buttons on her sweater, or zippers on her jacket.  Time 12  Period Weeks  Status  New   OT LONG TERM GOAL  #9  TITLE Pt. will complete plant care with modified independence.  Baseline 02/03/2022: Pt. has difficulty caring for her plants.   Time 12  Period Weeks  Status New   OT LONG TERM GOAL  #10  TITLE Pt. will demonstrate adaptive techniques to assist with the efficiency of self-dressing, or morning care tasks.  Baseline 02/03/2022: Pt. Requires assist from caregivers 2/2 time limitations during morning care.   Time 12  Period Weeks  Status New     Plan - 07/22/21 1737     Clinical Impression Statement Pt. arrived in a manual w/c today reporting that she has not received her joystick yet. Pt. Will be having a newer w/c arrive tomorrow. Pt. Has been sick with a UTI over the past few weeks, and reports that she now has finally starting to feel better. Pt. continues to make progress with AROM in right shoulder flexion, and bilateral UE strength, and continues to try to engage her bilateral hands more during daily ADL, and IADL tasks. Pt. continues to tolerate There. Ex. stretches and  manual therapy well today. Pt. continues to present with tightness at the bilateral thumb webspace, and bilateral wrist flexor tightness, as well as digit MP, PIP, and DIP extensor tightness which continues to limit her ability to complete ADL tasks efficiently. Pt. continues to benefit from working on impoving ROM in order to work towards increasing bilateral hand grasp on objects, and increase engagement of her bilateral hands during ADLs, and IADL tasks.    .                     OT Occupational Profile and History Detailed Assessment- Review of Records and additional review of physical, cognitive, psychosocial history related to current functional performance    Occupational performance deficits (Please refer to evaluation for details): ADL's;IADL's;Leisure;Social Participation    Body Structure / Function / Physical Skills ADL;Continence;Dexterity;Flexibility;Strength;ROM;Balance;Coordination;FMC;IADL;Endurance;UE functional use;Decreased knowledge of use of DME;GMC    Psychosocial Skills Coping Strategies    Rehab Potential Fair    Clinical Decision Making Several treatment options, min-mod task modification necessary    Comorbidities Affecting Occupational Performance: Presence of comorbidities impacting occupational performance    Comorbidities impacting occupational performance description: contractures of bilateral hands, dependent transfers,    Modification or Assistance to Complete Evaluation  No modification of tasks or assist necessary to complete eval    OT Frequency 2x / week    OT Duration 12 weeks    OT Treatment/Interventions Self-care/ADL training;Cryotherapy;Therapeutic exercise;DME and/or AE  instruction;Balance training;Neuromuscular education;Manual Therapy;Splinting;Moist Heat;Passive range of motion;Therapeutic activities;Patient/family education    Plan continue to progress ROM of digits, wrists and shoulders as it pertains to completion of ADL tasks that pt values.     Consulted and Agree with Plan of Care Patient            Harrel Carina, MS, OTR/L  Harrel Carina, OT 03/22/2022, 12:28 PM      `

## 2022-03-23 NOTE — Therapy (Signed)
OUTPATIENT PHYSICAL THERAPY NEURO TREATMENT   Patient Name: Sherry [name] MRN: [id number] DOB:12-18-1934, 88 y.o., female Today's Date: 03/23/2022   PCP: Dewayne Shorter, MD REFERRING PROVIDER: Earnestine Leys   PT End of Session - 03/22/22 1202     Visit Number 29    Number of Visits 43    Date for PT Re-Evaluation 04/05/22    Authorization Time Period Initial cert [id number]; Recert [id number]    Progress Note Due on Visit 30    PT Start Time 1153    PT Stop Time 1223    PT Time Calculation (min) 30 min    Equipment Utilized During Treatment Gait belt    Activity Tolerance Patient tolerated treatment well    Behavior During Therapy WFL for tasks assessed/performed                          Past Medical History:  Diagnosis Date   Acute blood loss anemia    Arthritis    Cancer ([place])    skin   Central cord syndrome at C6 level of cervical spinal cord ([place]) 11/29/2017   Hypertension    Protein-calorie malnutrition, severe ([place]) 01/24/2018   S/P insertion of IVC (inferior vena caval) filter 01/24/2018   Tetraparesis ([place])    Past Surgical History:  Procedure Laterality Date   ANTERIOR CERVICAL DECOMP/DISCECTOMY FUSION N/A 11/29/2017   Procedure: Cervical five-six, six-seven Anterior Cervical Decompression Fusion;  Surgeon: Judith Part, MD;  Location: [facility];  Service: Neurosurgery;  Laterality: N/A;  Cervical five-six, six-seven Anterior Cervical Decompression Fusion   CATARACT EXTRACTION     FEMUR IM NAIL Left 08/16/2021   Procedure: INTRAMEDULLARY (IM) NAIL FEMORAL;  Surgeon: Earnestine Leys, MD;  Location: [facility];  Service: Orthopedics;  Laterality: Left;   IR IVC FILTER PLMT / S&I /IMG GUID/MOD SED  12/08/2017   Patient Active Problem List   Diagnosis Date Noted   Hypertensive heart disease with other congestive heart failure ([place]) 02/14/2022   Chronic indwelling Foley catheter 02/14/2022   Closed hip fracture ([place])  08/15/2021   DVT (deep venous thrombosis) ([place]) 08/15/2021   Quadriplegia ([place]) 08/15/2021   Fall 08/15/2021   Constipation due to slow transit 08/31/2018   Trauma 06/05/2018   Neuropathic pain 06/05/2018   Neurogenic bowel 06/05/2018   Vaginal yeast infection 01/30/2018   Healthcare-associated pneumonia 01/25/2018   Chronic allergic rhinitis 01/24/2018   Depression with anxiety 01/24/2018   UTI due to Klebsiella species 01/24/2018   Protein-calorie malnutrition, severe ([place]) 01/24/2018   S/P insertion of IVC (inferior vena caval) filter 01/24/2018   Tetraparesis ([place]) 01/20/2018   Neurogenic bladder 01/20/2018   Reactive depression    Benign essential HTN    Acute postoperative anemia due to expected blood loss    Central cord syndrome at C6 level of cervical spinal cord ([place]) 11/29/2017   Allergy to alpha-gal 11/25/2016   SCC (squamous cell carcinoma) 04/08/2014    ONSET DATE: 08/15/2021 (fall with B hip fx); Initial injury was in 2019- quadraplegia due to central cord syndrom of C6 secondary to neck fracture.   REFERRING DIAG: Bilateral Hip fx; Left Open- s/p Left IM nail femoral on 08/16/2021; Right closed  THERAPY DIAG:  Muscle weakness (generalized)  Other lack of coordination  Abnormality of gait and mobility  Difficulty in walking, not elsewhere classified  Other abnormalities of gait and mobility  Closed fracture of both hips, initial encounter (HCC)  Unsteadiness on feet  Central cord syndrome, sequela ([place])  Rationale for Evaluation and Treatment Rehabilitation  SUBJECTIVE:                                                                                                                                                                                              SUBJECTIVE STATEMENT:    Patient reports she has been sick for past couple of weeks and feeling really weak. States she is still waiting on the joystick to be repaired and presents in manual w/c  today.      Pt accompanied by:  Paid caregiver-   PERTINENT HISTORY: Patient was previous patient (see previous episode for specifics) She experienced a fall at home on 08/15/2021 and was in [facility] from 08/15/2021- 08/20/2021.  Most recent history per Dr. Laurey Arrow on 08/20/2021.  Roslyn A Selix is a 88 y.o. 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   Patient has history of quadriplegia, and has been doing PT/OT with improvement of function. Pt has some use of both arms and can feed herself. She can walk a little bit with assistance. Pt accidentally fell at about 930 this morning after taking shower.  She injured her hips. Hospital Course:    Closed left hip fracture [facility]) s/p mechanical fall Closed right hip fracture (?old) Acute blood loss anemia --pt is s/p  INTRAMEDULLARY (IM) NAIL FEMORAL-left on 7/16  PAIN:  Are you having pain? NO: NPRS scale: 0/10 Pain location: Right hip Pain description: Ache Aggravating factors: any active Movement of right side Relieving factors: Rest  PRECAUTIONS: Fall  WEIGHT BEARING RESTRICTIONS Yes Bilateral non-weight bearing B LE's  FALLS: Has patient fallen in last 6 months? Yes. Number of falls 1  LIVING ENVIRONMENT: Lives with: lives with their family and lives in a nursing home Lives in: Other Long term care at [place]: No Has following equipment at home: Wheelchair (power), Ramped entry, and hoyer lift  PLOF: Needs assistance with ADLs, Needs assistance with homemaking, Needs assistance with gait, Needs assistance with transfers, and Leisure: Work puzzles, read, Watch TV  PATIENT GOALS Patient goal is to walk; Be able to strengthen my arms for better use  OBJECTIVE:   DIAGNOSTIC FINDINGS: Overall  cognitive statusEXAM: DG HIP (WITH OR WITHOUT PELVIS) 2-3V LEFT; DG HIP (WITH OR WITHOUT PELVIS) 2-3V RIGHT   COMPARISON:  CT of the abdomen and pelvis 01/21/2020   FINDINGS: Left proximal femur  fracture noted just below the intertrochanteric region. Marked ventral angulation noted on the cross-table image. Slight varus angulation present   Comminuted intratrochanteric fracture present on the right also with marked ventral angulation.   IMPRESSION: 1. Bilateral hip fractures. 2. Comminuted intertrochanteric fracture of the right hip. 3. Angulated fracture of the proximal left femur just below the intertrochanteric region.     Electronically Signed   By: San Morelle M.D.   On: 08/15/2021 11:54:   COGNITION:  Within functional limits for tasks assessed   SENSATION: Light touch: Impaired   EDEMA:  None  Strength R/L 2-/2- Shoulder flexion (anterior deltoid/pec major/coracobrachialis, axillary n. (C5-6) and musculocutaneous n. (C5-7)) 2-/3+ (within available ROM around 130 on lef)Shoulder abduction (deltoid/supraspinatus, axillary/suprascapular n, C5) 3+/3+ Shoulder external rotation (infraspinatus/teres minor) 4/4 Shoulder internal rotation (subcapularis/lats/pec major) 3+/3+ Shoulder extension (posterior deltoid, lats, teres major, axillary/thoracodorsal n.) 3+/3+ Shoulder horizontal abduction 4/4 Elbow flexion (biceps brachii, brachialis, brachioradialis, musculoskeletal n, C5-6) 4/4 Elbow extension (triceps, radial n, C7) 2-/2- Wrist Extension 3+/3+ Wrist Flexion   LOWER EXTREMITY MMT:    MMT Right 01/11/22 Left 01/11/22  Hip flexion 2- 2-  Hip extension 2- 2-  Hip abduction 2- 2-  Hip adduction 2- 3  Hip internal rotation 2- 2-  Hip external rotation 2- 2-  Knee flexion 2- 2-  Knee extension 2+ 3+  Ankle dorsiflexion 2- NT - boot  Ankle plantarflexion 2- NT- boot  Ankle inversion 2- NT- boot  Ankle eversion 2- NT- boot  (Blank rows = not  tested)  BED MOBILITY:  Unable to test due to NWB status of BLE- Patient currently dependent with all bed mobility using hoyer and caregiver assist  TRANSFERS: Currently BLE NWB- Dependent on use of hoyer for all transfers  FUNCTIONAL TESTs:   PATIENT SURVEYS:  FOTO 12  TODAY'S TREATMENT:         THERE.EX:    AAROM- B hip flex 2 sets x 15 reps    AAROM- R LAQ - 2 sets x 15 reps (patient able to achieve approx 45 deg from flexed knee position of 90 deg)   AROM- Left LAQ- 2 sets of 15 reps   Seated LAQ with AAROM on right LE - 2 sets of 15 reps   Seated hip adduction squeeze on red ball - hold 5 sec - 2 sets of 10 reps   Straight leg hip abd with PT holding heel for support - 2 sets of 15 reps each LE   Seated gluteal sets - 2 set of 10 reps (hold 5 sec)    AROM B knee flexion x 15 reps each LE.    AROM DF/PF x 15 reps (left foot as much as possible in brace)     PATIENT EDUCATION: Education details: PT plan of care/use of resistive bands Person educated: Patient Education method: Explanation, Demonstration, Tactile cues, and Verbal cues Education comprehension: verbalized understanding, returned demonstration, verbal cues required, tactile cues required, and needs further education   HOME EXERCISE PROGRAM: Discussed use of theraband and demo shoulder flex/abd and elbow flex and ext with use of yellow TB. Patient required assist to position therband but able to perform well with only one person assist  Access Code: [facility] URL: [email or web address] Date: 11/23/2021 Prepared by: Janna Arch Exercises - Seated Gluteal Sets - 2 x daily - 7 x weekly - 2 sets - 10 reps - 5 hold - Seated Quad Set - 2 x daily - 7 x weekly - 2 sets -  10 reps - 5 hold - Seated Long Arc Quad - 2 x daily - 7 x weekly - 2 sets - 10 reps - 5 hold - Seated March - 2 x daily - 7 x weekly - 2 sets - 10 reps - 5 hold - Seated Hip Abduction - 2 x daily - 7 x weekly - 2 sets - 10 reps - 5  hold - Seated Shoulder Shrugs - 2 x daily - 7 x weekly - 2 sets - 10 reps - 5 hold - Wheelchair Pressure Relief - 2 x daily - 7 x weekly - 2 sets - 10 reps - 5 hold  GOALS: Goals reviewed with patient? Yes  SHORT TERM GOALS: Target date: 02/22/2022  Pt will be independent with initial UE strengthening HEP in order to improve strength and balance in order to decrease fall risk and improve function at home and work. Baseline: 10/19/2021- patient with no formal UE HEP; 12/02/2021= Patient verbalized knowledge of HEP including use of theraband for UE strengthening.  Goal status: GOAL MET  LONG TERM GOALS: Target date: 04/05/2022  Pt will be independent with final for UE/LE HEP in order to improve strength and balance in order to decrease fall risk and improve function at home and work. Baseline: Patient is currently BLE NWB and unable to participate in HEP. Has order for UE strengthening. 01/11/2022- Patient now able to participate in LE strengthening although NWB still- good understanding for some basic exercises. Will keep goal active to incorporate progressive LE strengthening exercises. Goal status: Progressing  2.  Pt will improve FOTO to target score of 35  to display perceived improvements in ability to complete ADL's.  Baseline: 10/19/2021= 12; 12/02/2021= 17; 01/11/2022=17 Goal status: Ongoing  3.  Pt will increase strength of B UE  by at least 1/2 MMT grade in order to demonstrate improvement in strength and function  Baseline: patient has range of 2-/5 to 4/5 BUE Strength; 12/02/2021= 4/5 except with wrist ext. 01/11/2022= 4/5 B UE strength expept for wrist Goal status: Partially met  ASSESSMENT:  CLINICAL IMPRESSION:  Patient returns to PT after being sick last week with UTI. She presents with overall weakness and standing was deferred. She was able to participate well overall with seated LE strengthening- responsive to VC and requiring some physical assist to achieve more ROM. Pt will  continue to benefit from skilled PT services to improve strength in anticipation for BLE clearance for [place] activities.    OBJECTIVE IMPAIRMENTS Abnormal gait, decreased activity tolerance, decreased balance, decreased coordination, decreased endurance, decreased mobility, difficulty walking, decreased ROM, decreased strength, hypomobility, impaired flexibility, impaired UE functional use, postural dysfunction, and pain.   ACTIVITY LIMITATIONS carrying, lifting, bending, sitting, standing, squatting, sleeping, stairs, transfers, bed mobility, continence, bathing, toileting, dressing, self feeding, reach over head, hygiene/grooming, and caring for others  PARTICIPATION LIMITATIONS: meal prep, cleaning, laundry, medication management, personal finances, interpersonal relationship, driving, shopping, community activity, and yard work  PERSONAL FACTORS Age, Time since onset of injury/illness/exacerbation, and 1-2 comorbidities: HTN, cervical Sx  are also affecting patient's functional outcome.   REHAB POTENTIAL: Good  CLINICAL DECISION MAKING: Evolving/moderate complexity  EVALUATION COMPLEXITY: Moderate  PLAN: PT FREQUENCY: 1-2x/week  PT DURATION: 12 weeks  PLANNED INTERVENTIONS: Therapeutic exercises, Therapeutic activity, Neuromuscular re-education, Balance training, Gait training, Patient/Family education, Self Care, Joint mobilization, DME instructions, Dry Needling, Electrical stimulation, Wheelchair mobility training, Spinal mobilization, Cryotherapy, Moist heat, and Manual therapy  PLAN FOR NEXT SESSION: Continue with weight bearing activities  and LE strengthening as appropriate.  Ollen Bowl, PT Physical Therapist- [facility]  03/23/2022, 9:04 AM

## 2022-03-24 ENCOUNTER — Ambulatory Visit: Payer: Medicare HMO

## 2022-03-24 ENCOUNTER — Ambulatory Visit: Payer: Medicare HMO | Admitting: Occupational Therapy

## 2022-03-24 DIAGNOSIS — R278 Other lack of coordination: Secondary | ICD-10-CM

## 2022-03-24 DIAGNOSIS — R269 Unspecified abnormalities of gait and mobility: Secondary | ICD-10-CM

## 2022-03-24 DIAGNOSIS — R262 Difficulty in walking, not elsewhere classified: Secondary | ICD-10-CM

## 2022-03-24 DIAGNOSIS — M6281 Muscle weakness (generalized): Secondary | ICD-10-CM

## 2022-03-24 DIAGNOSIS — R2689 Other abnormalities of gait and mobility: Secondary | ICD-10-CM

## 2022-03-24 DIAGNOSIS — S72001A Fracture of unspecified part of neck of right femur, initial encounter for closed fracture: Secondary | ICD-10-CM

## 2022-03-24 DIAGNOSIS — R2681 Unsteadiness on feet: Secondary | ICD-10-CM

## 2022-03-24 NOTE — Therapy (Signed)
OUTPATIENT PHYSICAL THERAPY NEURO TREATMENT/Physical Therapy Progress Note   Dates of reporting period  01/14/2023   to   03/24/2022   Patient Name: Sherry [name] MRN: [id number] DOB:1934-10-25, 88 y.o., female Today's Date: 03/25/2022   PCP: Dewayne Shorter, MD REFERRING PROVIDER: Earnestine Leys   PT End of Session - 03/24/22 1137     Visit Number 30    Number of Visits 43    Date for PT Re-Evaluation 04/05/22    Authorization Time Period Initial cert [id number]; Recert [id number]    Progress Note Due on Visit 30    PT Start Time 1147    PT Stop Time 1221    PT Time Calculation (min) 34 min    Equipment Utilized During Treatment Gait belt    Activity Tolerance Patient tolerated treatment well    Behavior During Therapy WFL for tasks assessed/performed                          Past Medical History:  Diagnosis Date   Acute blood loss anemia    Arthritis    Cancer ([place])    skin   Central cord syndrome at C6 level of cervical spinal cord ([place]) 11/29/2017   Hypertension    Protein-calorie malnutrition, severe ([place]) 01/24/2018   S/P insertion of IVC (inferior vena caval) filter 01/24/2018   Tetraparesis ([place])    Past Surgical History:  Procedure Laterality Date   ANTERIOR CERVICAL DECOMP/DISCECTOMY FUSION N/A 11/29/2017   Procedure: Cervical five-six, six-seven Anterior Cervical Decompression Fusion;  Surgeon: Judith Part, MD;  Location: [facility];  Service: Neurosurgery;  Laterality: N/A;  Cervical five-six, six-seven Anterior Cervical Decompression Fusion   CATARACT EXTRACTION     FEMUR IM NAIL Left 08/16/2021   Procedure: INTRAMEDULLARY (IM) NAIL FEMORAL;  Surgeon: Earnestine Leys, MD;  Location: [facility];  Service: Orthopedics;  Laterality: Left;   IR IVC FILTER PLMT / S&I /IMG GUID/MOD SED  12/08/2017   Patient Active Problem List   Diagnosis Date Noted   Hypertensive heart disease with other congestive heart failure ([place])  02/14/2022   Chronic indwelling Foley catheter 02/14/2022   Closed hip fracture ([place]) 08/15/2021   DVT (deep venous thrombosis) ([place]) 08/15/2021   Quadriplegia ([place]) 08/15/2021   Fall 08/15/2021   Constipation due to slow transit 08/31/2018   Trauma 06/05/2018   Neuropathic pain 06/05/2018   Neurogenic bowel 06/05/2018   Vaginal yeast infection 01/30/2018   Healthcare-associated pneumonia 01/25/2018   Chronic allergic rhinitis 01/24/2018   Depression with anxiety 01/24/2018   UTI due to Klebsiella species 01/24/2018   Protein-calorie malnutrition, severe ([place]) 01/24/2018   S/P insertion of IVC (inferior vena caval) filter 01/24/2018   Tetraparesis ([place]) 01/20/2018   Neurogenic bladder 01/20/2018   Reactive depression    Benign essential HTN    Acute postoperative anemia due to expected blood loss    Central cord syndrome at C6 level of cervical spinal cord ([place]) 11/29/2017   Allergy to alpha-gal 11/25/2016   SCC (squamous cell carcinoma) 04/08/2014    ONSET DATE: 08/15/2021 (fall with B hip fx); Initial injury was in 2019- quadraplegia due to central cord syndrom of C6 secondary to neck fracture.   REFERRING DIAG: Bilateral Hip fx; Left Open- s/p Left IM nail femoral on 08/16/2021; Right closed  THERAPY DIAG:  Muscle weakness (generalized)  Other lack of coordination  Abnormality of gait and mobility  Difficulty in walking, not elsewhere classified  Other abnormalities of  gait and mobility  Closed fracture of both hips, initial encounter ([place])  Unsteadiness on feet  Rationale for Evaluation and Treatment Rehabilitation  SUBJECTIVE:                                                                                                                                                                                              SUBJECTIVE STATEMENT:    Patient reports feeling some better but still recovering. Presents with loaner power W/C today.     Pt accompanied by:  Paid  caregiver-   PERTINENT HISTORY: Patient was previous patient (see previous episode for specifics) She experienced a fall at home on 08/15/2021 and was in [facility] from 08/15/2021- 08/20/2021.  Most recent history per Dr. Laurey Arrow on 08/20/2021.  Sherry [name] is a 88 y.o. 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   Patient has history of quadriplegia, and has been doing PT/OT with improvement of function. Pt has some use of both arms and can feed herself. She can walk a little bit with assistance. Pt accidentally fell at about 930 this morning after taking shower.  She injured her hips. Hospital Course:    Closed left hip fracture [facility]) s/p mechanical fall Closed right hip fracture (?old) Acute blood loss anemia --pt is s/p  INTRAMEDULLARY (IM) NAIL FEMORAL-left on 7/16  PAIN:  Are you having pain? NO: NPRS scale: 0/10 Pain location: Right hip Pain description: Ache Aggravating factors: any active Movement of right side Relieving factors: Rest  PRECAUTIONS: Fall  WEIGHT BEARING RESTRICTIONS Yes Bilateral non-weight bearing B LE's  FALLS: Has patient fallen in last 6 months? Yes. Number of falls 1  LIVING ENVIRONMENT: Lives with: lives with their family and lives in a nursing home Lives in: Other Long term care at [place]: No Has following equipment at home: Wheelchair (power), Ramped entry, and hoyer lift  PLOF: Needs assistance with ADLs, Needs assistance with homemaking, Needs assistance with gait, Needs assistance with transfers, and Leisure: Work puzzles, read, Watch TV  PATIENT GOALS Patient goal is to walk; Be able to strengthen my arms for better use  OBJECTIVE:   DIAGNOSTIC FINDINGS: Overall cognitive statusEXAM: DG HIP (WITH OR  WITHOUT PELVIS) 2-3V LEFT; DG HIP (WITH OR WITHOUT PELVIS) 2-3V RIGHT   COMPARISON:  CT of the abdomen and pelvis 01/21/2020   FINDINGS: Left proximal femur fracture noted just below the intertrochanteric region. Marked  ventral angulation noted on the cross-table image. Slight varus angulation present   Comminuted intratrochanteric fracture present on the right also with marked ventral angulation.   IMPRESSION: 1. Bilateral hip fractures. 2. Comminuted intertrochanteric fracture of the right hip. 3. Angulated fracture of the proximal left femur just below the intertrochanteric region.     Electronically Signed   By: San Morelle M.D.   On: 08/15/2021 11:54:   COGNITION:  Within functional limits for tasks assessed   SENSATION: Light touch: Impaired   EDEMA:  None  Strength R/L 2-/2- Shoulder flexion (anterior deltoid/pec major/coracobrachialis, axillary n. (C5-6) and musculocutaneous n. (C5-7)) 2-/3+ (within available ROM around 130 on lef)Shoulder abduction (deltoid/supraspinatus, axillary/suprascapular n, C5) 3+/3+ Shoulder external rotation (infraspinatus/teres minor) 4/4 Shoulder internal rotation (subcapularis/lats/pec major) 3+/3+ Shoulder extension (posterior deltoid, lats, teres major, axillary/thoracodorsal n.) 3+/3+ Shoulder horizontal abduction 4/4 Elbow flexion (biceps brachii, brachialis, brachioradialis, musculoskeletal n, C5-6) 4/4 Elbow extension (triceps, radial n, C7) 2-/2- Wrist Extension 3+/3+ Wrist Flexion   LOWER EXTREMITY MMT:    MMT Right 01/11/22 Left 01/11/22  Hip flexion 2- 2-  Hip extension 2- 2-  Hip abduction 2- 2-  Hip adduction 2- 3  Hip internal rotation 2- 2-  Hip external rotation 2- 2-  Knee flexion 2- 2-  Knee extension 2+ 3+  Ankle dorsiflexion 2- NT - boot  Ankle plantarflexion 2- NT- boot  Ankle inversion 2- NT- boot  Ankle eversion 2- NT- boot  (Blank rows = not tested)  BED MOBILITY:  Unable to test  due to NWB status of BLE- Patient currently dependent with all bed mobility using hoyer and caregiver assist  TRANSFERS: Currently BLE NWB- Dependent on use of hoyer for all transfers  FUNCTIONAL TESTs:   PATIENT SURVEYS:  FOTO 12  TODAY'S TREATMENT:  ADJUSTED GOALS and added transfer/standing goals MMT = continues to be 2-/5 throughout Left and Right LE except left knee ext= 3+/5  THERE.EX:    AAROM- B hip flex 2 sets x 15 reps    AAROM- R LAQ - 2 sets x 15 reps (patient able to achieve approx 75 deg from flexed knee position of 90 deg)   AROM- Left LAQ- 2 sets of 15 reps   Seated LAQ with AAROM on right LE - 2 sets of 15 reps   Seated hip adduction squeeze on red ball - hold 5 sec - 2 sets of 10 reps   Straight leg hip abd with PT holding heel for support - 2 sets of 15 reps each LE   AROM B knee flexion x 15 reps each LE.    AROM DF/PF x 15 reps (left foot as much as possible in brace)     PATIENT EDUCATION: Education details: PT plan of care/use of resistive bands Person educated: Patient Education method: Explanation, Demonstration, Tactile cues, and Verbal cues Education comprehension: verbalized understanding, returned demonstration, verbal cues required, tactile cues required, and needs further education   HOME EXERCISE PROGRAM: Discussed use of theraband and demo shoulder flex/abd and elbow flex and ext with use of yellow TB. Patient required assist to position therband but able to perform well with only one person assist  Access Code: [facility] URL: [email or web address] Date: 11/23/2021 Prepared by: Janna Arch Exercises - Seated Gluteal Sets - 2 x daily - 7 x weekly - 2 sets - 10 reps - 5 hold - Seated Quad Set - 2 x daily - 7 x weekly - 2 sets - 10 reps - 5 hold -  Seated Long Arc Quad - 2 x daily - 7 x weekly - 2 sets - 10 reps - 5 hold - Seated March - 2 x daily - 7 x weekly - 2 sets - 10 reps - 5 hold - Seated Hip Abduction - 2 x daily - 7 x weekly -  2 sets - 10 reps - 5 hold - Seated Shoulder Shrugs - 2 x daily - 7 x weekly - 2 sets - 10 reps - 5 hold - Wheelchair Pressure Relief - 2 x daily - 7 x weekly - 2 sets - 10 reps - 5 hold  GOALS: Goals reviewed with patient? Yes  SHORT TERM GOALS: Target date: 02/22/2022  Pt will be independent with initial UE strengthening HEP in order to improve strength and balance in order to decrease fall risk and improve function at home and work. Baseline: 10/19/2021- patient with no formal UE HEP; 12/02/2021= Patient verbalized knowledge of HEP including use of theraband for UE strengthening.  Goal status: GOAL MET  LONG TERM GOALS: Target date: 04/05/2022  Pt will be independent with final for UE/LE HEP in order to improve strength and balance in order to decrease fall risk and improve function at home and work. Baseline: Patient is currently BLE NWB and unable to participate in HEP. Has order for UE strengthening. 01/11/2022- Patient now able to participate in LE strengthening although NWB still- good understanding for some basic exercises. Will keep goal active to incorporate progressive LE strengthening exercises. Goal status: Progressing  2.  Pt will improve FOTO to target score of 35  to display perceived improvements in ability to complete ADL's.  Baseline: 10/19/2021= 12; 12/02/2021= 17; 01/11/2022=17 Goal status: Ongoing  3.  Pt will increase strength of B UE  by at least 1/2 MMT grade in order to demonstrate improvement in strength and function  Baseline: patient has range of 2-/5 to 4/5 BUE Strength; 12/02/2021= 4/5 except with wrist ext. 01/11/2022= 4/5 B UE strength expept for wrist Goal status: Partially met  4. Pt. Will increase strength of RLE by at least 1/2 MMT grade in order to demonstrate improvement in Standing/transfers.  Baseline: 2-/5 Right hip flex/knee ext/flex  Goal status: Initial  5. Pt. Will demo ability to stand pivot transfer with max assist for improved functional mobility  and less dependent need on mechanical device.   Baseline: Dependent on hoyer lift for all transfers  Goal status: Initial  6. Pt. Will demonstrate improved functional LE strength as seen by ability to stand > 2 min for improved transfer ability and pregait abilities.   Baseline: not assessed today due to recent dx: UTI  Goal status: initial  ASSESSMENT:  CLINICAL IMPRESSION:  Patient continues to recover from recent bout of UTI.  Unable to test her standing but now that she is weight bearing as tolerated - goals were added to reflect change in focus and continued work to progress LE strengthening. She continues to work hard and achieving almost full knee ext right ROM. Patient's condition has the potential to improve in response to therapy. Maximum improvement is yet to be obtained. The anticipated improvement is attainable and reasonable in a generally predictable time. Pt will continue to benefit from skilled PT services to improve strength in anticipation for BLE clearance for [place] activities.    OBJECTIVE IMPAIRMENTS Abnormal gait, decreased activity tolerance, decreased balance, decreased coordination, decreased endurance, decreased mobility, difficulty walking, decreased ROM, decreased strength, hypomobility, impaired flexibility, impaired UE functional use, postural dysfunction,  and pain.   ACTIVITY LIMITATIONS carrying, lifting, bending, sitting, standing, squatting, sleeping, stairs, transfers, bed mobility, continence, bathing, toileting, dressing, self feeding, reach over head, hygiene/grooming, and caring for others  PARTICIPATION LIMITATIONS: meal prep, cleaning, laundry, medication management, personal finances, interpersonal relationship, driving, shopping, community activity, and yard work  PERSONAL FACTORS Age, Time since onset of injury/illness/exacerbation, and 1-2 comorbidities: HTN, cervical Sx  are also affecting patient's functional outcome.   REHAB POTENTIAL:  Good  CLINICAL DECISION MAKING: Evolving/moderate complexity  EVALUATION COMPLEXITY: Moderate  PLAN: PT FREQUENCY: 1-2x/week  PT DURATION: 12 weeks  PLANNED INTERVENTIONS: Therapeutic exercises, Therapeutic activity, Neuromuscular re-education, Balance training, Gait training, Patient/Family education, Self Care, Joint mobilization, DME instructions, Dry Needling, Electrical stimulation, Wheelchair mobility training, Spinal mobilization, Cryotherapy, Moist heat, and Manual therapy  PLAN FOR NEXT SESSION: Continue with weight bearing activities and LE strengthening as appropriate.  Ollen Bowl, PT Physical Therapist- [facility]  03/25/2022, 1:20 PM

## 2022-03-24 NOTE — Therapy (Signed)
Occupational Therapy Treatment Note    Patient Name: Sherry [name] MRN: [id number] DOB:[birth year], 88 y.o., female Today's Date: 03/24/2022                                         PCP: Dr. Viviana Simpler REFERRING PROVIDER: Dr. Viviana Simpler   OT End of Session - 03/24/22 1440     Visit Number [id number]    Number of Visits 13    Date for OT Re-Evaluation 04/28/22    Authorization Time Period Progress report period starting 12/14/2021    OT Start Time 57    OT Stop Time 1145    OT Time Calculation (min) 45 min    Equipment Utilized During Treatment moist heat    Activity Tolerance Patient tolerated treatment well    Behavior During Therapy WFL for tasks assessed/performed                 Past Medical History:  Diagnosis Date   Acute blood loss anemia    Arthritis    Cancer ([place])    skin   Central cord syndrome at C6 level of cervical spinal cord ([place]) 11/29/2017   Hypertension    Protein-calorie malnutrition, severe ([employer]) 01/24/2018   S/P insertion of IVC (inferior vena caval) filter 01/24/2018   Tetraparesis ([place])    Past Surgical History:  Procedure Laterality Date   ANTERIOR CERVICAL DECOMP/DISCECTOMY FUSION N/A 11/29/2017   Procedure: Cervical five-six, six-seven Anterior Cervical Decompression Fusion;  Surgeon: Judith Part, MD;  Location: [place];  Service: Neurosurgery;  Laterality: N/A;  Cervical five-six, six-seven Anterior Cervical Decompression Fusion   CATARACT EXTRACTION     FEMUR IM NAIL Left 08/16/2021   Procedure: INTRAMEDULLARY (IM) NAIL FEMORAL;  Surgeon: Earnestine Leys, MD;  Location: [facility];  Service: Orthopedics;  Laterality: Left;   IR IVC FILTER PLMT / S&I /IMG GUID/MOD SED  12/08/2017   Patient Active Problem List   Diagnosis Date Noted   Hypertensive heart disease with other congestive heart failure ([place]) 02/14/2022   Chronic indwelling Foley catheter 02/14/2022   Closed hip fracture ([place]) 08/15/2021   DVT (deep venous thrombosis)  ([place]) 08/15/2021   Quadriplegia ([place]) 08/15/2021   Fall 08/15/2021   Constipation due to slow transit 08/31/2018   Trauma 06/05/2018   Neuropathic pain 06/05/2018   Neurogenic bowel 06/05/2018   Vaginal yeast infection 01/30/2018   Healthcare-associated pneumonia 01/25/2018   Chronic allergic rhinitis 01/24/2018   Depression with anxiety 01/24/2018   UTI due to Klebsiella species 01/24/2018   Protein-calorie malnutrition, severe ([place]) 01/24/2018   S/P insertion of IVC (inferior vena caval) filter 01/24/2018   Tetraparesis ([place]) 01/20/2018   Neurogenic bladder 01/20/2018   Reactive depression    Benign essential HTN    Acute postoperative anemia due to expected blood loss    Central cord syndrome at C6 level of cervical spinal cord ([place]) 11/29/2017   Allergy to alpha-gal 11/25/2016   SCC (squamous cell carcinoma) 04/08/2014    REFERRING DIAG: Central Cord Syndrome at C6 of the Cervical Spinal Cord, Fall with Bilateral Closed Hip Fractures, with ORIF repair with Intramedullary Nailing of the Left Hip Fracture.  THERAPY DIAG:  Muscle weakness (generalized)  Rationale for Evaluation and Treatment Rehabilitation  PERTINENT HISTORY: Patient s/p fall November 29, 2017 resulting in diagnosis of central cord syndrome at C 6 level.  She has had therapy  in multiple venues but with recent move to [facility], therapy staff recommended she seek outpatient therapy for her needs. Pt. Had a recent fall with Bilateral Closed Hip Fractures, with ORIF repair with Intramedullary Nailing of the Left Hip Fracture.   PRECAUTIONS: None  SUBJECTIVE:   Pt. reports that the loaner chair is very uncomfortable to achieve the upright midline sitting position.  PAIN:  Are you having pain? No     OBJECTIVE:   TODAY'S TREATMENT:   Manual Therapy:   Pt. tolerated soft tissue massage to the volar, and dorsal surface of each digit on the bilateral hands secondary to stiffness following moist heat  modality. Manual therapy was performed independent of, and in preparation for therapeutic Ex.   Therapeutic Ex.    Pt. tolerated PROM followed by AROM bilateral wrist extension, PROM for bilateral digit MP, PIP, and DIP flexion, and extension, thumb radial, and palmar abduction.Pt. worked on bilateral shoulder flexion, chest press, and elbow flexion/extension, as well as adding a combination of movements. Pt. Worked on BUE strengthening, and reciprocal motion using the UBE while seated for 8 min. with minimal resistance. Constant monitoring was provided. There. Ex. was performed following moist heat, and manual therapy.    Pt. arrived in a loaner electric chair, which Pt. reports moves way too fast, and does allow a proper position. Pt. Reports discomfort with it through the back of the seat when trying to assume upright midline sitting. Pt. continues to make progress with AROM in right shoulder flexion, and bilateral UE strength, and continues to try to engage her bilateral hands more during daily ADL, and IADL tasks. Pt. continues to tolerate Ther. Ex. stretches and manual therapy well today. Pt. continues to present with tightness at the bilateral thumb webspace, and bilateral wrist flexor tightness, as well as digit MP, PIP, and DIP extensor tightness which continues to limit her ability to complete ADL tasks efficiently. Pt. continues to benefit from working on impoving ROM in order to work towards increasing bilateral hand grasp on objects, and increase engagement of her bilateral hands during ADLs, and IADL tasks.   PATIENT EDUCATION:  Education details: BUE ROM, hand function, UE functioning Person educated: Patient Education method: Explanation and Demonstration Education comprehension: verbalized understanding, returned demonstration, and needs further education   HOME EXERCISE PROGRAM Continue with ongoing HEPs  Measurements:  12/09/2021:  Shoulder flexion: R: 120(136), L:  138(150) Shoulder abduction: R: 108(120), L: 110(120) Elbow: R: 0-148, L: 0-146 Wrist flexion: R: 55(62), L: 78 Writ extension: R: 34(55), L: 3(4) RD: R: 12(20), L: 16(26) UD: R: 8(24), L: 6(18) Thumb radial abduction: R: 11(20), L: 8(20) Digit flexion to the [facility]: R:  2nd: 5cm(3.5cm), 3rd: 7.5 cm(5cm), 4th: 7cm(3cm), 5th: 6cm(5.5cm) L: 2nd: 8cm(8cm), 3rd: 8cm(8cm), 4th: 7.5cm(7cm), 5th: 7cm(7cm)  02/03/2022:   Shoulder flexion: R: 122(138), L: 148(154) Shoulder abduction: R: 109(125), L: 125(125 Elbow: R: 0-148, L: 0-146 Wrist flexion: R: 60(68), L: 79 Writ extension: R: 40(55), L: 3(10) RD: R: 14(24), L: 20(28) UD: R: 8(24), L: 6(18) Thumb radial abduction: R: 20(25), L: 8(20) Digit flexion to the [facility]: R:  2nd: 4.5cm(3cm), 3rd: 6.5cm(4.5cm), 4th: 6.5 cm(3cm), 5th: 4.5cm(5cm) L: 2nd: 8cm(8cm), 3rd: 8cm(7cm), 4th: 7.5cm(7cm), 5th: 6.5cm(6.5cm)            OT Long Term Goals - 07/06/21 1106  Target Date: 04/28/2022    OT LONG TERM GOAL #1   Title Patient and caregiver will demonstrate understanding of home exercise program for ROM.    Baseline 12/09/2021: Pt. Reports the restorative ROM program has not resumed at the facility. 10/28/2021: Pt. Is attempting exercises/stretches at home. The facility restorative aides have not resumed exercises. 09/14/2021:  Pt. Reports inconsistencies with restorative ROM at  her care home. Pt/caregiver education to be provided about ongoing ROM needs.    Time 12    Period Weeks    Status Deferred     OT LONG TERM GOAL #2   Title Pt.  will improve BUE strength to be able to sustain her UE's in elevation to efficiently, and effectively perform hair care, and self-grooming tasks.   Baseline 02/03/2022: Pt. has improved, and is now able to sustain her Bilateral UEs in elevation long enough to efficiently, and effectively perform haircare, and self-grooming tasks.  12/09/2021: right shoulder 4-/5, left shoulder 4/5. Pt. has improved to be able to reach up for hair care, and self-grooming tasks. 10/28/2021: Right shoulder 3+/5, Left shoulder 4-/5 09/14/2021:  BUE shoulder strength: 3+/5 for shoulder flexion, and abduction.   Time 12    Period Weeks    Status Achieved               OT LONG TERM GOAL #3   Title Patient will demonstrate improved composite finger flexion to hold deodorant to apply to underarms.    Baseline 02/03/2022: Pt. Has improved with digit flexion, however continues to have difficulty securely holding and using deoderant. 12/09/2021: Bilateral hand/digit MP, PIP, and DIP extension tightness limits her ability achieve digit flexion to hold, and apply deodorant. 10/28/2021:  Pt. Continues to have difficulty holding the deodorant. 01/12/2021: Pt. Presents with limited digit extension. Pt. Is able to initiate holding deodorant, however is unable to hold it while using it.   Time 12    Period Weeks    Status Ongoing   Target Date                  OT LONG TERM GOAL #4   Title Pt. Will improve bilateral wrist extension in preparation for anticipating, and initiating reaching for objects at the table.    Baseline 02/03/2022: Right: 34(55) Left: 3(4) 12/09/2021: Right  34(55)  Left  3(4) 10/28/2021:  Right 22(38), Left 0(15) 09/14/2021:  Right: 17(35), left 2(15)   Time 12   Period weeks   Status Ongoing   Target Date      OT LONG TERM GOAL #5   Title Pt. will write, and sign her name with 100% legibility, and modified independence.    Baseline 02/03/2022: Pt. Continues to report legibility appears "shaky" when attempting signing her name. Pt.reports that she is writing more when filling out her menu, and complete puzzles.12/09/2021: Pt. Reports legibility appears "shaky" when attempting signing her name. Pt.reports that she is writing more when filling out her menu. 10/28/2021: pt. is able to sign her name, however legibility continues to be  inconsistent. Pt. Reports "shaky appearance". 09/14/2021: Pt. continues to fill out her daily menu and complete puzzles. Pt. Continues to present with limited writing legibility.   Time 12    Period Weeks    Status  Ongoing         OT LONG TERM GOAL  #6   TITLE Pt. will turn pages in a book with modified independence.    Baseline 02/03/2022: Pt. Is able to  effectively turn pages of a book. 10/28/2021: Pt. Is able to consistently turn book pages. 09/14/2021: Pt. has difficulty turning pages.    Time 12    Period Weeks    Status Achieved       OT LONG TERM GOAL  #7  TITLE Pt. Will increase  bilateral lateral pinch strength by 2 lbs to be able to securely grasp items during ADLs, and IADL tasks.   Baseline 02/03/2022: Right: 6#, Left: 4# 12/09/2021:  Right: 6#, Left: 4#   Time 12  Period Weeks  Status Ongoing           OT LONG TERM GOAL  #8  TITLE Pt. Will button a sweater with large 1" buttons, and zip her jacket with modified independence.   Baseline 02/03/2022: Pt. is unable to manipulate buttons on her sweater, or zippers on her jacket.  Time 12  Period Weeks  Status  New   OT LONG TERM GOAL  #9  TITLE Pt. will complete plant care with modified independence.  Baseline 02/03/2022: Pt. has difficulty caring for her plants.   Time 12  Period Weeks  Status New   OT LONG TERM GOAL  #10  TITLE Pt. will demonstrate adaptive techniques to assist with the efficiency of self-dressing, or morning care tasks.  Baseline 02/03/2022: Pt. Requires assist from caregivers 2/2 time limitations during morning care.   Time 12  Period Weeks  Status New     Plan - 07/22/21 1737     Clinical Impression Statement Pt. arrived in a loaner electric chair, which Pt. reports moves way too fast, and does allow a proper position. Pt. Reports discomfort with it through the back of the seat when trying to assume upright midline sitting. Pt. continues to make progress with AROM in right shoulder flexion, and  bilateral UE strength, and continues to try to engage her bilateral hands more during daily ADL, and IADL tasks. Pt. continues to tolerate Ther. Ex. stretches and manual therapy well today. Pt. continues to present with tightness at the bilateral thumb webspace, and bilateral wrist flexor tightness, as well as digit MP, PIP, and DIP extensor tightness which continues to limit her ability to complete ADL tasks efficiently. Pt. continues to benefit from working on impoving ROM in order to work towards increasing bilateral hand grasp on objects, and increase engagement of her bilateral hands during ADLs, and IADL tasks.     .                     OT Occupational Profile and History Detailed Assessment- Review of Records and additional review of physical, cognitive, psychosocial history related to current functional performance    Occupational performance deficits (Please refer to evaluation for details): ADL's;IADL's;Leisure;Social Participation    Body Structure / Function / Physical Skills ADL;Continence;Dexterity;Flexibility;Strength;ROM;Balance;Coordination;FMC;IADL;Endurance;UE functional use;Decreased knowledge of use of DME;[facility]    Psychosocial Skills Coping Strategies    Rehab Potential Fair    Clinical Decision Making Several treatment options, min-mod task modification necessary    Comorbidities Affecting Occupational Performance: Presence of comorbidities impacting occupational performance    Comorbidities impacting occupational performance description: contractures of bilateral hands, dependent transfers,    Modification or Assistance to Complete Evaluation  No modification of tasks or assist necessary to complete eval    OT Frequency 2x / week    OT Duration 12 weeks    OT Treatment/Interventions Self-care/ADL training;Cryotherapy;Therapeutic exercise;DME and/or AE instruction;Balance training;Neuromuscular education;Manual Therapy;Splinting;Moist Heat;Passive range of  motion;Therapeutic  activities;Patient/family education    Plan continue to progress ROM of digits, wrists and shoulders as it pertains to completion of ADL tasks that pt values.    Consulted and Agree with Plan of Care Patient            Harrel Carina, MS, OTR/L  Harrel Carina, OT 03/24/2022, 2:42 PM      `

## 2022-03-25 ENCOUNTER — Ambulatory Visit (INDEPENDENT_AMBULATORY_CARE_PROVIDER_SITE_OTHER): Payer: Medicare HMO | Admitting: Dermatology

## 2022-03-25 ENCOUNTER — Encounter: Payer: Self-pay | Admitting: Dermatology

## 2022-03-25 VITALS — BP 114/65 | HR 103

## 2022-03-25 DIAGNOSIS — L578 Other skin changes due to chronic exposure to nonionizing radiation: Secondary | ICD-10-CM | POA: Diagnosis not present

## 2022-03-25 DIAGNOSIS — C4492 Squamous cell carcinoma of skin, unspecified: Secondary | ICD-10-CM

## 2022-03-25 DIAGNOSIS — C44622 Squamous cell carcinoma of skin of right upper limb, including shoulder: Secondary | ICD-10-CM | POA: Diagnosis not present

## 2022-03-25 NOTE — Progress Notes (Signed)
   New Patient Visit  Subjective  Sherry [name] is a 88 y.o. female who presents for the following: Skin Cancer (Referral for evaluation and treatment of SCC/KA-like pattern Bx 07/28/2021).  Caretaker with patient.   Objective  Well appearing patient in no apparent distress; mood and affect are within normal limits.  A focused examination was performed including right dorsal hand. Relevant physical exam findings are noted in the Assessment and Plan.  Right Hand - Posterior Pink scar tissue      Assessment & Plan      Squamous cell carcinoma of skin Right Hand - Posterior  Destruction of lesion  Destruction method: electrodesiccation and curettage   Informed consent: discussed and consent obtained   Timeout:  patient name, date of birth, surgical site, and procedure verified Anesthesia: the lesion was anesthetized in a standard fashion   Anesthetic:  1% lidocaine w/ epinephrine 1-100,000 buffered w/ 8.4% NaHCO3 Curettage performed in three different directions: Yes   Electrodesiccation performed over the curetted area: Yes   Curettage cycles:  3 Final wound size (cm):  1.1 Hemostasis achieved with:  electrodesiccation Outcome: patient tolerated procedure well with no complications   Post-procedure details: sterile dressing applied and wound care instructions given   Dressing: gel foam and pressure dressing applied today.    Reviewed records from outside provider.  SCC biopsied last summer, not treated previously.   Given appearance today, discussed option of ED&C, topical chemotherapy or observation as there is no visible sign of residual SCC more than 6 months after her biopsy and KAC type SCCs can regress.  She prefers ED&C today.  Continue skin check at [facility]; Recommend f/u in 1-2 months.   In case of bleeding, recommend holding firm pressure for 10 minutes without removing pressure. If there is still bleeding, recommend OTC BleedStop Powder available  at [employer]. This can be sprinkled onto a clean wound and will help stop bleeding.   For any future skin cancers, request that she or her daughter call us or send a MyChart message to request an appointment so we can get her in promptly for treatment.   Actinic Damage - chronic, secondary to cumulative UV radiation exposure/sun exposure over time - diffuse scaly erythematous macules with underlying dyspigmentation - Recommend daily broad spectrum sunscreen SPF 30+ to sun-exposed areas, reapply every 2 hours as needed.  - Recommend staying in the shade or wearing long sleeves, sun glasses (UVA+UVB protection) and wide brim hats (4-inch brim around the entire circumference of the hat). - Call for new or changing lesions.  Return if symptoms worsen or fail to improve.   I, Emelia Salisbury, CMA, am acting as scribe for Forest Gleason, MD.  Documentation: I have reviewed the above documentation for accuracy and completeness, and I agree with the above.  Forest Gleason, MD

## 2022-03-25 NOTE — Patient Instructions (Signed)
Wound Care Instructions  Cleanse wound gently with soap and water once a day then pat dry with clean gauze. Apply a thin coat of Petrolatum (petroleum jelly, "Vaseline") over the wound (unless you have an allergy to this). We recommend that you use a new, sterile tube of Vaseline. Do not pick or remove scabs. Do not remove the yellow or white "healing tissue" from the base of the wound.  Cover the wound with fresh, clean, nonstick gauze and secure with paper tape. You may use Band-Aids in place of gauze and tape if the wound is small enough, but would recommend trimming much of the tape off as there is often too much. Sometimes Band-Aids can irritate the skin.  You should call the office for your biopsy report after 1 week if you have not already been contacted.  If you experience any problems, such as abnormal amounts of bleeding, swelling, significant bruising, significant pain, or evidence of infection, please call the office immediately.  FOR ADULT SURGERY PATIENTS: If you need something for pain relief you may take 1 extra strength Tylenol (acetaminophen) AND 2 Ibuprofen (200mg each) together every 4 hours as needed for pain. (do not take these if you are allergic to them or if you have a reason you should not take them.) Typically, you may only need pain medication for 1 to 3 days.     Due to recent changes in healthcare laws, you may see results of your pathology and/or laboratory studies on MyChart before the doctors have had a chance to review them. We understand that in some cases there may be results that are confusing or concerning to you. Please understand that not all results are received at the same time and often the doctors may need to interpret multiple results in order to provide you with the best plan of care or course of treatment. Therefore, we ask that you please give us 2 business days to thoroughly review all your results before contacting the office for clarification. Should  we see a critical lab result, you will be contacted sooner.   If You Need Anything After Your Visit  If you have any questions or concerns for your doctor, please call our main line at [phone] and press option 4 to reach your doctor's medical assistant. If no one answers, please leave a voicemail as directed and we will return your call as soon as possible. Messages left after 4 pm will be answered the following business day.   You may also send us a message via MyChart. We typically respond to MyChart messages within 1-2 business days.  For prescription refills, please ask your pharmacy to contact our office. Our fax number is [phone].  If you have an urgent issue when the clinic is closed that cannot wait until the next business day, you can page your doctor at the number below.    Please note that while we do our best to be available for urgent issues outside of office hours, we are not available 24/7.   If you have an urgent issue and are unable to reach us, you may choose to seek medical care at your doctor's office, retail clinic, urgent care center, or emergency room.  If you have a medical emergency, please immediately call 911 or go to the emergency department.  Pager Numbers  - Dr. Kowalski: [phone]  - Dr. Moye: [phone]  - Dr. Stewart: [phone]  In the event of inclement weather, please call our main line at   [phone] for an update on the status of any delays or closures.  Dermatology Medication Tips: Please keep the boxes that topical medications come in in order to help keep track of the instructions about where and how to use these. Pharmacies typically print the medication instructions only on the boxes and not directly on the medication tubes.   If your medication is too expensive, please contact our office at [phone] option 4 or send us a message through MyChart.   We are unable to tell what your co-pay for medications will be in  advance as this is different depending on your insurance coverage. However, we may be able to find a substitute medication at lower cost or fill out paperwork to get insurance to cover a needed medication.   If a prior authorization is required to get your medication covered by your insurance company, please allow us 1-2 business days to complete this process.  Drug prices often vary depending on where the prescription is filled and some pharmacies may offer cheaper prices.  The website www.goodrx.com contains coupons for medications through different pharmacies. The prices here do not account for what the cost may be with help from insurance (it may be cheaper with your insurance), but the website can give you the price if you did not use any insurance.  - You can print the associated coupon and take it with your prescription to the pharmacy.  - You may also stop by our office during regular business hours and pick up a GoodRx coupon card.  - If you need your prescription sent electronically to a different pharmacy, notify our office through [employer] MyChart or by phone at [phone] option 4.     Si Usted Necesita Algo Despus de Su Visita  Tambin puede enviarnos un mensaje a travs de MyChart. Por lo general respondemos a los mensajes de MyChart en el transcurso de 1 a 2 das hbiles.  Para renovar recetas, por favor pida a su farmacia que se ponga en contacto con nuestra oficina. Nuestro nmero de fax es el [phone].  Si tiene un asunto urgente cuando la clnica est cerrada y que no puede esperar hasta el siguiente da hbil, puede llamar/localizar a su doctor(a) al nmero que aparece a continuacin.   Por favor, tenga en cuenta que aunque hacemos todo lo posible para estar disponibles para asuntos urgentes fuera del horario de oficina, no estamos disponibles las 24 horas del da, los 7 das de la semana.   Si tiene un problema urgente y no puede comunicarse con nosotros, puede  optar por buscar atencin mdica  en el consultorio de su doctor(a), en una clnica privada, en un centro de atencin urgente o en una sala de emergencias.  Si tiene una emergencia mdica, por favor llame inmediatamente al 911 o vaya a la sala de emergencias.  Nmeros de bper  - Dr. Kowalski: [phone]  - Dra. Moye: [phone]  - Dra. Stewart: [phone]  En caso de inclemencias del tiempo, por favor llame a nuestra lnea principal al [phone] para una actualizacin sobre el estado de cualquier retraso o cierre.  Consejos para la medicacin en dermatologa: Por favor, guarde las cajas en las que vienen los medicamentos de uso tpico para ayudarle a seguir las instrucciones sobre dnde y cmo usarlos. Las farmacias generalmente imprimen las instrucciones del medicamento slo en las cajas y no directamente en los tubos del medicamento.   Si su medicamento es muy caro, por favor, pngase en contacto con   nuestra oficina llamando al [phone] y presione la opcin 4 o envenos un mensaje a travs de MyChart.   No podemos decirle cul ser su copago por los medicamentos por adelantado ya que esto es diferente dependiendo de la cobertura de su seguro. Sin embargo, es posible que podamos encontrar un medicamento sustituto a menor costo o llenar un formulario para que el seguro cubra el medicamento que se considera necesario.   Si se requiere una autorizacin previa para que su compaa de seguros cubra su medicamento, por favor permtanos de 1 a 2 das hbiles para completar este proceso.  Los precios de los medicamentos varan con frecuencia dependiendo del lugar de dnde se surte la receta y alguna farmacias pueden ofrecer precios ms baratos.  El sitio web www.goodrx.com tiene cupones para medicamentos de diferentes farmacias. Los precios aqu no tienen en cuenta lo que podra costar con la ayuda del seguro (puede ser ms barato con su seguro), pero el sitio web puede darle el  precio si no utiliz ningn seguro.  - Puede imprimir el cupn correspondiente y llevarlo con su receta a la farmacia.  - Tambin puede pasar por nuestra oficina durante el horario de atencin regular y recoger una tarjeta de cupones de GoodRx.  - Si necesita que su receta se enve electrnicamente a una farmacia diferente, informe a nuestra oficina a travs de MyChart de [place] o por telfono llamando al [phone] y presione la opcin 4.  

## 2022-03-26 ENCOUNTER — Encounter: Payer: Self-pay | Admitting: Dermatology

## 2022-03-29 ENCOUNTER — Encounter: Payer: Self-pay | Admitting: Adult Health

## 2022-03-29 ENCOUNTER — Ambulatory Visit: Payer: Medicare HMO

## 2022-03-29 ENCOUNTER — Non-Acute Institutional Stay (SKILLED_NURSING_FACILITY): Payer: Medicare HMO | Admitting: Adult Health

## 2022-03-29 ENCOUNTER — Ambulatory Visit: Payer: Medicare HMO | Admitting: Occupational Therapy

## 2022-03-29 DIAGNOSIS — R269 Unspecified abnormalities of gait and mobility: Secondary | ICD-10-CM

## 2022-03-29 DIAGNOSIS — M6281 Muscle weakness (generalized): Secondary | ICD-10-CM

## 2022-03-29 DIAGNOSIS — N319 Neuromuscular dysfunction of bladder, unspecified: Secondary | ICD-10-CM

## 2022-03-29 DIAGNOSIS — R2681 Unsteadiness on feet: Secondary | ICD-10-CM

## 2022-03-29 DIAGNOSIS — R262 Difficulty in walking, not elsewhere classified: Secondary | ICD-10-CM

## 2022-03-29 DIAGNOSIS — R278 Other lack of coordination: Secondary | ICD-10-CM

## 2022-03-29 DIAGNOSIS — T148XXA Other injury of unspecified body region, initial encounter: Secondary | ICD-10-CM | POA: Diagnosis not present

## 2022-03-29 DIAGNOSIS — R2689 Other abnormalities of gait and mobility: Secondary | ICD-10-CM

## 2022-03-29 DIAGNOSIS — S72001A Fracture of unspecified part of neck of right femur, initial encounter for closed fracture: Secondary | ICD-10-CM

## 2022-03-29 NOTE — Therapy (Signed)
Occupational Therapy Treatment Note    Patient Name: Sherry [name] MRN: [id number] DOB:07-Oct-1934, 88 y.o., female Today's Date: 03/29/2022                                         PCP: Dr. Viviana Simpler REFERRING PROVIDER: Dr. Viviana Simpler   OT End of Session - 03/29/22 1656     Visit Number 37    Number of Visits 18    Date for OT Re-Evaluation 04/28/22    Authorization Time Period Progress report period starting 12/14/2021    OT Start Time 1106    OT Stop Time 1145    OT Time Calculation (min) 39 min    Activity Tolerance Patient tolerated treatment well    Behavior During Therapy [facility] for tasks assessed/performed                 Past Medical History:  Diagnosis Date   Acute blood loss anemia    Arthritis    Cancer ([place])    skin   Central cord syndrome at C6 level of cervical spinal cord ([place]) 11/29/2017   Hypertension    Protein-calorie malnutrition, severe ([place]) 01/24/2018   S/P insertion of IVC (inferior vena caval) filter 01/24/2018   Tetraparesis ([place])    Past Surgical History:  Procedure Laterality Date   ANTERIOR CERVICAL DECOMP/DISCECTOMY FUSION N/A 11/29/2017   Procedure: Cervical five-six, six-seven Anterior Cervical Decompression Fusion;  Surgeon: Judith Part, MD;  Location: [facility];  Service: Neurosurgery;  Laterality: N/A;  Cervical five-six, six-seven Anterior Cervical Decompression Fusion   CATARACT EXTRACTION     FEMUR IM NAIL Left 08/16/2021   Procedure: INTRAMEDULLARY (IM) NAIL FEMORAL;  Surgeon: Earnestine Leys, MD;  Location: [facility];  Service: Orthopedics;  Laterality: Left;   IR IVC FILTER PLMT / S&I /IMG GUID/MOD SED  12/08/2017   Patient Active Problem List   Diagnosis Date Noted   Hypertensive heart disease with other congestive heart failure ([place]) 02/14/2022   Chronic indwelling Foley catheter 02/14/2022   Closed hip fracture ([place]) 08/15/2021   DVT (deep venous thrombosis) ([place]) 08/15/2021   Quadriplegia ([place]) 08/15/2021    Fall 08/15/2021   Constipation due to slow transit 08/31/2018   Trauma 06/05/2018   Neuropathic pain 06/05/2018   Neurogenic bowel 06/05/2018   Vaginal yeast infection 01/30/2018   Healthcare-associated pneumonia 01/25/2018   Chronic allergic rhinitis 01/24/2018   Depression with anxiety 01/24/2018   UTI due to Klebsiella species 01/24/2018   Protein-calorie malnutrition, severe ([place]) 01/24/2018   S/P insertion of IVC (inferior vena caval) filter 01/24/2018   Tetraparesis ([place]) 01/20/2018   Neurogenic bladder 01/20/2018   Reactive depression    Benign essential HTN    Acute postoperative anemia due to expected blood loss    Central cord syndrome at C6 level of cervical spinal cord ([place]) 11/29/2017   Allergy to alpha-gal 11/25/2016   SCC (squamous cell carcinoma) 04/08/2014    REFERRING DIAG: Central Cord Syndrome at C6 of the Cervical Spinal Cord, Fall with Bilateral Closed Hip Fractures, with ORIF repair with Intramedullary Nailing of the Left Hip Fracture.  THERAPY DIAG:  Muscle weakness (generalized)  Rationale for Evaluation and Treatment Rehabilitation  PERTINENT HISTORY: Patient s/p fall November 29, 2017 resulting in diagnosis of central cord syndrome at C 6 level.  She has had therapy in multiple venues but with recent move to [facility], therapy staff recommended she seek outpatient therapy for her needs. Pt. Had a recent fall with Bilateral Closed Hip Fractures, with ORIF repair with Intramedullary Nailing of the Left Hip Fracture.   PRECAUTIONS: None  SUBJECTIVE:   Pt. reports that the loaner chair is very uncomfortable to achieve the upright midline sitting position.  PAIN:  Are you having pain? No     OBJECTIVE:   TODAY'S TREATMENT:   Manual Therapy:   Pt. tolerated soft tissue massage to the volar, and dorsal surface of each digit on the bilateral hands secondary to stiffness following moist heat modality. Manual therapy was performed independent of, and  in preparation for therapeutic Ex.   Therapeutic Ex.    Pt. tolerated PROM followed by AROM bilateral wrist extension, PROM for bilateral digit MP, PIP, and DIP flexion, and extension, thumb radial, and palmar abduction.Pt. worked on bilateral shoulder flexion, chest press, and elbow flexion/extension, as well as adding a combination of movements. Pt. Worked on BUE strengthening, and reciprocal motion using the UBE while seated for 6 min. with minimal resistance. Constant monitoring was provided. There. Ex. was performed following moist heat, and manual therapy.    Pt. continues to make progress with bilateral UE strength, and continues to try to engage her bilateral hands more during daily ADL, and IADL tasks. Pt. continues to tolerate Ther. Ex. stretches and manual therapy well, without pain. Pt. continues to present with tightness at the bilateral thumb webspace, and bilateral wrist flexor tightness, as well as digit MP, PIP, and DIP extensor tightness which continues to limit her ability to complete ADL tasks efficiently. Pt. continues to benefit from working on impoving ROM in order to work towards increasing bilateral hand grasp on objects, and increase engagement of her bilateral hands during ADLs, and IADL tasks.   PATIENT EDUCATION:  Education details: BUE ROM, hand function, UE functioning Person educated: Patient Education method: Explanation and Demonstration Education comprehension: verbalized understanding, returned demonstration, and needs further education   HOME EXERCISE PROGRAM Continue with ongoing HEPs  Measurements:  12/09/2021:  Shoulder flexion: R: 120(136), L: 138(150) Shoulder abduction: R: 108(120), L: 110(120) Elbow: R: 0-148, L: 0-146 Wrist flexion: R: 55(62), L: 78 Writ extension: R: 34(55), L: 3(4) RD: R: 12(20), L: 16(26) UD: R: 8(24), L: 6(18) Thumb radial abduction: R: 11(20), L: 8(20) Digit flexion to the [facility]: R:  2nd: 5cm(3.5cm), 3rd: 7.5 cm(5cm),  4th: 7cm(3cm), 5th: 6cm(5.5cm) L: 2nd: 8cm(8cm), 3rd: 8cm(8cm), 4th: 7.5cm(7cm), 5th: 7cm(7cm)  02/03/2022:   Shoulder flexion: R: 122(138), L: 148(154) Shoulder abduction: R: 109(125), L: 125(125 Elbow: R: 0-148, L: 0-146 Wrist flexion: R: 60(68), L: 79 Writ extension: R: 40(55), L: 3(10) RD: R: 14(24), L: 20(28) UD: R: 8(24), L: 6(18) Thumb radial abduction: R: 20(25), L: 8(20) Digit flexion to the [facility]: R:  2nd: 4.5cm(3cm), 3rd: 6.5cm(4.5cm), 4th: 6.5 cm(3cm), 5th: 4.5cm(5cm) L: 2nd: 8cm(8cm), 3rd: 8cm(7cm), 4th: 7.5cm(7cm), 5th: 6.5cm(6.5cm)            OT Long Term Goals - 07/06/21 1106  Target Date: 04/28/2022    OT LONG TERM GOAL #1   Title Patient and caregiver will demonstrate understanding of home exercise program for ROM.    Baseline 12/09/2021: Pt. Reports the restorative ROM program has not resumed at the facility. 10/28/2021: Pt. Is attempting exercises/stretches at home. The facility restorative aides have not resumed exercises. 09/14/2021:  Pt. Reports inconsistencies with restorative ROM at  her care home. Pt/caregiver education to be provided about ongoing ROM needs.    Time 12    Period Weeks    Status Deferred     OT LONG TERM GOAL #2   Title Pt.  will improve BUE strength to be able to sustain her UE's in elevation to efficiently, and effectively perform hair care, and self-grooming tasks.   Baseline 02/03/2022: Pt. has improved, and is now able to sustain her Bilateral UEs in elevation long enough to efficiently, and effectively perform haircare, and self-grooming tasks. 12/09/2021: right shoulder 4-/5, left shoulder 4/5. Pt. has improved to be able to reach up for hair care, and self-grooming tasks. 10/28/2021: Right shoulder 3+/5, Left shoulder 4-/5 09/14/2021:  BUE shoulder strength: 3+/5 for shoulder flexion, and abduction.   Time 12    Period Weeks    Status Achieved                OT LONG TERM GOAL #3   Title Patient will demonstrate improved composite finger flexion to hold deodorant to apply to underarms.    Baseline 02/03/2022: Pt. Has improved with digit flexion, however continues to have difficulty securely holding and using deoderant. 12/09/2021: Bilateral hand/digit MP, PIP, and DIP extension tightness limits her ability achieve digit flexion to hold, and apply deodorant. 10/28/2021:  Pt. Continues to have difficulty holding the deodorant. 01/12/2021: Pt. Presents with limited digit extension. Pt. Is able to initiate holding deodorant, however is unable to hold it while using it.   Time 12    Period Weeks    Status Ongoing   Target Date                  OT LONG TERM GOAL #4   Title Pt. Will improve bilateral wrist extension in preparation for anticipating, and initiating reaching for objects at the table.    Baseline 02/03/2022: Right: 34(55) Left: 3(4) 12/09/2021: Right  34(55)  Left  3(4) 10/28/2021:  Right 22(38), Left 0(15) 09/14/2021:  Right: 17(35), left 2(15)   Time 12   Period weeks   Status Ongoing   Target Date      OT LONG TERM GOAL #5   Title Pt. will write, and sign her name with 100% legibility, and modified independence.    Baseline 02/03/2022: Pt. Continues to report legibility appears "shaky" when attempting signing her name. Pt.reports that she is writing more when filling out her menu, and complete puzzles.12/09/2021: Pt. Reports legibility appears "shaky" when attempting signing her name. Pt.reports that she is writing more when filling out her menu. 10/28/2021: pt. is able to sign her name, however legibility continues to be inconsistent. Pt. Reports "shaky appearance". 09/14/2021: Pt. continues to fill out her daily menu and complete puzzles. Pt. Continues to present with limited writing legibility.   Time 12    Period Weeks    Status  Ongoing         OT LONG TERM GOAL  #6   TITLE Pt. will turn pages in a book with modified  independence.    Baseline 02/03/2022: Pt. Is able to  effectively turn pages of a book. 10/28/2021: Pt. Is able to consistently turn book pages. 09/14/2021: Pt. has difficulty turning pages.    Time 12    Period Weeks    Status Achieved       OT LONG TERM GOAL  #7  TITLE Pt. Will increase  bilateral lateral pinch strength by 2 lbs to be able to securely grasp items during ADLs, and IADL tasks.   Baseline 02/03/2022: Right: 6#, Left: 4# 12/09/2021:  Right: 6#, Left: 4#   Time 12  Period Weeks  Status Ongoing           OT LONG TERM GOAL  #8  TITLE Pt. Will button a sweater with large 1" buttons, and zip her jacket with modified independence.   Baseline 02/03/2022: Pt. is unable to manipulate buttons on her sweater, or zippers on her jacket.  Time 12  Period Weeks  Status  New   OT LONG TERM GOAL  #9  TITLE Pt. will complete plant care with modified independence.  Baseline 02/03/2022: Pt. has difficulty caring for her plants.   Time 12  Period Weeks  Status New   OT LONG TERM GOAL  #10  TITLE Pt. will demonstrate adaptive techniques to assist with the efficiency of self-dressing, or morning care tasks.  Baseline 02/03/2022: Pt. Requires assist from caregivers 2/2 time limitations during morning care.   Time 12  Period Weeks  Status New     Plan - 07/22/21 1737     Clinical Impression Statement Pt. continues to make progress with bilateral UE strength, and continues to try to engage her bilateral hands more during daily ADL, and IADL tasks. Pt. continues to tolerate Ther. Ex. stretches and manual therapy well, without pain. Pt. continues to present with tightness at the bilateral thumb webspace, and bilateral wrist flexor tightness, as well as digit MP, PIP, and DIP extensor tightness which continues to limit her ability to complete ADL tasks efficiently. Pt. continues to benefit from working on impoving ROM in order to work towards increasing bilateral hand grasp on objects, and  increase engagement of her bilateral hands during ADLs, and IADL tasks.  .                     OT Occupational Profile and History Detailed Assessment- Review of Records and additional review of physical, cognitive, psychosocial history related to current functional performance    Occupational performance deficits (Please refer to evaluation for details): ADL's;IADL's;Leisure;Social Participation    Body Structure / Function / Physical Skills ADL;Continence;Dexterity;Flexibility;Strength;ROM;Balance;Coordination;FMC;IADL;Endurance;UE functional use;Decreased knowledge of use of DME;[facility]    Psychosocial Skills Coping Strategies    Rehab Potential Fair    Clinical Decision Making Several treatment options, min-mod task modification necessary    Comorbidities Affecting Occupational Performance: Presence of comorbidities impacting occupational performance    Comorbidities impacting occupational performance description: contractures of bilateral hands, dependent transfers,    Modification or Assistance to Complete Evaluation  No modification of tasks or assist necessary to complete eval    OT Frequency 2x / week    OT Duration 12 weeks    OT Treatment/Interventions Self-care/ADL training;Cryotherapy;Therapeutic exercise;DME and/or AE instruction;Balance training;Neuromuscular education;Manual Therapy;Splinting;Moist Heat;Passive range of motion;Therapeutic activities;Patient/family education    Plan continue to progress ROM of digits, wrists and shoulders as it pertains to completion of ADL tasks that pt values.    Consulted and Agree with Plan of Care Patient  Harrel Carina, MS, OTR/L  Harrel Carina, OT 03/29/2022, 4:59 PM      `

## 2022-03-29 NOTE — Progress Notes (Signed)
Location:  Other [facility] Room Number: [id number] Place of Service:  SNF [phone]) Provider:  Durenda Age, NP   Patient Care Team: Dewayne Shorter, MD as PCP - General (Family Medicine)  Extended Emergency Contact Information Primary Emergency Contact: [name] Mobile Phone: [phone] Relation: Daughter Secondary Emergency Contact: Lindi Adie Address: [address] [place]          [place], [place] [address] Johnnette Litter of [place] Phone: [phone] Relation: Spouse  Code Status:  Full Code Goals of care: Advanced Directive information    03/29/2022   10:13 AM  Advanced Directives  Does Patient Have a Medical Advance Directive? Yes  Type of Paramedic of [place];Living will;Out of facility DNR (pink MOST or yellow form)  Does patient want to make changes to medical advance directive? No - Patient declined  Copy of [place] in Chart? Yes - validated most recent copy scanned in chart (See row information)  Pre-existing out of facility DNR order (yellow form or pink MOST form) Yellow form placed in chart (order not valid for inpatient use)     Chief Complaint  Patient presents with   Acute Visit    Blister on thigh     HPI:  Pt is a 88 y.o. female seen today for an acute visit for  right thigh blister. She is a long-term care resident of [facility]. She has a chronic foley catheter due to neurogenic bladder. She was noted to have fluid-filled blister on her right upper anterior thigh. No reported fever.   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[place])    Past Surgical History:  Procedure  Laterality Date   ANTERIOR CERVICAL DECOMP/DISCECTOMY FUSION N/A 11/29/2017   Procedure: Cervical five-six, six-seven Anterior Cervical Decompression Fusion;  Surgeon: Judith Part, MD;  Location: [facility];  Service: Neurosurgery;  Laterality: N/A;  Cervical five-six, six-seven Anterior Cervical Decompression Fusion   CATARACT EXTRACTION     FEMUR IM NAIL Left 08/16/2021   Procedure: INTRAMEDULLARY (IM) NAIL FEMORAL;  Surgeon: Earnestine Leys, MD;  Location: [facility];  Service: Orthopedics;  Laterality: Left;   IR IVC FILTER PLMT / S&I /IMG GUID/MOD SED  12/08/2017    Allergies  Allergen Reactions   Other Swelling   Lisinopril-Hydrochlorothiazide Swelling    Swelling of the tongue   Beef-Derived Products Swelling    Facial and lip swelling (due to tick bite)   Dairy Aid [Tilactase] Swelling   Lisinopril-Hydrochlorothiazide Swelling    Tongue swelling   Pork-Derived Products Swelling    Outpatient Encounter Medications as of 03/29/2022  Medication Sig   acetic acid 0.25 % irrigation    alendronate (FOSAMAX) 70 MG tablet Take 70 mg by mouth every Saturday.   calcium carbonate (OSCAL) 1500 (600 Ca) MG TABS tablet Take 1 tablet by mouth daily with breakfast.   Capsaicin 0.1 % CREA Apply 1 application  topically every evening. (Apply to hands)   carboxymethylcellulose (REFRESH PLUS) 0.5 % SOLN Place 2 drops into both eyes every 6 (six) hours as needed (dry eyes).   cetirizine (ZYRTEC) 5 MG tablet Take 5 mg by  mouth daily.   cholecalciferol (VITAMIN D3) 25 MCG (1000 UNIT) tablet Take 1,000 Units by mouth daily.   DULoxetine (CYMBALTA) 60 MG capsule Take 60 mg by mouth daily.   EPINEPHrine 0.3 mg/0.3 mL IJ SOAJ injection Inject 0.3 mg into the muscle as needed for anaphylaxis.   ferrous sulfate 325 (65 FE) MG tablet Take 325 mg by mouth 2 (two) times daily with a meal.   gabapentin (NEURONTIN) 100 MG capsule Take 1 capsule (100 mg total) by mouth daily.   guaiFENesin (MUCINEX) 600 MG 12  hr tablet Take 600 mg by mouth 2 (two) times daily as needed for to loosen phlegm.   Infant Care Products [facility]) OINT Apply to buttocks/gluteal folds topically every shift.   lactulose, encephalopathy, (CHRONULAC) 10 GM/15ML SOLN Take 30 mLs by mouth daily as needed (mild constipation).   magnesium citrate solution Take 60-120 mLs by mouth daily as needed (constipation).   methylcellulose oral powder Take 1 packet by mouth at bedtime.   mometasone (ELOCON) 0.1 % lotion Apply 1 Application topically every Saturday at 6 PM. (Apply to the ear canal for eczema)   Multiple Vitamin (MULTIVITAMIN WITH MINERALS) TABS tablet Take 1 tablet by mouth daily.   nystatin cream (MYCOSTATIN) Apply 1 Application topically daily.   polyethylene glycol (MIRALAX / GLYCOLAX) 17 g packet Take 17 g by mouth 2 (two) times daily.   rivaroxaban (XARELTO) 20 MG TABS tablet Take 1 tablet (20 mg total) by mouth daily with supper.   saccharomyces boulardii (FLORASTOR) 250 MG capsule Take 250 mg by mouth daily.   Saline Spray 0.2 % SOLN Place 2 sprays into both nostrils as needed (nasal congestion).   simethicone (MYLICON) 80 MG chewable tablet Chew 80 mg by mouth every 6 (six) hours as needed for flatulence.   sodium phosphate (FLEET) 7-19 GM/118ML ENEM Place 1 enema rectally every other day as needed for severe constipation.   spironolactone (ALDACTONE) 25 MG tablet Take 1 tablet (25 mg total) by mouth daily.   sulfamethoxazole-trimethoprim (BACTRIM DS) 800-160 MG tablet Take 1 tablet by mouth 2 (two) times daily. For 10 days   tiZANidine (ZANAFLEX) 2 MG tablet Take 1 tablet (2 mg total) by mouth 2 (two) times daily.   triamcinolone cream (KENALOG) 0.1 % Apply to thigh and lower legs topically every 8 hours as needed. Apply to back of neck topically as needed.   acetaminophen (TYLENOL) 500 MG tablet Take 500 mg by mouth every 12 (twelve) hours as needed for mild pain.   acetaminophen (TYLENOL) 650 MG CR tablet Take 650  mg by mouth every 8 (eight) hours as needed for pain.   No facility-administered encounter medications on file as of 03/29/2022.    Review of Systems  Constitutional:  Negative for appetite change, chills, fatigue and fever.  HENT:  Negative for congestion, hearing loss, rhinorrhea and sore throat.   Eyes: Negative.   Respiratory:  Negative for cough, shortness of breath and wheezing.   Cardiovascular:  Negative for chest pain, palpitations and leg swelling.  Gastrointestinal:  Negative for abdominal pain, constipation, diarrhea, nausea and vomiting.  Genitourinary:  Negative for dysuria.  Musculoskeletal:  Negative for myalgias.  Skin:  Negative for color change, rash and wound.       blister  Neurological:  Negative for dizziness, weakness and headaches.  Psychiatric/Behavioral:  Negative for behavioral problems. The patient is not nervous/anxious.     Immunization History  Administered Date(s) Administered   Influenza, High Dose Seasonal PF  11/17/2021   Influenza-Unspecified 11/14/2018, 11/20/2019, 11/17/2020   Moderna Covid-19 Vaccine Bivalent Booster 32yr & up 06/30/2021, 12/11/2021   Moderna Sars-Covid-2 Vaccination 02/09/2019, 03/09/2019, 12/14/2019, 06/20/2020, 10/24/2020   PPD Test 05/16/2019   Tdap 11/29/2017   Pertinent  Health Maintenance Due  Topic Date Due   INFLUENZA VACCINE  Completed   DEXA SCAN  Discontinued      08/18/2021    8:00 PM 08/19/2021   10:00 AM 08/19/2021    8:54 PM 08/20/2021    8:20 AM 03/29/2022   10:11 AM  Fall Risk  Falls in the past year?     0  Was there an injury with Fall?     0  Fall Risk Category Calculator     0  (RETIRED) Patient Fall Risk Level High fall risk High fall risk High fall risk High fall risk   Patient at Risk for Falls Due to     No Fall Risks   Functional Status Survey:    Vitals:   03/29/22 0951  BP: 118/68  Pulse: 82  Resp: 18  Temp: (!) 97.5 F (36.4 C)  SpO2: 96%  Weight: 170 lb 4 oz (77.2 kg)  Height:  '5\' 7"'$  (1.702 m)   Body mass index is 26.66 kg/m. Physical Exam Constitutional:      General: She is not in acute distress. HENT:     Head: Normocephalic and atraumatic.     Nose: Nose normal.     Mouth/Throat:     Mouth: Mucous membranes are moist.  Eyes:     Conjunctiva/sclera: Conjunctivae normal.  Cardiovascular:     Rate and Rhythm: Normal rate and regular rhythm.  Pulmonary:     Effort: Pulmonary effort is normal.     Breath sounds: Normal breath sounds.  Abdominal:     General: Bowel sounds are normal.     Palpations: Abdomen is soft.  Genitourinary:    Comments: Foley catheter Musculoskeletal:     Cervical back: Normal range of motion.     Comments: Left foot brace Bilateral hands contracted  Skin:    General: Skin is warm and dry.     Comments: Blister on right upper anterior thigh  Neurological:     Mental Status: She is alert. Mental status is at baseline.  Psychiatric:        Mood and Affect: Mood normal.        Behavior: Behavior normal.        Judgment: Judgment normal.     Labs reviewed: Recent Labs    08/18/21 0423 08/19/21 0333 08/20/21 0443 09/24/21 0000 02/08/22 0000  NA 124* 130* 130* 137 137  K 4.6 5.1 4.5 4.7 4.8  CL 95* 102 101 101 98*  CO2 '23 23 26 '$ 30* 33*  GLUCOSE 117* 117* 101*  --   --   BUN 29* '23 15 11 10  '$ CREATININE 0.96 0.72 0.54 0.5 0.5  CALCIUM 7.1* 7.4* 7.1* 8.5* 9.0   Recent Labs    09/24/21 0000 02/08/22 0000  AST  --  18  ALT  --  14  ALKPHOS  --  65  ALBUMIN 3.2* 3.7   Recent Labs    08/15/21 1035 08/16/21 1333 08/18/21 0423 08/18/21 1759 08/19/21 0333 08/20/21 0443 08/20/21 1029 11/26/21 0000 02/08/22 0000 02/25/22 0000  WBC 7.4   < > 13.0*  --  10.6* 8.9  --  6.3 5.5 5.1  NEUTROABS 5.2  --   --   --   --   --   --   --  3,036.00 2,800.00  HGB 12.9   < > 7.7*   < > 8.9* 7.8*   < > 10.5* 11.7* 10.4*  HCT 41.3   < > 22.7*   < > 27.5* 23.7*  --  32* 35* 32*  MCV 89.8   < > 87.6  --  88.4 87.1  --   --    --   --   PLT 335   < > 180  --  198 216  --  346 321 318   < > = values in this interval not displayed.   No results found for: "TSH" No results found for: "HGBA1C" No results found for: "CHOL", "HDL", "LDLCALC", "LDLDIRECT", "TRIG", "CHOLHDL"  Significant Diagnostic Results in last 30 days:  No results found.  Assessment/Plan  1. Blister -  thought to be due to tape and foley catheter causing irritation to skin -  keep skin clean and dry -  monitor for redness, warmth, signs of infection  2. Neurogenic bladder -  catheter care daily -  change catheter monthly and PRN     Family/ staff Communication:  Discussed plan of care with resident and charge nurse.  Labs/tests ordered:  None

## 2022-03-29 NOTE — Therapy (Signed)
OUTPATIENT PHYSICAL THERAPY NEURO TREATMENT  Patient Name: Sherry [name] MRN: [id number] DOB:03-Jul-1934, 88 y.o., female Today's Date: 03/29/2022   PCP: Sherry Shorter, MD REFERRING PROVIDER: Earnestine [name]   PT End of Session - 03/29/22 1146     Visit Number 31    Number of Visits 43    Date for PT Re-Evaluation 04/05/22    Authorization Time Period Initial cert [id number]; Recert [id number]    Progress Note Due on Visit 56    PT Start Time 1145    PT Stop Time 1220    PT Time Calculation (min) 35 min    Equipment Utilized During Treatment Gait belt    Activity Tolerance Patient tolerated treatment well    Behavior During Therapy WFL for tasks assessed/performed                          Past Medical History:  Diagnosis Date   Acute blood loss anemia    Arthritis    Cancer ([place])    skin   Central cord syndrome at C6 level of cervical spinal cord ([place]) 11/29/2017   Hypertension    Protein-calorie malnutrition, severe ([place]) 01/24/2018   S/P insertion of IVC (inferior vena caval) filter 01/24/2018   Tetraparesis ([place])    Past Surgical History:  Procedure Laterality Date   ANTERIOR CERVICAL DECOMP/DISCECTOMY FUSION N/A 11/29/2017   Procedure: Cervical five-six, six-seven Anterior Cervical Decompression Fusion;  Surgeon: Sherry Part, MD;  Location: [facility];  Service: Neurosurgery;  Laterality: N/A;  Cervical five-six, six-seven Anterior Cervical Decompression Fusion   CATARACT EXTRACTION     FEMUR IM NAIL Left 08/16/2021   Procedure: INTRAMEDULLARY (IM) NAIL FEMORAL;  Surgeon: Sherry Leys, MD;  Location: [facility];  Service: Orthopedics;  Laterality: Left;   IR IVC FILTER PLMT / S&I /IMG GUID/MOD SED  12/08/2017   Patient Active Problem List   Diagnosis Date Noted   Hypertensive heart disease with other congestive heart failure ([place]) 02/14/2022   Chronic indwelling Foley catheter 02/14/2022   Closed hip fracture ([place])  08/15/2021   DVT (deep venous thrombosis) ([place]) 08/15/2021   Quadriplegia ([place]) 08/15/2021   Fall 08/15/2021   Constipation due to slow transit 08/31/2018   Trauma 06/05/2018   Neuropathic pain 06/05/2018   Neurogenic bowel 06/05/2018   Vaginal yeast infection 01/30/2018   Healthcare-associated pneumonia 01/25/2018   Chronic allergic rhinitis 01/24/2018   Depression with anxiety 01/24/2018   UTI due to Klebsiella species 01/24/2018   Protein-calorie malnutrition, severe ([place]) 01/24/2018   S/P insertion of IVC (inferior vena caval) filter 01/24/2018   Tetraparesis ([place]) 01/20/2018   Neurogenic bladder 01/20/2018   Reactive depression    Benign essential HTN    Acute postoperative anemia due to expected blood loss    Central cord syndrome at C6 level of cervical spinal cord ([place]) 11/29/2017   Allergy to alpha-gal 11/25/2016   SCC (squamous cell carcinoma) 04/08/2014    ONSET DATE: 08/15/2021 (fall with B hip fx); Initial injury was in 2019- quadraplegia due to central cord syndrom of C6 secondary to neck fracture.   REFERRING DIAG: Bilateral Hip fx; Left Open- s/p Left IM nail femoral on 08/16/2021; Right closed  THERAPY DIAG:  Muscle weakness (generalized)  Other lack of coordination  Abnormality of gait and mobility  Difficulty in walking, not elsewhere classified  Other abnormalities of gait and mobility  Closed fracture of both hips, initial encounter (HCC)  Unsteadiness on feet  Rationale  for Evaluation and Treatment Rehabilitation  SUBJECTIVE:                                                                                                                                                                                              SUBJECTIVE STATEMENT:    Patient reports feeling tired today. States still recovering from illness (UTI) and states not sleeping well at night right now.    Pt accompanied by:  Paid caregiver-   PERTINENT HISTORY: Patient was  previous patient (see previous episode for specifics) She experienced a fall at home on 08/15/2021 and was in [facility] from 08/15/2021- 08/20/2021.  Most recent history per Sherry [name] on 08/20/2021.  Sherry [name] is a 88 y.o. 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   Patient has history of quadriplegia, and has been doing PT/OT with improvement of function. Pt has some use of both arms and can feed herself. She can walk a little bit with assistance. Pt accidentally fell at about 930 this morning after taking shower.  She injured her hips. Hospital Course:    Closed left hip fracture [facility]) s/p mechanical fall Closed right hip fracture (?old) Acute blood loss anemia --pt is s/p  INTRAMEDULLARY (IM) NAIL FEMORAL-left on 7/16  PAIN:  Are you having pain? NO: NPRS scale: 0/10 Pain location: Right hip Pain description: Ache Aggravating factors: any active Movement of right side Relieving factors: Rest  PRECAUTIONS: Fall  WEIGHT BEARING RESTRICTIONS Yes Bilateral non-weight bearing B LE's  FALLS: Has patient fallen in last 6 months? Yes. Number of falls 1  LIVING ENVIRONMENT: Lives with: lives with their family and lives in a nursing home Lives in: Other Long term care at [place]: No Has following equipment at home: Wheelchair (power), Ramped entry, and hoyer lift  PLOF: Needs assistance with ADLs, Needs assistance with homemaking, Needs assistance with gait, Needs assistance with transfers, and Leisure: Work puzzles, read, Watch TV  PATIENT GOALS Patient goal is to walk; Be able to strengthen my arms for better use  OBJECTIVE:   DIAGNOSTIC FINDINGS: Overall cognitive statusEXAM: DG HIP (WITH OR WITHOUT PELVIS) 2-3V LEFT; DG HIP (WITH OR  WITHOUT PELVIS) 2-3V RIGHT   COMPARISON:  CT of the abdomen and pelvis 01/21/2020   FINDINGS: Left proximal femur fracture noted just below the intertrochanteric region. Marked ventral angulation noted on the cross-table image. Slight varus angulation present  Comminuted intratrochanteric fracture present on the right also with marked ventral angulation.   IMPRESSION: 1. Bilateral hip fractures. 2. Comminuted intertrochanteric fracture of the right hip. 3. Angulated fracture of the proximal left femur just below the intertrochanteric region.     Electronically Signed   By: San Morelle M.D.   On: 08/15/2021 11:54:   COGNITION:  Within functional limits for tasks assessed   SENSATION: Light touch: Impaired   EDEMA:  None  Strength R/L 2-/2- Shoulder flexion (anterior deltoid/pec major/coracobrachialis, axillary n. (C5-6) and musculocutaneous n. (C5-7)) 2-/3+ (within available ROM around 130 on lef)Shoulder abduction (deltoid/supraspinatus, axillary/suprascapular n, C5) 3+/3+ Shoulder external rotation (infraspinatus/teres minor) 4/4 Shoulder internal rotation (subcapularis/lats/pec major) 3+/3+ Shoulder extension (posterior deltoid, lats, teres major, axillary/thoracodorsal n.) 3+/3+ Shoulder horizontal abduction 4/4 Elbow flexion (biceps brachii, brachialis, brachioradialis, musculoskeletal n, C5-6) 4/4 Elbow extension (triceps, radial n, C7) 2-/2- Wrist Extension 3+/3+ Wrist Flexion   LOWER EXTREMITY MMT:    MMT Right 01/11/22 Left 01/11/22  Hip flexion 2- 2-  Hip extension 2- 2-  Hip abduction 2- 2-  Hip adduction 2- 3  Hip internal rotation 2- 2-  Hip external rotation 2- 2-  Knee flexion 2- 2-  Knee extension 2+ 3+  Ankle dorsiflexion 2- NT - boot  Ankle plantarflexion 2- NT- boot  Ankle inversion 2- NT- boot  Ankle eversion 2- NT- boot  (Blank rows = not tested)  BED MOBILITY:  Unable to test due to NWB status of BLE- Patient currently  dependent with all bed mobility using hoyer and caregiver assist  TRANSFERS: Currently BLE NWB- Dependent on use of hoyer for all transfers  FUNCTIONAL TESTs:   PATIENT SURVEYS:  FOTO 12  TODAY'S TREATMENT:  ADJUSTED GOALS and added transfer/standing goals MMT = continues to be 2-/5 throughout Left and Right LE except left knee ext= 3+/5  THERE.EX:    AAROM- B hip flex 2 sets x 15 reps    AAROM- R LAQ - 1 set of 7 reps (almost full ROM) and 8 more of AAROM reps; 2nd set x 10 reps AAROM.    AROM- Left LAQ- 1 sets of 15 reps and then progressed to 4lb. Left LE another set of 10 reps   Seated LAQ with AAROM on right LE - 2 sets of 15 reps   Seated hip adduction squeeze on red ball - hold 5 sec - 2 sets of 10 reps   Straight leg hip abd with PT holding heel for support - 2 sets of 15 reps each LE   AROM B knee flexion x 15 reps each LE.    Manual resistive Leg press (seated) x 12 reps each LE    Pt educated throughout session about proper posture and technique with exercises. Improved exercise technique, movement at target joints, use of target muscles after min to mod verbal, visual, tactile cues.   PATIENT EDUCATION: Education details: PT plan of care/use of resistive bands Person educated: Patient Education method: Explanation, Demonstration, Tactile cues, and Verbal cues Education comprehension: verbalized understanding, returned demonstration, verbal cues required, tactile cues required, and needs further education   HOME EXERCISE PROGRAM: Discussed use of theraband and demo shoulder flex/abd and elbow flex and ext with use of yellow TB. Patient required assist to position therband but able to perform well with only one person assist  Access Code: [facility] URL: [email or web address] Date: 11/23/2021 Prepared by: Janna Arch Exercises - Seated Gluteal Sets - 2 x daily - 7 x weekly - 2  sets - 10 reps - 5 hold - Seated Quad Set - 2 x daily - 7 x weekly - 2 sets -  10 reps - 5 hold - Seated Long Arc Quad - 2 x daily - 7 x weekly - 2 sets - 10 reps - 5 hold - Seated March - 2 x daily - 7 x weekly - 2 sets - 10 reps - 5 hold - Seated Hip Abduction - 2 x daily - 7 x weekly - 2 sets - 10 reps - 5 hold - Seated Shoulder Shrugs - 2 x daily - 7 x weekly - 2 sets - 10 reps - 5 hold - Wheelchair Pressure Relief - 2 x daily - 7 x weekly - 2 sets - 10 reps - 5 hold  GOALS: Goals reviewed with patient? Yes  SHORT TERM GOALS: Target date: 02/22/2022  Pt will be independent with initial UE strengthening HEP in order to improve strength and balance in order to decrease fall risk and improve function at home and work. Baseline: 10/19/2021- patient with no formal UE HEP; 12/02/2021= Patient verbalized knowledge of HEP including use of theraband for UE strengthening.  Goal status: GOAL MET  LONG TERM GOALS: Target date: 04/05/2022  Pt will be independent with final for UE/LE HEP in order to improve strength and balance in order to decrease fall risk and improve function at home and work. Baseline: Patient is currently BLE NWB and unable to participate in HEP. Has order for UE strengthening. 01/11/2022- Patient now able to participate in LE strengthening although NWB still- good understanding for some basic exercises. Will keep goal active to incorporate progressive LE strengthening exercises. Goal status: Progressing  2.  Pt will improve FOTO to target score of 35  to display perceived improvements in ability to complete ADL's.  Baseline: 10/19/2021= 12; 12/02/2021= 17; 01/11/2022=17 Goal status: Ongoing  3.  Pt will increase strength of B UE  by at least 1/2 MMT grade in order to demonstrate improvement in strength and function  Baseline: patient has range of 2-/5 to 4/5 BUE Strength; 12/02/2021= 4/5 except with wrist ext. 01/11/2022= 4/5 B UE strength expept for wrist Goal status: Partially met  4. Pt. Will increase strength of RLE by at least 1/2 MMT grade in order to  demonstrate improvement in Standing/transfers.  Baseline: 2-/5 Right hip flex/knee ext/flex  Goal status: Initial  5. Pt. Will demo ability to stand pivot transfer with max assist for improved functional mobility and less dependent need on mechanical device.   Baseline: Dependent on hoyer lift for all transfers  Goal status: Initial  6. Pt. Will demonstrate improved functional LE strength as seen by ability to stand > 2 min for improved transfer ability and pregait abilities.   Baseline: not assessed today due to recent dx: UTI  Goal status: initial  ASSESSMENT:  CLINICAL IMPRESSION:  Treatment still limited to just LE strengthening as patient still not feeling well after UTI and dealing with being tired from not sleeping well. She was able to extend right knee to near ROM for several reps today- able to  perform Left LE exercises with increased weight/resistance. She was limited by fatigue but overall performed with progress seen in resistance today. Pt will continue to benefit from skilled PT services to improve strength in anticipation for BLE clearance for [place] activities.    OBJECTIVE IMPAIRMENTS Abnormal gait, decreased activity tolerance, decreased balance, decreased coordination, decreased endurance, decreased mobility, difficulty walking, decreased ROM, decreased strength, hypomobility,  impaired flexibility, impaired UE functional use, postural dysfunction, and pain.   ACTIVITY LIMITATIONS carrying, lifting, bending, sitting, standing, squatting, sleeping, stairs, transfers, bed mobility, continence, bathing, toileting, dressing, self feeding, reach over head, hygiene/grooming, and caring for others  PARTICIPATION LIMITATIONS: meal prep, cleaning, laundry, medication management, personal finances, interpersonal relationship, driving, shopping, community activity, and yard work  PERSONAL FACTORS Age, Time since onset of injury/illness/exacerbation, and 1-2 comorbidities: HTN,  cervical Sx  are also affecting patient's functional outcome.   REHAB POTENTIAL: Good  CLINICAL DECISION MAKING: Evolving/moderate complexity  EVALUATION COMPLEXITY: Moderate  PLAN: PT FREQUENCY: 1-2x/week  PT DURATION: 12 weeks  PLANNED INTERVENTIONS: Therapeutic exercises, Therapeutic activity, Neuromuscular re-education, Balance training, Gait training, Patient/Family education, Self Care, Joint mobilization, DME instructions, Dry Needling, Electrical stimulation, Wheelchair mobility training, Spinal mobilization, Cryotherapy, Moist heat, and Manual therapy  PLAN FOR NEXT SESSION: Continue with weight bearing activities and LE strengthening as appropriate.  Ollen Bowl, PT Physical Therapist- [facility]  03/29/2022, 2:06 PM

## 2022-03-31 ENCOUNTER — Ambulatory Visit: Payer: Medicare HMO | Admitting: Occupational Therapy

## 2022-03-31 ENCOUNTER — Ambulatory Visit: Payer: Medicare HMO

## 2022-03-31 DIAGNOSIS — S72001A Fracture of unspecified part of neck of right femur, initial encounter for closed fracture: Secondary | ICD-10-CM

## 2022-03-31 DIAGNOSIS — R278 Other lack of coordination: Secondary | ICD-10-CM

## 2022-03-31 DIAGNOSIS — R2689 Other abnormalities of gait and mobility: Secondary | ICD-10-CM

## 2022-03-31 DIAGNOSIS — R2681 Unsteadiness on feet: Secondary | ICD-10-CM

## 2022-03-31 DIAGNOSIS — R269 Unspecified abnormalities of gait and mobility: Secondary | ICD-10-CM

## 2022-03-31 DIAGNOSIS — M6281 Muscle weakness (generalized): Secondary | ICD-10-CM

## 2022-03-31 DIAGNOSIS — S14129S Central cord syndrome at unspecified level of cervical spinal cord, sequela: Secondary | ICD-10-CM

## 2022-03-31 DIAGNOSIS — R262 Difficulty in walking, not elsewhere classified: Secondary | ICD-10-CM

## 2022-03-31 NOTE — Therapy (Signed)
Occupational Therapy Treatment Note    Patient Name: Sherry [name] MRN: [id number] DOB:08-Mar-1934, 88 y.o., female Today's Date: 03/31/2022                                         PCP: Dr. Viviana Simpler REFERRING PROVIDER: Dr. Viviana Simpler   OT End of Session - 03/31/22 1220     Visit Number [id number]    Number of Visits 16    Date for OT Re-Evaluation 04/28/22    Authorization Time Period Progress report period starting 12/14/2021    OT Start Time 62    OT Stop Time 1145    OT Time Calculation (min) 45 min    Activity Tolerance Patient tolerated treatment well    Behavior During Therapy [facility] for tasks assessed/performed                 Past Medical History:  Diagnosis Date   Acute blood loss anemia    Arthritis    Cancer ([place])    skin   Central cord syndrome at C6 level of cervical spinal cord ([place]) 11/29/2017   Hypertension    Protein-calorie malnutrition, severe ([place]) 01/24/2018   S/P insertion of IVC (inferior vena caval) filter 01/24/2018   Tetraparesis ([place])    Past Surgical History:  Procedure Laterality Date   ANTERIOR CERVICAL DECOMP/DISCECTOMY FUSION N/A 11/29/2017   Procedure: Cervical five-six, six-seven Anterior Cervical Decompression Fusion;  Surgeon: Judith Part, MD;  Location: [place];  Service: Neurosurgery;  Laterality: N/A;  Cervical five-six, six-seven Anterior Cervical Decompression Fusion   CATARACT EXTRACTION     FEMUR IM NAIL Left 08/16/2021   Procedure: INTRAMEDULLARY (IM) NAIL FEMORAL;  Surgeon: Earnestine Leys, MD;  Location: [facility];  Service: Orthopedics;  Laterality: Left;   IR IVC FILTER PLMT / S&I /IMG GUID/MOD SED  12/08/2017   Patient Active Problem List   Diagnosis Date Noted   Hypertensive heart disease with other congestive heart failure ([place]) 02/14/2022   Chronic indwelling Foley catheter 02/14/2022   Closed hip fracture ([place]) 08/15/2021   DVT (deep venous thrombosis) ([place]) 08/15/2021   Quadriplegia ([place]) 08/15/2021    Fall 08/15/2021   Constipation due to slow transit 08/31/2018   Trauma 06/05/2018   Neuropathic pain 06/05/2018   Neurogenic bowel 06/05/2018   Vaginal yeast infection 01/30/2018   Healthcare-associated pneumonia 01/25/2018   Chronic allergic rhinitis 01/24/2018   Depression with anxiety 01/24/2018   UTI due to Klebsiella species 01/24/2018   Protein-calorie malnutrition, severe ([place]) 01/24/2018   S/P insertion of IVC (inferior vena caval) filter 01/24/2018   Tetraparesis ([place]) 01/20/2018   Neurogenic bladder 01/20/2018   Reactive depression    Benign essential HTN    Acute postoperative anemia due to expected blood loss    Central cord syndrome at C6 level of cervical spinal cord ([place]) 11/29/2017   Allergy to alpha-gal 11/25/2016   SCC (squamous cell carcinoma) 04/08/2014    REFERRING DIAG: Central Cord Syndrome at C6 of the Cervical Spinal Cord, Fall with Bilateral Closed Hip Fractures, with ORIF repair with Intramedullary Nailing of the Left Hip Fracture.  THERAPY DIAG:  Muscle weakness (generalized)  Rationale for Evaluation and Treatment Rehabilitation  PERTINENT HISTORY: Patient s/p fall November 29, 2017 resulting in diagnosis of central cord syndrome at C 6 level.  She has had therapy in multiple venues but with recent move to [facility]  [place], therapy staff recommended she seek outpatient therapy for her needs. Pt. Had a recent fall with Bilateral Closed Hip Fractures, with ORIF repair with Intramedullary Nailing of the Left Hip Fracture.   PRECAUTIONS: None  SUBJECTIVE:   Pt. reports doing well today.  PAIN:  Are you having pain? No     OBJECTIVE:   TODAY'S TREATMENT:   Manual Therapy:   Pt. tolerated soft tissue massage to the volar, and dorsal surface of each digit on the bilateral hands secondary to stiffness following moist heat modality. Soft tissue mobilization was performed for Carpal, and metacarpal spread stretches for the left hand only.(Dermatological  surgical site at the dorsal of the right hand still healing with a bandage present.) Manual therapy was performed independent of, and in preparation for therapeutic Ex.   Therapeutic Ex.    Pt. tolerated PROM followed by AROM bilateral wrist extension, PROM for bilateral digit MP, PIP, and DIP flexion, and extension, thumb radial, and palmar abduction.Pt. worked on bilateral shoulder flexion, chest press, and elbow flexion/extension, as well as adding a combination of movements. Pt. Worked on BUE strengthening, and reciprocal motion using the UBE while seated for 8 min. with minimal resistance. Constant monitoring was provided. Pt. performed 2.5# dowel ex. For UE strengthening secondary to weakness. Bilateral shoulder flexion, and chest press. 1# dumbbell ex. for elbow flexion and extension,and forearm supination/pronation for 1 set 10 reps each. Pt. requires rest breaks and verbal cues for proper technique. There. Ex. was performed following moist heat, and manual therapy.    Pt. continues to make progress with bilateral UE strength, and continues to try to engage her bilateral hands more during daily ADL, and IADL tasks. Pt. Tolerated addition of the dowel, and dumbbell exercises. Pt. Continues to tolerate stretches and manual therapy well, without pain. Pt. continues to present with tightness at the bilateral thumb webspace, and bilateral wrist flexor tightness, as well as digit MP, PIP, and DIP extensor tightness which continues to limit her ability to complete ADL tasks efficiently. Pt. continues to benefit from working on impoving ROM in order to work towards increasing bilateral hand grasp on objects, and increase engagement of her bilateral hands during ADLs, and IADL tasks.   PATIENT EDUCATION:  Education details: BUE ROM, hand function, UE functioning Person educated: Patient Education method: Explanation and Demonstration Education comprehension: verbalized understanding, returned  demonstration, and needs further education   HOME EXERCISE PROGRAM Continue with ongoing HEPs  Measurements:  12/09/2021:  Shoulder flexion: R: 120(136), L: 138(150) Shoulder abduction: R: 108(120), L: 110(120) Elbow: R: 0-148, L: 0-146 Wrist flexion: R: 55(62), L: 78 Writ extension: R: 34(55), L: 3(4) RD: R: 12(20), L: 16(26) UD: R: 8(24), L: 6(18) Thumb radial abduction: R: 11(20), L: 8(20) Digit flexion to the [facility]: R:  2nd: 5cm(3.5cm), 3rd: 7.5 cm(5cm), 4th: 7cm(3cm), 5th: 6cm(5.5cm) L: 2nd: 8cm(8cm), 3rd: 8cm(8cm), 4th: 7.5cm(7cm), 5th: 7cm(7cm)  02/03/2022:   Shoulder flexion: R: 122(138), L: 148(154) Shoulder abduction: R: 109(125), L: 125(125 Elbow: R: 0-148, L: 0-146 Wrist flexion: R: 60(68), L: 79 Writ extension: R: 40(55), L: 3(10) RD: R: 14(24), L: 20(28) UD: R: 8(24), L: 6(18) Thumb radial abduction: R: 20(25), L: 8(20) Digit flexion to the [facility]: R:  2nd: 4.5cm(3cm), 3rd: 6.5cm(4.5cm), 4th: 6.5 cm(3cm), 5th: 4.5cm(5cm) L: 2nd: 8cm(8cm), 3rd: 8cm(7cm), 4th: 7.5cm(7cm), 5th: 6.5cm(6.5cm)            OT Long Term Goals - 07/06/21 1106  Target Date: 04/28/2022    OT LONG TERM GOAL #1   Title Patient and caregiver will demonstrate understanding of home exercise program for ROM.    Baseline 12/09/2021: Pt. Reports the restorative ROM program has not resumed at the facility. 10/28/2021: Pt. Is attempting exercises/stretches at home. The facility restorative aides have not resumed exercises. 09/14/2021:  Pt. Reports inconsistencies with restorative ROM at  her care home. Pt/caregiver education to be provided about ongoing ROM needs.    Time 12    Period Weeks    Status Deferred     OT LONG TERM GOAL #2   Title Pt.  will improve BUE strength to be able to sustain her UE's in elevation to efficiently, and effectively perform hair care, and self-grooming tasks.   Baseline  02/03/2022: Pt. has improved, and is now able to sustain her Bilateral UEs in elevation long enough to efficiently, and effectively perform haircare, and self-grooming tasks. 12/09/2021: right shoulder 4-/5, left shoulder 4/5. Pt. has improved to be able to reach up for hair care, and self-grooming tasks. 10/28/2021: Right shoulder 3+/5, Left shoulder 4-/5 09/14/2021:  BUE shoulder strength: 3+/5 for shoulder flexion, and abduction.   Time 12    Period Weeks    Status Achieved               OT LONG TERM GOAL #3   Title Patient will demonstrate improved composite finger flexion to hold deodorant to apply to underarms.    Baseline 02/03/2022: Pt. Has improved with digit flexion, however continues to have difficulty securely holding and using deoderant. 12/09/2021: Bilateral hand/digit MP, PIP, and DIP extension tightness limits her ability achieve digit flexion to hold, and apply deodorant. 10/28/2021:  Pt. Continues to have difficulty holding the deodorant. 01/12/2021: Pt. Presents with limited digit extension. Pt. Is able to initiate holding deodorant, however is unable to hold it while using it.   Time 12    Period Weeks    Status Ongoing   Target Date                  OT LONG TERM GOAL #4   Title Pt. Will improve bilateral wrist extension in preparation for anticipating, and initiating reaching for objects at the table.    Baseline 02/03/2022: Right: 34(55) Left: 3(4) 12/09/2021: Right  34(55)  Left  3(4) 10/28/2021:  Right 22(38), Left 0(15) 09/14/2021:  Right: 17(35), left 2(15)   Time 12   Period weeks   Status Ongoing   Target Date      OT LONG TERM GOAL #5   Title Pt. will write, and sign her name with 100% legibility, and modified independence.    Baseline 02/03/2022: Pt. Continues to report legibility appears "shaky" when attempting signing her name. Pt.reports that she is writing more when filling out her menu, and complete puzzles.12/09/2021: Pt. Reports legibility appears "shaky" when  attempting signing her name. Pt.reports that she is writing more when filling out her menu. 10/28/2021: pt. is able to sign her name, however legibility continues to be inconsistent. Pt. Reports "shaky appearance". 09/14/2021: Pt. continues to fill out her daily menu and complete puzzles. Pt. Continues to present with limited writing legibility.   Time 12    Period Weeks    Status  Ongoing         OT LONG TERM GOAL  #6   TITLE Pt. will turn pages in a book with modified independence.    Baseline 02/03/2022: Pt. Is able to  effectively turn pages of a book. 10/28/2021: Pt. Is able to consistently turn book pages. 09/14/2021: Pt. has difficulty turning pages.    Time 12    Period Weeks    Status Achieved       OT LONG TERM GOAL  #7  TITLE Pt. Will increase  bilateral lateral pinch strength by 2 lbs to be able to securely grasp items during ADLs, and IADL tasks.   Baseline 02/03/2022: Right: 6#, Left: 4# 12/09/2021:  Right: 6#, Left: 4#   Time 12  Period Weeks  Status Ongoing           OT LONG TERM GOAL  #8  TITLE Pt. Will button a sweater with large 1" buttons, and zip her jacket with modified independence.   Baseline 02/03/2022: Pt. is unable to manipulate buttons on her sweater, or zippers on her jacket.  Time 12  Period Weeks  Status  New   OT LONG TERM GOAL  #9  TITLE Pt. will complete plant care with modified independence.  Baseline 02/03/2022: Pt. has difficulty caring for her plants.   Time 12  Period Weeks  Status New   OT LONG TERM GOAL  #10  TITLE Pt. will demonstrate adaptive techniques to assist with the efficiency of self-dressing, or morning care tasks.  Baseline 02/03/2022: Pt. Requires assist from caregivers 2/2 time limitations during morning care.   Time 12  Period Weeks  Status New     Plan - 07/22/21 1737     Clinical Impression Statement Pt. continues to make progress with bilateral UE strength, and continues to try to engage her bilateral hands more during  daily ADL, and IADL tasks. Pt. Tolerated addition of the dowel, and dumbbell exercises. Pt. Continues to tolerate stretches and manual therapy well, without pain. Pt. continues to present with tightness at the bilateral thumb webspace, and bilateral wrist flexor tightness, as well as digit MP, PIP, and DIP extensor tightness which continues to limit her ability to complete ADL tasks efficiently. Pt. continues to benefit from working on impoving ROM in order to work towards increasing bilateral hand grasp on objects, and increase engagement of her bilateral hands during ADLs, and IADL tasks                   OT Occupational Profile and History Detailed Assessment- Review of Records and additional review of physical, cognitive, psychosocial history related to current functional performance    Occupational performance deficits (Please refer to evaluation for details): ADL's;IADL's;Leisure;Social Participation    Body Structure / Function / Physical Skills ADL;Continence;Dexterity;Flexibility;Strength;ROM;Balance;Coordination;FMC;IADL;Endurance;UE functional use;Decreased knowledge of use of DME;GMC    Psychosocial Skills Coping Strategies    Rehab Potential Fair    Clinical Decision Making Several treatment options, min-mod task modification necessary    Comorbidities Affecting Occupational Performance: Presence of comorbidities impacting occupational performance    Comorbidities impacting occupational performance description: contractures of bilateral hands, dependent transfers,    Modification or Assistance to Complete Evaluation  No modification of tasks or assist necessary to complete eval    OT Frequency 2x / week    OT Duration 12 weeks    OT Treatment/Interventions Self-care/ADL training;Cryotherapy;Therapeutic exercise;DME and/or AE instruction;Balance training;Neuromuscular education;Manual Therapy;Splinting;Moist Heat;Passive range of motion;Therapeutic activities;Patient/family education     Plan continue to progress ROM of digits, wrists and shoulders as it pertains to completion of ADL tasks that pt values.    Consulted and Agree with Plan of Care Patient  Harrel Carina, MS, OTR/L  Harrel Carina, OT 03/31/2022, 12:21 PM      `

## 2022-03-31 NOTE — Therapy (Signed)
OUTPATIENT PHYSICAL THERAPY NEURO TREATMENT  Patient Name: Sherry [name] MRN: [id number] DOB:April 18, 1934, 88 y.o., female Today's Date: 04/01/2022   PCP: Dewayne Shorter, MD REFERRING PROVIDER: Earnestine Leys   PT End of Session - 03/31/22 1113     Visit Number 32    Number of Visits 43    Date for PT Re-Evaluation 04/05/22    Authorization Time Period Initial cert [id number]; Recert [id number]    Progress Note Due on Visit 75    PT Start Time [id number]    PT Stop Time 1225    PT Time Calculation (min) 40 min    Equipment Utilized During Treatment Gait belt    Activity Tolerance Patient tolerated treatment well    Behavior During Therapy WFL for tasks assessed/performed                          Past Medical History:  Diagnosis Date   Acute blood loss anemia    Arthritis    Cancer ([place])    skin   Central cord syndrome at C6 level of cervical spinal cord ([place]) 11/29/2017   Hypertension    Protein-calorie malnutrition, severe ([place]) 01/24/2018   S/P insertion of IVC (inferior vena caval) filter 01/24/2018   Tetraparesis ([place])    Past Surgical History:  Procedure Laterality Date   ANTERIOR CERVICAL DECOMP/DISCECTOMY FUSION N/A 11/29/2017   Procedure: Cervical five-six, six-seven Anterior Cervical Decompression Fusion;  Surgeon: Judith Part, MD;  Location: [facility];  Service: Neurosurgery;  Laterality: N/A;  Cervical five-six, six-seven Anterior Cervical Decompression Fusion   CATARACT EXTRACTION     FEMUR IM NAIL Left 08/16/2021   Procedure: INTRAMEDULLARY (IM) NAIL FEMORAL;  Surgeon: Earnestine Leys, MD;  Location: [facility];  Service: Orthopedics;  Laterality: Left;   IR IVC FILTER PLMT / S&I /IMG GUID/MOD SED  12/08/2017   Patient Active Problem List   Diagnosis Date Noted   Hypertensive heart disease with other congestive heart failure ([place]) 02/14/2022   Chronic indwelling Foley catheter 02/14/2022   Closed hip fracture ([place])  08/15/2021   DVT (deep venous thrombosis) ([place]) 08/15/2021   Quadriplegia ([place]) 08/15/2021   Fall 08/15/2021   Constipation due to slow transit 08/31/2018   Trauma 06/05/2018   Neuropathic pain 06/05/2018   Neurogenic bowel 06/05/2018   Vaginal yeast infection 01/30/2018   Healthcare-associated pneumonia 01/25/2018   Chronic allergic rhinitis 01/24/2018   Depression with anxiety 01/24/2018   UTI due to Klebsiella species 01/24/2018   Protein-calorie malnutrition, severe ([place]) 01/24/2018   S/P insertion of IVC (inferior vena caval) filter 01/24/2018   Tetraparesis ([place]) 01/20/2018   Neurogenic bladder 01/20/2018   Reactive depression    Benign essential HTN    Acute postoperative anemia due to expected blood loss    Central cord syndrome at C6 level of cervical spinal cord ([place]) 11/29/2017   Allergy to alpha-gal 11/25/2016   SCC (squamous cell carcinoma) 04/08/2014    ONSET DATE: 08/15/2021 (fall with B hip fx); Initial injury was in 2019- quadraplegia due to central cord syndrom of C6 secondary to neck fracture.   REFERRING DIAG: Bilateral Hip fx; Left Open- s/p Left IM nail femoral on 08/16/2021; Right closed  THERAPY DIAG:  Muscle weakness (generalized)  Other lack of coordination  Abnormality of gait and mobility  Difficulty in walking, not elsewhere classified  Other abnormalities of gait and mobility  Closed fracture of both hips, initial encounter (HCC)  Unsteadiness on feet  Central  cord syndrome, sequela (HCC)  Rationale for Evaluation and Treatment Rehabilitation  SUBJECTIVE:   Patient reports feeling rough again today and not sleeping well. States returns to MD on 04/05/2022 with follow up.                                                                                                                                                                                            SUBJECTIVE STATEMENT:    Patient reports feeling tired today. States still recovering  from illness (UTI) and states not sleeping well at night right now.    Pt accompanied by:  Paid caregiver-   PERTINENT HISTORY: Patient was previous patient (see previous episode for specifics) She experienced a fall at home on 08/15/2021 and was in [facility] from 08/15/2021- 08/20/2021.  Most recent history per Dr. Laurey Arrow on 08/20/2021.  Sherry [name] is a 88 y.o. 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   Patient has history of quadriplegia, and has been doing PT/OT with improvement of function. Pt has some use of both arms and can feed herself. She can walk a little bit with assistance. Pt accidentally fell at about 930 this morning after taking shower.  She injured her hips. Hospital Course:    Closed left hip fracture [facility]) s/p mechanical fall Closed right hip fracture (?old) Acute blood loss anemia --pt is s/p  INTRAMEDULLARY (IM) NAIL FEMORAL-left on 7/16  PAIN:  Are you having pain? NO: NPRS scale: 0/10 Pain location: Right hip Pain description: Ache Aggravating factors: any active Movement of right side Relieving factors: Rest  PRECAUTIONS: Fall  WEIGHT BEARING RESTRICTIONS Yes Bilateral non-weight bearing B LE's  FALLS: Has patient fallen in last 6 months? Yes. Number of falls 1  LIVING ENVIRONMENT: Lives with: lives with their family and lives in a nursing home Lives in: Other Long term care at [place]: No Has following equipment at home: Wheelchair (power), Ramped entry, and hoyer lift  PLOF: Needs assistance with ADLs, Needs assistance with homemaking, Needs assistance with gait, Needs assistance with transfers, and Leisure: Work puzzles, read, Watch TV  PATIENT GOALS Patient goal is to walk; Be able to strengthen my arms for  better use  OBJECTIVE:   DIAGNOSTIC FINDINGS: Overall cognitive statusEXAM: DG HIP (WITH OR WITHOUT PELVIS) 2-3V LEFT; DG HIP (WITH OR WITHOUT PELVIS) 2-3V RIGHT   COMPARISON:  CT of the abdomen and pelvis 01/21/2020  FINDINGS: Left proximal femur fracture noted just below the intertrochanteric region. Marked ventral angulation noted on the cross-table image. Slight varus angulation present   Comminuted intratrochanteric fracture present on the right also with marked ventral angulation.   IMPRESSION: 1. Bilateral hip fractures. 2. Comminuted intertrochanteric fracture of the right hip. 3. Angulated fracture of the proximal left femur just below the intertrochanteric region.     Electronically Signed   By: San Morelle M.D.   On: 08/15/2021 11:54:   COGNITION:  Within functional limits for tasks assessed   SENSATION: Light touch: Impaired   EDEMA:  None  Strength R/L 2-/2- Shoulder flexion (anterior deltoid/pec major/coracobrachialis, axillary n. (C5-6) and musculocutaneous n. (C5-7)) 2-/3+ (within available ROM around 130 on lef)Shoulder abduction (deltoid/supraspinatus, axillary/suprascapular n, C5) 3+/3+ Shoulder external rotation (infraspinatus/teres minor) 4/4 Shoulder internal rotation (subcapularis/lats/pec major) 3+/3+ Shoulder extension (posterior deltoid, lats, teres major, axillary/thoracodorsal n.) 3+/3+ Shoulder horizontal abduction 4/4 Elbow flexion (biceps brachii, brachialis, brachioradialis, musculoskeletal n, C5-6) 4/4 Elbow extension (triceps, radial n, C7) 2-/2- Wrist Extension 3+/3+ Wrist Flexion   LOWER EXTREMITY MMT:    MMT Right 01/11/22 Left 01/11/22  Hip flexion 2- 2-  Hip extension 2- 2-  Hip abduction 2- 2-  Hip adduction 2- 3  Hip internal rotation 2- 2-  Hip external rotation 2- 2-  Knee flexion 2- 2-  Knee extension 2+ 3+  Ankle dorsiflexion 2- NT - boot  Ankle plantarflexion 2- NT- boot  Ankle inversion 2- NT-  boot  Ankle eversion 2- NT- boot  (Blank rows = not tested)  BED MOBILITY:  Unable to test due to NWB status of BLE- Patient currently dependent with all bed mobility using hoyer and caregiver assist  TRANSFERS: Currently BLE NWB- Dependent on use of hoyer for all transfers  FUNCTIONAL TESTs:   PATIENT SURVEYS:  FOTO 12  TODAY'S TREATMENT:  THERE.EX:    AAROM- B hip flex 2 sets x 15 reps    AAROM- R LAQ - 2 sets of 15 reps    AROM- Left LAQ- 1 sets of 15 reps and then progressed to 4lb. Left LE another set of 10 reps   Seated hip adduction squeeze on red ball - hold 5 sec - 2 sets of 10 reps   Straight leg hip abd with PT holding heel for support - 2 sets of 15 reps each LE   Resistive B knee flexion with GTB 2sets x 15 reps each LE.    Manual resistive Leg press (seated)  2 sets x 10 reps each LE    Pt educated throughout session about proper posture and technique with exercises. Improved exercise technique, movement at target joints, use of target muscles after min to mod verbal, visual, tactile cues.   PATIENT EDUCATION: Education details: PT plan of care/use of resistive bands Person educated: Patient Education method: Explanation, Demonstration, Tactile cues, and Verbal cues Education comprehension: verbalized understanding, returned demonstration, verbal cues required, tactile cues required, and needs further education   HOME EXERCISE PROGRAM: Discussed use of theraband and demo shoulder flex/abd and elbow flex and ext with use of yellow TB. Patient required assist to position therband but able to perform well with only one person assist  Access Code: [facility] URL: [email or web address] Date: 11/23/2021 Prepared by: Janna Arch Exercises - Seated Gluteal Sets - 2 x daily - 7 x weekly - 2 sets - 10 reps - 5 hold - Seated Quad Set - 2 x daily - 7 x weekly - 2 sets -  10 reps - 5 hold - Seated Long Arc Quad - 2 x daily - 7 x weekly - 2 sets - 10 reps - 5 hold  - Seated March - 2 x daily - 7 x weekly - 2 sets - 10 reps - 5 hold - Seated Hip Abduction - 2 x daily - 7 x weekly - 2 sets - 10 reps - 5 hold - Seated Shoulder Shrugs - 2 x daily - 7 x weekly - 2 sets - 10 reps - 5 hold - Wheelchair Pressure Relief - 2 x daily - 7 x weekly - 2 sets - 10 reps - 5 hold  GOALS: Goals reviewed with patient? Yes  SHORT TERM GOALS: Target date: 02/22/2022  Pt will be independent with initial UE strengthening HEP in order to improve strength and balance in order to decrease fall risk and improve function at home and work. Baseline: 10/19/2021- patient with no formal UE HEP; 12/02/2021= Patient verbalized knowledge of HEP including use of theraband for UE strengthening.  Goal status: GOAL MET  LONG TERM GOALS: Target date: 04/05/2022  Pt will be independent with final for UE/LE HEP in order to improve strength and balance in order to decrease fall risk and improve function at home and work. Baseline: Patient is currently BLE NWB and unable to participate in HEP. Has order for UE strengthening. 01/11/2022- Patient now able to participate in LE strengthening although NWB still- good understanding for some basic exercises. Will keep goal active to incorporate progressive LE strengthening exercises. Goal status: Progressing  2.  Pt will improve FOTO to target score of 35  to display perceived improvements in ability to complete ADL's.  Baseline: 10/19/2021= 12; 12/02/2021= 17; 01/11/2022=17 Goal status: Ongoing  3.  Pt will increase strength of B UE  by at least 1/2 MMT grade in order to demonstrate improvement in strength and function  Baseline: patient has range of 2-/5 to 4/5 BUE Strength; 12/02/2021= 4/5 except with wrist ext. 01/11/2022= 4/5 B UE strength expept for wrist Goal status: Partially met  4. Pt. Will increase strength of RLE by at least 1/2 MMT grade in order to demonstrate improvement in Standing/transfers.  Baseline: 2-/5 Right hip flex/knee ext/flex  Goal  status: Initial  5. Pt. Will demo ability to stand pivot transfer with max assist for improved functional mobility and less dependent need on mechanical device.   Baseline: Dependent on hoyer lift for all transfers  Goal status: Initial  6. Pt. Will demonstrate improved functional LE strength as seen by ability to stand > 2 min for improved transfer ability and pregait abilities.   Baseline: not assessed today due to recent dx: UTI  Goal status: initial  ASSESSMENT:  CLINICAL IMPRESSION:  Treatment continues to be limited secondary to patient not feeling well enough to attempt standing. She did perform fairly well but fatigued with manual resistance and weights on left LE. She was also not able to achieve as much knee ext on right LE as previous. She remains well motivated despite not feeling great today and responsive to all VC.  Pt will continue to benefit from skilled PT services to improve strength in anticipation for BLE clearance for [place] activities.    OBJECTIVE IMPAIRMENTS Abnormal gait, decreased activity tolerance, decreased balance, decreased coordination, decreased endurance, decreased mobility, difficulty walking, decreased ROM, decreased strength, hypomobility, impaired flexibility, impaired UE functional use, postural dysfunction, and pain.   ACTIVITY LIMITATIONS carrying, lifting, bending, sitting, standing, squatting, sleeping, stairs, transfers, bed  mobility, continence, bathing, toileting, dressing, self feeding, reach over head, hygiene/grooming, and caring for others  PARTICIPATION LIMITATIONS: meal prep, cleaning, laundry, medication management, personal finances, interpersonal relationship, driving, shopping, community activity, and yard work  PERSONAL FACTORS Age, Time since onset of injury/illness/exacerbation, and 1-2 comorbidities: HTN, cervical Sx  are also affecting patient's functional outcome.   REHAB POTENTIAL: Good  CLINICAL DECISION MAKING:  Evolving/moderate complexity  EVALUATION COMPLEXITY: Moderate  PLAN: PT FREQUENCY: 1-2x/week  PT DURATION: 12 weeks  PLANNED INTERVENTIONS: Therapeutic exercises, Therapeutic activity, Neuromuscular re-education, Balance training, Gait training, Patient/Family education, Self Care, Joint mobilization, DME instructions, Dry Needling, Electrical stimulation, Wheelchair mobility training, Spinal mobilization, Cryotherapy, Moist heat, and Manual therapy  PLAN FOR NEXT SESSION: Continue with weight bearing activities and LE strengthening as appropriate.  Ollen Bowl, PT Physical Therapist- [facility]  04/01/2022, 8:05 AM

## 2022-04-01 ENCOUNTER — Ambulatory Visit: Payer: Medicare HMO | Admitting: Occupational Therapy

## 2022-04-05 ENCOUNTER — Ambulatory Visit: Payer: Medicare HMO | Attending: Internal Medicine | Admitting: Occupational Therapy

## 2022-04-05 ENCOUNTER — Ambulatory Visit: Payer: Medicare HMO

## 2022-04-05 DIAGNOSIS — M6281 Muscle weakness (generalized): Secondary | ICD-10-CM

## 2022-04-05 DIAGNOSIS — R2681 Unsteadiness on feet: Secondary | ICD-10-CM | POA: Insufficient documentation

## 2022-04-05 DIAGNOSIS — R269 Unspecified abnormalities of gait and mobility: Secondary | ICD-10-CM

## 2022-04-05 DIAGNOSIS — S72001A Fracture of unspecified part of neck of right femur, initial encounter for closed fracture: Secondary | ICD-10-CM | POA: Diagnosis present

## 2022-04-05 DIAGNOSIS — R262 Difficulty in walking, not elsewhere classified: Secondary | ICD-10-CM

## 2022-04-05 DIAGNOSIS — S14129S Central cord syndrome at unspecified level of cervical spinal cord, sequela: Secondary | ICD-10-CM | POA: Diagnosis present

## 2022-04-05 DIAGNOSIS — R278 Other lack of coordination: Secondary | ICD-10-CM | POA: Diagnosis present

## 2022-04-05 DIAGNOSIS — R2689 Other abnormalities of gait and mobility: Secondary | ICD-10-CM | POA: Diagnosis present

## 2022-04-05 DIAGNOSIS — S72002A Fracture of unspecified part of neck of left femur, initial encounter for closed fracture: Secondary | ICD-10-CM | POA: Insufficient documentation

## 2022-04-05 NOTE — Therapy (Signed)
Occupational Therapy Treatment Note    Patient Name: Sherry [name] MRN: [id number] DOB:03/15/1934, 88 y.o., female Today's Date: 04/05/2022                                         PCP: Dr. Viviana Simpler REFERRING PROVIDER: Dr. Viviana Simpler   OT End of Session - 04/05/22 1226     Visit Number [id number]    Number of Visits 63    Date for OT Re-Evaluation 04/28/22    Authorization Time Period Progress report period starting 12/14/2021    OT Start Time 1103    OT Stop Time 1145    OT Time Calculation (min) 42 min    Equipment Utilized During Treatment moist heat    Activity Tolerance Patient tolerated treatment well    Behavior During Therapy WFL for tasks assessed/performed                 Past Medical History:  Diagnosis Date   Acute blood loss anemia    Arthritis    Cancer ([place])    skin   Central cord syndrome at C6 level of cervical spinal cord ([place]) 11/29/2017   Hypertension    Protein-calorie malnutrition, severe ([place]) 01/24/2018   S/P insertion of IVC (inferior vena caval) filter 01/24/2018   Tetraparesis ([place])    Past Surgical History:  Procedure Laterality Date   ANTERIOR CERVICAL DECOMP/DISCECTOMY FUSION N/A 11/29/2017   Procedure: Cervical five-six, six-seven Anterior Cervical Decompression Fusion;  Surgeon: Judith Part, MD;  Location: [facility];  Service: Neurosurgery;  Laterality: N/A;  Cervical five-six, six-seven Anterior Cervical Decompression Fusion   CATARACT EXTRACTION     FEMUR IM NAIL Left 08/16/2021   Procedure: INTRAMEDULLARY (IM) NAIL FEMORAL;  Surgeon: Earnestine Leys, MD;  Location: [facility];  Service: Orthopedics;  Laterality: Left;   IR IVC FILTER PLMT / S&I /IMG GUID/MOD SED  12/08/2017   Patient Active Problem List   Diagnosis Date Noted   Hypertensive heart disease with other congestive heart failure ([place]) 02/14/2022   Chronic indwelling Foley catheter 02/14/2022   Closed hip fracture ([place]) 08/15/2021   DVT (deep venous thrombosis)  ([place]) 08/15/2021   Quadriplegia ([place]) 08/15/2021   Fall 08/15/2021   Constipation due to slow transit 08/31/2018   Trauma 06/05/2018   Neuropathic pain 06/05/2018   Neurogenic bowel 06/05/2018   Vaginal yeast infection 01/30/2018   Healthcare-associated pneumonia 01/25/2018   Chronic allergic rhinitis 01/24/2018   Depression with anxiety 01/24/2018   UTI due to Klebsiella species 01/24/2018   Protein-calorie malnutrition, severe ([place]) 01/24/2018   S/P insertion of IVC (inferior vena caval) filter 01/24/2018   Tetraparesis ([place]) 01/20/2018   Neurogenic bladder 01/20/2018   Reactive depression    Benign essential HTN    Acute postoperative anemia due to expected blood loss    Central cord syndrome at C6 level of cervical spinal cord ([place]) 11/29/2017   Allergy to alpha-gal 11/25/2016   SCC (squamous cell carcinoma) 04/08/2014    REFERRING DIAG: Central Cord Syndrome at C6 of the Cervical Spinal Cord, Fall with Bilateral Closed Hip Fractures, with ORIF repair with Intramedullary Nailing of the Left Hip Fracture.  THERAPY DIAG:  Muscle weakness (generalized)  Rationale for Evaluation and Treatment Rehabilitation  PERTINENT HISTORY: Patient s/p fall November 29, 2017 resulting in diagnosis of central cord syndrome at C 6 level.  She has had therapy  in multiple venues but with recent move to [facility], therapy staff recommended she seek outpatient therapy for her needs. Pt. Had a recent fall with Bilateral Closed Hip Fractures, with ORIF repair with Intramedullary Nailing of the Left Hip Fracture.   PRECAUTIONS: None  SUBJECTIVE:   Pt. reports doing well today.  PAIN:  Are you having pain? No     OBJECTIVE:   TODAY'S TREATMENT:   Manual Therapy:   Pt. tolerated soft tissue massage to the volar, and dorsal surface of each digit on the bilateral hands secondary to stiffness following moist heat modality. Soft tissue mobilization was performed for Carpal, and metacarpal spread  stretches for the left hand only.(Dermatological surgical site at the dorsal of the right hand still healing with a bandage present.) Manual therapy was performed independent of, and in preparation for therapeutic Ex.   Therapeutic Ex.    Pt. tolerated PROM followed by AROM bilateral wrist extension, PROM for bilateral digit MP, PIP, and DIP flexion, and extension, thumb radial, and palmar abduction.Pt. worked on bilateral shoulder flexion, chest press, and elbow flexion/extension, as well as adding a combination of movements. Pt. Worked on BUE strengthening, and reciprocal motion using the UBE while seated for 8 min. with minimal resistance. Constant monitoring was provided.    Pt. continues to make progress with bilateral UE strength, and continues to try to engage her bilateral hands more during daily ADL, and IADL tasks. Pt. continues to tolerate stretches and manual therapy well, without pain. Pt. continues to present with tightness at the bilateral thumb webspace, and bilateral wrist flexor tightness, as well as digit MP, PIP, and DIP extensor tightness which continues to limit her ability to complete ADL tasks efficiently. Pt. continues to benefit from working on impoving ROM in order to work towards increasing bilateral hand grasp on objects, and increase engagement of her bilateral hands during ADLs, and IADL tasks.   PATIENT EDUCATION:  Education details: BUE ROM, hand function, UE functioning Person educated: Patient Education method: Explanation and Demonstration Education comprehension: verbalized understanding, returned demonstration, and needs further education   HOME EXERCISE PROGRAM Continue with ongoing HEPs  Measurements:  12/09/2021:  Shoulder flexion: R: 120(136), L: 138(150) Shoulder abduction: R: 108(120), L: 110(120) Elbow: R: 0-148, L: 0-146 Wrist flexion: R: 55(62), L: 78 Writ extension: R: 34(55), L: 3(4) RD: R: 12(20), L: 16(26) UD: R: 8(24), L: 6(18) Thumb  radial abduction: R: 11(20), L: 8(20) Digit flexion to the [facility]: R:  2nd: 5cm(3.5cm), 3rd: 7.5 cm(5cm), 4th: 7cm(3cm), 5th: 6cm(5.5cm) L: 2nd: 8cm(8cm), 3rd: 8cm(8cm), 4th: 7.5cm(7cm), 5th: 7cm(7cm)  02/03/2022:   Shoulder flexion: R: 122(138), L: 148(154) Shoulder abduction: R: 109(125), L: 125(125 Elbow: R: 0-148, L: 0-146 Wrist flexion: R: 60(68), L: 79 Writ extension: R: 40(55), L: 3(10) RD: R: 14(24), L: 20(28) UD: R: 8(24), L: 6(18) Thumb radial abduction: R: 20(25), L: 8(20) Digit flexion to the [facility]: R:  2nd: 4.5cm(3cm), 3rd: 6.5cm(4.5cm), 4th: 6.5 cm(3cm), 5th: 4.5cm(5cm) L: 2nd: 8cm(8cm), 3rd: 8cm(7cm), 4th: 7.5cm(7cm), 5th: 6.5cm(6.5cm)            OT Long Term Goals - 07/06/21 1106  Target Date: 04/28/2022    OT LONG TERM GOAL #1   Title Patient and caregiver will demonstrate understanding of home exercise program for ROM.    Baseline 12/09/2021: Pt. Reports the restorative ROM program has not resumed at the facility. 10/28/2021: Pt. Is attempting exercises/stretches at home. The facility restorative aides have not resumed exercises. 09/14/2021:  Pt. Reports inconsistencies with restorative ROM at  her care home. Pt/caregiver education to be provided about ongoing ROM needs.    Time 12    Period Weeks    Status Deferred     OT LONG TERM GOAL #2   Title Pt.  will improve BUE strength to be able to sustain her UE's in elevation to efficiently, and effectively perform hair care, and self-grooming tasks.   Baseline 02/03/2022: Pt. has improved, and is now able to sustain her Bilateral UEs in elevation long enough to efficiently, and effectively perform haircare, and self-grooming tasks. 12/09/2021: right shoulder 4-/5, left shoulder 4/5. Pt. has improved to be able to reach up for hair care, and self-grooming tasks. 10/28/2021: Right shoulder 3+/5, Left shoulder 4-/5 09/14/2021:  BUE shoulder  strength: 3+/5 for shoulder flexion, and abduction.   Time 12    Period Weeks    Status Achieved               OT LONG TERM GOAL #3   Title Patient will demonstrate improved composite finger flexion to hold deodorant to apply to underarms.    Baseline 02/03/2022: Pt. Has improved with digit flexion, however continues to have difficulty securely holding and using deoderant. 12/09/2021: Bilateral hand/digit MP, PIP, and DIP extension tightness limits her ability achieve digit flexion to hold, and apply deodorant. 10/28/2021:  Pt. Continues to have difficulty holding the deodorant. 01/12/2021: Pt. Presents with limited digit extension. Pt. Is able to initiate holding deodorant, however is unable to hold it while using it.   Time 12    Period Weeks    Status Ongoing   Target Date                  OT LONG TERM GOAL #4   Title Pt. Will improve bilateral wrist extension in preparation for anticipating, and initiating reaching for objects at the table.    Baseline 02/03/2022: Right: 34(55) Left: 3(4) 12/09/2021: Right  34(55)  Left  3(4) 10/28/2021:  Right 22(38), Left 0(15) 09/14/2021:  Right: 17(35), left 2(15)   Time 12   Period weeks   Status Ongoing   Target Date      OT LONG TERM GOAL #5   Title Pt. will write, and sign her name with 100% legibility, and modified independence.    Baseline 02/03/2022: Pt. Continues to report legibility appears "shaky" when attempting signing her name. Pt.reports that she is writing more when filling out her menu, and complete puzzles.12/09/2021: Pt. Reports legibility appears "shaky" when attempting signing her name. Pt.reports that she is writing more when filling out her menu. 10/28/2021: pt. is able to sign her name, however legibility continues to be inconsistent. Pt. Reports "shaky appearance". 09/14/2021: Pt. continues to fill out her daily menu and complete puzzles. Pt. Continues to present with limited writing legibility.   Time 12    Period Weeks     Status  Ongoing         OT LONG TERM GOAL  #6   TITLE Pt. will turn pages in a book with modified independence.    Baseline 02/03/2022: Pt. Is able to  effectively turn pages of a book. 10/28/2021: Pt. Is able to consistently turn book pages. 09/14/2021: Pt. has difficulty turning pages.    Time 12    Period Weeks    Status Achieved       OT LONG TERM GOAL  #7  TITLE Pt. Will increase  bilateral lateral pinch strength by 2 lbs to be able to securely grasp items during ADLs, and IADL tasks.   Baseline 02/03/2022: Right: 6#, Left: 4# 12/09/2021:  Right: 6#, Left: 4#   Time 12  Period Weeks  Status Ongoing           OT LONG TERM GOAL  #8  TITLE Pt. Will button a sweater with large 1" buttons, and zip her jacket with modified independence.   Baseline 02/03/2022: Pt. is unable to manipulate buttons on her sweater, or zippers on her jacket.  Time 12  Period Weeks  Status  New   OT LONG TERM GOAL  #9  TITLE Pt. will complete plant care with modified independence.  Baseline 02/03/2022: Pt. has difficulty caring for her plants.   Time 12  Period Weeks  Status New   OT LONG TERM GOAL  #10  TITLE Pt. will demonstrate adaptive techniques to assist with the efficiency of self-dressing, or morning care tasks.  Baseline 02/03/2022: Pt. Requires assist from caregivers 2/2 time limitations during morning care.   Time 12  Period Weeks  Status New     Plan - 07/22/21 1737     Clinical Impression Statement Pt. continues to make progress with bilateral UE strength, and continues to try to engage her bilateral hands more during daily ADL, and IADL tasks. Pt. continues to tolerate stretches and manual therapy well, without pain. Pt. continues to present with tightness at the bilateral thumb webspace, and bilateral wrist flexor tightness, as well as digit MP, PIP, and DIP extensor tightness which continues to limit her ability to complete ADL tasks efficiently. Pt. continues to benefit from working on  impoving ROM in order to work towards increasing bilateral hand grasp on objects, and increase engagement of her bilateral hands during ADLs, and IADL tasks.                     OT Occupational Profile and History Detailed Assessment- Review of Records and additional review of physical, cognitive, psychosocial history related to current functional performance    Occupational performance deficits (Please refer to evaluation for details): ADL's;IADL's;Leisure;Social Participation    Body Structure / Function / Physical Skills ADL;Continence;Dexterity;Flexibility;Strength;ROM;Balance;Coordination;FMC;IADL;Endurance;UE functional use;Decreased knowledge of use of DME;[facility]    Psychosocial Skills Coping Strategies    Rehab Potential Fair    Clinical Decision Making Several treatment options, min-mod task modification necessary    Comorbidities Affecting Occupational Performance: Presence of comorbidities impacting occupational performance    Comorbidities impacting occupational performance description: contractures of bilateral hands, dependent transfers,    Modification or Assistance to Complete Evaluation  No modification of tasks or assist necessary to complete eval    OT Frequency 2x / week    OT Duration 12 weeks    OT Treatment/Interventions Self-care/ADL training;Cryotherapy;Therapeutic exercise;DME and/or AE instruction;Balance training;Neuromuscular education;Manual Therapy;Splinting;Moist Heat;Passive range of motion;Therapeutic activities;Patient/family education    Plan continue to progress ROM of digits, wrists and shoulders as it pertains to completion of ADL tasks that pt values.    Consulted and Agree with Plan of Care Patient            Harrel Carina, MS,  OTR/L  Harrel Carina, OT 04/05/2022, 12:28 PM      `

## 2022-04-05 NOTE — Therapy (Signed)
OUTPATIENT PHYSICAL THERAPY NEURO TREATMENT  Patient Name: Sherry [name] MRN: [id number] DOB:22-Dec-1934, 88 y.o., female Today's Date: 04/05/2022   PCP: Sherry Shorter, MD REFERRING PROVIDER: Earnestine [name]   PT End of Session - 04/05/22 1151     Visit Number 33    Number of Visits 43    Date for PT Re-Evaluation 04/05/22    Authorization Time Period Initial cert [id number]; Recert [id number]    Progress Note Due on Visit 22    PT Start Time 1147    PT Stop Time 1222    PT Time Calculation (min) 35 min    Equipment Utilized During Treatment Gait belt    Activity Tolerance Patient tolerated treatment well    Behavior During Therapy WFL for tasks assessed/performed                          Past Medical History:  Diagnosis Date   Acute blood loss anemia    Arthritis    Cancer ([place])    skin   Central cord syndrome at C6 level of cervical spinal cord ([place]) 11/29/2017   Hypertension    Protein-calorie malnutrition, severe ([place]) 01/24/2018   S/P insertion of IVC (inferior vena caval) filter 01/24/2018   Tetraparesis ([place])    Past Surgical History:  Procedure Laterality Date   ANTERIOR CERVICAL DECOMP/DISCECTOMY FUSION N/A 11/29/2017   Procedure: Cervical five-six, six-seven Anterior Cervical Decompression Fusion;  Surgeon: Sherry Part, MD;  Location: [facility];  Service: Neurosurgery;  Laterality: N/A;  Cervical five-six, six-seven Anterior Cervical Decompression Fusion   CATARACT EXTRACTION     FEMUR IM NAIL Left 08/16/2021   Procedure: INTRAMEDULLARY (IM) NAIL FEMORAL;  Surgeon: Sherry Leys, MD;  Location: [facility];  Service: Orthopedics;  Laterality: Left;   IR IVC FILTER PLMT / S&I /IMG GUID/MOD SED  12/08/2017   Patient Active Problem List   Diagnosis Date Noted   Hypertensive heart disease with other congestive heart failure ([place]) 02/14/2022   Chronic indwelling Foley catheter 02/14/2022   Closed hip fracture ([place])  08/15/2021   DVT (deep venous thrombosis) ([place]) 08/15/2021   Quadriplegia ([place]) 08/15/2021   Fall 08/15/2021   Constipation due to slow transit 08/31/2018   Trauma 06/05/2018   Neuropathic pain 06/05/2018   Neurogenic bowel 06/05/2018   Vaginal yeast infection 01/30/2018   Healthcare-associated pneumonia 01/25/2018   Chronic allergic rhinitis 01/24/2018   Depression with anxiety 01/24/2018   UTI due to Klebsiella species 01/24/2018   Protein-calorie malnutrition, severe ([place]) 01/24/2018   S/P insertion of IVC (inferior vena caval) filter 01/24/2018   Tetraparesis ([place]) 01/20/2018   Neurogenic bladder 01/20/2018   Reactive depression    Benign essential HTN    Acute postoperative anemia due to expected blood loss    Central cord syndrome at C6 level of cervical spinal cord ([place]) 11/29/2017   Allergy to alpha-gal 11/25/2016   SCC (squamous cell carcinoma) 04/08/2014    ONSET DATE: 08/15/2021 (fall with B hip fx); Initial injury was in 2019- quadraplegia due to central cord syndrome of C6 secondary to neck fracture.   REFERRING DIAG: Bilateral Hip fx; Left Open- s/p Left IM nail femoral on 08/16/2021; Right closed  THERAPY DIAG:  Muscle weakness (generalized)  Other lack of coordination  Abnormality of gait and mobility  Difficulty in walking, not elsewhere classified  Other abnormalities of gait and mobility  Closed fracture of both hips, initial encounter (HCC)  Unsteadiness on feet  Central  cord syndrome, sequela ([place])  Rationale for Evaluation and Treatment Rehabilitation  SUBJECTIVE:   SUBJECTIVE STATEMENT:    Patient reports still not up to standing today - due to right hand from procedure last week  is still sore. States she goes to see ortho MD later today and will update PT if any concerns.     Pt accompanied by:  Paid caregiver-   PERTINENT HISTORY: Patient was previous patient (see previous episode for specifics) She experienced a fall at home on  08/15/2021 and was in [facility] from 08/15/2021- 08/20/2021.  Most recent history per Sherry [name] on 08/20/2021.  Sherry [name] is a 88 y.o. 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   Patient has history of quadriplegia, and has been doing PT/OT with improvement of function. Pt has some use of both arms and can feed herself. She can walk a little bit with assistance. Pt accidentally fell at about 930 this morning after taking shower.  She injured her hips. Hospital Course:    Closed left hip fracture [facility]) s/p mechanical fall Closed right hip fracture (?old) Acute blood loss anemia --pt is s/p  INTRAMEDULLARY (IM) NAIL FEMORAL-left on 7/16  PAIN:  Are you having pain? NO: NPRS scale: 0/10 Pain location: Right hip Pain description: Ache Aggravating factors: any active Movement of right side Relieving factors: Rest  PRECAUTIONS: Fall  WEIGHT BEARING RESTRICTIONS Yes Bilateral non-weight bearing B LE's  FALLS: Has patient fallen in last 6 months? Yes. Number of falls 1  LIVING ENVIRONMENT: Lives with: lives with their family and lives in a nursing home Lives in: Other Long term care at [place]: No Has following equipment at home: Wheelchair (power), Ramped entry, and hoyer lift  PLOF: Needs assistance with ADLs, Needs assistance with homemaking, Needs assistance with gait, Needs assistance with transfers, and Leisure: Work puzzles, read, Watch TV  PATIENT GOALS Patient goal is to walk; Be able to strengthen my arms for better use  OBJECTIVE:   DIAGNOSTIC FINDINGS: Overall cognitive statusEXAM: DG HIP (WITH OR WITHOUT PELVIS) 2-3V LEFT; DG HIP (WITH OR WITHOUT PELVIS) 2-3V RIGHT   COMPARISON:  CT of the abdomen and pelvis 01/21/2020    FINDINGS: Left proximal femur fracture noted just below the intertrochanteric region. Marked ventral angulation noted on the cross-table image. Slight varus angulation present   Comminuted intratrochanteric fracture present on the right also with marked ventral angulation.   IMPRESSION: 1. Bilateral hip fractures. 2. Comminuted intertrochanteric fracture of the right hip. 3. Angulated fracture of the proximal left femur just below the intertrochanteric region.     Electronically Signed   By: San Morelle M.D.   On: 08/15/2021 11:54:   COGNITION:  Within functional limits for tasks assessed   SENSATION: Light touch: Impaired   EDEMA:  None  Strength R/L 2-/2- Shoulder flexion (anterior deltoid/pec major/coracobrachialis, axillary n. (C5-6) and musculocutaneous n. (C5-7)) 2-/3+ (within available ROM around 130 on lef)Shoulder abduction (deltoid/supraspinatus, axillary/suprascapular n, C5) 3+/3+ Shoulder external rotation (infraspinatus/teres minor) 4/4 Shoulder internal rotation (subcapularis/lats/pec major) 3+/3+ Shoulder extension (posterior deltoid, lats, teres major, axillary/thoracodorsal n.) 3+/3+ Shoulder horizontal abduction 4/4 Elbow flexion (biceps brachii, brachialis, brachioradialis, musculoskeletal n, C5-6) 4/4 Elbow extension (triceps, radial n, C7) 2-/2- Wrist Extension 3+/3+ Wrist Flexion   LOWER EXTREMITY MMT:  MMT Right 01/11/22 Left 01/11/22  Hip flexion 2- 2-  Hip extension 2- 2-  Hip abduction 2- 2-  Hip adduction 2- 3  Hip internal rotation 2- 2-  Hip external rotation 2- 2-  Knee flexion 2- 2-  Knee extension 2+ 3+  Ankle dorsiflexion 2- NT - boot  Ankle plantarflexion 2- NT- boot  Ankle inversion 2- NT- boot  Ankle eversion 2- NT- boot  (Blank rows = not tested)  BED MOBILITY:  Unable to test due to NWB status of BLE- Patient currently dependent with all bed mobility using hoyer and caregiver  assist  TRANSFERS: Currently BLE NWB- Dependent on use of hoyer for all transfers  FUNCTIONAL TESTs:   PATIENT SURVEYS:  FOTO 12  TODAY'S TREATMENT:  THERE.EX:  Core- abd crunch/lumbar ext x 20+ reps Thoracic rotation 20 + reps Upper trunk side bending x 15-20 reps    AAROM- B hip flex 2 sets x 15 reps    AAROM- R LAQ - 2 sets of 15 reps    AROM- Left LAQ- 2 sets of 15 reps    Straight leg hip abd with PT holding heel for support -  manual resistance 2 sets of 10 reps each LE   Straight leg hip add with PT holding heel for support- maunal resistance - 2sets of 10 sec   Resistive B knee flexion with GTB 2sets x 15 reps each LE.    Manual resistive Leg press (seated)  2 sets x 15 reps each LE    Pt educated throughout session about proper posture and technique with exercises. Improved exercise technique, movement at target joints, use of target muscles after min to mod verbal, visual, tactile cues.   PATIENT EDUCATION: Education details: PT plan of care/use of resistive bands Person educated: Patient Education method: Explanation, Demonstration, Tactile cues, and Verbal cues Education comprehension: verbalized understanding, returned demonstration, verbal cues required, tactile cues required, and needs further education   HOME EXERCISE PROGRAM: No changes at this time  Access Code: [facility] URL: [email or web address] Date: 11/23/2021 Prepared by: Janna Arch Exercises - Seated Gluteal Sets - 2 x daily - 7 x weekly - 2 sets - 10 reps - 5 hold - Seated Quad Set - 2 x daily - 7 x weekly - 2 sets - 10 reps - 5 hold - Seated Long Arc Quad - 2 x daily - 7 x weekly - 2 sets - 10 reps - 5 hold - Seated March - 2 x daily - 7 x weekly - 2 sets - 10 reps - 5 hold - Seated Hip Abduction - 2 x daily - 7 x weekly - 2 sets - 10 reps - 5 hold - Seated Shoulder Shrugs - 2 x daily - 7 x weekly - 2 sets - 10 reps - 5 hold - Wheelchair Pressure Relief - 2 x daily - 7 x weekly - 2 sets  - 10 reps - 5 hold  GOALS: Goals reviewed with patient? Yes  SHORT TERM GOALS: Target date: 02/22/2022  Pt will be independent with initial UE strengthening HEP in order to improve strength and balance in order to decrease fall risk and improve function at home and work. Baseline: 10/19/2021- patient with no formal UE HEP; 12/02/2021= Patient verbalized knowledge of HEP including use of theraband for UE strengthening.  Goal status: GOAL MET  LONG TERM GOALS: Target date: 04/05/2022  Pt will be independent with final for UE/LE HEP in order to improve strength and balance in  order to decrease fall risk and improve function at home and work. Baseline: Patient is currently BLE NWB and unable to participate in HEP. Has order for UE strengthening. 01/11/2022- Patient now able to participate in LE strengthening although NWB still- good understanding for some basic exercises. Will keep goal active to incorporate progressive LE strengthening exercises. Goal status: Progressing  2.  Pt will improve FOTO to target score of 35  to display perceived improvements in ability to complete ADL's.  Baseline: 10/19/2021= 12; 12/02/2021= 17; 01/11/2022=17 Goal status: Ongoing  3.  Pt will increase strength of B UE  by at least 1/2 MMT grade in order to demonstrate improvement in strength and function  Baseline: patient has range of 2-/5 to 4/5 BUE Strength; 12/02/2021= 4/5 except with wrist ext. 01/11/2022= 4/5 B UE strength expept for wrist Goal status: Partially met  4. Pt. Will increase strength of RLE by at least 1/2 MMT grade in order to demonstrate improvement in Standing/transfers.  Baseline: 2-/5 Right hip flex/knee ext/flex  Goal status: Initial  5. Pt. Will demo ability to stand pivot transfer with max assist for improved functional mobility and less dependent need on mechanical device.   Baseline: Dependent on hoyer lift for all transfers  Goal status: Initial  6. Pt. Will demonstrate improved  functional LE strength as seen by ability to stand > 2 min for improved transfer ability and pregait abilities.   Baseline: not assessed today due to recent dx: UTI  Goal status: initial  ASSESSMENT:  CLINICAL IMPRESSION:  Patient due for recert but unable to try to stand today so most goals are mobility/standing/transfer goals so deferred recert until next visit. Treatment continued per plan of care focusing on strengthening to prepare for standing/transfers. Patient continues to present with good response to treatment- able to complete most reps at 15 for 2 sets and able to participate in some manual resistance with each LE well today.  Pt will continue to benefit from skilled PT services to improve strength and functional mobility to maximize functional independence with transfers and improved quality of life with less dependence on mechanical equipment.    OBJECTIVE IMPAIRMENTS Abnormal gait, decreased activity tolerance, decreased balance, decreased coordination, decreased endurance, decreased mobility, difficulty walking, decreased ROM, decreased strength, hypomobility, impaired flexibility, impaired UE functional use, postural dysfunction, and pain.   ACTIVITY LIMITATIONS carrying, lifting, bending, sitting, standing, squatting, sleeping, stairs, transfers, bed mobility, continence, bathing, toileting, dressing, self feeding, reach over head, hygiene/grooming, and caring for others  PARTICIPATION LIMITATIONS: meal prep, cleaning, laundry, medication management, personal finances, interpersonal relationship, driving, shopping, community activity, and yard work  PERSONAL FACTORS Age, Time since onset of injury/illness/exacerbation, and 1-2 comorbidities: HTN, cervical Sx  are also affecting patient's functional outcome.   REHAB POTENTIAL: Good  CLINICAL DECISION MAKING: Evolving/moderate complexity  EVALUATION COMPLEXITY: Moderate  PLAN: PT FREQUENCY: 1-2x/week  PT DURATION: 12  weeks  PLANNED INTERVENTIONS: Therapeutic exercises, Therapeutic activity, Neuromuscular re-education, Balance training, Gait training, Patient/Family education, Self Care, Joint mobilization, DME instructions, Dry Needling, Electrical stimulation, Wheelchair mobility training, Spinal mobilization, Cryotherapy, Moist heat, and Manual therapy  PLAN FOR NEXT SESSION: Continue with weight bearing activities and LE strengthening as appropriate. RECERT Next visit.  Ollen Bowl, PT Physical Therapist- [facility]  04/05/2022, 2:20 PM

## 2022-04-07 ENCOUNTER — Ambulatory Visit: Payer: Medicare HMO

## 2022-04-07 ENCOUNTER — Ambulatory Visit: Payer: Medicare HMO | Admitting: Occupational Therapy

## 2022-04-07 DIAGNOSIS — R2689 Other abnormalities of gait and mobility: Secondary | ICD-10-CM

## 2022-04-07 DIAGNOSIS — M6281 Muscle weakness (generalized): Secondary | ICD-10-CM

## 2022-04-07 DIAGNOSIS — R2681 Unsteadiness on feet: Secondary | ICD-10-CM

## 2022-04-07 DIAGNOSIS — R262 Difficulty in walking, not elsewhere classified: Secondary | ICD-10-CM

## 2022-04-07 DIAGNOSIS — S72001A Fracture of unspecified part of neck of right femur, initial encounter for closed fracture: Secondary | ICD-10-CM

## 2022-04-07 DIAGNOSIS — R269 Unspecified abnormalities of gait and mobility: Secondary | ICD-10-CM

## 2022-04-07 DIAGNOSIS — S14129S Central cord syndrome at unspecified level of cervical spinal cord, sequela: Secondary | ICD-10-CM

## 2022-04-07 DIAGNOSIS — R278 Other lack of coordination: Secondary | ICD-10-CM

## 2022-04-07 NOTE — Therapy (Signed)
OUTPATIENT PHYSICAL THERAPY NEURO TREATMENT/RECERTIFICATION  Patient Name: Sherry [name] MRN: [id number] DOB:1935-01-01, 88 y.o., female Today's Date: 04/11/2022   PCP: Dewayne Shorter, MD REFERRING PROVIDER: Earnestine Leys   PT End of Session - 04/11/22 2326     Visit Number 34    Number of Visits 58    Date for PT Re-Evaluation 06/30/22    Authorization Time Period Initial cert [id number]; Recert [id number]; Recert [id number]    Progress Note Due on Visit 63    PT Start Time 1145    PT Stop Time 1226    PT Time Calculation (min) 41 min    Equipment Utilized During Treatment Gait belt    Activity Tolerance Patient tolerated treatment well    Behavior During Therapy WFL for tasks assessed/performed                          Past Medical History:  Diagnosis Date   Acute blood loss anemia    Arthritis    Cancer ([place])    skin   Central cord syndrome at C6 level of cervical spinal cord ([place]) 11/29/2017   Hypertension    Protein-calorie malnutrition, severe ([place]) 01/24/2018   S/P insertion of IVC (inferior vena caval) filter 01/24/2018   Tetraparesis ([place])    Past Surgical History:  Procedure Laterality Date   ANTERIOR CERVICAL DECOMP/DISCECTOMY FUSION N/A 11/29/2017   Procedure: Cervical five-six, six-seven Anterior Cervical Decompression Fusion;  Surgeon: Judith Part, MD;  Location: [place];  Service: Neurosurgery;  Laterality: N/A;  Cervical five-six, six-seven Anterior Cervical Decompression Fusion   CATARACT EXTRACTION     FEMUR IM NAIL Left 08/16/2021   Procedure: INTRAMEDULLARY (IM) NAIL FEMORAL;  Surgeon: Earnestine Leys, MD;  Location: [facility];  Service: Orthopedics;  Laterality: Left;   IR IVC FILTER PLMT / S&I /IMG GUID/MOD SED  12/08/2017   Patient Active Problem List   Diagnosis Date Noted   Hypertensive heart disease with other congestive heart failure ([place]) 02/14/2022   Chronic indwelling Foley catheter  02/14/2022   Closed hip fracture ([place]) 08/15/2021   DVT (deep venous thrombosis) ([place]) 08/15/2021   Quadriplegia ([place]) 08/15/2021   Fall 08/15/2021   Constipation due to slow transit 08/31/2018   Trauma 06/05/2018   Neuropathic pain 06/05/2018   Neurogenic bowel 06/05/2018   Vaginal yeast infection 01/30/2018   Healthcare-associated pneumonia 01/25/2018   Chronic allergic rhinitis 01/24/2018   Depression with anxiety 01/24/2018   UTI due to Klebsiella species 01/24/2018   Protein-calorie malnutrition, severe ([place]) 01/24/2018   S/P insertion of IVC (inferior vena caval) filter 01/24/2018   Tetraparesis ([place]) 01/20/2018   Neurogenic bladder 01/20/2018   Reactive depression    Benign essential HTN    Acute postoperative anemia due to expected blood loss    Central cord syndrome at C6 level of cervical spinal cord ([place]) 11/29/2017   Allergy to alpha-gal 11/25/2016   SCC (squamous cell carcinoma) 04/08/2014    ONSET DATE: 08/15/2021 (fall with B hip fx); Initial injury was in 2019- quadraplegia due to central cord syndrome of C6 secondary to neck fracture.   REFERRING DIAG: Bilateral Hip fx; Left Open- s/p Left IM nail femoral on 08/16/2021; Right closed  THERAPY DIAG:  Muscle weakness (generalized)  Other lack of coordination  Abnormality of gait and mobility  Difficulty in walking, not elsewhere classified  Other abnormalities of gait and mobility  Closed fracture of both hips, initial encounter (HCC)  Unsteadiness on feet  Central cord syndrome, sequela ([place])  Rationale for Evaluation and Treatment Rehabilitation  SUBJECTIVE:   SUBJECTIVE STATEMENT:    Patient reports ortho MD has cleared her but states her hand is still bothering her so she declined to try to stand today. States only pain with pressure and requested to wait until next week to test her standing.    Pt accompanied by:  Paid caregiver-   PERTINENT HISTORY: Patient was previous patient (see  previous episode for specifics) She experienced a fall at home on 08/15/2021 and was in [facility] from 08/15/2021- 08/20/2021.  Most recent history per Dr. Laurey Arrow on 08/20/2021.  Sherry [name] is a 88 y.o. 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   Patient has history of quadriplegia, and has been doing PT/OT with improvement of function. Pt has some use of both arms and can feed herself. She can walk a little bit with assistance. Pt accidentally fell at about 930 this morning after taking shower.  She injured her hips. Hospital Course:    Closed left hip fracture [facility]) s/p mechanical fall Closed right hip fracture (?old) Acute blood loss anemia --pt is s/p  INTRAMEDULLARY (IM) NAIL FEMORAL-left on 7/16  PAIN:  Are you having pain? NO: NPRS scale: 0/10 Pain location: Right hip Pain description: Ache Aggravating factors: any active Movement of right side Relieving factors: Rest  PRECAUTIONS: Fall  WEIGHT BEARING RESTRICTIONS Yes Bilateral non-weight bearing B LE's  FALLS: Has patient fallen in last 6 months? Yes. Number of falls 1  LIVING ENVIRONMENT: Lives with: lives with their family and lives in a nursing home Lives in: Other Long term care at [place]: No Has following equipment at home: Wheelchair (power), Ramped entry, and hoyer lift  PLOF: Needs assistance with ADLs, Needs assistance with homemaking, Needs assistance with gait, Needs assistance with transfers, and Leisure: Work puzzles, read, Watch TV  PATIENT GOALS Patient goal is to walk; Be able to strengthen my arms for better use  OBJECTIVE:   DIAGNOSTIC FINDINGS: Overall cognitive statusEXAM: DG HIP (WITH OR WITHOUT PELVIS) 2-3V LEFT; DG HIP (WITH OR WITHOUT PELVIS) 2-3V  RIGHT   COMPARISON:  CT of the abdomen and pelvis 01/21/2020   FINDINGS: Left proximal femur fracture noted just below the intertrochanteric region. Marked ventral angulation noted on the cross-table image. Slight varus angulation present   Comminuted intratrochanteric fracture present on the right also with marked ventral angulation.   IMPRESSION: 1. Bilateral hip fractures. 2. Comminuted intertrochanteric fracture of the right hip. 3. Angulated fracture of the proximal left femur just below the intertrochanteric region.     Electronically Signed   By: San Morelle M.D.   On: 08/15/2021 11:54:   COGNITION:  Within functional limits for tasks assessed   SENSATION: Light touch: Impaired   EDEMA:  None  Strength R/L 2-/2- Shoulder flexion (anterior deltoid/pec major/coracobrachialis, axillary n. (C5-6) and musculocutaneous n. (C5-7)) 2-/3+ (within available ROM around 130 on lef)Shoulder abduction (deltoid/supraspinatus, axillary/suprascapular n, C5) 3+/3+ Shoulder external rotation (infraspinatus/teres minor) 4/4 Shoulder internal rotation (subcapularis/lats/pec major) 3+/3+ Shoulder extension (posterior deltoid, lats, teres major, axillary/thoracodorsal n.) 3+/3+ Shoulder horizontal abduction 4/4 Elbow flexion (biceps brachii, brachialis, brachioradialis, musculoskeletal n, C5-6) 4/4 Elbow extension (triceps, radial n, C7) 2-/2- Wrist Extension 3+/3+ Wrist Flexion   LOWER EXTREMITY  MMT:    MMT Right 01/11/22 Left 01/11/22  Hip flexion 2- 2-  Hip extension 2- 2-  Hip abduction 2- 2-  Hip adduction 2- 3  Hip internal rotation 2- 2-  Hip external rotation 2- 2-  Knee flexion 2- 2-  Knee extension 2+ 3+  Ankle dorsiflexion 2- NT - boot  Ankle plantarflexion 2- NT- boot  Ankle inversion 2- NT- boot  Ankle eversion 2- NT- boot  (Blank rows = not tested)  BED MOBILITY:  Unable to test due to NWB status of BLE- Patient currently dependent with all  bed mobility using hoyer and caregiver assist  TRANSFERS: Currently BLE NWB- Dependent on use of hoyer for all transfers  FUNCTIONAL TESTs:   PATIENT SURVEYS:  FOTO 12  TODAY'S TREATMENT:  THERE.EX:  Core- abd crunch/lumbar ext x 20+ reps Thoracic rotation 20 + reps Upper trunk side bending x 15-20 reps    AAROM- B hip flex 2 sets x 15 reps    AAROM- R LAQ - 2 sets of 15 reps    AROM- Left LAQ- 2 sets of 15 reps    Straight leg hip abd with PT holding heel for support -  manual resistance 2 sets of 10 reps each LE   Straight leg hip add with PT holding heel for support- maunal resistance - 2sets of 10 sec   Resistive B knee flexion with GTB 2sets x 15 reps each LE.    Manual resistive Leg press (seated)  2 sets x 15 reps each LE    Pt educated throughout session about proper posture and technique with exercises. Improved exercise technique, movement at target joints, use of target muscles after min to mod verbal, visual, tactile cues.   PATIENT EDUCATION: Education details: PT plan of care/use of resistive bands Person educated: Patient Education method: Explanation, Demonstration, Tactile cues, and Verbal cues Education comprehension: verbalized understanding, returned demonstration, verbal cues required, tactile cues required, and needs further education   HOME EXERCISE PROGRAM: No changes at this time  Access Code: [facility] URL: [email or web address] Date: 11/23/2021 Prepared by: Janna Arch Exercises - Seated Gluteal Sets - 2 x daily - 7 x weekly - 2 sets - 10 reps - 5 hold - Seated Quad Set - 2 x daily - 7 x weekly - 2 sets - 10 reps - 5 hold - Seated Long Arc Quad - 2 x daily - 7 x weekly - 2 sets - 10 reps - 5 hold - Seated March - 2 x daily - 7 x weekly - 2 sets - 10 reps - 5 hold - Seated Hip Abduction - 2 x daily - 7 x weekly - 2 sets - 10 reps - 5 hold - Seated Shoulder Shrugs - 2 x daily - 7 x weekly - 2 sets - 10 reps - 5 hold - Wheelchair Pressure  Relief - 2 x daily - 7 x weekly - 2 sets - 10 reps - 5 hold  GOALS: Goals reviewed with patient? Yes  SHORT TERM GOALS: Target date: 02/22/2022  Pt will be independent with initial UE strengthening HEP in order to improve strength and balance in order to decrease fall risk and improve function at home and work. Baseline: 10/19/2021- patient with no formal UE HEP; 12/02/2021= Patient verbalized knowledge of HEP including use of theraband for UE strengthening.  Goal status: GOAL MET  LONG TERM GOALS: Target date: 06/30/2022  Pt will be independent with final for UE/LE HEP in order to improve  strength and balance in order to decrease fall risk and improve function at home and work. Baseline: Patient is currently BLE NWB and unable to participate in HEP. Has order for UE strengthening. 01/11/2022- Patient now able to participate in LE strengthening although NWB still- good understanding for some basic exercises. Will keep goal active to incorporate progressive LE strengthening exercises. 04/07/2022= Patient still participating in progressive LE seated HEP and has no questions at this time.  Goal status: MET  2.  Pt will improve FOTO to target score of 35  to display perceived improvements in ability to complete ADL's.  Baseline: 10/19/2021= 12; 12/02/2021= 17; 01/11/2022=17; 04/07/2022= 17 Goal status: Ongoing  3.  Pt will increase strength of B UE  by at least 1/2 MMT grade in order to demonstrate improvement in strength and function  Baseline: patient has range of 2-/5 to 4/5 BUE Strength; 12/02/2021= 4/5 except with wrist ext. 01/11/2022= 4/5 B UE strength expept for wrist Goal status: Partially met  4. Pt. Will increase strength of RLE by at least 1/2 MMT grade in order to demonstrate improvement in Standing/transfers.  Baseline: 2-/5 Right hip flex/knee ext/flex; 04/07/2022= 2- with right hip flex/knee ext (lacking 28 deg from zero)   Goal status: ONGOING- able to extend right knee more-closer to full  ROM  5. Pt. Will demo ability to stand pivot transfer with max assist for improved functional mobility and less dependent need on mechanical device.   Baseline: Dependent on hoyer lift for all transfers; 04/07/2022- Unable to test secondary to patient with recent UTI and right hand procedure - will attempt to reassess next visit.   Goal status: ONGOING  6. Pt. Will demonstrate improved functional LE strength as seen by ability to stand > 2 min for improved transfer ability and pregait abilities.   Baseline: not assessed today due to recent dx: UTI; 04/07/2022- Unable to test secondary to patient with recent UTI and right hand procedure - will attempt to reassess next visit.   Goal status: ONGOING  ASSESSMENT:  CLINICAL IMPRESSION:  Patient due for recert but  still unable to try to stand today so newer mobility goals are deferred until next visit. All remaining goals are still appropriate- Treatment continued per plan of care focusing on strengthening to prepare for standing/transfers. Patient's condition has the potential to improve in response to therapy. Maximum improvement is yet to be obtained. The anticipated improvement is attainable and reasonable in a generally predictable time.  Pt will continue to benefit from skilled PT services to improve strength and functional mobility to maximize functional independence with transfers and improved quality of life with less dependence on mechanical equipment.    OBJECTIVE IMPAIRMENTS Abnormal gait, decreased activity tolerance, decreased balance, decreased coordination, decreased endurance, decreased mobility, difficulty walking, decreased ROM, decreased strength, hypomobility, impaired flexibility, impaired UE functional use, postural dysfunction, and pain.   ACTIVITY LIMITATIONS carrying, lifting, bending, sitting, standing, squatting, sleeping, stairs, transfers, bed mobility, continence, bathing, toileting, dressing, self feeding, reach over head,  hygiene/grooming, and caring for others  PARTICIPATION LIMITATIONS: meal prep, cleaning, laundry, medication management, personal finances, interpersonal relationship, driving, shopping, community activity, and yard work  PERSONAL FACTORS Age, Time since onset of injury/illness/exacerbation, and 1-2 comorbidities: HTN, cervical Sx  are also affecting patient's functional outcome.   REHAB POTENTIAL: Good  CLINICAL DECISION MAKING: Evolving/moderate complexity  EVALUATION COMPLEXITY: Moderate  PLAN: PT FREQUENCY: 1-2x/week  PT DURATION: 12 weeks  PLANNED INTERVENTIONS: Therapeutic exercises, Therapeutic activity, Neuromuscular re-education, Balance training,  Gait training, Patient/Family education, Self Care, Joint mobilization, DME instructions, Dry Needling, Electrical stimulation, Wheelchair mobility training, Spinal mobilization, Cryotherapy, Moist heat, and Manual therapy  PLAN FOR NEXT SESSION: Continue with weight bearing activities and LE strengthening as appropriate.   Ollen Bowl, PT Physical Therapist- [facility]  04/11/2022, 11:37 PM

## 2022-04-07 NOTE — Therapy (Signed)
Occupational Therapy Progress/Recertification Note  Dates of reporting period  02/03/2022   to   04/07/2022     Patient Name: Sherry [name] MRN: [id number] DOB:Dec 17, 1934, 88 y.o., female Today's Date: 04/07/2022                                         PCP: Dr. Viviana Simpler REFERRING PROVIDER: Dr. Viviana Simpler   OT End of Session - 04/07/22 1310     Visit Number [id number]    Number of Visits 69    Date for OT Re-Evaluation 06/30/22    Authorization Time Period Progress report period starting 12/14/2021    OT Start Time 1102    OT Stop Time 1145    OT Time Calculation (min) 43 min    Equipment Utilized During Treatment moist heat    Activity Tolerance Patient tolerated treatment well    Behavior During Therapy WFL for tasks assessed/performed                  Past Medical History:  Diagnosis Date   Acute blood loss anemia    Arthritis    Cancer ([place])    skin   Central cord syndrome at C6 level of cervical spinal cord ([place]) 11/29/2017   Hypertension    Protein-calorie malnutrition, severe ([place]) 01/24/2018   S/P insertion of IVC (inferior vena caval) filter 01/24/2018   Tetraparesis ([place])    Past Surgical History:  Procedure Laterality Date   ANTERIOR CERVICAL DECOMP/DISCECTOMY FUSION N/A 11/29/2017   Procedure: Cervical five-six, six-seven Anterior Cervical Decompression Fusion;  Surgeon: Judith Part, MD;  Location: [place];  Service: Neurosurgery;  Laterality: N/A;  Cervical five-six, six-seven Anterior Cervical Decompression Fusion   CATARACT EXTRACTION     FEMUR IM NAIL Left 08/16/2021   Procedure: INTRAMEDULLARY (IM) NAIL FEMORAL;  Surgeon: Earnestine Leys, MD;  Location: [facility];  Service: Orthopedics;  Laterality: Left;   IR IVC FILTER PLMT / S&I /IMG GUID/MOD SED  12/08/2017   Patient Active Problem List   Diagnosis Date Noted   Hypertensive heart disease with other congestive heart failure ([place]) 02/14/2022   Chronic indwelling Foley catheter 02/14/2022    Closed hip fracture ([place]) 08/15/2021   DVT (deep venous thrombosis) ([place]) 08/15/2021   Quadriplegia ([place]) 08/15/2021   Fall 08/15/2021   Constipation due to slow transit 08/31/2018   Trauma 06/05/2018   Neuropathic pain 06/05/2018   Neurogenic bowel 06/05/2018   Vaginal yeast infection 01/30/2018   Healthcare-associated pneumonia 01/25/2018   Chronic allergic rhinitis 01/24/2018   Depression with anxiety 01/24/2018   UTI due to Klebsiella species 01/24/2018   Protein-calorie malnutrition, severe ([place]) 01/24/2018   S/P insertion of IVC (inferior vena caval) filter 01/24/2018   Tetraparesis ([place]) 01/20/2018   Neurogenic bladder 01/20/2018   Reactive depression    Benign essential HTN    Acute postoperative anemia due to expected blood loss    Central cord syndrome at C6 level of cervical spinal cord ([place]) 11/29/2017   Allergy to alpha-gal 11/25/2016   SCC (squamous cell carcinoma) 04/08/2014    REFERRING DIAG: Central Cord Syndrome at C6 of the Cervical Spinal Cord, Fall with Bilateral Closed Hip Fractures, with ORIF repair with Intramedullary Nailing of the Left Hip Fracture.  THERAPY DIAG:  Muscle weakness (generalized)  Rationale for Evaluation and Treatment Rehabilitation  PERTINENT HISTORY: Patient s/p fall November 29, 2017 resulting  in diagnosis of central cord syndrome at C 6 level.  She has had therapy in multiple venues but with recent move to [facility], therapy staff recommended she seek outpatient therapy for her needs. Pt. Had a recent fall with Bilateral Closed Hip Fractures, with ORIF repair with Intramedullary Nailing of the Left Hip Fracture.   PRECAUTIONS: None  SUBJECTIVE:   Pt. reports doing well today.  PAIN:  Are you having pain? No     OBJECTIVE:   Treatment:   Measurements were obtained, and goals reviewed with the Pt.     PATIENT EDUCATION:  Education details: BUE ROM, hand function, UE functioning Person educated: Patient Education  method: Explanation and Demonstration Education comprehension: verbalized understanding, returned demonstration, and needs further education   HOME EXERCISE PROGRAM Continue with ongoing HEPs  Measurements:  12/09/2021:  Shoulder flexion: R: 120(136), L: 138(150) Shoulder abduction: R: 108(120), L: 110(120) Elbow: R: 0-148, L: 0-146 Wrist flexion: R: 55(62), L: 78 Writ extension: R: 34(55), L: 3(4) RD: R: 12(20), L: 16(26) UD: R: 8(24), L: 6(18) Thumb radial abduction: R: 11(20), L: 8(20) Digit flexion to the [facility]: R:  2nd: 5cm(3.5cm), 3rd: 7.5 cm(5cm), 4th: 7cm(3cm), 5th: 6cm(5.5cm) L: 2nd: 8cm(8cm), 3rd: 8cm(8cm), 4th: 7.5cm(7cm), 5th: 7cm(7cm)  02/03/2022:   Shoulder flexion: R: 122(138), L: 148(154) Shoulder abduction: R: 109(125), L: 125(125 Elbow: R: 0-148, L: 0-146 Wrist flexion: R: 60(68), L: 79 Writ extension: R: 40(55), L: 3(10) RD: R: 14(24), L: 20(28) UD: R: 8(24), L: 6(18) Thumb radial abduction: R: 20(25), L: 8(20) Digit flexion to the [facility]: R:  2nd: 4.5cm(3cm), 3rd: 6.5cm(4.5cm), 4th: 6.5 cm(3cm), 5th: 4.5cm(5cm) L: 2nd: 8cm(8cm), 3rd: 8cm(7cm), 4th: 7.5cm(7cm), 5th: 6.5cm(6.5cm)   04/07/2022:   Shoulder flexion: R: 125(150), L: 160(160) Shoulder abduction: R: 109(125), L: 125(125 Elbow: R: 0-148, L: 0-146 Wrist flexion: R: 60(68), L: 79 Writ extension: R: 40(55), L: 5(18) RD: R: 14(24), L: 20(28) UD: R: 18(24), L: 8(18) Thumb radial abduction: R: 20(25), L: 8(20) Digit flexion to the [facility]: R:  2nd: 4 cm(4cm), 3rd: 6.5cm(4cm), 4th: 7 cm (5cm), 5th: 6cm(5cm) L: 2nd: 8cm(8cm), 3rd: 8cm(7cm), 4th: 7.5cm(7cm), 5th: 7cm(7cm)           OT Long Term Goals - 07/06/21 1106                                                                                          Target Date: 06/30/2022    OT LONG TERM GOAL #1   Title Patient and caregiver will demonstrate understanding of home exercise program for ROM.    Baseline 12/09/2021: Pt. Reports the restorative  ROM program has not resumed at the facility. 10/28/2021: Pt. Is attempting exercises/stretches at home. The facility restorative aides have not resumed exercises. 09/14/2021:  Pt. Reports inconsistencies with restorative ROM at  her care home. Pt/caregiver education to be provided about ongoing ROM needs.    Time 12    Period Weeks    Status Deferred     OT LONG TERM GOAL #2   Title Pt.  will improve BUE strength to be able to sustain her UE's in elevation to efficiently, and  effectively perform hair care, and self-grooming tasks.   Baseline 02/03/2022: Pt. has improved, and is now able to sustain her Bilateral UEs in elevation long enough to efficiently, and effectively perform haircare, and self-grooming tasks. 12/09/2021: right shoulder 4-/5, left shoulder 4/5. Pt. has improved to be able to reach up for hair care, and self-grooming tasks. 10/28/2021: Right shoulder 3+/5, Left shoulder 4-/5 09/14/2021:  BUE shoulder strength: 3+/5 for shoulder flexion, and abduction.   Time 12    Period Weeks    Status Achieved               OT LONG TERM GOAL #3   Title Patient will demonstrate improved composite finger flexion to hold deodorant to apply to underarms.    Baseline 04/07/2022: Pt. Has improved with right 2nd, and 3rd digit flexion towards the [facility]. Pt. Continues to have difficulty securely holding and applying deodorant.02/03/2022: Pt. Has improved with digit flexion, however continues to have difficulty securely holding and using deoderant. 12/09/2021: Bilateral hand/digit MP, PIP, and DIP extension tightness limits her ability achieve digit flexion to hold, and apply deodorant. 10/28/2021:  Pt. Continues to have difficulty holding the deodorant. 01/12/2021: Pt. Presents with limited digit extension. Pt. Is able to initiate holding deodorant, however is unable to hold it while using it.   Time 12    Period Weeks    Status Ongoing   Target Date                  OT LONG TERM GOAL #4   Title Pt. Will  improve bilateral wrist extension in preparation for anticipating, and initiating reaching for objects at the table.    Baseline 04/07/2022: Rght: 40(55) Left: 5(18) 02/03/2022: Right: 34(55) Left: 3(4) 12/09/2021: Right  34(55)  Left  3(4) 10/28/2021:  Right 22(38), Left 0(15) 09/14/2021:  Right: 17(35), left 2(15)   Time 12   Period weeks   Status Ongoing   Target Date      OT LONG TERM GOAL #5   Title Pt. will write, and sign her name with 100% legibility, and modified independence.    Baseline 04/07/2022: Pt. Reports that she continues to present with shaky appearing writing. Pt.reports that she is writing more when filling out her menu, and complete puzzles 02/03/2022: Pt. Continues to report legibility appears "shaky" when attempting signing her name. Pt.reports that she is writing more when filling out her menu, and complete puzzles.12/09/2021: Pt. Reports legibility appears "shaky" when attempting signing her name. Pt.reports that she is writing more when filling out her menu. 10/28/2021: pt. is able to sign her name, however legibility continues to be inconsistent. Pt. Reports "shaky appearance". 09/14/2021: Pt. continues to fill out her daily menu and complete puzzles. Pt. Continues to present with limited writing legibility.   Time 12    Period Weeks    Status  Ongoing         OT LONG TERM GOAL  #6   TITLE Pt. will turn pages in a book with modified independence.    Baseline 02/03/2022: Pt. Is able to effectively turn pages of a book. 10/28/2021: Pt. Is able to consistently turn book pages. 09/14/2021: Pt. has difficulty turning pages.    Time 12    Period Weeks    Status Achieved       OT LONG TERM GOAL  #7  TITLE Pt. Will increase  bilateral lateral pinch strength by 2 lbs to be able to securely grasp items during ADLs, and IADL  tasks.   Baseline 04/07/2022: NT-Pinch meter out for calibration. 02/03/2022: Right: 6#, Left: 4# 12/09/2021:  Right: 6#, Left: 4#   Time 12  Period Weeks  Status  Ongoing           OT LONG TERM GOAL  #8  TITLE Pt. Will button a sweater with large 1" buttons, and zip her jacket with modified independence.   Baseline 04/07/2022: Pt. Has difficulty manipulating buttons, however was introduced to a buttonhook. Pt. Was able to hold, and use a buttonhook for buttons on a shirt. 02/03/2022: Pt. is unable to manipulate buttons on her sweater, or zippers on her jacket.  Time 12  Period Weeks  Status Ongoing   OT LONG TERM GOAL  #9  TITLE Pt. will complete plant care with modified independence.  Baseline 04/07/2022: Pt. is now able to hold a cup and water her plants.02/03/2022: Pt. has difficulty caring for her plants.   Time 12  Period Weeks  Status Ongoing   OT LONG TERM GOAL  #10  TITLE Pt. will demonstrate adaptive techniques to assist with the efficiency of self-dressing, or morning care tasks.  Baseline 04/07/2022: Pt. Continues to require assist with the efficiency of self-dressing,a nd morning care tasks.02/03/2022: Pt. Requires assist from caregivers 2/2 time limitations during morning care.   Time 12  Period Weeks  Status Ongoing     Plan - 07/22/21 1737     Clinical Impression Statement Measurements were obtained, and goals were reviewed with the Pt. Pt. continues to make progress with bilateral UE strength, and continues to try to engage her bilateral hands more during daily ADL, and IADL tasks. Pt. Has improved with bilateral UE ROM. Pt. is now able to assist more with using her bilateral hands to care for her plants, continues to engage in writing for filling out her menus, and puzzles. Pt. was able to demonstrate use of a buttonhook for buttoning a shirt. Pt. continues to present with tightness at the bilateral thumb webspace, and bilateral wrist flexor tightness, as well as digit MP, PIP, and DIP extensor tightness which continues to limit her ability to complete ADL tasks efficiently. Pt. continues to benefit from working on impoving ROM in  order to work towards increasing bilateral hand grasp on objects, and increase engagement of her bilateral hands during ADLs, and IADL tasks.                     OT Occupational Profile and History Detailed Assessment- Review of Records and additional review of physical, cognitive, psychosocial history related to current functional performance    Occupational performance deficits (Please refer to evaluation for details): ADL's;IADL's;Leisure;Social Participation    Body Structure / Function / Physical Skills ADL;Continence;Dexterity;Flexibility;Strength;ROM;Balance;Coordination;FMC;IADL;Endurance;UE functional use;Decreased knowledge of use of DME;GMC    Psychosocial Skills Coping Strategies    Rehab Potential Fair    Clinical Decision Making Several treatment options, min-mod task modification necessary    Comorbidities Affecting Occupational Performance: Presence of comorbidities impacting occupational performance    Comorbidities impacting occupational performance description: contractures of bilateral hands, dependent transfers,    Modification or Assistance to Complete Evaluation  No modification of tasks or assist necessary to complete eval    OT Frequency 2x / week    OT Duration 12 weeks    OT Treatment/Interventions Self-care/ADL training;Cryotherapy;Therapeutic exercise;DME and/or AE instruction;Balance training;Neuromuscular education;Manual Therapy;Splinting;Moist Heat;Passive range of motion;Therapeutic activities;Patient/family education    Plan continue to progress ROM of digits, wrists and shoulders as it  pertains to completion of ADL tasks that pt values.    Consulted and Agree with Plan of Care Patient            Harrel Carina, MS, OTR/L  Harrel Carina, OT 04/07/2022, 1:14 PM      `

## 2022-04-08 ENCOUNTER — Ambulatory Visit: Payer: Medicare HMO | Admitting: Occupational Therapy

## 2022-04-12 ENCOUNTER — Ambulatory Visit: Payer: Medicare HMO

## 2022-04-12 ENCOUNTER — Ambulatory Visit: Payer: Medicare HMO | Admitting: Occupational Therapy

## 2022-04-12 DIAGNOSIS — M6281 Muscle weakness (generalized): Secondary | ICD-10-CM

## 2022-04-12 DIAGNOSIS — R262 Difficulty in walking, not elsewhere classified: Secondary | ICD-10-CM

## 2022-04-12 DIAGNOSIS — R278 Other lack of coordination: Secondary | ICD-10-CM

## 2022-04-12 DIAGNOSIS — R269 Unspecified abnormalities of gait and mobility: Secondary | ICD-10-CM

## 2022-04-12 NOTE — Therapy (Signed)
OUTPATIENT PHYSICAL THERAPY NEURO TREATMENT  Patient Name: Sherry [name] MRN: [id number] DOB:17-Apr-1934, 88 y.o., female Today's Date: 04/12/2022   PCP: Dewayne Shorter, MD REFERRING PROVIDER: Earnestine Leys   PT End of Session - 04/12/22 1149     Visit Number 35    Number of Visits 58    Date for PT Re-Evaluation 06/30/22    Authorization Time Period Initial cert [id number]; Recert [id number]; Recert [id number]    Progress Note Due on Visit 74    PT Start Time 1147    PT Stop Time 1225    PT Time Calculation (min) 38 min    Equipment Utilized During Treatment Gait belt    Activity Tolerance Patient tolerated treatment well    Behavior During Therapy WFL for tasks assessed/performed                          Past Medical History:  Diagnosis Date   Acute blood loss anemia    Arthritis    Cancer ([place])    skin   Central cord syndrome at C6 level of cervical spinal cord ([place]) 11/29/2017   Hypertension    Protein-calorie malnutrition, severe ([place]) 01/24/2018   S/P insertion of IVC (inferior vena caval) filter 01/24/2018   Tetraparesis ([place])    Past Surgical History:  Procedure Laterality Date   ANTERIOR CERVICAL DECOMP/DISCECTOMY FUSION N/A 11/29/2017   Procedure: Cervical five-six, six-seven Anterior Cervical Decompression Fusion;  Surgeon: Judith Part, MD;  Location: [facility];  Service: Neurosurgery;  Laterality: N/A;  Cervical five-six, six-seven Anterior Cervical Decompression Fusion   CATARACT EXTRACTION     FEMUR IM NAIL Left 08/16/2021   Procedure: INTRAMEDULLARY (IM) NAIL FEMORAL;  Surgeon: Earnestine Leys, MD;  Location: [facility];  Service: Orthopedics;  Laterality: Left;   IR IVC FILTER PLMT / S&I /IMG GUID/MOD SED  12/08/2017   Patient Active Problem List   Diagnosis Date Noted   Hypertensive heart disease with other congestive heart failure ([place]) 02/14/2022   Chronic indwelling Foley catheter 02/14/2022    Closed hip fracture ([place]) 08/15/2021   DVT (deep venous thrombosis) ([place]) 08/15/2021   Quadriplegia ([place]) 08/15/2021   Fall 08/15/2021   Constipation due to slow transit 08/31/2018   Trauma 06/05/2018   Neuropathic pain 06/05/2018   Neurogenic bowel 06/05/2018   Vaginal yeast infection 01/30/2018   Healthcare-associated pneumonia 01/25/2018   Chronic allergic rhinitis 01/24/2018   Depression with anxiety 01/24/2018   UTI due to Klebsiella species 01/24/2018   Protein-calorie malnutrition, severe ([place]) 01/24/2018   S/P insertion of IVC (inferior vena caval) filter 01/24/2018   Tetraparesis ([place]) 01/20/2018   Neurogenic bladder 01/20/2018   Reactive depression    Benign essential HTN    Acute postoperative anemia due to expected blood loss    Central cord syndrome at C6 level of cervical spinal cord ([place]) 11/29/2017   Allergy to alpha-gal 11/25/2016   SCC (squamous cell carcinoma) 04/08/2014    ONSET DATE: 08/15/2021 (fall with B hip fx); Initial injury was in 2019- quadraplegia due to central cord syndrome of C6 secondary to neck fracture.   REFERRING DIAG: Bilateral Hip fx; Left Open- s/p Left IM nail femoral on 08/16/2021; Right closed  THERAPY DIAG:  Muscle weakness (generalized)  Other lack of coordination  Abnormality of gait and mobility  Difficulty in walking, not elsewhere classified  Rationale for Evaluation and Treatment Rehabilitation  SUBJECTIVE:   SUBJECTIVE STATEMENT:    Patient reports she is  up to assessing the remainder of her goals as her R hand is feeling better. No pain today.  Pt accompanied by:  Paid caregiver-   PERTINENT HISTORY: Patient was previous patient (see previous episode for specifics) She experienced a fall at home on 08/15/2021 and was in [facility] from 08/15/2021- 08/20/2021.  Most recent history per Dr. Laurey Arrow on 08/20/2021.  Sherry [name] is a 88 y.o. female with medical history significant of quadriplegia due to central cord  syndrome of C6 secondary to neck fracture, hypertension, depression with anxiety, neurogenic bladder, bilateral DVT on Xarelto (s/p of IVC filter placement), who presents with fall and hip pain.   Patient has history of quadriplegia, and has been doing PT/OT with improvement of function. Pt has some use of both arms and can feed herself. She can walk a little bit with assistance. Pt accidentally fell at about 930 this morning after taking shower.  She injured her hips. Hospital Course:    Closed left hip fracture [facility]) s/p mechanical fall Closed right hip fracture (?old) Acute blood loss anemia --pt is s/p  INTRAMEDULLARY (IM) NAIL FEMORAL-left on 7/16  PAIN:  Are you having pain? NO: NPRS scale: 0/10 Pain location: Right hip Pain description: Ache Aggravating factors: any active Movement of right side Relieving factors: Rest  PRECAUTIONS: Fall  WEIGHT BEARING RESTRICTIONS Yes Bilateral non-weight bearing B LE's  FALLS: Has patient fallen in last 6 months? Yes. Number of falls 1  LIVING ENVIRONMENT: Lives with: lives with their family and lives in a nursing home Lives in: Other Long term care at [place]: No Has following equipment at home: Wheelchair (power), Ramped entry, and hoyer lift  PLOF: Needs assistance with ADLs, Needs assistance with homemaking, Needs assistance with gait, Needs assistance with transfers, and Leisure: Work puzzles, read, Watch TV  PATIENT GOALS Patient goal is to walk; Be able to strengthen my arms for better use  OBJECTIVE:   DIAGNOSTIC FINDINGS: Overall cognitive statusEXAM: DG HIP (WITH OR WITHOUT PELVIS) 2-3V LEFT; DG HIP (WITH OR WITHOUT PELVIS) 2-3V RIGHT   COMPARISON:  CT of the abdomen and pelvis 01/21/2020   FINDINGS: Left proximal femur fracture noted just below the intertrochanteric region. Marked ventral angulation noted on the cross-table image. Slight varus angulation present   Comminuted intratrochanteric fracture  present on the right also with marked ventral angulation.   IMPRESSION: 1. Bilateral hip fractures. 2. Comminuted intertrochanteric fracture of the right hip. 3. Angulated fracture of the proximal left femur just below the intertrochanteric region.     Electronically Signed   By: San Morelle M.D.   On: 08/15/2021 11:54:   COGNITION:  Within functional limits for tasks assessed   SENSATION: Light touch: Impaired   EDEMA:  None  Strength R/L 2-/2- Shoulder flexion (anterior deltoid/pec major/coracobrachialis, axillary n. (C5-6) and musculocutaneous n. (C5-7)) 2-/3+ (within available ROM around 130 on lef)Shoulder abduction (deltoid/supraspinatus, axillary/suprascapular n, C5) 3+/3+ Shoulder external rotation (infraspinatus/teres minor) 4/4 Shoulder internal rotation (subcapularis/lats/pec major) 3+/3+ Shoulder extension (posterior deltoid, lats, teres major, axillary/thoracodorsal n.) 3+/3+ Shoulder horizontal abduction 4/4 Elbow flexion (biceps brachii, brachialis, brachioradialis, musculoskeletal n, C5-6) 4/4 Elbow extension (triceps, radial n, C7) 2-/2- Wrist Extension 3+/3+ Wrist Flexion   LOWER EXTREMITY MMT:    MMT Right 01/11/22 Left 01/11/22  Hip flexion 2- 2-  Hip extension 2- 2-  Hip abduction 2- 2-  Hip adduction 2- 3  Hip internal rotation 2- 2-  Hip external rotation 2- 2-  Knee flexion  2- 2-  Knee extension 2+ 3+  Ankle dorsiflexion 2- NT - boot  Ankle plantarflexion 2- NT- boot  Ankle inversion 2- NT- boot  Ankle eversion 2- NT- boot  (Blank rows = not tested)  BED MOBILITY:  Unable to test due to NWB status of BLE- Patient currently dependent with all bed mobility using hoyer and caregiver assist  TRANSFERS: Currently BLE NWB- Dependent on use of hoyer for all transfers  FUNCTIONAL TESTs:   PATIENT SURVEYS:  FOTO 12  TODAY'S TREATMENT:  THERE.EX:  Reassessment of remainder of goals. Pt unable to successfully maintain  standing today as pt is in a loaned power w/c with anteriorly angled foot rests which limits pt positioning LE/s under her BOS leading to feet sliding anteriorly and posterior bias needing maxA+2 with limited gluteal clearance. Thus standing goals limited in true understanding.   AAROM- B hip flex 2 sets x 12 reps AAROM- R LAQ - 2 sets of 15 reps  AROM- Left LAQ- 2 sets of 15 reps   B hip abd/add with heel on stool to perform AAROM: x20/LE    Pt educated throughout session about proper posture and technique with exercises. Improved exercise technique, movement at target joints, use of target muscles after min to mod verbal, visual, tactile cues.   PATIENT EDUCATION: Education details: PT plan of care/use of resistive bands Person educated: Patient Education method: Explanation, Demonstration, Tactile cues, and Verbal cues Education comprehension: verbalized understanding, returned demonstration, verbal cues required, tactile cues required, and needs further education   HOME EXERCISE PROGRAM: No changes at this time  Access Code: [facility] URL: [email or web address] Date: 11/23/2021 Prepared by: Janna Arch Exercises - Seated Gluteal Sets - 2 x daily - 7 x weekly - 2 sets - 10 reps - 5 hold - Seated Quad Set - 2 x daily - 7 x weekly - 2 sets - 10 reps - 5 hold - Seated Long Arc Quad - 2 x daily - 7 x weekly - 2 sets - 10 reps - 5 hold - Seated March - 2 x daily - 7 x weekly - 2 sets - 10 reps - 5 hold - Seated Hip Abduction - 2 x daily - 7 x weekly - 2 sets - 10 reps - 5 hold - Seated Shoulder Shrugs - 2 x daily - 7 x weekly - 2 sets - 10 reps - 5 hold - Wheelchair Pressure Relief - 2 x daily - 7 x weekly - 2 sets - 10 reps - 5 hold  GOALS: Goals reviewed with patient? Yes  SHORT TERM GOALS: Target date: 02/22/2022  Pt will be independent with initial UE strengthening HEP in order to improve strength and balance in order to decrease fall risk and improve function at home and  work. Baseline: 10/19/2021- patient with no formal UE HEP; 12/02/2021= Patient verbalized knowledge of HEP including use of theraband for UE strengthening.  Goal status: GOAL MET  LONG TERM GOALS: Target date: 06/30/2022  Pt will be independent with final for UE/LE HEP in order to improve strength and balance in order to decrease fall risk and improve function at home and work. Baseline: Patient is currently BLE NWB and unable to participate in HEP. Has order for UE strengthening. 01/11/2022- Patient now able to participate in LE strengthening although NWB still- good understanding for some basic exercises. Will keep goal active to incorporate progressive LE strengthening exercises. 04/07/2022= Patient still participating in progressive LE seated HEP and has no questions at  this time.  Goal status: MET  2.  Pt will improve FOTO to target score of 35  to display perceived improvements in ability to complete ADL's.  Baseline: 10/19/2021= 12; 12/02/2021= 17; 01/11/2022=17; 04/07/2022= 17 Goal status: Ongoing  3.  Pt will increase strength of B UE  by at least 1/2 MMT grade in order to demonstrate improvement in strength and function  Baseline: patient has range of 2-/5 to 4/5 BUE Strength; 12/02/2021= 4/5 except with wrist ext. 01/11/2022= 4/5 B UE strength expept for wrist Goal status: Partially met  4. Pt. Will increase strength of RLE by at least 1/2 MMT grade in order to demonstrate improvement in Standing/transfers.  Baseline: 2-/5 Right hip flex/knee ext/flex; 04/07/2022= 2- with right hip flex/knee ext (lacking 28 deg from zero)   Goal status: ONGOING- able to extend right knee more-closer to full ROM  5. Pt. Will demo ability to stand pivot transfer with max assist for improved functional mobility and less dependent need on mechanical device.   Baseline: Dependent on hoyer lift for all transfers; 04/07/2022- Unable to test secondary to patient with recent UTI and right hand procedure - will attempt to  reassess next visit. ; 04/12/22: Unable to successfully stand due to feet plates on loaned power w/c  Goal status: ONGOING  6. Pt. Will demonstrate improved functional LE strength as seen by ability to stand > 2 min for improved transfer ability and pregait abilities.   Baseline: not assessed today due to recent dx: UTI; 04/07/2022- Unable to test secondary to patient with recent UTI and right hand procedure - will attempt to reassess next visit; 04/12/22: Unable to successfully stand due to feet plates on loaned power w/c  Goal status: ONGOING  ASSESSMENT:  CLINICAL IMPRESSION:  Pt unable to tolerate standing this date due to loaned power w/c. Foot rests are not adjustable leading to pt having to stand with LE's anterior to pelvis and torso leading to posterior bias in standing needing maxA+2 with limited glut clearance and blocking her feet. Deferring standing goals until normal power w/c is present. Pt would greatly benefits from STS trials with STS lift for pt and therapist safety and improved standing tolerance. Pt will continue to benefit from skilled PT services to optimize independence and reduced caregiver support.    OBJECTIVE IMPAIRMENTS Abnormal gait, decreased activity tolerance, decreased balance, decreased coordination, decreased endurance, decreased mobility, difficulty walking, decreased ROM, decreased strength, hypomobility, impaired flexibility, impaired UE functional use, postural dysfunction, and pain.   ACTIVITY LIMITATIONS carrying, lifting, bending, sitting, standing, squatting, sleeping, stairs, transfers, bed mobility, continence, bathing, toileting, dressing, self feeding, reach over head, hygiene/grooming, and caring for others  PARTICIPATION LIMITATIONS: meal prep, cleaning, laundry, medication management, personal finances, interpersonal relationship, driving, shopping, community activity, and yard work  PERSONAL FACTORS Age, Time since onset of  injury/illness/exacerbation, and 1-2 comorbidities: HTN, cervical Sx  are also affecting patient's functional outcome.   REHAB POTENTIAL: Good  CLINICAL DECISION MAKING: Evolving/moderate complexity  EVALUATION COMPLEXITY: Moderate  PLAN: PT FREQUENCY: 1-2x/week  PT DURATION: 12 weeks  PLANNED INTERVENTIONS: Therapeutic exercises, Therapeutic activity, Neuromuscular re-education, Balance training, Gait training, Patient/Family education, Self Care, Joint mobilization, DME instructions, Dry Needling, Electrical stimulation, Wheelchair mobility training, Spinal mobilization, Cryotherapy, Moist heat, and Manual therapy  PLAN FOR NEXT SESSION: Continue with weight bearing activities and LE strengthening as appropriate.   Salem Caster. Fairly IV, PT, DPT Physical Therapist- [facility]  04/12/2022, 12:55 PM

## 2022-04-12 NOTE — Therapy (Signed)
Occupational Therapy Treatment Note      Patient Name: Sherry [name] MRN: [id number] DOB:[birth year], 88 y.o., female Today's Date: 04/12/2022                                         PCP: Dr. Viviana Simpler REFERRING PROVIDER: Dr. Viviana Simpler   OT End of Session - 04/12/22 1220     Visit Number [id number]    Number of Visits 50    Date for OT Re-Evaluation 06/30/22    Authorization Time Period Progress report period starting 12/14/2021    OT Start Time 1104    OT Stop Time 1145    OT Time Calculation (min) 41 min    Equipment Utilized During Treatment moist heat    Activity Tolerance Patient tolerated treatment well    Behavior During Therapy WFL for tasks assessed/performed                  Past Medical History:  Diagnosis Date   Acute blood loss anemia    Arthritis    Cancer ([place])    skin   Central cord syndrome at C6 level of cervical spinal cord ([place]) 11/29/2017   Hypertension    Protein-calorie malnutrition, severe ([place]) 01/24/2018   S/P insertion of IVC (inferior vena caval) filter 01/24/2018   Tetraparesis ([place])    Past Surgical History:  Procedure Laterality Date   ANTERIOR CERVICAL DECOMP/DISCECTOMY FUSION N/A 11/29/2017   Procedure: Cervical five-six, six-seven Anterior Cervical Decompression Fusion;  Surgeon: Judith Part, MD;  Location: [facility];  Service: Neurosurgery;  Laterality: N/A;  Cervical five-six, six-seven Anterior Cervical Decompression Fusion   CATARACT EXTRACTION     FEMUR IM NAIL Left 08/16/2021   Procedure: INTRAMEDULLARY (IM) NAIL FEMORAL;  Surgeon: Earnestine Leys, MD;  Location: [facility];  Service: Orthopedics;  Laterality: Left;   IR IVC FILTER PLMT / S&I /IMG GUID/MOD SED  12/08/2017   Patient Active Problem List   Diagnosis Date Noted   Hypertensive heart disease with other congestive heart failure ([place]) 02/14/2022   Chronic indwelling Foley catheter 02/14/2022   Closed hip fracture ([place]) 08/15/2021   DVT (deep venous  thrombosis) ([place]) 08/15/2021   Quadriplegia ([place]) 08/15/2021   Fall 08/15/2021   Constipation due to slow transit 08/31/2018   Trauma 06/05/2018   Neuropathic pain 06/05/2018   Neurogenic bowel 06/05/2018   Vaginal yeast infection 01/30/2018   Healthcare-associated pneumonia 01/25/2018   Chronic allergic rhinitis 01/24/2018   Depression with anxiety 01/24/2018   UTI due to Klebsiella species 01/24/2018   Protein-calorie malnutrition, severe ([place]) 01/24/2018   S/P insertion of IVC (inferior vena caval) filter 01/24/2018   Tetraparesis ([place]) 01/20/2018   Neurogenic bladder 01/20/2018   Reactive depression    Benign essential HTN    Acute postoperative anemia due to expected blood loss    Central cord syndrome at C6 level of cervical spinal cord ([place]) 11/29/2017   Allergy to alpha-gal 11/25/2016   SCC (squamous cell carcinoma) 04/08/2014    REFERRING DIAG: Central Cord Syndrome at C6 of the Cervical Spinal Cord, Fall with Bilateral Closed Hip Fractures, with ORIF repair with Intramedullary Nailing of the Left Hip Fracture.  THERAPY DIAG:  Muscle weakness (generalized)  Rationale for Evaluation and Treatment Rehabilitation  PERTINENT HISTORY: Patient s/p fall November 29, 2017 resulting in diagnosis of central cord syndrome at C 6 level.  She  has had therapy in multiple venues but with recent move to [facility], therapy staff recommended she seek outpatient therapy for her needs. Pt. Had a recent fall with Bilateral Closed Hip Fractures, with ORIF repair with Intramedullary Nailing of the Left Hip Fracture.   PRECAUTIONS: None  SUBJECTIVE:   Pt. reports doing well today.  PAIN:  Are you having pain? No     OBJECTIVE:   Manual Therapy:   Pt. tolerated soft tissue massage to the volar, and dorsal surface of each digit on the bilateral hands secondary to stiffness following moist heat modality. Soft tissue mobilization was performed for Carpal, and metacarpal spread stretches  for the left hand only.(Dermatological surgical site at the dorsal of the right hand still healing with a bandage present). Manual therapy was performed independent of, and in preparation for therapeutic Ex.   Therapeutic Ex.    Pt. tolerated PROM followed by AROM bilateral wrist extension, PROM for bilateral digit MP, PIP, and DIP flexion, and extension, thumb radial, and palmar abduction.    Pt. continues to make progress with bilateral UE strength, and continues to try to engage her bilateral hands more during daily ADL, and IADL tasks. Pt. continues to tolerate stretches and manual therapy well, without pain. Pt. continues to present with tightness at the bilateral thumb webspace, and bilateral wrist flexor tightness, as well as digit MP, PIP, and DIP extensor tightness which continues to limit her ability to complete ADL tasks efficiently. Pt. continues to benefit from working on impoving ROM in order to work towards increasing bilateral hand grasp on objects, and increase engagement of her bilateral hands during ADLs, and IADL tasks.       PATIENT EDUCATION:  Education details: BUE ROM, hand function, UE functioning Person educated: Patient Education method: Explanation and Demonstration Education comprehension: verbalized understanding, returned demonstration, and needs further education   HOME EXERCISE PROGRAM Continue with ongoing HEPs  Measurements:  12/09/2021:  Shoulder flexion: R: 120(136), L: 138(150) Shoulder abduction: R: 108(120), L: 110(120) Elbow: R: 0-148, L: 0-146 Wrist flexion: R: 55(62), L: 78 Writ extension: R: 34(55), L: 3(4) RD: R: 12(20), L: 16(26) UD: R: 8(24), L: 6(18) Thumb radial abduction: R: 11(20), L: 8(20) Digit flexion to the [facility]: R:  2nd: 5cm(3.5cm), 3rd: 7.5 cm(5cm), 4th: 7cm(3cm), 5th: 6cm(5.5cm) L: 2nd: 8cm(8cm), 3rd: 8cm(8cm), 4th: 7.5cm(7cm), 5th: 7cm(7cm)  02/03/2022:   Shoulder flexion: R: 122(138), L: 148(154) Shoulder abduction: R:  109(125), L: 125(125 Elbow: R: 0-148, L: 0-146 Wrist flexion: R: 60(68), L: 79 Writ extension: R: 40(55), L: 3(10) RD: R: 14(24), L: 20(28) UD: R: 8(24), L: 6(18) Thumb radial abduction: R: 20(25), L: 8(20) Digit flexion to the [facility]: R:  2nd: 4.5cm(3cm), 3rd: 6.5cm(4.5cm), 4th: 6.5 cm(3cm), 5th: 4.5cm(5cm) L: 2nd: 8cm(8cm), 3rd: 8cm(7cm), 4th: 7.5cm(7cm), 5th: 6.5cm(6.5cm)   04/07/2022:   Shoulder flexion: R: 125(150), L: 160(160) Shoulder abduction: R: 109(125), L: 125(125 Elbow: R: 0-148, L: 0-146 Wrist flexion: R: 60(68), L: 79 Writ extension: R: 40(55), L: 5(18) RD: R: 14(24), L: 20(28) UD: R: 18(24), L: 8(18) Thumb radial abduction: R: 20(25), L: 8(20) Digit flexion to the [facility]: R:  2nd: 4 cm(4cm), 3rd: 6.5cm(4cm), 4th: 7 cm (5cm), 5th: 6cm(5cm) L: 2nd: 8cm(8cm), 3rd: 8cm(7cm), 4th: 7.5cm(7cm), 5th: 7cm(7cm)           OT Long Term Goals - 07/06/21 1106  Target Date: 06/30/2022    OT LONG TERM GOAL #1   Title Patient and caregiver will demonstrate understanding of home exercise program for ROM.    Baseline 12/09/2021: Pt. Reports the restorative ROM program has not resumed at the facility. 10/28/2021: Pt. Is attempting exercises/stretches at home. The facility restorative aides have not resumed exercises. 09/14/2021:  Pt. Reports inconsistencies with restorative ROM at  her care home. Pt/caregiver education to be provided about ongoing ROM needs.    Time 12    Period Weeks    Status Deferred     OT LONG TERM GOAL #2   Title Pt.  will improve BUE strength to be able to sustain her UE's in elevation to efficiently, and effectively perform hair care, and self-grooming tasks.   Baseline 02/03/2022: Pt. has improved, and is now able to sustain her Bilateral UEs in elevation long enough to efficiently, and effectively perform haircare, and self-grooming tasks. 12/09/2021: right shoulder 4-/5,  left shoulder 4/5. Pt. has improved to be able to reach up for hair care, and self-grooming tasks. 10/28/2021: Right shoulder 3+/5, Left shoulder 4-/5 09/14/2021:  BUE shoulder strength: 3+/5 for shoulder flexion, and abduction.   Time 12    Period Weeks    Status Achieved               OT LONG TERM GOAL #3   Title Patient will demonstrate improved composite finger flexion to hold deodorant to apply to underarms.    Baseline 04/07/2022: Pt. Has improved with right 2nd, and 3rd digit flexion towards the [facility]. Pt. Continues to have difficulty securely holding and applying deodorant.02/03/2022: Pt. Has improved with digit flexion, however continues to have difficulty securely holding and using deoderant. 12/09/2021: Bilateral hand/digit MP, PIP, and DIP extension tightness limits her ability achieve digit flexion to hold, and apply deodorant. 10/28/2021:  Pt. Continues to have difficulty holding the deodorant. 01/12/2021: Pt. Presents with limited digit extension. Pt. Is able to initiate holding deodorant, however is unable to hold it while using it.   Time 12    Period Weeks    Status Ongoing   Target Date                  OT LONG TERM GOAL #4   Title Pt. Will improve bilateral wrist extension in preparation for anticipating, and initiating reaching for objects at the table.    Baseline 04/07/2022: Rght: 40(55) Left: 5(18) 02/03/2022: Right: 34(55) Left: 3(4) 12/09/2021: Right  34(55)  Left  3(4) 10/28/2021:  Right 22(38), Left 0(15) 09/14/2021:  Right: 17(35), left 2(15)   Time 12   Period weeks   Status Ongoing   Target Date      OT LONG TERM GOAL #5   Title Pt. will write, and sign her name with 100% legibility, and modified independence.    Baseline 04/07/2022: Pt. Reports that she continues to present with shaky appearing writing. Pt.reports that she is writing more when filling out her menu, and complete puzzles 02/03/2022: Pt. Continues to report legibility appears "shaky" when attempting signing  her name. Pt.reports that she is writing more when filling out her menu, and complete puzzles.12/09/2021: Pt. Reports legibility appears "shaky" when attempting signing her name. Pt.reports that she is writing more when filling out her menu. 10/28/2021: pt. is able to sign her name, however legibility continues to be inconsistent. Pt. Reports "shaky appearance". 09/14/2021: Pt. continues to fill out her daily menu and complete puzzles. Pt. Continues to present with  limited writing legibility.   Time 12    Period Weeks    Status  Ongoing         OT LONG TERM GOAL  #6   TITLE Pt. will turn pages in a book with modified independence.    Baseline 02/03/2022: Pt. Is able to effectively turn pages of a book. 10/28/2021: Pt. Is able to consistently turn book pages. 09/14/2021: Pt. has difficulty turning pages.    Time 12    Period Weeks    Status Achieved       OT LONG TERM GOAL  #7  TITLE Pt. Will increase  bilateral lateral pinch strength by 2 lbs to be able to securely grasp items during ADLs, and IADL tasks.   Baseline 04/07/2022: NT-Pinch meter out for calibration. 02/03/2022: Right: 6#, Left: 4# 12/09/2021:  Right: 6#, Left: 4#   Time 12  Period Weeks  Status Ongoing           OT LONG TERM GOAL  #8  TITLE Pt. Will button a sweater with large 1" buttons, and zip her jacket with modified independence.   Baseline 04/07/2022: Pt. Has difficulty manipulating buttons, however was introduced to a buttonhook. Pt. Was able to hold, and use a buttonhook for buttons on a shirt. 02/03/2022: Pt. is unable to manipulate buttons on her sweater, or zippers on her jacket.  Time 12  Period Weeks  Status Ongoing   OT LONG TERM GOAL  #9  TITLE Pt. will complete plant care with modified independence.  Baseline 04/07/2022: Pt. is now able to hold a cup and water her plants.02/03/2022: Pt. has difficulty caring for her plants.   Time 12  Period Weeks  Status Ongoing   OT LONG TERM GOAL  #10  TITLE Pt. will  demonstrate adaptive techniques to assist with the efficiency of self-dressing, or morning care tasks.  Baseline 04/07/2022: Pt. Continues to require assist with the efficiency of self-dressing,a nd morning care tasks.02/03/2022: Pt. requires assist from caregivers 2/2 time limitations during morning care.   Time 12  Period Weeks  Status Ongoing     Plan - 07/22/21 1737     Clinical Impression Statement Pt. continues to make progress with bilateral UE strength, and continues to try to engage her bilateral hands more during daily ADL, and IADL tasks. Pt. continues to tolerate stretches and manual therapy well, without pain. Pt. continues to present with tightness at the bilateral thumb webspace, and bilateral wrist flexor tightness, as well as digit MP, PIP, and DIP extensor tightness which continues to limit her ability to complete ADL tasks efficiently. Pt. continues to benefit from working on impoving ROM in order to work towards increasing bilateral hand grasp on objects, and increase engagement of her bilateral hands during ADLs, and IADL tasks.                       OT Occupational Profile and History Detailed Assessment- Review of Records and additional review of physical, cognitive, psychosocial history related to current functional performance    Occupational performance deficits (Please refer to evaluation for details): ADL's;IADL's;Leisure;Social Participation    Body Structure / Function / Physical Skills ADL;Continence;Dexterity;Flexibility;Strength;ROM;Balance;Coordination;FMC;IADL;Endurance;UE functional use;Decreased knowledge of use of DME;GMC    Psychosocial Skills Coping Strategies    Rehab Potential Fair    Clinical Decision Making Several treatment options, min-mod task modification necessary    Comorbidities Affecting Occupational Performance: Presence of comorbidities impacting occupational performance    Comorbidities impacting  occupational performance description:  contractures of bilateral hands, dependent transfers,    Modification or Assistance to Complete Evaluation  No modification of tasks or assist necessary to complete eval    OT Frequency 2x / week    OT Duration 12 weeks    OT Treatment/Interventions Self-care/ADL training;Cryotherapy;Therapeutic exercise;DME and/or AE instruction;Balance training;Neuromuscular education;Manual Therapy;Splinting;Moist Heat;Passive range of motion;Therapeutic activities;Patient/family education    Plan continue to progress ROM of digits, wrists and shoulders as it pertains to completion of ADL tasks that pt values.    Consulted and Agree with Plan of Care Patient            Harrel Carina, MS, OTR/L  Harrel Carina, OT 04/12/2022, 12:22 PM      `

## 2022-04-14 ENCOUNTER — Ambulatory Visit: Payer: Medicare HMO | Admitting: Occupational Therapy

## 2022-04-14 ENCOUNTER — Ambulatory Visit: Payer: Medicare HMO

## 2022-04-14 DIAGNOSIS — R2681 Unsteadiness on feet: Secondary | ICD-10-CM

## 2022-04-14 DIAGNOSIS — M6281 Muscle weakness (generalized): Secondary | ICD-10-CM | POA: Diagnosis not present

## 2022-04-14 DIAGNOSIS — R2689 Other abnormalities of gait and mobility: Secondary | ICD-10-CM

## 2022-04-14 DIAGNOSIS — S72001A Fracture of unspecified part of neck of right femur, initial encounter for closed fracture: Secondary | ICD-10-CM

## 2022-04-14 DIAGNOSIS — R262 Difficulty in walking, not elsewhere classified: Secondary | ICD-10-CM

## 2022-04-14 DIAGNOSIS — S14129S Central cord syndrome at unspecified level of cervical spinal cord, sequela: Secondary | ICD-10-CM

## 2022-04-14 DIAGNOSIS — R278 Other lack of coordination: Secondary | ICD-10-CM

## 2022-04-14 DIAGNOSIS — R269 Unspecified abnormalities of gait and mobility: Secondary | ICD-10-CM

## 2022-04-14 NOTE — Therapy (Signed)
Occupational Therapy Treatment Note      Patient Name: Sherry [name] MRN: [id number] DOB:11-27-1934, 88 y.o., female Today's Date: 04/14/2022                                         PCP: Dr. Viviana Simpler REFERRING PROVIDER: Dr. Viviana Simpler   OT End of Session - 04/14/22 1622     Visit Number [id number]    Number of Visits 24    Date for OT Re-Evaluation 06/30/22    OT Start Time 1100    OT Stop Time 1145    OT Time Calculation (min) 45 min    Activity Tolerance Patient tolerated treatment well    Behavior During Therapy [facility] for tasks assessed/performed                  Past Medical History:  Diagnosis Date   Acute blood loss anemia    Arthritis    Cancer ([place])    skin   Central cord syndrome at C6 level of cervical spinal cord ([place]) 11/29/2017   Hypertension    Protein-calorie malnutrition, severe ([place]) 01/24/2018   S/P insertion of IVC (inferior vena caval) filter 01/24/2018   Tetraparesis ([place])    Past Surgical History:  Procedure Laterality Date   ANTERIOR CERVICAL DECOMP/DISCECTOMY FUSION N/A 11/29/2017   Procedure: Cervical five-six, six-seven Anterior Cervical Decompression Fusion;  Surgeon: Judith Part, MD;  Location: [place];  Service: Neurosurgery;  Laterality: N/A;  Cervical five-six, six-seven Anterior Cervical Decompression Fusion   CATARACT EXTRACTION     FEMUR IM NAIL Left 08/16/2021   Procedure: INTRAMEDULLARY (IM) NAIL FEMORAL;  Surgeon: Earnestine Leys, MD;  Location: [facility];  Service: Orthopedics;  Laterality: Left;   IR IVC FILTER PLMT / S&I /IMG GUID/MOD SED  12/08/2017   Patient Active Problem List   Diagnosis Date Noted   Hypertensive heart disease with other congestive heart failure ([place]) 02/14/2022   Chronic indwelling Foley catheter 02/14/2022   Closed hip fracture ([place]) 08/15/2021   DVT (deep venous thrombosis) ([place]) 08/15/2021   Quadriplegia ([place]) 08/15/2021   Fall 08/15/2021   Constipation due to slow transit 08/31/2018    Trauma 06/05/2018   Neuropathic pain 06/05/2018   Neurogenic bowel 06/05/2018   Vaginal yeast infection 01/30/2018   Healthcare-associated pneumonia 01/25/2018   Chronic allergic rhinitis 01/24/2018   Depression with anxiety 01/24/2018   UTI due to Klebsiella species 01/24/2018   Protein-calorie malnutrition, severe ([place]) 01/24/2018   S/P insertion of IVC (inferior vena caval) filter 01/24/2018   Tetraparesis ([place]) 01/20/2018   Neurogenic bladder 01/20/2018   Reactive depression    Benign essential HTN    Acute postoperative anemia due to expected blood loss    Central cord syndrome at C6 level of cervical spinal cord ([place]) 11/29/2017   Allergy to alpha-gal 11/25/2016   SCC (squamous cell carcinoma) 04/08/2014    REFERRING DIAG: Central Cord Syndrome at C6 of the Cervical Spinal Cord, Fall with Bilateral Closed Hip Fractures, with ORIF repair with Intramedullary Nailing of the Left Hip Fracture.  THERAPY DIAG:  Muscle weakness (generalized)  Rationale for Evaluation and Treatment Rehabilitation  PERTINENT HISTORY: Patient s/p fall November 29, 2017 resulting in diagnosis of central cord syndrome at C 6 level.  She has had therapy in multiple venues but with recent move to [facility], therapy staff recommended she seek outpatient therapy  for her needs. Pt. Had a recent fall with Bilateral Closed Hip Fractures, with ORIF repair with Intramedullary Nailing of the Left Hip Fracture.   PRECAUTIONS: None  SUBJECTIVE:   Pt. reports doing well today.  PAIN:  Are you having pain? No     OBJECTIVE:   Manual Therapy:   Pt. tolerated soft tissue massage to the volar, and dorsal surface of each digit on the bilateral hands secondary to stiffness following moist heat modality. Soft tissue mobilization was performed for Carpal, and metacarpal spread stretches for the left hand only.(Dermatological surgical site at the dorsal of the right hand still healing with a bandage present). Manual  therapy was performed independent of, and in preparation for therapeutic Ex.   Therapeutic Ex.    Pt. tolerated PROM followed by AROM bilateral wrist extension, PROM for bilateral digit MP, PIP, and DIP flexion, and extension, thumb radial, and palmar abduction. Pt. worked on [employer], and reciprocal motion using the UBE while seated for 8 min. with minimal resistance. Constant monitoring was provided. 1# dumbbell ex for bilateral forearm supination, and wrist extension. Pt. 2# dowel for bilateral shoulder flexion, and chest presses.   Pt. continues to make progress with bilateral UE strength, and continues to try to engage her bilateral hands more during daily ADL, and IADL tasks. Pt. continues to tolerate stretches and manual therapy well, without pain. Pt. tolerated bilateral UE there. Ex. with the weighted dowel, and dumb bell. Pt. continues to present with tightness at the bilateral thumb webspace, and bilateral wrist flexor tightness, as well as digit MP, PIP, and DIP extensor tightness which continues to limit her ability to complete ADL tasks efficiently. Pt. continues to benefit from working on impoving ROM in order to work towards increasing bilateral hand grasp on objects, and increase engagement of her bilateral hands during ADLs, and IADL tasks.       PATIENT EDUCATION:  Education details: BUE ROM, hand function, UE functioning Person educated: Patient Education method: Explanation and Demonstration Education comprehension: verbalized understanding, returned demonstration, and needs further education   HOME EXERCISE PROGRAM Continue with ongoing HEPs  Measurements:  12/09/2021:  Shoulder flexion: R: 120(136), L: 138(150) Shoulder abduction: R: 108(120), L: 110(120) Elbow: R: 0-148, L: 0-146 Wrist flexion: R: 55(62), L: 78 Writ extension: R: 34(55), L: 3(4) RD: R: 12(20), L: 16(26) UD: R: 8(24), L: 6(18) Thumb radial abduction: R: 11(20), L: 8(20) Digit flexion  to the [facility]: R:  2nd: 5cm(3.5cm), 3rd: 7.5 cm(5cm), 4th: 7cm(3cm), 5th: 6cm(5.5cm) L: 2nd: 8cm(8cm), 3rd: 8cm(8cm), 4th: 7.5cm(7cm), 5th: 7cm(7cm)  02/03/2022:   Shoulder flexion: R: 122(138), L: 148(154) Shoulder abduction: R: 109(125), L: 125(125 Elbow: R: 0-148, L: 0-146 Wrist flexion: R: 60(68), L: 79 Writ extension: R: 40(55), L: 3(10) RD: R: 14(24), L: 20(28) UD: R: 8(24), L: 6(18) Thumb radial abduction: R: 20(25), L: 8(20) Digit flexion to the [facility]: R:  2nd: 4.5cm(3cm), 3rd: 6.5cm(4.5cm), 4th: 6.5 cm(3cm), 5th: 4.5cm(5cm) L: 2nd: 8cm(8cm), 3rd: 8cm(7cm), 4th: 7.5cm(7cm), 5th: 6.5cm(6.5cm)   04/07/2022:   Shoulder flexion: R: 125(150), L: 160(160) Shoulder abduction: R: 109(125), L: 125(125 Elbow: R: 0-148, L: 0-146 Wrist flexion: R: 60(68), L: 79 Writ extension: R: 40(55), L: 5(18) RD: R: 14(24), L: 20(28) UD: R: 18(24), L: 8(18) Thumb radial abduction: R: 20(25), L: 8(20) Digit flexion to the [facility]: R:  2nd: 4 cm(4cm), 3rd: 6.5cm(4cm), 4th: 7 cm (5cm), 5th: 6cm(5cm) L: 2nd: 8cm(8cm), 3rd: 8cm(7cm), 4th: 7.5cm(7cm), 5th: 7cm(7cm)  OT Long Term Goals - 07/06/21 1106                                                                                          Target Date: 06/30/2022    OT LONG TERM GOAL #1   Title Patient and caregiver will demonstrate understanding of home exercise program for ROM.    Baseline 12/09/2021: Pt. Reports the restorative ROM program has not resumed at the facility. 10/28/2021: Pt. Is attempting exercises/stretches at home. The facility restorative aides have not resumed exercises. 09/14/2021:  Pt. Reports inconsistencies with restorative ROM at  her care home. Pt/caregiver education to be provided about ongoing ROM needs.    Time 12    Period Weeks    Status Deferred     OT LONG TERM GOAL #2   Title Pt.  will improve BUE strength to be able to sustain her UE's in elevation to efficiently, and effectively perform hair care, and  self-grooming tasks.   Baseline 02/03/2022: Pt. has improved, and is now able to sustain her Bilateral UEs in elevation long enough to efficiently, and effectively perform haircare, and self-grooming tasks. 12/09/2021: right shoulder 4-/5, left shoulder 4/5. Pt. has improved to be able to reach up for hair care, and self-grooming tasks. 10/28/2021: Right shoulder 3+/5, Left shoulder 4-/5 09/14/2021:  BUE shoulder strength: 3+/5 for shoulder flexion, and abduction.   Time 12    Period Weeks    Status Achieved               OT LONG TERM GOAL #3   Title Patient will demonstrate improved composite finger flexion to hold deodorant to apply to underarms.    Baseline 04/07/2022: Pt. Has improved with right 2nd, and 3rd digit flexion towards the [facility]. Pt. Continues to have difficulty securely holding and applying deodorant.02/03/2022: Pt. Has improved with digit flexion, however continues to have difficulty securely holding and using deoderant. 12/09/2021: Bilateral hand/digit MP, PIP, and DIP extension tightness limits her ability achieve digit flexion to hold, and apply deodorant. 10/28/2021:  Pt. Continues to have difficulty holding the deodorant. 01/12/2021: Pt. Presents with limited digit extension. Pt. Is able to initiate holding deodorant, however is unable to hold it while using it.   Time 12    Period Weeks    Status Ongoing   Target Date                  OT LONG TERM GOAL #4   Title Pt. Will improve bilateral wrist extension in preparation for anticipating, and initiating reaching for objects at the table.    Baseline 04/07/2022: Rght: 40(55) Left: 5(18) 02/03/2022: Right: 34(55) Left: 3(4) 12/09/2021: Right  34(55)  Left  3(4) 10/28/2021:  Right 22(38), Left 0(15) 09/14/2021:  Right: 17(35), left 2(15)   Time 12   Period weeks   Status Ongoing   Target Date      OT LONG TERM GOAL #5   Title Pt. will write, and sign her name with 100% legibility, and modified independence.    Baseline 04/07/2022:  Pt. Reports that she continues to present with shaky appearing writing. Pt.reports  that she is writing more when filling out her menu, and complete puzzles 02/03/2022: Pt. Continues to report legibility appears "shaky" when attempting signing her name. Pt.reports that she is writing more when filling out her menu, and complete puzzles.12/09/2021: Pt. Reports legibility appears "shaky" when attempting signing her name. Pt.reports that she is writing more when filling out her menu. 10/28/2021: pt. is able to sign her name, however legibility continues to be inconsistent. Pt. Reports "shaky appearance". 09/14/2021: Pt. continues to fill out her daily menu and complete puzzles. Pt. Continues to present with limited writing legibility.   Time 12    Period Weeks    Status  Ongoing         OT LONG TERM GOAL  #6   TITLE Pt. will turn pages in a book with modified independence.    Baseline 02/03/2022: Pt. Is able to effectively turn pages of a book. 10/28/2021: Pt. Is able to consistently turn book pages. 09/14/2021: Pt. has difficulty turning pages.    Time 12    Period Weeks    Status Achieved       OT LONG TERM GOAL  #7  TITLE Pt. Will increase  bilateral lateral pinch strength by 2 lbs to be able to securely grasp items during ADLs, and IADL tasks.   Baseline 04/07/2022: NT-Pinch meter out for calibration. 02/03/2022: Right: 6#, Left: 4# 12/09/2021:  Right: 6#, Left: 4#   Time 12  Period Weeks  Status Ongoing           OT LONG TERM GOAL  #8  TITLE Pt. Will button a sweater with large 1" buttons, and zip her jacket with modified independence.   Baseline 04/07/2022: Pt. Has difficulty manipulating buttons, however was introduced to a buttonhook. Pt. Was able to hold, and use a buttonhook for buttons on a shirt. 02/03/2022: Pt. is unable to manipulate buttons on her sweater, or zippers on her jacket.  Time 12  Period Weeks  Status Ongoing   OT LONG TERM GOAL  #9  TITLE Pt. will complete plant care  with modified independence.  Baseline 04/07/2022: Pt. is now able to hold a cup and water her plants.02/03/2022: Pt. has difficulty caring for her plants.   Time 12  Period Weeks  Status Ongoing   OT LONG TERM GOAL  #10  TITLE Pt. will demonstrate adaptive techniques to assist with the efficiency of self-dressing, or morning care tasks.  Baseline 04/07/2022: Pt. Continues to require assist with the efficiency of self-dressing,a nd morning care tasks.02/03/2022: Pt. requires assist from caregivers 2/2 time limitations during morning care.   Time 12  Period Weeks  Status Ongoing     Plan - 07/22/21 1737     Clinical Impression Statement Pt. continues to make progress with bilateral UE strength, and continues to try to engage her bilateral hands more during daily ADL, and IADL tasks. Pt. continues to tolerate stretches and manual therapy well, without pain. Pt. tolerated bilateral UE there. Ex. with the weighted dowel, and dumb bell. Pt. continues to present with tightness at the bilateral thumb webspace, and bilateral wrist flexor tightness, as well as digit MP, PIP, and DIP extensor tightness which continues to limit her ability to complete ADL tasks efficiently. Pt. continues to benefit from working on impoving ROM in order to work towards increasing bilateral hand grasp on objects, and increase engagement of her bilateral hands during ADLs, and IADL tasks.  OT Occupational Profile and History Detailed Assessment- Review of Records and additional review of physical, cognitive, psychosocial history related to current functional performance    Occupational performance deficits (Please refer to evaluation for details): ADL's;IADL's;Leisure;Social Participation    Body Structure / Function / Physical Skills ADL;Continence;Dexterity;Flexibility;Strength;ROM;Balance;Coordination;FMC;IADL;Endurance;UE functional use;Decreased knowledge of use of DME;GMC    Psychosocial  Skills Coping Strategies    Rehab Potential Fair    Clinical Decision Making Several treatment options, min-mod task modification necessary    Comorbidities Affecting Occupational Performance: Presence of comorbidities impacting occupational performance    Comorbidities impacting occupational performance description: contractures of bilateral hands, dependent transfers,    Modification or Assistance to Complete Evaluation  No modification of tasks or assist necessary to complete eval    OT Frequency 2x / week    OT Duration 12 weeks    OT Treatment/Interventions Self-care/ADL training;Cryotherapy;Therapeutic exercise;DME and/or AE instruction;Balance training;Neuromuscular education;Manual Therapy;Splinting;Moist Heat;Passive range of motion;Therapeutic activities;Patient/family education    Plan continue to progress ROM of digits, wrists and shoulders as it pertains to completion of ADL tasks that pt values.    Consulted and Agree with Plan of Care Patient            Harrel Carina, MS, OTR/L  Harrel Carina, OT 04/14/2022, 4:25 PM      `

## 2022-04-14 NOTE — Therapy (Signed)
OUTPATIENT PHYSICAL THERAPY NEURO TREATMENT  Patient Name: Sherry [name] MRN: [id number] DOB:03/24/1934, 88 y.o., female Today's Date: 04/14/2022   PCP: Dewayne Shorter, MD REFERRING PROVIDER: Earnestine Leys   PT End of Session - 04/14/22 1202     Visit Number 36    Number of Visits 58    Date for PT Re-Evaluation 06/30/22    Authorization Time Period Initial cert [id number]; Recert [id number]; Recert [id number]    Progress Note Due on Visit 14    PT Start Time 1147    Equipment Utilized During Treatment Gait belt    Activity Tolerance Patient tolerated treatment well    Behavior During Therapy [facility] for tasks assessed/performed                           Past Medical History:  Diagnosis Date   Acute blood loss anemia    Arthritis    Cancer ([place])    skin   Central cord syndrome at C6 level of cervical spinal cord ([place]) 11/29/2017   Hypertension    Protein-calorie malnutrition, severe ([place]) 01/24/2018   S/P insertion of IVC (inferior vena caval) filter 01/24/2018   Tetraparesis ([place])    Past Surgical History:  Procedure Laterality Date   ANTERIOR CERVICAL DECOMP/DISCECTOMY FUSION N/A 11/29/2017   Procedure: Cervical five-six, six-seven Anterior Cervical Decompression Fusion;  Surgeon: Judith Part, MD;  Location: [place];  Service: Neurosurgery;  Laterality: N/A;  Cervical five-six, six-seven Anterior Cervical Decompression Fusion   CATARACT EXTRACTION     FEMUR IM NAIL Left 08/16/2021   Procedure: INTRAMEDULLARY (IM) NAIL FEMORAL;  Surgeon: Earnestine Leys, MD;  Location: [facility];  Service: Orthopedics;  Laterality: Left;   IR IVC FILTER PLMT / S&I /IMG GUID/MOD SED  12/08/2017   Patient Active Problem List   Diagnosis Date Noted   Hypertensive heart disease with other congestive heart failure ([place]) 02/14/2022   Chronic indwelling Foley catheter 02/14/2022   Closed hip fracture ([place]) 08/15/2021   DVT (deep venous  thrombosis) ([place]) 08/15/2021   Quadriplegia ([place]) 08/15/2021   Fall 08/15/2021   Constipation due to slow transit 08/31/2018   Trauma 06/05/2018   Neuropathic pain 06/05/2018   Neurogenic bowel 06/05/2018   Vaginal yeast infection 01/30/2018   Healthcare-associated pneumonia 01/25/2018   Chronic allergic rhinitis 01/24/2018   Depression with anxiety 01/24/2018   UTI due to Klebsiella species 01/24/2018   Protein-calorie malnutrition, severe ([place]) 01/24/2018   S/P insertion of IVC (inferior vena caval) filter 01/24/2018   Tetraparesis ([place]) 01/20/2018   Neurogenic bladder 01/20/2018   Reactive depression    Benign essential HTN    Acute postoperative anemia due to expected blood loss    Central cord syndrome at C6 level of cervical spinal cord ([place]) 11/29/2017   Allergy to alpha-gal 11/25/2016   SCC (squamous cell carcinoma) 04/08/2014    ONSET DATE: 08/15/2021 (fall with B hip fx); Initial injury was in 2019- quadraplegia due to central cord syndrome of C6 secondary to neck fracture.   REFERRING DIAG: Bilateral Hip fx; Left Open- s/p Left IM nail femoral on 08/16/2021; Right closed  THERAPY DIAG:  Muscle weakness (generalized)  Other lack of coordination  Abnormality of gait and mobility  Difficulty in walking, not elsewhere classified  Other abnormalities of gait and mobility  Closed fracture of both hips, initial encounter ([place])  Unsteadiness on feet  Central cord syndrome, sequela ([place])  Rationale for Evaluation and Treatment Rehabilitation  SUBJECTIVE:  SUBJECTIVE STATEMENT:    Patient reports continuing to feel better and recovering well from recent illness.    Pt accompanied by:  Paid caregiver-   PERTINENT HISTORY: Patient was previous patient (see previous episode for specifics) She experienced a fall at home on 08/15/2021 and was in [facility] from 08/15/2021- 08/20/2021.  Most recent history per Dr. Laurey Arrow on 08/20/2021.  Sherry [name] is a 88  y.o. 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   Patient has history of quadriplegia, and has been doing PT/OT with improvement of function. Pt has some use of both arms and can feed herself. She can walk a little bit with assistance. Pt accidentally fell at about 930 this morning after taking shower.  She injured her hips. Hospital Course:    Closed left hip fracture [facility]) s/p mechanical fall Closed right hip fracture (?old) Acute blood loss anemia --pt is s/p  INTRAMEDULLARY (IM) NAIL FEMORAL-left on 7/16  PAIN:  Are you having pain? NO: NPRS scale: 0/10 Pain location: Right hip Pain description: Ache Aggravating factors: any active Movement of right side Relieving factors: Rest  PRECAUTIONS: Fall  WEIGHT BEARING RESTRICTIONS Yes Bilateral non-weight bearing B LE's  FALLS: Has patient fallen in last 6 months? Yes. Number of falls 1  LIVING ENVIRONMENT: Lives with: lives with their family and lives in a nursing home Lives in: Other Long term care at [place]: No Has following equipment at home: Wheelchair (power), Ramped entry, and hoyer lift  PLOF: Needs assistance with ADLs, Needs assistance with homemaking, Needs assistance with gait, Needs assistance with transfers, and Leisure: Work puzzles, read, Watch TV  PATIENT GOALS Patient goal is to walk; Be able to strengthen my arms for better use  OBJECTIVE:   DIAGNOSTIC FINDINGS: Overall cognitive statusEXAM: DG HIP (WITH OR WITHOUT PELVIS) 2-3V LEFT; DG HIP (WITH OR WITHOUT PELVIS) 2-3V RIGHT   COMPARISON:  CT of the abdomen and pelvis 01/21/2020   FINDINGS: Left proximal femur fracture noted just below the intertrochanteric region. Marked ventral angulation noted on the cross-table  image. Slight varus angulation present   Comminuted intratrochanteric fracture present on the right also with marked ventral angulation.   IMPRESSION: 1. Bilateral hip fractures. 2. Comminuted intertrochanteric fracture of the right hip. 3. Angulated fracture of the proximal left femur just below the intertrochanteric region.     Electronically Signed   By: San Morelle M.D.   On: 08/15/2021 11:54:   COGNITION:  Within functional limits for tasks assessed   SENSATION: Light touch: Impaired   EDEMA:  None  Strength R/L 2-/2- Shoulder flexion (anterior deltoid/pec major/coracobrachialis, axillary n. (C5-6) and musculocutaneous n. (C5-7)) 2-/3+ (within available ROM around 130 on lef)Shoulder abduction (deltoid/supraspinatus, axillary/suprascapular n, C5) 3+/3+ Shoulder external rotation (infraspinatus/teres minor) 4/4 Shoulder internal rotation (subcapularis/lats/pec major) 3+/3+ Shoulder extension (posterior deltoid, lats, teres major, axillary/thoracodorsal n.) 3+/3+ Shoulder horizontal abduction 4/4 Elbow flexion (biceps brachii, brachialis, brachioradialis, musculoskeletal n, C5-6) 4/4 Elbow extension (triceps, radial n, C7) 2-/2- Wrist Extension 3+/3+ Wrist Flexion   LOWER EXTREMITY MMT:    MMT Right 01/11/22 Left 01/11/22  Hip flexion 2- 2-  Hip extension 2- 2-  Hip abduction 2- 2-  Hip adduction 2- 3  Hip internal rotation 2- 2-  Hip external rotation 2- 2-  Knee  flexion 2- 2-  Knee extension 2+ 3+  Ankle dorsiflexion 2- NT - boot  Ankle plantarflexion 2- NT- boot  Ankle inversion 2- NT- boot  Ankle eversion 2- NT- boot  (Blank rows = not tested)  BED MOBILITY:  Unable to test due to NWB status of BLE- Patient currently dependent with all bed mobility using hoyer and caregiver assist  TRANSFERS: Currently BLE NWB- Dependent on use of hoyer for all transfers  FUNCTIONAL TESTs:   PATIENT SURVEYS:  FOTO 12  TODAY'S  TREATMENT:  THERE.EX:  AAROM- B hip flex 2 sets x 12 reps AAROM- R LAQ - 2 sets of 15 reps  AROM- Left LAQ- 2 sets of 15 reps   B hip abd/add with heel on stool to perform AAROM: x20/LE  Passive Stretching to each LE (ankle and later knee flex/ext) x several min  Manual resistive Leg press 2 sets of 10 reps   Pt educated throughout session about proper posture and technique with exercises. Improved exercise technique, movement at target joints, use of target muscles after min to mod verbal, visual, tactile cues.   PATIENT EDUCATION: Education details: PT plan of care/use of resistive bands Person educated: Patient Education method: Explanation, Demonstration, Tactile cues, and Verbal cues Education comprehension: verbalized understanding, returned demonstration, verbal cues required, tactile cues required, and needs further education   HOME EXERCISE PROGRAM: No changes at this time  Access Code: [facility] URL: [email or web address] Date: 11/23/2021 Prepared by: Janna Arch Exercises - Seated Gluteal Sets - 2 x daily - 7 x weekly - 2 sets - 10 reps - 5 hold - Seated Quad Set - 2 x daily - 7 x weekly - 2 sets - 10 reps - 5 hold - Seated Long Arc Quad - 2 x daily - 7 x weekly - 2 sets - 10 reps - 5 hold - Seated March - 2 x daily - 7 x weekly - 2 sets - 10 reps - 5 hold - Seated Hip Abduction - 2 x daily - 7 x weekly - 2 sets - 10 reps - 5 hold - Seated Shoulder Shrugs - 2 x daily - 7 x weekly - 2 sets - 10 reps - 5 hold - Wheelchair Pressure Relief - 2 x daily - 7 x weekly - 2 sets - 10 reps - 5 hold  GOALS: Goals reviewed with patient? Yes  SHORT TERM GOALS: Target date: 02/22/2022  Pt will be independent with initial UE strengthening HEP in order to improve strength and balance in order to decrease fall risk and improve function at home and work. Baseline: 10/19/2021- patient with no formal UE HEP; 12/02/2021= Patient verbalized knowledge of HEP including use of theraband  for UE strengthening.  Goal status: GOAL MET  LONG TERM GOALS: Target date: 06/30/2022  Pt will be independent with final for UE/LE HEP in order to improve strength and balance in order to decrease fall risk and improve function at home and work. Baseline: Patient is currently BLE NWB and unable to participate in HEP. Has order for UE strengthening. 01/11/2022- Patient now able to participate in LE strengthening although NWB still- good understanding for some basic exercises. Will keep goal active to incorporate progressive LE strengthening exercises. 04/07/2022= Patient still participating in progressive LE seated HEP and has no questions at this time.  Goal status: MET  2.  Pt will improve FOTO to target score of 35  to display perceived improvements in ability to complete ADL's.  Baseline: 10/19/2021= 12;  12/02/2021= 17; 01/11/2022=17; 04/07/2022= 17 Goal status: Ongoing  3.  Pt will increase strength of B UE  by at least 1/2 MMT grade in order to demonstrate improvement in strength and function  Baseline: patient has range of 2-/5 to 4/5 BUE Strength; 12/02/2021= 4/5 except with wrist ext. 01/11/2022= 4/5 B UE strength expept for wrist Goal status: Partially met  4. Pt. Will increase strength of RLE by at least 1/2 MMT grade in order to demonstrate improvement in Standing/transfers.  Baseline: 2-/5 Right hip flex/knee ext/flex; 04/07/2022= 2- with right hip flex/knee ext (lacking 28 deg from zero)   Goal status: ONGOING- able to extend right knee more-closer to full ROM  5. Pt. Will demo ability to stand pivot transfer with max assist for improved functional mobility and less dependent need on mechanical device.   Baseline: Dependent on hoyer lift for all transfers; 04/07/2022- Unable to test secondary to patient with recent UTI and right hand procedure - will attempt to reassess next visit. ; 04/12/22: Unable to successfully stand due to feet plates on loaned power w/c  Goal status: ONGOING  6. Pt.  Will demonstrate improved functional LE strength as seen by ability to stand > 2 min for improved transfer ability and pregait abilities.   Baseline: not assessed today due to recent dx: UTI; 04/07/2022- Unable to test secondary to patient with recent UTI and right hand procedure - will attempt to reassess next visit; 04/12/22: Unable to successfully stand due to feet plates on loaned power w/c  Goal status: ONGOING  ASSESSMENT:  CLINICAL IMPRESSION:  Treatment continued to focus on LE strengthening as she is unable to practice standing in her current w/c due to angle of footrest. She was able to copmplete most reps- slightly more difficulty with right LE with less power with leg press and LAQ. Pt will continue to benefit from skilled PT services to optimize independence and reduced caregiver support.    OBJECTIVE IMPAIRMENTS Abnormal gait, decreased activity tolerance, decreased balance, decreased coordination, decreased endurance, decreased mobility, difficulty walking, decreased ROM, decreased strength, hypomobility, impaired flexibility, impaired UE functional use, postural dysfunction, and pain.   ACTIVITY LIMITATIONS carrying, lifting, bending, sitting, standing, squatting, sleeping, stairs, transfers, bed mobility, continence, bathing, toileting, dressing, self feeding, reach over head, hygiene/grooming, and caring for others  PARTICIPATION LIMITATIONS: meal prep, cleaning, laundry, medication management, personal finances, interpersonal relationship, driving, shopping, community activity, and yard work  PERSONAL FACTORS Age, Time since onset of injury/illness/exacerbation, and 1-2 comorbidities: HTN, cervical Sx  are also affecting patient's functional outcome.   REHAB POTENTIAL: Good  CLINICAL DECISION MAKING: Evolving/moderate complexity  EVALUATION COMPLEXITY: Moderate  PLAN: PT FREQUENCY: 1-2x/week  PT DURATION: 12 weeks  PLANNED INTERVENTIONS: Therapeutic exercises, Therapeutic  activity, Neuromuscular re-education, Balance training, Gait training, Patient/Family education, Self Care, Joint mobilization, DME instructions, Dry Needling, Electrical stimulation, Wheelchair mobility training, Spinal mobilization, Cryotherapy, Moist heat, and Manual therapy  PLAN FOR NEXT SESSION: Continue with weight bearing activities and LE strengthening as appropriate.   Ollen Bowl, PT Physical Therapist- [facility]  04/14/2022, 12:03 PM

## 2022-04-15 ENCOUNTER — Ambulatory Visit: Payer: Medicare HMO | Admitting: Occupational Therapy

## 2022-04-19 ENCOUNTER — Ambulatory Visit: Payer: Medicare HMO | Admitting: Occupational Therapy

## 2022-04-19 ENCOUNTER — Non-Acute Institutional Stay (SKILLED_NURSING_FACILITY): Payer: Medicare HMO | Admitting: Student

## 2022-04-19 ENCOUNTER — Encounter: Payer: Self-pay | Admitting: Student

## 2022-04-19 ENCOUNTER — Ambulatory Visit: Payer: Medicare HMO

## 2022-04-19 DIAGNOSIS — S4992XA Unspecified injury of left shoulder and upper arm, initial encounter: Secondary | ICD-10-CM | POA: Diagnosis not present

## 2022-04-19 NOTE — Progress Notes (Unsigned)
Location:  Other [place].  Nursing Home Room Number: [place] Place of Service:  SNF [phone]) Provider:  Dewayne Shorter, MD  Patient Care Team: Dewayne Shorter, MD as PCP - General [facility])  Extended Emergency Contact Information Primary Emergency Contact: [name] Mobile Phone: [phone] Relation: Daughter Secondary Emergency Contact: Lindi Adie Address: [address] [place]          [place], [place] [address] Johnnette Litter of [place] Phone: [phone] Relation: Spouse  Code Status:  DNR [facility] has Full Code) Goals of care: Advanced Directive information    04/19/2022   11:09 AM  Advanced Directives  Does Patient Have a Medical Advance Directive? Yes  Type of Paramedic of [place];Out of facility DNR (pink MOST or yellow form);Living will  Does patient want to make changes to medical advance directive? No - Patient declined  Copy of [place] in Chart? Yes - validated most recent copy scanned in chart (See row information)     Chief Complaint  Patient presents with   Acute Visit    Skin Tear.     HPI:  Pt is a 88 y.o. female seen today for an acute visit for skin tear of left forearm. Patient was wheeling to the sunroom and her arm got stuck. She has significant swelling and bruising of the arm. She has needed more pain medication.   X-ray in facility negative for fracture or dislocation.   Spoke with her daughter due to concern for potential muscle injury. At this time, she has pulses and uncertain of expansion of brusing, will defer ED eval at this time. If expanding hematoma will need to have ED evaluation. Hold blood thinners for 5 days. MRI to evaluate for torn muscle injury.    Past Medical History:  Diagnosis Date   Acute blood loss anemia    Arthritis    Cancer ([place])    skin   Central cord syndrome at C6 level of cervical spinal cord ([place]) 11/29/2017   Hypertension     Protein-calorie malnutrition, severe ([place]) 01/24/2018   S/P insertion of IVC (inferior vena caval) filter 01/24/2018   Tetraparesis ([place])    Past Surgical History:  Procedure Laterality Date   ANTERIOR CERVICAL DECOMP/DISCECTOMY FUSION N/A 11/29/2017   Procedure: Cervical five-six, six-seven Anterior Cervical Decompression Fusion;  Surgeon: Judith Part, MD;  Location: [place];  Service: Neurosurgery;  Laterality: N/A;  Cervical five-six, six-seven Anterior Cervical Decompression Fusion   CATARACT EXTRACTION     FEMUR IM NAIL Left 08/16/2021   Procedure: INTRAMEDULLARY (IM) NAIL FEMORAL;  Surgeon: Earnestine Leys, MD;  Location: [facility];  Service: Orthopedics;  Laterality: Left;   IR IVC FILTER PLMT / S&I /IMG GUID/MOD SED  12/08/2017    Allergies  Allergen Reactions   Other Swelling   Lisinopril-Hydrochlorothiazide Swelling    Swelling of the tongue   Beef-Derived Products Swelling    Facial and lip swelling (due to tick bite)   Dairy Aid [Tilactase] Swelling   Lisinopril-Hydrochlorothiazide Swelling    Tongue swelling   Pork-Derived Products Swelling    Outpatient Encounter Medications as of 04/19/2022  Medication Sig   acetaminophen (TYLENOL) 500 MG tablet Take 500 mg by mouth every 12 (twelve) hours as needed for mild pain.   acetaminophen (TYLENOL) 650 MG CR tablet Take 650 mg by mouth every 8 (eight) hours as needed for pain.   acetic acid 0.25 % irrigation Use 46ml via irrigation twice daily to  keep foley Cath patent.   alendronate (FOSAMAX) 70 MG tablet Take 70 mg by mouth every Saturday.   calcium carbonate (OSCAL) 1500 (600 Ca) MG TABS tablet Take 1 tablet by mouth daily with breakfast.   Capsaicin 0.1 % CREA Apply 1 application  topically every evening. (Apply to hands)   carboxymethylcellulose (REFRESH PLUS) 0.5 % SOLN Place 2 drops into both eyes every 6 (six) hours as needed (dry eyes).   cetirizine (ZYRTEC) 5 MG tablet Take 5 mg by mouth daily.   cholecalciferol  (VITAMIN D3) 25 MCG (1000 UNIT) tablet Take 1,000 Units by mouth daily.   DULoxetine (CYMBALTA) 60 MG capsule Take 60 mg by mouth daily.   EPINEPHrine 0.3 mg/0.3 mL IJ SOAJ injection Inject 0.3 mg into the muscle as needed for anaphylaxis.   ferrous sulfate 325 (65 FE) MG tablet Take 325 mg by mouth 2 (two) times daily with a meal.   gabapentin (NEURONTIN) 100 MG capsule Take 1 capsule (100 mg total) by mouth daily.   guaiFENesin (MUCINEX) 600 MG 12 hr tablet Take 600 mg by mouth 2 (two) times daily as needed for to loosen phlegm.   Infant Care Products [facility]) OINT Apply to buttocks/gluteal folds topically every shift.   lactulose, encephalopathy, (CHRONULAC) 10 GM/15ML SOLN Take 30 mLs by mouth daily as needed (mild constipation).   magnesium citrate solution Take 60-120 mLs by mouth daily as needed (constipation).   methylcellulose oral powder Take 1 packet by mouth at bedtime.   mometasone (ELOCON) 0.1 % lotion Apply 1 Application topically every Saturday at 6 PM. (Apply to the ear canal for eczema)   Multiple Vitamin (MULTIVITAMIN WITH MINERALS) TABS tablet Take 1 tablet by mouth daily.   polyethylene glycol (MIRALAX / GLYCOLAX) 17 g packet Take 17 g by mouth 2 (two) times daily.   rivaroxaban (XARELTO) 20 MG TABS tablet Take 1 tablet (20 mg total) by mouth daily with supper.   saccharomyces boulardii (FLORASTOR) 250 MG capsule Take 250 mg by mouth daily.   Saline Spray 0.2 % SOLN Place 2 sprays into both nostrils as needed (nasal congestion).   simethicone (MYLICON) 80 MG chewable tablet Chew 80 mg by mouth every 6 (six) hours as needed for flatulence.   sodium phosphate (FLEET) 7-19 GM/118ML ENEM Place 1 enema rectally every other day as needed for severe constipation.   spironolactone (ALDACTONE) 25 MG tablet Take 1 tablet (25 mg total) by mouth daily.   tiZANidine (ZANAFLEX) 2 MG tablet Take 1 tablet (2 mg total) by mouth 2 (two) times daily.   traMADol (ULTRAM) 50 MG tablet Take 50  mg by mouth every 12 (twelve) hours as needed.   triamcinolone cream (KENALOG) 0.1 % Apply to thigh and lower legs topically every 8 hours as needed. Apply to back of neck topically as needed.   [DISCONTINUED] nystatin cream (MYCOSTATIN) Apply 1 Application topically daily.   [DISCONTINUED] sulfamethoxazole-trimethoprim (BACTRIM DS) 800-160 MG tablet Take 1 tablet by mouth 2 (two) times daily. For 10 days   No facility-administered encounter medications on file as of 04/19/2022.    Review of Systems  Immunization History  Administered Date(s) Administered   Influenza, High Dose Seasonal PF 11/17/2021   Influenza-Unspecified 11/14/2018, 11/20/2019, 11/17/2020   Moderna Covid-19 Vaccine Bivalent Booster 62yrs & up 06/30/2021, 12/11/2021   Moderna Sars-Covid-2 Vaccination 02/09/2019, 03/09/2019, 12/14/2019, 06/20/2020, 10/24/2020   PPD Test 05/16/2019   Tdap 11/29/2017   Pertinent  Health Maintenance Due  Topic Date Due   INFLUENZA  VACCINE  Completed   DEXA SCAN  Discontinued      08/18/2021    8:00 PM 08/19/2021   10:00 AM 08/19/2021    8:54 PM 08/20/2021    8:20 AM 03/29/2022   10:11 AM  Fall Risk  Falls in the past year?     0  Was there an injury with Fall?     0  Fall Risk Category Calculator     0  (RETIRED) Patient Fall Risk Level High fall risk High fall risk High fall risk High fall risk   Patient at Risk for Falls Due to     No Fall Risks   Functional Status Survey:    Vitals:   04/19/22 1058  BP: 120/68  Pulse: 76  Resp: 18  Temp: (!) 97.5 F (36.4 C)  SpO2: 97%  Weight: 174 lb 12.8 oz (79.3 kg)  Height: 5\' 7"  (1.702 m)   Body mass index is 27.38 kg/m. Physical Exam Constitutional:      Appearance: Normal appearance.  Cardiovascular:     Rate and Rhythm: Normal rate.  Pulmonary:     Effort: Pulmonary effort is normal.  Abdominal:     Palpations: Abdomen is soft.  Musculoskeletal:     Comments: Left anterior shoulder with significant soft tissue  swelling Brusiing of the left arm, breast. Some additional bruising has spread across anterior chest wall. 4/5 strength bilateral upper extremities with triceps, biceps, and shoulders.   Neurological:     Mental Status: She is alert and oriented to person, place, and time.     Labs reviewed: Recent Labs    08/18/21 0423 08/19/21 0333 08/20/21 0443 09/24/21 0000 02/08/22 0000 03/08/22 0000  NA 124* 130* 130* 137 137 133*  K 4.6 5.1 4.5 4.7 4.8 4.0  CL 95* 102 101 101 98* 97*  CO2 23 23 26  30* 33* 28*  GLUCOSE 117* 117* 101*  --   --   --   BUN 29* 23 15 11 10 11   CREATININE 0.96 0.72 0.54 0.5 0.5 0.6  CALCIUM 7.1* 7.4* 7.1* 8.5* 9.0 8.0*   Recent Labs    09/24/21 0000 02/08/22 0000  AST  --  18  ALT  --  14  ALKPHOS  --  65  ALBUMIN 3.2* 3.7   Recent Labs    08/18/21 0423 08/18/21 1759 08/19/21 0333 08/20/21 0443 08/20/21 1029 02/08/22 0000 02/25/22 0000 03/08/22 0000  WBC 13.0*  --  10.6* 8.9   < > 5.5 5.1 6.2  NEUTROABS  --   --   --   --   --  3,036.00 2,800.00 3,683.00  HGB 7.7*   < > 8.9* 7.8*   < > 11.7* 10.4* 11.9*  HCT 22.7*   < > 27.5* 23.7*   < > 35* 32* 35*  MCV 87.6  --  88.4 87.1  --   --   --   --   PLT 180  --  198 216   < > 321 318 360   < > = values in this interval not displayed.   No results found for: "TSH" No results found for: "HGBA1C" No results found for: "CHOL", "HDL", "LDLCALC", "LDLDIRECT", "TRIG", "CHOLHDL"  Significant Diagnostic Results in last 30 days:  No results found.  Assessment/Plan Injury of left shoulder, initial encounter - Plan: MR SHOULDER LEFT W CONTRAST Patient with significant bruising and swleling of left arm. ROM intact and at baseline 2+ pulses in left arm. Marked  bruising. Nursing to evaluate q8 hours for expansion of bruising. If worsening will need ED eval for possible embolization vs surgery. Patient is aware of this plan and okay with proceeding with these interventions if indicated. Ice, elevation as  tolerated   Family/ staff Communication: nursing, HCPOA  Labs/tests ordered:  CBC

## 2022-04-21 ENCOUNTER — Ambulatory Visit: Payer: Medicare HMO | Admitting: Occupational Therapy

## 2022-04-21 ENCOUNTER — Emergency Department
Admission: EM | Admit: 2022-04-21 | Discharge: 2022-04-21 | Disposition: A | Discharge status: Home / Self Care | Payer: Medicare HMO | Attending: Emergency Medicine | Admitting: Emergency Medicine

## 2022-04-21 ENCOUNTER — Encounter: Payer: Self-pay | Admitting: Student

## 2022-04-21 ENCOUNTER — Emergency Department: Payer: Medicare HMO

## 2022-04-21 ENCOUNTER — Non-Acute Institutional Stay (SKILLED_NURSING_FACILITY): Payer: Medicare HMO | Admitting: Student

## 2022-04-21 ENCOUNTER — Ambulatory Visit: Payer: Medicare HMO

## 2022-04-21 ENCOUNTER — Other Ambulatory Visit: Payer: Self-pay

## 2022-04-21 DIAGNOSIS — J9811 Atelectasis: Secondary | ICD-10-CM | POA: Insufficient documentation

## 2022-04-21 DIAGNOSIS — Z7901 Long term (current) use of anticoagulants: Secondary | ICD-10-CM | POA: Diagnosis not present

## 2022-04-21 DIAGNOSIS — T148XXA Other injury of unspecified body region, initial encounter: Secondary | ICD-10-CM | POA: Diagnosis not present

## 2022-04-21 DIAGNOSIS — Q6102 Congenital multiple renal cysts: Secondary | ICD-10-CM | POA: Insufficient documentation

## 2022-04-21 DIAGNOSIS — S4992XA Unspecified injury of left shoulder and upper arm, initial encounter: Secondary | ICD-10-CM | POA: Diagnosis present

## 2022-04-21 DIAGNOSIS — M25512 Pain in left shoulder: Secondary | ICD-10-CM

## 2022-04-21 DIAGNOSIS — W228XXA Striking against or struck by other objects, initial encounter: Secondary | ICD-10-CM | POA: Insufficient documentation

## 2022-04-21 DIAGNOSIS — I517 Cardiomegaly: Secondary | ICD-10-CM | POA: Insufficient documentation

## 2022-04-21 DIAGNOSIS — J90 Pleural effusion, not elsewhere classified: Secondary | ICD-10-CM | POA: Diagnosis not present

## 2022-04-21 DIAGNOSIS — D649 Anemia, unspecified: Secondary | ICD-10-CM

## 2022-04-21 DIAGNOSIS — S40022A Contusion of left upper arm, initial encounter: Secondary | ICD-10-CM | POA: Diagnosis not present

## 2022-04-21 LAB — BASIC METABOLIC PANEL
Anion gap: 3 — ABNORMAL LOW (ref 5–15)
BUN: 8 mg/dL (ref 8–23)
CO2: 30 mmol/L (ref 22–32)
Calcium: 8 mg/dL — ABNORMAL LOW (ref 8.9–10.3)
Chloride: 100 mmol/L (ref 98–111)
Creatinine, Ser: 0.68 mg/dL (ref 0.44–1.00)
GFR, Estimated: >60 mL/min (ref >60)
Glucose, Bld: 108 mg/dL — ABNORMAL HIGH (ref 70–99)
Potassium: 3.8 mmol/L (ref 3.5–5.1)
Sodium: 133 mmol/L — ABNORMAL LOW (ref 135–145)

## 2022-04-21 LAB — CBC
HCT: 26.6 % — ABNORMAL LOW (ref 36.0–46.0)
Hemoglobin: 8.5 g/dL — ABNORMAL LOW (ref 12.0–15.0)
MCH: 31.7 pg (ref 26.0–34.0)
MCHC: 32 g/dL (ref 30.0–36.0)
MCV: 99.3 fL (ref 80.0–100.0)
Platelets: 348 10*3/uL (ref 150–400)
RBC: 2.68 MIL/uL — ABNORMAL LOW (ref 3.87–5.11)
RDW: 15.1 % (ref 11.5–15.5)
WBC: 7.1 10*3/uL (ref 4.0–10.5)
nRBC: 0 % (ref 0.0–0.2)

## 2022-04-21 LAB — PROTIME-INR
INR: 1 (ref 0.8–1.2)
Prothrombin Time: 13.2 seconds (ref 11.4–15.2)

## 2022-04-21 LAB — HEMOGLOBIN AND HEMATOCRIT, BLOOD
HCT: 24.7 % — ABNORMAL LOW (ref 36.0–46.0)
Hemoglobin: 7.7 g/dL — ABNORMAL LOW (ref 12.0–15.0)

## 2022-04-21 MED ORDER — ACETAMINOPHEN 500 MG PO TABS
1000.0000 mg | ORAL_TABLET | Freq: Once | ORAL | Status: AC
  Administered 2022-04-21: 1000 mg via ORAL
  Filled 2022-04-21: qty 2

## 2022-04-21 MED ORDER — IOHEXOL 350 MG/ML SOLN
100.0000 mL | Freq: Once | INTRAVENOUS | Status: AC | PRN
  Administered 2022-04-21: 100 mL via INTRAVENOUS

## 2022-04-21 MED ORDER — IOHEXOL 300 MG/ML  SOLN
75.0000 mL | Freq: Once | INTRAMUSCULAR | Status: AC | PRN
  Administered 2022-04-21: 75 mL via INTRAVENOUS
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
## 2022-04-21 NOTE — ED Provider Notes (Signed)
6:26 PM Assumed care for off going team.   Blood pressure (!) 103/55, pulse 83, temperature 98.4 F (36.9 C), temperature source Oral, resp. rate 15, height 5\' 7"  (1.702 m), weight 73 kg, SpO2 93 %.  See their HPI for full report but in brief pending CTA   IMPRESSION: 1. Redemonstrated left pectoralis major hematoma without discrete area of contrast extravasation to suggest active hemorrhage. 2. No evidence of vascular injury affecting the left upper extremity. 3. No acute fracture or dislocation. 4. Post dynamic screw fixation of the left femoral neck intramedullary rod fixation of the left femur with an approximately 6.2 cm fluid collection about the operative site, nonspecific though presumably a postoperative seroma. Clinical correlation is advised.      6:27 PM reevaluated patient.  Compartments are soft.  No significant worsening of swelling but hemoglobin did drop from 8.5-7.7 offered patient admission for trending hemoglobins due to concern for dropping hemoglobin even though there is no active extravasation I think that there is a chance that she may need blood transfusion later tonight or tomorrow morning if it drops below 7  I discussed with patient the incidental finding of the fluid collection but she has no pain in this area no fevers able to ambulate on it and denies any concerns again this can be followed up outpatient with Ortho  Pt does not want to stay in hospital.  She is requesting discharge and would prefer to have a recheck with her facility.  7:05 PM I discussed with Dewayne Shorter from her doctor who is at the facility.  They stated that they would be able to recheck hemoglobin tomorrow.  I discussed with the patient and Dr. Unk Lightning to feel there is a good chance that if it does drop below 7 that she would need transfusion they expressed understanding but at this time they continued to decline admission and would prefer to come back if symptoms are worsening or if  the hemoglobin drops on repeat tomorrow morning   Vanessa [place], MD 04/21/22 [phone]

## 2022-04-21 NOTE — ED Notes (Signed)
Report given to Bland Span at [facility], aware of need for patient to have Hgb rechecked tomorrow. Discussed discharge planning discussed with patient and family as well including red flag reasons to return to the ER.  EMS to transport patient back to facility.

## 2022-04-21 NOTE — Discharge Instructions (Addendum)
You are seen in the emergency department for a large hematoma to your left arm and breast.  Your x-ray of your shoulder and arm did not show any fracture or dislocation.  You had a significant drop of your hemoglobin level.  It is important that you recheck this with your primary care physician.  Return to the emergency department for any worsening symptoms.  Your hemoglobin is to be rechecked tomorrow and if it drops below or 7 please return for blood transfusion.  We offered you admission to trend overnight time but you have declined so if you develop worsening symptoms please return or if the hemoglobin is below 7
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
## 2022-04-21 NOTE — Progress Notes (Addendum)
Location:  [facility].  Nursing Home Room Number: [place] Place of Service:  SNF [phone]) Provider:  Dewayne Shorter, MD  Patient Care Team: Dewayne Shorter, MD as PCP - General (Family Medicine)  Extended Emergency Contact Information Primary Emergency Contact: [name],[name] Mobile Phone: [phone] Relation: Daughter Secondary Emergency Contact: Kristalyn, [place] Address: [address] [place]          [place], [place] [address] Johnnette Litter of [place] Phone: [phone] Mobile Phone: [phone] Relation: Spouse  Code Status:  DNR (scanned in chart) Full code in [facility] Goals of care: Advanced Directive information    04/21/2022    1:49 PM  Advanced Directives  Does Patient Have a Medical Advance Directive? Yes  Type of Paramedic of [place];Out of facility DNR (pink MOST or yellow form);Living will  Does patient want to make changes to medical advance directive? No - Patient declined  Copy of [place] in Chart? Yes - validated most recent copy scanned in chart (See row information)     Chief Complaint  Patient presents with   Acute Visit    Shoulder Injury    HPI:  Pt is a 88 y.o. female seen today for an acute visit for continued shoulder pain. Patient has had expansion of bruising to the opposite breast. Shoulder pain has been unbearable. She is open to going to emergency department today for an evaluation as per our previous discussion.    Past Medical History:  Diagnosis Date   Acute blood loss anemia    Arthritis    Cancer ([place])    skin   Central cord syndrome at C6 level of cervical spinal cord ([place]) 11/29/2017   Hypertension    Protein-calorie malnutrition, severe ([place]) 01/24/2018   S/P insertion of IVC (inferior vena caval) filter 01/24/2018   Tetraparesis ([place])    Past Surgical History:  Procedure Laterality Date   ANTERIOR CERVICAL DECOMP/DISCECTOMY FUSION N/A 11/29/2017   Procedure: Cervical  five-six, six-seven Anterior Cervical Decompression Fusion;  Surgeon: Judith Part, MD;  Location: [facility];  Service: Neurosurgery;  Laterality: N/A;  Cervical five-six, six-seven Anterior Cervical Decompression Fusion   CATARACT EXTRACTION     FEMUR IM NAIL Left 08/16/2021   Procedure: INTRAMEDULLARY (IM) NAIL FEMORAL;  Surgeon: Earnestine Leys, MD;  Location: [facility] ORS;  Service: Orthopedics;  Laterality: Left;   IR IVC FILTER PLMT / S&I /IMG GUID/MOD SED  12/08/2017    Allergies  Allergen Reactions   Other Swelling   Lisinopril-Hydrochlorothiazide Swelling    Swelling of the tongue   Beef-Derived Products Swelling    Facial and lip swelling (due to tick bite)   Dairy Aid [Tilactase] Swelling   Lisinopril-Hydrochlorothiazide Swelling    Tongue swelling   Pork-Derived Products Swelling    Outpatient Encounter Medications as of 04/21/2022  Medication Sig   acetaminophen (TYLENOL) 500 MG tablet Take 500 mg by mouth every 12 (twelve) hours as needed for mild pain.   acetaminophen (TYLENOL) 650 MG CR tablet Take 650 mg by mouth every 8 (eight) hours as needed for pain.   acetic acid 0.25 % irrigation Use 67ml via irrigation twice daily to keep foley Cath patent.   alendronate (FOSAMAX) 70 MG tablet Take 70 mg by mouth every Saturday.   Capsaicin 0.1 % CREA Apply 1 application  topically every evening. (Apply to hands)   carboxymethylcellulose (REFRESH PLUS) 0.5 % SOLN Place 2 drops into both eyes every 6 (six) hours as needed (dry  eyes).   cetirizine (ZYRTEC) 5 MG tablet Take 5 mg by mouth daily.   cholecalciferol (VITAMIN D3) 25 MCG (1000 UNIT) tablet Take 1,000 Units by mouth daily.   DULoxetine (CYMBALTA) 60 MG capsule Take 60 mg by mouth daily.   EPINEPHrine 0.3 mg/0.3 mL IJ SOAJ injection Inject 0.3 mg into the muscle as needed for anaphylaxis.   ferrous sulfate 325 (65 FE) MG tablet Take 325 mg by mouth 2 (two) times daily with a meal.   gabapentin (NEURONTIN) 100 MG capsule Take  1 capsule (100 mg total) by mouth daily.   guaiFENesin (MUCINEX) 600 MG 12 hr tablet Take 600 mg by mouth 2 (two) times daily as needed for to loosen phlegm.   Infant Care Products [facility]) OINT Apply to buttocks/gluteal folds topically every shift.   lactulose, encephalopathy, (CHRONULAC) 10 GM/15ML SOLN Take 30 mLs by mouth daily as needed (mild constipation).   magnesium citrate solution Take 60-120 mLs by mouth daily as needed (constipation).   methylcellulose oral powder Take 1 packet by mouth at bedtime.   mometasone (ELOCON) 0.1 % lotion Apply 1 Application topically every Saturday at 6 PM. (Apply to the ear canal for eczema)   Multiple Vitamin (MULTIVITAMIN WITH MINERALS) TABS tablet Take 1 tablet by mouth daily.   polyethylene glycol (MIRALAX / GLYCOLAX) 17 g packet Take 17 g by mouth 2 (two) times daily.   saccharomyces boulardii (FLORASTOR) 250 MG capsule Take 250 mg by mouth daily.   Saline Spray 0.2 % SOLN Place 2 sprays into both nostrils as needed (nasal congestion).   simethicone (MYLICON) 80 MG chewable tablet Chew 80 mg by mouth every 6 (six) hours as needed for flatulence.   sodium phosphate (FLEET) 7-19 GM/118ML ENEM Place 1 enema rectally every other day as needed for severe constipation.   spironolactone (ALDACTONE) 25 MG tablet Take 1 tablet (25 mg total) by mouth daily.   tiZANidine (ZANAFLEX) 2 MG tablet Take 1 tablet (2 mg total) by mouth 2 (two) times daily.   triamcinolone cream (KENALOG) 0.1 % Apply to thigh and lower legs topically every 8 hours as needed. Apply to back of neck topically as needed.   rivaroxaban (XARELTO) 20 MG TABS tablet Take 1 tablet (20 mg total) by mouth daily with supper. (Patient not taking: Reported on 04/21/2022)   [DISCONTINUED] calcium carbonate (OSCAL) 1500 (600 Ca) MG TABS tablet Take 1 tablet by mouth daily with breakfast.   [DISCONTINUED] traMADol (ULTRAM) 50 MG tablet Take 50 mg by mouth every 12 (twelve) hours as needed.   No  facility-administered encounter medications on file as of 04/21/2022.    Review of Systems  Immunization History  Administered Date(s) Administered   Influenza, High Dose Seasonal PF 11/17/2021   Influenza-Unspecified 11/14/2018, 11/20/2019, 11/17/2020   Moderna Covid-19 Vaccine Bivalent Booster 52yrs & up 06/30/2021, 12/11/2021   Moderna Sars-Covid-2 Vaccination 02/09/2019, 03/09/2019, 12/14/2019, 06/20/2020, 10/24/2020   PPD Test 05/16/2019   Tdap 11/29/2017   Pertinent  Health Maintenance Due  Topic Date Due   INFLUENZA VACCINE  Completed   DEXA SCAN  Discontinued      08/18/2021    8:00 PM 08/19/2021   10:00 AM 08/19/2021    8:54 PM 08/20/2021    8:20 AM 03/29/2022   10:11 AM  Fall Risk  Falls in the past year?     0  Was there an injury with Fall?     0  Fall Risk Category Calculator     0  (  RETIRED) Patient Fall Risk Level High fall risk High fall risk High fall risk High fall risk   Patient at Risk for Falls Due to     No Fall Risks   Functional Status Survey:    Vitals:   04/21/22 1339  BP: 120/68  Pulse: 76  Resp: 18  Temp: (!) 97.5 F (36.4 C)  SpO2: 97%  Weight: 174 lb 12.8 oz (79.3 kg)  Height: 5\' 7"  (1.702 m)   Body mass index is 27.38 kg/m. Physical Exam Constitutional:      Comments: Quadriplegia in bed.   Skin:    Comments: Bruising extends beyond previous lines. Now has hematoma on right breast.   Neurological:     Mental Status: She is alert.     Labs reviewed: Recent Labs    08/19/21 0333 08/20/21 0443 09/24/21 0000 02/08/22 0000 03/08/22 0000 04/21/22 1200  NA 130* 130*   < > 137 133* 133*  K 5.1 4.5   < > 4.8 4.0 3.8  CL 102 101   < > 98* 97* 100  CO2 23 26   < > 33* 28* 30  GLUCOSE 117* 101*  --   --   --  108*  BUN 23 15   < > 10 11 8   CREATININE 0.72 0.54   < > 0.5 0.6 0.68  CALCIUM 7.4* 7.1*   < > 9.0 8.0* 8.0*   < > = values in this interval not displayed.   Recent Labs    09/24/21 0000 02/08/22 0000  AST  --  18   ALT  --  14  ALKPHOS  --  65  ALBUMIN 3.2* 3.7   Recent Labs    08/19/21 0333 08/20/21 0443 08/20/21 1029 02/08/22 0000 02/25/22 0000 03/08/22 0000 04/21/22 1200  WBC 10.6* 8.9   < > 5.5 5.1 6.2 7.1  NEUTROABS  --   --   --  3,036.00 2,800.00 3,683.00  --   HGB 8.9* 7.8*   < > 11.7* 10.4* 11.9* 8.5*  HCT 27.5* 23.7*   < > 35* 32* 35* 26.6*  MCV 88.4 87.1  --   --   --   --  99.3  PLT 198 216   < > 321 318 360 348   < > = values in this interval not displayed.   No results found for: "TSH" No results found for: "HGBA1C" No results found for: "CHOL", "HDL", "LDLCALC", "LDLDIRECT", "TRIG", "CHOLHDL"  Significant Diagnostic Results in last 30 days:  CT Chest W Contrast  Result Date: 04/21/2022 CLINICAL DATA:  Trauma EXAM: CT CHEST WITH CONTRAST TECHNIQUE: Multidetector CT imaging of the chest was performed during intravenous contrast administration. RADIATION DOSE REDUCTION: This exam was performed according to the departmental dose-optimization program which includes automated exposure control, adjustment of the mA and/or kV according to patient size and/or use of iterative reconstruction technique. CONTRAST:  75 mL OMNIPAQUE IOHEXOL 300 MG/ML  SOLN COMPARISON:  None Available. FINDINGS: Cardiovascular: Mild cardiomegaly. No pericardial effusion. Atheromatous calcifications coronary arteries. Mediastinum/Nodes: No enlarged mediastinal, hilar, or axillary lymph nodes. Thyroid gland, trachea, and esophagus demonstrate no significant findings. Lungs/Pleura: Bibasilar subsegmental atelectasis. Minimal left basilar consolidation and tiny left-sided pleural effusion. No pneumothorax. Upper Abdomen: Left kidney cysts measuring up to 2.8 cm. This does not need to be followed up. Otherwise no acute pathology. There is an IVC filter. Musculoskeletal: There are thoracic degenerative changes. IMPRESSION: 1. No evidence of PE, aneurysm or dissection. 2. Mild cardiomegaly.  3. Subsegmental  atelectasis/consolidation left base. 4. Small left-sided pleural effusion. 5. Left kidney cysts. Electronically Signed   By: Sammie Bench M.D.   On: 04/21/2022 13:49   DG Humerus Left  Result Date: 04/21/2022 CLINICAL DATA:  Left arm pain after injury several days ago. EXAM: LEFT HUMERUS - 2+ VIEW COMPARISON:  None Available. FINDINGS: There is no evidence of fracture or other focal bone lesions. Soft tissues are unremarkable. IMPRESSION: Negative. Electronically Signed   By: Marijo Conception M.D.   On: 04/21/2022 13:15    Assessment/Plan Hematoma and contusion  Injury of left shoulder, initial encounter Patient with expansion of bruising on left arm and more bruising on contralateral breast. Concerning for expanding hematoma and CBC still pending. MRI is 10 days away. Patient with pulses in arm, and no significant increase in size of the arm, however, persistent swelling and worsening bruising, will send to emergency department for imaging -- likely needs an MRI due to concern for muscle injury and negative x-rays in the facility.    Family/ staff Communication: Nursing  to update family   Labs/tests ordered:  CBC pending

## 2022-04-22 ENCOUNTER — Ambulatory Visit: Payer: Medicare HMO | Admitting: Occupational Therapy

## 2022-04-23 ENCOUNTER — Non-Acute Institutional Stay (SKILLED_NURSING_FACILITY): Payer: Medicare HMO | Admitting: Student

## 2022-04-23 ENCOUNTER — Encounter: Payer: Self-pay | Admitting: Student

## 2022-04-23 DIAGNOSIS — I110 Hypertensive heart disease with heart failure: Secondary | ICD-10-CM

## 2022-04-23 DIAGNOSIS — G8250 Quadriplegia, unspecified: Secondary | ICD-10-CM | POA: Diagnosis not present

## 2022-04-23 DIAGNOSIS — I5089 Other heart failure: Secondary | ICD-10-CM

## 2022-04-23 DIAGNOSIS — S40022D Contusion of left upper arm, subsequent encounter: Secondary | ICD-10-CM | POA: Diagnosis not present

## 2022-04-23 DIAGNOSIS — I824Y3 Acute embolism and thrombosis of unspecified deep veins of proximal lower extremity, bilateral: Secondary | ICD-10-CM

## 2022-04-23 DIAGNOSIS — I10 Essential (primary) hypertension: Secondary | ICD-10-CM

## 2022-04-23 NOTE — Progress Notes (Unsigned)
Location:  Other [place].  Nursing Home Room Number: [place] Place of Service:  SNF [phone]) Provider:  Dewayne Shorter, MD  Patient Care Team: Dewayne Shorter, MD as PCP - General (Family Medicine)  Extended Emergency Contact Information Primary Emergency Contact: [name],[name] Mobile Phone: [phone] Relation: Daughter Secondary Emergency Contact: Jacklynn, Barrand Address: [address] [place]          [place], [place] [address] Johnnette Litter of [place] Phone: [phone] Mobile Phone: [phone] Relation: Spouse  Code Status:  DNR in chart. Full code in [facility] Goals of care: Advanced Directive information    04/23/2022    1:43 PM  Advanced Directives  Does Patient Have a Medical Advance Directive? Yes  Type of Paramedic of [place];Living will;Out of facility DNR (pink MOST or yellow form)  Does patient want to make changes to medical advance directive? No - Patient declined  Copy of [place] in Chart? Yes - validated most recent copy scanned in chart (See row information)     Chief Complaint  Patient presents with   Acute Visit    ER Follow up    HPI:  Pt is a 88 y.o. female seen today for an acute visit for follow up after Emergency department evaluation for expanding hematoma of the left shoulder. Hgb borderline for transfusion and patient declined admission to hospital for monitoring hemoglobin.   Repeat hemoglobin is stable. Pain improved. Swelling improved as well.   Patient is much improved and bruising is much improved. Discussed additional imaging with MRI, however, patient states that she would not willingly go through another surgery, so declined another imaging study.     Past Medical History:  Diagnosis Date   Acute blood loss anemia    Arthritis    Cancer ([place])    skin   Central cord syndrome at C6 level of cervical spinal cord ([place]) 11/29/2017   Hypertension    Protein-calorie  malnutrition, severe ([place]) 01/24/2018   S/P insertion of IVC (inferior vena caval) filter 01/24/2018   Tetraparesis ([place])    Past Surgical History:  Procedure Laterality Date   ANTERIOR CERVICAL DECOMP/DISCECTOMY FUSION N/A 11/29/2017   Procedure: Cervical five-six, six-seven Anterior Cervical Decompression Fusion;  Surgeon: Judith Part, MD;  Location: [place];  Service: Neurosurgery;  Laterality: N/A;  Cervical five-six, six-seven Anterior Cervical Decompression Fusion   CATARACT EXTRACTION     FEMUR IM NAIL Left 08/16/2021   Procedure: INTRAMEDULLARY (IM) NAIL FEMORAL;  Surgeon: Earnestine Leys, MD;  Location: [facility];  Service: Orthopedics;  Laterality: Left;   IR IVC FILTER PLMT / S&I /IMG GUID/MOD SED  12/08/2017    Allergies  Allergen Reactions   Other Swelling   Lisinopril-Hydrochlorothiazide Swelling    Swelling of the tongue   Beef-Derived Products Swelling    Facial and lip swelling (due to tick bite)   Dairy Aid [Tilactase] Swelling   Lisinopril-Hydrochlorothiazide Swelling    Tongue swelling   Pork-Derived Products Swelling    Outpatient Encounter Medications as of 04/23/2022  Medication Sig   acetaminophen (TYLENOL) 500 MG tablet Take 500 mg by mouth every 12 (twelve) hours as needed for mild pain.   acetaminophen (TYLENOL) 650 MG CR tablet Take 650 mg by mouth every 8 (eight) hours as needed for pain.   acetic acid 0.25 % irrigation Use 96ml via irrigation twice daily to keep foley Cath patent.   alendronate (FOSAMAX) 70 MG tablet Take 70 mg by mouth every Saturday.  calcium carbonate (CALCIUM 600) 600 MG TABS tablet Take 600 mg by mouth at bedtime.   Capsaicin 0.1 % CREA Apply 1 application  topically every evening. (Apply to hands)   carboxymethylcellulose (REFRESH PLUS) 0.5 % SOLN Place 2 drops into both eyes every 6 (six) hours as needed (dry eyes).   cetirizine (ZYRTEC) 5 MG tablet Take 5 mg by mouth daily.   cholecalciferol (VITAMIN D3) 25 MCG (1000 UNIT)  tablet Take 1,000 Units by mouth daily.   DULoxetine (CYMBALTA) 60 MG capsule Take 60 mg by mouth daily.   EPINEPHrine 0.3 mg/0.3 mL IJ SOAJ injection Inject 0.3 mg into the muscle as needed for anaphylaxis.   ferrous sulfate 325 (65 FE) MG tablet Take 325 mg by mouth 2 (two) times daily with a meal.   gabapentin (NEURONTIN) 100 MG capsule Take 1 capsule (100 mg total) by mouth daily.   guaiFENesin (MUCINEX) 600 MG 12 hr tablet Take 600 mg by mouth 2 (two) times daily as needed for to loosen phlegm.   Infant Care Products [facility]) OINT Apply to buttocks/gluteal folds topically every shift.   lactulose, encephalopathy, (CHRONULAC) 10 GM/15ML SOLN Take 30 mLs by mouth daily as needed (mild constipation).   magnesium citrate solution Take 60-120 mLs by mouth daily as needed (constipation).   methylcellulose oral powder Take 1 packet by mouth at bedtime.   mometasone (ELOCON) 0.1 % lotion Apply 1 Application topically every Saturday at 6 PM. (Apply to the ear canal for eczema)   Multiple Vitamin (MULTIVITAMIN WITH MINERALS) TABS tablet Take 1 tablet by mouth daily.   polyethylene glycol (MIRALAX / GLYCOLAX) 17 g packet Take 17 g by mouth 2 (two) times daily.   saccharomyces boulardii (FLORASTOR) 250 MG capsule Take 250 mg by mouth daily.   Saline Spray 0.2 % SOLN Place 2 sprays into both nostrils as needed (nasal congestion).   simethicone (MYLICON) 80 MG chewable tablet Chew 80 mg by mouth every 6 (six) hours as needed for flatulence.   sodium phosphate (FLEET) 7-19 GM/118ML ENEM Place 1 enema rectally every other day as needed for severe constipation.   spironolactone (ALDACTONE) 25 MG tablet Take 1 tablet (25 mg total) by mouth daily.   tiZANidine (ZANAFLEX) 2 MG tablet Take 1 tablet (2 mg total) by mouth 2 (two) times daily.   triamcinolone cream (KENALOG) 0.1 % Apply to thigh and lower legs topically every 8 hours as needed. Apply to back of neck topically as needed.   rivaroxaban (XARELTO)  20 MG TABS tablet Take 1 tablet (20 mg total) by mouth daily with supper. (Patient not taking: Reported on 04/23/2022)   No facility-administered encounter medications on file as of 04/23/2022.    Review of Systems  Immunization History  Administered Date(s) Administered   Influenza, High Dose Seasonal PF 11/17/2021   Influenza-Unspecified 11/14/2018, 11/20/2019, 11/17/2020   Moderna Covid-19 Vaccine Bivalent Booster 92yrs & up 06/30/2021, 12/11/2021   Moderna Sars-Covid-2 Vaccination 02/09/2019, 03/09/2019, 12/14/2019, 06/20/2020, 10/24/2020   PPD Test 05/16/2019   Tdap 11/29/2017   Pertinent  Health Maintenance Due  Topic Date Due   INFLUENZA VACCINE  Completed   DEXA SCAN  Discontinued      08/18/2021    8:00 PM 08/19/2021   10:00 AM 08/19/2021    8:54 PM 08/20/2021    8:20 AM 03/29/2022   10:11 AM  Fall Risk  Falls in the past year?     0  Was there an injury with Fall?  0  Fall Risk Category Calculator     0  (RETIRED) Patient Fall Risk Level High fall risk High fall risk High fall risk High fall risk   Patient at Risk for Falls Due to     No Fall Risks   Functional Status Survey:    Vitals:   04/23/22 1337  BP: 120/68  Pulse: 76  Resp: 18  Temp: (!) 97.5 F (36.4 C)  SpO2: 97%  Weight: 174 lb 12.8 oz (79.3 kg)  Height: 5\' 7"  (1.702 m)   Body mass index is 27.38 kg/m. Physical Exam Cardiovascular:     Rate and Rhythm: Normal rate.     Pulses: Normal pulses.  Pulmonary:     Effort: Pulmonary effort is normal.     Breath sounds: Normal breath sounds.  Abdominal:     General: Abdomen is flat.     Palpations: Abdomen is soft.  Skin:    Comments: Left arm with dependent bruising and brusiing of the left breast tissue.   Neurological:     Mental Status: She is alert and oriented to person, place, and time. Mental status is at baseline.     Labs reviewed: Recent Labs    08/19/21 0333 08/20/21 0443 09/24/21 0000 02/08/22 0000 03/08/22 0000  04/21/22 1200  NA 130* 130*   < > 137 133* 133*  K 5.1 4.5   < > 4.8 4.0 3.8  CL 102 101   < > 98* 97* 100  CO2 23 26   < > 33* 28* 30  GLUCOSE 117* 101*  --   --   --  108*  BUN 23 15   < > 10 11 8   CREATININE 0.72 0.54   < > 0.5 0.6 0.68  CALCIUM 7.4* 7.1*   < > 9.0 8.0* 8.0*   < > = values in this interval not displayed.   Recent Labs    09/24/21 0000 02/08/22 0000  AST  --  18  ALT  --  14  ALKPHOS  --  65  ALBUMIN 3.2* 3.7   Recent Labs    08/19/21 0333 08/20/21 0443 08/20/21 1029 02/08/22 0000 02/25/22 0000 03/08/22 0000 04/21/22 1200 04/21/22 1731  WBC 10.6* 8.9   < > 5.5 5.1 6.2 7.1  --   NEUTROABS  --   --   --  3,036.00 2,800.00 3,683.00  --   --   HGB 8.9* 7.8*   < > 11.7* 10.4* 11.9* 8.5* 7.7*  HCT 27.5* 23.7*   < > 35* 32* 35* 26.6* 24.7*  MCV 88.4 87.1  --   --   --   --  99.3  --   PLT 198 216   < > 321 318 360 348  --    < > = values in this interval not displayed.   No results found for: "TSH" No results found for: "HGBA1C" No results found for: "CHOL", "HDL", "LDLCALC", "LDLDIRECT", "TRIG", "CHOLHDL"  Significant Diagnostic Results in last 30 days:  CT ANGIO UP EXTREM LEFT W &/OR WO CONTAST  Result Date: 04/21/2022 CLINICAL DATA:  Shoulder trauma.  Concern for vascular injury. EXAM: CT ANGIOGRAPHY OF THE LEFT UPPER EXTREMITY TECHNIQUE: Multidetector CT imaging of the left upper was performed using the standard protocol during bolus administration of intravenous contrast. Multiplanar CT image reconstructions and MIPs were obtained to evaluate the vascular anatomy. RADIATION DOSE REDUCTION: This exam was performed according to the departmental dose-optimization program which includes automated exposure control,  adjustment of the mA and/or kV according to patient size and/or use of iterative reconstruction technique. CONTRAST:  130mL OMNIPAQUE IOHEXOL 350 MG/ML SOLN COMPARISON:  Chest CT-earlier same; left humerus radiographs-earlier same day CT abdomen  and pelvis - 01/21/2019 FINDINGS: VASCULAR FINDINGS: Left axillary artery: Widely patent without a hemodynamically significant narrowing. No vessel irregularity or dissection. Left brachial artery: Widely patent without a hemodynamically significant narrowing. No vessel irregularity or dissection. Left forearm vasculature: Widely patent to the level of the carpal row. The interosseous artery expectedly tapers at the level of the distal radioulnar joint. No discrete intraluminal filling defects to suggest distal embolism. Redemonstrated left pectoralis major hematoma, again without discrete area of contrast extravasation to suggest active arterial hemorrhage. _________________________________________________________ NON-VASCULAR FINDINGS: No fracture. Mild degenerative change of the left glenohumeral joint with joint space loss, subchondral sclerosis and osteophytosis. Post dynamic screw fixation of the left femoral neck and intramedullary rod fixation of the left femur with fluid collection about the operative site measuring approximately 6.2 x 4.0 cm, nonspecific though presumably a postoperative seroma. Suspected degenerative change of the hand and wrist, suboptimally evaluated due to technique and patient motion. Subcutaneous edema noted about the forearm, wrist and hand. Review of the MIP images confirms the above findings. IMPRESSION: 1. Redemonstrated left pectoralis major hematoma without discrete area of contrast extravasation to suggest active hemorrhage. 2. No evidence of vascular injury affecting the left upper extremity. 3. No acute fracture or dislocation. 4. Post dynamic screw fixation of the left femoral neck intramedullary rod fixation of the left femur with an approximately 6.2 cm fluid collection about the operative site, nonspecific though presumably a postoperative seroma. Clinical correlation is advised. Electronically Signed   By: Sandi Mariscal M.D.   On: 04/21/2022 17:00   CT Chest W  Contrast  Addendum Date: 04/21/2022   ADDENDUM REPORT: 04/21/2022 15:32 ADDENDUM: There is a 10 x 10 x 5 cm dense fluid collection in the left anterior chest and infraclavicular region consistent with a hematoma. No extravasated IV contrast is seen that would indicate the presence of active bleeding during contrast administration. Electronically Signed   By: Sammie Bench M.D.   On: 04/21/2022 15:32   Result Date: 04/21/2022 CLINICAL DATA:  Trauma EXAM: CT CHEST WITH CONTRAST TECHNIQUE: Multidetector CT imaging of the chest was performed during intravenous contrast administration. RADIATION DOSE REDUCTION: This exam was performed according to the departmental dose-optimization program which includes automated exposure control, adjustment of the mA and/or kV according to patient size and/or use of iterative reconstruction technique. CONTRAST:  75 mL OMNIPAQUE IOHEXOL 300 MG/ML  SOLN COMPARISON:  None Available. FINDINGS: Cardiovascular: Mild cardiomegaly. No pericardial effusion. Atheromatous calcifications coronary arteries. Mediastinum/Nodes: No enlarged mediastinal, hilar, or axillary lymph nodes. Thyroid gland, trachea, and esophagus demonstrate no significant findings. Lungs/Pleura: Bibasilar subsegmental atelectasis. Minimal left basilar consolidation and tiny left-sided pleural effusion. No pneumothorax. Upper Abdomen: Left kidney cysts measuring up to 2.8 cm. This does not need to be followed up. Otherwise no acute pathology. There is an IVC filter. Musculoskeletal: There are thoracic degenerative changes. IMPRESSION: 1. No evidence of PE, aneurysm or dissection. 2. Mild cardiomegaly. 3. Subsegmental atelectasis/consolidation left base. 4. Small left-sided pleural effusion. 5. Left kidney cysts. Electronically Signed: By: Sammie Bench M.D. On: 04/21/2022 13:49   DG Humerus Left  Result Date: 04/21/2022 CLINICAL DATA:  Left arm pain after injury several days ago. EXAM: LEFT HUMERUS - 2+ VIEW  COMPARISON:  None Available. FINDINGS: There is no evidence of  fracture or other focal bone lesions. Soft tissues are unremarkable. IMPRESSION: Negative. Electronically Signed   By: Marijo Conception M.D.   On: 04/21/2022 13:15    Assessment/Plan Traumatic hematoma of left upper arm, subsequent encounter  Deep vein thrombosis (DVT) of proximal vein of both lower extremities, unspecified chronicity ([place])  Hypertensive heart disease with other congestive heart failure (HCC)  Quadriplegia (HCC)  Benign essential HTN Hematoma improving and no signs of continued bleeding on CT scan. Plan to resume anticoagulation (Xarelto). Continue tramadol, gabapentin, and cymbalta for pain control. Continue tizanidine for baseline spasms. BP well-controlled and within normal range with spironolactone. Discussed the possibility of imaging arm for specific muscle injury, however, patient declined due to no desire for additional surgical interventions. Plan to continue physical therapy to maintain ROM.    Family/ staff Communication: nursing, daughter  Labs/tests ordered:  Weekly CBCs to monitor hemoglobin

## 2022-04-26 ENCOUNTER — Ambulatory Visit: Payer: Medicare HMO

## 2022-04-26 ENCOUNTER — Ambulatory Visit: Payer: Medicare HMO | Admitting: Occupational Therapy

## 2022-04-26 DIAGNOSIS — M6281 Muscle weakness (generalized): Secondary | ICD-10-CM

## 2022-04-26 DIAGNOSIS — R262 Difficulty in walking, not elsewhere classified: Secondary | ICD-10-CM

## 2022-04-26 DIAGNOSIS — R269 Unspecified abnormalities of gait and mobility: Secondary | ICD-10-CM

## 2022-04-26 DIAGNOSIS — R2681 Unsteadiness on feet: Secondary | ICD-10-CM

## 2022-04-26 DIAGNOSIS — R278 Other lack of coordination: Secondary | ICD-10-CM

## 2022-04-26 DIAGNOSIS — R2689 Other abnormalities of gait and mobility: Secondary | ICD-10-CM

## 2022-04-26 DIAGNOSIS — S14129S Central cord syndrome at unspecified level of cervical spinal cord, sequela: Secondary | ICD-10-CM

## 2022-04-26 DIAGNOSIS — S72001A Fracture of unspecified part of neck of right femur, initial encounter for closed fracture: Secondary | ICD-10-CM

## 2022-04-26 LAB — CBC AND DIFFERENTIAL
HCT: 26 — AB (ref 36–46)
Hemoglobin: 8.3 — AB (ref 12.0–16.0)
Neutrophils Absolute: 3283
Platelets: 444 10*3/uL — AB (ref 150–400)
WBC: 5.7

## 2022-04-26 LAB — CBC: RBC: 2.66 — AB (ref 3.87–5.11)

## 2022-04-26 NOTE — Therapy (Signed)
OUTPATIENT PHYSICAL THERAPY NEURO TREATMENT  Patient Name: Sherry [name] MRN: [id number] DOB:09/17/1934, 88 y.o., female Today's Date: 04/27/2022   PCP: Dewayne Shorter, MD REFERRING PROVIDER: Earnestine Leys   PT End of Session - 04/26/22 1121     Visit Number 37    Number of Visits 58    Date for PT Re-Evaluation 06/30/22    Authorization Time Period Initial cert [id number]; Recert [id number]; Recert [id number]    Progress Note Due on Visit 70    PT Start Time [id number]    PT Stop Time 1227    PT Time Calculation (min) 42 min    Equipment Utilized During Treatment Gait belt    Activity Tolerance Patient tolerated treatment well    Behavior During Therapy WFL for tasks assessed/performed                           Past Medical History:  Diagnosis Date   Acute blood loss anemia    Arthritis    Cancer ([place])    skin   Central cord syndrome at C6 level of cervical spinal cord ([place]) 11/29/2017   Hypertension    Protein-calorie malnutrition, severe ([place]) 01/24/2018   S/P insertion of IVC (inferior vena caval) filter 01/24/2018   Tetraparesis ([place])    Past Surgical History:  Procedure Laterality Date   ANTERIOR CERVICAL DECOMP/DISCECTOMY FUSION N/A 11/29/2017   Procedure: Cervical five-six, six-seven Anterior Cervical Decompression Fusion;  Surgeon: Judith Part, MD;  Location: [facility];  Service: Neurosurgery;  Laterality: N/A;  Cervical five-six, six-seven Anterior Cervical Decompression Fusion   CATARACT EXTRACTION     FEMUR IM NAIL Left 08/16/2021   Procedure: INTRAMEDULLARY (IM) NAIL FEMORAL;  Surgeon: Earnestine Leys, MD;  Location: [facility];  Service: Orthopedics;  Laterality: Left;   IR IVC FILTER PLMT / S&I /IMG GUID/MOD SED  12/08/2017   Patient Active Problem List   Diagnosis Date Noted   Hypertensive heart disease with other congestive heart failure ([place]) 02/14/2022   Chronic indwelling Foley catheter 02/14/2022    Closed hip fracture ([place]) 08/15/2021   DVT (deep venous thrombosis) ([place]) 08/15/2021   Quadriplegia ([place]) 08/15/2021   Fall 08/15/2021   Constipation due to slow transit 08/31/2018   Trauma 06/05/2018   Neuropathic pain 06/05/2018   Neurogenic bowel 06/05/2018   Vaginal yeast infection 01/30/2018   Healthcare-associated pneumonia 01/25/2018   Chronic allergic rhinitis 01/24/2018   Depression with anxiety 01/24/2018   UTI due to Klebsiella species 01/24/2018   Protein-calorie malnutrition, severe ([place]) 01/24/2018   S/P insertion of IVC (inferior vena caval) filter 01/24/2018   Tetraparesis ([place]) 01/20/2018   Neurogenic bladder 01/20/2018   Reactive depression    Benign essential HTN    Acute postoperative anemia due to expected blood loss    Central cord syndrome at C6 level of cervical spinal cord ([place]) 11/29/2017   Allergy to alpha-gal 11/25/2016   SCC (squamous cell carcinoma) 04/08/2014    ONSET DATE: 08/15/2021 (fall with B hip fx); Initial injury was in 2019- quadraplegia due to central cord syndrome of C6 secondary to neck fracture.   REFERRING DIAG: Bilateral Hip fx; Left Open- s/p Left IM nail femoral on 08/16/2021; Right closed  THERAPY DIAG:  Muscle weakness (generalized)  Other lack of coordination  Abnormality of gait and mobility  Difficulty in walking, not elsewhere classified  Other abnormalities of gait and mobility  Closed fracture of both hips, initial encounter (HCC)  Unsteadiness on  feet  Central cord syndrome, sequela ([place])  Rationale for Evaluation and Treatment Rehabilitation  SUBJECTIVE:   SUBJECTIVE STATEMENT:    Patient reports she injured her left arm last week- No fracture but arm and left sided chest very bruised.    Pt accompanied by:  Paid caregiver-   PERTINENT HISTORY: Patient was previous patient (see previous episode for specifics) She experienced a fall at home on 08/15/2021 and was in [facility] from 08/15/2021-  08/20/2021.  Most recent history per Dr. Laurey Arrow on 08/20/2021.  Sherry [name] is a 88 y.o. 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   Patient has history of quadriplegia, and has been doing PT/OT with improvement of function. Pt has some use of both arms and can feed herself. She can walk a little bit with assistance. Pt accidentally fell at about 930 this morning after taking shower.  She injured her hips. Hospital Course:    Closed left hip fracture [facility]) s/p mechanical fall Closed right hip fracture (?old) Acute blood loss anemia --pt is s/p  INTRAMEDULLARY (IM) NAIL FEMORAL-left on 7/16  PAIN:  Are you having pain? NO: NPRS scale: 0/10 Pain location: Right hip Pain description: Ache Aggravating factors: any active Movement of right side Relieving factors: Rest  PRECAUTIONS: Fall  WEIGHT BEARING RESTRICTIONS Yes Bilateral non-weight bearing B LE's  FALLS: Has patient fallen in last 6 months? Yes. Number of falls 1  LIVING ENVIRONMENT: Lives with: lives with their family and lives in a nursing home Lives in: Other Long term care at [place]: No Has following equipment at home: Wheelchair (power), Ramped entry, and hoyer lift  PLOF: Needs assistance with ADLs, Needs assistance with homemaking, Needs assistance with gait, Needs assistance with transfers, and Leisure: Work puzzles, read, Watch TV  PATIENT GOALS Patient goal is to walk; Be able to strengthen my arms for better use  OBJECTIVE:   DIAGNOSTIC FINDINGS: Overall cognitive statusEXAM: DG HIP (WITH OR WITHOUT PELVIS) 2-3V LEFT; DG HIP (WITH OR WITHOUT PELVIS) 2-3V RIGHT   COMPARISON:  CT of the abdomen and pelvis 01/21/2020   FINDINGS: Left proximal femur fracture noted just  below the intertrochanteric region. Marked ventral angulation noted on the cross-table image. Slight varus angulation present   Comminuted intratrochanteric fracture present on the right also with marked ventral angulation.   IMPRESSION: 1. Bilateral hip fractures. 2. Comminuted intertrochanteric fracture of the right hip. 3. Angulated fracture of the proximal left femur just below the intertrochanteric region.     Electronically Signed   By: San Morelle M.D.   On: 08/15/2021 11:54:   COGNITION:  Within functional limits for tasks assessed   SENSATION: Light touch: Impaired   EDEMA:  None  Strength R/L 2-/2- Shoulder flexion (anterior deltoid/pec major/coracobrachialis, axillary n. (C5-6) and musculocutaneous n. (C5-7)) 2-/3+ (within available ROM around 130 on lef)Shoulder abduction (deltoid/supraspinatus, axillary/suprascapular n, C5) 3+/3+ Shoulder external rotation (infraspinatus/teres minor) 4/4 Shoulder internal rotation (subcapularis/lats/pec major) 3+/3+ Shoulder extension (posterior deltoid, lats, teres major, axillary/thoracodorsal n.) 3+/3+ Shoulder horizontal abduction 4/4 Elbow flexion (biceps brachii, brachialis, brachioradialis, musculoskeletal n, C5-6) 4/4 Elbow extension (triceps, radial n, C7) 2-/2- Wrist Extension 3+/3+ Wrist Flexion   LOWER EXTREMITY MMT:    MMT Right 01/11/22 Left 01/11/22  Hip flexion 2- 2-  Hip extension 2-  2-  Hip abduction 2- 2-  Hip adduction 2- 3  Hip internal rotation 2- 2-  Hip external rotation 2- 2-  Knee flexion 2- 2-  Knee extension 2+ 3+  Ankle dorsiflexion 2- NT - boot  Ankle plantarflexion 2- NT- boot  Ankle inversion 2- NT- boot  Ankle eversion 2- NT- boot  (Blank rows = not tested)  BED MOBILITY:  Unable to test due to NWB status of BLE- Patient currently dependent with all bed mobility using hoyer and caregiver assist  TRANSFERS: Currently BLE NWB- Dependent on use of hoyer for all  transfers  FUNCTIONAL TESTs:   PATIENT SURVEYS:  FOTO 12  TODAY'S TREATMENT:  *Deferred standing or any trunk/UE due to left arm injury   THERE.EX: Passive Stretching to each LE (ankle,knee flex/ext) x several min AAROM- B hip flex 2 sets x 12 reps AROM- LAQ- each LE 2 sets of 15 reps (Right up as high as possible)  B hip ER/IR with leg straight 2 sets of 15 reps B hip abd/add with heel on stool to perform AAROM: x20/LE   Manual resistive Leg press 2 sets of 10 reps    B ham curl (AROM) - 2 sets of 15 reps  Pt educated throughout session about proper posture and technique with exercises. Improved exercise technique, movement at target joints, use of target muscles after min to mod verbal, visual, tactile cues.   PATIENT EDUCATION: Education details: PT plan of care/use of resistive bands Person educated: Patient Education method: Explanation, Demonstration, Tactile cues, and Verbal cues Education comprehension: verbalized understanding, returned demonstration, verbal cues required, tactile cues required, and needs further education   HOME EXERCISE PROGRAM: No changes at this time  Access Code: [facility] URL: [email or web address] Date: 11/23/2021 Prepared by: Janna Arch Exercises - Seated Gluteal Sets - 2 x daily - 7 x weekly - 2 sets - 10 reps - 5 hold - Seated Quad Set - 2 x daily - 7 x weekly - 2 sets - 10 reps - 5 hold - Seated Long Arc Quad - 2 x daily - 7 x weekly - 2 sets - 10 reps - 5 hold - Seated March - 2 x daily - 7 x weekly - 2 sets - 10 reps - 5 hold - Seated Hip Abduction - 2 x daily - 7 x weekly - 2 sets - 10 reps - 5 hold - Seated Shoulder Shrugs - 2 x daily - 7 x weekly - 2 sets - 10 reps - 5 hold - Wheelchair Pressure Relief - 2 x daily - 7 x weekly - 2 sets - 10 reps - 5 hold  GOALS: Goals reviewed with patient? Yes  SHORT TERM GOALS: Target date: 02/22/2022  Pt will be independent with initial UE strengthening HEP in order to improve strength  and balance in order to decrease fall risk and improve function at home and work. Baseline: 10/19/2021- patient with no formal UE HEP; 12/02/2021= Patient verbalized knowledge of HEP including use of theraband for UE strengthening.  Goal status: GOAL MET  LONG TERM GOALS: Target date: 06/30/2022  Pt will be independent with final for UE/LE HEP in order to improve strength and balance in order to decrease fall risk and improve function at home and work. Baseline: Patient is currently BLE NWB and unable to participate in HEP. Has order for UE strengthening. 01/11/2022- Patient now able to participate in LE strengthening although NWB still- good understanding for some basic exercises. Will keep goal active  to incorporate progressive LE strengthening exercises. 04/07/2022= Patient still participating in progressive LE seated HEP and has no questions at this time.  Goal status: MET  2.  Pt will improve FOTO to target score of 35  to display perceived improvements in ability to complete ADL's.  Baseline: 10/19/2021= 12; 12/02/2021= 17; 01/11/2022=17; 04/07/2022= 17 Goal status: Ongoing  3.  Pt will increase strength of B UE  by at least 1/2 MMT grade in order to demonstrate improvement in strength and function  Baseline: patient has range of 2-/5 to 4/5 BUE Strength; 12/02/2021= 4/5 except with wrist ext. 01/11/2022= 4/5 B UE strength expept for wrist Goal status: Partially met  4. Pt. Will increase strength of RLE by at least 1/2 MMT grade in order to demonstrate improvement in Standing/transfers.  Baseline: 2-/5 Right hip flex/knee ext/flex; 04/07/2022= 2- with right hip flex/knee ext (lacking 28 deg from zero)   Goal status: ONGOING- able to extend right knee more-closer to full ROM  5. Pt. Will demo ability to stand pivot transfer with max assist for improved functional mobility and less dependent need on mechanical device.   Baseline: Dependent on hoyer lift for all transfers; 04/07/2022- Unable to test  secondary to patient with recent UTI and right hand procedure - will attempt to reassess next visit. ; 04/12/22: Unable to successfully stand due to feet plates on loaned power w/c  Goal status: ONGOING  6. Pt. Will demonstrate improved functional LE strength as seen by ability to stand > 2 min for improved transfer ability and pregait abilities.   Baseline: not assessed today due to recent dx: UTI; 04/07/2022- Unable to test secondary to patient with recent UTI and right hand procedure - will attempt to reassess next visit; 04/12/22: Unable to successfully stand due to feet plates on loaned power w/c  Goal status: ONGOING  ASSESSMENT:  CLINICAL IMPRESSION:  Patient injured her left arm last week - so concentrated on LE strengthening- Increased tightness observed in left knee vs. Past visits yet improved right quad activity with LAQ and leg press today. She was motivated yet limited by fatigue while performing 2 sets of 15 reps. She continues to be compromised and unable to practice standing due to Left arm injury and still in rental chair. Pt will continue to benefit from skilled PT services to optimize independence and reduced caregiver support.    OBJECTIVE IMPAIRMENTS Abnormal gait, decreased activity tolerance, decreased balance, decreased coordination, decreased endurance, decreased mobility, difficulty walking, decreased ROM, decreased strength, hypomobility, impaired flexibility, impaired UE functional use, postural dysfunction, and pain.   ACTIVITY LIMITATIONS carrying, lifting, bending, sitting, standing, squatting, sleeping, stairs, transfers, bed mobility, continence, bathing, toileting, dressing, self feeding, reach over head, hygiene/grooming, and caring for others  PARTICIPATION LIMITATIONS: meal prep, cleaning, laundry, medication management, personal finances, interpersonal relationship, driving, shopping, community activity, and yard work  PERSONAL FACTORS Age, Time since onset of  injury/illness/exacerbation, and 1-2 comorbidities: HTN, cervical Sx  are also affecting patient's functional outcome.   REHAB POTENTIAL: Good  CLINICAL DECISION MAKING: Evolving/moderate complexity  EVALUATION COMPLEXITY: Moderate  PLAN: PT FREQUENCY: 1-2x/week  PT DURATION: 12 weeks  PLANNED INTERVENTIONS: Therapeutic exercises, Therapeutic activity, Neuromuscular re-education, Balance training, Gait training, Patient/Family education, Self Care, Joint mobilization, DME instructions, Dry Needling, Electrical stimulation, Wheelchair mobility training, Spinal mobilization, Cryotherapy, Moist heat, and Manual therapy  PLAN FOR NEXT SESSION: Continue with weight bearing activities and LE strengthening as appropriate.   Ollen Bowl, PT Physical Therapist- [place]  [facility]  04/27/2022, 10:37 AM

## 2022-04-26 NOTE — Therapy (Addendum)
OUTPATIENT PHYSICAL THERAPY WHEELCHAIR EVALUATION   Patient Name: Sherry [name] MRN: [id number] DOB:[birth year], 88 y.o., female Today's Date: 05/12/2022  END OF SESSION:    Past Medical History:  Diagnosis Date   Acute blood loss anemia    Arthritis    Cancer    skin   Central cord syndrome at C6 level of cervical spinal cord 11/29/2017   Hypertension    Protein-calorie malnutrition, severe 01/24/2018   S/P insertion of IVC (inferior vena caval) filter 01/24/2018   Tetraparesis    Past Surgical History:  Procedure Laterality Date   ANTERIOR CERVICAL DECOMP/DISCECTOMY FUSION N/A 11/29/2017   Procedure: Cervical five-six, six-seven Anterior Cervical Decompression Fusion;  Surgeon: Jadene Pierini, MD;  Location: [facility] OR;  Service: Neurosurgery;  Laterality: N/A;  Cervical five-six, six-seven Anterior Cervical Decompression Fusion   CATARACT EXTRACTION     FEMUR IM NAIL Left 08/16/2021   Procedure: INTRAMEDULLARY (IM) NAIL FEMORAL;  Surgeon: Deeann Saint, MD;  Location: [facility] ORS;  Service: Orthopedics;  Laterality: Left;   IR IVC FILTER PLMT / S&I /IMG GUID/MOD SED  12/08/2017   Patient Active Problem List   Diagnosis Date Noted   Hypertensive heart disease with other congestive heart failure 02/14/2022   Chronic indwelling Foley catheter 02/14/2022   Closed hip fracture 08/15/2021   DVT (deep venous thrombosis) 08/15/2021   Quadriplegia 08/15/2021   Fall 08/15/2021   Constipation due to slow transit 08/31/2018   Trauma 06/05/2018   Neuropathic pain 06/05/2018   Neurogenic bowel 06/05/2018   Vaginal yeast infection 01/30/2018   Healthcare-associated pneumonia 01/25/2018   Chronic allergic rhinitis 01/24/2018   Depression with anxiety 01/24/2018   UTI due to Klebsiella species 01/24/2018   Protein-calorie malnutrition, severe 01/24/2018   S/P insertion of IVC (inferior vena caval) filter 01/24/2018   Tetraparesis 01/20/2018   Neurogenic bladder 01/20/2018    Reactive depression    Benign essential HTN    Acute postoperative anemia due to expected blood loss    Central cord syndrome at C6 level of cervical spinal cord 11/29/2017   Allergy to alpha-gal 11/25/2016   SCC (squamous cell carcinoma) 04/08/2014    PCP: Dr. Earnestine Mealing  REFERRING PROVIDER: Dr. 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 Cord Syndrome  THERAPY DIAG:  Muscle weakness (generalized) - Plan: PT plan of care cert/re-cert  Other lack of coordination - Plan: PT plan of care cert/re-cert  Abnormality of gait and mobility - Plan: PT plan of care cert/re-cert  Difficulty in walking, not elsewhere classified - Plan: PT plan of care cert/re-cert  Other abnormalities of gait and mobility - Plan: PT plan of care cert/re-cert  Central cord syndrome, sequela (HCC) - Plan: PT plan of care cert/re-cert  Closed fracture of both hips, initial encounter (HCC) - Plan: PT plan of care cert/re-cert  Unsteadiness on feet - Plan: PT plan of care cert/re-cert  ONSET DATE: 11/29/2017  Rationale for Evaluation and Treatment: Rehabilitation  SUBJECTIVE:  SUBJECTIVE STATEMENT: Patient reports she is in need of a new chair as her previous chair has been in shop and uncertain if will be able to be repaired. She reports has a Sport and exercise psychologistloaner chair that is ill fitting and seeking new chair. She presents today for new power wheelchair evaluation.   PERTINENT HISTORY:  Patient is an 88 year old female referred by Dr. Earnestine MealingVictoria [name] for a power wheelchair evaluation. She presents with past medical history including Tetraparesis from spinal cord syndrome/injury on 11/29/2017 as well as bilateral Hip fractures in July 2023. She has been w/c bound since 2019 and non-ambulatory. She presents with profound  BUE and LE weakness- inability to stand well, transfer, walk with limited grip in each hand from Tetraparesis/Spinal cord syndrome. She presents with limited sensation. Past medical history includes: HTN with CHF, chronic foley catheter, Squamous cell cancer, UTI's, Depression/anxiety, anemia.  PAIN:  Are you having pain? No  PRECAUTIONS: Fall  WEIGHT BEARING RESTRICTIONS: No  FALLS:  Has patient fallen in last 6 months? No  LIVING ENVIRONMENT: Lives with: lives with their family and lives in an extended care facility- [facility] Lives in: Other nursing facility Stairs: No Has following equipment at home: Wheelchair (power) and Ramped entry  OCCUPATION: Retired  PLOF: Needs assistance with ADLs, Needs assistance with homemaking, and Needs assistance with transfers  PATIENT GOALS: To obtain a new power wheelchair that accommodates her impairments to achieve more independence.    PATIENT INFORMATION: This Evaluation form will serve as the LMN for the following suppliers:  Supplier:  [employer] Contact Person: Harrie JeansBrandon Sisk Phone: [phone]   Reason for Referral: Need for new Power wheelchair -due to current w/c in disrepair.  Patient/caregiver Goals: Patient was seen for face-to-face evaluation for new power wheelchair.  Also present was    Harrie JeansBrandon Sisk to discuss recommendations and wheelchair options.  Further paperwork was completed and sent to vendor.  Patient appears to qualify for power mobility device at this time per objective findings.    MEDICAL HISTORY: Diagnosis: Tetraparesis Primary Diagnosis Onset: 05/20/2017 [] Progressive Disease Relevant Past and Future Surgeries: Cervical Fusion 11/29/2017; Left Hip sx 08/16/2021 Height: 5'7 Weight:174 lbs. Explain and recent changes or trends in weight: No recent changes  Relevant History including falls: Patient is an 88 year old female referred by Dr. Earnestine MealingVictoria [name] for a power wheelchair evaluation. She presents with  past medical history including Tetraparesis from spinal cord syndrome/injury on 11/29/2017 as well as bilateral Hip fractures in July 2023. She has been w/c bound since 2019 and non-ambulatory. She presents with profound BUE and LE weakness- inability to stand well, transfer, walk with limited grip in each hand from Tetraparesis/Spinal cord syndrome. She presents with limited sensation. She requires dependent Michiel SitesHoyer for transfers and had 1 fall in 2023 resulting in bilateral hip fractures.    HOME ENVIRONMENT: [x] House  [] Condo/town home  [] Apartment  [] Assisted Living    [] Lives Alone [x]  Lives with Others     Husband                                                Hours with caregiver: 8+ hours/day  [x] Home is accessible to patient            Stairs  [] Yes [x]  No     Ramp [x] Yes [] No Comments:     COMMUNITY  ADL: TRANSPORTATION: [] Car    [x] Van    [] Public Transportation    [] Adapted w/c Lift   [] Ambulance   [] Other:       [x] Sits in wheelchair during transport  Employment/School:     Specific requirements pertaining to mobility                                                     Other:                                       FUNCTIONAL/SENSORY PROCESSING SKILLS:  Handedness:   [x] Right     [] Left    [] NA  Comments:                                 Functional Processing Skills for Wheeled Mobility [x] Processing Skills are adequate for safe wheelchair operation  Areas of concern than may interfere with safe operation of wheelchair Description of problem   []  Attention to environment     [] Judgment     []  Hearing  []  Vision or visual processing    [] Motor Planning  []  Fluctuations in Behavior                                                   VERBAL COMMUNICATION: [x] WFL receptive [x]  WFL expressive [x] Understandable  [] Difficult to understand  [] non-communicative []  Uses an augmented communication device    CURRENT SEATING / MOBILITY: Current Mobility Base: Q6 edge MPO   [] None  [] Dependent  [] Manual  [] Scooter  [x] Power   Type of Control:      Clinical cytogeneticist:    Pride                     Size:         20x20                Age:        88 years old                   Current Condition of Mobility Base:     Disrepair                                                                                                                Current Wheelchair components:      Tilt and recline, seat and leg elevate  Describe posture in present seating system:          shifted to right hip with   head not touching head rest and left LE at edge of foot rest                                                                SENSATION and SKIN ISSUES: Sensation [] Intact [x] Impaired [] Absent   Level of sensation:   intermittent loss of sensation with light touch                        Pressure Relief: Able to perform effective pressure relief :   [] Yes  [x]  No Method:                                                                              If not, Why?:   Due to both UE and LE weakness from spinal cord syndrome- incomplete injury with some paralysis                                                                       Skin Issues/Skin Integrity Current Skin Issues   [] Yes [x] No  [x] Intact []  Red area []  Open Area  [] Scar Tissue [x] At risk from prolonged sitting  Where: sacrum and ischial tuberosities                              History of Skin Issues   [x] Yes [] No  Where     sacurm                                    When : patient reports only 1 time a couple of years ago                                          Hx of skin flap surgeries [] Yes [x] No  Where                  When                                                  Limited sitting tolerance [] Yes [x] No Hours spent sitting in wheelchair daily:  Complaint of Pain:  Please describe:   No                                                                                                          Swelling/Edema:      INTERMITTENT- Patient reports wears support stockings                                                                                                                                     ADL STATUS (in reference to wheelchair use):  Indep Assist Unable Indep with Equip Not assessed Comments  Dressing                 X                                            Patient requires physical assist with all ADL's             Eating                 X                                                                                                             Toileting                X  Bathing               X                                                                                                                       Grooming/ Hygiene               X                                                                                                               Meal Prep                X                                                                                                          IADLS                X                                                                                                  Bowel Management: [] Continent  [x] Incontinent  [] Accidents Comments:  bowel program                                                Bladder Management: [] Continent  [x] Incontinent  [] Accidents Comments:  catheter  WHEELCHAIR SKILLS: Manual w/c Propulsion: [] UE or LE strength and endurance sufficient to participate in ADLs using manual wheelchair Arm :  [] left [] right  [] Both                                   Foot:   [] left [] right   [] Both  Distance:   Operate Scooter: []  Strength, hand grip, balance and transfer appropriate for use [] Living environment is accessible for use of scooter  Operate Power w/c:  [x]  Std. Joystick  (u-Shape joystick knob)  []  Alternative Controls Indep []  Assist []  Dependent/ Unable []  N/A []  [] Safe          []  Functional      Distance:                Bed confined without wheelchair [x]  Yes []  No   STRENGTH/RANGE OF MOTION:  Range of Motion Strength R/L  Shoulder                                                         2-/2-                                                    Elbow                                                  3/3+                                                              Wrist/Hand                                                                  2-/2- wrist ext; 3+/3+ wrist flex                                                                       Hip                                                                2-/3+  Knee                                                              3-/3+                                                                Ankle    2-/2-                                                                   MOBILITY/BALANCE:  [x]  Patient is totally dependent for mobility                                                                                               Balance Transfers Ambulation  Sitting Balance: Standing Balance: []  Independent []  Independent/Modified Independent  []  WFL     []  WFL []  Supervision []  Supervision  [x]  Uses UE for balance  []  Supervision []  Min Assist []  Ambulates with Assist                           []  Min Assist []  Min assist []  Mod Assist []  Ambulates with Device:  []  RW   []  StW   []  Cane   []                 []  Mod Assist []  Mod assist []  Max assist   []  Max Assist [x]  Max assist [x]  Dependent []  Indep. Short Distance Only  []  Unable []   Unable [x]  Lift / Sling Required Distance (in feet)                             []  Sliding board [x]  Unable to Ambulate: (Explain: Patient has not functionally ambulated since her injury in 2019  Cardio Status:  [x] Intact  []  Impaired   []  NA                              Respiratory Status:  [x] Intact   [] Impaired   [] NA                                     Orthotics/Prosthetics:     Left brace to prevent foot drop and R ankle brace for med/lateral support  Comments (Address manual vs power w/c vs scooter):    Patient does not possess adequate UE strength (shoulder/arm/or grip)  to sustain household or community distances with manual w/c since her injury in 2019. She has been using a power w/c since her injury. She does not have enough trunk control or sitting balance, or ability to transfer on/off for safe use of scooter nor adequate UE strength to steer a power scooter. Due to these significant impairments - a power w/c that she has been using for the past 4 years would continue to be the most appropriate device for safe mobility to optimize her independence with mobility and decrease her risk of falling.                                     Anterior / Posterior Obliquity Rotation-Pelvis  PELVIS    [] Neutral  [x]  Posterior  []  Anterior     [x] WFL  [] Right Elevated  [] Left Elevated   [x] WFL  [] Right Anterior []   Left Anterior    []  Fixed []  Partly Flexible [x]  Flexible  []  Other  []  Fixed  []  Partly Flexible  [x]  Flexible []  Other  []  Fixed  []  Partly Flexible  [x]  Flexible []  Other  TRUNK [x] WFL [] Thoracic Kyphosis [] Lumbar Lordosis   []  WFL [x] Convex Right [] Convex Left   [x] c-curve [] s-curve [] multiple  [x]  Neutral []  Left-anterior []  Right-anterior    []  Fixed []  Flexible []  Partly Flexible       Other  []  Fixed []  Flexible []  Partly Flexible []  Other  []  Fixed           []  Flexible []   Partly Flexible []  Other   Position Windswept   HIPS  []  Neutral []  Abduct [x]  ADduct [x]  Neutral []  Right []  Left       []  Fixed  []  Partly Flexible             []  Dislocated [x]  Flexible []  Subluxed    []  Fixed []  Partly Flexible  [x]  Flexible []  Other              Foot Positioning Knee Positioning   Knees and  Feet  []  WFL [x] Left [] Right []  WFL [x] Left [] Right   KNEES ROM concerns: ROM concerns:   & Dorsi-Flexed                    [] Lt [] Rt          Left knee unable to flex >70 deg due to increased tone/contracture                        FEET Plantar Flexed                  [x] Lt [] Rt     Inversion                    [] Lt [] Rt     Eversion                    [] Lt [] Rt    HEAD [x]  Functional []  Good Head Control   & []  Flexed         []  Extended [x]  Adequate Head Control   NECK []  Rotated  Lt  []  Lat Flexed Lt []  Rotated  Rt []  Lat Flexed Rt []  Limited Head Control    []   Cervical Hyperextension []  Absent  Head Control    SHOULDERS ELBOWS WRIST& HAND         Left     Right    Left     Right  U/E [x] Functional  Left            [x] Functional  Right                                 [] Fisting             []  Fisting     [] elevated Left [] depressed  Left [] elevated Right [] depressed  Right   Unable to flex fingers well bilaterally   [] protracted Left [] retracted Left [] protracted Right [] retracted Right [] subluxed  Left              [] subluxed  Right         Goals for Wheelchair Mobility  [x]  Independence with mobility in the home with motor related ADLs (MRADLs)  [x]  Independence with MRADLs in the community [x]  Provide dependent mobility  [x]  Provide recline     [x] Provide tilt   Goals for Seating system [x]  Optimize pressure distribution [x]  Provide support needed to facilitate function or safety [x]  Provide corrective forces to assist with maintaining or improving posture [x]  Accommodate client's posture: current seated postures and positions are  not flexible or will not tolerate corrective forces [x]  Client to be independent with relieving pressure in the wheelchair [x] Enhance physiological function such as breathing, swallowing, digestion  Simulation ideas/Equipment trials:                                                                                                State why other equipment was unsuccessful:      Patient is currently non-ambulatory- dependent on hoyer for all transfers, unable to effectively negotiate a manual w/c or scooter due to weakness associated with incomplete spinal cord injury with tetraparesis. Patient is in need of power based wheelchair with power tilt to improve her ability to be more independent with pressure relief, transfers, edema control and comfort. She will benefit from the below mentioned recommendations including justifications to maximize her overall independence with all mobility and to improve her quality of life with decreased risk of pressure injury or falls. She will require a [employer] M3 model power based wheelchair to satisfy her current w/c needs.                                                               MOBILITY BASE RECOMMENDATIONS and JUSTIFICATION: MOBILITY COMPONENT JUSTIFICATION  Manufacturer:   [employer]        Model:   M3            Size: Width   21in        Seat Depth  20in       [x] provide transport from point A to B [x] promote Indep mobility  [x] is not a safe, functional ambulator [x] walker or cane inadequate [] non-standard width/depth necessary to accommodate anatomical measurement []                             [] Manual Mobility Base [] non-functional ambulator    [] Scooter/POV  [] can safely operate  [] can safely transfer   [] has adequate trunk stability  [] cannot functionally propel manual w/c  [x] Power Mobility Base  [x] non-ambulatory  [x] cannot functionally propel manual wheelchair  [x]  cannot functionally and safely operate scooter/POV [x] can safely operate and  willing to  [] Stroller Base [] infant/child  [] unable to propel manual wheelchair [] allows for growth [] non-functional ambulator [] non-functional UE [] Indep mobility is not a goal at this time  [x] Tilt  [] Forward                   [] Backward                  [x] Powered tilt              [] Manual tilt  [x] change position against gravitational force on head and shoulders  [x] change position for pressure relief/cannot weight shift [x] transfers  [x] management of tone [x] rest periods [x] control edema [] facilitate postural control  []                                       [x] Recline  [x] Power recline on power base [] Manual recline on manual base  [x] accommodate femur to back angle  [x] bring to full recline for ADL care  [x] change position for pressure relief/cannot weight shift [x] rest periods [x] repositioning for transfers or clothing/diaper /catheter changes [x] head positioning  [] Lighter weight required [] self- propulsion  [] lifting []                                                 [] Heavy Duty required [] user weight greater than 250# [] extreme tone/ over active movement [] broken frame on previous chair []                                     [x]  Back  [x]  Angle Adjustable []  Custom molded                           [x] postural control [x] control of tone/spasticity [x] accommodation of range of motion [x] UE functional control [x] accommodation for seating system []                                          [x] provide lateral trunk support [x] accommodate deformity [x] provide posterior trunk support [x] provide lumbar/sacral support [x] support trunk in midline [x] Pressure relief over spinal processes  [x]  Seat Cushion                       [x] impaired sensation  [] decubitus ulcers present [x] history of pressure ulceration [] prevent pelvic extension [] low maintenance  [x] stabilize pelvis  [x] accommodate obliquity [x] accommodate multiple deformity [x] neutralize lower extremity  position [x] increase pressure distribution []                                           [  x] Pelvic/thigh support  [x]  Lateral thigh guide []  Distal medial pad  []  Distal lateral pad [x]  pelvis in neutral [x] accommodate pelvis [x]  position upper legs []  alignment [x]  accommodate ROM [x]  decrease adduction [x] accommodate tone [x] removable for transfers [x] decrease abduction  [x]  Lateral trunk Supports [x]  Lt     [x]  Rt [x] decrease lateral trunk leaning [x] control tone [x] contour for increased contact [x] safety  [x] accommodate asymmetry []                                                 [x]  Mounting hardware  [x] lateral trunk supports  [x] back   [x] seat [x] headrest      [x]  thigh support [] fixed   [x] swing away [x] attach seat platform/cushion to w/c frame [x] attach back cushion to w/c frame [x] mount postural supports [x] mount headrest  [] swing medial thigh support away [x] swing lateral supports away for transfers  []                                                     Armrests  [] fixed [x] adjustable height [] removable   [] swing away  [x] flip back   [] reclining [x] full length pads-Gel [] desk    [] pads tubular  [x] provide support with elbow at 90   [] provide support for w/c tray [x] change of height/angles for variable activities [x] remove for transfers [x] allow to come closer to table top [x] remove for access to tables []                                               Hangers/ Leg rests  [] 60 [] 70 [] 90 [] elevating [] heavy duty  [x] articulating [] fixed [] lift off [] swing away     [x] power [x] provide LE support  [x] accommodate to hamstring tightness [x] elevate legs during recline   [x] provide change in position for Legs [x] Maintain placement of feet on footplate [x] durability [x] enable transfers [x] decrease edema [x] Accommodate lower leg length []                                         Foot support Footplate    [x] Lt  [x]  Rt  [x]  Center mount [x] flip up                             [x] depth/angle adjustable [] Amputee adapter    []  Lt     []  Rt [x] provide foot support [x] accommodate to ankle ROM [x] transfers [x] Provide support for residual extremity [x]  allow foot to go under wheelchair base [x]  decrease tone  []                                                 []  Ankle strap/heel loops [] support foot on foot support [] decrease extraneous movement [] provide input to heel  [] protect foot  Tires: [] pneumatic  [] flat free inserts  [] solid  [] decrease maintenance  [] prevent frequent flats [] increase shock absorbency [] decrease pain  from road shock [] decrease spasms from road shock []                                              [x]  Headrest  [x] provide posterior head support [x] provide posterior neck support [x] provide lateral head support [] provide anterior head support [x] support during tilt and recline [x] improve feeding   [x] improve respiration [] placement of switches [x] safety  [x] accommodate ROM  [x] accommodate tone [x] improve visual orientation  [x]  Anterior chest strap []  Vest []  Shoulder retractors  [x] decrease forward movement of shoulder [] accommodation of TLSO [x] decrease forward movement of trunk [] decrease shoulder elevation [x] added abdominal support [x] alignment [x] assistance with shoulder control  []                                               Pelvic Positioner [x] Belt [] SubASIS bar [] Dual Pull [x] stabilize tone [x] decrease falling out of chair/ **will not Decrease potential for sliding due to pelvic tilting [x] prevent excessive rotation [x] pad for protection over boney prominence [x] prominence comfort [] special pull angle to control rotation []                                                  Upper ExtremitySupport  [] L   []  R [] Arm trough   [] hand support []  tray       [] full tray [] swivel mount [] decrease edema      [] decrease subluxation   [] control tone   [] placement for AAC/Computer/EADL [] decrease  gravitational pull on shoulders [] provide midline positioning [] provide support to increase UE function [] provide hand support in natural position [] provide work surface   POWER WHEELCHAIR CONTROLS  [x] Proportional  [] Non-Proportional Type                                      [] Left  [x] Right [x] provides access for controlling wheelchair   [] lacks motor control to operate proportional drive control [] unable to understand proportional controls  Actuator Control Module  [] Single  [x] Multiple   [x] Allow the client to operate the power seat function(s) through the joystick control   [] Safety Reset Switches [] Used to change modes and stop the wheelchair when driving in latch mode    [x] Upgraded Electronics   [x] programming for accurate control [] progressive Disease/changing condition [] non-proportional drive control needed [x] Needed in order to operate power seat functions through joystick control   [] Display box [] Allows user to see in which mode and drive the wheelchair is set  [] necessary for alternate controls    [] Digital interface electronics [] Allows w/c to operate when using alternative drive controls  [] ASL Head Array [] Allows client to operate wheelchair  through switches placed in tri-panel headrest  [] Sip and puff with tubing kit [] needed to operate sip and puff drive controls  [] Upgraded tracking electronics [] increase safety when driving [] correct tracking when on uneven surfaces  [x] Mount for switches or joystick [x] Attaches switches to w/c  [x] Swing away for access or transfers [] midline for optimal placement [x] provides for consistent access  [] Attendant controlled joystick plus mount [] safety [] long distance driving [] operation of seat functions [] compliance with transportation  regulations []                                             Rear wheel placement/Axle adjustability [] None [] semi adjustable [] fully adjustable  [] improved UE access to wheels [] improved  stability [] changing angle in space for improvement of postural stability [] 1-arm drive access [] amputee pad placement []                                Wheel rims/ hand rims  [] metal   [] plastic coated [] oblique projections           [] vertical projections [] Provide ability to propel manual wheelchair  []  Increase self-propulsion with hand weakness/decreased grasp  Push handles [] extended   [] angle adjustable              [] standard [] caregiver access [] caregiver assist [] allows "hooking" to enable increased ability to perform ADLs or maintain balance  One armed device   [] Lt   [] Rt [] enable propulsion of manual wheelchair with one arm   []                                            Brake/wheel lock extension []  Lt   []  Rt [] increase indep in applying wheel locks   [] Side guards [] prevent clothing getting caught in wheel or becoming soiled []  prevent skin tears/abrasions  Battery:     [id number]                                       [x] to power wheelchair                                                         Other:    LED light kit                                  [x] enable navigation of power wheelchair in dim lighting environment or mobilization at night for optimal safe mobility                                                            Other:  USB outlet cable     [x]  Allow patient to utilize power chair to charge electronic items and avoid excessive reaching or possibly falling out of chair -for optimal safety  Other: U- shape joystick     [x]  Unable to grip standard joystick controller well due to severe weakness in fingers and U-shape shaft would allow her to safely utilize joystick with more precision with operating device for independent mobility.   Other- Elevator on mobility base- Power wheelchair  [x]   increase Indep in transfers  increase Indep in ADLs  bathroom function and  safety  kitchen/cooking function and safety  shopping  Reaching countertops and cabinets above normal  height of wheelchair raise height for communication at standing level raise height for eye contact which reduces cervical neck strain and pain drive at raised height for safety and navigating crowds   The above equipment has a life- long use expectancy. Growth and changes in medical and/or functional conditions would be the exceptions. This is to certify that the therapist has no financial relationship with durable medical provider or manufacturer. The therapist will not receive remuneration of any kind for the equipment recommended in this evaluation.   Patient has mobility limitation that significantly impairs safe, timely participation in one or more mobility related ADL's. (bathing, toileting, feeding, dressing, grooming, moving from room to room)  [x]  Yes []  No  Will mobility device sufficiently improve ability to participate and/or be aided in participation of MRADL's?      [x]  Yes []  No  Can limitation be compensated for with use of a cane or walker?                                    []  Yes [x]  No  Does patient or caregiver demonstrate ability/potential ability & willingness to safely use the mobility device?    [x]  Yes []  No  Does patient's home environment support use of recommended mobility device?            [x]  Yes []  No  Does patient have sufficient upper extremity function necessary to functionally propel a manual wheelchair?     []  Yes [x]  No  Does patient have sufficient strength and trunk stability to safely operate a POV (scooter)?                                  []  Yes [x]  No  Does patient need additional features/benefits provided by a power wheelchair for MRADL's in the home?        []  Yes []  No  Does the patient demonstrate the ability to safely use a power wheelchair?                   [x]  Yes []  No     Physician's Name Printed:               Dr. Earnestine Mealing                                         Physician's Signature:  Date:     This is to certify  that I, the above signed therapist have the following affiliations: []  This DME provider []  Manufacturer of recommended equipment []  Patient's long term care facility [x]  None of the above  Therapist Name/Signature:     Louis Meckel, PT                                       Date: 04/27/2022  ASSESSMENT:  CLINICAL IMPRESSION:  Patient is an 88 year old female referred by Dr. Earnestine Mealing for a power wheelchair evaluation. 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 She has been w/c bound since 2019 and non-ambulatory. She presents with profound BUE and LE weakness- inability to stand well, transfer, walk with limited grip in each hand from Tetraparesis/Spinal cord syndrome. She presents with limited sensation. She requires dependent Michiel Sites for transfers and had 1 fall in 2023 resulting in bilateral hip fractures.  Patient does not possess adequate UE strength (shoulder/arm/or grip)  to sustain household or community distances with manual w/c since her injury in 2019. She has been using a power w/c since her injury. She does not have enough trunk control or sitting balance, or ability to transfer on/off for safe use of scooter nor adequate UE strength to steer a power scooter. Due to these significant impairments - a power w/c that she has been using for the past 4 years would continue to be the most appropriate device for safe mobility to optimize her independence with mobility and decrease her risk of falling.   Patient is currently non-ambulatory- dependent on hoyer for all transfers, unable to effectively negotiate a manual w/c or scooter due to weakness associated with incomplete spinal cord injury with tetraparesis. Patient is in need of power based wheelchair with power tilt to improve her ability to be more independent with pressure relief, transfers, edema control and comfort. She  will benefit from the enclosed mentioned recommendations including justifications to maximize her overall independence with all mobility and to improve her quality of life with decreased risk of pressure injury or falls. She will require a [employer]il M3 model power based wheelchair to satisfy her current w/c needs.                               OBJECTIVE IMPAIRMENTS: Abnormal gait, decreased activity tolerance, decreased balance, decreased coordination, decreased endurance, decreased mobility, difficulty walking, decreased ROM, decreased strength, hypomobility, impaired flexibility, impaired sensation, impaired tone, and impaired UE functional use.   ACTIVITY LIMITATIONS: carrying, lifting, bending, standing, squatting, stairs, transfers, bed mobility, continence, bathing, toileting, dressing, self feeding, reach over head, and hygiene/grooming  PARTICIPATION LIMITATIONS: meal prep, cleaning, laundry, driving, shopping, community activity, and yard work  PERSONAL FACTORS: Age and HTN, Depression, Anemia  are also affecting patient's functional outcome.   REHAB POTENTIAL: Fair    CLINICAL DECISION MAKING: Stable/uncomplicated  EVALUATION COMPLEXITY: Low  GOALS: Target date:  Wheelchair evaluation     Pt and caregivers will understand PT recommendation and appropriate/safe use for wheelchair and seating for home use. Baseline: Need for new power wheelchair as current one is in disrepair Goal status: MET  PLAN:  PT FREQUENCY:  1 time Wheelchair evaluation  PT DURATION: 1 time Wheelchair evaluation  PLANNED INTERVENTIONS: wheelchair eval.  PLAN FOR NEXT SESSION: Wheelchair eval   Lenda Kelp, PT 05/12/2022, 1:47 PM

## 2022-04-26 NOTE — Therapy (Addendum)
Occupational Therapy Treatment Note      Patient Name: Sherry [name] MRN: [id number] DOB:1934/06/04, 88 y.o., female Today's Date: 04/26/2022                                         PCP: Dr. Viviana Simpler REFERRING PROVIDER: Dr. Viviana Simpler   OT End of Session - 04/26/22 1114     Visit Number [id number]    Number of Visits 66    Date for OT Re-Evaluation 06/30/22    Authorization Time Period Progress report period starting 12/14/2021    OT Start Time 75    OT Stop Time 1145    OT Time Calculation (min) 45 min    Activity Tolerance Patient tolerated treatment well    Behavior During Therapy [facility] for tasks assessed/performed                  Past Medical History:  Diagnosis Date   Acute blood loss anemia    Arthritis    Cancer ([place])    skin   Central cord syndrome at C6 level of cervical spinal cord ([place]) 11/29/2017   Hypertension    Protein-calorie malnutrition, severe ([place]) 01/24/2018   S/P insertion of IVC (inferior vena caval) filter 01/24/2018   Tetraparesis ([place])    Past Surgical History:  Procedure Laterality Date   ANTERIOR CERVICAL DECOMP/DISCECTOMY FUSION N/A 11/29/2017   Procedure: Cervical five-six, six-seven Anterior Cervical Decompression Fusion;  Surgeon: Judith Part, MD;  Location: [place];  Service: Neurosurgery;  Laterality: N/A;  Cervical five-six, six-seven Anterior Cervical Decompression Fusion   CATARACT EXTRACTION     FEMUR IM NAIL Left 08/16/2021   Procedure: INTRAMEDULLARY (IM) NAIL FEMORAL;  Surgeon: Earnestine Leys, MD;  Location: [facility];  Service: Orthopedics;  Laterality: Left;   IR IVC FILTER PLMT / S&I /IMG GUID/MOD SED  12/08/2017   Patient Active Problem List   Diagnosis Date Noted   Hypertensive heart disease with other congestive heart failure ([place]) 02/14/2022   Chronic indwelling Foley catheter 02/14/2022   Closed hip fracture ([place]) 08/15/2021   DVT (deep venous thrombosis) ([place]) 08/15/2021   Quadriplegia ([place])  08/15/2021   Fall 08/15/2021   Constipation due to slow transit 08/31/2018   Trauma 06/05/2018   Neuropathic pain 06/05/2018   Neurogenic bowel 06/05/2018   Vaginal yeast infection 01/30/2018   Healthcare-associated pneumonia 01/25/2018   Chronic allergic rhinitis 01/24/2018   Depression with anxiety 01/24/2018   UTI due to Klebsiella species 01/24/2018   Protein-calorie malnutrition, severe ([place]) 01/24/2018   S/P insertion of IVC (inferior vena caval) filter 01/24/2018   Tetraparesis ([place]) 01/20/2018   Neurogenic bladder 01/20/2018   Reactive depression    Benign essential HTN    Acute postoperative anemia due to expected blood loss    Central cord syndrome at C6 level of cervical spinal cord ([place]) 11/29/2017   Allergy to alpha-gal 11/25/2016   SCC (squamous cell carcinoma) 04/08/2014    REFERRING DIAG: Central Cord Syndrome at C6 of the Cervical Spinal Cord, Fall with Bilateral Closed Hip Fractures, with ORIF repair with Intramedullary Nailing of the Left Hip Fracture.  THERAPY DIAG:  Muscle weakness (generalized)  Rationale for Evaluation and Treatment Rehabilitation  PERTINENT HISTORY: Patient s/p fall November 29, 2017 resulting in diagnosis of central cord syndrome at C 6 level.  She has had therapy in multiple venues but with recent  move to [facility], therapy staff recommended she seek outpatient therapy for her needs. Pt. Had a recent fall with Bilateral Closed Hip Fractures, with ORIF repair with Intramedullary Nailing of the Left Hip Fracture.   PRECAUTIONS: None  SUBJECTIVE:   Pt. reports doing well today.  PAIN:  Are you having pain? No     OBJECTIVE:   Manual Therapy:   Pt. tolerated soft tissue massage to the volar, and dorsal surface of each digit on the bilateral hands, and thenar eminence secondary to stiffness following moist heat modality. Soft tissue mobilization was performed for Carpal, and metacarpal spread stretches for the left hand  only.(Dermatological surgical site at the dorsal of the right hand still healing with a bandage present). Manual therapy was performed independent of, and in preparation for therapeutic Ex.   Therapeutic Ex.    Pt. tolerated PROM followed by AROM bilateral wrist extension, PROM for bilateral digit MP, PIP, and DIP flexion, and extension, thumb radial, and palmar abduction.   Pt. had an ER visit approximately 1 week ago due to sustaining an arm injury when getting her arm caught in a heavy fire door at [facility] where she currently resides. Proximal UE exercises are being withheld at this time. Pt. tolerates distal bilateral wrist, and digit  ROM well. Pt. continues to present with tightness at the bilateral thumb webspace, and bilateral wrist flexor tightness, as well as digit MP, PIP, and DIP extensor tightness which continues to limit her ability to complete ADL tasks efficiently. Pt. continues to benefit from working on impoving ROM in order to work towards increasing bilateral hand grasp on objects, and increase engagement of her bilateral hands during ADLs, and IADL tasks.    PATIENT EDUCATION: RUE Distal ROM, and strength Person educated: Patient Education method: Explanation and Demonstration Education comprehension: verbalized understanding, returned demonstration, and needs further education   HOME EXERCISE PROGRAM Continue with ongoing HEPs  Measurements:  12/09/2021:  Shoulder flexion: R: 120(136), L: 138(150) Shoulder abduction: R: 108(120), L: 110(120) Elbow: R: 0-148, L: 0-146 Wrist flexion: R: 55(62), L: 78 Writ extension: R: 34(55), L: 3(4) RD: R: 12(20), L: 16(26) UD: R: 8(24), L: 6(18) Thumb radial abduction: R: 11(20), L: 8(20) Digit flexion to the [facility]: R:  2nd: 5cm(3.5cm), 3rd: 7.5 cm(5cm), 4th: 7cm(3cm), 5th: 6cm(5.5cm) L: 2nd: 8cm(8cm), 3rd: 8cm(8cm), 4th: 7.5cm(7cm), 5th: 7cm(7cm)  02/03/2022:   Shoulder flexion: R: 122(138), L: 148(154) Shoulder abduction:  R: 109(125), L: 125(125 Elbow: R: 0-148, L: 0-146 Wrist flexion: R: 60(68), L: 79 Writ extension: R: 40(55), L: 3(10) RD: R: 14(24), L: 20(28) UD: R: 8(24), L: 6(18) Thumb radial abduction: R: 20(25), L: 8(20) Digit flexion to the [facility]: R:  2nd: 4.5cm(3cm), 3rd: 6.5cm(4.5cm), 4th: 6.5 cm(3cm), 5th: 4.5cm(5cm) L: 2nd: 8cm(8cm), 3rd: 8cm(7cm), 4th: 7.5cm(7cm), 5th: 6.5cm(6.5cm)   04/07/2022:   Shoulder flexion: R: 125(150), L: 160(160) Shoulder abduction: R: 109(125), L: 125(125 Elbow: R: 0-148, L: 0-146 Wrist flexion: R: 60(68), L: 79 Writ extension: R: 40(55), L: 5(18) RD: R: 14(24), L: 20(28) UD: R: 18(24), L: 8(18) Thumb radial abduction: R: 20(25), L: 8(20) Digit flexion to the [facility]: R:  2nd: 4 cm(4cm), 3rd: 6.5cm(4cm), 4th: 7 cm (5cm), 5th: 6cm(5cm) L: 2nd: 8cm(8cm), 3rd: 8cm(7cm), 4th: 7.5cm(7cm), 5th: 7cm(7cm)           OT Long Term Goals - 07/06/21 1106  Target Date: 06/30/2022    OT LONG TERM GOAL #1   Title Patient and caregiver will demonstrate understanding of home exercise program for ROM.    Baseline 12/09/2021: Pt. Reports the restorative ROM program has not resumed at the facility. 10/28/2021: Pt. Is attempting exercises/stretches at home. The facility restorative aides have not resumed exercises. 09/14/2021:  Pt. Reports inconsistencies with restorative ROM at  her care home. Pt/caregiver education to be provided about ongoing ROM needs.    Time 12    Period Weeks    Status Deferred     OT LONG TERM GOAL #2   Title Pt.  will improve BUE strength to be able to sustain her UE's in elevation to efficiently, and effectively perform hair care, and self-grooming tasks.   Baseline 02/03/2022: Pt. has improved, and is now able to sustain her Bilateral UEs in elevation long enough to efficiently, and effectively perform haircare, and self-grooming tasks. 12/09/2021: right shoulder 4-/5,  left shoulder 4/5. Pt. has improved to be able to reach up for hair care, and self-grooming tasks. 10/28/2021: Right shoulder 3+/5, Left shoulder 4-/5 09/14/2021:  BUE shoulder strength: 3+/5 for shoulder flexion, and abduction.   Time 12    Period Weeks    Status Achieved               OT LONG TERM GOAL #3   Title Patient will demonstrate improved composite finger flexion to hold deodorant to apply to underarms.    Baseline 04/07/2022: Pt. Has improved with right 2nd, and 3rd digit flexion towards the [facility]. Pt. Continues to have difficulty securely holding and applying deodorant.02/03/2022: Pt. Has improved with digit flexion, however continues to have difficulty securely holding and using deoderant. 12/09/2021: Bilateral hand/digit MP, PIP, and DIP extension tightness limits her ability achieve digit flexion to hold, and apply deodorant. 10/28/2021:  Pt. Continues to have difficulty holding the deodorant. 01/12/2021: Pt. Presents with limited digit extension. Pt. Is able to initiate holding deodorant, however is unable to hold it while using it.   Time 12    Period Weeks    Status Ongoing   Target Date                  OT LONG TERM GOAL #4   Title Pt. Will improve bilateral wrist extension in preparation for anticipating, and initiating reaching for objects at the table.    Baseline 04/07/2022: Rght: 40(55) Left: 5(18) 02/03/2022: Right: 34(55) Left: 3(4) 12/09/2021: Right  34(55)  Left  3(4) 10/28/2021:  Right 22(38), Left 0(15) 09/14/2021:  Right: 17(35), left 2(15)   Time 12   Period weeks   Status Ongoing   Target Date      OT LONG TERM GOAL #5   Title Pt. will write, and sign her name with 100% legibility, and modified independence.    Baseline 04/07/2022: Pt. Reports that she continues to present with shaky appearing writing. Pt.reports that she is writing more when filling out her menu, and complete puzzles 02/03/2022: Pt. Continues to report legibility appears "shaky" when attempting signing  her name. Pt.reports that she is writing more when filling out her menu, and complete puzzles.12/09/2021: Pt. Reports legibility appears "shaky" when attempting signing her name. Pt.reports that she is writing more when filling out her menu. 10/28/2021: pt. is able to sign her name, however legibility continues to be inconsistent. Pt. Reports "shaky appearance". 09/14/2021: Pt. continues to fill out her daily menu and complete puzzles. Pt. Continues to present with  limited writing legibility.   Time 12    Period Weeks    Status  Ongoing         OT LONG TERM GOAL  #6   TITLE Pt. will turn pages in a book with modified independence.    Baseline 02/03/2022: Pt. Is able to effectively turn pages of a book. 10/28/2021: Pt. Is able to consistently turn book pages. 09/14/2021: Pt. has difficulty turning pages.    Time 12    Period Weeks    Status Achieved       OT LONG TERM GOAL  #7  TITLE Pt. Will increase  bilateral lateral pinch strength by 2 lbs to be able to securely grasp items during ADLs, and IADL tasks.   Baseline 04/07/2022: NT-Pinch meter out for calibration. 02/03/2022: Right: 6#, Left: 4# 12/09/2021:  Right: 6#, Left: 4#   Time 12  Period Weeks  Status Ongoing           OT LONG TERM GOAL  #8  TITLE Pt. Will button a sweater with large 1" buttons, and zip her jacket with modified independence.   Baseline 04/07/2022: Pt. Has difficulty manipulating buttons, however was introduced to a buttonhook. Pt. Was able to hold, and use a buttonhook for buttons on a shirt. 02/03/2022: Pt. is unable to manipulate buttons on her sweater, or zippers on her jacket.  Time 12  Period Weeks  Status Ongoing   OT LONG TERM GOAL  #9  TITLE Pt. will complete plant care with modified independence.  Baseline 04/07/2022: Pt. is now able to hold a cup and water her plants.02/03/2022: Pt. has difficulty caring for her plants.   Time 12  Period Weeks  Status Ongoing   OT LONG TERM GOAL  #10  TITLE Pt. will  demonstrate adaptive techniques to assist with the efficiency of self-dressing, or morning care tasks.  Baseline 04/07/2022: Pt. Continues to require assist with the efficiency of self-dressing,a nd morning care tasks.02/03/2022: Pt. requires assist from caregivers 2/2 time limitations during morning care.   Time 12  Period Weeks  Status Ongoing     Plan - 07/22/21 1737     Clinical Impression Statement  Pt. had an ER visit approximately 1 week ago due to sustaining an arm injury when getting her arm caught in a heavy fire door at [facility] where she currently resides. Proximal UE exercises are being withheld at this time. Pt. tolerates distal bilateral wrist, and digit  ROM well. Pt. continues to present with tightness at the bilateral thumb webspace, and bilateral wrist flexor tightness, as well as digit MP, PIP, and DIP extensor tightness which continues to limit her ability to complete ADL tasks efficiently. Pt. continues to benefit from working on impoving ROM in order to work towards increasing bilateral hand grasp on objects, and increase engagement of her bilateral hands during ADLs, and IADL tasks.                        OT Occupational Profile and History Detailed Assessment- Review of Records and additional review of physical, cognitive, psychosocial history related to current functional performance    Occupational performance deficits (Please refer to evaluation for details): ADL's;IADL's;Leisure;Social Participation    Body Structure / Function / Physical Skills ADL;Continence;Dexterity;Flexibility;Strength;ROM;Balance;Coordination;FMC;IADL;Endurance;UE functional use;Decreased knowledge of use of DME;GMC    Psychosocial Skills Coping Strategies    Rehab Potential Fair    Clinical Decision Making Several treatment options, min-mod task modification necessary  Comorbidities Affecting Occupational Performance: Presence of comorbidities impacting occupational performance     Comorbidities impacting occupational performance description: contractures of bilateral hands, dependent transfers,    Modification or Assistance to Complete Evaluation  No modification of tasks or assist necessary to complete eval    OT Frequency 2x / week    OT Duration 12 weeks    OT Treatment/Interventions Self-care/ADL training;Cryotherapy;Therapeutic exercise;DME and/or AE instruction;Balance training;Neuromuscular education;Manual Therapy;Splinting;Moist Heat;Passive range of motion;Therapeutic activities;Patient/family education    Plan continue to progress ROM of digits, wrists and shoulders as it pertains to completion of ADL tasks that pt values.    Consulted and Agree with Plan of Care Patient            Harrel Carina, MS, OTR/L  Harrel Carina, OT 04/26/2022, 3:07 PM      `

## 2022-04-27 ENCOUNTER — Ambulatory Visit: Payer: Medicare HMO

## 2022-04-27 DIAGNOSIS — M6281 Muscle weakness (generalized): Secondary | ICD-10-CM

## 2022-04-27 DIAGNOSIS — R2681 Unsteadiness on feet: Secondary | ICD-10-CM

## 2022-04-27 DIAGNOSIS — R262 Difficulty in walking, not elsewhere classified: Secondary | ICD-10-CM

## 2022-04-27 DIAGNOSIS — S14129S Central cord syndrome at unspecified level of cervical spinal cord, sequela: Secondary | ICD-10-CM

## 2022-04-27 DIAGNOSIS — R269 Unspecified abnormalities of gait and mobility: Secondary | ICD-10-CM

## 2022-04-27 DIAGNOSIS — R2689 Other abnormalities of gait and mobility: Secondary | ICD-10-CM

## 2022-04-27 DIAGNOSIS — S72001A Fracture of unspecified part of neck of right femur, initial encounter for closed fracture: Secondary | ICD-10-CM

## 2022-04-27 DIAGNOSIS — S72002A Fracture of unspecified part of neck of left femur, initial encounter for closed fracture: Secondary | ICD-10-CM

## 2022-04-27 DIAGNOSIS — R278 Other lack of coordination: Secondary | ICD-10-CM

## 2022-04-28 ENCOUNTER — Ambulatory Visit: Payer: Medicare HMO

## 2022-04-28 ENCOUNTER — Ambulatory Visit: Payer: Medicare HMO | Admitting: Occupational Therapy

## 2022-04-29 ENCOUNTER — Ambulatory Visit: Payer: Medicare HMO | Admitting: Occupational Therapy

## 2022-04-30 ENCOUNTER — Ambulatory Visit: Admission: RE | Admit: 2022-04-30 | Payer: Medicare HMO | Source: Ambulatory Visit

## 2022-05-03 ENCOUNTER — Ambulatory Visit: Payer: Medicare HMO | Attending: Internal Medicine | Admitting: Occupational Therapy

## 2022-05-03 ENCOUNTER — Ambulatory Visit: Payer: Medicare HMO

## 2022-05-03 DIAGNOSIS — S14129S Central cord syndrome at unspecified level of cervical spinal cord, sequela: Secondary | ICD-10-CM | POA: Insufficient documentation

## 2022-05-03 DIAGNOSIS — R278 Other lack of coordination: Secondary | ICD-10-CM

## 2022-05-03 DIAGNOSIS — R269 Unspecified abnormalities of gait and mobility: Secondary | ICD-10-CM | POA: Diagnosis present

## 2022-05-03 DIAGNOSIS — R2689 Other abnormalities of gait and mobility: Secondary | ICD-10-CM

## 2022-05-03 DIAGNOSIS — S72002A Fracture of unspecified part of neck of left femur, initial encounter for closed fracture: Secondary | ICD-10-CM | POA: Insufficient documentation

## 2022-05-03 DIAGNOSIS — M6281 Muscle weakness (generalized): Secondary | ICD-10-CM | POA: Diagnosis present

## 2022-05-03 DIAGNOSIS — S72001A Fracture of unspecified part of neck of right femur, initial encounter for closed fracture: Secondary | ICD-10-CM | POA: Diagnosis present

## 2022-05-03 DIAGNOSIS — R262 Difficulty in walking, not elsewhere classified: Secondary | ICD-10-CM | POA: Insufficient documentation

## 2022-05-03 DIAGNOSIS — R2681 Unsteadiness on feet: Secondary | ICD-10-CM | POA: Insufficient documentation

## 2022-05-03 LAB — CBC AND DIFFERENTIAL
HCT: 28 — AB (ref 36–46)
Hemoglobin: 9.3 — AB (ref 12.0–16.0)

## 2022-05-03 NOTE — Therapy (Signed)
OUTPATIENT PHYSICAL THERAPY NEURO TREATMENT  Patient Name: Sherry [name] MRN: [id number] DOB:Jan 29, 1935, 88 y.o., female Today's Date: 05/03/2022   PCP: Dewayne Shorter, MD REFERRING PROVIDER: Earnestine Leys   PT End of Session - 05/03/22 1253     Visit Number 38    Number of Visits 56    Date for PT Re-Evaluation 06/30/22    Authorization Time Period Initial cert [id number]; Recert [id number]; Recert [id number]    Progress Note Due on Visit 24    PT Start Time 1147    PT Stop Time 1222    PT Time Calculation (min) 35 min    Equipment Utilized During Treatment Gait belt    Activity Tolerance Patient tolerated treatment well    Behavior During Therapy WFL for tasks assessed/performed                           Past Medical History:  Diagnosis Date   Acute blood loss anemia    Arthritis    Cancer    skin   Central cord syndrome at C6 level of cervical spinal cord 11/29/2017   Hypertension    Protein-calorie malnutrition, severe 01/24/2018   S/P insertion of IVC (inferior vena caval) filter 01/24/2018   Tetraparesis    Past Surgical History:  Procedure Laterality Date   ANTERIOR CERVICAL DECOMP/DISCECTOMY FUSION N/A 11/29/2017   Procedure: Cervical five-six, six-seven Anterior Cervical Decompression Fusion;  Surgeon: Judith Part, MD;  Location: [facility];  Service: Neurosurgery;  Laterality: N/A;  Cervical five-six, six-seven Anterior Cervical Decompression Fusion   CATARACT EXTRACTION     FEMUR IM NAIL Left 08/16/2021   Procedure: INTRAMEDULLARY (IM) NAIL FEMORAL;  Surgeon: Earnestine Leys, MD;  Location: [facility];  Service: Orthopedics;  Laterality: Left;   IR IVC FILTER PLMT / S&I /IMG GUID/MOD SED  12/08/2017   Patient Active Problem List   Diagnosis Date Noted   Hypertensive heart disease with other congestive heart failure 02/14/2022   Chronic indwelling Foley catheter 02/14/2022   Closed hip fracture 08/15/2021    DVT (deep venous thrombosis) 08/15/2021   Quadriplegia 08/15/2021   Fall 08/15/2021   Constipation due to slow transit 08/31/2018   Trauma 06/05/2018   Neuropathic pain 06/05/2018   Neurogenic bowel 06/05/2018   Vaginal yeast infection 01/30/2018   Healthcare-associated pneumonia 01/25/2018   Chronic allergic rhinitis 01/24/2018   Depression with anxiety 01/24/2018   UTI due to Klebsiella species 01/24/2018   Protein-calorie malnutrition, severe 01/24/2018   S/P insertion of IVC (inferior vena caval) filter 01/24/2018   Tetraparesis 01/20/2018   Neurogenic bladder 01/20/2018   Reactive depression    Benign essential HTN    Acute postoperative anemia due to expected blood loss    Central cord syndrome at C6 level of cervical spinal cord 11/29/2017   Allergy to alpha-gal 11/25/2016   SCC (squamous cell carcinoma) 04/08/2014    ONSET DATE: 08/15/2021 (fall with B hip fx); Initial injury was in 2019- quadraplegia due to central cord syndrome of C6 secondary to neck fracture.   REFERRING DIAG: Bilateral Hip fx; Left Open- s/p Left IM nail femoral on 08/16/2021; Right closed  THERAPY DIAG:  Muscle weakness (generalized)  Other lack of coordination  Abnormality of gait and mobility  Difficulty in walking, not elsewhere classified  Other abnormalities of gait and mobility  Central cord syndrome, sequela  Closed fracture of both hips, initial encounter  Rationale for Evaluation and Treatment Rehabilitation  SUBJECTIVE:  SUBJECTIVE STATEMENT:    Patient reports her arm is healing well- Decided not to have MRI. States still waiting on replacement parts for w/c.  Pt accompanied by:  Paid caregiver-   PERTINENT HISTORY: Patient was previous patient (see previous episode for specifics) She experienced a fall at home on 08/15/2021 and was in [facility] from 08/15/2021- 08/20/2021.  Most recent history per Dr. Laurey Arrow on 08/20/2021.  Sherry [name] is a 88 y.o. female with  medical history significant of quadriplegia due to central cord syndrome of C6 secondary to neck fracture, hypertension, depression with anxiety, neurogenic bladder, bilateral DVT on Xarelto (s/p of IVC filter placement), who presents with fall and hip pain.   Patient has history of quadriplegia, and has been doing PT/OT with improvement of function. Pt has some use of both arms and can feed herself. She can walk a little bit with assistance. Pt accidentally fell at about 930 this morning after taking shower.  She injured her hips. Hospital Course:    Closed left hip fracture [facility]) s/p mechanical fall Closed right hip fracture (?old) Acute blood loss anemia --pt is s/p  INTRAMEDULLARY (IM) NAIL FEMORAL-left on 7/16  PAIN:  Are you having pain? NO: NPRS scale: 0/10 Pain location: Right hip Pain description: Ache Aggravating factors: any active Movement of right side Relieving factors: Rest  PRECAUTIONS: Fall  WEIGHT BEARING RESTRICTIONS Yes Bilateral non-weight bearing B LE's  FALLS: Has patient fallen in last 6 months? Yes. Number of falls 1  LIVING ENVIRONMENT: Lives with: lives with their family and lives in a nursing home Lives in: Other Long term care at [place]: No Has following equipment at home: Wheelchair (power), Ramped entry, and hoyer lift  PLOF: Needs assistance with ADLs, Needs assistance with homemaking, Needs assistance with gait, Needs assistance with transfers, and Leisure: Work puzzles, read, Watch TV  PATIENT GOALS Patient goal is to walk; Be able to strengthen my arms for better use  OBJECTIVE:   DIAGNOSTIC FINDINGS: Overall cognitive statusEXAM: DG HIP (WITH OR WITHOUT PELVIS) 2-3V LEFT; DG HIP (WITH OR WITHOUT PELVIS) 2-3V RIGHT   COMPARISON:  CT of the abdomen and pelvis 01/21/2020   FINDINGS: Left proximal femur fracture noted just below the intertrochanteric region. Marked ventral angulation noted on the cross-table image. Slight varus  angulation present   Comminuted intratrochanteric fracture present on the right also with marked ventral angulation.   IMPRESSION: 1. Bilateral hip fractures. 2. Comminuted intertrochanteric fracture of the right hip. 3. Angulated fracture of the proximal left femur just below the intertrochanteric region.     Electronically Signed   By: San Morelle M.D.   On: 08/15/2021 11:54:   COGNITION:  Within functional limits for tasks assessed   SENSATION: Light touch: Impaired   EDEMA:  None  Strength R/L 2-/2- Shoulder flexion (anterior deltoid/pec major/coracobrachialis, axillary n. (C5-6) and musculocutaneous n. (C5-7)) 2-/3+ (within available ROM around 130 on lef)Shoulder abduction (deltoid/supraspinatus, axillary/suprascapular n, C5) 3+/3+ Shoulder external rotation (infraspinatus/teres minor) 4/4 Shoulder internal rotation (subcapularis/lats/pec major) 3+/3+ Shoulder extension (posterior deltoid, lats, teres major, axillary/thoracodorsal n.) 3+/3+ Shoulder horizontal abduction 4/4 Elbow flexion (biceps brachii, brachialis, brachioradialis, musculoskeletal n, C5-6) 4/4 Elbow extension (triceps, radial n, C7) 2-/2- Wrist Extension 3+/3+ Wrist Flexion   LOWER EXTREMITY MMT:    MMT Right 01/11/22 Left 01/11/22  Hip flexion 2- 2-  Hip extension 2- 2-  Hip abduction 2- 2-  Hip adduction 2- 3  Hip internal rotation 2- 2-  Hip  external rotation 2- 2-  Knee flexion 2- 2-  Knee extension 2+ 3+  Ankle dorsiflexion 2- NT - boot  Ankle plantarflexion 2- NT- boot  Ankle inversion 2- NT- boot  Ankle eversion 2- NT- boot  (Blank rows = not tested)  BED MOBILITY:  Unable to test due to NWB status of BLE- Patient currently dependent with all bed mobility using hoyer and caregiver assist  TRANSFERS: Currently BLE NWB- Dependent on use of hoyer for all transfers  FUNCTIONAL TESTs:   PATIENT SURVEYS:  FOTO 12  TODAY'S TREATMENT:     THERE.EX:  AAROM- B  hip flex 2 sets x 12 reps AROM- LAQ- each LE 2 sets of 15 reps (Right up as high as possible)  Eccentric LAQ x 10 reps each LE. B hip ER/IR  with RTB with leg straight 2 sets of 10 reps B hip abd/add (resistive with RTB) x 10 reps Manual resistive Leg press 2 sets of 10 reps    B ham curl (RTB)  - 2 sets of 15 reps   Resistive Hip add with RTB - 15 reps each LE  Pt educated throughout session about proper posture and technique with exercises. Improved exercise technique, movement at target joints, use of target muscles after min to mod verbal, visual, tactile cues.   PATIENT EDUCATION: Education details: PT plan of care/use of resistive bands Person educated: Patient Education method: Explanation, Demonstration, Tactile cues, and Verbal cues Education comprehension: verbalized understanding, returned demonstration, verbal cues required, tactile cues required, and needs further education   HOME EXERCISE PROGRAM: No changes at this time  Access Code: [facility] URL: [email or web address] Date: 11/23/2021 Prepared by: Janna Arch Exercises - Seated Gluteal Sets - 2 x daily - 7 x weekly - 2 sets - 10 reps - 5 hold - Seated Quad Set - 2 x daily - 7 x weekly - 2 sets - 10 reps - 5 hold - Seated Long Arc Quad - 2 x daily - 7 x weekly - 2 sets - 10 reps - 5 hold - Seated March - 2 x daily - 7 x weekly - 2 sets - 10 reps - 5 hold - Seated Hip Abduction - 2 x daily - 7 x weekly - 2 sets - 10 reps - 5 hold - Seated Shoulder Shrugs - 2 x daily - 7 x weekly - 2 sets - 10 reps - 5 hold - Wheelchair Pressure Relief - 2 x daily - 7 x weekly - 2 sets - 10 reps - 5 hold  GOALS: Goals reviewed with patient? Yes  SHORT TERM GOALS: Target date: 02/22/2022  Pt will be independent with initial UE strengthening HEP in order to improve strength and balance in order to decrease fall risk and improve function at home and work. Baseline: 10/19/2021- patient with no formal UE HEP; 12/02/2021= Patient  verbalized knowledge of HEP including use of theraband for UE strengthening.  Goal status: GOAL MET  LONG TERM GOALS: Target date: 06/30/2022  Pt will be independent with final for UE/LE HEP in order to improve strength and balance in order to decrease fall risk and improve function at home and work. Baseline: Patient is currently BLE NWB and unable to participate in HEP. Has order for UE strengthening. 01/11/2022- Patient now able to participate in LE strengthening although NWB still- good understanding for some basic exercises. Will keep goal active to incorporate progressive LE strengthening exercises. 04/07/2022= Patient still participating in progressive LE seated HEP and has no  questions at this time.  Goal status: MET  2.  Pt will improve FOTO to target score of 35  to display perceived improvements in ability to complete ADL's.  Baseline: 10/19/2021= 12; 12/02/2021= 17; 01/11/2022=17; 04/07/2022= 17 Goal status: Ongoing  3.  Pt will increase strength of B UE  by at least 1/2 MMT grade in order to demonstrate improvement in strength and function  Baseline: patient has range of 2-/5 to 4/5 BUE Strength; 12/02/2021= 4/5 except with wrist ext. 01/11/2022= 4/5 B UE strength expept for wrist Goal status: Partially met  4. Pt. Will increase strength of RLE by at least 1/2 MMT grade in order to demonstrate improvement in Standing/transfers.  Baseline: 2-/5 Right hip flex/knee ext/flex; 04/07/2022= 2- with right hip flex/knee ext (lacking 28 deg from zero)   Goal status: ONGOING- able to extend right knee more-closer to full ROM  5. Pt. Will demo ability to stand pivot transfer with max assist for improved functional mobility and less dependent need on mechanical device.   Baseline: Dependent on hoyer lift for all transfers; 04/07/2022- Unable to test secondary to patient with recent UTI and right hand procedure - will attempt to reassess next visit. ; 04/12/22: Unable to successfully stand due to feet  plates on loaned power w/c  Goal status: ONGOING  6. Pt. Will demonstrate improved functional LE strength as seen by ability to stand > 2 min for improved transfer ability and pregait abilities.   Baseline: not assessed today due to recent dx: UTI; 04/07/2022- Unable to test secondary to patient with recent UTI and right hand procedure - will attempt to reassess next visit; 04/12/22: Unable to successfully stand due to feet plates on loaned power w/c  Goal status: ONGOING  ASSESSMENT:  CLINICAL IMPRESSION:  Patient able to progress today with improved eccentric control of right LE and performed well with red theraband resistance today. She was fatigued yet performed well overall with each LE- able to follow all VC to perform therex correctly. Pt will continue to benefit from skilled PT services to optimize independence and reduced caregiver support.    OBJECTIVE IMPAIRMENTS Abnormal gait, decreased activity tolerance, decreased balance, decreased coordination, decreased endurance, decreased mobility, difficulty walking, decreased ROM, decreased strength, hypomobility, impaired flexibility, impaired UE functional use, postural dysfunction, and pain.   ACTIVITY LIMITATIONS carrying, lifting, bending, sitting, standing, squatting, sleeping, stairs, transfers, bed mobility, continence, bathing, toileting, dressing, self feeding, reach over head, hygiene/grooming, and caring for others  PARTICIPATION LIMITATIONS: meal prep, cleaning, laundry, medication management, personal finances, interpersonal relationship, driving, shopping, community activity, and yard work  PERSONAL FACTORS Age, Time since onset of injury/illness/exacerbation, and 1-2 comorbidities: HTN, cervical Sx  are also affecting patient's functional outcome.   REHAB POTENTIAL: Good  CLINICAL DECISION MAKING: Evolving/moderate complexity  EVALUATION COMPLEXITY: Moderate  PLAN: PT FREQUENCY: 1-2x/week  PT DURATION: 12  weeks  PLANNED INTERVENTIONS: Therapeutic exercises, Therapeutic activity, Neuromuscular re-education, Balance training, Gait training, Patient/Family education, Self Care, Joint mobilization, DME instructions, Dry Needling, Electrical stimulation, Wheelchair mobility training, Spinal mobilization, Cryotherapy, Moist heat, and Manual therapy  PLAN FOR NEXT SESSION: Continue with weight bearing activities and LE strengthening as appropriate.   Ollen Bowl, PT Physical Therapist- [facility]  05/03/2022, 3:39 PM

## 2022-05-03 NOTE — Therapy (Addendum)
Occupational Therapy Treatment Note      Patient Name: Sherry [name] MRN: [id number] DOB:[birth year], 88 y.o., female Today's Date: 05/03/2022                                         PCP: Dr. Viviana Simpler REFERRING PROVIDER: Dr. Viviana Simpler   OT End of Session - 05/03/22 1638     Visit Number [id number]    Number of Visits 19    Date for OT Re-Evaluation 06/30/22    Authorization Time Period Progress report period starting 12/14/2021    OT Start Time 55    OT Stop Time 1145    OT Time Calculation (min) 45 min    Activity Tolerance Patient tolerated treatment well    Behavior During Therapy [facility] for tasks assessed/performed                  Past Medical History:  Diagnosis Date   Acute blood loss anemia    Arthritis    Cancer    skin   Central cord syndrome at C6 level of cervical spinal cord 11/29/2017   Hypertension    Protein-calorie malnutrition, severe 01/24/2018   S/P insertion of IVC (inferior vena caval) filter 01/24/2018   Tetraparesis    Past Surgical History:  Procedure Laterality Date   ANTERIOR CERVICAL DECOMP/DISCECTOMY FUSION N/A 11/29/2017   Procedure: Cervical five-six, six-seven Anterior Cervical Decompression Fusion;  Surgeon: Judith Part, MD;  Location: [facility];  Service: Neurosurgery;  Laterality: N/A;  Cervical five-six, six-seven Anterior Cervical Decompression Fusion   CATARACT EXTRACTION     FEMUR IM NAIL Left 08/16/2021   Procedure: INTRAMEDULLARY (IM) NAIL FEMORAL;  Surgeon: Earnestine Leys, MD;  Location: [facility];  Service: Orthopedics;  Laterality: Left;   IR IVC FILTER PLMT / S&I /IMG GUID/MOD SED  12/08/2017   Patient Active Problem List   Diagnosis Date Noted   Hypertensive heart disease with other congestive heart failure 02/14/2022   Chronic indwelling Foley catheter 02/14/2022   Closed hip fracture 08/15/2021   DVT (deep venous thrombosis) 08/15/2021   Quadriplegia 08/15/2021   Fall 08/15/2021   Constipation due to slow  transit 08/31/2018   Trauma 06/05/2018   Neuropathic pain 06/05/2018   Neurogenic bowel 06/05/2018   Vaginal yeast infection 01/30/2018   Healthcare-associated pneumonia 01/25/2018   Chronic allergic rhinitis 01/24/2018   Depression with anxiety 01/24/2018   UTI due to Klebsiella species 01/24/2018   Protein-calorie malnutrition, severe 01/24/2018   S/P insertion of IVC (inferior vena caval) filter 01/24/2018   Tetraparesis 01/20/2018   Neurogenic bladder 01/20/2018   Reactive depression    Benign essential HTN    Acute postoperative anemia due to expected blood loss    Central cord syndrome at C6 level of cervical spinal cord 11/29/2017   Allergy to alpha-gal 11/25/2016   SCC (squamous cell carcinoma) 04/08/2014    REFERRING DIAG: Central Cord Syndrome at C6 of the Cervical Spinal Cord, Fall with Bilateral Closed Hip Fractures, with ORIF repair with Intramedullary Nailing of the Left Hip Fracture.  THERAPY DIAG:  Muscle weakness (generalized)  Rationale for Evaluation and Treatment Rehabilitation  PERTINENT HISTORY: Patient s/p fall November 29, 2017 resulting in diagnosis of central cord syndrome at C 6 level.  She has had therapy in multiple venues but with recent move to [facility], therapy staff recommended she seek outpatient therapy  for her needs. Pt. Had a recent fall with Bilateral Closed Hip Fractures, with ORIF repair with Intramedullary Nailing of the Left Hip Fracture.   PRECAUTIONS: None  SUBJECTIVE:   Pt. reports doing well today.  PAIN:  Are you having pain? No     OBJECTIVE:   Manual Therapy:   Pt. tolerated soft tissue massage to the volar, and dorsal surface of each digit on the bilateral hands, and thenar eminence secondary to stiffness following moist heat modality. Soft tissue mobilization was performed for Carpal, and metacarpal spread stretches for the left hand only.(Dermatological surgical site at the dorsal of the right hand still healing with a  bandage present). Manual therapy was performed independent of, and in preparation for therapeutic Ex.   Therapeutic Ex.    Pt. tolerated PROM followed by AROM bilateral wrist extension, PROM for bilateral digit MP, PIP, and DIP flexion, and extension, thumb radial, and palmar abduction.   Pt. reports that her left arm is feeling better overall, however continues to present with hematomas/area of swelling at the left shoulder, and elbow.   Proximal UE exercises are being withheld at this time. Pt. tolerates distal bilateral wrist, and digit  ROM well. Pt. continues to present with tightness at the bilateral thumb webspace, and bilateral wrist flexor tightness, as well as digit MP, PIP, and DIP extensor tightness which continues to limit her ability to complete ADL tasks efficiently. Pt. continues to benefit from working on impoving ROM in order to work towards increasing bilateral hand grasp on objects, and increase engagement of her bilateral hands during ADLs, and IADL tasks.   PATIENT EDUCATION: RUE Distal ROM, and strength Person educated: Patient Education method: Explanation and Demonstration Education comprehension: verbalized understanding, returned demonstration, and needs further education   HOME EXERCISE PROGRAM Continue with ongoing HEPs  Measurements:  12/09/2021:  Shoulder flexion: R: 120(136), L: 138(150) Shoulder abduction: R: 108(120), L: 110(120) Elbow: R: 0-148, L: 0-146 Wrist flexion: R: 55(62), L: 78 Writ extension: R: 34(55), L: 3(4) RD: R: 12(20), L: 16(26) UD: R: 8(24), L: 6(18) Thumb radial abduction: R: 11(20), L: 8(20) Digit flexion to the [facility]: R:  2nd: 5cm(3.5cm), 3rd: 7.5 cm(5cm), 4th: 7cm(3cm), 5th: 6cm(5.5cm) L: 2nd: 8cm(8cm), 3rd: 8cm(8cm), 4th: 7.5cm(7cm), 5th: 7cm(7cm)  02/03/2022:   Shoulder flexion: R: 122(138), L: 148(154) Shoulder abduction: R: 109(125), L: 125(125 Elbow: R: 0-148, L: 0-146 Wrist flexion: R: 60(68), L: 79 Writ extension: R:  40(55), L: 3(10) RD: R: 14(24), L: 20(28) UD: R: 8(24), L: 6(18) Thumb radial abduction: R: 20(25), L: 8(20) Digit flexion to the [facility]: R:  2nd: 4.5cm(3cm), 3rd: 6.5cm(4.5cm), 4th: 6.5 cm(3cm), 5th: 4.5cm(5cm) L: 2nd: 8cm(8cm), 3rd: 8cm(7cm), 4th: 7.5cm(7cm), 5th: 6.5cm(6.5cm)   04/07/2022:   Shoulder flexion: R: 125(150), L: 160(160) Shoulder abduction: R: 109(125), L: 125(125 Elbow: R: 0-148, L: 0-146 Wrist flexion: R: 60(68), L: 79 Writ extension: R: 40(55), L: 5(18) RD: R: 14(24), L: 20(28) UD: R: 18(24), L: 8(18) Thumb radial abduction: R: 20(25), L: 8(20) Digit flexion to the [facility]: R:  2nd: 4 cm(4cm), 3rd: 6.5cm(4cm), 4th: 7 cm (5cm), 5th: 6cm(5cm) L: 2nd: 8cm(8cm), 3rd: 8cm(7cm), 4th: 7.5cm(7cm), 5th: 7cm(7cm)           OT Long Term Goals - 07/06/21 1106  Target Date: 06/30/2022    OT LONG TERM GOAL #1   Title Patient and caregiver will demonstrate understanding of home exercise program for ROM.    Baseline 12/09/2021: Pt. Reports the restorative ROM program has not resumed at the facility. 10/28/2021: Pt. Is attempting exercises/stretches at home. The facility restorative aides have not resumed exercises. 09/14/2021:  Pt. Reports inconsistencies with restorative ROM at  her care home. Pt/caregiver education to be provided about ongoing ROM needs.    Time 12    Period Weeks    Status Deferred     OT LONG TERM GOAL #2   Title Pt.  will improve BUE strength to be able to sustain her UE's in elevation to efficiently, and effectively perform hair care, and self-grooming tasks.   Baseline 02/03/2022: Pt. has improved, and is now able to sustain her Bilateral UEs in elevation long enough to efficiently, and effectively perform haircare, and self-grooming tasks. 12/09/2021: right shoulder 4-/5, left shoulder 4/5. Pt. has improved to be able to reach up for hair care, and self-grooming tasks.  10/28/2021: Right shoulder 3+/5, Left shoulder 4-/5 09/14/2021:  BUE shoulder strength: 3+/5 for shoulder flexion, and abduction.   Time 12    Period Weeks    Status Achieved               OT LONG TERM GOAL #3   Title Patient will demonstrate improved composite finger flexion to hold deodorant to apply to underarms.    Baseline 04/07/2022: Pt. Has improved with right 2nd, and 3rd digit flexion towards the [facility]. Pt. Continues to have difficulty securely holding and applying deodorant.02/03/2022: Pt. Has improved with digit flexion, however continues to have difficulty securely holding and using deoderant. 12/09/2021: Bilateral hand/digit MP, PIP, and DIP extension tightness limits her ability achieve digit flexion to hold, and apply deodorant. 10/28/2021:  Pt. Continues to have difficulty holding the deodorant. 01/12/2021: Pt. Presents with limited digit extension. Pt. Is able to initiate holding deodorant, however is unable to hold it while using it.   Time 12    Period Weeks    Status Ongoing   Target Date                  OT LONG TERM GOAL #4   Title Pt. Will improve bilateral wrist extension in preparation for anticipating, and initiating reaching for objects at the table.    Baseline 04/07/2022: Rght: 40(55) Left: 5(18) 02/03/2022: Right: 34(55) Left: 3(4) 12/09/2021: Right  34(55)  Left  3(4) 10/28/2021:  Right 22(38), Left 0(15) 09/14/2021:  Right: 17(35), left 2(15)   Time 12   Period weeks   Status Ongoing   Target Date      OT LONG TERM GOAL #5   Title Pt. will write, and sign her name with 100% legibility, and modified independence.    Baseline 04/07/2022: Pt. Reports that she continues to present with shaky appearing writing. Pt.reports that she is writing more when filling out her menu, and complete puzzles 02/03/2022: Pt. Continues to report legibility appears "shaky" when attempting signing her name. Pt.reports that she is writing more when filling out her menu, and complete  puzzles.12/09/2021: Pt. Reports legibility appears "shaky" when attempting signing her name. Pt.reports that she is writing more when filling out her menu. 10/28/2021: pt. is able to sign her name, however legibility continues to be inconsistent. Pt. Reports "shaky appearance". 09/14/2021: Pt. continues to fill out her daily menu and complete puzzles. Pt. Continues to present with  limited writing legibility.   Time 12    Period Weeks    Status  Ongoing         OT LONG TERM GOAL  #6   TITLE Pt. will turn pages in a book with modified independence.    Baseline 02/03/2022: Pt. Is able to effectively turn pages of a book. 10/28/2021: Pt. Is able to consistently turn book pages. 09/14/2021: Pt. has difficulty turning pages.    Time 12    Period Weeks    Status Achieved       OT LONG TERM GOAL  #7  TITLE Pt. Will increase  bilateral lateral pinch strength by 2 lbs to be able to securely grasp items during ADLs, and IADL tasks.   Baseline 04/07/2022: NT-Pinch meter out for calibration. 02/03/2022: Right: 6#, Left: 4# 12/09/2021:  Right: 6#, Left: 4#   Time 12  Period Weeks  Status Ongoing           OT LONG TERM GOAL  #8  TITLE Pt. Will button a sweater with large 1" buttons, and zip her jacket with modified independence.   Baseline 04/07/2022: Pt. Has difficulty manipulating buttons, however was introduced to a buttonhook. Pt. Was able to hold, and use a buttonhook for buttons on a shirt. 02/03/2022: Pt. is unable to manipulate buttons on her sweater, or zippers on her jacket.  Time 12  Period Weeks  Status Ongoing   OT LONG TERM GOAL  #9  TITLE Pt. will complete plant care with modified independence.  Baseline 04/07/2022: Pt. is now able to hold a cup and water her plants.02/03/2022: Pt. has difficulty caring for her plants.   Time 12  Period Weeks  Status Ongoing   OT LONG TERM GOAL  #10  TITLE Pt. will demonstrate adaptive techniques to assist with the efficiency of self-dressing, or  morning care tasks.  Baseline 04/07/2022: Pt. Continues to require assist with the efficiency of self-dressing,a nd morning care tasks.02/03/2022: Pt. requires assist from caregivers 2/2 time limitations during morning care.   Time 12  Period Weeks  Status Ongoing     Plan - 07/22/21 1737     Clinical Impression Statement  Pt. reports that her left arm is feeling better overall, however continues to present with hematomas/area of swelling at the left shoulder, and elbow. Proximal UE exercises are being withheld at this time. Pt. tolerates distal bilateral wrist, and digit  ROM well. Pt. continues to present with tightness at the bilateral thumb webspace, and bilateral wrist flexor tightness, as well as digit MP, PIP, and DIP extensor tightness which continues to limit her ability to complete ADL tasks efficiently. Pt. continues to benefit from working on impoving ROM in order to work towards increasing bilateral hand grasp on objects, and increase engagement of her bilateral hands during ADLs, and IADL tasks.                        OT Occupational Profile and History Detailed Assessment- Review of Records and additional review of physical, cognitive, psychosocial history related to current functional performance    Occupational performance deficits (Please refer to evaluation for details): ADL's;IADL's;Leisure;Social Participation    Body Structure / Function / Physical Skills ADL;Continence;Dexterity;Flexibility;Strength;ROM;Balance;Coordination;FMC;IADL;Endurance;UE functional use;Decreased knowledge of use of DME;GMC    Psychosocial Skills Coping Strategies    Rehab Potential Fair    Clinical Decision Making Several treatment options, min-mod task modification necessary    Comorbidities Affecting Occupational Performance: Presence of  comorbidities impacting occupational performance    Comorbidities impacting occupational performance description: contractures of bilateral hands, dependent  transfers,    Modification or Assistance to Complete Evaluation  No modification of tasks or assist necessary to complete eval    OT Frequency 2x / week    OT Duration 12 weeks    OT Treatment/Interventions Self-care/ADL training;Cryotherapy;Therapeutic exercise;DME and/or AE instruction;Balance training;Neuromuscular education;Manual Therapy;Splinting;Moist Heat;Passive range of motion;Therapeutic activities;Patient/family education    Plan continue to progress ROM of digits, wrists and shoulders as it pertains to completion of ADL tasks that pt values.    Consulted and Agree with Plan of Care Patient            Harrel Carina, MS, OTR/L  Harrel Carina, OT 05/03/2022, 4:42 PM      `

## 2022-05-05 ENCOUNTER — Ambulatory Visit: Payer: Medicare HMO

## 2022-05-05 ENCOUNTER — Ambulatory Visit: Payer: Medicare HMO | Admitting: Occupational Therapy

## 2022-05-05 DIAGNOSIS — M6281 Muscle weakness (generalized): Secondary | ICD-10-CM | POA: Diagnosis not present

## 2022-05-05 DIAGNOSIS — R269 Unspecified abnormalities of gait and mobility: Secondary | ICD-10-CM

## 2022-05-05 DIAGNOSIS — R278 Other lack of coordination: Secondary | ICD-10-CM

## 2022-05-05 DIAGNOSIS — R2689 Other abnormalities of gait and mobility: Secondary | ICD-10-CM

## 2022-05-05 DIAGNOSIS — R262 Difficulty in walking, not elsewhere classified: Secondary | ICD-10-CM

## 2022-05-05 NOTE — Therapy (Signed)
OUTPATIENT PHYSICAL THERAPY NEURO TREATMENT  Patient Name: Sherry [name] MRN: [id number] DOB:[birth year], 88 y.o., female Today's Date: 05/03/2022   PCP: Dewayne Shorter, MD REFERRING PROVIDER: Earnestine Leys   PT End of Session - 05/03/22 1253     Visit Number 38    Number of Visits 47    Date for PT Re-Evaluation 06/30/22    Authorization Time Period Initial cert [id number]; Recert [id number]; Recert [id number]    Progress Note Due on Visit 52    PT Start Time 1147    PT Stop Time 1222    PT Time Calculation (min) 35 min    Equipment Utilized During Treatment Gait belt    Activity Tolerance Patient tolerated treatment well    Behavior During Therapy WFL for tasks assessed/performed                           Past Medical History:  Diagnosis Date   Acute blood loss anemia    Arthritis    Cancer    skin   Central cord syndrome at C6 level of cervical spinal cord 11/29/2017   Hypertension    Protein-calorie malnutrition, severe 01/24/2018   S/P insertion of IVC (inferior vena caval) filter 01/24/2018   Tetraparesis    Past Surgical History:  Procedure Laterality Date   ANTERIOR CERVICAL DECOMP/DISCECTOMY FUSION N/A 11/29/2017   Procedure: Cervical five-six, six-seven Anterior Cervical Decompression Fusion;  Surgeon: Judith Part, MD;  Location: [place];  Service: Neurosurgery;  Laterality: N/A;  Cervical five-six, six-seven Anterior Cervical Decompression Fusion   CATARACT EXTRACTION     FEMUR IM NAIL Left 08/16/2021   Procedure: INTRAMEDULLARY (IM) NAIL FEMORAL;  Surgeon: Earnestine Leys, MD;  Location: [facility];  Service: Orthopedics;  Laterality: Left;   IR IVC FILTER PLMT / S&I /IMG GUID/MOD SED  12/08/2017   Patient Active Problem List   Diagnosis Date Noted   Hypertensive heart disease with other congestive heart failure 02/14/2022   Chronic indwelling Foley catheter 02/14/2022   Closed hip fracture 08/15/2021    DVT (deep venous thrombosis) 08/15/2021   Quadriplegia 08/15/2021   Fall 08/15/2021   Constipation due to slow transit 08/31/2018   Trauma 06/05/2018   Neuropathic pain 06/05/2018   Neurogenic bowel 06/05/2018   Vaginal yeast infection 01/30/2018   Healthcare-associated pneumonia 01/25/2018   Chronic allergic rhinitis 01/24/2018   Depression with anxiety 01/24/2018   UTI due to Klebsiella species 01/24/2018   Protein-calorie malnutrition, severe 01/24/2018   S/P insertion of IVC (inferior vena caval) filter 01/24/2018   Tetraparesis 01/20/2018   Neurogenic bladder 01/20/2018   Reactive depression    Benign essential HTN    Acute postoperative anemia due to expected blood loss    Central cord syndrome at C6 level of cervical spinal cord 11/29/2017   Allergy to alpha-gal 11/25/2016   SCC (squamous cell carcinoma) 04/08/2014    ONSET DATE: 08/15/2021 (fall with B hip fx); Initial injury was in 2019- quadraplegia due to central cord syndrome of C6 secondary to neck fracture.   REFERRING DIAG: Bilateral Hip fx; Left Open- s/p Left IM nail femoral on 08/16/2021; Right closed  THERAPY DIAG:  Muscle weakness (generalized)  Other lack of coordination  Abnormality of gait and mobility  Difficulty in walking, not elsewhere classified  Other abnormalities of gait and mobility  Central cord syndrome, sequela  Closed fracture of both hips, initial encounter  Rationale for Evaluation and Treatment Rehabilitation  SUBJECTIVE:  SUBJECTIVE STATEMENT:    Patient report continuing to recover and doing well overall with no new issues.   Pt accompanied by:  Paid caregiver-   PERTINENT HISTORY: Patient was previous patient (see previous episode for specifics) She experienced a fall at home on 08/15/2021 and was in [facility] from 08/15/2021- 08/20/2021.  Most recent history per Dr. Laurey Arrow on 08/20/2021.  Michaeleen A Sayarath is a 88 y.o. female with medical history significant of  quadriplegia due to central cord syndrome of C6 secondary to neck fracture, hypertension, depression with anxiety, neurogenic bladder, bilateral DVT on Xarelto (s/p of IVC filter placement), who presents with fall and hip pain.   Patient has history of quadriplegia, and has been doing PT/OT with improvement of function. Pt has some use of both arms and can feed herself. She can walk a little bit with assistance. Pt accidentally fell at about 930 this morning after taking shower.  She injured her hips. Hospital Course:    Closed left hip fracture [facility]) s/p mechanical fall Closed right hip fracture (?old) Acute blood loss anemia --pt is s/p  INTRAMEDULLARY (IM) NAIL FEMORAL-left on 7/16  PAIN:  Are you having pain? NO: NPRS scale: 0/10 Pain location: Right hip Pain description: Ache Aggravating factors: any active Movement of right side Relieving factors: Rest  PRECAUTIONS: Fall  WEIGHT BEARING RESTRICTIONS Yes Bilateral non-weight bearing B LE's  FALLS: Has patient fallen in last 6 months? Yes. Number of falls 1  LIVING ENVIRONMENT: Lives with: lives with their family and lives in a nursing home Lives in: Other Long term care at [facility]: No Has following equipment at home: Wheelchair (power), Ramped entry, and hoyer lift  PLOF: Needs assistance with ADLs, Needs assistance with homemaking, Needs assistance with gait, Needs assistance with transfers, and Leisure: Work puzzles, read, Watch TV  PATIENT GOALS Patient goal is to walk; Be able to strengthen my arms for better use  OBJECTIVE:   DIAGNOSTIC FINDINGS: Overall cognitive statusEXAM: DG HIP (WITH OR WITHOUT PELVIS) 2-3V LEFT; DG HIP (WITH OR WITHOUT PELVIS) 2-3V RIGHT   COMPARISON:  CT of the abdomen and pelvis 01/21/2020   FINDINGS: Left proximal femur fracture noted just below the intertrochanteric region. Marked ventral angulation noted on the cross-table image. Slight varus angulation present   Comminuted  intratrochanteric fracture present on the right also with marked ventral angulation.   IMPRESSION: 1. Bilateral hip fractures. 2. Comminuted intertrochanteric fracture of the right hip. 3. Angulated fracture of the proximal left femur just below the intertrochanteric region.     Electronically Signed   By: San Morelle M.D.   On: 08/15/2021 11:54:   COGNITION:  Within functional limits for tasks assessed   SENSATION: Light touch: Impaired   EDEMA:  None  Strength R/L 2-/2- Shoulder flexion (anterior deltoid/pec major/coracobrachialis, axillary n. (C5-6) and musculocutaneous n. (C5-7)) 2-/3+ (within available ROM around 130 on lef)Shoulder abduction (deltoid/supraspinatus, axillary/suprascapular n, C5) 3+/3+ Shoulder external rotation (infraspinatus/teres minor) 4/4 Shoulder internal rotation (subcapularis/lats/pec major) 3+/3+ Shoulder extension (posterior deltoid, lats, teres major, axillary/thoracodorsal n.) 3+/3+ Shoulder horizontal abduction 4/4 Elbow flexion (biceps brachii, brachialis, brachioradialis, musculoskeletal n, C5-6) 4/4 Elbow extension (triceps, radial n, C7) 2-/2- Wrist Extension 3+/3+ Wrist Flexion   LOWER EXTREMITY MMT:    MMT Right 01/11/22 Left 01/11/22  Hip flexion 2- 2-  Hip extension 2- 2-  Hip abduction 2- 2-  Hip adduction 2- 3  Hip internal rotation 2- 2-  Hip external rotation 2- 2-  Knee  flexion 2- 2-  Knee extension 2+ 3+  Ankle dorsiflexion 2- NT - boot  Ankle plantarflexion 2- NT- boot  Ankle inversion 2- NT- boot  Ankle eversion 2- NT- boot  (Blank rows = not tested)  BED MOBILITY:  Unable to test due to NWB status of BLE- Patient currently dependent with all bed mobility using hoyer and caregiver assist  TRANSFERS: Currently BLE NWB- Dependent on use of hoyer for all transfers  FUNCTIONAL TESTs:   PATIENT SURVEYS:  FOTO 12  TODAY'S TREATMENT:     THERE.EX:  PROM to bilateral knee flex/ext AAROM- B  hip flex 2 sets x 12 reps (less overall assist with right hip today) AROM- LAQ- each LE 2 sets of 15 reps (Right up as high as possible- near Full ROM)  B hip ER/IR  with RTB with leg straight 1 set of 10 reps B hip abd/add (resistive with RTB) x 10 reps Manual resistive Leg press 2 sets of 10 reps    B ham curl (RTB)  - 2 sets of 15 reps   Resistive Hip add with RTB - 15 reps each LE  Pt educated throughout session about proper posture and technique with exercises. Improved exercise technique, movement at target joints, use of target muscles after min to mod verbal, visual, tactile cues.   PATIENT EDUCATION: Education details: PT plan of care/use of resistive bands Person educated: Patient Education method: Explanation, Demonstration, Tactile cues, and Verbal cues Education comprehension: verbalized understanding, returned demonstration, verbal cues required, tactile cues required, and needs further education   HOME EXERCISE PROGRAM: No changes at this time  Access Code: [facility] URL: [email or web address] Date: 11/23/2021 Prepared by: Janna Arch Exercises - Seated Gluteal Sets - 2 x daily - 7 x weekly - 2 sets - 10 reps - 5 hold - Seated Quad Set - 2 x daily - 7 x weekly - 2 sets - 10 reps - 5 hold - Seated Long Arc Quad - 2 x daily - 7 x weekly - 2 sets - 10 reps - 5 hold - Seated March - 2 x daily - 7 x weekly - 2 sets - 10 reps - 5 hold - Seated Hip Abduction - 2 x daily - 7 x weekly - 2 sets - 10 reps - 5 hold - Seated Shoulder Shrugs - 2 x daily - 7 x weekly - 2 sets - 10 reps - 5 hold - Wheelchair Pressure Relief - 2 x daily - 7 x weekly - 2 sets - 10 reps - 5 hold  GOALS: Goals reviewed with patient? Yes  SHORT TERM GOALS: Target date: 02/22/2022  Pt will be independent with initial UE strengthening HEP in order to improve strength and balance in order to decrease fall risk and improve function at home and work. Baseline: 10/19/2021- patient with no formal UE HEP;  12/02/2021= Patient verbalized knowledge of HEP including use of theraband for UE strengthening.  Goal status: GOAL MET  LONG TERM GOALS: Target date: 06/30/2022  Pt will be independent with final for UE/LE HEP in order to improve strength and balance in order to decrease fall risk and improve function at home and work. Baseline: Patient is currently BLE NWB and unable to participate in HEP. Has order for UE strengthening. 01/11/2022- Patient now able to participate in LE strengthening although NWB still- good understanding for some basic exercises. Will keep goal active to incorporate progressive LE strengthening exercises. 04/07/2022= Patient still participating in progressive LE seated HEP and  has no questions at this time.  Goal status: MET  2.  Pt will improve FOTO to target score of 35  to display perceived improvements in ability to complete ADL's.  Baseline: 10/19/2021= 12; 12/02/2021= 17; 01/11/2022=17; 04/07/2022= 17 Goal status: Ongoing  3.  Pt will increase strength of B UE  by at least 1/2 MMT grade in order to demonstrate improvement in strength and function  Baseline: patient has range of 2-/5 to 4/5 BUE Strength; 12/02/2021= 4/5 except with wrist ext. 01/11/2022= 4/5 B UE strength expept for wrist Goal status: Partially met  4. Pt. Will increase strength of RLE by at least 1/2 MMT grade in order to demonstrate improvement in Standing/transfers.  Baseline: 2-/5 Right hip flex/knee ext/flex; 04/07/2022= 2- with right hip flex/knee ext (lacking 28 deg from zero)   Goal status: ONGOING- able to extend right knee more-closer to full ROM  5. Pt. Will demo ability to stand pivot transfer with max assist for improved functional mobility and less dependent need on mechanical device.   Baseline: Dependent on hoyer lift for all transfers; 04/07/2022- Unable to test secondary to patient with recent UTI and right hand procedure - will attempt to reassess next visit. ; 04/12/22: Unable to successfully  stand due to feet plates on loaned power w/c  Goal status: ONGOING  6. Pt. Will demonstrate improved functional LE strength as seen by ability to stand > 2 min for improved transfer ability and pregait abilities.   Baseline: not assessed today due to recent dx: UTI; 04/07/2022- Unable to test secondary to patient with recent UTI and right hand procedure - will attempt to reassess next visit; 04/12/22: Unable to successfully stand due to feet plates on loaned power w/c  Goal status: ONGOING  ASSESSMENT:  CLINICAL IMPRESSION:  Patient reported more fatigue at beginning of session but actually demonstrating good response- less overall assist with right LE- still minimal hip abd with either leg but much improved hip flex and near full ROM with right knee ext today. Pt will continue to benefit from skilled PT services to optimize independence and reduced caregiver support.    OBJECTIVE IMPAIRMENTS Abnormal gait, decreased activity tolerance, decreased balance, decreased coordination, decreased endurance, decreased mobility, difficulty walking, decreased ROM, decreased strength, hypomobility, impaired flexibility, impaired UE functional use, postural dysfunction, and pain.   ACTIVITY LIMITATIONS carrying, lifting, bending, sitting, standing, squatting, sleeping, stairs, transfers, bed mobility, continence, bathing, toileting, dressing, self feeding, reach over head, hygiene/grooming, and caring for others  PARTICIPATION LIMITATIONS: meal prep, cleaning, laundry, medication management, personal finances, interpersonal relationship, driving, shopping, community activity, and yard work  PERSONAL FACTORS Age, Time since onset of injury/illness/exacerbation, and 1-2 comorbidities: HTN, cervical Sx  are also affecting patient's functional outcome.   REHAB POTENTIAL: Good  CLINICAL DECISION MAKING: Evolving/moderate complexity  EVALUATION COMPLEXITY: Moderate  PLAN: PT FREQUENCY: 1-2x/week  PT DURATION:  12 weeks  PLANNED INTERVENTIONS: Therapeutic exercises, Therapeutic activity, Neuromuscular re-education, Balance training, Gait training, Patient/Family education, Self Care, Joint mobilization, DME instructions, Dry Needling, Electrical stimulation, Wheelchair mobility training, Spinal mobilization, Cryotherapy, Moist heat, and Manual therapy  PLAN FOR NEXT SESSION: Continue with weight bearing activities and LE strengthening as appropriate.   Ollen Bowl, PT Physical Therapist- [facility]  05/03/2022, 3:39 PM

## 2022-05-05 NOTE — Therapy (Signed)
Occupational Therapy Treatment Note      Patient Name: Sherry [name] MRN: [id number] DOB:08-05-1934, 88 y.o., female Today's Date: 05/05/2022                                         PCP: Dr. Viviana Simpler REFERRING PROVIDER: Dr. Viviana Simpler   OT End of Session - 05/05/22 1556     Visit Number 45    Number of Visits 25    Date for OT Re-Evaluation 06/30/22    Authorization Time Period Progress report period starting 12/14/2021    OT Start Time 20    OT Stop Time 1145    OT Time Calculation (min) 45 min    Activity Tolerance Patient tolerated treatment well    Behavior During Therapy [facility] for tasks assessed/performed                  Past Medical History:  Diagnosis Date   Acute blood loss anemia    Arthritis    Cancer    skin   Central cord syndrome at C6 level of cervical spinal cord 11/29/2017   Hypertension    Protein-calorie malnutrition, severe 01/24/2018   S/P insertion of IVC (inferior vena caval) filter 01/24/2018   Tetraparesis    Past Surgical History:  Procedure Laterality Date   ANTERIOR CERVICAL DECOMP/DISCECTOMY FUSION N/A 11/29/2017   Procedure: Cervical five-six, six-seven Anterior Cervical Decompression Fusion;  Surgeon: Judith Part, MD;  Location: [facility];  Service: Neurosurgery;  Laterality: N/A;  Cervical five-six, six-seven Anterior Cervical Decompression Fusion   CATARACT EXTRACTION     FEMUR IM NAIL Left 08/16/2021   Procedure: INTRAMEDULLARY (IM) NAIL FEMORAL;  Surgeon: Earnestine Leys, MD;  Location: [facility];  Service: Orthopedics;  Laterality: Left;   IR IVC FILTER PLMT / S&I /IMG GUID/MOD SED  12/08/2017   Patient Active Problem List   Diagnosis Date Noted   Hypertensive heart disease with other congestive heart failure 02/14/2022   Chronic indwelling Foley catheter 02/14/2022   Closed hip fracture 08/15/2021   DVT (deep venous thrombosis) 08/15/2021   Quadriplegia 08/15/2021   Fall 08/15/2021   Constipation due to slow  transit 08/31/2018   Trauma 06/05/2018   Neuropathic pain 06/05/2018   Neurogenic bowel 06/05/2018   Vaginal yeast infection 01/30/2018   Healthcare-associated pneumonia 01/25/2018   Chronic allergic rhinitis 01/24/2018   Depression with anxiety 01/24/2018   UTI due to Klebsiella species 01/24/2018   Protein-calorie malnutrition, severe 01/24/2018   S/P insertion of IVC (inferior vena caval) filter 01/24/2018   Tetraparesis 01/20/2018   Neurogenic bladder 01/20/2018   Reactive depression    Benign essential HTN    Acute postoperative anemia due to expected blood loss    Central cord syndrome at C6 level of cervical spinal cord 11/29/2017   Allergy to alpha-gal 11/25/2016   SCC (squamous cell carcinoma) 04/08/2014    REFERRING DIAG: Central Cord Syndrome at C6 of the Cervical Spinal Cord, Fall with Bilateral Closed Hip Fractures, with ORIF repair with Intramedullary Nailing of the Left Hip Fracture.  THERAPY DIAG:  Muscle weakness (generalized)  Other lack of coordination  Rationale for Evaluation and Treatment Rehabilitation  PERTINENT HISTORY: Patient s/p fall November 29, 2017 resulting in diagnosis of central cord syndrome at C 6 level.  She has had therapy in multiple venues but with recent move to [facility], therapy staff  recommended she seek outpatient therapy for her needs. Pt. Had a recent fall with Bilateral Closed Hip Fractures, with ORIF repair with Intramedullary Nailing of the Left Hip Fracture.   PRECAUTIONS: None  SUBJECTIVE:   Pt. reports doing well today.  PAIN:  Are you having pain? No  OBJECTIVE:   Manual Therapy:   Pt. tolerated soft tissue massage to the volar, and dorsal surface of each digit on the bilateral hands, and thenar eminence secondary to stiffness following moist heat modality. Soft tissue mobilization was performed for Carpal, and metacarpal spread stretches for the left hand only.(Dermatological surgical site at the dorsal of the right  hand still healing with a bandage present). Manual therapy was performed independent of, and in preparation for therapeutic Ex.   Therapeutic Ex.    Pt. tolerated PROM followed by AROM bilateral wrist extension, PROM for bilateral digit MP, PIP, and DIP flexion, and extension, thumb radial, and palmar abduction.   Pt. reports that her left arm continues to feel better overall, however continues to present with hematomas/area of swelling at the left shoulder, and elbow. Proximal UE exercises are being withheld at this time. Pt. tolerates distal bilateral wrist, and digit  ROM well. Pt. continues to present with tightness at the bilateral thumb webspace, and bilateral wrist flexor tightness, as well as digit MP, PIP, and DIP extensor tightness which continues to limit her ability to complete ADL tasks efficiently. Pt. continues to benefit from working on impoving ROM in order to work towards increasing bilateral hand grasp on objects, and increase engagement of her bilateral hands during ADLs, and IADL tasks.   PATIENT EDUCATION: RUE Distal ROM, and strength Person educated: Patient Education method: Explanation and Demonstration Education comprehension: verbalized understanding, returned demonstration, and needs further education   HOME EXERCISE PROGRAM Continue with ongoing HEPs  Measurements:  12/09/2021:  Shoulder flexion: R: 120(136), L: 138(150) Shoulder abduction: R: 108(120), L: 110(120) Elbow: R: 0-148, L: 0-146 Wrist flexion: R: 55(62), L: 78 Writ extension: R: 34(55), L: 3(4) RD: R: 12(20), L: 16(26) UD: R: 8(24), L: 6(18) Thumb radial abduction: R: 11(20), L: 8(20) Digit flexion to the [facility]: R:  2nd: 5cm(3.5cm), 3rd: 7.5 cm(5cm), 4th: 7cm(3cm), 5th: 6cm(5.5cm) L: 2nd: 8cm(8cm), 3rd: 8cm(8cm), 4th: 7.5cm(7cm), 5th: 7cm(7cm)  02/03/2022:   Shoulder flexion: R: 122(138), L: 148(154) Shoulder abduction: R: 109(125), L: 125(125 Elbow: R: 0-148, L: 0-146 Wrist flexion: R:  60(68), L: 79 Writ extension: R: 40(55), L: 3(10) RD: R: 14(24), L: 20(28) UD: R: 8(24), L: 6(18) Thumb radial abduction: R: 20(25), L: 8(20) Digit flexion to the [facility]: R:  2nd: 4.5cm(3cm), 3rd: 6.5cm(4.5cm), 4th: 6.5 cm(3cm), 5th: 4.5cm(5cm) L: 2nd: 8cm(8cm), 3rd: 8cm(7cm), 4th: 7.5cm(7cm), 5th: 6.5cm(6.5cm)   04/07/2022:   Shoulder flexion: R: 125(150), L: 160(160) Shoulder abduction: R: 109(125), L: 125(125 Elbow: R: 0-148, L: 0-146 Wrist flexion: R: 60(68), L: 79 Writ extension: R: 40(55), L: 5(18) RD: R: 14(24), L: 20(28) UD: R: 18(24), L: 8(18) Thumb radial abduction: R: 20(25), L: 8(20) Digit flexion to the [facility]: R:  2nd: 4 cm(4cm), 3rd: 6.5cm(4cm), 4th: 7 cm (5cm), 5th: 6cm(5cm) L: 2nd: 8cm(8cm), 3rd: 8cm(7cm), 4th: 7.5cm(7cm), 5th: 7cm(7cm)           OT Long Term Goals - 07/06/21 1106  Target Date: 06/30/2022    OT LONG TERM GOAL #1   Title Patient and caregiver will demonstrate understanding of home exercise program for ROM.    Baseline 12/09/2021: Pt. Reports the restorative ROM program has not resumed at the facility. 10/28/2021: Pt. Is attempting exercises/stretches at home. The facility restorative aides have not resumed exercises. 09/14/2021:  Pt. Reports inconsistencies with restorative ROM at  her care home. Pt/caregiver education to be provided about ongoing ROM needs.    Time 12    Period Weeks    Status Deferred     OT LONG TERM GOAL #2   Title Pt.  will improve BUE strength to be able to sustain her UE's in elevation to efficiently, and effectively perform hair care, and self-grooming tasks.   Baseline 02/03/2022: Pt. has improved, and is now able to sustain her Bilateral UEs in elevation long enough to efficiently, and effectively perform haircare, and self-grooming tasks. 12/09/2021: right shoulder 4-/5, left shoulder 4/5. Pt. has improved to be able to reach up for hair  care, and self-grooming tasks. 10/28/2021: Right shoulder 3+/5, Left shoulder 4-/5 09/14/2021:  BUE shoulder strength: 3+/5 for shoulder flexion, and abduction.   Time 12    Period Weeks    Status Achieved               OT LONG TERM GOAL #3   Title Patient will demonstrate improved composite finger flexion to hold deodorant to apply to underarms.    Baseline 04/07/2022: Pt. Has improved with right 2nd, and 3rd digit flexion towards the [facility]. Pt. Continues to have difficulty securely holding and applying deodorant.02/03/2022: Pt. Has improved with digit flexion, however continues to have difficulty securely holding and using deoderant. 12/09/2021: Bilateral hand/digit MP, PIP, and DIP extension tightness limits her ability achieve digit flexion to hold, and apply deodorant. 10/28/2021:  Pt. Continues to have difficulty holding the deodorant. 01/12/2021: Pt. Presents with limited digit extension. Pt. Is able to initiate holding deodorant, however is unable to hold it while using it.   Time 12    Period Weeks    Status Ongoing   Target Date                  OT LONG TERM GOAL #4   Title Pt. Will improve bilateral wrist extension in preparation for anticipating, and initiating reaching for objects at the table.    Baseline 04/07/2022: Rght: 40(55) Left: 5(18) 02/03/2022: Right: 34(55) Left: 3(4) 12/09/2021: Right  34(55)  Left  3(4) 10/28/2021:  Right 22(38), Left 0(15) 09/14/2021:  Right: 17(35), left 2(15)   Time 12   Period weeks   Status Ongoing   Target Date      OT LONG TERM GOAL #5   Title Pt. will write, and sign her name with 100% legibility, and modified independence.    Baseline 04/07/2022: Pt. Reports that she continues to present with shaky appearing writing. Pt.reports that she is writing more when filling out her menu, and complete puzzles 02/03/2022: Pt. Continues to report legibility appears "shaky" when attempting signing her name. Pt.reports that she is writing more when filling out her  menu, and complete puzzles.12/09/2021: Pt. Reports legibility appears "shaky" when attempting signing her name. Pt.reports that she is writing more when filling out her menu. 10/28/2021: pt. is able to sign her name, however legibility continues to be inconsistent. Pt. Reports "shaky appearance". 09/14/2021: Pt. continues to fill out her daily menu and complete puzzles. Pt. Continues to present with  limited writing legibility.   Time 12    Period Weeks    Status  Ongoing         OT LONG TERM GOAL  #6   TITLE Pt. will turn pages in a book with modified independence.    Baseline 02/03/2022: Pt. Is able to effectively turn pages of a book. 10/28/2021: Pt. Is able to consistently turn book pages. 09/14/2021: Pt. has difficulty turning pages.    Time 12    Period Weeks    Status Achieved       OT LONG TERM GOAL  #7  TITLE Pt. Will increase  bilateral lateral pinch strength by 2 lbs to be able to securely grasp items during ADLs, and IADL tasks.   Baseline 04/07/2022: NT-Pinch meter out for calibration. 02/03/2022: Right: 6#, Left: 4# 12/09/2021:  Right: 6#, Left: 4#   Time 12  Period Weeks  Status Ongoing           OT LONG TERM GOAL  #8  TITLE Pt. Will button a sweater with large 1" buttons, and zip her jacket with modified independence.   Baseline 04/07/2022: Pt. Has difficulty manipulating buttons, however was introduced to a buttonhook. Pt. Was able to hold, and use a buttonhook for buttons on a shirt. 02/03/2022: Pt. is unable to manipulate buttons on her sweater, or zippers on her jacket.  Time 12  Period Weeks  Status Ongoing   OT LONG TERM GOAL  #9  TITLE Pt. will complete plant care with modified independence.  Baseline 04/07/2022: Pt. is now able to hold a cup and water her plants.02/03/2022: Pt. has difficulty caring for her plants.   Time 12  Period Weeks  Status Ongoing   OT LONG TERM GOAL  #10  TITLE Pt. will demonstrate adaptive techniques to assist with the efficiency of  self-dressing, or morning care tasks.  Baseline 04/07/2022: Pt. Continues to require assist with the efficiency of self-dressing,a nd morning care tasks.02/03/2022: Pt. requires assist from caregivers 2/2 time limitations during morning care.   Time 12  Period Weeks  Status Ongoing     Plan - 07/22/21 1737     Clinical Impression Statement  Pt. reports that her left arm continues to feel better overall, however continues to present with hematomas/area of swelling at the left shoulder, and elbow. Proximal UE exercises are being withheld at this time. Pt. tolerates distal bilateral wrist, and digit  ROM well. Pt. continues to present with tightness at the bilateral thumb webspace, and bilateral wrist flexor tightness, as well as digit MP, PIP, and DIP extensor tightness which continues to limit her ability to complete ADL tasks efficiently. Pt. continues to benefit from working on impoving ROM in order to work towards increasing bilateral hand grasp on objects, and increase engagement of her bilateral hands during ADLs, and IADL tasks. .                        OT Occupational Profile and History Detailed Assessment- Review of Records and additional review of physical, cognitive, psychosocial history related to current functional performance    Occupational performance deficits (Please refer to evaluation for details): ADL's;IADL's;Leisure;Social Participation    Body Structure / Function / Physical Skills ADL;Continence;Dexterity;Flexibility;Strength;ROM;Balance;Coordination;FMC;IADL;Endurance;UE functional use;Decreased knowledge of use of DME;G[facility]    Psychosocial Skills Coping Strategies    Rehab Potential Fair    Clinical Decision Making Several treatment options, min-mod task modification necessary    Comorbidities Affecting Occupational Performance:  Presence of comorbidities impacting occupational performance    Comorbidities impacting occupational performance description: contractures of  bilateral hands, dependent transfers,    Modification or Assistance to Complete Evaluation  No modification of tasks or assist necessary to complete eval    OT Frequency 2x / week    OT Duration 12 weeks    OT Treatment/Interventions Self-care/ADL training;Cryotherapy;Therapeutic exercise;DME and/or AE instruction;Balance training;Neuromuscular education;Manual Therapy;Splinting;Moist Heat;Passive range of motion;Therapeutic activities;Patient/family education    Plan continue to progress ROM of digits, wrists and shoulders as it pertains to completion of ADL tasks that pt values.    Consulted and Agree with Plan of Care Patient            Harrel Carina, MS, OTR/L  Harrel Carina, OT 05/05/2022, 3:58 PM      `

## 2022-05-06 ENCOUNTER — Ambulatory Visit: Payer: Medicare HMO | Admitting: Occupational Therapy

## 2022-05-10 ENCOUNTER — Ambulatory Visit: Payer: Medicare HMO

## 2022-05-10 ENCOUNTER — Ambulatory Visit: Payer: Medicare HMO | Admitting: Occupational Therapy

## 2022-05-10 DIAGNOSIS — M6281 Muscle weakness (generalized): Secondary | ICD-10-CM

## 2022-05-10 DIAGNOSIS — R2689 Other abnormalities of gait and mobility: Secondary | ICD-10-CM

## 2022-05-10 DIAGNOSIS — R269 Unspecified abnormalities of gait and mobility: Secondary | ICD-10-CM

## 2022-05-10 DIAGNOSIS — R278 Other lack of coordination: Secondary | ICD-10-CM

## 2022-05-10 DIAGNOSIS — S14129S Central cord syndrome at unspecified level of cervical spinal cord, sequela: Secondary | ICD-10-CM

## 2022-05-10 DIAGNOSIS — R262 Difficulty in walking, not elsewhere classified: Secondary | ICD-10-CM

## 2022-05-10 DIAGNOSIS — S72001A Fracture of unspecified part of neck of right femur, initial encounter for closed fracture: Secondary | ICD-10-CM

## 2022-05-10 LAB — CBC AND DIFFERENTIAL
HCT: 31 — AB (ref 36–46)
Hemoglobin: 10 — AB (ref 12.0–16.0)

## 2022-05-10 NOTE — Therapy (Signed)
Occupational Therapy Treatment Note      Patient Name: Sherry [name] MRN: [id number] DOB:1934/03/18, 88 y.o., female Today's Date: 05/10/2022                                         PCP: Dr. Tillman Abide REFERRING PROVIDER: Dr. Tillman Abide   OT End of Session - 05/10/22 1639     Visit Number [id number]    Number of Visits 72    Date for OT Re-Evaluation 06/30/22    Authorization Time Period Progress report period starting 12/14/2021    OT Start Time 1100    OT Stop Time 1145    OT Time Calculation (min) 45 min    Activity Tolerance Patient tolerated treatment well    Behavior During Therapy [facility] for tasks assessed/performed                  Past Medical History:  Diagnosis Date   Acute blood loss anemia    Arthritis    Cancer    skin   Central cord syndrome at C6 level of cervical spinal cord 11/29/2017   Hypertension    Protein-calorie malnutrition, severe 01/24/2018   S/P insertion of IVC (inferior vena caval) filter 01/24/2018   Tetraparesis    Past Surgical History:  Procedure Laterality Date   ANTERIOR CERVICAL DECOMP/DISCECTOMY FUSION N/A 11/29/2017   Procedure: Cervical five-six, six-seven Anterior Cervical Decompression Fusion;  Surgeon: Jadene Pierini, MD;  Location: [facility] OR;  Service: Neurosurgery;  Laterality: N/A;  Cervical five-six, six-seven Anterior Cervical Decompression Fusion   CATARACT EXTRACTION     FEMUR IM NAIL Left 08/16/2021   Procedure: INTRAMEDULLARY (IM) NAIL FEMORAL;  Surgeon: Deeann Saint, MD;  Location: [facility] ORS;  Service: Orthopedics;  Laterality: Left;   IR IVC FILTER PLMT / S&I /IMG GUID/MOD SED  12/08/2017   Patient Active Problem List   Diagnosis Date Noted   Hypertensive heart disease with other congestive heart failure 02/14/2022   Chronic indwelling Foley catheter 02/14/2022   Closed hip fracture 08/15/2021   DVT (deep venous thrombosis) 08/15/2021   Quadriplegia 08/15/2021   Fall 08/15/2021   Constipation due to slow  transit 08/31/2018   Trauma 06/05/2018   Neuropathic pain 06/05/2018   Neurogenic bowel 06/05/2018   Vaginal yeast infection 01/30/2018   Healthcare-associated pneumonia 01/25/2018   Chronic allergic rhinitis 01/24/2018   Depression with anxiety 01/24/2018   UTI due to Klebsiella species 01/24/2018   Protein-calorie malnutrition, severe 01/24/2018   S/P insertion of IVC (inferior vena caval) filter 01/24/2018   Tetraparesis 01/20/2018   Neurogenic bladder 01/20/2018   Reactive depression    Benign essential HTN    Acute postoperative anemia due to expected blood loss    Central cord syndrome at C6 level of cervical spinal cord 11/29/2017   Allergy to alpha-gal 11/25/2016   SCC (squamous cell carcinoma) 04/08/2014    REFERRING DIAG: Central Cord Syndrome at C6 of the Cervical Spinal Cord, Fall with Bilateral Closed Hip Fractures, with ORIF repair with Intramedullary Nailing of the Left Hip Fracture.  THERAPY DIAG:  Muscle weakness (generalized)  Rationale for Evaluation and Treatment Rehabilitation  PERTINENT HISTORY: Patient s/p fall November 29, 2017 resulting in diagnosis of central cord syndrome at C 6 level.  She has had therapy in multiple venues but with recent move to [facility], therapy staff recommended she seek outpatient therapy  for her needs. Pt. Had a recent fall with Bilateral Closed Hip Fractures, with ORIF repair with Intramedullary Nailing of the Left Hip Fracture.   PRECAUTIONS: None  SUBJECTIVE:   Pt. reports doing well today.  PAIN:  Are you having pain? No  OBJECTIVE:   Manual Therapy:   Pt. tolerated soft tissue massage to the volar, and dorsal surface of each digit on the bilateral hands, and thenar eminence secondary to stiffness following moist heat modality. Soft tissue mobilization was performed for Carpal, and metacarpal spread stretches for the left hand only.(Dermatological surgical site at the dorsal of the right hand still healing with a  bandage present). Manual therapy was performed independent of, and in preparation for therapeutic Ex.   Therapeutic Ex.    Pt. tolerated PROM followed by AROM bilateral wrist extension, PROM for bilateral digit MP, PIP, and DIP flexion, and extension, thumb radial, and palmar abduction.Pt. Worked on BUE strengthening, and reciprocal motion using the UBE while seated for 8 min. with minimal resistance. Constant monitoring was provided.    PATIENT EDUCATION: RUE Distal ROM, and strength Person educated: Patient Education method: Explanation and Demonstration Education comprehension: verbalized understanding, returned demonstration, and needs further education   HOME EXERCISE PROGRAM Continue with ongoing HEPs  Measurements:  12/09/2021:  Shoulder flexion: R: 120(136), L: 138(150) Shoulder abduction: R: 108(120), L: 110(120) Elbow: R: 0-148, L: 0-146 Wrist flexion: R: 55(62), L: 78 Writ extension: R: 34(55), L: 3(4) RD: R: 12(20), L: 16(26) UD: R: 8(24), L: 6(18) Thumb radial abduction: R: 11(20), L: 8(20) Digit flexion to the [facility]: R:  2nd: 5cm(3.5cm), 3rd: 7.5 cm(5cm), 4th: 7cm(3cm), 5th: 6cm(5.5cm) L: 2nd: 8cm(8cm), 3rd: 8cm(8cm), 4th: 7.5cm(7cm), 5th: 7cm(7cm)  02/03/2022:   Shoulder flexion: R: 122(138), L: 148(154) Shoulder abduction: R: 109(125), L: 125(125 Elbow: R: 0-148, L: 0-146 Wrist flexion: R: 60(68), L: 79 Writ extension: R: 40(55), L: 3(10) RD: R: 14(24), L: 20(28) UD: R: 8(24), L: 6(18) Thumb radial abduction: R: 20(25), L: 8(20) Digit flexion to the [facility]: R:  2nd: 4.5cm(3cm), 3rd: 6.5cm(4.5cm), 4th: 6.5 cm(3cm), 5th: 4.5cm(5cm) L: 2nd: 8cm(8cm), 3rd: 8cm(7cm), 4th: 7.5cm(7cm), 5th: 6.5cm(6.5cm)   04/07/2022:   Shoulder flexion: R: 125(150), L: 160(160) Shoulder abduction: R: 109(125), L: 125(125 Elbow: R: 0-148, L: 0-146 Wrist flexion: R: 60(68), L: 79 Writ extension: R: 40(55), L: 5(18) RD: R: 14(24), L: 20(28) UD: R: 18(24), L: 8(18) Thumb radial  abduction: R: 20(25), L: 8(20) Digit flexion to the [facility]: R:  2nd: 4 cm(4cm), 3rd: 6.5cm(4cm), 4th: 7 cm (5cm), 5th: 6cm(5cm) L: 2nd: 8cm(8cm), 3rd: 8cm(7cm), 4th: 7.5cm(7cm), 5th: 7cm(7cm)           OT Long Term Goals - 07/06/21 1106                                                                                          Target Date: 06/30/2022    OT LONG TERM GOAL #1   Title Patient and caregiver will demonstrate understanding of home exercise program for ROM.    Baseline 12/09/2021: Pt. Reports the restorative ROM program has not resumed at the facility. 10/28/2021: Pt. Is attempting exercises/stretches  at home. The facility restorative aides have not resumed exercises. 09/14/2021:  Pt. Reports inconsistencies with restorative ROM at  her care home. Pt/caregiver education to be provided about ongoing ROM needs.    Time 12    Period Weeks    Status Deferred     OT LONG TERM GOAL #2   Title Pt.  will improve BUE strength to be able to sustain her UE's in elevation to efficiently, and effectively perform hair care, and self-grooming tasks.   Baseline 02/03/2022: Pt. has improved, and is now able to sustain her Bilateral UEs in elevation long enough to efficiently, and effectively perform haircare, and self-grooming tasks. 12/09/2021: right shoulder 4-/5, left shoulder 4/5. Pt. has improved to be able to reach up for hair care, and self-grooming tasks. 10/28/2021: Right shoulder 3+/5, Left shoulder 4-/5 09/14/2021:  BUE shoulder strength: 3+/5 for shoulder flexion, and abduction.   Time 12    Period Weeks    Status Achieved               OT LONG TERM GOAL #3   Title Patient will demonstrate improved composite finger flexion to hold deodorant to apply to underarms.    Baseline 04/07/2022: Pt. Has improved with right 2nd, and 3rd digit flexion [facility] the [facility]. Pt. Continues to have difficulty securely holding and applying deodorant.02/03/2022: Pt. Has improved with digit flexion, however continues  to have difficulty securely holding and using deoderant. 12/09/2021: Bilateral hand/digit MP, PIP, and DIP extension tightness limits her ability achieve digit flexion to hold, and apply deodorant. 10/28/2021:  Pt. Continues to have difficulty holding the deodorant. 01/12/2021: Pt. Presents with limited digit extension. Pt. Is able to initiate holding deodorant, however is unable to hold it while using it.   Time 12    Period Weeks    Status Ongoing   Target Date                  OT LONG TERM GOAL #4   Title Pt. Will improve bilateral wrist extension in preparation for anticipating, and initiating reaching for objects at the table.    Baseline 04/07/2022: Rght: 40(55) Left: 5(18) 02/03/2022: Right: 34(55) Left: 3(4) 12/09/2021: Right  34(55)  Left  3(4) 10/28/2021:  Right 22(38), Left 0(15) 09/14/2021:  Right: 17(35), left 2(15)   Time 12   Period weeks   Status Ongoing   Target Date      OT LONG TERM GOAL #5   Title Pt. will write, and sign her name with 100% legibility, and modified independence.    Baseline 04/07/2022: Pt. Reports that she continues to present with shaky appearing writing. Pt.reports that she is writing more when filling out her menu, and complete puzzles 02/03/2022: Pt. Continues to report legibility appears "shaky" when attempting signing her name. Pt.reports that she is writing more when filling out her menu, and complete puzzles.12/09/2021: Pt. Reports legibility appears "shaky" when attempting signing her name. Pt.reports that she is writing more when filling out her menu. 10/28/2021: pt. is able to sign her name, however legibility continues to be inconsistent. Pt. Reports "shaky appearance". 09/14/2021: Pt. continues to fill out her daily menu and complete puzzles. Pt. Continues to present with limited writing legibility.   Time 12    Period Weeks    Status  Ongoing         OT LONG TERM GOAL  #6   TITLE Pt. will turn pages in a book with modified independence.  Baseline 02/03/2022: Pt. Is able to effectively turn pages of a book. 10/28/2021: Pt. Is able to consistently turn book pages. 09/14/2021: Pt. has difficulty turning pages.    Time 12    Period Weeks    Status Achieved       OT LONG TERM GOAL  #7  TITLE Pt. Will increase  bilateral lateral pinch strength by 2 lbs to be able to securely grasp items during ADLs, and IADL tasks.   Baseline 04/07/2022: NT-Pinch meter out for calibration. 02/03/2022: Right: 6#, Left: 4# 12/09/2021:  Right: 6#, Left: 4#   Time 12  Period Weeks  Status Ongoing           OT LONG TERM GOAL  #8  TITLE Pt. Will button a sweater with large 1" buttons, and zip her jacket with modified independence.   Baseline 04/07/2022: Pt. Has difficulty manipulating buttons, however was introduced to a buttonhook. Pt. Was able to hold, and use a buttonhook for buttons on a shirt. 02/03/2022: Pt. is unable to manipulate buttons on her sweater, or zippers on her jacket.  Time 12  Period Weeks  Status Ongoing   OT LONG TERM GOAL  #9  TITLE Pt. will complete plant care with modified independence.  Baseline 04/07/2022: Pt. is now able to hold a cup and water her plants.02/03/2022: Pt. has difficulty caring for her plants.   Time 12  Period Weeks  Status Ongoing   OT LONG TERM GOAL  #10  TITLE Pt. will demonstrate adaptive techniques to assist with the efficiency of self-dressing, or morning care tasks.  Baseline 04/07/2022: Pt. Continues to require assist with the efficiency of self-dressing,a nd morning care tasks.02/03/2022: Pt. requires assist from caregivers 2/2 time limitations during morning care.   Time 12  Period Weeks  Status Ongoing     Plan - 07/22/21 1737     Clinical Impression Statement  Pt. reports that her left arm continues to feel better overall, hematomas/area of swelling at the left shoulder, and elbow are improving. Pt. Tolerated the BUE UBE well today.  Pt. Continues to  tolerate distal bilateral wrist, and  digit  ROM well. Pt. continues to present with tightness at the bilateral thumb webspace, and bilateral wrist flexor tightness, as well as digit MP, PIP, and DIP extensor tightness which continues to limit her ability to complete ADL tasks efficiently. Pt. continues to benefit from working on impoving ROM in order to work towards increasing bilateral hand grasp on objects, and increase engagement of her bilateral hands during ADLs, and IADL tasks. .                        OT Occupational Profile and History Detailed Assessment- Review of Records and additional review of physical, cognitive, psychosocial history related to current functional performance    Occupational performance deficits (Please refer to evaluation for details): ADL's;IADL's;Leisure;Social Participation    Body Structure / Function / Physical Skills ADL;Continence;Dexterity;Flexibility;Strength;ROM;Balance;Coordination;FMC;IADL;Endurance;UE functional use;Decreased knowledge of use of DME;[facility]    Psychosocial Skills Coping Strategies    Rehab Potential Fair    Clinical Decision Making Several treatment options, min-mod task modification necessary    Comorbidities Affecting Occupational Performance: Presence of comorbidities impacting occupational performance    Comorbidities impacting occupational performance description: contractures of bilateral hands, dependent transfers,    Modification or Assistance to Complete Evaluation  No modification of tasks or assist necessary to complete eval    OT Frequency 2x / week  OT Duration 12 weeks    OT Treatment/Interventions Self-care/ADL training;Cryotherapy;Therapeutic exercise;DME and/or AE instruction;Balance training;Neuromuscular education;Manual Therapy;Splinting;Moist Heat;Passive range of motion;Therapeutic activities;Patient/family education    Plan continue to progress ROM of digits, wrists and shoulders as it pertains to completion of ADL tasks that pt values.     Consulted and Agree with Plan of Care Patient            Olegario Messier, MS, OTR/L  Olegario Messier, OT 05/10/2022, 4:43 PM      `

## 2022-05-10 NOTE — Therapy (Signed)
OUTPATIENT PHYSICAL THERAPY NEURO TREATMENT/Physical Therapy Progress Note   Dates of reporting period  03/24/2022   to  05/10/2022  Patient Name: Sherry [name] MRN: [id number] DOB:08-Apr-1934, 88 y.o., female Today's Date: 05/10/2022   PCP: Sherry Mealing, MD REFERRING PROVIDER: Deeann [name]   PT End of Session - 05/10/22 1301     Visit Number 40    Number of Visits 58    Date for PT Re-Evaluation 06/30/22    Authorization Time Period Initial cert 09/30/5619-30/[id number]/5784; Recert 01/11/2022-04/05/2022; Recert 04/07/2022-06/30/2022    Progress Note Due on Visit 50    PT Start Time 1147    PT Stop Time 1223    PT Time Calculation (min) 36 min    Equipment Utilized During Treatment Gait belt    Activity Tolerance Patient tolerated treatment well    Behavior During Therapy WFL for tasks assessed/performed                           Past Medical History:  Diagnosis Date   Acute blood loss anemia    Arthritis    Cancer    skin   Central cord syndrome at C6 level of cervical spinal cord 11/29/2017   Hypertension    Protein-calorie malnutrition, severe 01/24/2018   S/P insertion of IVC (inferior vena caval) filter 01/24/2018   Tetraparesis    Past Surgical History:  Procedure Laterality Date   ANTERIOR CERVICAL DECOMP/DISCECTOMY FUSION N/A 11/29/2017   Procedure: Cervical five-six, six-seven Anterior Cervical Decompression Fusion;  Surgeon: Sherry Pierini, MD;  Location: [facility] OR;  Service: Neurosurgery;  Laterality: N/A;  Cervical five-six, six-seven Anterior Cervical Decompression Fusion   CATARACT EXTRACTION     FEMUR IM NAIL Left 08/16/2021   Procedure: INTRAMEDULLARY (IM) NAIL FEMORAL;  Surgeon: Sherry Saint, MD;  Location: [facility] ORS;  Service: Orthopedics;  Laterality: Left;   IR IVC FILTER PLMT / S&I /IMG GUID/MOD SED  12/08/2017   Patient Active Problem List   Diagnosis Date Noted   Hypertensive heart disease with other congestive heart failure  02/14/2022   Chronic indwelling Foley catheter 02/14/2022   Closed hip fracture 08/15/2021   DVT (deep venous thrombosis) 08/15/2021   Quadriplegia 08/15/2021   Fall 08/15/2021   Constipation due to slow transit 08/31/2018   Trauma 06/05/2018   Neuropathic pain 06/05/2018   Neurogenic bowel 06/05/2018   Vaginal yeast infection 01/30/2018   Healthcare-associated pneumonia 01/25/2018   Chronic allergic rhinitis 01/24/2018   Depression with anxiety 01/24/2018   UTI due to Klebsiella species 01/24/2018   Protein-calorie malnutrition, severe 01/24/2018   S/P insertion of IVC (inferior vena caval) filter 01/24/2018   Tetraparesis 01/20/2018   Neurogenic bladder 01/20/2018   Reactive depression    Benign essential HTN    Acute postoperative anemia due to expected blood loss    Central cord syndrome at C6 level of cervical spinal cord 11/29/2017   Allergy to alpha-gal 11/25/2016   SCC (squamous cell carcinoma) 04/08/2014    ONSET DATE: 08/15/2021 (fall with B hip fx); Initial injury was in 2019- quadraplegia due to central cord syndrome of C6 secondary to neck fracture.   REFERRING DIAG: Bilateral Hip fx; Left Open- s/p Left IM nail femoral on 08/16/2021; Right closed  THERAPY DIAG:  Muscle weakness (generalized)  Other lack of coordination  Abnormality of gait and mobility  Difficulty in walking, not elsewhere classified  Other abnormalities of gait and mobility  Central cord syndrome, sequela  Closed  fracture of both hips, initial encounter  Rationale for Evaluation and Treatment Rehabilitation  SUBJECTIVE:   SUBJECTIVE STATEMENT:    Patient reports having no issues over the weekend and no falls or pain today.    Pt accompanied by:  Paid caregiver-   PERTINENT HISTORY: Patient was previous patient (see previous episode for specifics) She experienced a fall at home on 08/15/2021 and was in Hospital from 08/15/2021- 08/20/2021.  Most recent history per Sherry [name] on  08/20/2021.  Sherry [name] is a 88 y.o. 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   Patient has history of quadriplegia, and has been doing PT/OT with improvement of function. Pt has some use of both arms and can feed herself. She can walk a little bit with assistance. Pt accidentally fell at about 930 this morning after taking shower.  She injured her hips. Hospital Course:    Closed left hip fracture [facility]) s/p mechanical fall Closed right hip fracture (?old) Acute blood loss anemia --pt is s/p  INTRAMEDULLARY (IM) NAIL FEMORAL-left on 7/16  PAIN:  Are you having pain? NO: NPRS scale: 0/10 Pain location: Right hip Pain description: Ache Aggravating factors: any active Movement of right side Relieving factors: Rest  PRECAUTIONS: Fall  WEIGHT BEARING RESTRICTIONS Yes Bilateral non-weight bearing B LE's  FALLS: Has patient fallen in last 6 months? Yes. Number of falls 1  LIVING ENVIRONMENT: Lives with: lives with their family and lives in a nursing home Lives in: Other Long term care at [facility] Stairs: No Has following equipment at home: Wheelchair (power), Ramped entry, and hoyer lift  PLOF: Needs assistance with ADLs, Needs assistance with homemaking, Needs assistance with gait, Needs assistance with transfers, and Leisure: Work puzzles, read, Watch TV  PATIENT GOALS Patient goal is to walk; Be able to strengthen my arms for better use  OBJECTIVE:   DIAGNOSTIC FINDINGS: Overall cognitive statusEXAM: DG HIP (WITH OR WITHOUT PELVIS) 2-3V LEFT; DG HIP (WITH OR WITHOUT PELVIS) 2-3V RIGHT   COMPARISON:  CT of the abdomen and pelvis 01/21/2020   FINDINGS: Left proximal femur fracture noted just below the intertrochanteric region. Marked ventral  angulation noted on the cross-table image. Slight varus angulation present   Comminuted intratrochanteric fracture present on the right also with marked ventral angulation.   IMPRESSION: 1. Bilateral hip fractures. 2. Comminuted intertrochanteric fracture of the right hip. 3. Angulated fracture of the proximal left femur just below the intertrochanteric region.     Electronically Signed   By: Marin Roberts M.D.   On: 08/15/2021 11:54:   COGNITION:  Within functional limits for tasks assessed   SENSATION: Light touch: Impaired   EDEMA:  None  Strength R/L 2-/2- Shoulder flexion (anterior deltoid/pec major/coracobrachialis, axillary n. (C5-6) and musculocutaneous n. (C5-7)) 2-/3+ (within available ROM around 130 on lef)Shoulder abduction (deltoid/supraspinatus, axillary/suprascapular n, C5) 3+/3+ Shoulder external rotation (infraspinatus/teres minor) 4/4 Shoulder internal rotation (subcapularis/lats/pec major) 3+/3+ Shoulder extension (posterior deltoid, lats, teres major, axillary/thoracodorsal n.) 3+/3+ Shoulder horizontal abduction 4/4 Elbow flexion (biceps brachii, brachialis, brachioradialis, musculoskeletal n, C5-6) 4/4 Elbow extension (triceps, radial n, C7) 2-/2- Wrist Extension 3+/3+ Wrist Flexion   LOWER EXTREMITY MMT:    MMT Right 01/11/22 Left 01/11/22  Hip flexion 2- 2-  Hip extension 2- 2-  Hip abduction 2- 2-  Hip adduction 2- 3  Hip internal rotation 2- 2-  Hip external rotation 2- 2-  Knee flexion 2- 2-  Knee extension 2+ 3+  Ankle dorsiflexion 2- NT - boot  Ankle plantarflexion 2- NT- boot  Ankle inversion 2- NT- boot  Ankle eversion 2- NT- boot  (Blank rows = not tested)  BED MOBILITY:  Unable to test due to NWB status of BLE- Patient currently dependent with all bed mobility using hoyer and caregiver assist  TRANSFERS: Currently BLE NWB- Dependent on use of hoyer for all transfers  FUNCTIONAL TESTs:   PATIENT SURVEYS:  FOTO  12  TODAY'S TREATMENT:     THERE.EX:  PROM to bilateral knee flex/ext (specifically left knee flex - trying to achieve closer to 90 deg)  AAROM- B hip flex 2 sets x 15 reps  AROM- LAQ- each LE 2 sets of 15 reps AROM- LAQ- Right up as high as possible- (near Full ROM)- 2 sets of 10 reps B hip abd/add (resistive with RTB) x 10 reps Manual resistive Leg press 2 sets of 15 reps    Resistive Hip add with ball squeeze x 5 sec hold -2 sets of 10 reps each LE  Pt educated throughout session about proper posture and technique with exercises. Improved exercise technique, movement at target joints, use of target muscles after min to mod verbal, visual, tactile cues.   PATIENT EDUCATION: Education details: PT plan of care/use of resistive bands Person educated: Patient Education method: Explanation, Demonstration, Tactile cues, and Verbal cues Education comprehension: verbalized understanding, returned demonstration, verbal cues required, tactile cues required, and needs further education   HOME EXERCISE PROGRAM: No changes at this time  Access Code: [facility] URL: [email or web address] Date: 11/23/2021 Prepared by: Precious Bard Exercises - Seated Gluteal Sets - 2 x daily - 7 x weekly - 2 sets - 10 reps - 5 hold - Seated Quad Set - 2 x daily - 7 x weekly - 2 sets - 10 reps - 5 hold - Seated Long Arc Quad - 2 x daily - 7 x weekly - 2 sets - 10 reps - 5 hold - Seated March - 2 x daily - 7 x weekly - 2 sets - 10 reps - 5 hold - Seated Hip Abduction - 2 x daily - 7 x weekly - 2 sets - 10 reps - 5 hold - Seated Shoulder Shrugs - 2 x daily - 7 x weekly - 2 sets - 10 reps - 5 hold - Wheelchair Pressure Relief - 2 x daily - 7 x weekly - 2 sets - 10 reps - 5 hold  GOALS: Goals reviewed with patient? Yes  SHORT TERM GOALS: Target date: 02/22/2022  Pt will be independent with initial UE strengthening HEP in order to improve strength and balance in order to decrease fall risk and improve  function at home and work. Baseline: 10/19/2021- patient with no formal UE HEP; 12/02/2021= Patient verbalized knowledge of HEP including use of theraband for UE strengthening.  Goal status: GOAL MET  LONG TERM GOALS: Target date: 06/30/2022  Pt will be independent with final for UE/LE HEP in order to improve strength and balance in order to decrease fall risk and improve function at home and work. Baseline: Patient is currently BLE NWB and unable to participate in HEP. Has order for UE strengthening. 01/11/2022- Patient now able to participate in LE strengthening although NWB still- good understanding for some basic exercises. Will keep goal active to incorporate progressive LE strengthening  exercises. 04/07/2022= Patient still participating in progressive LE seated HEP and has no questions at this time.  Goal status: MET  2.  Pt will improve FOTO to target score of 35  to display perceived improvements in ability to complete ADL's.  Baseline: 10/19/2021= 12; 12/02/2021= 17; 01/11/2022=17; 04/07/2022= 17 Goal status: Ongoing  3.  Pt will increase strength of B UE  by at least 1/2 MMT grade in order to demonstrate improvement in strength and function  Baseline: patient has range of 2-/5 to 4/5 BUE Strength; 12/02/2021= 4/5 except with wrist ext. 01/11/2022= 4/5 B UE strength expept for wrist Goal status: Partially met  4. Pt. Will increase strength of RLE by at least 1/2 MMT grade in order to demonstrate improvement in Standing/transfers.  Baseline: 2-/5 Right hip flex/knee ext/flex; 04/07/2022= 2- with right hip flex/knee ext (lacking 28 deg from zero) 4/8= Patient able to ext right knee lacking 18 deg from zero.   Goal status: Progressing  5. Pt. Will demo ability to stand pivot transfer with max assist for improved functional mobility and less dependent need on mechanical device.   Baseline: Dependent on hoyer lift for all transfers; 04/07/2022- Unable to test secondary to patient with recent UTI and  right hand procedure - will attempt to reassess next visit. ; 04/12/22: Unable to successfully stand due to feet plates on loaned power w/c; 05/10/2022- Patient still having to use rental chair and has not received her original power w/c back to practice SPT.   Goal status: ONGOING  6. Pt. Will demonstrate improved functional LE strength as seen by ability to stand > 2 min for improved transfer ability and pregait abilities.   Baseline: not assessed today due to recent dx: UTI; 04/07/2022- Unable to test secondary to patient with recent UTI and right hand procedure - will attempt to reassess next visit; 04/12/22: Unable to successfully stand due to feet plates on loaned power w/c4/09/2022- Patient still having to use rental chair and has not received her original power w/c back to practice Standing.   Goal status: ONGOING  ASSESSMENT:  CLINICAL IMPRESSION:  Patient presents with good motivation for today's progress visit note. She did present with improving right quad strength- still not full ROM but improving. She is still in rental w/c and foot plate restricts her ability to stand. [facility] and attempt standing. Patient's condition has the potential to improve in response to therapy. Maximum improvement is yet to be obtained. The anticipated improvement is attainable and reasonable in a generally predictable time. Pt will continue to benefit from skilled PT services to optimize independence and reduced caregiver support.    OBJECTIVE IMPAIRMENTS Abnormal gait, decreased activity tolerance, decreased balance, decreased coordination, decreased endurance, decreased mobility, difficulty walking, decreased ROM, decreased strength, hypomobility, impaired flexibility, impaired UE functional use, postural dysfunction, and pain.   ACTIVITY LIMITATIONS carrying, lifting, bending, sitting, standing, squatting, sleeping, stairs, transfers, bed mobility, continence, bathing,  toileting, dressing, self feeding, reach over head, hygiene/grooming, and caring for others  PARTICIPATION LIMITATIONS: meal prep, cleaning, laundry, medication management, personal finances, interpersonal relationship, driving, shopping, community activity, and yard work  PERSONAL FACTORS Age, Time since onset of injury/illness/exacerbation, and 1-2 comorbidities: HTN, cervical Sx  are also affecting patient's functional outcome.   REHAB POTENTIAL: Good  CLINICAL DECISION MAKING: Evolving/moderate complexity  EVALUATION COMPLEXITY: Moderate  PLAN: PT FREQUENCY: 1-2x/week  PT DURATION: 12 weeks  PLANNED INTERVENTIONS: Therapeutic exercises, Therapeutic activity, Neuromuscular re-education, Balance  training, Gait training, Patient/Family education, Self Care, Joint mobilization, DME instructions, Dry Needling, Electrical stimulation, Wheelchair mobility training, Spinal mobilization, Cryotherapy, Moist heat, and Manual therapy  PLAN FOR NEXT SESSION: Continue with weight bearing activities, hoyer over to mat table and practice standing next visit and LE strengthening as appropriate.   Louis Meckel, PT Physical Therapist- [facility]  05/10/2022, 1:05 PM

## 2022-05-12 ENCOUNTER — Ambulatory Visit: Payer: Medicare HMO

## 2022-05-12 ENCOUNTER — Ambulatory Visit: Payer: Medicare HMO | Admitting: Occupational Therapy

## 2022-05-12 DIAGNOSIS — M6281 Muscle weakness (generalized): Secondary | ICD-10-CM

## 2022-05-12 DIAGNOSIS — R269 Unspecified abnormalities of gait and mobility: Secondary | ICD-10-CM

## 2022-05-12 DIAGNOSIS — S14129S Central cord syndrome at unspecified level of cervical spinal cord, sequela: Secondary | ICD-10-CM

## 2022-05-12 DIAGNOSIS — R2689 Other abnormalities of gait and mobility: Secondary | ICD-10-CM

## 2022-05-12 DIAGNOSIS — S72001A Fracture of unspecified part of neck of right femur, initial encounter for closed fracture: Secondary | ICD-10-CM

## 2022-05-12 DIAGNOSIS — R262 Difficulty in walking, not elsewhere classified: Secondary | ICD-10-CM

## 2022-05-12 DIAGNOSIS — R2681 Unsteadiness on feet: Secondary | ICD-10-CM

## 2022-05-12 DIAGNOSIS — R278 Other lack of coordination: Secondary | ICD-10-CM

## 2022-05-12 NOTE — Therapy (Addendum)
Occupational Therapy Treatment Note      Patient Name: Sherry [name] MRN: [id number] DOB:11/25/1934, 88 y.o., female Today's Date: 05/12/2022                                         PCP: Dr. Tillman Abideichard Letvak REFERRING PROVIDER: Dr. Tillman Abideichard Letvak   OT End of Session - 05/12/22 2126     Visit Number [id number]    Number of Visits 72    Date for OT Re-Evaluation 06/30/22    OT Start Time 1100    OT Stop Time 1145    OT Time Calculation (min) 45 min    Activity Tolerance Patient tolerated treatment well    [facility] tasks assessed/performed                  Past Medical History:  Diagnosis Date   Acute blood loss anemia    Arthritis    Cancer    skin   Central cord syndrome at C6 level of cervical spinal cord 11/29/2017   Hypertension    Protein-calorie malnutrition, severe 01/24/2018   S/P insertion of IVC (inferior vena caval) filter 01/24/2018   Tetraparesis    Past Surgical History:  Procedure Laterality Date   ANTERIOR CERVICAL DECOMP/DISCECTOMY FUSION N/A 11/29/2017   Procedure: Cervical five-six, six-seven Anterior Cervical Decompression Fusion;  Surgeon: Jadene Pierinistergard, Thomas A, MD;  Location: [facility] OR;  Service: Neurosurgery;  Laterality: N/A;  Cervical five-six, six-seven Anterior Cervical Decompression Fusion   CATARACT EXTRACTION     FEMUR IM NAIL Left 08/16/2021   Procedure: INTRAMEDULLARY (IM) NAIL FEMORAL;  Surgeon: Deeann SaintMiller, Howard, MD;  Location: [facility] ORS;  Service: Orthopedics;  Laterality: Left;   IR IVC FILTER PLMT / S&I /IMG GUID/MOD SED  12/08/2017   Patient Active Problem List   Diagnosis Date Noted   Hypertensive heart disease with other congestive heart failure 02/14/2022   Chronic indwelling Foley catheter 02/14/2022   Closed hip fracture 08/15/2021   DVT (deep venous thrombosis) 08/15/2021   Quadriplegia 08/15/2021   Fall 08/15/2021   Constipation due to slow transit 08/31/2018   Trauma 06/05/2018   Neuropathic pain 06/05/2018    Neurogenic bowel 06/05/2018   Vaginal yeast infection 01/30/2018   Healthcare-associated pneumonia 01/25/2018   Chronic allergic rhinitis 01/24/2018   Depression with anxiety 01/24/2018   UTI due to Klebsiella species 01/24/2018   Protein-calorie malnutrition, severe 01/24/2018   S/P insertion of IVC (inferior vena caval) filter 01/24/2018   Tetraparesis 01/20/2018   Neurogenic bladder 01/20/2018   Reactive depression    Benign essential HTN    Acute postoperative anemia due to expected blood loss    Central cord syndrome at C6 level of cervical spinal cord 11/29/2017   Allergy to alpha-gal 11/25/2016   SCC (squamous cell carcinoma) 04/08/2014    REFERRING DIAG: Central Cord Syndrome at C6 of the Cervical Spinal Cord, Fall with Bilateral Closed Hip Fractures, with ORIF repair with Intramedullary Nailing of the Left Hip Fracture.  THERAPY DIAG:  Muscle weakness (generalized)  Rationale for Evaluation and Treatment Rehabilitation  PERTINENT HISTORY: Patient s/p fall November 29, 2017 resulting in diagnosis of central cord syndrome at C 6 level.  She has had therapy in multiple venues but [facility], therapy staff recommended she seek outpatient therapy for her needs. Pt. Had a recent fall with Bilateral Closed  Hip Fractures, with ORIF repair with Intramedullary Nailing of the Left Hip Fracture.   PRECAUTIONS: None  SUBJECTIVE:   Pt. reports doing well today.  PAIN:  Are you having pain? No  OBJECTIVE:   Manual Therapy:   Pt. tolerated soft tissue massage to the volar, and dorsal surface of each digit on the bilateral hands, and thenar eminence secondary to stiffness following moist heat modality. Soft tissue mobilization was performed for Carpal, and metacarpal spread stretches for the left hand only.(Dermatological surgical site at the dorsal of the right hand still healing with a bandage present). Manual therapy was performed independent of, and in  preparation for therapeutic Ex.   Therapeutic Ex.    Pt. tolerated PROM followed by AROM bilateral wrist extension, PROM for bilateral digit MP, PIP, and DIP flexion, and extension, thumb radial, and palmar abduction.Pt. Worked on BUE strengthening, and reciprocal motion using the UBE while seated for 8 min. with minimal resistance. Constant monitoring was provided.    PATIENT EDUCATION: RUE Distal ROM, and strength Person educated: Patient Education method: Explanation and Demonstration Education comprehension: verbalized understanding, returned demonstration, and needs further education   HOME EXERCISE PROGRAM Continue with ongoing HEPs  Measurements:  12/09/2021:  Shoulder flexion: R: 120(136), L: 138(150) Shoulder abduction: R: 108(120), L: 110(120) Elbow: R: 0-148, L: 0-146 Wrist flexion: R: 55(62), L: 78 Writ extension: R: 34(55), L: 3(4) RD: R: 12(20), L: 16(26) UD: R: 8(24), L: 6(18) Thumb radial abduction: R: 11(20), L: 8(20) Digit flexion to the [facility]: R:  2nd: 5cm(3.5cm), 3rd: 7.5 cm(5cm), 4th: 7cm(3cm), 5th: 6cm(5.5cm) L: 2nd: 8cm(8cm), 3rd: 8cm(8cm), 4th: 7.5cm(7cm), 5th: 7cm(7cm)  02/03/2022:   Shoulder flexion: R: 122(138), L: 148(154) Shoulder abduction: R: 109(125), L: 125(125 Elbow: R: 0-148, L: 0-146 Wrist flexion: R: 60(68), L: 79 Writ extension: R: 40(55), L: 3(10) RD: R: 14(24), L: 20(28) UD: R: 8(24), L: 6(18) Thumb radial abduction: R: 20(25), L: 8(20) Digit flexion to the [facility]: R:  2nd: 4.5cm(3cm), 3rd: 6.5cm(4.5cm), 4th: 6.5 cm(3cm), 5th: 4.5cm(5cm) L: 2nd: 8cm(8cm), 3rd: 8cm(7cm), 4th: 7.5cm(7cm), 5th: 6.5cm(6.5cm)   04/07/2022:   Shoulder flexion: R: 125(150), L: 160(160) Shoulder abduction: R: 109(125), L: 125(125 Elbow: R: 0-148, L: 0-146 Wrist flexion: R: 60(68), L: 79 Writ extension: R: 40(55), L: 5(18) RD: R: 14(24), L: 20(28) UD: R: 18(24), L: 8(18) Thumb radial abduction: R: 20(25), L: 8(20) Digit flexion to the [facility]: R:  2nd: 4  cm(4cm), 3rd: 6.5cm(4cm), 4th: 7 cm (5cm), 5th: 6cm(5cm) L: 2nd: 8cm(8cm), 3rd: 8cm(7cm), 4th: 7.5cm(7cm), 5th: 7cm(7cm)           OT Long Term Goals - 07/06/21 1106                                                                                          Target Date: 06/30/2022    OT LONG TERM GOAL #1   Title Patient and caregiver will demonstrate understanding of home exercise program for ROM.    Baseline 12/09/2021: Pt. Reports the restorative ROM program has not resumed at the facility. 10/28/2021: Pt. Is attempting exercises/stretches at home. The facility restorative aides have not resumed exercises. 09/14/2021:  Pt. Reports inconsistencies with restorative ROM at  her care home. Pt/caregiver education to be provided about ongoing ROM needs.    Time 12    Period Weeks    Status Deferred     OT LONG TERM GOAL #2   Title Pt.  will improve BUE strength to be able to sustain her UE's in elevation to efficiently, and effectively perform hair care, and self-grooming tasks.   Baseline 02/03/2022: Pt. has improved, and is now able to sustain her Bilateral UEs in elevation long enough to efficiently, and effectively perform haircare, and self-grooming tasks. 12/09/2021: right shoulder 4-/5, left shoulder 4/5. Pt. has improved to be able to reach up for hair care, and self-grooming tasks. 10/28/2021: Right shoulder 3+/5, Left shoulder 4-/5 09/14/2021:  BUE shoulder strength: 3+/5 for shoulder flexion, and abduction.   Time 12    Period Weeks    Status Achieved               OT LONG TERM GOAL #3   Title Patient will demonstrate improved composite finger flexion to hold deodorant to apply to underarms.    Baseline 04/07/2022: Pt. Has improved with right 2nd, and 3rd digit flexion towards the [facility] [place]. Pt. Continues to have difficulty securely holding and applying deodorant.02/03/2022: Pt. Has improved with digit flexion, however continues to have difficulty securely holding and using deoderant. 12/09/2021:  Bilateral hand/digit MP, PIP, and DIP extension tightness limits her ability achieve digit flexion to hold, and apply deodorant. 10/28/2021:  Pt. Continues to have difficulty holding the deodorant. 01/12/2021: Pt. Presents with limited digit extension. Pt. Is able to initiate holding deodorant, however is unable to hold it while using it.   Time 12    Period Weeks    Status Ongoing   Target Date                  OT LONG TERM GOAL #4   Title Pt. Will improve bilateral wrist extension in preparation for anticipating, and initiating reaching for objects at the table.    Baseline 04/07/2022: Rght: 40(55) Left: 5(18) 02/03/2022: Right: 34(55) Left: 3(4) 12/09/2021: Right  34(55)  Left  3(4) 10/28/2021:  Right 22(38), Left 0(15) 09/14/2021:  Right: 17(35), left 2(15)   Time 12   Period weeks   Status Ongoing   Target Date      OT LONG TERM GOAL #5   Title Pt. will write, and sign her name with 100% legibility, and modified independence.    Baseline 04/07/2022: Pt. Reports that she continues to present with shaky appearing writing. Pt.reports that she is writing more when filling out her menu, and complete puzzles 02/03/2022: Pt. Continues to report legibility appears "shaky" when attempting signing her name. Pt.reports that she is writing more when filling out her menu, and complete puzzles.12/09/2021: Pt. Reports legibility appears "shaky" when attempting signing her name. Pt.reports that she is writing more when filling out her menu. 10/28/2021: pt. is able to sign her name, however legibility continues to be inconsistent. Pt. Reports "shaky appearance". 09/14/2021: Pt. continues to fill out her daily menu and complete puzzles. Pt. Continues to present with limited writing legibility.   Time 12    Period Weeks    Status  Ongoing         OT LONG TERM GOAL  #6   TITLE Pt. will turn pages in a book with modified independence.    Baseline 02/03/2022: Pt. Is able to effectively turn pages of a book.  10/28/2021: Pt. Is able to consistently turn book pages. 09/14/2021: Pt. has difficulty turning pages.    Time 12    Period Weeks    Status Achieved       OT LONG TERM GOAL  #7  TITLE Pt. Will increase  bilateral lateral pinch strength by 2 lbs to be able to securely grasp items during ADLs, and IADL tasks.   Baseline 04/07/2022: NT-Pinch meter out for calibration. 02/03/2022: Right: 6#, Left: 4# 12/09/2021:  Right: 6#, Left: 4#   Time 12  Period Weeks  Status Ongoing           OT LONG TERM GOAL  #8  TITLE Pt. Will button a sweater with large 1" buttons, and zip her jacket with modified independence.   Baseline 04/07/2022: Pt. Has difficulty manipulating buttons, however was introduced to a buttonhook. Pt. Was able to hold, and use a buttonhook for buttons on a shirt. 02/03/2022: Pt. is unable to manipulate buttons on her sweater, or zippers on her jacket.  Time 12  Period Weeks  Status Ongoing   OT LONG TERM GOAL  #9  TITLE Pt. will complete plant care with modified independence.  Baseline 04/07/2022: Pt. is now able to hold a cup and water her plants.02/03/2022: Pt. has difficulty caring for her plants.   Time 12  Period Weeks  Status Ongoing   OT LONG TERM GOAL  #10  TITLE Pt. will demonstrate adaptive techniques to assist with the efficiency of self-dressing, or morning care tasks.  Baseline 04/07/2022: Pt. Continues to require assist with the efficiency of self-dressing,a nd morning care tasks.02/03/2022: Pt. requires assist from caregivers 2/2 time limitations during morning care.   Time 12  Period Weeks  Status Ongoing     Plan - 07/22/21 1737     Clinical Impression Statement  Pt. reports that her left shoulder, and arm continues to feel good, with no reports of discomfort following the addition of the UBE. Pt. Tolerated the BUE UBE well today.  Pt. continues to  tolerate distal bilateral wrist, and digit  ROM well. Pt. continues to present with tightness at the bilateral  thumb webspace, and bilateral wrist flexor tightness, as well as digit MP, PIP, and DIP extensor tightness which continues to limit her ability to complete ADL tasks efficiently. Pt. continues to benefit from working on impoving ROM in order to work towards increasing bilateral hand grasp on objects, and increase engagement of bilateral hands during ADLs, and IADL tasks.                         OT Occupational Profile and History Detailed Assessment- Review of Records and additional review of physical, cognitive, psychosocial history related to current functional performance    Occupational performance deficits (Please refer to evaluation for details): ADL's;IADL's;Leisure;Social Participation    Body Structure / Function / Physical Skills ADL;Continence;Dexterity;Flexibility;Strength;ROM;Balance;Coordination;FMC;IADL;Endurance;UE functional use;Decreased knowledge of use of DME;[facility]    Psychosocial Skills Coping Strategies    Rehab Potential Fair    Clinical Decision Making Several treatment options, min-mod task modification necessary    Comorbidities Affecting Occupational Performance: Presence of comorbidities impacting occupational performance    Comorbidities impacting occupational performance description: contractures of bilateral hands, dependent transfers,    Modification or Assistance to Complete Evaluation  No modification of tasks or assist necessary to complete eval    OT Frequency 2x / week    OT Duration 12 weeks    OT Treatment/Interventions Self-care/ADL  training;Cryotherapy;Therapeutic exercise;DME and/or AE instruction;Balance training;Neuromuscular education;Manual Therapy;Splinting;Moist Heat;Passive range of motion;Therapeutic activities;Patient/family education    Plan continue to progress ROM of digits, wrists and shoulders as it pertains to completion of ADL tasks that pt values.    Consulted and Agree with Plan of Care Patient            Olegario Messier, MS,  OTR/L  Olegario Messier, OT 05/12/2022, 9:29 PM      `

## 2022-05-12 NOTE — Therapy (Signed)
OUTPATIENT PHYSICAL THERAPY NEURO TREATMENT   Patient Name: Sherry [name] MRN: [id number] DOB:May 12, 1934, 88 y.o., female Today's Date: 05/12/2022   PCP: Earnestine Mealing, MD REFERRING PROVIDER: Deeann Saint   PT End of Session - 05/12/22 1331     Visit Number 41    Number of Visits 58    Date for PT Re-Evaluation 06/30/22    Authorization Time Period Initial cert [id number]/03/1113; Recert 01/11/2022-04/05/2022; Recert 04/07/2022-06/30/2022    Progress Note Due on Visit 50    PT Start Time 1147    PT Stop Time 1228    PT Time Calculation (min) 41 min    Equipment Utilized During Treatment Gait belt    Activity Tolerance Patient tolerated treatment well    Behavior During Therapy WFL for tasks assessed/performed                           Past Medical History:  Diagnosis Date   Acute blood loss anemia    Arthritis    Cancer    skin   Central cord syndrome at C6 level of cervical spinal cord 11/29/2017   Hypertension    Protein-calorie malnutrition, severe 01/24/2018   S/P insertion of IVC (inferior vena caval) filter 01/24/2018   Tetraparesis    Past Surgical History:  Procedure Laterality Date   ANTERIOR CERVICAL DECOMP/DISCECTOMY FUSION N/A 11/29/2017   Procedure: Cervical five-six, six-seven Anterior Cervical Decompression Fusion;  Surgeon: Jadene Pierini, MD;  Location: [facility] OR;  Service: Neurosurgery;  Laterality: N/A;  Cervical five-six, six-seven Anterior Cervical Decompression Fusion   CATARACT EXTRACTION     FEMUR IM NAIL Left 08/16/2021   Procedure: INTRAMEDULLARY (IM) NAIL FEMORAL;  Surgeon: Deeann Saint, MD;  Location: [facility] ORS;  Service: Orthopedics;  Laterality: Left;   IR IVC FILTER PLMT / S&I /IMG GUID/MOD SED  12/08/2017   Patient Active Problem List   Diagnosis Date Noted   Hypertensive heart disease with other congestive heart failure 02/14/2022   Chronic indwelling Foley catheter 02/14/2022   Closed hip fracture 08/15/2021    DVT (deep venous thrombosis) 08/15/2021   Quadriplegia 08/15/2021   Fall 08/15/2021   Constipation due to slow transit 08/31/2018   Trauma 06/05/2018   Neuropathic pain 06/05/2018   Neurogenic bowel 06/05/2018   Vaginal yeast infection 01/30/2018   Healthcare-associated pneumonia 01/25/2018   Chronic allergic rhinitis 01/24/2018   Depression with anxiety 01/24/2018   UTI due to Klebsiella species 01/24/2018   Protein-calorie malnutrition, severe 01/24/2018   S/P insertion of IVC (inferior vena caval) filter 01/24/2018   Tetraparesis 01/20/2018   Neurogenic bladder 01/20/2018   Reactive depression    Benign essential HTN    Acute postoperative anemia due to expected blood loss    Central cord syndrome at C6 level of cervical spinal cord 11/29/2017   Allergy to alpha-gal 11/25/2016   SCC (squamous cell carcinoma) 04/08/2014    ONSET DATE: 08/15/2021 (fall with B hip fx); Initial injury was in 2019- quadraplegia due to central cord syndrome of C6 secondary to neck fracture.   REFERRING DIAG: Bilateral Hip fx; Left Open- s/p Left IM nail femoral on 08/16/2021; Right closed  THERAPY DIAG:  Muscle weakness (generalized)  Other lack of coordination  Abnormality of gait and mobility  Difficulty in walking, not elsewhere classified  Other abnormalities of gait and mobility  Central cord syndrome, sequela  Closed fracture of both hips, initial encounter  Unsteadiness on feet  Rationale for Evaluation and  Treatment Rehabilitation  SUBJECTIVE:   SUBJECTIVE STATEMENT:    Patient reports having no issues over the weekend and no falls or pain today.    Pt accompanied by:  Paid caregiver-   PERTINENT HISTORY: Patient was previous patient (see previous episode for specifics) She experienced a fall at home on 08/15/2021 and was in Hospital from 08/15/2021- 08/20/2021.  Most recent history per Dr. Shonna ChockNoah Wouk on 08/20/2021.  Sherry [name] is a 88 y.o. female with medical  history significant of quadriplegia due to central cord syndrome of C6 secondary to neck fracture, hypertension, depression with anxiety, neurogenic bladder, bilateral DVT on Xarelto (s/p of IVC filter placement), who presents with fall and hip pain.   Patient has history of quadriplegia, and has been doing PT/OT with improvement of function. Pt has some use of both arms and can feed herself. She can walk a little bit with assistance. Pt accidentally fell at about 930 this morning after taking shower.  She injured her hips. Hospital Course:    Closed left hip fracture [facility](HCC) s/p mechanical fall Closed right hip fracture (?old) Acute blood loss anemia --pt is s/p  INTRAMEDULLARY (IM) NAIL FEMORAL-left on 7/16  PAIN:  Are you having pain? NO: NPRS scale: 0/10 Pain location: Right hip Pain description: Ache Aggravating factors: any active Movement of right side Relieving factors: Rest  PRECAUTIONS: Fall  WEIGHT BEARING RESTRICTIONS Yes Bilateral non-weight bearing B LE's  FALLS: Has patient fallen in last 6 months? Yes. Number of falls 1  LIVING ENVIRONMENT: Lives with: lives with their family and lives in a nursing home Lives in: Other [facility] term care [facility] Stairs: No Has following equipment at home: Wheelchair (power), Ramped entry, and hoyer lift  PLOF: Needs assistance with ADLs, Needs assistance with homemaking, Needs assistance with gait, Needs assistance with transfers, and Leisure: Work puzzles, read, Watch TV  PATIENT GOALS Patient goal is to walk; Be able to strengthen my arms for better use  OBJECTIVE:   DIAGNOSTIC FINDINGS: Overall cognitive statusEXAM: DG HIP (WITH OR WITHOUT PELVIS) 2-3V LEFT; DG HIP (WITH OR WITHOUT PELVIS) 2-3V RIGHT   COMPARISON:  CT of the abdomen and pelvis 01/21/2020   FINDINGS: Left proximal femur fracture noted just below the intertrochanteric region. Marked ventral angulation noted on the cross-table image. Slight varus angulation  present   Comminuted intratrochanteric fracture present on the right also with marked ventral angulation.   IMPRESSION: 1. Bilateral hip fractures. 2. Comminuted intertrochanteric fracture of the right hip. 3. Angulated fracture of the proximal left femur just below the intertrochanteric region.     Electronically Signed   By: Marin Robertshristopher  Mattern M.D.   On: 08/15/2021 11:54:   COGNITION:  Within functional limits for tasks assessed   SENSATION: Light touch: Impaired   EDEMA:  None  Strength R/L 2-/2- Shoulder flexion (anterior deltoid/pec major/coracobrachialis, axillary n. (C5-6) and musculocutaneous n. (C5-7)) 2-/3+ (within available ROM around 130 on lef)Shoulder abduction (deltoid/supraspinatus, axillary/suprascapular n, C5) 3+/3+ Shoulder external rotation (infraspinatus/teres minor) 4/4 Shoulder internal rotation (subcapularis/lats/pec major) 3+/3+ Shoulder extension (posterior deltoid, lats, teres major, axillary/thoracodorsal n.) 3+/3+ Shoulder horizontal abduction 4/4 Elbow flexion (biceps brachii, brachialis, brachioradialis, musculoskeletal n, C5-6) 4/4 Elbow extension (triceps, radial n, C7) 2-/2- Wrist Extension 3+/3+ Wrist Flexion   LOWER EXTREMITY MMT:    MMT Right 01/11/22 Left 01/11/22  Hip flexion 2- 2-  Hip extension 2- 2-  Hip abduction 2- 2-  Hip adduction 2- 3  Hip internal rotation 2- 2-  Hip external rotation 2- 2-  Knee flexion 2- 2-  Knee extension 2+ 3+  Ankle dorsiflexion 2- NT - boot  Ankle plantarflexion 2- NT- boot  Ankle inversion 2- NT- boot  Ankle eversion 2- NT- boot  (Blank rows = not tested)  BED MOBILITY:  Unable to test due to NWB status of BLE- Patient currently dependent with all bed mobility using hoyer and caregiver assist  TRANSFERS: Currently BLE NWB- Dependent on use of hoyer for all transfers  FUNCTIONAL TESTs:   PATIENT SURVEYS:  FOTO 12  TODAY'S TREATMENT:  Patient was dependently transferred  from power chair to edge of mat via full electric Hoyer   Patient performed the following at edge of mat   THERE.EX:  Abdominal crunch to lumbar ext - x 15 reps  Thoracic rotation with arms across chest 2 sets of 15 reps Lumbar side bend x 15 reps each side AAROM- B hip flex 2 sets x 15 reps  AROM- LAQ- each LE 2 sets of 15 reps each Leg Eccentric control with Right LE x 6 reps B hip abd/add (resistive with RTB) x 10 reps   Resistive Hip add with ball squeeze x 5 sec hold -2 sets of 10 reps each LE  Pt educated throughout session about proper posture and technique with exercises. Improved exercise technique, movement at target joints, use of target muscles after min to mod verbal, visual, tactile cues.   PATIENT EDUCATION: Education details: PT plan of care/use of resistive bands Person educated: Patient Education method: Explanation, Demonstration, Tactile cues, and Verbal cues Education comprehension: verbalized understanding, returned demonstration, verbal cues required, tactile cues required, and needs further education   HOME EXERCISE PROGRAM: No changes at this time  Access Code: [facility] URL: [email or web address] Date: 11/23/2021 Prepared by: Precious Bard Exercises - Seated Gluteal Sets - 2 x daily - 7 x weekly - 2 sets - 10 reps - 5 hold - Seated Quad Set - 2 x daily - 7 x weekly - 2 sets - 10 reps - 5 hold - Seated Long Arc Quad - 2 x daily - 7 x weekly - 2 sets - 10 reps - 5 hold - Seated March - 2 x daily - 7 x weekly - 2 sets - 10 reps - 5 hold - Seated Hip Abduction - 2 x daily - 7 x weekly - 2 sets - 10 reps - 5 hold - Seated Shoulder Shrugs - 2 x daily - 7 x weekly - 2 sets - 10 reps - 5 hold - Wheelchair Pressure Relief - 2 x daily - 7 x weekly - 2 sets - 10 reps - 5 hold  GOALS: Goals reviewed with patient? Yes  SHORT TERM GOALS: Target date: 02/22/2022  Pt will be independent with initial UE strengthening HEP in order to improve strength and balance  in order to decrease fall risk and improve function at home and work. Baseline: 10/19/2021- patient with no formal UE HEP; 12/02/2021= Patient verbalized knowledge of HEP including use of theraband for UE strengthening.  Goal status: GOAL MET  LONG TERM GOALS: Target date: 06/30/2022  Pt will be independent with final for UE/LE HEP in order to improve strength and balance in order to decrease fall risk and improve function at home and work. Baseline: Patient is currently BLE NWB and unable to participate in HEP. Has order for UE strengthening. 01/11/2022- Patient now able to participate in LE strengthening although NWB still- good understanding for some basic exercises. Will  keep goal active to incorporate progressive LE strengthening exercises. 04/07/2022= Patient still participating in progressive LE seated HEP and has no questions at this time.  Goal status: MET  2.  Pt will improve FOTO to target score of 35  to display perceived improvements in ability to complete ADL's.  Baseline: 10/19/2021= 12; 12/02/2021= 17; 01/11/2022=17; 04/07/2022= 17 Goal status: Ongoing  3.  Pt will increase strength of B UE  by at least 1/2 MMT grade in order to demonstrate improvement in strength and function  Baseline: patient has range of 2-/5 to 4/5 BUE Strength; 12/02/2021= 4/5 except with wrist ext. 01/11/2022= 4/5 B UE strength expept for wrist Goal status: Partially met  4. Pt. Will increase strength of RLE by at least 1/2 MMT grade in order to demonstrate improvement in Standing/transfers.  Baseline: 2-/5 Right hip flex/knee ext/flex; 04/07/2022= 2- with right hip flex/knee ext (lacking 28 deg from zero) 4/8= Patient able to ext right knee lacking 18 deg from zero.   Goal status: Progressing  5. Pt. Will demo ability to stand pivot transfer with max assist for improved functional mobility and less dependent need on mechanical device.   Baseline: Dependent on hoyer lift for all transfers; 04/07/2022- Unable to test  secondary to patient with recent UTI and right hand procedure - will attempt to reassess next visit. ; 04/12/22: Unable to successfully stand due to feet plates on loaned power w/c; 05/10/2022- Patient still having to use rental chair and has not received her original power w/c back to practice SPT.   Goal status: ONGOING  6. Pt. Will demonstrate improved functional LE strength as seen by ability to stand > 2 min for improved transfer ability and pregait abilities.   Baseline: not assessed today due to recent dx: UTI; 04/07/2022- Unable to test secondary to patient with recent UTI and right hand procedure - will attempt to reassess next visit; 04/12/22: Unable to successfully stand due to feet plates on loaned power w/c4/09/2022- Patient still having to use rental chair and has not received her original power w/c back to practice Standing.   Goal status: ONGOING  ASSESSMENT:  CLINICAL IMPRESSION:  Patient performed well with dynamic sitting activities- able to maneuver well with dynamic reaching- Did have some trouble with not leaning back with LAQ due to muscle weakness. Patient reported increased fatigue and will benefit from continued work on [employer]. May attempt standing soon as appropriate.  Pt will continue to benefit from skilled PT services to optimize independence and reduced caregiver support.    OBJECTIVE IMPAIRMENTS Abnormal gait, decreased activity tolerance, decreased balance, decreased coordination, decreased endurance, decreased mobility, difficulty walking, decreased ROM, decreased strength, hypomobility, impaired flexibility, impaired UE functional use, postural dysfunction, and pain.   ACTIVITY LIMITATIONS carrying, lifting, bending, sitting, standing, squatting, sleeping, stairs, transfers, bed mobility, continence, bathing, toileting, dressing, self feeding, reach over head, hygiene/grooming, and caring for others  PARTICIPATION LIMITATIONS: meal prep, cleaning, laundry,  medication management, personal finances, interpersonal relationship, driving, shopping, community activity, and yard work  PERSONAL FACTORS Age, Time since onset of injury/illness/exacerbation, and 1-2 comorbidities: HTN, cervical Sx  are also affecting patient's functional outcome.   REHAB POTENTIAL: Good  CLINICAL DECISION MAKING: Evolving/moderate complexity  EVALUATION COMPLEXITY: Moderate  PLAN: PT FREQUENCY: 1-2x/week  PT DURATION: 12 weeks  PLANNED INTERVENTIONS: Therapeutic exercises, Therapeutic activity, Neuromuscular re-education, Balance training, Gait training, Patient/Family education, Self Care, Joint mobilization, DME instructions, Dry Needling, Electrical stimulation, Wheelchair mobility training, Spinal mobilization, Cryotherapy, Moist heat, and  Manual therapy  PLAN FOR NEXT SESSION: Continue with weight bearing activities, hoyer over to mat table and practice standing next visit and LE strengthening as appropriate.   Louis Meckel, PT Physical Therapist- [facility]  05/12/2022, 1:31 PM

## 2022-05-13 ENCOUNTER — Ambulatory Visit: Payer: Medicare HMO | Admitting: Occupational Therapy

## 2022-05-17 ENCOUNTER — Encounter: Payer: Self-pay | Admitting: Occupational Therapy

## 2022-05-17 ENCOUNTER — Ambulatory Visit: Payer: Medicare HMO

## 2022-05-17 ENCOUNTER — Ambulatory Visit: Payer: Medicare HMO | Admitting: Occupational Therapy

## 2022-05-17 DIAGNOSIS — R278 Other lack of coordination: Secondary | ICD-10-CM

## 2022-05-17 DIAGNOSIS — S72001A Fracture of unspecified part of neck of right femur, initial encounter for closed fracture: Secondary | ICD-10-CM

## 2022-05-17 DIAGNOSIS — M6281 Muscle weakness (generalized): Secondary | ICD-10-CM | POA: Diagnosis not present

## 2022-05-17 DIAGNOSIS — R2689 Other abnormalities of gait and mobility: Secondary | ICD-10-CM

## 2022-05-17 DIAGNOSIS — S14129S Central cord syndrome at unspecified level of cervical spinal cord, sequela: Secondary | ICD-10-CM

## 2022-05-17 DIAGNOSIS — R269 Unspecified abnormalities of gait and mobility: Secondary | ICD-10-CM

## 2022-05-17 DIAGNOSIS — R262 Difficulty in walking, not elsewhere classified: Secondary | ICD-10-CM

## 2022-05-17 LAB — CBC AND DIFFERENTIAL
HCT: 33 — AB (ref 36–46)
Hemoglobin: 10.7 — AB (ref 12.0–16.0)

## 2022-05-17 NOTE — Therapy (Signed)
OUTPATIENT PHYSICAL THERAPY NEURO TREATMENT   Patient Name: Sherry [name] MRN: [id number] DOB:December 20, 1934, 88 y.o., female Today's Date: 05/17/2022   PCP: Sherry Mealing, MD REFERRING PROVIDER: Deeann [name]   PT End of Session - 05/17/22 1051     Visit Number 42    Number of Visits 58    Date for PT Re-Evaluation 06/30/22    Authorization Time Period Initial cert 05/10/8117-14/[id number]/2956; Recert 01/11/2022-04/05/2022; Recert 04/07/2022-06/30/2022    Progress Note Due on Visit 50    PT Start Time 1145    PT Stop Time 1227    PT Time Calculation (min) 42 min    Equipment Utilized During Treatment Gait belt    Activity Tolerance Patient tolerated treatment well    Behavior During Therapy WFL for tasks assessed/performed                           Past Medical History:  Diagnosis Date   Acute blood loss anemia    Arthritis    Cancer    skin   Central cord syndrome at C6 level of cervical spinal cord 11/29/2017   Hypertension    Protein-calorie malnutrition, severe 01/24/2018   S/P insertion of IVC (inferior vena caval) filter 01/24/2018   Tetraparesis    Past Surgical History:  Procedure Laterality Date   ANTERIOR CERVICAL DECOMP/DISCECTOMY FUSION N/A 11/29/2017   Procedure: Cervical five-six, six-seven Anterior Cervical Decompression Fusion;  Surgeon: Sherry Pierini, MD;  Location: [facility] OR;  Service: Neurosurgery;  Laterality: N/A;  Cervical five-six, six-seven Anterior Cervical Decompression Fusion   CATARACT EXTRACTION     FEMUR IM NAIL Left 08/16/2021   Procedure: INTRAMEDULLARY (IM) NAIL FEMORAL;  Surgeon: Sherry Saint, MD;  Location: [facility] ORS;  Service: Orthopedics;  Laterality: Left;   IR IVC FILTER PLMT / S&I /IMG GUID/MOD SED  12/08/2017   Patient Active Problem List   Diagnosis Date Noted   Hypertensive heart disease with other congestive heart failure 02/14/2022   Chronic indwelling Foley catheter 02/14/2022   Closed hip fracture 08/15/2021    DVT (deep venous thrombosis) 08/15/2021   Quadriplegia 08/15/2021   Fall 08/15/2021   Constipation due to slow transit 08/31/2018   Trauma 06/05/2018   Neuropathic pain 06/05/2018   Neurogenic bowel 06/05/2018   Vaginal yeast infection 01/30/2018   Healthcare-associated pneumonia 01/25/2018   Chronic allergic rhinitis 01/24/2018   Depression with anxiety 01/24/2018   UTI due to Klebsiella species 01/24/2018   Protein-calorie malnutrition, severe 01/24/2018   S/P insertion of IVC (inferior vena caval) filter 01/24/2018   Tetraparesis 01/20/2018   Neurogenic bladder 01/20/2018   Reactive depression    Benign essential HTN    Acute postoperative anemia due to expected blood loss    Central cord syndrome at C6 level of cervical spinal cord 11/29/2017   Allergy to alpha-gal 11/25/2016   SCC (squamous cell carcinoma) 04/08/2014    ONSET DATE: 08/15/2021 (fall with B hip fx); Initial injury was in 2019- quadraplegia due to central cord syndrome of C6 secondary to neck fracture.   REFERRING DIAG: Bilateral Hip fx; Left Open- s/p Left IM nail femoral on 08/16/2021; Right closed  THERAPY DIAG:  Muscle weakness (generalized)  Other lack of coordination  Abnormality of gait and mobility  Difficulty in walking, not elsewhere classified  Other abnormalities of gait and mobility  Central cord syndrome, sequela  Closed fracture of both hips, initial encounter  Rationale for Evaluation and Treatment Rehabilitation  SUBJECTIVE:  SUBJECTIVE STATEMENT:    Patient reports doing well without any new complaints. States she was minimally sore in trunk and LE after last visit.    Pt accompanied by:  Paid caregiver-   PERTINENT HISTORY: Patient was previous patient (see previous episode for specifics) She experienced a fall at home on 08/15/2021 and was in Hospital from 08/15/2021- 08/20/2021.  Most recent history per Sherry [name] on 08/20/2021.  Sherry [name] is a 88 y.o. female  with medical history significant of quadriplegia due to central cord syndrome of C6 secondary to neck fracture, hypertension, depression with anxiety, neurogenic bladder, bilateral DVT on Xarelto (s/p of IVC filter placement), who presents with fall and hip pain.   Patient has history of quadriplegia, and has been doing PT/OT with improvement of function. Pt has some use of both arms and can feed herself. She can walk a little bit with assistance. Pt accidentally fell at about 930 this morning after taking shower.  She injured her hips. Hospital Course:    Closed left hip fracture [facility]) s/p mechanical fall Closed right hip fracture (?old) Acute blood loss anemia --pt is s/p  INTRAMEDULLARY (IM) NAIL FEMORAL-left on 7/16  PAIN:  Are you having pain? NO: NPRS scale: 0/10 Pain location: Right hip Pain description: Ache Aggravating factors: any active Movement of right side Relieving factors: Rest  PRECAUTIONS: Fall  WEIGHT BEARING RESTRICTIONS Yes Bilateral non-weight bearing B LE's  FALLS: Has patient fallen in last 6 months? Yes. Number of falls 1  LIVING ENVIRONMENT: Lives with: lives with their family and lives in a nursing home Lives in: Other Long term care at [facility] Stairs: No Has following equipment at home: Wheelchair (power), Ramped entry, and hoyer lift  PLOF: Needs assistance with ADLs, Needs assistance with homemaking, Needs assistance with gait, Needs assistance with transfers, and Leisure: Work puzzles, read, Watch TV  PATIENT GOALS Patient goal is to walk; Be able to strengthen my arms for better use  OBJECTIVE:   DIAGNOSTIC FINDINGS: Overall cognitive statusEXAM: DG HIP (WITH OR WITHOUT PELVIS) 2-3V LEFT; DG HIP (WITH OR WITHOUT PELVIS) 2-3V RIGHT   COMPARISON:  CT of the abdomen and pelvis 01/21/2020   FINDINGS: Left proximal femur fracture noted just below the intertrochanteric region. Marked ventral angulation noted on the cross-table image. Slight  varus angulation present   Comminuted intratrochanteric fracture present on the right also with marked ventral angulation.   IMPRESSION: 1. Bilateral hip fractures. 2. Comminuted intertrochanteric fracture of the right hip. 3. Angulated fracture of the proximal left femur just below the intertrochanteric region.     Electronically Signed   By: Marin Roberts M.D.   On: 08/15/2021 11:54:   COGNITION:  Within functional limits for tasks assessed   SENSATION: Light touch: Impaired   EDEMA:  None  Strength R/L 2-/2- Shoulder flexion (anterior deltoid/pec major/coracobrachialis, axillary n. (C5-6) and musculocutaneous n. (C5-7)) 2-/3+ (within available ROM around 130 on lef)Shoulder abduction (deltoid/supraspinatus, axillary/suprascapular n, C5) 3+/3+ Shoulder external rotation (infraspinatus/teres minor) 4/4 Shoulder internal rotation (subcapularis/lats/pec major) 3+/3+ Shoulder extension (posterior deltoid, lats, teres major, axillary/thoracodorsal n.) 3+/3+ Shoulder horizontal abduction 4/4 Elbow flexion (biceps brachii, brachialis, brachioradialis, musculoskeletal n, C5-6) 4/4 Elbow extension (triceps, radial n, C7) 2-/2- Wrist Extension 3+/3+ Wrist Flexion   LOWER EXTREMITY MMT:    MMT Right 01/11/22 Left 01/11/22  Hip flexion 2- 2-  Hip extension 2- 2-  Hip abduction 2- 2-  Hip adduction 2- 3  Hip internal rotation 2- 2-  Hip external rotation 2- 2-  Knee flexion 2- 2-  Knee extension 2+ 3+  Ankle dorsiflexion 2- NT - boot  Ankle plantarflexion 2- NT- boot  Ankle inversion 2- NT- boot  Ankle eversion 2- NT- boot  (Blank rows = not tested)  BED MOBILITY:  Unable to test due to NWB status of BLE- Patient currently dependent with all bed mobility using hoyer and caregiver assist  TRANSFERS: Currently BLE NWB- Dependent on use of hoyer for all transfers  FUNCTIONAL TESTs:   PATIENT SURVEYS:  FOTO 12  TODAY'S TREATMENT:  Patient was  transferred from power w/c to edge of mat in sitting via hoyer with +2 person assist   THERE.EX:   Trunk side bend (Forearm to mat) x 15 reps each side Abdominal crunch to lumbar ext - x 15 reps  Thoracic rotation with arms across chest 2 sets of 15 reps Dynamic boxer cross punch x 15 each side.                      AAROM- B hip flex 2 sets x 15 reps  AROM- LAQ- each LE 2 sets of 15 reps each Leg B Hip abd AAROM 2 sets  x 15 reps   Active ham curl on left and AAROM On right- 2 sets of 15 reps   Passive knee flex stretch on left with prolonged hold- 45 sec x 4.   Pt educated throughout session about proper posture and technique with exercises. Improved exercise technique, movement at target joints, use of target muscles after min to mod verbal, visual, tactile cues.   PATIENT EDUCATION: Education details: PT plan of care/use of resistive bands Person educated: Patient Education method: Explanation, Demonstration, Tactile cues, and Verbal cues Education comprehension: verbalized understanding, returned demonstration, verbal cues required, tactile cues required, and needs further education   HOME EXERCISE PROGRAM: No changes at this time  Access Code: [facility] URL: [email or web address] Date: 11/23/2021 Prepared by: Precious Bard Exercises - Seated Gluteal Sets - 2 x daily - 7 x weekly - 2 sets - 10 reps - 5 hold - Seated Quad Set - 2 x daily - 7 x weekly - 2 sets - 10 reps - 5 hold - Seated Long Arc Quad - 2 x daily - 7 x weekly - 2 sets - 10 reps - 5 hold - Seated March - 2 x daily - 7 x weekly - 2 sets - 10 reps - 5 hold - Seated Hip Abduction - 2 x daily - 7 x weekly - 2 sets - 10 reps - 5 hold - Seated Shoulder Shrugs - 2 x daily - 7 x weekly - 2 sets - 10 reps - 5 hold - Wheelchair Pressure Relief - 2 x daily - 7 x weekly - 2 sets - 10 reps - 5 hold  GOALS: Goals reviewed with patient? Yes  SHORT TERM GOALS: Target date: 02/22/2022  Pt will be independent with  initial UE strengthening HEP in order to improve strength and balance in order to decrease fall risk and improve function at home and work. Baseline: 10/19/2021- patient with no formal UE HEP; 12/02/2021= Patient verbalized knowledge of HEP including use of theraband for UE strengthening.  Goal status: GOAL MET  LONG TERM GOALS: Target date: 06/30/2022  Pt will be independent with final for UE/LE HEP in order to improve strength and balance in order to decrease fall risk and improve function at home and work. Baseline: Patient is currently  BLE NWB and unable to participate in HEP. Has order for UE strengthening. 01/11/2022- Patient now able to participate in LE strengthening although NWB still- good understanding for some basic exercises. Will keep goal active to incorporate progressive LE strengthening exercises. 04/07/2022= Patient still participating in progressive LE seated HEP and has no questions at this time.  Goal status: MET  2.  Pt will improve FOTO to target score of 35  to display perceived improvements in ability to complete ADL's.  Baseline: 10/19/2021= 12; 12/02/2021= 17; 01/11/2022=17; 04/07/2022= 17 Goal status: Ongoing  3.  Pt will increase strength of B UE  by at least 1/2 MMT grade in order to demonstrate improvement in strength and function  Baseline: patient has range of 2-/5 to 4/5 BUE Strength; 12/02/2021= 4/5 except with wrist ext. 01/11/2022= 4/5 B UE strength expept for wrist Goal status: Partially met  4. Pt. Will increase strength of RLE by at least 1/2 MMT grade in order to demonstrate improvement in Standing/transfers.  Baseline: 2-/5 Right hip flex/knee ext/flex; 04/07/2022= 2- with right hip flex/knee ext (lacking 28 deg from zero) 4/8= Patient able to ext right knee lacking 18 deg from zero.   Goal status: Progressing  5. Pt. Will demo ability to stand pivot transfer with max assist for improved functional mobility and less dependent need on mechanical device.   Baseline:  Dependent on hoyer lift for all transfers; 04/07/2022- Unable to test secondary to patient with recent UTI and right hand procedure - will attempt to reassess next visit. ; 04/12/22: Unable to successfully stand due to feet plates on loaned power w/c; 05/10/2022- Patient still having to use rental chair and has not received her original power w/c back to practice SPT.   Goal status: ONGOING  6. Pt. Will demonstrate improved functional LE strength as seen by ability to stand > 2 min for improved transfer ability and pregait abilities.   Baseline: not assessed today due to recent dx: UTI; 04/07/2022- Unable to test secondary to patient with recent UTI and right hand procedure - will attempt to reassess next visit; 04/12/22: Unable to successfully stand due to feet plates on loaned power w/c4/09/2022- Patient still having to use rental chair and has not received her original power w/c back to practice Standing.   Goal status: ONGOING  ASSESSMENT:  CLINICAL IMPRESSION:  Patient continues to perform well in unsupported sitting position- did exhibit some fatigue with extended time- + upright today. She exhibited good trunk mobility and ability to reach well with Left LE. She was weaker with LAQ and hip flex in this position as she was unable to extend back and use some Low back compensation.  Pt will continue to benefit from skilled PT services to optimize independence and reduced caregiver support.    OBJECTIVE IMPAIRMENTS Abnormal gait, decreased activity tolerance, decreased balance, decreased coordination, decreased endurance, decreased mobility, difficulty walking, decreased ROM, decreased strength, hypomobility, impaired flexibility, impaired UE functional use, postural dysfunction, and pain.   ACTIVITY LIMITATIONS carrying, lifting, bending, sitting, standing, squatting, sleeping, stairs, transfers, bed mobility, continence, bathing, toileting, dressing, self feeding, reach over head, hygiene/grooming,  and caring for others  PARTICIPATION LIMITATIONS: meal prep, cleaning, laundry, medication management, personal finances, interpersonal relationship, driving, shopping, community activity, and yard work  PERSONAL FACTORS Age, Time since onset of injury/illness/exacerbation, and 1-2 comorbidities: HTN, cervical Sx  are also affecting patient's functional outcome.   REHAB POTENTIAL: Good  CLINICAL DECISION MAKING: Evolving/moderate complexity  EVALUATION COMPLEXITY: Moderate  PLAN:  PT FREQUENCY: 1-2x/week  PT DURATION: 12 weeks  PLANNED INTERVENTIONS: Therapeutic exercises, Therapeutic activity, Neuromuscular re-education, Balance training, Gait training, Patient/Family education, Self Care, Joint mobilization, DME instructions, Dry Needling, Electrical stimulation, Wheelchair mobility training, Spinal mobilization, Cryotherapy, Moist heat, and Manual therapy  PLAN FOR NEXT SESSION: Continue with weight bearing activities, hoyer over to mat table and practice standing next visit and LE strengthening as appropriate.   Louis Meckel, PT Physical Therapist- [facility]  05/17/2022, 1:12 PM

## 2022-05-17 NOTE — Therapy (Signed)
Occupational Therapy Treatment Note      Patient Name: Sherry [name] MRN: [id number] DOB:[birth year], 88 y.o., female Today's Date: 05/17/2022                                         PCP: Dr. Tillman Abide REFERRING PROVIDER: Dr. Tillman Abide   OT End of Session - 05/17/22 1103     Visit Number 48 (P)     Number of Visits 72 (P)     Date for OT Re-Evaluation 06/30/22 (P)     OT Start Time 1100 (P)     OT Stop Time 1145 (P)     OT Time Calculation (min) 45 min (P)     Activity Tolerance Patient tolerated treatment well (P)     Behavior During Therapy WFL for tasks assessed/performed (P)                   Past Medical History:  Diagnosis Date   Acute blood loss anemia    Arthritis    Cancer    skin   Central cord syndrome at C6 level of cervical spinal cord 11/29/2017   Hypertension    Protein-calorie malnutrition, severe 01/24/2018   S/P insertion of IVC (inferior vena caval) filter 01/24/2018   Tetraparesis    Past Surgical History:  Procedure Laterality Date   ANTERIOR CERVICAL DECOMP/DISCECTOMY FUSION N/A 11/29/2017   Procedure: Cervical five-six, six-seven Anterior Cervical Decompression Fusion;  Surgeon: Jadene Pierini, MD;  Location: [facility] OR;  Service: Neurosurgery;  Laterality: N/A;  Cervical five-six, six-seven Anterior Cervical Decompression Fusion   CATARACT EXTRACTION     FEMUR IM NAIL Left 08/16/2021   Procedure: INTRAMEDULLARY (IM) NAIL FEMORAL;  Surgeon: Deeann Saint, MD;  Location: [facility] ORS;  Service: Orthopedics;  Laterality: Left;   IR IVC FILTER PLMT / S&I /IMG GUID/MOD SED  12/08/2017   Patient Active Problem List   Diagnosis Date Noted   Hypertensive heart disease with other congestive heart failure 02/14/2022   Chronic indwelling Foley catheter 02/14/2022   Closed hip fracture 08/15/2021   DVT (deep venous thrombosis) 08/15/2021   Quadriplegia 08/15/2021   Fall 08/15/2021   Constipation due to slow transit 08/31/2018   Trauma  06/05/2018   Neuropathic pain 06/05/2018   Neurogenic bowel 06/05/2018   Vaginal yeast infection 01/30/2018   Healthcare-associated pneumonia 01/25/2018   Chronic allergic rhinitis 01/24/2018   Depression with anxiety 01/24/2018   UTI due to Klebsiella species 01/24/2018   Protein-calorie malnutrition, severe 01/24/2018   S/P insertion of IVC (inferior vena caval) filter 01/24/2018   Tetraparesis 01/20/2018   Neurogenic bladder 01/20/2018   Reactive depression    Benign essential HTN    Acute postoperative anemia due to expected blood loss    Central cord syndrome at C6 level of cervical spinal cord 11/29/2017   Allergy to alpha-gal 11/25/2016   SCC (squamous cell carcinoma) 04/08/2014    REFERRING DIAG: Central Cord Syndrome at C6 of the Cervical Spinal Cord, Fall with Bilateral Closed Hip Fractures, with ORIF repair with Intramedullary Nailing of the Left Hip Fracture.  THERAPY DIAG:  Other lack of coordination  Muscle weakness (generalized)  Rationale for Evaluation and Treatment Rehabilitation  PERTINENT HISTORY: Patient s/p fall November 29, 2017 resulting in diagnosis of central cord syndrome at C 6 level.  She has had therapy in multiple venues but with recent move  to [facility], therapy staff recommended she seek outpatient therapy for her needs. Pt. Had a recent fall with Bilateral Closed Hip Fractures, with ORIF repair with Intramedullary Nailing of the Left Hip Fracture.   PRECAUTIONS: None  SUBJECTIVE:   Pt. reports doing well today.  PAIN:  Are you having pain? No  OBJECTIVE:    Therapeutic Ex.  Pt. tolerated PROM followed by AROM bilateral wrist extension, PROM for bilateral digit MP, PIP, and DIP flexion, and extension, thumb radial, and palmar abduction. 1 set x 30 reps no weight 3 direction punches overhead/lateral/forward, assist to achieve full shoulder flexion. 20 reps scapular retraction and elbow flexion. Pt worked on [employer] using the UBE for  reciprocal motion, therapist present to adjust settings and technique, tolerated 10 min in siting, alternating 2 min forward, 2 min backward.  Therapeutic Activity Pt. worked on reaching for [employer], and moving them through first 3 levels of horizontal rungs placed at progressively higher heights. Completed with L and R hands x2 trials.     PATIENT EDUCATION: RUE Distal ROM, and strength Person educated: Patient Education method: Explanation and Demonstration Education comprehension: verbalized understanding, returned demonstration, and needs further education   HOME EXERCISE PROGRAM Continue with ongoing HEPs  Measurements:  12/09/2021:  Shoulder flexion: R: 120(136), L: 138(150) Shoulder abduction: R: 108(120), L: 110(120) Elbow: R: 0-148, L: 0-146 Wrist flexion: R: 55(62), L: 78 Writ extension: R: 34(55), L: 3(4) RD: R: 12(20), L: 16(26) UD: R: 8(24), L: 6(18) Thumb radial abduction: R: 11(20), L: 8(20) Digit flexion to the [facility]: R:  2nd: 5cm(3.5cm), 3rd: 7.5 cm(5cm), 4th: 7cm(3cm), 5th: 6cm(5.5cm) L: 2nd: 8cm(8cm), 3rd: 8cm(8cm), 4th: 7.5cm(7cm), 5th: 7cm(7cm)  02/03/2022:   Shoulder flexion: R: 122(138), L: 148(154) Shoulder abduction: R: 109(125), L: 125(125 Elbow: R: 0-148, L: 0-146 Wrist flexion: R: 60(68), L: 79 Writ extension: R: 40(55), L: 3(10) RD: R: 14(24), L: 20(28) UD: R: 8(24), L: 6(18) Thumb radial abduction: R: 20(25), L: 8(20) Digit flexion to the [facility]: R:  2nd: 4.5cm(3cm), 3rd: 6.5cm(4.5cm), 4th: 6.5 cm(3cm), 5th: 4.5cm(5cm) L: 2nd: 8cm(8cm), 3rd: 8cm(7cm), 4th: 7.5cm(7cm), 5th: 6.5cm(6.5cm)   04/07/2022:   Shoulder flexion: R: 125(150), L: 160(160) Shoulder abduction: R: 109(125), L: 125(125 Elbow: R: 0-148, L: 0-146 Wrist flexion: R: 60(68), L: 79 Writ extension: R: 40(55), L: 5(18) RD: R: 14(24), L: 20(28) UD: R: 18(24), L: 8(18) Thumb radial abduction: R: 20(25), L: 8(20) Digit flexion to the [facility]: R:  2nd: 4 cm(4cm), 3rd: 6.5cm(4cm),  4th: 7 cm (5cm), 5th: 6cm(5cm) L: 2nd: 8cm(8cm), 3rd: 8cm(7cm), 4th: 7.5cm(7cm), 5th: 7cm(7cm)           OT Long Term Goals - 07/06/21 1106                                                                                          Target Date: 06/30/2022    OT LONG TERM GOAL #1   Title Patient and caregiver will demonstrate understanding of home exercise program for ROM.    Baseline 12/09/2021: Pt. Reports the restorative ROM program has not resumed at the facility. 10/28/2021: Pt. Is attempting exercises/stretches at home.  The facility restorative aides have not resumed exercises. 09/14/2021:  Pt. Reports inconsistencies with restorative ROM at  her care home. Pt/caregiver education to be provided about ongoing ROM needs.    Time 12    Period Weeks    Status Deferred     OT LONG TERM GOAL #2   Title Pt.  will improve BUE strength to be able to sustain her UE's in elevation to efficiently, and effectively perform hair care, and self-grooming tasks.   Baseline 02/03/2022: Pt. has improved, and is now able to sustain her Bilateral UEs in elevation long enough to efficiently, and effectively perform haircare, and self-grooming tasks. 12/09/2021: right shoulder 4-/5, left shoulder 4/5. Pt. has improved to be able to reach up for hair care, and self-grooming tasks. 10/28/2021: Right shoulder 3+/5, Left shoulder 4-/5 09/14/2021:  BUE shoulder strength: 3+/5 for shoulder flexion, and abduction.   Time 12    Period Weeks    Status Achieved               OT LONG TERM GOAL #3   Title Patient will demonstrate improved composite finger flexion to hold deodorant to apply to underarms.    Baseline 04/07/2022: Pt. Has improved with right 2nd, and 3rd digit flexion towards the [facility] Pt. Continues to have difficulty securely holding and applying deodorant.02/03/2022: Pt. Has improved with digit flexion, however continues to have difficulty securely holding and using deoderant. 12/09/2021: Bilateral hand/digit MP,  PIP, and DIP extension tightness limits her ability achieve digit flexion to hold, and apply deodorant. 10/28/2021:  Pt. Continues to have difficulty holding the deodorant. 01/12/2021: Pt. Presents with limited digit extension. Pt. Is able to initiate holding deodorant, however is unable to hold it while using it.   Time 12    Period Weeks    Status Ongoing   Target Date                  OT LONG TERM GOAL #4   Title Pt. Will improve bilateral wrist extension in preparation for anticipating, and initiating reaching for objects at the table.    Baseline 04/07/2022: Rght: 40(55) Left: 5(18) 02/03/2022: Right: 34(55) Left: 3(4) 12/09/2021: Right  34(55)  Left  3(4) 10/28/2021:  Right 22(38), Left 0(15) 09/14/2021:  Right: 17(35), left 2(15)   Time 12   Period weeks   Status Ongoing   Target Date      OT LONG TERM GOAL #5   Title Pt. will write, and sign her name with 100% legibility, and modified independence.    Baseline 04/07/2022: Pt. Reports that she continues to present with shaky appearing writing. Pt.reports that she is writing more when filling out her menu, and complete puzzles 02/03/2022: Pt. Continues to report legibility appears "shaky" when attempting signing her name. Pt.reports that she is writing more when filling out her menu, and complete puzzles.12/09/2021: Pt. Reports legibility appears "shaky" when attempting signing her name. Pt.reports that she is writing more when filling out her menu. 10/28/2021: pt. is able to sign her name, however legibility continues to be inconsistent. Pt. Reports "shaky appearance". 09/14/2021: Pt. continues to fill out her daily menu and complete puzzles. Pt. Continues to present with limited writing legibility.   Time 12    Period Weeks    Status  Ongoing         OT LONG TERM GOAL  #6   TITLE Pt. will turn pages in a book with modified independence.    Baseline 02/03/2022: Pt.  Is able to effectively turn pages of a book. 10/28/2021: Pt. Is able to  consistently turn book pages. 09/14/2021: Pt. has difficulty turning pages.    Time 12    Period Weeks    Status Achieved       OT LONG TERM GOAL  #7  TITLE Pt. Will increase  bilateral lateral pinch strength by 2 lbs to be able to securely grasp items during ADLs, and IADL tasks.   Baseline 04/07/2022: NT-Pinch meter out for calibration. 02/03/2022: Right: 6#, Left: 4# 12/09/2021:  Right: 6#, Left: 4#   Time 12  Period Weeks  Status Ongoing           OT LONG TERM GOAL  #8  TITLE Pt. Will button a sweater with large 1" buttons, and zip her jacket with modified independence.   Baseline 04/07/2022: Pt. Has difficulty manipulating buttons, however was introduced to a buttonhook. Pt. Was able to hold, and use a buttonhook for buttons on a shirt. 02/03/2022: Pt. is unable to manipulate buttons on her sweater, or zippers on her jacket.  Time 12  Period Weeks  Status Ongoing   OT LONG TERM GOAL  #9  TITLE Pt. will complete plant care with modified independence.  Baseline 04/07/2022: Pt. is now able to hold a cup and water her plants.02/03/2022: Pt. has difficulty caring for her plants.   Time 12  Period Weeks  Status Ongoing   OT LONG TERM GOAL  #10  TITLE Pt. will demonstrate adaptive techniques to assist with the efficiency of self-dressing, or morning care tasks.  Baseline 04/07/2022: Pt. Continues to require assist with the efficiency of self-dressing,a nd morning care tasks.02/03/2022: Pt. requires assist from caregivers 2/2 time limitations during morning care.   Time 12  Period Weeks  Status Ongoing     Plan - 07/22/21 1737     Clinical Impression Statement Pt. reports that her left shoulder, and arm continues to feel good, with no reports of discomfort following the addition of the UBE. Tolerated 20 reps of AROM exercises with no weight. Performed [place] rings through 3 level.  Pt. continues to present with tightness at the bilateral thumb webspace, and bilateral wrist flexor  tightness, as well as digit MP, PIP, and DIP extensor tightness which continues to limit her ability to complete ADL tasks efficiently. Pt. continues to benefit from working on impoving ROM in order to work towards increasing bilateral hand grasp on objects, and increase engagement of bilateral hands during ADLs, and IADL tasks.                 OT Occupational Profile and History Detailed Assessment- Review of Records and additional review of physical, cognitive, psychosocial history related to current functional performance    Occupational performance deficits (Please refer to evaluation for details): ADL's;IADL's;Leisure;Social Participation    Body Structure / Function / Physical Skills ADL;Continence;Dexterity;Flexibility;Strength;ROM;Balance;Coordination;FMC;IADL;Endurance;UE functional use;Decreased knowledge of use of DME;[facility]    Psychosocial Skills Coping Strategies    Rehab Potential Fair    Clinical Decision Making Several treatment options, min-mod task modification necessary    Comorbidities Affecting Occupational Performance: Presence of comorbidities impacting occupational performance    Comorbidities impacting occupational performance description: contractures of bilateral hands, dependent transfers,    Modification or Assistance to Complete Evaluation  No modification of tasks or assist necessary to complete eval    OT Frequency 2x / week    OT Duration 12 weeks    OT Treatment/Interventions Self-care/ADL training;Cryotherapy;Therapeutic exercise;DME and/or AE  instruction;Balance training;Neuromuscular education;Manual Therapy;Splinting;Moist Heat;Passive range of motion;Therapeutic activities;Patient/family education    Plan continue to progress ROM of digits, wrists and shoulders as it pertains to completion of ADL tasks that pt values.    Consulted and Agree with Plan of Care Patient            Kathie Dike, M.S. OTR/L  05/17/22, 11:05 AM  ascom [phone]   Presley Raddle, OT 05/17/2022, 11:04 AM      `

## 2022-05-19 ENCOUNTER — Ambulatory Visit: Payer: Medicare HMO

## 2022-05-19 ENCOUNTER — Ambulatory Visit: Payer: Medicare HMO | Admitting: Occupational Therapy

## 2022-05-20 ENCOUNTER — Non-Acute Institutional Stay (SKILLED_NURSING_FACILITY): Payer: Medicare HMO | Admitting: Nurse Practitioner

## 2022-05-20 ENCOUNTER — Encounter: Payer: Self-pay | Admitting: Nurse Practitioner

## 2022-05-20 ENCOUNTER — Ambulatory Visit: Payer: Medicare HMO | Admitting: Occupational Therapy

## 2022-05-20 DIAGNOSIS — N319 Neuromuscular dysfunction of bladder, unspecified: Secondary | ICD-10-CM | POA: Diagnosis not present

## 2022-05-20 DIAGNOSIS — T148XXA Other injury of unspecified body region, initial encounter: Secondary | ICD-10-CM

## 2022-05-20 DIAGNOSIS — I10 Essential (primary) hypertension: Secondary | ICD-10-CM

## 2022-05-20 DIAGNOSIS — M810 Age-related osteoporosis without current pathological fracture: Secondary | ICD-10-CM

## 2022-05-20 DIAGNOSIS — G8250 Quadriplegia, unspecified: Secondary | ICD-10-CM

## 2022-05-20 DIAGNOSIS — I824Y3 Acute embolism and thrombosis of unspecified deep veins of proximal lower extremity, bilateral: Secondary | ICD-10-CM

## 2022-05-20 DIAGNOSIS — K5901 Slow transit constipation: Secondary | ICD-10-CM

## 2022-05-20 NOTE — Progress Notes (Signed)
Location:  Other [facility].  Nursing Home Room Number: [facility] 219A Place of Service:  SNF (31) [facility]  PCP: Earnestine Mealing, MD  Patient Care Team: Earnestine Mealing, MD as PCP - General (Family Medicine)  Extended Emergency Contact Information Primary Emergency Contact: [name],[name] Mobile Phone: [phone] Relation: Daughter Secondary Emergency Contact: Thuy, Atilano Address: [address]          [place], [place] [address] [place] of [place] Home Phone: [phone] Mobile Phone: [phone] Relation: Spouse  Goals of care: Advanced Directive information    05/20/2022    9:05 AM  Advanced Directives  Does Patient Have a Medical Advance Directive? Yes  Type of Estate agent of [place];Out of facility DNR (pink MOST or yellow form);Living will  Does patient want to make changes to medical advance directive? No - Patient declined  Copy of Healthcare Power of Attorney in Chart? Yes - validated most recent copy scanned in chart (See row information)     Chief Complaint  Patient presents with   Medical Management of Chronic Issues    Medical Management of Chronic Issues.     HPI:  Pt is a 88 y.o. female seen today for medical management of chronic disease.  Pt had large hematoma in left arm after getting arm caught in the door.  She reports it has healed well- no pain at this time Her hgb dropped but has since improved and now close to baseline.   Calcium level low on last labs.   Continues with chronic cath due to neurogenic bladder  Constipation  Goal in therapy is to stand up and walk but she had a fall and then arm injury. Now she is doing strength training in her leg. She would like to go home- she has been in SNF/rehab since April 2022.   Bowels moving well with her medication.   Needing a new power chair- the agent spoke with the therapist at the hospital.  [employer] is the company she is working with.   She needs signatures on some of her documents- and will have those documents coming through.  She needs the power wheelchair to maintain her independence. She can not push herself in a regular wheelchair yet able to use joystick    Past Medical History:  Diagnosis Date   Acute blood loss anemia    Arthritis    Cancer    skin   Central cord syndrome at C6 level of cervical spinal cord 11/29/2017   Hypertension    Protein-calorie malnutrition, severe 01/24/2018   S/P insertion of IVC (inferior vena caval) filter 01/24/2018   Tetraparesis    Past Surgical History:  Procedure Laterality Date   ANTERIOR CERVICAL DECOMP/DISCECTOMY FUSION N/A 11/29/2017   Procedure: Cervical five-six, six-seven Anterior Cervical Decompression Fusion;  Surgeon: Jadene Pierini, MD;  Location: [facility] OR;  Service: Neurosurgery;  Laterality: N/A;  Cervical five-six, six-seven Anterior Cervical Decompression Fusion   CATARACT EXTRACTION     FEMUR IM NAIL Left 08/16/2021   Procedure: INTRAMEDULLARY (IM) NAIL FEMORAL;  Surgeon: Deeann Saint, MD;  Location: [facility] ORS;  Service: Orthopedics;  Laterality: Left;   IR IVC FILTER PLMT / S&I /IMG GUID/MOD SED  12/08/2017    Allergies  Allergen Reactions   Other Swelling   Lisinopril-Hydrochlorothiazide Swelling    Swelling of the tongue   Beef-Derived Products Swelling    Facial and lip swelling (due to tick bite)   Dairy Aid [Tilactase] Swelling  Lisinopril-Hydrochlorothiazide Swelling    Tongue swelling   Pork-Derived Products Swelling    Outpatient Encounter Medications as of 05/20/2022  Medication Sig   acetaminophen (TYLENOL) 500 MG tablet Take 500 mg by mouth every 12 (twelve) hours as needed for mild pain.   acetaminophen (TYLENOL) 650 MG CR tablet Take 650 mg by mouth every 8 (eight) hours as needed for pain.   acetic acid 0.25 % irrigation Use 30ml via irrigation twice daily to keep foley Cath patent.   alendronate (FOSAMAX) 70 MG tablet Take 70 mg  by mouth every Saturday.   calcium carbonate (CALCIUM 600) 600 MG TABS tablet Take 600 mg by mouth at bedtime.   Capsaicin 0.1 % CREA Apply 1 application  topically every evening. (Apply to hands)   carboxymethylcellulose (REFRESH PLUS) 0.5 % SOLN Place 2 drops into both eyes every 6 (six) hours as needed (dry eyes).   cetirizine (ZYRTEC) 5 MG tablet Take 5 mg by mouth daily.   cholecalciferol (VITAMIN D3) 25 MCG (1000 UNIT) tablet Take 1,000 Units by mouth daily.   DULoxetine (CYMBALTA) 60 MG capsule Take 60 mg by mouth daily.   EPINEPHrine 0.3 mg/0.3 mL IJ SOAJ injection Inject 0.3 mg into the muscle as needed for anaphylaxis.   ferrous sulfate 325 (65 FE) MG tablet Take 325 mg by mouth 2 (two) times daily with a meal.   gabapentin (NEURONTIN) 100 MG capsule Take 1 capsule (100 mg total) by mouth daily.   guaiFENesin (MUCINEX) 600 MG 12 hr tablet Take 600 mg by mouth 2 (two) times daily as needed for to loosen phlegm.   Infant Care Products [facility]) OINT Apply to buttocks/gluteal folds topically every shift.   lactulose, encephalopathy, (CHRONULAC) 10 GM/15ML SOLN Take 30 mLs by mouth daily as needed (mild constipation).   magnesium citrate solution Take 60-120 mLs by mouth daily as needed (constipation).   methylcellulose oral powder Take 1 packet by mouth at bedtime.   mometasone (ELOCON) 0.1 % lotion Apply 1 Application topically every Saturday at 6 PM. (Apply to the ear canal for eczema)   Multiple Vitamin (MULTIVITAMIN WITH MINERALS) TABS tablet Take 1 tablet by mouth daily.   polyethylene glycol (MIRALAX / GLYCOLAX) 17 g packet Take 17 g by mouth 2 (two) times daily.   rivaroxaban (XARELTO) 10 MG TABS tablet Take 10 mg by mouth daily.   saccharomyces boulardii (FLORASTOR) 250 MG capsule Take 250 mg by mouth daily.   Saline Spray 0.2 % SOLN Place 2 sprays into both nostrils as needed (nasal congestion).   simethicone (MYLICON) 80 MG chewable tablet Chew 80 mg by mouth every 6 (six)  hours as needed for flatulence.   sodium phosphate (FLEET) 7-19 GM/118ML ENEM Place 1 enema rectally every other day as needed for severe constipation.   spironolactone (ALDACTONE) 25 MG tablet Take 1 tablet (25 mg total) by mouth daily.   tiZANidine (ZANAFLEX) 2 MG tablet Take 1 tablet (2 mg total) by mouth 2 (two) times daily.   triamcinolone cream (KENALOG) 0.1 % Apply to thigh and lower legs topically every 8 hours as needed. Apply to back of neck topically as needed.   [DISCONTINUED] rivaroxaban (XARELTO) 20 MG TABS tablet Take 1 tablet (20 mg total) by mouth daily with supper. (Patient not taking: Reported on 04/23/2022)   No facility-administered encounter medications on file as of 05/20/2022.    Review of Systems  Constitutional:  Negative for activity change, appetite change, fatigue and unexpected weight change.  HENT:  Negative for congestion and hearing loss.   Eyes: Negative.   Respiratory:  Negative for cough and shortness of breath.   Cardiovascular:  Negative for chest pain, palpitations and leg swelling.  Gastrointestinal:  Negative for abdominal pain, constipation and diarrhea.  Genitourinary:  Positive for difficulty urinating (has chronic foley). Negative for dysuria.  Musculoskeletal:  Negative for arthralgias and myalgias.  Skin:  Negative for color change and wound.  Neurological:  Positive for weakness. Negative for dizziness.  Psychiatric/Behavioral:  Negative for agitation, behavioral problems and confusion.      Immunization History  Administered Date(s) Administered   Influenza, High Dose Seasonal PF 11/17/2021   Influenza-Unspecified 11/14/2018, 11/20/2019, 11/17/2020, 11/17/2021   Moderna Covid-19 Vaccine Bivalent Booster 51yrs & up 06/30/2021, 12/11/2021   Moderna Sars-Covid-2 Vaccination 02/09/2019, 03/09/2019, 12/14/2019, 06/20/2020, 10/24/2020   PPD Test 05/16/2019   Tdap 11/29/2017   Pertinent  Health Maintenance Due  Topic Date Due   INFLUENZA  VACCINE  09/02/2022   DEXA SCAN  Discontinued      08/18/2021    8:00 PM 08/19/2021   10:00 AM 08/19/2021    8:54 PM 08/20/2021    8:20 AM 03/29/2022   10:11 AM  Fall Risk  Falls in the past year?     0  Was there an injury with Fall?     0  Fall Risk Category Calculator     0  (RETIRED) Patient Fall Risk Level High fall risk High fall risk High fall risk High fall risk   Patient at Risk for Falls Due to     No Fall Risks   Functional Status Survey:    Vitals:   05/20/22 0857 05/20/22 0911  BP: (!) 146/88 124/76  Pulse: 72   Resp: 18   Temp: 97.6 F (36.4 C)   SpO2: 96%   Weight: 176 lb 9.6 oz (80.1 kg)   Height:  (1.702 m)    Body mass index is 27.66 kg/m. Physical Exam Constitutional:      General: She is not in acute distress.    Appearance: She is well-developed. She is not diaphoretic.  HENT:     Head: Normocephalic and atraumatic.     Mouth/Throat:     Pharynx: No oropharyngeal exudate.  Eyes:     Conjunctiva/sclera: Conjunctivae normal.     Pupils: Pupils are equal, round, and reactive to light.  Cardiovascular:     Rate and Rhythm: Normal rate and regular rhythm.     Heart sounds: Normal heart sounds.  Pulmonary:     Effort: Pulmonary effort is normal.     Breath sounds: Normal breath sounds.  Abdominal:     General: Bowel sounds are normal.     Palpations: Abdomen is soft.  Musculoskeletal:     Cervical back: Normal range of motion and neck supple.     Right lower leg: No edema.     Left lower leg: No edema.  Skin:    General: Skin is warm and dry.  Neurological:     Mental Status: She is alert. Mental status is at baseline.     Sensory: Sensory deficit present.     Motor: Weakness present.     Coordination: Coordination abnormal.     Gait: Gait abnormal.     Comments: Quadriplegia   Psychiatric:        Mood and Affect: Mood normal.     Labs reviewed: Recent Labs    08/19/21 0333 08/20/21 0443 09/24/21 0000 02/08/22  0000  03/08/22 0000 04/21/22 1200  NA 130* 130*   < > 137 133* 133*  K 5.1 4.5   < > 4.8 4.0 3.8  CL 102 101   < > 98* 97* 100  CO2 23 26   < > 33* 28* 30  GLUCOSE 117* 101*  --   --   --  108*  BUN 23 15   < > 10 11 8   CREATININE 0.72 0.54   < > 0.5 0.6 0.68  CALCIUM 7.4* 7.1*   < > 9.0 8.0* 8.0*   < > = values in this interval not displayed.   Recent Labs    09/24/21 0000 02/08/22 0000  AST  --  18  ALT  --  14  ALKPHOS  --  65  ALBUMIN 3.2* 3.7   Recent Labs    08/19/21 0333 08/20/21 0443 08/20/21 1029 02/25/22 0000 03/08/22 0000 04/21/22 1200 04/21/22 1731 04/26/22 0000 05/03/22 0000 05/10/22 0000 05/17/22 0000  WBC 10.6* 8.9   < > 5.1 6.2 7.1  --  5.7  --   --   --   NEUTROABS  --   --    < > 2,800.00 3,683.00  --   --  3,283.00  --   --   --   HGB 8.9* 7.8*   < > 10.4* 11.9* 8.5*   < > 8.3* 9.3* 10.0* 10.7*  HCT 27.5* 23.7*   < > 32* 35* 26.6*   < > 26* 28* 31* 33*  MCV 88.4 87.1  --   --   --  99.3  --   --   --   --   --   PLT 198 216   < > 318 360 348  --  444*  --   --   --    < > = values in this interval not displayed.   No results found for: "TSH" No results found for: "HGBA1C" No results found for: "CHOL", "HDL", "LDLCALC", "LDLDIRECT", "TRIG", "CHOLHDL"  Significant Diagnostic Results in last 30 days:  CT ANGIO UP EXTREM LEFT W &/OR WO CONTAST  Result Date: 04/21/2022 CLINICAL DATA:  Shoulder trauma.  Concern for vascular injury. EXAM: CT ANGIOGRAPHY OF THE LEFT UPPER EXTREMITY TECHNIQUE: Multidetector CT imaging of the left upper was performed using the standard protocol during bolus administration of intravenous contrast. Multiplanar CT image reconstructions and MIPs were obtained to evaluate the vascular anatomy. RADIATION DOSE REDUCTION: This exam was performed according to the departmental dose-optimization program which includes automated exposure control, adjustment of the mA and/or kV according to patient size and/or use of iterative reconstruction  technique. CONTRAST:  OMNIPAQUE IOHEXOL 350 MG/ML SOLN COMPARISON:  Chest CT-earlier same; left humerus radiographs-earlier same day CT abdomen and pelvis - 01/21/2019 FINDINGS: VASCULAR FINDINGS: Left axillary artery: Widely patent without a hemodynamically significant narrowing. No vessel irregularity or dissection. Left brachial artery: Widely patent without a hemodynamically significant narrowing. No vessel irregularity or dissection. Left forearm vasculature: Widely patent to the level of the carpal row. The interosseous artery expectedly tapers at the level of the distal radioulnar joint. No discrete intraluminal filling defects to suggest distal embolism. Redemonstrated left pectoralis major hematoma, again without discrete area of contrast extravasation to suggest active arterial hemorrhage. _________________________________________________________ NON-VASCULAR FINDINGS: No fracture. Mild degenerative change of the left glenohumeral joint with joint space loss, subchondral sclerosis and osteophytosis. Post dynamic screw fixation of the left femoral neck and intramedullary rod fixation of the  left femur with fluid collection about the operative site measuring approximately 6.2 x 4.0 cm, nonspecific though presumably a postoperative seroma. Suspected degenerative change of the hand and wrist, suboptimally evaluated due to technique and patient motion. Subcutaneous edema noted about the forearm, wrist and hand. Review of the MIP images confirms the above findings. IMPRESSION: 1. Redemonstrated left pectoralis major hematoma without discrete area of contrast extravasation to suggest active hemorrhage. 2. No evidence of vascular injury affecting the left upper extremity. 3. No acute fracture or dislocation. 4. Post dynamic screw fixation of the left femoral neck intramedullary rod fixation of the left femur with an approximately 6.2 cm fluid collection about the operative site, nonspecific though presumably  a postoperative seroma. Clinical correlation is advised. Electronically Signed   By: Simonne Come M.D.   On: 04/21/2022 17:00   CT Chest W Contrast  Addendum Date: 04/21/2022   ADDENDUM REPORT: 04/21/2022 15:32 ADDENDUM: There is a 10 x 10 x 5 cm dense fluid collection in the left anterior chest and infraclavicular region consistent with a hematoma. No extravasated IV contrast is seen that would indicate the presence of active bleeding during contrast administration. Electronically Signed   By: Layla Maw M.D.   On: 04/21/2022 15:32   Result Date: 04/21/2022 CLINICAL DATA:  Trauma EXAM: CT CHEST WITH CONTRAST TECHNIQUE: Multidetector CT imaging of the chest was performed during intravenous contrast administration. RADIATION DOSE REDUCTION: This exam was performed according to the departmental dose-optimization program which includes automated exposure control, adjustment of the mA and/or kV according to patient size and/or use of iterative reconstruction technique. CONTRAST:  75 mL OMNIPAQUE IOHEXOL 300 MG/ML  SOLN COMPARISON:  None Available. FINDINGS: Cardiovascular: Mild cardiomegaly. No pericardial effusion. Atheromatous calcifications coronary arteries. Mediastinum/Nodes: No enlarged mediastinal, hilar, or axillary lymph nodes. Thyroid gland, trachea, and esophagus demonstrate no significant findings. Lungs/Pleura: Bibasilar subsegmental atelectasis. Minimal left basilar consolidation and tiny left-sided pleural effusion. No pneumothorax. Upper Abdomen: Left kidney cysts measuring up to 2.8 cm. This does not need to be followed up. Otherwise no acute pathology. There is an IVC filter. Musculoskeletal: There are thoracic degenerative changes. IMPRESSION: 1. No evidence of PE, aneurysm or dissection. 2. Mild cardiomegaly. 3. Subsegmental atelectasis/consolidation left base. 4. Small left-sided pleural effusion. 5. Left kidney cysts. Electronically Signed: By: Layla Maw M.D. On: 04/21/2022 13:49    DG Humerus Left  Result Date: 04/21/2022 CLINICAL DATA:  Left arm pain after injury several days ago. EXAM: LEFT HUMERUS - 2+ VIEW COMPARISON:  None Available. FINDINGS: There is no evidence of fracture or other focal bone lesions. Soft tissues are unremarkable. IMPRESSION: Negative. Electronically Signed   By: Lupita Raider M.D.   On: 04/21/2022 13:15    Assessment/Plan 1. Age-related osteoporosis without current pathological fracture -continue to take calcium 600 mg daily with Vitamin D 2000 units daily  -continues on fosamax weekly.   2. Hematoma and contusion Has significantly improved, hgb has improved and now at baseline, will continue iron supplement.   3. Benign essential HTN -Blood pressure well controlled, goal bp <140/90 Continue current medications and dietary modifications follow metabolic panel  4. Neurogenic bladder -stable with foley catheter.   5. Constipation due to slow transit -well controlled on current regimen.   6. Deep vein thrombosis (DVT) of proximal vein of both lower extremities, unspecified chronicity -continues on xarelto.   7. Tetraparesis -stable, uses power wheel chair to maintain independence.  Continues to work with therapy.   8. Hypocalcemia Noted  on recent labs, she is on calcium supplement daily- recently reduced due to increase in sediment in catheter with frequent catheter changes, improved when calcium was decreased.     Janene Harvey. Biagio Borg [facility] [phone]

## 2022-05-24 ENCOUNTER — Ambulatory Visit: Payer: Medicare HMO | Admitting: Occupational Therapy

## 2022-05-24 ENCOUNTER — Ambulatory Visit: Payer: Medicare HMO

## 2022-05-24 DIAGNOSIS — M6281 Muscle weakness (generalized): Secondary | ICD-10-CM | POA: Diagnosis not present

## 2022-05-24 DIAGNOSIS — R262 Difficulty in walking, not elsewhere classified: Secondary | ICD-10-CM

## 2022-05-24 DIAGNOSIS — R278 Other lack of coordination: Secondary | ICD-10-CM

## 2022-05-24 DIAGNOSIS — R269 Unspecified abnormalities of gait and mobility: Secondary | ICD-10-CM

## 2022-05-24 DIAGNOSIS — S72001A Fracture of unspecified part of neck of right femur, initial encounter for closed fracture: Secondary | ICD-10-CM

## 2022-05-24 NOTE — Therapy (Signed)
Occupational Therapy Treatment Note      Patient Name: Sherry [name] MRN: [id number] DOB:08/27/1934, 88 y.o., female Today's Date: 05/24/2022                                         PCP: Dr. Tillman Abide REFERRING PROVIDER: Dr. Tillman Abide   OT End of Session - 05/24/22 2257     Visit Number [id number]    Number of Visits 72    Date for OT Re-Evaluation 06/30/22    OT Start Time 1100    OT Stop Time 1145    OT Time Calculation (min) 45 min    Activity Tolerance Patient tolerated treatment well    Behavior During Therapy [facility] for tasks assessed/performed                  Past Medical History:  Diagnosis Date   Acute blood loss anemia    Arthritis    Cancer    skin   Central cord syndrome at C6 level of cervical spinal cord 11/29/2017   Hypertension    Protein-calorie malnutrition, severe 01/24/2018   S/P insertion of IVC (inferior vena caval) filter 01/24/2018   Tetraparesis    Past Surgical History:  Procedure Laterality Date   ANTERIOR CERVICAL DECOMP/DISCECTOMY FUSION N/A 11/29/2017   Procedure: Cervical five-six, six-seven Anterior Cervical Decompression Fusion;  Surgeon: Jadene Pierini, MD;  Location: [facility] OR;  Service: Neurosurgery;  Laterality: N/A;  Cervical five-six, six-seven Anterior Cervical Decompression Fusion   CATARACT EXTRACTION     FEMUR IM NAIL Left 08/16/2021   Procedure: INTRAMEDULLARY (IM) NAIL FEMORAL;  Surgeon: Deeann Saint, MD;  Location: [facility] ORS;  Service: Orthopedics;  Laterality: Left;   IR IVC FILTER PLMT / S&I /IMG GUID/MOD SED  12/08/2017   Patient Active Problem List   Diagnosis Date Noted   Hypertensive heart disease with other congestive heart failure 02/14/2022   Chronic indwelling Foley catheter 02/14/2022   Closed hip fracture 08/15/2021   DVT (deep venous thrombosis) 08/15/2021   Quadriplegia 08/15/2021   Fall 08/15/2021   Constipation due to slow transit 08/31/2018   Trauma 06/05/2018   Neuropathic pain 06/05/2018    Neurogenic bowel 06/05/2018   Vaginal yeast infection 01/30/2018   Healthcare-associated pneumonia 01/25/2018   Chronic allergic rhinitis 01/24/2018   Depression with anxiety 01/24/2018   UTI due to Klebsiella species 01/24/2018   Protein-calorie malnutrition, severe 01/24/2018   S/P insertion of IVC (inferior vena caval) filter 01/24/2018   Tetraparesis 01/20/2018   Neurogenic bladder 01/20/2018   Reactive depression    Benign essential HTN    Acute postoperative anemia due to expected blood loss    Central cord syndrome at C6 level of cervical spinal cord 11/29/2017   Allergy to alpha-gal 11/25/2016   SCC (squamous cell carcinoma) 04/08/2014    REFERRING DIAG: Central Cord Syndrome at C6 of the Cervical Spinal Cord, Fall with Bilateral Closed Hip Fractures, with ORIF repair with Intramedullary Nailing of the Left Hip Fracture.  THERAPY DIAG:  Muscle weakness (generalized)  Rationale for Evaluation and Treatment Rehabilitation  PERTINENT HISTORY: Patient s/p fall November 29, 2017 resulting in diagnosis of central cord syndrome at C 6 level.  She has had therapy in multiple venues but with recent move to [facility], therapy staff recommended she seek outpatient therapy for her needs. Pt. Had a recent fall with Bilateral Closed  Hip Fractures, with ORIF repair with Intramedullary Nailing of the Left Hip Fracture.   PRECAUTIONS: None  SUBJECTIVE:   Pt. reports doing well today.  PAIN:  Are you having pain? No  OBJECTIVE:    Manual Therapy:   Pt. tolerated soft tissue massage to the volar, and dorsal surface of each digit on the bilateral hands, and thenar eminence secondary to stiffness following moist heat modality. Soft tissue mobilization was performed for Carpal, and metacarpal spread stretches. Manual therapy was performed independent of, and in preparation for therapeutic Ex.   Therapeutic Ex.    Pt. tolerated PROM followed by AROM bilateral wrist extension, PROM for  bilateral digit MP, PIP, and DIP flexion, and extension, thumb radial, and palmar abduction. Pt. worked on [employer], and reciprocal motion using the UBE while seated for 8 min. with minimal resistance. Constant monitoring was provided.   PATIENT EDUCATION: RUE Distal ROM, and strength Person educated: Patient Education method: Explanation and Demonstration Education comprehension: verbalized understanding, returned demonstration, and needs further education   HOME EXERCISE PROGRAM Continue with ongoing HEPs  Measurements:  12/09/2021:  Shoulder flexion: R: 120(136), L: 138(150) Shoulder abduction: R: 108(120), L: 110(120) Elbow: R: 0-148, L: 0-146 Wrist flexion: R: 55(62), L: 78 Writ extension: R: 34(55), L: 3(4) RD: R: 12(20), L: 16(26) UD: R: 8(24), L: 6(18) Thumb radial abduction: R: 11(20), L: 8(20) Digit flexion to the [facility]: R:  2nd: 5cm(3.5cm), 3rd: 7.5 cm(5cm), 4th: 7cm(3cm), 5th: 6cm(5.5cm) L: 2nd: 8cm(8cm), 3rd: 8cm(8cm), 4th: 7.5cm(7cm), 5th: 7cm(7cm)  02/03/2022:   Shoulder flexion: R: 122(138), L: 148(154) Shoulder abduction: R: 109(125), L: 125(125 Elbow: R: 0-148, L: 0-146 Wrist flexion: R: 60(68), L: 79 Writ extension: R: 40(55), L: 3(10) RD: R: 14(24), L: 20(28) UD: R: 8(24), L: 6(18) Thumb radial abduction: R: 20(25), L: 8(20) Digit flexion to the [facility]: R:  2nd: 4.5cm(3cm), 3rd: 6.5cm(4.5cm), 4th: 6.5 cm(3cm), 5th: 4.5cm(5cm) L: 2nd: 8cm(8cm), 3rd: 8cm(7cm), 4th: 7.5cm(7cm), 5th: 6.5cm(6.5cm)   04/07/2022:   Shoulder flexion: R: 125(150), L: 160(160) Shoulder abduction: R: 109(125), L: 125(125 Elbow: R: 0-148, L: 0-146 Wrist flexion: R: 60(68), L: 79 Writ extension: R: 40(55), L: 5(18) RD: R: 14(24), L: 20(28) UD: R: 18(24), L: 8(18) Thumb radial abduction: R: 20(25), L: 8(20) Digit flexion to the [facility]: R:  2nd: 4 cm(4cm), 3rd: 6.5cm(4cm), 4th: 7 cm (5cm), 5th: 6cm(5cm) L: 2nd: 8cm(8cm), 3rd: 8cm(7cm), 4th: 7.5cm(7cm), 5th: 7cm(7cm)            OT Long Term Goals - 07/06/21 1106                                                                                          Target Date: 06/30/2022    OT LONG TERM GOAL #1   Title Patient and caregiver will demonstrate understanding of home exercise program for ROM.    Baseline 12/09/2021: Pt. Reports the restorative ROM program has not resumed at the facility. 10/28/2021: Pt. Is attempting exercises/stretches at home. The facility restorative aides have not resumed exercises. 09/14/2021:  Pt. Reports inconsistencies with restorative ROM at  her care home. Pt/caregiver education to be provided about ongoing  ROM needs.    Time 12    Period Weeks    Status Deferred     OT LONG TERM GOAL #2   Title Pt.  will improve BUE strength to be able to sustain her UE's in elevation to efficiently, and effectively perform hair care, and self-grooming tasks.   Baseline 02/03/2022: Pt. has improved, and is now able to sustain her Bilateral UEs in elevation long enough to efficiently, and effectively perform haircare, and self-grooming tasks. 12/09/2021: right shoulder 4-/5, left shoulder 4/5. Pt. has improved to be able to reach up for hair care, and self-grooming tasks. 10/28/2021: Right shoulder 3+/5, Left shoulder 4-/5 09/14/2021:  BUE shoulder strength: 3+/5 for shoulder flexion, and abduction.   Time 12    Period Weeks    Status Achieved               OT LONG TERM GOAL #3   Title Patient will demonstrate improved composite finger flexion to hold deodorant to apply to underarms.    Baseline 04/07/2022: Pt. Has improved with right 2nd, and 3rd digit flexion towards the [facility]. Pt. Continues to have difficulty securely holding and applying deodorant.02/03/2022: Pt. Has improved with digit flexion, however continues to have difficulty securely holding and using deoderant. 12/09/2021: Bilateral hand/digit MP, PIP, and DIP extension tightness limits her ability achieve digit flexion to hold, and apply  deodorant. 10/28/2021:  Pt. Continues to have difficulty holding the deodorant. 01/12/2021: Pt. Presents with limited digit extension. Pt. Is able to initiate holding deodorant, however is unable to hold it while using it.   Time 12    Period Weeks    Status Ongoing   Target Date                  OT LONG TERM GOAL #4   Title Pt. Will improve bilateral wrist extension in preparation for anticipating, and initiating reaching for objects at the table.    Baseline 04/07/2022: Rght: 40(55) Left: 5(18) 02/03/2022: Right: 34(55) Left: 3(4) 12/09/2021: Right  34(55)  Left  3(4) 10/28/2021:  Right 22(38), Left 0(15) 09/14/2021:  Right: 17(35), left 2(15)   Time 12   Period weeks   Status Ongoing   Target Date      OT LONG TERM GOAL #5   Title Pt. will write, and sign her name with 100% legibility, and modified independence.    Baseline 04/07/2022: Pt. Reports that she continues to present with shaky appearing writing. Pt.reports that she is writing more when filling out her menu, and complete puzzles 02/03/2022: Pt. Continues to report legibility appears "shaky" when attempting signing her name. Pt.reports that she is writing more when filling out her menu, and complete puzzles.12/09/2021: Pt. Reports legibility appears "shaky" when attempting signing her name. Pt.reports that she is writing more when filling out her menu. 10/28/2021: pt. is able to sign her name, however legibility continues to be inconsistent. Pt. Reports "shaky appearance". 09/14/2021: Pt. continues to fill out her daily menu and complete puzzles. Pt. Continues to present with limited writing legibility.   Time 12    Period Weeks    Status  Ongoing         OT LONG TERM GOAL  #6   TITLE Pt. will turn pages in a book with modified independence.    Baseline 02/03/2022: Pt. Is able to effectively turn pages of a book. 10/28/2021: Pt. Is able to consistently turn book pages. 09/14/2021: Pt. has difficulty turning pages.    Time  12    Period  Weeks    Status Achieved       OT LONG TERM GOAL  #7  TITLE Pt. Will increase  bilateral lateral pinch strength by 2 lbs to be able to securely grasp items during ADLs, and IADL tasks.   Baseline 04/07/2022: NT-Pinch meter out for calibration. 02/03/2022: Right: 6#, Left: 4# 12/09/2021:  Right: 6#, Left: 4#   Time 12  Period Weeks  Status Ongoing           OT LONG TERM GOAL  #8  TITLE Pt. Will button a sweater with large 1" buttons, and zip her jacket with modified independence.   Baseline 04/07/2022: Pt. Has difficulty manipulating buttons, however was introduced to a buttonhook. Pt. Was able to hold, and use a buttonhook for buttons on a shirt. 02/03/2022: Pt. is unable to manipulate buttons on her sweater, or zippers on her jacket.  Time 12  Period Weeks  Status Ongoing   OT LONG TERM GOAL  #9  TITLE Pt. will complete plant care with modified independence.  Baseline 04/07/2022: Pt. is now able to hold a cup and water her plants.02/03/2022: Pt. has difficulty caring for her plants.   Time 12  Period Weeks  Status Ongoing   OT LONG TERM GOAL  #10  TITLE Pt. will demonstrate adaptive techniques to assist with the efficiency of self-dressing, or morning care tasks.  Baseline 04/07/2022: Pt. Continues to require assist with the efficiency of self-dressing,a nd morning care tasks.02/03/2022: Pt. requires assist from caregivers 2/2 time limitations during morning care.   Time 12  Period Weeks  Status Ongoing     Plan - 07/22/21 1737     Clinical Impression Statement Pt. tolerated the BUE UBE well today. Pt. continues to  tolerate distal bilateral wrist, and digit  ROM well. Pt. continues to present with tightness at the bilateral thumb webspace, and bilateral wrist flexor tightness, as well as digit MP, PIP, and DIP extensor tightness which continues to limit her ability to complete ADL tasks efficiently. Pt. continues to benefit from working on impoving ROM in order to work towards  increasing bilateral hand grasp on objects, and increase engagement of bilateral hands during ADLs, and IADL tasks.                     OT Occupational Profile and History Detailed Assessment- Review of Records and additional review of physical, cognitive, psychosocial history related to current functional performance    Occupational performance deficits (Please refer to evaluation for details): ADL's;IADL's;Leisure;Social Participation    Body Structure / Function / Physical Skills ADL;Continence;Dexterity;Flexibility;Strength;ROM;Balance;Coordination;FMC;IADL;Endurance;UE functional use;Decreased knowledge of use of DME;[facility]    Psychosocial Skills Coping Strategies    Rehab Potential Fair    Clinical Decision Making Several treatment options, min-mod task modification necessary    Comorbidities Affecting Occupational Performance: Presence of comorbidities impacting occupational performance    Comorbidities impacting occupational performance description: contractures of bilateral hands, dependent transfers,    Modification or Assistance to Complete Evaluation  No modification of tasks or assist necessary to complete eval    OT Frequency 2x / week    OT Duration 12 weeks    OT Treatment/Interventions Self-care/ADL training;Cryotherapy;Therapeutic exercise;DME and/or AE instruction;Balance training;Neuromuscular education;Manual Therapy;Splinting;Moist Heat;Passive range of motion;Therapeutic activities;Patient/family education    Plan continue to progress ROM of digits, wrists and shoulders as it pertains to completion of ADL tasks that pt values.    Consulted and Agree with Plan of  Care Patient            Olegario Messier, MS, OTR/L       `

## 2022-05-24 NOTE — Therapy (Signed)
OUTPATIENT PHYSICAL THERAPY NEURO TREATMENT   Patient Name: Sherry [name] MRN: [id number] DOB:[birth year], 88 y.o., female Today's Date: 05/24/2022   PCP: Earnestine Mealing, MD REFERRING PROVIDER: Deeann Saint   PT End of Session - 05/24/22 1146     Visit Number 43    Number of Visits 58    Date for PT Re-Evaluation 06/30/22    Authorization Time Period Initial cert 05/10/8117-14/[id number]/2956; Recert 01/11/2022-04/05/2022; Recert 04/07/2022-06/30/2022    Progress Note Due on Visit 50    PT Start Time 1146    Equipment Utilized During Treatment Gait belt    Activity Tolerance Patient tolerated treatment well    Behavior During Therapy [facility] for tasks assessed/performed                           Past Medical History:  Diagnosis Date   Acute blood loss anemia    Arthritis    Cancer    skin   Central cord syndrome at C6 level of cervical spinal cord 11/29/2017   Hypertension    Protein-calorie malnutrition, severe 01/24/2018   S/P insertion of IVC (inferior vena caval) filter 01/24/2018   Tetraparesis    Past Surgical History:  Procedure Laterality Date   ANTERIOR CERVICAL DECOMP/DISCECTOMY FUSION N/A 11/29/2017   Procedure: Cervical five-six, six-seven Anterior Cervical Decompression Fusion;  Surgeon: Jadene Pierini, MD;  Location: [facility] OR;  Service: Neurosurgery;  Laterality: N/A;  Cervical five-six, six-seven Anterior Cervical Decompression Fusion   CATARACT EXTRACTION     FEMUR IM NAIL Left 08/16/2021   Procedure: INTRAMEDULLARY (IM) NAIL FEMORAL;  Surgeon: Deeann Saint, MD;  Location: [facility] ORS;  Service: Orthopedics;  Laterality: Left;   IR IVC FILTER PLMT / S&I /IMG GUID/MOD SED  12/08/2017   Patient Active Problem List   Diagnosis Date Noted   Hypertensive heart disease with other congestive heart failure 02/14/2022   Chronic indwelling Foley catheter 02/14/2022   Closed hip fracture 08/15/2021   DVT (deep venous thrombosis) 08/15/2021   Quadriplegia  08/15/2021   Fall 08/15/2021   Constipation due to slow transit 08/31/2018   Trauma 06/05/2018   Neuropathic pain 06/05/2018   Neurogenic bowel 06/05/2018   Vaginal yeast infection 01/30/2018   Healthcare-associated pneumonia 01/25/2018   Chronic allergic rhinitis 01/24/2018   Depression with anxiety 01/24/2018   UTI due to Klebsiella species 01/24/2018   Protein-calorie malnutrition, severe 01/24/2018   S/P insertion of IVC (inferior vena caval) filter 01/24/2018   Tetraparesis 01/20/2018   Neurogenic bladder 01/20/2018   Reactive depression    Benign essential HTN    Acute postoperative anemia due to expected blood loss    Central cord syndrome at C6 level of cervical spinal cord 11/29/2017   Allergy to alpha-gal 11/25/2016   SCC (squamous cell carcinoma) 04/08/2014    ONSET DATE: 08/15/2021 (fall with B hip fx); Initial injury was in 2019- quadraplegia due to central cord syndrome of C6 secondary to neck fracture.   REFERRING DIAG: Bilateral Hip fx; Left Open- s/p Left IM nail femoral on 08/16/2021; Right closed  THERAPY DIAG:  Muscle weakness (generalized)  Abnormality of gait and mobility  Other lack of coordination  Difficulty in walking, not elsewhere classified  Closed fracture of both hips, initial encounter  Rationale for Evaluation and Treatment Rehabilitation  SUBJECTIVE:   SUBJECTIVE STATEMENT:    Patient reports having an uneventful weekend with no new issues.   Pt accompanied by:  Paid caregiver-   PERTINENT  HISTORY: Patient was previous patient (see previous episode for specifics) She experienced a fall at home on 08/15/2021 and was in Hospital from 08/15/2021- 08/20/2021.  Most recent history per Dr. Shonna [name] on 08/20/2021.  Sherry [name] is a 88 y.o. female with medical history significant of quadriplegia due to central cord syndrome of C6 secondary to neck fracture, hypertension, depression with anxiety, neurogenic bladder, bilateral DVT on  Xarelto (s/p of IVC filter placement), who presents with fall and hip pain.   Patient has history of quadriplegia, and has been doing PT/OT with improvement of function. Pt has some use of both arms and can feed herself. She can walk a little bit with assistance. Pt accidentally fell at about 930 this morning after taking shower.  She injured her hips. Hospital Course:    Closed left hip fracture [facility]) s/p mechanical fall Closed right hip fracture (?old) Acute blood loss anemia --pt is s/p  INTRAMEDULLARY (IM) NAIL FEMORAL-left on 7/16  PAIN:  Are you having pain? NO: NPRS scale: 0/10 Pain location: Right hip Pain description: Ache Aggravating factors: any active Movement of right side Relieving factors: Rest  PRECAUTIONS: Fall  WEIGHT BEARING RESTRICTIONS Yes Bilateral non-weight bearing B LE's  FALLS: Has patient fallen in last 6 months? Yes. Number of falls 1  LIVING ENVIRONMENT: Lives with: lives with their family and lives in a nursing home Lives in: Other Long term care at [facility] Stairs: No Has following equipment at home: Wheelchair (power), Ramped entry, and hoyer lift  PLOF: Needs assistance with ADLs, Needs assistance with homemaking, Needs assistance with gait, Needs assistance with transfers, and Leisure: Work puzzles, read, Watch TV  PATIENT GOALS Patient goal is to walk; Be able to strengthen my arms for better use  OBJECTIVE:   DIAGNOSTIC FINDINGS: Overall cognitive statusEXAM: DG HIP (WITH OR WITHOUT PELVIS) 2-3V LEFT; DG HIP (WITH OR WITHOUT PELVIS) 2-3V RIGHT   COMPARISON:  CT of the abdomen and pelvis 01/21/2020   FINDINGS: Left proximal femur fracture noted just below the intertrochanteric region. Marked ventral angulation noted on the cross-table image. Slight varus angulation present   Comminuted intratrochanteric fracture present on the right also with marked ventral angulation.   IMPRESSION: 1. Bilateral hip fractures. 2. Comminuted  intertrochanteric fracture of the right hip. 3. Angulated fracture of the proximal left femur just below the intertrochanteric region.     Electronically Signed   By: Marin Roberts M.D.   On: 08/15/2021 11:54:   COGNITION:  Within functional limits for tasks assessed   SENSATION: Light touch: Impaired   EDEMA:  None  Strength R/L 2-/2- Shoulder flexion (anterior deltoid/pec major/coracobrachialis, axillary n. (C5-6) and musculocutaneous n. (C5-7)) 2-/3+ (within available ROM around 130 on lef)Shoulder abduction (deltoid/supraspinatus, axillary/suprascapular n, C5) 3+/3+ Shoulder external rotation (infraspinatus/teres minor) 4/4 Shoulder internal rotation (subcapularis/lats/pec major) 3+/3+ Shoulder extension (posterior deltoid, lats, teres major, axillary/thoracodorsal n.) 3+/3+ Shoulder horizontal abduction 4/4 Elbow flexion (biceps brachii, brachialis, brachioradialis, musculoskeletal n, C5-6) 4/4 Elbow extension (triceps, radial n, C7) 2-/2- Wrist Extension 3+/3+ Wrist Flexion   LOWER EXTREMITY MMT:    MMT Right 01/11/22 Left 01/11/22  Hip flexion 2- 2-  Hip extension 2- 2-  Hip abduction 2- 2-  Hip adduction 2- 3  Hip internal rotation 2- 2-  Hip external rotation 2- 2-  Knee flexion 2- 2-  Knee extension 2+ 3+  Ankle dorsiflexion 2- NT - boot  Ankle plantarflexion 2- NT- boot  Ankle inversion 2- NT- boot  Ankle  eversion 2- NT- boot  (Blank rows = not tested)  BED MOBILITY:  Unable to test due to NWB status of BLE- Patient currently dependent with all bed mobility using hoyer and caregiver assist  TRANSFERS: Currently BLE NWB- Dependent on use of hoyer for all transfers  FUNCTIONAL TESTs:   PATIENT SURVEYS:  FOTO 12  TODAY'S TREATMENT:  THERE.EX:   PROM hamstring stretch while seated- 30 sec hold x 2 each side Static stand in // bars x 3 attempts today ranging from 20 sec to 45 sec with patient exhibiting difficulty with standing erect  today and limited by fatigue.                       AAROM- B hip flex 2 sets x 15 reps  AROM- LAQ- each LE 2 sets of 15 reps each LE B Hip abd AAROM 2 sets  x 15 reps    Pt educated throughout session about proper posture and technique with exercises. Improved exercise technique, movement at target joints, use of target muscles after min to mod verbal, visual, tactile cues.   PATIENT EDUCATION: Education details: PT plan of care/use of resistive bands Person educated: Patient Education method: Explanation, Demonstration, Tactile cues, and Verbal cues Education comprehension: verbalized understanding, returned demonstration, verbal cues required, tactile cues required, and needs further education   HOME EXERCISE PROGRAM: No changes at this time  Access Code: [facility] URL: [email or web address] Date: 11/23/2021 Prepared by: Precious Bard Exercises - Seated Gluteal Sets - 2 x daily - 7 x weekly - 2 sets - 10 reps - 5 hold - Seated Quad Set - 2 x daily - 7 x weekly - 2 sets - 10 reps - 5 hold - Seated Long Arc Quad - 2 x daily - 7 x weekly - 2 sets - 10 reps - 5 hold - Seated March - 2 x daily - 7 x weekly - 2 sets - 10 reps - 5 hold - Seated Hip Abduction - 2 x daily - 7 x weekly - 2 sets - 10 reps - 5 hold - Seated Shoulder Shrugs - 2 x daily - 7 x weekly - 2 sets - 10 reps - 5 hold - Wheelchair Pressure Relief - 2 x daily - 7 x weekly - 2 sets - 10 reps - 5 hold  GOALS: Goals reviewed with patient? Yes  SHORT TERM GOALS: Target date: 02/22/2022  Pt will be independent with initial UE strengthening HEP in order to improve strength and balance in order to decrease fall risk and improve function at home and work. Baseline: 10/19/2021- patient with no formal UE HEP; 12/02/2021= Patient verbalized knowledge of HEP including use of theraband for UE strengthening.  Goal status: GOAL MET  LONG TERM GOALS: Target date: 06/30/2022  Pt will be independent with final for UE/LE HEP in order  to improve strength and balance in order to decrease fall risk and improve function at home and work. Baseline: Patient is currently BLE NWB and unable to participate in HEP. Has order for UE strengthening. 01/11/2022- Patient now able to participate in LE strengthening although NWB still- good understanding for some basic exercises. Will keep goal active to incorporate progressive LE strengthening exercises. 04/07/2022= Patient still participating in progressive LE seated HEP and has no questions at this time.  Goal status: MET  2.  Pt will improve FOTO to target score of 35  to display perceived improvements in ability to complete ADL's.  Baseline:  10/19/2021= 12; 12/02/2021= 17; 01/11/2022=17; 04/07/2022= 17 Goal status: Ongoing  3.  Pt will increase strength of B UE  by at least 1/2 MMT grade in order to demonstrate improvement in strength and function  Baseline: patient has range of 2-/5 to 4/5 BUE Strength; 12/02/2021= 4/5 except with wrist ext. 01/11/2022= 4/5 B UE strength expept for wrist Goal status: Partially met  4. Pt. Will increase strength of RLE by at least 1/2 MMT grade in order to demonstrate improvement in Standing/transfers.  Baseline: 2-/5 Right hip flex/knee ext/flex; 04/07/2022= 2- with right hip flex/knee ext (lacking 28 deg from zero) 4/8= Patient able to ext right knee lacking 18 deg from zero.   Goal status: Progressing  5. Pt. Will demo ability to stand pivot transfer with max assist for improved functional mobility and less dependent need on mechanical device.   Baseline: Dependent on hoyer lift for all transfers; 04/07/2022- Unable to test secondary to patient with recent UTI and right hand procedure - will attempt to reassess next visit. ; 04/12/22: Unable to successfully stand due to feet plates on loaned power w/c; 05/10/2022- Patient still having to use rental chair and has not received her original power w/c back to practice SPT.   Goal status: ONGOING  6. Pt. Will  demonstrate improved functional LE strength as seen by ability to stand > 2 min for improved transfer ability and pregait abilities.   Baseline: not assessed today due to recent dx: UTI; 04/07/2022- Unable to test secondary to patient with recent UTI and right hand procedure - will attempt to reassess next visit; 04/12/22: Unable to successfully stand due to feet plates on loaned power w/c4/09/2022- Patient still having to use rental chair and has not received her original power w/c back to practice Standing.   Goal status: ONGOING  ASSESSMENT:  CLINICAL IMPRESSION: Patient able to return to standing today without report of pain. She was very limited with most difficulty achieving erect standing due to LE and hip weakness. She was surprised that she was able to stand up well but rated standing as very hard today- exhibiting poor standing functional endurance vs. Before her most recent injury. Pt will continue to benefit from skilled PT services to optimize independence and reduced caregiver support.    OBJECTIVE IMPAIRMENTS Abnormal gait, decreased activity tolerance, decreased balance, decreased coordination, decreased endurance, decreased mobility, difficulty walking, decreased ROM, decreased strength, hypomobility, impaired flexibility, impaired UE functional use, postural dysfunction, and pain.   ACTIVITY LIMITATIONS carrying, lifting, bending, sitting, standing, squatting, sleeping, stairs, transfers, bed mobility, continence, bathing, toileting, dressing, self feeding, reach over head, hygiene/grooming, and caring for others  PARTICIPATION LIMITATIONS: meal prep, cleaning, laundry, medication management, personal finances, interpersonal relationship, driving, shopping, community activity, and yard work  PERSONAL FACTORS Age, Time since onset of injury/illness/exacerbation, and 1-2 comorbidities: HTN, cervical Sx  are also affecting patient's functional outcome.   REHAB POTENTIAL: Good  CLINICAL  DECISION MAKING: Evolving/moderate complexity  EVALUATION COMPLEXITY: Moderate  PLAN: PT FREQUENCY: 1-2x/week  PT DURATION: 12 weeks  PLANNED INTERVENTIONS: Therapeutic exercises, Therapeutic activity, Neuromuscular re-education, Balance training, Gait training, Patient/Family education, Self Care, Joint mobilization, DME instructions, Dry Needling, Electrical stimulation, Wheelchair mobility training, Spinal mobilization, Cryotherapy, Moist heat, and Manual therapy  PLAN FOR NEXT SESSION: Continue with weight bearing activities, hoyer over to mat table and practice standing next visit and LE strengthening as appropriate.   Louis Meckel, PT Physical Therapist- [facility]  05/24/2022, 11:47 AM

## 2022-05-26 ENCOUNTER — Ambulatory Visit: Payer: Medicare HMO | Admitting: Occupational Therapy

## 2022-05-26 ENCOUNTER — Ambulatory Visit: Payer: Medicare HMO

## 2022-05-26 ENCOUNTER — Encounter: Payer: Self-pay | Admitting: Occupational Therapy

## 2022-05-26 DIAGNOSIS — R278 Other lack of coordination: Secondary | ICD-10-CM

## 2022-05-26 DIAGNOSIS — S14129S Central cord syndrome at unspecified level of cervical spinal cord, sequela: Secondary | ICD-10-CM

## 2022-05-26 DIAGNOSIS — R269 Unspecified abnormalities of gait and mobility: Secondary | ICD-10-CM

## 2022-05-26 DIAGNOSIS — M6281 Muscle weakness (generalized): Secondary | ICD-10-CM | POA: Diagnosis not present

## 2022-05-26 DIAGNOSIS — R2689 Other abnormalities of gait and mobility: Secondary | ICD-10-CM

## 2022-05-26 DIAGNOSIS — R262 Difficulty in walking, not elsewhere classified: Secondary | ICD-10-CM

## 2022-05-26 DIAGNOSIS — S72002A Fracture of unspecified part of neck of left femur, initial encounter for closed fracture: Secondary | ICD-10-CM

## 2022-05-26 NOTE — Therapy (Signed)
OUTPATIENT PHYSICAL THERAPY NEURO TREATMENT   Patient Name: Sherry [name] MRN: [id number] DOB:May 31, 1934, 88 y.o., female Today's Date: 05/24/2022   PCP: Earnestine Mealing, MD REFERRING PROVIDER: Deeann Saint   PT End of Session - 05/24/22 1146     Visit Number 43    Number of Visits 58    Date for PT Re-Evaluation 06/30/22    Authorization Time Period Initial cert [id number]; Recert 01/11/2022-04/05/2022; Recert 04/07/2022-06/30/2022    Progress Note Due on Visit 50    PT Start Time 1146    Equipment Utilized During Treatment Gait belt    Activity Tolerance Patient tolerated treatment well    Behavior During Therapy [facility] for tasks assessed/performed                           Past Medical History:  Diagnosis Date   Acute blood loss anemia    Arthritis    Cancer    skin   Central cord syndrome at C6 level of cervical spinal cord 11/29/2017   Hypertension    Protein-calorie malnutrition, severe 01/24/2018   S/P insertion of IVC (inferior vena caval) filter 01/24/2018   Tetraparesis    Past Surgical History:  Procedure Laterality Date   ANTERIOR CERVICAL DECOMP/DISCECTOMY FUSION N/A 11/29/2017   Procedure: Cervical five-six, six-seven Anterior Cervical Decompression Fusion;  Surgeon: Jadene Pierini, MD;  Location: [facility] OR;  Service: Neurosurgery;  Laterality: N/A;  Cervical five-six, six-seven Anterior Cervical Decompression Fusion   CATARACT EXTRACTION     FEMUR IM NAIL Left 08/16/2021   Procedure: INTRAMEDULLARY (IM) NAIL FEMORAL;  Surgeon: Deeann Saint, MD;  Location: [facility] ORS;  Service: Orthopedics;  Laterality: Left;   IR IVC FILTER PLMT / S&I /IMG GUID/MOD SED  12/08/2017   Patient Active Problem List   Diagnosis Date Noted   Hypertensive heart disease with other congestive heart failure 02/14/2022   Chronic indwelling Foley catheter 02/14/2022   Closed hip fracture 08/15/2021   DVT (deep venous thrombosis) 08/15/2021   Quadriplegia  08/15/2021   Fall 08/15/2021   Constipation due to slow transit 08/31/2018   Trauma 06/05/2018   Neuropathic pain 06/05/2018   Neurogenic bowel 06/05/2018   Vaginal yeast infection 01/30/2018   Healthcare-associated pneumonia 01/25/2018   Chronic allergic rhinitis 01/24/2018   Depression with anxiety 01/24/2018   UTI due to Klebsiella species 01/24/2018   Protein-calorie malnutrition, severe 01/24/2018   S/P insertion of IVC (inferior vena caval) filter 01/24/2018   Tetraparesis 01/20/2018   Neurogenic bladder 01/20/2018   Reactive depression    Benign essential HTN    Acute postoperative anemia due to expected blood loss    Central cord syndrome at C6 level of cervical spinal cord 11/29/2017   Allergy to alpha-gal 11/25/2016   SCC (squamous cell carcinoma) 04/08/2014    ONSET DATE: 08/15/2021 (fall with B hip fx); Initial injury was in 2019- quadraplegia due to central cord syndrome of C6 secondary to neck fracture.   REFERRING DIAG: Bilateral Hip fx; Left Open- s/p Left IM nail femoral on 08/16/2021; Right closed  THERAPY DIAG:  Muscle weakness (generalized)  Abnormality of gait and mobility  Other lack of coordination  Difficulty in walking, not elsewhere classified  Closed fracture of both hips, initial encounter  Rationale for Evaluation and Treatment Rehabilitation  SUBJECTIVE:   SUBJECTIVE STATEMENT:    Patient reports not feeling great today. States bloated and feeling rough   Pt accompanied by:  Paid caregiver-  PERTINENT HISTORY: Patient was previous patient (see previous episode for specifics) She experienced a fall at home on 08/15/2021 and was in Hospital from 08/15/2021- 08/20/2021.  Most recent history per Dr. Shonna Chock on 08/20/2021.  Sherry [name] is a 88 y.o. female with medical history significant of quadriplegia due to central cord syndrome of C6 secondary to neck fracture, hypertension, depression with anxiety, neurogenic bladder, bilateral DVT  on Xarelto (s/p of IVC filter placement), who presents with fall and hip pain.   Patient has history of quadriplegia, and has been doing PT/OT with improvement of function. Pt has some use of both arms and can feed herself. She can walk a little bit with assistance. Pt accidentally fell at about 930 this morning after taking shower.  She injured her hips. Hospital Course:    Closed left hip fracture [facility]) s/p mechanical fall Closed right hip fracture (?old) Acute blood loss anemia --pt is s/p  INTRAMEDULLARY (IM) NAIL FEMORAL-left on 7/16  PAIN:  Are you having pain? NO: NPRS scale: 0/10 Pain location: Right hip Pain description: Ache Aggravating factors: any active Movement of right side Relieving factors: Rest  PRECAUTIONS: Fall  WEIGHT BEARING RESTRICTIONS Yes Bilateral non-weight bearing B LE's  FALLS: Has patient fallen in last 6 months? Yes. Number of falls 1  LIVING ENVIRONMENT: Lives with: lives with their family and lives in a nursing home Lives in: Other Long term care at [facility] Stairs: No Has following equipment at home: Wheelchair (power), Ramped entry, and hoyer lift  PLOF: Needs assistance with ADLs, Needs assistance with homemaking, Needs assistance with gait, Needs assistance with transfers, and Leisure: Work puzzles, read, Watch TV  PATIENT GOALS Patient goal is to walk; Be able to strengthen my arms for better use  OBJECTIVE:   DIAGNOSTIC FINDINGS: Overall cognitive statusEXAM: DG HIP (WITH OR WITHOUT PELVIS) 2-3V LEFT; DG HIP (WITH OR WITHOUT PELVIS) 2-3V RIGHT   COMPARISON:  CT of the abdomen and pelvis 01/21/2020   FINDINGS: Left proximal femur fracture noted just below the intertrochanteric region. Marked ventral angulation noted on the cross-table image. Slight varus angulation present   Comminuted intratrochanteric fracture present on the right also with marked ventral angulation.   IMPRESSION: 1. Bilateral hip fractures. 2. Comminuted  intertrochanteric fracture of the right hip. 3. Angulated fracture of the proximal left femur just below the intertrochanteric region.     Electronically Signed   By: Marin Roberts M.D.   On: 08/15/2021 11:54:   COGNITION:  Within functional limits for tasks assessed   SENSATION: Light touch: Impaired   EDEMA:  None  Strength R/L 2-/2- Shoulder flexion (anterior deltoid/pec major/coracobrachialis, axillary n. (C5-6) and musculocutaneous n. (C5-7)) 2-/3+ (within available ROM around 130 on lef)Shoulder abduction (deltoid/supraspinatus, axillary/suprascapular n, C5) 3+/3+ Shoulder external rotation (infraspinatus/teres minor) 4/4 Shoulder internal rotation (subcapularis/lats/pec major) 3+/3+ Shoulder extension (posterior deltoid, lats, teres major, axillary/thoracodorsal n.) 3+/3+ Shoulder horizontal abduction 4/4 Elbow flexion (biceps brachii, brachialis, brachioradialis, musculoskeletal n, C5-6) 4/4 Elbow extension (triceps, radial n, C7) 2-/2- Wrist Extension 3+/3+ Wrist Flexion   LOWER EXTREMITY MMT:    MMT Right 01/11/22 Left 01/11/22  Hip flexion 2- 2-  Hip extension 2- 2-  Hip abduction 2- 2-  Hip adduction 2- 3  Hip internal rotation 2- 2-  Hip external rotation 2- 2-  Knee flexion 2- 2-  Knee extension 2+ 3+  Ankle dorsiflexion 2- NT - boot  Ankle plantarflexion 2- NT- boot  Ankle inversion 2- NT- boot  Ankle eversion 2- NT- boot  (Blank rows = not tested)  BED MOBILITY:  Unable to test due to NWB status of BLE- Patient currently dependent with all bed mobility using hoyer and caregiver assist  TRANSFERS: Currently BLE NWB- Dependent on use of hoyer for all transfers  FUNCTIONAL TESTs:   PATIENT SURVEYS:  FOTO 12  TODAY'S TREATMENT:  THERE.EX:   PROM hamstring stretch while seated- 30 sec hold x 2 each side                      AAROM- B hip flex 2 sets x 15 reps  AROM- LAQ- each LE 2 sets of 15 reps each LE B Hip abd AAROM 2 sets  x  15 reps B hip add with 3 sec hold/squeeze x 15 reps  Manual resistance Leg press 2 sets of 15 reps PROM hip ER/IR x 20 reps    Pt educated throughout session about proper posture and technique with exercises. Improved exercise technique, movement at target joints, use of target muscles after min to mod verbal, visual, tactile cues.   PATIENT EDUCATION: Education details: PT plan of care/use of resistive bands Person educated: Patient Education method: Explanation, Demonstration, Tactile cues, and Verbal cues Education comprehension: verbalized understanding, returned demonstration, verbal cues required, tactile cues required, and needs further education   HOME EXERCISE PROGRAM: No changes at this time  Access Code: [facility] URL: [email or web address] Date: 11/23/2021 Prepared by: Precious Bard Exercises - Seated Gluteal Sets - 2 x daily - 7 x weekly - 2 sets - 10 reps - 5 hold - Seated Quad Set - 2 x daily - 7 x weekly - 2 sets - 10 reps - 5 hold - Seated Long Arc Quad - 2 x daily - 7 x weekly - 2 sets - 10 reps - 5 hold - Seated March - 2 x daily - 7 x weekly - 2 sets - 10 reps - 5 hold - Seated Hip Abduction - 2 x daily - 7 x weekly - 2 sets - 10 reps - 5 hold - Seated Shoulder Shrugs - 2 x daily - 7 x weekly - 2 sets - 10 reps - 5 hold - Wheelchair Pressure Relief - 2 x daily - 7 x weekly - 2 sets - 10 reps - 5 hold  GOALS: Goals reviewed with patient? Yes  SHORT TERM GOALS: Target date: 02/22/2022  Pt will be independent with initial UE strengthening HEP in order to improve strength and balance in order to decrease fall risk and improve function at home and work. Baseline: 10/19/2021- patient with no formal UE HEP; 12/02/2021= Patient verbalized knowledge of HEP including use of theraband for UE strengthening.  Goal status: GOAL MET  LONG TERM GOALS: Target date: 06/30/2022  Pt will be independent with final for UE/LE HEP in order to improve strength and balance in order to  decrease fall risk and improve function at home and work. Baseline: Patient is currently BLE NWB and unable to participate in HEP. Has order for UE strengthening. 01/11/2022- Patient now able to participate in LE strengthening although NWB still- good understanding for some basic exercises. Will keep goal active to incorporate progressive LE strengthening exercises. 04/07/2022= Patient still participating in progressive LE seated HEP and has no questions at this time.  Goal status: MET  2.  Pt will improve FOTO to target score of 35  to display perceived improvements in ability to complete ADL's.  Baseline: 10/19/2021= 12;  12/02/2021= 17; 01/11/2022=17; 04/07/2022= 17 Goal status: Ongoing  3.  Pt will increase strength of B UE  by at least 1/2 MMT grade in order to demonstrate improvement in strength and function  Baseline: patient has range of 2-/5 to 4/5 BUE Strength; 12/02/2021= 4/5 except with wrist ext. 01/11/2022= 4/5 B UE strength expept for wrist Goal status: Partially met  4. Pt. Will increase strength of RLE by at least 1/2 MMT grade in order to demonstrate improvement in Standing/transfers.  Baseline: 2-/5 Right hip flex/knee ext/flex; 04/07/2022= 2- with right hip flex/knee ext (lacking 28 deg from zero) 4/8= Patient able to ext right knee lacking 18 deg from zero.   Goal status: Progressing  5. Pt. Will demo ability to stand pivot transfer with max assist for improved functional mobility and less dependent need on mechanical device.   Baseline: Dependent on hoyer lift for all transfers; 04/07/2022- Unable to test secondary to patient with recent UTI and right hand procedure - will attempt to reassess next visit. ; 04/12/22: Unable to successfully stand due to feet plates on loaned power w/c; 05/10/2022- Patient still having to use rental chair and has not received her original power w/c back to practice SPT.   Goal status: ONGOING  6. Pt. Will demonstrate improved functional LE strength as seen by  ability to stand > 2 min for improved transfer ability and pregait abilities.   Baseline: not assessed today due to recent dx: UTI; 04/07/2022- Unable to test secondary to patient with recent UTI and right hand procedure - will attempt to reassess next visit; 04/12/22: Unable to successfully stand due to feet plates on loaned power w/c4/09/2022- Patient still having to use rental chair and has not received her original power w/c back to practice Standing.   Goal status: ONGOING  ASSESSMENT:  CLINICAL IMPRESSION: Patient not feeling well today so treatment focused on seated LE stretching and strengthening. She was particularly tight with left knee flex but did loosen up with stretching. Good power with left LE with leg press still significantly better than right-  Pt will continue to benefit from skilled PT services to optimize independence and reduced caregiver support.    OBJECTIVE IMPAIRMENTS Abnormal gait, decreased activity tolerance, decreased balance, decreased coordination, decreased endurance, decreased mobility, difficulty walking, decreased ROM, decreased strength, hypomobility, impaired flexibility, impaired UE functional use, postural dysfunction, and pain.   ACTIVITY LIMITATIONS carrying, lifting, bending, sitting, standing, squatting, sleeping, stairs, transfers, bed mobility, continence, bathing, toileting, dressing, self feeding, reach over head, hygiene/grooming, and caring for others  PARTICIPATION LIMITATIONS: meal prep, cleaning, laundry, medication management, personal finances, interpersonal relationship, driving, shopping, community activity, and yard work  PERSONAL FACTORS Age, Time since onset of injury/illness/exacerbation, and 1-2 comorbidities: HTN, cervical Sx  are also affecting patient's functional outcome.   REHAB POTENTIAL: Good  CLINICAL DECISION MAKING: Evolving/moderate complexity  EVALUATION COMPLEXITY: Moderate  PLAN: PT FREQUENCY: 1-2x/week  PT DURATION:  12 weeks  PLANNED INTERVENTIONS: Therapeutic exercises, Therapeutic activity, Neuromuscular re-education, Balance training, Gait training, Patient/Family education, Self Care, Joint mobilization, DME instructions, Dry Needling, Electrical stimulation, Wheelchair mobility training, Spinal mobilization, Cryotherapy, Moist heat, and Manual therapy  PLAN FOR NEXT SESSION: Continue with weight bearing activities, hoyer over to mat table and practice standing next visit and LE strengthening as appropriate.   Louis Meckel, PT Physical Therapist- [facility]  05/24/2022, 11:47 AM

## 2022-05-26 NOTE — Therapy (Signed)
Occupational Therapy Treatment Note      Patient Name: Sherry [name] MRN: [id number] DOB:May 03, 1934, 88 y.o., female Today's Date: 05/26/2022                                         PCP: Dr. Tillman Abide REFERRING PROVIDER: Dr. Tillman Abide   OT End of Session - 05/26/22 1103     Visit Number [id number]    Number of Visits 72    Date for OT Re-Evaluation 06/30/22    OT Start Time 1100    OT Stop Time 1145    OT Time Calculation (min) 45 min    Activity Tolerance Patient tolerated treatment well    Behavior During Therapy [facility] for tasks assessed/performed                  Past Medical History:  Diagnosis Date   Acute blood loss anemia    Arthritis    Cancer    skin   Central cord syndrome at C6 level of cervical spinal cord 11/29/2017   Hypertension    Protein-calorie malnutrition, severe 01/24/2018   S/P insertion of IVC (inferior vena caval) filter 01/24/2018   Tetraparesis    Past Surgical History:  Procedure Laterality Date   ANTERIOR CERVICAL DECOMP/DISCECTOMY FUSION N/A 11/29/2017   Procedure: Cervical five-six, six-seven Anterior Cervical Decompression Fusion;  Surgeon: Jadene Pierini, MD;  Location: [facility] OR;  Service: Neurosurgery;  Laterality: N/A;  Cervical five-six, six-seven Anterior Cervical Decompression Fusion   CATARACT EXTRACTION     FEMUR IM NAIL Left 08/16/2021   Procedure: INTRAMEDULLARY (IM) NAIL FEMORAL;  Surgeon: Deeann Saint, MD;  Location: [facility] ORS;  Service: Orthopedics;  Laterality: Left;   IR IVC FILTER PLMT / S&I /IMG GUID/MOD SED  12/08/2017   Patient Active Problem List   Diagnosis Date Noted   Hypertensive heart disease with other congestive heart failure 02/14/2022   Chronic indwelling Foley catheter 02/14/2022   Closed hip fracture 08/15/2021   DVT (deep venous thrombosis) 08/15/2021   Quadriplegia 08/15/2021   Fall 08/15/2021   Constipation due to slow transit 08/31/2018   Trauma 06/05/2018   Neuropathic pain 06/05/2018    Neurogenic bowel 06/05/2018   Vaginal yeast infection 01/30/2018   Healthcare-associated pneumonia 01/25/2018   Chronic allergic rhinitis 01/24/2018   Depression with anxiety 01/24/2018   UTI due to Klebsiella species 01/24/2018   Protein-calorie malnutrition, severe 01/24/2018   S/P insertion of IVC (inferior vena caval) filter 01/24/2018   Tetraparesis 01/20/2018   Neurogenic bladder 01/20/2018   Reactive depression    Benign essential HTN    Acute postoperative anemia due to expected blood loss    Central cord syndrome at C6 level of cervical spinal cord 11/29/2017   Allergy to alpha-gal 11/25/2016   SCC (squamous cell carcinoma) 04/08/2014    REFERRING DIAG: Central Cord Syndrome at C6 of the Cervical Spinal Cord, Fall with Bilateral Closed Hip Fractures, with ORIF repair with Intramedullary Nailing of the Left Hip Fracture.  THERAPY DIAG:  Muscle weakness (generalized)  Other lack of coordination  Central cord syndrome, sequela  Rationale for Evaluation and Treatment Rehabilitation  PERTINENT HISTORY: Patient s/p fall November 29, 2017 resulting in diagnosis of central cord syndrome at C 6 level.  She has had therapy in multiple venues but with recent move to [facility], therapy staff recommended she seek outpatient therapy for  her needs. Pt. Had a recent fall with Bilateral Closed Hip Fractures, with ORIF repair with Intramedullary Nailing of the Left Hip Fracture.   PRECAUTIONS: None  SUBJECTIVE:   Pt reports she has been watering her and her husbands plants.   PAIN:  Are you having pain? No  OBJECTIVE:    Goals reviewed and measurements obtained.    Therapeutic Ex.   Pt. tolerated PROM followed by AROM bilateral wrist extension, PROM for bilateral digit MP, PIP, and DIP flexion, and extension, thumb radial, and palmar abduction.   PATIENT EDUCATION: RUE Distal ROM, and strength Person educated: Patient Education method: Explanation and  Demonstration Education comprehension: verbalized understanding, returned demonstration, and needs further education   HOME EXERCISE PROGRAM Continue with ongoing HEPs  Measurements:  12/09/2021:  Shoulder flexion: R: 120(136), L: 138(150) Shoulder abduction: R: 108(120), L: 110(120) Elbow: R: 0-148, L: 0-146 Wrist flexion: R: 55(62), L: 78 Writ extension: R: 34(55), L: 3(4) RD: R: 12(20), L: 16(26) UD: R: 8(24), L: 6(18) Thumb radial abduction: R: 11(20), L: 8(20) Digit flexion to the [facility]: R:  2nd: 5cm(3.5cm), 3rd: 7.5 cm(5cm), 4th: 7cm(3cm), 5th: 6cm(5.5cm) L: 2nd: 8cm(8cm), 3rd: 8cm(8cm), 4th: 7.5cm(7cm), 5th: 7cm(7cm)  02/03/2022:   Shoulder flexion: R: 122(138), L: 148(154) Shoulder abduction: R: 109(125), L: 125(125 Elbow: R: 0-148, L: 0-146 Wrist flexion: R: 60(68), L: 79 Writ extension: R: 40(55), L: 3(10) RD: R: 14(24), L: 20(28) UD: R: 8(24), L: 6(18) Thumb radial abduction: R: 20(25), L: 8(20) Digit flexion to the [facility]: R:  2nd: 4.5cm(3cm), 3rd: 6.5cm(4.5cm), 4th: 6.5 cm(3cm), 5th: 4.5cm(5cm) L: 2nd: 8cm(8cm), 3rd: 8cm(7cm), 4th: 7.5cm(7cm), 5th: 6.5cm(6.5cm)   04/07/2022:   Shoulder flexion: R: 125(150), L: 160(160) Shoulder abduction: R: 109(125), L: 125(125 Elbow: R: 0-148, L: 0-146 Wrist flexion: R: 60(68), L: 79 Writ extension: R: 40(55), L: 5(18) RD: R: 14(24), L: 20(28) UD: R: 18(24), L: 8(18) Thumb radial abduction: R: 20(25), L: 8(20) Digit flexion to the [facility]: R:  2nd: 4 cm(4cm), 3rd: 6.5cm(4cm), 4th: 7 cm (5cm), 5th: 6cm(5cm) L: 2nd: 8cm(8cm), 3rd: 8cm(7cm), 4th: 7.5cm(7cm), 5th: 7cm(7cm)   05/26/2022:   Shoulder flexion: R: 125(150), L: 165(165) Shoulder abduction: R: 109(125), L: 125(125 Elbow: R: 0-148, L: 0-150 Wrist flexion: R: 60(68), L: 80 Writ extension: R: 50(55), L: 5(18) RD: R: 14(24), L: 20(28) UD: R: 18(24), L: 8(18) Thumb radial abduction: R: 20(25), L: 8(20) Digit flexion to the [facility]: R:  2nd: 4 cm(4cm), 3rd: 6.5cm(4cm),  4th: 6 cm (5cm), 5th: 6cm(5cm) L: 2nd: 7cm(8cm), 3rd: 7cm(7cm), 4th: 7cm(7cm), 5th: 7cm(7cm)          OT Long Term Goals - 07/06/21 1106                                                                                          Target Date: 06/30/2022    OT LONG TERM GOAL #1   Title Patient and caregiver will demonstrate understanding of home exercise program for ROM.    Baseline 12/09/2021: Pt. Reports the restorative ROM program has not resumed at the facility. 10/28/2021: Pt. Is attempting exercises/stretches at home. The facility restorative aides have not resumed  exercises. 09/14/2021:  Pt. Reports inconsistencies with restorative ROM at  her care home. Pt/caregiver education to be provided about ongoing ROM needs.    Time 12    Period Weeks    Status Deferred     OT LONG TERM GOAL #2   Title Pt.  will improve BUE strength to be able to sustain her UE's in elevation to efficiently, and effectively perform hair care, and self-grooming tasks.   Baseline 02/03/2022: Pt. has improved, and is now able to sustain her Bilateral UEs in elevation long enough to efficiently, and effectively perform haircare, and self-grooming tasks. 12/09/2021: right shoulder 4-/5, left shoulder 4/5. Pt. has improved to be able to reach up for hair care, and self-grooming tasks. 10/28/2021: Right shoulder 3+/5, Left shoulder 4-/5 09/14/2021:  BUE shoulder strength: 3+/5 for shoulder flexion, and abduction.   Time 12    Period Weeks    Status Achieved               OT LONG TERM GOAL #3   Title Patient will demonstrate improved composite finger flexion to hold deodorant to apply to underarms.    Baseline 05/26/22: improved with R 4th digit and L 2, 3, 4 digits. 04/07/2022: Pt. Has improved with right 2nd, and 3rd digit flexion towards the [facility] Pt. Continues to have difficulty securely holding and applying deodorant.02/03/2022: Pt. Has improved with digit flexion, however continues to have difficulty securely holding and  using deoderant. 12/09/2021: Bilateral hand/digit MP, PIP, and DIP extension tightness limits her ability achieve digit flexion to hold, and apply deodorant. 10/28/2021:  Pt. Continues to have difficulty holding the deodorant. 01/12/2021: Pt. Presents with limited digit extension. Pt. Is able to initiate holding deodorant, however is unable to hold it while using it.   Time 12    Period Weeks    Status Ongoing   Target Date                  OT LONG TERM GOAL #4   Title Pt. Will improve bilateral wrist extension in preparation for anticipating, and initiating reaching for objects at the table.    Baseline 05/26/22: R 50(55). 04/07/2022: Rght: 40(55) Left: 5(18) 02/03/2022: Right: 34(55) Left: 3(4) 12/09/2021: Right  34(55)  Left  3(4) 10/28/2021:  Right 22(38), Left 0(15) 09/14/2021:  Right: 17(35), left 2(15)   Time 12   Period weeks   Status Ongoing   Target Date      OT LONG TERM GOAL #5   Title Pt. will write, and sign her name with 100% legibility, and modified independence.    Baseline 05/26/22: 90% legibility to sign name with small whiteboard marker. 04/07/2022: Pt. Reports that she continues to present with shaky appearing writing. Pt.reports that she is writing more when filling out her menu, and complete puzzles 02/03/2022: Pt. Continues to report legibility appears "shaky" when attempting signing her name. Pt.reports that she is writing more when filling out her menu, and complete puzzles.12/09/2021: Pt. Reports legibility appears "shaky" when attempting signing her name. Pt.reports that she is writing more when filling out her menu. 10/28/2021: pt. is able to sign her name, however legibility continues to be inconsistent. Pt. Reports "shaky appearance". 09/14/2021: Pt. continues to fill out her daily menu and complete puzzles. Pt. Continues to present with limited writing legibility.   Time 12    Period Weeks    Status  Ongoing         OT LONG TERM GOAL  #6  TITLE Pt. will turn pages in  a book with modified independence.    Baseline 02/03/2022: Pt. Is able to effectively turn pages of a book. 10/28/2021: Pt. Is able to consistently turn book pages. 09/14/2021: Pt. has difficulty turning pages.    Time 12    Period Weeks    Status Achieved       OT LONG TERM GOAL  #7  TITLE Pt. Will increase  bilateral lateral pinch strength by 2 lbs to be able to securely grasp items during ADLs, and IADL tasks.   Baseline 05/26/22: R 7#, L 4#. 04/07/2022: NT-Pinch meter out for calibration. 02/03/2022: Right: 6#, Left: 4# 12/09/2021:  Right: 6#, Left: 4#   Time 12  Period Weeks  Status Ongoing           OT LONG TERM GOAL  #8  TITLE Pt. Will button a sweater with large 1" buttons, and zip her jacket with modified independence.   Baseline 05/26/22: connects zipper but difficulty pulling up. 04/07/2022: Pt. Has difficulty manipulating buttons, however was introduced to a buttonhook. Pt. Was able to hold, and use a buttonhook for buttons on a shirt. 02/03/2022: Pt. is unable to manipulate buttons on her sweater, or zippers on her jacket.  Time 12  Period Weeks  Status Ongoing   OT LONG TERM GOAL  #9  TITLE Pt. will complete plant care with modified independence.  Baseline 05/26/22: MOD I to water accessible plants, difficulty reaching window plants. 04/07/2022: Pt. is now able to hold a cup and water her plants.02/03/2022: Pt. has difficulty caring for her plants.   Time 12  Period Weeks  Status Ongoing   OT LONG TERM GOAL  #10  TITLE Pt. will demonstrate adaptive techniques to assist with the efficiency of self-dressing, or morning care tasks.  Baseline 05/26/22: setup for tooth brushing and face washing, assist for UB/LB dressing. 04/07/2022: Pt. Continues to require assist with the efficiency of self-dressing,a nd morning care tasks.02/03/2022: Pt. requires assist from caregivers 2/2 time limitations during morning care.   Time 12  Period Weeks  Status Ongoing     Plan - 07/22/21 1737      Clinical Impression Statement Goals reviewed and measurements obtained. Improved R wrist extension 50(55) and L digit flexion to the [facility] for 2nd, 3rd, and 4th digits. R lateral pinch strength improved by 1#. Pt. continues to  tolerate distal bilateral wrist, and digit  ROM well. Pt. continues to present with tightness at the bilateral thumb webspace, and bilateral wrist flexor tightness, as well as digit MP, PIP, and DIP extensor tightness which continues to limit her ability to complete ADL tasks efficiently. Pt. continues to benefit from working on impoving ROM in order to work towards increasing bilateral hand grasp on objects, and increase engagement of bilateral hands during ADLs, and IADL tasks.                OT Occupational Profile and History Detailed Assessment- Review of Records and additional review of physical, cognitive, psychosocial history related to current functional performance    Occupational performance deficits (Please refer to evaluation for details): ADL's;IADL's;Leisure;Social Participation    Body Structure / Function / Physical Skills ADL;Continence;Dexterity;Flexibility;Strength;ROM;Balance;Coordination;FMC;IADL;Endurance;UE functional use;Decreased knowledge of use of DME;[facility]    Psychosocial Skills Coping Strategies    Rehab Potential Fair    Clinical Decision Making Several treatment options, min-mod task modification necessary    Comorbidities Affecting Occupational Performance: Presence of comorbidities impacting occupational performance    Comorbidities  impacting occupational performance description: contractures of bilateral hands, dependent transfers,    Modification or Assistance to Complete Evaluation  No modification of tasks or assist necessary to complete eval    OT Frequency 2x / week    OT Duration 12 weeks    OT Treatment/Interventions Self-care/ADL training;Cryotherapy;Therapeutic exercise;DME and/or AE instruction;Balance training;Neuromuscular  education;Manual Therapy;Splinting;Moist Heat;Passive range of motion;Therapeutic activities;Patient/family education    Plan continue to progress ROM of digits, wrists and shoulders as it pertains to completion of ADL tasks that pt values.    Consulted and Agree with Plan of Care Patient            Kathie Dike, M.S. OTR/L  05/26/22, 11:06 AM  ascom [phone]       `

## 2022-05-27 ENCOUNTER — Ambulatory Visit: Payer: Medicare HMO | Admitting: Occupational Therapy

## 2022-05-31 ENCOUNTER — Ambulatory Visit: Payer: Medicare HMO | Admitting: Occupational Therapy

## 2022-05-31 ENCOUNTER — Ambulatory Visit: Payer: Medicare HMO

## 2022-05-31 DIAGNOSIS — S72001A Fracture of unspecified part of neck of right femur, initial encounter for closed fracture: Secondary | ICD-10-CM

## 2022-05-31 DIAGNOSIS — R278 Other lack of coordination: Secondary | ICD-10-CM

## 2022-05-31 DIAGNOSIS — R269 Unspecified abnormalities of gait and mobility: Secondary | ICD-10-CM

## 2022-05-31 DIAGNOSIS — S14129S Central cord syndrome at unspecified level of cervical spinal cord, sequela: Secondary | ICD-10-CM

## 2022-05-31 DIAGNOSIS — M6281 Muscle weakness (generalized): Secondary | ICD-10-CM

## 2022-05-31 DIAGNOSIS — R262 Difficulty in walking, not elsewhere classified: Secondary | ICD-10-CM

## 2022-05-31 NOTE — Therapy (Signed)
OUTPATIENT PHYSICAL THERAPY NEURO TREATMENT   Patient Name: Sherry [name] MRN: [id number] DOB:December 08, 1934, 88 y.o., female Today's Date: 05/31/2022   PCP: Sherry Mealing, MD REFERRING PROVIDER: Deeann [name]   PT End of Session - 05/31/22 1254     Visit Number 45    Number of Visits 58    Date for PT Re-Evaluation 06/30/22    Authorization Time Period Initial cert [id number]/[id number]; Recert 01/11/2022-04/05/2022; Recert 04/07/2022-06/30/2022    Progress Note Due on Visit 50    PT Start Time 1144    PT Stop Time 1207    PT Time Calculation (min) 23 min    Equipment Utilized During Treatment Gait belt    Activity Tolerance Patient tolerated treatment well    Behavior During Therapy WFL for tasks assessed/performed                           Past Medical History:  Diagnosis Date   Acute blood loss anemia    Arthritis    Cancer (HCC)    skin   Central cord syndrome at C6 level of cervical spinal cord (HCC) 11/29/2017   Hypertension    Protein-calorie malnutrition, severe (HCC) 01/24/2018   S/P insertion of IVC (inferior vena caval) filter 01/24/2018   Tetraparesis (HCC)    Past Surgical History:  Procedure Laterality Date   ANTERIOR CERVICAL DECOMP/DISCECTOMY FUSION N/A 11/29/2017   Procedure: Cervical five-six, six-seven Anterior Cervical Decompression Fusion;  Surgeon: Sherry Pierini, MD;  Location: [facility] OR;  Service: Neurosurgery;  Laterality: N/A;  Cervical five-six, six-seven Anterior Cervical Decompression Fusion   CATARACT EXTRACTION     FEMUR IM NAIL Left 08/16/2021   Procedure: INTRAMEDULLARY (IM) NAIL FEMORAL;  Surgeon: Sherry Saint, MD;  Location: [facility] ORS;  Service: Orthopedics;  Laterality: Left;   IR IVC FILTER PLMT / S&I /IMG GUID/MOD SED  12/08/2017   Patient Active Problem List   Diagnosis Date Noted   Hypertensive heart disease with other congestive heart failure (HCC) 02/14/2022   Chronic indwelling Foley catheter 02/14/2022    Closed hip fracture (HCC) 08/15/2021   DVT (deep venous thrombosis) (HCC) 08/15/2021   Quadriplegia (HCC) 08/15/2021   Fall 08/15/2021   Constipation due to slow transit 08/31/2018   Trauma 06/05/2018   Neuropathic pain 06/05/2018   Neurogenic bowel 06/05/2018   Vaginal yeast infection 01/30/2018   Healthcare-associated pneumonia 01/25/2018   Chronic allergic rhinitis 01/24/2018   Depression with anxiety 01/24/2018   UTI due to Klebsiella species 01/24/2018   Protein-calorie malnutrition, severe (HCC) 01/24/2018   S/P insertion of IVC (inferior vena caval) filter 01/24/2018   Tetraparesis (HCC) 01/20/2018   Neurogenic bladder 01/20/2018   Reactive depression    Benign essential HTN    Acute postoperative anemia due to expected blood loss    Central cord syndrome at C6 level of cervical spinal cord (HCC) 11/29/2017   Allergy to alpha-gal 11/25/2016   SCC (squamous cell carcinoma) 04/08/2014    ONSET DATE: 08/15/2021 (fall with B hip fx); Initial injury was in 2019- quadraplegia due to central cord syndrome of C6 secondary to neck fracture.   REFERRING DIAG: Bilateral Hip fx; Left Open- s/p Left IM nail femoral on 08/16/2021; Right closed  THERAPY DIAG:  Muscle weakness (generalized)  Other lack of coordination  Central cord syndrome, sequela (HCC)  Abnormality of gait and mobility  Difficulty in walking, not elsewhere classified  Closed fracture of both hips, initial encounter (HCC)  Rationale for  Evaluation and Treatment Rehabilitation  SUBJECTIVE:   SUBJECTIVE STATEMENT:    Patient reports feeling better with no issues today. Agreeable to try to stand.   Pt accompanied by:  Paid caregiver-   PERTINENT HISTORY: Patient was previous patient (see previous episode for specifics) She experienced a fall at home on 08/15/2021 and was in Hospital from 08/15/2021- 08/20/2021.  Most recent history per Dr. Shonna [name] on 08/20/2021.  Sherry [name] is a 88 y.o. female with  medical history significant of quadriplegia due to central cord syndrome of C6 secondary to neck fracture, hypertension, depression with anxiety, neurogenic bladder, bilateral DVT on Xarelto (s/p of IVC filter placement), who presents with fall and hip pain.   Patient has history of quadriplegia, and has been doing PT/OT with improvement of function. Pt has some use of both arms and can feed herself. She can walk a little bit with assistance. Pt accidentally fell at about 930 this morning after taking shower.  She injured her hips. Hospital Course:    Closed left hip fracture [facility]) s/p mechanical fall Closed right hip fracture (?old) Acute blood loss anemia --pt is s/p  INTRAMEDULLARY (IM) NAIL FEMORAL-left on 7/16  PAIN:  Are you having pain? NO: NPRS scale: 0/10 Pain location: Right hip Pain description: Ache Aggravating factors: any active Movement of right side Relieving factors: Rest  PRECAUTIONS: Fall  WEIGHT BEARING RESTRICTIONS Yes Bilateral non-weight bearing B LE's  FALLS: Has patient fallen in last 6 months? Yes. Number of falls 1  LIVING ENVIRONMENT: Lives with: lives with their family and lives in a nursing home Lives in: Other Long term care at [facility] Stairs: No Has following equipment at home: Wheelchair (power), Ramped entry, and hoyer lift  PLOF: Needs assistance with ADLs, Needs assistance with homemaking, Needs assistance with gait, Needs assistance with transfers, and Leisure: Work puzzles, read, Watch TV  PATIENT GOALS Patient goal is to walk; Be able to strengthen my arms for better use  OBJECTIVE:   DIAGNOSTIC FINDINGS: Overall cognitive statusEXAM: DG HIP (WITH OR WITHOUT PELVIS) 2-3V LEFT; DG HIP (WITH OR WITHOUT PELVIS) 2-3V RIGHT   COMPARISON:  CT of the abdomen and pelvis 01/21/2020   FINDINGS: Left proximal femur fracture noted just below the intertrochanteric region. Marked ventral angulation noted on the cross-table image. Slight varus  angulation present   Comminuted intratrochanteric fracture present on the right also with marked ventral angulation.   IMPRESSION: 1. Bilateral hip fractures. 2. Comminuted intertrochanteric fracture of the right hip. 3. Angulated fracture of the proximal left femur just below the intertrochanteric region.     Electronically Signed   By: Marin Roberts M.D.   On: 08/15/2021 11:54:   COGNITION:  Within functional limits for tasks assessed   SENSATION: Light touch: Impaired   EDEMA:  None  Strength R/L 2-/2- Shoulder flexion (anterior deltoid/pec major/coracobrachialis, axillary n. (C5-6) and musculocutaneous n. (C5-7)) 2-/3+ (within available ROM around 130 on lef)Shoulder abduction (deltoid/supraspinatus, axillary/suprascapular n, C5) 3+/3+ Shoulder external rotation (infraspinatus/teres minor) 4/4 Shoulder internal rotation (subcapularis/lats/pec major) 3+/3+ Shoulder extension (posterior deltoid, lats, teres major, axillary/thoracodorsal n.) 3+/3+ Shoulder horizontal abduction 4/4 Elbow flexion (biceps brachii, brachialis, brachioradialis, musculoskeletal n, C5-6) 4/4 Elbow extension (triceps, radial n, C7) 2-/2- Wrist Extension 3+/3+ Wrist Flexion   LOWER EXTREMITY MMT:    MMT Right 01/11/22 Left 01/11/22  Hip flexion 2- 2-  Hip extension 2- 2-  Hip abduction 2- 2-  Hip adduction 2- 3  Hip internal rotation 2- 2-  Hip external rotation 2- 2-  Knee flexion 2- 2-  Knee extension 2+ 3+  Ankle dorsiflexion 2- NT - boot  Ankle plantarflexion 2- NT- boot  Ankle inversion 2- NT- boot  Ankle eversion 2- NT- boot  (Blank rows = not tested)  BED MOBILITY:  Unable to test due to NWB status of BLE- Patient currently dependent with all bed mobility using hoyer and caregiver assist  TRANSFERS: Currently BLE NWB- Dependent on use of hoyer for all transfers  FUNCTIONAL TESTs:   PATIENT SURVEYS:  FOTO 12  TODAY'S TREATMENT:  THERE.EX:  Sit to stand  x  4 trials with max assist from power w/c  Anterior weight shift x 10 reps Patient performed 3 trials of standing today in // bars with max assist 1) 45 sec 2) 7 sec 3) 41 sec-  Patient focused on ant weight shift attempting to position hips over feet and activate glutes as able. On last stand patient reported very fatigued and unable to continue    Pt educated throughout session about proper posture and technique with exercises. Improved exercise technique, movement at target joints, use of target muscles after min to mod verbal, visual, tactile cues.   PATIENT EDUCATION: Education details: PT plan of care/use of resistive bands Person educated: Patient Education method: Explanation, Demonstration, Tactile cues, and Verbal cues Education comprehension: verbalized understanding, returned demonstration, verbal cues required, tactile cues required, and needs further education   HOME EXERCISE PROGRAM: No changes at this time  Access Code: [facility] URL: [email or web address] Date: 11/23/2021 Prepared by: Precious Bard Exercises - Seated Gluteal Sets - 2 x daily - 7 x weekly - 2 sets - 10 reps - 5 hold - Seated Quad Set - 2 x daily - 7 x weekly - 2 sets - 10 reps - 5 hold - Seated Long Arc Quad - 2 x daily - 7 x weekly - 2 sets - 10 reps - 5 hold - Seated March - 2 x daily - 7 x weekly - 2 sets - 10 reps - 5 hold - Seated Hip Abduction - 2 x daily - 7 x weekly - 2 sets - 10 reps - 5 hold - Seated Shoulder Shrugs - 2 x daily - 7 x weekly - 2 sets - 10 reps - 5 hold - Wheelchair Pressure Relief - 2 x daily - 7 x weekly - 2 sets - 10 reps - 5 hold  GOALS: Goals reviewed with patient? Yes  SHORT TERM GOALS: Target date: 02/22/2022  Pt will be independent with initial UE strengthening HEP in order to improve strength and balance in order to decrease fall risk and improve function at home and work. Baseline: 10/19/2021- patient with no formal UE HEP; 12/02/2021= Patient verbalized  knowledge of HEP including use of theraband for UE strengthening.  Goal status: GOAL MET  LONG TERM GOALS: Target date: 06/30/2022  Pt will be independent with final for UE/LE HEP in order to improve strength and balance in order to decrease fall risk and improve function at home and work. Baseline: Patient is currently BLE NWB and unable to participate in HEP. Has order for UE strengthening. 01/11/2022- Patient now able to participate in LE strengthening although NWB still- good understanding for some basic exercises. Will keep goal active to incorporate progressive LE strengthening exercises. 04/07/2022= Patient still participating in progressive LE seated HEP and has no questions at this time.  Goal status: MET  2.  Pt will improve FOTO to target score  of 35  to display perceived improvements in ability to complete ADL's.  Baseline: 10/19/2021= 12; 12/02/2021= 17; 01/11/2022=17; 04/07/2022= 17 Goal status: Ongoing  3.  Pt will increase strength of B UE  by at least 1/2 MMT grade in order to demonstrate improvement in strength and function  Baseline: patient has range of 2-/5 to 4/5 BUE Strength; 12/02/2021= 4/5 except with wrist ext. 01/11/2022= 4/5 B UE strength expept for wrist Goal status: Partially met  4. Pt. Will increase strength of RLE by at least 1/2 MMT grade in order to demonstrate improvement in Standing/transfers.  Baseline: 2-/5 Right hip flex/knee ext/flex; 04/07/2022= 2- with right hip flex/knee ext (lacking 28 deg from zero) 4/8= Patient able to ext right knee lacking 18 deg from zero.   Goal status: Progressing  5. Pt. Will demo ability to stand pivot transfer with max assist for improved functional mobility and less dependent need on mechanical device.   Baseline: Dependent on hoyer lift for all transfers; 04/07/2022- Unable to test secondary to patient with recent UTI and right hand procedure - will attempt to reassess next visit. ; 04/12/22: Unable to successfully stand due to feet  plates on loaned power w/c; 05/10/2022- Patient still having to use rental chair and has not received her original power w/c back to practice SPT.   Goal status: ONGOING  6. Pt. Will demonstrate improved functional LE strength as seen by ability to stand > 2 min for improved transfer ability and pregait abilities.   Baseline: not assessed today due to recent dx: UTI; 04/07/2022- Unable to test secondary to patient with recent UTI and right hand procedure - will attempt to reassess next visit; 04/12/22: Unable to successfully stand due to feet plates on loaned power w/c4/09/2022- Patient still having to use rental chair and has not received her original power w/c back to practice Standing.   Goal status: ONGOING  ASSESSMENT:  CLINICAL IMPRESSION: Patient was able to stand better today- able to displace weight over toes/hip better on 2nd and 3rd trial. She continues to fatigue very quickly most likely related to lack of standing in past several months and will continue to address this in future visits. Pt will continue to benefit from skilled PT services to optimize independence and reduced caregiver support.    OBJECTIVE IMPAIRMENTS Abnormal gait, decreased activity tolerance, decreased balance, decreased coordination, decreased endurance, decreased mobility, difficulty walking, decreased ROM, decreased strength, hypomobility, impaired flexibility, impaired UE functional use, postural dysfunction, and pain.   ACTIVITY LIMITATIONS carrying, lifting, bending, sitting, standing, squatting, sleeping, stairs, transfers, bed mobility, continence, bathing, toileting, dressing, self feeding, reach over head, hygiene/grooming, and caring for others  PARTICIPATION LIMITATIONS: meal prep, cleaning, laundry, medication management, personal finances, interpersonal relationship, driving, shopping, community activity, and yard work  PERSONAL FACTORS Age, Time since onset of injury/illness/exacerbation, and 1-2  comorbidities: HTN, cervical Sx  are also affecting patient's functional outcome.   REHAB POTENTIAL: Good  CLINICAL DECISION MAKING: Evolving/moderate complexity  EVALUATION COMPLEXITY: Moderate  PLAN: PT FREQUENCY: 1-2x/week  PT DURATION: 12 weeks  PLANNED INTERVENTIONS: Therapeutic exercises, Therapeutic activity, Neuromuscular re-education, Balance training, Gait training, Patient/Family education, Self Care, Joint mobilization, DME instructions, Dry Needling, Electrical stimulation, Wheelchair mobility training, Spinal mobilization, Cryotherapy, Moist heat, and Manual therapy  PLAN FOR NEXT SESSION: Continue with weight bearing activities, hoyer over to mat table and practice standing next visit and LE strengthening as appropriate.   Louis Meckel, PT Physical Therapist- [facility]  05/31/2022,  12:55 PM

## 2022-05-31 NOTE — Therapy (Signed)
Occupational Therapy Treatment Note      Patient Name: Sherry [name] MRN: [id number] DOB:11-01-1934, 88 y.o., female Today's Date: 05/31/2022                                         PCP: Dr. Tillman Abide REFERRING PROVIDER: Dr. Tillman Abide   OT End of Session - 05/31/22 1553     Visit Number [id number]    Number of Visits 72    Date for OT Re-Evaluation 06/30/22    Authorization Time Period Progress report period starting 12/14/2021    OT Start Time 1104    OT Stop Time 1145    OT Time Calculation (min) 41 min    Activity Tolerance Patient tolerated treatment well    Behavior During Therapy [employer] for tasks assessed/performed                  Past Medical History:  Diagnosis Date   Acute blood loss anemia    Arthritis    Cancer (HCC)    skin   Central cord syndrome at C6 level of cervical spinal cord (HCC) 11/29/2017   Hypertension    Protein-calorie malnutrition, severe (HCC) 01/24/2018   S/P insertion of IVC (inferior vena caval) filter 01/24/2018   Tetraparesis (HCC)    Past Surgical History:  Procedure Laterality Date   ANTERIOR CERVICAL DECOMP/DISCECTOMY FUSION N/A 11/29/2017   Procedure: Cervical five-six, six-seven Anterior Cervical Decompression Fusion;  Surgeon: Jadene Pierini, MD;  Location: [facility] OR;  Service: Neurosurgery;  Laterality: N/A;  Cervical five-six, six-seven Anterior Cervical Decompression Fusion   CATARACT EXTRACTION     FEMUR IM NAIL Left 08/16/2021   Procedure: INTRAMEDULLARY (IM) NAIL FEMORAL;  Surgeon: Deeann Saint, MD;  Location: [facility] ORS;  Service: Orthopedics;  Laterality: Left;   IR IVC FILTER PLMT / S&I /IMG GUID/MOD SED  12/08/2017   Patient Active Problem List   Diagnosis Date Noted   Hypertensive heart disease with other congestive heart failure (HCC) 02/14/2022   Chronic indwelling Foley catheter 02/14/2022   Closed hip fracture (HCC) 08/15/2021   DVT (deep venous thrombosis) (HCC) 08/15/2021   Quadriplegia (HCC)  08/15/2021   Fall 08/15/2021   Constipation due to slow transit 08/31/2018   Trauma 06/05/2018   Neuropathic pain 06/05/2018   Neurogenic bowel 06/05/2018   Vaginal yeast infection 01/30/2018   Healthcare-associated pneumonia 01/25/2018   Chronic allergic rhinitis 01/24/2018   Depression with anxiety 01/24/2018   UTI due to Klebsiella species 01/24/2018   Protein-calorie malnutrition, severe (HCC) 01/24/2018   S/P insertion of IVC (inferior vena caval) filter 01/24/2018   Tetraparesis (HCC) 01/20/2018   Neurogenic bladder 01/20/2018   Reactive depression    Benign essential HTN    Acute postoperative anemia due to expected blood loss    Central cord syndrome at C6 level of cervical spinal cord (HCC) 11/29/2017   Allergy to alpha-gal 11/25/2016   SCC (squamous cell carcinoma) 04/08/2014    REFERRING DIAG: Central Cord Syndrome at C6 of the Cervical Spinal Cord, Fall with Bilateral Closed Hip Fractures, with ORIF repair with Intramedullary Nailing of the Left Hip Fracture.  THERAPY DIAG:  Muscle weakness (generalized)  Rationale for Evaluation and Treatment Rehabilitation  PERTINENT HISTORY: Patient s/p fall November 29, 2017 resulting in diagnosis of central cord syndrome at C 6 level.  She has had therapy in multiple venues but with recent  move to [facility], therapy staff recommended she seek outpatient therapy for her needs. Pt. Had a recent fall with Bilateral Closed Hip Fractures, with ORIF repair with Intramedullary Nailing of the Left Hip Fracture.   PRECAUTIONS: None  SUBJECTIVE:   Pt. reports doing well today.  PAIN:  Are you having pain? No  OBJECTIVE:    Manual Therapy:   Pt. tolerated soft tissue massage to the volar, and dorsal surface of each digit on the bilateral hands, and thenar eminence secondary to stiffness following moist heat modality. Soft tissue mobilization was performed for Carpal, and metacarpal spread stretches. Manual therapy was performed  independent of, and in preparation for therapeutic Ex.   Therapeutic Ex.    Pt. tolerated PROM followed by AROM bilateral wrist extension, PROM for bilateral digit MP, PIP, and DIP flexion, and extension, thumb radial, and palmar abduction.  Pt. worked on [employer] using a 2# dowel weight for bilateral shoulder flexion, chest press, and shoulder flexion.  PATIENT EDUCATION: RUE Distal ROM, and strength Person educated: Patient Education method: Explanation and Demonstration Education comprehension: verbalized understanding, returned demonstration, and needs further education   HOME EXERCISE PROGRAM Continue with ongoing HEPs  Measurements:  12/09/2021:  Shoulder flexion: R: 120(136), L: 138(150) Shoulder abduction: R: 108(120), L: 110(120) Elbow: R: 0-148, L: 0-146 Wrist flexion: R: 55(62), L: 78 Writ extension: R: 34(55), L: 3(4) RD: R: 12(20), L: 16(26) UD: R: 8(24), L: 6(18) Thumb radial abduction: R: 11(20), L: 8(20) Digit flexion to the [facility]: R:  2nd: 5cm(3.5cm), 3rd: 7.5 cm(5cm), 4th: 7cm(3cm), 5th: 6cm(5.5cm) L: 2nd: 8cm(8cm), 3rd: 8cm(8cm), 4th: 7.5cm(7cm), 5th: 7cm(7cm)  02/03/2022:   Shoulder flexion: R: 122(138), L: 148(154) Shoulder abduction: R: 109(125), L: 125(125 Elbow: R: 0-148, L: 0-146 Wrist flexion: R: 60(68), L: 79 Writ extension: R: 40(55), L: 3(10) RD: R: 14(24), L: 20(28) UD: R: 8(24), L: 6(18) Thumb radial abduction: R: 20(25), L: 8(20) Digit flexion to the [facility]: R:  2nd: 4.5cm(3cm), 3rd: 6.5cm(4.5cm), 4th: 6.5 cm(3cm), 5th: 4.5cm(5cm) L: 2nd: 8cm(8cm), 3rd: 8cm(7cm), 4th: 7.5cm(7cm), 5th: 6.5cm(6.5cm)   04/07/2022:   Shoulder flexion: R: 125(150), L: 160(160) Shoulder abduction: R: 109(125), L: 125(125 Elbow: R: 0-148, L: 0-146 Wrist flexion: R: 60(68), L: 79 Writ extension: R: 40(55), L: 5(18) RD: R: 14(24), L: 20(28) UD: R: 18(24), L: 8(18) Thumb radial abduction: R: 20(25), L: 8(20) Digit flexion to the [facility]: R:  2nd: 4 cm(4cm),  3rd: 6.5cm(4cm), 4th: 7 cm (5cm), 5th: 6cm(5cm) L: 2nd: 8cm(8cm), 3rd: 8cm(7cm), 4th: 7.5cm(7cm), 5th: 7cm(7cm)           OT Long Term Goals - 07/06/21 1106                                                                                          Target Date: 06/30/2022    OT LONG TERM GOAL #1   Title Patient and caregiver will demonstrate understanding of home exercise program for ROM.    Baseline 12/09/2021: Pt. Reports the restorative ROM program has not resumed at the facility. 10/28/2021: Pt. Is attempting exercises/stretches at home. The facility restorative aides have not resumed exercises. 09/14/2021:  Pt. Reports inconsistencies with restorative ROM at  her care home. Pt/caregiver education to be provided about ongoing ROM needs.    Time 12    Period Weeks    Status Deferred     OT LONG TERM GOAL #2   Title Pt.  will improve BUE strength to be able to sustain her UE's in elevation to efficiently, and effectively perform hair care, and self-grooming tasks.   Baseline 02/03/2022: Pt. has improved, and is now able to sustain her Bilateral UEs in elevation long enough to efficiently, and effectively perform haircare, and self-grooming tasks. 12/09/2021: right shoulder 4-/5, left shoulder 4/5. Pt. has improved to be able to reach up for hair care, and self-grooming tasks. 10/28/2021: Right shoulder 3+/5, Left shoulder 4-/5 09/14/2021:  BUE shoulder strength: 3+/5 for shoulder flexion, and abduction.   Time 12    Period Weeks    Status Achieved               OT LONG TERM GOAL #3   Title Patient will demonstrate improved composite finger flexion to hold deodorant to apply to underarms.    Baseline 04/07/2022: Pt. Has improved with right 2nd, and 3rd digit flexion towards the [employer]. Pt. Continues to have difficulty securely holding and applying deodorant.02/03/2022: Pt. Has improved with digit flexion, however continues to have difficulty securely holding and using deoderant. 12/09/2021: Bilateral  hand/digit MP, PIP, and DIP extension tightness limits her ability achieve digit flexion to hold, and apply deodorant. 10/28/2021:  Pt. Continues to have difficulty holding the deodorant. 01/12/2021: Pt. Presents with limited digit extension. Pt. Is able to initiate holding deodorant, however is unable to hold it while using it.   Time 12    Period Weeks    Status Ongoing   Target Date                  OT LONG TERM GOAL #4   Title Pt. Will improve bilateral wrist extension in preparation for anticipating, and initiating reaching for objects at the table.    Baseline 04/07/2022: Rght: 40(55) Left: 5(18) 02/03/2022: Right: 34(55) Left: 3(4) 12/09/2021: Right  34(55)  Left  3(4) 10/28/2021:  Right 22(38), Left 0(15) 09/14/2021:  Right: 17(35), left 2(15)   Time 12   Period weeks   Status Ongoing   Target Date      OT LONG TERM GOAL #5   Title Pt. will write, and sign her name with 100% legibility, and modified independence.    Baseline 04/07/2022: Pt. Reports that she continues to present with shaky appearing writing. Pt.reports that she is writing more when filling out her menu, and complete puzzles 02/03/2022: Pt. Continues to report legibility appears "shaky" when attempting signing her name. Pt.reports that she is writing more when filling out her menu, and complete puzzles.12/09/2021: Pt. Reports legibility appears "shaky" when attempting signing her name. Pt.reports that she is writing more when filling out her menu. 10/28/2021: pt. is able to sign her name, however legibility continues to be inconsistent. Pt. Reports "shaky appearance". 09/14/2021: Pt. continues to fill out her daily menu and complete puzzles. Pt. Continues to present with limited writing legibility.   Time 12    Period Weeks    Status  Ongoing         OT LONG TERM GOAL  #6   TITLE Pt. will turn pages in a book with modified independence.    Baseline 02/03/2022: Pt. Is able to effectively turn pages of a book. 10/28/2021:  Pt. Is  able to consistently turn book pages. 09/14/2021: Pt. has difficulty turning pages.    Time 12    Period Weeks    Status Achieved       OT LONG TERM GOAL  #7  TITLE Pt. Will increase  bilateral lateral pinch strength by 2 lbs to be able to securely grasp items during ADLs, and IADL tasks.   Baseline 04/07/2022: NT-Pinch meter out for calibration. 02/03/2022: Right: 6#, Left: 4# 12/09/2021:  Right: 6#, Left: 4#   Time 12  Period Weeks  Status Ongoing           OT LONG TERM GOAL  #8  TITLE Pt. Will button a sweater with large 1" buttons, and zip her jacket with modified independence.   Baseline 04/07/2022: Pt. Has difficulty manipulating buttons, however was introduced to a buttonhook. Pt. Was able to hold, and use a buttonhook for buttons on a shirt. 02/03/2022: Pt. is unable to manipulate buttons on her sweater, or zippers on her jacket.  Time 12  Period Weeks  Status Ongoing   OT LONG TERM GOAL  #9  TITLE Pt. will complete plant care with modified independence.  Baseline 04/07/2022: Pt. is now able to hold a cup and water her plants.02/03/2022: Pt. has difficulty caring for her plants.   Time 12  Period Weeks  Status Ongoing   OT LONG TERM GOAL  #10  TITLE Pt. will demonstrate adaptive techniques to assist with the efficiency of self-dressing, or morning care tasks.  Baseline 04/07/2022: Pt. Continues to require assist with the efficiency of self-dressing,a nd morning care tasks.02/03/2022: Pt. requires assist from caregivers 2/2 time limitations during morning care.   Time 12  Period Weeks  Status Ongoing     Plan - 07/22/21 1737     Clinical Impression Statement Pt. tolerated UE strengthening with the 2# dowel well today. Pt. continues to  tolerate distal bilateral wrist, and digit  ROM well. Pt. continues to present with tightness at the bilateral thumb webspace, and bilateral wrist flexor tightness, as well as digit MP, PIP, and DIP extensor tightness which continues to limit  her ability to complete ADL tasks efficiently. Pt. continues to benefit from working on impoving ROM in order to work towards increasing bilateral hand grasp on objects, and increase engagement of bilateral hands during ADLs, and IADL tasks.                     OT Occupational Profile and History Detailed Assessment- Review of Records and additional review of physical, cognitive, psychosocial history related to current functional performance    Occupational performance deficits (Please refer to evaluation for details): ADL's;IADL's;Leisure;Social Participation    Body Structure / Function / Physical Skills ADL;Continence;Dexterity;Flexibility;Strength;ROM;Balance;Coordination;FMC;IADL;Endurance;UE functional use;Decreased knowledge of use of DME;[facility]    Psychosocial Skills Coping Strategies    Rehab Potential Fair    Clinical Decision Making Several treatment options, min-mod task modification necessary    Comorbidities Affecting Occupational Performance: Presence of comorbidities impacting occupational performance    Comorbidities impacting occupational performance description: contractures of bilateral hands, dependent transfers,    Modification or Assistance to Complete Evaluation  No modification of tasks or assist necessary to complete eval    OT Frequency 2x / week    OT Duration 12 weeks    OT Treatment/Interventions Self-care/ADL training;Cryotherapy;Therapeutic exercise;DME and/or AE instruction;Balance training;Neuromuscular education;Manual Therapy;Splinting;Moist Heat;Passive range of motion;Therapeutic activities;Patient/family education    Plan continue to progress ROM of digits, wrists and  shoulders as it pertains to completion of ADL tasks that pt values.    Consulted and Agree with Plan of Care Patient            Olegario Messier, MS, OTR/L   05/31/2022      `

## 2022-06-02 ENCOUNTER — Ambulatory Visit: Payer: Medicare HMO | Admitting: Occupational Therapy

## 2022-06-02 ENCOUNTER — Ambulatory Visit: Payer: Medicare HMO | Attending: Internal Medicine

## 2022-06-02 DIAGNOSIS — R278 Other lack of coordination: Secondary | ICD-10-CM | POA: Diagnosis present

## 2022-06-02 DIAGNOSIS — S72002A Fracture of unspecified part of neck of left femur, initial encounter for closed fracture: Secondary | ICD-10-CM | POA: Insufficient documentation

## 2022-06-02 DIAGNOSIS — R2689 Other abnormalities of gait and mobility: Secondary | ICD-10-CM | POA: Diagnosis present

## 2022-06-02 DIAGNOSIS — S14129S Central cord syndrome at unspecified level of cervical spinal cord, sequela: Secondary | ICD-10-CM | POA: Diagnosis present

## 2022-06-02 DIAGNOSIS — R269 Unspecified abnormalities of gait and mobility: Secondary | ICD-10-CM | POA: Diagnosis present

## 2022-06-02 DIAGNOSIS — M6281 Muscle weakness (generalized): Secondary | ICD-10-CM | POA: Diagnosis present

## 2022-06-02 DIAGNOSIS — S72001A Fracture of unspecified part of neck of right femur, initial encounter for closed fracture: Secondary | ICD-10-CM

## 2022-06-02 DIAGNOSIS — R262 Difficulty in walking, not elsewhere classified: Secondary | ICD-10-CM | POA: Diagnosis present

## 2022-06-02 LAB — BASIC METABOLIC PANEL
BUN: 13 (ref 4–21)
CO2: 31 — AB (ref 13–22)
Chloride: 102 (ref 99–108)
Creatinine: 0.5 (ref 0.5–1.1)
Glucose: 76
Potassium: 4.6 mEq/L (ref 3.5–5.1)
Sodium: 140 (ref 137–147)

## 2022-06-02 LAB — COMPREHENSIVE METABOLIC PANEL
Calcium: 9 (ref 8.7–10.7)
eGFR: 87

## 2022-06-02 NOTE — Therapy (Signed)
OUTPATIENT PHYSICAL THERAPY NEURO TREATMENT   Patient Name: Sherry [name] MRN: [id number] DOB:[birth year], 88 y.o., female Today's Date: 05/31/2022   PCP: Earnestine Mealing, MD REFERRING PROVIDER: Deeann Saint   PT End of Session - 05/31/22 1254     Visit Number 45    Number of Visits 58    Date for PT Re-Evaluation 06/30/22    Authorization Time Period Initial cert [id number]; Recert 01/11/2022-04/05/2022; Recert 04/07/2022-06/30/2022    Progress Note Due on Visit 50    PT Start Time 1144    PT Stop Time 1207    PT Time Calculation (min) 23 min    Equipment Utilized During Treatment Gait belt    Activity Tolerance Patient tolerated treatment well    Behavior During Therapy WFL for tasks assessed/performed                           Past Medical History:  Diagnosis Date   Acute blood loss anemia    Arthritis    Cancer (HCC)    skin   Central cord syndrome at C6 level of cervical spinal cord (HCC) 11/29/2017   Hypertension    Protein-calorie malnutrition, severe (HCC) 01/24/2018   S/P insertion of IVC (inferior vena caval) filter 01/24/2018   Tetraparesis (HCC)    Past Surgical History:  Procedure Laterality Date   ANTERIOR CERVICAL DECOMP/DISCECTOMY FUSION N/A 11/29/2017   Procedure: Cervical five-six, six-seven Anterior Cervical Decompression Fusion;  Surgeon: Jadene Pierini, MD;  Location: [facility] OR;  Service: Neurosurgery;  Laterality: N/A;  Cervical five-six, six-seven Anterior Cervical Decompression Fusion   CATARACT EXTRACTION     FEMUR IM NAIL Left 08/16/2021   Procedure: INTRAMEDULLARY (IM) NAIL FEMORAL;  Surgeon: Deeann Saint, MD;  Location: [facility] ORS;  Service: Orthopedics;  Laterality: Left;   IR IVC FILTER PLMT / S&I /IMG GUID/MOD SED  12/08/2017   Patient Active Problem List   Diagnosis Date Noted   Hypertensive heart disease with other congestive heart failure (HCC) 02/14/2022   Chronic indwelling Foley catheter 02/14/2022    Closed hip fracture (HCC) 08/15/2021   DVT (deep venous thrombosis) (HCC) 08/15/2021   Quadriplegia (HCC) 08/15/2021   Fall 08/15/2021   Constipation due to slow transit 08/31/2018   Trauma 06/05/2018   Neuropathic pain 06/05/2018   Neurogenic bowel 06/05/2018   Vaginal yeast infection 01/30/2018   Healthcare-associated pneumonia 01/25/2018   Chronic allergic rhinitis 01/24/2018   Depression with anxiety 01/24/2018   UTI due to Klebsiella species 01/24/2018   Protein-calorie malnutrition, severe (HCC) 01/24/2018   S/P insertion of IVC (inferior vena caval) filter 01/24/2018   Tetraparesis (HCC) 01/20/2018   Neurogenic bladder 01/20/2018   Reactive depression    Benign essential HTN    Acute postoperative anemia due to expected blood loss    Central cord syndrome at C6 level of cervical spinal cord (HCC) 11/29/2017   Allergy to alpha-gal 11/25/2016   SCC (squamous cell carcinoma) 04/08/2014    ONSET DATE: 08/15/2021 (fall with B hip fx); Initial injury was in 2019- quadraplegia due to central cord syndrome of C6 secondary to neck fracture.   REFERRING DIAG: Bilateral Hip fx; Left Open- s/p Left IM nail femoral on 08/16/2021; Right closed  THERAPY DIAG:  Muscle weakness (generalized)  Other lack of coordination  Central cord syndrome, sequela (HCC)  Abnormality of gait and mobility  Difficulty in walking, not elsewhere classified  Closed fracture of both hips, initial encounter (HCC)  Rationale for  Evaluation and Treatment Rehabilitation  SUBJECTIVE:   SUBJECTIVE STATEMENT:    Patient reports some initial soreness after last session but no pain or lingering difficulties.    Pt accompanied by:  Paid caregiver-   PERTINENT HISTORY: Patient was previous patient (see previous episode for specifics) She experienced a fall at home on 08/15/2021 and was in Hospital from 08/15/2021- 08/20/2021.  Most recent history per Dr. Shonna Chock on 08/20/2021.  Sherry [name] is a 88  y.o. 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   Patient has history of quadriplegia, and has been doing PT/OT with improvement of function. Pt has some use of both arms and can feed herself. She can walk a little bit with assistance. Pt accidentally fell at about 930 this morning after taking shower.  She injured her hips. Hospital Course:    Closed left hip fracture [facility]) s/p mechanical fall Closed right hip fracture (?old) Acute blood loss anemia --pt is s/p  INTRAMEDULLARY (IM) NAIL FEMORAL-left on 7/16  PAIN:  Are you having pain? NO: NPRS scale: 0/10 Pain location: Right hip Pain description: Ache Aggravating factors: any active Movement of right side Relieving factors: Rest  PRECAUTIONS: Fall  WEIGHT BEARING RESTRICTIONS Yes Bilateral non-weight bearing B LE's  FALLS: Has patient fallen in last 6 months? Yes. Number of falls 1  LIVING ENVIRONMENT: Lives with: lives with their family and lives in a nursing home Lives in: Other Long term care at [facility] Stairs: No Has following equipment at home: Wheelchair (power), Ramped entry, and hoyer lift  PLOF: Needs assistance with ADLs, Needs assistance with homemaking, Needs assistance with gait, Needs assistance with transfers, and Leisure: Work puzzles, read, Watch TV  PATIENT GOALS Patient goal is to walk; Be able to strengthen my arms for better use  OBJECTIVE:   DIAGNOSTIC FINDINGS: Overall cognitive statusEXAM: DG HIP (WITH OR WITHOUT PELVIS) 2-3V LEFT; DG HIP (WITH OR WITHOUT PELVIS) 2-3V RIGHT   COMPARISON:  CT of the abdomen and pelvis 01/21/2020   FINDINGS: Left proximal femur fracture noted just below the intertrochanteric region. Marked ventral angulation noted on the cross-table  image. Slight varus angulation present   Comminuted intratrochanteric fracture present on the right also with marked ventral angulation.   IMPRESSION: 1. Bilateral hip fractures. 2. Comminuted intertrochanteric fracture of the right hip. 3. Angulated fracture of the proximal left femur just below the intertrochanteric region.     Electronically Signed   By: Marin Roberts M.D.   On: 08/15/2021 11:54:   COGNITION:  Within functional limits for tasks assessed   SENSATION: Light touch: Impaired   EDEMA:  None  Strength R/L 2-/2- Shoulder flexion (anterior deltoid/pec major/coracobrachialis, axillary n. (C5-6) and musculocutaneous n. (C5-7)) 2-/3+ (within available ROM around 130 on lef)Shoulder abduction (deltoid/supraspinatus, axillary/suprascapular n, C5) 3+/3+ Shoulder external rotation (infraspinatus/teres minor) 4/4 Shoulder internal rotation (subcapularis/lats/pec major) 3+/3+ Shoulder extension (posterior deltoid, lats, teres major, axillary/thoracodorsal n.) 3+/3+ Shoulder horizontal abduction 4/4 Elbow flexion (biceps brachii, brachialis, brachioradialis, musculoskeletal n, C5-6) 4/4 Elbow extension (triceps, radial n, C7) 2-/2- Wrist Extension 3+/3+ Wrist Flexion   LOWER EXTREMITY MMT:    MMT Right 01/11/22 Left 01/11/22  Hip flexion 2- 2-  Hip extension 2- 2-  Hip abduction 2- 2-  Hip adduction 2- 3  Hip internal rotation  2- 2-  Hip external rotation 2- 2-  Knee flexion 2- 2-  Knee extension 2+ 3+  Ankle dorsiflexion 2- NT - boot  Ankle plantarflexion 2- NT- boot  Ankle inversion 2- NT- boot  Ankle eversion 2- NT- boot  (Blank rows = not tested)  BED MOBILITY:  Unable to test due to NWB status of BLE- Patient currently dependent with all bed mobility using hoyer and caregiver assist  TRANSFERS: Currently BLE NWB- Dependent on use of hoyer for all transfers  FUNCTIONAL TESTs:   PATIENT SURVEYS:  FOTO 12  TODAY'S  TREATMENT:  THERE.EX: AAROM- B hip flex 2 sets x 15 reps  AROM- LAQ- Left x 3 sets with progressive resistance 1) 2.5# x 10; 2) 4# x 8, 3) 5# x 6 reps. Right LAQ= no weight x 8, 10 and 6 active reps as high as possible Manual resistance hip add squeeze- hold 3 sec x 15 Manual resistance hip abd hold- hold 3 sec x 15 reps AAROM knee flex - hold 3 sec x 15 reps   B Hip abd AAROM 2 sets  x 15 reps    Pt educated throughout session about proper posture and technique with exercises. Improved exercise technique, movement at target joints, use of target muscles after min to mod verbal, visual, tactile cues.   PATIENT EDUCATION: Education details: PT plan of care/use of resistive bands Person educated: Patient Education method: Explanation, Demonstration, Tactile cues, and Verbal cues Education comprehension: verbalized understanding, returned demonstration, verbal cues required, tactile cues required, and needs further education   HOME EXERCISE PROGRAM: No changes at this time  Access Code: [facility] URL: [email or web address] Date: 11/23/2021 Prepared by: Precious Bard Exercises - Seated Gluteal Sets - 2 x daily - 7 x weekly - 2 sets - 10 reps - 5 hold - Seated Quad Set - 2 x daily - 7 x weekly - 2 sets - 10 reps - 5 hold - Seated Long Arc Quad - 2 x daily - 7 x weekly - 2 sets - 10 reps - 5 hold - Seated March - 2 x daily - 7 x weekly - 2 sets - 10 reps - 5 hold - Seated Hip Abduction - 2 x daily - 7 x weekly - 2 sets - 10 reps - 5 hold - Seated Shoulder Shrugs - 2 x daily - 7 x weekly - 2 sets - 10 reps - 5 hold - Wheelchair Pressure Relief - 2 x daily - 7 x weekly - 2 sets - 10 reps - 5 hold  GOALS: Goals reviewed with patient? Yes  SHORT TERM GOALS: Target date: 02/22/2022  Pt will be independent with initial UE strengthening HEP in order to improve strength and balance in order to decrease fall risk and improve function at home and work. Baseline: 10/19/2021- patient with no  formal UE HEP; 12/02/2021= Patient verbalized knowledge of HEP including use of theraband for UE strengthening.  Goal status: GOAL MET  LONG TERM GOALS: Target date: 06/30/2022  Pt will be independent with final for UE/LE HEP in order to improve strength and balance in order to decrease fall risk and improve function at home and work. Baseline: Patient is currently BLE NWB and unable to participate in HEP. Has order for UE strengthening. 01/11/2022- Patient now able to participate in LE strengthening although NWB still- good understanding for some basic exercises. Will keep goal active to incorporate progressive LE strengthening exercises. 04/07/2022= Patient still participating in progressive LE seated HEP and  has no questions at this time.  Goal status: MET  2.  Pt will improve FOTO to target score of 35  to display perceived improvements in ability to complete ADL's.  Baseline: 10/19/2021= 12; 12/02/2021= 17; 01/11/2022=17; 04/07/2022= 17 Goal status: Ongoing  3.  Pt will increase strength of B UE  by at least 1/2 MMT grade in order to demonstrate improvement in strength and function  Baseline: patient has range of 2-/5 to 4/5 BUE Strength; 12/02/2021= 4/5 except with wrist ext. 01/11/2022= 4/5 B UE strength expept for wrist Goal status: Partially met  4. Pt. Will increase strength of RLE by at least 1/2 MMT grade in order to demonstrate improvement in Standing/transfers.  Baseline: 2-/5 Right hip flex/knee ext/flex; 04/07/2022= 2- with right hip flex/knee ext (lacking 28 deg from zero) 4/8= Patient able to ext right knee lacking 18 deg from zero.   Goal status: Progressing  5. Pt. Will demo ability to stand pivot transfer with max assist for improved functional mobility and less dependent need on mechanical device.   Baseline: Dependent on hoyer lift for all transfers; 04/07/2022- Unable to test secondary to patient with recent UTI and right hand procedure - will attempt to reassess next visit. ;  04/12/22: Unable to successfully stand due to feet plates on loaned power w/c; 05/10/2022- Patient still having to use rental chair and has not received her original power w/c back to practice SPT.   Goal status: ONGOING  6. Pt. Will demonstrate improved functional LE strength as seen by ability to stand > 2 min for improved transfer ability and pregait abilities.   Baseline: not assessed today due to recent dx: UTI; 04/07/2022- Unable to test secondary to patient with recent UTI and right hand procedure - will attempt to reassess next visit; 04/12/22: Unable to successfully stand due to feet plates on loaned power w/c4/09/2022- Patient still having to use rental chair and has not received her original power w/c back to practice Standing.   Goal status: ONGOING  ASSESSMENT:  CLINICAL IMPRESSION: Treatment focused on LE strengthening to assist with power/stamina required to stand. She was able to improve with less physical assist with right knee ext and able to increase resistance with left LE therex today.  Pt will continue to benefit from skilled PT services to optimize independence and reduced caregiver support.    OBJECTIVE IMPAIRMENTS Abnormal gait, decreased activity tolerance, decreased balance, decreased coordination, decreased endurance, decreased mobility, difficulty walking, decreased ROM, decreased strength, hypomobility, impaired flexibility, impaired UE functional use, postural dysfunction, and pain.   ACTIVITY LIMITATIONS carrying, lifting, bending, sitting, standing, squatting, sleeping, stairs, transfers, bed mobility, continence, bathing, toileting, dressing, self feeding, reach over head, hygiene/grooming, and caring for others  PARTICIPATION LIMITATIONS: meal prep, cleaning, laundry, medication management, personal finances, interpersonal relationship, driving, shopping, community activity, and yard work  PERSONAL FACTORS Age, Time since onset of injury/illness/exacerbation, and 1-2  comorbidities: HTN, cervical Sx  are also affecting patient's functional outcome.   REHAB POTENTIAL: Good  CLINICAL DECISION MAKING: Evolving/moderate complexity  EVALUATION COMPLEXITY: Moderate  PLAN: PT FREQUENCY: 1-2x/week  PT DURATION: 12 weeks  PLANNED INTERVENTIONS: Therapeutic exercises, Therapeutic activity, Neuromuscular re-education, Balance training, Gait training, Patient/Family education, Self Care, Joint mobilization, DME instructions, Dry Needling, Electrical stimulation, Wheelchair mobility training, Spinal mobilization, Cryotherapy, Moist heat, and Manual therapy  PLAN FOR NEXT SESSION: Continue with weight bearing activities, hoyer over to mat table and practice standing next visit and LE strengthening as appropriate.   Louis Meckel, PT  Physical Therapist- [facility]  [facility]  05/31/2022, 12:55 PM

## 2022-06-02 NOTE — Therapy (Signed)
Occupational Therapy Treatment Note      Patient Name: Sherry [name] MRN: [id number] DOB:[birth year], 88 y.o., female Today's Date: 06/02/2022                                         PCP: Dr. Tillman Abide REFERRING PROVIDER: Dr. Tillman Abide   OT End of Session - 06/02/22 1306     Visit Number [id number]    Number of Visits 72    Date for OT Re-Evaluation 06/30/22    Authorization Time Period Progress report period starting 12/14/2021    OT Start Time 1103    OT Stop Time 1145    OT Time Calculation (min) 42 min    Activity Tolerance Patient tolerated treatment well    Behavior During Therapy [facility] for tasks assessed/performed                  Past Medical History:  Diagnosis Date   Acute blood loss anemia    Arthritis    Cancer (HCC)    skin   Central cord syndrome at C6 level of cervical spinal cord (HCC) 11/29/2017   Hypertension    Protein-calorie malnutrition, severe (HCC) 01/24/2018   S/P insertion of IVC (inferior vena caval) filter 01/24/2018   Tetraparesis (HCC)    Past Surgical History:  Procedure Laterality Date   ANTERIOR CERVICAL DECOMP/DISCECTOMY FUSION N/A 11/29/2017   Procedure: Cervical five-six, six-seven Anterior Cervical Decompression Fusion;  Surgeon: Jadene Pierini, MD;  Location: [facility] OR;  Service: Neurosurgery;  Laterality: N/A;  Cervical five-six, six-seven Anterior Cervical Decompression Fusion   CATARACT EXTRACTION     FEMUR IM NAIL Left 08/16/2021   Procedure: INTRAMEDULLARY (IM) NAIL FEMORAL;  Surgeon: Deeann Saint, MD;  Location: [facility] ORS;  Service: Orthopedics;  Laterality: Left;   IR IVC FILTER PLMT / S&I /IMG GUID/MOD SED  12/08/2017   Patient Active Problem List   Diagnosis Date Noted   Hypertensive heart disease with other congestive heart failure (HCC) 02/14/2022   Chronic indwelling Foley catheter 02/14/2022   Closed hip fracture (HCC) 08/15/2021   DVT (deep venous thrombosis) (HCC) 08/15/2021   Quadriplegia (HCC) 08/15/2021    Fall 08/15/2021   Constipation due to slow transit 08/31/2018   Trauma 06/05/2018   Neuropathic pain 06/05/2018   Neurogenic bowel 06/05/2018   Vaginal yeast infection 01/30/2018   Healthcare-associated pneumonia 01/25/2018   Chronic allergic rhinitis 01/24/2018   Depression with anxiety 01/24/2018   UTI due to Klebsiella species 01/24/2018   Protein-calorie malnutrition, severe (HCC) 01/24/2018   S/P insertion of IVC (inferior vena caval) filter 01/24/2018   Tetraparesis (HCC) 01/20/2018   Neurogenic bladder 01/20/2018   Reactive depression    Benign essential HTN    Acute postoperative anemia due to expected blood loss    Central cord syndrome at C6 level of cervical spinal cord (HCC) 11/29/2017   Allergy to alpha-gal 11/25/2016   SCC (squamous cell carcinoma) 04/08/2014    REFERRING DIAG: Central Cord Syndrome at C6 of the Cervical Spinal Cord, Fall with Bilateral Closed Hip Fractures, with ORIF repair with Intramedullary Nailing of the Left Hip Fracture.  THERAPY DIAG:  Muscle weakness (generalized)  Rationale for Evaluation and Treatment Rehabilitation  PERTINENT HISTORY: Patient s/p fall November 29, 2017 resulting in diagnosis of central cord syndrome at C 6 level.  She has had therapy in multiple venues but with recent  move to [facility], therapy staff recommended she seek outpatient therapy for her needs. Pt. Had a recent fall with Bilateral Closed Hip Fractures, with ORIF repair with Intramedullary Nailing of the Left Hip Fracture.   PRECAUTIONS: None  SUBJECTIVE:   Pt. reports doing well today.  PAIN:  Are you having pain? No  OBJECTIVE:    Manual Therapy:   Pt. tolerated soft tissue massage to the volar, and dorsal surface of each digit on the bilateral hands, and thenar eminence secondary to stiffness following moist heat modality. Soft tissue mobilization was performed for Carpal, and metacarpal spread stretches. Manual therapy was performed independent of,  and in preparation for therapeutic Ex.   Therapeutic Ex.    Pt. tolerated PROM followed by AROM bilateral wrist extension, PROM for bilateral digit MP, PIP, and DIP flexion, and extension, thumb radial, and palmar abduction. Pt. worked on [employer], and reciprocal motion using the UBE while seated for 8 min. with no resistance. Constant monitoring was provided. Pt. worked on [employer] using a 2# dowel weight for bilateral shoulder flexion, chest press, and shoulder flexion.   PATIENT EDUCATION: RUE Distal ROM, and strength Person educated: Patient Education method: Explanation and Demonstration Education comprehension: verbalized understanding, returned demonstration, and needs further education   HOME EXERCISE PROGRAM Continue with ongoing HEPs  Measurements:  12/09/2021:  Shoulder flexion: R: 120(136), L: 138(150) Shoulder abduction: R: 108(120), L: 110(120) Elbow: R: 0-148, L: 0-146 Wrist flexion: R: 55(62), L: 78 Writ extension: R: 34(55), L: 3(4) RD: R: 12(20), L: 16(26) UD: R: 8(24), L: 6(18) Thumb radial abduction: R: 11(20), L: 8(20) Digit flexion to the [facility]: R:  2nd: 5cm(3.5cm), 3rd: 7.5 cm(5cm), 4th: 7cm(3cm), 5th: 6cm(5.5cm) L: 2nd: 8cm(8cm), 3rd: 8cm(8cm), 4th: 7.5cm(7cm), 5th: 7cm(7cm)  02/03/2022:   Shoulder flexion: R: 122(138), L: 148(154) Shoulder abduction: R: 109(125), L: 125(125 Elbow: R: 0-148, L: 0-146 Wrist flexion: R: 60(68), L: 79 Writ extension: R: 40(55), L: 3(10) RD: R: 14(24), L: 20(28) UD: R: 8(24), L: 6(18) Thumb radial abduction: R: 20(25), L: 8(20) Digit flexion to the [facility]: R:  2nd: 4.5cm(3cm), 3rd: 6.5cm(4.5cm), 4th: 6.5 cm(3cm), 5th: 4.5cm(5cm) L: 2nd: 8cm(8cm), 3rd: 8cm(7cm), 4th: 7.5cm(7cm), 5th: 6.5cm(6.5cm)   04/07/2022:   Shoulder flexion: R: 125(150), L: 160(160) Shoulder abduction: R: 109(125), L: 125(125 Elbow: R: 0-148, L: 0-146 Wrist flexion: R: 60(68), L: 79 Writ extension: R: 40(55), L: 5(18) RD: R:  14(24), L: 20(28) UD: R: 18(24), L: 8(18) Thumb radial abduction: R: 20(25), L: 8(20) Digit flexion to the [facility]: R:  2nd: 4 cm(4cm), 3rd: 6.5cm(4cm), 4th: 7 cm (5cm), 5th: 6cm(5cm) L: 2nd: 8cm(8cm), 3rd: 8cm(7cm), 4th: 7.5cm(7cm), 5th: 7cm(7cm)           OT Long Term Goals - 07/06/21 1106                                                                                          Target Date: 06/30/2022    OT LONG TERM GOAL #1   Title Patient and caregiver will demonstrate understanding of home exercise program for ROM.    Baseline 12/09/2021: Pt. Reports the restorative ROM program  has not resumed at the facility. 10/28/2021: Pt. Is attempting exercises/stretches at home. The facility restorative aides have not resumed exercises. 09/14/2021:  Pt. Reports inconsistencies with restorative ROM at  her care home. Pt/caregiver education to be provided about ongoing ROM needs.    Time 12    Period Weeks    Status Deferred     OT LONG TERM GOAL #2   Title Pt.  will improve BUE strength to be able to sustain her UE's in elevation to efficiently, and effectively perform hair care, and self-grooming tasks.   Baseline 02/03/2022: Pt. has improved, and is now able to sustain her Bilateral UEs in elevation long enough to efficiently, and effectively perform haircare, and self-grooming tasks. 12/09/2021: right shoulder 4-/5, left shoulder 4/5. Pt. has improved to be able to reach up for hair care, and self-grooming tasks. 10/28/2021: Right shoulder 3+/5, Left shoulder 4-/5 09/14/2021:  BUE shoulder strength: 3+/5 for shoulder flexion, and abduction.   Time 12    Period Weeks    Status Achieved               OT LONG TERM GOAL #3   Title Patient will demonstrate improved composite finger flexion to hold deodorant to apply to underarms.    Baseline 04/07/2022: Pt. Has improved with right 2nd, and 3rd digit flexion towards the [facility]. Pt. Continues to have difficulty securely holding and applying  deodorant.02/03/2022: Pt. Has improved with digit flexion, however continues to have difficulty securely holding and using deoderant. 12/09/2021: Bilateral hand/digit MP, PIP, and DIP extension tightness limits her ability achieve digit flexion to hold, and apply deodorant. 10/28/2021:  Pt. Continues to have difficulty holding the deodorant. 01/12/2021: Pt. Presents with limited digit extension. Pt. Is able to initiate holding deodorant, however is unable to hold it while using it.   Time 12    Period Weeks    Status Ongoing   Target Date                  OT LONG TERM GOAL #4   Title Pt. Will improve bilateral wrist extension in preparation for anticipating, and initiating reaching for objects at the table.    Baseline 04/07/2022: Rght: 40(55) Left: 5(18) 02/03/2022: Right: 34(55) Left: 3(4) 12/09/2021: Right  34(55)  Left  3(4) 10/28/2021:  Right 22(38), Left 0(15) 09/14/2021:  Right: 17(35), left 2(15)   Time 12   Period weeks   Status Ongoing   Target Date      OT LONG TERM GOAL #5   Title Pt. will write, and sign her name with 100% legibility, and modified independence.    Baseline 04/07/2022: Pt. Reports that she continues to present with shaky appearing writing. Pt.reports that she is writing more when filling out her menu, and complete puzzles 02/03/2022: Pt. Continues to report legibility appears "shaky" when attempting signing her name. Pt.reports that she is writing more when filling out her menu, and complete puzzles.12/09/2021: Pt. Reports legibility appears "shaky" when attempting signing her name. Pt.reports that she is writing more when filling out her menu. 10/28/2021: pt. is able to sign her name, however legibility continues to be inconsistent. Pt. Reports "shaky appearance". 09/14/2021: Pt. continues to fill out her daily menu and complete puzzles. Pt. Continues to present with limited writing legibility.   Time 12    Period Weeks    Status  Ongoing         OT LONG TERM GOAL  #6    TITLE Pt. will  turn pages in a book with modified independence.    Baseline 02/03/2022: Pt. Is able to effectively turn pages of a book. 10/28/2021: Pt. Is able to consistently turn book pages. 09/14/2021: Pt. has difficulty turning pages.    Time 12    Period Weeks    Status Achieved       OT LONG TERM GOAL  #7  TITLE Pt. Will increase  bilateral lateral pinch strength by 2 lbs to be able to securely grasp items during ADLs, and IADL tasks.   Baseline 04/07/2022: NT-Pinch meter out for calibration. 02/03/2022: Right: 6#, Left: 4# 12/09/2021:  Right: 6#, Left: 4#   Time 12  Period Weeks  Status Ongoing           OT LONG TERM GOAL  #8  TITLE Pt. Will button a sweater with large 1" buttons, and zip her jacket with modified independence.   Baseline 04/07/2022: Pt. Has difficulty manipulating buttons, however was introduced to a buttonhook. Pt. Was able to hold, and use a buttonhook for buttons on a shirt. 02/03/2022: Pt. is unable to manipulate buttons on her sweater, or zippers on her jacket.  Time 12  Period Weeks  Status Ongoing   OT LONG TERM GOAL  #9  TITLE Pt. will complete plant care with modified independence.  Baseline 04/07/2022: Pt. is now able to hold a cup and water her plants.02/03/2022: Pt. has difficulty caring for her plants.   Time 12  Period Weeks  Status Ongoing   OT LONG TERM GOAL  #10  TITLE Pt. will demonstrate adaptive techniques to assist with the efficiency of self-dressing, or morning care tasks.  Baseline 04/07/2022: Pt. Continues to require assist with the efficiency of self-dressing,a nd morning care tasks.02/03/2022: Pt. requires assist from caregivers 2/2 time limitations during morning care.   Time 12  Period Weeks  Status Ongoing     Plan - 07/22/21 1737     Clinical Impression Statement Pt. tolerated bilateral UE strengthening well. Pt. continues to tolerate UE strengthening with the 2# dowel well today. Pt. continues to tolerate distal bilateral wrist,  and digit ROM well. Pt. continues to present with tightness at the bilateral thumb webspace, and bilateral wrist flexor tightness, as well as digit MP, PIP, and DIP extensor tightness which continues to limit her ability to complete ADL tasks efficiently. Pt. continues to benefit from working on impoving ROM in order to work towards increasing bilateral hand grasp on objects, and increase engagement of bilateral hands during ADLs, and IADL tasks.                     OT Occupational Profile and History Detailed Assessment- Review of Records and additional review of physical, cognitive, psychosocial history related to current functional performance    Occupational performance deficits (Please refer to evaluation for details): ADL's;IADL's;Leisure;Social Participation    Body Structure / Function / Physical Skills ADL;Continence;Dexterity;Flexibility;Strength;ROM;Balance;Coordination;FMC;IADL;Endurance;UE functional use;Decreased knowledge of use of DME;[facility]    Psychosocial Skills Coping Strategies    Rehab Potential Fair    Clinical Decision Making Several treatment options, min-mod task modification necessary    Comorbidities Affecting Occupational Performance: Presence of comorbidities impacting occupational performance    Comorbidities impacting occupational performance description: contractures of bilateral hands, dependent transfers,    Modification or Assistance to Complete Evaluation  No modification of tasks or assist necessary to complete eval    OT Frequency 2x / week    OT Duration 12 weeks  OT Treatment/Interventions Self-care/ADL training;Cryotherapy;Therapeutic exercise;DME and/or AE instruction;Balance training;Neuromuscular education;Manual Therapy;Splinting;Moist Heat;Passive range of motion;Therapeutic activities;Patient/family education    Plan continue to progress ROM of digits, wrists and shoulders as it pertains to completion of ADL tasks that pt values.    Consulted and  Agree with Plan of Care Patient            Olegario Messier, MS, OTR/L  05/31/2022      `

## 2022-06-03 ENCOUNTER — Ambulatory Visit: Payer: Medicare HMO | Admitting: Occupational Therapy

## 2022-06-07 ENCOUNTER — Ambulatory Visit: Payer: Medicare HMO

## 2022-06-07 ENCOUNTER — Ambulatory Visit: Payer: Medicare HMO | Admitting: Occupational Therapy

## 2022-06-09 ENCOUNTER — Ambulatory Visit: Payer: Medicare HMO

## 2022-06-09 ENCOUNTER — Ambulatory Visit: Payer: Medicare HMO | Admitting: Occupational Therapy

## 2022-06-09 DIAGNOSIS — M6281 Muscle weakness (generalized): Secondary | ICD-10-CM

## 2022-06-09 DIAGNOSIS — S14129S Central cord syndrome at unspecified level of cervical spinal cord, sequela: Secondary | ICD-10-CM

## 2022-06-09 DIAGNOSIS — R262 Difficulty in walking, not elsewhere classified: Secondary | ICD-10-CM

## 2022-06-09 DIAGNOSIS — R269 Unspecified abnormalities of gait and mobility: Secondary | ICD-10-CM

## 2022-06-09 DIAGNOSIS — S72002A Fracture of unspecified part of neck of left femur, initial encounter for closed fracture: Secondary | ICD-10-CM

## 2022-06-09 DIAGNOSIS — R2689 Other abnormalities of gait and mobility: Secondary | ICD-10-CM

## 2022-06-09 DIAGNOSIS — R278 Other lack of coordination: Secondary | ICD-10-CM

## 2022-06-09 NOTE — Therapy (Addendum)
Occupational Therapy Treatment Note      Patient Name: Sherry [name] MRN: [id number] DOB:11/24/1934, 88 y.o., female Today's Date: 06/09/2022                                         PCP: Dr. Tillman Abide REFERRING PROVIDER: Dr. Tillman Abide   OT End of Session - 06/09/22 1407     Visit Number [id number]    Number of Visits 72    Date for OT Re-Evaluation 06/30/22    OT Start Time 1100    OT Stop Time 1145    OT Time Calculation (min) 45 min    Equipment Utilized During Treatment moist heat    Activity Tolerance Patient tolerated treatment well    Behavior During Therapy WFL for tasks assessed/performed                  Past Medical History:  Diagnosis Date   Acute blood loss anemia    Arthritis    Cancer (HCC)    skin   Central cord syndrome at C6 level of cervical spinal cord (HCC) 11/29/2017   Hypertension    Protein-calorie malnutrition, severe (HCC) 01/24/2018   S/P insertion of IVC (inferior vena caval) filter 01/24/2018   Tetraparesis (HCC)    Past Surgical History:  Procedure Laterality Date   ANTERIOR CERVICAL DECOMP/DISCECTOMY FUSION N/A 11/29/2017   Procedure: Cervical five-six, six-seven Anterior Cervical Decompression Fusion;  Surgeon: Jadene Pierini, MD;  Location: [facility] OR;  Service: Neurosurgery;  Laterality: N/A;  Cervical five-six, six-seven Anterior Cervical Decompression Fusion   CATARACT EXTRACTION     FEMUR IM NAIL Left 08/16/2021   Procedure: INTRAMEDULLARY (IM) NAIL FEMORAL;  Surgeon: Deeann Saint, MD;  Location: [facility] ORS;  Service: Orthopedics;  Laterality: Left;   IR IVC FILTER PLMT / S&I /IMG GUID/MOD SED  12/08/2017   Patient Active Problem List   Diagnosis Date Noted   Hypertensive heart disease with other congestive heart failure (HCC) 02/14/2022   Chronic indwelling Foley catheter 02/14/2022   Closed hip fracture (HCC) 08/15/2021   DVT (deep venous thrombosis) (HCC) 08/15/2021   Quadriplegia (HCC) 08/15/2021   Fall 08/15/2021    Constipation due to slow transit 08/31/2018   Trauma 06/05/2018   Neuropathic pain 06/05/2018   Neurogenic bowel 06/05/2018   Vaginal yeast infection 01/30/2018   Healthcare-associated pneumonia 01/25/2018   Chronic allergic rhinitis 01/24/2018   Depression with anxiety 01/24/2018   UTI due to Klebsiella species 01/24/2018   Protein-calorie malnutrition, severe (HCC) 01/24/2018   S/P insertion of IVC (inferior vena caval) filter 01/24/2018   Tetraparesis (HCC) 01/20/2018   Neurogenic bladder 01/20/2018   Reactive depression    Benign essential HTN    Acute postoperative anemia due to expected blood loss    Central cord syndrome at C6 level of cervical spinal cord (HCC) 11/29/2017   Allergy to alpha-gal 11/25/2016   SCC (squamous cell carcinoma) 04/08/2014    REFERRING DIAG: Central Cord Syndrome at C6 of the Cervical Spinal Cord, Fall with Bilateral Closed Hip Fractures, with ORIF repair with Intramedullary Nailing of the Left Hip Fracture.  THERAPY DIAG:  Muscle weakness (generalized)  Rationale for Evaluation and Treatment Rehabilitation  PERTINENT HISTORY: Patient s/p fall November 29, 2017 resulting in diagnosis of central cord syndrome at C 6 level.  She has had therapy in multiple venues but with recent move to  [facility], therapy staff recommended she seek outpatient therapy for her needs. Pt. Had a recent fall with Bilateral Closed Hip Fractures, with ORIF repair with Intramedullary Nailing of the Left Hip Fracture.   PRECAUTIONS: None  SUBJECTIVE:   Pt. reports that she has not been feeling well this week.  PAIN:  Are you having pain? No  OBJECTIVE:    Manual Therapy:   Pt. tolerated soft tissue massage to the volar, and dorsal surface of each digit on the bilateral hands, and thenar eminence secondary to stiffness following moist heat modality. Soft tissue mobilization was performed for Carpal, and metacarpal spread stretches. Manual therapy was performed  independent of, and in preparation for therapeutic Ex.   Therapeutic Ex.    Pt. tolerated PROM followed by AROM bilateral wrist extension, PROM for bilateral digit MP, PIP, and DIP flexion, and extension, thumb radial, and palmar abduction. Emphasis was placed on left wrist extension with moist heat modality.    PATIENT EDUCATION: RUE Distal ROM, and strength Person educated: Patient Education method: Explanation and Demonstration Education comprehension: verbalized understanding, returned demonstration, and needs further education   HOME EXERCISE PROGRAM Continue with ongoing HEPs  Measurements:  12/09/2021:  Shoulder flexion: R: 120(136), L: 138(150) Shoulder abduction: R: 108(120), L: 110(120) Elbow: R: 0-148, L: 0-146 Wrist flexion: R: 55(62), L: 78 Writ extension: R: 34(55), L: 3(4) RD: R: 12(20), L: 16(26) UD: R: 8(24), L: 6(18) Thumb radial abduction: R: 11(20), L: 8(20) Digit flexion to the [facility]: R:  2nd: 5cm(3.5cm), 3rd: 7.5 cm(5cm), 4th: 7cm(3cm), 5th: 6cm(5.5cm) L: 2nd: 8cm(8cm), 3rd: 8cm(8cm), 4th: 7.5cm(7cm), 5th: 7cm(7cm)  02/03/2022:   Shoulder flexion: R: 122(138), L: 148(154) Shoulder abduction: R: 109(125), L: 125(125 Elbow: R: 0-148, L: 0-146 Wrist flexion: R: 60(68), L: 79 Writ extension: R: 40(55), L: 3(10) RD: R: 14(24), L: 20(28) UD: R: 8(24), L: 6(18) Thumb radial abduction: R: 20(25), L: 8(20) Digit flexion to the [facility]: R:  2nd: 4.5cm(3cm), 3rd: 6.5cm(4.5cm), 4th: 6.5 cm(3cm), 5th: 4.5cm(5cm) L: 2nd: 8cm(8cm), 3rd: 8cm(7cm), 4th: 7.5cm(7cm), 5th: 6.5cm(6.5cm)   04/07/2022:   Shoulder flexion: R: 125(150), L: 160(160) Shoulder abduction: R: 109(125), L: 125(125 Elbow: R: 0-148, L: 0-146 Wrist flexion: R: 60(68), L: 79 Writ extension: R: 40(55), L: 5(18) RD: R: 14(24), L: 20(28) UD: R: 18(24), L: 8(18) Thumb radial abduction: R: 20(25), L: 8(20) Digit flexion to the [facility]: R:  2nd: 4 cm(4cm), 3rd: 6.5cm(4cm), 4th: 7 cm (5cm), 5th:  6cm(5cm) L: 2nd: 8cm(8cm), 3rd: 8cm(7cm), 4th: 7.5cm(7cm), 5th: 7cm(7cm)           OT Long Term Goals - 07/06/21 1106                                                                                          Target Date: 06/30/2022    OT LONG TERM GOAL #1   Title Patient and caregiver will demonstrate understanding of home exercise program for ROM.    Baseline 12/09/2021: Pt. Reports the restorative ROM program has not resumed at the facility. 10/28/2021: Pt. Is attempting exercises/stretches at home. The facility restorative aides have not resumed exercises. 09/14/2021:  Pt. Reports inconsistencies  with restorative ROM at  her care home. Pt/caregiver education to be provided about ongoing ROM needs.    Time 12    Period Weeks    Status Deferred     OT LONG TERM GOAL #2   Title Pt.  will improve BUE strength to be able to sustain her UE's in elevation to efficiently, and effectively perform hair care, and self-grooming tasks.   Baseline 02/03/2022: Pt. has improved, and is now able to sustain her Bilateral UEs in elevation long enough to efficiently, and effectively perform haircare, and self-grooming tasks. 12/09/2021: right shoulder 4-/5, left shoulder 4/5. Pt. has improved to be able to reach up for hair care, and self-grooming tasks. 10/28/2021: Right shoulder 3+/5, Left shoulder 4-/5 09/14/2021:  BUE shoulder strength: 3+/5 for shoulder flexion, and abduction.   Time 12    Period Weeks    Status Achieved               OT LONG TERM GOAL #3   Title Patient will demonstrate improved composite finger flexion to hold deodorant to apply to underarms.    Baseline 04/07/2022: Pt. Has improved with right 2nd, and 3rd digit flexion towards the [facility]. Pt. Continues to have difficulty securely holding and applying deodorant.02/03/2022: Pt. Has improved with digit flexion, however continues to have difficulty securely holding and using deoderant. 12/09/2021: Bilateral hand/digit MP, PIP, and DIP extension  tightness limits her ability achieve digit flexion to hold, and apply deodorant. 10/28/2021:  Pt. Continues to have difficulty holding the deodorant. 01/12/2021: Pt. Presents with limited digit extension. Pt. Is able to initiate holding deodorant, however is unable to hold it while using it.   Time 12    Period Weeks    Status Ongoing   Target Date                  OT LONG TERM GOAL #4   Title Pt. Will improve bilateral wrist extension in preparation for anticipating, and initiating reaching for objects at the table.    Baseline 04/07/2022: Rght: 40(55) Left: 5(18) 02/03/2022: Right: 34(55) Left: 3(4) 12/09/2021: Right  34(55)  Left  3(4) 10/28/2021:  Right 22(38), Left 0(15) 09/14/2021:  Right: 17(35), left 2(15)   Time 12   Period weeks   Status Ongoing   Target Date      OT LONG TERM GOAL #5   Title Pt. will write, and sign her name with 100% legibility, and modified independence.    Baseline 04/07/2022: Pt. Reports that she continues to present with shaky appearing writing. Pt.reports that she is writing more when filling out her menu, and complete puzzles 02/03/2022: Pt. Continues to report legibility appears "shaky" when attempting signing her name. Pt.reports that she is writing more when filling out her menu, and complete puzzles.12/09/2021: Pt. Reports legibility appears "shaky" when attempting signing her name. Pt.reports that she is writing more when filling out her menu. 10/28/2021: pt. is able to sign her name, however legibility continues to be inconsistent. Pt. Reports "shaky appearance". 09/14/2021: Pt. continues to fill out her daily menu and complete puzzles. Pt. Continues to present with limited writing legibility.   Time 12    Period Weeks    Status  Ongoing         OT LONG TERM GOAL  #6   TITLE Pt. will turn pages in a book with modified independence.    Baseline 02/03/2022: Pt. Is able to effectively turn pages of a book. 10/28/2021: Pt. Is able  to consistently turn book pages.  09/14/2021: Pt. has difficulty turning pages.    Time 12    Period Weeks    Status Achieved       OT LONG TERM GOAL  #7  TITLE Pt. Will increase  bilateral lateral pinch strength by 2 lbs to be able to securely grasp items during ADLs, and IADL tasks.   Baseline 04/07/2022: NT-Pinch meter out for calibration. 02/03/2022: Right: 6#, Left: 4# 12/09/2021:  Right: 6#, Left: 4#   Time 12  Period Weeks  Status Ongoing           OT LONG TERM GOAL  #8  TITLE Pt. Will button a sweater with large 1" buttons, and zip her jacket with modified independence.   Baseline 04/07/2022: Pt. Has difficulty manipulating buttons, however was introduced to a buttonhook. Pt. Was able to hold, and use a buttonhook for buttons on a shirt. 02/03/2022: Pt. is unable to manipulate buttons on her sweater, or zippers on her jacket.  Time 12  Period Weeks  Status Ongoing   OT LONG TERM GOAL  #9  TITLE Pt. will complete plant care with modified independence.  Baseline 04/07/2022: Pt. is now able to hold a cup and water her plants.02/03/2022: Pt. has difficulty caring for her plants.   Time 12  Period Weeks  Status Ongoing   OT LONG TERM GOAL  #10  TITLE Pt. will demonstrate adaptive techniques to assist with the efficiency of self-dressing, or morning care tasks.  Baseline 04/07/2022: Pt. Continues to require assist with the efficiency of self-dressing,a nd morning care tasks.02/03/2022: Pt. requires assist from caregivers 2/2 time limitations during morning care.   Time 12  Period Weeks  Status Ongoing     Plan - 07/22/21 1737     Clinical Impression Statement Pt. Reports  that she has not been feeling well lately, and having abdominal issues. Pt. continues to tolerate distal bilateral wrist, and digit ROM well. Pt. continues to present with tightness at the bilateral thumb webspace, and bilateral wrist flexor tightness, as well as digit MP, PIP, and DIP extensor tightness which continues to limit her ability to  complete ADL tasks efficiently. Pt. continues to benefit from working on impoving ROM in order to work towards increasing bilateral hand grasp on objects, and increase engagement of bilateral hands during ADLs, and IADL tasks.                     OT Occupational Profile and History Detailed Assessment- Review of Records and additional review of physical, cognitive, psychosocial history related to current functional performance    Occupational performance deficits (Please refer to evaluation for details): ADL's;IADL's;Leisure;Social Participation    Body Structure / Function / Physical Skills ADL;Continence;Dexterity;Flexibility;Strength;ROM;Balance;Coordination;FMC;IADL;Endurance;UE functional use;Decreased knowledge of use of DME;[facility]    Psychosocial Skills Coping Strategies    Rehab Potential Fair    Clinical Decision Making Several treatment options, min-mod task modification necessary    Comorbidities Affecting Occupational Performance: Presence of comorbidities impacting occupational performance    Comorbidities impacting occupational performance description: contractures of bilateral hands, dependent transfers,    Modification or Assistance to Complete Evaluation  No modification of tasks or assist necessary to complete eval    OT Frequency 2x / week    OT Duration 12 weeks    OT Treatment/Interventions Self-care/ADL training;Cryotherapy;Therapeutic exercise;DME and/or AE instruction;Balance training;Neuromuscular education;Manual Therapy;Splinting;Moist Heat;Passive range of motion;Therapeutic activities;Patient/family education    Plan continue to progress ROM of digits, wrists and  shoulders as it pertains to completion of ADL tasks that pt values.    Consulted and Agree with Plan of Care Patient            Olegario Messier, MS, OTR/L  06/09/2022 2:10 PM      `

## 2022-06-09 NOTE — Therapy (Signed)
OUTPATIENT PHYSICAL THERAPY NEURO TREATMENT   Patient Name: Sherry [name] MRN: [id number] DOB:1935-01-05, 88 y.o., female Today's Date: 06/09/2022   PCP: Sherry Mealing, MD REFERRING PROVIDER: Deeann [name]   PT End of Session - 06/09/22 1142     Visit Number 47    Number of Visits 58    Date for PT Re-Evaluation 06/30/22    Authorization Time Period Initial cert [id number]; Recert 01/11/2022-04/05/2022; Recert 04/07/2022-06/30/2022    Progress Note Due on Visit 50    PT Start Time 1146    PT Stop Time 1222    PT Time Calculation (min) 36 min    Equipment Utilized During Treatment Gait belt    Activity Tolerance Patient tolerated treatment well    Behavior During Therapy WFL for tasks assessed/performed                           Past Medical History:  Diagnosis Date   Acute blood loss anemia    Arthritis    Cancer (HCC)    skin   Central cord syndrome at C6 level of cervical spinal cord (HCC) 11/29/2017   Hypertension    Protein-calorie malnutrition, severe (HCC) 01/24/2018   S/P insertion of IVC (inferior vena caval) filter 01/24/2018   Tetraparesis (HCC)    Past Surgical History:  Procedure Laterality Date   ANTERIOR CERVICAL DECOMP/DISCECTOMY FUSION N/A 11/29/2017   Procedure: Cervical five-six, six-seven Anterior Cervical Decompression Fusion;  Surgeon: Sherry Pierini, MD;  Location: [facility] OR;  Service: Neurosurgery;  Laterality: N/A;  Cervical five-six, six-seven Anterior Cervical Decompression Fusion   CATARACT EXTRACTION     FEMUR IM NAIL Left 08/16/2021   Procedure: INTRAMEDULLARY (IM) NAIL FEMORAL;  Surgeon: Sherry Saint, MD;  Location: [facility] ORS;  Service: Orthopedics;  Laterality: Left;   IR IVC FILTER PLMT / S&I /IMG GUID/MOD SED  12/08/2017   Patient Active Problem List   Diagnosis Date Noted   Hypertensive heart disease with other congestive heart failure (HCC) 02/14/2022   Chronic indwelling Foley catheter 02/14/2022    Closed hip fracture (HCC) 08/15/2021   DVT (deep venous thrombosis) (HCC) 08/15/2021   Quadriplegia (HCC) 08/15/2021   Fall 08/15/2021   Constipation due to slow transit 08/31/2018   Trauma 06/05/2018   Neuropathic pain 06/05/2018   Neurogenic bowel 06/05/2018   Vaginal yeast infection 01/30/2018   Healthcare-associated pneumonia 01/25/2018   Chronic allergic rhinitis 01/24/2018   Depression with anxiety 01/24/2018   UTI due to Klebsiella species 01/24/2018   Protein-calorie malnutrition, severe (HCC) 01/24/2018   S/P insertion of IVC (inferior vena caval) filter 01/24/2018   Tetraparesis (HCC) 01/20/2018   Neurogenic bladder 01/20/2018   Reactive depression    Benign essential HTN    Acute postoperative anemia due to expected blood loss    Central cord syndrome at C6 level of cervical spinal cord (HCC) 11/29/2017   Allergy to alpha-gal 11/25/2016   SCC (squamous cell carcinoma) 04/08/2014    ONSET DATE: 08/15/2021 (fall with B hip fx); Initial injury was in 2019- quadraplegia due to central cord syndrome of C6 secondary to neck fracture.   REFERRING DIAG: Bilateral Hip fx; Left Open- s/p Left IM nail femoral on 08/16/2021; Right closed  THERAPY DIAG:  Muscle weakness (generalized)  Other lack of coordination  Central cord syndrome, sequela (HCC)  Abnormality of gait and mobility  Difficulty in walking, not elsewhere classified  Closed fracture of both hips, initial encounter (HCC)  Other abnormalities  of gait and mobility  Rationale for Evaluation and Treatment Rehabilitation  SUBJECTIVE:   SUBJECTIVE STATEMENT:    Patient reports not feeling well at all today- endorsing some stomach issues- states does not feel up to trying to stand but agreeable to seated exercises.   Pt accompanied by:  Paid caregiver-   PERTINENT HISTORY: Patient was previous patient (see previous episode for specifics) She experienced a fall at home on 08/15/2021 and was in Hospital from  08/15/2021- 08/20/2021.  Most recent history per Dr. Shonna [name] on 08/20/2021.  Sherry [name] is a 88 y.o. 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   Patient has history of quadriplegia, and has been doing PT/OT with improvement of function. Pt has some use of both arms and can feed herself. She can walk a little bit with assistance. Pt accidentally fell at about 930 this morning after taking shower.  She injured her hips. Hospital Course:    Closed left hip fracture [facility]) s/p mechanical fall Closed right hip fracture (?old) Acute blood loss anemia --pt is s/p  INTRAMEDULLARY (IM) NAIL FEMORAL-left on 7/16  PAIN:  Are you having pain? NO: NPRS scale: 0/10 Pain location: Right hip Pain description: Ache Aggravating factors: any active Movement of right side Relieving factors: Rest  PRECAUTIONS: Fall  WEIGHT BEARING RESTRICTIONS Yes Bilateral non-weight bearing B LE's  FALLS: Has patient fallen in last 6 months? Yes. Number of falls 1  LIVING ENVIRONMENT: Lives with: lives with their family and lives in a nursing home Lives in: Other [facility] term [facility] at [facility] Stairs: No Has following equipment at home: Wheelchair (power), Ramped entry, and hoyer lift  PLOF: Needs assistance with ADLs, Needs assistance with homemaking, Needs assistance with gait, Needs assistance with transfers, and Leisure: Work puzzles, read, Watch TV  PATIENT GOALS Patient goal is to walk; Be able to strengthen my arms for better use  OBJECTIVE:   DIAGNOSTIC FINDINGS: Overall cognitive statusEXAM: DG HIP (WITH OR WITHOUT PELVIS) 2-3V LEFT; DG HIP (WITH OR WITHOUT PELVIS) 2-3V RIGHT   COMPARISON:  CT of the abdomen and pelvis 01/21/2020   FINDINGS: Left proximal femur fracture  noted just below the intertrochanteric region. Marked ventral angulation noted on the cross-table image. Slight varus angulation present   Comminuted intratrochanteric fracture present on the right also with marked ventral angulation.   IMPRESSION: 1. Bilateral hip fractures. 2. Comminuted intertrochanteric fracture of the right hip. 3. Angulated fracture of the proximal left femur just below the intertrochanteric region.     Electronically Signed   By: Marin Roberts M.D.   On: 08/15/2021 11:54:   COGNITION:  Within functional limits for tasks assessed   SENSATION: Light touch: Impaired   EDEMA:  None  Strength R/L 2-/2- Shoulder flexion (anterior deltoid/pec major/coracobrachialis, axillary n. (C5-6) and musculocutaneous n. (C5-7)) 2-/3+ (within available ROM around 130 on lef)Shoulder abduction (deltoid/supraspinatus, axillary/suprascapular n, C5) 3+/3+ Shoulder external rotation (infraspinatus/teres minor) 4/4 Shoulder internal rotation (subcapularis/lats/pec major) 3+/3+ Shoulder extension (posterior deltoid, lats, teres major, axillary/thoracodorsal n.) 3+/3+ Shoulder horizontal abduction 4/4 Elbow flexion (biceps brachii, brachialis, brachioradialis, musculoskeletal n, C5-6) 4/4 Elbow extension (triceps, radial n, C7) 2-/2- Wrist Extension 3+/3+ Wrist Flexion   LOWER EXTREMITY MMT:    MMT Right 01/11/22 Left 01/11/22  Hip flexion 2- 2-  Hip extension 2- 2-  Hip abduction 2- 2-  Hip adduction 2- 3  Hip internal rotation 2- 2-  Hip external rotation 2- 2-  Knee flexion 2- 2-  Knee extension 2+ 3+  Ankle dorsiflexion 2- NT - boot  Ankle plantarflexion 2- NT- boot  Ankle inversion 2- NT- boot  Ankle eversion 2- NT- boot  (Blank rows = not tested)  BED MOBILITY:  Unable to test due to NWB status of BLE- Patient currently dependent with all bed mobility using hoyer and caregiver assist  TRANSFERS: Currently BLE NWB- Dependent on use of hoyer for  all transfers  FUNCTIONAL TESTs:   PATIENT SURVEYS:  FOTO 12  TODAY'S TREATMENT:  THERE.EX: AAROM- B hip flex 2 sets x 15 reps  AROM- LAQ- Left 2 sets x 15 and Right AAROM - 2sets x 15 reps Manual resistance hip add squeeze with ball - hold 3 sec x 10 x 2 sets Hip abd - AROM  on left and AAROM on Right 2 sets x 15 reps AAROM knee flex - hold 3 sec x 15 reps   B manual resistive leg press 1 set  x 15 reps    Pt educated throughout session about proper posture and technique with exercises. Improved exercise technique, movement at target joints, use of target muscles after min to mod verbal, visual, tactile cues.   PATIENT EDUCATION: Education details: PT plan of care/use of resistive bands Person educated: Patient Education method: Explanation, Demonstration, Tactile cues, and Verbal cues Education comprehension: verbalized understanding, returned demonstration, verbal cues required, tactile cues required, and needs further education   HOME EXERCISE PROGRAM: No changes at this time  Access Code: [facility] URL: [email or web address] Date: 11/23/2021 Prepared by: Precious Bard Exercises - Seated Gluteal Sets - 2 x daily - 7 x weekly - 2 sets - 10 reps - 5 hold - Seated Quad Set - 2 x daily - 7 x weekly - 2 sets - 10 reps - 5 hold - Seated Long Arc Quad - 2 x daily - 7 x weekly - 2 sets - 10 reps - 5 hold - Seated March - 2 x daily - 7 x weekly - 2 sets - 10 reps - 5 hold - Seated Hip Abduction - 2 x daily - 7 x weekly - 2 sets - 10 reps - 5 hold - Seated Shoulder Shrugs - 2 x daily - 7 x weekly - 2 sets - 10 reps - 5 hold - Wheelchair Pressure Relief - 2 x daily - 7 x weekly - 2 sets - 10 reps - 5 hold  GOALS: Goals reviewed with patient? Yes  SHORT TERM GOALS: Target date: 02/22/2022  Pt will be independent with initial UE strengthening HEP in order to improve strength and balance in order to decrease fall risk and improve function at home and work. Baseline: 10/19/2021-  patient with no formal UE HEP; 12/02/2021= Patient verbalized knowledge of HEP including use of theraband for UE strengthening.  Goal status: GOAL MET  LONG TERM GOALS: Target date: 06/30/2022  Pt will be independent with final for UE/LE HEP in order to improve strength and balance in order to decrease fall risk and improve function at home and work. Baseline: Patient is currently BLE NWB and unable to participate in HEP. Has order for UE strengthening. 01/11/2022- Patient now able to participate in LE strengthening although NWB still- good understanding for some basic exercises. Will keep goal active to incorporate progressive LE strengthening exercises. 04/07/2022= Patient  still participating in progressive LE seated HEP and has no questions at this time.  Goal status: MET  2.  Pt will improve FOTO to target score of 35  to display perceived improvements in ability to complete ADL's.  Baseline: 10/19/2021= 12; 12/02/2021= 17; 01/11/2022=17; 04/07/2022= 17 Goal status: Ongoing  3.  Pt will increase strength of B UE  by at least 1/2 MMT grade in order to demonstrate improvement in strength and function  Baseline: patient has range of 2-/5 to 4/5 BUE Strength; 12/02/2021= 4/5 except with wrist ext. 01/11/2022= 4/5 B UE strength expept for wrist Goal status: Partially met  4. Pt. Will increase strength of RLE by at least 1/2 MMT grade in order to demonstrate improvement in Standing/transfers.  Baseline: 2-/5 Right hip flex/knee ext/flex; 04/07/2022= 2- with right hip flex/knee ext (lacking 28 deg from zero) 4/8= Patient able to ext right knee lacking 18 deg from zero.   Goal status: Progressing  5. Pt. Will demo ability to stand pivot transfer with max assist for improved functional mobility and less dependent need on mechanical device.   Baseline: Dependent on hoyer lift for all transfers; 04/07/2022- Unable to test secondary to patient with recent UTI and right hand procedure - will attempt to reassess next  visit. ; 04/12/22: Unable to successfully stand due to feet plates on loaned power w/c; 05/10/2022- Patient still having to use rental chair and has not received her original power w/c back to practice SPT.   Goal status: ONGOING  6. Pt. Will demonstrate improved functional LE strength as seen by ability to stand > 2 min for improved transfer ability and pregait abilities.   Baseline: not assessed today due to recent dx: UTI; 04/07/2022- Unable to test secondary to patient with recent UTI and right hand procedure - will attempt to reassess next visit; 04/12/22: Unable to successfully stand due to feet plates on loaned power w/c4/09/2022- Patient still having to use rental chair and has not received her original power w/c back to practice Standing.   Goal status: ONGOING  ASSESSMENT:  CLINICAL IMPRESSION: Treatment limited to just seated LE strengthening secondary to patient not feeling well overall today. She was able to complete all reps without report of worsening- but visibily fatigued throughout session today. Unable to progress to standing activities today but will attempt again next visit. Pt will continue to benefit from skilled PT services to optimize independence and reduced caregiver support.    OBJECTIVE IMPAIRMENTS Abnormal gait, decreased activity tolerance, decreased balance, decreased coordination, decreased endurance, decreased mobility, difficulty walking, decreased ROM, decreased strength, hypomobility, impaired flexibility, impaired UE functional use, postural dysfunction, and pain.   ACTIVITY LIMITATIONS carrying, lifting, bending, sitting, standing, squatting, sleeping, stairs, transfers, bed mobility, continence, bathing, toileting, dressing, self feeding, reach over head, hygiene/grooming, and caring for others  PARTICIPATION LIMITATIONS: meal prep, cleaning, laundry, medication management, personal finances, interpersonal relationship, driving, shopping, community activity, and yard  work  PERSONAL FACTORS Age, Time since onset of injury/illness/exacerbation, and 1-2 comorbidities: HTN, cervical Sx  are also affecting patient's functional outcome.   REHAB POTENTIAL: Good  CLINICAL DECISION MAKING: Evolving/moderate complexity  EVALUATION COMPLEXITY: Moderate  PLAN: PT FREQUENCY: 1-2x/week  PT DURATION: 12 weeks  PLANNED INTERVENTIONS: Therapeutic exercises, Therapeutic activity, Neuromuscular re-education, Balance training, Gait training, Patient/Family education, Self Care, Joint mobilization, DME instructions, Dry Needling, Electrical stimulation, Wheelchair mobility training, Spinal mobilization, Cryotherapy, Moist heat, and Manual therapy  PLAN FOR NEXT SESSION: Continue with weight bearing activities, hoyer over to  mat table and practice standing next visit and LE strengthening as appropriate.   Louis Meckel, PT Physical Therapist- [facility]  06/09/2022, 2:59 PM

## 2022-06-10 ENCOUNTER — Ambulatory Visit: Payer: Medicare HMO | Admitting: Occupational Therapy

## 2022-06-14 ENCOUNTER — Ambulatory Visit: Payer: Medicare HMO

## 2022-06-14 ENCOUNTER — Ambulatory Visit: Payer: Medicare HMO | Admitting: Occupational Therapy

## 2022-06-16 ENCOUNTER — Ambulatory Visit: Payer: Medicare HMO

## 2022-06-16 ENCOUNTER — Ambulatory Visit: Payer: Medicare HMO | Admitting: Occupational Therapy

## 2022-06-16 DIAGNOSIS — R269 Unspecified abnormalities of gait and mobility: Secondary | ICD-10-CM

## 2022-06-16 DIAGNOSIS — M6281 Muscle weakness (generalized): Secondary | ICD-10-CM

## 2022-06-16 DIAGNOSIS — S14129S Central cord syndrome at unspecified level of cervical spinal cord, sequela: Secondary | ICD-10-CM

## 2022-06-16 DIAGNOSIS — R262 Difficulty in walking, not elsewhere classified: Secondary | ICD-10-CM

## 2022-06-16 DIAGNOSIS — R278 Other lack of coordination: Secondary | ICD-10-CM

## 2022-06-16 DIAGNOSIS — S72002A Fracture of unspecified part of neck of left femur, initial encounter for closed fracture: Secondary | ICD-10-CM

## 2022-06-16 NOTE — Therapy (Signed)
Occupational Therapy Treatment Note      Patient Name: Sherry [name] MRN: [id number] DOB:11-Feb-1934, 88 y.o., female Today's Date: 06/16/2022                                         PCP: Dr. Tillman Abide REFERRING PROVIDER: Dr. Tillman Abide   OT End of Session - 06/16/22 1510     Visit Number [id number]    Number of Visits 72    Date for OT Re-Evaluation 06/30/22    Authorization Time Period Progress report period starting 12/14/2021    OT Start Time 1100    OT Stop Time 1145    OT Time Calculation (min) 45 min    Activity Tolerance Patient tolerated treatment well    Behavior During Therapy [facility] for tasks assessed/performed                  Past Medical History:  Diagnosis Date   Acute blood loss anemia    Arthritis    Cancer (HCC)    skin   Central cord syndrome at C6 level of cervical spinal cord (HCC) 11/29/2017   Hypertension    Protein-calorie malnutrition, severe (HCC) 01/24/2018   S/P insertion of IVC (inferior vena caval) filter 01/24/2018   Tetraparesis (HCC)    Past Surgical History:  Procedure Laterality Date   ANTERIOR CERVICAL DECOMP/DISCECTOMY FUSION N/A 11/29/2017   Procedure: Cervical five-six, six-seven Anterior Cervical Decompression Fusion;  Surgeon: Jadene Pierini, MD;  Location: [facility] OR;  Service: Neurosurgery;  Laterality: N/A;  Cervical five-six, six-seven Anterior Cervical Decompression Fusion   CATARACT EXTRACTION     FEMUR IM NAIL Left 08/16/2021   Procedure: INTRAMEDULLARY (IM) NAIL FEMORAL;  Surgeon: Deeann Saint, MD;  Location: [facility] ORS;  Service: Orthopedics;  Laterality: Left;   IR IVC FILTER PLMT / S&I /IMG GUID/MOD SED  12/08/2017   Patient Active Problem List   Diagnosis Date Noted   Hypertensive heart disease with other congestive heart failure (HCC) 02/14/2022   Chronic indwelling Foley catheter 02/14/2022   Closed hip fracture (HCC) 08/15/2021   DVT (deep venous thrombosis) (HCC) 08/15/2021   Quadriplegia (HCC)  08/15/2021   Fall 08/15/2021   Constipation due to slow transit 08/31/2018   Trauma 06/05/2018   Neuropathic pain 06/05/2018   Neurogenic bowel 06/05/2018   Vaginal yeast infection 01/30/2018   Healthcare-associated pneumonia 01/25/2018   Chronic allergic rhinitis 01/24/2018   Depression with anxiety 01/24/2018   UTI due to Klebsiella species 01/24/2018   Protein-calorie malnutrition, severe (HCC) 01/24/2018   S/P insertion of IVC (inferior vena caval) filter 01/24/2018   Tetraparesis (HCC) 01/20/2018   Neurogenic bladder 01/20/2018   Reactive depression    Benign essential HTN    Acute postoperative anemia due to expected blood loss    Central cord syndrome at C6 level of cervical spinal cord (HCC) 11/29/2017   Allergy to alpha-gal 11/25/2016   SCC (squamous cell carcinoma) 04/08/2014    REFERRING DIAG: Central Cord Syndrome at C6 of the Cervical Spinal Cord, Fall with Bilateral Closed Hip Fractures, with ORIF repair with Intramedullary Nailing of the Left Hip Fracture.  THERAPY DIAG:  Muscle weakness (generalized)  Rationale for Evaluation and Treatment Rehabilitation  PERTINENT HISTORY: Patient s/p fall November 29, 2017 resulting in diagnosis of central cord syndrome at C 6 level.  She has had therapy in multiple venues but with recent  move to [facility], therapy staff recommended she seek outpatient therapy for her needs. Pt. Had a recent fall with Bilateral Closed Hip Fractures, with ORIF repair with Intramedullary Nailing of the Left Hip Fracture.   PRECAUTIONS: None  SUBJECTIVE:   Pt. reports that she has been feeling better this week.   PAIN:  Are you having pain? No  OBJECTIVE:    Manual Therapy:   Pt. tolerated soft tissue massage to the volar, and dorsal surface of each digit on the bilateral hands, and thenar eminence secondary to stiffness following moist heat modality. Soft tissue mobilization was performed for Carpal, and metacarpal spread stretches. Manual  therapy was performed independent of, and in preparation for therapeutic Ex.   Therapeutic Ex.    Pt. tolerated PROM followed by AROM bilateral wrist extension, PROM for bilateral digit MP, PIP, and DIP flexion, and extension, thumb radial, and palmar abduction. Emphasis was placed on left wrist extension with moist heat modality.    PATIENT EDUCATION: RUE Distal ROM, and strength Person educated: Patient Education method: Explanation and Demonstration Education comprehension: verbalized understanding, returned demonstration, and needs further education   HOME EXERCISE PROGRAM Continue with ongoing HEPs  Measurements:  12/09/2021:  Shoulder flexion: R: 120(136), L: 138(150) Shoulder abduction: R: 108(120), L: 110(120) Elbow: R: 0-148, L: 0-146 Wrist flexion: R: 55(62), L: 78 Writ extension: R: 34(55), L: 3(4) RD: R: 12(20), L: 16(26) UD: R: 8(24), L: 6(18) Thumb radial abduction: R: 11(20), L: 8(20) Digit flexion to the [facility]: R:  2nd: 5cm(3.5cm), 3rd: 7.5 cm(5cm), 4th: 7cm(3cm), 5th: 6cm(5.5cm) L: 2nd: 8cm(8cm), 3rd: 8cm(8cm), 4th: 7.5cm(7cm), 5th: 7cm(7cm)  02/03/2022:   Shoulder flexion: R: 122(138), L: 148(154) Shoulder abduction: R: 109(125), L: 125(125 Elbow: R: 0-148, L: 0-146 Wrist flexion: R: 60(68), L: 79 Writ extension: R: 40(55), L: 3(10) RD: R: 14(24), L: 20(28) UD: R: 8(24), L: 6(18) Thumb radial abduction: R: 20(25), L: 8(20) Digit flexion to the [facility]: R:  2nd: 4.5cm(3cm), 3rd: 6.5cm(4.5cm), 4th: 6.5 cm(3cm), 5th: 4.5cm(5cm) L: 2nd: 8cm(8cm), 3rd: 8cm(7cm), 4th: 7.5cm(7cm), 5th: 6.5cm(6.5cm)   04/07/2022:   Shoulder flexion: R: 125(150), L: 160(160) Shoulder abduction: R: 109(125), L: 125(125 Elbow: R: 0-148, L: 0-146 Wrist flexion: R: 60(68), L: 79 Writ extension: R: 40(55), L: 5(18) RD: R: 14(24), L: 20(28) UD: R: 18(24), L: 8(18) Thumb radial abduction: R: 20(25), L: 8(20) Digit flexion to the [facility]: R:  2nd: 4 cm(4cm), 3rd: 6.5cm(4cm), 4th: 7 cm  (5cm), 5th: 6cm(5cm) L: 2nd: 8cm(8cm), 3rd: 8cm(7cm), 4th: 7.5cm(7cm), 5th: 7cm(7cm)           OT Long Term Goals - 07/06/21 1106                                                                                          Target Date: 06/30/2022    OT LONG TERM GOAL #1   Title Patient and caregiver will demonstrate understanding of home exercise program for ROM.    Baseline 12/09/2021: Pt. Reports the restorative ROM program has not resumed at the facility. 10/28/2021: Pt. Is attempting exercises/stretches at home. The facility restorative aides have not resumed exercises. 09/14/2021:  Pt.  Reports inconsistencies with restorative ROM at  her care home. Pt/caregiver education to be provided about ongoing ROM needs.    Time 12    Period Weeks    Status Deferred     OT LONG TERM GOAL #2   Title Pt.  will improve BUE strength to be able to sustain her UE's in elevation to efficiently, and effectively perform hair care, and self-grooming tasks.   Baseline 02/03/2022: Pt. has improved, and is now able to sustain her Bilateral UEs in elevation long enough to efficiently, and effectively perform haircare, and self-grooming tasks. 12/09/2021: right shoulder 4-/5, left shoulder 4/5. Pt. has improved to be able to reach up for hair care, and self-grooming tasks. 10/28/2021: Right shoulder 3+/5, Left shoulder 4-/5 09/14/2021:  BUE shoulder strength: 3+/5 for shoulder flexion, and abduction.   Time 12    Period Weeks    Status Achieved               OT LONG TERM GOAL #3   Title Patient will demonstrate improved composite finger flexion to hold deodorant to apply to underarms.    Baseline 04/07/2022: Pt. Has improved with right 2nd, and 3rd digit flexion towards the [facility]. Pt. Continues to have difficulty securely holding and applying deodorant.02/03/2022: Pt. Has improved with digit flexion, however continues to have difficulty securely holding and using deoderant. 12/09/2021: Bilateral hand/digit MP, PIP, and  DIP extension tightness limits her ability achieve digit flexion to hold, and apply deodorant. 10/28/2021:  Pt. Continues to have difficulty holding the deodorant. 01/12/2021: Pt. Presents with limited digit extension. Pt. Is able to initiate holding deodorant, however is unable to hold it while using it.   Time 12    Period Weeks    Status Ongoing   Target Date                  OT LONG TERM GOAL #4   Title Pt. Will improve bilateral wrist extension in preparation for anticipating, and initiating reaching for objects at the table.    Baseline 04/07/2022: Rght: 40(55) Left: 5(18) 02/03/2022: Right: 34(55) Left: 3(4) 12/09/2021: Right  34(55)  Left  3(4) 10/28/2021:  Right 22(38), Left 0(15) 09/14/2021:  Right: 17(35), left 2(15)   Time 12   Period weeks   Status Ongoing   Target Date      OT LONG TERM GOAL #5   Title Pt. will write, and sign her name with 100% legibility, and modified independence.    Baseline 04/07/2022: Pt. Reports that she continues to present with shaky appearing writing. Pt.reports that she is writing more when filling out her menu, and complete puzzles 02/03/2022: Pt. Continues to report legibility appears "shaky" when attempting signing her name. Pt.reports that she is writing more when filling out her menu, and complete puzzles.12/09/2021: Pt. Reports legibility appears "shaky" when attempting signing her name. Pt.reports that she is writing more when filling out her menu. 10/28/2021: pt. is able to sign her name, however legibility continues to be inconsistent. Pt. Reports "shaky appearance". 09/14/2021: Pt. continues to fill out her daily menu and complete puzzles. Pt. Continues to present with limited writing legibility.   Time 12    Period Weeks    Status  Ongoing         OT LONG TERM GOAL  #6   TITLE Pt. will turn pages in a book with modified independence.    Baseline 02/03/2022: Pt. Is able to effectively turn pages of a book. 10/28/2021: Pt.  Is able to consistently  turn book pages. 09/14/2021: Pt. has difficulty turning pages.    Time 12    Period Weeks    Status Achieved       OT LONG TERM GOAL  #7  TITLE Pt. Will increase  bilateral lateral pinch strength by 2 lbs to be able to securely grasp items during ADLs, and IADL tasks.   Baseline 04/07/2022: NT-Pinch meter out for calibration. 02/03/2022: Right: 6#, Left: 4# 12/09/2021:  Right: 6#, Left: 4#   Time 12  Period Weeks  Status Ongoing           OT LONG TERM GOAL  #8  TITLE Pt. Will button a sweater with large 1" buttons, and zip her jacket with modified independence.   Baseline 04/07/2022: Pt. Has difficulty manipulating buttons, however was introduced to a buttonhook. Pt. Was able to hold, and use a buttonhook for buttons on a shirt. 02/03/2022: Pt. is unable to manipulate buttons on her sweater, or zippers on her jacket.  Time 12  Period Weeks  Status Ongoing   OT LONG TERM GOAL  #9  TITLE Pt. will complete plant care with modified independence.  Baseline 04/07/2022: Pt. is now able to hold a cup and water her plants.02/03/2022: Pt. has difficulty caring for her plants.   Time 12  Period Weeks  Status Ongoing   OT LONG TERM GOAL  #10  TITLE Pt. will demonstrate adaptive techniques to assist with the efficiency of self-dressing, or morning care tasks.  Baseline 04/07/2022: Pt. Continues to require assist with the efficiency of self-dressing,a nd morning care tasks.02/03/2022: Pt. requires assist from caregivers 2/2 time limitations during morning care.   Time 12  Period Weeks  Status Ongoing     Plan - 07/22/21 1737     Clinical Impression Statement Pt. reports feeling better today. Pt. continues to tolerate distal bilateral wrist, and digit ROM well. Pt. continues to present with tightness at the bilateral thumb webspace, and bilateral wrist flexor tightness, as well as digit MP, PIP, and DIP extensor tightness which continues to limit her ability to complete ADL tasks efficiently. Pt.  continues to benefit from working on impoving ROM in order to work towards increasing bilateral hand grasp on objects, and increase engagement of bilateral hands during ADLs, and IADL tasks.                     OT Occupational Profile and History Detailed Assessment- Review of Records and additional review of physical, cognitive, psychosocial history related to current functional performance    Occupational performance deficits (Please refer to evaluation for details): ADL's;IADL's;Leisure;Social Participation    Body Structure / Function / Physical Skills ADL;Continence;Dexterity;Flexibility;Strength;ROM;Balance;Coordination;FMC;IADL;Endurance;UE functional use;Decreased knowledge of use of DME;GMC    Psychosocial Skills Coping Strategies    Rehab Potential Fair    Clinical Decision Making Several treatment options, min-mod task modification necessary    Comorbidities Affecting Occupational Performance: Presence of comorbidities impacting occupational performance    Comorbidities impacting occupational performance description: contractures of bilateral hands, dependent transfers,    Modification or Assistance to Complete Evaluation  No modification of tasks or assist necessary to complete eval    OT Frequency 2x / week    OT Duration 12 weeks    OT Treatment/Interventions Self-care/ADL training;Cryotherapy;Therapeutic exercise;DME and/or AE instruction;Balance training;Neuromuscular education;Manual Therapy;Splinting;Moist Heat;Passive range of motion;Therapeutic activities;Patient/family education    Plan continue to progress ROM of digits, wrists and shoulders as it pertains to completion of ADL  tasks that pt values.    Consulted and Agree with Plan of Care Patient            Olegario Messier, MS, OTR/L  06/16/2022 2:10 PM      `

## 2022-06-16 NOTE — Therapy (Signed)
OUTPATIENT PHYSICAL THERAPY NEURO TREATMENT   Patient Name: Sherry [name] MRN: [id number] DOB:Dec 07, 1934, 88 y.o., female Today's Date: 06/16/2022   PCP: Sherry Mealing, MD REFERRING PROVIDER: Deeann [name]   PT End of Session - 06/16/22 1311     Visit Number 48    Number of Visits 58    Date for PT Re-Evaluation 06/30/22    Authorization Time Period Initial cert 05/10/8117-14/[id number]/2956; Recert 01/11/2022-04/05/2022; Recert 04/07/2022-06/30/2022    Progress Note Due on Visit 50    PT Start Time 1146    PT Stop Time 1227    PT Time Calculation (min) 41 min    Equipment Utilized During Treatment Gait belt    Activity Tolerance Patient tolerated treatment well    Behavior During Therapy WFL for tasks assessed/performed                            Past Medical History:  Diagnosis Date   Acute blood loss anemia    Arthritis    Cancer (HCC)    skin   Central cord syndrome at C6 level of cervical spinal cord (HCC) 11/29/2017   Hypertension    Protein-calorie malnutrition, severe (HCC) 01/24/2018   S/P insertion of IVC (inferior vena caval) filter 01/24/2018   Tetraparesis (HCC)    Past Surgical History:  Procedure Laterality Date   ANTERIOR CERVICAL DECOMP/DISCECTOMY FUSION N/A 11/29/2017   Procedure: Cervical five-six, six-seven Anterior Cervical Decompression Fusion;  Surgeon: Sherry Pierini, MD;  Location: [facility] OR;  Service: Neurosurgery;  Laterality: N/A;  Cervical five-six, six-seven Anterior Cervical Decompression Fusion   CATARACT EXTRACTION     FEMUR IM NAIL Left 08/16/2021   Procedure: INTRAMEDULLARY (IM) NAIL FEMORAL;  Surgeon: Sherry Saint, MD;  Location: [facility] ORS;  Service: Orthopedics;  Laterality: Left;   IR IVC FILTER PLMT / S&I /IMG GUID/MOD SED  12/08/2017   Patient Active Problem List   Diagnosis Date Noted   Hypertensive heart disease with other congestive heart failure (HCC) 02/14/2022   Chronic indwelling Foley catheter 02/14/2022    Closed hip fracture (HCC) 08/15/2021   DVT (deep venous thrombosis) (HCC) 08/15/2021   Quadriplegia (HCC) 08/15/2021   Fall 08/15/2021   Constipation due to slow transit 08/31/2018   Trauma 06/05/2018   Neuropathic pain 06/05/2018   Neurogenic bowel 06/05/2018   Vaginal yeast infection 01/30/2018   Healthcare-associated pneumonia 01/25/2018   Chronic allergic rhinitis 01/24/2018   Depression with anxiety 01/24/2018   UTI due to Klebsiella species 01/24/2018   Protein-calorie malnutrition, severe (HCC) 01/24/2018   S/P insertion of IVC (inferior vena caval) filter 01/24/2018   Tetraparesis (HCC) 01/20/2018   Neurogenic bladder 01/20/2018   Reactive depression    Benign essential HTN    Acute postoperative anemia due to expected blood loss    Central cord syndrome at C6 level of cervical spinal cord (HCC) 11/29/2017   Allergy to alpha-gal 11/25/2016   SCC (squamous cell carcinoma) 04/08/2014    ONSET DATE: 08/15/2021 (fall with B hip fx); Initial injury was in 2019- quadraplegia due to central cord syndrome of C6 secondary to neck fracture.   REFERRING DIAG: Bilateral Hip fx; Left Open- s/p Left IM nail femoral on 08/16/2021; Right closed  THERAPY DIAG:  Muscle weakness (generalized)  Other lack of coordination  Central cord syndrome, sequela (HCC)  Abnormality of gait and mobility  Difficulty in walking, not elsewhere classified  Closed fracture of both hips, initial encounter (HCC)  Rationale  for Evaluation and Treatment Rehabilitation  SUBJECTIVE:   SUBJECTIVE STATEMENT:    Patient reports having a rough Monday but feeling okay today. Reports feeling okay enough to try to stand.    Pt accompanied by:  Paid caregiver-   PERTINENT HISTORY: Patient was previous patient (see previous episode for specifics) She experienced a fall at home on 08/15/2021 and was in Hospital from 08/15/2021- 08/20/2021.  Most recent history per Dr. Shonna [name] on 08/20/2021.  Sherry A  [name] is a 88 y.o. 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   Patient has history of quadriplegia, and has been doing PT/OT with improvement of function. Pt has some use of both arms and can feed herself. She can walk a little bit with assistance. Pt accidentally fell at about 930 this morning after taking shower.  She injured her hips. Hospital Course:    Closed left hip fracture [name]) s/p mechanical fall Closed right hip fracture (?old) Acute blood loss anemia --pt is s/p  INTRAMEDULLARY (IM) NAIL FEMORAL-left on 7/16  PAIN:  Are you having pain? NO: NPRS scale: 0/10 Pain location: Right hip Pain description: Ache Aggravating factors: any active Movement of right side Relieving factors: Rest  PRECAUTIONS: Fall  WEIGHT BEARING RESTRICTIONS Yes Bilateral non-weight bearing B LE's  FALLS: Has patient fallen in last 6 months? Yes. Number of falls 1  LIVING ENVIRONMENT: Lives with: lives with their family and lives in a nursing home Lives in: Other Long term care at [name] Stairs: No Has following equipment at home: Wheelchair (power), Ramped entry, and hoyer lift  PLOF: Needs assistance with ADLs, Needs assistance with homemaking, Needs assistance with gait, Needs assistance with transfers, and Leisure: Work puzzles, read, Watch TV  PATIENT GOALS Patient goal is to walk; Be able to strengthen my arms for better use  OBJECTIVE:   DIAGNOSTIC FINDINGS: Overall cognitive statusEXAM: DG HIP (WITH OR WITHOUT PELVIS) 2-3V LEFT; DG HIP (WITH OR WITHOUT PELVIS) 2-3V RIGHT   COMPARISON:  CT of the abdomen and pelvis 01/21/2020   FINDINGS: Left proximal femur fracture noted just below the intertrochanteric region. Marked ventral angulation noted on the  cross-table image. Slight varus angulation present   Comminuted intratrochanteric fracture present on the right also with marked ventral angulation.   IMPRESSION: 1. Bilateral hip fractures. 2. Comminuted intertrochanteric fracture of the right hip. 3. Angulated fracture of the proximal left femur just below the intertrochanteric region.     Electronically Signed   By: Marin Roberts M.D.   On: 08/15/2021 11:54:   COGNITION:  Within functional limits for tasks assessed   SENSATION: Light touch: Impaired   EDEMA:  None  Strength R/L 2-/2- Shoulder flexion (anterior deltoid/pec major/coracobrachialis, axillary n. (C5-6) and musculocutaneous n. (C5-7)) 2-/3+ (within available ROM around 130 on lef)Shoulder abduction (deltoid/supraspinatus, axillary/suprascapular n, C5) 3+/3+ Shoulder external rotation (infraspinatus/teres minor) 4/4 Shoulder internal rotation (subcapularis/lats/pec major) 3+/3+ Shoulder extension (posterior deltoid, lats, teres major, axillary/thoracodorsal n.) 3+/3+ Shoulder horizontal abduction 4/4 Elbow flexion (biceps brachii, brachialis, brachioradialis, musculoskeletal n, C5-6) 4/4 Elbow extension (triceps, radial n, C7) 2-/2- Wrist Extension 3+/3+ Wrist Flexion   LOWER EXTREMITY MMT:    MMT Right 01/11/22 Left 01/11/22  Hip flexion 2- 2-  Hip extension 2- 2-  Hip abduction 2- 2-  Hip adduction 2-  3  Hip internal rotation 2- 2-  Hip external rotation 2- 2-  Knee flexion 2- 2-  Knee extension 2+ 3+  Ankle dorsiflexion 2- NT - boot  Ankle plantarflexion 2- NT- boot  Ankle inversion 2- NT- boot  Ankle eversion 2- NT- boot  (Blank rows = not tested)  BED MOBILITY:  Unable to test due to NWB status of BLE- Patient currently dependent with all bed mobility using hoyer and caregiver assist  TRANSFERS: Currently BLE NWB- Dependent on use of hoyer for all transfers  FUNCTIONAL TESTs:   PATIENT SURVEYS:  FOTO 12  TODAY'S  TREATMENT:  THERE.EX: AAROM- B hip flex 2 sets x 15 reps  AROM- LAQ- Left 2 sets x 15 and Right AAROM - 2sets x 15 reps Hip abd - AROM  on left and AAROM on Right 2 sets x 15 reps AAROM knee flex  x 15 reps each LE  THERAPEUTIC ACTIVITIES:    Sit to stand from seat of power w/c x 4- Max assist of 1 with patient reaching for // bars once up- Difficulty with trunk/hip extension and PT blocking right    Knee today.   Static stand in // bars- 4 trials ranging from initially 20 sec due to loss balance when adjusting arms on // bars then 39 sec, 1 min and up to 1 min 34 sec today on 4th trial.     Pt educated throughout session about proper posture and technique with exercises. Improved exercise technique, movement at target joints, use of target muscles after min to mod verbal, visual, tactile cues.   PATIENT EDUCATION: Education details: PT plan of care/use of resistive bands Person educated: Patient Education method: Explanation, Demonstration, Tactile cues, and Verbal cues Education comprehension: verbalized understanding, returned demonstration, verbal cues required, tactile cues required, and needs further education   HOME EXERCISE PROGRAM: No changes at this time  Access Code: [facility] URL: [email or web address] Date: 11/23/2021 Prepared by: Precious Bard Exercises - Seated Gluteal Sets - 2 x daily - 7 x weekly - 2 sets - 10 reps - 5 hold - Seated Quad Set - 2 x daily - 7 x weekly - 2 sets - 10 reps - 5 hold - Seated Long Arc Quad - 2 x daily - 7 x weekly - 2 sets - 10 reps - 5 hold - Seated March - 2 x daily - 7 x weekly - 2 sets - 10 reps - 5 hold - Seated Hip Abduction - 2 x daily - 7 x weekly - 2 sets - 10 reps - 5 hold - Seated Shoulder Shrugs - 2 x daily - 7 x weekly - 2 sets - 10 reps - 5 hold - Wheelchair Pressure Relief - 2 x daily - 7 x weekly - 2 sets - 10 reps - 5 hold  GOALS: Goals reviewed with patient? Yes  SHORT TERM GOALS: Target date: 02/22/2022  Pt  will be independent with initial UE strengthening HEP in order to improve strength and balance in order to decrease fall risk and improve function at home and work. Baseline: 10/19/2021- patient with no formal UE HEP; 12/02/2021= Patient verbalized knowledge of HEP including use of theraband for UE strengthening.  Goal status: GOAL MET  LONG TERM GOALS: Target date: 06/30/2022  Pt will be independent with final for UE/LE HEP in order to improve strength and balance in order to decrease fall risk and improve function at home and work. Baseline: Patient is currently BLE NWB and unable  to participate in HEP. Has order for UE strengthening. 01/11/2022- Patient now able to participate in LE strengthening although NWB still- good understanding for some basic exercises. Will keep goal active to incorporate progressive LE strengthening exercises. 04/07/2022= Patient still participating in progressive LE seated HEP and has no questions at this time.  Goal status: MET  2.  Pt will improve FOTO to target score of 35  to display perceived improvements in ability to complete ADL's.  Baseline: 10/19/2021= 12; 12/02/2021= 17; 01/11/2022=17; 04/07/2022= 17 Goal status: Ongoing  3.  Pt will increase strength of B UE  by at least 1/2 MMT grade in order to demonstrate improvement in strength and function  Baseline: patient has range of 2-/5 to 4/5 BUE Strength; 12/02/2021= 4/5 except with wrist ext. 01/11/2022= 4/5 B UE strength expept for wrist Goal status: Partially met  4. Pt. Will increase strength of RLE by at least 1/2 MMT grade in order to demonstrate improvement in Standing/transfers.  Baseline: 2-/5 Right hip flex/knee ext/flex; 04/07/2022= 2- with right hip flex/knee ext (lacking 28 deg from zero) 4/8= Patient able to ext right knee lacking 18 deg from zero.   Goal status: Progressing  5. Pt. Will demo ability to stand pivot transfer with max assist for improved functional mobility and less dependent need on  mechanical device.   Baseline: Dependent on hoyer lift for all transfers; 04/07/2022- Unable to test secondary to patient with recent UTI and right hand procedure - will attempt to reassess next visit. ; 04/12/22: Unable to successfully stand due to feet plates on loaned power w/c; 05/10/2022- Patient still having to use rental chair and has not received her original power w/c back to practice SPT.   Goal status: ONGOING  6. Pt. Will demonstrate improved functional LE strength as seen by ability to stand > 2 min for improved transfer ability and pregait abilities.   Baseline: not assessed today due to recent dx: UTI; 04/07/2022- Unable to test secondary to patient with recent UTI and right hand procedure - will attempt to reassess next visit; 04/12/22: Unable to successfully stand due to feet plates on loaned power w/c4/09/2022- Patient still having to use rental chair and has not received her original power w/c back to practice Standing.   Goal status: ONGOING  ASSESSMENT:  CLINICAL IMPRESSION:  Patient presents with good motivation. She was able to stand with improved overall times and improved trunk ext with trials. She continues to be most limited by overall fatigue- no buckling of knees today- just more difficulty extending trunk to stand erect. She required some assist with seated strengthening after standing due to overall fatigue/weakness. Pt will continue to benefit from skilled PT services to optimize independence and reduced caregiver support.    OBJECTIVE IMPAIRMENTS Abnormal gait, decreased activity tolerance, decreased balance, decreased coordination, decreased endurance, decreased mobility, difficulty walking, decreased ROM, decreased strength, hypomobility, impaired flexibility, impaired UE functional use, postural dysfunction, and pain.   ACTIVITY LIMITATIONS carrying, lifting, bending, sitting, standing, squatting, sleeping, stairs, transfers, bed mobility, continence, bathing, toileting,  dressing, self feeding, reach over head, hygiene/grooming, and caring for others  PARTICIPATION LIMITATIONS: meal prep, cleaning, laundry, medication management, personal finances, interpersonal relationship, driving, shopping, community activity, and yard work  PERSONAL FACTORS Age, Time since onset of injury/illness/exacerbation, and 1-2 comorbidities: HTN, cervical Sx  are also affecting patient's functional outcome.   REHAB POTENTIAL: Good  CLINICAL DECISION MAKING: Evolving/moderate complexity  EVALUATION COMPLEXITY: Moderate  PLAN: PT FREQUENCY: 1-2x/week  PT DURATION: 12  weeks  PLANNED INTERVENTIONS: Therapeutic exercises, Therapeutic activity, Neuromuscular re-education, Balance training, Gait training, Patient/Family education, Self Care, Joint mobilization, DME instructions, Dry Needling, Electrical stimulation, Wheelchair mobility training, Spinal mobilization, Cryotherapy, Moist heat, and Manual therapy  PLAN FOR NEXT SESSION: Continue with weight bearing activities, hoyer over to mat table and practice standing next visit and LE strengthening as appropriate.   Louis Meckel, PT Physical Therapist- [facility]  06/16/2022, 1:27 PM

## 2022-06-17 ENCOUNTER — Ambulatory Visit: Payer: Medicare HMO | Admitting: Occupational Therapy

## 2022-06-21 ENCOUNTER — Ambulatory Visit: Payer: Medicare HMO | Admitting: Occupational Therapy

## 2022-06-21 ENCOUNTER — Ambulatory Visit: Payer: Medicare HMO

## 2022-06-23 ENCOUNTER — Ambulatory Visit: Payer: Medicare HMO | Admitting: Occupational Therapy

## 2022-06-23 ENCOUNTER — Ambulatory Visit: Payer: Medicare HMO

## 2022-06-23 DIAGNOSIS — R2689 Other abnormalities of gait and mobility: Secondary | ICD-10-CM

## 2022-06-23 DIAGNOSIS — S14129S Central cord syndrome at unspecified level of cervical spinal cord, sequela: Secondary | ICD-10-CM

## 2022-06-23 DIAGNOSIS — R278 Other lack of coordination: Secondary | ICD-10-CM

## 2022-06-23 DIAGNOSIS — S72001A Fracture of unspecified part of neck of right femur, initial encounter for closed fracture: Secondary | ICD-10-CM

## 2022-06-23 DIAGNOSIS — M6281 Muscle weakness (generalized): Secondary | ICD-10-CM

## 2022-06-23 DIAGNOSIS — R269 Unspecified abnormalities of gait and mobility: Secondary | ICD-10-CM

## 2022-06-23 DIAGNOSIS — R262 Difficulty in walking, not elsewhere classified: Secondary | ICD-10-CM

## 2022-06-23 NOTE — Therapy (Signed)
Occupational Therapy Treatment Note      Patient Name: DESHON HOVSEPIAN MRN: [id number] DOB:08-Feb-1934, 88 y.o., female Today's Date: 06/23/2022                                         PCP: Dr. Tillman Abide REFERRING PROVIDER: Dr. Tillman Abide   OT End of Session - 06/23/22 1246     Visit Number 55    Number of Visits 72    Date for OT Re-Evaluation 06/30/22    OT Start Time 1100    OT Stop Time 1145    OT Time Calculation (min) 45 min    Activity Tolerance Patient tolerated treatment well    Behavior During Therapy [facility] for tasks assessed/performed                  Past Medical History:  Diagnosis Date   Acute blood loss anemia    Arthritis    Cancer (HCC)    skin   Central cord syndrome at C6 level of cervical spinal cord (HCC) 11/29/2017   Hypertension    Protein-calorie malnutrition, severe (HCC) 01/24/2018   S/P insertion of IVC (inferior vena caval) filter 01/24/2018   Tetraparesis (HCC)    Past Surgical History:  Procedure Laterality Date   ANTERIOR CERVICAL DECOMP/DISCECTOMY FUSION N/A 11/29/2017   Procedure: Cervical five-six, six-seven Anterior Cervical Decompression Fusion;  Surgeon: Jadene Pierini, MD;  Location: [facility] OR;  Service: Neurosurgery;  Laterality: N/A;  Cervical five-six, six-seven Anterior Cervical Decompression Fusion   CATARACT EXTRACTION     FEMUR IM NAIL Left 08/16/2021   Procedure: INTRAMEDULLARY (IM) NAIL FEMORAL;  Surgeon: Deeann Saint, MD;  Location: [facility] ORS;  Service: Orthopedics;  Laterality: Left;   IR IVC FILTER PLMT / S&I /IMG GUID/MOD SED  12/08/2017   Patient Active Problem List   Diagnosis Date Noted   Hypertensive heart disease with other congestive heart failure (HCC) 02/14/2022   Chronic indwelling Foley catheter 02/14/2022   Closed hip fracture (HCC) 08/15/2021   DVT (deep venous thrombosis) (HCC) 08/15/2021   Quadriplegia (HCC) 08/15/2021   Fall 08/15/2021   Constipation due to slow transit 08/31/2018    Trauma 06/05/2018   Neuropathic pain 06/05/2018   Neurogenic bowel 06/05/2018   Vaginal yeast infection 01/30/2018   Healthcare-associated pneumonia 01/25/2018   Chronic allergic rhinitis 01/24/2018   Depression with anxiety 01/24/2018   UTI due to Klebsiella species 01/24/2018   Protein-calorie malnutrition, severe (HCC) 01/24/2018   S/P insertion of IVC (inferior vena caval) filter 01/24/2018   Tetraparesis (HCC) 01/20/2018   Neurogenic bladder 01/20/2018   Reactive depression    Benign essential HTN    Acute postoperative anemia due to expected blood loss    Central cord syndrome at C6 level of cervical spinal cord (HCC) 11/29/2017   Allergy to alpha-gal 11/25/2016   SCC (squamous cell carcinoma) 04/08/2014    REFERRING DIAG: Central Cord Syndrome at C6 of the Cervical Spinal Cord, Fall with Bilateral Closed Hip Fractures, with ORIF repair with Intramedullary Nailing of the Left Hip Fracture.  THERAPY DIAG:  Muscle weakness (generalized)  Rationale for Evaluation and Treatment Rehabilitation  PERTINENT HISTORY: Patient s/p fall November 29, 2017 resulting in diagnosis of central cord syndrome at C 6 level.  She has had therapy in multiple venues but with recent move to [facility], therapy staff recommended she seek outpatient therapy  for her needs. Pt. Had a recent fall with Bilateral Closed Hip Fractures, with ORIF repair with Intramedullary Nailing of the Left Hip Fracture.   PRECAUTIONS: None  SUBJECTIVE:   Pt. reports  that she was unable to find anyone to accompany her to therapy at the last minute on Monday.  PAIN:  Are you having pain? No  OBJECTIVE:    Manual Therapy:   Pt. tolerated soft tissue massage to the volar, and dorsal surface of each digit on the bilateral hands, and thenar eminence secondary to stiffness following moist heat modality. Soft tissue mobilization was performed for Carpal, and metacarpal spread stretches. Manual therapy was performed  independent of, and in preparation for therapeutic Ex.   Therapeutic Ex.    Pt. tolerated PROM followed by AROM bilateral wrist extension, PROM for bilateral digit MP, PIP, and DIP flexion, and extension, thumb radial, and palmar abduction. Emphasis was placed on left wrist extension with moist heat modality.    PATIENT EDUCATION: RUE Distal ROM, and strength Person educated: Patient Education method: Explanation and Demonstration Education comprehension: verbalized understanding, returned demonstration, and needs further education   HOME EXERCISE PROGRAM Continue with ongoing HEPs  Measurements:  12/09/2021:  Shoulder flexion: R: 120(136), L: 138(150) Shoulder abduction: R: 108(120), L: 110(120) Elbow: R: 0-148, L: 0-146 Wrist flexion: R: 55(62), L: 78 Writ extension: R: 34(55), L: 3(4) RD: R: 12(20), L: 16(26) UD: R: 8(24), L: 6(18) Thumb radial abduction: R: 11(20), L: 8(20) Digit flexion to the [facility]: R:  2nd: 5cm(3.5cm), 3rd: 7.5 cm(5cm), 4th: 7cm(3cm), 5th: 6cm(5.5cm) L: 2nd: 8cm(8cm), 3rd: 8cm(8cm), 4th: 7.5cm(7cm), 5th: 7cm(7cm)  02/03/2022:   Shoulder flexion: R: 122(138), L: 148(154) Shoulder abduction: R: 109(125), L: 125(125 Elbow: R: 0-148, L: 0-146 Wrist flexion: R: 60(68), L: 79 Writ extension: R: 40(55), L: 3(10) RD: R: 14(24), L: 20(28) UD: R: 8(24), L: 6(18) Thumb radial abduction: R: 20(25), L: 8(20) Digit flexion to the [facility]: R:  2nd: 4.5cm(3cm), 3rd: 6.5cm(4.5cm), 4th: 6.5 cm(3cm), 5th: 4.5cm(5cm) L: 2nd: 8cm(8cm), 3rd: 8cm(7cm), 4th: 7.5cm(7cm), 5th: 6.5cm(6.5cm)   04/07/2022:   Shoulder flexion: R: 125(150), L: 160(160) Shoulder abduction: R: 109(125), L: 125(125 Elbow: R: 0-148, L: 0-146 Wrist flexion: R: 60(68), L: 79 Writ extension: R: 40(55), L: 5(18) RD: R: 14(24), L: 20(28) UD: R: 18(24), L: 8(18) Thumb radial abduction: R: 20(25), L: 8(20) Digit flexion to the [facility]: R:  2nd: 4 cm(4cm), 3rd: 6.5cm(4cm), 4th: 7 cm (5cm), 5th:  6cm(5cm) L: 2nd: 8cm(8cm), 3rd: 8cm(7cm), 4th: 7.5cm(7cm), 5th: 7cm(7cm)           OT Long Term Goals - 07/06/21 1106                                                                                          Target Date: 06/30/2022    OT LONG TERM GOAL #1   Title Patient and caregiver will demonstrate understanding of home exercise program for ROM.    Baseline 12/09/2021: Pt. Reports the restorative ROM program has not resumed at the facility. 10/28/2021: Pt. Is attempting exercises/stretches at home. The facility restorative aides have not resumed exercises. 09/14/2021:  Pt. Reports  inconsistencies with restorative ROM at  her care home. Pt/caregiver education to be provided about ongoing ROM needs.    Time 12    Period Weeks    Status Deferred     OT LONG TERM GOAL #2   Title Pt.  will improve BUE strength to be able to sustain her UE's in elevation to efficiently, and effectively perform hair care, and self-grooming tasks.   Baseline 02/03/2022: Pt. has improved, and is now able to sustain her Bilateral UEs in elevation long enough to efficiently, and effectively perform haircare, and self-grooming tasks. 12/09/2021: right shoulder 4-/5, left shoulder 4/5. Pt. has improved to be able to reach up for hair care, and self-grooming tasks. 10/28/2021: Right shoulder 3+/5, Left shoulder 4-/5 09/14/2021:  BUE shoulder strength: 3+/5 for shoulder flexion, and abduction.   Time 12    Period Weeks    Status Achieved               OT LONG TERM GOAL #3   Title Patient will demonstrate improved composite finger flexion to hold deodorant to apply to underarms.    Baseline 04/07/2022: Pt. Has improved with right 2nd, and 3rd digit flexion towards the [facility]. Pt. Continues to have difficulty securely holding and applying deodorant.02/03/2022: Pt. Has improved with digit flexion, however continues to have difficulty securely holding and using deoderant. 12/09/2021: Bilateral hand/digit MP, PIP, and DIP extension  tightness limits her ability achieve digit flexion to hold, and apply deodorant. 10/28/2021:  Pt. Continues to have difficulty holding the deodorant. 01/12/2021: Pt. Presents with limited digit extension. Pt. Is able to initiate holding deodorant, however is unable to hold it while using it.   Time 12    Period Weeks    Status Ongoing   Target Date                  OT LONG TERM GOAL #4   Title Pt. Will improve bilateral wrist extension in preparation for anticipating, and initiating reaching for objects at the table.    Baseline 04/07/2022: Rght: 40(55) Left: 5(18) 02/03/2022: Right: 34(55) Left: 3(4) 12/09/2021: Right  34(55)  Left  3(4) 10/28/2021:  Right 22(38), Left 0(15) 09/14/2021:  Right: 17(35), left 2(15)   Time 12   Period weeks   Status Ongoing   Target Date      OT LONG TERM GOAL #5   Title Pt. will write, and sign her name with 100% legibility, and modified independence.    Baseline 04/07/2022: Pt. Reports that she continues to present with shaky appearing writing. Pt.reports that she is writing more when filling out her menu, and complete puzzles 02/03/2022: Pt. Continues to report legibility appears "shaky" when attempting signing her name. Pt.reports that she is writing more when filling out her menu, and complete puzzles.12/09/2021: Pt. Reports legibility appears "shaky" when attempting signing her name. Pt.reports that she is writing more when filling out her menu. 10/28/2021: pt. is able to sign her name, however legibility continues to be inconsistent. Pt. Reports "shaky appearance". 09/14/2021: Pt. continues to fill out her daily menu and complete puzzles. Pt. Continues to present with limited writing legibility.   Time 12    Period Weeks    Status  Ongoing         OT LONG TERM GOAL  #6   TITLE Pt. will turn pages in a book with modified independence.    Baseline 02/03/2022: Pt. Is able to effectively turn pages of a book. 10/28/2021: Pt. Is  able to consistently turn book pages.  09/14/2021: Pt. has difficulty turning pages.    Time 12    Period Weeks    Status Achieved       OT LONG TERM GOAL  #7  TITLE Pt. Will increase  bilateral lateral pinch strength by 2 lbs to be able to securely grasp items during ADLs, and IADL tasks.   Baseline 04/07/2022: NT-Pinch meter out for calibration. 02/03/2022: Right: 6#, Left: 4# 12/09/2021:  Right: 6#, Left: 4#   Time 12  Period Weeks  Status Ongoing           OT LONG TERM GOAL  #8  TITLE Pt. Will button a sweater with large 1" buttons, and zip her jacket with modified independence.   Baseline 04/07/2022: Pt. Has difficulty manipulating buttons, however was introduced to a buttonhook. Pt. Was able to hold, and use a buttonhook for buttons on a shirt. 02/03/2022: Pt. is unable to manipulate buttons on her sweater, or zippers on her jacket.  Time 12  Period Weeks  Status Ongoing   OT LONG TERM GOAL  #9  TITLE Pt. will complete plant care with modified independence.  Baseline 04/07/2022: Pt. is now able to hold a cup and water her plants.02/03/2022: Pt. has difficulty caring for her plants.   Time 12  Period Weeks  Status Ongoing   OT LONG TERM GOAL  #10  TITLE Pt. will demonstrate adaptive techniques to assist with the efficiency of self-dressing, or morning care tasks.  Baseline 04/07/2022: Pt. Continues to require assist with the efficiency of self-dressing,a nd morning care tasks.02/03/2022: Pt. requires assist from caregivers 2/2 time limitations during morning care.   Time 12  Period Weeks  Status Ongoing     Plan - 07/22/21 1737     Clinical Impression Statement Pt. Reports that she will be out next week due to having an appointment to receive her new power w/c, as it has arrived earlier than anticipated.  Pt. Reports  that she watered her flowers using a cup.  Pt. continues to present with tightness at the bilateral thumb webspace, and bilateral wrist flexor tightness, as well as digit MP, PIP, and DIP extensor  tightness which continues to limit her ability to complete ADL tasks efficiently. Pt. continues to benefit from working on impoving ROM in order to work towards increasing bilateral hand grasp on objects, and increase engagement of bilateral hands during ADLs, and IADL tasks.                     OT Occupational Profile and History Detailed Assessment- Review of Records and additional review of physical, cognitive, psychosocial history related to current functional performance    Occupational performance deficits (Please refer to evaluation for details): ADL's;IADL's;Leisure;Social Participation    Body Structure / Function / Physical Skills ADL;Continence;Dexterity;Flexibility;Strength;ROM;Balance;Coordination;FMC;IADL;Endurance;UE functional use;Decreased knowledge of use of DME;[facility]    Psychosocial Skills Coping Strategies    Rehab Potential Fair    Clinical Decision Making Several treatment options, min-mod task modification necessary    Comorbidities Affecting Occupational Performance: Presence of comorbidities impacting occupational performance    Comorbidities impacting occupational performance description: contractures of bilateral hands, dependent transfers,    Modification or Assistance to Complete Evaluation  No modification of tasks or assist necessary to complete eval    OT Frequency 2x / week    OT Duration 12 weeks    OT Treatment/Interventions Self-care/ADL training;Cryotherapy;Therapeutic exercise;DME and/or AE instruction;Balance training;Neuromuscular education;Manual Therapy;Splinting;Moist Heat;Passive range of  motion;Therapeutic activities;Patient/family education    Plan continue to progress ROM of digits, wrists and shoulders as it pertains to completion of ADL tasks that pt values.    Consulted and Agree with Plan of Care Patient            Olegario Messier, MS, OTR/L  06/23/2022 11:19 PM      `

## 2022-06-23 NOTE — Therapy (Signed)
OUTPATIENT PHYSICAL THERAPY NEURO TREATMENT   Patient Name: Sherry [name] MRN: [id number] DOB:[birth year], 88 y.o., female Today's Date: 06/23/2022   PCP: Sherry Mealing, MD REFERRING PROVIDER: Deeann [name]   PT End of Session - 06/23/22 1140     Visit Number 49    Number of Visits 58    Date for PT Re-Evaluation 06/30/22    Authorization Time Period Initial cert 05/10/8117-14/[id number]/2956; Recert 01/11/2022-04/05/2022; Recert 04/07/2022-06/30/2022    Progress Note Due on Visit 50    PT Start Time 1145    PT Stop Time 1215    PT Time Calculation (min) 30 min    Equipment Utilized During Treatment Gait belt    Activity Tolerance Patient tolerated treatment well    Behavior During Therapy WFL for tasks assessed/performed                            Past Medical History:  Diagnosis Date   Acute blood loss anemia    Arthritis    Cancer (HCC)    skin   Central cord syndrome at C6 level of cervical spinal cord (HCC) 11/29/2017   Hypertension    Protein-calorie malnutrition, severe (HCC) 01/24/2018   S/P insertion of IVC (inferior vena caval) filter 01/24/2018   Tetraparesis (HCC)    Past Surgical History:  Procedure Laterality Date   ANTERIOR CERVICAL DECOMP/DISCECTOMY FUSION N/A 11/29/2017   Procedure: Cervical five-six, six-seven Anterior Cervical Decompression Fusion;  Surgeon: Sherry Pierini, MD;  Location: [facility] OR;  Service: Neurosurgery;  Laterality: N/A;  Cervical five-six, six-seven Anterior Cervical Decompression Fusion   CATARACT EXTRACTION     FEMUR IM NAIL Left 08/16/2021   Procedure: INTRAMEDULLARY (IM) NAIL FEMORAL;  Surgeon: Sherry Saint, MD;  Location: [facility] ORS;  Service: Orthopedics;  Laterality: Left;   IR IVC FILTER PLMT / S&I /IMG GUID/MOD SED  12/08/2017   Patient Active Problem List   Diagnosis Date Noted   Hypertensive heart disease with other congestive heart failure (HCC) 02/14/2022   Chronic indwelling Foley catheter 02/14/2022    Closed hip fracture (HCC) 08/15/2021   DVT (deep venous thrombosis) (HCC) 08/15/2021   Quadriplegia (HCC) 08/15/2021   Fall 08/15/2021   Constipation due to slow transit 08/31/2018   Trauma 06/05/2018   Neuropathic pain 06/05/2018   Neurogenic bowel 06/05/2018   Vaginal yeast infection 01/30/2018   Healthcare-associated pneumonia 01/25/2018   Chronic allergic rhinitis 01/24/2018   Depression with anxiety 01/24/2018   UTI due to Klebsiella species 01/24/2018   Protein-calorie malnutrition, severe (HCC) 01/24/2018   S/P insertion of IVC (inferior vena caval) filter 01/24/2018   Tetraparesis (HCC) 01/20/2018   Neurogenic bladder 01/20/2018   Reactive depression    Benign essential HTN    Acute postoperative anemia due to expected blood loss    Central cord syndrome at C6 level of cervical spinal cord (HCC) 11/29/2017   Allergy to alpha-gal 11/25/2016   SCC (squamous cell carcinoma) 04/08/2014    ONSET DATE: 08/15/2021 (fall with B hip fx); Initial injury was in 2019- quadraplegia due to central cord syndrome of C6 secondary to neck fracture.   REFERRING DIAG: Bilateral Hip fx; Left Open- s/p Left IM nail femoral on 08/16/2021; Right closed  THERAPY DIAG:  Muscle weakness (generalized)  Other lack of coordination  Central cord syndrome, sequela (HCC)  Abnormality of gait and mobility  Difficulty in walking, not elsewhere classified  Closed fracture of both hips, initial encounter (HCC)  Other  abnormalities of gait and mobility  Rationale for Evaluation and Treatment Rehabilitation  SUBJECTIVE:   SUBJECTIVE STATEMENT:    Patient reports feeling okay- not great but not too bad.     Pt accompanied by:  Sherry [name]-   PERTINENT HISTORY: Patient was previous patient (see previous episode for specifics) She experienced a fall at home on 08/15/2021 and was in Hospital from 08/15/2021- 08/20/2021.  Most recent history per Dr. Shonna [name] on 08/20/2021.  Sherry [name] is  a 88 y.o. 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   Patient has history of quadriplegia, and has been doing PT/OT with improvement of function. Pt has some use of both arms and can feed herself. She can walk a little bit with assistance. Pt accidentally fell at about 930 this morning after taking shower.  She injured her hips. Hospital Course:    Closed left hip fracture [facility]) s/p mechanical fall Closed right hip fracture (?old) Acute blood loss anemia --pt is s/p  INTRAMEDULLARY (IM) NAIL FEMORAL-left on 7/16  PAIN:  Are you having pain? NO: NPRS scale: 0/10 Pain location: Right hip Pain description: Ache Aggravating factors: any active Movement of right side Relieving factors: Rest  PRECAUTIONS: Fall  WEIGHT BEARING RESTRICTIONS Yes Bilateral non-weight bearing B LE's  FALLS: Has patient fallen in last 6 months? Yes. Number of falls 1  LIVING ENVIRONMENT: Lives with: lives with their family and lives in a nursing home Lives in: Other Long term [facility] at [facility] Stairs: No Has following equipment at home: Wheelchair (power), Ramped entry, and hoyer lift  PLOF: Needs assistance with ADLs, Needs assistance with homemaking, Needs assistance with gait, Needs assistance with transfers, and Leisure: Work puzzles, read, Watch TV  PATIENT GOALS Patient goal is to walk; Be able to strengthen my arms for better use  OBJECTIVE:   DIAGNOSTIC FINDINGS: Overall cognitive statusEXAM: DG HIP (WITH OR WITHOUT PELVIS) 2-3V LEFT; DG HIP (WITH OR WITHOUT PELVIS) 2-3V RIGHT   COMPARISON:  CT of the abdomen and pelvis 01/21/2020   FINDINGS: Left proximal femur fracture noted just below the intertrochanteric region. Marked ventral angulation noted on the cross-table  image. Slight varus angulation present   Comminuted intratrochanteric fracture present on the right also with marked ventral angulation.   IMPRESSION: 1. Bilateral hip fractures. 2. Comminuted intertrochanteric fracture of the right hip. 3. Angulated fracture of the proximal left femur just below the intertrochanteric region.     Electronically Signed   By: Marin Roberts M.D.   On: 08/15/2021 11:54:   COGNITION:  Within functional limits for tasks assessed   SENSATION: Light touch: Impaired   EDEMA:  None  Strength R/L 2-/2- Shoulder flexion (anterior deltoid/pec major/coracobrachialis, axillary n. (C5-6) and musculocutaneous n. (C5-7)) 2-/3+ (within available ROM around 130 on lef)Shoulder abduction (deltoid/supraspinatus, axillary/suprascapular n, C5) 3+/3+ Shoulder external rotation (infraspinatus/teres minor) 4/4 Shoulder internal rotation (subcapularis/lats/pec major) 3+/3+ Shoulder extension (posterior deltoid, lats, teres major, axillary/thoracodorsal n.) 3+/3+ Shoulder horizontal abduction 4/4 Elbow flexion (biceps brachii, brachialis, brachioradialis, musculoskeletal n, C5-6) 4/4 Elbow extension (triceps, radial n, C7) 2-/2- Wrist Extension 3+/3+ Wrist Flexion   LOWER EXTREMITY MMT:    MMT Right 01/11/22 Left 01/11/22  Hip flexion 2- 2-  Hip extension 2- 2-  Hip abduction 2- 2-  Hip adduction 2-  3  Hip internal rotation 2- 2-  Hip external rotation 2- 2-  Knee flexion 2- 2-  Knee extension 2+ 3+  Ankle dorsiflexion 2- NT - boot  Ankle plantarflexion 2- NT- boot  Ankle inversion 2- NT- boot  Ankle eversion 2- NT- boot  (Blank rows = not tested)  BED MOBILITY:  Unable to test due to NWB status of BLE- Patient currently dependent with all bed mobility using hoyer and [name] assist  TRANSFERS: Currently BLE NWB- Dependent on use of hoyer for all transfers  FUNCTIONAL TESTs:   PATIENT SURVEYS:  FOTO 12  TODAY'S  TREATMENT:  THERAPEUTIC ACTIVITIES:  Sit to stand from seat of power w/c x 3- Max assist of 1-2 with patient reaching for // bars once up- Difficulty with trunk/hip extension and PT blocking right    Knee today.   Static stand in // bars- 3 trials ranging from initially 30 sec with increased left knee genu valgus (yet no pain), 1 min, and lastly 30 sec - stopped due to undo fatigue.   *observed some cloudy colored urine in catheter and mentioned to patient- reviewed signs and symptoms of UTI and patient stated she would follow up with her MD.     Pt educated throughout session about proper posture and technique with exercises. Improved exercise technique, movement at target joints, use of target muscles after min to mod verbal, visual, tactile cues.   PATIENT EDUCATION: Education details: PT plan of care/use of resistive bands Person educated: Patient Education method: Explanation, Demonstration, Tactile cues, and Verbal cues Education comprehension: verbalized understanding, returned demonstration, verbal cues required, tactile cues required, and needs further education   HOME EXERCISE PROGRAM: No changes at this time  Access Code: [facility] URL: [email or web address] Date: 11/23/2021 Prepared by: Precious Bard Exercises - Seated Gluteal Sets - 2 x daily - 7 x weekly - 2 sets - 10 reps - 5 hold - Seated Quad Set - 2 x daily - 7 x weekly - 2 sets - 10 reps - 5 hold - Seated Long Arc Quad - 2 x daily - 7 x weekly - 2 sets - 10 reps - 5 hold - Seated March - 2 x daily - 7 x weekly - 2 sets - 10 reps - 5 hold - Seated Hip Abduction - 2 x daily - 7 x weekly - 2 sets - 10 reps - 5 hold - Seated Shoulder Shrugs - 2 x daily - 7 x weekly - 2 sets - 10 reps - 5 hold - Wheelchair Pressure Relief - 2 x daily - 7 x weekly - 2 sets - 10 reps - 5 hold  GOALS: Goals reviewed with patient? Yes  SHORT TERM GOALS: Target date: 02/22/2022  Pt will be independent with initial UE strengthening HEP  in order to improve strength and balance in order to decrease fall risk and improve function at home and work. Baseline: 10/19/2021- patient with no formal UE HEP; 12/02/2021= Patient verbalized knowledge of HEP including use of theraband for UE strengthening.  Goal status: GOAL MET  LONG TERM GOALS: Target date: 06/30/2022  Pt will be independent with final for UE/LE HEP in order to improve strength and balance in order to decrease fall risk and improve function at home and work. Baseline: Patient is currently BLE NWB and unable to participate in HEP. Has order for UE strengthening. 01/11/2022- Patient now able to participate in LE strengthening although NWB still- good understanding for some basic exercises. Will keep goal  active to incorporate progressive LE strengthening exercises. 04/07/2022= Patient still participating in progressive LE seated HEP and has no questions at this time.  Goal status: MET  2.  Pt will improve FOTO to target score of 35  to display perceived improvements in ability to complete ADL's.  Baseline: 10/19/2021= 12; 12/02/2021= 17; 01/11/2022=17; 04/07/2022= 17 Goal status: Ongoing  3.  Pt will increase strength of B UE  by at least 1/2 MMT grade in order to demonstrate improvement in strength and function  Baseline: patient has range of 2-/5 to 4/5 BUE Strength; 12/02/2021= 4/5 except with wrist ext. 01/11/2022= 4/5 B UE strength expept for wrist Goal status: Partially met  4. Pt. Will increase strength of RLE by at least 1/2 MMT grade in order to demonstrate improvement in Standing/transfers.  Baseline: 2-/5 Right hip flex/knee ext/flex; 04/07/2022= 2- with right hip flex/knee ext (lacking 28 deg from zero) 4/8= Patient able to ext right knee lacking 18 deg from zero.   Goal status: Progressing  5. Pt. Will demo ability to stand pivot transfer with max assist for improved functional mobility and less dependent need on mechanical device.   Baseline: Dependent on hoyer lift for  all transfers; 04/07/2022- Unable to test secondary to patient with recent UTI and right hand procedure - will attempt to reassess next visit. ; 04/12/22: Unable to successfully stand due to feet plates on loaned power w/c; 05/10/2022- Patient still having to use rental chair and has not received her original power w/c back to practice SPT.   Goal status: ONGOING  6. Pt. Will demonstrate improved functional LE strength as seen by ability to stand > 2 min for improved transfer ability and pregait abilities.   Baseline: not assessed today due to recent dx: UTI; 04/07/2022- Unable to test secondary to patient with recent UTI and right hand procedure - will attempt to reassess next visit; 04/12/22: Unable to successfully stand due to feet plates on loaned power w/c4/09/2022- Patient still having to use rental chair and has not received her original power w/c back to practice Standing.   Goal status: ONGOING  ASSESSMENT:  CLINICAL IMPRESSION:  Patient was limited today - fatigued very quickly with standing and transfers today. She also exhibited increased overall knee valgus left knee today and required more physical assist to stand versus past sessions. Pt will continue to benefit from skilled PT services to optimize independence and reduced [name] support.    OBJECTIVE IMPAIRMENTS Abnormal gait, decreased activity tolerance, decreased balance, decreased coordination, decreased endurance, decreased mobility, difficulty walking, decreased ROM, decreased strength, hypomobility, impaired flexibility, impaired UE functional use, postural dysfunction, and pain.   ACTIVITY LIMITATIONS carrying, lifting, bending, sitting, standing, squatting, sleeping, stairs, transfers, bed mobility, continence, bathing, toileting, dressing, self feeding, reach over head, hygiene/grooming, and caring for others  PARTICIPATION LIMITATIONS: meal prep, cleaning, laundry, medication management, personal finances, interpersonal  relationship, driving, shopping, community activity, and yard work  PERSONAL FACTORS Age, Time since onset of injury/illness/exacerbation, and 1-2 comorbidities: HTN, cervical Sx  are also affecting patient's functional outcome.   REHAB POTENTIAL: Good  CLINICAL DECISION MAKING: Evolving/moderate complexity  EVALUATION COMPLEXITY: Moderate  PLAN: PT FREQUENCY: 1-2x/week  PT DURATION: 12 weeks  PLANNED INTERVENTIONS: Therapeutic exercises, Therapeutic activity, Neuromuscular re-education, Balance training, Gait training, Patient/Family education, Self Care, Joint mobilization, DME instructions, Dry Needling, Electrical stimulation, Wheelchair mobility training, Spinal mobilization, Cryotherapy, Moist heat, and Manual therapy  PLAN FOR NEXT SESSION: Continue with weight bearing activities, hoyer over to mat table  and practice standing next visit and LE strengthening as appropriate.   Louis Meckel, PT Physical Therapist- [facility]  06/23/2022, 1:24 PM

## 2022-06-24 ENCOUNTER — Ambulatory Visit: Payer: Medicare HMO | Admitting: Occupational Therapy

## 2022-06-30 ENCOUNTER — Ambulatory Visit: Payer: Medicare HMO

## 2022-06-30 ENCOUNTER — Ambulatory Visit: Payer: Medicare HMO | Admitting: Occupational Therapy

## 2022-07-01 ENCOUNTER — Ambulatory Visit: Payer: Medicare HMO | Admitting: Occupational Therapy

## 2022-07-05 ENCOUNTER — Ambulatory Visit: Payer: Medicare HMO

## 2022-07-05 ENCOUNTER — Ambulatory Visit: Payer: Medicare HMO | Attending: Internal Medicine | Admitting: Occupational Therapy

## 2022-07-05 DIAGNOSIS — R2681 Unsteadiness on feet: Secondary | ICD-10-CM | POA: Insufficient documentation

## 2022-07-05 DIAGNOSIS — R262 Difficulty in walking, not elsewhere classified: Secondary | ICD-10-CM | POA: Diagnosis present

## 2022-07-05 DIAGNOSIS — M6281 Muscle weakness (generalized): Secondary | ICD-10-CM

## 2022-07-05 DIAGNOSIS — S14129S Central cord syndrome at unspecified level of cervical spinal cord, sequela: Secondary | ICD-10-CM | POA: Insufficient documentation

## 2022-07-05 DIAGNOSIS — S72001A Fracture of unspecified part of neck of right femur, initial encounter for closed fracture: Secondary | ICD-10-CM | POA: Insufficient documentation

## 2022-07-05 DIAGNOSIS — R2689 Other abnormalities of gait and mobility: Secondary | ICD-10-CM

## 2022-07-05 DIAGNOSIS — S72002A Fracture of unspecified part of neck of left femur, initial encounter for closed fracture: Secondary | ICD-10-CM | POA: Diagnosis present

## 2022-07-05 DIAGNOSIS — R269 Unspecified abnormalities of gait and mobility: Secondary | ICD-10-CM | POA: Insufficient documentation

## 2022-07-05 DIAGNOSIS — R278 Other lack of coordination: Secondary | ICD-10-CM | POA: Insufficient documentation

## 2022-07-05 NOTE — Therapy (Signed)
OUTPATIENT PHYSICAL THERAPY NEURO TREATMENT/RECERT/Physical Therapy Progress Note   Dates of reporting period  05/10/2022   to   07/05/2022   Patient Name: Sherry [name] MRN: [id number] DOB:1934/02/04, 88 y.o., female Today's Date: 07/05/2022   PCP: Sherry Mealing, MD REFERRING PROVIDER: Deeann [name]   PT End of Session - 07/05/22 1107     Visit Number 50    Number of Visits 74    Date for PT Re-Evaluation 09/27/22    Authorization Time Period --    Progress Note Due on Visit 60    Equipment Utilized During Treatment Gait belt    Activity Tolerance Patient tolerated treatment well    Behavior During Therapy [facility] for tasks assessed/performed                            Past Medical History:  Diagnosis Date   Acute blood loss anemia    Arthritis    Cancer (HCC)    skin   Central cord syndrome at C6 level of cervical spinal cord (HCC) 11/29/2017   Hypertension    Protein-calorie malnutrition, severe (HCC) 01/24/2018   S/P insertion of IVC (inferior vena caval) filter 01/24/2018   Tetraparesis (HCC)    Past Surgical History:  Procedure Laterality Date   ANTERIOR CERVICAL DECOMP/DISCECTOMY FUSION N/A 11/29/2017   Procedure: Cervical five-six, six-seven Anterior Cervical Decompression Fusion;  Surgeon: Sherry Pierini, MD;  Location: [facility] OR;  Service: Neurosurgery;  Laterality: N/A;  Cervical five-six, six-seven Anterior Cervical Decompression Fusion   CATARACT EXTRACTION     FEMUR IM NAIL Left 08/16/2021   Procedure: INTRAMEDULLARY (IM) NAIL FEMORAL;  Surgeon: Sherry Saint, MD;  Location: [facility] ORS;  Service: Orthopedics;  Laterality: Left;   IR IVC FILTER PLMT / S&I /IMG GUID/MOD SED  12/08/2017   Patient Active Problem List   Diagnosis Date Noted   Hypertensive heart disease with other congestive heart failure (HCC) 02/14/2022   Chronic indwelling Foley catheter 02/14/2022   Closed hip fracture (HCC) 08/15/2021   DVT (deep venous thrombosis)  (HCC) 08/15/2021   Quadriplegia (HCC) 08/15/2021   Fall 08/15/2021   Constipation due to slow transit 08/31/2018   Trauma 06/05/2018   Neuropathic pain 06/05/2018   Neurogenic bowel 06/05/2018   Vaginal yeast infection 01/30/2018   Healthcare-associated pneumonia 01/25/2018   Chronic allergic rhinitis 01/24/2018   Depression with anxiety 01/24/2018   UTI due to Klebsiella species 01/24/2018   Protein-calorie malnutrition, severe (HCC) 01/24/2018   S/P insertion of IVC (inferior vena caval) filter 01/24/2018   Tetraparesis (HCC) 01/20/2018   Neurogenic bladder 01/20/2018   Reactive depression    Benign essential HTN    Acute postoperative anemia due to expected blood loss    Central cord syndrome at C6 level of cervical spinal cord (HCC) 11/29/2017   Allergy to alpha-gal 11/25/2016   SCC (squamous cell carcinoma) 04/08/2014    ONSET DATE: 08/15/2021 (fall with B hip fx); Initial injury was in 2019- quadraplegia due to central cord syndrome of C6 secondary to neck fracture.   REFERRING DIAG: Bilateral Hip fx; Left Open- s/p Left IM nail femoral on 08/16/2021; Right closed  THERAPY DIAG:  Muscle weakness (generalized)  Other lack of coordination  Central cord syndrome, sequela (HCC)  Abnormality of gait and mobility  Difficulty in walking, not elsewhere classified  Closed fracture of both hips, initial encounter (HCC)  Other abnormalities of gait and mobility  Rationale for Evaluation and Treatment Rehabilitation  SUBJECTIVE:   SUBJECTIVE STATEMENT:    Patient reports she is learning her new power wheelchair- stating the controller sensitivity is too loose.   Pt accompanied by:  Paid caregiver-   PERTINENT HISTORY: Patient was previous patient (see previous episode for specifics) She experienced a fall at home on 08/15/2021 and was in Hospital from 08/15/2021- 08/20/2021.  Most recent history per Dr. Shonna [name] on 08/20/2021.  Sherry [name] is a 88 y.o. female with  medical history significant of quadriplegia due to central cord syndrome of C6 secondary to neck fracture, hypertension, depression with anxiety, neurogenic bladder, bilateral DVT on Xarelto (s/p of IVC filter placement), who presents with fall and hip pain.   Patient has history of quadriplegia, and has been doing PT/OT with improvement of function. Pt has some use of both arms and can feed herself. She can walk a little bit with assistance. Pt accidentally fell at about 930 this morning after taking shower.  She injured her hips. Hospital Course:    Closed left hip fracture [facility]) s/p mechanical fall Closed right hip fracture (?old) Acute blood loss anemia --pt is s/p  INTRAMEDULLARY (IM) NAIL FEMORAL-left on 7/16  PAIN:  Are you having pain? NO: NPRS scale: 0/10 Pain location: Right hip Pain description: Ache Aggravating factors: any active Movement of right side Relieving factors: Rest  PRECAUTIONS: Fall  WEIGHT BEARING RESTRICTIONS Yes Bilateral non-weight bearing B LE's  FALLS: Has patient fallen in last 6 months? Yes. Number of falls 1  LIVING ENVIRONMENT: Lives with: lives with their family and lives in a nursing home Lives in: Other [facility] term care at [facility] Stairs: No Has following equipment at home: Wheelchair (power), Ramped entry, and hoyer lift  PLOF: Needs assistance with ADLs, Needs assistance with homemaking, Needs assistance with gait, Needs assistance with transfers, and Leisure: Work puzzles, read, Watch TV  PATIENT GOALS Patient goal is to walk; Be able to strengthen my arms for better use  OBJECTIVE:   DIAGNOSTIC FINDINGS: Overall cognitive statusEXAM: DG HIP (WITH OR WITHOUT PELVIS) 2-3V LEFT; DG HIP (WITH OR WITHOUT PELVIS) 2-3V RIGHT   COMPARISON:  CT of the abdomen and pelvis 01/21/2020   FINDINGS: Left proximal femur fracture noted just below the intertrochanteric region. Marked ventral angulation noted on the cross-table image. Slight varus  angulation present   Comminuted intratrochanteric fracture present on the right also with marked ventral angulation.   IMPRESSION: 1. Bilateral hip fractures. 2. Comminuted intertrochanteric fracture of the right hip. 3. Angulated fracture of the proximal left femur just below the intertrochanteric region.     Electronically Signed   By: Marin Roberts M.D.   On: 08/15/2021 11:54:   COGNITION:  Within functional limits for tasks assessed   SENSATION: Light touch: Impaired   EDEMA:  None  Strength R/L 2-/2- Shoulder flexion (anterior deltoid/pec major/coracobrachialis, axillary n. (C5-6) and musculocutaneous n. (C5-7)) 2-/3+ (within available ROM around 130 on lef)Shoulder abduction (deltoid/supraspinatus, axillary/suprascapular n, C5) 3+/3+ Shoulder external rotation (infraspinatus/teres minor) 4/4 Shoulder internal rotation (subcapularis/lats/pec major) 3+/3+ Shoulder extension (posterior deltoid, lats, teres major, axillary/thoracodorsal n.) 3+/3+ Shoulder horizontal abduction 4/4 Elbow flexion (biceps brachii, brachialis, brachioradialis, musculoskeletal n, C5-6) 4/4 Elbow extension (triceps, radial n, C7) 2-/2- Wrist Extension 3+/3+ Wrist Flexion   LOWER EXTREMITY MMT:    MMT Right 01/11/22 Left 01/11/22  Hip flexion 2- 2-  Hip extension 2- 2-  Hip abduction 2- 2-  Hip adduction 2- 3  Hip internal rotation 2- 2-  Hip  external rotation 2- 2-  Knee flexion 2- 2-  Knee extension 2+ 3+  Ankle dorsiflexion 2- NT - boot  Ankle plantarflexion 2- NT- boot  Ankle inversion 2- NT- boot  Ankle eversion 2- NT- boot  (Blank rows = not tested)  BED MOBILITY:  Unable to test due to NWB status of BLE- Patient currently dependent with all bed mobility using hoyer and caregiver assist  TRANSFERS: Currently BLE NWB- Dependent on use of hoyer for all transfers  FUNCTIONAL TESTs:   PATIENT SURVEYS:  FOTO 12  TODAY'S TREATMENT:  Physical therapy treatment  session today consisted of completing assessment of goals and administration of testing as demonstrated and documented in flow sheet, treatment, and goals section of this note. Addition treatments may be found below.   THEREX:   AAROM Right knee ext and hip flex x 15 reps AROM  Left knee ext and hip flex x 15 reps each.     THERAPEUTIC ACTIVITIES:  Sit to stand from seat of power w/c x 2- Max assist of 1 from seat of power w/c to practice in prep for SPT and later in // bars with patient reaching for // bars once up- PT assisting with trunk/hip extension     Static stand in // bars- 1 trials - 2 min 3 sec with focus on standing upright- No blocking of either knee. Unable to observe if knee abnormalities secondary to PT standing in front and bracing patient.       Pt educated throughout session about proper posture and technique with exercises. Improved exercise technique, movement at target joints, use of target muscles after min to mod verbal, visual, tactile cues.   PATIENT EDUCATION: Education details: PT plan of care/use of resistive bands Person educated: Patient Education method: Explanation, Demonstration, Tactile cues, and Verbal cues Education comprehension: verbalized understanding, returned demonstration, verbal cues required, tactile cues required, and needs further education   HOME EXERCISE PROGRAM: No changes at this time  Access Code: [facility] URL: [email or web address] Date: 11/23/2021 Prepared by: Precious Bard Exercises - Seated Gluteal Sets - 2 x daily - 7 x weekly - 2 sets - 10 reps - 5 hold - Seated Quad Set - 2 x daily - 7 x weekly - 2 sets - 10 reps - 5 hold - Seated Long Arc Quad - 2 x daily - 7 x weekly - 2 sets - 10 reps - 5 hold - Seated March - 2 x daily - 7 x weekly - 2 sets - 10 reps - 5 hold - Seated Hip Abduction - 2 x daily - 7 x weekly - 2 sets - 10 reps - 5 hold - Seated Shoulder Shrugs - 2 x daily - 7 x weekly - 2 sets - 10 reps - 5 hold -  Wheelchair Pressure Relief - 2 x daily - 7 x weekly - 2 sets - 10 reps - 5 hold  GOALS: Goals reviewed with patient? Yes  SHORT TERM GOALS: Target date: 02/22/2022  Pt will be independent with initial UE strengthening HEP in order to improve strength and balance in order to decrease fall risk and improve function at home and work. Baseline: 10/19/2021- patient with no formal UE HEP; 12/02/2021= Patient verbalized knowledge of HEP including use of theraband for UE strengthening.  Goal status: GOAL MET  LONG TERM GOALS: Target date: 09/27/2022  Pt will be independent with final for UE/LE HEP in order to improve strength and balance in order to decrease fall risk and  improve function at home and work. Baseline: Patient is currently BLE NWB and unable to participate in HEP. Has order for UE strengthening. 01/11/2022- Patient now able to participate in LE strengthening although NWB still- good understanding for some basic exercises. Will keep goal active to incorporate progressive LE strengthening exercises. 04/07/2022= Patient still participating in progressive LE seated HEP and has no questions at this time.  Goal status: MET  2.  Pt will improve FOTO to target score of 35  to display perceived improvements in ability to complete ADL's.  Baseline: 10/19/2021= 12; 12/02/2021= 17; 01/11/2022=17; 04/07/2022= 17; 07/05/2022=17 Goal status: Ongoing  3.  Pt will increase strength of B UE  by at least 1/2 MMT grade in order to demonstrate improvement in strength and function  Baseline: patient has range of 2-/5 to 4/5 BUE Strength; 12/02/2021= 4/5 except with wrist ext. 01/11/2022= 4/5 B UE strength expept for wrist Goal status: Goal revised-no longer appropriate- working with OT on all UE strengthening.   4. Pt. Will increase strength of RLE by at least 1/2 MMT grade in order to demonstrate improvement in Standing/transfers.  Baseline: 2-/5 Right hip flex/knee ext/flex; 04/07/2022= 2- with right hip flex/knee ext  (lacking 28 deg from zero) 4/8= Patient able to ext right knee lacking 18 deg from zero. 07/05/2022=Left knee approx 8 deg from zero and right knee = 23 deg from zero  Goal status: Progressing  5. Pt. Will demo ability to stand pivot transfer with max assist for improved functional mobility and less dependent need on mechanical device.   Baseline: Dependent on hoyer lift for all transfers; 04/07/2022- Unable to test secondary to patient with recent UTI and right hand procedure - will attempt to reassess next visit. ; 04/12/22: Unable to successfully stand due to feet plates on loaned power w/c; 05/10/2022- Patient still having to use rental chair and has not received her original power w/c back to practice SPT. 07/04/2021= Patient just received new power w/c and attempted standing from 1st time today- Patient able to stand with max assist today from w/c and did not attempt to perform pivot transfer- difficulty with static standing and placing weight on right LE.   Goal status: ONGOING  6. Pt. Will demonstrate improved functional LE strength as seen by ability to stand > 2 min for improved transfer ability and pregait abilities.   Baseline: not assessed today due to recent dx: UTI; 04/07/2022- Unable to test secondary to patient with recent UTI and right hand procedure - will attempt to reassess next visit; 04/12/22: Unable to successfully stand due to feet plates on loaned power w/c4/09/2022- Patient still having to use rental chair and has not received her original power w/c back to practice Standing. 07/05/2022- Patient able to stand with max assist at trunk today for 2 min 3 sec. Will keep goal active to ensure consistency.   Goal status: ONGOING  ASSESSMENT:  CLINICAL IMPRESSION: Patient presents with good motivation for today's recert/progress note visit. Progress has been limited this cert due to patient not having a consistent w/c and dealing with health issues. She continues to work on developing her LE  strength and has recently resumed some standing activities. She presents with some improved knee ext ability and later was able to stand for 2 min with max assist at trunk. All goals still appropriate and patient will benefit from continued LE strengthening with focus this cert more on transfers and standing.  Patient's condition has the potential to improve in response  to therapy. Maximum improvement is yet to be obtained. The anticipated improvement is attainable and reasonable in a generally predictable time.  Patient reports  Pt will continue to benefit from skilled PT services to optimize independence and reduced caregiver support.    OBJECTIVE IMPAIRMENTS Abnormal gait, decreased activity tolerance, decreased balance, decreased coordination, decreased endurance, decreased mobility, difficulty walking, decreased ROM, decreased strength, hypomobility, impaired flexibility, impaired UE functional use, postural dysfunction, and pain.   ACTIVITY LIMITATIONS carrying, lifting, bending, sitting, standing, squatting, sleeping, stairs, transfers, bed mobility, continence, bathing, toileting, dressing, self feeding, reach over head, hygiene/grooming, and caring for others  PARTICIPATION LIMITATIONS: meal prep, cleaning, laundry, medication management, personal finances, interpersonal relationship, driving, shopping, community activity, and yard work  PERSONAL FACTORS Age, Time since onset of injury/illness/exacerbation, and 1-2 comorbidities: HTN, cervical Sx  are also affecting patient's functional outcome.   REHAB POTENTIAL: Good  CLINICAL DECISION MAKING: Evolving/moderate complexity  EVALUATION COMPLEXITY: Moderate  PLAN: PT FREQUENCY: 1-2x/week  PT DURATION: 12 weeks  PLANNED INTERVENTIONS: Therapeutic exercises, Therapeutic activity, Neuromuscular re-education, Balance training, Gait training, Patient/Family education, Self Care, Joint mobilization, DME instructions, Dry Needling, Electrical  stimulation, Wheelchair mobility training, Spinal mobilization, Cryotherapy, Moist heat, and Manual therapy  PLAN FOR NEXT SESSION: Continue with weight bearing activities, hoyer over to mat table and practice standing next visit and LE strengthening as appropriate.   Louis Meckel, PT Physical Therapist- [facility]  07/05/2022, 11:13 AM

## 2022-07-05 NOTE — Therapy (Signed)
Occupational Therapy Treatment/Recertification Note      Patient Name: Sherry [name] MRN: [id number] DOB:December 07, 1934, 88 y.o., female Today's Date: 07/05/2022                                         PCP: Dr. Tillman Abide REFERRING PROVIDER: Dr. Tillman Abide   OT End of Session - 07/05/22 1112     Visit Number [id number]    Number of Visits 72    Date for OT Re-Evaluation 09/27/22    OT Start Time 1105    OT Stop Time 1145    OT Time Calculation (min) 40 min    Activity Tolerance Patient tolerated treatment well    Behavior During Therapy [facility] for tasks assessed/performed                  Past Medical History:  Diagnosis Date   Acute blood loss anemia    Arthritis    Cancer (HCC)    skin   Central cord syndrome at C6 level of cervical spinal cord (HCC) 11/29/2017   Hypertension    Protein-calorie malnutrition, severe (HCC) 01/24/2018   S/P insertion of IVC (inferior vena caval) filter 01/24/2018   Tetraparesis (HCC)    Past Surgical History:  Procedure Laterality Date   ANTERIOR CERVICAL DECOMP/DISCECTOMY FUSION N/A 11/29/2017   Procedure: Cervical five-six, six-seven Anterior Cervical Decompression Fusion;  Surgeon: Jadene Pierini, MD;  Location: [facility] OR;  Service: Neurosurgery;  Laterality: N/A;  Cervical five-six, six-seven Anterior Cervical Decompression Fusion   CATARACT EXTRACTION     FEMUR IM NAIL Left 08/16/2021   Procedure: INTRAMEDULLARY (IM) NAIL FEMORAL;  Surgeon: Deeann Saint, MD;  Location: [facility] ORS;  Service: Orthopedics;  Laterality: Left;   IR IVC FILTER PLMT / S&I /IMG GUID/MOD SED  12/08/2017   Patient Active Problem List   Diagnosis Date Noted   Hypertensive heart disease with other congestive heart failure (HCC) 02/14/2022   Chronic indwelling Foley catheter 02/14/2022   Closed hip fracture (HCC) 08/15/2021   DVT (deep venous thrombosis) (HCC) 08/15/2021   Quadriplegia (HCC) 08/15/2021   Fall 08/15/2021   Constipation due to slow transit  08/31/2018   Trauma 06/05/2018   Neuropathic pain 06/05/2018   Neurogenic bowel 06/05/2018   Vaginal yeast infection 01/30/2018   Healthcare-associated pneumonia 01/25/2018   Chronic allergic rhinitis 01/24/2018   Depression with anxiety 01/24/2018   UTI due to Klebsiella species 01/24/2018   Protein-calorie malnutrition, severe (HCC) 01/24/2018   S/P insertion of IVC (inferior vena caval) filter 01/24/2018   Tetraparesis (HCC) 01/20/2018   Neurogenic bladder 01/20/2018   Reactive depression    Benign essential HTN    Acute postoperative anemia due to expected blood loss    Central cord syndrome at C6 level of cervical spinal cord (HCC) 11/29/2017   Allergy to alpha-gal 11/25/2016   SCC (squamous cell carcinoma) 04/08/2014    REFERRING DIAG: Central Cord Syndrome at C6 of the Cervical Spinal Cord, Fall with Bilateral Closed Hip Fractures, with ORIF repair with Intramedullary Nailing of the Left Hip Fracture.  THERAPY DIAG:  Muscle weakness (generalized)  Rationale for Evaluation and Treatment Rehabilitation  PERTINENT HISTORY: Patient s/p fall November 29, 2017 resulting in diagnosis of central cord syndrome at C 6 level.  She has had therapy in multiple venues but with recent move to [facility], therapy staff recommended she seek outpatient therapy  for her needs. Pt. Had a recent fall with Bilateral Closed Hip Fractures, with ORIF repair with Intramedullary Nailing of the Left Hip Fracture.   PRECAUTIONS: None  SUBJECTIVE:   Pt. received her new power w/c.  PAIN:  Are you having pain? No  OBJECTIVE:     Measurements were obtained, and goals were reviewed with the Pt.    PATIENT EDUCATION: RUE Distal ROM, and strength Person educated: Patient Education method: Explanation and Demonstration Education comprehension: verbalized understanding, returned demonstration, and needs further education   HOME EXERCISE PROGRAM Continue with ongoing  HEPs  Measurements:  12/09/2021:  Shoulder flexion: R: 120(136), L: 138(150) Shoulder abduction: R: 108(120), L: 110(120) Elbow: R: 0-148, L: 0-146 Wrist flexion: R: 55(62), L: 78 Writ extension: R: 34(55), L: 3(4) RD: R: 12(20), L: 16(26) UD: R: 8(24), L: 6(18) Thumb radial abduction: R: 11(20), L: 8(20) Digit flexion to the [facility]: R:  2nd: 5cm(3.5cm), 3rd: 7.5 cm(5cm), 4th: 7cm(3cm), 5th: 6cm(5.5cm) L: 2nd: 8cm(8cm), 3rd: 8cm(8cm), 4th: 7.5cm(7cm), 5th: 7cm(7cm)  02/03/2022:   Shoulder flexion: R: 122(138), L: 148(154) Shoulder abduction: R: 109(125), L: 125(125 Elbow: R: 0-148, L: 0-146 Wrist flexion: R: 60(68), L: 79 Writ extension: R: 40(55), L: 3(10) RD: R: 14(24), L: 20(28) UD: R: 8(24), L: 6(18) Thumb radial abduction: R: 20(25), L: 8(20) Digit flexion to the [facility]: R:  2nd: 4.5cm(3cm), 3rd: 6.5cm(4.5cm), 4th: 6.5 cm(3cm), 5th: 4.5cm(5cm) L: 2nd: 8cm(8cm), 3rd: 8cm(7cm), 4th: 7.5cm(7cm), 5th: 6.5cm(6.5cm)   04/07/2022:   Shoulder flexion: R: 125(150), L: 160(160) Shoulder abduction: R: 109(125), L: 125(125 Elbow: R: 0-148, L: 0-146 Wrist flexion: R: 60(68), L: 79 Writ extension: R: 40(55), L: 5(18) RD: R: 14(24), L: 20(28) UD: R: 18(24), L: 8(18) Thumb radial abduction: R: 20(25), L: 8(20) Digit flexion to the [facility]: R:  2nd: 4.5 cm(4 cm), 3rd: 6.5cm(3cm), 4th: 7 cm (5cm), 5th: 5cm(5cm) L: 2nd: 8cm(8cm), 3rd: 8cm(7cm), 4th: 7.5cm(7cm), 5th: 7cm(7cm)   07/05/2022:   Shoulder flexion: R: 125(154), L: 160(160) Shoulder abduction: R: 95(130), L: 143(143) Elbow: R: 0-148, L: 0-146 Wrist flexion: R: 60(68), L: 79 Writ extension: R: 40(60), L: 0(12) RD: R: 10(24), L: 12(20) UD: R: 16(24), L: 8(16) Thumb radial abduction: R: 20(25), L: 8(20) Digit flexion to the [facility]: R:  2nd: 4 cm(4cm), 3rd: 6.5cm( 3.5cm), 4th: 7 cm (5cm), 5th: 6cm(5cm) L: 2nd: 8cm(8cm), 3rd: 8cm(7cm), 4th: 8cm(7cm), 5th: 7cm(7cm)            OT Long Term Goals - 07/06/21 1106                                                                                           Target Date:  09/27/2022      OT LONG TERM GOAL #1   Title Patient and caregiver will demonstrate understanding of home exercise program for ROM.    Baseline 12/09/2021: Pt. Reports the restorative ROM program has not resumed at the facility. 10/28/2021: Pt. Is attempting exercises/stretches at home. The facility restorative aides have not resumed exercises. 09/14/2021:  Pt. Reports inconsistencies with restorative ROM at  her care home. Pt/caregiver education to be provided about ongoing ROM needs.  Time 12    Period Weeks    Status Deferred     OT LONG TERM GOAL #2   Title Pt.  will improve BUE strength to be able to sustain her UE's in elevation to efficiently, and effectively perform hair care, and self-grooming tasks.   Baseline 02/03/2022: Pt. has improved, and is now able to sustain her Bilateral UEs in elevation long enough to efficiently, and effectively perform haircare, and self-grooming tasks. 12/09/2021: right shoulder 4-/5, left shoulder 4/5. Pt. has improved to be able to reach up for hair care, and self-grooming tasks. 10/28/2021: Right shoulder 3+/5, Left shoulder 4-/5 09/14/2021:  BUE shoulder strength: 3+/5 for shoulder flexion, and abduction.   Time 12    Period Weeks    Status Achieved               OT LONG TERM GOAL #3   Title Patient will demonstrate improved composite finger flexion to be able to firmly hold  and use adaptive devices during ADLs, and IADLs.   Baseline 07/05/2022: Pt. presents with digit MP, PIP, and DIP extensor tightness limiting her ability to securely grip objects in her bilateral hands.  04/07/2022: Pt. Has improved with right 2nd, and 3rd digit flexion towards the [facility]. Pt. Continues to have difficulty securely holding and applying deodorant.02/03/2022: Pt. Has improved with digit flexion, however continues to have difficulty securely holding and using deoderant. 12/09/2021: Bilateral  hand/digit MP, PIP, and DIP extension tightness limits her ability achieve digit flexion to hold, and apply deodorant. 10/28/2021:  Pt. continues to have difficulty holding the deodorant. 01/12/2021: Pt. Presents with limited digit extension. Pt. Is able to initiate holding deodorant, however is unable to hold it while using it.   Time 12    Period Weeks    Status Revised   Target Date                  OT LONG TERM GOAL #4   Title Pt. Will improve bilateral wrist extension in preparation for anticipating, and initiating reaching for objects at the table.    Baseline 07/05/2022: Right: 40(60) Left: 0(12) 04/07/2022: Rght: 40(55) Left: 5(18) 02/03/2022: Right: 34(55) Left: 3(4) 12/09/2021: Right  34(55)  Left  3(4) 10/28/2021:  Right 22(38), Left 0(15) 09/14/2021:  Right: 17(35), left 2(15)   Time 12   Period weeks   Status Ongoing   Target Date      OT LONG TERM GOAL #5   Title Pt. will write, and sign her name with 100% legibility, and modified independence.    Baseline 6/03/204: Pt. Is consistently filling out  daily meal menus, and puzzles. 04/07/2022: Pt. Reports that she continues to present with shaky appearing writing. Pt.reports that she is writing more when filling out her menu, and complete puzzles 02/03/2022: Pt. Continues to report legibility appears "shaky" when attempting signing her name. Pt.reports that she is writing more when filling out her menu, and complete puzzles.12/09/2021: Pt. Reports legibility appears "shaky" when attempting signing her name. Pt.reports that she is writing more when filling out her menu. 10/28/2021: pt. is able to sign her name, however legibility continues to be inconsistent. Pt. Reports "shaky appearance". 09/14/2021: Pt. continues to fill out her daily menu and complete puzzles. Pt. Continues to present with limited writing legibility.   Time 12    Period Weeks    Status  Ongoing         OT LONG TERM GOAL  #6   TITLE Pt. will  turn pages in a book with  modified independence.    Baseline 02/03/2022: Pt. Is able to effectively turn pages of a book. 10/28/2021: Pt. Is able to consistently turn book pages. 09/14/2021: Pt. has difficulty turning pages.    Time 12    Period Weeks    Status Achieved       OT LONG TERM GOAL  #7  TITLE Pt. Will increase  bilateral lateral pinch strength by 2 lbs to be able to securely grasp items during ADLs, and IADL tasks.   Baseline 07/05/2022: TBD 07/2022: NT-Pinch meter out for calibration. 02/03/2022: Right: 6#, Left: 4# 12/09/2021:  Right: 6#, Left: 4#   Time 12  Period Weeks  Status Ongoing           OT LONG TERM GOAL  #8  TITLE Pt. Will button a sweater with large 1" buttons, and zip her jacket with modified independence.   Baseline 06/07/2022: Pt. is able to use a button hook to button a sweater. Pt. Reports that she does not wear button down clothing often.  04/07/2022: Pt. Has difficulty manipulating buttons, however was introduced to a buttonhook. Pt. Was able to hold, and use a buttonhook for buttons on a shirt. 02/03/2022: Pt. is unable to manipulate buttons on her sweater, or zippers on her jacket.  Time 12  Period Weeks  Status Achieved   OT LONG TERM GOAL  #9  TITLE Pt. will complete plant care with modified independence.  Baseline 07/05/2022: pt. Is able to water, and care for some  of her plants. Pt. Has more difficulty with plants that are harder to reach. 04/07/2022: Pt. is now able to hold a cup and water her plants.02/03/2022: Pt. has difficulty caring for her plants.   Time 12  Period Weeks  Status Ongoing   OT LONG TERM GOAL  #10  TITLE Pt. will demonstrate adaptive techniques to assist with the efficiency of self-dressing, or morning care tasks.  Baseline 07/05/2022: Continue with goal.  04/07/2022: Pt. Continues to require assist with the efficiency of self-dressing, and morning care tasks.02/03/2022: Pt. requires assist from caregivers 2/2 time limitations during morning care.   Time 12   Period Weeks  Status Ongoing   Title Pt.  will improve BUE strength to improve ADL, and IADL functioning.  Baseline 07/05/2022: shoulder flexion: right 4-/5, abduction: 3+/5, elbow flexion: right: 5/5, left 5/5, extension: right: 4-/5, left 4-/5, wrist extension: right: 3-/5, left: 2-/5  Time 12   Period Weeks   Status New     Plan - 07/22/21 1737     Clinical Impression Statement Pt. Has had multiple cancellations over this past recertification period due to  transportation/caregiver issues, conflicting appointments, and having sustained a left shoulder injury. Pt.'s shoulder injury has now healed. Pt. Has received her new electric w/c last week. Pt. Has progressed to be able to wash her face, use a buttonhook for buttoning, water, and care for several plants, open drawers, as well as hold, and fill a cup at the fountain. Pt. Continues to present with limited bilateral digit MP, PIP, and DIP ROM 2/2 extensor tightness, and decreased bilateral UE strength which continue to limit her ability to perform daily ADL, and IADL tasks. Pt. continues to benefit from working on impoving ROM in order to work towards increasing bilateral hand grasp on objects, and increase engagement of bilateral hands during ADLs, and IADL tasks.  OT Occupational Profile and History Detailed Assessment- Review of Records and additional review of physical, cognitive, psychosocial history related to current functional performance    Occupational performance deficits (Please refer to evaluation for details): ADL's;IADL's;Leisure;Social Participation    Body Structure / Function / Physical Skills ADL;Continence;Dexterity;Flexibility;Strength;ROM;Balance;Coordination;FMC;IADL;Endurance;UE functional use;Decreased knowledge of use of DME;[facility]    Psychosocial Skills Coping Strategies    Rehab Potential Fair    Clinical Decision Making Several treatment options, min-mod task modification necessary     Comorbidities Affecting Occupational Performance: Presence of comorbidities impacting occupational performance    Comorbidities impacting occupational performance description: contractures of bilateral hands, dependent transfers,    Modification or Assistance to Complete Evaluation  No modification of tasks or assist necessary to complete eval    OT Frequency 2x / week    OT Duration 12 weeks    OT Treatment/Interventions Self-care/ADL training;Cryotherapy;Therapeutic exercise;DME and/or AE instruction;Balance training;Neuromuscular education;Manual Therapy;Splinting;Moist Heat;Passive range of motion;Therapeutic activities;Patient/family education    Plan continue to progress ROM of digits, wrists and shoulders as it pertains to completion of ADL tasks that pt values.    Consulted and Agree with Plan of Care Patient            Olegario Messier, MS, OTR/L  07/05/2022       `

## 2022-07-07 ENCOUNTER — Ambulatory Visit: Payer: Medicare HMO

## 2022-07-07 ENCOUNTER — Ambulatory Visit: Payer: Medicare HMO | Admitting: Occupational Therapy

## 2022-07-07 DIAGNOSIS — R2689 Other abnormalities of gait and mobility: Secondary | ICD-10-CM

## 2022-07-07 DIAGNOSIS — R2681 Unsteadiness on feet: Secondary | ICD-10-CM

## 2022-07-07 DIAGNOSIS — M6281 Muscle weakness (generalized): Secondary | ICD-10-CM

## 2022-07-07 DIAGNOSIS — S14129S Central cord syndrome at unspecified level of cervical spinal cord, sequela: Secondary | ICD-10-CM

## 2022-07-07 DIAGNOSIS — S72001A Fracture of unspecified part of neck of right femur, initial encounter for closed fracture: Secondary | ICD-10-CM

## 2022-07-07 DIAGNOSIS — R269 Unspecified abnormalities of gait and mobility: Secondary | ICD-10-CM

## 2022-07-07 DIAGNOSIS — R262 Difficulty in walking, not elsewhere classified: Secondary | ICD-10-CM

## 2022-07-07 DIAGNOSIS — R278 Other lack of coordination: Secondary | ICD-10-CM

## 2022-07-07 NOTE — Therapy (Signed)
OUTPATIENT PHYSICAL THERAPY NEURO TREATMENT   Patient Name: Sherry [name] MRN: [id number] DOB:September 12, 1934, 88 y.o., female Today's Date: 07/07/2022   PCP: Earnestine Mealing, MD REFERRING PROVIDER: Deeann Saint   PT End of Session - 07/07/22 1213     Visit Number 51    Number of Visits 74    Date for PT Re-Evaluation 09/27/22    Progress Note Due on Visit 60    PT Start Time 1147    PT Stop Time 1230    PT Time Calculation (min) 43 min    Activity Tolerance Patient tolerated treatment well    Behavior During Therapy [facility] for tasks assessed/performed                            Past Medical History:  Diagnosis Date   Acute blood loss anemia    Arthritis    Cancer (HCC)    skin   Central cord syndrome at C6 level of cervical spinal cord (HCC) 11/29/2017   Hypertension    Protein-calorie malnutrition, severe (HCC) 01/24/2018   S/P insertion of IVC (inferior vena caval) filter 01/24/2018   Tetraparesis (HCC)    Past Surgical History:  Procedure Laterality Date   ANTERIOR CERVICAL DECOMP/DISCECTOMY FUSION N/A 11/29/2017   Procedure: Cervical five-six, six-seven Anterior Cervical Decompression Fusion;  Surgeon: Jadene Pierini, MD;  Location: [facility] OR;  Service: Neurosurgery;  Laterality: N/A;  Cervical five-six, six-seven Anterior Cervical Decompression Fusion   CATARACT EXTRACTION     FEMUR IM NAIL Left 08/16/2021   Procedure: INTRAMEDULLARY (IM) NAIL FEMORAL;  Surgeon: Deeann Saint, MD;  Location: [facility] ORS;  Service: Orthopedics;  Laterality: Left;   IR IVC FILTER PLMT / S&I /IMG GUID/MOD SED  12/08/2017   Patient Active Problem List   Diagnosis Date Noted   Hypertensive heart disease with other congestive heart failure (HCC) 02/14/2022   Chronic indwelling Foley catheter 02/14/2022   Closed hip fracture (HCC) 08/15/2021   DVT (deep venous thrombosis) (HCC) 08/15/2021   Quadriplegia (HCC) 08/15/2021   Fall 08/15/2021   Constipation due to slow  transit 08/31/2018   Trauma 06/05/2018   Neuropathic pain 06/05/2018   Neurogenic bowel 06/05/2018   Vaginal yeast infection 01/30/2018   Healthcare-associated pneumonia 01/25/2018   Chronic allergic rhinitis 01/24/2018   Depression with anxiety 01/24/2018   UTI due to Klebsiella species 01/24/2018   Protein-calorie malnutrition, severe (HCC) 01/24/2018   S/P insertion of IVC (inferior vena caval) filter 01/24/2018   Tetraparesis (HCC) 01/20/2018   Neurogenic bladder 01/20/2018   Reactive depression    Benign essential HTN    Acute postoperative anemia due to expected blood loss    Central cord syndrome at C6 level of cervical spinal cord (HCC) 11/29/2017   Allergy to alpha-gal 11/25/2016   SCC (squamous cell carcinoma) 04/08/2014    ONSET DATE: 08/15/2021 (fall with B hip fx); Initial injury was in 2019- quadraplegia due to central cord syndrome of C6 secondary to neck fracture.   REFERRING DIAG: Bilateral Hip fx; Left Open- s/p Left IM nail femoral on 08/16/2021; Right closed  THERAPY DIAG:  Muscle weakness (generalized)  Other lack of coordination  Central cord syndrome, sequela (HCC)  Abnormality of gait and mobility  Difficulty in walking, not elsewhere classified  Closed fracture of both hips, initial encounter (HCC)  Other abnormalities of gait and mobility  Unsteadiness on feet  Rationale for Evaluation and Treatment Rehabilitation  SUBJECTIVE:   SUBJECTIVE STATEMENT:  Patient reports feeling a little more comfortable using her controller and moving around better with power wheelchair.  Pt accompanied by:  Paid caregiver-   PERTINENT HISTORY: Patient was previous patient (see previous episode for specifics) She experienced a fall at home on 08/15/2021 and was in Hospital from 08/15/2021- 08/20/2021.  Most recent history per Dr. Shonna Chock on 08/20/2021.  Sherry [name] is a 88 y.o. female with medical history significant of quadriplegia due to central  cord syndrome of C6 secondary to neck fracture, hypertension, depression with anxiety, neurogenic bladder, bilateral DVT on Xarelto (s/p of IVC filter placement), who presents with fall and hip pain.   Patient has history of quadriplegia, and has been doing PT/OT with improvement of function. Pt has some use of both arms and can feed herself. She can walk a little bit with assistance. Pt accidentally fell at about 930 this morning after taking shower.  She injured her hips. Hospital Course:    Closed left hip fracture [facility]) s/p mechanical fall Closed right hip fracture (?old) Acute blood loss anemia --pt is s/p  INTRAMEDULLARY (IM) NAIL FEMORAL-left on 7/16  PAIN:  Are you having pain? NO: NPRS scale: 0/10 Pain location: Right hip Pain description: Ache Aggravating factors: any active Movement of right side Relieving factors: Rest  PRECAUTIONS: Fall  WEIGHT BEARING RESTRICTIONS Yes Bilateral non-weight bearing B LE's  FALLS: Has patient fallen in last 6 months? Yes. Number of falls 1  LIVING ENVIRONMENT: Lives with: lives with their family and lives in a nursing home Lives in: Other Long term care at [facility] Stairs: No Has following equipment at home: Wheelchair (power), Ramped entry, and hoyer lift  PLOF: Needs assistance with ADLs, Needs assistance with homemaking, Needs assistance with gait, Needs assistance with transfers, and Leisure: Work puzzles, read, Watch TV  PATIENT GOALS Patient goal is to walk; Be able to strengthen my arms for better use  OBJECTIVE:   DIAGNOSTIC FINDINGS: Overall cognitive statusEXAM: DG HIP (WITH OR WITHOUT PELVIS) 2-3V LEFT; DG HIP (WITH OR WITHOUT PELVIS) 2-3V RIGHT   COMPARISON:  CT of the abdomen and pelvis 01/21/2020   FINDINGS: Left proximal femur fracture noted just below the intertrochanteric region. Marked ventral angulation noted on the cross-table image. Slight varus angulation present   Comminuted intratrochanteric fracture  present on the right also with marked ventral angulation.   IMPRESSION: 1. Bilateral hip fractures. 2. Comminuted intertrochanteric fracture of the right hip. 3. Angulated fracture of the proximal left femur just below the intertrochanteric region.     Electronically Signed   By: Marin Roberts M.D.   On: 08/15/2021 11:54:   COGNITION:  Within functional limits for tasks assessed   SENSATION: Light touch: Impaired   EDEMA:  None  Strength R/L 2-/2- Shoulder flexion (anterior deltoid/pec major/coracobrachialis, axillary n. (C5-6) and musculocutaneous n. (C5-7)) 2-/3+ (within available ROM around 130 on lef)Shoulder abduction (deltoid/supraspinatus, axillary/suprascapular n, C5) 3+/3+ Shoulder external rotation (infraspinatus/teres minor) 4/4 Shoulder internal rotation (subcapularis/lats/pec major) 3+/3+ Shoulder extension (posterior deltoid, lats, teres major, axillary/thoracodorsal n.) 3+/3+ Shoulder horizontal abduction 4/4 Elbow flexion (biceps brachii, brachialis, brachioradialis, musculoskeletal n, C5-6) 4/4 Elbow extension (triceps, radial n, C7) 2-/2- Wrist Extension 3+/3+ Wrist Flexion   LOWER EXTREMITY MMT:    MMT Right 01/11/22 Left 01/11/22  Hip flexion 2- 2-  Hip extension 2- 2-  Hip abduction 2- 2-  Hip adduction 2- 3  Hip internal rotation 2- 2-  Hip external rotation 2- 2-  Knee flexion 2-  2-  Knee extension 2+ 3+  Ankle dorsiflexion 2- NT - boot  Ankle plantarflexion 2- NT- boot  Ankle inversion 2- NT- boot  Ankle eversion 2- NT- boot  (Blank rows = not tested)  BED MOBILITY:  Unable to test due to NWB status of BLE- Patient currently dependent with all bed mobility using hoyer and caregiver assist  TRANSFERS: Currently BLE NWB- Dependent on use of hoyer for all transfers  FUNCTIONAL TESTs:   PATIENT SURVEYS:  FOTO 12  TODAY'S TREATMENT:    THEREX:  PROM to right and left hip/knee/ankle x 10 min AAROM Right knee ext and  hip flex 2 x 15 reps AROM  Left knee ext and hip flex 2 x 15 reps each.  Isometric hip add with pillow squeeze x 3 sec hold x 10 reps Isometric hip abd into lateral supports of W/c hold 3sec x 15 reps Manual resistive Leg press in reclined position with 2 sets of 15 reps       Pt educated throughout session about proper posture and technique with exercises. Improved exercise technique, movement at target joints, use of target muscles after min to mod verbal, visual, tactile cues.   PATIENT EDUCATION: Education details: PT plan of care/use of resistive bands Person educated: Patient Education method: Explanation, Demonstration, Tactile cues, and Verbal cues Education comprehension: verbalized understanding, returned demonstration, verbal cues required, tactile cues required, and needs further education   HOME EXERCISE PROGRAM: No changes at this time  Access Code: [email or web address] [facility] URL: [email or web address] Date: 11/23/2021 Prepared by: Precious Bard Exercises - Seated Gluteal Sets - 2 x daily - 7 x weekly - 2 sets - 10 reps - 5 hold - Seated Quad Set - 2 x daily - 7 x weekly - 2 sets - 10 reps - 5 hold - Seated Long Arc Quad - 2 x daily - 7 x weekly - 2 sets - 10 reps - 5 hold - Seated March - 2 x daily - 7 x weekly - 2 sets - 10 reps - 5 hold - Seated Hip Abduction - 2 x daily - 7 x weekly - 2 sets - 10 reps - 5 hold - Seated Shoulder Shrugs - 2 x daily - 7 x weekly - 2 sets - 10 reps - 5 hold - Wheelchair Pressure Relief - 2 x daily - 7 x weekly - 2 sets - 10 reps - 5 hold  GOALS: Goals reviewed with patient? Yes  SHORT TERM GOALS: Target date: 02/22/2022  Pt will be independent with initial UE strengthening HEP in order to improve strength and balance in order to decrease fall risk and improve function at home and work. Baseline: 10/19/2021- patient with no formal UE HEP; 12/02/2021= Patient verbalized knowledge of HEP including use of theraband for UE strengthening.  Goal status:  GOAL MET  LONG TERM GOALS: Target date: 09/27/2022  Pt will be independent with final for UE/LE HEP in order to improve strength and balance in order to decrease fall risk and improve function at home and work. Baseline: Patient is currently BLE NWB and unable to participate in HEP. Has order for UE strengthening. 01/11/2022- Patient now able to participate in LE strengthening although NWB still- good understanding for some basic exercises. Will keep goal active to incorporate progressive LE strengthening exercises. 04/07/2022= Patient still participating in progressive LE seated HEP and has no questions at this time.  Goal status: MET  2.  Pt will improve FOTO to target score of 35  to display perceived improvements in ability to complete ADL's.  Baseline: 10/19/2021= 12; 12/02/2021= 17; 01/11/2022=17; 04/07/2022= 17; 07/05/2022=17 Goal status: Ongoing  3.  Pt will increase strength of B UE  by at least 1/2 MMT grade in order to demonstrate improvement in strength and function  Baseline: patient has range of 2-/5 to 4/5 BUE Strength; 12/02/2021= 4/5 except with wrist ext. 01/11/2022= 4/5 B UE strength expept for wrist Goal status: Goal revised-no longer appropriate- working with OT on all UE strengthening.   4. Pt. Will increase strength of RLE by at least 1/2 MMT grade in order to demonstrate improvement in Standing/transfers.  Baseline: 2-/5 Right hip flex/knee ext/flex; 04/07/2022= 2- with right hip flex/knee ext (lacking 28 deg from zero) 4/8= Patient able to ext right knee lacking 18 deg from zero. 07/05/2022=Left knee approx 8 deg from zero and right knee = 23 deg from zero  Goal status: Progressing  5. Pt. Will demo ability to stand pivot transfer with max assist for improved functional mobility and less dependent need on mechanical device.   Baseline: Dependent on hoyer lift for all transfers; 04/07/2022- Unable to test secondary to patient with recent UTI and right hand procedure - will attempt to  reassess next visit. ; 04/12/22: Unable to successfully stand due to feet plates on loaned power w/c; 05/10/2022- Patient still having to use rental chair and has not received her original power w/c back to practice SPT. 07/04/2021= Patient just received new power w/c and attempted standing from 1st time today- Patient able to stand with max assist today from w/c and did not attempt to perform pivot transfer- difficulty with static standing and placing weight on right LE.   Goal status: ONGOING  6. Pt. Will demonstrate improved functional LE strength as seen by ability to stand > 2 min for improved transfer ability and pregait abilities.   Baseline: not assessed today due to recent dx: UTI; 04/07/2022- Unable to test secondary to patient with recent UTI and right hand procedure - will attempt to reassess next visit; 04/12/22: Unable to successfully stand due to feet plates on loaned power w/c4/09/2022- Patient still having to use rental chair and has not received her original power w/c back to practice Standing. 07/05/2022- Patient able to stand with max assist at trunk today for 2 min 3 sec. Will keep goal active to ensure consistency.   Goal status: ONGOING  ASSESSMENT:  CLINICAL IMPRESSION: Patient presents without caregiver in gym today so decided to perform more LE strengthening in chair for safety. She performed well- continues to use her right LE more- able to feel more manual pressure right leg press than before and able to lift Right LE off footplate several times during session. Will attempt standing if caregiver available for assist. Pt will continue to benefit from skilled PT services to optimize independence and reduced caregiver support.    OBJECTIVE IMPAIRMENTS Abnormal gait, decreased activity tolerance, decreased balance, decreased coordination, decreased endurance, decreased mobility, difficulty walking, decreased ROM, decreased strength, hypomobility, impaired flexibility, impaired UE functional  use, postural dysfunction, and pain.   ACTIVITY LIMITATIONS carrying, lifting, bending, sitting, standing, squatting, sleeping, stairs, transfers, bed mobility, continence, bathing, toileting, dressing, self feeding, reach over head, hygiene/grooming, and caring for others  PARTICIPATION LIMITATIONS: meal prep, cleaning, laundry, medication management, personal finances, interpersonal relationship, driving, shopping, community activity, and yard work  PERSONAL FACTORS Age, Time since onset of injury/illness/exacerbation, and 1-2 comorbidities: HTN, cervical Sx  are also affecting patient's functional outcome.  REHAB POTENTIAL: Good  CLINICAL DECISION MAKING: Evolving/moderate complexity  EVALUATION COMPLEXITY: Moderate  PLAN: PT FREQUENCY: 1-2x/week  PT DURATION: 12 weeks  PLANNED INTERVENTIONS: Therapeutic exercises, Therapeutic activity, Neuromuscular re-education, Balance training, Gait training, Patient/Family education, Self Care, Joint mobilization, DME instructions, Dry Needling, Electrical stimulation, Wheelchair mobility training, Spinal mobilization, Cryotherapy, Moist heat, and Manual therapy  PLAN FOR NEXT SESSION: Continue with weight bearing activities, hoyer over to mat table and practice standing next visit and LE strengthening as appropriate.   Louis Meckel, PT Physical Therapist- [facility]  07/07/2022, 1:21 PM

## 2022-07-07 NOTE — Therapy (Signed)
Occupational Therapy Treatment/Recertification Note      Patient Name: Sherry [name] MRN: [id number] DOB:[birth year], 88 y.o., female Today's Date: 07/07/2022                                         PCP: Dr. Tillman Abide REFERRING PROVIDER: Dr. Tillman Abide   OT End of Session - 07/07/22 1329     Visit Number [id number]    Number of Visits 72    Date for OT Re-Evaluation 09/27/22    OT Start Time 1100    OT Stop Time 1145    OT Time Calculation (min) 45 min    Equipment Utilized During Treatment moist heat    Activity Tolerance Patient tolerated treatment well    Behavior During Therapy WFL for tasks assessed/performed                  Past Medical History:  Diagnosis Date   Acute blood loss anemia    Arthritis    Cancer (HCC)    skin   Central cord syndrome at C6 level of cervical spinal cord (HCC) 11/29/2017   Hypertension    Protein-calorie malnutrition, severe (HCC) 01/24/2018   S/P insertion of IVC (inferior vena caval) filter 01/24/2018   Tetraparesis (HCC)    Past Surgical History:  Procedure Laterality Date   ANTERIOR CERVICAL DECOMP/DISCECTOMY FUSION N/A 11/29/2017   Procedure: Cervical five-six, six-seven Anterior Cervical Decompression Fusion;  Surgeon: Jadene Pierini, MD;  Location: [facility] OR;  Service: Neurosurgery;  Laterality: N/A;  Cervical five-six, six-seven Anterior Cervical Decompression Fusion   CATARACT EXTRACTION     FEMUR IM NAIL Left 08/16/2021   Procedure: INTRAMEDULLARY (IM) NAIL FEMORAL;  Surgeon: Deeann Saint, MD;  Location: [facility] ORS;  Service: Orthopedics;  Laterality: Left;   IR IVC FILTER PLMT / S&I /IMG GUID/MOD SED  12/08/2017   Patient Active Problem List   Diagnosis Date Noted   Hypertensive heart disease with other congestive heart failure (HCC) 02/14/2022   Chronic indwelling Foley catheter 02/14/2022   Closed hip fracture (HCC) 08/15/2021   DVT (deep venous thrombosis) (HCC) 08/15/2021   Quadriplegia (HCC) 08/15/2021    Fall 08/15/2021   Constipation due to slow transit 08/31/2018   Trauma 06/05/2018   Neuropathic pain 06/05/2018   Neurogenic bowel 06/05/2018   Vaginal yeast infection 01/30/2018   Healthcare-associated pneumonia 01/25/2018   Chronic allergic rhinitis 01/24/2018   Depression with anxiety 01/24/2018   UTI due to Klebsiella species 01/24/2018   Protein-calorie malnutrition, severe (HCC) 01/24/2018   S/P insertion of IVC (inferior vena caval) filter 01/24/2018   Tetraparesis (HCC) 01/20/2018   Neurogenic bladder 01/20/2018   Reactive depression    Benign essential HTN    Acute postoperative anemia due to expected blood loss    Central cord syndrome at C6 level of cervical spinal cord (HCC) 11/29/2017   Allergy to alpha-gal 11/25/2016   SCC (squamous cell carcinoma) 04/08/2014    REFERRING DIAG: Central Cord Syndrome at C6 of the Cervical Spinal Cord, Fall with Bilateral Closed Hip Fractures, with ORIF repair with Intramedullary Nailing of the Left Hip Fracture.  THERAPY DIAG:  Muscle weakness (generalized)  Rationale for Evaluation and Treatment Rehabilitation  PERTINENT HISTORY: Patient s/p fall November 29, 2017 resulting in diagnosis of central cord syndrome at C 6 level.  She has had therapy in multiple venues but with recent move to  [facility], therapy staff recommended she seek outpatient therapy for her needs. Pt. Had a recent fall with Bilateral Closed Hip Fractures, with ORIF repair with Intramedullary Nailing of the Left Hip Fracture.   PRECAUTIONS: None  SUBJECTIVE:   Pt.  Reports having an appointment for a facial this afternoon.  PAIN:  Are you having pain? No  OBJECTIVE:    Today's Treatment:  Manual Therapy:   Pt. tolerated soft tissue massage to the volar, and dorsal surface of each digit on the bilateral hands, and thenar eminence secondary to stiffness following moist heat modality. Soft tissue mobilization was performed for Carpal, and metacarpal spread  stretches. Manual therapy was performed independent of, and in preparation for therapeutic Ex.   Therapeutic Ex.    Pt. tolerated PROM followed by AROM bilateral wrist extension, PROM for bilateral digit MP, PIP, and DIP flexion, and extension, thumb radial, and palmar abduction. Emphasis was placed on left wrist extension with moist heat modality. Pt. Worked on BUE strengthening, and reciprocal motion using the UBE while seated for 5 min. with no resistance. Constant monitoring was provided.      PATIENT EDUCATION: RUE Distal ROM, and strength Person educated: Patient Education method: Explanation and Demonstration Education comprehension: verbalized understanding, returned demonstration, and needs further education   HOME EXERCISE PROGRAM Continue with ongoing HEPs  Measurements:  12/09/2021:  Shoulder flexion: R: 120(136), L: 138(150) Shoulder abduction: R: 108(120), L: 110(120) Elbow: R: 0-148, L: 0-146 Wrist flexion: R: 55(62), L: 78 Writ extension: R: 34(55), L: 3(4) RD: R: 12(20), L: 16(26) UD: R: 8(24), L: 6(18) Thumb radial abduction: R: 11(20), L: 8(20) Digit flexion to the [facility]: R:  2nd: 5cm(3.5cm), 3rd: 7.5 cm(5cm), 4th: 7cm(3cm), 5th: 6cm(5.5cm) L: 2nd: 8cm(8cm), 3rd: 8cm(8cm), 4th: 7.5cm(7cm), 5th: 7cm(7cm)  02/03/2022:   Shoulder flexion: R: 122(138), L: 148(154) Shoulder abduction: R: 109(125), L: 125(125 Elbow: R: 0-148, L: 0-146 Wrist flexion: R: 60(68), L: 79 Writ extension: R: 40(55), L: 3(10) RD: R: 14(24), L: 20(28) UD: R: 8(24), L: 6(18) Thumb radial abduction: R: 20(25), L: 8(20) Digit flexion to the [facility]: R:  2nd: 4.5cm(3cm), 3rd: 6.5cm(4.5cm), 4th: 6.5 cm(3cm), 5th: 4.5cm(5cm) L: 2nd: 8cm(8cm), 3rd: 8cm(7cm), 4th: 7.5cm(7cm), 5th: 6.5cm(6.5cm)   04/07/2022:   Shoulder flexion: R: 125(150), L: 160(160) Shoulder abduction: R: 109(125), L: 125(125 Elbow: R: 0-148, L: 0-146 Wrist flexion: R: 60(68), L: 79 Writ extension: R: 40(55), L: 5(18) RD:  R: 14(24), L: 20(28) UD: R: 18(24), L: 8(18) Thumb radial abduction: R: 20(25), L: 8(20) Digit flexion to the [facility]: R:  2nd: 4.5 cm(4 cm), 3rd: 6.5cm(3cm), 4th: 7 cm (5cm), 5th: 5cm(5cm) L: 2nd: 8cm(8cm), 3rd: 8cm(7cm), 4th: 7.5cm(7cm), 5th: 7cm(7cm)   07/05/2022:   Shoulder flexion: R: 125(154), L: 160(160) Shoulder abduction: R: 95(130), L: 143(143) Elbow: R: 0-148, L: 0-146 Wrist flexion: R: 60(68), L: 79 Writ extension: R: 40(60), L: 0(12) RD: R: 10(24), L: 12(20) UD: R: 16(24), L: 8(16) Thumb radial abduction: R: 20(25), L: 8(20) Digit flexion to the [facility]: R:  2nd: 4 cm(4cm), 3rd: 6.5cm( 3.5cm), 4th: 7 cm (5cm), 5th: 6cm(5cm) L: 2nd: 8cm(8cm), 3rd: 8cm(7cm), 4th: 8cm(7cm), 5th: 7cm(7cm)            OT Long Term Goals - 07/06/21 1106  Target Date:  09/27/2022      OT LONG TERM GOAL #1   Title Patient and caregiver will demonstrate understanding of home exercise program for ROM.    Baseline 12/09/2021: Pt. Reports the restorative ROM program has not resumed at the facility. 10/28/2021: Pt. Is attempting exercises/stretches at home. The facility restorative aides have not resumed exercises. 09/14/2021:  Pt. Reports inconsistencies with restorative ROM at  her care home. Pt/caregiver education to be provided about ongoing ROM needs.    Time 12    Period Weeks    Status Deferred     OT LONG TERM GOAL #2   Title Pt.  will improve BUE strength to be able to sustain her UE's in elevation to efficiently, and effectively perform hair care, and self-grooming tasks.   Baseline 02/03/2022: Pt. has improved, and is now able to sustain her Bilateral UEs in elevation long enough to efficiently, and effectively perform haircare, and self-grooming tasks. 12/09/2021: right shoulder 4-/5, left shoulder 4/5. Pt. has improved to be able to reach up for hair care, and self-grooming tasks. 10/28/2021: Right shoulder  3+/5, Left shoulder 4-/5 09/14/2021:  BUE shoulder strength: 3+/5 for shoulder flexion, and abduction.   Time 12    Period Weeks    Status Achieved               OT LONG TERM GOAL #3   Title Patient will demonstrate improved composite finger flexion to be able to firmly hold  and use adaptive devices during ADLs, and IADLs.   Baseline 07/05/2022: Pt. presents with digit MP, PIP, and DIP extensor tightness limiting her ability to securely grip objects in her bilateral hands.  04/07/2022: Pt. Has improved with right 2nd, and 3rd digit flexion towards the [facility]. Pt. Continues to have difficulty securely holding and applying deodorant.02/03/2022: Pt. Has improved with digit flexion, however continues to have difficulty securely holding and using deoderant. 12/09/2021: Bilateral hand/digit MP, PIP, and DIP extension tightness limits her ability achieve digit flexion to hold, and apply deodorant. 10/28/2021:  Pt. continues to have difficulty holding the deodorant. 01/12/2021: Pt. Presents with limited digit extension. Pt. Is able to initiate holding deodorant, however is unable to hold it while using it.   Time 12    Period Weeks    Status Revised   Target Date                  OT LONG TERM GOAL #4   Title Pt. Will improve bilateral wrist extension in preparation for anticipating, and initiating reaching for objects at the table.    Baseline 07/05/2022: Right: 40(60) Left: 0(12) 04/07/2022: Rght: 40(55) Left: 5(18) 02/03/2022: Right: 34(55) Left: 3(4) 12/09/2021: Right  34(55)  Left  3(4) 10/28/2021:  Right 22(38), Left 0(15) 09/14/2021:  Right: 17(35), left 2(15)   Time 12   Period weeks   Status Ongoing   Target Date      OT LONG TERM GOAL #5   Title Pt. will write, and sign her name with 100% legibility, and modified independence.    Baseline 6/03/204: Pt. Is consistently filling out  daily meal menus, and puzzles. 04/07/2022: Pt. Reports that she continues to present with shaky appearing writing.  Pt.reports that she is writing more when filling out her menu, and complete puzzles 02/03/2022: Pt. Continues to report legibility appears "shaky" when attempting signing her name. Pt.reports that she is writing more when filling out her menu, and complete puzzles.12/09/2021: Pt. Reports legibility appears "shaky" when  attempting signing her name. Pt.reports that she is writing more when filling out her menu. 10/28/2021: pt. is able to sign her name, however legibility continues to be inconsistent. Pt. Reports "shaky appearance". 09/14/2021: Pt. continues to fill out her daily menu and complete puzzles. Pt. Continues to present with limited writing legibility.   Time 12    Period Weeks    Status  Ongoing         OT LONG TERM GOAL  #6   TITLE Pt. will turn pages in a book with modified independence.    Baseline 02/03/2022: Pt. Is able to effectively turn pages of a book. 10/28/2021: Pt. Is able to consistently turn book pages. 09/14/2021: Pt. has difficulty turning pages.    Time 12    Period Weeks    Status Achieved       OT LONG TERM GOAL  #7  TITLE Pt. Will increase  bilateral lateral pinch strength by 2 lbs to be able to securely grasp items during ADLs, and IADL tasks.   Baseline 07/05/2022: TBD 07/2022: NT-Pinch meter out for calibration. 02/03/2022: Right: 6#, Left: 4# 12/09/2021:  Right: 6#, Left: 4#   Time 12  Period Weeks  Status Ongoing           OT LONG TERM GOAL  #8  TITLE Pt. Will button a sweater with large 1" buttons, and zip her jacket with modified independence.   Baseline 06/07/2022: Pt. is able to use a button hook to button a sweater. Pt. Reports that she does not wear button down clothing often.  04/07/2022: Pt. Has difficulty manipulating buttons, however was introduced to a buttonhook. Pt. Was able to hold, and use a buttonhook for buttons on a shirt. 02/03/2022: Pt. is unable to manipulate buttons on her sweater, or zippers on her jacket.  Time 12  Period Weeks  Status  Achieved   OT LONG TERM GOAL  #9  TITLE Pt. will complete plant care with modified independence.  Baseline 07/05/2022: pt. Is able to water, and care for some  of her plants. Pt. Has more difficulty with plants that are harder to reach. 04/07/2022: Pt. is now able to hold a cup and water her plants.02/03/2022: Pt. has difficulty caring for her plants.   Time 12  Period Weeks  Status Ongoing   OT LONG TERM GOAL  #10  TITLE Pt. will demonstrate adaptive techniques to assist with the efficiency of self-dressing, or morning care tasks.  Baseline 07/05/2022: Continue with goal.  04/07/2022: Pt. Continues to require assist with the efficiency of self-dressing, and morning care tasks.02/03/2022: Pt. requires assist from caregivers 2/2 time limitations during morning care.   Time 12  Period Weeks  Status Ongoing   Title Pt.  will improve BUE strength to improve ADL, and IADL functioning.  Baseline 07/05/2022: shoulder flexion: right 4-/5, abduction: 3+/5, elbow flexion: right: 5/5, left 5/5, extension: right: 4-/5, left 4-/5, wrist extension: right: 3-/5, left: 2-/5  Time 12   Period Weeks   Status New     Plan - 07/22/21 1737     Clinical Impression Statement Pt. Is tolerating Manual therapy, and ROM well. Pt. progress update was performed last session. Goals were reviewed, and updated last session with new goals added for  improving BUE strengthening for improved ADL,a nd IADL functioning.  Pt. continues to present with tightness at the bilateral thumb webspace, and bilateral wrist flexor tightness, as well as digit MP, PIP, and DIP extensor tightness which continues to  limit her ability to complete ADL tasks efficiently. Pt. continues to benefit from working on impoving ROM in order to work towards increasing bilateral hand grasp on objects, and increase engagement of bilateral hands during ADLs, and IADL tasks.                    OT Occupational Profile and History Detailed Assessment- Review of  Records and additional review of physical, cognitive, psychosocial history related to current functional performance    Occupational performance deficits (Please refer to evaluation for details): ADL's;IADL's;Leisure;Social Participation    Body Structure / Function / Physical Skills ADL;Continence;Dexterity;Flexibility;Strength;ROM;Balance;Coordination;FMC;IADL;Endurance;UE functional use;Decreased knowledge of use of DME;[facility]    Psychosocial Skills Coping Strategies    Rehab Potential Fair    Clinical Decision Making Several treatment options, min-mod task modification necessary    Comorbidities Affecting Occupational Performance: Presence of comorbidities impacting occupational performance    Comorbidities impacting occupational performance description: contractures of bilateral hands, dependent transfers,    Modification or Assistance to Complete Evaluation  No modification of tasks or assist necessary to complete eval    OT Frequency 2x / week    OT Duration 12 weeks    OT Treatment/Interventions Self-care/ADL training;Cryotherapy;Therapeutic exercise;DME and/or AE instruction;Balance training;Neuromuscular education;Manual Therapy;Splinting;Moist Heat;Passive range of motion;Therapeutic activities;Patient/family education    Plan continue to progress ROM of digits, wrists and shoulders as it pertains to completion of ADL tasks that pt values.    Consulted and Agree with Plan of Care Patient            Olegario Messier, MS, OTR/L  07/05/2022       `

## 2022-07-08 ENCOUNTER — Ambulatory Visit: Payer: Medicare HMO | Admitting: Occupational Therapy

## 2022-07-12 ENCOUNTER — Ambulatory Visit: Payer: Medicare HMO | Admitting: Occupational Therapy

## 2022-07-12 ENCOUNTER — Encounter: Payer: Self-pay | Admitting: Occupational Therapy

## 2022-07-12 ENCOUNTER — Ambulatory Visit: Payer: Medicare HMO

## 2022-07-12 DIAGNOSIS — M6281 Muscle weakness (generalized): Secondary | ICD-10-CM

## 2022-07-12 DIAGNOSIS — R278 Other lack of coordination: Secondary | ICD-10-CM

## 2022-07-12 DIAGNOSIS — R2681 Unsteadiness on feet: Secondary | ICD-10-CM

## 2022-07-12 DIAGNOSIS — R262 Difficulty in walking, not elsewhere classified: Secondary | ICD-10-CM

## 2022-07-12 DIAGNOSIS — S72001A Fracture of unspecified part of neck of right femur, initial encounter for closed fracture: Secondary | ICD-10-CM

## 2022-07-12 DIAGNOSIS — R2689 Other abnormalities of gait and mobility: Secondary | ICD-10-CM

## 2022-07-12 DIAGNOSIS — S14129S Central cord syndrome at unspecified level of cervical spinal cord, sequela: Secondary | ICD-10-CM

## 2022-07-12 DIAGNOSIS — R269 Unspecified abnormalities of gait and mobility: Secondary | ICD-10-CM

## 2022-07-12 NOTE — Therapy (Signed)
OUTPATIENT PHYSICAL THERAPY NEURO TREATMENT   Patient Name: Sherry [name] MRN: [id number] DOB:March 21, 1934, 88 y.o., female Today's Date: 07/13/2022   PCP: Earnestine Mealing, MD REFERRING PROVIDER: Deeann Saint   PT End of Session - 07/12/22 1250     Visit Number 52    Number of Visits 74    Date for PT Re-Evaluation 09/27/22    Progress Note Due on Visit 60    PT Start Time 1146    PT Stop Time 1218    PT Time Calculation (min) 32 min    Activity Tolerance Patient tolerated treatment well    Behavior During Therapy [facility] for tasks assessed/performed                            Past Medical History:  Diagnosis Date   Acute blood loss anemia    Arthritis    Cancer (HCC)    skin   Central cord syndrome at C6 level of cervical spinal cord (HCC) 11/29/2017   Hypertension    Protein-calorie malnutrition, severe (HCC) 01/24/2018   S/P insertion of IVC (inferior vena caval) filter 01/24/2018   Tetraparesis (HCC)    Past Surgical History:  Procedure Laterality Date   ANTERIOR CERVICAL DECOMP/DISCECTOMY FUSION N/A 11/29/2017   Procedure: Cervical five-six, six-seven Anterior Cervical Decompression Fusion;  Surgeon: Jadene Pierini, MD;  Location: [facility] OR;  Service: Neurosurgery;  Laterality: N/A;  Cervical five-six, six-seven Anterior Cervical Decompression Fusion   CATARACT EXTRACTION     FEMUR IM NAIL Left 08/16/2021   Procedure: INTRAMEDULLARY (IM) NAIL FEMORAL;  Surgeon: Deeann Saint, MD;  Location: [facility] ORS;  Service: Orthopedics;  Laterality: Left;   IR IVC FILTER PLMT / S&I /IMG GUID/MOD SED  12/08/2017   Patient Active Problem List   Diagnosis Date Noted   Hypertensive heart disease with other congestive heart failure (HCC) 02/14/2022   Chronic indwelling Foley catheter 02/14/2022   Closed hip fracture (HCC) 08/15/2021   DVT (deep venous thrombosis) (HCC) 08/15/2021   Quadriplegia (HCC) 08/15/2021   Fall 08/15/2021   Constipation due to slow  transit 08/31/2018   Trauma 06/05/2018   Neuropathic pain 06/05/2018   Neurogenic bowel 06/05/2018   Vaginal yeast infection 01/30/2018   Healthcare-associated pneumonia 01/25/2018   Chronic allergic rhinitis 01/24/2018   Depression with anxiety 01/24/2018   UTI due to Klebsiella species 01/24/2018   Protein-calorie malnutrition, severe (HCC) 01/24/2018   S/P insertion of IVC (inferior vena caval) filter 01/24/2018   Tetraparesis (HCC) 01/20/2018   Neurogenic bladder 01/20/2018   Reactive depression    Benign essential HTN    Acute postoperative anemia due to expected blood loss    Central cord syndrome at C6 level of cervical spinal cord (HCC) 11/29/2017   Allergy to alpha-gal 11/25/2016   SCC (squamous cell carcinoma) 04/08/2014    ONSET DATE: 08/15/2021 (fall with B hip fx); Initial injury was in 2019- quadraplegia due to central cord syndrome of C6 secondary to neck fracture.   REFERRING DIAG: Bilateral Hip fx; Left Open- s/p Left IM nail femoral on 08/16/2021; Right closed  THERAPY DIAG:  Muscle weakness (generalized)  Other lack of coordination  Central cord syndrome, sequela (HCC)  Abnormality of gait and mobility  Difficulty in walking, not elsewhere classified  Closed fracture of both hips, initial encounter (HCC)  Other abnormalities of gait and mobility  Unsteadiness on feet  Rationale for Evaluation and Treatment Rehabilitation  SUBJECTIVE:   SUBJECTIVE STATEMENT:  Patient reports doing well but no caregiver in gym today so deferred standing. Agreeable to continue to work on LE strengthening.   Pt accompanied by:  Paid caregiver-   PERTINENT HISTORY: Patient was previous patient (see previous episode for specifics) She experienced a fall at home on 08/15/2021 and was in Hospital from 08/15/2021- 08/20/2021.  Most recent history per Dr. Shonna Chock on 08/20/2021.  Sherry [name] is a 88 y.o. female with medical history significant of quadriplegia due to  central cord syndrome of C6 secondary to neck fracture, hypertension, depression with anxiety, neurogenic bladder, bilateral DVT on Xarelto (s/p of IVC filter placement), who presents with fall and hip pain.   Patient has history of quadriplegia, and has been doing PT/OT with improvement of function. Pt has some use of both arms and can feed herself. She can walk a little bit with assistance. Pt accidentally fell at about 930 this morning after taking shower.  She injured her hips. Hospital Course:    Closed left hip fracture [facility]) s/p mechanical fall Closed right hip fracture (?old) Acute blood loss anemia --pt is s/p  INTRAMEDULLARY (IM) NAIL FEMORAL-left on 7/16  PAIN:  Are you having pain? NO: NPRS scale: 0/10 Pain location: Right hip Pain description: Ache Aggravating factors: any active Movement of right side Relieving factors: Rest  PRECAUTIONS: Fall  WEIGHT BEARING RESTRICTIONS Yes Bilateral non-weight bearing B LE's  FALLS: Has patient fallen in last 6 months? Yes. Number of falls 1  LIVING ENVIRONMENT: Lives with: lives with their family and lives in a nursing home Lives in: Other Long term care at [facility] Stairs: No Has following equipment at home: Wheelchair (power), Ramped entry, and hoyer lift  PLOF: Needs assistance with ADLs, Needs assistance with homemaking, Needs assistance with gait, Needs assistance with transfers, and Leisure: Work puzzles, read, Watch TV  PATIENT GOALS Patient goal is to walk; Be able to strengthen my arms for better use  OBJECTIVE:   DIAGNOSTIC FINDINGS: Overall cognitive statusEXAM: DG HIP (WITH OR WITHOUT PELVIS) 2-3V LEFT; DG HIP (WITH OR WITHOUT PELVIS) 2-3V RIGHT   COMPARISON:  CT of the abdomen and pelvis 01/21/2020   FINDINGS: Left proximal femur fracture noted just below the intertrochanteric region. Marked ventral angulation noted on the cross-table image. Slight varus angulation present   Comminuted intratrochanteric  fracture present on the right also with marked ventral angulation.   IMPRESSION: 1. Bilateral hip fractures. 2. Comminuted intertrochanteric fracture of the right hip. 3. Angulated fracture of the proximal left femur just below the intertrochanteric region.     Electronically Signed   By: Marin Roberts M.D.   On: 08/15/2021 11:54:   COGNITION:  Within functional limits for tasks assessed   SENSATION: Light touch: Impaired   EDEMA:  None  Strength R/L 2-/2- Shoulder flexion (anterior deltoid/pec major/coracobrachialis, axillary n. (C5-6) and musculocutaneous n. (C5-7)) 2-/3+ (within available ROM around 130 on lef)Shoulder abduction (deltoid/supraspinatus, axillary/suprascapular n, C5) 3+/3+ Shoulder external rotation (infraspinatus/teres minor) 4/4 Shoulder internal rotation (subcapularis/lats/pec major) 3+/3+ Shoulder extension (posterior deltoid, lats, teres major, axillary/thoracodorsal n.) 3+/3+ Shoulder horizontal abduction 4/4 Elbow flexion (biceps brachii, brachialis, brachioradialis, musculoskeletal n, C5-6) 4/4 Elbow extension (triceps, radial n, C7) 2-/2- Wrist Extension 3+/3+ Wrist Flexion   LOWER EXTREMITY MMT:    MMT Right 01/11/22 Left 01/11/22  Hip flexion 2- 2-  Hip extension 2- 2-  Hip abduction 2- 2-  Hip adduction 2- 3  Hip internal rotation 2- 2-  Hip external rotation 2-  2-  Knee flexion 2- 2-  Knee extension 2+ 3+  Ankle dorsiflexion 2- NT - boot  Ankle plantarflexion 2- NT- boot  Ankle inversion 2- NT- boot  Ankle eversion 2- NT- boot  (Blank rows = not tested)  BED MOBILITY:  Unable to test due to NWB status of BLE- Patient currently dependent with all bed mobility using hoyer and caregiver assist  TRANSFERS: Currently BLE NWB- Dependent on use of hoyer for all transfers  FUNCTIONAL TESTs:   PATIENT SURVEYS:  FOTO 12  TODAY'S TREATMENT:    THEREX: (all exercises performed in power wheelchair) -  PROM to right and  left knee flex/ext x 5 min-  AROM Right knee ext and  AAROM R hip flex 2 x 15 reps AROM Left hip flex 2 x 15 reps each.  Resistive Left Knee ext with 4# AW- 2 sets of 15 reps Active Left hip abd/add with 4# AW - 2 sets of 15 reps AAROM right hip abd/add - 2 sets of 15 reps Manual resistive Leg press in reclined position with 2 sets of 15 reps       Pt educated throughout session about proper posture and technique with exercises. Improved exercise technique, movement at target joints, use of target muscles after min to mod verbal, visual, tactile cues.   PATIENT EDUCATION: Education details: PT plan of care/use of resistive bands Person educated: Patient Education method: Explanation, Demonstration, Tactile cues, and Verbal cues Education comprehension: verbalized understanding, returned demonstration, verbal cues required, tactile cues required, and needs further education   HOME EXERCISE PROGRAM: No changes at this time  Access Code: [facility] URL: [email or web address] Date: 11/23/2021 Prepared by: Precious Bard Exercises - Seated Gluteal Sets - 2 x daily - 7 x weekly - 2 sets - 10 reps - 5 hold - Seated Quad Set - 2 x daily - 7 x weekly - 2 sets - 10 reps - 5 hold - Seated Long Arc Quad - 2 x daily - 7 x weekly - 2 sets - 10 reps - 5 hold - Seated March - 2 x daily - 7 x weekly - 2 sets - 10 reps - 5 hold - Seated Hip Abduction - 2 x daily - 7 x weekly - 2 sets - 10 reps - 5 hold - Seated Shoulder Shrugs - 2 x daily - 7 x weekly - 2 sets - 10 reps - 5 hold - Wheelchair Pressure Relief - 2 x daily - 7 x weekly - 2 sets - 10 reps - 5 hold  GOALS: Goals reviewed with patient? Yes  SHORT TERM GOALS: Target date: 02/22/2022  Pt will be independent with initial UE strengthening HEP in order to improve strength and balance in order to decrease fall risk and improve function at home and work. Baseline: 10/19/2021- patient with no formal UE HEP; 12/02/2021= Patient verbalized  knowledge of HEP including use of theraband for UE strengthening.  Goal status: GOAL MET  LONG TERM GOALS: Target date: 09/27/2022  Pt will be independent with final for UE/LE HEP in order to improve strength and balance in order to decrease fall risk and improve function at home and work. Baseline: Patient is currently BLE NWB and unable to participate in HEP. Has order for UE strengthening. 01/11/2022- Patient now able to participate in LE strengthening although NWB still- good understanding for some basic exercises. Will keep goal active to incorporate progressive LE strengthening exercises. 04/07/2022= Patient still participating in progressive LE seated HEP and has  no questions at this time.  Goal status: MET  2.  Pt will improve FOTO to target score of 35  to display perceived improvements in ability to complete ADL's.  Baseline: 10/19/2021= 12; 12/02/2021= 17; 01/11/2022=17; 04/07/2022= 17; 07/05/2022=17 Goal status: Ongoing  3.  Pt will increase strength of B UE  by at least 1/2 MMT grade in order to demonstrate improvement in strength and function  Baseline: patient has range of 2-/5 to 4/5 BUE Strength; 12/02/2021= 4/5 except with wrist ext. 01/11/2022= 4/5 B UE strength expept for wrist Goal status: Goal revised-no longer appropriate- working with OT on all UE strengthening.   4. Pt. Will increase strength of RLE by at least 1/2 MMT grade in order to demonstrate improvement in Standing/transfers.  Baseline: 2-/5 Right hip flex/knee ext/flex; 04/07/2022= 2- with right hip flex/knee ext (lacking 28 deg from zero) 4/8= Patient able to ext right knee lacking 18 deg from zero. 07/05/2022=Left knee approx 8 deg from zero and right knee = 23 deg from zero  Goal status: Progressing  5. Pt. Will demo ability to stand pivot transfer with max assist for improved functional mobility and less dependent need on mechanical device.   Baseline: Dependent on hoyer lift for all transfers; 04/07/2022- Unable to test  secondary to patient with recent UTI and right hand procedure - will attempt to reassess next visit. ; 04/12/22: Unable to successfully stand due to feet plates on loaned power w/c; 05/10/2022- Patient still having to use rental chair and has not received her original power w/c back to practice SPT. 07/04/2021= Patient just received new power w/c and attempted standing from 1st time today- Patient able to stand with max assist today from w/c and did not attempt to perform pivot transfer- difficulty with static standing and placing weight on right LE.   Goal status: ONGOING  6. Pt. Will demonstrate improved functional LE strength as seen by ability to stand > 2 min for improved transfer ability and pregait abilities.   Baseline: not assessed today due to recent dx: UTI; 04/07/2022- Unable to test secondary to patient with recent UTI and right hand procedure - will attempt to reassess next visit; 04/12/22: Unable to successfully stand due to feet plates on loaned power w/c4/09/2022- Patient still having to use rental chair and has not received her original power w/c back to practice Standing. 07/05/2022- Patient able to stand with max assist at trunk today for 2 min 3 sec. Will keep goal active to ensure consistency.   Goal status: ONGOING  ASSESSMENT:  CLINICAL IMPRESSION: Patient presents again without caregiver in gym today so treatment focused on LE strengthening in chair for safety. She continued to demo progress either by volume/increased resistance or less physical assist. She presented with near full Right knee ext ROM and improving with more pressure felt by author with manual leg press. Pt will continue to benefit from skilled PT services to optimize independence and reduced caregiver support.    OBJECTIVE IMPAIRMENTS Abnormal gait, decreased activity tolerance, decreased balance, decreased coordination, decreased endurance, decreased mobility, difficulty walking, decreased ROM, decreased strength,  hypomobility, impaired flexibility, impaired UE functional use, postural dysfunction, and pain.   ACTIVITY LIMITATIONS carrying, lifting, bending, sitting, standing, squatting, sleeping, stairs, transfers, bed mobility, continence, bathing, toileting, dressing, self feeding, reach over head, hygiene/grooming, and caring for others  PARTICIPATION LIMITATIONS: meal prep, cleaning, laundry, medication management, personal finances, interpersonal relationship, driving, shopping, community activity, and yard work  PERSONAL FACTORS Age, Time since onset of  injury/illness/exacerbation, and 1-2 comorbidities: HTN, cervical Sx  are also affecting patient's functional outcome.   REHAB POTENTIAL: Good  CLINICAL DECISION MAKING: Evolving/moderate complexity  EVALUATION COMPLEXITY: Moderate  PLAN: PT FREQUENCY: 1-2x/week  PT DURATION: 12 weeks  PLANNED INTERVENTIONS: Therapeutic exercises, Therapeutic activity, Neuromuscular re-education, Balance training, Gait training, Patient/Family education, Self Care, Joint mobilization, DME instructions, Dry Needling, Electrical stimulation, Wheelchair mobility training, Spinal mobilization, Cryotherapy, Moist heat, and Manual therapy  PLAN FOR NEXT SESSION: Continue with weight bearing activities, hoyer over to mat table and practice standing next visit and LE strengthening as appropriate.   Louis Meckel, PT Physical Therapist- [facility]  07/13/2022, 8:11 AM

## 2022-07-12 NOTE — Therapy (Signed)
Occupational Therapy Treatment/Recertification Note      Patient Name: Sherry [name] MRN: [id number] DOB:[birth year], 88 y.o., female Today's Date: 07/12/2022                                         PCP: Dr. Tillman Abide REFERRING PROVIDER: Dr. Tillman Abide   OT End of Session - 07/12/22 1109     Visit Number [id number]    Number of Visits 72    Date for OT Re-Evaluation 09/27/22    OT Start Time 1102    OT Stop Time 1145    OT Time Calculation (min) 43 min    Activity Tolerance Patient tolerated treatment well    Behavior During Therapy [facility] for tasks assessed/performed                  Past Medical History:  Diagnosis Date   Acute blood loss anemia    Arthritis    Cancer (HCC)    skin   Central cord syndrome at C6 level of cervical spinal cord (HCC) 11/29/2017   Hypertension    Protein-calorie malnutrition, severe (HCC) 01/24/2018   S/P insertion of IVC (inferior vena caval) filter 01/24/2018   Tetraparesis (HCC)    Past Surgical History:  Procedure Laterality Date   ANTERIOR CERVICAL DECOMP/DISCECTOMY FUSION N/A 11/29/2017   Procedure: Cervical five-six, six-seven Anterior Cervical Decompression Fusion;  Surgeon: Jadene Pierini, MD;  Location: [facility] OR;  Service: Neurosurgery;  Laterality: N/A;  Cervical five-six, six-seven Anterior Cervical Decompression Fusion   CATARACT EXTRACTION     FEMUR IM NAIL Left 08/16/2021   Procedure: INTRAMEDULLARY (IM) NAIL FEMORAL;  Surgeon: Deeann Saint, MD;  Location: [facility] ORS;  Service: Orthopedics;  Laterality: Left;   IR IVC FILTER PLMT / S&I /IMG GUID/MOD SED  12/08/2017   Patient Active Problem List   Diagnosis Date Noted   Hypertensive heart disease with other congestive heart failure (HCC) 02/14/2022   Chronic indwelling Foley catheter 02/14/2022   Closed hip fracture (HCC) 08/15/2021   DVT (deep venous thrombosis) (HCC) 08/15/2021   Quadriplegia (HCC) 08/15/2021   Fall 08/15/2021   Constipation due to slow transit  08/31/2018   Trauma 06/05/2018   Neuropathic pain 06/05/2018   Neurogenic bowel 06/05/2018   Vaginal yeast infection 01/30/2018   Healthcare-associated pneumonia 01/25/2018   Chronic allergic rhinitis 01/24/2018   Depression with anxiety 01/24/2018   UTI due to Klebsiella species 01/24/2018   Protein-calorie malnutrition, severe (HCC) 01/24/2018   S/P insertion of IVC (inferior vena caval) filter 01/24/2018   Tetraparesis (HCC) 01/20/2018   Neurogenic bladder 01/20/2018   Reactive depression    Benign essential HTN    Acute postoperative anemia due to expected blood loss    Central cord syndrome at C6 level of cervical spinal cord (HCC) 11/29/2017   Allergy to alpha-gal 11/25/2016   SCC (squamous cell carcinoma) 04/08/2014    REFERRING DIAG: Central Cord Syndrome at C6 of the Cervical Spinal Cord, Fall with Bilateral Closed Hip Fractures, with ORIF repair with Intramedullary Nailing of the Left Hip Fracture.  THERAPY DIAG:  Muscle weakness (generalized)  Other lack of coordination  Central cord syndrome, sequela (HCC)  Rationale for Evaluation and Treatment Rehabilitation  PERTINENT HISTORY: Patient s/p fall November 29, 2017 resulting in diagnosis of central cord syndrome at C 6 level.  She has had therapy in multiple venues but with recent  move to [facility], therapy staff recommended she seek outpatient therapy for her needs. Pt. Had a recent fall with Bilateral Closed Hip Fractures, with ORIF repair with Intramedullary Nailing of the Left Hip Fracture.   PRECAUTIONS: None  SUBJECTIVE:   Pt reports washing her face and combing her hair without assist.   PAIN:  Are you having pain? No  OBJECTIVE:    Today's Treatment:   Therapeutic Ex.    Pt. tolerated PROM followed by AROM bilateral wrist extension, PROM for bilateral digit MP, PIP, and DIP flexion, and extension, thumb radial, and palmar abduction. Tolerated 2 set x 15 reps each for B shoulder abduction, shoulder  flexion, horizontal flexion, and scapular retraction. Pt. Worked on BUE strengthening, and reciprocal motion using the UBE while seated for 6 min. with no resistance alternating forward and backward. Constant monitoring was provided.      PATIENT EDUCATION: RUE Distal ROM, and strength Person educated: Patient Education method: Explanation and Demonstration Education comprehension: verbalized understanding, returned demonstration, and needs further education   HOME EXERCISE PROGRAM Continue with ongoing HEPs  Measurements:  12/09/2021:  Shoulder flexion: R: 120(136), L: 138(150) Shoulder abduction: R: 108(120), L: 110(120) Elbow: R: 0-148, L: 0-146 Wrist flexion: R: 55(62), L: 78 Writ extension: R: 34(55), L: 3(4) RD: R: 12(20), L: 16(26) UD: R: 8(24), L: 6(18) Thumb radial abduction: R: 11(20), L: 8(20) Digit flexion to the [facility]: R:  2nd: 5cm(3.5cm), 3rd: 7.5 cm(5cm), 4th: 7cm(3cm), 5th: 6cm(5.5cm) L: 2nd: 8cm(8cm), 3rd: 8cm(8cm), 4th: 7.5cm(7cm), 5th: 7cm(7cm)  02/03/2022:   Shoulder flexion: R: 122(138), L: 148(154) Shoulder abduction: R: 109(125), L: 125(125 Elbow: R: 0-148, L: 0-146 Wrist flexion: R: 60(68), L: 79 Writ extension: R: 40(55), L: 3(10) RD: R: 14(24), L: 20(28) UD: R: 8(24), L: 6(18) Thumb radial abduction: R: 20(25), L: 8(20) Digit flexion to the [facility]: R:  2nd: 4.5cm(3cm), 3rd: 6.5cm(4.5cm), 4th: 6.5 cm(3cm), 5th: 4.5cm(5cm) L: 2nd: 8cm(8cm), 3rd: 8cm(7cm), 4th: 7.5cm(7cm), 5th: 6.5cm(6.5cm)   04/07/2022:   Shoulder flexion: R: 125(150), L: 160(160) Shoulder abduction: R: 109(125), L: 125(125 Elbow: R: 0-148, L: 0-146 Wrist flexion: R: 60(68), L: 79 Writ extension: R: 40(55), L: 5(18) RD: R: 14(24), L: 20(28) UD: R: 18(24), L: 8(18) Thumb radial abduction: R: 20(25), L: 8(20) Digit flexion to the [place]: R:  2nd: 4.5 cm(4 cm), 3rd: 6.5cm(3cm), 4th: 7 cm (5cm), 5th: 5cm(5cm) L: 2nd: 8cm(8cm), 3rd: 8cm(7cm), 4th: 7.5cm(7cm), 5th: 7cm(7cm)    07/05/2022:   Shoulder flexion: R: 125(154), L: 160(160) Shoulder abduction: R: 95(130), L: 143(143) Elbow: R: 0-148, L: 0-146 Wrist flexion: R: 60(68), L: 79 Writ extension: R: 40(60), L: 0(12) RD: R: 10(24), L: 12(20) UD: R: 16(24), L: 8(16) Thumb radial abduction: R: 20(25), L: 8(20) Digit flexion to the [facility]: R:  2nd: 4 cm(4cm), 3rd: 6.5cm( 3.5cm), 4th: 7 cm (5cm), 5th: 6cm(5cm) L: 2nd: 8cm(8cm), 3rd: 8cm(7cm), 4th: 8cm(7cm), 5th: 7cm(7cm)            OT Long Term Goals - 07/06/21 1106  Target Date:  09/27/2022      OT LONG TERM GOAL #1   Title Patient and caregiver will demonstrate understanding of home exercise program for ROM.    Baseline 12/09/2021: Pt. Reports the restorative ROM program has not resumed at the facility. 10/28/2021: Pt. Is attempting exercises/stretches at home. The facility restorative aides have not resumed exercises. 09/14/2021:  Pt. Reports inconsistencies with restorative ROM at  her care home. Pt/caregiver education to be provided about ongoing ROM needs.    Time 12    Period Weeks    Status Deferred     OT LONG TERM GOAL #2   Title Pt.  will improve BUE strength to be able to sustain her UE's in elevation to efficiently, and effectively perform hair care, and self-grooming tasks.   Baseline 02/03/2022: Pt. has improved, and is now able to sustain her Bilateral UEs in elevation long enough to efficiently, and effectively perform haircare, and self-grooming tasks. 12/09/2021: right shoulder 4-/5, left shoulder 4/5. Pt. has improved to be able to reach up for hair care, and self-grooming tasks. 10/28/2021: Right shoulder 3+/5, Left shoulder 4-/5 09/14/2021:  BUE shoulder strength: 3+/5 for shoulder flexion, and abduction.   Time 12    Period Weeks    Status Achieved               OT LONG TERM GOAL #3   Title Patient will demonstrate improved composite finger flexion  to be able to firmly hold  and use adaptive devices during ADLs, and IADLs.   Baseline 07/05/2022: Pt. presents with digit MP, PIP, and DIP extensor tightness limiting her ability to securely grip objects in her bilateral hands.  04/07/2022: Pt. Has improved with right 2nd, and 3rd digit flexion towards the [facility]. Pt. Continues to have difficulty securely holding and applying deodorant.02/03/2022: Pt. Has improved with digit flexion, however continues to have difficulty securely holding and using deoderant. 12/09/2021: Bilateral hand/digit MP, PIP, and DIP extension tightness limits her ability achieve digit flexion to hold, and apply deodorant. 10/28/2021:  Pt. continues to have difficulty holding the deodorant. 01/12/2021: Pt. Presents with limited digit extension. Pt. Is able to initiate holding deodorant, however is unable to hold it while using it.   Time 12    Period Weeks    Status Revised   Target Date                  OT LONG TERM GOAL #4   Title Pt. Will improve bilateral wrist extension in preparation for anticipating, and initiating reaching for objects at the table.    Baseline 07/05/2022: Right: 40(60) Left: 0(12) 04/07/2022: Rght: 40(55) Left: 5(18) 02/03/2022: Right: 34(55) Left: 3(4) 12/09/2021: Right  34(55)  Left  3(4) 10/28/2021:  Right 22(38), Left 0(15) 09/14/2021:  Right: 17(35), left 2(15)   Time 12   Period weeks   Status Ongoing   Target Date      OT LONG TERM GOAL #5   Title Pt. will write, and sign her name with 100% legibility, and modified independence.    Baseline 6/03/204: Pt. Is consistently filling out  daily meal menus, and puzzles. 04/07/2022: Pt. Reports that she continues to present with shaky appearing writing. Pt.reports that she is writing more when filling out her menu, and complete puzzles 02/03/2022: Pt. Continues to report legibility appears "shaky" when attempting signing her name. Pt.reports that she is writing more when filling out her menu, and complete  puzzles.12/09/2021: Pt. Reports legibility appears "shaky" when  attempting signing her name. Pt.reports that she is writing more when filling out her menu. 10/28/2021: pt. is able to sign her name, however legibility continues to be inconsistent. Pt. Reports "shaky appearance". 09/14/2021: Pt. continues to fill out her daily menu and complete puzzles. Pt. Continues to present with limited writing legibility.   Time 12    Period Weeks    Status  Ongoing         OT LONG TERM GOAL  #6   TITLE Pt. will turn pages in a book with modified independence.    Baseline 02/03/2022: Pt. Is able to effectively turn pages of a book. 10/28/2021: Pt. Is able to consistently turn book pages. 09/14/2021: Pt. has difficulty turning pages.    Time 12    Period Weeks    Status Achieved       OT LONG TERM GOAL  #7  TITLE Pt. Will increase  bilateral lateral pinch strength by 2 lbs to be able to securely grasp items during ADLs, and IADL tasks.   Baseline 07/05/2022: TBD 07/2022: NT-Pinch meter out for calibration. 02/03/2022: Right: 6#, Left: 4# 12/09/2021:  Right: 6#, Left: 4#   Time 12  Period Weeks  Status Ongoing           OT LONG TERM GOAL  #8  TITLE Pt. Will button a sweater with large 1" buttons, and zip her jacket with modified independence.   Baseline 06/07/2022: Pt. is able to use a button hook to button a sweater. Pt. Reports that she does not wear button down clothing often.  04/07/2022: Pt. Has difficulty manipulating buttons, however was introduced to a buttonhook. Pt. Was able to hold, and use a buttonhook for buttons on a shirt. 02/03/2022: Pt. is unable to manipulate buttons on her sweater, or zippers on her jacket.  Time 12  Period Weeks  Status Achieved   OT LONG TERM GOAL  #9  TITLE Pt. will complete plant care with modified independence.  Baseline 07/05/2022: pt. Is able to water, and care for some  of her plants. Pt. Has more difficulty with plants that are harder to reach. 04/07/2022: Pt. is  now able to hold a cup and water her plants.02/03/2022: Pt. has difficulty caring for her plants.   Time 12  Period Weeks  Status Ongoing   OT LONG TERM GOAL  #10  TITLE Pt. will demonstrate adaptive techniques to assist with the efficiency of self-dressing, or morning care tasks.  Baseline 07/05/2022: Continue with goal.  04/07/2022: Pt. Continues to require assist with the efficiency of self-dressing, and morning care tasks.02/03/2022: Pt. requires assist from caregivers 2/2 time limitations during morning care.   Time 12  Period Weeks  Status Ongoing   Title Pt.  will improve BUE strength to improve ADL, and IADL functioning.  Baseline 07/05/2022: shoulder flexion: right 4-/5, abduction: 3+/5, elbow flexion: right: 5/5, left 5/5, extension: right: 4-/5, left 4-/5, wrist extension: right: 3-/5, left: 2-/5  Time 12   Period Weeks   Status New     Plan - 07/22/21 1737     Clinical Impression Statement  Tolerates AROM and PROM well, no pain. No heat used this date; utilized UBE for strengthening and warm up prior to ROM. Pt. continues to present with tightness at the bilateral thumb webspace, and bilateral wrist flexor tightness, as well as digit MP, PIP, and DIP extensor tightness which continues to limit her ability to complete ADL tasks efficiently. Pt. continues to benefit from working on  impoving ROM in order to work towards increasing bilateral hand grasp on objects, and increase engagement of bilateral hands during ADLs, and IADL tasks.        OT Occupational Profile and History Detailed Assessment- Review of Records and additional review of physical, cognitive, psychosocial history related to current functional performance    Occupational performance deficits (Please refer to evaluation for details): ADL's;IADL's;Leisure;Social Participation    Body Structure / Function / Physical Skills ADL;Continence;Dexterity;Flexibility;Strength;ROM;Balance;Coordination;FMC;IADL;Endurance;UE functional  use;Decreased knowledge of use of DME;[facility]    Psychosocial Skills Coping Strategies    Rehab Potential Fair    Clinical Decision Making Several treatment options, min-mod task modification necessary    Comorbidities Affecting Occupational Performance: Presence of comorbidities impacting occupational performance    Comorbidities impacting occupational performance description: contractures of bilateral hands, dependent transfers,    Modification or Assistance to Complete Evaluation  No modification of tasks or assist necessary to complete eval    OT Frequency 2x / week    OT Duration 12 weeks    OT Treatment/Interventions Self-care/ADL training;Cryotherapy;Therapeutic exercise;DME and/or AE instruction;Balance training;Neuromuscular education;Manual Therapy;Splinting;Moist Heat;Passive range of motion;Therapeutic activities;Patient/family education    Plan continue to progress ROM of digits, wrists and shoulders as it pertains to completion of ADL tasks that pt values.    Consulted and Agree with Plan of Care Patient             Kathie Dike, M.S. OTR/L  07/12/22, 11:10 AM  ascom [phone]

## 2022-07-14 ENCOUNTER — Encounter: Payer: Self-pay | Admitting: Occupational Therapy

## 2022-07-14 ENCOUNTER — Ambulatory Visit: Payer: Medicare HMO

## 2022-07-14 ENCOUNTER — Ambulatory Visit: Payer: Medicare HMO | Admitting: Occupational Therapy

## 2022-07-14 DIAGNOSIS — R2689 Other abnormalities of gait and mobility: Secondary | ICD-10-CM

## 2022-07-14 DIAGNOSIS — S72001A Fracture of unspecified part of neck of right femur, initial encounter for closed fracture: Secondary | ICD-10-CM

## 2022-07-14 DIAGNOSIS — M6281 Muscle weakness (generalized): Secondary | ICD-10-CM

## 2022-07-14 DIAGNOSIS — R269 Unspecified abnormalities of gait and mobility: Secondary | ICD-10-CM

## 2022-07-14 DIAGNOSIS — R2681 Unsteadiness on feet: Secondary | ICD-10-CM

## 2022-07-14 DIAGNOSIS — R262 Difficulty in walking, not elsewhere classified: Secondary | ICD-10-CM

## 2022-07-14 DIAGNOSIS — R278 Other lack of coordination: Secondary | ICD-10-CM

## 2022-07-14 DIAGNOSIS — S14129S Central cord syndrome at unspecified level of cervical spinal cord, sequela: Secondary | ICD-10-CM

## 2022-07-14 NOTE — Therapy (Signed)
Occupational Therapy Treatment Note      Patient Name: Sherry [name] MRN: [id number] DOB:[birth year], 88 y.o., female Today's Date: 07/14/2022                                         PCP: Dr. Tillman Abide REFERRING PROVIDER: Dr. Tillman Abide   OT End of Session - 07/14/22 1151     Visit Number [id number]    Number of Visits 72    Date for OT Re-Evaluation 09/27/22    Authorization Time Period Progress report period starting 12/14/2021    OT Start Time 1100    OT Stop Time 1145    OT Time Calculation (min) 45 min    Equipment Utilized During Treatment moist heat    Activity Tolerance Patient tolerated treatment well    Behavior During Therapy WFL for tasks assessed/performed                  Past Medical History:  Diagnosis Date   Acute blood loss anemia    Arthritis    Cancer (HCC)    skin   Central cord syndrome at C6 level of cervical spinal cord (HCC) 11/29/2017   Hypertension    Protein-calorie malnutrition, severe (HCC) 01/24/2018   S/P insertion of IVC (inferior vena caval) filter 01/24/2018   Tetraparesis (HCC)    Past Surgical History:  Procedure Laterality Date   ANTERIOR CERVICAL DECOMP/DISCECTOMY FUSION N/A 11/29/2017   Procedure: Cervical five-six, six-seven Anterior Cervical Decompression Fusion;  Surgeon: Jadene Pierini, MD;  Location: [facility] OR;  Service: Neurosurgery;  Laterality: N/A;  Cervical five-six, six-seven Anterior Cervical Decompression Fusion   CATARACT EXTRACTION     FEMUR IM NAIL Left 08/16/2021   Procedure: INTRAMEDULLARY (IM) NAIL FEMORAL;  Surgeon: Deeann Saint, MD;  Location: [facility] ORS;  Service: Orthopedics;  Laterality: Left;   IR IVC FILTER PLMT / S&I /IMG GUID/MOD SED  12/08/2017   Patient Active Problem List   Diagnosis Date Noted   Hypertensive heart disease with other congestive heart failure (HCC) 02/14/2022   Chronic indwelling Foley catheter 02/14/2022   Closed hip fracture (HCC) 08/15/2021   DVT (deep venous  thrombosis) (HCC) 08/15/2021   Quadriplegia (HCC) 08/15/2021   Fall 08/15/2021   Constipation due to slow transit 08/31/2018   Trauma 06/05/2018   Neuropathic pain 06/05/2018   Neurogenic bowel 06/05/2018   Vaginal yeast infection 01/30/2018   Healthcare-associated pneumonia 01/25/2018   Chronic allergic rhinitis 01/24/2018   Depression with anxiety 01/24/2018   UTI due to Klebsiella species 01/24/2018   Protein-calorie malnutrition, severe (HCC) 01/24/2018   S/P insertion of IVC (inferior vena caval) filter 01/24/2018   Tetraparesis (HCC) 01/20/2018   Neurogenic bladder 01/20/2018   Reactive depression    Benign essential HTN    Acute postoperative anemia due to expected blood loss    Central cord syndrome at C6 level of cervical spinal cord (HCC) 11/29/2017   Allergy to alpha-gal 11/25/2016   SCC (squamous cell carcinoma) 04/08/2014    REFERRING DIAG: Central Cord Syndrome at C6 of the Cervical Spinal Cord, Fall with Bilateral Closed Hip Fractures, with ORIF repair with Intramedullary Nailing of the Left Hip Fracture.  THERAPY DIAG:  Muscle weakness (generalized)  Other lack of coordination  Rationale for Evaluation and Treatment Rehabilitation  PERTINENT HISTORY: Patient s/p fall November 29, 2017 resulting in diagnosis of central cord syndrome at  C 6 level.  She has had therapy in multiple venues but with recent move to [facility], therapy staff recommended she seek outpatient therapy for her needs. Pt. Had a recent fall with Bilateral Closed Hip Fractures, with ORIF repair with Intramedullary Nailing of the Left Hip Fracture.   PRECAUTIONS: None  SUBJECTIVE:   Pt reports washing her face and combing her hair without assist.   PAIN:  Are you having pain? No  OBJECTIVE:    Today's Treatment:   Manual Therapy:   Pt. tolerated soft tissue massage to the volar, and dorsal surface of each digit on the bilateral hands, and thenar eminence secondary to stiffness following  moist heat modality. Soft tissue mobilization was performed for Carpal, and metacarpal spread stretches. Manual therapy was performed independent of, and in preparation for therapeutic Ex.   Therapeutic Ex.    Pt. tolerated PROM followed by AROM bilateral wrist extension, PROM for bilateral digit MP, PIP, and DIP flexion, and extension, thumb radial, and palmar abduction. Emphasis was placed on left wrist extension with moist heat modality.    PATIENT EDUCATION: RUE Distal ROM, and strength Person educated: Patient Education method: Explanation and Demonstration Education comprehension: verbalized understanding, returned demonstration, and needs further education   HOME EXERCISE PROGRAM Continue with ongoing HEPs  Measurements:  12/09/2021:  Shoulder flexion: R: 120(136), L: 138(150) Shoulder abduction: R: 108(120), L: 110(120) Elbow: R: 0-148, L: 0-146 Wrist flexion: R: 55(62), L: 78 Writ extension: R: 34(55), L: 3(4) RD: R: 12(20), L: 16(26) UD: R: 8(24), L: 6(18) Thumb radial abduction: R: 11(20), L: 8(20) Digit flexion to the [facility]: R:  2nd: 5cm(3.5cm), 3rd: 7.5 cm(5cm), 4th: 7cm(3cm), 5th: 6cm(5.5cm) L: 2nd: 8cm(8cm), 3rd: 8cm(8cm), 4th: 7.5cm(7cm), 5th: 7cm(7cm)  02/03/2022:   Shoulder flexion: R: 122(138), L: 148(154) Shoulder abduction: R: 109(125), L: 125(125 Elbow: R: 0-148, L: 0-146 Wrist flexion: R: 60(68), L: 79 Writ extension: R: 40(55), L: 3(10) RD: R: 14(24), L: 20(28) UD: R: 8(24), L: 6(18) Thumb radial abduction: R: 20(25), L: 8(20) Digit flexion to the [facility]: R:  2nd: 4.5cm(3cm), 3rd: 6.5cm(4.5cm), 4th: 6.5 cm(3cm), 5th: 4.5cm(5cm) L: 2nd: 8cm(8cm), 3rd: 8cm(7cm), 4th: 7.5cm(7cm), 5th: 6.5cm(6.5cm)   04/07/2022:   Shoulder flexion: R: 125(150), L: 160(160) Shoulder abduction: R: 109(125), L: 125(125 Elbow: R: 0-148, L: 0-146 Wrist flexion: R: 60(68), L: 79 Writ extension: R: 40(55), L: 5(18) RD: R: 14(24), L: 20(28) UD: R: 18(24), L: 8(18) Thumb  radial abduction: R: 20(25), L: 8(20) Digit flexion to the [facility]: R:  2nd: 4.5 cm(4 cm), 3rd: 6.5cm(3cm), 4th: 7 cm (5cm), 5th: 5cm(5cm) L: 2nd: 8cm(8cm), 3rd: 8cm(7cm), 4th: 7.5cm(7cm), 5th: 7cm(7cm)   07/05/2022:   Shoulder flexion: R: 125(154), L: 160(160) Shoulder abduction: R: 95(130), L: 143(143) Elbow: R: 0-148, L: 0-146 Wrist flexion: R: 60(68), L: 79 Writ extension: R: 40(60), L: 0(12) RD: R: 10(24), L: 12(20) UD: R: 16(24), L: 8(16) Thumb radial abduction: R: 20(25), L: 8(20) Digit flexion to the [facility]: R:  2nd: 4 cm(4cm), 3rd: 6.5cm( 3.5cm), 4th: 7 cm (5cm), 5th: 6cm(5cm) L: 2nd: 8cm(8cm), 3rd: 8cm(7cm), 4th: 8cm(7cm), 5th: 7cm(7cm)            OT Long Term Goals - 07/06/21 1106  Target Date:  09/27/2022      OT LONG TERM GOAL #1   Title Patient and caregiver will demonstrate understanding of home exercise program for ROM.    Baseline 12/09/2021: Pt. Reports the restorative ROM program has not resumed at the facility. 10/28/2021: Pt. Is attempting exercises/stretches at home. The facility restorative aides have not resumed exercises. 09/14/2021:  Pt. Reports inconsistencies with restorative ROM at  her care home. Pt/caregiver education to be provided about ongoing ROM needs.    Time 12    Period Weeks    Status Deferred     OT LONG TERM GOAL #2   Title Pt.  will improve BUE strength to be able to sustain her UE's in elevation to efficiently, and effectively perform hair care, and self-grooming tasks.   Baseline 02/03/2022: Pt. has improved, and is now able to sustain her Bilateral UEs in elevation long enough to efficiently, and effectively perform haircare, and self-grooming tasks. 12/09/2021: right shoulder 4-/5, left shoulder 4/5. Pt. has improved to be able to reach up for hair care, and self-grooming tasks. 10/28/2021: Right shoulder 3+/5, Left shoulder 4-/5 09/14/2021:  BUE shoulder  strength: 3+/5 for shoulder flexion, and abduction.   Time 12    Period Weeks    Status Achieved               OT LONG TERM GOAL #3   Title Patient will demonstrate improved composite finger flexion to be able to firmly hold  and use adaptive devices during ADLs, and IADLs.   Baseline 07/05/2022: Pt. presents with digit MP, PIP, and DIP extensor tightness limiting her ability to securely grip objects in her bilateral hands.  04/07/2022: Pt. Has improved with right 2nd, and 3rd digit flexion towards the [facility]. Pt. Continues to have difficulty securely holding and applying deodorant.02/03/2022: Pt. Has improved with digit flexion, however continues to have difficulty securely holding and using deoderant. 12/09/2021: Bilateral hand/digit MP, PIP, and DIP extension tightness limits her ability achieve digit flexion to hold, and apply deodorant. 10/28/2021:  Pt. continues to have difficulty holding the deodorant. 01/12/2021: Pt. Presents with limited digit extension. Pt. Is able to initiate holding deodorant, however is unable to hold it while using it.   Time 12    Period Weeks    Status Revised   Target Date                  OT LONG TERM GOAL #4   Title Pt. Will improve bilateral wrist extension in preparation for anticipating, and initiating reaching for objects at the table.    Baseline 07/05/2022: Right: 40(60) Left: 0(12) 04/07/2022: Rght: 40(55) Left: 5(18) 02/03/2022: Right: 34(55) Left: 3(4) 12/09/2021: Right  34(55)  Left  3(4) 10/28/2021:  Right 22(38), Left 0(15) 09/14/2021:  Right: 17(35), left 2(15)   Time 12   Period weeks   Status Ongoing   Target Date      OT LONG TERM GOAL #5   Title Pt. will write, and sign her name with 100% legibility, and modified independence.    Baseline 6/03/204: Pt. Is consistently filling out  daily meal menus, and puzzles. 04/07/2022: Pt. Reports that she continues to present with shaky appearing writing. Pt.reports that she is writing more when filling out her  menu, and complete puzzles 02/03/2022: Pt. Continues to report legibility appears "shaky" when attempting signing her name. Pt.reports that she is writing more when filling out her menu, and complete puzzles.12/09/2021: Pt. Reports legibility appears "shaky" when  attempting signing her name. Pt.reports that she is writing more when filling out her menu. 10/28/2021: pt. is able to sign her name, however legibility continues to be inconsistent. Pt. Reports "shaky appearance". 09/14/2021: Pt. continues to fill out her daily menu and complete puzzles. Pt. Continues to present with limited writing legibility.   Time 12    Period Weeks    Status  Ongoing         OT LONG TERM GOAL  #6   TITLE Pt. will turn pages in a book with modified independence.    Baseline 02/03/2022: Pt. Is able to effectively turn pages of a book. 10/28/2021: Pt. Is able to consistently turn book pages. 09/14/2021: Pt. has difficulty turning pages.    Time 12    Period Weeks    Status Achieved       OT LONG TERM GOAL  #7  TITLE Pt. Will increase  bilateral lateral pinch strength by 2 lbs to be able to securely grasp items during ADLs, and IADL tasks.   Baseline 07/05/2022: TBD 07/2022: NT-Pinch meter out for calibration. 02/03/2022: Right: 6#, Left: 4# 12/09/2021:  Right: 6#, Left: 4#   Time 12  Period Weeks  Status Ongoing           OT LONG TERM GOAL  #8  TITLE Pt. Will button a sweater with large 1" buttons, and zip her jacket with modified independence.   Baseline 06/07/2022: Pt. is able to use a button hook to button a sweater. Pt. Reports that she does not wear button down clothing often.  04/07/2022: Pt. Has difficulty manipulating buttons, however was introduced to a buttonhook. Pt. Was able to hold, and use a buttonhook for buttons on a shirt. 02/03/2022: Pt. is unable to manipulate buttons on her sweater, or zippers on her jacket.  Time 12  Period Weeks  Status Achieved   OT LONG TERM GOAL  #9  TITLE Pt. will complete  plant care with modified independence.  Baseline 07/05/2022: pt. Is able to water, and care for some  of her plants. Pt. Has more difficulty with plants that are harder to reach. 04/07/2022: Pt. is now able to hold a cup and water her plants.02/03/2022: Pt. has difficulty caring for her plants.   Time 12  Period Weeks  Status Ongoing   OT LONG TERM GOAL  #10  TITLE Pt. will demonstrate adaptive techniques to assist with the efficiency of self-dressing, or morning care tasks.  Baseline 07/05/2022: Continue with goal.  04/07/2022: Pt. Continues to require assist with the efficiency of self-dressing, and morning care tasks.02/03/2022: Pt. requires assist from caregivers 2/2 time limitations during morning care.   Time 12  Period Weeks  Status Ongoing   Title Pt.  will improve BUE strength to improve ADL, and IADL functioning.  Baseline 07/05/2022: shoulder flexion: right 4-/5, abduction: 3+/5, elbow flexion: right: 5/5, left 5/5, extension: right: 4-/5, left 4-/5, wrist extension: right: 3-/5, left: 2-/5  Time 12   Period Weeks   Status New     Plan - 07/22/21 1737     Clinical Impression Statement Pt. reports  that she is now able to fold shirts, and organize her husband's dresser drawers. Pt. Reports that she is not able to reach to the lowest drawer. Pt. continues to present with tightness at the bilateral thumb webspace, and bilateral wrist flexor tightness, as well as digit MP, PIP, and DIP extensor tightness which continues to limit her ability to complete ADL tasks efficiently.  Pt. continues to benefit from working on impoving ROM in order to work towards increasing bilateral hand grasp on objects, and increase engagement of bilateral hands during ADLs, and IADL tasks.         OT Occupational Profile and History Detailed Assessment- Review of Records and additional review of physical, cognitive, psychosocial history related to current functional performance    Occupational performance deficits  (Please refer to evaluation for details): ADL's;IADL's;Leisure;Social Participation    Body Structure / Function / Physical Skills ADL;Continence;Dexterity;Flexibility;Strength;ROM;Balance;Coordination;FMC;IADL;Endurance;UE functional use;Decreased knowledge of use of DME;[facility]    Psychosocial Skills Coping Strategies    Rehab Potential Fair    Clinical Decision Making Several treatment options, min-mod task modification necessary    Comorbidities Affecting Occupational Performance: Presence of comorbidities impacting occupational performance    Comorbidities impacting occupational performance description: contractures of bilateral hands, dependent transfers,    Modification or Assistance to Complete Evaluation  No modification of tasks or assist necessary to complete eval    OT Frequency 2x / week    OT Duration 12 weeks    OT Treatment/Interventions Self-care/ADL training;Cryotherapy;Therapeutic exercise;DME and/or AE instruction;Balance training;Neuromuscular education;Manual Therapy;Splinting;Moist Heat;Passive range of motion;Therapeutic activities;Patient/family education    Plan continue to progress ROM of digits, wrists and shoulders as it pertains to completion of ADL tasks that pt values.    Consulted and Agree with Plan of Care Patient            Olegario Messier, MS, OTR/L   07/14/22, 12:04 PM

## 2022-07-14 NOTE — Therapy (Signed)
OUTPATIENT PHYSICAL THERAPY NEURO TREATMENT   Patient Name: Sherry [name] MRN: [id number] DOB:May 18, 1934, 88 y.o., female Today's Date: 07/14/2022   PCP: Earnestine Mealing, MD REFERRING PROVIDER: Deeann Saint   PT End of Session - 07/14/22 1053     Visit Number 53    Number of Visits 74    Date for PT Re-Evaluation 09/27/22    Progress Note Due on Visit 60    PT Start Time 1144    PT Stop Time 1222    PT Time Calculation (min) 38 min    Activity Tolerance Patient tolerated treatment well    Behavior During Therapy WFL for tasks assessed/performed                            Past Medical History:  Diagnosis Date   Acute blood loss anemia    Arthritis    Cancer (HCC)    skin   Central cord syndrome at C6 level of cervical spinal cord (HCC) 11/29/2017   Hypertension    Protein-calorie malnutrition, severe (HCC) 01/24/2018   S/P insertion of IVC (inferior vena caval) filter 01/24/2018   Tetraparesis (HCC)    Past Surgical History:  Procedure Laterality Date   ANTERIOR CERVICAL DECOMP/DISCECTOMY FUSION N/A 11/29/2017   Procedure: Cervical five-six, six-seven Anterior Cervical Decompression Fusion;  Surgeon: Jadene Pierini, MD;  Location: [facility] OR;  Service: Neurosurgery;  Laterality: N/A;  Cervical five-six, six-seven Anterior Cervical Decompression Fusion   CATARACT EXTRACTION     FEMUR IM NAIL Left 08/16/2021   Procedure: INTRAMEDULLARY (IM) NAIL FEMORAL;  Surgeon: Deeann Saint, MD;  Location: [facility] ORS;  Service: Orthopedics;  Laterality: Left;   IR IVC FILTER PLMT / S&I /IMG GUID/MOD SED  12/08/2017   Patient Active Problem List   Diagnosis Date Noted   Hypertensive heart disease with other congestive heart failure (HCC) 02/14/2022   Chronic indwelling Foley catheter 02/14/2022   Closed hip fracture (HCC) 08/15/2021   DVT (deep venous thrombosis) (HCC) 08/15/2021   Quadriplegia (HCC) 08/15/2021   Fall 08/15/2021   Constipation due to slow  transit 08/31/2018   Trauma 06/05/2018   Neuropathic pain 06/05/2018   Neurogenic bowel 06/05/2018   Vaginal yeast infection 01/30/2018   Healthcare-associated pneumonia 01/25/2018   Chronic allergic rhinitis 01/24/2018   Depression with anxiety 01/24/2018   UTI due to Klebsiella species 01/24/2018   Protein-calorie malnutrition, severe (HCC) 01/24/2018   S/P insertion of IVC (inferior vena caval) filter 01/24/2018   Tetraparesis (HCC) 01/20/2018   Neurogenic bladder 01/20/2018   Reactive depression    Benign essential HTN    Acute postoperative anemia due to expected blood loss    Central cord syndrome at C6 level of cervical spinal cord (HCC) 11/29/2017   Allergy to alpha-gal 11/25/2016   SCC (squamous cell carcinoma) 04/08/2014    ONSET DATE: 08/15/2021 (fall with B hip fx); Initial injury was in 2019- quadraplegia due to central cord syndrome of C6 secondary to neck fracture.   REFERRING DIAG: Bilateral Hip fx; Left Open- s/p Left IM nail femoral on 08/16/2021; Right closed  THERAPY DIAG:  Muscle weakness (generalized)  Other lack of coordination  Central cord syndrome, sequela (HCC)  Abnormality of gait and mobility  Difficulty in walking, not elsewhere classified  Closed fracture of both hips, initial encounter (HCC)  Other abnormalities of gait and mobility  Unsteadiness on feet  Rationale for Evaluation and Treatment Rehabilitation  SUBJECTIVE:   SUBJECTIVE STATEMENT:  Patient reports feeling good with no residual soreness after last visit.   Pt accompanied by:  Paid caregiver-   PERTINENT HISTORY: Patient was previous patient (see previous episode for specifics) She experienced a fall at home on 08/15/2021 and was in Hospital from 08/15/2021- 08/20/2021.  Most recent history per Dr. Shonna Chock on 08/20/2021.  Sherry [name] is a 88 y.o. female with medical history significant of quadriplegia due to central cord syndrome of C6 secondary to neck fracture,  hypertension, depression with anxiety, neurogenic bladder, bilateral DVT on Xarelto (s/p of IVC filter placement), who presents with fall and hip pain.   Patient has history of quadriplegia, and has been doing PT/OT with improvement of function. Pt has some use of both arms and can feed herself. She can walk a little bit with assistance. Pt accidentally fell at about 930 this morning after taking shower.  She injured her hips. Hospital Course:    Closed left hip fracture [facility]) s/p mechanical fall Closed right hip fracture (?old) Acute blood loss anemia --pt is s/p  INTRAMEDULLARY (IM) NAIL FEMORAL-left on 7/16  PAIN:  Are you having pain? NO: NPRS scale: 0/10 Pain location: Right hip Pain description: Ache Aggravating factors: any active Movement of right side Relieving factors: Rest  PRECAUTIONS: Fall  WEIGHT BEARING RESTRICTIONS Yes Bilateral non-weight bearing B LE's  FALLS: Has patient fallen in last 6 months? Yes. Number of falls 1  LIVING ENVIRONMENT: Lives with: lives with their family and lives in a nursing home Lives in: Other [facility] term care at [facility] Stairs: No Has following equipment at home: Wheelchair (power), Ramped entry, and hoyer lift  PLOF: Needs assistance with ADLs, Needs assistance with homemaking, Needs assistance with gait, Needs assistance with transfers, and Leisure: Work puzzles, read, Watch TV  PATIENT GOALS Patient goal is to walk; Be able to strengthen my arms for better use  OBJECTIVE:   DIAGNOSTIC FINDINGS: Overall cognitive statusEXAM: DG HIP (WITH OR WITHOUT PELVIS) 2-3V LEFT; DG HIP (WITH OR WITHOUT PELVIS) 2-3V RIGHT   COMPARISON:  CT of the abdomen and pelvis 01/21/2020   FINDINGS: Left proximal femur fracture noted just below the intertrochanteric region. Marked ventral angulation noted on the cross-table image. Slight varus angulation present   Comminuted intratrochanteric fracture present on the right also with marked ventral  angulation.   IMPRESSION: 1. Bilateral hip fractures. 2. Comminuted intertrochanteric fracture of the right hip. 3. Angulated fracture of the proximal left femur just below the intertrochanteric region.     Electronically Signed   By: Marin Roberts M.D.   On: 08/15/2021 11:54:   COGNITION:  Within functional limits for tasks assessed   SENSATION: Light touch: Impaired   EDEMA:  None  Strength R/L 2-/2- Shoulder flexion (anterior deltoid/pec major/coracobrachialis, axillary n. (C5-6) and musculocutaneous n. (C5-7)) 2-/3+ (within available ROM around 130 on lef)Shoulder abduction (deltoid/supraspinatus, axillary/suprascapular n, C5) 3+/3+ Shoulder external rotation (infraspinatus/teres minor) 4/4 Shoulder internal rotation (subcapularis/lats/pec major) 3+/3+ Shoulder extension (posterior deltoid, lats, teres major, axillary/thoracodorsal n.) 3+/3+ Shoulder horizontal abduction 4/4 Elbow flexion (biceps brachii, brachialis, brachioradialis, musculoskeletal n, C5-6) 4/4 Elbow extension (triceps, radial n, C7) 2-/2- Wrist Extension 3+/3+ Wrist Flexion   LOWER EXTREMITY MMT:    MMT Right 01/11/22 Left 01/11/22  Hip flexion 2- 2-  Hip extension 2- 2-  Hip abduction 2- 2-  Hip adduction 2- 3  Hip internal rotation 2- 2-  Hip external rotation 2- 2-  Knee flexion 2- 2-  Knee extension 2+  3+  Ankle dorsiflexion 2- NT - boot  Ankle plantarflexion 2- NT- boot  Ankle inversion 2- NT- boot  Ankle eversion 2- NT- boot  (Blank rows = not tested)  BED MOBILITY:  Unable to test due to NWB status of BLE- Patient currently dependent with all bed mobility using hoyer and caregiver assist  TRANSFERS: Currently BLE NWB- Dependent on use of hoyer for all transfers  FUNCTIONAL TESTs:   PATIENT SURVEYS:  FOTO 12  TODAY'S TREATMENT:    Therapeutic activities:   Sit to stand x 6 trials- Focusing on increasing left knee flex with position foot in best position for  standing. Increased VC to weightshift forward to propel into standing. Patient required max assist for each Trial.   Static standing x 5 trials- each trial between 45 sec to 2 min- initially difficulty with standing erect with left knee hyperextended and poor gluteal activation. She did improve with each trial and on last 2 stands- able to tighten glutes for more erect standing and patient able to stand with improved posture and duration.   Patient required rest breaks between trials to recover.       Pt educated throughout session about proper posture and technique with exercises. Improved exercise technique, movement at target joints, use of target muscles after min to mod verbal, visual, tactile cues.   PATIENT EDUCATION: Education details: PT plan of care/use of resistive bands Person educated: Patient Education method: Explanation, Demonstration, Tactile cues, and Verbal cues Education comprehension: verbalized understanding, returned demonstration, verbal cues required, tactile cues required, and needs further education   HOME EXERCISE PROGRAM: No changes at this time  Access Code: [facility] URL: [email or web address] Date: 11/23/2021 Prepared by: Precious Bard Exercises - Seated Gluteal Sets - 2 x daily - 7 x weekly - 2 sets - 10 reps - 5 hold - Seated Quad Set - 2 x daily - 7 x weekly - 2 sets - 10 reps - 5 hold - Seated Long Arc Quad - 2 x daily - 7 x weekly - 2 sets - 10 reps - 5 hold - Seated March - 2 x daily - 7 x weekly - 2 sets - 10 reps - 5 hold - Seated Hip Abduction - 2 x daily - 7 x weekly - 2 sets - 10 reps - 5 hold - Seated Shoulder Shrugs - 2 x daily - 7 x weekly - 2 sets - 10 reps - 5 hold - Wheelchair Pressure Relief - 2 x daily - 7 x weekly - 2 sets - 10 reps - 5 hold  GOALS: Goals reviewed with patient? Yes  SHORT TERM GOALS: Target date: 02/22/2022  Pt will be independent with initial UE strengthening HEP in order to improve strength and balance in  order to decrease fall risk and improve function at home and work. Baseline: 10/19/2021- patient with no formal UE HEP; 12/02/2021= Patient verbalized knowledge of HEP including use of theraband for UE strengthening.  Goal status: GOAL MET  LONG TERM GOALS: Target date: 09/27/2022  Pt will be independent with final for UE/LE HEP in order to improve strength and balance in order to decrease fall risk and improve function at home and work. Baseline: Patient is currently BLE NWB and unable to participate in HEP. Has order for UE strengthening. 01/11/2022- Patient now able to participate in LE strengthening although NWB still- good understanding for some basic exercises. Will keep goal active to incorporate progressive LE strengthening exercises. 04/07/2022= Patient still participating in progressive  LE seated HEP and has no questions at this time.  Goal status: MET  2.  Pt will improve FOTO to target score of 35  to display perceived improvements in ability to complete ADL's.  Baseline: 10/19/2021= 12; 12/02/2021= 17; 01/11/2022=17; 04/07/2022= 17; 07/05/2022=17 Goal status: Ongoing  3.  Pt will increase strength of B UE  by at least 1/2 MMT grade in order to demonstrate improvement in strength and function  Baseline: patient has range of 2-/5 to 4/5 BUE Strength; 12/02/2021= 4/5 except with wrist ext. 01/11/2022= 4/5 B UE strength expept for wrist Goal status: Goal revised-no longer appropriate- working with OT on all UE strengthening.   4. Pt. Will increase strength of RLE by at least 1/2 MMT grade in order to demonstrate improvement in Standing/transfers.  Baseline: 2-/5 Right hip flex/knee ext/flex; 04/07/2022= 2- with right hip flex/knee ext (lacking 28 deg from zero) 4/8= Patient able to ext right knee lacking 18 deg from zero. 07/05/2022=Left knee approx 8 deg from zero and right knee = 23 deg from zero  Goal status: Progressing  5. Pt. Will demo ability to stand pivot transfer with max assist for improved  functional mobility and less dependent need on mechanical device.   Baseline: Dependent on hoyer lift for all transfers; 04/07/2022- Unable to test secondary to patient with recent UTI and right hand procedure - will attempt to reassess next visit. ; 04/12/22: Unable to successfully stand due to feet plates on loaned power w/c; 05/10/2022- Patient still having to use rental chair and has not received her original power w/c back to practice SPT. 07/04/2021= Patient just received new power w/c and attempted standing from 1st time today- Patient able to stand with max assist today from w/c and did not attempt to perform pivot transfer- difficulty with static standing and placing weight on right LE.   Goal status: ONGOING  6. Pt. Will demonstrate improved functional LE strength as seen by ability to stand > 2 min for improved transfer ability and pregait abilities.   Baseline: not assessed today due to recent dx: UTI; 04/07/2022- Unable to test secondary to patient with recent UTI and right hand procedure - will attempt to reassess next visit; 04/12/22: Unable to successfully stand due to feet plates on loaned power w/c4/09/2022- Patient still having to use rental chair and has not received her original power w/c back to practice Standing. 07/05/2022- Patient able to stand with max assist at trunk today for 2 min 3 sec. Will keep goal active to ensure consistency.   Goal status: ONGOING  ASSESSMENT:  CLINICAL IMPRESSION: Treatment resumed standing today. Patient presented with some initial difficulty- able to stand but increased trunk flex and poor angle of left knee. In subsequent stands- she was able to activate gluteals better and stand more erect with less overall physical assist once in standing. On last stand - she only required min assist to maintain standing approx 2 min vs. Max assist.   Pt will continue to benefit from skilled PT services to optimize independence and reduced caregiver support.    OBJECTIVE  IMPAIRMENTS Abnormal gait, decreased activity tolerance, decreased balance, decreased coordination, decreased endurance, decreased mobility, difficulty walking, decreased ROM, decreased strength, hypomobility, impaired flexibility, impaired UE functional use, postural dysfunction, and pain.   ACTIVITY LIMITATIONS carrying, lifting, bending, sitting, standing, squatting, sleeping, stairs, transfers, bed mobility, continence, bathing, toileting, dressing, self feeding, reach over head, hygiene/grooming, and caring for others  PARTICIPATION LIMITATIONS: meal prep, cleaning, laundry, medication management, personal  finances, interpersonal relationship, driving, shopping, community activity, and yard work  PERSONAL FACTORS Age, Time since onset of injury/illness/exacerbation, and 1-2 comorbidities: HTN, cervical Sx  are also affecting patient's functional outcome.   REHAB POTENTIAL: Good  CLINICAL DECISION MAKING: Evolving/moderate complexity  EVALUATION COMPLEXITY: Moderate  PLAN: PT FREQUENCY: 1-2x/week  PT DURATION: 12 weeks  PLANNED INTERVENTIONS: Therapeutic exercises, Therapeutic activity, Neuromuscular re-education, Balance training, Gait training, Patient/Family education, Self Care, Joint mobilization, DME instructions, Dry Needling, Electrical stimulation, Wheelchair mobility training, Spinal mobilization, Cryotherapy, Moist heat, and Manual therapy  PLAN FOR NEXT SESSION: Continue with weight bearing activities, hoyer over to mat table and practice standing next visit and LE strengthening as appropriate.   Louis Meckel, PT Physical Therapist- [facility]  07/14/2022, 2:58 PM

## 2022-07-15 ENCOUNTER — Ambulatory Visit: Payer: Medicare HMO | Admitting: Occupational Therapy

## 2022-07-19 ENCOUNTER — Ambulatory Visit: Payer: Medicare HMO | Admitting: Occupational Therapy

## 2022-07-19 ENCOUNTER — Ambulatory Visit: Payer: Medicare HMO

## 2022-07-19 DIAGNOSIS — M6281 Muscle weakness (generalized): Secondary | ICD-10-CM | POA: Diagnosis not present

## 2022-07-19 NOTE — Therapy (Signed)
Occupational Therapy Progress Note  Dates of reporting period  05/26/2022   to   07/19/2022      Patient Name: Sherry [name] MRN: [id number] DOB:[birth year], 88 y.o., female Today's Date: 07/19/2022                                         PCP: Dr. Tillman Abide REFERRING PROVIDER: Dr. Tillman Abide   OT End of Session - 07/19/22 1157     Visit Number [id number]    Number of Visits 96    Date for OT Re-Evaluation 09/27/22    Authorization Time Period Progress report period starting 12/14/2021    OT Start Time 1100    OT Stop Time 1145    OT Time Calculation (min) 45 min    Equipment Utilized During Treatment moist heat    Activity Tolerance Patient tolerated treatment well    Behavior During Therapy WFL for tasks assessed/performed                  Past Medical History:  Diagnosis Date   Acute blood loss anemia    Arthritis    Cancer (HCC)    skin   Central cord syndrome at C6 level of cervical spinal cord (HCC) 11/29/2017   Hypertension    Protein-calorie malnutrition, severe (HCC) 01/24/2018   S/P insertion of IVC (inferior vena caval) filter 01/24/2018   Tetraparesis (HCC)    Past Surgical History:  Procedure Laterality Date   ANTERIOR CERVICAL DECOMP/DISCECTOMY FUSION N/A 11/29/2017   Procedure: Cervical five-six, six-seven Anterior Cervical Decompression Fusion;  Surgeon: Jadene Pierini, MD;  Location: [facility] OR;  Service: Neurosurgery;  Laterality: N/A;  Cervical five-six, six-seven Anterior Cervical Decompression Fusion   CATARACT EXTRACTION     FEMUR IM NAIL Left 08/16/2021   Procedure: INTRAMEDULLARY (IM) NAIL FEMORAL;  Surgeon: Deeann Saint, MD;  Location: [facility] ORS;  Service: Orthopedics;  Laterality: Left;   IR IVC FILTER PLMT / S&I /IMG GUID/MOD SED  12/08/2017   Patient Active Problem List   Diagnosis Date Noted   Hypertensive heart disease with other congestive heart failure (HCC) 02/14/2022   Chronic indwelling Foley catheter 02/14/2022   Closed hip  fracture (HCC) 08/15/2021   DVT (deep venous thrombosis) (HCC) 08/15/2021   Quadriplegia (HCC) 08/15/2021   Fall 08/15/2021   Constipation due to slow transit 08/31/2018   Trauma 06/05/2018   Neuropathic pain 06/05/2018   Neurogenic bowel 06/05/2018   Vaginal yeast infection 01/30/2018   Healthcare-associated pneumonia 01/25/2018   Chronic allergic rhinitis 01/24/2018   Depression with anxiety 01/24/2018   UTI due to Klebsiella species 01/24/2018   Protein-calorie malnutrition, severe (HCC) 01/24/2018   S/P insertion of IVC (inferior vena caval) filter 01/24/2018   Tetraparesis (HCC) 01/20/2018   Neurogenic bladder 01/20/2018   Reactive depression    Benign essential HTN    Acute postoperative anemia due to expected blood loss    Central cord syndrome at C6 level of cervical spinal cord (HCC) 11/29/2017   Allergy to alpha-gal 11/25/2016   SCC (squamous cell carcinoma) 04/08/2014    REFERRING DIAG: Central Cord Syndrome at C6 of the Cervical Spinal Cord, Fall with Bilateral Closed Hip Fractures, with ORIF repair with Intramedullary Nailing of the Left Hip Fracture.  THERAPY DIAG:  Muscle weakness (generalized)  Rationale for Evaluation and Treatment Rehabilitation  PERTINENT HISTORY: Patient s/p fall November 29, 2017  resulting in diagnosis of central cord syndrome at C 6 level.  She has had therapy in multiple venues but with recent move to [facility], therapy staff recommended she seek outpatient therapy for her needs. Pt. Had a recent fall with Bilateral Closed Hip Fractures, with ORIF repair with Intramedullary Nailing of the Left Hip Fracture.   PRECAUTIONS: None  SUBJECTIVE:   Pt reports washing her face and combing her hair without assist.   PAIN:  Are you having pain? No  OBJECTIVE:    Today's Treatment:   Manual Therapy:   Pt. tolerated soft tissue massage to the volar, and dorsal surface of each digit on the bilateral hands, and thenar eminence secondary to  stiffness following moist heat modality. Soft tissue mobilization was performed for Carpal, and metacarpal spread stretches. Manual therapy was performed independent of, and in preparation for therapeutic Ex.   Therapeutic Ex.    Pt. tolerated PROM followed by AROM bilateral wrist extension, PROM for bilateral digit MP, PIP, and DIP flexion, and extension, thumb radial, and palmar abduction. Pt. performed 3# dowel ex. For UE strengthening secondary to weakness. Bilateral shoulder flexion, chest press, circular patterns, and elbow flexion/extension were performed. Emphasis was placed on left wrist extension with moist heat modality. Pt. worked on the BUE for strengthening, and reciprocal motion using the UBE while seated for 8 min. with no resistance. Constant monitoring was provided.    PATIENT EDUCATION: RUE Distal ROM, and strength Person educated: Patient Education method: Explanation and Demonstration Education comprehension: verbalized understanding, returned demonstration, and needs further education   HOME EXERCISE PROGRAM Continue with ongoing HEPs  Measurements:  12/09/2021:  Shoulder flexion: R: 120(136), L: 138(150) Shoulder abduction: R: 108(120), L: 110(120) Elbow: R: 0-148, L: 0-146 Wrist flexion: R: 55(62), L: 78 Writ extension: R: 34(55), L: 3(4) RD: R: 12(20), L: 16(26) UD: R: 8(24), L: 6(18) Thumb radial abduction: R: 11(20), L: 8(20) Digit flexion to the [facility]: R:  2nd: 5cm(3.5cm), 3rd: 7.5 cm(5cm), 4th: 7cm(3cm), 5th: 6cm(5.5cm) L: 2nd: 8cm(8cm), 3rd: 8cm(8cm), 4th: 7.5cm(7cm), 5th: 7cm(7cm)  02/03/2022:   Shoulder flexion: R: 122(138), L: 148(154) Shoulder abduction: R: 109(125), L: 125(125 Elbow: R: 0-148, L: 0-146 Wrist flexion: R: 60(68), L: 79 Writ extension: R: 40(55), L: 3(10) RD: R: 14(24), L: 20(28) UD: R: 8(24), L: 6(18) Thumb radial abduction: R: 20(25), L: 8(20) Digit flexion to the [facility]: R:  2nd: 4.5cm(3cm), 3rd: 6.5cm(4.5cm), 4th: 6.5 cm(3cm),  5th: 4.5cm(5cm) L: 2nd: 8cm(8cm), 3rd: 8cm(7cm), 4th: 7.5cm(7cm), 5th: 6.5cm(6.5cm)   04/07/2022:   Shoulder flexion: R: 125(150), L: 160(160) Shoulder abduction: R: 109(125), L: 125(125 Elbow: R: 0-148, L: 0-146 Wrist flexion: R: 60(68), L: 79 Writ extension: R: 40(55), L: 5(18) RD: R: 14(24), L: 20(28) UD: R: 18(24), L: 8(18) Thumb radial abduction: R: 20(25), L: 8(20) Digit flexion to the [facility]: R:  2nd: 4.5 cm(4 cm), 3rd: 6.5cm(3cm), 4th: 7 cm (5cm), 5th: 5cm(5cm) L: 2nd: 8cm(8cm), 3rd: 8cm(7cm), 4th: 7.5cm(7cm), 5th: 7cm(7cm)   07/05/2022:   Shoulder flexion: R: 125(154), L: 160(160) Shoulder abduction: R: 95(130), L: 143(143) Elbow: R: 0-148, L: 0-146 Wrist flexion: R: 60(68), L: 79 Writ extension: R: 40(60), L: 0(12) RD: R: 10(24), L: 12(20) UD: R: 16(24), L: 8(16) Thumb radial abduction: R: 20(25), L: 8(20) Digit flexion to the [facility]: R:  2nd: 4 cm(4cm), 3rd: 6.5cm( 3.5cm), 4th: 7 cm (5cm), 5th: 6cm(5cm) L: 2nd: 8cm(8cm), 3rd: 8cm(7cm), 4th: 8cm(7cm), 5th: 7cm(7cm)            OT  Long Term Goals - 07/06/21 1106                                                                                          Target Date:  09/27/2022      OT LONG TERM GOAL #1   Title Patient and caregiver will demonstrate understanding of home exercise program for ROM.    Baseline 12/09/2021: Pt. Reports the restorative ROM program has not resumed at the facility. 10/28/2021: Pt. Is attempting exercises/stretches at home. The facility restorative aides have not resumed exercises. 09/14/2021:  Pt. Reports inconsistencies with restorative ROM at  her care home. Pt/caregiver education to be provided about ongoing ROM needs.    Time 12    Period Weeks    Status Deferred     OT LONG TERM GOAL #2   Title Pt.  will improve BUE strength to be able to sustain her UE's in elevation to efficiently, and effectively perform hair care, and self-grooming tasks.   Baseline 02/03/2022: Pt. has improved, and is  now able to sustain her Bilateral UEs in elevation long enough to efficiently, and effectively perform haircare, and self-grooming tasks. 12/09/2021: right shoulder 4-/5, left shoulder 4/5. Pt. has improved to be able to reach up for hair care, and self-grooming tasks. 10/28/2021: Right shoulder 3+/5, Left shoulder 4-/5 09/14/2021:  BUE shoulder strength: 3+/5 for shoulder flexion, and abduction.   Time 12    Period Weeks    Status Achieved               OT LONG TERM GOAL #3   Title Patient will demonstrate improved composite finger flexion to be able to firmly hold  and use adaptive devices during ADLs, and IADLs.   Baseline 07/05/2022: Pt. presents with digit MP, PIP, and DIP extensor tightness limiting her ability to securely grip objects in her bilateral hands.  04/07/2022: Pt. Has improved with right 2nd, and 3rd digit flexion towards the [facility]. Pt. Continues to have difficulty securely holding and applying deodorant.02/03/2022: Pt. Has improved with digit flexion, however continues to have difficulty securely holding and using deoderant. 12/09/2021: Bilateral hand/digit MP, PIP, and DIP extension tightness limits her ability achieve digit flexion to hold, and apply deodorant. 10/28/2021:  Pt. continues to have difficulty holding the deodorant. 01/12/2021: Pt. Presents with limited digit extension. Pt. Is able to initiate holding deodorant, however is unable to hold it while using it.   Time 12    Period Weeks    Status Revised   Target Date                  OT LONG TERM GOAL #4   Title Pt. Will improve bilateral wrist extension in preparation for anticipating, and initiating reaching for objects at the table.    Baseline 07/19/2022: Bilateral wrist extension in limited. 07/05/2022: Right: 40(60) Left: 0(12) 04/07/2022: Rght: 40(55) Left: 5(18) 02/03/2022: Right: 34(55) Left: 3(4) 12/09/2021: Right  34(55)  Left  3(4) 10/28/2021:  Right 22(38), Left 0(15) 09/14/2021:  Right: 17(35), left 2(15)   Time 12    Period weeks   Status Ongoing   Target Date  OT LONG TERM GOAL #5   Title Pt. will write, and sign her name with 100% legibility, and modified independence.    Baseline 07/19/2022:  Pt. continues to be able to consistently fill out daily meal menus, and puzzles. 6/03/204: Pt. Is consistently filling out  daily meal menus, and puzzles. 04/07/2022: Pt. Reports that she continues to present with shaky appearing writing. Pt.reports that she is writing more when filling out her menu, and complete puzzles 02/03/2022: Pt. Continues to report legibility appears "shaky" when attempting signing her name. Pt.reports that she is writing more when filling out her menu, and complete puzzles.12/09/2021: Pt. Reports legibility appears "shaky" when attempting signing her name. Pt.reports that she is writing more when filling out her menu. 10/28/2021: pt. is able to sign her name, however legibility continues to be inconsistent. Pt. Reports "shaky appearance". 09/14/2021: Pt. continues to fill out her daily menu and complete puzzles. Pt. Continues to present with limited writing legibility.   Time 12    Period Weeks    Status  Ongoing         OT LONG TERM GOAL  #6   TITLE Pt. will turn pages in a book with modified independence.    Baseline 02/03/2022: Pt. Is able to effectively turn pages of a book. 10/28/2021: Pt. Is able to consistently turn book pages. 09/14/2021: Pt. has difficulty turning pages.    Time 12    Period Weeks    Status Achieved       OT LONG TERM GOAL  #7  TITLE Pt. Will increase  bilateral lateral pinch strength by 2 lbs to be able to securely grasp items during ADLs, and IADL tasks.   Baseline 07/19/2022: TBD 07/05/2022: TBD 07/2022: NT-Pinch meter out for calibration. 02/03/2022: Right: 6#, Left: 4# 12/09/2021:  Right: 6#, Left: 4#   Time 12  Period Weeks  Status Ongoing           OT LONG TERM GOAL  #8  TITLE Pt. Will button a sweater with large 1" buttons, and zip her jacket with  modified independence.   Baseline 06/07/2022: Pt. is able to use a button hook to button a sweater. Pt. Reports that she does not wear button down clothing often.  04/07/2022: Pt. Has difficulty manipulating buttons, however was introduced to a buttonhook. Pt. Was able to hold, and use a buttonhook for buttons on a shirt. 02/03/2022: Pt. is unable to manipulate buttons on her sweater, or zippers on her jacket.  Time 12  Period Weeks  Status Achieved   OT LONG TERM GOAL  #9  TITLE Pt. will complete plant care with modified independence.  Baseline 07/19/2022: Pt. Continues to be able to water, and care for some  of her plants. Pt. Has more difficulty with plants that are harder to reach. 07/05/2022: pt. Is able to water, and care for some  of her plants. Pt. Has more difficulty with plants that are harder to reach. 04/07/2022: Pt. is now able to hold a cup and water her plants.02/03/2022: Pt. has difficulty caring for her plants.   Time 12  Period Weeks  Status Ongoing   OT LONG TERM GOAL  #10  TITLE Pt. will demonstrate adaptive techniques to assist with the efficiency of self-dressing, or morning care tasks.  Baseline 07/19/2022: Pt. continues to require assist with self-dressing/morning care tasks.   07/05/2022: Continue with goal.  04/07/2022: Pt. Continues to require assist with the efficiency of self-dressing, and morning care tasks.02/03/2022: Pt. requires  assist from caregivers 2/2 time limitations during morning care.   Time 12  Period Weeks  Status Ongoing   Title Pt.  will improve BUE strength to improve ADL, and IADL functioning.  Baseline 07/19/2022:  BUE strength continues to be limited. 07/05/2022: shoulder flexion: right 4-/5, abduction: 3+/5, elbow flexion: right: 5/5, left 5/5, extension: right: 4-/5, left 4-/5, wrist extension: right: 3-/5, left: 2-/5  Time 12   Period Weeks   Status Ongoing     Plan - 07/22/21 1737     Clinical Impression Statement Pt. Is tolerating Manual therapy,  there. Ex., and ROM well today. Pt. Was able to tolerate 3# dowel ex. Pt. progress update was performed last session.   Pt. continues to present with tightness at the bilateral thumb webspace, and bilateral wrist flexor tightness, as well as digit MP, PIP, and DIP extensor tightness which continues to limit her ability to complete ADL tasks efficiently. Pt. continues to benefit from working on impoving ROM in order to work towards increasing bilateral hand grasp on objects, and increase engagement of bilateral hands during ADLs, and IADL tasks.               OT Occupational Profile and History Detailed Assessment- Review of Records and additional review of physical, cognitive, psychosocial history related to current functional performance    Occupational performance deficits (Please refer to evaluation for details): ADL's;IADL's;Leisure;Social Participation    Body Structure / Function / Physical Skills ADL;Continence;Dexterity;Flexibility;Strength;ROM;Balance;Coordination;FMC;IADL;Endurance;UE functional use;Decreased knowledge of use of DME;[facility]    Psychosocial Skills Coping Strategies    Rehab Potential Fair    Clinical Decision Making Several treatment options, min-mod task modification necessary    Comorbidities Affecting Occupational Performance: Presence of comorbidities impacting occupational performance    Comorbidities impacting occupational performance description: contractures of bilateral hands, dependent transfers,    Modification or Assistance to Complete Evaluation  No modification of tasks or assist necessary to complete eval    OT Frequency 2x / week    OT Duration 12 weeks    OT Treatment/Interventions Self-care/ADL training;Cryotherapy;Therapeutic exercise;DME and/or AE instruction;Balance training;Neuromuscular education;Manual Therapy;Splinting;Moist Heat;Passive range of motion;Therapeutic activities;Patient/family education    Plan continue to progress ROM of digits, wrists and  shoulders as it pertains to completion of ADL tasks that pt values.    Consulted and Agree with Plan of Care Patient            Olegario Messier, MS, OTR/L   07/19/22, 12:13 PM

## 2022-07-21 ENCOUNTER — Ambulatory Visit: Payer: Medicare HMO | Admitting: Occupational Therapy

## 2022-07-21 ENCOUNTER — Encounter: Payer: Self-pay | Admitting: Student

## 2022-07-21 ENCOUNTER — Non-Acute Institutional Stay (SKILLED_NURSING_FACILITY): Payer: Medicare HMO | Admitting: Student

## 2022-07-21 ENCOUNTER — Ambulatory Visit: Payer: Medicare HMO

## 2022-07-21 DIAGNOSIS — K592 Neurogenic bowel, not elsewhere classified: Secondary | ICD-10-CM | POA: Diagnosis not present

## 2022-07-21 DIAGNOSIS — L299 Pruritus, unspecified: Secondary | ICD-10-CM

## 2022-07-21 DIAGNOSIS — I5089 Other heart failure: Secondary | ICD-10-CM

## 2022-07-21 DIAGNOSIS — Z978 Presence of other specified devices: Secondary | ICD-10-CM

## 2022-07-21 DIAGNOSIS — F330 Major depressive disorder, recurrent, mild: Secondary | ICD-10-CM | POA: Diagnosis not present

## 2022-07-21 DIAGNOSIS — G8250 Quadriplegia, unspecified: Secondary | ICD-10-CM

## 2022-07-21 DIAGNOSIS — I110 Hypertensive heart disease with heart failure: Secondary | ICD-10-CM | POA: Diagnosis not present

## 2022-07-21 DIAGNOSIS — M810 Age-related osteoporosis without current pathological fracture: Secondary | ICD-10-CM

## 2022-07-21 NOTE — Progress Notes (Signed)
Location:  Other [facility] [place].  Nursing Home Room Number: [facility] 219A Place of Service:  SNF [phone]) Provider:  Earnestine Mealing, MD  Patient Care Team: Earnestine Mealing, MD as PCP - General (Family Medicine)  Extended Emergency Contact Information Primary Emergency Contact: [name],[name] Mobile Phone: [phone] Relation: Daughter Secondary Emergency Contact: Volanda, Mangine Address: [address]          [place], [place] [address] [place] of [place] Home Phone: [phone] Mobile Phone: [phone] Relation: Spouse  Code Status:  DNR Goals of care: Advanced Directive information    07/21/2022   10:55 AM  Advanced Directives  Does Patient Have a Medical Advance Directive? Yes  Type of Estate agent of [place];Out of facility DNR (pink MOST or yellow form);Living will  Does patient want to make changes to medical advance directive? No - Patient declined  Copy of Healthcare Power of Attorney in Chart? Yes - validated most recent copy scanned in chart (See row information)     Chief Complaint  Patient presents with  . Medical Management of Chronic Issues    Medical Management of Chronic Issues.     HPI:  Pt is a 88 y.o. female seen today for medical management of chronic diseases.   She received her new chair!  Has had more itching on the eyes and back.   Dry area on legs -- needs vaseline.   Needs a new left leg brace- Needs [facility] to help with this. The company who made this was in [place]. She needed new straps and the folks werent' nice. [employer] in [place] -- her daughter will call to help get the new brace.   Nursing without concerns. Patient periodically wants a colon "clean out."  Past Medical History:  Diagnosis Date  . Acute blood loss anemia   . Arthritis   . Cancer (HCC)    skin  . Central cord syndrome at C6 level of cervical spinal cord (HCC) 11/29/2017  . Hypertension   . Protein-calorie malnutrition,  severe (HCC) 01/24/2018  . S/P insertion of IVC (inferior vena caval) filter 01/24/2018  . Tetraparesis [facility])    Past Surgical History:  Procedure Laterality Date  . ANTERIOR CERVICAL DECOMP/DISCECTOMY FUSION N/A 11/29/2017   Procedure: Cervical five-six, six-seven Anterior Cervical Decompression Fusion;  Surgeon: Jadene Pierini, MD;  Location: [facility] OR;  Service: Neurosurgery;  Laterality: N/A;  Cervical five-six, six-seven Anterior Cervical Decompression Fusion  . CATARACT EXTRACTION    . FEMUR IM NAIL Left 08/16/2021   Procedure: INTRAMEDULLARY (IM) NAIL FEMORAL;  Surgeon: Deeann Saint, MD;  Location: [facility] ORS;  Service: Orthopedics;  Laterality: Left;  . IR IVC FILTER PLMT / S&I /IMG GUID/MOD SED  12/08/2017    Allergies  Allergen Reactions  . Other Swelling  . Lisinopril-Hydrochlorothiazide Swelling    Swelling of the tongue  . Beef-Derived Products Swelling    Facial and lip swelling (due to tick bite)  . Dairy Aid [Tilactase] Swelling  . Lisinopril-Hydrochlorothiazide Swelling    Tongue swelling  . Pork-Derived Products Swelling    Outpatient Encounter Medications as of 07/21/2022  Medication Sig  . acetaminophen (TYLENOL) 500 MG tablet Take 500 mg by mouth every 12 (twelve) hours as needed for mild pain.  Marland Kitchen acetaminophen (TYLENOL) 650 MG CR tablet Take 650 mg by mouth every 8 (eight) hours as needed for pain.  Marland Kitchen acetic acid 0.25 % irrigation Use 30ml via irrigation twice daily to keep foley Cath patent.  Marland Kitchen alendronate (  FOSAMAX) 70 MG tablet Take 70 mg by mouth every Saturday.  . calcium carbonate (CALCIUM 600) 600 MG TABS tablet Take 600 mg by mouth at bedtime.  . Capsaicin 0.1 % CREA Apply 1 application  topically every evening. (Apply to hands)  . carboxymethylcellulose (REFRESH PLUS) 0.5 % SOLN Place 2 drops into both eyes every 6 (six) hours as needed (dry eyes).  . cetirizine (ZYRTEC) 5 MG tablet Take 5 mg by mouth daily.  . cholecalciferol (VITAMIN D3) 25 MCG (1000  UNIT) tablet Take 1,000 Units by mouth daily.  . DULoxetine (CYMBALTA) 60 MG capsule Take 60 mg by mouth daily.  . ferrous sulfate 325 (65 FE) MG tablet Take 325 mg by mouth daily with breakfast.  . gabapentin (NEURONTIN) 100 MG capsule Take 1 capsule (100 mg total) by mouth daily.  Marland Kitchen guaiFENesin (MUCINEX) 600 MG 12 hr tablet Take 600 mg by mouth 2 (two) times daily as needed for to loosen phlegm.  . Infant Care Products [facility]) OINT Apply to buttocks/gluteal folds topically every shift.  . lactulose, encephalopathy, (CHRONULAC) 10 GM/15ML SOLN Take 30 mLs by mouth daily as needed (mild constipation).  . magnesium citrate solution Take 60-120 mLs by mouth daily as needed (constipation).  . methylcellulose oral powder Take 1 packet by mouth at bedtime.  . mometasone (ELOCON) 0.1 % lotion Apply 1 Application topically every Saturday at 6 PM. (Apply to the ear canal for eczema)  . Multiple Vitamin (MULTIVITAMIN WITH MINERALS) TABS tablet Take 1 tablet by mouth daily.  . polyethylene glycol (MIRALAX / GLYCOLAX) 17 g packet Take 17 g by mouth 2 (two) times daily.  . rivaroxaban (XARELTO) 10 MG TABS tablet Take 10 mg by mouth daily.  Marland Kitchen saccharomyces boulardii (FLORASTOR) 250 MG capsule Take 250 mg by mouth daily.  . Saline Spray 0.2 % SOLN Place 2 sprays into both nostrils as needed (nasal congestion).  . simethicone (MYLICON) 80 MG chewable tablet Chew 80 mg by mouth every 6 (six) hours as needed for flatulence.  . sodium phosphate (FLEET) 7-19 GM/118ML ENEM Place 1 enema rectally every other day as needed for severe constipation.  Marland Kitchen spironolactone (ALDACTONE) 25 MG tablet Take 1 tablet (25 mg total) by mouth daily.  Marland Kitchen tiZANidine (ZANAFLEX) 2 MG tablet Take 1 tablet (2 mg total) by mouth 2 (two) times daily.  Marland Kitchen triamcinolone cream (KENALOG) 0.1 % Apply to thigh and lower legs topically every 8 hours as needed. Apply to back of neck topically as needed.  . [DISCONTINUED] EPINEPHrine 0.3 mg/0.3 mL  IJ SOAJ injection Inject 0.3 mg into the muscle as needed for anaphylaxis.   No facility-administered encounter medications on file as of 07/21/2022.    Review of Systems  Immunization History  Administered Date(s) Administered  . Influenza, High Dose Seasonal PF 11/17/2021  . Influenza-Unspecified 11/14/2018, 11/20/2019, 11/17/2020, 11/17/2021  . Moderna Covid-19 Vaccine Bivalent Booster 38yrs & up 06/30/2021, 12/11/2021  . Moderna Sars-Covid-2 Vaccination 02/09/2019, 03/09/2019, 12/14/2019, 06/20/2020, 10/24/2020  . PPD Test 05/16/2019  . Tdap 11/29/2017   Pertinent  Health Maintenance Due  Topic Date Due  . INFLUENZA VACCINE  09/02/2022  . DEXA SCAN  Discontinued      08/18/2021    8:00 PM 08/19/2021   10:00 AM 08/19/2021    8:54 PM 08/20/2021    8:20 AM 03/29/2022   10:11 AM  Fall Risk  Falls in the past year?     0  Was there an injury with Fall?  0  Fall Risk Category Calculator     0  (RETIRED) Patient Fall Risk Level High fall risk High fall risk High fall risk High fall risk   Patient at Risk for Falls Due to     No Fall Risks   Functional Status Survey:    Vitals:   07/21/22 1047  BP: 126/72  Pulse: 72  Resp: 18  Temp: 97.6 F (36.4 C)  SpO2: 96%  Weight: 171 lb 3.2 oz (77.7 kg)  Height: 5\' 7"  (1.702 m)   Body mass index is 26.81 kg/m. Physical Exam  Labs reviewed: Recent Labs    08/19/21 0333 08/20/21 0443 09/24/21 0000 03/08/22 0000 04/21/22 1200 06/02/22 0000  NA 130* 130*   < > 133* 133* 140  K 5.1 4.5   < > 4.0 3.8 4.6  CL 102 101   < > 97* 100 102  CO2 23 26   < > 28* 30 31*  GLUCOSE 117* 101*  --   --  108*  --   BUN 23 15   < > 11 8 13   CREATININE 0.72 0.54   < > 0.6 0.68 0.5  CALCIUM 7.4* 7.1*   < > 8.0* 8.0* 9.0   < > = values in this interval not displayed.   Recent Labs    09/24/21 0000 02/08/22 0000  AST  --  18  ALT  --  14  ALKPHOS  --  65  ALBUMIN 3.2* 3.7   Recent Labs    08/19/21 0333 08/20/21 0443  08/20/21 1029 02/25/22 0000 03/08/22 0000 04/21/22 1200 04/21/22 1731 04/26/22 0000 05/03/22 0000 05/10/22 0000 05/17/22 0000  WBC 10.6* 8.9   < > 5.1 6.2 7.1  --  5.7  --   --   --   NEUTROABS  --   --    < > 2,800.00 3,683.00  --   --  3,283.00  --   --   --   HGB 8.9* 7.8*   < > 10.4* 11.9* 8.5*   < > 8.3* 9.3* 10.0* 10.7*  HCT 27.5* 23.7*   < > 32* 35* 26.6*   < > 26* 28* 31* 33*  MCV 88.4 87.1  --   --   --  99.3  --   --   --   --   --   PLT 198 216   < > 318 360 348  --  444*  --   --   --    < > = values in this interval not displayed.   No results found for: "TSH" No results found for: "HGBA1C" No results found for: "CHOL", "HDL", "LDLCALC", "LDLDIRECT", "TRIG", "CHOLHDL"  Significant Diagnostic Results in last 30 days:  No results found.  Assessment/Plan There are no diagnoses linked to this encounter.   Family/ staff Communication: ***  Labs/tests ordered:  ***

## 2022-07-22 ENCOUNTER — Ambulatory Visit: Payer: Medicare HMO | Admitting: Occupational Therapy

## 2022-07-26 ENCOUNTER — Ambulatory Visit: Payer: Medicare HMO

## 2022-07-26 ENCOUNTER — Ambulatory Visit: Payer: Medicare HMO | Admitting: Occupational Therapy

## 2022-07-26 DIAGNOSIS — S14129S Central cord syndrome at unspecified level of cervical spinal cord, sequela: Secondary | ICD-10-CM

## 2022-07-26 DIAGNOSIS — M6281 Muscle weakness (generalized): Secondary | ICD-10-CM | POA: Diagnosis not present

## 2022-07-26 DIAGNOSIS — R278 Other lack of coordination: Secondary | ICD-10-CM

## 2022-07-26 DIAGNOSIS — R269 Unspecified abnormalities of gait and mobility: Secondary | ICD-10-CM

## 2022-07-26 DIAGNOSIS — S72001A Fracture of unspecified part of neck of right femur, initial encounter for closed fracture: Secondary | ICD-10-CM

## 2022-07-26 DIAGNOSIS — R2689 Other abnormalities of gait and mobility: Secondary | ICD-10-CM

## 2022-07-26 DIAGNOSIS — R262 Difficulty in walking, not elsewhere classified: Secondary | ICD-10-CM

## 2022-07-26 NOTE — Therapy (Addendum)
Occupational Therapy Neuro Treatment Note     Patient Name: Sherry [name] MRN: [id number] DOB:29-Jul-1934, 88 y.o., female Today's Date: 07/26/2022                                         PCP: Dr. Tillman Abide REFERRING PROVIDER: Dr. Tillman Abide   OT End of Session - 07/26/22 1311     Visit Number [id number]    Number of Visits 96    Date for OT Re-Evaluation 09/27/22    OT Start Time 1100    OT Stop Time 1145    OT Time Calculation (min) 45 min    Activity Tolerance Patient tolerated treatment well    Behavior During Therapy [facility] for tasks assessed/performed                  Past Medical History:  Diagnosis Date   Acute blood loss anemia    Arthritis    Cancer (HCC)    skin   Central cord syndrome at C6 level of cervical spinal cord (HCC) 11/29/2017   Hypertension    Protein-calorie malnutrition, severe (HCC) 01/24/2018   S/P insertion of IVC (inferior vena caval) filter 01/24/2018   Tetraparesis (HCC)    Past Surgical History:  Procedure Laterality Date   ANTERIOR CERVICAL DECOMP/DISCECTOMY FUSION N/A 11/29/2017   Procedure: Cervical five-six, six-seven Anterior Cervical Decompression Fusion;  Surgeon: Jadene Pierini, MD;  Location: [facility] OR;  Service: Neurosurgery;  Laterality: N/A;  Cervical five-six, six-seven Anterior Cervical Decompression Fusion   CATARACT EXTRACTION     FEMUR IM NAIL Left 08/16/2021   Procedure: INTRAMEDULLARY (IM) NAIL FEMORAL;  Surgeon: Deeann Saint, MD;  Location: [facility] ORS;  Service: Orthopedics;  Laterality: Left;   IR IVC FILTER PLMT / S&I /IMG GUID/MOD SED  12/08/2017   Patient Active Problem List   Diagnosis Date Noted   Hypertensive heart disease with other congestive heart failure (HCC) 02/14/2022   Chronic indwelling Foley catheter 02/14/2022   Closed hip fracture (HCC) 08/15/2021   DVT (deep venous thrombosis) (HCC) 08/15/2021   Quadriplegia (HCC) 08/15/2021   Fall 08/15/2021   Constipation due to slow transit 08/31/2018    Trauma 06/05/2018   Neuropathic pain 06/05/2018   Neurogenic bowel 06/05/2018   Vaginal yeast infection 01/30/2018   Healthcare-associated pneumonia 01/25/2018   Chronic allergic rhinitis 01/24/2018   Depression with anxiety 01/24/2018   UTI due to Klebsiella species 01/24/2018   Protein-calorie malnutrition, severe (HCC) 01/24/2018   S/P insertion of IVC (inferior vena caval) filter 01/24/2018   Tetraparesis (HCC) 01/20/2018   Neurogenic bladder 01/20/2018   Reactive depression    Benign essential HTN    Acute postoperative anemia due to expected blood loss    Central cord syndrome at C6 level of cervical spinal cord (HCC) 11/29/2017   Allergy to alpha-gal 11/25/2016   SCC (squamous cell carcinoma) 04/08/2014    REFERRING DIAG: Central Cord Syndrome at C6 of the Cervical Spinal Cord, Fall with Bilateral Closed Hip Fractures, with ORIF repair with Intramedullary Nailing of the Left Hip Fracture.  THERAPY DIAG:  Muscle weakness (generalized)  Rationale for Evaluation and Treatment Rehabilitation  PERTINENT HISTORY: Patient s/p fall November 29, 2017 resulting in diagnosis of central cord syndrome at C 6 level.  She has had therapy in multiple venues but with recent move to [facility], therapy staff recommended she seek outpatient therapy  for her needs. Pt. Had a recent fall with Bilateral Closed Hip Fractures, with ORIF repair with Intramedullary Nailing of the Left Hip Fracture.   PRECAUTIONS: None  SUBJECTIVE:   Pt reports washing her face and combing her hair without assist.   PAIN:  Are you having pain? No  OBJECTIVE:    Today's Treatment:   Manual Therapy:   Pt. tolerated soft tissue massage to the volar, and dorsal surface of each digit on the bilateral hands, and thenar eminence secondary to stiffness following moist heat modality. Soft tissue mobilization was performed for Carpal, and metacarpal spread stretches. Manual therapy was performed independent of, and in  preparation for therapeutic Ex.   Therapeutic Ex.    Pt. tolerated PROM followed by AROM bilateral wrist extension, PROM for bilateral digit MP, PIP, and DIP flexion, and extension, thumb radial, and palmar abduction. Pt. performed 3# dowel ex. For UE strengthening secondary to weakness. Bilateral shoulder flexion, chest press, circular patterns, and elbow flexion/extension were performed. Emphasis was placed on left wrist extension with moist heat modality. Pt. worked on the BUE for strengthening, and reciprocal motion using the UBE while seated for 8 min. with no resistance. Constant monitoring was provided.    PATIENT EDUCATION: RUE Distal ROM, and strength Person educated: Patient Education method: Explanation and Demonstration Education comprehension: verbalized understanding, returned demonstration, and needs further education   HOME EXERCISE PROGRAM Continue with ongoing HEPs  Measurements:  12/09/2021:  Shoulder flexion: R: 120(136), L: 138(150) Shoulder abduction: R: 108(120), L: 110(120) Elbow: R: 0-148, L: 0-146 Wrist flexion: R: 55(62), L: 78 Writ extension: R: 34(55), L: 3(4) RD: R: 12(20), L: 16(26) UD: R: 8(24), L: 6(18) Thumb radial abduction: R: 11(20), L: 8(20) Digit flexion to the [facility]: R:  2nd: 5cm(3.5cm), 3rd: 7.5 cm(5cm), 4th: 7cm(3cm), 5th: 6cm(5.5cm) L: 2nd: 8cm(8cm), 3rd: 8cm(8cm), 4th: 7.5cm(7cm), 5th: 7cm(7cm)  02/03/2022:   Shoulder flexion: R: 122(138), L: 148(154) Shoulder abduction: R: 109(125), L: 125(125 Elbow: R: 0-148, L: 0-146 Wrist flexion: R: 60(68), L: 79 Writ extension: R: 40(55), L: 3(10) RD: R: 14(24), L: 20(28) UD: R: 8(24), L: 6(18) Thumb radial abduction: R: 20(25), L: 8(20) Digit flexion to the [facility]: R:  2nd: 4.5cm(3cm), 3rd: 6.5cm(4.5cm), 4th: 6.5 cm(3cm), 5th: 4.5cm(5cm) L: 2nd: 8cm(8cm), 3rd: 8cm(7cm), 4th: 7.5cm(7cm), 5th: 6.5cm(6.5cm)   04/07/2022:   Shoulder flexion: R: 125(150), L: 160(160) Shoulder abduction: R:  109(125), L: 125(125 Elbow: R: 0-148, L: 0-146 Wrist flexion: R: 60(68), L: 79 Writ extension: R: 40(55), L: 5(18) RD: R: 14(24), L: 20(28) UD: R: 18(24), L: 8(18) Thumb radial abduction: R: 20(25), L: 8(20) Digit flexion to the [facility]: R:  2nd: 4.5 cm(4 cm), 3rd: 6.5cm(3cm), 4th: 7 cm (5cm), 5th: 5cm(5cm) L: 2nd: 8cm(8cm), 3rd: 8cm(7cm), 4th: 7.5cm(7cm), 5th: 7cm(7cm)   07/05/2022:   Shoulder flexion: R: 125(154), L: 160(160) Shoulder abduction: R: 95(130), L: 143(143) Elbow: R: 0-148, L: 0-146 Wrist flexion: R: 60(68), L: 79 Writ extension: R: 40(60), L: 0(12) RD: R: 10(24), L: 12(20) UD: R: 16(24), L: 8(16) Thumb radial abduction: R: 20(25), L: 8(20) Digit flexion to the [facility]: R:  2nd: 4 cm(4cm), 3rd: 6.5cm( 3.5cm), 4th: 7 cm (5cm), 5th: 6cm(5cm) L: 2nd: 8cm(8cm), 3rd: 8cm(7cm), 4th: 8cm(7cm), 5th: 7cm(7cm)            OT Long Term Goals - 07/06/21 1106  Target Date:  09/27/2022      OT LONG TERM GOAL #1   Title Patient and caregiver will demonstrate understanding of home exercise program for ROM.    Baseline 12/09/2021: Pt. Reports the restorative ROM program has not resumed at the facility. 10/28/2021: Pt. Is attempting exercises/stretches at home. The facility restorative aides have not resumed exercises. 09/14/2021:  Pt. Reports inconsistencies with restorative ROM at  her care home. Pt/caregiver education to be provided about ongoing ROM needs.    Time 12    Period Weeks    Status Deferred     OT LONG TERM GOAL #2   Title Pt.  will improve BUE strength to be able to sustain her UE's in elevation to efficiently, and effectively perform hair care, and self-grooming tasks.   Baseline 02/03/2022: Pt. has improved, and is now able to sustain her Bilateral UEs in elevation long enough to efficiently, and effectively perform haircare, and self-grooming tasks. 12/09/2021: right shoulder 4-/5,  left shoulder 4/5. Pt. has improved to be able to reach up for hair care, and self-grooming tasks. 10/28/2021: Right shoulder 3+/5, Left shoulder 4-/5 09/14/2021:  BUE shoulder strength: 3+/5 for shoulder flexion, and abduction.   Time 12    Period Weeks    Status Achieved               OT LONG TERM GOAL #3   Title Patient will demonstrate improved composite finger flexion to be able to firmly hold  and use adaptive devices during ADLs, and IADLs.   Baseline 07/05/2022: Pt. presents with digit MP, PIP, and DIP extensor tightness limiting her ability to securely grip objects in her bilateral hands.  04/07/2022: Pt. Has improved with right 2nd, and 3rd digit flexion towards the [facility]. Pt. Continues to have difficulty securely holding and applying deodorant.02/03/2022: Pt. Has improved with digit flexion, however continues to have difficulty securely holding and using deoderant. 12/09/2021: Bilateral hand/digit MP, PIP, and DIP extension tightness limits her ability achieve digit flexion to hold, and apply deodorant. 10/28/2021:  Pt. continues to have difficulty holding the deodorant. 01/12/2021: Pt. Presents with limited digit extension. Pt. Is able to initiate holding deodorant, however is unable to hold it while using it.   Time 12    Period Weeks    Status Revised   Target Date                  OT LONG TERM GOAL #4   Title Pt. Will improve bilateral wrist extension in preparation for anticipating, and initiating reaching for objects at the table.    Baseline 07/19/2022: Bilateral wrist extension in limited. 07/05/2022: Right: 40(60) Left: 0(12) 04/07/2022: Rght: 40(55) Left: 5(18) 02/03/2022: Right: 34(55) Left: 3(4) 12/09/2021: Right  34(55)  Left  3(4) 10/28/2021:  Right 22(38), Left 0(15) 09/14/2021:  Right: 17(35), left 2(15)   Time 12   Period weeks   Status Ongoing   Target Date      OT LONG TERM GOAL #5   Title Pt. will write, and sign her name with 100% legibility, and modified independence.     Baseline 07/19/2022:  Pt. continues to be able to consistently fill out daily meal menus, and puzzles. 6/03/204: Pt. Is consistently filling out  daily meal menus, and puzzles. 04/07/2022: Pt. Reports that she continues to present with shaky appearing writing. Pt.reports that she is writing more when filling out her menu, and complete puzzles 02/03/2022: Pt. Continues to report legibility appears "shaky" when attempting signing  her name. Pt.reports that she is writing more when filling out her menu, and complete puzzles.12/09/2021: Pt. Reports legibility appears "shaky" when attempting signing her name. Pt.reports that she is writing more when filling out her menu. 10/28/2021: pt. is able to sign her name, however legibility continues to be inconsistent. Pt. Reports "shaky appearance". 09/14/2021: Pt. continues to fill out her daily menu and complete puzzles. Pt. Continues to present with limited writing legibility.   Time 12    Period Weeks    Status  Ongoing         OT LONG TERM GOAL  #6   TITLE Pt. will turn pages in a book with modified independence.    Baseline 02/03/2022: Pt. Is able to effectively turn pages of a book. 10/28/2021: Pt. Is able to consistently turn book pages. 09/14/2021: Pt. has difficulty turning pages.    Time 12    Period Weeks    Status Achieved       OT LONG TERM GOAL  #7  TITLE Pt. Will increase  bilateral lateral pinch strength by 2 lbs to be able to securely grasp items during ADLs, and IADL tasks.   Baseline 07/19/2022: TBD 07/05/2022: TBD 07/2022: NT-Pinch meter out for calibration. 02/03/2022: Right: 6#, Left: 4# 12/09/2021:  Right: 6#, Left: 4#   Time 12  Period Weeks  Status Ongoing           OT LONG TERM GOAL  #8  TITLE Pt. Will button a sweater with large 1" buttons, and zip her jacket with modified independence.   Baseline 06/07/2022: Pt. is able to use a button hook to button a sweater. Pt. Reports that she does not wear button down clothing often.  04/07/2022:  Pt. Has difficulty manipulating buttons, however was introduced to a buttonhook. Pt. Was able to hold, and use a buttonhook for buttons on a shirt. 02/03/2022: Pt. is unable to manipulate buttons on her sweater, or zippers on her jacket.  Time 12  Period Weeks  Status Achieved   OT LONG TERM GOAL  #9  TITLE Pt. will complete plant care with modified independence.  Baseline 07/19/2022: Pt. Continues to be able to water, and care for some  of her plants. Pt. Has more difficulty with plants that are harder to reach. 07/05/2022: pt. Is able to water, and care for some  of her plants. Pt. Has more difficulty with plants that are harder to reach. 04/07/2022: Pt. is now able to hold a cup and water her plants.02/03/2022: Pt. has difficulty caring for her plants.   Time 12  Period Weeks  Status Ongoing   OT LONG TERM GOAL  #10  TITLE Pt. will demonstrate adaptive techniques to assist with the efficiency of self-dressing, or morning care tasks.  Baseline 07/19/2022: Pt. continues to require assist with self-dressing/morning care tasks.   07/05/2022: Continue with goal.  04/07/2022: Pt. Continues to require assist with the efficiency of self-dressing, and morning care tasks.02/03/2022: Pt. requires assist from caregivers 2/2 time limitations during morning care.   Time 12  Period Weeks  Status Ongoing   Title Pt.  will improve BUE strength to improve ADL, and IADL functioning.  Baseline 07/19/2022:  BUE strength continues to be limited. 07/05/2022: shoulder flexion: right 4-/5, abduction: 3+/5, elbow flexion: right: 5/5, left 5/5, extension: right: 4-/5, left 4-/5, wrist extension: right: 3-/5, left: 2-/5  Time 12   Period Weeks   Status Ongoing     Plan - 07/22/21 1737  Clinical Impression Statement Pt. Continues to  tolerate Manual therapy, there. Ex., and ROM well today. Pt. Was able to tolerate 3# dowel ex. Pt. progress update was performed last session.   Pt. continues to present with tightness at the  bilateral thumb webspace, and bilateral wrist flexor tightness, as well as digit MP, PIP, and DIP extensor tightness which continues to limit her ability to complete ADL tasks efficiently. Pt. continues to benefit from working on impoving ROM in order to work towards increasing bilateral hand grasp on objects, and increase engagement of bilateral hands during ADLs, and IADL tasks.               OT Occupational Profile and History Detailed Assessment- Review of Records and additional review of physical, cognitive, psychosocial history related to current functional performance    Occupational performance deficits (Please refer to evaluation for details): ADL's;IADL's;Leisure;Social Participation    Body Structure / Function / Physical Skills ADL;Continence;Dexterity;Flexibility;Strength;ROM;Balance;Coordination;FMC;IADL;Endurance;UE functional use;Decreased knowledge of use of DME;[facility]    Psychosocial Skills Coping Strategies    Rehab Potential Fair    Clinical Decision Making Several treatment options, min-mod task modification necessary    Comorbidities Affecting Occupational Performance: Presence of comorbidities impacting occupational performance    Comorbidities impacting occupational performance description: contractures of bilateral hands, dependent transfers,    Modification or Assistance to Complete Evaluation  No modification of tasks or assist necessary to complete eval    OT Frequency 2x / week    OT Duration 12 weeks    OT Treatment/Interventions Self-care/ADL training;Cryotherapy;Therapeutic exercise;DME and/or AE instruction;Balance training;Neuromuscular education;Manual Therapy;Splinting;Moist Heat;Passive range of motion;Therapeutic activities;Patient/family education    Plan continue to progress ROM of digits, wrists and shoulders as it pertains to completion of ADL tasks that pt values.    Consulted and Agree with Plan of Care Patient            Olegario Messier, MS, OTR/L    07/26/22, 1:27 PM

## 2022-07-26 NOTE — Therapy (Signed)
OUTPATIENT PHYSICAL THERAPY NEURO TREATMENT   Patient Name: Sherry [name] MRN: [id number] DOB:1934-12-28, 88 y.o., female Today's Date: 07/26/2022   PCP: Sherry Mealing, MD REFERRING PROVIDER: Deeann [name]   PT End of Session - 07/26/22 1149     Visit Number 54    Number of Visits 74    Date for PT Re-Evaluation 09/27/22    Progress Note Due on Visit 60    PT Start Time 1146    PT Stop Time 1218    PT Time Calculation (min) 32 min    Activity Tolerance Patient tolerated treatment well    Behavior During Therapy [facility] for tasks assessed/performed                             Past Medical History:  Diagnosis Date   Acute blood loss anemia    Arthritis    Cancer (HCC)    skin   Central cord syndrome at C6 level of cervical spinal cord (HCC) 11/29/2017   Hypertension    Protein-calorie malnutrition, severe (HCC) 01/24/2018   S/P insertion of IVC (inferior vena caval) filter 01/24/2018   Tetraparesis (HCC)    Past Surgical History:  Procedure Laterality Date   ANTERIOR CERVICAL DECOMP/DISCECTOMY FUSION N/A 11/29/2017   Procedure: Cervical five-six, six-seven Anterior Cervical Decompression Fusion;  Surgeon: Sherry Pierini, MD;  Location: [facility] OR;  Service: Neurosurgery;  Laterality: N/A;  Cervical five-six, six-seven Anterior Cervical Decompression Fusion   CATARACT EXTRACTION     FEMUR IM NAIL Left 08/16/2021   Procedure: INTRAMEDULLARY (IM) NAIL FEMORAL;  Surgeon: Sherry Saint, MD;  Location: [facility] ORS;  Service: Orthopedics;  Laterality: Left;   IR IVC FILTER PLMT / S&I /IMG GUID/MOD SED  12/08/2017   Patient Active Problem List   Diagnosis Date Noted   Hypertensive heart disease with other congestive heart failure (HCC) 02/14/2022   Chronic indwelling Foley catheter 02/14/2022   Closed hip fracture (HCC) 08/15/2021   DVT (deep venous thrombosis) (HCC) 08/15/2021   Quadriplegia (HCC) 08/15/2021   Fall 08/15/2021   Constipation due to slow  transit 08/31/2018   Trauma 06/05/2018   Neuropathic pain 06/05/2018   Neurogenic bowel 06/05/2018   Vaginal yeast infection 01/30/2018   Healthcare-associated pneumonia 01/25/2018   Chronic allergic rhinitis 01/24/2018   Depression with anxiety 01/24/2018   UTI due to Klebsiella species 01/24/2018   Protein-calorie malnutrition, severe (HCC) 01/24/2018   S/P insertion of IVC (inferior vena caval) filter 01/24/2018   Tetraparesis (HCC) 01/20/2018   Neurogenic bladder 01/20/2018   Reactive depression    Benign essential HTN    Acute postoperative anemia due to expected blood loss    Central cord syndrome at C6 level of cervical spinal cord (HCC) 11/29/2017   Allergy to alpha-gal 11/25/2016   SCC (squamous cell carcinoma) 04/08/2014    ONSET DATE: 08/15/2021 (fall with B hip fx); Initial injury was in 2019- quadraplegia due to central cord syndrome of C6 secondary to neck fracture.   REFERRING DIAG: Bilateral Hip fx; Left Open- s/p Left IM nail femoral on 08/16/2021; Right closed  THERAPY DIAG:  Muscle weakness (generalized)  Other lack of coordination  Central cord syndrome, sequela (HCC)  Abnormality of gait and mobility  Difficulty in walking, not elsewhere classified  Closed fracture of both hips, initial encounter (HCC)  Other abnormalities of gait and mobility  Rationale for Evaluation and Treatment Rehabilitation  SUBJECTIVE:   SUBJECTIVE STATEMENT:   Patient  reports having an uneventful weekend. Denies any pain  or falls.    Pt accompanied by:  Sherry caregiver-   PERTINENT HISTORY: Patient was previous patient (see previous episode for specifics) She experienced a fall at home on 08/15/2021 and was in Hospital from 08/15/2021- 08/20/2021.  Most recent history per Sherry [name] on 08/20/2021.  Sherry [name] is a 88 y.o. female with medical history significant of quadriplegia due to central cord syndrome of C6 secondary to neck fracture, hypertension,  depression with anxiety, neurogenic bladder, bilateral DVT on Xarelto (s/p of IVC filter placement), who presents with fall and hip pain.   Patient has history of quadriplegia, and has been doing PT/OT with improvement of function. Pt has some use of both arms and can feed herself. She can walk a little bit with assistance. Pt accidentally fell at about 930 this morning after taking shower.  She injured her hips. Hospital Course:    Closed left hip fracture [facility]) s/p mechanical fall Closed right hip fracture (?old) Acute blood loss anemia --pt is s/p  INTRAMEDULLARY (IM) NAIL FEMORAL-left on 7/16  PAIN:  Are you having pain? NO: NPRS scale: 0/10 Pain location: Right hip Pain description: Ache Aggravating factors: any active Movement of right side Relieving factors: Rest  PRECAUTIONS: Fall  WEIGHT BEARING RESTRICTIONS Yes Bilateral non-weight bearing B LE's  FALLS: Has patient fallen in last 6 months? Yes. Number of falls 1  LIVING ENVIRONMENT: Lives with: lives with their family and lives in a nursing home Lives in: Other Long term care at [facility] Stairs: No Has following equipment at home: Wheelchair (power), Ramped entry, and hoyer lift  PLOF: Needs assistance with ADLs, Needs assistance with homemaking, Needs assistance with gait, Needs assistance with transfers, and Leisure: Work puzzles, read, Watch TV  PATIENT GOALS Patient goal is to walk; Be able to strengthen my arms for better use  OBJECTIVE:   DIAGNOSTIC FINDINGS: Overall cognitive statusEXAM: DG HIP (WITH OR WITHOUT PELVIS) 2-3V LEFT; DG HIP (WITH OR WITHOUT PELVIS) 2-3V RIGHT   COMPARISON:  CT of the abdomen and pelvis 01/21/2020   FINDINGS: Left proximal femur fracture noted just below the intertrochanteric region. Marked ventral angulation noted on the cross-table image. Slight varus angulation present   Comminuted intratrochanteric fracture present on the right also with marked ventral angulation.    IMPRESSION: 1. Bilateral hip fractures. 2. Comminuted intertrochanteric fracture of the right hip. 3. Angulated fracture of the proximal left femur just below the intertrochanteric region.     Electronically Signed   By: Sherry [name] M.D.   On: 08/15/2021 11:54:   COGNITION:  Within functional limits for tasks assessed   SENSATION: Light touch: Impaired   EDEMA:  None  Strength R/L 2-/2- Shoulder flexion (anterior deltoid/pec major/coracobrachialis, axillary n. (C5-6) and musculocutaneous n. (C5-7)) 2-/3+ (within available ROM around 130 on lef)Shoulder abduction (deltoid/supraspinatus, axillary/suprascapular n, C5) 3+/3+ Shoulder external rotation (infraspinatus/teres minor) 4/4 Shoulder internal rotation (subcapularis/lats/pec major) 3+/3+ Shoulder extension (posterior deltoid, lats, teres major, axillary/thoracodorsal n.) 3+/3+ Shoulder horizontal abduction 4/4 Elbow flexion (biceps brachii, brachialis, brachioradialis, musculoskeletal n, C5-6) 4/4 Elbow extension (triceps, radial n, C7) 2-/2- Wrist Extension 3+/3+ Wrist Flexion   LOWER EXTREMITY MMT:    MMT Right 01/11/22 Left 01/11/22  Hip flexion 2- 2-  Hip extension 2- 2-  Hip abduction 2- 2-  Hip adduction 2- 3  Hip internal rotation 2- 2-  Hip external rotation 2- 2-  Knee flexion 2- 2-  Knee extension  2+ 3+  Ankle dorsiflexion 2- NT - boot  Ankle plantarflexion 2- NT- boot  Ankle inversion 2- NT- boot  Ankle eversion 2- NT- boot  (Blank rows = not tested)  BED MOBILITY:  Unable to test due to NWB status of BLE- Patient currently dependent with all bed mobility using hoyer and caregiver assist  TRANSFERS: Currently BLE NWB- Dependent on use of hoyer for all transfers  FUNCTIONAL TESTs:   PATIENT SURVEYS:  FOTO 12  TODAY'S TREATMENT:    THEREX:   PROM to left knee/ankle flex/ext  in seated position x several minutes  AROM for Left LE- Knee ext with 3 sec hold 2 sets of 10 reps  and RLE- instructed to raise as high as possible 2 sets of 10 reps.   Hip abd/add AROM 2 sets of 10 reps  AROM hip march x 10 reps each LE  Sit to stand x 3 trials- Max assist with blocking right knee /foot as patient with tendency to kick left LE out- Difficulty standing without extreme hyperextension and increased genu valgus of left knee.    Static standing x 3 trials- each trial between 15- 40 sec- Increased difficulty accepting weight through LE and more Physical assist required to maintain standing today.   Patient required rest breaks between trials to recover.       Pt educated throughout session about proper posture and technique with exercises. Improved exercise technique, movement at target joints, use of target muscles after min to mod verbal, visual, tactile cues.   PATIENT EDUCATION: Education details: PT plan of care/use of resistive bands Person educated: Patient Education method: Explanation, Demonstration, Tactile cues, and Verbal cues Education comprehension: verbalized understanding, returned demonstration, verbal cues required, tactile cues required, and needs further education   HOME EXERCISE PROGRAM: No changes at this time  Access Code: [facility] URL: [email or web address] Date: 11/23/2021 Prepared by: Precious Bard Exercises - Seated Gluteal Sets - 2 x daily - 7 x weekly - 2 sets - 10 reps - 5 hold - Seated Quad Set - 2 x daily - 7 x weekly - 2 sets - 10 reps - 5 hold - Seated Long Arc Quad - 2 x daily - 7 x weekly - 2 sets - 10 reps - 5 hold - Seated March - 2 x daily - 7 x weekly - 2 sets - 10 reps - 5 hold - Seated Hip Abduction - 2 x daily - 7 x weekly - 2 sets - 10 reps - 5 hold - Seated Shoulder Shrugs - 2 x daily - 7 x weekly - 2 sets - 10 reps - 5 hold - Wheelchair Pressure Relief - 2 x daily - 7 x weekly - 2 sets - 10 reps - 5 hold  GOALS: Goals reviewed with patient? Yes  SHORT TERM GOALS: Target date: 02/22/2022  Pt will be independent  with initial UE strengthening HEP in order to improve strength and balance in order to decrease fall risk and improve function at home and work. Baseline: 10/19/2021- patient with no formal UE HEP; 12/02/2021= Patient verbalized knowledge of HEP including use of theraband for UE strengthening.  Goal status: GOAL MET  LONG TERM GOALS: Target date: 09/27/2022  Pt will be independent with final for UE/LE HEP in order to improve strength and balance in order to decrease fall risk and improve function at home and work. Baseline: Patient is currently BLE NWB and unable to participate in HEP. Has order for UE strengthening. 01/11/2022- Patient  now able to participate in LE strengthening although NWB still- good understanding for some basic exercises. Will keep goal active to incorporate progressive LE strengthening exercises. 04/07/2022= Patient still participating in progressive LE seated HEP and has no questions at this time.  Goal status: MET  2.  Pt will improve FOTO to target score of 35  to display perceived improvements in ability to complete ADL's.  Baseline: 10/19/2021= 12; 12/02/2021= 17; 01/11/2022=17; 04/07/2022= 17; 07/05/2022=17 Goal status: Ongoing  3.  Pt will increase strength of B UE  by at least 1/2 MMT grade in order to demonstrate improvement in strength and function  Baseline: patient has range of 2-/5 to 4/5 BUE Strength; 12/02/2021= 4/5 except with wrist ext. 01/11/2022= 4/5 B UE strength expept for wrist Goal status: Goal revised-no longer appropriate- working with OT on all UE strengthening.   4. Pt. Will increase strength of RLE by at least 1/2 MMT grade in order to demonstrate improvement in Standing/transfers.  Baseline: 2-/5 Right hip flex/knee ext/flex; 04/07/2022= 2- with right hip flex/knee ext (lacking 28 deg from zero) 4/8= Patient able to ext right knee lacking 18 deg from zero. 07/05/2022=Left knee approx 8 deg from zero and right knee = 23 deg from zero  Goal status:  Progressing  5. Pt. Will demo ability to stand pivot transfer with max assist for improved functional mobility and less dependent need on mechanical device.   Baseline: Dependent on hoyer lift for all transfers; 04/07/2022- Unable to test secondary to patient with recent UTI and right hand procedure - will attempt to reassess next visit. ; 04/12/22: Unable to successfully stand due to feet plates on loaned power w/c; 05/10/2022- Patient still having to use rental chair and has not received her original power w/c back to practice SPT. 07/04/2021= Patient just received new power w/c and attempted standing from 1st time today- Patient able to stand with max assist today from w/c and did not attempt to perform pivot transfer- difficulty with static standing and placing weight on right LE.   Goal status: ONGOING  6. Pt. Will demonstrate improved functional LE strength as seen by ability to stand > 2 min for improved transfer ability and pregait abilities.   Baseline: not assessed today due to recent dx: UTI; 04/07/2022- Unable to test secondary to patient with recent UTI and right hand procedure - will attempt to reassess next visit; 04/12/22: Unable to successfully stand due to feet plates on loaned power w/c4/09/2022- Patient still having to use rental chair and has not received her original power w/c back to practice Standing. 07/05/2022- Patient able to stand with max assist at trunk today for 2 min 3 sec. Will keep goal active to ensure consistency.   Goal status: ONGOING  ASSESSMENT:  CLINICAL IMPRESSION: Patient continued with progressive standing yet limited by increased overall weakness today. She required more assist to stand and remain standing exhibiting overall LE/core/ UE weakness. Patient stated she felt weaker too. Will continue to modify treatment as needed and attempt to continue to progress overall strength. Pt will continue to benefit from skilled PT services to optimize independence and reduced  caregiver support.    OBJECTIVE IMPAIRMENTS Abnormal gait, decreased activity tolerance, decreased balance, decreased coordination, decreased endurance, decreased mobility, difficulty walking, decreased ROM, decreased strength, hypomobility, impaired flexibility, impaired UE functional use, postural dysfunction, and pain.   ACTIVITY LIMITATIONS carrying, lifting, bending, sitting, standing, squatting, sleeping, stairs, transfers, bed mobility, continence, bathing, toileting, dressing, self feeding, reach over head, hygiene/grooming,  and caring for others  PARTICIPATION LIMITATIONS: meal prep, cleaning, laundry, medication management, personal finances, interpersonal relationship, driving, shopping, community activity, and yard work  PERSONAL FACTORS Age, Time since onset of injury/illness/exacerbation, and 1-2 comorbidities: HTN, cervical Sx  are also affecting patient's functional outcome.   REHAB POTENTIAL: Good  CLINICAL DECISION MAKING: Evolving/moderate complexity  EVALUATION COMPLEXITY: Moderate  PLAN: PT FREQUENCY: 1-2x/week  PT DURATION: 12 weeks  PLANNED INTERVENTIONS: Therapeutic exercises, Therapeutic activity, Neuromuscular re-education, Balance training, Gait training, Patient/Family education, Self Care, Joint mobilization, DME instructions, Dry Needling, Electrical stimulation, Wheelchair mobility training, Spinal mobilization, Cryotherapy, Moist heat, and Manual therapy  PLAN FOR NEXT SESSION: Continue with weight bearing activities, hoyer over to mat table and practice standing next visit and LE strengthening as appropriate.   Louis Meckel, PT Physical Therapist- [facility]  07/26/2022, 5:09 PM

## 2022-07-27 DIAGNOSIS — F330 Major depressive disorder, recurrent, mild: Secondary | ICD-10-CM | POA: Insufficient documentation

## 2022-07-27 DIAGNOSIS — M810 Age-related osteoporosis without current pathological fracture: Secondary | ICD-10-CM | POA: Insufficient documentation

## 2022-07-28 ENCOUNTER — Ambulatory Visit: Payer: Medicare HMO | Admitting: Occupational Therapy

## 2022-07-28 ENCOUNTER — Ambulatory Visit: Payer: Medicare HMO

## 2022-07-28 DIAGNOSIS — M6281 Muscle weakness (generalized): Secondary | ICD-10-CM

## 2022-07-28 DIAGNOSIS — R278 Other lack of coordination: Secondary | ICD-10-CM

## 2022-07-28 DIAGNOSIS — R269 Unspecified abnormalities of gait and mobility: Secondary | ICD-10-CM

## 2022-07-28 DIAGNOSIS — S14129S Central cord syndrome at unspecified level of cervical spinal cord, sequela: Secondary | ICD-10-CM

## 2022-07-28 NOTE — Therapy (Signed)
Occupational Therapy Neuro Treatment Note     Patient Name: Sherry [name] MRN: [id number] DOB:28-Sep-1934, 88 y.o., female Today's Date: 07/28/2022                                         PCP: Dr. Tillman Abide REFERRING PROVIDER: Dr. Tillman Abide   OT End of Session - 07/28/22 1313     Visit Number [id number]    Number of Visits 96    Date for OT Re-Evaluation 09/27/22    Authorization Time Period Progress report period starting 12/14/2021    OT Start Time 1100    OT Stop Time 1145    OT Time Calculation (min) 45 min    Activity Tolerance Patient tolerated treatment well    Behavior During Therapy [facility] for tasks assessed/performed                  Past Medical History:  Diagnosis Date   Acute blood loss anemia    Arthritis    Cancer (HCC)    skin   Central cord syndrome at C6 level of cervical spinal cord (HCC) 11/29/2017   Hypertension    Protein-calorie malnutrition, severe (HCC) 01/24/2018   S/P insertion of IVC (inferior vena caval) filter 01/24/2018   Tetraparesis (HCC)    Past Surgical History:  Procedure Laterality Date   ANTERIOR CERVICAL DECOMP/DISCECTOMY FUSION N/A 11/29/2017   Procedure: Cervical five-six, six-seven Anterior Cervical Decompression Fusion;  Surgeon: Jadene Pierini, MD;  Location: [facility] OR;  Service: Neurosurgery;  Laterality: N/A;  Cervical five-six, six-seven Anterior Cervical Decompression Fusion   CATARACT EXTRACTION     FEMUR IM NAIL Left 08/16/2021   Procedure: INTRAMEDULLARY (IM) NAIL FEMORAL;  Surgeon: Deeann Saint, MD;  Location: [facility] ORS;  Service: Orthopedics;  Laterality: Left;   IR IVC FILTER PLMT / S&I /IMG GUID/MOD SED  12/08/2017   Patient Active Problem List   Diagnosis Date Noted   Age-related osteoporosis without current pathological fracture 07/27/2022   Mild recurrent major depression (HCC) 07/27/2022   Hypertensive heart disease with other congestive heart failure (HCC) 02/14/2022   Chronic indwelling Foley  catheter 02/14/2022   Closed hip fracture (HCC) 08/15/2021   DVT (deep venous thrombosis) (HCC) 08/15/2021   Quadriplegia (HCC) 08/15/2021   Fall 08/15/2021   Constipation due to slow transit 08/31/2018   Trauma 06/05/2018   Neuropathic pain 06/05/2018   Neurogenic bowel 06/05/2018   Vaginal yeast infection 01/30/2018   Healthcare-associated pneumonia 01/25/2018   Chronic allergic rhinitis 01/24/2018   Depression with anxiety 01/24/2018   UTI due to Klebsiella species 01/24/2018   Protein-calorie malnutrition, severe (HCC) 01/24/2018   S/P insertion of IVC (inferior vena caval) filter 01/24/2018   Tetraparesis (HCC) 01/20/2018   Neurogenic bladder 01/20/2018   Reactive depression    Benign essential HTN    Acute postoperative anemia due to expected blood loss    Central cord syndrome at C6 level of cervical spinal cord (HCC) 11/29/2017   Allergy to alpha-gal 11/25/2016   SCC (squamous cell carcinoma) 04/08/2014    REFERRING DIAG: Central Cord Syndrome at C6 of the Cervical Spinal Cord, Fall with Bilateral Closed Hip Fractures, with ORIF repair with Intramedullary Nailing of the Left Hip Fracture.  THERAPY DIAG:  Muscle weakness (generalized)  Other lack of coordination  Rationale for Evaluation and Treatment Rehabilitation  PERTINENT HISTORY: Patient s/p fall November 29, 2017  resulting in diagnosis of central cord syndrome at C 6 level.  She has had therapy in multiple venues but with recent move to [facility], therapy staff recommended she seek outpatient therapy for her needs. Pt. Had a recent fall with Bilateral Closed Hip Fractures, with ORIF repair with Intramedullary Nailing of the Left Hip Fracture.   PRECAUTIONS: None  SUBJECTIVE:   Pt reports washing her face and combing her hair without assist.   PAIN:  Are you having pain? No  OBJECTIVE:    Today's Treatment:   Manual Therapy:   Pt. tolerated soft tissue massage to the volar, and dorsal surface of each  digit on the bilateral hands, and thenar eminence secondary to stiffness following moist heat modality. Soft tissue mobilization was performed for Carpal, and metacarpal spread stretches. Manual therapy was performed independent of, and in preparation for therapeutic Ex.   Therapeutic Ex.    Pt. tolerated PROM followed by AROM bilateral wrist extension, PROM for bilateral digit MP, PIP, and DIP flexion, and extension, thumb radial, and palmar abduction. Pt. performed 3# dowel ex. For UE strengthening secondary to weakness. Bilateral shoulder flexion, chest press, circular patterns, and elbow flexion/extension were performed. Emphasis was placed on left wrist extension with moist heat modality. Pt. worked on the BUE for strengthening, and reciprocal motion using the UBE while seated for 8 min. with no resistance. Constant monitoring was provided.    PATIENT EDUCATION: RUE Distal ROM, and strength Person educated: Patient Education method: Explanation and Demonstration Education comprehension: verbalized understanding, returned demonstration, and needs further education   HOME EXERCISE PROGRAM Continue with ongoing HEPs  Measurements:  12/09/2021:  Shoulder flexion: R: 120(136), L: 138(150) Shoulder abduction: R: 108(120), L: 110(120) Elbow: R: 0-148, L: 0-146 Wrist flexion: R: 55(62), L: 78 Writ extension: R: 34(55), L: 3(4) RD: R: 12(20), L: 16(26) UD: R: 8(24), L: 6(18) Thumb radial abduction: R: 11(20), L: 8(20) Digit flexion to the [facility]: R:  2nd: 5cm(3.5cm), 3rd: 7.5 cm(5cm), 4th: 7cm(3cm), 5th: 6cm(5.5cm) L: 2nd: 8cm(8cm), 3rd: 8cm(8cm), 4th: 7.5cm(7cm), 5th: 7cm(7cm)  02/03/2022:   Shoulder flexion: R: 122(138), L: 148(154) Shoulder abduction: R: 109(125), L: 125(125 Elbow: R: 0-148, L: 0-146 Wrist flexion: R: 60(68), L: 79 Writ extension: R: 40(55), L: 3(10) RD: R: 14(24), L: 20(28) UD: R: 8(24), L: 6(18) Thumb radial abduction: R: 20(25), L: 8(20) Digit flexion to the  [facility]: R:  2nd: 4.5cm(3cm), 3rd: 6.5cm(4.5cm), 4th: 6.5 cm(3cm), 5th: 4.5cm(5cm) L: 2nd: 8cm(8cm), 3rd: 8cm(7cm), 4th: 7.5cm(7cm), 5th: 6.5cm(6.5cm)   04/07/2022:   Shoulder flexion: R: 125(150), L: 160(160) Shoulder abduction: R: 109(125), L: 125(125 Elbow: R: 0-148, L: 0-146 Wrist flexion: R: 60(68), L: 79 Writ extension: R: 40(55), L: 5(18) RD: R: 14(24), L: 20(28) UD: R: 18(24), L: 8(18) Thumb radial abduction: R: 20(25), L: 8(20) Digit flexion to the [facility]: R:  2nd: 4.5 cm(4 cm), 3rd: 6.5cm(3cm), 4th: 7 cm (5cm), 5th: 5cm(5cm) L: 2nd: 8cm(8cm), 3rd: 8cm(7cm), 4th: 7.5cm(7cm), 5th: 7cm(7cm)   07/05/2022:   Shoulder flexion: R: 125(154), L: 160(160) Shoulder abduction: R: 95(130), L: 143(143) Elbow: R: 0-148, L: 0-146 Wrist flexion: R: 60(68), L: 79 Writ extension: R: 40(60), L: 0(12) RD: R: 10(24), L: 12(20) UD: R: 16(24), L: 8(16) Thumb radial abduction: R: 20(25), L: 8(20) Digit flexion to the [facility]: R:  2nd: 4 cm(4cm), 3rd: 6.5cm( 3.5cm), 4th: 7 cm (5cm), 5th: 6cm(5cm) L: 2nd: 8cm(8cm), 3rd: 8cm(7cm), 4th: 8cm(7cm), 5th: 7cm(7cm)            OT  Long Term Goals - 07/06/21 1106                                                                                          Target Date:  09/27/2022      OT LONG TERM GOAL #1   Title Patient and caregiver will demonstrate understanding of home exercise program for ROM.    Baseline 12/09/2021: Pt. Reports the restorative ROM program has not resumed at the facility. 10/28/2021: Pt. Is attempting exercises/stretches at home. The facility restorative aides have not resumed exercises. 09/14/2021:  Pt. Reports inconsistencies with restorative ROM at  her care home. Pt/caregiver education to be provided about ongoing ROM needs.    Time 12    Period Weeks    Status Deferred     OT LONG TERM GOAL #2   Title Pt.  will improve BUE strength to be able to sustain her UE's in elevation to efficiently, and effectively perform hair care, and  self-grooming tasks.   Baseline 02/03/2022: Pt. has improved, and is now able to sustain her Bilateral UEs in elevation long enough to efficiently, and effectively perform haircare, and self-grooming tasks. 12/09/2021: right shoulder 4-/5, left shoulder 4/5. Pt. has improved to be able to reach up for hair care, and self-grooming tasks. 10/28/2021: Right shoulder 3+/5, Left shoulder 4-/5 09/14/2021:  BUE shoulder strength: 3+/5 for shoulder flexion, and abduction.   Time 12    Period Weeks    Status Achieved               OT LONG TERM GOAL #3   Title Patient will demonstrate improved composite finger flexion to be able to firmly hold  and use adaptive devices during ADLs, and IADLs.   Baseline 07/05/2022: Pt. presents with digit MP, PIP, and DIP extensor tightness limiting her ability to securely grip objects in her bilateral hands.  04/07/2022: Pt. Has improved with right 2nd, and 3rd digit flexion towards the [facility]. Pt. Continues to have difficulty securely holding and applying deodorant.02/03/2022: Pt. Has improved with digit flexion, however continues to have difficulty securely holding and using deoderant. 12/09/2021: Bilateral hand/digit MP, PIP, and DIP extension tightness limits her ability achieve digit flexion to hold, and apply deodorant. 10/28/2021:  Pt. continues to have difficulty holding the deodorant. 01/12/2021: Pt. Presents with limited digit extension. Pt. Is able to initiate holding deodorant, however is unable to hold it while using it.   Time 12    Period Weeks    Status Revised   Target Date                  OT LONG TERM GOAL #4   Title Pt. Will improve bilateral wrist extension in preparation for anticipating, and initiating reaching for objects at the table.    Baseline 07/19/2022: Bilateral wrist extension in limited. 07/05/2022: Right: 40(60) Left: 0(12) 04/07/2022: Rght: 40(55) Left: 5(18) 02/03/2022: Right: 34(55) Left: 3(4) 12/09/2021: Right  34(55)  Left  3(4) 10/28/2021:  Right  22(38), Left 0(15) 09/14/2021:  Right: 17(35), left 2(15)   Time 12   Period weeks   Status Ongoing   Target Date  OT LONG TERM GOAL #5   Title Pt. will write, and sign her name with 100% legibility, and modified independence.    Baseline 07/19/2022:  Pt. continues to be able to consistently fill out daily meal menus, and puzzles. 6/03/204: Pt. Is consistently filling out  daily meal menus, and puzzles. 04/07/2022: Pt. Reports that she continues to present with shaky appearing writing. Pt.reports that she is writing more when filling out her menu, and complete puzzles 02/03/2022: Pt. Continues to report legibility appears "shaky" when attempting signing her name. Pt.reports that she is writing more when filling out her menu, and complete puzzles.12/09/2021: Pt. Reports legibility appears "shaky" when attempting signing her name. Pt.reports that she is writing more when filling out her menu. 10/28/2021: pt. is able to sign her name, however legibility continues to be inconsistent. Pt. Reports "shaky appearance". 09/14/2021: Pt. continues to fill out her daily menu and complete puzzles. Pt. Continues to present with limited writing legibility.   Time 12    Period Weeks    Status  Ongoing         OT LONG TERM GOAL  #6   TITLE Pt. will turn pages in a book with modified independence.    Baseline 02/03/2022: Pt. Is able to effectively turn pages of a book. 10/28/2021: Pt. Is able to consistently turn book pages. 09/14/2021: Pt. has difficulty turning pages.    Time 12    Period Weeks    Status Achieved       OT LONG TERM GOAL  #7  TITLE Pt. Will increase  bilateral lateral pinch strength by 2 lbs to be able to securely grasp items during ADLs, and IADL tasks.   Baseline 07/19/2022: TBD 07/05/2022: TBD 07/2022: NT-Pinch meter out for calibration. 02/03/2022: Right: 6#, Left: 4# 12/09/2021:  Right: 6#, Left: 4#   Time 12  Period Weeks  Status Ongoing           OT LONG TERM GOAL  #8  TITLE Pt.  Will button a sweater with large 1" buttons, and zip her jacket with modified independence.   Baseline 06/07/2022: Pt. is able to use a button hook to button a sweater. Pt. Reports that she does not wear button down clothing often.  04/07/2022: Pt. Has difficulty manipulating buttons, however was introduced to a buttonhook. Pt. Was able to hold, and use a buttonhook for buttons on a shirt. 02/03/2022: Pt. is unable to manipulate buttons on her sweater, or zippers on her jacket.  Time 12  Period Weeks  Status Achieved   OT LONG TERM GOAL  #9  TITLE Pt. will complete plant care with modified independence.  Baseline 07/19/2022: Pt. Continues to be able to water, and care for some  of her plants. Pt. Has more difficulty with plants that are harder to reach. 07/05/2022: pt. Is able to water, and care for some  of her plants. Pt. Has more difficulty with plants that are harder to reach. 04/07/2022: Pt. is now able to hold a cup and water her plants.02/03/2022: Pt. has difficulty caring for her plants.   Time 12  Period Weeks  Status Ongoing   OT LONG TERM GOAL  #10  TITLE Pt. will demonstrate adaptive techniques to assist with the efficiency of self-dressing, or morning care tasks.  Baseline 07/19/2022: Pt. continues to require assist with self-dressing/morning care tasks.   07/05/2022: Continue with goal.  04/07/2022: Pt. Continues to require assist with the efficiency of self-dressing, and morning care tasks.02/03/2022: Pt. requires  assist from caregivers 2/2 time limitations during morning care.   Time 12  Period Weeks  Status Ongoing   Title Pt.  will improve BUE strength to improve ADL, and IADL functioning.  Baseline 07/19/2022:  BUE strength continues to be limited. 07/05/2022: shoulder flexion: right 4-/5, abduction: 3+/5, elbow flexion: right: 5/5, left 5/5, extension: right: 4-/5, left 4-/5, wrist extension: right: 3-/5, left: 2-/5  Time 12   Period Weeks   Status Ongoing     Plan - 07/22/21 1737      Clinical Impression Statement Pt. Continues to  tolerate Manual therapy, there. Ex., and ROM well today. Pt. Was able to tolerate 3# dowel ex. Pt. continues to present with tightness at the bilateral thumb webspace, and bilateral wrist flexor tightness, as well as digit MP, PIP, and DIP extensor tightness which continues to limit her ability to complete ADL tasks efficiently. Pt. continues to benefit from working on impoving ROM in order to work towards increasing bilateral hand grasp on objects, and increase engagement of bilateral hands during ADLs, and IADL tasks.               OT Occupational Profile and History Detailed Assessment- Review of Records and additional review of physical, cognitive, psychosocial history related to current functional performance    Occupational performance deficits (Please refer to evaluation for details): ADL's;IADL's;Leisure;Social Participation    Body Structure / Function / Physical Skills ADL;Continence;Dexterity;Flexibility;Strength;ROM;Balance;Coordination;FMC;IADL;Endurance;UE functional use;Decreased knowledge of use of DME;[facility]    Psychosocial Skills Coping Strategies    Rehab Potential Fair    Clinical Decision Making Several treatment options, min-mod task modification necessary    Comorbidities Affecting Occupational Performance: Presence of comorbidities impacting occupational performance    Comorbidities impacting occupational performance description: contractures of bilateral hands, dependent transfers,    Modification or Assistance to Complete Evaluation  No modification of tasks or assist necessary to complete eval    OT Frequency 2x / week    OT Duration 12 weeks    OT Treatment/Interventions Self-care/ADL training;Cryotherapy;Therapeutic exercise;DME and/or AE instruction;Balance training;Neuromuscular education;Manual Therapy;Splinting;Moist Heat;Passive range of motion;Therapeutic activities;Patient/family education    Plan continue to  progress ROM of digits, wrists and shoulders as it pertains to completion of ADL tasks that pt values.    Consulted and Agree with Plan of Care Patient            Olegario Messier, MS, OTR/L   07/28/22, 1:18 PM

## 2022-07-29 ENCOUNTER — Ambulatory Visit: Payer: Medicare HMO | Admitting: Occupational Therapy

## 2022-07-29 NOTE — Therapy (Signed)
OUTPATIENT PHYSICAL THERAPY NEURO TREATMENT   Patient Name: Sherry [name] MRN: [id number] DOB:03-31-1934, 88 y.o., female Today's Date: 07/29/2022   PCP: Sherry Mealing, MD REFERRING PROVIDER: Deeann [name]   PT End of Session - 07/28/22 1702     Visit Number 55    Number of Visits 74    Date for PT Re-Evaluation 09/27/22    Progress Note Due on Visit 60    PT Start Time 1145    PT Stop Time 1218    PT Time Calculation (min) 33 min    Equipment Utilized During Treatment Gait belt    Activity Tolerance Patient tolerated treatment well    Behavior During Therapy WFL for tasks assessed/performed                             Past Medical History:  Diagnosis Date   Acute blood loss anemia    Arthritis    Cancer (HCC)    skin   Central cord syndrome at C6 level of cervical spinal cord (HCC) 11/29/2017   Hypertension    Protein-calorie malnutrition, severe (HCC) 01/24/2018   S/P insertion of IVC (inferior vena caval) filter 01/24/2018   Tetraparesis (HCC)    Past Surgical History:  Procedure Laterality Date   ANTERIOR CERVICAL DECOMP/DISCECTOMY FUSION N/A 11/29/2017   Procedure: Cervical five-six, six-seven Anterior Cervical Decompression Fusion;  Surgeon: Sherry Pierini, MD;  Location: [facility] OR;  Service: Neurosurgery;  Laterality: N/A;  Cervical five-six, six-seven Anterior Cervical Decompression Fusion   CATARACT EXTRACTION     FEMUR IM NAIL Left 08/16/2021   Procedure: INTRAMEDULLARY (IM) NAIL FEMORAL;  Surgeon: Sherry Saint, MD;  Location: [facility] ORS;  Service: Orthopedics;  Laterality: Left;   IR IVC FILTER PLMT / S&I /IMG GUID/MOD SED  12/08/2017   Patient Active Problem List   Diagnosis Date Noted   Age-related osteoporosis without current pathological fracture 07/27/2022   Mild recurrent major depression (HCC) 07/27/2022   Hypertensive heart disease with other congestive heart failure (HCC) 02/14/2022   Chronic indwelling Foley catheter  02/14/2022   Closed hip fracture (HCC) 08/15/2021   DVT (deep venous thrombosis) (HCC) 08/15/2021   Quadriplegia (HCC) 08/15/2021   Fall 08/15/2021   Constipation due to slow transit 08/31/2018   Trauma 06/05/2018   Neuropathic pain 06/05/2018   Neurogenic bowel 06/05/2018   Vaginal yeast infection 01/30/2018   Healthcare-associated pneumonia 01/25/2018   Chronic allergic rhinitis 01/24/2018   Depression with anxiety 01/24/2018   UTI due to Klebsiella species 01/24/2018   Protein-calorie malnutrition, severe (HCC) 01/24/2018   S/P insertion of IVC (inferior vena caval) filter 01/24/2018   Tetraparesis (HCC) 01/20/2018   Neurogenic bladder 01/20/2018   Reactive depression    Benign essential HTN    Acute postoperative anemia due to expected blood loss    Central cord syndrome at C6 level of cervical spinal cord (HCC) 11/29/2017   Allergy to alpha-gal 11/25/2016   SCC (squamous cell carcinoma) 04/08/2014    ONSET DATE: 08/15/2021 (fall with B hip fx); Initial injury was in 2019- quadraplegia due to central cord syndrome of C6 secondary to neck fracture.   REFERRING DIAG: Bilateral Hip fx; Left Open- s/p Left IM nail femoral on 08/16/2021; Right closed  THERAPY DIAG:  Muscle weakness (generalized)  Other lack of coordination  Central cord syndrome, sequela (HCC)  Abnormality of gait and mobility  Rationale for Evaluation and Treatment Rehabilitation  SUBJECTIVE:   SUBJECTIVE STATEMENT:  Patient reports her daughter is working on updating her AFO.    Pt accompanied by:  Sherry [name]-   PERTINENT HISTORY: Patient was previous patient (see previous episode for specifics) She experienced a fall at home on 08/15/2021 and was in Hospital from 08/15/2021- 08/20/2021.  Most recent history per Dr. Shonna [name] on 08/20/2021.  Sherry [name] is a 88 y.o. female with medical history significant of quadriplegia due to central cord syndrome of C6 secondary to neck fracture,  hypertension, depression with anxiety, neurogenic bladder, bilateral DVT on Xarelto (s/p of IVC filter placement), who presents with fall and hip pain.   Patient has history of quadriplegia, and has been doing PT/OT with improvement of function. Pt has some use of both arms and can feed herself. She can walk a little bit with assistance. Pt accidentally fell at about 930 this morning after taking shower.  She injured her hips. Hospital Course:    Closed left hip fracture [facility]) s/p mechanical fall Closed right hip fracture (?old) Acute blood loss anemia --pt is s/p  INTRAMEDULLARY (IM) NAIL FEMORAL-left on 7/16  PAIN:  Are you having pain? NO: NPRS scale: 0/10 Pain location: Right hip Pain description: Ache Aggravating factors: any active Movement of right side Relieving factors: Rest  PRECAUTIONS: Fall  WEIGHT BEARING RESTRICTIONS Yes Bilateral non-weight bearing B LE's  FALLS: Has patient fallen in last 6 months? Yes. Number of falls 1  LIVING ENVIRONMENT: Lives with: lives with their family and lives in a nursing home Lives in: Other Long term care at [facility] Stairs: No Has following equipment at home: Wheelchair (power), Ramped entry, and hoyer lift  PLOF: Needs assistance with ADLs, Needs assistance with homemaking, Needs assistance with gait, Needs assistance with transfers, and Leisure: Work puzzles, read, Watch TV  PATIENT GOALS Patient goal is to walk; Be able to strengthen my arms for better use  OBJECTIVE:   DIAGNOSTIC FINDINGS: Overall cognitive statusEXAM: DG HIP (WITH OR WITHOUT PELVIS) 2-3V LEFT; DG HIP (WITH OR WITHOUT PELVIS) 2-3V RIGHT   COMPARISON:  CT of the abdomen and pelvis 01/21/2020   FINDINGS: Left proximal femur fracture noted just below the intertrochanteric region. Marked ventral angulation noted on the cross-table image. Slight varus angulation present   Comminuted intratrochanteric fracture present on the right also with marked ventral  angulation.   IMPRESSION: 1. Bilateral hip fractures. 2. Comminuted intertrochanteric fracture of the right hip. 3. Angulated fracture of the proximal left femur just below the intertrochanteric region.     Electronically Signed   By: Marin Roberts M.D.   On: 08/15/2021 11:54:   COGNITION:  Within functional limits for tasks assessed   SENSATION: Light touch: Impaired   EDEMA:  None  Strength R/L 2-/2- Shoulder flexion (anterior deltoid/pec major/coracobrachialis, axillary n. (C5-6) and musculocutaneous n. (C5-7)) 2-/3+ (within available ROM around 130 on lef)Shoulder abduction (deltoid/supraspinatus, axillary/suprascapular n, C5) 3+/3+ Shoulder external rotation (infraspinatus/teres minor) 4/4 Shoulder internal rotation (subcapularis/lats/pec major) 3+/3+ Shoulder extension (posterior deltoid, lats, teres major, axillary/thoracodorsal n.) 3+/3+ Shoulder horizontal abduction 4/4 Elbow flexion (biceps brachii, brachialis, brachioradialis, musculoskeletal n, C5-6) 4/4 Elbow extension (triceps, radial n, C7) 2-/2- Wrist Extension 3+/3+ Wrist Flexion   LOWER EXTREMITY MMT:    MMT Right 01/11/22 Left 01/11/22  Hip flexion 2- 2-  Hip extension 2- 2-  Hip abduction 2- 2-  Hip adduction 2- 3  Hip internal rotation 2- 2-  Hip external rotation 2- 2-  Knee flexion 2- 2-  Knee extension 2+  3+  Ankle dorsiflexion 2- NT - boot  Ankle plantarflexion 2- NT- boot  Ankle inversion 2- NT- boot  Ankle eversion 2- NT- boot  (Blank rows = not tested)  BED MOBILITY:  Unable to test due to NWB status of BLE- Patient currently dependent with all bed mobility using hoyer and [name] assist  TRANSFERS: Currently BLE NWB- Dependent on use of hoyer for all transfers  FUNCTIONAL TESTs:   PATIENT SURVEYS:  FOTO 12  TODAY'S TREATMENT:    THERAPEUTIC ACTIVITIES:    Sit to stand x 5 trials- Max assist with left foot placement as patient with tendency to kick left LE  out- Difficulty standing without extreme hyperextension and increased genu valgus of left knee.    Static standing x 5 trials- each trial between 15 sec to 2 min. Increased difficulty initially with poor ability to extend trunk but did much better after 1st 2 trials but once she was able to stand more erect she was able to hold with actual min assist rather than initial max assist.   Patient required rest breaks between trials to recover.       Pt educated throughout session about proper posture and technique with exercises. Improved exercise technique, movement at target joints, use of target muscles after min to mod verbal, visual, tactile cues.   PATIENT EDUCATION: Education details: PT plan of care/use of resistive bands Person educated: Patient Education method: Explanation, Demonstration, Tactile cues, and Verbal cues Education comprehension: verbalized understanding, returned demonstration, verbal cues required, tactile cues required, and needs further education   HOME EXERCISE PROGRAM: No changes at this time  Access Code: [facility] URL: [email or web address] Date: 11/23/2021 Prepared by: Precious Bard Exercises - Seated Gluteal Sets - 2 x daily - 7 x weekly - 2 sets - 10 reps - 5 hold - Seated Quad Set - 2 x daily - 7 x weekly - 2 sets - 10 reps - 5 hold - Seated Long Arc Quad - 2 x daily - 7 x weekly - 2 sets - 10 reps - 5 hold - Seated March - 2 x daily - 7 x weekly - 2 sets - 10 reps - 5 hold - Seated Hip Abduction - 2 x daily - 7 x weekly - 2 sets - 10 reps - 5 hold - Seated Shoulder Shrugs - 2 x daily - 7 x weekly - 2 sets - 10 reps - 5 hold - Wheelchair Pressure Relief - 2 x daily - 7 x weekly - 2 sets - 10 reps - 5 hold  GOALS: Goals reviewed with patient? Yes  SHORT TERM GOALS: Target date: 02/22/2022  Pt will be independent with initial UE strengthening HEP in order to improve strength and balance in order to decrease fall risk and improve function at home and  work. Baseline: 10/19/2021- patient with no formal UE HEP; 12/02/2021= Patient verbalized knowledge of HEP including use of theraband for UE strengthening.  Goal status: GOAL MET  LONG TERM GOALS: Target date: 09/27/2022  Pt will be independent with final for UE/LE HEP in order to improve strength and balance in order to decrease fall risk and improve function at home and work. Baseline: Patient is currently BLE NWB and unable to participate in HEP. Has order for UE strengthening. 01/11/2022- Patient now able to participate in LE strengthening although NWB still- good understanding for some basic exercises. Will keep goal active to incorporate progressive LE strengthening exercises. 04/07/2022= Patient still participating in progressive LE seated HEP  and has no questions at this time.  Goal status: MET  2.  Pt will improve FOTO to target score of 35  to display perceived improvements in ability to complete ADL's.  Baseline: 10/19/2021= 12; 12/02/2021= 17; 01/11/2022=17; 04/07/2022= 17; 07/05/2022=17 Goal status: Ongoing  3.  Pt will increase strength of B UE  by at least 1/2 MMT grade in order to demonstrate improvement in strength and function  Baseline: patient has range of 2-/5 to 4/5 BUE Strength; 12/02/2021= 4/5 except with wrist ext. 01/11/2022= 4/5 B UE strength expept for wrist Goal status: Goal revised-no longer appropriate- working with OT on all UE strengthening.   4. Pt. Will increase strength of RLE by at least 1/2 MMT grade in order to demonstrate improvement in Standing/transfers.  Baseline: 2-/5 Right hip flex/knee ext/flex; 04/07/2022= 2- with right hip flex/knee ext (lacking 28 deg from zero) 4/8= Patient able to ext right knee lacking 18 deg from zero. 07/05/2022=Left knee approx 8 deg from zero and right knee = 23 deg from zero  Goal status: Progressing  5. Pt. Will demo ability to stand pivot transfer with max assist for improved functional mobility and less dependent need on mechanical  device.   Baseline: Dependent on hoyer lift for all transfers; 04/07/2022- Unable to test secondary to patient with recent UTI and right hand procedure - will attempt to reassess next visit. ; 04/12/22: Unable to successfully stand due to feet plates on loaned power w/c; 05/10/2022- Patient still having to use rental chair and has not received her original power w/c back to practice SPT. 07/04/2021= Patient just received new power w/c and attempted standing from 1st time today- Patient able to stand with max assist today from w/c and did not attempt to perform pivot transfer- difficulty with static standing and placing weight on right LE.   Goal status: ONGOING  6. Pt. Will demonstrate improved functional LE strength as seen by ability to stand > 2 min for improved transfer ability and pregait abilities.   Baseline: not assessed today due to recent dx: UTI; 04/07/2022- Unable to test secondary to patient with recent UTI and right hand procedure - will attempt to reassess next visit; 04/12/22: Unable to successfully stand due to feet plates on loaned power w/c4/09/2022- Patient still having to use rental chair and has not received her original power w/c back to practice Standing. 07/05/2022- Patient able to stand with max assist at trunk today for 2 min 3 sec. Will keep goal active to ensure consistency.   Goal status: ONGOING  ASSESSMENT:  CLINICAL IMPRESSION: Patient bounced back as far as demo much improved standing functional endurance today vs. Last session. She did struggle initially but vastly improved with practice focusing on LE positioning (moving left foot back as far as possible) and then able to stand nearly 2 min today. Pt will continue to benefit from skilled PT services to optimize independence and reduced [name] support.    OBJECTIVE IMPAIRMENTS Abnormal gait, decreased activity tolerance, decreased balance, decreased coordination, decreased endurance, decreased mobility, difficulty walking,  decreased ROM, decreased strength, hypomobility, impaired flexibility, impaired UE functional use, postural dysfunction, and pain.   ACTIVITY LIMITATIONS carrying, lifting, bending, sitting, standing, squatting, sleeping, stairs, transfers, bed mobility, continence, bathing, toileting, dressing, self feeding, reach over head, hygiene/grooming, and caring for others  PARTICIPATION LIMITATIONS: meal prep, cleaning, laundry, medication management, personal finances, interpersonal relationship, driving, shopping, community activity, and yard work  PERSONAL FACTORS Age, Time since onset of injury/illness/exacerbation, and 1-2 comorbidities:  HTN, cervical Sx  are also affecting patient's functional outcome.   REHAB POTENTIAL: Good  CLINICAL DECISION MAKING: Evolving/moderate complexity  EVALUATION COMPLEXITY: Moderate  PLAN: PT FREQUENCY: 1-2x/week  PT DURATION: 12 weeks  PLANNED INTERVENTIONS: Therapeutic exercises, Therapeutic activity, Neuromuscular re-education, Balance training, Gait training, Patient/Family education, Self Care, Joint mobilization, DME instructions, Dry Needling, Electrical stimulation, Wheelchair mobility training, Spinal mobilization, Cryotherapy, Moist heat, and Manual therapy  PLAN FOR NEXT SESSION: Continue with weight bearing activities, hoyer over to mat table and practice standing next visit and LE strengthening as appropriate.   Louis Meckel, PT Physical Therapist- [facility]  07/29/2022, 4:52 PM

## 2022-08-02 ENCOUNTER — Ambulatory Visit: Payer: Medicare HMO

## 2022-08-02 ENCOUNTER — Ambulatory Visit: Payer: Medicare HMO | Attending: Internal Medicine | Admitting: Occupational Therapy

## 2022-08-02 DIAGNOSIS — R140 Abdominal distension (gaseous): Secondary | ICD-10-CM | POA: Diagnosis present

## 2022-08-02 DIAGNOSIS — S72002A Fracture of unspecified part of neck of left femur, initial encounter for closed fracture: Secondary | ICD-10-CM | POA: Diagnosis present

## 2022-08-02 DIAGNOSIS — M6281 Muscle weakness (generalized): Secondary | ICD-10-CM | POA: Insufficient documentation

## 2022-08-02 DIAGNOSIS — R262 Difficulty in walking, not elsewhere classified: Secondary | ICD-10-CM | POA: Insufficient documentation

## 2022-08-02 DIAGNOSIS — R269 Unspecified abnormalities of gait and mobility: Secondary | ICD-10-CM

## 2022-08-02 DIAGNOSIS — R2689 Other abnormalities of gait and mobility: Secondary | ICD-10-CM | POA: Insufficient documentation

## 2022-08-02 DIAGNOSIS — R278 Other lack of coordination: Secondary | ICD-10-CM | POA: Diagnosis present

## 2022-08-02 DIAGNOSIS — S72001A Fracture of unspecified part of neck of right femur, initial encounter for closed fracture: Secondary | ICD-10-CM | POA: Diagnosis present

## 2022-08-02 DIAGNOSIS — S14129S Central cord syndrome at unspecified level of cervical spinal cord, sequela: Secondary | ICD-10-CM

## 2022-08-02 DIAGNOSIS — R2681 Unsteadiness on feet: Secondary | ICD-10-CM | POA: Diagnosis present

## 2022-08-02 NOTE — Therapy (Unsigned)
OUTPATIENT PHYSICAL THERAPY NEURO TREATMENT   Patient Name: Sherry [name] MRN: [id number] DOB:1934-04-01, 88 y.o., female Today's Date: 08/03/2022   PCP: Earnestine Mealing, MD REFERRING PROVIDER: Deeann Saint   PT End of Session - 08/02/22 1252     Visit Number 56    Number of Visits 74    Date for PT Re-Evaluation 09/27/22    Progress Note Due on Visit 60    PT Start Time 1148    PT Stop Time 1222    PT Time Calculation (min) 34 min    Equipment Utilized During Treatment Gait belt    Activity Tolerance Patient tolerated treatment well    Behavior During Therapy WFL for tasks assessed/performed                             Past Medical History:  Diagnosis Date   Acute blood loss anemia    Arthritis    Cancer (HCC)    skin   Central cord syndrome at C6 level of cervical spinal cord (HCC) 11/29/2017   Hypertension    Protein-calorie malnutrition, severe (HCC) 01/24/2018   S/P insertion of IVC (inferior vena caval) filter 01/24/2018   Tetraparesis (HCC)    Past Surgical History:  Procedure Laterality Date   ANTERIOR CERVICAL DECOMP/DISCECTOMY FUSION N/A 11/29/2017   Procedure: Cervical five-six, six-seven Anterior Cervical Decompression Fusion;  Surgeon: Jadene Pierini, MD;  Location: [facility] OR;  Service: Neurosurgery;  Laterality: N/A;  Cervical five-six, six-seven Anterior Cervical Decompression Fusion   CATARACT EXTRACTION     FEMUR IM NAIL Left 08/16/2021   Procedure: INTRAMEDULLARY (IM) NAIL FEMORAL;  Surgeon: Deeann Saint, MD;  Location: [facility] ORS;  Service: Orthopedics;  Laterality: Left;   IR IVC FILTER PLMT / S&I /IMG GUID/MOD SED  12/08/2017   Patient Active Problem List   Diagnosis Date Noted   Age-related osteoporosis without current pathological fracture 07/27/2022   Mild recurrent major depression (HCC) 07/27/2022   Hypertensive heart disease with other congestive heart failure (HCC) 02/14/2022   Chronic indwelling Foley catheter  02/14/2022   Closed hip fracture (HCC) 08/15/2021   DVT (deep venous thrombosis) (HCC) 08/15/2021   Quadriplegia (HCC) 08/15/2021   Fall 08/15/2021   Constipation due to slow transit 08/31/2018   Trauma 06/05/2018   Neuropathic pain 06/05/2018   Neurogenic bowel 06/05/2018   Vaginal yeast infection 01/30/2018   Healthcare-associated pneumonia 01/25/2018   Chronic allergic rhinitis 01/24/2018   Depression with anxiety 01/24/2018   UTI due to Klebsiella species 01/24/2018   Protein-calorie malnutrition, severe (HCC) 01/24/2018   S/P insertion of IVC (inferior vena caval) filter 01/24/2018   Tetraparesis (HCC) 01/20/2018   Neurogenic bladder 01/20/2018   Reactive depression    Benign essential HTN    Acute postoperative anemia due to expected blood loss    Central cord syndrome at C6 level of cervical spinal cord (HCC) 11/29/2017   Allergy to alpha-gal 11/25/2016   SCC (squamous cell carcinoma) 04/08/2014    ONSET DATE: 08/15/2021 (fall with B hip fx); Initial injury was in 2019- quadraplegia due to central cord syndrome of C6 secondary to neck fracture.   REFERRING DIAG: Bilateral Hip fx; Left Open- s/p Left IM nail femoral on 08/16/2021; Right closed  THERAPY DIAG:  Muscle weakness (generalized)  Other lack of coordination  Central cord syndrome, sequela (HCC)  Abnormality of gait and mobility  Rationale for Evaluation and Treatment Rehabilitation  SUBJECTIVE:   SUBJECTIVE STATEMENT:  Patient reports no updates and no changes since last visit.    Pt accompanied by:  Paid caregiver-   PERTINENT HISTORY: Patient was previous patient (see previous episode for specifics) She experienced a fall at home on 08/15/2021 and was in Hospital from 08/15/2021- 08/20/2021.  Most recent history per Dr. Shonna Chock on 08/20/2021.  Jerney A Pucci is a 88 y.o. female with medical history significant of quadriplegia due to central cord syndrome of C6 secondary to neck fracture,  hypertension, depression with anxiety, neurogenic bladder, bilateral DVT on Xarelto (s/p of IVC filter placement), who presents with fall and hip pain.   Patient has history of quadriplegia, and has been doing PT/OT with improvement of function. Pt has some use of both arms and can feed herself. She can walk a little bit with assistance. Pt accidentally fell at about 930 this morning after taking shower.  She injured her hips. Hospital Course:    Closed left hip fracture [facility]) s/p mechanical fall Closed right hip fracture (?old) Acute blood loss anemia --pt is s/p  INTRAMEDULLARY (IM) NAIL FEMORAL-left on 7/16  PAIN:  Are you having pain? NO: NPRS scale: 0/10 Pain location: Right hip Pain description: Ache Aggravating factors: any active Movement of right side Relieving factors: Rest  PRECAUTIONS: Fall  WEIGHT BEARING RESTRICTIONS Yes Bilateral non-weight bearing B LE's  FALLS: Has patient fallen in last 6 months? Yes. Number of falls 1  LIVING ENVIRONMENT: Lives with: lives with their family and lives in a nursing home Lives in: Other Long term care at [facility] Stairs: No Has following equipment at home: Wheelchair (power), Ramped entry, and hoyer lift  PLOF: Needs assistance with ADLs, Needs assistance with homemaking, Needs assistance with gait, Needs assistance with transfers, and Leisure: Work puzzles, read, Watch TV  PATIENT GOALS Patient goal is to walk; Be able to strengthen my arms for better use  OBJECTIVE:   DIAGNOSTIC FINDINGS: Overall cognitive statusEXAM: DG HIP (WITH OR WITHOUT PELVIS) 2-3V LEFT; DG HIP (WITH OR WITHOUT PELVIS) 2-3V RIGHT   COMPARISON:  CT of the abdomen and pelvis 01/21/2020   FINDINGS: Left proximal femur fracture noted just below the intertrochanteric region. Marked ventral angulation noted on the cross-table image. Slight varus angulation present   Comminuted intratrochanteric fracture present on the right also with marked ventral  angulation.   IMPRESSION: 1. Bilateral hip fractures. 2. Comminuted intertrochanteric fracture of the right hip. 3. Angulated fracture of the proximal left femur just below the intertrochanteric region.     Electronically Signed   By: Marin Roberts M.D.   On: 08/15/2021 11:54:   COGNITION:  Within functional limits for tasks assessed   SENSATION: Light touch: Impaired   EDEMA:  None  Strength R/L 2-/2- Shoulder flexion (anterior deltoid/pec major/coracobrachialis, axillary n. (C5-6) and musculocutaneous n. (C5-7)) 2-/3+ (within available ROM around 130 on lef)Shoulder abduction (deltoid/supraspinatus, axillary/suprascapular n, C5) 3+/3+ Shoulder external rotation (infraspinatus/teres minor) 4/4 Shoulder internal rotation (subcapularis/lats/pec major) 3+/3+ Shoulder extension (posterior deltoid, lats, teres major, axillary/thoracodorsal n.) 3+/3+ Shoulder horizontal abduction 4/4 Elbow flexion (biceps brachii, brachialis, brachioradialis, musculoskeletal n, C5-6) 4/4 Elbow extension (triceps, radial n, C7) 2-/2- Wrist Extension 3+/3+ Wrist Flexion   LOWER EXTREMITY MMT:    MMT Right 01/11/22 Left 01/11/22  Hip flexion 2- 2-  Hip extension 2- 2-  Hip abduction 2- 2-  Hip adduction 2- 3  Hip internal rotation 2- 2-  Hip external rotation 2- 2-  Knee flexion 2- 2-  Knee extension 2+  3+  Ankle dorsiflexion 2- NT - boot  Ankle plantarflexion 2- NT- boot  Ankle inversion 2- NT- boot  Ankle eversion 2- NT- boot  (Blank rows = not tested)  BED MOBILITY:  Unable to test due to NWB status of BLE- Patient currently dependent with all bed mobility using hoyer and caregiver assist  TRANSFERS: Currently BLE NWB- Dependent on use of hoyer for all transfers  FUNCTIONAL TESTs:   PATIENT SURVEYS:  FOTO 12  TODAY'S TREATMENT:    Therex:   AAROM Right hip flex- 15 reps each AROM B hip abd/abd - 15 reps each AROM Knee flex- 15 reps each LE AROM knee Ext-  15 reps each LE  THERAPEUTIC ACTIVITIES:   Stand pivot transfer from power w/c to  side of mat table- Max assist with use of gait belt and patient holding onto back of PT arms  Sit to stand with 1 person on each Side (hand under hand/forearm with use of gait belt)  x 4 trials- Max assist with left foot placement as patient with LLE extensor tone- Continued difficulty standing without extreme hyperextension and increased genu valgus of left knee.    Static standing x 4 trials-with above mentioned technique up to 1 min each. Patient described increased pressure on arms/shoulder and stated more difficult than standing in // bars. Increased difficulty to extend trunk overall today with max VC.    Patient required rest breaks between trials to recover.         Pt educated throughout session about proper posture and technique with exercises. Improved exercise technique, movement at target joints, use of target muscles after min to mod verbal, visual, tactile cues.   PATIENT EDUCATION: Education details: PT plan of care/use of resistive bands Person educated: Patient Education method: Explanation, Demonstration, Tactile cues, and Verbal cues Education comprehension: verbalized understanding, returned demonstration, verbal cues required, tactile cues required, and needs further education   HOME EXERCISE PROGRAM: No changes at this time  Access Code: [facility] URL: [email or web address] Date: 11/23/2021 Prepared by: Precious Bard Exercises - Seated Gluteal Sets - 2 x daily - 7 x weekly - 2 sets - 10 reps - 5 hold - Seated Quad Set - 2 x daily - 7 x weekly - 2 sets - 10 reps - 5 hold - Seated Long Arc Quad - 2 x daily - 7 x weekly - 2 sets - 10 reps - 5 hold - Seated March - 2 x daily - 7 x weekly - 2 sets - 10 reps - 5 hold - Seated Hip Abduction - 2 x daily - 7 x weekly - 2 sets - 10 reps - 5 hold - Seated Shoulder Shrugs - 2 x daily - 7 x weekly - 2 sets - 10 reps - 5 hold -  Wheelchair Pressure Relief - 2 x daily - 7 x weekly - 2 sets - 10 reps - 5 hold  GOALS: Goals reviewed with patient? Yes  SHORT TERM GOALS: Target date: 02/22/2022  Pt will be independent with initial UE strengthening HEP in order to improve strength and balance in order to decrease fall risk and improve function at home and work. Baseline: 10/19/2021- patient with no formal UE HEP; 12/02/2021= Patient verbalized knowledge of HEP including use of theraband for UE strengthening.  Goal status: GOAL MET  LONG TERM GOALS: Target date: 09/27/2022  Pt will be independent with final for UE/LE HEP in order to improve strength and balance in order to decrease fall risk  and improve function at home and work. Baseline: Patient is currently BLE NWB and unable to participate in HEP. Has order for UE strengthening. 01/11/2022- Patient now able to participate in LE strengthening although NWB still- good understanding for some basic exercises. Will keep goal active to incorporate progressive LE strengthening exercises. 04/07/2022= Patient still participating in progressive LE seated HEP and has no questions at this time.  Goal status: MET  2.  Pt will improve FOTO to target score of 35  to display perceived improvements in ability to complete ADL's.  Baseline: 10/19/2021= 12; 12/02/2021= 17; 01/11/2022=17; 04/07/2022= 17; 07/05/2022=17 Goal status: Ongoing  3.  Pt will increase strength of B UE  by at least 1/2 MMT grade in order to demonstrate improvement in strength and function  Baseline: patient has range of 2-/5 to 4/5 BUE Strength; 12/02/2021= 4/5 except with wrist ext. 01/11/2022= 4/5 B UE strength expept for wrist Goal status: Goal revised-no longer appropriate- working with OT on all UE strengthening.   4. Pt. Will increase strength of RLE by at least 1/2 MMT grade in order to demonstrate improvement in Standing/transfers.  Baseline: 2-/5 Right hip flex/knee ext/flex; 04/07/2022= 2- with right hip flex/knee ext  (lacking 28 deg from zero) 4/8= Patient able to ext right knee lacking 18 deg from zero. 07/05/2022=Left knee approx 8 deg from zero and right knee = 23 deg from zero  Goal status: Progressing  5. Pt. Will demo ability to stand pivot transfer with max assist for improved functional mobility and less dependent need on mechanical device.   Baseline: Dependent on hoyer lift for all transfers; 04/07/2022- Unable to test secondary to patient with recent UTI and right hand procedure - will attempt to reassess next visit. ; 04/12/22: Unable to successfully stand due to feet plates on loaned power w/c; 05/10/2022- Patient still having to use rental chair and has not received her original power w/c back to practice SPT. 07/04/2021= Patient just received new power w/c and attempted standing from 1st time today- Patient able to stand with max assist today from w/c and did not attempt to perform pivot transfer- difficulty with static standing and placing weight on right LE.   Goal status: ONGOING  6. Pt. Will demonstrate improved functional LE strength as seen by ability to stand > 2 min for improved transfer ability and pregait abilities.   Baseline: not assessed today due to recent dx: UTI; 04/07/2022- Unable to test secondary to patient with recent UTI and right hand procedure - will attempt to reassess next visit; 04/12/22: Unable to successfully stand due to feet plates on loaned power w/c4/09/2022- Patient still having to use rental chair and has not received her original power w/c back to practice Standing. 07/05/2022- Patient able to stand with max assist at trunk today for 2 min 3 sec. Will keep goal active to ensure consistency.   Goal status: ONGOING  ASSESSMENT:  CLINICAL IMPRESSION: Patient presented with increased difficulty with standing today- attempted different technique- standing up from edge of mat vs. In // bars and patient was able to perform yet increased pressure on arms and decreased overall time in  standing. She exhibited difficulty standing on LE with more genu valgus today and some report of discomfort yet she has not yet described. Will return to // bars next time.  Pt will continue to benefit from skilled PT services to optimize independence and reduced caregiver support.    OBJECTIVE IMPAIRMENTS Abnormal gait, decreased activity tolerance, decreased balance, decreased  coordination, decreased endurance, decreased mobility, difficulty walking, decreased ROM, decreased strength, hypomobility, impaired flexibility, impaired UE functional use, postural dysfunction, and pain.   ACTIVITY LIMITATIONS carrying, lifting, bending, sitting, standing, squatting, sleeping, stairs, transfers, bed mobility, continence, bathing, toileting, dressing, self feeding, reach over head, hygiene/grooming, and caring for others  PARTICIPATION LIMITATIONS: meal prep, cleaning, laundry, medication management, personal finances, interpersonal relationship, driving, shopping, community activity, and yard work  PERSONAL FACTORS Age, Time since onset of injury/illness/exacerbation, and 1-2 comorbidities: HTN, cervical Sx  are also affecting patient's functional outcome.   REHAB POTENTIAL: Good  CLINICAL DECISION MAKING: Evolving/moderate complexity  EVALUATION COMPLEXITY: Moderate  PLAN: PT FREQUENCY: 1-2x/week  PT DURATION: 12 weeks  PLANNED INTERVENTIONS: Therapeutic exercises, Therapeutic activity, Neuromuscular re-education, Balance training, Gait training, Patient/Family education, Self Care, Joint mobilization, DME instructions, Dry Needling, Electrical stimulation, Wheelchair mobility training, Spinal mobilization, Cryotherapy, Moist heat, and Manual therapy  PLAN FOR NEXT SESSION: Continue with weight bearing activities,  practice standing next visit back in // bars and LE strengthening as appropriate.   Louis Meckel, PT Physical Therapist- [facility]  08/03/2022,  7:56 AM

## 2022-08-02 NOTE — Therapy (Addendum)
Occupational Therapy Neuro Treatment Note     Patient Name: Sherry [name] MRN: [id number] DOB:05-Jan-1935, 88 y.o., female Today's Date: 08/02/2022                                         PCP: Dr. Tillman Abide REFERRING PROVIDER: Dr. Tillman Abide   OT End of Session - 08/02/22 1151     Visit Number [id number]    Number of Visits 96    Date for OT Re-Evaluation 09/27/22    Authorization Time Period Progress report period starting 12/14/2021    OT Start Time 1100    OT Stop Time 1145    OT Time Calculation (min) 45 min    Equipment Utilized During Treatment moist heat    Activity Tolerance Patient tolerated treatment well    Behavior During Therapy WFL for tasks assessed/performed                  Past Medical History:  Diagnosis Date   Acute blood loss anemia    Arthritis    Cancer (HCC)    skin   Central cord syndrome at C6 level of cervical spinal cord (HCC) 11/29/2017   Hypertension    Protein-calorie malnutrition, severe (HCC) 01/24/2018   S/P insertion of IVC (inferior vena caval) filter 01/24/2018   Tetraparesis (HCC)    Past Surgical History:  Procedure Laterality Date   ANTERIOR CERVICAL DECOMP/DISCECTOMY FUSION N/A 11/29/2017   Procedure: Cervical five-six, six-seven Anterior Cervical Decompression Fusion;  Surgeon: Jadene Pierini, MD;  Location: [facility] OR;  Service: Neurosurgery;  Laterality: N/A;  Cervical five-six, six-seven Anterior Cervical Decompression Fusion   CATARACT EXTRACTION     FEMUR IM NAIL Left 08/16/2021   Procedure: INTRAMEDULLARY (IM) NAIL FEMORAL;  Surgeon: Deeann Saint, MD;  Location: [facility] ORS;  Service: Orthopedics;  Laterality: Left;   IR IVC FILTER PLMT / S&I /IMG GUID/MOD SED  12/08/2017   Patient Active Problem List   Diagnosis Date Noted   Age-related osteoporosis without current pathological fracture 07/27/2022   Mild recurrent major depression (HCC) 07/27/2022   Hypertensive heart disease with other congestive heart failure  (HCC) 02/14/2022   Chronic indwelling Foley catheter 02/14/2022   Closed hip fracture (HCC) 08/15/2021   DVT (deep venous thrombosis) (HCC) 08/15/2021   Quadriplegia (HCC) 08/15/2021   Fall 08/15/2021   Constipation due to slow transit 08/31/2018   Trauma 06/05/2018   Neuropathic pain 06/05/2018   Neurogenic bowel 06/05/2018   Vaginal yeast infection 01/30/2018   Healthcare-associated pneumonia 01/25/2018   Chronic allergic rhinitis 01/24/2018   Depression with anxiety 01/24/2018   UTI due to Klebsiella species 01/24/2018   Protein-calorie malnutrition, severe (HCC) 01/24/2018   S/P insertion of IVC (inferior vena caval) filter 01/24/2018   Tetraparesis (HCC) 01/20/2018   Neurogenic bladder 01/20/2018   Reactive depression    Benign essential HTN    Acute postoperative anemia due to expected blood loss    Central cord syndrome at C6 level of cervical spinal cord (HCC) 11/29/2017   Allergy to alpha-gal 11/25/2016   SCC (squamous cell carcinoma) 04/08/2014    REFERRING DIAG: Central Cord Syndrome at C6 of the Cervical Spinal Cord, Fall with Bilateral Closed Hip Fractures, with ORIF repair with Intramedullary Nailing of the Left Hip Fracture.  THERAPY DIAG:  Muscle weakness (generalized)  Other lack of coordination  Rationale for Evaluation and Treatment Rehabilitation  PERTINENT HISTORY: Patient s/p fall November 29, 2017 resulting in diagnosis of central cord syndrome at C 6 level.  She has had therapy in multiple venues but with recent move to [facility], therapy staff recommended she seek outpatient therapy for her needs. Pt. Had a recent fall with Bilateral Closed Hip Fractures, with ORIF repair with Intramedullary Nailing of the Left Hip Fracture.   PRECAUTIONS: None  SUBJECTIVE:   Pt. reports doing okay today.  PAIN:  Are you having pain? No  OBJECTIVE:    Today's Treatment:   Manual Therapy:   Pt. tolerated soft tissue massage to the volar, and dorsal surface  of each digit on the bilateral hands, and thenar eminence secondary to stiffness following moist heat modality. Soft tissue mobilization was performed for Carpal, and metacarpal spread stretches. Manual therapy was performed independent of, and in preparation for therapeutic Ex.   Therapeutic Ex.    Pt. tolerated PROM followed by AROM bilateral wrist extension, PROM for bilateral digit MP, PIP, and DIP flexion, and extension, thumb radial, and palmar abduction. Pt. performed 3# dowel ex. For UE strengthening secondary to weakness. Bilateral shoulder flexion, chest press, circular patterns, and elbow flexion/extension were performed. Emphasis was placed on left wrist extension with moist heat modality. Pt. worked on the BUE for strengthening, and reciprocal motion using the UBE while seated for 8 min. with no resistance. Constant monitoring was provided.    PATIENT EDUCATION: RUE Distal ROM, and strength Person educated: Patient Education method: Explanation and Demonstration Education comprehension: verbalized understanding, returned demonstration, and needs further education   HOME EXERCISE PROGRAM Continue with ongoing HEPs  Measurements:  12/09/2021:  Shoulder flexion: R: 120(136), L: 138(150) Shoulder abduction: R: 108(120), L: 110(120) Elbow: R: 0-148, L: 0-146 Wrist flexion: R: 55(62), L: 78 Writ extension: R: 34(55), L: 3(4) RD: R: 12(20), L: 16(26) UD: R: 8(24), L: 6(18) Thumb radial abduction: R: 11(20), L: 8(20) Digit flexion to the [facility]: R:  2nd: 5cm(3.5cm), 3rd: 7.5 cm(5cm), 4th: 7cm(3cm), 5th: 6cm(5.5cm) L: 2nd: 8cm(8cm), 3rd: 8cm(8cm), 4th: 7.5cm(7cm), 5th: 7cm(7cm)  02/03/2022:   Shoulder flexion: R: 122(138), L: 148(154) Shoulder abduction: R: 109(125), L: 125(125 Elbow: R: 0-148, L: 0-146 Wrist flexion: R: 60(68), L: 79 Writ extension: R: 40(55), L: 3(10) RD: R: 14(24), L: 20(28) UD: R: 8(24), L: 6(18) Thumb radial abduction: R: 20(25), L: 8(20) Digit flexion  to the [facility]: R:  2nd: 4.5cm(3cm), 3rd: 6.5cm(4.5cm), 4th: 6.5 cm(3cm), 5th: 4.5cm(5cm) L: 2nd: 8cm(8cm), 3rd: 8cm(7cm), 4th: 7.5cm(7cm), 5th: 6.5cm(6.5cm)   04/07/2022:   Shoulder flexion: R: 125(150), L: 160(160) Shoulder abduction: R: 109(125), L: 125(125 Elbow: R: 0-148, L: 0-146 Wrist flexion: R: 60(68), L: 79 Writ extension: R: 40(55), L: 5(18) RD: R: 14(24), L: 20(28) UD: R: 18(24), L: 8(18) Thumb radial abduction: R: 20(25), L: 8(20) Digit flexion to the [facility]: R:  2nd: 4.5 cm(4 cm), 3rd: 6.5cm(3cm), 4th: 7 cm (5cm), 5th: 5cm(5cm) L: 2nd: 8cm(8cm), 3rd: 8cm(7cm), 4th: 7.5cm(7cm), 5th: 7cm(7cm)   07/05/2022:   Shoulder flexion: R: 125(154), L: 160(160) Shoulder abduction: R: 95(130), L: 143(143) Elbow: R: 0-148, L: 0-146 Wrist flexion: R: 60(68), L: 79 Writ extension: R: 40(60), L: 0(12) RD: R: 10(24), L: 12(20) UD: R: 16(24), L: 8(16) Thumb radial abduction: R: 20(25), L: 8(20) Digit flexion to the [facility]: R:  2nd: 4 cm(4cm), 3rd: 6.5cm( 3.5cm), 4th: 7 cm (5cm), 5th: 6cm(5cm) L: 2nd: 8cm(8cm), 3rd: 8cm(7cm), 4th: 8cm(7cm), 5th: 7cm(7cm)  OT Long Term Goals - 07/06/21 1106                                                                                          Target Date:  09/27/2022      OT LONG TERM GOAL #1   Title Patient and caregiver will demonstrate understanding of home exercise program for ROM.    Baseline 12/09/2021: Pt. Reports the restorative ROM program has not resumed at the facility. 10/28/2021: Pt. Is attempting exercises/stretches at home. The facility restorative aides have not resumed exercises. 09/14/2021:  Pt. Reports inconsistencies with restorative ROM at  her care home. Pt/caregiver education to be provided about ongoing ROM needs.    Time 12    Period Weeks    Status Deferred     OT LONG TERM GOAL #2   Title Pt.  will improve BUE strength to be able to sustain her UE's in elevation to efficiently, and effectively perform hair care, and  self-grooming tasks.   Baseline 02/03/2022: Pt. has improved, and is now able to sustain her Bilateral UEs in elevation long enough to efficiently, and effectively perform haircare, and self-grooming tasks. 12/09/2021: right shoulder 4-/5, left shoulder 4/5. Pt. has improved to be able to reach up for hair care, and self-grooming tasks. 10/28/2021: Right shoulder 3+/5, Left shoulder 4-/5 09/14/2021:  BUE shoulder strength: 3+/5 for shoulder flexion, and abduction.   Time 12    Period Weeks    Status Achieved               OT LONG TERM GOAL #3   Title Patient will demonstrate improved composite finger flexion to be able to firmly hold  and use adaptive devices during ADLs, and IADLs.   Baseline 07/05/2022: Pt. presents with digit MP, PIP, and DIP extensor tightness limiting her ability to securely grip objects in her bilateral hands.  04/07/2022: Pt. Has improved with right 2nd, and 3rd digit flexion towards the [facility]. Pt. Continues to have difficulty securely holding and applying deodorant.02/03/2022: Pt. Has improved with digit flexion, however continues to have difficulty securely holding and using deoderant. 12/09/2021: Bilateral hand/digit MP, PIP, and DIP extension tightness limits her ability achieve digit flexion to hold, and apply deodorant. 10/28/2021:  Pt. continues to have difficulty holding the deodorant. 01/12/2021: Pt. Presents with limited digit extension. Pt. Is able to initiate holding deodorant, however is unable to hold it while using it.   Time 12    Period Weeks    Status Revised   Target Date                  OT LONG TERM GOAL #4   Title Pt. Will improve bilateral wrist extension in preparation for anticipating, and initiating reaching for objects at the table.    Baseline 07/19/2022: Bilateral wrist extension in limited. 07/05/2022: Right: 40(60) Left: 0(12) 04/07/2022: Rght: 40(55) Left: 5(18) 02/03/2022: Right: 34(55) Left: 3(4) 12/09/2021: Right  34(55)  Left  3(4) 10/28/2021:  Right  22(38), Left 0(15) 09/14/2021:  Right: 17(35), left 2(15)   Time 12   Period weeks   Status Ongoing   Target  Date      OT LONG TERM GOAL #5   Title Pt. will write, and sign her name with 100% legibility, and modified independence.    Baseline 07/19/2022:  Pt. continues to be able to consistently fill out daily meal menus, and puzzles. 6/03/204: Pt. Is consistently filling out  daily meal menus, and puzzles. 04/07/2022: Pt. Reports that she continues to present with shaky appearing writing. Pt.reports that she is writing more when filling out her menu, and complete puzzles 02/03/2022: Pt. Continues to report legibility appears "shaky" when attempting signing her name. Pt.reports that she is writing more when filling out her menu, and complete puzzles.12/09/2021: Pt. Reports legibility appears "shaky" when attempting signing her name. Pt.reports that she is writing more when filling out her menu. 10/28/2021: pt. is able to sign her name, however legibility continues to be inconsistent. Pt. Reports "shaky appearance". 09/14/2021: Pt. continues to fill out her daily menu and complete puzzles. Pt. Continues to present with limited writing legibility.   Time 12    Period Weeks    Status  Ongoing         OT LONG TERM GOAL  #6   TITLE Pt. will turn pages in a book with modified independence.    Baseline 02/03/2022: Pt. Is able to effectively turn pages of a book. 10/28/2021: Pt. Is able to consistently turn book pages. 09/14/2021: Pt. has difficulty turning pages.    Time 12    Period Weeks    Status Achieved       OT LONG TERM GOAL  #7  TITLE Pt. Will increase  bilateral lateral pinch strength by 2 lbs to be able to securely grasp items during ADLs, and IADL tasks.   Baseline 07/19/2022: TBD 07/05/2022: TBD 07/2022: NT-Pinch meter out for calibration. 02/03/2022: Right: 6#, Left: 4# 12/09/2021:  Right: 6#, Left: 4#   Time 12  Period Weeks  Status Ongoing           OT LONG TERM GOAL  #8  TITLE Pt.  Will button a sweater with large 1" buttons, and zip her jacket with modified independence.   Baseline 06/07/2022: Pt. is able to use a button hook to button a sweater. Pt. Reports that she does not wear button down clothing often.  04/07/2022: Pt. Has difficulty manipulating buttons, however was introduced to a buttonhook. Pt. Was able to hold, and use a buttonhook for buttons on a shirt. 02/03/2022: Pt. is unable to manipulate buttons on her sweater, or zippers on her jacket.  Time 12  Period Weeks  Status Achieved   OT LONG TERM GOAL  #9  TITLE Pt. will complete plant care with modified independence.  Baseline 07/19/2022: Pt. Continues to be able to water, and care for some  of her plants. Pt. Has more difficulty with plants that are harder to reach. 07/05/2022: pt. Is able to water, and care for some  of her plants. Pt. Has more difficulty with plants that are harder to reach. 04/07/2022: Pt. is now able to hold a cup and water her plants.02/03/2022: Pt. has difficulty caring for her plants.   Time 12  Period Weeks  Status Ongoing   OT LONG TERM GOAL  #10  TITLE Pt. will demonstrate adaptive techniques to assist with the efficiency of self-dressing, or morning care tasks.  Baseline 07/19/2022: Pt. continues to require assist with self-dressing/morning care tasks.   07/05/2022: Continue with goal.  04/07/2022: Pt. Continues to require assist with the efficiency of self-dressing,  and morning care tasks.02/03/2022: Pt. requires assist from caregivers 2/2 time limitations during morning care.   Time 12  Period Weeks  Status Ongoing   Title Pt.  will improve BUE strength to improve ADL, and IADL functioning.  Baseline 07/19/2022:  BUE strength continues to be limited. 07/05/2022: shoulder flexion: right 4-/5, abduction: 3+/5, elbow flexion: right: 5/5, left 5/5, extension: right: 4-/5, left 4-/5, wrist extension: right: 3-/5, left: 2-/5  Time 12   Period Weeks   Status Ongoing     Plan - 07/22/21 1737      Clinical Impression Statement Pt. Continues to  tolerate Manual therapy, there. Ex., and ROM well today. Pt. Was able to tolerate 3# dowel ex. Pt. continues to present with tightness at the bilateral thumb webspace, and bilateral wrist flexor tightness, as well as digit MP, PIP, and DIP extensor tightness which continues to limit her ability to complete ADL tasks efficiently. Pt. continues to benefit from working on impoving ROM in order to work towards increasing bilateral hand grasp on objects, and increase engagement of bilateral hands during ADLs, and IADL tasks.               OT Occupational Profile and History Detailed Assessment- Review of Records and additional review of physical, cognitive, psychosocial history related to current functional performance    Occupational performance deficits (Please refer to evaluation for details): ADL's;IADL's;Leisure;Social Participation    Body Structure / Function / Physical Skills ADL;Continence;Dexterity;Flexibility;Strength;ROM;Balance;Coordination;FMC;IADL;Endurance;UE functional use;Decreased knowledge of use of DME;[facility]    Psychosocial Skills Coping Strategies    Rehab Potential Fair    Clinical Decision Making Several treatment options, min-mod task modification necessary    Comorbidities Affecting Occupational Performance: Presence of comorbidities impacting occupational performance    Comorbidities impacting occupational performance description: contractures of bilateral hands, dependent transfers,    Modification or Assistance to Complete Evaluation  No modification of tasks or assist necessary to complete eval    OT Frequency 2x / week    OT Duration 12 weeks    OT Treatment/Interventions Self-care/ADL training;Cryotherapy;Therapeutic exercise;DME and/or AE instruction;Balance training;Neuromuscular education;Manual Therapy;Splinting;Moist Heat;Passive range of motion;Therapeutic activities;Patient/family education    Plan continue to  progress ROM of digits, wrists and shoulders as it pertains to completion of ADL tasks that pt values.    Consulted and Agree with Plan of Care Patient            Olegario Messier, MS, OTR/L   08/02/22, 11:58 AM

## 2022-08-04 ENCOUNTER — Ambulatory Visit: Payer: Medicare HMO

## 2022-08-04 ENCOUNTER — Ambulatory Visit: Payer: Medicare HMO | Admitting: Occupational Therapy

## 2022-08-04 DIAGNOSIS — R278 Other lack of coordination: Secondary | ICD-10-CM

## 2022-08-04 DIAGNOSIS — S72002A Fracture of unspecified part of neck of left femur, initial encounter for closed fracture: Secondary | ICD-10-CM

## 2022-08-04 DIAGNOSIS — M6281 Muscle weakness (generalized): Secondary | ICD-10-CM

## 2022-08-04 DIAGNOSIS — R262 Difficulty in walking, not elsewhere classified: Secondary | ICD-10-CM

## 2022-08-04 DIAGNOSIS — R2689 Other abnormalities of gait and mobility: Secondary | ICD-10-CM

## 2022-08-04 DIAGNOSIS — S14129S Central cord syndrome at unspecified level of cervical spinal cord, sequela: Secondary | ICD-10-CM

## 2022-08-04 DIAGNOSIS — R269 Unspecified abnormalities of gait and mobility: Secondary | ICD-10-CM

## 2022-08-04 NOTE — Therapy (Signed)
OUTPATIENT PHYSICAL THERAPY NEURO TREATMENT   Patient Name: Sherry [name] MRN: [id number] DOB:[birth year], 88 y.o., female Today's Date: 08/03/2022   PCP: Earnestine Mealing, MD REFERRING PROVIDER: Deeann Saint   PT End of Session - 08/02/22 1252     Visit Number 56    Number of Visits 74    Date for PT Re-Evaluation 09/27/22    Progress Note Due on Visit 60    PT Start Time 1148    PT Stop Time 1222    PT Time Calculation (min) 34 min    Equipment Utilized During Treatment Gait belt    Activity Tolerance Patient tolerated treatment well    Behavior During Therapy WFL for tasks assessed/performed                             Past Medical History:  Diagnosis Date   Acute blood loss anemia    Arthritis    Cancer (HCC)    skin   Central cord syndrome at C6 level of cervical spinal cord (HCC) 11/29/2017   Hypertension    Protein-calorie malnutrition, severe (HCC) 01/24/2018   S/P insertion of IVC (inferior vena caval) filter 01/24/2018   Tetraparesis (HCC)    Past Surgical History:  Procedure Laterality Date   ANTERIOR CERVICAL DECOMP/DISCECTOMY FUSION N/A 11/29/2017   Procedure: Cervical five-six, six-seven Anterior Cervical Decompression Fusion;  Surgeon: Jadene Pierini, MD;  Location: [facility] OR;  Service: Neurosurgery;  Laterality: N/A;  Cervical five-six, six-seven Anterior Cervical Decompression Fusion   CATARACT EXTRACTION     FEMUR IM NAIL Left 08/16/2021   Procedure: INTRAMEDULLARY (IM) NAIL FEMORAL;  Surgeon: Deeann Saint, MD;  Location: [facility] ORS;  Service: Orthopedics;  Laterality: Left;   IR IVC FILTER PLMT / S&I /IMG GUID/MOD SED  12/08/2017   Patient Active Problem List   Diagnosis Date Noted   Age-related osteoporosis without current pathological fracture 07/27/2022   Mild recurrent major depression (HCC) 07/27/2022   Hypertensive heart disease with other congestive heart failure (HCC) 02/14/2022   Chronic indwelling Foley catheter  02/14/2022   Closed hip fracture (HCC) 08/15/2021   DVT (deep venous thrombosis) (HCC) 08/15/2021   Quadriplegia (HCC) 08/15/2021   Fall 08/15/2021   Constipation due to slow transit 08/31/2018   Trauma 06/05/2018   Neuropathic pain 06/05/2018   Neurogenic bowel 06/05/2018   Vaginal yeast infection 01/30/2018   Healthcare-associated pneumonia 01/25/2018   Chronic allergic rhinitis 01/24/2018   Depression with anxiety 01/24/2018   UTI due to Klebsiella species 01/24/2018   Protein-calorie malnutrition, severe (HCC) 01/24/2018   S/P insertion of IVC (inferior vena caval) filter 01/24/2018   Tetraparesis (HCC) 01/20/2018   Neurogenic bladder 01/20/2018   Reactive depression    Benign essential HTN    Acute postoperative anemia due to expected blood loss    Central cord syndrome at C6 level of cervical spinal cord (HCC) 11/29/2017   Allergy to alpha-gal 11/25/2016   SCC (squamous cell carcinoma) 04/08/2014    ONSET DATE: 08/15/2021 (fall with B hip fx); Initial injury was in 2019- quadraplegia due to central cord syndrome of C6 secondary to neck fracture.   REFERRING DIAG: Bilateral Hip fx; Left Open- s/p Left IM nail femoral on 08/16/2021; Right closed  THERAPY DIAG:  Muscle weakness (generalized)  Other lack of coordination  Central cord syndrome, sequela (HCC)  Abnormality of gait and mobility  Rationale for Evaluation and Treatment Rehabilitation  SUBJECTIVE:   SUBJECTIVE STATEMENT:  Patient reports feeling pretty good today and ready to try to stand and do her best.     Pt accompanied by:  Paid caregiver-   PERTINENT HISTORY: Patient was previous patient (see previous episode for specifics) She experienced a fall at home on 08/15/2021 and was in Hospital from 08/15/2021- 08/20/2021.  Most recent history per Dr. Shonna Chock on 08/20/2021.  Sherry [name] is a 88 y.o. female with medical history significant of quadriplegia due to central cord syndrome of C6 secondary  to neck fracture, hypertension, depression with anxiety, neurogenic bladder, bilateral DVT on Xarelto (s/p of IVC filter placement), who presents with fall and hip pain.   Patient has history of quadriplegia, and has been doing PT/OT with improvement of function. Pt has some use of both arms and can feed herself. She can walk a little bit with assistance. Pt accidentally fell at about 930 this morning after taking shower.  She injured her hips. Hospital Course:    Closed left hip fracture [facility]) s/p mechanical fall Closed right hip fracture (?old) Acute blood loss anemia --pt is s/p  INTRAMEDULLARY (IM) NAIL FEMORAL-left on 7/16  PAIN:  Are you having pain? NO: NPRS scale: 0/10 Pain location: Right hip Pain description: Ache Aggravating factors: any active Movement of right side Relieving factors: Rest  PRECAUTIONS: Fall  WEIGHT BEARING RESTRICTIONS Yes Bilateral non-weight bearing B LE's  FALLS: Has patient fallen in last 6 months? Yes. Number of falls 1  LIVING ENVIRONMENT: Lives with: lives with their family and lives in a nursing home Lives in: Other Long term care at [facility] Stairs: No Has following equipment at home: Wheelchair (power), Ramped entry, and hoyer lift  PLOF: Needs assistance with ADLs, Needs assistance with homemaking, Needs assistance with gait, Needs assistance with transfers, and Leisure: Work puzzles, read, Watch TV  PATIENT GOALS Patient goal is to walk; Be able to strengthen my arms for better use  OBJECTIVE:   DIAGNOSTIC FINDINGS: Overall cognitive statusEXAM: DG HIP (WITH OR WITHOUT PELVIS) 2-3V LEFT; DG HIP (WITH OR WITHOUT PELVIS) 2-3V RIGHT   COMPARISON:  CT of the abdomen and pelvis 01/21/2020   FINDINGS: Left proximal femur fracture noted just below the intertrochanteric region. Marked ventral angulation noted on the cross-table image. Slight varus angulation present   Comminuted intratrochanteric fracture present on the right also  with marked ventral angulation.   IMPRESSION: 1. Bilateral hip fractures. 2. Comminuted intertrochanteric fracture of the right hip. 3. Angulated fracture of the proximal left femur just below the intertrochanteric region.     Electronically Signed   By: Marin Roberts M.D.   On: 08/15/2021 11:54:   COGNITION:  Within functional limits for tasks assessed   SENSATION: Light touch: Impaired   EDEMA:  None  Strength R/L 2-/2- Shoulder flexion (anterior deltoid/pec major/coracobrachialis, axillary n. (C5-6) and musculocutaneous n. (C5-7)) 2-/3+ (within available ROM around 130 on lef)Shoulder abduction (deltoid/supraspinatus, axillary/suprascapular n, C5) 3+/3+ Shoulder external rotation (infraspinatus/teres minor) 4/4 Shoulder internal rotation (subcapularis/lats/pec major) 3+/3+ Shoulder extension (posterior deltoid, lats, teres major, axillary/thoracodorsal n.) 3+/3+ Shoulder horizontal abduction 4/4 Elbow flexion (biceps brachii, brachialis, brachioradialis, musculoskeletal n, C5-6) 4/4 Elbow extension (triceps, radial n, C7) 2-/2- Wrist Extension 3+/3+ Wrist Flexion   LOWER EXTREMITY MMT:    MMT Right 01/11/22 Left 01/11/22  Hip flexion 2- 2-  Hip extension 2- 2-  Hip abduction 2- 2-  Hip adduction 2- 3  Hip internal rotation 2- 2-  Hip external rotation 2- 2-  Knee  flexion 2- 2-  Knee extension 2+ 3+  Ankle dorsiflexion 2- NT - boot  Ankle plantarflexion 2- NT- boot  Ankle inversion 2- NT- boot  Ankle eversion 2- NT- boot  (Blank rows = not tested)  BED MOBILITY:  Unable to test due to NWB status of BLE- Patient currently dependent with all bed mobility using hoyer and caregiver assist  TRANSFERS: Currently BLE NWB- Dependent on use of hoyer for all transfers  FUNCTIONAL TESTs:   PATIENT SURVEYS:  FOTO 12  TODAY'S TREATMENT:     THERAPEUTIC ACTIVITIES:   Sit to stand x 5 trials- Physical assist with bilateral foot placement as patient  with tendency to kick left LE out- Max assist to stand using gait belt    Static standing x 5 trials-  1) around 25 sec - difficulty getting weight placed anteriorly with poor trunk extension.  2) around 50 sec  3) around 30 sec 4) 2 min 25 sec (best stand with left LE positioned well and weight shifted better in more erect alignment.  5 ) 1 min 22 sec.   Patient required rest breaks between trials to recover.         Pt educated throughout session about proper posture and technique with exercises. Improved exercise technique, movement at target joints, use of target muscles after min to mod verbal, visual, tactile cues.   PATIENT EDUCATION: Education details: PT plan of care/use of resistive bands Person educated: Patient Education method: Explanation, Demonstration, Tactile cues, and Verbal cues Education comprehension: verbalized understanding, returned demonstration, verbal cues required, tactile cues required, and needs further education   HOME EXERCISE PROGRAM: No changes at this time  Access Code: [facility] URL: [email or web address] Date: 11/23/2021 Prepared by: Precious Bard Exercises - Seated Gluteal Sets - 2 x daily - 7 x weekly - 2 sets - 10 reps - 5 hold - Seated Quad Set - 2 x daily - 7 x weekly - 2 sets - 10 reps - 5 hold - Seated Long Arc Quad - 2 x daily - 7 x weekly - 2 sets - 10 reps - 5 hold - Seated March - 2 x daily - 7 x weekly - 2 sets - 10 reps - 5 hold - Seated Hip Abduction - 2 x daily - 7 x weekly - 2 sets - 10 reps - 5 hold - Seated Shoulder Shrugs - 2 x daily - 7 x weekly - 2 sets - 10 reps - 5 hold - Wheelchair Pressure Relief - 2 x daily - 7 x weekly - 2 sets - 10 reps - 5 hold  GOALS: Goals reviewed with patient? Yes  SHORT TERM GOALS: Target date: 02/22/2022  Pt will be independent with initial UE strengthening HEP in order to improve strength and balance in order to decrease fall risk and improve function at home and work. Baseline:  10/19/2021- patient with no formal UE HEP; 12/02/2021= Patient verbalized knowledge of HEP including use of theraband for UE strengthening.  Goal status: GOAL MET  LONG TERM GOALS: Target date: 09/27/2022  Pt will be independent with final for UE/LE HEP in order to improve strength and balance in order to decrease fall risk and improve function at home and work. Baseline: Patient is currently BLE NWB and unable to participate in HEP. Has order for UE strengthening. 01/11/2022- Patient now able to participate in LE strengthening although NWB still- good understanding for some basic exercises. Will keep goal active to incorporate progressive LE strengthening exercises. 04/07/2022=  Patient still participating in progressive LE seated HEP and has no questions at this time.  Goal status: MET  2.  Pt will improve FOTO to target score of 35  to display perceived improvements in ability to complete ADL's.  Baseline: 10/19/2021= 12; 12/02/2021= 17; 01/11/2022=17; 04/07/2022= 17; 07/05/2022=17 Goal status: Ongoing  3.  Pt will increase strength of B UE  by at least 1/2 MMT grade in order to demonstrate improvement in strength and function  Baseline: patient has range of 2-/5 to 4/5 BUE Strength; 12/02/2021= 4/5 except with wrist ext. 01/11/2022= 4/5 B UE strength expept for wrist Goal status: Goal revised-no longer appropriate- working with OT on all UE strengthening.   4. Pt. Will increase strength of RLE by at least 1/2 MMT grade in order to demonstrate improvement in Standing/transfers.  Baseline: 2-/5 Right hip flex/knee ext/flex; 04/07/2022= 2- with right hip flex/knee ext (lacking 28 deg from zero) 4/8= Patient able to ext right knee lacking 18 deg from zero. 07/05/2022=Left knee approx 8 deg from zero and right knee = 23 deg from zero  Goal status: Progressing  5. Pt. Will demo ability to stand pivot transfer with max assist for improved functional mobility and less dependent need on mechanical device.    Baseline: Dependent on hoyer lift for all transfers; 04/07/2022- Unable to test secondary to patient with recent UTI and right hand procedure - will attempt to reassess next visit. ; 04/12/22: Unable to successfully stand due to feet plates on loaned power w/c; 05/10/2022- Patient still having to use rental chair and has not received her original power w/c back to practice SPT. 07/04/2021= Patient just received new power w/c and attempted standing from 1st time today- Patient able to stand with max assist today from w/c and did not attempt to perform pivot transfer- difficulty with static standing and placing weight on right LE.   Goal status: ONGOING  6. Pt. Will demonstrate improved functional LE strength as seen by ability to stand > 2 min for improved transfer ability and pregait abilities.   Baseline: not assessed today due to recent dx: UTI; 04/07/2022- Unable to test secondary to patient with recent UTI and right hand procedure - will attempt to reassess next visit; 04/12/22: Unable to successfully stand due to feet plates on loaned power w/c4/09/2022- Patient still having to use rental chair and has not received her original power w/c back to practice Standing. 07/05/2022- Patient able to stand with max assist at trunk today for 2 min 3 sec. Will keep goal active to ensure consistency.   Goal status: ONGOING  ASSESSMENT:  CLINICAL IMPRESSION: Patient presented with improved standing overall today with use of // bars versus HHA. She has a couple of really well positioned stand in which she was able to weight shift forward and activate some lumbar ext and glutes well and less overall weight through her UE on those trials.  Pt will continue to benefit from skilled PT services to optimize independence and reduced caregiver support.    OBJECTIVE IMPAIRMENTS Abnormal gait, decreased activity tolerance, decreased balance, decreased coordination, decreased endurance, decreased mobility, difficulty walking,  decreased ROM, decreased strength, hypomobility, impaired flexibility, impaired UE functional use, postural dysfunction, and pain.   ACTIVITY LIMITATIONS carrying, lifting, bending, sitting, standing, squatting, sleeping, stairs, transfers, bed mobility, continence, bathing, toileting, dressing, self feeding, reach over head, hygiene/grooming, and caring for others  PARTICIPATION LIMITATIONS: meal prep, cleaning, laundry, medication management, personal finances, interpersonal relationship, driving, shopping, community activity, and yard  work  PERSONAL FACTORS Age, Time since onset of injury/illness/exacerbation, and 1-2 comorbidities: HTN, cervical Sx  are also affecting patient's functional outcome.   REHAB POTENTIAL: Good  CLINICAL DECISION MAKING: Evolving/moderate complexity  EVALUATION COMPLEXITY: Moderate  PLAN: PT FREQUENCY: 1-2x/week  PT DURATION: 12 weeks  PLANNED INTERVENTIONS: Therapeutic exercises, Therapeutic activity, Neuromuscular re-education, Balance training, Gait training, Patient/Family education, Self Care, Joint mobilization, DME instructions, Dry Needling, Electrical stimulation, Wheelchair mobility training, Spinal mobilization, Cryotherapy, Moist heat, and Manual therapy  PLAN FOR NEXT SESSION: Continue with weight bearing activities,  practice standing next visit back in // bars and LE strengthening as appropriate.   Louis Meckel, PT Physical Therapist- [facility]  08/03/2022, 7:56 AM

## 2022-08-04 NOTE — Therapy (Signed)
Occupational Therapy Neuro Treatment Note     Patient Name: Sherry [name] MRN: [id number] DOB:29-Jul-1934, 88 y.o., female Today's Date: 08/04/2022                                         PCP: Dr. Tillman Abide REFERRING PROVIDER: Dr. Tillman Abide   OT End of Session - 08/04/22 1759     Visit Number [id number]    Number of Visits 96    Date for OT Re-Evaluation 09/27/22    Authorization Time Period Progress report period starting 12/14/2021    OT Start Time 1100    OT Stop Time 1145    OT Time Calculation (min) 45 min    Activity Tolerance Patient tolerated treatment well    Behavior During Therapy [facility] for tasks assessed/performed                  Past Medical History:  Diagnosis Date   Acute blood loss anemia    Arthritis    Cancer (HCC)    skin   Central cord syndrome at C6 level of cervical spinal cord (HCC) 11/29/2017   Hypertension    Protein-calorie malnutrition, severe (HCC) 01/24/2018   S/P insertion of IVC (inferior vena caval) filter 01/24/2018   Tetraparesis (HCC)    Past Surgical History:  Procedure Laterality Date   ANTERIOR CERVICAL DECOMP/DISCECTOMY FUSION N/A 11/29/2017   Procedure: Cervical five-six, six-seven Anterior Cervical Decompression Fusion;  Surgeon: Jadene Pierini, MD;  Location: [facility] OR;  Service: Neurosurgery;  Laterality: N/A;  Cervical five-six, six-seven Anterior Cervical Decompression Fusion   CATARACT EXTRACTION     FEMUR IM NAIL Left 08/16/2021   Procedure: INTRAMEDULLARY (IM) NAIL FEMORAL;  Surgeon: Deeann Saint, MD;  Location: [facility] ORS;  Service: Orthopedics;  Laterality: Left;   IR IVC FILTER PLMT / S&I /IMG GUID/MOD SED  12/08/2017   Patient Active Problem List   Diagnosis Date Noted   Age-related osteoporosis without current pathological fracture 07/27/2022   Mild recurrent major depression (HCC) 07/27/2022   Hypertensive heart disease with other congestive heart failure (HCC) 02/14/2022   Chronic indwelling Foley catheter  02/14/2022   Closed hip fracture (HCC) 08/15/2021   DVT (deep venous thrombosis) (HCC) 08/15/2021   Quadriplegia (HCC) 08/15/2021   Fall 08/15/2021   Constipation due to slow transit 08/31/2018   Trauma 06/05/2018   Neuropathic pain 06/05/2018   Neurogenic bowel 06/05/2018   Vaginal yeast infection 01/30/2018   Healthcare-associated pneumonia 01/25/2018   Chronic allergic rhinitis 01/24/2018   Depression with anxiety 01/24/2018   UTI due to Klebsiella species 01/24/2018   Protein-calorie malnutrition, severe (HCC) 01/24/2018   S/P insertion of IVC (inferior vena caval) filter 01/24/2018   Tetraparesis (HCC) 01/20/2018   Neurogenic bladder 01/20/2018   Reactive depression    Benign essential HTN    Acute postoperative anemia due to expected blood loss    Central cord syndrome at C6 level of cervical spinal cord (HCC) 11/29/2017   Allergy to alpha-gal 11/25/2016   SCC (squamous cell carcinoma) 04/08/2014    REFERRING DIAG: Central Cord Syndrome at C6 of the Cervical Spinal Cord, Fall with Bilateral Closed Hip Fractures, with ORIF repair with Intramedullary Nailing of the Left Hip Fracture.  THERAPY DIAG:  Muscle weakness (generalized)  Other lack of coordination  Rationale for Evaluation and Treatment Rehabilitation  PERTINENT HISTORY: Patient s/p fall November 29, 2017  resulting in diagnosis of central cord syndrome at C 6 level.  She has had therapy in multiple venues but with recent move to [facility], therapy staff recommended she seek outpatient therapy for her needs. Pt. Had a recent fall with Bilateral Closed Hip Fractures, with ORIF repair with Intramedullary Nailing of the Left Hip Fracture.   PRECAUTIONS: None  SUBJECTIVE:   Pt. reports doing okay today.  PAIN:  Are you having pain? No  OBJECTIVE:    Today's Treatment:   Manual Therapy:   Pt. tolerated soft tissue massage to the volar, and dorsal surface of each digit on the bilateral hands, and thenar  eminence secondary to stiffness following moist heat modality. Soft tissue mobilization was performed for Carpal, and metacarpal spread stretches. Manual therapy was performed independent of, and in preparation for therapeutic Ex.   Therapeutic Ex.    Pt. tolerated PROM followed by AROM bilateral wrist extension, PROM for bilateral digit MP, PIP, and DIP flexion, and extension, thumb radial, and palmar abduction. Pt. performed 3# dowel ex. For UE strengthening secondary to weakness. Bilateral shoulder flexion, chest press, circular patterns, and elbow flexion/extension were performed. Emphasis was placed on left wrist extension with moist heat modality. Pt. worked on the BUE for strengthening, and reciprocal motion using the UBE while seated for 8 min. with no resistance. Constant monitoring was provided.    PATIENT EDUCATION: RUE Distal ROM, and strength Person educated: Patient Education method: Explanation and Demonstration Education comprehension: verbalized understanding, returned demonstration, and needs further education   HOME EXERCISE PROGRAM Continue with ongoing HEPs  Measurements:  12/09/2021:  Shoulder flexion: R: 120(136), L: 138(150) Shoulder abduction: R: 108(120), L: 110(120) Elbow: R: 0-148, L: 0-146 Wrist flexion: R: 55(62), L: 78 Writ extension: R: 34(55), L: 3(4) RD: R: 12(20), L: 16(26) UD: R: 8(24), L: 6(18) Thumb radial abduction: R: 11(20), L: 8(20) Digit flexion to the [facility]: R:  2nd: 5cm(3.5cm), 3rd: 7.5 cm(5cm), 4th: 7cm(3cm), 5th: 6cm(5.5cm) L: 2nd: 8cm(8cm), 3rd: 8cm(8cm), 4th: 7.5cm(7cm), 5th: 7cm(7cm)  02/03/2022:   Shoulder flexion: R: 122(138), L: 148(154) Shoulder abduction: R: 109(125), L: 125(125 Elbow: R: 0-148, L: 0-146 Wrist flexion: R: 60(68), L: 79 Writ extension: R: 40(55), L: 3(10) RD: R: 14(24), L: 20(28) UD: R: 8(24), L: 6(18) Thumb radial abduction: R: 20(25), L: 8(20) Digit flexion to the [facility]: R:  2nd: 4.5cm(3cm), 3rd:  6.5cm(4.5cm), 4th: 6.5 cm(3cm), 5th: 4.5cm(5cm) L: 2nd: 8cm(8cm), 3rd: 8cm(7cm), 4th: 7.5cm(7cm), 5th: 6.5cm(6.5cm)   04/07/2022:   Shoulder flexion: R: 125(150), L: 160(160) Shoulder abduction: R: 109(125), L: 125(125 Elbow: R: 0-148, L: 0-146 Wrist flexion: R: 60(68), L: 79 Writ extension: R: 40(55), L: 5(18) RD: R: 14(24), L: 20(28) UD: R: 18(24), L: 8(18) Thumb radial abduction: R: 20(25), L: 8(20) Digit flexion to the [facility]: R:  2nd: 4.5 cm(4 cm), 3rd: 6.5cm(3cm), 4th: 7 cm (5cm), 5th: 5cm(5cm) L: 2nd: 8cm(8cm), 3rd: 8cm(7cm), 4th: 7.5cm(7cm), 5th: 7cm(7cm)   07/05/2022:   Shoulder flexion: R: 125(154), L: 160(160) Shoulder abduction: R: 95(130), L: 143(143) Elbow: R: 0-148, L: 0-146 Wrist flexion: R: 60(68), L: 79 Writ extension: R: 40(60), L: 0(12) RD: R: 10(24), L: 12(20) UD: R: 16(24), L: 8(16) Thumb radial abduction: R: 20(25), L: 8(20) Digit flexion to the [facility]: R:  2nd: 4 cm(4cm), 3rd: 6.5cm( 3.5cm), 4th: 7 cm (5cm), 5th: 6cm(5cm) L: 2nd: 8cm(8cm), 3rd: 8cm(7cm), 4th: 8cm(7cm), 5th: 7cm(7cm)            OT Long Term Goals - 07/06/21 1106  Target Date:  09/27/2022      OT LONG TERM GOAL #1   Title Patient and caregiver will demonstrate understanding of home exercise program for ROM.    Baseline 12/09/2021: Pt. Reports the restorative ROM program has not resumed at the facility. 10/28/2021: Pt. Is attempting exercises/stretches at home. The facility restorative aides have not resumed exercises. 09/14/2021:  Pt. Reports inconsistencies with restorative ROM at  her care home. Pt/caregiver education to be provided about ongoing ROM needs.    Time 12    Period Weeks    Status Deferred     OT LONG TERM GOAL #2   Title Pt.  will improve BUE strength to be able to sustain her UE's in elevation to efficiently, and effectively perform hair care, and self-grooming tasks.   Baseline  02/03/2022: Pt. has improved, and is now able to sustain her Bilateral UEs in elevation long enough to efficiently, and effectively perform haircare, and self-grooming tasks. 12/09/2021: right shoulder 4-/5, left shoulder 4/5. Pt. has improved to be able to reach up for hair care, and self-grooming tasks. 10/28/2021: Right shoulder 3+/5, Left shoulder 4-/5 09/14/2021:  BUE shoulder strength: 3+/5 for shoulder flexion, and abduction.   Time 12    Period Weeks    Status Achieved               OT LONG TERM GOAL #3   Title Patient will demonstrate improved composite finger flexion to be able to firmly hold  and use adaptive devices during ADLs, and IADLs.   Baseline 07/05/2022: Pt. presents with digit MP, PIP, and DIP extensor tightness limiting her ability to securely grip objects in her bilateral hands.  04/07/2022: Pt. Has improved with right 2nd, and 3rd digit flexion towards the [facility]. Pt. Continues to have difficulty securely holding and applying deodorant.02/03/2022: Pt. Has improved with digit flexion, however continues to have difficulty securely holding and using deoderant. 12/09/2021: Bilateral hand/digit MP, PIP, and DIP extension tightness limits her ability achieve digit flexion to hold, and apply deodorant. 10/28/2021:  Pt. continues to have difficulty holding the deodorant. 01/12/2021: Pt. Presents with limited digit extension. Pt. Is able to initiate holding deodorant, however is unable to hold it while using it.   Time 12    Period Weeks    Status Revised   Target Date                  OT LONG TERM GOAL #4   Title Pt. Will improve bilateral wrist extension in preparation for anticipating, and initiating reaching for objects at the table.    Baseline 07/19/2022: Bilateral wrist extension in limited. 07/05/2022: Right: 40(60) Left: 0(12) 04/07/2022: Rght: 40(55) Left: 5(18) 02/03/2022: Right: 34(55) Left: 3(4) 12/09/2021: Right  34(55)  Left  3(4) 10/28/2021:  Right 22(38), Left 0(15) 09/14/2021:   Right: 17(35), left 2(15)   Time 12   Period weeks   Status Ongoing   Target Date      OT LONG TERM GOAL #5   Title Pt. will write, and sign her name with 100% legibility, and modified independence.    Baseline 07/19/2022:  Pt. continues to be able to consistently fill out daily meal menus, and puzzles. 6/03/204: Pt. Is consistently filling out  daily meal menus, and puzzles. 04/07/2022: Pt. Reports that she continues to present with shaky appearing writing. Pt.reports that she is writing more when filling out her menu, and complete puzzles 02/03/2022: Pt. Continues to report legibility appears "shaky" when attempting signing  her name. Pt.reports that she is writing more when filling out her menu, and complete puzzles.12/09/2021: Pt. Reports legibility appears "shaky" when attempting signing her name. Pt.reports that she is writing more when filling out her menu. 10/28/2021: pt. is able to sign her name, however legibility continues to be inconsistent. Pt. Reports "shaky appearance". 09/14/2021: Pt. continues to fill out her daily menu and complete puzzles. Pt. Continues to present with limited writing legibility.   Time 12    Period Weeks    Status  Ongoing         OT LONG TERM GOAL  #6   TITLE Pt. will turn pages in a book with modified independence.    Baseline 02/03/2022: Pt. Is able to effectively turn pages of a book. 10/28/2021: Pt. Is able to consistently turn book pages. 09/14/2021: Pt. has difficulty turning pages.    Time 12    Period Weeks    Status Achieved       OT LONG TERM GOAL  #7  TITLE Pt. Will increase  bilateral lateral pinch strength by 2 lbs to be able to securely grasp items during ADLs, and IADL tasks.   Baseline 07/19/2022: TBD 07/05/2022: TBD 07/2022: NT-Pinch meter out for calibration. 02/03/2022: Right: 6#, Left: 4# 12/09/2021:  Right: 6#, Left: 4#   Time 12  Period Weeks  Status Ongoing           OT LONG TERM GOAL  #8  TITLE Pt. Will button a sweater with large  1" buttons, and zip her jacket with modified independence.   Baseline 06/07/2022: Pt. is able to use a button hook to button a sweater. Pt. Reports that she does not wear button down clothing often.  04/07/2022: Pt. Has difficulty manipulating buttons, however was introduced to a buttonhook. Pt. Was able to hold, and use a buttonhook for buttons on a shirt. 02/03/2022: Pt. is unable to manipulate buttons on her sweater, or zippers on her jacket.  Time 12  Period Weeks  Status Achieved   OT LONG TERM GOAL  #9  TITLE Pt. will complete plant care with modified independence.  Baseline 07/19/2022: Pt. Continues to be able to water, and care for some  of her plants. Pt. Has more difficulty with plants that are harder to reach. 07/05/2022: pt. Is able to water, and care for some  of her plants. Pt. Has more difficulty with plants that are harder to reach. 04/07/2022: Pt. is now able to hold a cup and water her plants.02/03/2022: Pt. has difficulty caring for her plants.   Time 12  Period Weeks  Status Ongoing   OT LONG TERM GOAL  #10  TITLE Pt. will demonstrate adaptive techniques to assist with the efficiency of self-dressing, or morning care tasks.  Baseline 07/19/2022: Pt. continues to require assist with self-dressing/morning care tasks.   07/05/2022: Continue with goal.  04/07/2022: Pt. Continues to require assist with the efficiency of self-dressing, and morning care tasks.02/03/2022: Pt. requires assist from caregivers 2/2 time limitations during morning care.   Time 12  Period Weeks  Status Ongoing   Title Pt.  will improve BUE strength to improve ADL, and IADL functioning.  Baseline 07/19/2022:  BUE strength continues to be limited. 07/05/2022: shoulder flexion: right 4-/5, abduction: 3+/5, elbow flexion: right: 5/5, left 5/5, extension: right: 4-/5, left 4-/5, wrist extension: right: 3-/5, left: 2-/5  Time 12   Period Weeks   Status Ongoing     Plan - 07/22/21 1737  Clinical Impression  Statement Pt. Continues to  tolerate Manual therapy, there. Ex., and ROM well today. Pt. Was able to tolerate 3# dowel ex. Pt. continues to present with tightness at the bilateral thumb webspace, and bilateral wrist flexor tightness, as well as digit MP, PIP, and DIP extensor tightness which continues to limit her ability to complete ADL tasks efficiently. Pt. continues to benefit from working on impoving ROM in order to work towards increasing bilateral hand grasp on objects, and increase engagement of bilateral hands during ADLs, and IADL tasks.               OT Occupational Profile and History Detailed Assessment- Review of Records and additional review of physical, cognitive, psychosocial history related to current functional performance    Occupational performance deficits (Please refer to evaluation for details): ADL's;IADL's;Leisure;Social Participation    Body Structure / Function / Physical Skills ADL;Continence;Dexterity;Flexibility;Strength;ROM;Balance;Coordination;FMC;IADL;Endurance;UE functional use;Decreased knowledge of use of DME;[facility]    Psychosocial Skills Coping Strategies    Rehab Potential Fair    Clinical Decision Making Several treatment options, min-mod task modification necessary    Comorbidities Affecting Occupational Performance: Presence of comorbidities impacting occupational performance    Comorbidities impacting occupational performance description: contractures of bilateral hands, dependent transfers,    Modification or Assistance to Complete Evaluation  No modification of tasks or assist necessary to complete eval    OT Frequency 2x / week    OT Duration 12 weeks    OT Treatment/Interventions Self-care/ADL training;Cryotherapy;Therapeutic exercise;DME and/or AE instruction;Balance training;Neuromuscular education;Manual Therapy;Splinting;Moist Heat;Passive range of motion;Therapeutic activities;Patient/family education    Plan continue to progress ROM of digits, wrists  and shoulders as it pertains to completion of ADL tasks that pt values.    Consulted and Agree with Plan of Care Patient            Olegario Messier, MS, OTR/L   08/04/22, 6:02 PM

## 2022-08-09 ENCOUNTER — Ambulatory Visit: Payer: Medicare HMO

## 2022-08-09 ENCOUNTER — Ambulatory Visit: Payer: Medicare HMO | Admitting: Occupational Therapy

## 2022-08-11 ENCOUNTER — Ambulatory Visit: Payer: Medicare HMO | Admitting: Occupational Therapy

## 2022-08-11 ENCOUNTER — Ambulatory Visit: Payer: Medicare HMO

## 2022-08-11 DIAGNOSIS — M6281 Muscle weakness (generalized): Secondary | ICD-10-CM | POA: Diagnosis not present

## 2022-08-11 DIAGNOSIS — R262 Difficulty in walking, not elsewhere classified: Secondary | ICD-10-CM

## 2022-08-11 DIAGNOSIS — S72001A Fracture of unspecified part of neck of right femur, initial encounter for closed fracture: Secondary | ICD-10-CM

## 2022-08-11 DIAGNOSIS — R269 Unspecified abnormalities of gait and mobility: Secondary | ICD-10-CM

## 2022-08-11 DIAGNOSIS — R278 Other lack of coordination: Secondary | ICD-10-CM

## 2022-08-11 DIAGNOSIS — S14129S Central cord syndrome at unspecified level of cervical spinal cord, sequela: Secondary | ICD-10-CM

## 2022-08-11 NOTE — Therapy (Signed)
Occupational Therapy Neuro Treatment Note     Patient Name: Sherry [name] MRN: [id number] DOB:February 17, 1934, 88 y.o., female Today's Date: 08/11/2022                                         PCP: Dr. Tillman Abide REFERRING PROVIDER: Dr. Tillman Abide   OT End of Session - 08/11/22 1324     Visit Number [id number]    Number of Visits 96    Date for OT Re-Evaluation 09/27/22    Authorization Time Period Progress report period starting 12/14/2021    OT Start Time 1100    OT Stop Time 1142    OT Time Calculation (min) 42 min    Equipment Utilized During Treatment moist heat    Activity Tolerance Patient tolerated treatment well    Behavior During Therapy WFL for tasks assessed/performed                  Past Medical History:  Diagnosis Date   Acute blood loss anemia    Arthritis    Cancer (HCC)    skin   Central cord syndrome at C6 level of cervical spinal cord (HCC) 11/29/2017   Hypertension    Protein-calorie malnutrition, severe (HCC) 01/24/2018   S/P insertion of IVC (inferior vena caval) filter 01/24/2018   Tetraparesis (HCC)    Past Surgical History:  Procedure Laterality Date   ANTERIOR CERVICAL DECOMP/DISCECTOMY FUSION N/A 11/29/2017   Procedure: Cervical five-six, six-seven Anterior Cervical Decompression Fusion;  Surgeon: Jadene Pierini, MD;  Location: [facility] OR;  Service: Neurosurgery;  Laterality: N/A;  Cervical five-six, six-seven Anterior Cervical Decompression Fusion   CATARACT EXTRACTION     FEMUR IM NAIL Left 08/16/2021   Procedure: INTRAMEDULLARY (IM) NAIL FEMORAL;  Surgeon: Deeann Saint, MD;  Location: [facility] ORS;  Service: Orthopedics;  Laterality: Left;   IR IVC FILTER PLMT / S&I /IMG GUID/MOD SED  12/08/2017   Patient Active Problem List   Diagnosis Date Noted   Age-related osteoporosis without current pathological fracture 07/27/2022   Mild recurrent major depression (HCC) 07/27/2022   Hypertensive heart disease with other congestive heart failure  (HCC) 02/14/2022   Chronic indwelling Foley catheter 02/14/2022   Closed hip fracture (HCC) 08/15/2021   DVT (deep venous thrombosis) (HCC) 08/15/2021   Quadriplegia (HCC) 08/15/2021   Fall 08/15/2021   Constipation due to slow transit 08/31/2018   Trauma 06/05/2018   Neuropathic pain 06/05/2018   Neurogenic bowel 06/05/2018   Vaginal yeast infection 01/30/2018   Healthcare-associated pneumonia 01/25/2018   Chronic allergic rhinitis 01/24/2018   Depression with anxiety 01/24/2018   UTI due to Klebsiella species 01/24/2018   Protein-calorie malnutrition, severe (HCC) 01/24/2018   S/P insertion of IVC (inferior vena caval) filter 01/24/2018   Tetraparesis (HCC) 01/20/2018   Neurogenic bladder 01/20/2018   Reactive depression    Benign essential HTN    Acute postoperative anemia due to expected blood loss    Central cord syndrome at C6 level of cervical spinal cord (HCC) 11/29/2017   Allergy to alpha-gal 11/25/2016   SCC (squamous cell carcinoma) 04/08/2014    REFERRING DIAG: Central Cord Syndrome at C6 of the Cervical Spinal Cord, Fall with Bilateral Closed Hip Fractures, with ORIF repair with Intramedullary Nailing of the Left Hip Fracture.  THERAPY DIAG:  Muscle weakness (generalized)  Rationale for Evaluation and Treatment Rehabilitation  PERTINENT HISTORY: Patient s/p  fall November 29, 2017 resulting in diagnosis of central cord syndrome at C 6 level.  She has had therapy in multiple venues but with recent move to [facility], therapy staff recommended she seek outpatient therapy for her needs. Pt. Had a recent fall with Bilateral Closed Hip Fractures, with ORIF repair with Intramedullary Nailing of the Left Hip Fracture.   PRECAUTIONS: None  SUBJECTIVE:   Pt. reports feeling better today.  PAIN:  Are you having pain? No  OBJECTIVE:    Today's Treatment:   Manual Therapy:   Pt. tolerated soft tissue massage to the volar, and dorsal surface of each digit on the  bilateral hands, and thenar eminence secondary to stiffness following moist heat modality. Soft tissue mobilization was performed for Carpal, and metacarpal spread stretches. Manual therapy was performed independent of, and in preparation for therapeutic Ex.   Therapeutic Ex.    Pt. tolerated PROM followed by AROM bilateral wrist extension, PROM for bilateral digit MP, PIP, and DIP flexion, and extension, thumb radial, and palmar abduction. Pt. performed 3# dowel ex. for UE strengthening secondary to weakness. Bilateral shoulder flexion, chest press, circular patterns, and elbow flexion/extension were performed. Emphasis was placed on left wrist extension with moist heat modality. Pt. worked on the BUE for strengthening, and reciprocal motion using the UBE while seated for 8 min. with no resistance. Constant monitoring was provided.    PATIENT EDUCATION: RUE Distal ROM, and strength Person educated: Patient Education method: Explanation and Demonstration Education comprehension: verbalized understanding, returned demonstration, and needs further education   HOME EXERCISE PROGRAM Continue with ongoing HEPs  Measurements:  12/09/2021:  Shoulder flexion: R: 120(136), L: 138(150) Shoulder abduction: R: 108(120), L: 110(120) Elbow: R: 0-148, L: 0-146 Wrist flexion: R: 55(62), L: 78 Writ extension: R: 34(55), L: 3(4) RD: R: 12(20), L: 16(26) UD: R: 8(24), L: 6(18) Thumb radial abduction: R: 11(20), L: 8(20) Digit flexion to the [facility]: R:  2nd: 5cm(3.5cm), 3rd: 7.5 cm(5cm), 4th: 7cm(3cm), 5th: 6cm(5.5cm) L: 2nd: 8cm(8cm), 3rd: 8cm(8cm), 4th: 7.5cm(7cm), 5th: 7cm(7cm)  02/03/2022:   Shoulder flexion: R: 122(138), L: 148(154) Shoulder abduction: R: 109(125), L: 125(125 Elbow: R: 0-148, L: 0-146 Wrist flexion: R: 60(68), L: 79 Writ extension: R: 40(55), L: 3(10) RD: R: 14(24), L: 20(28) UD: R: 8(24), L: 6(18) Thumb radial abduction: R: 20(25), L: 8(20) Digit flexion to the [facility]: R:   2nd: 4.5cm(3cm), 3rd: 6.5cm(4.5cm), 4th: 6.5 cm(3cm), 5th: 4.5cm(5cm) L: 2nd: 8cm(8cm), 3rd: 8cm(7cm), 4th: 7.5cm(7cm), 5th: 6.5cm(6.5cm)   04/07/2022:   Shoulder flexion: R: 125(150), L: 160(160) Shoulder abduction: R: 109(125), L: 125(125 Elbow: R: 0-148, L: 0-146 Wrist flexion: R: 60(68), L: 79 Writ extension: R: 40(55), L: 5(18) RD: R: 14(24), L: 20(28) UD: R: 18(24), L: 8(18) Thumb radial abduction: R: 20(25), L: 8(20) Digit flexion to the [facility]: R:  2nd: 4.5 cm(4 cm), 3rd: 6.5cm(3cm), 4th: 7 cm (5cm), 5th: 5cm(5cm) L: 2nd: 8cm(8cm), 3rd: 8cm(7cm), 4th: 7.5cm(7cm), 5th: 7cm(7cm)   07/05/2022:   Shoulder flexion: R: 125(154), L: 160(160) Shoulder abduction: R: 95(130), L: 143(143) Elbow: R: 0-148, L: 0-146 Wrist flexion: R: 60(68), L: 79 Writ extension: R: 40(60), L: 0(12) RD: R: 10(24), L: 12(20) UD: R: 16(24), L: 8(16) Thumb radial abduction: R: 20(25), L: 8(20) Digit flexion to the [facility]: R:  2nd: 4 cm(4cm), 3rd: 6.5cm( 3.5cm), 4th: 7 cm (5cm), 5th: 6cm(5cm) L: 2nd: 8cm(8cm), 3rd: 8cm(7cm), 4th: 8cm(7cm), 5th: 7cm(7cm)            OT Long Term Goals -  07/06/21 1106                                                                                          Target Date:  09/27/2022      OT LONG TERM GOAL #1   Title Patient and caregiver will demonstrate understanding of home exercise program for ROM.    Baseline 12/09/2021: Pt. Reports the restorative ROM program has not resumed at the facility. 10/28/2021: Pt. Is attempting exercises/stretches at home. The facility restorative aides have not resumed exercises. 09/14/2021:  Pt. Reports inconsistencies with restorative ROM at  her care home. Pt/caregiver education to be provided about ongoing ROM needs.    Time 12    Period Weeks    Status Deferred     OT LONG TERM GOAL #2   Title Pt.  will improve BUE strength to be able to sustain her UE's in elevation to efficiently, and effectively perform hair care, and self-grooming  tasks.   Baseline 02/03/2022: Pt. has improved, and is now able to sustain her Bilateral UEs in elevation long enough to efficiently, and effectively perform haircare, and self-grooming tasks. 12/09/2021: right shoulder 4-/5, left shoulder 4/5. Pt. has improved to be able to reach up for hair care, and self-grooming tasks. 10/28/2021: Right shoulder 3+/5, Left shoulder 4-/5 09/14/2021:  BUE shoulder strength: 3+/5 for shoulder flexion, and abduction.   Time 12    Period Weeks    Status Achieved               OT LONG TERM GOAL #3   Title Patient will demonstrate improved composite finger flexion to be able to firmly hold  and use adaptive devices during ADLs, and IADLs.   Baseline 07/05/2022: Pt. presents with digit MP, PIP, and DIP extensor tightness limiting her ability to securely grip objects in her bilateral hands.  04/07/2022: Pt. Has improved with right 2nd, and 3rd digit flexion towards the [facility]. Pt. Continues to have difficulty securely holding and applying deodorant.02/03/2022: Pt. Has improved with digit flexion, however continues to have difficulty securely holding and using deoderant. 12/09/2021: Bilateral hand/digit MP, PIP, and DIP extension tightness limits her ability achieve digit flexion to hold, and apply deodorant. 10/28/2021:  Pt. continues to have difficulty holding the deodorant. 01/12/2021: Pt. Presents with limited digit extension. Pt. Is able to initiate holding deodorant, however is unable to hold it while using it.   Time 12    Period Weeks    Status Revised   Target Date                  OT LONG TERM GOAL #4   Title Pt. Will improve bilateral wrist extension in preparation for anticipating, and initiating reaching for objects at the table.    Baseline 07/19/2022: Bilateral wrist extension in limited. 07/05/2022: Right: 40(60) Left: 0(12) 04/07/2022: Rght: 40(55) Left: 5(18) 02/03/2022: Right: 34(55) Left: 3(4) 12/09/2021: Right  34(55)  Left  3(4) 10/28/2021:  Right 22(38), Left  0(15) 09/14/2021:  Right: 17(35), left 2(15)   Time 12   Period weeks   Status Ongoing   Target Date  OT LONG TERM GOAL #5   Title Pt. will write, and sign her name with 100% legibility, and modified independence.    Baseline 07/19/2022:  Pt. continues to be able to consistently fill out daily meal menus, and puzzles. 6/03/204: Pt. Is consistently filling out  daily meal menus, and puzzles. 04/07/2022: Pt. Reports that she continues to present with shaky appearing writing. Pt.reports that she is writing more when filling out her menu, and complete puzzles 02/03/2022: Pt. Continues to report legibility appears "shaky" when attempting signing her name. Pt.reports that she is writing more when filling out her menu, and complete puzzles.12/09/2021: Pt. Reports legibility appears "shaky" when attempting signing her name. Pt.reports that she is writing more when filling out her menu. 10/28/2021: pt. is able to sign her name, however legibility continues to be inconsistent. Pt. Reports "shaky appearance". 09/14/2021: Pt. continues to fill out her daily menu and complete puzzles. Pt. Continues to present with limited writing legibility.   Time 12    Period Weeks    Status  Ongoing         OT LONG TERM GOAL  #6   TITLE Pt. will turn pages in a book with modified independence.    Baseline 02/03/2022: Pt. Is able to effectively turn pages of a book. 10/28/2021: Pt. Is able to consistently turn book pages. 09/14/2021: Pt. has difficulty turning pages.    Time 12    Period Weeks    Status Achieved       OT LONG TERM GOAL  #7  TITLE Pt. Will increase  bilateral lateral pinch strength by 2 lbs to be able to securely grasp items during ADLs, and IADL tasks.   Baseline 07/19/2022: TBD 07/05/2022: TBD 07/2022: NT-Pinch meter out for calibration. 02/03/2022: Right: 6#, Left: 4# 12/09/2021:  Right: 6#, Left: 4#   Time 12  Period Weeks  Status Ongoing           OT LONG TERM GOAL  #8  TITLE Pt. Will button a  sweater with large 1" buttons, and zip her jacket with modified independence.   Baseline 06/07/2022: Pt. is able to use a button hook to button a sweater. Pt. Reports that she does not wear button down clothing often.  04/07/2022: Pt. Has difficulty manipulating buttons, however was introduced to a buttonhook. Pt. Was able to hold, and use a buttonhook for buttons on a shirt. 02/03/2022: Pt. is unable to manipulate buttons on her sweater, or zippers on her jacket.  Time 12  Period Weeks  Status Achieved   OT LONG TERM GOAL  #9  TITLE Pt. will complete plant care with modified independence.  Baseline 07/19/2022: Pt. Continues to be able to water, and care for some  of her plants. Pt. Has more difficulty with plants that are harder to reach. 07/05/2022: pt. Is able to water, and care for some  of her plants. Pt. Has more difficulty with plants that are harder to reach. 04/07/2022: Pt. is now able to hold a cup and water her plants.02/03/2022: Pt. has difficulty caring for her plants.   Time 12  Period Weeks  Status Ongoing   OT LONG TERM GOAL  #10  TITLE Pt. will demonstrate adaptive techniques to assist with the efficiency of self-dressing, or morning care tasks.  Baseline 07/19/2022: Pt. continues to require assist with self-dressing/morning care tasks.   07/05/2022: Continue with goal.  04/07/2022: Pt. Continues to require assist with the efficiency of self-dressing, and morning care tasks.02/03/2022: Pt. requires  assist from caregivers 2/2 time limitations during morning care.   Time 12  Period Weeks  Status Ongoing   Title Pt.  will improve BUE strength to improve ADL, and IADL functioning.  Baseline 07/19/2022:  BUE strength continues to be limited. 07/05/2022: shoulder flexion: right 4-/5, abduction: 3+/5, elbow flexion: right: 5/5, left 5/5, extension: right: 4-/5, left 4-/5, wrist extension: right: 3-/5, left: 2-/5  Time 12   Period Weeks   Status Ongoing     Plan - 07/22/21 1737     Clinical  Impression Statement Pt. reports feeling better today. Pt. Continues to  tolerate Manual therapy, there. Ex., and ROM well today. Pt. was able to tolerate 3# dowel ex. Pt. continues to present with tightness at the bilateral thumb webspace, and bilateral wrist flexor tightness, as well as digit MP, PIP, and DIP extensor tightness which continues to limit her ability to complete ADL tasks efficiently. Pt. continues to benefit from working on impoving ROM in order to work towards increasing bilateral hand grasp on objects, and increase engagement of bilateral hands during ADLs, and IADL tasks.               OT Occupational Profile and History Detailed Assessment- Review of Records and additional review of physical, cognitive, psychosocial history related to current functional performance    Occupational performance deficits (Please refer to evaluation for details): ADL's;IADL's;Leisure;Social Participation    Body Structure / Function / Physical Skills ADL;Continence;Dexterity;Flexibility;Strength;ROM;Balance;Coordination;FMC;IADL;Endurance;UE functional use;Decreased knowledge of use of DME;[facility]    Psychosocial Skills Coping Strategies    Rehab Potential Fair    Clinical Decision Making Several treatment options, min-mod task modification necessary    Comorbidities Affecting Occupational Performance: Presence of comorbidities impacting occupational performance    Comorbidities impacting occupational performance description: contractures of bilateral hands, dependent transfers,    Modification or Assistance to Complete Evaluation  No modification of tasks or assist necessary to complete eval    OT Frequency 2x / week    OT Duration 12 weeks    OT Treatment/Interventions Self-care/ADL training;Cryotherapy;Therapeutic exercise;DME and/or AE instruction;Balance training;Neuromuscular education;Manual Therapy;Splinting;Moist Heat;Passive range of motion;Therapeutic activities;Patient/family education     Plan continue to progress ROM of digits, wrists and shoulders as it pertains to completion of ADL tasks that pt values.    Consulted and Agree with Plan of Care Patient            Olegario Messier, MS, OTR/L   08/11/22, 1:34 PM

## 2022-08-11 NOTE — Therapy (Addendum)
OUTPATIENT PHYSICAL THERAPY NEURO TREATMENT   Patient Name: Sherry [name] MRN: [id number] DOB:1934/11/13, 88 y.o., female Today's Date: 08/11/2022   PCP: Sherry Mealing, MD REFERRING PROVIDER: Deeann [name]   PT End of Session - 08/11/22 1252     Visit Number 58    Number of Visits 74    Date for PT Re-Evaluation 09/27/22    Progress Note Due on Visit 60    PT Start Time 1146    PT Stop Time 1226    PT Time Calculation (min) 40 min    Equipment Utilized During Treatment Gait belt    Activity Tolerance Patient tolerated treatment well    Behavior During Therapy WFL for tasks assessed/performed                             Past Medical History:  Diagnosis Date   Acute blood loss anemia    Arthritis    Cancer (HCC)    skin   Central cord syndrome at C6 level of cervical spinal cord (HCC) 11/29/2017   Hypertension    Protein-calorie malnutrition, severe (HCC) 01/24/2018   S/P insertion of IVC (inferior vena caval) filter 01/24/2018   Tetraparesis (HCC)    Past Surgical History:  Procedure Laterality Date   ANTERIOR CERVICAL DECOMP/DISCECTOMY FUSION N/A 11/29/2017   Procedure: Cervical five-six, six-seven Anterior Cervical Decompression Fusion;  Surgeon: Sherry Pierini, MD;  Location: [facility] OR;  Service: Neurosurgery;  Laterality: N/A;  Cervical five-six, six-seven Anterior Cervical Decompression Fusion   CATARACT EXTRACTION     FEMUR IM NAIL Left 08/16/2021   Procedure: INTRAMEDULLARY (IM) NAIL FEMORAL;  Surgeon: Sherry Saint, MD;  Location: [facility] ORS;  Service: Orthopedics;  Laterality: Left;   IR IVC FILTER PLMT / S&I /IMG GUID/MOD SED  12/08/2017   Patient Active Problem List   Diagnosis Date Noted   Age-related osteoporosis without current pathological fracture 07/27/2022   Mild recurrent major depression (HCC) 07/27/2022   Hypertensive heart disease with other congestive heart failure (HCC) 02/14/2022   Chronic indwelling Foley catheter  02/14/2022   Closed hip fracture (HCC) 08/15/2021   DVT (deep venous thrombosis) (HCC) 08/15/2021   Quadriplegia (HCC) 08/15/2021   Fall 08/15/2021   Constipation due to slow transit 08/31/2018   Trauma 06/05/2018   Neuropathic pain 06/05/2018   Neurogenic bowel 06/05/2018   Vaginal yeast infection 01/30/2018   Healthcare-associated pneumonia 01/25/2018   Chronic allergic rhinitis 01/24/2018   Depression with anxiety 01/24/2018   UTI due to Klebsiella species 01/24/2018   Protein-calorie malnutrition, severe (HCC) 01/24/2018   S/P insertion of IVC (inferior vena caval) filter 01/24/2018   Tetraparesis (HCC) 01/20/2018   Neurogenic bladder 01/20/2018   Reactive depression    Benign essential HTN    Acute postoperative anemia due to expected blood loss    Central cord syndrome at C6 level of cervical spinal cord (HCC) 11/29/2017   Allergy to alpha-gal 11/25/2016   SCC (squamous cell carcinoma) 04/08/2014    ONSET DATE: 08/15/2021 (fall with B hip fx); Initial injury was in 2019- quadraplegia due to central cord syndrome of C6 secondary to neck fracture.   REFERRING DIAG: Bilateral Hip fx; Left Open- s/p Left IM nail femoral on 08/16/2021; Right closed  THERAPY DIAG:  Muscle weakness (generalized)  Other lack of coordination  Central cord syndrome, sequela (HCC)  Abnormality of gait and mobility  Difficulty in walking, not elsewhere classified  Closed fracture of both hips,  initial encounter [facility])  Rationale for Evaluation and Treatment Rehabilitation  SUBJECTIVE:   SUBJECTIVE STATEMENT:   Patient reports no changes since last visit. Denies any pain but does endorse some LE tightness.     Pt accompanied by:  Sherry [name]-   PERTINENT HISTORY: Patient was previous patient (see previous episode for specifics) She experienced a fall at home on 08/15/2021 and was in Hospital from 08/15/2021- 08/20/2021.  Most recent history per Dr. Shonna [name] on 08/20/2021.  Sherry A  [name] is a 88 y.o. 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   Patient has history of quadriplegia, and has been doing PT/OT with improvement of function. Pt has some use of both arms and can feed herself. She can walk a little bit with assistance. Pt accidentally fell at about 930 this morning after taking shower.  She injured her hips. Hospital Course:    Closed left hip fracture [facility]) s/p mechanical fall Closed right hip fracture (?old) Acute blood loss anemia --pt is s/p  INTRAMEDULLARY (IM) NAIL FEMORAL-left on 7/16  PAIN:  Are you having pain? NO: NPRS scale: 0/10 Pain location: Right hip Pain description: Ache Aggravating factors: any active Movement of right side Relieving factors: Rest  PRECAUTIONS: Fall  WEIGHT BEARING RESTRICTIONS Yes Bilateral non-weight bearing B LE's  FALLS: Has patient fallen in last 6 months? Yes. Number of falls 1  LIVING ENVIRONMENT: Lives with: lives with their family and lives in a nursing home Lives in: Other Long term care at [facility] Stairs: No Has following equipment at home: Wheelchair (power), Ramped entry, and hoyer lift  PLOF: Needs assistance with ADLs, Needs assistance with homemaking, Needs assistance with gait, Needs assistance with transfers, and Leisure: Work puzzles, read, Watch TV  PATIENT GOALS Patient goal is to walk; Be able to strengthen my arms for better use  OBJECTIVE:   DIAGNOSTIC FINDINGS: Overall cognitive statusEXAM: DG HIP (WITH OR WITHOUT PELVIS) 2-3V LEFT; DG HIP (WITH OR WITHOUT PELVIS) 2-3V RIGHT   COMPARISON:  CT of the abdomen and pelvis 01/21/2020   FINDINGS: Left proximal femur fracture noted just below the intertrochanteric region. Marked ventral angulation noted on the  cross-table image. Slight varus angulation present   Comminuted intratrochanteric fracture present on the right also with marked ventral angulation.   IMPRESSION: 1. Bilateral hip fractures. 2. Comminuted intertrochanteric fracture of the right hip. 3. Angulated fracture of the proximal left femur just below the intertrochanteric region.     Electronically Signed   By: Marin Roberts M.D.   On: 08/15/2021 11:54:   COGNITION:  Within functional limits for tasks assessed   SENSATION: Light touch: Impaired   EDEMA:  None  Strength R/L 2-/2- Shoulder flexion (anterior deltoid/pec major/coracobrachialis, axillary n. (C5-6) and musculocutaneous n. (C5-7)) 2-/3+ (within available ROM around 130 on lef)Shoulder abduction (deltoid/supraspinatus, axillary/suprascapular n, C5) 3+/3+ Shoulder external rotation (infraspinatus/teres minor) 4/4 Shoulder internal rotation (subcapularis/lats/pec major) 3+/3+ Shoulder extension (posterior deltoid, lats, teres major, axillary/thoracodorsal n.) 3+/3+ Shoulder horizontal abduction 4/4 Elbow flexion (biceps brachii, brachialis, brachioradialis, musculoskeletal n, C5-6) 4/4 Elbow extension (triceps, radial n, C7) 2-/2- Wrist Extension 3+/3+ Wrist Flexion   LOWER EXTREMITY MMT:    MMT Right 01/11/22 Left 01/11/22  Hip flexion 2- 2-  Hip extension 2- 2-  Hip abduction 2- 2-  Hip adduction 2- 3  Hip internal rotation 2- 2-  Hip external rotation 2- 2-  Knee flexion 2- 2-  Knee extension 2+ 3+  Ankle dorsiflexion 2- NT - boot  Ankle plantarflexion 2- NT- boot  Ankle inversion 2- NT- boot  Ankle eversion 2- NT- boot  (Blank rows = not tested)  BED MOBILITY:  Unable to test due to NWB status of BLE- Patient currently dependent with all bed mobility using hoyer and [name] assist  TRANSFERS: Currently BLE NWB- Dependent on use of hoyer for all transfers  FUNCTIONAL TESTs:   PATIENT SURVEYS:  FOTO 12  TODAY'S  TREATMENT:    Therapeutic Exercises:     PROM- B hip rotators, adductors, quads, Hamstrings, gastrocs x 15 min   AROM - Left Hip flex and AAROM Right hip 2 sets x 15 reps   AROM- Right Knee ext and resistive Left LE (3# AW) - 2 sets x 15 reps   AAROM - B knee flex 2 sets x 15 reps   Manual resistive hip abd x 5 sec x 15 reps    Manual resistive Hip add x 5 sec x 15 reps     Pt educated throughout session about proper posture and technique with exercises. Improved exercise technique, movement at target joints, use of target muscles after min to mod verbal, visual, tactile cues.   PATIENT EDUCATION: Education details: PT plan of care/use of resistive bands Person educated: Patient Education method: Explanation, Demonstration, Tactile cues, and Verbal cues Education comprehension: verbalized understanding, returned demonstration, verbal cues required, tactile cues required, and needs further education   HOME EXERCISE PROGRAM: No changes at this time  Access Code: [facility] URL: [email or web address] Date: 11/23/2021 Prepared by: Precious Bard Exercises - Seated Gluteal Sets - 2 x daily - 7 x weekly - 2 sets - 10 reps - 5 hold - Seated Quad Set - 2 x daily - 7 x weekly - 2 sets - 10 reps - 5 hold - Seated Long Arc Quad - 2 x daily - 7 x weekly - 2 sets - 10 reps - 5 hold - Seated March - 2 x daily - 7 x weekly - 2 sets - 10 reps - 5 hold - Seated Hip Abduction - 2 x daily - 7 x weekly - 2 sets - 10 reps - 5 hold - Seated Shoulder Shrugs - 2 x daily - 7 x weekly - 2 sets - 10 reps - 5 hold - Wheelchair Pressure Relief - 2 x daily - 7 x weekly - 2 sets - 10 reps - 5 hold  GOALS: Goals reviewed with patient? Yes  SHORT TERM GOALS: Target date: 02/22/2022  Pt will be independent with initial UE strengthening HEP in order to improve strength and balance in order to decrease fall risk and improve function at home and work. Baseline: 10/19/2021- patient with no formal UE HEP;  12/02/2021= Patient verbalized knowledge of HEP including use of theraband for UE strengthening.  Goal status: GOAL MET  LONG TERM GOALS: Target date: 09/27/2022  Pt will be independent with final for UE/LE HEP in order to improve strength and balance in order to decrease fall risk and improve function at home and work. Baseline: Patient is currently BLE NWB and unable to participate in HEP. Has order for UE strengthening. 01/11/2022- Patient now able to participate in LE strengthening although NWB still- good understanding for some basic exercises. Will keep goal active to incorporate progressive LE strengthening exercises. 04/07/2022= Patient  still participating in progressive LE seated HEP and has no questions at this time.  Goal status: MET  2.  Pt will improve FOTO to target score of 35  to display perceived improvements in ability to complete ADL's.  Baseline: 10/19/2021= 12; 12/02/2021= 17; 01/11/2022=17; 04/07/2022= 17; 07/05/2022=17 Goal status: Ongoing  3.  Pt will increase strength of B UE  by at least 1/2 MMT grade in order to demonstrate improvement in strength and function  Baseline: patient has range of 2-/5 to 4/5 BUE Strength; 12/02/2021= 4/5 except with wrist ext. 01/11/2022= 4/5 B UE strength expept for wrist Goal status: Goal revised-no longer appropriate- working with OT on all UE strengthening.   4. Pt. Will increase strength of RLE by at least 1/2 MMT grade in order to demonstrate improvement in Standing/transfers.  Baseline: 2-/5 Right hip flex/knee ext/flex; 04/07/2022= 2- with right hip flex/knee ext (lacking 28 deg from zero) 4/8= Patient able to ext right knee lacking 18 deg from zero. 07/05/2022=Left knee approx 8 deg from zero and right knee = 23 deg from zero  Goal status: Progressing  5. Pt. Will demo ability to stand pivot transfer with max assist for improved functional mobility and less dependent need on mechanical device.   Baseline: Dependent on hoyer lift for all  transfers; 04/07/2022- Unable to test secondary to patient with recent UTI and right hand procedure - will attempt to reassess next visit. ; 04/12/22: Unable to successfully stand due to feet plates on loaned power w/c; 05/10/2022- Patient still having to use rental chair and has not received her original power w/c back to practice SPT. 07/04/2021= Patient just received new power w/c and attempted standing from 1st time today- Patient able to stand with max assist today from w/c and did not attempt to perform pivot transfer- difficulty with static standing and placing weight on right LE.   Goal status: ONGOING  6. Pt. Will demonstrate improved functional LE strength as seen by ability to stand > 2 min for improved transfer ability and pregait abilities.   Baseline: not assessed today due to recent dx: UTI; 04/07/2022- Unable to test secondary to patient with recent UTI and right hand procedure - will attempt to reassess next visit; 04/12/22: Unable to successfully stand due to feet plates on loaned power w/c4/09/2022- Patient still having to use rental chair and has not received her original power w/c back to practice Standing. 07/05/2022- Patient able to stand with max assist at trunk today for 2 min 3 sec. Will keep goal active to ensure consistency.   Goal status: ONGOING  ASSESSMENT:  CLINICAL IMPRESSION: Parallel bars were unavailable for session today and patient agreeable to stretch and LE strengthening. She was able to demonstrate continued use of resistance with left LE and good eccentric control. She exhibited some fatigue early with right knee ext and demo ability to perform near full ROM with right knee ext.  Pt will continue to benefit from skilled PT services to optimize independence and reduced [name] support.    OBJECTIVE IMPAIRMENTS Abnormal gait, decreased activity tolerance, decreased balance, decreased coordination, decreased endurance, decreased mobility, difficulty walking, decreased ROM,  decreased strength, hypomobility, impaired flexibility, impaired UE functional use, postural dysfunction, and pain.   ACTIVITY LIMITATIONS carrying, lifting, bending, sitting, standing, squatting, sleeping, stairs, transfers, bed mobility, continence, bathing, toileting, dressing, self feeding, reach over head, hygiene/grooming, and caring for others  PARTICIPATION LIMITATIONS: meal prep, cleaning, laundry, medication management, personal finances, interpersonal relationship, driving, shopping, community activity, and  yard work  PERSONAL FACTORS Age, Time since onset of injury/illness/exacerbation, and 1-2 comorbidities: HTN, cervical Sx  are also affecting patient's functional outcome.   REHAB POTENTIAL: Good  CLINICAL DECISION MAKING: Evolving/moderate complexity  EVALUATION COMPLEXITY: Moderate  PLAN: PT FREQUENCY: 1-2x/week  PT DURATION: 12 weeks  PLANNED INTERVENTIONS: Therapeutic exercises, Therapeutic activity, Neuromuscular re-education, Balance training, Gait training, Patient/Family education, Self Care, Joint mobilization, DME instructions, Dry Needling, Electrical stimulation, Wheelchair mobility training, Spinal mobilization, Cryotherapy, Moist heat, and Manual therapy  PLAN FOR NEXT SESSION: Continue with weight bearing activities,  practice standing next visit back in // bars and LE strengthening as appropriate.   Louis Meckel, PT Physical Therapist- [facility]  08/11/2022, 1:06 PM

## 2022-08-12 ENCOUNTER — Ambulatory Visit: Payer: Medicare HMO | Admitting: Occupational Therapy

## 2022-08-16 ENCOUNTER — Ambulatory Visit: Payer: Medicare HMO | Admitting: Occupational Therapy

## 2022-08-16 ENCOUNTER — Ambulatory Visit: Payer: Medicare HMO

## 2022-08-16 DIAGNOSIS — R262 Difficulty in walking, not elsewhere classified: Secondary | ICD-10-CM

## 2022-08-16 DIAGNOSIS — R269 Unspecified abnormalities of gait and mobility: Secondary | ICD-10-CM

## 2022-08-16 DIAGNOSIS — R2681 Unsteadiness on feet: Secondary | ICD-10-CM

## 2022-08-16 DIAGNOSIS — R278 Other lack of coordination: Secondary | ICD-10-CM

## 2022-08-16 DIAGNOSIS — M6281 Muscle weakness (generalized): Secondary | ICD-10-CM

## 2022-08-16 DIAGNOSIS — R140 Abdominal distension (gaseous): Secondary | ICD-10-CM

## 2022-08-16 DIAGNOSIS — S14129S Central cord syndrome at unspecified level of cervical spinal cord, sequela: Secondary | ICD-10-CM

## 2022-08-16 DIAGNOSIS — S72001A Fracture of unspecified part of neck of right femur, initial encounter for closed fracture: Secondary | ICD-10-CM

## 2022-08-16 DIAGNOSIS — R2689 Other abnormalities of gait and mobility: Secondary | ICD-10-CM

## 2022-08-16 NOTE — Therapy (Signed)
OUTPATIENT PHYSICAL THERAPY NEURO TREATMENT   Patient Name: Sherry [name] MRN: [id number] DOB:10-Aug-1934, 88 y.o., female Today's Date: 08/16/2022   PCP: Earnestine Mealing, MD REFERRING PROVIDER: Deeann Saint   PT End of Session - 08/16/22 1305     Visit Number 59    Number of Visits 74    Date for PT Re-Evaluation 09/27/22    Progress Note Due on Visit 60    PT Start Time 1145    PT Stop Time 1225    PT Time Calculation (min) 40 min    Activity Tolerance Patient tolerated treatment well;Patient limited by fatigue;No increased pain    Behavior During Therapy WFL for tasks assessed/performed                             Past Medical History:  Diagnosis Date   Acute blood loss anemia    Arthritis    Cancer (HCC)    skin   Central cord syndrome at C6 level of cervical spinal cord (HCC) 11/29/2017   Hypertension    Protein-calorie malnutrition, severe (HCC) 01/24/2018   S/P insertion of IVC (inferior vena caval) filter 01/24/2018   Tetraparesis (HCC)    Past Surgical History:  Procedure Laterality Date   ANTERIOR CERVICAL DECOMP/DISCECTOMY FUSION N/A 11/29/2017   Procedure: Cervical five-six, six-seven Anterior Cervical Decompression Fusion;  Surgeon: Jadene Pierini, MD;  Location: [facility] OR;  Service: Neurosurgery;  Laterality: N/A;  Cervical five-six, six-seven Anterior Cervical Decompression Fusion   CATARACT EXTRACTION     FEMUR IM NAIL Left 08/16/2021   Procedure: INTRAMEDULLARY (IM) NAIL FEMORAL;  Surgeon: Deeann Saint, MD;  Location: [facility] ORS;  Service: Orthopedics;  Laterality: Left;   IR IVC FILTER PLMT / S&I /IMG GUID/MOD SED  12/08/2017   Patient Active Problem List   Diagnosis Date Noted   Age-related osteoporosis without current pathological fracture 07/27/2022   Mild recurrent major depression (HCC) 07/27/2022   Hypertensive heart disease with other congestive heart failure (HCC) 02/14/2022   Chronic indwelling Foley catheter  02/14/2022   Closed hip fracture (HCC) 08/15/2021   DVT (deep venous thrombosis) (HCC) 08/15/2021   Quadriplegia (HCC) 08/15/2021   Fall 08/15/2021   Constipation due to slow transit 08/31/2018   Trauma 06/05/2018   Neuropathic pain 06/05/2018   Neurogenic bowel 06/05/2018   Vaginal yeast infection 01/30/2018   Healthcare-associated pneumonia 01/25/2018   Chronic allergic rhinitis 01/24/2018   Depression with anxiety 01/24/2018   UTI due to Klebsiella species 01/24/2018   Protein-calorie malnutrition, severe (HCC) 01/24/2018   S/P insertion of IVC (inferior vena caval) filter 01/24/2018   Tetraparesis (HCC) 01/20/2018   Neurogenic bladder 01/20/2018   Reactive depression    Benign essential HTN    Acute postoperative anemia due to expected blood loss    Central cord syndrome at C6 level of cervical spinal cord (HCC) 11/29/2017   Allergy to alpha-gal 11/25/2016   SCC (squamous cell carcinoma) 04/08/2014    ONSET DATE: 08/15/2021 (fall with B hip fx); Initial injury was in 2019- quadraplegia due to central cord syndrome of C6 secondary to neck fracture.   REFERRING DIAG: Bilateral Hip fx; Left Open- s/p Left IM nail femoral on 08/16/2021; Right closed  THERAPY DIAG:  Muscle weakness (generalized)  Other lack of coordination  Central cord syndrome, sequela (HCC)  Abnormality of gait and mobility  Difficulty in walking, not elsewhere classified  Closed fracture of both hips, initial encounter (HCC)  Unsteadiness on feet  Abdominal distention  Other abnormalities of gait and mobility  Rationale for Evaluation and Treatment Rehabilitation  SUBJECTIVE:   SUBJECTIVE STATEMENT:   Patient reports no changes since last visit. Still getting used to driving her new powerchair.     Pt accompanied by:  Paid caregiver-   PERTINENT HISTORY: Patient was previous patient (see previous episode for specifics) She experienced a fall at home on 08/15/2021 and was in Hospital from  08/15/2021- 08/20/2021.  Most recent history per Dr. Shonna Chock on 08/20/2021.  Sherry [name] is a 88 y.o. 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   Patient has history of quadriplegia, and has been doing PT/OT with improvement of function. Pt has some use of both arms and can feed herself. She can walk a little bit with assistance. Pt accidentally fell at about 930 this morning after taking shower.  She injured her hips. Hospital Course:    Closed left hip fracture (HCC) s/p mechanical fall Closed right hip fracture (?old) Acute blood loss anemia --pt is s/p  INTRAMEDULLARY (IM) NAIL FEMORAL-left on 7/16  PAIN:  None   OBJECTIVE:    TODAY'S TREATMENT:  -P/ROM stretching of BLE: calves, glute max, quads (HS length WNL)  -active resisted unilateral leg press 2x10 bilat  -active assisted marching 2x10 bilat -upright sitting in WC, back unsupported x3 minutes (includes pillow toss/catch, trunk rotation c flexion or extension bilat) -isometric trunk extension into 2 pillows atop power chair       Pt educated throughout session about proper posture and technique with exercises. Improved exercise technique, movement at target joints, use of target muscles after min to mod verbal, visual, tactile cues.   PATIENT EDUCATION: Education details: PT plan of care/use of resistive bands Person educated: Patient Education method: Explanation, Demonstration, Tactile cues, and Verbal cues Education comprehension: verbalized understanding, returned demonstration, verbal cues required, tactile cues required, and needs further education   HOME EXERCISE PROGRAM: No changes at this time  Access Code: [facility] URL: [email or web address] Date: 11/23/2021  Prepared by: Precious Bard Exercises - Seated Gluteal Sets - 2 x daily - 7 x weekly - 2 sets - 10 reps - 5 hold - Seated Quad Set - 2 x daily - 7 x weekly - 2 sets - 10 reps - 5 hold - Seated Long Arc Quad - 2 x daily - 7 x weekly - 2 sets - 10 reps - 5 hold - Seated March - 2 x daily - 7 x weekly - 2 sets - 10 reps - 5 hold - Seated Hip Abduction - 2 x daily - 7 x weekly - 2 sets - 10 reps - 5 hold - Seated Shoulder Shrugs - 2 x daily - 7 x weekly - 2 sets - 10 reps - 5 hold - Wheelchair Pressure Relief - 2 x daily - 7 x weekly - 2 sets - 10 reps - 5 hold  GOALS: Goals reviewed with patient? Yes  SHORT TERM GOALS: Target date: 02/22/2022  Pt will be independent with initial UE strengthening HEP in order to improve strength and balance in order to decrease fall risk and improve function at home and work. Baseline: 10/19/2021- patient with no formal UE HEP; 12/02/2021= Patient verbalized knowledge of HEP including use of theraband for UE  strengthening.  Goal status: GOAL MET  LONG TERM GOALS: Target date: 09/27/2022  Pt will be independent with final for UE/LE HEP in order to improve strength and balance in order to decrease fall risk and improve function at home and work. Baseline: Patient is currently BLE NWB and unable to participate in HEP. Has order for UE strengthening. 01/11/2022- Patient now able to participate in LE strengthening although NWB still- good understanding for some basic exercises. Will keep goal active to incorporate progressive LE strengthening exercises. 04/07/2022= Patient still participating in progressive LE seated HEP and has no questions at this time.  Goal status: MET  2.  Pt will improve FOTO to target score of 35  to display perceived improvements in ability to complete ADL's.  Baseline: 10/19/2021= 12; 12/02/2021= 17; 01/11/2022=17; 04/07/2022= 17; 07/05/2022=17 Goal status: Ongoing  3.  Pt will increase strength of B UE  by at least 1/2 MMT grade in order to demonstrate  improvement in strength and function  Baseline: patient has range of 2-/5 to 4/5 BUE Strength; 12/02/2021= 4/5 except with wrist ext. 01/11/2022= 4/5 B UE strength expept for wrist Goal status: Goal revised-no longer appropriate- working with OT on all UE strengthening.   4. Pt. Will increase strength of RLE by at least 1/2 MMT grade in order to demonstrate improvement in Standing/transfers.  Baseline: 2-/5 Right hip flex/knee ext/flex; 04/07/2022= 2- with right hip flex/knee ext (lacking 28 deg from zero) 4/8= Patient able to ext right knee lacking 18 deg from zero. 07/05/2022=Left knee approx 8 deg from zero and right knee = 23 deg from zero  Goal status: Progressing  5. Pt. Will demo ability to stand pivot transfer with max assist for improved functional mobility and less dependent need on mechanical device.   Baseline: Dependent on hoyer lift for all transfers; 04/07/2022- Unable to test secondary to patient with recent UTI and right hand procedure - will attempt to reassess next visit. ; 04/12/22: Unable to successfully stand due to feet plates on loaned power w/c; 05/10/2022- Patient still having to use rental chair and has not received her original power w/c back to practice SPT. 07/04/2021= Patient just received new power w/c and attempted standing from 1st time today- Patient able to stand with max assist today from w/c and did not attempt to perform pivot transfer- difficulty with static standing and placing weight on right LE.   Goal status: ONGOING  6. Pt. Will demonstrate improved functional LE strength as seen by ability to stand > 2 min for improved transfer ability and pregait abilities.   Baseline: not assessed today due to recent dx: UTI; 04/07/2022- Unable to test secondary to patient with recent UTI and right hand procedure - will attempt to reassess next visit; 04/12/22: Unable to successfully stand due to feet plates on loaned power w/c4/09/2022- Patient still having to use rental chair and has  not received her original power w/c back to practice Standing. 07/05/2022- Patient able to stand with max assist at trunk today for 2 min 3 sec. Will keep goal active to ensure consistency.   Goal status: ONGOING  ASSESSMENT:  CLINICAL IMPRESSION: Continued with focus lengthening, strengthening, and core control for seated balance. Pt shows good control of trunk and endurance, able to advance to more advanced activity this date.  Pt will continue to benefit from skilled PT services to optimize independence and reduced caregiver support.    OBJECTIVE IMPAIRMENTS Abnormal gait, decreased activity tolerance, decreased balance, decreased coordination, decreased endurance,  decreased mobility, difficulty walking, decreased ROM, decreased strength, hypomobility, impaired flexibility, impaired UE functional use, postural dysfunction, and pain.   ACTIVITY LIMITATIONS carrying, lifting, bending, sitting, standing, squatting, sleeping, stairs, transfers, bed mobility, continence, bathing, toileting, dressing, self feeding, reach over head, hygiene/grooming, and caring for others  PARTICIPATION LIMITATIONS: meal prep, cleaning, laundry, medication management, personal finances, interpersonal relationship, driving, shopping, community activity, and yard work  PERSONAL FACTORS Age, Time since onset of injury/illness/exacerbation, and 1-2 comorbidities: HTN, cervical Sx  are also affecting patient's functional outcome.   REHAB POTENTIAL: Good  CLINICAL DECISION MAKING: Evolving/moderate complexity  EVALUATION COMPLEXITY: Moderate  PLAN: PT FREQUENCY: 1-2x/week  PT DURATION: 12 weeks  PLANNED INTERVENTIONS: Therapeutic exercises, Therapeutic activity, Neuromuscular re-education, Balance training, Gait training, Patient/Family education, Self Care, Joint mobilization, DME instructions, Dry Needling, Electrical stimulation, Wheelchair mobility training, Spinal mobilization, Cryotherapy, Moist heat, and Manual  therapy  PLAN FOR NEXT SESSION: Continue with weight bearing activities,  practice standing next visit back in // bars and LE strengthening as appropriate.   1:07 PM, 08/16/22 Rosamaria Lints, PT, DPT Physical Therapist - [facility]  Outpatient Physical Therapy- Main Campus [phone]     08/16/2022, 1:06 PM

## 2022-08-16 NOTE — Therapy (Addendum)
Occupational Therapy Neuro Treatment Note     Patient Name: Sherry [name] MRN: [id number] DOB:Nov 19, 1934, 88 y.o., female Today's Date: 08/16/2022                                         PCP: Dr. Tillman Abide REFERRING PROVIDER: Dr. Tillman Abide   OT End of Session - 08/16/22 1555     Visit Number [id number]    Number of Visits 96    Date for OT Re-Evaluation 09/27/22    OT Start Time 1100    OT Stop Time 1145    OT Time Calculation (min) 45 min    Equipment Utilized During Treatment moist heat    Activity Tolerance Patient tolerated treatment well    Behavior During Therapy WFL for tasks assessed/performed                  Past Medical History:  Diagnosis Date   Acute blood loss anemia    Arthritis    Cancer (HCC)    skin   Central cord syndrome at C6 level of cervical spinal cord (HCC) 11/29/2017   Hypertension    Protein-calorie malnutrition, severe (HCC) 01/24/2018   S/P insertion of IVC (inferior vena caval) filter 01/24/2018   Tetraparesis (HCC)    Past Surgical History:  Procedure Laterality Date   ANTERIOR CERVICAL DECOMP/DISCECTOMY FUSION N/A 11/29/2017   Procedure: Cervical five-six, six-seven Anterior Cervical Decompression Fusion;  Surgeon: Jadene Pierini, MD;  Location: [facility] OR;  Service: Neurosurgery;  Laterality: N/A;  Cervical five-six, six-seven Anterior Cervical Decompression Fusion   CATARACT EXTRACTION     FEMUR IM NAIL Left 08/16/2021   Procedure: INTRAMEDULLARY (IM) NAIL FEMORAL;  Surgeon: Deeann Saint, MD;  Location: [facility] ORS;  Service: Orthopedics;  Laterality: Left;   IR IVC FILTER PLMT / S&I /IMG GUID/MOD SED  12/08/2017   Patient Active Problem List   Diagnosis Date Noted   Age-related osteoporosis without current pathological fracture 07/27/2022   Mild recurrent major depression (HCC) 07/27/2022   Hypertensive heart disease with other congestive heart failure (HCC) 02/14/2022   Chronic indwelling Foley catheter 02/14/2022   Closed  hip fracture (HCC) 08/15/2021   DVT (deep venous thrombosis) (HCC) 08/15/2021   Quadriplegia (HCC) 08/15/2021   Fall 08/15/2021   Constipation due to slow transit 08/31/2018   Trauma 06/05/2018   Neuropathic pain 06/05/2018   Neurogenic bowel 06/05/2018   Vaginal yeast infection 01/30/2018   Healthcare-associated pneumonia 01/25/2018   Chronic allergic rhinitis 01/24/2018   Depression with anxiety 01/24/2018   UTI due to Klebsiella species 01/24/2018   Protein-calorie malnutrition, severe (HCC) 01/24/2018   S/P insertion of IVC (inferior vena caval) filter 01/24/2018   Tetraparesis (HCC) 01/20/2018   Neurogenic bladder 01/20/2018   Reactive depression    Benign essential HTN    Acute postoperative anemia due to expected blood loss    Central cord syndrome at C6 level of cervical spinal cord (HCC) 11/29/2017   Allergy to alpha-gal 11/25/2016   SCC (squamous cell carcinoma) 04/08/2014    REFERRING DIAG: Central Cord Syndrome at C6 of the Cervical Spinal Cord, Fall with Bilateral Closed Hip Fractures, with ORIF repair with Intramedullary Nailing of the Left Hip Fracture.  THERAPY DIAG:  Muscle weakness (generalized)  Other lack of coordination  Rationale for Evaluation and Treatment Rehabilitation  PERTINENT HISTORY: Patient s/p fall November 29, 2017 resulting in  diagnosis of central cord syndrome at C 6 level.  She has had therapy in multiple venues but with recent move to [facility] therapy staff recommended she seek outpatient therapy for her needs. Pt. Had a recent fall with Bilateral Closed Hip Fractures, with ORIF repair with Intramedullary Nailing of the Left Hip Fracture.   PRECAUTIONS: None  SUBJECTIVE:   Pt. reports feeling good today.  PAIN:  Are you having pain? No  OBJECTIVE:    Today's Treatment:   Manual Therapy:   Pt. tolerated soft tissue massage to the volar, and dorsal surface of each digit on the bilateral hands, and thenar eminence secondary to  stiffness following moist heat modality. Soft tissue mobilization was performed for Carpal, and metacarpal spread stretches. Manual therapy was performed independent of, and in preparation for therapeutic Ex.   Therapeutic Ex.    Pt. tolerated PROM followed by AROM bilateral wrist extension, PROM for bilateral digit MP, PIP, and DIP flexion, and extension, thumb radial, and palmar abduction. Emphasis was placed on left wrist extension with moist heat modality 2/2 tightness. Pt. worked on the BUE for strengthening, and reciprocal motion using the UBE while seated for 6 min. & 26 sec.with no resistance. Constant monitoring was provided.    PATIENT EDUCATION: RUE Distal ROM, and strength Person educated: Patient Education method: Explanation and Demonstration Education comprehension: verbalized understanding, returned demonstration, and needs further education   HOME EXERCISE PROGRAM Continue with ongoing HEPs  Measurements:  12/09/2021:  Shoulder flexion: R: 120(136), L: 138(150) Shoulder abduction: R: 108(120), L: 110(120) Elbow: R: 0-148, L: 0-146 Wrist flexion: R: 55(62), L: 78 Writ extension: R: 34(55), L: 3(4) RD: R: 12(20), L: 16(26) UD: R: 8(24), L: 6(18) Thumb radial abduction: R: 11(20), L: 8(20) Digit flexion to the [facility]: R:  2nd: 5cm(3.5cm), 3rd: 7.5 cm(5cm), 4th: 7cm(3cm), 5th: 6cm(5.5cm) L: 2nd: 8cm(8cm), 3rd: 8cm(8cm), 4th: 7.5cm(7cm), 5th: 7cm(7cm)  02/03/2022:   Shoulder flexion: R: 122(138), L: 148(154) Shoulder abduction: R: 109(125), L: 125(125 Elbow: R: 0-148, L: 0-146 Wrist flexion: R: 60(68), L: 79 Writ extension: R: 40(55), L: 3(10) RD: R: 14(24), L: 20(28) UD: R: 8(24), L: 6(18) Thumb radial abduction: R: 20(25), L: 8(20) Digit flexion to the [facility]: R:  2nd: 4.5cm(3cm), 3rd: 6.5cm(4.5cm), 4th: 6.5 cm(3cm), 5th: 4.5cm(5cm) L: 2nd: 8cm(8cm), 3rd: 8cm(7cm), 4th: 7.5cm(7cm), 5th: 6.5cm(6.5cm)   04/07/2022:   Shoulder flexion: R: 125(150), L:  160(160) Shoulder abduction: R: 109(125), L: 125(125 Elbow: R: 0-148, L: 0-146 Wrist flexion: R: 60(68), L: 79 Writ extension: R: 40(55), L: 5(18) RD: R: 14(24), L: 20(28) UD: R: 18(24), L: 8(18) Thumb radial abduction: R: 20(25), L: 8(20) Digit flexion to the [facility]: R:  2nd: 4.5 cm(4 cm), 3rd: 6.5cm(3cm), 4th: 7 cm (5cm), 5th: 5cm(5cm) L: 2nd: 8cm(8cm), 3rd: 8cm(7cm), 4th: 7.5cm(7cm), 5th: 7cm(7cm)   07/05/2022:   Shoulder flexion: R: 125(154), L: 160(160) Shoulder abduction: R: 95(130), L: 143(143) Elbow: R: 0-148, L: 0-146 Wrist flexion: R: 60(68), L: 79 Writ extension: R: 40(60), L: 0(12) RD: R: 10(24), L: 12(20) UD: R: 16(24), L: 8(16) Thumb radial abduction: R: 20(25), L: 8(20) Digit flexion to the [facility]: R:  2nd: 4 cm(4cm), 3rd: 6.5cm( 3.5cm), 4th: 7 cm (5cm), 5th: 6cm(5cm) L: 2nd: 8cm(8cm), 3rd: 8cm(7cm), 4th: 8cm(7cm), 5th: 7cm(7cm)            OT Long Term Goals - 07/06/21 1106  Target Date:  09/27/2022      OT LONG TERM GOAL #1   Title Patient and caregiver will demonstrate understanding of home exercise program for ROM.    Baseline 12/09/2021: Pt. Reports the restorative ROM program has not resumed at the facility. 10/28/2021: Pt. Is attempting exercises/stretches at home. The facility restorative aides have not resumed exercises. 09/14/2021:  Pt. Reports inconsistencies with restorative ROM at  her care home. Pt/caregiver education to be provided about ongoing ROM needs.    Time 12    Period Weeks    Status Deferred     OT LONG TERM GOAL #2   Title Pt.  will improve BUE strength to be able to sustain her UE's in elevation to efficiently, and effectively perform hair care, and self-grooming tasks.   Baseline 02/03/2022: Pt. has improved, and is now able to sustain her Bilateral UEs in elevation long enough to efficiently, and effectively perform haircare, and self-grooming tasks.  12/09/2021: right shoulder 4-/5, left shoulder 4/5. Pt. has improved to be able to reach up for hair care, and self-grooming tasks. 10/28/2021: Right shoulder 3+/5, Left shoulder 4-/5 09/14/2021:  BUE shoulder strength: 3+/5 for shoulder flexion, and abduction.   Time 12    Period Weeks    Status Achieved               OT LONG TERM GOAL #3   Title Patient will demonstrate improved composite finger flexion to be able to firmly hold  and use adaptive devices during ADLs, and IADLs.   Baseline 07/05/2022: Pt. presents with digit MP, PIP, and DIP extensor tightness limiting her ability to securely grip objects in her bilateral hands.  04/07/2022: Pt. Has improved with right 2nd, and 3rd digit flexion towards the [facility]. Pt. Continues to have difficulty securely holding and applying deodorant.02/03/2022: Pt. Has improved with digit flexion, however continues to have difficulty securely holding and using deoderant. 12/09/2021: Bilateral hand/digit MP, PIP, and DIP extension tightness limits her ability achieve digit flexion to hold, and apply deodorant. 10/28/2021:  Pt. continues to have difficulty holding the deodorant. 01/12/2021: Pt. Presents with limited digit extension. Pt. Is able to initiate holding deodorant, however is unable to hold it while using it.   Time 12    Period Weeks    Status Revised   Target Date                  OT LONG TERM GOAL #4   Title Pt. Will improve bilateral wrist extension in preparation for anticipating, and initiating reaching for objects at the table.    Baseline 07/19/2022: Bilateral wrist extension in limited. 07/05/2022: Right: 40(60) Left: 0(12) 04/07/2022: Rght: 40(55) Left: 5(18) 02/03/2022: Right: 34(55) Left: 3(4) 12/09/2021: Right  34(55)  Left  3(4) 10/28/2021:  Right 22(38), Left 0(15) 09/14/2021:  Right: 17(35), left 2(15)   Time 12   Period weeks   Status Ongoing   Target Date      OT LONG TERM GOAL #5   Title Pt. will write, and sign her name with 100%  legibility, and modified independence.    Baseline 07/19/2022:  Pt. continues to be able to consistently fill out daily meal menus, and puzzles. 6/03/204: Pt. Is consistently filling out  daily meal menus, and puzzles. 04/07/2022: Pt. Reports that she continues to present with shaky appearing writing. Pt.reports that she is writing more when filling out her menu, and complete puzzles 02/03/2022: Pt. Continues to report legibility appears "shaky" when attempting signing  her name. Pt.reports that she is writing more when filling out her menu, and complete puzzles.12/09/2021: Pt. Reports legibility appears "shaky" when attempting signing her name. Pt.reports that she is writing more when filling out her menu. 10/28/2021: pt. is able to sign her name, however legibility continues to be inconsistent. Pt. Reports "shaky appearance". 09/14/2021: Pt. continues to fill out her daily menu and complete puzzles. Pt. Continues to present with limited writing legibility.   Time 12    Period Weeks    Status  Ongoing         OT LONG TERM GOAL  #6   TITLE Pt. will turn pages in a book with modified independence.    Baseline 02/03/2022: Pt. Is able to effectively turn pages of a book. 10/28/2021: Pt. Is able to consistently turn book pages. 09/14/2021: Pt. has difficulty turning pages.    Time 12    Period Weeks    Status Achieved       OT LONG TERM GOAL  #7  TITLE Pt. Will increase  bilateral lateral pinch strength by 2 lbs to be able to securely grasp items during ADLs, and IADL tasks.   Baseline 07/19/2022: TBD 07/05/2022: TBD 07/2022: NT-Pinch meter out for calibration. 02/03/2022: Right: 6#, Left: 4# 12/09/2021:  Right: 6#, Left: 4#   Time 12  Period Weeks  Status Ongoing           OT LONG TERM GOAL  #8  TITLE Pt. Will button a sweater with large 1" buttons, and zip her jacket with modified independence.   Baseline 06/07/2022: Pt. is able to use a button hook to button a sweater. Pt. Reports that she does not  wear button down clothing often.  04/07/2022: Pt. Has difficulty manipulating buttons, however was introduced to a buttonhook. Pt. Was able to hold, and use a buttonhook for buttons on a shirt. 02/03/2022: Pt. is unable to manipulate buttons on her sweater, or zippers on her jacket.  Time 12  Period Weeks  Status Achieved   OT LONG TERM GOAL  #9  TITLE Pt. will complete plant care with modified independence.  Baseline 07/19/2022: Pt. Continues to be able to water, and care for some  of her plants. Pt. Has more difficulty with plants that are harder to reach. 07/05/2022: pt. Is able to water, and care for some  of her plants. Pt. Has more difficulty with plants that are harder to reach. 04/07/2022: Pt. is now able to hold a cup and water her plants.02/03/2022: Pt. has difficulty caring for her plants.   Time 12  Period Weeks  Status Ongoing   OT LONG TERM GOAL  #10  TITLE Pt. will demonstrate adaptive techniques to assist with the efficiency of self-dressing, or morning care tasks.  Baseline 07/19/2022: Pt. continues to require assist with self-dressing/morning care tasks.   07/05/2022: Continue with goal.  04/07/2022: Pt. Continues to require assist with the efficiency of self-dressing, and morning care tasks.02/03/2022: Pt. requires assist from caregivers 2/2 time limitations during morning care.   Time 12  Period Weeks  Status Ongoing   Title Pt.  will improve BUE strength to improve ADL, and IADL functioning.  Baseline 07/19/2022:  BUE strength continues to be limited. 07/05/2022: shoulder flexion: right 4-/5, abduction: 3+/5, elbow flexion: right: 5/5, left 5/5, extension: right: 4-/5, left 4-/5, wrist extension: right: 3-/5, left: 2-/5  Time 12   Period Weeks   Status Ongoing     Plan - 07/22/21 1737  Clinical Impression Statement Pt. Reports that she continues to feel better today. Pt. Continues to  tolerate Manual therapy, there. Ex., and ROM well today. Pt. continues to present with  tightness at the bilateral thumb webspace, and bilateral wrist flexor tightness, as well as digit MP, PIP, and DIP extensor tightness which continues to limit her ability to complete ADL tasks efficiently. Pt. continues to benefit from working on impoving ROM in order to work towards increasing bilateral hand grasp on objects, and increase engagement of bilateral hands during ADLs, and IADL tasks.               OT Occupational Profile and History Detailed Assessment- Review of Records and additional review of physical, cognitive, psychosocial history related to current functional performance    Occupational performance deficits (Please refer to evaluation for details): ADL's;IADL's;Leisure;Social Participation    Body Structure / Function / Physical Skills ADL;Continence;Dexterity;Flexibility;Strength;ROM;Balance;Coordination;FMC;IADL;Endurance;UE functional use;Decreased knowledge of use of DME;[facility]    Psychosocial Skills Coping Strategies    Rehab Potential Fair    Clinical Decision Making Several treatment options, min-mod task modification necessary    Comorbidities Affecting Occupational Performance: Presence of comorbidities impacting occupational performance    Comorbidities impacting occupational performance description: contractures of bilateral hands, dependent transfers,    Modification or Assistance to Complete Evaluation  No modification of tasks or assist necessary to complete eval    OT Frequency 2x / week    OT Duration 12 weeks    OT Treatment/Interventions Self-care/ADL training;Cryotherapy;Therapeutic exercise;DME and/or AE instruction;Balance training;Neuromuscular education;Manual Therapy;Splinting;Moist Heat;Passive range of motion;Therapeutic activities;Patient/family education    Plan continue to progress ROM of digits, wrists and shoulders as it pertains to completion of ADL tasks that pt values.    Consulted and Agree with Plan of Care Patient            Olegario Messier, MS, OTR/L   08/16/22, 4:03 PM

## 2022-08-18 ENCOUNTER — Ambulatory Visit: Payer: Medicare HMO | Admitting: Occupational Therapy

## 2022-08-18 ENCOUNTER — Ambulatory Visit: Payer: Medicare HMO

## 2022-08-18 DIAGNOSIS — R278 Other lack of coordination: Secondary | ICD-10-CM

## 2022-08-18 DIAGNOSIS — M6281 Muscle weakness (generalized): Secondary | ICD-10-CM

## 2022-08-18 DIAGNOSIS — S14129S Central cord syndrome at unspecified level of cervical spinal cord, sequela: Secondary | ICD-10-CM

## 2022-08-18 DIAGNOSIS — R262 Difficulty in walking, not elsewhere classified: Secondary | ICD-10-CM

## 2022-08-18 DIAGNOSIS — R269 Unspecified abnormalities of gait and mobility: Secondary | ICD-10-CM

## 2022-08-18 NOTE — Therapy (Signed)
Occupational Therapy Neuro Treatment Note     Patient Name: Sherry [name] MRN: [id number] DOB:October 19, 1934, 88 y.o., female Today's Date: 08/18/2022                                         PCP: Dr. Tillman Abide REFERRING PROVIDER: Dr. Tillman Abide   OT End of Session - 08/18/22 1335     Visit Number [id number]    Number of Visits 96    Date for OT Re-Evaluation 09/27/22    OT Start Time 1103    OT Stop Time 1145    OT Time Calculation (min) 42 min    Equipment Utilized During Treatment moist heat    Activity Tolerance Patient tolerated treatment well    Behavior During Therapy WFL for tasks assessed/performed                  Past Medical History:  Diagnosis Date   Acute blood loss anemia    Arthritis    Cancer (HCC)    skin   Central cord syndrome at C6 level of cervical spinal cord (HCC) 11/29/2017   Hypertension    Protein-calorie malnutrition, severe (HCC) 01/24/2018   S/P insertion of IVC (inferior vena caval) filter 01/24/2018   Tetraparesis (HCC)    Past Surgical History:  Procedure Laterality Date   ANTERIOR CERVICAL DECOMP/DISCECTOMY FUSION N/A 11/29/2017   Procedure: Cervical five-six, six-seven Anterior Cervical Decompression Fusion;  Surgeon: Jadene Pierini, MD;  Location: [facility] OR;  Service: Neurosurgery;  Laterality: N/A;  Cervical five-six, six-seven Anterior Cervical Decompression Fusion   CATARACT EXTRACTION     FEMUR IM NAIL Left 08/16/2021   Procedure: INTRAMEDULLARY (IM) NAIL FEMORAL;  Surgeon: Deeann Saint, MD;  Location: [facility] ORS;  Service: Orthopedics;  Laterality: Left;   IR IVC FILTER PLMT / S&I /IMG GUID/MOD SED  12/08/2017   Patient Active Problem List   Diagnosis Date Noted   Age-related osteoporosis without current pathological fracture 07/27/2022   Mild recurrent major depression (HCC) 07/27/2022   Hypertensive heart disease with other congestive heart failure (HCC) 02/14/2022   Chronic indwelling Foley catheter 02/14/2022   Closed  hip fracture (HCC) 08/15/2021   DVT (deep venous thrombosis) (HCC) 08/15/2021   Quadriplegia (HCC) 08/15/2021   Fall 08/15/2021   Constipation due to slow transit 08/31/2018   Trauma 06/05/2018   Neuropathic pain 06/05/2018   Neurogenic bowel 06/05/2018   Vaginal yeast infection 01/30/2018   Healthcare-associated pneumonia 01/25/2018   Chronic allergic rhinitis 01/24/2018   Depression with anxiety 01/24/2018   UTI due to Klebsiella species 01/24/2018   Protein-calorie malnutrition, severe (HCC) 01/24/2018   S/P insertion of IVC (inferior vena caval) filter 01/24/2018   Tetraparesis (HCC) 01/20/2018   Neurogenic bladder 01/20/2018   Reactive depression    Benign essential HTN    Acute postoperative anemia due to expected blood loss    Central cord syndrome at C6 level of cervical spinal cord (HCC) 11/29/2017   Allergy to alpha-gal 11/25/2016   SCC (squamous cell carcinoma) 04/08/2014    REFERRING DIAG: Central Cord Syndrome at C6 of the Cervical Spinal Cord, Fall with Bilateral Closed Hip Fractures, with ORIF repair with Intramedullary Nailing of the Left Hip Fracture.  THERAPY DIAG:  Muscle weakness (generalized)  Rationale for Evaluation and Treatment Rehabilitation  PERTINENT HISTORY: Patient s/p fall November 29, 2017 resulting in diagnosis of central cord syndrome  at C 6 level.  She has had therapy in multiple venues but with recent move to [facility], therapy staff recommended she seek outpatient therapy for her needs. Pt. Had a recent fall with Bilateral Closed Hip Fractures, with ORIF repair with Intramedullary Nailing of the Left Hip Fracture.   PRECAUTIONS: None  SUBJECTIVE:   Pt. reports feeling good today.  PAIN:  Are you having pain? No  OBJECTIVE:    Today's Treatment:   Manual Therapy:   Pt. tolerated soft tissue massage to the volar, and dorsal surface of each digit on the bilateral hands, and thenar eminence secondary to stiffness following moist heat  modality. Soft tissue mobilization was performed for Carpal, and metacarpal spread stretches. Manual therapy was performed independent of, and in preparation for therapeutic Ex.   Therapeutic Ex.    Pt. tolerated PROM followed by AROM bilateral wrist extension, PROM for bilateral digit MP, PIP, and DIP flexion, and extension, thumb radial, and palmar abduction. Emphasis was placed on left wrist extension with moist heat modality 2/2 tightness. Pt. worked on RUE shoulder flexion using a medium incline wedge followed by a steeper incline wedge,. Pt. Was able to perform the task with a 2# cuff weight in place on the right wrist. Pt. worked on the BUE for strengthening, and reciprocal motion using the UBE while seated for 8 min. with no resistance. Constant monitoring was provided.     PATIENT EDUCATION: RUE Distal ROM, and strength Person educated: Patient Education method: Explanation and Demonstration Education comprehension: verbalized understanding, returned demonstration, and needs further education   HOME EXERCISE PROGRAM Continue with ongoing HEPs  Measurements:  12/09/2021:  Shoulder flexion: R: 120(136), L: 138(150) Shoulder abduction: R: 108(120), L: 110(120) Elbow: R: 0-148, L: 0-146 Wrist flexion: R: 55(62), L: 78 Writ extension: R: 34(55), L: 3(4) RD: R: 12(20), L: 16(26) UD: R: 8(24), L: 6(18) Thumb radial abduction: R: 11(20), L: 8(20) Digit flexion to the [facility]: R:  2nd: 5cm(3.5cm), 3rd: 7.5 cm(5cm), 4th: 7cm(3cm), 5th: 6cm(5.5cm) L: 2nd: 8cm(8cm), 3rd: 8cm(8cm), 4th: 7.5cm(7cm), 5th: 7cm(7cm)  02/03/2022:   Shoulder flexion: R: 122(138), L: 148(154) Shoulder abduction: R: 109(125), L: 125(125 Elbow: R: 0-148, L: 0-146 Wrist flexion: R: 60(68), L: 79 Writ extension: R: 40(55), L: 3(10) RD: R: 14(24), L: 20(28) UD: R: 8(24), L: 6(18) Thumb radial abduction: R: 20(25), L: 8(20) Digit flexion to the [facility]: R:  2nd: 4.5cm(3cm), 3rd: 6.5cm(4.5cm), 4th: 6.5 cm(3cm), 5th:  4.5cm(5cm) L: 2nd: 8cm(8cm), 3rd: 8cm(7cm), 4th: 7.5cm(7cm), 5th: 6.5cm(6.5cm)   04/07/2022:   Shoulder flexion: R: 125(150), L: 160(160) Shoulder abduction: R: 109(125), L: 125(125 Elbow: R: 0-148, L: 0-146 Wrist flexion: R: 60(68), L: 79 Writ extension: R: 40(55), L: 5(18) RD: R: 14(24), L: 20(28) UD: R: 18(24), L: 8(18) Thumb radial abduction: R: 20(25), L: 8(20) Digit flexion to the [facility]: R:  2nd: 4.5 cm(4 cm), 3rd: 6.5cm(3cm), 4th: 7 cm (5cm), 5th: 5cm(5cm) L: 2nd: 8cm(8cm), 3rd: 8cm(7cm), 4th: 7.5cm(7cm), 5th: 7cm(7cm)   07/05/2022:   Shoulder flexion: R: 125(154), L: 160(160) Shoulder abduction: R: 95(130), L: 143(143) Elbow: R: 0-148, L: 0-146 Wrist flexion: R: 60(68), L: 79 Writ extension: R: 40(60), L: 0(12) RD: R: 10(24), L: 12(20) UD: R: 16(24), L: 8(16) Thumb radial abduction: R: 20(25), L: 8(20) Digit flexion to the [facility]: R:  2nd: 4 cm(4cm), 3rd: 6.5cm( 3.5cm), 4th: 7 cm (5cm), 5th: 6cm(5cm) L: 2nd: 8cm(8cm), 3rd: 8cm(7cm), 4th: 8cm(7cm), 5th: 7cm(7cm)  OT Long Term Goals - 07/06/21 1106                                                                                          Target Date:  09/27/2022      OT LONG TERM GOAL #1   Title Patient and caregiver will demonstrate understanding of home exercise program for ROM.    Baseline 12/09/2021: Pt. Reports the restorative ROM program has not resumed at the facility. 10/28/2021: Pt. Is attempting exercises/stretches at home. The facility restorative aides have not resumed exercises. 09/14/2021:  Pt. Reports inconsistencies with restorative ROM at  her care home. Pt/caregiver education to be provided about ongoing ROM needs.    Time 12    Period Weeks    Status Deferred     OT LONG TERM GOAL #2   Title Pt.  will improve BUE strength to be able to sustain her UE's in elevation to efficiently, and effectively perform hair care, and self-grooming tasks.   Baseline 02/03/2022: Pt. has improved, and is now  able to sustain her Bilateral UEs in elevation long enough to efficiently, and effectively perform haircare, and self-grooming tasks. 12/09/2021: right shoulder 4-/5, left shoulder 4/5. Pt. has improved to be able to reach up for hair care, and self-grooming tasks. 10/28/2021: Right shoulder 3+/5, Left shoulder 4-/5 09/14/2021:  BUE shoulder strength: 3+/5 for shoulder flexion, and abduction.   Time 12    Period Weeks    Status Achieved               OT LONG TERM GOAL #3   Title Patient will demonstrate improved composite finger flexion to be able to firmly hold  and use adaptive devices during ADLs, and IADLs.   Baseline 07/05/2022: Pt. presents with digit MP, PIP, and DIP extensor tightness limiting her ability to securely grip objects in her bilateral hands.  04/07/2022: Pt. Has improved with right 2nd, and 3rd digit flexion towards the [facility]. Pt. Continues to have difficulty securely holding and applying deodorant.02/03/2022: Pt. Has improved with digit flexion, however continues to have difficulty securely holding and using deoderant. 12/09/2021: Bilateral hand/digit MP, PIP, and DIP extension tightness limits her ability achieve digit flexion to hold, and apply deodorant. 10/28/2021:  Pt. continues to have difficulty holding the deodorant. 01/12/2021: Pt. Presents with limited digit extension. Pt. Is able to initiate holding deodorant, however is unable to hold it while using it.   Time 12    Period Weeks    Status Revised   Target Date                  OT LONG TERM GOAL #4   Title Pt. Will improve bilateral wrist extension in preparation for anticipating, and initiating reaching for objects at the table.    Baseline 07/19/2022: Bilateral wrist extension in limited. 07/05/2022: Right: 40(60) Left: 0(12) 04/07/2022: Rght: 40(55) Left: 5(18) 02/03/2022: Right: 34(55) Left: 3(4) 12/09/2021: Right  34(55)  Left  3(4) 10/28/2021:  Right 22(38), Left 0(15) 09/14/2021:  Right: 17(35), left 2(15)   Time 12    Period weeks   Status Ongoing   Target  Date      OT LONG TERM GOAL #5   Title Pt. will write, and sign her name with 100% legibility, and modified independence.    Baseline 07/19/2022:  Pt. continues to be able to consistently fill out daily meal menus, and puzzles. 6/03/204: Pt. Is consistently filling out  daily meal menus, and puzzles. 04/07/2022: Pt. Reports that she continues to present with shaky appearing writing. Pt.reports that she is writing more when filling out her menu, and complete puzzles 02/03/2022: Pt. Continues to report legibility appears "shaky" when attempting signing her name. Pt.reports that she is writing more when filling out her menu, and complete puzzles.12/09/2021: Pt. Reports legibility appears "shaky" when attempting signing her name. Pt.reports that she is writing more when filling out her menu. 10/28/2021: pt. is able to sign her name, however legibility continues to be inconsistent. Pt. Reports "shaky appearance". 09/14/2021: Pt. continues to fill out her daily menu and complete puzzles. Pt. Continues to present with limited writing legibility.   Time 12    Period Weeks    Status  Ongoing         OT LONG TERM GOAL  #6   TITLE Pt. will turn pages in a book with modified independence.    Baseline 02/03/2022: Pt. Is able to effectively turn pages of a book. 10/28/2021: Pt. Is able to consistently turn book pages. 09/14/2021: Pt. has difficulty turning pages.    Time 12    Period Weeks    Status Achieved       OT LONG TERM GOAL  #7  TITLE Pt. Will increase  bilateral lateral pinch strength by 2 lbs to be able to securely grasp items during ADLs, and IADL tasks.   Baseline 07/19/2022: TBD 07/05/2022: TBD 07/2022: NT-Pinch meter out for calibration. 02/03/2022: Right: 6#, Left: 4# 12/09/2021:  Right: 6#, Left: 4#   Time 12  Period Weeks  Status Ongoing           OT LONG TERM GOAL  #8  TITLE Pt. Will button a sweater with large 1" buttons, and zip her jacket with  modified independence.   Baseline 06/07/2022: Pt. is able to use a button hook to button a sweater. Pt. Reports that she does not wear button down clothing often.  04/07/2022: Pt. Has difficulty manipulating buttons, however was introduced to a buttonhook. Pt. Was able to hold, and use a buttonhook for buttons on a shirt. 02/03/2022: Pt. is unable to manipulate buttons on her sweater, or zippers on her jacket.  Time 12  Period Weeks  Status Achieved   OT LONG TERM GOAL  #9  TITLE Pt. will complete plant care with modified independence.  Baseline 07/19/2022: Pt. Continues to be able to water, and care for some  of her plants. Pt. Has more difficulty with plants that are harder to reach. 07/05/2022: pt. Is able to water, and care for some  of her plants. Pt. Has more difficulty with plants that are harder to reach. 04/07/2022: Pt. is now able to hold a cup and water her plants.02/03/2022: Pt. has difficulty caring for her plants.   Time 12  Period Weeks  Status Ongoing   OT LONG TERM GOAL  #10  TITLE Pt. will demonstrate adaptive techniques to assist with the efficiency of self-dressing, or morning care tasks.  Baseline 07/19/2022: Pt. continues to require assist with self-dressing/morning care tasks.   07/05/2022: Continue with goal.  04/07/2022: Pt. Continues to require assist with the efficiency of self-dressing,  and morning care tasks.02/03/2022: Pt. requires assist from caregivers 2/2 time limitations during morning care.   Time 12  Period Weeks  Status Ongoing   Title Pt.  will improve BUE strength to improve ADL, and IADL functioning.  Baseline 07/19/2022:  BUE strength continues to be limited. 07/05/2022: shoulder flexion: right 4-/5, abduction: 3+/5, elbow flexion: right: 5/5, left 5/5, extension: right: 4-/5, left 4-/5, wrist extension: right: 3-/5, left: 2-/5  Time 12   Period Weeks   Status Ongoing     Plan - 07/22/21 1737     Clinical Impression Statement Pt. continues to  tolerate Manual  therapy, there. Ex., and ROM well today. Pt. presented with difficulty performing the dowel exercise with the palms up.   Pt. continues to present with tightness at the bilateral thumb webspace, and bilateral wrist flexor tightness, as well as digit MP, PIP, and DIP extensor tightness which continues to limit her ability to complete ADL tasks efficiently. Pt. continues to benefit from working on impoving ROM in order to work towards increasing bilateral hand grasp on objects, and increase engagement of bilateral hands during ADLs, and IADL tasks.               OT Occupational Profile and History Detailed Assessment- Review of Records and additional review of physical, cognitive, psychosocial history related to current functional performance    Occupational performance deficits (Please refer to evaluation for details): ADL's;IADL's;Leisure;Social Participation    Body Structure / Function / Physical Skills ADL;Continence;Dexterity;Flexibility;Strength;ROM;Balance;Coordination;FMC;IADL;Endurance;UE functional use;Decreased knowledge of use of DME;[facility]    Psychosocial Skills Coping Strategies    Rehab Potential Fair    Clinical Decision Making Several treatment options, min-mod task modification necessary    Comorbidities Affecting Occupational Performance: Presence of comorbidities impacting occupational performance    Comorbidities impacting occupational performance description: contractures of bilateral hands, dependent transfers,    Modification or Assistance to Complete Evaluation  No modification of tasks or assist necessary to complete eval    OT Frequency 2x / week    OT Duration 12 weeks    OT Treatment/Interventions Self-care/ADL training;Cryotherapy;Therapeutic exercise;DME and/or AE instruction;Balance training;Neuromuscular education;Manual Therapy;Splinting;Moist Heat;Passive range of motion;Therapeutic activities;Patient/family education    Plan continue to progress ROM of digits, wrists  and shoulders as it pertains to completion of ADL tasks that pt values.    Consulted and Agree with Plan of Care Patient            Olegario Messier, MS, OTR/L   08/18/22, 1:39 PM

## 2022-08-18 NOTE — Therapy (Signed)
OUTPATIENT PHYSICAL THERAPY NEURO TREATMENT   Patient Name: Sherry [name] MRN: [id number] DOB:1934-09-09, 88 y.o., female Today's Date: 08/18/2022   PCP: Earnestine Mealing, MD REFERRING PROVIDER: Deeann Saint   PT End of Session - 08/18/22 1257     Visit Number 60    Number of Visits 74    Date for PT Re-Evaluation 09/27/22    PT Start Time 1148    PT Stop Time 1228    PT Time Calculation (min) 40 min    Equipment Utilized During Treatment Gait belt    Activity Tolerance Patient tolerated treatment well;Patient limited by fatigue;No increased pain    Behavior During Therapy WFL for tasks assessed/performed             Past Medical History:  Diagnosis Date   Acute blood loss anemia    Arthritis    Cancer (HCC)    skin   Central cord syndrome at C6 level of cervical spinal cord (HCC) 11/29/2017   Hypertension    Protein-calorie malnutrition, severe (HCC) 01/24/2018   S/P insertion of IVC (inferior vena caval) filter 01/24/2018   Tetraparesis (HCC)    Past Surgical History:  Procedure Laterality Date   ANTERIOR CERVICAL DECOMP/DISCECTOMY FUSION N/A 11/29/2017   Procedure: Cervical five-six, six-seven Anterior Cervical Decompression Fusion;  Surgeon: Jadene Pierini, MD;  Location: [facility];  Service: Neurosurgery;  Laterality: N/A;  Cervical five-six, six-seven Anterior Cervical Decompression Fusion   CATARACT EXTRACTION     FEMUR IM NAIL Left 08/16/2021   Procedure: INTRAMEDULLARY (IM) NAIL FEMORAL;  Surgeon: Deeann Saint, MD;  Location: [facility] ORS;  Service: Orthopedics;  Laterality: Left;   IR IVC FILTER PLMT / S&I /IMG GUID/MOD SED  12/08/2017   Patient Active Problem List   Diagnosis Date Noted   Age-related osteoporosis without current pathological fracture 07/27/2022   Mild recurrent major depression (HCC) 07/27/2022   Hypertensive heart disease with other congestive heart failure (HCC) 02/14/2022   Chronic indwelling Foley catheter 02/14/2022   Closed  hip fracture (HCC) 08/15/2021   DVT (deep venous thrombosis) (HCC) 08/15/2021   Quadriplegia (HCC) 08/15/2021   Fall 08/15/2021   Constipation due to slow transit 08/31/2018   Trauma 06/05/2018   Neuropathic pain 06/05/2018   Neurogenic bowel 06/05/2018   Vaginal yeast infection 01/30/2018   Healthcare-associated pneumonia 01/25/2018   Chronic allergic rhinitis 01/24/2018   Depression with anxiety 01/24/2018   UTI due to Klebsiella species 01/24/2018   Protein-calorie malnutrition, severe (HCC) 01/24/2018   S/P insertion of IVC (inferior vena caval) filter 01/24/2018   Tetraparesis (HCC) 01/20/2018   Neurogenic bladder 01/20/2018   Reactive depression    Benign essential HTN    Acute postoperative anemia due to expected blood loss    Central cord syndrome at C6 level of cervical spinal cord (HCC) 11/29/2017   Allergy to alpha-gal 11/25/2016   SCC (squamous cell carcinoma) 04/08/2014    ONSET DATE: 08/15/2021 (fall with B hip fx); Initial injury was in 2019- quadraplegia due to central cord syndrome of C6 secondary to neck fracture.   REFERRING DIAG: Bilateral Hip fx; Left Open- s/p Left IM nail femoral on 08/16/2021; Right closed  THERAPY DIAG:  Muscle weakness (generalized)  Other lack of coordination  Central cord syndrome, sequela (HCC)  Abnormality of gait and mobility  Difficulty in walking, not elsewhere classified  Rationale for Evaluation and Treatment Rehabilitation  SUBJECTIVE:   SUBJECTIVE STATEMENT:  Patient reports no changes since last visit. Was tired thereafter, but did  not      Pt accompanied by:  Paid caregiver-   PERTINENT HISTORY: Sherry [name] is an 87yoF who experienced a fall at home on 08/15/2021 with hip fracture, s/p Left hip ORIF. PMH: quadriplegia due to central cord syndrome of C6 secondary to neck fracture, HTN, depression with anxiety, neurogenic bladder, bilateral DVT on Xarelto (s/p of IVC filter placement). Prior history of  significant PT/OT with improvement of function. Pt has full use of shoulder/elbows, limited use of hands, adaptive self feeding sucessful.     PAIN:  None   OBJECTIVE:    TODAY'S TREATMENT: -seated ankle DF 3x30sec bilat, mobilization with movement on left ankle DF x30sec  -bilat hip flexions stretch 2x60secH bilat -bilat knee flexion stretch 2x30sec bilat   -short sitting, chair back reclined situp to end range flexion posture, then giant yellow TB loop resisted trunk extension to chair back 2x20  -manually assisted hip/knee flexion to manually resisted hip extension (support at distal thigh to isolate hip extension and remove knee extensor moment 2x12 bilat   Pt educated throughout session about proper posture and technique with exercises. Improved exercise technique, movement at target joints, use of target muscles after min to mod verbal, visual, tactile cues.   PATIENT EDUCATION: Education details: PT plan of care/use of resistive bands Person educated: Patient Education method: Explanation, Demonstration, Tactile cues, and Verbal cues Education comprehension: verbalized understanding, returned demonstration, verbal cues required, tactile cues required, and needs further education   HOME EXERCISE PROGRAM: No changes at this time  Access Code: [facility] URL: [email or web address] Date: 11/23/2021 Prepared by: Precious Bard Exercises - Seated Gluteal Sets - 2 x daily - 7 x weekly - 2 sets - 10 reps - 5 hold - Seated Quad Set - 2 x daily - 7 x weekly - 2 sets - 10 reps - 5 hold - Seated Long Arc Quad - 2 x daily - 7 x weekly - 2 sets - 10 reps - 5 hold - Seated March - 2 x daily - 7 x weekly - 2 sets - 10 reps - 5 hold - Seated Hip Abduction - 2 x daily - 7 x weekly - 2 sets - 10 reps - 5 hold - Seated Shoulder Shrugs - 2 x daily - 7 x weekly - 2 sets - 10 reps - 5 hold - Wheelchair Pressure Relief - 2 x daily - 7 x weekly - 2 sets - 10 reps - 5 hold  GOALS: Goals  reviewed with patient? Yes  SHORT TERM GOALS: Target date: 02/22/2022  Pt will be independent with initial UE strengthening HEP in order to improve strength and balance in order to decrease fall risk and improve function at home and work. Baseline: 10/19/2021- patient with no formal UE HEP; 12/02/2021= Patient verbalized knowledge of HEP including use of theraband for UE strengthening.  Goal status: GOAL MET  LONG TERM GOALS: Target date: 09/27/2022  Pt will be independent with final for UE/LE HEP in order to improve strength and balance in order to decrease fall risk and improve function at home and work. Baseline: Patient is currently BLE NWB and unable to participate in HEP. Has order for UE strengthening. 01/11/2022- Patient now able to participate in LE strengthening although NWB still- good understanding for some basic exercises. Will keep goal active to incorporate progressive LE strengthening exercises. 04/07/2022= Patient still participating in progressive LE seated HEP and has no questions at this time.  Goal status: MET  2.  Pt will  improve FOTO to target score of 35  to display perceived improvements in ability to complete ADL's.  Baseline: 10/19/2021= 12; 12/02/2021= 17; 01/11/2022=17; 04/07/2022= 17; 07/05/2022=17 Goal status: Ongoing  3.  Pt will increase strength of B UE  by at least 1/2 MMT grade in order to demonstrate improvement in strength and function  Baseline: patient has range of 2-/5 to 4/5 BUE Strength; 12/02/2021= 4/5 except with wrist ext. 01/11/2022= 4/5 B UE strength expept for wrist Goal status: Goal revised-no longer appropriate- working with OT on all UE strengthening.   4. Pt. Will increase strength of RLE by at least 1/2 MMT grade in order to demonstrate improvement in Standing/transfers.  Baseline: 2-/5 Right hip flex/knee ext/flex; 04/07/2022= 2- with right hip flex/knee ext (lacking 28 deg from zero) 4/8= Patient able to ext right knee lacking 18 deg from zero.  07/05/2022=Left knee approx 8 deg from zero and right knee = 23 deg from zero  Goal status: Progressing  5. Pt. Will demo ability to stand pivot transfer with max assist for improved functional mobility and less dependent need on mechanical device.   Baseline: Dependent on hoyer lift for all transfers; 04/07/2022- Unable to test secondary to patient with recent UTI and right hand procedure - will attempt to reassess next visit. ; 04/12/22: Unable to successfully stand due to feet plates on loaned power w/c; 05/10/2022- Patient still having to use rental chair and has not received her original power w/c back to practice SPT. 07/04/2021= Patient just received new power w/c and attempted standing from 1st time today- Patient able to stand with max assist today from w/c and did not attempt to perform pivot transfer- difficulty with static standing and placing weight on right LE.   Goal status: ONGOING  6. Pt. Will demonstrate improved functional LE strength as seen by ability to stand > 2 min for improved transfer ability and pregait abilities.   Baseline: not assessed today due to recent dx: UTI; 04/07/2022- Unable to test secondary to patient with recent UTI and right hand procedure - will attempt to reassess next visit; 04/12/22: Unable to successfully stand due to feet plates on loaned power w/c 05/10/2022- Patient still having to use rental chair and has not received her original power w/c back to practice Standing. 07/05/2022- Patient able to stand with max assist at trunk today for 2 min 3 sec. Will keep goal active to ensure consistency.   Goal status: ONGOING  ASSESSMENT:  CLINICAL IMPRESSION: Continued with focal lengthening, strengthening, and core strength. Pt shows good control of trunk and endurance, able to advance to more advanced activity this date. Attempted to incorporate cable tower, but powerchair/knee joint limitations did not allow for optimal alignment- also resisted extension requires more active  hamstrings strength which is not available at present. Pt will continue to benefit from skilled PT services to optimize independence and reduced caregiver support.    OBJECTIVE IMPAIRMENTS Abnormal gait, decreased activity tolerance, decreased balance, decreased coordination, decreased endurance, decreased mobility, difficulty walking, decreased ROM, decreased strength, hypomobility, impaired flexibility, impaired UE functional use, postural dysfunction, and pain.   ACTIVITY LIMITATIONS carrying, lifting, bending, sitting, standing, squatting, sleeping, stairs, transfers, bed mobility, continence, bathing, toileting, dressing, self feeding, reach over head, hygiene/grooming, and caring for others  PARTICIPATION LIMITATIONS: meal prep, cleaning, laundry, medication management, personal finances, interpersonal relationship, driving, shopping, community activity, and yard work  PERSONAL FACTORS Age, Time since onset of injury/illness/exacerbation, and 1-2 comorbidities: HTN, cervical Sx  are also affecting  patient's functional outcome.   REHAB POTENTIAL: Good  CLINICAL DECISION MAKING: Evolving/moderate complexity  EVALUATION COMPLEXITY: Moderate  PLAN: PT FREQUENCY: 1-2x/week  PT DURATION: 12 weeks  PLANNED INTERVENTIONS: Therapeutic exercises, Therapeutic activity, Neuromuscular re-education, Balance training, Gait training, Patient/Family education, Self Care, Joint mobilization, DME instructions, Dry Needling, Electrical stimulation, Wheelchair mobility training, Spinal mobilization, Cryotherapy, Moist heat, and Manual therapy  PLAN FOR NEXT SESSION: Continue with weight bearing activities,  practice standing next visit back in // bars and LE strengthening as appropriate.   1:07 PM, 08/18/22 Rosamaria Lints, PT, DPT Physical Therapist - [facility]  Outpatient Physical Therapy- Main Campus [phone]     08/18/2022, 1:07 PM

## 2022-08-19 ENCOUNTER — Ambulatory Visit: Payer: Medicare HMO | Admitting: Occupational Therapy

## 2022-08-23 ENCOUNTER — Ambulatory Visit: Payer: Medicare HMO

## 2022-08-23 ENCOUNTER — Ambulatory Visit: Payer: Medicare HMO | Admitting: Occupational Therapy

## 2022-08-23 DIAGNOSIS — S14129S Central cord syndrome at unspecified level of cervical spinal cord, sequela: Secondary | ICD-10-CM

## 2022-08-23 DIAGNOSIS — M6281 Muscle weakness (generalized): Secondary | ICD-10-CM | POA: Diagnosis not present

## 2022-08-23 DIAGNOSIS — R269 Unspecified abnormalities of gait and mobility: Secondary | ICD-10-CM

## 2022-08-23 DIAGNOSIS — S72001A Fracture of unspecified part of neck of right femur, initial encounter for closed fracture: Secondary | ICD-10-CM

## 2022-08-23 DIAGNOSIS — R278 Other lack of coordination: Secondary | ICD-10-CM

## 2022-08-23 DIAGNOSIS — R262 Difficulty in walking, not elsewhere classified: Secondary | ICD-10-CM

## 2022-08-23 NOTE — Therapy (Signed)
OUTPATIENT PHYSICAL THERAPY NEURO TREATMENT   Patient Name: Sherry [name] MRN: [id number] DOB:1934/06/04, 88 y.o., female Today's Date: 08/24/2022   PCP: Earnestine Mealing, MD REFERRING PROVIDER: Deeann Saint   PT End of Session - 08/23/22 1147     Visit Number 61    Number of Visits 74    Date for PT Re-Evaluation 09/27/22    Progress Note Due on Visit 70    PT Start Time 1146    PT Stop Time 1227    PT Time Calculation (min) 41 min    Equipment Utilized During Treatment Gait belt    Activity Tolerance Patient tolerated treatment well;Patient limited by fatigue;No increased pain    Behavior During Therapy WFL for tasks assessed/performed              Past Medical History:  Diagnosis Date   Acute blood loss anemia    Arthritis    Cancer (HCC)    skin   Central cord syndrome at C6 level of cervical spinal cord (HCC) 11/29/2017   Hypertension    Protein-calorie malnutrition, severe (HCC) 01/24/2018   S/P insertion of IVC (inferior vena caval) filter 01/24/2018   Tetraparesis (HCC)    Past Surgical History:  Procedure Laterality Date   ANTERIOR CERVICAL DECOMP/DISCECTOMY FUSION N/A 11/29/2017   Procedure: Cervical five-six, six-seven Anterior Cervical Decompression Fusion;  Surgeon: Jadene Pierini, MD;  Location: [facility] OR;  Service: Neurosurgery;  Laterality: N/A;  Cervical five-six, six-seven Anterior Cervical Decompression Fusion   CATARACT EXTRACTION     FEMUR IM NAIL Left 08/16/2021   Procedure: INTRAMEDULLARY (IM) NAIL FEMORAL;  Surgeon: Deeann Saint, MD;  Location: [facility] ORS;  Service: Orthopedics;  Laterality: Left;   IR IVC FILTER PLMT / S&I /IMG GUID/MOD SED  12/08/2017   Patient Active Problem List   Diagnosis Date Noted   Age-related osteoporosis without current pathological fracture 07/27/2022   Mild recurrent major depression (HCC) 07/27/2022   Hypertensive heart disease with other congestive heart failure (HCC) 02/14/2022   Chronic indwelling  Foley catheter 02/14/2022   Closed hip fracture (HCC) 08/15/2021   DVT (deep venous thrombosis) (HCC) 08/15/2021   Quadriplegia (HCC) 08/15/2021   Fall 08/15/2021   Constipation due to slow transit 08/31/2018   Trauma 06/05/2018   Neuropathic pain 06/05/2018   Neurogenic bowel 06/05/2018   Vaginal yeast infection 01/30/2018   Healthcare-associated pneumonia 01/25/2018   Chronic allergic rhinitis 01/24/2018   Depression with anxiety 01/24/2018   UTI due to Klebsiella species 01/24/2018   Protein-calorie malnutrition, severe (HCC) 01/24/2018   S/P insertion of IVC (inferior vena caval) filter 01/24/2018   Tetraparesis (HCC) 01/20/2018   Neurogenic bladder 01/20/2018   Reactive depression    Benign essential HTN    Acute postoperative anemia due to expected blood loss    Central cord syndrome at C6 level of cervical spinal cord (HCC) 11/29/2017   Allergy to alpha-gal 11/25/2016   SCC (squamous cell carcinoma) 04/08/2014    ONSET DATE: 08/15/2021 (fall with B hip fx); Initial injury was in 2019- quadraplegia due to central cord syndrome of C6 secondary to neck fracture.   REFERRING DIAG: Bilateral Hip fx; Left Open- s/p Left IM nail femoral on 08/16/2021; Right closed  THERAPY DIAG:  Muscle weakness (generalized)  Other lack of coordination  Central cord syndrome, sequela (HCC)  Abnormality of gait and mobility  Difficulty in walking, not elsewhere classified  Closed fracture of both hips, initial encounter [facility])  Rationale for Evaluation and Treatment Rehabilitation  SUBJECTIVE:   SUBJECTIVE STATEMENT:  Patient reports had an okay weekend with no new issues.     Pt accompanied by:  Paid caregiver-   PERTINENT HISTORY: Sherry [name] is an 87yoF who experienced a fall at home on 08/15/2021 with hip fracture, s/p Left hip ORIF. PMH: quadriplegia due to central cord syndrome of C6 secondary to neck fracture, HTN, depression with anxiety, neurogenic bladder, bilateral  DVT on Xarelto (s/p of IVC filter placement). Prior history of significant PT/OT with improvement of function. Pt has full use of shoulder/elbows, limited use of hands, adaptive self feeding sucessful.     PAIN:  None   OBJECTIVE:    TODAY'S TREATMENT:  THEREX:  -seated ankle DF x 20 reps each LE   Pyramid strengthening-  Hip march- AAROM 12 reps each LE, 10 reps AROM (foot to clear foot plate), 8 reps with 1lb each LE Hip abd (PT extended knees to keep straight) - 12 reps AROM; 10 reps 1 lb; 8 reps with 1.5 lb each LE Knee ext- (AAROM on right x 15; AROM LLE x 15), (AROM on RLE x 12; 1.5 lb x 12 LLE), (1lb on RLE x 10 reps; 3lb x10 reps LLE), (1.5 lb RLE x 8 reps; 5# x 8 reps -LLE)  -chair back reclined - Manual Leg press- 3 sets with progressive increase in manual resistance- 12 reps, 10 reps, 8 reps, 6 reps each LE  Pt educated throughout session about proper posture and technique with exercises. Improved exercise technique, movement at target joints, use of target muscles after min to mod verbal, visual, tactile cues.   PATIENT EDUCATION: Education details: PT plan of care/use of resistive bands Person educated: Patient Education method: Explanation, Demonstration, Tactile cues, and Verbal cues Education comprehension: verbalized understanding, returned demonstration, verbal cues required, tactile cues required, and needs further education   HOME EXERCISE PROGRAM: No changes at this time  Access Code: [facility] URL: [email or web address] Date: 11/23/2021 Prepared by: Precious Bard Exercises - Seated Gluteal Sets - 2 x daily - 7 x weekly - 2 sets - 10 reps - 5 hold - Seated Quad Set - 2 x daily - 7 x weekly - 2 sets - 10 reps - 5 hold - Seated Long Arc Quad - 2 x daily - 7 x weekly - 2 sets - 10 reps - 5 hold - Seated March - 2 x daily - 7 x weekly - 2 sets - 10 reps - 5 hold - Seated Hip Abduction - 2 x daily - 7 x weekly - 2 sets - 10 reps - 5 hold - Seated Shoulder  Shrugs - 2 x daily - 7 x weekly - 2 sets - 10 reps - 5 hold - Wheelchair Pressure Relief - 2 x daily - 7 x weekly - 2 sets - 10 reps - 5 hold  GOALS: Goals reviewed with patient? Yes  SHORT TERM GOALS: Target date: 02/22/2022  Pt will be independent with initial UE strengthening HEP in order to improve strength and balance in order to decrease fall risk and improve function at home and work. Baseline: 10/19/2021- patient with no formal UE HEP; 12/02/2021= Patient verbalized knowledge of HEP including use of theraband for UE strengthening.  Goal status: GOAL MET  LONG TERM GOALS: Target date: 09/27/2022  Pt will be independent with final for UE/LE HEP in order to improve strength and balance in order to decrease fall risk and improve function at home and work. Baseline: Patient is currently BLE  NWB and unable to participate in HEP. Has order for UE strengthening. 01/11/2022- Patient now able to participate in LE strengthening although NWB still- good understanding for some basic exercises. Will keep goal active to incorporate progressive LE strengthening exercises. 04/07/2022= Patient still participating in progressive LE seated HEP and has no questions at this time.  Goal status: MET  2.  Pt will improve FOTO to target score of 35  to display perceived improvements in ability to complete ADL's.  Baseline: 10/19/2021= 12; 12/02/2021= 17; 01/11/2022=17; 04/07/2022= 17; 07/05/2022=17 Goal status: Ongoing  3.  Pt will increase strength of B UE  by at least 1/2 MMT grade in order to demonstrate improvement in strength and function  Baseline: patient has range of 2-/5 to 4/5 BUE Strength; 12/02/2021= 4/5 except with wrist ext. 01/11/2022= 4/5 B UE strength expept for wrist Goal status: Goal revised-no longer appropriate- working with OT on all UE strengthening.   4. Pt. Will increase strength of RLE by at least 1/2 MMT grade in order to demonstrate improvement in Standing/transfers.  Baseline: 2-/5 Right hip  flex/knee ext/flex; 04/07/2022= 2- with right hip flex/knee ext (lacking 28 deg from zero) 4/8= Patient able to ext right knee lacking 18 deg from zero. 07/05/2022=Left knee approx 8 deg from zero and right knee = 23 deg from zero  Goal status: Progressing  5. Pt. Will demo ability to stand pivot transfer with max assist for improved functional mobility and less dependent need on mechanical device.   Baseline: Dependent on hoyer lift for all transfers; 04/07/2022- Unable to test secondary to patient with recent UTI and right hand procedure - will attempt to reassess next visit. ; 04/12/22: Unable to successfully stand due to feet plates on loaned power w/c; 05/10/2022- Patient still having to use rental chair and has not received her original power w/c back to practice SPT. 07/04/2021= Patient just received new power w/c and attempted standing from 1st time today- Patient able to stand with max assist today from w/c and did not attempt to perform pivot transfer- difficulty with static standing and placing weight on right LE.   Goal status: ONGOING  6. Pt. Will demonstrate improved functional LE strength as seen by ability to stand > 2 min for improved transfer ability and pregait abilities.   Baseline: not assessed today due to recent dx: UTI; 04/07/2022- Unable to test secondary to patient with recent UTI and right hand procedure - will attempt to reassess next visit; 04/12/22: Unable to successfully stand due to feet plates on loaned power w/c 05/10/2022- Patient still having to use rental chair and has not received her original power w/c back to practice Standing. 07/05/2022- Patient able to stand with max assist at trunk today for 2 min 3 sec. Will keep goal active to ensure consistency.   Goal status: ONGOING  ASSESSMENT:  CLINICAL IMPRESSION: Continued with plan of care for LE strength. Pyramid technique utilized for several activities and patient able to demonstrate good participation with progressive resistance  and decreasing frequency. She denied any pain and able to complete all prescribed exercises today. Pt will continue to benefit from skilled PT services to optimize independence and reduced caregiver support.    OBJECTIVE IMPAIRMENTS Abnormal gait, decreased activity tolerance, decreased balance, decreased coordination, decreased endurance, decreased mobility, difficulty walking, decreased ROM, decreased strength, hypomobility, impaired flexibility, impaired UE functional use, postural dysfunction, and pain.   ACTIVITY LIMITATIONS carrying, lifting, bending, sitting, standing, squatting, sleeping, stairs, transfers, bed mobility, continence, bathing, toileting,  dressing, self feeding, reach over head, hygiene/grooming, and caring for others  PARTICIPATION LIMITATIONS: meal prep, cleaning, laundry, medication management, personal finances, interpersonal relationship, driving, shopping, community activity, and yard work  PERSONAL FACTORS Age, Time since onset of injury/illness/exacerbation, and 1-2 comorbidities: HTN, cervical Sx  are also affecting patient's functional outcome.   REHAB POTENTIAL: Good  CLINICAL DECISION MAKING: Evolving/moderate complexity  EVALUATION COMPLEXITY: Moderate  PLAN: PT FREQUENCY: 1-2x/week  PT DURATION: 12 weeks  PLANNED INTERVENTIONS: Therapeutic exercises, Therapeutic activity, Neuromuscular re-education, Balance training, Gait training, Patient/Family education, Self Care, Joint mobilization, DME instructions, Dry Needling, Electrical stimulation, Wheelchair mobility training, Spinal mobilization, Cryotherapy, Moist heat, and Manual therapy  PLAN FOR NEXT SESSION: Continue with weight bearing activities,  practice standing next visit back in // bars and LE strengthening as appropriate.   8:08 AM, 08/24/22 Louis Meckel, PT Physical Therapist - [facility]  Outpatient Physical Therapy- Main Campus [phone]      08/24/2022, 8:08 AM

## 2022-08-23 NOTE — Therapy (Addendum)
Occupational Therapy Neuro Treatment Note     Patient Name: Sherry [name] MRN: [id number] DOB:02-Sep-1934, 88 y.o., female Today's Date: 08/23/2022                                         PCP: Dr. Tillman Abide REFERRING PROVIDER: Dr. Tillman Abide   OT End of Session - 08/23/22 1204     Visit Number [id number]    Number of Visits 96    Date for OT Re-Evaluation 09/27/22    OT Start Time 1100    OT Stop Time 1143    OT Time Calculation (min) 43 min    Equipment Utilized During Treatment moist heat, powered wheelchair    Activity Tolerance Patient tolerated treatment well    Behavior During Therapy WFL for tasks assessed/performed                  Past Medical History:  Diagnosis Date   Acute blood loss anemia    Arthritis    Cancer (HCC)    skin   Central cord syndrome at C6 level of cervical spinal cord (HCC) 11/29/2017   Hypertension    Protein-calorie malnutrition, severe (HCC) 01/24/2018   S/P insertion of IVC (inferior vena caval) filter 01/24/2018   Tetraparesis (HCC)    Past Surgical History:  Procedure Laterality Date   ANTERIOR CERVICAL DECOMP/DISCECTOMY FUSION N/A 11/29/2017   Procedure: Cervical five-six, six-seven Anterior Cervical Decompression Fusion;  Surgeon: Jadene Pierini, MD;  Location: [facility] OR;  Service: Neurosurgery;  Laterality: N/A;  Cervical five-six, six-seven Anterior Cervical Decompression Fusion   CATARACT EXTRACTION     FEMUR IM NAIL Left 08/16/2021   Procedure: INTRAMEDULLARY (IM) NAIL FEMORAL;  Surgeon: Deeann Saint, MD;  Location: [facility] ORS;  Service: Orthopedics;  Laterality: Left;   IR IVC FILTER PLMT / S&I /IMG GUID/MOD SED  12/08/2017   Patient Active Problem List   Diagnosis Date Noted   Age-related osteoporosis without current pathological fracture 07/27/2022   Mild recurrent major depression (HCC) 07/27/2022   Hypertensive heart disease with other congestive heart failure (HCC) 02/14/2022   Chronic indwelling Foley catheter  02/14/2022   Closed hip fracture (HCC) 08/15/2021   DVT (deep venous thrombosis) (HCC) 08/15/2021   Quadriplegia (HCC) 08/15/2021   Fall 08/15/2021   Constipation due to slow transit 08/31/2018   Trauma 06/05/2018   Neuropathic pain 06/05/2018   Neurogenic bowel 06/05/2018   Vaginal yeast infection 01/30/2018   Healthcare-associated pneumonia 01/25/2018   Chronic allergic rhinitis 01/24/2018   Depression with anxiety 01/24/2018   UTI due to Klebsiella species 01/24/2018   Protein-calorie malnutrition, severe (HCC) 01/24/2018   S/P insertion of IVC (inferior vena caval) filter 01/24/2018   Tetraparesis (HCC) 01/20/2018   Neurogenic bladder 01/20/2018   Reactive depression    Benign essential HTN    Acute postoperative anemia due to expected blood loss    Central cord syndrome at C6 level of cervical spinal cord (HCC) 11/29/2017   Allergy to alpha-gal 11/25/2016   SCC (squamous cell carcinoma) 04/08/2014    REFERRING DIAG: Central Cord Syndrome at C6 of the Cervical Spinal Cord, Fall with Bilateral Closed Hip Fractures, with ORIF repair with Intramedullary Nailing of the Left Hip Fracture.  THERAPY DIAG:  Muscle weakness (generalized)  Rationale for Evaluation and Treatment Rehabilitation  PERTINENT HISTORY: Patient s/p fall November 29, 2017 resulting in diagnosis of central  cord syndrome at C 6 level.  She has had therapy in multiple venues but with recent move to [facility], therapy staff recommended she seek outpatient therapy for her needs. Pt. Had a recent fall with Bilateral Closed Hip Fractures, with ORIF repair with Intramedullary Nailing of the Left Hip Fracture.   PRECAUTIONS: None  SUBJECTIVE:   Pt. reports is doing well today and had a good weekend.  PAIN:  Are you having pain? No  OBJECTIVE:    Today's Treatment:   Manual Therapy:   Pt. tolerated soft tissue massage to the volar, and dorsal surface of each digit on the bilateral hands, and thenar eminence  secondary to stiffness following moist heat modality. Soft tissue mobilization was performed for Carpal, and metacarpal spread stretches. Manual therapy was performed independent of, and in preparation for therapeutic Ex.   Therapeutic Ex.    Pt. tolerated PROM followed by AROM bilateral wrist extension, PROM for bilateral digit MP, PIP, and DIP flexion, and extension, thumb radial, and palmar abduction. Emphasis was placed on left wrist extension with moist heat modality 2/2 tightness. Pt. worked on the BUE for strengthening, and reciprocal motion using the UBE while seated for 8 min. with no resistance. Constant monitoring was provided.     PATIENT EDUCATION: RUE Distal ROM, and strength Person educated: Patient Education method: Explanation and Demonstration Education comprehension: verbalized understanding, returned demonstration, and needs further education   HOME EXERCISE PROGRAM Continue with ongoing HEPs  Measurements:  12/09/2021:  Shoulder flexion: R: 120(136), L: 138(150) Shoulder abduction: R: 108(120), L: 110(120) Elbow: R: 0-148, L: 0-146 Wrist flexion: R: 55(62), L: 78 Writ extension: R: 34(55), L: 3(4) RD: R: 12(20), L: 16(26) UD: R: 8(24), L: 6(18) Thumb radial abduction: R: 11(20), L: 8(20) Digit flexion to the [facility]: R:  2nd: 5cm(3.5cm), 3rd: 7.5 cm(5cm), 4th: 7cm(3cm), 5th: 6cm(5.5cm) L: 2nd: 8cm(8cm), 3rd: 8cm(8cm), 4th: 7.5cm(7cm), 5th: 7cm(7cm)  02/03/2022:   Shoulder flexion: R: 122(138), L: 148(154) Shoulder abduction: R: 109(125), L: 125(125 Elbow: R: 0-148, L: 0-146 Wrist flexion: R: 60(68), L: 79 Writ extension: R: 40(55), L: 3(10) RD: R: 14(24), L: 20(28) UD: R: 8(24), L: 6(18) Thumb radial abduction: R: 20(25), L: 8(20) Digit flexion to the [facility]: R:  2nd: 4.5cm(3cm), 3rd: 6.5cm(4.5cm), 4th: 6.5 cm(3cm), 5th: 4.5cm(5cm) L: 2nd: 8cm(8cm), 3rd: 8cm(7cm), 4th: 7.5cm(7cm), 5th: 6.5cm(6.5cm)   04/07/2022:   Shoulder flexion: R: 125(150), L:  160(160) Shoulder abduction: R: 109(125), L: 125(125 Elbow: R: 0-148, L: 0-146 Wrist flexion: R: 60(68), L: 79 Writ extension: R: 40(55), L: 5(18) RD: R: 14(24), L: 20(28) UD: R: 18(24), L: 8(18) Thumb radial abduction: R: 20(25), L: 8(20) Digit flexion to the [facility]: R:  2nd: 4.5 cm(4 cm), 3rd: 6.5cm(3cm), 4th: 7 cm (5cm), 5th: 5cm(5cm) L: 2nd: 8cm(8cm), 3rd: 8cm(7cm), 4th: 7.5cm(7cm), 5th: 7cm(7cm)   07/05/2022:   Shoulder flexion: R: 125(154), L: 160(160) Shoulder abduction: R: 95(130), L: 143(143) Elbow: R: 0-148, L: 0-146 Wrist flexion: R: 60(68), L: 79 Writ extension: R: 40(60), L: 0(12) RD: R: 10(24), L: 12(20) UD: R: 16(24), L: 8(16) Thumb radial abduction: R: 20(25), L: 8(20) Digit flexion to the [facility]: R:  2nd: 4 cm(4cm), 3rd: 6.5cm( 3.5cm), 4th: 7 cm (5cm), 5th: 6cm(5cm) L: 2nd: 8cm(8cm), 3rd: 8cm(7cm), 4th: 8cm(7cm), 5th: 7cm(7cm)            OT Long Term Goals - 07/06/21 1106  Target Date:  09/27/2022      OT LONG TERM GOAL #1   Title Patient and caregiver will demonstrate understanding of home exercise program for ROM.    Baseline 12/09/2021: Pt. Reports the restorative ROM program has not resumed at the facility. 10/28/2021: Pt. Is attempting exercises/stretches at home. The facility restorative aides have not resumed exercises. 09/14/2021:  Pt. Reports inconsistencies with restorative ROM at  her care home. Pt/caregiver education to be provided about ongoing ROM needs.    Time 12    Period Weeks    Status Deferred     OT LONG TERM GOAL #2   Title Pt.  will improve BUE strength to be able to sustain her UE's in elevation to efficiently, and effectively perform hair care, and self-grooming tasks.   Baseline 02/03/2022: Pt. has improved, and is now able to sustain her Bilateral UEs in elevation long enough to efficiently, and effectively perform haircare, and self-grooming tasks.  12/09/2021: right shoulder 4-/5, left shoulder 4/5. Pt. has improved to be able to reach up for hair care, and self-grooming tasks. 10/28/2021: Right shoulder 3+/5, Left shoulder 4-/5 09/14/2021:  BUE shoulder strength: 3+/5 for shoulder flexion, and abduction.   Time 12    Period Weeks    Status Achieved               OT LONG TERM GOAL #3   Title Patient will demonstrate improved composite finger flexion to be able to firmly hold  and use adaptive devices during ADLs, and IADLs.   Baseline 07/05/2022: Pt. presents with digit MP, PIP, and DIP extensor tightness limiting her ability to securely grip objects in her bilateral hands.  04/07/2022: Pt. Has improved with right 2nd, and 3rd digit flexion towards the [facility]. Pt. Continues to have difficulty securely holding and applying deodorant.02/03/2022: Pt. Has improved with digit flexion, however continues to have difficulty securely holding and using deoderant. 12/09/2021: Bilateral hand/digit MP, PIP, and DIP extension tightness limits her ability achieve digit flexion to hold, and apply deodorant. 10/28/2021:  Pt. continues to have difficulty holding the deodorant. 01/12/2021: Pt. Presents with limited digit extension. Pt. Is able to initiate holding deodorant, however is unable to hold it while using it.   Time 12    Period Weeks    Status Revised   Target Date                  OT LONG TERM GOAL #4   Title Pt. Will improve bilateral wrist extension in preparation for anticipating, and initiating reaching for objects at the table.    Baseline 07/19/2022: Bilateral wrist extension in limited. 07/05/2022: Right: 40(60) Left: 0(12) 04/07/2022: Rght: 40(55) Left: 5(18) 02/03/2022: Right: 34(55) Left: 3(4) 12/09/2021: Right  34(55)  Left  3(4) 10/28/2021:  Right 22(38), Left 0(15) 09/14/2021:  Right: 17(35), left 2(15)   Time 12   Period weeks   Status Ongoing   Target Date      OT LONG TERM GOAL #5   Title Pt. will write, and sign her name with 100%  legibility, and modified independence.    Baseline 07/19/2022:  Pt. continues to be able to consistently fill out daily meal menus, and puzzles. 6/03/204: Pt. Is consistently filling out  daily meal menus, and puzzles. 04/07/2022: Pt. Reports that she continues to present with shaky appearing writing. Pt.reports that she is writing more when filling out her menu, and complete puzzles 02/03/2022: Pt. Continues to report legibility appears "shaky" when attempting signing  her name. Pt.reports that she is writing more when filling out her menu, and complete puzzles.12/09/2021: Pt. Reports legibility appears "shaky" when attempting signing her name. Pt.reports that she is writing more when filling out her menu. 10/28/2021: pt. is able to sign her name, however legibility continues to be inconsistent. Pt. Reports "shaky appearance". 09/14/2021: Pt. continues to fill out her daily menu and complete puzzles. Pt. Continues to present with limited writing legibility.   Time 12    Period Weeks    Status  Ongoing         OT LONG TERM GOAL  #6   TITLE Pt. will turn pages in a book with modified independence.    Baseline 02/03/2022: Pt. Is able to effectively turn pages of a book. 10/28/2021: Pt. Is able to consistently turn book pages. 09/14/2021: Pt. has difficulty turning pages.    Time 12    Period Weeks    Status Achieved       OT LONG TERM GOAL  #7  TITLE Pt. Will increase  bilateral lateral pinch strength by 2 lbs to be able to securely grasp items during ADLs, and IADL tasks.   Baseline 07/19/2022: TBD 07/05/2022: TBD 07/2022: NT-Pinch meter out for calibration. 02/03/2022: Right: 6#, Left: 4# 12/09/2021:  Right: 6#, Left: 4#   Time 12  Period Weeks  Status Ongoing           OT LONG TERM GOAL  #8  TITLE Pt. Will button a sweater with large 1" buttons, and zip her jacket with modified independence.   Baseline 06/07/2022: Pt. is able to use a button hook to button a sweater. Pt. Reports that she does not  wear button down clothing often.  04/07/2022: Pt. Has difficulty manipulating buttons, however was introduced to a buttonhook. Pt. Was able to hold, and use a buttonhook for buttons on a shirt. 02/03/2022: Pt. is unable to manipulate buttons on her sweater, or zippers on her jacket.  Time 12  Period Weeks  Status Achieved   OT LONG TERM GOAL  #9  TITLE Pt. will complete plant care with modified independence.  Baseline 07/19/2022: Pt. Continues to be able to water, and care for some  of her plants. Pt. Has more difficulty with plants that are harder to reach. 07/05/2022: pt. Is able to water, and care for some  of her plants. Pt. Has more difficulty with plants that are harder to reach. 04/07/2022: Pt. is now able to hold a cup and water her plants.02/03/2022: Pt. has difficulty caring for her plants.   Time 12  Period Weeks  Status Ongoing   OT LONG TERM GOAL  #10  TITLE Pt. will demonstrate adaptive techniques to assist with the efficiency of self-dressing, or morning care tasks.  Baseline 07/19/2022: Pt. continues to require assist with self-dressing/morning care tasks.   07/05/2022: Continue with goal.  04/07/2022: Pt. Continues to require assist with the efficiency of self-dressing, and morning care tasks.02/03/2022: Pt. requires assist from caregivers 2/2 time limitations during morning care.   Time 12  Period Weeks  Status Ongoing   Title Pt.  will improve BUE strength to improve ADL, and IADL functioning.  Baseline 07/19/2022:  BUE strength continues to be limited. 07/05/2022: shoulder flexion: right 4-/5, abduction: 3+/5, elbow flexion: right: 5/5, left 5/5, extension: right: 4-/5, left 4-/5, wrist extension: right: 3-/5, left: 2-/5  Time 12   Period Weeks   Status Ongoing     Plan - 07/22/21 1737  Clinical Impression Statement Pt. continues to  tolerate Manual therapy, there. Ex., and ROM well today.  Pt. continues to present with tightness at the bilateral thumb webspace, and bilateral  wrist flexor tightness, as well as digit MP, PIP, and DIP extensor tightness which continues to limit her ability to complete ADL tasks efficiently. Pt. continues to benefit from working on impoving ROM in order to work towards increasing bilateral hand grasp on objects, and increase engagement of bilateral hands during ADLs, and IADL tasks.               OT Occupational Profile and History Detailed Assessment- Review of Records and additional review of physical, cognitive, psychosocial history related to current functional performance    Occupational performance deficits (Please refer to evaluation for details): ADL's;IADL's;Leisure;Social Participation    Body Structure / Function / Physical Skills ADL;Continence;Dexterity;Flexibility;Strength;ROM;Balance;Coordination;FMC;IADL;Endurance;UE functional use;Decreased knowledge of use of DME;[facility]    Psychosocial Skills Coping Strategies    Rehab Potential Fair    Clinical Decision Making Several treatment options, min-mod task modification necessary    Comorbidities Affecting Occupational Performance: Presence of comorbidities impacting occupational performance    Comorbidities impacting occupational performance description: contractures of bilateral hands, dependent transfers,    Modification or Assistance to Complete Evaluation  No modification of tasks or assist necessary to complete eval    OT Frequency 2x / week    OT Duration 12 weeks    OT Treatment/Interventions Self-care/ADL training;Cryotherapy;Therapeutic exercise;DME and/or AE instruction;Balance training;Neuromuscular education;Manual Therapy;Splinting;Moist Heat;Passive range of motion;Therapeutic activities;Patient/family education    Plan continue to progress ROM of digits, wrists and shoulders as it pertains to completion of ADL tasks that pt values.    Consulted and Agree with Plan of Care Patient         Herma Carson, OTS 12:06 PM, 08/23/2022  Olegario Messier, MS, OTR/L   08/23/2022

## 2022-08-25 ENCOUNTER — Ambulatory Visit: Payer: Medicare HMO | Admitting: Occupational Therapy

## 2022-08-25 ENCOUNTER — Ambulatory Visit: Payer: Medicare HMO

## 2022-08-26 ENCOUNTER — Ambulatory Visit: Payer: Medicare HMO | Admitting: Occupational Therapy

## 2022-08-30 ENCOUNTER — Ambulatory Visit: Payer: Medicare HMO | Admitting: Occupational Therapy

## 2022-08-30 ENCOUNTER — Ambulatory Visit: Payer: Medicare HMO

## 2022-08-31 ENCOUNTER — Encounter: Payer: Self-pay | Admitting: Nurse Practitioner

## 2022-08-31 ENCOUNTER — Non-Acute Institutional Stay (INDEPENDENT_AMBULATORY_CARE_PROVIDER_SITE_OTHER): Payer: Medicare HMO | Admitting: Nurse Practitioner

## 2022-08-31 DIAGNOSIS — Z0000 Encounter for general adult medical examination without abnormal findings: Secondary | ICD-10-CM

## 2022-08-31 NOTE — Progress Notes (Signed)
Subjective:   Sherry [name] is a 88 y.o. female who presents for Medicare Annual (Subsequent) preventive examination.  Visit Complete: In person at [facility]  Patient Medicare AWV questionnaire was completed by the patient on 7/30; I have confirmed that all information answered by patient is correct and no changes since this date.  Review of Systems     Cardiac Risk Factors include: advanced age (>44men, >42 women);sedentary lifestyle     Objective:    Today's Vitals   08/31/22 1527  BP: 126/68  Pulse: 78  Resp: 18  Temp: (!) 97.5 F (36.4 C)  SpO2: 96%  Weight: 170 lb 6.4 oz (77.3 kg)  Height: 5\' 7"  (1.702 m)   Body mass index is 26.69 kg/m.     08/31/2022    3:41 PM 07/21/2022   10:55 AM 05/20/2022    9:05 AM 04/23/2022    1:43 PM 04/21/2022    1:49 PM 04/21/2022   11:55 AM 04/19/2022   11:09 AM  Advanced Directives  Does Patient Have a Medical Advance Directive? Yes Yes Yes Yes Yes Yes Yes  Type of Estate agent of [place];Out of facility DNR (pink MOST or yellow form);Living will Healthcare Power of [place];Out of facility DNR (pink MOST or yellow form);Living will Healthcare Power of [place];Out of facility DNR (pink MOST or yellow form);Living will Healthcare Power of [place];Living will;Out of facility DNR (pink MOST or yellow form) Healthcare Power of [place];Out of facility DNR (pink MOST or yellow form);Living will Healthcare Power of [place];Living will Healthcare Power of [place];Out of facility DNR (pink MOST or yellow form);Living will  Does patient want to make changes to medical advance directive? No - Patient declined No - Patient declined No - Patient declined No - Patient declined No - Patient declined  No - Patient declined  Copy of Healthcare Power of Attorney in Chart? Yes - validated most recent copy scanned in chart (See row information) Yes - validated most recent copy scanned in chart (See row information) Yes -  validated most recent copy scanned in chart (See row information) Yes - validated most recent copy scanned in chart (See row information) Yes - validated most recent copy scanned in chart (See row information)  Yes - validated most recent copy scanned in chart (See row information)    Current Medications (verified) Outpatient Encounter Medications as of 08/31/2022  Medication Sig   acetaminophen (TYLENOL) 500 MG tablet Take 500 mg by mouth every 12 (twelve) hours as needed for mild pain.   acetaminophen (TYLENOL) 650 MG CR tablet Take 650 mg by mouth every 8 (eight) hours as needed for pain.   acetic acid 0.25 % irrigation Use 30ml via irrigation twice daily to keep foley Cath patent.   alendronate (FOSAMAX) 70 MG tablet Take 70 mg by mouth every Saturday.   calcium carbonate (CALCIUM 600) 600 MG TABS tablet Take 600 mg by mouth at bedtime.   Capsaicin 0.1 % CREA Apply 1 application  topically every evening. (Apply to hands)   cetirizine (ZYRTEC) 5 MG tablet Take 5 mg by mouth 2 (two) times daily.   cholecalciferol (VITAMIN D3) 25 MCG (1000 UNIT) tablet Take 1,000 Units by mouth daily.   DULoxetine (CYMBALTA) 60 MG capsule Take 60 mg by mouth daily.   ferrous sulfate 325 (65 FE) MG tablet Take 325 mg by mouth daily with breakfast.   gabapentin (NEURONTIN) 100 MG capsule Take 1 capsule (100 mg total) by mouth daily.  hydroxypropyl methylcellulose / hypromellose (ISOPTO TEARS / GONIOVISC) 2.5 % ophthalmic solution Place 1 drop into both eyes every hour as needed for dry eyes.   Infant Care Products [facility]) OINT Apply to buttocks/gluteal folds topically every shift.   ketoconazole (NIZORAL) 2 % shampoo Apply 1 Application topically 2 (two) times a week.   lactulose, encephalopathy, (CHRONULAC) 10 GM/15ML SOLN Take 30 mLs by mouth daily as needed (mild constipation).   magnesium citrate solution Take 60-120 mLs by mouth daily as needed (constipation).   methylcellulose oral powder Take 1  packet by mouth at bedtime.   mometasone (ELOCON) 0.1 % lotion Apply 1 Application topically every Saturday at 6 PM. (Apply to the ear canal for eczema)   Multiple Vitamin (MULTIVITAMIN WITH MINERALS) TABS tablet Take 1 tablet by mouth daily.   polyethylene glycol (MIRALAX / GLYCOLAX) 17 g packet Take 17 g by mouth 2 (two) times daily.   rivaroxaban (XARELTO) 10 MG TABS tablet Take 10 mg by mouth daily.   saccharomyces boulardii (FLORASTOR) 250 MG capsule Take 250 mg by mouth daily.   Saline Spray 0.2 % SOLN Place 2 sprays into both nostrils as needed (nasal congestion).   simethicone (MYLICON) 80 MG chewable tablet Chew 80 mg by mouth every 6 (six) hours as needed for flatulence.   sodium phosphate (FLEET) 7-19 GM/118ML ENEM Place 1 enema rectally every other day as needed for severe constipation.   spironolactone (ALDACTONE) 25 MG tablet Take 1 tablet (25 mg total) by mouth daily.   tiZANidine (ZANAFLEX) 2 MG tablet Take 1 tablet (2 mg total) by mouth 2 (two) times daily.   triamcinolone cream (KENALOG) 0.1 % Apply to thigh and lower legs topically every 8 hours as needed. Apply to back of neck topically as needed.   Zinc Oxide (TRIPLE PASTE) 12.8 % ointment Apply 1 Application topically. Every shift.   [DISCONTINUED] carboxymethylcellulose (REFRESH PLUS) 0.5 % SOLN Place 2 drops into both eyes every 6 (six) hours as needed (dry eyes).   [DISCONTINUED] guaiFENesin (MUCINEX) 600 MG 12 hr tablet Take 600 mg by mouth 2 (two) times daily as needed for to loosen phlegm.   No facility-administered encounter medications on file as of 08/31/2022.    Allergies (verified) Other, Lisinopril-hydrochlorothiazide, Beef-derived products, Dairy aid [tilactase], Lisinopril-hydrochlorothiazide, and Pork-derived products   History: Past Medical History:  Diagnosis Date   Acute blood loss anemia    Arthritis    Cancer (HCC)    skin   Central cord syndrome at C6 level of cervical spinal cord (HCC)  11/29/2017   Hypertension    Protein-calorie malnutrition, severe (HCC) 01/24/2018   S/P insertion of IVC (inferior vena caval) filter 01/24/2018   Tetraparesis (HCC)    Past Surgical History:  Procedure Laterality Date   ANTERIOR CERVICAL DECOMP/DISCECTOMY FUSION N/A 11/29/2017   Procedure: Cervical five-six, six-seven Anterior Cervical Decompression Fusion;  Surgeon: Jadene Pierini, MD;  Location: [facility] OR;  Service: Neurosurgery;  Laterality: N/A;  Cervical five-six, six-seven Anterior Cervical Decompression Fusion   CATARACT EXTRACTION     FEMUR IM NAIL Left 08/16/2021   Procedure: INTRAMEDULLARY (IM) NAIL FEMORAL;  Surgeon: Deeann Saint, MD;  Location: [facility] ORS;  Service: Orthopedics;  Laterality: Left;   IR IVC FILTER PLMT / S&I /IMG GUID/MOD SED  12/08/2017   Family History  Problem Relation Age of Onset   Breast cancer Maternal Aunt 78   Social History   Socioeconomic History   Marital status: Married    Spouse name: Not  on file   Number of children: Not on file   Years of education: Not on file   Highest education level: Not on file  Occupational History   Not on file  Tobacco Use   Smoking status: Never   Smokeless tobacco: Never  Vaping Use   Vaping status: Never Used  Substance and Sexual Activity   Alcohol use: Never   Drug use: Never   Sexual activity: Not on file  Other Topics Concern   Not on file  Social History Narrative   ** Merged History Encounter **       Social Determinants of Health   Financial Resource Strain: Not on file  Food Insecurity: Not on file  Transportation Needs: Not on file  Physical Activity: Not on file  Stress: Not on file  Social Connections: Not on file    Tobacco Counseling Counseling given: Not Answered   Clinical Intake:  Pre-visit preparation completed: Yes  Pain : No/denies pain     BMI - recorded: 26 Nutritional Status: BMI 25 -29 Overweight Nutritional Risks: None  How often do you need to have  someone help you when you read instructions, pamphlets, or other written materials from your doctor or pharmacy?: 1 - Never         Activities of Daily Living    08/31/2022    2:40 PM  In your present state of health, do you have any difficulty performing the following activities:  Hearing? 0  Vision? 0  Difficulty concentrating or making decisions? 0  Walking or climbing stairs? 1  Dressing or bathing? 1  Doing errands, shopping? 1  Preparing Food and eating ? Y  Using the Toilet? Y  In the past six months, have you accidently leaked urine? Y  Do you have problems with loss of bowel control? Y  Managing your Medications? Y  Managing your Finances? Y  Housekeeping or managing your Housekeeping? Y    Patient Care Team: Earnestine Mealing, MD as PCP - General (Family Medicine)  Indicate any recent Medical Services you may have received from other than [facility] providers in the past year (date may be approximate).     Assessment:   This is a routine wellness examination for Sherry [name].  Hearing/Vision screen No results found.  Dietary issues and exercise activities discussed:     Goals Addressed   None    Depression Screen    03/29/2022   10:12 AM  PHQ 2/9 Scores  PHQ - 2 Score 0    Fall Risk    08/31/2022    2:42 PM 03/29/2022   10:11 AM 02/22/2018    2:19 PM  Fall Risk   Falls in the past year? 0 0 Exclusion - non ambulatory  Comment   hoyer lift  Number falls in past yr: 0 0   Injury with Fall?  0   Risk for fall due to :  No Fall Risks     MEDICARE RISK AT HOME:   TIMED UP AND GO:  Was the test performed?  No    Cognitive Function:        Immunizations Immunization History  Administered Date(s) Administered   Covid-19, Mrna,Vaccine(Spikevax)79yrs and older 05/11/2022   Influenza, High Dose Seasonal PF 11/17/2021   Influenza-Unspecified 11/14/2018, 11/20/2019, 11/17/2020, 11/17/2021   Moderna Covid-19 Vaccine Bivalent Booster 22yrs & up 06/30/2021,  12/11/2021   Moderna Sars-Covid-2 Vaccination 02/09/2019, 03/09/2019, 12/14/2019, 06/20/2020, 10/24/2020   PPD Test 05/16/2019   Tdap 11/29/2017  TDAP status: Up to date  Flu Vaccine status: Up to date  Pneumococcal vaccine status: Due, Education has been provided regarding the importance of this vaccine. Advised may receive this vaccine at local pharmacy or Health Dept. Aware to provide a copy of the vaccination record if obtained from local pharmacy or Health Dept. Verbalized acceptance and understanding.  Covid-19 vaccine status: Information provided on how to obtain vaccines.   Qualifies for Shingles Vaccine? Yes   Zostavax completed No   Shingrix Completed?: No.    Education has been provided regarding the importance of this vaccine. Patient has been advised to call insurance company to determine out of pocket expense if they have not yet received this vaccine. Advised may also receive vaccine at local pharmacy or Health Dept. Verbalized acceptance and understanding.  Screening Tests Health Maintenance  Topic Date Due   Zoster Vaccines- Shingrix (1 of 2) Never done   Pneumonia Vaccine 25+ Years old (1 of 1 - PCV) Never done   COVID-19 Vaccine (9 - 2023-24 season) 07/06/2022   INFLUENZA VACCINE  09/02/2022   Medicare Annual Wellness (AWV)  08/31/2023   DTaP/Tdap/Td (2 - Td or Tdap) 11/30/2027   HPV VACCINES  Aged Out   DEXA SCAN  Discontinued    Health Maintenance  Health Maintenance Due  Topic Date Due   Zoster Vaccines- Shingrix (1 of 2) Never done   Pneumonia Vaccine 30+ Years old (1 of 1 - PCV) Never done   COVID-19 Vaccine (9 - 2023-24 season) 07/06/2022    Colorectal cancer screening: No longer required.   Mammogram status: No longer required due to age.  Not candidate for bone density due to immobility   Lung Cancer Screening: (Low Dose CT Chest recommended if Age 86-80 years, 20 pack-year currently smoking OR have quit w/in 15years.) does not qualify.    Lung Cancer Screening Referral: na  Additional Screening:  Hepatitis C Screening: does not qualify  Vision Screening: Recommended annual ophthalmology exams for early detection of glaucoma and other disorders of the eye. Is the patient up to date with their annual eye exam?  No  Who is the provider or what is the name of the office in which the patient attends annual eye exams? [facility] If pt is not established with a provider, would they like to be referred to a provider to establish care? No .   Dental Screening: Recommended annual dental exams for proper oral hygiene Community Resource Referral / Chronic Care Management: CRR required this visit?  No   CCM required this visit?  No     Plan:     I have personally reviewed and noted the following in the patient's chart:   Medical and social history Use of alcohol, tobacco or illicit drugs  Current medications and supplements including opioid prescriptions. Patient is not currently taking opioid prescriptions. Functional ability and status Nutritional status Physical activity Advanced directives List of other physicians Hospitalizations, surgeries, and ER visits in previous 12 months Vitals Screenings to include cognitive, depression, and falls Referrals and appointments  In addition, I have reviewed and discussed with patient certain preventive protocols, quality metrics, and best practice recommendations. A written personalized care plan for preventive services as well as general preventive health recommendations were provided to patient.     Sharon Seller, NP   08/31/2022

## 2022-08-31 NOTE — Patient Instructions (Signed)
  Sherry [name] , Thank you for taking time to come for your Medicare Wellness Visit. I appreciate your ongoing commitment to your health goals. Please review the following plan we discussed and let me know if I can assist you in the future.      This is a list of the screening recommended for you and due dates:  Health Maintenance  Topic Date Due   Zoster (Shingles) Vaccine (1 of 2) Never done   Pneumonia Vaccine (1 of 1 - PCV) Never done   COVID-19 Vaccine (8 - 2023-24 season) 02/05/2022   Flu Shot  09/02/2022   Medicare Annual Wellness Visit  08/31/2023   DTaP/Tdap/Td vaccine (2 - Td or Tdap) 11/30/2027   HPV Vaccine  Aged Out   DEXA scan (bone density measurement)  Discontinued   Will order pneumonia and shingles vaccines to be given in facility.

## 2022-09-01 ENCOUNTER — Ambulatory Visit: Payer: Medicare HMO | Admitting: Occupational Therapy

## 2022-09-01 ENCOUNTER — Ambulatory Visit: Payer: Medicare HMO

## 2022-09-01 DIAGNOSIS — R262 Difficulty in walking, not elsewhere classified: Secondary | ICD-10-CM

## 2022-09-01 DIAGNOSIS — R2681 Unsteadiness on feet: Secondary | ICD-10-CM

## 2022-09-01 DIAGNOSIS — M6281 Muscle weakness (generalized): Secondary | ICD-10-CM | POA: Diagnosis not present

## 2022-09-01 DIAGNOSIS — S14129S Central cord syndrome at unspecified level of cervical spinal cord, sequela: Secondary | ICD-10-CM

## 2022-09-01 DIAGNOSIS — S72001A Fracture of unspecified part of neck of right femur, initial encounter for closed fracture: Secondary | ICD-10-CM

## 2022-09-01 DIAGNOSIS — R278 Other lack of coordination: Secondary | ICD-10-CM

## 2022-09-01 DIAGNOSIS — R269 Unspecified abnormalities of gait and mobility: Secondary | ICD-10-CM

## 2022-09-01 NOTE — Therapy (Addendum)
Occupational Therapy Neuro Treatment Note     Patient Name: Sherry [name] MRN: [id number] DOB:03-17-1934, 88 y.o., female Today's Date: 09/01/2022                                         PCP: Dr. Tillman Abide REFERRING PROVIDER: Dr. Tillman Abide   OT End of Session - 09/01/22 1146     Visit Number [id number]    Number of Visits 96    Date for OT Re-Evaluation 09/27/22    OT Start Time 1100    OT Stop Time 1142    OT Time Calculation (min) 42 min    Equipment Utilized During Treatment moist heat, powered wheelchair    Activity Tolerance Patient tolerated treatment well    Behavior During Therapy WFL for tasks assessed/performed                  Past Medical History:  Diagnosis Date   Acute blood loss anemia    Arthritis    Cancer (HCC)    skin   Central cord syndrome at C6 level of cervical spinal cord (HCC) 11/29/2017   Hypertension    Protein-calorie malnutrition, severe (HCC) 01/24/2018   S/P insertion of IVC (inferior vena caval) filter 01/24/2018   Tetraparesis (HCC)    Past Surgical History:  Procedure Laterality Date   ANTERIOR CERVICAL DECOMP/DISCECTOMY FUSION N/A 11/29/2017   Procedure: Cervical five-six, six-seven Anterior Cervical Decompression Fusion;  Surgeon: Jadene Pierini, MD;  Location: [facility] OR;  Service: Neurosurgery;  Laterality: N/A;  Cervical five-six, six-seven Anterior Cervical Decompression Fusion   CATARACT EXTRACTION     FEMUR IM NAIL Left 08/16/2021   Procedure: INTRAMEDULLARY (IM) NAIL FEMORAL;  Surgeon: Deeann Saint, MD;  Location: [facility] ORS;  Service: Orthopedics;  Laterality: Left;   IR IVC FILTER PLMT / S&I /IMG GUID/MOD SED  12/08/2017   Patient Active Problem List   Diagnosis Date Noted   Age-related osteoporosis without current pathological fracture 07/27/2022   Mild recurrent major depression (HCC) 07/27/2022   Hypertensive heart disease with other congestive heart failure (HCC) 02/14/2022   Chronic indwelling Foley catheter  02/14/2022   Closed hip fracture (HCC) 08/15/2021   DVT (deep venous thrombosis) (HCC) 08/15/2021   Quadriplegia (HCC) 08/15/2021   Fall 08/15/2021   Constipation due to slow transit 08/31/2018   Trauma 06/05/2018   Neuropathic pain 06/05/2018   Neurogenic bowel 06/05/2018   Vaginal yeast infection 01/30/2018   Healthcare-associated pneumonia 01/25/2018   Chronic allergic rhinitis 01/24/2018   Depression with anxiety 01/24/2018   UTI due to Klebsiella species 01/24/2018   Protein-calorie malnutrition, severe (HCC) 01/24/2018   S/P insertion of IVC (inferior vena caval) filter 01/24/2018   Tetraparesis (HCC) 01/20/2018   Neurogenic bladder 01/20/2018   Reactive depression    Benign essential HTN    Acute postoperative anemia due to expected blood loss    Central cord syndrome at C6 level of cervical spinal cord (HCC) 11/29/2017   Allergy to alpha-gal 11/25/2016   SCC (squamous cell carcinoma) 04/08/2014    REFERRING DIAG: Central Cord Syndrome at C6 of the Cervical Spinal Cord, Fall with Bilateral Closed Hip Fractures, with ORIF repair with Intramedullary Nailing of the Left Hip Fracture.  THERAPY DIAG:  Muscle weakness (generalized)  Rationale for Evaluation and Treatment Rehabilitation  PERTINENT HISTORY: Patient s/p fall November 29, 2017 resulting in diagnosis of central  cord syndrome at C 6 level.  She has had therapy in multiple venues but with recent move to [facility], therapy staff recommended she seek outpatient therapy for her needs. Pt. Had a recent fall with Bilateral Closed Hip Fractures, with ORIF repair with Intramedullary Nailing of the Left Hip Fracture.   PRECAUTIONS: None  SUBJECTIVE:   Pt. reports is doing well today and stayed up late last night to watch the Olympics.   PAIN:  Are you having pain? No  OBJECTIVE:    Today's Treatment:   Manual Therapy:   Pt. tolerated soft tissue massage to the volar, and dorsal surface of each digit on the  bilateral hands, and thenar eminence secondary to stiffness following moist heat modality. Soft tissue mobilization was performed for Carpal, and metacarpal spread stretches. Manual therapy was performed independent of, and in preparation for therapeutic Ex.   Therapeutic Ex.    Pt. tolerated PROM followed by AROM bilateral wrist extension, PROM for bilateral digit MP, PIP, and DIP flexion, and extension, thumb radial, and palmar abduction. Pt. performed 2# dowel ex. for UE strengthening secondary to weakness. Bilateral shoulder flexion, chest press were performed.  Pt. worked on the BUE for strengthening, and reciprocal motion using the UBE while seated for 8 min. with no resistance. Constant monitoring was provided.     PATIENT EDUCATION: RUE Distal ROM, and strength Person educated: Patient Education method: Explanation and Demonstration Education comprehension: verbalized understanding, returned demonstration, and needs further education   HOME EXERCISE PROGRAM Continue with ongoing HEPs  Measurements:  12/09/2021:  Shoulder flexion: R: 120(136), L: 138(150) Shoulder abduction: R: 108(120), L: 110(120) Elbow: R: 0-148, L: 0-146 Wrist flexion: R: 55(62), L: 78 Writ extension: R: 34(55), L: 3(4) RD: R: 12(20), L: 16(26) UD: R: 8(24), L: 6(18) Thumb radial abduction: R: 11(20), L: 8(20) Digit flexion to the [facility]: R:  2nd: 5cm(3.5cm), 3rd: 7.5 cm(5cm), 4th: 7cm(3cm), 5th: 6cm(5.5cm) L: 2nd: 8cm(8cm), 3rd: 8cm(8cm), 4th: 7.5cm(7cm), 5th: 7cm(7cm)  02/03/2022:   Shoulder flexion: R: 122(138), L: 148(154) Shoulder abduction: R: 109(125), L: 125(125 Elbow: R: 0-148, L: 0-146 Wrist flexion: R: 60(68), L: 79 Writ extension: R: 40(55), L: 3(10) RD: R: 14(24), L: 20(28) UD: R: 8(24), L: 6(18) Thumb radial abduction: R: 20(25), L: 8(20) Digit flexion to the [facility]: R:  2nd: 4.5cm(3cm), 3rd: 6.5cm(4.5cm), 4th: 6.5 cm(3cm), 5th: 4.5cm(5cm) L: 2nd: 8cm(8cm), 3rd: 8cm(7cm), 4th:  7.5cm(7cm), 5th: 6.5cm(6.5cm)   04/07/2022:   Shoulder flexion: R: 125(150), L: 160(160) Shoulder abduction: R: 109(125), L: 125(125 Elbow: R: 0-148, L: 0-146 Wrist flexion: R: 60(68), L: 79 Writ extension: R: 40(55), L: 5(18) RD: R: 14(24), L: 20(28) UD: R: 18(24), L: 8(18) Thumb radial abduction: R: 20(25), L: 8(20) Digit flexion to the [facility]: R:  2nd: 4.5 cm(4 cm), 3rd: 6.5cm(3cm), 4th: 7 cm (5cm), 5th: 5cm(5cm) L: 2nd: 8cm(8cm), 3rd: 8cm(7cm), 4th: 7.5cm(7cm), 5th: 7cm(7cm)   07/05/2022:   Shoulder flexion: R: 125(154), L: 160(160) Shoulder abduction: R: 95(130), L: 143(143) Elbow: R: 0-148, L: 0-146 Wrist flexion: R: 60(68), L: 79 Writ extension: R: 40(60), L: 0(12) RD: R: 10(24), L: 12(20) UD: R: 16(24), L: 8(16) Thumb radial abduction: R: 20(25), L: 8(20) Digit flexion to the [facility]: R:  2nd: 4 cm(4cm), 3rd: 6.5cm( 3.5cm), 4th: 7 cm (5cm), 5th: 6cm(5cm) L: 2nd: 8cm(8cm), 3rd: 8cm(7cm), 4th: 8cm(7cm), 5th: 7cm(7cm)            OT Long Term Goals - 07/06/21 1106  Target Date:  09/27/2022      OT LONG TERM GOAL #1   Title Patient and caregiver will demonstrate understanding of home exercise program for ROM.    Baseline 12/09/2021: Pt. Reports the restorative ROM program has not resumed at the facility. 10/28/2021: Pt. Is attempting exercises/stretches at home. The facility restorative aides have not resumed exercises. 09/14/2021:  Pt. Reports inconsistencies with restorative ROM at  her care home. Pt/caregiver education to be provided about ongoing ROM needs.    Time 12    Period Weeks    Status Deferred     OT LONG TERM GOAL #2   Title Pt.  will improve BUE strength to be able to sustain her UE's in elevation to efficiently, and effectively perform hair care, and self-grooming tasks.   Baseline 02/03/2022: Pt. has improved, and is now able to sustain her Bilateral UEs in elevation long  enough to efficiently, and effectively perform haircare, and self-grooming tasks. 12/09/2021: right shoulder 4-/5, left shoulder 4/5. Pt. has improved to be able to reach up for hair care, and self-grooming tasks. 10/28/2021: Right shoulder 3+/5, Left shoulder 4-/5 09/14/2021:  BUE shoulder strength: 3+/5 for shoulder flexion, and abduction.   Time 12    Period Weeks    Status Achieved               OT LONG TERM GOAL #3   Title Patient will demonstrate improved composite finger flexion to be able to firmly hold  and use adaptive devices during ADLs, and IADLs.   Baseline 07/05/2022: Pt. presents with digit MP, PIP, and DIP extensor tightness limiting her ability to securely grip objects in her bilateral hands.  04/07/2022: Pt. Has improved with right 2nd, and 3rd digit flexion towards the [facility]. Pt. Continues to have difficulty securely holding and applying deodorant.02/03/2022: Pt. Has improved with digit flexion, however continues to have difficulty securely holding and using deoderant. 12/09/2021: Bilateral hand/digit MP, PIP, and DIP extension tightness limits her ability achieve digit flexion to hold, and apply deodorant. 10/28/2021:  Pt. continues to have difficulty holding the deodorant. 01/12/2021: Pt. Presents with limited digit extension. Pt. Is able to initiate holding deodorant, however is unable to hold it while using it.   Time 12    Period Weeks    Status Revised   Target Date                  OT LONG TERM GOAL #4   Title Pt. Will improve bilateral wrist extension in preparation for anticipating, and initiating reaching for objects at the table.    Baseline 07/19/2022: Bilateral wrist extension in limited. 07/05/2022: Right: 40(60) Left: 0(12) 04/07/2022: Rght: 40(55) Left: 5(18) 02/03/2022: Right: 34(55) Left: 3(4) 12/09/2021: Right  34(55)  Left  3(4) 10/28/2021:  Right 22(38), Left 0(15) 09/14/2021:  Right: 17(35), left 2(15)   Time 12   Period weeks   Status Ongoing   Target Date       OT LONG TERM GOAL #5   Title Pt. will write, and sign her name with 100% legibility, and modified independence.    Baseline 07/19/2022:  Pt. continues to be able to consistently fill out daily meal menus, and puzzles. 6/03/204: Pt. Is consistently filling out  daily meal menus, and puzzles. 04/07/2022: Pt. Reports that she continues to present with shaky appearing writing. Pt.reports that she is writing more when filling out her menu, and complete puzzles 02/03/2022: Pt. Continues to report legibility appears "shaky" when attempting signing  her name. Pt.reports that she is writing more when filling out her menu, and complete puzzles.12/09/2021: Pt. Reports legibility appears "shaky" when attempting signing her name. Pt.reports that she is writing more when filling out her menu. 10/28/2021: pt. is able to sign her name, however legibility continues to be inconsistent. Pt. Reports "shaky appearance". 09/14/2021: Pt. continues to fill out her daily menu and complete puzzles. Pt. Continues to present with limited writing legibility.   Time 12    Period Weeks    Status  Ongoing         OT LONG TERM GOAL  #6   TITLE Pt. will turn pages in a book with modified independence.    Baseline 02/03/2022: Pt. Is able to effectively turn pages of a book. 10/28/2021: Pt. Is able to consistently turn book pages. 09/14/2021: Pt. has difficulty turning pages.    Time 12    Period Weeks    Status Achieved       OT LONG TERM GOAL  #7  TITLE Pt. Will increase  bilateral lateral pinch strength by 2 lbs to be able to securely grasp items during ADLs, and IADL tasks.   Baseline 07/19/2022: TBD 07/05/2022: TBD 07/2022: NT-Pinch meter out for calibration. 02/03/2022: Right: 6#, Left: 4# 12/09/2021:  Right: 6#, Left: 4#   Time 12  Period Weeks  Status Ongoing           OT LONG TERM GOAL  #8  TITLE Pt. Will button a sweater with large 1" buttons, and zip her jacket with modified independence.   Baseline 06/07/2022: Pt. is able  to use a button hook to button a sweater. Pt. Reports that she does not wear button down clothing often.  04/07/2022: Pt. Has difficulty manipulating buttons, however was introduced to a buttonhook. Pt. Was able to hold, and use a buttonhook for buttons on a shirt. 02/03/2022: Pt. is unable to manipulate buttons on her sweater, or zippers on her jacket.  Time 12  Period Weeks  Status Achieved   OT LONG TERM GOAL  #9  TITLE Pt. will complete plant care with modified independence.  Baseline 07/19/2022: Pt. Continues to be able to water, and care for some  of her plants. Pt. Has more difficulty with plants that are harder to reach. 07/05/2022: pt. Is able to water, and care for some  of her plants. Pt. Has more difficulty with plants that are harder to reach. 04/07/2022: Pt. is now able to hold a cup and water her plants.02/03/2022: Pt. has difficulty caring for her plants.   Time 12  Period Weeks  Status Ongoing   OT LONG TERM GOAL  #10  TITLE Pt. will demonstrate adaptive techniques to assist with the efficiency of self-dressing, or morning care tasks.  Baseline 07/19/2022: Pt. continues to require assist with self-dressing/morning care tasks.   07/05/2022: Continue with goal.  04/07/2022: Pt. Continues to require assist with the efficiency of self-dressing, and morning care tasks.02/03/2022: Pt. requires assist from caregivers 2/2 time limitations during morning care.   Time 12  Period Weeks  Status Ongoing   Title Pt.  will improve BUE strength to improve ADL, and IADL functioning.  Baseline 07/19/2022:  BUE strength continues to be limited. 07/05/2022: shoulder flexion: right 4-/5, abduction: 3+/5, elbow flexion: right: 5/5, left 5/5, extension: right: 4-/5, left 4-/5, wrist extension: right: 3-/5, left: 2-/5  Time 12   Period Weeks   Status Ongoing     Plan - 07/22/21 1737  Clinical Impression Statement Pt. continues to  tolerate Manual therapy, there. Ex., and ROM well today.  Pt. continues to  present with tightness at the bilateral thumb webspace, and bilateral wrist flexor tightness, as well as digit MP, PIP, and DIP extensor tightness which continues to limit her ability to complete ADL tasks efficiently. Pt. was able to tolerate 2# dowel ex  Pt. continues to benefit from working on impoving ROM in order to work towards increasing bilateral hand grasp on objects, and increase engagement of bilateral hands during ADLs, and IADL tasks.               OT Occupational Profile and History Detailed Assessment- Review of Records and additional review of physical, cognitive, psychosocial history related to current functional performance    Occupational performance deficits (Please refer to evaluation for details): ADL's;IADL's;Leisure;Social Participation    Body Structure / Function / Physical Skills ADL;Continence;Dexterity;Flexibility;Strength;ROM;Balance;Coordination;FMC;IADL;Endurance;UE functional use;Decreased knowledge of use of DME;[facility]    Psychosocial Skills Coping Strategies    Rehab Potential Fair    Clinical Decision Making Several treatment options, min-mod task modification necessary    Comorbidities Affecting Occupational Performance: Presence of comorbidities impacting occupational performance    Comorbidities impacting occupational performance description: contractures of bilateral hands, dependent transfers,    Modification or Assistance to Complete Evaluation  No modification of tasks or assist necessary to complete eval    OT Frequency 2x / week    OT Duration 12 weeks    OT Treatment/Interventions Self-care/ADL training;Cryotherapy;Therapeutic exercise;DME and/or AE instruction;Balance training;Neuromuscular education;Manual Therapy;Splinting;Moist Heat;Passive range of motion;Therapeutic activities;Patient/family education    Plan continue to progress ROM of digits, wrists and shoulders as it pertains to completion of ADL tasks that pt values.    Consulted and Agree with  Plan of Care Patient         Herma Carson, OTS 11:51 AM, 09/01/2022  This entire session was performed under the direct supervision and direction of a licensed therapist. I have personally read, edited, and approve of the note as written.   Olegario Messier, MS, OTR/L  09/01/2022

## 2022-09-01 NOTE — Therapy (Signed)
OUTPATIENT PHYSICAL THERAPY NEURO TREATMENT   Patient Name: Sherry [name] MRN: [id number] DOB:May 07, 1934, 88 y.o., female Today's Date: 09/01/2022   PCP: Earnestine Mealing, MD REFERRING PROVIDER: Deeann Saint   PT End of Session - 09/01/22 1145     Visit Number 62    Number of Visits 74    Date for PT Re-Evaluation 09/27/22    Progress Note Due on Visit 70    PT Start Time 1146    PT Stop Time 1217    PT Time Calculation (min) 31 min    Equipment Utilized During Treatment Gait belt    Activity Tolerance Patient tolerated treatment well;Patient limited by fatigue;No increased pain    Behavior During Therapy WFL for tasks assessed/performed              Past Medical History:  Diagnosis Date   Acute blood loss anemia    Arthritis    Cancer (HCC)    skin   Central cord syndrome at C6 level of cervical spinal cord (HCC) 11/29/2017   Hypertension    Protein-calorie malnutrition, severe (HCC) 01/24/2018   S/P insertion of IVC (inferior vena caval) filter 01/24/2018   Tetraparesis (HCC)    Past Surgical History:  Procedure Laterality Date   ANTERIOR CERVICAL DECOMP/DISCECTOMY FUSION N/A 11/29/2017   Procedure: Cervical five-six, six-seven Anterior Cervical Decompression Fusion;  Surgeon: Jadene Pierini, MD;  Location: [facility];  Service: Neurosurgery;  Laterality: N/A;  Cervical five-six, six-seven Anterior Cervical Decompression Fusion   CATARACT EXTRACTION     FEMUR IM NAIL Left 08/16/2021   Procedure: INTRAMEDULLARY (IM) NAIL FEMORAL;  Surgeon: Deeann Saint, MD;  Location: [facility] ORS;  Service: Orthopedics;  Laterality: Left;   IR IVC FILTER PLMT / S&I /IMG GUID/MOD SED  12/08/2017   Patient Active Problem List   Diagnosis Date Noted   Age-related osteoporosis without current pathological fracture 07/27/2022   Mild recurrent major depression (HCC) 07/27/2022   Hypertensive heart disease with other congestive heart failure (HCC) 02/14/2022   Chronic indwelling  Foley catheter 02/14/2022   Closed hip fracture (HCC) 08/15/2021   DVT (deep venous thrombosis) (HCC) 08/15/2021   Quadriplegia (HCC) 08/15/2021   Fall 08/15/2021   Constipation due to slow transit 08/31/2018   Trauma 06/05/2018   Neuropathic pain 06/05/2018   Neurogenic bowel 06/05/2018   Vaginal yeast infection 01/30/2018   Healthcare-associated pneumonia 01/25/2018   Chronic allergic rhinitis 01/24/2018   Depression with anxiety 01/24/2018   UTI due to Klebsiella species 01/24/2018   Protein-calorie malnutrition, severe (HCC) 01/24/2018   S/P insertion of IVC (inferior vena caval) filter 01/24/2018   Tetraparesis (HCC) 01/20/2018   Neurogenic bladder 01/20/2018   Reactive depression    Benign essential HTN    Acute postoperative anemia due to expected blood loss    Central cord syndrome at C6 level of cervical spinal cord (HCC) 11/29/2017   Allergy to alpha-gal 11/25/2016   SCC (squamous cell carcinoma) 04/08/2014    ONSET DATE: 08/15/2021 (fall with B hip fx); Initial injury was in 2019- quadraplegia due to central cord syndrome of C6 secondary to neck fracture.   REFERRING DIAG: Bilateral Hip fx; Left Open- s/p Left IM nail femoral on 08/16/2021; Right closed  THERAPY DIAG:  Muscle weakness (generalized)  Other lack of coordination  Central cord syndrome, sequela (HCC)  Abnormality of gait and mobility  Difficulty in walking, not elsewhere classified  Closed fracture of both hips, initial encounter (HCC)  Unsteadiness on feet  Rationale for  Evaluation and Treatment Rehabilitation  SUBJECTIVE:   SUBJECTIVE STATEMENT:  Patient reports dealing with a lot of bloating missing last 2 visits States still having issues.     Pt accompanied by:  Paid caregiver-   PERTINENT HISTORY: Sherry [name] is an 88yoF who experienced a fall at home on 08/15/2021 with hip fracture, s/p Left hip ORIF. PMH: quadriplegia due to central cord syndrome of C6 secondary to neck  fracture, HTN, depression with anxiety, neurogenic bladder, bilateral DVT on Xarelto (s/p of IVC filter placement). Prior history of significant PT/OT with improvement of function. Pt has full use of shoulder/elbows, limited use of hands, adaptive self feeding sucessful.     PAIN:  None   OBJECTIVE:    TODAY'S TREATMENT:  THEREX:   Seated therex:  Quad sets with 3 sec hold 2 x 15 reps each LE. Seated hip abd/add with PT holding LE up for straight leg 2 sets of 15 reps.  -Seated knee flex (AROM) 2 sets of 10 reps  -seated ankle DF x 20 reps each LE  - seated knee ext 2 x 15 reps each LE   Pt educated throughout session about proper posture and technique with exercises. Improved exercise technique, movement at target joints, use of target muscles after min to mod verbal, visual, tactile cues.   PATIENT EDUCATION: Education details: PT plan of care/use of resistive bands Person educated: Patient Education method: Explanation, Demonstration, Tactile cues, and Verbal cues Education comprehension: verbalized understanding, returned demonstration, verbal cues required, tactile cues required, and needs further education   HOME EXERCISE PROGRAM: No changes at this time  Access Code: [facility] URL: [email or web address] Date: 11/23/2021 Prepared by: Precious Bard Exercises - Seated Gluteal Sets - 2 x daily - 7 x weekly - 2 sets - 10 reps - 5 hold - Seated Quad Set - 2 x daily - 7 x weekly - 2 sets - 10 reps - 5 hold - Seated Long Arc Quad - 2 x daily - 7 x weekly - 2 sets - 10 reps - 5 hold - Seated March - 2 x daily - 7 x weekly - 2 sets - 10 reps - 5 hold - Seated Hip Abduction - 2 x daily - 7 x weekly - 2 sets - 10 reps - 5 hold - Seated Shoulder Shrugs - 2 x daily - 7 x weekly - 2 sets - 10 reps - 5 hold - Wheelchair Pressure Relief - 2 x daily - 7 x weekly - 2 sets - 10 reps - 5 hold  GOALS: Goals reviewed with patient? Yes  SHORT TERM GOALS: Target date: 02/22/2022  Pt  will be independent with initial UE strengthening HEP in order to improve strength and balance in order to decrease fall risk and improve function at home and work. Baseline: 10/19/2021- patient with no formal UE HEP; 12/02/2021= Patient verbalized knowledge of HEP including use of theraband for UE strengthening.  Goal status: GOAL MET  LONG TERM GOALS: Target date: 09/27/2022  Pt will be independent with final for UE/LE HEP in order to improve strength and balance in order to decrease fall risk and improve function at home and work. Baseline: Patient is currently BLE NWB and unable to participate in HEP. Has order for UE strengthening. 01/11/2022- Patient now able to participate in LE strengthening although NWB still- good understanding for some basic exercises. Will keep goal active to incorporate progressive LE strengthening exercises. 04/07/2022= Patient still participating in progressive LE seated HEP and has  no questions at this time.  Goal status: MET  2.  Pt will improve FOTO to target score of 35  to display perceived improvements in ability to complete ADL's.  Baseline: 10/19/2021= 12; 12/02/2021= 17; 01/11/2022=17; 04/07/2022= 17; 07/05/2022=17 Goal status: Ongoing  3.  Pt will increase strength of B UE  by at least 1/2 MMT grade in order to demonstrate improvement in strength and function  Baseline: patient has range of 2-/5 to 4/5 BUE Strength; 12/02/2021= 4/5 except with wrist ext. 01/11/2022= 4/5 B UE strength expept for wrist Goal status: Goal revised-no longer appropriate- working with OT on all UE strengthening.   4. Pt. Will increase strength of RLE by at least 1/2 MMT grade in order to demonstrate improvement in Standing/transfers.  Baseline: 2-/5 Right hip flex/knee ext/flex; 04/07/2022= 2- with right hip flex/knee ext (lacking 28 deg from zero) 4/8= Patient able to ext right knee lacking 18 deg from zero. 07/05/2022=Left knee approx 8 deg from zero and right knee = 23 deg from zero  Goal  status: Progressing  5. Pt. Will demo ability to stand pivot transfer with max assist for improved functional mobility and less dependent need on mechanical device.   Baseline: Dependent on hoyer lift for all transfers; 04/07/2022- Unable to test secondary to patient with recent UTI and right hand procedure - will attempt to reassess next visit. ; 04/12/22: Unable to successfully stand due to feet plates on loaned power w/c; 05/10/2022- Patient still having to use rental chair and has not received her original power w/c back to practice SPT. 07/04/2021= Patient just received new power w/c and attempted standing from 1st time today- Patient able to stand with max assist today from w/c and did not attempt to perform pivot transfer- difficulty with static standing and placing weight on right LE.   Goal status: ONGOING  6. Pt. Will demonstrate improved functional LE strength as seen by ability to stand > 2 min for improved transfer ability and pregait abilities.   Baseline: not assessed today due to recent dx: UTI; 04/07/2022- Unable to test secondary to patient with recent UTI and right hand procedure - will attempt to reassess next visit; 04/12/22: Unable to successfully stand due to feet plates on loaned power w/c 05/10/2022- Patient still having to use rental chair and has not received her original power w/c back to practice Standing. 07/05/2022- Patient able to stand with max assist at trunk today for 2 min 3 sec. Will keep goal active to ensure consistency.   Goal status: ONGOING  ASSESSMENT:  CLINICAL IMPRESSION: Treatment limited to seated LE strengthening today as patient recovering from missing over a week of PT with stomach issues and reports some better but still not well. She was more tired overall requiring some physical assist to lift Right LE but remains very motivated to improve her strength completing 2 sets of exercises for most activities.  Pt will continue to benefit from skilled PT services to  optimize independence and reduced caregiver support.    OBJECTIVE IMPAIRMENTS Abnormal gait, decreased activity tolerance, decreased balance, decreased coordination, decreased endurance, decreased mobility, difficulty walking, decreased ROM, decreased strength, hypomobility, impaired flexibility, impaired UE functional use, postural dysfunction, and pain.   ACTIVITY LIMITATIONS carrying, lifting, bending, sitting, standing, squatting, sleeping, stairs, transfers, bed mobility, continence, bathing, toileting, dressing, self feeding, reach over head, hygiene/grooming, and caring for others  PARTICIPATION LIMITATIONS: meal prep, cleaning, laundry, medication management, personal finances, interpersonal relationship, driving, shopping, community activity, and yard work  PERSONAL FACTORS Age, Time since onset of injury/illness/exacerbation, and 1-2 comorbidities: HTN, cervical Sx  are also affecting patient's functional outcome.   REHAB POTENTIAL: Good  CLINICAL DECISION MAKING: Evolving/moderate complexity  EVALUATION COMPLEXITY: Moderate  PLAN: PT FREQUENCY: 1-2x/week  PT DURATION: 12 weeks  PLANNED INTERVENTIONS: Therapeutic exercises, Therapeutic activity, Neuromuscular re-education, Balance training, Gait training, Patient/Family education, Self Care, Joint mobilization, DME instructions, Dry Needling, Electrical stimulation, Wheelchair mobility training, Spinal mobilization, Cryotherapy, Moist heat, and Manual therapy  PLAN FOR NEXT SESSION: Continue with weight bearing activities,  practice standing next visit back in // bars and LE strengthening as appropriate.   12:22 PM, 09/01/22 Louis Meckel, PT Physical Therapist - [facility] [phone]     09/01/2022, 12:22 PM

## 2022-09-06 ENCOUNTER — Ambulatory Visit: Payer: Medicare HMO | Admitting: Occupational Therapy

## 2022-09-06 ENCOUNTER — Ambulatory Visit: Payer: Medicare HMO

## 2022-09-07 ENCOUNTER — Encounter: Payer: Self-pay | Admitting: Nurse Practitioner

## 2022-09-07 ENCOUNTER — Non-Acute Institutional Stay (SKILLED_NURSING_FACILITY): Payer: Medicare HMO | Admitting: Nurse Practitioner

## 2022-09-07 DIAGNOSIS — D509 Iron deficiency anemia, unspecified: Secondary | ICD-10-CM

## 2022-09-07 DIAGNOSIS — G8250 Quadriplegia, unspecified: Secondary | ICD-10-CM | POA: Diagnosis not present

## 2022-09-07 DIAGNOSIS — I110 Hypertensive heart disease with heart failure: Secondary | ICD-10-CM

## 2022-09-07 DIAGNOSIS — F330 Major depressive disorder, recurrent, mild: Secondary | ICD-10-CM | POA: Diagnosis not present

## 2022-09-07 DIAGNOSIS — I10 Essential (primary) hypertension: Secondary | ICD-10-CM

## 2022-09-07 DIAGNOSIS — M810 Age-related osteoporosis without current pathological fracture: Secondary | ICD-10-CM

## 2022-09-07 DIAGNOSIS — Z978 Presence of other specified devices: Secondary | ICD-10-CM

## 2022-09-07 DIAGNOSIS — N319 Neuromuscular dysfunction of bladder, unspecified: Secondary | ICD-10-CM

## 2022-09-07 DIAGNOSIS — I824Y3 Acute embolism and thrombosis of unspecified deep veins of proximal lower extremity, bilateral: Secondary | ICD-10-CM

## 2022-09-07 DIAGNOSIS — I5089 Other heart failure: Secondary | ICD-10-CM

## 2022-09-07 NOTE — Progress Notes (Signed)
Location:  Other [place].  Nursing Home Room Number: [facility] [address] Place of Service:  SNF [phone]) Abbey Chatters, NP  PCP: Earnestine Mealing, MD  Patient Care Team: Earnestine Mealing, MD as PCP - General (Family Medicine)  Extended Emergency Contact Information Primary Emergency Contact: [name],[name] Mobile Phone: [phone] Relation: Daughter Secondary Emergency Contact: Liset, Mcaleese Address: [address]          [place], [place] [address] [place] of [place] Home Phone: [phone] Mobile Phone: [phone] Relation: Spouse  Goals of care: Advanced Directive information    09/07/2022   12:31 PM  Advanced Directives  Does Patient Have a Medical Advance Directive? Yes  Type of Estate agent of [place];Out of facility DNR (pink MOST or yellow form);Living will  Does patient want to make changes to medical advance directive? No - Patient declined  Copy of Healthcare Power of Attorney in Chart? Yes - validated most recent copy scanned in chart (See row information)     Chief Complaint  Patient presents with   Medical Management of Chronic Issues    Medical Management of Chronic Issues.     HPI:  Pt is a 88 y.o. female seen today for medical management of chronic disease.  Pt with hx of quadriplegia, looking to get new AFO. She is standing with PT at this time and needs new AFO because the current orthotics slide when she is trying to ambulate.  Overall she is doing well. Denies any abnormal pains.  Reports mood is doing well.  No nursing concerns at this time   Past Medical History:  Diagnosis Date   Acute blood loss anemia    Arthritis    Cancer (HCC)    skin   Central cord syndrome at C6 level of cervical spinal cord (HCC) 11/29/2017   Hypertension    Protein-calorie malnutrition, severe (HCC) 01/24/2018   S/P insertion of IVC (inferior vena caval) filter 01/24/2018   Tetraparesis (HCC)    Past Surgical History:   Procedure Laterality Date   ANTERIOR CERVICAL DECOMP/DISCECTOMY FUSION N/A 11/29/2017   Procedure: Cervical five-six, six-seven Anterior Cervical Decompression Fusion;  Surgeon: Jadene Pierini, MD;  Location: [facility] OR;  Service: Neurosurgery;  Laterality: N/A;  Cervical five-six, six-seven Anterior Cervical Decompression Fusion   CATARACT EXTRACTION     FEMUR IM NAIL Left 08/16/2021   Procedure: INTRAMEDULLARY (IM) NAIL FEMORAL;  Surgeon: Deeann Saint, MD;  Location: [facility] ORS;  Service: Orthopedics;  Laterality: Left;   IR IVC FILTER PLMT / S&I /IMG GUID/MOD SED  12/08/2017    Allergies  Allergen Reactions   Other Swelling   Lisinopril-Hydrochlorothiazide Swelling    Swelling of the tongue   Beef-Derived Products Swelling    Facial and lip swelling (due to tick bite)   Dairy Aid [Tilactase] Swelling   Lisinopril-Hydrochlorothiazide Swelling    Tongue swelling   Pork-Derived Products Swelling    Outpatient Encounter Medications as of 09/07/2022  Medication Sig   acetaminophen (TYLENOL) 500 MG tablet Take 500 mg by mouth every 12 (twelve) hours as needed for mild pain.   acetaminophen (TYLENOL) 650 MG CR tablet Take 650 mg by mouth every 8 (eight) hours as needed for pain.   acetic acid 0.25 % irrigation Use 30ml via irrigation twice daily to keep foley Cath patent.   alendronate (FOSAMAX) 70 MG tablet Take 70 mg by mouth every Saturday.   calcium carbonate (CALCIUM 600) 600 MG TABS tablet Take 600 mg by mouth at bedtime.  Capsaicin 0.1 % CREA Apply 1 application  topically every evening. (Apply to hands)   cetirizine (ZYRTEC) 5 MG tablet Take 5 mg by mouth 2 (two) times daily.   cholecalciferol (VITAMIN D3) 25 MCG (1000 UNIT) tablet Take 1,000 Units by mouth daily.   DULoxetine (CYMBALTA) 60 MG capsule Take 60 mg by mouth daily.   ferrous sulfate 325 (65 FE) MG tablet Take 325 mg by mouth daily with breakfast.   gabapentin (NEURONTIN) 100 MG capsule Take 1 capsule (100 mg total)  by mouth daily.   hydroxypropyl methylcellulose / hypromellose (ISOPTO TEARS / GONIOVISC) 2.5 % ophthalmic solution Place 1 drop into both eyes every hour as needed for dry eyes.   [facility]) OINT Apply to buttocks/gluteal folds topically every shift.   ketoconazole (NIZORAL) 2 % shampoo Apply 1 Application topically 2 (two) times a week.   lactulose, encephalopathy, (CHRONULAC) 10 GM/15ML SOLN Take 30 mLs by mouth daily as needed (mild constipation).   magnesium citrate solution Take 60-120 mLs by mouth daily as needed (constipation).   methylcellulose oral powder Take 1 packet by mouth at bedtime.   mometasone (ELOCON) 0.1 % lotion Apply 1 Application topically every Saturday at 6 PM. (Apply to the ear canal for eczema)   Multiple Vitamin (MULTIVITAMIN WITH MINERALS) TABS tablet Take 1 tablet by mouth daily.   polyethylene glycol (MIRALAX / GLYCOLAX) 17 g packet Take 17 g by mouth 2 (two) times daily.   rivaroxaban (XARELTO) 10 MG TABS tablet Take 10 mg by mouth daily.   saccharomyces boulardii (FLORASTOR) 250 MG capsule Take 250 mg by mouth daily.   Saline Spray 0.2 % SOLN Place 2 sprays into both nostrils as needed (nasal congestion).   simethicone (MYLICON) 80 MG chewable tablet Chew 80 mg by mouth every 6 (six) hours as needed for flatulence.   sodium phosphate (FLEET) 7-19 GM/118ML ENEM Place 1 enema rectally every other day as needed for severe constipation.   spironolactone (ALDACTONE) 25 MG tablet Take 1 tablet (25 mg total) by mouth daily.   tiZANidine (ZANAFLEX) 2 MG tablet Take 1 tablet (2 mg total) by mouth 2 (two) times daily.   triamcinolone cream (KENALOG) 0.1 % Apply to thigh and lower legs topically every 8 hours as needed. Apply to back of neck topically as needed.   Zinc Oxide (TRIPLE PASTE) 12.8 % ointment Apply 1 Application topically. Every shift.   No facility-administered encounter medications on file as of 09/07/2022.    Review of Systems   Constitutional:  Negative for activity change, appetite change, fatigue and unexpected weight change.  HENT:  Negative for congestion and hearing loss.   Eyes: Negative.   Respiratory:  Negative for cough and shortness of breath.   Cardiovascular:  Negative for chest pain, palpitations and leg swelling.  Gastrointestinal:  Negative for abdominal pain, constipation and diarrhea.  Genitourinary:  Negative for difficulty urinating and dysuria.  Musculoskeletal:  Negative for arthralgias and myalgias.  Skin:  Negative for color change and wound.  Neurological:  Positive for weakness (due to quadriplegia). Negative for dizziness.  Psychiatric/Behavioral:  Negative for agitation, behavioral problems and confusion.      Immunization History  Administered Date(s) Administered   Covid-19, Mrna,Vaccine(Spikevax)38yrs and older 05/11/2022   Influenza, High Dose Seasonal PF 11/17/2021   Influenza-Unspecified 11/14/2018, 11/20/2019, 11/17/2020, 11/17/2021   Moderna Covid-19 Vaccine Bivalent Booster 26yrs & up 06/30/2021, 12/11/2021   Moderna Sars-Covid-2 Vaccination 02/09/2019, 03/09/2019, 12/14/2019, 06/20/2020, 10/24/2020   PPD Test 05/16/2019  Tdap 11/29/2017   Pertinent  Health Maintenance Due  Topic Date Due   INFLUENZA VACCINE  09/02/2022   DEXA SCAN  Discontinued      08/19/2021   10:00 AM 08/19/2021    8:54 PM 08/20/2021    8:20 AM 03/29/2022   10:11 AM 08/31/2022    2:42 PM  Fall Risk  Falls in the past year?    0 0  Was there an injury with Fall?    0   Fall Risk Category Calculator    0   (RETIRED) Patient Fall Risk Level High fall risk High fall risk High fall risk    Patient at Risk for Falls Due to    No Fall Risks    Functional Status Survey:    Vitals:   09/07/22 1222  BP: 128/70  Pulse: 72  Resp: 18  Temp: (!) 97.5 F (36.4 C)  SpO2: 95%  Weight: 171 lb 6.4 oz (77.7 kg)  Height: 5\' 7"  (1.702 m)   Body mass index is 26.85 kg/m. Physical Exam Constitutional:       General: She is not in acute distress.    Appearance: She is well-developed. She is not diaphoretic.  HENT:     Head: Normocephalic and atraumatic.     Mouth/Throat:     Pharynx: No oropharyngeal exudate.  Eyes:     Conjunctiva/sclera: Conjunctivae normal.     Pupils: Pupils are equal, round, and reactive to light.  Cardiovascular:     Rate and Rhythm: Normal rate and regular rhythm.     Heart sounds: Normal heart sounds.  Pulmonary:     Effort: Pulmonary effort is normal.     Breath sounds: Normal breath sounds.  Abdominal:     General: Bowel sounds are normal.     Palpations: Abdomen is soft.  Musculoskeletal:     Cervical back: Normal range of motion and neck supple.     Right lower leg: No edema.     Left lower leg: No edema.  Skin:    General: Skin is warm and dry.  Neurological:     Mental Status: She is alert and oriented to person, place, and time.     Sensory: Sensory deficit present.     Motor: Weakness present.     Gait: Gait abnormal.  Psychiatric:        Mood and Affect: Mood normal.     Labs reviewed: Recent Labs    03/08/22 0000 04/21/22 1200 06/02/22 0000  NA 133* 133* 140  K 4.0 3.8 4.6  CL 97* 100 102  CO2 28* 30 31*  GLUCOSE  --  108*  --   BUN 11 8 13   CREATININE 0.6 0.68 0.5  CALCIUM 8.0* 8.0* 9.0   Recent Labs    09/24/21 0000 02/08/22 0000  AST  --  18  ALT  --  14  ALKPHOS  --  65  ALBUMIN 3.2* 3.7   Recent Labs    02/25/22 0000 03/08/22 0000 04/21/22 1200 04/21/22 1731 04/26/22 0000 05/03/22 0000 05/10/22 0000 05/17/22 0000  WBC 5.1 6.2 7.1  --  5.7  --   --   --   NEUTROABS 2,800.00 3,683.00  --   --  3,283.00  --   --   --   HGB 10.4* 11.9* 8.5*   < > 8.3* 9.3* 10.0* 10.7*  HCT 32* 35* 26.6*   < > 26* 28* 31* 33*  MCV  --   --  99.3  --   --   --   --   --   PLT 318 360 348  --  444*  --   --   --    < > = values in this interval not displayed.   No results found for: "TSH" No results found for: "HGBA1C" No  results found for: "CHOL", "HDL", "LDLCALC", "LDLDIRECT", "TRIG", "CHOLHDL"  Significant Diagnostic Results in last 30 days:  No results found.  Assessment/Plan 1. Hypertensive heart disease with other congestive heart failure (HCC) -bEuvolemic. Will continue current regimen  2. Chronic indwelling Foley catheter Due to neurogenic bladder Continue routine cath care  3. Quadriplegia (HCC) -continue supportive care from staff  4. Mild recurrent major depression (HCC) Stable at this time, continues on cymbalta 60 mg daily   5. Age-related osteoporosis without current pathological fracture Continues on fosamax weekly   6. Benign essential HTN -Blood pressure well controlled, goal bp <140/90 Continue current medications and dietary modifications follow metabolic panel  7. Neurogenic bladder -continues with cath  8. Deep vein thrombosis (DVT) of proximal vein of both lower extremities, unspecified chronicity (HCC) -continues on xarelto daily, no signs of recurrence   9. Iron deficiency anemia, unspecified iron deficiency anemia type -stable, continues on supplement      K. Biagio Borg [facility] [phone]

## 2022-09-08 ENCOUNTER — Ambulatory Visit: Payer: Medicare HMO

## 2022-09-08 ENCOUNTER — Ambulatory Visit: Payer: Medicare HMO | Admitting: Occupational Therapy

## 2022-09-13 ENCOUNTER — Ambulatory Visit: Payer: Medicare HMO | Attending: Internal Medicine | Admitting: Occupational Therapy

## 2022-09-13 ENCOUNTER — Encounter: Payer: Self-pay | Admitting: Occupational Therapy

## 2022-09-13 ENCOUNTER — Ambulatory Visit: Payer: Medicare HMO

## 2022-09-13 DIAGNOSIS — S72002A Fracture of unspecified part of neck of left femur, initial encounter for closed fracture: Secondary | ICD-10-CM | POA: Diagnosis present

## 2022-09-13 DIAGNOSIS — R262 Difficulty in walking, not elsewhere classified: Secondary | ICD-10-CM | POA: Diagnosis present

## 2022-09-13 DIAGNOSIS — S14129S Central cord syndrome at unspecified level of cervical spinal cord, sequela: Secondary | ICD-10-CM

## 2022-09-13 DIAGNOSIS — R269 Unspecified abnormalities of gait and mobility: Secondary | ICD-10-CM | POA: Diagnosis present

## 2022-09-13 DIAGNOSIS — M6281 Muscle weakness (generalized): Secondary | ICD-10-CM

## 2022-09-13 DIAGNOSIS — R278 Other lack of coordination: Secondary | ICD-10-CM | POA: Insufficient documentation

## 2022-09-13 DIAGNOSIS — S72001A Fracture of unspecified part of neck of right femur, initial encounter for closed fracture: Secondary | ICD-10-CM | POA: Diagnosis present

## 2022-09-13 DIAGNOSIS — R2681 Unsteadiness on feet: Secondary | ICD-10-CM | POA: Diagnosis present

## 2022-09-13 NOTE — Therapy (Signed)
OUTPATIENT PHYSICAL THERAPY NEURO TREATMENT   Patient Name: Sherry [name] MRN: [id number] DOB:26-Jul-1934, 88 y.o., female Today's Date: 09/13/2022   PCP: Sherry Mealing, MD REFERRING PROVIDER: Deeann [name]   PT End of Session - 09/13/22 1441     Visit Number 63    Number of Visits 74    Date for PT Re-Evaluation 09/27/22    Progress Note Due on Visit 70    PT Start Time 1145    PT Stop Time 1218    PT Time Calculation (min) 33 min    Equipment Utilized During Treatment Gait belt    Activity Tolerance Patient tolerated treatment well;Patient limited by fatigue;No increased pain    Behavior During Therapy WFL for tasks assessed/performed              Past Medical History:  Diagnosis Date   Acute blood loss anemia    Arthritis    Cancer (HCC)    skin   Central cord syndrome at C6 level of cervical spinal cord (HCC) 11/29/2017   Hypertension    Protein-calorie malnutrition, severe (HCC) 01/24/2018   S/P insertion of IVC (inferior vena caval) filter 01/24/2018   Tetraparesis (HCC)    Past Surgical History:  Procedure Laterality Date   ANTERIOR CERVICAL DECOMP/DISCECTOMY FUSION N/A 11/29/2017   Procedure: Cervical five-six, six-seven Anterior Cervical Decompression Fusion;  Surgeon: Sherry Pierini, MD;  Location: [facility] OR;  Service: Neurosurgery;  Laterality: N/A;  Cervical five-six, six-seven Anterior Cervical Decompression Fusion   CATARACT EXTRACTION     FEMUR IM NAIL Left 08/16/2021   Procedure: INTRAMEDULLARY (IM) NAIL FEMORAL;  Surgeon: Sherry Saint, MD;  Location: [facility] ORS;  Service: Orthopedics;  Laterality: Left;   IR IVC FILTER PLMT / S&I /IMG GUID/MOD SED  12/08/2017   Patient Active Problem List   Diagnosis Date Noted   Age-related osteoporosis without current pathological fracture 07/27/2022   Mild recurrent major depression (HCC) 07/27/2022   Hypertensive heart disease with other congestive heart failure (HCC) 02/14/2022   Chronic indwelling  Foley catheter 02/14/2022   Closed hip fracture (HCC) 08/15/2021   DVT (deep venous thrombosis) (HCC) 08/15/2021   Quadriplegia (HCC) 08/15/2021   Fall 08/15/2021   Constipation due to slow transit 08/31/2018   Trauma 06/05/2018   Neuropathic pain 06/05/2018   Neurogenic bowel 06/05/2018   Vaginal yeast infection 01/30/2018   Healthcare-associated pneumonia 01/25/2018   Chronic allergic rhinitis 01/24/2018   Depression with anxiety 01/24/2018   UTI due to Klebsiella species 01/24/2018   Protein-calorie malnutrition, severe (HCC) 01/24/2018   S/P insertion of IVC (inferior vena caval) filter 01/24/2018   Tetraparesis (HCC) 01/20/2018   Neurogenic bladder 01/20/2018   Reactive depression    Benign essential HTN    Acute postoperative anemia due to expected blood loss    Central cord syndrome at C6 level of cervical spinal cord (HCC) 11/29/2017   Allergy to alpha-gal 11/25/2016   SCC (squamous cell carcinoma) 04/08/2014    ONSET DATE: 08/15/2021 (fall with B hip fx); Initial injury was in 2019- quadraplegia due to central cord syndrome of C6 secondary to neck fracture.   REFERRING DIAG: Bilateral Hip fx; Left Open- s/p Left IM nail femoral on 08/16/2021; Right closed  THERAPY DIAG:  Muscle weakness (generalized)  Other lack of coordination  Central cord syndrome, sequela (HCC)  Abnormality of gait and mobility  Difficulty in walking, not elsewhere classified  Closed fracture of both hips, initial encounter (HCC)  Unsteadiness on feet  Rationale for  Evaluation and Treatment Rehabilitation  SUBJECTIVE:   SUBJECTIVE STATEMENT:  Patient reports dealing with a lot of bloating missing last 2 visits States still having issues.     Pt accompanied by:  Sherry [name]-   PERTINENT HISTORY: Sherry [name] is an 87yoF who experienced a fall at home on 08/15/2021 with hip fracture, s/p Left hip ORIF. PMH: quadriplegia due to central cord syndrome of C6 secondary to neck  fracture, HTN, depression with anxiety, neurogenic bladder, bilateral DVT on Xarelto (s/p of IVC filter placement). Prior history of significant PT/OT with improvement of function. Pt has full use of shoulder/elbows, limited use of hands, adaptive self feeding sucessful.     PAIN:  None   OBJECTIVE:    TODAY'S TREATMENT:  THEREX:   Seated therex:  Seated knee ext with 3 sec hold 2 x 15 reps each LE. Seated hip abd/add with PT holding LE up for straight leg 2 sets of 15 reps.  -Seated knee flex (AROM) 2 sets of 10 reps  -seated ankle DF x 20 reps each LE  - seated leg press - 2 x 15 reps each LE   Pt educated throughout session about proper posture and technique with exercises. Improved exercise technique, movement at target joints, use of target muscles after min to mod verbal, visual, tactile cues.   PATIENT EDUCATION: Education details: PT plan of care/use of resistive bands Person educated: Patient Education method: Explanation, Demonstration, Tactile cues, and Verbal cues Education comprehension: verbalized understanding, returned demonstration, verbal cues required, tactile cues required, and needs further education   HOME EXERCISE PROGRAM: No changes at this time  Access Code: [facility] URL: [email or web address] Date: 11/23/2021 Prepared by: Precious Bard Exercises - Seated Gluteal Sets - 2 x daily - 7 x weekly - 2 sets - 10 reps - 5 hold - Seated Quad Set - 2 x daily - 7 x weekly - 2 sets - 10 reps - 5 hold - Seated Long Arc Quad - 2 x daily - 7 x weekly - 2 sets - 10 reps - 5 hold - Seated March - 2 x daily - 7 x weekly - 2 sets - 10 reps - 5 hold - Seated Hip Abduction - 2 x daily - 7 x weekly - 2 sets - 10 reps - 5 hold - Seated Shoulder Shrugs - 2 x daily - 7 x weekly - 2 sets - 10 reps - 5 hold - Wheelchair Pressure Relief - 2 x daily - 7 x weekly - 2 sets - 10 reps - 5 hold  GOALS: Goals reviewed with patient? Yes  SHORT TERM GOALS: Target date:  02/22/2022  Pt will be independent with initial UE strengthening HEP in order to improve strength and balance in order to decrease fall risk and improve function at home and work. Baseline: 10/19/2021- patient with no formal UE HEP; 12/02/2021= Patient verbalized knowledge of HEP including use of theraband for UE strengthening.  Goal status: GOAL MET  LONG TERM GOALS: Target date: 09/27/2022  Pt will be independent with final for UE/LE HEP in order to improve strength and balance in order to decrease fall risk and improve function at home and work. Baseline: Patient is currently BLE NWB and unable to participate in HEP. Has order for UE strengthening. 01/11/2022- Patient now able to participate in LE strengthening although NWB still- good understanding for some basic exercises. Will keep goal active to incorporate progressive LE strengthening exercises. 04/07/2022= Patient still participating in progressive LE seated HEP  and has no questions at this time.  Goal status: MET  2.  Pt will improve FOTO to target score of 35  to display perceived improvements in ability to complete ADL's.  Baseline: 10/19/2021= 12; 12/02/2021= 17; 01/11/2022=17; 04/07/2022= 17; 07/05/2022=17 Goal status: Ongoing  3.  Pt will increase strength of B UE  by at least 1/2 MMT grade in order to demonstrate improvement in strength and function  Baseline: patient has range of 2-/5 to 4/5 BUE Strength; 12/02/2021= 4/5 except with wrist ext. 01/11/2022= 4/5 B UE strength expept for wrist Goal status: Goal revised-no longer appropriate- working with OT on all UE strengthening.   4. Pt. Will increase strength of RLE by at least 1/2 MMT grade in order to demonstrate improvement in Standing/transfers.  Baseline: 2-/5 Right hip flex/knee ext/flex; 04/07/2022= 2- with right hip flex/knee ext (lacking 28 deg from zero) 4/8= Patient able to ext right knee lacking 18 deg from zero. 07/05/2022=Left knee approx 8 deg from zero and right knee = 23 deg  from zero  Goal status: Progressing  5. Pt. Will demo ability to stand pivot transfer with max assist for improved functional mobility and less dependent need on mechanical device.   Baseline: Dependent on hoyer lift for all transfers; 04/07/2022- Unable to test secondary to patient with recent UTI and right hand procedure - will attempt to reassess next visit. ; 04/12/22: Unable to successfully stand due to feet plates on loaned power w/c; 05/10/2022- Patient still having to use rental chair and has not received her original power w/c back to practice SPT. 07/04/2021= Patient just received new power w/c and attempted standing from 1st time today- Patient able to stand with max assist today from w/c and did not attempt to perform pivot transfer- difficulty with static standing and placing weight on right LE.   Goal status: ONGOING  6. Pt. Will demonstrate improved functional LE strength as seen by ability to stand > 2 min for improved transfer ability and pregait abilities.   Baseline: not assessed today due to recent dx: UTI; 04/07/2022- Unable to test secondary to patient with recent UTI and right hand procedure - will attempt to reassess next visit; 04/12/22: Unable to successfully stand due to feet plates on loaned power w/c 05/10/2022- Patient still having to use rental chair and has not received her original power w/c back to practice Standing. 07/05/2022- Patient able to stand with max assist at trunk today for 2 min 3 sec. Will keep goal active to ensure consistency.   Goal status: ONGOING  ASSESSMENT:  CLINICAL IMPRESSION: Treatment limited again to seated and reclined therex today as patient weak with missing another week of PT. She did demonstrate fatigue as limiting factor and unable to achieve full Right LE knee ext and minimal hip march. She was able to complete 2 sets of exercises for most activities again today and will attempt standing next visit.  Pt will continue to benefit from skilled PT  services to optimize independence and reduced [name] support.    OBJECTIVE IMPAIRMENTS Abnormal gait, decreased activity tolerance, decreased balance, decreased coordination, decreased endurance, decreased mobility, difficulty walking, decreased ROM, decreased strength, hypomobility, impaired flexibility, impaired UE functional use, postural dysfunction, and pain.   ACTIVITY LIMITATIONS carrying, lifting, bending, sitting, standing, squatting, sleeping, stairs, transfers, bed mobility, continence, bathing, toileting, dressing, self feeding, reach over head, hygiene/grooming, and caring for others  PARTICIPATION LIMITATIONS: meal prep, cleaning, laundry, medication management, personal finances, interpersonal relationship, driving, shopping, community activity, and  yard work  PERSONAL FACTORS Age, Time since onset of injury/illness/exacerbation, and 1-2 comorbidities: HTN, cervical Sx  are also affecting patient's functional outcome.   REHAB POTENTIAL: Good  CLINICAL DECISION MAKING: Evolving/moderate complexity  EVALUATION COMPLEXITY: Moderate  PLAN: PT FREQUENCY: 1-2x/week  PT DURATION: 12 weeks  PLANNED INTERVENTIONS: Therapeutic exercises, Therapeutic activity, Neuromuscular re-education, Balance training, Gait training, Patient/Family education, Self Care, Joint mobilization, DME instructions, Dry Needling, Electrical stimulation, Wheelchair mobility training, Spinal mobilization, Cryotherapy, Moist heat, and Manual therapy  PLAN FOR NEXT SESSION: Continue with weight bearing activities,  practice standing next visit back in // bars and LE strengthening as appropriate.   2:42 PM, 09/13/22 Louis Meckel, PT Physical Therapist - [facility]  Outpatient Physical Therapy- Main [facility] [phone]     09/13/2022, 2:42 PM

## 2022-09-13 NOTE — Therapy (Signed)
Occupational Therapy Neuro Progress Note    Dates of reporting period  07/19/22   to   09/13/22    Patient Name: Sherry [name] MRN: [id number] DOB:1934-11-19, 88 y.o., female Today's Date: 09/13/2022                                         PCP: Dr. Tillman Abide REFERRING PROVIDER: Dr. Tillman Abide   OT End of Session - 09/13/22 1056     Visit Number [id number]    Number of Visits 96    Date for OT Re-Evaluation 09/27/22    OT Start Time 1100    OT Stop Time 1145    OT Time Calculation (min) 45 min    Equipment Utilized During Treatment moist heat, powered wheelchair    Activity Tolerance Patient tolerated treatment well    Behavior During Therapy WFL for tasks assessed/performed                  Past Medical History:  Diagnosis Date   Acute blood loss anemia    Arthritis    Cancer (HCC)    skin   Central cord syndrome at C6 level of cervical spinal cord (HCC) 11/29/2017   Hypertension    Protein-calorie malnutrition, severe (HCC) 01/24/2018   S/P insertion of IVC (inferior vena caval) filter 01/24/2018   Tetraparesis (HCC)    Past Surgical History:  Procedure Laterality Date   ANTERIOR CERVICAL DECOMP/DISCECTOMY FUSION N/A 11/29/2017   Procedure: Cervical five-six, six-seven Anterior Cervical Decompression Fusion;  Surgeon: Jadene Pierini, MD;  Location: [facility] OR;  Service: Neurosurgery;  Laterality: N/A;  Cervical five-six, six-seven Anterior Cervical Decompression Fusion   CATARACT EXTRACTION     FEMUR IM NAIL Left 08/16/2021   Procedure: INTRAMEDULLARY (IM) NAIL FEMORAL;  Surgeon: Deeann Saint, MD;  Location: [facility] ORS;  Service: Orthopedics;  Laterality: Left;   IR IVC FILTER PLMT / S&I /IMG GUID/MOD SED  12/08/2017   Patient Active Problem List   Diagnosis Date Noted   Age-related osteoporosis without current pathological fracture 07/27/2022   Mild recurrent major depression (HCC) 07/27/2022   Hypertensive heart disease with other congestive heart failure  (HCC) 02/14/2022   Chronic indwelling Foley catheter 02/14/2022   Closed hip fracture (HCC) 08/15/2021   DVT (deep venous thrombosis) (HCC) 08/15/2021   Quadriplegia (HCC) 08/15/2021   Fall 08/15/2021   Constipation due to slow transit 08/31/2018   Trauma 06/05/2018   Neuropathic pain 06/05/2018   Neurogenic bowel 06/05/2018   Vaginal yeast infection 01/30/2018   Healthcare-associated pneumonia 01/25/2018   Chronic allergic rhinitis 01/24/2018   Depression with anxiety 01/24/2018   UTI due to Klebsiella species 01/24/2018   Protein-calorie malnutrition, severe (HCC) 01/24/2018   S/P insertion of IVC (inferior vena caval) filter 01/24/2018   Tetraparesis (HCC) 01/20/2018   Neurogenic bladder 01/20/2018   Reactive depression    Benign essential HTN    Acute postoperative anemia due to expected blood loss    Central cord syndrome at C6 level of cervical spinal cord (HCC) 11/29/2017   Allergy to alpha-gal 11/25/2016   SCC (squamous cell carcinoma) 04/08/2014    REFERRING DIAG: Central Cord Syndrome at C6 of the Cervical Spinal Cord, Fall with Bilateral Closed Hip Fractures, with ORIF repair with Intramedullary Nailing of the Left Hip Fracture.  THERAPY DIAG:  Muscle weakness (generalized)  Other lack of coordination  Central  cord syndrome, sequela (HCC)  Rationale for Evaluation and Treatment Rehabilitation  PERTINENT HISTORY: Patient s/p fall November 29, 2017 resulting in diagnosis of central cord syndrome at C 6 level.  She has had therapy in multiple venues but with recent move to [facility], therapy staff recommended she seek outpatient therapy for her needs. Pt. Had a recent fall with Bilateral Closed Hip Fractures, with ORIF repair with Intramedullary Nailing of the Left Hip Fracture.   PRECAUTIONS: None  SUBJECTIVE:   Pt. reports is doing well today and stayed up late last night to watch the Olympics.   PAIN:  Are you having pain? No  OBJECTIVE:    Today's  Treatment:    Therapeutic Ex.     Pt. worked on the BUE for strengthening, and reciprocal motion using the UBE while seated for 10 min. with no resistance backwards 5 min, MIN resistance forwards for 5 min. Constant monitoring was provided.     PATIENT EDUCATION: RUE Distal ROM, and strength Person educated: Patient Education method: Explanation and Demonstration Education comprehension: verbalized understanding, returned demonstration, and needs further education   HOME EXERCISE PROGRAM Continue with ongoing HEPs  Measurements:  12/09/2021:  Shoulder flexion: R: 120(136), L: 138(150) Shoulder abduction: R: 108(120), L: 110(120) Elbow: R: 0-148, L: 0-146 Wrist flexion: R: 55(62), L: 78 Writ extension: R: 34(55), L: 3(4) RD: R: 12(20), L: 16(26) UD: R: 8(24), L: 6(18) Thumb radial abduction: R: 11(20), L: 8(20) Digit flexion to the [facility]: R:  2nd: 5cm(3.5cm), 3rd: 7.5 cm(5cm), 4th: 7cm(3cm), 5th: 6cm(5.5cm) L: 2nd: 8cm(8cm), 3rd: 8cm(8cm), 4th: 7.5cm(7cm), 5th: 7cm(7cm)  02/03/2022:   Shoulder flexion: R: 122(138), L: 148(154) Shoulder abduction: R: 109(125), L: 125(125 Elbow: R: 0-148, L: 0-146 Wrist flexion: R: 60(68), L: 79 Writ extension: R: 40(55), L: 3(10) RD: R: 14(24), L: 20(28) UD: R: 8(24), L: 6(18) Thumb radial abduction: R: 20(25), L: 8(20) Digit flexion to the [facility]: R:  2nd: 4.5cm(3cm), 3rd: 6.5cm(4.5cm), 4th: 6.5 cm(3cm), 5th: 4.5cm(5cm) L: 2nd: 8cm(8cm), 3rd: 8cm(7cm), 4th: 7.5cm(7cm), 5th: 6.5cm(6.5cm)   04/07/2022:   Shoulder flexion: R: 125(150), L: 160(160) Shoulder abduction: R: 109(125), L: 125(125 Elbow: R: 0-148, L: 0-146 Wrist flexion: R: 60(68), L: 79 Writ extension: R: 40(55), L: 5(18) RD: R: 14(24), L: 20(28) UD: R: 18(24), L: 8(18) Thumb radial abduction: R: 20(25), L: 8(20) Digit flexion to the [facility]: R:  2nd: 4.5 cm(4 cm), 3rd: 6.5cm(3cm), 4th: 7 cm (5cm), 5th: 5cm(5cm) L: 2nd: 8cm(8cm), 3rd: 8cm(7cm), 4th: 7.5cm(7cm), 5th: 7cm(7cm)    07/05/2022:   Shoulder flexion: R: 125(154), L: 160(160) Shoulder abduction: R: 95(130), L: 143(143) Elbow: R: 0-148, L: 0-146 Wrist flexion: R: 60(68), L: 79 Writ extension: R: 40(60), L: 0(12) RD: R: 10(24), L: 12(20) UD: R: 16(24), L: 8(16) Thumb radial abduction: R: 20(25), L: 8(20) Digit flexion to the [facility]: R:  2nd: 4 cm(4cm), 3rd: 6.5cm( 3.5cm), 4th: 7 cm (5cm), 5th: 6cm(5cm) L: 2nd: 8cm(8cm), 3rd: 8cm(7cm), 4th: 8cm(7cm), 5th: 7cm(7cm)  09/13/2022:   Shoulder flexion: R: 125(154), L: 160(160) Shoulder abduction: R: 100(130), L: 143(143) Elbow: R: 0-150, L: 0-150 Wrist flexion: R: 55(68), L: 79 Writ extension: R: 45(60), L: 0(12) RD: R: 10(24), L: 12(20) UD: R: 16(24), L: 8(16) Thumb radial abduction: R: 20(25), L: 8(20) Digit flexion to the [facility]: R:  2nd: 4 cm(4cm), 3rd: 6cm( 5cm), 4th: 6 cm (4cm), 5th: 5cm(5cm) L: 2nd: 7cm(cm), 3rd: 7cm(6cm), 4th: 7cm(6cm), 5th: 6cm(6cm)      OT Long Term Goals - 07/06/21 1106  Target Date:  09/27/2022      OT LONG TERM GOAL #1   Title Patient and caregiver will demonstrate understanding of home exercise program for ROM.    Baseline 12/09/2021: Pt. Reports the restorative ROM program has not resumed at the facility. 10/28/2021: Pt. Is attempting exercises/stretches at home. The facility restorative aides have not resumed exercises. 09/14/2021:  Pt. Reports inconsistencies with restorative ROM at  her care home. Pt/caregiver education to be provided about ongoing ROM needs.    Time 12    Period Weeks    Status Deferred     OT LONG TERM GOAL #2   Title Pt.  will improve BUE strength to be able to sustain her UE's in elevation to efficiently, and effectively perform hair care, and self-grooming tasks.   Baseline 02/03/2022: Pt. has improved, and is now able to sustain her Bilateral UEs in elevation long enough to efficiently, and effectively perform  haircare, and self-grooming tasks. 12/09/2021: right shoulder 4-/5, left shoulder 4/5. Pt. has improved to be able to reach up for hair care, and self-grooming tasks. 10/28/2021: Right shoulder 3+/5, Left shoulder 4-/5 09/14/2021:  BUE shoulder strength: 3+/5 for shoulder flexion, and abduction.   Time 12    Period Weeks    Status Achieved               OT LONG TERM GOAL #3   Title Patient will demonstrate improved composite finger flexion to be able to firmly hold  and use adaptive devices during ADLs, and IADLs.   Baseline 09/13/22: improved digit flexion to the [facility]. 07/05/2022: Pt. presents with digit MP, PIP, and DIP extensor tightness limiting her ability to securely grip objects in her bilateral hands.  04/07/2022: Pt. Has improved with right 2nd, and 3rd digit flexion towards the [facility]. Pt. Continues to have difficulty securely holding and applying deodorant.02/03/2022: Pt. Has improved with digit flexion, however continues to have difficulty securely holding and using deoderant. 12/09/2021: Bilateral hand/digit MP, PIP, and DIP extension tightness limits her ability achieve digit flexion to hold, and apply deodorant. 10/28/2021:  Pt. continues to have difficulty holding the deodorant. 01/12/2021: Pt. Presents with limited digit extension. Pt. Is able to initiate holding deodorant, however is unable to hold it while using it.   Time 12    Period Weeks    Status Revised   Target Date                  OT LONG TERM GOAL #4   Title Pt. Will improve bilateral wrist extension in preparation for anticipating, and initiating reaching for objects at the table.    Baseline 09/13/22: R: 55(68), L: 79 07/19/2022: Bilateral wrist extension in limited. 07/05/2022: Right: 40(60) Left: 0(12) 04/07/2022: Rght: 40(55) Left: 5(18) 02/03/2022: Right: 34(55) Left: 3(4) 12/09/2021: Right  34(55)  Left  3(4) 10/28/2021:  Right 22(38), Left 0(15) 09/14/2021:  Right: 17(35), left 2(15)   Time 12   Period weeks   Status Ongoing    Target Date      OT LONG TERM GOAL #5   Title Pt. will write, and sign her name with 100% legibility, and modified independence.    Baseline 09/13/22: 100% legibility to sign name, intermittent tremors 07/19/2022:  Pt. continues to be able to consistently fill out daily meal menus, and puzzles. 6/03/204: Pt. Is consistently filling out  daily meal menus, and puzzles. 04/07/2022: Pt. Reports that she continues to present with shaky appearing writing. Pt.reports that she is writing  more when filling out her menu, and complete puzzles 02/03/2022: Pt. Continues to report legibility appears "shaky" when attempting signing her name. Pt.reports that she is writing more when filling out her menu, and complete puzzles.12/09/2021: Pt. Reports legibility appears "shaky" when attempting signing her name. Pt.reports that she is writing more when filling out her menu. 10/28/2021: pt. is able to sign her name, however legibility continues to be inconsistent. Pt. Reports "shaky appearance". 09/14/2021: Pt. continues to fill out her daily menu and complete puzzles. Pt. Continues to present with limited writing legibility.   Time 12    Period Weeks    Status  Achieved         OT LONG TERM GOAL  #6   TITLE Pt. will turn pages in a book with modified independence.    Baseline 02/03/2022: Pt. Is able to effectively turn pages of a book. 10/28/2021: Pt. Is able to consistently turn book pages. 09/14/2021: Pt. has difficulty turning pages.    Time 12    Period Weeks    Status Achieved       OT LONG TERM GOAL  #7  TITLE Pt. Will increase  bilateral lateral pinch strength by 2 lbs to be able to securely grasp items during ADLs, and IADL tasks.   Baseline 09/13/22: R 6.5# L 4# 07/19/2022: TBD 07/05/2022: TBD 07/2022: NT-Pinch meter out for calibration. 02/03/2022: Right: 6#, Left: 4# 12/09/2021:  Right: 6#, Left: 4#   Time 12  Period Weeks  Status Ongoing           OT LONG TERM GOAL  #8  TITLE Pt. Will button a sweater  with large 1" buttons, and zip her jacket with modified independence.   Baseline 06/07/2022: Pt. is able to use a button hook to button a sweater. Pt. Reports that she does not wear button down clothing often.  04/07/2022: Pt. Has difficulty manipulating buttons, however was introduced to a buttonhook. Pt. Was able to hold, and use a buttonhook for buttons on a shirt. 02/03/2022: Pt. is unable to manipulate buttons on her sweater, or zippers on her jacket.  Time 12  Period Weeks  Status Achieved   OT LONG TERM GOAL  #9  TITLE Pt. will complete plant care with modified independence.  Baseline 09/13/22: continues to report intermittent difficulty 07/19/2022: Pt. Continues to be able to water, and care for some  of her plants. Pt. Has more difficulty with plants that are harder to reach. 07/05/2022: pt. Is able to water, and care for some  of her plants. Pt. Has more difficulty with plants that are harder to reach. 04/07/2022: Pt. is now able to hold a cup and water her plants.02/03/2022: Pt. has difficulty caring for her plants.   Time 12  Period Weeks  Status Ongoing   OT LONG TERM GOAL  #10  TITLE Pt. will demonstrate adaptive techniques to assist with the efficiency of self-dressing, or morning care tasks.  Baseline 09/13/22: MOD A dressing, reports she is often rushed 07/19/2022: Pt. continues to require assist with self-dressing/morning care tasks.   07/05/2022: Continue with goal.  04/07/2022: Pt. Continues to require assist with the efficiency of self-dressing, and morning care tasks.02/03/2022: Pt. requires assist from caregivers 2/2 time limitations during morning care.   Time 12  Period Weeks  Status Ongoing   Title Pt.  will improve BUE strength to improve ADL, and IADL functioning.  Baseline 09/13/22: shoulder flexion L 4/5, R 3/5; elbow flexion B 5/5, elbow extension 4-/5;  shoulder abduction L 4+/5, R 3+/5. 07/19/2022:  BUE strength continues to be limited. 07/05/2022: shoulder flexion: right 4-/5,  abduction: 3+/5, elbow flexion: right: 5/5, left 5/5, extension: right: 4-/5, left 4-/5, wrist extension: right: 3-/5, left: 2-/5  Time 12   Period Weeks   Status Ongoing     Plan - 07/22/21 1737     Clinical Impression Statement Measurements were obtained and goals were reviewed with the patient. Pt has made progress with R lateral pinch strength increasing to 6.5#. Pt achieved handwriting goal. Pt. continues to  tolerate there. Ex., and ROM well today.  Pt. continues to present with tightness at the bilateral thumb webspace, and bilateral wrist flexor tightness, as well as digit MP, PIP, and DIP extensor tightness which continues to limit her ability to complete ADL tasks efficiently. Pt. continues to benefit from working on impoving ROM in order to work towards increasing bilateral hand grasp on objects, and increase engagement of bilateral hands during ADLs, and IADL tasks.               OT Occupational Profile and History Detailed Assessment- Review of Records and additional review of physical, cognitive, psychosocial history related to current functional performance    Occupational performance deficits (Please refer to evaluation for details): ADL's;IADL's;Leisure;Social Participation    Body Structure / Function / Physical Skills ADL;Continence;Dexterity;Flexibility;Strength;ROM;Balance;Coordination;FMC;IADL;Endurance;UE functional use;Decreased knowledge of use of DME;[facility]    Psychosocial Skills Coping Strategies    Rehab Potential Fair    Clinical Decision Making Several treatment options, min-mod task modification necessary    Comorbidities Affecting Occupational Performance: Presence of comorbidities impacting occupational performance    Comorbidities impacting occupational performance description: contractures of bilateral hands, dependent transfers,    Modification or Assistance to Complete Evaluation  No modification of tasks or assist necessary to complete eval    OT Frequency 2x /  week    OT Duration 12 weeks    OT Treatment/Interventions Self-care/ADL training;Cryotherapy;Therapeutic exercise;DME and/or AE instruction;Balance training;Neuromuscular education;Manual Therapy;Splinting;Moist Heat;Passive range of motion;Therapeutic activities;Patient/family education    Plan continue to progress ROM of digits, wrists and shoulders as it pertains to completion of ADL tasks that pt values.    Consulted and Agree with Plan of Care Patient         Kathie Dike, M.S. OTR/L  09/13/22, 11:14 AM  ascom [phone]

## 2022-09-15 ENCOUNTER — Ambulatory Visit: Payer: Medicare HMO | Admitting: Occupational Therapy

## 2022-09-15 ENCOUNTER — Ambulatory Visit: Payer: Medicare HMO

## 2022-09-15 DIAGNOSIS — R269 Unspecified abnormalities of gait and mobility: Secondary | ICD-10-CM

## 2022-09-15 DIAGNOSIS — S72001A Fracture of unspecified part of neck of right femur, initial encounter for closed fracture: Secondary | ICD-10-CM

## 2022-09-15 DIAGNOSIS — R278 Other lack of coordination: Secondary | ICD-10-CM

## 2022-09-15 DIAGNOSIS — M6281 Muscle weakness (generalized): Secondary | ICD-10-CM

## 2022-09-15 DIAGNOSIS — R262 Difficulty in walking, not elsewhere classified: Secondary | ICD-10-CM

## 2022-09-15 DIAGNOSIS — S14129S Central cord syndrome at unspecified level of cervical spinal cord, sequela: Secondary | ICD-10-CM

## 2022-09-15 NOTE — Therapy (Signed)
OUTPATIENT PHYSICAL THERAPY NEURO TREATMENT   Patient Name: Sherry [name] MRN: [id number] DOB:[birth year], 88 y.o., female Today's Date: 09/16/2022   PCP: Earnestine Mealing, MD REFERRING PROVIDER: Deeann Saint   PT End of Session - 09/15/22 1149     Visit Number 64    Number of Visits 74    Date for PT Re-Evaluation 09/27/22    Progress Note Due on Visit 70    PT Start Time 1146    PT Stop Time 1225    PT Time Calculation (min) 39 min    Equipment Utilized During Treatment Gait belt    Activity Tolerance Patient tolerated treatment well;Patient limited by fatigue;No increased pain    Behavior During Therapy WFL for tasks assessed/performed              Past Medical History:  Diagnosis Date   Acute blood loss anemia    Arthritis    Cancer (HCC)    skin   Central cord syndrome at C6 level of cervical spinal cord (HCC) 11/29/2017   Hypertension    Protein-calorie malnutrition, severe (HCC) 01/24/2018   S/P insertion of IVC (inferior vena caval) filter 01/24/2018   Tetraparesis (HCC)    Past Surgical History:  Procedure Laterality Date   ANTERIOR CERVICAL DECOMP/DISCECTOMY FUSION N/A 11/29/2017   Procedure: Cervical five-six, six-seven Anterior Cervical Decompression Fusion;  Surgeon: Jadene Pierini, MD;  Location: [facility] OR;  Service: Neurosurgery;  Laterality: N/A;  Cervical five-six, six-seven Anterior Cervical Decompression Fusion   CATARACT EXTRACTION     FEMUR IM NAIL Left 08/16/2021   Procedure: INTRAMEDULLARY (IM) NAIL FEMORAL;  Surgeon: Deeann Saint, MD;  Location: [facility] ORS;  Service: Orthopedics;  Laterality: Left;   IR IVC FILTER PLMT / S&I /IMG GUID/MOD SED  12/08/2017   Patient Active Problem List   Diagnosis Date Noted   Age-related osteoporosis without current pathological fracture 07/27/2022   Mild recurrent major depression (HCC) 07/27/2022   Hypertensive heart disease with other congestive heart failure (HCC) 02/14/2022   Chronic indwelling  Foley catheter 02/14/2022   Closed hip fracture (HCC) 08/15/2021   DVT (deep venous thrombosis) (HCC) 08/15/2021   Quadriplegia (HCC) 08/15/2021   Fall 08/15/2021   Constipation due to slow transit 08/31/2018   Trauma 06/05/2018   Neuropathic pain 06/05/2018   Neurogenic bowel 06/05/2018   Vaginal yeast infection 01/30/2018   Healthcare-associated pneumonia 01/25/2018   Chronic allergic rhinitis 01/24/2018   Depression with anxiety 01/24/2018   UTI due to Klebsiella species 01/24/2018   Protein-calorie malnutrition, severe (HCC) 01/24/2018   S/P insertion of IVC (inferior vena caval) filter 01/24/2018   Tetraparesis (HCC) 01/20/2018   Neurogenic bladder 01/20/2018   Reactive depression    Benign essential HTN    Acute postoperative anemia due to expected blood loss    Central cord syndrome at C6 level of cervical spinal cord (HCC) 11/29/2017   Allergy to alpha-gal 11/25/2016   SCC (squamous cell carcinoma) 04/08/2014    ONSET DATE: 08/15/2021 (fall with B hip fx); Initial injury was in 2019- quadraplegia due to central cord syndrome of C6 secondary to neck fracture.   REFERRING DIAG: Bilateral Hip fx; Left Open- s/p Left IM nail femoral on 08/16/2021; Right closed  THERAPY DIAG:  Muscle weakness (generalized)  Other lack of coordination  Central cord syndrome, sequela (HCC)  Abnormality of gait and mobility  Difficulty in walking, not elsewhere classified  Closed fracture of both hips, initial encounter [facility])  Rationale for Evaluation and Treatment Rehabilitation  SUBJECTIVE:   SUBJECTIVE STATEMENT:  Patient reports still having some stomach issues and trying to be careful with what she eats. States not quite up for trying to stand today- worried about her stomach.     Pt accompanied by:  Paid caregiver-   PERTINENT HISTORY: Sherry [name] is an 87yoF who experienced a fall at home on 08/15/2021 with hip fracture, s/p Left hip ORIF. PMH: quadriplegia due to  central cord syndrome of C6 secondary to neck fracture, HTN, depression with anxiety, neurogenic bladder, bilateral DVT on Xarelto (s/p of IVC filter placement). Prior history of significant PT/OT with improvement of function. Pt has full use of shoulder/elbows, limited use of hands, adaptive self feeding sucessful.     PAIN:  None   OBJECTIVE:    TODAY'S TREATMENT:  THEREX:   Seated therex:   Resistive seated ankle DF- YTB 2 x 12 reps each LE Seated AAROM hip flex into resisted Hip ext using YTB - 2 sets of 12 reps each LE Seated knee ext with 3 sec hold 2 x 15 reps each LE. Seated hip abd/add with PT holding LE up for straight leg 2 sets of 15 reps.  Seated resistive heel slides 2 sets of 10 reps Reclined manually resisted leg press x 12 reps each LE     Pt educated throughout session about proper posture and technique with exercises. Improved exercise technique, movement at target joints, use of target muscles after min to mod verbal, visual, tactile cues.   PATIENT EDUCATION: Education details: PT plan of care/use of resistive bands Person educated: Patient Education method: Explanation, Demonstration, Tactile cues, and Verbal cues Education comprehension: verbalized understanding, returned demonstration, verbal cues required, tactile cues required, and needs further education   HOME EXERCISE PROGRAM: No changes at this time  Access Code: [email or web address] URL: [email or web address] Date: 11/23/2021 Prepared by: Precious Bard Exercises - Seated Gluteal Sets - 2 x daily - 7 x weekly - 2 sets - 10 reps - 5 hold - Seated Quad Set - 2 x daily - 7 x weekly - 2 sets - 10 reps - 5 hold - Seated Long Arc Quad - 2 x daily - 7 x weekly - 2 sets - 10 reps - 5 hold - Seated March - 2 x daily - 7 x weekly - 2 sets - 10 reps - 5 hold - Seated Hip Abduction - 2 x daily - 7 x weekly - 2 sets - 10 reps - 5 hold - Seated Shoulder Shrugs - 2 x daily - 7 x weekly - 2 sets - 10 reps - 5 hold -  Wheelchair Pressure Relief - 2 x daily - 7 x weekly - 2 sets - 10 reps - 5 hold  GOALS: Goals reviewed with patient? Yes  SHORT TERM GOALS: Target date: 02/22/2022  Pt will be independent with initial UE strengthening HEP in order to improve strength and balance in order to decrease fall risk and improve function at home and work. Baseline: 10/19/2021- patient with no formal UE HEP; 12/02/2021= Patient verbalized knowledge of HEP including use of theraband for UE strengthening.  Goal status: GOAL MET  LONG TERM GOALS: Target date: 09/27/2022  Pt will be independent with final for UE/LE HEP in order to improve strength and balance in order to decrease fall risk and improve function at home and work. Baseline: Patient is currently BLE NWB and unable to participate in HEP. Has order for UE strengthening. 01/11/2022- Patient now able to participate in LE  strengthening although NWB still- good understanding for some basic exercises. Will keep goal active to incorporate progressive LE strengthening exercises. 04/07/2022= Patient still participating in progressive LE seated HEP and has no questions at this time.  Goal status: MET  2.  Pt will improve FOTO to target score of 35  to display perceived improvements in ability to complete ADL's.  Baseline: 10/19/2021= 12; 12/02/2021= 17; 01/11/2022=17; 04/07/2022= 17; 07/05/2022=17 Goal status: Ongoing  3.  Pt will increase strength of B UE  by at least 1/2 MMT grade in order to demonstrate improvement in strength and function  Baseline: patient has range of 2-/5 to 4/5 BUE Strength; 12/02/2021= 4/5 except with wrist ext. 01/11/2022= 4/5 B UE strength expept for wrist Goal status: Goal revised-no longer appropriate- working with OT on all UE strengthening.   4. Pt. Will increase strength of RLE by at least 1/2 MMT grade in order to demonstrate improvement in Standing/transfers.  Baseline: 2-/5 Right hip flex/knee ext/flex; 04/07/2022= 2- with right hip flex/knee ext  (lacking 28 deg from zero) 4/8= Patient able to ext right knee lacking 18 deg from zero. 07/05/2022=Left knee approx 8 deg from zero and right knee = 23 deg from zero  Goal status: Progressing  5. Pt. Will demo ability to stand pivot transfer with max assist for improved functional mobility and less dependent need on mechanical device.   Baseline: Dependent on hoyer lift for all transfers; 04/07/2022- Unable to test secondary to patient with recent UTI and right hand procedure - will attempt to reassess next visit. ; 04/12/22: Unable to successfully stand due to feet plates on loaned power w/c; 05/10/2022- Patient still having to use rental chair and has not received her original power w/c back to practice SPT. 07/04/2021= Patient just received new power w/c and attempted standing from 1st time today- Patient able to stand with max assist today from w/c and did not attempt to perform pivot transfer- difficulty with static standing and placing weight on right LE.   Goal status: ONGOING  6. Pt. Will demonstrate improved functional LE strength as seen by ability to stand > 2 min for improved transfer ability and pregait abilities.   Baseline: not assessed today due to recent dx: UTI; 04/07/2022- Unable to test secondary to patient with recent UTI and right hand procedure - will attempt to reassess next visit; 04/12/22: Unable to successfully stand due to feet plates on loaned power w/c 05/10/2022- Patient still having to use rental chair and has not received her original power w/c back to practice Standing. 07/05/2022- Patient able to stand with max assist at trunk today for 2 min 3 sec. Will keep goal active to ensure consistency.   Goal status: ONGOING  ASSESSMENT:  CLINICAL IMPRESSION: Patient did not feel up to standing today but agreeable to continue to work on LE strengthening. She was able to progress with some resistive theraband exercises and responded well overall without significant difficulty and able to  complete reps.  Pt will continue to benefit from skilled PT services to optimize independence and reduced caregiver support.    OBJECTIVE IMPAIRMENTS Abnormal gait, decreased activity tolerance, decreased balance, decreased coordination, decreased endurance, decreased mobility, difficulty walking, decreased ROM, decreased strength, hypomobility, impaired flexibility, impaired UE functional use, postural dysfunction, and pain.   ACTIVITY LIMITATIONS carrying, lifting, bending, sitting, standing, squatting, sleeping, stairs, transfers, bed mobility, continence, bathing, toileting, dressing, self feeding, reach over head, hygiene/grooming, and caring for others  PARTICIPATION LIMITATIONS: meal prep, cleaning, laundry, medication  management, personal finances, interpersonal relationship, driving, shopping, community activity, and yard work  PERSONAL FACTORS Age, Time since onset of injury/illness/exacerbation, and 1-2 comorbidities: HTN, cervical Sx  are also affecting patient's functional outcome.   REHAB POTENTIAL: Good  CLINICAL DECISION MAKING: Evolving/moderate complexity  EVALUATION COMPLEXITY: Moderate  PLAN: PT FREQUENCY: 1-2x/week  PT DURATION: 12 weeks  PLANNED INTERVENTIONS: Therapeutic exercises, Therapeutic activity, Neuromuscular re-education, Balance training, Gait training, Patient/Family education, Self Care, Joint mobilization, DME instructions, Dry Needling, Electrical stimulation, Wheelchair mobility training, Spinal mobilization, Cryotherapy, Moist heat, and Manual therapy  PLAN FOR NEXT SESSION: Continue with weight bearing activities,  practice standing next visit back in // bars and LE strengthening as appropriate.   9:44 AM, 09/16/22 Louis Meckel, PT Physical Therapist - [facility]  Outpatient Physical Therapy- [facility] [phone]     09/16/2022, 9:44 AM

## 2022-09-15 NOTE — Therapy (Addendum)
Occupational Therapy Neuro Treatrment Note   Patient Name: Sherry [name] MRN: [id number] DOB:1934/03/01, 88 y.o., female Today's Date: 09/15/2022                                         PCP: Dr. Tillman Abide REFERRING PROVIDER: Dr. Tillman Abide   OT End of Session - 09/15/22 1201     Visit Number [id number]    Number of Visits 96    Date for OT Re-Evaluation 09/27/22    Authorization Time Period Progress report period starting 0812/2024    OT Start Time 1100    OT Stop Time 1145    OT Time Calculation (min) 45 min    Equipment Utilized During Treatment powered wheelchair    Activity Tolerance Patient tolerated treatment well    Behavior During Therapy WFL for tasks assessed/performed                  Past Medical History:  Diagnosis Date   Acute blood loss anemia    Arthritis    Cancer (HCC)    skin   Central cord syndrome at C6 level of cervical spinal cord (HCC) 11/29/2017   Hypertension    Protein-calorie malnutrition, severe (HCC) 01/24/2018   S/P insertion of IVC (inferior vena caval) filter 01/24/2018   Tetraparesis (HCC)    Past Surgical History:  Procedure Laterality Date   ANTERIOR CERVICAL DECOMP/DISCECTOMY FUSION N/A 11/29/2017   Procedure: Cervical five-six, six-seven Anterior Cervical Decompression Fusion;  Surgeon: Jadene Pierini, MD;  Location: [facility] OR;  Service: Neurosurgery;  Laterality: N/A;  Cervical five-six, six-seven Anterior Cervical Decompression Fusion   CATARACT EXTRACTION     FEMUR IM NAIL Left 08/16/2021   Procedure: INTRAMEDULLARY (IM) NAIL FEMORAL;  Surgeon: Deeann Saint, MD;  Location: [facility] ORS;  Service: Orthopedics;  Laterality: Left;   IR IVC FILTER PLMT / S&I /IMG GUID/MOD SED  12/08/2017   Patient Active Problem List   Diagnosis Date Noted   Age-related osteoporosis without current pathological fracture 07/27/2022   Mild recurrent major depression (HCC) 07/27/2022   Hypertensive heart disease with other congestive heart  failure (HCC) 02/14/2022   Chronic indwelling Foley catheter 02/14/2022   Closed hip fracture (HCC) 08/15/2021   DVT (deep venous thrombosis) (HCC) 08/15/2021   Quadriplegia (HCC) 08/15/2021   Fall 08/15/2021   Constipation due to slow transit 08/31/2018   Trauma 06/05/2018   Neuropathic pain 06/05/2018   Neurogenic bowel 06/05/2018   Vaginal yeast infection 01/30/2018   Healthcare-associated pneumonia 01/25/2018   Chronic allergic rhinitis 01/24/2018   Depression with anxiety 01/24/2018   UTI due to Klebsiella species 01/24/2018   Protein-calorie malnutrition, severe (HCC) 01/24/2018   S/P insertion of IVC (inferior vena caval) filter 01/24/2018   Tetraparesis (HCC) 01/20/2018   Neurogenic bladder 01/20/2018   Reactive depression    Benign essential HTN    Acute postoperative anemia due to expected blood loss    Central cord syndrome at C6 level of cervical spinal cord (HCC) 11/29/2017   Allergy to alpha-gal 11/25/2016   SCC (squamous cell carcinoma) 04/08/2014    REFERRING DIAG: Central Cord Syndrome at C6 of the Cervical Spinal Cord, Fall with Bilateral Closed Hip Fractures, with ORIF repair with Intramedullary Nailing of the Left Hip Fracture.  THERAPY DIAG:  Muscle weakness (generalized)  Other lack of coordination  Rationale for Evaluation and Treatment Rehabilitation  PERTINENT  HISTORY: Patient s/p fall November 29, 2017 resulting in diagnosis of central cord syndrome at C 6 level.  She has had therapy in multiple venues but with recent move to [facility], therapy staff recommended she seek outpatient therapy for her needs. Pt. Had a recent fall with Bilateral Closed Hip Fractures, with ORIF repair with Intramedullary Nailing of the Left Hip Fracture.   PRECAUTIONS: None  SUBJECTIVE:   Pt. reports doing well today.  PAIN:  Are you having pain? No  OBJECTIVE:    Today's Treatment:    Manual Therapy:   Pt. tolerated soft tissue massage to the volar, and dorsal  surface of each digit on the bilateral hands, and thenar eminence secondary to stiffness following moist heat modality. Soft tissue mobilization was performed for Carpal, and metacarpal spread stretches. Manual therapy was performed independent of, and in preparation for therapeutic Ex.   Therapeutic Ex.    Pt. tolerated PROM followed by AROM bilateral wrist extension, PROM for bilateral digit MP, PIP, and DIP flexion, and extension, thumb radial, and palmar abduction. Emphasis was placed on left wrist extension with moist heat modality 2/2 tightness. Pt. performed 2# dowel ex. For UE strengthening secondary to weakness. Bilateral shoulder flexion, and chest presses were performed. Pt. worked on the BUE for strengthening, and reciprocal motion using the UBE while seated for 8 min. with no resistance. Constant monitoring was provided.      PATIENT EDUCATION: RUE Distal ROM, and strength Person educated: Patient Education method: Explanation and Demonstration Education comprehension: verbalized understanding, returned demonstration, and needs further education   HOME EXERCISE PROGRAM Continue with ongoing HEPs  Measurements:  12/09/2021:  Shoulder flexion: R: 120(136), L: 138(150) Shoulder abduction: R: 108(120), L: 110(120) Elbow: R: 0-148, L: 0-146 Wrist flexion: R: 55(62), L: 78 Writ extension: R: 34(55), L: 3(4) RD: R: 12(20), L: 16(26) UD: R: 8(24), L: 6(18) Thumb radial abduction: R: 11(20), L: 8(20) Digit flexion to the [facility]: R:  2nd: 5cm(3.5cm), 3rd: 7.5 cm(5cm), 4th: 7cm(3cm), 5th: 6cm(5.5cm) L: 2nd: 8cm(8cm), 3rd: 8cm(8cm), 4th: 7.5cm(7cm), 5th: 7cm(7cm)  02/03/2022:   Shoulder flexion: R: 122(138), L: 148(154) Shoulder abduction: R: 109(125), L: 125(125 Elbow: R: 0-148, L: 0-146 Wrist flexion: R: 60(68), L: 79 Writ extension: R: 40(55), L: 3(10) RD: R: 14(24), L: 20(28) UD: R: 8(24), L: 6(18) Thumb radial abduction: R: 20(25), L: 8(20) Digit flexion to the [facility]: R:   2nd: 4.5cm(3cm), 3rd: 6.5cm(4.5cm), 4th: 6.5 cm(3cm), 5th: 4.5cm(5cm) L: 2nd: 8cm(8cm), 3rd: 8cm(7cm), 4th: 7.5cm(7cm), 5th: 6.5cm(6.5cm)   04/07/2022:   Shoulder flexion: R: 125(150), L: 160(160) Shoulder abduction: R: 109(125), L: 125(125 Elbow: R: 0-148, L: 0-146 Wrist flexion: R: 60(68), L: 79 Writ extension: R: 40(55), L: 5(18) RD: R: 14(24), L: 20(28) UD: R: 18(24), L: 8(18) Thumb radial abduction: R: 20(25), L: 8(20) Digit flexion to the [facility]: R:  2nd: 4.5 cm(4 cm), 3rd: 6.5cm(3cm), 4th: 7 cm (5cm), 5th: 5cm(5cm) L: 2nd: 8cm(8cm), 3rd: 8cm(7cm), 4th: 7.5cm(7cm), 5th: 7cm(7cm)   07/05/2022:   Shoulder flexion: R: 125(154), L: 160(160) Shoulder abduction: R: 95(130), L: 143(143) Elbow: R: 0-148, L: 0-146 Wrist flexion: R: 60(68), L: 79 Writ extension: R: 40(60), L: 0(12) RD: R: 10(24), L: 12(20) UD: R: 16(24), L: 8(16) Thumb radial abduction: R: 20(25), L: 8(20) Digit flexion to the [facility]: R:  2nd: 4 cm(4cm), 3rd: 6.5cm( 3.5cm), 4th: 7 cm (5cm), 5th: 6cm(5cm) L: 2nd: 8cm(8cm), 3rd: 8cm(7cm), 4th: 8cm(7cm), 5th: 7cm(7cm)  09/13/2022:   Shoulder flexion: R: 125(154), L: 160(160) Shoulder  abduction: R: 100(130), L: 143(143) Elbow: R: 0-150, L: 0-150 Wrist flexion: R: 55(68), L: 79 Writ extension: R: 45(60), L: 0(12) RD: R: 10(24), L: 12(20) UD: R: 16(24), L: 8(16) Thumb radial abduction: R: 20(25), L: 8(20) Digit flexion to the [facility]: R:  2nd: 4 cm(4cm), 3rd: 6cm( 5cm), 4th: 6 cm (4cm), 5th: 5cm(5cm) L: 2nd: 7cm(cm), 3rd: 7cm(6cm), 4th: 7cm(6cm), 5th: 6cm(6cm)      OT Long Term Goals - 07/06/21 1106                                                                                          Target Date:  09/27/2022      OT LONG TERM GOAL #1   Title Patient and caregiver will demonstrate understanding of home exercise program for ROM.    Baseline 12/09/2021: Pt. Reports the restorative ROM program has not resumed at the facility. 10/28/2021: Pt. Is attempting  exercises/stretches at home. The facility restorative aides have not resumed exercises. 09/14/2021:  Pt. Reports inconsistencies with restorative ROM at  her care home. Pt/caregiver education to be provided about ongoing ROM needs.    Time 12    Period Weeks    Status Deferred     OT LONG TERM GOAL #2   Title Pt.  will improve BUE strength to be able to sustain her UE's in elevation to efficiently, and effectively perform hair care, and self-grooming tasks.   Baseline 02/03/2022: Pt. has improved, and is now able to sustain her Bilateral UEs in elevation long enough to efficiently, and effectively perform haircare, and self-grooming tasks. 12/09/2021: right shoulder 4-/5, left shoulder 4/5. Pt. has improved to be able to reach up for hair care, and self-grooming tasks. 10/28/2021: Right shoulder 3+/5, Left shoulder 4-/5 09/14/2021:  BUE shoulder strength: 3+/5 for shoulder flexion, and abduction.   Time 12    Period Weeks    Status Achieved               OT LONG TERM GOAL #3   Title Patient will demonstrate improved composite finger flexion to be able to firmly hold  and use adaptive devices during ADLs, and IADLs.   Baseline 09/13/22: improved digit flexion to the [facility]. 07/05/2022: Pt. presents with digit MP, PIP, and DIP extensor tightness limiting her ability to securely grip objects in her bilateral hands.  04/07/2022: Pt. Has improved with right 2nd, and 3rd digit flexion towards the [facility]. Pt. Continues to have difficulty securely holding and applying deodorant.02/03/2022: Pt. Has improved with digit flexion, however continues to have difficulty securely holding and using deoderant. 12/09/2021: Bilateral hand/digit MP, PIP, and DIP extension tightness limits her ability achieve digit flexion to hold, and apply deodorant. 10/28/2021:  Pt. continues to have difficulty holding the deodorant. 01/12/2021: Pt. Presents with limited digit extension. Pt. Is able to initiate holding deodorant, however is unable to  hold it while using it.   Time 12    Period Weeks    Status Revised   Target Date                  OT LONG TERM GOAL #4  Title Pt. Will improve bilateral wrist extension in preparation for anticipating, and initiating reaching for objects at the table.    Baseline 09/13/22: R: 55(68), L: 79 07/19/2022: Bilateral wrist extension in limited. 07/05/2022: Right: 40(60) Left: 0(12) 04/07/2022: Rght: 40(55) Left: 5(18) 02/03/2022: Right: 34(55) Left: 3(4) 12/09/2021: Right  34(55)  Left  3(4) 10/28/2021:  Right 22(38), Left 0(15) 09/14/2021:  Right: 17(35), left 2(15)   Time 12   Period weeks   Status Ongoing   Target Date      OT LONG TERM GOAL #5   Title Pt. will write, and sign her name with 100% legibility, and modified independence.    Baseline 09/13/22: 100% legibility to sign name, intermittent tremors 07/19/2022:  Pt. continues to be able to consistently fill out daily meal menus, and puzzles. 6/03/204: Pt. Is consistently filling out  daily meal menus, and puzzles. 04/07/2022: Pt. Reports that she continues to present with shaky appearing writing. Pt.reports that she is writing more when filling out her menu, and complete puzzles 02/03/2022: Pt. Continues to report legibility appears "shaky" when attempting signing her name. Pt.reports that she is writing more when filling out her menu, and complete puzzles.12/09/2021: Pt. Reports legibility appears "shaky" when attempting signing her name. Pt.reports that she is writing more when filling out her menu. 10/28/2021: pt. is able to sign her name, however legibility continues to be inconsistent. Pt. Reports "shaky appearance". 09/14/2021: Pt. continues to fill out her daily menu and complete puzzles. Pt. Continues to present with limited writing legibility.   Time 12    Period Weeks    Status  Achieved         OT LONG TERM GOAL  #6   TITLE Pt. will turn pages in a book with modified independence.    Baseline 02/03/2022: Pt. Is able to effectively turn  pages of a book. 10/28/2021: Pt. Is able to consistently turn book pages. 09/14/2021: Pt. has difficulty turning pages.    Time 12    Period Weeks    Status Achieved       OT LONG TERM GOAL  #7  TITLE Pt. Will increase  bilateral lateral pinch strength by 2 lbs to be able to securely grasp items during ADLs, and IADL tasks.   Baseline 09/13/22: R 6.5# L 4# 07/19/2022: TBD 07/05/2022: TBD 07/2022: NT-Pinch meter out for calibration. 02/03/2022: Right: 6#, Left: 4# 12/09/2021:  Right: 6#, Left: 4#   Time 12  Period Weeks  Status Ongoing           OT LONG TERM GOAL  #8  TITLE Pt. Will button a sweater with large 1" buttons, and zip her jacket with modified independence.   Baseline 06/07/2022: Pt. is able to use a button hook to button a sweater. Pt. Reports that she does not wear button down clothing often.  04/07/2022: Pt. Has difficulty manipulating buttons, however was introduced to a buttonhook. Pt. Was able to hold, and use a buttonhook for buttons on a shirt. 02/03/2022: Pt. is unable to manipulate buttons on her sweater, or zippers on her jacket.  Time 12  Period Weeks  Status Achieved   OT LONG TERM GOAL  #9  TITLE Pt. will complete plant care with modified independence.  Baseline 09/13/22: continues to report intermittent difficulty 07/19/2022: Pt. Continues to be able to water, and care for some  of her plants. Pt. Has more difficulty with plants that are harder to reach. 07/05/2022: pt. Is able to water, and care  for some  of her plants. Pt. Has more difficulty with plants that are harder to reach. 04/07/2022: Pt. is now able to hold a cup and water her plants.02/03/2022: Pt. has difficulty caring for her plants.   Time 12  Period Weeks  Status Ongoing   OT LONG TERM GOAL  #10  TITLE Pt. will demonstrate adaptive techniques to assist with the efficiency of self-dressing, or morning care tasks.  Baseline 09/13/22: MOD A dressing, reports she is often rushed 07/19/2022: Pt. continues to  require assist with self-dressing/morning care tasks.   07/05/2022: Continue with goal.  04/07/2022: Pt. Continues to require assist with the efficiency of self-dressing, and morning care tasks.02/03/2022: Pt. requires assist from caregivers 2/2 time limitations during morning care.   Time 12  Period Weeks  Status Ongoing   Title Pt.  will improve BUE strength to improve ADL, and IADL functioning.  Baseline 09/13/22: shoulder flexion L 4/5, R 3/5; elbow flexion B 5/5, elbow extension 4-/5; shoulder abduction L 4+/5, R 3+/5. 07/19/2022:  BUE strength continues to be limited. 07/05/2022: shoulder flexion: right 4-/5, abduction: 3+/5, elbow flexion: right: 5/5, left 5/5, extension: right: 4-/5, left 4-/5, wrist extension: right: 3-/5, left: 2-/5  Time 12   Period Weeks   Status Ongoing     Plan - 07/22/21 1737     Clinical Impression Pt. reports that she is now able to fold her throw/blanket. Pt. reports also being able to reach out to spread, and cover the throw over her husband in his chair, while she is in her chair. Pt. continues to tolerate manual therapy, there. Ex., and ROM well today. Pt. was able to complete UE strengthening with the 2# dowel without difficulty today, and was able to reach higher into shoulder flexion. Pt. continues to present with tightness at the bilateral thumb webspace, and bilateral wrist flexor tightness, as well as digit MP, PIP, and DIP extensor tightness which continues to limit her ability to complete ADL tasks efficiently. Pt. continues to benefit from working on impoving ROM in order to work towards increasing bilateral hand grasp on objects, and increase engagement of bilateral hands during ADLs, and IADL tasks.       OT Occupational Profile and History Detailed Assessment- Review of Records and additional review of physical, cognitive, psychosocial history related to current functional performance    Occupational performance deficits (Please refer to evaluation for  details): ADL's;IADL's;Leisure;Social Participation    Body Structure / Function / Physical Skills ADL;Continence;Dexterity;Flexibility;Strength;ROM;Balance;Coordination;FMC;IADL;Endurance;UE functional use;Decreased knowledge of use of DME;[facility]    Psychosocial Skills Coping Strategies    Rehab Potential Fair    Clinical Decision Making Several treatment options, min-mod task modification necessary    Comorbidities Affecting Occupational Performance: Presence of comorbidities impacting occupational performance    Comorbidities impacting occupational performance description: contractures of bilateral hands, dependent transfers,    Modification or Assistance to Complete Evaluation  No modification of tasks or assist necessary to complete eval    OT Frequency 2x / week    OT Duration 12 weeks    OT Treatment/Interventions Self-care/ADL training;Cryotherapy;Therapeutic exercise;DME and/or AE instruction;Balance training;Neuromuscular education;Manual Therapy;Splinting;Moist Heat;Passive range of motion;Therapeutic activities;Patient/family education    Plan continue to progress ROM of digits, wrists and shoulders as it pertains to completion of ADL tasks that pt values.    Consulted and Agree with Plan of Care Patient         Olegario Messier, MS, OTR/L  09/15/22, 12:08 PM  ascom [phone]

## 2022-09-20 ENCOUNTER — Encounter: Payer: Self-pay | Admitting: Student

## 2022-09-20 ENCOUNTER — Ambulatory Visit: Payer: Medicare HMO | Admitting: Occupational Therapy

## 2022-09-20 ENCOUNTER — Ambulatory Visit: Payer: Medicare HMO

## 2022-09-20 DIAGNOSIS — R262 Difficulty in walking, not elsewhere classified: Secondary | ICD-10-CM

## 2022-09-20 DIAGNOSIS — R278 Other lack of coordination: Secondary | ICD-10-CM

## 2022-09-20 DIAGNOSIS — S14129S Central cord syndrome at unspecified level of cervical spinal cord, sequela: Secondary | ICD-10-CM

## 2022-09-20 DIAGNOSIS — M6281 Muscle weakness (generalized): Secondary | ICD-10-CM

## 2022-09-20 DIAGNOSIS — S72002A Fracture of unspecified part of neck of left femur, initial encounter for closed fracture: Secondary | ICD-10-CM

## 2022-09-20 DIAGNOSIS — R269 Unspecified abnormalities of gait and mobility: Secondary | ICD-10-CM

## 2022-09-20 DIAGNOSIS — R2681 Unsteadiness on feet: Secondary | ICD-10-CM

## 2022-09-20 NOTE — Therapy (Signed)
OUTPATIENT PHYSICAL THERAPY NEURO TREATMENT   Patient Name: Sherry [name] MRN: [id number] DOB:[birth year], 88 y.o., female Today's Date: 09/20/2022   PCP: Earnestine Mealing, MD REFERRING PROVIDER: Deeann Saint   PT End of Session - 09/20/22 1219     Visit Number 65    Number of Visits 74    Date for PT Re-Evaluation 09/27/22    Progress Note Due on Visit 70    PT Start Time 1145    PT Stop Time 1216    PT Time Calculation (min) 31 min    Equipment Utilized During Treatment Gait belt    Activity Tolerance Patient tolerated treatment well;Patient limited by fatigue;No increased pain    Behavior During Therapy WFL for tasks assessed/performed              Past Medical History:  Diagnosis Date   Acute blood loss anemia    Arthritis    Cancer (HCC)    skin   Central cord syndrome at C6 level of cervical spinal cord (HCC) 11/29/2017   Hypertension    Protein-calorie malnutrition, severe (HCC) 01/24/2018   S/P insertion of IVC (inferior vena caval) filter 01/24/2018   Tetraparesis (HCC)    Past Surgical History:  Procedure Laterality Date   ANTERIOR CERVICAL DECOMP/DISCECTOMY FUSION N/A 11/29/2017   Procedure: Cervical five-six, six-seven Anterior Cervical Decompression Fusion;  Surgeon: Jadene Pierini, MD;  Location: [facility] OR;  Service: Neurosurgery;  Laterality: N/A;  Cervical five-six, six-seven Anterior Cervical Decompression Fusion   CATARACT EXTRACTION     FEMUR IM NAIL Left 08/16/2021   Procedure: INTRAMEDULLARY (IM) NAIL FEMORAL;  Surgeon: Deeann Saint, MD;  Location: [facility] ORS;  Service: Orthopedics;  Laterality: Left;   IR IVC FILTER PLMT / S&I /IMG GUID/MOD SED  12/08/2017   Patient Active Problem List   Diagnosis Date Noted   Age-related osteoporosis without current pathological fracture 07/27/2022   Mild recurrent major depression (HCC) 07/27/2022   Hypertensive heart disease with other congestive heart failure (HCC) 02/14/2022   Chronic indwelling  Foley catheter 02/14/2022   Closed hip fracture (HCC) 08/15/2021   DVT (deep venous thrombosis) (HCC) 08/15/2021   Quadriplegia (HCC) 08/15/2021   Fall 08/15/2021   Constipation due to slow transit 08/31/2018   Trauma 06/05/2018   Neuropathic pain 06/05/2018   Neurogenic bowel 06/05/2018   Vaginal yeast infection 01/30/2018   Healthcare-associated pneumonia 01/25/2018   Chronic allergic rhinitis 01/24/2018   Depression with anxiety 01/24/2018   UTI due to Klebsiella species 01/24/2018   Protein-calorie malnutrition, severe (HCC) 01/24/2018   S/P insertion of IVC (inferior vena caval) filter 01/24/2018   Tetraparesis (HCC) 01/20/2018   Neurogenic bladder 01/20/2018   Reactive depression    Benign essential HTN    Acute postoperative anemia due to expected blood loss    Central cord syndrome at C6 level of cervical spinal cord (HCC) 11/29/2017   Allergy to alpha-gal 11/25/2016   SCC (squamous cell carcinoma) 04/08/2014    ONSET DATE: 08/15/2021 (fall with B hip fx); Initial injury was in 2019- quadraplegia due to central cord syndrome of C6 secondary to neck fracture.   REFERRING DIAG: Bilateral Hip fx; Left Open- s/p Left IM nail femoral on 08/16/2021; Right closed  THERAPY DIAG:  Muscle weakness (generalized)  Other lack of coordination  Central cord syndrome, sequela (HCC)  Abnormality of gait and mobility  Difficulty in walking, not elsewhere classified  Closed fracture of both hips, initial encounter (HCC)  Unsteadiness on feet  Rationale for  Evaluation and Treatment Rehabilitation  SUBJECTIVE:   SUBJECTIVE STATEMENT:  Patient reports feeling better overall and agreeable to try to stand today.     Pt accompanied by:  Paid caregiver-   PERTINENT HISTORY: Sherry [name] is an 87yoF who experienced a fall at home on 08/15/2021 with hip fracture, s/p Left hip ORIF. PMH: quadriplegia due to central cord syndrome of C6 secondary to neck fracture, HTN, depression  with anxiety, neurogenic bladder, bilateral DVT on Xarelto (s/p of IVC filter placement). Prior history of significant PT/OT with improvement of function. Pt has full use of shoulder/elbows, limited use of hands, adaptive self feeding sucessful.     PAIN:  None   OBJECTIVE:    TODAY'S TREATMENT:  09/20/22   THEREX:   Seated therex:   Seated knee ext  x 15 reps each LE. Abdominal crunch into lumbar ext x 15 reps Static standing in // bars x 4 trials ranging from 45 sec to 2 min focusing on gluteal strength - Patient able to stand more erect with VC and light pressure at glutes provided by her caregiver- Verline Lema  Pt educated throughout session about proper posture and technique with exercises. Improved exercise technique, movement at target joints, use of target muscles after min to mod verbal, visual, tactile cues.   PATIENT EDUCATION: Education details: PT plan of care/use of resistive bands Person educated: Patient Education method: Explanation, Demonstration, Tactile cues, and Verbal cues Education comprehension: verbalized understanding, returned demonstration, verbal cues required, tactile cues required, and needs further education   HOME EXERCISE PROGRAM: No changes at this time  Access Code: [facility] URL: [email or web address] Date: 11/23/2021 Prepared by: Precious Bard Exercises - Seated Gluteal Sets - 2 x daily - 7 x weekly - 2 sets - 10 reps - 5 hold - Seated Quad Set - 2 x daily - 7 x weekly - 2 sets - 10 reps - 5 hold - Seated Long Arc Quad - 2 x daily - 7 x weekly - 2 sets - 10 reps - 5 hold - Seated March - 2 x daily - 7 x weekly - 2 sets - 10 reps - 5 hold - Seated Hip Abduction - 2 x daily - 7 x weekly - 2 sets - 10 reps - 5 hold - Seated Shoulder Shrugs - 2 x daily - 7 x weekly - 2 sets - 10 reps - 5 hold - Wheelchair Pressure Relief - 2 x daily - 7 x weekly - 2 sets - 10 reps - 5 hold  GOALS: Goals reviewed with patient? Yes  SHORT TERM GOALS: Target  date: 02/22/2022  Pt will be independent with initial UE strengthening HEP in order to improve strength and balance in order to decrease fall risk and improve function at home and work. Baseline: 10/19/2021- patient with no formal UE HEP; 12/02/2021= Patient verbalized knowledge of HEP including use of theraband for UE strengthening.  Goal status: GOAL MET  LONG TERM GOALS: Target date: 09/27/2022  Pt will be independent with final for UE/LE HEP in order to improve strength and balance in order to decrease fall risk and improve function at home and work. Baseline: Patient is currently BLE NWB and unable to participate in HEP. Has order for UE strengthening. 01/11/2022- Patient now able to participate in LE strengthening although NWB still- good understanding for some basic exercises. Will keep goal active to incorporate progressive LE strengthening exercises. 04/07/2022= Patient still participating in progressive LE seated HEP and has no questions at this  time.  Goal status: MET  2.  Pt will improve FOTO to target score of 35  to display perceived improvements in ability to complete ADL's.  Baseline: 10/19/2021= 12; 12/02/2021= 17; 01/11/2022=17; 04/07/2022= 17; 07/05/2022=17 Goal status: Ongoing  3.  Pt will increase strength of B UE  by at least 1/2 MMT grade in order to demonstrate improvement in strength and function  Baseline: patient has range of 2-/5 to 4/5 BUE Strength; 12/02/2021= 4/5 except with wrist ext. 01/11/2022= 4/5 B UE strength expept for wrist Goal status: Goal revised-no longer appropriate- working with OT on all UE strengthening.   4. Pt. Will increase strength of RLE by at least 1/2 MMT grade in order to demonstrate improvement in Standing/transfers.  Baseline: 2-/5 Right hip flex/knee ext/flex; 04/07/2022= 2- with right hip flex/knee ext (lacking 28 deg from zero) 4/8= Patient able to ext right knee lacking 18 deg from zero. 07/05/2022=Left knee approx 8 deg from zero and right knee = 23  deg from zero  Goal status: Progressing  5. Pt. Will demo ability to stand pivot transfer with max assist for improved functional mobility and less dependent need on mechanical device.   Baseline: Dependent on hoyer lift for all transfers; 04/07/2022- Unable to test secondary to patient with recent UTI and right hand procedure - will attempt to reassess next visit. ; 04/12/22: Unable to successfully stand due to feet plates on loaned power w/c; 05/10/2022- Patient still having to use rental chair and has not received her original power w/c back to practice SPT. 07/04/2021= Patient just received new power w/c and attempted standing from 1st time today- Patient able to stand with max assist today from w/c and did not attempt to perform pivot transfer- difficulty with static standing and placing weight on right LE.   Goal status: ONGOING  6. Pt. Will demonstrate improved functional LE strength as seen by ability to stand > 2 min for improved transfer ability and pregait abilities.   Baseline: not assessed today due to recent dx: UTI; 04/07/2022- Unable to test secondary to patient with recent UTI and right hand procedure - will attempt to reassess next visit; 04/12/22: Unable to successfully stand due to feet plates on loaned power w/c 05/10/2022- Patient still having to use rental chair and has not received her original power w/c back to practice Standing. 07/05/2022- Patient able to stand with max assist at trunk today for 2 min 3 sec. Will keep goal active to ensure consistency.   Goal status: ONGOING  ASSESSMENT:  CLINICAL IMPRESSION: Patient able to resume some standing today with overall good results- Able to stand without pain or knee buckling- focusing on quad and gluteal activation. Patient had more trouble with left grip than standing in position today. She was limited by fatigue yet pleased with ability to stand since weakness from being sick with stomach issues past few weeks.  Pt will continue to benefit  from skilled PT services to optimize independence and reduced caregiver support.    OBJECTIVE IMPAIRMENTS Abnormal gait, decreased activity tolerance, decreased balance, decreased coordination, decreased endurance, decreased mobility, difficulty walking, decreased ROM, decreased strength, hypomobility, impaired flexibility, impaired UE functional use, postural dysfunction, and pain.   ACTIVITY LIMITATIONS carrying, lifting, bending, sitting, standing, squatting, sleeping, stairs, transfers, bed mobility, continence, bathing, toileting, dressing, self feeding, reach over head, hygiene/grooming, and caring for others  PARTICIPATION LIMITATIONS: meal prep, cleaning, laundry, medication management, personal finances, interpersonal relationship, driving, shopping, community activity, and yard work  PERSONAL FACTORS  Age, Time since onset of injury/illness/exacerbation, and 1-2 comorbidities: HTN, cervical Sx  are also affecting patient's functional outcome.   REHAB POTENTIAL: Good  CLINICAL DECISION MAKING: Evolving/moderate complexity  EVALUATION COMPLEXITY: Moderate  PLAN: PT FREQUENCY: 1-2x/week  PT DURATION: 12 weeks  PLANNED INTERVENTIONS: Therapeutic exercises, Therapeutic activity, Neuromuscular re-education, Balance training, Gait training, Patient/Family education, Self Care, Joint mobilization, DME instructions, Dry Needling, Electrical stimulation, Wheelchair mobility training, Spinal mobilization, Cryotherapy, Moist heat, and Manual therapy  PLAN FOR NEXT SESSION: Continue with weight bearing activities,  practice standing next visit back in // bars and LE strengthening as appropriate.   12:20 PM, 09/20/22 Louis Meckel, PT Physical Therapist - [facility]  Outpatient Physical Therapy- Main Campus [phone]     09/20/2022, 12:20 PM

## 2022-09-20 NOTE — Therapy (Signed)
Occupational Therapy Neuro Treatment/Recertication Note   Patient Name: Sherry [name] MRN: [id number] DOB:Jul 26, 1934, 88 y.o., female Today's Date: 09/20/2022                                 PCP: Dr. Tillman Abide REFERRING PROVIDER: Dr. Earnestine Mealing   OT End of Session - 09/20/22 1345     Visit Number [id number]    Date for OT Re-Evaluation 12/13/22    Authorization Time Period Progress report period starting 0812/2024    OT Start Time 1105    OT Stop Time 1145    OT Time Calculation (min) 40 min    Activity Tolerance Patient tolerated treatment well    Behavior During Therapy [facility] for tasks assessed/performed               Past Medical History:  Diagnosis Date   Acute blood loss anemia    Arthritis    Cancer (HCC)    skin   Central cord syndrome at C6 level of cervical spinal cord (HCC) 11/29/2017   Hypertension    Protein-calorie malnutrition, severe (HCC) 01/24/2018   S/P insertion of IVC (inferior vena caval) filter 01/24/2018   Tetraparesis (HCC)    Past Surgical History:  Procedure Laterality Date   ANTERIOR CERVICAL DECOMP/DISCECTOMY FUSION N/A 11/29/2017   Procedure: Cervical five-six, six-seven Anterior Cervical Decompression Fusion;  Surgeon: Jadene Pierini, MD;  Location: [facility] OR;  Service: Neurosurgery;  Laterality: N/A;  Cervical five-six, six-seven Anterior Cervical Decompression Fusion   CATARACT EXTRACTION     FEMUR IM NAIL Left 08/16/2021   Procedure: INTRAMEDULLARY (IM) NAIL FEMORAL;  Surgeon: Deeann Saint, MD;  Location: [facility] ORS;  Service: Orthopedics;  Laterality: Left;   IR IVC FILTER PLMT / S&I /IMG GUID/MOD SED  12/08/2017   Patient Active Problem List   Diagnosis Date Noted   Age-related osteoporosis without current pathological fracture 07/27/2022   Mild recurrent major depression (HCC) 07/27/2022   Hypertensive heart disease with other congestive heart failure (HCC) 02/14/2022   Chronic indwelling Foley catheter 02/14/2022   Closed  hip fracture (HCC) 08/15/2021   DVT (deep venous thrombosis) (HCC) 08/15/2021   Quadriplegia (HCC) 08/15/2021   Fall 08/15/2021   Constipation due to slow transit 08/31/2018   Trauma 06/05/2018   Neuropathic pain 06/05/2018   Neurogenic bowel 06/05/2018   Vaginal yeast infection 01/30/2018   Healthcare-associated pneumonia 01/25/2018   Chronic allergic rhinitis 01/24/2018   Depression with anxiety 01/24/2018   UTI due to Klebsiella species 01/24/2018   Protein-calorie malnutrition, severe (HCC) 01/24/2018   S/P insertion of IVC (inferior vena caval) filter 01/24/2018   Tetraparesis (HCC) 01/20/2018   Neurogenic bladder 01/20/2018   Reactive depression    Benign essential HTN    Acute postoperative anemia due to expected blood loss    Central cord syndrome at C6 level of cervical spinal cord (HCC) 11/29/2017   Allergy to alpha-gal 11/25/2016   SCC (squamous cell carcinoma) 04/08/2014    REFERRING DIAG: Central Cord Syndrome at C6 of the Cervical Spinal Cord, Fall with Bilateral Closed Hip Fractures, with ORIF repair with Intramedullary Nailing of the Left Hip Fracture.  THERAPY DIAG:   Muscle weakness (generalized)  Other lack of coordination  Rationale for Evaluation and Treatment Rehabilitation  PERTINENT HISTORY: Patient s/p fall November 29, 2017 resulting in diagnosis of central cord syndrome at C 6 level.  She has had therapy in multiple venues  but with recent move to [facility], therapy staff recommended she seek outpatient therapy for her needs. Pt. Had a recent fall with Bilateral Closed Hip Fractures, with ORIF repair with Intramedullary Nailing of the Left Hip Fracture.   PRECAUTIONS: None  SUBJECTIVE:   Pt. reports the it was a sad weekend at [facility].   PAIN: Are you having pain No  OBJECTIVE:    Today's Treatment:    Manual Therapy:   Pt. tolerated soft tissue massage to the volar, and dorsal surface of each digit on the bilateral hands, and thenar  eminence secondary to stiffness following moist heat modality. Soft tissue mobilization was performed for Carpal, and metacarpal spread stretches. Manual therapy was performed independent of, and in preparation for therapeutic Ex.   Therapeutic Ex.    Pt. tolerated PROM followed by AROM bilateral wrist extension, PROM for bilateral digit MP, PIP, and DIP flexion, and extension, thumb radial, and palmar abduction. Emphasis was placed on left wrist extension with moist heat modality 2/2 tightness. Pt. performed 2# dowel ex. For UE strengthening secondary to weakness. Bilateral shoulder flexion, and chest presses were performed. Pt. worked on the BUE for strengthening, and reciprocal motion using the UBE while seated for 8 min. with no resistance. Constant monitoring was provided.      PATIENT EDUCATION: RUE Distal ROM, and strength Person educated: Patient Education method: Explanation and Demonstration Education comprehension: verbalized understanding, returned demonstration, and needs further education   HOME EXERCISE PROGRAM Continue with ongoing HEPs  Measurements:  12/09/2021:  Shoulder flexion: R: 120(136), L: 138(150) Shoulder abduction: R: 108(120), L: 110(120) Elbow: R: 0-148, L: 0-146 Wrist flexion: R: 55(62), L: 78 Writ extension: R: 34(55), L: 3(4) RD: R: 12(20), L: 16(26) UD: R: 8(24), L: 6(18) Thumb radial abduction: R: 11(20), L: 8(20) Digit flexion to the [facility]: R:  2nd: 5cm(3.5cm), 3rd: 7.5 cm(5cm), 4th: 7cm(3cm), 5th: 6cm(5.5cm) L: 2nd: 8cm(8cm), 3rd: 8cm(8cm), 4th: 7.5cm(7cm), 5th: 7cm(7cm)  02/03/2022:   Shoulder flexion: R: 122(138), L: 148(154) Shoulder abduction: R: 109(125), L: 125(125 Elbow: R: 0-148, L: 0-146 Wrist flexion: R: 60(68), L: 79 Writ extension: R: 40(55), L: 3(10) RD: R: 14(24), L: 20(28) UD: R: 8(24), L: 6(18) Thumb radial abduction: R: 20(25), L: 8(20) Digit flexion to the [facility]: R:  2nd: 4.5cm(3cm), 3rd: 6.5cm(4.5cm), 4th: 6.5 cm(3cm),  5th: 4.5cm(5cm) L: 2nd: 8cm(8cm), 3rd: 8cm(7cm), 4th: 7.5cm(7cm), 5th: 6.5cm(6.5cm)   04/07/2022:   Shoulder flexion: R: 125(150), L: 160(160) Shoulder abduction: R: 109(125), L: 125(125 Elbow: R: 0-148, L: 0-146 Wrist flexion: R: 60(68), L: 79 Writ extension: R: 40(55), L: 5(18) RD: R: 14(24), L: 20(28) UD: R: 18(24), L: 8(18) Thumb radial abduction: R: 20(25), L: 8(20) Digit flexion to the [facility]: R:  2nd: 4.5 cm(4 cm), 3rd: 6.5cm(3cm), 4th: 7 cm (5cm), 5th: 5cm(5cm) L: 2nd: 8cm(8cm), 3rd: 8cm(7cm), 4th: 7.5cm(7cm), 5th: 7cm(7cm)   07/05/2022:   Shoulder flexion: R: 125(154), L: 160(160) Shoulder abduction: R: 95(130), L: 143(143) Elbow: R: 0-148, L: 0-146 Wrist flexion: R: 60(68), L: 79 Writ extension: R: 40(60), L: 0(12) RD: R: 10(24), L: 12(20) UD: R: 16(24), L: 8(16) Thumb radial abduction: R: 20(25), L: 8(20) Digit flexion to the [facility]: R:  2nd: 4 cm(4cm), 3rd: 6.5cm( 3.5cm), 4th: 7 cm (5cm), 5th: 6cm(5cm) L: 2nd: 8cm(8cm), 3rd: 8cm(7cm), 4th: 8cm(7cm), 5th: 7cm(7cm)  09/13/2022:   Shoulder flexion: R: 125(154), L: 160(160) Shoulder abduction: R: 100(130), L: 143(143) Elbow: R: 0-150, L: 0-150 Wrist flexion: R: 55(68), L: 79 Writ extension: R: 45(60),  L: 0(12) RD: R: 10(24), L: 12(20) UD: R: 16(24), L: 8(16) Thumb radial abduction: R: 20(25), L: 8(20) Digit flexion to the [facility]: R:  2nd: 4 cm(4cm), 3rd: 6cm( 5cm), 4th: 6 cm (4cm), 5th: 5cm(5cm) L: 2nd: 7cm(cm), 3rd: 7cm(6cm), 4th: 7cm(6cm), 5th: 6cm(6cm)      OT Long Term Goals - 07/06/21 1106                                                                                          Target Date: 12/13/2022      OT LONG TERM GOAL #1   Title Patient and caregiver will demonstrate understanding of home exercise program for ROM.    Baseline 12/09/2021: Pt. Reports the restorative ROM program has not resumed at the facility. 10/28/2021: Pt. Is attempting exercises/stretches at home. The facility restorative aides have not  resumed exercises. 09/14/2021:  Pt. Reports inconsistencies with restorative ROM at  her care home. Pt/caregiver education to be provided about ongoing ROM needs.    Time 12    Period Weeks    Status Deferred     OT LONG TERM GOAL #2   Title Pt.  will improve BUE strength to be able to sustain her UE's in elevation to efficiently, and effectively perform hair care, and self-grooming tasks.   Baseline 02/03/2022: Pt. has improved, and is now able to sustain her Bilateral UEs in elevation long enough to efficiently, and effectively perform haircare, and self-grooming tasks. 12/09/2021: right shoulder 4-/5, left shoulder 4/5. Pt. has improved to be able to reach up for hair care, and self-grooming tasks. 10/28/2021: Right shoulder 3+/5, Left shoulder 4-/5 09/14/2021:  BUE shoulder strength: 3+/5 for shoulder flexion, and abduction.   Time 12    Period Weeks    Status Achieved               OT LONG TERM GOAL #3   Title Patient will demonstrate improved composite finger flexion to be able to firmly hold  and use adaptive devices during ADLs, and IADLs.   Baseline 09/20/2022: Improving digit flexion to the [facility]. 09/13/22: improved digit flexion to the [facility]. 07/05/2022: Pt. presents with digit MP, PIP, and DIP extensor tightness limiting her ability to securely grip objects in her bilateral hands.  04/07/2022: Pt. Has improved with right 2nd, and 3rd digit flexion towards the [facility]. Pt. Continues to have difficulty securely holding and applying deodorant.02/03/2022: Pt. Has improved with digit flexion, however continues to have difficulty securely holding and using deoderant. 12/09/2021: Bilateral hand/digit MP, PIP, and DIP extension tightness limits her ability achieve digit flexion to hold, and apply deodorant. 10/28/2021:  Pt. continues to have difficulty holding the deodorant. 01/12/2021: Pt. Presents with limited digit extension. Pt. Is able to initiate holding deodorant, however is unable to hold it while using it.    Time 12    Period Weeks    Status Revised   Target Date                  OT LONG TERM GOAL #4   Title Pt. Will improve bilateral wrist extension in preparation for anticipating, and initiating reaching  for objects at the table.    Baseline 09/20/2022: limited PROM in the Left wrist extension 2/2 tightness/stiffness. 09/13/22: R: 55(68), L: 79 07/19/2022: Bilateral wrist extension in limited. 07/05/2022: Right: 40(60) Left: 0(12) 04/07/2022: Rght: 40(55) Left: 5(18) 02/03/2022: Right: 34(55) Left: 3(4) 12/09/2021: Right  34(55)  Left  3(4) 10/28/2021:  Right 22(38), Left 0(15) 09/14/2021:  Right: 17(35), left 2(15)   Time 12   Period weeks   Status Ongoing   Target Date      OT LONG TERM GOAL #5   Title Pt. will write, and sign her name with 100% legibility, and modified independence.    Baseline 09/13/22: 100% legibility to sign name, intermittent tremors 07/19/2022:  Pt. continues to be able to consistently fill out daily meal menus, and puzzles. 6/03/204: Pt. Is consistently filling out  daily meal menus, and puzzles. 04/07/2022: Pt. Reports that she continues to present with shaky appearing writing. Pt.reports that she is writing more when filling out her menu, and complete puzzles 02/03/2022: Pt. Continues to report legibility appears "shaky" when attempting signing her name. Pt.reports that she is writing more when filling out her menu, and complete puzzles.12/09/2021: Pt. Reports legibility appears "shaky" when attempting signing her name. Pt.reports that she is writing more when filling out her menu. 10/28/2021: pt. is able to sign her name, however legibility continues to be inconsistent. Pt. Reports "shaky appearance". 09/14/2021: Pt. continues to fill out her daily menu and complete puzzles. Pt. Continues to present with limited writing legibility.   Time 12    Period Weeks    Status  Achieved         OT LONG TERM GOAL  #6   TITLE Pt. will turn pages in a book with modified independence.     Baseline 02/03/2022: Pt. Is able to effectively turn pages of a book. 10/28/2021: Pt. Is able to consistently turn book pages. 09/14/2021: Pt. has difficulty turning pages.    Time 12    Period Weeks    Status Achieved       OT LONG TERM GOAL  #7  TITLE Pt. Will increase  bilateral lateral pinch strength by 2 lbs to be able to securely grasp items during ADLs, and IADL tasks.   Baseline 09/20/2022: lateral pinch continues to be limited 09/13/22: R 6.5# L 4# 07/19/2022: TBD 07/05/2022: TBD 07/2022: NT-Pinch meter out for calibration. 02/03/2022: Right: 6#, Left: 4# 12/09/2021:  Right: 6#, Left: 4#   Time 12  Period Weeks  Status Ongoing           OT LONG TERM GOAL  #8  TITLE Pt. Will button a sweater with large 1" buttons, and zip her jacket with modified independence.   Baseline 06/07/2022: Pt. is able to use a button hook to button a sweater. Pt. Reports that she does not wear button down clothing often.  04/07/2022: Pt. Has difficulty manipulating buttons, however was introduced to a buttonhook. Pt. Was able to hold, and use a buttonhook for buttons on a shirt. 02/03/2022: Pt. is unable to manipulate buttons on her sweater, or zippers on her jacket.  Time 12  Period Weeks  Status Achieved   OT LONG TERM GOAL  #9  TITLE Pt. will complete plant care with modified independence.  Baseline 09/20/2022: Pt. Continues to have difficulty reaching for thorough plant care. 09/13/22: continues to report intermittent difficulty 07/19/2022: Pt. Continues to be able to water, and care for some  of her plants. Pt. Has more difficulty with  plants that are harder to reach. 07/05/2022: pt. Is able to water, and care for some  of her plants. Pt. Has more difficulty with plants that are harder to reach. 04/07/2022: Pt. is now able to hold a cup and water her plants.02/03/2022: Pt. has difficulty caring for her plants.   Time 12  Period Weeks  Status Ongoing   OT LONG TERM GOAL  #10  TITLE Pt. will demonstrate adaptive  techniques to assist with the efficiency of self-dressing, or morning care tasks.  Baseline 09/20/2022: Pt. continues to require assist form personal, and staff care aides. 09/13/22: MOD A dressing, reports she is often rushed 07/19/2022: Pt. continues to require assist with self-dressing/morning care tasks.   07/05/2022: Continue with goal.  04/07/2022: Pt. Continues to require assist with the efficiency of self-dressing, and morning care tasks.02/03/2022: Pt. requires assist from caregivers 2/2 time limitations during morning care.   Time 12  Period Weeks  Status Ongoing  OT LONG TERM GOAL  #11 Title Pt.  will improve BUE strength to improve ADL, and IADL functioning.  Baseline 09/20/2022: Pt. Presents with limited BUE strength 09/13/22: shoulder flexion L 4/5, R 3/5; elbow flexion B 5/5, elbow extension 4-/5; shoulder abduction L 4+/5, R 3+/5. 07/19/2022:  BUE strength continues to be limited. 07/05/2022: shoulder flexion: right 4-/5, abduction: 3+/5, elbow flexion: right: 5/5, left 5/5, extension: right: 4-/5, left 4-/5, wrist extension: right: 3-/5, left: 2-/5  Time 12   Period Weeks   Status Ongoing     Plan - 07/22/21 1737     Clinical Impression Pt. is now consistently engaging her bilateral UEs during daily tasks more. Pt. Continues to be able to fold her throw/blanket, and is able to reach out to spread, and cover the throw over her husband in his chair, while she is in her chair. Pt. continues to tolerate manual therapy, there. Ex., and ROM. Pt. continues to present with tightness at the bilateral thumb webspace, and bilateral wrist flexor tightness, as well as digit MP, PIP, and DIP extensor tightness which continues to limit her ability to complete ADL tasks efficiently.  Goals were reviewed. Plan to continue with current treatment POC, and goals, as Pt. Is making consistent progress, and has progressed during the recent 10th visit progress update.  Pt. continues to benefit from working on impoving  ROM in order to work towards increasing bilateral hand grasp on objects, and increase engagement of bilateral hands during ADLs, and IADL tasks.       OT Occupational Profile and History Detailed Assessment- Review of Records and additional review of physical, cognitive, psychosocial history related to current functional performance    Occupational performance deficits (Please refer to evaluation for details): ADL's;IADL's;Leisure;Social Participation    Body Structure / Function / Physical Skills ADL;Continence;Dexterity;Flexibility;Strength;ROM;Balance;Coordination;FMC;IADL;Endurance;UE functional use;Decreased knowledge of use of DME;[facility]    Psychosocial Skills Coping Strategies    Rehab Potential Fair    Clinical Decision Making Several treatment options, min-mod task modification necessary    Comorbidities Affecting Occupational Performance: Presence of comorbidities impacting occupational performance    Comorbidities impacting occupational performance description: contractures of bilateral hands, dependent transfers,    Modification or Assistance to Complete Evaluation  No modification of tasks or assist necessary to complete eval    OT Frequency 2x / week    OT Duration 12 weeks    OT Treatment/Interventions Self-care/ADL training;Cryotherapy;Therapeutic exercise;DME and/or AE instruction;Balance training;Neuromuscular education;Manual Therapy;Splinting;Moist Heat;Passive range of motion;Therapeutic activities;Patient/family education    Plan continue to progress  ROM of digits, wrists and shoulders as it pertains to completion of ADL tasks that pt values.    Consulted and Agree with Plan of Care Patient         Olegario Messier, MS, OTR/L  09/20/22, 3:12 PM

## 2022-09-20 NOTE — Telephone Encounter (Signed)
Message routed to PCP Beamer, Victoria, MD  

## 2022-09-22 ENCOUNTER — Ambulatory Visit: Payer: Medicare HMO

## 2022-09-22 ENCOUNTER — Ambulatory Visit: Payer: Medicare HMO | Admitting: Occupational Therapy

## 2022-09-22 DIAGNOSIS — M6281 Muscle weakness (generalized): Secondary | ICD-10-CM

## 2022-09-22 DIAGNOSIS — S72001A Fracture of unspecified part of neck of right femur, initial encounter for closed fracture: Secondary | ICD-10-CM

## 2022-09-22 DIAGNOSIS — R262 Difficulty in walking, not elsewhere classified: Secondary | ICD-10-CM

## 2022-09-22 DIAGNOSIS — R278 Other lack of coordination: Secondary | ICD-10-CM

## 2022-09-22 DIAGNOSIS — R269 Unspecified abnormalities of gait and mobility: Secondary | ICD-10-CM

## 2022-09-22 DIAGNOSIS — R2681 Unsteadiness on feet: Secondary | ICD-10-CM

## 2022-09-22 DIAGNOSIS — S14129S Central cord syndrome at unspecified level of cervical spinal cord, sequela: Secondary | ICD-10-CM

## 2022-09-22 NOTE — Therapy (Signed)
OUTPATIENT PHYSICAL THERAPY NEURO TREATMENT   Patient Name: Sherry [name] MRN: [id number] DOB:06-07-1934, 88 y.o., female Today's Date: 09/22/2022   PCP: Earnestine Mealing, MD REFERRING PROVIDER: Deeann Saint   PT End of Session - 09/22/22 1306     Visit Number 66    Number of Visits 74    Date for PT Re-Evaluation 09/27/22    Progress Note Due on Visit 70    PT Start Time 1145    PT Stop Time 1224    PT Time Calculation (min) 39 min    Equipment Utilized During Treatment Gait belt    Activity Tolerance Patient tolerated treatment well;Patient limited by fatigue;No increased pain    Behavior During Therapy WFL for tasks assessed/performed              Past Medical History:  Diagnosis Date   Acute blood loss anemia    Arthritis    Cancer (HCC)    skin   Central cord syndrome at C6 level of cervical spinal cord (HCC) 11/29/2017   Hypertension    Protein-calorie malnutrition, severe (HCC) 01/24/2018   S/P insertion of IVC (inferior vena caval) filter 01/24/2018   Tetraparesis (HCC)    Past Surgical History:  Procedure Laterality Date   ANTERIOR CERVICAL DECOMP/DISCECTOMY FUSION N/A 11/29/2017   Procedure: Cervical five-six, six-seven Anterior Cervical Decompression Fusion;  Surgeon: Jadene Pierini, MD;  Location: [facility] OR;  Service: Neurosurgery;  Laterality: N/A;  Cervical five-six, six-seven Anterior Cervical Decompression Fusion   CATARACT EXTRACTION     FEMUR IM NAIL Left 08/16/2021   Procedure: INTRAMEDULLARY (IM) NAIL FEMORAL;  Surgeon: Deeann Saint, MD;  Location: [facility] ORS;  Service: Orthopedics;  Laterality: Left;   IR IVC FILTER PLMT / S&I /IMG GUID/MOD SED  12/08/2017   Patient Active Problem List   Diagnosis Date Noted   Age-related osteoporosis without current pathological fracture 07/27/2022   Mild recurrent major depression (HCC) 07/27/2022   Hypertensive heart disease with other congestive heart failure (HCC) 02/14/2022   Chronic indwelling  Foley catheter 02/14/2022   Closed hip fracture (HCC) 08/15/2021   DVT (deep venous thrombosis) (HCC) 08/15/2021   Quadriplegia (HCC) 08/15/2021   Fall 08/15/2021   Constipation due to slow transit 08/31/2018   Trauma 06/05/2018   Neuropathic pain 06/05/2018   Neurogenic bowel 06/05/2018   Vaginal yeast infection 01/30/2018   Healthcare-associated pneumonia 01/25/2018   Chronic allergic rhinitis 01/24/2018   Depression with anxiety 01/24/2018   UTI due to Klebsiella species 01/24/2018   Protein-calorie malnutrition, severe (HCC) 01/24/2018   S/P insertion of IVC (inferior vena caval) filter 01/24/2018   Tetraparesis (HCC) 01/20/2018   Neurogenic bladder 01/20/2018   Reactive depression    Benign essential HTN    Acute postoperative anemia due to expected blood loss    Central cord syndrome at C6 level of cervical spinal cord (HCC) 11/29/2017   Allergy to alpha-gal 11/25/2016   SCC (squamous cell carcinoma) 04/08/2014    ONSET DATE: 08/15/2021 (fall with B hip fx); Initial injury was in 2019- quadraplegia due to central cord syndrome of C6 secondary to neck fracture.   REFERRING DIAG: Bilateral Hip fx; Left Open- s/p Left IM nail femoral on 08/16/2021; Right closed  THERAPY DIAG:  Muscle weakness (generalized)  Other lack of coordination  Central cord syndrome, sequela (HCC)  Abnormality of gait and mobility  Difficulty in walking, not elsewhere classified  Closed fracture of both hips, initial encounter (HCC)  Unsteadiness on feet  Rationale for  Evaluation and Treatment Rehabilitation  SUBJECTIVE:   SUBJECTIVE STATEMENT:  Patient reports feeling better overall and agreeable to try to stand today.     Pt accompanied by:  Paid caregiver-   PERTINENT HISTORY: [name] is an 87yoF who experienced a fall at home on 08/15/2021 with hip fracture, s/p Left hip ORIF. PMH: quadriplegia due to central cord syndrome of C6 secondary to neck fracture, HTN, depression  with anxiety, neurogenic bladder, bilateral DVT on Xarelto (s/p of IVC filter placement). Prior history of significant PT/OT with improvement of function. Pt has full use of shoulder/elbows, limited use of hands, adaptive self feeding sucessful.     PAIN:  None   OBJECTIVE:    TODAY'S TREATMENT:  09/22/22    THERAPEUTIC ACTIVITIES:   - Dependent hoyer transfer from power chair to edge of mat   THEREX:   Seated therex at edge of mat without back support:   Seated knee ext  x 15 reps each LE. Abdominal crunch into lumbar ext x 15 reps Lateral trunk shift x 15 reps Overhead diagonal reaching x 15 reps  Seated hip flex 1.5# on right and 2# on left 2 sets of 12 reps Seated knee ext  1.5 # on right and 2# on left - 2 sets of 12 reps.  Seated hip abd BLE x 15 reps.   Pt educated throughout session about proper posture and technique with exercises. Improved exercise technique, movement at target joints, use of target muscles after min to mod verbal, visual, tactile cues.   PATIENT EDUCATION: Education details: PT plan of care/use of resistive bands Person educated: Patient Education method: Explanation, Demonstration, Tactile cues, and Verbal cues Education comprehension: verbalized understanding, returned demonstration, verbal cues required, tactile cues required, and needs further education   HOME EXERCISE PROGRAM: No changes at this time  Access Code: [facility] URL: [email or web address] Date: 11/23/2021 Prepared by: Precious Bard Exercises - Seated Gluteal Sets - 2 x daily - 7 x weekly - 2 sets - 10 reps - 5 hold - Seated Quad Set - 2 x daily - 7 x weekly - 2 sets - 10 reps - 5 hold - Seated Long Arc Quad - 2 x daily - 7 x weekly - 2 sets - 10 reps - 5 hold - Seated March - 2 x daily - 7 x weekly - 2 sets - 10 reps - 5 hold - Seated Hip Abduction - 2 x daily - 7 x weekly - 2 sets - 10 reps - 5 hold - Seated Shoulder Shrugs - 2 x daily - 7 x weekly - 2 sets - 10 reps  - 5 hold - Wheelchair Pressure Relief - 2 x daily - 7 x weekly - 2 sets - 10 reps - 5 hold  GOALS: Goals reviewed with patient? Yes  SHORT TERM GOALS: Target date: 02/22/2022  Pt will be independent with initial UE strengthening HEP in order to improve strength and balance in order to decrease fall risk and improve function at home and work. Baseline: 10/19/2021- patient with no formal UE HEP; 12/02/2021= Patient verbalized knowledge of HEP including use of theraband for UE strengthening.  Goal status: GOAL MET  LONG TERM GOALS: Target date: 09/27/2022  Pt will be independent with final for UE/LE HEP in order to improve strength and balance in order to decrease fall risk and improve function at home and work. Baseline: Patient is currently BLE NWB and unable to participate in HEP. Has order for UE strengthening. 01/11/2022-  Patient now able to participate in LE strengthening although NWB still- good understanding for some basic exercises. Will keep goal active to incorporate progressive LE strengthening exercises. 04/07/2022= Patient still participating in progressive LE seated HEP and has no questions at this time.  Goal status: MET  2.  Pt will improve FOTO to target score of 35  to display perceived improvements in ability to complete ADL's.  Baseline: 10/19/2021= 12; 12/02/2021= 17; 01/11/2022=17; 04/07/2022= 17; 07/05/2022=17 Goal status: Ongoing  3.  Pt will increase strength of B UE  by at least 1/2 MMT grade in order to demonstrate improvement in strength and function  Baseline: patient has range of 2-/5 to 4/5 BUE Strength; 12/02/2021= 4/5 except with wrist ext. 01/11/2022= 4/5 B UE strength expept for wrist Goal status: Goal revised-no longer appropriate- working with OT on all UE strengthening.   4. Pt. Will increase strength of RLE by at least 1/2 MMT grade in order to demonstrate improvement in Standing/transfers.  Baseline: 2-/5 Right hip flex/knee ext/flex; 04/07/2022= 2- with right hip  flex/knee ext (lacking 28 deg from zero) 4/8= Patient able to ext right knee lacking 18 deg from zero. 07/05/2022=Left knee approx 8 deg from zero and right knee = 23 deg from zero  Goal status: Progressing  5. Pt. Will demo ability to stand pivot transfer with max assist for improved functional mobility and less dependent need on mechanical device.   Baseline: Dependent on hoyer lift for all transfers; 04/07/2022- Unable to test secondary to patient with recent UTI and right hand procedure - will attempt to reassess next visit. ; 04/12/22: Unable to successfully stand due to feet plates on loaned power w/c; 05/10/2022- Patient still having to use rental chair and has not received her original power w/c back to practice SPT. 07/04/2021= Patient just received new power w/c and attempted standing from 1st time today- Patient able to stand with max assist today from w/c and did not attempt to perform pivot transfer- difficulty with static standing and placing weight on right LE.   Goal status: ONGOING  6. Pt. Will demonstrate improved functional LE strength as seen by ability to stand > 2 min for improved transfer ability and pregait abilities.   Baseline: not assessed today due to recent dx: UTI; 04/07/2022- Unable to test secondary to patient with recent UTI and right hand procedure - will attempt to reassess next visit; 04/12/22: Unable to successfully stand due to feet plates on loaned power w/c 05/10/2022- Patient still having to use rental chair and has not received her original power w/c back to practice Standing. 07/05/2022- Patient able to stand with max assist at trunk today for 2 min 3 sec. Will keep goal active to ensure consistency.   Goal status: ONGOING  ASSESSMENT:  CLINICAL IMPRESSION: Patient able to perform static and dynamic sitting at [facility] today without significant difficulty. She did have some difficulty with flexing left knee to position left LE onto floor but improved with practice. She was able to  perform all therex today without and LOB (unsupported trunk).  Pt will continue to benefit from skilled PT services to optimize independence and reduced caregiver support.    OBJECTIVE IMPAIRMENTS Abnormal gait, decreased activity tolerance, decreased balance, decreased coordination, decreased endurance, decreased mobility, difficulty walking, decreased ROM, decreased strength, hypomobility, impaired flexibility, impaired UE functional use, postural dysfunction, and pain.   ACTIVITY LIMITATIONS carrying, lifting, bending, sitting, standing, squatting, sleeping, stairs, transfers, bed mobility, continence, bathing, toileting, dressing, self feeding, reach over  head, hygiene/grooming, and caring for others  PARTICIPATION LIMITATIONS: meal prep, cleaning, laundry, medication management, personal finances, interpersonal relationship, driving, shopping, community activity, and yard work  PERSONAL FACTORS Age, Time since onset of injury/illness/exacerbation, and 1-2 comorbidities: HTN, cervical Sx  are also affecting patient's functional outcome.   REHAB POTENTIAL: Good  CLINICAL DECISION MAKING: Evolving/moderate complexity  EVALUATION COMPLEXITY: Moderate  PLAN: PT FREQUENCY: 1-2x/week  PT DURATION: 12 weeks  PLANNED INTERVENTIONS: Therapeutic exercises, Therapeutic activity, Neuromuscular re-education, Balance training, Gait training, Patient/Family education, Self Care, Joint mobilization, DME instructions, Dry Needling, Electrical stimulation, Wheelchair mobility training, Spinal mobilization, Cryotherapy, Moist heat, and Manual therapy  PLAN FOR NEXT SESSION: Continue with weight bearing activities,  practice standing next visit back in // bars and LE strengthening as appropriate.   5:04 PM, 09/22/22 Louis Meckel, PT Physical Therapist - [facility]  Outpatient Physical Therapy- [facility] [phone]     09/22/2022, 5:04 PM

## 2022-09-22 NOTE — Therapy (Signed)
Occupational Therapy Neuro Treatment/Recertication Note   Patient Name: Sherry [name] MRN: [id number] DOB:[birth year], 88 y.o., female Today's Date: 09/20/2022                                 PCP: Dr. Tillman Abide REFERRING PROVIDER: Dr. Earnestine Mealing   OT End of Session - 09/22/22 1241     Visit Number [id number]    Number of Visits 96    Date for OT Re-Evaluation 12/13/22    Authorization Time Period Progress report period starting 0812/2024    OT Start Time 1105    OT Stop Time 1145    OT Time Calculation (min) 40 min    Activity Tolerance Patient tolerated treatment well    Behavior During Therapy [facility] for tasks assessed/performed               Past Medical History:  Diagnosis Date   Acute blood loss anemia    Arthritis    Cancer (HCC)    skin   Central cord syndrome at C6 level of cervical spinal cord (HCC) 11/29/2017   Hypertension    Protein-calorie malnutrition, severe (HCC) 01/24/2018   S/P insertion of IVC (inferior vena caval) filter 01/24/2018   Tetraparesis (HCC)    Past Surgical History:  Procedure Laterality Date   ANTERIOR CERVICAL DECOMP/DISCECTOMY FUSION N/A 11/29/2017   Procedure: Cervical five-six, six-seven Anterior Cervical Decompression Fusion;  Surgeon: Jadene Pierini, MD;  Location: [facility] OR;  Service: Neurosurgery;  Laterality: N/A;  Cervical five-six, six-seven Anterior Cervical Decompression Fusion   CATARACT EXTRACTION     FEMUR IM NAIL Left 08/16/2021   Procedure: INTRAMEDULLARY (IM) NAIL FEMORAL;  Surgeon: Deeann Saint, MD;  Location: [facility] ORS;  Service: Orthopedics;  Laterality: Left;   IR IVC FILTER PLMT / S&I /IMG GUID/MOD SED  12/08/2017   Patient Active Problem List   Diagnosis Date Noted   Age-related osteoporosis without current pathological fracture 07/27/2022   Mild recurrent major depression (HCC) 07/27/2022   Hypertensive heart disease with other congestive heart failure (HCC) 02/14/2022   Chronic indwelling Foley  catheter 02/14/2022   Closed hip fracture (HCC) 08/15/2021   DVT (deep venous thrombosis) (HCC) 08/15/2021   Quadriplegia (HCC) 08/15/2021   Fall 08/15/2021   Constipation due to slow transit 08/31/2018   Trauma 06/05/2018   Neuropathic pain 06/05/2018   Neurogenic bowel 06/05/2018   Vaginal yeast infection 01/30/2018   Healthcare-associated pneumonia 01/25/2018   Chronic allergic rhinitis 01/24/2018   Depression with anxiety 01/24/2018   UTI due to Klebsiella species 01/24/2018   Protein-calorie malnutrition, severe (HCC) 01/24/2018   S/P insertion of IVC (inferior vena caval) filter 01/24/2018   Tetraparesis (HCC) 01/20/2018   Neurogenic bladder 01/20/2018   Reactive depression    Benign essential HTN    Acute postoperative anemia due to expected blood loss    Central cord syndrome at C6 level of cervical spinal cord (HCC) 11/29/2017   Allergy to alpha-gal 11/25/2016   SCC (squamous cell carcinoma) 04/08/2014    REFERRING DIAG: Central Cord Syndrome at C6 of the Cervical Spinal Cord, Fall with Bilateral Closed Hip Fractures, with ORIF repair with Intramedullary Nailing of the Left Hip Fracture.  THERAPY DIAG:   Muscle weakness (generalized)  Other lack of coordination  Rationale for Evaluation and Treatment Rehabilitation  PERTINENT HISTORY: Patient s/p fall November 29, 2017 resulting in diagnosis of central cord syndrome at C 6 level.  She has had therapy in multiple venues but with recent move to [facility], therapy staff recommended she seek outpatient therapy for her needs. Pt. Had a recent fall with Bilateral Closed Hip Fractures, with ORIF repair with Intramedullary Nailing of the Left Hip Fracture.   PRECAUTIONS: None  SUBJECTIVE:   Pt. reports that things are back to normal at [facility] now.  PAIN: Are you having pain No  OBJECTIVE:    Today's Treatment:    Manual Therapy:   Pt. tolerated soft tissue massage to the volar, and dorsal surface of each  digit on the bilateral hands, and thenar eminence secondary to stiffness following moist heat modality. Soft tissue mobilization was performed for Carpal, and metacarpal spread stretches. Manual therapy was performed independent of, and in preparation for therapeutic Ex.   Therapeutic Ex.    Pt. tolerated PROM followed by AROM bilateral wrist extension, PROM for bilateral digit MP, PIP, and DIP flexion, and extension, thumb radial, and palmar abduction. Emphasis was placed on left wrist extension with moist heat modality 2/2 tightness. Pt. worked on the BUE for strengthening, and reciprocal motion using the UBE while seated for 8 min. with no resistance. Constant monitoring was provided.      PATIENT EDUCATION: RUE Distal ROM, and strength Person educated: Patient Education method: Explanation and Demonstration Education comprehension: verbalized understanding, returned demonstration, and needs further education   HOME EXERCISE PROGRAM Continue with ongoing HEPs  Measurements:  12/09/2021:  Shoulder flexion: R: 120(136), L: 138(150) Shoulder abduction: R: 108(120), L: 110(120) Elbow: R: 0-148, L: 0-146 Wrist flexion: R: 55(62), L: 78 Writ extension: R: 34(55), L: 3(4) RD: R: 12(20), L: 16(26) UD: R: 8(24), L: 6(18) Thumb radial abduction: R: 11(20), L: 8(20) Digit flexion to the [facility]: R:  2nd: 5cm(3.5cm), 3rd: 7.5 cm(5cm), 4th: 7cm(3cm), 5th: 6cm(5.5cm) L: 2nd: 8cm(8cm), 3rd: 8cm(8cm), 4th: 7.5cm(7cm), 5th: 7cm(7cm)  02/03/2022:   Shoulder flexion: R: 122(138), L: 148(154) Shoulder abduction: R: 109(125), L: 125(125 Elbow: R: 0-148, L: 0-146 Wrist flexion: R: 60(68), L: 79 Writ extension: R: 40(55), L: 3(10) RD: R: 14(24), L: 20(28) UD: R: 8(24), L: 6(18) Thumb radial abduction: R: 20(25), L: 8(20) Digit flexion to the [facility]: R:  2nd: 4.5cm(3cm), 3rd: 6.5cm(4.5cm), 4th: 6.5 cm(3cm), 5th: 4.5cm(5cm) L: 2nd: 8cm(8cm), 3rd: 8cm(7cm), 4th: 7.5cm(7cm), 5th: 6.5cm(6.5cm)    04/07/2022:   Shoulder flexion: R: 125(150), L: 160(160) Shoulder abduction: R: 109(125), L: 125(125 Elbow: R: 0-148, L: 0-146 Wrist flexion: R: 60(68), L: 79 Writ extension: R: 40(55), L: 5(18) RD: R: 14(24), L: 20(28) UD: R: 18(24), L: 8(18) Thumb radial abduction: R: 20(25), L: 8(20) Digit flexion to the [facility]: R:  2nd: 4.5 cm(4 cm), 3rd: 6.5cm(3cm), 4th: 7 cm (5cm), 5th: 5cm(5cm) L: 2nd: 8cm(8cm), 3rd: 8cm(7cm), 4th: 7.5cm(7cm), 5th: 7cm(7cm)   07/05/2022:   Shoulder flexion: R: 125(154), L: 160(160) Shoulder abduction: R: 95(130), L: 143(143) Elbow: R: 0-148, L: 0-146 Wrist flexion: R: 60(68), L: 79 Writ extension: R: 40(60), L: 0(12) RD: R: 10(24), L: 12(20) UD: R: 16(24), L: 8(16) Thumb radial abduction: R: 20(25), L: 8(20) Digit flexion to the [facility]: R:  2nd: 4 cm(4cm), 3rd: 6.5cm( 3.5cm), 4th: 7 cm (5cm), 5th: 6cm(5cm) L: 2nd: 8cm(8cm), 3rd: 8cm(7cm), 4th: 8cm(7cm), 5th: 7cm(7cm)  09/13/2022:   Shoulder flexion: R: 125(154), L: 160(160) Shoulder abduction: R: 100(130), L: 143(143) Elbow: R: 0-150, L: 0-150 Wrist flexion: R: 55(68), L: 79 Writ extension: R: 45(60), L: 0(12) RD: R: 10(24), L: 12(20) UD: R: 16(24), L: 8(16)  Thumb radial abduction: R: 20(25), L: 8(20) Digit flexion to the [facility]: R:  2nd: 4 cm(4cm), 3rd: 6cm( 5cm), 4th: 6 cm (4cm), 5th: 5cm(5cm) L: 2nd: 7cm(cm), 3rd: 7cm(6cm), 4th: 7cm(6cm), 5th: 6cm(6cm)      OT Long Term Goals - 07/06/21 1106                                                                                          Target Date: 12/13/2022      OT LONG TERM GOAL #1   Title Patient and caregiver will demonstrate understanding of home exercise program for ROM.    Baseline 12/09/2021: Pt. Reports the restorative ROM program has not resumed at the facility. 10/28/2021: Pt. Is attempting exercises/stretches at home. The facility restorative aides have not resumed exercises. 09/14/2021:  Pt. Reports inconsistencies with restorative ROM at  her  care home. Pt/caregiver education to be provided about ongoing ROM needs.    Time 12    Period Weeks    Status Deferred     OT LONG TERM GOAL #2   Title Pt.  will improve BUE strength to be able to sustain her UE's in elevation to efficiently, and effectively perform hair care, and self-grooming tasks.   Baseline 02/03/2022: Pt. has improved, and is now able to sustain her Bilateral UEs in elevation long enough to efficiently, and effectively perform haircare, and self-grooming tasks. 12/09/2021: right shoulder 4-/5, left shoulder 4/5. Pt. has improved to be able to reach up for hair care, and self-grooming tasks. 10/28/2021: Right shoulder 3+/5, Left shoulder 4-/5 09/14/2021:  BUE shoulder strength: 3+/5 for shoulder flexion, and abduction.   Time 12    Period Weeks    Status Achieved               OT LONG TERM GOAL #3   Title Patient will demonstrate improved composite finger flexion to be able to firmly hold  and use adaptive devices during ADLs, and IADLs.   Baseline 09/20/2022: Improving digit flexion to the [facility]. 09/13/22: improved digit flexion to the [facility]. 07/05/2022: Pt. presents with digit MP, PIP, and DIP extensor tightness limiting her ability to securely grip objects in her bilateral hands.  04/07/2022: Pt. Has improved with right 2nd, and 3rd digit flexion towards the [facility]. Pt. Continues to have difficulty securely holding and applying deodorant.02/03/2022: Pt. Has improved with digit flexion, however continues to have difficulty securely holding and using deoderant. 12/09/2021: Bilateral hand/digit MP, PIP, and DIP extension tightness limits her ability achieve digit flexion to hold, and apply deodorant. 10/28/2021:  Pt. continues to have difficulty holding the deodorant. 01/12/2021: Pt. Presents with limited digit extension. Pt. Is able to initiate holding deodorant, however is unable to hold it while using it.   Time 12    Period Weeks    Status Revised   Target Date                  OT  LONG TERM GOAL #4   Title Pt. Will improve bilateral wrist extension in preparation for anticipating, and initiating reaching for objects at the table.    Baseline 09/20/2022: limited PROM  in the Left wrist extension 2/2 tightness/stiffness. 09/13/22: R: 55(68), L: 79 07/19/2022: Bilateral wrist extension in limited. 07/05/2022: Right: 40(60) Left: 0(12) 04/07/2022: Rght: 40(55) Left: 5(18) 02/03/2022: Right: 34(55) Left: 3(4) 12/09/2021: Right  34(55)  Left  3(4) 10/28/2021:  Right 22(38), Left 0(15) 09/14/2021:  Right: 17(35), left 2(15)   Time 12   Period weeks   Status Ongoing   Target Date      OT LONG TERM GOAL #5   Title Pt. will write, and sign her name with 100% legibility, and modified independence.    Baseline 09/13/22: 100% legibility to sign name, intermittent tremors 07/19/2022:  Pt. continues to be able to consistently fill out daily meal menus, and puzzles. 6/03/204: Pt. Is consistently filling out  daily meal menus, and puzzles. 04/07/2022: Pt. Reports that she continues to present with shaky appearing writing. Pt.reports that she is writing more when filling out her menu, and complete puzzles 02/03/2022: Pt. Continues to report legibility appears "shaky" when attempting signing her name. Pt.reports that she is writing more when filling out her menu, and complete puzzles.12/09/2021: Pt. Reports legibility appears "shaky" when attempting signing her name. Pt.reports that she is writing more when filling out her menu. 10/28/2021: pt. is able to sign her name, however legibility continues to be inconsistent. Pt. Reports "shaky appearance". 09/14/2021: Pt. continues to fill out her daily menu and complete puzzles. Pt. Continues to present with limited writing legibility.   Time 12    Period Weeks    Status  Achieved         OT LONG TERM GOAL  #6   TITLE Pt. will turn pages in a book with modified independence.    Baseline 02/03/2022: Pt. Is able to effectively turn pages of a book. 10/28/2021: Pt. Is  able to consistently turn book pages. 09/14/2021: Pt. has difficulty turning pages.    Time 12    Period Weeks    Status Achieved       OT LONG TERM GOAL  #7  TITLE Pt. Will increase  bilateral lateral pinch strength by 2 lbs to be able to securely grasp items during ADLs, and IADL tasks.   Baseline 09/20/2022: lateral pinch continues to be limited 09/13/22: R 6.5# L 4# 07/19/2022: TBD 07/05/2022: TBD 07/2022: NT-Pinch meter out for calibration. 02/03/2022: Right: 6#, Left: 4# 12/09/2021:  Right: 6#, Left: 4#   Time 12  Period Weeks  Status Ongoing           OT LONG TERM GOAL  #8  TITLE Pt. Will button a sweater with large 1" buttons, and zip her jacket with modified independence.   Baseline 06/07/2022: Pt. is able to use a button hook to button a sweater. Pt. Reports that she does not wear button down clothing often.  04/07/2022: Pt. Has difficulty manipulating buttons, however was introduced to a buttonhook. Pt. Was able to hold, and use a buttonhook for buttons on a shirt. 02/03/2022: Pt. is unable to manipulate buttons on her sweater, or zippers on her jacket.  Time 12  Period Weeks  Status Achieved   OT LONG TERM GOAL  #9  TITLE Pt. will complete plant care with modified independence.  Baseline 09/20/2022: Pt. Continues to have difficulty reaching for thorough plant care. 09/13/22: continues to report intermittent difficulty 07/19/2022: Pt. Continues to be able to water, and care for some  of her plants. Pt. Has more difficulty with plants that are harder to reach. 07/05/2022: pt. Is able to water,  and care for some  of her plants. Pt. Has more difficulty with plants that are harder to reach. 04/07/2022: Pt. is now able to hold a cup and water her plants.02/03/2022: Pt. has difficulty caring for her plants.   Time 12  Period Weeks  Status Ongoing   OT LONG TERM GOAL  #10  TITLE Pt. will demonstrate adaptive techniques to assist with the efficiency of self-dressing, or morning care tasks.   Baseline 09/20/2022: Pt. continues to require assist form personal, and staff care aides. 09/13/22: MOD A dressing, reports she is often rushed 07/19/2022: Pt. continues to require assist with self-dressing/morning care tasks.   07/05/2022: Continue with goal.  04/07/2022: Pt. Continues to require assist with the efficiency of self-dressing, and morning care tasks.02/03/2022: Pt. requires assist from caregivers 2/2 time limitations during morning care.   Time 12  Period Weeks  Status Ongoing  OT LONG TERM GOAL  #11 Title Pt.  will improve BUE strength to improve ADL, and IADL functioning.  Baseline 09/20/2022: Pt. Presents with limited BUE strength 09/13/22: shoulder flexion L 4/5, R 3/5; elbow flexion B 5/5, elbow extension 4-/5; shoulder abduction L 4+/5, R 3+/5. 07/19/2022:  BUE strength continues to be limited. 07/05/2022: shoulder flexion: right 4-/5, abduction: 3+/5, elbow flexion: right: 5/5, left 5/5, extension: right: 4-/5, left 4-/5, wrist extension: right: 3-/5, left: 2-/5  Time 12   Period Weeks   Status Ongoing     Plan - 07/22/21 1737     Clinical Impression Pt. continues to tolerate manual therapy, there. Ex., and ROM. Pt. Presents with tightness at the bilateral thumb webspace, and bilateral wrist flexor tightness, as well as digit MP, PIP, and DIP extensor tightness which continues to limit her ability to complete ADL tasks efficiently. Pt. continues to benefit from working on impoving ROM in order to work towards increasing bilateral hand grasp on objects, and increase engagement of bilateral hands during ADLs, and IADL tasks.       OT Occupational Profile and History Detailed Assessment- Review of Records and additional review of physical, cognitive, psychosocial history related to current functional performance    Occupational performance deficits (Please refer to evaluation for details): ADL's;IADL's;Leisure;Social Participation    Body Structure / Function / Physical Skills  ADL;Continence;Dexterity;Flexibility;Strength;ROM;Balance;Coordination;FMC;IADL;Endurance;UE functional use;Decreased knowledge of use of DME;[facility]    Psychosocial Skills Coping Strategies    Rehab Potential Fair    Clinical Decision Making Several treatment options, min-mod task modification necessary    Comorbidities Affecting Occupational Performance: Presence of comorbidities impacting occupational performance    Comorbidities impacting occupational performance description: contractures of bilateral hands, dependent transfers    Modification or Assistance to Complete Evaluation  No modification of tasks or assist necessary to complete eval    OT Frequency 2x / week    OT Duration 12 weeks    OT Treatment/Interventions Self-care/ADL training;Cryotherapy;Therapeutic exercise;DME and/or AE instruction;Balance training;Neuromuscular education;Manual Therapy;Splinting;Moist Heat;Passive range of motion;Therapeutic activities;Patient/family education    Plan continue to progress ROM of digits, wrists and shoulders as it pertains to completion of ADL tasks that pt values.    Consulted and Agree with Plan of Care Patient         Olegario Messier, MS, OTR/L  09/22/22, 3:12 PM

## 2022-09-27 ENCOUNTER — Ambulatory Visit: Payer: Medicare HMO

## 2022-09-27 ENCOUNTER — Ambulatory Visit: Payer: Medicare HMO | Admitting: Occupational Therapy

## 2022-09-27 DIAGNOSIS — R269 Unspecified abnormalities of gait and mobility: Secondary | ICD-10-CM

## 2022-09-27 DIAGNOSIS — M6281 Muscle weakness (generalized): Secondary | ICD-10-CM

## 2022-09-27 DIAGNOSIS — S14129S Central cord syndrome at unspecified level of cervical spinal cord, sequela: Secondary | ICD-10-CM

## 2022-09-27 DIAGNOSIS — R262 Difficulty in walking, not elsewhere classified: Secondary | ICD-10-CM

## 2022-09-27 DIAGNOSIS — R278 Other lack of coordination: Secondary | ICD-10-CM

## 2022-09-27 NOTE — Therapy (Signed)
OUTPATIENT PHYSICAL THERAPY NEURO TREATMENT   Patient Name: Sherry [name] MRN: [id number] DOB:Mar 24, 1934, 88 y.o., female Today's Date: 09/27/2022   PCP: Sherry Mealing, MD REFERRING PROVIDER: Deeann [name]   PT End of Session - 09/27/22 1302     Visit Number [id number]    Number of Visits 74    Date for PT Re-Evaluation 09/27/22    PT Start Time 1145    PT Stop Time 1225    PT Time Calculation (min) 40 min    Equipment Utilized During Treatment Gait belt    Activity Tolerance Patient tolerated treatment well;No increased pain;Patient limited by fatigue    Behavior During Therapy [facility] for tasks assessed/performed              Past Medical History:  Diagnosis Date   Acute blood loss anemia    Arthritis    Cancer (HCC)    skin   Central cord syndrome at C6 level of cervical spinal cord (HCC) 11/29/2017   Hypertension    Protein-calorie malnutrition, severe (HCC) 01/24/2018   S/P insertion of IVC (inferior vena caval) filter 01/24/2018   Tetraparesis (HCC)    Past Surgical History:  Procedure Laterality Date   ANTERIOR CERVICAL DECOMP/DISCECTOMY FUSION N/A 11/29/2017   Procedure: Cervical five-six, six-seven Anterior Cervical Decompression Fusion;  Surgeon: Sherry Pierini, MD;  Location: [facility] OR;  Service: Neurosurgery;  Laterality: N/A;  Cervical five-six, six-seven Anterior Cervical Decompression Fusion   CATARACT EXTRACTION     FEMUR IM NAIL Left 08/16/2021   Procedure: INTRAMEDULLARY (IM) NAIL FEMORAL;  Surgeon: Sherry Saint, MD;  Location: [facility] ORS;  Service: Orthopedics;  Laterality: Left;   IR IVC FILTER PLMT / S&I /IMG GUID/MOD SED  12/08/2017   Patient Active Problem List   Diagnosis Date Noted   Age-related osteoporosis without current pathological fracture 07/27/2022   Mild recurrent major depression (HCC) 07/27/2022   Hypertensive heart disease with other congestive heart failure (HCC) 02/14/2022   Chronic indwelling Foley catheter 02/14/2022   Closed  hip fracture (HCC) 08/15/2021   DVT (deep venous thrombosis) (HCC) 08/15/2021   Quadriplegia (HCC) 08/15/2021   Fall 08/15/2021   Constipation due to slow transit 08/31/2018   Trauma 06/05/2018   Neuropathic pain 06/05/2018   Neurogenic bowel 06/05/2018   Vaginal yeast infection 01/30/2018   Healthcare-associated pneumonia 01/25/2018   Chronic allergic rhinitis 01/24/2018   Depression with anxiety 01/24/2018   UTI due to Klebsiella species 01/24/2018   Protein-calorie malnutrition, severe (HCC) 01/24/2018   S/P insertion of IVC (inferior vena caval) filter 01/24/2018   Tetraparesis (HCC) 01/20/2018   Neurogenic bladder 01/20/2018   Reactive depression    Benign essential HTN    Acute postoperative anemia due to expected blood loss    Central cord syndrome at C6 level of cervical spinal cord (HCC) 11/29/2017   Allergy to alpha-gal 11/25/2016   SCC (squamous cell carcinoma) 04/08/2014    ONSET DATE: 08/15/2021 (fall with B hip fx); Initial injury was in 2019- quadraplegia due to central cord syndrome of C6 secondary to neck fracture.   REFERRING DIAG: Bilateral Hip fx; Left Open- s/p Left IM nail femoral on 08/16/2021; Right closed  THERAPY DIAG:  Muscle weakness (generalized)  Other lack of coordination  Central cord syndrome, sequela (HCC)  Abnormality of gait and mobility  Difficulty in walking, not elsewhere classified  Rationale for Evaluation and Treatment Rehabilitation  SUBJECTIVE:   SUBJECTIVE STATEMENT:  Pt doing well today, no updates since last session, awaiting the  start of football season.     Pt accompanied by:  Paid caregiver-   PERTINENT HISTORY: Sherry [name] is an 87yoF who experienced a fall at home on 08/15/2021 with hip fracture, s/p Left hip ORIF. PMH: quadriplegia due to central cord syndrome of C6 secondary to neck fracture, HTN, depression with anxiety, neurogenic bladder, bilateral DVT on Xarelto (s/p of IVC filter placement). Prior history  of significant PT/OT with improvement of function. Pt has full use of shoulder/elbows, limited use of hands, adaptive self feeding sucessful.     PAIN:  None   OBJECTIVE:    TODAY'S TREATMENT:  09/27/22   -in powerchair, static stretching: combined hip/knee/ankle flexion to tolerated range 3x90 seconds bilat -manually resisted hip extension from end-range flexion 1x12 bilat -LAQ 2x12 Rt, 0lb, assist for full range, LAQ 2x12 Left @ 2.5lb -sitsups from 35 degrees recline: 1x12 @ 0lb, then 2x12 c 2000g ball at sternum    PATIENT EDUCATION: Education details: cues for isolated joint exercises  HOME EXERCISE PROGRAM: No changes at this time  Access Code: [facility] URL: [email or web address] Date: 11/23/2021 Prepared by: Precious Bard Exercises - Seated Gluteal Sets - 2 x daily - 7 x weekly - 2 sets - 10 reps - 5 hold - Seated Quad Set - 2 x daily - 7 x weekly - 2 sets - 10 reps - 5 hold - Seated Long Arc Quad - 2 x daily - 7 x weekly - 2 sets - 10 reps - 5 hold - Seated March - 2 x daily - 7 x weekly - 2 sets - 10 reps - 5 hold - Seated Hip Abduction - 2 x daily - 7 x weekly - 2 sets - 10 reps - 5 hold - Seated Shoulder Shrugs - 2 x daily - 7 x weekly - 2 sets - 10 reps - 5 hold - Wheelchair Pressure Relief - 2 x daily - 7 x weekly - 2 sets - 10 reps - 5 hold  GOALS: Goals reviewed with patient? Yes  SHORT TERM GOALS: Target date: 02/22/2022  Pt will be independent with initial UE strengthening HEP in order to improve strength and balance in order to decrease fall risk and improve function at home and work. Baseline: 10/19/2021- patient with no formal UE HEP; 12/02/2021= Patient verbalized knowledge of HEP including use of theraband for UE strengthening.  Goal status: GOAL MET  LONG TERM GOALS: Target date: 09/27/2022  Pt will be independent with final for UE/LE HEP in order to improve strength and balance in order to decrease fall risk and improve function at home and  work. Baseline: Patient is currently BLE NWB and unable to participate in HEP. Has order for UE strengthening. 01/11/2022- Patient now able to participate in LE strengthening although NWB still- good understanding for some basic exercises. Will keep goal active to incorporate progressive LE strengthening exercises. 04/07/2022= Patient still participating in progressive LE seated HEP and has no questions at this time.  Goal status: MET  2.  Pt will improve FOTO to target score of 35  to display perceived improvements in ability to complete ADL's.  Baseline: 10/19/2021= 12; 12/02/2021= 17; 01/11/2022=17; 04/07/2022= 17; 07/05/2022=17 Goal status: Ongoing  3.  Pt will increase strength of B UE  by at least 1/2 MMT grade in order to demonstrate improvement in strength and function  Baseline: patient has range of 2-/5 to 4/5 BUE Strength; 12/02/2021= 4/5 except with wrist ext. 01/11/2022= 4/5 B UE strength expept for wrist Goal  status: Goal revised-no longer appropriate- working with OT on all UE strengthening.   4. Pt. Will increase strength of RLE by at least 1/2 MMT grade in order to demonstrate improvement in Standing/transfers.  Baseline: 2-/5 Right hip flex/knee ext/flex; 04/07/2022= 2- with right hip flex/knee ext (lacking 28 deg from zero) 4/8= Patient able to ext right knee lacking 18 deg from zero. 07/05/2022=Left knee approx 8 deg from zero and right knee = 23 deg from zero  Goal status: Progressing  5. Pt. Will demo ability to stand pivot transfer with max assist for improved functional mobility and less dependent need on mechanical device.   Baseline: Dependent on hoyer lift for all transfers; 04/07/2022- Unable to test secondary to patient with recent UTI and right hand procedure - will attempt to reassess next visit. ; 04/12/22: Unable to successfully stand due to feet plates on loaned power w/c; 05/10/2022- Patient still having to use rental chair and has not received her original power w/c back to  practice SPT. 07/04/2021= Patient just received new power w/c and attempted standing from 1st time today- Patient able to stand with max assist today from w/c and did not attempt to perform pivot transfer- difficulty with static standing and placing weight on right LE.   Goal status: ONGOING  6. Pt. Will demonstrate improved functional LE strength as seen by ability to stand > 2 min for improved transfer ability and pregait abilities.   Baseline: not assessed today due to recent dx: UTI; 04/07/2022- Unable to test secondary to patient with recent UTI and right hand procedure - will attempt to reassess next visit; 04/12/22: Unable to successfully stand due to feet plates on loaned power w/c 05/10/2022- Patient still having to use rental chair and has not received her original power w/c back to practice Standing. 07/05/2022- Patient able to stand with max assist at trunk today for 2 min 3 sec. Will keep goal active to ensure consistency.   Goal status: ONGOING  ASSESSMENT:  CLINICAL IMPRESSION: Pt tolerates interventions well, intensity dialed in with fatigue as expected. Pt shows slightly more weakness on RLE today than typical, but otherwise continues to progress in strength overall. Able to incorporate trunk resistance to situps today. Pt will continue to benefit from skilled PT services to optimize independence and reduced caregiver support.    OBJECTIVE IMPAIRMENTS Abnormal gait, decreased activity tolerance, decreased balance, decreased coordination, decreased endurance, decreased mobility, difficulty walking, decreased ROM, decreased strength, hypomobility, impaired flexibility, impaired UE functional use, postural dysfunction, and pain.   ACTIVITY LIMITATIONS carrying, lifting, bending, sitting, standing, squatting, sleeping, stairs, transfers, bed mobility, continence, bathing, toileting, dressing, self feeding, reach over head, hygiene/grooming, and caring for others  PARTICIPATION LIMITATIONS: meal  prep, cleaning, laundry, medication management, personal finances, interpersonal relationship, driving, shopping, community activity, and yard work  PERSONAL FACTORS Age, Time since onset of injury/illness/exacerbation, and 1-2 comorbidities: HTN, cervical Sx  are also affecting patient's functional outcome.   REHAB POTENTIAL: Good  CLINICAL DECISION MAKING: Evolving/moderate complexity  EVALUATION COMPLEXITY: Moderate  PLAN: PT FREQUENCY: 1-2x/week  PT DURATION: 12 weeks  PLANNED INTERVENTIONS: Therapeutic exercises, Therapeutic activity, Neuromuscular re-education, Balance training, Gait training, Patient/Family education, Self Care, Joint mobilization, DME instructions, Dry Needling, Electrical stimulation, Wheelchair mobility training, Spinal mobilization, Cryotherapy, Moist heat, and Manual therapy  PLAN FOR NEXT SESSION: strength, cortraining, motor control postural righting performance endurance   1:04 PM, 09/27/22 Rosamaria Lints, PT, DPT Physical Therapist - [facility]  Outpatient Physical  [facility] [phone]

## 2022-09-27 NOTE — Therapy (Signed)
Occupational Therapy Neuro Treatment/Recertication Note   Patient Name: Sherry [name] MRN: [id number] DOB:02-Sep-1934, 88 y.o., female Today's Date: 09/20/2022                                 PCP: Dr. Tillman Abide REFERRING PROVIDER: Dr. Earnestine Mealing   OT End of Session - 09/27/22 1515     Visit Number [id number]    Number of Visits 96    Date for OT Re-Evaluation 12/13/22    Authorization Time Period Progress report period starting 0812/2024    OT Start Time 1100    OT Stop Time 1143    OT Time Calculation (min) 43 min    Activity Tolerance Patient tolerated treatment well    Behavior During Therapy [facility] for tasks assessed/performed               Past Medical History:  Diagnosis Date   Acute blood loss anemia    Arthritis    Cancer (HCC)    skin   Central cord syndrome at C6 level of cervical spinal cord (HCC) 11/29/2017   Hypertension    Protein-calorie malnutrition, severe (HCC) 01/24/2018   S/P insertion of IVC (inferior vena caval) filter 01/24/2018   Tetraparesis (HCC)    Past Surgical History:  Procedure Laterality Date   ANTERIOR CERVICAL DECOMP/DISCECTOMY FUSION N/A 11/29/2017   Procedure: Cervical five-six, six-seven Anterior Cervical Decompression Fusion;  Surgeon: Jadene Pierini, MD;  Location: [facility] OR;  Service: Neurosurgery;  Laterality: N/A;  Cervical five-six, six-seven Anterior Cervical Decompression Fusion   CATARACT EXTRACTION     FEMUR IM NAIL Left 08/16/2021   Procedure: INTRAMEDULLARY (IM) NAIL FEMORAL;  Surgeon: Deeann Saint, MD;  Location: [facility] ORS;  Service: Orthopedics;  Laterality: Left;   IR IVC FILTER PLMT / S&I /IMG GUID/MOD SED  12/08/2017   Patient Active Problem List   Diagnosis Date Noted   Age-related osteoporosis without current pathological fracture 07/27/2022   Mild recurrent major depression (HCC) 07/27/2022   Hypertensive heart disease with other congestive heart failure (HCC) 02/14/2022   Chronic indwelling Foley  catheter 02/14/2022   Closed hip fracture (HCC) 08/15/2021   DVT (deep venous thrombosis) (HCC) 08/15/2021   Quadriplegia (HCC) 08/15/2021   Fall 08/15/2021   Constipation due to slow transit 08/31/2018   Trauma 06/05/2018   Neuropathic pain 06/05/2018   Neurogenic bowel 06/05/2018   Vaginal yeast infection 01/30/2018   Healthcare-associated pneumonia 01/25/2018   Chronic allergic rhinitis 01/24/2018   Depression with anxiety 01/24/2018   UTI due to Klebsiella species 01/24/2018   Protein-calorie malnutrition, severe (HCC) 01/24/2018   S/P insertion of IVC (inferior vena caval) filter 01/24/2018   Tetraparesis (HCC) 01/20/2018   Neurogenic bladder 01/20/2018   Reactive depression    Benign essential HTN    Acute postoperative anemia due to expected blood loss    Central cord syndrome at C6 level of cervical spinal cord (HCC) 11/29/2017   Allergy to alpha-gal 11/25/2016   SCC (squamous cell carcinoma) 04/08/2014    REFERRING DIAG: Central Cord Syndrome at C6 of the Cervical Spinal Cord, Fall with Bilateral Closed Hip Fractures, with ORIF repair with Intramedullary Nailing of the Left Hip Fracture.  THERAPY DIAG:   Muscle weakness (generalized)  Other lack of coordination  Rationale for Evaluation and Treatment Rehabilitation  PERTINENT HISTORY: Patient s/p fall November 29, 2017 resulting in diagnosis of central cord syndrome at C 6 level.  She has had therapy in multiple venues but with recent move to [facility], therapy staff recommended she seek outpatient therapy for her needs. Pt. Had a recent fall with Bilateral Closed Hip Fractures, with ORIF repair with Intramedullary Nailing of the Left Hip Fracture.   PRECAUTIONS: None  SUBJECTIVE:   Pt. reports  doing well today  PAIN: Are you having pain No  OBJECTIVE:    Today's Treatment:    Manual Therapy:   Pt. tolerated soft tissue massage to the volar, and dorsal surface of each digit on the bilateral hands, and  thenar eminence secondary to stiffness following moist heat modality. Soft tissue mobilization was performed for Carpal, and metacarpal spread stretches. Manual therapy was performed independent of, and in preparation for therapeutic Ex.   Therapeutic Ex.    Pt. tolerated PROM followed by AROM bilateral wrist extension, PROM for bilateral digit MP, PIP, and DIP flexion, and extension, thumb radial, and palmar abduction. Emphasis was placed on left wrist extension with moist heat modality 2/2 tightness. Pt. performed 2.5# dowel ex. For UE strengthening secondary to weakness. Bilateral shoulder flexion, chest press, circular patterns, and elbow flexion/extension were performed.     PATIENT EDUCATION: RUE Distal ROM, and strength Person educated: Patient Education method: Explanation and Demonstration Education comprehension: verbalized understanding, returned demonstration, and needs further education   HOME EXERCISE PROGRAM Continue with ongoing HEPs  Measurements:  12/09/2021:  Shoulder flexion: R: 120(136), L: 138(150) Shoulder abduction: R: 108(120), L: 110(120) Elbow: R: 0-148, L: 0-146 Wrist flexion: R: 55(62), L: 78 Writ extension: R: 34(55), L: 3(4) RD: R: 12(20), L: 16(26) UD: R: 8(24), L: 6(18) Thumb radial abduction: R: 11(20), L: 8(20) Digit flexion to the [facility]: R:  2nd: 5cm(3.5cm), 3rd: 7.5 cm(5cm), 4th: 7cm(3cm), 5th: 6cm(5.5cm) L: 2nd: 8cm(8cm), 3rd: 8cm(8cm), 4th: 7.5cm(7cm), 5th: 7cm(7cm)  02/03/2022:   Shoulder flexion: R: 122(138), L: 148(154) Shoulder abduction: R: 109(125), L: 125(125 Elbow: R: 0-148, L: 0-146 Wrist flexion: R: 60(68), L: 79 Writ extension: R: 40(55), L: 3(10) RD: R: 14(24), L: 20(28) UD: R: 8(24), L: 6(18) Thumb radial abduction: R: 20(25), L: 8(20) Digit flexion to the [facility]: R:  2nd: 4.5cm(3cm), 3rd: 6.5cm(4.5cm), 4th: 6.5 cm(3cm), 5th: 4.5cm(5cm) L: 2nd: 8cm(8cm), 3rd: 8cm(7cm), 4th: 7.5cm(7cm), 5th: 6.5cm(6.5cm)   04/07/2022:    Shoulder flexion: R: 125(150), L: 160(160) Shoulder abduction: R: 109(125), L: 125(125 Elbow: R: 0-148, L: 0-146 Wrist flexion: R: 60(68), L: 79 Writ extension: R: 40(55), L: 5(18) RD: R: 14(24), L: 20(28) UD: R: 18(24), L: 8(18) Thumb radial abduction: R: 20(25), L: 8(20) Digit flexion to the [facility]: R:  2nd: 4.5 cm(4 cm), 3rd: 6.5cm(3cm), 4th: 7 cm (5cm), 5th: 5cm(5cm) L: 2nd: 8cm(8cm), 3rd: 8cm(7cm), 4th: 7.5cm(7cm), 5th: 7cm(7cm)   07/05/2022:   Shoulder flexion: R: 125(154), L: 160(160) Shoulder abduction: R: 95(130), L: 143(143) Elbow: R: 0-148, L: 0-146 Wrist flexion: R: 60(68), L: 79 Writ extension: R: 40(60), L: 0(12) RD: R: 10(24), L: 12(20) UD: R: 16(24), L: 8(16) Thumb radial abduction: R: 20(25), L: 8(20) Digit flexion to the [facility]: R:  2nd: 4 cm(4cm), 3rd: 6.5cm( 3.5cm), 4th: 7 cm (5cm), 5th: 6cm(5cm) L: 2nd: 8cm(8cm), 3rd: 8cm(7cm), 4th: 8cm(7cm), 5th: 7cm(7cm)  09/13/2022:   Shoulder flexion: R: 125(154), L: 160(160) Shoulder abduction: R: 100(130), L: 143(143) Elbow: R: 0-150, L: 0-150 Wrist flexion: R: 55(68), L: 79 Writ extension: R: 45(60), L: 0(12) RD: R: 10(24), L: 12(20) UD: R: 16(24), L: 8(16) Thumb radial abduction: R: 20(25), L: 8(20) Digit flexion  to the [facility]: R:  2nd: 4 cm(4cm), 3rd: 6cm( 5cm), 4th: 6 cm (4cm), 5th: 5cm(5cm) L: 2nd: 7cm(cm), 3rd: 7cm(6cm), 4th: 7cm(6cm), 5th: 6cm(6cm)      OT Long Term Goals - 07/06/21 1106                                                                                          Target Date: 12/13/2022      OT LONG TERM GOAL #1   Title Patient and caregiver will demonstrate understanding of home exercise program for ROM.    Baseline 12/09/2021: Pt. Reports the restorative ROM program has not resumed at the facility. 10/28/2021: Pt. Is attempting exercises/stretches at home. The facility restorative aides have not resumed exercises. 09/14/2021:  Pt. Reports inconsistencies with restorative ROM at  her care home.  Pt/caregiver education to be provided about ongoing ROM needs.    Time 12    Period Weeks    Status Deferred     OT LONG TERM GOAL #2   Title Pt.  will improve BUE strength to be able to sustain her UE's in elevation to efficiently, and effectively perform hair care, and self-grooming tasks.   Baseline 02/03/2022: Pt. has improved, and is now able to sustain her Bilateral UEs in elevation long enough to efficiently, and effectively perform haircare, and self-grooming tasks. 12/09/2021: right shoulder 4-/5, left shoulder 4/5. Pt. has improved to be able to reach up for hair care, and self-grooming tasks. 10/28/2021: Right shoulder 3+/5, Left shoulder 4-/5 09/14/2021:  BUE shoulder strength: 3+/5 for shoulder flexion, and abduction.   Time 12    Period Weeks    Status Achieved               OT LONG TERM GOAL #3   Title Patient will demonstrate improved composite finger flexion to be able to firmly hold  and use adaptive devices during ADLs, and IADLs.   Baseline 09/20/2022: Improving digit flexion to the [facility]. 09/13/22: improved digit flexion to the [facility]. 07/05/2022: Pt. presents with digit MP, PIP, and DIP extensor tightness limiting her ability to securely grip objects in her bilateral hands.  04/07/2022: Pt. Has improved with right 2nd, and 3rd digit flexion towards the [facility]. Pt. Continues to have difficulty securely holding and applying deodorant.02/03/2022: Pt. Has improved with digit flexion, however continues to have difficulty securely holding and using deoderant. 12/09/2021: Bilateral hand/digit MP, PIP, and DIP extension tightness limits her ability achieve digit flexion to hold, and apply deodorant. 10/28/2021:  Pt. continues to have difficulty holding the deodorant. 01/12/2021: Pt. Presents with limited digit extension. Pt. Is able to initiate holding deodorant, however is unable to hold it while using it.   Time 12    Period Weeks    Status Revised   Target Date                  OT LONG TERM  GOAL #4   Title Pt. Will improve bilateral wrist extension in preparation for anticipating, and initiating reaching for objects at the table.    Baseline 09/20/2022: limited PROM in the Left wrist extension 2/2 tightness/stiffness. 09/13/22: R:  55(68), L: 79 07/19/2022: Bilateral wrist extension in limited. 07/05/2022: Right: 40(60) Left: 0(12) 04/07/2022: Rght: 40(55) Left: 5(18) 02/03/2022: Right: 34(55) Left: 3(4) 12/09/2021: Right  34(55)  Left  3(4) 10/28/2021:  Right 22(38), Left 0(15) 09/14/2021:  Right: 17(35), left 2(15)   Time 12   Period weeks   Status Ongoing   Target Date      OT LONG TERM GOAL #5   Title Pt. will write, and sign her name with 100% legibility, and modified independence.    Baseline 09/13/22: 100% legibility to sign name, intermittent tremors 07/19/2022:  Pt. continues to be able to consistently fill out daily meal menus, and puzzles. 6/03/204: Pt. Is consistently filling out  daily meal menus, and puzzles. 04/07/2022: Pt. Reports that she continues to present with shaky appearing writing. Pt.reports that she is writing more when filling out her menu, and complete puzzles 02/03/2022: Pt. Continues to report legibility appears "shaky" when attempting signing her name. Pt.reports that she is writing more when filling out her menu, and complete puzzles.12/09/2021: Pt. Reports legibility appears "shaky" when attempting signing her name. Pt.reports that she is writing more when filling out her menu. 10/28/2021: pt. is able to sign her name, however legibility continues to be inconsistent. Pt. Reports "shaky appearance". 09/14/2021: Pt. continues to fill out her daily menu and complete puzzles. Pt. Continues to present with limited writing legibility.   Time 12    Period Weeks    Status  Achieved         OT LONG TERM GOAL  #6   TITLE Pt. will turn pages in a book with modified independence.    Baseline 02/03/2022: Pt. Is able to effectively turn pages of a book. 10/28/2021: Pt. Is able to  consistently turn book pages. 09/14/2021: Pt. has difficulty turning pages.    Time 12    Period Weeks    Status Achieved       OT LONG TERM GOAL  #7  TITLE Pt. Will increase  bilateral lateral pinch strength by 2 lbs to be able to securely grasp items during ADLs, and IADL tasks.   Baseline 09/20/2022: lateral pinch continues to be limited 09/13/22: R 6.5# L 4# 07/19/2022: TBD 07/05/2022: TBD 07/2022: NT-Pinch meter out for calibration. 02/03/2022: Right: 6#, Left: 4# 12/09/2021:  Right: 6#, Left: 4#   Time 12  Period Weeks  Status Ongoing           OT LONG TERM GOAL  #8  TITLE Pt. Will button a sweater with large 1" buttons, and zip her jacket with modified independence.   Baseline 06/07/2022: Pt. is able to use a button hook to button a sweater. Pt. Reports that she does not wear button down clothing often.  04/07/2022: Pt. Has difficulty manipulating buttons, however was introduced to a buttonhook. Pt. Was able to hold, and use a buttonhook for buttons on a shirt. 02/03/2022: Pt. is unable to manipulate buttons on her sweater, or zippers on her jacket.  Time 12  Period Weeks  Status Achieved   OT LONG TERM GOAL  #9  TITLE Pt. will complete plant care with modified independence.  Baseline 09/20/2022: Pt. Continues to have difficulty reaching for thorough plant care. 09/13/22: continues to report intermittent difficulty 07/19/2022: Pt. Continues to be able to water, and care for some  of her plants. Pt. Has more difficulty with plants that are harder to reach. 07/05/2022: pt. Is able to water, and care for some  of her plants. Pt.  Has more difficulty with plants that are harder to reach. 04/07/2022: Pt. is now able to hold a cup and water her plants.02/03/2022: Pt. has difficulty caring for her plants.   Time 12  Period Weeks  Status Ongoing   OT LONG TERM GOAL  #10  TITLE Pt. will demonstrate adaptive techniques to assist with the efficiency of self-dressing, or morning care tasks.  Baseline  09/20/2022: Pt. continues to require assist form personal, and staff care aides. 09/13/22: MOD A dressing, reports she is often rushed 07/19/2022: Pt. continues to require assist with self-dressing/morning care tasks.   07/05/2022: Continue with goal.  04/07/2022: Pt. Continues to require assist with the efficiency of self-dressing, and morning care tasks.02/03/2022: Pt. requires assist from caregivers 2/2 time limitations during morning care.   Time 12  Period Weeks  Status Ongoing  OT LONG TERM GOAL  #11 Title Pt.  will improve BUE strength to improve ADL, and IADL functioning.  Baseline 09/20/2022: Pt. Presents with limited BUE strength 09/13/22: shoulder flexion L 4/5, R 3/5; elbow flexion B 5/5, elbow extension 4-/5; shoulder abduction L 4+/5, R 3+/5. 07/19/2022:  BUE strength continues to be limited. 07/05/2022: shoulder flexion: right 4-/5, abduction: 3+/5, elbow flexion: right: 5/5, left 5/5, extension: right: 4-/5, left 4-/5, wrist extension: right: 3-/5, left: 2-/5  Time 12   Period Weeks   Status Ongoing     Plan - 07/22/21 1737     Clinical Impression Pt. continues to tolerate manual therapy, there. Ex., and ROM. Pt. Presents with tightness at the bilateral thumb webspace, and bilateral wrist flexor tightness, as well as digit MP, PIP, and DIP extensor tightness which continues to limit her ability to complete ADL tasks efficiently. Pt. continues to benefit from working on impoving ROM in order to work towards increasing bilateral hand grasp on objects, and increase engagement of bilateral hands during ADLs, and IADL tasks.       OT Occupational Profile and History Detailed Assessment- Review of Records and additional review of physical, cognitive, psychosocial history related to current functional performance    Occupational performance deficits (Please refer to evaluation for details): ADL's;IADL's;Leisure;Social Participation    Body Structure / Function / Physical Skills  ADL;Continence;Dexterity;Flexibility;Strength;ROM;Balance;Coordination;FMC;IADL;Endurance;UE functional use;Decreased knowledge of use of DME;[facility]    Psychosocial Skills Coping Strategies    Rehab Potential Fair    Clinical Decision Making Several treatment options, min-mod task modification necessary    Comorbidities Affecting Occupational Performance: Presence of comorbidities impacting occupational performance    Comorbidities impacting occupational performance description: contractures of bilateral hands, dependent transfers    Modification or Assistance to Complete Evaluation  No modification of tasks or assist necessary to complete eval    OT Frequency 2x / week    OT Duration 12 weeks    OT Treatment/Interventions Self-care/ADL training;Cryotherapy;Therapeutic exercise;DME and/or AE instruction;Balance training;Neuromuscular education;Manual Therapy;Splinting;Moist Heat;Passive range of motion;Therapeutic activities;Patient/family education    Plan continue to progress ROM of digits, wrists and shoulders as it pertains to completion of ADL tasks that pt values.    Consulted and Agree with Plan of Care Patient         Olegario Messier, MS, OTR/L  09/22/22, 3:12 PM

## 2022-09-29 ENCOUNTER — Non-Acute Institutional Stay (SKILLED_NURSING_FACILITY): Payer: Medicare HMO | Admitting: Student

## 2022-09-29 ENCOUNTER — Encounter: Payer: Self-pay | Admitting: Student

## 2022-09-29 ENCOUNTER — Ambulatory Visit: Payer: Medicare HMO | Admitting: Occupational Therapy

## 2022-09-29 ENCOUNTER — Ambulatory Visit: Payer: Medicare HMO

## 2022-09-29 DIAGNOSIS — Z66 Do not resuscitate: Secondary | ICD-10-CM

## 2022-09-29 DIAGNOSIS — I959 Hypotension, unspecified: Secondary | ICD-10-CM

## 2022-09-29 DIAGNOSIS — G8250 Quadriplegia, unspecified: Secondary | ICD-10-CM | POA: Diagnosis not present

## 2022-09-29 LAB — COMPREHENSIVE METABOLIC PANEL
Albumin: 3.6 (ref 3.5–5.0)
Calcium: 8.6 — AB (ref 8.7–10.7)
Globulin: 2.2
eGFR: 91

## 2022-09-29 LAB — HEPATIC FUNCTION PANEL
ALT: 14 U/L (ref 7–35)
AST: 20 (ref 13–35)
Alkaline Phosphatase: 58 (ref 25–125)
Bilirubin, Total: 0.6

## 2022-09-29 LAB — TSH: TSH: 1.31 (ref 0.41–5.90)

## 2022-09-29 LAB — BASIC METABOLIC PANEL
BUN: 10 (ref 4–21)
CO2: 29 — AB (ref 13–22)
Chloride: 100 (ref 99–108)
Creatinine: 0.5 (ref 0.5–1.1)
Glucose: 107
Potassium: 3.8 meq/L (ref 3.5–5.1)
Sodium: 139 (ref 137–147)

## 2022-09-29 NOTE — Progress Notes (Signed)
Location:  Other [facility] [place].  Nursing Home Room Number: [name] [address] Place of Service:  SNF [phone]) Provider:  Earnestine Mealing, MD  Patient Care Team: Earnestine Mealing, MD as PCP - General (Family Medicine)  Extended Emergency Contact Information Primary Emergency Contact: [name],[name] Mobile Phone: [phone] Relation: Daughter Secondary Emergency Contact: [name] Address: [address]          [place], [place] [address] [name] Home Phone: [phone] Mobile Phone: [phone] Relation: Spouse  Code Status:  Full Code.  Goals of care: Advanced Directive information    09/29/2022    1:41 PM  Advanced Directives  Does Patient Have a Medical Advance Directive? Yes  Type of Estate agent of [place];Out of facility DNR (pink MOST or yellow form);Living will  Does patient want to make changes to medical advance directive? No - Patient declined  Copy of Healthcare Power of Attorney in Chart? Yes - validated most recent copy scanned in chart (See row information)     Chief Complaint  Patient presents with   Acute Visit    Low Blood Pressure.     HPI:  Pt is a 88 y.o. female seen today for an acute visit for low blood pressure. Patient went to nurse because she was "seeing spots." They checked her vital signs and her BP was in the 70s systolic. Denies other symptoms at the time.   Patient previously had a "DNR" in her file and discussed concern for the discrepency. She states, "I've seen so many successes on TV I thought I should change it back to Full Code." "But I don't want my daughter to be stuck trying to make the decision. Let's go ahead and change it back to DNR." Discussed concern she would have high risk for poor outcomes.    Past Medical History:  Diagnosis Date   Acute blood loss anemia    Arthritis    Cancer (HCC)    skin   Central cord syndrome at C6 level of cervical spinal cord (HCC) 11/29/2017    Hypertension    Protein-calorie malnutrition, severe (HCC) 01/24/2018   S/P insertion of IVC (inferior vena caval) filter 01/24/2018   Tetraparesis (HCC)    Past Surgical History:  Procedure Laterality Date   ANTERIOR CERVICAL DECOMP/DISCECTOMY FUSION N/A 11/29/2017   Procedure: Cervical five-six, six-seven Anterior Cervical Decompression Fusion;  Surgeon: Jadene Pierini, MD;  Location: [facility] OR;  Service: Neurosurgery;  Laterality: N/A;  Cervical five-six, six-seven Anterior Cervical Decompression Fusion   CATARACT EXTRACTION     FEMUR IM NAIL Left 08/16/2021   Procedure: INTRAMEDULLARY (IM) NAIL FEMORAL;  Surgeon: Deeann Saint, MD;  Location: [facility] ORS;  Service: Orthopedics;  Laterality: Left;   IR IVC FILTER PLMT / S&I /IMG GUID/MOD SED  12/08/2017    Allergies  Allergen Reactions   Other Swelling   Lisinopril-Hydrochlorothiazide Swelling    Swelling of the tongue   Beef-Derived Products Swelling    Facial and lip swelling (due to tick bite)   Dairy Aid [Tilactase] Swelling   Lisinopril-Hydrochlorothiazide Swelling    Tongue swelling   Pork-Derived Products Swelling    Outpatient Encounter Medications as of 09/29/2022  Medication Sig   acetaminophen (TYLENOL) 500 MG tablet Take 500 mg by mouth every 12 (twelve) hours as needed for mild pain.   acetaminophen (TYLENOL) 650 MG CR tablet Take 650 mg by mouth every 8 (eight) hours as needed for pain.   acetic acid 0.25 % irrigation Use  30ml via irrigation twice daily to keep foley Cath patent.   alendronate (FOSAMAX) 70 MG tablet Take 70 mg by mouth every Saturday.   calcium carbonate (CALCIUM 600) 600 MG TABS tablet Take 600 mg by mouth at bedtime.   Capsaicin 0.1 % CREA Apply 1 application  topically every evening. (Apply to hands)   cetirizine (ZYRTEC) 5 MG tablet Take 5 mg by mouth 2 (two) times daily.   cholecalciferol (VITAMIN D3) 25 MCG (1000 UNIT) tablet Take 1,000 Units by mouth daily.   DULoxetine (CYMBALTA) 60 MG  capsule Take 60 mg by mouth daily.   ferrous sulfate 325 (65 FE) MG tablet Take 325 mg by mouth daily with breakfast.   fluocinonide (LIDEX) 0.05 % external solution Apply 1 Application topically every 12 (twelve) hours as needed.   gabapentin (NEURONTIN) 100 MG capsule Take 1 capsule (100 mg total) by mouth daily.   hydroxypropyl methylcellulose / hypromellose (ISOPTO TEARS / GONIOVISC) 2.5 % ophthalmic solution Place 1 drop into both eyes every hour as needed for dry eyes.   Infant Care Products [facility]) OINT Apply to buttocks/gluteal folds topically every shift.   ketoconazole (NIZORAL) 2 % shampoo Apply 1 Application topically 2 (two) times a week.   lactulose, encephalopathy, (CHRONULAC) 10 GM/15ML SOLN Take 30 mLs by mouth daily as needed (mild constipation).   magnesium citrate solution Take 60-120 mLs by mouth daily as needed (constipation).   methylcellulose oral powder Take 1 packet by mouth at bedtime.   mometasone (ELOCON) 0.1 % lotion Apply 1 Application topically every Saturday at 6 PM. (Apply to the ear canal for eczema)   Multiple Vitamin (MULTIVITAMIN WITH MINERALS) TABS tablet Take 1 tablet by mouth daily.   olopatadine (PATADAY) 0.1 % ophthalmic solution Place 1 drop into both eyes 2 (two) times daily.   polyethylene glycol (MIRALAX / GLYCOLAX) 17 g packet Take 17 g by mouth 2 (two) times daily.   rivaroxaban (XARELTO) 10 MG TABS tablet Take 10 mg by mouth daily.   saccharomyces boulardii (FLORASTOR) 250 MG capsule Take 250 mg by mouth daily.   Saline Spray 0.2 % SOLN Place 2 sprays into both nostrils as needed (nasal congestion).   simethicone (MYLICON) 80 MG chewable tablet Chew 80 mg by mouth every 6 (six) hours as needed for flatulence.   sodium phosphate (FLEET) 7-19 GM/118ML ENEM Place 1 enema rectally every other day as needed for severe constipation.   tiZANidine (ZANAFLEX) 2 MG tablet Take 1 tablet (2 mg total) by mouth 2 (two) times daily.   triamcinolone cream  (KENALOG) 0.1 % Apply to thigh and lower legs topically every 8 hours as needed. Apply to back of neck topically as needed.   Zinc Oxide (TRIPLE PASTE) 12.8 % ointment Apply 1 Application topically. Every shift.   [DISCONTINUED] spironolactone (ALDACTONE) 25 MG tablet Take 1 tablet (25 mg total) by mouth daily.   No facility-administered encounter medications on file as of 09/29/2022.    Review of Systems  Immunization History  Administered Date(s) Administered   Covid-19, Mrna,Vaccine(Spikevax)32yrs and older 05/11/2022   Influenza, High Dose Seasonal PF 11/17/2021   Influenza-Unspecified 11/14/2018, 11/20/2019, 11/17/2020, 11/17/2021   Moderna Covid-19 Vaccine Bivalent Booster 8yrs & up 06/30/2021, 12/11/2021   Moderna Sars-Covid-2 Vaccination 02/09/2019, 03/09/2019, 12/14/2019, 06/20/2020, 10/24/2020   PPD Test 05/16/2019   Tdap 11/29/2017   Pertinent  Health Maintenance Due  Topic Date Due   INFLUENZA VACCINE  09/02/2022   DEXA SCAN  Discontinued  08/19/2021   10:00 AM 08/19/2021    8:54 PM 08/20/2021    8:20 AM 03/29/2022   10:11 AM 08/31/2022    2:42 PM  Fall Risk  Falls in the past year?    0 0  Was there an injury with Fall?    0   Fall Risk Category Calculator    0   (RETIRED) Patient Fall Risk Level High fall risk High fall risk High fall risk    Patient at Risk for Falls Due to    No Fall Risks    Functional Status Survey:    There were no vitals filed for this visit. There is no height or weight on file to calculate BMI. Physical Exam Constitutional:      Comments: In electric wheelchair. Bilateral hand contractures with arm mobility. Left leg in orthotic brace  Cardiovascular:     Rate and Rhythm: Normal rate and regular rhythm.  Pulmonary:     Effort: Pulmonary effort is normal.  Abdominal:     General: Abdomen is flat.     Palpations: Abdomen is soft.  Skin:    General: Skin is warm and dry.  Neurological:     Mental Status: She is oriented to  person, place, and time.     Labs reviewed: Recent Labs    03/08/22 0000 04/21/22 1200 06/02/22 0000  NA 133* 133* 140  K 4.0 3.8 4.6  CL 97* 100 102  CO2 28* 30 31*  GLUCOSE  --  108*  --   BUN 11 8 13   CREATININE 0.6 0.68 0.5  CALCIUM 8.0* 8.0* 9.0   Recent Labs    02/08/22 0000  AST 18  ALT 14  ALKPHOS 65  ALBUMIN 3.7   Recent Labs    02/25/22 0000 03/08/22 0000 04/21/22 1200 04/21/22 1731 04/26/22 0000 05/03/22 0000 05/10/22 0000 05/17/22 0000  WBC 5.1 6.2 7.1  --  5.7  --   --   --   NEUTROABS 2,800.00 3,683.00  --   --  3,283.00  --   --   --   HGB 10.4* 11.9* 8.5*   < > 8.3* 9.3* 10.0* 10.7*  HCT 32* 35* 26.6*   < > 26* 28* 31* 33*  MCV  --   --  99.3  --   --   --   --   --   PLT 318 360 348  --  444*  --   --   --    < > = values in this interval not displayed.   No results found for: "TSH" No results found for: "HGBA1C" No results found for: "CHOL", "HDL", "LDLCALC", "LDLDIRECT", "TRIG", "CHOLHDL"  Significant Diagnostic Results in last 30 days:  No results found.  Assessment/Plan Hypotension, unspecified hypotension type  DNR (do not resuscitate) - Plan: Do not attempt resuscitation (DNR)  Quadriplegia (HCC) Patient with low blood pressure. Unexplained cause. Recovered with hydration and leg elevation. Collect labs to assess for a cause- CBC, CMP, BNP, TSH.   Family/ staff Communication: nursing  Labs/tests ordered:  above

## 2022-09-30 LAB — CBC: RBC: 4.29 (ref 3.87–5.11)

## 2022-09-30 LAB — CBC AND DIFFERENTIAL
HCT: 41 (ref 36–46)
Hemoglobin: 13.2 (ref 12.0–16.0)
Neutrophils Absolute: 2627
Platelets: 280 10*3/uL (ref 150–400)
WBC: 4.7

## 2022-10-01 ENCOUNTER — Encounter: Payer: Self-pay | Admitting: Student

## 2022-10-06 ENCOUNTER — Ambulatory Visit: Payer: Medicare HMO

## 2022-10-06 ENCOUNTER — Ambulatory Visit: Payer: Medicare HMO | Attending: Internal Medicine | Admitting: Occupational Therapy

## 2022-10-06 DIAGNOSIS — R262 Difficulty in walking, not elsewhere classified: Secondary | ICD-10-CM | POA: Insufficient documentation

## 2022-10-06 DIAGNOSIS — R278 Other lack of coordination: Secondary | ICD-10-CM

## 2022-10-06 DIAGNOSIS — M6281 Muscle weakness (generalized): Secondary | ICD-10-CM

## 2022-10-06 DIAGNOSIS — R269 Unspecified abnormalities of gait and mobility: Secondary | ICD-10-CM

## 2022-10-06 DIAGNOSIS — R2681 Unsteadiness on feet: Secondary | ICD-10-CM | POA: Insufficient documentation

## 2022-10-06 DIAGNOSIS — S72001A Fracture of unspecified part of neck of right femur, initial encounter for closed fracture: Secondary | ICD-10-CM

## 2022-10-06 DIAGNOSIS — S14129S Central cord syndrome at unspecified level of cervical spinal cord, sequela: Secondary | ICD-10-CM

## 2022-10-06 DIAGNOSIS — S72002A Fracture of unspecified part of neck of left femur, initial encounter for closed fracture: Secondary | ICD-10-CM | POA: Insufficient documentation

## 2022-10-06 NOTE — Therapy (Signed)
Occupational Therapy Neuro Treatment Note   Patient Name: Sherry [name] MRN: [id number] DOB:[birth year], 88 y.o., female Today's Date: 09/20/2022                                 PCP: Dr. Tillman Abide REFERRING PROVIDER: Dr. Earnestine Mealing   OT End of Session - 10/06/22 1227     Visit Number [id number]    Number of Visits 96    Date for OT Re-Evaluation 12/13/22    Authorization Time Period Progress report period starting 0812/2024    OT Start Time 1105    OT Stop Time 1145    OT Time Calculation (min) 40 min    Equipment Utilized During Treatment powered wheelchair    Activity Tolerance Patient tolerated treatment well    Behavior During Therapy WFL for tasks assessed/performed               Past Medical History:  Diagnosis Date   Acute blood loss anemia    Arthritis    Cancer (HCC)    skin   Central cord syndrome at C6 level of cervical spinal cord (HCC) 11/29/2017   Hypertension    Protein-calorie malnutrition, severe (HCC) 01/24/2018   S/P insertion of IVC (inferior vena caval) filter 01/24/2018   Tetraparesis (HCC)    Past Surgical History:  Procedure Laterality Date   ANTERIOR CERVICAL DECOMP/DISCECTOMY FUSION N/A 11/29/2017   Procedure: Cervical five-six, six-seven Anterior Cervical Decompression Fusion;  Surgeon: Jadene Pierini, MD;  Location: [facility] OR;  Service: Neurosurgery;  Laterality: N/A;  Cervical five-six, six-seven Anterior Cervical Decompression Fusion   CATARACT EXTRACTION     FEMUR IM NAIL Left 08/16/2021   Procedure: INTRAMEDULLARY (IM) NAIL FEMORAL;  Surgeon: Deeann Saint, MD;  Location: [facility] ORS;  Service: Orthopedics;  Laterality: Left;   IR IVC FILTER PLMT / S&I /IMG GUID/MOD SED  12/08/2017   Patient Active Problem List   Diagnosis Date Noted   Age-related osteoporosis without current pathological fracture 07/27/2022   Mild recurrent major depression (HCC) 07/27/2022   Hypertensive heart disease with other congestive heart failure (HCC)  02/14/2022   Chronic indwelling Foley catheter 02/14/2022   Closed hip fracture (HCC) 08/15/2021   DVT (deep venous thrombosis) (HCC) 08/15/2021   Quadriplegia (HCC) 08/15/2021   Fall 08/15/2021   Constipation due to slow transit 08/31/2018   Trauma 06/05/2018   Neuropathic pain 06/05/2018   Neurogenic bowel 06/05/2018   Vaginal yeast infection 01/30/2018   Healthcare-associated pneumonia 01/25/2018   Chronic allergic rhinitis 01/24/2018   Depression with anxiety 01/24/2018   UTI due to Klebsiella species 01/24/2018   Protein-calorie malnutrition, severe (HCC) 01/24/2018   S/P insertion of IVC (inferior vena caval) filter 01/24/2018   Tetraparesis (HCC) 01/20/2018   Neurogenic bladder 01/20/2018   Reactive depression    Benign essential HTN    Acute postoperative anemia due to expected blood loss    Central cord syndrome at C6 level of cervical spinal cord (HCC) 11/29/2017   Allergy to alpha-gal 11/25/2016   SCC (squamous cell carcinoma) 04/08/2014    REFERRING DIAG: Central Cord Syndrome at C6 of the Cervical Spinal Cord, Fall with Bilateral Closed Hip Fractures, with ORIF repair with Intramedullary Nailing of the Left Hip Fracture.  THERAPY DIAG:   Muscle weakness (generalized)  Other lack of coordination  Rationale for Evaluation and Treatment Rehabilitation  PERTINENT HISTORY: Patient s/p fall November 29, 2017 resulting in diagnosis  of central cord syndrome at C 6 level.  She has had therapy in multiple venues but with recent move to [facility], therapy staff recommended she seek outpatient therapy for her needs. Pt. Had a recent fall with Bilateral Closed Hip Fractures, with ORIF repair with Intramedullary Nailing of the Left Hip Fracture.   PRECAUTIONS: None  SUBJECTIVE:   Pt. reports having had an episode of Low BP last week.  PAIN: Are you having pain No  OBJECTIVE:    Today's Treatment:    Manual Therapy:   Pt. tolerated soft tissue massage to the volar,  and dorsal surface of each digit on the bilateral hands, and thenar eminence secondary to stiffness following moist heat modality. Soft tissue mobilization was performed for Carpal, and metacarpal spread stretches. Manual therapy was performed independent of, and in preparation for therapeutic Ex.   Therapeutic Ex.    Pt. tolerated PROM followed by AROM bilateral wrist extension, PROM for bilateral digit MP, PIP, and DIP flexion, and extension, thumb radial, and palmar abduction. Emphasis was placed on left wrist extension with moist heat modality 2/2 tightness. Pt. performed 2.5# dowel ex. For UE strengthening secondary to weakness. Bilateral shoulder flexion, chest press, circular patterns, and elbow flexion/extension were performed.     PATIENT EDUCATION: RUE Distal ROM, and strength Person educated: Patient Education method: Explanation and Demonstration Education comprehension: verbalized understanding, returned demonstration, and needs further education   HOME EXERCISE PROGRAM Continue with ongoing HEPs  Measurements:  12/09/2021:  Shoulder flexion: R: 120(136), L: 138(150) Shoulder abduction: R: 108(120), L: 110(120) Elbow: R: 0-148, L: 0-146 Wrist flexion: R: 55(62), L: 78 Writ extension: R: 34(55), L: 3(4) RD: R: 12(20), L: 16(26) UD: R: 8(24), L: 6(18) Thumb radial abduction: R: 11(20), L: 8(20) Digit flexion to the [facility]: R:  2nd: 5cm(3.5cm), 3rd: 7.5 cm(5cm), 4th: 7cm(3cm), 5th: 6cm(5.5cm) L: 2nd: 8cm(8cm), 3rd: 8cm(8cm), 4th: 7.5cm(7cm), 5th: 7cm(7cm)  02/03/2022:   Shoulder flexion: R: 122(138), L: 148(154) Shoulder abduction: R: 109(125), L: 125(125 Elbow: R: 0-148, L: 0-146 Wrist flexion: R: 60(68), L: 79 Writ extension: R: 40(55), L: 3(10) RD: R: 14(24), L: 20(28) UD: R: 8(24), L: 6(18) Thumb radial abduction: R: 20(25), L: 8(20) Digit flexion to the [facility]: R:  2nd: 4.5cm(3cm), 3rd: 6.5cm(4.5cm), 4th: 6.5 cm(3cm), 5th: 4.5cm(5cm) L: 2nd: 8cm(8cm), 3rd:  8cm(7cm), 4th: 7.5cm(7cm), 5th: 6.5cm(6.5cm)   04/07/2022:   Shoulder flexion: R: 125(150), L: 160(160) Shoulder abduction: R: 109(125), L: 125(125 Elbow: R: 0-148, L: 0-146 Wrist flexion: R: 60(68), L: 79 Writ extension: R: 40(55), L: 5(18) RD: R: 14(24), L: 20(28) UD: R: 18(24), L: 8(18) Thumb radial abduction: R: 20(25), L: 8(20) Digit flexion to the [facility]: R:  2nd: 4.5 cm(4 cm), 3rd: 6.5cm(3cm), 4th: 7 cm (5cm), 5th: 5cm(5cm) L: 2nd: 8cm(8cm), 3rd: 8cm(7cm), 4th: 7.5cm(7cm), 5th: 7cm(7cm)   07/05/2022:   Shoulder flexion: R: 125(154), L: 160(160) Shoulder abduction: R: 95(130), L: 143(143) Elbow: R: 0-148, L: 0-146 Wrist flexion: R: 60(68), L: 79 Writ extension: R: 40(60), L: 0(12) RD: R: 10(24), L: 12(20) UD: R: 16(24), L: 8(16) Thumb radial abduction: R: 20(25), L: 8(20) Digit flexion to the [facility]: R:  2nd: 4 cm(4cm), 3rd: 6.5cm( 3.5cm), 4th: 7 cm (5cm), 5th: 6cm(5cm) L: 2nd: 8cm(8cm), 3rd: 8cm(7cm), 4th: 8cm(7cm), 5th: 7cm(7cm)  09/13/2022:   Shoulder flexion: R: 125(154), L: 160(160) Shoulder abduction: R: 100(130), L: 143(143) Elbow: R: 0-150, L: 0-150 Wrist flexion: R: 55(68), L: 79 Writ extension: R: 45(60), L: 0(12) RD: R: 10(24), L: 12(20)  UD: R: 16(24), L: 8(16) Thumb radial abduction: R: 20(25), L: 8(20) Digit flexion to the [facility]: R:  2nd: 4 cm(4cm), 3rd: 6cm( 5cm), 4th: 6 cm (4cm), 5th: 5cm(5cm) L: 2nd: 7cm(cm), 3rd: 7cm(6cm), 4th: 7cm(6cm), 5th: 6cm(6cm)      OT Long Term Goals - 07/06/21 1106                                                                                          Target Date: 12/13/2022      OT LONG TERM GOAL #1   Title Patient and caregiver will demonstrate understanding of home exercise program for ROM.    Baseline 12/09/2021: Pt. Reports the restorative ROM program has not resumed at the facility. 10/28/2021: Pt. Is attempting exercises/stretches at home. The facility restorative aides have not resumed exercises. 09/14/2021:  Pt.  Reports inconsistencies with restorative ROM at  her care home. Pt/caregiver education to be provided about ongoing ROM needs.    Time 12    Period Weeks    Status Deferred     OT LONG TERM GOAL #2   Title Pt.  will improve BUE strength to be able to sustain her UE's in elevation to efficiently, and effectively perform hair care, and self-grooming tasks.   Baseline 02/03/2022: Pt. has improved, and is now able to sustain her Bilateral UEs in elevation long enough to efficiently, and effectively perform haircare, and self-grooming tasks. 12/09/2021: right shoulder 4-/5, left shoulder 4/5. Pt. has improved to be able to reach up for hair care, and self-grooming tasks. 10/28/2021: Right shoulder 3+/5, Left shoulder 4-/5 09/14/2021:  BUE shoulder strength: 3+/5 for shoulder flexion, and abduction.   Time 12    Period Weeks    Status Achieved               OT LONG TERM GOAL #3   Title Patient will demonstrate improved composite finger flexion to be able to firmly hold  and use adaptive devices during ADLs, and IADLs.   Baseline 09/20/2022: Improving digit flexion to the [facility]. 09/13/22: improved digit flexion to the [facility]. 07/05/2022: Pt. presents with digit MP, PIP, and DIP extensor tightness limiting her ability to securely grip objects in her bilateral hands.  04/07/2022: Pt. Has improved with right 2nd, and 3rd digit flexion towards the [facility]. Pt. Continues to have difficulty securely holding and applying deodorant.02/03/2022: Pt. Has improved with digit flexion, however continues to have difficulty securely holding and using deoderant. 12/09/2021: Bilateral hand/digit MP, PIP, and DIP extension tightness limits her ability achieve digit flexion to hold, and apply deodorant. 10/28/2021:  Pt. continues to have difficulty holding the deodorant. 01/12/2021: Pt. Presents with limited digit extension. Pt. Is able to initiate holding deodorant, however is unable to hold it while using it.   Time 12    Period Weeks     Status Revised   Target Date                  OT LONG TERM GOAL #4   Title Pt. Will improve bilateral wrist extension in preparation for anticipating, and initiating reaching for objects at the table.  Baseline 09/20/2022: limited PROM in the Left wrist extension 2/2 tightness/stiffness. 09/13/22: R: 55(68), L: 79 07/19/2022: Bilateral wrist extension in limited. 07/05/2022: Right: 40(60) Left: 0(12) 04/07/2022: Rght: 40(55) Left: 5(18) 02/03/2022: Right: 34(55) Left: 3(4) 12/09/2021: Right  34(55)  Left  3(4) 10/28/2021:  Right 22(38), Left 0(15) 09/14/2021:  Right: 17(35), left 2(15)   Time 12   Period weeks   Status Ongoing   Target Date      OT LONG TERM GOAL #5   Title Pt. will write, and sign her name with 100% legibility, and modified independence.    Baseline 09/13/22: 100% legibility to sign name, intermittent tremors 07/19/2022:  Pt. continues to be able to consistently fill out daily meal menus, and puzzles. 6/03/204: Pt. Is consistently filling out  daily meal menus, and puzzles. 04/07/2022: Pt. Reports that she continues to present with shaky appearing writing. Pt.reports that she is writing more when filling out her menu, and complete puzzles 02/03/2022: Pt. Continues to report legibility appears "shaky" when attempting signing her name. Pt.reports that she is writing more when filling out her menu, and complete puzzles.12/09/2021: Pt. Reports legibility appears "shaky" when attempting signing her name. Pt.reports that she is writing more when filling out her menu. 10/28/2021: pt. is able to sign her name, however legibility continues to be inconsistent. Pt. Reports "shaky appearance". 09/14/2021: Pt. continues to fill out her daily menu and complete puzzles. Pt. Continues to present with limited writing legibility.   Time 12    Period Weeks    Status  Achieved         OT LONG TERM GOAL  #6   TITLE Pt. will turn pages in a book with modified independence.    Baseline 02/03/2022: Pt. Is able  to effectively turn pages of a book. 10/28/2021: Pt. Is able to consistently turn book pages. 09/14/2021: Pt. has difficulty turning pages.    Time 12    Period Weeks    Status Achieved       OT LONG TERM GOAL  #7  TITLE Pt. Will increase  bilateral lateral pinch strength by 2 lbs to be able to securely grasp items during ADLs, and IADL tasks.   Baseline 09/20/2022: lateral pinch continues to be limited 09/13/22: R 6.5# L 4# 07/19/2022: TBD 07/05/2022: TBD 07/2022: NT-Pinch meter out for calibration. 02/03/2022: Right: 6#, Left: 4# 12/09/2021:  Right: 6#, Left: 4#   Time 12  Period Weeks  Status Ongoing           OT LONG TERM GOAL  #8  TITLE Pt. Will button a sweater with large 1" buttons, and zip her jacket with modified independence.   Baseline 06/07/2022: Pt. is able to use a button hook to button a sweater. Pt. Reports that she does not wear button down clothing often.  04/07/2022: Pt. Has difficulty manipulating buttons, however was introduced to a buttonhook. Pt. Was able to hold, and use a buttonhook for buttons on a shirt. 02/03/2022: Pt. is unable to manipulate buttons on her sweater, or zippers on her jacket.  Time 12  Period Weeks  Status Achieved   OT LONG TERM GOAL  #9  TITLE Pt. will complete plant care with modified independence.  Baseline 09/20/2022: Pt. Continues to have difficulty reaching for thorough plant care. 09/13/22: continues to report intermittent difficulty 07/19/2022: Pt. Continues to be able to water, and care for some  of her plants. Pt. Has more difficulty with plants that are harder to reach. 07/05/2022: pt.  Is able to water, and care for some  of her plants. Pt. Has more difficulty with plants that are harder to reach. 04/07/2022: Pt. is now able to hold a cup and water her plants.02/03/2022: Pt. has difficulty caring for her plants.   Time 12  Period Weeks  Status Ongoing   OT LONG TERM GOAL  #10  TITLE Pt. will demonstrate adaptive techniques to assist with the  efficiency of self-dressing, or morning care tasks.  Baseline 09/20/2022: Pt. continues to require assist form personal, and staff care aides. 09/13/22: MOD A dressing, reports she is often rushed 07/19/2022: Pt. continues to require assist with self-dressing/morning care tasks.   07/05/2022: Continue with goal.  04/07/2022: Pt. Continues to require assist with the efficiency of self-dressing, and morning care tasks.02/03/2022: Pt. requires assist from caregivers 2/2 time limitations during morning care.   Time 12  Period Weeks  Status Ongoing  OT LONG TERM GOAL  #11 Title Pt.  will improve BUE strength to improve ADL, and IADL functioning.  Baseline 09/20/2022: Pt. Presents with limited BUE strength 09/13/22: shoulder flexion L 4/5, R 3/5; elbow flexion B 5/5, elbow extension 4-/5; shoulder abduction L 4+/5, R 3+/5. 07/19/2022:  BUE strength continues to be limited. 07/05/2022: shoulder flexion: right 4-/5, abduction: 3+/5, elbow flexion: right: 5/5, left 5/5, extension: right: 4-/5, left 4-/5, wrist extension: right: 3-/5, left: 2-/5  Time 12   Period Weeks   Status Ongoing     Plan - 07/22/21 1737     Clinical Impression Pt. continues to tolerate manual therapy, there. Ex., and ROM. Pt. Presents with tightness at the bilateral thumb webspace, and bilateral wrist flexor tightness, as well as digit MP, PIP, and DIP extensor tightness which continues to limit her ability to complete ADL tasks efficiently. Pt. continues to benefit from working on impoving ROM in order to work towards increasing bilateral hand grasp on objects, and increase engagement of bilateral hands during ADLs, and IADL tasks.       OT Occupational Profile and History Detailed Assessment- Review of Records and additional review of physical, cognitive, psychosocial history related to current functional performance    Occupational performance deficits (Please refer to evaluation for details): ADL's;IADL's;Leisure;Social Participation     Body Structure / Function / Physical Skills ADL;Continence;Dexterity;Flexibility;Strength;ROM;Balance;Coordination;FMC;IADL;Endurance;UE functional use;Decreased knowledge of use of DME;[facility]    Psychosocial Skills Coping Strategies    Rehab Potential Fair    Clinical Decision Making Several treatment options, min-mod task modification necessary    Comorbidities Affecting Occupational Performance: Presence of comorbidities impacting occupational performance    Comorbidities impacting occupational performance description: contractures of bilateral hands, dependent transfers    Modification or Assistance to Complete Evaluation  No modification of tasks or assist necessary to complete eval    OT Frequency 2x / week    OT Duration 12 weeks    OT Treatment/Interventions Self-care/ADL training;Cryotherapy;Therapeutic exercise;DME and/or AE instruction;Balance training;Neuromuscular education;Manual Therapy;Splinting;Moist Heat;Passive range of motion;Therapeutic activities;Patient/family education    Plan continue to progress ROM of digits, wrists and shoulders as it pertains to completion of ADL tasks that pt values.    Consulted and Agree with Plan of Care Patient         Olegario Messier, MS, OTR/L  10/06/22, 3:12 PM

## 2022-10-06 NOTE — Therapy (Signed)
OUTPATIENT PHYSICAL THERAPY NEURO TREATMENT/RECERT   Patient Name: Sherry [name] MRN: [id number] DOB:09-Jul-1934, 88 y.o., female Today's Date: 10/06/2022   PCP: Sherry Mealing, MD REFERRING PROVIDER: Deeann [name]   PT End of Session - 10/06/22 1156     Visit Number 68    Number of Visits 92    Date for PT Re-Evaluation 12/29/22    Progress Note Due on Visit 80    PT Start Time 1146    PT Stop Time 1228    PT Time Calculation (min) 42 min    Equipment Utilized During Treatment --    Activity Tolerance Patient tolerated treatment well;No increased pain;Patient limited by fatigue    Behavior During Therapy [name] for tasks assessed/performed              Past Medical History:  Diagnosis Date   Acute blood loss anemia    Arthritis    Cancer (HCC)    skin   Central cord syndrome at C6 level of cervical spinal cord (HCC) 11/29/2017   Hypertension    Protein-calorie malnutrition, severe (HCC) 01/24/2018   S/P insertion of IVC (inferior vena caval) filter 01/24/2018   Tetraparesis (HCC)    Past Surgical History:  Procedure Laterality Date   ANTERIOR CERVICAL DECOMP/DISCECTOMY FUSION N/A 11/29/2017   Procedure: Cervical five-six, six-seven Anterior Cervical Decompression Fusion;  Surgeon: Sherry Pierini, MD;  Location: [facility];  Service: Neurosurgery;  Laterality: N/A;  Cervical five-six, six-seven Anterior Cervical Decompression Fusion   CATARACT EXTRACTION     FEMUR IM NAIL Left 08/16/2021   Procedure: INTRAMEDULLARY (IM) NAIL FEMORAL;  Surgeon: Sherry Saint, MD;  Location: [facility] ORS;  Service: Orthopedics;  Laterality: Left;   IR IVC FILTER PLMT / S&I /IMG GUID/MOD SED  12/08/2017   Patient Active Problem List   Diagnosis Date Noted   Age-related osteoporosis without current pathological fracture 07/27/2022   Mild recurrent major depression (HCC) 07/27/2022   Hypertensive heart disease with other congestive heart failure (HCC) 02/14/2022   Chronic indwelling  Foley catheter 02/14/2022   Closed hip fracture (HCC) 08/15/2021   DVT (deep venous thrombosis) (HCC) 08/15/2021   Quadriplegia (HCC) 08/15/2021   Fall 08/15/2021   Constipation due to slow transit 08/31/2018   Trauma 06/05/2018   Neuropathic pain 06/05/2018   Neurogenic bowel 06/05/2018   Vaginal yeast infection 01/30/2018   Healthcare-associated pneumonia 01/25/2018   Chronic allergic rhinitis 01/24/2018   Depression with anxiety 01/24/2018   UTI due to Klebsiella species 01/24/2018   Protein-calorie malnutrition, severe (HCC) 01/24/2018   S/P insertion of IVC (inferior vena caval) filter 01/24/2018   Tetraparesis (HCC) 01/20/2018   Neurogenic bladder 01/20/2018   Reactive depression    Benign essential HTN    Acute postoperative anemia due to expected blood loss    Central cord syndrome at C6 level of cervical spinal cord (HCC) 11/29/2017   Allergy to alpha-gal 11/25/2016   SCC (squamous cell carcinoma) 04/08/2014    ONSET DATE: 08/15/2021 (fall with B hip fx); Initial injury was in 2019- quadraplegia due to central cord syndrome of C6 secondary to neck fracture.   REFERRING DIAG: Bilateral Hip fx; Left Open- s/p Left IM nail femoral on 08/16/2021; Right closed  THERAPY DIAG:  Muscle weakness (generalized)  Other lack of coordination  Central cord syndrome, sequela (HCC)  Abnormality of gait and mobility  Difficulty in walking, not elsewhere classified  Closed fracture of both hips, initial encounter [name])  Rationale for Evaluation and Treatment Rehabilitation  SUBJECTIVE:   SUBJECTIVE STATEMENT:  Patient reports not feeling well today- Had some low blood pressure issues last week and some dizziness earlier but just not up to par.     Pt accompanied by:  Sherry [name]-   PERTINENT HISTORY: Sherry [name] is an 87yoF who experienced a fall at home on 08/15/2021 with hip fracture, s/p Left hip ORIF. PMH: quadriplegia due to central cord syndrome of C6 secondary  to neck fracture, HTN, depression with anxiety, neurogenic bladder, bilateral DVT on Xarelto (s/p of IVC filter placement). Prior history of significant PT/OT with improvement of function. Pt has full use of shoulder/elbows, limited use of hands, adaptive self feeding sucessful.     PAIN:  None   OBJECTIVE:    TODAY'S TREATMENT:  10/06/22    BP = 108/67 mmHg Left UE at rest - no c/o dizziness   THEREX -LAQ 2x12 reps each LE -Seated Hip flex 2 x 12 reps -Seated hip abd (AAROM) 2 x 12 reps -Seated hip add (ball squeeze) hold 5 sec 2 sets of 10 reps -Seated manual resistive Leg press 2 sets of 10 reps  Physical therapy treatment session today consisted of completing assessment of goals and administration of testing as demonstrated and documented in flow sheet, treatment, and goals section of this note. Addition treatments may be found below.     PATIENT EDUCATION: Education details: cues for isolated joint exercises  HOME EXERCISE PROGRAM: No changes at this time  Access Code: [name] URL: [email or web address] Date: 11/23/2021 Prepared by: Precious Bard Exercises - Seated Gluteal Sets - 2 x daily - 7 x weekly - 2 sets - 10 reps - 5 hold - Seated Quad Set - 2 x daily - 7 x weekly - 2 sets - 10 reps - 5 hold - Seated Long Arc Quad - 2 x daily - 7 x weekly - 2 sets - 10 reps - 5 hold - Seated March - 2 x daily - 7 x weekly - 2 sets - 10 reps - 5 hold - Seated Hip Abduction - 2 x daily - 7 x weekly - 2 sets - 10 reps - 5 hold - Seated Shoulder Shrugs - 2 x daily - 7 x weekly - 2 sets - 10 reps - 5 hold - Wheelchair Pressure Relief - 2 x daily - 7 x weekly - 2 sets - 10 reps - 5 hold  GOALS: Goals reviewed with patient? Yes  SHORT TERM GOALS: Target date: 02/22/2022  Pt will be independent with initial UE strengthening HEP in order to improve strength and balance in order to decrease fall risk and improve function at home and work. Baseline: 10/19/2021- patient with no  formal UE HEP; 12/02/2021= Patient verbalized knowledge of HEP including use of theraband for UE strengthening.  Goal status: GOAL MET  LONG TERM GOALS: Target date: 12/29/2022  Pt will be independent with final for UE/LE HEP in order to improve strength and balance in order to decrease fall risk and improve function at home and work. Baseline: Patient is currently BLE NWB and unable to participate in HEP. Has order for UE strengthening. 01/11/2022- Patient now able to participate in LE strengthening although NWB still- good understanding for some basic exercises. Will keep goal active to incorporate progressive LE strengthening exercises. 04/07/2022= Patient still participating in progressive LE seated HEP and has no questions at this time.  Goal status: MET  2.  Pt will improve FOTO to target score of 35  to display  perceived improvements in ability to complete ADL's.  Baseline: 10/19/2021= 12; 12/02/2021= 17; 01/11/2022=17; 04/07/2022= 17; 07/05/2022=17; 10/06/2022- outcome measure not appropriate as patient in non-ambulatory and not consistently standing. Goal status: Goal not appropriate  3.  Pt will increase strength of B UE  by at least 1/2 MMT grade in order to demonstrate improvement in strength and function  Baseline: patient has range of 2-/5 to 4/5 BUE Strength; 12/02/2021= 4/5 except with wrist ext. 01/11/2022= 4/5 B UE strength expept for wrist Goal status: Goal revised-no longer appropriate- working with OT on all UE strengthening.   4. Pt. Will increase strength of RLE by at least 1/2 MMT grade in order to demonstrate improvement in Standing/transfers.  Baseline: 2-/5 Right hip flex/knee ext/flex; 04/07/2022= 2- with right hip flex/knee ext (lacking 28 deg from zero) 4/8= Patient able to ext right knee lacking 18 deg from zero. 07/05/2022=Left knee approx 8 deg from zero and right knee = 23 deg from zero  Goal status: Progressing  5. Pt. Will demo ability to stand pivot transfer with max assist  for improved functional mobility and less dependent need on mechanical device.   Baseline: Dependent on hoyer lift for all transfers; 04/07/2022- Unable to test secondary to patient with recent UTI and right hand procedure - will attempt to reassess next visit. ; 04/12/22: Unable to successfully stand due to feet plates on loaned power w/c; 05/10/2022- Patient still having to use rental chair and has not received her original power w/c back to practice SPT. 07/04/2021= Patient just received new power w/c and attempted standing from 1st time today- Patient able to stand with max assist today from w/c and did not attempt to perform pivot transfer- difficulty with static standing and placing weight on right LE. 10/06/2022- Not assessed today- patient needs new AFO prior to attempting transfers  Goal status: ONGOING  6. Pt. Will demonstrate improved functional LE strength as seen by ability to stand > 2 min for improved transfer ability and pregait abilities.   Baseline: not assessed today due to recent dx: UTI; 04/07/2022- Unable to test secondary to patient with recent UTI and right hand procedure - will attempt to reassess next visit; 04/12/22: Unable to successfully stand due to feet plates on loaned power w/c 05/10/2022- Patient still having to use rental chair and has not received her original power w/c back to practice Standing. 07/05/2022- Patient able to stand with max assist at trunk today for 2 min 3 sec. Will keep goal active to ensure consistency. 10/06/2022= Unable to assess today- patient with some recent blood pressure issues and needs new AFO.   Goal status: ONGOING  ASSESSMENT:  CLINICAL IMPRESSION: Patient has been limited this certification with some missed visits, issues with AFO slipping, UTI's. She remains motivated with primary goal of standing/transferring. During this cert she did resume standing and was demonstrating improving posture. She will need a new AFO/shoe to improve standing ability and  prevent contractures. Patient's condition has the potential to improve in response to therapy. Maximum improvement is yet to be obtained. The anticipated improvement is attainable and reasonable in a generally predictable time.  Patient reports  . Pt will continue to benefit from skilled PT services to optimize independence and reduced [name] support.    OBJECTIVE IMPAIRMENTS Abnormal gait, decreased activity tolerance, decreased balance, decreased coordination, decreased endurance, decreased mobility, difficulty walking, decreased ROM, decreased strength, hypomobility, impaired flexibility, impaired UE functional use, postural dysfunction, and pain.   ACTIVITY LIMITATIONS carrying, lifting, bending,  sitting, standing, squatting, sleeping, stairs, transfers, bed mobility, continence, bathing, toileting, dressing, self feeding, reach over head, hygiene/grooming, and caring for others  PARTICIPATION LIMITATIONS: meal prep, cleaning, laundry, medication management, personal finances, interpersonal relationship, driving, shopping, community activity, and yard work  PERSONAL FACTORS Age, Time since onset of injury/illness/exacerbation, and 1-2 comorbidities: HTN, cervical Sx  are also affecting patient's functional outcome.   REHAB POTENTIAL: Good  CLINICAL DECISION MAKING: Evolving/moderate complexity  EVALUATION COMPLEXITY: Moderate  PLAN: PT FREQUENCY: 1-2x/week  PT DURATION: 12 weeks  PLANNED INTERVENTIONS: Therapeutic exercises, Therapeutic activity, Neuromuscular re-education, Balance training, Gait training, Patient/Family education, Self Care, Joint mobilization, DME instructions, Dry Needling, Electrical stimulation, Wheelchair mobility training, Spinal mobilization, Cryotherapy, Moist heat, and Manual therapy  PLAN FOR NEXT SESSION: strength, cortraining, motor control postural righting performance endurance   12:43 PM, 10/06/22 Louis Meckel, PT Physical Therapist - [name]  Outpatient Physical Therapy- Main [facility] [phone]

## 2022-10-07 ENCOUNTER — Encounter: Payer: Self-pay | Admitting: Nurse Practitioner

## 2022-10-07 ENCOUNTER — Non-Acute Institutional Stay (SKILLED_NURSING_FACILITY): Payer: Medicare HMO | Admitting: Nurse Practitioner

## 2022-10-07 DIAGNOSIS — R600 Localized edema: Secondary | ICD-10-CM | POA: Diagnosis not present

## 2022-10-07 MED ORDER — SPIRONOLACTONE 25 MG PO TABS: 12.5000 mg | ORAL_TABLET | Freq: Every day | ORAL | Status: DC

## 2022-10-07 NOTE — Progress Notes (Signed)
Location:  Other [place].  Nursing Home Room Number: [facility] [address] Place of Service:  SNF ([phone]) Abbey Chatters, NP  PCP: Earnestine Mealing, MD  Patient Care Team: Earnestine Mealing, MD as PCP - General (Family Medicine)  Extended Emergency Contact Information Primary Emergency Contact: [name],[name] Mobile Phone: [phone] Relation: Daughter Secondary Emergency Contact: Suhail, Deanda Address: [address]          [place], [place] [address] [place] of [place] Home Phone: [phone] Mobile Phone: [phone] Relation: Spouse  Goals of care: Advanced Directive information    10/07/2022   10:10 AM  Advanced Directives  Does Patient Have a Medical Advance Directive? Yes  Type of Estate agent of [place];Out of facility DNR (pink MOST or yellow form);Living will  Does patient want to make changes to medical advance directive? No - Patient declined  Copy of Healthcare Power of Attorney in Chart? Yes - validated most recent copy scanned in chart (See row information)     Chief Complaint  Patient presents with   Acute Visit    Leg Swelling.     HPI:  Pt is a 88 y.o. female seen today for an acute visit for Leg Swelling.  Pt had requested aldactone to be stopped due to feeling like medication was drying her up too much. Since she stopped medication legs have become more edematous and weight gain noted.  Pt denies worsening shortness of breath, cough or congestion. Denies trouble urinating or less urination.  Would like medication restarted due to leg swelling.   Past Medical History:  Diagnosis Date   Acute blood loss anemia    Arthritis    Cancer (HCC)    skin   Central cord syndrome at C6 level of cervical spinal cord (HCC) 11/29/2017   Hypertension    Protein-calorie malnutrition, severe (HCC) 01/24/2018   S/P insertion of IVC (inferior vena caval) filter 01/24/2018   Tetraparesis (HCC)    Past Surgical History:  Procedure  Laterality Date   ANTERIOR CERVICAL DECOMP/DISCECTOMY FUSION N/A 11/29/2017   Procedure: Cervical five-six, six-seven Anterior Cervical Decompression Fusion;  Surgeon: Jadene Pierini, MD;  Location: [facility] OR;  Service: Neurosurgery;  Laterality: N/A;  Cervical five-six, six-seven Anterior Cervical Decompression Fusion   CATARACT EXTRACTION     FEMUR IM NAIL Left 08/16/2021   Procedure: INTRAMEDULLARY (IM) NAIL FEMORAL;  Surgeon: Deeann Saint, MD;  Location: [facility] ORS;  Service: Orthopedics;  Laterality: Left;   IR IVC FILTER PLMT / S&I /IMG GUID/MOD SED  12/08/2017    Allergies  Allergen Reactions   Other Swelling   Lisinopril-Hydrochlorothiazide Swelling    Swelling of the tongue   Beef-Derived Products Swelling    Facial and lip swelling (due to tick bite)   Dairy Aid [Tilactase] Swelling   Lisinopril-Hydrochlorothiazide Swelling    Tongue swelling   Pork-Derived Products Swelling    Outpatient Encounter Medications as of 10/07/2022  Medication Sig   acetaminophen (TYLENOL) 500 MG tablet Take 500 mg by mouth every 12 (twelve) hours as needed for mild pain.   acetaminophen (TYLENOL) 650 MG CR tablet Take 650 mg by mouth every 8 (eight) hours as needed for pain.   acetic acid 0.25 % irrigation Use 30ml via irrigation twice daily to keep foley Cath patent.   alendronate (FOSAMAX) 70 MG tablet Take 70 mg by mouth every Saturday.   calcium carbonate (CALCIUM 600) 600 MG TABS tablet Take 600 mg by mouth at bedtime.   Capsaicin 0.1 %  CREA Apply 1 application  topically every evening. (Apply to hands)   cetirizine (ZYRTEC) 5 MG tablet Take 5 mg by mouth 2 (two) times daily.   cholecalciferol (VITAMIN D3) 25 MCG (1000 UNIT) tablet Take 1,000 Units by mouth daily.   DULoxetine (CYMBALTA) 60 MG capsule Take 60 mg by mouth daily.   ferrous sulfate 325 (65 FE) MG tablet Take 325 mg by mouth daily with breakfast.   fluocinonide (LIDEX) 0.05 % external solution Apply 1 Application topically every  12 (twelve) hours as needed.   gabapentin (NEURONTIN) 100 MG capsule Take 1 capsule (100 mg total) by mouth daily.   hydroxypropyl methylcellulose / hypromellose (ISOPTO TEARS / GONIOVISC) 2.5 % ophthalmic solution Place 1 drop into both eyes every hour as needed for dry eyes.   Infant Care Products [employer]) OINT Apply to buttocks/gluteal folds topically every shift.   ketoconazole (NIZORAL) 2 % shampoo Apply 1 Application topically 2 (two) times a week.   lactulose, encephalopathy, (CHRONULAC) 10 GM/15ML SOLN Take 30 mLs by mouth daily as needed (mild constipation).   magnesium citrate solution Take 60-120 mLs by mouth daily as needed (constipation).   methylcellulose oral powder Take 1 packet by mouth at bedtime.   mometasone (ELOCON) 0.1 % lotion Apply 1 Application topically every Saturday at 6 PM. (Apply to the ear canal for eczema)   Multiple Vitamin (MULTIVITAMIN WITH MINERALS) TABS tablet Take 1 tablet by mouth daily.   olopatadine (PATADAY) 0.1 % ophthalmic solution Place 1 drop into both eyes 2 (two) times daily.   polyethylene glycol (MIRALAX / GLYCOLAX) 17 g packet Take 17 g by mouth 2 (two) times daily.   rivaroxaban (XARELTO) 10 MG TABS tablet Take 10 mg by mouth daily.   saccharomyces boulardii (FLORASTOR) 250 MG capsule Take 250 mg by mouth daily.   Saline Spray 0.2 % SOLN Place 2 sprays into both nostrils as needed (nasal congestion).   simethicone (MYLICON) 80 MG chewable tablet Chew 80 mg by mouth every 6 (six) hours as needed for flatulence.   sodium phosphate (FLEET) 7-19 GM/118ML ENEM Place 1 enema rectally every other day as needed for severe constipation.   tiZANidine (ZANAFLEX) 2 MG tablet Take 1 tablet (2 mg total) by mouth 2 (two) times daily.   triamcinolone cream (KENALOG) 0.1 % Apply to thigh and lower legs topically every 8 hours as needed. Apply to back of neck topically as needed.   Zinc Oxide (TRIPLE PASTE) 12.8 % ointment Apply 1 Application topically. Every  shift.   No facility-administered encounter medications on file as of 10/07/2022.    Review of Systems  Constitutional:  Positive for unexpected weight change (weight gain). Negative for activity change.  Respiratory:  Negative for shortness of breath and wheezing.   Cardiovascular:  Positive for leg swelling. Negative for chest pain and palpitations.    Immunization History  Administered Date(s) Administered   Covid-19, Mrna,Vaccine(Spikevax)41yrs and older 05/11/2022   Influenza, High Dose Seasonal PF 11/17/2021   Influenza-Unspecified 11/14/2018, 11/20/2019, 11/17/2020, 11/17/2021   Moderna Covid-19 Vaccine Bivalent Booster 15yrs & up 06/30/2021, 12/11/2021   Moderna Sars-Covid-2 Vaccination 02/09/2019, 03/09/2019, 12/14/2019, 06/20/2020, 10/24/2020   PPD Test 05/16/2019   Tdap 11/29/2017   Zoster Recombinant(Shingrix) 09/02/2022   Pertinent  Health Maintenance Due  Topic Date Due   INFLUENZA VACCINE  09/02/2022   DEXA SCAN  Discontinued      08/19/2021   10:00 AM 08/19/2021    8:54 PM 08/20/2021    8:20 AM 03/29/2022  10:11 AM 08/31/2022    2:42 PM  Fall Risk  Falls in the past year?    0 0  Was there an injury with Fall?    0   Fall Risk Category Calculator    0   (RETIRED) Patient Fall Risk Level High fall risk High fall risk High fall risk    Patient at Risk for Falls Due to    No Fall Risks    Functional Status Survey:    Vitals:   10/07/22 1003  BP: 124/64  Pulse: 72  Resp: 18  Temp: 97.6 F (36.4 C)  SpO2: 95%  Weight: 179 lb 9.6 oz (81.5 kg)  Height: 5\' 7"  (1.702 m)   Body mass index is 28.13 kg/m. Physical Exam Constitutional:      General: She is not in acute distress.    Appearance: She is well-developed. She is not diaphoretic.  HENT:     Head: Normocephalic and atraumatic.     Mouth/Throat:     Pharynx: No oropharyngeal exudate.  Eyes:     Conjunctiva/sclera: Conjunctivae normal.     Pupils: Pupils are equal, round, and reactive to light.   Cardiovascular:     Rate and Rhythm: Normal rate and regular rhythm.     Heart sounds: Normal heart sounds.  Pulmonary:     Effort: Pulmonary effort is normal.     Breath sounds: Normal breath sounds.  Abdominal:     General: Bowel sounds are normal.     Palpations: Abdomen is soft.  Musculoskeletal:     Cervical back: Normal range of motion and neck supple.     Right lower leg: Edema (1+) present.     Left lower leg: Edema (1+) present.  Skin:    General: Skin is warm and dry.  Neurological:     Mental Status: She is alert.  Psychiatric:        Mood and Affect: Mood normal.     Labs reviewed: Recent Labs    04/21/22 1200 06/02/22 0000 09/29/22 0000  NA 133* 140 139  K 3.8 4.6 3.8  CL 100 102 100  CO2 30 31* 29*  GLUCOSE 108*  --   --   BUN 8 13 10   CREATININE 0.68 0.5 0.5  CALCIUM 8.0* 9.0 8.6*   Recent Labs    02/08/22 0000 09/29/22 0000  AST 18 20  ALT 14 14  ALKPHOS 65 58  ALBUMIN 3.7 3.6   Recent Labs    03/08/22 0000 04/21/22 1200 04/21/22 1731 04/26/22 0000 05/03/22 0000 05/10/22 0000 05/17/22 0000 09/30/22 0000  WBC 6.2 7.1  --  5.7  --   --   --  4.7  NEUTROABS 3,683.00  --   --  3,283.00  --   --   --  2,627.00  HGB 11.9* 8.5*   < > 8.3*   < > 10.0* 10.7* 13.2  HCT 35* 26.6*   < > 26*   < > 31* 33* 41  MCV  --  99.3  --   --   --   --   --   --   PLT 360 348  --  444*  --   --   --  280   < > = values in this interval not displayed.   Lab Results  Component Value Date   TSH 1.31 09/29/2022   No results found for: "HGBA1C" No results found for: "CHOL", "HDL", "LDLCALC", "LDLDIRECT", "TRIG", "CHOLHDL"  Significant  Diagnostic Results in last 30 days:  No results found.  Assessment/Plan 1. Bilateral leg edema Having more fluid retention since stopping aldactone Will start aldactone back at 12.5 mg by mouth daily and monitor edema and weight.   Janene Harvey. Biagio Borg [facility] [phone]

## 2022-10-11 ENCOUNTER — Ambulatory Visit: Payer: Medicare HMO

## 2022-10-11 ENCOUNTER — Ambulatory Visit: Payer: Medicare HMO | Admitting: Occupational Therapy

## 2022-10-11 DIAGNOSIS — R278 Other lack of coordination: Secondary | ICD-10-CM

## 2022-10-11 DIAGNOSIS — R262 Difficulty in walking, not elsewhere classified: Secondary | ICD-10-CM

## 2022-10-11 DIAGNOSIS — S72001A Fracture of unspecified part of neck of right femur, initial encounter for closed fracture: Secondary | ICD-10-CM

## 2022-10-11 DIAGNOSIS — M6281 Muscle weakness (generalized): Secondary | ICD-10-CM | POA: Diagnosis not present

## 2022-10-11 DIAGNOSIS — R269 Unspecified abnormalities of gait and mobility: Secondary | ICD-10-CM

## 2022-10-11 DIAGNOSIS — S14129S Central cord syndrome at unspecified level of cervical spinal cord, sequela: Secondary | ICD-10-CM

## 2022-10-11 NOTE — Therapy (Signed)
OUTPATIENT PHYSICAL THERAPY NEURO TREATMENT/RECERT   Patient Name: Sherry [name] MRN: [id number] DOB:November 01, 1934, 88 y.o., female Today's Date: 10/11/2022   PCP: Sherry Mealing, MD REFERRING PROVIDER: Deeann [name]   PT End of Session - 10/11/22 1152     Visit Number 69    Number of Visits 92    Date for PT Re-Evaluation 12/29/22    Progress Note Due on Visit 80    PT Start Time 1147    Activity Tolerance Patient tolerated treatment well;No increased pain;Patient limited by fatigue    Behavior During Therapy [facility] for tasks assessed/performed              Past Medical History:  Diagnosis Date   Acute blood loss anemia    Arthritis    Cancer (HCC)    skin   Central cord syndrome at C6 level of cervical spinal cord (HCC) 11/29/2017   Hypertension    Protein-calorie malnutrition, severe (HCC) 01/24/2018   S/P insertion of IVC (inferior vena caval) filter 01/24/2018   Tetraparesis (HCC)    Past Surgical History:  Procedure Laterality Date   ANTERIOR CERVICAL DECOMP/DISCECTOMY FUSION N/A 11/29/2017   Procedure: Cervical five-six, six-seven Anterior Cervical Decompression Fusion;  Surgeon: Sherry Pierini, MD;  Location: [facility] OR;  Service: Neurosurgery;  Laterality: N/A;  Cervical five-six, six-seven Anterior Cervical Decompression Fusion   CATARACT EXTRACTION     FEMUR IM NAIL Left 08/16/2021   Procedure: INTRAMEDULLARY (IM) NAIL FEMORAL;  Surgeon: Sherry Saint, MD;  Location: [facility] ORS;  Service: Orthopedics;  Laterality: Left;   IR IVC FILTER PLMT / S&I /IMG GUID/MOD SED  12/08/2017   Patient Active Problem List   Diagnosis Date Noted   Age-related osteoporosis without current pathological fracture 07/27/2022   Mild recurrent major depression (HCC) 07/27/2022   Hypertensive heart disease with other congestive heart failure (HCC) 02/14/2022   Chronic indwelling Foley catheter 02/14/2022   Closed hip fracture (HCC) 08/15/2021   DVT (deep venous thrombosis) (HCC)  08/15/2021   Quadriplegia (HCC) 08/15/2021   Fall 08/15/2021   Constipation due to slow transit 08/31/2018   Trauma 06/05/2018   Neuropathic pain 06/05/2018   Neurogenic bowel 06/05/2018   Vaginal yeast infection 01/30/2018   Healthcare-associated pneumonia 01/25/2018   Chronic allergic rhinitis 01/24/2018   Depression with anxiety 01/24/2018   UTI due to Klebsiella species 01/24/2018   Protein-calorie malnutrition, severe (HCC) 01/24/2018   S/P insertion of IVC (inferior vena caval) filter 01/24/2018   Tetraparesis (HCC) 01/20/2018   Neurogenic bladder 01/20/2018   Reactive depression    Benign essential HTN    Acute postoperative anemia due to expected blood loss    Central cord syndrome at C6 level of cervical spinal cord (HCC) 11/29/2017   Allergy to alpha-gal 11/25/2016   SCC (squamous cell carcinoma) 04/08/2014    ONSET DATE: 08/15/2021 (fall with B hip fx); Initial injury was in 2019- quadraplegia due to central cord syndrome of C6 secondary to neck fracture.   REFERRING DIAG: Bilateral Hip fx; Left Open- s/p Left IM nail femoral on 08/16/2021; Right closed  THERAPY DIAG:  Muscle weakness (generalized)  Other lack of coordination  Central cord syndrome, sequela (HCC)  Abnormality of gait and mobility  Difficulty in walking, not elsewhere classified  Closed fracture of both hips, initial encounter (HCC)  Rationale for Evaluation and Treatment Rehabilitation  SUBJECTIVE:   SUBJECTIVE STATEMENT:  Patient reports not feeling well today- Had some low blood pressure issues last week and some dizziness earlier  but just not up to par.     Pt accompanied by:  Sherry [name]-   PERTINENT HISTORY: Sherry [name] is an 88yoF who experienced a fall at home on 08/15/2021 with hip fracture, s/p Left hip ORIF. PMH: quadriplegia due to central cord syndrome of C6 secondary to neck fracture, HTN, depression with anxiety, neurogenic bladder, bilateral DVT on Xarelto (s/p of IVC  filter placement). Prior history of significant PT/OT with improvement of function. Pt has full use of shoulder/elbows, limited use of hands, adaptive self feeding sucessful.     PAIN:  None   OBJECTIVE:    TODAY'S TREATMENT:  10/11/22       THEREX:    -LAQ 2x12 reps each LE -Seated Hip flex 2 x 12 reps -Seated hip abd (AAROM) 2 x 12 reps -Seated hip add (ball squeeze) hold 5 sec 2 sets of 10 reps -Seated manual resistive Leg press 2 sets of 10 reps  Physical therapy treatment session today consisted of completing assessment of goals and administration of testing as demonstrated and documented in flow sheet, treatment, and goals section of this note. Addition treatments may be found below.     PATIENT EDUCATION: Education details: cues for isolated joint exercises  HOME EXERCISE PROGRAM: No changes at this time  Access Code: [facility] URL: [email or web address] Date: 11/23/2021 Prepared by: Sherry [name] Exercises - Seated Gluteal Sets - 2 x daily - 7 x weekly - 2 sets - 10 reps - 5 hold - Seated Quad Set - 2 x daily - 7 x weekly - 2 sets - 10 reps - 5 hold - Seated Long Arc Quad - 2 x daily - 7 x weekly - 2 sets - 10 reps - 5 hold - Seated March - 2 x daily - 7 x weekly - 2 sets - 10 reps - 5 hold - Seated Hip Abduction - 2 x daily - 7 x weekly - 2 sets - 10 reps - 5 hold - Seated Shoulder Shrugs - 2 x daily - 7 x weekly - 2 sets - 10 reps - 5 hold - Wheelchair Pressure Relief - 2 x daily - 7 x weekly - 2 sets - 10 reps - 5 hold  GOALS: Goals reviewed with patient? Yes  SHORT TERM GOALS: Target date: 02/22/2022  Pt will be independent with initial UE strengthening HEP in order to improve strength and balance in order to decrease fall risk and improve function at home and work. Baseline: 10/19/2021- patient with no formal UE HEP; 12/02/2021= Patient verbalized knowledge of HEP including use of theraband for UE strengthening.  Goal status: GOAL MET  LONG TERM  GOALS: Target date: 12/29/2022  Pt will be independent with final for UE/LE HEP in order to improve strength and balance in order to decrease fall risk and improve function at home and work. Baseline: Patient is currently BLE NWB and unable to participate in HEP. Has order for UE strengthening. 01/11/2022- Patient now able to participate in LE strengthening although NWB still- good understanding for some basic exercises. Will keep goal active to incorporate progressive LE strengthening exercises. 04/07/2022= Patient still participating in progressive LE seated HEP and has no questions at this time.  Goal status: MET  2.  Pt will improve FOTO to target score of 35  to display perceived improvements in ability to complete ADL's.  Baseline: 10/19/2021= 12; 12/02/2021= 17; 01/11/2022=17; 04/07/2022= 17; 07/05/2022=17; 10/06/2022- outcome measure not appropriate as patient in non-ambulatory and not consistently standing. Goal status:  Goal not appropriate  3.  Pt will increase strength of B UE  by at least 1/2 MMT grade in order to demonstrate improvement in strength and function  Baseline: patient has range of 2-/5 to 4/5 BUE Strength; 12/02/2021= 4/5 except with wrist ext. 01/11/2022= 4/5 B UE strength expept for wrist Goal status: Goal revised-no longer appropriate- working with OT on all UE strengthening.   4. Pt. Will increase strength of RLE by at least 1/2 MMT grade in order to demonstrate improvement in Standing/transfers.  Baseline: 2-/5 Right hip flex/knee ext/flex; 04/07/2022= 2- with right hip flex/knee ext (lacking 28 deg from zero) 4/8= Patient able to ext right knee lacking 18 deg from zero. 07/05/2022=Left knee approx 8 deg from zero and right knee = 23 deg from zero  Goal status: Progressing  5. Pt. Will demo ability to stand pivot transfer with max assist for improved functional mobility and less dependent need on mechanical device.   Baseline: Dependent on hoyer lift for all transfers; 04/07/2022-  Unable to test secondary to patient with recent UTI and right hand procedure - will attempt to reassess next visit. ; 04/12/22: Unable to successfully stand due to feet plates on loaned power w/c; 05/10/2022- Patient still having to use rental chair and has not received her original power w/c back to practice SPT. 07/04/2021= Patient just received new power w/c and attempted standing from 1st time today- Patient able to stand with max assist today from w/c and did not attempt to perform pivot transfer- difficulty with static standing and placing weight on right LE. 10/06/2022- Not assessed today- patient needs new AFO prior to attempting transfers  Goal status: ONGOING  6. Pt. Will demonstrate improved functional LE strength as seen by ability to stand > 2 min for improved transfer ability and pregait abilities.   Baseline: not assessed today due to recent dx: UTI; 04/07/2022- Unable to test secondary to patient with recent UTI and right hand procedure - will attempt to reassess next visit; 04/12/22: Unable to successfully stand due to feet plates on loaned power w/c 05/10/2022- Patient still having to use rental chair and has not received her original power w/c back to practice Standing. 07/05/2022- Patient able to stand with max assist at trunk today for 2 min 3 sec. Will keep goal active to ensure consistency. 10/06/2022= Unable to assess today- patient with some recent blood pressure issues and needs new AFO.   Goal status: ONGOING  ASSESSMENT:  CLINICAL IMPRESSION: Patient has been limited this certification with some missed visits, issues with AFO slipping, UTI's. She remains motivated with primary goal of standing/transferring. During this cert she did resume standing and was demonstrating improving posture. She will need a new AFO/shoe to improve standing ability and prevent contractures. Patient's condition has the potential to improve in response to therapy. Maximum improvement is yet to be obtained. The  anticipated improvement is attainable and reasonable in a generally predictable time.  Patient reports  . Pt will continue to benefit from skilled PT services to optimize independence and reduced [name] support.    OBJECTIVE IMPAIRMENTS Abnormal gait, decreased activity tolerance, decreased balance, decreased coordination, decreased endurance, decreased mobility, difficulty walking, decreased ROM, decreased strength, hypomobility, impaired flexibility, impaired UE functional use, postural dysfunction, and pain.   ACTIVITY LIMITATIONS carrying, lifting, bending, sitting, standing, squatting, sleeping, stairs, transfers, bed mobility, continence, bathing, toileting, dressing, self feeding, reach over head, hygiene/grooming, and caring for others  PARTICIPATION LIMITATIONS: meal prep, cleaning, laundry, medication management, personal  finances, interpersonal relationship, driving, shopping, community activity, and yard work  PERSONAL FACTORS Age, Time since onset of injury/illness/exacerbation, and 1-2 comorbidities: HTN, cervical Sx  are also affecting patient's functional outcome.   REHAB POTENTIAL: Good  CLINICAL DECISION MAKING: Evolving/moderate complexity  EVALUATION COMPLEXITY: Moderate  PLAN: PT FREQUENCY: 1-2x/week  PT DURATION: 12 weeks  PLANNED INTERVENTIONS: Therapeutic exercises, Therapeutic activity, Neuromuscular re-education, Balance training, Gait training, Patient/Family education, Self Care, Joint mobilization, DME instructions, Dry Needling, Electrical stimulation, Wheelchair mobility training, Spinal mobilization, Cryotherapy, Moist heat, and Manual therapy  PLAN FOR NEXT SESSION: strength, cortraining, motor control postural righting performance endurance   11:52 AM, 10/11/22 Louis Meckel, PT Physical Therapist - [facility]  Outpatient Physical Therapy- Main Campus [phone]

## 2022-10-11 NOTE — Therapy (Signed)
Occupational Therapy Neuro Treatment Note   Patient Name: Sherry [name] MRN: [id number] DOB:[birth year], 88 y.o., female Today's Date: 09/20/2022                                 PCP: Dr. Tillman Abide REFERRING PROVIDER: Dr. Earnestine Mealing   OT End of Session - 10/11/22 1156     Visit Number [id number]    Number of Visits 96    Date for OT Re-Evaluation 12/13/22    Authorization Time Period Progress report period starting 0812/2024    OT Start Time 1100    OT Stop Time 1145    OT Time Calculation (min) 45 min    Equipment Utilized During Treatment powered wheelchair    Activity Tolerance Patient tolerated treatment well    Behavior During Therapy WFL for tasks assessed/performed               Past Medical History:  Diagnosis Date   Acute blood loss anemia    Arthritis    Cancer (HCC)    skin   Central cord syndrome at C6 level of cervical spinal cord (HCC) 11/29/2017   Hypertension    Protein-calorie malnutrition, severe (HCC) 01/24/2018   S/P insertion of IVC (inferior vena caval) filter 01/24/2018   Tetraparesis (HCC)    Past Surgical History:  Procedure Laterality Date   ANTERIOR CERVICAL DECOMP/DISCECTOMY FUSION N/A 11/29/2017   Procedure: Cervical five-six, six-seven Anterior Cervical Decompression Fusion;  Surgeon: Jadene Pierini, MD;  Location: [facility] OR;  Service: Neurosurgery;  Laterality: N/A;  Cervical five-six, six-seven Anterior Cervical Decompression Fusion   CATARACT EXTRACTION     FEMUR IM NAIL Left 08/16/2021   Procedure: INTRAMEDULLARY (IM) NAIL FEMORAL;  Surgeon: Deeann Saint, MD;  Location: [facility] ORS;  Service: Orthopedics;  Laterality: Left;   IR IVC FILTER PLMT / S&I /IMG GUID/MOD SED  12/08/2017   Patient Active Problem List   Diagnosis Date Noted   Age-related osteoporosis without current pathological fracture 07/27/2022   Mild recurrent major depression (HCC) 07/27/2022   Hypertensive heart disease with other congestive heart failure (HCC)  02/14/2022   Chronic indwelling Foley catheter 02/14/2022   Closed hip fracture (HCC) 08/15/2021   DVT (deep venous thrombosis) (HCC) 08/15/2021   Quadriplegia (HCC) 08/15/2021   Fall 08/15/2021   Constipation due to slow transit 08/31/2018   Trauma 06/05/2018   Neuropathic pain 06/05/2018   Neurogenic bowel 06/05/2018   Vaginal yeast infection 01/30/2018   Healthcare-associated pneumonia 01/25/2018   Chronic allergic rhinitis 01/24/2018   Depression with anxiety 01/24/2018   UTI due to Klebsiella species 01/24/2018   Protein-calorie malnutrition, severe (HCC) 01/24/2018   S/P insertion of IVC (inferior vena caval) filter 01/24/2018   Tetraparesis (HCC) 01/20/2018   Neurogenic bladder 01/20/2018   Reactive depression    Benign essential HTN    Acute postoperative anemia due to expected blood loss    Central cord syndrome at C6 level of cervical spinal cord (HCC) 11/29/2017   Allergy to alpha-gal 11/25/2016   SCC (squamous cell carcinoma) 04/08/2014    REFERRING DIAG: Central Cord Syndrome at C6 of the Cervical Spinal Cord, Fall with Bilateral Closed Hip Fractures, with ORIF repair with Intramedullary Nailing of the Left Hip Fracture.  THERAPY DIAG:   Muscle weakness (generalized)  Other lack of coordination  Rationale for Evaluation and Treatment Rehabilitation  PERTINENT HISTORY: Patient s/p fall November 29, 2017 resulting in diagnosis  of central cord syndrome at C 6 level.  She has had therapy in multiple venues but with recent move to [facility], therapy staff recommended she seek outpatient therapy for her needs. Pt. Had a recent fall with Bilateral Closed Hip Fractures, with ORIF repair with Intramedullary Nailing of the Left Hip Fracture.   PRECAUTIONS: None  SUBJECTIVE:   Pt. Reports having had a good weekend, and watches a lot of football.  PAIN: Are you having pain No  OBJECTIVE:    Today's Treatment:    Manual Therapy:   Pt. tolerated soft tissue  massage to the volar, and dorsal surface of each digit on the bilateral hands, and thenar eminence secondary to stiffness following moist heat modality. Soft tissue mobilization was performed for Carpal, and metacarpal spread stretches. Manual therapy was performed independent of, and in preparation for therapeutic Ex.   Therapeutic Ex.    Pt. tolerated PROM followed by AROM bilateral wrist extension, PROM for bilateral digit MP, PIP, and DIP flexion, and extension, thumb radial, and palmar abduction. Emphasis was placed on left wrist extension with moist heat modality 2/2 tightness. Pt. Worked on BUE strengthening, and reciprocal motion using the UBE while seated for 8 min. with minimal resistance.  Pt. Worked on forward reverse motion every 2 min. Constant monitoring was provided.      PATIENT EDUCATION: RUE Distal ROM, and strength Person educated: Patient Education method: Explanation and Demonstration Education comprehension: verbalized understanding, returned demonstration, and needs further education   HOME EXERCISE PROGRAM Continue with ongoing HEPs  Measurements:  12/09/2021:  Shoulder flexion: R: 120(136), L: 138(150) Shoulder abduction: R: 108(120), L: 110(120) Elbow: R: 0-148, L: 0-146 Wrist flexion: R: 55(62), L: 78 Writ extension: R: 34(55), L: 3(4) RD: R: 12(20), L: 16(26) UD: R: 8(24), L: 6(18) Thumb radial abduction: R: 11(20), L: 8(20) Digit flexion to the [facility]: R:  2nd: 5cm(3.5cm), 3rd: 7.5 cm(5cm), 4th: 7cm(3cm), 5th: 6cm(5.5cm) L: 2nd: 8cm(8cm), 3rd: 8cm(8cm), 4th: 7.5cm(7cm), 5th: 7cm(7cm)  02/03/2022:   Shoulder flexion: R: 122(138), L: 148(154) Shoulder abduction: R: 109(125), L: 125(125 Elbow: R: 0-148, L: 0-146 Wrist flexion: R: 60(68), L: 79 Writ extension: R: 40(55), L: 3(10) RD: R: 14(24), L: 20(28) UD: R: 8(24), L: 6(18) Thumb radial abduction: R: 20(25), L: 8(20) Digit flexion to the [facility]: R:  2nd: 4.5cm(3cm), 3rd: 6.5cm(4.5cm), 4th: 6.5  cm(3cm), 5th: 4.5cm(5cm) L: 2nd: 8cm(8cm), 3rd: 8cm(7cm), 4th: 7.5cm(7cm), 5th: 6.5cm(6.5cm)   04/07/2022:   Shoulder flexion: R: 125(150), L: 160(160) Shoulder abduction: R: 109(125), L: 125(125 Elbow: R: 0-148, L: 0-146 Wrist flexion: R: 60(68), L: 79 Writ extension: R: 40(55), L: 5(18) RD: R: 14(24), L: 20(28) UD: R: 18(24), L: 8(18) Thumb radial abduction: R: 20(25), L: 8(20) Digit flexion to the [facility]: R:  2nd: 4.5 cm(4 cm), 3rd: 6.5cm(3cm), 4th: 7 cm (5cm), 5th: 5cm(5cm) L: 2nd: 8cm(8cm), 3rd: 8cm(7cm), 4th: 7.5cm(7cm), 5th: 7cm(7cm)   07/05/2022:   Shoulder flexion: R: 125(154), L: 160(160) Shoulder abduction: R: 95(130), L: 143(143) Elbow: R: 0-148, L: 0-146 Wrist flexion: R: 60(68), L: 79 Writ extension: R: 40(60), L: 0(12) RD: R: 10(24), L: 12(20) UD: R: 16(24), L: 8(16) Thumb radial abduction: R: 20(25), L: 8(20) Digit flexion to the [facility]: R:  2nd: 4 cm(4cm), 3rd: 6.5cm( 3.5cm), 4th: 7 cm (5cm), 5th: 6cm(5cm) L: 2nd: 8cm(8cm), 3rd: 8cm(7cm), 4th: 8cm(7cm), 5th: 7cm(7cm)  09/13/2022:   Shoulder flexion: R: 125(154), L: 160(160) Shoulder abduction: R: 100(130), L: 143(143) Elbow: R: 0-150, L: 0-150 Wrist flexion: R: 55(68),  L: 79 Writ extension: R: 45(60), L: 0(12) RD: R: 10(24), L: 12(20) UD: R: 16(24), L: 8(16) Thumb radial abduction: R: 20(25), L: 8(20) Digit flexion to the [facility]: R:  2nd: 4 cm(4cm), 3rd: 6cm( 5cm), 4th: 6 cm (4cm), 5th: 5cm(5cm) L: 2nd: 7cm(cm), 3rd: 7cm(6cm), 4th: 7cm(6cm), 5th: 6cm(6cm)      OT Long Term Goals - 07/06/21 1106                                                                                          Target Date: 12/13/2022      OT LONG TERM GOAL #1   Title Patient and caregiver will demonstrate understanding of home exercise program for ROM.    Baseline 12/09/2021: Pt. Reports the restorative ROM program has not resumed at the facility. 10/28/2021: Pt. Is attempting exercises/stretches at home. The facility restorative aides  have not resumed exercises. 09/14/2021:  Pt. Reports inconsistencies with restorative ROM at  her care home. Pt/caregiver education to be provided about ongoing ROM needs.    Time 12    Period Weeks    Status Deferred     OT LONG TERM GOAL #2   Title Pt.  will improve BUE strength to be able to sustain her UE's in elevation to efficiently, and effectively perform hair care, and self-grooming tasks.   Baseline 02/03/2022: Pt. has improved, and is now able to sustain her Bilateral UEs in elevation long enough to efficiently, and effectively perform haircare, and self-grooming tasks. 12/09/2021: right shoulder 4-/5, left shoulder 4/5. Pt. has improved to be able to reach up for hair care, and self-grooming tasks. 10/28/2021: Right shoulder 3+/5, Left shoulder 4-/5 09/14/2021:  BUE shoulder strength: 3+/5 for shoulder flexion, and abduction.   Time 12    Period Weeks    Status Achieved               OT LONG TERM GOAL #3   Title Patient will demonstrate improved composite finger flexion to be able to firmly hold  and use adaptive devices during ADLs, and IADLs.   Baseline 09/20/2022: Improving digit flexion to the [facility]. 09/13/22: improved digit flexion to the [facility]. 07/05/2022: Pt. presents with digit MP, PIP, and DIP extensor tightness limiting her ability to securely grip objects in her bilateral hands.  04/07/2022: Pt. Has improved with right 2nd, and 3rd digit flexion towards the [facility]. Pt. Continues to have difficulty securely holding and applying deodorant.02/03/2022: Pt. Has improved with digit flexion, however continues to have difficulty securely holding and using deoderant. 12/09/2021: Bilateral hand/digit MP, PIP, and DIP extension tightness limits her ability achieve digit flexion to hold, and apply deodorant. 10/28/2021:  Pt. continues to have difficulty holding the deodorant. 01/12/2021: Pt. Presents with limited digit extension. Pt. Is able to initiate holding deodorant, however is unable to hold it while  using it.   Time 12    Period Weeks    Status Revised   Target Date                  OT LONG TERM GOAL #4   Title Pt. Will improve bilateral wrist extension in  preparation for anticipating, and initiating reaching for objects at the table.    Baseline 09/20/2022: limited PROM in the Left wrist extension 2/2 tightness/stiffness. 09/13/22: R: 55(68), L: 79 07/19/2022: Bilateral wrist extension in limited. 07/05/2022: Right: 40(60) Left: 0(12) 04/07/2022: Rght: 40(55) Left: 5(18) 02/03/2022: Right: 34(55) Left: 3(4) 12/09/2021: Right  34(55)  Left  3(4) 10/28/2021:  Right 22(38), Left 0(15) 09/14/2021:  Right: 17(35), left 2(15)   Time 12   Period weeks   Status Ongoing   Target Date      OT LONG TERM GOAL #5   Title Pt. will write, and sign her name with 100% legibility, and modified independence.    Baseline 09/13/22: 100% legibility to sign name, intermittent tremors 07/19/2022:  Pt. continues to be able to consistently fill out daily meal menus, and puzzles. 6/03/204: Pt. Is consistently filling out  daily meal menus, and puzzles. 04/07/2022: Pt. Reports that she continues to present with shaky appearing writing. Pt.reports that she is writing more when filling out her menu, and complete puzzles 02/03/2022: Pt. Continues to report legibility appears "shaky" when attempting signing her name. Pt.reports that she is writing more when filling out her menu, and complete puzzles.12/09/2021: Pt. Reports legibility appears "shaky" when attempting signing her name. Pt.reports that she is writing more when filling out her menu. 10/28/2021: pt. is able to sign her name, however legibility continues to be inconsistent. Pt. Reports "shaky appearance". 09/14/2021: Pt. continues to fill out her daily menu and complete puzzles. Pt. Continues to present with limited writing legibility.   Time 12    Period Weeks    Status  Achieved         OT LONG TERM GOAL  #6   TITLE Pt. will turn pages in a book with modified  independence.    Baseline 02/03/2022: Pt. Is able to effectively turn pages of a book. 10/28/2021: Pt. Is able to consistently turn book pages. 09/14/2021: Pt. has difficulty turning pages.    Time 12    Period Weeks    Status Achieved       OT LONG TERM GOAL  #7  TITLE Pt. Will increase  bilateral lateral pinch strength by 2 lbs to be able to securely grasp items during ADLs, and IADL tasks.   Baseline 09/20/2022: lateral pinch continues to be limited 09/13/22: R 6.5# L 4# 07/19/2022: TBD 07/05/2022: TBD 07/2022: NT-Pinch meter out for calibration. 02/03/2022: Right: 6#, Left: 4# 12/09/2021:  Right: 6#, Left: 4#   Time 12  Period Weeks  Status Ongoing           OT LONG TERM GOAL  #8  TITLE Pt. Will button a sweater with large 1" buttons, and zip her jacket with modified independence.   Baseline 06/07/2022: Pt. is able to use a button hook to button a sweater. Pt. Reports that she does not wear button down clothing often.  04/07/2022: Pt. Has difficulty manipulating buttons, however was introduced to a buttonhook. Pt. Was able to hold, and use a buttonhook for buttons on a shirt. 02/03/2022: Pt. is unable to manipulate buttons on her sweater, or zippers on her jacket.  Time 12  Period Weeks  Status Achieved   OT LONG TERM GOAL  #9  TITLE Pt. will complete plant care with modified independence.  Baseline 09/20/2022: Pt. Continues to have difficulty reaching for thorough plant care. 09/13/22: continues to report intermittent difficulty 07/19/2022: Pt. Continues to be able to water, and care for some  of her  plants. Pt. Has more difficulty with plants that are harder to reach. 07/05/2022: pt. Is able to water, and care for some  of her plants. Pt. Has more difficulty with plants that are harder to reach. 04/07/2022: Pt. is now able to hold a cup and water her plants.02/03/2022: Pt. has difficulty caring for her plants.   Time 12  Period Weeks  Status Ongoing   OT LONG TERM GOAL  #10  TITLE Pt. will  demonstrate adaptive techniques to assist with the efficiency of self-dressing, or morning care tasks.  Baseline 09/20/2022: Pt. continues to require assist form personal, and staff care aides. 09/13/22: MOD A dressing, reports she is often rushed 07/19/2022: Pt. continues to require assist with self-dressing/morning care tasks.   07/05/2022: Continue with goal.  04/07/2022: Pt. Continues to require assist with the efficiency of self-dressing, and morning care tasks.02/03/2022: Pt. requires assist from caregivers 2/2 time limitations during morning care.   Time 12  Period Weeks  Status Ongoing  OT LONG TERM GOAL  #11 Title Pt.  will improve BUE strength to improve ADL, and IADL functioning.  Baseline 09/20/2022: Pt. Presents with limited BUE strength 09/13/22: shoulder flexion L 4/5, R 3/5; elbow flexion B 5/5, elbow extension 4-/5; shoulder abduction L 4+/5, R 3+/5. 07/19/2022:  BUE strength continues to be limited. 07/05/2022: shoulder flexion: right 4-/5, abduction: 3+/5, elbow flexion: right: 5/5, left 5/5, extension: right: 4-/5, left 4-/5, wrist extension: right: 3-/5, left: 2-/5  Time 12   Period Weeks   Status Ongoing     Plan - 07/22/21 1737     Clinical Impression Pt. continues to tolerate manual therapy, there. Ex., and ROM well today. Pt. presents with tightness at the bilateral thumb webspace, and bilateral wrist flexor tightness, as well as digit MP, PIP, and DIP extensor tightness which continues to limit her ability to complete ADL tasks efficiently. Pt. continues to benefit from working on impoving ROM in order to work towards increasing bilateral hand grasp on objects, and increase engagement of bilateral hands during ADLs, and IADL tasks.       OT Occupational Profile and History Detailed Assessment- Review of Records and additional review of physical, cognitive, psychosocial history related to current functional performance    Occupational performance deficits (Please refer to evaluation  for details): ADL's;IADL's;Leisure;Social Participation    Body Structure / Function / Physical Skills ADL;Continence;Dexterity;Flexibility;Strength;ROM;Balance;Coordination;FMC;IADL;Endurance;UE functional use;Decreased knowledge of use of DME;[facility]    Psychosocial Skills Coping Strategies    Rehab Potential Fair    Clinical Decision Making Several treatment options, min-mod task modification necessary    Comorbidities Affecting Occupational Performance: Presence of comorbidities impacting occupational performance    Comorbidities impacting occupational performance description: contractures of bilateral hands, dependent transfers    Modification or Assistance to Complete Evaluation  No modification of tasks or assist necessary to complete eval    OT Frequency 2x / week    OT Duration 12 weeks    OT Treatment/Interventions Self-care/ADL training;Cryotherapy;Therapeutic exercise;DME and/or AE instruction;Balance training;Neuromuscular education;Manual Therapy;Splinting;Moist Heat;Passive range of motion;Therapeutic activities;Patient/family education    Plan continue to progress ROM of digits, wrists and shoulders as it pertains to completion of ADL tasks that pt values.    Consulted and Agree with Plan of Care Patient         Olegario Messier, MS, OTR/L  10/11/22, 3:12 PM

## 2022-10-13 ENCOUNTER — Ambulatory Visit: Payer: Medicare HMO

## 2022-10-13 ENCOUNTER — Ambulatory Visit: Payer: Medicare HMO | Admitting: Occupational Therapy

## 2022-10-13 DIAGNOSIS — M6281 Muscle weakness (generalized): Secondary | ICD-10-CM | POA: Diagnosis not present

## 2022-10-13 DIAGNOSIS — R269 Unspecified abnormalities of gait and mobility: Secondary | ICD-10-CM

## 2022-10-13 DIAGNOSIS — R262 Difficulty in walking, not elsewhere classified: Secondary | ICD-10-CM

## 2022-10-13 DIAGNOSIS — S72001A Fracture of unspecified part of neck of right femur, initial encounter for closed fracture: Secondary | ICD-10-CM

## 2022-10-13 DIAGNOSIS — S14129S Central cord syndrome at unspecified level of cervical spinal cord, sequela: Secondary | ICD-10-CM

## 2022-10-13 DIAGNOSIS — R278 Other lack of coordination: Secondary | ICD-10-CM

## 2022-10-13 NOTE — Therapy (Signed)
Occupational Therapy Neuro Treatment Note   Patient Name: Sherry [name] MRN: [id number] DOB:Mar 12, 1934, 88 y.o., female Today's Date: 09/20/2022                                 PCP: Dr. Tillman Abide REFERRING PROVIDER: Dr. Earnestine Mealing   OT End of Session - 10/13/22 1539     Visit Number [id number]    Number of Visits 96    Date for OT Re-Evaluation 12/13/22    OT Start Time 1100    OT Stop Time 1145    OT Time Calculation (min) 45 min    Activity Tolerance Patient tolerated treatment well    Behavior During Therapy [place] for tasks assessed/performed               Past Medical History:  Diagnosis Date   Acute blood loss anemia    Arthritis    Cancer (HCC)    skin   Central cord syndrome at C6 level of cervical spinal cord (HCC) 11/29/2017   Hypertension    Protein-calorie malnutrition, severe (HCC) 01/24/2018   S/P insertion of IVC (inferior vena caval) filter 01/24/2018   Tetraparesis (HCC)    Past Surgical History:  Procedure Laterality Date   ANTERIOR CERVICAL DECOMP/DISCECTOMY FUSION N/A 11/29/2017   Procedure: Cervical five-six, six-seven Anterior Cervical Decompression Fusion;  Surgeon: Jadene Pierini, MD;  Location: [facility] OR;  Service: Neurosurgery;  Laterality: N/A;  Cervical five-six, six-seven Anterior Cervical Decompression Fusion   CATARACT EXTRACTION     FEMUR IM NAIL Left 08/16/2021   Procedure: INTRAMEDULLARY (IM) NAIL FEMORAL;  Surgeon: Deeann Saint, MD;  Location: [facility] ORS;  Service: Orthopedics;  Laterality: Left;   IR IVC FILTER PLMT / S&I /IMG GUID/MOD SED  12/08/2017   Patient Active Problem List   Diagnosis Date Noted   Age-related osteoporosis without current pathological fracture 07/27/2022   Mild recurrent major depression (HCC) 07/27/2022   Hypertensive heart disease with other congestive heart failure (HCC) 02/14/2022   Chronic indwelling Foley catheter 02/14/2022   Closed hip fracture (HCC) 08/15/2021   DVT (deep venous thrombosis)  (HCC) 08/15/2021   Quadriplegia (HCC) 08/15/2021   Fall 08/15/2021   Constipation due to slow transit 08/31/2018   Trauma 06/05/2018   Neuropathic pain 06/05/2018   Neurogenic bowel 06/05/2018   Vaginal yeast infection 01/30/2018   Healthcare-associated pneumonia 01/25/2018   Chronic allergic rhinitis 01/24/2018   Depression with anxiety 01/24/2018   UTI due to Klebsiella species 01/24/2018   Protein-calorie malnutrition, severe (HCC) 01/24/2018   S/P insertion of IVC (inferior vena caval) filter 01/24/2018   Tetraparesis (HCC) 01/20/2018   Neurogenic bladder 01/20/2018   Reactive depression    Benign essential HTN    Acute postoperative anemia due to expected blood loss    Central cord syndrome at C6 level of cervical spinal cord (HCC) 11/29/2017   Allergy to alpha-gal 11/25/2016   SCC (squamous cell carcinoma) 04/08/2014    REFERRING DIAG: Central Cord Syndrome at C6 of the Cervical Spinal Cord, Fall with Bilateral Closed Hip Fractures, with ORIF repair with Intramedullary Nailing of the Left Hip Fracture.  THERAPY DIAG:   Muscle weakness (generalized)  Other lack of coordination  Rationale for Evaluation and Treatment Rehabilitation  PERTINENT HISTORY: Patient s/p fall November 29, 2017 resulting in diagnosis of central cord syndrome at C 6 level.  She has had therapy in multiple venues but with recent move  to [facility], therapy staff recommended she seek outpatient therapy for her needs. Pt. Had a recent fall with Bilateral Closed Hip Fractures, with ORIF repair with Intramedullary Nailing of the Left Hip Fracture.   PRECAUTIONS: None  SUBJECTIVE:   Pt. reports doing well, with no pain today.  PAIN: Are you having pain No  OBJECTIVE:    Today's Treatment:    Manual Therapy:   Pt. tolerated soft tissue massage to the volar, and dorsal surface of each digit on the bilateral hands, and thenar eminence secondary to stiffness following moist heat modality. Soft  tissue mobilization was performed for Carpal, and metacarpal spread stretches. Manual therapy was performed independent of, and in preparation for therapeutic Ex.   Therapeutic Ex.    Pt. tolerated PROM followed by AROM bilateral wrist extension, PROM for bilateral digit MP, PIP, and DIP flexion, and extension, thumb radial, and palmar abduction. Emphasis was placed on left wrist extension with moist heat modality 2/2 tightness. Pt. performed 2.5# dowel ex. For UE strengthening secondary to weakness. Bilateral shoulder flexion, chest press, and circular patterns were performed.      PATIENT EDUCATION: RUE Distal ROM, and strength Person educated: Patient Education method: Explanation and Demonstration Education comprehension: verbalized understanding, returned demonstration, and needs further education   HOME EXERCISE PROGRAM Continue with ongoing HEPs  Measurements:  12/09/2021:  Shoulder flexion: R: 120(136), L: 138(150) Shoulder abduction: R: 108(120), L: 110(120) Elbow: R: 0-148, L: 0-146 Wrist flexion: R: 55(62), L: 78 Writ extension: R: 34(55), L: 3(4) RD: R: 12(20), L: 16(26) UD: R: 8(24), L: 6(18) Thumb radial abduction: R: 11(20), L: 8(20) Digit flexion to the [facility]: R:  2nd: 5cm(3.5cm), 3rd: 7.5 cm(5cm), 4th: 7cm(3cm), 5th: 6cm(5.5cm) L: 2nd: 8cm(8cm), 3rd: 8cm(8cm), 4th: 7.5cm(7cm), 5th: 7cm(7cm)  02/03/2022:   Shoulder flexion: R: 122(138), L: 148(154) Shoulder abduction: R: 109(125), L: 125(125 Elbow: R: 0-148, L: 0-146 Wrist flexion: R: 60(68), L: 79 Writ extension: R: 40(55), L: 3(10) RD: R: 14(24), L: 20(28) UD: R: 8(24), L: 6(18) Thumb radial abduction: R: 20(25), L: 8(20) Digit flexion to the [facility]: R:  2nd: 4.5cm(3cm), 3rd: 6.5cm(4.5cm), 4th: 6.5 cm(3cm), 5th: 4.5cm(5cm) L: 2nd: 8cm(8cm), 3rd: 8cm(7cm), 4th: 7.5cm(7cm), 5th: 6.5cm(6.5cm)   04/07/2022:   Shoulder flexion: R: 125(150), L: 160(160) Shoulder abduction: R: 109(125), L: 125(125 Elbow: R:  0-148, L: 0-146 Wrist flexion: R: 60(68), L: 79 Writ extension: R: 40(55), L: 5(18) RD: R: 14(24), L: 20(28) UD: R: 18(24), L: 8(18) Thumb radial abduction: R: 20(25), L: 8(20) Digit flexion to the [facility]: R:  2nd: 4.5 cm(4 cm), 3rd: 6.5cm(3cm), 4th: 7 cm (5cm), 5th: 5cm(5cm) L: 2nd: 8cm(8cm), 3rd: 8cm(7cm), 4th: 7.5cm(7cm), 5th: 7cm(7cm)   07/05/2022:   Shoulder flexion: R: 125(154), L: 160(160) Shoulder abduction: R: 95(130), L: 143(143) Elbow: R: 0-148, L: 0-146 Wrist flexion: R: 60(68), L: 79 Writ extension: R: 40(60), L: 0(12) RD: R: 10(24), L: 12(20) UD: R: 16(24), L: 8(16) Thumb radial abduction: R: 20(25), L: 8(20) Digit flexion to the [facility]: R:  2nd: 4 cm(4cm), 3rd: 6.5cm( 3.5cm), 4th: 7 cm (5cm), 5th: 6cm(5cm) L: 2nd: 8cm(8cm), 3rd: 8cm(7cm), 4th: 8cm(7cm), 5th: 7cm(7cm)  09/13/2022:   Shoulder flexion: R: 125(154), L: 160(160) Shoulder abduction: R: 100(130), L: 143(143) Elbow: R: 0-150, L: 0-150 Wrist flexion: R: 55(68), L: 79 Writ extension: R: 45(60), L: 0(12) RD: R: 10(24), L: 12(20) UD: R: 16(24), L: 8(16) Thumb radial abduction: R: 20(25), L: 8(20) Digit flexion to the [facility]: R:  2nd: 4 cm(4cm), 3rd: 6cm(  5cm), 4th: 6 cm (4cm), 5th: 5cm(5cm) L: 2nd: 7cm(cm), 3rd: 7cm(6cm), 4th: 7cm(6cm), 5th: 6cm(6cm)      OT Long Term Goals - 07/06/21 1106                                                                                          Target Date: 12/13/2022      OT LONG TERM GOAL #1   Title Patient and caregiver will demonstrate understanding of home exercise program for ROM.    Baseline 12/09/2021: Pt. Reports the restorative ROM program has not resumed at the facility. 10/28/2021: Pt. Is attempting exercises/stretches at home. The facility restorative aides have not resumed exercises. 09/14/2021:  Pt. Reports inconsistencies with restorative ROM at  her care home. Pt/caregiver education to be provided about ongoing ROM needs.    Time 12    Period Weeks    Status  Deferred     OT LONG TERM GOAL #2   Title Pt.  will improve BUE strength to be able to sustain her UE's in elevation to efficiently, and effectively perform hair care, and self-grooming tasks.   Baseline 02/03/2022: Pt. has improved, and is now able to sustain her Bilateral UEs in elevation long enough to efficiently, and effectively perform haircare, and self-grooming tasks. 12/09/2021: right shoulder 4-/5, left shoulder 4/5. Pt. has improved to be able to reach up for hair care, and self-grooming tasks. 10/28/2021: Right shoulder 3+/5, Left shoulder 4-/5 09/14/2021:  BUE shoulder strength: 3+/5 for shoulder flexion, and abduction.   Time 12    Period Weeks    Status Achieved               OT LONG TERM GOAL #3   Title Patient will demonstrate improved composite finger flexion to be able to firmly hold  and use adaptive devices during ADLs, and IADLs.   Baseline 09/20/2022: Improving digit flexion to the [facility]. 09/13/22: improved digit flexion to the [facility]. 07/05/2022: Pt. presents with digit MP, PIP, and DIP extensor tightness limiting her ability to securely grip objects in her bilateral hands.  04/07/2022: Pt. Has improved with right 2nd, and 3rd digit flexion towards the [facility]. Pt. Continues to have difficulty securely holding and applying deodorant.02/03/2022: Pt. Has improved with digit flexion, however continues to have difficulty securely holding and using deoderant. 12/09/2021: Bilateral hand/digit MP, PIP, and DIP extension tightness limits her ability achieve digit flexion to hold, and apply deodorant. 10/28/2021:  Pt. continues to have difficulty holding the deodorant. 01/12/2021: Pt. Presents with limited digit extension. Pt. Is able to initiate holding deodorant, however is unable to hold it while using it.   Time 12    Period Weeks    Status Revised   Target Date                  OT LONG TERM GOAL #4   Title Pt. Will improve bilateral wrist extension in preparation for anticipating, and  initiating reaching for objects at the table.    Baseline 09/20/2022: limited PROM in the Left wrist extension 2/2 tightness/stiffness. 09/13/22: R: 55(68), L: 79 07/19/2022: Bilateral wrist extension in limited. 07/05/2022:  Right: 40(60) Left: 0(12) 04/07/2022: Rght: 40(55) Left: 5(18) 02/03/2022: Right: 34(55) Left: 3(4) 12/09/2021: Right  34(55)  Left  3(4) 10/28/2021:  Right 22(38), Left 0(15) 09/14/2021:  Right: 17(35), left 2(15)   Time 12   Period weeks   Status Ongoing   Target Date      OT LONG TERM GOAL #5   Title Pt. will write, and sign her name with 100% legibility, and modified independence.    Baseline 09/13/22: 100% legibility to sign name, intermittent tremors 07/19/2022:  Pt. continues to be able to consistently fill out daily meal menus, and puzzles. 6/03/204: Pt. Is consistently filling out  daily meal menus, and puzzles. 04/07/2022: Pt. Reports that she continues to present with shaky appearing writing. Pt.reports that she is writing more when filling out her menu, and complete puzzles 02/03/2022: Pt. Continues to report legibility appears "shaky" when attempting signing her name. Pt.reports that she is writing more when filling out her menu, and complete puzzles.12/09/2021: Pt. Reports legibility appears "shaky" when attempting signing her name. Pt.reports that she is writing more when filling out her menu. 10/28/2021: pt. is able to sign her name, however legibility continues to be inconsistent. Pt. Reports "shaky appearance". 09/14/2021: Pt. continues to fill out her daily menu and complete puzzles. Pt. Continues to present with limited writing legibility.   Time 12    Period Weeks    Status  Achieved         OT LONG TERM GOAL  #6   TITLE Pt. will turn pages in a book with modified independence.    Baseline 02/03/2022: Pt. Is able to effectively turn pages of a book. 10/28/2021: Pt. Is able to consistently turn book pages. 09/14/2021: Pt. has difficulty turning pages.    Time 12    Period  Weeks    Status Achieved       OT LONG TERM GOAL  #7  TITLE Pt. Will increase  bilateral lateral pinch strength by 2 lbs to be able to securely grasp items during ADLs, and IADL tasks.   Baseline 09/20/2022: lateral pinch continues to be limited 09/13/22: R 6.5# L 4# 07/19/2022: TBD 07/05/2022: TBD 07/2022: NT-Pinch meter out for calibration. 02/03/2022: Right: 6#, Left: 4# 12/09/2021:  Right: 6#, Left: 4#   Time 12  Period Weeks  Status Ongoing           OT LONG TERM GOAL  #8  TITLE Pt. Will button a sweater with large 1" buttons, and zip her jacket with modified independence.   Baseline 06/07/2022: Pt. is able to use a button hook to button a sweater. Pt. Reports that she does not wear button down clothing often.  04/07/2022: Pt. Has difficulty manipulating buttons, however was introduced to a buttonhook. Pt. Was able to hold, and use a buttonhook for buttons on a shirt. 02/03/2022: Pt. is unable to manipulate buttons on her sweater, or zippers on her jacket.  Time 12  Period Weeks  Status Achieved   OT LONG TERM GOAL  #9  TITLE Pt. will complete plant care with modified independence.  Baseline 09/20/2022: Pt. Continues to have difficulty reaching for thorough plant care. 09/13/22: continues to report intermittent difficulty 07/19/2022: Pt. Continues to be able to water, and care for some  of her plants. Pt. Has more difficulty with plants that are harder to reach. 07/05/2022: pt. Is able to water, and care for some  of her plants. Pt. Has more difficulty with plants that are harder to reach.  04/07/2022: Pt. is now able to hold a cup and water her plants.02/03/2022: Pt. has difficulty caring for her plants.   Time 12  Period Weeks  Status Ongoing   OT LONG TERM GOAL  #10  TITLE Pt. will demonstrate adaptive techniques to assist with the efficiency of self-dressing, or morning care tasks.  Baseline 09/20/2022: Pt. continues to require assist form personal, and staff care aides. 09/13/22: MOD A  dressing, reports she is often rushed 07/19/2022: Pt. continues to require assist with self-dressing/morning care tasks.   07/05/2022: Continue with goal.  04/07/2022: Pt. Continues to require assist with the efficiency of self-dressing, and morning care tasks.02/03/2022: Pt. requires assist from caregivers 2/2 time limitations during morning care.   Time 12  Period Weeks  Status Ongoing  OT LONG TERM GOAL  #11 Title Pt.  will improve BUE strength to improve ADL, and IADL functioning.  Baseline 09/20/2022: Pt. Presents with limited BUE strength 09/13/22: shoulder flexion L 4/5, R 3/5; elbow flexion B 5/5, elbow extension 4-/5; shoulder abduction L 4+/5, R 3+/5. 07/19/2022:  BUE strength continues to be limited. 07/05/2022: shoulder flexion: right 4-/5, abduction: 3+/5, elbow flexion: right: 5/5, left 5/5, extension: right: 4-/5, left 4-/5, wrist extension: right: 3-/5, left: 2-/5  Time 12   Period Weeks   Status Ongoing     Plan - 07/22/21 1737     Clinical Impression Pt. continues to tolerate manual therapy, there. Ex., and ROM well today. Pt. was able to tolerate UE strengthening with 2.5# dowel. Pt. presents with tightness at the bilateral thumb webspace, and bilateral wrist flexor tightness, as well as digit MP, PIP, and DIP extensor tightness which continues to limit her ability to complete ADL tasks efficiently. Pt. continues to benefit from working on impoving ROM in order to work towards increasing bilateral hand grasp on objects, and increase engagement of bilateral hands during ADLs, and IADL tasks.       OT Occupational Profile and History Detailed Assessment- Review of Records and additional review of physical, cognitive, psychosocial history related to current functional performance    Occupational performance deficits (Please refer to evaluation for details): ADL's;IADL's;Leisure;Social Participation    Body Structure / Function / Physical Skills  ADL;Continence;Dexterity;Flexibility;Strength;ROM;Balance;Coordination;FMC;IADL;Endurance;UE functional use;Decreased knowledge of use of DME;[facility]    Psychosocial Skills Coping Strategies    Rehab Potential Fair    Clinical Decision Making Several treatment options, min-mod task modification necessary    Comorbidities Affecting Occupational Performance: Presence of comorbidities impacting occupational performance    Comorbidities impacting occupational performance description: contractures of bilateral hands, dependent transfers    Modification or Assistance to Complete Evaluation  No modification of tasks or assist necessary to complete eval    OT Frequency 2x / week    OT Duration 12 weeks    OT Treatment/Interventions Self-care/ADL training;Cryotherapy;Therapeutic exercise;DME and/or AE instruction;Balance training;Neuromuscular education;Manual Therapy;Splinting;Moist Heat;Passive range of motion;Therapeutic activities;Patient/family education    Plan continue to progress ROM of digits, wrists and shoulders as it pertains to completion of ADL tasks that pt values.    Consulted and Agree with Plan of Care Patient         Olegario Messier, MS, OTR/L  10/13/22, 3:12 PM

## 2022-10-13 NOTE — Therapy (Signed)
OUTPATIENT PHYSICAL THERAPY NEURO TREATMENT/Physical Therapy Progress Note   Dates of reporting period  08/18/2022   to   10/13/2022    Patient Name: Sherry [name] MRN: [id number] DOB:03/25/1934, 88 y.o., female Today's Date: 10/13/2022   PCP: Earnestine Mealing, MD REFERRING PROVIDER: Deeann Saint   PT End of Session - 10/13/22 1259     Visit Number 70    Number of Visits 92    Date for PT Re-Evaluation 12/29/22    Progress Note Due on Visit 80    PT Start Time 1145    PT Stop Time 1227    PT Time Calculation (min) 42 min    Equipment Utilized During Treatment Gait belt    Activity Tolerance Patient tolerated treatment well;No increased pain;Patient limited by fatigue    Behavior During Therapy [facility] for tasks assessed/performed              Past Medical History:  Diagnosis Date   Acute blood loss anemia    Arthritis    Cancer (HCC)    skin   Central cord syndrome at C6 level of cervical spinal cord (HCC) 11/29/2017   Hypertension    Protein-calorie malnutrition, severe (HCC) 01/24/2018   S/P insertion of IVC (inferior vena caval) filter 01/24/2018   Tetraparesis (HCC)    Past Surgical History:  Procedure Laterality Date   ANTERIOR CERVICAL DECOMP/DISCECTOMY FUSION N/A 11/29/2017   Procedure: Cervical five-six, six-seven Anterior Cervical Decompression Fusion;  Surgeon: Jadene Pierini, MD;  Location: [facility] OR;  Service: Neurosurgery;  Laterality: N/A;  Cervical five-six, six-seven Anterior Cervical Decompression Fusion   CATARACT EXTRACTION     FEMUR IM NAIL Left 08/16/2021   Procedure: INTRAMEDULLARY (IM) NAIL FEMORAL;  Surgeon: Deeann Saint, MD;  Location: [facility] ORS;  Service: Orthopedics;  Laterality: Left;   IR IVC FILTER PLMT / S&I /IMG GUID/MOD SED  12/08/2017   Patient Active Problem List   Diagnosis Date Noted   Age-related osteoporosis without current pathological fracture 07/27/2022   Mild recurrent major depression (HCC) 07/27/2022   Hypertensive  heart disease with other congestive heart failure (HCC) 02/14/2022   Chronic indwelling Foley catheter 02/14/2022   Closed hip fracture (HCC) 08/15/2021   DVT (deep venous thrombosis) (HCC) 08/15/2021   Quadriplegia (HCC) 08/15/2021   Fall 08/15/2021   Constipation due to slow transit 08/31/2018   Trauma 06/05/2018   Neuropathic pain 06/05/2018   Neurogenic bowel 06/05/2018   Vaginal yeast infection 01/30/2018   Healthcare-associated pneumonia 01/25/2018   Chronic allergic rhinitis 01/24/2018   Depression with anxiety 01/24/2018   UTI due to Klebsiella species 01/24/2018   Protein-calorie malnutrition, severe (HCC) 01/24/2018   S/P insertion of IVC (inferior vena caval) filter 01/24/2018   Tetraparesis (HCC) 01/20/2018   Neurogenic bladder 01/20/2018   Reactive depression    Benign essential HTN    Acute postoperative anemia due to expected blood loss    Central cord syndrome at C6 level of cervical spinal cord (HCC) 11/29/2017   Allergy to alpha-gal 11/25/2016   SCC (squamous cell carcinoma) 04/08/2014    ONSET DATE: 08/15/2021 (fall with B hip fx); Initial injury was in 2019- quadraplegia due to central cord syndrome of C6 secondary to neck fracture.   REFERRING DIAG: Bilateral Hip fx; Left Open- s/p Left IM nail femoral on 08/16/2021; Right closed  THERAPY DIAG:  Muscle weakness (generalized)  Other lack of coordination  Central cord syndrome, sequela (HCC)  Abnormality of gait and mobility  Difficulty in walking, not  elsewhere classified  Closed fracture of both hips, initial encounter [facility])  Rationale for Evaluation and Treatment Rehabilitation  SUBJECTIVE:   SUBJECTIVE STATEMENT:  Patient reports no new issues since last visit    Pt accompanied by:  Paid caregiver-   PERTINENT HISTORY: Sherry [name] is an 88yoF who experienced a fall at home on 08/15/2021 with hip fracture, s/p Left hip ORIF. PMH: quadriplegia due to central cord syndrome of C6 secondary  to neck fracture, HTN, depression with anxiety, neurogenic bladder, bilateral DVT on Xarelto (s/p of IVC filter placement). Prior history of significant PT/OT with improvement of function. Pt has full use of shoulder/elbows, limited use of hands, adaptive self feeding sucessful.     PAIN:  None   OBJECTIVE:    TODAY'S TREATMENT:  10/13/22    *Judy Pimple, [facility] with [employer] present today to observe patient and her current AFO Discussion with patient and Kathlene November after watching her stand 2 trials. Kathlene November recommending new non-skid sole that extends past toe box for more suface area and new straps to keep foot maintained in dorsiflexion better.    THERAPEUTIC ACTIVITIES:   Assessed current AFO with Judy Pimple, [facility]- (see above)   Sit to stand in // bars- Max A +1 then another person (patient's paid caregiver)  to assist to keep gluteals engaged- pressing on bottom at times if patient lost her erect standing. She performed 3 trials of standing today from 30 sec up to 2:49 min. PT had his foot to block patient's foot from slipping forward and after she adjusted her hands- Able to stand more erect with most weakness today in gluteals.       PATIENT EDUCATION: Education details: cues for isolated joint exercises  HOME EXERCISE PROGRAM: No changes at this time  Access Code: [facility] URL: [email or web address] Date: 11/23/2021 Prepared by: Precious Bard Exercises - Seated Gluteal Sets - 2 x daily - 7 x weekly - 2 sets - 10 reps - 5 hold - Seated Quad Set - 2 x daily - 7 x weekly - 2 sets - 10 reps - 5 hold - Seated Long Arc Quad - 2 x daily - 7 x weekly - 2 sets - 10 reps - 5 hold - Seated March - 2 x daily - 7 x weekly - 2 sets - 10 reps - 5 hold - Seated Hip Abduction - 2 x daily - 7 x weekly - 2 sets - 10 reps - 5 hold - Seated Shoulder Shrugs - 2 x daily - 7 x weekly - 2 sets - 10 reps - 5 hold - Wheelchair Pressure Relief - 2 x daily - 7 x weekly - 2 sets - 10 reps - 5  hold  GOALS: Goals reviewed with patient? Yes  SHORT TERM GOALS: Target date: 02/22/2022  Pt will be independent with initial UE strengthening HEP in order to improve strength and balance in order to decrease fall risk and improve function at home and work. Baseline: 10/19/2021- patient with no formal UE HEP; 12/02/2021= Patient verbalized knowledge of HEP including use of theraband for UE strengthening.  Goal status: GOAL MET  LONG TERM GOALS: Target date: 12/29/2022  Pt will be independent with final for UE/LE HEP in order to improve strength and balance in order to decrease fall risk and improve function at home and work. Baseline: Patient is currently BLE NWB and unable to participate in HEP. Has order for UE strengthening. 01/11/2022- Patient now able to participate in LE strengthening although  NWB still- good understanding for some basic exercises. Will keep goal active to incorporate progressive LE strengthening exercises. 04/07/2022= Patient still participating in progressive LE seated HEP and has no questions at this time.  Goal status: MET  2.  Pt will improve FOTO to target score of 35  to display perceived improvements in ability to complete ADL's.  Baseline: 10/19/2021= 12; 12/02/2021= 17; 01/11/2022=17; 04/07/2022= 17; 07/05/2022=17; 10/06/2022- outcome measure not appropriate as patient in non-ambulatory and not consistently standing. Goal status: Goal not appropriate  3.  Pt will increase strength of B UE  by at least 1/2 MMT grade in order to demonstrate improvement in strength and function  Baseline: patient has range of 2-/5 to 4/5 BUE Strength; 12/02/2021= 4/5 except with wrist ext. 01/11/2022= 4/5 B UE strength expept for wrist Goal status: Goal revised-no longer appropriate- working with OT on all UE strengthening.   4. Pt. Will increase strength of RLE by at least 1/2 MMT grade in order to demonstrate improvement in Standing/transfers.  Baseline: 2-/5 Right hip flex/knee ext/flex;  04/07/2022= 2- with right hip flex/knee ext (lacking 28 deg from zero) 4/8= Patient able to ext right knee lacking 18 deg from zero. 07/05/2022=Left knee approx 8 deg from zero and right knee = 23 deg from zero  Goal status: Progressing  5. Pt. Will demo ability to stand pivot transfer with max assist for improved functional mobility and less dependent need on mechanical device.   Baseline: Dependent on hoyer lift for all transfers; 04/07/2022- Unable to test secondary to patient with recent UTI and right hand procedure - will attempt to reassess next visit. ; 04/12/22: Unable to successfully stand due to feet plates on loaned power w/c; 05/10/2022- Patient still having to use rental chair and has not received her original power w/c back to practice SPT. 07/04/2021= Patient just received new power w/c and attempted standing from 1st time today- Patient able to stand with max assist today from w/c and did not attempt to perform pivot transfer- difficulty with static standing and placing weight on right LE. 10/06/2022- Not assessed today- patient needs new AFO prior to attempting transfers  Goal status: ONGOING  6. Pt. Will demonstrate improved functional LE strength as seen by ability to stand > 2 min for improved transfer ability and pregait abilities.   Baseline: not assessed today due to recent dx: UTI; 04/07/2022- Unable to test secondary to patient with recent UTI and right hand procedure - will attempt to reassess next visit; 04/12/22: Unable to successfully stand due to feet plates on loaned power w/c 05/10/2022- Patient still having to use rental chair and has not received her original power w/c back to practice Standing. 07/05/2022- Patient able to stand with max assist at trunk today for 2 min 3 sec. Will keep goal active to ensure consistency. 10/06/2022= Unable to assess today- patient with some recent blood pressure issues and needs new AFO.   Goal status: ONGOING  ASSESSMENT:  CLINICAL IMPRESSION: Patient  presents with good motivation and no pain during today's session. Judy Pimple, [facility] from [place] present to observe patient and made recommendations for brace. Goals were not all reassessed as just tested last week and all still appropriate. She did demo improved standing time overall today.  Patient's condition has the potential to improve in response to therapy. Maximum improvement is yet to be obtained. The anticipated improvement is attainable and reasonable in a generally predictable time.  Pt will continue to benefit from skilled PT services  to optimize independence and reduced caregiver support.    OBJECTIVE IMPAIRMENTS Abnormal gait, decreased activity tolerance, decreased balance, decreased coordination, decreased endurance, decreased mobility, difficulty walking, decreased ROM, decreased strength, hypomobility, impaired flexibility, impaired UE functional use, postural dysfunction, and pain.   ACTIVITY LIMITATIONS carrying, lifting, bending, sitting, standing, squatting, sleeping, stairs, transfers, bed mobility, continence, bathing, toileting, dressing, self feeding, reach over head, hygiene/grooming, and caring for others  PARTICIPATION LIMITATIONS: meal prep, cleaning, laundry, medication management, personal finances, interpersonal relationship, driving, shopping, community activity, and yard work  PERSONAL FACTORS Age, Time since onset of injury/illness/exacerbation, and 1-2 comorbidities: HTN, cervical Sx  are also affecting patient's functional outcome.   REHAB POTENTIAL: Good  CLINICAL DECISION MAKING: Evolving/moderate complexity  EVALUATION COMPLEXITY: Moderate  PLAN: PT FREQUENCY: 1-2x/week  PT DURATION: 12 weeks  PLANNED INTERVENTIONS: Therapeutic exercises, Therapeutic activity, Neuromuscular re-education, Balance training, Gait training, Patient/Family education, Self Care, Joint mobilization, DME instructions, Dry Needling, Electrical stimulation, Wheelchair mobility  training, Spinal mobilization, Cryotherapy, Moist heat, and Manual therapy  PLAN FOR NEXT SESSION: strength, cortraining, motor control postural righting performance endurance   9:17 PM, 10/13/22 Louis Meckel, PT Physical Therapist - [facility]  Outpatient Physical Therapy- Main Campus [phone]

## 2022-10-18 ENCOUNTER — Ambulatory Visit: Payer: Medicare HMO | Admitting: Occupational Therapy

## 2022-10-18 ENCOUNTER — Ambulatory Visit: Payer: Medicare HMO

## 2022-10-20 ENCOUNTER — Ambulatory Visit: Payer: Medicare HMO | Admitting: Occupational Therapy

## 2022-10-20 ENCOUNTER — Ambulatory Visit: Payer: Medicare HMO

## 2022-10-20 DIAGNOSIS — M6281 Muscle weakness (generalized): Secondary | ICD-10-CM

## 2022-10-20 DIAGNOSIS — S14129S Central cord syndrome at unspecified level of cervical spinal cord, sequela: Secondary | ICD-10-CM

## 2022-10-20 DIAGNOSIS — R262 Difficulty in walking, not elsewhere classified: Secondary | ICD-10-CM

## 2022-10-20 DIAGNOSIS — R278 Other lack of coordination: Secondary | ICD-10-CM

## 2022-10-20 DIAGNOSIS — R269 Unspecified abnormalities of gait and mobility: Secondary | ICD-10-CM

## 2022-10-20 NOTE — Therapy (Signed)
Occupational Therapy Neuro Treatment Note   Patient Name: Sherry [name] MRN: [id number] DOB:February 25, 1934, 88 y.o., female Today's Date: 09/20/2022                                 PCP: Dr. Tillman Abide REFERRING PROVIDER: Dr. Earnestine Mealing   OT End of Session - 10/20/22 1200     Visit Number [id number]    Number of Visits 96    Date for OT Re-Evaluation 12/13/22    OT Start Time 1100    OT Stop Time 1145    OT Time Calculation (min) 45 min    Equipment Utilized During Treatment powered wheelchair    Activity Tolerance Patient tolerated treatment well    Behavior During Therapy WFL for tasks assessed/performed               Past Medical History:  Diagnosis Date   Acute blood loss anemia    Arthritis    Cancer (HCC)    skin   Central cord syndrome at C6 level of cervical spinal cord (HCC) 11/29/2017   Hypertension    Protein-calorie malnutrition, severe (HCC) 01/24/2018   S/P insertion of IVC (inferior vena caval) filter 01/24/2018   Tetraparesis (HCC)    Past Surgical History:  Procedure Laterality Date   ANTERIOR CERVICAL DECOMP/DISCECTOMY FUSION N/A 11/29/2017   Procedure: Cervical five-six, six-seven Anterior Cervical Decompression Fusion;  Surgeon: Jadene Pierini, MD;  Location: [facility] OR;  Service: Neurosurgery;  Laterality: N/A;  Cervical five-six, six-seven Anterior Cervical Decompression Fusion   CATARACT EXTRACTION     FEMUR IM NAIL Left 08/16/2021   Procedure: INTRAMEDULLARY (IM) NAIL FEMORAL;  Surgeon: Deeann Saint, MD;  Location: [facility] ORS;  Service: Orthopedics;  Laterality: Left;   IR IVC FILTER PLMT / S&I /IMG GUID/MOD SED  12/08/2017   Patient Active Problem List   Diagnosis Date Noted   Age-related osteoporosis without current pathological fracture 07/27/2022   Mild recurrent major depression (HCC) 07/27/2022   Hypertensive heart disease with other congestive heart failure (HCC) 02/14/2022   Chronic indwelling Foley catheter 02/14/2022   Closed hip  fracture (HCC) 08/15/2021   DVT (deep venous thrombosis) (HCC) 08/15/2021   Quadriplegia (HCC) 08/15/2021   Fall 08/15/2021   Constipation due to slow transit 08/31/2018   Trauma 06/05/2018   Neuropathic pain 06/05/2018   Neurogenic bowel 06/05/2018   Vaginal yeast infection 01/30/2018   Healthcare-associated pneumonia 01/25/2018   Chronic allergic rhinitis 01/24/2018   Depression with anxiety 01/24/2018   UTI due to Klebsiella species 01/24/2018   Protein-calorie malnutrition, severe (HCC) 01/24/2018   S/P insertion of IVC (inferior vena caval) filter 01/24/2018   Tetraparesis (HCC) 01/20/2018   Neurogenic bladder 01/20/2018   Reactive depression    Benign essential HTN    Acute postoperative anemia due to expected blood loss    Central cord syndrome at C6 level of cervical spinal cord (HCC) 11/29/2017   Allergy to alpha-gal 11/25/2016   SCC (squamous cell carcinoma) 04/08/2014    REFERRING DIAG: Central Cord Syndrome at C6 of the Cervical Spinal Cord, Fall with Bilateral Closed Hip Fractures, with ORIF repair with Intramedullary Nailing of the Left Hip Fracture.  THERAPY DIAG:   Muscle weakness (generalized)  Other lack of coordination  Rationale for Evaluation and Treatment Rehabilitation  PERTINENT HISTORY: Patient s/p fall November 29, 2017 resulting in diagnosis of central cord syndrome at C 6 level.  She has  had therapy in multiple venues but with recent move to [facility], therapy staff recommended she seek outpatient therapy for her needs. Pt. Had a recent fall with Bilateral Closed Hip Fractures, with ORIF repair with Intramedullary Nailing of the Left Hip Fracture.   PRECAUTIONS: None  SUBJECTIVE:   Pt. reports doing well, with no pain today.  PAIN: Are you having pain No  OBJECTIVE:    Today's Treatment:    Manual Therapy:   Pt. tolerated soft tissue massage to the volar, and dorsal surface of each digit on the bilateral hands, and thenar eminence  secondary to stiffness following moist heat modality. Soft tissue mobilization was performed for Carpal, and metacarpal spread stretches. Manual therapy was performed independent of, and in preparation for therapeutic Ex.   Therapeutic Ex.    Pt. tolerated PROM followed by AROM bilateral wrist extension, PROM for bilateral digit MP, PIP, and DIP flexion, and extension, thumb radial, and palmar abduction. Emphasis was placed on left wrist extension with moist heat modality 2/2 tightness. Pt. Worked on BUE strengthening, and reciprocal motion using the UBE while seated for 8 min. with no resistance. Constant monitoring was provided.      PATIENT EDUCATION: RUE Distal ROM, and strength Person educated: Patient Education method: Explanation and Demonstration Education comprehension: verbalized understanding, returned demonstration, and needs further education   HOME EXERCISE PROGRAM Continue with ongoing HEPs  Measurements:  12/09/2021:  Shoulder flexion: R: 120(136), L: 138(150) Shoulder abduction: R: 108(120), L: 110(120) Elbow: R: 0-148, L: 0-146 Wrist flexion: R: 55(62), L: 78 Writ extension: R: 34(55), L: 3(4) RD: R: 12(20), L: 16(26) UD: R: 8(24), L: 6(18) Thumb radial abduction: R: 11(20), L: 8(20) Digit flexion to the [facility]: R:  2nd: 5cm(3.5cm), 3rd: 7.5 cm(5cm), 4th: 7cm(3cm), 5th: 6cm(5.5cm) L: 2nd: 8cm(8cm), 3rd: 8cm(8cm), 4th: 7.5cm(7cm), 5th: 7cm(7cm)  02/03/2022:   Shoulder flexion: R: 122(138), L: 148(154) Shoulder abduction: R: 109(125), L: 125(125 Elbow: R: 0-148, L: 0-146 Wrist flexion: R: 60(68), L: 79 Writ extension: R: 40(55), L: 3(10) RD: R: 14(24), L: 20(28) UD: R: 8(24), L: 6(18) Thumb radial abduction: R: 20(25), L: 8(20) Digit flexion to the [facility]: R:  2nd: 4.5cm(3cm), 3rd: 6.5cm(4.5cm), 4th: 6.5 cm(3cm), 5th: 4.5cm(5cm) L: 2nd: 8cm(8cm), 3rd: 8cm(7cm), 4th: 7.5cm(7cm), 5th: 6.5cm(6.5cm)   04/07/2022:   Shoulder flexion: R: 125(150), L:  160(160) Shoulder abduction: R: 109(125), L: 125(125 Elbow: R: 0-148, L: 0-146 Wrist flexion: R: 60(68), L: 79 Writ extension: R: 40(55), L: 5(18) RD: R: 14(24), L: 20(28) UD: R: 18(24), L: 8(18) Thumb radial abduction: R: 20(25), L: 8(20) Digit flexion to the [facility]: R:  2nd: 4.5 cm(4 cm), 3rd: 6.5cm(3cm), 4th: 7 cm (5cm), 5th: 5cm(5cm) L: 2nd: 8cm(8cm), 3rd: 8cm(7cm), 4th: 7.5cm(7cm), 5th: 7cm(7cm)   07/05/2022:   Shoulder flexion: R: 125(154), L: 160(160) Shoulder abduction: R: 95(130), L: 143(143) Elbow: R: 0-148, L: 0-146 Wrist flexion: R: 60(68), L: 79 Writ extension: R: 40(60), L: 0(12) RD: R: 10(24), L: 12(20) UD: R: 16(24), L: 8(16) Thumb radial abduction: R: 20(25), L: 8(20) Digit flexion to the [facility]: R:  2nd: 4 cm(4cm), 3rd: 6.5cm( 3.5cm), 4th: 7 cm (5cm), 5th: 6cm(5cm) L: 2nd: 8cm(8cm), 3rd: 8cm(7cm), 4th: 8cm(7cm), 5th: 7cm(7cm)  09/13/2022:   Shoulder flexion: R: 125(154), L: 160(160) Shoulder abduction: R: 100(130), L: 143(143) Elbow: R: 0-150, L: 0-150 Wrist flexion: R: 55(68), L: 79 Writ extension: R: 45(60), L: 0(12) RD: R: 10(24), L: 12(20) UD: R: 16(24), L: 8(16) Thumb radial abduction: R: 20(25), L: 8(20) Digit  flexion to the [facility]: R:  2nd: 4 cm(4cm), 3rd: 6cm( 5cm), 4th: 6 cm (4cm), 5th: 5cm(5cm) L: 2nd: 7cm(cm), 3rd: 7cm(6cm), 4th: 7cm(6cm), 5th: 6cm(6cm)      OT Long Term Goals - 07/06/21 1106                                                                                          Target Date: 12/13/2022      OT LONG TERM GOAL #1   Title Patient and caregiver will demonstrate understanding of home exercise program for ROM.    Baseline 12/09/2021: Pt. Reports the restorative ROM program has not resumed at the facility. 10/28/2021: Pt. Is attempting exercises/stretches at home. The facility restorative aides have not resumed exercises. 09/14/2021:  Pt. Reports inconsistencies with restorative ROM at  her care home. Pt/caregiver education to be provided  about ongoing ROM needs.    Time 12    Period Weeks    Status Deferred     OT LONG TERM GOAL #2   Title Pt.  will improve BUE strength to be able to sustain her UE's in elevation to efficiently, and effectively perform hair care, and self-grooming tasks.   Baseline 02/03/2022: Pt. has improved, and is now able to sustain her Bilateral UEs in elevation long enough to efficiently, and effectively perform haircare, and self-grooming tasks. 12/09/2021: right shoulder 4-/5, left shoulder 4/5. Pt. has improved to be able to reach up for hair care, and self-grooming tasks. 10/28/2021: Right shoulder 3+/5, Left shoulder 4-/5 09/14/2021:  BUE shoulder strength: 3+/5 for shoulder flexion, and abduction.   Time 12    Period Weeks    Status Achieved               OT LONG TERM GOAL #3   Title Patient will demonstrate improved composite finger flexion to be able to firmly hold  and use adaptive devices during ADLs, and IADLs.   Baseline 09/20/2022: Improving digit flexion to the [facility]. 09/13/22: improved digit flexion to the [facility]. 07/05/2022: Pt. presents with digit MP, PIP, and DIP extensor tightness limiting her ability to securely grip objects in her bilateral hands.  04/07/2022: Pt. Has improved with right 2nd, and 3rd digit flexion towards the [facility]. Pt. Continues to have difficulty securely holding and applying deodorant.02/03/2022: Pt. Has improved with digit flexion, however continues to have difficulty securely holding and using deoderant. 12/09/2021: Bilateral hand/digit MP, PIP, and DIP extension tightness limits her ability achieve digit flexion to hold, and apply deodorant. 10/28/2021:  Pt. continues to have difficulty holding the deodorant. 01/12/2021: Pt. Presents with limited digit extension. Pt. Is able to initiate holding deodorant, however is unable to hold it while using it.   Time 12    Period Weeks    Status Revised   Target Date                  OT LONG TERM GOAL #4   Title Pt. Will improve  bilateral wrist extension in preparation for anticipating, and initiating reaching for objects at the table.    Baseline 09/20/2022: limited PROM in the Left wrist extension 2/2 tightness/stiffness. 09/13/22:  R: 55(68), L: 79 07/19/2022: Bilateral wrist extension in limited. 07/05/2022: Right: 40(60) Left: 0(12) 04/07/2022: Rght: 40(55) Left: 5(18) 02/03/2022: Right: 34(55) Left: 3(4) 12/09/2021: Right  34(55)  Left  3(4) 10/28/2021:  Right 22(38), Left 0(15) 09/14/2021:  Right: 17(35), left 2(15)   Time 12   Period weeks   Status Ongoing   Target Date      OT LONG TERM GOAL #5   Title Pt. will write, and sign her name with 100% legibility, and modified independence.    Baseline 09/13/22: 100% legibility to sign name, intermittent tremors 07/19/2022:  Pt. continues to be able to consistently fill out daily meal menus, and puzzles. 6/03/204: Pt. Is consistently filling out  daily meal menus, and puzzles. 04/07/2022: Pt. Reports that she continues to present with shaky appearing writing. Pt.reports that she is writing more when filling out her menu, and complete puzzles 02/03/2022: Pt. Continues to report legibility appears "shaky" when attempting signing her name. Pt.reports that she is writing more when filling out her menu, and complete puzzles.12/09/2021: Pt. Reports legibility appears "shaky" when attempting signing her name. Pt.reports that she is writing more when filling out her menu. 10/28/2021: pt. is able to sign her name, however legibility continues to be inconsistent. Pt. Reports "shaky appearance". 09/14/2021: Pt. continues to fill out her daily menu and complete puzzles. Pt. Continues to present with limited writing legibility.   Time 12    Period Weeks    Status  Achieved         OT LONG TERM GOAL  #6   TITLE Pt. will turn pages in a book with modified independence.    Baseline 02/03/2022: Pt. Is able to effectively turn pages of a book. 10/28/2021: Pt. Is able to consistently turn book pages.  09/14/2021: Pt. has difficulty turning pages.    Time 12    Period Weeks    Status Achieved       OT LONG TERM GOAL  #7  TITLE Pt. Will increase  bilateral lateral pinch strength by 2 lbs to be able to securely grasp items during ADLs, and IADL tasks.   Baseline 09/20/2022: lateral pinch continues to be limited 09/13/22: R 6.5# L 4# 07/19/2022: TBD 07/05/2022: TBD 07/2022: NT-Pinch meter out for calibration. 02/03/2022: Right: 6#, Left: 4# 12/09/2021:  Right: 6#, Left: 4#   Time 12  Period Weeks  Status Ongoing           OT LONG TERM GOAL  #8  TITLE Pt. Will button a sweater with large 1" buttons, and zip her jacket with modified independence.   Baseline 06/07/2022: Pt. is able to use a button hook to button a sweater. Pt. Reports that she does not wear button down clothing often.  04/07/2022: Pt. Has difficulty manipulating buttons, however was introduced to a buttonhook. Pt. Was able to hold, and use a buttonhook for buttons on a shirt. 02/03/2022: Pt. is unable to manipulate buttons on her sweater, or zippers on her jacket.  Time 12  Period Weeks  Status Achieved   OT LONG TERM GOAL  #9  TITLE Pt. will complete plant care with modified independence.  Baseline 09/20/2022: Pt. Continues to have difficulty reaching for thorough plant care. 09/13/22: continues to report intermittent difficulty 07/19/2022: Pt. Continues to be able to water, and care for some  of her plants. Pt. Has more difficulty with plants that are harder to reach. 07/05/2022: pt. Is able to water, and care for some  of her plants.  Pt. Has more difficulty with plants that are harder to reach. 04/07/2022: Pt. is now able to hold a cup and water her plants.02/03/2022: Pt. has difficulty caring for her plants.   Time 12  Period Weeks  Status Ongoing   OT LONG TERM GOAL  #10  TITLE Pt. will demonstrate adaptive techniques to assist with the efficiency of self-dressing, or morning care tasks.  Baseline 09/20/2022: Pt. continues to  require assist form personal, and staff care aides. 09/13/22: MOD A dressing, reports she is often rushed 07/19/2022: Pt. continues to require assist with self-dressing/morning care tasks.   07/05/2022: Continue with goal.  04/07/2022: Pt. Continues to require assist with the efficiency of self-dressing, and morning care tasks.02/03/2022: Pt. requires assist from caregivers 2/2 time limitations during morning care.   Time 12  Period Weeks  Status Ongoing  OT LONG TERM GOAL  #11 Title Pt.  will improve BUE strength to improve ADL, and IADL functioning.  Baseline 09/20/2022: Pt. Presents with limited BUE strength 09/13/22: shoulder flexion L 4/5, R 3/5; elbow flexion B 5/5, elbow extension 4-/5; shoulder abduction L 4+/5, R 3+/5. 07/19/2022:  BUE strength continues to be limited. 07/05/2022: shoulder flexion: right 4-/5, abduction: 3+/5, elbow flexion: right: 5/5, left 5/5, extension: right: 4-/5, left 4-/5, wrist extension: right: 3-/5, left: 2-/5  Time 12   Period Weeks   Status Ongoing     Plan - 07/22/21 1737     Clinical Impression Pt. continues to tolerate manual therapy, there. Ex., and ROM well today. Pt. was able to tolerate UE strengthening with 2.5# dowel. Pt. presents with tightness at the bilateral thumb webspace, and bilateral wrist flexor tightness, as well as digit MP, PIP, and DIP extensor tightness which continues to limit her ability to complete ADL tasks efficiently. Pt. continues to benefit from working on impoving ROM in order to work towards increasing bilateral hand grasp on objects, and increase engagement of bilateral hands during ADLs, and IADL tasks.       OT Occupational Profile and History Detailed Assessment- Review of Records and additional review of physical, cognitive, psychosocial history related to current functional performance    Occupational performance deficits (Please refer to evaluation for details): ADL's;IADL's;Leisure;Social Participation    Body Structure /  Function / Physical Skills ADL;Continence;Dexterity;Flexibility;Strength;ROM;Balance;Coordination;[facility]MC;IADL;Endurance;UE functional use;Decreased knowledge of use of DME;[facility]    Psychosocial Skills Coping Strategies    Rehab Potential Fair    Clinical Decision Making Several treatment options, min-mod task modification necessary    Comorbidities Affecting Occupational Performance: Presence of comorbidities impacting occupational performance    Comorbidities impacting occupational performance description: contractures of bilateral hands, dependent transfers    Modification or Assistance to Complete Evaluation  No modification of tasks or assist necessary to complete eval    OT Frequency 2x / week    OT Duration 12 weeks    OT Treatment/Interventions Self-care/ADL training;Cryotherapy;Therapeutic exercise;DME and/or AE instruction;Balance training;Neuromuscular education;Manual Therapy;Splinting;Moist Heat;Passive range of motion;Therapeutic activities;Patient/family education    Plan continue to progress ROM of digits, wrists and shoulders as it pertains to completion of ADL tasks that pt values.    Consulted and Agree with Plan of Care Patient         Olegario Messier, MS, OTR/L  10/20/22, 3:12 PM

## 2022-10-20 NOTE — Therapy (Signed)
OUTPATIENT PHYSICAL THERAPY NEURO TREATMENT  Patient Name: Sherry [name] MRN: [id number] DOB:13-Oct-1934, 88 y.o., female Today's Date: 10/20/2022   PCP: Earnestine Mealing, MD REFERRING PROVIDER: Deeann Saint   PT End of Session - 10/20/22 1145     Visit Number 71    Number of Visits 92    Date for PT Re-Evaluation 12/29/22    Progress Note Due on Visit 80    PT Start Time 1146    PT Stop Time 1228    PT Time Calculation (min) 42 min    Equipment Utilized During Treatment Gait belt    Activity Tolerance Patient tolerated treatment well;No increased pain;Patient limited by fatigue    Behavior During Therapy [facility] for tasks assessed/performed              Past Medical History:  Diagnosis Date   Acute blood loss anemia    Arthritis    Cancer (HCC)    skin   Central cord syndrome at C6 level of cervical spinal cord (HCC) 11/29/2017   Hypertension    Protein-calorie malnutrition, severe (HCC) 01/24/2018   S/P insertion of IVC (inferior vena caval) filter 01/24/2018   Tetraparesis (HCC)    Past Surgical History:  Procedure Laterality Date   ANTERIOR CERVICAL DECOMP/DISCECTOMY FUSION N/A 11/29/2017   Procedure: Cervical five-six, six-seven Anterior Cervical Decompression Fusion;  Surgeon: Jadene Pierini, MD;  Location: [facility] OR;  Service: Neurosurgery;  Laterality: N/A;  Cervical five-six, six-seven Anterior Cervical Decompression Fusion   CATARACT EXTRACTION     FEMUR IM NAIL Left 08/16/2021   Procedure: INTRAMEDULLARY (IM) NAIL FEMORAL;  Surgeon: Deeann Saint, MD;  Location: [facility] ORS;  Service: Orthopedics;  Laterality: Left;   IR IVC FILTER PLMT / S&I /IMG GUID/MOD SED  12/08/2017   Patient Active Problem List   Diagnosis Date Noted   Age-related osteoporosis without current pathological fracture 07/27/2022   Mild recurrent major depression (HCC) 07/27/2022   Hypertensive heart disease with other congestive heart failure (HCC) 02/14/2022   Chronic indwelling  Foley catheter 02/14/2022   Closed hip fracture (HCC) 08/15/2021   DVT (deep venous thrombosis) (HCC) 08/15/2021   Quadriplegia (HCC) 08/15/2021   Fall 08/15/2021   Constipation due to slow transit 08/31/2018   Trauma 06/05/2018   Neuropathic pain 06/05/2018   Neurogenic bowel 06/05/2018   Vaginal yeast infection 01/30/2018   Healthcare-associated pneumonia 01/25/2018   Chronic allergic rhinitis 01/24/2018   Depression with anxiety 01/24/2018   UTI due to Klebsiella species 01/24/2018   Protein-calorie malnutrition, severe (HCC) 01/24/2018   S/P insertion of IVC (inferior vena caval) filter 01/24/2018   Tetraparesis (HCC) 01/20/2018   Neurogenic bladder 01/20/2018   Reactive depression    Benign essential HTN    Acute postoperative anemia due to expected blood loss    Central cord syndrome at C6 level of cervical spinal cord (HCC) 11/29/2017   Allergy to alpha-gal 11/25/2016   SCC (squamous cell carcinoma) 04/08/2014    ONSET DATE: 08/15/2021 (fall with B hip fx); Initial injury was in 2019- quadraplegia due to central cord syndrome of C6 secondary to neck fracture.   REFERRING DIAG: Bilateral Hip fx; Left Open- s/p Left IM nail femoral on 08/16/2021; Right closed  THERAPY DIAG:  Muscle weakness (generalized)  Other lack of coordination  Central cord syndrome, sequela (HCC)  Abnormality of gait and mobility  Difficulty in walking, not elsewhere classified  Rationale for Evaluation and Treatment Rehabilitation  SUBJECTIVE:   SUBJECTIVE STATEMENT:  Patient reports some  left thigh soreness over past week- not specifically today.     Pt accompanied by:  Paid caregiver-   PERTINENT HISTORY: Sherry [name] is an 87yoF who experienced a fall at home on 08/15/2021 with hip fracture, s/p Left hip ORIF. PMH: quadriplegia due to central cord syndrome of C6 secondary to neck fracture, HTN, depression with anxiety, neurogenic bladder, bilateral DVT on Xarelto (s/p of IVC filter  placement). Prior history of significant PT/OT with improvement of function. Pt has full use of shoulder/elbows, limited use of hands, adaptive self feeding sucessful.     PAIN:  None   OBJECTIVE:    TODAY'S TREATMENT:  10/20/22    Therex:  PROM to left quad- hold 30 sec x 3 Seated hip march x 15 reps each LE Seated knee ext  2sets of 15 reps each LE   THERAPEUTIC ACTIVITIES:     Sit to stand in // bars- Max A +1 then another person (patient's paid caregiver)  to assist to keep gluteals engaged- pressing on bottom at times if patient lost her erect standing. She performed 5 trials of standing today from to 2 min. Each trial focusing on anterior weight shift with hips to position over toes. Patient did report increased pressure on left achilles yet no increased pain.  PT continues to block patient's foot from slipping forward.      PATIENT EDUCATION: Education details: cues for isolated joint exercises  HOME EXERCISE PROGRAM: No changes at this time  Access Code: [facility] URL: [email or web address] Date: 11/23/2021 Prepared by: Precious Bard Exercises - Seated Gluteal Sets - 2 x daily - 7 x weekly - 2 sets - 10 reps - 5 hold - Seated Quad Set - 2 x daily - 7 x weekly - 2 sets - 10 reps - 5 hold - Seated Long Arc Quad - 2 x daily - 7 x weekly - 2 sets - 10 reps - 5 hold - Seated March - 2 x daily - 7 x weekly - 2 sets - 10 reps - 5 hold - Seated Hip Abduction - 2 x daily - 7 x weekly - 2 sets - 10 reps - 5 hold - Seated Shoulder Shrugs - 2 x daily - 7 x weekly - 2 sets - 10 reps - 5 hold - Wheelchair Pressure Relief - 2 x daily - 7 x weekly - 2 sets - 10 reps - 5 hold  GOALS: Goals reviewed with patient? Yes  SHORT TERM GOALS: Target date: 02/22/2022  Pt will be independent with initial UE strengthening HEP in order to improve strength and balance in order to decrease fall risk and improve function at home and work. Baseline: 10/19/2021- patient with no formal  UE HEP; 12/02/2021= Patient verbalized knowledge of HEP including use of theraband for UE strengthening.  Goal status: GOAL MET  LONG TERM GOALS: Target date: 12/29/2022  Pt will be independent with final for UE/LE HEP in order to improve strength and balance in order to decrease fall risk and improve function at home and work. Baseline: Patient is currently BLE NWB and unable to participate in HEP. Has order for UE strengthening. 01/11/2022- Patient now able to participate in LE strengthening although NWB still- good understanding for some basic exercises. Will keep goal active to incorporate progressive LE strengthening exercises. 04/07/2022= Patient still participating in progressive LE seated HEP and has no questions at this time.  Goal status: MET  2.  Pt will improve FOTO to target score of 35  to display perceived improvements in ability to complete ADL's.  Baseline: 10/19/2021= 12; 12/02/2021= 17; 01/11/2022=17; 04/07/2022= 17; 07/05/2022=17; 10/06/2022- outcome measure not appropriate as patient in non-ambulatory and not consistently standing. Goal status: Goal not appropriate  3.  Pt will increase strength of B UE  by at least 1/2 MMT grade in order to demonstrate improvement in strength and function  Baseline: patient has range of 2-/5 to 4/5 BUE Strength; 12/02/2021= 4/5 except with wrist ext. 01/11/2022= 4/5 B UE strength expept for wrist Goal status: Goal revised-no longer appropriate- working with OT on all UE strengthening.   4. Pt. Will increase strength of RLE by at least 1/2 MMT grade in order to demonstrate improvement in Standing/transfers.  Baseline: 2-/5 Right hip flex/knee ext/flex; 04/07/2022= 2- with right hip flex/knee ext (lacking 28 deg from zero) 4/8= Patient able to ext right knee lacking 18 deg from zero. 07/05/2022=Left knee approx 8 deg from zero and right knee = 23 deg from zero  Goal status: Progressing  5. Pt. Will demo ability to stand pivot transfer with max assist for  improved functional mobility and less dependent need on mechanical device.   Baseline: Dependent on hoyer lift for all transfers; 04/07/2022- Unable to test secondary to patient with recent UTI and right hand procedure - will attempt to reassess next visit. ; 04/12/22: Unable to successfully stand due to feet plates on loaned power w/c; 05/10/2022- Patient still having to use rental chair and has not received her original power w/c back to practice SPT. 07/04/2021= Patient just received new power w/c and attempted standing from 1st time today- Patient able to stand with max assist today from w/c and did not attempt to perform pivot transfer- difficulty with static standing and placing weight on right LE. 10/06/2022- Not assessed today- patient needs new AFO prior to attempting transfers  Goal status: ONGOING  6. Pt. Will demonstrate improved functional LE strength as seen by ability to stand > 2 min for improved transfer ability and pregait abilities.   Baseline: not assessed today due to recent dx: UTI; 04/07/2022- Unable to test secondary to patient with recent UTI and right hand procedure - will attempt to reassess next visit; 04/12/22: Unable to successfully stand due to feet plates on loaned power w/c 05/10/2022- Patient still having to use rental chair and has not received her original power w/c back to practice Standing. 07/05/2022- Patient able to stand with max assist at trunk today for 2 min 3 sec. Will keep goal active to ensure consistency. 10/06/2022= Unable to assess today- patient with some recent blood pressure issues and needs new AFO.   Goal status: ONGOING  ASSESSMENT:  CLINICAL IMPRESSION: Patient presented with good motivation and demonstrated improved overall time on feet today. She initially stood well- still some excessive left knee hyperextension and then some left knee buckling on last 2 trials due to fatigue weakness. Good endurance overall compared to previous visits.  Pt will continue to  benefit from skilled PT services to optimize independence and reduced caregiver support.    OBJECTIVE IMPAIRMENTS Abnormal gait, decreased activity tolerance, decreased balance, decreased coordination, decreased endurance, decreased mobility, difficulty walking, decreased ROM, decreased strength, hypomobility, impaired flexibility, impaired UE functional use, postural dysfunction, and pain.   ACTIVITY LIMITATIONS carrying, lifting, bending, sitting, standing, squatting, sleeping, stairs, transfers, bed mobility, continence, bathing, toileting, dressing, self feeding, reach over head, hygiene/grooming, and caring for others  PARTICIPATION LIMITATIONS: meal prep, cleaning, laundry, medication management, personal finances, interpersonal relationship, driving,  shopping, community activity, and yard work  PERSONAL FACTORS Age, Time since onset of injury/illness/exacerbation, and 1-2 comorbidities: HTN, cervical Sx  are also affecting patient's functional outcome.   REHAB POTENTIAL: Good  CLINICAL DECISION MAKING: Evolving/moderate complexity  EVALUATION COMPLEXITY: Moderate  PLAN: PT FREQUENCY: 1-2x/week  PT DURATION: 12 weeks  PLANNED INTERVENTIONS: Therapeutic exercises, Therapeutic activity, Neuromuscular re-education, Balance training, Gait training, Patient/Family education, Self Care, Joint mobilization, DME instructions, Dry Needling, Electrical stimulation, Wheelchair mobility training, Spinal mobilization, Cryotherapy, Moist heat, and Manual therapy  PLAN FOR NEXT SESSION: strength, cortraining, motor control postural righting performance endurance   5:41 PM, 10/20/22 Louis Meckel, PT Physical Therapist - [facility]  Outpatient Physical Therapy- Main Campus [phone]

## 2022-10-25 ENCOUNTER — Ambulatory Visit: Payer: Medicare HMO | Admitting: Occupational Therapy

## 2022-10-25 ENCOUNTER — Ambulatory Visit: Payer: Medicare HMO

## 2022-10-25 DIAGNOSIS — S72001A Fracture of unspecified part of neck of right femur, initial encounter for closed fracture: Secondary | ICD-10-CM

## 2022-10-25 DIAGNOSIS — M6281 Muscle weakness (generalized): Secondary | ICD-10-CM

## 2022-10-25 DIAGNOSIS — R262 Difficulty in walking, not elsewhere classified: Secondary | ICD-10-CM

## 2022-10-25 DIAGNOSIS — R278 Other lack of coordination: Secondary | ICD-10-CM

## 2022-10-25 DIAGNOSIS — S14129S Central cord syndrome at unspecified level of cervical spinal cord, sequela: Secondary | ICD-10-CM

## 2022-10-25 DIAGNOSIS — R269 Unspecified abnormalities of gait and mobility: Secondary | ICD-10-CM

## 2022-10-25 NOTE — Therapy (Addendum)
Occupational Therapy Neuro Treatment Note   Patient Name: Sherry [name] MRN: [id number] DOB:15-May-1934, 88 y.o., female Today's Date: 09/20/2022                                 PCP: Dr. Tillman Abide REFERRING PROVIDER: Dr. Earnestine Mealing   OT End of Session - 10/25/22 1507     Visit Number [id number]    Number of Visits 96    Date for OT Re-Evaluation 12/13/22    Authorization Time Period Progress report period starting 0812/2024    OT Start Time 1100    OT Stop Time 1145    OT Time Calculation (min) 45 min    Activity Tolerance Patient tolerated treatment well    Behavior During Therapy [facility] for tasks assessed/performed               Past Medical History:  Diagnosis Date   Acute blood loss anemia    Arthritis    Cancer (HCC)    skin   Central cord syndrome at C6 level of cervical spinal cord (HCC) 11/29/2017   Hypertension    Protein-calorie malnutrition, severe (HCC) 01/24/2018   S/P insertion of IVC (inferior vena caval) filter 01/24/2018   Tetraparesis (HCC)    Past Surgical History:  Procedure Laterality Date   ANTERIOR CERVICAL DECOMP/DISCECTOMY FUSION N/A 11/29/2017   Procedure: Cervical five-six, six-seven Anterior Cervical Decompression Fusion;  Surgeon: Jadene Pierini, MD;  Location: [facility] OR;  Service: Neurosurgery;  Laterality: N/A;  Cervical five-six, six-seven Anterior Cervical Decompression Fusion   CATARACT EXTRACTION     FEMUR IM NAIL Left 08/16/2021   Procedure: INTRAMEDULLARY (IM) NAIL FEMORAL;  Surgeon: Deeann Saint, MD;  Location: [facility] ORS;  Service: Orthopedics;  Laterality: Left;   IR IVC FILTER PLMT / S&I /IMG GUID/MOD SED  12/08/2017   Patient Active Problem List   Diagnosis Date Noted   Age-related osteoporosis without current pathological fracture 07/27/2022   Mild recurrent major depression (HCC) 07/27/2022   Hypertensive heart disease with other congestive heart failure (HCC) 02/14/2022   Chronic indwelling Foley catheter 02/14/2022    Closed hip fracture (HCC) 08/15/2021   DVT (deep venous thrombosis) (HCC) 08/15/2021   Quadriplegia (HCC) 08/15/2021   Fall 08/15/2021   Constipation due to slow transit 08/31/2018   Trauma 06/05/2018   Neuropathic pain 06/05/2018   Neurogenic bowel 06/05/2018   Vaginal yeast infection 01/30/2018   Healthcare-associated pneumonia 01/25/2018   Chronic allergic rhinitis 01/24/2018   Depression with anxiety 01/24/2018   UTI due to Klebsiella species 01/24/2018   Protein-calorie malnutrition, severe (HCC) 01/24/2018   S/P insertion of IVC (inferior vena caval) filter 01/24/2018   Tetraparesis (HCC) 01/20/2018   Neurogenic bladder 01/20/2018   Reactive depression    Benign essential HTN    Acute postoperative anemia due to expected blood loss    Central cord syndrome at C6 level of cervical spinal cord (HCC) 11/29/2017   Allergy to alpha-gal 11/25/2016   SCC (squamous cell carcinoma) 04/08/2014    REFERRING DIAG: Central Cord Syndrome at C6 of the Cervical Spinal Cord, Fall with Bilateral Closed Hip Fractures, with ORIF repair with Intramedullary Nailing of the Left Hip Fracture.  THERAPY DIAG:   Muscle weakness (generalized)  Other lack of coordination  Rationale for Evaluation and Treatment Rehabilitation  PERTINENT HISTORY: Patient s/p fall November 29, 2017 resulting in diagnosis of central cord syndrome at C 6 level.  She has had therapy in multiple venues but with recent move to [facility], therapy staff recommended she seek outpatient therapy for her needs. Pt. Had a recent fall with Bilateral Closed Hip Fractures, with ORIF repair with Intramedullary Nailing of the Left Hip Fracture.   PRECAUTIONS: None  SUBJECTIVE:   Pt. reported having a good weekend watching football games.  PAIN: Are you having pain No  OBJECTIVE:    Today's Treatment:    Manual Therapy:   Pt. tolerated soft tissue massage to the volar, and dorsal surface of each digit on the bilateral  hands, and thenar eminence secondary to stiffness following moist heat modality. Soft tissue mobilization was performed for Carpal, and metacarpal spread stretches. Manual therapy was performed independent of, and in preparation for therapeutic Ex.   Therapeutic Ex.    Pt. tolerated PROM followed by AROM bilateral wrist extension, PROM for bilateral digit MP, PIP, and DIP flexion, and extension, thumb radial, and palmar abduction. Emphasis was placed on left wrist extension with moist heat modality 2/2 tightness. Pt. Worked on BUE strengthening, and reciprocal motion using the UBE while seated for 8 min. with no resistance. Constant monitoring was provided.      PATIENT EDUCATION: RUE Distal ROM, and strength Person educated: Patient Education method: Explanation and Demonstration Education comprehension: verbalized understanding, returned demonstration, and needs further education   HOME EXERCISE PROGRAM Continue with ongoing HEPs  Measurements:  12/09/2021:  Shoulder flexion: R: 120(136), L: 138(150) Shoulder abduction: R: 108(120), L: 110(120) Elbow: R: 0-148, L: 0-146 Wrist flexion: R: 55(62), L: 78 Writ extension: R: 34(55), L: 3(4) RD: R: 12(20), L: 16(26) UD: R: 8(24), L: 6(18) Thumb radial abduction: R: 11(20), L: 8(20) Digit flexion to the [facility]: R:  2nd: 5cm(3.5cm), 3rd: 7.5 cm(5cm), 4th: 7cm(3cm), 5th: 6cm(5.5cm) L: 2nd: 8cm(8cm), 3rd: 8cm(8cm), 4th: 7.5cm(7cm), 5th: 7cm(7cm)  02/03/2022:   Shoulder flexion: R: 122(138), L: 148(154) Shoulder abduction: R: 109(125), L: 125(125 Elbow: R: 0-148, L: 0-146 Wrist flexion: R: 60(68), L: 79 Writ extension: R: 40(55), L: 3(10) RD: R: 14(24), L: 20(28) UD: R: 8(24), L: 6(18) Thumb radial abduction: R: 20(25), L: 8(20) Digit flexion to the [facility]: R:  2nd: 4.5cm(3cm), 3rd: 6.5cm(4.5cm), 4th: 6.5 cm(3cm), 5th: 4.5cm(5cm) L: 2nd: 8cm(8cm), 3rd: 8cm(7cm), 4th: 7.5cm(7cm), 5th: 6.5cm(6.5cm)   04/07/2022:   Shoulder flexion: R:  125(150), L: 160(160) Shoulder abduction: R: 109(125), L: 125(125 Elbow: R: 0-148, L: 0-146 Wrist flexion: R: 60(68), L: 79 Writ extension: R: 40(55), L: 5(18) RD: R: 14(24), L: 20(28) UD: R: 18(24), L: 8(18) Thumb radial abduction: R: 20(25), L: 8(20) Digit flexion to the [facility]: R:  2nd: 4.5 cm(4 cm), 3rd: 6.5cm(3cm), 4th: 7 cm (5cm), 5th: 5cm(5cm) L: 2nd: 8cm(8cm), 3rd: 8cm(7cm), 4th: 7.5cm(7cm), 5th: 7cm(7cm)   07/05/2022:   Shoulder flexion: R: 125(154), L: 160(160) Shoulder abduction: R: 95(130), L: 143(143) Elbow: R: 0-148, L: 0-146 Wrist flexion: R: 60(68), L: 79 Writ extension: R: 40(60), L: 0(12) RD: R: 10(24), L: 12(20) UD: R: 16(24), L: 8(16) Thumb radial abduction: R: 20(25), L: 8(20) Digit flexion to the [facility]: R:  2nd: 4 cm(4cm), 3rd: 6.5cm( 3.5cm), 4th: 7 cm (5cm), 5th: 6cm(5cm) L: 2nd: 8cm(8cm), 3rd: 8cm(7cm), 4th: 8cm(7cm), 5th: 7cm(7cm)  09/13/2022:   Shoulder flexion: R: 125(154), L: 160(160) Shoulder abduction: R: 100(130), L: 143(143) Elbow: R: 0-150, L: 0-150 Wrist flexion: R: 55(68), L: 79 Writ extension: R: 45(60), L: 0(12) RD: R: 10(24), L: 12(20) UD: R: 16(24), L: 8(16) Thumb radial abduction: R: 20(25),  L: 8(20) Digit flexion to the [facility]: R:  2nd: 4 cm(4cm), 3rd: 6cm( 5cm), 4th: 6 cm (4cm), 5th: 5cm(5cm) L: 2nd: 7cm(cm), 3rd: 7cm(6cm), 4th: 7cm(6cm), 5th: 6cm(6cm)      OT Long Term Goals - 07/06/21 1106                                                                                          Target Date: 12/13/2022      OT LONG TERM GOAL #1   Title Patient and caregiver will demonstrate understanding of home exercise program for ROM.    Baseline 12/09/2021: Pt. Reports the restorative ROM program has not resumed at the facility. 10/28/2021: Pt. Is attempting exercises/stretches at home. The facility restorative aides have not resumed exercises. 09/14/2021:  Pt. Reports inconsistencies with restorative ROM at  her care home. Pt/caregiver education to  be provided about ongoing ROM needs.    Time 12    Period Weeks    Status Deferred     OT LONG TERM GOAL #2   Title Pt.  will improve BUE strength to be able to sustain her UE's in elevation to efficiently, and effectively perform hair care, and self-grooming tasks.   Baseline 02/03/2022: Pt. has improved, and is now able to sustain her Bilateral UEs in elevation long enough to efficiently, and effectively perform haircare, and self-grooming tasks. 12/09/2021: right shoulder 4-/5, left shoulder 4/5. Pt. has improved to be able to reach up for hair care, and self-grooming tasks. 10/28/2021: Right shoulder 3+/5, Left shoulder 4-/5 09/14/2021:  BUE shoulder strength: 3+/5 for shoulder flexion, and abduction.   Time 12    Period Weeks    Status Achieved               OT LONG TERM GOAL #3   Title Patient will demonstrate improved composite finger flexion to be able to firmly hold  and use adaptive devices during ADLs, and IADLs.   Baseline 09/20/2022: Improving digit flexion to the [facility]. 09/13/22: improved digit flexion to the [facility]. 07/05/2022: Pt. presents with digit MP, PIP, and DIP extensor tightness limiting her ability to securely grip objects in her bilateral hands.  04/07/2022: Pt. Has improved with right 2nd, and 3rd digit flexion towards the [facility]. Pt. Continues to have difficulty securely holding and applying deodorant.02/03/2022: Pt. Has improved with digit flexion, however continues to have difficulty securely holding and using deoderant. 12/09/2021: Bilateral hand/digit MP, PIP, and DIP extension tightness limits her ability achieve digit flexion to hold, and apply deodorant. 10/28/2021:  Pt. continues to have difficulty holding the deodorant. 01/12/2021: Pt. Presents with limited digit extension. Pt. Is able to initiate holding deodorant, however is unable to hold it while using it.   Time 12    Period Weeks    Status Revised   Target Date                  OT LONG TERM GOAL #4   Title Pt. Will  improve bilateral wrist extension in preparation for anticipating, and initiating reaching for objects at the table.    Baseline 09/20/2022: limited PROM in the Left wrist extension  2/2 tightness/stiffness. 09/13/22: R: 55(68), L: 79 07/19/2022: Bilateral wrist extension in limited. 07/05/2022: Right: 40(60) Left: 0(12) 04/07/2022: Rght: 40(55) Left: 5(18) 02/03/2022: Right: 34(55) Left: 3(4) 12/09/2021: Right  34(55)  Left  3(4) 10/28/2021:  Right 22(38), Left 0(15) 09/14/2021:  Right: 17(35), left 2(15)   Time 12   Period weeks   Status Ongoing   Target Date      OT LONG TERM GOAL #5   Title Pt. will write, and sign her name with 100% legibility, and modified independence.    Baseline 09/13/22: 100% legibility to sign name, intermittent tremors 07/19/2022:  Pt. continues to be able to consistently fill out daily meal menus, and puzzles. 6/03/204: Pt. Is consistently filling out  daily meal menus, and puzzles. 04/07/2022: Pt. Reports that she continues to present with shaky appearing writing. Pt.reports that she is writing more when filling out her menu, and complete puzzles 02/03/2022: Pt. Continues to report legibility appears "shaky" when attempting signing her name. Pt.reports that she is writing more when filling out her menu, and complete puzzles.12/09/2021: Pt. Reports legibility appears "shaky" when attempting signing her name. Pt.reports that she is writing more when filling out her menu. 10/28/2021: pt. is able to sign her name, however legibility continues to be inconsistent. Pt. Reports "shaky appearance". 09/14/2021: Pt. continues to fill out her daily menu and complete puzzles. Pt. Continues to present with limited writing legibility.   Time 12    Period Weeks    Status  Achieved         OT LONG TERM GOAL  #6   TITLE Pt. will turn pages in a book with modified independence.    Baseline 02/03/2022: Pt. Is able to effectively turn pages of a book. 10/28/2021: Pt. Is able to consistently turn book  pages. 09/14/2021: Pt. has difficulty turning pages.    Time 12    Period Weeks    Status Achieved       OT LONG TERM GOAL  #7  TITLE Pt. Will increase  bilateral lateral pinch strength by 2 lbs to be able to securely grasp items during ADLs, and IADL tasks.   Baseline 09/20/2022: lateral pinch continues to be limited 09/13/22: R 6.5# L 4# 07/19/2022: TBD 07/05/2022: TBD 07/2022: NT-Pinch meter out for calibration. 02/03/2022: Right: 6#, Left: 4# 12/09/2021:  Right: 6#, Left: 4#   Time 12  Period Weeks  Status Ongoing           OT LONG TERM GOAL  #8  TITLE Pt. Will button a sweater with large 1" buttons, and zip her jacket with modified independence.   Baseline 06/07/2022: Pt. is able to use a button hook to button a sweater. Pt. Reports that she does not wear button down clothing often.  04/07/2022: Pt. Has difficulty manipulating buttons, however was introduced to a buttonhook. Pt. Was able to hold, and use a buttonhook for buttons on a shirt. 02/03/2022: Pt. is unable to manipulate buttons on her sweater, or zippers on her jacket.  Time 12  Period Weeks  Status Achieved   OT LONG TERM GOAL  #9  TITLE Pt. will complete plant care with modified independence.  Baseline 09/20/2022: Pt. Continues to have difficulty reaching for thorough plant care. 09/13/22: continues to report intermittent difficulty 07/19/2022: Pt. Continues to be able to water, and care for some  of her plants. Pt. Has more difficulty with plants that are harder to reach. 07/05/2022: pt. Is able to water, and care for some  of her plants. Pt. Has more difficulty with plants that are harder to reach. 04/07/2022: Pt. is now able to hold a cup and water her plants.02/03/2022: Pt. has difficulty caring for her plants.   Time 12  Period Weeks  Status Ongoing   OT LONG TERM GOAL  #10  TITLE Pt. will demonstrate adaptive techniques to assist with the efficiency of self-dressing, or morning care tasks.  Baseline 09/20/2022: Pt. continues  to require assist form personal, and staff care aides. 09/13/22: MOD A dressing, reports she is often rushed 07/19/2022: Pt. continues to require assist with self-dressing/morning care tasks.   07/05/2022: Continue with goal.  04/07/2022: Pt. Continues to require assist with the efficiency of self-dressing, and morning care tasks.02/03/2022: Pt. requires assist from caregivers 2/2 time limitations during morning care.   Time 12  Period Weeks  Status Ongoing  OT LONG TERM GOAL  #11 Title Pt.  will improve BUE strength to improve ADL, and IADL functioning.  Baseline 09/20/2022: Pt. Presents with limited BUE strength 09/13/22: shoulder flexion L 4/5, R 3/5; elbow flexion B 5/5, elbow extension 4-/5; shoulder abduction L 4+/5, R 3+/5. 07/19/2022:  BUE strength continues to be limited. 07/05/2022: shoulder flexion: right 4-/5, abduction: 3+/5, elbow flexion: right: 5/5, left 5/5, extension: right: 4-/5, left 4-/5, wrist extension: right: 3-/5, left: 2-/5  Time 12   Period Weeks   Status Ongoing     Plan - 07/22/21 1737     Clinical Impression Pt. continues to tolerate manual therapy, there. Ex., and ROM well. Pt. Continues to present with tightness at the bilateral thumb webspace, and bilateral wrist flexor tightness, as well as digit MP, PIP, and DIP extensor tightness which continues to limit her ability to complete ADL tasks efficiently. Pt. continues to benefit from working on impoving ROM, and UE strength in order to work towards increasing bilateral hand grasp on objects, and increasing engagement of bilateral hands during ADLs, and IADL tasks.  Plan to take measurements, and do the progress report update next visit.      OT Occupational Profile and History Detailed Assessment- Review of Records and additional review of physical, cognitive, psychosocial history related to current functional performance    Occupational performance deficits (Please refer to evaluation for details): ADL's;IADL's;Leisure;Social  Participation    Body Structure / Function / Physical Skills ADL;Continence;Dexterity;Flexibility;Strength;ROM;Balance;Coordination;FMC;IADL;Endurance;UE functional use;Decreased knowledge of use of DME;[facility]    Psychosocial Skills Coping Strategies    Rehab Potential Fair    Clinical Decision Making Several treatment options, min-mod task modification necessary    Comorbidities Affecting Occupational Performance: Presence of comorbidities impacting occupational performance    Comorbidities impacting occupational performance description: contractures of bilateral hands, dependent transfers    Modification or Assistance to Complete Evaluation  No modification of tasks or assist necessary to complete eval    OT Frequency 2x / week    OT Duration 12 weeks    OT Treatment/Interventions Self-care/ADL training;Cryotherapy;Therapeutic exercise;DME and/or AE instruction;Balance training;Neuromuscular education;Manual Therapy;Splinting;Moist Heat;Passive range of motion;Therapeutic activities;Patient/family education    Plan continue to progress ROM of digits, wrists and shoulders as it pertains to completion of ADL tasks that pt values.    Consulted and Agree with Plan of Care Patient         Olegario Messier, MS, OTR/L  10/25/22, 3:12 PM

## 2022-10-25 NOTE — Therapy (Signed)
OUTPATIENT PHYSICAL THERAPY NEURO TREATMENT  Patient Name: Sherry [name] MRN: [id number] DOB:05/31/1934, 88 y.o., female Today's Date: 10/25/2022   PCP: Sherry Mealing, MD REFERRING PROVIDER: Deeann [name]   PT End of Session - 10/25/22 1152     Visit Number 72    Number of Visits 92    Date for PT Re-Evaluation 12/29/22    Progress Note Due on Visit 80    PT Start Time 1146    PT Stop Time 1225    PT Time Calculation (min) 39 min    Equipment Utilized During Treatment Gait belt    Activity Tolerance Patient tolerated treatment well;No increased pain;Patient limited by fatigue    Behavior During Therapy Sherry [name] for tasks assessed/performed               Past Medical History:  Diagnosis Date   Acute blood loss anemia    Arthritis    Cancer (HCC)    skin   Central cord syndrome at C6 level of cervical spinal cord (HCC) 11/29/2017   Hypertension    Protein-calorie malnutrition, severe (HCC) 01/24/2018   S/P insertion of IVC (inferior vena caval) filter 01/24/2018   Tetraparesis (HCC)    Past Surgical History:  Procedure Laterality Date   ANTERIOR CERVICAL DECOMP/DISCECTOMY FUSION N/A 11/29/2017   Procedure: Cervical five-six, six-seven Anterior Cervical Decompression Fusion;  Surgeon: Sherry Pierini, MD;  Location: [facility] OR;  Service: Neurosurgery;  Laterality: N/A;  Cervical five-six, six-seven Anterior Cervical Decompression Fusion   CATARACT EXTRACTION     FEMUR IM NAIL Left 08/16/2021   Procedure: INTRAMEDULLARY (IM) NAIL FEMORAL;  Surgeon: Sherry Saint, MD;  Location: [facility] ORS;  Service: Orthopedics;  Laterality: Left;   IR IVC FILTER PLMT / S&I /IMG GUID/MOD SED  12/08/2017   Patient Active Problem List   Diagnosis Date Noted   Age-related osteoporosis without current pathological fracture 07/27/2022   Mild recurrent major depression (HCC) 07/27/2022   Hypertensive heart disease with other congestive heart failure (HCC) 02/14/2022   Chronic indwelling  Foley catheter 02/14/2022   Closed hip fracture (HCC) 08/15/2021   DVT (deep venous thrombosis) (HCC) 08/15/2021   Quadriplegia (HCC) 08/15/2021   Fall 08/15/2021   Constipation due to slow transit 08/31/2018   Trauma 06/05/2018   Neuropathic pain 06/05/2018   Neurogenic bowel 06/05/2018   Vaginal yeast infection 01/30/2018   Healthcare-associated pneumonia 01/25/2018   Chronic allergic rhinitis 01/24/2018   Depression with anxiety 01/24/2018   UTI due to Klebsiella species 01/24/2018   Protein-calorie malnutrition, severe (HCC) 01/24/2018   S/P insertion of IVC (inferior vena caval) filter 01/24/2018   Tetraparesis (HCC) 01/20/2018   Neurogenic bladder 01/20/2018   Reactive depression    Benign essential HTN    Acute postoperative anemia due to expected blood loss    Central cord syndrome at C6 level of cervical spinal cord (HCC) 11/29/2017   Allergy to alpha-gal 11/25/2016   SCC (squamous cell carcinoma) 04/08/2014    ONSET DATE: 08/15/2021 (fall with B hip fx); Initial injury was in 2019- quadraplegia due to central cord syndrome of C6 secondary to neck fracture.   REFERRING DIAG: Bilateral Hip fx; Left Open- s/p Left IM nail femoral on 08/16/2021; Right closed  THERAPY DIAG:  Muscle weakness (generalized)  Other lack of coordination  Central cord syndrome, sequela (HCC)  Abnormality of gait and mobility  Difficulty in walking, not elsewhere classified  Closed fracture of both hips, initial encounter [name])  Rationale for Evaluation and Treatment Rehabilitation  SUBJECTIVE:   SUBJECTIVE STATEMENT:  Patient reports doing well overall today- no further pain in left thigh.    Pt accompanied by:  Paid caregiver-   PERTINENT HISTORY: Sherry [name] is an 87yoF who experienced a fall at home on 08/15/2021 with hip fracture, s/p Left hip ORIF. PMH: quadriplegia due to central cord syndrome of C6 secondary to neck fracture, HTN, depression with anxiety, neurogenic  bladder, bilateral DVT on Xarelto (s/p of IVC filter placement). Prior history of significant PT/OT with improvement of function. Pt has full use of shoulder/elbows, limited use of hands, adaptive self feeding sucessful.     PAIN:  None   OBJECTIVE:    TODAY'S TREATMENT:  10/25/22    Therex:  Seated knee ext  3sets of 10 reps each LE (AAROM on right LE on last set)    THERAPEUTIC ACTIVITIES:     Sit to stand in // bars- Max A +1 then another person (patient's paid caregiver)  to assist to keep gluteals engaged- pressing on bottom at times if patient lost her erect standing. She performed 4 trials of standing today  1) 1 min 11 sec 2) 2 min 7 sec 3) 2 min 2 sec 4) 1 min 44 sec.  Each trial focusing on anterior weight shift with hips to position over toes.      PATIENT EDUCATION: Education details: cues for isolated joint exercises  HOME EXERCISE PROGRAM: No changes at this time  Access Code: [facility] URL: [email or web address] Date: 11/23/2021 Prepared by: Sherry [name] Exercises - Seated Gluteal Sets - 2 x daily - 7 x weekly - 2 sets - 10 reps - 5 hold - Seated Quad Set - 2 x daily - 7 x weekly - 2 sets - 10 reps - 5 hold - Seated Long Arc Quad - 2 x daily - 7 x weekly - 2 sets - 10 reps - 5 hold - Seated March - 2 x daily - 7 x weekly - 2 sets - 10 reps - 5 hold - Seated Hip Abduction - 2 x daily - 7 x weekly - 2 sets - 10 reps - 5 hold - Seated Shoulder Shrugs - 2 x daily - 7 x weekly - 2 sets - 10 reps - 5 hold - Wheelchair Pressure Relief - 2 x daily - 7 x weekly - 2 sets - 10 reps - 5 hold  GOALS: Goals reviewed with patient? Yes  SHORT TERM GOALS: Target date: 02/22/2022  Pt will be independent with initial UE strengthening HEP in order to improve strength and balance in order to decrease fall risk and improve function at home and work. Baseline: 10/19/2021- patient with no formal UE HEP; 12/02/2021= Patient verbalized knowledge of HEP including use of theraband  for UE strengthening.  Goal status: GOAL MET  LONG TERM GOALS: Target date: 12/29/2022  Pt will be independent with final for UE/LE HEP in order to improve strength and balance in order to decrease fall risk and improve function at home and work. Baseline: Patient is currently BLE NWB and unable to participate in HEP. Has order for UE strengthening. 01/11/2022- Patient now able to participate in LE strengthening although NWB still- good understanding for some basic exercises. Will keep goal active to incorporate progressive LE strengthening exercises. 04/07/2022= Patient still participating in progressive LE seated HEP and has no questions at this time.  Goal status: MET  2.  Pt will improve FOTO to target score of 35  to display perceived improvements in  ability to complete ADL's.  Baseline: 10/19/2021= 12; 12/02/2021= 17; 01/11/2022=17; 04/07/2022= 17; 07/05/2022=17; 10/06/2022- outcome measure not appropriate as patient in non-ambulatory and not consistently standing. Goal status: Goal not appropriate  3.  Pt will increase strength of B UE  by at least 1/2 MMT grade in order to demonstrate improvement in strength and function  Baseline: patient has range of 2-/5 to 4/5 BUE Strength; 12/02/2021= 4/5 except with wrist ext. 01/11/2022= 4/5 B UE strength expept for wrist Goal status: Goal revised-no longer appropriate- working with OT on all UE strengthening.   4. Pt. Will increase strength of RLE by at least 1/2 MMT grade in order to demonstrate improvement in Standing/transfers.  Baseline: 2-/5 Right hip flex/knee ext/flex; 04/07/2022= 2- with right hip flex/knee ext (lacking 28 deg from zero) 4/8= Patient able to ext right knee lacking 18 deg from zero. 07/05/2022=Left knee approx 8 deg from zero and right knee = 23 deg from zero  Goal status: Progressing  5. Pt. Will demo ability to stand pivot transfer with max assist for improved functional mobility and less dependent need on mechanical device.    Baseline: Dependent on hoyer lift for all transfers; 04/07/2022- Unable to test secondary to patient with recent UTI and right hand procedure - will attempt to reassess next visit. ; 04/12/22: Unable to successfully stand due to feet plates on loaned power w/c; 05/10/2022- Patient still having to use rental chair and has not received her original power w/c back to practice SPT. 07/04/2021= Patient just received new power w/c and attempted standing from 1st time today- Patient able to stand with max assist today from w/c and did not attempt to perform pivot transfer- difficulty with static standing and placing weight on right LE. 10/06/2022- Not assessed today- patient needs new AFO prior to attempting transfers  Goal status: ONGOING  6. Pt. Will demonstrate improved functional LE strength as seen by ability to stand > 2 min for improved transfer ability and pregait abilities.   Baseline: not assessed today due to recent dx: UTI; 04/07/2022- Unable to test secondary to patient with recent UTI and right hand procedure - will attempt to reassess next visit; 04/12/22: Unable to successfully stand due to feet plates on loaned power w/c 05/10/2022- Patient still having to use rental chair and has not received her original power w/c back to practice Standing. 07/05/2022- Patient able to stand with max assist at trunk today for 2 min 3 sec. Will keep goal active to ensure consistency. 10/06/2022= Unable to assess today- patient with some recent blood pressure issues and needs new AFO.   Goal status: ONGOING  ASSESSMENT:  CLINICAL IMPRESSION: Patient continues to work on functional standing strength and endurance. Overall she is making progress with increased standing times and no knee buckling and decreased gluteal engagement assistance.   Pt will continue to benefit from skilled PT services to optimize independence and reduced caregiver support.    OBJECTIVE IMPAIRMENTS Abnormal gait, decreased activity tolerance, decreased  balance, decreased coordination, decreased endurance, decreased mobility, difficulty walking, decreased ROM, decreased strength, hypomobility, impaired flexibility, impaired UE functional use, postural dysfunction, and pain.   ACTIVITY LIMITATIONS carrying, lifting, bending, sitting, standing, squatting, sleeping, stairs, transfers, bed mobility, continence, bathing, toileting, dressing, self feeding, reach over head, hygiene/grooming, and caring for others  PARTICIPATION LIMITATIONS: meal prep, cleaning, laundry, medication management, personal finances, interpersonal relationship, driving, shopping, community activity, and yard work  PERSONAL FACTORS Age, Time since onset of injury/illness/exacerbation, and 1-2 comorbidities: HTN, cervical  Sx  are also affecting patient's functional outcome.   REHAB POTENTIAL: Good  CLINICAL DECISION MAKING: Evolving/moderate complexity  EVALUATION COMPLEXITY: Moderate  PLAN: PT FREQUENCY: 1-2x/week  PT DURATION: 12 weeks  PLANNED INTERVENTIONS: Therapeutic exercises, Therapeutic activity, Neuromuscular re-education, Balance training, Gait training, Patient/Family education, Self Care, Joint mobilization, DME instructions, Dry Needling, Electrical stimulation, Wheelchair mobility training, Spinal mobilization, Cryotherapy, Moist heat, and Manual therapy  PLAN FOR NEXT SESSION: strength, cortraining, motor control postural righting performance endurance   1:10 PM, 10/25/22 Louis Meckel, PT Physical Therapist - [name]  Outpatient Physical Therapy- [facility] [phone]

## 2022-10-27 ENCOUNTER — Ambulatory Visit: Payer: Medicare HMO

## 2022-10-27 ENCOUNTER — Ambulatory Visit: Payer: Medicare HMO | Admitting: Occupational Therapy

## 2022-10-27 DIAGNOSIS — R262 Difficulty in walking, not elsewhere classified: Secondary | ICD-10-CM

## 2022-10-27 DIAGNOSIS — M6281 Muscle weakness (generalized): Secondary | ICD-10-CM

## 2022-10-27 DIAGNOSIS — S72001A Fracture of unspecified part of neck of right femur, initial encounter for closed fracture: Secondary | ICD-10-CM

## 2022-10-27 DIAGNOSIS — R278 Other lack of coordination: Secondary | ICD-10-CM

## 2022-10-27 DIAGNOSIS — S14129S Central cord syndrome at unspecified level of cervical spinal cord, sequela: Secondary | ICD-10-CM

## 2022-10-27 DIAGNOSIS — R269 Unspecified abnormalities of gait and mobility: Secondary | ICD-10-CM

## 2022-10-27 NOTE — Addendum Note (Signed)
Addended by: Avon Gully on: 10/27/2022 03:56 PM   Modules accepted: Orders

## 2022-10-27 NOTE — Therapy (Addendum)
Occupational Therapy Progress/Recertification Note  Dates of reporting period  09/13/2022   to   10/27/2022    Patient Name: Sherry [name] MRN: [id number] DOB:09-Dec-1934, 88 y.o., female Today's Date: 09/20/2022                                 PCP: Dr. Tillman Abide REFERRING PROVIDER: Dr. Earnestine Mealing   OT End of Session - 10/27/22 1105     Visit Number [id number]    Number of Visits 96    Date for OT Re-Evaluation 01/19/2023   Authorization Time Period Progress report period starting 10/27/2022    OT Start Time 1100    OT Stop Time 1145    OT Time Calculation (min) 45 min    Equipment Utilized During Treatment powered wheelchair    Activity Tolerance Patient tolerated treatment well    Behavior During Therapy WFL for tasks assessed/performed               Past Medical History:  Diagnosis Date   Acute blood loss anemia    Arthritis    Cancer (HCC)    skin   Central cord syndrome at C6 level of cervical spinal cord (HCC) 11/29/2017   Hypertension    Protein-calorie malnutrition, severe (HCC) 01/24/2018   S/P insertion of IVC (inferior vena caval) filter 01/24/2018   Tetraparesis (HCC)    Past Surgical History:  Procedure Laterality Date   ANTERIOR CERVICAL DECOMP/DISCECTOMY FUSION N/A 11/29/2017   Procedure: Cervical five-six, six-seven Anterior Cervical Decompression Fusion;  Surgeon: Jadene Pierini, MD;  Location: [facility] OR;  Service: Neurosurgery;  Laterality: N/A;  Cervical five-six, six-seven Anterior Cervical Decompression Fusion   CATARACT EXTRACTION     FEMUR IM NAIL Left 08/16/2021   Procedure: INTRAMEDULLARY (IM) NAIL FEMORAL;  Surgeon: Deeann Saint, MD;  Location: [facility] ORS;  Service: Orthopedics;  Laterality: Left;   IR IVC FILTER PLMT / S&I /IMG GUID/MOD SED  12/08/2017   Patient Active Problem List   Diagnosis Date Noted   Age-related osteoporosis without current pathological fracture 07/27/2022   Mild recurrent major depression (HCC) 07/27/2022    Hypertensive heart disease with other congestive heart failure (HCC) 02/14/2022   Chronic indwelling Foley catheter 02/14/2022   Closed hip fracture (HCC) 08/15/2021   DVT (deep venous thrombosis) (HCC) 08/15/2021   Quadriplegia (HCC) 08/15/2021   Fall 08/15/2021   Constipation due to slow transit 08/31/2018   Trauma 06/05/2018   Neuropathic pain 06/05/2018   Neurogenic bowel 06/05/2018   Vaginal yeast infection 01/30/2018   Healthcare-associated pneumonia 01/25/2018   Chronic allergic rhinitis 01/24/2018   Depression with anxiety 01/24/2018   UTI due to Klebsiella species 01/24/2018   Protein-calorie malnutrition, severe (HCC) 01/24/2018   S/P insertion of IVC (inferior vena caval) filter 01/24/2018   Tetraparesis (HCC) 01/20/2018   Neurogenic bladder 01/20/2018   Reactive depression    Benign essential HTN    Acute postoperative anemia due to expected blood loss    Central cord syndrome at C6 level of cervical spinal cord (HCC) 11/29/2017   Allergy to alpha-gal 11/25/2016   SCC (squamous cell carcinoma) 04/08/2014    REFERRING DIAG: Central Cord Syndrome at C6 of the Cervical Spinal Cord, Fall with Bilateral Closed Hip Fractures, with ORIF repair with Intramedullary Nailing of the Left Hip Fracture.  THERAPY DIAG:   Muscle weakness (generalized)  Other lack of coordination  Rationale for Evaluation and Treatment Rehabilitation  PERTINENT HISTORY: Patient s/p fall November 29, 2017 resulting in diagnosis of central cord syndrome at C 6 level.  She has had therapy in multiple venues but with recent move to [facility], therapy staff recommended she seek outpatient therapy for her needs. Pt. Had a recent fall with Bilateral Closed Hip Fractures, with ORIF repair with Intramedullary Nailing of the Left Hip Fracture.   PRECAUTIONS: None  SUBJECTIVE:   Pt. Reports doing weill today PAIN: Are you having pain No  OBJECTIVE:    Today's Treatment:    Measurements were  obtained, and goals were reviewed, and updated with the Pt.      PATIENT EDUCATION: RUE Distal ROM, and strength Person educated: Patient Education method: Explanation and Demonstration Education comprehension: verbalized understanding, returned demonstration, and needs further education   HOME EXERCISE PROGRAM Continue with ongoing HEPs  Measurements:  12/09/2021:  Shoulder flexion: R: 120(136), L: 138(150) Shoulder abduction: R: 108(120), L: 110(120) Elbow: R: 0-148, L: 0-146 Wrist flexion: R: 55(62), L: 78 Writ extension: R: 34(55), L: 3(4) RD: R: 12(20), L: 16(26) UD: R: 8(24), L: 6(18) Thumb radial abduction: R: 11(20), L: 8(20) Digit flexion to the [facility]: R:  2nd: 5cm(3.5cm), 3rd: 7.5 cm(5cm), 4th: 7cm(3cm), 5th: 6cm(5.5cm) L: 2nd: 8cm(8cm), 3rd: 8cm(8cm), 4th: 7.5cm(7cm), 5th: 7cm(7cm)  02/03/2022:   Shoulder flexion: R: 122(138), L: 148(154) Shoulder abduction: R: 109(125), L: 125(125 Elbow: R: 0-148, L: 0-146 Wrist flexion: R: 60(68), L: 79 Writ extension: R: 40(55), L: 3(10) RD: R: 14(24), L: 20(28) UD: R: 8(24), L: 6(18) Thumb radial abduction: R: 20(25), L: 8(20) Digit flexion to the [facility]: R:  2nd: 4.5cm(3cm), 3rd: 6.5cm(4.5cm), 4th: 6.5 cm(3cm), 5th: 4.5cm(5cm) L: 2nd: 8cm(8cm), 3rd: 8cm(7cm), 4th: 7.5cm(7cm), 5th: 6.5cm(6.5cm)   04/07/2022:   Shoulder flexion: R: 125(150), L: 160(160) Shoulder abduction: R: 109(125), L: 125(125 Elbow: R: 0-148, L: 0-146 Wrist flexion: R: 60(68), L: 79 Writ extension: R: 40(55), L: 5(18) RD: R: 14(24), L: 20(28) UD: R: 18(24), L: 8(18) Thumb radial abduction: R: 20(25), L: 8(20) Digit flexion to the [facility]: R:  2nd: 4.5 cm(4 cm), 3rd: 6.5cm(3cm), 4th: 7 cm (5cm), 5th: 5cm(5cm) L: 2nd: 8cm(8cm), 3rd: 8cm(7cm), 4th: 7.5cm(7cm), 5th: 7cm(7cm)   07/05/2022:   Shoulder flexion: R: 125(154), L: 160(160) Shoulder abduction: R: 95(130), L: 143(143) Elbow: R: 0-148, L: 0-146 Wrist flexion: R: 60(68), L: 79 Writ extension: R:  40(60), L: 0(12) RD: R: 10(24), L: 12(20) UD: R: 16(24), L: 8(16) Thumb radial abduction: R: 20(25), L: 8(20) Digit flexion to the [facility]: R:  2nd: 4 cm(4cm), 3rd: 6.5cm( 3.5cm), 4th: 7 cm (5cm), 5th: 6cm(5cm) L: 2nd: 8cm(8cm), 3rd: 8cm(7cm), 4th: 8cm(7cm), 5th: 7cm(7cm)  09/13/2022:   Shoulder flexion: R: 125(154), L: 160(160) Shoulder abduction: R: 100(130), L: 143(143) Elbow: R: 0-150, L: 0-150 Wrist flexion: R: 55(68), L: 79 Writ extension: R: 45(60), L: 0(12) RD: R: 10(24), L: 12(20) UD: R: 16(24), L: 8(16) Thumb radial abduction: R: 20(25), L: 8(20) Digit flexion to the [facility]: R:  2nd: 4 cm(4cm), 3rd: 6cm( 5cm), 4th: 6 cm (4cm), 5th: 5cm(5cm) L: 2nd: 7cm(cm), 3rd: 7cm(6cm), 4th: 7cm(6cm), 5th: 6cm(6cm)  10/27/2022   Shoulder flexion: R: 128(154), L: 160(160) Shoulder abduction: R: 105(130), L: 143(143) Elbow: R: 0-150, L: 0-150 Wrist flexion: R: 55(68), L: 79 Writ extension: R: 46(64), L: 0(20) RD: R: 10(20), L: 18(24) UD: R: 118(28), L: 8(20) Thumb radial abduction: R: 30(42), L:20(24) Digit flexion to the [facility]: R:  2nd: 4 cm(3cm), 3rd: 6cm( 6cm), 4th: 6 cm (5cm),  5th: 5cm(5cm) L: 2nd: 8cm(8cm), 3rd: 8cm(6cm), 4th: 8cm(7cm), 5th: 6cm(6cm)       OT Long Term Goals - 07/06/21 1106                                                                                          Target Date: 01/19/2023        OT LONG TERM GOAL #1   Title    Baseline    Time    Period    Status      OT LONG TERM GOAL #2   Title    Baseline    Time    Period    Status                OT LONG TERM GOAL #3   Title Patient will demonstrate improved composite finger flexion to be able to firmly hold  and use adaptive devices during ADLs, and IADLs.   Baseline 10/27/2022: Pt. presents with increased MP, PIP, and DIP digit tightness/stiffness limiting the formulation of bilateral composites fists during this progress reporting period limiting. Digit flexion to the [facility]: R:  2nd: 4 cm(3cm), 3rd:  6cm( 6cm), 4th: 6 cm (5cm), 5th: 5cm(5cm), L: 2nd: 8cm(8cm), 3rd: 8cm(6cm), 4th: 8cm(7cm), 5th: 6cm(6cm) 09/20/2022: Improving digit flexion to the [facility] 09/13/22: improved digit flexion to the [facility]. 07/05/2022: Pt. presents with digit MP, PIP, and DIP extensor tightness limiting her ability to securely grip objects in her bilateral hands.  04/07/2022: Pt. Has improved with right 2nd, and 3rd digit flexion towards the [facility]. Pt. Continues to have difficulty securely holding and applying deodorant.02/03/2022: Pt. Has improved with digit flexion, however continues to have difficulty securely holding and using deoderant. 12/09/2021: Bilateral hand/digit MP, PIP, and DIP extension tightness limits her ability achieve digit flexion to hold, and apply deodorant. 10/28/2021:  Pt. continues to have difficulty holding the deodorant. 01/12/2021: Pt. Presents with limited digit extension. Pt. Is able to initiate holding deodorant, however is unable to hold it while using it.   Time 12    Period Weeks    Status Ongoing   Target Date                  OT LONG TERM GOAL #4   Title Pt. Will improve bilateral wrist extension in preparation for anticipating, and initiating reaching for objects at the table.    Baseline 10/27/2022: Pt. Is improving with bilateral wrist extension. R: 46(64) L: 0(20) 09/20/2022: limited PROM in the Left wrist extension 2/2 tightness/stiffness. 09/13/22: R: 55(68), L: 0(12) 07/19/2022: Bilateral wrist extension in limited. 07/05/2022: Right: 40(60) Left: 0(12) 04/07/2022: Rght: 40(55) Left: 5(18) 02/03/2022: Right: 34(55) Left: 3(4) 12/09/2021: Right  34(55)  Left  3(4) 10/28/2021:  Right 22(38), Left 0(15) 09/14/2021:  Right: 17(35), left 2(15)   Time 12   Period weeks   Status Improving/Ongoing   Target Date      OT LONG TERM GOAL #5   Title    Baseline    Time    Period    Status          OT LONG TERM GOAL  #6  TITLE    Baseline    Time    Period    Status        OT LONG TERM GOAL  #7   TITLE Pt. Will increase  bilateral lateral pinch strength by 2 lbs to be able to securely grasp items during ADLs, and IADL tasks.   Baseline 10/27/2023: Pt. Is improving with holding items with her bilateral thumbs. (Pinch meter out for calibration) 09/20/2022: lateral pinch continues to be limited 09/13/22: R 6.5# L 4# 07/19/2022: TBD 07/05/2022: TBD 07/2022: NT-Pinch meter out for calibration. 02/03/2022: Right: 6#, Left: 4# 12/09/2021:  Right: 6#, Left: 4#   Time 12  Period Weeks  Status Ongoing            OT LONG TERM GOAL  #8  TITLE   Baseline   Time   Period   Status    OT LONG TERM GOAL  #9  TITLE Pt. will complete plant care with modified independence.  Baseline 10/27/2022: Pt. Is able to water plants that are closer, and  within reach. Pt. continues to have difficulty reaching for thorough plant care. 09/20/2022: Pt. Continues to have difficulty reaching for thorough plant care. 09/13/22: continues to report intermittent difficulty 07/19/2022: Pt. Continues to be able to water, and care for some  of her plants. Pt. Has more difficulty with plants that are harder to reach. 07/05/2022: pt. Is able to water, and care for some  of her plants. Pt. Has more difficulty with plants that are harder to reach. 04/07/2022: Pt. is now able to hold a cup and water her plants.02/03/2022: Pt. has difficulty caring for her plants.   Time 12  Period Weeks  Status Ongoing   OT LONG TERM GOAL  #10  TITLE Pt. will demonstrate adaptive techniques to assist with the efficiency of self-dressing, or morning care tasks.  Baseline 10/27/2022: Pt. is able to assist with initiating UE dressing. Pt. requires assist from personal, and staff care aides. 09/20/2022: Pt. continues to require assist form personal, and staff care aides. 09/13/22: MOD A dressing, reports she is often rushed 07/19/2022: Pt. continues to require assist with self-dressing/morning care tasks.   07/05/2022: Continue with goal.  04/07/2022: Pt. Continues  to require assist with the efficiency of self-dressing, and morning care tasks.02/03/2022: Pt. requires assist from caregivers 2/2 time limitations during morning care.   Time 12  Period Weeks  Status Ongoing  OT LONG TERM GOAL  #11 Title Pt.  will improve BUE strength to improve ADL, and IADL functioning.  Baseline 10/27/2022: shoulder flexion L 4/5, R 3+/5;  shoulder abduction L 4+/5, R 3+/5. elbow flexion B 5/5, elbow extension 4/5 09/20/2022: Pt. Presents with limited BUE strength 09/13/22: shoulder flexion L 4/5, R 3/5; elbow flexion B 5/5, elbow extension 4-/5; shoulder abduction L 4+/5, R 3+/5. 07/19/2022:  BUE strength continues to be limited. 07/05/2022: shoulder flexion: right 4-/5, abduction: 3+/5, elbow flexion: right: 5/5, left 5/5, extension: right: 4-/5, left 4-/5, wrist extension: right: 3-/5, left: 2-/5  Time 12   Period Weeks   Status Ongoing  OT LONG TERM GOAL  #12 Title Pt.  will improve  bilateral thumb radial abduction in order to be able to hold the grab bars while standing with PT  Baseline 10/27/2022:  thumb radial abduction: R: 30(42), L: 20(24)  Time 12   Period Weeks   Status New  OT LONG TERM GOAL  #13 Title Pt.  Will be able to securely hold, and stabilize medication  bottles at a tabletop surface when opening, and closing them.  Baseline 10/27/2022:  Pt. Stabilizes bottles against her torso when attempting to open, and close them   Time 12   Period Weeks   Status New     Plan - 07/22/21 1737     Clinical Impression Pt. has made steady progress with ROM, and strength in the bilateral UEs. Pt. Is consistently engaging her bilateral UEs during more ADL, and IADL tasks. Pt. Has made excellent progress with bilateral thumb radial abduction. Pt. Continues to present with decreased strength, and limited ROM in the right shoulder and bilateral wrists. Pt. presents with tightness, and stiffness in the Bilateral MPs, PIPs, and DIPs hindering her ability perform digit flexion  needed to securely hold objects. Pt. continues to benefit from working on impoving ROM, and UE strength in order to work towards increasing bilateral hand grasp on objects, improving UE self-dressing tasks, stabilizing items while opening them, reaching to water her plants, holding grab bars while attempting to stand, and increasing engagement of bilateral hands during ADLs, and IADL tasks. Goals have been updated, and modified.      OT Occupational Profile and History Detailed Assessment- Review of Records and additional review of physical, cognitive, psychosocial history related to current functional performance    Occupational performance deficits (Please refer to evaluation for details): ADL's;IADL's;Leisure;Social Participation    Body Structure / Function / Physical Skills ADL;Continence;Dexterity;Flexibility;Strength;ROM;Balance;Coordination;[facility]MC;IADL;Endurance;UE functional use;Decreased knowledge of use of DME;[facility]    Psychosocial Skills Coping Strategies    Rehab Potential Fair    Clinical Decision Making Several treatment options, min-mod task modification necessary    Comorbidities Affecting Occupational Performance: Presence of comorbidities impacting occupational performance    Comorbidities impacting occupational performance description: contractures of bilateral hands, dependent transfers    Modification or Assistance to Complete Evaluation  No modification of tasks or assist necessary to complete eval    OT Frequency 2x / week    OT Duration 12 weeks    OT Treatment/Interventions Self-care/ADL training;Cryotherapy;Therapeutic exercise;DME and/or AE instruction;Balance training;Neuromuscular education;Manual Therapy;Splinting;Moist Heat;Passive range of motion;Therapeutic activities;Patient/family education    Plan continue to progress ROM of digits, wrists and shoulders as it pertains to completion of ADL tasks that pt values.    Consulted and Agree with Plan of Care Patient          Olegario Messier, MS, OTR/L  10/27/22, 12:37 PM

## 2022-10-27 NOTE — Therapy (Signed)
OUTPATIENT PHYSICAL THERAPY NEURO TREATMENT  Patient Name: Sherry [name] MRN: [id number] DOB:1934-07-07, 88 y.o., female Today's Date: 10/27/2022   PCP: Earnestine Mealing, MD REFERRING PROVIDER: Deeann Saint   PT End of Session - 10/27/22 1151     Visit Number 73    Number of Visits 92    Date for PT Re-Evaluation 12/29/22    Progress Note Due on Visit 80    PT Start Time 1146    Equipment Utilized During Treatment Gait belt    Activity Tolerance Patient tolerated treatment well;No increased pain;Patient limited by fatigue    Behavior During Therapy [facility] for tasks assessed/performed               Past Medical History:  Diagnosis Date   Acute blood loss anemia    Arthritis    Cancer (HCC)    skin   Central cord syndrome at C6 level of cervical spinal cord (HCC) 11/29/2017   Hypertension    Protein-calorie malnutrition, severe (HCC) 01/24/2018   S/P insertion of IVC (inferior vena caval) filter 01/24/2018   Tetraparesis (HCC)    Past Surgical History:  Procedure Laterality Date   ANTERIOR CERVICAL DECOMP/DISCECTOMY FUSION N/A 11/29/2017   Procedure: Cervical five-six, six-seven Anterior Cervical Decompression Fusion;  Surgeon: Jadene Pierini, MD;  Location: [facility] OR;  Service: Neurosurgery;  Laterality: N/A;  Cervical five-six, six-seven Anterior Cervical Decompression Fusion   CATARACT EXTRACTION     FEMUR IM NAIL Left 08/16/2021   Procedure: INTRAMEDULLARY (IM) NAIL FEMORAL;  Surgeon: Deeann Saint, MD;  Location: [facility] ORS;  Service: Orthopedics;  Laterality: Left;   IR IVC FILTER PLMT / S&I /IMG GUID/MOD SED  12/08/2017   Patient Active Problem List   Diagnosis Date Noted   Age-related osteoporosis without current pathological fracture 07/27/2022   Mild recurrent major depression (HCC) 07/27/2022   Hypertensive heart disease with other congestive heart failure (HCC) 02/14/2022   Chronic indwelling Foley catheter 02/14/2022   Closed hip fracture (HCC)  08/15/2021   DVT (deep venous thrombosis) (HCC) 08/15/2021   Quadriplegia (HCC) 08/15/2021   Fall 08/15/2021   Constipation due to slow transit 08/31/2018   Trauma 06/05/2018   Neuropathic pain 06/05/2018   Neurogenic bowel 06/05/2018   Vaginal yeast infection 01/30/2018   Healthcare-associated pneumonia 01/25/2018   Chronic allergic rhinitis 01/24/2018   Depression with anxiety 01/24/2018   UTI due to Klebsiella species 01/24/2018   Protein-calorie malnutrition, severe (HCC) 01/24/2018   S/P insertion of IVC (inferior vena caval) filter 01/24/2018   Tetraparesis (HCC) 01/20/2018   Neurogenic bladder 01/20/2018   Reactive depression    Benign essential HTN    Acute postoperative anemia due to expected blood loss    Central cord syndrome at C6 level of cervical spinal cord (HCC) 11/29/2017   Allergy to alpha-gal 11/25/2016   SCC (squamous cell carcinoma) 04/08/2014    ONSET DATE: 08/15/2021 (fall with B hip fx); Initial injury was in 2019- quadraplegia due to central cord syndrome of C6 secondary to neck fracture.   REFERRING DIAG: Bilateral Hip fx; Left Open- s/p Left IM nail femoral on 08/16/2021; Right closed  THERAPY DIAG:  Muscle weakness (generalized)  Other lack of coordination  Central cord syndrome, sequela (HCC)  Abnormality of gait and mobility  Difficulty in walking, not elsewhere classified  Closed fracture of both hips, initial encounter [facility])  Rationale for Evaluation and Treatment Rehabilitation  SUBJECTIVE:   SUBJECTIVE STATEMENT:  Patient reports doing well overall today- no further pain  in left thigh at the moment but reports her thigh feels "hard"    Pt accompanied by:  Paid caregiver-   PERTINENT HISTORY: Sherry [name] is an 87yoF who experienced a fall at home on 08/15/2021 with hip fracture, s/p Left hip ORIF. PMH: quadriplegia due to central cord syndrome of C6 secondary to neck fracture, HTN, depression with anxiety, neurogenic bladder,  bilateral DVT on Xarelto (s/p of IVC filter placement). Prior history of significant PT/OT with improvement of function. Pt has full use of shoulder/elbows, limited use of hands, adaptive self feeding sucessful.     PAIN:  None   OBJECTIVE:    TODAY'S TREATMENT:  10/27/22    Therex:  Seated Hip flex/knee flex for AROM and stretch to left thigh   Manual:  Rolling stick to left anterior thigh x 5 min- Light glide/roll     THERAPEUTIC ACTIVITIES:     Sit to stand in // bars- Max A +1 then another person (patient's paid caregiver)  She performed 4 trials of standing today  1) 38 sec 2) 34 (difficulty with left knee buckling 3) 48 sec. And 4) 1 min 40 sec   * more difficulty with sit to stand and some left knee buckling today.      PATIENT EDUCATION: Education details: cues for isolated joint exercises  HOME EXERCISE PROGRAM: No changes at this time  Access Code: [facility] URL: [email or web address] Date: 11/23/2021 Prepared by: Precious Bard Exercises - Seated Gluteal Sets - 2 x daily - 7 x weekly - 2 sets - 10 reps - 5 hold - Seated Quad Set - 2 x daily - 7 x weekly - 2 sets - 10 reps - 5 hold - Seated Long Arc Quad - 2 x daily - 7 x weekly - 2 sets - 10 reps - 5 hold - Seated March - 2 x daily - 7 x weekly - 2 sets - 10 reps - 5 hold - Seated Hip Abduction - 2 x daily - 7 x weekly - 2 sets - 10 reps - 5 hold - Seated Shoulder Shrugs - 2 x daily - 7 x weekly - 2 sets - 10 reps - 5 hold - Wheelchair Pressure Relief - 2 x daily - 7 x weekly - 2 sets - 10 reps - 5 hold  GOALS: Goals reviewed with patient? Yes  SHORT TERM GOALS: Target date: 02/22/2022  Pt will be independent with initial UE strengthening HEP in order to improve strength and balance in order to decrease fall risk and improve function at home and work. Baseline: 10/19/2021- patient with no formal UE HEP; 12/02/2021= Patient verbalized knowledge of HEP including use of theraband for UE strengthening.  Goal  status: GOAL MET  LONG TERM GOALS: Target date: 12/29/2022  Pt will be independent with final for UE/LE HEP in order to improve strength and balance in order to decrease fall risk and improve function at home and work. Baseline: Patient is currently BLE NWB and unable to participate in HEP. Has order for UE strengthening. 01/11/2022- Patient now able to participate in LE strengthening although NWB still- good understanding for some basic exercises. Will keep goal active to incorporate progressive LE strengthening exercises. 04/07/2022= Patient still participating in progressive LE seated HEP and has no questions at this time.  Goal status: MET  2.  Pt will improve FOTO to target score of 35  to display perceived improvements in ability to complete ADL's.  Baseline: 10/19/2021= 12; 12/02/2021= 17; 01/11/2022=17; 04/07/2022= 17;  07/05/2022=17; 10/06/2022- outcome measure not appropriate as patient in non-ambulatory and not consistently standing. Goal status: Goal not appropriate  3.  Pt will increase strength of B UE  by at least 1/2 MMT grade in order to demonstrate improvement in strength and function  Baseline: patient has range of 2-/5 to 4/5 BUE Strength; 12/02/2021= 4/5 except with wrist ext. 01/11/2022= 4/5 B UE strength expept for wrist Goal status: Goal revised-no longer appropriate- working with OT on all UE strengthening.   4. Pt. Will increase strength of RLE by at least 1/2 MMT grade in order to demonstrate improvement in Standing/transfers.  Baseline: 2-/5 Right hip flex/knee ext/flex; 04/07/2022= 2- with right hip flex/knee ext (lacking 28 deg from zero) 4/8= Patient able to ext right knee lacking 18 deg from zero. 07/05/2022=Left knee approx 8 deg from zero and right knee = 23 deg from zero  Goal status: Progressing  5. Pt. Will demo ability to stand pivot transfer with max assist for improved functional mobility and less dependent need on mechanical device.   Baseline: Dependent on hoyer lift  for all transfers; 04/07/2022- Unable to test secondary to patient with recent UTI and right hand procedure - will attempt to reassess next visit. ; 04/12/22: Unable to successfully stand due to feet plates on loaned power w/c; 05/10/2022- Patient still having to use rental chair and has not received her original power w/c back to practice SPT. 07/04/2021= Patient just received new power w/c and attempted standing from 1st time today- Patient able to stand with max assist today from w/c and did not attempt to perform pivot transfer- difficulty with static standing and placing weight on right LE. 10/06/2022- Not assessed today- patient needs new AFO prior to attempting transfers  Goal status: ONGOING  6. Pt. Will demonstrate improved functional LE strength as seen by ability to stand > 2 min for improved transfer ability and pregait abilities.   Baseline: not assessed today due to recent dx: UTI; 04/07/2022- Unable to test secondary to patient with recent UTI and right hand procedure - will attempt to reassess next visit; 04/12/22: Unable to successfully stand due to feet plates on loaned power w/c 05/10/2022- Patient still having to use rental chair and has not received her original power w/c back to practice Standing. 07/05/2022- Patient able to stand with max assist at trunk today for 2 min 3 sec. Will keep goal active to ensure consistency. 10/06/2022= Unable to assess today- patient with some recent blood pressure issues and needs new AFO.   Goal status: ONGOING  ASSESSMENT:  CLINICAL IMPRESSION: Emphasis continued on functional strengthening- more difficulty with sit to stand requiring more physical assistance and increased difficulty remaining standing. She remains motivated and did report any pain with standing just weaker than last visit with more difficulty actually performing sit to stand.   Pt will continue to benefit from skilled PT services to optimize independence and reduced caregiver support.    OBJECTIVE  IMPAIRMENTS Abnormal gait, decreased activity tolerance, decreased balance, decreased coordination, decreased endurance, decreased mobility, difficulty walking, decreased ROM, decreased strength, hypomobility, impaired flexibility, impaired UE functional use, postural dysfunction, and pain.   ACTIVITY LIMITATIONS carrying, lifting, bending, sitting, standing, squatting, sleeping, stairs, transfers, bed mobility, continence, bathing, toileting, dressing, self feeding, reach over head, hygiene/grooming, and caring for others  PARTICIPATION LIMITATIONS: meal prep, cleaning, laundry, medication management, personal finances, interpersonal relationship, driving, shopping, community activity, and yard work  PERSONAL FACTORS Age, Time since onset of injury/illness/exacerbation, and 1-2 comorbidities:  HTN, cervical Sx  are also affecting patient's functional outcome.   REHAB POTENTIAL: Good  CLINICAL DECISION MAKING: Evolving/moderate complexity  EVALUATION COMPLEXITY: Moderate  PLAN: PT FREQUENCY: 1-2x/week  PT DURATION: 12 weeks  PLANNED INTERVENTIONS: Therapeutic exercises, Therapeutic activity, Neuromuscular re-education, Balance training, Gait training, Patient/Family education, Self Care, Joint mobilization, DME instructions, Dry Needling, Electrical stimulation, Wheelchair mobility training, Spinal mobilization, Cryotherapy, Moist heat, and Manual therapy  PLAN FOR NEXT SESSION: strength, core training, motor control postural righting performance endurance   11:52 AM, 10/27/22 Louis Meckel, PT Physical Therapist - [facility]  Outpatient Physical Therapy- Main Campus [phone]

## 2022-10-29 ENCOUNTER — Non-Acute Institutional Stay (SKILLED_NURSING_FACILITY): Payer: Self-pay | Admitting: Adult Health

## 2022-10-29 ENCOUNTER — Encounter: Payer: Self-pay | Admitting: Adult Health

## 2022-10-29 DIAGNOSIS — M25562 Pain in left knee: Secondary | ICD-10-CM | POA: Diagnosis not present

## 2022-10-29 NOTE — Progress Notes (Unsigned)
Location:  Other [facility]) Nursing Home Room Number: [facility] [address]A Place of Service:  SNF ([phone]) Provider:  Kenard Gower, DNP, FNP-BC  Patient Care Team: Earnestine Mealing, MD as PCP - General (Family Medicine)  Extended Emergency Contact Information Primary Emergency Contact: Sherry [name] Mobile Phone: [phone] Relation: Daughter Secondary Emergency Contact: Sherry [name] Address: [address]          [place], [place] [address] [name] of [place] Home Phone: [phone] Mobile Phone: [phone] Relation: Spouse  Code Status:  DNR  Goals of care: Advanced Directive information    10/29/2022    4:35 PM  Advanced Directives  Does Patient Have a Medical Advance Directive? Yes  Type of Estate agent of [place];Out of facility DNR (pink MOST or yellow form);Living will  Does patient want to make changes to medical advance directive? No - Patient declined  Copy of Healthcare Power of Attorney in Chart? Yes - validated most recent copy scanned in chart (See row information)     Chief Complaint  Patient presents with   Acute Visit    left knee pain    HPI:  Pt is a 88 y.o. [name] seen today for medical management of chronic diseases.  ***   Past Medical History:  Diagnosis Date   Acute blood loss anemia    Arthritis    Cancer (HCC)    skin   Central cord syndrome at C6 level of cervical spinal cord (HCC) 11/29/2017   Hypertension    Protein-calorie malnutrition, severe (HCC) 01/24/2018   S/P insertion of IVC (inferior vena caval) filter 01/24/2018   Tetraparesis (HCC)    Past Surgical History:  Procedure Laterality Date   ANTERIOR CERVICAL DECOMP/DISCECTOMY FUSION N/A 11/29/2017   Procedure: Cervical five-six, six-seven Anterior Cervical Decompression Fusion;  Surgeon: Jadene Pierini, MD;  Location: [facility] OR;  Service: Neurosurgery;  Laterality: N/A;  Cervical five-six, six-seven Anterior Cervical Decompression  Fusion   CATARACT EXTRACTION     FEMUR IM NAIL Left 08/16/2021   Procedure: INTRAMEDULLARY (IM) NAIL FEMORAL;  Surgeon: Deeann Saint, MD;  Location: [facility] ORS;  Service: Orthopedics;  Laterality: Left;   IR IVC FILTER PLMT / S&I /IMG GUID/MOD SED  12/08/2017    Allergies  Allergen Reactions   Other Swelling   Lisinopril-Hydrochlorothiazide Swelling    Swelling of the tongue   Beef-Derived Products Swelling    Facial and lip swelling (due to tick bite)   Dairy Aid [Tilactase] Swelling   Lisinopril-Hydrochlorothiazide Swelling    Tongue swelling   Pork-Derived Products Swelling    Outpatient Encounter Medications as of 10/29/2022  Medication Sig   acetaminophen (TYLENOL) 500 MG tablet Take 500 mg by mouth every 12 (twelve) hours as needed for mild pain.   acetaminophen (TYLENOL) 650 MG CR tablet Take 650 mg by mouth every 8 (eight) hours as needed for pain.   acetic acid 0.25 % irrigation Use 30ml via irrigation twice daily to keep foley Cath patent.   alendronate (FOSAMAX) 70 MG tablet Take 70 mg by mouth every Saturday.   calcium carbonate (CALCIUM 600) 600 MG TABS tablet Take 600 mg by mouth at bedtime.   Capsaicin 0.1 % CREA Apply 1 application  topically every evening. (Apply to hands)   cetirizine (ZYRTEC) 5 MG tablet Take 5 mg by mouth 2 (two) times daily.   cholecalciferol (VITAMIN D3) 25 MCG (1000 UNIT) tablet Take 1,000 Units by mouth daily.   DULoxetine (CYMBALTA) 60 MG capsule Take 60  mg by mouth daily.   ferrous sulfate 325 (65 FE) MG tablet Take 325 mg by mouth daily with breakfast.   fluocinonide (LIDEX) 0.05 % external solution Apply 1 Application topically every 12 (twelve) hours as needed.   gabapentin (NEURONTIN) 100 MG capsule Take 1 capsule (100 mg total) by mouth daily.   hydroxypropyl methylcellulose / hypromellose (ISOPTO TEARS / GONIOVISC) 2.5 % ophthalmic solution Place 1 drop into both eyes every hour as needed for dry eyes.   Infant Care Products  [facility]) OINT Apply to buttocks/gluteal folds topically every shift.   ketoconazole (NIZORAL) 2 % shampoo Apply 1 Application topically 2 (two) times a week.   lactulose, encephalopathy, (CHRONULAC) 10 GM/15ML SOLN Take 30 mLs by mouth daily as needed (mild constipation).   magnesium citrate solution Take 60-120 mLs by mouth daily as needed (constipation).   methylcellulose oral powder Take 1 packet by mouth at bedtime.   mometasone (ELOCON) 0.1 % lotion Apply 1 Application topically every Saturday at 6 PM. (Apply to the ear canal for eczema)   Multiple Vitamin (MULTIVITAMIN WITH MINERALS) TABS tablet Take 1 tablet by mouth daily.   olopatadine (PATADAY) 0.1 % ophthalmic solution Place 1 drop into both eyes 2 (two) times daily.   polyethylene glycol (MIRALAX / GLYCOLAX) 17 g packet Take 17 g by mouth 2 (two) times daily.   rivaroxaban (XARELTO) 10 MG TABS tablet Take 10 mg by mouth daily.   saccharomyces boulardii (FLORASTOR) 250 MG capsule Take 250 mg by mouth daily.   Saline Spray 0.2 % SOLN Place 2 sprays into both nostrils as needed (nasal congestion).   simethicone (MYLICON) 80 MG chewable tablet Chew 80 mg by mouth every 6 (six) hours as needed for flatulence.   sodium phosphate (FLEET) 7-19 GM/118ML ENEM Place 1 enema rectally every other day as needed for severe constipation.   spironolactone (ALDACTONE) 25 MG tablet Take 0.5 tablets (12.5 mg total) by mouth daily.   tiZANidine (ZANAFLEX) 2 MG tablet Take 1 tablet (2 mg total) by mouth 2 (two) times daily.   triamcinolone cream (KENALOG) 0.1 % Apply to thigh and lower legs topically every 8 hours as needed. Apply to back of neck topically as needed.   Zinc Oxide (TRIPLE PASTE) 12.8 % ointment Apply 1 Application topically. Every shift.   No facility-administered encounter medications on file as of 10/29/2022.    Review of Systems ***    Immunization History  Administered Date(s) Administered   Influenza, High Dose Seasonal PF  11/17/2021   Influenza-Unspecified 11/14/2018, 11/20/2019, 11/17/2020, 11/17/2021   Moderna Covid-19 Fall Seasonal Vaccine 19yrs & older 05/11/2022   Moderna Covid-19 Vaccine Bivalent Booster 70yrs & up 06/30/2021, 12/11/2021   Moderna Sars-Covid-2 Vaccination 02/09/2019, 03/09/2019, 12/14/2019, 06/20/2020, 10/24/2020   PPD Test 05/16/2019   Tdap 11/29/2017   Zoster Recombinant(Shingrix) 09/02/2022   Pertinent  Health Maintenance Due  Topic Date Due   INFLUENZA VACCINE  09/02/2022   DEXA SCAN  Discontinued      08/19/2021   10:00 AM 08/19/2021    8:54 PM 08/20/2021    8:20 AM 03/29/2022   10:11 AM 08/31/2022    2:Sherry PM  Fall Risk  Falls in the past year?    0 0  Was there an injury with Fall?    0   Fall Risk Category Calculator    0   (RETIRED) Patient Fall Risk Level High fall risk High fall risk High fall risk    Patient at Risk for Falls  Due to    No Fall Risks      Vitals:   10/29/22 1630  BP: 117/77  Pulse: 80  Resp: 20  Temp: (!) 97.4 F (36.3 C)  SpO2: 95%  Weight: 176 lb 6.4 oz (80 kg)  Height: 5\' 7"  (1.702 m)   Body mass index is 27.63 kg/m.  Physical Exam     Labs reviewed: Recent Labs    04/21/22 1200 06/02/22 0000 09/29/22 0000  NA 133* 140 139  K 3.8 4.6 3.8  CL 100 102 100  CO2 30 31* 29*  GLUCOSE 108*  --   --   BUN 8 13 10   CREATININE 0.68 0.5 0.5  CALCIUM 8.0* 9.0 8.6*   Recent Labs    02/08/22 0000 09/29/22 0000  AST 18 20  ALT 14 14  ALKPHOS 65 58  ALBUMIN 3.7 3.6   Recent Labs    03/08/22 0000 04/21/22 1200 04/21/22 1731 04/26/22 0000 05/03/22 0000 05/10/22 0000 05/17/22 0000 09/30/22 0000  WBC 6.2 7.1  --  5.7  --   --   --  4.7  NEUTROABS 3,683.00  --   --  3,283.00  --   --   --  2,627.00  HGB 11.9* 8.5*   < > 8.3*   < > 10.0* 10.7* 13.2  HCT 35* 26.6*   < > 26*   < > 31* 33* 41  MCV  --  99.3  --   --   --   --   --   --   PLT 360 348  --  444*  --   --   --  280   < > = values in this interval not displayed.    Lab Results  Component Value Date   TSH 1.31 09/29/2022   No results found for: "HGBA1C" No results found for: "CHOL", "HDL", "LDLCALC", "LDLDIRECT", "TRIG", "CHOLHDL"  Significant Diagnostic Results in last 30 days:  No results found.  Assessment/Plan ***   Family/ staff Communication: Discussed plan of care with resident and charge nurse  Labs/tests ordered:     Kenard Gower, DNP, MSN, FNP-BC [facility] [phone] (Monday-Friday 8:00 a.m. - 5:00 p.m.) [phone] (after hours)

## 2022-11-01 ENCOUNTER — Ambulatory Visit: Payer: Medicare HMO

## 2022-11-01 ENCOUNTER — Encounter: Payer: Self-pay | Admitting: Occupational Therapy

## 2022-11-01 ENCOUNTER — Ambulatory Visit: Payer: Medicare HMO | Admitting: Occupational Therapy

## 2022-11-01 DIAGNOSIS — M6281 Muscle weakness (generalized): Secondary | ICD-10-CM

## 2022-11-01 DIAGNOSIS — R278 Other lack of coordination: Secondary | ICD-10-CM

## 2022-11-01 DIAGNOSIS — S72001A Fracture of unspecified part of neck of right femur, initial encounter for closed fracture: Secondary | ICD-10-CM

## 2022-11-01 DIAGNOSIS — R2681 Unsteadiness on feet: Secondary | ICD-10-CM

## 2022-11-01 DIAGNOSIS — R269 Unspecified abnormalities of gait and mobility: Secondary | ICD-10-CM

## 2022-11-01 DIAGNOSIS — S14129S Central cord syndrome at unspecified level of cervical spinal cord, sequela: Secondary | ICD-10-CM

## 2022-11-01 DIAGNOSIS — R262 Difficulty in walking, not elsewhere classified: Secondary | ICD-10-CM

## 2022-11-01 NOTE — Therapy (Signed)
OUTPATIENT PHYSICAL THERAPY NEURO TREATMENT  Patient Name: Sherry [name] MRN: [id number] DOB:May 23, 1934, 88 y.o., female Today's Date: 11/01/2022   PCP: Sherry Mealing, MD REFERRING PROVIDER: Deeann [name]   PT End of Session - 11/01/22 1150     Visit Number 74    Number of Visits 92    Date for PT Re-Evaluation 12/29/22    Progress Note Due on Visit 80    PT Start Time 1145    PT Stop Time 1224    PT Time Calculation (min) 39 min    Equipment Utilized During Treatment Gait belt    Activity Tolerance Patient tolerated treatment well;No increased pain;Patient limited by fatigue    Behavior During Therapy Sherry [name] for tasks assessed/performed                Past Medical History:  Diagnosis Date   Acute blood loss anemia    Arthritis    Cancer (HCC)    skin   Central cord syndrome at C6 level of cervical spinal cord (HCC) 11/29/2017   Hypertension    Protein-calorie malnutrition, severe (HCC) 01/24/2018   S/P insertion of IVC (inferior vena caval) filter 01/24/2018   Tetraparesis (HCC)    Past Surgical History:  Procedure Laterality Date   ANTERIOR CERVICAL DECOMP/DISCECTOMY FUSION N/A 11/29/2017   Procedure: Cervical five-six, six-seven Anterior Cervical Decompression Fusion;  Surgeon: Sherry Pierini, MD;  Location: [facility] OR;  Service: Neurosurgery;  Laterality: N/A;  Cervical five-six, six-seven Anterior Cervical Decompression Fusion   CATARACT EXTRACTION     FEMUR IM NAIL Left 08/16/2021   Procedure: INTRAMEDULLARY (IM) NAIL FEMORAL;  Surgeon: Sherry Saint, MD;  Location: [facility] ORS;  Service: Orthopedics;  Laterality: Left;   IR IVC FILTER PLMT / S&I /IMG GUID/MOD SED  12/08/2017   Patient Active Problem List   Diagnosis Date Noted   Age-related osteoporosis without current pathological fracture 07/27/2022   Mild recurrent major depression (HCC) 07/27/2022   Hypertensive heart disease with other congestive heart failure (HCC) 02/14/2022   Chronic indwelling  Foley catheter 02/14/2022   Closed hip fracture (HCC) 08/15/2021   DVT (deep venous thrombosis) (HCC) 08/15/2021   Quadriplegia (HCC) 08/15/2021   Fall 08/15/2021   Constipation due to slow transit 08/31/2018   Trauma 06/05/2018   Neuropathic pain 06/05/2018   Neurogenic bowel 06/05/2018   Vaginal yeast infection 01/30/2018   Healthcare-associated pneumonia 01/25/2018   Chronic allergic rhinitis 01/24/2018   Depression with anxiety 01/24/2018   UTI due to Klebsiella species 01/24/2018   Protein-calorie malnutrition, severe (HCC) 01/24/2018   S/P insertion of IVC (inferior vena caval) filter 01/24/2018   Tetraparesis (HCC) 01/20/2018   Neurogenic bladder 01/20/2018   Reactive depression    Benign essential HTN    Acute postoperative anemia due to expected blood loss    Central cord syndrome at C6 level of cervical spinal cord (HCC) 11/29/2017   Allergy to alpha-gal 11/25/2016   SCC (squamous cell carcinoma) 04/08/2014    ONSET DATE: 08/15/2021 (fall with B hip fx); Initial injury was in 2019- quadraplegia due to central cord syndrome of C6 secondary to neck fracture.   REFERRING DIAG: Bilateral Hip fx; Left Open- s/p Left IM nail femoral on 08/16/2021; Right closed  THERAPY DIAG:  Muscle weakness (generalized)  Other lack of coordination  Central cord syndrome, sequela (HCC)  Abnormality of gait and mobility  Difficulty in walking, not elsewhere classified  Closed fracture of both hips, initial encounter (HCC)  Unsteadiness on feet  Rationale  for Evaluation and Treatment Rehabilitation  SUBJECTIVE:   SUBJECTIVE STATEMENT:  Patient reports she had her left femur x-rayed due to pain and no fracture.     Pt accompanied by:  Sherry caregiver-   PERTINENT HISTORY: Sherry [name] is an 87yoF who experienced a fall at home on 08/15/2021 with hip fracture, s/p Left hip ORIF. PMH: quadriplegia due to central cord syndrome of C6 secondary to neck fracture, HTN, depression  with anxiety, neurogenic bladder, bilateral DVT on Xarelto (s/p of IVC filter placement). Prior history of significant PT/OT with improvement of function. Pt has full use of shoulder/elbows, limited use of hands, adaptive self feeding sucessful.     PAIN:  None current but does endorse some left thigh pain intermittent  OBJECTIVE:    TODAY'S TREATMENT:  11/01/22    Therex:   AAROM to left knee with PT assist to improve her ability to flex knee to stand x several min Sit to stand in // bars- Max A +1 then another person (patient's Sherry caregiver)  She performed 3 trials of standing today  1) 2 min 7 sec 2) 2 min 2 sec and 3) stand and then readjusted to sit to seat of wheelchair. Patient did report some left knee soreness at end of session.     PATIENT EDUCATION: Education details: cues for isolated joint exercises  HOME EXERCISE PROGRAM: No changes at this time  Access Code: [name] URL: [email or web address] Date: 11/23/2021 Prepared by: Sherry [name] Exercises - Seated Gluteal Sets - 2 x daily - 7 x weekly - 2 sets - 10 reps - 5 hold - Seated Quad Set - 2 x daily - 7 x weekly - 2 sets - 10 reps - 5 hold - Seated Long Arc Quad - 2 x daily - 7 x weekly - 2 sets - 10 reps - 5 hold - Seated March - 2 x daily - 7 x weekly - 2 sets - 10 reps - 5 hold - Seated Hip Abduction - 2 x daily - 7 x weekly - 2 sets - 10 reps - 5 hold - Seated Shoulder Shrugs - 2 x daily - 7 x weekly - 2 sets - 10 reps - 5 hold - Wheelchair Pressure Relief - 2 x daily - 7 x weekly - 2 sets - 10 reps - 5 hold  GOALS: Goals reviewed with patient? Yes  SHORT TERM GOALS: Target date: 02/22/2022  Pt will be independent with initial UE strengthening HEP in order to improve strength and balance in order to decrease fall risk and improve function at home and work. Baseline: 10/19/2021- patient with no formal UE HEP; 12/02/2021= Patient verbalized knowledge of HEP including use of theraband for UE strengthening.   Goal status: GOAL MET  LONG TERM GOALS: Target date: 12/29/2022  Pt will be independent with final for UE/LE HEP in order to improve strength and balance in order to decrease fall risk and improve function at home and work. Baseline: Patient is currently BLE NWB and unable to participate in HEP. Has order for UE strengthening. 01/11/2022- Patient now able to participate in LE strengthening although NWB still- good understanding for some basic exercises. Will keep goal active to incorporate progressive LE strengthening exercises. 04/07/2022= Patient still participating in progressive LE seated HEP and has no questions at this time.  Goal status: MET  2.  Pt will improve FOTO to target score of 35  to display perceived improvements in ability to complete ADL's.  Baseline: 10/19/2021= 12;  12/02/2021= 17; 01/11/2022=17; 04/07/2022= 17; 07/05/2022=17; 10/06/2022- outcome measure not appropriate as patient in non-ambulatory and not consistently standing. Goal status: Goal not appropriate  3.  Pt will increase strength of B UE  by at least 1/2 MMT grade in order to demonstrate improvement in strength and function  Baseline: patient has range of 2-/5 to 4/5 BUE Strength; 12/02/2021= 4/5 except with wrist ext. 01/11/2022= 4/5 B UE strength expept for wrist Goal status: Goal revised-no longer appropriate- working with OT on all UE strengthening.   4. Pt. Will increase strength of RLE by at least 1/2 MMT grade in order to demonstrate improvement in Standing/transfers.  Baseline: 2-/5 Right hip flex/knee ext/flex; 04/07/2022= 2- with right hip flex/knee ext (lacking 28 deg from zero) 4/8= Patient able to ext right knee lacking 18 deg from zero. 07/05/2022=Left knee approx 8 deg from zero and right knee = 23 deg from zero  Goal status: Progressing  5. Pt. Will demo ability to stand pivot transfer with max assist for improved functional mobility and less dependent need on mechanical device.   Baseline: Dependent on hoyer  lift for all transfers; 04/07/2022- Unable to test secondary to patient with recent UTI and right hand procedure - will attempt to reassess next visit. ; 04/12/22: Unable to successfully stand due to feet plates on loaned power w/c; 05/10/2022- Patient still having to use rental chair and has not received her original power w/c back to practice SPT. 07/04/2021= Patient just received new power w/c and attempted standing from 1st time today- Patient able to stand with max assist today from w/c and did not attempt to perform pivot transfer- difficulty with static standing and placing weight on right LE. 10/06/2022- Not assessed today- patient needs new AFO prior to attempting transfers  Goal status: ONGOING  6. Pt. Will demonstrate improved functional LE strength as seen by ability to stand > 2 min for improved transfer ability and pregait abilities.   Baseline: not assessed today due to recent dx: UTI; 04/07/2022- Unable to test secondary to patient with recent UTI and right hand procedure - will attempt to reassess next visit; 04/12/22: Unable to successfully stand due to feet plates on loaned power w/c 05/10/2022- Patient still having to use rental chair and has not received her original power w/c back to practice Standing. 07/05/2022- Patient able to stand with max assist at trunk today for 2 min 3 sec. Will keep goal active to ensure consistency. 10/06/2022= Unable to assess today- patient with some recent blood pressure issues and needs new AFO.   Goal status: ONGOING  ASSESSMENT:  CLINICAL IMPRESSION: Patient presented with good motivation for today's session. She was responsive to VC with standing and able to stand with max Assist yet less overall physical assist than last week. She demo overall improved standing endurance but still very fatigue limited- provided adequate rest between stands today. Not as much left knee valgus with 2nd stand. Pt will continue to benefit from skilled PT services to optimize independence  and reduced caregiver support.    OBJECTIVE IMPAIRMENTS Abnormal gait, decreased activity tolerance, decreased balance, decreased coordination, decreased endurance, decreased mobility, difficulty walking, decreased ROM, decreased strength, hypomobility, impaired flexibility, impaired UE functional use, postural dysfunction, and pain.   ACTIVITY LIMITATIONS carrying, lifting, bending, sitting, standing, squatting, sleeping, stairs, transfers, bed mobility, continence, bathing, toileting, dressing, self feeding, reach over head, hygiene/grooming, and caring for others  PARTICIPATION LIMITATIONS: meal prep, cleaning, laundry, medication management, personal finances, interpersonal relationship, driving, shopping, community  activity, and yard work  PERSONAL FACTORS Age, Time since onset of injury/illness/exacerbation, and 1-2 comorbidities: HTN, cervical Sx  are also affecting patient's functional outcome.   REHAB POTENTIAL: Good  CLINICAL DECISION MAKING: Evolving/moderate complexity  EVALUATION COMPLEXITY: Moderate  PLAN: PT FREQUENCY: 1-2x/week  PT DURATION: 12 weeks  PLANNED INTERVENTIONS: Therapeutic exercises, Therapeutic activity, Neuromuscular re-education, Balance training, Gait training, Patient/Family education, Self Care, Joint mobilization, DME instructions, Dry Needling, Electrical stimulation, Wheelchair mobility training, Spinal mobilization, Cryotherapy, Moist heat, and Manual therapy  PLAN FOR NEXT SESSION: strength, core training, motor control postural righting performance endurance   12:26 PM, 11/01/22 Louis Meckel, PT Physical Therapist - [place] [name] [phone]

## 2022-11-01 NOTE — Therapy (Addendum)
Occupational Therapy Treatment Note   Patient Name: ADRAYA HOWTON MRN: [id number] DOB:[birth year], 88 y.o., female Today's Date: 09/20/2022                                 PCP: Dr. Tillman Abide REFERRING PROVIDER: Dr. Earnestine Mealing   OT End of Session - 10/27/22 1105     Visit Number [id number]   Number of Visits 96    Date for OT Re-Evaluation 01/19/2023   Authorization Time Period Progress report period starting 10/27/2022    OT Start Time 1100    OT Stop Time 1145    OT Time Calculation (min) 45 min    Equipment Utilized During Treatment powered wheelchair    Activity Tolerance Patient tolerated treatment well    Behavior During Therapy WFL for tasks assessed/performed               Past Medical History:  Diagnosis Date   Acute blood loss anemia    Arthritis    Cancer (HCC)    skin   Central cord syndrome at C6 level of cervical spinal cord (HCC) 11/29/2017   Hypertension    Protein-calorie malnutrition, severe (HCC) 01/24/2018   S/P insertion of IVC (inferior vena caval) filter 01/24/2018   Tetraparesis (HCC)    Past Surgical History:  Procedure Laterality Date   ANTERIOR CERVICAL DECOMP/DISCECTOMY FUSION N/A 11/29/2017   Procedure: Cervical five-six, six-seven Anterior Cervical Decompression Fusion;  Surgeon: Jadene Pierini, MD;  Location: [facility] OR;  Service: Neurosurgery;  Laterality: N/A;  Cervical five-six, six-seven Anterior Cervical Decompression Fusion   CATARACT EXTRACTION     FEMUR IM NAIL Left 08/16/2021   Procedure: INTRAMEDULLARY (IM) NAIL FEMORAL;  Surgeon: Deeann Saint, MD;  Location: [facility] ORS;  Service: Orthopedics;  Laterality: Left;   IR IVC FILTER PLMT / S&I /IMG GUID/MOD SED  12/08/2017   Patient Active Problem List   Diagnosis Date Noted   Age-related osteoporosis without current pathological fracture 07/27/2022   Mild recurrent major depression (HCC) 07/27/2022   Hypertensive heart disease with other congestive heart failure (HCC)  02/14/2022   Chronic indwelling Foley catheter 02/14/2022   Closed hip fracture (HCC) 08/15/2021   DVT (deep venous thrombosis) (HCC) 08/15/2021   Quadriplegia (HCC) 08/15/2021   Fall 08/15/2021   Constipation due to slow transit 08/31/2018   Trauma 06/05/2018   Neuropathic pain 06/05/2018   Neurogenic bowel 06/05/2018   Vaginal yeast infection 01/30/2018   Healthcare-associated pneumonia 01/25/2018   Chronic allergic rhinitis 01/24/2018   Depression with anxiety 01/24/2018   UTI due to Klebsiella species 01/24/2018   Protein-calorie malnutrition, severe (HCC) 01/24/2018   S/P insertion of IVC (inferior vena caval) filter 01/24/2018   Tetraparesis (HCC) 01/20/2018   Neurogenic bladder 01/20/2018   Reactive depression    Benign essential HTN    Acute postoperative anemia due to expected blood loss    Central cord syndrome at C6 level of cervical spinal cord (HCC) 11/29/2017   Allergy to alpha-gal 11/25/2016   SCC (squamous cell carcinoma) 04/08/2014    REFERRING DIAG: Central Cord Syndrome at C6 of the Cervical Spinal Cord, Fall with Bilateral Closed Hip Fractures, with ORIF repair with Intramedullary Nailing of the Left Hip Fracture.  THERAPY DIAG:   Muscle weakness (generalized)  Other lack of coordination  Rationale for Evaluation and Treatment Rehabilitation  PERTINENT HISTORY: Patient s/p fall November 29, 2017 resulting in diagnosis of central cord  syndrome at C 6 level.  She has had therapy in multiple venues but with recent move to [facility], therapy staff recommended she seek outpatient therapy for her needs. Pt. Had a recent fall with Bilateral Closed Hip Fractures, with ORIF repair with Intramedullary Nailing of the Left Hip Fracture.   PRECAUTIONS: None  SUBJECTIVE:   Pt. Reports doing well today PAIN: Are you having pain No  OBJECTIVE:    Today's Treatment:    Self Care:  Pt opened 1.5" and 2" pill bottles with screw on lids to simulate home pill  bottles; completed with increased time, no physical assist. Reports no difficulty completing at home.    Therapeutic Activity:  Pt worked on reaching for [employer], and moving them through 3 levels of horizontal rungs placed at progressively higher heights. Moved all rings up with R hand,  back down with L hand, then repeated up and down with B hands together.    Therapeutic Exercise:  Pt worked on [employer] using the UBE for reciprocal motion, therapist present to adjust settings and technique. Tolerated 8 min in sitting. Constant attendance was provided for hand position and cues to avoid compensation proximally. AAROM for elbow flexion and extension, wrist flexion/extension, radial deviation, and composite digit abb/adduction. Pt. requires rest breaks and verbal cues for proper technique.        PATIENT EDUCATION: RUE Distal ROM, and strength Person educated: Patient Education method: Explanation and Demonstration Education comprehension: verbalized understanding, returned demonstration, and needs further education   HOME EXERCISE PROGRAM Continue with ongoing HEPs  Measurements:  12/09/2021:  Shoulder flexion: R: 120(136), L: 138(150) Shoulder abduction: R: 108(120), L: 110(120) Elbow: R: 0-148, L: 0-146 Wrist flexion: R: 55(62), L: 78 Writ extension: R: 34(55), L: 3(4) RD: R: 12(20), L: 16(26) UD: R: 8(24), L: 6(18) Thumb radial abduction: R: 11(20), L: 8(20) Digit flexion to the [facility]: R:  2nd: 5cm(3.5cm), 3rd: 7.5 cm(5cm), 4th: 7cm(3cm), 5th: 6cm(5.5cm) L: 2nd: 8cm(8cm), 3rd: 8cm(8cm), 4th: 7.5cm(7cm), 5th: 7cm(7cm)  02/03/2022:   Shoulder flexion: R: 122(138), L: 148(154) Shoulder abduction: R: 109(125), L: 125(125 Elbow: R: 0-148, L: 0-146 Wrist flexion: R: 60(68), L: 79 Writ extension: R: 40(55), L: 3(10) RD: R: 14(24), L: 20(28) UD: R: 8(24), L: 6(18) Thumb radial abduction: R: 20(25), L: 8(20) Digit flexion to the [facility]: R:  2nd: 4.5cm(3cm), 3rd:  6.5cm(4.5cm), 4th: 6.5 cm(3cm), 5th: 4.5cm(5cm) L: 2nd: 8cm(8cm), 3rd: 8cm(7cm), 4th: 7.5cm(7cm), 5th: 6.5cm(6.5cm)   04/07/2022:   Shoulder flexion: R: 125(150), L: 160(160) Shoulder abduction: R: 109(125), L: 125(125 Elbow: R: 0-148, L: 0-146 Wrist flexion: R: 60(68), L: 79 Writ extension: R: 40(55), L: 5(18) RD: R: 14(24), L: 20(28) UD: R: 18(24), L: 8(18) Thumb radial abduction: R: 20(25), L: 8(20) Digit flexion to the [facility]: R:  2nd: 4.5 cm(4 cm), 3rd: 6.5cm(3cm), 4th: 7 cm (5cm), 5th: 5cm(5cm) L: 2nd: 8cm(8cm), 3rd: 8cm(7cm), 4th: 7.5cm(7cm), 5th: 7cm(7cm)   07/05/2022:   Shoulder flexion: R: 125(154), L: 160(160) Shoulder abduction: R: 95(130), L: 143(143) Elbow: R: 0-148, L: 0-146 Wrist flexion: R: 60(68), L: 79 Writ extension: R: 40(60), L: 0(12) RD: R: 10(24), L: 12(20) UD: R: 16(24), L: 8(16) Thumb radial abduction: R: 20(25), L: 8(20) Digit flexion to the [facility]: R:  2nd: 4 cm(4cm), 3rd: 6.5cm( 3.5cm), 4th: 7 cm (5cm), 5th: 6cm(5cm) L: 2nd: 8cm(8cm), 3rd: 8cm(7cm), 4th: 8cm(7cm), 5th: 7cm(7cm)  09/13/2022:   Shoulder flexion: R: 125(154), L: 160(160) Shoulder abduction: R: 100(130), L: 143(143) Elbow: R: 0-150, L: 0-150  Wrist flexion: R: 55(68), L: 79 Writ extension: R: 45(60), L: 0(12) RD: R: 10(24), L: 12(20) UD: R: 16(24), L: 8(16) Thumb radial abduction: R: 20(25), L: 8(20) Digit flexion to the [facility]: R:  2nd: 4 cm(4cm), 3rd: 6cm( 5cm), 4th: 6 cm (4cm), 5th: 5cm(5cm) L: 2nd: 7cm(cm), 3rd: 7cm(6cm), 4th: 7cm(6cm), 5th: 6cm(6cm)  10/27/2022   Shoulder flexion: R: 128(154), L: 160(160) Shoulder abduction: R: 105(130), L: 143(143) Elbow: R: 0-150, L: 0-150 Wrist flexion: R: 55(68), L: 79 Writ extension: R: 46(64), L: 0(20) RD: R: 10(20), L: 18(24) UD: R: 118(28), L: 8(20) Thumb radial abduction: R: 30(42), L:20(24) Digit flexion to the [facility]: R:  2nd: 4 cm(3cm), 3rd: 6cm( 6cm), 4th: 6 cm (5cm), 5th: 5cm(5cm) L: 2nd: 8cm(8cm), 3rd: 8cm(6cm), 4th: 8cm(7cm), 5th:  6cm(6cm)       OT Long Term Goals - 07/06/21 1106                                                                                          Target Date: 01/19/2023        OT LONG TERM GOAL #1   Title    Baseline    Time    Period    Status      OT LONG TERM GOAL #2   Title    Baseline    Time    Period    Status                OT LONG TERM GOAL #3   Title Patient will demonstrate improved composite finger flexion to be able to firmly hold  and use adaptive devices during ADLs, and IADLs.   Baseline 10/27/2022: Pt. presents with increased MP, PIP, and DIP digit tightness/stiffness limiting the formulation of bilateral composites fists during this progress reporting period limiting. Digit flexion to the [facility]: R:  2nd: 4 cm(3cm), 3rd: 6cm( 6cm), 4th: 6 cm (5cm), 5th: 5cm(5cm), L: 2nd: 8cm(8cm), 3rd: 8cm(6cm), 4th: 8cm(7cm), 5th: 6cm(6cm) 09/20/2022: Improving digit flexion to the [facility]. 09/13/22: improved digit flexion to the [facility]. 07/05/2022: Pt. presents with digit MP, PIP, and DIP extensor tightness limiting her ability to securely grip objects in her bilateral hands.  04/07/2022: Pt. Has improved with right 2nd, and 3rd digit flexion towards the [facility]. Pt. Continues to have difficulty securely holding and applying deodorant.02/03/2022: Pt. Has improved with digit flexion, however continues to have difficulty securely holding and using deoderant. 12/09/2021: Bilateral hand/digit MP, PIP, and DIP extension tightness limits her ability achieve digit flexion to hold, and apply deodorant. 10/28/2021:  Pt. continues to have difficulty holding the deodorant. 01/12/2021: Pt. Presents with limited digit extension. Pt. Is able to initiate holding deodorant, however is unable to hold it while using it.   Time 12    Period Weeks    Status Ongoing   Target Date                  OT LONG TERM GOAL #4   Title Pt. Will improve bilateral wrist extension in preparation for anticipating, and initiating  reaching for objects at the table.    Baseline 10/27/2022: Pt. Is improving  with bilateral wrist extension. R: 46(64) L: 0(20) 09/20/2022: limited PROM in the Left wrist extension 2/2 tightness/stiffness. 09/13/22: R: 55(68), L: 0(12) 07/19/2022: Bilateral wrist extension in limited. 07/05/2022: Right: 40(60) Left: 0(12) 04/07/2022: Rght: 40(55) Left: 5(18) 02/03/2022: Right: 34(55) Left: 3(4) 12/09/2021: Right  34(55)  Left  3(4) 10/28/2021:  Right 22(38), Left 0(15) 09/14/2021:  Right: 17(35), left 2(15)   Time 12   Period weeks   Status Improving/Ongoing   Target Date      OT LONG TERM GOAL #5   Title    Baseline    Time    Period    Status          OT LONG TERM GOAL  #6   TITLE    Baseline    Time    Period    Status        OT LONG TERM GOAL  #7  TITLE Pt. Will increase  bilateral lateral pinch strength by 2 lbs to be able to securely grasp items during ADLs, and IADL tasks.   Baseline 10/27/2023: Pt. Is improving with holding items with her bilateral thumbs. (Pinch meter out for calibration) 09/20/2022: lateral pinch continues to be limited 09/13/22: R 6.5# L 4# 07/19/2022: TBD 07/05/2022: TBD 07/2022: NT-Pinch meter out for calibration. 02/03/2022: Right: 6#, Left: 4# 12/09/2021:  Right: 6#, Left: 4#   Time 12  Period Weeks  Status Ongoing            OT LONG TERM GOAL  #8  TITLE   Baseline   Time   Period   Status    OT LONG TERM GOAL  #9  TITLE Pt. will complete plant care with modified independence.  Baseline 10/27/2022: Pt. Is able to water plants that are closer, and  within reach. Pt. continues to have difficulty reaching for thorough plant care. 09/20/2022: Pt. Continues to have difficulty reaching for thorough plant care. 09/13/22: continues to report intermittent difficulty 07/19/2022: Pt. Continues to be able to water, and care for some  of her plants. Pt. Has more difficulty with plants that are harder to reach. 07/05/2022: pt. Is able to water, and care for some  of her  plants. Pt. Has more difficulty with plants that are harder to reach. 04/07/2022: Pt. is now able to hold a cup and water her plants.02/03/2022: Pt. has difficulty caring for her plants.   Time 12  Period Weeks  Status Ongoing   OT LONG TERM GOAL  #10  TITLE Pt. will demonstrate adaptive techniques to assist with the efficiency of self-dressing, or morning care tasks.  Baseline 10/27/2022: Pt. is able to assist with initiating UE dressing. Pt. requires assist from personal, and staff care aides. 09/20/2022: Pt. continues to require assist form personal, and staff care aides. 09/13/22: MOD A dressing, reports she is often rushed 07/19/2022: Pt. continues to require assist with self-dressing/morning care tasks.   07/05/2022: Continue with goal.  04/07/2022: Pt. Continues to require assist with the efficiency of self-dressing, and morning care tasks.02/03/2022: Pt. requires assist from caregivers 2/2 time limitations during morning care.   Time 12  Period Weeks  Status Ongoing  OT LONG TERM GOAL  #11 Title Pt.  will improve BUE strength to improve ADL, and IADL functioning.  Baseline 10/27/2022: shoulder flexion L 4/5, R 3+/5;  shoulder abduction L 4+/5, R 3+/5. elbow flexion B 5/5, elbow extension 4/5 09/20/2022: Pt. Presents with limited BUE strength 09/13/22: shoulder flexion L 4/5, R 3/5; elbow flexion B  5/5, elbow extension 4-/5; shoulder abduction L 4+/5, R 3+/5. 07/19/2022:  BUE strength continues to be limited. 07/05/2022: shoulder flexion: right 4-/5, abduction: 3+/5, elbow flexion: right: 5/5, left 5/5, extension: right: 4-/5, left 4-/5, wrist extension: right: 3-/5, left: 2-/5  Time 12   Period Weeks   Status Ongoing  OT LONG TERM GOAL  #12 Title Pt.  will improve  bilateral thumb radial abduction in order to be able to hold the grab bars while standing with PT  Baseline 10/27/2022:  thumb radial abduction: R: 30(42), L: 20(24)  Time 12   Period Weeks   Status New  OT LONG TERM GOAL  #13 Title Pt.   Will be able to securely hold, and stabilize medication bottles at a tabletop surface when opening, and closing them.  Baseline 10/27/2022:  Pt. Stabilizes bottles against her torso when attempting to open, and close them   Time 12   Period Weeks   Status New     Plan - 07/22/21 1737     Clinical Impression Pt tolerated increased resistance on UE bike for 8 min. Moved [place] rings through 3 levels of rungs with R and L hand seperately, increased time on L hand. IND opens pill bottles. Pt. continues to benefit from working on impoving ROM, and UE strength in order to work towards increasing bilateral hand grasp on objects, improving UE self-dressing tasks, stabilizing items while opening them, reaching to water her plants, holding grab bars while attempting to stand, and increasing engagement of bilateral hands during ADLs, and IADL tasks.    OT Occupational Profile and History Detailed Assessment- Review of Records and additional review of physical, cognitive, psychosocial history related to current functional performance    Occupational performance deficits (Please refer to evaluation for details): ADL's;IADL's;Leisure;Social Participation    Body Structure / Function / Physical Skills ADL;Continence;Dexterity;Flexibility;Strength;ROM;Balance;Coordination;FMC;IADL;Endurance;UE functional use;Decreased knowledge of use of DME;[facility]    Psychosocial Skills Coping Strategies    Rehab Potential Fair    Clinical Decision Making Several treatment options, min-mod task modification necessary    Comorbidities Affecting Occupational Performance: Presence of comorbidities impacting occupational performance    Comorbidities impacting occupational performance description: contractures of bilateral hands, dependent transfers    Modification or Assistance to Complete Evaluation  No modification of tasks or assist necessary to complete eval    OT Frequency 2x / week    OT Duration 12 weeks    OT  Treatment/Interventions Self-care/ADL training;Cryotherapy;Therapeutic exercise;DME and/or AE instruction;Balance training;Neuromuscular education;Manual Therapy;Splinting;Moist Heat;Passive range of motion;Therapeutic activities;Patient/family education    Plan continue to progress ROM of digits, wrists and shoulders as it pertains to completion of ADL tasks that pt values.    Consulted and Agree with Plan of Care Patient          Kathie Dike, M.S. OTR/L  11/01/22, 11:07 AM  ascom [phone]

## 2022-11-03 ENCOUNTER — Ambulatory Visit: Payer: Medicare HMO | Attending: Internal Medicine | Admitting: Occupational Therapy

## 2022-11-03 ENCOUNTER — Ambulatory Visit: Payer: Medicare HMO

## 2022-11-03 DIAGNOSIS — S72002A Fracture of unspecified part of neck of left femur, initial encounter for closed fracture: Secondary | ICD-10-CM | POA: Diagnosis present

## 2022-11-03 DIAGNOSIS — S14129S Central cord syndrome at unspecified level of cervical spinal cord, sequela: Secondary | ICD-10-CM | POA: Diagnosis present

## 2022-11-03 DIAGNOSIS — R2689 Other abnormalities of gait and mobility: Secondary | ICD-10-CM | POA: Diagnosis present

## 2022-11-03 DIAGNOSIS — R269 Unspecified abnormalities of gait and mobility: Secondary | ICD-10-CM | POA: Diagnosis present

## 2022-11-03 DIAGNOSIS — S72001A Fracture of unspecified part of neck of right femur, initial encounter for closed fracture: Secondary | ICD-10-CM | POA: Insufficient documentation

## 2022-11-03 DIAGNOSIS — R296 Repeated falls: Secondary | ICD-10-CM | POA: Insufficient documentation

## 2022-11-03 DIAGNOSIS — R262 Difficulty in walking, not elsewhere classified: Secondary | ICD-10-CM | POA: Insufficient documentation

## 2022-11-03 DIAGNOSIS — M6281 Muscle weakness (generalized): Secondary | ICD-10-CM

## 2022-11-03 DIAGNOSIS — R278 Other lack of coordination: Secondary | ICD-10-CM

## 2022-11-03 DIAGNOSIS — I10 Essential (primary) hypertension: Secondary | ICD-10-CM | POA: Insufficient documentation

## 2022-11-03 DIAGNOSIS — R2681 Unsteadiness on feet: Secondary | ICD-10-CM | POA: Insufficient documentation

## 2022-11-03 DIAGNOSIS — M542 Cervicalgia: Secondary | ICD-10-CM | POA: Diagnosis present

## 2022-11-03 NOTE — Therapy (Signed)
OUTPATIENT PHYSICAL THERAPY NEURO TREATMENT  Patient Name: Sherry [name] MRN: [id number] DOB:1934-08-08, 88 y.o., female Today's Date: 11/03/2022   PCP: Sherry Mealing, MD REFERRING PROVIDER: Deeann [name]   PT End of Session - 11/03/22 1048     Visit Number 75    Number of Visits 92    Date for PT Re-Evaluation 12/29/22    Progress Note Due on Visit 80    PT Start Time 1145    Equipment Utilized During Treatment Gait belt    Activity Tolerance Patient tolerated treatment well;No increased pain;Patient limited by fatigue    Behavior During Therapy Sherry [name] for tasks assessed/performed                Past Medical History:  Diagnosis Date   Acute blood loss anemia    Arthritis    Cancer (HCC)    skin   Central cord syndrome at C6 level of cervical spinal cord (HCC) 11/29/2017   Hypertension    Protein-calorie malnutrition, severe (HCC) 01/24/2018   S/P insertion of IVC (inferior vena caval) filter 01/24/2018   Tetraparesis (HCC)    Past Surgical History:  Procedure Laterality Date   ANTERIOR CERVICAL DECOMP/DISCECTOMY FUSION N/A 11/29/2017   Procedure: Cervical five-six, six-seven Anterior Cervical Decompression Fusion;  Surgeon: Sherry Pierini, MD;  Location: [facility] OR;  Service: Neurosurgery;  Laterality: N/A;  Cervical five-six, six-seven Anterior Cervical Decompression Fusion   CATARACT EXTRACTION     FEMUR IM NAIL Left 08/16/2021   Procedure: INTRAMEDULLARY (IM) NAIL FEMORAL;  Surgeon: Sherry Saint, MD;  Location: [facility] ORS;  Service: Orthopedics;  Laterality: Left;   IR IVC FILTER PLMT / S&I /IMG GUID/MOD SED  12/08/2017   Patient Active Problem List   Diagnosis Date Noted   Age-related osteoporosis without current pathological fracture 07/27/2022   Mild recurrent major depression (HCC) 07/27/2022   Hypertensive heart disease with other congestive heart failure (HCC) 02/14/2022   Chronic indwelling Foley catheter 02/14/2022   Closed hip fracture (HCC)  08/15/2021   DVT (deep venous thrombosis) (HCC) 08/15/2021   Quadriplegia (HCC) 08/15/2021   Fall 08/15/2021   Constipation due to slow transit 08/31/2018   Trauma 06/05/2018   Neuropathic pain 06/05/2018   Neurogenic bowel 06/05/2018   Vaginal yeast infection 01/30/2018   Healthcare-associated pneumonia 01/25/2018   Chronic allergic rhinitis 01/24/2018   Depression with anxiety 01/24/2018   UTI due to Klebsiella species 01/24/2018   Protein-calorie malnutrition, severe (HCC) 01/24/2018   S/P insertion of IVC (inferior vena caval) filter 01/24/2018   Tetraparesis (HCC) 01/20/2018   Neurogenic bladder 01/20/2018   Reactive depression    Benign essential HTN    Acute postoperative anemia due to expected blood loss    Central cord syndrome at C6 level of cervical spinal cord (HCC) 11/29/2017   Allergy to alpha-gal 11/25/2016   SCC (squamous cell carcinoma) 04/08/2014    ONSET DATE: 08/15/2021 (fall with B hip fx); Initial injury was in 2019- quadraplegia due to central cord syndrome of C6 secondary to neck fracture.   REFERRING DIAG: Bilateral Hip fx; Left Open- s/p Left IM nail femoral on 08/16/2021; Right closed  THERAPY DIAG:  Muscle weakness (generalized)  Other lack of coordination  Central cord syndrome, sequela (HCC)  Abnormality of gait and mobility  Difficulty in walking, not elsewhere classified  Closed fracture of both hips, initial encounter (HCC)  Unsteadiness on feet  Rationale for Evaluation and Treatment Rehabilitation  SUBJECTIVE:   SUBJECTIVE STATEMENT:  Patient reports feeling very  bloated today and feeling "yucky" but agreeable to do some strengthening as able.    Pt accompanied by:  Paid caregiver-   PERTINENT HISTORY: Sherry [name] is an 87yoF who experienced a fall at home on 08/15/2021 with hip fracture, s/p Left hip ORIF. PMH: quadriplegia due to central cord syndrome of C6 secondary to neck fracture, HTN, depression with anxiety, neurogenic  bladder, bilateral DVT on Xarelto (s/p of IVC filter placement). Prior history of significant PT/OT with improvement of function. Pt has full use of shoulder/elbows, limited use of hands, adaptive self feeding sucessful.     PAIN:  None current but does endorse some left thigh pain intermittent  OBJECTIVE:    TODAY'S TREATMENT:  11/03/22    THEREX:    AROM- Ankle DF/PF on right 2 sets of 15 Manual stretching to left ankle DF- hold 30 sec x 4 LAQ (AROM) 3 sets of 10 reps Seated ham curl RTB- 2 sets of 15 reps Hip add squeeze with ball- hold 3 sec x 15 reps  Hip abd with RTB - 2 sets of 15 reps  Seated hip flex (AROM on left  and AAROM on right)  x 15     PATIENT EDUCATION: Education details: cues for isolated joint exercises  HOME EXERCISE PROGRAM: No changes at this time  Access Code: [name] URL: [email or web address] Date: 11/23/2021 Prepared by: Precious Bard Exercises - Seated Gluteal Sets - 2 x daily - 7 x weekly - 2 sets - 10 reps - 5 hold - Seated Quad Set - 2 x daily - 7 x weekly - 2 sets - 10 reps - 5 hold - Seated Long Arc Quad - 2 x daily - 7 x weekly - 2 sets - 10 reps - 5 hold - Seated March - 2 x daily - 7 x weekly - 2 sets - 10 reps - 5 hold - Seated Hip Abduction - 2 x daily - 7 x weekly - 2 sets - 10 reps - 5 hold - Seated Shoulder Shrugs - 2 x daily - 7 x weekly - 2 sets - 10 reps - 5 hold - Wheelchair Pressure Relief - 2 x daily - 7 x weekly - 2 sets - 10 reps - 5 hold  GOALS: Goals reviewed with patient? Yes  SHORT TERM GOALS: Target date: 02/22/2022  Pt will be independent with initial UE strengthening HEP in order to improve strength and balance in order to decrease fall risk and improve function at home and work. Baseline: 10/19/2021- patient with no formal UE HEP; 12/02/2021= Patient verbalized knowledge of HEP including use of theraband for UE strengthening.  Goal status: GOAL MET  LONG TERM GOALS: Target date: 12/29/2022  Pt will be  independent with final for UE/LE HEP in order to improve strength and balance in order to decrease fall risk and improve function at home and work. Baseline: Patient is currently BLE NWB and unable to participate in HEP. Has order for UE strengthening. 01/11/2022- Patient now able to participate in LE strengthening although NWB still- good understanding for some basic exercises. Will keep goal active to incorporate progressive LE strengthening exercises. 04/07/2022= Patient still participating in progressive LE seated HEP and has no questions at this time.  Goal status: MET  2.  Pt will improve FOTO to target score of 35  to display perceived improvements in ability to complete ADL's.  Baseline: 10/19/2021= 12; 12/02/2021= 17; 01/11/2022=17; 04/07/2022= 17; 07/05/2022=17; 10/06/2022- outcome measure not appropriate as patient in non-ambulatory and  not consistently standing. Goal status: Goal not appropriate  3.  Pt will increase strength of B UE  by at least 1/2 MMT grade in order to demonstrate improvement in strength and function  Baseline: patient has range of 2-/5 to 4/5 BUE Strength; 12/02/2021= 4/5 except with wrist ext. 01/11/2022= 4/5 B UE strength expept for wrist Goal status: Goal revised-no longer appropriate- working with OT on all UE strengthening.   4. Pt. Will increase strength of RLE by at least 1/2 MMT grade in order to demonstrate improvement in Standing/transfers.  Baseline: 2-/5 Right hip flex/knee ext/flex; 04/07/2022= 2- with right hip flex/knee ext (lacking 28 deg from zero) 4/8= Patient able to ext right knee lacking 18 deg from zero. 07/05/2022=Left knee approx 8 deg from zero and right knee = 23 deg from zero  Goal status: Progressing  5. Pt. Will demo ability to stand pivot transfer with max assist for improved functional mobility and less dependent need on mechanical device.   Baseline: Dependent on hoyer lift for all transfers; 04/07/2022- Unable to test secondary to patient with recent  UTI and right hand procedure - will attempt to reassess next visit. ; 04/12/22: Unable to successfully stand due to feet plates on loaned power w/c; 05/10/2022- Patient still having to use rental chair and has not received her original power w/c back to practice SPT. 07/04/2021= Patient just received new power w/c and attempted standing from 1st time today- Patient able to stand with max assist today from w/c and did not attempt to perform pivot transfer- difficulty with static standing and placing weight on right LE. 10/06/2022- Not assessed today- patient needs new AFO prior to attempting transfers  Goal status: ONGOING  6. Pt. Will demonstrate improved functional LE strength as seen by ability to stand > 2 min for improved transfer ability and pregait abilities.   Baseline: not assessed today due to recent dx: UTI; 04/07/2022- Unable to test secondary to patient with recent UTI and right hand procedure - will attempt to reassess next visit; 04/12/22: Unable to successfully stand due to feet plates on loaned power w/c 05/10/2022- Patient still having to use rental chair and has not received her original power w/c back to practice Standing. 07/05/2022- Patient able to stand with max assist at trunk today for 2 min 3 sec. Will keep goal active to ensure consistency. 10/06/2022= Unable to assess today- patient with some recent blood pressure issues and needs new AFO.   Goal status: ONGOING  ASSESSMENT:  CLINICAL IMPRESSION: Treatment limited secondary to patient not feeling well. She performed well overall- fatigued yet able to progress with some light resistance and good motivation to complete sets today with minimal assist or encouragement. . Pt will continue to benefit from skilled PT services to optimize independence and reduced caregiver support.    OBJECTIVE IMPAIRMENTS Abnormal gait, decreased activity tolerance, decreased balance, decreased coordination, decreased endurance, decreased mobility, difficulty  walking, decreased ROM, decreased strength, hypomobility, impaired flexibility, impaired UE functional use, postural dysfunction, and pain.   ACTIVITY LIMITATIONS carrying, lifting, bending, sitting, standing, squatting, sleeping, stairs, transfers, bed mobility, continence, bathing, toileting, dressing, self feeding, reach over head, hygiene/grooming, and caring for others  PARTICIPATION LIMITATIONS: meal prep, cleaning, laundry, medication management, personal finances, interpersonal relationship, driving, shopping, community activity, and yard work  PERSONAL FACTORS Age, Time since onset of injury/illness/exacerbation, and 1-2 comorbidities: HTN, cervical Sx  are also affecting patient's functional outcome.   REHAB POTENTIAL: Good  CLINICAL DECISION MAKING: Evolving/moderate complexity  EVALUATION COMPLEXITY: Moderate  PLAN: PT FREQUENCY: 1-2x/week  PT DURATION: 12 weeks  PLANNED INTERVENTIONS: Therapeutic exercises, Therapeutic activity, Neuromuscular re-education, Balance training, Gait training, Patient/Family education, Self Care, Joint mobilization, DME instructions, Dry Needling, Electrical stimulation, Wheelchair mobility training, Spinal mobilization, Cryotherapy, Moist heat, and Manual therapy  PLAN FOR NEXT SESSION: strength, core training, motor control postural righting performance endurance   11:50 AM, 11/03/22 Louis Meckel, PT Physical Therapist - [facility]  Outpatient Physical Therapy- Main Campus [phone]

## 2022-11-03 NOTE — Therapy (Addendum)
Occupational Therapy Neuro Treatment Note   Patient Name: Sherry [name] MRN: [id number] DOB:10-03-1934, 88 y.o., female Today's Date: 09/20/2022                                 PCP: Dr. Tillman Abide REFERRING PROVIDER: Dr. Earnestine Mealing   OT End of Session -     Visit Number [id number]   Number of Visits 96    Date for OT Re-Evaluation 01/19/2023   Authorization Time Period Progress report period starting 10/27/2022    OT Start Time 1100    OT Stop Time 1145    OT Time Calculation (min) 45 min    Equipment Utilized During Treatment powered wheelchair    Activity Tolerance Patient tolerated treatment well    Behavior During Therapy WFL for tasks assessed/performed               Past Medical History:  Diagnosis Date   Acute blood loss anemia    Arthritis    Cancer (HCC)    skin   Central cord syndrome at C6 level of cervical spinal cord (HCC) 11/29/2017   Hypertension    Protein-calorie malnutrition, severe (HCC) 01/24/2018   S/P insertion of IVC (inferior vena caval) filter 01/24/2018   Tetraparesis (HCC)    Past Surgical History:  Procedure Laterality Date   ANTERIOR CERVICAL DECOMP/DISCECTOMY FUSION N/A 11/29/2017   Procedure: Cervical five-six, six-seven Anterior Cervical Decompression Fusion;  Surgeon: Jadene Pierini, MD;  Location: [facility] OR;  Service: Neurosurgery;  Laterality: N/A;  Cervical five-six, six-seven Anterior Cervical Decompression Fusion   CATARACT EXTRACTION     FEMUR IM NAIL Left 08/16/2021   Procedure: INTRAMEDULLARY (IM) NAIL FEMORAL;  Surgeon: Deeann Saint, MD;  Location: [facility] ORS;  Service: Orthopedics;  Laterality: Left;   IR IVC FILTER PLMT / S&I /IMG GUID/MOD SED  12/08/2017   Patient Active Problem List   Diagnosis Date Noted   Age-related osteoporosis without current pathological fracture 07/27/2022   Mild recurrent major depression (HCC) 07/27/2022   Hypertensive heart disease with other congestive heart failure (HCC) 02/14/2022    Chronic indwelling Foley catheter 02/14/2022   Closed hip fracture (HCC) 08/15/2021   DVT (deep venous thrombosis) (HCC) 08/15/2021   Quadriplegia (HCC) 08/15/2021   Fall 08/15/2021   Constipation due to slow transit 08/31/2018   Trauma 06/05/2018   Neuropathic pain 06/05/2018   Neurogenic bowel 06/05/2018   Vaginal yeast infection 01/30/2018   Healthcare-associated pneumonia 01/25/2018   Chronic allergic rhinitis 01/24/2018   Depression with anxiety 01/24/2018   UTI due to Klebsiella species 01/24/2018   Protein-calorie malnutrition, severe (HCC) 01/24/2018   S/P insertion of IVC (inferior vena caval) filter 01/24/2018   Tetraparesis (HCC) 01/20/2018   Neurogenic bladder 01/20/2018   Reactive depression    Benign essential HTN    Acute postoperative anemia due to expected blood loss    Central cord syndrome at C6 level of cervical spinal cord (HCC) 11/29/2017   Allergy to alpha-gal 11/25/2016   SCC (squamous cell carcinoma) 04/08/2014    REFERRING DIAG: Central Cord Syndrome at C6 of the Cervical Spinal Cord, Fall with Bilateral Closed Hip Fractures, with ORIF repair with Intramedullary Nailing of the Left Hip Fracture.  THERAPY DIAG:   Muscle weakness (generalized)  Other lack of coordination  Rationale for Evaluation and Treatment Rehabilitation  PERTINENT HISTORY: Patient s/p fall November 29, 2017 resulting in diagnosis of central cord syndrome  at C 6 level.  She has had therapy in multiple venues but with recent move to [facility], therapy staff recommended she seek outpatient therapy for her needs. Pt. Had a recent fall with Bilateral Closed Hip Fractures, with ORIF repair with Intramedullary Nailing of the Left Hip Fracture.   PRECAUTIONS: None  SUBJECTIVE:   Pt. Reports doing well today PAIN: Are you having pain No  OBJECTIVE:    Today's Treatment:    Manual Therapy:   Pt. tolerated soft tissue massage to the volar, and dorsal surface of each digit on the  bilateral hands, and thenar eminence secondary to stiffness following moist heat modality. Soft tissue mobilization was performed for Carpal, and metacarpal spread stretches. Manual therapy was performed independent of, and in preparation for therapeutic Ex.   Therapeutic Ex.    Pt. tolerated PROM followed by AROM bilateral wrist extension, PROM for bilateral digit MP, PIP, and DIP flexion, and extension, thumb radial, and palmar abduction. Emphasis was placed on left wrist extension with moist heat modality 2/2 tightness. Pt. Worked on BUE strengthening, and reciprocal motion using the UBE while seated for 8 min. with no resistance. Constant monitoring was provided.         PATIENT EDUCATION: RUE Distal ROM, and strength Person educated: Patient Education method: Explanation and Demonstration Education comprehension: verbalized understanding, returned demonstration, and needs further education   HOME EXERCISE PROGRAM Continue with ongoing HEPs  Measurements:  12/09/2021:  Shoulder flexion: R: 120(136), L: 138(150) Shoulder abduction: R: 108(120), L: 110(120) Elbow: R: 0-148, L: 0-146 Wrist flexion: R: 55(62), L: 78 Writ extension: R: 34(55), L: 3(4) RD: R: 12(20), L: 16(26) UD: R: 8(24), L: 6(18) Thumb radial abduction: R: 11(20), L: 8(20) Digit flexion to the [facility]: R:  2nd: 5cm(3.5cm), 3rd: 7.5 cm(5cm), 4th: 7cm(3cm), 5th: 6cm(5.5cm) L: 2nd: 8cm(8cm), 3rd: 8cm(8cm), 4th: 7.5cm(7cm), 5th: 7cm(7cm)  02/03/2022:   Shoulder flexion: R: 122(138), L: 148(154) Shoulder abduction: R: 109(125), L: 125(125 Elbow: R: 0-148, L: 0-146 Wrist flexion: R: 60(68), L: 79 Writ extension: R: 40(55), L: 3(10) RD: R: 14(24), L: 20(28) UD: R: 8(24), L: 6(18) Thumb radial abduction: R: 20(25), L: 8(20) Digit flexion to the [facility]: R:  2nd: 4.5cm(3cm), 3rd: 6.5cm(4.5cm), 4th: 6.5 cm(3cm), 5th: 4.5cm(5cm) L: 2nd: 8cm(8cm), 3rd: 8cm(7cm), 4th: 7.5cm(7cm), 5th: 6.5cm(6.5cm)   04/07/2022:    Shoulder flexion: R: 125(150), L: 160(160) Shoulder abduction: R: 109(125), L: 125(125 Elbow: R: 0-148, L: 0-146 Wrist flexion: R: 60(68), L: 79 Writ extension: R: 40(55), L: 5(18) RD: R: 14(24), L: 20(28) UD: R: 18(24), L: 8(18) Thumb radial abduction: R: 20(25), L: 8(20) Digit flexion to the [facility]: R:  2nd: 4.5 cm(4 cm), 3rd: 6.5cm(3cm), 4th: 7 cm (5cm), 5th: 5cm(5cm) L: 2nd: 8cm(8cm), 3rd: 8cm(7cm), 4th: 7.5cm(7cm), 5th: 7cm(7cm)   07/05/2022:   Shoulder flexion: R: 125(154), L: 160(160) Shoulder abduction: R: 95(130), L: 143(143) Elbow: R: 0-148, L: 0-146 Wrist flexion: R: 60(68), L: 79 Writ extension: R: 40(60), L: 0(12) RD: R: 10(24), L: 12(20) UD: R: 16(24), L: 8(16) Thumb radial abduction: R: 20(25), L: 8(20) Digit flexion to the [facility]: R:  2nd: 4 cm(4cm), 3rd: 6.5cm( 3.5cm), 4th: 7 cm (5cm), 5th: 6cm(5cm) L: 2nd: 8cm(8cm), 3rd: 8cm(7cm), 4th: 8cm(7cm), 5th: 7cm(7cm)  09/13/2022:   Shoulder flexion: R: 125(154), L: 160(160) Shoulder abduction: R: 100(130), L: 143(143) Elbow: R: 0-150, L: 0-150 Wrist flexion: R: 55(68), L: 79 Writ extension: R: 45(60), L: 0(12) RD: R: 10(24), L: 12(20) UD: R: 16(24), L: 8(16) Thumb radial  abduction: R: 20(25), L: 8(20) Digit flexion to the [facility]: R:  2nd: 4 cm(4cm), 3rd: 6cm( 5cm), 4th: 6 cm (4cm), 5th: 5cm(5cm) L: 2nd: 7cm(cm), 3rd: 7cm(6cm), 4th: 7cm(6cm), 5th: 6cm(6cm)  10/27/2022   Shoulder flexion: R: 161(096), L: 160(160) Shoulder abduction: R: 105(130), L: 143(143) Elbow: R: 0-150, L: 0-150 Wrist flexion: R: 55(68), L: 79 Writ extension: R: 46(64), L: 0(20) RD: R: 10(20), L: 18(24) UD: R: 118(28), L: 8(20) Thumb radial abduction: R: 30(42), L:20(24) Digit flexion to the [facility]: R:  2nd: 4 cm(3cm), 3rd: 6cm( 6cm), 4th: 6 cm (5cm), 5th: 5cm(5cm) L: 2nd: 8cm(8cm), 3rd: 8cm(6cm), 4th: 8cm(7cm), 5th: 6cm(6cm)       OT Long Term Goals - 07/06/21 1106                                                                                           Target Date: 01/19/2023        OT LONG TERM GOAL #1   Title    Baseline    Time    Period    Status      OT LONG TERM GOAL #2   Title    Baseline    Time    Period    Status                OT LONG TERM GOAL #3   Title Patient will demonstrate improved composite finger flexion to be able to firmly hold  and use adaptive devices during ADLs, and IADLs.   Baseline 10/27/2022: Pt. presents with increased MP, PIP, and DIP digit tightness/stiffness limiting the formulation of bilateral composites fists during this progress reporting period limiting. Digit flexion to the [facility]: R:  2nd: 4 cm(3cm), 3rd: 6cm( 6cm), 4th: 6 cm (5cm), 5th: 5cm(5cm), L: 2nd: 8cm(8cm), 3rd: 8cm(6cm), 4th: 8cm(7cm), 5th: 6cm(6cm) 09/20/2022: Improving digit flexion to the [facility]. 09/13/22: improved digit flexion to the [facility]. 07/05/2022: Pt. presents with digit MP, PIP, and DIP extensor tightness limiting her ability to securely grip objects in her bilateral hands.  04/07/2022: Pt. Has improved with right 2nd, and 3rd digit flexion towards the [facility]. Pt. Continues to have difficulty securely holding and applying deodorant.02/03/2022: Pt. Has improved with digit flexion, however continues to have difficulty securely holding and using deoderant. 12/09/2021: Bilateral hand/digit MP, PIP, and DIP extension tightness limits her ability achieve digit flexion to hold, and apply deodorant. 10/28/2021:  Pt. continues to have difficulty holding the deodorant. 01/12/2021: Pt. Presents with limited digit extension. Pt. Is able to initiate holding deodorant, however is unable to hold it while using it.   Time 12    Period Weeks    Status Ongoing   Target Date                  OT LONG TERM GOAL #4   Title Pt. Will improve bilateral wrist extension in preparation for anticipating, and initiating reaching for objects at the table.    Baseline 10/27/2022: Pt. Is improving with bilateral wrist extension. R: 46(64) L: 0(20) 09/20/2022:  limited PROM in the Left wrist extension 2/2 tightness/stiffness. 09/13/22: R: 55(68), L: 0(12) 07/19/2022:  Bilateral wrist extension in limited. 07/05/2022: Right: 40(60) Left: 0(12) 04/07/2022: Rght: 40(55) Left: 5(18) 02/03/2022: Right: 34(55) Left: 3(4) 12/09/2021: Right  34(55)  Left  3(4) 10/28/2021:  Right 22(38), Left 0(15) 09/14/2021:  Right: 17(35), left 2(15)   Time 12   Period weeks   Status Improving/Ongoing   Target Date      OT LONG TERM GOAL #5   Title    Baseline    Time    Period    Status          OT LONG TERM GOAL  #6   TITLE    Baseline    Time    Period    Status        OT LONG TERM GOAL  #7  TITLE Pt. Will increase  bilateral lateral pinch strength by 2 lbs to be able to securely grasp items during ADLs, and IADL tasks.   Baseline 10/27/2023: Pt. Is improving with holding items with her bilateral thumbs. (Pinch meter out for calibration) 09/20/2022: lateral pinch continues to be limited 09/13/22: R 6.5# L 4# 07/19/2022: TBD 07/05/2022: TBD 07/2022: NT-Pinch meter out for calibration. 02/03/2022: Right: 6#, Left: 4# 12/09/2021:  Right: 6#, Left: 4#   Time 12  Period Weeks  Status Ongoing            OT LONG TERM GOAL  #8  TITLE   Baseline   Time   Period   Status    OT LONG TERM GOAL  #9  TITLE Pt. will complete plant care with modified independence.  Baseline 10/27/2022: Pt. Is able to water plants that are closer, and  within reach. Pt. continues to have difficulty reaching for thorough plant care. 09/20/2022: Pt. Continues to have difficulty reaching for thorough plant care. 09/13/22: continues to report intermittent difficulty 07/19/2022: Pt. Continues to be able to water, and care for some  of her plants. Pt. Has more difficulty with plants that are harder to reach. 07/05/2022: pt. Is able to water, and care for some  of her plants. Pt. Has more difficulty with plants that are harder to reach. 04/07/2022: Pt. is now able to hold a cup and water her plants.02/03/2022:  Pt. has difficulty caring for her plants.   Time 12  Period Weeks  Status Ongoing   OT LONG TERM GOAL  #10  TITLE Pt. will demonstrate adaptive techniques to assist with the efficiency of self-dressing, or morning care tasks.  Baseline 10/27/2022: Pt. is able to assist with initiating UE dressing. Pt. requires assist from personal, and staff care aides. 09/20/2022: Pt. continues to require assist form personal, and staff care aides. 09/13/22: MOD A dressing, reports she is often rushed 07/19/2022: Pt. continues to require assist with self-dressing/morning care tasks.   07/05/2022: Continue with goal.  04/07/2022: Pt. Continues to require assist with the efficiency of self-dressing, and morning care tasks.02/03/2022: Pt. requires assist from caregivers 2/2 time limitations during morning care.   Time 12  Period Weeks  Status Ongoing  OT LONG TERM GOAL  #11 Title Pt.  will improve BUE strength to improve ADL, and IADL functioning.  Baseline 10/27/2022: shoulder flexion L 4/5, R 3+/5;  shoulder abduction L 4+/5, R 3+/5. elbow flexion B 5/5, elbow extension 4/5 09/20/2022: Pt. Presents with limited BUE strength 09/13/22: shoulder flexion L 4/5, R 3/5; elbow flexion B 5/5, elbow extension 4-/5; shoulder abduction L 4+/5, R 3+/5. 07/19/2022:  BUE strength continues to be limited. 07/05/2022: shoulder flexion: right 4-/5, abduction:  3+/5, elbow flexion: right: 5/5, left 5/5, extension: right: 4-/5, left 4-/5, wrist extension: right: 3-/5, left: 2-/5  Time 12   Period Weeks   Status Ongoing  OT LONG TERM GOAL  #12 Title Pt.  will improve  bilateral thumb radial abduction in order to be able to hold the grab bars while standing with PT  Baseline 10/27/2022:  thumb radial abduction: R: 30(42), L: 20(24)  Time 12   Period Weeks   Status New  OT LONG TERM GOAL  #13 Title Pt.  Will be able to securely hold, and stabilize medication bottles at a tabletop surface when opening, and closing them.  Baseline 10/27/2022:   Pt. Stabilizes bottles against her torso when attempting to open, and close them   Time 12   Period Weeks   Status New     Plan - 07/22/21 1737     Clinical Impression Pt. continues to tolerate manual therapy, there. Ex., and ROM well.  Pt. is responding well to prolonged gentle stretching of the bilateral thumb web spaces. Pt. continues to present with tightness at the bilateral thumb webspace, and bilateral wrist flexor tightness, as well as digit MP, PIP, and DIP extensor tightness which continues to limit her ability to complete ADL tasks efficiently. Pt. continues to benefit from working on impoving ROM, and UE strength in order to work towards increasing bilateral hand grasp on objects, and increasing engagement of bilateral hands during ADLs, and IADL tasks.  Plan to take measurements, and do the progress report update next visit.      OT Occupational Profile and History Detailed Assessment- Review of Records and additional review of physical, cognitive, psychosocial history related to current functional performance    Occupational performance deficits (Please refer to evaluation for details): ADL's;IADL's;Leisure;Social Participation    Body Structure / Function / Physical Skills ADL;Continence;Dexterity;Flexibility;Strength;ROM;Balance;Coordination;FMC;IADL;Endurance;UE functional use;Decreased knowledge of use of DME;[facility]    Psychosocial Skills Coping Strategies    Rehab Potential Fair    Clinical Decision Making Several treatment options, min-mod task modification necessary    Comorbidities Affecting Occupational Performance: Presence of comorbidities impacting occupational performance    Comorbidities impacting occupational performance description: contractures of bilateral hands, dependent transfers    Modification or Assistance to Complete Evaluation  No modification of tasks or assist necessary to complete eval    OT Frequency 2x / week    OT Duration 12 weeks    OT  Treatment/Interventions Self-care/ADL training;Cryotherapy;Therapeutic exercise;DME and/or AE instruction;Balance training;Neuromuscular education;Manual Therapy;Splinting;Moist Heat;Passive range of motion;Therapeutic activities;Patient/family education    Plan continue to progress ROM of digits, wrists and shoulders as it pertains to completion of ADL tasks that pt values.    Consulted and Agree with Plan of Care Patient          Olegario Messier, M.S. OTR/L  11/03/22, 1:32 PM

## 2022-11-04 LAB — BASIC METABOLIC PANEL
BUN: 9 (ref 4–21)
CO2: 32 — AB (ref 13–22)
Chloride: 100 (ref 99–108)
Creatinine: 0.5 (ref 0.5–1.1)
Glucose: 100
Potassium: 3.8 meq/L (ref 3.5–5.1)
Sodium: 139 (ref 137–147)

## 2022-11-04 LAB — COMPREHENSIVE METABOLIC PANEL: Calcium: 8.8 (ref 8.7–10.7)

## 2022-11-08 ENCOUNTER — Ambulatory Visit: Payer: Medicare HMO

## 2022-11-08 ENCOUNTER — Ambulatory Visit: Payer: Medicare HMO | Admitting: Occupational Therapy

## 2022-11-08 DIAGNOSIS — R262 Difficulty in walking, not elsewhere classified: Secondary | ICD-10-CM

## 2022-11-08 DIAGNOSIS — R278 Other lack of coordination: Secondary | ICD-10-CM

## 2022-11-08 DIAGNOSIS — M6281 Muscle weakness (generalized): Secondary | ICD-10-CM

## 2022-11-08 DIAGNOSIS — S14129S Central cord syndrome at unspecified level of cervical spinal cord, sequela: Secondary | ICD-10-CM

## 2022-11-08 DIAGNOSIS — R2681 Unsteadiness on feet: Secondary | ICD-10-CM

## 2022-11-08 DIAGNOSIS — R269 Unspecified abnormalities of gait and mobility: Secondary | ICD-10-CM

## 2022-11-08 DIAGNOSIS — S72001A Fracture of unspecified part of neck of right femur, initial encounter for closed fracture: Secondary | ICD-10-CM

## 2022-11-08 NOTE — Therapy (Addendum)
Occupational Therapy Neuro Treatment Note   Patient Name: Sherry [name] MRN: [id number] DOB:11-12-1934, 88 y.o., female Today's Date: 09/20/2022                                 PCP: Dr. Tillman Abide REFERRING PROVIDER: Dr. Earnestine Mealing   OT End of Session - 11/08/22 1211     Visit Number [id number]    Number of Visits 96    Date for OT Re-Evaluation 01/19/23    Authorization Time Period Progress report period starting 0812/2024    OT Start Time 1100    OT Stop Time 1145    OT Time Calculation (min) 45 min    Activity Tolerance Patient tolerated treatment well    Behavior During Therapy [facility] for tasks assessed/performed                   Past Medical History:  Diagnosis Date   Acute blood loss anemia    Arthritis    Cancer (HCC)    skin   Central cord syndrome at C6 level of cervical spinal cord (HCC) 11/29/2017   Hypertension    Protein-calorie malnutrition, severe (HCC) 01/24/2018   S/P insertion of IVC (inferior vena caval) filter 01/24/2018   Tetraparesis (HCC)    Past Surgical History:  Procedure Laterality Date   ANTERIOR CERVICAL DECOMP/DISCECTOMY FUSION N/A 11/29/2017   Procedure: Cervical five-six, six-seven Anterior Cervical Decompression Fusion;  Surgeon: Jadene Pierini, MD;  Location: [facility] OR;  Service: Neurosurgery;  Laterality: N/A;  Cervical five-six, six-seven Anterior Cervical Decompression Fusion   CATARACT EXTRACTION     FEMUR IM NAIL Left 08/16/2021   Procedure: INTRAMEDULLARY (IM) NAIL FEMORAL;  Surgeon: Deeann Saint, MD;  Location: [facility] ORS;  Service: Orthopedics;  Laterality: Left;   IR IVC FILTER PLMT / S&I /IMG GUID/MOD SED  12/08/2017   Patient Active Problem List   Diagnosis Date Noted   Age-related osteoporosis without current pathological fracture 07/27/2022   Mild recurrent major depression (HCC) 07/27/2022   Hypertensive heart disease with other congestive heart failure (HCC) 02/14/2022   Chronic indwelling Foley catheter  02/14/2022   Closed hip fracture (HCC) 08/15/2021   DVT (deep venous thrombosis) (HCC) 08/15/2021   Quadriplegia (HCC) 08/15/2021   Fall 08/15/2021   Constipation due to slow transit 08/31/2018   Trauma 06/05/2018   Neuropathic pain 06/05/2018   Neurogenic bowel 06/05/2018   Vaginal yeast infection 01/30/2018   Healthcare-associated pneumonia 01/25/2018   Chronic allergic rhinitis 01/24/2018   Depression with anxiety 01/24/2018   UTI due to Klebsiella species 01/24/2018   Protein-calorie malnutrition, severe (HCC) 01/24/2018   S/P insertion of IVC (inferior vena caval) filter 01/24/2018   Tetraparesis (HCC) 01/20/2018   Neurogenic bladder 01/20/2018   Reactive depression    Benign essential HTN    Acute postoperative anemia due to expected blood loss    Central cord syndrome at C6 level of cervical spinal cord (HCC) 11/29/2017   Allergy to alpha-gal 11/25/2016   SCC (squamous cell carcinoma) 04/08/2014    REFERRING DIAG: Central Cord Syndrome at C6 of the Cervical Spinal Cord, Fall with Bilateral Closed Hip Fractures, with ORIF repair with Intramedullary Nailing of the Left Hip Fracture.  THERAPY DIAG:   Muscle weakness (generalized)  Other lack of coordination  Rationale for Evaluation and Treatment Rehabilitation  PERTINENT HISTORY: Patient s/p fall November 29, 2017 resulting in diagnosis of central cord syndrome at  C 6 level.  She has had therapy in multiple venues but with recent move to [facility], therapy staff recommended she seek outpatient therapy for her needs. Pt. Had a recent fall with Bilateral Closed Hip Fractures, with ORIF repair with Intramedullary Nailing of the Left Hip Fracture.   PRECAUTIONS: None  SUBJECTIVE:   Pt. Reports  having had family in visiting over the weekend. PAIN: Are you having pain No  OBJECTIVE:    Today's Treatment:    Manual Therapy:   Pt. tolerated soft tissue massage to the volar, and dorsal surface of each digit on the  bilateral hands, and thenar eminence secondary to stiffness following moist heat modality. Soft tissue mobilization was performed for Carpal, and metacarpal spread stretches. Manual therapy was performed independent of, and in preparation for therapeutic Ex.   Therapeutic Ex.    Pt. tolerated PROM followed by AROM bilateral wrist extension, PROM for bilateral digit MP, PIP, and DIP flexion, and extension, thumb radial, and palmar abduction. Emphasis was placed on left wrist extension with moist heat modality 2/2 tightness. Pt. Worked on BUE strengthening, and reciprocal motion using the UBE while seated for 8 min. with no resistance. Constant monitoring was provided.         PATIENT EDUCATION: RUE Distal ROM, and strength Person educated: Patient Education method: Explanation and Demonstration Education comprehension: verbalized understanding, returned demonstration, and needs further education   HOME EXERCISE PROGRAM Continue with ongoing HEPs  Measurements:  12/09/2021:  Shoulder flexion: R: 120(136), L: 138(150) Shoulder abduction: R: 108(120), L: 110(120) Elbow: R: 0-148, L: 0-146 Wrist flexion: R: 55(62), L: 78 Writ extension: R: 34(55), L: 3(4) RD: R: 12(20), L: 16(26) UD: R: 8(24), L: 6(18) Thumb radial abduction: R: 11(20), L: 8(20) Digit flexion to the [facility]: R:  2nd: 5cm(3.5cm), 3rd: 7.5 cm(5cm), 4th: 7cm(3cm), 5th: 6cm(5.5cm) L: 2nd: 8cm(8cm), 3rd: 8cm(8cm), 4th: 7.5cm(7cm), 5th: 7cm(7cm)  02/03/2022:   Shoulder flexion: R: 122(138), L: 148(154) Shoulder abduction: R: 109(125), L: 125(125 Elbow: R: 0-148, L: 0-146 Wrist flexion: R: 60(68), L: 79 Writ extension: R: 40(55), L: 3(10) RD: R: 14(24), L: 20(28) UD: R: 8(24), L: 6(18) Thumb radial abduction: R: 20(25), L: 8(20) Digit flexion to the [facility]: R:  2nd: 4.5cm(3cm), 3rd: 6.5cm(4.5cm), 4th: 6.5 cm(3cm), 5th: 4.5cm(5cm) L: 2nd: 8cm(8cm), 3rd: 8cm(7cm), 4th: 7.5cm(7cm), 5th: 6.5cm(6.5cm)   04/07/2022:    Shoulder flexion: R: 125(150), L: 160(160) Shoulder abduction: R: 109(125), L: 125(125 Elbow: R: 0-148, L: 0-146 Wrist flexion: R: 60(68), L: 79 Writ extension: R: 40(55), L: 5(18) RD: R: 14(24), L: 20(28) UD: R: 18(24), L: 8(18) Thumb radial abduction: R: 20(25), L: 8(20) Digit flexion to the [facility]: R:  2nd: 4.5 cm(4 cm), 3rd: 6.5cm(3cm), 4th: 7 cm (5cm), 5th: 5cm(5cm) L: 2nd: 8cm(8cm), 3rd: 8cm(7cm), 4th: 7.5cm(7cm), 5th: 7cm(7cm)   07/05/2022:   Shoulder flexion: R: 125(154), L: 160(160) Shoulder abduction: R: 95(130), L: 143(143) Elbow: R: 0-148, L: 0-146 Wrist flexion: R: 60(68), L: 79 Writ extension: R: 40(60), L: 0(12) RD: R: 10(24), L: 12(20) UD: R: 16(24), L: 8(16) Thumb radial abduction: R: 20(25), L: 8(20) Digit flexion to the [facility]: R:  2nd: 4 cm(4cm), 3rd: 6.5cm( 3.5cm), 4th: 7 cm (5cm), 5th: 6cm(5cm) L: 2nd: 8cm(8cm), 3rd: 8cm(7cm), 4th: 8cm(7cm), 5th: 7cm(7cm)  09/13/2022:   Shoulder flexion: R: 125(154), L: 160(160) Shoulder abduction: R: 100(130), L: 143(143) Elbow: R: 0-150, L: 0-150 Wrist flexion: R: 55(68), L: 79 Writ extension: R: 45(60), L: 0(12) RD: R: 10(24), L: 12(20) UD: R:  16(24), L: 8(16) Thumb radial abduction: R: 20(25), L: 8(20) Digit flexion to the [facility]: R:  2nd: 4 cm(4cm), 3rd: 6cm( 5cm), 4th: 6 cm (4cm), 5th: 5cm(5cm) L: 2nd: 7cm(cm), 3rd: 7cm(6cm), 4th: 7cm(6cm), 5th: 6cm(6cm)  10/27/2022   Shoulder flexion: R: 161(096), L: 160(160) Shoulder abduction: R: 105(130), L: 143(143) Elbow: R: 0-150, L: 0-150 Wrist flexion: R: 55(68), L: 79 Writ extension: R: 46(64), L: 0(20) RD: R: 10(20), L: 18(24) UD: R: 118(28), L: 8(20) Thumb radial abduction: R: 30(42), L:20(24) Digit flexion to the [facility]: R:  2nd: 4 cm(3cm), 3rd: 6cm( 6cm), 4th: 6 cm (5cm), 5th: 5cm(5cm) L: 2nd: 8cm(8cm), 3rd: 8cm(6cm), 4th: 8cm(7cm), 5th: 6cm(6cm)       OT Long Term Goals - 07/06/21 1106                                                                                           Target Date: 01/19/2023        OT LONG TERM GOAL #1   Title    Baseline    Time    Period    Status      OT LONG TERM GOAL #2   Title    Baseline    Time    Period    Status                OT LONG TERM GOAL #3   Title Patient will demonstrate improved composite finger flexion to be able to firmly hold  and use adaptive devices during ADLs, and IADLs.   Baseline 10/27/2022: Pt. presents with increased MP, PIP, and DIP digit tightness/stiffness limiting the formulation of bilateral composites fists during this progress reporting period limiting. Digit flexion to the [facility]: R:  2nd: 4 cm(3cm), 3rd: 6cm( 6cm), 4th: 6 cm (5cm), 5th: 5cm(5cm), L: 2nd: 8cm(8cm), 3rd: 8cm(6cm), 4th: 8cm(7cm), 5th: 6cm(6cm) 09/20/2022: Improving digit flexion to the [facility]. 09/13/22: improved digit flexion to the [facility]. 07/05/2022: Pt. presents with digit MP, PIP, and DIP extensor tightness limiting her ability to securely grip objects in her bilateral hands.  04/07/2022: Pt. Has improved with right 2nd, and 3rd digit flexion towards the [facility] Pt. Continues to have difficulty securely holding and applying deodorant.02/03/2022: Pt. Has improved with digit flexion, however continues to have difficulty securely holding and using deoderant. 12/09/2021: Bilateral hand/digit MP, PIP, and DIP extension tightness limits her ability achieve digit flexion to hold, and apply deodorant. 10/28/2021:  Pt. continues to have difficulty holding the deodorant. 01/12/2021: Pt. Presents with limited digit extension. Pt. Is able to initiate holding deodorant, however is unable to hold it while using it.   Time 12    Period Weeks    Status Ongoing   Target Date                  OT LONG TERM GOAL #4   Title Pt. Will improve bilateral wrist extension in preparation for anticipating, and initiating reaching for objects at the table.    Baseline 10/27/2022: Pt. Is improving with bilateral wrist extension. R: 46(64) L: 0(20) 09/20/2022:  limited PROM in the Left wrist extension 2/2 tightness/stiffness. 09/13/22:  R: 55(68), L: 0(12) 07/19/2022: Bilateral wrist extension in limited. 07/05/2022: Right: 40(60) Left: 0(12) 04/07/2022: Rght: 40(55) Left: 5(18) 02/03/2022: Right: 34(55) Left: 3(4) 12/09/2021: Right  34(55)  Left  3(4) 10/28/2021:  Right 22(38), Left 0(15) 09/14/2021:  Right: 17(35), left 2(15)   Time 12   Period weeks   Status Improving/Ongoing   Target Date      OT LONG TERM GOAL #5   Title    Baseline    Time    Period    Status          OT LONG TERM GOAL  #6   TITLE    Baseline    Time    Period    Status        OT LONG TERM GOAL  #7  TITLE Pt. Will increase  bilateral lateral pinch strength by 2 lbs to be able to securely grasp items during ADLs, and IADL tasks.   Baseline 10/27/2023: Pt. Is improving with holding items with her bilateral thumbs. (Pinch meter out for calibration) 09/20/2022: lateral pinch continues to be limited 09/13/22: R 6.5# L 4# 07/19/2022: TBD 07/05/2022: TBD 07/2022: NT-Pinch meter out for calibration. 02/03/2022: Right: 6#, Left: 4# 12/09/2021:  Right: 6#, Left: 4#   Time 12  Period Weeks  Status Ongoing            OT LONG TERM GOAL  #8  TITLE   Baseline   Time   Period   Status    OT LONG TERM GOAL  #9  TITLE Pt. will complete plant care with modified independence.  Baseline 10/27/2022: Pt. Is able to water plants that are closer, and  within reach. Pt. continues to have difficulty reaching for thorough plant care. 09/20/2022: Pt. Continues to have difficulty reaching for thorough plant care. 09/13/22: continues to report intermittent difficulty 07/19/2022: Pt. Continues to be able to water, and care for some  of her plants. Pt. Has more difficulty with plants that are harder to reach. 07/05/2022: pt. Is able to water, and care for some  of her plants. Pt. Has more difficulty with plants that are harder to reach. 04/07/2022: Pt. is now able to hold a cup and water her plants.02/03/2022:  Pt. has difficulty caring for her plants.   Time 12  Period Weeks  Status Ongoing   OT LONG TERM GOAL  #10  TITLE Pt. will demonstrate adaptive techniques to assist with the efficiency of self-dressing, or morning care tasks.  Baseline 10/27/2022: Pt. is able to assist with initiating UE dressing. Pt. requires assist from personal, and staff care aides. 09/20/2022: Pt. continues to require assist form personal, and staff care aides. 09/13/22: MOD A dressing, reports she is often rushed 07/19/2022: Pt. continues to require assist with self-dressing/morning care tasks.   07/05/2022: Continue with goal.  04/07/2022: Pt. Continues to require assist with the efficiency of self-dressing, and morning care tasks.02/03/2022: Pt. requires assist from caregivers 2/2 time limitations during morning care.   Time 12  Period Weeks  Status Ongoing  OT LONG TERM GOAL  #11 Title Pt.  will improve BUE strength to improve ADL, and IADL functioning.  Baseline 10/27/2022: shoulder flexion L 4/5, R 3+/5;  shoulder abduction L 4+/5, R 3+/5. elbow flexion B 5/5, elbow extension 4/5 09/20/2022: Pt. Presents with limited BUE strength 09/13/22: shoulder flexion L 4/5, R 3/5; elbow flexion B 5/5, elbow extension 4-/5; shoulder abduction L 4+/5, R 3+/5. 07/19/2022:  BUE strength continues to be limited. 07/05/2022:  shoulder flexion: right 4-/5, abduction: 3+/5, elbow flexion: right: 5/5, left 5/5, extension: right: 4-/5, left 4-/5, wrist extension: right: 3-/5, left: 2-/5  Time 12   Period Weeks   Status Ongoing  OT LONG TERM GOAL  #12 Title Pt.  will improve  bilateral thumb radial abduction in order to be able to hold the grab bars while standing with PT  Baseline 10/27/2022:  thumb radial abduction: R: 30(42), L: 20(24)  Time 12   Period Weeks   Status New  OT LONG TERM GOAL  #13 Title Pt.  Will be able to securely hold, and stabilize medication bottles at a tabletop surface when opening, and closing them.  Baseline 10/27/2022:   Pt. Stabilizes bottles against her torso when attempting to open, and close them   Time 12   Period Weeks   Status New     Plan - 07/22/21 1737     Clinical Impression Pt. continues to tolerate manual therapy, there. Ex., and ROM well.  Pt. is responding well to prolonged gentle stretching of the bilateral thumb web spaces. Pt. continues to present with tightness at the bilateral thumb webspace, and bilateral wrist flexor tightness, as well as digit MP, PIP, and DIP extensor tightness which continues to limit her ability to complete ADL tasks efficiently. Pt. continues to benefit from working on impoving ROM, and UE strength in order to work towards increasing bilateral hand grasp on objects, and increasing engagement of bilateral hands during ADLs, and IADL tasks.  Plan to take measurements, and do the progress report update next visit.      OT Occupational Profile and History Detailed Assessment- Review of Records and additional review of physical, cognitive, psychosocial history related to current functional performance    Occupational performance deficits (Please refer to evaluation for details): ADL's;IADL's;Leisure;Social Participation    Body Structure / Function / Physical Skills ADL;Continence;Dexterity;Flexibility;Strength;ROM;Balance;Coordination;FMC;IADL;Endurance;UE functional use;Decreased knowledge of use of DME;[facility]    Psychosocial Skills Coping Strategies    Rehab Potential Fair    Clinical Decision Making Several treatment options, min-mod task modification necessary    Comorbidities Affecting Occupational Performance: Presence of comorbidities impacting occupational performance    Comorbidities impacting occupational performance description: contractures of bilateral hands, dependent transfers    Modification or Assistance to Complete Evaluation  No modification of tasks or assist necessary to complete eval    OT Frequency 2x / week    OT Duration 12 weeks    OT  Treatment/Interventions Self-care/ADL training;Cryotherapy;Therapeutic exercise;DME and/or AE instruction;Balance training;Neuromuscular education;Manual Therapy;Splinting;Moist Heat;Passive range of motion;Therapeutic activities;Patient/family education    Plan continue to progress ROM of digits, wrists and shoulders as it pertains to completion of ADL tasks that pt values.    Consulted and Agree with Plan of Care Patient          Olegario Messier, M.S. OTR/L  11/08/22, 12:15 PM

## 2022-11-08 NOTE — Therapy (Signed)
OUTPATIENT PHYSICAL THERAPY NEURO TREATMENT  Patient Name: Sherry [name] MRN: [id number] DOB:Jan 30, 1935, 88 y.o., female Today's Date: 11/08/2022   PCP: Earnestine Mealing, MD REFERRING PROVIDER: Deeann Saint   PT End of Session - 11/08/22 1137     Visit Number 76    Number of Visits 92    Date for PT Re-Evaluation 12/29/22    Progress Note Due on Visit 80    PT Start Time 1146    PT Stop Time 1222    PT Time Calculation (min) 36 min    Equipment Utilized During Treatment Gait belt    Activity Tolerance Patient tolerated treatment well;No increased pain;Patient limited by fatigue    Behavior During Therapy [facility] for tasks assessed/performed                 Past Medical History:  Diagnosis Date   Acute blood loss anemia    Arthritis    Cancer (HCC)    skin   Central cord syndrome at C6 level of cervical spinal cord (HCC) 11/29/2017   Hypertension    Protein-calorie malnutrition, severe (HCC) 01/24/2018   S/P insertion of IVC (inferior vena caval) filter 01/24/2018   Tetraparesis (HCC)    Past Surgical History:  Procedure Laterality Date   ANTERIOR CERVICAL DECOMP/DISCECTOMY FUSION N/A 11/29/2017   Procedure: Cervical five-six, six-seven Anterior Cervical Decompression Fusion;  Surgeon: Jadene Pierini, MD;  Location: [facility] OR;  Service: Neurosurgery;  Laterality: N/A;  Cervical five-six, six-seven Anterior Cervical Decompression Fusion   CATARACT EXTRACTION     FEMUR IM NAIL Left 08/16/2021   Procedure: INTRAMEDULLARY (IM) NAIL FEMORAL;  Surgeon: Deeann Saint, MD;  Location: [facility] ORS;  Service: Orthopedics;  Laterality: Left;   IR IVC FILTER PLMT / S&I /IMG GUID/MOD SED  12/08/2017   Patient Active Problem List   Diagnosis Date Noted   Age-related osteoporosis without current pathological fracture 07/27/2022   Mild recurrent major depression (HCC) 07/27/2022   Hypertensive heart disease with other congestive heart failure (HCC) 02/14/2022   Chronic  indwelling Foley catheter 02/14/2022   Closed hip fracture (HCC) 08/15/2021   DVT (deep venous thrombosis) (HCC) 08/15/2021   Quadriplegia (HCC) 08/15/2021   Fall 08/15/2021   Constipation due to slow transit 08/31/2018   Trauma 06/05/2018   Neuropathic pain 06/05/2018   Neurogenic bowel 06/05/2018   Vaginal yeast infection 01/30/2018   Healthcare-associated pneumonia 01/25/2018   Chronic allergic rhinitis 01/24/2018   Depression with anxiety 01/24/2018   UTI due to Klebsiella species 01/24/2018   Protein-calorie malnutrition, severe (HCC) 01/24/2018   S/P insertion of IVC (inferior vena caval) filter 01/24/2018   Tetraparesis (HCC) 01/20/2018   Neurogenic bladder 01/20/2018   Reactive depression    Benign essential HTN    Acute postoperative anemia due to expected blood loss    Central cord syndrome at C6 level of cervical spinal cord (HCC) 11/29/2017   Allergy to alpha-gal 11/25/2016   SCC (squamous cell carcinoma) 04/08/2014    ONSET DATE: 08/15/2021 (fall with B hip fx); Initial injury was in 2019- quadraplegia due to central cord syndrome of C6 secondary to neck fracture.   REFERRING DIAG: Bilateral Hip fx; Left Open- s/p Left IM nail femoral on 08/16/2021; Right closed  THERAPY DIAG:  Muscle weakness (generalized)  Central cord syndrome, sequela (HCC)  Abnormality of gait and mobility  Difficulty in walking, not elsewhere classified  Closed fracture of both hips, initial encounter (HCC)  Unsteadiness on feet  Rationale for Evaluation and Treatment  Rehabilitation  SUBJECTIVE:   SUBJECTIVE STATEMENT:  Patient reports feeling better today and no further bloating.    Pt accompanied by:  Paid caregiver-   PERTINENT HISTORY: Sherry [name] is an 87yoF who experienced a fall at home on 08/15/2021 with hip fracture, s/p Left hip ORIF. PMH: quadriplegia due to central cord syndrome of C6 secondary to neck fracture, HTN, depression with anxiety, neurogenic bladder,  bilateral DVT on Xarelto (s/p of IVC filter placement). Prior history of significant PT/OT with improvement of function. Pt has full use of shoulder/elbows, limited use of hands, adaptive self feeding sucessful.     PAIN:  None current but does endorse some left thigh pain intermittent  OBJECTIVE:    TODAY'S TREATMENT:  11/08/22    THEREX:   LAQ (AROM) 1 sets of 15 rep each LE Seated hip flex (AROM on left  and AAROM on right)  x 15 each LE Prolonged knee flex stretch (left) hold 60 sec x 3  Therapeutic Activities:   Sit to stand x 4- Max assist with verbal cues to tighten glutes upon completing standing for more upright posture.  Static standing x 3 trials: 2 min 34 sec; 2 min 7 sec; and 1 min 39 sec (increased hyperextension left knee on 2nd and 3rd trials)   PATIENT EDUCATION: Education details: cues for isolated joint exercises  HOME EXERCISE PROGRAM: No changes at this time  Access Code: [facility] URL: [email or web address] Date: 11/23/2021 Prepared by: Precious Bard Exercises - Seated Gluteal Sets - 2 x daily - 7 x weekly - 2 sets - 10 reps - 5 hold - Seated Quad Set - 2 x daily - 7 x weekly - 2 sets - 10 reps - 5 hold - Seated Long Arc Quad - 2 x daily - 7 x weekly - 2 sets - 10 reps - 5 hold - Seated March - 2 x daily - 7 x weekly - 2 sets - 10 reps - 5 hold - Seated Hip Abduction - 2 x daily - 7 x weekly - 2 sets - 10 reps - 5 hold - Seated Shoulder Shrugs - 2 x daily - 7 x weekly - 2 sets - 10 reps - 5 hold - Wheelchair Pressure Relief - 2 x daily - 7 x weekly - 2 sets - 10 reps - 5 hold  GOALS: Goals reviewed with patient? Yes  SHORT TERM GOALS: Target date: 02/22/2022  Pt will be independent with initial UE strengthening HEP in order to improve strength and balance in order to decrease fall risk and improve function at home and work. Baseline: 10/19/2021- patient with no formal UE HEP; 12/02/2021= Patient verbalized knowledge of HEP including use of theraband  for UE strengthening.  Goal status: GOAL MET  LONG TERM GOALS: Target date: 12/29/2022  Pt will be independent with final for UE/LE HEP in order to improve strength and balance in order to decrease fall risk and improve function at home and work. Baseline: Patient is currently BLE NWB and unable to participate in HEP. Has order for UE strengthening. 01/11/2022- Patient now able to participate in LE strengthening although NWB still- good understanding for some basic exercises. Will keep goal active to incorporate progressive LE strengthening exercises. 04/07/2022= Patient still participating in progressive LE seated HEP and has no questions at this time.  Goal status: MET  2.  Pt will improve FOTO to target score of 35  to display perceived improvements in ability to complete ADL's.  Baseline: 10/19/2021= 12;  12/02/2021= 17; 01/11/2022=17; 04/07/2022= 17; 07/05/2022=17; 10/06/2022- outcome measure not appropriate as patient in non-ambulatory and not consistently standing. Goal status: Goal not appropriate  3.  Pt will increase strength of B UE  by at least 1/2 MMT grade in order to demonstrate improvement in strength and function  Baseline: patient has range of 2-/5 to 4/5 BUE Strength; 12/02/2021= 4/5 except with wrist ext. 01/11/2022= 4/5 B UE strength expept for wrist Goal status: Goal revised-no longer appropriate- working with OT on all UE strengthening.   4. Pt. Will increase strength of RLE by at least 1/2 MMT grade in order to demonstrate improvement in Standing/transfers.  Baseline: 2-/5 Right hip flex/knee ext/flex; 04/07/2022= 2- with right hip flex/knee ext (lacking 28 deg from zero) 4/8= Patient able to ext right knee lacking 18 deg from zero. 07/05/2022=Left knee approx 8 deg from zero and right knee = 23 deg from zero  Goal status: Progressing  5. Pt. Will demo ability to stand pivot transfer with max assist for improved functional mobility and less dependent need on mechanical device.    Baseline: Dependent on hoyer lift for all transfers; 04/07/2022- Unable to test secondary to patient with recent UTI and right hand procedure - will attempt to reassess next visit. ; 04/12/22: Unable to successfully stand due to feet plates on loaned power w/c; 05/10/2022- Patient still having to use rental chair and has not received her original power w/c back to practice SPT. 07/04/2021= Patient just received new power w/c and attempted standing from 1st time today- Patient able to stand with max assist today from w/c and did not attempt to perform pivot transfer- difficulty with static standing and placing weight on right LE. 10/06/2022- Not assessed today- patient needs new AFO prior to attempting transfers  Goal status: ONGOING  6. Pt. Will demonstrate improved functional LE strength as seen by ability to stand > 2 min for improved transfer ability and pregait abilities.   Baseline: not assessed today due to recent dx: UTI; 04/07/2022- Unable to test secondary to patient with recent UTI and right hand procedure - will attempt to reassess next visit; 04/12/22: Unable to successfully stand due to feet plates on loaned power w/c 05/10/2022- Patient still having to use rental chair and has not received her original power w/c back to practice Standing. 07/05/2022- Patient able to stand with max assist at trunk today for 2 min 3 sec. Will keep goal active to ensure consistency. 10/06/2022= Unable to assess today- patient with some recent blood pressure issues and needs new AFO.   Goal status: ONGOING  ASSESSMENT:  CLINICAL IMPRESSION: Patient presented with good motivation and demonstrated much improved ability to stand and remain standing overall today. No complaint of pain- only fatigue with standing today. Some obvious fatigue with left knee but overall better this visit.  Pt will continue to benefit from skilled PT services to optimize independence and reduced caregiver support.    OBJECTIVE IMPAIRMENTS Abnormal  gait, decreased activity tolerance, decreased balance, decreased coordination, decreased endurance, decreased mobility, difficulty walking, decreased ROM, decreased strength, hypomobility, impaired flexibility, impaired UE functional use, postural dysfunction, and pain.   ACTIVITY LIMITATIONS carrying, lifting, bending, sitting, standing, squatting, sleeping, stairs, transfers, bed mobility, continence, bathing, toileting, dressing, self feeding, reach over head, hygiene/grooming, and caring for others  PARTICIPATION LIMITATIONS: meal prep, cleaning, laundry, medication management, personal finances, interpersonal relationship, driving, shopping, community activity, and yard work  PERSONAL FACTORS Age, Time since onset of injury/illness/exacerbation, and 1-2 comorbidities: HTN, cervical  Sx  are also affecting patient's functional outcome.   REHAB POTENTIAL: Good  CLINICAL DECISION MAKING: Evolving/moderate complexity  EVALUATION COMPLEXITY: Moderate  PLAN: PT FREQUENCY: 1-2x/week  PT DURATION: 12 weeks  PLANNED INTERVENTIONS: Therapeutic exercises, Therapeutic activity, Neuromuscular re-education, Balance training, Gait training, Patient/Family education, Self Care, Joint mobilization, DME instructions, Dry Needling, Electrical stimulation, Wheelchair mobility training, Spinal mobilization, Cryotherapy, Moist heat, and Manual therapy  PLAN FOR NEXT SESSION: strength, core training, motor control postural righting performance endurance   1:22 PM, 11/08/22 Louis Meckel, PT Physical Therapist - [place] [facility]  Outpatient Physical Therapy- Main Campus [phone]

## 2022-11-10 ENCOUNTER — Ambulatory Visit: Payer: Medicare HMO | Admitting: Occupational Therapy

## 2022-11-10 ENCOUNTER — Encounter: Payer: Self-pay | Admitting: Student

## 2022-11-10 ENCOUNTER — Ambulatory Visit: Payer: Medicare HMO

## 2022-11-10 ENCOUNTER — Non-Acute Institutional Stay (INDEPENDENT_AMBULATORY_CARE_PROVIDER_SITE_OTHER): Payer: Medicare HMO | Admitting: Student

## 2022-11-10 DIAGNOSIS — R2689 Other abnormalities of gait and mobility: Secondary | ICD-10-CM

## 2022-11-10 DIAGNOSIS — Z91199 Patient's noncompliance with other medical treatment and regimen due to unspecified reason: Secondary | ICD-10-CM

## 2022-11-10 DIAGNOSIS — R278 Other lack of coordination: Secondary | ICD-10-CM

## 2022-11-10 DIAGNOSIS — R2681 Unsteadiness on feet: Secondary | ICD-10-CM

## 2022-11-10 DIAGNOSIS — M6281 Muscle weakness (generalized): Secondary | ICD-10-CM | POA: Diagnosis not present

## 2022-11-10 DIAGNOSIS — S72001A Fracture of unspecified part of neck of right femur, initial encounter for closed fracture: Secondary | ICD-10-CM

## 2022-11-10 DIAGNOSIS — R262 Difficulty in walking, not elsewhere classified: Secondary | ICD-10-CM

## 2022-11-10 DIAGNOSIS — S14129S Central cord syndrome at unspecified level of cervical spinal cord, sequela: Secondary | ICD-10-CM

## 2022-11-10 DIAGNOSIS — I10 Essential (primary) hypertension: Secondary | ICD-10-CM

## 2022-11-10 DIAGNOSIS — R269 Unspecified abnormalities of gait and mobility: Secondary | ICD-10-CM

## 2022-11-10 NOTE — Therapy (Addendum)
Occupational Therapy Neuro Treatment Note   Patient Name: Sherry [name] MRN: [id number] DOB:[birth year], 88 y.o., female Today's Date: 09/20/2022                                 PCP: Dr. Tillman Abide REFERRING PROVIDER: Dr. Earnestine Mealing   OT End of Session - 11/10/22 1640     Visit Number [id number]    Number of Visits 96    Date for OT Re-Evaluation 01/19/23    Authorization Time Period Progress report period starting 0812/2024    OT Start Time 1100    OT Stop Time 1145    OT Time Calculation (min) 45 min    Activity Tolerance Patient tolerated treatment well    Behavior During Therapy [facility] for tasks assessed/performed                       Past Medical History:  Diagnosis Date   Acute blood loss anemia    Arthritis    Cancer (HCC)    skin   Central cord syndrome at C6 level of cervical spinal cord (HCC) 11/29/2017   Hypertension    Protein-calorie malnutrition, severe (HCC) 01/24/2018   S/P insertion of IVC (inferior vena caval) filter 01/24/2018   Tetraparesis (HCC)    Past Surgical History:  Procedure Laterality Date   ANTERIOR CERVICAL DECOMP/DISCECTOMY FUSION N/A 11/29/2017   Procedure: Cervical five-six, six-seven Anterior Cervical Decompression Fusion;  Surgeon: Jadene Pierini, MD;  Location: [facility] OR;  Service: Neurosurgery;  Laterality: N/A;  Cervical five-six, six-seven Anterior Cervical Decompression Fusion   CATARACT EXTRACTION     FEMUR IM NAIL Left 08/16/2021   Procedure: INTRAMEDULLARY (IM) NAIL FEMORAL;  Surgeon: Deeann Saint, MD;  Location: [facility] ORS;  Service: Orthopedics;  Laterality: Left;   IR IVC FILTER PLMT / S&I /IMG GUID/MOD SED  12/08/2017   Patient Active Problem List   Diagnosis Date Noted   Age-related osteoporosis without current pathological fracture 07/27/2022   Mild recurrent major depression (HCC) 07/27/2022   Hypertensive heart disease with other congestive heart failure (HCC) 02/14/2022   Chronic indwelling Foley  catheter 02/14/2022   Closed hip fracture (HCC) 08/15/2021   DVT (deep venous thrombosis) (HCC) 08/15/2021   Quadriplegia (HCC) 08/15/2021   Fall 08/15/2021   Constipation due to slow transit 08/31/2018   Trauma 06/05/2018   Neuropathic pain 06/05/2018   Neurogenic bowel 06/05/2018   Vaginal yeast infection 01/30/2018   Healthcare-associated pneumonia 01/25/2018   Chronic allergic rhinitis 01/24/2018   Depression with anxiety 01/24/2018   UTI due to Klebsiella species 01/24/2018   Protein-calorie malnutrition, severe (HCC) 01/24/2018   S/P insertion of IVC (inferior vena caval) filter 01/24/2018   Tetraparesis (HCC) 01/20/2018   Neurogenic bladder 01/20/2018   Reactive depression    Benign essential HTN    Acute postoperative anemia due to expected blood loss    Central cord syndrome at C6 level of cervical spinal cord (HCC) 11/29/2017   Allergy to alpha-gal 11/25/2016   SCC (squamous cell carcinoma) 04/08/2014    REFERRING DIAG: Central Cord Syndrome at C6 of the Cervical Spinal Cord, Fall with Bilateral Closed Hip Fractures, with ORIF repair with Intramedullary Nailing of the Left Hip Fracture.  THERAPY DIAG:   Muscle weakness (generalized)  Other lack of coordination  Rationale for Evaluation and Treatment Rehabilitation  PERTINENT HISTORY: Patient s/p fall November 29, 2017 resulting in diagnosis of  central cord syndrome at C 6 level.  She has had therapy in multiple venues but with recent move to [facility], therapy staff recommended she seek outpatient therapy for her needs. Pt. Had a recent fall with Bilateral Closed Hip Fractures, with ORIF repair with Intramedullary Nailing of the Left Hip Fracture.   PRECAUTIONS: None  SUBJECTIVE:   Pt. Reports there was no room in the therapy reception waiting area, or hallway this morning. PAIN: Are you having pain No  OBJECTIVE:    Today's Treatment:    Manual Therapy:   Pt. tolerated soft tissue massage to the volar,  and dorsal surface of each digit on the bilateral hands, and thenar eminence secondary to stiffness following moist heat modality. Soft tissue mobilization was performed for Carpal, and metacarpal spread stretches. Manual therapy was performed independent of, and in preparation for therapeutic Ex.   Therapeutic Ex.    Pt. tolerated PROM followed by AROM bilateral wrist extension, PROM for bilateral digit MP, PIP, and DIP flexion, and extension, thumb radial, and palmar abduction. Emphasis was placed on left wrist extension with moist heat modality 2/2 tightness. Pt. worked on [employer] using a 2# dowel for bilateral shoulder flexion, chest presses, and circular motion. Pt. Worked on pinch strengthening in the right hand for lateral, and 3pt. pinch using yellow, red, green, and blue resistive clips. Pt. worked on placing the clips at various elevated horizontal angles. Tactile and verbal cues were required for eliciting the desired movement. Pt. worked on bilateral  forearm, and wrist motion using the wrist maze for 2 trials on the right, and 1 trial on the left.        PATIENT EDUCATION: RUE Distal ROM, and strength Person educated: Patient Education method: Explanation and Demonstration Education comprehension: verbalized understanding, returned demonstration, and needs further education   HOME EXERCISE PROGRAM Continue with ongoing HEPs  Measurements:  12/09/2021:  Shoulder flexion: R: 120(136), L: 138(150) Shoulder abduction: R: 108(120), L: 110(120) Elbow: R: 0-148, L: 0-146 Wrist flexion: R: 55(62), L: 78 Writ extension: R: 34(55), L: 3(4) RD: R: 12(20), L: 16(26) UD: R: 8(24), L: 6(18) Thumb radial abduction: R: 11(20), L: 8(20) Digit flexion to the [facility]: R:  2nd: 5cm(3.5cm), 3rd: 7.5 cm(5cm), 4th: 7cm(3cm), 5th: 6cm(5.5cm) L: 2nd: 8cm(8cm), 3rd: 8cm(8cm), 4th: 7.5cm(7cm), 5th: 7cm(7cm)  02/03/2022:   Shoulder flexion: R: 122(138), L: 148(154) Shoulder abduction: R:  109(125), L: 125(125 Elbow: R: 0-148, L: 0-146 Wrist flexion: R: 60(68), L: 79 Writ extension: R: 40(55), L: 3(10) RD: R: 14(24), L: 20(28) UD: R: 8(24), L: 6(18) Thumb radial abduction: R: 20(25), L: 8(20) Digit flexion to the [facility]: R:  2nd: 4.5cm(3cm), 3rd: 6.5cm(4.5cm), 4th: 6.5 cm(3cm), 5th: 4.5cm(5cm) L: 2nd: 8cm(8cm), 3rd: 8cm(7cm), 4th: 7.5cm(7cm), 5th: 6.5cm(6.5cm)   04/07/2022:   Shoulder flexion: R: 125(150), L: 160(160) Shoulder abduction: R: 109(125), L: 125(125 Elbow: R: 0-148, L: 0-146 Wrist flexion: R: 60(68), L: 79 Writ extension: R: 40(55), L: 5(18) RD: R: 14(24), L: 20(28) UD: R: 18(24), L: 8(18) Thumb radial abduction: R: 20(25), L: 8(20) Digit flexion to the [facility]: R:  2nd: 4.5 cm(4 cm), 3rd: 6.5cm(3cm), 4th: 7 cm (5cm), 5th: 5cm(5cm) L: 2nd: 8cm(8cm), 3rd: 8cm(7cm), 4th: 7.5cm(7cm), 5th: 7cm(7cm)   07/05/2022:   Shoulder flexion: R: 125(154), L: 160(160) Shoulder abduction: R: 95(130), L: 143(143) Elbow: R: 0-148, L: 0-146 Wrist flexion: R: 60(68), L: 79 Writ extension: R: 40(60), L: 0(12) RD: R: 10(24), L: 12(20) UD: R: 16(24), L: 8(16) Thumb radial abduction:  R: 20(25), L: 8(20) Digit flexion to the [facility]: R:  2nd: 4 cm(4cm), 3rd: 6.5cm( 3.5cm), 4th: 7 cm (5cm), 5th: 6cm(5cm) L: 2nd: 8cm(8cm), 3rd: 8cm(7cm), 4th: 8cm(7cm), 5th: 7cm(7cm)  09/13/2022:   Shoulder flexion: R: 125(154), L: 160(160) Shoulder abduction: R: 100(130), L: 143(143) Elbow: R: 0-150, L: 0-150 Wrist flexion: R: 55(68), L: 79 Writ extension: R: 45(60), L: 0(12) RD: R: 10(24), L: 12(20) UD: R: 16(24), L: 8(16) Thumb radial abduction: R: 20(25), L: 8(20) Digit flexion to the [facility]: R:  2nd: 4 cm(4cm), 3rd: 6cm( 5cm), 4th: 6 cm (4cm), 5th: 5cm(5cm) L: 2nd: 7cm(cm), 3rd: 7cm(6cm), 4th: 7cm(6cm), 5th: 6cm(6cm)  10/27/2022   Shoulder flexion: R: 128(154), L: 160(160) Shoulder abduction: R: 105(130), L: 143(143) Elbow: R: 0-150, L: 0-150 Wrist flexion: R: 55(68), L: 79 Writ extension:  R: 46(64), L: 0(20) RD: R: 10(20), L: 18(24) UD: R: 118(28), L: 8(20) Thumb radial abduction: R: 30(42), L:20(24) Digit flexion to the [facility]: R:  2nd: 4 cm(3cm), 3rd: 6cm( 6cm), 4th: 6 cm (5cm), 5th: 5cm(5cm) L: 2nd: 8cm(8cm), 3rd: 8cm(6cm), 4th: 8cm(7cm), 5th: 6cm(6cm)       OT Long Term Goals - 07/06/21 1106                                                                                          Target Date: 01/19/2023        OT LONG TERM GOAL #1   Title    Baseline    Time    Period    Status      OT LONG TERM GOAL #2   Title    Baseline    Time    Period    Status                OT LONG TERM GOAL #3   Title Patient will demonstrate improved composite finger flexion to be able to firmly hold  and use adaptive devices during ADLs, and IADLs.   Baseline 10/27/2022: Pt. presents with increased MP, PIP, and DIP digit tightness/stiffness limiting the formulation of bilateral composites fists during this progress reporting period limiting. Digit flexion to the [facility]: R:  2nd: 4 cm(3cm), 3rd: 6cm( 6cm), 4th: 6 cm (5cm), 5th: 5cm(5cm), L: 2nd: 8cm(8cm), 3rd: 8cm(6cm), 4th: 8cm(7cm), 5th: 6cm(6cm) 09/20/2022: Improving digit flexion to the [facility]. 09/13/22: improved digit flexion to the [facility]. 07/05/2022: Pt. presents with digit MP, PIP, and DIP extensor tightness limiting her ability to securely grip objects in her bilateral hands.  04/07/2022: Pt. Has improved with right 2nd, and 3rd digit flexion towards the [facility]. Pt. Continues to have difficulty securely holding and applying deodorant.02/03/2022: Pt. Has improved with digit flexion, however continues to have difficulty securely holding and using deoderant. 12/09/2021: Bilateral hand/digit MP, PIP, and DIP extension tightness limits her ability achieve digit flexion to hold, and apply deodorant. 10/28/2021:  Pt. continues to have difficulty holding the deodorant. 01/12/2021: Pt. Presents with limited digit extension. Pt. Is able to initiate holding  deodorant, however is unable to hold it while using it.   Time 12    Period Weeks    Status Ongoing  Target Date                  OT LONG TERM GOAL #4   Title Pt. Will improve bilateral wrist extension in preparation for anticipating, and initiating reaching for objects at the table.    Baseline 10/27/2022: Pt. Is improving with bilateral wrist extension. R: 46(64) L: 0(20) 09/20/2022: limited PROM in the Left wrist extension 2/2 tightness/stiffness. 09/13/22: R: 55(68), L: 0(12) 07/19/2022: Bilateral wrist extension in limited. 07/05/2022: Right: 40(60) Left: 0(12) 04/07/2022: Rght: 40(55) Left: 5(18) 02/03/2022: Right: 34(55) Left: 3(4) 12/09/2021: Right  34(55)  Left  3(4) 10/28/2021:  Right 22(38), Left 0(15) 09/14/2021:  Right: 17(35), left 2(15)   Time 12   Period weeks   Status Improving/Ongoing   Target Date      OT LONG TERM GOAL #5   Title    Baseline    Time    Period    Status          OT LONG TERM GOAL  #6   TITLE    Baseline    Time    Period    Status        OT LONG TERM GOAL  #7  TITLE Pt. Will increase  bilateral lateral pinch strength by 2 lbs to be able to securely grasp items during ADLs, and IADL tasks.   Baseline 10/27/2023: Pt. Is improving with holding items with her bilateral thumbs. (Pinch meter out for calibration) 09/20/2022: lateral pinch continues to be limited 09/13/22: R 6.5# L 4# 07/19/2022: TBD 07/05/2022: TBD 07/2022: NT-Pinch meter out for calibration. 02/03/2022: Right: 6#, Left: 4# 12/09/2021:  Right: 6#, Left: 4#   Time 12  Period Weeks  Status Ongoing            OT LONG TERM GOAL  #8  TITLE   Baseline   Time   Period   Status    OT LONG TERM GOAL  #9  TITLE Pt. will complete plant care with modified independence.  Baseline 10/27/2022: Pt. Is able to water plants that are closer, and  within reach. Pt. continues to have difficulty reaching for thorough plant care. 09/20/2022: Pt. Continues to have difficulty reaching for thorough plant  care. 09/13/22: continues to report intermittent difficulty 07/19/2022: Pt. Continues to be able to water, and care for some  of her plants. Pt. Has more difficulty with plants that are harder to reach. 07/05/2022: pt. Is able to water, and care for some  of her plants. Pt. Has more difficulty with plants that are harder to reach. 04/07/2022: Pt. is now able to hold a cup and water her plants.02/03/2022: Pt. has difficulty caring for her plants.   Time 12  Period Weeks  Status Ongoing   OT LONG TERM GOAL  #10  TITLE Pt. will demonstrate adaptive techniques to assist with the efficiency of self-dressing, or morning care tasks.  Baseline 10/27/2022: Pt. is able to assist with initiating UE dressing. Pt. requires assist from personal, and staff care aides. 09/20/2022: Pt. continues to require assist form personal, and staff care aides. 09/13/22: MOD A dressing, reports she is often rushed 07/19/2022: Pt. continues to require assist with self-dressing/morning care tasks.   07/05/2022: Continue with goal.  04/07/2022: Pt. Continues to require assist with the efficiency of self-dressing, and morning care tasks.02/03/2022: Pt. requires assist from caregivers 2/2 time limitations during morning care.   Time 12  Period Weeks  Status Ongoing  OT LONG TERM GOAL  #11  Title Pt.  will improve BUE strength to improve ADL, and IADL functioning.  Baseline 10/27/2022: shoulder flexion L 4/5, R 3+/5;  shoulder abduction L 4+/5, R 3+/5. elbow flexion B 5/5, elbow extension 4/5 09/20/2022: Pt. Presents with limited BUE strength 09/13/22: shoulder flexion L 4/5, R 3/5; elbow flexion B 5/5, elbow extension 4-/5; shoulder abduction L 4+/5, R 3+/5. 07/19/2022:  BUE strength continues to be limited. 07/05/2022: shoulder flexion: right 4-/5, abduction: 3+/5, elbow flexion: right: 5/5, left 5/5, extension: right: 4-/5, left 4-/5, wrist extension: right: 3-/5, left: 2-/5  Time 12   Period Weeks   Status Ongoing  OT LONG TERM GOAL  #12 Title  Pt.  will improve  bilateral thumb radial abduction in order to be able to hold the grab bars while standing with PT  Baseline 10/27/2022:  thumb radial abduction: R: 30(42), L: 20(24)  Time 12   Period Weeks   Status New  OT LONG TERM GOAL  #13 Title Pt.  Will be able to securely hold, and stabilize medication bottles at a tabletop surface when opening, and closing them.  Baseline 10/27/2022:  Pt. Stabilizes bottles against her torso when attempting to open, and close them   Time 12   Period Weeks   Status New     Plan - 07/22/21 1737     Clinical Impression Pt. continues to tolerate manual therapy, there. Ex., and ROM well. Pt. was able to complete 3 trials of the wrist maze on the right, and 1 on the left, as Pt. had difficulty with stabilizing it in her right hand. Pt. Required cues for visual demonstration of the proper clip pinch position in the hand. Pt. was able to Pt. complete pinch strengthening with both the 3pt, and lateral pinch on the right, Pt. continues to respond well to prolonged gentle stretching of the bilateral thumb web spaces. Pt. continues to present with tightness at the bilateral thumb webspace, and bilateral wrist flexor tightness, as well as digit MP, PIP, and DIP extensor tightness which continues to limit her ability to complete ADL tasks efficiently. Pt. continues to benefit from working on impoving ROM, and UE strength in order to work towards increasing bilateral hand grasp on objects, and increasing engagement of bilateral hands during ADLs, and IADL tasks.  Plan to take measurements, and do the progress report update next visit.      OT Occupational Profile and History Detailed Assessment- Review of Records and additional review of physical, cognitive, psychosocial history related to current functional performance    Occupational performance deficits (Please refer to evaluation for details): ADL's;IADL's;Leisure;Social Participation    Body Structure / Function /  Physical Skills ADL;Continence;Dexterity;Flexibility;Strength;ROM;Balance;Coordination;FMC;IADL;Endurance;UE functional use;Decreased knowledge of use of DME;[facility]    Psychosocial Skills Coping Strategies    Rehab Potential Fair    Clinical Decision Making Several treatment options, min-mod task modification necessary    Comorbidities Affecting Occupational Performance: Presence of comorbidities impacting occupational performance    Comorbidities impacting occupational performance description: contractures of bilateral hands, dependent transfers    Modification or Assistance to Complete Evaluation  No modification of tasks or assist necessary to complete eval    OT Frequency 2x / week    OT Duration 12 weeks    OT Treatment/Interventions Self-care/ADL training;Cryotherapy;Therapeutic exercise;DME and/or AE instruction;Balance training;Neuromuscular education;Manual Therapy;Splinting;Moist Heat;Passive range of motion;Therapeutic activities;Patient/family education    Plan continue to progress ROM of digits, wrists and shoulders as it pertains to completion of ADL tasks that pt values.  Consulted and Agree with Plan of Care Patient          Olegario Messier, M.S. OTR/L  11/10/22, 4:42 PM

## 2022-11-10 NOTE — Progress Notes (Unsigned)
Location:  Other [place].  Nursing Home Room Number: [facility] 219A Place of Service:  SNF [phone]) Provider:  Earnestine Mealing, MD  Patient Care Team: Earnestine Mealing, MD as PCP - General (Family Medicine)  Extended Emergency Contact Information Primary Emergency Contact: [name],[name] Mobile Phone: [phone] Relation: Daughter Secondary Emergency Contact: Janaria, Mccammon Address: [address]          [place], [place] [address] [place] of [place] Home Phone: [phone] Mobile Phone: [phone] Relation: Spouse  Code Status:  DNR Goals of care: Advanced Directive information    11/10/2022   10:09 AM  Advanced Directives  Does Patient Have a Medical Advance Directive? Yes  Type of Estate agent of [place];Out of facility DNR (pink MOST or yellow form);Living will  Does patient want to make changes to medical advance directive? No - Patient declined  Copy of Healthcare Power of Attorney in Chart? Yes - validated most recent copy scanned in chart (See row information)     Chief Complaint  Patient presents with   Medical Management of Chronic Issues    Medical Management of Chronic Issues.     HPI:  Pt is a 88 y.o. female seen today for medical management of chronic diseases.     Past Medical History:  Diagnosis Date   Acute blood loss anemia    Arthritis    Cancer (HCC)    skin   Central cord syndrome at C6 level of cervical spinal cord (HCC) 11/29/2017   Hypertension    Protein-calorie malnutrition, severe (HCC) 01/24/2018   S/P insertion of IVC (inferior vena caval) filter 01/24/2018   Tetraparesis (HCC)    Past Surgical History:  Procedure Laterality Date   ANTERIOR CERVICAL DECOMP/DISCECTOMY FUSION N/A 11/29/2017   Procedure: Cervical five-six, six-seven Anterior Cervical Decompression Fusion;  Surgeon: Jadene Pierini, MD;  Location: [facility] OR;  Service: Neurosurgery;  Laterality: N/A;  Cervical five-six, six-seven  Anterior Cervical Decompression Fusion   CATARACT EXTRACTION     FEMUR IM NAIL Left 08/16/2021   Procedure: INTRAMEDULLARY (IM) NAIL FEMORAL;  Surgeon: Deeann Saint, MD;  Location: [facility] ORS;  Service: Orthopedics;  Laterality: Left;   IR IVC FILTER PLMT / S&I /IMG GUID/MOD SED  12/08/2017    Allergies  Allergen Reactions   Other Swelling   Lisinopril-Hydrochlorothiazide Swelling    Swelling of the tongue   Beef-Derived Products Swelling    Facial and lip swelling (due to tick bite)   Dairy Aid [Tilactase] Swelling   Lisinopril-Hydrochlorothiazide Swelling    Tongue swelling   Pork-Derived Products Swelling    Outpatient Encounter Medications as of 11/10/2022  Medication Sig   acetaminophen (TYLENOL) 500 MG tablet Take 500 mg by mouth every 12 (twelve) hours as needed for mild pain.   acetaminophen (TYLENOL) 650 MG CR tablet Take 650 mg by mouth every 8 (eight) hours as needed for pain.   acetic acid 0.25 % irrigation Use 30ml via irrigation twice daily to keep foley Cath patent.   alendronate (FOSAMAX) 70 MG tablet Take 70 mg by mouth every Saturday.   calcium carbonate (CALCIUM 600) 600 MG TABS tablet Take 600 mg by mouth at bedtime.   Capsaicin 0.1 % CREA Apply 1 application  topically every evening. (Apply to hands)   cetirizine (ZYRTEC) 5 MG tablet Take 5 mg by mouth 2 (two) times daily.   cholecalciferol (VITAMIN D3) 25 MCG (1000 UNIT) tablet Take 1,000 Units by mouth daily.   DULoxetine (CYMBALTA) 60 MG  capsule Take 60 mg by mouth daily.   ferrous sulfate 325 (65 FE) MG tablet Take 325 mg by mouth daily with breakfast.   fluocinonide (LIDEX) 0.05 % external solution Apply 1 Application topically every 12 (twelve) hours as needed.   gabapentin (NEURONTIN) 100 MG capsule Take 1 capsule (100 mg total) by mouth daily.   hydroxypropyl methylcellulose / hypromellose (ISOPTO TEARS / GONIOVISC) 2.5 % ophthalmic solution Place 1 drop into both eyes every hour as needed for dry eyes.    Infant Care Products [facility]) OINT Apply to buttocks/gluteal folds topically every shift.   ketoconazole (NIZORAL) 2 % shampoo Apply 1 Application topically 2 (two) times a week.   lactulose, encephalopathy, (CHRONULAC) 10 GM/15ML SOLN Take 30 mLs by mouth daily as needed (mild constipation).   magnesium citrate solution Take 60-120 mLs by mouth daily as needed (constipation).   methylcellulose oral powder Take 1 packet by mouth at bedtime.   mometasone (ELOCON) 0.1 % lotion Apply 1 Application topically every Saturday at 6 PM. (Apply to the ear canal for eczema)   Multiple Vitamin (MULTIVITAMIN WITH MINERALS) TABS tablet Take 1 tablet by mouth daily.   olopatadine (PATADAY) 0.1 % ophthalmic solution Place 1 drop into both eyes 2 (two) times daily.   polyethylene glycol (MIRALAX / GLYCOLAX) 17 g packet Take 17 g by mouth 2 (two) times daily.   rivaroxaban (XARELTO) 10 MG TABS tablet Take 10 mg by mouth daily.   saccharomyces boulardii (FLORASTOR) 250 MG capsule Take 250 mg by mouth daily.   Saline Spray 0.2 % SOLN Place 2 sprays into both nostrils as needed (nasal congestion).   simethicone (MYLICON) 80 MG chewable tablet Chew 80 mg by mouth every 6 (six) hours as needed for flatulence.   sodium phosphate (FLEET) 7-19 GM/118ML ENEM Place 1 enema rectally every other day as needed for severe constipation.   spironolactone (ALDACTONE) 25 MG tablet Take 0.5 tablets (12.5 mg total) by mouth daily.   tiZANidine (ZANAFLEX) 2 MG tablet Take 1 tablet (2 mg total) by mouth 2 (two) times daily.   triamcinolone cream (KENALOG) 0.1 % Apply to thigh and lower legs topically every 8 hours as needed. Apply to back of neck topically as needed.   Zinc Oxide (TRIPLE PASTE) 12.8 % ointment Apply 1 Application topically. Every shift.   No facility-administered encounter medications on file as of 11/10/2022.    Review of Systems  Immunization History  Administered Date(s) Administered   Influenza, High Dose  Seasonal PF 11/17/2021   Influenza-Unspecified 11/14/2018, 11/20/2019, 11/17/2020, 11/17/2021   Moderna Covid-19 Fall Seasonal Vaccine 73yrs & older 05/11/2022   Moderna Covid-19 Vaccine Bivalent Booster 8yrs & up 06/30/2021, 12/11/2021, 10/29/2022   Moderna Sars-Covid-2 Vaccination 02/09/2019, 03/09/2019, 12/14/2019, 06/20/2020, 10/24/2020   PPD Test 05/16/2019   Tdap 11/29/2017   Zoster Recombinant(Shingrix) 09/02/2022   Pertinent  Health Maintenance Due  Topic Date Due   INFLUENZA VACCINE  09/02/2022   DEXA SCAN  Discontinued      08/19/2021   10:00 AM 08/19/2021    8:54 PM 08/20/2021    8:20 AM 03/29/2022   10:11 AM 08/31/2022    2:42 PM  Fall Risk  Falls in the past year?    0 0  Was there an injury with Fall?    0   Fall Risk Category Calculator    0   (RETIRED) Patient Fall Risk Level High fall risk High fall risk High fall risk    Patient at Risk for  Falls Due to    No Fall Risks    Functional Status Survey:    Vitals:   11/10/22 1001  BP: 118/70  Pulse: 78  Resp: 18  Temp: (!) 97.5 F (36.4 C)  SpO2: 95%  Weight: 179 lb (81.2 kg)  Height: 5\' 7"  (1.702 m)   Body mass index is 28.04 kg/m. Physical Exam  Labs reviewed: Recent Labs    04/21/22 1200 06/02/22 0000 09/29/22 0000 11/04/22 0000  NA 133* 140 139 139  K 3.8 4.6 3.8 3.8  CL 100 102 100 100  CO2 30 31* 29* 32*  GLUCOSE 108*  --   --   --   BUN 8 13 10 9   CREATININE 0.68 0.5 0.5 0.5  CALCIUM 8.0* 9.0 8.6* 8.8   Recent Labs    02/08/22 0000 09/29/22 0000  AST 18 20  ALT 14 14  ALKPHOS 65 58  ALBUMIN 3.7 3.6   Recent Labs    03/08/22 0000 04/21/22 1200 04/21/22 1731 04/26/22 0000 05/03/22 0000 05/10/22 0000 05/17/22 0000 09/30/22 0000  WBC 6.2 7.1  --  5.7  --   --   --  4.7  NEUTROABS 3,683.00  --   --  3,283.00  --   --   --  2,627.00  HGB 11.9* 8.5*   < > 8.3*   < > 10.0* 10.7* 13.2  HCT 35* 26.6*   < > 26*   < > 31* 33* 41  MCV  --  99.3  --   --   --   --   --   --   PLT  360 348  --  444*  --   --   --  280   < > = values in this interval not displayed.   Lab Results  Component Value Date   TSH 1.31 09/29/2022   No results found for: "HGBA1C" No results found for: "CHOL", "HDL", "LDLCALC", "LDLDIRECT", "TRIG", "CHOLHDL"  Significant Diagnostic Results in last 30 days:  No results found.  Assessment/Plan There are no diagnoses linked to this encounter.   Family/ staff Communication: ***  Labs/tests ordered:  ***

## 2022-11-11 NOTE — Therapy (Signed)
OUTPATIENT PHYSICAL THERAPY NEURO TREATMENT  Patient Name: Sherry [name] MRN: [id number] DOB:[birth year], 88 y.o., female Today's Date: 11/11/2022   PCP: Earnestine Mealing, MD REFERRING PROVIDER: Deeann Saint   PT End of Session - 11/10/22 2204     Visit Number 77    Number of Visits 92    Date for PT Re-Evaluation 12/29/22    Progress Note Due on Visit 80    PT Start Time 1146    PT Stop Time 1220    PT Time Calculation (min) 34 min    Equipment Utilized During Treatment Gait belt    Activity Tolerance Patient tolerated treatment well;No increased pain;Patient limited by fatigue    Behavior During Therapy [facility] for tasks assessed/performed                 Past Medical History:  Diagnosis Date   Acute blood loss anemia    Arthritis    Cancer (HCC)    skin   Central cord syndrome at C6 level of cervical spinal cord (HCC) 11/29/2017   Hypertension    Protein-calorie malnutrition, severe (HCC) 01/24/2018   S/P insertion of IVC (inferior vena caval) filter 01/24/2018   Tetraparesis (HCC)    Past Surgical History:  Procedure Laterality Date   ANTERIOR CERVICAL DECOMP/DISCECTOMY FUSION N/A 11/29/2017   Procedure: Cervical five-six, six-seven Anterior Cervical Decompression Fusion;  Surgeon: Jadene Pierini, MD;  Location: [facility] OR;  Service: Neurosurgery;  Laterality: N/A;  Cervical five-six, six-seven Anterior Cervical Decompression Fusion   CATARACT EXTRACTION     FEMUR IM NAIL Left 08/16/2021   Procedure: INTRAMEDULLARY (IM) NAIL FEMORAL;  Surgeon: Deeann Saint, MD;  Location: [facility] ORS;  Service: Orthopedics;  Laterality: Left;   IR IVC FILTER PLMT / S&I /IMG GUID/MOD SED  12/08/2017   Patient Active Problem List   Diagnosis Date Noted   Age-related osteoporosis without current pathological fracture 07/27/2022   Mild recurrent major depression (HCC) 07/27/2022   Hypertensive heart disease with other congestive heart failure (HCC) 02/14/2022   Chronic  indwelling Foley catheter 02/14/2022   Closed hip fracture (HCC) 08/15/2021   DVT (deep venous thrombosis) (HCC) 08/15/2021   Quadriplegia (HCC) 08/15/2021   Fall 08/15/2021   Constipation due to slow transit 08/31/2018   Trauma 06/05/2018   Neuropathic pain 06/05/2018   Neurogenic bowel 06/05/2018   Vaginal yeast infection 01/30/2018   Healthcare-associated pneumonia 01/25/2018   Chronic allergic rhinitis 01/24/2018   Depression with anxiety 01/24/2018   UTI due to Klebsiella species 01/24/2018   Protein-calorie malnutrition, severe (HCC) 01/24/2018   S/P insertion of IVC (inferior vena caval) filter 01/24/2018   Tetraparesis (HCC) 01/20/2018   Neurogenic bladder 01/20/2018   Reactive depression    Benign essential HTN    Acute postoperative anemia due to expected blood loss    Central cord syndrome at C6 level of cervical spinal cord (HCC) 11/29/2017   Allergy to alpha-gal 11/25/2016   SCC (squamous cell carcinoma) 04/08/2014    ONSET DATE: 08/15/2021 (fall with B hip fx); Initial injury was in 2019- quadraplegia due to central cord syndrome of C6 secondary to neck fracture.   REFERRING DIAG: Bilateral Hip fx; Left Open- s/p Left IM nail femoral on 08/16/2021; Right closed  THERAPY DIAG:  Benign essential HTN  Muscle weakness (generalized)  Central cord syndrome, sequela (HCC)  Abnormality of gait and mobility  Difficulty in walking, not elsewhere classified  Closed fracture of both hips, initial encounter (HCC)  Unsteadiness on feet  Other  lack of coordination  Other abnormalities of gait and mobility  Rationale for Evaluation and Treatment Rehabilitation  SUBJECTIVE:   SUBJECTIVE STATEMENT:  Patient reports no changes since last visit.    Pt accompanied by:  Paid caregiver-   PERTINENT HISTORY: Ingri [name] is an 87yoF who experienced a fall at home on 08/15/2021 with hip fracture, s/p Left hip ORIF. PMH: quadriplegia due to central cord syndrome of C6  secondary to neck fracture, HTN, depression with anxiety, neurogenic bladder, bilateral DVT on Xarelto (s/p of IVC filter placement). Prior history of significant PT/OT with improvement of function. Pt has full use of shoulder/elbows, limited use of hands, adaptive self feeding sucessful.     PAIN:  None current but does endorse some left thigh pain intermittent  OBJECTIVE:    TODAY'S TREATMENT:  11/10/2022    THEREX:   LAQ (AROM) 2 sets of 15 rep each LE Seated hip flex (AROM on left  and AAROM on right) 2x 15 each LE Prolonged knee flex stretch (left) hold 60 sec x 3 Seated hip add with ball squeeze- hold 3 sec x 10 Seated hip abd with manual resistance x 15 reps each LE Seated ham curl with yTB x 15 reps each LE Reclined LE leg press (manual resistance) x 15 reps each LE  PATIENT EDUCATION: Education details: cues for isolated joint exercises  HOME EXERCISE PROGRAM: No changes at this time  Access Code: [facility] URL: [email or web address] Date: 11/23/2021 Prepared by: Precious Bard Exercises - Seated Gluteal Sets - 2 x daily - 7 x weekly - 2 sets - 10 reps - 5 hold - Seated Quad Set - 2 x daily - 7 x weekly - 2 sets - 10 reps - 5 hold - Seated Long Arc Quad - 2 x daily - 7 x weekly - 2 sets - 10 reps - 5 hold - Seated March - 2 x daily - 7 x weekly - 2 sets - 10 reps - 5 hold - Seated Hip Abduction - 2 x daily - 7 x weekly - 2 sets - 10 reps - 5 hold - Seated Shoulder Shrugs - 2 x daily - 7 x weekly - 2 sets - 10 reps - 5 hold - Wheelchair Pressure Relief - 2 x daily - 7 x weekly - 2 sets - 10 reps - 5 hold  GOALS: Goals reviewed with patient? Yes  SHORT TERM GOALS: Target date: 02/22/2022  Pt will be independent with initial UE strengthening HEP in order to improve strength and balance in order to decrease fall risk and improve function at home and work. Baseline: 10/19/2021- patient with no formal UE HEP; 12/02/2021= Patient verbalized knowledge of HEP including use  of theraband for UE strengthening.  Goal status: GOAL MET  LONG TERM GOALS: Target date: 12/29/2022  Pt will be independent with final for UE/LE HEP in order to improve strength and balance in order to decrease fall risk and improve function at home and work. Baseline: Patient is currently BLE NWB and unable to participate in HEP. Has order for UE strengthening. 01/11/2022- Patient now able to participate in LE strengthening although NWB still- good understanding for some basic exercises. Will keep goal active to incorporate progressive LE strengthening exercises. 04/07/2022= Patient still participating in progressive LE seated HEP and has no questions at this time.  Goal status: MET  2.  Pt will improve FOTO to target score of 35  to display perceived improvements in ability to complete ADL's.  Baseline:  10/19/2021= 12; 12/02/2021= 17; 01/11/2022=17; 04/07/2022= 17; 07/05/2022=17; 10/06/2022- outcome measure not appropriate as patient in non-ambulatory and not consistently standing. Goal status: Goal not appropriate  3.  Pt will increase strength of B UE  by at least 1/2 MMT grade in order to demonstrate improvement in strength and function  Baseline: patient has range of 2-/5 to 4/5 BUE Strength; 12/02/2021= 4/5 except with wrist ext. 01/11/2022= 4/5 B UE strength expept for wrist Goal status: Goal revised-no longer appropriate- working with OT on all UE strengthening.   4. Pt. Will increase strength of RLE by at least 1/2 MMT grade in order to demonstrate improvement in Standing/transfers.  Baseline: 2-/5 Right hip flex/knee ext/flex; 04/07/2022= 2- with right hip flex/knee ext (lacking 28 deg from zero) 4/8= Patient able to ext right knee lacking 18 deg from zero. 07/05/2022=Left knee approx 8 deg from zero and right knee = 23 deg from zero  Goal status: Progressing  5. Pt. Will demo ability to stand pivot transfer with max assist for improved functional mobility and less dependent need on mechanical  device.   Baseline: Dependent on hoyer lift for all transfers; 04/07/2022- Unable to test secondary to patient with recent UTI and right hand procedure - will attempt to reassess next visit. ; 04/12/22: Unable to successfully stand due to feet plates on loaned power w/c; 05/10/2022- Patient still having to use rental chair and has not received her original power w/c back to practice SPT. 07/04/2021= Patient just received new power w/c and attempted standing from 1st time today- Patient able to stand with max assist today from w/c and did not attempt to perform pivot transfer- difficulty with static standing and placing weight on right LE. 10/06/2022- Not assessed today- patient needs new AFO prior to attempting transfers  Goal status: ONGOING  6. Pt. Will demonstrate improved functional LE strength as seen by ability to stand > 2 min for improved transfer ability and pregait abilities.   Baseline: not assessed today due to recent dx: UTI; 04/07/2022- Unable to test secondary to patient with recent UTI and right hand procedure - will attempt to reassess next visit; 04/12/22: Unable to successfully stand due to feet plates on loaned power w/c 05/10/2022- Patient still having to use rental chair and has not received her original power w/c back to practice Standing. 07/05/2022- Patient able to stand with max assist at trunk today for 2 min 3 sec. Will keep goal active to ensure consistency. 10/06/2022= Unable to assess today- patient with some recent blood pressure issues and needs new AFO.   Goal status: ONGOING  ASSESSMENT:  CLINICAL IMPRESSION: Treatment today focused on LE Strengthening - patient demo improved right knee ext vs. Some previous visits- less assist with hip flex today as well. She is still limited by general weakness and fatigue but able to demo progress either in volume or resistance of exercises.  Pt will continue to benefit from skilled PT services to optimize independence and reduced caregiver support.     OBJECTIVE IMPAIRMENTS Abnormal gait, decreased activity tolerance, decreased balance, decreased coordination, decreased endurance, decreased mobility, difficulty walking, decreased ROM, decreased strength, hypomobility, impaired flexibility, impaired UE functional use, postural dysfunction, and pain.   ACTIVITY LIMITATIONS carrying, lifting, bending, sitting, standing, squatting, sleeping, stairs, transfers, bed mobility, continence, bathing, toileting, dressing, self feeding, reach over head, hygiene/grooming, and caring for others  PARTICIPATION LIMITATIONS: meal prep, cleaning, laundry, medication management, personal finances, interpersonal relationship, driving, shopping, community activity, and yard work  PERSONAL FACTORS  Age, Time since onset of injury/illness/exacerbation, and 1-2 comorbidities: HTN, cervical Sx  are also affecting patient's functional outcome.   REHAB POTENTIAL: Good  CLINICAL DECISION MAKING: Evolving/moderate complexity  EVALUATION COMPLEXITY: Moderate  PLAN: PT FREQUENCY: 1-2x/week  PT DURATION: 12 weeks  PLANNED INTERVENTIONS: Therapeutic exercises, Therapeutic activity, Neuromuscular re-education, Balance training, Gait training, Patient/Family education, Self Care, Joint mobilization, DME instructions, Dry Needling, Electrical stimulation, Wheelchair mobility training, Spinal mobilization, Cryotherapy, Moist heat, and Manual therapy  PLAN FOR NEXT SESSION: strength, core training, motor control postural righting performance endurance   10:06 PM, 11/11/22 Louis Meckel, PT Physical Therapist - [facility]  Outpatient Physical Therapy- Main Campus [phone]

## 2022-11-15 ENCOUNTER — Ambulatory Visit: Payer: Medicare HMO | Admitting: Occupational Therapy

## 2022-11-15 ENCOUNTER — Ambulatory Visit: Payer: Medicare HMO

## 2022-11-15 DIAGNOSIS — R262 Difficulty in walking, not elsewhere classified: Secondary | ICD-10-CM

## 2022-11-15 DIAGNOSIS — M6281 Muscle weakness (generalized): Secondary | ICD-10-CM

## 2022-11-15 DIAGNOSIS — R2681 Unsteadiness on feet: Secondary | ICD-10-CM

## 2022-11-15 DIAGNOSIS — R269 Unspecified abnormalities of gait and mobility: Secondary | ICD-10-CM

## 2022-11-15 DIAGNOSIS — S14129S Central cord syndrome at unspecified level of cervical spinal cord, sequela: Secondary | ICD-10-CM

## 2022-11-15 DIAGNOSIS — R278 Other lack of coordination: Secondary | ICD-10-CM

## 2022-11-15 DIAGNOSIS — S72001A Fracture of unspecified part of neck of right femur, initial encounter for closed fracture: Secondary | ICD-10-CM

## 2022-11-15 NOTE — Therapy (Signed)
OUTPATIENT PHYSICAL THERAPY NEURO TREATMENT  Patient Name: Sherry [name] MRN: [id number] DOB:11-Nov-1934, 88 y.o., female Today's Date: 11/15/2022   PCP: Earnestine Mealing, MD REFERRING PROVIDER: Deeann Saint   PT End of Session - 11/15/22 1142     Visit Number 78    Number of Visits 92    Date for PT Re-Evaluation 12/29/22    Progress Note Due on Visit 80    PT Start Time 1147    PT Stop Time 1220    PT Time Calculation (min) 33 min    Equipment Utilized During Treatment Gait belt    Activity Tolerance Patient tolerated treatment well;No increased pain;Patient limited by fatigue    Behavior During Therapy [facility] for tasks assessed/performed                  Past Medical History:  Diagnosis Date   Acute blood loss anemia    Arthritis    Cancer (HCC)    skin   Central cord syndrome at C6 level of cervical spinal cord (HCC) 11/29/2017   Hypertension    Protein-calorie malnutrition, severe (HCC) 01/24/2018   S/P insertion of IVC (inferior vena caval) filter 01/24/2018   Tetraparesis (HCC)    Past Surgical History:  Procedure Laterality Date   ANTERIOR CERVICAL DECOMP/DISCECTOMY FUSION N/A 11/29/2017   Procedure: Cervical five-six, six-seven Anterior Cervical Decompression Fusion;  Surgeon: Jadene Pierini, MD;  Location: [facility] OR;  Service: Neurosurgery;  Laterality: N/A;  Cervical five-six, six-seven Anterior Cervical Decompression Fusion   CATARACT EXTRACTION     FEMUR IM NAIL Left 08/16/2021   Procedure: INTRAMEDULLARY (IM) NAIL FEMORAL;  Surgeon: Deeann Saint, MD;  Location: [facility] ORS;  Service: Orthopedics;  Laterality: Left;   IR IVC FILTER PLMT / S&I /IMG GUID/MOD SED  12/08/2017   Patient Active Problem List   Diagnosis Date Noted   Age-related osteoporosis without current pathological fracture 07/27/2022   Mild recurrent major depression (HCC) 07/27/2022   Hypertensive heart disease with other congestive heart failure (HCC) 02/14/2022   Chronic  indwelling Foley catheter 02/14/2022   Closed hip fracture (HCC) 08/15/2021   DVT (deep venous thrombosis) (HCC) 08/15/2021   Quadriplegia (HCC) 08/15/2021   Fall 08/15/2021   Constipation due to slow transit 08/31/2018   Trauma 06/05/2018   Neuropathic pain 06/05/2018   Neurogenic bowel 06/05/2018   Vaginal yeast infection 01/30/2018   Healthcare-associated pneumonia 01/25/2018   Chronic allergic rhinitis 01/24/2018   Depression with anxiety 01/24/2018   UTI due to Klebsiella species 01/24/2018   Protein-calorie malnutrition, severe (HCC) 01/24/2018   S/P insertion of IVC (inferior vena caval) filter 01/24/2018   Tetraparesis (HCC) 01/20/2018   Neurogenic bladder 01/20/2018   Reactive depression    Benign essential HTN    Acute postoperative anemia due to expected blood loss    Central cord syndrome at C6 level of cervical spinal cord (HCC) 11/29/2017   Allergy to alpha-gal 11/25/2016   SCC (squamous cell carcinoma) 04/08/2014    ONSET DATE: 08/15/2021 (fall with B hip fx); Initial injury was in 2019- quadraplegia due to central cord syndrome of C6 secondary to neck fracture.   REFERRING DIAG: Bilateral Hip fx; Left Open- s/p Left IM nail femoral on 08/16/2021; Right closed  THERAPY DIAG:  Muscle weakness (generalized)  Central cord syndrome, sequela (HCC)  Abnormality of gait and mobility  Difficulty in walking, not elsewhere classified  Closed fracture of both hips, initial encounter (HCC)  Unsteadiness on feet  Other lack of coordination  Rationale for Evaluation and Treatment Rehabilitation  SUBJECTIVE:   SUBJECTIVE STATEMENT:  Patient reports very tired today and that she has not slept well over the weekend.     Pt accompanied by:  Paid caregiver-   PERTINENT HISTORY: Sherry [name] is an 87yoF who experienced a fall at home on 08/15/2021 with hip fracture, s/p Left hip ORIF. PMH: quadriplegia due to central cord syndrome of C6 secondary to neck fracture,  HTN, depression with anxiety, neurogenic bladder, bilateral DVT on Xarelto (s/p of IVC filter placement). Prior history of significant PT/OT with improvement of function. Pt has full use of shoulder/elbows, limited use of hands, adaptive self feeding sucessful.     PAIN:  None current but does endorse some left thigh pain intermittent  OBJECTIVE:    TODAY'S TREATMENT:  11/10/2022    THEREX:   Seated hip flex (AROM on left  and AAROM on right) 2x 15 each LE Prolonged knee flex stretch (left) hold 60 sec x 3 Seated quad set with 5 sec hold x 10 reps each LE Seated ham curl with yTB x 15 reps each LE  Therapeutic activities:   Sit to stand  x multiple attempts- Patient attempted on her own- able to raises bottom slightly off seat of chair yet unable to rise without Physical assist- max A yet able to rise easier and without knee buckling.  Static stand x 3 trials- at most 3 min 4 sec with improved anterior weight shift and decreased left knee hyperextension. Patient struggled on last trial- only able to hold for approx 44 sec and stated just too fatigued to continue.       PATIENT EDUCATION: Education details: cues for isolated joint exercises  HOME EXERCISE PROGRAM: No changes at this time  Access Code: [facility] URL: [email or web address] Date: 11/23/2021 Prepared by: Precious Bard Exercises - Seated Gluteal Sets - 2 x daily - 7 x weekly - 2 sets - 10 reps - 5 hold - Seated Quad Set - 2 x daily - 7 x weekly - 2 sets - 10 reps - 5 hold - Seated Long Arc Quad - 2 x daily - 7 x weekly - 2 sets - 10 reps - 5 hold - Seated March - 2 x daily - 7 x weekly - 2 sets - 10 reps - 5 hold - Seated Hip Abduction - 2 x daily - 7 x weekly - 2 sets - 10 reps - 5 hold - Seated Shoulder Shrugs - 2 x daily - 7 x weekly - 2 sets - 10 reps - 5 hold - Wheelchair Pressure Relief - 2 x daily - 7 x weekly - 2 sets - 10 reps - 5 hold  GOALS: Goals reviewed with patient? Yes  SHORT TERM GOALS:  Target date: 02/22/2022  Pt will be independent with initial UE strengthening HEP in order to improve strength and balance in order to decrease fall risk and improve function at home and work. Baseline: 10/19/2021- patient with no formal UE HEP; 12/02/2021= Patient verbalized knowledge of HEP including use of theraband for UE strengthening.  Goal status: GOAL MET  LONG TERM GOALS: Target date: 12/29/2022  Pt will be independent with final for UE/LE HEP in order to improve strength and balance in order to decrease fall risk and improve function at home and work. Baseline: Patient is currently BLE NWB and unable to participate in HEP. Has order for UE strengthening. 01/11/2022- Patient now able to participate in LE strengthening although NWB still- good  understanding for some basic exercises. Will keep goal active to incorporate progressive LE strengthening exercises. 04/07/2022= Patient still participating in progressive LE seated HEP and has no questions at this time.  Goal status: MET  2.  Pt will improve FOTO to target score of 35  to display perceived improvements in ability to complete ADL's.  Baseline: 10/19/2021= 12; 12/02/2021= 17; 01/11/2022=17; 04/07/2022= 17; 07/05/2022=17; 10/06/2022- outcome measure not appropriate as patient in non-ambulatory and not consistently standing. Goal status: Goal not appropriate  3.  Pt will increase strength of B UE  by at least 1/2 MMT grade in order to demonstrate improvement in strength and function  Baseline: patient has range of 2-/5 to 4/5 BUE Strength; 12/02/2021= 4/5 except with wrist ext. 01/11/2022= 4/5 B UE strength expept for wrist Goal status: Goal revised-no longer appropriate- working with OT on all UE strengthening.   4. Pt. Will increase strength of RLE by at least 1/2 MMT grade in order to demonstrate improvement in Standing/transfers.  Baseline: 2-/5 Right hip flex/knee ext/flex; 04/07/2022= 2- with right hip flex/knee ext (lacking 28 deg from zero)  4/8= Patient able to ext right knee lacking 18 deg from zero. 07/05/2022=Left knee approx 8 deg from zero and right knee = 23 deg from zero  Goal status: Progressing  5. Pt. Will demo ability to stand pivot transfer with max assist for improved functional mobility and less dependent need on mechanical device.   Baseline: Dependent on hoyer lift for all transfers; 04/07/2022- Unable to test secondary to patient with recent UTI and right hand procedure - will attempt to reassess next visit. ; 04/12/22: Unable to successfully stand due to feet plates on loaned power w/c; 05/10/2022- Patient still having to use rental chair and has not received her original power w/c back to practice SPT. 07/04/2021= Patient just received new power w/c and attempted standing from 1st time today- Patient able to stand with max assist today from w/c and did not attempt to perform pivot transfer- difficulty with static standing and placing weight on right LE. 10/06/2022- Not assessed today- patient needs new AFO prior to attempting transfers  Goal status: ONGOING  6. Pt. Will demonstrate improved functional LE strength as seen by ability to stand > 2 min for improved transfer ability and pregait abilities.   Baseline: not assessed today due to recent dx: UTI; 04/07/2022- Unable to test secondary to patient with recent UTI and right hand procedure - will attempt to reassess next visit; 04/12/22: Unable to successfully stand due to feet plates on loaned power w/c 05/10/2022- Patient still having to use rental chair and has not received her original power w/c back to practice Standing. 07/05/2022- Patient able to stand with max assist at trunk today for 2 min 3 sec. Will keep goal active to ensure consistency. 10/06/2022= Unable to assess today- patient with some recent blood pressure issues and needs new AFO.   Goal status: ONGOING  ASSESSMENT:  CLINICAL IMPRESSION: Treatment limited secondary to patient exhibiting fatigue and reporting not  sleeping well. She actually performed her most complete stand in a long time today- meaning better posture and weight shift forward with less physical assist to remain in standing once stabilized. She was just too fatigued to complete more standing today.  Pt will continue to benefit from skilled PT services to optimize independence and reduced caregiver support.    OBJECTIVE IMPAIRMENTS Abnormal gait, decreased activity tolerance, decreased balance, decreased coordination, decreased endurance, decreased mobility, difficulty walking, decreased ROM, decreased  strength, hypomobility, impaired flexibility, impaired UE functional use, postural dysfunction, and pain.   ACTIVITY LIMITATIONS carrying, lifting, bending, sitting, standing, squatting, sleeping, stairs, transfers, bed mobility, continence, bathing, toileting, dressing, self feeding, reach over head, hygiene/grooming, and caring for others  PARTICIPATION LIMITATIONS: meal prep, cleaning, laundry, medication management, personal finances, interpersonal relationship, driving, shopping, community activity, and yard work  PERSONAL FACTORS Age, Time since onset of injury/illness/exacerbation, and 1-2 comorbidities: HTN, cervical Sx  are also affecting patient's functional outcome.   REHAB POTENTIAL: Good  CLINICAL DECISION MAKING: Evolving/moderate complexity  EVALUATION COMPLEXITY: Moderate  PLAN: PT FREQUENCY: 1-2x/week  PT DURATION: 12 weeks  PLANNED INTERVENTIONS: Therapeutic exercises, Therapeutic activity, Neuromuscular re-education, Balance training, Gait training, Patient/Family education, Self Care, Joint mobilization, DME instructions, Dry Needling, Electrical stimulation, Wheelchair mobility training, Spinal mobilization, Cryotherapy, Moist heat, and Manual therapy  PLAN FOR NEXT SESSION: strength, core training, motor control postural righting performance endurance   1:40 PM, 11/15/22 Louis Meckel, PT Physical Therapist -  [facility]  Outpatient Physical Therapy- Main Campus [phone]

## 2022-11-15 NOTE — Therapy (Addendum)
Occupational Therapy Neuro Treatment Note   Patient Name: Sherry [name] MRN: [id number] DOB:19-Dec-1934, 88 y.o., female Today's Date: 09/20/2022                                 PCP: Dr. Tillman Abide REFERRING PROVIDER: Dr. Earnestine Mealing   OT End of Session - 11/15/22 1347     Visit Number [id number]    Number of Visits 96    Date for OT Re-Evaluation 01/19/23    Authorization Time Period Progress report period starting 0812/2024    OT Start Time 1100    OT Stop Time 1145    OT Time Calculation (min) 45 min    Activity Tolerance Patient tolerated treatment well    Behavior During Therapy [facility] for tasks assessed/performed                       Past Medical History:  Diagnosis Date   Acute blood loss anemia    Arthritis    Cancer (HCC)    skin   Central cord syndrome at C6 level of cervical spinal cord (HCC) 11/29/2017   Hypertension    Protein-calorie malnutrition, severe (HCC) 01/24/2018   S/P insertion of IVC (inferior vena caval) filter 01/24/2018   Tetraparesis (HCC)    Past Surgical History:  Procedure Laterality Date   ANTERIOR CERVICAL DECOMP/DISCECTOMY FUSION N/A 11/29/2017   Procedure: Cervical five-six, six-seven Anterior Cervical Decompression Fusion;  Surgeon: Jadene Pierini, MD;  Location: [facility] OR;  Service: Neurosurgery;  Laterality: N/A;  Cervical five-six, six-seven Anterior Cervical Decompression Fusion   CATARACT EXTRACTION     FEMUR IM NAIL Left 08/16/2021   Procedure: INTRAMEDULLARY (IM) NAIL FEMORAL;  Surgeon: Deeann Saint, MD;  Location: [facility] ORS;  Service: Orthopedics;  Laterality: Left;   IR IVC FILTER PLMT / S&I /IMG GUID/MOD SED  12/08/2017   Patient Active Problem List   Diagnosis Date Noted   Age-related osteoporosis without current pathological fracture 07/27/2022   Mild recurrent major depression (HCC) 07/27/2022   Hypertensive heart disease with other congestive heart failure (HCC) 02/14/2022   Chronic indwelling Foley  catheter 02/14/2022   Closed hip fracture (HCC) 08/15/2021   DVT (deep venous thrombosis) (HCC) 08/15/2021   Quadriplegia (HCC) 08/15/2021   Fall 08/15/2021   Constipation due to slow transit 08/31/2018   Trauma 06/05/2018   Neuropathic pain 06/05/2018   Neurogenic bowel 06/05/2018   Vaginal yeast infection 01/30/2018   Healthcare-associated pneumonia 01/25/2018   Chronic allergic rhinitis 01/24/2018   Depression with anxiety 01/24/2018   UTI due to Klebsiella species 01/24/2018   Protein-calorie malnutrition, severe (HCC) 01/24/2018   S/P insertion of IVC (inferior vena caval) filter 01/24/2018   Tetraparesis (HCC) 01/20/2018   Neurogenic bladder 01/20/2018   Reactive depression    Benign essential HTN    Acute postoperative anemia due to expected blood loss    Central cord syndrome at C6 level of cervical spinal cord (HCC) 11/29/2017   Allergy to alpha-gal 11/25/2016   SCC (squamous cell carcinoma) 04/08/2014    REFERRING DIAG: Central Cord Syndrome at C6 of the Cervical Spinal Cord, Fall with Bilateral Closed Hip Fractures, with ORIF repair with Intramedullary Nailing of the Left Hip Fracture.  THERAPY DIAG:   Muscle weakness (generalized)  Other lack of coordination  Rationale for Evaluation and Treatment Rehabilitation  PERTINENT HISTORY: Patient s/p fall November 29, 2017 resulting in diagnosis of  central cord syndrome at C 6 level.  She has had therapy in multiple venues but with recent move to [facility], therapy staff recommended she seek outpatient therapy for her needs. Pt. Had a recent fall with Bilateral Closed Hip Fractures, with ORIF repair with Intramedullary Nailing of the Left Hip Fracture.   PRECAUTIONS: None  SUBJECTIVE:   Pt. Reports having had a good weekend. PAIN: Are you having pain No  OBJECTIVE:    Today's Treatment:    Manual Therapy:   Pt. tolerated soft tissue massage to the volar, and dorsal surface of each digit on the bilateral hands,  and thenar eminence secondary to stiffness following moist heat modality. Soft tissue mobilization was performed for Carpal, and metacarpal spread stretches. Manual therapy was performed independent of, and in preparation for therapeutic Ex.   Therapeutic Ex.    Pt. tolerated PROM followed by AROM bilateral wrist extension, PROM for bilateral digit MP, PIP, and DIP flexion, and extension, thumb radial, and palmar abduction. Emphasis was placed on left wrist extension with moist heat modality 2/2 tightness.Pt. Worked on BUE strengthening, and reciprocal motion using the UBE while seated for 8 min. on moderate level workload. Constant monitoring was provided.         PATIENT EDUCATION: RUE Distal ROM, and strength Person educated: Patient Education method: Explanation and Demonstration Education comprehension: verbalized understanding, returned demonstration, and needs further education   HOME EXERCISE PROGRAM Continue with ongoing HEPs  Measurements:  12/09/2021:  Shoulder flexion: R: 120(136), L: 138(150) Shoulder abduction: R: 108(120), L: 110(120) Elbow: R: 0-148, L: 0-146 Wrist flexion: R: 55(62), L: 78 Writ extension: R: 34(55), L: 3(4) RD: R: 12(20), L: 16(26) UD: R: 8(24), L: 6(18) Thumb radial abduction: R: 11(20), L: 8(20) Digit flexion to the [facility]: R:  2nd: 5cm(3.5cm), 3rd: 7.5 cm(5cm), 4th: 7cm(3cm), 5th: 6cm(5.5cm) L: 2nd: 8cm(8cm), 3rd: 8cm(8cm), 4th: 7.5cm(7cm), 5th: 7cm(7cm)  02/03/2022:   Shoulder flexion: R: 122(138), L: 148(154) Shoulder abduction: R: 109(125), L: 125(125 Elbow: R: 0-148, L: 0-146 Wrist flexion: R: 60(68), L: 79 Writ extension: R: 40(55), L: 3(10) RD: R: 14(24), L: 20(28) UD: R: 8(24), L: 6(18) Thumb radial abduction: R: 20(25), L: 8(20) Digit flexion to the [facility]: R:  2nd: 4.5cm(3cm), 3rd: 6.5cm(4.5cm), 4th: 6.5 cm(3cm), 5th: 4.5cm(5cm) L: 2nd: 8cm(8cm), 3rd: 8cm(7cm), 4th: 7.5cm(7cm), 5th: 6.5cm(6.5cm)   04/07/2022:   Shoulder  flexion: R: 125(150), L: 160(160) Shoulder abduction: R: 109(125), L: 125(125 Elbow: R: 0-148, L: 0-146 Wrist flexion: R: 60(68), L: 79 Writ extension: R: 40(55), L: 5(18) RD: R: 14(24), L: 20(28) UD: R: 18(24), L: 8(18) Thumb radial abduction: R: 20(25), L: 8(20) Digit flexion to the [facility]: R:  2nd: 4.5 cm(4 cm), 3rd: 6.5cm(3cm), 4th: 7 cm (5cm), 5th: 5cm(5cm) L: 2nd: 8cm(8cm), 3rd: 8cm(7cm), 4th: 7.5cm(7cm), 5th: 7cm(7cm)   07/05/2022:   Shoulder flexion: R: 125(154), L: 160(160) Shoulder abduction: R: 95(130), L: 143(143) Elbow: R: 0-148, L: 0-146 Wrist flexion: R: 60(68), L: 79 Writ extension: R: 40(60), L: 0(12) RD: R: 10(24), L: 12(20) UD: R: 16(24), L: 8(16) Thumb radial abduction: R: 20(25), L: 8(20) Digit flexion to the [facility]: R:  2nd: 4 cm(4cm), 3rd: 6.5cm( 3.5cm), 4th: 7 cm (5cm), 5th: 6cm(5cm) L: 2nd: 8cm(8cm), 3rd: 8cm(7cm), 4th: 8cm(7cm), 5th: 7cm(7cm)  09/13/2022:   Shoulder flexion: R: 125(154), L: 160(160) Shoulder abduction: R: 100(130), L: 143(143) Elbow: R: 0-150, L: 0-150 Wrist flexion: R: 55(68), L: 79 Writ extension: R: 45(60), L: 0(12) RD: R: 10(24), L: 12(20) UD: R:  16(24), L: 8(16) Thumb radial abduction: R: 20(25), L: 8(20) Digit flexion to the [facility]: R:  2nd: 4 cm(4cm), 3rd: 6cm( 5cm), 4th: 6 cm (4cm), 5th: 5cm(5cm) L: 2nd: 7cm(cm), 3rd: 7cm(6cm), 4th: 7cm(6cm), 5th: 6cm(6cm)  10/27/2022   Shoulder flexion: R: 161(096), L: 160(160) Shoulder abduction: R: 105(130), L: 143(143) Elbow: R: 0-150, L: 0-150 Wrist flexion: R: 55(68), L: 79 Writ extension: R: 46(64), L: 0(20) RD: R: 10(20), L: 18(24) UD: R: 118(28), L: 8(20) Thumb radial abduction: R: 30(42), L:20(24) Digit flexion to the [facility]: R:  2nd: 4 cm(3cm), 3rd: 6cm( 6cm), 4th: 6 cm (5cm), 5th: 5cm(5cm) L: 2nd: 8cm(8cm), 3rd: 8cm(6cm), 4th: 8cm(7cm), 5th: 6cm(6cm)       OT Long Term Goals - 07/06/21 1106                                                                                           Target Date: 01/19/2023        OT LONG TERM GOAL #1   Title    Baseline    Time    Period    Status      OT LONG TERM GOAL #2   Title    Baseline    Time    Period    Status                OT LONG TERM GOAL #3   Title Patient will demonstrate improved composite finger flexion to be able to firmly hold  and use adaptive devices during ADLs, and IADLs.   Baseline 10/27/2022: Pt. presents with increased MP, PIP, and DIP digit tightness/stiffness limiting the formulation of bilateral composites fists during this progress reporting period limiting. Digit flexion to the [facility]: R:  2nd: 4 cm(3cm), 3rd: 6cm( 6cm), 4th: 6 cm (5cm), 5th: 5cm(5cm), L: 2nd: 8cm(8cm), 3rd: 8cm(6cm), 4th: 8cm(7cm), 5th: 6cm(6cm) 09/20/2022: Improving digit flexion to the [facility]. 09/13/22: improved digit flexion to the [facility]. 07/05/2022: Pt. presents with digit MP, PIP, and DIP extensor tightness limiting her ability to securely grip objects in her bilateral hands.  04/07/2022: Pt. Has improved with right 2nd, and 3rd digit flexion towards the [facility]. Pt. Continues to have difficulty securely holding and applying deodorant.02/03/2022: Pt. Has improved with digit flexion, however continues to have difficulty securely holding and using deoderant. 12/09/2021: Bilateral hand/digit MP, PIP, and DIP extension tightness limits her ability achieve digit flexion to hold, and apply deodorant. 10/28/2021:  Pt. continues to have difficulty holding the deodorant. 01/12/2021: Pt. Presents with limited digit extension. Pt. Is able to initiate holding deodorant, however is unable to hold it while using it.   Time 12    Period Weeks    Status Ongoing   Target Date                  OT LONG TERM GOAL #4   Title Pt. Will improve bilateral wrist extension in preparation for anticipating, and initiating reaching for objects at the table.    Baseline 10/27/2022: Pt. Is improving with bilateral wrist extension. R: 46(64) L: 0(20) 09/20/2022: limited PROM in  the Left wrist extension 2/2 tightness/stiffness. 09/13/22:  R: 55(68), L: 0(12) 07/19/2022: Bilateral wrist extension in limited. 07/05/2022: Right: 40(60) Left: 0(12) 04/07/2022: Rght: 40(55) Left: 5(18) 02/03/2022: Right: 34(55) Left: 3(4) 12/09/2021: Right  34(55)  Left  3(4) 10/28/2021:  Right 22(38), Left 0(15) 09/14/2021:  Right: 17(35), left 2(15)   Time 12   Period weeks   Status Improving/Ongoing   Target Date      OT LONG TERM GOAL #5   Title    Baseline    Time    Period    Status          OT LONG TERM GOAL  #6   TITLE    Baseline    Time    Period    Status        OT LONG TERM GOAL  #7  TITLE Pt. Will increase  bilateral lateral pinch strength by 2 lbs to be able to securely grasp items during ADLs, and IADL tasks.   Baseline 10/27/2023: Pt. Is improving with holding items with her bilateral thumbs. (Pinch meter out for calibration) 09/20/2022: lateral pinch continues to be limited 09/13/22: R 6.5# L 4# 07/19/2022: TBD 07/05/2022: TBD 07/2022: NT-Pinch meter out for calibration. 02/03/2022: Right: 6#, Left: 4# 12/09/2021:  Right: 6#, Left: 4#   Time 12  Period Weeks  Status Ongoing            OT LONG TERM GOAL  #8  TITLE   Baseline   Time   Period   Status    OT LONG TERM GOAL  #9  TITLE Pt. will complete plant care with modified independence.  Baseline 10/27/2022: Pt. Is able to water plants that are closer, and  within reach. Pt. continues to have difficulty reaching for thorough plant care. 09/20/2022: Pt. Continues to have difficulty reaching for thorough plant care. 09/13/22: continues to report intermittent difficulty 07/19/2022: Pt. Continues to be able to water, and care for some  of her plants. Pt. Has more difficulty with plants that are harder to reach. 07/05/2022: pt. Is able to water, and care for some  of her plants. Pt. Has more difficulty with plants that are harder to reach. 04/07/2022: Pt. is now able to hold a cup and water her plants.02/03/2022: Pt. has  difficulty caring for her plants.   Time 12  Period Weeks  Status Ongoing   OT LONG TERM GOAL  #10  TITLE Pt. will demonstrate adaptive techniques to assist with the efficiency of self-dressing, or morning care tasks.  Baseline 10/27/2022: Pt. is able to assist with initiating UE dressing. Pt. requires assist from personal, and staff care aides. 09/20/2022: Pt. continues to require assist form personal, and staff care aides. 09/13/22: MOD A dressing, reports she is often rushed 07/19/2022: Pt. continues to require assist with self-dressing/morning care tasks.   07/05/2022: Continue with goal.  04/07/2022: Pt. Continues to require assist with the efficiency of self-dressing, and morning care tasks.02/03/2022: Pt. requires assist from caregivers 2/2 time limitations during morning care.   Time 12  Period Weeks  Status Ongoing  OT LONG TERM GOAL  #11 Title Pt.  will improve BUE strength to improve ADL, and IADL functioning.  Baseline 10/27/2022: shoulder flexion L 4/5, R 3+/5;  shoulder abduction L 4+/5, R 3+/5. elbow flexion B 5/5, elbow extension 4/5 09/20/2022: Pt. Presents with limited BUE strength 09/13/22: shoulder flexion L 4/5, R 3/5; elbow flexion B 5/5, elbow extension 4-/5; shoulder abduction L 4+/5, R 3+/5. 07/19/2022:  BUE strength continues to be limited. 07/05/2022:  shoulder flexion: right 4-/5, abduction: 3+/5, elbow flexion: right: 5/5, left 5/5, extension: right: 4-/5, left 4-/5, wrist extension: right: 3-/5, left: 2-/5  Time 12   Period Weeks   Status Ongoing  OT LONG TERM GOAL  #12 Title Pt.  will improve  bilateral thumb radial abduction in order to be able to hold the grab bars while standing with PT  Baseline 10/27/2022:  thumb radial abduction: R: 30(42), L: 20(24)  Time 12   Period Weeks   Status New  OT LONG TERM GOAL  #13 Title Pt.  Will be able to securely hold, and stabilize medication bottles at a tabletop surface when opening, and closing them.  Baseline 10/27/2022:  Pt.  Stabilizes bottles against her torso when attempting to open, and close them   Time 12   Period Weeks   Status New     Plan - 07/22/21 1737     Clinical Impression Pt. reports  feeling tired/sleepy due to not sleeping well during the night. Pt. continues to respond well to prolonged gentle stretching of the bilateral thumb web spaces. Pt. continues to present with tightness at the bilateral thumb webspace, and bilateral wrist flexor tightness, as well as digit MP, PIP, and DIP extensor tightness which continues to limit her ability to complete ADL tasks efficiently. Pt. continues to benefit from working on impoving ROM, and UE strength in order to work towards increasing bilateral hand grasp on objects, and increasing engagement of bilateral hands during ADLs, and IADL tasks.  Plan to take measurements, and do the progress report update next visit.      OT Occupational Profile and History Detailed Assessment- Review of Records and additional review of physical, cognitive, psychosocial history related to current functional performance    Occupational performance deficits (Please refer to evaluation for details): ADL's;IADL's;Leisure;Social Participation    Body Structure / Function / Physical Skills ADL;Continence;Dexterity;Flexibility;Strength;ROM;Balance;Coordination;FMC;IADL;Endurance;UE functional use;Decreased knowledge of use of DME;[facility]    Psychosocial Skills Coping Strategies    Rehab Potential Fair    Clinical Decision Making Several treatment options, min-mod task modification necessary    Comorbidities Affecting Occupational Performance: Presence of comorbidities impacting occupational performance    Comorbidities impacting occupational performance description: contractures of bilateral hands, dependent transfers    Modification or Assistance to Complete Evaluation  No modification of tasks or assist necessary to complete eval    OT Frequency 2x / week    OT Duration 12 weeks    OT  Treatment/Interventions Self-care/ADL training;Cryotherapy;Therapeutic exercise;DME and/or AE instruction;Balance training;Neuromuscular education;Manual Therapy;Splinting;Moist Heat;Passive range of motion;Therapeutic activities;Patient/family education    Plan continue to progress ROM of digits, wrists and shoulders as it pertains to completion of ADL tasks that pt values.    Consulted and Agree with Plan of Care Patient          Olegario Messier, M.S. OTR/L  11/15/22, 1:50 PM

## 2022-11-17 ENCOUNTER — Non-Acute Institutional Stay (SKILLED_NURSING_FACILITY): Payer: Medicare HMO | Admitting: Student

## 2022-11-17 ENCOUNTER — Ambulatory Visit: Payer: Medicare HMO

## 2022-11-17 ENCOUNTER — Ambulatory Visit: Payer: Medicare HMO | Admitting: Occupational Therapy

## 2022-11-17 ENCOUNTER — Encounter: Payer: Self-pay | Admitting: Student

## 2022-11-17 DIAGNOSIS — F330 Major depressive disorder, recurrent, mild: Secondary | ICD-10-CM

## 2022-11-17 DIAGNOSIS — I110 Hypertensive heart disease with heart failure: Secondary | ICD-10-CM

## 2022-11-17 DIAGNOSIS — Z95828 Presence of other vascular implants and grafts: Secondary | ICD-10-CM

## 2022-11-17 DIAGNOSIS — G8250 Quadriplegia, unspecified: Secondary | ICD-10-CM | POA: Diagnosis not present

## 2022-11-17 DIAGNOSIS — Z978 Presence of other specified devices: Secondary | ICD-10-CM

## 2022-11-17 DIAGNOSIS — Z86718 Personal history of other venous thrombosis and embolism: Secondary | ICD-10-CM

## 2022-11-17 DIAGNOSIS — I5089 Other heart failure: Secondary | ICD-10-CM

## 2022-11-17 DIAGNOSIS — M810 Age-related osteoporosis without current pathological fracture: Secondary | ICD-10-CM

## 2022-11-17 DIAGNOSIS — K592 Neurogenic bowel, not elsewhere classified: Secondary | ICD-10-CM

## 2022-11-17 NOTE — Progress Notes (Unsigned)
Location:  Other [facility] [place].  Nursing Home Room Number: [facility] [address] Place of Service:  SNF [phone]) Provider:  Earnestine Mealing, MD  Patient Care Team: Earnestine Mealing, MD as PCP - General (Family Medicine)  Extended Emergency Contact Information Primary Emergency Contact: [name],[name] Mobile Phone: [phone] Relation: Daughter Secondary Emergency Contact: Dao, Mearns Address: [address]          [place], [place] [address] [place] of [place] Home Phone: [phone] Mobile Phone: [phone] Relation: Spouse  Code Status:  DNR Goals of care: Advanced Directive information    11/17/2022    1:26 PM  Advanced Directives  Does Patient Have a Medical Advance Directive? Yes  Type of Estate agent of [place];Out of facility DNR (pink MOST or yellow form);Living will  Does patient want to make changes to medical advance directive? No - Patient declined  Copy of Healthcare Power of Attorney in Chart? Yes - validated most recent copy scanned in chart (See row information)     Chief Complaint  Patient presents with   Medical Management of Chronic Issues    Medical Management of Chronic Issues.     HPI:  Pt is a 88 y.o. female seen today for medical management of chronic diseases.    Patient states she is feeling fine at this time.  She did have episode of low blood pressure 80s over 60s.  This is not the first time this is occurred.  She had a salty snack and more water.  She usually drinks about 36 ounces of water per day.  Denies headache, dizziness, shortness of breath, chest pain.  Nursing states in general she is at her normal self.  Periodically requesting colonic emptying with high doses of MiraLAX, otherwise no significant changes from baseline.     Past Medical History:  Diagnosis Date   Acute blood loss anemia    Arthritis    Cancer (HCC)    skin   Central cord syndrome at C6 level of cervical spinal cord (HCC)  11/29/2017   Hypertension    Protein-calorie malnutrition, severe (HCC) 01/24/2018   S/P insertion of IVC (inferior vena caval) filter 01/24/2018   Tetraparesis (HCC)    Past Surgical History:  Procedure Laterality Date   ANTERIOR CERVICAL DECOMP/DISCECTOMY FUSION N/A 11/29/2017   Procedure: Cervical five-six, six-seven Anterior Cervical Decompression Fusion;  Surgeon: Jadene Pierini, MD;  Location: [facility] OR;  Service: Neurosurgery;  Laterality: N/A;  Cervical five-six, six-seven Anterior Cervical Decompression Fusion   CATARACT EXTRACTION     FEMUR IM NAIL Left 08/16/2021   Procedure: INTRAMEDULLARY (IM) NAIL FEMORAL;  Surgeon: Deeann Saint, MD;  Location: [facility] ORS;  Service: Orthopedics;  Laterality: Left;   IR IVC FILTER PLMT / S&I /IMG GUID/MOD SED  12/08/2017    Allergies  Allergen Reactions   Other Swelling   Lisinopril-Hydrochlorothiazide Swelling    Swelling of the tongue   Beef-Derived Products Swelling    Facial and lip swelling (due to tick bite)   Dairy Aid [Tilactase] Swelling   Lisinopril-Hydrochlorothiazide Swelling    Tongue swelling   Pork-Derived Products Swelling    Outpatient Encounter Medications as of 11/17/2022  Medication Sig   acetaminophen (TYLENOL) 500 MG tablet Take 500 mg by mouth every 12 (twelve) hours as needed for mild pain.   acetaminophen (TYLENOL) 650 MG CR tablet Take 650 mg by mouth every 8 (eight) hours as needed for pain.   acetic acid 0.25 % irrigation Use 30ml via irrigation twice  daily to keep foley Cath patent.   alendronate (FOSAMAX) 70 MG tablet Take 70 mg by mouth every Saturday.   calcium carbonate (CALCIUM 600) 600 MG TABS tablet Take 600 mg by mouth at bedtime.   Capsaicin 0.1 % CREA Apply 1 application  topically every evening. (Apply to hands)   cetirizine (ZYRTEC) 5 MG tablet Take 5 mg by mouth 2 (two) times daily.   cholecalciferol (VITAMIN D3) 25 MCG (1000 UNIT) tablet Take 1,000 Units by mouth daily.   DULoxetine  (CYMBALTA) 60 MG capsule Take 60 mg by mouth daily.   ferrous sulfate 325 (65 FE) MG tablet Take 325 mg by mouth daily with breakfast.   fluocinonide (LIDEX) 0.05 % external solution Apply 1 Application topically every 12 (twelve) hours as needed.   gabapentin (NEURONTIN) 100 MG capsule Take 1 capsule (100 mg total) by mouth daily.   hydroxypropyl methylcellulose / hypromellose (ISOPTO TEARS / GONIOVISC) 2.5 % ophthalmic solution Place 1 drop into both eyes every hour as needed for dry eyes.   Infant Care Products [facility]) OINT Apply to buttocks/gluteal folds topically every shift.   ketoconazole (NIZORAL) 2 % shampoo Apply 1 Application topically 2 (two) times a week.   lactulose, encephalopathy, (CHRONULAC) 10 GM/15ML SOLN Take 30 mLs by mouth daily as needed (mild constipation).   magnesium citrate solution Take 60-120 mLs by mouth daily as needed (constipation).   methylcellulose oral powder Take 1 packet by mouth at bedtime.   mometasone (ELOCON) 0.1 % lotion Apply 1 Application topically every Saturday at 6 PM. (Apply to the ear canal for eczema)   Multiple Vitamin (MULTIVITAMIN WITH MINERALS) TABS tablet Take 1 tablet by mouth daily.   olopatadine (PATADAY) 0.1 % ophthalmic solution Place 1 drop into both eyes 2 (two) times daily.   polyethylene glycol (MIRALAX / GLYCOLAX) 17 g packet Take 17 g by mouth 2 (two) times daily.   rivaroxaban (XARELTO) 10 MG TABS tablet Take 10 mg by mouth daily.   saccharomyces boulardii (FLORASTOR) 250 MG capsule Take 250 mg by mouth daily.   Saline Spray 0.2 % SOLN Place 2 sprays into both nostrils as needed (nasal congestion).   simethicone (MYLICON) 80 MG chewable tablet Chew 80 mg by mouth every 6 (six) hours as needed for flatulence.   sodium phosphate (FLEET) 7-19 GM/118ML ENEM Place 1 enema rectally every other day as needed for severe constipation.   spironolactone (ALDACTONE) 25 MG tablet Take 0.5 tablets (12.5 mg total) by mouth daily.    tiZANidine (ZANAFLEX) 2 MG tablet Take 1 tablet (2 mg total) by mouth 2 (two) times daily.   triamcinolone cream (KENALOG) 0.1 % Apply to thigh and lower legs topically every 8 hours as needed. Apply to back of neck topically as needed.   Zinc Oxide (TRIPLE PASTE) 12.8 % ointment Apply 1 Application topically. Every shift.   No facility-administered encounter medications on file as of 11/17/2022.    Review of Systems  Immunization History  Administered Date(s) Administered   Influenza, High Dose Seasonal PF 11/17/2021   Influenza-Unspecified 11/14/2018, 11/20/2019, 11/17/2020, 11/17/2021   Moderna Covid-19 Fall Seasonal Vaccine 35yrs & older 05/11/2022   Moderna Covid-19 Vaccine Bivalent Booster 41yrs & up 06/30/2021, 12/11/2021, 10/29/2022   Moderna Sars-Covid-2 Vaccination 02/09/2019, 03/09/2019, 12/14/2019, 06/20/2020, 10/24/2020   PPD Test 05/16/2019   Tdap 11/29/2017   Zoster Recombinant(Shingrix) 09/02/2022   Pertinent  Health Maintenance Due  Topic Date Due   INFLUENZA VACCINE  09/02/2022   DEXA SCAN  Discontinued  08/19/2021   10:00 AM 08/19/2021    8:54 PM 08/20/2021    8:20 AM 03/29/2022   10:11 AM 08/31/2022    2:42 PM  Fall Risk  Falls in the past year?    0 0  Was there an injury with Fall?    0   Fall Risk Category Calculator    0   (RETIRED) Patient Fall Risk Level High fall risk High fall risk High fall risk    Patient at Risk for Falls Due to    No Fall Risks    Functional Status Survey:    Vitals:   11/17/22 1304  BP: 114/72  Pulse: 80  Resp: 18  Temp: (!) 97.5 F (36.4 C)  SpO2: 95%  Weight: 180 lb (81.6 kg)  Height: 5\' 7"  (1.702 m)   Body mass index is 28.19 kg/m. Physical Exam Constitutional:      Comments: Patient in Trendelenburg in her electric wheelchair.  Cardiovascular:     Rate and Rhythm: Normal rate and regular rhythm.     Pulses: Normal pulses.  Pulmonary:     Effort: Pulmonary effort is normal.  Musculoskeletal:         General: No swelling.  Skin:    General: Skin is warm and dry.  Neurological:     Mental Status: She is alert and oriented to person, place, and time.     Labs reviewed: Recent Labs    04/21/22 1200 06/02/22 0000 09/29/22 0000 11/04/22 0000  NA 133* 140 139 139  K 3.8 4.6 3.8 3.8  CL 100 102 100 100  CO2 30 31* 29* 32*  GLUCOSE 108*  --   --   --   BUN 8 13 10 9   CREATININE 0.68 0.5 0.5 0.5  CALCIUM 8.0* 9.0 8.6* 8.8   Recent Labs    02/08/22 0000 09/29/22 0000  AST 18 20  ALT 14 14  ALKPHOS 65 58  ALBUMIN 3.7 3.6   Recent Labs    03/08/22 0000 04/21/22 1200 04/21/22 1731 04/26/22 0000 05/03/22 0000 05/10/22 0000 05/17/22 0000 09/30/22 0000  WBC 6.2 7.1  --  5.7  --   --   --  4.7  NEUTROABS 3,683.00  --   --  3,283.00  --   --   --  2,627.00  HGB 11.9* 8.5*   < > 8.3*   < > 10.0* 10.7* 13.2  HCT 35* 26.6*   < > 26*   < > 31* 33* 41  MCV  --  99.3  --   --   --   --   --   --   PLT 360 348  --  444*  --   --   --  280   < > = values in this interval not displayed.   Lab Results  Component Value Date   TSH 1.31 09/29/2022   No results found for: "HGBA1C" No results found for: "CHOL", "HDL", "LDLCALC", "LDLDIRECT", "TRIG", "CHOLHDL"  Significant Diagnostic Results in last 30 days:  No results found.  Assessment/Plan Quadriplegia (HCC)  Hypertensive heart disease with other congestive heart failure (HCC)  Mild recurrent major depression (HCC)  Chronic indwelling Foley catheter  Neurogenic bowel  Age-related osteoporosis without current pathological fracture  S/P insertion of IVC (inferior vena caval) filter  History of DVT (deep vein thrombosis) Patient with history of quadriplegia secondary to C6 syndrome.  Maintains independence with electric wheelchair, receives support for evacuation of stool and Foley  catheter placement.  History of hypertensive heart disease currently only blood pressure at this time is spironolactone, consider  discontinuation if blood pressures continue to be less than 100/70..  Labile blood pressure in the last few weeks responsive to salty foods and water.  Encourage patient to have 40 to 60 ounces of water if possible.  Tolerating indwelling catheter at this time.  History of osteoporosis continue Fosamax weekly at this time.  History of reactive mood disorder continue Cymbalta.  Continue Zanaflex for spasms.  Continue Xarelto for anticoagulation in setting of previous DVT.  Continue gabapentin for neuropathic pain.  Family/ staff Communication: Nursing  Labs/tests ordered: Routine

## 2022-11-22 ENCOUNTER — Ambulatory Visit: Payer: Medicare HMO | Admitting: Occupational Therapy

## 2022-11-22 ENCOUNTER — Ambulatory Visit: Payer: Medicare HMO

## 2022-11-22 DIAGNOSIS — R262 Difficulty in walking, not elsewhere classified: Secondary | ICD-10-CM

## 2022-11-22 DIAGNOSIS — M6281 Muscle weakness (generalized): Secondary | ICD-10-CM

## 2022-11-22 DIAGNOSIS — R278 Other lack of coordination: Secondary | ICD-10-CM

## 2022-11-22 DIAGNOSIS — R269 Unspecified abnormalities of gait and mobility: Secondary | ICD-10-CM

## 2022-11-22 DIAGNOSIS — R2681 Unsteadiness on feet: Secondary | ICD-10-CM

## 2022-11-22 DIAGNOSIS — S72001A Fracture of unspecified part of neck of right femur, initial encounter for closed fracture: Secondary | ICD-10-CM

## 2022-11-22 DIAGNOSIS — S14129S Central cord syndrome at unspecified level of cervical spinal cord, sequela: Secondary | ICD-10-CM

## 2022-11-22 NOTE — Therapy (Addendum)
Occupational Therapy Neuro Treatment Note   Patient Name: Sherry [name] MRN: [id number] DOB:Jul 04, 1934, 88 y.o., female Today's Date: 09/20/2022                                 PCP: Dr. Tillman Abide REFERRING PROVIDER: Dr. Earnestine Mealing   OT End of Session - 11/22/22 1510     Visit Number [id number]    Number of Visits 96    Date for OT Re-Evaluation 01/19/23    Authorization Time Period Progress report period starting 09/13/2022    OT Start Time 1100    OT Stop Time 1145    OT Time Calculation (min) 45 min    Equipment Utilized During Treatment powered wheelchair    Activity Tolerance Patient tolerated treatment well    Behavior During Therapy WFL for tasks assessed/performed                       Past Medical History:  Diagnosis Date   Acute blood loss anemia    Arthritis    Cancer (HCC)    skin   Central cord syndrome at C6 level of cervical spinal cord (HCC) 11/29/2017   Hypertension    Protein-calorie malnutrition, severe (HCC) 01/24/2018   S/P insertion of IVC (inferior vena caval) filter 01/24/2018   Tetraparesis (HCC)    Past Surgical History:  Procedure Laterality Date   ANTERIOR CERVICAL DECOMP/DISCECTOMY FUSION N/A 11/29/2017   Procedure: Cervical five-six, six-seven Anterior Cervical Decompression Fusion;  Surgeon: Jadene Pierini, MD;  Location: [facility] OR;  Service: Neurosurgery;  Laterality: N/A;  Cervical five-six, six-seven Anterior Cervical Decompression Fusion   CATARACT EXTRACTION     FEMUR IM NAIL Left 08/16/2021   Procedure: INTRAMEDULLARY (IM) NAIL FEMORAL;  Surgeon: Deeann Saint, MD;  Location: [facility] ORS;  Service: Orthopedics;  Laterality: Left;   IR IVC FILTER PLMT / S&I /IMG GUID/MOD SED  12/08/2017   Patient Active Problem List   Diagnosis Date Noted   Age-related osteoporosis without current pathological fracture 07/27/2022   Mild recurrent major depression (HCC) 07/27/2022   Hypertensive heart disease with other congestive heart  failure (HCC) 02/14/2022   Chronic indwelling Foley catheter 02/14/2022   Closed hip fracture (HCC) 08/15/2021   DVT (deep venous thrombosis) (HCC) 08/15/2021   Quadriplegia (HCC) 08/15/2021   Fall 08/15/2021   Constipation due to slow transit 08/31/2018   Trauma 06/05/2018   Neuropathic pain 06/05/2018   Neurogenic bowel 06/05/2018   Vaginal yeast infection 01/30/2018   Healthcare-associated pneumonia 01/25/2018   Chronic allergic rhinitis 01/24/2018   Depression with anxiety 01/24/2018   UTI due to Klebsiella species 01/24/2018   Protein-calorie malnutrition, severe (HCC) 01/24/2018   S/P insertion of IVC (inferior vena caval) filter 01/24/2018   Tetraparesis (HCC) 01/20/2018   Neurogenic bladder 01/20/2018   Reactive depression    Benign essential HTN    Acute postoperative anemia due to expected blood loss    Central cord syndrome at C6 level of cervical spinal cord (HCC) 11/29/2017   Allergy to alpha-gal 11/25/2016   SCC (squamous cell carcinoma) 04/08/2014    REFERRING DIAG: Central Cord Syndrome at C6 of the Cervical Spinal Cord, Fall with Bilateral Closed Hip Fractures, with ORIF repair with Intramedullary Nailing of the Left Hip Fracture.  THERAPY DIAG:   Muscle weakness (generalized)  Other lack of coordination  Rationale for Evaluation and Treatment Rehabilitation  PERTINENT HISTORY: Patient  s/p fall November 29, 2017 resulting in diagnosis of central cord syndrome at C 6 level.  She has had therapy in multiple venues but with recent move to [facility], therapy staff recommended she seek outpatient therapy for her needs. Pt. Had a recent fall with Bilateral Closed Hip Fractures, with ORIF repair with Intramedullary Nailing of the Left Hip Fracture.   PRECAUTIONS: None  SUBJECTIVE:   Pt. Reports having [place]. PAIN: Are you having pain No  OBJECTIVE:    Today's Treatment:    Manual Therapy:   Pt. tolerated soft tissue massage  to the volar, and dorsal surface of each digit on the bilateral hands, and thenar eminence secondary to stiffness following moist heat modality. Soft tissue mobilization was performed for carpal, and metacarpal spread stretches. Manual therapy was performed independent of, and in preparation for therapeutic Ex.   Therapeutic Ex.    Pt. tolerated PROM followed by AROM bilateral wrist extension, PROM for bilateral digit MP, PIP, and DIP flexion, and extension, thumb radial, and palmar abduction. Emphasis was placed on left wrist extension with moist heat modality 2/2 tightness. Pt. performed 2# dowel ex. for UE strengthening secondary to weakness. Bilateral shoulder flexion, chest presses were performed.  Pt. performed 2 trials of bilateral reaching in all planes for cones. Pt. worked on AROM for the wrist in all planes using the wrist maze fro 2 sets with the right, and 1 set with the left wrist.       PATIENT EDUCATION: RUE Distal ROM, and strength Person educated: Patient Education method: Medical illustrator Education comprehension: verbalized understanding, returned demonstration, and needs further education   HOME EXERCISE PROGRAM Continue with ongoing HEPs  Measurements:  12/09/2021:  Shoulder flexion: R: 120(136), L: 138(150) Shoulder abduction: R: 108(120), L: 110(120) Elbow: R: 0-148, L: 0-146 Wrist flexion: R: 55(62), L: 78 Writ extension: R: 34(55), L: 3(4) RD: R: 12(20), L: 16(26) UD: R: 8(24), L: 6(18) Thumb radial abduction: R: 11(20), L: 8(20) Digit flexion to the [facility]: R:  2nd: 5cm(3.5cm), 3rd: 7.5 cm(5cm), 4th: 7cm(3cm), 5th: 6cm(5.5cm) L: 2nd: 8cm(8cm), 3rd: 8cm(8cm), 4th: 7.5cm(7cm), 5th: 7cm(7cm)  02/03/2022:   Shoulder flexion: R: 122(138), L: 148(154) Shoulder abduction: R: 109(125), L: 125(125 Elbow: R: 0-148, L: 0-146 Wrist flexion: R: 60(68), L: 79 Writ extension: R: 40(55), L: 3(10) RD: R: 14(24), L: 20(28) UD: R: 8(24), L: 6(18) Thumb  radial abduction: R: 20(25), L: 8(20) Digit flexion to the [facility]: R:  2nd: 4.5cm(3cm), 3rd: 6.5cm(4.5cm), 4th: 6.5 cm(3cm), 5th: 4.5cm(5cm) L: 2nd: 8cm(8cm), 3rd: 8cm(7cm), 4th: 7.5cm(7cm), 5th: 6.5cm(6.5cm)   04/07/2022:   Shoulder flexion: R: 125(150), L: 160(160) Shoulder abduction: R: 109(125), L: 125(125 Elbow: R: 0-148, L: 0-146 Wrist flexion: R: 60(68), L: 79 Writ extension: R: 40(55), L: 5(18) RD: R: 14(24), L: 20(28) UD: R: 18(24), L: 8(18) Thumb radial abduction: R: 20(25), L: 8(20) Digit flexion to the [facility]: R:  2nd: 4.5 cm(4 cm), 3rd: 6.5cm(3cm), 4th: 7 cm (5cm), 5th: 5cm(5cm) L: 2nd: 8cm(8cm), 3rd: 8cm(7cm), 4th: 7.5cm(7cm), 5th: 7cm(7cm)   07/05/2022:   Shoulder flexion: R: 125(154), L: 160(160) Shoulder abduction: R: 95(130), L: 143(143) Elbow: R: 0-148, L: 0-146 Wrist flexion: R: 60(68), L: 79 Writ extension: R: 40(60), L: 0(12) RD: R: 10(24), L: 12(20) UD: R: 16(24), L: 8(16) Thumb radial abduction: R: 20(25), L: 8(20) Digit flexion to the [facility]: R:  2nd: 4 cm(4cm), 3rd: 6.5cm( 3.5cm), 4th: 7 cm (5cm), 5th: 6cm(5cm) L: 2nd: 8cm(8cm),  3rd: 8cm(7cm), 4th: 8cm(7cm), 5th: 7cm(7cm)  09/13/2022:   Shoulder flexion: R: 125(154), L: 160(160) Shoulder abduction: R: 100(130), L: 143(143) Elbow: R: 0-150, L: 0-150 Wrist flexion: R: 55(68), L: 79 Writ extension: R: 45(60), L: 0(12) RD: R: 10(24), L: 12(20) UD: R: 16(24), L: 8(16) Thumb radial abduction: R: 20(25), L: 8(20) Digit flexion to the [facility]: R:  2nd: 4 cm(4cm), 3rd: 6cm( 5cm), 4th: 6 cm (4cm), 5th: 5cm(5cm) L: 2nd: 7cm(cm), 3rd: 7cm(6cm), 4th: 7cm(6cm), 5th: 6cm(6cm)  10/27/2022   Shoulder flexion: R: 128(154), L: 160(160) Shoulder abduction: R: 105(130), L: 143(143) Elbow: R: 0-150, L: 0-150 Wrist flexion: R: 55(68), L: 79 Writ extension: R: 46(64), L: 0(20) RD: R: 10(20), L: 18(24) UD: R: 118(28), L: 8(20) Thumb radial abduction: R: 30(42), L:20(24) Digit flexion to the [facility]: R:  2nd: 4 cm(3cm), 3rd: 6cm(  6cm), 4th: 6 cm (5cm), 5th: 5cm(5cm) L: 2nd: 8cm(8cm), 3rd: 8cm(6cm), 4th: 8cm(7cm), 5th: 6cm(6cm)       OT Long Term Goals - 07/06/21 1106                                                                                          Target Date: 01/19/2023        OT LONG TERM GOAL #1   Title    Baseline    Time    Period    Status      OT LONG TERM GOAL #2   Title    Baseline    Time    Period    Status                OT LONG TERM GOAL #3   Title Patient will demonstrate improved composite finger flexion to be able to firmly hold  and use adaptive devices during ADLs, and IADLs.   Baseline 10/27/2022: Pt. presents with increased MP, PIP, and DIP digit tightness/stiffness limiting the formulation of bilateral composites fists during this progress reporting period limiting. Digit flexion to the [facility]: R:  2nd: 4 cm(3cm), 3rd: 6cm( 6cm), 4th: 6 cm (5cm), 5th: 5cm(5cm), L: 2nd: 8cm(8cm), 3rd: 8cm(6cm), 4th: 8cm(7cm), 5th: 6cm(6cm) 09/20/2022: Improving digit flexion to the [facility]. 09/13/22: improved digit flexion to the [facility]. 07/05/2022: Pt. presents with digit MP, PIP, and DIP extensor tightness limiting her ability to securely grip objects in her bilateral hands.  04/07/2022: Pt. Has improved with right 2nd, and 3rd digit flexion towards the [facility]. Pt. Continues to have difficulty securely holding and applying deodorant.02/03/2022: Pt. Has improved with digit flexion, however continues to have difficulty securely holding and using deoderant. 12/09/2021: Bilateral hand/digit MP, PIP, and DIP extension tightness limits her ability achieve digit flexion to hold, and apply deodorant. 10/28/2021:  Pt. continues to have difficulty holding the deodorant. 01/12/2021: Pt. Presents with limited digit extension. Pt. Is able to initiate holding deodorant, however is unable to hold it while using it.   Time 12    Period Weeks    Status Ongoing   Target Date                  OT LONG TERM GOAL #4   Title  Pt.  Will improve bilateral wrist extension in preparation for anticipating, and initiating reaching for objects at the table.    Baseline 10/27/2022: Pt. Is improving with bilateral wrist extension. R: 46(64) L: 0(20) 09/20/2022: limited PROM in the Left wrist extension 2/2 tightness/stiffness. 09/13/22: R: 55(68), L: 0(12) 07/19/2022: Bilateral wrist extension in limited. 07/05/2022: Right: 40(60) Left: 0(12) 04/07/2022: Rght: 40(55) Left: 5(18) 02/03/2022: Right: 34(55) Left: 3(4) 12/09/2021: Right  34(55)  Left  3(4) 10/28/2021:  Right 22(38), Left 0(15) 09/14/2021:  Right: 17(35), left 2(15)   Time 12   Period weeks   Status Improving/Ongoing   Target Date      OT LONG TERM GOAL #5   Title    Baseline    Time    Period    Status          OT LONG TERM GOAL  #6   TITLE    Baseline    Time    Period    Status        OT LONG TERM GOAL  #7  TITLE Pt. Will increase  bilateral lateral pinch strength by 2 lbs to be able to securely grasp items during ADLs, and IADL tasks.   Baseline 10/27/2023: Pt. Is improving with holding items with her bilateral thumbs. (Pinch meter out for calibration) 09/20/2022: lateral pinch continues to be limited 09/13/22: R 6.5# L 4# 07/19/2022: TBD 07/05/2022: TBD 07/2022: NT-Pinch meter out for calibration. 02/03/2022: Right: 6#, Left: 4# 12/09/2021:  Right: 6#, Left: 4#   Time 12  Period Weeks  Status Ongoing            OT LONG TERM GOAL  #8  TITLE   Baseline   Time   Period   Status    OT LONG TERM GOAL  #9  TITLE Pt. will complete plant care with modified independence.  Baseline 10/27/2022: Pt. Is able to water plants that are closer, and  within reach. Pt. continues to have difficulty reaching for thorough plant care. 09/20/2022: Pt. Continues to have difficulty reaching for thorough plant care. 09/13/22: continues to report intermittent difficulty 07/19/2022: Pt. Continues to be able to water, and care for some  of her plants. Pt. Has more difficulty with plants  that are harder to reach. 07/05/2022: pt. Is able to water, and care for some  of her plants. Pt. Has more difficulty with plants that are harder to reach. 04/07/2022: Pt. is now able to hold a cup and water her plants.02/03/2022: Pt. has difficulty caring for her plants.   Time 12  Period Weeks  Status Ongoing   OT LONG TERM GOAL  #10  TITLE Pt. will demonstrate adaptive techniques to assist with the efficiency of self-dressing, or morning care tasks.  Baseline 10/27/2022: Pt. is able to assist with initiating UE dressing. Pt. requires assist from personal, and staff care aides. 09/20/2022: Pt. continues to require assist form personal, and staff care aides. 09/13/22: MOD A dressing, reports she is often rushed 07/19/2022: Pt. continues to require assist with self-dressing/morning care tasks.   07/05/2022: Continue with goal.  04/07/2022: Pt. Continues to require assist with the efficiency of self-dressing, and morning care tasks.02/03/2022: Pt. requires assist from caregivers 2/2 time limitations during morning care.   Time 12  Period Weeks  Status Ongoing  OT LONG TERM GOAL  #11 Title Pt.  will improve BUE strength to improve ADL, and IADL functioning.  Baseline 10/27/2022: shoulder flexion L 4/5, R 3+/5;  shoulder abduction L  4+/5, R 3+/5. elbow flexion B 5/5, elbow extension 4/5 09/20/2022: Pt. Presents with limited BUE strength 09/13/22: shoulder flexion L 4/5, R 3/5; elbow flexion B 5/5, elbow extension 4-/5; shoulder abduction L 4+/5, R 3+/5. 07/19/2022:  BUE strength continues to be limited. 07/05/2022: shoulder flexion: right 4-/5, abduction: 3+/5, elbow flexion: right: 5/5, left 5/5, extension: right: 4-/5, left 4-/5, wrist extension: right: 3-/5, left: 2-/5  Time 12   Period Weeks   Status Ongoing  OT LONG TERM GOAL  #12 Title Pt.  will improve  bilateral thumb radial abduction in order to be able to hold the grab bars while standing with PT  Baseline 10/27/2022:  thumb radial abduction: R: 30(42), L:  20(24)  Time 12   Period Weeks   Status New  OT LONG TERM GOAL  #13 Title Pt.  Will be able to securely hold, and stabilize medication bottles at a tabletop surface when opening, and closing them.  Baseline 10/27/2022:  Pt. Stabilizes bottles against her torso when attempting to open, and close them   Time 12   Period Weeks   Status New     Plan - 07/22/21 1737     Clinical Impression Pt. was able to tolerate the UE dowel exercises. Pt. required bilateral hand grasp on the cones when reaching. Pt.was able to complete the wrist maze with the right hand only, however for the left required support from the right hand. Pt. continues to present with tightness at the bilateral thumb webspace, and bilateral wrist flexor tightness, as well as digit MP, PIP, and DIP extensor tightness which continues to limit her ability to complete ADL tasks efficiently. Pt. continues to benefit from working on impoving ROM, and UE strength in order to work towards increasing bilateral hand grasp on objects, and increasing engagement of bilateral hands during ADLs, and IADL tasks.  Plan to take measurements, and do the progress report update next visit.      OT Occupational Profile and History Detailed Assessment- Review of Records and additional review of physical, cognitive, psychosocial history related to current functional performance    Occupational performance deficits (Please refer to evaluation for details): ADL's;IADL's;Leisure;Social Participation    Body Structure / Function / Physical Skills ADL;Continence;Dexterity;Flexibility;Strength;ROM;Balance;Coordination;[facility];IADL;Endurance;UE functional use;Decreased knowledge of use of DME;[facility]    Psychosocial Skills Coping Strategies    Rehab Potential Fair    Clinical Decision Making Several treatment options, min-mod task modification necessary    Comorbidities Affecting Occupational Performance: Presence of comorbidities impacting occupational performance     Comorbidities impacting occupational performance description: contractures of bilateral hands, dependent transfers    Modification or Assistance to Complete Evaluation  No modification of tasks or assist necessary to complete eval    OT Frequency 2x / week    OT Duration 12 weeks    OT Treatment/Interventions Self-care/ADL training;Cryotherapy;Therapeutic exercise;DME and/or AE instruction;Balance training;Neuromuscular education;Manual Therapy;Splinting;Moist Heat;Passive range of motion;Therapeutic activities;Patient/family education    Plan continue to progress ROM of digits, wrists and shoulders as it pertains to completion of ADL tasks that pt values.    Consulted and Agree with Plan of Care Patient          Olegario Messier, M.S. OTR/L  11/22/22, 3:12 PM

## 2022-11-22 NOTE — Therapy (Signed)
OUTPATIENT PHYSICAL THERAPY NEURO TREATMENT  Patient Name: Sherry [name] MRN: [id number] DOB:26-Nov-1934, 88 y.o., female Today's Date: 11/22/2022   PCP: Earnestine Mealing, MD REFERRING PROVIDER: Deeann Saint   PT End of Session - 11/22/22 1124     Visit Number 79    Number of Visits 92    Date for PT Re-Evaluation 12/29/22    Progress Note Due on Visit 80    Equipment Utilized During Treatment Gait belt    Activity Tolerance Patient tolerated treatment well;No increased pain;Patient limited by fatigue    Behavior During Therapy [facility] for tasks assessed/performed                   Past Medical History:  Diagnosis Date   Acute blood loss anemia    Arthritis    Cancer (HCC)    skin   Central cord syndrome at C6 level of cervical spinal cord (HCC) 11/29/2017   Hypertension    Protein-calorie malnutrition, severe (HCC) 01/24/2018   S/P insertion of IVC (inferior vena caval) filter 01/24/2018   Tetraparesis (HCC)    Past Surgical History:  Procedure Laterality Date   ANTERIOR CERVICAL DECOMP/DISCECTOMY FUSION N/A 11/29/2017   Procedure: Cervical five-six, six-seven Anterior Cervical Decompression Fusion;  Surgeon: Jadene Pierini, MD;  Location: [facility] OR;  Service: Neurosurgery;  Laterality: N/A;  Cervical five-six, six-seven Anterior Cervical Decompression Fusion   CATARACT EXTRACTION     FEMUR IM NAIL Left 08/16/2021   Procedure: INTRAMEDULLARY (IM) NAIL FEMORAL;  Surgeon: Deeann Saint, MD;  Location: [facility] ORS;  Service: Orthopedics;  Laterality: Left;   IR IVC FILTER PLMT / S&I /IMG GUID/MOD SED  12/08/2017   Patient Active Problem List   Diagnosis Date Noted   Age-related osteoporosis without current pathological fracture 07/27/2022   Mild recurrent major depression (HCC) 07/27/2022   Hypertensive heart disease with other congestive heart failure (HCC) 02/14/2022   Chronic indwelling Foley catheter 02/14/2022   Closed hip fracture (HCC) 08/15/2021   DVT  (deep venous thrombosis) (HCC) 08/15/2021   Quadriplegia (HCC) 08/15/2021   Fall 08/15/2021   Constipation due to slow transit 08/31/2018   Trauma 06/05/2018   Neuropathic pain 06/05/2018   Neurogenic bowel 06/05/2018   Vaginal yeast infection 01/30/2018   Healthcare-associated pneumonia 01/25/2018   Chronic allergic rhinitis 01/24/2018   Depression with anxiety 01/24/2018   UTI due to Klebsiella species 01/24/2018   Protein-calorie malnutrition, severe (HCC) 01/24/2018   S/P insertion of IVC (inferior vena caval) filter 01/24/2018   Tetraparesis (HCC) 01/20/2018   Neurogenic bladder 01/20/2018   Reactive depression    Benign essential HTN    Acute postoperative anemia due to expected blood loss    Central cord syndrome at C6 level of cervical spinal cord (HCC) 11/29/2017   Allergy to alpha-gal 11/25/2016   SCC (squamous cell carcinoma) 04/08/2014    ONSET DATE: 08/15/2021 (fall with B hip fx); Initial injury was in 2019- quadraplegia due to central cord syndrome of C6 secondary to neck fracture.   REFERRING DIAG: Bilateral Hip fx; Left Open- s/p Left IM nail femoral on 08/16/2021; Right closed  THERAPY DIAG:  Muscle weakness (generalized)  Central cord syndrome, sequela (HCC)  Abnormality of gait and mobility  Difficulty in walking, not elsewhere classified  Closed fracture of both hips, initial encounter (HCC)  Unsteadiness on feet  Other lack of coordination  Rationale for Evaluation and Treatment Rehabilitation  SUBJECTIVE:   SUBJECTIVE STATEMENT:  Patient report feeling some better- states her BP  was low some last week but feeling better this week so far.      Pt accompanied by:  Paid caregiver-   PERTINENT HISTORY: Sherry [name] is an 87yoF who experienced a fall at home on 08/15/2021 with hip fracture, s/p Left hip ORIF. PMH: quadriplegia due to central cord syndrome of C6 secondary to neck fracture, HTN, depression with anxiety, neurogenic bladder,  bilateral DVT on Xarelto (s/p of IVC filter placement). Prior history of significant PT/OT with improvement of function. Pt has full use of shoulder/elbows, limited use of hands, adaptive self feeding sucessful.     PAIN:  None current but does endorse some left thigh pain intermittent  OBJECTIVE:    TODAY'S TREATMENT:  11/22/22     BP=124/64 mmHg Right UE Support   THEREX:   Seated hip flex (AROM on left  and AAROM on right) 2x 15 each LE Prolonged knee flex stretch (left) hold 60 sec x 3 Seated LAQ 2 sets of 15 reps   Therapeutic activities: (all performed from side of // bars allowing patient to push up)   Sit to stand  x multiple attempts- Patient pulling up using her arms requiring less effort from PT today to stand- 2nd person on right side just to make sure her right knee did not buckle. Static stand x 5 trials- focusing on patient watching her knees and trying to pull herself forward for more anterior weight shift. By standing on side of bars allowed her more UE support and ability to stand more erect by using her arms more.       PATIENT EDUCATION: Education details: cues for isolated joint exercises  HOME EXERCISE PROGRAM: No changes at this time  Access Code: [facility] URL: [email or web address] Date: 11/23/2021 Prepared by: Precious Bard Exercises - Seated Gluteal Sets - 2 x daily - 7 x weekly - 2 sets - 10 reps - 5 hold - Seated Quad Set - 2 x daily - 7 x weekly - 2 sets - 10 reps - 5 hold - Seated Long Arc Quad - 2 x daily - 7 x weekly - 2 sets - 10 reps - 5 hold - Seated March - 2 x daily - 7 x weekly - 2 sets - 10 reps - 5 hold - Seated Hip Abduction - 2 x daily - 7 x weekly - 2 sets - 10 reps - 5 hold - Seated Shoulder Shrugs - 2 x daily - 7 x weekly - 2 sets - 10 reps - 5 hold - Wheelchair Pressure Relief - 2 x daily - 7 x weekly - 2 sets - 10 reps - 5 hold  GOALS: Goals reviewed with patient? Yes  SHORT TERM GOALS: Target date: 02/22/2022  Pt  will be independent with initial UE strengthening HEP in order to improve strength and balance in order to decrease fall risk and improve function at home and work. Baseline: 10/19/2021- patient with no formal UE HEP; 12/02/2021= Patient verbalized knowledge of HEP including use of theraband for UE strengthening.  Goal status: GOAL MET  LONG TERM GOALS: Target date: 12/29/2022  Pt will be independent with final for UE/LE HEP in order to improve strength and balance in order to decrease fall risk and improve function at home and work. Baseline: Patient is currently BLE NWB and unable to participate in HEP. Has order for UE strengthening. 01/11/2022- Patient now able to participate in LE strengthening although NWB still- good understanding for some basic exercises. Will keep goal active to  incorporate progressive LE strengthening exercises. 04/07/2022= Patient still participating in progressive LE seated HEP and has no questions at this time.  Goal status: MET  2.  Pt will improve FOTO to target score of 35  to display perceived improvements in ability to complete ADL's.  Baseline: 10/19/2021= 12; 12/02/2021= 17; 01/11/2022=17; 04/07/2022= 17; 07/05/2022=17; 10/06/2022- outcome measure not appropriate as patient in non-ambulatory and not consistently standing. Goal status: Goal not appropriate  3.  Pt will increase strength of B UE  by at least 1/2 MMT grade in order to demonstrate improvement in strength and function  Baseline: patient has range of 2-/5 to 4/5 BUE Strength; 12/02/2021= 4/5 except with wrist ext. 01/11/2022= 4/5 B UE strength expept for wrist Goal status: Goal revised-no longer appropriate- working with OT on all UE strengthening.   4. Pt. Will increase strength of RLE by at least 1/2 MMT grade in order to demonstrate improvement in Standing/transfers.  Baseline: 2-/5 Right hip flex/knee ext/flex; 04/07/2022= 2- with right hip flex/knee ext (lacking 28 deg from zero) 4/8= Patient able to ext right  knee lacking 18 deg from zero. 07/05/2022=Left knee approx 8 deg from zero and right knee = 23 deg from zero  Goal status: Progressing  5. Pt. Will demo ability to stand pivot transfer with max assist for improved functional mobility and less dependent need on mechanical device.   Baseline: Dependent on hoyer lift for all transfers; 04/07/2022- Unable to test secondary to patient with recent UTI and right hand procedure - will attempt to reassess next visit. ; 04/12/22: Unable to successfully stand due to feet plates on loaned power w/c; 05/10/2022- Patient still having to use rental chair and has not received her original power w/c back to practice SPT. 07/04/2021= Patient just received new power w/c and attempted standing from 1st time today- Patient able to stand with max assist today from w/c and did not attempt to perform pivot transfer- difficulty with static standing and placing weight on right LE. 10/06/2022- Not assessed today- patient needs new AFO prior to attempting transfers  Goal status: ONGOING  6. Pt. Will demonstrate improved functional LE strength as seen by ability to stand > 2 min for improved transfer ability and pregait abilities.   Baseline: not assessed today due to recent dx: UTI; 04/07/2022- Unable to test secondary to patient with recent UTI and right hand procedure - will attempt to reassess next visit; 04/12/22: Unable to successfully stand due to feet plates on loaned power w/c 05/10/2022- Patient still having to use rental chair and has not received her original power w/c back to practice Standing. 07/05/2022- Patient able to stand with max assist at trunk today for 2 min 3 sec. Will keep goal active to ensure consistency. 10/06/2022= Unable to assess today- patient with some recent blood pressure issues and needs new AFO.   Goal status: ONGOING  ASSESSMENT:  CLINICAL IMPRESSION: Attempted all standing from side of // bars versus inside of bars. Patient was able to utilize her arms more and  pull requiring less overall effort from PT to stand- still max assist and using 2nd person on opp side to assist. She was able to see the valgus in her knees by using the mirror today as feedback. She was able to stand more erect as she was able to pull herself more forward yet still very limited time in standing <2 min each trial with LE muscle weakness as limiting factor.  Pt will continue to benefit from skilled  PT services to optimize independence and reduced caregiver support.    OBJECTIVE IMPAIRMENTS Abnormal gait, decreased activity tolerance, decreased balance, decreased coordination, decreased endurance, decreased mobility, difficulty walking, decreased ROM, decreased strength, hypomobility, impaired flexibility, impaired UE functional use, postural dysfunction, and pain.   ACTIVITY LIMITATIONS carrying, lifting, bending, sitting, standing, squatting, sleeping, stairs, transfers, bed mobility, continence, bathing, toileting, dressing, self feeding, reach over head, hygiene/grooming, and caring for others  PARTICIPATION LIMITATIONS: meal prep, cleaning, laundry, medication management, personal finances, interpersonal relationship, driving, shopping, community activity, and yard work  PERSONAL FACTORS Age, Time since onset of injury/illness/exacerbation, and 1-2 comorbidities: HTN, cervical Sx  are also affecting patient's functional outcome.   REHAB POTENTIAL: Good  CLINICAL DECISION MAKING: Evolving/moderate complexity  EVALUATION COMPLEXITY: Moderate  PLAN: PT FREQUENCY: 1-2x/week  PT DURATION: 12 weeks  PLANNED INTERVENTIONS: Therapeutic exercises, Therapeutic activity, Neuromuscular re-education, Balance training, Gait training, Patient/Family education, Self Care, Joint mobilization, DME instructions, Dry Needling, Electrical stimulation, Wheelchair mobility training, Spinal mobilization, Cryotherapy, Moist heat, and Manual therapy  PLAN FOR NEXT SESSION: strength, core training,  motor control postural righting performance endurance   11:42 AM, 11/22/22 Louis Meckel, PT Physical Therapist - [facility]  Outpatient Physical Therapy- [facility] [phone]

## 2022-11-24 ENCOUNTER — Ambulatory Visit: Payer: Medicare HMO

## 2022-11-24 ENCOUNTER — Ambulatory Visit: Payer: Medicare HMO | Admitting: Occupational Therapy

## 2022-11-24 DIAGNOSIS — M6281 Muscle weakness (generalized): Secondary | ICD-10-CM | POA: Diagnosis not present

## 2022-11-24 DIAGNOSIS — S14129S Central cord syndrome at unspecified level of cervical spinal cord, sequela: Secondary | ICD-10-CM

## 2022-11-24 DIAGNOSIS — R262 Difficulty in walking, not elsewhere classified: Secondary | ICD-10-CM

## 2022-11-24 DIAGNOSIS — S72001A Fracture of unspecified part of neck of right femur, initial encounter for closed fracture: Secondary | ICD-10-CM

## 2022-11-24 DIAGNOSIS — R269 Unspecified abnormalities of gait and mobility: Secondary | ICD-10-CM

## 2022-11-24 DIAGNOSIS — R278 Other lack of coordination: Secondary | ICD-10-CM

## 2022-11-24 DIAGNOSIS — R2681 Unsteadiness on feet: Secondary | ICD-10-CM

## 2022-11-24 NOTE — Therapy (Addendum)
Occupational Therapy Neuro Treatment Note   Patient Name: Sherry [name] MRN: [id number] DOB:[birth year], 88 y.o., female Today's Date: 09/20/2022                                 PCP: Dr. Tillman Abide REFERRING PROVIDER: Dr. Earnestine Mealing   OT End of Session - 11/24/22 1215     Visit Number [id number]    Number of Visits 96    Date for OT Re-Evaluation 01/19/23    Authorization Time Period Progress report period starting 0812/2024    OT Start Time 1100    OT Stop Time 1145    OT Time Calculation (min) 45 min    Equipment Utilized During Treatment powered wheelchair    Activity Tolerance Patient tolerated treatment well    Behavior During Therapy WFL for tasks assessed/performed                       Past Medical History:  Diagnosis Date   Acute blood loss anemia    Arthritis    Cancer (HCC)    skin   Central cord syndrome at C6 level of cervical spinal cord (HCC) 11/29/2017   Hypertension    Protein-calorie malnutrition, severe (HCC) 01/24/2018   S/P insertion of IVC (inferior vena caval) filter 01/24/2018   Tetraparesis (HCC)    Past Surgical History:  Procedure Laterality Date   ANTERIOR CERVICAL DECOMP/DISCECTOMY FUSION N/A 11/29/2017   Procedure: Cervical five-six, six-seven Anterior Cervical Decompression Fusion;  Surgeon: Jadene Pierini, MD;  Location: [facility] OR;  Service: Neurosurgery;  Laterality: N/A;  Cervical five-six, six-seven Anterior Cervical Decompression Fusion   CATARACT EXTRACTION     FEMUR IM NAIL Left 08/16/2021   Procedure: INTRAMEDULLARY (IM) NAIL FEMORAL;  Surgeon: Deeann Saint, MD;  Location: [facility] ORS;  Service: Orthopedics;  Laterality: Left;   IR IVC FILTER PLMT / S&I /IMG GUID/MOD SED  12/08/2017   Patient Active Problem List   Diagnosis Date Noted   Age-related osteoporosis without current pathological fracture 07/27/2022   Mild recurrent major depression (HCC) 07/27/2022   Hypertensive heart disease with other congestive heart  failure (HCC) 02/14/2022   Chronic indwelling Foley catheter 02/14/2022   Closed hip fracture (HCC) 08/15/2021   DVT (deep venous thrombosis) (HCC) 08/15/2021   Quadriplegia (HCC) 08/15/2021   Fall 08/15/2021   Constipation due to slow transit 08/31/2018   Trauma 06/05/2018   Neuropathic pain 06/05/2018   Neurogenic bowel 06/05/2018   Vaginal yeast infection 01/30/2018   Healthcare-associated pneumonia 01/25/2018   Chronic allergic rhinitis 01/24/2018   Depression with anxiety 01/24/2018   UTI due to Klebsiella species 01/24/2018   Protein-calorie malnutrition, severe (HCC) 01/24/2018   S/P insertion of IVC (inferior vena caval) filter 01/24/2018   Tetraparesis (HCC) 01/20/2018   Neurogenic bladder 01/20/2018   Reactive depression    Benign essential HTN    Acute postoperative anemia due to expected blood loss    Central cord syndrome at C6 level of cervical spinal cord (HCC) 11/29/2017   Allergy to alpha-gal 11/25/2016   SCC (squamous cell carcinoma) 04/08/2014    REFERRING DIAG: Central Cord Syndrome at C6 of the Cervical Spinal Cord, Fall with Bilateral Closed Hip Fractures, with ORIF repair with Intramedullary Nailing of the Left Hip Fracture.  THERAPY DIAG:   Muscle weakness (generalized)  Other lack of coordination  Rationale for Evaluation and Treatment Rehabilitation  PERTINENT HISTORY: Patient  s/p fall November 29, 2017 resulting in diagnosis of central cord syndrome at C 6 level.  She has had therapy in multiple venues but with recent move to [facility], therapy staff recommended she seek outpatient therapy for her needs. Pt. Had a recent fall with Bilateral Closed Hip Fractures, with ORIF repair with Intramedullary Nailing of the Left Hip Fracture.   PRECAUTIONS: None  SUBJECTIVE:   Pt.  Reports receiving the Flu shot this morning.  PAIN: Are you having pain No  OBJECTIVE:    Today's Treatment:    Manual Therapy:   Pt. tolerated soft tissue massage to the  volar, and dorsal surface of each digit on the bilateral hands, and thenar eminence secondary to stiffness following moist heat modality. Soft tissue mobilization was performed for carpal, and metacarpal spread stretches. Manual therapy was performed independent of, and in preparation for therapeutic Ex.   Therapeutic Ex.    Pt. tolerated PROM followed by AROM bilateral wrist extension, PROM for bilateral digit MP, PIP, and DIP flexion, and extension, thumb radial, and palmar abduction. Emphasis was placed on left wrist extension with moist heat modality 2/2 tightness. Pt. performed 2# dowel ex. for UE strengthening secondary to weakness. Bilateral shoulder flexion, chest presses were performed. Pt. Worked on pinch strengthening in the right hand for lateral, and 3pt. pinch using yellow and  red resistive clips. Pt. Attempted green, however was unable to sustain pinch on it.  Pt. worked on placing the clips at various vertical and horizontal angles. Tactile and verbal cues were required for eliciting the desired movement.        PATIENT EDUCATION: RUE Distal ROM, and strength Person educated: Patient Education method: Explanation and Demonstration Education comprehension: verbalized understanding, returned demonstration, and needs further education   HOME EXERCISE PROGRAM Continue with ongoing HEPs  Measurements:  12/09/2021:  Shoulder flexion: R: 120(136), L: 138(150) Shoulder abduction: R: 108(120), L: 110(120) Elbow: R: 0-148, L: 0-146 Wrist flexion: R: 55(62), L: 78 Writ extension: R: 34(55), L: 3(4) RD: R: 12(20), L: 16(26) UD: R: 8(24), L: 6(18) Thumb radial abduction: R: 11(20), L: 8(20) Digit flexion to the [facility]: R:  2nd: 5cm(3.5cm), 3rd: 7.5 cm(5cm), 4th: 7cm(3cm), 5th: 6cm(5.5cm) L: 2nd: 8cm(8cm), 3rd: 8cm(8cm), 4th: 7.5cm(7cm), 5th: 7cm(7cm)  02/03/2022:   Shoulder flexion: R: 122(138), L: 148(154) Shoulder abduction: R: 109(125), L: 125(125 Elbow: R: 0-148, L:  0-146 Wrist flexion: R: 60(68), L: 79 Writ extension: R: 40(55), L: 3(10) RD: R: 14(24), L: 20(28) UD: R: 8(24), L: 6(18) Thumb radial abduction: R: 20(25), L: 8(20) Digit flexion to the [facility]: R:  2nd: 4.5cm(3cm), 3rd: 6.5cm(4.5cm), 4th: 6.5 cm(3cm), 5th: 4.5cm(5cm) L: 2nd: 8cm(8cm), 3rd: 8cm(7cm), 4th: 7.5cm(7cm), 5th: 6.5cm(6.5cm)   04/07/2022:   Shoulder flexion: R: 125(150), L: 160(160) Shoulder abduction: R: 109(125), L: 125(125 Elbow: R: 0-148, L: 0-146 Wrist flexion: R: 60(68), L: 79 Writ extension: R: 40(55), L: 5(18) RD: R: 14(24), L: 20(28) UD: R: 18(24), L: 8(18) Thumb radial abduction: R: 20(25), L: 8(20) Digit flexion to the [facility]: R:  2nd: 4.5 cm(4 cm), 3rd: 6.5cm(3cm), 4th: 7 cm (5cm), 5th: 5cm(5cm) L: 2nd: 8cm(8cm), 3rd: 8cm(7cm), 4th: 7.5cm(7cm), 5th: 7cm(7cm)   07/05/2022:   Shoulder flexion: R: 125(154), L: 160(160) Shoulder abduction: R: 95(130), L: 143(143) Elbow: R: 0-148, L: 0-146 Wrist flexion: R: 60(68), L: 79 Writ extension: R: 40(60), L: 0(12) RD: R: 10(24), L: 12(20) UD: R: 16(24), L: 8(16) Thumb radial abduction: R: 20(25), L: 8(20) Digit flexion to the [facility]: R:  2nd: 4 cm(4cm), 3rd: 6.5cm( 3.5cm), 4th: 7 cm (5cm), 5th: 6cm(5cm) L: 2nd: 8cm(8cm), 3rd: 8cm(7cm), 4th: 8cm(7cm), 5th: 7cm(7cm)  09/13/2022:   Shoulder flexion: R: 125(154), L: 160(160) Shoulder abduction: R: 100(130), L: 143(143) Elbow: R: 0-150, L: 0-150 Wrist flexion: R: 55(68), L: 79 Writ extension: R: 45(60), L: 0(12) RD: R: 10(24), L: 12(20) UD: R: 16(24), L: 8(16) Thumb radial abduction: R: 20(25), L: 8(20) Digit flexion to the [facility]: R:  2nd: 4 cm(4cm), 3rd: 6cm( 5cm), 4th: 6 cm (4cm), 5th: 5cm(5cm) L: 2nd: 7cm(cm), 3rd: 7cm(6cm), 4th: 7cm(6cm), 5th: 6cm(6cm)  10/27/2022   Shoulder flexion: R: 128(154), L: 160(160) Shoulder abduction: R: 105(130), L: 143(143) Elbow: R: 0-150, L: 0-150 Wrist flexion: R: 55(68), L: 79 Writ extension: R: 46(64), L: 0(20) RD: R: 10(20), L:  18(24) UD: R: 118(28), L: 8(20) Thumb radial abduction: R: 30(42), L:20(24) Digit flexion to the [facility]: R:  2nd: 4 cm(3cm), 3rd: 6cm( 6cm), 4th: 6 cm (5cm), 5th: 5cm(5cm) L: 2nd: 8cm(8cm), 3rd: 8cm(6cm), 4th: 8cm(7cm), 5th: 6cm(6cm)       OT Long Term Goals - 07/06/21 1106                                                                                          Target Date: 01/19/2023        OT LONG TERM GOAL #1   Title    Baseline    Time    Period    Status      OT LONG TERM GOAL #2   Title    Baseline    Time    Period    Status                OT LONG TERM GOAL #3   Title Patient will demonstrate improved composite finger flexion to be able to firmly hold  and use adaptive devices during ADLs, and IADLs.   Baseline 10/27/2022: Pt. presents with increased MP, PIP, and DIP digit tightness/stiffness limiting the formulation of bilateral composites fists during this progress reporting period limiting. Digit flexion to the [facility]: R:  2nd: 4 cm(3cm), 3rd: 6cm( 6cm), 4th: 6 cm (5cm), 5th: 5cm(5cm), L: 2nd: 8cm(8cm), 3rd: 8cm(6cm), 4th: 8cm(7cm), 5th: 6cm(6cm) 09/20/2022: Improving digit flexion to the [facility]. 09/13/22: improved digit flexion to the [facility]. 07/05/2022: Pt. presents with digit MP, PIP, and DIP extensor tightness limiting her ability to securely grip objects in her bilateral hands.  04/07/2022: Pt. Has improved with right 2nd, and 3rd digit flexion towards the [facility]. Pt. Continues to have difficulty securely holding and applying deodorant.02/03/2022: Pt. Has improved with digit flexion, however continues to have difficulty securely holding and using deoderant. 12/09/2021: Bilateral hand/digit MP, PIP, and DIP extension tightness limits her ability achieve digit flexion to hold, and apply deodorant. 10/28/2021:  Pt. continues to have difficulty holding the deodorant. 01/12/2021: Pt. Presents with limited digit extension. Pt. Is able to initiate holding deodorant, however is unable to hold it  while using it.   Time 12    Period Weeks    Status Ongoing   Target Date  OT LONG TERM GOAL #4   Title Pt. Will improve bilateral wrist extension in preparation for anticipating, and initiating reaching for objects at the table.    Baseline 10/27/2022: Pt. Is improving with bilateral wrist extension. R: 46(64) L: 0(20) 09/20/2022: limited PROM in the Left wrist extension 2/2 tightness/stiffness. 09/13/22: R: 55(68), L: 0(12) 07/19/2022: Bilateral wrist extension in limited. 07/05/2022: Right: 40(60) Left: 0(12) 04/07/2022: Rght: 40(55) Left: 5(18) 02/03/2022: Right: 34(55) Left: 3(4) 12/09/2021: Right  34(55)  Left  3(4) 10/28/2021:  Right 22(38), Left 0(15) 09/14/2021:  Right: 17(35), left 2(15)   Time 12   Period weeks   Status Improving/Ongoing   Target Date      OT LONG TERM GOAL #5   Title    Baseline    Time    Period    Status          OT LONG TERM GOAL  #6   TITLE    Baseline    Time    Period    Status        OT LONG TERM GOAL  #7  TITLE Pt. Will increase  bilateral lateral pinch strength by 2 lbs to be able to securely grasp items during ADLs, and IADL tasks.   Baseline 10/27/2023: Pt. Is improving with holding items with her bilateral thumbs. (Pinch meter out for calibration) 09/20/2022: lateral pinch continues to be limited 09/13/22: R 6.5# L 4# 07/19/2022: TBD 07/05/2022: TBD 07/2022: NT-Pinch meter out for calibration. 02/03/2022: Right: 6#, Left: 4# 12/09/2021:  Right: 6#, Left: 4#   Time 12  Period Weeks  Status Ongoing            OT LONG TERM GOAL  #8  TITLE   Baseline   Time   Period   Status    OT LONG TERM GOAL  #9  TITLE Pt. will complete plant care with modified independence.  Baseline 10/27/2022: Pt. Is able to water plants that are closer, and  within reach. Pt. continues to have difficulty reaching for thorough plant care. 09/20/2022: Pt. Continues to have difficulty reaching for thorough plant care. 09/13/22: continues to report  intermittent difficulty 07/19/2022: Pt. Continues to be able to water, and care for some  of her plants. Pt. Has more difficulty with plants that are harder to reach. 07/05/2022: pt. Is able to water, and care for some  of her plants. Pt. Has more difficulty with plants that are harder to reach. 04/07/2022: Pt. is now able to hold a cup and water her plants.02/03/2022: Pt. has difficulty caring for her plants.   Time 12  Period Weeks  Status Ongoing   OT LONG TERM GOAL  #10  TITLE Pt. will demonstrate adaptive techniques to assist with the efficiency of self-dressing, or morning care tasks.  Baseline 10/27/2022: Pt. is able to assist with initiating UE dressing. Pt. requires assist from personal, and staff care aides. 09/20/2022: Pt. continues to require assist form personal, and staff care aides. 09/13/22: MOD A dressing, reports she is often rushed 07/19/2022: Pt. continues to require assist with self-dressing/morning care tasks.   07/05/2022: Continue with goal.  04/07/2022: Pt. Continues to require assist with the efficiency of self-dressing, and morning care tasks.02/03/2022: Pt. requires assist from caregivers 2/2 time limitations during morning care.   Time 12  Period Weeks  Status Ongoing  OT LONG TERM GOAL  #11 Title Pt.  will improve BUE strength to improve ADL, and IADL functioning.  Baseline 10/27/2022: shoulder flexion L  4/5, R 3+/5;  shoulder abduction L 4+/5, R 3+/5. elbow flexion B 5/5, elbow extension 4/5 09/20/2022: Pt. Presents with limited BUE strength 09/13/22: shoulder flexion L 4/5, R 3/5; elbow flexion B 5/5, elbow extension 4-/5; shoulder abduction L 4+/5, R 3+/5. 07/19/2022:  BUE strength continues to be limited. 07/05/2022: shoulder flexion: right 4-/5, abduction: 3+/5, elbow flexion: right: 5/5, left 5/5, extension: right: 4-/5, left 4-/5, wrist extension: right: 3-/5, left: 2-/5  Time 12   Period Weeks   Status Ongoing  OT LONG TERM GOAL  #12 Title Pt.  will improve  bilateral thumb  radial abduction in order to be able to hold the grab bars while standing with PT  Baseline 10/27/2022:  thumb radial abduction: R: 30(42), L: 20(24)  Time 12   Period Weeks   Status New  OT LONG TERM GOAL  #13 Title Pt.  Will be able to securely hold, and stabilize medication bottles at a tabletop surface when opening, and closing them.  Baseline 10/27/2022:  Pt. Stabilizes bottles against her torso when attempting to open, and close them   Time 12   Period Weeks   Status New     Plan - 07/22/21 1737     Clinical Impression Pt. reports receiving the flu shot today. Pt. was able to tolerate the UE dowel exercises. Pt. was able to pinch the yellow, and red resistive clips. Pt. attempted the green clips, however had difficulty completing. Pt. continues to present with tightness at the bilateral thumb webspace, and bilateral wrist flexor tightness, as well as digit MP, PIP, and DIP extensor tightness which continues to limit her ability to complete ADL tasks efficiently. Pt. continues to benefit from working on impoving ROM, and UE strength in order to work towards increasing bilateral hand grasp on objects, and increasing engagement of bilateral hands during ADLs, and IADL tasks.  Plan to take measurements, and do the progress report update next visit.      OT Occupational Profile and History Detailed Assessment- Review of Records and additional review of physical, cognitive, psychosocial history related to current functional performance    Occupational performance deficits (Please refer to evaluation for details): ADL's;IADL's;Leisure;Social Participation    Body Structure / Function / Physical Skills ADL;Continence;Dexterity;Flexibility;Strength;ROM;Balance;Coordination;FMC;IADL;Endurance;UE functional use;Decreased knowledge of use of DME;[facility]    Psychosocial Skills Coping Strategies    Rehab Potential Fair    Clinical Decision Making Several treatment options, min-mod task modification necessary     Comorbidities Affecting Occupational Performance: Presence of comorbidities impacting occupational performance    Comorbidities impacting occupational performance description: contractures of bilateral hands, dependent transfers    Modification or Assistance to Complete Evaluation  No modification of tasks or assist necessary to complete eval    OT Frequency 2x / week    OT Duration 12 weeks    OT Treatment/Interventions Self-care/ADL training;Cryotherapy;Therapeutic exercise;DME and/or AE instruction;Balance training;Neuromuscular education;Manual Therapy;Splinting;Moist Heat;Passive range of motion;Therapeutic activities;Patient/family education    Plan continue to progress ROM of digits, wrists and shoulders as it pertains to completion of ADL tasks that pt values.    Consulted and Agree with Plan of Care Patient          Olegario Messier, M.S. OTR/L  11/24/22, 12:19 PM

## 2022-11-24 NOTE — Therapy (Signed)
OUTPATIENT PHYSICAL THERAPY NEURO TREATMENT/Physical Therapy Progress Note   Dates of reporting period  10/13/2022   to   11/24/2022  Patient Name: Sherry [name] MRN: [id number] DOB:21-Mar-1934, 88 y.o., female Today's Date: 11/24/2022   PCP: Earnestine Mealing, MD REFERRING PROVIDER: Deeann Saint   PT End of Session - 11/24/22 1151     Visit Number 80    Number of Visits 92    Date for PT Re-Evaluation 12/29/22    Progress Note Due on Visit 90    PT Start Time 1146    Equipment Utilized During Treatment Gait belt    Activity Tolerance Patient tolerated treatment well;No increased pain;Patient limited by fatigue    Behavior During Therapy [facility] for tasks assessed/performed                   Past Medical History:  Diagnosis Date   Acute blood loss anemia    Arthritis    Cancer (HCC)    skin   Central cord syndrome at C6 level of cervical spinal cord (HCC) 11/29/2017   Hypertension    Protein-calorie malnutrition, severe (HCC) 01/24/2018   S/P insertion of IVC (inferior vena caval) filter 01/24/2018   Tetraparesis (HCC)    Past Surgical History:  Procedure Laterality Date   ANTERIOR CERVICAL DECOMP/DISCECTOMY FUSION N/A 11/29/2017   Procedure: Cervical five-six, six-seven Anterior Cervical Decompression Fusion;  Surgeon: Jadene Pierini, MD;  Location: [facility] OR;  Service: Neurosurgery;  Laterality: N/A;  Cervical five-six, six-seven Anterior Cervical Decompression Fusion   CATARACT EXTRACTION     FEMUR IM NAIL Left 08/16/2021   Procedure: INTRAMEDULLARY (IM) NAIL FEMORAL;  Surgeon: Deeann Saint, MD;  Location: [facility] ORS;  Service: Orthopedics;  Laterality: Left;   IR IVC FILTER PLMT / S&I /IMG GUID/MOD SED  12/08/2017   Patient Active Problem List   Diagnosis Date Noted   Age-related osteoporosis without current pathological fracture 07/27/2022   Mild recurrent major depression (HCC) 07/27/2022   Hypertensive heart disease with other congestive heart failure  (HCC) 02/14/2022   Chronic indwelling Foley catheter 02/14/2022   Closed hip fracture (HCC) 08/15/2021   DVT (deep venous thrombosis) (HCC) 08/15/2021   Quadriplegia (HCC) 08/15/2021   Fall 08/15/2021   Constipation due to slow transit 08/31/2018   Trauma 06/05/2018   Neuropathic pain 06/05/2018   Neurogenic bowel 06/05/2018   Vaginal yeast infection 01/30/2018   Healthcare-associated pneumonia 01/25/2018   Chronic allergic rhinitis 01/24/2018   Depression with anxiety 01/24/2018   UTI due to Klebsiella species 01/24/2018   Protein-calorie malnutrition, severe (HCC) 01/24/2018   S/P insertion of IVC (inferior vena caval) filter 01/24/2018   Tetraparesis (HCC) 01/20/2018   Neurogenic bladder 01/20/2018   Reactive depression    Benign essential HTN    Acute postoperative anemia due to expected blood loss    Central cord syndrome at C6 level of cervical spinal cord (HCC) 11/29/2017   Allergy to alpha-gal 11/25/2016   SCC (squamous cell carcinoma) 04/08/2014    ONSET DATE: 08/15/2021 (fall with B hip fx); Initial injury was in 2019- quadraplegia due to central cord syndrome of C6 secondary to neck fracture.   REFERRING DIAG: Bilateral Hip fx; Left Open- s/p Left IM nail femoral on 08/16/2021; Right closed  THERAPY DIAG:  Muscle weakness (generalized)  Central cord syndrome, sequela (HCC)  Abnormality of gait and mobility  Difficulty in walking, not elsewhere classified  Closed fracture of both hips, initial encounter (HCC)  Unsteadiness on feet  Other lack  of coordination  Rationale for Evaluation and Treatment Rehabilitation  SUBJECTIVE:   SUBJECTIVE STATEMENT:  Patient reports no new issues since previous visit. States no caregiver in clinic with her today.       Pt accompanied by:  Paid caregiver-   PERTINENT HISTORY: Sherry [name] is an 87yoF who experienced a fall at home on 08/15/2021 with hip fracture, s/p Left hip ORIF. PMH: quadriplegia due to central cord  syndrome of C6 secondary to neck fracture, HTN, depression with anxiety, neurogenic bladder, bilateral DVT on Xarelto (s/p of IVC filter placement). Prior history of significant PT/OT with improvement of function. Pt has full use of shoulder/elbows, limited use of hands, adaptive self feeding sucessful.     PAIN:  None current but does endorse some left thigh pain intermittent  OBJECTIVE:    TODAY'S TREATMENT:  11/24/22          Therapeutic activities: (all performed from inside of // bars since no second person to assist today)  Forward lean with verbal cues on upper trunk placement and to keep eyes looking forward- 2 sets of 10 reps  Sit to stand  x multiple attempts- VC again for forward trunk lean and to squeeze gluteals to facilitate erect standing- more difficulty with only 1 person assist.   Static stand x 5 trials- focusing on standing as erect as possible- more difficulty activating gluteals- did improve with practice. Standing trials ranged from approx 1 min to 45 sec  today. Patient requested on last stand to be finished due to fatigue.       PATIENT EDUCATION: Education details: cues for isolated joint exercises  HOME EXERCISE PROGRAM: No changes at this time  Access Code: [facility] URL: [email or web address] Date: 11/23/2021 Prepared by: Precious Bard Exercises - Seated Gluteal Sets - 2 x daily - 7 x weekly - 2 sets - 10 reps - 5 hold - Seated Quad Set - 2 x daily - 7 x weekly - 2 sets - 10 reps - 5 hold - Seated Long Arc Quad - 2 x daily - 7 x weekly - 2 sets - 10 reps - 5 hold - Seated March - 2 x daily - 7 x weekly - 2 sets - 10 reps - 5 hold - Seated Hip Abduction - 2 x daily - 7 x weekly - 2 sets - 10 reps - 5 hold - Seated Shoulder Shrugs - 2 x daily - 7 x weekly - 2 sets - 10 reps - 5 hold - Wheelchair Pressure Relief - 2 x daily - 7 x weekly - 2 sets - 10 reps - 5 hold  GOALS: Goals reviewed with patient? Yes  SHORT TERM GOALS: Target date:  02/22/2022  Pt will be independent with initial UE strengthening HEP in order to improve strength and balance in order to decrease fall risk and improve function at home and work. Baseline: 10/19/2021- patient with no formal UE HEP; 12/02/2021= Patient verbalized knowledge of HEP including use of theraband for UE strengthening.  Goal status: GOAL MET  LONG TERM GOALS: Target date: 12/29/2022  Pt will be independent with final for UE/LE HEP in order to improve strength and balance in order to decrease fall risk and improve function at home and work. Baseline: Patient is currently BLE NWB and unable to participate in HEP. Has order for UE strengthening. 01/11/2022- Patient now able to participate in LE strengthening although NWB still- good understanding for some basic exercises. Will keep goal active to incorporate progressive  LE strengthening exercises. 04/07/2022= Patient still participating in progressive LE seated HEP and has no questions at this time.  Goal status: MET  2.  Pt will improve FOTO to target score of 35  to display perceived improvements in ability to complete ADL's.  Baseline: 10/19/2021= 12; 12/02/2021= 17; 01/11/2022=17; 04/07/2022= 17; 07/05/2022=17; 10/06/2022- outcome measure not appropriate as patient in non-ambulatory and not consistently standing. Goal status: Goal not appropriate  3.  Pt will increase strength of B UE  by at least 1/2 MMT grade in order to demonstrate improvement in strength and function  Baseline: patient has range of 2-/5 to 4/5 BUE Strength; 12/02/2021= 4/5 except with wrist ext. 01/11/2022= 4/5 B UE strength expept for wrist Goal status: Goal revised-no longer appropriate- working with OT on all UE strengthening.   4. Pt. Will increase strength of RLE by at least 1/2 MMT grade in order to demonstrate improvement in Standing/transfers.  Baseline: 2-/5 Right hip flex/knee ext/flex; 04/07/2022= 2- with right hip flex/knee ext (lacking 28 deg from zero) 4/8= Patient  able to ext right knee lacking 18 deg from zero. 07/05/2022=Left knee approx 8 deg from zero and right knee = 23 deg from zero  Goal status: Progressing  5. Pt. Will demo ability to stand pivot transfer with max assist for improved functional mobility and less dependent need on mechanical device.   Baseline: Dependent on hoyer lift for all transfers; 04/07/2022- Unable to test secondary to patient with recent UTI and right hand procedure - will attempt to reassess next visit. ; 04/12/22: Unable to successfully stand due to feet plates on loaned power w/c; 05/10/2022- Patient still having to use rental chair and has not received her original power w/c back to practice SPT. 07/04/2021= Patient just received new power w/c and attempted standing from 1st time today- Patient able to stand with max assist today from w/c and did not attempt to perform pivot transfer- difficulty with static standing and placing weight on right LE. 10/06/2022- Not assessed today- patient needs new AFO prior to attempting transfers  Goal status: ONGOING  6. Pt. Will demonstrate improved functional LE strength as seen by ability to stand > 2 min for improved transfer ability and pregait abilities.   Baseline: not assessed today due to recent dx: UTI; 04/07/2022- Unable to test secondary to patient with recent UTI and right hand procedure - will attempt to reassess next visit; 04/12/22: Unable to successfully stand due to feet plates on loaned power w/c 05/10/2022- Patient still having to use rental chair and has not received her original power w/c back to practice Standing. 07/05/2022- Patient able to stand with max assist at trunk today for 2 min 3 sec. Will keep goal active to ensure consistency. 10/06/2022= Unable to assess today- patient with some recent blood pressure issues and needs new AFO. 11/24/2022= Patient demonstrated multiple rounds of standing in // bars- holding up to 1:45 min today but has been able to exhibit 2 min in recent past-  working on consistency.   Goal status: ONGOING  ASSESSMENT:  CLINICAL IMPRESSION: Patient presents with good effort today- no caregiver present in clinic so static standing was tougher and patient exhibited some more difficulty contracting gluteals for ideal erect standing. She continues to work hard and no knee buckling but just more overall fatigue. She is demonstrating good progress- able to stand more during visit and focusing on consistency of standing time to improve her functional LE strength. Patient's condition has the potential to improve  in response to therapy. Maximum improvement is yet to be obtained. The anticipated improvement is attainable and reasonable in a generally predictable time.   Pt will continue to benefit from skilled PT services to optimize independence and reduced caregiver support.    OBJECTIVE IMPAIRMENTS Abnormal gait, decreased activity tolerance, decreased balance, decreased coordination, decreased endurance, decreased mobility, difficulty walking, decreased ROM, decreased strength, hypomobility, impaired flexibility, impaired UE functional use, postural dysfunction, and pain.   ACTIVITY LIMITATIONS carrying, lifting, bending, sitting, standing, squatting, sleeping, stairs, transfers, bed mobility, continence, bathing, toileting, dressing, self feeding, reach over head, hygiene/grooming, and caring for others  PARTICIPATION LIMITATIONS: meal prep, cleaning, laundry, medication management, personal finances, interpersonal relationship, driving, shopping, community activity, and yard work  PERSONAL FACTORS Age, Time since onset of injury/illness/exacerbation, and 1-2 comorbidities: HTN, cervical Sx  are also affecting patient's functional outcome.   REHAB POTENTIAL: Good  CLINICAL DECISION MAKING: Evolving/moderate complexity  EVALUATION COMPLEXITY: Moderate  PLAN: PT FREQUENCY: 1-2x/week  PT DURATION: 12 weeks  PLANNED INTERVENTIONS: Therapeutic exercises,  Therapeutic activity, Neuromuscular re-education, Balance training, Gait training, Patient/Family education, Self Care, Joint mobilization, DME instructions, Dry Needling, Electrical stimulation, Wheelchair mobility training, Spinal mobilization, Cryotherapy, Moist heat, and Manual therapy  PLAN FOR NEXT SESSION: strength, core training, Sit to stand and static standing; progress to SPT as able.    11:56 AM, 11/24/22 Louis Meckel, PT Physical Therapist - [facility]  Outpatient Physical Therapy- Main Campus [phone]

## 2022-11-29 ENCOUNTER — Ambulatory Visit: Payer: Medicare HMO

## 2022-11-29 ENCOUNTER — Ambulatory Visit: Payer: Medicare HMO | Admitting: Occupational Therapy

## 2022-11-29 DIAGNOSIS — R278 Other lack of coordination: Secondary | ICD-10-CM

## 2022-11-29 DIAGNOSIS — R2681 Unsteadiness on feet: Secondary | ICD-10-CM

## 2022-11-29 DIAGNOSIS — M6281 Muscle weakness (generalized): Secondary | ICD-10-CM | POA: Diagnosis not present

## 2022-11-29 DIAGNOSIS — R296 Repeated falls: Secondary | ICD-10-CM

## 2022-11-29 DIAGNOSIS — R2689 Other abnormalities of gait and mobility: Secondary | ICD-10-CM

## 2022-11-29 DIAGNOSIS — M542 Cervicalgia: Secondary | ICD-10-CM

## 2022-11-29 DIAGNOSIS — R269 Unspecified abnormalities of gait and mobility: Secondary | ICD-10-CM

## 2022-11-29 DIAGNOSIS — R262 Difficulty in walking, not elsewhere classified: Secondary | ICD-10-CM

## 2022-11-29 NOTE — Therapy (Signed)
OUTPATIENT PHYSICAL THERAPY NEURO TREATMENT    Patient Name: Sherry [name] MRN: [id number] DOB:[birth year], 88 y.o., female Today's Date: 11/29/2022   PCP: Earnestine Mealing, MD REFERRING PROVIDER: Deeann Saint   PT End of Session - 11/29/22 1151     Visit Number 81    Number of Visits 92    Date for PT Re-Evaluation 12/29/22    Progress Note Due on Visit 90    PT Start Time 1148    PT Stop Time 1221    PT Time Calculation (min) 33 min    Equipment Utilized During Treatment Gait belt    Behavior During Therapy WFL for tasks assessed/performed                   Past Medical History:  Diagnosis Date   Acute blood loss anemia    Arthritis    Cancer (HCC)    skin   Central cord syndrome at C6 level of cervical spinal cord (HCC) 11/29/2017   Hypertension    Protein-calorie malnutrition, severe (HCC) 01/24/2018   S/P insertion of IVC (inferior vena caval) filter 01/24/2018   Tetraparesis (HCC)    Past Surgical History:  Procedure Laterality Date   ANTERIOR CERVICAL DECOMP/DISCECTOMY FUSION N/A 11/29/2017   Procedure: Cervical five-six, six-seven Anterior Cervical Decompression Fusion;  Surgeon: Jadene Pierini, MD;  Location: [facility] OR;  Service: Neurosurgery;  Laterality: N/A;  Cervical five-six, six-seven Anterior Cervical Decompression Fusion   CATARACT EXTRACTION     FEMUR IM NAIL Left 08/16/2021   Procedure: INTRAMEDULLARY (IM) NAIL FEMORAL;  Surgeon: Deeann Saint, MD;  Location: [facility] ORS;  Service: Orthopedics;  Laterality: Left;   IR IVC FILTER PLMT / S&I /IMG GUID/MOD SED  12/08/2017   Patient Active Problem List   Diagnosis Date Noted   Age-related osteoporosis without current pathological fracture 07/27/2022   Mild recurrent major depression (HCC) 07/27/2022   Hypertensive heart disease with other congestive heart failure (HCC) 02/14/2022   Chronic indwelling Foley catheter 02/14/2022   Closed hip fracture (HCC) 08/15/2021   DVT (deep venous  thrombosis) (HCC) 08/15/2021   Quadriplegia (HCC) 08/15/2021   Fall 08/15/2021   Constipation due to slow transit 08/31/2018   Trauma 06/05/2018   Neuropathic pain 06/05/2018   Neurogenic bowel 06/05/2018   Vaginal yeast infection 01/30/2018   Healthcare-associated pneumonia 01/25/2018   Chronic allergic rhinitis 01/24/2018   Depression with anxiety 01/24/2018   UTI due to Klebsiella species 01/24/2018   Protein-calorie malnutrition, severe (HCC) 01/24/2018   S/P insertion of IVC (inferior vena caval) filter 01/24/2018   Tetraparesis (HCC) 01/20/2018   Neurogenic bladder 01/20/2018   Reactive depression    Benign essential HTN    Acute postoperative anemia due to expected blood loss    Central cord syndrome at C6 level of cervical spinal cord (HCC) 11/29/2017   Allergy to alpha-gal 11/25/2016   SCC (squamous cell carcinoma) 04/08/2014    ONSET DATE: 08/15/2021 (fall with B hip fx); Initial injury was in 2019- quadraplegia due to central cord syndrome of C6 secondary to neck fracture.   REFERRING DIAG: Bilateral Hip fx; Left Open- s/p Left IM nail femoral on 08/16/2021; Right closed  THERAPY DIAG:  Muscle weakness (generalized)  Other lack of coordination  Unsteadiness on feet  Cervicalgia  Repeated falls  Other abnormalities of gait and mobility  Difficulty in walking, not elsewhere classified  Abnormality of gait and mobility  Rationale for Evaluation and Treatment Rehabilitation  SUBJECTIVE:   SUBJECTIVE STATEMENT:  Patient reports no new issues since previous visit. Pt reported 0/10 on NPS.      Pt accompanied by:  Paid caregiver-   PERTINENT HISTORY: Sherry [name] is an 87yoF who experienced a fall at home on 08/15/2021 with hip fracture, s/p Left hip ORIF. PMH: quadriplegia due to central cord syndrome of C6 secondary to neck fracture, HTN, depression with anxiety, neurogenic bladder, bilateral DVT on Xarelto (s/p of IVC filter placement). Prior history of  significant PT/OT with improvement of function. Pt has full use of shoulder/elbows, limited use of hands, adaptive self feeding sucessful.     PAIN:  None current but does endorse some left thigh pain intermittent  OBJECTIVE:    TODAY'S TREATMENT:  11/29/22     Therapeutic exercises: (all performed from inside of // bars) Active Assisted Seated marches 1x15 each leg  Seated resisted abduction 1x15 each LE Seated LAQ 1x10 each LE   Therapeutic activities:   Static standing x 3 trials with 2 person assist - trials ranged from 1 minute to 3 minutes. Last trial was the longest.         PATIENT EDUCATION: Education details: cues for isolated joint exercises  HOME EXERCISE PROGRAM: No changes at this time  Access Code: [facility] URL: [email or web address] Date: 11/23/2021 Prepared by: Precious Bard Exercises - Seated Gluteal Sets - 2 x daily - 7 x weekly - 2 sets - 10 reps - 5 hold - Seated Quad Set - 2 x daily - 7 x weekly - 2 sets - 10 reps - 5 hold - Seated Long Arc Quad - 2 x daily - 7 x weekly - 2 sets - 10 reps - 5 hold - Seated March - 2 x daily - 7 x weekly - 2 sets - 10 reps - 5 hold - Seated Hip Abduction - 2 x daily - 7 x weekly - 2 sets - 10 reps - 5 hold - Seated Shoulder Shrugs - 2 x daily - 7 x weekly - 2 sets - 10 reps - 5 hold - Wheelchair Pressure Relief - 2 x daily - 7 x weekly - 2 sets - 10 reps - 5 hold  GOALS: Goals reviewed with patient? Yes  SHORT TERM GOALS: Target date: 02/22/2022  Pt will be independent with initial UE strengthening HEP in order to improve strength and balance in order to decrease fall risk and improve function at home and work. Baseline: 10/19/2021- patient with no formal UE HEP; 12/02/2021= Patient verbalized knowledge of HEP including use of theraband for UE strengthening.  Goal status: GOAL MET  LONG TERM GOALS: Target date: 12/29/2022  Pt will be independent with final for UE/LE HEP in order to improve strength and  balance in order to decrease fall risk and improve function at home and work. Baseline: Patient is currently BLE NWB and unable to participate in HEP. Has order for UE strengthening. 01/11/2022- Patient now able to participate in LE strengthening although NWB still- good understanding for some basic exercises. Will keep goal active to incorporate progressive LE strengthening exercises. 04/07/2022= Patient still participating in progressive LE seated HEP and has no questions at this time.  Goal status: MET  2.  Pt will improve FOTO to target score of 35  to display perceived improvements in ability to complete ADL's.  Baseline: 10/19/2021= 12; 12/02/2021= 17; 01/11/2022=17; 04/07/2022= 17; 07/05/2022=17; 10/06/2022- outcome measure not appropriate as patient in non-ambulatory and not consistently standing. Goal status: Goal not appropriate  3.  Pt will  increase strength of B UE  by at least 1/2 MMT grade in order to demonstrate improvement in strength and function  Baseline: patient has range of 2-/5 to 4/5 BUE Strength; 12/02/2021= 4/5 except with wrist ext. 01/11/2022= 4/5 B UE strength expept for wrist Goal status: Goal revised-no longer appropriate- working with OT on all UE strengthening.   4. Pt. Will increase strength of RLE by at least 1/2 MMT grade in order to demonstrate improvement in Standing/transfers.  Baseline: 2-/5 Right hip flex/knee ext/flex; 04/07/2022= 2- with right hip flex/knee ext (lacking 28 deg from zero) 4/8= Patient able to ext right knee lacking 18 deg from zero. 07/05/2022=Left knee approx 8 deg from zero and right knee = 23 deg from zero  Goal status: Progressing  5. Pt. Will demo ability to stand pivot transfer with max assist for improved functional mobility and less dependent need on mechanical device.   Baseline: Dependent on hoyer lift for all transfers; 04/07/2022- Unable to test secondary to patient with recent UTI and right hand procedure - will attempt to reassess next visit. ;  04/12/22: Unable to successfully stand due to feet plates on loaned power w/c; 05/10/2022- Patient still having to use rental chair and has not received her original power w/c back to practice SPT. 07/04/2021= Patient just received new power w/c and attempted standing from 1st time today- Patient able to stand with max assist today from w/c and did not attempt to perform pivot transfer- difficulty with static standing and placing weight on right LE. 10/06/2022- Not assessed today- patient needs new AFO prior to attempting transfers  Goal status: ONGOING  6. Pt. Will demonstrate improved functional LE strength as seen by ability to stand > 2 min for improved transfer ability and pregait abilities.   Baseline: not assessed today due to recent dx: UTI; 04/07/2022- Unable to test secondary to patient with recent UTI and right hand procedure - will attempt to reassess next visit; 04/12/22: Unable to successfully stand due to feet plates on loaned power w/c 05/10/2022- Patient still having to use rental chair and has not received her original power w/c back to practice Standing. 07/05/2022- Patient able to stand with max assist at trunk today for 2 min 3 sec. Will keep goal active to ensure consistency. 10/06/2022= Unable to assess today- patient with some recent blood pressure issues and needs new AFO. 11/24/2022= Patient demonstrated multiple rounds of standing in // bars- holding up to 1:45 min today but has been able to exhibit 2 min in recent past- working on consistency.   Goal status: ONGOING  ASSESSMENT:  CLINICAL IMPRESSION: Patient responded well to treatment today. With a 2 person assist, today's session was the longest the pt was able to static stand. Pt is demonstrating improvement with weightbearing in standing, but is still experiencing fatigue with static standing. Pt reported no pain throughout today's session. Pt will continue to benefit from skilled PT services to optimize independence and reduced caregiver  support.    OBJECTIVE IMPAIRMENTS Abnormal gait, decreased activity tolerance, decreased balance, decreased coordination, decreased endurance, decreased mobility, difficulty walking, decreased ROM, decreased strength, hypomobility, impaired flexibility, impaired UE functional use, postural dysfunction, and pain.   ACTIVITY LIMITATIONS carrying, lifting, bending, sitting, standing, squatting, sleeping, stairs, transfers, bed mobility, continence, bathing, toileting, dressing, self feeding, reach over head, hygiene/grooming, and caring for others  PARTICIPATION LIMITATIONS: meal prep, cleaning, laundry, medication management, personal finances, interpersonal relationship, driving, shopping, community activity, and yard work  PERSONAL FACTORS Age, Time  since onset of injury/illness/exacerbation, and 1-2 comorbidities: HTN, cervical Sx  are also affecting patient's functional outcome.   REHAB POTENTIAL: Good  CLINICAL DECISION MAKING: Evolving/moderate complexity  EVALUATION COMPLEXITY: Moderate  PLAN: PT FREQUENCY: 1-2x/week  PT DURATION: 12 weeks  PLANNED INTERVENTIONS: Therapeutic exercises, Therapeutic activity, Neuromuscular re-education, Balance training, Gait training, Patient/Family education, Self Care, Joint mobilization, DME instructions, Dry Needling, Electrical stimulation, Wheelchair mobility training, Spinal mobilization, Cryotherapy, Moist heat, and Manual therapy  PLAN FOR NEXT SESSION: strength, core training, Sit to stand and static standing; progress to SPT as able.    12:32 PM, 11/29/22 Louis Meckel, PT Physical Therapist - [facility]  Outpatient Physical Therapy- Main Campus [phone]

## 2022-11-29 NOTE — Therapy (Addendum)
Outpatient Occupational Therapy neuro Treatment Note  Patient Name: Sherry [name] MRN: [id number] DOB:1934/07/19, 88 y.o., female Today's Date: 11/29/2022  PCP:  Cindi Carbon PROVIDER: Earnestine Mealing, MD  END OF SESSION:  OT End of Session - 11/29/22 1322     Visit Number 88    Number of Visits 120    Date for OT Re-Evaluation 01/19/23    OT Start Time 1103    OT Stop Time 1145    OT Time Calculation (min) 42 min    Activity Tolerance Patient tolerated treatment well    Behavior During Therapy WFL for tasks assessed/performed             Past Medical History:  Diagnosis Date   Acute blood loss anemia    Arthritis    Cancer (HCC)    skin   Central cord syndrome at C6 level of cervical spinal cord (HCC) 11/29/2017   Hypertension    Protein-calorie malnutrition, severe (HCC) 01/24/2018   S/P insertion of IVC (inferior vena caval) filter 01/24/2018   Tetraparesis (HCC)    Past Surgical History:  Procedure Laterality Date   ANTERIOR CERVICAL DECOMP/DISCECTOMY FUSION N/A 11/29/2017   Procedure: Cervical five-six, six-seven Anterior Cervical Decompression Fusion;  Surgeon: Jadene Pierini, MD;  Location: [facility] OR;  Service: Neurosurgery;  Laterality: N/A;  Cervical five-six, six-seven Anterior Cervical Decompression Fusion   CATARACT EXTRACTION     FEMUR IM NAIL Left 08/16/2021   Procedure: INTRAMEDULLARY (IM) NAIL FEMORAL;  Surgeon: Deeann Saint, MD;  Location: [facility] ORS;  Service: Orthopedics;  Laterality: Left;   IR IVC FILTER PLMT / S&I /IMG GUID/MOD SED  12/08/2017   Patient Active Problem List   Diagnosis Date Noted   Age-related osteoporosis without current pathological fracture 07/27/2022   Mild recurrent major depression (HCC) 07/27/2022   Hypertensive heart disease with other congestive heart failure (HCC) 02/14/2022   Chronic indwelling Foley catheter 02/14/2022   Closed hip fracture (HCC) 08/15/2021   DVT (deep venous thrombosis) (HCC) 08/15/2021    Quadriplegia (HCC) 08/15/2021   Fall 08/15/2021   Constipation due to slow transit 08/31/2018   Trauma 06/05/2018   Neuropathic pain 06/05/2018   Neurogenic bowel 06/05/2018   Vaginal yeast infection 01/30/2018   Healthcare-associated pneumonia 01/25/2018   Chronic allergic rhinitis 01/24/2018   Depression with anxiety 01/24/2018   UTI due to Klebsiella species 01/24/2018   Protein-calorie malnutrition, severe (HCC) 01/24/2018   S/P insertion of IVC (inferior vena caval) filter 01/24/2018   Tetraparesis (HCC) 01/20/2018   Neurogenic bladder 01/20/2018   Reactive depression    Benign essential HTN    Acute postoperative anemia due to expected blood loss    Central cord syndrome at C6 level of cervical spinal cord (HCC) 11/29/2017   Allergy to alpha-gal 11/25/2016   SCC (squamous cell carcinoma) 04/08/2014    ONSET DATE: 11/29/2017  REFERRING DIAG: Central Cord Syndrome at C6 of the Cervical Spinal Cord, Fall with Bilateral Closed Hip Fractures, with ORIF repair with Intramedullary Nailing of the Left Hip Fracture.   THERAPY DIAG:  Muscle weakness (generalized)  Other lack of coordination  Rationale for Evaluation and Treatment: Rehabilitation  SUBJECTIVE:   SUBJECTIVE STATEMENT: Pt.  Reports doing well since having her flu shot last week.  Pt accompanied by: self, Personal Care aide  PERTINENT HISTORY: Central Cord Syndrome at C6 of the Cervical Spinal Cord, Fall with Bilateral Closed Hip Fractures, with ORIF repair with Intramedullary Nailing of the Left Hip Fracture.   PRECAUTIONS: None  WEIGHT BEARING RESTRICTIONS: No  PAIN:  Are you having pain? No   LIVING ENVIRONMENT: Lives with: [facility]  Stairs: No Has following equipment at home: Wheelchair (power)  PLOF: Independent   PATIENT GOALS:  See below for established goals  OBJECTIVE:  Note: Objective measures were completed at Evaluation unless otherwise noted.  HAND DOMINANCE:  Right  ADLs: Caregiver assist with ADLs, and [facility]  MOBILITY STATUS: Uses a power w/c  ACTIVITY TOLERANCE: Activity tolerance: Fair  FUNCTIONAL OUTCOME MEASURES:  Measurements:   12/09/2021:   Shoulder flexion: R: 120(136), L: 138(150) Shoulder abduction: R: 108(120), L: 110(120) Elbow: R: 0-148, L: 0-146 Wrist flexion: R: 55(62), L: 78 Writ extension: R: 34(55), L: 3(4) RD: R: 12(20), L: 16(26) UD: R: 8(24), L: 6(18) Thumb radial abduction: R: 11(20), L: 8(20) Digit flexion to the [facility]: R:  2nd: 5cm(3.5cm), 3rd: 7.5 cm(5cm), 4th: 7cm(3cm), 5th: 6cm(5.5cm) L: 2nd: 8cm(8cm), 3rd: 8cm(8cm), 4th: 7.5cm(7cm), 5th: 7cm(7cm)   02/03/2022:   Shoulder flexion: R: 122(138), L: 148(154) Shoulder abduction: R: 109(125), L: 125(125 Elbow: R: 0-148, L: 0-146 Wrist flexion: R: 60(68), L: 79 Writ extension: R: 40(55), L: 3(10) RD: R: 14(24), L: 20(28) UD: R: 8(24), L: 6(18) Thumb radial abduction: R: 20(25), L: 8(20) Digit flexion to the [facility]: R:  2nd: 4.5cm(3cm), 3rd: 6.5cm(4.5cm), 4th: 6.5 cm(3cm), 5th: 4.5cm(5cm) L: 2nd: 8cm(8cm), 3rd: 8cm(7cm), 4th: 7.5cm(7cm), 5th: 6.5cm(6.5cm)   04/07/2022:   Shoulder flexion: R: 125(150), L: 160(160) Shoulder abduction: R: 109(125), L: 125(125 Elbow: R: 0-148, L: 0-146 Wrist flexion: R: 60(68), L: 79 Writ extension: R: 40(55), L: 5(18) RD: R: 14(24), L: 20(28) UD: R: 18(24), L: 8(18) Thumb radial abduction: R: 20(25), L: 8(20) Digit flexion to the [facility]: R:  2nd: 4.5 cm(4 cm), 3rd: 6.5cm(3cm), 4th: 7 cm (5cm), 5th: 5cm(5cm) L: 2nd: 8cm(8cm), 3rd: 8cm(7cm), 4th: 7.5cm(7cm), 5th: 7cm(7cm)   07/05/2022:   Shoulder flexion: R: 125(154), L: 160(160) Shoulder abduction: R: 95(130), L: 143(143) Elbow: R: 0-148, L: 0-146 Wrist flexion: R: 60(68), L: 79 Writ extension: R: 40(60), L: 0(12) RD: R: 10(24), L: 12(20) UD: R: 16(24), L: 8(16) Thumb radial abduction: R: 20(25), L: 8(20) Digit flexion to the [facility]: R:  2nd: 4 cm(4cm), 3rd:  6.5cm( 3.5cm), 4th: 7 cm (5cm), 5th: 6cm(5cm) L: 2nd: 8cm(8cm), 3rd: 8cm(7cm), 4th: 8cm(7cm), 5th: 7cm(7cm)   09/13/2022:   Shoulder flexion: R: 125(154), L: 160(160) Shoulder abduction: R: 100(130), L: 143(143) Elbow: R: 0-150, L: 0-150 Wrist flexion: R: 55(68), L: 79 Writ extension: R: 45(60), L: 0(12) RD: R: 10(24), L: 12(20) UD: R: 16(24), L: 8(16) Thumb radial abduction: R: 20(25), L: 8(20) Digit flexion to the [facility]: R:  2nd: 4 cm(4cm), 3rd: 6cm( 5cm), 4th: 6 cm (4cm), 5th: 5cm(5cm) L: 2nd: 7cm(cm), 3rd: 7cm(6cm), 4th: 7cm(6cm), 5th: 6cm(6cm)   10/27/2022   Shoulder flexion: R: 128(154), L: 160(160) Shoulder abduction: R: 105(130), L: 143(143) Elbow: R: 0-150, L: 0-150 Wrist flexion: R: 55(68), L: 79 Writ extension: R: 46(64), L: 0(20) RD: R: 10(20), L: 18(24) UD: R: 118(28), L: 8(20) Thumb radial abduction: R: 30(42), L:20(24) Digit flexion to the [facility]: R:  2nd: 4 cm(3cm), 3rd: 6cm( 6cm), 4th: 6 cm (5cm), 5th: 5cm(5cm) L: 2nd: 8cm(8cm), 3rd: 8cm(6cm), 4th: 8cm(7cm), 5th: 6cm(6cm)      COORDINATION:  Impaired 2/2 bilateral hand digit MP, PIP, and DIP extension tightness  SENSATION: Intact  EDEMA: N/A   COGNITION: Overall cognitive status: WNL   VISION: Subjective report: Wears glasses, No  changes in vision.   PERCEPTION: Intact  TODAY'S TREATMENT:                                                                                                                              DATE: 11/29/2022   Manual Therapy:   Pt. tolerated soft tissue massage to the volar, and dorsal surface of each digit on the bilateral hands, and thenar eminence secondary to stiffness following moist heat modality. Soft tissue mobilization was performed for carpal, and metacarpal spread stretches. Manual therapy was performed independent of, and in preparation for therapeutic Ex.   Therapeutic Ex.    Pt. tolerated PROM followed by AROM bilateral wrist extension, PROM for bilateral digit MP,  PIP, and DIP flexion, and extension, thumb radial, and palmar abduction. Emphasis was placed on left wrist extension with moist heat modality 2/2 tightness. Pt. performed 2# dowel ex. for UE strengthening secondary to weakness. Bilateral shoulder flexion, chest presses were performed.  1-2 sets 10 reps  PATIENT EDUCATION: Education details: Bilateral hand function, ROM, and strengthening Person educated: Patient Education method: Explanation, Demonstration, and Verbal cues Education comprehension: verbalized understanding and returned demonstration  HOME EXERCISE PROGRAM: Continue to assess HEP needs, and provide as needed.    GOALS: Goals reviewed with patient? Yes    LONG TERM GOALS: Target date: 01/19/2023  3.  Patient will demonstrate improved composite finger flexion to be able to firmly hold and use adaptive devices during ADLs, and IADLs.  Baseline: 10/27/2022: Pt. presents with increased MP, PIP, and DIP digit tightness/stiffness limiting the formulation of bilateral composites fists during this progress reporting period limiting. Digit flexion to the [facility]: R:  2nd: 4 cm(3cm), 3rd: 6cm( 6cm), 4th: 6 cm (5cm), 5th: 5cm(5cm), L: 2nd: 8cm(8cm), 3rd: 8cm(6cm), 4th: 8cm(7cm), 5th: 6cm(6cm) 09/20/2022: Improving digit flexion to the [facility]. 09/13/22: improved digit flexion to the [facility]. 07/05/2022: Pt. presents with digit MP, PIP, and DIP extensor tightness limiting her ability to securely grip objects in her bilateral hands.  04/07/2022: Pt. Has improved with right 2nd, and 3rd digit flexion towards the [facility]. Pt. Continues to have difficulty securely holding and applying deodorant.02/03/2022: Pt. Has improved with digit flexion, however, continues to have difficulty securely holding and using deodorant. 12/09/2021: Bilateral hand/digit MP, PIP, and DIP extension tightness limits her ability achieve digit flexion to hold, and apply deodorant. 10/28/2021:  Pt. continues to have difficulty holding the  deodorant. 01/12/2021: Pt. Presents with limited digit extension. Pt. Is able to initiate holding deodorant, however is unable to hold it while using it  Goal status: Ongoing   4.  Pt. Will improve bilateral wrist extension in preparation for anticipating, and initiating reaching for objects at the table.  Baseline: 10/27/2022: Pt. Is improving with bilateral wrist extension. R: 46(64) L: 0(20) 09/20/2022: limited PROM in the Left wrist extension 2/2 tightness/stiffness. 09/13/22: R: 55(68), L: 0(12) 07/19/2022: Bilateral wrist extension in limited. 07/05/2022: Right: 40(60) Left: 0(12) 04/07/2022: Right: 40(55)  Left: 5(18) 02/03/2022: Right: 34(55) Left: 3(4) 12/09/2021: Right  34(55)  Left  3(4) 10/28/2021:  Right 22(38), Left 0(15) 09/14/2021:  Right: 17(35), left 2(15)  Goal status: Ongoing  7.  Pt. Will increase bilateral lateral pinch strength by 2 lbs to be able to securely grasp items during ADLs, and IADL tasks.  Baseline: 10/27/2022: Pt. Is improving with holding items with her bilateral thumbs. (Pinch meter out for calibration) 09/20/2022: lateral pinch continues to be limited 09/13/22: R 6.5# L 4# 07/19/2022: TBD 07/05/2022: TBD 07/2022: NT-Pinch meter out for calibration. 02/03/2022: Right: 6#, Left: 4# 12/09/2021: Right: 6#, Left: 4#  Goal status: Ongoing   9.  Pt. will complete plant care with modified independence.  Baseline: 10/27/2022: Pt. Is able to water plants that are closer, and within reach. Pt. continues to have difficulty reaching for thorough plant care. 09/20/2022: Pt. Continues to have difficulty reaching for thorough plant care. 09/13/22: continues to report intermittent difficulty 07/19/2022: Pt. Continues to be able to water, and care for some of her plants. Pt. Has more difficulty with plants that are harder to reach. 07/05/2022: pt. Is able to water, and care for some of her plants. Pt. Has more difficulty with plants that are harder to reach. 04/07/2022: Pt. is now able to hold a cup and  water her plants.02/03/2022: Pt. has difficulty caring for her plants.  Goal status: Ongoing   10.  Pt. will demonstrate adaptive techniques to assist with the efficiency of self-dressing, or morning care tasks.  Baseline: 10/27/2022: Pt. is able to assist with initiating UE dressing. Pt. requires assist from personal, and staff care aides. 09/20/2022: Pt. continues to require assist form personal, and staff care aides. 09/13/22: MODA dressing, reports she is often rushed 07/19/2022: Pt. continues to require assist with self-dressing/morning care tasks. 07/05/2022: Continue with goal. 04/07/2022: Pt. Continues to require assist with the efficiency of self-dressing, and morning care tasks.02/03/2022: Pt. requires assist from caregivers 2/2 time limitations during morning care.  Goal status: Ongoing INITIAL  11.  Pt. will improve BUE strength to improve ADL, and IADL functioning.  Baseline: 10/27/2022: shoulder flexion L 4/5, R 3+/5; shoulder abduction L 4+/5, R 3+/5. elbow flexion B 5/5, elbow extension 4/5 09/20/2022: Pt. Presents with limited BUE strength 09/13/22: shoulder flexion L 4/5, R 3/5; elbow flexion B 5/5, elbow extension 4-/5; shoulder abduction L 4+/5, R 3+/5. 07/19/2022: BUE strength continues to be limited. 07/05/2022: shoulder flexion: right 4-/5, abduction: 3+/5, elbow flexion: right: 5/5, left 5/5, extension: right: 4-/5, left 4-/5, wrist extension: right: 3-/5, left: 2-/5  Goal status: Ongoing  12.  Pt. will improve bilateral thumb radial abduction in order to be able to hold the grab bars while standing with PT  Baseline: 10/27/2022: thumb radial abduction: R: 30(42), L: 20(24  Goal status: Initial/New  13.  Pt. Will be able to securely hold, and stabilize medication bottles at a tabletop surface when opening, and closing them.  Baseline: 10/27/2022: Pt. Stabilizes bottles against her torso when attempting to open, and close them   Goal status: Initial/New    ASSESSMENT:  CLINICAL  IMPRESSION:  Pt. continues to tolerate the BUE dowel exercises with 2#, with cues for visual demonstration of proper technique. Pt. Is able to elevate her BUEs further with the dowel. Pt. continues to present with tightness at the bilateral thumb webspace, and bilateral wrist flexor tightness, as well as digit MP, PIP, and DIP extensor tightness which continues to limit her ability to complete ADL tasks efficiently.  Pt. continues to benefit from working on impoving ROM, and UE strength in order to work towards increasing bilateral hand grasp on objects, and increasing engagement of bilateral hands during ADLs, and IADL tasks.    PERFORMANCE DEFICITS: in functional skills including ADLs, IADLs, coordination, dexterity, ROM, strength, and UE functional use, cognitive skills including , and psychosocial skills including coping strategies and environmental adaptation.   IMPAIRMENTS: are limiting patient from ADLs, IADLs, and leisure.   CO-MORBIDITIES: may have co-morbidities  that affects occupational performance. Patient will benefit from skilled OT to address above impairments and improve overall function.  MODIFICATION OR ASSISTANCE TO COMPLETE EVALUATION: Min-Moderate modification of tasks or assist with assess necessary to complete an evaluation.  OT OCCUPATIONAL PROFILE AND HISTORY: Detailed assessment: Review of records and additional review of physical, cognitive, psychosocial history related to current functional performance.  CLINICAL DECISION MAKING: Moderate - several treatment options, min-mod task modification necessary  REHAB POTENTIAL: Good for stated goals    PLAN:  OT FREQUENCY 2x's a week  OT DURATION: 12 weeks  PLANNED INTERVENTIONS ADL training, A/E training, UE ther. Ex, Manual therapy, neuromuscular re-education, moist heat modality, Paraffin Bath, Splinting, and  Pt./caregiver education   RECOMMENDED OTHER SERVICES: PT  CONSULTED AND AGREED WITH PLAN OF CARE:  Patient  PLAN FOR NEXT SESSION: Continue Treatment as per established POC   Olegario Messier, MS, OTR/L 11/29/2022, 2:16 PM

## 2022-12-01 ENCOUNTER — Ambulatory Visit: Payer: Medicare HMO | Admitting: Occupational Therapy

## 2022-12-01 ENCOUNTER — Ambulatory Visit: Payer: Medicare HMO

## 2022-12-01 DIAGNOSIS — M542 Cervicalgia: Secondary | ICD-10-CM

## 2022-12-01 DIAGNOSIS — R269 Unspecified abnormalities of gait and mobility: Secondary | ICD-10-CM

## 2022-12-01 DIAGNOSIS — S72001A Fracture of unspecified part of neck of right femur, initial encounter for closed fracture: Secondary | ICD-10-CM

## 2022-12-01 DIAGNOSIS — M6281 Muscle weakness (generalized): Secondary | ICD-10-CM | POA: Diagnosis not present

## 2022-12-01 DIAGNOSIS — R296 Repeated falls: Secondary | ICD-10-CM

## 2022-12-01 DIAGNOSIS — S14129S Central cord syndrome at unspecified level of cervical spinal cord, sequela: Secondary | ICD-10-CM

## 2022-12-01 DIAGNOSIS — R2689 Other abnormalities of gait and mobility: Secondary | ICD-10-CM

## 2022-12-01 DIAGNOSIS — R278 Other lack of coordination: Secondary | ICD-10-CM

## 2022-12-01 DIAGNOSIS — R2681 Unsteadiness on feet: Secondary | ICD-10-CM

## 2022-12-01 DIAGNOSIS — R262 Difficulty in walking, not elsewhere classified: Secondary | ICD-10-CM

## 2022-12-01 NOTE — Therapy (Signed)
Outpatient Occupational Therapy Neuro Treatment Note  Patient Name: Sherry [name] MRN: [id number] DOB:11/11/1934, 88 y.o., female Today's Date: 11/29/2022  PCP:  Cindi Carbon PROVIDER: Earnestine Mealing, MD  END OF SESSION:   OT End of Session - 12/01/22 1156     Visit Number [id number]    Number of Visits 120    Date for OT Re-Evaluation 01/19/23    OT Start Time 1103    OT Stop Time 1143    OT Time Calculation (min) 40 min    Activity Tolerance Patient tolerated treatment well    Behavior During Therapy WFL for tasks assessed/performed                 Past Medical History:  Diagnosis Date   Acute blood loss anemia    Arthritis    Cancer (HCC)    skin   Central cord syndrome at C6 level of cervical spinal cord (HCC) 11/29/2017   Hypertension    Protein-calorie malnutrition, severe (HCC) 01/24/2018   S/P insertion of IVC (inferior vena caval) filter 01/24/2018   Tetraparesis (HCC)    Past Surgical History:  Procedure Laterality Date   ANTERIOR CERVICAL DECOMP/DISCECTOMY FUSION N/A 11/29/2017   Procedure: Cervical five-six, six-seven Anterior Cervical Decompression Fusion;  Surgeon: Jadene Pierini, MD;  Location: [facility] OR;  Service: Neurosurgery;  Laterality: N/A;  Cervical five-six, six-seven Anterior Cervical Decompression Fusion   CATARACT EXTRACTION     FEMUR IM NAIL Left 08/16/2021   Procedure: INTRAMEDULLARY (IM) NAIL FEMORAL;  Surgeon: Deeann Saint, MD;  Location: [facility] ORS;  Service: Orthopedics;  Laterality: Left;   IR IVC FILTER PLMT / S&I /IMG GUID/MOD SED  12/08/2017   Patient Active Problem List   Diagnosis Date Noted   Age-related osteoporosis without current pathological fracture 07/27/2022   Mild recurrent major depression (HCC) 07/27/2022   Hypertensive heart disease with other congestive heart failure (HCC) 02/14/2022   Chronic indwelling Foley catheter 02/14/2022   Closed hip fracture (HCC) 08/15/2021   DVT (deep venous thrombosis) (HCC)  08/15/2021   Quadriplegia (HCC) 08/15/2021   Fall 08/15/2021   Constipation due to slow transit 08/31/2018   Trauma 06/05/2018   Neuropathic pain 06/05/2018   Neurogenic bowel 06/05/2018   Vaginal yeast infection 01/30/2018   Healthcare-associated pneumonia 01/25/2018   Chronic allergic rhinitis 01/24/2018   Depression with anxiety 01/24/2018   UTI due to Klebsiella species 01/24/2018   Protein-calorie malnutrition, severe (HCC) 01/24/2018   S/P insertion of IVC (inferior vena caval) filter 01/24/2018   Tetraparesis (HCC) 01/20/2018   Neurogenic bladder 01/20/2018   Reactive depression    Benign essential HTN    Acute postoperative anemia due to expected blood loss    Central cord syndrome at C6 level of cervical spinal cord (HCC) 11/29/2017   Allergy to alpha-gal 11/25/2016   SCC (squamous cell carcinoma) 04/08/2014    ONSET DATE: 11/29/2017  REFERRING DIAG: Central Cord Syndrome at C6 of the Cervical Spinal Cord, Fall with Bilateral Closed Hip Fractures, with ORIF repair with Intramedullary Nailing of the Left Hip Fracture.   THERAPY DIAG:  Muscle weakness (generalized)  Other lack of coordination  Rationale for Evaluation and Treatment: Rehabilitation  SUBJECTIVE:   Pt. reports doing well today.  SUBJECTIVE STATEMENT: Pt.  Reports doing well since having her flu shot last week.  Pt accompanied by: self, Personal Care aide  PERTINENT HISTORY: Central Cord Syndrome at C6 of the Cervical Spinal Cord, Fall with Bilateral Closed Hip Fractures, with ORIF repair with  Intramedullary Nailing of the Left Hip Fracture.   PRECAUTIONS: None  WEIGHT BEARING RESTRICTIONS: No  PAIN:  Are you having pain? No   LIVING ENVIRONMENT: Lives with: [facility]  Stairs: No Has following equipment at home: Wheelchair (power)  PLOF: Independent   PATIENT GOALS:  See below for established goals  OBJECTIVE:  Note: Objective measures were completed at Evaluation  unless otherwise noted.  HAND DOMINANCE: Right  ADLs: Caregiver assist with ADLs, and [facility]  MOBILITY STATUS: Uses a power w/c  ACTIVITY TOLERANCE: Activity tolerance: Fair  FUNCTIONAL OUTCOME MEASURES:  Measurements:   12/09/2021:   Shoulder flexion: R: 120(136), L: 138(150) Shoulder abduction: R: 108(120), L: 110(120) Elbow: R: 0-148, L: 0-146 Wrist flexion: R: 55(62), L: 78 Writ extension: R: 34(55), L: 3(4) RD: R: 12(20), L: 16(26) UD: R: 8(24), L: 6(18) Thumb radial abduction: R: 11(20), L: 8(20) Digit flexion to the [facility]: R:  2nd: 5cm(3.5cm), 3rd: 7.5 cm(5cm), 4th: 7cm(3cm), 5th: 6cm(5.5cm) L: 2nd: 8cm(8cm), 3rd: 8cm(8cm), 4th: 7.5cm(7cm), 5th: 7cm(7cm)   02/03/2022:   Shoulder flexion: R: 122(138), L: 148(154) Shoulder abduction: R: 109(125), L: 125(125 Elbow: R: 0-148, L: 0-146 Wrist flexion: R: 60(68), L: 79 Writ extension: R: 40(55), L: 3(10) RD: R: 14(24), L: 20(28) UD: R: 8(24), L: 6(18) Thumb radial abduction: R: 20(25), L: 8(20) Digit flexion to the [facility]: R:  2nd: 4.5cm(3cm), 3rd: 6.5cm(4.5cm), 4th: 6.5 cm(3cm), 5th: 4.5cm(5cm) L: 2nd: 8cm(8cm), 3rd: 8cm(7cm), 4th: 7.5cm(7cm), 5th: 6.5cm(6.5cm)   04/07/2022:   Shoulder flexion: R: 125(150), L: 160(160) Shoulder abduction: R: 109(125), L: 125(125 Elbow: R: 0-148, L: 0-146 Wrist flexion: R: 60(68), L: 79 Writ extension: R: 40(55), L: 5(18) RD: R: 14(24), L: 20(28) UD: R: 18(24), L: 8(18) Thumb radial abduction: R: 20(25), L: 8(20) Digit flexion to the [facility]: R:  2nd: 4.5 cm(4 cm), 3rd: 6.5cm(3cm), 4th: 7 cm (5cm), 5th: 5cm(5cm) L: 2nd: 8cm(8cm), 3rd: 8cm(7cm), 4th: 7.5cm(7cm), 5th: 7cm(7cm)   07/05/2022:   Shoulder flexion: R: 125(154), L: 160(160) Shoulder abduction: R: 95(130), L: 143(143) Elbow: R: 0-148, L: 0-146 Wrist flexion: R: 60(68), L: 79 Writ extension: R: 40(60), L: 0(12) RD: R: 10(24), L: 12(20) UD: R: 16(24), L: 8(16) Thumb radial abduction: R: 20(25), L: 8(20) Digit  flexion to the [facility]: R:  2nd: 4 cm(4cm), 3rd: 6.5cm( 3.5cm), 4th: 7 cm (5cm), 5th: 6cm(5cm) L: 2nd: 8cm(8cm), 3rd: 8cm(7cm), 4th: 8cm(7cm), 5th: 7cm(7cm)   09/13/2022:   Shoulder flexion: R: 125(154), L: 160(160) Shoulder abduction: R: 100(130), L: 143(143) Elbow: R: 0-150, L: 0-150 Wrist flexion: R: 55(68), L: 79 Writ extension: R: 45(60), L: 0(12) RD: R: 10(24), L: 12(20) UD: R: 16(24), L: 8(16) Thumb radial abduction: R: 20(25), L: 8(20) Digit flexion to the [facility]: R:  2nd: 4 cm(4cm), 3rd: 6cm( 5cm), 4th: 6 cm (4cm), 5th: 5cm(5cm) L: 2nd: 7cm(cm), 3rd: 7cm(6cm), 4th: 7cm(6cm), 5th: 6cm(6cm)   10/27/2022   Shoulder flexion: R: 128(154), L: 160(160) Shoulder abduction: R: 105(130), L: 143(143) Elbow: R: 0-150, L: 0-150 Wrist flexion: R: 55(68), L: 79 Writ extension: R: 46(64), L: 0(20) RD: R: 10(20), L: 18(24) UD: R: 118(28), L: 8(20) Thumb radial abduction: R: 30(42), L:20(24) Digit flexion to the [facility]: R:  2nd: 4 cm(3cm), 3rd: 6cm( 6cm), 4th: 6 cm (5cm), 5th: 5cm(5cm) L: 2nd: 8cm(8cm), 3rd: 8cm(6cm), 4th: 8cm(7cm), 5th: 6cm(6cm)      COORDINATION:  Impaired 2/2 bilateral hand digit MP, PIP, and DIP extension tightness  SENSATION: Intact  EDEMA: N/A   COGNITION:  Overall cognitive status: WNL   VISION: Subjective report: Wears glasses, No changes in vision.   PERCEPTION: Intact  TODAY'S TREATMENT:                                                                                                                              DATE: 12/01/2022   Manual Therapy:   Pt. tolerated soft tissue massage to the volar, and dorsal surface of each digit on the bilateral hands, and thenar eminence secondary to stiffness following moist heat modality. Soft tissue mobilization was performed for carpal, and metacarpal spread stretches. Manual therapy was performed independent of, and in preparation for therapeutic Ex.   Therapeutic Ex.    Pt. tolerated PROM followed by AROM bilateral  wrist extension, PROM for bilateral digit MP, PIP, and DIP flexion, and extension, thumb radial, and palmar abduction. Emphasis was placed on left wrist extension with moist heat modality 2/2 tightness. Pt. performed 2.5# dowel ex. for UE strengthening secondary to weakness. Bilateral shoulder flexion, chest presses were performed.  1-2 sets 10 reps  PATIENT EDUCATION: Education details: Bilateral hand function, ROM, and strengthening Person educated: Patient Education method: Explanation, Demonstration, and Verbal cues Education comprehension: verbalized understanding and returned demonstration  HOME EXERCISE PROGRAM: Continue to assess HEP needs, and provide as needed.    GOALS: Goals reviewed with patient? Yes    LONG TERM GOALS: Target date: 01/19/2023  3.  Patient will demonstrate improved composite finger flexion to be able to firmly hold and use adaptive devices during ADLs, and IADLs.  Baseline: 10/27/2022: Pt. presents with increased MP, PIP, and DIP digit tightness/stiffness limiting the formulation of bilateral composites fists during this progress reporting period limiting. Digit flexion to the [facility]: R:  2nd: 4 cm(3cm), 3rd: 6cm( 6cm), 4th: 6 cm (5cm), 5th: 5cm(5cm), L: 2nd: 8cm(8cm), 3rd: 8cm(6cm), 4th: 8cm(7cm), 5th: 6cm(6cm) 09/20/2022: Improving digit flexion to the [facility]. 09/13/22: improved digit flexion to the [facility]. 07/05/2022: Pt. presents with digit MP, PIP, and DIP extensor tightness limiting her ability to securely grip objects in her bilateral hands.  04/07/2022: Pt. Has improved with right 2nd, and 3rd digit flexion towards the [facility]. Pt. Continues to have difficulty securely holding and applying deodorant.02/03/2022: Pt. Has improved with digit flexion, however, continues to have difficulty securely holding and using deodorant. 12/09/2021: Bilateral hand/digit MP, PIP, and DIP extension tightness limits her ability achieve digit flexion to hold, and apply deodorant. 10/28/2021:  Pt.  continues to have difficulty holding the deodorant. 01/12/2021: Pt. Presents with limited digit extension. Pt. Is able to initiate holding deodorant, however is unable to hold it while using it  Goal status: Ongoing   4.  Pt. Will improve bilateral wrist extension in preparation for anticipating, and initiating reaching for objects at the table.  Baseline: 10/27/2022: Pt. Is improving with bilateral wrist extension. R: 46(64) L: 0(20) 09/20/2022: limited PROM in the Left wrist extension 2/2 tightness/stiffness. 09/13/22: R: 55(68), L: 0(12) 07/19/2022: Bilateral  wrist extension in limited. 07/05/2022: Right: 40(60) Left: 0(12) 04/07/2022: Right: 40(55) Left: 5(18) 02/03/2022: Right: 34(55) Left: 3(4) 12/09/2021: Right  34(55)  Left  3(4) 10/28/2021:  Right 22(38), Left 0(15) 09/14/2021:  Right: 17(35), left 2(15)  Goal status: Ongoing  7.  Pt. Will increase bilateral lateral pinch strength by 2 lbs to be able to securely grasp items during ADLs, and IADL tasks.  Baseline: 10/27/2022: Pt. Is improving with holding items with her bilateral thumbs. (Pinch meter out for calibration) 09/20/2022: lateral pinch continues to be limited 09/13/22: R 6.5# L 4# 07/19/2022: TBD 07/05/2022: TBD 07/2022: NT-Pinch meter out for calibration. 02/03/2022: Right: 6#, Left: 4# 12/09/2021: Right: 6#, Left: 4#  Goal status: Ongoing   9.  Pt. will complete plant care with modified independence.  Baseline: 10/27/2022: Pt. Is able to water plants that are closer, and within reach. Pt. continues to have difficulty reaching for thorough plant care. 09/20/2022: Pt. Continues to have difficulty reaching for thorough plant care. 09/13/22: continues to report intermittent difficulty 07/19/2022: Pt. Continues to be able to water, and care for some of her plants. Pt. Has more difficulty with plants that are harder to reach. 07/05/2022: pt. Is able to water, and care for some of her plants. Pt. Has more difficulty with plants that are harder to reach.  04/07/2022: Pt. is now able to hold a cup and water her plants.02/03/2022: Pt. has difficulty caring for her plants.  Goal status: Ongoing   10.  Pt. will demonstrate adaptive techniques to assist with the efficiency of self-dressing, or morning care tasks.  Baseline: 10/27/2022: Pt. is able to assist with initiating UE dressing. Pt. requires assist from personal, and staff care aides. 09/20/2022: Pt. continues to require assist form personal, and staff care aides. 09/13/22: MODA dressing, reports she is often rushed 07/19/2022: Pt. continues to require assist with self-dressing/morning care tasks. 07/05/2022: Continue with goal. 04/07/2022: Pt. Continues to require assist with the efficiency of self-dressing, and morning care tasks.02/03/2022: Pt. requires assist from caregivers 2/2 time limitations during morning care.  Goal status: Ongoing INITIAL  11.  Pt. will improve BUE strength to improve ADL, and IADL functioning.  Baseline: 10/27/2022: shoulder flexion L 4/5, R 3+/5; shoulder abduction L 4+/5, R 3+/5. elbow flexion B 5/5, elbow extension 4/5 09/20/2022: Pt. Presents with limited BUE strength 09/13/22: shoulder flexion L 4/5, R 3/5; elbow flexion B 5/5, elbow extension 4-/5; shoulder abduction L 4+/5, R 3+/5. 07/19/2022: BUE strength continues to be limited. 07/05/2022: shoulder flexion: right 4-/5, abduction: 3+/5, elbow flexion: right: 5/5, left 5/5, extension: right: 4-/5, left 4-/5, wrist extension: right: 3-/5, left: 2-/5  Goal status: Ongoing  12.  Pt. will improve bilateral thumb radial abduction in order to be able to hold the grab bars while standing with PT  Baseline: 10/27/2022: thumb radial abduction: R: 30(42), L: 20(24  Goal status: Initial/New  13.  Pt. Will be able to securely hold, and stabilize medication bottles at a tabletop surface when opening, and closing them.  Baseline: 10/27/2022: Pt. Stabilizes bottles against her torso when attempting to open, and close them   Goal status:  Initial/New    ASSESSMENT:  CLINICAL IMPRESSION:  Pt. continues to tolerate the BUE dowel exercises with weight resistance increased to 2.5#, with cues for visual demonstration of proper technique. Pt. Continues to be able to elevate her BUEs further with the dowel. Pt. continues to present with tightness at the bilateral thumb webspace, and bilateral wrist flexor tightness, as well as  digit MP, PIP, and DIP extensor tightness which continues to limit her ability to complete ADL tasks efficiently. Pt. continues to benefit from working on impoving ROM, and UE strength in order to work towards increasing bilateral hand grasp on objects, and increasing engagement of bilateral hands during ADLs, and IADL tasks.    PERFORMANCE DEFICITS: in functional skills including ADLs, IADLs, coordination, dexterity, ROM, strength, and UE functional use, cognitive skills including , and psychosocial skills including coping strategies and environmental adaptation.   IMPAIRMENTS: are limiting patient from ADLs, IADLs, and leisure.   CO-MORBIDITIES: may have co-morbidities  that affects occupational performance. Patient will benefit from skilled OT to address above impairments and improve overall function.  MODIFICATION OR ASSISTANCE TO COMPLETE EVALUATION: Min-Moderate modification of tasks or assist with assess necessary to complete an evaluation.  OT OCCUPATIONAL PROFILE AND HISTORY: Detailed assessment: Review of records and additional review of physical, cognitive, psychosocial history related to current functional performance.  CLINICAL DECISION MAKING: Moderate - several treatment options, min-mod task modification necessary  REHAB POTENTIAL: Good for stated goals    PLAN:  OT FREQUENCY 2x's a week  OT DURATION: 12 weeks  PLANNED INTERVENTIONS ADL training, A/E training, UE ther. Ex, Manual therapy, neuromuscular re-education, moist heat modality, Paraffin Bath, Splinting, and  Pt./caregiver  education   RECOMMENDED OTHER SERVICES: PT  CONSULTED AND AGREED WITH PLAN OF CARE: Patient  PLAN FOR NEXT SESSION: Continue Treatment as per established POC   Olegario Messier, MS, OTR/L 12/01/2022, 2:16 PM

## 2022-12-02 NOTE — Therapy (Signed)
OUTPATIENT PHYSICAL THERAPY NEURO TREATMENT    Patient Name: Sherry [name] MRN: [id number] DOB:10-26-1934, 88 y.o., female Today's Date: 12/02/2022   PCP: Sherry Mealing, MD REFERRING PROVIDER: Deeann [name]   PT End of Session - 12/01/22 0957     Visit Number 82    Number of Visits 92    Date for PT Re-Evaluation 12/29/22    Progress Note Due on Visit 90    PT Start Time 1145    PT Stop Time 1220    PT Time Calculation (min) 35 min    Equipment Utilized During Treatment Gait belt    Behavior During Therapy WFL for tasks assessed/performed                   Past Medical History:  Diagnosis Date   Acute blood loss anemia    Arthritis    Cancer (HCC)    skin   Central cord syndrome at C6 level of cervical spinal cord (HCC) 11/29/2017   Hypertension    Protein-calorie malnutrition, severe (HCC) 01/24/2018   S/P insertion of IVC (inferior vena caval) filter 01/24/2018   Tetraparesis (HCC)    Past Surgical History:  Procedure Laterality Date   ANTERIOR CERVICAL DECOMP/DISCECTOMY FUSION N/A 11/29/2017   Procedure: Cervical five-six, six-seven Anterior Cervical Decompression Fusion;  Surgeon: Sherry Pierini, MD;  Location: [facility];  Service: Neurosurgery;  Laterality: N/A;  Cervical five-six, six-seven Anterior Cervical Decompression Fusion   CATARACT EXTRACTION     FEMUR IM NAIL Left 08/16/2021   Procedure: INTRAMEDULLARY (IM) NAIL FEMORAL;  Surgeon: Sherry Saint, MD;  Location: [facility] ORS;  Service: Orthopedics;  Laterality: Left;   IR IVC FILTER PLMT / S&I /IMG GUID/MOD SED  12/08/2017   Patient Active Problem List   Diagnosis Date Noted   Age-related osteoporosis without current pathological fracture 07/27/2022   Mild recurrent major depression (HCC) 07/27/2022   Hypertensive heart disease with other congestive heart failure (HCC) 02/14/2022   Chronic indwelling Foley catheter 02/14/2022   Closed hip fracture (HCC) 08/15/2021   DVT (deep venous  thrombosis) (HCC) 08/15/2021   Quadriplegia (HCC) 08/15/2021   Fall 08/15/2021   Constipation due to slow transit 08/31/2018   Trauma 06/05/2018   Neuropathic pain 06/05/2018   Neurogenic bowel 06/05/2018   Vaginal yeast infection 01/30/2018   Healthcare-associated pneumonia 01/25/2018   Chronic allergic rhinitis 01/24/2018   Depression with anxiety 01/24/2018   UTI due to Klebsiella species 01/24/2018   Protein-calorie malnutrition, severe (HCC) 01/24/2018   S/P insertion of IVC (inferior vena caval) filter 01/24/2018   Tetraparesis (HCC) 01/20/2018   Neurogenic bladder 01/20/2018   Reactive depression    Benign essential HTN    Acute postoperative anemia due to expected blood loss    Central cord syndrome at C6 level of cervical spinal cord (HCC) 11/29/2017   Allergy to alpha-gal 11/25/2016   SCC (squamous cell carcinoma) 04/08/2014    ONSET DATE: 08/15/2021 (fall with B hip fx); Initial injury was in 2019- quadraplegia due to central cord syndrome of C6 secondary to neck fracture.   REFERRING DIAG: Bilateral Hip fx; Left Open- s/p Left IM nail femoral on 08/16/2021; Right closed  THERAPY DIAG:  Muscle weakness (generalized)  Other lack of coordination  Unsteadiness on feet  Cervicalgia  Repeated falls  Other abnormalities of gait and mobility  Difficulty in walking, not elsewhere classified  Abnormality of gait and mobility  Central cord syndrome, sequela (HCC)  Closed fracture of both hips, initial encounter (  HCC)  Rationale for Evaluation and Treatment Rehabilitation  SUBJECTIVE:   SUBJECTIVE STATEMENT:  Patient reports that her black heel popped off her left brace and that her caregiver were able to superglue it back on without issues.       Pt accompanied by:  Paid caregiver-   PERTINENT HISTORY: Sherry [name] is an 87yoF who experienced a fall at home on 08/15/2021 with hip fracture, s/p Left hip ORIF. PMH: quadriplegia due to central cord syndrome  of C6 secondary to neck fracture, HTN, depression with anxiety, neurogenic bladder, bilateral DVT on Xarelto (s/p of IVC filter placement). Prior history of significant PT/OT with improvement of function. Pt has full use of shoulder/elbows, limited use of hands, adaptive self feeding sucessful.     PAIN:  None current but does endorse some left thigh pain intermittent  OBJECTIVE:    TODAY'S TREATMENT:  12/01/2022     Therapeutic exercises: (all performed from inside of // bars) Active Assisted Seated marches 1x15 each leg  Seated resisted abduction 1x15 each LE Seated resistive adduction with 3 sec hold x 15 Seated LAQ 2x10 each LE  Static standing x 3 trials with 2 person assist - trials ranged from 1 minute to23 minutes. Less difficulty achieving erect position without pain- but less overall endurance- gluteals and LE fatigued today.          PATIENT EDUCATION: Education details: cues for isolated joint exercises  HOME EXERCISE PROGRAM: No changes at this time  Access Code: [facility] URL: [email or web address] Date: 11/23/2021 Prepared by: Sherry [name] Exercises - Seated Gluteal Sets - 2 x daily - 7 x weekly - 2 sets - 10 reps - 5 hold - Seated Quad Set - 2 x daily - 7 x weekly - 2 sets - 10 reps - 5 hold - Seated Long Arc Quad - 2 x daily - 7 x weekly - 2 sets - 10 reps - 5 hold - Seated March - 2 x daily - 7 x weekly - 2 sets - 10 reps - 5 hold - Seated Hip Abduction - 2 x daily - 7 x weekly - 2 sets - 10 reps - 5 hold - Seated Shoulder Shrugs - 2 x daily - 7 x weekly - 2 sets - 10 reps - 5 hold - Wheelchair Pressure Relief - 2 x daily - 7 x weekly - 2 sets - 10 reps - 5 hold  GOALS: Goals reviewed with patient? Yes  SHORT TERM GOALS: Target date: 02/22/2022  Pt will be independent with initial UE strengthening HEP in order to improve strength and balance in order to decrease fall risk and improve function at home and work. Baseline: 10/19/2021- patient with no  formal UE HEP; 12/02/2021= Patient verbalized knowledge of HEP including use of theraband for UE strengthening.  Goal status: GOAL MET  LONG TERM GOALS: Target date: 12/29/2022  Pt will be independent with final for UE/LE HEP in order to improve strength and balance in order to decrease fall risk and improve function at home and work. Baseline: Patient is currently BLE NWB and unable to participate in HEP. Has order for UE strengthening. 01/11/2022- Patient now able to participate in LE strengthening although NWB still- good understanding for some basic exercises. Will keep goal active to incorporate progressive LE strengthening exercises. 04/07/2022= Patient still participating in progressive LE seated HEP and has no questions at this time.  Goal status: MET  2.  Pt will improve FOTO to target score of 35  to display perceived improvements in ability to complete ADL's.  Baseline: 10/19/2021= 12; 12/02/2021= 17; 01/11/2022=17; 04/07/2022= 17; 07/05/2022=17; 10/06/2022- outcome measure not appropriate as patient in non-ambulatory and not consistently standing. Goal status: Goal not appropriate  3.  Pt will increase strength of B UE  by at least 1/2 MMT grade in order to demonstrate improvement in strength and function  Baseline: patient has range of 2-/5 to 4/5 BUE Strength; 12/02/2021= 4/5 except with wrist ext. 01/11/2022= 4/5 B UE strength expept for wrist Goal status: Goal revised-no longer appropriate- working with OT on all UE strengthening.   4. Pt. Will increase strength of RLE by at least 1/2 MMT grade in order to demonstrate improvement in Standing/transfers.  Baseline: 2-/5 Right hip flex/knee ext/flex; 04/07/2022= 2- with right hip flex/knee ext (lacking 28 deg from zero) 4/8= Patient able to ext right knee lacking 18 deg from zero. 07/05/2022=Left knee approx 8 deg from zero and right knee = 23 deg from zero  Goal status: Progressing  5. Pt. Will demo ability to stand pivot transfer with max assist  for improved functional mobility and less dependent need on mechanical device.   Baseline: Dependent on hoyer lift for all transfers; 04/07/2022- Unable to test secondary to patient with recent UTI and right hand procedure - will attempt to reassess next visit. ; 04/12/22: Unable to successfully stand due to feet plates on loaned power w/c; 05/10/2022- Patient still having to use rental chair and has not received her original power w/c back to practice SPT. 07/04/2021= Patient just received new power w/c and attempted standing from 1st time today- Patient able to stand with max assist today from w/c and did not attempt to perform pivot transfer- difficulty with static standing and placing weight on right LE. 10/06/2022- Not assessed today- patient needs new AFO prior to attempting transfers  Goal status: ONGOING  6. Pt. Will demonstrate improved functional LE strength as seen by ability to stand > 2 min for improved transfer ability and pregait abilities.   Baseline: not assessed today due to recent dx: UTI; 04/07/2022- Unable to test secondary to patient with recent UTI and right hand procedure - will attempt to reassess next visit; 04/12/22: Unable to successfully stand due to feet plates on loaned power w/c 05/10/2022- Patient still having to use rental chair and has not received her original power w/c back to practice Standing. 07/05/2022- Patient able to stand with max assist at trunk today for 2 min 3 sec. Will keep goal active to ensure consistency. 10/06/2022= Unable to assess today- patient with some recent blood pressure issues and needs new AFO. 11/24/2022= Patient demonstrated multiple rounds of standing in // bars- holding up to 1:45 min today but has been able to exhibit 2 min in recent past- working on consistency.   Goal status: ONGOING  ASSESSMENT:  CLINICAL IMPRESSION: Patient was able to achieve more erect standing with less overall effort but then fatigued quickly. She is not sleeping overall and this  may be affecting her performance overall. She exhibited less overall standing endurance vs. Previous few visits. Pt will continue to benefit from skilled PT services to optimize independence and reduced caregiver support.    OBJECTIVE IMPAIRMENTS Abnormal gait, decreased activity tolerance, decreased balance, decreased coordination, decreased endurance, decreased mobility, difficulty walking, decreased ROM, decreased strength, hypomobility, impaired flexibility, impaired UE functional use, postural dysfunction, and pain.   ACTIVITY LIMITATIONS carrying, lifting, bending, sitting, standing, squatting, sleeping, stairs, transfers, bed mobility, continence, bathing, toileting, dressing,  self feeding, reach over head, hygiene/grooming, and caring for others  PARTICIPATION LIMITATIONS: meal prep, cleaning, laundry, medication management, personal finances, interpersonal relationship, driving, shopping, community activity, and yard work  PERSONAL FACTORS Age, Time since onset of injury/illness/exacerbation, and 1-2 comorbidities: HTN, cervical Sx  are also affecting patient's functional outcome.   REHAB POTENTIAL: Good  CLINICAL DECISION MAKING: Evolving/moderate complexity  EVALUATION COMPLEXITY: Moderate  PLAN: PT FREQUENCY: 1-2x/week  PT DURATION: 12 weeks  PLANNED INTERVENTIONS: Therapeutic exercises, Therapeutic activity, Neuromuscular re-education, Balance training, Gait training, Patient/Family education, Self Care, Joint mobilization, DME instructions, Dry Needling, Electrical stimulation, Wheelchair mobility training, Spinal mobilization, Cryotherapy, Moist heat, and Manual therapy  PLAN FOR NEXT SESSION: strength, core training, Sit to stand and static standing; progress to SPT as able.    9:58 AM, 12/02/22 Louis Meckel, PT Physical Therapist -  [facility]  Outpatient Physical Therapy- Main Campus [phone]

## 2022-12-06 ENCOUNTER — Ambulatory Visit: Payer: Medicare HMO | Attending: Internal Medicine | Admitting: Occupational Therapy

## 2022-12-06 ENCOUNTER — Ambulatory Visit: Payer: Medicare HMO

## 2022-12-06 ENCOUNTER — Non-Acute Institutional Stay (SKILLED_NURSING_FACILITY): Payer: Medicare HMO | Admitting: Adult Health

## 2022-12-06 ENCOUNTER — Encounter: Payer: Self-pay | Admitting: Adult Health

## 2022-12-06 DIAGNOSIS — R269 Unspecified abnormalities of gait and mobility: Secondary | ICD-10-CM | POA: Diagnosis present

## 2022-12-06 DIAGNOSIS — R2689 Other abnormalities of gait and mobility: Secondary | ICD-10-CM | POA: Insufficient documentation

## 2022-12-06 DIAGNOSIS — R278 Other lack of coordination: Secondary | ICD-10-CM | POA: Diagnosis present

## 2022-12-06 DIAGNOSIS — R296 Repeated falls: Secondary | ICD-10-CM

## 2022-12-06 DIAGNOSIS — M6281 Muscle weakness (generalized): Secondary | ICD-10-CM | POA: Diagnosis present

## 2022-12-06 DIAGNOSIS — R21 Rash and other nonspecific skin eruption: Secondary | ICD-10-CM | POA: Diagnosis not present

## 2022-12-06 DIAGNOSIS — R262 Difficulty in walking, not elsewhere classified: Secondary | ICD-10-CM

## 2022-12-06 DIAGNOSIS — S72002A Fracture of unspecified part of neck of left femur, initial encounter for closed fracture: Secondary | ICD-10-CM | POA: Insufficient documentation

## 2022-12-06 DIAGNOSIS — S72001A Fracture of unspecified part of neck of right femur, initial encounter for closed fracture: Secondary | ICD-10-CM

## 2022-12-06 DIAGNOSIS — R2681 Unsteadiness on feet: Secondary | ICD-10-CM

## 2022-12-06 DIAGNOSIS — T50Z95A Adverse effect of other vaccines and biological substances, initial encounter: Secondary | ICD-10-CM

## 2022-12-06 DIAGNOSIS — S14129S Central cord syndrome at unspecified level of cervical spinal cord, sequela: Secondary | ICD-10-CM | POA: Insufficient documentation

## 2022-12-06 NOTE — Progress Notes (Unsigned)
Location:  Other Nursing Home Room Number: 219A Place of Service:  SNF (31) Provider:  Kenard Gower, DNP, FNP-BC  Patient Care Team: Earnestine Mealing, MD as PCP - General (Family Medicine)  Extended Emergency Contact Information Primary Emergency Contact: [name],[name] Mobile Phone: [phone] Relation: Daughter Secondary Emergency Contact: [name] Address: [address]          [place], [place] [address] [place] of [place] Home Phone: [phone] Mobile Phone: [phone] Relation: Spouse  Code Status:  ***  Goals of care: Advanced Directive information    11/17/2022    1:26 PM  Advanced Directives  Does Patient Have a Medical Advance Directive? Yes  Type of Estate agent of [place];Out of facility DNR (pink MOST or yellow form);Living will  Does patient want to make changes to medical advance directive? No - Patient declined  Copy of Healthcare Power of Attorney in Chart? Yes - validated most recent copy scanned in chart (See row information)     Chief Complaint  Patient presents with   Acute Visit    Vaccine reaction     HPI:  Pt is a 88 y.o. female seen today for medical management of chronic diseases.  ***   Past Medical History:  Diagnosis Date   Acute blood loss anemia    Arthritis    Cancer (HCC)    skin   Central cord syndrome at C6 level of cervical spinal cord (HCC) 11/29/2017   Hypertension    Protein-calorie malnutrition, severe (HCC) 01/24/2018   S/P insertion of IVC (inferior vena caval) filter 01/24/2018   Tetraparesis (HCC)    Past Surgical History:  Procedure Laterality Date   ANTERIOR CERVICAL DECOMP/DISCECTOMY FUSION N/A 11/29/2017   Procedure: Cervical five-six, six-seven Anterior Cervical Decompression Fusion;  Surgeon: Jadene Pierini, MD;  Location: [facility] OR;  Service: Neurosurgery;  Laterality: N/A;  Cervical five-six, six-seven Anterior Cervical Decompression Fusion   CATARACT  EXTRACTION     FEMUR IM NAIL Left 08/16/2021   Procedure: INTRAMEDULLARY (IM) NAIL FEMORAL;  Surgeon: Deeann Saint, MD;  Location: [facility] ORS;  Service: Orthopedics;  Laterality: Left;   IR IVC FILTER PLMT / S&I /IMG GUID/MOD SED  12/08/2017    Allergies  Allergen Reactions   Other Swelling   Lisinopril-Hydrochlorothiazide Swelling    Swelling of the tongue   Beef-Derived Products Swelling    Facial and lip swelling (due to tick bite)   Dairy Aid [Tilactase] Swelling   Lisinopril-Hydrochlorothiazide Swelling    Tongue swelling   Pork-Derived Products Swelling    Outpatient Encounter Medications as of 12/06/2022  Medication Sig   acetaminophen (TYLENOL) 500 MG tablet Take 500 mg by mouth every 12 (twelve) hours as needed for mild pain.   acetaminophen (TYLENOL) 650 MG CR tablet Take 650 mg by mouth every 8 (eight) hours as needed for pain.   acetic acid 0.25 % irrigation Use 30ml via irrigation twice daily to keep foley Cath patent.   alendronate (FOSAMAX) 70 MG tablet Take 70 mg by mouth every Saturday.   calcium carbonate (CALCIUM 600) 600 MG TABS tablet Take 600 mg by mouth at bedtime.   Capsaicin 0.1 % CREA Apply 1 application  topically every evening. (Apply to hands)   cetirizine (ZYRTEC) 5 MG tablet Take 5 mg by mouth 2 (two) times daily.   cholecalciferol (VITAMIN D3) 25 MCG (1000 UNIT) tablet Take 1,000 Units by mouth daily.   DULoxetine (CYMBALTA) 60 MG capsule Take 60 mg by mouth daily.  ferrous sulfate 325 (65 FE) MG tablet Take 325 mg by mouth daily with breakfast.   fluocinonide (LIDEX) 0.05 % external solution Apply 1 Application topically every 12 (twelve) hours as needed.   gabapentin (NEURONTIN) 100 MG capsule Take 1 capsule (100 mg total) by mouth daily.   hydroxypropyl methylcellulose / hypromellose (ISOPTO TEARS / GONIOVISC) 2.5 % ophthalmic solution Place 1 drop into both eyes every hour as needed for dry eyes.   Infant Care Products [facility]) OINT Apply to  buttocks/gluteal folds topically every shift.   ketoconazole (NIZORAL) 2 % shampoo Apply 1 Application topically 2 (two) times a week.   lactulose, encephalopathy, (CHRONULAC) 10 GM/15ML SOLN Take 30 mLs by mouth daily as needed (mild constipation).   magnesium citrate solution Take 60-120 mLs by mouth daily as needed (constipation).   methylcellulose oral powder Take 1 packet by mouth at bedtime.   mometasone (ELOCON) 0.1 % lotion Apply 1 Application topically every Saturday at 6 PM. (Apply to the ear canal for eczema)   Multiple Vitamin (MULTIVITAMIN WITH MINERALS) TABS tablet Take 1 tablet by mouth daily.   olopatadine (PATADAY) 0.1 % ophthalmic solution Place 1 drop into both eyes 2 (two) times daily.   polyethylene glycol (MIRALAX / GLYCOLAX) 17 g packet Take 17 g by mouth 2 (two) times daily.   rivaroxaban (XARELTO) 10 MG TABS tablet Take 10 mg by mouth daily.   saccharomyces boulardii (FLORASTOR) 250 MG capsule Take 250 mg by mouth daily.   Saline Spray 0.2 % SOLN Place 2 sprays into both nostrils as needed (nasal congestion).   simethicone (MYLICON) 80 MG chewable tablet Chew 80 mg by mouth every 6 (six) hours as needed for flatulence.   sodium phosphate (FLEET) 7-19 GM/118ML ENEM Place 1 enema rectally every other day as needed for severe constipation.   spironolactone (ALDACTONE) 25 MG tablet Take 0.5 tablets (12.5 mg total) by mouth daily.   tiZANidine (ZANAFLEX) 2 MG tablet Take 1 tablet (2 mg total) by mouth 2 (two) times daily.   triamcinolone cream (KENALOG) 0.1 % Apply to thigh and lower legs topically every 8 hours as needed. Apply to back of neck topically as needed.   Zinc Oxide (TRIPLE PASTE) 12.8 % ointment Apply 1 Application topically. Every shift.   No facility-administered encounter medications on file as of 12/06/2022.    Review of Systems  Constitutional:  Negative for appetite change, chills, fatigue and fever.  HENT:  Negative for congestion, hearing loss,  rhinorrhea and sore throat.   Eyes: Negative.   Respiratory:  Negative for cough, shortness of breath and wheezing.   Cardiovascular:  Negative for chest pain, palpitations and leg swelling.  Gastrointestinal:  Negative for abdominal pain, constipation, diarrhea, nausea and vomiting.  Genitourinary:  Negative for dysuria.  Musculoskeletal:  Negative for arthralgias, back pain and myalgias.  Skin:  Negative for color change, rash and wound.  Neurological:  Negative for dizziness, weakness and headaches.  Psychiatric/Behavioral:  Negative for behavioral problems. The patient is not nervous/anxious.    ***    Immunization History  Administered Date(s) Administered   Influenza, High Dose Seasonal PF 11/17/2021   Influenza-Unspecified 11/14/2018, 11/20/2019, 11/17/2020, 11/17/2021   Moderna Covid-19 Fall Seasonal Vaccine 66yrs & older 05/11/2022   Moderna Covid-19 Vaccine Bivalent Booster 27yrs & up 06/30/2021, 12/11/2021, 10/29/2022   Moderna Sars-Covid-2 Vaccination 02/09/2019, 03/09/2019, 12/14/2019, 06/20/2020, 10/24/2020   PPD Test 05/16/2019   Tdap 11/29/2017   Zoster Recombinant(Shingrix) 09/02/2022   Pertinent  Health Maintenance Due  Topic Date Due   INFLUENZA VACCINE  09/02/2022   DEXA SCAN  Discontinued      08/19/2021   10:00 AM 08/19/2021    8:54 PM 08/20/2021    8:20 AM 03/29/2022   10:11 AM 08/31/2022    2:42 PM  Fall Risk  Falls in the past year?    0 0  Was there an injury with Fall?    0   Fall Risk Category Calculator    0   (RETIRED) Patient Fall Risk Level High fall risk High fall risk High fall risk    Patient at Risk for Falls Due to    No Fall Risks      Vitals:   12/06/22 1709  BP: 126/70  Pulse: 72  Resp: 18  Temp: (!) 97.5 F (36.4 C)  SpO2: 96%  Weight: 182 lb 6.4 oz (82.7 kg)  Height: 5' 8.5" (1.74 m)   Body mass index is 27.33 kg/m.  Physical Exam Constitutional:      Appearance: Normal appearance.  HENT:     Head: Normocephalic and  atraumatic.     Nose: Nose normal.     Mouth/Throat:     Mouth: Mucous membranes are moist.  Eyes:     Conjunctiva/sclera: Conjunctivae normal.  Cardiovascular:     Rate and Rhythm: Normal rate and regular rhythm.  Pulmonary:     Effort: Pulmonary effort is normal.     Breath sounds: Normal breath sounds.  Abdominal:     General: Bowel sounds are normal.     Palpations: Abdomen is soft.  Musculoskeletal:     Cervical back: Normal range of motion.  Skin:    General: Skin is warm and dry.     Comments: Right deltoid erythema (7 X 6 cm), warm to touch  Neurological:     General: No focal deficit present.     Mental Status: She is alert and oriented to person, place, and time.  Psychiatric:        Mood and Affect: Mood normal.        Behavior: Behavior normal.        Thought Content: Thought content normal.        Judgment: Judgment normal.        Labs reviewed: Recent Labs    04/21/22 1200 06/02/22 0000 09/29/22 0000 11/04/22 0000  NA 133* 140 139 139  K 3.8 4.6 3.8 3.8  CL 100 102 100 100  CO2 30 31* 29* 32*  GLUCOSE 108*  --   --   --   BUN 8 13 10 9   CREATININE 0.68 0.5 0.5 0.5  CALCIUM 8.0* 9.0 8.6* 8.8   Recent Labs    02/08/22 0000 09/29/22 0000  AST 18 20  ALT 14 14  ALKPHOS 65 58  ALBUMIN 3.7 3.6   Recent Labs    03/08/22 0000 04/21/22 1200 04/21/22 1731 04/26/22 0000 05/03/22 0000 05/10/22 0000 05/17/22 0000 09/30/22 0000  WBC 6.2 7.1  --  5.7  --   --   --  4.7  NEUTROABS 3,683.00  --   --  3,283.00  --   --   --  2,627.00  HGB 11.9* 8.5*   < > 8.3*   < > 10.0* 10.7* 13.2  HCT 35* 26.6*   < > 26*   < > 31* 33* 41  MCV  --  99.3  --   --   --   --   --   --  PLT 360 348  --  444*  --   --   --  280   < > = values in this interval not displayed.   Lab Results  Component Value Date   TSH 1.31 09/29/2022   No results found for: "HGBA1C" No results found for: "CHOL", "HDL", "LDLCALC", "LDLDIRECT", "TRIG", "CHOLHDL"  Significant  Diagnostic Results in last 30 days:  No results found.  Assessment/Plan ***   Family/ staff Communication: Discussed plan of care with resident and charge nurse  Labs/tests ordered:     Kenard Gower, DNP, MSN, FNP-BC [facility] [phone] (Monday-Friday 8:00 a.m. - 5:00 p.m.) [phone] (after hours)

## 2022-12-06 NOTE — Therapy (Signed)
Occupational Therapy Progress Note  Dates of reporting period  10/27/2022   to   12/06/2022   Patient Name: Sherry [name] MRN: [id number] DOB:Sep 18, 1934, 88 y.o., female Today's Date: 11/29/2022  PCP:  Cindi Carbon PROVIDER: Earnestine Mealing, MD  END OF SESSION:   OT End of Session - 12/06/22 1115     Visit Number 90    Number of Visits 120    Date for OT Re-Evaluation 01/19/23    Authorization Time Period Progress report period starting 0812/2024    OT Start Time 1100    OT Stop Time 1145    OT Time Calculation (min) 45 min    Activity Tolerance Patient tolerated treatment well    Behavior During Therapy [facility] for tasks assessed/performed                 Past Medical History:  Diagnosis Date   Acute blood loss anemia    Arthritis    Cancer (HCC)    skin   Central cord syndrome at C6 level of cervical spinal cord (HCC) 11/29/2017   Hypertension    Protein-calorie malnutrition, severe (HCC) 01/24/2018   S/P insertion of IVC (inferior vena caval) filter 01/24/2018   Tetraparesis (HCC)    Past Surgical History:  Procedure Laterality Date   ANTERIOR CERVICAL DECOMP/DISCECTOMY FUSION N/A 11/29/2017   Procedure: Cervical five-six, six-seven Anterior Cervical Decompression Fusion;  Surgeon: Jadene Pierini, MD;  Location: [facility] OR;  Service: Neurosurgery;  Laterality: N/A;  Cervical five-six, six-seven Anterior Cervical Decompression Fusion   CATARACT EXTRACTION     FEMUR IM NAIL Left 08/16/2021   Procedure: INTRAMEDULLARY (IM) NAIL FEMORAL;  Surgeon: Deeann Saint, MD;  Location: [facility] ORS;  Service: Orthopedics;  Laterality: Left;   IR IVC FILTER PLMT / S&I /IMG GUID/MOD SED  12/08/2017   Patient Active Problem List   Diagnosis Date Noted   Age-related osteoporosis without current pathological fracture 07/27/2022   Mild recurrent major depression (HCC) 07/27/2022   Hypertensive heart disease with other congestive heart failure (HCC) 02/14/2022   Chronic indwelling  Foley catheter 02/14/2022   Closed hip fracture (HCC) 08/15/2021   DVT (deep venous thrombosis) (HCC) 08/15/2021   Quadriplegia (HCC) 08/15/2021   Fall 08/15/2021   Constipation due to slow transit 08/31/2018   Trauma 06/05/2018   Neuropathic pain 06/05/2018   Neurogenic bowel 06/05/2018   Vaginal yeast infection 01/30/2018   Healthcare-associated pneumonia 01/25/2018   Chronic allergic rhinitis 01/24/2018   Depression with anxiety 01/24/2018   UTI due to Klebsiella species 01/24/2018   Protein-calorie malnutrition, severe (HCC) 01/24/2018   S/P insertion of IVC (inferior vena caval) filter 01/24/2018   Tetraparesis (HCC) 01/20/2018   Neurogenic bladder 01/20/2018   Reactive depression    Benign essential HTN    Acute postoperative anemia due to expected blood loss    Central cord syndrome at C6 level of cervical spinal cord (HCC) 11/29/2017   Allergy to alpha-gal 11/25/2016   SCC (squamous cell carcinoma) 04/08/2014    ONSET DATE: 11/29/2017  REFERRING DIAG: Central Cord Syndrome at C6 of the Cervical Spinal Cord, Fall with Bilateral Closed Hip Fractures, with ORIF repair with Intramedullary Nailing of the Left Hip Fracture.   THERAPY DIAG:  Muscle weakness (generalized)  Other lack of coordination  Rationale for Evaluation and Treatment: Rehabilitation  SUBJECTIVE:     SUBJECTIVE STATEMENT:  Pt. Reports having had a reaction in her right shoulder to the shingles vaccine    Pt accompanied by: self, Personal Care  aide  PERTINENT HISTORY: Central Cord Syndrome at C6 of the Cervical Spinal Cord, Fall with Bilateral Closed Hip Fractures, with ORIF repair with Intramedullary Nailing of the Left Hip Fracture.   PRECAUTIONS: None  WEIGHT BEARING RESTRICTIONS: No  PAIN:  Are you having pain? Persistent Headache pain: NR   LIVING ENVIRONMENT: Lives with: [facility]  Stairs: No Has following equipment at home: Wheelchair (power)  PLOF:  Independent   PATIENT GOALS:  See below for established goals  OBJECTIVE:  Note: Objective measures were completed at Evaluation unless otherwise noted.  HAND DOMINANCE: Right  ADLs: Caregiver assist with ADLs, and [facility]  MOBILITY STATUS: Uses a power w/c  ACTIVITY TOLERANCE: Activity tolerance: Fair  FUNCTIONAL OUTCOME MEASURES:  Measurements:   12/09/2021:   Shoulder flexion: R: 120(136), L: 138(150) Shoulder abduction: R: 108(120), L: 110(120) Elbow: R: 0-148, L: 0-146 Wrist flexion: R: 55(62), L: 78 Writ extension: R: 34(55), L: 3(4) RD: R: 12(20), L: 16(26) UD: R: 8(24), L: 6(18) Thumb radial abduction: R: 11(20), L: 8(20) Digit flexion to the [facility]: R:  2nd: 5cm(3.5cm), 3rd: 7.5 cm(5cm), 4th: 7cm(3cm), 5th: 6cm(5.5cm) L: 2nd: 8cm(8cm), 3rd: 8cm(8cm), 4th: 7.5cm(7cm), 5th: 7cm(7cm)   02/03/2022:   Shoulder flexion: R: 122(138), L: 148(154) Shoulder abduction: R: 109(125), L: 125(125 Elbow: R: 0-148, L: 0-146 Wrist flexion: R: 60(68), L: 79 Writ extension: R: 40(55), L: 3(10) RD: R: 14(24), L: 20(28) UD: R: 8(24), L: 6(18) Thumb radial abduction: R: 20(25), L: 8(20) Digit flexion to the [place] [facility]: R:  2nd: 4.5cm(3cm), 3rd: 6.5cm(4.5cm), 4th: 6.5 cm(3cm), 5th: 4.5cm(5cm) L: 2nd: 8cm(8cm), 3rd: 8cm(7cm), 4th: 7.5cm(7cm), 5th: 6.5cm(6.5cm)   04/07/2022:   Shoulder flexion: R: 125(150), L: 160(160) Shoulder abduction: R: 109(125), L: 125(125 Elbow: R: 0-148, L: 0-146 Wrist flexion: R: 60(68), L: 79 Writ extension: R: 40(55), L: 5(18) RD: R: 14(24), L: 20(28) UD: R: 18(24), L: 8(18) Thumb radial abduction: R: 20(25), L: 8(20) Digit flexion to the [facility]: R:  2nd: 4.5 cm(4 cm), 3rd: 6.5cm(3cm), 4th: 7 cm (5cm), 5th: 5cm(5cm) L: 2nd: 8cm(8cm), 3rd: 8cm(7cm), 4th: 7.5cm(7cm), 5th: 7cm(7cm)   07/05/2022:   Shoulder flexion: R: 125(154), L: 160(160) Shoulder abduction: R: 95(130), L: 143(143) Elbow: R: 0-148, L: 0-146 Wrist flexion: R: 60(68), L: 79 Writ  extension: R: 40(60), L: 0(12) RD: R: 10(24), L: 12(20) UD: R: 16(24), L: 8(16) Thumb radial abduction: R: 20(25), L: 8(20) Digit flexion to the [facility]: R:  2nd: 4 cm(4cm), 3rd: 6.5cm( 3.5cm), 4th: 7 cm (5cm), 5th: 6cm(5cm) L: 2nd: 8cm(8cm), 3rd: 8cm(7cm), 4th: 8cm(7cm), 5th: 7cm(7cm)   09/13/2022:   Shoulder flexion: R: 125(154), L: 160(160) Shoulder abduction: R: 100(130), L: 143(143) Elbow: R: 0-150, L: 0-150 Wrist flexion: R: 55(68), L: 79 Writ extension: R: 45(60), L: 0(12) RD: R: 10(24), L: 12(20) UD: R: 16(24), L: 8(16) Thumb radial abduction: R: 20(25), L: 8(20) Digit flexion to the [facility]: R:  2nd: 4 cm(4cm), 3rd: 6cm( 5cm), 4th: 6 cm (4cm), 5th: 5cm(5cm) L: 2nd: 7cm(cm), 3rd: 7cm(6cm), 4th: 7cm(6cm), 5th: 6cm(6cm)   10/27/2022   Shoulder flexion: R: 128(154), L: 160(160) Shoulder abduction: R: 105(130), L: 143(143) Elbow: R: 0-150, L: 0-150 Wrist flexion: R: 55(68), L: 79 Writ extension: R: 46(64), L: 0(20) RD: R: 10(20), L: 18(24) UD: R: 118(28), L: 8(20) Thumb radial abduction: R: 30(42), L:20(24) Digit flexion to the [facility]: R:  2nd: 4 cm(3cm), 3rd: 6cm( 6cm), 4th: 6 cm (5cm), 5th: 5cm(5cm) L: 2nd: 8cm(8cm), 3rd: 8cm(6cm), 4th: 8cm(7cm), 5th: 6cm(6cm)  COORDINATION:  Impaired 2/2 bilateral hand digit MP, PIP, and DIP extension tightness  SENSATION: Intact  EDEMA: N/A   COGNITION: Overall cognitive status: WNL   VISION: Subjective report: Wears glasses, No changes in vision.   PERCEPTION: Intact  TODAY'S TREATMENT:                                                                                                                              DATE: 12/06/2022   Manual Therapy:   Pt. tolerated soft tissue massage to the volar, and dorsal surface of each digit on the bilateral hands, and thenar eminence secondary to stiffness following moist heat modality. Soft tissue mobilization was performed for carpal, and metacarpal spread stretches. Manual therapy was  performed independent of, and in preparation for therapeutic Ex.   Therapeutic Ex.    Pt. tolerated PROM followed by AROM bilateral wrist extension, PROM for bilateral digit MP, PIP, and DIP flexion, and extension, thumb radial, and palmar abduction.   PATIENT EDUCATION: Education details: Bilateral hand function, ROM, and strengthening Person educated: Patient Education method: Explanation, Demonstration, and Verbal cues Education comprehension: verbalized understanding and returned demonstration  HOME EXERCISE PROGRAM: Continue to assess HEP needs, and provide as needed.    GOALS: Goals reviewed with patient? Yes    LONG TERM GOALS: Target date: 01/19/2023  3.  Patient will demonstrate improved composite finger flexion to be able to firmly hold and use adaptive devices during ADLs, and IADLs.  Baseline: 12/06/2022: Pt. Continues to present with increased MP, PIP, and DIP digit tightness/stiffness limiting the formulation of bilateral composites fists. 10/27/2022: Pt. presents with increased MP, PIP, and DIP digit tightness/stiffness limiting the formulation of bilateral composites fists during this progress reporting period limiting. Digit flexion to the [facility]: R:  2nd: 4 cm(3cm), 3rd: 6cm( 6cm), 4th: 6 cm (5cm), 5th: 5cm(5cm), L: 2nd: 8cm(8cm), 3rd: 8cm(6cm), 4th: 8cm(7cm), 5th: 6cm(6cm) 09/20/2022: Improving digit flexion to the [facility]. 09/13/22: improved digit flexion to the [facility]. 07/05/2022: Pt. presents with digit MP, PIP, and DIP extensor tightness limiting her ability to securely grip objects in her bilateral hands.  04/07/2022: Pt. Has improved with right 2nd, and 3rd digit flexion towards the [facility]. Pt. Continues to have difficulty securely holding and applying deodorant.02/03/2022: Pt. Has improved with digit flexion, however, continues to have difficulty securely holding and using deodorant. 12/09/2021: Bilateral hand/digit MP, PIP, and DIP extension tightness limits her ability achieve digit  flexion to hold, and apply deodorant. 10/28/2021:  Pt. continues to have difficulty holding the deodorant. 01/12/2021: Pt. Presents with limited digit extension. Pt. Is able to initiate holding deodorant, however is unable to hold it while using it  Goal status: Ongoing   4.  Pt. Will improve bilateral wrist extension in preparation for anticipating, and initiating reaching for objects at the table.  Baseline: 12/06/2022: Pt continues to to progress with bilateral wrist extension in preparation fro functional reaching. 10/27/2022: Pt. Is improving with bilateral wrist  extension. R: 46(64) L: 0(20) 09/20/2022: limited PROM in the Left wrist extension 2/2 tightness/stiffness. 09/13/22: R: 55(68), L: 0(12) 07/19/2022: Bilateral wrist extension in limited. 07/05/2022: Right: 40(60) Left: 0(12) 04/07/2022: Right: 40(55) Left: 5(18) 02/03/2022: Right: 34(55) Left: 3(4) 12/09/2021: Right  34(55)  Left  3(4) 10/28/2021:  Right 22(38), Left 0(15) 09/14/2021:  Right: 17(35), left 2(15)  Goal status: Ongoing  7.  Pt. Will increase bilateral lateral pinch strength by 2 lbs to be able to securely grasp items during ADLs, and IADL tasks.  Baseline: 12/06/2022: Pt. Is able to more securely hold objects during ADLs/IADLs 10/27/2022: Pt. Is improving with holding items with her bilateral thumbs. (Pinch meter out for calibration) 09/20/2022: lateral pinch continues to be limited 09/13/22: R 6.5# L 4# 07/19/2022: TBD 07/05/2022: TBD 07/2022: NT-Pinch meter out for calibration. 02/03/2022: Right: 6#, Left: 4# 12/09/2021: Right: 6#, Left: 4#  Goal status: Ongoing   9.  Pt. will complete plant care with modified independence.  Baseline:12/06/2022: Pt. Is independent with plant care in her husband's room, however is not able to access her plants due to furniture blocking access to them. 10/27/2022: Pt. Is able to water plants that are closer, and within reach. Pt. continues to have difficulty reaching for thorough plant care. 09/20/2022: Pt.  Continues to have difficulty reaching for thorough plant care. 09/13/22: continues to report intermittent difficulty 07/19/2022: Pt. Continues to be able to water, and care for some of her plants. Pt. Has more difficulty with plants that are harder to reach. 07/05/2022: pt. Is able to water, and care for some of her plants. Pt. Has more difficulty with plants that are harder to reach. 04/07/2022: Pt. is now able to hold a cup and water her plants.02/03/2022: Pt. has difficulty caring for her plants.  Goal status:Achieved   10.  Pt. will demonstrate adaptive techniques to assist with the efficiency of self-dressing, or morning care tasks.  Baseline: 12/06/2022: Pt. requires assist from staff, as Pt. Reports staff have decreased time. 10/27/2022: Pt. is able to assist with initiating UE dressing. Pt. requires assist from personal, and staff care aides. 09/20/2022: Pt. continues to require assist form personal, and staff care aides. 09/13/22: MODA dressing, reports she is often rushed 07/19/2022: Pt. continues to require assist with self-dressing/morning care tasks. 07/05/2022: Continue with goal. 04/07/2022: Pt. Continues to require assist with the efficiency of self-dressing, and morning care tasks.02/03/2022: Pt. requires assist from caregivers 2/2 time limitations during morning care.  Goal status: Ongoing  11.  Pt. will improve BUE strength to improve ADL, and IADL functioning.  Baseline: 12/06/2022: Continue 10/27/2022: shoulder flexion L 4/5, R 3+/5; shoulder abduction L 4+/5, R 3+/5. elbow flexion B 5/5, elbow extension 4/5 09/20/2022: Pt. Presents with limited BUE strength 09/13/22: shoulder flexion L 4/5, R 3/5; elbow flexion B 5/5, elbow extension 4-/5; shoulder abduction L 4+/5, R 3+/5. 07/19/2022: BUE strength continues to be limited. 07/05/2022: shoulder flexion: right 4-/5, abduction: 3+/5, elbow flexion: right: 5/5, left 5/5, extension: right: 4-/5, left 4-/5, wrist extension: right: 3-/5, left: 2-/5  Goal  status: Ongoing  12.  Pt. will improve bilateral thumb radial abduction in order to be able to hold the grab bars while standing with PT  Baseline: 12/06/2022: Pt. Continues to present  with limited thumb abduction, however is improving holding onto the parallel bars.10/27/2022: thumb radial abduction: R: 30(42), L: 20(24  Goal status: Ongoing  13.  Pt. Will be able to securely hold, and stabilize medication bottles at a tabletop surface  when opening, and closing them.  Baseline: 12/06/2022: Pt is improving with securely stabilizing medication bottles. 10/27/2022: Pt. Stabilizes bottles against her torso when attempting to open, and close them   Goal status:Ongoing    ASSESSMENT:  CLINICAL IMPRESSION:  Pt. reports having a headache from not sleeping well last night. Pt. reports having had a reaction to the shingles vaccine  in her right shoulder. Pt. Is making progress with engaging, and using her hands more during ADL, and IADL tasks. Pt. Is now able to securely hold, and use the salt, and pepper shakers to season the food on her plate. Pt. Is able to consistently hold and open medication bottles. Pt. continues to present with tightness at the bilateral thumb webspace, and bilateral wrist flexor tightness, as well as digit MP, PIP, and DIP extensor tightness which continues to limit her ability to complete ADL tasks efficiently. Pt. continues to benefit from working on impoving ROM, and UE strength in order to work towards increasing bilateral hand grasp on objects, and increasing engagement of bilateral hands during ADLs, and IADL tasks.    PERFORMANCE DEFICITS: in functional skills including ADLs, IADLs, coordination, dexterity, ROM, strength, and UE functional use, cognitive skills including , and psychosocial skills including coping strategies and environmental adaptation.   IMPAIRMENTS: are limiting patient from ADLs, IADLs, and leisure.   CO-MORBIDITIES: may have co-morbidities  that  affects occupational performance. Patient will benefit from skilled OT to address above impairments and improve overall function.  MODIFICATION OR ASSISTANCE TO COMPLETE EVALUATION: Min-Moderate modification of tasks or assist with assess necessary to complete an evaluation.  OT OCCUPATIONAL PROFILE AND HISTORY: Detailed assessment: Review of records and additional review of physical, cognitive, psychosocial history related to current functional performance.  CLINICAL DECISION MAKING: Moderate - several treatment options, min-mod task modification necessary  REHAB POTENTIAL: Good for stated goals    PLAN:  OT FREQUENCY 2x's a week  OT DURATION: 12 weeks  PLANNED INTERVENTIONS ADL training, A/E training, UE ther. Ex, Manual therapy, neuromuscular re-education, moist heat modality, Paraffin Bath, Splinting, and  Pt./caregiver education   RECOMMENDED OTHER SERVICES: PT  CONSULTED AND AGREED WITH PLAN OF CARE: Patient  PLAN FOR NEXT SESSION: Continue Treatment as per established POC   Olegario Messier, MS, OTR/L 12/06/2022, 2:16 PM

## 2022-12-06 NOTE — Therapy (Signed)
OUTPATIENT PHYSICAL THERAPY NEURO TREATMENT    Patient Name: Sherry [name] MRN: [id number] DOB:[birth year], 88 y.o., female Today's Date: 12/06/2022   PCP: Earnestine Mealing, MD REFERRING PROVIDER: Deeann Saint   PT End of Session - 12/06/22 1149     Visit Number 83    Number of Visits 92    Date for PT Re-Evaluation 12/29/22    Authorization Type [employer] FOTO performed by PT on eval (7/20), score 12, Progress note on 03/10/2020; PN on 08/20/2020    Authorization Time Period Initial cert 07/31/5282-13/24/4010; Recert 01/11/2022-04/05/2022; Recert 04/07/2022-06/30/2022    Progress Note Due on Visit 90    PT Start Time 1149    PT Stop Time 1228    PT Time Calculation (min) 39 min    Equipment Utilized During Treatment Gait belt    Activity Tolerance Patient tolerated treatment well;No increased pain;Patient limited by fatigue    Behavior During Therapy [facility] for tasks assessed/performed                   Past Medical History:  Diagnosis Date   Acute blood loss anemia    Arthritis    Cancer (HCC)    skin   Central cord syndrome at C6 level of cervical spinal cord (HCC) 11/29/2017   Hypertension    Protein-calorie malnutrition, severe (HCC) 01/24/2018   S/P insertion of IVC (inferior vena caval) filter 01/24/2018   Tetraparesis (HCC)    Past Surgical History:  Procedure Laterality Date   ANTERIOR CERVICAL DECOMP/DISCECTOMY FUSION N/A 11/29/2017   Procedure: Cervical five-six, six-seven Anterior Cervical Decompression Fusion;  Surgeon: Jadene Pierini, MD;  Location: [facility] OR;  Service: Neurosurgery;  Laterality: N/A;  Cervical five-six, six-seven Anterior Cervical Decompression Fusion   CATARACT EXTRACTION     FEMUR IM NAIL Left 08/16/2021   Procedure: INTRAMEDULLARY (IM) NAIL FEMORAL;  Surgeon: Deeann Saint, MD;  Location: [facility] ORS;  Service: Orthopedics;  Laterality: Left;   IR IVC FILTER PLMT / S&I /IMG GUID/MOD SED  12/08/2017   Patient Active Problem List    Diagnosis Date Noted   Age-related osteoporosis without current pathological fracture 07/27/2022   Mild recurrent major depression (HCC) 07/27/2022   Hypertensive heart disease with other congestive heart failure (HCC) 02/14/2022   Chronic indwelling Foley catheter 02/14/2022   Closed hip fracture (HCC) 08/15/2021   DVT (deep venous thrombosis) (HCC) 08/15/2021   Quadriplegia (HCC) 08/15/2021   Fall 08/15/2021   Constipation due to slow transit 08/31/2018   Trauma 06/05/2018   Neuropathic pain 06/05/2018   Neurogenic bowel 06/05/2018   Vaginal yeast infection 01/30/2018   Healthcare-associated pneumonia 01/25/2018   Chronic allergic rhinitis 01/24/2018   Depression with anxiety 01/24/2018   UTI due to Klebsiella species 01/24/2018   Protein-calorie malnutrition, severe (HCC) 01/24/2018   S/P insertion of IVC (inferior vena caval) filter 01/24/2018   Tetraparesis (HCC) 01/20/2018   Neurogenic bladder 01/20/2018   Reactive depression    Benign essential HTN    Acute postoperative anemia due to expected blood loss    Central cord syndrome at C6 level of cervical spinal cord (HCC) 11/29/2017   Allergy to alpha-gal 11/25/2016   SCC (squamous cell carcinoma) 04/08/2014    ONSET DATE: 08/15/2021 (fall with B hip fx); Initial injury was in 2019- quadraplegia due to central cord syndrome of C6 secondary to neck fracture.   REFERRING DIAG: Bilateral Hip fx; Left Open- s/p Left IM nail femoral on 08/16/2021; Right closed  THERAPY DIAG:  Muscle  weakness (generalized)  Difficulty in walking, not elsewhere classified  Closed fracture of both hips, initial encounter (HCC)  Abnormality of gait and mobility  Repeated falls  Other lack of coordination  Unsteadiness on feet  Other abnormalities of gait and mobility  Central cord syndrome, sequela (HCC)  Rationale for Evaluation and Treatment Rehabilitation  SUBJECTIVE:   SUBJECTIVE STATEMENT:  Pt reported feeling a headache.  Pt received a shingles vaccination over the weekend and is experiencing slight pain in her R shoulder from vaccination site.     Pt accompanied by:  Paid caregiver-   PERTINENT HISTORY: Sherry [name] is an 87yoF who experienced a fall at home on 08/15/2021 with hip fracture, s/p Left hip ORIF. PMH: quadriplegia due to central cord syndrome of C6 secondary to neck fracture, HTN, depression with anxiety, neurogenic bladder, bilateral DVT on Xarelto (s/p of IVC filter placement). Prior history of significant PT/OT with improvement of function. Pt has full use of shoulder/elbows, limited use of hands, adaptive self feeding sucessful.     PAIN:  None current but does endorse some left thigh pain intermittent  OBJECTIVE:    TODAY'S TREATMENT:  12/06/2022     Therapeutic exercises: (all performed from inside of // bars) Active Assisted Seated marches 2x15 each leg  Seated hip abduction 2x10 3# AW LLE AAROM Seated hip abduction 1x10 RLE AAROM Seated LAQ 1x12 each LE AAROM Seated LAQ 1x10 2# AW LLE AROM, 1x8 2.5# AW LLE AROM, 1x6 3# AW AROM Seated LAQ 2x10 RLE (5 AROM, 15 AAROM) Seated LAQ 1x6 RLE AAROM with 3 second holds  Seated Hamstring curls RTB 2x15 each LE  Seated leg press 2x10 each LE            PATIENT EDUCATION: Education details: cues for isolated joint exercises  HOME EXERCISE PROGRAM: No changes at this time  Access Code: [facility] URL: [email or web address] Date: 11/23/2021 Prepared by: Precious Bard Exercises - Seated Gluteal Sets - 2 x daily - 7 x weekly - 2 sets - 10 reps - 5 hold - Seated Quad Set - 2 x daily - 7 x weekly - 2 sets - 10 reps - 5 hold - Seated Long Arc Quad - 2 x daily - 7 x weekly - 2 sets - 10 reps - 5 hold - Seated March - 2 x daily - 7 x weekly - 2 sets - 10 reps - 5 hold - Seated Hip Abduction - 2 x daily - 7 x weekly - 2 sets - 10 reps - 5 hold - Seated Shoulder Shrugs - 2 x daily - 7 x weekly - 2 sets - 10 reps - 5 hold -  Wheelchair Pressure Relief - 2 x daily - 7 x weekly - 2 sets - 10 reps - 5 hold  GOALS: Goals reviewed with patient? Yes  SHORT TERM GOALS: Target date: 02/22/2022  Pt will be independent with initial UE strengthening HEP in order to improve strength and balance in order to decrease fall risk and improve function at home and work. Baseline: 10/19/2021- patient with no formal UE HEP; 12/02/2021= Patient verbalized knowledge of HEP including use of theraband for UE strengthening.  Goal status: GOAL MET  LONG TERM GOALS: Target date: 12/29/2022  Pt will be independent with final for UE/LE HEP in order to improve strength and balance in order to decrease fall risk and improve function at home and work. Baseline: Patient is currently BLE NWB and unable to participate in HEP. Has order for  UE strengthening. 01/11/2022- Patient now able to participate in LE strengthening although NWB still- good understanding for some basic exercises. Will keep goal active to incorporate progressive LE strengthening exercises. 04/07/2022= Patient still participating in progressive LE seated HEP and has no questions at this time.  Goal status: MET  2.  Pt will improve FOTO to target score of 35  to display perceived improvements in ability to complete ADL's.  Baseline: 10/19/2021= 12; 12/02/2021= 17; 01/11/2022=17; 04/07/2022= 17; 07/05/2022=17; 10/06/2022- outcome measure not appropriate as patient in non-ambulatory and not consistently standing. Goal status: Goal not appropriate  3.  Pt will increase strength of B UE  by at least 1/2 MMT grade in order to demonstrate improvement in strength and function  Baseline: patient has range of 2-/5 to 4/5 BUE Strength; 12/02/2021= 4/5 except with wrist ext. 01/11/2022= 4/5 B UE strength expept for wrist Goal status: Goal revised-no longer appropriate- working with OT on all UE strengthening.   4. Pt. Will increase strength of RLE by at least 1/2 MMT grade in order to demonstrate  improvement in Standing/transfers.  Baseline: 2-/5 Right hip flex/knee ext/flex; 04/07/2022= 2- with right hip flex/knee ext (lacking 28 deg from zero) 4/8= Patient able to ext right knee lacking 18 deg from zero. 07/05/2022=Left knee approx 8 deg from zero and right knee = 23 deg from zero  Goal status: Progressing  5. Pt. Will demo ability to stand pivot transfer with max assist for improved functional mobility and less dependent need on mechanical device.   Baseline: Dependent on hoyer lift for all transfers; 04/07/2022- Unable to test secondary to patient with recent UTI and right hand procedure - will attempt to reassess next visit. ; 04/12/22: Unable to successfully stand due to feet plates on loaned power w/c; 05/10/2022- Patient still having to use rental chair and has not received her original power w/c back to practice SPT. 07/04/2021= Patient just received new power w/c and attempted standing from 1st time today- Patient able to stand with max assist today from w/c and did not attempt to perform pivot transfer- difficulty with static standing and placing weight on right LE. 10/06/2022- Not assessed today- patient needs new AFO prior to attempting transfers  Goal status: ONGOING  6. Pt. Will demonstrate improved functional LE strength as seen by ability to stand > 2 min for improved transfer ability and pregait abilities.   Baseline: not assessed today due to recent dx: UTI; 04/07/2022- Unable to test secondary to patient with recent UTI and right hand procedure - will attempt to reassess next visit; 04/12/22: Unable to successfully stand due to feet plates on loaned power w/c 05/10/2022- Patient still having to use rental chair and has not received her original power w/c back to practice Standing. 07/05/2022- Patient able to stand with max assist at trunk today for 2 min 3 sec. Will keep goal active to ensure consistency. 10/06/2022= Unable to assess today- patient with some recent blood pressure issues and needs new  AFO. 11/24/2022= Patient demonstrated multiple rounds of standing in // bars- holding up to 1:45 min today but has been able to exhibit 2 min in recent past- working on consistency.   Goal status: ONGOING  ASSESSMENT:  CLINICAL IMPRESSION: Pt completed seated therex during today's session. Due to experiencing a headache and R shoulder pain, static stands were not attempted today. Pt was able to tolerate all therex during today's session and pt is hopeful that symptoms from shingles vaccination will resolve by next visit  in order to attempt static standing. Pt will continue to benefit from skilled PT services to optimize independence and reduced caregiver support.    OBJECTIVE IMPAIRMENTS Abnormal gait, decreased activity tolerance, decreased balance, decreased coordination, decreased endurance, decreased mobility, difficulty walking, decreased ROM, decreased strength, hypomobility, impaired flexibility, impaired UE functional use, postural dysfunction, and pain.   ACTIVITY LIMITATIONS carrying, lifting, bending, sitting, standing, squatting, sleeping, stairs, transfers, bed mobility, continence, bathing, toileting, dressing, self feeding, reach over head, hygiene/grooming, and caring for others  PARTICIPATION LIMITATIONS: meal prep, cleaning, laundry, medication management, personal finances, interpersonal relationship, driving, shopping, community activity, and yard work  PERSONAL FACTORS Age, Time since onset of injury/illness/exacerbation, and 1-2 comorbidities: HTN, cervical Sx  are also affecting patient's functional outcome.   REHAB POTENTIAL: Good  CLINICAL DECISION MAKING: Evolving/moderate complexity  EVALUATION COMPLEXITY: Moderate  PLAN: PT FREQUENCY: 1-2x/week  PT DURATION: 12 weeks  PLANNED INTERVENTIONS: Therapeutic exercises, Therapeutic activity, Neuromuscular re-education, Balance training, Gait training, Patient/Family education, Self Care, Joint mobilization, DME  instructions, Dry Needling, Electrical stimulation, Wheelchair mobility training, Spinal mobilization, Cryotherapy, Moist heat, and Manual therapy  PLAN FOR NEXT SESSION: strength, core training, Sit to stand and static standing; progress to SPT as able.   Debara Pickett, SPT  This entire session was performed under direct supervision and direction of a licensed Estate agent . I have personally read, edited and approve of the note as written.   1:36 PM, 12/06/22 Louis Meckel, PT Physical Therapist - [facility]  Outpatient Physical Therapy- Main Campus [phone]

## 2022-12-08 ENCOUNTER — Ambulatory Visit: Payer: Medicare HMO | Admitting: Occupational Therapy

## 2022-12-08 ENCOUNTER — Ambulatory Visit: Payer: Medicare HMO

## 2022-12-08 DIAGNOSIS — M6281 Muscle weakness (generalized): Secondary | ICD-10-CM | POA: Diagnosis not present

## 2022-12-08 DIAGNOSIS — R2681 Unsteadiness on feet: Secondary | ICD-10-CM

## 2022-12-08 DIAGNOSIS — S14129S Central cord syndrome at unspecified level of cervical spinal cord, sequela: Secondary | ICD-10-CM

## 2022-12-08 DIAGNOSIS — R2689 Other abnormalities of gait and mobility: Secondary | ICD-10-CM

## 2022-12-08 DIAGNOSIS — S72001A Fracture of unspecified part of neck of right femur, initial encounter for closed fracture: Secondary | ICD-10-CM

## 2022-12-08 DIAGNOSIS — R262 Difficulty in walking, not elsewhere classified: Secondary | ICD-10-CM

## 2022-12-08 DIAGNOSIS — R269 Unspecified abnormalities of gait and mobility: Secondary | ICD-10-CM

## 2022-12-08 DIAGNOSIS — R296 Repeated falls: Secondary | ICD-10-CM

## 2022-12-08 DIAGNOSIS — R278 Other lack of coordination: Secondary | ICD-10-CM

## 2022-12-08 NOTE — Therapy (Signed)
OUTPATIENT PHYSICAL THERAPY NEURO TREATMENT    Patient Name: Sherry [name] MRN: [id number] DOB:1934/04/05, 88 y.o., female Today's Date: 12/08/2022   PCP: Sherry Mealing, MD REFERRING PROVIDER: Deeann [name]   PT End of Session - 12/08/22 1147     Visit Number 84    Number of Visits 92    Date for PT Re-Evaluation 12/29/22    Authorization Type [employer] FOTO performed by PT on eval (7/20), score 12, Progress note on 03/10/2020; PN on 08/20/2020    Authorization Time Period Initial cert [id number]/[id number]/9528; Recert 01/11/2022-04/05/2022; Recert 04/07/2022-06/30/2022    Progress Note Due on Visit 90    PT Start Time 1146    PT Stop Time 1221    PT Time Calculation (min) 35 min    Equipment Utilized During Treatment Gait belt    Activity Tolerance Patient tolerated treatment well;No increased pain;Patient limited by fatigue    Behavior During Therapy [facility] for tasks assessed/performed                   Past Medical History:  Diagnosis Date   Acute blood loss anemia    Arthritis    Cancer (HCC)    skin   Central cord syndrome at C6 level of cervical spinal cord (HCC) 11/29/2017   Hypertension    Protein-calorie malnutrition, severe (HCC) 01/24/2018   S/P insertion of IVC (inferior vena caval) filter 01/24/2018   Tetraparesis (HCC)    Past Surgical History:  Procedure Laterality Date   ANTERIOR CERVICAL DECOMP/DISCECTOMY FUSION N/A 11/29/2017   Procedure: Cervical five-six, six-seven Anterior Cervical Decompression Fusion;  Surgeon: Sherry Pierini, MD;  Location: [facility] OR;  Service: Neurosurgery;  Laterality: N/A;  Cervical five-six, six-seven Anterior Cervical Decompression Fusion   CATARACT EXTRACTION     FEMUR IM NAIL Left 08/16/2021   Procedure: INTRAMEDULLARY (IM) NAIL FEMORAL;  Surgeon: Sherry Saint, MD;  Location: [facility] ORS;  Service: Orthopedics;  Laterality: Left;   IR IVC FILTER PLMT / S&I /IMG GUID/MOD SED  12/08/2017   Patient Active Problem List    Diagnosis Date Noted   Age-related osteoporosis without current pathological fracture 07/27/2022   Mild recurrent major depression (HCC) 07/27/2022   Hypertensive heart disease with other congestive heart failure (HCC) 02/14/2022   Chronic indwelling Foley catheter 02/14/2022   Closed hip fracture (HCC) 08/15/2021   DVT (deep venous thrombosis) (HCC) 08/15/2021   Quadriplegia (HCC) 08/15/2021   Fall 08/15/2021   Constipation due to slow transit 08/31/2018   Trauma 06/05/2018   Neuropathic pain 06/05/2018   Neurogenic bowel 06/05/2018   Vaginal yeast infection 01/30/2018   Healthcare-associated pneumonia 01/25/2018   Chronic allergic rhinitis 01/24/2018   Depression with anxiety 01/24/2018   UTI due to Klebsiella species 01/24/2018   Protein-calorie malnutrition, severe (HCC) 01/24/2018   S/P insertion of IVC (inferior vena caval) filter 01/24/2018   Tetraparesis (HCC) 01/20/2018   Neurogenic bladder 01/20/2018   Reactive depression    Benign essential HTN    Acute postoperative anemia due to expected blood loss    Central cord syndrome at C6 level of cervical spinal cord (HCC) 11/29/2017   Allergy to alpha-gal 11/25/2016   SCC (squamous cell carcinoma) 04/08/2014    ONSET DATE: 08/15/2021 (fall with B hip fx); Initial injury was in 2019- quadraplegia due to central cord syndrome of C6 secondary to neck fracture.   REFERRING DIAG: Bilateral Hip fx; Left Open- s/p Left IM nail femoral on 08/16/2021; Right closed  THERAPY DIAG:  Muscle  weakness (generalized)  Other abnormalities of gait and mobility  Difficulty in walking, not elsewhere classified  Central cord syndrome, sequela (HCC)  Closed fracture of both hips, initial encounter (HCC)  Abnormality of gait and mobility  Repeated falls  Other lack of coordination  Unsteadiness on feet  Rationale for Evaluation and Treatment Rehabilitation  SUBJECTIVE:   SUBJECTIVE STATEMENT:  Pt stated that she is feeling  good today. Pt reported no significant changes since last visit.      Pt accompanied by:  Paid caregiver-   PERTINENT HISTORY: Sherry [name] is an 87yoF who experienced a fall at home on 08/15/2021 with hip fracture, s/p Left hip ORIF. PMH: quadriplegia due to central cord syndrome of C6 secondary to neck fracture, HTN, depression with anxiety, neurogenic bladder, bilateral DVT on Xarelto (s/p of IVC filter placement). Prior history of significant PT/OT with improvement of function. Pt has full use of shoulder/elbows, limited use of hands, adaptive self feeding sucessful.     PAIN:  None current but does endorse some left thigh pain intermittent  OBJECTIVE:    TODAY'S TREATMENT:  12/06/2022     Therapeutic exercises: Active Assisted Seated marches 15 reps each LE Seated LAQ 1x12 each LE AAROM Seated AAROM Dorsiflexion 10 reps each LE Seated gastroc stretch BLE 30 seconds x3  Seated Hamstring stretch - hold 30 sec x 2 each LE Seated AAROM knee extension 10 reps LLE   TA:  Static standing x 2 trials with 2 person assist - 2 minutes each. Less difficulty achieving erect position without pain- Performed in // bars             PATIENT EDUCATION: Education details: cues for isolated joint exercises  HOME EXERCISE PROGRAM: No changes at this time  Access Code: [facility] URL: [email or web address] Date: 11/23/2021 Prepared by: Sherry [name] Exercises - Seated Gluteal Sets - 2 x daily - 7 x weekly - 2 sets - 10 reps - 5 hold - Seated Quad Set - 2 x daily - 7 x weekly - 2 sets - 10 reps - 5 hold - Seated Long Arc Quad - 2 x daily - 7 x weekly - 2 sets - 10 reps - 5 hold - Seated March - 2 x daily - 7 x weekly - 2 sets - 10 reps - 5 hold - Seated Hip Abduction - 2 x daily - 7 x weekly - 2 sets - 10 reps - 5 hold - Seated Shoulder Shrugs - 2 x daily - 7 x weekly - 2 sets - 10 reps - 5 hold - Wheelchair Pressure Relief - 2 x daily - 7 x weekly - 2 sets - 10 reps - 5  hold  GOALS: Goals reviewed with patient? Yes  SHORT TERM GOALS: Target date: 02/22/2022  Pt will be independent with initial UE strengthening HEP in order to improve strength and balance in order to decrease fall risk and improve function at home and work. Baseline: 10/19/2021- patient with no formal UE HEP; 12/02/2021= Patient verbalized knowledge of HEP including use of theraband for UE strengthening.  Goal status: GOAL MET  LONG TERM GOALS: Target date: 12/29/2022  Pt will be independent with final for UE/LE HEP in order to improve strength and balance in order to decrease fall risk and improve function at home and work. Baseline: Patient is currently BLE NWB and unable to participate in HEP. Has order for UE strengthening. 01/11/2022- Patient now able to participate in LE strengthening although NWB still- good  understanding for some basic exercises. Will keep goal active to incorporate progressive LE strengthening exercises. 04/07/2022= Patient still participating in progressive LE seated HEP and has no questions at this time.  Goal status: MET  2.  Pt will improve FOTO to target score of 35  to display perceived improvements in ability to complete ADL's.  Baseline: 10/19/2021= 12; 12/02/2021= 17; 01/11/2022=17; 04/07/2022= 17; 07/05/2022=17; 10/06/2022- outcome measure not appropriate as patient in non-ambulatory and not consistently standing. Goal status: Goal not appropriate  3.  Pt will increase strength of B UE  by at least 1/2 MMT grade in order to demonstrate improvement in strength and function  Baseline: patient has range of 2-/5 to 4/5 BUE Strength; 12/02/2021= 4/5 except with wrist ext. 01/11/2022= 4/5 B UE strength expept for wrist Goal status: Goal revised-no longer appropriate- working with OT on all UE strengthening.   4. Pt. Will increase strength of RLE by at least 1/2 MMT grade in order to demonstrate improvement in Standing/transfers.  Baseline: 2-/5 Right hip flex/knee ext/flex;  04/07/2022= 2- with right hip flex/knee ext (lacking 28 deg from zero) 4/8= Patient able to ext right knee lacking 18 deg from zero. 07/05/2022=Left knee approx 8 deg from zero and right knee = 23 deg from zero  Goal status: Progressing  5. Pt. Will demo ability to stand pivot transfer with max assist for improved functional mobility and less dependent need on mechanical device.   Baseline: Dependent on hoyer lift for all transfers; 04/07/2022- Unable to test secondary to patient with recent UTI and right hand procedure - will attempt to reassess next visit. ; 04/12/22: Unable to successfully stand due to feet plates on loaned power w/c; 05/10/2022- Patient still having to use rental chair and has not received her original power w/c back to practice SPT. 07/04/2021= Patient just received new power w/c and attempted standing from 1st time today- Patient able to stand with max assist today from w/c and did not attempt to perform pivot transfer- difficulty with static standing and placing weight on right LE. 10/06/2022- Not assessed today- patient needs new AFO prior to attempting transfers  Goal status: ONGOING  6. Pt. Will demonstrate improved functional LE strength as seen by ability to stand > 2 min for improved transfer ability and pregait abilities.   Baseline: not assessed today due to recent dx: UTI; 04/07/2022- Unable to test secondary to patient with recent UTI and right hand procedure - will attempt to reassess next visit; 04/12/22: Unable to successfully stand due to feet plates on loaned power w/c 05/10/2022- Patient still having to use rental chair and has not received her original power w/c back to practice Standing. 07/05/2022- Patient able to stand with max assist at trunk today for 2 min 3 sec. Will keep goal active to ensure consistency. 10/06/2022= Unable to assess today- patient with some recent blood pressure issues and needs new AFO. 11/24/2022= Patient demonstrated multiple rounds of standing in // bars-  holding up to 1:45 min today but has been able to exhibit 2 min in recent past- working on consistency.   Goal status: ONGOING  ASSESSMENT:  CLINICAL IMPRESSION: Pt was able to tolerate all tasks during today's visit. Pt was able to tolerate static standing for up to approximately 2 minutes during each trial, which is a progression from when static standing was last attempted. Pt was highly motivated to attempt to come into standing today. Pt will continue to benefit from skilled PT services to optimize independence and  reduced caregiver support.    OBJECTIVE IMPAIRMENTS Abnormal gait, decreased activity tolerance, decreased balance, decreased coordination, decreased endurance, decreased mobility, difficulty walking, decreased ROM, decreased strength, hypomobility, impaired flexibility, impaired UE functional use, postural dysfunction, and pain.   ACTIVITY LIMITATIONS carrying, lifting, bending, sitting, standing, squatting, sleeping, stairs, transfers, bed mobility, continence, bathing, toileting, dressing, self feeding, reach over head, hygiene/grooming, and caring for others  PARTICIPATION LIMITATIONS: meal prep, cleaning, laundry, medication management, personal finances, interpersonal relationship, driving, shopping, community activity, and yard work  PERSONAL FACTORS Age, Time since onset of injury/illness/exacerbation, and 1-2 comorbidities: HTN, cervical Sx  are also affecting patient's functional outcome.   REHAB POTENTIAL: Good  CLINICAL DECISION MAKING: Evolving/moderate complexity  EVALUATION COMPLEXITY: Moderate  PLAN: PT FREQUENCY: 1-2x/week  PT DURATION: 12 weeks  PLANNED INTERVENTIONS: Therapeutic exercises, Therapeutic activity, Neuromuscular re-education, Balance training, Gait training, Patient/Family education, Self Care, Joint mobilization, DME instructions, Dry Needling, Electrical stimulation, Wheelchair mobility training, Spinal mobilization, Cryotherapy, Moist  heat, and Manual therapy  PLAN FOR NEXT SESSION: strength, core training, Sit to stand and static standing; progress to SPT as able.   Debara Pickett, SPT  This entire session was performed under direct supervision and direction of a licensed Estate agent . I have personally read, edited and approve of the note as written.   1:04 PM, 12/08/22 Louis Meckel, PT Physical Therapist - [facility]  Outpatient Physical Therapy- Main Campus [phone]

## 2022-12-08 NOTE — Therapy (Signed)
Occupational Therapy Neuro Treatment Note   Patient Name: Sherry [name] MRN: [id number] DOB:August 11, 1934, 88 y.o., female Today's Date: 11/29/2022  PCP:  Cindi Carbon PROVIDER: Earnestine Mealing, MD  END OF SESSION:   OT End of Session - 12/08/22 1212     Visit Number 91    Number of Visits 120    Date for OT Re-Evaluation 01/19/23    OT Start Time 1100    OT Stop Time 1145    OT Time Calculation (min) 45 min    Activity Tolerance Patient tolerated treatment well    Behavior During Therapy WFL for tasks assessed/performed                 Past Medical History:  Diagnosis Date   Acute blood loss anemia    Arthritis    Cancer (HCC)    skin   Central cord syndrome at C6 level of cervical spinal cord (HCC) 11/29/2017   Hypertension    Protein-calorie malnutrition, severe (HCC) 01/24/2018   S/P insertion of IVC (inferior vena caval) filter 01/24/2018   Tetraparesis (HCC)    Past Surgical History:  Procedure Laterality Date   ANTERIOR CERVICAL DECOMP/DISCECTOMY FUSION N/A 11/29/2017   Procedure: Cervical five-six, six-seven Anterior Cervical Decompression Fusion;  Surgeon: Jadene Pierini, MD;  Location: [facility] OR;  Service: Neurosurgery;  Laterality: N/A;  Cervical five-six, six-seven Anterior Cervical Decompression Fusion   CATARACT EXTRACTION     FEMUR IM NAIL Left 08/16/2021   Procedure: INTRAMEDULLARY (IM) NAIL FEMORAL;  Surgeon: Deeann Saint, MD;  Location: [facility] ORS;  Service: Orthopedics;  Laterality: Left;   IR IVC FILTER PLMT / S&I /IMG GUID/MOD SED  12/08/2017   Patient Active Problem List   Diagnosis Date Noted   Age-related osteoporosis without current pathological fracture 07/27/2022   Mild recurrent major depression (HCC) 07/27/2022   Hypertensive heart disease with other congestive heart failure (HCC) 02/14/2022   Chronic indwelling Foley catheter 02/14/2022   Closed hip fracture (HCC) 08/15/2021   DVT (deep venous thrombosis) (HCC) 08/15/2021    Quadriplegia (HCC) 08/15/2021   Fall 08/15/2021   Constipation due to slow transit 08/31/2018   Trauma 06/05/2018   Neuropathic pain 06/05/2018   Neurogenic bowel 06/05/2018   Vaginal yeast infection 01/30/2018   Healthcare-associated pneumonia 01/25/2018   Chronic allergic rhinitis 01/24/2018   Depression with anxiety 01/24/2018   UTI due to Klebsiella species 01/24/2018   Protein-calorie malnutrition, severe (HCC) 01/24/2018   S/P insertion of IVC (inferior vena caval) filter 01/24/2018   Tetraparesis (HCC) 01/20/2018   Neurogenic bladder 01/20/2018   Reactive depression    Benign essential HTN    Acute postoperative anemia due to expected blood loss    Central cord syndrome at C6 level of cervical spinal cord (HCC) 11/29/2017   Allergy to alpha-gal 11/25/2016   SCC (squamous cell carcinoma) 04/08/2014    ONSET DATE: 11/29/2017  REFERRING DIAG: Central Cord Syndrome at C6 of the Cervical Spinal Cord, Fall with Bilateral Closed Hip Fractures, with ORIF repair with Intramedullary Nailing of the Left Hip Fracture.   THERAPY DIAG:  Muscle weakness (generalized)  Other lack of coordination  Rationale for Evaluation and Treatment: Rehabilitation  SUBJECTIVE:     SUBJECTIVE STATEMENT:  Pt. presents feeling okay today    Pt accompanied by: self, Personal Care aide  PERTINENT HISTORY: Central Cord Syndrome at C6 of the Cervical Spinal Cord, Fall with Bilateral Closed Hip Fractures, with ORIF repair with Intramedullary Nailing of the Left Hip Fracture.  PRECAUTIONS: None  WEIGHT BEARING RESTRICTIONS: No  PAIN:  Are you having pain? No   LIVING ENVIRONMENT: Lives with: [facility]  Stairs: No Has following equipment at home: Wheelchair (power)  PLOF: Independent   PATIENT GOALS:  See below for established goals  OBJECTIVE:  Note: Objective measures were completed at Evaluation unless otherwise noted.  HAND DOMINANCE:  Right  ADLs: Caregiver assist with ADLs, and [facility]  MOBILITY STATUS: Uses a power w/c  ACTIVITY TOLERANCE: Activity tolerance: Fair  FUNCTIONAL OUTCOME MEASURES:  Measurements:   12/09/2021:   Shoulder flexion: R: 120(136), L: 138(150) Shoulder abduction: R: 108(120), L: 110(120) Elbow: R: 0-148, L: 0-146 Wrist flexion: R: 55(62), L: 78 Writ extension: R: 34(55), L: 3(4) RD: R: 12(20), L: 16(26) UD: R: 8(24), L: 6(18) Thumb radial abduction: R: 11(20), L: 8(20) Digit flexion to the [facility]: R:  2nd: 5cm(3.5cm), 3rd: 7.5 cm(5cm), 4th: 7cm(3cm), 5th: 6cm(5.5cm) L: 2nd: 8cm(8cm), 3rd: 8cm(8cm), 4th: 7.5cm(7cm), 5th: 7cm(7cm)   02/03/2022:   Shoulder flexion: R: 122(138), L: 148(154) Shoulder abduction: R: 109(125), L: 125(125 Elbow: R: 0-148, L: 0-146 Wrist flexion: R: 60(68), L: 79 Writ extension: R: 40(55), L: 3(10) RD: R: 14(24), L: 20(28) UD: R: 8(24), L: 6(18) Thumb radial abduction: R: 20(25), L: 8(20) Digit flexion to the [facility]: R:  2nd: 4.5cm(3cm), 3rd: 6.5cm(4.5cm), 4th: 6.5 cm(3cm), 5th: 4.5cm(5cm) L: 2nd: 8cm(8cm), 3rd: 8cm(7cm), 4th: 7.5cm(7cm), 5th: 6.5cm(6.5cm)   04/07/2022:   Shoulder flexion: R: 125(150), L: 160(160) Shoulder abduction: R: 109(125), L: 125(125 Elbow: R: 0-148, L: 0-146 Wrist flexion: R: 60(68), L: 79 Writ extension: R: 40(55), L: 5(18) RD: R: 14(24), L: 20(28) UD: R: 18(24), L: 8(18) Thumb radial abduction: R: 20(25), L: 8(20) Digit flexion to the [facility]: R:  2nd: 4.5 cm(4 cm), 3rd: 6.5cm(3cm), 4th: 7 cm (5cm), 5th: 5cm(5cm) L: 2nd: 8cm(8cm), 3rd: 8cm(7cm), 4th: 7.5cm(7cm), 5th: 7cm(7cm)   07/05/2022:   Shoulder flexion: R: 125(154), L: 160(160) Shoulder abduction: R: 95(130), L: 143(143) Elbow: R: 0-148, L: 0-146 Wrist flexion: R: 60(68), L: 79 Writ extension: R: 40(60), L: 0(12) RD: R: 10(24), L: 12(20) UD: R: 16(24), L: 8(16) Thumb radial abduction: R: 20(25), L: 8(20) Digit flexion to the [facility]: R:  2nd: 4 cm(4cm), 3rd:  6.5cm( 3.5cm), 4th: 7 cm (5cm), 5th: 6cm(5cm) L: 2nd: 8cm(8cm), 3rd: 8cm(7cm), 4th: 8cm(7cm), 5th: 7cm(7cm)   09/13/2022:   Shoulder flexion: R: 125(154), L: 160(160) Shoulder abduction: R: 100(130), L: 143(143) Elbow: R: 0-150, L: 0-150 Wrist flexion: R: 55(68), L: 79 Writ extension: R: 45(60), L: 0(12) RD: R: 10(24), L: 12(20) UD: R: 16(24), L: 8(16) Thumb radial abduction: R: 20(25), L: 8(20) Digit flexion to the [facility]: R:  2nd: 4 cm(4cm), 3rd: 6cm( 5cm), 4th: 6 cm (4cm), 5th: 5cm(5cm) L: 2nd: 7cm(cm), 3rd: 7cm(6cm), 4th: 7cm(6cm), 5th: 6cm(6cm)   10/27/2022   Shoulder flexion: R: 128(154), L: 160(160) Shoulder abduction: R: 105(130), L: 143(143) Elbow: R: 0-150, L: 0-150 Wrist flexion: R: 55(68), L: 79 Writ extension: R: 46(64), L: 0(20) RD: R: 10(20), L: 18(24) UD: R: 118(28), L: 8(20) Thumb radial abduction: R: 30(42), L:20(24) Digit flexion to the [facility]: R:  2nd: 4 cm(3cm), 3rd: 6cm( 6cm), 4th: 6 cm (5cm), 5th: 5cm(5cm) L: 2nd: 8cm(8cm), 3rd: 8cm(6cm), 4th: 8cm(7cm), 5th: 6cm(6cm)      COORDINATION:  Impaired 2/2 bilateral hand digit MP, PIP, and DIP extension tightness  SENSATION: Intact  EDEMA: N/A   COGNITION: Overall cognitive status: WNL   VISION: Subjective report:  Wears glasses, No changes in vision.   PERCEPTION: Intact  TODAY'S TREATMENT:                                                                                                                              DATE: 12/08/2022   Manual Therapy:   Pt. tolerated soft tissue massage to the volar, and dorsal surface of each digit on the bilateral hands, and thenar eminence secondary to stiffness following moist heat modality. Soft tissue mobilization was performed for carpal, and metacarpal spread stretches. Manual therapy was performed independent of, and in preparation for therapeutic Ex.     Therapeutic Ex.    Pt. tolerated PROM followed by AROM bilateral wrist extension, PROM for bilateral digit  MP, PIP, and DIP flexion, and extension, thumb radial, and palmar abduction. Pt. performed 2# dowel ex. For UE strengthening secondary to weakness. Bilateral shoulder flexion, chest press, circular patterns, and elbow flexion/extension were performed.    PATIENT EDUCATION: Education details: Bilateral hand function, ROM, and strengthening Person educated: Patient Education method: Explanation, Demonstration, and Verbal cues Education comprehension: verbalized understanding and returned demonstration  HOME EXERCISE PROGRAM: Continue to assess HEP needs, and provide as needed.    GOALS: Goals reviewed with patient? Yes    LONG TERM GOALS: Target date: 01/19/2023  3.  Patient will demonstrate improved composite finger flexion to be able to firmly hold and use adaptive devices during ADLs, and IADLs.  Baseline: 12/06/2022: Pt. Continues to present with increased MP, PIP, and DIP digit tightness/stiffness limiting the formulation of bilateral composites fists. 10/27/2022: Pt. presents with increased MP, PIP, and DIP digit tightness/stiffness limiting the formulation of bilateral composites fists during this progress reporting period limiting. Digit flexion to the [facility]: R:  2nd: 4 cm(3cm), 3rd: 6cm( 6cm), 4th: 6 cm (5cm), 5th: 5cm(5cm), L: 2nd: 8cm(8cm), 3rd: 8cm(6cm), 4th: 8cm(7cm), 5th: 6cm(6cm) 09/20/2022: Improving digit flexion to the [facility]. 09/13/22: improved digit flexion to the [facility]. 07/05/2022: Pt. presents with digit MP, PIP, and DIP extensor tightness limiting her ability to securely grip objects in her bilateral hands.  04/07/2022: Pt. Has improved with right 2nd, and 3rd digit flexion towards the [facility]. Pt. Continues to have difficulty securely holding and applying deodorant.02/03/2022: Pt. Has improved with digit flexion, however, continues to have difficulty securely holding and using deodorant. 12/09/2021: Bilateral hand/digit MP, PIP, and DIP extension tightness limits her ability achieve digit  flexion to hold, and apply deodorant. 10/28/2021:  Pt. continues to have difficulty holding the deodorant. 01/12/2021: Pt. Presents with limited digit extension. Pt. Is able to initiate holding deodorant, however is unable to hold it while using it  Goal status: Ongoing   4.  Pt. Will improve bilateral wrist extension in preparation for anticipating, and initiating reaching for objects at the table.  Baseline: 12/06/2022: Pt continues to to progress with bilateral wrist extension in preparation fro functional reaching. 10/27/2022: Pt. Is improving with bilateral wrist extension. R: 46(64) L: 0(20)  09/20/2022: limited PROM in the Left wrist extension 2/2 tightness/stiffness. 09/13/22: R: 55(68), L: 0(12) 07/19/2022: Bilateral wrist extension in limited. 07/05/2022: Right: 40(60) Left: 0(12) 04/07/2022: Right: 40(55) Left: 5(18) 02/03/2022: Right: 34(55) Left: 3(4) 12/09/2021: Right  34(55)  Left  3(4) 10/28/2021:  Right 22(38), Left 0(15) 09/14/2021:  Right: 17(35), left 2(15)  Goal status: Ongoing  7.  Pt. Will increase bilateral lateral pinch strength by 2 lbs to be able to securely grasp items during ADLs, and IADL tasks.  Baseline: 12/06/2022: Pt. Is able to more securely hold objects during ADLs/IADLs 10/27/2022: Pt. Is improving with holding items with her bilateral thumbs. (Pinch meter out for calibration) 09/20/2022: lateral pinch continues to be limited 09/13/22: R 6.5# L 4# 07/19/2022: TBD 07/05/2022: TBD 07/2022: NT-Pinch meter out for calibration. 02/03/2022: Right: 6#, Left: 4# 12/09/2021: Right: 6#, Left: 4#  Goal status: Ongoing   9.  Pt. will complete plant care with modified independence.  Baseline:12/06/2022: Pt. Is independent with plant care in her husband's room, however is not able to access her plants due to furniture blocking access to them. 10/27/2022: Pt. Is able to water plants that are closer, and within reach. Pt. continues to have difficulty reaching for thorough plant care. 09/20/2022: Pt.  Continues to have difficulty reaching for thorough plant care. 09/13/22: continues to report intermittent difficulty 07/19/2022: Pt. Continues to be able to water, and care for some of her plants. Pt. Has more difficulty with plants that are harder to reach. 07/05/2022: pt. Is able to water, and care for some of her plants. Pt. Has more difficulty with plants that are harder to reach. 04/07/2022: Pt. is now able to hold a cup and water her plants.02/03/2022: Pt. has difficulty caring for her plants.  Goal status:Achieved   10.  Pt. will demonstrate adaptive techniques to assist with the efficiency of self-dressing, or morning care tasks.  Baseline: 12/06/2022: Pt. requires assist from staff, as Pt. Reports staff have decreased time. 10/27/2022: Pt. is able to assist with initiating UE dressing. Pt. requires assist from personal, and staff care aides. 09/20/2022: Pt. continues to require assist form personal, and staff care aides. 09/13/22: MODA dressing, reports she is often rushed 07/19/2022: Pt. continues to require assist with self-dressing/morning care tasks. 07/05/2022: Continue with goal. 04/07/2022: Pt. Continues to require assist with the efficiency of self-dressing, and morning care tasks.02/03/2022: Pt. requires assist from caregivers 2/2 time limitations during morning care.  Goal status: Ongoing  11.  Pt. will improve BUE strength to improve ADL, and IADL functioning.  Baseline: 12/06/2022: Continue 10/27/2022: shoulder flexion L 4/5, R 3+/5; shoulder abduction L 4+/5, R 3+/5. elbow flexion B 5/5, elbow extension 4/5 09/20/2022: Pt. Presents with limited BUE strength 09/13/22: shoulder flexion L 4/5, R 3/5; elbow flexion B 5/5, elbow extension 4-/5; shoulder abduction L 4+/5, R 3+/5. 07/19/2022: BUE strength continues to be limited. 07/05/2022: shoulder flexion: right 4-/5, abduction: 3+/5, elbow flexion: right: 5/5, left 5/5, extension: right: 4-/5, left 4-/5, wrist extension: right: 3-/5, left: 2-/5  Goal  status: Ongoing  12.  Pt. will improve bilateral thumb radial abduction in order to be able to hold the grab bars while standing with PT  Baseline: 12/06/2022: Pt. Continues to present  with limited thumb abduction, however is improving holding onto the parallel bars.10/27/2022: thumb radial abduction: R: 30(42), L: 20(24  Goal status: Ongoing  13.  Pt. Will be able to securely hold, and stabilize medication bottles at a tabletop surface when opening, and closing them.  Baseline: 12/06/2022: Pt is improving with securely stabilizing medication bottles. 10/27/2022: Pt. Stabilizes bottles against her torso when attempting to open, and close them   Goal status:Ongoing    ASSESSMENT:  CLINICAL IMPRESSION:  Pt.'s headache has improved. Pt. reports no pain today. Pt. continues to have a slight pink reaction to the shingles vaccine in her right shoulder. Pt. is making progress with engaging, and using her hands more during ADL, and IADL tasks. Pt. reports no pain today. Pt. is tolerating the exercises well. Pt. continues to present with tightness at the bilateral thumb webspace, and bilateral wrist flexor tightness, as well as digit MP, PIP, and DIP extensor tightness which continues to limit her ability to complete ADL tasks efficiently. Pt. continues to benefit from working on impoving ROM, and UE strength in order to work towards increasing bilateral hand grasp on objects, and increasing engagement of bilateral hands during ADLs, and IADL tasks.    PERFORMANCE DEFICITS: in functional skills including ADLs, IADLs, coordination, dexterity, ROM, strength, and UE functional use, cognitive skills including , and psychosocial skills including coping strategies and environmental adaptation.   IMPAIRMENTS: are limiting patient from ADLs, IADLs, and leisure.   CO-MORBIDITIES: may have co-morbidities  that affects occupational performance. Patient will benefit from skilled OT to address above impairments and  improve overall function.  MODIFICATION OR ASSISTANCE TO COMPLETE EVALUATION: Min-Moderate modification of tasks or assist with assess necessary to complete an evaluation.  OT OCCUPATIONAL PROFILE AND HISTORY: Detailed assessment: Review of records and additional review of physical, cognitive, psychosocial history related to current functional performance.  CLINICAL DECISION MAKING: Moderate - several treatment options, min-mod task modification necessary  REHAB POTENTIAL: Good for stated goals    PLAN:  OT FREQUENCY 2x's a week  OT DURATION: 12 weeks  PLANNED INTERVENTIONS ADL training, A/E training, UE ther. Ex, Manual therapy, neuromuscular re-education, moist heat modality, Paraffin Bath, Splinting, and  Pt./caregiver education   RECOMMENDED OTHER SERVICES: PT  CONSULTED AND AGREED WITH PLAN OF CARE: Patient  PLAN FOR NEXT SESSION: Continue Treatment as per established POC   Olegario Messier, MS, OTR/L 12/08/2022, 2:16 PM

## 2022-12-13 ENCOUNTER — Ambulatory Visit: Payer: Medicare HMO | Admitting: Occupational Therapy

## 2022-12-13 ENCOUNTER — Ambulatory Visit: Payer: Medicare HMO

## 2022-12-13 ENCOUNTER — Non-Acute Institutional Stay (SKILLED_NURSING_FACILITY): Payer: Medicare HMO | Admitting: Adult Health

## 2022-12-13 ENCOUNTER — Encounter: Payer: Self-pay | Admitting: Adult Health

## 2022-12-13 DIAGNOSIS — I959 Hypotension, unspecified: Secondary | ICD-10-CM

## 2022-12-13 NOTE — Progress Notes (Unsigned)
Location:  Other ([facility] Room Number: 219 A Place of Service:  SNF ([phone]) Provider:  Kenard Gower, DNP, FNP-BC  Patient Care Team: Earnestine Mealing, MD as PCP - General (Family Medicine)  Extended Emergency Contact Information Primary Emergency Contact: [name],[name] Mobile Phone: [phone] Relation: Daughter Secondary Emergency Contact: [name] Address: [address]          [place], [place] [address] [place] of [place] Home Phone: [phone] Mobile Phone: [phone] Relation: Spouse  Code Status:  ***  Goals of care: Advanced Directive information    11/17/2022    1:26 PM  Advanced Directives  Does Patient Have a Medical Advance Directive? Yes  Type of Estate agent of [place];Out of facility DNR (pink MOST or yellow form);Living will  Does patient want to make changes to medical advance directive? No - Patient declined  Copy of Healthcare Power of Attorney in Chart? Yes - validated most recent copy scanned in chart (See row information)     Chief Complaint  Patient presents with   Acute Visit    Low blood pressure    HPI:  Pt is a 88 y.o. Sherry [name] seen today for medical management of chronic diseases.  ***   Past Medical History:  Diagnosis Date   Acute blood loss anemia    Arthritis    Cancer (HCC)    skin   Central cord syndrome at C6 level of cervical spinal cord (HCC) 11/29/2017   Hypertension    Protein-calorie malnutrition, severe (HCC) 01/24/2018   S/P insertion of IVC (inferior vena caval) filter 01/24/2018   Tetraparesis (HCC)    Past Surgical History:  Procedure Laterality Date   ANTERIOR CERVICAL DECOMP/DISCECTOMY FUSION N/A 11/29/2017   Procedure: Cervical five-six, six-seven Anterior Cervical Decompression Fusion;  Surgeon: Jadene Pierini, MD;  Location: [facility] OR;  Service: Neurosurgery;  Laterality: N/A;  Cervical five-six, six-seven Anterior Cervical  Decompression Fusion   CATARACT EXTRACTION     FEMUR IM NAIL Left 08/16/2021   Procedure: INTRAMEDULLARY (IM) NAIL FEMORAL;  Surgeon: Deeann Saint, MD;  Location: [facility] ORS;  Service: Orthopedics;  Laterality: Left;   IR IVC FILTER PLMT / S&I /IMG GUID/MOD SED  12/08/2017    Allergies  Allergen Reactions   Other Swelling   Lisinopril-Hydrochlorothiazide Swelling    Swelling of the tongue   Beef-Derived Products Swelling    Facial and lip swelling (due to tick bite)   Dairy Aid [Tilactase] Swelling   Lisinopril-Hydrochlorothiazide Swelling    Tongue swelling   Pork-Derived Products Swelling    Outpatient Encounter Medications as of 12/13/2022  Medication Sig   acetaminophen (TYLENOL) 500 MG tablet Take 500 mg by mouth every 12 (twelve) hours as needed for mild pain.   acetaminophen (TYLENOL) 650 MG CR tablet Take 650 mg by mouth every 8 (eight) hours as needed for pain.   acetic acid 0.25 % irrigation Use 30ml via irrigation twice daily to keep foley Cath patent.   alendronate (FOSAMAX) 70 MG tablet Take 70 mg by mouth every Saturday.   calcium carbonate (CALCIUM 600) 600 MG TABS tablet Take 600 mg by mouth at bedtime.   Capsaicin 0.1 % CREA Apply 1 application  topically every evening. (Apply to hands)   cetirizine (ZYRTEC) 5 MG tablet Take 5 mg by mouth 2 (two) times daily.   cholecalciferol (VITAMIN D3) 25 MCG (1000 UNIT) tablet Take 1,000 Units by mouth daily.   DULoxetine (CYMBALTA) 60 MG capsule Take  60 mg by mouth daily.   ferrous sulfate 325 (65 FE) MG tablet Take 325 mg by mouth daily with breakfast.   fluocinonide (LIDEX) 0.05 % external solution Apply 1 Application topically every 12 (twelve) hours as needed.   gabapentin (NEURONTIN) 100 MG capsule Take 1 capsule (100 mg total) by mouth daily.   hydroxypropyl methylcellulose / hypromellose (ISOPTO TEARS / GONIOVISC) 2.5 % ophthalmic solution Place 1 drop into both eyes every hour as needed for dry eyes.   Infant Care  Products [facility]) OINT Apply to buttocks/gluteal folds topically every shift.   ketoconazole (NIZORAL) 2 % shampoo Apply 1 Application topically 2 (two) times a week.   lactulose, encephalopathy, (CHRONULAC) 10 GM/15ML SOLN Take 30 mLs by mouth daily as needed (mild constipation).   magnesium citrate solution Take 60-120 mLs by mouth daily as needed (constipation).   methylcellulose oral powder Take 1 packet by mouth at bedtime.   mometasone (ELOCON) 0.1 % lotion Apply 1 Application topically every Saturday at 6 PM. (Apply to the ear canal for eczema)   Multiple Vitamin (MULTIVITAMIN WITH MINERALS) TABS tablet Take 1 tablet by mouth daily.   olopatadine (PATADAY) 0.1 % ophthalmic solution Place 1 drop into both eyes 2 (two) times daily.   polyethylene glycol (MIRALAX / GLYCOLAX) 17 g packet Take 17 g by mouth 2 (two) times daily.   rivaroxaban (XARELTO) 10 MG TABS tablet Take 10 mg by mouth daily.   saccharomyces boulardii (FLORASTOR) 250 MG capsule Take 250 mg by mouth daily.   Saline Spray 0.2 % SOLN Place 2 sprays into both nostrils as needed (nasal congestion).   simethicone (MYLICON) 80 MG chewable tablet Chew 80 mg by mouth every 6 (six) hours as needed for flatulence.   sodium phosphate (FLEET) 7-19 GM/118ML ENEM Place 1 enema rectally every other day as needed for severe constipation.   spironolactone (ALDACTONE) 25 MG tablet Take 0.5 tablets (12.5 mg total) by mouth daily.   tiZANidine (ZANAFLEX) 2 MG tablet Take 1 tablet (2 mg total) by mouth 2 (two) times daily.   triamcinolone cream (KENALOG) 0.1 % Apply to thigh and lower legs topically every 8 hours as needed. Apply to back of neck topically as needed.   Zinc Oxide (TRIPLE PASTE) 12.8 % ointment Apply 1 Application topically. Every shift.   No facility-administered encounter medications on file as of 12/13/2022.    Review of Systems ***    Immunization History  Administered Date(s) Administered   Influenza, High Dose  Seasonal PF 11/17/2021   Influenza-Unspecified 11/14/2018, 11/20/2019, 11/17/2020, 11/17/2021   Moderna Covid-19 Fall Seasonal Vaccine 59yrs & older 05/11/2022   Moderna Covid-19 Vaccine Bivalent Booster 30yrs & up 06/30/2021, 12/11/2021, 10/29/2022   Moderna Sars-Covid-2 Vaccination 02/09/2019, 03/09/2019, 12/14/2019, 06/20/2020, 10/24/2020   PPD Test 04/Sherry/2021   Tdap 11/29/2017   Zoster Recombinant(Shingrix) 09/02/2022   Pertinent  Health Maintenance Due  Topic Date Due   INFLUENZA VACCINE  09/02/2022   DEXA SCAN  Discontinued      08/19/2021   10:00 AM 08/19/2021    8:54 PM 08/20/2021    8:20 AM 03/29/2022   10:11 AM 08/31/2022    2:42 PM  Fall Risk  Falls in the past year?    0 0  Was there an injury with Fall?    0   Fall Risk Category Calculator    0   (RETIRED) Patient Fall Risk Level High fall risk High fall risk High fall risk    Patient at Risk  for Falls Due to    No Fall Risks      Vitals:   12/13/22 1559  BP: (!) 77/36  Pulse: 77  Resp: 18  Temp: 97.8 F (36.6 C)  SpO2: 96%  Weight: 181 lb (82.1 kg)  Height: 5' 8.5" (1.74 m)   Body mass index is 27.12 kg/m.  Physical Exam     Labs reviewed: Recent Labs    04/21/22 1200 06/02/22 0000 09/29/22 0000 11/04/22 0000  NA 133* 140 139 139  K 3.8 4.6 3.8 3.8  CL 100 102 100 100  CO2 30 31* 29* 32*  GLUCOSE 108*  --   --   --   BUN 8 13 10 9   CREATININE 0.68 0.5 0.5 0.5  CALCIUM 8.0* 9.0 8.6* 8.8   Recent Labs    02/08/22 0000 09/29/22 0000  AST 18 20  ALT Sherry Sherry  ALKPHOS 65 58  ALBUMIN 3.7 3.6   Recent Labs    03/08/22 0000 04/21/22 1200 04/21/22 1731 04/26/22 0000 05/03/22 0000 05/10/22 0000 05/17/22 0000 09/30/22 0000  WBC 6.2 7.1  --  5.7  --   --   --  4.7  NEUTROABS 3,683.00  --   --  3,283.00  --   --   --  2,627.00  HGB 11.9* 8.5*   < > 8.3*   < > 10.0* 10.7* 13.2  HCT 35* 26.6*   < > 26*   < > 31* 33* 41  MCV  --  99.3  --   --   --   --   --   --   PLT 360 348  --  444*   --   --   --  280   < > = values in this interval not displayed.   Lab Results  Component Value Date   TSH 1.31 09/29/2022   No results found for: "HGBA1C" No results found for: "CHOL", "HDL", "LDLCALC", "LDLDIRECT", "TRIG", "CHOLHDL"  Significant Diagnostic Results in last 30 days:  No results found.  Assessment/Plan ***   Family/ staff Communication: Discussed plan of care with resident and charge nurse  Labs/tests ordered:     Kenard Gower, DNP, MSN, FNP-BC [facility] [phone] (Monday-Friday 8:00 a.m. - 5:00 p.m.) [phone] (after hours)

## 2022-12-14 MED ORDER — MIDODRINE HCL 2.5 MG PO TABS: 2.5000 mg | ORAL_TABLET | Freq: Three times a day (TID) | ORAL | Status: DC | PRN

## 2022-12-15 ENCOUNTER — Ambulatory Visit: Payer: Medicare HMO | Admitting: Occupational Therapy

## 2022-12-15 ENCOUNTER — Ambulatory Visit: Payer: Medicare HMO

## 2022-12-15 DIAGNOSIS — S72001A Fracture of unspecified part of neck of right femur, initial encounter for closed fracture: Secondary | ICD-10-CM

## 2022-12-15 DIAGNOSIS — R2689 Other abnormalities of gait and mobility: Secondary | ICD-10-CM

## 2022-12-15 DIAGNOSIS — R278 Other lack of coordination: Secondary | ICD-10-CM

## 2022-12-15 DIAGNOSIS — R269 Unspecified abnormalities of gait and mobility: Secondary | ICD-10-CM

## 2022-12-15 DIAGNOSIS — S14129S Central cord syndrome at unspecified level of cervical spinal cord, sequela: Secondary | ICD-10-CM

## 2022-12-15 DIAGNOSIS — M6281 Muscle weakness (generalized): Secondary | ICD-10-CM

## 2022-12-15 DIAGNOSIS — R262 Difficulty in walking, not elsewhere classified: Secondary | ICD-10-CM

## 2022-12-15 NOTE — Therapy (Signed)
OUTPATIENT PHYSICAL THERAPY NEURO TREATMENT    Patient Name: Sherry [name] MRN: [id number] DOB:02/25/1934, 88 y.o., female Today's Date: 12/16/2022   PCP: Earnestine Mealing, MD REFERRING PROVIDER: Deeann Saint   PT End of Session - 12/15/22 1153     Visit Number 85    Number of Visits 92    Date for PT Re-Evaluation 12/29/22    Authorization Type [employer]na medicare FOTO performed by PT on eval (7/20), score 12, Progress note on 03/10/2020; PN on 08/20/2020    Authorization Time Period Initial cert 05/10/8117-14/78/2956; Recert 01/11/2022-04/05/2022; Recert 04/07/2022-06/30/2022    Progress Note Due on Visit 90    PT Start Time 1147    PT Stop Time 1222    PT Time Calculation (min) 35 min    Equipment Utilized During Treatment Gait belt    Activity Tolerance Patient tolerated treatment well;No increased pain;Patient limited by fatigue    Behavior During Therapy [facility] for tasks assessed/performed                    Past Medical History:  Diagnosis Date   Acute blood loss anemia    Arthritis    Cancer (HCC)    skin   Central cord syndrome at C6 level of cervical spinal cord (HCC) 11/29/2017   Hypertension    Protein-calorie malnutrition, severe (HCC) 01/24/2018   S/P insertion of IVC (inferior vena caval) filter 01/24/2018   Tetraparesis (HCC)    Past Surgical History:  Procedure Laterality Date   ANTERIOR CERVICAL DECOMP/DISCECTOMY FUSION N/A 11/29/2017   Procedure: Cervical five-six, six-seven Anterior Cervical Decompression Fusion;  Surgeon: Jadene Pierini, MD;  Location: [facility] OR;  Service: Neurosurgery;  Laterality: N/A;  Cervical five-six, six-seven Anterior Cervical Decompression Fusion   CATARACT EXTRACTION     FEMUR IM NAIL Left 08/16/2021   Procedure: INTRAMEDULLARY (IM) NAIL FEMORAL;  Surgeon: Deeann Saint, MD;  Location: [facility] ORS;  Service: Orthopedics;  Laterality: Left;   IR IVC FILTER PLMT / S&I /IMG GUID/MOD SED  12/08/2017   Patient Active Problem  List   Diagnosis Date Noted   Age-related osteoporosis without current pathological fracture 07/27/2022   Mild recurrent major depression (HCC) 07/27/2022   Hypertensive heart disease with other congestive heart failure (HCC) 02/14/2022   Chronic indwelling Foley catheter 02/14/2022   Closed hip fracture (HCC) 08/15/2021   DVT (deep venous thrombosis) (HCC) 08/15/2021   Quadriplegia (HCC) 08/15/2021   Fall 08/15/2021   Constipation due to slow transit 08/31/2018   Trauma 06/05/2018   Neuropathic pain 06/05/2018   Neurogenic bowel 06/05/2018   Vaginal yeast infection 01/30/2018   Healthcare-associated pneumonia 01/25/2018   Chronic allergic rhinitis 01/24/2018   Depression with anxiety 01/24/2018   UTI due to Klebsiella species 01/24/2018   Protein-calorie malnutrition, severe (HCC) 01/24/2018   S/P insertion of IVC (inferior vena caval) filter 01/24/2018   Tetraparesis (HCC) 01/20/2018   Neurogenic bladder 01/20/2018   Reactive depression    Benign essential HTN    Acute postoperative anemia due to expected blood loss    Central cord syndrome at C6 level of cervical spinal cord (HCC) 11/29/2017   Allergy to alpha-gal 11/25/2016   SCC (squamous cell carcinoma) 04/08/2014    ONSET DATE: 08/15/2021 (fall with B hip fx); Initial injury was in 2019- quadraplegia due to central cord syndrome of C6 secondary to neck fracture.   REFERRING DIAG: Bilateral Hip fx; Left Open- s/p Left IM nail femoral on 08/16/2021; Right closed  THERAPY DIAG:  Muscle weakness (generalized)  Other abnormalities of gait and mobility  Difficulty in walking, not elsewhere classified  Central cord syndrome, sequela (HCC)  Closed fracture of both hips, initial encounter (HCC)  Abnormality of gait and mobility  Rationale for Evaluation and Treatment Rehabilitation  SUBJECTIVE:   SUBJECTIVE STATEMENT:  Patient reports her blood pressure has been really low this week. States does not feel up to  standing or transferring today. She was agreeable to continue to focus on her LE strength.    Pt accompanied by:  Paid caregiver-   PERTINENT HISTORY: Sherry [name] is an 87yoF who experienced a fall at home on 08/15/2021 with hip fracture, s/p Left hip ORIF. PMH: quadriplegia due to central cord syndrome of C6 secondary to neck fracture, HTN, depression with anxiety, neurogenic bladder, bilateral DVT on Xarelto (s/p of IVC filter placement). Prior history of significant PT/OT with improvement of function. Pt has full use of shoulder/elbows, limited use of hands, adaptive self feeding sucessful.     PAIN:  None current but does endorse some left thigh pain intermittent  OBJECTIVE:   BP= 131/65 mmHg Left UE at rest (seated)   TODAY'S TREATMENT:  12/15/2022     Therapeutic exercises: Active Assisted Seated marches 2 x 15 reps each LE Seated LAQ 2x15 each LE AAROM on right and AROM on Left Seated AAROM Dorsiflexion  2 x 15 reps each LE Seated gastroc stretch BLE 30 seconds x3  Seated Hamstring stretch - hold 30 sec x 2 each LE Seated AAROM knee extension 10 reps LLE Seated manual resistive Hip add/ABD x 15 reps with 5 sec hold each Seated assisted heel slide x 15 reps each LE                PATIENT EDUCATION: Education details: cues for isolated joint exercises  HOME EXERCISE PROGRAM: No changes at this time  Access Code: [facility] URL: [email or web address] Date: 11/23/2021 Prepared by: Precious Bard Exercises - Seated Gluteal Sets - 2 x daily - 7 x weekly - 2 sets - 10 reps - 5 hold - Seated Quad Set - 2 x daily - 7 x weekly - 2 sets - 10 reps - 5 hold - Seated Long Arc Quad - 2 x daily - 7 x weekly - 2 sets - 10 reps - 5 hold - Seated March - 2 x daily - 7 x weekly - 2 sets - 10 reps - 5 hold - Seated Hip Abduction - 2 x daily - 7 x weekly - 2 sets - 10 reps - 5 hold - Seated Shoulder Shrugs - 2 x daily - 7 x weekly - 2 sets - 10 reps - 5 hold - Wheelchair  Pressure Relief - 2 x daily - 7 x weekly - 2 sets - 10 reps - 5 hold  GOALS: Goals reviewed with patient? Yes  SHORT TERM GOALS: Target date: 02/22/2022  Pt will be independent with initial UE strengthening HEP in order to improve strength and balance in order to decrease fall risk and improve function at home and work. Baseline: 10/19/2021- patient with no formal UE HEP; 12/02/2021= Patient verbalized knowledge of HEP including use of theraband for UE strengthening.  Goal status: GOAL MET  LONG TERM GOALS: Target date: 12/29/2022  Pt will be independent with final for UE/LE HEP in order to improve strength and balance in order to decrease fall risk and improve function at home and work. Baseline: Patient is currently BLE NWB and unable to participate  in HEP. Has order for UE strengthening. 01/11/2022- Patient now able to participate in LE strengthening although NWB still- good understanding for some basic exercises. Will keep goal active to incorporate progressive LE strengthening exercises. 04/07/2022= Patient still participating in progressive LE seated HEP and has no questions at this time.  Goal status: MET  2.  Pt will improve FOTO to target score of 35  to display perceived improvements in ability to complete ADL's.  Baseline: 10/19/2021= 12; 12/02/2021= 17; 01/11/2022=17; 04/07/2022= 17; 07/05/2022=17; 10/06/2022- outcome measure not appropriate as patient in non-ambulatory and not consistently standing. Goal status: Goal not appropriate  3.  Pt will increase strength of B UE  by at least 1/2 MMT grade in order to demonstrate improvement in strength and function  Baseline: patient has range of 2-/5 to 4/5 BUE Strength; 12/02/2021= 4/5 except with wrist ext. 01/11/2022= 4/5 B UE strength expept for wrist Goal status: Goal revised-no longer appropriate- working with OT on all UE strengthening.   4. Pt. Will increase strength of RLE by at least 1/2 MMT grade in order to demonstrate improvement in  Standing/transfers.  Baseline: 2-/5 Right hip flex/knee ext/flex; 04/07/2022= 2- with right hip flex/knee ext (lacking 28 deg from zero) 4/8= Patient able to ext right knee lacking 18 deg from zero. 07/05/2022=Left knee approx 8 deg from zero and right knee = 23 deg from zero  Goal status: Progressing  5. Pt. Will demo ability to stand pivot transfer with max assist for improved functional mobility and less dependent need on mechanical device.   Baseline: Dependent on hoyer lift for all transfers; 04/07/2022- Unable to test secondary to patient with recent UTI and right hand procedure - will attempt to reassess next visit. ; 04/12/22: Unable to successfully stand due to feet plates on loaned power w/c; 05/10/2022- Patient still having to use rental chair and has not received her original power w/c back to practice SPT. 07/04/2021= Patient just received new power w/c and attempted standing from 1st time today- Patient able to stand with max assist today from w/c and did not attempt to perform pivot transfer- difficulty with static standing and placing weight on right LE. 10/06/2022- Not assessed today- patient needs new AFO prior to attempting transfers  Goal status: ONGOING  6. Pt. Will demonstrate improved functional LE strength as seen by ability to stand > 2 min for improved transfer ability and pregait abilities.   Baseline: not assessed today due to recent dx: UTI; 04/07/2022- Unable to test secondary to patient with recent UTI and right hand procedure - will attempt to reassess next visit; 04/12/22: Unable to successfully stand due to feet plates on loaned power w/c 05/10/2022- Patient still having to use rental chair and has not received her original power w/c back to practice Standing. 07/05/2022- Patient able to stand with max assist at trunk today for 2 min 3 sec. Will keep goal active to ensure consistency. 10/06/2022= Unable to assess today- patient with some recent blood pressure issues and needs new AFO.  11/24/2022= Patient demonstrated multiple rounds of standing in // bars- holding up to 1:45 min today but has been able to exhibit 2 min in recent past- working on consistency.   Goal status: ONGOING  ASSESSMENT:  CLINICAL IMPRESSION: Patient declined to work on standing today secondary to dealing with some low blood pressure issues earlier this week. She reported feeling okay today but just not up to standing. She presented with some ongoing fluid in LE - instructed her  to consult with MD in [facility] facility. She worked hard on her seated LE strengthening attempting to perform as full of ROM on each LE as possible. Extra fluid made her legs feel heavier today but she was able to complete all reps.  Pt will continue to benefit from skilled PT services to optimize independence and reduced caregiver support.    OBJECTIVE IMPAIRMENTS Abnormal gait, decreased activity tolerance, decreased balance, decreased coordination, decreased endurance, decreased mobility, difficulty walking, decreased ROM, decreased strength, hypomobility, impaired flexibility, impaired UE functional use, postural dysfunction, and pain.   ACTIVITY LIMITATIONS carrying, lifting, bending, sitting, standing, squatting, sleeping, stairs, transfers, bed mobility, continence, bathing, toileting, dressing, self feeding, reach over head, hygiene/grooming, and caring for others  PARTICIPATION LIMITATIONS: meal prep, cleaning, laundry, medication management, personal finances, interpersonal relationship, driving, shopping, community activity, and yard work  PERSONAL FACTORS Age, Time since onset of injury/illness/exacerbation, and 1-2 comorbidities: HTN, cervical Sx  are also affecting patient's functional outcome.   REHAB POTENTIAL: Good  CLINICAL DECISION MAKING: Evolving/moderate complexity  EVALUATION COMPLEXITY: Moderate  PLAN: PT FREQUENCY: 1-2x/week  PT DURATION: 12 weeks  PLANNED INTERVENTIONS: Therapeutic exercises,  Therapeutic activity, Neuromuscular re-education, Balance training, Gait training, Patient/Family education, Self Care, Joint mobilization, DME instructions, Dry Needling, Electrical stimulation, Wheelchair mobility training, Spinal mobilization, Cryotherapy, Moist heat, and Manual therapy  PLAN FOR NEXT SESSION:  strength, core training, Sit to stand and static standing; progress to SPT as able.     7:50 AM, 12/16/22 Louis Meckel, PT Physical Therapist - [facility]  Outpatient Physical Therapy- Main Campus [phone]

## 2022-12-15 NOTE — Therapy (Signed)
Occupational Therapy Neuro Treatment Note   Patient Name: Sherry [name] MRN: [id number] DOB:11/09/1934, 88 y.o., female Today's Date: 11/29/2022  PCP:  Cindi Carbon PROVIDER: Earnestine Mealing, MD  END OF SESSION:   OT End of Session - 12/15/22 1223     Visit Number [id number]    Number of Visits 120    Date for OT Re-Evaluation 01/19/23    OT Start Time 1100    OT Stop Time 1145    OT Time Calculation (min) 45 min    Behavior During Therapy [facility] for tasks assessed/performed                 Past Medical History:  Diagnosis Date   Acute blood loss anemia    Arthritis    Cancer (HCC)    skin   Central cord syndrome at C6 level of cervical spinal cord (HCC) 11/29/2017   Hypertension    Protein-calorie malnutrition, severe (HCC) 01/24/2018   S/P insertion of IVC (inferior vena caval) filter 01/24/2018   Tetraparesis (HCC)    Past Surgical History:  Procedure Laterality Date   ANTERIOR CERVICAL DECOMP/DISCECTOMY FUSION N/A 11/29/2017   Procedure: Cervical five-six, six-seven Anterior Cervical Decompression Fusion;  Surgeon: Jadene Pierini, MD;  Location: [facility] OR;  Service: Neurosurgery;  Laterality: N/A;  Cervical five-six, six-seven Anterior Cervical Decompression Fusion   CATARACT EXTRACTION     FEMUR IM NAIL Left 08/16/2021   Procedure: INTRAMEDULLARY (IM) NAIL FEMORAL;  Surgeon: Deeann Saint, MD;  Location: [facility] ORS;  Service: Orthopedics;  Laterality: Left;   IR IVC FILTER PLMT / S&I /IMG GUID/MOD SED  12/08/2017   Patient Active Problem List   Diagnosis Date Noted   Age-related osteoporosis without current pathological fracture 07/27/2022   Mild recurrent major depression (HCC) 07/27/2022   Hypertensive heart disease with other congestive heart failure (HCC) 02/14/2022   Chronic indwelling Foley catheter 02/14/2022   Closed hip fracture (HCC) 08/15/2021   DVT (deep venous thrombosis) (HCC) 08/15/2021   Quadriplegia (HCC) 08/15/2021   Fall 08/15/2021    Constipation due to slow transit 08/31/2018   Trauma 06/05/2018   Neuropathic pain 06/05/2018   Neurogenic bowel 06/05/2018   Vaginal yeast infection 01/30/2018   Healthcare-associated pneumonia 01/25/2018   Chronic allergic rhinitis 01/24/2018   Depression with anxiety 01/24/2018   UTI due to Klebsiella species 01/24/2018   Protein-calorie malnutrition, severe (HCC) 01/24/2018   S/P insertion of IVC (inferior vena caval) filter 01/24/2018   Tetraparesis (HCC) 01/20/2018   Neurogenic bladder 01/20/2018   Reactive depression    Benign essential HTN    Acute postoperative anemia due to expected blood loss    Central cord syndrome at C6 level of cervical spinal cord (HCC) 11/29/2017   Allergy to alpha-gal 11/25/2016   SCC (squamous cell carcinoma) 04/08/2014    ONSET DATE: 11/29/2017  REFERRING DIAG: Central Cord Syndrome at C6 of the Cervical Spinal Cord, Fall with Bilateral Closed Hip Fractures, with ORIF repair with Intramedullary Nailing of the Left Hip Fracture.   THERAPY DIAG:  Muscle weakness (generalized)  Other lack of coordination  Rationale for Evaluation and Treatment: Rehabilitation  SUBJECTIVE:   SUBJECTIVE STATEMENT:  Pt. presents feeling okay today    Pt accompanied by: self, Personal Care aide  PERTINENT HISTORY: Central Cord Syndrome at C6 of the Cervical Spinal Cord, Fall with Bilateral Closed Hip Fractures, with ORIF repair with Intramedullary Nailing of the Left Hip Fracture.   PRECAUTIONS: None  WEIGHT BEARING RESTRICTIONS: No  PAIN:  Are you having pain? No   LIVING ENVIRONMENT: Lives with: [facility]  Stairs: No Has following equipment at home: Wheelchair (power)  PLOF: Independent   PATIENT GOALS:  See below for established goals  OBJECTIVE:  Note: Objective measures were completed at Evaluation unless otherwise noted.  HAND DOMINANCE: Right  ADLs: Caregiver assist with ADLs, and [facility]  MOBILITY  STATUS: Uses a power w/c  ACTIVITY TOLERANCE: Activity tolerance: Fair  FUNCTIONAL OUTCOME MEASURES:  Measurements:   12/09/2021:   Shoulder flexion: R: 120(136), L: 138(150) Shoulder abduction: R: 108(120), L: 110(120) Elbow: R: 0-148, L: 0-146 Wrist flexion: R: 55(62), L: 78 Writ extension: R: 34(55), L: 3(4) RD: R: 12(20), L: 16(26) UD: R: 8(24), L: 6(18) Thumb radial abduction: R: 11(20), L: 8(20) Digit flexion to the [facility]: R:  2nd: 5cm(3.5cm), 3rd: 7.5 cm(5cm), 4th: 7cm(3cm), 5th: 6cm(5.5cm) L: 2nd: 8cm(8cm), 3rd: 8cm(8cm), 4th: 7.5cm(7cm), 5th: 7cm(7cm)   02/03/2022:   Shoulder flexion: R: 122(138), L: 148(154) Shoulder abduction: R: 109(125), L: 125(125 Elbow: R: 0-148, L: 0-146 Wrist flexion: R: 60(68), L: 79 Writ extension: R: 40(55), L: 3(10) RD: R: 14(24), L: 20(28) UD: R: 8(24), L: 6(18) Thumb radial abduction: R: 20(25), L: 8(20) Digit flexion to the [facility]: R:  2nd: 4.5cm(3cm), 3rd: 6.5cm(4.5cm), 4th: 6.5 cm(3cm), 5th: 4.5cm(5cm) L: 2nd: 8cm(8cm), 3rd: 8cm(7cm), 4th: 7.5cm(7cm), 5th: 6.5cm(6.5cm)   04/07/2022:   Shoulder flexion: R: 125(150), L: 160(160) Shoulder abduction: R: 109(125), L: 125(125 Elbow: R: 0-148, L: 0-146 Wrist flexion: R: 60(68), L: 79 Writ extension: R: 40(55), L: 5(18) RD: R: 14(24), L: 20(28) UD: R: 18(24), L: 8(18) Thumb radial abduction: R: 20(25), L: 8(20) Digit flexion to the [facility]: R:  2nd: 4.5 cm(4 cm), 3rd: 6.5cm(3cm), 4th: 7 cm (5cm), 5th: 5cm(5cm) L: 2nd: 8cm(8cm), 3rd: 8cm(7cm), 4th: 7.5cm(7cm), 5th: 7cm(7cm)   07/05/2022:   Shoulder flexion: R: 125(154), L: 160(160) Shoulder abduction: R: 95(130), L: 143(143) Elbow: R: 0-148, L: 0-146 Wrist flexion: R: 60(68), L: 79 Writ extension: R: 40(60), L: 0(12) RD: R: 10(24), L: 12(20) UD: R: 16(24), L: 8(16) Thumb radial abduction: R: 20(25), L: 8(20) Digit flexion to the [facility]: R:  2nd: 4 cm(4cm), 3rd: 6.5cm( 3.5cm), 4th: 7 cm (5cm), 5th: 6cm(5cm) L: 2nd: 8cm(8cm), 3rd:  8cm(7cm), 4th: 8cm(7cm), 5th: 7cm(7cm)   09/13/2022:   Shoulder flexion: R: 125(154), L: 160(160) Shoulder abduction: R: 100(130), L: 143(143) Elbow: R: 0-150, L: 0-150 Wrist flexion: R: 55(68), L: 79 Writ extension: R: 45(60), L: 0(12) RD: R: 10(24), L: 12(20) UD: R: 16(24), L: 8(16) Thumb radial abduction: R: 20(25), L: 8(20) Digit flexion to the [facility]: R:  2nd: 4 cm(4cm), 3rd: 6cm( 5cm), 4th: 6 cm (4cm), 5th: 5cm(5cm) L: 2nd: 7cm(cm), 3rd: 7cm(6cm), 4th: 7cm(6cm), 5th: 6cm(6cm)   10/27/2022   Shoulder flexion: R: 128(154), L: 160(160) Shoulder abduction: R: 105(130), L: 143(143) Elbow: R: 0-150, L: 0-150 Wrist flexion: R: 55(68), L: 79 Writ extension: R: 46(64), L: 0(20) RD: R: 10(20), L: 18(24) UD: R: 118(28), L: 8(20) Thumb radial abduction: R: 30(42), L:20(24) Digit flexion to the [facility]: R:  2nd: 4 cm(3cm), 3rd: 6cm( 6cm), 4th: 6 cm (5cm), 5th: 5cm(5cm) L: 2nd: 8cm(8cm), 3rd: 8cm(6cm), 4th: 8cm(7cm), 5th: 6cm(6cm)      COORDINATION:  Impaired 2/2 bilateral hand digit MP, PIP, and DIP extension tightness  SENSATION: Intact  EDEMA: N/A   COGNITION: Overall cognitive status: WNL   VISION: Subjective report: Wears glasses, No changes in vision.   PERCEPTION: Intact  TODAY'S TREATMENT:                                                                                                                              DATE: 12/15/2022   Manual Therapy:   Pt. tolerated soft tissue massage to the volar, and dorsal surface of each digit on the bilateral hands, and thenar eminence secondary to stiffness following moist heat modality. Soft tissue mobilization was performed for carpal, and metacarpal spread stretches. Manual therapy was performed independent of, and in preparation for therapeutic Ex.     Therapeutic Ex.    Pt. tolerated PROM followed by AROM bilateral wrist extension, PROM for bilateral digit MP, PIP, and DIP flexion, and extension, thumb radial, and palmar  abduction. Pt. performed 2.5# dowel ex. for UE strengthening secondary to weakness. Bilateral shoulder flexion, chest press, circular patterns, and elbow flexion/extension were performed.  Pt. worked on Marketing executive for using a lateral pinch grasp,    PATIENT EDUCATION: Education details: Bilateral hand function, ROM, and strengthening Person educated: Patient Education method: Explanation, Demonstration, and Verbal cues Education comprehension: verbalized understanding and returned demonstration  HOME EXERCISE PROGRAM: Continue to assess HEP needs, and provide as needed.    GOALS: Goals reviewed with patient? Yes    LONG TERM GOALS: Target date: 01/19/2023  3.  Patient will demonstrate improved composite finger flexion to be able to firmly hold and use adaptive devices during ADLs, and IADLs.  Baseline: 12/06/2022: Pt. continues to present with increased MP, PIP, and DIP digit tightness/stiffness limiting the formulation of bilateral composites fists. 10/27/2022: Pt. presents with increased MP, PIP, and DIP digit tightness/stiffness limiting the formulation of bilateral composites fists during this progress reporting period limiting. Digit flexion to the [facility]: R:  2nd: 4 cm(3cm), 3rd: 6cm( 6cm), 4th: 6 cm (5cm), 5th: 5cm(5cm), L: 2nd: 8cm(8cm), 3rd: 8cm(6cm), 4th: 8cm(7cm), 5th: 6cm(6cm) 09/20/2022: Improving digit flexion to the [facility]. 09/13/22: improved digit flexion to the [facility]. 07/05/2022: Pt. presents with digit MP, PIP, and DIP extensor tightness limiting her ability to securely grip objects in her bilateral hands.  04/07/2022: Pt. Has improved with right 2nd, and 3rd digit flexion towards the [facility]. Pt. Continues to have difficulty securely holding and applying deodorant.02/03/2022: Pt. Has improved with digit flexion, however, continues to have difficulty securely holding and using deodorant. 12/09/2021: Bilateral hand/digit MP, PIP, and DIP extension tightness limits her ability achieve  digit flexion to hold, and apply deodorant. 10/28/2021:  Pt. continues to have difficulty holding the deodorant. 01/12/2021: Pt. Presents with limited digit extension. Pt. Is able to initiate holding deodorant, however is unable to hold it while using it  Goal status: Ongoing   4.  Pt. Will improve bilateral wrist extension in preparation for anticipating, and initiating reaching for objects at the table.  Baseline: 12/06/2022: Pt continues to to progress with bilateral wrist extension in preparation fro functional reaching. 10/27/2022: Pt. Is improving with bilateral wrist extension. R: 46(64) L:  0(20) 09/20/2022: limited PROM in the Left wrist extension 2/2 tightness/stiffness. 09/13/22: R: 55(68), L: 0(12) 07/19/2022: Bilateral wrist extension in limited. 07/05/2022: Right: 40(60) Left: 0(12) 04/07/2022: Right: 40(55) Left: 5(18) 02/03/2022: Right: 34(55) Left: 3(4) 12/09/2021: Right  34(55)  Left  3(4) 10/28/2021:  Right 22(38), Left 0(15) 09/14/2021:  Right: 17(35), left 2(15)  Goal status: Ongoing  7.  Pt. Will increase bilateral lateral pinch strength by 2 lbs to be able to securely grasp items during ADLs, and IADL tasks.  Baseline: 12/06/2022: Pt. Is able to more securely hold objects during ADLs/IADLs 10/27/2022: Pt. Is improving with holding items with her bilateral thumbs. (Pinch meter out for calibration) 09/20/2022: lateral pinch continues to be limited 09/13/22: R 6.5# L 4# 07/19/2022: TBD 07/05/2022: TBD 07/2022: NT-Pinch meter out for calibration. 02/03/2022: Right: 6#, Left: 4# 12/09/2021: Right: 6#, Left: 4#  Goal status: Ongoing   9.  Pt. will complete plant care with modified independence.  Baseline:12/06/2022: Pt. Is independent with plant care in her husband's room, however is not able to access her plants due to furniture blocking access to them. 10/27/2022: Pt. Is able to water plants that are closer, and within reach. Pt. continues to have difficulty reaching for thorough plant care. 09/20/2022:  Pt. Continues to have difficulty reaching for thorough plant care. 09/13/22: continues to report intermittent difficulty 07/19/2022: Pt. Continues to be able to water, and care for some of her plants. Pt. Has more difficulty with plants that are harder to reach. 07/05/2022: pt. Is able to water, and care for some of her plants. Pt. Has more difficulty with plants that are harder to reach. 04/07/2022: Pt. is now able to hold a cup and water her plants.02/03/2022: Pt. has difficulty caring for her plants.  Goal status:Achieved   10.  Pt. will demonstrate adaptive techniques to assist with the efficiency of self-dressing, or morning care tasks.  Baseline: 12/06/2022: Pt. requires assist from staff, as Pt. Reports staff have decreased time. 10/27/2022: Pt. is able to assist with initiating UE dressing. Pt. requires assist from personal, and staff care aides. 09/20/2022: Pt. continues to require assist form personal, and staff care aides. 09/13/22: MODA dressing, reports she is often rushed 07/19/2022: Pt. continues to require assist with self-dressing/morning care tasks. 07/05/2022: Continue with goal. 04/07/2022: Pt. Continues to require assist with the efficiency of self-dressing, and morning care tasks.02/03/2022: Pt. requires assist from caregivers 2/2 time limitations during morning care.  Goal status: Ongoing  11.  Pt. will improve BUE strength to improve ADL, and IADL functioning.  Baseline: 12/06/2022: Continue 10/27/2022: shoulder flexion L 4/5, R 3+/5; shoulder abduction L 4+/5, R 3+/5. elbow flexion B 5/5, elbow extension 4/5 09/20/2022: Pt. Presents with limited BUE strength 09/13/22: shoulder flexion L 4/5, R 3/5; elbow flexion B 5/5, elbow extension 4-/5; shoulder abduction L 4+/5, R 3+/5. 07/19/2022: BUE strength continues to be limited. 07/05/2022: shoulder flexion: right 4-/5, abduction: 3+/5, elbow flexion: right: 5/5, left 5/5, extension: right: 4-/5, left 4-/5, wrist extension: right: 3-/5, left: 2-/5  Goal  status: Ongoing  12.  Pt. will improve bilateral thumb radial abduction in order to be able to hold the grab bars while standing with PT  Baseline: 12/06/2022: Pt. Continues to present  with limited thumb abduction, however is improving holding onto the parallel bars.10/27/2022: thumb radial abduction: R: 30(42), L: 20(24  Goal status: Ongoing  13.  Pt. Will be able to securely hold, and stabilize medication bottles at a tabletop surface when opening, and closing  them.  Baseline: 12/06/2022: Pt is improving with securely stabilizing medication bottles. 10/27/2022: Pt. Stabilizes bottles against her torso when attempting to open, and close them   Goal status:Ongoing    ASSESSMENT:  CLINICAL IMPRESSION:  Pt.that she was up all night on Sunday night with her husband who has Pneumonia.  Pt. continues to have a slight pink reaction to the shingles vaccine in her right shoulder. Pt. is making progress with engaging, and using her hands more during ADL, and IADL tasks. Pt. reports no pain today. Pt. is tolerating the exercises well. Pt. continues to present with tightness at the bilateral thumb webspace, bilateral wrist flexor tightness, as well as digit MP, PIP, and DIP extensor tightness which continues to limit her ability to complete ADL tasks efficiently. Pt. continues to benefit from working on impoving ROM, and UE strength in order to work towards increasing bilateral hand grasp on objects, and increasing engagement of bilateral hands during ADLs, and IADL tasks.    PERFORMANCE DEFICITS: in functional skills including ADLs, IADLs, coordination, dexterity, ROM, strength, and UE functional use, cognitive skills including , and psychosocial skills including coping strategies and environmental adaptation.   IMPAIRMENTS: are limiting patient from ADLs, IADLs, and leisure.   CO-MORBIDITIES: may have co-morbidities  that affects occupational performance. Patient will benefit from skilled OT to address  above impairments and improve overall function.  MODIFICATION OR ASSISTANCE TO COMPLETE EVALUATION: Min-Moderate modification of tasks or assist with assess necessary to complete an evaluation.  OT OCCUPATIONAL PROFILE AND HISTORY: Detailed assessment: Review of records and additional review of physical, cognitive, psychosocial history related to current functional performance.  CLINICAL DECISION MAKING: Moderate - several treatment options, min-mod task modification necessary  REHAB POTENTIAL: Good for stated goals    PLAN:  OT FREQUENCY 2x's a week  OT DURATION: 12 weeks  PLANNED INTERVENTIONS ADL training, A/E training, UE ther. Ex, Manual therapy, neuromuscular re-education, moist heat modality, Paraffin Bath, Splinting, and  Pt./caregiver education   RECOMMENDED OTHER SERVICES: PT  CONSULTED AND AGREED WITH PLAN OF CARE: Patient  PLAN FOR NEXT SESSION: Continue Treatment as per established POC   Olegario Messier, MS, OTR/L 12/15/2022, 2:16 PM

## 2022-12-20 ENCOUNTER — Ambulatory Visit: Payer: Medicare HMO | Admitting: Occupational Therapy

## 2022-12-20 ENCOUNTER — Ambulatory Visit: Payer: Medicare HMO

## 2022-12-20 DIAGNOSIS — M6281 Muscle weakness (generalized): Secondary | ICD-10-CM

## 2022-12-20 DIAGNOSIS — R2689 Other abnormalities of gait and mobility: Secondary | ICD-10-CM

## 2022-12-20 DIAGNOSIS — S72001A Fracture of unspecified part of neck of right femur, initial encounter for closed fracture: Secondary | ICD-10-CM

## 2022-12-20 DIAGNOSIS — S14129S Central cord syndrome at unspecified level of cervical spinal cord, sequela: Secondary | ICD-10-CM

## 2022-12-20 DIAGNOSIS — R262 Difficulty in walking, not elsewhere classified: Secondary | ICD-10-CM

## 2022-12-20 NOTE — Therapy (Signed)
Occupational Therapy Neuro Treatment Note   Patient Name: Sherry [name] MRN: [id number] DOB:04-23-1934, 88 y.o., female Today's Date: 11/29/2022  PCP:  Cindi Carbon PROVIDER: Earnestine Mealing, MD  END OF SESSION:   OT End of Session - 12/20/22 1629     Visit Number [id number]    Number of Visits 120    Date for OT Re-Evaluation 01/19/23    OT Start Time 1100    OT Stop Time 1145    OT Time Calculation (min) 45 min    Equipment Utilized During Treatment powered wheelchair    Activity Tolerance Patient tolerated treatment well    Behavior During Therapy WFL for tasks assessed/performed                 Past Medical History:  Diagnosis Date   Acute blood loss anemia    Arthritis    Cancer (HCC)    skin   Central cord syndrome at C6 level of cervical spinal cord (HCC) 11/29/2017   Hypertension    Protein-calorie malnutrition, severe (HCC) 01/24/2018   S/P insertion of IVC (inferior vena caval) filter 01/24/2018   Tetraparesis (HCC)    Past Surgical History:  Procedure Laterality Date   ANTERIOR CERVICAL DECOMP/DISCECTOMY FUSION N/A 11/29/2017   Procedure: Cervical five-six, six-seven Anterior Cervical Decompression Fusion;  Surgeon: Jadene Pierini, MD;  Location: [facility] OR;  Service: Neurosurgery;  Laterality: N/A;  Cervical five-six, six-seven Anterior Cervical Decompression Fusion   CATARACT EXTRACTION     FEMUR IM NAIL Left 08/16/2021   Procedure: INTRAMEDULLARY (IM) NAIL FEMORAL;  Surgeon: Deeann Saint, MD;  Location: [facility] ORS;  Service: Orthopedics;  Laterality: Left;   IR IVC FILTER PLMT / S&I /IMG GUID/MOD SED  12/08/2017   Patient Active Problem List   Diagnosis Date Noted   Age-related osteoporosis without current pathological fracture 07/27/2022   Mild recurrent major depression (HCC) 07/27/2022   Hypertensive heart disease with other congestive heart failure (HCC) 02/14/2022   Chronic indwelling Foley catheter 02/14/2022   Closed hip fracture (HCC)  08/15/2021   DVT (deep venous thrombosis) (HCC) 08/15/2021   Quadriplegia (HCC) 08/15/2021   Fall 08/15/2021   Constipation due to slow transit 08/31/2018   Trauma 06/05/2018   Neuropathic pain 06/05/2018   Neurogenic bowel 06/05/2018   Vaginal yeast infection 01/30/2018   Healthcare-associated pneumonia 01/25/2018   Chronic allergic rhinitis 01/24/2018   Depression with anxiety 01/24/2018   UTI due to Klebsiella species 01/24/2018   Protein-calorie malnutrition, severe (HCC) 01/24/2018   S/P insertion of IVC (inferior vena caval) filter 01/24/2018   Tetraparesis (HCC) 01/20/2018   Neurogenic bladder 01/20/2018   Reactive depression    Benign essential HTN    Acute postoperative anemia due to expected blood loss    Central cord syndrome at C6 level of cervical spinal cord (HCC) 11/29/2017   Allergy to alpha-gal 11/25/2016   SCC (squamous cell carcinoma) 04/08/2014    ONSET DATE: 11/29/2017  REFERRING DIAG: Central Cord Syndrome at C6 of the Cervical Spinal Cord, Fall with Bilateral Closed Hip Fractures, with ORIF repair with Intramedullary Nailing of the Left Hip Fracture.   THERAPY DIAG:  Muscle weakness (generalized)  Other lack of coordination  Rationale for Evaluation and Treatment: Rehabilitation  SUBJECTIVE:   SUBJECTIVE STATEMENT:  Pt. reports doing well today.    Pt accompanied by: self, Personal Care aide  PERTINENT HISTORY: Central Cord Syndrome at C6 of the Cervical Spinal Cord, Fall with Bilateral Closed Hip Fractures, with ORIF repair with Intramedullary  Nailing of the Left Hip Fracture.   PRECAUTIONS: None  WEIGHT BEARING RESTRICTIONS: No  PAIN:  Are you having pain? No   LIVING ENVIRONMENT: Lives with: [facility]  Stairs: No Has following equipment at home: Wheelchair (power)  PLOF: Independent   PATIENT GOALS:  See below for established goals  OBJECTIVE:  Note: Objective measures were completed at Evaluation unless  otherwise noted.  HAND DOMINANCE: Right  ADLs: Caregiver assist with ADLs, and [facility]  MOBILITY STATUS: Uses a power w/c  ACTIVITY TOLERANCE: Activity tolerance: Fair  FUNCTIONAL OUTCOME MEASURES:  Measurements:   12/09/2021:   Shoulder flexion: R: 120(136), L: 138(150) Shoulder abduction: R: 108(120), L: 110(120) Elbow: R: 0-148, L: 0-146 Wrist flexion: R: 55(62), L: 78 Writ extension: R: 34(55), L: 3(4) RD: R: 12(20), L: 16(26) UD: R: 8(24), L: 6(18) Thumb radial abduction: R: 11(20), L: 8(20) Digit flexion to the [facility]: R:  2nd: 5cm(3.5cm), 3rd: 7.5 cm(5cm), 4th: 7cm(3cm), 5th: 6cm(5.5cm) L: 2nd: 8cm(8cm), 3rd: 8cm(8cm), 4th: 7.5cm(7cm), 5th: 7cm(7cm)   02/03/2022:   Shoulder flexion: R: 122(138), L: 148(154) Shoulder abduction: R: 109(125), L: 125(125 Elbow: R: 0-148, L: 0-146 Wrist flexion: R: 60(68), L: 79 Writ extension: R: 40(55), L: 3(10) RD: R: 14(24), L: 20(28) UD: R: 8(24), L: 6(18) Thumb radial abduction: R: 20(25), L: 8(20) Digit flexion to the [facility]: R:  2nd: 4.5cm(3cm), 3rd: 6.5cm(4.5cm), 4th: 6.5 cm(3cm), 5th: 4.5cm(5cm) L: 2nd: 8cm(8cm), 3rd: 8cm(7cm), 4th: 7.5cm(7cm), 5th: 6.5cm(6.5cm)   04/07/2022:   Shoulder flexion: R: 125(150), L: 160(160) Shoulder abduction: R: 109(125), L: 125(125 Elbow: R: 0-148, L: 0-146 Wrist flexion: R: 60(68), L: 79 Writ extension: R: 40(55), L: 5(18) RD: R: 14(24), L: 20(28) UD: R: 18(24), L: 8(18) Thumb radial abduction: R: 20(25), L: 8(20) Digit flexion to the [facility]: R:  2nd: 4.5 cm(4 cm), 3rd: 6.5cm(3cm), 4th: 7 cm (5cm), 5th: 5cm(5cm) L: 2nd: 8cm(8cm), 3rd: 8cm(7cm), 4th: 7.5cm(7cm), 5th: 7cm(7cm)   07/05/2022:   Shoulder flexion: R: 125(154), L: 160(160) Shoulder abduction: R: 95(130), L: 143(143) Elbow: R: 0-148, L: 0-146 Wrist flexion: R: 60(68), L: 79 Writ extension: R: 40(60), L: 0(12) RD: R: 10(24), L: 12(20) UD: R: 16(24), L: 8(16) Thumb radial abduction: R: 20(25), L: 8(20) Digit flexion to  the [facility]: R:  2nd: 4 cm(4cm), 3rd: 6.5cm( 3.5cm), 4th: 7 cm (5cm), 5th: 6cm(5cm) L: 2nd: 8cm(8cm), 3rd: 8cm(7cm), 4th: 8cm(7cm), 5th: 7cm(7cm)   09/13/2022:   Shoulder flexion: R: 125(154), L: 160(160) Shoulder abduction: R: 100(130), L: 143(143) Elbow: R: 0-150, L: 0-150 Wrist flexion: R: 55(68), L: 79 Writ extension: R: 45(60), L: 0(12) RD: R: 10(24), L: 12(20) UD: R: 16(24), L: 8(16) Thumb radial abduction: R: 20(25), L: 8(20) Digit flexion to the [facility]: R:  2nd: 4 cm(4cm), 3rd: 6cm( 5cm), 4th: 6 cm (4cm), 5th: 5cm(5cm) L: 2nd: 7cm(cm), 3rd: 7cm(6cm), 4th: 7cm(6cm), 5th: 6cm(6cm)   10/27/2022   Shoulder flexion: R: 128(154), L: 160(160) Shoulder abduction: R: 105(130), L: 143(143) Elbow: R: 0-150, L: 0-150 Wrist flexion: R: 55(68), L: 79 Writ extension: R: 46(64), L: 0(20) RD: R: 10(20), L: 18(24) UD: R: 118(28), L: 8(20) Thumb radial abduction: R: 30(42), L:20(24) Digit flexion to the [facility]: R:  2nd: 4 cm(3cm), 3rd: 6cm( 6cm), 4th: 6 cm (5cm), 5th: 5cm(5cm) L: 2nd: 8cm(8cm), 3rd: 8cm(6cm), 4th: 8cm(7cm), 5th: 6cm(6cm)      COORDINATION:  Impaired 2/2 bilateral hand digit MP, PIP, and DIP extension tightness  SENSATION: Intact  EDEMA: N/A   COGNITION: Overall  cognitive status: WNL   VISION: Subjective report: Wears glasses, No changes in vision.   PERCEPTION: Intact  TODAY'S TREATMENT:                                                                                                                              DATE: 12/15/2022   Manual Therapy:   Pt. tolerated soft tissue massage to the volar, and dorsal surface of each digit on the bilateral hands, digit lateral, and sagittal bands 2/2 to stiffness following moist heat modality. Soft tissue mobilization was performed for carpal, and metacarpal spread stretches. Manual therapy was performed independent of, and in preparation for therapeutic Ex.     Therapeutic Ex.    Pt. tolerated PROM followed by AROM bilateral  wrist extension, PROM for bilateral digit MP, PIP, and DIP flexion, and extension, thumb radial, and palmar abduction. Pt. performed 3# dowel ex. for UE strengthening secondary to weakness. Bilateral shoulder flexion, chest press, circular patterns, and elbow flexion/extension were performed.  Pt. worked on Marketing executive for using a lateral pinch, and 3pt. pinch grasps using yellow, and red resistive clips..Pt. Worked on BUE strengthening, and reciprocal motion using the UBE while seated for 6 min. with no resistance. Constant monitoring was provided.   PATIENT EDUCATION: Education details: Bilateral hand function, ROM, and strengthening Person educated: Patient Education method: Explanation, Demonstration, and Verbal cues Education comprehension: verbalized understanding and returned demonstration  HOME EXERCISE PROGRAM: Continue to assess HEP needs, and provide as needed.    GOALS: Goals reviewed with patient? Yes    LONG TERM GOALS: Target date: 01/19/2023  3.  Patient will demonstrate improved composite finger flexion to be able to firmly hold and use adaptive devices during ADLs, and IADLs.  Baseline: 12/06/2022: Pt. continues to present with increased MP, PIP, and DIP digit tightness/stiffness limiting the formulation of bilateral composites fists. 10/27/2022: Pt. presents with increased MP, PIP, and DIP digit tightness/stiffness limiting the formulation of bilateral composites fists during this progress reporting period limiting. Digit flexion to the [facility]: R:  2nd: 4 cm(3cm), 3rd: 6cm( 6cm), 4th: 6 cm (5cm), 5th: 5cm(5cm), L: 2nd: 8cm(8cm), 3rd: 8cm(6cm), 4th: 8cm(7cm), 5th: 6cm(6cm) 09/20/2022: Improving digit flexion to the [facility]. 09/13/22: improved digit flexion to the [facility]. 07/05/2022: Pt. presents with digit MP, PIP, and DIP extensor tightness limiting her ability to securely grip objects in her bilateral hands.  04/07/2022: Pt. Has improved with right 2nd, and 3rd digit flexion towards  the [facility]. Pt. Continues to have difficulty securely holding and applying deodorant.02/03/2022: Pt. Has improved with digit flexion, however, continues to have difficulty securely holding and using deodorant. 12/09/2021: Bilateral hand/digit MP, PIP, and DIP extension tightness limits her ability achieve digit flexion to hold, and apply deodorant. 10/28/2021:  Pt. continues to have difficulty holding the deodorant. 01/12/2021: Pt. Presents with limited digit extension. Pt. Is able to initiate holding deodorant, however is unable to hold it while using it  Goal status:  Ongoing   4.  Pt. Will improve bilateral wrist extension in preparation for anticipating, and initiating reaching for objects at the table.  Baseline: 12/06/2022: Pt continues to to progress with bilateral wrist extension in preparation fro functional reaching. 10/27/2022: Pt. Is improving with bilateral wrist extension. R: 46(64) L: 0(20) 09/20/2022: limited PROM in the Left wrist extension 2/2 tightness/stiffness. 09/13/22: R: 55(68), L: 0(12) 07/19/2022: Bilateral wrist extension in limited. 07/05/2022: Right: 40(60) Left: 0(12) 04/07/2022: Right: 40(55) Left: 5(18) 02/03/2022: Right: 34(55) Left: 3(4) 12/09/2021: Right  34(55)  Left  3(4) 10/28/2021:  Right 22(38), Left 0(15) 09/14/2021:  Right: 17(35), left 2(15)  Goal status: Ongoing  7.  Pt. Will increase bilateral lateral pinch strength by 2 lbs to be able to securely grasp items during ADLs, and IADL tasks.  Baseline: 12/06/2022: Pt. Is able to more securely hold objects during ADLs/IADLs 10/27/2022: Pt. Is improving with holding items with her bilateral thumbs. (Pinch meter out for calibration) 09/20/2022: lateral pinch continues to be limited 09/13/22: R 6.5# L 4# 07/19/2022: TBD 07/05/2022: TBD 07/2022: NT-Pinch meter out for calibration. 02/03/2022: Right: 6#, Left: 4# 12/09/2021: Right: 6#, Left: 4#  Goal status: Ongoing   9.  Pt. will complete plant care with modified independence.   Baseline:12/06/2022: Pt. Is independent with plant care in her husband's room, however is not able to access her plants due to furniture blocking access to them. 10/27/2022: Pt. Is able to water plants that are closer, and within reach. Pt. continues to have difficulty reaching for thorough plant care. 09/20/2022: Pt. Continues to have difficulty reaching for thorough plant care. 09/13/22: continues to report intermittent difficulty 07/19/2022: Pt. Continues to be able to water, and care for some of her plants. Pt. Has more difficulty with plants that are harder to reach. 07/05/2022: pt. Is able to water, and care for some of her plants. Pt. Has more difficulty with plants that are harder to reach. 04/07/2022: Pt. is now able to hold a cup and water her plants.02/03/2022: Pt. has difficulty caring for her plants.  Goal status:Achieved   10.  Pt. will demonstrate adaptive techniques to assist with the efficiency of self-dressing, or morning care tasks.  Baseline: 12/06/2022: Pt. requires assist from staff, as Pt. Reports staff have decreased time. 10/27/2022: Pt. is able to assist with initiating UE dressing. Pt. requires assist from personal, and staff care aides. 09/20/2022: Pt. continues to require assist form personal, and staff care aides. 09/13/22: MODA dressing, reports she is often rushed 07/19/2022: Pt. continues to require assist with self-dressing/morning care tasks. 07/05/2022: Continue with goal. 04/07/2022: Pt. Continues to require assist with the efficiency of self-dressing, and morning care tasks.02/03/2022: Pt. requires assist from caregivers 2/2 time limitations during morning care.  Goal status: Ongoing  11.  Pt. will improve BUE strength to improve ADL, and IADL functioning.  Baseline: 12/06/2022: Continue 10/27/2022: shoulder flexion L 4/5, R 3+/5; shoulder abduction L 4+/5, R 3+/5. elbow flexion B 5/5, elbow extension 4/5 09/20/2022: Pt. Presents with limited BUE strength 09/13/22: shoulder flexion L  4/5, R 3/5; elbow flexion B 5/5, elbow extension 4-/5; shoulder abduction L 4+/5, R 3+/5. 07/19/2022: BUE strength continues to be limited. 07/05/2022: shoulder flexion: right 4-/5, abduction: 3+/5, elbow flexion: right: 5/5, left 5/5, extension: right: 4-/5, left 4-/5, wrist extension: right: 3-/5, left: 2-/5  Goal status: Ongoing  12.  Pt. will improve bilateral thumb radial abduction in order to be able to hold the grab bars while standing with PT  Baseline:  12/06/2022: Pt. Continues to present  with limited thumb abduction, however is improving holding onto the parallel bars.10/27/2022: thumb radial abduction: R: 30(42), L: 20(24  Goal status: Ongoing  13.  Pt. Will be able to securely hold, and stabilize medication bottles at a tabletop surface when opening, and closing them.  Baseline: 12/06/2022: Pt is improving with securely stabilizing medication bottles. 10/27/2022: Pt. Stabilizes bottles against her torso when attempting to open, and close them   Goal status:Ongoing    ASSESSMENT:  CLINICAL IMPRESSION:  Pt. continues to make progress, and is now able to use bilateral hands to fold her calendar in 4 quarters, and securely place it into a w/c  holder.  Pt. reports no pain today. Pt. is tolerating the exercises well, and was able to tolerate  increased dowel weight to 3#.  Pt. required assist for hand position, and placement of clips into her hand. Pt. continues to present with tightness at the bilateral thumb webspace, bilateral wrist flexor tightness, as well as digit MP, PIP, and DIP extensor tightness which continues to limit her ability to complete ADL tasks efficiently. Pt. continues to benefit from working on impoving ROM, and UE strength in order to work towards increasing bilateral hand grasp on objects, and increasing engagement of bilateral hands during ADLs, and IADL tasks.    PERFORMANCE DEFICITS: in functional skills including ADLs, IADLs, coordination, dexterity, ROM, strength,  and UE functional use, cognitive skills including , and psychosocial skills including coping strategies and environmental adaptation.   IMPAIRMENTS: are limiting patient from ADLs, IADLs, and leisure.   CO-MORBIDITIES: may have co-morbidities  that affects occupational performance. Patient will benefit from skilled OT to address above impairments and improve overall function.  MODIFICATION OR ASSISTANCE TO COMPLETE EVALUATION: Min-Moderate modification of tasks or assist with assess necessary to complete an evaluation.  OT OCCUPATIONAL PROFILE AND HISTORY: Detailed assessment: Review of records and additional review of physical, cognitive, psychosocial history related to current functional performance.  CLINICAL DECISION MAKING: Moderate - several treatment options, min-mod task modification necessary  REHAB POTENTIAL: Good for stated goals    PLAN:  OT FREQUENCY 2x's a week  OT DURATION: 12 weeks  PLANNED INTERVENTIONS ADL training, A/E training, UE ther. Ex, Manual therapy, neuromuscular re-education, moist heat modality, Paraffin Bath, Splinting, and  Pt./caregiver education   RECOMMENDED OTHER SERVICES: PT  CONSULTED AND AGREED WITH PLAN OF CARE: Patient  PLAN FOR NEXT SESSION: Continue Treatment as per established POC   Olegario Messier, MS, OTR/L 12/20/2022, 2:16 PM

## 2022-12-20 NOTE — Therapy (Signed)
OUTPATIENT PHYSICAL THERAPY NEURO TREATMENT    Patient Name: Sherry [name] MRN: [id number] DOB:Feb 11, 1934, 88 y.o., female Today's Date: 12/20/2022   PCP: Earnestine Mealing, MD REFERRING PROVIDER: Deeann Saint   PT End of Session - 12/20/22 1124     Visit Number 86    Number of Visits 92    Date for PT Re-Evaluation 12/29/22    Authorization Type a[employer]na medicare FOTO performed by PT on eval (7/20), score 12, Progress note on 03/10/2020; PN on 08/20/2020    Authorization Time Period Initial cert 6/57/8469-62/95/2841; Recert 01/11/2022-04/05/2022; Recert 04/07/2022-06/30/2022    Progress Note Due on Visit 90    PT Start Time 1145    PT Stop Time 1220    PT Time Calculation (min) 35 min    Equipment Utilized During Treatment Gait belt    Activity Tolerance Patient tolerated treatment well;No increased pain;Patient limited by fatigue    Behavior During Therapy [facility] for tasks assessed/performed                     Past Medical History:  Diagnosis Date   Acute blood loss anemia    Arthritis    Cancer (HCC)    skin   Central cord syndrome at C6 level of cervical spinal cord (HCC) 11/29/2017   Hypertension    Protein-calorie malnutrition, severe (HCC) 01/24/2018   S/P insertion of IVC (inferior vena caval) filter 01/24/2018   Tetraparesis (HCC)    Past Surgical History:  Procedure Laterality Date   ANTERIOR CERVICAL DECOMP/DISCECTOMY FUSION N/A 11/29/2017   Procedure: Cervical five-six, six-seven Anterior Cervical Decompression Fusion;  Surgeon: Jadene Pierini, MD;  Location: [facility] OR;  Service: Neurosurgery;  Laterality: N/A;  Cervical five-six, six-seven Anterior Cervical Decompression Fusion   CATARACT EXTRACTION     FEMUR IM NAIL Left 08/16/2021   Procedure: INTRAMEDULLARY (IM) NAIL FEMORAL;  Surgeon: Deeann Saint, MD;  Location: [facility] ORS;  Service: Orthopedics;  Laterality: Left;   IR IVC FILTER PLMT / S&I /IMG GUID/MOD SED  12/08/2017   Patient Active Problem  List   Diagnosis Date Noted   Age-related osteoporosis without current pathological fracture 07/27/2022   Mild recurrent major depression (HCC) 07/27/2022   Hypertensive heart disease with other congestive heart failure (HCC) 02/14/2022   Chronic indwelling Foley catheter 02/14/2022   Closed hip fracture (HCC) 08/15/2021   DVT (deep venous thrombosis) (HCC) 08/15/2021   Quadriplegia (HCC) 08/15/2021   Fall 08/15/2021   Constipation due to slow transit 08/31/2018   Trauma 06/05/2018   Neuropathic pain 06/05/2018   Neurogenic bowel 06/05/2018   Vaginal yeast infection 01/30/2018   Healthcare-associated pneumonia 01/25/2018   Chronic allergic rhinitis 01/24/2018   Depression with anxiety 01/24/2018   UTI due to Klebsiella species 01/24/2018   Protein-calorie malnutrition, severe (HCC) 01/24/2018   S/P insertion of IVC (inferior vena caval) filter 01/24/2018   Tetraparesis (HCC) 01/20/2018   Neurogenic bladder 01/20/2018   Reactive depression    Benign essential HTN    Acute postoperative anemia due to expected blood loss    Central cord syndrome at C6 level of cervical spinal cord (HCC) 11/29/2017   Allergy to alpha-gal 11/25/2016   SCC (squamous cell carcinoma) 04/08/2014    ONSET DATE: 08/15/2021 (fall with B hip fx); Initial injury was in 2019- quadraplegia due to central cord syndrome of C6 secondary to neck fracture.   REFERRING DIAG: Bilateral Hip fx; Left Open- s/p Left IM nail femoral on 08/16/2021; Right closed  THERAPY DIAG:  Muscle weakness (generalized)  Other abnormalities of gait and mobility  Difficulty in walking, not elsewhere classified  Central cord syndrome, sequela (HCC)  Closed fracture of both hips, initial encounter [facility])  Rationale for Evaluation and Treatment Rehabilitation  SUBJECTIVE:   SUBJECTIVE STATEMENT:  Patient reports feeling some better and agreeable to try to stand today.     Pt accompanied by:  Paid caregiver-   PERTINENT  HISTORY: Sherry [name] is an 87yoF who experienced a fall at home on 08/15/2021 with hip fracture, s/p Left hip ORIF. PMH: quadriplegia due to central cord syndrome of C6 secondary to neck fracture, HTN, depression with anxiety, neurogenic bladder, bilateral DVT on Xarelto (s/p of IVC filter placement). Prior history of significant PT/OT with improvement of function. Pt has full use of shoulder/elbows, limited use of hands, adaptive self feeding sucessful.     PAIN:  None current but does endorse some left thigh pain intermittent  OBJECTIVE:    TODAY'S TREATMENT:  12/15/2022     Therapeutic exercises: Active Assisted Seated marches 2 x 15 reps each LE Seated LAQ 2x15 each LE AAROM on right and AROM on Left Seated AAROM knee extension 10 reps LLE Seated manual resistive Hip add/ABD x 15 reps with 5 sec hold each Seated assisted heel slide x 15 reps each LE    Therapeutic activities:    3 rounds of static standing in // bars - all between 1-2 min- On 2nd trial - performed some dynamic weight shifting- able to perform 20 reps and no right knee buckling but limited by fatigue in all stands today.                PATIENT EDUCATION: Education details: cues for isolated joint exercises  HOME EXERCISE PROGRAM: No changes at this time  Access Code: [facility] URL: [email or web address] Date: 11/23/2021 Prepared by: Precious Bard Exercises - Seated Gluteal Sets - 2 x daily - 7 x weekly - 2 sets - 10 reps - 5 hold - Seated Quad Set - 2 x daily - 7 x weekly - 2 sets - 10 reps - 5 hold - Seated Long Arc Quad - 2 x daily - 7 x weekly - 2 sets - 10 reps - 5 hold - Seated March - 2 x daily - 7 x weekly - 2 sets - 10 reps - 5 hold - Seated Hip Abduction - 2 x daily - 7 x weekly - 2 sets - 10 reps - 5 hold - Seated Shoulder Shrugs - 2 x daily - 7 x weekly - 2 sets - 10 reps - 5 hold - Wheelchair Pressure Relief - 2 x daily - 7 x weekly - 2 sets - 10 reps - 5 hold  GOALS: Goals  reviewed with patient? Yes  SHORT TERM GOALS: Target date: 02/22/2022  Pt will be independent with initial UE strengthening HEP in order to improve strength and balance in order to decrease fall risk and improve function at home and work. Baseline: 10/19/2021- patient with no formal UE HEP; 12/02/2021= Patient verbalized knowledge of HEP including use of theraband for UE strengthening.  Goal status: GOAL MET  LONG TERM GOALS: Target date: 12/29/2022  Pt will be independent with final for UE/LE HEP in order to improve strength and balance in order to decrease fall risk and improve function at home and work. Baseline: Patient is currently BLE NWB and unable to participate in HEP. Has order for UE strengthening. 01/11/2022- Patient now able to participate in LE  strengthening although NWB still- good understanding for some basic exercises. Will keep goal active to incorporate progressive LE strengthening exercises. 04/07/2022= Patient still participating in progressive LE seated HEP and has no questions at this time.  Goal status: MET  2.  Pt will improve FOTO to target score of 35  to display perceived improvements in ability to complete ADL's.  Baseline: 10/19/2021= 12; 12/02/2021= 17; 01/11/2022=17; 04/07/2022= 17; 07/05/2022=17; 10/06/2022- outcome measure not appropriate as patient in non-ambulatory and not consistently standing. Goal status: Goal not appropriate  3.  Pt will increase strength of B UE  by at least 1/2 MMT grade in order to demonstrate improvement in strength and function  Baseline: patient has range of 2-/5 to 4/5 BUE Strength; 12/02/2021= 4/5 except with wrist ext. 01/11/2022= 4/5 B UE strength expept for wrist Goal status: Goal revised-no longer appropriate- working with OT on all UE strengthening.   4. Pt. Will increase strength of RLE by at least 1/2 MMT grade in order to demonstrate improvement in Standing/transfers.  Baseline: 2-/5 Right hip flex/knee ext/flex; 04/07/2022= 2- with  right hip flex/knee ext (lacking 28 deg from zero) 4/8= Patient able to ext right knee lacking 18 deg from zero. 07/05/2022=Left knee approx 8 deg from zero and right knee = 23 deg from zero  Goal status: Progressing  5. Pt. Will demo ability to stand pivot transfer with max assist for improved functional mobility and less dependent need on mechanical device.   Baseline: Dependent on hoyer lift for all transfers; 04/07/2022- Unable to test secondary to patient with recent UTI and right hand procedure - will attempt to reassess next visit. ; 04/12/22: Unable to successfully stand due to feet plates on loaned power w/c; 05/10/2022- Patient still having to use rental chair and has not received her original power w/c back to practice SPT. 07/04/2021= Patient just received new power w/c and attempted standing from 1st time today- Patient able to stand with max assist today from w/c and did not attempt to perform pivot transfer- difficulty with static standing and placing weight on right LE. 10/06/2022- Not assessed today- patient needs new AFO prior to attempting transfers  Goal status: ONGOING  6. Pt. Will demonstrate improved functional LE strength as seen by ability to stand > 2 min for improved transfer ability and pregait abilities.   Baseline: not assessed today due to recent dx: UTI; 04/07/2022- Unable to test secondary to patient with recent UTI and right hand procedure - will attempt to reassess next visit; 04/12/22: Unable to successfully stand due to feet plates on loaned power w/c 05/10/2022- Patient still having to use rental chair and has not received her original power w/c back to practice Standing. 07/05/2022- Patient able to stand with max assist at trunk today for 2 min 3 sec. Will keep goal active to ensure consistency. 10/06/2022= Unable to assess today- patient with some recent blood pressure issues and needs new AFO. 11/24/2022= Patient demonstrated multiple rounds of standing in // bars- holding up to 1:45 min  today but has been able to exhibit 2 min in recent past- working on consistency.   Goal status: ONGOING  ASSESSMENT:  CLINICAL IMPRESSION: Patient able to improve with overall standing- able to lateral weight shift well today without loss of balance. She continued to focus on erect standing and states most limited by fatigue.   Pt will continue to benefit from skilled PT services to optimize independence and reduced caregiver support.    OBJECTIVE IMPAIRMENTS Abnormal gait,  decreased activity tolerance, decreased balance, decreased coordination, decreased endurance, decreased mobility, difficulty walking, decreased ROM, decreased strength, hypomobility, impaired flexibility, impaired UE functional use, postural dysfunction, and pain.   ACTIVITY LIMITATIONS carrying, lifting, bending, sitting, standing, squatting, sleeping, stairs, transfers, bed mobility, continence, bathing, toileting, dressing, self feeding, reach over head, hygiene/grooming, and caring for others  PARTICIPATION LIMITATIONS: meal prep, cleaning, laundry, medication management, personal finances, interpersonal relationship, driving, shopping, community activity, and yard work  PERSONAL FACTORS Age, Time since onset of injury/illness/exacerbation, and 1-2 comorbidities: HTN, cervical Sx  are also affecting patient's functional outcome.   REHAB POTENTIAL: Good  CLINICAL DECISION MAKING: Evolving/moderate complexity  EVALUATION COMPLEXITY: Moderate  PLAN: PT FREQUENCY: 1-2x/week  PT DURATION: 12 weeks  PLANNED INTERVENTIONS: Therapeutic exercises, Therapeutic activity, Neuromuscular re-education, Balance training, Gait training, Patient/Family education, Self Care, Joint mobilization, DME instructions, Dry Needling, Electrical stimulation, Wheelchair mobility training, Spinal mobilization, Cryotherapy, Moist heat, and Manual therapy  PLAN FOR NEXT SESSION:  strength, core training, Sit to stand and static standing;  progress to SPT as able.     2:58 PM, 12/20/22 Louis Meckel, PT Physical Therapist - [facility]  Outpatient Physical Therapy- Main Campus [phone]

## 2022-12-22 ENCOUNTER — Ambulatory Visit: Payer: Medicare HMO | Admitting: Occupational Therapy

## 2022-12-22 ENCOUNTER — Ambulatory Visit: Payer: Medicare HMO

## 2022-12-27 ENCOUNTER — Ambulatory Visit: Payer: Medicare HMO

## 2022-12-27 ENCOUNTER — Ambulatory Visit: Payer: Medicare HMO | Admitting: Occupational Therapy

## 2022-12-27 DIAGNOSIS — M6281 Muscle weakness (generalized): Secondary | ICD-10-CM | POA: Diagnosis not present

## 2022-12-27 DIAGNOSIS — R262 Difficulty in walking, not elsewhere classified: Secondary | ICD-10-CM

## 2022-12-27 DIAGNOSIS — R2689 Other abnormalities of gait and mobility: Secondary | ICD-10-CM

## 2022-12-27 DIAGNOSIS — S14129S Central cord syndrome at unspecified level of cervical spinal cord, sequela: Secondary | ICD-10-CM

## 2022-12-27 DIAGNOSIS — R269 Unspecified abnormalities of gait and mobility: Secondary | ICD-10-CM

## 2022-12-27 DIAGNOSIS — S72001A Fracture of unspecified part of neck of right femur, initial encounter for closed fracture: Secondary | ICD-10-CM

## 2022-12-27 NOTE — Therapy (Signed)
OUTPATIENT PHYSICAL THERAPY NEURO TREATMENT    Patient Name: Sherry [name] MRN: [id number] DOB:[birth year], 88 y.o., female Today's Date: 12/27/2022   PCP: Earnestine Mealing, MD REFERRING PROVIDER: Deeann Saint   PT End of Session - 12/27/22 1110     Visit Number 87    Number of Visits 92    Date for PT Re-Evaluation 12/29/22    Authorization Type a[employer]na medicare FOTO performed by PT on eval (7/20), score 12, Progress note on 03/10/2020; PN on 08/20/2020    Authorization Time Period Initial cert 3/32/9518-84/16/6063; Recert 01/11/2022-04/05/2022; Recert 04/07/2022-06/30/2022    Progress Note Due on Visit 90    PT Start Time 1145    PT Stop Time 1220    PT Time Calculation (min) 35 min    Equipment Utilized During Treatment Gait belt    Activity Tolerance Patient tolerated treatment well;No increased pain;Patient limited by fatigue    Behavior During Therapy [facility] for tasks assessed/performed                      Past Medical History:  Diagnosis Date   Acute blood loss anemia    Arthritis    Cancer (HCC)    skin   Central cord syndrome at C6 level of cervical spinal cord (HCC) 11/29/2017   Hypertension    Protein-calorie malnutrition, severe (HCC) 01/24/2018   S/P insertion of IVC (inferior vena caval) filter 01/24/2018   Tetraparesis (HCC)    Past Surgical History:  Procedure Laterality Date   ANTERIOR CERVICAL DECOMP/DISCECTOMY FUSION N/A 11/29/2017   Procedure: Cervical five-six, six-seven Anterior Cervical Decompression Fusion;  Surgeon: Jadene Pierini, MD;  Location: [facility] OR;  Service: Neurosurgery;  Laterality: N/A;  Cervical five-six, six-seven Anterior Cervical Decompression Fusion   CATARACT EXTRACTION     FEMUR IM NAIL Left 08/16/2021   Procedure: INTRAMEDULLARY (IM) NAIL FEMORAL;  Surgeon: Deeann Saint, MD;  Location: [facility] ORS;  Service: Orthopedics;  Laterality: Left;   IR IVC FILTER PLMT / S&I /IMG GUID/MOD SED  12/08/2017   Patient Active  Problem List   Diagnosis Date Noted   Age-related osteoporosis without current pathological fracture 07/27/2022   Mild recurrent major depression (HCC) 07/27/2022   Hypertensive heart disease with other congestive heart failure (HCC) 02/14/2022   Chronic indwelling Foley catheter 02/14/2022   Closed hip fracture (HCC) 08/15/2021   DVT (deep venous thrombosis) (HCC) 08/15/2021   Quadriplegia (HCC) 08/15/2021   Fall 08/15/2021   Constipation due to slow transit 08/31/2018   Trauma 06/05/2018   Neuropathic pain 06/05/2018   Neurogenic bowel 06/05/2018   Vaginal yeast infection 01/30/2018   Healthcare-associated pneumonia 01/25/2018   Chronic allergic rhinitis 01/24/2018   Depression with anxiety 01/24/2018   UTI due to Klebsiella species 01/24/2018   Protein-calorie malnutrition, severe (HCC) 01/24/2018   S/P insertion of IVC (inferior vena caval) filter 01/24/2018   Tetraparesis (HCC) 01/20/2018   Neurogenic bladder 01/20/2018   Reactive depression    Benign essential HTN    Acute postoperative anemia due to expected blood loss    Central cord syndrome at C6 level of cervical spinal cord (HCC) 11/29/2017   Allergy to alpha-gal 11/25/2016   SCC (squamous cell carcinoma) 04/08/2014    ONSET DATE: 08/15/2021 (fall with B hip fx); Initial injury was in 2019- quadraplegia due to central cord syndrome of C6 secondary to neck fracture.   REFERRING DIAG: Bilateral Hip fx; Left Open- s/p Left IM nail femoral on 08/16/2021; Right closed  THERAPY  DIAG:  Muscle weakness (generalized)  Other abnormalities of gait and mobility  Difficulty in walking, not elsewhere classified  Central cord syndrome, sequela (HCC)  Closed fracture of both hips, initial encounter (HCC)  Abnormality of gait and mobility  Rationale for Evaluation and Treatment Rehabilitation  SUBJECTIVE:   SUBJECTIVE STATEMENT:  Patient reports feeling okay but still having LE swelling and going to have a consult with  vein and vascular specialist tomorrow.      Pt accompanied by:  Paid caregiver-   PERTINENT HISTORY: Sherry [name] is an 87yoF who experienced a fall at home on 08/15/2021 with hip fracture, s/p Left hip ORIF. PMH: quadriplegia due to central cord syndrome of C6 secondary to neck fracture, HTN, depression with anxiety, neurogenic bladder, bilateral DVT on Xarelto (s/p of IVC filter placement). Prior history of significant PT/OT with improvement of function. Pt has full use of shoulder/elbows, limited use of hands, adaptive self feeding sucessful.     PAIN:  None current but does endorse some left thigh pain intermittent  OBJECTIVE:    TODAY'S TREATMENT:  12/27/2022     Therapeutic exercises: performed either in seated or reclined position.    -Active Assisted Seated marches-  2 x 15 reps each LE -Seated LAQ 2x15 each LE AROM with near full ROM on right  for 1st set and only able to perform 5 on second set and AROM on Left - 2x 15 Reclined ankle DF/PF  2 x 10 reps LLE Reclined quad sets with 5 sec hold each x 15 reps each LE Reclined Hip add/ABD AROM 2 x 15 reps  Reclined assisted heel slide 2x 15 reps each LE Reclined manual resistive leg press 2 x 10 reps each LE                 PATIENT EDUCATION: Education details: cues for isolated joint exercises  HOME EXERCISE PROGRAM: No changes at this time  Access Code: [facility] URL: [email or web address] Date: 11/23/2021 Prepared by: Precious Bard Exercises - Seated Gluteal Sets - 2 x daily - 7 x weekly - 2 sets - 10 reps - 5 hold - Seated Quad Set - 2 x daily - 7 x weekly - 2 sets - 10 reps - 5 hold - Seated Long Arc Quad - 2 x daily - 7 x weekly - 2 sets - 10 reps - 5 hold - Seated March - 2 x daily - 7 x weekly - 2 sets - 10 reps - 5 hold - Seated Hip Abduction - 2 x daily - 7 x weekly - 2 sets - 10 reps - 5 hold - Seated Shoulder Shrugs - 2 x daily - 7 x weekly - 2 sets - 10 reps - 5 hold - Wheelchair Pressure  Relief - 2 x daily - 7 x weekly - 2 sets - 10 reps - 5 hold  GOALS: Goals reviewed with patient? Yes  SHORT TERM GOALS: Target date: 02/22/2022  Pt will be independent with initial UE strengthening HEP in order to improve strength and balance in order to decrease fall risk and improve function at home and work. Baseline: 10/19/2021- patient with no formal UE HEP; 12/02/2021= Patient verbalized knowledge of HEP including use of theraband for UE strengthening.  Goal status: GOAL MET  LONG TERM GOALS: Target date: 12/29/2022  Pt will be independent with final for UE/LE HEP in order to improve strength and balance in order to decrease fall risk and improve function at home and work. Baseline:  Patient is currently BLE NWB and unable to participate in HEP. Has order for UE strengthening. 01/11/2022- Patient now able to participate in LE strengthening although NWB still- good understanding for some basic exercises. Will keep goal active to incorporate progressive LE strengthening exercises. 04/07/2022= Patient still participating in progressive LE seated HEP and has no questions at this time.  Goal status: MET  2.  Pt will improve FOTO to target score of 35  to display perceived improvements in ability to complete ADL's.  Baseline: 10/19/2021= 12; 12/02/2021= 17; 01/11/2022=17; 04/07/2022= 17; 07/05/2022=17; 10/06/2022- outcome measure not appropriate as patient in non-ambulatory and not consistently standing. Goal status: Goal not appropriate  3.  Pt will increase strength of B UE  by at least 1/2 MMT grade in order to demonstrate improvement in strength and function  Baseline: patient has range of 2-/5 to 4/5 BUE Strength; 12/02/2021= 4/5 except with wrist ext. 01/11/2022= 4/5 B UE strength expept for wrist Goal status: Goal revised-no longer appropriate- working with OT on all UE strengthening.   4. Pt. Will increase strength of RLE by at least 1/2 MMT grade in order to demonstrate improvement in  Standing/transfers.  Baseline: 2-/5 Right hip flex/knee ext/flex; 04/07/2022= 2- with right hip flex/knee ext (lacking 28 deg from zero) 4/8= Patient able to ext right knee lacking 18 deg from zero. 07/05/2022=Left knee approx 8 deg from zero and right knee = 23 deg from zero  Goal status: Progressing  5. Pt. Will demo ability to stand pivot transfer with max assist for improved functional mobility and less dependent need on mechanical device.   Baseline: Dependent on hoyer lift for all transfers; 04/07/2022- Unable to test secondary to patient with recent UTI and right hand procedure - will attempt to reassess next visit. ; 04/12/22: Unable to successfully stand due to feet plates on loaned power w/c; 05/10/2022- Patient still having to use rental chair and has not received her original power w/c back to practice SPT. 07/04/2021= Patient just received new power w/c and attempted standing from 1st time today- Patient able to stand with max assist today from w/c and did not attempt to perform pivot transfer- difficulty with static standing and placing weight on right LE. 10/06/2022- Not assessed today- patient needs new AFO prior to attempting transfers  Goal status: ONGOING  6. Pt. Will demonstrate improved functional LE strength as seen by ability to stand > 2 min for improved transfer ability and pregait abilities.   Baseline: not assessed today due to recent dx: UTI; 04/07/2022- Unable to test secondary to patient with recent UTI and right hand procedure - will attempt to reassess next visit; 04/12/22: Unable to successfully stand due to feet plates on loaned power w/c 05/10/2022- Patient still having to use rental chair and has not received her original power w/c back to practice Standing. 07/05/2022- Patient able to stand with max assist at trunk today for 2 min 3 sec. Will keep goal active to ensure consistency. 10/06/2022= Unable to assess today- patient with some recent blood pressure issues and needs new AFO.  11/24/2022= Patient demonstrated multiple rounds of standing in // bars- holding up to 1:45 min today but has been able to exhibit 2 min in recent past- working on consistency.   Goal status: ONGOING  ASSESSMENT:  CLINICAL IMPRESSION: Treatment continued to focus on LE to assist with standing and eventual transition to transfers. She responded well to all therex- able to achieve near full ROM with right knee ext yet  continues to fatigue and needs to continue working on improved overall strength to improve her standing/transfer ability.  Pt will continue to benefit from skilled PT services to optimize independence and reduced caregiver support.    OBJECTIVE IMPAIRMENTS Abnormal gait, decreased activity tolerance, decreased balance, decreased coordination, decreased endurance, decreased mobility, difficulty walking, decreased ROM, decreased strength, hypomobility, impaired flexibility, impaired UE functional use, postural dysfunction, and pain.   ACTIVITY LIMITATIONS carrying, lifting, bending, sitting, standing, squatting, sleeping, stairs, transfers, bed mobility, continence, bathing, toileting, dressing, self feeding, reach over head, hygiene/grooming, and caring for others  PARTICIPATION LIMITATIONS: meal prep, cleaning, laundry, medication management, personal finances, interpersonal relationship, driving, shopping, community activity, and yard work  PERSONAL FACTORS Age, Time since onset of injury/illness/exacerbation, and 1-2 comorbidities: HTN, cervical Sx  are also affecting patient's functional outcome.   REHAB POTENTIAL: Good  CLINICAL DECISION MAKING: Evolving/moderate complexity  EVALUATION COMPLEXITY: Moderate  PLAN: PT FREQUENCY: 1-2x/week  PT DURATION: 12 weeks  PLANNED INTERVENTIONS: Therapeutic exercises, Therapeutic activity, Neuromuscular re-education, Balance training, Gait training, Patient/Family education, Self Care, Joint mobilization, DME instructions, Dry Needling,  Electrical stimulation, Wheelchair mobility training, Spinal mobilization, Cryotherapy, Moist heat, and Manual therapy  PLAN FOR NEXT SESSION:  strength, core training, Sit to stand and static standing; progress to SPT as able. Plan for RECERT next visit.     3:27 PM, 12/27/22 Louis Meckel, PT Physical Therapist - [facility]  Outpatient Physical Therapy- Main Campus [phone]

## 2022-12-27 NOTE — Therapy (Signed)
Occupational Therapy Neuro Treatment Note   Patient Name: Sherry [name] MRN: [id number] DOB:1934/07/24, 88 y.o., female Today's Date: 11/29/2022  PCP:  Cindi Carbon PROVIDER: Earnestine Mealing, MD  END OF SESSION:   OT End of Session - 12/27/22 1217     Visit Number 94    Number of Visits 120    Date for OT Re-Evaluation 01/19/23    OT Start Time 1115    OT Stop Time 1145    OT Time Calculation (min) 30 min    Activity Tolerance Patient tolerated treatment well    Behavior During Therapy [facility] for tasks assessed/performed                 Past Medical History:  Diagnosis Date   Acute blood loss anemia    Arthritis    Cancer (HCC)    skin   Central cord syndrome at C6 level of cervical spinal cord (HCC) 11/29/2017   Hypertension    Protein-calorie malnutrition, severe (HCC) 01/24/2018   S/P insertion of IVC (inferior vena caval) filter 01/24/2018   Tetraparesis (HCC)    Past Surgical History:  Procedure Laterality Date   ANTERIOR CERVICAL DECOMP/DISCECTOMY FUSION N/A 11/29/2017   Procedure: Cervical five-six, six-seven Anterior Cervical Decompression Fusion;  Surgeon: Jadene Pierini, MD;  Location: [facility] OR;  Service: Neurosurgery;  Laterality: N/A;  Cervical five-six, six-seven Anterior Cervical Decompression Fusion   CATARACT EXTRACTION     FEMUR IM NAIL Left 08/16/2021   Procedure: INTRAMEDULLARY (IM) NAIL FEMORAL;  Surgeon: Deeann Saint, MD;  Location: [facility] ORS;  Service: Orthopedics;  Laterality: Left;   IR IVC FILTER PLMT / S&I /IMG GUID/MOD SED  12/08/2017   Patient Active Problem List   Diagnosis Date Noted   Age-related osteoporosis without current pathological fracture 07/27/2022   Mild recurrent major depression (HCC) 07/27/2022   Hypertensive heart disease with other congestive heart failure (HCC) 02/14/2022   Chronic indwelling Foley catheter 02/14/2022   Closed hip fracture (HCC) 08/15/2021   DVT (deep venous thrombosis) (HCC) 08/15/2021    Quadriplegia (HCC) 08/15/2021   Fall 08/15/2021   Constipation due to slow transit 08/31/2018   Trauma 06/05/2018   Neuropathic pain 06/05/2018   Neurogenic bowel 06/05/2018   Vaginal yeast infection 01/30/2018   Healthcare-associated pneumonia 01/25/2018   Chronic allergic rhinitis 01/24/2018   Depression with anxiety 01/24/2018   UTI due to Klebsiella species 01/24/2018   Protein-calorie malnutrition, severe (HCC) 01/24/2018   S/P insertion of IVC (inferior vena caval) filter 01/24/2018   Tetraparesis (HCC) 01/20/2018   Neurogenic bladder 01/20/2018   Reactive depression    Benign essential HTN    Acute postoperative anemia due to expected blood loss    Central cord syndrome at C6 level of cervical spinal cord (HCC) 11/29/2017   Allergy to alpha-gal 11/25/2016   SCC (squamous cell carcinoma) 04/08/2014    ONSET DATE: 11/29/2017  REFERRING DIAG: Central Cord Syndrome at C6 of the Cervical Spinal Cord, Fall with Bilateral Closed Hip Fractures, with ORIF repair with Intramedullary Nailing of the Left Hip Fracture.   THERAPY DIAG:  Muscle weakness (generalized)  Other lack of coordination  Rationale for Evaluation and Treatment: Rehabilitation  SUBJECTIVE:   SUBJECTIVE STATEMENT:  Pt. reports doing well today.   Pt accompanied by: self, Personal Care aide  PERTINENT HISTORY: Central Cord Syndrome at C6 of the Cervical Spinal Cord, Fall with Bilateral Closed Hip Fractures, with ORIF repair with Intramedullary Nailing of the Left Hip Fracture.   PRECAUTIONS: None  WEIGHT BEARING RESTRICTIONS: No  PAIN:  Are you having pain? No   LIVING ENVIRONMENT: Lives with: [facility]  Stairs: No Has following equipment at home: Wheelchair (power)  PLOF: Independent   PATIENT GOALS:  See below for established goals  OBJECTIVE:  Note: Objective measures were completed at Evaluation unless otherwise noted.  HAND DOMINANCE: Right  ADLs: Caregiver  assist with ADLs, and [facility]  MOBILITY STATUS: Uses a power w/c  ACTIVITY TOLERANCE: Activity tolerance: Fair  FUNCTIONAL OUTCOME MEASURES:  Measurements:   12/09/2021:   Shoulder flexion: R: 120(136), L: 138(150) Shoulder abduction: R: 108(120), L: 110(120) Elbow: R: 0-148, L: 0-146 Wrist flexion: R: 55(62), L: 78 Writ extension: R: 34(55), L: 3(4) RD: R: 12(20), L: 16(26) UD: R: 8(24), L: 6(18) Thumb radial abduction: R: 11(20), L: 8(20) Digit flexion to the [facility]: R:  2nd: 5cm(3.5cm), 3rd: 7.5 cm(5cm), 4th: 7cm(3cm), 5th: 6cm(5.5cm) L: 2nd: 8cm(8cm), 3rd: 8cm(8cm), 4th: 7.5cm(7cm), 5th: 7cm(7cm)   02/03/2022:   Shoulder flexion: R: 122(138), L: 148(154) Shoulder abduction: R: 109(125), L: 125(125 Elbow: R: 0-148, L: 0-146 Wrist flexion: R: 60(68), L: 79 Writ extension: R: 40(55), L: 3(10) RD: R: 14(24), L: 20(28) UD: R: 8(24), L: 6(18) Thumb radial abduction: R: 20(25), L: 8(20) Digit flexion to the [facility]: R:  2nd: 4.5cm(3cm), 3rd: 6.5cm(4.5cm), 4th: 6.5 cm(3cm), 5th: 4.5cm(5cm) L: 2nd: 8cm(8cm), 3rd: 8cm(7cm), 4th: 7.5cm(7cm), 5th: 6.5cm(6.5cm)   04/07/2022:   Shoulder flexion: R: 125(150), L: 160(160) Shoulder abduction: R: 109(125), L: 125(125 Elbow: R: 0-148, L: 0-146 Wrist flexion: R: 60(68), L: 79 Writ extension: R: 40(55), L: 5(18) RD: R: 14(24), L: 20(28) UD: R: 18(24), L: 8(18) Thumb radial abduction: R: 20(25), L: 8(20) Digit flexion to the [facility]: R:  2nd: 4.5 cm(4 cm), 3rd: 6.5cm(3cm), 4th: 7 cm (5cm), 5th: 5cm(5cm) L: 2nd: 8cm(8cm), 3rd: 8cm(7cm), 4th: 7.5cm(7cm), 5th: 7cm(7cm)   07/05/2022:   Shoulder flexion: R: 125(154), L: 160(160) Shoulder abduction: R: 95(130), L: 143(143) Elbow: R: 0-148, L: 0-146 Wrist flexion: R: 60(68), L: 79 Writ extension: R: 40(60), L: 0(12) RD: R: 10(24), L: 12(20) UD: R: 16(24), L: 8(16) Thumb radial abduction: R: 20(25), L: 8(20) Digit flexion to the [facility]: R:  2nd: 4 cm(4cm), 3rd: 6.5cm( 3.5cm), 4th: 7 cm  (5cm), 5th: 6cm(5cm) L: 2nd: 8cm(8cm), 3rd: 8cm(7cm), 4th: 8cm(7cm), 5th: 7cm(7cm)   09/13/2022:   Shoulder flexion: R: 125(154), L: 160(160) Shoulder abduction: R: 100(130), L: 143(143) Elbow: R: 0-150, L: 0-150 Wrist flexion: R: 55(68), L: 79 Writ extension: R: 45(60), L: 0(12) RD: R: 10(24), L: 12(20) UD: R: 16(24), L: 8(16) Thumb radial abduction: R: 20(25), L: 8(20) Digit flexion to the [facility]: R:  2nd: 4 cm(4cm), 3rd: 6cm( 5cm), 4th: 6 cm (4cm), 5th: 5cm(5cm) L: 2nd: 7cm(cm), 3rd: 7cm(6cm), 4th: 7cm(6cm), 5th: 6cm(6cm)   10/27/2022   Shoulder flexion: R: 128(154), L: 160(160) Shoulder abduction: R: 105(130), L: 143(143) Elbow: R: 0-150, L: 0-150 Wrist flexion: R: 55(68), L: 79 Writ extension: R: 46(64), L: 0(20) RD: R: 10(20), L: 18(24) UD: R: 118(28), L: 8(20) Thumb radial abduction: R: 30(42), L:20(24) Digit flexion to the [facility]: R:  2nd: 4 cm(3cm), 3rd: 6cm( 6cm), 4th: 6 cm (5cm), 5th: 5cm(5cm) L: 2nd: 8cm(8cm), 3rd: 8cm(6cm), 4th: 8cm(7cm), 5th: 6cm(6cm)      COORDINATION:  Impaired 2/2 bilateral hand digit MP, PIP, and DIP extension tightness  SENSATION: Intact  EDEMA: N/A   COGNITION: Overall cognitive status: WNL   VISION: Subjective report: Wears glasses, No  changes in vision.   PERCEPTION: Intact  TODAY'S TREATMENT:                                                                                                                              DATE: 12/27/2022   Manual Therapy:   Pt. tolerated soft tissue massage to the volar, and dorsal surface of each digit on the bilateral hands, digit lateral, and sagittal bands 2/2 to stiffness following moist heat modality. Soft tissue mobilization was performed for carpal, and metacarpal spread stretches. Manual therapy was performed independent of, and in preparation for therapeutic Ex.     Therapeutic Ex.    Pt. tolerated PROM followed by AROM bilateral wrist extension, PROM for bilateral digit MP, PIP, and DIP  flexion, and extension, thumb radial, and palmar abduction. Pt. performed 2.5# dowel ex. for UE strengthening secondary to weakness. Bilateral shoulder flexion, chest press, circular patterns, and elbow flexion/extension were performed.  Pt. worked on Marketing executive for using a lateral pinch, and 3pt. pinch grasps using yellow, and red resistive clips..Pt. Worked on BUE strengthening, and reciprocal motion using the UBE while seated for 6 min. with no resistance. Constant monitoring was provided.   PATIENT EDUCATION: Education details: Bilateral hand function, ROM, and strengthening Person educated: Patient Education method: Explanation, Demonstration, and Verbal cues Education comprehension: verbalized understanding and returned demonstration  HOME EXERCISE PROGRAM: Continue to assess HEP needs, and provide as needed.    GOALS: Goals reviewed with patient? Yes    LONG TERM GOALS: Target date: 01/19/2023  3.  Patient will demonstrate improved composite finger flexion to be able to firmly hold and use adaptive devices during ADLs, and IADLs.  Baseline: 12/06/2022: Pt. continues to present with increased MP, PIP, and DIP digit tightness/stiffness limiting the formulation of bilateral composites fists. 10/27/2022: Pt. presents with increased MP, PIP, and DIP digit tightness/stiffness limiting the formulation of bilateral composites fists during this progress reporting period limiting. Digit flexion to the [facility]: R:  2nd: 4 cm(3cm), 3rd: 6cm( 6cm), 4th: 6 cm (5cm), 5th: 5cm(5cm), L: 2nd: 8cm(8cm), 3rd: 8cm(6cm), 4th: 8cm(7cm), 5th: 6cm(6cm) 09/20/2022: Improving digit flexion to the [facility]. 09/13/22: improved digit flexion to the [facility]. 07/05/2022: Pt. presents with digit MP, PIP, and DIP extensor tightness limiting her ability to securely grip objects in her bilateral hands.  04/07/2022: Pt. Has improved with right 2nd, and 3rd digit flexion towards the [facility]. Pt. Continues to have difficulty securely  holding and applying deodorant.02/03/2022: Pt. Has improved with digit flexion, however, continues to have difficulty securely holding and using deodorant. 12/09/2021: Bilateral hand/digit MP, PIP, and DIP extension tightness limits her ability achieve digit flexion to hold, and apply deodorant. 10/28/2021:  Pt. continues to have difficulty holding the deodorant. 01/12/2021: Pt. Presents with limited digit extension. Pt. Is able to initiate holding deodorant, however is unable to hold it while using it  Goal status: Ongoing   4.  Pt. Will improve bilateral wrist extension  in preparation for anticipating, and initiating reaching for objects at the table.  Baseline: 12/06/2022: Pt continues to to progress with bilateral wrist extension in preparation fro functional reaching. 10/27/2022: Pt. Is improving with bilateral wrist extension. R: 46(64) L: 0(20) 09/20/2022: limited PROM in the Left wrist extension 2/2 tightness/stiffness. 09/13/22: R: 55(68), L: 0(12) 07/19/2022: Bilateral wrist extension in limited. 07/05/2022: Right: 40(60) Left: 0(12) 04/07/2022: Right: 40(55) Left: 5(18) 02/03/2022: Right: 34(55) Left: 3(4) 12/09/2021: Right  34(55)  Left  3(4) 10/28/2021:  Right 22(38), Left 0(15) 09/14/2021:  Right: 17(35), left 2(15)  Goal status: Ongoing  7.  Pt. Will increase bilateral lateral pinch strength by 2 lbs to be able to securely grasp items during ADLs, and IADL tasks.  Baseline: 12/06/2022: Pt. Is able to more securely hold objects during ADLs/IADLs 10/27/2022: Pt. Is improving with holding items with her bilateral thumbs. (Pinch meter out for calibration) 09/20/2022: lateral pinch continues to be limited 09/13/22: R 6.5# L 4# 07/19/2022: TBD 07/05/2022: TBD 07/2022: NT-Pinch meter out for calibration. 02/03/2022: Right: 6#, Left: 4# 12/09/2021: Right: 6#, Left: 4#  Goal status: Ongoing   9.  Pt. will complete plant care with modified independence.  Baseline:12/06/2022: Pt. Is independent with plant care in her  husband's room, however is not able to access her plants due to furniture blocking access to them. 10/27/2022: Pt. Is able to water plants that are closer, and within reach. Pt. continues to have difficulty reaching for thorough plant care. 09/20/2022: Pt. Continues to have difficulty reaching for thorough plant care. 09/13/22: continues to report intermittent difficulty 07/19/2022: Pt. Continues to be able to water, and care for some of her plants. Pt. Has more difficulty with plants that are harder to reach. 07/05/2022: pt. Is able to water, and care for some of her plants. Pt. Has more difficulty with plants that are harder to reach. 04/07/2022: Pt. is now able to hold a cup and water her plants.02/03/2022: Pt. has difficulty caring for her plants.  Goal status:Achieved   10.  Pt. will demonstrate adaptive techniques to assist with the efficiency of self-dressing, or morning care tasks.  Baseline: 12/06/2022: Pt. requires assist from staff, as Pt. Reports staff have decreased time. 10/27/2022: Pt. is able to assist with initiating UE dressing. Pt. requires assist from personal, and staff care aides. 09/20/2022: Pt. continues to require assist form personal, and staff care aides. 09/13/22: MODA dressing, reports she is often rushed 07/19/2022: Pt. continues to require assist with self-dressing/morning care tasks. 07/05/2022: Continue with goal. 04/07/2022: Pt. Continues to require assist with the efficiency of self-dressing, and morning care tasks.02/03/2022: Pt. requires assist from caregivers 2/2 time limitations during morning care.  Goal status: Ongoing  11.  Pt. will improve BUE strength to improve ADL, and IADL functioning.  Baseline: 12/06/2022: Continue 10/27/2022: shoulder flexion L 4/5, R 3+/5; shoulder abduction L 4+/5, R 3+/5. elbow flexion B 5/5, elbow extension 4/5 09/20/2022: Pt. Presents with limited BUE strength 09/13/22: shoulder flexion L 4/5, R 3/5; elbow flexion B 5/5, elbow extension 4-/5; shoulder  abduction L 4+/5, R 3+/5. 07/19/2022: BUE strength continues to be limited. 07/05/2022: shoulder flexion: right 4-/5, abduction: 3+/5, elbow flexion: right: 5/5, left 5/5, extension: right: 4-/5, left 4-/5, wrist extension: right: 3-/5, left: 2-/5  Goal status: Ongoing  12.  Pt. will improve bilateral thumb radial abduction in order to be able to hold the grab bars while standing with PT  Baseline: 12/06/2022: Pt. Continues to present  with limited thumb abduction, however  is improving holding onto the parallel bars.10/27/2022: thumb radial abduction: R: 30(42), L: 20(24  Goal status: Ongoing  13.  Pt. Will be able to securely hold, and stabilize medication bottles at a tabletop surface when opening, and closing them.  Baseline: 12/06/2022: Pt is improving with securely stabilizing medication bottles. 10/27/2022: Pt. Stabilizes bottles against her torso when attempting to open, and close them   Goal status:Ongoing    ASSESSMENT:  CLINICAL IMPRESSION:  Pt. continues to make progress, and is now able to use bilateral hands to fold her calendar in 4 quarters, and securely place it into a w/c  holder.  Pt. reports no pain today. Pt. is tolerate the exercises well with the dowel weight to 2.5#.  Pt. required assist for hand position, and placement of clips into her hand. Pt. continues to present with tightness at the bilateral thumb webspace, bilateral wrist flexor tightness, as well as digit MP, PIP, and DIP extensor tightness which continues to limit her ability to complete ADL tasks efficiently. Pt. continues to benefit from working on impoving ROM, and UE strength in order to work towards increasing bilateral hand grasp on objects, and increasing engagement of bilateral hands during ADLs, and IADL tasks.    PERFORMANCE DEFICITS: in functional skills including ADLs, IADLs, coordination, dexterity, ROM, strength, and UE functional use, cognitive skills including , and psychosocial skills including coping  strategies and environmental adaptation.   IMPAIRMENTS: are limiting patient from ADLs, IADLs, and leisure.   CO-MORBIDITIES: may have co-morbidities  that affects occupational performance. Patient will benefit from skilled OT to address above impairments and improve overall function.  MODIFICATION OR ASSISTANCE TO COMPLETE EVALUATION: Min-Moderate modification of tasks or assist with assess necessary to complete an evaluation.  OT OCCUPATIONAL PROFILE AND HISTORY: Detailed assessment: Review of records and additional review of physical, cognitive, psychosocial history related to current functional performance.  CLINICAL DECISION MAKING: Moderate - several treatment options, min-mod task modification necessary  REHAB POTENTIAL: Good for stated goals    PLAN:  OT FREQUENCY 2x's a week  OT DURATION: 12 weeks  PLANNED INTERVENTIONS ADL training, A/E training, UE ther. Ex, Manual therapy, neuromuscular re-education, moist heat modality, Paraffin Bath, Splinting, and  Pt./caregiver education   RECOMMENDED OTHER SERVICES: PT  CONSULTED AND AGREED WITH PLAN OF CARE: Patient  PLAN FOR NEXT SESSION: Continue Treatment as per established POC   Olegario Messier, MS, OTR/L 12/27/2022

## 2022-12-29 ENCOUNTER — Ambulatory Visit: Payer: Medicare HMO | Admitting: Occupational Therapy

## 2022-12-29 ENCOUNTER — Ambulatory Visit: Payer: Medicare HMO

## 2022-12-29 DIAGNOSIS — R278 Other lack of coordination: Secondary | ICD-10-CM

## 2022-12-29 DIAGNOSIS — S72001A Fracture of unspecified part of neck of right femur, initial encounter for closed fracture: Secondary | ICD-10-CM

## 2022-12-29 DIAGNOSIS — R2689 Other abnormalities of gait and mobility: Secondary | ICD-10-CM

## 2022-12-29 DIAGNOSIS — M6281 Muscle weakness (generalized): Secondary | ICD-10-CM

## 2022-12-29 DIAGNOSIS — R262 Difficulty in walking, not elsewhere classified: Secondary | ICD-10-CM

## 2022-12-29 DIAGNOSIS — R269 Unspecified abnormalities of gait and mobility: Secondary | ICD-10-CM

## 2022-12-29 DIAGNOSIS — S14129S Central cord syndrome at unspecified level of cervical spinal cord, sequela: Secondary | ICD-10-CM

## 2022-12-29 NOTE — Therapy (Signed)
Occupational Therapy Neuro Treatment Note   Patient Name: Sherry [name] MRN: [id number] DOB:[birth year], 88 y.o., female Today's Date: 11/29/2022  PCP:  Cindi Carbon PROVIDER: Earnestine Mealing, MD  END OF SESSION:   OT End of Session - 12/29/22 1152     Visit Number 95    Number of Visits 120    Date for OT Re-Evaluation 01/19/23    Authorization Time Period Progress report period starting 0812/2024    OT Start Time 1100    OT Stop Time 1145    OT Time Calculation (min) 45 min    Activity Tolerance Patient tolerated treatment well    Behavior During Therapy [facility] for tasks assessed/performed                 Past Medical History:  Diagnosis Date   Acute blood loss anemia    Arthritis    Cancer (HCC)    skin   Central cord syndrome at C6 level of cervical spinal cord (HCC) 11/29/2017   Hypertension    Protein-calorie malnutrition, severe (HCC) 01/24/2018   S/P insertion of IVC (inferior vena caval) filter 01/24/2018   Tetraparesis (HCC)    Past Surgical History:  Procedure Laterality Date   ANTERIOR CERVICAL DECOMP/DISCECTOMY FUSION N/A 11/29/2017   Procedure: Cervical five-six, six-seven Anterior Cervical Decompression Fusion;  Surgeon: Jadene Pierini, MD;  Location: [facility] OR;  Service: Neurosurgery;  Laterality: N/A;  Cervical five-six, six-seven Anterior Cervical Decompression Fusion   CATARACT EXTRACTION     FEMUR IM NAIL Left 08/16/2021   Procedure: INTRAMEDULLARY (IM) NAIL FEMORAL;  Surgeon: Deeann Saint, MD;  Location: [facility] ORS;  Service: Orthopedics;  Laterality: Left;   IR IVC FILTER PLMT / S&I /IMG GUID/MOD SED  12/08/2017   Patient Active Problem List   Diagnosis Date Noted   Age-related osteoporosis without current pathological fracture 07/27/2022   Mild recurrent major depression (HCC) 07/27/2022   Hypertensive heart disease with other congestive heart failure (HCC) 02/14/2022   Chronic indwelling Foley catheter 02/14/2022   Closed hip fracture  (HCC) 08/15/2021   DVT (deep venous thrombosis) (HCC) 08/15/2021   Quadriplegia (HCC) 08/15/2021   Fall 08/15/2021   Constipation due to slow transit 08/31/2018   Trauma 06/05/2018   Neuropathic pain 06/05/2018   Neurogenic bowel 06/05/2018   Vaginal yeast infection 01/30/2018   Healthcare-associated pneumonia 01/25/2018   Chronic allergic rhinitis 01/24/2018   Depression with anxiety 01/24/2018   UTI due to Klebsiella species 01/24/2018   Protein-calorie malnutrition, severe (HCC) 01/24/2018   S/P insertion of IVC (inferior vena caval) filter 01/24/2018   Tetraparesis (HCC) 01/20/2018   Neurogenic bladder 01/20/2018   Reactive depression    Benign essential HTN    Acute postoperative anemia due to expected blood loss    Central cord syndrome at C6 level of cervical spinal cord (HCC) 11/29/2017   Allergy to alpha-gal 11/25/2016   SCC (squamous cell carcinoma) 04/08/2014    ONSET DATE: 11/29/2017  REFERRING DIAG: Central Cord Syndrome at C6 of the Cervical Spinal Cord, Fall with Bilateral Closed Hip Fractures, with ORIF repair with Intramedullary Nailing of the Left Hip Fracture.   THERAPY DIAG:  Muscle weakness (generalized)  Other lack of coordination  Rationale for Evaluation and Treatment: Rehabilitation  SUBJECTIVE:   SUBJECTIVE STATEMENT:  Pt. reports that she is planning to go to her daughter's home for Thanksgiving.   Pt accompanied by: self, Personal Care aide  PERTINENT HISTORY: Central Cord Syndrome at C6 of the Cervical Spinal Cord, Fall  with Bilateral Closed Hip Fractures, with ORIF repair with Intramedullary Nailing of the Left Hip Fracture.   PRECAUTIONS: None  WEIGHT BEARING RESTRICTIONS: No  PAIN:  Are you having pain? No   LIVING ENVIRONMENT: Lives with: [facility]  Stairs: No Has following equipment at home: Wheelchair (power)  PLOF: Independent   PATIENT GOALS:  See below for established goals  OBJECTIVE:  Note:  Objective measures were completed at Evaluation unless otherwise noted.  HAND DOMINANCE: Right  ADLs: Caregiver assist with ADLs, and [facility]  MOBILITY STATUS: Uses a power w/c  ACTIVITY TOLERANCE: Activity tolerance: Fair  FUNCTIONAL OUTCOME MEASURES:  Measurements:   12/09/2021:   Shoulder flexion: R: 120(136), L: 138(150) Shoulder abduction: R: 108(120), L: 110(120) Elbow: R: 0-148, L: 0-146 Wrist flexion: R: 55(62), L: 78 Writ extension: R: 34(55), L: 3(4) RD: R: 12(20), L: 16(26) UD: R: 8(24), L: 6(18) Thumb radial abduction: R: 11(20), L: 8(20) Digit flexion to the [facility]: R:  2nd: 5cm(3.5cm), 3rd: 7.5 cm(5cm), 4th: 7cm(3cm), 5th: 6cm(5.5cm) L: 2nd: 8cm(8cm), 3rd: 8cm(8cm), 4th: 7.5cm(7cm), 5th: 7cm(7cm)   02/03/2022:   Shoulder flexion: R: 122(138), L: 148(154) Shoulder abduction: R: 109(125), L: 125(125 Elbow: R: 0-148, L: 0-146 Wrist flexion: R: 60(68), L: 79 Writ extension: R: 40(55), L: 3(10) RD: R: 14(24), L: 20(28) UD: R: 8(24), L: 6(18) Thumb radial abduction: R: 20(25), L: 8(20) Digit flexion to the [facility]: R:  2nd: 4.5cm(3cm), 3rd: 6.5cm(4.5cm), 4th: 6.5 cm(3cm), 5th: 4.5cm(5cm) L: 2nd: 8cm(8cm), 3rd: 8cm(7cm), 4th: 7.5cm(7cm), 5th: 6.5cm(6.5cm)   04/07/2022:   Shoulder flexion: R: 125(150), L: 160(160) Shoulder abduction: R: 109(125), L: 125(125 Elbow: R: 0-148, L: 0-146 Wrist flexion: R: 60(68), L: 79 Writ extension: R: 40(55), L: 5(18) RD: R: 14(24), L: 20(28) UD: R: 18(24), L: 8(18) Thumb radial abduction: R: 20(25), L: 8(20) Digit flexion to the [facility]: R:  2nd: 4.5 cm(4 cm), 3rd: 6.5cm(3cm), 4th: 7 cm (5cm), 5th: 5cm(5cm) L: 2nd: 8cm(8cm), 3rd: 8cm(7cm), 4th: 7.5cm(7cm), 5th: 7cm(7cm)   07/05/2022:   Shoulder flexion: R: 125(154), L: 160(160) Shoulder abduction: R: 95(130), L: 143(143) Elbow: R: 0-148, L: 0-146 Wrist flexion: R: 60(68), L: 79 Writ extension: R: 40(60), L: 0(12) RD: R: 10(24), L: 12(20) UD: R: 16(24), L: 8(16) Thumb  radial abduction: R: 20(25), L: 8(20) Digit flexion to the [facility]: R:  2nd: 4 cm(4cm), 3rd: 6.5cm( 3.5cm), 4th: 7 cm (5cm), 5th: 6cm(5cm) L: 2nd: 8cm(8cm), 3rd: 8cm(7cm), 4th: 8cm(7cm), 5th: 7cm(7cm)   09/13/2022:   Shoulder flexion: R: 125(154), L: 160(160) Shoulder abduction: R: 100(130), L: 143(143) Elbow: R: 0-150, L: 0-150 Wrist flexion: R: 55(68), L: 79 Writ extension: R: 45(60), L: 0(12) RD: R: 10(24), L: 12(20) UD: R: 16(24), L: 8(16) Thumb radial abduction: R: 20(25), L: 8(20) Digit flexion to the [facility]: R:  2nd: 4 cm(4cm), 3rd: 6cm( 5cm), 4th: 6 cm (4cm), 5th: 5cm(5cm) L: 2nd: 7cm(cm), 3rd: 7cm(6cm), 4th: 7cm(6cm), 5th: 6cm(6cm)   10/27/2022   Shoulder flexion: R: 128(154), L: 160(160) Shoulder abduction: R: 105(130), L: 143(143) Elbow: R: 0-150, L: 0-150 Wrist flexion: R: 55(68), L: 79 Writ extension: R: 46(64), L: 0(20) RD: R: 10(20), L: 18(24) UD: R: 118(28), L: 8(20) Thumb radial abduction: R: 30(42), L:20(24) Digit flexion to the [facility]: R:  2nd: 4 cm(3cm), 3rd: 6cm( 6cm), 4th: 6 cm (5cm), 5th: 5cm(5cm) L: 2nd: 8cm(8cm), 3rd: 8cm(6cm), 4th: 8cm(7cm), 5th: 6cm(6cm)      COORDINATION:  Impaired 2/2 bilateral hand digit MP, PIP, and DIP extension tightness  SENSATION: Intact  EDEMA: N/A   COGNITION: Overall cognitive status: WNL   VISION: Subjective report: Wears glasses, No changes in vision.   PERCEPTION: Intact  TODAY'S TREATMENT:                                                                                                                              DATE: 12/29/2022   Manual Therapy:   Pt. tolerated soft tissue massage to the volar, and dorsal surface of each digit on the bilateral hands, digit lateral, and sagittal bands 2/2 to stiffness following moist heat modality. Soft tissue mobilization was performed for carpal, and metacarpal spread stretches. Manual therapy was performed independent of, and in preparation for therapeutic Ex.     Therapeutic  Ex.    Pt. tolerated PROM followed by AROM bilateral wrist extension, PROM for bilateral digit MP, PIP, and DIP flexion, and extension, thumb radial, and palmar abduction. Pt. performed 2.5# dowel ex. for UE strengthening secondary to weakness. Bilateral shoulder flexion, chest press, circular patterns, and horizontal "V" patterns were performed. Pt. worked on [employer], and reciprocal motion using the UBE while seated for 6 min. with no resistance. Constant monitoring was provided.   PATIENT EDUCATION: Education details: Bilateral hand function, ROM, and strengthening Person educated: Patient Education method: Explanation, Demonstration, and Verbal cues Education comprehension: verbalized understanding and returned demonstration  HOME EXERCISE PROGRAM: Continue to assess HEP needs, and provide as needed.    GOALS: Goals reviewed with patient? Yes    LONG TERM GOALS: Target date: 01/19/2023  3.  Patient will demonstrate improved composite finger flexion to be able to firmly hold and use adaptive devices during ADLs, and IADLs.  Baseline: 12/06/2022: Pt. continues to present with increased MP, PIP, and DIP digit tightness/stiffness limiting the formulation of bilateral composites fists. 10/27/2022: Pt. presents with increased MP, PIP, and DIP digit tightness/stiffness limiting the formulation of bilateral composites fists during this progress reporting period limiting. Digit flexion to the [facility]: R:  2nd: 4 cm(3cm), 3rd: 6cm( 6cm), 4th: 6 cm (5cm), 5th: 5cm(5cm), L: 2nd: 8cm(8cm), 3rd: 8cm(6cm), 4th: 8cm(7cm), 5th: 6cm(6cm) 09/20/2022: Improving digit flexion to the [facility]. 09/13/22: improved digit flexion to the [facility]. 07/05/2022: Pt. presents with digit MP, PIP, and DIP extensor tightness limiting her ability to securely grip objects in her bilateral hands.  04/07/2022: Pt. Has improved with right 2nd, and 3rd digit flexion towards the [facility]. Pt. Continues to have difficulty securely holding and  applying deodorant.02/03/2022: Pt. Has improved with digit flexion, however, continues to have difficulty securely holding and using deodorant. 12/09/2021: Bilateral hand/digit MP, PIP, and DIP extension tightness limits her ability achieve digit flexion to hold, and apply deodorant. 10/28/2021:  Pt. continues to have difficulty holding the deodorant. 01/12/2021: Pt. Presents with limited digit extension. Pt. Is able to initiate holding deodorant, however is unable to hold it while using it  Goal status: Ongoing   4.  Pt. Will improve bilateral wrist  extension in preparation for anticipating, and initiating reaching for objects at the table.  Baseline: 12/06/2022: Pt continues to to progress with bilateral wrist extension in preparation fro functional reaching. 10/27/2022: Pt. Is improving with bilateral wrist extension. R: 46(64) L: 0(20) 09/20/2022: limited PROM in the Left wrist extension 2/2 tightness/stiffness. 09/13/22: R: 55(68), L: 0(12) 07/19/2022: Bilateral wrist extension in limited. 07/05/2022: Right: 40(60) Left: 0(12) 04/07/2022: Right: 40(55) Left: 5(18) 02/03/2022: Right: 34(55) Left: 3(4) 12/09/2021: Right  34(55)  Left  3(4) 10/28/2021:  Right 22(38), Left 0(15) 09/14/2021:  Right: 17(35), left 2(15)  Goal status: Ongoing  7.  Pt. Will increase bilateral lateral pinch strength by 2 lbs to be able to securely grasp items during ADLs, and IADL tasks.  Baseline: 12/06/2022: Pt. Is able to more securely hold objects during ADLs/IADLs 10/27/2022: Pt. Is improving with holding items with her bilateral thumbs. (Pinch meter out for calibration) 09/20/2022: lateral pinch continues to be limited 09/13/22: R 6.5# L 4# 07/19/2022: TBD 07/05/2022: TBD 07/2022: NT-Pinch meter out for calibration. 02/03/2022: Right: 6#, Left: 4# 12/09/2021: Right: 6#, Left: 4#  Goal status: Ongoing   9.  Pt. will complete plant care with modified independence.  Baseline:12/06/2022: Pt. Is independent with plant care in her husband's  room, however is not able to access her plants due to furniture blocking access to them. 10/27/2022: Pt. Is able to water plants that are closer, and within reach. Pt. continues to have difficulty reaching for thorough plant care. 09/20/2022: Pt. Continues to have difficulty reaching for thorough plant care. 09/13/22: continues to report intermittent difficulty 07/19/2022: Pt. Continues to be able to water, and care for some of her plants. Pt. Has more difficulty with plants that are harder to reach. 07/05/2022: pt. Is able to water, and care for some of her plants. Pt. Has more difficulty with plants that are harder to reach. 04/07/2022: Pt. is now able to hold a cup and water her plants.02/03/2022: Pt. has difficulty caring for her plants.  Goal status:Achieved   10.  Pt. will demonstrate adaptive techniques to assist with the efficiency of self-dressing, or morning care tasks.  Baseline: 12/06/2022: Pt. requires assist from staff, as Pt. Reports staff have decreased time. 10/27/2022: Pt. is able to assist with initiating UE dressing. Pt. requires assist from personal, and staff care aides. 09/20/2022: Pt. continues to require assist form personal, and staff care aides. 09/13/22: MODA dressing, reports she is often rushed 07/19/2022: Pt. continues to require assist with self-dressing/morning care tasks. 07/05/2022: Continue with goal. 04/07/2022: Pt. Continues to require assist with the efficiency of self-dressing, and morning care tasks.02/03/2022: Pt. requires assist from caregivers 2/2 time limitations during morning care.  Goal status: Ongoing  11.  Pt. will improve BUE strength to improve ADL, and IADL functioning.  Baseline: 12/06/2022: Continue 10/27/2022: shoulder flexion L 4/5, R 3+/5; shoulder abduction L 4+/5, R 3+/5. elbow flexion B 5/5, elbow extension 4/5 09/20/2022: Pt. Presents with limited BUE strength 09/13/22: shoulder flexion L 4/5, R 3/5; elbow flexion B 5/5, elbow extension 4-/5; shoulder abduction L  4+/5, R 3+/5. 07/19/2022: BUE strength continues to be limited. 07/05/2022: shoulder flexion: right 4-/5, abduction: 3+/5, elbow flexion: right: 5/5, left 5/5, extension: right: 4-/5, left 4-/5, wrist extension: right: 3-/5, left: 2-/5  Goal status: Ongoing  12.  Pt. will improve bilateral thumb radial abduction in order to be able to hold the grab bars while standing with PT  Baseline: 12/06/2022: Pt. Continues to present  with limited thumb abduction,  however is improving holding onto the parallel bars.10/27/2022: thumb radial abduction: R: 30(42), L: 20(24  Goal status: Ongoing  13.  Pt. Will be able to securely hold, and stabilize medication bottles at a tabletop surface when opening, and closing them.  Baseline: 12/06/2022: Pt is improving with securely stabilizing medication bottles. 10/27/2022: Pt. Stabilizes bottles against her torso when attempting to open, and close them   Goal status:Ongoing    ASSESSMENT:  CLINICAL IMPRESSION:  Pt. continues to report no pain.Pt. reports having had a recent appointment at the vascular clinic, as was told she has Lymphedema in her LEs, and is going to start compression pumps once a day as a result. Pt. is tolerate the exercises well with the dowel weight to 2.5#.  Pt. required assist for hand position, and placement of clips into her hand. Pt. continues to present with tightness at the bilateral thumb webspace, bilateral wrist flexor tightness, as well as digit MP, PIP, and DIP extensor tightness which continues to limit her ability to complete ADL tasks efficiently. Pt. continues to benefit from working on impoving ROM, and UE strength in order to work towards increasing bilateral hand grasp on objects, and increasing engagement of bilateral hands during ADLs, and IADL tasks.    PERFORMANCE DEFICITS: in functional skills including ADLs, IADLs, coordination, dexterity, ROM, strength, and UE functional use, cognitive skills including , and psychosocial  skills including coping strategies and environmental adaptation.   IMPAIRMENTS: are limiting patient from ADLs, IADLs, and leisure.   CO-MORBIDITIES: may have co-morbidities  that affects occupational performance. Patient will benefit from skilled OT to address above impairments and improve overall function.  MODIFICATION OR ASSISTANCE TO COMPLETE EVALUATION: Min-Moderate modification of tasks or assist with assess necessary to complete an evaluation.  OT OCCUPATIONAL PROFILE AND HISTORY: Detailed assessment: Review of records and additional review of physical, cognitive, psychosocial history related to current functional performance.  CLINICAL DECISION MAKING: Moderate - several treatment options, min-mod task modification necessary  REHAB POTENTIAL: Good for stated goals    PLAN:  OT FREQUENCY 2x's a week  OT DURATION: 12 weeks  PLANNED INTERVENTIONS ADL training, A/E training, UE ther. Ex, Manual therapy, neuromuscular re-education, moist heat modality, Paraffin Bath, Splinting, and  Pt./caregiver education   RECOMMENDED OTHER SERVICES: PT  CONSULTED AND AGREED WITH PLAN OF CARE: Patient  PLAN FOR NEXT SESSION: Continue Treatment as per established POC   Olegario Messier, MS, OTR/L 12/29/2022

## 2022-12-29 NOTE — Therapy (Signed)
OUTPATIENT PHYSICAL THERAPY NEURO TREATMENT/RECERT    Patient Name: Sherry [name] MRN: [id number] DOB:1934/04/01, 88 y.o., female Today's Date: 12/29/2022   PCP: Sherry Mealing, MD REFERRING PROVIDER: Deeann [name]   PT End of Session - 12/29/22 1120     Visit Number 88    Number of Visits 92    Date for PT Re-Evaluation 03/23/23    Authorization Type [employer] FOTO performed by PT on eval (7/20), score 12, Progress note on 03/10/2020; PN on 08/20/2020    Authorization Time Period Initial cert [id number]/[id number]/41/3244; Recert 01/11/2022-04/05/2022; Recert 04/07/2022-06/30/2022    Progress Note Due on Visit 90    PT Start Time 1145    PT Stop Time 1219    PT Time Calculation (min) 34 min    Equipment Utilized During Treatment Gait belt    Activity Tolerance Patient tolerated treatment well;No increased pain;Patient limited by fatigue    Behavior During Therapy [facility] for tasks assessed/performed                       Past Medical History:  Diagnosis Date   Acute blood loss anemia    Arthritis    Cancer (HCC)    skin   Central cord syndrome at C6 level of cervical spinal cord (HCC) 11/29/2017   Hypertension    Protein-calorie malnutrition, severe (HCC) 01/24/2018   S/P insertion of IVC (inferior vena caval) filter 01/24/2018   Tetraparesis (HCC)    Past Surgical History:  Procedure Laterality Date   ANTERIOR CERVICAL DECOMP/DISCECTOMY FUSION N/A 11/29/2017   Procedure: Cervical five-six, six-seven Anterior Cervical Decompression Fusion;  Surgeon: Sherry Pierini, MD;  Location: [facility] OR;  Service: Neurosurgery;  Laterality: N/A;  Cervical five-six, six-seven Anterior Cervical Decompression Fusion   CATARACT EXTRACTION     FEMUR IM NAIL Left 08/16/2021   Procedure: INTRAMEDULLARY (IM) NAIL FEMORAL;  Surgeon: Sherry Saint, MD;  Location: [facility] ORS;  Service: Orthopedics;  Laterality: Left;   IR IVC FILTER PLMT / S&I /IMG GUID/MOD SED  12/08/2017   Patient  Active Problem List   Diagnosis Date Noted   Age-related osteoporosis without current pathological fracture 07/27/2022   Mild recurrent major depression (HCC) 07/27/2022   Hypertensive heart disease with other congestive heart failure (HCC) 02/14/2022   Chronic indwelling Foley catheter 02/14/2022   Closed hip fracture (HCC) 08/15/2021   DVT (deep venous thrombosis) (HCC) 08/15/2021   Quadriplegia (HCC) 08/15/2021   Fall 08/15/2021   Constipation due to slow transit 08/31/2018   Trauma 06/05/2018   Neuropathic pain 06/05/2018   Neurogenic bowel 06/05/2018   Vaginal yeast infection 01/30/2018   Healthcare-associated pneumonia 01/25/2018   Chronic allergic rhinitis 01/24/2018   Depression with anxiety 01/24/2018   UTI due to Klebsiella species 01/24/2018   Protein-calorie malnutrition, severe (HCC) 01/24/2018   S/P insertion of IVC (inferior vena caval) filter 01/24/2018   Tetraparesis (HCC) 01/20/2018   Neurogenic bladder 01/20/2018   Reactive depression    Benign essential HTN    Acute postoperative anemia due to expected blood loss    Central cord syndrome at C6 level of cervical spinal cord (HCC) 11/29/2017   Allergy to alpha-gal 11/25/2016   SCC (squamous cell carcinoma) 04/08/2014    ONSET DATE: 08/15/2021 (fall with B hip fx); Initial injury was in 2019- quadraplegia due to central cord syndrome of C6 secondary to neck fracture.   REFERRING DIAG: Bilateral Hip fx; Left Open- s/p Left IM nail femoral on 08/16/2021; Right closed  THERAPY DIAG:  Muscle weakness (generalized)  Other abnormalities of gait and mobility  Difficulty in walking, not elsewhere classified  Central cord syndrome, sequela (HCC)  Closed fracture of both hips, initial encounter (HCC)  Abnormality of gait and mobility  Other lack of coordination  Rationale for Evaluation and Treatment Rehabilitation  SUBJECTIVE:   SUBJECTIVE STATEMENT:  Patient report she went to vein clinic yesterday and  her veins tested out okay but needs to wear compression stockings and may need something that helps with lymphedema- compression machine x 1 hour per day      Pt accompanied by:  Sherry [name]-   PERTINENT HISTORY: Sherry [name] is an 87yoF who experienced a fall at home on 08/15/2021 with hip fracture, s/p Left hip ORIF. PMH: quadriplegia due to central cord syndrome of C6 secondary to neck fracture, HTN, depression with anxiety, neurogenic bladder, bilateral DVT on Xarelto (s/p of IVC filter placement). Prior history of significant PT/OT with improvement of function. Pt has full use of shoulder/elbows, limited use of hands, adaptive self feeding sucessful.     PAIN:  None current but does endorse some left thigh pain intermittent  OBJECTIVE:    TODAY'S TREATMENT:  12/29/22     Physical therapy treatment session today consisted of completing assessment of goals and administration of testing as demonstrated and documented in flow sheet, treatment, and goals section of this note. Addition treatments may be found below.   Therapeutic exercises: performed either in seated or reclined position.   Standing in // bars - 2 min 40 sec   MMT with Each LE- see goal for details   -Active Assisted Seated marches-  2 x 15 reps each LE -Seated LAQ 2x15 each LE AROM with near full ROM on right  for 1st set and only able to perform 5 on second set and AROM on Left - 2x 15 Reclined ankle DF/PF  2 x 10 reps LLE Reclined quad sets with 5 sec hold each x 15 reps each LE Reclined Hip add/ABD AROM 2 x 15 reps  Reclined assisted heel slide 2x 15 reps each LE Reclined manual resistive leg press 2 x 10 reps each LE                 PATIENT EDUCATION: Education details: cues for isolated joint exercises  HOME EXERCISE PROGRAM: No changes at this time  Access Code: [facility] URL: [email or web address] Date: 11/23/2021 Prepared by: Sherry [name] Exercises - Seated Gluteal Sets -  2 x daily - 7 x weekly - 2 sets - 10 reps - 5 hold - Seated Quad Set - 2 x daily - 7 x weekly - 2 sets - 10 reps - 5 hold - Seated Long Arc Quad - 2 x daily - 7 x weekly - 2 sets - 10 reps - 5 hold - Seated March - 2 x daily - 7 x weekly - 2 sets - 10 reps - 5 hold - Seated Hip Abduction - 2 x daily - 7 x weekly - 2 sets - 10 reps - 5 hold - Seated Shoulder Shrugs - 2 x daily - 7 x weekly - 2 sets - 10 reps - 5 hold - Wheelchair Pressure Relief - 2 x daily - 7 x weekly - 2 sets - 10 reps - 5 hold  GOALS: Goals reviewed with patient? Yes  SHORT TERM GOALS: Target date: 02/22/2022  Pt will be independent with initial UE strengthening HEP in order to improve strength and balance  in order to decrease fall risk and improve function at home and work. Baseline: 10/19/2021- patient with no formal UE HEP; 12/02/2021= Patient verbalized knowledge of HEP including use of theraband for UE strengthening.  Goal status: GOAL MET  LONG TERM GOALS: Target date: 03/23/2023  Pt will be independent with final for UE/LE HEP in order to improve strength and balance in order to decrease fall risk and improve function at home and work. Baseline: Patient is currently BLE NWB and unable to participate in HEP. Has order for UE strengthening. 01/11/2022- Patient now able to participate in LE strengthening although NWB still- good understanding for some basic exercises. Will keep goal active to incorporate progressive LE strengthening exercises. 04/07/2022= Patient still participating in progressive LE seated HEP and has no questions at this time.  Goal status: MET  2.  Pt will improve FOTO to target score of 35  to display perceived improvements in ability to complete ADL's.  Baseline: 10/19/2021= 12; 12/02/2021= 17; 01/11/2022=17; 04/07/2022= 17; 07/05/2022=17; 10/06/2022- outcome measure not appropriate as patient in non-ambulatory and not consistently standing. Goal status: Goal not appropriate  3.  Pt will increase strength of B UE   by at least 1/2 MMT grade in order to demonstrate improvement in strength and function  Baseline: patient has range of 2-/5 to 4/5 BUE Strength; 12/02/2021= 4/5 except with wrist ext. 01/11/2022= 4/5 B UE strength expept for wrist Goal status: Goal revised-no longer appropriate- working with OT on all UE strengthening.   4. Pt. Will increase strength of RLE by at least 1/2 MMT grade in order to demonstrate improvement in Standing/transfers.  Baseline: 2-/5 Right hip flex/knee ext/flex; 04/07/2022= 2- with right hip flex/knee ext (lacking 28 deg from zero) 4/8= Patient able to ext right knee lacking 18 deg from zero. 07/05/2022=Left knee approx 8 deg from zero and right knee = 23 deg from zero; 12/29/2022 - Left LE = full ROM 4-/5 and right knee ext= 23 deg from zero= 2+/5 (gravity minimized)   Goal status: Progressing  5. Pt. Will demo ability to stand pivot transfer with max assist for improved functional mobility and less dependent need on mechanical device.   Baseline: Dependent on hoyer lift for all transfers; 04/07/2022- Unable to test secondary to patient with recent UTI and right hand procedure - will attempt to reassess next visit. ; 04/12/22: Unable to successfully stand due to feet plates on loaned power w/c; 05/10/2022- Patient still having to use rental chair and has not received her original power w/c back to practice SPT. 07/04/2021= Patient just received new power w/c and attempted standing from 1st time today- Patient able to stand with max assist today from w/c and did not attempt to perform pivot transfer- difficulty with static standing and placing weight on right LE. 10/06/2022- Not assessed today- patient needs new AFO prior to attempting transfers; 12/29/2022= Patient has not attempted SPT yet - still trying to improve LE strength enough to stand well without knee buckling with weight shifting.   Goal status: ONGOING  6. Pt. Will demonstrate improved functional LE strength as seen by ability to  stand > 2 min for improved transfer ability and pregait abilities.   Baseline: not assessed today due to recent dx: UTI; 04/07/2022- Unable to test secondary to patient with recent UTI and right hand procedure - will attempt to reassess next visit; 04/12/22: Unable to successfully stand due to feet plates on loaned power w/c 05/10/2022- Patient still having to use rental chair and  has not received her original power w/c back to practice Standing. 07/05/2022- Patient able to stand with max assist at trunk today for 2 min 3 sec. Will keep goal active to ensure consistency. 10/06/2022= Unable to assess today- patient with some recent blood pressure issues and needs new AFO. 11/24/2022= Patient demonstrated multiple rounds of standing in // bars- holding up to 1:45 min today but has been able to exhibit 2 min in recent past- working on consistency. 12/29/2022= 2 min 40 sec with patient demonstrating improved overall posture and ability to contract glutes and quads with just min assist at trunk today.   Goal status: MET  7. Pt. Will demonstrate improved functional LE strength as seen by ability to stand > 5 min for improved transfer ability and pregait abilities. Baseline: 12/29/2022= 2 min 40 sec with patient demonstrating improved overall posture and ability to contract glutes and quads with just min assist at trunk today.  Goal Status: NEW  8. Patient will perform sit to stand transfer with moderate assist consistently > 75% of time for improved transfer ability and less dependence on [name].   Baseline: 12/29/2022- Patient currently max assist level assistance with all sit to stand activities.   Goal status: New   ASSESSMENT:  CLINICAL IMPRESSION: Patient presents with excellent motivation for today's recert session. She was able to demo some good progress overall during this most recent cert. She is demonstrating imrpoved overall LE strength and less knee buckling and improved ability to raise right leg  against gravity. She is demonstrating improved functional LE strength as seen by ability  to contract gluteals in standing which directly improved her standing time and now met her current standing goal. Added a new standing goal to reflect patient progress yet continued need to improve her overall LE strength for desire to progress to standing/transfers. Patient's condition has the potential to improve in response to therapy. Maximum improvement is yet to be obtained. The anticipated improvement is attainable and reasonable in a generally predictable time.  Pt will continue to benefit from skilled PT services to optimize independence and reduced [name] support.    OBJECTIVE IMPAIRMENTS Abnormal gait, decreased activity tolerance, decreased balance, decreased coordination, decreased endurance, decreased mobility, difficulty walking, decreased ROM, decreased strength, hypomobility, impaired flexibility, impaired UE functional use, postural dysfunction, and pain.   ACTIVITY LIMITATIONS carrying, lifting, bending, sitting, standing, squatting, sleeping, stairs, transfers, bed mobility, continence, bathing, toileting, dressing, self feeding, reach over head, hygiene/grooming, and caring for others  PARTICIPATION LIMITATIONS: meal prep, cleaning, laundry, medication management, personal finances, interpersonal relationship, driving, shopping, community activity, and yard work  PERSONAL FACTORS Age, Time since onset of injury/illness/exacerbation, and 1-2 comorbidities: HTN, cervical Sx  are also affecting patient's functional outcome.   REHAB POTENTIAL: Good  CLINICAL DECISION MAKING: Evolving/moderate complexity  EVALUATION COMPLEXITY: Moderate  PLAN: PT FREQUENCY: 1-2x/week  PT DURATION: 12 weeks  PLANNED INTERVENTIONS: Therapeutic exercises, Therapeutic activity, Neuromuscular re-education, Balance training, Gait training, Patient/Family education, Self Care, Joint mobilization, DME  instructions, Dry Needling, Electrical stimulation, Wheelchair mobility training, Spinal mobilization, Cryotherapy, Moist heat, and Manual therapy  PLAN FOR NEXT SESSION:  LE and core strength training, Sit to stand and static standing; progress to SPT as able.     12:23 PM, 12/29/22 Louis Meckel, PT Physical Therapist - [place] [facility]  Outpatient Physical Therapy- Main Campus [phone]

## 2023-01-03 ENCOUNTER — Ambulatory Visit: Payer: Medicare HMO

## 2023-01-03 ENCOUNTER — Ambulatory Visit: Payer: Medicare HMO | Attending: Internal Medicine

## 2023-01-03 DIAGNOSIS — M6281 Muscle weakness (generalized): Secondary | ICD-10-CM

## 2023-01-03 DIAGNOSIS — S72001A Fracture of unspecified part of neck of right femur, initial encounter for closed fracture: Secondary | ICD-10-CM | POA: Diagnosis present

## 2023-01-03 DIAGNOSIS — S72002A Fracture of unspecified part of neck of left femur, initial encounter for closed fracture: Secondary | ICD-10-CM | POA: Insufficient documentation

## 2023-01-03 DIAGNOSIS — S14129S Central cord syndrome at unspecified level of cervical spinal cord, sequela: Secondary | ICD-10-CM

## 2023-01-03 DIAGNOSIS — R2689 Other abnormalities of gait and mobility: Secondary | ICD-10-CM

## 2023-01-03 DIAGNOSIS — R262 Difficulty in walking, not elsewhere classified: Secondary | ICD-10-CM | POA: Diagnosis present

## 2023-01-03 DIAGNOSIS — R269 Unspecified abnormalities of gait and mobility: Secondary | ICD-10-CM | POA: Insufficient documentation

## 2023-01-03 DIAGNOSIS — R278 Other lack of coordination: Secondary | ICD-10-CM | POA: Diagnosis present

## 2023-01-03 NOTE — Therapy (Signed)
OUTPATIENT PHYSICAL THERAPY NEURO TREATMENT   Patient Name: Sherry [name] MRN: [id number] DOB:13-Aug-1934, 88 y.o., female Today's Date: 01/03/2023   PCP: Sherry Mealing, MD REFERRING PROVIDER: Deeann [name]   PT End of Session - 01/03/23 1116     Visit Number 89    Number of Visits 92    Date for PT Re-Evaluation 03/23/23    Authorization Type [employer] FOTO performed by PT on eval (7/20), score 12, Progress note on 03/10/2020; PN on 08/20/2020    Authorization Time Period Initial cert 2/35/5732-20/25/4270; Recert 01/11/2022-04/05/2022; Recert 04/07/2022-06/30/2022    Progress Note Due on Visit 90    PT Start Time 1145    PT Stop Time 1221    PT Time Calculation (min) 36 min    Equipment Utilized During Treatment Gait belt    Activity Tolerance Patient tolerated treatment well;No increased pain;Patient limited by fatigue    Behavior During Therapy [facility] for tasks assessed/performed                        Past Medical History:  Diagnosis Date   Acute blood loss anemia    Arthritis    Cancer (HCC)    skin   Central cord syndrome at C6 level of cervical spinal cord (HCC) 11/29/2017   Hypertension    Protein-calorie malnutrition, severe (HCC) 01/24/2018   S/P insertion of IVC (inferior vena caval) filter 01/24/2018   Tetraparesis (HCC)    Past Surgical History:  Procedure Laterality Date   ANTERIOR CERVICAL DECOMP/DISCECTOMY FUSION N/A 11/29/2017   Procedure: Cervical five-six, six-seven Anterior Cervical Decompression Fusion;  Surgeon: Sherry Pierini, MD;  Location: [facility] OR;  Service: Neurosurgery;  Laterality: N/A;  Cervical five-six, six-seven Anterior Cervical Decompression Fusion   CATARACT EXTRACTION     FEMUR IM NAIL Left 08/16/2021   Procedure: INTRAMEDULLARY (IM) NAIL FEMORAL;  Surgeon: Sherry Saint, MD;  Location: [facility] ORS;  Service: Orthopedics;  Laterality: Left;   IR IVC FILTER PLMT / S&I /IMG GUID/MOD SED  12/08/2017   Patient Active  Problem List   Diagnosis Date Noted   Age-related osteoporosis without current pathological fracture 07/27/2022   Mild recurrent major depression (HCC) 07/27/2022   Hypertensive heart disease with other congestive heart failure (HCC) 02/14/2022   Chronic indwelling Foley catheter 02/14/2022   Closed hip fracture (HCC) 08/15/2021   DVT (deep venous thrombosis) (HCC) 08/15/2021   Quadriplegia (HCC) 08/15/2021   Fall 08/15/2021   Constipation due to slow transit 08/31/2018   Trauma 06/05/2018   Neuropathic pain 06/05/2018   Neurogenic bowel 06/05/2018   Vaginal yeast infection 01/30/2018   Healthcare-associated pneumonia 01/25/2018   Chronic allergic rhinitis 01/24/2018   Depression with anxiety 01/24/2018   UTI due to Klebsiella species 01/24/2018   Protein-calorie malnutrition, severe (HCC) 01/24/2018   S/P insertion of IVC (inferior vena caval) filter 01/24/2018   Tetraparesis (HCC) 01/20/2018   Neurogenic bladder 01/20/2018   Reactive depression    Benign essential HTN    Acute postoperative anemia due to expected blood loss    Central cord syndrome at C6 level of cervical spinal cord (HCC) 11/29/2017   Allergy to alpha-gal 11/25/2016   SCC (squamous cell carcinoma) 04/08/2014    ONSET DATE: 08/15/2021 (fall with B hip fx); Initial injury was in 2019- quadraplegia due to central cord syndrome of C6 secondary to neck fracture.   REFERRING DIAG: Bilateral Hip fx; Left Open- s/p Left IM nail femoral on 08/16/2021; Right closed  THERAPY DIAG:  Muscle weakness (generalized)  Other lack of coordination  Central cord syndrome, sequela (HCC)  Other abnormalities of gait and mobility  Difficulty in walking, not elsewhere classified  Closed fracture of both hips, initial encounter (HCC)  Abnormality of gait and mobility  Rationale for Evaluation and Treatment Rehabilitation  SUBJECTIVE:   SUBJECTIVE STATEMENT:  Patient reports having a good holiday week and states no pain  today. States her legs don't feel as swollen today. Agreeable to practice transfers.       Pt accompanied by:  Paid caregiver-   PERTINENT HISTORY: Sherry [name] is an 87yoF who experienced a fall at home on 08/15/2021 with hip fracture, s/p Left hip ORIF. PMH: quadriplegia due to central cord syndrome of C6 secondary to neck fracture, HTN, depression with anxiety, neurogenic bladder, bilateral DVT on Xarelto (s/p of IVC filter placement). Prior history of significant PT/OT with improvement of function. Pt has full use of shoulder/elbows, limited use of hands, adaptive self feeding sucessful.     PAIN:  None current but does endorse some left thigh pain intermittent  OBJECTIVE:    TODAY'S TREATMENT:  01/03/23     Therapeutic activities:   Sit to stand from power chair with patient holding onto back of PT's arms- Max assist - much improved ability to stand erect with good ability to tighten gluteals.- Multiple attempts with right knee blocked and left foot blocked to avoid left LE kicking out.   Stand pivot transfer: From w/c to edge of mat and back x 2 trials- Focusing on positioning feet and max assist to stand and patient was able to pick pivot foot up vs. Just pivoting or turning foot.      THEREX:   -Active Assisted Seated marches- 10 reps each LE -Seated LAQ 2x15 each Right LE only today with eccentric control                 PATIENT EDUCATION: Education details: cues for isolated joint exercises  HOME EXERCISE PROGRAM: No changes at this time  Access Code: [facility] URL: [email or web address] Date: 11/23/2021 Prepared by: Sherry [name] Exercises - Seated Gluteal Sets - 2 x daily - 7 x weekly - 2 sets - 10 reps - 5 hold - Seated Quad Set - 2 x daily - 7 x weekly - 2 sets - 10 reps - 5 hold - Seated Long Arc Quad - 2 x daily - 7 x weekly - 2 sets - 10 reps - 5 hold - Seated March - 2 x daily - 7 x weekly - 2 sets - 10 reps - 5 hold - Seated Hip  Abduction - 2 x daily - 7 x weekly - 2 sets - 10 reps - 5 hold - Seated Shoulder Shrugs - 2 x daily - 7 x weekly - 2 sets - 10 reps - 5 hold - Wheelchair Pressure Relief - 2 x daily - 7 x weekly - 2 sets - 10 reps - 5 hold  GOALS: Goals reviewed with patient? Yes  SHORT TERM GOALS: Target date: 02/22/2022  Pt will be independent with initial UE strengthening HEP in order to improve strength and balance in order to decrease fall risk and improve function at home and work. Baseline: 10/19/2021- patient with no formal UE HEP; 12/02/2021= Patient verbalized knowledge of HEP including use of theraband for UE strengthening.  Goal status: GOAL MET  LONG TERM GOALS: Target date: 03/23/2023  Pt will be independent with final for UE/LE HEP in  order to improve strength and balance in order to decrease fall risk and improve function at home and work. Baseline: Patient is currently BLE NWB and unable to participate in HEP. Has order for UE strengthening. 01/11/2022- Patient now able to participate in LE strengthening although NWB still- good understanding for some basic exercises. Will keep goal active to incorporate progressive LE strengthening exercises. 04/07/2022= Patient still participating in progressive LE seated HEP and has no questions at this time.  Goal status: MET  2.  Pt will improve FOTO to target score of 35  to display perceived improvements in ability to complete ADL's.  Baseline: 10/19/2021= 12; 12/02/2021= 17; 01/11/2022=17; 04/07/2022= 17; 07/05/2022=17; 10/06/2022- outcome measure not appropriate as patient in non-ambulatory and not consistently standing. Goal status: Goal not appropriate  3.  Pt will increase strength of B UE  by at least 1/2 MMT grade in order to demonstrate improvement in strength and function  Baseline: patient has range of 2-/5 to 4/5 BUE Strength; 12/02/2021= 4/5 except with wrist ext. 01/11/2022= 4/5 B UE strength expept for wrist Goal status: Goal revised-no longer  appropriate- working with OT on all UE strengthening.   4. Pt. Will increase strength of RLE by at least 1/2 MMT grade in order to demonstrate improvement in Standing/transfers.  Baseline: 2-/5 Right hip flex/knee ext/flex; 04/07/2022= 2- with right hip flex/knee ext (lacking 28 deg from zero) 4/8= Patient able to ext right knee lacking 18 deg from zero. 07/05/2022=Left knee approx 8 deg from zero and right knee = 23 deg from zero; 12/29/2022 - Left LE = full ROM 4-/5 and right knee ext= 23 deg from zero= 2+/5 (gravity minimized)   Goal status: Progressing  5. Pt. Will demo ability to stand pivot transfer with max assist for improved functional mobility and less dependent need on mechanical device.   Baseline: Dependent on hoyer lift for all transfers; 04/07/2022- Unable to test secondary to patient with recent UTI and right hand procedure - will attempt to reassess next visit. ; 04/12/22: Unable to successfully stand due to feet plates on loaned power w/c; 05/10/2022- Patient still having to use rental chair and has not received her original power w/c back to practice SPT. 07/04/2021= Patient just received new power w/c and attempted standing from 1st time today- Patient able to stand with max assist today from w/c and did not attempt to perform pivot transfer- difficulty with static standing and placing weight on right LE. 10/06/2022- Not assessed today- patient needs new AFO prior to attempting transfers; 12/29/2022= Patient has not attempted SPT yet - still trying to improve LE strength enough to stand well without knee buckling with weight shifting.   Goal status: ONGOING  6. Pt. Will demonstrate improved functional LE strength as seen by ability to stand > 2 min for improved transfer ability and pregait abilities.   Baseline: not assessed today due to recent dx: UTI; 04/07/2022- Unable to test secondary to patient with recent UTI and right hand procedure - will attempt to reassess next visit; 04/12/22: Unable to  successfully stand due to feet plates on loaned power w/c 05/10/2022- Patient still having to use rental chair and has not received her original power w/c back to practice Standing. 07/05/2022- Patient able to stand with max assist at trunk today for 2 min 3 sec. Will keep goal active to ensure consistency. 10/06/2022= Unable to assess today- patient with some recent blood pressure issues and needs new AFO. 11/24/2022= Patient demonstrated multiple rounds of standing  in // bars- holding up to 1:45 min today but has been able to exhibit 2 min in recent past- working on consistency. 12/29/2022= 2 min 40 sec with patient demonstrating improved overall posture and ability to contract glutes and quads with just min assist at trunk today.   Goal status: MET  7. Pt. Will demonstrate improved functional LE strength as seen by ability to stand > 5 min for improved transfer ability and pregait abilities. Baseline: 12/29/2022= 2 min 40 sec with patient demonstrating improved overall posture and ability to contract glutes and quads with just min assist at trunk today.  Goal Status: NEW  8. Patient will perform sit to stand transfer with moderate assist consistently > 75% of time for improved transfer ability and less dependence on caregiver.   Baseline: 12/29/2022- Patient currently max assist level assistance with all sit to stand activities.   Goal status: New   ASSESSMENT:  CLINICAL IMPRESSION: Patient presented with good overall transfer ability- able to demo improved ability to stand and come into erect position without any evidence of knee buckling today. She was able to dynamically weight shift and pivot her foot by picking it up today. She has not been able to do this since injuring her hips over 1 year ago. She was excited at how well she was able to stand and pivot well today.  Pt will continue to benefit from skilled PT services to optimize independence and reduced caregiver support.    OBJECTIVE  IMPAIRMENTS Abnormal gait, decreased activity tolerance, decreased balance, decreased coordination, decreased endurance, decreased mobility, difficulty walking, decreased ROM, decreased strength, hypomobility, impaired flexibility, impaired UE functional use, postural dysfunction, and pain.   ACTIVITY LIMITATIONS carrying, lifting, bending, sitting, standing, squatting, sleeping, stairs, transfers, bed mobility, continence, bathing, toileting, dressing, self feeding, reach over head, hygiene/grooming, and caring for others  PARTICIPATION LIMITATIONS: meal prep, cleaning, laundry, medication management, personal finances, interpersonal relationship, driving, shopping, community activity, and yard work  PERSONAL FACTORS Age, Time since onset of injury/illness/exacerbation, and 1-2 comorbidities: HTN, cervical Sx  are also affecting patient's functional outcome.   REHAB POTENTIAL: Good  CLINICAL DECISION MAKING: Evolving/moderate complexity  EVALUATION COMPLEXITY: Moderate  PLAN: PT FREQUENCY: 1-2x/week  PT DURATION: 12 weeks  PLANNED INTERVENTIONS: Therapeutic exercises, Therapeutic activity, Neuromuscular re-education, Balance training, Gait training, Patient/Family education, Self Care, Joint mobilization, DME instructions, Dry Needling, Electrical stimulation, Wheelchair mobility training, Spinal mobilization, Cryotherapy, Moist heat, and Manual therapy  PLAN FOR NEXT SESSION:  LE and core strength training, Sit to stand and static standing; progress to SPT as able.     1:18 PM, 01/03/23 Louis Meckel, PT Physical Therapist - [facility]  Outpatient Physical Therapy- Main Campus [phone]

## 2023-01-03 NOTE — Therapy (Signed)
Occupational Therapy Neuro Treatment Note   Patient Name: Sherry [name] MRN: [id number] DOB:02/14/1934, 88 y.o., female Today's Date: 11/29/2022  PCP:  Cindi Carbon PROVIDER: Earnestine Mealing, MD  END OF SESSION:   OT End of Session - 01/03/23 1108     Visit Number 96    Number of Visits 120    Date for OT Re-Evaluation 01/19/23    Authorization Time Period Progress report period starting 0812/2024    Progress Note Due on Visit 10    OT Start Time 1100    OT Stop Time 1145    OT Time Calculation (min) 45 min    Equipment Utilized During Treatment powered wheelchair    Activity Tolerance Patient tolerated treatment well    Behavior During Therapy WFL for tasks assessed/performed                Past Medical History:  Diagnosis Date   Acute blood loss anemia    Arthritis    Cancer (HCC)    skin   Central cord syndrome at C6 level of cervical spinal cord (HCC) 11/29/2017   Hypertension    Protein-calorie malnutrition, severe (HCC) 01/24/2018   S/P insertion of IVC (inferior vena caval) filter 01/24/2018   Tetraparesis (HCC)    Past Surgical History:  Procedure Laterality Date   ANTERIOR CERVICAL DECOMP/DISCECTOMY FUSION N/A 11/29/2017   Procedure: Cervical five-six, six-seven Anterior Cervical Decompression Fusion;  Surgeon: Jadene Pierini, MD;  Location: [facility] OR;  Service: Neurosurgery;  Laterality: N/A;  Cervical five-six, six-seven Anterior Cervical Decompression Fusion   CATARACT EXTRACTION     FEMUR IM NAIL Left 08/16/2021   Procedure: INTRAMEDULLARY (IM) NAIL FEMORAL;  Surgeon: Deeann Saint, MD;  Location: [facility] ORS;  Service: Orthopedics;  Laterality: Left;   IR IVC FILTER PLMT / S&I /IMG GUID/MOD SED  12/08/2017   Patient Active Problem List   Diagnosis Date Noted   Age-related osteoporosis without current pathological fracture 07/27/2022   Mild recurrent major depression (HCC) 07/27/2022   Hypertensive heart disease with other congestive heart  failure (HCC) 02/14/2022   Chronic indwelling Foley catheter 02/14/2022   Closed hip fracture (HCC) 08/15/2021   DVT (deep venous thrombosis) (HCC) 08/15/2021   Quadriplegia (HCC) 08/15/2021   Fall 08/15/2021   Constipation due to slow transit 08/31/2018   Trauma 06/05/2018   Neuropathic pain 06/05/2018   Neurogenic bowel 06/05/2018   Vaginal yeast infection 01/30/2018   Healthcare-associated pneumonia 01/25/2018   Chronic allergic rhinitis 01/24/2018   Depression with anxiety 01/24/2018   UTI due to Klebsiella species 01/24/2018   Protein-calorie malnutrition, severe (HCC) 01/24/2018   S/P insertion of IVC (inferior vena caval) filter 01/24/2018   Tetraparesis (HCC) 01/20/2018   Neurogenic bladder 01/20/2018   Reactive depression    Benign essential HTN    Acute postoperative anemia due to expected blood loss    Central cord syndrome at C6 level of cervical spinal cord (HCC) 11/29/2017   Allergy to alpha-gal 11/25/2016   SCC (squamous cell carcinoma) 04/08/2014   ONSET DATE: 11/29/2017  REFERRING DIAG: Central Cord Syndrome at C6 of the Cervical Spinal Cord, Fall with Bilateral Closed Hip Fractures, with ORIF repair with Intramedullary Nailing of the Left Hip Fracture.   THERAPY DIAG:  Muscle weakness (generalized)  Other lack of coordination  Rationale for Evaluation and Treatment: Rehabilitation  SUBJECTIVE:   SUBJECTIVE STATEMENT: Pt reports she had a nice holiday with her family last week.  Pt accompanied by: self, Personal Care aide  PERTINENT  HISTORY: Central Cord Syndrome at C6 of the Cervical Spinal Cord, Fall with Bilateral Closed Hip Fractures, with ORIF repair with Intramedullary Nailing of the Left Hip Fracture.   PRECAUTIONS: None  WEIGHT BEARING RESTRICTIONS: No  PAIN:  Are you having pain? No pain, just shoulder stiffness   LIVING ENVIRONMENT: Lives with: Sherry [name]  Stairs: No Has following equipment at home: Wheelchair  (power)  PLOF: Independent   PATIENT GOALS:  See below for established goals  OBJECTIVE:  Note: Objective measures were completed at Evaluation unless otherwise noted.  HAND DOMINANCE: Right  ADLs: Caregiver assist with ADLs, and [facility]  MOBILITY STATUS: Uses a power w/c  ACTIVITY TOLERANCE: Activity tolerance: Fair  FUNCTIONAL OUTCOME MEASURES:  Measurements:   12/09/2021:   Shoulder flexion: R: 120(136), L: 138(150) Shoulder abduction: R: 108(120), L: 110(120) Elbow: R: 0-148, L: 0-146 Wrist flexion: R: 55(62), L: 78 Writ extension: R: 34(55), L: 3(4) RD: R: 12(20), L: 16(26) UD: R: 8(24), L: 6(18) Thumb radial abduction: R: 11(20), L: 8(20) Digit flexion to the [facility]: R:  2nd: 5cm(3.5cm), 3rd: 7.5 cm(5cm), 4th: 7cm(3cm), 5th: 6cm(5.5cm) L: 2nd: 8cm(8cm), 3rd: 8cm(8cm), 4th: 7.5cm(7cm), 5th: 7cm(7cm)   02/03/2022:   Shoulder flexion: R: 122(138), L: 148(154) Shoulder abduction: R: 109(125), L: 125(125 Elbow: R: 0-148, L: 0-146 Wrist flexion: R: 60(68), L: 79 Writ extension: R: 40(55), L: 3(10) RD: R: 14(24), L: 20(28) UD: R: 8(24), L: 6(18) Thumb radial abduction: R: 20(25), L: 8(20) Digit flexion to the [facility]: R:  2nd: 4.5cm(3cm), 3rd: 6.5cm(4.5cm), 4th: 6.5 cm(3cm), 5th: 4.5cm(5cm) L: 2nd: 8cm(8cm), 3rd: 8cm(7cm), 4th: 7.5cm(7cm), 5th: 6.5cm(6.5cm)   04/07/2022:   Shoulder flexion: R: 125(150), L: 160(160) Shoulder abduction: R: 109(125), L: 125(125 Elbow: R: 0-148, L: 0-146 Wrist flexion: R: 60(68), L: 79 Writ extension: R: 40(55), L: 5(18) RD: R: 14(24), L: 20(28) UD: R: 18(24), L: 8(18) Thumb radial abduction: R: 20(25), L: 8(20) Digit flexion to the [name]: R:  2nd: 4.5 cm(4 cm), 3rd: 6.5cm(3cm), 4th: 7 cm (5cm), 5th: 5cm(5cm) L: 2nd: 8cm(8cm), 3rd: 8cm(7cm), 4th: 7.5cm(7cm), 5th: 7cm(7cm)   07/05/2022:   Shoulder flexion: R: 125(154), L: 160(160) Shoulder abduction: R: 95(130), L: 143(143) Elbow: R: 0-148, L: 0-146 Wrist flexion: R: 60(68),  L: 79 Writ extension: R: 40(60), L: 0(12) RD: R: 10(24), L: 12(20) UD: R: 16(24), L: 8(16) Thumb radial abduction: R: 20(25), L: 8(20) Digit flexion to the [facility]: R:  2nd: 4 cm(4cm), 3rd: 6.5cm( 3.5cm), 4th: 7 cm (5cm), 5th: 6cm(5cm) L: 2nd: 8cm(8cm), 3rd: 8cm(7cm), 4th: 8cm(7cm), 5th: 7cm(7cm)   09/13/2022:   Shoulder flexion: R: 125(154), L: 160(160) Shoulder abduction: R: 100(130), L: 143(143) Elbow: R: 0-150, L: 0-150 Wrist flexion: R: 55(68), L: 79 Writ extension: R: 45(60), L: 0(12) RD: R: 10(24), L: 12(20) UD: R: 16(24), L: 8(16) Thumb radial abduction: R: 20(25), L: 8(20) Digit flexion to the [name]: R:  2nd: 4 cm(4cm), 3rd: 6cm( 5cm), 4th: 6 cm (4cm), 5th: 5cm(5cm) L: 2nd: 7cm(cm), 3rd: 7cm(6cm), 4th: 7cm(6cm), 5th: 6cm(6cm)   10/27/2022   Shoulder flexion: R: 128(154), L: 160(160) Shoulder abduction: R: 105(130), L: 143(143) Elbow: R: 0-150, L: 0-150 Wrist flexion: R: 55(68), L: 79 Writ extension: R: 46(64), L: 0(20) RD: R: 10(20), L: 18(24) UD: R: 118(28), L: 8(20) Thumb radial abduction: R: 30(42), L:20(24) Digit flexion to the [name]: R:  2nd: 4 cm(3cm), 3rd: 6cm( 6cm), 4th: 6 cm (5cm), 5th: 5cm(5cm) L: 2nd: 8cm(8cm), 3rd: 8cm(6cm), 4th: 8cm(7cm), 5th: 6cm(6cm)  COORDINATION:  Impaired 2/2 bilateral hand digit MP, PIP, and DIP extension tightness  SENSATION: Intact  EDEMA: N/A   COGNITION: Overall cognitive status: WNL   VISION: Subjective report: Wears glasses, No changes in vision.  PERCEPTION: Intact  TODAY'S TREATMENT:                                                                                                                              DATE: 01/03/2023  Manual Therapy: Moist heat modality alternated between R/L hands and R/L shoulders throughout session, used for increasing muscle relaxation to maximize tolerance for PROM and end range stretch. Pt. tolerated soft tissue massage to the volar and dorsal surface of bilat wrists, each digit on the  bilateral hands, digit lateral and sagittal bands 2/2 to stiffness.  Soft tissue mobilization was performed for carpal, and metacarpal spread stretches. Manual therapy was performed independent of and in preparation for therapeutic exercises.    Therapeutic Exercise:  Pt. tolerated passive stretch for bilateral wrist extension and flex, PROM for bilateral digit MP, PIP, and DIP flexion and extension, and thumb radial and palmar abduction.  Performed passive stretching to all shoulder planes to tolerance in order to maximize end range for UB ADLs.  Completed passive R/L scapular mobility for shoulder depression and retraction.  Vc for sustained L head turn during R shoulder depression, and R head turn during L shoulder depression to increase tissue extensibility at the neck and upper traps.   PATIENT EDUCATION: Education details: Bilateral hand function, ROM, and strengthening Person educated: Patient Education method: Explanation, Demonstration, and Verbal cues Education comprehension: verbalized understanding and returned demonstration  HOME EXERCISE PROGRAM: Continue to assess HEP needs, and provide as needed.   GOALS: Goals reviewed with patient? Yes  LONG TERM GOALS: Target date: 01/19/2023  3.  Patient will demonstrate improved composite finger flexion to be able to firmly hold and use adaptive devices during ADLs, and IADLs.  Baseline: 12/06/2022: Pt. continues to present with increased MP, PIP, and DIP digit tightness/stiffness limiting the formulation of bilateral composites fists. 10/27/2022: Pt. presents with increased MP, PIP, and DIP digit tightness/stiffness limiting the formulation of bilateral composites fists during this progress reporting period limiting. Digit flexion to the [place] [name]: R:  2nd: 4 cm(3cm), 3rd: 6cm( 6cm), 4th: 6 cm (5cm), 5th: 5cm(5cm), L: 2nd: 8cm(8cm), 3rd: 8cm(6cm), 4th: 8cm(7cm), 5th: 6cm(6cm) 09/20/2022: Improving digit flexion to the [name]. 09/13/22: improved digit  flexion to the [facility]. 07/05/2022: Pt. presents with digit MP, PIP, and DIP extensor tightness limiting her ability to securely grip objects in her bilateral hands.  04/07/2022: Pt. Has improved with right 2nd, and 3rd digit flexion towards the [name]. Pt. Continues to have difficulty securely holding and applying deodorant.02/03/2022: Pt. Has improved with digit flexion, however, continues to have difficulty securely holding and using deodorant. 12/09/2021: Bilateral hand/digit MP, PIP, and DIP extension tightness limits her ability achieve digit flexion to hold, and apply deodorant. 10/28/2021:  Pt. continues to  have difficulty holding the deodorant. 01/12/2021: Pt. Presents with limited digit extension. Pt. Is able to initiate holding deodorant, however is unable to hold it while using it  Goal status: Ongoing   4.  Pt. Will improve bilateral wrist extension in preparation for anticipating, and initiating reaching for objects at the table.  Baseline: 12/06/2022: Pt continues to to progress with bilateral wrist extension in preparation fro functional reaching. 10/27/2022: Pt. Is improving with bilateral wrist extension. R: 46(64) L: 0(20) 09/20/2022: limited PROM in the Left wrist extension 2/2 tightness/stiffness. 09/13/22: R: 55(68), L: 0(12) 07/19/2022: Bilateral wrist extension in limited. 07/05/2022: Right: 40(60) Left: 0(12) 04/07/2022: Right: 40(55) Left: 5(18) 02/03/2022: Right: 34(55) Left: 3(4) 12/09/2021: Right  34(55)  Left  3(4) 10/28/2021:  Right 22(38), Left 0(15) 09/14/2021:  Right: 17(35), left 2(15)  Goal status: Ongoing  7.  Pt. Will increase bilateral lateral pinch strength by 2 lbs to be able to securely grasp items during ADLs, and IADL tasks.  Baseline: 12/06/2022: Pt. Is able to more securely hold objects during ADLs/IADLs 10/27/2022: Pt. Is improving with holding items with her bilateral thumbs. (Pinch meter out for calibration) 09/20/2022: lateral pinch continues to be limited 09/13/22: R 6.5# L 4#  07/19/2022: TBD 07/05/2022: TBD 07/2022: NT-Pinch meter out for calibration. 02/03/2022: Right: 6#, Left: 4# 12/09/2021: Right: 6#, Left: 4#  Goal status: Ongoing   9.  Pt. will complete plant care with modified independence.  Baseline:12/06/2022: Pt. Is independent with plant care in her husband's room, however is not able to access her plants due to furniture blocking access to them. 10/27/2022: Pt. Is able to water plants that are closer, and within reach. Pt. continues to have difficulty reaching for thorough plant care. 09/20/2022: Pt. Continues to have difficulty reaching for thorough plant care. 09/13/22: continues to report intermittent difficulty 07/19/2022: Pt. Continues to be able to water, and care for some of her plants. Pt. Has more difficulty with plants that are harder to reach. 07/05/2022: pt. Is able to water, and care for some of her plants. Pt. Has more difficulty with plants that are harder to reach. 04/07/2022: Pt. is now able to hold a cup and water her plants.02/03/2022: Pt. has difficulty caring for her plants.  Goal status:Achieved   10.  Pt. will demonstrate adaptive techniques to assist with the efficiency of self-dressing, or morning care tasks.  Baseline: 12/06/2022: Pt. requires assist from staff, as Pt. Reports staff have decreased time. 10/27/2022: Pt. is able to assist with initiating UE dressing. Pt. requires assist from personal, and staff care aides. 09/20/2022: Pt. continues to require assist form personal, and staff care aides. 09/13/22: MODA dressing, reports she is often rushed 07/19/2022: Pt. continues to require assist with self-dressing/morning care tasks. 07/05/2022: Continue with goal. 04/07/2022: Pt. Continues to require assist with the efficiency of self-dressing, and morning care tasks.02/03/2022: Pt. requires assist from caregivers 2/2 time limitations during morning care.  Goal status: Ongoing  11.  Pt. will improve BUE strength to improve ADL, and IADL functioning.   Baseline: 12/06/2022: Continue 10/27/2022: shoulder flexion L 4/5, R 3+/5; shoulder abduction L 4+/5, R 3+/5. elbow flexion B 5/5, elbow extension 4/5 09/20/2022: Pt. Presents with limited BUE strength 09/13/22: shoulder flexion L 4/5, R 3/5; elbow flexion B 5/5, elbow extension 4-/5; shoulder abduction L 4+/5, R 3+/5. 07/19/2022: BUE strength continues to be limited. 07/05/2022: shoulder flexion: right 4-/5, abduction: 3+/5, elbow flexion: right: 5/5, left 5/5, extension: right: 4-/5, left 4-/5, wrist extension: right: 3-/5, left:  2-/5  Goal status: Ongoing  12.  Pt. will improve bilateral thumb radial abduction in order to be able to hold the grab bars while standing with PT  Baseline: 12/06/2022: Pt. Continues to present  with limited thumb abduction, however is improving holding onto the parallel bars.10/27/2022: thumb radial abduction: R: 30(42), L: 20(24  Goal status: Ongoing  13.  Pt. Will be able to securely hold, and stabilize medication bottles at a tabletop surface when opening, and closing them.  Baseline: 12/06/2022: Pt is improving with securely stabilizing medication bottles. 10/27/2022: Pt. Stabilizes bottles against her torso when attempting to open, and close them   Goal status:Ongoing    ASSESSMENT: CLINICAL IMPRESSION: Pt. continues to report no pain, just some increased stiffness in R shoulder this date.  Pt continues to respond well to moist heat for muscle relaxation in order to achieve max end range stretch in BUEs.  Pt. continues to present with tightness at the bilateral thumb webspace, bilateral wrist flexor tightness, as well as digit MP, PIP, and DIP extensor tightness which continues to limit her ability to complete ADL tasks efficiently.  Pt. continues to benefit from working on impoving ROM, and UE strength in order to work towards increasing bilateral hand grasp on objects, and increasing engagement of bilateral hands during ADLs, and IADL tasks.    PERFORMANCE DEFICITS:  in functional skills including ADLs, IADLs, coordination, dexterity, ROM, strength, and UE functional use, cognitive skills including , and psychosocial skills including coping strategies and environmental adaptation.   IMPAIRMENTS: are limiting patient from ADLs, IADLs, and leisure.   CO-MORBIDITIES: may have co-morbidities  that affects occupational performance. Patient will benefit from skilled OT to address above impairments and improve overall function.  MODIFICATION OR ASSISTANCE TO COMPLETE EVALUATION: Min-Moderate modification of tasks or assist with assess necessary to complete an evaluation.  OT OCCUPATIONAL PROFILE AND HISTORY: Detailed assessment: Review of records and additional review of physical, cognitive, psychosocial history related to current functional performance.  CLINICAL DECISION MAKING: Moderate - several treatment options, min-mod task modification necessary  REHAB POTENTIAL: Good for stated goals  PLAN:  OT FREQUENCY 2x's a week  OT DURATION: 12 weeks  PLANNED INTERVENTIONS ADL training, A/E training, UE ther. Ex, Manual therapy, neuromuscular re-education, moist heat modality, Paraffin Bath, Splinting, and  Pt./caregiver education   RECOMMENDED OTHER SERVICES: PT  CONSULTED AND AGREED WITH PLAN OF CARE: Patient  PLAN FOR NEXT SESSION: Continue Treatment as per established POC  Danelle Earthly, MS, OTR/L  01/03/2023

## 2023-01-05 ENCOUNTER — Encounter: Payer: Self-pay | Admitting: Occupational Therapy

## 2023-01-05 ENCOUNTER — Ambulatory Visit: Payer: Medicare HMO | Admitting: Occupational Therapy

## 2023-01-05 ENCOUNTER — Ambulatory Visit: Payer: Medicare HMO

## 2023-01-05 DIAGNOSIS — S72001A Fracture of unspecified part of neck of right femur, initial encounter for closed fracture: Secondary | ICD-10-CM

## 2023-01-05 DIAGNOSIS — R262 Difficulty in walking, not elsewhere classified: Secondary | ICD-10-CM

## 2023-01-05 DIAGNOSIS — R269 Unspecified abnormalities of gait and mobility: Secondary | ICD-10-CM

## 2023-01-05 DIAGNOSIS — R278 Other lack of coordination: Secondary | ICD-10-CM

## 2023-01-05 DIAGNOSIS — M6281 Muscle weakness (generalized): Secondary | ICD-10-CM

## 2023-01-05 DIAGNOSIS — S14129S Central cord syndrome at unspecified level of cervical spinal cord, sequela: Secondary | ICD-10-CM

## 2023-01-05 DIAGNOSIS — R2689 Other abnormalities of gait and mobility: Secondary | ICD-10-CM

## 2023-01-05 NOTE — Therapy (Signed)
OUTPATIENT PHYSICAL THERAPY NEURO TREATMENT/ Physical Therapy Progress Note   Dates of reporting period  11/24/2022   to   01/05/2023    Patient Name: Sherry [name] MRN: [id number] DOB:15-May-1934, 88 y.o., female Today's Date: 01/05/2023   PCP: Earnestine Mealing, MD REFERRING PROVIDER: Deeann Saint   PT End of Session - 01/05/23 1322     Visit Number 90    Number of Visits 112    Date for PT Re-Evaluation 03/23/23    Authorization Type [employer] FOTO performed by PT on eval (7/20), score 12, Progress note on 03/10/2020; PN on 08/20/2020    Authorization Time Period Initial cert 9/52/8413-24/40/1027; Recert 01/11/2022-04/05/2022; Recert 04/07/2022-06/30/2022    Progress Note Due on Visit 90    PT Start Time 1148    PT Stop Time 1222    PT Time Calculation (min) 34 min    Equipment Utilized During Treatment Gait belt    Activity Tolerance Patient tolerated treatment well;No increased pain;Patient limited by fatigue    Behavior During Therapy [facility] for tasks assessed/performed                         Past Medical History:  Diagnosis Date   Acute blood loss anemia    Arthritis    Cancer (HCC)    skin   Central cord syndrome at C6 level of cervical spinal cord (HCC) 11/29/2017   Hypertension    Protein-calorie malnutrition, severe (HCC) 01/24/2018   S/P insertion of IVC (inferior vena caval) filter 01/24/2018   Tetraparesis (HCC)    Past Surgical History:  Procedure Laterality Date   ANTERIOR CERVICAL DECOMP/DISCECTOMY FUSION N/A 11/29/2017   Procedure: Cervical five-six, six-seven Anterior Cervical Decompression Fusion;  Surgeon: Jadene Pierini, MD;  Location: [facility] OR;  Service: Neurosurgery;  Laterality: N/A;  Cervical five-six, six-seven Anterior Cervical Decompression Fusion   CATARACT EXTRACTION     FEMUR IM NAIL Left 08/16/2021   Procedure: INTRAMEDULLARY (IM) NAIL FEMORAL;  Surgeon: Deeann Saint, MD;  Location: [facility] ORS;  Service: Orthopedics;   Laterality: Left;   IR IVC FILTER PLMT / S&I /IMG GUID/MOD SED  12/08/2017   Patient Active Problem List   Diagnosis Date Noted   Age-related osteoporosis without current pathological fracture 07/27/2022   Mild recurrent major depression (HCC) 07/27/2022   Hypertensive heart disease with other congestive heart failure (HCC) 02/14/2022   Chronic indwelling Foley catheter 02/14/2022   Closed hip fracture (HCC) 08/15/2021   DVT (deep venous thrombosis) (HCC) 08/15/2021   Quadriplegia (HCC) 08/15/2021   Fall 08/15/2021   Constipation due to slow transit 08/31/2018   Trauma 06/05/2018   Neuropathic pain 06/05/2018   Neurogenic bowel 06/05/2018   Vaginal yeast infection 01/30/2018   Healthcare-associated pneumonia 01/25/2018   Chronic allergic rhinitis 01/24/2018   Depression with anxiety 01/24/2018   UTI due to Klebsiella species 01/24/2018   Protein-calorie malnutrition, severe (HCC) 01/24/2018   S/P insertion of IVC (inferior vena caval) filter 01/24/2018   Tetraparesis (HCC) 01/20/2018   Neurogenic bladder 01/20/2018   Reactive depression    Benign essential HTN    Acute postoperative anemia due to expected blood loss    Central cord syndrome at C6 level of cervical spinal cord (HCC) 11/29/2017   Allergy to alpha-gal 11/25/2016   SCC (squamous cell carcinoma) 04/08/2014    ONSET DATE: 08/15/2021 (fall with B hip fx); Initial injury was in 2019- quadraplegia due to central cord syndrome of C6 secondary to neck  fracture.   REFERRING DIAG: Bilateral Hip fx; Left Open- s/p Left IM nail femoral on 08/16/2021; Right closed  THERAPY DIAG:  Muscle weakness (generalized)  Central cord syndrome, sequela (HCC)  Other lack of coordination  Other abnormalities of gait and mobility  Difficulty in walking, not elsewhere classified  Closed fracture of both hips, initial encounter (HCC)  Abnormality of gait and mobility  Rationale for Evaluation and Treatment  Rehabilitation  SUBJECTIVE:   SUBJECTIVE STATEMENT:  Patient reports pleased with her improving strength and her performance last visit. She states eager to try to continue moving forward.      Pt accompanied by:  Paid caregiver-   PERTINENT HISTORY: Yarnell Daugherty is an 87yoF who experienced a fall at home on 08/15/2021 with hip fracture, s/p Left hip ORIF. PMH: quadriplegia due to central cord syndrome of C6 secondary to neck fracture, HTN, depression with anxiety, neurogenic bladder, bilateral DVT on Xarelto (s/p of IVC filter placement). Prior history of significant PT/OT with improvement of function. Pt has full use of shoulder/elbows, limited use of hands, adaptive self feeding sucessful.     PAIN:  None current but does endorse some left thigh pain intermittent  OBJECTIVE:    TODAY'S TREATMENT:  01/05/23     Therapeutic activities:   Sit to stand from power chair in // bars- Max assist with patient holding onto bars - some difficulty achieving erect standing- Shifting weight over to left LE more today. - x 6 trials total- all with max assist and VC only to look ahead. She exhibited good overall positioning requiring only help to flex left knee.    Stand in // bars- initially static x 1:47 and 33 sec- then progressed to some dynamic weight shifting x 2 next trials, then attempted to march- Experienced some left knee buckling  and last couple of stand- just static with some report of left knee pain so terminated standing.      THEREX:   -Active Assisted Seated marches- 10 reps each LE -Seated LAQ 2x15 each Right LE only today with eccentric control                 PATIENT EDUCATION: Education details: cues for isolated joint exercises  HOME EXERCISE PROGRAM: No changes at this time  Access Code: [facility] URL: [email or web address] Date: 11/23/2021 Prepared by: Precious Bard Exercises - Seated Gluteal Sets - 2 x daily - 7 x weekly - 2  sets - 10 reps - 5 hold - Seated Quad Set - 2 x daily - 7 x weekly - 2 sets - 10 reps - 5 hold - Seated Long Arc Quad - 2 x daily - 7 x weekly - 2 sets - 10 reps - 5 hold - Seated March - 2 x daily - 7 x weekly - 2 sets - 10 reps - 5 hold - Seated Hip Abduction - 2 x daily - 7 x weekly - 2 sets - 10 reps - 5 hold - Seated Shoulder Shrugs - 2 x daily - 7 x weekly - 2 sets - 10 reps - 5 hold - Wheelchair Pressure Relief - 2 x daily - 7 x weekly - 2 sets - 10 reps - 5 hold  GOALS: Goals reviewed with patient? Yes  SHORT TERM GOALS: Target date: 02/22/2022  Pt will be independent with initial UE strengthening HEP in order to improve strength and balance in order to decrease fall risk and improve function at home and work. Baseline: 10/19/2021- patient  with no formal UE HEP; 12/02/2021= Patient verbalized knowledge of HEP including use of theraband for UE strengthening.  Goal status: GOAL MET  LONG TERM GOALS: Target date: 03/23/2023  Pt will be independent with final for UE/LE HEP in order to improve strength and balance in order to decrease fall risk and improve function at home and work. Baseline: Patient is currently BLE NWB and unable to participate in HEP. Has order for UE strengthening. 01/11/2022- Patient now able to participate in LE strengthening although NWB still- good understanding for some basic exercises. Will keep goal active to incorporate progressive LE strengthening exercises. 04/07/2022= Patient still participating in progressive LE seated HEP and has no questions at this time.  Goal status: MET  2.  Pt will improve FOTO to target score of 35  to display perceived improvements in ability to complete ADL's.  Baseline: 10/19/2021= 12; 12/02/2021= 17; 01/11/2022=17; 04/07/2022= 17; 07/05/2022=17; 10/06/2022- outcome measure not appropriate as patient in non-ambulatory and not consistently standing. Goal status: Goal not appropriate  3.  Pt will increase strength of B UE  by at least 1/2 MMT grade  in order to demonstrate improvement in strength and function  Baseline: patient has range of 2-/5 to 4/5 BUE Strength; 12/02/2021= 4/5 except with wrist ext. 01/11/2022= 4/5 B UE strength expept for wrist Goal status: Goal revised-no longer appropriate- working with OT on all UE strengthening.   4. Pt. Will increase strength of RLE by at least 1/2 MMT grade in order to demonstrate improvement in Standing/transfers.  Baseline: 2-/5 Right hip flex/knee ext/flex; 04/07/2022= 2- with right hip flex/knee ext (lacking 28 deg from zero) 4/8= Patient able to ext right knee lacking 18 deg from zero. 07/05/2022=Left knee approx 8 deg from zero and right knee = 23 deg from zero; 12/29/2022 - Left LE = full ROM 4-/5 and right knee ext= 23 deg from zero= 2+/5 (gravity minimized)   Goal status: Progressing  5. Pt. Will demo ability to stand pivot transfer with max assist for improved functional mobility and less dependent need on mechanical device.   Baseline: Dependent on hoyer lift for all transfers; 04/07/2022- Unable to test secondary to patient with recent UTI and right hand procedure - will attempt to reassess next visit. ; 04/12/22: Unable to successfully stand due to feet plates on loaned power w/c; 05/10/2022- Patient still having to use rental chair and has not received her original power w/c back to practice SPT. 07/04/2021= Patient just received new power w/c and attempted standing from 1st time today- Patient able to stand with max assist today from w/c and did not attempt to perform pivot transfer- difficulty with static standing and placing weight on right LE. 10/06/2022- Not assessed today- patient needs new AFO prior to attempting transfers; 12/29/2022= Patient has not attempted SPT yet - still trying to improve LE strength enough to stand well without knee buckling with weight shifting.   Goal status: ONGOING  6. Pt. Will demonstrate improved functional LE strength as seen by ability to stand > 2 min for improved  transfer ability and pregait abilities.   Baseline: not assessed today due to recent dx: UTI; 04/07/2022- Unable to test secondary to patient with recent UTI and right hand procedure - will attempt to reassess next visit; 04/12/22: Unable to successfully stand due to feet plates on loaned power w/c 05/10/2022- Patient still having to use rental chair and has not received her original power w/c back to practice Standing. 07/05/2022- Patient able to stand  with max assist at trunk today for 2 min 3 sec. Will keep goal active to ensure consistency. 10/06/2022= Unable to assess today- patient with some recent blood pressure issues and needs new AFO. 11/24/2022= Patient demonstrated multiple rounds of standing in // bars- holding up to 1:45 min today but has been able to exhibit 2 min in recent past- working on consistency. 12/29/2022= 2 min 40 sec with patient demonstrating improved overall posture and ability to contract glutes and quads with just min assist at trunk today.   Goal status: MET  7. Pt. Will demonstrate improved functional LE strength as seen by ability to stand > 5 min for improved transfer ability and pregait abilities. Baseline: 12/29/2022= 2 min 40 sec with patient demonstrating improved overall posture and ability to contract glutes and quads with just min assist at trunk today.  Goal Status: NEW  8. Patient will perform sit to stand transfer with moderate assist consistently > 75% of time for improved transfer ability and less dependence on caregiver.   Baseline: 12/29/2022- Patient currently max assist level assistance with all sit to stand activities.   Goal status: New   ASSESSMENT:  CLINICAL IMPRESSION: Patient continues to present with good motivation for standing activities. Able to stand more reps yet overall left LE became very fatigued and increased difficulty standing on last 2 trials with increased left knee hyperextension. She was able to attempt some dynamic lateral weight  shifting with assist today but fatigue continues to be most limiting factor.   Pt will continue to benefit from skilled PT services to optimize independence and reduced caregiver support.    OBJECTIVE IMPAIRMENTS Abnormal gait, decreased activity tolerance, decreased balance, decreased coordination, decreased endurance, decreased mobility, difficulty walking, decreased ROM, decreased strength, hypomobility, impaired flexibility, impaired UE functional use, postural dysfunction, and pain.   ACTIVITY LIMITATIONS carrying, lifting, bending, sitting, standing, squatting, sleeping, stairs, transfers, bed mobility, continence, bathing, toileting, dressing, self feeding, reach over head, hygiene/grooming, and caring for others  PARTICIPATION LIMITATIONS: meal prep, cleaning, laundry, medication management, personal finances, interpersonal relationship, driving, shopping, community activity, and yard work  PERSONAL FACTORS Age, Time since onset of injury/illness/exacerbation, and 1-2 comorbidities: HTN, cervical Sx  are also affecting patient's functional outcome.   REHAB POTENTIAL: Good  CLINICAL DECISION MAKING: Evolving/moderate complexity  EVALUATION COMPLEXITY: Moderate  PLAN: PT FREQUENCY: 1-2x/week  PT DURATION: 12 weeks  PLANNED INTERVENTIONS: Therapeutic exercises, Therapeutic activity, Neuromuscular re-education, Balance training, Gait training, Patient/Family education, Self Care, Joint mobilization, DME instructions, Dry Needling, Electrical stimulation, Wheelchair mobility training, Spinal mobilization, Cryotherapy, Moist heat, and Manual therapy  PLAN FOR NEXT SESSION:  LE and core strength training, Sit to stand and static standing; progress to SPT as able.     1:41 PM, 01/05/23 Louis Meckel, PT Physical Therapist - [place] [facility]  Outpatient Physical Therapy- Main Campus [phone]

## 2023-01-05 NOTE — Therapy (Signed)
Occupational Therapy Neuro Treatment Note   Patient Name: Sherry [name] MRN: [id number] DOB:[birth year], 88 y.o., female Today's Date: 11/29/2022  PCP:  Cindi Carbon PROVIDER: Earnestine Mealing, MD  END OF SESSION:   OT End of Session - 01/05/23 1057     Visit Number 97    Number of Visits 120    Date for OT Re-Evaluation 01/19/23    Authorization Time Period Progress report period starting 0812/2024    Progress Note Due on Visit 10    OT Start Time 1100    OT Stop Time 1145    OT Time Calculation (min) 45 min    Equipment Utilized During Treatment powered wheelchair    Activity Tolerance Patient tolerated treatment well    Behavior During Therapy WFL for tasks assessed/performed                Past Medical History:  Diagnosis Date   Acute blood loss anemia    Arthritis    Cancer (HCC)    skin   Central cord syndrome at C6 level of cervical spinal cord (HCC) 11/29/2017   Hypertension    Protein-calorie malnutrition, severe (HCC) 01/24/2018   S/P insertion of IVC (inferior vena caval) filter 01/24/2018   Tetraparesis (HCC)    Past Surgical History:  Procedure Laterality Date   ANTERIOR CERVICAL DECOMP/DISCECTOMY FUSION N/A 11/29/2017   Procedure: Cervical five-six, six-seven Anterior Cervical Decompression Fusion;  Surgeon: Jadene Pierini, MD;  Location: [facility] OR;  Service: Neurosurgery;  Laterality: N/A;  Cervical five-six, six-seven Anterior Cervical Decompression Fusion   CATARACT EXTRACTION     FEMUR IM NAIL Left 08/16/2021   Procedure: INTRAMEDULLARY (IM) NAIL FEMORAL;  Surgeon: Deeann Saint, MD;  Location: [facility] ORS;  Service: Orthopedics;  Laterality: Left;   IR IVC FILTER PLMT / S&I /IMG GUID/MOD SED  12/08/2017   Patient Active Problem List   Diagnosis Date Noted   Age-related osteoporosis without current pathological fracture 07/27/2022   Mild recurrent major depression (HCC) 07/27/2022   Hypertensive heart disease with other congestive heart  failure (HCC) 02/14/2022   Chronic indwelling Foley catheter 02/14/2022   Closed hip fracture (HCC) 08/15/2021   DVT (deep venous thrombosis) (HCC) 08/15/2021   Quadriplegia (HCC) 08/15/2021   Fall 08/15/2021   Constipation due to slow transit 08/31/2018   Trauma 06/05/2018   Neuropathic pain 06/05/2018   Neurogenic bowel 06/05/2018   Vaginal yeast infection 01/30/2018   Healthcare-associated pneumonia 01/25/2018   Chronic allergic rhinitis 01/24/2018   Depression with anxiety 01/24/2018   UTI due to Klebsiella species 01/24/2018   Protein-calorie malnutrition, severe (HCC) 01/24/2018   S/P insertion of IVC (inferior vena caval) filter 01/24/2018   Tetraparesis (HCC) 01/20/2018   Neurogenic bladder 01/20/2018   Reactive depression    Benign essential HTN    Acute postoperative anemia due to expected blood loss    Central cord syndrome at C6 level of cervical spinal cord (HCC) 11/29/2017   Allergy to alpha-gal 11/25/2016   SCC (squamous cell carcinoma) 04/08/2014   ONSET DATE: 11/29/2017  REFERRING DIAG: Central Cord Syndrome at C6 of the Cervical Spinal Cord, Fall with Bilateral Closed Hip Fractures, with ORIF repair with Intramedullary Nailing of the Left Hip Fracture.   THERAPY DIAG:  Muscle weakness (generalized)  Other lack of coordination  Rationale for Evaluation and Treatment: Rehabilitation  SUBJECTIVE:   SUBJECTIVE STATEMENT: Pt reports she is dreading the cold weather.   Pt accompanied by: self, Personal Care aide  PERTINENT HISTORY: Central Cord  Syndrome at C6 of the Cervical Spinal Cord, Fall with Bilateral Closed Hip Fractures, with ORIF repair with Intramedullary Nailing of the Left Hip Fracture.   PRECAUTIONS: None  WEIGHT BEARING RESTRICTIONS: No  PAIN:  Are you having pain? No pain, just shoulder stiffness   LIVING ENVIRONMENT: Lives with: [facility]  Stairs: No Has following equipment at home: Wheelchair (power)  PLOF:  Independent   PATIENT GOALS:  See below for established goals  OBJECTIVE:  Note: Objective measures were completed at Evaluation unless otherwise noted.  HAND DOMINANCE: Right  ADLs: Caregiver assist with ADLs, and [facility]  MOBILITY STATUS: Uses a power w/c  ACTIVITY TOLERANCE: Activity tolerance: Fair  FUNCTIONAL OUTCOME MEASURES:  Measurements:   12/09/2021:   Shoulder flexion: R: 120(136), L: 138(150) Shoulder abduction: R: 108(120), L: 110(120) Elbow: R: 0-148, L: 0-146 Wrist flexion: R: 55(62), L: 78 Writ extension: R: 34(55), L: 3(4) RD: R: 12(20), L: 16(26) UD: R: 8(24), L: 6(18) Thumb radial abduction: R: 11(20), L: 8(20) Digit flexion to the [facility]: R:  2nd: 5cm(3.5cm), 3rd: 7.5 cm(5cm), 4th: 7cm(3cm), 5th: 6cm(5.5cm) L: 2nd: 8cm(8cm), 3rd: 8cm(8cm), 4th: 7.5cm(7cm), 5th: 7cm(7cm)   02/03/2022:   Shoulder flexion: R: 122(138), L: 148(154) Shoulder abduction: R: 109(125), L: 125(125 Elbow: R: 0-148, L: 0-146 Wrist flexion: R: 60(68), L: 79 Writ extension: R: 40(55), L: 3(10) RD: R: 14(24), L: 20(28) UD: R: 8(24), L: 6(18) Thumb radial abduction: R: 20(25), L: 8(20) Digit flexion to the [facility]: R:  2nd: 4.5cm(3cm), 3rd: 6.5cm(4.5cm), 4th: 6.5 cm(3cm), 5th: 4.5cm(5cm) L: 2nd: 8cm(8cm), 3rd: 8cm(7cm), 4th: 7.5cm(7cm), 5th: 6.5cm(6.5cm)   04/07/2022:   Shoulder flexion: R: 125(150), L: 160(160) Shoulder abduction: R: 109(125), L: 125(125 Elbow: R: 0-148, L: 0-146 Wrist flexion: R: 60(68), L: 79 Writ extension: R: 40(55), L: 5(18) RD: R: 14(24), L: 20(28) UD: R: 18(24), L: 8(18) Thumb radial abduction: R: 20(25), L: 8(20) Digit flexion to the [facility]: R:  2nd: 4.5 cm(4 cm), 3rd: 6.5cm(3cm), 4th: 7 cm (5cm), 5th: 5cm(5cm) L: 2nd: 8cm(8cm), 3rd: 8cm(7cm), 4th: 7.5cm(7cm), 5th: 7cm(7cm)   07/05/2022:   Shoulder flexion: R: 125(154), L: 160(160) Shoulder abduction: R: 95(130), L: 143(143) Elbow: R: 0-148, L: 0-146 Wrist flexion: R: 60(68), L: 79 Writ  extension: R: 40(60), L: 0(12) RD: R: 10(24), L: 12(20) UD: R: 16(24), L: 8(16) Thumb radial abduction: R: 20(25), L: 8(20) Digit flexion to the [facility]: R:  2nd: 4 cm(4cm), 3rd: 6.5cm( 3.5cm), 4th: 7 cm (5cm), 5th: 6cm(5cm) L: 2nd: 8cm(8cm), 3rd: 8cm(7cm), 4th: 8cm(7cm), 5th: 7cm(7cm)   09/13/2022:   Shoulder flexion: R: 125(154), L: 160(160) Shoulder abduction: R: 100(130), L: 143(143) Elbow: R: 0-150, L: 0-150 Wrist flexion: R: 55(68), L: 79 Writ extension: R: 45(60), L: 0(12) RD: R: 10(24), L: 12(20) UD: R: 16(24), L: 8(16) Thumb radial abduction: R: 20(25), L: 8(20) Digit flexion to the [facility]: R:  2nd: 4 cm(4cm), 3rd: 6cm( 5cm), 4th: 6 cm (4cm), 5th: 5cm(5cm) L: 2nd: 7cm(cm), 3rd: 7cm(6cm), 4th: 7cm(6cm), 5th: 6cm(6cm)   10/27/2022   Shoulder flexion: R: 128(154), L: 160(160) Shoulder abduction: R: 105(130), L: 143(143) Elbow: R: 0-150, L: 0-150 Wrist flexion: R: 55(68), L: 79 Writ extension: R: 46(64), L: 0(20) RD: R: 10(20), L: 18(24) UD: R: 118(28), L: 8(20) Thumb radial abduction: R: 30(42), L:20(24) Digit flexion to the [facility]: R:  2nd: 4 cm(3cm), 3rd: 6cm( 6cm), 4th: 6 cm (5cm), 5th: 5cm(5cm) L: 2nd: 8cm(8cm), 3rd: 8cm(6cm), 4th: 8cm(7cm), 5th: 6cm(6cm)  COORDINATION:  Impaired 2/2  bilateral hand digit MP, PIP, and DIP extension tightness  SENSATION: Intact  EDEMA: N/A   COGNITION: Overall cognitive status: WNL   VISION: Subjective report: Wears glasses, No changes in vision.  PERCEPTION: Intact  TODAY'S TREATMENT:                                                                                                                              DATE: 01/05/2023    Therapeutic Exercise:  Pt. tolerated passive stretch for bilateral wrist extension and flex, PROM for bilateral digit MP, PIP, and DIP flexion and extension, and thumb radial and palmar abduction.  Performed passive stretching to all shoulder planes to tolerance in order to maximize end range for UB ADLs.  Completed AROM horizontal abduction with full elbow extension/flexion and shoulder flexion with elbow flexion to reach behind head: 1 set x 20 reps each side. Cues to maintain proper form.   Therapeutic Activity:  Pt. worked on reaching for [employer], and moving them through first 3 levels of horizontal rungs placed at progressively higher heights. Then completed with rungs placed vertically. Attempted to grasp balls instead of ring portion but unable to functionally grasp.    PATIENT EDUCATION: Education details: Bilateral hand function, ROM, and strengthening Person educated: Patient Education method: Explanation, Demonstration, and Verbal cues Education comprehension: verbalized understanding and returned demonstration  HOME EXERCISE PROGRAM: Continue to assess HEP needs, and provide as needed.   GOALS: Goals reviewed with patient? Yes  LONG TERM GOALS: Target date: 01/19/2023  3.  Patient will demonstrate improved composite finger flexion to be able to firmly hold and use adaptive devices during ADLs, and IADLs.  Baseline: 12/06/2022: Pt. continues to present with increased MP, PIP, and DIP digit tightness/stiffness limiting the formulation of bilateral composites fists. 10/27/2022: Pt. presents with increased MP, PIP, and DIP digit tightness/stiffness limiting the formulation of bilateral composites fists during this progress reporting period limiting. Digit flexion to the [facility]: R:  2nd: 4 cm(3cm), 3rd: 6cm( 6cm), 4th: 6 cm (5cm), 5th: 5cm(5cm), L: 2nd: 8cm(8cm), 3rd: 8cm(6cm), 4th: 8cm(7cm), 5th: 6cm(6cm) 09/20/2022: Improving digit flexion to the [facility] 09/13/22: improved digit flexion to the [facility] 07/05/2022: Pt. presents with digit MP, PIP, and DIP extensor tightness limiting her ability to securely grip objects in her bilateral hands.  04/07/2022: Pt. Has improved with right 2nd, and 3rd digit flexion towards the [facility]. Pt. Continues to have difficulty securely holding and applying  deodorant.02/03/2022: Pt. Has improved with digit flexion, however, continues to have difficulty securely holding and using deodorant. 12/09/2021: Bilateral hand/digit MP, PIP, and DIP extension tightness limits her ability achieve digit flexion to hold, and apply deodorant. 10/28/2021:  Pt. continues to have difficulty holding the deodorant. 01/12/2021: Pt. Presents with limited digit extension. Pt. Is able to initiate holding deodorant, however is unable to hold it while using it  Goal status: Ongoing   4.  Pt. Will improve bilateral wrist extension in preparation for anticipating,  and initiating reaching for objects at the table.  Baseline: 12/06/2022: Pt continues to to progress with bilateral wrist extension in preparation fro functional reaching. 10/27/2022: Pt. Is improving with bilateral wrist extension. R: 46(64) L: 0(20) 09/20/2022: limited PROM in the Left wrist extension 2/2 tightness/stiffness. 09/13/22: R: 55(68), L: 0(12) 07/19/2022: Bilateral wrist extension in limited. 07/05/2022: Right: 40(60) Left: 0(12) 04/07/2022: Right: 40(55) Left: 5(18) 02/03/2022: Right: 34(55) Left: 3(4) 12/09/2021: Right  34(55)  Left  3(4) 10/28/2021:  Right 22(38), Left 0(15) 09/14/2021:  Right: 17(35), left 2(15)  Goal status: Ongoing  7.  Pt. Will increase bilateral lateral pinch strength by 2 lbs to be able to securely grasp items during ADLs, and IADL tasks.  Baseline: 12/06/2022: Pt. Is able to more securely hold objects during ADLs/IADLs 10/27/2022: Pt. Is improving with holding items with her bilateral thumbs. (Pinch meter out for calibration) 09/20/2022: lateral pinch continues to be limited 09/13/22: R 6.5# L 4# 07/19/2022: TBD 07/05/2022: TBD 07/2022: NT-Pinch meter out for calibration. 02/03/2022: Right: 6#, Left: 4# 12/09/2021: Right: 6#, Left: 4#  Goal status: Ongoing   9.  Pt. will complete plant care with modified independence.  Baseline:12/06/2022: Pt. Is independent with plant care in her husband's room,  however is not able to access her plants due to furniture blocking access to them. 10/27/2022: Pt. Is able to water plants that are closer, and within reach. Pt. continues to have difficulty reaching for thorough plant care. 09/20/2022: Pt. Continues to have difficulty reaching for thorough plant care. 09/13/22: continues to report intermittent difficulty 07/19/2022: Pt. Continues to be able to water, and care for some of her plants. Pt. Has more difficulty with plants that are harder to reach. 07/05/2022: pt. Is able to water, and care for some of her plants. Pt. Has more difficulty with plants that are harder to reach. 04/07/2022: Pt. is now able to hold a cup and water her plants.02/03/2022: Pt. has difficulty caring for her plants.  Goal status:Achieved   10.  Pt. will demonstrate adaptive techniques to assist with the efficiency of self-dressing, or morning care tasks.  Baseline: 12/06/2022: Pt. requires assist from staff, as Pt. Reports staff have decreased time. 10/27/2022: Pt. is able to assist with initiating UE dressing. Pt. requires assist from personal, and staff care aides. 09/20/2022: Pt. continues to require assist form personal, and staff care aides. 09/13/22: MODA dressing, reports she is often rushed 07/19/2022: Pt. continues to require assist with self-dressing/morning care tasks. 07/05/2022: Continue with goal. 04/07/2022: Pt. Continues to require assist with the efficiency of self-dressing, and morning care tasks.02/03/2022: Pt. requires assist from caregivers 2/2 time limitations during morning care.  Goal status: Ongoing  11.  Pt. will improve BUE strength to improve ADL, and IADL functioning.  Baseline: 12/06/2022: Continue 10/27/2022: shoulder flexion L 4/5, R 3+/5; shoulder abduction L 4+/5, R 3+/5. elbow flexion B 5/5, elbow extension 4/5 09/20/2022: Pt. Presents with limited BUE strength 09/13/22: shoulder flexion L 4/5, R 3/5; elbow flexion B 5/5, elbow extension 4-/5; shoulder abduction L 4+/5,  R 3+/5. 07/19/2022: BUE strength continues to be limited. 07/05/2022: shoulder flexion: right 4-/5, abduction: 3+/5, elbow flexion: right: 5/5, left 5/5, extension: right: 4-/5, left 4-/5, wrist extension: right: 3-/5, left: 2-/5  Goal status: Ongoing  12.  Pt. will improve bilateral thumb radial abduction in order to be able to hold the grab bars while standing with PT  Baseline: 12/06/2022: Pt. Continues to present  with limited thumb abduction, however is improving holding onto  the parallel bars.10/27/2022: thumb radial abduction: R: 30(42), L: 20(24  Goal status: Ongoing  13.  Pt. Will be able to securely hold, and stabilize medication bottles at a tabletop surface when opening, and closing them.  Baseline: 12/06/2022: Pt is improving with securely stabilizing medication bottles. 10/27/2022: Pt. Stabilizes bottles against her torso when attempting to open, and close them   Goal status:Ongoing    ASSESSMENT: CLINICAL IMPRESSION: Pt. continues to report no pain, just some increased stiffness in R shoulder this date.  Pt continues to respond well to moist heat for muscle relaxation in order to achieve max end range stretch in BUEs.  Pt reports goal of closing doors while backing up independently. Good tolerance of seated AROM exercises and performed [place] rings with increased speed. Pt. continues to present with tightness at the bilateral thumb webspace, bilateral wrist flexor tightness, as well as digit MP, PIP, and DIP extensor tightness which continues to limit her ability to complete ADL tasks efficiently.  Pt. continues to benefit from working on impoving ROM, and UE strength in order to work towards increasing bilateral hand grasp on objects, and increasing engagement of bilateral hands during ADLs, and IADL tasks.    PERFORMANCE DEFICITS: in functional skills including ADLs, IADLs, coordination, dexterity, ROM, strength, and UE functional use, cognitive skills including , and psychosocial skills  including coping strategies and environmental adaptation.   IMPAIRMENTS: are limiting patient from ADLs, IADLs, and leisure.   CO-MORBIDITIES: may have co-morbidities  that affects occupational performance. Patient will benefit from skilled OT to address above impairments and improve overall function.  MODIFICATION OR ASSISTANCE TO COMPLETE EVALUATION: Min-Moderate modification of tasks or assist with assess necessary to complete an evaluation.  OT OCCUPATIONAL PROFILE AND HISTORY: Detailed assessment: Review of records and additional review of physical, cognitive, psychosocial history related to current functional performance.  CLINICAL DECISION MAKING: Moderate - several treatment options, min-mod task modification necessary  REHAB POTENTIAL: Good for stated goals  PLAN:  OT FREQUENCY 2x's a week  OT DURATION: 12 weeks  PLANNED INTERVENTIONS ADL training, A/E training, UE ther. Ex, Manual therapy, neuromuscular re-education, moist heat modality, Paraffin Bath, Splinting, and  Pt./caregiver education   RECOMMENDED OTHER SERVICES: PT  CONSULTED AND AGREED WITH PLAN OF CARE: Patient  PLAN FOR NEXT SESSION: Continue Treatment as per established POC  Kathie Dike, M.S. OTR/L  01/05/23, 10:58 AM  ascom [phone]

## 2023-01-10 ENCOUNTER — Ambulatory Visit: Payer: Medicare HMO

## 2023-01-10 ENCOUNTER — Ambulatory Visit: Payer: Medicare HMO | Admitting: Occupational Therapy

## 2023-01-10 DIAGNOSIS — R262 Difficulty in walking, not elsewhere classified: Secondary | ICD-10-CM

## 2023-01-10 DIAGNOSIS — R278 Other lack of coordination: Secondary | ICD-10-CM

## 2023-01-10 DIAGNOSIS — M6281 Muscle weakness (generalized): Secondary | ICD-10-CM

## 2023-01-10 DIAGNOSIS — S14129S Central cord syndrome at unspecified level of cervical spinal cord, sequela: Secondary | ICD-10-CM

## 2023-01-10 DIAGNOSIS — S72001A Fracture of unspecified part of neck of right femur, initial encounter for closed fracture: Secondary | ICD-10-CM

## 2023-01-10 DIAGNOSIS — R2689 Other abnormalities of gait and mobility: Secondary | ICD-10-CM

## 2023-01-10 NOTE — Therapy (Signed)
Occupational Therapy Neuro Treatment Note   Patient Name: TAKIRAH MURAOKA MRN: [id number] DOB:[birth year], 88 y.o., female Today's Date: 11/29/2022  PCP:  Cindi Carbon PROVIDER: Earnestine Mealing, MD  END OF SESSION:   OT End of Session - 01/10/23 1333     Visit Number [id number]    Date for OT Re-Evaluation 01/19/23    OT Start Time 1100    OT Stop Time 1142    OT Time Calculation (min) 42 min    Behavior During Therapy WFL for tasks assessed/performed                Past Medical History:  Diagnosis Date   Acute blood loss anemia    Arthritis    Cancer (HCC)    skin   Central cord syndrome at C6 level of cervical spinal cord (HCC) 11/29/2017   Hypertension    Protein-calorie malnutrition, severe (HCC) 01/24/2018   S/P insertion of IVC (inferior vena caval) filter 01/24/2018   Tetraparesis (HCC)    Past Surgical History:  Procedure Laterality Date   ANTERIOR CERVICAL DECOMP/DISCECTOMY FUSION N/A 11/29/2017   Procedure: Cervical five-six, six-seven Anterior Cervical Decompression Fusion;  Surgeon: Jadene Pierini, MD;  Location: [facility] OR;  Service: Neurosurgery;  Laterality: N/A;  Cervical five-six, six-seven Anterior Cervical Decompression Fusion   CATARACT EXTRACTION     FEMUR IM NAIL Left 08/16/2021   Procedure: INTRAMEDULLARY (IM) NAIL FEMORAL;  Surgeon: Deeann Saint, MD;  Location: [facility] ORS;  Service: Orthopedics;  Laterality: Left;   IR IVC FILTER PLMT / S&I /IMG GUID/MOD SED  12/08/2017   Patient Active Problem List   Diagnosis Date Noted   Age-related osteoporosis without current pathological fracture 07/27/2022   Mild recurrent major depression (HCC) 07/27/2022   Hypertensive heart disease with other congestive heart failure (HCC) 02/14/2022   Chronic indwelling Foley catheter 02/14/2022   Closed hip fracture (HCC) 08/15/2021   DVT (deep venous thrombosis) (HCC) 08/15/2021   Quadriplegia (HCC) 08/15/2021   Fall 08/15/2021   Constipation due to slow transit  08/31/2018   Trauma 06/05/2018   Neuropathic pain 06/05/2018   Neurogenic bowel 06/05/2018   Vaginal yeast infection 01/30/2018   Healthcare-associated pneumonia 01/25/2018   Chronic allergic rhinitis 01/24/2018   Depression with anxiety 01/24/2018   UTI due to Klebsiella species 01/24/2018   Protein-calorie malnutrition, severe (HCC) 01/24/2018   S/P insertion of IVC (inferior vena caval) filter 01/24/2018   Tetraparesis (HCC) 01/20/2018   Neurogenic bladder 01/20/2018   Reactive depression    Benign essential HTN    Acute postoperative anemia due to expected blood loss    Central cord syndrome at C6 level of cervical spinal cord (HCC) 11/29/2017   Allergy to alpha-gal 11/25/2016   SCC (squamous cell carcinoma) 04/08/2014   ONSET DATE: 11/29/2017  REFERRING DIAG: Central Cord Syndrome at C6 of the Cervical Spinal Cord, Fall with Bilateral Closed Hip Fractures, with ORIF repair with Intramedullary Nailing of the Left Hip Fracture.   THERAPY DIAG:  Muscle weakness (generalized)  Other lack of coordination  Rationale for Evaluation and Treatment: Rehabilitation  SUBJECTIVE:   SUBJECTIVE STATEMENT: Pt reports she is dreading the cold weather.   Pt accompanied by: self, Personal Care aide  PERTINENT HISTORY: Central Cord Syndrome at C6 of the Cervical Spinal Cord, Fall with Bilateral Closed Hip Fractures, with ORIF repair with Intramedullary Nailing of the Left Hip Fracture.   PRECAUTIONS: None  WEIGHT BEARING RESTRICTIONS: No  PAIN:  Are you having pain? No pain, just shoulder  stiffness   LIVING ENVIRONMENT: Lives with: [facility]  Stairs: No Has following equipment at home: Wheelchair (power)  PLOF: Independent   PATIENT GOALS:  See below for established goals  OBJECTIVE:  Note: Objective measures were completed at Evaluation unless otherwise noted.  HAND DOMINANCE: Right  ADLs: Caregiver assist with ADLs, and [facility]  MOBILITY  STATUS: Uses a power w/c  ACTIVITY TOLERANCE: Activity tolerance: Fair  FUNCTIONAL OUTCOME MEASURES:  Measurements:   12/09/2021:   Shoulder flexion: R: 120(136), L: 138(150) Shoulder abduction: R: 108(120), L: 110(120) Elbow: R: 0-148, L: 0-146 Wrist flexion: R: 55(62), L: 78 Writ extension: R: 34(55), L: 3(4) RD: R: 12(20), L: 16(26) UD: R: 8(24), L: 6(18) Thumb radial abduction: R: 11(20), L: 8(20) Digit flexion to the [facility]: R:  2nd: 5cm(3.5cm), 3rd: 7.5 cm(5cm), 4th: 7cm(3cm), 5th: 6cm(5.5cm) L: 2nd: 8cm(8cm), 3rd: 8cm(8cm), 4th: 7.5cm(7cm), 5th: 7cm(7cm)   02/03/2022:   Shoulder flexion: R: 122(138), L: 148(154) Shoulder abduction: R: 109(125), L: 125(125 Elbow: R: 0-148, L: 0-146 Wrist flexion: R: 60(68), L: 79 Writ extension: R: 40(55), L: 3(10) RD: R: 14(24), L: 20(28) UD: R: 8(24), L: 6(18) Thumb radial abduction: R: 20(25), L: 8(20) Digit flexion to the [facility]: R:  2nd: 4.5cm(3cm), 3rd: 6.5cm(4.5cm), 4th: 6.5 cm(3cm), 5th: 4.5cm(5cm) L: 2nd: 8cm(8cm), 3rd: 8cm(7cm), 4th: 7.5cm(7cm), 5th: 6.5cm(6.5cm)   04/07/2022:   Shoulder flexion: R: 125(150), L: 160(160) Shoulder abduction: R: 109(125), L: 125(125 Elbow: R: 0-148, L: 0-146 Wrist flexion: R: 60(68), L: 79 Writ extension: R: 40(55), L: 5(18) RD: R: 14(24), L: 20(28) UD: R: 18(24), L: 8(18) Thumb radial abduction: R: 20(25), L: 8(20) Digit flexion to the [facility]: R:  2nd: 4.5 cm(4 cm), 3rd: 6.5cm(3cm), 4th: 7 cm (5cm), 5th: 5cm(5cm) L: 2nd: 8cm(8cm), 3rd: 8cm(7cm), 4th: 7.5cm(7cm), 5th: 7cm(7cm)   07/05/2022:   Shoulder flexion: R: 125(154), L: 160(160) Shoulder abduction: R: 95(130), L: 143(143) Elbow: R: 0-148, L: 0-146 Wrist flexion: R: 60(68), L: 79 Writ extension: R: 40(60), L: 0(12) RD: R: 10(24), L: 12(20) UD: R: 16(24), L: 8(16) Thumb radial abduction: R: 20(25), L: 8(20) Digit flexion to the [facility]: R:  2nd: 4 cm(4cm), 3rd: 6.5cm( 3.5cm), 4th: 7 cm (5cm), 5th: 6cm(5cm) L: 2nd: 8cm(8cm), 3rd:  8cm(7cm), 4th: 8cm(7cm), 5th: 7cm(7cm)   09/13/2022:   Shoulder flexion: R: 125(154), L: 160(160) Shoulder abduction: R: 100(130), L: 143(143) Elbow: R: 0-150, L: 0-150 Wrist flexion: R: 55(68), L: 79 Writ extension: R: 45(60), L: 0(12) RD: R: 10(24), L: 12(20) UD: R: 16(24), L: 8(16) Thumb radial abduction: R: 20(25), L: 8(20) Digit flexion to the [facility]: R:  2nd: 4 cm(4cm), 3rd: 6cm( 5cm), 4th: 6 cm (4cm), 5th: 5cm(5cm) L: 2nd: 7cm(cm), 3rd: 7cm(6cm), 4th: 7cm(6cm), 5th: 6cm(6cm)   10/27/2022   Shoulder flexion: R: 128(154), L: 160(160) Shoulder abduction: R: 105(130), L: 143(143) Elbow: R: 0-150, L: 0-150 Wrist flexion: R: 55(68), L: 79 Writ extension: R: 46(64), L: 0(20) RD: R: 10(20), L: 18(24) UD: R: 118(28), L: 8(20) Thumb radial abduction: R: 30(42), L:20(24) Digit flexion to the [facility]: R:  2nd: 4 cm(3cm), 3rd: 6cm( 6cm), 4th: 6 cm (5cm), 5th: 5cm(5cm) L: 2nd: 8cm(8cm), 3rd: 8cm(6cm), 4th: 8cm(7cm), 5th: 6cm(6cm)  COORDINATION:  Impaired 2/2 bilateral hand digit MP, PIP, and DIP extension tightness  SENSATION: Intact  EDEMA: N/A   COGNITION: Overall cognitive status: WNL   VISION: Subjective report: Wears glasses, No changes in vision.  PERCEPTION: Intact  TODAY'S TREATMENT:  DATE: 01/10/2023    Manual Therapy:   Pt. tolerated soft tissue massage to the volar, and dorsal surface of each digit on the bilateral hands, digit lateral, and sagittal bands 2/2 to stiffness following moist heat modality. Soft tissue mobilization was performed for carpal, and metacarpal spread stretches. Manual therapy was performed independent of, and in preparation for therapeutic Ex.     Therapeutic Ex.    Pt. tolerated PROM followed by AROM bilateral wrist extension, PROM for bilateral digit MP, PIP, and DIP flexion, and extension, thumb radial, and palmar  abduction. Pt. performed 2.5# dowel ex. for UE strengthening secondary to weakness. Bilateral shoulder flexion, chest press, and circular patterns. Pt. worked on pinch strengthening in the right hand for lateral, and 3pt. pinch using yellow, and red resistive clips.  PATIENT EDUCATION: Education details: Bilateral hand function, ROM, and strengthening Person educated: Patient Education method: Explanation, Demonstration, and Verbal cues Education comprehension: verbalized understanding and returned demonstration  HOME EXERCISE PROGRAM: Continue to assess HEP needs, and provide as needed.   GOALS: Goals reviewed with patient? Yes  LONG TERM GOALS: Target date: 01/19/2023  3.  Patient will demonstrate improved composite finger flexion to be able to firmly hold and use adaptive devices during ADLs, and IADLs.  Baseline: 12/06/2022: Pt. continues to present with increased MP, PIP, and DIP digit tightness/stiffness limiting the formulation of bilateral composites fists. 10/27/2022: Pt. presents with increased MP, PIP, and DIP digit tightness/stiffness limiting the formulation of bilateral composites fists during this progress reporting period limiting. Digit flexion to the [facility] R:  2nd: 4 cm(3cm), 3rd: 6cm( 6cm), 4th: 6 cm (5cm), 5th: 5cm(5cm), L: 2nd: 8cm(8cm), 3rd: 8cm(6cm), 4th: 8cm(7cm), 5th: 6cm(6cm) 09/20/2022: Improving digit flexion to the [facility]. 09/13/22: improved digit flexion to the [facility]. 07/05/2022: Pt. presents with digit MP, PIP, and DIP extensor tightness limiting her ability to securely grip objects in her bilateral hands.  04/07/2022: Pt. Has improved with right 2nd, and 3rd digit flexion towards the [facility]. Pt. Continues to have difficulty securely holding and applying deodorant.02/03/2022: Pt. Has improved with digit flexion, however, continues to have difficulty securely holding and using deodorant. 12/09/2021: Bilateral hand/digit MP, PIP, and DIP extension tightness limits her ability achieve  digit flexion to hold, and apply deodorant. 10/28/2021:  Pt. continues to have difficulty holding the deodorant. 01/12/2021: Pt. Presents with limited digit extension. Pt. Is able to initiate holding deodorant, however is unable to hold it while using it  Goal status: Ongoing   4.  Pt. Will improve bilateral wrist extension in preparation for anticipating, and initiating reaching for objects at the table.  Baseline: 12/06/2022: Pt continues to to progress with bilateral wrist extension in preparation fro functional reaching. 10/27/2022: Pt. Is improving with bilateral wrist extension. R: 46(64) L: 0(20) 09/20/2022: limited PROM in the Left wrist extension 2/2 tightness/stiffness. 09/13/22: R: 55(68), L: 0(12) 07/19/2022: Bilateral wrist extension in limited. 07/05/2022: Right: 40(60) Left: 0(12) 04/07/2022: Right: 40(55) Left: 5(18) 02/03/2022: Right: 34(55) Left: 3(4) 12/09/2021: Right  34(55)  Left  3(4) 10/28/2021:  Right 22(38), Left 0(15) 09/14/2021:  Right: 17(35), left 2(15)  Goal status: Ongoing  7.  Pt. Will increase bilateral lateral pinch strength by 2 lbs to be able to securely grasp items during ADLs, and IADL tasks.  Baseline: 12/06/2022: Pt. Is able to more securely hold objects during ADLs/IADLs 10/27/2022: Pt. Is improving with holding items with her bilateral thumbs. (Pinch meter out for calibration) 09/20/2022: lateral pinch continues to be limited 09/13/22: R 6.5#  L 4# 07/19/2022: TBD 07/05/2022: TBD 07/2022: NT-Pinch meter out for calibration. 02/03/2022: Right: 6#, Left: 4# 12/09/2021: Right: 6#, Left: 4#  Goal status: Ongoing   9.  Pt. will complete plant care with modified independence.  Baseline:12/06/2022: Pt. Is independent with plant care in her husband's room, however is not able to access her plants due to furniture blocking access to them. 10/27/2022: Pt. Is able to water plants that are closer, and within reach. Pt. continues to have difficulty reaching for thorough plant care. 09/20/2022:  Pt. Continues to have difficulty reaching for thorough plant care. 09/13/22: continues to report intermittent difficulty 07/19/2022: Pt. Continues to be able to water, and care for some of her plants. Pt. Has more difficulty with plants that are harder to reach. 07/05/2022: pt. Is able to water, and care for some of her plants. Pt. Has more difficulty with plants that are harder to reach. 04/07/2022: Pt. is now able to hold a cup and water her plants.02/03/2022: Pt. has difficulty caring for her plants.  Goal status:Achieved   10.  Pt. will demonstrate adaptive techniques to assist with the efficiency of self-dressing, or morning care tasks.  Baseline: 12/06/2022: Pt. requires assist from staff, as Pt. Reports staff have decreased time. 10/27/2022: Pt. is able to assist with initiating UE dressing. Pt. requires assist from personal, and staff care aides. 09/20/2022: Pt. continues to require assist form personal, and staff care aides. 09/13/22: MODA dressing, reports she is often rushed 07/19/2022: Pt. continues to require assist with self-dressing/morning care tasks. 07/05/2022: Continue with goal. 04/07/2022: Pt. Continues to require assist with the efficiency of self-dressing, and morning care tasks.02/03/2022: Pt. requires assist from caregivers 2/2 time limitations during morning care.  Goal status: Ongoing  11.  Pt. will improve BUE strength to improve ADL, and IADL functioning.  Baseline: 12/06/2022: Continue 10/27/2022: shoulder flexion L 4/5, R 3+/5; shoulder abduction L 4+/5, R 3+/5. elbow flexion B 5/5, elbow extension 4/5 09/20/2022: Pt. Presents with limited BUE strength 09/13/22: shoulder flexion L 4/5, R 3/5; elbow flexion B 5/5, elbow extension 4-/5; shoulder abduction L 4+/5, R 3+/5. 07/19/2022: BUE strength continues to be limited. 07/05/2022: shoulder flexion: right 4-/5, abduction: 3+/5, elbow flexion: right: 5/5, left 5/5, extension: right: 4-/5, left 4-/5, wrist extension: right: 3-/5, left: 2-/5  Goal  status: Ongoing  12.  Pt. will improve bilateral thumb radial abduction in order to be able to hold the grab bars while standing with PT  Baseline: 12/06/2022: Pt. Continues to present  with limited thumb abduction, however is improving holding onto the parallel bars.10/27/2022: thumb radial abduction: R: 30(42), L: 20(24  Goal status: Ongoing  13.  Pt. Will be able to securely hold, and stabilize medication bottles at a tabletop surface when opening, and closing them.  Baseline: 12/06/2022: Pt is improving with securely stabilizing medication bottles. 10/27/2022: Pt. Stabilizes bottles against her torso when attempting to open, and close them   Goal status:Ongoing    ASSESSMENT: CLINICAL IMPRESSION:  Pt. continues to report no pain. Pt. continues to present with right shoulder stiffness. Pt. reports having had a recent appointment at the vascular clinic, as was told she has Lymphedema in her LEs, and is going to start compression pumps once a day as a result. Pt. is tolerate the exercises well with the dowel weight to 2.5#.  Pt. required assist for hand position, and placement of clips into her hand. Pt. continues to present with tightness at the bilateral thumb webspace, bilateral wrist flexor tightness,  as well as digit MP, PIP, and DIP extensor tightness which continues to limit her ability to complete ADL tasks efficiently. Pt. continues to benefit from working on impoving ROM, and UE strength in order to work towards increasing bilateral hand grasp on objects, and increasing engagement of bilateral hands during ADLs, and IADL tasks.     PERFORMANCE DEFICITS: in functional skills including ADLs, IADLs, coordination, dexterity, ROM, strength, and UE functional use, cognitive skills including , and psychosocial skills including coping strategies and environmental adaptation.   IMPAIRMENTS: are limiting patient from ADLs, IADLs, and leisure.   CO-MORBIDITIES: may have co-morbidities  that  affects occupational performance. Patient will benefit from skilled OT to address above impairments and improve overall function.  MODIFICATION OR ASSISTANCE TO COMPLETE EVALUATION: Min-Moderate modification of tasks or assist with assess necessary to complete an evaluation.  OT OCCUPATIONAL PROFILE AND HISTORY: Detailed assessment: Review of records and additional review of physical, cognitive, psychosocial history related to current functional performance.  CLINICAL DECISION MAKING: Moderate - several treatment options, min-mod task modification necessary  REHAB POTENTIAL: Good for stated goals  PLAN:  OT FREQUENCY 2x's a week  OT DURATION: 12 weeks  PLANNED INTERVENTIONS ADL training, A/E training, UE ther. Ex, Manual therapy, neuromuscular re-education, moist heat modality, Paraffin Bath, Splinting, and  Pt./caregiver education   RECOMMENDED OTHER SERVICES: PT  CONSULTED AND AGREED WITH PLAN OF CARE: Patient  PLAN FOR NEXT SESSION: Continue Treatment as per established POC  Olegario Messier, MS, OTR/L  01/10/2023

## 2023-01-10 NOTE — Therapy (Signed)
OUTPATIENT PHYSICAL THERAPY NEURO TREATMENT  Patient Name: Sherry [name] MRN: [id number] DOB:[birth year], 88 y.o., female Today's Date: 01/10/2023   PCP: Earnestine Mealing, MD REFERRING PROVIDER: Deeann Saint   PT End of Session - 01/10/23 1130     Visit Number 91    Number of Visits 112    Date for PT Re-Evaluation 03/23/23    Authorization Type [employer] FOTO performed by PT on eval (7/20), score 12, Progress note on 03/10/2020; PN on 08/20/2020    Authorization Time Period Initial cert [id number]/9562-13/[id number]/6576; Recert 01/11/2022-04/05/2022; Recert 04/07/2022-06/30/2022    Progress Note Due on Visit 100    PT Start Time 1145    PT Stop Time 1230    PT Time Calculation (min) 45 min    Equipment Utilized During Treatment Gait belt    Activity Tolerance Patient tolerated treatment well;No increased pain;Patient limited by fatigue    Behavior During Therapy [facility] for tasks assessed/performed                          Past Medical History:  Diagnosis Date   Acute blood loss anemia    Arthritis    Cancer (HCC)    skin   Central cord syndrome at C6 level of cervical spinal cord (HCC) 11/29/2017   Hypertension    Protein-calorie malnutrition, severe (HCC) 01/24/2018   S/P insertion of IVC (inferior vena caval) filter 01/24/2018   Tetraparesis (HCC)    Past Surgical History:  Procedure Laterality Date   ANTERIOR CERVICAL DECOMP/DISCECTOMY FUSION N/A 11/29/2017   Procedure: Cervical five-six, six-seven Anterior Cervical Decompression Fusion;  Surgeon: Jadene Pierini, MD;  Location: [facility] OR;  Service: Neurosurgery;  Laterality: N/A;  Cervical five-six, six-seven Anterior Cervical Decompression Fusion   CATARACT EXTRACTION     FEMUR IM NAIL Left 08/16/2021   Procedure: INTRAMEDULLARY (IM) NAIL FEMORAL;  Surgeon: Deeann Saint, MD;  Location: [facility] ORS;  Service: Orthopedics;  Laterality: Left;   IR IVC FILTER PLMT / S&I /IMG GUID/MOD SED  12/08/2017   Patient Active  Problem List   Diagnosis Date Noted   Age-related osteoporosis without current pathological fracture 07/27/2022   Mild recurrent major depression (HCC) 07/27/2022   Hypertensive heart disease with other congestive heart failure (HCC) 02/14/2022   Chronic indwelling Foley catheter 02/14/2022   Closed hip fracture (HCC) 08/15/2021   DVT (deep venous thrombosis) (HCC) 08/15/2021   Quadriplegia (HCC) 08/15/2021   Fall 08/15/2021   Constipation due to slow transit 08/31/2018   Trauma 06/05/2018   Neuropathic pain 06/05/2018   Neurogenic bowel 06/05/2018   Vaginal yeast infection 01/30/2018   Healthcare-associated pneumonia 01/25/2018   Chronic allergic rhinitis 01/24/2018   Depression with anxiety 01/24/2018   UTI due to Klebsiella species 01/24/2018   Protein-calorie malnutrition, severe (HCC) 01/24/2018   S/P insertion of IVC (inferior vena caval) filter 01/24/2018   Tetraparesis (HCC) 01/20/2018   Neurogenic bladder 01/20/2018   Reactive depression    Benign essential HTN    Acute postoperative anemia due to expected blood loss    Central cord syndrome at C6 level of cervical spinal cord (HCC) 11/29/2017   Allergy to alpha-gal 11/25/2016   SCC (squamous cell carcinoma) 04/08/2014    ONSET DATE: 08/15/2021 (fall with B hip fx); Initial injury was in 2019- quadraplegia due to central cord syndrome of C6 secondary to neck fracture.   REFERRING DIAG: Bilateral Hip fx; Left Open- s/p Left IM nail femoral on 08/16/2021; Right closed  THERAPY DIAG:  Muscle weakness (generalized)  Central cord syndrome, sequela (HCC)  Other lack of coordination  Other abnormalities of gait and mobility  Difficulty in walking, not elsewhere classified  Closed fracture of both hips, initial encounter [facility])  Rationale for Evaluation and Treatment Rehabilitation  SUBJECTIVE:   SUBJECTIVE STATEMENT:  Patient reports having mostly uneventful weekend- states doing well without pain.       Pt  accompanied by:  Paid caregiver-   PERTINENT HISTORY: Sherry [name] is an 88yoF who experienced a fall at home on 08/15/2021 with hip fracture, s/p Left hip ORIF. PMH: quadriplegia due to central cord syndrome of C6 secondary to neck fracture, HTN, depression with anxiety, neurogenic bladder, bilateral DVT on Xarelto (s/p of IVC filter placement). Prior history of significant PT/OT with improvement of function. Pt has full use of shoulder/elbows, limited use of hands, adaptive self feeding sucessful.     PAIN:  None current but does endorse some left thigh pain intermittent  OBJECTIVE:    TODAY'S TREATMENT:  01/10/23     Therapeutic activities:   Sit to stand with max assist from Power chair and from [place] of mat x 2 each Stand pivot transfer from power w/c to mat and back- Max assist +1 with feet pre-positioned to angle toward direction of transfer Blaze pod activity as described below:  Activity Description: Random- positioned 6 pods to left and right and center of patient on mat/tray table and in seat of power chair with instruction to "tap" pod with left or right hand Activity Setting:  Random Number of Pods:  6 Cycles/Sets:  5 Focusing on reaction and ability to remain upright working core.     THEREX: all performed at [facility] today to also focus on core control  -Active Seated marches- 2 x 10 reps each LE -Seated LAQ 2x15 each eachLE -Seated thoracic rotation with arms out straight then clap x 12 reps each side.  - Ant/posterior rocking at [facility]- "Nose over toes" then lean back x 15 reps.  - AAROM hip abd each LE x 10 reps                   PATIENT EDUCATION: Education details: cues for isolated joint exercises  HOME EXERCISE PROGRAM: No changes at this time  Access Code: [facility] URL: [email or web address] Date: 11/23/2021 Prepared by: Precious Bard Exercises - Seated Gluteal Sets - 2 x daily - 7 x weekly - 2 sets - 10 reps - 5 hold - Seated Quad  Set - 2 x daily - 7 x weekly - 2 sets - 10 reps - 5 hold - Seated Long Arc Quad - 2 x daily - 7 x weekly - 2 sets - 10 reps - 5 hold - Seated March - 2 x daily - 7 x weekly - 2 sets - 10 reps - 5 hold - Seated Hip Abduction - 2 x daily - 7 x weekly - 2 sets - 10 reps - 5 hold - Seated Shoulder Shrugs - 2 x daily - 7 x weekly - 2 sets - 10 reps - 5 hold - Wheelchair Pressure Relief - 2 x daily - 7 x weekly - 2 sets - 10 reps - 5 hold  GOALS: Goals reviewed with patient? Yes  SHORT TERM GOALS: Target date: 02/22/2022  Pt will be independent with initial UE strengthening HEP in order to improve strength and balance in order to decrease fall risk and improve function at home and work. Baseline: 10/19/2021-  patient with no formal UE HEP; 12/02/2021= Patient verbalized knowledge of HEP including use of theraband for UE strengthening.  Goal status: GOAL MET  LONG TERM GOALS: Target date: 03/23/2023  Pt will be independent with final for UE/LE HEP in order to improve strength and balance in order to decrease fall risk and improve function at home and work. Baseline: Patient is currently BLE NWB and unable to participate in HEP. Has order for UE strengthening. 01/11/2022- Patient now able to participate in LE strengthening although NWB still- good understanding for some basic exercises. Will keep goal active to incorporate progressive LE strengthening exercises. 04/07/2022= Patient still participating in progressive LE seated HEP and has no questions at this time.  Goal status: MET  2.  Pt will improve FOTO to target score of 35  to display perceived improvements in ability to complete ADL's.  Baseline: 10/19/2021= 12; 12/02/2021= 17; 01/11/2022=17; 04/07/2022= 17; 07/05/2022=17; 10/06/2022- outcome measure not appropriate as patient in non-ambulatory and not consistently standing. Goal status: Goal not appropriate  3.  Pt will increase strength of B UE  by at least 1/2 MMT grade in order to demonstrate improvement in  strength and function  Baseline: patient has range of 2-/5 to 4/5 BUE Strength; 12/02/2021= 4/5 except with wrist ext. 01/11/2022= 4/5 B UE strength expept for wrist Goal status: Goal revised-no longer appropriate- working with OT on all UE strengthening.   4. Pt. Will increase strength of RLE by at least 1/2 MMT grade in order to demonstrate improvement in Standing/transfers.  Baseline: 2-/5 Right hip flex/knee ext/flex; 04/07/2022= 2- with right hip flex/knee ext (lacking 28 deg from zero) 4/8= Patient able to ext right knee lacking 18 deg from zero. 07/05/2022=Left knee approx 8 deg from zero and right knee = 23 deg from zero; 12/29/2022 - Left LE = full ROM 4-/5 and right knee ext= 23 deg from zero= 2+/5 (gravity minimized)   Goal status: Progressing  5. Pt. Will demo ability to stand pivot transfer with max assist for improved functional mobility and less dependent need on mechanical device.   Baseline: Dependent on hoyer lift for all transfers; 04/07/2022- Unable to test secondary to patient with recent UTI and right hand procedure - will attempt to reassess next visit. ; 04/12/22: Unable to successfully stand due to feet plates on loaned power w/c; 05/10/2022- Patient still having to use rental chair and has not received her original power w/c back to practice SPT. 07/04/2021= Patient just received new power w/c and attempted standing from 1st time today- Patient able to stand with max assist today from w/c and did not attempt to perform pivot transfer- difficulty with static standing and placing weight on right LE. 10/06/2022- Not assessed today- patient needs new AFO prior to attempting transfers; 12/29/2022= Patient has not attempted SPT yet - still trying to improve LE strength enough to stand well without knee buckling with weight shifting.   Goal status: ONGOING  6. Pt. Will demonstrate improved functional LE strength as seen by ability to stand > 2 min for improved transfer ability and pregait abilities.    Baseline: not assessed today due to recent dx: UTI; 04/07/2022- Unable to test secondary to patient with recent UTI and right hand procedure - will attempt to reassess next visit; 04/12/22: Unable to successfully stand due to feet plates on loaned power w/c 05/10/2022- Patient still having to use rental chair and has not received her original power w/c back to practice Standing. 07/05/2022- Patient able to  stand with max assist at trunk today for 2 min 3 sec. Will keep goal active to ensure consistency. 10/06/2022= Unable to assess today- patient with some recent blood pressure issues and needs new AFO. 11/24/2022= Patient demonstrated multiple rounds of standing in // bars- holding up to 1:45 min today but has been able to exhibit 2 min in recent past- working on consistency. 12/29/2022= 2 min 40 sec with patient demonstrating improved overall posture and ability to contract glutes and quads with just min assist at trunk today.   Goal status: MET  7. Pt. Will demonstrate improved functional LE strength as seen by ability to stand > 5 min for improved transfer ability and pregait abilities. Baseline: 12/29/2022= 2 min 40 sec with patient demonstrating improved overall posture and ability to contract glutes and quads with just min assist at trunk today.  Goal Status: NEW  8. Patient will perform sit to stand transfer with moderate assist consistently > 75% of time for improved transfer ability and less dependence on caregiver.   Baseline: 12/29/2022- Patient currently max assist level assistance with all sit to stand activities.   Goal status: New   ASSESSMENT:  CLINICAL IMPRESSION: Patient performed well again today with SPT- able  move right LE off floor to pivot. She responded well overall- able to stand well again yet knees quick to give way with pivot transfer. She was later fatigued with blaze pod activity focusing on core control with dual task movement. She did also experience some chair issues -  Chair became stuck in reclined position and then did an audible pop with a manual release and PT had to reach for patient to prevent her from falling forward out of chair. After this pop- chair seemed to operate normally and patient able to use her chair to reposition herself into proper seated position. Pt will continue to benefit from skilled PT services to optimize independence and reduced caregiver support.    OBJECTIVE IMPAIRMENTS Abnormal gait, decreased activity tolerance, decreased balance, decreased coordination, decreased endurance, decreased mobility, difficulty walking, decreased ROM, decreased strength, hypomobility, impaired flexibility, impaired UE functional use, postural dysfunction, and pain.   ACTIVITY LIMITATIONS carrying, lifting, bending, sitting, standing, squatting, sleeping, stairs, transfers, bed mobility, continence, bathing, toileting, dressing, self feeding, reach over head, hygiene/grooming, and caring for others  PARTICIPATION LIMITATIONS: meal prep, cleaning, laundry, medication management, personal finances, interpersonal relationship, driving, shopping, community activity, and yard work  PERSONAL FACTORS Age, Time since onset of injury/illness/exacerbation, and 1-2 comorbidities: HTN, cervical Sx  are also affecting patient's functional outcome.   REHAB POTENTIAL: Good  CLINICAL DECISION MAKING: Evolving/moderate complexity  EVALUATION COMPLEXITY: Moderate  PLAN: PT FREQUENCY: 1-2x/week  PT DURATION: 12 weeks  PLANNED INTERVENTIONS: Therapeutic exercises, Therapeutic activity, Neuromuscular re-education, Balance training, Gait training, Patient/Family education, Self Care, Joint mobilization, DME instructions, Dry Needling, Electrical stimulation, Wheelchair mobility training, Spinal mobilization, Cryotherapy, Moist heat, and Manual therapy  PLAN FOR NEXT SESSION:  LE and core strength training, Sit to stand and static standing; progress to SPT as able.      2:18 PM, 01/10/23 Louis Meckel, PT Physical Therapist - [facility]  Outpatient Physical Therapy- Main Campus [phone]

## 2023-01-12 ENCOUNTER — Ambulatory Visit: Payer: Medicare HMO

## 2023-01-12 ENCOUNTER — Ambulatory Visit: Payer: Medicare HMO | Admitting: Occupational Therapy

## 2023-01-12 DIAGNOSIS — M6281 Muscle weakness (generalized): Secondary | ICD-10-CM

## 2023-01-12 DIAGNOSIS — R278 Other lack of coordination: Secondary | ICD-10-CM

## 2023-01-12 NOTE — Therapy (Signed)
Occupational Therapy Neuro Treatment Note   Patient Name: Sherry [name] MRN: [id number] DOB:[birth year], 88 y.o., female Today's Date: 11/29/2022  PCP:  Cindi Carbon PROVIDER: Earnestine Mealing, MD  END OF SESSION:   OT End of Session - 01/12/23 1534     Visit Number 99    Number of Visits 120    Date for OT Re-Evaluation 01/19/23    Authorization Time Period Progress report period starting 0812/2024    OT Start Time 1100    OT Stop Time 1145    OT Time Calculation (min) 45 min    Activity Tolerance Patient tolerated treatment well    Behavior During Therapy [facility] for tasks assessed/performed                Past Medical History:  Diagnosis Date   Acute blood loss anemia    Arthritis    Cancer (HCC)    skin   Central cord syndrome at C6 level of cervical spinal cord (HCC) 11/29/2017   Hypertension    Protein-calorie malnutrition, severe (HCC) 01/24/2018   S/P insertion of IVC (inferior vena caval) filter 01/24/2018   Tetraparesis (HCC)    Past Surgical History:  Procedure Laterality Date   ANTERIOR CERVICAL DECOMP/DISCECTOMY FUSION N/A 11/29/2017   Procedure: Cervical five-six, six-seven Anterior Cervical Decompression Fusion;  Surgeon: Jadene Pierini, MD;  Location: [facility] OR;  Service: Neurosurgery;  Laterality: N/A;  Cervical five-six, six-seven Anterior Cervical Decompression Fusion   CATARACT EXTRACTION     FEMUR IM NAIL Left 08/16/2021   Procedure: INTRAMEDULLARY (IM) NAIL FEMORAL;  Surgeon: Deeann Saint, MD;  Location: [facility] ORS;  Service: Orthopedics;  Laterality: Left;   IR IVC FILTER PLMT / S&I /IMG GUID/MOD SED  12/08/2017   Patient Active Problem List   Diagnosis Date Noted   Age-related osteoporosis without current pathological fracture 07/27/2022   Mild recurrent major depression (HCC) 07/27/2022   Hypertensive heart disease with other congestive heart failure (HCC) 02/14/2022   Chronic indwelling Foley catheter 02/14/2022   Closed hip fracture  (HCC) 08/15/2021   DVT (deep venous thrombosis) (HCC) 08/15/2021   Quadriplegia (HCC) 08/15/2021   Fall 08/15/2021   Constipation due to slow transit 08/31/2018   Trauma 06/05/2018   Neuropathic pain 06/05/2018   Neurogenic bowel 06/05/2018   Vaginal yeast infection 01/30/2018   Healthcare-associated pneumonia 01/25/2018   Chronic allergic rhinitis 01/24/2018   Depression with anxiety 01/24/2018   UTI due to Klebsiella species 01/24/2018   Protein-calorie malnutrition, severe (HCC) 01/24/2018   S/P insertion of IVC (inferior vena caval) filter 01/24/2018   Tetraparesis (HCC) 01/20/2018   Neurogenic bladder 01/20/2018   Reactive depression    Benign essential HTN    Acute postoperative anemia due to expected blood loss    Central cord syndrome at C6 level of cervical spinal cord (HCC) 11/29/2017   Allergy to alpha-gal 11/25/2016   SCC (squamous cell carcinoma) 04/08/2014   ONSET DATE: 11/29/2017  REFERRING DIAG: Central Cord Syndrome at C6 of the Cervical Spinal Cord, Fall with Bilateral Closed Hip Fractures, with ORIF repair with Intramedullary Nailing of the Left Hip Fracture.   THERAPY DIAG:  Muscle weakness (generalized)  Other lack of coordination  Rationale for Evaluation and Treatment: Rehabilitation  SUBJECTIVE:   SUBJECTIVE STATEMENT:  Pt reports  doing well today.  Pt accompanied by: self, Personal Care aide  PERTINENT HISTORY: Central Cord Syndrome at C6 of the Cervical Spinal Cord, Fall with Bilateral Closed Hip Fractures, with ORIF repair with Intramedullary Nailing  of the Left Hip Fracture.   PRECAUTIONS: None  WEIGHT BEARING RESTRICTIONS: No  PAIN:  Are you having pain? No pain, just shoulder stiffness   LIVING ENVIRONMENT: Lives with: [facility]  Stairs: No Has following equipment at home: Wheelchair (power)  PLOF: Independent   PATIENT GOALS:  See below for established goals  OBJECTIVE:  Note: Objective measures were  completed at Evaluation unless otherwise noted.  HAND DOMINANCE: Right  ADLs: Caregiver assist with ADLs, and [facility]  MOBILITY STATUS: Uses a power w/c  ACTIVITY TOLERANCE: Activity tolerance: Fair  FUNCTIONAL OUTCOME MEASURES:  Measurements:   12/09/2021:   Shoulder flexion: R: 120(136), L: 138(150) Shoulder abduction: R: 108(120), L: 110(120) Elbow: R: 0-148, L: 0-146 Wrist flexion: R: 55(62), L: 78 Writ extension: R: 34(55), L: 3(4) RD: R: 12(20), L: 16(26) UD: R: 8(24), L: 6(18) Thumb radial abduction: R: 11(20), L: 8(20) Digit flexion to the [facility]: R:  2nd: 5cm(3.5cm), 3rd: 7.5 cm(5cm), 4th: 7cm(3cm), 5th: 6cm(5.5cm) L: 2nd: 8cm(8cm), 3rd: 8cm(8cm), 4th: 7.5cm(7cm), 5th: 7cm(7cm)   02/03/2022:   Shoulder flexion: R: 122(138), L: 148(154) Shoulder abduction: R: 109(125), L: 125(125 Elbow: R: 0-148, L: 0-146 Wrist flexion: R: 60(68), L: 79 Writ extension: R: 40(55), L: 3(10) RD: R: 14(24), L: 20(28) UD: R: 8(24), L: 6(18) Thumb radial abduction: R: 20(25), L: 8(20) Digit flexion to the [facility]: R:  2nd: 4.5cm(3cm), 3rd: 6.5cm(4.5cm), 4th: 6.5 cm(3cm), 5th: 4.5cm(5cm) L: 2nd: 8cm(8cm), 3rd: 8cm(7cm), 4th: 7.5cm(7cm), 5th: 6.5cm(6.5cm)   04/07/2022:   Shoulder flexion: R: 125(150), L: 160(160) Shoulder abduction: R: 109(125), L: 125(125 Elbow: R: 0-148, L: 0-146 Wrist flexion: R: 60(68), L: 79 Writ extension: R: 40(55), L: 5(18) RD: R: 14(24), L: 20(28) UD: R: 18(24), L: 8(18) Thumb radial abduction: R: 20(25), L: 8(20) Digit flexion to the [facility]: R:  2nd: 4.5 cm(4 cm), 3rd: 6.5cm(3cm), 4th: 7 cm (5cm), 5th: 5cm(5cm) L: 2nd: 8cm(8cm), 3rd: 8cm(7cm), 4th: 7.5cm(7cm), 5th: 7cm(7cm)   07/05/2022:   Shoulder flexion: R: 125(154), L: 160(160) Shoulder abduction: R: 95(130), L: 143(143) Elbow: R: 0-148, L: 0-146 Wrist flexion: R: 60(68), L: 79 Writ extension: R: 40(60), L: 0(12) RD: R: 10(24), L: 12(20) UD: R: 16(24), L: 8(16) Thumb radial abduction: R:  20(25), L: 8(20) Digit flexion to the [facility]: R:  2nd: 4 cm(4cm), 3rd: 6.5cm( 3.5cm), 4th: 7 cm (5cm), 5th: 6cm(5cm) L: 2nd: 8cm(8cm), 3rd: 8cm(7cm), 4th: 8cm(7cm), 5th: 7cm(7cm)   09/13/2022:   Shoulder flexion: R: 125(154), L: 160(160) Shoulder abduction: R: 100(130), L: 143(143) Elbow: R: 0-150, L: 0-150 Wrist flexion: R: 55(68), L: 79 Writ extension: R: 45(60), L: 0(12) RD: R: 10(24), L: 12(20) UD: R: 16(24), L: 8(16) Thumb radial abduction: R: 20(25), L: 8(20) Digit flexion to the [facility]: R:  2nd: 4 cm(4cm), 3rd: 6cm( 5cm), 4th: 6 cm (4cm), 5th: 5cm(5cm) L: 2nd: 7cm(cm), 3rd: 7cm(6cm), 4th: 7cm(6cm), 5th: 6cm(6cm)   10/27/2022   Shoulder flexion: R: 128(154), L: 160(160) Shoulder abduction: R: 105(130), L: 143(143) Elbow: R: 0-150, L: 0-150 Wrist flexion: R: 55(68), L: 79 Writ extension: R: 46(64), L: 0(20) RD: R: 10(20), L: 18(24) UD: R: 118(28), L: 8(20) Thumb radial abduction: R: 30(42), L:20(24) Digit flexion to the [facility]: R:  2nd: 4 cm(3cm), 3rd: 6cm( 6cm), 4th: 6 cm (5cm), 5th: 5cm(5cm) L: 2nd: 8cm(8cm), 3rd: 8cm(6cm), 4th: 8cm(7cm), 5th: 6cm(6cm)  COORDINATION:  Impaired 2/2 bilateral hand digit MP, PIP, and DIP extension tightness  SENSATION: Intact  EDEMA: N/A   COGNITION: Overall cognitive  status: WNL   VISION: Subjective report: Wears glasses, No changes in vision.  PERCEPTION: Intact  TODAY'S TREATMENT:                                                                                                                              DATE: 01/10/2023    Manual Therapy:   Pt. tolerated soft tissue massage to the volar, and dorsal surface of each digit on the bilateral hands, digit lateral, and sagittal bands 2/2 to stiffness following moist heat modality. Soft tissue mobilization was performed for carpal, and metacarpal spread stretches. Manual therapy was performed independent of, and in preparation for therapeutic Ex.     Therapeutic Ex.    Pt. tolerated PROM  followed by AROM bilateral wrist extension, PROM for bilateral digit MP, PIP, and DIP flexion, and extension, thumb radial, and palmar abduction. Pt. Worked on BUE strengthening, and reciprocal motion using the UBE while seated for 8 min. with no resistance. Constant monitoring was provided. Marland Kitchen  PATIENT EDUCATION: Education details: Bilateral hand function, ROM, and strengthening Person educated: Patient Education method: Explanation, Demonstration, and Verbal cues Education comprehension: verbalized understanding and returned demonstration  HOME EXERCISE PROGRAM: Continue to assess HEP needs, and provide as needed.   GOALS: Goals reviewed with patient? Yes  LONG TERM GOALS: Target date: 01/19/2023  3.  Patient will demonstrate improved composite finger flexion to be able to firmly hold and use adaptive devices during ADLs, and IADLs.  Baseline: 12/06/2022: Pt. continues to present with increased MP, PIP, and DIP digit tightness/stiffness limiting the formulation of bilateral composites fists. 10/27/2022: Pt. presents with increased MP, PIP, and DIP digit tightness/stiffness limiting the formulation of bilateral composites fists during this progress reporting period limiting. Digit flexion to the [facility]: R:  2nd: 4 cm(3cm), 3rd: 6cm( 6cm), 4th: 6 cm (5cm), 5th: 5cm(5cm), L: 2nd: 8cm(8cm), 3rd: 8cm(6cm), 4th: 8cm(7cm), 5th: 6cm(6cm) 09/20/2022: Improving digit flexion to the [facility]. 09/13/22: improved digit flexion to the [facility]. 07/05/2022: Pt. presents with digit MP, PIP, and DIP extensor tightness limiting her ability to securely grip objects in her bilateral hands.  04/07/2022: Pt. Has improved with right 2nd, and 3rd digit flexion towards the [facility]. Pt. Continues to have difficulty securely holding and applying deodorant.02/03/2022: Pt. Has improved with digit flexion, however, continues to have difficulty securely holding and using deodorant. 12/09/2021: Bilateral hand/digit MP, PIP, and DIP extension tightness  limits her ability achieve digit flexion to hold, and apply deodorant. 10/28/2021:  Pt. continues to have difficulty holding the deodorant. 01/12/2021: Pt. Presents with limited digit extension. Pt. Is able to initiate holding deodorant, however is unable to hold it while using it  Goal status: Ongoing   4.  Pt. Will improve bilateral wrist extension in preparation for anticipating, and initiating reaching for objects at the table.  Baseline: 12/06/2022: Pt continues to to progress with bilateral wrist extension in preparation fro functional reaching. 10/27/2022: Pt. Is improving with bilateral wrist  extension. R: 46(64) L: 0(20) 09/20/2022: limited PROM in the Left wrist extension 2/2 tightness/stiffness. 09/13/22: R: 55(68), L: 0(12) 07/19/2022: Bilateral wrist extension in limited. 07/05/2022: Right: 40(60) Left: 0(12) 04/07/2022: Right: 40(55) Left: 5(18) 02/03/2022: Right: 34(55) Left: 3(4) 12/09/2021: Right  34(55)  Left  3(4) 10/28/2021:  Right 22(38), Left 0(15) 09/14/2021:  Right: 17(35), left 2(15)  Goal status: Ongoing  7.  Pt. Will increase bilateral lateral pinch strength by 2 lbs to be able to securely grasp items during ADLs, and IADL tasks.  Baseline: 12/06/2022: Pt. Is able to more securely hold objects during ADLs/IADLs 10/27/2022: Pt. Is improving with holding items with her bilateral thumbs. (Pinch meter out for calibration) 09/20/2022: lateral pinch continues to be limited 09/13/22: R 6.5# L 4# 07/19/2022: TBD 07/05/2022: TBD 07/2022: NT-Pinch meter out for calibration. 02/03/2022: Right: 6#, Left: 4# 12/09/2021: Right: 6#, Left: 4#  Goal status: Ongoing   9.  Pt. will complete plant care with modified independence.  Baseline:12/06/2022: Pt. Is independent with plant care in her husband's room, however is not able to access her plants due to furniture blocking access to them. 10/27/2022: Pt. Is able to water plants that are closer, and within reach. Pt. continues to have difficulty reaching for  thorough plant care. 09/20/2022: Pt. Continues to have difficulty reaching for thorough plant care. 09/13/22: continues to report intermittent difficulty 07/19/2022: Pt. Continues to be able to water, and care for some of her plants. Pt. Has more difficulty with plants that are harder to reach. 07/05/2022: pt. Is able to water, and care for some of her plants. Pt. Has more difficulty with plants that are harder to reach. 04/07/2022: Pt. is now able to hold a cup and water her plants.02/03/2022: Pt. has difficulty caring for her plants.  Goal status:Achieved   10.  Pt. will demonstrate adaptive techniques to assist with the efficiency of self-dressing, or morning care tasks.  Baseline: 12/06/2022: Pt. requires assist from staff, as Pt. Reports staff have decreased time. 10/27/2022: Pt. is able to assist with initiating UE dressing. Pt. requires assist from personal, and staff care aides. 09/20/2022: Pt. continues to require assist form personal, and staff care aides. 09/13/22: MODA dressing, reports she is often rushed 07/19/2022: Pt. continues to require assist with self-dressing/morning care tasks. 07/05/2022: Continue with goal. 04/07/2022: Pt. Continues to require assist with the efficiency of self-dressing, and morning care tasks.02/03/2022: Pt. requires assist from caregivers 2/2 time limitations during morning care.  Goal status: Ongoing  11.  Pt. will improve BUE strength to improve ADL, and IADL functioning.  Baseline: 12/06/2022: Continue 10/27/2022: shoulder flexion L 4/5, R 3+/5; shoulder abduction L 4+/5, R 3+/5. elbow flexion B 5/5, elbow extension 4/5 09/20/2022: Pt. Presents with limited BUE strength 09/13/22: shoulder flexion L 4/5, R 3/5; elbow flexion B 5/5, elbow extension 4-/5; shoulder abduction L 4+/5, R 3+/5. 07/19/2022: BUE strength continues to be limited. 07/05/2022: shoulder flexion: right 4-/5, abduction: 3+/5, elbow flexion: right: 5/5, left 5/5, extension: right: 4-/5, left 4-/5, wrist extension:  right: 3-/5, left: 2-/5  Goal status: Ongoing  12.  Pt. will improve bilateral thumb radial abduction in order to be able to hold the grab bars while standing with PT  Baseline: 12/06/2022: Pt. Continues to present  with limited thumb abduction, however is improving holding onto the parallel bars.10/27/2022: thumb radial abduction: R: 30(42), L: 20(24  Goal status: Ongoing  13.  Pt. Will be able to securely hold, and stabilize medication bottles at a tabletop surface  when opening, and closing them.  Baseline: 12/06/2022: Pt is improving with securely stabilizing medication bottles. 10/27/2022: Pt. Stabilizes bottles against her torso when attempting to open, and close them   Goal status:Ongoing    ASSESSMENT:  CLINICAL IMPRESSION:  Pt. continues to report no pain. Pt. tolerated UE there. Ex, and manual therapy well, without reports of pain. Pt. continues to present with tightness at the bilateral thumb webspace, bilateral wrist flexor tightness, as well as digit MP, PIP, and DIP extensor tightness which continues to limit her ability to complete ADL tasks efficiently. Pt. continues to benefit from working on impoving ROM, and UE strength in order to work towards increasing bilateral hand grasp on objects, and increasing engagement of bilateral hands during ADLs, and IADL tasks. Plan to complete the progress report, and the recertification next visit.   PERFORMANCE DEFICITS: in functional skills including ADLs, IADLs, coordination, dexterity, ROM, strength, and UE functional use, cognitive skills including , and psychosocial skills including coping strategies and environmental adaptation.   IMPAIRMENTS: are limiting patient from ADLs, IADLs, and leisure.   CO-MORBIDITIES: may have co-morbidities  that affects occupational performance. Patient will benefit from skilled OT to address above impairments and improve overall function.  MODIFICATION OR ASSISTANCE TO COMPLETE EVALUATION: Min-Moderate  modification of tasks or assist with assess necessary to complete an evaluation.  OT OCCUPATIONAL PROFILE AND HISTORY: Detailed assessment: Review of records and additional review of physical, cognitive, psychosocial history related to current functional performance.  CLINICAL DECISION MAKING: Moderate - several treatment options, min-mod task modification necessary  REHAB POTENTIAL: Good for stated goals  PLAN:  OT FREQUENCY 2x's a week  OT DURATION: 12 weeks  PLANNED INTERVENTIONS ADL training, A/E training, UE ther. Ex, Manual therapy, neuromuscular re-education, moist heat modality, Paraffin Bath, Splinting, and  Pt./caregiver education   RECOMMENDED OTHER SERVICES: PT  CONSULTED AND AGREED WITH PLAN OF CARE: Patient  PLAN FOR NEXT SESSION: Continue Treatment as per established POC  Olegario Messier, MS, OTR/L  01/12/2023

## 2023-01-17 ENCOUNTER — Ambulatory Visit: Payer: Medicare HMO

## 2023-01-17 ENCOUNTER — Ambulatory Visit: Payer: Medicare HMO | Admitting: Occupational Therapy

## 2023-01-18 ENCOUNTER — Non-Acute Institutional Stay (SKILLED_NURSING_FACILITY): Payer: Self-pay | Admitting: Nurse Practitioner

## 2023-01-18 ENCOUNTER — Encounter: Payer: Self-pay | Admitting: Nurse Practitioner

## 2023-01-18 DIAGNOSIS — L299 Pruritus, unspecified: Secondary | ICD-10-CM | POA: Diagnosis not present

## 2023-01-18 DIAGNOSIS — M810 Age-related osteoporosis without current pathological fracture: Secondary | ICD-10-CM

## 2023-01-18 DIAGNOSIS — I959 Hypotension, unspecified: Secondary | ICD-10-CM

## 2023-01-18 DIAGNOSIS — G8250 Quadriplegia, unspecified: Secondary | ICD-10-CM

## 2023-01-18 DIAGNOSIS — F330 Major depressive disorder, recurrent, mild: Secondary | ICD-10-CM | POA: Diagnosis not present

## 2023-01-18 DIAGNOSIS — Z86718 Personal history of other venous thrombosis and embolism: Secondary | ICD-10-CM

## 2023-01-18 DIAGNOSIS — I890 Lymphedema, not elsewhere classified: Secondary | ICD-10-CM

## 2023-01-18 NOTE — Progress Notes (Deleted)
Location:  Other [facility].  Nursing Home Room Number: [facility] 219A Place of Service:  SNF [phone]) Abbey Chatters NP  PCP: Earnestine Mealing, MD  Patient Care Team: Earnestine Mealing, MD as PCP - General (Family Medicine)  Extended Emergency Contact Information Primary Emergency Contact: [name],[name] Mobile Phone: [phone] Relation: Daughter Secondary Emergency Contact: Domanique, Quaderer Address: [address]          [place], [place] [address] Darden [place] of [place] Home Phone: [phone] Mobile Phone: [phone] Relation: Spouse  Goals of care: Advanced Directive information    01/18/2023   12:27 PM  Advanced Directives  Does Patient Have a Medical Advance Directive? Yes  Type of Estate agent of [place];Out of facility DNR (pink MOST or yellow form);Living will  Does patient want to make changes to medical advance directive? No - Patient declined  Copy of Healthcare Power of Attorney in Chart? Yes - validated most recent copy scanned in chart (See row information)     Chief Complaint  Patient presents with   Medical Management of Chronic Issues    Medical Management of Chronic Issues.     HPI:  Pt is a 88 y.o. female seen today for medical management of chronic disease.    Past Medical History:  Diagnosis Date   Acute blood loss anemia    Arthritis    Cancer (HCC)    skin   Central cord syndrome at C6 level of cervical spinal cord (HCC) 11/29/2017   Hypertension    Protein-calorie malnutrition, severe (HCC) 01/24/2018   S/P insertion of IVC (inferior vena caval) filter 01/24/2018   Tetraparesis (HCC)    Past Surgical History:  Procedure Laterality Date   ANTERIOR CERVICAL DECOMP/DISCECTOMY FUSION N/A 11/29/2017   Procedure: Cervical five-six, six-seven Anterior Cervical Decompression Fusion;  Surgeon: Jadene Pierini, MD;  Location: [facility] OR;  Service: Neurosurgery;  Laterality: N/A;  Cervical five-six, six-seven  Anterior Cervical Decompression Fusion   CATARACT EXTRACTION     FEMUR IM NAIL Left 08/16/2021   Procedure: INTRAMEDULLARY (IM) NAIL FEMORAL;  Surgeon: Deeann Saint, MD;  Location: [facility] ORS;  Service: Orthopedics;  Laterality: Left;   IR IVC FILTER PLMT / S&I /IMG GUID/MOD SED  12/08/2017    Allergies  Allergen Reactions   Other Swelling   Lisinopril-Hydrochlorothiazide Swelling    Swelling of the tongue   Beef-Derived Drug Products Swelling    Facial and lip swelling (due to tick bite)   Dairy Aid [Tilactase] Swelling   Lisinopril-Hydrochlorothiazide Swelling    Tongue swelling   Pork-Derived Products Swelling    Outpatient Encounter Medications as of 01/18/2023  Medication Sig   acetaminophen (TYLENOL) 500 MG tablet Take 500 mg by mouth every 12 (twelve) hours as needed for mild pain.   acetaminophen (TYLENOL) 650 MG CR tablet Take 650 mg by mouth every 8 (eight) hours as needed for pain.   acetic acid 0.25 % irrigation Use 30ml via irrigation twice daily to keep foley Cath patent.   alendronate (FOSAMAX) 70 MG tablet Take 70 mg by mouth every Saturday.   calcium carbonate (CALCIUM 600) 600 MG TABS tablet Take 600 mg by mouth at bedtime.   Capsaicin 0.1 % CREA Apply 1 application  topically every evening. (Apply to hands)   cetirizine (ZYRTEC) 5 MG tablet Take 5 mg by mouth 2 (two) times daily.   cholecalciferol (VITAMIN D3) 25 MCG (1000 UNIT) tablet Take 1,000 Units by mouth daily.   DULoxetine (CYMBALTA) 60 MG capsule  Take 60 mg by mouth daily.   ferrous sulfate 325 (65 FE) MG tablet Take 325 mg by mouth daily with breakfast.   fluocinonide (LIDEX) 0.05 % external solution Apply 1 Application topically every 12 (twelve) hours as needed.   gabapentin (NEURONTIN) 100 MG capsule Take 1 capsule (100 mg total) by mouth daily.   hydroxypropyl methylcellulose / hypromellose (ISOPTO TEARS / GONIOVISC) 2.5 % ophthalmic solution Place 1 drop into both eyes every hour as needed for dry  eyes.   Infant Care Products [facility]) OINT Apply to buttocks/gluteal folds topically every shift.   ketoconazole (NIZORAL) 2 % shampoo Apply 1 Application topically 2 (two) times a week.   lactulose, encephalopathy, (CHRONULAC) 10 GM/15ML SOLN Take 30 mLs by mouth daily as needed (mild constipation).   magnesium citrate solution Take 60-120 mLs by mouth daily as needed (constipation).   methylcellulose oral powder Take 1 packet by mouth at bedtime.   mometasone (ELOCON) 0.1 % lotion Apply 1 Application topically every Saturday at 6 PM. (Apply to the ear canal for eczema)   Multiple Vitamin (MULTIVITAMIN WITH MINERALS) TABS tablet Take 1 tablet by mouth daily.   olopatadine (PATADAY) 0.1 % ophthalmic solution Place 1 drop into both eyes 2 (two) times daily.   polyethylene glycol (MIRALAX / GLYCOLAX) 17 g packet Take 17 g by mouth 2 (two) times daily.   rivaroxaban (XARELTO) 10 MG TABS tablet Take 10 mg by mouth daily.   saccharomyces boulardii (FLORASTOR) 250 MG capsule Take 250 mg by mouth daily.   Saline Spray 0.2 % SOLN Place 2 sprays into both nostrils as needed (nasal congestion).   simethicone (MYLICON) 80 MG chewable tablet Chew 80 mg by mouth every 6 (six) hours as needed for flatulence.   sodium phosphate (FLEET) 7-19 GM/118ML ENEM Place 1 enema rectally every other day as needed for severe constipation.   spironolactone (ALDACTONE) 25 MG tablet Take 0.5 tablets (12.5 mg total) by mouth daily.   tiZANidine (ZANAFLEX) 2 MG tablet Take 1 tablet (2 mg total) by mouth 2 (two) times daily.   triamcinolone cream (KENALOG) 0.1 % Apply to thigh and lower legs topically every 8 hours as needed. Apply to back of neck topically as needed.   Zinc Oxide (TRIPLE PASTE) 12.8 % ointment Apply 1 Application topically. Every shift.   midodrine (PROAMATINE) 2.5 MG tablet Take 1 tablet (2.5 mg total) by mouth 3 (three) times daily as needed. (Patient not taking: Reported on 01/18/2023)   No  facility-administered encounter medications on file as of 01/18/2023.    Review of Systems ***  Immunization History  Administered Date(s) Administered   Influenza, High Dose Seasonal PF 11/17/2021   Influenza-Unspecified 11/14/2018, 11/20/2019, 11/17/2020, 11/17/2021, 11/24/2022   Moderna Covid-19 Fall Seasonal Vaccine 26yrs & older 05/11/2022   Moderna Covid-19 Vaccine Bivalent Booster 33yrs & up 06/30/2021, 12/11/2021, 10/29/2022   Moderna Sars-Covid-2 Vaccination 02/09/2019, 03/09/2019, 12/14/2019, 06/20/2020, 10/24/2020   PPD Test 05/16/2019   Tdap 11/29/2017   Zoster Recombinant(Shingrix) 09/02/2022   Pertinent  Health Maintenance Due  Topic Date Due   INFLUENZA VACCINE  Completed   DEXA SCAN  Discontinued      08/19/2021   10:00 AM 08/19/2021    8:54 PM 08/20/2021    8:20 AM 03/29/2022   10:11 AM 08/31/2022    2:42 PM  Fall Risk  Falls in the past year?    0 0  Was there an injury with Fall?    0   Fall Risk Category  Calculator    0   (RETIRED) Patient Fall Risk Level High fall risk High fall risk High fall risk    Patient at Risk for Falls Due to    No Fall Risks    Functional Status Survey:    Vitals:   01/18/23 1204  BP: 132/72  Pulse: 70  Resp: 18  Temp: (!) 97.5 F (36.4 C)  SpO2: 95%  Weight: 183 lb 12.8 oz (83.4 kg)  Height: 5' 8.5" (1.74 m)   Body mass index is 27.54 kg/m. Physical Exam***  Labs reviewed: Recent Labs    04/21/22 1200 06/02/22 0000 09/29/22 0000 11/04/22 0000  NA 133* 140 139 139  K 3.8 4.6 3.8 3.8  CL 100 102 100 100  CO2 30 31* 29* 32*  GLUCOSE 108*  --   --   --   BUN 8 13 10 9   CREATININE 0.68 0.5 0.5 0.5  CALCIUM 8.0* 9.0 8.6* 8.8   Recent Labs    02/08/22 0000 09/29/22 0000  AST 18 20  ALT 14 14  ALKPHOS 65 58  ALBUMIN 3.7 3.6   Recent Labs    03/08/22 0000 04/21/22 1200 04/21/22 1731 04/26/22 0000 05/03/22 0000 05/10/22 0000 05/17/22 0000 09/30/22 0000  WBC 6.2 7.1  --  5.7  --   --   --  4.7   NEUTROABS 3,683.00  --   --  3,283.00  --   --   --  2,627.00  HGB 11.9* 8.5*   < > 8.3*   < > 10.0* 10.7* 13.2  HCT 35* 26.6*   < > 26*   < > 31* 33* 41  MCV  --  99.3  --   --   --   --   --   --   PLT 360 348  --  444*  --   --   --  280   < > = values in this interval not displayed.   Lab Results  Component Value Date   TSH 1.31 09/29/2022   No results found for: "HGBA1C" No results found for: "CHOL", "HDL", "LDLCALC", "LDLDIRECT", "TRIG", "CHOLHDL"  Significant Diagnostic Results in last 30 days:  No results found.  Assessment/Plan There are no diagnoses linked to this encounter.   Janene Harvey. Biagio Borg [facility] [phone]

## 2023-01-18 NOTE — Progress Notes (Signed)
Location:  Other Nursing Home Room Number: [facility] [address] Place of Service:  SNF [phone])  Sydnee Cabal, [place], MD  Patient Care Team: Earnestine Mealing, MD as PCP - General (Family Medicine)  Extended Emergency Contact Information Primary Emergency Contact: [name],[name] Mobile Phone: [phone] Relation: Daughter Secondary Emergency Contact: Freada, Alston Address: [address]          [place], [place] [address] Darden [place] of [place] Home Phone: [phone] Mobile Phone: [phone] Relation: Spouse  Goals of care: Advanced Directive information    01/18/2023   12:27 PM  Advanced Directives  Does Patient Have a Medical Advance Directive? Yes  Type of Estate agent of [place];Out of facility DNR (pink MOST or yellow form);Living will  Does patient want to make changes to medical advance directive? No - Patient declined  Copy of Healthcare Power of Attorney in Chart? Yes - validated most recent copy scanned in chart (See row information)     Chief Complaint  Patient presents with   Medical Management of Chronic Issues    Medical Management of Chronic Issues.     HPI:  Pt is a 88 y.o. female seen today for medical management of chronic disease.  She continues PT twice weekly Went to [place] [facility] to have legs evaluated to LE edema. Feels like it lymph related and order thigh high compression hose which has helped.  Blood pressure has been variable.   Osteoporosis- continues on fosamax weekly with cal and vit d  Allergies/sinuses/itching- continues on zyrtec 5 mg twice daily   Tetraparesis- on gabapentin daily due to numbness in the hands.  Reports she has had ongoing itching, reports its been ongoing since she got to [facility] years ago.  No rash.  Some days are better than other.   Mood has been stable.     Past Medical History:  Diagnosis Date   Acute blood loss anemia    Arthritis    Cancer (HCC)    skin   Central cord  syndrome at C6 level of cervical spinal cord (HCC) 11/29/2017   Hypertension    Protein-calorie malnutrition, severe (HCC) 01/24/2018   S/P insertion of IVC (inferior vena caval) filter 01/24/2018   Tetraparesis (HCC)    Past Surgical History:  Procedure Laterality Date   ANTERIOR CERVICAL DECOMP/DISCECTOMY FUSION N/A 11/29/2017   Procedure: Cervical five-six, six-seven Anterior Cervical Decompression Fusion;  Surgeon: Jadene Pierini, MD;  Location: [facility] OR;  Service: Neurosurgery;  Laterality: N/A;  Cervical five-six, six-seven Anterior Cervical Decompression Fusion   CATARACT EXTRACTION     FEMUR IM NAIL Left 08/16/2021   Procedure: INTRAMEDULLARY (IM) NAIL FEMORAL;  Surgeon: Deeann Saint, MD;  Location: [facility] ORS;  Service: Orthopedics;  Laterality: Left;   IR IVC FILTER PLMT / S&I /IMG GUID/MOD SED  12/08/2017    Allergies  Allergen Reactions   Other Swelling   Lisinopril-Hydrochlorothiazide Swelling    Swelling of the tongue   Beef-Derived Drug Products Swelling    Facial and lip swelling (due to tick bite)   Dairy Aid [Tilactase] Swelling   Lisinopril-Hydrochlorothiazide Swelling    Tongue swelling   Pork-Derived Products Swelling    Outpatient Encounter Medications as of 01/18/2023  Medication Sig   acetaminophen (TYLENOL) 500 MG tablet Take 500 mg by mouth every 12 (twelve) hours as needed for mild pain.   acetaminophen (TYLENOL) 650 MG CR tablet Take 650 mg by mouth every 8 (eight) hours as needed for pain.   acetic acid 0.25 %  irrigation Use 30ml via irrigation twice daily to keep foley Cath patent.   alendronate (FOSAMAX) 70 MG tablet Take 70 mg by mouth every Saturday.   calcium carbonate (CALCIUM 600) 600 MG TABS tablet Take 600 mg by mouth at bedtime.   Capsaicin 0.1 % CREA Apply 1 application  topically every evening. (Apply to hands)   cetirizine (ZYRTEC) 5 MG tablet Take 5 mg by mouth 2 (two) times daily.   cholecalciferol (VITAMIN D3) 25 MCG (1000 UNIT)  tablet Take 1,000 Units by mouth daily.   DULoxetine (CYMBALTA) 60 MG capsule Take 60 mg by mouth daily.   ferrous sulfate 325 (65 FE) MG tablet Take 325 mg by mouth daily with breakfast.   fluocinonide (LIDEX) 0.05 % external solution Apply 1 Application topically every 12 (twelve) hours as needed.   gabapentin (NEURONTIN) 100 MG capsule Take 1 capsule (100 mg total) by mouth daily.   hydroxypropyl methylcellulose / hypromellose (ISOPTO TEARS / GONIOVISC) 2.5 % ophthalmic solution Place 1 drop into both eyes every hour as needed for dry eyes.   Infant Care Products [facility]) OINT Apply to buttocks/gluteal folds topically every shift.   ketoconazole (NIZORAL) 2 % shampoo Apply 1 Application topically 2 (two) times a week.   lactulose, encephalopathy, (CHRONULAC) 10 GM/15ML SOLN Take 30 mLs by mouth daily as needed (mild constipation).   magnesium citrate solution Take 60-120 mLs by mouth daily as needed (constipation).   methylcellulose oral powder Take 1 packet by mouth at bedtime.   mometasone (ELOCON) 0.1 % lotion Apply 1 Application topically every Saturday at 6 PM. (Apply to the ear canal for eczema)   Multiple Vitamin (MULTIVITAMIN WITH MINERALS) TABS tablet Take 1 tablet by mouth daily.   olopatadine (PATADAY) 0.1 % ophthalmic solution Place 1 drop into both eyes 2 (two) times daily.   polyethylene glycol (MIRALAX / GLYCOLAX) 17 g packet Take 17 g by mouth 2 (two) times daily.   rivaroxaban (XARELTO) 10 MG TABS tablet Take 10 mg by mouth daily.   saccharomyces boulardii (FLORASTOR) 250 MG capsule Take 250 mg by mouth daily.   Saline Spray 0.2 % SOLN Place 2 sprays into both nostrils as needed (nasal congestion).   simethicone (MYLICON) 80 MG chewable tablet Chew 80 mg by mouth every 6 (six) hours as needed for flatulence.   sodium phosphate (FLEET) 7-19 GM/118ML ENEM Place 1 enema rectally every other day as needed for severe constipation.   spironolactone (ALDACTONE) 25 MG tablet Take  0.5 tablets (12.5 mg total) by mouth daily.   tiZANidine (ZANAFLEX) 2 MG tablet Take 1 tablet (2 mg total) by mouth 2 (two) times daily.   triamcinolone cream (KENALOG) 0.1 % Apply to thigh and lower legs topically every 8 hours as needed. Apply to back of neck topically as needed.   Zinc Oxide (TRIPLE PASTE) 12.8 % ointment Apply 1 Application topically. Every shift.   [DISCONTINUED] midodrine (PROAMATINE) 2.5 MG tablet Take 1 tablet (2.5 mg total) by mouth 3 (three) times daily as needed. (Patient not taking: Reported on 01/18/2023)   No facility-administered encounter medications on file as of 01/18/2023.    Review of Systems  Constitutional:  Negative for activity change, appetite change, fatigue and unexpected weight change.  HENT:  Negative for congestion and hearing loss.   Eyes: Negative.   Respiratory:  Negative for cough and shortness of breath.   Cardiovascular:  Positive for leg swelling. Negative for chest pain and palpitations.  Gastrointestinal:  Negative for abdominal pain,  constipation and diarrhea.  Genitourinary:  Positive for difficulty urinating (chronic foley).  Musculoskeletal:  Negative for arthralgias and myalgias.  Skin:  Negative for color change and wound.  Neurological:  Positive for weakness. Negative for dizziness.  Psychiatric/Behavioral:  Negative for agitation, behavioral problems and confusion.      Immunization History  Administered Date(s) Administered   Influenza, High Dose Seasonal PF 11/17/2021   Influenza-Unspecified 11/14/2018, 11/20/2019, 11/17/2020, 11/17/2021, 11/24/2022   Moderna Covid-19 Fall Seasonal Vaccine 40yrs & older 05/11/2022   Moderna Covid-19 Vaccine Bivalent Booster 79yrs & up 06/30/2021, 12/11/2021, 10/29/2022   Moderna Sars-Covid-2 Vaccination 02/09/2019, 03/09/2019, 12/14/2019, 06/20/2020, 10/24/2020   PPD Test 05/16/2019   Tdap 11/29/2017   Zoster Recombinant(Shingrix) 09/02/2022   Pertinent  Health Maintenance Due   Topic Date Due   INFLUENZA VACCINE  Completed   DEXA SCAN  Discontinued      08/19/2021   10:00 AM 08/19/2021    8:54 PM 08/20/2021    8:20 AM 03/29/2022   10:11 AM 08/31/2022    2:42 PM  Fall Risk  Falls in the past year?    0 0  Was there an injury with Fall?    0   Fall Risk Category Calculator    0   (RETIRED) Patient Fall Risk Level High fall risk High fall risk High fall risk    Patient at Risk for Falls Due to    No Fall Risks    Functional Status Survey:    Vitals:   01/18/23 1204  BP: 132/72  Pulse: 70  Resp: 18  Temp: (!) 97.5 F (36.4 C)  SpO2: 95%  Weight: 183 lb 12.8 oz (83.4 kg)  Height: 5' 8.5" (1.74 m)   Body mass index is 27.54 kg/m. Physical Exam Constitutional:      General: She is not in acute distress.    Appearance: She is well-developed. She is not diaphoretic.  HENT:     Head: Normocephalic and atraumatic.     Mouth/Throat:     Pharynx: No oropharyngeal exudate.  Eyes:     Conjunctiva/sclera: Conjunctivae normal.     Pupils: Pupils are equal, round, and reactive to light.  Cardiovascular:     Rate and Rhythm: Normal rate and regular rhythm.     Heart sounds: Normal heart sounds.  Pulmonary:     Effort: Pulmonary effort is normal.     Breath sounds: Normal breath sounds.  Abdominal:     General: Bowel sounds are normal.     Palpations: Abdomen is soft.  Musculoskeletal:     Cervical back: Normal range of motion and neck supple.  Skin:    General: Skin is warm and dry.  Neurological:     Mental Status: She is alert and oriented to person, place, and time.     Motor: Weakness present.     Gait: Gait abnormal.  Psychiatric:        Mood and Affect: Mood normal.     Labs reviewed: Recent Labs    04/21/22 1200 06/02/22 0000 09/29/22 0000 11/04/22 0000  NA 133* 140 139 139  K 3.8 4.6 3.8 3.8  CL 100 102 100 100  CO2 30 31* 29* 32*  GLUCOSE 108*  --   --   --   BUN 8 13 10 9   CREATININE 0.68 0.5 0.5 0.5  CALCIUM 8.0* 9.0  8.6* 8.8   Recent Labs    02/08/22 0000 09/29/22 0000  AST 18 20  ALT 14 14  ALKPHOS 65 58  ALBUMIN 3.7 3.6   Recent Labs    03/08/22 0000 04/21/22 1200 04/21/22 1731 04/26/22 0000 05/03/22 0000 05/10/22 0000 05/17/22 0000 09/30/22 0000  WBC 6.2 7.1  --  5.7  --   --   --  4.7  NEUTROABS 3,683.00  --   --  3,283.00  --   --   --  2,627.00  HGB 11.9* 8.5*   < > 8.3*   < > 10.0* 10.7* 13.2  HCT 35* 26.6*   < > 26*   < > 31* 33* 41  MCV  --  99.3  --   --   --   --   --   --   PLT 360 348  --  444*  --   --   --  280   < > = values in this interval not displayed.   Lab Results  Component Value Date   TSH 1.31 09/29/2022   No results found for: "HGBA1C" No results found for: "CHOL", "HDL", "LDLCALC", "LDLDIRECT", "TRIG", "CHOLHDL"  Significant Diagnostic Results in last 30 days:  No results found.  Assessment/Plan 1. Hypotension, unspecified hypotension type (Primary) -blood pressure have been well controlled.   2. Mild recurrent major depression (HCC) Stable on cymbalta   3. Age-related osteoporosis without current pathological fracture -continue on cal and vit d with fosmaax weekly  4. Pruritus Ongoing, has been long standing and without change.   5. History of DVT (deep vein thrombosis) Continues on xarelto, no signs of abnormal bruising or bleeding  6. Tetraparesis (HCC) Stable, continues with PT, continues on gabapentin.   7. Lymphedema Improved on compression hose, continues to wear daily     Cato Liburd K. Biagio Borg [facility] [phone]

## 2023-01-19 ENCOUNTER — Ambulatory Visit: Payer: Medicare HMO | Admitting: Occupational Therapy

## 2023-01-19 ENCOUNTER — Ambulatory Visit: Payer: Medicare HMO

## 2023-01-19 DIAGNOSIS — S14129S Central cord syndrome at unspecified level of cervical spinal cord, sequela: Secondary | ICD-10-CM

## 2023-01-19 DIAGNOSIS — M6281 Muscle weakness (generalized): Secondary | ICD-10-CM

## 2023-01-19 DIAGNOSIS — R2689 Other abnormalities of gait and mobility: Secondary | ICD-10-CM

## 2023-01-19 DIAGNOSIS — R278 Other lack of coordination: Secondary | ICD-10-CM

## 2023-01-19 DIAGNOSIS — R269 Unspecified abnormalities of gait and mobility: Secondary | ICD-10-CM

## 2023-01-19 DIAGNOSIS — S72001A Fracture of unspecified part of neck of right femur, initial encounter for closed fracture: Secondary | ICD-10-CM

## 2023-01-19 DIAGNOSIS — R262 Difficulty in walking, not elsewhere classified: Secondary | ICD-10-CM

## 2023-01-19 DIAGNOSIS — S72002A Fracture of unspecified part of neck of left femur, initial encounter for closed fracture: Secondary | ICD-10-CM

## 2023-01-19 NOTE — Therapy (Signed)
Occupational Therapy Progress/Recertification Note  Dates of reporting period  12/06/2022   to   01/19/2023    Patient Name: Sherry [name] MRN: [id number] DOB:02/21/1934, 88 y.o., female Today's Date: 11/29/2022  PCP:  Cindi Carbon PROVIDER: Earnestine Mealing, MD  END OF SESSION:   OT End of Session - 01/19/23 1158     Visit Number 100    Number of Visits 144    Date for OT Re-Evaluation 04/13/23    Authorization Time Period Progress report period starting 0812/2024    OT Start Time 1100    OT Stop Time 1145    OT Time Calculation (min) 45 min    Equipment Utilized During Treatment powered wheelchair    Activity Tolerance Patient tolerated treatment well    Behavior During Therapy WFL for tasks assessed/performed                 Past Medical History:  Diagnosis Date   Acute blood loss anemia    Arthritis    Cancer (HCC)    skin   Central cord syndrome at C6 level of cervical spinal cord (HCC) 11/29/2017   Hypertension    Protein-calorie malnutrition, severe (HCC) 01/24/2018   S/P insertion of IVC (inferior vena caval) filter 01/24/2018   Tetraparesis (HCC)    Past Surgical History:  Procedure Laterality Date   ANTERIOR CERVICAL DECOMP/DISCECTOMY FUSION N/A 11/29/2017   Procedure: Cervical five-six, six-seven Anterior Cervical Decompression Fusion;  Surgeon: Jadene Pierini, MD;  Location: [facility] OR;  Service: Neurosurgery;  Laterality: N/A;  Cervical five-six, six-seven Anterior Cervical Decompression Fusion   CATARACT EXTRACTION     FEMUR IM NAIL Left 08/16/2021   Procedure: INTRAMEDULLARY (IM) NAIL FEMORAL;  Surgeon: Deeann Saint, MD;  Location: [facility] ORS;  Service: Orthopedics;  Laterality: Left;   IR IVC FILTER PLMT / S&I /IMG GUID/MOD SED  12/08/2017   Patient Active Problem List   Diagnosis Date Noted   Age-related osteoporosis without current pathological fracture 07/27/2022   Mild recurrent major depression (HCC) 07/27/2022   Hypertensive heart  disease with other congestive heart failure (HCC) 02/14/2022   Chronic indwelling Foley catheter 02/14/2022   Closed hip fracture (HCC) 08/15/2021   DVT (deep venous thrombosis) (HCC) 08/15/2021   Quadriplegia (HCC) 08/15/2021   Fall 08/15/2021   Constipation due to slow transit 08/31/2018   Trauma 06/05/2018   Neuropathic pain 06/05/2018   Neurogenic bowel 06/05/2018   Vaginal yeast infection 01/30/2018   Healthcare-associated pneumonia 01/25/2018   Chronic allergic rhinitis 01/24/2018   Depression with anxiety 01/24/2018   UTI due to Klebsiella species 01/24/2018   Protein-calorie malnutrition, severe (HCC) 01/24/2018   S/P insertion of IVC (inferior vena caval) filter 01/24/2018   Tetraparesis (HCC) 01/20/2018   Neurogenic bladder 01/20/2018   Reactive depression    Benign essential HTN    Acute postoperative anemia due to expected blood loss    Central cord syndrome at C6 level of cervical spinal cord (HCC) 11/29/2017   Allergy to alpha-gal 11/25/2016   SCC (squamous cell carcinoma) 04/08/2014   ONSET DATE: 11/29/2017  REFERRING DIAG: Central Cord Syndrome at C6 of the Cervical Spinal Cord, Fall with Bilateral Closed Hip Fractures, with ORIF repair with Intramedullary Nailing of the Left Hip Fracture.   THERAPY DIAG:  Muscle weakness (generalized)  Other lack of coordination  Rationale for Evaluation and Treatment: Rehabilitation  SUBJECTIVE:   SUBJECTIVE STATEMENT:  Pt reports  doing well today.  Pt accompanied by: self, Personal Care aide  PERTINENT  HISTORY: Central Cord Syndrome at C6 of the Cervical Spinal Cord, Fall with Bilateral Closed Hip Fractures, with ORIF repair with Intramedullary Nailing of the Left Hip Fracture.   PRECAUTIONS: None  WEIGHT BEARING RESTRICTIONS: No  PAIN:  Are you having pain? No pain, just shoulder stiffness   LIVING ENVIRONMENT: Lives with: [facility]  Stairs: No Has following equipment at home:  Wheelchair (power)  PLOF: Independent   PATIENT GOALS:  See below for established goals  OBJECTIVE:  Note: Objective measures were completed at Evaluation unless otherwise noted.  HAND DOMINANCE: Right  ADLs: Caregiver assist with ADLs, and [facility]  MOBILITY STATUS: Uses a power w/c  ACTIVITY TOLERANCE: Activity tolerance: Fair  FUNCTIONAL OUTCOME MEASURES:  Measurements:   12/09/2021:   Shoulder flexion: R: 120(136), L: 138(150) Shoulder abduction: R: 108(120), L: 110(120) Elbow: R: 0-148, L: 0-146 Wrist flexion: R: 55(62), L: 78 Writ extension: R: 34(55), L: 3(4) RD: R: 12(20), L: 16(26) UD: R: 8(24), L: 6(18) Thumb radial abduction: R: 11(20), L: 8(20) Digit flexion to the [facility]: R:  2nd: 5cm(3.5cm), 3rd: 7.5 cm(5cm), 4th: 7cm(3cm), 5th: 6cm(5.5cm) L: 2nd: 8cm(8cm), 3rd: 8cm(8cm), 4th: 7.5cm(7cm), 5th: 7cm(7cm)   02/03/2022:   Shoulder flexion: R: 122(138), L: 148(154) Shoulder abduction: R: 109(125), L: 125(125 Elbow: R: 0-148, L: 0-146 Wrist flexion: R: 60(68), L: 79 Writ extension: R: 40(55), L: 3(10) RD: R: 14(24), L: 20(28) UD: R: 8(24), L: 6(18) Thumb radial abduction: R: 20(25), L: 8(20) Digit flexion to the [facility]: R:  2nd: 4.5cm(3cm), 3rd: 6.5cm(4.5cm), 4th: 6.5 cm(3cm), 5th: 4.5cm(5cm) L: 2nd: 8cm(8cm), 3rd: 8cm(7cm), 4th: 7.5cm(7cm), 5th: 6.5cm(6.5cm)   04/07/2022:   Shoulder flexion: R: 125(150), L: 160(160) Shoulder abduction: R: 109(125), L: 125(125 Elbow: R: 0-148, L: 0-146 Wrist flexion: R: 60(68), L: 79 Writ extension: R: 40(55), L: 5(18) RD: R: 14(24), L: 20(28) UD: R: 18(24), L: 8(18) Thumb radial abduction: R: 20(25), L: 8(20) Digit flexion to the [facility]: R:  2nd: 4.5 cm(4 cm), 3rd: 6.5cm(3cm), 4th: 7 cm (5cm), 5th: 5cm(5cm) L: 2nd: 8cm(8cm), 3rd: 8cm(7cm), 4th: 7.5cm(7cm), 5th: 7cm(7cm)   07/05/2022:   Shoulder flexion: R: 125(154), L: 160(160) Shoulder abduction: R: 95(130), L: 143(143) Elbow: R: 0-148, L: 0-146 Wrist flexion:  R: 60(68), L: 79 Writ extension: R: 40(60), L: 0(12) RD: R: 10(24), L: 12(20) UD: R: 16(24), L: 8(16) Thumb radial abduction: R: 20(25), L: 8(20) Digit flexion to the [facility]: R:  2nd: 4 cm(4cm), 3rd: 6.5cm( 3.5cm), 4th: 7 cm (5cm), 5th: 6cm(5cm) L: 2nd: 8cm(8cm), 3rd: 8cm(7cm), 4th: 8cm(7cm), 5th: 7cm(7cm)   09/13/2022:   Shoulder flexion: R: 125(154), L: 160(160) Shoulder abduction: R: 100(130), L: 143(143) Elbow: R: 0-150, L: 0-150 Wrist flexion: R: 55(68), L: 79 Writ extension: R: 45(60), L: 0(12) RD: R: 10(24), L: 12(20) UD: R: 16(24), L: 8(16) Thumb radial abduction: R: 20(25), L: 8(20) Digit flexion to the [facility]: R:  2nd: 4 cm(4cm), 3rd: 6cm( 5cm), 4th: 6 cm (4cm), 5th: 5cm(5cm) L: 2nd: 7cm(cm), 3rd: 7cm(6cm), 4th: 7cm(6cm), 5th: 6cm(6cm)   10/27/2022   Shoulder flexion: R: 128(154), L: 160(160) Shoulder abduction: R: 105(130), L: 143(143) Elbow: R: 0-150, L: 0-150 Wrist flexion: R: 55(68), L: 79 Writ extension: R: 46(64), L: 0(20) RD: R: 10(20), L: 18(24) UD: R: 118(28), L: 8(20) Thumb radial abduction: R: 30(42), L:20(24) Digit flexion to the [facility]: R:  2nd: 4 cm(3cm), 3rd: 6cm( 6cm), 4th: 6 cm (5cm), 5th: 5cm(5cm) L: 2nd: 8cm(8cm), 3rd: 8cm(6cm), 4th: 8cm(7cm), 5th: 6cm(6cm)  01/19/2023  Shoulder flexion: R: 131(154), L: 160(160) Shoulder abduction: R: 110(130), L: 143(143) Elbow: R: 0-150, L: 0-150 Wrist flexion: R: 55(68), L: 79 Writ extension: R: 46(64), L: 0(20) RD: R: 10(20), L: 18(24) UD: R: 118(28), L: 14(20) Thumb radial abduction: R: 38(48), L:24(26) Thumb IP flexion: R: 35(50) L: 20(45) Digit flexion to the [facility]: R:  2nd: 3.5 cm(3cm), 3rd: 6cm( 6cm), 4th: 6 cm (5cm), 5th: 5cm(4.5cm) L: 2nd: 8cm(7.5cm), 3rd: 8cm(6cm), 4th: 7.5cm(6cm), 5th: 6cm(4.5cm)  COORDINATION:   01/19/2023:  9 hole peg test:  Right: 2 pegs placed in 1 min. & 5 sec.  Pt. was able to remove 9 pegs in 17 sec.  Left: 2 pegs placed in 1 min. & 10 sec. Pt. was able to remove 9 pegs  in 20 sec.  SENSATION: Intact  EDEMA: N/A   COGNITION: Overall cognitive status: WNL   VISION: Subjective report: Wears glasses, No changes in vision.  PERCEPTION: Intact  TODAY'S TREATMENT:                                                                                                                              DATE: 01/19/2023    Measurements were obtained, and goasl were reviewed with the Pt.   PATIENT EDUCATION: Education details: Bilateral hand function, ROM, and strengthening Person educated: Patient Education method: Explanation, Demonstration, and Verbal cues Education comprehension: verbalized understanding and returned demonstration  HOME EXERCISE PROGRAM: Continue to assess HEP needs, and provide as needed.   GOALS: Goals reviewed with patient? Yes  LONG TERM GOALS: Target date: 04/13/2023  3.  Patient will demonstrate improved composite finger flexion to be able to firmly hold and use adaptive devices during ADLs, and IADLs.  Baseline: 01/19/2023: Pt. Is improving with Bilateral digit flexion to the [facility]. Pt. has difficulty firmly holding adaptive devices. 12/06/2022: Pt. continues to present with increased MP, PIP, and DIP digit tightness/stiffness limiting the formulation of bilateral composites fists. 10/27/2022: Pt. presents with increased MP, PIP, and DIP digit tightness/stiffness limiting the formulation of bilateral composites fists during this progress reporting period limiting. Digit flexion to the [facility]: R:  2nd: 4 cm(3cm), 3rd: 6cm( 6cm), 4th: 6 cm (5cm), 5th: 5cm(5cm), L: 2nd: 8cm(8cm), 3rd: 8cm(6cm), 4th: 8cm(7cm), 5th: 6cm(6cm) 09/20/2022: Improving digit flexion to the [facility]. 09/13/22: improved digit flexion to the [facility]. 07/05/2022: Pt. presents with digit MP, PIP, and DIP extensor tightness limiting her ability to securely grip objects in her bilateral hands.  04/07/2022: Pt. Has improved with right 2nd, and 3rd digit flexion towards the [facility]. Pt. Continues to have  difficulty securely holding and applying deodorant.02/03/2022: Pt. Has improved with digit flexion, however, continues to have difficulty securely holding and using deodorant. 12/09/2021: Bilateral hand/digit MP, PIP, and DIP extension tightness limits her ability achieve digit flexion to hold, and apply deodorant. 10/28/2021:  Pt. continues to have difficulty holding the deodorant. 01/12/2021: Pt. Presents with limited digit extension. Pt. Is able to initiate holding deodorant, however  is unable to hold it while using it  Goal status: Ongoing   4.  Pt. Will improve bilateral wrist extension in preparation for anticipating, and initiating reaching for objects at the table.  Baseline: 01/19/2023:  R: 46(64) L: 0(20) 12/06/2022: Pt continues to to progress with bilateral wrist extension in preparation fro functional reaching. 10/27/2022: Pt. Is improving with bilateral wrist extension. R: 46(64) L: 0(20) 09/20/2022: limited PROM in the Left wrist extension 2/2 tightness/stiffness. 09/13/22: R: 55(68), L: 0(12) 07/19/2022: Bilateral wrist extension in limited. 07/05/2022: Right: 40(60) Left: 0(12) 04/07/2022: Right: 40(55) Left: 5(18) 02/03/2022: Right: 34(55) Left: 3(4) 12/09/2021: Right  34(55)  Left  3(4) 10/28/2021:  Right 22(38), Left 0(15) 09/14/2021:  Right: 17(35), left 2(15)  Goal status: Limited progress with wrist extension  this progress reporting period. Ongoing  7.  Pt. Will increase bilateral lateral pinch strength by 2 lbs to be able to securely grasp items during ADLs, and IADL tasks.  Baseline: 01/19/2023: Deferred as the Pinch meter is out for calibration.12/06/2022: Pt. Is able to more securely hold objects during ADLs/IADLs 10/27/2022: Pt. Is improving with holding items with her bilateral thumbs. (Pinch meter out for calibration) 09/20/2022: lateral pinch continues to be limited 09/13/22: R 6.5# L 4# 07/19/2022: TBD 07/05/2022: TBD 07/2022: NT-Pinch meter out for calibration. 02/03/2022: Right: 6#, Left:  4# 12/09/2021: Right: 6#, Left: 4#  Goal status: Deferred   9.  Pt. will complete plant care with modified independence.  Baseline:12/06/2022: Pt. Is independent with plant care in her husband's room, however is not able to access her plants due to furniture blocking access to them. 10/27/2022: Pt. Is able to water plants that are closer, and within reach. Pt. continues to have difficulty reaching for thorough plant care. 09/20/2022: Pt. Continues to have difficulty reaching for thorough plant care. 09/13/22: continues to report intermittent difficulty 07/19/2022: Pt. Continues to be able to water, and care for some of her plants. Pt. Has more difficulty with plants that are harder to reach. 07/05/2022: pt. Is able to water, and care for some of her plants. Pt. Has more difficulty with plants that are harder to reach. 04/07/2022: Pt. is now able to hold a cup and water her plants.02/03/2022: Pt. has difficulty caring for her plants.  Goal status:Achieved   10.  Pt. will demonstrate adaptive techniques to assist with the efficiency of self-dressing, or morning care tasks.  Baseline: 01/19/2203: Pt. Is able to donn her jacket in reverse independently. Pt. requires assist from staff, as Pt. Reports staff have decreased time. 12/06/2022: Pt. requires assist from staff, as Pt. Reports staff have decreased time. 10/27/2022: Pt. is able to assist with initiating UE dressing. Pt. requires assist from personal, and staff care aides. 09/20/2022: Pt. continues to require assist form personal, and staff care aides. 09/13/22: MODA dressing, reports she is often rushed 07/19/2022: Pt. continues to require assist with self-dressing/morning care tasks. 07/05/2022: Continue with goal. 04/07/2022: Pt. Continues to require assist with the efficiency of self-dressing, and morning care tasks.02/03/2022: Pt. requires assist from caregivers 2/2 time limitations during morning care.  Goal status: Ongoing  11.  Pt. will improve BUE strength to  improve ADL, and IADL functioning.  Baseline: 01/19/2023: L 4/5, R 3+/5 12/06/2022: Continue 10/27/2022: shoulder flexion L 4/5, R 3+/5; shoulder abduction L 4+/5, R 3+/5. elbow flexion B 5/5, elbow extension 4/5 09/20/2022: Pt. Presents with limited BUE strength 09/13/22: shoulder flexion L 4/5, R 3/5; elbow flexion B 5/5, elbow extension 4-/5; shoulder abduction L  4+/5, R 3+/5. 07/19/2022: BUE strength continues to be limited. 07/05/2022: shoulder flexion: right 4-/5, abduction: 3+/5, elbow flexion: right: 5/5, left 5/5, extension: right: 4-/5, left 4-/5, wrist extension: right: 3-/5, left: 2-/5  Goal status: Ongoing  12.  Pt. will improve bilateral thumb radial abduction in order to be able to hold the grab bars while standing with PT  Baseline: 01/19/2023: R: 38(48) L: 24(26)12/06/2022: Pt. Continues to present  with limited thumb abduction, however is improving holding onto the parallel bars.10/27/2022: thumb radial abduction: R: 30(42), L: 20(24  Goal status: Ongoing  13.  Pt. Will be able to securely hold, and stabilize medication bottles at a tabletop surface when opening, and closing them.  Baseline: 01/19/2023: Pt. Is now able to securely hold medication bottles while opening them, and stabilizes them on surfaces while opening them. 12/06/2022: Pt is improving with securely stabilizing medication bottles. 10/27/2022: Pt. Stabilizes bottles against her torso when attempting to open, and close them   Goal status: Achieved  14: Pt. Will improve bilateral thumb IP flexion to improve active grasping patterns.    Baseline: 01/19/2023: Right 35(50), Left: 20/45)    Goal status: New    15. Pt. Will improve bilateral FMC/speed, and dexterity. As evidence by improved scores on the 9 hole peg test.   Baseline: 01/19/2023: 2 pegs placed in 1 min. & 5 sec.  Pt. was able to remove 9 pegs in 17 sec.; Left: 2 pegs placed in 1 min. & 10    sec. Pt. was able to remove 9 pegs in 20 sec.   Goal status:  New      ASSESSMENT:  CLINICAL IMPRESSION:  Measurements were obtained, and goals were reviewed, upgraded, and modified with the Pt. Pt. Has made progress with bilateral digit flexion to the [facility], bilateral thumb radial abduction, and bilateral thumb IP flexion. Pt. has improved to be able to initiate grasping 2- 9 hole pegs from a horizontal position, and placing them vertically in pegboard. Pt. was able to remove all 9 pegs with each hand. Pt. uses a modified lateral grasp for the pegs. Pt. Is now able to  hold, stabilize, and open medication bottles. Pt. Is able to now donn her jacket in reverse consistently. Pt. continues to present with decreased strength, and limited bilateral hand ROM which continues to affect her performance, and engagement in daily ADLs, and IADL tasks.  Pt. Is highly motivated, and continues to benefit from working on impoving ROM, and UE strength in order to work towards increasing bilateral hand grasp on objects, and increasing engagement of bilateral hands during ADLs, and IADL tasks.    PERFORMANCE DEFICITS: in functional skills including ADLs, IADLs, coordination, dexterity, ROM, strength, and UE functional use, cognitive skills including , and psychosocial skills including coping strategies and environmental adaptation.   IMPAIRMENTS: are limiting patient from ADLs, IADLs, and leisure.   CO-MORBIDITIES: may have co-morbidities  that affects occupational performance. Patient will benefit from skilled OT to address above impairments and improve overall function.  MODIFICATION OR ASSISTANCE TO COMPLETE EVALUATION: Min-Moderate modification of tasks or assist with assess necessary to complete an evaluation.  OT OCCUPATIONAL PROFILE AND HISTORY: Detailed assessment: Review of records and additional review of physical, cognitive, psychosocial history related to current functional performance.  CLINICAL DECISION MAKING: Moderate - several treatment options, min-mod task  modification necessary  REHAB POTENTIAL: Good for stated goals  PLAN:  OT FREQUENCY 2x's a week  OT DURATION: 12 weeks  PLANNED INTERVENTIONS ADL training,  A/E training, UE ther. Ex, Manual therapy, neuromuscular re-education, moist heat modality, Paraffin Bath, Splinting, and  Pt./caregiver education   RECOMMENDED OTHER SERVICES: PT  CONSULTED AND AGREED WITH PLAN OF CARE: Patient  PLAN FOR NEXT SESSION: Continue Treatment as per established POC  Olegario Messier, MS, OTR/L  01/19/2023

## 2023-01-19 NOTE — Therapy (Signed)
OUTPATIENT PHYSICAL THERAPY NEURO TREATMENT  Patient Name: Sherry [name] MRN: [id number] DOB:Sep 07, 1934, 88 y.o., female Today's Date: 01/19/2023   PCP: Sherry Mealing, MD REFERRING PROVIDER: Deeann [name]   PT End of Session - 01/19/23 1152     Visit Number 92    Number of Visits 112    Date for PT Re-Evaluation 03/23/23    Authorization Type [employer] FOTO performed by PT on eval (7/20), score 12, Progress note on 03/10/2020; PN on 08/20/2020    Authorization Time Period Initial cert [id number]/2956-21/30/8657; Recert 01/11/2022-04/05/2022; Recert 04/07/2022-06/30/2022    Progress Note Due on Visit 100    PT Start Time 1148    PT Stop Time 1228    PT Time Calculation (min) 40 min    Equipment Utilized During Treatment Gait belt    Activity Tolerance Patient tolerated treatment well;No increased pain;Patient limited by fatigue    Behavior During Therapy [facility] for tasks assessed/performed                          Past Medical History:  Diagnosis Date   Acute blood loss anemia    Arthritis    Cancer (HCC)    skin   Central cord syndrome at C6 level of cervical spinal cord (HCC) 11/29/2017   Hypertension    Protein-calorie malnutrition, severe (HCC) 01/24/2018   S/P insertion of IVC (inferior vena caval) filter 01/24/2018   Tetraparesis (HCC)    Past Surgical History:  Procedure Laterality Date   ANTERIOR CERVICAL DECOMP/DISCECTOMY FUSION N/A 11/29/2017   Procedure: Cervical five-six, six-seven Anterior Cervical Decompression Fusion;  Surgeon: Sherry Pierini, MD;  Location: [facility] OR;  Service: Neurosurgery;  Laterality: N/A;  Cervical five-six, six-seven Anterior Cervical Decompression Fusion   CATARACT EXTRACTION     FEMUR IM NAIL Left 08/16/2021   Procedure: INTRAMEDULLARY (IM) NAIL FEMORAL;  Surgeon: Sherry Saint, MD;  Location: [facility] ORS;  Service: Orthopedics;  Laterality: Left;   IR IVC FILTER PLMT / S&I /IMG GUID/MOD SED  12/08/2017   Patient Active  Problem List   Diagnosis Date Noted   Age-related osteoporosis without current pathological fracture 07/27/2022   Mild recurrent major depression (HCC) 07/27/2022   Hypertensive heart disease with other congestive heart failure (HCC) 02/14/2022   Chronic indwelling Foley catheter 02/14/2022   Closed hip fracture (HCC) 08/15/2021   DVT (deep venous thrombosis) (HCC) 08/15/2021   Quadriplegia (HCC) 08/15/2021   Fall 08/15/2021   Constipation due to slow transit 08/31/2018   Trauma 06/05/2018   Neuropathic pain 06/05/2018   Neurogenic bowel 06/05/2018   Vaginal yeast infection 01/30/2018   Healthcare-associated pneumonia 01/25/2018   Chronic allergic rhinitis 01/24/2018   Depression with anxiety 01/24/2018   UTI due to Klebsiella species 01/24/2018   Protein-calorie malnutrition, severe (HCC) 01/24/2018   S/P insertion of IVC (inferior vena caval) filter 01/24/2018   Tetraparesis (HCC) 01/20/2018   Neurogenic bladder 01/20/2018   Reactive depression    Benign essential HTN    Acute postoperative anemia due to expected blood loss    Central cord syndrome at C6 level of cervical spinal cord (HCC) 11/29/2017   Allergy to alpha-gal 11/25/2016   SCC (squamous cell carcinoma) 04/08/2014    ONSET DATE: 08/15/2021 (fall with B hip fx); Initial injury was in 2019- quadraplegia due to central cord syndrome of C6 secondary to neck fracture.   REFERRING DIAG: Bilateral Hip fx; Left Open- s/p Left IM nail femoral on 08/16/2021; Right closed  THERAPY DIAG:  Muscle weakness (generalized)  Other lack of coordination  Central cord syndrome, sequela (HCC)  Other abnormalities of gait and mobility  Difficulty in walking, not elsewhere classified  Closed fracture of both hips, initial encounter (HCC)  Abnormality of gait and mobility  Rationale for Evaluation and Treatment Rehabilitation  SUBJECTIVE:   SUBJECTIVE STATEMENT:  Patient reports missing Monday due to not feeling well. She  reports no pain but feeling "Blah" today.        Pt accompanied by:  Paid caregiver-   PERTINENT HISTORY: Sherry [name] is an 87yoF who experienced a fall at home on 08/15/2021 with hip fracture, s/p Left hip ORIF. PMH: quadriplegia due to central cord syndrome of C6 secondary to neck fracture, HTN, depression with anxiety, neurogenic bladder, bilateral DVT on Xarelto (s/p of IVC filter placement). Prior history of significant PT/OT with improvement of function. Pt has full use of shoulder/elbows, limited use of hands, adaptive self feeding sucessful.     PAIN:  None current but does endorse some left thigh pain intermittent  OBJECTIVE:    TODAY'S TREATMENT:  01/19/23           THEREX: all performed at [facility] today to also focus on core control  -Active Seated marches- 2 x 15 reps each LE -Seated LAQ 2x15 each eachLE - Seated Hip abd (AAROM) 2 x 15 reps each LE - Seated hip ext (RTB) flexed knee assisted up into march then patient pushes foot back down toward floor 2 x 15 reps.  - AAROM hip abd each LE x 10 reps  -Seated ham curl RTB (minimal ability on right and partial ROM on left) 2 x 15 reps - Manual resistive Leg press (2 sets of 10 reps each LE)                  PATIENT EDUCATION: Education details: cues for isolated joint exercises  HOME EXERCISE PROGRAM: No changes at this time  Access Code: [facility] URL: [email or web address] Date: 11/23/2021 Prepared by: Sherry [name] Exercises - Seated Gluteal Sets - 2 x daily - 7 x weekly - 2 sets - 10 reps - 5 hold - Seated Quad Set - 2 x daily - 7 x weekly - 2 sets - 10 reps - 5 hold - Seated Long Arc Quad - 2 x daily - 7 x weekly - 2 sets - 10 reps - 5 hold - Seated March - 2 x daily - 7 x weekly - 2 sets - 10 reps - 5 hold - Seated Hip Abduction - 2 x daily - 7 x weekly - 2 sets - 10 reps - 5 hold - Seated Shoulder Shrugs - 2 x daily - 7 x weekly - 2 sets - 10 reps - 5 hold - Wheelchair Pressure Relief  - 2 x daily - 7 x weekly - 2 sets - 10 reps - 5 hold  GOALS: Goals reviewed with patient? Yes  SHORT TERM GOALS: Target date: 02/22/2022  Pt will be independent with initial UE strengthening HEP in order to improve strength and balance in order to decrease fall risk and improve function at home and work. Baseline: 10/19/2021- patient with no formal UE HEP; 12/02/2021= Patient verbalized knowledge of HEP including use of theraband for UE strengthening.  Goal status: GOAL MET  LONG TERM GOALS: Target date: 03/23/2023  Pt will be independent with final for UE/LE HEP in order to improve strength and balance in order to decrease fall risk and improve  function at home and work. Baseline: Patient is currently BLE NWB and unable to participate in HEP. Has order for UE strengthening. 01/11/2022- Patient now able to participate in LE strengthening although NWB still- good understanding for some basic exercises. Will keep goal active to incorporate progressive LE strengthening exercises. 04/07/2022= Patient still participating in progressive LE seated HEP and has no questions at this time.  Goal status: MET  2.  Pt will improve FOTO to target score of 35  to display perceived improvements in ability to complete ADL's.  Baseline: 10/19/2021= 12; 12/02/2021= 17; 01/11/2022=17; 04/07/2022= 17; 07/05/2022=17; 10/06/2022- outcome measure not appropriate as patient in non-ambulatory and not consistently standing. Goal status: Goal not appropriate  3.  Pt will increase strength of B UE  by at least 1/2 MMT grade in order to demonstrate improvement in strength and function  Baseline: patient has range of 2-/5 to 4/5 BUE Strength; 12/02/2021= 4/5 except with wrist ext. 01/11/2022= 4/5 B UE strength expept for wrist Goal status: Goal revised-no longer appropriate- working with OT on all UE strengthening.   4. Pt. Will increase strength of RLE by at least 1/2 MMT grade in order to demonstrate improvement in  Standing/transfers.  Baseline: 2-/5 Right hip flex/knee ext/flex; 04/07/2022= 2- with right hip flex/knee ext (lacking 28 deg from zero) 4/8= Patient able to ext right knee lacking 18 deg from zero. 07/05/2022=Left knee approx 8 deg from zero and right knee = 23 deg from zero; 12/29/2022 - Left LE = full ROM 4-/5 and right knee ext= 23 deg from zero= 2+/5 (gravity minimized)   Goal status: Progressing  5. Pt. Will demo ability to stand pivot transfer with max assist for improved functional mobility and less dependent need on mechanical device.   Baseline: Dependent on hoyer lift for all transfers; 04/07/2022- Unable to test secondary to patient with recent UTI and right hand procedure - will attempt to reassess next visit. ; 04/12/22: Unable to successfully stand due to feet plates on loaned power w/c; 05/10/2022- Patient still having to use rental chair and has not received her original power w/c back to practice SPT. 07/04/2021= Patient just received new power w/c and attempted standing from 1st time today- Patient able to stand with max assist today from w/c and did not attempt to perform pivot transfer- difficulty with static standing and placing weight on right LE. 10/06/2022- Not assessed today- patient needs new AFO prior to attempting transfers; 12/29/2022= Patient has not attempted SPT yet - still trying to improve LE strength enough to stand well without knee buckling with weight shifting.   Goal status: ONGOING  6. Pt. Will demonstrate improved functional LE strength as seen by ability to stand > 2 min for improved transfer ability and pregait abilities.   Baseline: not assessed today due to recent dx: UTI; 04/07/2022- Unable to test secondary to patient with recent UTI and right hand procedure - will attempt to reassess next visit; 04/12/22: Unable to successfully stand due to feet plates on loaned power w/c 05/10/2022- Patient still having to use rental chair and has not received her original power w/c back to  practice Standing. 07/05/2022- Patient able to stand with max assist at trunk today for 2 min 3 sec. Will keep goal active to ensure consistency. 10/06/2022= Unable to assess today- patient with some recent blood pressure issues and needs new AFO. 11/24/2022= Patient demonstrated multiple rounds of standing in // bars- holding up to 1:45 min today but has been able to  exhibit 2 min in recent past- working on consistency. 12/29/2022= 2 min 40 sec with patient demonstrating improved overall posture and ability to contract glutes and quads with just min assist at trunk today.   Goal status: MET  7. Pt. Will demonstrate improved functional LE strength as seen by ability to stand > 5 min for improved transfer ability and pregait abilities. Baseline: 12/29/2022= 2 min 40 sec with patient demonstrating improved overall posture and ability to contract glutes and quads with just min assist at trunk today.  Goal Status: NEW  8. Patient will perform sit to stand transfer with moderate assist consistently > 75% of time for improved transfer ability and less dependence on caregiver.   Baseline: 12/29/2022- Patient currently max assist level assistance with all sit to stand activities.   Goal status: New   ASSESSMENT:  CLINICAL IMPRESSION: Treatment limited to LE strengthening as no caregiver present to assist in standing. She demonstrated fatigue but able to complete 2 sets of 15 reps with either active or active assistive exercises. She was motivated to push through even with reporting not feeling great today. Very minimal rest breaks to focus some more on muscle endurance.  Pt will continue to benefit from skilled PT services to optimize independence and reduced caregiver support.    OBJECTIVE IMPAIRMENTS Abnormal gait, decreased activity tolerance, decreased balance, decreased coordination, decreased endurance, decreased mobility, difficulty walking, decreased ROM, decreased strength, hypomobility, impaired  flexibility, impaired UE functional use, postural dysfunction, and pain.   ACTIVITY LIMITATIONS carrying, lifting, bending, sitting, standing, squatting, sleeping, stairs, transfers, bed mobility, continence, bathing, toileting, dressing, self feeding, reach over head, hygiene/grooming, and caring for others  PARTICIPATION LIMITATIONS: meal prep, cleaning, laundry, medication management, personal finances, interpersonal relationship, driving, shopping, community activity, and yard work  PERSONAL FACTORS Age, Time since onset of injury/illness/exacerbation, and 1-2 comorbidities: HTN, cervical Sx  are also affecting patient's functional outcome.   REHAB POTENTIAL: Good  CLINICAL DECISION MAKING: Evolving/moderate complexity  EVALUATION COMPLEXITY: Moderate  PLAN: PT FREQUENCY: 1-2x/week  PT DURATION: 12 weeks  PLANNED INTERVENTIONS: Therapeutic exercises, Therapeutic activity, Neuromuscular re-education, Balance training, Gait training, Patient/Family education, Self Care, Joint mobilization, DME instructions, Dry Needling, Electrical stimulation, Wheelchair mobility training, Spinal mobilization, Cryotherapy, Moist heat, and Manual therapy  PLAN FOR NEXT SESSION:  LE and core strength training, Sit to stand and static standing; progress to SPT as able.     1:44 PM, 01/19/23 Louis Meckel, PT Physical Therapist - [facility]  Outpatient Physical Therapy- Main Campus [phone]

## 2023-01-24 ENCOUNTER — Ambulatory Visit: Payer: Medicare HMO | Admitting: Occupational Therapy

## 2023-01-24 ENCOUNTER — Ambulatory Visit: Payer: Medicare HMO

## 2023-01-31 ENCOUNTER — Ambulatory Visit: Payer: Medicare HMO

## 2023-01-31 ENCOUNTER — Ambulatory Visit: Payer: Medicare HMO | Admitting: Occupational Therapy

## 2023-01-31 DIAGNOSIS — R278 Other lack of coordination: Secondary | ICD-10-CM

## 2023-01-31 DIAGNOSIS — S14129S Central cord syndrome at unspecified level of cervical spinal cord, sequela: Secondary | ICD-10-CM

## 2023-01-31 DIAGNOSIS — R2689 Other abnormalities of gait and mobility: Secondary | ICD-10-CM

## 2023-01-31 DIAGNOSIS — R262 Difficulty in walking, not elsewhere classified: Secondary | ICD-10-CM

## 2023-01-31 DIAGNOSIS — M6281 Muscle weakness (generalized): Secondary | ICD-10-CM

## 2023-01-31 NOTE — Therapy (Signed)
OUTPATIENT PHYSICAL THERAPY NEURO TREATMENT  Patient Name: Sherry [name] MRN: [id number] DOB:05/07/1934, 88 y.o., female Today's Date: 01/31/2023   PCP: Earnestine Mealing, MD REFERRING PROVIDER: Deeann Saint   PT End of Session - 01/31/23 1127     Visit Number 93    Number of Visits 112    Date for PT Re-Evaluation 03/23/23    Authorization Type [employer] FOTO performed by PT on eval (7/20), score 12, Progress note on 03/10/2020; PN on 08/20/2020    Authorization Time Period Initial cert 06/29/4130-44/02/270; Recert 01/11/2022-04/05/2022; Recert 04/07/2022-06/30/2022    Progress Note Due on Visit 100    PT Start Time 1146    PT Stop Time 1228    PT Time Calculation (min) 42 min    Equipment Utilized During Treatment Gait belt    Activity Tolerance Patient tolerated treatment well;No increased pain;Patient limited by fatigue    Behavior During Therapy [facility] for tasks assessed/performed             Past Medical History:  Diagnosis Date   Acute blood loss anemia    Arthritis    Cancer (HCC)    skin   Central cord syndrome at C6 level of cervical spinal cord (HCC) 11/29/2017   Hypertension    Protein-calorie malnutrition, severe (HCC) 01/24/2018   S/P insertion of IVC (inferior vena caval) filter 01/24/2018   Tetraparesis (HCC)    Past Surgical History:  Procedure Laterality Date   ANTERIOR CERVICAL DECOMP/DISCECTOMY FUSION N/A 11/29/2017   Procedure: Cervical five-six, six-seven Anterior Cervical Decompression Fusion;  Surgeon: Jadene Pierini, MD;  Location: [facility] OR;  Service: Neurosurgery;  Laterality: N/A;  Cervical five-six, six-seven Anterior Cervical Decompression Fusion   CATARACT EXTRACTION     FEMUR IM NAIL Left 08/16/2021   Procedure: INTRAMEDULLARY (IM) NAIL FEMORAL;  Surgeon: Deeann Saint, MD;  Location: [facility] ORS;  Service: Orthopedics;  Laterality: Left;   IR IVC FILTER PLMT / S&I /IMG GUID/MOD SED  12/08/2017   Patient Active Problem List   Diagnosis  Date Noted   Age-related osteoporosis without current pathological fracture 07/27/2022   Mild recurrent major depression (HCC) 07/27/2022   Hypertensive heart disease with other congestive heart failure (HCC) 02/14/2022   Chronic indwelling Foley catheter 02/14/2022   Closed hip fracture (HCC) 08/15/2021   DVT (deep venous thrombosis) (HCC) 08/15/2021   Quadriplegia (HCC) 08/15/2021   Fall 08/15/2021   Constipation due to slow transit 08/31/2018   Trauma 06/05/2018   Neuropathic pain 06/05/2018   Neurogenic bowel 06/05/2018   Vaginal yeast infection 01/30/2018   Healthcare-associated pneumonia 01/25/2018   Chronic allergic rhinitis 01/24/2018   Depression with anxiety 01/24/2018   UTI due to Klebsiella species 01/24/2018   Protein-calorie malnutrition, severe (HCC) 01/24/2018   S/P insertion of IVC (inferior vena caval) filter 01/24/2018   Tetraparesis (HCC) 01/20/2018   Neurogenic bladder 01/20/2018   Reactive depression    Benign essential HTN    Acute postoperative anemia due to expected blood loss    Central cord syndrome at C6 level of cervical spinal cord (HCC) 11/29/2017   Allergy to alpha-gal 11/25/2016   SCC (squamous cell carcinoma) 04/08/2014    ONSET DATE: 08/15/2021 (fall with B hip fx); Initial injury was in 2019- quadraplegia due to central cord syndrome of C6 secondary to neck fracture.   REFERRING DIAG: Bilateral Hip fx; Left Open- s/p Left IM nail femoral on 08/16/2021; Right closed  THERAPY DIAG:  Muscle weakness (generalized)  Other lack of coordination  Central cord syndrome, sequela (HCC)  Other abnormalities of gait and mobility  Difficulty in walking, not elsewhere classified  Rationale for Evaluation and Treatment Rehabilitation  SUBJECTIVE:   SUBJECTIVE STATEMENT:   Patient reports no new complaints or changes since last session.     Pt accompanied by:  Paid caregiver-   PERTINENT HISTORY: Sherry [name] is an 87yoF who experienced a  fall at home on 08/15/2021 with hip fracture, s/p Left hip ORIF. PMH: quadriplegia due to central cord syndrome of C6 secondary to neck fracture, HTN, depression with anxiety, neurogenic bladder, bilateral DVT on Xarelto (s/p of IVC filter placement). Prior history of significant PT/OT with improvement of function. Pt has full use of shoulder/elbows, limited use of hands, adaptive self feeding sucessful.     PAIN:  None current but does endorse some left thigh pain intermittent  OBJECTIVE:    TODAY'S TREATMENT:  01/31/23     THEREX: all performed power w/c today   -Active assisted seated marching 2 x 15 each LE -Seated LAQ 2 x 15 each LE  -Reclined quad sets with 5 sec hold each x 15 reps each LE -Reclined ankle DF/PF 2 x 15 each LE  -Reclined hip abduction 2 x 15 each LE  -Reclined hip adduction ball squeeze 2 x 15  -Manual resistive leg press 2 x 10   PATIENT EDUCATION: Education details: cues for isolated joint exercises  HOME EXERCISE PROGRAM: No changes at this time  Access Code: [facility] URL: [email or web address] Date: 11/23/2021 Prepared by: Precious Bard Exercises - Seated Gluteal Sets - 2 x daily - 7 x weekly - 2 sets - 10 reps - 5 hold - Seated Quad Set - 2 x daily - 7 x weekly - 2 sets - 10 reps - 5 hold - Seated Long Arc Quad - 2 x daily - 7 x weekly - 2 sets - 10 reps - 5 hold - Seated March - 2 x daily - 7 x weekly - 2 sets - 10 reps - 5 hold - Seated Hip Abduction - 2 x daily - 7 x weekly - 2 sets - 10 reps - 5 hold - Seated Shoulder Shrugs - 2 x daily - 7 x weekly - 2 sets - 10 reps - 5 hold - Wheelchair Pressure Relief - 2 x daily - 7 x weekly - 2 sets - 10 reps - 5 hold  GOALS: Goals reviewed with patient? Yes  SHORT TERM GOALS: Target date: 02/22/2022  Pt will be independent with initial UE strengthening HEP in order to improve strength and balance in order to decrease fall risk and improve function at home and work. Baseline: 10/19/2021- patient with  no formal UE HEP; 12/02/2021= Patient verbalized knowledge of HEP including use of theraband for UE strengthening.  Goal status: GOAL MET  LONG TERM GOALS: Target date: 03/23/2023  Pt will be independent with final for UE/LE HEP in order to improve strength and balance in order to decrease fall risk and improve function at home and work. Baseline: Patient is currently BLE NWB and unable to participate in HEP. Has order for UE strengthening. 01/11/2022- Patient now able to participate in LE strengthening although NWB still- good understanding for some basic exercises. Will keep goal active to incorporate progressive LE strengthening exercises. 04/07/2022= Patient still participating in progressive LE seated HEP and has no questions at this time.  Goal status: MET  2.  Pt will improve FOTO to target score of 35  to display perceived  improvements in ability to complete ADL's.  Baseline: 10/19/2021= 12; 12/02/2021= 17; 01/11/2022=17; 04/07/2022= 17; 07/05/2022=17; 10/06/2022- outcome measure not appropriate as patient in non-ambulatory and not consistently standing. Goal status: Goal not appropriate  3.  Pt will increase strength of B UE  by at least 1/2 MMT grade in order to demonstrate improvement in strength and function  Baseline: patient has range of 2-/5 to 4/5 BUE Strength; 12/02/2021= 4/5 except with wrist ext. 01/11/2022= 4/5 B UE strength expept for wrist Goal status: Goal revised-no longer appropriate- working with OT on all UE strengthening.   4. Pt. Will increase strength of RLE by at least 1/2 MMT grade in order to demonstrate improvement in Standing/transfers.  Baseline: 2-/5 Right hip flex/knee ext/flex; 04/07/2022= 2- with right hip flex/knee ext (lacking 28 deg from zero) 4/8= Patient able to ext right knee lacking 18 deg from zero. 07/05/2022=Left knee approx 8 deg from zero and right knee = 23 deg from zero; 12/29/2022 - Left LE = full ROM 4-/5 and right knee ext= 23 deg from zero= 2+/5 (gravity  minimized)   Goal status: Progressing  5. Pt. Will demo ability to stand pivot transfer with max assist for improved functional mobility and less dependent need on mechanical device.   Baseline: Dependent on hoyer lift for all transfers; 04/07/2022- Unable to test secondary to patient with recent UTI and right hand procedure - will attempt to reassess next visit. ; 04/12/22: Unable to successfully stand due to feet plates on loaned power w/c; 05/10/2022- Patient still having to use rental chair and has not received her original power w/c back to practice SPT. 07/04/2021= Patient just received new power w/c and attempted standing from 1st time today- Patient able to stand with max assist today from w/c and did not attempt to perform pivot transfer- difficulty with static standing and placing weight on right LE. 10/06/2022- Not assessed today- patient needs new AFO prior to attempting transfers; 12/29/2022= Patient has not attempted SPT yet - still trying to improve LE strength enough to stand well without knee buckling with weight shifting.   Goal status: ONGOING  6. Pt. Will demonstrate improved functional LE strength as seen by ability to stand > 2 min for improved transfer ability and pregait abilities.   Baseline: not assessed today due to recent dx: UTI; 04/07/2022- Unable to test secondary to patient with recent UTI and right hand procedure - will attempt to reassess next visit; 04/12/22: Unable to successfully stand due to feet plates on loaned power w/c 05/10/2022- Patient still having to use rental chair and has not received her original power w/c back to practice Standing. 07/05/2022- Patient able to stand with max assist at trunk today for 2 min 3 sec. Will keep goal active to ensure consistency. 10/06/2022= Unable to assess today- patient with some recent blood pressure issues and needs new AFO. 11/24/2022= Patient demonstrated multiple rounds of standing in // bars- holding up to 1:45 min today but has been able  to exhibit 2 min in recent past- working on consistency. 12/29/2022= 2 min 40 sec with patient demonstrating improved overall posture and ability to contract glutes and quads with just min assist at trunk today.   Goal status: MET  7. Pt. Will demonstrate improved functional LE strength as seen by ability to stand > 5 min for improved transfer ability and pregait abilities. Baseline: 12/29/2022= 2 min 40 sec with patient demonstrating improved overall posture and ability to contract glutes and quads with just min  assist at trunk today.  Goal Status: NEW  8. Patient will perform sit to stand transfer with moderate assist consistently > 75% of time for improved transfer ability and less dependence on caregiver.   Baseline: 12/29/2022- Patient currently max assist level assistance with all sit to stand activities.   Goal status: New   ASSESSMENT:  CLINICAL IMPRESSION:   Patient arrives to treatment session motivated to participate and feeling well. Session focused on seated exercises in power w/c. Tolerated treatment session well. Pt will continue to benefit from skilled PT services to optimize independence and reduced caregiver support.    OBJECTIVE IMPAIRMENTS Abnormal gait, decreased activity tolerance, decreased balance, decreased coordination, decreased endurance, decreased mobility, difficulty walking, decreased ROM, decreased strength, hypomobility, impaired flexibility, impaired UE functional use, postural dysfunction, and pain.   ACTIVITY LIMITATIONS carrying, lifting, bending, sitting, standing, squatting, sleeping, stairs, transfers, bed mobility, continence, bathing, toileting, dressing, self feeding, reach over head, hygiene/grooming, and caring for others  PARTICIPATION LIMITATIONS: meal prep, cleaning, laundry, medication management, personal finances, interpersonal relationship, driving, shopping, community activity, and yard work  PERSONAL FACTORS Age, Time since onset of  injury/illness/exacerbation, and 1-2 comorbidities: HTN, cervical Sx  are also affecting patient's functional outcome.   REHAB POTENTIAL: Good  CLINICAL DECISION MAKING: Evolving/moderate complexity  EVALUATION COMPLEXITY: Moderate  PLAN: PT FREQUENCY: 1-2x/week  PT DURATION: 12 weeks  PLANNED INTERVENTIONS: Therapeutic exercises, Therapeutic activity, Neuromuscular re-education, Balance training, Gait training, Patient/Family education, Self Care, Joint mobilization, DME instructions, Dry Needling, Electrical stimulation, Wheelchair mobility training, Spinal mobilization, Cryotherapy, Moist heat, and Manual therapy  PLAN FOR NEXT SESSION:  LE and core strength training, Sit to stand and static standing; progress to SPT as able.     11:28 AM, 01/31/23 Maylon Peppers, PT, DPT  Physical Therapist - [facility]  Outpatient Physical Therapy- Main Campus [phone]

## 2023-01-31 NOTE — Therapy (Signed)
Occupational Therapy Progress/Recertification Note  Dates of reporting period  12/06/2022   to   01/19/2023    Patient Name: QUINTERRA HENKELMAN MRN: [id number] DOB:07-08-1934, 88 y.o., female Today's Date: 11/29/2022  PCP:  Cindi Carbon PROVIDER: Earnestine Mealing, MD  END OF SESSION:   OT End of Session - 01/31/23 1423     Visit Number 101    Number of Visits 168    Date for OT Re-Evaluation 04/13/23    Authorization Time Period Progress report period starting 0812/2024    OT Start Time 1103    OT Stop Time 1145    OT Time Calculation (min) 42 min    Activity Tolerance Patient tolerated treatment well    Behavior During Therapy [facility] for tasks assessed/performed                 Past Medical History:  Diagnosis Date   Acute blood loss anemia    Arthritis    Cancer (HCC)    skin   Central cord syndrome at C6 level of cervical spinal cord (HCC) 11/29/2017   Hypertension    Protein-calorie malnutrition, severe (HCC) 01/24/2018   S/P insertion of IVC (inferior vena caval) filter 01/24/2018   Tetraparesis (HCC)    Past Surgical History:  Procedure Laterality Date   ANTERIOR CERVICAL DECOMP/DISCECTOMY FUSION N/A 11/29/2017   Procedure: Cervical five-six, six-seven Anterior Cervical Decompression Fusion;  Surgeon: Jadene Pierini, MD;  Location: [facility] OR;  Service: Neurosurgery;  Laterality: N/A;  Cervical five-six, six-seven Anterior Cervical Decompression Fusion   CATARACT EXTRACTION     FEMUR IM NAIL Left 08/16/2021   Procedure: INTRAMEDULLARY (IM) NAIL FEMORAL;  Surgeon: Deeann Saint, MD;  Location: [facility] ORS;  Service: Orthopedics;  Laterality: Left;   IR IVC FILTER PLMT / S&I /IMG GUID/MOD SED  12/08/2017   Patient Active Problem List   Diagnosis Date Noted   Age-related osteoporosis without current pathological fracture 07/27/2022   Mild recurrent major depression (HCC) 07/27/2022   Hypertensive heart disease with other congestive heart failure (HCC) 02/14/2022    Chronic indwelling Foley catheter 02/14/2022   Closed hip fracture (HCC) 08/15/2021   DVT (deep venous thrombosis) (HCC) 08/15/2021   Quadriplegia (HCC) 08/15/2021   Fall 08/15/2021   Constipation due to slow transit 08/31/2018   Trauma 06/05/2018   Neuropathic pain 06/05/2018   Neurogenic bowel 06/05/2018   Vaginal yeast infection 01/30/2018   Healthcare-associated pneumonia 01/25/2018   Chronic allergic rhinitis 01/24/2018   Depression with anxiety 01/24/2018   UTI due to Klebsiella species 01/24/2018   Protein-calorie malnutrition, severe (HCC) 01/24/2018   S/P insertion of IVC (inferior vena caval) filter 01/24/2018   Tetraparesis (HCC) 01/20/2018   Neurogenic bladder 01/20/2018   Reactive depression    Benign essential HTN    Acute postoperative anemia due to expected blood loss    Central cord syndrome at C6 level of cervical spinal cord (HCC) 11/29/2017   Allergy to alpha-gal 11/25/2016   SCC (squamous cell carcinoma) 04/08/2014   ONSET DATE: 11/29/2017  REFERRING DIAG: Central Cord Syndrome at C6 of the Cervical Spinal Cord, Fall with Bilateral Closed Hip Fractures, with ORIF repair with Intramedullary Nailing of the Left Hip Fracture.   THERAPY DIAG:  Muscle weakness (generalized)  Other lack of coordination  Rationale for Evaluation and Treatment: Rehabilitation  SUBJECTIVE:   SUBJECTIVE STATEMENT:  Pt reports  doing well today.  Pt accompanied by: self, Personal Care aide  PERTINENT HISTORY: Central Cord Syndrome at C6 of the Cervical  Spinal Cord, Fall with Bilateral Closed Hip Fractures, with ORIF repair with Intramedullary Nailing of the Left Hip Fracture.   PRECAUTIONS: None  WEIGHT BEARING RESTRICTIONS: No  PAIN:  Are you having pain? No pain, just shoulder stiffness   LIVING ENVIRONMENT: Lives with: [facility]  Stairs: No Has following equipment at home: Wheelchair (power)  PLOF: Independent   PATIENT GOALS:  See below  for established goals  OBJECTIVE:  Note: Objective measures were completed at Evaluation unless otherwise noted.  HAND DOMINANCE: Right  ADLs: Caregiver assist with ADLs, and [facility]  MOBILITY STATUS: Uses a power w/c  ACTIVITY TOLERANCE: Activity tolerance: Fair  FUNCTIONAL OUTCOME MEASURES:  Measurements:   12/09/2021:   Shoulder flexion: R: 120(136), L: 138(150) Shoulder abduction: R: 108(120), L: 110(120) Elbow: R: 0-148, L: 0-146 Wrist flexion: R: 55(62), L: 78 Writ extension: R: 34(55), L: 3(4) RD: R: 12(20), L: 16(26) UD: R: 8(24), L: 6(18) Thumb radial abduction: R: 11(20), L: 8(20) Digit flexion to the [facility]: R:  2nd: 5cm(3.5cm), 3rd: 7.5 cm(5cm), 4th: 7cm(3cm), 5th: 6cm(5.5cm) L: 2nd: 8cm(8cm), 3rd: 8cm(8cm), 4th: 7.5cm(7cm), 5th: 7cm(7cm)   02/03/2022:   Shoulder flexion: R: 122(138), L: 148(154) Shoulder abduction: R: 109(125), L: 125(125 Elbow: R: 0-148, L: 0-146 Wrist flexion: R: 60(68), L: 79 Writ extension: R: 40(55), L: 3(10) RD: R: 14(24), L: 20(28) UD: R: 8(24), L: 6(18) Thumb radial abduction: R: 20(25), L: 8(20) Digit flexion to the [facility]: R:  2nd: 4.5cm(3cm), 3rd: 6.5cm(4.5cm), 4th: 6.5 cm(3cm), 5th: 4.5cm(5cm) L: 2nd: 8cm(8cm), 3rd: 8cm(7cm), 4th: 7.5cm(7cm), 5th: 6.5cm(6.5cm)   04/07/2022:   Shoulder flexion: R: 125(150), L: 160(160) Shoulder abduction: R: 109(125), L: 125(125 Elbow: R: 0-148, L: 0-146 Wrist flexion: R: 60(68), L: 79 Writ extension: R: 40(55), L: 5(18) RD: R: 14(24), L: 20(28) UD: R: 18(24), L: 8(18) Thumb radial abduction: R: 20(25), L: 8(20) Digit flexion to the [facility]: R:  2nd: 4.5 cm(4 cm), 3rd: 6.5cm(3cm), 4th: 7 cm (5cm), 5th: 5cm(5cm) L: 2nd: 8cm(8cm), 3rd: 8cm(7cm), 4th: 7.5cm(7cm), 5th: 7cm(7cm)   07/05/2022:   Shoulder flexion: R: 125(154), L: 160(160) Shoulder abduction: R: 95(130), L: 143(143) Elbow: R: 0-148, L: 0-146 Wrist flexion: R: 60(68), L: 79 Writ extension: R: 40(60), L: 0(12) RD: R: 10(24),  L: 12(20) UD: R: 16(24), L: 8(16) Thumb radial abduction: R: 20(25), L: 8(20) Digit flexion to the [facility]: R:  2nd: 4 cm(4cm), 3rd: 6.5cm( 3.5cm), 4th: 7 cm (5cm), 5th: 6cm(5cm) L: 2nd: 8cm(8cm), 3rd: 8cm(7cm), 4th: 8cm(7cm), 5th: 7cm(7cm)   09/13/2022:   Shoulder flexion: R: 125(154), L: 160(160) Shoulder abduction: R: 100(130), L: 143(143) Elbow: R: 0-150, L: 0-150 Wrist flexion: R: 55(68), L: 79 Writ extension: R: 45(60), L: 0(12) RD: R: 10(24), L: 12(20) UD: R: 16(24), L: 8(16) Thumb radial abduction: R: 20(25), L: 8(20) Digit flexion to the [facility]: R:  2nd: 4 cm(4cm), 3rd: 6cm( 5cm), 4th: 6 cm (4cm), 5th: 5cm(5cm) L: 2nd: 7cm(cm), 3rd: 7cm(6cm), 4th: 7cm(6cm), 5th: 6cm(6cm)   10/27/2022   Shoulder flexion: R: 128(154), L: 160(160) Shoulder abduction: R: 105(130), L: 143(143) Elbow: R: 0-150, L: 0-150 Wrist flexion: R: 55(68), L: 79 Writ extension: R: 46(64), L: 0(20) RD: R: 10(20), L: 18(24) UD: R: 118(28), L: 8(20) Thumb radial abduction: R: 30(42), L:20(24) Digit flexion to the [facility]: R:  2nd: 4 cm(3cm), 3rd: 6cm( 6cm), 4th: 6 cm (5cm), 5th: 5cm(5cm) L: 2nd: 8cm(8cm), 3rd: 8cm(6cm), 4th: 8cm(7cm), 5th: 6cm(6cm)  01/19/2023  Shoulder flexion: R: 131(154), L: 160(160) Shoulder abduction:  R: 110(130), L: 143(143) Elbow: R: 0-150, L: 0-150 Wrist flexion: R: 55(68), L: 79 Writ extension: R: 46(64), L: 0(20) RD: R: 10(20), L: 18(24) UD: R: 118(28), L: 14(20) Thumb radial abduction: R: 38(48), L:24(26) Thumb IP flexion: R: 35(50) L: 20(45) Digit flexion to the [facility]: R:  2nd: 3.5 cm(3cm), 3rd: 6cm( 6cm), 4th: 6 cm (5cm), 5th: 5cm(4.5cm) L: 2nd: 8cm(7.5cm), 3rd: 8cm(6cm), 4th: 7.5cm(6cm), 5th: 6cm(4.5cm)  COORDINATION:   01/19/2023:  9 hole peg test:  Right: 2 pegs placed in 1 min. & 5 sec.  Pt. was able to remove 9 pegs in 17 sec.  Left: 2 pegs placed in 1 min. & 10 sec. Pt. was able to remove 9 pegs in 20 sec.  SENSATION: Intact  EDEMA: N/A   COGNITION: Overall  cognitive status: WNL   VISION: Subjective report: Wears glasses, No changes in vision.  PERCEPTION: Intact  TODAY'S TREATMENT:                                                                                                                              DATE: 01/19/2023    Manual Therapy:   Pt. tolerated soft tissue massage to the volar, and dorsal surface of each digit on the bilateral hands, digit lateral, and sagittal bands 2/2 to stiffness following moist heat modality. Soft tissue mobilization was performed for carpal, and metacarpal spread stretches. Manual therapy was performed independent of, and in preparation for therapeutic Ex.     Therapeutic Ex.    Pt. tolerated PROM followed by AROM bilateral wrist extension, PROM for bilateral digit MP, PIP, and DIP flexion, and extension, thumb radial, and palmar abduction. Pt. performed 3# dowel ex. For UE strengthening secondary to weakness. Bilateral shoulder flexion,  chest press,  and circular patterns. Pt. worked on gross grip strengthening with 1 resistive band strength.  PATIENT EDUCATION: Education details: Bilateral hand function, ROM, and strengthening Person educated: Patient Education method: Explanation, Demonstration, and Verbal cues Education comprehension: verbalized understanding and returned demonstration  HOME EXERCISE PROGRAM: Continue to assess HEP needs, and provide as needed.   GOALS: Goals reviewed with patient? Yes  LONG TERM GOALS: Target date: 04/13/2023  3.  Patient will demonstrate improved composite finger flexion to be able to firmly hold and use adaptive devices during ADLs, and IADLs.  Baseline: 01/19/2023: Pt. Is improving with Bilateral digit flexion to the [facility]. Pt. has difficulty firmly holding adaptive devices. 12/06/2022: Pt. continues to present with increased MP, PIP, and DIP digit tightness/stiffness limiting the formulation of bilateral composites fists. 10/27/2022: Pt. presents with increased  MP, PIP, and DIP digit tightness/stiffness limiting the formulation of bilateral composites fists during this progress reporting period limiting. Digit flexion to the [facility]: R:  2nd: 4 cm(3cm), 3rd: 6cm( 6cm), 4th: 6 cm (5cm), 5th: 5cm(5cm), L: 2nd: 8cm(8cm), 3rd: 8cm(6cm), 4th: 8cm(7cm), 5th: 6cm(6cm) 09/20/2022: Improving digit flexion to the [facility]. 09/13/22: improved digit flexion to the [facility]. 07/05/2022: Pt. presents with  digit MP, PIP, and DIP extensor tightness limiting her ability to securely grip objects in her bilateral hands.  04/07/2022: Pt. Has improved with right 2nd, and 3rd digit flexion towards the [facility]. Pt. Continues to have difficulty securely holding and applying deodorant.02/03/2022: Pt. Has improved with digit flexion, however, continues to have difficulty securely holding and using deodorant. 12/09/2021: Bilateral hand/digit MP, PIP, and DIP extension tightness limits her ability achieve digit flexion to hold, and apply deodorant. 10/28/2021:  Pt. continues to have difficulty holding the deodorant. 01/12/2021: Pt. Presents with limited digit extension. Pt. Is able to initiate holding deodorant, however is unable to hold it while using it  Goal status: Ongoing   4.  Pt. Will improve bilateral wrist extension in preparation for anticipating, and initiating reaching for objects at the table.  Baseline: 01/19/2023:  R: 46(64) L: 0(20) 12/06/2022: Pt continues to to progress with bilateral wrist extension in preparation fro functional reaching. 10/27/2022: Pt. Is improving with bilateral wrist extension. R: 46(64) L: 0(20) 09/20/2022: limited PROM in the Left wrist extension 2/2 tightness/stiffness. 09/13/22: R: 55(68), L: 0(12) 07/19/2022: Bilateral wrist extension in limited. 07/05/2022: Right: 40(60) Left: 0(12) 04/07/2022: Right: 40(55) Left: 5(18) 02/03/2022: Right: 34(55) Left: 3(4) 12/09/2021: Right  34(55)  Left  3(4) 10/28/2021:  Right 22(38), Left 0(15) 09/14/2021:  Right: 17(35), left 2(15)  Goal status:  Limited progress with wrist extension  this progress reporting period. Ongoing  7.  Pt. Will increase bilateral lateral pinch strength by 2 lbs to be able to securely grasp items during ADLs, and IADL tasks.  Baseline: 01/19/2023: Deferred as the Pinch meter is out for calibration.12/06/2022: Pt. Is able to more securely hold objects during ADLs/IADLs 10/27/2022: Pt. Is improving with holding items with her bilateral thumbs. (Pinch meter out for calibration) 09/20/2022: lateral pinch continues to be limited 09/13/22: R 6.5# L 4# 07/19/2022: TBD 07/05/2022: TBD 07/2022: NT-Pinch meter out for calibration. 02/03/2022: Right: 6#, Left: 4# 12/09/2021: Right: 6#, Left: 4#  Goal status: Deferred   9.  Pt. will complete plant care with modified independence.  Baseline:12/06/2022: Pt. Is independent with plant care in her husband's room, however is not able to access her plants due to furniture blocking access to them. 10/27/2022: Pt. Is able to water plants that are closer, and within reach. Pt. continues to have difficulty reaching for thorough plant care. 09/20/2022: Pt. Continues to have difficulty reaching for thorough plant care. 09/13/22: continues to report intermittent difficulty 07/19/2022: Pt. Continues to be able to water, and care for some of her plants. Pt. Has more difficulty with plants that are harder to reach. 07/05/2022: pt. Is able to water, and care for some of her plants. Pt. Has more difficulty with plants that are harder to reach. 04/07/2022: Pt. is now able to hold a cup and water her plants.02/03/2022: Pt. has difficulty caring for her plants.  Goal status:Achieved   10.  Pt. will demonstrate adaptive techniques to assist with the efficiency of self-dressing, or morning care tasks.  Baseline: 01/19/2203: Pt. Is able to donn her jacket in reverse independently. Pt. requires assist from staff, as Pt. Reports staff have decreased time. 12/06/2022: Pt. requires assist from staff, as Pt. Reports staff  have decreased time. 10/27/2022: Pt. is able to assist with initiating UE dressing. Pt. requires assist from personal, and staff care aides. 09/20/2022: Pt. continues to require assist form personal, and staff care aides. 09/13/22: MODA dressing, reports she is often rushed 07/19/2022: Pt. continues to require assist with  self-dressing/morning care tasks. 07/05/2022: Continue with goal. 04/07/2022: Pt. Continues to require assist with the efficiency of self-dressing, and morning care tasks.02/03/2022: Pt. requires assist from caregivers 2/2 time limitations during morning care.  Goal status: Ongoing  11.  Pt. will improve BUE strength to improve ADL, and IADL functioning.  Baseline: 01/19/2023: L 4/5, R 3+/5 12/06/2022: Continue 10/27/2022: shoulder flexion L 4/5, R 3+/5; shoulder abduction L 4+/5, R 3+/5. elbow flexion B 5/5, elbow extension 4/5 09/20/2022: Pt. Presents with limited BUE strength 09/13/22: shoulder flexion L 4/5, R 3/5; elbow flexion B 5/5, elbow extension 4-/5; shoulder abduction L 4+/5, R 3+/5. 07/19/2022: BUE strength continues to be limited. 07/05/2022: shoulder flexion: right 4-/5, abduction: 3+/5, elbow flexion: right: 5/5, left 5/5, extension: right: 4-/5, left 4-/5, wrist extension: right: 3-/5, left: 2-/5  Goal status: Ongoing  12.  Pt. will improve bilateral thumb radial abduction in order to be able to hold the grab bars while standing with PT  Baseline: 01/19/2023: R: 38(48) L: 24(26)12/06/2022: Pt. Continues to present  with limited thumb abduction, however is improving holding onto the parallel bars.10/27/2022: thumb radial abduction: R: 30(42), L: 20(24  Goal status: Ongoing  13.  Pt. Will be able to securely hold, and stabilize medication bottles at a tabletop surface when opening, and closing them.  Baseline: 01/19/2023: Pt. Is now able to securely hold medication bottles while opening them, and stabilizes them on surfaces while opening them. 12/06/2022: Pt is improving with securely  stabilizing medication bottles. 10/27/2022: Pt. Stabilizes bottles against her torso when attempting to open, and close them   Goal status: Achieved  14: Pt. Will improve bilateral thumb IP flexion to improve active grasping patterns.    Baseline: 01/19/2023: Right 35(50), Left: 20/45)    Goal status: New    15. Pt. Will improve bilateral FMC/speed, and dexterity. As evidence by improved scores on the 9 hole peg test.   Baseline: 01/19/2023: 2 pegs placed in 1 min. & 5 sec.  Pt. was able to remove 9 pegs in 17 sec.; Left: 2 pegs placed in 1 min. & 10    sec. Pt. was able to remove 9 pegs in 20 sec.   Goal status: New      ASSESSMENT:  CLINICAL IMPRESSION:  Pt. continues to report no pain. Pt. tolerated UE there. Ex, and manual therapy well, without reports of pain. Pt. continues to present with tightness at the bilateral thumb webspace, bilateral wrist flexor tightness, as well as digit MP, PIP, and DIP extensor tightness which continues to limit her ability to complete ADL tasks efficiently.Pt. tolerated increased dowel strength to 3#  today. Pt. Was able to complete right hand grip strengthening for 2 sets, and 1 set with the left hand. Pt. continues to benefit from working on impoving ROM, and UE strength in order to work towards increasing bilateral hand grasp on objects, and increasing engagement of bilateral hands during ADLs, and IADL tasks. Plan to complete the progress report, and the recertification next visit.     PERFORMANCE DEFICITS: in functional skills including ADLs, IADLs, coordination, dexterity, ROM, strength, and UE functional use, cognitive skills including , and psychosocial skills including coping strategies and environmental adaptation.   IMPAIRMENTS: are limiting patient from ADLs, IADLs, and leisure.   CO-MORBIDITIES: may have co-morbidities  that affects occupational performance. Patient will benefit from skilled OT to address above impairments and improve overall  function.  MODIFICATION OR ASSISTANCE TO COMPLETE EVALUATION: Min-Moderate modification of tasks or assist with  assess necessary to complete an evaluation.  OT OCCUPATIONAL PROFILE AND HISTORY: Detailed assessment: Review of records and additional review of physical, cognitive, psychosocial history related to current functional performance.  CLINICAL DECISION MAKING: Moderate - several treatment options, min-mod task modification necessary  REHAB POTENTIAL: Good for stated goals  PLAN:  OT FREQUENCY 2x's a week  OT DURATION: 12 weeks  PLANNED INTERVENTIONS ADL training, A/E training, UE ther. Ex, Manual therapy, neuromuscular re-education, moist heat modality, Paraffin Bath, Splinting, and  Pt./caregiver education   RECOMMENDED OTHER SERVICES: PT  CONSULTED AND AGREED WITH PLAN OF CARE: Patient  PLAN FOR NEXT SESSION: Continue Treatment as per established POC  Olegario Messier, MS, OTR/L  01/19/2023

## 2023-02-07 ENCOUNTER — Ambulatory Visit: Payer: Medicare HMO | Admitting: Occupational Therapy

## 2023-02-07 ENCOUNTER — Ambulatory Visit: Payer: Medicare HMO

## 2023-02-09 ENCOUNTER — Ambulatory Visit: Payer: Medicare HMO

## 2023-02-09 ENCOUNTER — Ambulatory Visit: Payer: Medicare HMO | Admitting: Occupational Therapy

## 2023-02-14 ENCOUNTER — Ambulatory Visit: Payer: Medicare HMO | Attending: Internal Medicine | Admitting: Occupational Therapy

## 2023-02-14 ENCOUNTER — Ambulatory Visit: Payer: Medicare HMO

## 2023-02-14 DIAGNOSIS — M6281 Muscle weakness (generalized): Secondary | ICD-10-CM

## 2023-02-14 DIAGNOSIS — R278 Other lack of coordination: Secondary | ICD-10-CM

## 2023-02-14 DIAGNOSIS — R262 Difficulty in walking, not elsewhere classified: Secondary | ICD-10-CM

## 2023-02-14 DIAGNOSIS — S72001A Fracture of unspecified part of neck of right femur, initial encounter for closed fracture: Secondary | ICD-10-CM | POA: Diagnosis present

## 2023-02-14 DIAGNOSIS — R269 Unspecified abnormalities of gait and mobility: Secondary | ICD-10-CM

## 2023-02-14 DIAGNOSIS — R296 Repeated falls: Secondary | ICD-10-CM | POA: Diagnosis present

## 2023-02-14 DIAGNOSIS — R2681 Unsteadiness on feet: Secondary | ICD-10-CM | POA: Insufficient documentation

## 2023-02-14 DIAGNOSIS — S14129S Central cord syndrome at unspecified level of cervical spinal cord, sequela: Secondary | ICD-10-CM

## 2023-02-14 DIAGNOSIS — S72002A Fracture of unspecified part of neck of left femur, initial encounter for closed fracture: Secondary | ICD-10-CM | POA: Insufficient documentation

## 2023-02-14 DIAGNOSIS — R2689 Other abnormalities of gait and mobility: Secondary | ICD-10-CM | POA: Diagnosis present

## 2023-02-14 NOTE — Therapy (Signed)
 OUTPATIENT PHYSICAL THERAPY NEURO TREATMENT  Patient Name: Sherry [name] MRN: [id number] DOB:[birth year], 89 y.o., female Today's Date: 02/14/2023   PCP: Richerd Brigham, MD REFERRING PROVIDER: Cleotilde Barrio   PT End of Session - 02/14/23 1415     Visit Number 94    Number of Visits 112    Date for PT Re-Evaluation 03/23/23    Authorization Type [employer] FOTO performed by PT on eval (7/20), score 12, Progress note on 03/10/2020; PN on 08/20/2020    Authorization Time Period Initial cert 0/81/7976-87/88/7976; Recert 01/11/2022-04/05/2022; Recert 04/07/2022-06/30/2022    Progress Note Due on Visit 100    PT Start Time 1147    PT Stop Time 1223    PT Time Calculation (min) 36 min    Equipment Utilized During Treatment Gait belt    Activity Tolerance Patient tolerated treatment well;No increased pain;Patient limited by fatigue    Behavior During Therapy [facility] for tasks assessed/performed              Past Medical History:  Diagnosis Date   Acute blood loss anemia    Arthritis    Cancer (HCC)    skin   Central cord syndrome at C6 level of cervical spinal cord (HCC) 11/29/2017   Hypertension    Protein-calorie malnutrition, severe (HCC) 01/24/2018   S/P insertion of IVC (inferior vena caval) filter 01/24/2018   Tetraparesis (HCC)    Past Surgical History:  Procedure Laterality Date   ANTERIOR CERVICAL DECOMP/DISCECTOMY FUSION N/A 11/29/2017   Procedure: Cervical five-six, six-seven Anterior Cervical Decompression Fusion;  Surgeon: Cheryle Debby DELENA, MD;  Location: [facility] OR;  Service: Neurosurgery;  Laterality: N/A;  Cervical five-six, six-seven Anterior Cervical Decompression Fusion   CATARACT EXTRACTION     FEMUR IM NAIL Left 08/16/2021   Procedure: INTRAMEDULLARY (IM) NAIL FEMORAL;  Surgeon: Cleotilde Barrio, MD;  Location: [facility] ORS;  Service: Orthopedics;  Laterality: Left;   IR IVC FILTER PLMT / S&I /IMG GUID/MOD SED  12/08/2017   Patient Active Problem List   Diagnosis  Date Noted   Age-related osteoporosis without current pathological fracture 07/27/2022   Mild recurrent major depression (HCC) 07/27/2022   Hypertensive heart disease with other congestive heart failure (HCC) 02/14/2022   Chronic indwelling Foley catheter 02/14/2022   Closed hip fracture (HCC) 08/15/2021   DVT (deep venous thrombosis) (HCC) 08/15/2021   Quadriplegia (HCC) 08/15/2021   Fall 08/15/2021   Constipation due to slow transit 08/31/2018   Trauma 06/05/2018   Neuropathic pain 06/05/2018   Neurogenic bowel 06/05/2018   Vaginal yeast infection 01/30/2018   Healthcare-associated pneumonia 01/25/2018   Chronic allergic rhinitis 01/24/2018   Depression with anxiety 01/24/2018   UTI due to Klebsiella species 01/24/2018   Protein-calorie malnutrition, severe (HCC) 01/24/2018   S/P insertion of IVC (inferior vena caval) filter 01/24/2018   Tetraparesis (HCC) 01/20/2018   Neurogenic bladder 01/20/2018   Reactive depression    Benign essential HTN    Acute postoperative anemia due to expected blood loss    Central cord syndrome at C6 level of cervical spinal cord (HCC) 11/29/2017   Allergy to alpha-gal 11/25/2016   SCC (squamous cell carcinoma) 04/08/2014    ONSET DATE: 08/15/2021 (fall with B hip fx); Initial injury was in 2019- quadraplegia due to central cord syndrome of C6 secondary to neck fracture.   REFERRING DIAG: Bilateral Hip fx; Left Open- s/p Left IM nail femoral on 08/16/2021; Right closed  THERAPY DIAG:  Muscle weakness (generalized)  Other lack of coordination  Central cord syndrome, sequela (HCC)  Other abnormalities of gait and mobility  Difficulty in walking, not elsewhere classified  Closed fracture of both hips, initial encounter (HCC)  Abnormality of gait and mobility  Repeated falls  Unsteadiness on feet  Rationale for Evaluation and Treatment Rehabilitation  SUBJECTIVE:   SUBJECTIVE STATEMENT:   Patient reports doing well with no new  issues. Reports had caregiver issues  and had to miss last Wednesday.     Pt accompanied by:  Paid caregiver-   PERTINENT HISTORY: Sherry [name] is an 87yoF who experienced a fall at home on 08/15/2021 with hip fracture, s/p Left hip ORIF. PMH: quadriplegia due to central cord syndrome of C6 secondary to neck fracture, HTN, depression with anxiety, neurogenic bladder, bilateral DVT on Xarelto  (s/p of IVC filter placement). Prior history of significant PT/OT with improvement of function. Pt has full use of shoulder/elbows, limited use of hands, adaptive self feeding sucessful.     PAIN:  None current but does endorse some left thigh pain intermittent  OBJECTIVE:    TODAY'S TREATMENT:  02/14/23     THEREX: all performed power w/c today   -Active assisted seated marching 2 x 15 each LE -Seated LAQ 2 x 15 each LE   -PROM left knee flex stretch/quad stretch x 30 sec hold x3   THERAPEUTIC ACTIVITIES:    -Sit to stand in // bars - max assist to stand and then to anterior  Weight shift x 4  -Static standing in // bars with Min - mod assist at trunk right  Blocked - x multiple attempts- at best 3 min 44 sec today.   PATIENT EDUCATION: Education details: cues for isolated joint exercises  HOME EXERCISE PROGRAM: No changes at this time  Access Code: [facility] URL: [email or web address] Date: 11/23/2021 Prepared by: Marina  Moser Exercises - Seated Gluteal Sets - 2 x daily - 7 x weekly - 2 sets - 10 reps - 5 hold - Seated Quad Set - 2 x daily - 7 x weekly - 2 sets - 10 reps - 5 hold - Seated Long Arc Quad - 2 x daily - 7 x weekly - 2 sets - 10 reps - 5 hold - Seated March - 2 x daily - 7 x weekly - 2 sets - 10 reps - 5 hold - Seated Hip Abduction - 2 x daily - 7 x weekly - 2 sets - 10 reps - 5 hold - Seated Shoulder Shrugs - 2 x daily - 7 x weekly - 2 sets - 10 reps - 5 hold - Wheelchair Pressure Relief - 2 x daily - 7 x weekly - 2 sets - 10 reps - 5 hold  GOALS: Goals  reviewed with patient? Yes  SHORT TERM GOALS: Target date: 02/22/2022  Pt will be independent with initial UE strengthening HEP in order to improve strength and balance in order to decrease fall risk and improve function at home and work. Baseline: 10/19/2021- patient with no formal UE HEP; 12/02/2021= Patient verbalized knowledge of HEP including use of theraband for UE strengthening.  Goal status: GOAL MET  LONG TERM GOALS: Target date: 03/23/2023  Pt will be independent with final for UE/LE HEP in order to improve strength and balance in order to decrease fall risk and improve function at home and work. Baseline: Patient is currently BLE NWB and unable to participate in HEP. Has order for UE strengthening. 01/11/2022- Patient now able to participate in LE strengthening although NWB still- good understanding  for some basic exercises. Will keep goal active to incorporate progressive LE strengthening exercises. 04/07/2022= Patient still participating in progressive LE seated HEP and has no questions at this time.  Goal status: MET  2.  Pt will improve FOTO to target score of 35  to display perceived improvements in ability to complete ADL's.  Baseline: 10/19/2021= 12; 12/02/2021= 17; 01/11/2022=17; 04/07/2022= 17; 07/05/2022=17; 10/06/2022- outcome measure not appropriate as patient in non-ambulatory and not consistently standing. Goal status: Goal not appropriate  3.  Pt will increase strength of B UE  by at least 1/2 MMT grade in order to demonstrate improvement in strength and function  Baseline: patient has range of 2-/5 to 4/5 BUE Strength; 12/02/2021= 4/5 except with wrist ext. 01/11/2022= 4/5 B UE strength expept for wrist Goal status: Goal revised-no longer appropriate- working with OT on all UE strengthening.   4. Pt. Will increase strength of RLE by at least 1/2 MMT grade in order to demonstrate improvement in Standing/transfers.  Baseline: 2-/5 Right hip flex/knee ext/flex; 04/07/2022= 2- with right  hip flex/knee ext (lacking 28 deg from zero) 4/8= Patient able to ext right knee lacking 18 deg from zero. 07/05/2022=Left knee approx 8 deg from zero and right knee = 23 deg from zero; 12/29/2022 - Left LE = full ROM 4-/5 and right knee ext= 23 deg from zero= 2+/5 (gravity minimized)   Goal status: Progressing  5. Pt. Will demo ability to stand pivot transfer with max assist for improved functional mobility and less dependent need on mechanical device.   Baseline: Dependent on hoyer lift for all transfers; 04/07/2022- Unable to test secondary to patient with recent UTI and right hand procedure - will attempt to reassess next visit. ; 04/12/22: Unable to successfully stand due to feet plates on loaned power w/c; 05/10/2022- Patient still having to use rental chair and has not received her original power w/c back to practice SPT. 07/04/2021= Patient just received new power w/c and attempted standing from 1st time today- Patient able to stand with max assist today from w/c and did not attempt to perform pivot transfer- difficulty with static standing and placing weight on right LE. 10/06/2022- Not assessed today- patient needs new AFO prior to attempting transfers; 12/29/2022= Patient has not attempted SPT yet - still trying to improve LE strength enough to stand well without knee buckling with weight shifting.   Goal status: ONGOING  6. Pt. Will demonstrate improved functional LE strength as seen by ability to stand > 2 min for improved transfer ability and pregait abilities.   Baseline: not assessed today due to recent dx: UTI; 04/07/2022- Unable to test secondary to patient with recent UTI and right hand procedure - will attempt to reassess next visit; 04/12/22: Unable to successfully stand due to feet plates on loaned power w/c 05/10/2022- Patient still having to use rental chair and has not received her original power w/c back to practice Standing. 07/05/2022- Patient able to stand with max assist at trunk today for 2 min  3 sec. Will keep goal active to ensure consistency. 10/06/2022= Unable to assess today- patient with some recent blood pressure issues and needs new AFO. 11/24/2022= Patient demonstrated multiple rounds of standing in // bars- holding up to 1:45 min today but has been able to exhibit 2 min in recent past- working on consistency. 12/29/2022= 2 min 40 sec with patient demonstrating improved overall posture and ability to contract glutes and quads with just min assist at trunk today.  Goal status: MET  7. Pt. Will demonstrate improved functional LE strength as seen by ability to stand > 5 min for improved transfer ability and pregait abilities. Baseline: 12/29/2022= 2 min 40 sec with patient demonstrating improved overall posture and ability to contract glutes and quads with just min assist at trunk today.  Goal Status: NEW  8. Patient will perform sit to stand transfer with moderate assist consistently > 75% of time for improved transfer ability and less dependence on caregiver.   Baseline: 12/29/2022- Patient currently max assist level assistance with all sit to stand activities.   Goal status: New   ASSESSMENT:  CLINICAL IMPRESSION:   Treatment focused on LE strengthening and progressing toward standing goal. She demo good motivation to maximize her standing effort and able to stand >2 min consistently today with improving gluteal activation and no knee buckling except for last stand to sit. Pt will continue to benefit from skilled PT services to optimize independence and reduced caregiver support.    OBJECTIVE IMPAIRMENTS Abnormal gait, decreased activity tolerance, decreased balance, decreased coordination, decreased endurance, decreased mobility, difficulty walking, decreased ROM, decreased strength, hypomobility, impaired flexibility, impaired UE functional use, postural dysfunction, and pain.   ACTIVITY LIMITATIONS carrying, lifting, bending, sitting, standing, squatting, sleeping, stairs,  transfers, bed mobility, continence, bathing, toileting, dressing, self feeding, reach over head, hygiene/grooming, and caring for others  PARTICIPATION LIMITATIONS: meal prep, cleaning, laundry, medication management, personal finances, interpersonal relationship, driving, shopping, community activity, and yard work  PERSONAL FACTORS Age, Time since onset of injury/illness/exacerbation, and 1-2 comorbidities: HTN, cervical Sx  are also affecting patient's functional outcome.   REHAB POTENTIAL: Good  CLINICAL DECISION MAKING: Evolving/moderate complexity  EVALUATION COMPLEXITY: Moderate  PLAN: PT FREQUENCY: 1-2x/week  PT DURATION: 12 weeks  PLANNED INTERVENTIONS: Therapeutic exercises, Therapeutic activity, Neuromuscular re-education, Balance training, Gait training, Patient/Family education, Self Care, Joint mobilization, DME instructions, Dry Needling, Electrical stimulation, Wheelchair mobility training, Spinal mobilization, Cryotherapy, Moist heat, and Manual therapy  PLAN FOR NEXT SESSION:  LE and core strength training, Sit to stand and static standing; progress to SPT as able.     2:26 PM, 02/14/23 Chyrl London, PT Physical Therapist - [facility]  Outpatient Physical Therapy- Main Campus [phone]

## 2023-02-14 NOTE — Therapy (Signed)
 Occupational Therapy Treatment Note  Dates of reporting period  12/06/2022   to   01/19/2023    Patient Name: Sherry [name] MRN: [id number] DOB:[birth year], 89 y.o., female Today's Date: 11/29/2022  PCP:  [facility] PROVIDER: Abdul Fine, MD  END OF SESSION:   OT End of Session - 02/14/23 1502     Visit Number 102    Number of Visits 168    Date for OT Re-Evaluation 04/13/23    OT Start Time 1100    OT Stop Time 1145    OT Time Calculation (min) 45 min    Equipment Utilized During Treatment powered wheelchair    Activity Tolerance Patient tolerated treatment well    Behavior During Therapy WFL for tasks assessed/performed                 Past Medical History:  Diagnosis Date   Acute blood loss anemia    Arthritis    Cancer (HCC)    skin   Central cord syndrome at C6 level of cervical spinal cord (HCC) 11/29/2017   Hypertension    Protein-calorie malnutrition, severe (HCC) 01/24/2018   S/P insertion of IVC (inferior vena caval) filter 01/24/2018   Tetraparesis (HCC)    Past Surgical History:  Procedure Laterality Date   ANTERIOR CERVICAL DECOMP/DISCECTOMY FUSION N/A 11/29/2017   Procedure: Cervical five-six, six-seven Anterior Cervical Decompression Fusion;  Surgeon: Cheryle Debby DELENA, MD;  Location: [facility] OR;  Service: Neurosurgery;  Laterality: N/A;  Cervical five-six, six-seven Anterior Cervical Decompression Fusion   CATARACT EXTRACTION     FEMUR IM NAIL Left 08/16/2021   Procedure: INTRAMEDULLARY (IM) NAIL FEMORAL;  Surgeon: Cleotilde Barrio, MD;  Location: [facility] ORS;  Service: Orthopedics;  Laterality: Left;   IR IVC FILTER PLMT / S&I /IMG GUID/MOD SED  12/08/2017   Patient Active Problem List   Diagnosis Date Noted   Age-related osteoporosis without current pathological fracture 07/27/2022   Mild recurrent major depression (HCC) 07/27/2022   Hypertensive heart disease with other congestive heart failure (HCC) 02/14/2022   Chronic indwelling Foley  catheter 02/14/2022   Closed hip fracture (HCC) 08/15/2021   DVT (deep venous thrombosis) (HCC) 08/15/2021   Quadriplegia (HCC) 08/15/2021   Fall 08/15/2021   Constipation due to slow transit 08/31/2018   Trauma 06/05/2018   Neuropathic pain 06/05/2018   Neurogenic bowel 06/05/2018   Vaginal yeast infection 01/30/2018   Healthcare-associated pneumonia 01/25/2018   Chronic allergic rhinitis 01/24/2018   Depression with anxiety 01/24/2018   UTI due to Klebsiella species 01/24/2018   Protein-calorie malnutrition, severe (HCC) 01/24/2018   S/P insertion of IVC (inferior vena caval) filter 01/24/2018   Tetraparesis (HCC) 01/20/2018   Neurogenic bladder 01/20/2018   Reactive depression    Benign essential HTN    Acute postoperative anemia due to expected blood loss    Central cord syndrome at C6 level of cervical spinal cord (HCC) 11/29/2017   Allergy to alpha-gal 11/25/2016   SCC (squamous cell carcinoma) 04/08/2014   ONSET DATE: 11/29/2017  REFERRING DIAG: Central Cord Syndrome at C6 of the Cervical Spinal Cord, Fall with Bilateral Closed Hip Fractures, with ORIF repair with Intramedullary Nailing of the Left Hip Fracture.   THERAPY DIAG:  Muscle weakness (generalized)  Other lack of coordination  Rationale for Evaluation and Treatment: Rehabilitation  SUBJECTIVE:   SUBJECTIVE STATEMENT:  Pt reports  doing well today.  Pt accompanied by: self, Personal Care aide  PERTINENT HISTORY: Central Cord Syndrome at C6 of the Cervical Spinal Cord,  Fall with Bilateral Closed Hip Fractures, with ORIF repair with Intramedullary Nailing of the Left Hip Fracture.   PRECAUTIONS: None  WEIGHT BEARING RESTRICTIONS: No  PAIN:  Are you having pain? No pain, just shoulder stiffness   LIVING ENVIRONMENT: Lives with: [facility]  Stairs: No Has following equipment at home: Wheelchair (power)  PLOF: Independent   PATIENT GOALS:  See below for established  goals  OBJECTIVE:  Note: Objective measures were completed at Evaluation unless otherwise noted.  HAND DOMINANCE: Right  ADLs: Caregiver assist with ADLs, and [facility]  MOBILITY STATUS: Uses a power w/c  ACTIVITY TOLERANCE: Activity tolerance: Fair  FUNCTIONAL OUTCOME MEASURES:  Measurements:   12/09/2021:   Shoulder flexion: R: 120(136), L: 138(150) Shoulder abduction: R: 108(120), L: 110(120) Elbow: R: 0-148, L: 0-146 Wrist flexion: R: 55(62), L: 78 Writ extension: R: 34(55), L: 3(4) RD: R: 12(20), L: 16(26) UD: R: 8(24), L: 6(18) Thumb radial abduction: R: 11(20), L: 8(20) Digit flexion to the [facility]: R:  2nd: 5cm(3.5cm), 3rd: 7.5 cm(5cm), 4th: 7cm(3cm), 5th: 6cm(5.5cm) L: 2nd: 8cm(8cm), 3rd: 8cm(8cm), 4th: 7.5cm(7cm), 5th: 7cm(7cm)   02/03/2022:   Shoulder flexion: R: 122(138), L: 148(154) Shoulder abduction: R: 109(125), L: 125(125 Elbow: R: 0-148, L: 0-146 Wrist flexion: R: 60(68), L: 79 Writ extension: R: 40(55), L: 3(10) RD: R: 14(24), L: 20(28) UD: R: 8(24), L: 6(18) Thumb radial abduction: R: 20(25), L: 8(20) Digit flexion to the [facility]: R:  2nd: 4.5cm(3cm), 3rd: 6.5cm(4.5cm), 4th: 6.5 cm(3cm), 5th: 4.5cm(5cm) L: 2nd: 8cm(8cm), 3rd: 8cm(7cm), 4th: 7.5cm(7cm), 5th: 6.5cm(6.5cm)   04/07/2022:   Shoulder flexion: R: 125(150), L: 160(160) Shoulder abduction: R: 109(125), L: 125(125 Elbow: R: 0-148, L: 0-146 Wrist flexion: R: 60(68), L: 79 Writ extension: R: 40(55), L: 5(18) RD: R: 14(24), L: 20(28) UD: R: 18(24), L: 8(18) Thumb radial abduction: R: 20(25), L: 8(20) Digit flexion to the [facility]: R:  2nd: 4.5 cm(4 cm), 3rd: 6.5cm(3cm), 4th: 7 cm (5cm), 5th: 5cm(5cm) L: 2nd: 8cm(8cm), 3rd: 8cm(7cm), 4th: 7.5cm(7cm), 5th: 7cm(7cm)   07/05/2022:   Shoulder flexion: R: 125(154), L: 160(160) Shoulder abduction: R: 95(130), L: 143(143) Elbow: R: 0-148, L: 0-146 Wrist flexion: R: 60(68), L: 79 Writ extension: R: 40(60), L: 0(12) RD: R: 10(24), L: 12(20) UD:  R: 16(24), L: 8(16) Thumb radial abduction: R: 20(25), L: 8(20) Digit flexion to the [facility]: R:  2nd: 4 cm(4cm), 3rd: 6.5cm( 3.5cm), 4th: 7 cm (5cm), 5th: 6cm(5cm) L: 2nd: 8cm(8cm), 3rd: 8cm(7cm), 4th: 8cm(7cm), 5th: 7cm(7cm)   09/13/2022:   Shoulder flexion: R: 125(154), L: 160(160) Shoulder abduction: R: 100(130), L: 143(143) Elbow: R: 0-150, L: 0-150 Wrist flexion: R: 55(68), L: 79 Writ extension: R: 45(60), L: 0(12) RD: R: 10(24), L: 12(20) UD: R: 16(24), L: 8(16) Thumb radial abduction: R: 20(25), L: 8(20) Digit flexion to the [facility]: R:  2nd: 4 cm(4cm), 3rd: 6cm( 5cm), 4th: 6 cm (4cm), 5th: 5cm(5cm) L: 2nd: 7cm(cm), 3rd: 7cm(6cm), 4th: 7cm(6cm), 5th: 6cm(6cm)   10/27/2022   Shoulder flexion: R: 128(154), L: 160(160) Shoulder abduction: R: 105(130), L: 143(143) Elbow: R: 0-150, L: 0-150 Wrist flexion: R: 55(68), L: 79 Writ extension: R: 46(64), L: 0(20) RD: R: 10(20), L: 18(24) UD: R: 118(28), L: 8(20) Thumb radial abduction: R: 30(42), L:20(24) Digit flexion to the [facility]: R:  2nd: 4 cm(3cm), 3rd: 6cm( 6cm), 4th: 6 cm (5cm), 5th: 5cm(5cm) L: 2nd: 8cm(8cm), 3rd: 8cm(6cm), 4th: 8cm(7cm), 5th: 6cm(6cm)  01/19/2023  Shoulder flexion: R: 131(154), L: 160(160) Shoulder abduction: R: 110(130),  L: 143(143) Elbow: R: 0-150, L: 0-150 Wrist flexion: R: 55(68), L: 79 Writ extension: R: 46(64), L: 0(20) RD: R: 10(20), L: 18(24) UD: R: 118(28), L: 14(20) Thumb radial abduction: R: 38(48), L:24(26) Thumb IP flexion: R: 35(50) L: 20(45) Digit flexion to the [facility]: R:  2nd: 3.5 cm(3cm), 3rd: 6cm( 6cm), 4th: 6 cm (5cm), 5th: 5cm(4.5cm) L: 2nd: 8cm(7.5cm), 3rd: 8cm(6cm), 4th: 7.5cm(6cm), 5th: 6cm(4.5cm)  COORDINATION:   01/19/2023:  9 hole peg test:  Right: 2 pegs placed in 1 min. & 5 sec.  Pt. was able to remove 9 pegs in 17 sec.  Left: 2 pegs placed in 1 min. & 10 sec. Pt. was able to remove 9 pegs in 20 sec.  SENSATION: Intact  EDEMA: N/A   COGNITION: Overall cognitive  status: WNL   VISION: Subjective report: Wears glasses, No changes in vision.  PERCEPTION: Intact  TODAY'S TREATMENT:                                                                                                                              DATE: 01/19/2023    Manual Therapy:   Pt. tolerated soft tissue massage to the volar, and dorsal surface of each digit on the bilateral hands, digit lateral, and sagittal bands 2/2 to stiffness following moist heat modality. Soft tissue mobilization was performed for carpal, and metacarpal spread stretches. Manual therapy was performed independent of, and in preparation for therapeutic Ex.     Therapeutic Ex.    Pt. tolerated PROM followed by AROM bilateral wrist extension, PROM for bilateral digit MP, PIP, and DIP flexion, and extension, thumb abduction. Pt. Worked on BUE strengthening, and reciprocal motion using the UBE while seated for 8 min. with no resistance.    PATIENT EDUCATION: Education details: Bilateral hand function, ROM, and strengthening Person educated: Patient Education method: Explanation, Demonstration, and Verbal cues Education comprehension: verbalized understanding and returned demonstration  HOME EXERCISE PROGRAM: Continue to assess HEP needs, and provide as needed.   GOALS: Goals reviewed with patient? Yes  LONG TERM GOALS: Target date: 04/13/2023  3.  Patient will demonstrate improved composite finger flexion to be able to firmly hold and use adaptive devices during ADLs, and IADLs.  Baseline: 01/19/2023: Pt. Is improving with Bilateral digit flexion to the [facility]. Pt. has difficulty firmly holding adaptive devices. 12/06/2022: Pt. continues to present with increased MP, PIP, and DIP digit tightness/stiffness limiting the formulation of bilateral composites fists. 10/27/2022: Pt. presents with increased MP, PIP, and DIP digit tightness/stiffness limiting the formulation of bilateral composites fists during this progress  reporting period limiting. Digit flexion to the [facility]: R:  2nd: 4 cm(3cm), 3rd: 6cm( 6cm), 4th: 6 cm (5cm), 5th: 5cm(5cm), L: 2nd: 8cm(8cm), 3rd: 8cm(6cm), 4th: 8cm(7cm), 5th: 6cm(6cm) 09/20/2022: Improving digit flexion to the [facility]. 09/13/22: improved digit flexion to the [facility] 07/05/2022: Pt. presents with digit MP, PIP, and DIP extensor tightness limiting her ability to securely grip objects in her  bilateral hands.  04/07/2022: Pt. Has improved with right 2nd, and 3rd digit flexion towards the [facility]. Pt. Continues to have difficulty securely holding and applying deodorant.02/03/2022: Pt. Has improved with digit flexion, however, continues to have difficulty securely holding and using deodorant. 12/09/2021: Bilateral hand/digit MP, PIP, and DIP extension tightness limits her ability achieve digit flexion to hold, and apply deodorant. 10/28/2021:  Pt. continues to have difficulty holding the deodorant. 01/12/2021: Pt. Presents with limited digit extension. Pt. Is able to initiate holding deodorant, however is unable to hold it while using it  Goal status: Ongoing   4.  Pt. Will improve bilateral wrist extension in preparation for anticipating, and initiating reaching for objects at the table.  Baseline: 01/19/2023:  R: 46(64) L: 0(20) 12/06/2022: Pt continues to to progress with bilateral wrist extension in preparation fro functional reaching. 10/27/2022: Pt. Is improving with bilateral wrist extension. R: 46(64) L: 0(20) 09/20/2022: limited PROM in the Left wrist extension 2/2 tightness/stiffness. 09/13/22: R: 55(68), L: 0(12) 07/19/2022: Bilateral wrist extension in limited. 07/05/2022: Right: 40(60) Left: 0(12) 04/07/2022: Right: 40(55) Left: 5(18) 02/03/2022: Right: 34(55) Left: 3(4) 12/09/2021: Right  34(55)  Left  3(4) 10/28/2021:  Right 22(38), Left 0(15) 09/14/2021:  Right: 17(35), left 2(15)  Goal status: Limited progress with wrist extension  this progress reporting period. Ongoing  7.  Pt. Will increase bilateral  lateral pinch strength by 2 lbs to be able to securely grasp items during ADLs, and IADL tasks.  Baseline: 01/19/2023: Deferred as the Pinch meter is out for calibration.12/06/2022: Pt. Is able to more securely hold objects during ADLs/IADLs 10/27/2022: Pt. Is improving with holding items with her bilateral thumbs. (Pinch meter out for calibration) 09/20/2022: lateral pinch continues to be limited 09/13/22: R 6.5# L 4# 07/19/2022: TBD 07/05/2022: TBD 07/2022: NT-Pinch meter out for calibration. 02/03/2022: Right: 6#, Left: 4# 12/09/2021: Right: 6#, Left: 4#  Goal status: Deferred   9.  Pt. will complete plant care with modified independence.  Baseline:12/06/2022: Pt. Is independent with plant care in her husband's room, however is not able to access her plants due to furniture blocking access to them. 10/27/2022: Pt. Is able to water plants that are closer, and within reach. Pt. continues to have difficulty reaching for thorough plant care. 09/20/2022: Pt. Continues to have difficulty reaching for thorough plant care. 09/13/22: continues to report intermittent difficulty 07/19/2022: Pt. Continues to be able to water, and care for some of her plants. Pt. Has more difficulty with plants that are harder to reach. 07/05/2022: pt. Is able to water, and care for some of her plants. Pt. Has more difficulty with plants that are harder to reach. 04/07/2022: Pt. is now able to hold a cup and water her plants.02/03/2022: Pt. has difficulty caring for her plants.  Goal status:Achieved   10.  Pt. will demonstrate adaptive techniques to assist with the efficiency of self-dressing, or morning care tasks.  Baseline: 01/19/2203: Pt. Is able to donn her jacket in reverse independently. Pt. requires assist from staff, as Pt. Reports staff have decreased time. 12/06/2022: Pt. requires assist from staff, as Pt. Reports staff have decreased time. 10/27/2022: Pt. is able to assist with initiating UE dressing. Pt. requires assist from personal,  and staff care aides. 09/20/2022: Pt. continues to require assist form personal, and staff care aides. 09/13/22: MODA dressing, reports she is often rushed 07/19/2022: Pt. continues to require assist with self-dressing/morning care tasks. 07/05/2022: Continue with goal. 04/07/2022: Pt. Continues to require assist with the efficiency  of self-dressing, and morning care tasks.02/03/2022: Pt. requires assist from caregivers 2/2 time limitations during morning care.  Goal status: Ongoing  11.  Pt. will improve BUE strength to improve ADL, and IADL functioning.  Baseline: 01/19/2023: L 4/5, R 3+/5 12/06/2022: Continue 10/27/2022: shoulder flexion L 4/5, R 3+/5; shoulder abduction L 4+/5, R 3+/5. elbow flexion B 5/5, elbow extension 4/5 09/20/2022: Pt. Presents with limited BUE strength 09/13/22: shoulder flexion L 4/5, R 3/5; elbow flexion B 5/5, elbow extension 4-/5; shoulder abduction L 4+/5, R 3+/5. 07/19/2022: BUE strength continues to be limited. 07/05/2022: shoulder flexion: right 4-/5, abduction: 3+/5, elbow flexion: right: 5/5, left 5/5, extension: right: 4-/5, left 4-/5, wrist extension: right: 3-/5, left: 2-/5  Goal status: Ongoing  12.  Pt. will improve bilateral thumb radial abduction in order to be able to hold the grab bars while standing with PT  Baseline: 01/19/2023: R: 38(48) L: 24(26)12/06/2022: Pt. Continues to present  with limited thumb abduction, however is improving holding onto the parallel bars.10/27/2022: thumb radial abduction: R: 30(42), L: 20(24  Goal status: Ongoing  13.  Pt. Will be able to securely hold, and stabilize medication bottles at a tabletop surface when opening, and closing them.  Baseline: 01/19/2023: Pt. Is now able to securely hold medication bottles while opening them, and stabilizes them on surfaces while opening them. 12/06/2022: Pt is improving with securely stabilizing medication bottles. 10/27/2022: Pt. Stabilizes bottles against her torso when attempting to open, and  close them   Goal status: Achieved  14: Pt. Will improve bilateral thumb IP flexion to improve active grasping patterns.    Baseline: 01/19/2023: Right 35(50), Left: 20/45)    Goal status: New    15. Pt. Will improve bilateral FMC/speed, and dexterity. As evidence by improved scores on the 9 hole peg test.   Baseline: 01/19/2023: 2 pegs placed in 1 min. & 5 sec.  Pt. was able to remove 9 pegs in 17 sec.; Left: 2 pegs placed in 1 min. & 10    sec. Pt. was able to remove 9 pegs in 20 sec.   Goal status: New      ASSESSMENT:  CLINICAL IMPRESSION:  Pt. continues to report no pain. Pt. tolerated UE there. Ex, and manual therapy well, without reports of pain. Pt. continues to present with tightness at the bilateral thumb webspace, bilateral wrist flexor tightness, as well as digit MP, PIP, and DIP extensor tightness which continues to limit her ability to complete ADL tasks efficiently.Pt. tolerated  the UBE for 8 min. With no rest breaks required. Pt. continues to benefit from working on impoving ROM, and UE strength in order to work towards increasing bilateral hand grasp on objects, and increasing engagement of bilateral hands during ADLs, and IADL tasks. Plan to complete the progress report, and the recertification next visit.     PERFORMANCE DEFICITS: in functional skills including ADLs, IADLs, coordination, dexterity, ROM, strength, and UE functional use, cognitive skills including , and psychosocial skills including coping strategies and environmental adaptation.   IMPAIRMENTS: are limiting patient from ADLs, IADLs, and leisure.   CO-MORBIDITIES: may have co-morbidities  that affects occupational performance. Patient will benefit from skilled OT to address above impairments and improve overall function.  MODIFICATION OR ASSISTANCE TO COMPLETE EVALUATION: Min-Moderate modification of tasks or assist with assess necessary to complete an evaluation.  OT OCCUPATIONAL PROFILE AND HISTORY:  Detailed assessment: Review of records and additional review of physical, cognitive, psychosocial history related to current functional performance.  CLINICAL DECISION MAKING: Moderate - several treatment options, min-mod task modification necessary  REHAB POTENTIAL: Good for stated goals  PLAN:  OT FREQUENCY 2x's a week  OT DURATION: 12 weeks  PLANNED INTERVENTIONS ADL training, A/E training, UE ther. Ex, Manual therapy, neuromuscular re-education, moist heat modality, Paraffin Bath, Splinting, and  Pt./caregiver education   RECOMMENDED OTHER SERVICES: PT  CONSULTED AND AGREED WITH PLAN OF CARE: Patient  PLAN FOR NEXT SESSION: Continue Treatment as per established POC  Richardson Otter, MS, OTR/L  02/14/2023

## 2023-02-16 ENCOUNTER — Ambulatory Visit: Payer: Medicare HMO

## 2023-02-16 ENCOUNTER — Ambulatory Visit: Payer: Medicare HMO | Admitting: Occupational Therapy

## 2023-02-21 ENCOUNTER — Ambulatory Visit: Payer: Medicare HMO

## 2023-02-21 ENCOUNTER — Ambulatory Visit: Payer: Medicare HMO | Admitting: Occupational Therapy

## 2023-02-23 ENCOUNTER — Ambulatory Visit: Payer: Medicare HMO

## 2023-02-23 ENCOUNTER — Ambulatory Visit: Payer: Medicare HMO | Admitting: Occupational Therapy

## 2023-02-28 ENCOUNTER — Ambulatory Visit: Payer: Medicare HMO | Admitting: Occupational Therapy

## 2023-02-28 ENCOUNTER — Ambulatory Visit: Payer: Medicare HMO

## 2023-02-28 DIAGNOSIS — S14129S Central cord syndrome at unspecified level of cervical spinal cord, sequela: Secondary | ICD-10-CM

## 2023-02-28 DIAGNOSIS — M6281 Muscle weakness (generalized): Secondary | ICD-10-CM

## 2023-02-28 DIAGNOSIS — S72001A Fracture of unspecified part of neck of right femur, initial encounter for closed fracture: Secondary | ICD-10-CM

## 2023-02-28 DIAGNOSIS — R269 Unspecified abnormalities of gait and mobility: Secondary | ICD-10-CM

## 2023-02-28 DIAGNOSIS — R278 Other lack of coordination: Secondary | ICD-10-CM

## 2023-02-28 DIAGNOSIS — R2689 Other abnormalities of gait and mobility: Secondary | ICD-10-CM

## 2023-02-28 DIAGNOSIS — R262 Difficulty in walking, not elsewhere classified: Secondary | ICD-10-CM

## 2023-02-28 NOTE — Therapy (Signed)
OUTPATIENT PHYSICAL THERAPY NEURO TREATMENT  Patient Name: Sherry [name] MRN: [id number] DOB:08/13/1934, 89 y.o., female Today's Date: 02/28/2023   PCP: Earnestine Mealing, MD REFERRING PROVIDER: Deeann Saint   PT End of Session - 02/28/23 1152     Visit Number 95    Number of Visits 112    Date for PT Re-Evaluation 03/23/23    Authorization Type [employer] FOTO performed by PT on eval (7/20), score 12, Progress note on 03/10/2020; PN on 08/20/2020    Authorization Time Period Initial cert 03/03/8655-84/69/6295; Recert 01/11/2022-04/05/2022; Recert 04/07/2022-06/30/2022    Progress Note Due on Visit 100    PT Start Time 1146    PT Stop Time 1225    PT Time Calculation (min) 39 min    Equipment Utilized During Treatment Gait belt    Activity Tolerance Patient tolerated treatment well;No increased pain;Patient limited by fatigue    Behavior During Therapy [facility] for tasks assessed/performed               Past Medical History:  Diagnosis Date   Acute blood loss anemia    Arthritis    Cancer (HCC)    skin   Central cord syndrome at C6 level of cervical spinal cord (HCC) 11/29/2017   Hypertension    Protein-calorie malnutrition, severe (HCC) 01/24/2018   S/P insertion of IVC (inferior vena caval) filter 01/24/2018   Tetraparesis (HCC)    Past Surgical History:  Procedure Laterality Date   ANTERIOR CERVICAL DECOMP/DISCECTOMY FUSION N/A 11/29/2017   Procedure: Cervical five-six, six-seven Anterior Cervical Decompression Fusion;  Surgeon: Jadene Pierini, MD;  Location: [facility] OR;  Service: Neurosurgery;  Laterality: N/A;  Cervical five-six, six-seven Anterior Cervical Decompression Fusion   CATARACT EXTRACTION     FEMUR IM NAIL Left 08/16/2021   Procedure: INTRAMEDULLARY (IM) NAIL FEMORAL;  Surgeon: Deeann Saint, MD;  Location: [facility] ORS;  Service: Orthopedics;  Laterality: Left;   IR IVC FILTER PLMT / S&I /IMG GUID/MOD SED  12/08/2017   Patient Active Problem List    Diagnosis Date Noted   Age-related osteoporosis without current pathological fracture 07/27/2022   Mild recurrent major depression (HCC) 07/27/2022   Hypertensive heart disease with other congestive heart failure (HCC) 02/14/2022   Chronic indwelling Foley catheter 02/14/2022   Closed hip fracture (HCC) 08/15/2021   DVT (deep venous thrombosis) (HCC) 08/15/2021   Quadriplegia (HCC) 08/15/2021   Fall 08/15/2021   Constipation due to slow transit 08/31/2018   Trauma 06/05/2018   Neuropathic pain 06/05/2018   Neurogenic bowel 06/05/2018   Vaginal yeast infection 01/30/2018   Healthcare-associated pneumonia 01/25/2018   Chronic allergic rhinitis 01/24/2018   Depression with anxiety 01/24/2018   UTI due to Klebsiella species 01/24/2018   Protein-calorie malnutrition, severe (HCC) 01/24/2018   S/P insertion of IVC (inferior vena caval) filter 01/24/2018   Tetraparesis (HCC) 01/20/2018   Neurogenic bladder 01/20/2018   Reactive depression    Benign essential HTN    Acute postoperative anemia due to expected blood loss    Central cord syndrome at C6 level of cervical spinal cord (HCC) 11/29/2017   Allergy to alpha-gal 11/25/2016   SCC (squamous cell carcinoma) 04/08/2014    ONSET DATE: 08/15/2021 (fall with B hip fx); Initial injury was in 2019- quadraplegia due to central cord syndrome of C6 secondary to neck fracture.   REFERRING DIAG: Bilateral Hip fx; Left Open- s/p Left IM nail femoral on 08/16/2021; Right closed  THERAPY DIAG:  Muscle weakness (generalized)  Other lack of  coordination  Central cord syndrome, sequela (HCC)  Other abnormalities of gait and mobility  Difficulty in walking, not elsewhere classified  Closed fracture of both hips, initial encounter (HCC)  Abnormality of gait and mobility  Rationale for Evaluation and Treatment Rehabilitation  SUBJECTIVE:   SUBJECTIVE STATEMENT:   Patient reports stayed out last week due to outbreak of Norovirus at her  facility. States doing okay today with any complaints of pain.    Pt accompanied by:  Paid caregiver-   PERTINENT HISTORY: Sherry [name] is an 87yoF who experienced a fall at home on 08/15/2021 with hip fracture, s/p Left hip ORIF. PMH: quadriplegia due to central cord syndrome of C6 secondary to neck fracture, HTN, depression with anxiety, neurogenic bladder, bilateral DVT on Xarelto (s/p of IVC filter placement). Prior history of significant PT/OT with improvement of function. Pt has full use of shoulder/elbows, limited use of hands, adaptive self feeding sucessful.     PAIN:  None current but does endorse some left thigh pain intermittent  OBJECTIVE:    TODAY'S TREATMENT:  02/28/23     THEREX: all performed power w/c today   -Active assisted seated marching 2 x 15 each LE -Seated LAQ 2 x 15 each LE     THERAPEUTIC ACTIVITIES:    -Sit to stand in // bars - max assist to stand  -Static standing in // bars with Min - mod assist at trunk right knee blocked x 5 attempts today- from 1 min 22 to just over 2 min at best today- stopped each trial due to fatigue.      PATIENT EDUCATION: Education details: cues for isolated joint exercises  HOME EXERCISE PROGRAM: No changes at this time  Access Code: [facility] URL: [email or web address] Date: 11/23/2021 Prepared by: Precious Bard Exercises - Seated Gluteal Sets - 2 x daily - 7 x weekly - 2 sets - 10 reps - 5 hold - Seated Quad Set - 2 x daily - 7 x weekly - 2 sets - 10 reps - 5 hold - Seated Long Arc Quad - 2 x daily - 7 x weekly - 2 sets - 10 reps - 5 hold - Seated March - 2 x daily - 7 x weekly - 2 sets - 10 reps - 5 hold - Seated Hip Abduction - 2 x daily - 7 x weekly - 2 sets - 10 reps - 5 hold - Seated Shoulder Shrugs - 2 x daily - 7 x weekly - 2 sets - 10 reps - 5 hold - Wheelchair Pressure Relief - 2 x daily - 7 x weekly - 2 sets - 10 reps - 5 hold  GOALS: Goals reviewed with patient? Yes  SHORT TERM GOALS: Target  date: 02/22/2022  Pt will be independent with initial UE strengthening HEP in order to improve strength and balance in order to decrease fall risk and improve function at home and work. Baseline: 10/19/2021- patient with no formal UE HEP; 12/02/2021= Patient verbalized knowledge of HEP including use of theraband for UE strengthening.  Goal status: GOAL MET  LONG TERM GOALS: Target date: 03/23/2023  Pt will be independent with final for UE/LE HEP in order to improve strength and balance in order to decrease fall risk and improve function at home and work. Baseline: Patient is currently BLE NWB and unable to participate in HEP. Has order for UE strengthening. 01/11/2022- Patient now able to participate in LE strengthening although NWB still- good understanding for some basic exercises. Will keep goal active  to incorporate progressive LE strengthening exercises. 04/07/2022= Patient still participating in progressive LE seated HEP and has no questions at this time.  Goal status: MET  2.  Pt will improve FOTO to target score of 35  to display perceived improvements in ability to complete ADL's.  Baseline: 10/19/2021= 12; 12/02/2021= 17; 01/11/2022=17; 04/07/2022= 17; 07/05/2022=17; 10/06/2022- outcome measure not appropriate as patient in non-ambulatory and not consistently standing. Goal status: Goal not appropriate  3.  Pt will increase strength of B UE  by at least 1/2 MMT grade in order to demonstrate improvement in strength and function  Baseline: patient has range of 2-/5 to 4/5 BUE Strength; 12/02/2021= 4/5 except with wrist ext. 01/11/2022= 4/5 B UE strength expept for wrist Goal status: Goal revised-no longer appropriate- working with OT on all UE strengthening.   4. Pt. Will increase strength of RLE by at least 1/2 MMT grade in order to demonstrate improvement in Standing/transfers.  Baseline: 2-/5 Right hip flex/knee ext/flex; 04/07/2022= 2- with right hip flex/knee ext (lacking 28 deg from zero) 4/8=  Patient able to ext right knee lacking 18 deg from zero. 07/05/2022=Left knee approx 8 deg from zero and right knee = 23 deg from zero; 12/29/2022 - Left LE = full ROM 4-/5 and right knee ext= 23 deg from zero= 2+/5 (gravity minimized)   Goal status: Progressing  5. Pt. Will demo ability to stand pivot transfer with max assist for improved functional mobility and less dependent need on mechanical device.   Baseline: Dependent on hoyer lift for all transfers; 04/07/2022- Unable to test secondary to patient with recent UTI and right hand procedure - will attempt to reassess next visit. ; 04/12/22: Unable to successfully stand due to feet plates on loaned power w/c; 05/10/2022- Patient still having to use rental chair and has not received her original power w/c back to practice SPT. 07/04/2021= Patient just received new power w/c and attempted standing from 1st time today- Patient able to stand with max assist today from w/c and did not attempt to perform pivot transfer- difficulty with static standing and placing weight on right LE. 10/06/2022- Not assessed today- patient needs new AFO prior to attempting transfers; 12/29/2022= Patient has not attempted SPT yet - still trying to improve LE strength enough to stand well without knee buckling with weight shifting.   Goal status: ONGOING  6. Pt. Will demonstrate improved functional LE strength as seen by ability to stand > 2 min for improved transfer ability and pregait abilities.   Baseline: not assessed today due to recent dx: UTI; 04/07/2022- Unable to test secondary to patient with recent UTI and right hand procedure - will attempt to reassess next visit; 04/12/22: Unable to successfully stand due to feet plates on loaned power w/c 05/10/2022- Patient still having to use rental chair and has not received her original power w/c back to practice Standing. 07/05/2022- Patient able to stand with max assist at trunk today for 2 min 3 sec. Will keep goal active to ensure  consistency. 10/06/2022= Unable to assess today- patient with some recent blood pressure issues and needs new AFO. 11/24/2022= Patient demonstrated multiple rounds of standing in // bars- holding up to 1:45 min today but has been able to exhibit 2 min in recent past- working on consistency. 12/29/2022= 2 min 40 sec with patient demonstrating improved overall posture and ability to contract glutes and quads with just min assist at trunk today.   Goal status: MET  7. Pt. Will demonstrate  improved functional LE strength as seen by ability to stand > 5 min for improved transfer ability and pregait abilities. Baseline: 12/29/2022= 2 min 40 sec with patient demonstrating improved overall posture and ability to contract glutes and quads with just min assist at trunk today.  Goal Status: NEW  8. Patient will perform sit to stand transfer with moderate assist consistently > 75% of time for improved transfer ability and less dependence on caregiver.   Baseline: 12/29/2022- Patient currently max assist level assistance with all sit to stand activities.   Goal status: New   ASSESSMENT:  CLINICAL IMPRESSION:   Patient returned to standing after missing several visits.  She was in good spirits and despite lack of standing in recent weeks- she presented as motivated and performed well with weakness and fatigue as limiting factor. She was openly disappointed stating she thought she would do better but again motivated to perform her best- responsive to all VC for best standing techniques.  Pt will continue to benefit from skilled PT services to optimize independence and reduced caregiver support.    OBJECTIVE IMPAIRMENTS Abnormal gait, decreased activity tolerance, decreased balance, decreased coordination, decreased endurance, decreased mobility, difficulty walking, decreased ROM, decreased strength, hypomobility, impaired flexibility, impaired UE functional use, postural dysfunction, and pain.   ACTIVITY  LIMITATIONS carrying, lifting, bending, sitting, standing, squatting, sleeping, stairs, transfers, bed mobility, continence, bathing, toileting, dressing, self feeding, reach over head, hygiene/grooming, and caring for others  PARTICIPATION LIMITATIONS: meal prep, cleaning, laundry, medication management, personal finances, interpersonal relationship, driving, shopping, community activity, and yard work  PERSONAL FACTORS Age, Time since onset of injury/illness/exacerbation, and 1-2 comorbidities: HTN, cervical Sx  are also affecting patient's functional outcome.   REHAB POTENTIAL: Good  CLINICAL DECISION MAKING: Evolving/moderate complexity  EVALUATION COMPLEXITY: Moderate  PLAN: PT FREQUENCY: 1-2x/week  PT DURATION: 12 weeks  PLANNED INTERVENTIONS: Therapeutic exercises, Therapeutic activity, Neuromuscular re-education, Balance training, Gait training, Patient/Family education, Self Care, Joint mobilization, DME instructions, Dry Needling, Electrical stimulation, Wheelchair mobility training, Spinal mobilization, Cryotherapy, Moist heat, and Manual therapy  PLAN FOR NEXT SESSION:  LE and core strength training, Sit to stand and static standing; progress to SPT as able.     1:09 PM, 02/28/23 Louis Meckel, PT Physical Therapist - [facility]  Outpatient Physical Therapy- [facility] [phone]

## 2023-02-28 NOTE — Therapy (Addendum)
Occupational Therapy Neuro Treatment Note        Patient Name: Sherry [name] MRN: [id number] DOB:Oct 26, 1934, 89 y.o., female Today's Date: 11/29/2022  PCP:  Cindi Carbon PROVIDER: Earnestine Mealing, MD  END OF SESSION:   OT End of Session - 02/28/23 1522     Visit Number 103    Date for OT Re-Evaluation 04/13/23    Authorization Time Period Progress report period starting 0812/2024    OT Start Time 1100    OT Stop Time 1145    OT Time Calculation (min) 45 min    Equipment Utilized During Treatment powered wheelchair    Activity Tolerance Patient tolerated treatment well    Behavior During Therapy WFL for tasks assessed/performed                 Past Medical History:  Diagnosis Date   Acute blood loss anemia    Arthritis    Cancer (HCC)    skin   Central cord syndrome at C6 level of cervical spinal cord (HCC) 11/29/2017   Hypertension    Protein-calorie malnutrition, severe (HCC) 01/24/2018   S/P insertion of IVC (inferior vena caval) filter 01/24/2018   Tetraparesis (HCC)    Past Surgical History:  Procedure Laterality Date   ANTERIOR CERVICAL DECOMP/DISCECTOMY FUSION N/A 11/29/2017   Procedure: Cervical five-six, six-seven Anterior Cervical Decompression Fusion;  Surgeon: Jadene Pierini, MD;  Location: [facility] OR;  Service: Neurosurgery;  Laterality: N/A;  Cervical five-six, six-seven Anterior Cervical Decompression Fusion   CATARACT EXTRACTION     FEMUR IM NAIL Left 08/16/2021   Procedure: INTRAMEDULLARY (IM) NAIL FEMORAL;  Surgeon: Deeann Saint, MD;  Location: [facility] ORS;  Service: Orthopedics;  Laterality: Left;   IR IVC FILTER PLMT / S&I /IMG GUID/MOD SED  12/08/2017   Patient Active Problem List   Diagnosis Date Noted   Age-related osteoporosis without current pathological fracture 07/27/2022   Mild recurrent major depression (HCC) 07/27/2022   Hypertensive heart disease with other congestive heart failure (HCC) 02/14/2022   Chronic indwelling Foley  catheter 02/14/2022   Closed hip fracture (HCC) 08/15/2021   DVT (deep venous thrombosis) (HCC) 08/15/2021   Quadriplegia (HCC) 08/15/2021   Fall 08/15/2021   Constipation due to slow transit 08/31/2018   Trauma 06/05/2018   Neuropathic pain 06/05/2018   Neurogenic bowel 06/05/2018   Vaginal yeast infection 01/30/2018   Healthcare-associated pneumonia 01/25/2018   Chronic allergic rhinitis 01/24/2018   Depression with anxiety 01/24/2018   UTI due to Klebsiella species 01/24/2018   Protein-calorie malnutrition, severe (HCC) 01/24/2018   S/P insertion of IVC (inferior vena caval) filter 01/24/2018   Tetraparesis (HCC) 01/20/2018   Neurogenic bladder 01/20/2018   Reactive depression    Benign essential HTN    Acute postoperative anemia due to expected blood loss    Central cord syndrome at C6 level of cervical spinal cord (HCC) 11/29/2017   Allergy to alpha-gal 11/25/2016   SCC (squamous cell carcinoma) 04/08/2014   ONSET DATE: 11/29/2017  REFERRING DIAG: Central Cord Syndrome at C6 of the Cervical Spinal Cord, Fall with Bilateral Closed Hip Fractures, with ORIF repair with Intramedullary Nailing of the Left Hip Fracture.   THERAPY DIAG:  Muscle weakness (generalized)  Other lack of coordination  Rationale for Evaluation and Treatment: Rehabilitation  SUBJECTIVE:   SUBJECTIVE STATEMENT:  Pt. Reports Missing her appointments last week due to wanting to avoid the spread of infection as Norovirus was prevalent at the LTC facility she resides at.   Pt accompanied  by: self, Personal Care aide  PERTINENT HISTORY: Central Cord Syndrome at C6 of the Cervical Spinal Cord, Fall with Bilateral Closed Hip Fractures, with ORIF repair with Intramedullary Nailing of the Left Hip Fracture.   PRECAUTIONS: None  WEIGHT BEARING RESTRICTIONS: No  PAIN:  Are you having pain? No pain, just shoulder stiffness   LIVING ENVIRONMENT: Lives with: [facility]  Stairs:  No Has following equipment at home: Wheelchair (power)  PLOF: Independent   PATIENT GOALS:  See below for established goals  OBJECTIVE:  Note: Objective measures were completed at Evaluation unless otherwise noted.  HAND DOMINANCE: Right  ADLs: Caregiver assist with ADLs, and [facility]  MOBILITY STATUS: Uses a power w/c  ACTIVITY TOLERANCE: Activity tolerance: Fair  FUNCTIONAL OUTCOME MEASURES:  Measurements:   12/09/2021:   Shoulder flexion: R: 120(136), L: 138(150) Shoulder abduction: R: 108(120), L: 110(120) Elbow: R: 0-148, L: 0-146 Wrist flexion: R: 55(62), L: 78 Writ extension: R: 34(55), L: 3(4) RD: R: 12(20), L: 16(26) UD: R: 8(24), L: 6(18) Thumb radial abduction: R: 11(20), L: 8(20) Digit flexion to the [facility]: R:  2nd: 5cm(3.5cm), 3rd: 7.5 cm(5cm), 4th: 7cm(3cm), 5th: 6cm(5.5cm) L: 2nd: 8cm(8cm), 3rd: 8cm(8cm), 4th: 7.5cm(7cm), 5th: 7cm(7cm)   02/03/2022:   Shoulder flexion: R: 122(138), L: 148(154) Shoulder abduction: R: 109(125), L: 125(125 Elbow: R: 0-148, L: 0-146 Wrist flexion: R: 60(68), L: 79 Writ extension: R: 40(55), L: 3(10) RD: R: 14(24), L: 20(28) UD: R: 8(24), L: 6(18) Thumb radial abduction: R: 20(25), L: 8(20) Digit flexion to the [facility]: R:  2nd: 4.5cm(3cm), 3rd: 6.5cm(4.5cm), 4th: 6.5 cm(3cm), 5th: 4.5cm(5cm) L: 2nd: 8cm(8cm), 3rd: 8cm(7cm), 4th: 7.5cm(7cm), 5th: 6.5cm(6.5cm)   04/07/2022:   Shoulder flexion: R: 125(150), L: 160(160) Shoulder abduction: R: 109(125), L: 125(125 Elbow: R: 0-148, L: 0-146 Wrist flexion: R: 60(68), L: 79 Writ extension: R: 40(55), L: 5(18) RD: R: 14(24), L: 20(28) UD: R: 18(24), L: 8(18) Thumb radial abduction: R: 20(25), L: 8(20) Digit flexion to the [facility]: R:  2nd: 4.5 cm(4 cm), 3rd: 6.5cm(3cm), 4th: 7 cm (5cm), 5th: 5cm(5cm) L: 2nd: 8cm(8cm), 3rd: 8cm(7cm), 4th: 7.5cm(7cm), 5th: 7cm(7cm)   07/05/2022:   Shoulder flexion: R: 125(154), L: 160(160) Shoulder abduction: R: 95(130), L:  143(143) Elbow: R: 0-148, L: 0-146 Wrist flexion: R: 60(68), L: 79 Writ extension: R: 40(60), L: 0(12) RD: R: 10(24), L: 12(20) UD: R: 16(24), L: 8(16) Thumb radial abduction: R: 20(25), L: 8(20) Digit flexion to the [facility]: R:  2nd: 4 cm(4cm), 3rd: 6.5cm( 3.5cm), 4th: 7 cm (5cm), 5th: 6cm(5cm) L: 2nd: 8cm(8cm), 3rd: 8cm(7cm), 4th: 8cm(7cm), 5th: 7cm(7cm)   09/13/2022:   Shoulder flexion: R: 125(154), L: 160(160) Shoulder abduction: R: 100(130), L: 143(143) Elbow: R: 0-150, L: 0-150 Wrist flexion: R: 55(68), L: 79 Writ extension: R: 45(60), L: 0(12) RD: R: 10(24), L: 12(20) UD: R: 16(24), L: 8(16) Thumb radial abduction: R: 20(25), L: 8(20) Digit flexion to the [facility]: R:  2nd: 4 cm(4cm), 3rd: 6cm( 5cm), 4th: 6 cm (4cm), 5th: 5cm(5cm) L: 2nd: 7cm(cm), 3rd: 7cm(6cm), 4th: 7cm(6cm), 5th: 6cm(6cm)   10/27/2022   Shoulder flexion: R: 128(154), L: 160(160) Shoulder abduction: R: 105(130), L: 143(143) Elbow: R: 0-150, L: 0-150 Wrist flexion: R: 55(68), L: 79 Writ extension: R: 46(64), L: 0(20) RD: R: 10(20), L: 18(24) UD: R: 118(28), L: 8(20) Thumb radial abduction: R: 30(42), L:20(24) Digit flexion to the [facility]: R:  2nd: 4 cm(3cm), 3rd: 6cm( 6cm), 4th: 6 cm (5cm), 5th: 5cm(5cm) L: 2nd: 8cm(8cm), 3rd:  8cm(6cm), 4th: 8cm(7cm), 5th: 6cm(6cm)  01/19/2023  Shoulder flexion: R: 131(154), L: 160(160) Shoulder abduction: R: 110(130), L: 143(143) Elbow: R: 0-150, L: 0-150 Wrist flexion: R: 55(68), L: 79 Writ extension: R: 46(64), L: 0(20) RD: R: 10(20), L: 18(24) UD: R: 118(28), L: 14(20) Thumb radial abduction: R: 38(48), L:24(26) Thumb IP flexion: R: 35(50) L: 20(45) Digit flexion to the [facility]: R:  2nd: 3.5 cm(3cm), 3rd: 6cm( 6cm), 4th: 6 cm (5cm), 5th: 5cm(4.5cm) L: 2nd: 8cm(7.5cm), 3rd: 8cm(6cm), 4th: 7.5cm(6cm), 5th: 6cm(4.5cm)  COORDINATION:   01/19/2023:  9 hole peg test:  Right: 2 pegs placed in 1 min. & 5 sec.  Pt. was able to remove 9 pegs in 17 sec.  Left: 2 pegs placed  in 1 min. & 10 sec. Pt. was able to remove 9 pegs in 20 sec.  SENSATION: Intact  EDEMA: N/A   COGNITION: Overall cognitive status: WNL   VISION: Subjective report: Wears glasses, No changes in vision.  PERCEPTION: Intact  TODAY'S TREATMENT:                                                                                                                              DATE: 01/19/2023    Manual Therapy:   Pt. tolerated soft tissue massage to the volar, and dorsal surface of each digit on the bilateral hands, digit lateral, and sagittal bands 2/2 to stiffness following moist heat modality. Soft tissue mobilization was performed for carpal, and metacarpal spread stretches. Manual therapy was performed independent of, and in preparation for therapeutic Ex.     Therapeutic Ex.    Pt. tolerated PROM followed by AROM bilateral wrist extension, PROM for bilateral digit MP, PIP, and DIP flexion, and extension, thumb abduction. Pt. Worked on BUE strengthening, and reciprocal motion using the UBE while seated for 8 min. with no resistance.    PATIENT EDUCATION: Education details: Bilateral hand function, ROM, and strengthening Person educated: Patient Education method: Explanation, Demonstration, and Verbal cues Education comprehension: verbalized understanding and returned demonstration  HOME EXERCISE PROGRAM: Continue to assess HEP needs, and provide as needed.   GOALS: Goals reviewed with patient? Yes  LONG TERM GOALS: Target date: 04/13/2023  3.  Patient will demonstrate improved composite finger flexion to be able to firmly hold and use adaptive devices during ADLs, and IADLs.  Baseline: 01/19/2023: Pt. Is improving with Bilateral digit flexion to the [facility]. Pt. has difficulty firmly holding adaptive devices. 12/06/2022: Pt. continues to present with increased MP, PIP, and DIP digit tightness/stiffness limiting the formulation of bilateral composites fists. 10/27/2022: Pt. presents with  increased MP, PIP, and DIP digit tightness/stiffness limiting the formulation of bilateral composites fists during this progress reporting period limiting. Digit flexion to the [facility]: R:  2nd: 4 cm(3cm), 3rd: 6cm( 6cm), 4th: 6 cm (5cm), 5th: 5cm(5cm), L: 2nd: 8cm(8cm), 3rd: 8cm(6cm), 4th: 8cm(7cm), 5th: 6cm(6cm) 09/20/2022: Improving digit flexion to the [facility]. 09/13/22: improved digit flexion to the [facility]. 07/05/2022: Pt.  presents with digit MP, PIP, and DIP extensor tightness limiting her ability to securely grip objects in her bilateral hands.  04/07/2022: Pt. Has improved with right 2nd, and 3rd digit flexion towards the [facility]. Pt. Continues to have difficulty securely holding and applying deodorant.02/03/2022: Pt. Has improved with digit flexion, however, continues to have difficulty securely holding and using deodorant. 12/09/2021: Bilateral hand/digit MP, PIP, and DIP extension tightness limits her ability achieve digit flexion to hold, and apply deodorant. 10/28/2021:  Pt. continues to have difficulty holding the deodorant. 01/12/2021: Pt. Presents with limited digit extension. Pt. Is able to initiate holding deodorant, however is unable to hold it while using it  Goal status: Ongoing   4.  Pt. Will improve bilateral wrist extension in preparation for anticipating, and initiating reaching for objects at the table.  Baseline: 01/19/2023:  R: 46(64) L: 0(20) 12/06/2022: Pt continues to to progress with bilateral wrist extension in preparation fro functional reaching. 10/27/2022: Pt. Is improving with bilateral wrist extension. R: 46(64) L: 0(20) 09/20/2022: limited PROM in the Left wrist extension 2/2 tightness/stiffness. 09/13/22: R: 55(68), L: 0(12) 07/19/2022: Bilateral wrist extension in limited. 07/05/2022: Right: 40(60) Left: 0(12) 04/07/2022: Right: 40(55) Left: 5(18) 02/03/2022: Right: 34(55) Left: 3(4) 12/09/2021: Right  34(55)  Left  3(4) 10/28/2021:  Right 22(38), Left 0(15) 09/14/2021:  Right: 17(35), left 2(15)   Goal status: Limited progress with wrist extension  this progress reporting period. Ongoing  7.  Pt. Will increase bilateral lateral pinch strength by 2 lbs to be able to securely grasp items during ADLs, and IADL tasks.  Baseline: 01/19/2023: Deferred as the Pinch meter is out for calibration.12/06/2022: Pt. Is able to more securely hold objects during ADLs/IADLs 10/27/2022: Pt. Is improving with holding items with her bilateral thumbs. (Pinch meter out for calibration) 09/20/2022: lateral pinch continues to be limited 09/13/22: R 6.5# L 4# 07/19/2022: TBD 07/05/2022: TBD 07/2022: NT-Pinch meter out for calibration. 02/03/2022: Right: 6#, Left: 4# 12/09/2021: Right: 6#, Left: 4#  Goal status: Deferred   9.  Pt. will complete plant care with modified independence.  Baseline:12/06/2022: Pt. Is independent with plant care in her husband's room, however is not able to access her plants due to furniture blocking access to them. 10/27/2022: Pt. Is able to water plants that are closer, and within reach. Pt. continues to have difficulty reaching for thorough plant care. 09/20/2022: Pt. Continues to have difficulty reaching for thorough plant care. 09/13/22: continues to report intermittent difficulty 07/19/2022: Pt. Continues to be able to water, and care for some of her plants. Pt. Has more difficulty with plants that are harder to reach. 07/05/2022: pt. Is able to water, and care for some of her plants. Pt. Has more difficulty with plants that are harder to reach. 04/07/2022: Pt. is now able to hold a cup and water her plants.02/03/2022: Pt. has difficulty caring for her plants.  Goal status:Achieved   10.  Pt. will demonstrate adaptive techniques to assist with the efficiency of self-dressing, or morning care tasks.  Baseline: 01/19/2203: Pt. Is able to donn her jacket in reverse independently. Pt. requires assist from staff, as Pt. Reports staff have decreased time. 12/06/2022: Pt. requires assist from staff, as Pt.  Reports staff have decreased time. 10/27/2022: Pt. is able to assist with initiating UE dressing. Pt. requires assist from personal, and staff care aides. 09/20/2022: Pt. continues to require assist form personal, and staff care aides. 09/13/22: MODA dressing, reports she is often rushed 07/19/2022: Pt. continues to require  assist with self-dressing/morning care tasks. 07/05/2022: Continue with goal. 04/07/2022: Pt. Continues to require assist with the efficiency of self-dressing, and morning care tasks.02/03/2022: Pt. requires assist from caregivers 2/2 time limitations during morning care.  Goal status: Ongoing  11.  Pt. will improve BUE strength to improve ADL, and IADL functioning.  Baseline: 01/19/2023: L 4/5, R 3+/5 12/06/2022: Continue 10/27/2022: shoulder flexion L 4/5, R 3+/5; shoulder abduction L 4+/5, R 3+/5. elbow flexion B 5/5, elbow extension 4/5 09/20/2022: Pt. Presents with limited BUE strength 09/13/22: shoulder flexion L 4/5, R 3/5; elbow flexion B 5/5, elbow extension 4-/5; shoulder abduction L 4+/5, R 3+/5. 07/19/2022: BUE strength continues to be limited. 07/05/2022: shoulder flexion: right 4-/5, abduction: 3+/5, elbow flexion: right: 5/5, left 5/5, extension: right: 4-/5, left 4-/5, wrist extension: right: 3-/5, left: 2-/5  Goal status: Ongoing  12.  Pt. will improve bilateral thumb radial abduction in order to be able to hold the grab bars while standing with PT  Baseline: 01/19/2023: R: 38(48) L: 24(26)12/06/2022: Pt. Continues to present  with limited thumb abduction, however is improving holding onto the parallel bars.10/27/2022: thumb radial abduction: R: 30(42), L: 20(24  Goal status: Ongoing  13.  Pt. Will be able to securely hold, and stabilize medication bottles at a tabletop surface when opening, and closing them.  Baseline: 01/19/2023: Pt. Is now able to securely hold medication bottles while opening them, and stabilizes them on surfaces while opening them. 12/06/2022: Pt is improving  with securely stabilizing medication bottles. 10/27/2022: Pt. Stabilizes bottles against her torso when attempting to open, and close them   Goal status: Achieved  14: Pt. Will improve bilateral thumb IP flexion to improve active grasping patterns.    Baseline: 01/19/2023: Right 35(50), Left: 20/45)    Goal status: New    15. Pt. Will improve bilateral FMC/speed, and dexterity. As evidence by improved scores on the 9 hole peg test.   Baseline: 01/19/2023: 2 pegs placed in 1 min. & 5 sec.  Pt. was able to remove 9 pegs in 17 sec.; Left: 2 pegs placed in 1 min. & 10    sec. Pt. was able to remove 9 pegs in 20 sec.   Goal status: New      ASSESSMENT:  CLINICAL IMPRESSION:  Pt. Tolerated the session well, and has no reports of pain. Pt. tolerated UE there. Ex, and manual therapy well, without reports of pain or discomfort. Pt. continues to present with tightness at the bilateral thumb webspace, bilateral wrist flexor tightness, as well as digit MP, PIP, and DIP extensor tightness which continues to limit her ability to complete ADL tasks efficiently. Pt. tolerated  the UBE for 8 min. with no rest breaks required. Pt. continues to benefit from working on impoving ROM, and UE strength in order to work towards increasing bilateral hand grasp on objects, and increasing engagement of bilateral hands during ADLs, and IADL tasks. Plan to complete the progress report, and the recertification next visit.     PERFORMANCE DEFICITS: in functional skills including ADLs, IADLs, coordination, dexterity, ROM, strength, and UE functional use, cognitive skills including , and psychosocial skills including coping strategies and environmental adaptation.   IMPAIRMENTS: are limiting patient from ADLs, IADLs, and leisure.   CO-MORBIDITIES: may have co-morbidities  that affects occupational performance. Patient will benefit from skilled OT to address above impairments and improve overall function.  MODIFICATION OR  ASSISTANCE TO COMPLETE EVALUATION: Min-Moderate modification of tasks or assist with assess necessary to complete an  evaluation.  OT OCCUPATIONAL PROFILE AND HISTORY: Detailed assessment: Review of records and additional review of physical, cognitive, psychosocial history related to current functional performance.  CLINICAL DECISION MAKING: Moderate - several treatment options, min-mod task modification necessary  REHAB POTENTIAL: Good for stated goals  PLAN:  OT FREQUENCY 2x's a week  OT DURATION: 12 weeks  PLANNED INTERVENTIONS ADL training, A/E training, UE ther. Ex, Manual therapy, neuromuscular re-education, moist heat modality, Paraffin Bath, Splinting, and  Pt./caregiver education   RECOMMENDED OTHER SERVICES: PT  CONSULTED AND AGREED WITH PLAN OF CARE: Patient  PLAN FOR NEXT SESSION: Continue Treatment as per established POC  Olegario Messier, MS, OTR/L  02/28/2023

## 2023-03-02 ENCOUNTER — Ambulatory Visit: Payer: Medicare HMO | Admitting: Occupational Therapy

## 2023-03-02 ENCOUNTER — Ambulatory Visit: Payer: Medicare HMO

## 2023-03-07 ENCOUNTER — Ambulatory Visit: Payer: Medicare HMO | Attending: Internal Medicine | Admitting: Occupational Therapy

## 2023-03-07 ENCOUNTER — Ambulatory Visit: Payer: Medicare HMO

## 2023-03-07 DIAGNOSIS — R296 Repeated falls: Secondary | ICD-10-CM | POA: Insufficient documentation

## 2023-03-07 DIAGNOSIS — R269 Unspecified abnormalities of gait and mobility: Secondary | ICD-10-CM | POA: Insufficient documentation

## 2023-03-07 DIAGNOSIS — R2689 Other abnormalities of gait and mobility: Secondary | ICD-10-CM | POA: Diagnosis present

## 2023-03-07 DIAGNOSIS — S72001A Fracture of unspecified part of neck of right femur, initial encounter for closed fracture: Secondary | ICD-10-CM | POA: Insufficient documentation

## 2023-03-07 DIAGNOSIS — S14129S Central cord syndrome at unspecified level of cervical spinal cord, sequela: Secondary | ICD-10-CM

## 2023-03-07 DIAGNOSIS — R2681 Unsteadiness on feet: Secondary | ICD-10-CM | POA: Diagnosis present

## 2023-03-07 DIAGNOSIS — M6281 Muscle weakness (generalized): Secondary | ICD-10-CM | POA: Diagnosis present

## 2023-03-07 DIAGNOSIS — S72002A Fracture of unspecified part of neck of left femur, initial encounter for closed fracture: Secondary | ICD-10-CM | POA: Diagnosis present

## 2023-03-07 DIAGNOSIS — R278 Other lack of coordination: Secondary | ICD-10-CM | POA: Insufficient documentation

## 2023-03-07 DIAGNOSIS — M542 Cervicalgia: Secondary | ICD-10-CM | POA: Diagnosis present

## 2023-03-07 DIAGNOSIS — R262 Difficulty in walking, not elsewhere classified: Secondary | ICD-10-CM | POA: Insufficient documentation

## 2023-03-07 NOTE — Therapy (Signed)
OUTPATIENT PHYSICAL THERAPY NEURO TREATMENT  Patient Name: Sherry [name] MRN: [id number] DOB:[birth year], 89 y.o., female Today's Date: 03/07/2023   PCP: Sherry Mealing, MD REFERRING PROVIDER: Deeann [name]   PT End of Session - 03/07/23 1151     Visit Number 96    Number of Visits 112    Date for PT Re-Evaluation 03/23/23    Authorization Type [employer] FOTO performed by PT on eval (7/20), score 12, Progress note on 03/10/2020; PN on 08/20/2020    Authorization Time Period Initial cert 9/52/8413-24/40/1027; Recert 01/11/2022-04/05/2022; Recert 04/07/2022-06/30/2022    Progress Note Due on Visit 100    PT Start Time 1145    PT Stop Time 1227    PT Time Calculation (min) 42 min    Equipment Utilized During Treatment Gait belt    Activity Tolerance Patient tolerated treatment well    Behavior During Therapy WFL for tasks assessed/performed                Past Medical History:  Diagnosis Date   Acute blood loss anemia    Arthritis    Cancer (HCC)    skin   Central cord syndrome at C6 level of cervical spinal cord (HCC) 11/29/2017   Hypertension    Protein-calorie malnutrition, severe (HCC) 01/24/2018   S/P insertion of IVC (inferior vena caval) filter 01/24/2018   Tetraparesis (HCC)    Past Surgical History:  Procedure Laterality Date   ANTERIOR CERVICAL DECOMP/DISCECTOMY FUSION N/A 11/29/2017   Procedure: Cervical five-six, six-seven Anterior Cervical Decompression Fusion;  Surgeon: Sherry Pierini, MD;  Location: [facility] OR;  Service: Neurosurgery;  Laterality: N/A;  Cervical five-six, six-seven Anterior Cervical Decompression Fusion   CATARACT EXTRACTION     FEMUR IM NAIL Left 08/16/2021   Procedure: INTRAMEDULLARY (IM) NAIL FEMORAL;  Surgeon: Sherry Saint, MD;  Location: [facility] ORS;  Service: Orthopedics;  Laterality: Left;   IR IVC FILTER PLMT / S&I /IMG GUID/MOD SED  12/08/2017   Patient Active Problem List   Diagnosis Date Noted   Age-related osteoporosis  without current pathological fracture 07/27/2022   Mild recurrent major depression (HCC) 07/27/2022   Hypertensive heart disease with other congestive heart failure (HCC) 02/14/2022   Chronic indwelling Foley catheter 02/14/2022   Closed hip fracture (HCC) 08/15/2021   DVT (deep venous thrombosis) (HCC) 08/15/2021   Quadriplegia (HCC) 08/15/2021   Fall 08/15/2021   Constipation due to slow transit 08/31/2018   Trauma 06/05/2018   Neuropathic pain 06/05/2018   Neurogenic bowel 06/05/2018   Vaginal yeast infection 01/30/2018   Healthcare-associated pneumonia 01/25/2018   Chronic allergic rhinitis 01/24/2018   Depression with anxiety 01/24/2018   UTI due to Klebsiella species 01/24/2018   Protein-calorie malnutrition, severe (HCC) 01/24/2018   S/P insertion of IVC (inferior vena caval) filter 01/24/2018   Tetraparesis (HCC) 01/20/2018   Neurogenic bladder 01/20/2018   Reactive depression    Benign essential HTN    Acute postoperative anemia due to expected blood loss    Central cord syndrome at C6 level of cervical spinal cord (HCC) 11/29/2017   Allergy to alpha-gal 11/25/2016   SCC (squamous cell carcinoma) 04/08/2014    ONSET DATE: 08/15/2021 (fall with B hip fx); Initial injury was in 2019- quadraplegia due to central cord syndrome of C6 secondary to neck fracture.   REFERRING DIAG: Bilateral Hip fx; Left Open- s/p Left IM nail femoral on 08/16/2021; Right closed  THERAPY DIAG:  Muscle weakness (generalized)  Other lack of coordination  Central cord  syndrome, sequela (HCC)  Other abnormalities of gait and mobility  Difficulty in walking, not elsewhere classified  Closed fracture of both hips, initial encounter (HCC)  Abnormality of gait and mobility  Repeated falls  Unsteadiness on feet  Rationale for Evaluation and Treatment Rehabilitation  SUBJECTIVE:   SUBJECTIVE STATEMENT:   Patient reports doing well overall and states no complaints. States no caregiver  presents to assist with standing.     Pt accompanied by:  Paid caregiver-   PERTINENT HISTORY: Sherry [name] is an 87yoF who experienced a fall at home on 08/15/2021 with hip fracture, s/p Left hip ORIF. PMH: quadriplegia due to central cord syndrome of C6 secondary to neck fracture, HTN, depression with anxiety, neurogenic bladder, bilateral DVT on Xarelto (s/p of IVC filter placement). Prior history of significant PT/OT with improvement of function. Pt has full use of shoulder/elbows, limited use of hands, adaptive self feeding sucessful.     PAIN:  None current but does endorse some left thigh pain intermittent  OBJECTIVE:    TODAY'S TREATMENT:  03/07/23     THERAPEUTIC ACTIVITIES -Ankle DF/PF- 2 sets of 15 reps for optimal ROM and strength  -Active assisted seated march and swing out to side of leg rest-2 x 15 each LE to assist with lifting LE up and off foot rest and with standing/ability to lift LE to pivot.  -Seated LAQ 2 x 15 each LE with Terminal knee ext (static hold x 3 sec) to simulate blocking knees  -Long sit Quad set- hold 3 sec x 15 reps each to simulate terminal knee ext with standing.  - Seated thoracic rotation with UE reaching to contralateral side - 2 sets of 15 reps.      Therex:   Hip add with ball- hold 5 sec - 2 x 10 reps    - Manual resistance Leg press (long sitting) - x 15 reps-   PATIENT EDUCATION: Education details: cues for isolated joint exercises  HOME EXERCISE PROGRAM: No changes at this time  Access Code: [facility] URL: [email or web address] Date: 11/23/2021 Prepared by: Sherry [name] Exercises - Seated Gluteal Sets - 2 x daily - 7 x weekly - 2 sets - 10 reps - 5 hold - Seated Quad Set - 2 x daily - 7 x weekly - 2 sets - 10 reps - 5 hold - Seated Long Arc Quad - 2 x daily - 7 x weekly - 2 sets - 10 reps - 5 hold - Seated March - 2 x daily - 7 x weekly - 2 sets - 10 reps - 5 hold - Seated Hip Abduction - 2 x daily - 7 x weekly - 2  sets - 10 reps - 5 hold - Seated Shoulder Shrugs - 2 x daily - 7 x weekly - 2 sets - 10 reps - 5 hold - Wheelchair Pressure Relief - 2 x daily - 7 x weekly - 2 sets - 10 reps - 5 hold  GOALS: Goals reviewed with patient? Yes  SHORT TERM GOALS: Target date: 02/22/2022  Pt will be independent with initial UE strengthening HEP in order to improve strength and balance in order to decrease fall risk and improve function at home and work. Baseline: 10/19/2021- patient with no formal UE HEP; 12/02/2021= Patient verbalized knowledge of HEP including use of theraband for UE strengthening.  Goal status: GOAL MET  LONG TERM GOALS: Target date: 03/23/2023  Pt will be independent with final for UE/LE HEP in order to improve strength and  balance in order to decrease fall risk and improve function at home and work. Baseline: Patient is currently BLE NWB and unable to participate in HEP. Has order for UE strengthening. 01/11/2022- Patient now able to participate in LE strengthening although NWB still- good understanding for some basic exercises. Will keep goal active to incorporate progressive LE strengthening exercises. 04/07/2022= Patient still participating in progressive LE seated HEP and has no questions at this time.  Goal status: MET  2.  Pt will improve FOTO to target score of 35  to display perceived improvements in ability to complete ADL's.  Baseline: 10/19/2021= 12; 12/02/2021= 17; 01/11/2022=17; 04/07/2022= 17; 07/05/2022=17; 10/06/2022- outcome measure not appropriate as patient in non-ambulatory and not consistently standing. Goal status: Goal not appropriate  3.  Pt will increase strength of B UE  by at least 1/2 MMT grade in order to demonstrate improvement in strength and function  Baseline: patient has range of 2-/5 to 4/5 BUE Strength; 12/02/2021= 4/5 except with wrist ext. 01/11/2022= 4/5 B UE strength expept for wrist Goal status: Goal revised-no longer appropriate- working with OT on all UE  strengthening.   4. Pt. Will increase strength of RLE by at least 1/2 MMT grade in order to demonstrate improvement in Standing/transfers.  Baseline: 2-/5 Right hip flex/knee ext/flex; 04/07/2022= 2- with right hip flex/knee ext (lacking 28 deg from zero) 4/8= Patient able to ext right knee lacking 18 deg from zero. 07/05/2022=Left knee approx 8 deg from zero and right knee = 23 deg from zero; 12/29/2022 - Left LE = full ROM 4-/5 and right knee ext= 23 deg from zero= 2+/5 (gravity minimized)   Goal status: Progressing  5. Pt. Will demo ability to stand pivot transfer with max assist for improved functional mobility and less dependent need on mechanical device.   Baseline: Dependent on hoyer lift for all transfers; 04/07/2022- Unable to test secondary to patient with recent UTI and right hand procedure - will attempt to reassess next visit. ; 04/12/22: Unable to successfully stand due to feet plates on loaned power w/c; 05/10/2022- Patient still having to use rental chair and has not received her original power w/c back to practice SPT. 07/04/2021= Patient just received new power w/c and attempted standing from 1st time today- Patient able to stand with max assist today from w/c and did not attempt to perform pivot transfer- difficulty with static standing and placing weight on right LE. 10/06/2022- Not assessed today- patient needs new AFO prior to attempting transfers; 12/29/2022= Patient has not attempted SPT yet - still trying to improve LE strength enough to stand well without knee buckling with weight shifting.   Goal status: ONGOING  6. Pt. Will demonstrate improved functional LE strength as seen by ability to stand > 2 min for improved transfer ability and pregait abilities.   Baseline: not assessed today due to recent dx: UTI; 04/07/2022- Unable to test secondary to patient with recent UTI and right hand procedure - will attempt to reassess next visit; 04/12/22: Unable to successfully stand due to feet plates on  loaned power w/c 05/10/2022- Patient still having to use rental chair and has not received her original power w/c back to practice Standing. 07/05/2022- Patient able to stand with max assist at trunk today for 2 min 3 sec. Will keep goal active to ensure consistency. 10/06/2022= Unable to assess today- patient with some recent blood pressure issues and needs new AFO. 11/24/2022= Patient demonstrated multiple rounds of standing in // bars- holding up  to 1:45 min today but has been able to exhibit 2 min in recent past- working on consistency. 12/29/2022= 2 min 40 sec with patient demonstrating improved overall posture and ability to contract glutes and quads with just min assist at trunk today.   Goal status: MET  7. Pt. Will demonstrate improved functional LE strength as seen by ability to stand > 5 min for improved transfer ability and pregait abilities. Baseline: 12/29/2022= 2 min 40 sec with patient demonstrating improved overall posture and ability to contract glutes and quads with just min assist at trunk today.  Goal Status: NEW  8. Patient will perform sit to stand transfer with moderate assist consistently > 75% of time for improved transfer ability and less dependence on caregiver.   Baseline: 12/29/2022- Patient currently max assist level assistance with all sit to stand activities.   Goal status: New   ASSESSMENT:  CLINICAL IMPRESSION:   Patient participated well overall today. She was able to rotate trunk and move arms in coordinated pattern well today to simulate reaching for items. She was able to hold quad contraction with minimal fatigue to assist in strength/stability with standing balance. She was most limited with lifting right LE up/off leg rest requiring some physical assistance today. Pt will continue to benefit from skilled PT services to optimize independence and reduced caregiver support.    OBJECTIVE IMPAIRMENTS Abnormal gait, decreased activity tolerance, decreased balance,  decreased coordination, decreased endurance, decreased mobility, difficulty walking, decreased ROM, decreased strength, hypomobility, impaired flexibility, impaired UE functional use, postural dysfunction, and pain.   ACTIVITY LIMITATIONS carrying, lifting, bending, sitting, standing, squatting, sleeping, stairs, transfers, bed mobility, continence, bathing, toileting, dressing, self feeding, reach over head, hygiene/grooming, and caring for others  PARTICIPATION LIMITATIONS: meal prep, cleaning, laundry, medication management, personal finances, interpersonal relationship, driving, shopping, community activity, and yard work  PERSONAL FACTORS Age, Time since onset of injury/illness/exacerbation, and 1-2 comorbidities: HTN, cervical Sx  are also affecting patient's functional outcome.   REHAB POTENTIAL: Good  CLINICAL DECISION MAKING: Evolving/moderate complexity  EVALUATION COMPLEXITY: Moderate  PLAN: PT FREQUENCY: 1-2x/week  PT DURATION: 12 weeks  PLANNED INTERVENTIONS: Therapeutic exercises, Therapeutic activity, Neuromuscular re-education, Balance training, Gait training, Patient/Family education, Self Care, Joint mobilization, DME instructions, Dry Needling, Electrical stimulation, Wheelchair mobility training, Spinal mobilization, Cryotherapy, Moist heat, and Manual therapy  PLAN FOR NEXT SESSION:  LE and core strength training, Sit to stand and static standing; progress to SPT as able.     3:19 PM, 03/07/23 Louis Meckel, PT Physical Therapist - [facility]  Outpatient Physical Therapy- Main Campus [phone]

## 2023-03-07 NOTE — Therapy (Signed)
Occupational Therapy Neuro Treatment Note        Patient Name: Sherry [name] MRN: [id number] DOB:[birth year], 89 y.o., female Today's Date: 11/29/2022  PCP:  Cindi Carbon PROVIDER: Earnestine Mealing, MD  END OF SESSION:   OT End of Session - 03/07/23 1254     Visit Number [id number]    Number of Visits 168    Date for OT Re-Evaluation 04/13/23    OT Start Time 1100    OT Stop Time 1145    OT Time Calculation (min) 45 min    Equipment Utilized During Treatment powered wheelchair    Activity Tolerance Patient tolerated treatment well    Behavior During Therapy WFL for tasks assessed/performed                 Past Medical History:  Diagnosis Date   Acute blood loss anemia    Arthritis    Cancer (HCC)    skin   Central cord syndrome at C6 level of cervical spinal cord (HCC) 11/29/2017   Hypertension    Protein-calorie malnutrition, severe (HCC) 01/24/2018   S/P insertion of IVC (inferior vena caval) filter 01/24/2018   Tetraparesis (HCC)    Past Surgical History:  Procedure Laterality Date   ANTERIOR CERVICAL DECOMP/DISCECTOMY FUSION N/A 11/29/2017   Procedure: Cervical five-six, six-seven Anterior Cervical Decompression Fusion;  Surgeon: Jadene Pierini, MD;  Location: [facility] OR;  Service: Neurosurgery;  Laterality: N/A;  Cervical five-six, six-seven Anterior Cervical Decompression Fusion   CATARACT EXTRACTION     FEMUR IM NAIL Left 08/16/2021   Procedure: INTRAMEDULLARY (IM) NAIL FEMORAL;  Surgeon: Deeann Saint, MD;  Location: [facility] ORS;  Service: Orthopedics;  Laterality: Left;   IR IVC FILTER PLMT / S&I /IMG GUID/MOD SED  12/08/2017   Patient Active Problem List   Diagnosis Date Noted   Age-related osteoporosis without current pathological fracture 07/27/2022   Mild recurrent major depression (HCC) 07/27/2022   Hypertensive heart disease with other congestive heart failure (HCC) 02/14/2022   Chronic indwelling Foley catheter 02/14/2022   Closed hip fracture (HCC)  08/15/2021   DVT (deep venous thrombosis) (HCC) 08/15/2021   Quadriplegia (HCC) 08/15/2021   Fall 08/15/2021   Constipation due to slow transit 08/31/2018   Trauma 06/05/2018   Neuropathic pain 06/05/2018   Neurogenic bowel 06/05/2018   Vaginal yeast infection 01/30/2018   Healthcare-associated pneumonia 01/25/2018   Chronic allergic rhinitis 01/24/2018   Depression with anxiety 01/24/2018   UTI due to Klebsiella species 01/24/2018   Protein-calorie malnutrition, severe (HCC) 01/24/2018   S/P insertion of IVC (inferior vena caval) filter 01/24/2018   Tetraparesis (HCC) 01/20/2018   Neurogenic bladder 01/20/2018   Reactive depression    Benign essential HTN    Acute postoperative anemia due to expected blood loss    Central cord syndrome at C6 level of cervical spinal cord (HCC) 11/29/2017   Allergy to alpha-gal 11/25/2016   SCC (squamous cell carcinoma) 04/08/2014   ONSET DATE: 11/29/2017  REFERRING DIAG: Central Cord Syndrome at C6 of the Cervical Spinal Cord, Fall with Bilateral Closed Hip Fractures, with ORIF repair with Intramedullary Nailing of the Left Hip Fracture.   THERAPY DIAG:  Muscle weakness (generalized)  Other lack of coordination  Rationale for Evaluation and Treatment: Rehabilitation  SUBJECTIVE:   SUBJECTIVE STATEMENT:  Pt. Reports having had a nice time getting together this past weekend for her family holiday event.  Pt accompanied by: self, Personal Care aide  PERTINENT HISTORY: Central Cord Syndrome at C6 of the  Cervical Spinal Cord, Fall with Bilateral Closed Hip Fractures, with ORIF repair with Intramedullary Nailing of the Left Hip Fracture.   PRECAUTIONS: None  WEIGHT BEARING RESTRICTIONS: No  PAIN:  Are you having pain? No pain, just shoulder stiffness   LIVING ENVIRONMENT: Lives with: [facility]  Stairs: No Has following equipment at home: Wheelchair (power)  PLOF: Independent   PATIENT GOALS:  See below for  established goals  OBJECTIVE:  Note: Objective measures were completed at Evaluation unless otherwise noted.  HAND DOMINANCE: Right  ADLs: Caregiver assist with ADLs, and [facility]  MOBILITY STATUS: Uses a power w/c  ACTIVITY TOLERANCE: Activity tolerance: Fair  FUNCTIONAL OUTCOME MEASURES:  Measurements:   12/09/2021:   Shoulder flexion: R: 120(136), L: 138(150) Shoulder abduction: R: 108(120), L: 110(120) Elbow: R: 0-148, L: 0-146 Wrist flexion: R: 55(62), L: 78 Writ extension: R: 34(55), L: 3(4) RD: R: 12(20), L: 16(26) UD: R: 8(24), L: 6(18) Thumb radial abduction: R: 11(20), L: 8(20) Digit flexion to the [facility]: R:  2nd: 5cm(3.5cm), 3rd: 7.5 cm(5cm), 4th: 7cm(3cm), 5th: 6cm(5.5cm) L: 2nd: 8cm(8cm), 3rd: 8cm(8cm), 4th: 7.5cm(7cm), 5th: 7cm(7cm)   02/03/2022:   Shoulder flexion: R: 122(138), L: 148(154) Shoulder abduction: R: 109(125), L: 125(125 Elbow: R: 0-148, L: 0-146 Wrist flexion: R: 60(68), L: 79 Writ extension: R: 40(55), L: 3(10) RD: R: 14(24), L: 20(28) UD: R: 8(24), L: 6(18) Thumb radial abduction: R: 20(25), L: 8(20) Digit flexion to the [facility]: R:  2nd: 4.5cm(3cm), 3rd: 6.5cm(4.5cm), 4th: 6.5 cm(3cm), 5th: 4.5cm(5cm) L: 2nd: 8cm(8cm), 3rd: 8cm(7cm), 4th: 7.5cm(7cm), 5th: 6.5cm(6.5cm)   04/07/2022:   Shoulder flexion: R: 125(150), L: 160(160) Shoulder abduction: R: 109(125), L: 125(125 Elbow: R: 0-148, L: 0-146 Wrist flexion: R: 60(68), L: 79 Writ extension: R: 40(55), L: 5(18) RD: R: 14(24), L: 20(28) UD: R: 18(24), L: 8(18) Thumb radial abduction: R: 20(25), L: 8(20) Digit flexion to the [facility]: R:  2nd: 4.5 cm(4 cm), 3rd: 6.5cm(3cm), 4th: 7 cm (5cm), 5th: 5cm(5cm) L: 2nd: 8cm(8cm), 3rd: 8cm(7cm), 4th: 7.5cm(7cm), 5th: 7cm(7cm)   07/05/2022:   Shoulder flexion: R: 125(154), L: 160(160) Shoulder abduction: R: 95(130), L: 143(143) Elbow: R: 0-148, L: 0-146 Wrist flexion: R: 60(68), L: 79 Writ extension: R: 40(60), L: 0(12) RD: R: 10(24), L:  12(20) UD: R: 16(24), L: 8(16) Thumb radial abduction: R: 20(25), L: 8(20) Digit flexion to the [facility]: R:  2nd: 4 cm(4cm), 3rd: 6.5cm( 3.5cm), 4th: 7 cm (5cm), 5th: 6cm(5cm) L: 2nd: 8cm(8cm), 3rd: 8cm(7cm), 4th: 8cm(7cm), 5th: 7cm(7cm)   09/13/2022:   Shoulder flexion: R: 125(154), L: 160(160) Shoulder abduction: R: 100(130), L: 143(143) Elbow: R: 0-150, L: 0-150 Wrist flexion: R: 55(68), L: 79 Writ extension: R: 45(60), L: 0(12) RD: R: 10(24), L: 12(20) UD: R: 16(24), L: 8(16) Thumb radial abduction: R: 20(25), L: 8(20) Digit flexion to the [facility]: R:  2nd: 4 cm(4cm), 3rd: 6cm( 5cm), 4th: 6 cm (4cm), 5th: 5cm(5cm) L: 2nd: 7cm(cm), 3rd: 7cm(6cm), 4th: 7cm(6cm), 5th: 6cm(6cm)   10/27/2022   Shoulder flexion: R: 128(154), L: 160(160) Shoulder abduction: R: 105(130), L: 143(143) Elbow: R: 0-150, L: 0-150 Wrist flexion: R: 55(68), L: 79 Writ extension: R: 46(64), L: 0(20) RD: R: 10(20), L: 18(24) UD: R: 118(28), L: 8(20) Thumb radial abduction: R: 30(42), L:20(24) Digit flexion to the [facility]: R:  2nd: 4 cm(3cm), 3rd: 6cm( 6cm), 4th: 6 cm (5cm), 5th: 5cm(5cm) L: 2nd: 8cm(8cm), 3rd: 8cm(6cm), 4th: 8cm(7cm), 5th: 6cm(6cm)  01/19/2023  Shoulder flexion: R: 131(154), L: 160(160) Shoulder  abduction: R: 110(130), L: 143(143) Elbow: R: 0-150, L: 0-150 Wrist flexion: R: 55(68), L: 79 Writ extension: R: 46(64), L: 0(20) RD: R: 10(20), L: 18(24) UD: R: 118(28), L: 14(20) Thumb radial abduction: R: 38(48), L:24(26) Thumb IP flexion: R: 35(50) L: 20(45) Digit flexion to the [facility]: R:  2nd: 3.5 cm(3cm), 3rd: 6cm( 6cm), 4th: 6 cm (5cm), 5th: 5cm(4.5cm) L: 2nd: 8cm(7.5cm), 3rd: 8cm(6cm), 4th: 7.5cm(6cm), 5th: 6cm(4.5cm)  COORDINATION:   01/19/2023:  9 hole peg test:  Right: 2 pegs placed in 1 min. & 5 sec.  Pt. was able to remove 9 pegs in 17 sec.  Left: 2 pegs placed in 1 min. & 10 sec. Pt. was able to remove 9 pegs in 20 sec.  SENSATION: Intact  EDEMA: N/A   COGNITION: Overall  cognitive status: WNL   VISION: Subjective report: Wears glasses, No changes in vision.  PERCEPTION: Intact  TODAY'S TREATMENT:                                                                                                                              DATE:  03/07/23   Manual Therapy:   Pt. tolerated soft tissue massage to the volar, and dorsal surface of each digit on the bilateral hands, digit lateral, and sagittal bands 2/2 to stiffness following moist heat modality. Soft tissue mobilization was performed for carpal, and metacarpal spread stretches. Manual therapy was performed independent of, and in preparation for therapeutic Ex.     Therapeutic Ex.    Pt. tolerated PROM followed by AROM bilateral wrist extension, PROM for bilateral digit MP, PIP, and DIP flexion, and extension, thumb abduction. Bilateral Lateral, and 3pt. Pinch strengthening using yellow, and red level resistive clips.  PATIENT EDUCATION: Education details: Bilateral hand function, ROM, and strengthening Person educated: Patient Education method: Explanation, Demonstration, and Verbal cues Education comprehension: verbalized understanding and returned demonstration  HOME EXERCISE PROGRAM: Continue to assess HEP needs, and provide as needed.   GOALS: Goals reviewed with patient? Yes  LONG TERM GOALS: Target date: 04/13/2023  3.  Patient will demonstrate improved composite finger flexion to be able to firmly hold and use adaptive devices during ADLs, and IADLs.  Baseline: 01/19/2023: Pt. Is improving with Bilateral digit flexion to the [facility]. Pt. has difficulty firmly holding adaptive devices. 12/06/2022: Pt. continues to present with increased MP, PIP, and DIP digit tightness/stiffness limiting the formulation of bilateral composites fists. 10/27/2022: Pt. presents with increased MP, PIP, and DIP digit tightness/stiffness limiting the formulation of bilateral composites fists during this progress reporting period  limiting. Digit flexion to the [employer]: R:  2nd: 4 cm(3cm), 3rd: 6cm( 6cm), 4th: 6 cm (5cm), 5th: 5cm(5cm), L: 2nd: 8cm(8cm), 3rd: 8cm(6cm), 4th: 8cm(7cm), 5th: 6cm(6cm) 09/20/2022: Improving digit flexion to the [facility]. 09/13/22: improved digit flexion to the [facility]. 07/05/2022: Pt. presents with digit MP, PIP, and DIP extensor tightness limiting her ability to securely grip objects in her bilateral hands.  04/07/2022: Pt.  Has improved with right 2nd, and 3rd digit flexion towards the [facility]. Pt. Continues to have difficulty securely holding and applying deodorant.02/03/2022: Pt. Has improved with digit flexion, however, continues to have difficulty securely holding and using deodorant. 12/09/2021: Bilateral hand/digit MP, PIP, and DIP extension tightness limits her ability achieve digit flexion to hold, and apply deodorant. 10/28/2021:  Pt. continues to have difficulty holding the deodorant. 01/12/2021: Pt. Presents with limited digit extension. Pt. Is able to initiate holding deodorant, however is unable to hold it while using it  Goal status: Ongoing   4.  Pt. Will improve bilateral wrist extension in preparation for anticipating, and initiating reaching for objects at the table.  Baseline: 01/19/2023:  R: 46(64) L: 0(20) 12/06/2022: Pt continues to to progress with bilateral wrist extension in preparation fro functional reaching. 10/27/2022: Pt. Is improving with bilateral wrist extension. R: 46(64) L: 0(20) 09/20/2022: limited PROM in the Left wrist extension 2/2 tightness/stiffness. 09/13/22: R: 55(68), L: 0(12) 07/19/2022: Bilateral wrist extension in limited. 07/05/2022: Right: 40(60) Left: 0(12) 04/07/2022: Right: 40(55) Left: 5(18) 02/03/2022: Right: 34(55) Left: 3(4) 12/09/2021: Right  34(55)  Left  3(4) 10/28/2021:  Right 22(38), Left 0(15) 09/14/2021:  Right: 17(35), left 2(15)  Goal status: Limited progress with wrist extension  this progress reporting period. Ongoing  7.  Pt. Will increase bilateral lateral pinch  strength by 2 lbs to be able to securely grasp items during ADLs, and IADL tasks.  Baseline: 01/19/2023: Deferred as the Pinch meter is out for calibration.12/06/2022: Pt. Is able to more securely hold objects during ADLs/IADLs 10/27/2022: Pt. Is improving with holding items with her bilateral thumbs. (Pinch meter out for calibration) 09/20/2022: lateral pinch continues to be limited 09/13/22: R 6.5# L 4# 07/19/2022: TBD 07/05/2022: TBD 07/2022: NT-Pinch meter out for calibration. 02/03/2022: Right: 6#, Left: 4# 12/09/2021: Right: 6#, Left: 4#  Goal status: Deferred   9.  Pt. will complete plant care with modified independence.  Baseline:12/06/2022: Pt. Is independent with plant care in her husband's room, however is not able to access her plants due to furniture blocking access to them. 10/27/2022: Pt. Is able to water plants that are closer, and within reach. Pt. continues to have difficulty reaching for thorough plant care. 09/20/2022: Pt. Continues to have difficulty reaching for thorough plant care. 09/13/22: continues to report intermittent difficulty 07/19/2022: Pt. Continues to be able to water, and care for some of her plants. Pt. Has more difficulty with plants that are harder to reach. 07/05/2022: pt. Is able to water, and care for some of her plants. Pt. Has more difficulty with plants that are harder to reach. 04/07/2022: Pt. is now able to hold a cup and water her plants.02/03/2022: Pt. has difficulty caring for her plants.  Goal status:Achieved   10.  Pt. will demonstrate adaptive techniques to assist with the efficiency of self-dressing, or morning care tasks.  Baseline: 01/19/2203: Pt. Is able to donn her jacket in reverse independently. Pt. requires assist from staff, as Pt. Reports staff have decreased time. 12/06/2022: Pt. requires assist from staff, as Pt. Reports staff have decreased time. 10/27/2022: Pt. is able to assist with initiating UE dressing. Pt. requires assist from personal, and staff  care aides. 09/20/2022: Pt. continues to require assist form personal, and staff care aides. 09/13/22: MODA dressing, reports she is often rushed 07/19/2022: Pt. continues to require assist with self-dressing/morning care tasks. 07/05/2022: Continue with goal. 04/07/2022: Pt. Continues to require assist with the efficiency of self-dressing, and morning care  tasks.02/03/2022: Pt. requires assist from caregivers 2/2 time limitations during morning care.  Goal status: Ongoing  11.  Pt. will improve BUE strength to improve ADL, and IADL functioning.  Baseline: 01/19/2023: L 4/5, R 3+/5 12/06/2022: Continue 10/27/2022: shoulder flexion L 4/5, R 3+/5; shoulder abduction L 4+/5, R 3+/5. elbow flexion B 5/5, elbow extension 4/5 09/20/2022: Pt. Presents with limited BUE strength 09/13/22: shoulder flexion L 4/5, R 3/5; elbow flexion B 5/5, elbow extension 4-/5; shoulder abduction L 4+/5, R 3+/5. 07/19/2022: BUE strength continues to be limited. 07/05/2022: shoulder flexion: right 4-/5, abduction: 3+/5, elbow flexion: right: 5/5, left 5/5, extension: right: 4-/5, left 4-/5, wrist extension: right: 3-/5, left: 2-/5  Goal status: Ongoing  12.  Pt. will improve bilateral thumb radial abduction in order to be able to hold the grab bars while standing with PT  Baseline: 01/19/2023: R: 38(48) L: 24(26)12/06/2022: Pt. Continues to present  with limited thumb abduction, however is improving holding onto the parallel bars.10/27/2022: thumb radial abduction: R: 30(42), L: 20(24  Goal status: Ongoing  13.  Pt. Will be able to securely hold, and stabilize medication bottles at a tabletop surface when opening, and closing them.  Baseline: 01/19/2023: Pt. Is now able to securely hold medication bottles while opening them, and stabilizes them on surfaces while opening them. 12/06/2022: Pt is improving with securely stabilizing medication bottles. 10/27/2022: Pt. Stabilizes bottles against her torso when attempting to open, and close them    Goal status: Achieved  14: Pt. Will improve bilateral thumb IP flexion to improve active grasping patterns.    Baseline: 01/19/2023: Right 35(50), Left: 20/45)    Goal status: New    15. Pt. Will improve bilateral FMC/speed, and dexterity. As evidence by improved scores on the 9 hole peg test.   Baseline: 01/19/2023: 2 pegs placed in 1 min. & 5 sec.  Pt. was able to remove 9 pegs in 17 sec.; Left: 2 pegs placed in 1 min. & 10    sec. Pt. was able to remove 9 pegs in 20 sec.   Goal status: New      ASSESSMENT:  CLINICAL IMPRESSION:  Pt. tolerated the session well, and has no reports of pain. Pt. tolerated UE there. Ex, and manual therapy well, without reports of pain or discomfort. Pt. required cues, and assist to formulate lateral, and 3pt. pinch position. Pt. continues to present with tightness at the bilateral thumb webspace, bilateral wrist flexor tightness, as well as digit MP, PIP, and DIP extensor tightness which continues to limit her ability to complete ADL tasks efficiently. Pt. continues to benefit from working on impoving ROM, and UE strength in order to work towards increasing bilateral hand grasp on objects, and increasing engagement of bilateral hands during ADLs, and IADL tasks.      PERFORMANCE DEFICITS: in functional skills including ADLs, IADLs, coordination, dexterity, ROM, strength, and UE functional use, cognitive skills including , and psychosocial skills including coping strategies and environmental adaptation.   IMPAIRMENTS: are limiting patient from ADLs, IADLs, and leisure.   CO-MORBIDITIES: may have co-morbidities  that affects occupational performance. Patient will benefit from skilled OT to address above impairments and improve overall function.  MODIFICATION OR ASSISTANCE TO COMPLETE EVALUATION: Min-Moderate modification of tasks or assist with assess necessary to complete an evaluation.  OT OCCUPATIONAL PROFILE AND HISTORY: Detailed assessment: Review of  records and additional review of physical, cognitive, psychosocial history related to current functional performance.  CLINICAL DECISION MAKING: Moderate - several treatment options,  min-mod task modification necessary  REHAB POTENTIAL: Good for stated goals  PLAN:  OT FREQUENCY 2x's a week  OT DURATION: 12 weeks  PLANNED INTERVENTIONS ADL training, A/E training, UE ther. Ex, Manual therapy, neuromuscular re-education, moist heat modality, Paraffin Bath, Splinting, and  Pt./caregiver education   RECOMMENDED OTHER SERVICES: PT  CONSULTED AND AGREED WITH PLAN OF CARE: Patient  PLAN FOR NEXT SESSION: Continue Treatment as per established POC  Olegario Messier, MS, OTR/L  03/07/2023

## 2023-03-09 ENCOUNTER — Ambulatory Visit: Payer: Medicare HMO

## 2023-03-09 ENCOUNTER — Ambulatory Visit: Payer: Medicare HMO | Admitting: Occupational Therapy

## 2023-03-09 ENCOUNTER — Non-Acute Institutional Stay (SKILLED_NURSING_FACILITY): Payer: Medicare HMO | Admitting: Student

## 2023-03-09 ENCOUNTER — Encounter: Payer: Self-pay | Admitting: Student

## 2023-03-09 DIAGNOSIS — R278 Other lack of coordination: Secondary | ICD-10-CM

## 2023-03-09 DIAGNOSIS — K5901 Slow transit constipation: Secondary | ICD-10-CM

## 2023-03-09 DIAGNOSIS — Z86718 Personal history of other venous thrombosis and embolism: Secondary | ICD-10-CM | POA: Diagnosis not present

## 2023-03-09 DIAGNOSIS — F330 Major depressive disorder, recurrent, mild: Secondary | ICD-10-CM | POA: Diagnosis not present

## 2023-03-09 DIAGNOSIS — I5089 Other heart failure: Secondary | ICD-10-CM

## 2023-03-09 DIAGNOSIS — M6281 Muscle weakness (generalized): Secondary | ICD-10-CM | POA: Diagnosis not present

## 2023-03-09 DIAGNOSIS — G8250 Quadriplegia, unspecified: Secondary | ICD-10-CM | POA: Diagnosis not present

## 2023-03-09 DIAGNOSIS — I110 Hypertensive heart disease with heart failure: Secondary | ICD-10-CM | POA: Diagnosis not present

## 2023-03-09 NOTE — Therapy (Signed)
 Occupational Therapy Neuro Treatment Note        Patient Name: SHALECE STAFFA MRN: [id number] DOB:[birth year], 89 y.o., female Today's Date: 11/29/2022  PCP:  [facility] PROVIDER: Abdul Fine, MD  END OF SESSION:   OT End of Session - 03/09/23 1546     Visit Number [id number]    Number of Visits 168    Date for OT Re-Evaluation 04/13/23    OT Start Time 1103    OT Stop Time 1145    OT Time Calculation (min) 42 min    Activity Tolerance Patient tolerated treatment well    Behavior During Therapy WFL for tasks assessed/performed                 Past Medical History:  Diagnosis Date   Acute blood loss anemia    Arthritis    Cancer (HCC)    skin   Central cord syndrome at C6 level of cervical spinal cord (HCC) 11/29/2017   Hypertension    Protein-calorie malnutrition, severe (HCC) 01/24/2018   S/P insertion of IVC (inferior vena caval) filter 01/24/2018   Tetraparesis (HCC)    Past Surgical History:  Procedure Laterality Date   ANTERIOR CERVICAL DECOMP/DISCECTOMY FUSION N/A 11/29/2017   Procedure: Cervical five-six, six-seven Anterior Cervical Decompression Fusion;  Surgeon: Cheryle Debby DELENA, MD;  Location: [facility] OR;  Service: Neurosurgery;  Laterality: N/A;  Cervical five-six, six-seven Anterior Cervical Decompression Fusion   CATARACT EXTRACTION     FEMUR IM NAIL Left 08/16/2021   Procedure: INTRAMEDULLARY (IM) NAIL FEMORAL;  Surgeon: Cleotilde Barrio, MD;  Location: [facility] ORS;  Service: Orthopedics;  Laterality: Left;   IR IVC FILTER PLMT / S&I /IMG GUID/MOD SED  12/08/2017   Patient Active Problem List   Diagnosis Date Noted   Age-related osteoporosis without current pathological fracture 07/27/2022   Mild recurrent major depression (HCC) 07/27/2022   Hypertensive heart disease with other congestive heart failure (HCC) 02/14/2022   Chronic indwelling Foley catheter 02/14/2022   Closed hip fracture (HCC) 08/15/2021   DVT (deep venous thrombosis) (HCC)  08/15/2021   Quadriplegia (HCC) 08/15/2021   Fall 08/15/2021   Constipation due to slow transit 08/31/2018   Trauma 06/05/2018   Neuropathic pain 06/05/2018   Neurogenic bowel 06/05/2018   Vaginal yeast infection 01/30/2018   Healthcare-associated pneumonia 01/25/2018   Chronic allergic rhinitis 01/24/2018   Depression with anxiety 01/24/2018   UTI due to Klebsiella species 01/24/2018   Protein-calorie malnutrition, severe (HCC) 01/24/2018   S/P insertion of IVC (inferior vena caval) filter 01/24/2018   Tetraparesis (HCC) 01/20/2018   Neurogenic bladder 01/20/2018   Reactive depression    Benign essential HTN    Acute postoperative anemia due to expected blood loss    Central cord syndrome at C6 level of cervical spinal cord (HCC) 11/29/2017   Allergy to alpha-gal 11/25/2016   SCC (squamous cell carcinoma) 04/08/2014   ONSET DATE: 11/29/2017  REFERRING DIAG: Central Cord Syndrome at C6 of the Cervical Spinal Cord, Fall with Bilateral Closed Hip Fractures, with ORIF repair with Intramedullary Nailing of the Left Hip Fracture.   THERAPY DIAG:  Muscle weakness (generalized)  Other lack of coordination  Rationale for Evaluation and Treatment: Rehabilitation  SUBJECTIVE:   SUBJECTIVE STATEMENT:  Pt.  reports  that she needs to order a new foot brace.  Pt accompanied by: self, Personal Care aide  PERTINENT HISTORY: Central Cord Syndrome at C6 of the Cervical Spinal Cord, Fall with Bilateral Closed Hip Fractures, with ORIF repair with  Intramedullary Nailing of the Left Hip Fracture.   PRECAUTIONS: None  WEIGHT BEARING RESTRICTIONS: No  PAIN:  Are you having pain? No pain, just shoulder stiffness   LIVING ENVIRONMENT: Lives with: [facility]  Stairs: No Has following equipment at home: Wheelchair (power)  PLOF: Independent   PATIENT GOALS:  See below for established goals  OBJECTIVE:  Note: Objective measures were completed at Evaluation unless  otherwise noted.  HAND DOMINANCE: Right  ADLs: Caregiver assist with ADLs, and [facility]  MOBILITY STATUS: Uses a power w/c  ACTIVITY TOLERANCE: Activity tolerance: Fair  FUNCTIONAL OUTCOME MEASURES:  Measurements:   12/09/2021:   Shoulder flexion: R: 120(136), L: 138(150) Shoulder abduction: R: 108(120), L: 110(120) Elbow: R: 0-148, L: 0-146 Wrist flexion: R: 55(62), L: 78 Writ extension: R: 34(55), L: 3(4) RD: R: 12(20), L: 16(26) UD: R: 8(24), L: 6(18) Thumb radial abduction: R: 11(20), L: 8(20) Digit flexion to the [facility]: R:  2nd: 5cm(3.5cm), 3rd: 7.5 cm(5cm), 4th: 7cm(3cm), 5th: 6cm(5.5cm) L: 2nd: 8cm(8cm), 3rd: 8cm(8cm), 4th: 7.5cm(7cm), 5th: 7cm(7cm)   02/03/2022:   Shoulder flexion: R: 122(138), L: 148(154) Shoulder abduction: R: 109(125), L: 125(125 Elbow: R: 0-148, L: 0-146 Wrist flexion: R: 60(68), L: 79 Writ extension: R: 40(55), L: 3(10) RD: R: 14(24), L: 20(28) UD: R: 8(24), L: 6(18) Thumb radial abduction: R: 20(25), L: 8(20) Digit flexion to the [facility]: R:  2nd: 4.5cm(3cm), 3rd: 6.5cm(4.5cm), 4th: 6.5 cm(3cm), 5th: 4.5cm(5cm) L: 2nd: 8cm(8cm), 3rd: 8cm(7cm), 4th: 7.5cm(7cm), 5th: 6.5cm(6.5cm)   04/07/2022:   Shoulder flexion: R: 125(150), L: 160(160) Shoulder abduction: R: 109(125), L: 125(125 Elbow: R: 0-148, L: 0-146 Wrist flexion: R: 60(68), L: 79 Writ extension: R: 40(55), L: 5(18) RD: R: 14(24), L: 20(28) UD: R: 18(24), L: 8(18) Thumb radial abduction: R: 20(25), L: 8(20) Digit flexion to the [facility]: R:  2nd: 4.5 cm(4 cm), 3rd: 6.5cm(3cm), 4th: 7 cm (5cm), 5th: 5cm(5cm) L: 2nd: 8cm(8cm), 3rd: 8cm(7cm), 4th: 7.5cm(7cm), 5th: 7cm(7cm)   07/05/2022:   Shoulder flexion: R: 125(154), L: 160(160) Shoulder abduction: R: 95(130), L: 143(143) Elbow: R: 0-148, L: 0-146 Wrist flexion: R: 60(68), L: 79 Writ extension: R: 40(60), L: 0(12) RD: R: 10(24), L: 12(20) UD: R: 16(24), L: 8(16) Thumb radial abduction: R: 20(25), L: 8(20) Digit flexion to  the [facility]: R:  2nd: 4 cm(4cm), 3rd: 6.5cm( 3.5cm), 4th: 7 cm (5cm), 5th: 6cm(5cm) L: 2nd: 8cm(8cm), 3rd: 8cm(7cm), 4th: 8cm(7cm), 5th: 7cm(7cm)   09/13/2022:   Shoulder flexion: R: 125(154), L: 160(160) Shoulder abduction: R: 100(130), L: 143(143) Elbow: R: 0-150, L: 0-150 Wrist flexion: R: 55(68), L: 79 Writ extension: R: 45(60), L: 0(12) RD: R: 10(24), L: 12(20) UD: R: 16(24), L: 8(16) Thumb radial abduction: R: 20(25), L: 8(20) Digit flexion to the [facility]: R:  2nd: 4 cm(4cm), 3rd: 6cm( 5cm), 4th: 6 cm (4cm), 5th: 5cm(5cm) L: 2nd: 7cm(cm), 3rd: 7cm(6cm), 4th: 7cm(6cm), 5th: 6cm(6cm)   10/27/2022   Shoulder flexion: R: 128(154), L: 160(160) Shoulder abduction: R: 105(130), L: 143(143) Elbow: R: 0-150, L: 0-150 Wrist flexion: R: 55(68), L: 79 Writ extension: R: 46(64), L: 0(20) RD: R: 10(20), L: 18(24) UD: R: 118(28), L: 8(20) Thumb radial abduction: R: 30(42), L:20(24) Digit flexion to the [facility]: R:  2nd: 4 cm(3cm), 3rd: 6cm( 6cm), 4th: 6 cm (5cm), 5th: 5cm(5cm) L: 2nd: 8cm(8cm), 3rd: 8cm(6cm), 4th: 8cm(7cm), 5th: 6cm(6cm)  01/19/2023  Shoulder flexion: R: 131(154), L: 160(160) Shoulder abduction: R: 110(130), L: 143(143) Elbow: R: 0-150, L: 0-150 Wrist flexion: R:  55(68), L: 79 Writ extension: R: 46(64), L: 0(20) RD: R: 10(20), L: 18(24) UD: R: 118(28), L: 14(20) Thumb radial abduction: R: 38(48), L:24(26) Thumb IP flexion: R: 35(50) L: 20(45) Digit flexion to the [facility]: R:  2nd: 3.5 cm(3cm), 3rd: 6cm( 6cm), 4th: 6 cm (5cm), 5th: 5cm(4.5cm) L: 2nd: 8cm(7.5cm), 3rd: 8cm(6cm), 4th: 7.5cm(6cm), 5th: 6cm(4.5cm)  COORDINATION:   01/19/2023:  9 hole peg test:  Right: 2 pegs placed in 1 min. & 5 sec.  Pt. was able to remove 9 pegs in 17 sec.  Left: 2 pegs placed in 1 min. & 10 sec. Pt. was able to remove 9 pegs in 20 sec.  SENSATION: Intact  EDEMA: N/A   COGNITION: Overall cognitive status: WNL   VISION: Subjective report: Wears glasses, No changes in  vision.  PERCEPTION: Intact  TODAY'S TREATMENT:                                                                                                                              DATE:  03/07/23   Manual Therapy:   Pt. tolerated soft tissue massage to the volar, and dorsal surface of each digit on the bilateral hands, digit lateral, and sagittal bands 2/2 to stiffness following moist heat modality. Soft tissue mobilization was performed for carpal, and metacarpal spread stretches. Manual therapy was performed independent of, and in preparation for therapeutic Ex.     Therapeutic Ex.    Pt. tolerated PROM followed by AROM bilateral wrist extension, PROM for bilateral digit MP, PIP, and DIP flexion, and extension, thumb abduction. Pt. worked on [employer], and reciprocal motion using the UBE while seated for 5 min. with no resistance.   Therapeutic Ex:  Facilitated lateral grasp patterns removing 1 resistive cubes, and replacing them using isolated 2nd digit extension to press them into place. The task was placed at an elevated vertical angle to increase UE challenge, and promote wrist extension.      PATIENT EDUCATION: Education details: Bilateral hand function, ROM, and strengthening Person educated: Patient Education method: Explanation, Demonstration, and Verbal cues Education comprehension: verbalized understanding and returned demonstration  HOME EXERCISE PROGRAM: Continue to assess HEP needs, and provide as needed.   GOALS: Goals reviewed with patient? Yes  LONG TERM GOALS: Target date: 04/13/2023  3.  Patient will demonstrate improved composite finger flexion to be able to firmly hold and use adaptive devices during ADLs, and IADLs.  Baseline: 01/19/2023: Pt. Is improving with Bilateral digit flexion to the [facility]. Pt. has difficulty firmly holding adaptive devices. 12/06/2022: Pt. continues to present with increased MP, PIP, and DIP digit tightness/stiffness limiting the  formulation of bilateral composites fists. 10/27/2022: Pt. presents with increased MP, PIP, and DIP digit tightness/stiffness limiting the formulation of bilateral composites fists during this progress reporting period limiting. Digit flexion to the [facility]: R:  2nd: 4 cm(3cm), 3rd: 6cm( 6cm), 4th: 6 cm (5cm), 5th: 5cm(5cm), L: 2nd: 8cm(8cm), 3rd: 8cm(6cm), 4th: 8cm(7cm), 5th:  6cm(6cm) 09/20/2022: Improving digit flexion to the [facility]. 09/13/22: improved digit flexion to the [facility]. 07/05/2022: Pt. presents with digit MP, PIP, and DIP extensor tightness limiting her ability to securely grip objects in her bilateral hands.  04/07/2022: Pt. Has improved with right 2nd, and 3rd digit flexion towards the [facility]. Pt. Continues to have difficulty securely holding and applying deodorant.02/03/2022: Pt. Has improved with digit flexion, however, continues to have difficulty securely holding and using deodorant. 12/09/2021: Bilateral hand/digit MP, PIP, and DIP extension tightness limits her ability achieve digit flexion to hold, and apply deodorant. 10/28/2021:  Pt. continues to have difficulty holding the deodorant. 01/12/2021: Pt. Presents with limited digit extension. Pt. Is able to initiate holding deodorant, however is unable to hold it while using it  Goal status: Ongoing   4.  Pt. Will improve bilateral wrist extension in preparation for anticipating, and initiating reaching for objects at the table.  Baseline: 01/19/2023:  R: 46(64) L: 0(20) 12/06/2022: Pt continues to to progress with bilateral wrist extension in preparation fro functional reaching. 10/27/2022: Pt. Is improving with bilateral wrist extension. R: 46(64) L: 0(20) 09/20/2022: limited PROM in the Left wrist extension 2/2 tightness/stiffness. 09/13/22: R: 55(68), L: 0(12) 07/19/2022: Bilateral wrist extension in limited. 07/05/2022: Right: 40(60) Left: 0(12) 04/07/2022: Right: 40(55) Left: 5(18) 02/03/2022: Right: 34(55) Left: 3(4) 12/09/2021: Right  34(55)  Left  3(4)  10/28/2021:  Right 22(38), Left 0(15) 09/14/2021:  Right: 17(35), left 2(15)  Goal status: Limited progress with wrist extension  this progress reporting period. Ongoing  7.  Pt. Will increase bilateral lateral pinch strength by 2 lbs to be able to securely grasp items during ADLs, and IADL tasks.  Baseline: 01/19/2023: Deferred as the Pinch meter is out for calibration.12/06/2022: Pt. Is able to more securely hold objects during ADLs/IADLs 10/27/2022: Pt. Is improving with holding items with her bilateral thumbs. (Pinch meter out for calibration) 09/20/2022: lateral pinch continues to be limited 09/13/22: R 6.5# L 4# 07/19/2022: TBD 07/05/2022: TBD 07/2022: NT-Pinch meter out for calibration. 02/03/2022: Right: 6#, Left: 4# 12/09/2021: Right: 6#, Left: 4#  Goal status: Deferred   9.  Pt. will complete plant care with modified independence.  Baseline:12/06/2022: Pt. Is independent with plant care in her husband's room, however is not able to access her plants due to furniture blocking access to them. 10/27/2022: Pt. Is able to water plants that are closer, and within reach. Pt. continues to have difficulty reaching for thorough plant care. 09/20/2022: Pt. Continues to have difficulty reaching for thorough plant care. 09/13/22: continues to report intermittent difficulty 07/19/2022: Pt. Continues to be able to water, and care for some of her plants. Pt. Has more difficulty with plants that are harder to reach. 07/05/2022: pt. Is able to water, and care for some of her plants. Pt. Has more difficulty with plants that are harder to reach. 04/07/2022: Pt. is now able to hold a cup and water her plants.02/03/2022: Pt. has difficulty caring for her plants.  Goal status:Achieved   10.  Pt. will demonstrate adaptive techniques to assist with the efficiency of self-dressing, or morning care tasks.  Baseline: 01/19/2203: Pt. Is able to donn her jacket in reverse independently. Pt. requires assist from staff, as Pt. Reports staff  have decreased time. 12/06/2022: Pt. requires assist from staff, as Pt. Reports staff have decreased time. 10/27/2022: Pt. is able to assist with initiating UE dressing. Pt. requires assist from personal, and staff care aides. 09/20/2022: Pt. continues to require assist form personal,  and staff care aides. 09/13/22: MODA dressing, reports she is often rushed 07/19/2022: Pt. continues to require assist with self-dressing/morning care tasks. 07/05/2022: Continue with goal. 04/07/2022: Pt. Continues to require assist with the efficiency of self-dressing, and morning care tasks.02/03/2022: Pt. requires assist from caregivers 2/2 time limitations during morning care.  Goal status: Ongoing  11.  Pt. will improve BUE strength to improve ADL, and IADL functioning.  Baseline: 01/19/2023: L 4/5, R 3+/5 12/06/2022: Continue 10/27/2022: shoulder flexion L 4/5, R 3+/5; shoulder abduction L 4+/5, R 3+/5. elbow flexion B 5/5, elbow extension 4/5 09/20/2022: Pt. Presents with limited BUE strength 09/13/22: shoulder flexion L 4/5, R 3/5; elbow flexion B 5/5, elbow extension 4-/5; shoulder abduction L 4+/5, R 3+/5. 07/19/2022: BUE strength continues to be limited. 07/05/2022: shoulder flexion: right 4-/5, abduction: 3+/5, elbow flexion: right: 5/5, left 5/5, extension: right: 4-/5, left 4-/5, wrist extension: right: 3-/5, left: 2-/5  Goal status: Ongoing  12.  Pt. will improve bilateral thumb radial abduction in order to be able to hold the grab bars while standing with PT  Baseline: 01/19/2023: R: 38(48) L: 24(26)12/06/2022: Pt. Continues to present  with limited thumb abduction, however is improving holding onto the parallel bars.10/27/2022: thumb radial abduction: R: 30(42), L: 20(24  Goal status: Ongoing  13.  Pt. Will be able to securely hold, and stabilize medication bottles at a tabletop surface when opening, and closing them.  Baseline: 01/19/2023: Pt. Is now able to securely hold medication bottles while opening them, and  stabilizes them on surfaces while opening them. 12/06/2022: Pt is improving with securely stabilizing medication bottles. 10/27/2022: Pt. Stabilizes bottles against her torso when attempting to open, and close them   Goal status: Achieved  14: Pt. Will improve bilateral thumb IP flexion to improve active grasping patterns.    Baseline: 01/19/2023: Right 35(50), Left: 20/45)    Goal status: New    15. Pt. Will improve bilateral FMC/speed, and dexterity. As evidence by improved scores on the 9 hole peg test.   Baseline: 01/19/2023: 2 pegs placed in 1 min. & 5 sec.  Pt. was able to remove 9 pegs in 17 sec.; Left: 2 pegs placed in 1 min. & 10    sec. Pt. was able to remove 9 pegs in 20 sec.   Goal status: New      ASSESSMENT:  CLINICAL IMPRESSION:  Pt. reports needing to order a new foot brace. Pt. utilized more of a lateral pinch grasp to remove 100% of the resistive cubes. Pt. required cues, and assist to formulate 2nd digit extension to press them back into place. Pt. Presents with improving extended reach, with improved digit extension to release the cubes. Pt. continues to benefit form OT services to work on impoving ROM, and UE strength in order to work towards increasing bilateral hand grasp on objects, and increasing engagement of bilateral hands during ADLs, and IADL tasks.      PERFORMANCE DEFICITS: in functional skills including ADLs, IADLs, coordination, dexterity, ROM, strength, and UE functional use, cognitive skills including , and psychosocial skills including coping strategies and environmental adaptation.   IMPAIRMENTS: are limiting patient from ADLs, IADLs, and leisure.   CO-MORBIDITIES: may have co-morbidities  that affects occupational performance. Patient will benefit from skilled OT to address above impairments and improve overall function.  MODIFICATION OR ASSISTANCE TO COMPLETE EVALUATION: Min-Moderate modification of tasks or assist with assess necessary to complete  an evaluation.  OT OCCUPATIONAL PROFILE AND HISTORY: Detailed assessment: Review of  records and additional review of physical, cognitive, psychosocial history related to current functional performance.  CLINICAL DECISION MAKING: Moderate - several treatment options, min-mod task modification necessary  REHAB POTENTIAL: Good for stated goals  PLAN:  OT FREQUENCY 2x's a week  OT DURATION: 12 weeks  PLANNED INTERVENTIONS ADL training, A/E training, UE ther. Ex, Manual therapy, neuromuscular re-education, moist heat modality, Paraffin Bath, Splinting, and  Pt./caregiver education   RECOMMENDED OTHER SERVICES: PT  CONSULTED AND AGREED WITH PLAN OF CARE: Patient  PLAN FOR NEXT SESSION: Continue Treatment as per established POC  Richardson Otter, MS, OTR/L  03/07/2023

## 2023-03-09 NOTE — Progress Notes (Addendum)
 Location:  Other [place].  Nursing Home Room Number: [facility] [address] Place of Service:  SNF [phone]) Provider:  Abdul Fine, MD  Patient Care Team: Abdul Fine, MD as PCP - General (Family Medicine)  Extended Emergency Contact Information Primary Emergency Contact: [name],[name] Mobile Phone: [phone] Relation: Daughter Secondary Emergency Contact: Mathias Lynwood HERO Address: [address]          [place], [place] [address] [place] Home Phone: [phone] Mobile Phone: [phone] Relation: Spouse  Code Status:  DNR Goals of care: Advanced Directive information    03/09/2023   12:39 PM  Advanced Directives  Does Patient Have a Medical Advance Directive? Yes  Type of Estate Agent of [place];Out of facility DNR (pink MOST or yellow form);Living will  Does patient want to make changes to medical advance directive? No - Patient declined  Copy of Healthcare Power of Attorney in Chart? Yes - validated most recent copy scanned in chart (See row information)     Chief Complaint  Patient presents with   Medical Management of Chronic Issues    Medical Management of Chronic Issues.     HPI:  Pt is a 89 y.o. female seen today for medical management of chronic diseases.   Patient is doing well. Having normal bowel movements and no abdominal pain. Concern for purple colored toes. Found out this was due to her foot brace. Not due to leg compression. She is on her way to physical therapy.   Nursing without other concerns at this time.   Past Medical History:  Diagnosis Date   Acute blood loss anemia    Arthritis    Cancer (HCC)    skin   Central cord syndrome at C6 level of cervical spinal cord (HCC) 11/29/2017   Hypertension    Protein-calorie malnutrition, severe (HCC) 01/24/2018   S/P insertion of IVC (inferior vena caval) filter 01/24/2018   Tetraparesis (HCC)    Past Surgical History:  Procedure Laterality Date    ANTERIOR CERVICAL DECOMP/DISCECTOMY FUSION N/A 11/29/2017   Procedure: Cervical five-six, six-seven Anterior Cervical Decompression Fusion;  Surgeon: Cheryle Debby LABOR, MD;  Location: [facility] OR;  Service: Neurosurgery;  Laterality: N/A;  Cervical five-six, six-seven Anterior Cervical Decompression Fusion   CATARACT EXTRACTION     FEMUR IM NAIL Left 08/16/2021   Procedure: INTRAMEDULLARY (IM) NAIL FEMORAL;  Surgeon: Cleotilde Barrio, MD;  Location: [facility] ORS;  Service: Orthopedics;  Laterality: Left;   IR IVC FILTER PLMT / S&I /IMG GUID/MOD SED  12/08/2017    Allergies  Allergen Reactions   Other Swelling   Lisinopril-Hydrochlorothiazide Swelling    Swelling of the tongue   Beef-Derived Drug Products Swelling    Facial and lip swelling (due to tick bite)   Dairy Aid [Tilactase] Swelling   Lisinopril-Hydrochlorothiazide Swelling    Tongue swelling   Pork-Derived Products Swelling    Outpatient Encounter Medications as of 03/09/2023  Medication Sig   acetaminophen  (TYLENOL ) 500 MG tablet Take 500 mg by mouth every 12 (twelve) hours as needed for mild pain.   acetaminophen  (TYLENOL ) 650 MG CR tablet Take 650 mg by mouth every 8 (eight) hours as needed for pain.   alendronate (FOSAMAX) 70 MG tablet Take 70 mg by mouth every Saturday.   calcium  carbonate (CALCIUM  600) 600 MG TABS tablet Take 600 mg by mouth at bedtime.   Capsaicin  0.1 % CREA Apply 1 application  topically every evening. (Apply to hands)   cetirizine (ZYRTEC) 5 MG tablet  Take 5 mg by mouth 2 (two) times daily.   cholecalciferol  (VITAMIN D3) 25 MCG (1000 UNIT) tablet Take 1,000 Units by mouth daily.   DULoxetine  (CYMBALTA ) 60 MG capsule Take 60 mg by mouth daily.   ferrous sulfate  325 (65 FE) MG tablet Take 325 mg by mouth daily with breakfast.   fluocinonide (LIDEX) 0.05 % external solution Apply 1 Application topically every 12 (twelve) hours as needed.   gabapentin  (NEURONTIN ) 100 MG capsule Take 1 capsule (100 mg total) by mouth  daily.   hydroxypropyl methylcellulose / hypromellose (ISOPTO TEARS / GONIOVISC) 2.5 % ophthalmic solution Place 1 drop into both eyes every hour as needed for dry eyes.   Infant Care Products [facility]) OINT Apply to buttocks/gluteal folds topically every shift.   lactulose , encephalopathy, (CHRONULAC ) 10 GM/15ML SOLN Take 30 mLs by mouth daily as needed (mild constipation).   magnesium  citrate solution Take 60-120 mLs by mouth daily as needed (constipation).   methylcellulose oral powder Take 1 packet by mouth at bedtime.   mometasone  (ELOCON ) 0.1 % lotion Apply 1 Application topically every Saturday at 6 PM. (Apply to the ear canal for eczema)   Multiple Vitamin (MULTIVITAMIN WITH MINERALS) TABS tablet Take 1 tablet by mouth daily.   olopatadine (PATADAY) 0.1 % ophthalmic solution Place 1 drop into both eyes 2 (two) times daily.   polyethylene glycol (MIRALAX  / GLYCOLAX ) 17 g packet Take 17 g by mouth 2 (two) times daily.   rivaroxaban  (XARELTO ) 10 MG TABS tablet Take 10 mg by mouth daily.   saccharomyces boulardii (FLORASTOR) 250 MG capsule Take 250 mg by mouth daily.   Saline Spray 0.2 % SOLN Place 2 sprays into both nostrils as needed (nasal congestion).   simethicone  (MYLICON) 80 MG chewable tablet Chew 80 mg by mouth every 6 (six) hours as needed for flatulence.   sodium phosphate  (FLEET) 7-19 GM/118ML ENEM Place 1 enema rectally every other day as needed for severe constipation.   spironolactone  (ALDACTONE ) 25 MG tablet Take 0.5 tablets (12.5 mg total) by mouth daily.   tiZANidine  (ZANAFLEX ) 2 MG tablet Take 1 tablet (2 mg total) by mouth 2 (two) times daily.   triamcinolone  cream (KENALOG ) 0.1 % Apply to thigh and lower legs topically every 8 hours as needed. Apply to back of neck topically as needed.   Zinc Oxide (TRIPLE PASTE) 12.8 % ointment Apply 1 Application topically. Every shift.   acetic acid 0.25 % irrigation Use 30ml via irrigation twice daily to keep foley Cath patent.    [DISCONTINUED] ketoconazole (NIZORAL) 2 % shampoo Apply 1 Application topically 2 (two) times a week. (Patient not taking: Reported on 03/09/2023)   No facility-administered encounter medications on file as of 03/09/2023.    Review of Systems  Immunization History  Administered Date(s) Administered   Influenza, High Dose Seasonal PF 11/17/2021   Influenza-Unspecified 11/14/2018, 11/20/2019, 11/17/2020, 11/17/2021, 11/24/2022   Moderna Covid-19 Fall Seasonal Vaccine 11yrs & older 05/11/2022   Moderna Covid-19 Vaccine Bivalent Booster 26yrs & up 06/30/2021, 12/11/2021, 10/29/2022   Moderna Sars-Covid-2 Vaccination 02/09/2019, 03/09/2019, 12/14/2019, 06/20/2020, 10/24/2020   PPD Test 05/16/2019   Tdap 11/29/2017   Zoster Recombinant(Shingrix) 09/02/2022, 12/04/2022   Pertinent  Health Maintenance Due  Topic Date Due   INFLUENZA VACCINE  Completed   DEXA SCAN  Discontinued      08/19/2021   10:00 AM 08/19/2021    8:54 PM 08/20/2021    8:20 AM 03/29/2022   10:11 AM 08/31/2022    2:42 PM  Fall Risk  Falls in the past year?    0 0  Was there an injury with Fall?    0   Fall Risk Category Calculator    0   (RETIRED) Patient Fall Risk Level High fall risk High fall risk High fall risk    Patient at Risk for Falls Due to    No Fall Risks    Functional Status Survey:    Vitals:   03/09/23 1223  BP: 124/66  Pulse: 66  Resp: 18  Temp: 97.6 F (36.4 C)  SpO2: 95%  Weight: 182 lb 9.6 oz (82.8 kg)  Height: 5' 7 (1.702 m)   Body mass index is 28.6 kg/m. Physical Exam Cardiovascular:     Rate and Rhythm: Normal rate.     Pulses: Normal pulses.  Musculoskeletal:     Comments: Quadriplegia. Right foot in brace. Patient declined further foot exam  Neurological:     Mental Status: She is alert and oriented to person, place, and time.     Labs reviewed: Recent Labs    04/21/22 1200 06/02/22 0000 09/29/22 0000 11/04/22 0000  NA 133* 140 139 139  K 3.8 4.6 3.8 3.8  CL 100 102  100 100  CO2 30 31* 29* 32*  GLUCOSE 108*  --   --   --   BUN 8 13 10 9   CREATININE 0.68 0.5 0.5 0.5  CALCIUM  8.0* 9.0 8.6* 8.8   Recent Labs    09/29/22 0000  AST 20  ALT 14  ALKPHOS 58  ALBUMIN  3.6   Recent Labs    04/21/22 1200 04/21/22 1731 04/26/22 0000 05/03/22 0000 05/10/22 0000 05/17/22 0000 09/30/22 0000  WBC 7.1  --  5.7  --   --   --  4.7  NEUTROABS  --   --  3,283.00  --   --   --  2,627.00  HGB 8.5*   < > 8.3*   < > 10.0* 10.7* 13.2  HCT 26.6*   < > 26*   < > 31* 33* 41  MCV 99.3  --   --   --   --   --   --   PLT 348  --  444*  --   --   --  280   < > = values in this interval not displayed.   Lab Results  Component Value Date   TSH 1.31 09/29/2022   No results found for: HGBA1C No results found for: CHOL, HDL, LDLCALC, LDLDIRECT, TRIG, CHOLHDL  Significant Diagnostic Results in last 30 days:  No results found.  Assessment/Plan History of DVT (deep vein thrombosis)  Tetraparesis (HCC), Chronic - Plan: PT orthosis to lower extremity  Hypertensive heart disease with other congestive heart failure (HCC), Chronic  Mild recurrent major depression (HCC)  Constipation due to slow transit Patient is on anticoagulation due to hx of DVT. Continues physical therapy for tetraparesis. She will need a new brace for the left leg as she appears to lose circulation with the current option. Will order today and fax information to [facility] for the new brace. Spacticity, takes tizanidine  periodically. Mood stable at this time continue current dosing of medications. BP well-controlled at this time. Periodic fluctuations in blood pressure, continue routine BP checks. Constipation well-managed with periodica clean outs continue daily prn medication.   Family/ staff Communication: nursing  Labs/tests ordered:  none

## 2023-03-10 ENCOUNTER — Encounter: Payer: Self-pay | Admitting: Student

## 2023-03-14 ENCOUNTER — Ambulatory Visit: Payer: Medicare HMO

## 2023-03-14 ENCOUNTER — Ambulatory Visit: Payer: Medicare HMO | Admitting: Occupational Therapy

## 2023-03-14 DIAGNOSIS — R2689 Other abnormalities of gait and mobility: Secondary | ICD-10-CM

## 2023-03-14 DIAGNOSIS — R278 Other lack of coordination: Secondary | ICD-10-CM

## 2023-03-14 DIAGNOSIS — M542 Cervicalgia: Secondary | ICD-10-CM

## 2023-03-14 DIAGNOSIS — R269 Unspecified abnormalities of gait and mobility: Secondary | ICD-10-CM

## 2023-03-14 DIAGNOSIS — M6281 Muscle weakness (generalized): Secondary | ICD-10-CM | POA: Diagnosis not present

## 2023-03-14 DIAGNOSIS — R296 Repeated falls: Secondary | ICD-10-CM

## 2023-03-14 DIAGNOSIS — S14129S Central cord syndrome at unspecified level of cervical spinal cord, sequela: Secondary | ICD-10-CM

## 2023-03-14 DIAGNOSIS — R262 Difficulty in walking, not elsewhere classified: Secondary | ICD-10-CM

## 2023-03-14 DIAGNOSIS — R2681 Unsteadiness on feet: Secondary | ICD-10-CM

## 2023-03-14 DIAGNOSIS — S72001A Fracture of unspecified part of neck of right femur, initial encounter for closed fracture: Secondary | ICD-10-CM

## 2023-03-14 NOTE — Therapy (Signed)
OUTPATIENT PHYSICAL THERAPY NEURO TREATMENT  Patient Name: Sherry [name] MRN: [id number] DOB:12-18-1934, 89 y.o., female Today's Date: 03/15/2023   PCP: Earnestine Mealing, MD REFERRING PROVIDER: Deeann Saint   PT End of Session - 03/14/23 1100     Visit Number 97    Number of Visits 112    Date for PT Re-Evaluation 03/23/23    Authorization Type [employer] FOTO performed by PT on eval (7/20), score 12, Progress note on 03/10/2020; PN on 08/20/2020    Authorization Time Period Initial cert 8/46/9629-52/84/1324; Recert 01/11/2022-04/05/2022; Recert 04/07/2022-06/30/2022    Progress Note Due on Visit 100    PT Start Time 1146    PT Stop Time 1227    PT Time Calculation (min) 41 min    Equipment Utilized During Treatment Gait belt    Activity Tolerance Patient tolerated treatment well    Behavior During Therapy WFL for tasks assessed/performed                 Past Medical History:  Diagnosis Date   Acute blood loss anemia    Arthritis    Cancer (HCC)    skin   Central cord syndrome at C6 level of cervical spinal cord (HCC) 11/29/2017   Hypertension    Protein-calorie malnutrition, severe (HCC) 01/24/2018   S/P insertion of IVC (inferior vena caval) filter 01/24/2018   Tetraparesis (HCC)    Past Surgical History:  Procedure Laterality Date   ANTERIOR CERVICAL DECOMP/DISCECTOMY FUSION N/A 11/29/2017   Procedure: Cervical five-six, six-seven Anterior Cervical Decompression Fusion;  Surgeon: Jadene Pierini, MD;  Location: [facility] OR;  Service: Neurosurgery;  Laterality: N/A;  Cervical five-six, six-seven Anterior Cervical Decompression Fusion   CATARACT EXTRACTION     FEMUR IM NAIL Left 08/16/2021   Procedure: INTRAMEDULLARY (IM) NAIL FEMORAL;  Surgeon: Deeann Saint, MD;  Location: [facility] ORS;  Service: Orthopedics;  Laterality: Left;   IR IVC FILTER PLMT / S&I /IMG GUID/MOD SED  12/08/2017   Patient Active Problem List   Diagnosis Date Noted   Age-related osteoporosis  without current pathological fracture 07/27/2022   Mild recurrent major depression (HCC) 07/27/2022   Hypertensive heart disease with other congestive heart failure (HCC) 02/14/2022   Chronic indwelling Foley catheter 02/14/2022   Closed hip fracture (HCC) 08/15/2021   DVT (deep venous thrombosis) (HCC) 08/15/2021   Quadriplegia (HCC) 08/15/2021   Fall 08/15/2021   Constipation due to slow transit 08/31/2018   Trauma 06/05/2018   Neuropathic pain 06/05/2018   Neurogenic bowel 06/05/2018   Vaginal yeast infection 01/30/2018   Healthcare-associated pneumonia 01/25/2018   Chronic allergic rhinitis 01/24/2018   Depression with anxiety 01/24/2018   UTI due to Klebsiella species 01/24/2018   Protein-calorie malnutrition, severe (HCC) 01/24/2018   S/P insertion of IVC (inferior vena caval) filter 01/24/2018   Tetraparesis (HCC) 01/20/2018   Neurogenic bladder 01/20/2018   Reactive depression    Benign essential HTN    Acute postoperative anemia due to expected blood loss    Central cord syndrome at C6 level of cervical spinal cord (HCC) 11/29/2017   Allergy to alpha-gal 11/25/2016   SCC (squamous cell carcinoma) 04/08/2014    ONSET DATE: 08/15/2021 (fall with B hip fx); Initial injury was in 2019- quadraplegia due to central cord syndrome of C6 secondary to neck fracture.   REFERRING DIAG: Bilateral Hip fx; Left Open- s/p Left IM nail femoral on 08/16/2021; Right closed  THERAPY DIAG:  Muscle weakness (generalized)  Other lack of coordination  Central  cord syndrome, sequela (HCC)  Other abnormalities of gait and mobility  Difficulty in walking, not elsewhere classified  Closed fracture of both hips, initial encounter (HCC)  Abnormality of gait and mobility  Repeated falls  Unsteadiness on feet  Cervicalgia  Rationale for Evaluation and Treatment Rehabilitation  SUBJECTIVE:   SUBJECTIVE STATEMENT:   Patient reports she feels her current AFO is "cutting off her  circulation in her foot." She presents today without a brace.      Pt accompanied by:  Paid caregiver-   PERTINENT HISTORY: Sherry [name] is an 89yoF who experienced a fall at home on 08/15/2021 with hip fracture, s/p Left hip ORIF. PMH: quadriplegia due to central cord syndrome of C6 secondary to neck fracture, HTN, depression with anxiety, neurogenic bladder, bilateral DVT on Xarelto (s/p of IVC filter placement). Prior history of significant PT/OT with improvement of function. Pt has full use of shoulder/elbows, limited use of hands, adaptive self feeding sucessful.     PAIN:  None current but does endorse some left thigh pain intermittent  OBJECTIVE:    TODAY'S TREATMENT:  03/14/2023     THERAPEUTIC ACTIVITIES -Ankle DF/PF- 2 sets of 15 reps with some mobilization with movement with left ankle for optimal ROM and strength  -Active assisted reclined heel slide - allowing patient to perform in more ROM (gravity more minimized) -2 x 15 each LE -Seated LAQ 2 x 15 each LE with Terminal knee ext (static hold x 3 sec) to simulate blocking knees  -Long sit Quad set- hold 3 sec x 15 reps each to simulate terminal knee ext with standing.  - Reclined SLR- (some assist to keep Right LE straight) to focus on ROM/power/strength) and Active on left LE - 2 sets of 15 reps.      Therex:   Hip add with ball- hold 5 sec - 2 x 10 reps    - Manual resistance Leg press (long sitting) - x 15 reps-   PATIENT EDUCATION: Education details: Exercise technique, Discussion of how to obtain order/F2F for a new brace Patient verbalized understanding.   HOME EXERCISE PROGRAM: No changes at this time  Access Code: [facility] URL: [email or web address] Date: 11/23/2021 Prepared by: Precious Bard Exercises - Seated Gluteal Sets - 2 x daily - 7 x weekly - 2 sets - 10 reps - 5 hold - Seated Quad Set - 2 x daily - 7 x weekly - 2 sets - 10 reps - 5 hold - Seated Long Arc Quad - 2 x daily - 7 x weekly -  2 sets - 10 reps - 5 hold - Seated March - 2 x daily - 7 x weekly - 2 sets - 10 reps - 5 hold - Seated Hip Abduction - 2 x daily - 7 x weekly - 2 sets - 10 reps - 5 hold - Seated Shoulder Shrugs - 2 x daily - 7 x weekly - 2 sets - 10 reps - 5 hold - Wheelchair Pressure Relief - 2 x daily - 7 x weekly - 2 sets - 10 reps - 5 hold  GOALS: Goals reviewed with patient? Yes  SHORT TERM GOALS: Target date: 02/22/2022  Pt will be independent with initial UE strengthening HEP in order to improve strength and balance in order to decrease fall risk and improve function at home and work. Baseline: 10/19/2021- patient with no formal UE HEP; 12/02/2021= Patient verbalized knowledge of HEP including use of theraband for UE strengthening.  Goal status: GOAL MET  LONG TERM GOALS: Target date: 03/23/2023  Pt will be independent with final for UE/LE HEP in order to improve strength and balance in order to decrease fall risk and improve function at home and work. Baseline: Patient is currently BLE NWB and unable to participate in HEP. Has order for UE strengthening. 01/11/2022- Patient now able to participate in LE strengthening although NWB still- good understanding for some basic exercises. Will keep goal active to incorporate progressive LE strengthening exercises. 04/07/2022= Patient still participating in progressive LE seated HEP and has no questions at this time.  Goal status: MET  2.  Pt will improve FOTO to target score of 35  to display perceived improvements in ability to complete ADL's.  Baseline: 10/19/2021= 12; 12/02/2021= 17; 01/11/2022=17; 04/07/2022= 17; 07/05/2022=17; 10/06/2022- outcome measure not appropriate as patient in non-ambulatory and not consistently standing. Goal status: Goal not appropriate  3.  Pt will increase strength of B UE  by at least 1/2 MMT grade in order to demonstrate improvement in strength and function  Baseline: patient has range of 2-/5 to 4/5 BUE Strength; 12/02/2021= 4/5 except with  wrist ext. 01/11/2022= 4/5 B UE strength expept for wrist Goal status: Goal revised-no longer appropriate- working with OT on all UE strengthening.   4. Pt. Will increase strength of RLE by at least 1/2 MMT grade in order to demonstrate improvement in Standing/transfers.  Baseline: 2-/5 Right hip flex/knee ext/flex; 04/07/2022= 2- with right hip flex/knee ext (lacking 28 deg from zero) 4/8= Patient able to ext right knee lacking 18 deg from zero. 07/05/2022=Left knee approx 8 deg from zero and right knee = 23 deg from zero; 12/29/2022 - Left LE = full ROM 4-/5 and right knee ext= 23 deg from zero= 2+/5 (gravity minimized)   Goal status: Progressing  5. Pt. Will demo ability to stand pivot transfer with max assist for improved functional mobility and less dependent need on mechanical device.   Baseline: Dependent on hoyer lift for all transfers; 04/07/2022- Unable to test secondary to patient with recent UTI and right hand procedure - will attempt to reassess next visit. ; 04/12/22: Unable to successfully stand due to feet plates on loaned power w/c; 05/10/2022- Patient still having to use rental chair and has not received her original power w/c back to practice SPT. 07/04/2021= Patient just received new power w/c and attempted standing from 1st time today- Patient able to stand with max assist today from w/c and did not attempt to perform pivot transfer- difficulty with static standing and placing weight on right LE. 10/06/2022- Not assessed today- patient needs new AFO prior to attempting transfers; 12/29/2022= Patient has not attempted SPT yet - still trying to improve LE strength enough to stand well without knee buckling with weight shifting.   Goal status: ONGOING  6. Pt. Will demonstrate improved functional LE strength as seen by ability to stand > 2 min for improved transfer ability and pregait abilities.   Baseline: not assessed today due to recent dx: UTI; 04/07/2022- Unable to test secondary to patient with  recent UTI and right hand procedure - will attempt to reassess next visit; 04/12/22: Unable to successfully stand due to feet plates on loaned power w/c 05/10/2022- Patient still having to use rental chair and has not received her original power w/c back to practice Standing. 07/05/2022- Patient able to stand with max assist at trunk today for 2 min 3 sec. Will keep goal active to ensure consistency. 10/06/2022= Unable to assess today- patient  with some recent blood pressure issues and needs new AFO. 11/24/2022= Patient demonstrated multiple rounds of standing in // bars- holding up to 1:45 min today but has been able to exhibit 2 min in recent past- working on consistency. 12/29/2022= 2 min 40 sec with patient demonstrating improved overall posture and ability to contract glutes and quads with just min assist at trunk today.   Goal status: MET  7. Pt. Will demonstrate improved functional LE strength as seen by ability to stand > 5 min for improved transfer ability and pregait abilities. Baseline: 12/29/2022= 2 min 40 sec with patient demonstrating improved overall posture and ability to contract glutes and quads with just min assist at trunk today.  Goal Status: NEW  8. Patient will perform sit to stand transfer with moderate assist consistently > 75% of time for improved transfer ability and less dependence on caregiver.   Baseline: 12/29/2022- Patient currently max assist level assistance with all sit to stand activities.   Goal status: New   ASSESSMENT:  CLINICAL IMPRESSION:   Treatment was modified as patient did not present with a brace on Left LE. She was able to incorporate some more active Right LE muscle activation with more gravity minimized positioning. She demonstrated improving terminal knee ext to potentially assist with improved standing in future visits.  Pt will continue to benefit from skilled PT services to optimize independence and reduced caregiver support.    OBJECTIVE IMPAIRMENTS  Abnormal gait, decreased activity tolerance, decreased balance, decreased coordination, decreased endurance, decreased mobility, difficulty walking, decreased ROM, decreased strength, hypomobility, impaired flexibility, impaired UE functional use, postural dysfunction, and pain.   ACTIVITY LIMITATIONS carrying, lifting, bending, sitting, standing, squatting, sleeping, stairs, transfers, bed mobility, continence, bathing, toileting, dressing, self feeding, reach over head, hygiene/grooming, and caring for others  PARTICIPATION LIMITATIONS: meal prep, cleaning, laundry, medication management, personal finances, interpersonal relationship, driving, shopping, community activity, and yard work  PERSONAL FACTORS Age, Time since onset of injury/illness/exacerbation, and 1-2 comorbidities: HTN, cervical Sx  are also affecting patient's functional outcome.   REHAB POTENTIAL: Good  CLINICAL DECISION MAKING: Evolving/moderate complexity  EVALUATION COMPLEXITY: Moderate  PLAN: PT FREQUENCY: 1-2x/week  PT DURATION: 12 weeks  PLANNED INTERVENTIONS: Therapeutic exercises, Therapeutic activity, Neuromuscular re-education, Balance training, Gait training, Patient/Family education, Self Care, Joint mobilization, DME instructions, Dry Needling, Electrical stimulation, Wheelchair mobility training, Spinal mobilization, Cryotherapy, Moist heat, and Manual therapy  PLAN FOR NEXT SESSION:  LE and core strength training, Sit to stand and static standing; progress to SPT as able.     8:24 AM, 03/15/23 Louis Meckel, PT Physical Therapist - [facility]  Outpatient Physical Therapy- Main Campus [phone]

## 2023-03-14 NOTE — Therapy (Signed)
 Occupational Therapy Neuro Treatment Note        Patient Name: Sherry [name] MRN: [id number] DOB:September 14, 1934, 89 y.o., female Today's Date: 11/29/2022  PCP:  Waunita Haff PROVIDER: Valrie Gehrig, MD  END OF SESSION:   OT End of Session - 03/14/23 1158     Visit Number [id number]    Date for OT Re-Evaluation 04/13/23    OT Start Time 1107    OT Stop Time 1145    OT Time Calculation (min) 38 min    Activity Tolerance Patient tolerated treatment well    Behavior During Therapy WFL for tasks assessed/performed                 Past Medical History:  Diagnosis Date   Acute blood loss anemia    Arthritis    Cancer (HCC)    skin   Central cord syndrome at C6 level of cervical spinal cord (HCC) 11/29/2017   Hypertension    Protein-calorie malnutrition, severe (HCC) 01/24/2018   S/P insertion of IVC (inferior vena caval) filter 01/24/2018   Tetraparesis (HCC)    Past Surgical History:  Procedure Laterality Date   ANTERIOR CERVICAL DECOMP/DISCECTOMY FUSION N/A 11/29/2017   Procedure: Cervical five-six, six-seven Anterior Cervical Decompression Fusion;  Surgeon: Cannon Champion, MD;  Location: [facility] OR;  Service: Neurosurgery;  Laterality: N/A;  Cervical five-six, six-seven Anterior Cervical Decompression Fusion   CATARACT EXTRACTION     FEMUR IM NAIL Left 08/16/2021   Procedure: INTRAMEDULLARY (IM) NAIL FEMORAL;  Surgeon: Marlynn Singer, MD;  Location: [facility] ORS;  Service: Orthopedics;  Laterality: Left;   IR IVC FILTER PLMT / S&I /IMG GUID/MOD SED  12/08/2017   Patient Active Problem List   Diagnosis Date Noted   Age-related osteoporosis without current pathological fracture 07/27/2022   Mild recurrent major depression (HCC) 07/27/2022   Hypertensive heart disease with other congestive heart failure (HCC) 02/14/2022   Chronic indwelling Foley catheter 02/14/2022   Closed hip fracture (HCC) 08/15/2021   DVT (deep venous thrombosis) (HCC) 08/15/2021   Quadriplegia (HCC)  08/15/2021   Fall 08/15/2021   Constipation due to slow transit 08/31/2018   Trauma 06/05/2018   Neuropathic pain 06/05/2018   Neurogenic bowel 06/05/2018   Vaginal yeast infection 01/30/2018   Healthcare-associated pneumonia 01/25/2018   Chronic allergic rhinitis 01/24/2018   Depression with anxiety 01/24/2018   UTI due to Klebsiella species 01/24/2018   Protein-calorie malnutrition, severe (HCC) 01/24/2018   S/P insertion of IVC (inferior vena caval) filter 01/24/2018   Tetraparesis (HCC) 01/20/2018   Neurogenic bladder 01/20/2018   Reactive depression    Benign essential HTN    Acute postoperative anemia due to expected blood loss    Central cord syndrome at C6 level of cervical spinal cord (HCC) 11/29/2017   Allergy to alpha-gal 11/25/2016   SCC (squamous cell carcinoma) 04/08/2014   ONSET DATE: 11/29/2017  REFERRING DIAG: Central Cord Syndrome at C6 of the Cervical Spinal Cord, Fall with Bilateral Closed Hip Fractures, with ORIF repair with Intramedullary Nailing of the Left Hip Fracture.   THERAPY DIAG:  Muscle weakness (generalized)  Other lack of coordination  Rationale for Evaluation and Treatment: Rehabilitation  SUBJECTIVE:   SUBJECTIVE STATEMENT:  Pt. reports that she watched the superbowl last night  Pt accompanied by: self, Personal Care aide  PERTINENT HISTORY: Central Cord Syndrome at C6 of the Cervical Spinal Cord, Fall with Bilateral Closed Hip Fractures, with ORIF repair with Intramedullary Nailing of the Left Hip Fracture.   PRECAUTIONS: None  WEIGHT BEARING RESTRICTIONS: No  PAIN:  Are you having pain? No pain, just shoulder stiffness   LIVING ENVIRONMENT: Lives with: [facility]  Stairs: No Has following equipment at home: Wheelchair (power)  PLOF: Independent   PATIENT GOALS:  See below for established goals  OBJECTIVE:  Note: Objective measures were completed at Evaluation unless otherwise noted.  HAND DOMINANCE:  Right  ADLs: Caregiver assist with ADLs, and [facility]  MOBILITY STATUS: Uses a power w/c  ACTIVITY TOLERANCE: Activity tolerance: Fair  FUNCTIONAL OUTCOME MEASURES:  Measurements:   12/09/2021:   Shoulder flexion: R: 120(136), L: 138(150) Shoulder abduction: R: 108(120), L: 110(120) Elbow: R: 0-148, L: 0-146 Wrist flexion: R: 55(62), L: 78 Writ extension: R: 34(55), L: 3(4) RD: R: 12(20), L: 16(26) UD: R: 8(24), L: 6(18) Thumb radial abduction: R: 11(20), L: 8(20) Digit flexion to the [facility]: R:  2nd: 5cm(3.5cm), 3rd: 7.5 cm(5cm), 4th: 7cm(3cm), 5th: 6cm(5.5cm) L: 2nd: 8cm(8cm), 3rd: 8cm(8cm), 4th: 7.5cm(7cm), 5th: 7cm(7cm)   02/03/2022:   Shoulder flexion: R: 122(138), L: 148(154) Shoulder abduction: R: 109(125), L: 125(125 Elbow: R: 0-148, L: 0-146 Wrist flexion: R: 60(68), L: 79 Writ extension: R: 40(55), L: 3(10) RD: R: 14(24), L: 20(28) UD: R: 8(24), L: 6(18) Thumb radial abduction: R: 20(25), L: 8(20) Digit flexion to the [facility]: R:  2nd: 4.5cm(3cm), 3rd: 6.5cm(4.5cm), 4th: 6.5 cm(3cm), 5th: 4.5cm(5cm) L: 2nd: 8cm(8cm), 3rd: 8cm(7cm), 4th: 7.5cm(7cm), 5th: 6.5cm(6.5cm)   04/07/2022:   Shoulder flexion: R: 125(150), L: 160(160) Shoulder abduction: R: 109(125), L: 125(125 Elbow: R: 0-148, L: 0-146 Wrist flexion: R: 60(68), L: 79 Writ extension: R: 40(55), L: 5(18) RD: R: 14(24), L: 20(28) UD: R: 18(24), L: 8(18) Thumb radial abduction: R: 20(25), L: 8(20) Digit flexion to the [facility]: R:  2nd: 4.5 cm(4 cm), 3rd: 6.5cm(3cm), 4th: 7 cm (5cm), 5th: 5cm(5cm) L: 2nd: 8cm(8cm), 3rd: 8cm(7cm), 4th: 7.5cm(7cm), 5th: 7cm(7cm)   07/05/2022:   Shoulder flexion: R: 125(154), L: 160(160) Shoulder abduction: R: 95(130), L: 143(143) Elbow: R: 0-148, L: 0-146 Wrist flexion: R: 60(68), L: 79 Writ extension: R: 40(60), L: 0(12) RD: R: 10(24), L: 12(20) UD: R: 16(24), L: 8(16) Thumb radial abduction: R: 20(25), L: 8(20) Digit flexion to the [facility]: R:  2nd: 4 cm(4cm), 3rd:  6.5cm( 3.5cm), 4th: 7 cm (5cm), 5th: 6cm(5cm) L: 2nd: 8cm(8cm), 3rd: 8cm(7cm), 4th: 8cm(7cm), 5th: 7cm(7cm)   09/13/2022:   Shoulder flexion: R: 125(154), L: 160(160) Shoulder abduction: R: 100(130), L: 143(143) Elbow: R: 0-150, L: 0-150 Wrist flexion: R: 55(68), L: 79 Writ extension: R: 45(60), L: 0(12) RD: R: 10(24), L: 12(20) UD: R: 16(24), L: 8(16) Thumb radial abduction: R: 20(25), L: 8(20) Digit flexion to the [facility]: R:  2nd: 4 cm(4cm), 3rd: 6cm( 5cm), 4th: 6 cm (4cm), 5th: 5cm(5cm) L: 2nd: 7cm(cm), 3rd: 7cm(6cm), 4th: 7cm(6cm), 5th: 6cm(6cm)   10/27/2022   Shoulder flexion: R: 128(154), L: 160(160) Shoulder abduction: R: 105(130), L: 143(143) Elbow: R: 0-150, L: 0-150 Wrist flexion: R: 55(68), L: 79 Writ extension: R: 46(64), L: 0(20) RD: R: 10(20), L: 18(24) UD: R: 118(28), L: 8(20) Thumb radial abduction: R: 30(42), L:20(24) Digit flexion to the [facility]: R:  2nd: 4 cm(3cm), 3rd: 6cm( 6cm), 4th: 6 cm (5cm), 5th: 5cm(5cm) L: 2nd: 8cm(8cm), 3rd: 8cm(6cm), 4th: 8cm(7cm), 5th: 6cm(6cm)  01/19/2023  Shoulder flexion: R: 131(154), L: 160(160) Shoulder abduction: R: 110(130), L: 143(143) Elbow: R: 0-150, L: 0-150 Wrist flexion: R: 55(68), L: 79 Writ extension: R: 46(64), L: 0(20) RD: R: 10(20),  L: 18(24) UD: R: 118(28), L: 14(20) Thumb radial abduction: R: 38(48), L:24(26) Thumb IP flexion: R: 35(50) L: 20(45) Digit flexion to the [facility]: R:  2nd: 3.5 cm(3cm), 3rd: 6cm( 6cm), 4th: 6 cm (5cm), 5th: 5cm(4.5cm) L: 2nd: 8cm(7.5cm), 3rd: 8cm(6cm), 4th: 7.5cm(6cm), 5th: 6cm(4.5cm)  COORDINATION:   01/19/2023:  9 hole peg test:  Right: 2 pegs placed in 1 min. & 5 sec.  Pt. was able to remove 9 pegs in 17 sec.  Left: 2 pegs placed in 1 min. & 10 sec. Pt. was able to remove 9 pegs in 20 sec.  SENSATION: Intact  EDEMA: N/A   COGNITION: Overall cognitive status: WNL   VISION: Subjective report: Wears glasses, No changes in vision.  PERCEPTION: Intact  TODAY'S  TREATMENT:                                                                                                                              DATE:  03/14/23   Manual Therapy:   Pt. tolerated soft tissue massage to the volar, and dorsal surface of each digit on the bilateral hands, digit lateral, and sagittal bands 2/2 to stiffness following moist heat modality. Soft tissue mobilization was performed for carpal, and metacarpal spread stretches. Manual therapy was performed independent of, and in preparation for therapeutic Ex.     Therapeutic Ex.    Pt. tolerated PROM followed by AROM bilateral wrist extension, PROM for bilateral digit MP, PIP, and DIP flexion, and extension, thumb abduction. Pt. worked on [employer], and reciprocal motion using the UBE while seated for 5 min. with no resistance.   Therapeutic Ex:  Facilitated lateral grasp patterns removing 1" resistive cubes, and replacing them using isolated 2nd digit extension to press them into place. The task was placed at an elevated vertical angle to increase UE challenge, promote functional reach in multiple goal directed planes, and promote wrist extension.      PATIENT EDUCATION: Education details: Bilateral hand function, ROM, and strengthening, reaching, and [facility]  Person educated: Patient Education method: Explanation, Demonstration, and Verbal cues Education comprehension: verbalized understanding and returned demonstration  HOME EXERCISE PROGRAM: Continue to assess HEP needs, and provide as needed.   GOALS: Goals reviewed with patient? Yes  LONG TERM GOALS: Target date: 04/13/2023  3.  Patient will demonstrate improved composite finger flexion to be able to firmly hold and use adaptive devices during ADLs, and IADLs.  Baseline: 01/19/2023: Pt. Is improving with Bilateral digit flexion to the [facility]. Pt. has difficulty firmly holding adaptive devices. 12/06/2022: Pt. continues to present with increased MP, PIP, and DIP digit  tightness/stiffness limiting the formulation of bilateral composites fists. 10/27/2022: Pt. presents with increased MP, PIP, and DIP digit tightness/stiffness limiting the formulation of bilateral composites fists during this progress reporting period limiting. Digit flexion to the [facility]: R:  2nd: 4 cm(3cm), 3rd: 6cm( 6cm), 4th: 6 cm (5cm), 5th: 5cm(5cm), L: 2nd: 8cm(8cm), 3rd: 8cm(6cm), 4th: 8cm(7cm), 5th:  6cm(6cm) 09/20/2022: Improving digit flexion to the [facility]. 09/13/22: improved digit flexion to the [facility]. 07/05/2022: Pt. presents with digit MP, PIP, and DIP extensor tightness limiting her ability to securely grip objects in her bilateral hands.  04/07/2022: Pt. Has improved with right 2nd, and 3rd digit flexion towards the [facility]. Pt. Continues to have difficulty securely holding and applying deodorant.02/03/2022: Pt. Has improved with digit flexion, however, continues to have difficulty securely holding and using deodorant. 12/09/2021: Bilateral hand/digit MP, PIP, and DIP extension tightness limits her ability achieve digit flexion to hold, and apply deodorant. 10/28/2021:  Pt. continues to have difficulty holding the deodorant. 01/12/2021: Pt. Presents with limited digit extension. Pt. Is able to initiate holding deodorant, however is unable to hold it while using it  Goal status: Ongoing   4.  Pt. Will improve bilateral wrist extension in preparation for anticipating, and initiating reaching for objects at the table.  Baseline: 01/19/2023:  R: 46(64) L: 0(20) 12/06/2022: Pt continues to to progress with bilateral wrist extension in preparation fro functional reaching. 10/27/2022: Pt. Is improving with bilateral wrist extension. R: 46(64) L: 0(20) 09/20/2022: limited PROM in the Left wrist extension 2/2 tightness/stiffness. 09/13/22: R: 55(68), L: 0(12) 07/19/2022: Bilateral wrist extension in limited. 07/05/2022: Right: 40(60) Left: 0(12) 04/07/2022: Right: 40(55) Left: 5(18) 02/03/2022: Right: 34(55) Left: 3(4) 12/09/2021:  Right  34(55)  Left  3(4) 10/28/2021:  Right 22(38), Left 0(15) 09/14/2021:  Right: 17(35), left 2(15)  Goal status: Limited progress with wrist extension  this progress reporting period. Ongoing  7.  Pt. Will increase bilateral lateral pinch strength by 2 lbs to be able to securely grasp items during ADLs, and IADL tasks.  Baseline: 01/19/2023: Deferred as the Pinch meter is out for calibration.12/06/2022: Pt. Is able to more securely hold objects during ADLs/IADLs 10/27/2022: Pt. Is improving with holding items with her bilateral thumbs. (Pinch meter out for calibration) 09/20/2022: lateral pinch continues to be limited 09/13/22: R 6.5# L 4# 07/19/2022: TBD 07/05/2022: TBD 07/2022: NT-Pinch meter out for calibration. 02/03/2022: Right: 6#, Left: 4# 12/09/2021: Right: 6#, Left: 4#  Goal status: Deferred   9.  Pt. will complete plant care with modified independence.  Baseline:12/06/2022: Pt. Is independent with plant care in her husband's room, however is not able to access her plants due to furniture blocking access to them. 10/27/2022: Pt. Is able to water plants that are closer, and within reach. Pt. continues to have difficulty reaching for thorough plant care. 09/20/2022: Pt. Continues to have difficulty reaching for thorough plant care. 09/13/22: continues to report intermittent difficulty 07/19/2022: Pt. Continues to be able to water, and care for some of her plants. Pt. Has more difficulty with plants that are harder to reach. 07/05/2022: pt. Is able to water, and care for some of her plants. Pt. Has more difficulty with plants that are harder to reach. 04/07/2022: Pt. is now able to hold a cup and water her plants.02/03/2022: Pt. has difficulty caring for her plants.  Goal status:Achieved   10.  Pt. will demonstrate adaptive techniques to assist with the efficiency of self-dressing, or morning care tasks.  Baseline: 01/19/2203: Pt. Is able to donn her jacket in reverse independently. Pt. requires assist from  staff, as Pt. Reports staff have decreased time. 12/06/2022: Pt. requires assist from staff, as Pt. Reports staff have decreased time. 10/27/2022: Pt. is able to assist with initiating UE dressing. Pt. requires assist from personal, and staff care aides. 09/20/2022: Pt. continues to require assist form personal,  and staff care aides. 09/13/22: MODA dressing, reports she is often rushed 07/19/2022: Pt. continues to require assist with self-dressing/morning care tasks. 07/05/2022: Continue with goal. 04/07/2022: Pt. Continues to require assist with the efficiency of self-dressing, and morning care tasks.02/03/2022: Pt. requires assist from caregivers 2/2 time limitations during morning care.  Goal status: Ongoing  11.  Pt. will improve BUE strength to improve ADL, and IADL functioning.  Baseline: 01/19/2023: L 4/5, R 3+/5 12/06/2022: Continue 10/27/2022: shoulder flexion L 4/5, R 3+/5; shoulder abduction L 4+/5, R 3+/5. elbow flexion B 5/5, elbow extension 4/5 09/20/2022: Pt. Presents with limited BUE strength 09/13/22: shoulder flexion L 4/5, R 3/5; elbow flexion B 5/5, elbow extension 4-/5; shoulder abduction L 4+/5, R 3+/5. 07/19/2022: BUE strength continues to be limited. 07/05/2022: shoulder flexion: right 4-/5, abduction: 3+/5, elbow flexion: right: 5/5, left 5/5, extension: right: 4-/5, left 4-/5, wrist extension: right: 3-/5, left: 2-/5  Goal status: Ongoing  12.  Pt. will improve bilateral thumb radial abduction in order to be able to hold the grab bars while standing with PT  Baseline: 01/19/2023: R: 38(48) L: 24(26)12/06/2022: Pt. Continues to present  with limited thumb abduction, however is improving holding onto the parallel bars.10/27/2022: thumb radial abduction: R: 30(42), L: 20(24  Goal status: Ongoing  13.  Pt. Will be able to securely hold, and stabilize medication bottles at a tabletop surface when opening, and closing them.  Baseline: 01/19/2023: Pt. Is now able to securely hold medication bottles  while opening them, and stabilizes them on surfaces while opening them. 12/06/2022: Pt is improving with securely stabilizing medication bottles. 10/27/2022: Pt. Stabilizes bottles against her torso when attempting to open, and close them   Goal status: Achieved  14: Pt. Will improve bilateral thumb IP flexion to improve active grasping patterns.    Baseline: 01/19/2023: Right 35(50), Left: 20/45)    Goal status: New    15. Pt. Will improve bilateral FMC/speed, and dexterity. As evidence by improved scores on the 9 hole peg test.   Baseline: 01/19/2023: 2 pegs placed in 1 min. & 5 sec.  Pt. was able to remove 9 pegs in 17 sec.; Left: 2 pegs placed in 1 min. & 10    sec. Pt. was able to remove 9 pegs in 20 sec.   Goal status: New      ASSESSMENT:  CLINICAL IMPRESSION:  Pt. Continues to utilize a  lateral pinch grasp to remove 100% of the resistive cubes. Pt. required cues, and assist to formulate 2nd digit extension to press them back into place. Pt. Presents with difficulty flexing the 3rd, 4th, and 5th digits when isolating the 2nd digit. Pt. presents with improving extended reach, with improved digit extension to release the cubes. Pt. continues to benefit form OT services to work on impoving ROM, and UE strength in order to work towards increasing bilateral hand grasp on objects, and increasing engagement of bilateral hands during ADLs, and IADL tasks.      PERFORMANCE DEFICITS: in functional skills including ADLs, IADLs, coordination, dexterity, ROM, strength, and UE functional use, cognitive skills including , and psychosocial skills including coping strategies and environmental adaptation.   IMPAIRMENTS: are limiting patient from ADLs, IADLs, and leisure.   CO-MORBIDITIES: may have co-morbidities  that affects occupational performance. Patient will benefit from skilled OT to address above impairments and improve overall function.  MODIFICATION OR ASSISTANCE TO COMPLETE EVALUATION:  Min-Moderate modification of tasks or assist with assess necessary to complete an evaluation.  OT  OCCUPATIONAL PROFILE AND HISTORY: Detailed assessment: Review of records and additional review of physical, cognitive, psychosocial history related to current functional performance.  CLINICAL DECISION MAKING: Moderate - several treatment options, min-mod task modification necessary  REHAB POTENTIAL: Good for stated goals  PLAN:  OT FREQUENCY 2x's a week  OT DURATION: 12 weeks  PLANNED INTERVENTIONS ADL training, A/E training, UE ther. Ex, Manual therapy, neuromuscular re-education, moist heat modality, Paraffin Bath, Splinting, and  Pt./caregiver education   RECOMMENDED OTHER SERVICES: PT  CONSULTED AND AGREED WITH PLAN OF CARE: Patient  PLAN FOR NEXT SESSION: Continue Treatment as per established POC  Duey Ghent, MS, OTR/L  03/07/2023

## 2023-03-16 ENCOUNTER — Encounter: Payer: Self-pay | Admitting: Occupational Therapy

## 2023-03-16 ENCOUNTER — Ambulatory Visit: Payer: Medicare HMO | Admitting: Occupational Therapy

## 2023-03-16 ENCOUNTER — Ambulatory Visit: Payer: Medicare HMO

## 2023-03-16 DIAGNOSIS — M6281 Muscle weakness (generalized): Secondary | ICD-10-CM

## 2023-03-16 DIAGNOSIS — S14129S Central cord syndrome at unspecified level of cervical spinal cord, sequela: Secondary | ICD-10-CM

## 2023-03-16 DIAGNOSIS — S72001A Fracture of unspecified part of neck of right femur, initial encounter for closed fracture: Secondary | ICD-10-CM

## 2023-03-16 DIAGNOSIS — R278 Other lack of coordination: Secondary | ICD-10-CM

## 2023-03-16 DIAGNOSIS — R269 Unspecified abnormalities of gait and mobility: Secondary | ICD-10-CM

## 2023-03-16 DIAGNOSIS — R262 Difficulty in walking, not elsewhere classified: Secondary | ICD-10-CM

## 2023-03-16 DIAGNOSIS — R2689 Other abnormalities of gait and mobility: Secondary | ICD-10-CM

## 2023-03-16 NOTE — Therapy (Signed)
Occupational Therapy Neuro Treatment Note        Patient Name: Sherry [name] MRN: [id number] DOB:Jul 03, 1934, 89 y.o., female Today's Date: 11/29/2022  PCP:  Cindi Carbon PROVIDER: Earnestine Mealing, MD  END OF SESSION:   OT End of Session - 03/16/23 1100     Visit Number [id number]    Number of Visits 168    Date for OT Re-Evaluation 04/13/23    OT Start Time 1100    OT Stop Time 1145    OT Time Calculation (min) 45 min    Activity Tolerance Patient tolerated treatment well    Behavior During Therapy WFL for tasks assessed/performed                 Past Medical History:  Diagnosis Date   Acute blood loss anemia    Arthritis    Cancer (HCC)    skin   Central cord syndrome at C6 level of cervical spinal cord (HCC) 11/29/2017   Hypertension    Protein-calorie malnutrition, severe (HCC) 01/24/2018   S/P insertion of IVC (inferior vena caval) filter 01/24/2018   Tetraparesis (HCC)    Past Surgical History:  Procedure Laterality Date   ANTERIOR CERVICAL DECOMP/DISCECTOMY FUSION N/A 11/29/2017   Procedure: Cervical five-six, six-seven Anterior Cervical Decompression Fusion;  Surgeon: Jadene Pierini, MD;  Location: [facility] OR;  Service: Neurosurgery;  Laterality: N/A;  Cervical five-six, six-seven Anterior Cervical Decompression Fusion   CATARACT EXTRACTION     FEMUR IM NAIL Left 08/16/2021   Procedure: INTRAMEDULLARY (IM) NAIL FEMORAL;  Surgeon: Deeann Saint, MD;  Location: [facility] ORS;  Service: Orthopedics;  Laterality: Left;   IR IVC FILTER PLMT / S&I /IMG GUID/MOD SED  12/08/2017   Patient Active Problem List   Diagnosis Date Noted   Age-related osteoporosis without current pathological fracture 07/27/2022   Mild recurrent major depression (HCC) 07/27/2022   Hypertensive heart disease with other congestive heart failure (HCC) 02/14/2022   Chronic indwelling Foley catheter 02/14/2022   Closed hip fracture (HCC) 08/15/2021   DVT (deep venous thrombosis) (HCC)  08/15/2021   Quadriplegia (HCC) 08/15/2021   Fall 08/15/2021   Constipation due to slow transit 08/31/2018   Trauma 06/05/2018   Neuropathic pain 06/05/2018   Neurogenic bowel 06/05/2018   Vaginal yeast infection 01/30/2018   Healthcare-associated pneumonia 01/25/2018   Chronic allergic rhinitis 01/24/2018   Depression with anxiety 01/24/2018   UTI due to Klebsiella species 01/24/2018   Protein-calorie malnutrition, severe (HCC) 01/24/2018   S/P insertion of IVC (inferior vena caval) filter 01/24/2018   Tetraparesis (HCC) 01/20/2018   Neurogenic bladder 01/20/2018   Reactive depression    Benign essential HTN    Acute postoperative anemia due to expected blood loss    Central cord syndrome at C6 level of cervical spinal cord (HCC) 11/29/2017   Allergy to alpha-gal 11/25/2016   SCC (squamous cell carcinoma) 04/08/2014   ONSET DATE: 11/29/2017  REFERRING DIAG: Central Cord Syndrome at C6 of the Cervical Spinal Cord, Fall with Bilateral Closed Hip Fractures, with ORIF repair with Intramedullary Nailing of the Left Hip Fracture.   THERAPY DIAG:  Muscle weakness (generalized)  Other lack of coordination  Rationale for Evaluation and Treatment: Rehabilitation  SUBJECTIVE:   SUBJECTIVE STATEMENT:  Pt. reports that she wants to watch the dog show.   Pt accompanied by: self, Personal Care aide  PERTINENT HISTORY: Central Cord Syndrome at C6 of the Cervical Spinal Cord, Fall with Bilateral Closed Hip Fractures, with ORIF repair with Intramedullary Nailing  of the Left Hip Fracture.   PRECAUTIONS: None  WEIGHT BEARING RESTRICTIONS: No  PAIN:  Are you having pain? No pain, just shoulder stiffness   LIVING ENVIRONMENT: Lives with: [facility]  Stairs: No Has following equipment at home: Wheelchair (power)  PLOF: Independent   PATIENT GOALS:  See below for established goals  OBJECTIVE:  Note: Objective measures were completed at Evaluation unless  otherwise noted.  HAND DOMINANCE: Right  ADLs: Caregiver assist with ADLs, and [facility]  MOBILITY STATUS: Uses a power w/c  ACTIVITY TOLERANCE: Activity tolerance: Fair  FUNCTIONAL OUTCOME MEASURES:  Measurements:   12/09/2021:   Shoulder flexion: R: 120(136), L: 138(150) Shoulder abduction: R: 108(120), L: 110(120) Elbow: R: 0-148, L: 0-146 Wrist flexion: R: 55(62), L: 78 Writ extension: R: 34(55), L: 3(4) RD: R: 12(20), L: 16(26) UD: R: 8(24), L: 6(18) Thumb radial abduction: R: 11(20), L: 8(20) Digit flexion to the [facility]: R:  2nd: 5cm(3.5cm), 3rd: 7.5 cm(5cm), 4th: 7cm(3cm), 5th: 6cm(5.5cm) L: 2nd: 8cm(8cm), 3rd: 8cm(8cm), 4th: 7.5cm(7cm), 5th: 7cm(7cm)   02/03/2022:   Shoulder flexion: R: 122(138), L: 148(154) Shoulder abduction: R: 109(125), L: 125(125 Elbow: R: 0-148, L: 0-146 Wrist flexion: R: 60(68), L: 79 Writ extension: R: 40(55), L: 3(10) RD: R: 14(24), L: 20(28) UD: R: 8(24), L: 6(18) Thumb radial abduction: R: 20(25), L: 8(20) Digit flexion to the [facility]: R:  2nd: 4.5cm(3cm), 3rd: 6.5cm(4.5cm), 4th: 6.5 cm(3cm), 5th: 4.5cm(5cm) L: 2nd: 8cm(8cm), 3rd: 8cm(7cm), 4th: 7.5cm(7cm), 5th: 6.5cm(6.5cm)   04/07/2022:   Shoulder flexion: R: 125(150), L: 160(160) Shoulder abduction: R: 109(125), L: 125(125 Elbow: R: 0-148, L: 0-146 Wrist flexion: R: 60(68), L: 79 Writ extension: R: 40(55), L: 5(18) RD: R: 14(24), L: 20(28) UD: R: 18(24), L: 8(18) Thumb radial abduction: R: 20(25), L: 8(20) Digit flexion to the [facility]: R:  2nd: 4.5 cm(4 cm), 3rd: 6.5cm(3cm), 4th: 7 cm (5cm), 5th: 5cm(5cm) L: 2nd: 8cm(8cm), 3rd: 8cm(7cm), 4th: 7.5cm(7cm), 5th: 7cm(7cm)   07/05/2022:   Shoulder flexion: R: 125(154), L: 160(160) Shoulder abduction: R: 95(130), L: 143(143) Elbow: R: 0-148, L: 0-146 Wrist flexion: R: 60(68), L: 79 Writ extension: R: 40(60), L: 0(12) RD: R: 10(24), L: 12(20) UD: R: 16(24), L: 8(16) Thumb radial abduction: R: 20(25), L: 8(20) Digit flexion to  the [facility]: R:  2nd: 4 cm(4cm), 3rd: 6.5cm( 3.5cm), 4th: 7 cm (5cm), 5th: 6cm(5cm) L: 2nd: 8cm(8cm), 3rd: 8cm(7cm), 4th: 8cm(7cm), 5th: 7cm(7cm)   09/13/2022:   Shoulder flexion: R: 125(154), L: 160(160) Shoulder abduction: R: 100(130), L: 143(143) Elbow: R: 0-150, L: 0-150 Wrist flexion: R: 55(68), L: 79 Writ extension: R: 45(60), L: 0(12) RD: R: 10(24), L: 12(20) UD: R: 16(24), L: 8(16) Thumb radial abduction: R: 20(25), L: 8(20) Digit flexion to the [facility]: R:  2nd: 4 cm(4cm), 3rd: 6cm( 5cm), 4th: 6 cm (4cm), 5th: 5cm(5cm) L: 2nd: 7cm(cm), 3rd: 7cm(6cm), 4th: 7cm(6cm), 5th: 6cm(6cm)   10/27/2022   Shoulder flexion: R: 128(154), L: 160(160) Shoulder abduction: R: 105(130), L: 143(143) Elbow: R: 0-150, L: 0-150 Wrist flexion: R: 55(68), L: 79 Writ extension: R: 46(64), L: 0(20) RD: R: 10(20), L: 18(24) UD: R: 118(28), L: 8(20) Thumb radial abduction: R: 30(42), L:20(24) Digit flexion to the [facility]: R:  2nd: 4 cm(3cm), 3rd: 6cm( 6cm), 4th: 6 cm (5cm), 5th: 5cm(5cm) L: 2nd: 8cm(8cm), 3rd: 8cm(6cm), 4th: 8cm(7cm), 5th: 6cm(6cm)  01/19/2023  Shoulder flexion: R: 131(154), L: 160(160) Shoulder abduction: R: 110(130), L: 143(143) Elbow: R: 0-150, L: 0-150 Wrist flexion: R: 55(68), L:  79 Writ extension: R: 46(64), L: 0(20) RD: R: 10(20), L: 18(24) UD: R: 118(28), L: 14(20) Thumb radial abduction: R: 38(48), L:24(26) Thumb IP flexion: R: 35(50) L: 20(45) Digit flexion to the [facility]: R:  2nd: 3.5 cm(3cm), 3rd: 6cm( 6cm), 4th: 6 cm (5cm), 5th: 5cm(4.5cm) L: 2nd: 8cm(7.5cm), 3rd: 8cm(6cm), 4th: 7.5cm(6cm), 5th: 6cm(4.5cm)  COORDINATION:   01/19/2023:  9 hole peg test:  Right: 2 pegs placed in 1 min. & 5 sec.  Pt. was able to remove 9 pegs in 17 sec.  Left: 2 pegs placed in 1 min. & 10 sec. Pt. was able to remove 9 pegs in 20 sec.  SENSATION: Intact  EDEMA: N/A   COGNITION: Overall cognitive status: WNL   VISION: Subjective report: Wears glasses, No changes in  vision.  PERCEPTION: Intact  TODAY'S TREATMENT:                                                                                                                              DATE:  03/16/23    Therapeutic Ex.    Pt. tolerated AROM bilateral wrist extension and B shoulder horizontal ab/adduction. Pt. worked on [employer], and reciprocal motion using the UBE while seated for 8 min. With minimal resistance alternating forward and backward.    Therapeutic Activity:  Facilitated lateral grasp patterns removing 1" resistive cubes, and replacing them using isolated 2nd digit extension to press them into place. The task was placed at an vertical angle to remove and horizontal angle to replace cubes to promote functional reach with wrist extension. Performed with L and R hand, requires use of string tab to remove with L hand.     PATIENT EDUCATION: Education details: Bilateral hand function, ROM, and strengthening, reaching, and [facility]  Person educated: Patient Education method: Explanation, Demonstration, and Verbal cues Education comprehension: verbalized understanding and returned demonstration  HOME EXERCISE PROGRAM: Continue to assess HEP needs, and provide as needed.   GOALS: Goals reviewed with patient? Yes  LONG TERM GOALS: Target date: 04/13/2023  3.  Patient will demonstrate improved composite finger flexion to be able to firmly hold and use adaptive devices during ADLs, and IADLs.  Baseline: 01/19/2023: Pt. Is improving with Bilateral digit flexion to the [facility]. Pt. has difficulty firmly holding adaptive devices. 12/06/2022: Pt. continues to present with increased MP, PIP, and DIP digit tightness/stiffness limiting the formulation of bilateral composites fists. 10/27/2022: Pt. presents with increased MP, PIP, and DIP digit tightness/stiffness limiting the formulation of bilateral composites fists during this progress reporting period limiting. Digit flexion to the [facility]: R:  2nd: 4  cm(3cm), 3rd: 6cm( 6cm), 4th: 6 cm (5cm), 5th: 5cm(5cm), L: 2nd: 8cm(8cm), 3rd: 8cm(6cm), 4th: 8cm(7cm), 5th: 6cm(6cm) 09/20/2022: Improving digit flexion to the [facility]. 09/13/22: improved digit flexion to the [facility]. 07/05/2022: Pt. presents with digit MP, PIP, and DIP extensor tightness limiting her ability to securely grip objects in her bilateral hands.  04/07/2022: Pt. Has improved with  right 2nd, and 3rd digit flexion towards the [facility]. Pt. Continues to have difficulty securely holding and applying deodorant.02/03/2022: Pt. Has improved with digit flexion, however, continues to have difficulty securely holding and using deodorant. 12/09/2021: Bilateral hand/digit MP, PIP, and DIP extension tightness limits her ability achieve digit flexion to hold, and apply deodorant. 10/28/2021:  Pt. continues to have difficulty holding the deodorant. 01/12/2021: Pt. Presents with limited digit extension. Pt. Is able to initiate holding deodorant, however is unable to hold it while using it  Goal status: Ongoing   4.  Pt. Will improve bilateral wrist extension in preparation for anticipating, and initiating reaching for objects at the table.  Baseline: 01/19/2023:  R: 46(64) L: 0(20) 12/06/2022: Pt continues to to progress with bilateral wrist extension in preparation fro functional reaching. 10/27/2022: Pt. Is improving with bilateral wrist extension. R: 46(64) L: 0(20) 09/20/2022: limited PROM in the Left wrist extension 2/2 tightness/stiffness. 09/13/22: R: 55(68), L: 0(12) 07/19/2022: Bilateral wrist extension in limited. 07/05/2022: Right: 40(60) Left: 0(12) 04/07/2022: Right: 40(55) Left: 5(18) 02/03/2022: Right: 34(55) Left: 3(4) 12/09/2021: Right  34(55)  Left  3(4) 10/28/2021:  Right 22(38), Left 0(15) 09/14/2021:  Right: 17(35), left 2(15)  Goal status: Limited progress with wrist extension  this progress reporting period. Ongoing  7.  Pt. Will increase bilateral lateral pinch strength by 2 lbs to be able to securely grasp items  during ADLs, and IADL tasks.  Baseline: 01/19/2023: Deferred as the Pinch meter is out for calibration.12/06/2022: Pt. Is able to more securely hold objects during ADLs/IADLs 10/27/2022: Pt. Is improving with holding items with her bilateral thumbs. (Pinch meter out for calibration) 09/20/2022: lateral pinch continues to be limited 09/13/22: R 6.5# L 4# 07/19/2022: TBD 07/05/2022: TBD 07/2022: NT-Pinch meter out for calibration. 02/03/2022: Right: 6#, Left: 4# 12/09/2021: Right: 6#, Left: 4#  Goal status: Deferred   9.  Pt. will complete plant care with modified independence.  Baseline:12/06/2022: Pt. Is independent with plant care in her husband's room, however is not able to access her plants due to furniture blocking access to them. 10/27/2022: Pt. Is able to water plants that are closer, and within reach. Pt. continues to have difficulty reaching for thorough plant care. 09/20/2022: Pt. Continues to have difficulty reaching for thorough plant care. 09/13/22: continues to report intermittent difficulty 07/19/2022: Pt. Continues to be able to water, and care for some of her plants. Pt. Has more difficulty with plants that are harder to reach. 07/05/2022: pt. Is able to water, and care for some of her plants. Pt. Has more difficulty with plants that are harder to reach. 04/07/2022: Pt. is now able to hold a cup and water her plants.02/03/2022: Pt. has difficulty caring for her plants.  Goal status:Achieved   10.  Pt. will demonstrate adaptive techniques to assist with the efficiency of self-dressing, or morning care tasks.  Baseline: 01/19/2203: Pt. Is able to donn her jacket in reverse independently. Pt. requires assist from staff, as Pt. Reports staff have decreased time. 12/06/2022: Pt. requires assist from staff, as Pt. Reports staff have decreased time. 10/27/2022: Pt. is able to assist with initiating UE dressing. Pt. requires assist from personal, and staff care aides. 09/20/2022: Pt. continues to require assist  form personal, and staff care aides. 09/13/22: MODA dressing, reports she is often rushed 07/19/2022: Pt. continues to require assist with self-dressing/morning care tasks. 07/05/2022: Continue with goal. 04/07/2022: Pt. Continues to require assist with the efficiency of self-dressing, and morning care tasks.02/03/2022: Pt. requires  assist from caregivers 2/2 time limitations during morning care.  Goal status: Ongoing  11.  Pt. will improve BUE strength to improve ADL, and IADL functioning.  Baseline: 01/19/2023: L 4/5, R 3+/5 12/06/2022: Continue 10/27/2022: shoulder flexion L 4/5, R 3+/5; shoulder abduction L 4+/5, R 3+/5. elbow flexion B 5/5, elbow extension 4/5 09/20/2022: Pt. Presents with limited BUE strength 09/13/22: shoulder flexion L 4/5, R 3/5; elbow flexion B 5/5, elbow extension 4-/5; shoulder abduction L 4+/5, R 3+/5. 07/19/2022: BUE strength continues to be limited. 07/05/2022: shoulder flexion: right 4-/5, abduction: 3+/5, elbow flexion: right: 5/5, left 5/5, extension: right: 4-/5, left 4-/5, wrist extension: right: 3-/5, left: 2-/5  Goal status: Ongoing  12.  Pt. will improve bilateral thumb radial abduction in order to be able to hold the grab bars while standing with PT  Baseline: 01/19/2023: R: 38(48) L: 24(26)12/06/2022: Pt. Continues to present  with limited thumb abduction, however is improving holding onto the parallel bars.10/27/2022: thumb radial abduction: R: 30(42), L: 20(24  Goal status: Ongoing  13.  Pt. Will be able to securely hold, and stabilize medication bottles at a tabletop surface when opening, and closing them.  Baseline: 01/19/2023: Pt. Is now able to securely hold medication bottles while opening them, and stabilizes them on surfaces while opening them. 12/06/2022: Pt is improving with securely stabilizing medication bottles. 10/27/2022: Pt. Stabilizes bottles against her torso when attempting to open, and close them   Goal status: Achieved  14: Pt. Will improve bilateral  thumb IP flexion to improve active grasping patterns.    Baseline: 01/19/2023: Right 35(50), Left: 20/45)    Goal status: New    15. Pt. Will improve bilateral FMC/speed, and dexterity. As evidence by improved scores on the 9 hole peg test.   Baseline: 01/19/2023: 2 pegs placed in 1 min. & 5 sec.  Pt. was able to remove 9 pegs in 17 sec.; Left: 2 pegs placed in 1 min. & 10    sec. Pt. was able to remove 9 pegs in 20 sec.   Goal status: New      ASSESSMENT:  CLINICAL IMPRESSION:  Pt. Continues to utilize a  lateral pinch grasp to remove 100% of the resistive cubes with L and R hand and to formulate 2nd digit extension to press them back into place. Pt continues to present with difficulty flexing the 3rd, 4th, and 5th digits when isolating the 2nd digit L worse than R. Pt. presents with improving extended reach, with improved digit extension to release the cubes. Pt. continues to benefit form OT services to work on impoving ROM, and UE strength in order to work towards increasing bilateral hand grasp on objects, and increasing engagement of bilateral hands during ADLs, and IADL tasks.      PERFORMANCE DEFICITS: in functional skills including ADLs, IADLs, coordination, dexterity, ROM, strength, and UE functional use, cognitive skills including , and psychosocial skills including coping strategies and environmental adaptation.   IMPAIRMENTS: are limiting patient from ADLs, IADLs, and leisure.   CO-MORBIDITIES: may have co-morbidities  that affects occupational performance. Patient will benefit from skilled OT to address above impairments and improve overall function.  MODIFICATION OR ASSISTANCE TO COMPLETE EVALUATION: Min-Moderate modification of tasks or assist with assess necessary to complete an evaluation.  OT OCCUPATIONAL PROFILE AND HISTORY: Detailed assessment: Review of records and additional review of physical, cognitive, psychosocial history related to current functional  performance.  CLINICAL DECISION MAKING: Moderate - several treatment options, min-mod task modification necessary  REHAB POTENTIAL: Good for stated goals  PLAN:  OT FREQUENCY 2x's a week  OT DURATION: 12 weeks  PLANNED INTERVENTIONS ADL training, A/E training, UE ther. Ex, Manual therapy, neuromuscular re-education, moist heat modality, Paraffin Bath, Splinting, and  Pt./caregiver education   RECOMMENDED OTHER SERVICES: PT  CONSULTED AND AGREED WITH PLAN OF CARE: Patient  PLAN FOR NEXT SESSION: Continue Treatment as per established POC  Kathie Dike, M.S. OTR/L  03/16/23, 11:01 AM  ascom [phone]

## 2023-03-16 NOTE — Therapy (Signed)
OUTPATIENT PHYSICAL THERAPY NEURO TREATMENT  Patient Name: Sherry [name] MRN: [id number] DOB:1934/11/10, 89 y.o., female Today's Date: 03/16/2023   PCP: Sherry Mealing, MD REFERRING PROVIDER: Deeann [name]   PT End of Session - 03/16/23 1149     Visit Number 98    Number of Visits 112    Date for PT Re-Evaluation 03/23/23    Authorization Type [employer] FOTO performed by PT on eval (7/20), score 12, Progress note on 03/10/2020; PN on 08/20/2020    Authorization Time Period Initial cert [id number]/[id number]/9528; Recert 01/11/2022-04/05/2022; Recert 04/07/2022-06/30/2022    Progress Note Due on Visit 100    PT Start Time 1145    PT Stop Time 1225    PT Time Calculation (min) 40 min    Equipment Utilized During Treatment Gait belt    Activity Tolerance Patient tolerated treatment well    Behavior During Therapy WFL for tasks assessed/performed                 Past Medical History:  Diagnosis Date   Acute blood loss anemia    Arthritis    Cancer (HCC)    skin   Central cord syndrome at C6 level of cervical spinal cord (HCC) 11/29/2017   Hypertension    Protein-calorie malnutrition, severe (HCC) 01/24/2018   S/P insertion of IVC (inferior vena caval) filter 01/24/2018   Tetraparesis (HCC)    Past Surgical History:  Procedure Laterality Date   ANTERIOR CERVICAL DECOMP/DISCECTOMY FUSION N/A 11/29/2017   Procedure: Cervical five-six, six-seven Anterior Cervical Decompression Fusion;  Surgeon: Sherry Pierini, MD;  Location: [facility] OR;  Service: Neurosurgery;  Laterality: N/A;  Cervical five-six, six-seven Anterior Cervical Decompression Fusion   CATARACT EXTRACTION     FEMUR IM NAIL Left 08/16/2021   Procedure: INTRAMEDULLARY (IM) NAIL FEMORAL;  Surgeon: Sherry Saint, MD;  Location: [facility] ORS;  Service: Orthopedics;  Laterality: Left;   IR IVC FILTER PLMT / S&I /IMG GUID/MOD SED  12/08/2017   Patient Active Problem List   Diagnosis Date Noted   Age-related osteoporosis  without current pathological fracture 07/27/2022   Mild recurrent major depression (HCC) 07/27/2022   Hypertensive heart disease with other congestive heart failure (HCC) 02/14/2022   Chronic indwelling Foley catheter 02/14/2022   Closed hip fracture (HCC) 08/15/2021   DVT (deep venous thrombosis) (HCC) 08/15/2021   Quadriplegia (HCC) 08/15/2021   Fall 08/15/2021   Constipation due to slow transit 08/31/2018   Trauma 06/05/2018   Neuropathic pain 06/05/2018   Neurogenic bowel 06/05/2018   Vaginal yeast infection 01/30/2018   Healthcare-associated pneumonia 01/25/2018   Chronic allergic rhinitis 01/24/2018   Depression with anxiety 01/24/2018   UTI due to Klebsiella species 01/24/2018   Protein-calorie malnutrition, severe (HCC) 01/24/2018   S/P insertion of IVC (inferior vena caval) filter 01/24/2018   Tetraparesis (HCC) 01/20/2018   Neurogenic bladder 01/20/2018   Reactive depression    Benign essential HTN    Acute postoperative anemia due to expected blood loss    Central cord syndrome at C6 level of cervical spinal cord (HCC) 11/29/2017   Allergy to alpha-gal 11/25/2016   SCC (squamous cell carcinoma) 04/08/2014    ONSET DATE: 08/15/2021 (fall with B hip fx); Initial injury was in 2019- quadraplegia due to central cord syndrome of C6 secondary to neck fracture.   REFERRING DIAG: Bilateral Hip fx; Left Open- s/p Left IM nail femoral on 08/16/2021; Right closed  THERAPY DIAG:  Muscle weakness (generalized)  Other lack of coordination  Central  cord syndrome, sequela (HCC)  Other abnormalities of gait and mobility  Difficulty in walking, not elsewhere classified  Closed fracture of both hips, initial encounter (HCC)  Abnormality of gait and mobility  Rationale for Evaluation and Treatment Rehabilitation  SUBJECTIVE:   SUBJECTIVE STATEMENT:   Patient reports she feels her current AFO is "cutting off her circulation in her foot." She presents today without a brace.       Pt accompanied by:  Sherry [name]-   PERTINENT HISTORY: Sherry [name] is an 87yoF who experienced a fall at home on 08/15/2021 with hip fracture, s/p Left hip ORIF. PMH: quadriplegia due to central cord syndrome of C6 secondary to neck fracture, HTN, depression with anxiety, neurogenic bladder, bilateral DVT on Xarelto (s/p of IVC filter placement). Prior history of significant PT/OT with improvement of function. Pt has full use of shoulder/elbows, limited use of hands, adaptive self feeding sucessful.     PAIN:  None current but does endorse some left thigh pain intermittent  OBJECTIVE:    TODAY'S TREATMENT:  03/16/2023     THERAPEUTIC ACTIVITIES  -Sit to stand from w/c in // bars x 2 trials with ankle stabilization brace on right and frame of AFO on left - Static stand attempted yet left brace unable to hold left foot into proper DF and also increased pressure on left toes.   -Active assisted reclined heel slide - allowing patient to perform in more ROM (gravity more minimized) -2 x 15 each LE -Seated LAQ 2 x 15 each LE with Terminal knee ext (static hold x 3 sec) to simulate blocking knees  -Long sit Quad set- hold 3 sec x 15 reps each to simulate terminal knee ext with standing.  - Reclined SLR- (AAROM on RLE)  and Active on left LE - 2 sets of 15 reps.      Therex:   Hip add with ball- hold 5 sec - 2 x 10 reps    - Manual resistance Leg press (long sitting) - x 15 reps- ea LE  PATIENT EDUCATION: Education details: Exercise technique, Discussion of how to obtain order/F2F for a new brace Patient verbalized understanding.   HOME EXERCISE PROGRAM: No changes at this time  Access Code: [facility] URL: [email or web address] Date: 11/23/2021 Prepared by: Sherry [name] Exercises - Seated Gluteal Sets - 2 x daily - 7 x weekly - 2 sets - 10 reps - 5 hold - Seated Quad Set - 2 x daily - 7 x weekly - 2 sets - 10 reps - 5 hold - Seated Long Arc Quad - 2 x daily - 7 x  weekly - 2 sets - 10 reps - 5 hold - Seated March - 2 x daily - 7 x weekly - 2 sets - 10 reps - 5 hold - Seated Hip Abduction - 2 x daily - 7 x weekly - 2 sets - 10 reps - 5 hold - Seated Shoulder Shrugs - 2 x daily - 7 x weekly - 2 sets - 10 reps - 5 hold - Wheelchair Pressure Relief - 2 x daily - 7 x weekly - 2 sets - 10 reps - 5 hold  GOALS: Goals reviewed with patient? Yes  SHORT TERM GOALS: Target date: 02/22/2022  Pt will be independent with initial UE strengthening HEP in order to improve strength and balance in order to decrease fall risk and improve function at home and work. Baseline: 10/19/2021- patient with no formal UE HEP; 12/02/2021= Patient verbalized knowledge of HEP including  use of theraband for UE strengthening.  Goal status: GOAL MET  LONG TERM GOALS: Target date: 03/23/2023  Pt will be independent with final for UE/LE HEP in order to improve strength and balance in order to decrease fall risk and improve function at home and work. Baseline: Patient is currently BLE NWB and unable to participate in HEP. Has order for UE strengthening. 01/11/2022- Patient now able to participate in LE strengthening although NWB still- good understanding for some basic exercises. Will keep goal active to incorporate progressive LE strengthening exercises. 04/07/2022= Patient still participating in progressive LE seated HEP and has no questions at this time.  Goal status: MET  2.  Pt will improve FOTO to target score of 35  to display perceived improvements in ability to complete ADL's.  Baseline: 10/19/2021= 12; 12/02/2021= 17; 01/11/2022=17; 04/07/2022= 17; 07/05/2022=17; 10/06/2022- outcome measure not appropriate as patient in non-ambulatory and not consistently standing. Goal status: Goal not appropriate  3.  Pt will increase strength of B UE  by at least 1/2 MMT grade in order to demonstrate improvement in strength and function  Baseline: patient has range of 2-/5 to 4/5 BUE Strength; 12/02/2021= 4/5  except with wrist ext. 01/11/2022= 4/5 B UE strength expept for wrist Goal status: Goal revised-no longer appropriate- working with OT on all UE strengthening.   4. Pt. Will increase strength of RLE by at least 1/2 MMT grade in order to demonstrate improvement in Standing/transfers.  Baseline: 2-/5 Right hip flex/knee ext/flex; 04/07/2022= 2- with right hip flex/knee ext (lacking 28 deg from zero) 4/8= Patient able to ext right knee lacking 18 deg from zero. 07/05/2022=Left knee approx 8 deg from zero and right knee = 23 deg from zero; 12/29/2022 - Left LE = full ROM 4-/5 and right knee ext= 23 deg from zero= 2+/5 (gravity minimized)   Goal status: Progressing  5. Pt. Will demo ability to stand pivot transfer with max assist for improved functional mobility and less dependent need on mechanical device.   Baseline: Dependent on hoyer lift for all transfers; 04/07/2022- Unable to test secondary to patient with recent UTI and right hand procedure - will attempt to reassess next visit. ; 04/12/22: Unable to successfully stand due to feet plates on loaned power w/c; 05/10/2022- Patient still having to use rental chair and has not received her original power w/c back to practice SPT. 07/04/2021= Patient just received new power w/c and attempted standing from 1st time today- Patient able to stand with max assist today from w/c and did not attempt to perform pivot transfer- difficulty with static standing and placing weight on right LE. 10/06/2022- Not assessed today- patient needs new AFO prior to attempting transfers; 12/29/2022= Patient has not attempted SPT yet - still trying to improve LE strength enough to stand well without knee buckling with weight shifting.   Goal status: ONGOING  6. Pt. Will demonstrate improved functional LE strength as seen by ability to stand > 2 min for improved transfer ability and pregait abilities.   Baseline: not assessed today due to recent dx: UTI; 04/07/2022- Unable to test secondary to  patient with recent UTI and right hand procedure - will attempt to reassess next visit; 04/12/22: Unable to successfully stand due to feet plates on loaned power w/c 05/10/2022- Patient still having to use rental chair and has not received her original power w/c back to practice Standing. 07/05/2022- Patient able to stand with max assist at trunk today for 2 min 3 sec. Will  keep goal active to ensure consistency. 10/06/2022= Unable to assess today- patient with some recent blood pressure issues and needs new AFO. 11/24/2022= Patient demonstrated multiple rounds of standing in // bars- holding up to 1:45 min today but has been able to exhibit 2 min in recent past- working on consistency. 12/29/2022= 2 min 40 sec with patient demonstrating improved overall posture and ability to contract glutes and quads with just min assist at trunk today.   Goal status: MET  7. Pt. Will demonstrate improved functional LE strength as seen by ability to stand > 5 min for improved transfer ability and pregait abilities. Baseline: 12/29/2022= 2 min 40 sec with patient demonstrating improved overall posture and ability to contract glutes and quads with just min assist at trunk today.  Goal Status: NEW  8. Patient will perform sit to stand transfer with moderate assist consistently > 75% of time for improved transfer ability and less dependence on [name].   Baseline: 12/29/2022- Patient currently max assist level assistance with all sit to stand activities.   Goal status: New   ASSESSMENT:  CLINICAL IMPRESSION:   Treatment was again modified as patient presented with brace without inner lining. Attempted standing yet brace did not hold foot well and increased pressure toes. Otherwise patient continues to work on her functional LE strength performing several reclined and seated exercises to promote improved LE coordination and strength. She performed well with less overall physical assistance. Pt will continue to benefit from  skilled PT services to optimize independence and reduced [name] support.    OBJECTIVE IMPAIRMENTS Abnormal gait, decreased activity tolerance, decreased balance, decreased coordination, decreased endurance, decreased mobility, difficulty walking, decreased ROM, decreased strength, hypomobility, impaired flexibility, impaired UE functional use, postural dysfunction, and pain.   ACTIVITY LIMITATIONS carrying, lifting, bending, sitting, standing, squatting, sleeping, stairs, transfers, bed mobility, continence, bathing, toileting, dressing, self feeding, reach over head, hygiene/grooming, and caring for others  PARTICIPATION LIMITATIONS: meal prep, cleaning, laundry, medication management, personal finances, interpersonal relationship, driving, shopping, community activity, and yard work  PERSONAL FACTORS Age, Time since onset of injury/illness/exacerbation, and 1-2 comorbidities: HTN, cervical Sx  are also affecting patient's functional outcome.   REHAB POTENTIAL: Good  CLINICAL DECISION MAKING: Evolving/moderate complexity  EVALUATION COMPLEXITY: Moderate  PLAN: PT FREQUENCY: 1-2x/week  PT DURATION: 12 weeks  PLANNED INTERVENTIONS: Therapeutic exercises, Therapeutic activity, Neuromuscular re-education, Balance training, Gait training, Patient/Family education, Self Care, Joint mobilization, DME instructions, Dry Needling, Electrical stimulation, Wheelchair mobility training, Spinal mobilization, Cryotherapy, Moist heat, and Manual therapy  PLAN FOR NEXT SESSION:  LE and core strength training, Sit to stand and static standing; progress to SPT as able.     1:16 PM, 03/16/23 Louis Meckel, PT Physical Therapist - [facility]  Outpatient Physical Therapy- Main Campus [phone]

## 2023-03-18 ENCOUNTER — Other Ambulatory Visit: Payer: Self-pay | Admitting: Student

## 2023-03-18 NOTE — Addendum Note (Signed)
Addended by: Earnestine Mealing on: 03/18/2023 10:06 AM   Modules accepted: Orders

## 2023-03-21 ENCOUNTER — Ambulatory Visit: Payer: Medicare HMO | Admitting: Occupational Therapy

## 2023-03-21 ENCOUNTER — Ambulatory Visit: Payer: Medicare HMO

## 2023-03-21 DIAGNOSIS — M6281 Muscle weakness (generalized): Secondary | ICD-10-CM | POA: Diagnosis not present

## 2023-03-21 DIAGNOSIS — R278 Other lack of coordination: Secondary | ICD-10-CM

## 2023-03-21 DIAGNOSIS — R2681 Unsteadiness on feet: Secondary | ICD-10-CM

## 2023-03-21 DIAGNOSIS — R262 Difficulty in walking, not elsewhere classified: Secondary | ICD-10-CM

## 2023-03-21 DIAGNOSIS — R296 Repeated falls: Secondary | ICD-10-CM

## 2023-03-21 DIAGNOSIS — S72002A Fracture of unspecified part of neck of left femur, initial encounter for closed fracture: Secondary | ICD-10-CM

## 2023-03-21 DIAGNOSIS — R269 Unspecified abnormalities of gait and mobility: Secondary | ICD-10-CM

## 2023-03-21 DIAGNOSIS — R2689 Other abnormalities of gait and mobility: Secondary | ICD-10-CM

## 2023-03-21 DIAGNOSIS — S14129S Central cord syndrome at unspecified level of cervical spinal cord, sequela: Secondary | ICD-10-CM

## 2023-03-21 NOTE — Therapy (Signed)
OUTPATIENT PHYSICAL THERAPY NEURO TREATMENT  Patient Name: Sherry [name] MRN: [id number] DOB:1934-07-17, 89 y.o., female Today's Date: 03/21/2023   PCP: Earnestine Mealing, MD REFERRING PROVIDER: Deeann Saint   PT End of Session - 03/21/23 1155     Visit Number 99    Number of Visits 112    Date for PT Re-Evaluation 03/23/23    Authorization Type [employer] FOTO performed by PT on eval (7/20), score 12, Progress note on 03/10/2020; PN on 08/20/2020    Authorization Time Period Initial cert 05/10/8117-14/78/2956; Recert 01/11/2022-04/05/2022; Recert 04/07/2022-06/30/2022    Progress Note Due on Visit 100    PT Start Time 1147    PT Stop Time 1228    PT Time Calculation (min) 41 min    Equipment Utilized During Treatment Gait belt    Activity Tolerance Patient tolerated treatment well    Behavior During Therapy WFL for tasks assessed/performed                  Past Medical History:  Diagnosis Date   Acute blood loss anemia    Arthritis    Cancer (HCC)    skin   Central cord syndrome at C6 level of cervical spinal cord (HCC) 11/29/2017   Hypertension    Protein-calorie malnutrition, severe (HCC) 01/24/2018   S/P insertion of IVC (inferior vena caval) filter 01/24/2018   Tetraparesis (HCC)    Past Surgical History:  Procedure Laterality Date   ANTERIOR CERVICAL DECOMP/DISCECTOMY FUSION N/A 11/29/2017   Procedure: Cervical five-six, six-seven Anterior Cervical Decompression Fusion;  Surgeon: Jadene Pierini, MD;  Location: [facility] OR;  Service: Neurosurgery;  Laterality: N/A;  Cervical five-six, six-seven Anterior Cervical Decompression Fusion   CATARACT EXTRACTION     FEMUR IM NAIL Left 08/16/2021   Procedure: INTRAMEDULLARY (IM) NAIL FEMORAL;  Surgeon: Deeann Saint, MD;  Location: [facility] ORS;  Service: Orthopedics;  Laterality: Left;   IR IVC FILTER PLMT / S&I /IMG GUID/MOD SED  12/08/2017   Patient Active Problem List   Diagnosis Date Noted   Age-related  osteoporosis without current pathological fracture 07/27/2022   Mild recurrent major depression (HCC) 07/27/2022   Hypertensive heart disease with other congestive heart failure (HCC) 02/14/2022   Chronic indwelling Foley catheter 02/14/2022   Closed hip fracture (HCC) 08/15/2021   DVT (deep venous thrombosis) (HCC) 08/15/2021   Quadriplegia (HCC) 08/15/2021   Fall 08/15/2021   Constipation due to slow transit 08/31/2018   Trauma 06/05/2018   Neuropathic pain 06/05/2018   Neurogenic bowel 06/05/2018   Vaginal yeast infection 01/30/2018   Healthcare-associated pneumonia 01/25/2018   Chronic allergic rhinitis 01/24/2018   Depression with anxiety 01/24/2018   UTI due to Klebsiella species 01/24/2018   Protein-calorie malnutrition, severe (HCC) 01/24/2018   S/P insertion of IVC (inferior vena caval) filter 01/24/2018   Tetraparesis (HCC) 01/20/2018   Neurogenic bladder 01/20/2018   Reactive depression    Benign essential HTN    Acute postoperative anemia due to expected blood loss    Central cord syndrome at C6 level of cervical spinal cord (HCC) 11/29/2017   Allergy to alpha-gal 11/25/2016   SCC (squamous cell carcinoma) 04/08/2014    ONSET DATE: 08/15/2021 (fall with B hip fx); Initial injury was in 2019- quadraplegia due to central cord syndrome of C6 secondary to neck fracture.   REFERRING DIAG: Bilateral Hip fx; Left Open- s/p Left IM nail femoral on 08/16/2021; Right closed  THERAPY DIAG:  Muscle weakness (generalized)  Other lack of coordination  Central cord syndrome, sequela (HCC)  Other abnormalities of gait and mobility  Difficulty in walking, not elsewhere classified  Closed fracture of both hips, initial encounter (HCC)  Abnormality of gait and mobility  Repeated falls  Unsteadiness on feet  Rationale for Evaluation and Treatment Rehabilitation  SUBJECTIVE:   SUBJECTIVE STATEMENT:   Patient reports she thinks the MD has signed the order and now waiting  on [employer] to call to obtain a new AFO.  States otherwise doing okay with no falls and no reported pain. States compliant with performing some daily LE strengthening as able.     Pt accompanied by:  Paid caregiver-   PERTINENT HISTORY: Sherry [name] is an 87yoF who experienced a fall at home on 08/15/2021 with hip fracture, s/p Left hip ORIF. PMH: quadriplegia due to central cord syndrome of C6 secondary to neck fracture, HTN, depression with anxiety, neurogenic bladder, bilateral DVT on Xarelto (s/p of IVC filter placement). Prior history of significant PT/OT with improvement of function. Pt has full use of shoulder/elbows, limited use of hands, adaptive self feeding sucessful.     PAIN:  None current but does endorse some left thigh pain intermittent  OBJECTIVE:    TODAY'S TREATMENT:  03/21/2023     THERAPEUTIC ACTIVITIES  -Active assisted reclined heel slide - allowing patient to perform in more ROM (gravity more minimized) -2 x 15 each LE -Seated LAQ -Pyramid  style- Left LE x 12 without weight, 2# AW x 12, 4# AW x 10, 5# AW x 9; RLE- 1# 4 sets of 8-10 reps -Long sit Quad set- hold 3 sec x 15 reps each to simulate terminal knee ext with standing.  - Reclined SLR- (AAROM on RLE)  and Active on left LE - 2 sets of 15 reps.   -Manual light Resistive ham curl x 12 reps  - Reclined Hip abd/add (left LE with 4# and right without ankle weight) 2 sets x 15 reps.   PATIENT EDUCATION: Education details: Exercise technique, Discussion of how to obtain order/F2F for a new brace Patient verbalized understanding.   HOME EXERCISE PROGRAM: No changes at this time  Access Code: [facility] URL: [email or web address] Date: 11/23/2021 Prepared by: Precious Bard Exercises - Seated Gluteal Sets - 2 x daily - 7 x weekly - 2 sets - 10 reps - 5 hold - Seated Quad Set - 2 x daily - 7 x weekly - 2 sets - 10 reps - 5 hold - Seated Long Arc Quad - 2 x daily - 7 x weekly - 2 sets - 10 reps - 5 hold  - Seated March - 2 x daily - 7 x weekly - 2 sets - 10 reps - 5 hold - Seated Hip Abduction - 2 x daily - 7 x weekly - 2 sets - 10 reps - 5 hold - Seated Shoulder Shrugs - 2 x daily - 7 x weekly - 2 sets - 10 reps - 5 hold - Wheelchair Pressure Relief - 2 x daily - 7 x weekly - 2 sets - 10 reps - 5 hold  GOALS: Goals reviewed with patient? Yes  SHORT TERM GOALS: Target date: 02/22/2022  Pt will be independent with initial UE strengthening HEP in order to improve strength and balance in order to decrease fall risk and improve function at home and work. Baseline: 10/19/2021- patient with no formal UE HEP; 12/02/2021= Patient verbalized knowledge of HEP including use of theraband for UE strengthening.  Goal status: GOAL MET  LONG TERM  GOALS: Target date: 03/23/2023  Pt will be independent with final for UE/LE HEP in order to improve strength and balance in order to decrease fall risk and improve function at home and work. Baseline: Patient is currently BLE NWB and unable to participate in HEP. Has order for UE strengthening. 01/11/2022- Patient now able to participate in LE strengthening although NWB still- good understanding for some basic exercises. Will keep goal active to incorporate progressive LE strengthening exercises. 04/07/2022= Patient still participating in progressive LE seated HEP and has no questions at this time.  Goal status: MET  2.  Pt will improve FOTO to target score of 35  to display perceived improvements in ability to complete ADL's.  Baseline: 10/19/2021= 12; 12/02/2021= 17; 01/11/2022=17; 04/07/2022= 17; 07/05/2022=17; 10/06/2022- outcome measure not appropriate as patient in non-ambulatory and not consistently standing. Goal status: Goal not appropriate  3.  Pt will increase strength of B UE  by at least 1/2 MMT grade in order to demonstrate improvement in strength and function  Baseline: patient has range of 2-/5 to 4/5 BUE Strength; 12/02/2021= 4/5 except with wrist ext. 01/11/2022=  4/5 B UE strength expept for wrist Goal status: Goal revised-no longer appropriate- working with OT on all UE strengthening.   4. Pt. Will increase strength of RLE by at least 1/2 MMT grade in order to demonstrate improvement in Standing/transfers.  Baseline: 2-/5 Right hip flex/knee ext/flex; 04/07/2022= 2- with right hip flex/knee ext (lacking 28 deg from zero) 4/8= Patient able to ext right knee lacking 18 deg from zero. 07/05/2022=Left knee approx 8 deg from zero and right knee = 23 deg from zero; 12/29/2022 - Left LE = full ROM 4-/5 and right knee ext= 23 deg from zero= 2+/5 (gravity minimized)   Goal status: Progressing  5. Pt. Will demo ability to stand pivot transfer with max assist for improved functional mobility and less dependent need on mechanical device.   Baseline: Dependent on hoyer lift for all transfers; 04/07/2022- Unable to test secondary to patient with recent UTI and right hand procedure - will attempt to reassess next visit. ; 04/12/22: Unable to successfully stand due to feet plates on loaned power w/c; 05/10/2022- Patient still having to use rental chair and has not received her original power w/c back to practice SPT. 07/04/2021= Patient just received new power w/c and attempted standing from 1st time today- Patient able to stand with max assist today from w/c and did not attempt to perform pivot transfer- difficulty with static standing and placing weight on right LE. 10/06/2022- Not assessed today- patient needs new AFO prior to attempting transfers; 12/29/2022= Patient has not attempted SPT yet - still trying to improve LE strength enough to stand well without knee buckling with weight shifting.   Goal status: ONGOING  6. Pt. Will demonstrate improved functional LE strength as seen by ability to stand > 2 min for improved transfer ability and pregait abilities.   Baseline: not assessed today due to recent dx: UTI; 04/07/2022- Unable to test secondary to patient with recent UTI and right  hand procedure - will attempt to reassess next visit; 04/12/22: Unable to successfully stand due to feet plates on loaned power w/c 05/10/2022- Patient still having to use rental chair and has not received her original power w/c back to practice Standing. 07/05/2022- Patient able to stand with max assist at trunk today for 2 min 3 sec. Will keep goal active to ensure consistency. 10/06/2022= Unable to assess today- patient with some  recent blood pressure issues and needs new AFO. 11/24/2022= Patient demonstrated multiple rounds of standing in // bars- holding up to 1:45 min today but has been able to exhibit 2 min in recent past- working on consistency. 12/29/2022= 2 min 40 sec with patient demonstrating improved overall posture and ability to contract glutes and quads with just min assist at trunk today.   Goal status: MET  7. Pt. Will demonstrate improved functional LE strength as seen by ability to stand > 5 min for improved transfer ability and pregait abilities. Baseline: 12/29/2022= 2 min 40 sec with patient demonstrating improved overall posture and ability to contract glutes and quads with just min assist at trunk today.  Goal Status: NEW  8. Patient will perform sit to stand transfer with moderate assist consistently > 75% of time for improved transfer ability and less dependence on caregiver.   Baseline: 12/29/2022- Patient currently max assist level assistance with all sit to stand activities.   Goal status: New   ASSESSMENT:  CLINICAL IMPRESSION:   Patient presented without lining for AFO so unable to work on transfers or standing. She did however respond well to progressive LE strengthening- exhibiting be able to perform with increased overall resistance and even some resistance on right LE today. She was fatigued with strengthening activities today but able to perform with less overall assist.  Pt will continue to benefit from skilled PT services to optimize independence and reduced caregiver  support.    OBJECTIVE IMPAIRMENTS Abnormal gait, decreased activity tolerance, decreased balance, decreased coordination, decreased endurance, decreased mobility, difficulty walking, decreased ROM, decreased strength, hypomobility, impaired flexibility, impaired UE functional use, postural dysfunction, and pain.   ACTIVITY LIMITATIONS carrying, lifting, bending, sitting, standing, squatting, sleeping, stairs, transfers, bed mobility, continence, bathing, toileting, dressing, self feeding, reach over head, hygiene/grooming, and caring for others  PARTICIPATION LIMITATIONS: meal prep, cleaning, laundry, medication management, personal finances, interpersonal relationship, driving, shopping, community activity, and yard work  PERSONAL FACTORS Age, Time since onset of injury/illness/exacerbation, and 1-2 comorbidities: HTN, cervical Sx  are also affecting patient's functional outcome.   REHAB POTENTIAL: Good  CLINICAL DECISION MAKING: Evolving/moderate complexity  EVALUATION COMPLEXITY: Moderate  PLAN: PT FREQUENCY: 1-2x/week  PT DURATION: 12 weeks  PLANNED INTERVENTIONS: Therapeutic exercises, Therapeutic activity, Neuromuscular re-education, Balance training, Gait training, Patient/Family education, Self Care, Joint mobilization, DME instructions, Dry Needling, Electrical stimulation, Wheelchair mobility training, Spinal mobilization, Cryotherapy, Moist heat, and Manual therapy  PLAN FOR NEXT SESSION:  LE and core strength training, Sit to stand and static standing; progress to SPT as able. RECERT VISIT NEXT VISIT.    3:51 PM, 03/21/23 Louis Meckel, PT Physical Therapist - [facility]  Outpatient Physical Therapy- Main Campus [phone]

## 2023-03-21 NOTE — Therapy (Signed)
Occupational Therapy Neuro Treatment Note        Patient Name: Sherry [name] MRN: [id number] DOB:1934/12/30, 89 y.o., female Today's Date: 11/29/2022  PCP:  Cindi Carbon PROVIDER: Earnestine Mealing, MD  END OF SESSION:   OT End of Session - 03/21/23 1607     Visit Number [id number]    Number of Visits 168    Date for OT Re-Evaluation 04/13/23    OT Start Time 1100    OT Stop Time 1145    OT Time Calculation (min) 45 min    Activity Tolerance Patient tolerated treatment well    Behavior During Therapy [facility] for tasks assessed/performed                 Past Medical History:  Diagnosis Date   Acute blood loss anemia    Arthritis    Cancer (HCC)    skin   Central cord syndrome at C6 level of cervical spinal cord (HCC) 11/29/2017   Hypertension    Protein-calorie malnutrition, severe (HCC) 01/24/2018   S/P insertion of IVC (inferior vena caval) filter 01/24/2018   Tetraparesis (HCC)    Past Surgical History:  Procedure Laterality Date   ANTERIOR CERVICAL DECOMP/DISCECTOMY FUSION N/A 11/29/2017   Procedure: Cervical five-six, six-seven Anterior Cervical Decompression Fusion;  Surgeon: Jadene Pierini, MD;  Location: [facility] OR;  Service: Neurosurgery;  Laterality: N/A;  Cervical five-six, six-seven Anterior Cervical Decompression Fusion   CATARACT EXTRACTION     FEMUR IM NAIL Left 08/16/2021   Procedure: INTRAMEDULLARY (IM) NAIL FEMORAL;  Surgeon: Deeann Saint, MD;  Location: [facility] ORS;  Service: Orthopedics;  Laterality: Left;   IR IVC FILTER PLMT / S&I /IMG GUID/MOD SED  12/08/2017   Patient Active Problem List   Diagnosis Date Noted   Age-related osteoporosis without current pathological fracture 07/27/2022   Mild recurrent major depression (HCC) 07/27/2022   Hypertensive heart disease with other congestive heart failure (HCC) 02/14/2022   Chronic indwelling Foley catheter 02/14/2022   Closed hip fracture (HCC) 08/15/2021   DVT (deep venous thrombosis) (HCC)  08/15/2021   Quadriplegia (HCC) 08/15/2021   Fall 08/15/2021   Constipation due to slow transit 08/31/2018   Trauma 06/05/2018   Neuropathic pain 06/05/2018   Neurogenic bowel 06/05/2018   Vaginal yeast infection 01/30/2018   Healthcare-associated pneumonia 01/25/2018   Chronic allergic rhinitis 01/24/2018   Depression with anxiety 01/24/2018   UTI due to Klebsiella species 01/24/2018   Protein-calorie malnutrition, severe (HCC) 01/24/2018   S/P insertion of IVC (inferior vena caval) filter 01/24/2018   Tetraparesis (HCC) 01/20/2018   Neurogenic bladder 01/20/2018   Reactive depression    Benign essential HTN    Acute postoperative anemia due to expected blood loss    Central cord syndrome at C6 level of cervical spinal cord (HCC) 11/29/2017   Allergy to alpha-gal 11/25/2016   SCC (squamous cell carcinoma) 04/08/2014   ONSET DATE: 11/29/2017  REFERRING DIAG: Central Cord Syndrome at C6 of the Cervical Spinal Cord, Fall with Bilateral Closed Hip Fractures, with ORIF repair with Intramedullary Nailing of the Left Hip Fracture.   THERAPY DIAG:  Muscle weakness (generalized)  Other lack of coordination  Rationale for Evaluation and Treatment: Rehabilitation  SUBJECTIVE:   SUBJECTIVE STATEMENT:  Pt. reports doing well today  Pt accompanied by: self, Personal Care aide  PERTINENT HISTORY: Central Cord Syndrome at C6 of the Cervical Spinal Cord, Fall with Bilateral Closed Hip Fractures, with ORIF repair with Intramedullary Nailing of the Left Hip Fracture.  PRECAUTIONS: None  WEIGHT BEARING RESTRICTIONS: No  PAIN:  Are you having pain? No pain, just shoulder stiffness   LIVING ENVIRONMENT: Lives with: [facility]  Stairs: No Has following equipment at home: Wheelchair (power)  PLOF: Independent   PATIENT GOALS:  See below for established goals  OBJECTIVE:  Note: Objective measures were completed at Evaluation unless otherwise noted.  HAND  DOMINANCE: Right  ADLs: Caregiver assist with ADLs, and [facility]  MOBILITY STATUS: Uses a power w/c  ACTIVITY TOLERANCE: Activity tolerance: Fair  FUNCTIONAL OUTCOME MEASURES:  Measurements:   12/09/2021:   Shoulder flexion: R: 120(136), L: 138(150) Shoulder abduction: R: 108(120), L: 110(120) Elbow: R: 0-148, L: 0-146 Wrist flexion: R: 55(62), L: 78 Writ extension: R: 34(55), L: 3(4) RD: R: 12(20), L: 16(26) UD: R: 8(24), L: 6(18) Thumb radial abduction: R: 11(20), L: 8(20) Digit flexion to the [facility]: R:  2nd: 5cm(3.5cm), 3rd: 7.5 cm(5cm), 4th: 7cm(3cm), 5th: 6cm(5.5cm) L: 2nd: 8cm(8cm), 3rd: 8cm(8cm), 4th: 7.5cm(7cm), 5th: 7cm(7cm)   02/03/2022:   Shoulder flexion: R: 122(138), L: 148(154) Shoulder abduction: R: 109(125), L: 125(125 Elbow: R: 0-148, L: 0-146 Wrist flexion: R: 60(68), L: 79 Writ extension: R: 40(55), L: 3(10) RD: R: 14(24), L: 20(28) UD: R: 8(24), L: 6(18) Thumb radial abduction: R: 20(25), L: 8(20) Digit flexion to the [facility]: R:  2nd: 4.5cm(3cm), 3rd: 6.5cm(4.5cm), 4th: 6.5 cm(3cm), 5th: 4.5cm(5cm) L: 2nd: 8cm(8cm), 3rd: 8cm(7cm), 4th: 7.5cm(7cm), 5th: 6.5cm(6.5cm)   04/07/2022:   Shoulder flexion: R: 125(150), L: 160(160) Shoulder abduction: R: 109(125), L: 125(125 Elbow: R: 0-148, L: 0-146 Wrist flexion: R: 60(68), L: 79 Writ extension: R: 40(55), L: 5(18) RD: R: 14(24), L: 20(28) UD: R: 18(24), L: 8(18) Thumb radial abduction: R: 20(25), L: 8(20) Digit flexion to the [facility]: R:  2nd: 4.5 cm(4 cm), 3rd: 6.5cm(3cm), 4th: 7 cm (5cm), 5th: 5cm(5cm) L: 2nd: 8cm(8cm), 3rd: 8cm(7cm), 4th: 7.5cm(7cm), 5th: 7cm(7cm)   07/05/2022:   Shoulder flexion: R: 125(154), L: 160(160) Shoulder abduction: R: 95(130), L: 143(143) Elbow: R: 0-148, L: 0-146 Wrist flexion: R: 60(68), L: 79 Writ extension: R: 40(60), L: 0(12) RD: R: 10(24), L: 12(20) UD: R: 16(24), L: 8(16) Thumb radial abduction: R: 20(25), L: 8(20) Digit flexion to the [facility]: R:  2nd: 4  cm(4cm), 3rd: 6.5cm( 3.5cm), 4th: 7 cm (5cm), 5th: 6cm(5cm) L: 2nd: 8cm(8cm), 3rd: 8cm(7cm), 4th: 8cm(7cm), 5th: 7cm(7cm)   09/13/2022:   Shoulder flexion: R: 125(154), L: 160(160) Shoulder abduction: R: 100(130), L: 143(143) Elbow: R: 0-150, L: 0-150 Wrist flexion: R: 55(68), L: 79 Writ extension: R: 45(60), L: 0(12) RD: R: 10(24), L: 12(20) UD: R: 16(24), L: 8(16) Thumb radial abduction: R: 20(25), L: 8(20) Digit flexion to the [facility]: R:  2nd: 4 cm(4cm), 3rd: 6cm( 5cm), 4th: 6 cm (4cm), 5th: 5cm(5cm) L: 2nd: 7cm(cm), 3rd: 7cm(6cm), 4th: 7cm(6cm), 5th: 6cm(6cm)   10/27/2022   Shoulder flexion: R: 128(154), L: 160(160) Shoulder abduction: R: 105(130), L: 143(143) Elbow: R: 0-150, L: 0-150 Wrist flexion: R: 55(68), L: 79 Writ extension: R: 46(64), L: 0(20) RD: R: 10(20), L: 18(24) UD: R: 118(28), L: 8(20) Thumb radial abduction: R: 30(42), L:20(24) Digit flexion to the [facility]: R:  2nd: 4 cm(3cm), 3rd: 6cm( 6cm), 4th: 6 cm (5cm), 5th: 5cm(5cm) L: 2nd: 8cm(8cm), 3rd: 8cm(6cm), 4th: 8cm(7cm), 5th: 6cm(6cm)  01/19/2023  Shoulder flexion: R: 131(154), L: 160(160) Shoulder abduction: R: 110(130), L: 143(143) Elbow: R: 0-150, L: 0-150 Wrist flexion: R: 55(68), L: 79 Writ extension: R: 46(64), L: 0(20)  RD: R: 10(20), L: 18(24) UD: R: 118(28), L: 14(20) Thumb radial abduction: R: 38(48), L:24(26) Thumb IP flexion: R: 35(50) L: 20(45) Digit flexion to the [facility]: R:  2nd: 3.5 cm(3cm), 3rd: 6cm( 6cm), 4th: 6 cm (5cm), 5th: 5cm(4.5cm) L: 2nd: 8cm(7.5cm), 3rd: 8cm(6cm), 4th: 7.5cm(6cm), 5th: 6cm(4.5cm)  COORDINATION:   01/19/2023:  9 hole peg test:  Right: 2 pegs placed in 1 min. & 5 sec.  Pt. was able to remove 9 pegs in 17 sec.  Left: 2 pegs placed in 1 min. & 10 sec. Pt. was able to remove 9 pegs in 20 sec.  SENSATION: Intact  EDEMA: N/A   COGNITION: Overall cognitive status: WNL   VISION: Subjective report: Wears glasses, No changes in vision.  PERCEPTION:  Intact  TODAY'S TREATMENT:                                                                                                                              DATE:  03/21/23   Manual Therapy:   Pt. tolerated soft tissue massage to the volar, and dorsal surface of each digit on the bilateral hands, digit lateral, and sagittal bands 2/2 to stiffness following moist heat modality. Soft tissue mobilization was performed for carpal, and metacarpal spread stretches. Manual therapy was performed independent of, and in preparation for therapeutic Ex.     Therapeutic Ex.    Pt. tolerated PROM followed by AROM bilateral wrist extension, PROM for bilateral digit MP, PIP, and DIP flexion, and extension, thumb abduction. Pt. worked on [employer], and reciprocal motion using the UBE while seated for 5 min. with no resistance. Pt. performed 2# dowel ex. For UE strengthening secondary to weakness. Bilateral shoulder flexion, chest press, circular patterns, and elbow flexion/extension were performed.     PATIENT EDUCATION:  Education details: Bilateral hand function, ROM, and strengthening, reaching, and [facility]  Person educated: Patient Education method: Explanation, Demonstration, and Verbal cues Education comprehension: verbalized understanding and returned demonstration  HOME EXERCISE PROGRAM: Continue to assess HEP needs, and provide as needed.   GOALS: Goals reviewed with patient? Yes  LONG TERM GOALS: Target date: 04/13/2023  3.  Patient will demonstrate improved composite finger flexion to be able to firmly hold and use adaptive devices during ADLs, and IADLs.  Baseline: 01/19/2023: Pt. Is improving with Bilateral digit flexion to the [facility]. Pt. has difficulty firmly holding adaptive devices. 12/06/2022: Pt. continues to present with increased MP, PIP, and DIP digit tightness/stiffness limiting the formulation of bilateral composites fists. 10/27/2022: Pt. presents with increased MP, PIP, and DIP digit  tightness/stiffness limiting the formulation of bilateral composites fists during this progress reporting period limiting. Digit flexion to the [facility]: R:  2nd: 4 cm(3cm), 3rd: 6cm( 6cm), 4th: 6 cm (5cm), 5th: 5cm(5cm), L: 2nd: 8cm(8cm), 3rd: 8cm(6cm), 4th: 8cm(7cm), 5th: 6cm(6cm) 09/20/2022: Improving digit flexion to the [facility]. 09/13/22: improved digit flexion to the [facility]. 07/05/2022: Pt. presents with digit MP, PIP, and DIP extensor  tightness limiting her ability to securely grip objects in her bilateral hands.  04/07/2022: Pt. Has improved with right 2nd, and 3rd digit flexion towards the [facility]. Pt. Continues to have difficulty securely holding and applying deodorant.02/03/2022: Pt. Has improved with digit flexion, however, continues to have difficulty securely holding and using deodorant. 12/09/2021: Bilateral hand/digit MP, PIP, and DIP extension tightness limits her ability achieve digit flexion to hold, and apply deodorant. 10/28/2021:  Pt. continues to have difficulty holding the deodorant. 01/12/2021: Pt. Presents with limited digit extension. Pt. Is able to initiate holding deodorant, however is unable to hold it while using it  Goal status: Ongoing   4.  Pt. Will improve bilateral wrist extension in preparation for anticipating, and initiating reaching for objects at the table.  Baseline: 01/19/2023:  R: 46(64) L: 0(20) 12/06/2022: Pt continues to to progress with bilateral wrist extension in preparation fro functional reaching. 10/27/2022: Pt. Is improving with bilateral wrist extension. R: 46(64) L: 0(20) 09/20/2022: limited PROM in the Left wrist extension 2/2 tightness/stiffness. 09/13/22: R: 55(68), L: 0(12) 07/19/2022: Bilateral wrist extension in limited. 07/05/2022: Right: 40(60) Left: 0(12) 04/07/2022: Right: 40(55) Left: 5(18) 02/03/2022: Right: 34(55) Left: 3(4) 12/09/2021: Right  34(55)  Left  3(4) 10/28/2021:  Right 22(38), Left 0(15) 09/14/2021:  Right: 17(35), left 2(15)  Goal status: Limited progress with  wrist extension  this progress reporting period. Ongoing  7.  Pt. Will increase bilateral lateral pinch strength by 2 lbs to be able to securely grasp items during ADLs, and IADL tasks.  Baseline: 01/19/2023: Deferred as the Pinch meter is out for calibration.12/06/2022: Pt. Is able to more securely hold objects during ADLs/IADLs 10/27/2022: Pt. Is improving with holding items with her bilateral thumbs. (Pinch meter out for calibration) 09/20/2022: lateral pinch continues to be limited 09/13/22: R 6.5# L 4# 07/19/2022: TBD 07/05/2022: TBD 07/2022: NT-Pinch meter out for calibration. 02/03/2022: Right: 6#, Left: 4# 12/09/2021: Right: 6#, Left: 4#  Goal status: Deferred   9.  Pt. will complete plant care with modified independence.  Baseline:12/06/2022: Pt. Is independent with plant care in her husband's room, however is not able to access her plants due to furniture blocking access to them. 10/27/2022: Pt. Is able to water plants that are closer, and within reach. Pt. continues to have difficulty reaching for thorough plant care. 09/20/2022: Pt. Continues to have difficulty reaching for thorough plant care. 09/13/22: continues to report intermittent difficulty 07/19/2022: Pt. Continues to be able to water, and care for some of her plants. Pt. Has more difficulty with plants that are harder to reach. 07/05/2022: pt. Is able to water, and care for some of her plants. Pt. Has more difficulty with plants that are harder to reach. 04/07/2022: Pt. is now able to hold a cup and water her plants.02/03/2022: Pt. has difficulty caring for her plants.  Goal status:Achieved   10.  Pt. will demonstrate adaptive techniques to assist with the efficiency of self-dressing, or morning care tasks.  Baseline: 01/19/2203: Pt. Is able to donn her jacket in reverse independently. Pt. requires assist from staff, as Pt. Reports staff have decreased time. 12/06/2022: Pt. requires assist from staff, as Pt. Reports staff have decreased time.  10/27/2022: Pt. is able to assist with initiating UE dressing. Pt. requires assist from personal, and staff care aides. 09/20/2022: Pt. continues to require assist form personal, and staff care aides. 09/13/22: MODA dressing, reports she is often rushed 07/19/2022: Pt. continues to require assist with self-dressing/morning care tasks. 07/05/2022: Continue with  goal. 04/07/2022: Pt. Continues to require assist with the efficiency of self-dressing, and morning care tasks.02/03/2022: Pt. requires assist from caregivers 2/2 time limitations during morning care.  Goal status: Ongoing  11.  Pt. will improve BUE strength to improve ADL, and IADL functioning.  Baseline: 01/19/2023: L 4/5, R 3+/5 12/06/2022: Continue 10/27/2022: shoulder flexion L 4/5, R 3+/5; shoulder abduction L 4+/5, R 3+/5. elbow flexion B 5/5, elbow extension 4/5 09/20/2022: Pt. Presents with limited BUE strength 09/13/22: shoulder flexion L 4/5, R 3/5; elbow flexion B 5/5, elbow extension 4-/5; shoulder abduction L 4+/5, R 3+/5. 07/19/2022: BUE strength continues to be limited. 07/05/2022: shoulder flexion: right 4-/5, abduction: 3+/5, elbow flexion: right: 5/5, left 5/5, extension: right: 4-/5, left 4-/5, wrist extension: right: 3-/5, left: 2-/5  Goal status: Ongoing  12.  Pt. will improve bilateral thumb radial abduction in order to be able to hold the grab bars while standing with PT  Baseline: 01/19/2023: R: 38(48) L: 24(26)12/06/2022: Pt. Continues to present  with limited thumb abduction, however is improving holding onto the parallel bars.10/27/2022: thumb radial abduction: R: 30(42), L: 20(24  Goal status: Ongoing  13.  Pt. Will be able to securely hold, and stabilize medication bottles at a tabletop surface when opening, and closing them.  Baseline: 01/19/2023: Pt. Is now able to securely hold medication bottles while opening them, and stabilizes them on surfaces while opening them. 12/06/2022: Pt is improving with securely stabilizing  medication bottles. 10/27/2022: Pt. Stabilizes bottles against her torso when attempting to open, and close them   Goal status: Achieved  14: Pt. Will improve bilateral thumb IP flexion to improve active grasping patterns.    Baseline: 01/19/2023: Right 35(50), Left: 20/45)    Goal status: New    15. Pt. Will improve bilateral FMC/speed, and dexterity. As evidence by improved scores on the 9 hole peg test.   Baseline: 01/19/2023: 2 pegs placed in 1 min. & 5 sec.  Pt. was able to remove 9 pegs in 17 sec.; Left: 2 pegs placed in 1 min. & 10    sec. Pt. was able to remove 9 pegs in 20 sec.   Goal status: New      ASSESSMENT:  CLINICAL IMPRESSION:  Pt. is tolerating UE strengthening exercises without difficulty today. Pt. required cues initially for hand placement, and there. ex. technique. Pt. continues to benefit form OT services to work on impoving ROM, and UE strength in order to work towards increasing bilateral hand grasp on objects, and increasing engagement of bilateral hands during ADLs, and IADL tasks.    PERFORMANCE DEFICITS: in functional skills including ADLs, IADLs, coordination, dexterity, ROM, strength, and UE functional use, cognitive skills including , and psychosocial skills including coping strategies and environmental adaptation.   IMPAIRMENTS: are limiting patient from ADLs, IADLs, and leisure.   CO-MORBIDITIES: may have co-morbidities  that affects occupational performance. Patient will benefit from skilled OT to address above impairments and improve overall function.  MODIFICATION OR ASSISTANCE TO COMPLETE EVALUATION: Min-Moderate modification of tasks or assist with assess necessary to complete an evaluation.  OT OCCUPATIONAL PROFILE AND HISTORY: Detailed assessment: Review of records and additional review of physical, cognitive, psychosocial history related to current functional performance.  CLINICAL DECISION MAKING: Moderate - several treatment options, min-mod  task modification necessary  REHAB POTENTIAL: Good for stated goals  PLAN:  OT FREQUENCY 2x's a week  OT DURATION: 12 weeks  PLANNED INTERVENTIONS ADL training, A/E training, UE ther. Ex, Manual therapy, neuromuscular re-education,  moist heat modality, Paraffin Bath, Splinting, and  Pt./caregiver education   RECOMMENDED OTHER SERVICES: PT  CONSULTED AND AGREED WITH PLAN OF CARE: Patient  PLAN FOR NEXT SESSION: Continue Treatment as per established POC  Olegario Messier, MS, OTR/L  03/21/23, 4:10 PM

## 2023-03-23 ENCOUNTER — Ambulatory Visit: Payer: Medicare HMO | Admitting: Occupational Therapy

## 2023-03-23 ENCOUNTER — Ambulatory Visit: Payer: Medicare HMO

## 2023-03-27 NOTE — Therapy (Signed)
 OUTPATIENT PHYSICAL THERAPY NEURO TREATMENT/RECERT/Physical Therapy Progress Note   Dates of reporting period  01/05/2023   to   03/28/2023   Patient Name: Sherry [name] MRN: [id number] DOB:14-May-1934, 89 y.o., female Today's Date: 03/28/2023   PCP: Sherry Mealing, MD REFERRING PROVIDER: Deeann [name]   PT End of Session - 03/28/23 1150     Visit Number 100    Number of Visits 124    Date for PT Re-Evaluation 06/20/23    Authorization Type [employer] FOTO performed by PT on eval (7/20), score 12, Progress note on 03/10/2020; PN on 08/20/2020    Authorization Time Period Initial cert [id number]/84/1660-63/02/6008; Recert 01/11/2022-04/05/2022; Recert 04/07/2022-06/30/2022    Progress Note Due on Visit 100    PT Start Time 1146    PT Stop Time 1227    PT Time Calculation (min) 41 min    Equipment Utilized During Treatment Gait belt    Activity Tolerance Patient tolerated treatment well    Behavior During Therapy WFL for tasks assessed/performed                   Past Medical History:  Diagnosis Date   Acute blood loss anemia    Arthritis    Cancer (HCC)    skin   Central cord syndrome at C6 level of cervical spinal cord (HCC) 11/29/2017   Hypertension    Protein-calorie malnutrition, severe (HCC) 01/24/2018   S/P insertion of IVC (inferior vena caval) filter 01/24/2018   Tetraparesis (HCC)    Past Surgical History:  Procedure Laterality Date   ANTERIOR CERVICAL DECOMP/DISCECTOMY FUSION N/A 11/29/2017   Procedure: Cervical five-six, six-seven Anterior Cervical Decompression Fusion;  Surgeon: Sherry Pierini, MD;  Location: [facility] OR;  Service: Neurosurgery;  Laterality: N/A;  Cervical five-six, six-seven Anterior Cervical Decompression Fusion   CATARACT EXTRACTION     FEMUR IM NAIL Left 08/16/2021   Procedure: INTRAMEDULLARY (IM) NAIL FEMORAL;  Surgeon: Sherry Saint, MD;  Location: [facility] ORS;  Service: Orthopedics;  Laterality: Left;   IR IVC FILTER PLMT / S&I /IMG  GUID/MOD SED  12/08/2017   Patient Active Problem List   Diagnosis Date Noted   Age-related osteoporosis without current pathological fracture 07/27/2022   Mild recurrent major depression (HCC) 07/27/2022   Hypertensive heart disease with other congestive heart failure (HCC) 02/14/2022   Chronic indwelling Foley catheter 02/14/2022   Closed hip fracture (HCC) 08/15/2021   DVT (deep venous thrombosis) (HCC) 08/15/2021   Quadriplegia (HCC) 08/15/2021   Fall 08/15/2021   Constipation due to slow transit 08/31/2018   Trauma 06/05/2018   Neuropathic pain 06/05/2018   Neurogenic bowel 06/05/2018   Vaginal yeast infection 01/30/2018   Healthcare-associated pneumonia 01/25/2018   Chronic allergic rhinitis 01/24/2018   Depression with anxiety 01/24/2018   UTI due to Klebsiella species 01/24/2018   Protein-calorie malnutrition, severe (HCC) 01/24/2018   S/P insertion of IVC (inferior vena caval) filter 01/24/2018   Tetraparesis (HCC) 01/20/2018   Neurogenic bladder 01/20/2018   Reactive depression    Benign essential HTN    Acute postoperative anemia due to expected blood loss    Central cord syndrome at C6 level of cervical spinal cord (HCC) 11/29/2017   Allergy to alpha-gal 11/25/2016   SCC (squamous cell carcinoma) 04/08/2014    ONSET DATE: 08/15/2021 (fall with B hip fx); Initial injury was in 2019- quadraplegia due to central cord syndrome of C6 secondary to neck fracture.   REFERRING DIAG: Bilateral Hip fx; Left Open- s/p Left IM  nail femoral on 08/16/2021; Right closed  THERAPY DIAG:  Muscle weakness (generalized)  Other lack of coordination  Central cord syndrome, sequela (HCC)  Other abnormalities of gait and mobility  Difficulty in walking, not elsewhere classified  Closed fracture of both hips, initial encounter (HCC)  Abnormality of gait and mobility  Unsteadiness on feet  Rationale for Evaluation and Treatment Rehabilitation  SUBJECTIVE:   SUBJECTIVE  STATEMENT:   Patient reports she has heard back from orthotic company regarding new order for AFO. States otherwise doing okay.     Pt accompanied by:  Paid caregiver-   PERTINENT HISTORY: Sherry [name] is an 89yoF who experienced a fall at home on 08/15/2021 with hip fracture, s/p Left hip ORIF. PMH: quadriplegia due to central cord syndrome of C6 secondary to neck fracture, HTN, depression with anxiety, neurogenic bladder, bilateral DVT on Xarelto (s/p of IVC filter placement). Prior history of significant PT/OT with improvement of function. Pt has full use of shoulder/elbows, limited use of hands, adaptive self feeding sucessful.     PAIN:  None current but does endorse some left thigh pain intermittent  OBJECTIVE:    TODAY'S TREATMENT:   03/28/2023  Physical therapy treatment session today consisted of completing assessment of goals and administration of testing as demonstrated and documented in flow sheet, treatment, and goals section of this note. Addition treatments may be found below.   Manual Muscle Test Scale:  Left knee ext=  4/5 and right knee ext= 3-/5 (able to move lower leg through incomplete ROM -lacking 20 deg from full ROM against gravity)  MMT scale: 0/5 = No muscle contraction can be seen or felt 1/5 = Contraction can be felt, but there is no motion 2-/5 = Part moves through incomplete ROM w/ gravity decreased 2/5 = Part moves through complete ROM w/ gravity decreased 2+/5 = Part moves through incomplete ROM (<50%) against gravity or through complete ROM w/ gravity 3-/5 = Part moves through incomplete ROM (>50%) against gravity 3/5 = Part moves through complete ROM against gravity 3+/5 = Part moves through complete ROM against gravity/slight resistance 4-/5= Holds test position against slight to moderate pressure 4/5 = Part moves through complete ROM against gravity/moderate resistance 4+/5= Holds test position against moderate to strong pressure 5/5 = Part  moves through complete ROM against gravity/full resistance       PATIENT EDUCATION: Education details: Exercise technique, Discussion of how to obtain order/F2F for a new brace Patient verbalized understanding.   HOME EXERCISE PROGRAM: No changes at this time  Access Code: [facility] URL: [email or web address] Date: 11/23/2021 Prepared by: Precious Bard Exercises - Seated Gluteal Sets - 2 x daily - 7 x weekly - 2 sets - 10 reps - 5 hold - Seated Quad Set - 2 x daily - 7 x weekly - 2 sets - 10 reps - 5 hold - Seated Long Arc Quad - 2 x daily - 7 x weekly - 2 sets - 10 reps - 5 hold - Seated March - 2 x daily - 7 x weekly - 2 sets - 10 reps - 5 hold - Seated Hip Abduction - 2 x daily - 7 x weekly - 2 sets - 10 reps - 5 hold - Seated Shoulder Shrugs - 2 x daily - 7 x weekly - 2 sets - 10 reps - 5 hold - Wheelchair Pressure Relief - 2 x daily - 7 x weekly - 2 sets - 10 reps - 5 hold  GOALS: Goals reviewed with patient?  Yes  SHORT TERM GOALS: Target date: 02/22/2022  Pt will be independent with initial UE strengthening HEP in order to improve strength and balance in order to decrease fall risk and improve function at home and work. Baseline: 10/19/2021- patient with no formal UE HEP; 12/02/2021= Patient verbalized knowledge of HEP including use of theraband for UE strengthening.  Goal status: GOAL MET  LONG TERM GOALS: Target date: 06/20/2023  Pt will be independent with final for UE/LE HEP in order to improve strength and balance in order to decrease fall risk and improve function at home and work. Baseline: Patient is currently BLE NWB and unable to participate in HEP. Has order for UE strengthening. 01/11/2022- Patient now able to participate in LE strengthening although NWB still- good understanding for some basic exercises. Will keep goal active to incorporate progressive LE strengthening exercises. 04/07/2022= Patient still participating in progressive LE seated HEP and has no  questions at this time.  Goal status: MET  2.  Pt will improve FOTO to target score of 35  to display perceived improvements in ability to complete ADL's.  Baseline: 10/19/2021= 12; 12/02/2021= 17; 01/11/2022=17; 04/07/2022= 17; 07/05/2022=17; 10/06/2022- outcome measure not appropriate as patient in non-ambulatory and not consistently standing. Goal status: Goal not appropriate  3.  Pt will increase strength of B UE  by at least 1/2 MMT grade in order to demonstrate improvement in strength and function  Baseline: patient has range of 2-/5 to 4/5 BUE Strength; 12/02/2021= 4/5 except with wrist ext. 01/11/2022= 4/5 B UE strength expept for wrist Goal status: Goal revised-no longer appropriate- working with OT on all UE strengthening.   4. Pt. Will increase strength of RLE by at least 1/2 MMT grade in order to demonstrate improvement in Standing/transfers.  Baseline: 2-/5 Right hip flex/knee ext/flex; 04/07/2022= 2- with right hip flex/knee ext (lacking 28 deg from zero) 4/8= Patient able to ext right knee lacking 18 deg from zero. 07/05/2022=Left knee approx 8 deg from zero and right knee = 23 deg from zero; 12/29/2022 - Left LE = full ROM 4-/5 and right knee ext= 23 deg from zero= 2+/5 (gravity minimized); 03/28/2023= Left knee ext=  4/5 and right knee ext= 3-/5 (able to move lower leg through incomplete ROM -lacking 20 deg from full ROM against gravity)  Goal status: Progressing  5. Pt. Will demo ability to stand pivot transfer with max assist for improved functional mobility and less dependent need on mechanical device.   Baseline: Dependent on hoyer lift for all transfers; 04/07/2022- Unable to test secondary to patient with recent UTI and right hand procedure - will attempt to reassess next visit. ; 04/12/22: Unable to successfully stand due to feet plates on loaned power w/c; 05/10/2022- Patient still having to use rental chair and has not received her original power w/c back to practice SPT. 07/04/2021= Patient  just received new power w/c and attempted standing from 1st time today- Patient able to stand with max assist today from w/c and did not attempt to perform pivot transfer- difficulty with static standing and placing weight on right LE. 10/06/2022- Not assessed today- patient needs new AFO prior to attempting transfers; 12/29/2022= Patient has not attempted SPT yet - still trying to improve LE strength enough to stand well without knee buckling with weight shifting. 03/28/2023- not attempted today due to ill fitting brace but patient last performed SPT on 01/10/23 and was able to perform with Max A with feet pre-positioned to angle toward direction of transfer  Goal status: ONGOING  6. Pt. Will demonstrate improved functional LE strength as seen by ability to stand > 2 min for improved transfer ability and pregait abilities.   Baseline: not assessed today due to recent dx: UTI; 04/07/2022- Unable to test secondary to patient with recent UTI and right hand procedure - will attempt to reassess next visit; 04/12/22: Unable to successfully stand due to feet plates on loaned power w/c 05/10/2022- Patient still having to use rental chair and has not received her original power w/c back to practice Standing. 07/05/2022- Patient able to stand with max assist at trunk today for 2 min 3 sec. Will keep goal active to ensure consistency. 10/06/2022= Unable to assess today- patient with some recent blood pressure issues and needs new AFO. 11/24/2022= Patient demonstrated multiple rounds of standing in // bars- holding up to 1:45 min today but has been able to exhibit 2 min in recent past- working on consistency. 12/29/2022= 2 min 40 sec with patient demonstrating improved overall posture and ability to contract glutes and quads with just min assist at trunk today.   Goal status: MET  7. Pt. Will demonstrate improved functional LE strength as seen by ability to stand > 5 min for improved transfer ability and pregait  abilities. Baseline: 12/29/2022= 2 min 40 sec with patient demonstrating improved overall posture and ability to contract glutes and quads with just min assist at trunk today. 03/28/2023= Attempted 2 trials today - at best 2 min 48 sec today but during cert- did stand for 3 min 44 sec.  Goal Status: Ongoing- Patient will likely receive new AFO in upcoming weeks and anticipate more standing time once received.  8. Patient will perform sit to stand transfer with moderate assist consistently > 75% of time for improved transfer ability and less dependence on caregiver.   Baseline: 12/29/2022- Patient currently max assist level assistance with all sit to stand activities. 03/28/2023= Patient is still majority of max assist to stand yet able to pull up from wheelchair smoother in one motion.   Goal status: ONGOING   ASSESSMENT:  CLINICAL IMPRESSION:   Patient presented with good motivation overall for today's reassessment. She is struggling some with current left AFO making standing difficult. She has new order from MD and awaiting visit with orthotist for new fitting. During this cert treatment concentrated more of LE strengthening and patient was able to demo some progress with right LE and Left LE as seen with MMT. She is almost able to achieve full right knee ext in gravity position. Patient's condition has the potential to improve in response to therapy. Maximum improvement is yet to be obtained. The anticipated improvement is attainable and reasonable in a generally predictable time.  Pt will continue to benefit from skilled PT services to optimize independence and reduced caregiver support.   Will focus more on transfers, Sit to stand and standing once patient receives AFO.   OBJECTIVE IMPAIRMENTS Abnormal gait, decreased activity tolerance, decreased balance, decreased coordination, decreased endurance, decreased mobility, difficulty walking, decreased ROM, decreased strength, hypomobility, impaired  flexibility, impaired UE functional use, postural dysfunction, and pain.   ACTIVITY LIMITATIONS carrying, lifting, bending, sitting, standing, squatting, sleeping, stairs, transfers, bed mobility, continence, bathing, toileting, dressing, self feeding, reach over head, hygiene/grooming, and caring for others  PARTICIPATION LIMITATIONS: meal prep, cleaning, laundry, medication management, personal finances, interpersonal relationship, driving, shopping, community activity, and yard work  PERSONAL FACTORS Age, Time since onset of injury/illness/exacerbation, and 1-2 comorbidities: HTN, cervical Sx  are also affecting patient's functional outcome.   REHAB POTENTIAL: Good  CLINICAL DECISION MAKING: Evolving/moderate complexity  EVALUATION COMPLEXITY: Moderate  PLAN: PT FREQUENCY: 1-2x/week  PT DURATION: 12 weeks  PLANNED INTERVENTIONS: Therapeutic exercises, Therapeutic activity, Neuromuscular re-education, Balance training, Gait training, Patient/Family education, Self Care, Joint mobilization, DME instructions, Dry Needling, Electrical stimulation, Wheelchair mobility training, Spinal mobilization, Cryotherapy, Moist heat, and Manual therapy  PLAN FOR NEXT SESSION:  LE and core strength training, Sit to stand and static standing; progress to SPT as able.    3:31 PM, 03/28/23 Louis Meckel, PT Physical Therapist - [facility]  Outpatient Physical Therapy- Main Campus [phone]

## 2023-03-28 ENCOUNTER — Encounter: Payer: Self-pay | Admitting: Occupational Therapy

## 2023-03-28 ENCOUNTER — Ambulatory Visit: Payer: Medicare HMO

## 2023-03-28 ENCOUNTER — Ambulatory Visit: Payer: Medicare HMO | Admitting: Occupational Therapy

## 2023-03-28 DIAGNOSIS — R262 Difficulty in walking, not elsewhere classified: Secondary | ICD-10-CM

## 2023-03-28 DIAGNOSIS — R2681 Unsteadiness on feet: Secondary | ICD-10-CM

## 2023-03-28 DIAGNOSIS — R269 Unspecified abnormalities of gait and mobility: Secondary | ICD-10-CM

## 2023-03-28 DIAGNOSIS — S14129S Central cord syndrome at unspecified level of cervical spinal cord, sequela: Secondary | ICD-10-CM

## 2023-03-28 DIAGNOSIS — M6281 Muscle weakness (generalized): Secondary | ICD-10-CM | POA: Diagnosis not present

## 2023-03-28 DIAGNOSIS — R278 Other lack of coordination: Secondary | ICD-10-CM

## 2023-03-28 DIAGNOSIS — S72001A Fracture of unspecified part of neck of right femur, initial encounter for closed fracture: Secondary | ICD-10-CM

## 2023-03-28 DIAGNOSIS — R2689 Other abnormalities of gait and mobility: Secondary | ICD-10-CM

## 2023-03-28 NOTE — Therapy (Addendum)
 Occupational Therapy Neuro Treatment Note        Patient Name: Sherry [name] MRN: [id number] DOB:Feb 15, 1934, 89 y.o., female Today's Date: 11/29/2022  PCP:  Cindi Carbon PROVIDER: Earnestine Mealing, MD  END OF SESSION:   OT End of Session - 03/28/23 1723     Visit Number [id number]    Number of Visits 168    Date for OT Re-Evaluation 04/13/23    Authorization Time Period Progress report period starting 0812/2024    OT Start Time 1105   OT Stop Time 1145    OT Time Calculation (min) 40 min    Equipment Utilized During Treatment powered wheelchair    Activity Tolerance Patient tolerated treatment well    Behavior During Therapy WFL for tasks assessed/performed                 Past Medical History:  Diagnosis Date   Acute blood loss anemia    Arthritis    Cancer (HCC)    skin   Central cord syndrome at C6 level of cervical spinal cord (HCC) 11/29/2017   Hypertension    Protein-calorie malnutrition, severe (HCC) 01/24/2018   S/P insertion of IVC (inferior vena caval) filter 01/24/2018   Tetraparesis (HCC)    Past Surgical History:  Procedure Laterality Date   ANTERIOR CERVICAL DECOMP/DISCECTOMY FUSION N/A 11/29/2017   Procedure: Cervical five-six, six-seven Anterior Cervical Decompression Fusion;  Surgeon: Jadene Pierini, MD;  Location: [facility] OR;  Service: Neurosurgery;  Laterality: N/A;  Cervical five-six, six-seven Anterior Cervical Decompression Fusion   CATARACT EXTRACTION     FEMUR IM NAIL Left 08/16/2021   Procedure: INTRAMEDULLARY (IM) NAIL FEMORAL;  Surgeon: Deeann Saint, MD;  Location: [facility] ORS;  Service: Orthopedics;  Laterality: Left;   IR IVC FILTER PLMT / S&I /IMG GUID/MOD SED  12/08/2017   Patient Active Problem List   Diagnosis Date Noted   Age-related osteoporosis without current pathological fracture 07/27/2022   Mild recurrent major depression (HCC) 07/27/2022   Hypertensive heart disease with other congestive heart failure (HCC) 02/14/2022    Chronic indwelling Foley catheter 02/14/2022   Closed hip fracture (HCC) 08/15/2021   DVT (deep venous thrombosis) (HCC) 08/15/2021   Quadriplegia (HCC) 08/15/2021   Fall 08/15/2021   Constipation due to slow transit 08/31/2018   Trauma 06/05/2018   Neuropathic pain 06/05/2018   Neurogenic bowel 06/05/2018   Vaginal yeast infection 01/30/2018   Healthcare-associated pneumonia 01/25/2018   Chronic allergic rhinitis 01/24/2018   Depression with anxiety 01/24/2018   UTI due to Klebsiella species 01/24/2018   Protein-calorie malnutrition, severe (HCC) 01/24/2018   S/P insertion of IVC (inferior vena caval) filter 01/24/2018   Tetraparesis (HCC) 01/20/2018   Neurogenic bladder 01/20/2018   Reactive depression    Benign essential HTN    Acute postoperative anemia due to expected blood loss    Central cord syndrome at C6 level of cervical spinal cord (HCC) 11/29/2017   Allergy to alpha-gal 11/25/2016   SCC (squamous cell carcinoma) 04/08/2014   ONSET DATE: 11/29/2017  REFERRING DIAG: Central Cord Syndrome at C6 of the Cervical Spinal Cord, Fall with Bilateral Closed Hip Fractures, with ORIF repair with Intramedullary Nailing of the Left Hip Fracture.   THERAPY DIAG:  Muscle weakness (generalized)  Other lack of coordination  Rationale for Evaluation and Treatment: Rehabilitation  SUBJECTIVE:   SUBJECTIVE STATEMENT:  Pt. reports doing well today  Pt accompanied by: self, Personal Care aide  PERTINENT HISTORY: Central Cord Syndrome at C6 of the Cervical Spinal  Cord, Fall with Bilateral Closed Hip Fractures, with ORIF repair with Intramedullary Nailing of the Left Hip Fracture.   PRECAUTIONS: None  WEIGHT BEARING RESTRICTIONS: No  PAIN:  Are you having pain? No pain, just shoulder stiffness   LIVING ENVIRONMENT: Lives with: [facility]  Stairs: No Has following equipment at home: Wheelchair (power)  PLOF: Independent   PATIENT GOALS:  See below for  established goals  OBJECTIVE:  Note: Objective measures were completed at Evaluation unless otherwise noted.  HAND DOMINANCE: Right  ADLs: Caregiver assist with ADLs, and [facility]  MOBILITY STATUS: Uses a power w/c  ACTIVITY TOLERANCE: Activity tolerance: Fair  FUNCTIONAL OUTCOME MEASURES:  Measurements:   12/09/2021:   Shoulder flexion: R: 120(136), L: 138(150) Shoulder abduction: R: 108(120), L: 110(120) Elbow: R: 0-148, L: 0-146 Wrist flexion: R: 55(62), L: 78 Writ extension: R: 34(55), L: 3(4) RD: R: 12(20), L: 16(26) UD: R: 8(24), L: 6(18) Thumb radial abduction: R: 11(20), L: 8(20) Digit flexion to the [facility]: R:  2nd: 5cm(3.5cm), 3rd: 7.5 cm(5cm), 4th: 7cm(3cm), 5th: 6cm(5.5cm) L: 2nd: 8cm(8cm), 3rd: 8cm(8cm), 4th: 7.5cm(7cm), 5th: 7cm(7cm)   02/03/2022:   Shoulder flexion: R: 122(138), L: 148(154) Shoulder abduction: R: 109(125), L: 125(125 Elbow: R: 0-148, L: 0-146 Wrist flexion: R: 60(68), L: 79 Writ extension: R: 40(55), L: 3(10) RD: R: 14(24), L: 20(28) UD: R: 8(24), L: 6(18) Thumb radial abduction: R: 20(25), L: 8(20) Digit flexion to the [facility]: R:  2nd: 4.5cm(3cm), 3rd: 6.5cm(4.5cm), 4th: 6.5 cm(3cm), 5th: 4.5cm(5cm) L: 2nd: 8cm(8cm), 3rd: 8cm(7cm), 4th: 7.5cm(7cm), 5th: 6.5cm(6.5cm)   04/07/2022:   Shoulder flexion: R: 125(150), L: 160(160) Shoulder abduction: R: 109(125), L: 125(125 Elbow: R: 0-148, L: 0-146 Wrist flexion: R: 60(68), L: 79 Writ extension: R: 40(55), L: 5(18) RD: R: 14(24), L: 20(28) UD: R: 18(24), L: 8(18) Thumb radial abduction: R: 20(25), L: 8(20) Digit flexion to the [facility]: R:  2nd: 4.5 cm(4 cm), 3rd: 6.5cm(3cm), 4th: 7 cm (5cm), 5th: 5cm(5cm) L: 2nd: 8cm(8cm), 3rd: 8cm(7cm), 4th: 7.5cm(7cm), 5th: 7cm(7cm)   07/05/2022:   Shoulder flexion: R: 125(154), L: 160(160) Shoulder abduction: R: 95(130), L: 143(143) Elbow: R: 0-148, L: 0-146 Wrist flexion: R: 60(68), L: 79 Writ extension: R: 40(60), L: 0(12) RD: R: 10(24), L:  12(20) UD: R: 16(24), L: 8(16) Thumb radial abduction: R: 20(25), L: 8(20) Digit flexion to the [facility]: R:  2nd: 4 cm(4cm), 3rd: 6.5cm( 3.5cm), 4th: 7 cm (5cm), 5th: 6cm(5cm) L: 2nd: 8cm(8cm), 3rd: 8cm(7cm), 4th: 8cm(7cm), 5th: 7cm(7cm)   09/13/2022:   Shoulder flexion: R: 125(154), L: 160(160) Shoulder abduction: R: 100(130), L: 143(143) Elbow: R: 0-150, L: 0-150 Wrist flexion: R: 55(68), L: 79 Writ extension: R: 45(60), L: 0(12) RD: R: 10(24), L: 12(20) UD: R: 16(24), L: 8(16) Thumb radial abduction: R: 20(25), L: 8(20) Digit flexion to the [facility]: R:  2nd: 4 cm(4cm), 3rd: 6cm( 5cm), 4th: 6 cm (4cm), 5th: 5cm(5cm) L: 2nd: 7cm(cm), 3rd: 7cm(6cm), 4th: 7cm(6cm), 5th: 6cm(6cm)   10/27/2022   Shoulder flexion: R: 128(154), L: 160(160) Shoulder abduction: R: 105(130), L: 143(143) Elbow: R: 0-150, L: 0-150 Wrist flexion: R: 55(68), L: 79 Writ extension: R: 46(64), L: 0(20) RD: R: 10(20), L: 18(24) UD: R: 118(28), L: 8(20) Thumb radial abduction: R: 30(42), L:20(24) Digit flexion to the [facility]: R:  2nd: 4 cm(3cm), 3rd: 6cm( 6cm), 4th: 6 cm (5cm), 5th: 5cm(5cm) L: 2nd: 8cm(8cm), 3rd: 8cm(6cm), 4th: 8cm(7cm), 5th: 6cm(6cm)  01/19/2023  Shoulder flexion: R: 131(154), L: 160(160) Shoulder abduction: R:  110(130), L: 143(143) Elbow: R: 0-150, L: 0-150 Wrist flexion: R: 55(68), L: 79 Writ extension: R: 46(64), L: 0(20) RD: R: 10(20), L: 18(24) UD: R: 118(28), L: 14(20) Thumb radial abduction: R: 38(48), L:24(26) Thumb IP flexion: R: 35(50) L: 20(45) Digit flexion to the [facility]: R:  2nd: 3.5 cm(3cm), 3rd: 6cm( 6cm), 4th: 6 cm (5cm), 5th: 5cm(4.5cm) L: 2nd: 8cm(7.5cm), 3rd: 8cm(6cm), 4th: 7.5cm(6cm), 5th: 6cm(4.5cm)  COORDINATION:   01/19/2023:  9 hole peg test:  Right: 2 pegs placed in 1 min. & 5 sec.  Pt. was able to remove 9 pegs in 17 sec.  Left: 2 pegs placed in 1 min. & 10 sec. Pt. was able to remove 9 pegs in 20 sec.  SENSATION: Intact  EDEMA: N/A   COGNITION: Overall  cognitive status: WNL   VISION: Subjective report: Wears glasses, No changes in vision.  PERCEPTION: Intact  TODAY'S TREATMENT:                                                                                                                              DATE:  03/28/23   Manual Therapy:   Pt. tolerated soft tissue massage to the volar, and dorsal surface of each digit on the bilateral hands, digit lateral, and sagittal bands 2/2 to stiffness following moist heat modality. Soft tissue mobilization was performed for carpal, and metacarpal spread stretches. Manual therapy was performed independent of, and in preparation for therapeutic Ex.     Therapeutic Ex.    Pt. tolerated PROM followed by AROM bilateral wrist extension, PROM for bilateral digit MP, PIP, and DIP flexion, and extension, thumb abduction. Pt. performed 2# dowel ex. For UE strengthening secondary to weakness. Bilateral shoulder flexion, chest press, circular patterns, and elbow flexion/extension were performed.     PATIENT EDUCATION:  Education details: Bilateral hand function, ROM, and strengthening, reaching, and [facility]  Person educated: Patient Education method: Explanation, Demonstration, and Verbal cues Education comprehension: verbalized understanding and returned demonstration  HOME EXERCISE PROGRAM: Continue to assess HEP needs, and provide as needed.   GOALS: Goals reviewed with patient? Yes  LONG TERM GOALS: Target date: 04/13/2023  3.  Patient will demonstrate improved composite finger flexion to be able to firmly hold and use adaptive devices during ADLs, and IADLs.  Baseline: 01/19/2023: Pt. Is improving with Bilateral digit flexion to the [facility]. Pt. has difficulty firmly holding adaptive devices. 12/06/2022: Pt. continues to present with increased MP, PIP, and DIP digit tightness/stiffness limiting the formulation of bilateral composites fists. 10/27/2022: Pt. presents with increased MP, PIP, and DIP digit  tightness/stiffness limiting the formulation of bilateral composites fists during this progress reporting period limiting. Digit flexion to the [facility]: R:  2nd: 4 cm(3cm), 3rd: 6cm( 6cm), 4th: 6 cm (5cm), 5th: 5cm(5cm), L: 2nd: 8cm(8cm), 3rd: 8cm(6cm), 4th: 8cm(7cm), 5th: 6cm(6cm) 09/20/2022: Improving digit flexion to the [facility] 09/13/22: improved digit flexion to the [facility]. 07/05/2022: Pt. presents with digit MP, PIP, and DIP  extensor tightness limiting her ability to securely grip objects in her bilateral hands.  04/07/2022: Pt. Has improved with right 2nd, and 3rd digit flexion towards the [facility]. Pt. Continues to have difficulty securely holding and applying deodorant.02/03/2022: Pt. Has improved with digit flexion, however, continues to have difficulty securely holding and using deodorant. 12/09/2021: Bilateral hand/digit MP, PIP, and DIP extension tightness limits her ability achieve digit flexion to hold, and apply deodorant. 10/28/2021:  Pt. continues to have difficulty holding the deodorant. 01/12/2021: Pt. Presents with limited digit extension. Pt. Is able to initiate holding deodorant, however is unable to hold it while using it  Goal status: Ongoing   4.  Pt. Will improve bilateral wrist extension in preparation for anticipating, and initiating reaching for objects at the table.  Baseline: 01/19/2023:  R: 46(64) L: 0(20) 12/06/2022: Pt continues to to progress with bilateral wrist extension in preparation fro functional reaching. 10/27/2022: Pt. Is improving with bilateral wrist extension. R: 46(64) L: 0(20) 09/20/2022: limited PROM in the Left wrist extension 2/2 tightness/stiffness. 09/13/22: R: 55(68), L: 0(12) 07/19/2022: Bilateral wrist extension in limited. 07/05/2022: Right: 40(60) Left: 0(12) 04/07/2022: Right: 40(55) Left: 5(18) 02/03/2022: Right: 34(55) Left: 3(4) 12/09/2021: Right  34(55)  Left  3(4) 10/28/2021:  Right 22(38), Left 0(15) 09/14/2021:  Right: 17(35), left 2(15)  Goal status: Limited progress with  wrist extension  this progress reporting period. Ongoing  7.  Pt. Will increase bilateral lateral pinch strength by 2 lbs to be able to securely grasp items during ADLs, and IADL tasks.  Baseline: 01/19/2023: Deferred as the Pinch meter is out for calibration.12/06/2022: Pt. Is able to more securely hold objects during ADLs/IADLs 10/27/2022: Pt. Is improving with holding items with her bilateral thumbs. (Pinch meter out for calibration) 09/20/2022: lateral pinch continues to be limited 09/13/22: R 6.5# L 4# 07/19/2022: TBD 07/05/2022: TBD 07/2022: NT-Pinch meter out for calibration. 02/03/2022: Right: 6#, Left: 4# 12/09/2021: Right: 6#, Left: 4#  Goal status: Deferred   9.  Pt. will complete plant care with modified independence.  Baseline:12/06/2022: Pt. Is independent with plant care in her husband's room, however is not able to access her plants due to furniture blocking access to them. 10/27/2022: Pt. Is able to water plants that are closer, and within reach. Pt. continues to have difficulty reaching for thorough plant care. 09/20/2022: Pt. Continues to have difficulty reaching for thorough plant care. 09/13/22: continues to report intermittent difficulty 07/19/2022: Pt. Continues to be able to water, and care for some of her plants. Pt. Has more difficulty with plants that are harder to reach. 07/05/2022: pt. Is able to water, and care for some of her plants. Pt. Has more difficulty with plants that are harder to reach. 04/07/2022: Pt. is now able to hold a cup and water her plants.02/03/2022: Pt. has difficulty caring for her plants.  Goal status:Achieved   10.  Pt. will demonstrate adaptive techniques to assist with the efficiency of self-dressing, or morning care tasks.  Baseline: 01/19/2203: Pt. Is able to donn her jacket in reverse independently. Pt. requires assist from staff, as Pt. Reports staff have decreased time. 12/06/2022: Pt. requires assist from staff, as Pt. Reports staff have decreased time.  10/27/2022: Pt. is able to assist with initiating UE dressing. Pt. requires assist from personal, and staff care aides. 09/20/2022: Pt. continues to require assist form personal, and staff care aides. 09/13/22: MODA dressing, reports she is often rushed 07/19/2022: Pt. continues to require assist with self-dressing/morning care tasks. 07/05/2022: Continue  with goal. 04/07/2022: Pt. Continues to require assist with the efficiency of self-dressing, and morning care tasks.02/03/2022: Pt. requires assist from caregivers 2/2 time limitations during morning care.  Goal status: Ongoing  11.  Pt. will improve BUE strength to improve ADL, and IADL functioning.  Baseline: 01/19/2023: L 4/5, R 3+/5 12/06/2022: Continue 10/27/2022: shoulder flexion L 4/5, R 3+/5; shoulder abduction L 4+/5, R 3+/5. elbow flexion B 5/5, elbow extension 4/5 09/20/2022: Pt. Presents with limited BUE strength 09/13/22: shoulder flexion L 4/5, R 3/5; elbow flexion B 5/5, elbow extension 4-/5; shoulder abduction L 4+/5, R 3+/5. 07/19/2022: BUE strength continues to be limited. 07/05/2022: shoulder flexion: right 4-/5, abduction: 3+/5, elbow flexion: right: 5/5, left 5/5, extension: right: 4-/5, left 4-/5, wrist extension: right: 3-/5, left: 2-/5  Goal status: Ongoing  12.  Pt. will improve bilateral thumb radial abduction in order to be able to hold the grab bars while standing with PT  Baseline: 01/19/2023: R: 38(48) L: 24(26)12/06/2022: Pt. Continues to present  with limited thumb abduction, however is improving holding onto the parallel bars.10/27/2022: thumb radial abduction: R: 30(42), L: 20(24  Goal status: Ongoing  13.  Pt. Will be able to securely hold, and stabilize medication bottles at a tabletop surface when opening, and closing them.  Baseline: 01/19/2023: Pt. Is now able to securely hold medication bottles while opening them, and stabilizes them on surfaces while opening them. 12/06/2022: Pt is improving with securely stabilizing  medication bottles. 10/27/2022: Pt. Stabilizes bottles against her torso when attempting to open, and close them   Goal status: Achieved  14: Pt. Will improve bilateral thumb IP flexion to improve active grasping patterns.    Baseline: 01/19/2023: Right 35(50), Left: 20/45)    Goal status: New    15. Pt. Will improve bilateral FMC/speed, and dexterity. As evidence by improved scores on the 9 hole peg test.   Baseline: 01/19/2023: 2 pegs placed in 1 min. & 5 sec.  Pt. was able to remove 9 pegs in 17 sec.; Left: 2 pegs placed in 1 min. & 10    sec. Pt. was able to remove 9 pegs in 20 sec.   Goal status: New      ASSESSMENT:  CLINICAL IMPRESSION:  Pt. Tolerated BUE strengthening with a 2# dowel weight today for UE strengthening exercises without difficulty today. Pt. Required fewer cues initially for hand placement, and there. ex. technique. Pt. Continues to present with extensor tightness in the digits of the bilateral hands, as well as the thumb adductors limiting the thumb webspace. Pt. continues to benefit form OT services to work on impoving ROM, and UE strength in order to work towards increasing bilateral hand grasp on objects, and increasing engagement of bilateral hands during ADLs, and IADL tasks.    PERFORMANCE DEFICITS: in functional skills including ADLs, IADLs, coordination, dexterity, ROM, strength, and UE functional use, cognitive skills including , and psychosocial skills including coping strategies and environmental adaptation.   IMPAIRMENTS: are limiting patient from ADLs, IADLs, and leisure.   CO-MORBIDITIES: may have co-morbidities  that affects occupational performance. Patient will benefit from skilled OT to address above impairments and improve overall function.  MODIFICATION OR ASSISTANCE TO COMPLETE EVALUATION: Min-Moderate modification of tasks or assist with assess necessary to complete an evaluation.  OT OCCUPATIONAL PROFILE AND HISTORY: Detailed assessment:  Review of records and additional review of physical, cognitive, psychosocial history related to current functional performance.  CLINICAL DECISION MAKING: Moderate - several treatment options, min-mod task modification necessary  REHAB POTENTIAL: Good for stated goals  PLAN:  OT FREQUENCY 2x's a week  OT DURATION: 12 weeks  PLANNED INTERVENTIONS ADL training, A/E training, UE ther. Ex, Manual therapy, neuromuscular re-education, moist heat modality, Paraffin Bath, Splinting, and  Pt./caregiver education   RECOMMENDED OTHER SERVICES: PT  CONSULTED AND AGREED WITH PLAN OF CARE: Patient  PLAN FOR NEXT SESSION: Continue Treatment as per established POC  Olegario Messier, MS, OTR/L  03/28/23, 5:25 PM

## 2023-03-30 ENCOUNTER — Ambulatory Visit: Payer: Medicare HMO | Admitting: Occupational Therapy

## 2023-03-30 ENCOUNTER — Ambulatory Visit: Payer: Medicare HMO

## 2023-04-04 ENCOUNTER — Ambulatory Visit: Payer: Medicare HMO | Attending: Internal Medicine | Admitting: Occupational Therapy

## 2023-04-04 ENCOUNTER — Ambulatory Visit: Payer: Medicare HMO

## 2023-04-04 DIAGNOSIS — R296 Repeated falls: Secondary | ICD-10-CM | POA: Insufficient documentation

## 2023-04-04 DIAGNOSIS — M6281 Muscle weakness (generalized): Secondary | ICD-10-CM | POA: Diagnosis present

## 2023-04-04 DIAGNOSIS — S14129S Central cord syndrome at unspecified level of cervical spinal cord, sequela: Secondary | ICD-10-CM | POA: Insufficient documentation

## 2023-04-04 DIAGNOSIS — R2689 Other abnormalities of gait and mobility: Secondary | ICD-10-CM

## 2023-04-04 DIAGNOSIS — R269 Unspecified abnormalities of gait and mobility: Secondary | ICD-10-CM | POA: Insufficient documentation

## 2023-04-04 DIAGNOSIS — R2681 Unsteadiness on feet: Secondary | ICD-10-CM | POA: Insufficient documentation

## 2023-04-04 DIAGNOSIS — R262 Difficulty in walking, not elsewhere classified: Secondary | ICD-10-CM

## 2023-04-04 DIAGNOSIS — S72001A Fracture of unspecified part of neck of right femur, initial encounter for closed fracture: Secondary | ICD-10-CM | POA: Insufficient documentation

## 2023-04-04 DIAGNOSIS — R278 Other lack of coordination: Secondary | ICD-10-CM | POA: Diagnosis present

## 2023-04-04 DIAGNOSIS — S72002A Fracture of unspecified part of neck of left femur, initial encounter for closed fracture: Secondary | ICD-10-CM | POA: Diagnosis present

## 2023-04-04 NOTE — Therapy (Signed)
 Occupational Therapy Neuro Treatment /Recertification Note Occupational Therapy Progress Note  Dates of reporting period  01/19/2023   to   04/04/2023         Patient Name: Sherry [name] MRN: [id number] DOB:10/24/1934, 89 y.o., female Today's Date: 11/29/2022  PCP:  Cindi Carbon PROVIDER: Earnestine Mealing, MD  END OF SESSION:   OT End of Session - 04/04/23 1106     Visit Number 110    Number of Visits 192    Date for OT Re-Evaluation 06/27/23    OT Start Time 1100    OT Stop Time 1145    OT Time Calculation (min) 45 min    Equipment Utilized During Treatment powered wheelchair    Activity Tolerance Patient tolerated treatment well    Behavior During Therapy WFL for tasks assessed/performed                     Past Medical History:  Diagnosis Date   Acute blood loss anemia    Arthritis    Cancer (HCC)    skin   Central cord syndrome at C6 level of cervical spinal cord (HCC) 11/29/2017   Hypertension    Protein-calorie malnutrition, severe (HCC) 01/24/2018   S/P insertion of IVC (inferior vena caval) filter 01/24/2018   Tetraparesis (HCC)    Past Surgical History:  Procedure Laterality Date   ANTERIOR CERVICAL DECOMP/DISCECTOMY FUSION N/A 11/29/2017   Procedure: Cervical five-six, six-seven Anterior Cervical Decompression Fusion;  Surgeon: Jadene Pierini, MD;  Location: [facility] OR;  Service: Neurosurgery;  Laterality: N/A;  Cervical five-six, six-seven Anterior Cervical Decompression Fusion   CATARACT EXTRACTION     FEMUR IM NAIL Left 08/16/2021   Procedure: INTRAMEDULLARY (IM) NAIL FEMORAL;  Surgeon: Deeann Saint, MD;  Location: [facility] ORS;  Service: Orthopedics;  Laterality: Left;   IR IVC FILTER PLMT / S&I /IMG GUID/MOD SED  12/08/2017   Patient Active Problem List   Diagnosis Date Noted   Age-related osteoporosis without current pathological fracture 07/27/2022   Mild recurrent major depression (HCC) 07/27/2022   Hypertensive heart disease with other  congestive heart failure (HCC) 02/14/2022   Chronic indwelling Foley catheter 02/14/2022   Closed hip fracture (HCC) 08/15/2021   DVT (deep venous thrombosis) (HCC) 08/15/2021   Quadriplegia (HCC) 08/15/2021   Fall 08/15/2021   Constipation due to slow transit 08/31/2018   Trauma 06/05/2018   Neuropathic pain 06/05/2018   Neurogenic bowel 06/05/2018   Vaginal yeast infection 01/30/2018   Healthcare-associated pneumonia 01/25/2018   Chronic allergic rhinitis 01/24/2018   Depression with anxiety 01/24/2018   UTI due to Klebsiella species 01/24/2018   Protein-calorie malnutrition, severe (HCC) 01/24/2018   S/P insertion of IVC (inferior vena caval) filter 01/24/2018   Tetraparesis (HCC) 01/20/2018   Neurogenic bladder 01/20/2018   Reactive depression    Benign essential HTN    Acute postoperative anemia due to expected blood loss    Central cord syndrome at C6 level of cervical spinal cord (HCC) 11/29/2017   Allergy to alpha-gal 11/25/2016   SCC (squamous cell carcinoma) 04/08/2014   ONSET DATE: 11/29/2017  REFERRING DIAG: Central Cord Syndrome at C6 of the Cervical Spinal Cord, Fall with Bilateral Closed Hip Fractures, with ORIF repair with Intramedullary Nailing of the Left Hip Fracture.   THERAPY DIAG:  Muscle weakness (generalized)  Other lack of coordination  Rationale for Evaluation and Treatment: Rehabilitation  SUBJECTIVE:   SUBJECTIVE STATEMENT:  Pt. reports doing well today  Pt accompanied by: self, Personal Care  aide  PERTINENT HISTORY: Central Cord Syndrome at C6 of the Cervical Spinal Cord, Fall with Bilateral Closed Hip Fractures, with ORIF repair with Intramedullary Nailing of the Left Hip Fracture.   PRECAUTIONS: None  WEIGHT BEARING RESTRICTIONS: No  PAIN:  Are you having pain? No pain, just shoulder stiffness   LIVING ENVIRONMENT: Lives with: [facility]  Stairs: No Has following equipment at home: Wheelchair (power)  PLOF:  Independent   PATIENT GOALS:  See below for established goals  OBJECTIVE:  Note: Objective measures were completed at Evaluation unless otherwise noted.  HAND DOMINANCE: Right  ADLs: Caregiver assist with ADLs, and [facility]  MOBILITY STATUS: Uses a power w/c  ACTIVITY TOLERANCE: Activity tolerance: Fair  FUNCTIONAL OUTCOME MEASURES:  Measurements:   12/09/2021:   Shoulder flexion: R: 120(136), L: 138(150) Shoulder abduction: R: 108(120), L: 110(120) Elbow: R: 0-148, L: 0-146 Wrist flexion: R: 55(62), L: 78 Writ extension: R: 34(55), L: 3(4) RD: R: 12(20), L: 16(26) UD: R: 8(24), L: 6(18) Thumb radial abduction: R: 11(20), L: 8(20) Digit flexion to the [facility]: R:  2nd: 5cm(3.5cm), 3rd: 7.5 cm(5cm), 4th: 7cm(3cm), 5th: 6cm(5.5cm) L: 2nd: 8cm(8cm), 3rd: 8cm(8cm), 4th: 7.5cm(7cm), 5th: 7cm(7cm)   02/03/2022:   Shoulder flexion: R: 122(138), L: 148(154) Shoulder abduction: R: 109(125), L: 125(125 Elbow: R: 0-148, L: 0-146 Wrist flexion: R: 60(68), L: 79 Writ extension: R: 40(55), L: 3(10) RD: R: 14(24), L: 20(28) UD: R: 8(24), L: 6(18) Thumb radial abduction: R: 20(25), L: 8(20) Digit flexion to the [facility]: R:  2nd: 4.5cm(3cm), 3rd: 6.5cm(4.5cm), 4th: 6.5 cm(3cm), 5th: 4.5cm(5cm) L: 2nd: 8cm(8cm), 3rd: 8cm(7cm), 4th: 7.5cm(7cm), 5th: 6.5cm(6.5cm)   04/07/2022:   Shoulder flexion: R: 125(150), L: 160(160) Shoulder abduction: R: 109(125), L: 125(125 Elbow: R: 0-148, L: 0-146 Wrist flexion: R: 60(68), L: 79 Writ extension: R: 40(55), L: 5(18) RD: R: 14(24), L: 20(28) UD: R: 18(24), L: 8(18) Thumb radial abduction: R: 20(25), L: 8(20) Digit flexion to the [facility]: R:  2nd: 4.5 cm(4 cm), 3rd: 6.5cm(3cm), 4th: 7 cm (5cm), 5th: 5cm(5cm) L: 2nd: 8cm(8cm), 3rd: 8cm(7cm), 4th: 7.5cm(7cm), 5th: 7cm(7cm)   07/05/2022:   Shoulder flexion: R: 125(154), L: 160(160) Shoulder abduction: R: 95(130), L: 143(143) Elbow: R: 0-148, L: 0-146 Wrist flexion: R: 60(68), L: 79 Writ  extension: R: 40(60), L: 0(12) RD: R: 10(24), L: 12(20) UD: R: 16(24), L: 8(16) Thumb radial abduction: R: 20(25), L: 8(20) Digit flexion to the [facility]: R:  2nd: 4 cm(4cm), 3rd: 6.5cm( 3.5cm), 4th: 7 cm (5cm), 5th: 6cm(5cm) L: 2nd: 8cm(8cm), 3rd: 8cm(7cm), 4th: 8cm(7cm), 5th: 7cm(7cm)   09/13/2022:   Shoulder flexion: R: 125(154), L: 160(160) Shoulder abduction: R: 100(130), L: 143(143) Elbow: R: 0-150, L: 0-150 Wrist flexion: R: 55(68), L: 79 Writ extension: R: 45(60), L: 0(12) RD: R: 10(24), L: 12(20) UD: R: 16(24), L: 8(16) Thumb radial abduction: R: 20(25), L: 8(20) Digit flexion to the [facility]: R:  2nd: 4 cm(4cm), 3rd: 6cm( 5cm), 4th: 6 cm (4cm), 5th: 5cm(5cm) L: 2nd: 7cm(cm), 3rd: 7cm(6cm), 4th: 7cm(6cm), 5th: 6cm(6cm)   10/27/2022   Shoulder flexion: R: 128(154), L: 160(160) Shoulder abduction: R: 105(130), L: 143(143) Elbow: R: 0-150, L: 0-150 Wrist flexion: R: 55(68), L: 79 Writ extension: R: 46(64), L: 0(20) RD: R: 10(20), L: 18(24) UD: R: 18(28), L: 8(20) Thumb radial abduction: R: 30(42), L:20(24) Digit flexion to the [facility]: R:  2nd: 4 cm(3cm), 3rd: 6cm( 6cm), 4th: 6 cm (5cm), 5th: 5cm(5cm) L: 2nd: 8cm(8cm), 3rd: 8cm(6cm), 4th: 8cm(7cm), 5th:  6cm(6cm)  01/19/2023  Shoulder flexion: R: 131(154), L: 160(160) Shoulder abduction: R: 110(130), L: 143(143) Elbow: R: 0-150, L: 0-150 Wrist flexion: R: 55(68), L: 79 Writ extension: R: 46(64), L: 0(20) RD: R: 10(20), L: 18(24) UD: R: 18(28), L: 14(20) Thumb radial abduction: R: 38(48), L:24(26) Thumb IP flexion: R: 35(50) L: 20(45) Digit flexion to the [facility]: R:  2nd: 3.5 cm(3cm), 3rd: 6cm( 6cm), 4th: 6 cm (5cm), 5th: 5cm(4.5cm) L: 2nd: 8cm(7.5cm), 3rd: 8cm(6cm), 4th: 7.5cm(6cm), 5th: 6cm(4.5cm)  04/04/2023  Shoulder flexion: R: 134(154), L: 160(160) Shoulder abduction: R: 114(130), L: 143(143) Elbow: R: WNL, L: WNL Wrist flexion: R: 55(68), L: 80 Writ extension: R: 46(64), L: -10(10) RD: R: 14(24), L: 20(24) UD: R:  20(30), L: 14(20) Thumb radial abduction: R: 38(48), L:28(30) Thumb IP flexion: R: 40(50) L: 25(45) Digit flexion to the [facility]: R:  2nd: 3.5 cm(3cm), 3rd: 6cm( 5cm), 4th: 6 cm (4cm), 5th: 5cm(5cm) L: 2nd: 8cm(7.5cm), 3rd: 8cm(7cm), 4th: 7.5cm(7cm), 5th: 7cm(6cm)   COORDINATION:   01/19/2023:  9 hole peg test:  Right: 2 pegs placed in 1 min. & 5 sec.  Pt. was able to remove 9 pegs in 17 sec.  Left: 2 pegs placed in 1 min. & 10 sec. Pt. was able to remove 9 pegs in 20 sec.  04/04/23:  Pt. was able to consistently grasp the pegs however each one slipped out of her fingers when attempting to place them into the Pegboard.  SENSATION: Intact  EDEMA: N/A   COGNITION: Overall cognitive status: WNL   VISION: Subjective report: Wears glasses, No changes in vision.  PERCEPTION: Intact  TODAY'S TREATMENT:                                                                                                                              DATE:  04/04/23   Measurements were completed, and goals were reviewed with the Pt.  PATIENT EDUCATION:  Education details: Bilateral hand function, ROM, and strengthening, reaching, and [facility]  Person educated: Patient Education method: Explanation, Demonstration, and Verbal cues Education comprehension: verbalized understanding and returned demonstration  HOME EXERCISE PROGRAM: Continue to assess HEP needs, and provide as needed.   GOALS: Goals reviewed with patient? Yes  LONG TERM GOALS: Target date: 04/13/2023  3.  Patient will demonstrate improved composite finger flexion to be able to firmly hold and use adaptive devices during ADLs, and IADLs.  Baseline: 04/04/2023: Pt. continues to work on improving Digit flexion to the [facility] to be able to efficiently handle Adaptive equipment. 01/19/2023: Pt. Is improving with Bilateral digit flexion to the [facility]. Pt. has difficulty firmly holding adaptive devices. 12/06/2022: Pt. continues to present with increased MP,  PIP, and DIP digit tightness/stiffness limiting the formulation of bilateral composites fists. 10/27/2022: Pt. presents with increased MP, PIP, and DIP digit tightness/stiffness limiting the formulation of bilateral composites fists during this progress reporting period limiting. Digit flexion to the [facility]: R:  2nd: 4 cm(3cm), 3rd:  6cm( 6cm), 4th: 6 cm (5cm), 5th: 5cm(5cm), L: 2nd: 8cm(8cm), 3rd: 8cm(6cm), 4th: 8cm(7cm), 5th: 6cm(6cm) 09/20/2022: Improving digit flexion to the [facility] 09/13/22: improved digit flexion to the [facility]. 07/05/2022: Pt. presents with digit MP, PIP, and DIP extensor tightness limiting her ability to securely grip objects in her bilateral hands.  04/07/2022: Pt. Has improved with right 2nd, and 3rd digit flexion towards the [facility]. Pt. Continues to have difficulty securely holding and applying deodorant.02/03/2022: Pt. Has improved with digit flexion, however, continues to have difficulty securely holding and using deodorant. 12/09/2021: Bilateral hand/digit MP, PIP, and DIP extension tightness limits her ability achieve digit flexion to hold, and apply deodorant. 10/28/2021:  Pt. continues to have difficulty holding the deodorant. 01/12/2021: Pt. Presents with limited digit extension. Pt. Is able to initiate holding deodorant, however is unable to hold it while using it  Goal status: Ongoing   4.  Pt. Will improve bilateral wrist extension in preparation for anticipating, and initiating reaching for objects at the table.  Baseline: 04/04/23: R: 46(64) L: -10(10)01/19/2023:  R: 46(64) L: 0(20) 12/06/2022: Pt continues to to progress with bilateral wrist extension in preparation fro functional reaching. 10/27/2022: Pt. Is improving with bilateral wrist extension. R: 46(64) L: 0(20) 09/20/2022: limited PROM in the Left wrist extension 2/2 tightness/stiffness. 09/13/22: R: 55(68), L: 0(12) 07/19/2022: Bilateral wrist extension in limited. 07/05/2022: Right: 40(60) Left: 0(12) 04/07/2022: Right: 40(55) Left: 5(18)  02/03/2022: Right: 34(55) Left: 3(4) 12/09/2021: Right  34(55)  Left  3(4) 10/28/2021:  Right 22(38), Left 0(15) 09/14/2021:  Right: 17(35), left 2(15)  Goal status: Left wrist tightness noted. Ongoing  7.  Pt. Will increase bilateral lateral pinch strength by 2 lbs to be able to securely grasp items during ADLs, and IADL tasks.  Baseline: 01/19/2023: Deferred as the Pinch meter is out for calibration.12/06/2022: Pt. Is able to more securely hold objects during ADLs/IADLs 10/27/2022: Pt. Is improving with holding items with her bilateral thumbs. (Pinch meter out for calibration) 09/20/2022: lateral pinch continues to be limited 09/13/22: R 6.5# L 4# 07/19/2022: TBD 07/05/2022: TBD 07/2022: NT-Pinch meter out for calibration. 02/03/2022: Right: 6#, Left: 4# 12/09/2021: Right: 6#, Left: 4#  Goal status: Deferred   9.  Pt. will complete plant care with modified independence.  Baseline:12/06/2022: Pt. Is independent with plant care in her husband's room, however is not able to access her plants due to furniture blocking access to them. 10/27/2022: Pt. Is able to water plants that are closer, and within reach. Pt. continues to have difficulty reaching for thorough plant care. 09/20/2022: Pt. Continues to have difficulty reaching for thorough plant care. 09/13/22: continues to report intermittent difficulty 07/19/2022: Pt. Continues to be able to water, and care for some of her plants. Pt. Has more difficulty with plants that are harder to reach. 07/05/2022: pt. Is able to water, and care for some of her plants. Pt. Has more difficulty with plants that are harder to reach. 04/07/2022: Pt. is now able to hold a cup and water her plants.02/03/2022: Pt. has difficulty caring for her plants.  Goal status:Achieved   10.  Pt. will demonstrate adaptive techniques to assist with the efficiency of self-dressing, or morning care tasks.  Baseline: 04/04/2023:  Pt. continues to work on improving UE functioning to be able to efficiently  use Adaptive equipment. 01/19/2203: Pt. Is able to donn her jacket in reverse independently. Pt. requires assist from staff, as Pt. Reports staff have decreased time. 12/06/2022: Pt. requires assist from staff, as Pt.  Reports staff have decreased time. 10/27/2022: Pt. is able to assist with initiating UE dressing. Pt. requires assist from personal, and staff care aides. 09/20/2022: Pt. continues to require assist form personal, and staff care aides. 09/13/22: MODA dressing, reports she is often rushed 07/19/2022: Pt. continues to require assist with self-dressing/morning care tasks. 07/05/2022: Continue with goal. 04/07/2022: Pt. Continues to require assist with the efficiency of self-dressing, and morning care tasks.02/03/2022: Pt. requires assist from caregivers 2/2 time limitations during morning care.  Goal status: Ongoing  11.  Pt. will improve BUE strength to improve ADL, and IADL functioning.  Baseline: 04/04/2023: shoulder flexion L 4/5, R 3+/5; shoulder abduction L 4+/5, R 3+/5. elbow flexion B 5/5, elbow extension 4/5 01/19/2023: L 4/5, R 3+/5 12/06/2022: Continue 10/27/2022: shoulder flexion L 4/5, R 3+/5; shoulder abduction L 4+/5, R 3+/5. elbow flexion B 5/5, elbow extension 4/5 09/20/2022: Pt. Presents with limited BUE strength 09/13/22: shoulder flexion L 4/5, R 3/5; elbow flexion B 5/5, elbow extension 4-/5; shoulder abduction L 4+/5, R 3+/5. 07/19/2022: BUE strength continues to be limited. 07/05/2022: shoulder flexion: right 4-/5, abduction: 3+/5, elbow flexion: right: 5/5, left 5/5, extension: right: 4-/5, left 4-/5, wrist extension: right: 3-/5, left: 2-/5  Goal status: Ongoing  12.  Pt. will improve bilateral thumb radial abduction in order to be able to hold the grab bars while standing with PT  Baseline: 04/04/23: R: 38(48), V:56(43) 01/19/2023: R: 38(48) L: 24(26)12/06/2022: Pt. Continues to present  with limited thumb abduction, however is improving holding onto the parallel bars.10/27/2022: thumb  radial abduction: R: 30(42), L: 20(24  Goal status: Ongoing  13.  Pt. Will be able to securely hold, and stabilize medication bottles at a tabletop surface when opening, and closing them.  Baseline: 01/19/2023: Pt. Is now able to securely hold medication bottles while opening them, and stabilizes them on surfaces while opening them. 12/06/2022: Pt is improving with securely stabilizing medication bottles. 10/27/2022: Pt. Stabilizes bottles against her torso when attempting to open, and close them   Goal status: Achieved  14: Pt. Will improve bilateral thumb IP flexion to improve active grasping patterns.    Baseline: 04/04/2023:  R: 40(50) L: 25(45) 01/19/2023: Right 35(50), Left: 20/45)    Goal status: Ongoing    15. Pt. Will improve bilateral FMC/speed, and dexterity. As evidence by improved scores on the 9 hole peg test.  Baseline: 04/04/23: Pt. was able to consistently grasp the pegs however each one slipped out of her fingers when attempting to place them into the Pegboard. 01/19/2023: 2 pegs placed in 1 min. & 5 sec.  Pt. was able to remove 9 pegs in 17 sec.; Left: 2 pegs placed in 1 min. & 10 sec. Pt. was able to remove 9 pegs in 20 sec.   Goal status: Ongoing      ASSESSMENT:  CLINICAL IMPRESSION:  Pt. has made progress over this recertification period with left radial deviation, left radial thumb abduction, and bilateral thumb IP flexion. Pt. Continue to engage her hand more during ADLs, and IADL tasks. Pt. Has had multiple cancellations due to illness, and transportation over this recertification period. Pt. Continues to be highly motivated to improve BUE strength, and overall functioning.  Pt. continues to benefit form OT services to work on impoving ROM, and UE strength in order to work towards increasing bilateral hand grasp on objects, and increasing engagement of bilateral hands during ADLs, and IADL tasks.    PERFORMANCE DEFICITS: in functional skills including ADLs, IADLs,  coordination, dexterity, ROM, strength, and UE functional use, cognitive skills including , and psychosocial skills including coping strategies and environmental adaptation.   IMPAIRMENTS: are limiting patient from ADLs, IADLs, and leisure.   CO-MORBIDITIES: may have co-morbidities  that affects occupational performance. Patient will benefit from skilled OT to address above impairments and improve overall function.  MODIFICATION OR ASSISTANCE TO COMPLETE EVALUATION: Min-Moderate modification of tasks or assist with assess necessary to complete an evaluation.  OT OCCUPATIONAL PROFILE AND HISTORY: Detailed assessment: Review of records and additional review of physical, cognitive, psychosocial history related to current functional performance.  CLINICAL DECISION MAKING: Moderate - several treatment options, min-mod task modification necessary  REHAB POTENTIAL: Good for stated goals  PLAN:  OT FREQUENCY 2x's a week  OT DURATION: 12 weeks  PLANNED INTERVENTIONS ADL training, A/E training, UE ther. Ex, Manual therapy, neuromuscular re-education, moist heat modality, Paraffin Bath, Splinting, and  Pt./caregiver education   RECOMMENDED OTHER SERVICES: PT  CONSULTED AND AGREED WITH PLAN OF CARE: Patient  PLAN FOR NEXT SESSION: Continue Treatment as per established POC  Olegario Messier, MS, OTR/L  04/04/23, 12:52 PM

## 2023-04-04 NOTE — Therapy (Signed)
 OUTPATIENT PHYSICAL THERAPY NEURO TREATMENT    Patient Name: Sherry [name] MRN: [id number] DOB:Jun 14, 1934, 89 y.o., female Today's Date: 04/05/2023   PCP: Earnestine Mealing, MD REFERRING PROVIDER: Deeann Saint   PT End of Session - 04/04/23 1155     Visit Number 101    Number of Visits 124    Date for PT Re-Evaluation 06/20/23    Authorization Type [employer] FOTO performed by PT on eval (7/20), score 12, Progress note on 03/10/2020; PN on 08/20/2020    Authorization Time Period Initial cert [id number]/62/1308-65/78/4696; Recert 01/11/2022-04/05/2022; Recert 04/07/2022-06/30/2022    Progress Note Due on Visit 100    PT Start Time 1145    PT Stop Time 1228    PT Time Calculation (min) 43 min    Equipment Utilized During Treatment Gait belt    Activity Tolerance Patient tolerated treatment well    Behavior During Therapy WFL for tasks assessed/performed                    Past Medical History:  Diagnosis Date   Acute blood loss anemia    Arthritis    Cancer (HCC)    skin   Central cord syndrome at C6 level of cervical spinal cord (HCC) 11/29/2017   Hypertension    Protein-calorie malnutrition, severe (HCC) 01/24/2018   S/P insertion of IVC (inferior vena caval) filter 01/24/2018   Tetraparesis (HCC)    Past Surgical History:  Procedure Laterality Date   ANTERIOR CERVICAL DECOMP/DISCECTOMY FUSION N/A 11/29/2017   Procedure: Cervical five-six, six-seven Anterior Cervical Decompression Fusion;  Surgeon: Jadene Pierini, MD;  Location: [facility] OR;  Service: Neurosurgery;  Laterality: N/A;  Cervical five-six, six-seven Anterior Cervical Decompression Fusion   CATARACT EXTRACTION     FEMUR IM NAIL Left 08/16/2021   Procedure: INTRAMEDULLARY (IM) NAIL FEMORAL;  Surgeon: Deeann Saint, MD;  Location: [facility] ORS;  Service: Orthopedics;  Laterality: Left;   IR IVC FILTER PLMT / S&I /IMG GUID/MOD SED  12/08/2017   Patient Active Problem List   Diagnosis Date Noted   Age-related  osteoporosis without current pathological fracture 07/27/2022   Mild recurrent major depression (HCC) 07/27/2022   Hypertensive heart disease with other congestive heart failure (HCC) 02/14/2022   Chronic indwelling Foley catheter 02/14/2022   Closed hip fracture (HCC) 08/15/2021   DVT (deep venous thrombosis) (HCC) 08/15/2021   Quadriplegia (HCC) 08/15/2021   Fall 08/15/2021   Constipation due to slow transit 08/31/2018   Trauma 06/05/2018   Neuropathic pain 06/05/2018   Neurogenic bowel 06/05/2018   Vaginal yeast infection 01/30/2018   Healthcare-associated pneumonia 01/25/2018   Chronic allergic rhinitis 01/24/2018   Depression with anxiety 01/24/2018   UTI due to Klebsiella species 01/24/2018   Protein-calorie malnutrition, severe (HCC) 01/24/2018   S/P insertion of IVC (inferior vena caval) filter 01/24/2018   Tetraparesis (HCC) 01/20/2018   Neurogenic bladder 01/20/2018   Reactive depression    Benign essential HTN    Acute postoperative anemia due to expected blood loss    Central cord syndrome at C6 level of cervical spinal cord (HCC) 11/29/2017   Allergy to alpha-gal 11/25/2016   SCC (squamous cell carcinoma) 04/08/2014    ONSET DATE: 08/15/2021 (fall with B hip fx); Initial injury was in 2019- quadraplegia due to central cord syndrome of C6 secondary to neck fracture.   REFERRING DIAG: Bilateral Hip fx; Left Open- s/p Left IM nail femoral on 08/16/2021; Right closed  THERAPY DIAG:  Muscle weakness (generalized)  Other  lack of coordination  Central cord syndrome, sequela (HCC)  Other abnormalities of gait and mobility  Difficulty in walking, not elsewhere classified  Closed fracture of both hips, initial encounter (HCC)  Abnormality of gait and mobility  Unsteadiness on feet  Repeated falls  Rationale for Evaluation and Treatment Rehabilitation  SUBJECTIVE:   SUBJECTIVE STATEMENT:   Patient reports doing okay today. Caregiver states some rubbing with  brace.       Pt accompanied by:  Paid caregiver-   PERTINENT HISTORY: Sherry [name] is an 87yoF who experienced a fall at home on 08/15/2021 with hip fracture, s/p Left hip ORIF. PMH: quadriplegia due to central cord syndrome of C6 secondary to neck fracture, HTN, depression with anxiety, neurogenic bladder, bilateral DVT on Xarelto (s/p of IVC filter placement). Prior history of significant PT/OT with improvement of function. Pt has full use of shoulder/elbows, limited use of hands, adaptive self feeding sucessful.     PAIN:  None current but does endorse some left thigh pain intermittent  OBJECTIVE:    TODAY'S TREATMENT:   04/04/2023   Therapeutic activities: Standing in // bars x 1 min 35 sec Standing in // bars x 45 sec (stopped secondary to noticed pressure and increased discoloration of toes with weight bearing)   Therex:   Seated resistive hip flex YTB 2 x 10 reps each LE Seated resistive hip ext YTB 2 x 10 reps each LE Seated LAQ (active) 3 sets of 10 reps Seated resistive ham curls YTB 2 x 12 reps  Hip add (ball squeeze) 3 sec 2 x 10 reps    PATIENT EDUCATION: Education details: Exercise technique, Discussion of how to obtain order/F2F for a new brace Patient verbalized understanding.   HOME EXERCISE PROGRAM: No changes at this time  Access Code: [facility] URL: [email or web address] Date: 11/23/2021 Prepared by: Precious Bard Exercises - Seated Gluteal Sets - 2 x daily - 7 x weekly - 2 sets - 10 reps - 5 hold - Seated Quad Set - 2 x daily - 7 x weekly - 2 sets - 10 reps - 5 hold - Seated Long Arc Quad - 2 x daily - 7 x weekly - 2 sets - 10 reps - 5 hold - Seated March - 2 x daily - 7 x weekly - 2 sets - 10 reps - 5 hold - Seated Hip Abduction - 2 x daily - 7 x weekly - 2 sets - 10 reps - 5 hold - Seated Shoulder Shrugs - 2 x daily - 7 x weekly - 2 sets - 10 reps - 5 hold - Wheelchair Pressure Relief - 2 x daily - 7 x weekly - 2 sets - 10 reps - 5  hold  GOALS: Goals reviewed with patient? Yes  SHORT TERM GOALS: Target date: 02/22/2022  Pt will be independent with initial UE strengthening HEP in order to improve strength and balance in order to decrease fall risk and improve function at home and work. Baseline: 10/19/2021- patient with no formal UE HEP; 12/02/2021= Patient verbalized knowledge of HEP including use of theraband for UE strengthening.  Goal status: GOAL MET  LONG TERM GOALS: Target date: 06/20/2023  Pt will be independent with final for UE/LE HEP in order to improve strength and balance in order to decrease fall risk and improve function at home and work. Baseline: Patient is currently BLE NWB and unable to participate in HEP. Has order for UE strengthening. 01/11/2022- Patient now able to participate in LE strengthening although  NWB still- good understanding for some basic exercises. Will keep goal active to incorporate progressive LE strengthening exercises. 04/07/2022= Patient still participating in progressive LE seated HEP and has no questions at this time.  Goal status: MET  2.  Pt will improve FOTO to target score of 35  to display perceived improvements in ability to complete ADL's.  Baseline: 10/19/2021= 12; 12/02/2021= 17; 01/11/2022=17; 04/07/2022= 17; 07/05/2022=17; 10/06/2022- outcome measure not appropriate as patient in non-ambulatory and not consistently standing. Goal status: Goal not appropriate  3.  Pt will increase strength of B UE  by at least 1/2 MMT grade in order to demonstrate improvement in strength and function  Baseline: patient has range of 2-/5 to 4/5 BUE Strength; 12/02/2021= 4/5 except with wrist ext. 01/11/2022= 4/5 B UE strength expept for wrist Goal status: Goal revised-no longer appropriate- working with OT on all UE strengthening.   4. Pt. Will increase strength of RLE by at least 1/2 MMT grade in order to demonstrate improvement in Standing/transfers.  Baseline: 2-/5 Right hip flex/knee ext/flex;  04/07/2022= 2- with right hip flex/knee ext (lacking 28 deg from zero) 4/8= Patient able to ext right knee lacking 18 deg from zero. 07/05/2022=Left knee approx 8 deg from zero and right knee = 23 deg from zero; 12/29/2022 - Left LE = full ROM 4-/5 and right knee ext= 23 deg from zero= 2+/5 (gravity minimized); 03/28/2023= Left knee ext=  4/5 and right knee ext= 3-/5 (able to move lower leg through incomplete ROM -lacking 20 deg from full ROM against gravity)  Goal status: Progressing  5. Pt. Will demo ability to stand pivot transfer with max assist for improved functional mobility and less dependent need on mechanical device.   Baseline: Dependent on hoyer lift for all transfers; 04/07/2022- Unable to test secondary to patient with recent UTI and right hand procedure - will attempt to reassess next visit. ; 04/12/22: Unable to successfully stand due to feet plates on loaned power w/c; 05/10/2022- Patient still having to use rental chair and has not received her original power w/c back to practice SPT. 07/04/2021= Patient just received new power w/c and attempted standing from 1st time today- Patient able to stand with max assist today from w/c and did not attempt to perform pivot transfer- difficulty with static standing and placing weight on right LE. 10/06/2022- Not assessed today- patient needs new AFO prior to attempting transfers; 12/29/2022= Patient has not attempted SPT yet - still trying to improve LE strength enough to stand well without knee buckling with weight shifting. 03/28/2023- not attempted today due to ill fitting brace but patient last performed SPT on 01/10/23 and was able to perform with Max A with feet pre-positioned to angle toward direction of transfer  Goal status: ONGOING  6. Pt. Will demonstrate improved functional LE strength as seen by ability to stand > 2 min for improved transfer ability and pregait abilities.   Baseline: not assessed today due to recent dx: UTI; 04/07/2022- Unable to test  secondary to patient with recent UTI and right hand procedure - will attempt to reassess next visit; 04/12/22: Unable to successfully stand due to feet plates on loaned power w/c 05/10/2022- Patient still having to use rental chair and has not received her original power w/c back to practice Standing. 07/05/2022- Patient able to stand with max assist at trunk today for 2 min 3 sec. Will keep goal active to ensure consistency. 10/06/2022= Unable to assess today- patient with some recent blood pressure  issues and needs new AFO. 11/24/2022= Patient demonstrated multiple rounds of standing in // bars- holding up to 1:45 min today but has been able to exhibit 2 min in recent past- working on consistency. 12/29/2022= 2 min 40 sec with patient demonstrating improved overall posture and ability to contract glutes and quads with just min assist at trunk today.   Goal status: MET  7. Pt. Will demonstrate improved functional LE strength as seen by ability to stand > 5 min for improved transfer ability and pregait abilities. Baseline: 12/29/2022= 2 min 40 sec with patient demonstrating improved overall posture and ability to contract glutes and quads with just min assist at trunk today. 03/28/2023= Attempted 2 trials today - at best 2 min 48 sec today but during cert- did stand for 3 min 44 sec.  Goal Status: Ongoing- Patient will likely receive new AFO in upcoming weeks and anticipate more standing time once received.  8. Patient will perform sit to stand transfer with moderate assist consistently > 75% of time for improved transfer ability and less dependence on caregiver.   Baseline: 12/29/2022- Patient currently max assist level assistance with all sit to stand activities. 03/28/2023= Patient is still majority of max assist to stand yet able to pull up from wheelchair smoother in one motion.   Goal status: ONGOING   ASSESSMENT:  CLINICAL IMPRESSION:   Patient presents with good motivation today. Treatment was modified  after attempted standing cause increased pressure to left toes/foot in current brace. Patient then responded well overall with seated strengthening- able to perform some resistive strengthening but continues to be most limited by overall weakness (RLE significant weaker than LLE) .   Pt will continue to benefit from skilled PT services to optimize independence and reduced caregiver support.   Will focus more on transfers, Sit to stand and standing once patient receives AFO.   OBJECTIVE IMPAIRMENTS Abnormal gait, decreased activity tolerance, decreased balance, decreased coordination, decreased endurance, decreased mobility, difficulty walking, decreased ROM, decreased strength, hypomobility, impaired flexibility, impaired UE functional use, postural dysfunction, and pain.   ACTIVITY LIMITATIONS carrying, lifting, bending, sitting, standing, squatting, sleeping, stairs, transfers, bed mobility, continence, bathing, toileting, dressing, self feeding, reach over head, hygiene/grooming, and caring for others  PARTICIPATION LIMITATIONS: meal prep, cleaning, laundry, medication management, personal finances, interpersonal relationship, driving, shopping, community activity, and yard work  PERSONAL FACTORS Age, Time since onset of injury/illness/exacerbation, and 1-2 comorbidities: HTN, cervical Sx  are also affecting patient's functional outcome.   REHAB POTENTIAL: Good  CLINICAL DECISION MAKING: Evolving/moderate complexity  EVALUATION COMPLEXITY: Moderate  PLAN: PT FREQUENCY: 1-2x/week  PT DURATION: 12 weeks  PLANNED INTERVENTIONS: Therapeutic exercises, Therapeutic activity, Neuromuscular re-education, Balance training, Gait training, Patient/Family education, Self Care, Joint mobilization, DME instructions, Dry Needling, Electrical stimulation, Wheelchair mobility training, Spinal mobilization, Cryotherapy, Moist heat, and Manual therapy  PLAN FOR NEXT SESSION:  LE and core strength training,  Sit to stand and static standing; progress to SPT as able.    7:13 AM, 04/05/23 Louis Meckel, PT Physical Therapist - [place] [facility]  Outpatient Physical Therapy- Main Campus [phone]

## 2023-04-06 ENCOUNTER — Ambulatory Visit: Payer: Medicare HMO | Admitting: Occupational Therapy

## 2023-04-06 ENCOUNTER — Ambulatory Visit: Payer: Medicare HMO

## 2023-04-11 ENCOUNTER — Ambulatory Visit: Payer: Medicare HMO | Admitting: Occupational Therapy

## 2023-04-11 ENCOUNTER — Ambulatory Visit: Payer: Medicare HMO

## 2023-04-11 DIAGNOSIS — R2689 Other abnormalities of gait and mobility: Secondary | ICD-10-CM

## 2023-04-11 DIAGNOSIS — S14129S Central cord syndrome at unspecified level of cervical spinal cord, sequela: Secondary | ICD-10-CM

## 2023-04-11 DIAGNOSIS — M6281 Muscle weakness (generalized): Secondary | ICD-10-CM

## 2023-04-11 DIAGNOSIS — R278 Other lack of coordination: Secondary | ICD-10-CM

## 2023-04-11 DIAGNOSIS — R262 Difficulty in walking, not elsewhere classified: Secondary | ICD-10-CM

## 2023-04-11 DIAGNOSIS — R2681 Unsteadiness on feet: Secondary | ICD-10-CM

## 2023-04-11 DIAGNOSIS — R269 Unspecified abnormalities of gait and mobility: Secondary | ICD-10-CM

## 2023-04-11 DIAGNOSIS — S72001A Fracture of unspecified part of neck of right femur, initial encounter for closed fracture: Secondary | ICD-10-CM

## 2023-04-11 NOTE — Therapy (Addendum)
 Occupational Therapy Neuro Treatment Note          Patient Name: Sherry [name] MRN: [id number] DOB:September 01, 1934, 89 y.o., female Today's Date: 11/29/2022  PCP:  Cindi Carbon PROVIDER: Earnestine Mealing, MD  END OF SESSION:   OT End of Session - 04/11/23 1214     Visit Number 111    Number of Visits 192    Date for OT Re-Evaluation 06/27/23    Authorization Time Period Progress report period starting 0812/2024    Progress Note Due on Visit 10    OT Start Time 1100    OT Stop Time 1143    OT Time Calculation (min) 43 min    Equipment Utilized During Treatment powered wheelchair    Activity Tolerance Patient tolerated treatment well    Behavior During Therapy WFL for tasks assessed/performed                     Past Medical History:  Diagnosis Date   Acute blood loss anemia    Arthritis    Cancer (HCC)    skin   Central cord syndrome at C6 level of cervical spinal cord (HCC) 11/29/2017   Hypertension    Protein-calorie malnutrition, severe (HCC) 01/24/2018   S/P insertion of IVC (inferior vena caval) filter 01/24/2018   Tetraparesis (HCC)    Past Surgical History:  Procedure Laterality Date   ANTERIOR CERVICAL DECOMP/DISCECTOMY FUSION N/A 11/29/2017   Procedure: Cervical five-six, six-seven Anterior Cervical Decompression Fusion;  Surgeon: Jadene Pierini, MD;  Location: [facility] OR;  Service: Neurosurgery;  Laterality: N/A;  Cervical five-six, six-seven Anterior Cervical Decompression Fusion   CATARACT EXTRACTION     FEMUR IM NAIL Left 08/16/2021   Procedure: INTRAMEDULLARY (IM) NAIL FEMORAL;  Surgeon: Deeann Saint, MD;  Location: [facility] ORS;  Service: Orthopedics;  Laterality: Left;   IR IVC FILTER PLMT / S&I /IMG GUID/MOD SED  12/08/2017   Patient Active Problem List   Diagnosis Date Noted   Age-related osteoporosis without current pathological fracture 07/27/2022   Mild recurrent major depression (HCC) 07/27/2022   Hypertensive heart disease with other  congestive heart failure (HCC) 02/14/2022   Chronic indwelling Foley catheter 02/14/2022   Closed hip fracture (HCC) 08/15/2021   DVT (deep venous thrombosis) (HCC) 08/15/2021   Quadriplegia (HCC) 08/15/2021   Fall 08/15/2021   Constipation due to slow transit 08/31/2018   Trauma 06/05/2018   Neuropathic pain 06/05/2018   Neurogenic bowel 06/05/2018   Vaginal yeast infection 01/30/2018   Healthcare-associated pneumonia 01/25/2018   Chronic allergic rhinitis 01/24/2018   Depression with anxiety 01/24/2018   UTI due to Klebsiella species 01/24/2018   Protein-calorie malnutrition, severe (HCC) 01/24/2018   S/P insertion of IVC (inferior vena caval) filter 01/24/2018   Tetraparesis (HCC) 01/20/2018   Neurogenic bladder 01/20/2018   Reactive depression    Benign essential HTN    Acute postoperative anemia due to expected blood loss    Central cord syndrome at C6 level of cervical spinal cord (HCC) 11/29/2017   Allergy to alpha-gal 11/25/2016   SCC (squamous cell carcinoma) 04/08/2014   ONSET DATE: 11/29/2017  REFERRING DIAG: Central Cord Syndrome at C6 of the Cervical Spinal Cord, Fall with Bilateral Closed Hip Fractures, with ORIF repair with Intramedullary Nailing of the Left Hip Fracture.   THERAPY DIAG:  Muscle weakness (generalized)  Other lack of coordination  Rationale for Evaluation and Treatment: Rehabilitation  SUBJECTIVE:   SUBJECTIVE STATEMENT:  Pt. reports doing well today  Pt accompanied by:  self, Personal Care aide  PERTINENT HISTORY: Central Cord Syndrome at C6 of the Cervical Spinal Cord, Fall with Bilateral Closed Hip Fractures, with ORIF repair with Intramedullary Nailing of the Left Hip Fracture.   PRECAUTIONS: None  WEIGHT BEARING RESTRICTIONS: No  PAIN:  Are you having pain? No pain, just shoulder stiffness   LIVING ENVIRONMENT: Lives with: [facility]  Stairs: No Has following equipment at home: Wheelchair (power)  PLOF:  Independent   PATIENT GOALS:  See below for established goals  OBJECTIVE:  Note: Objective measures were completed at Evaluation unless otherwise noted.  HAND DOMINANCE: Right  ADLs: Caregiver assist with ADLs, and [facility]  MOBILITY STATUS: Uses a power w/c  ACTIVITY TOLERANCE: Activity tolerance: Fair  FUNCTIONAL OUTCOME MEASURES:  Measurements:   12/09/2021:   Shoulder flexion: R: 120(136), L: 138(150) Shoulder abduction: R: 108(120), L: 110(120) Elbow: R: 0-148, L: 0-146 Wrist flexion: R: 55(62), L: 78 Writ extension: R: 34(55), L: 3(4) RD: R: 12(20), L: 16(26) UD: R: 8(24), L: 6(18) Thumb radial abduction: R: 11(20), L: 8(20) Digit flexion to the [facility]: R:  2nd: 5cm(3.5cm), 3rd: 7.5 cm(5cm), 4th: 7cm(3cm), 5th: 6cm(5.5cm) L: 2nd: 8cm(8cm), 3rd: 8cm(8cm), 4th: 7.5cm(7cm), 5th: 7cm(7cm)   02/03/2022:   Shoulder flexion: R: 122(138), L: 148(154) Shoulder abduction: R: 109(125), L: 125(125 Elbow: R: 0-148, L: 0-146 Wrist flexion: R: 60(68), L: 79 Writ extension: R: 40(55), L: 3(10) RD: R: 14(24), L: 20(28) UD: R: 8(24), L: 6(18) Thumb radial abduction: R: 20(25), L: 8(20) Digit flexion to the [facility]: R:  2nd: 4.5cm(3cm), 3rd: 6.5cm(4.5cm), 4th: 6.5 cm(3cm), 5th: 4.5cm(5cm) L: 2nd: 8cm(8cm), 3rd: 8cm(7cm), 4th: 7.5cm(7cm), 5th: 6.5cm(6.5cm)   04/07/2022:   Shoulder flexion: R: 125(150), L: 160(160) Shoulder abduction: R: 109(125), L: 125(125 Elbow: R: 0-148, L: 0-146 Wrist flexion: R: 60(68), L: 79 Writ extension: R: 40(55), L: 5(18) RD: R: 14(24), L: 20(28) UD: R: 18(24), L: 8(18) Thumb radial abduction: R: 20(25), L: 8(20) Digit flexion to the [facility]: R:  2nd: 4.5 cm(4 cm), 3rd: 6.5cm(3cm), 4th: 7 cm (5cm), 5th: 5cm(5cm) L: 2nd: 8cm(8cm), 3rd: 8cm(7cm), 4th: 7.5cm(7cm), 5th: 7cm(7cm)   07/05/2022:   Shoulder flexion: R: 125(154), L: 160(160) Shoulder abduction: R: 95(130), L: 143(143) Elbow: R: 0-148, L: 0-146 Wrist flexion: R: 60(68), L: 79 Writ  extension: R: 40(60), L: 0(12) RD: R: 10(24), L: 12(20) UD: R: 16(24), L: 8(16) Thumb radial abduction: R: 20(25), L: 8(20) Digit flexion to the [facility]: R:  2nd: 4 cm(4cm), 3rd: 6.5cm( 3.5cm), 4th: 7 cm (5cm), 5th: 6cm(5cm) L: 2nd: 8cm(8cm), 3rd: 8cm(7cm), 4th: 8cm(7cm), 5th: 7cm(7cm)   09/13/2022:   Shoulder flexion: R: 125(154), L: 160(160) Shoulder abduction: R: 100(130), L: 143(143) Elbow: R: 0-150, L: 0-150 Wrist flexion: R: 55(68), L: 79 Writ extension: R: 45(60), L: 0(12) RD: R: 10(24), L: 12(20) UD: R: 16(24), L: 8(16) Thumb radial abduction: R: 20(25), L: 8(20) Digit flexion to the [facility]: R:  2nd: 4 cm(4cm), 3rd: 6cm( 5cm), 4th: 6 cm (4cm), 5th: 5cm(5cm) L: 2nd: 7cm(cm), 3rd: 7cm(6cm), 4th: 7cm(6cm), 5th: 6cm(6cm)   10/27/2022   Shoulder flexion: R: 128(154), L: 160(160) Shoulder abduction: R: 105(130), L: 143(143) Elbow: R: 0-150, L: 0-150 Wrist flexion: R: 55(68), L: 79 Writ extension: R: 46(64), L: 0(20) RD: R: 10(20), L: 18(24) UD: R: 18(28), L: 8(20) Thumb radial abduction: R: 30(42), L:20(24) Digit flexion to the [facility]: R:  2nd: 4 cm(3cm), 3rd: 6cm( 6cm), 4th: 6 cm (5cm), 5th: 5cm(5cm) L: 2nd: 8cm(8cm), 3rd: 8cm(6cm),  4th: 8cm(7cm), 5th: 6cm(6cm)  01/19/2023  Shoulder flexion: R: 131(154), L: 160(160) Shoulder abduction: R: 110(130), L: 143(143) Elbow: R: 0-150, L: 0-150 Wrist flexion: R: 55(68), L: 79 Writ extension: R: 46(64), L: 0(20) RD: R: 10(20), L: 18(24) UD: R: 18(28), L: 14(20) Thumb radial abduction: R: 38(48), L:24(26) Thumb IP flexion: R: 35(50) L: 20(45) Digit flexion to the [facility]: R:  2nd: 3.5 cm(3cm), 3rd: 6cm( 6cm), 4th: 6 cm (5cm), 5th: 5cm(4.5cm) L: 2nd: 8cm(7.5cm), 3rd: 8cm(6cm), 4th: 7.5cm(6cm), 5th: 6cm(4.5cm)  04/04/2023  Shoulder flexion: R: 134(154), L: 160(160) Shoulder abduction: R: 114(130), L: 143(143) Elbow: R: WNL, L: WNL Wrist flexion: R: 55(68), L: 80 Writ extension: R: 46(64), L: -10(10) RD: R: 14(24), L: 20(24) UD: R:  20(30), L: 14(20) Thumb radial abduction: R: 38(48), L:28(30) Thumb IP flexion: R: 40(50) L: 25(45) Digit flexion to the [facility]: R:  2nd: 3.5 cm(3cm), 3rd: 6cm( 5cm), 4th: 6 cm (4cm), 5th: 5cm(5cm) L: 2nd: 8cm(7.5cm), 3rd: 8cm(7cm), 4th: 7.5cm(7cm), 5th: 7cm(6cm)   COORDINATION:   01/19/2023:  9 hole peg test:  Right: 2 pegs placed in 1 min. & 5 sec.  Pt. was able to remove 9 pegs in 17 sec.  Left: 2 pegs placed in 1 min. & 10 sec. Pt. was able to remove 9 pegs in 20 sec.  04/04/23:  Pt. was able to consistently grasp the pegs however each one slipped out of her fingers when attempting to place them into the Pegboard.  SENSATION: Intact  EDEMA: N/A   COGNITION: Overall cognitive status: WNL   VISION: Subjective report: Wears glasses, No changes in vision.  PERCEPTION: Intact  TODAY'S TREATMENT:                                                                                                                              DATE:  04/11/23   Manual Therapy:   Pt. tolerated soft tissue massage to the volar, and dorsal surface of each digit on the bilateral hands, digit lateral, and sagittal bands 2/2 to stiffness following moist heat modality. Soft tissue mobilization was performed for carpal, and metacarpal spread stretches. Manual therapy was performed independent of, and in preparation for therapeutic Ex.     Therapeutic Ex.    Pt. tolerated PROM followed by AROM bilateral wrist extension, PROM for bilateral digit MP, PIP, and DIP flexion, and extension, thumb abduction. Pt. performed 2# dowel ex. For UE strengthening secondary to weakness.  Attempted tendon glide exercises in preparation for fisting. Bilateral shoulder flexion, chest press, circular patterns, and elbow flexion/extension were performed   PATIENT EDUCATION:  Education details: Bilateral hand function, ROM, and strengthening, reaching, and FMC  Person educated: Patient Education method: Explanation,  Demonstration, and Verbal cues Education comprehension: verbalized understanding and returned demonstration  HOME EXERCISE PROGRAM: Continue to assess HEP needs, and provide as needed.   GOALS: Goals reviewed with patient? Yes  LONG TERM GOALS: Target date: 04/13/2023  3.  Patient will  demonstrate improved composite finger flexion to be able to firmly hold and use adaptive devices during ADLs, and IADLs.  Baseline: 04/04/2023: Pt. continues to work on improving Digit flexion to the [facility] to be able to efficiently handle Adaptive equipment. 01/19/2023: Pt. Is improving with Bilateral digit flexion to the [facility]. Pt. has difficulty firmly holding adaptive devices. 12/06/2022: Pt. continues to present with increased MP, PIP, and DIP digit tightness/stiffness limiting the formulation of bilateral composites fists. 10/27/2022: Pt. presents with increased MP, PIP, and DIP digit tightness/stiffness limiting the formulation of bilateral composites fists during this progress reporting period limiting. Digit flexion to the [facility]: R:  2nd: 4 cm(3cm), 3rd: 6cm( 6cm), 4th: 6 cm (5cm), 5th: 5cm(5cm), L: 2nd: 8cm(8cm), 3rd: 8cm(6cm), 4th: 8cm(7cm), 5th: 6cm(6cm) 09/20/2022: Improving digit flexion to the [facility]. 09/13/22: improved digit flexion to the [facility]. 07/05/2022: Pt. presents with digit MP, PIP, and DIP extensor tightness limiting her ability to securely grip objects in her bilateral hands.  04/07/2022: Pt. Has improved with right 2nd, and 3rd digit flexion towards the [facility]. Pt. Continues to have difficulty securely holding and applying deodorant.02/03/2022: Pt. Has improved with digit flexion, however, continues to have difficulty securely holding and using deodorant. 12/09/2021: Bilateral hand/digit MP, PIP, and DIP extension tightness limits her ability achieve digit flexion to hold, and apply deodorant. 10/28/2021:  Pt. continues to have difficulty holding the deodorant. 01/12/2021: Pt. Presents with limited digit extension. Pt.  Is able to initiate holding deodorant, however is unable to hold it while using it  Goal status: Ongoing   4.  Pt. Will improve bilateral wrist extension in preparation for anticipating, and initiating reaching for objects at the table.  Baseline: 04/04/23: R: 46(64) L: -10(10)01/19/2023:  R: 46(64) L: 0(20) 12/06/2022: Pt continues to to progress with bilateral wrist extension in preparation fro functional reaching. 10/27/2022: Pt. Is improving with bilateral wrist extension. R: 46(64) L: 0(20) 09/20/2022: limited PROM in the Left wrist extension 2/2 tightness/stiffness. 09/13/22: R: 55(68), L: 0(12) 07/19/2022: Bilateral wrist extension in limited. 07/05/2022: Right: 40(60) Left: 0(12) 04/07/2022: Right: 40(55) Left: 5(18) 02/03/2022: Right: 34(55) Left: 3(4) 12/09/2021: Right  34(55)  Left  3(4) 10/28/2021:  Right 22(38), Left 0(15) 09/14/2021:  Right: 17(35), left 2(15)  Goal status: Left wrist tightness noted. Ongoing  7.  Pt. Will increase bilateral lateral pinch strength by 2 lbs to be able to securely grasp items during ADLs, and IADL tasks.  Baseline: 01/19/2023: Deferred as the Pinch meter is out for calibration.12/06/2022: Pt. Is able to more securely hold objects during ADLs/IADLs 10/27/2022: Pt. Is improving with holding items with her bilateral thumbs. (Pinch meter out for calibration) 09/20/2022: lateral pinch continues to be limited 09/13/22: R 6.5# L 4# 07/19/2022: TBD 07/05/2022: TBD 07/2022: NT-Pinch meter out for calibration. 02/03/2022: Right: 6#, Left: 4# 12/09/2021: Right: 6#, Left: 4#  Goal status: Deferred   9.  Pt. will complete plant care with modified independence.  Baseline:12/06/2022: Pt. Is independent with plant care in her husband's room, however is not able to access her plants due to furniture blocking access to them. 10/27/2022: Pt. Is able to water plants that are closer, and within reach. Pt. continues to have difficulty reaching for thorough plant care. 09/20/2022: Pt. Continues to  have difficulty reaching for thorough plant care. 09/13/22: continues to report intermittent difficulty 07/19/2022: Pt. Continues to be able to water, and care for some of her plants. Pt. Has more difficulty with plants that are harder to reach. 07/05/2022: pt. Is able  to water, and care for some of her plants. Pt. Has more difficulty with plants that are harder to reach. 04/07/2022: Pt. is now able to hold a cup and water her plants.02/03/2022: Pt. has difficulty caring for her plants.  Goal status:Achieved   10.  Pt. will demonstrate adaptive techniques to assist with the efficiency of self-dressing, or morning care tasks.  Baseline: 04/04/2023:  Pt. continues to work on improving UE functioning to be able to efficiently use Adaptive equipment. 01/19/2203: Pt. Is able to donn her jacket in reverse independently. Pt. requires assist from staff, as Pt. Reports staff have decreased time. 12/06/2022: Pt. requires assist from staff, as Pt. Reports staff have decreased time. 10/27/2022: Pt. is able to assist with initiating UE dressing. Pt. requires assist from personal, and staff care aides. 09/20/2022: Pt. continues to require assist form personal, and staff care aides. 09/13/22: MODA dressing, reports she is often rushed 07/19/2022: Pt. continues to require assist with self-dressing/morning care tasks. 07/05/2022: Continue with goal. 04/07/2022: Pt. Continues to require assist with the efficiency of self-dressing, and morning care tasks.02/03/2022: Pt. requires assist from caregivers 2/2 time limitations during morning care.  Goal status: Ongoing  11.  Pt. will improve BUE strength to improve ADL, and IADL functioning.  Baseline: 04/04/2023: shoulder flexion L 4/5, R 3+/5; shoulder abduction L 4+/5, R 3+/5. elbow flexion B 5/5, elbow extension 4/5 01/19/2023: L 4/5, R 3+/5 12/06/2022: Continue 10/27/2022: shoulder flexion L 4/5, R 3+/5; shoulder abduction L 4+/5, R 3+/5. elbow flexion B 5/5, elbow extension 4/5 09/20/2022:  Pt. Presents with limited BUE strength 09/13/22: shoulder flexion L 4/5, R 3/5; elbow flexion B 5/5, elbow extension 4-/5; shoulder abduction L 4+/5, R 3+/5. 07/19/2022: BUE strength continues to be limited. 07/05/2022: shoulder flexion: right 4-/5, abduction: 3+/5, elbow flexion: right: 5/5, left 5/5, extension: right: 4-/5, left 4-/5, wrist extension: right: 3-/5, left: 2-/5  Goal status: Ongoing  12.  Pt. will improve bilateral thumb radial abduction in order to be able to hold the grab bars while standing with PT  Baseline: 04/04/23: R: 38(48), X:91(47) 01/19/2023: R: 38(48) L: 24(26)12/06/2022: Pt. Continues to present  with limited thumb abduction, however is improving holding onto the parallel bars.10/27/2022: thumb radial abduction: R: 30(42), L: 20(24  Goal status: Ongoing  13.  Pt. Will be able to securely hold, and stabilize medication bottles at a tabletop surface when opening, and closing them.  Baseline: 01/19/2023: Pt. Is now able to securely hold medication bottles while opening them, and stabilizes them on surfaces while opening them. 12/06/2022: Pt is improving with securely stabilizing medication bottles. 10/27/2022: Pt. Stabilizes bottles against her torso when attempting to open, and close them   Goal status: Achieved  14: Pt. Will improve bilateral thumb IP flexion to improve active grasping patterns.    Baseline: 04/04/2023:  R: 40(50) L: 25(45) 01/19/2023: Right 35(50), Left: 20/45)    Goal status: Ongoing    15. Pt. Will improve bilateral FMC/speed, and dexterity. As evidence by improved scores on the 9 hole peg test.  Baseline: 04/04/23: Pt. was able to consistently grasp the pegs however each one slipped out of her fingers when attempting to place them into the Pegboard. 01/19/2023: 2 pegs placed in 1 min. & 5 sec.  Pt. was able to remove 9 pegs in 17 sec.; Left: 2 pegs placed in 1 min. & 10 sec. Pt. was able to remove 9 pegs in 20 sec.   Goal status:  Ongoing  ASSESSMENT:  CLINICAL IMPRESSION:  Pt. Continues to olerate BUE strengthening with a 2# dowel weight today for UE strengthening exercises without difficulty today.  Pt. Required fewer cues initially for hand placement, and there. ex. technique. Pt. Required step-by step cues for tendon glide exercises in preparation for fisting. Pt. Continues to present with extensor tightness in the digits of the bilateral hands, as well as the thumb adductors limiting the thumb webspace. Pt. continues to benefit form OT services to work on impoving ROM, and UE strength in order to work towards increasing bilateral hand grasp on objects, and increasing engagement of bilateral hands during ADLs, and IADL tasks.     PERFORMANCE DE PERFORMANCE DEFICITS: in functional skills including ADLs, IADLs, coordination, dexterity, ROM, strength, and UE functional use, cognitive skills including , and psychosocial skills including coping strategies and environmental adaptation.   IMPAIRMENTS: are limiting patient from ADLs, IADLs, and leisure.   CO-MORBIDITIES: may have co-morbidities  that affects occupational performance. Patient will benefit from skilled OT to address above impairments and improve overall function.  MODIFICATION OR ASSISTANCE TO COMPLETE EVALUATION: Min-Moderate modification of tasks or assist with assess necessary to complete an evaluation.  OT OCCUPATIONAL PROFILE AND HISTORY: Detailed assessment: Review of records and additional review of physical, cognitive, psychosocial history related to current functional performance.  CLINICAL DECISION MAKING: Moderate - several treatment options, min-mod task modification necessary  REHAB POTENTIAL: Good for stated goals  PLAN:  OT FREQUENCY 2x's a week  OT DURATION: 12 weeks  PLANNED INTERVENTIONS ADL training, A/E training, UE ther. Ex, Manual therapy, neuromuscular re-education, moist heat modality, Paraffin Bath, Splinting, and   Pt./caregiver education   RECOMMENDED OTHER SERVICES: PT  CONSULTED AND AGREED WITH PLAN OF CARE: Patient  PLAN FOR NEXT SESSION: Continue Treatment as per established POC  Olegario Messier, MS, OTR/L  04/11/23, 1:12 PM      Occupational Therapy Neuro Treatment Note          Patient Name: Sherry [name] MRN: [id number] DOB:06/14/1934, 89 y.o., female Today's Date: 11/29/2022  PCP:  Cindi Carbon PROVIDER: Earnestine Mealing, MD  END OF SESSION:   OT End of Session - 04/11/23 1214     Visit Number 111    Number of Visits 192    Date for OT Re-Evaluation 06/27/23    Authorization Time Period Progress report period starting 0812/2024    Progress Note Due on Visit 10    OT Start Time 1100    OT Stop Time 1143    OT Time Calculation (min) 43 min    Equipment Utilized During Treatment powered wheelchair    Activity Tolerance Patient tolerated treatment well    Behavior During Therapy WFL for tasks assessed/performed                     Past Medical History:  Diagnosis Date   Acute blood loss anemia    Arthritis    Cancer (HCC)    skin   Central cord syndrome at C6 level of cervical spinal cord (HCC) 11/29/2017   Hypertension    Protein-calorie malnutrition, severe (HCC) 01/24/2018   S/P insertion of IVC (inferior vena caval) filter 01/24/2018   Tetraparesis (HCC)    Past Surgical History:  Procedure Laterality Date   ANTERIOR CERVICAL DECOMP/DISCECTOMY FUSION N/A 11/29/2017   Procedure: Cervical five-six, six-seven Anterior Cervical Decompression Fusion;  Surgeon: Jadene Pierini, MD;  Location: [facility] OR;  Service: Neurosurgery;  Laterality: N/A;  Cervical five-six, six-seven Anterior  Cervical Decompression Fusion   CATARACT EXTRACTION     FEMUR IM NAIL Left 08/16/2021   Procedure: INTRAMEDULLARY (IM) NAIL FEMORAL;  Surgeon: Deeann Saint, MD;  Location: [facility] ORS;  Service: Orthopedics;  Laterality: Left;   IR IVC FILTER PLMT / S&I /IMG GUID/MOD SED   12/08/2017   Patient Active Problem List   Diagnosis Date Noted   Age-related osteoporosis without current pathological fracture 07/27/2022   Mild recurrent major depression (HCC) 07/27/2022   Hypertensive heart disease with other congestive heart failure (HCC) 02/14/2022   Chronic indwelling Foley catheter 02/14/2022   Closed hip fracture (HCC) 08/15/2021   DVT (deep venous thrombosis) (HCC) 08/15/2021   Quadriplegia (HCC) 08/15/2021   Fall 08/15/2021   Constipation due to slow transit 08/31/2018   Trauma 06/05/2018   Neuropathic pain 06/05/2018   Neurogenic bowel 06/05/2018   Vaginal yeast infection 01/30/2018   Healthcare-associated pneumonia 01/25/2018   Chronic allergic rhinitis 01/24/2018   Depression with anxiety 01/24/2018   UTI due to Klebsiella species 01/24/2018   Protein-calorie malnutrition, severe (HCC) 01/24/2018   S/P insertion of IVC (inferior vena caval) filter 01/24/2018   Tetraparesis (HCC) 01/20/2018   Neurogenic bladder 01/20/2018   Reactive depression    Benign essential HTN    Acute postoperative anemia due to expected blood loss    Central cord syndrome at C6 level of cervical spinal cord (HCC) 11/29/2017   Allergy to alpha-gal 11/25/2016   SCC (squamous cell carcinoma) 04/08/2014   ONSET DATE: 11/29/2017  REFERRING DIAG: Central Cord Syndrome at C6 of the Cervical Spinal Cord, Fall with Bilateral Closed Hip Fractures, with ORIF repair with Intramedullary Nailing of the Left Hip Fracture.   THERAPY DIAG:  Muscle weakness (generalized)  Other lack of coordination  Rationale for Evaluation and Treatment: Rehabilitation  SUBJECTIVE:   SUBJECTIVE STATEMENT:  Pt. report having had a good weekend.  Pt accompanied by: self, Personal Care aide  PERTINENT HISTORY: Central Cord Syndrome at C6 of the Cervical Spinal Cord, Fall with Bilateral Closed Hip Fractures, with ORIF repair with Intramedullary Nailing of the Left Hip Fracture.   PRECAUTIONS:  None  WEIGHT BEARING RESTRICTIONS: No  PAIN:  Are you having pain? No pain, just shoulder stiffness   LIVING ENVIRONMENT: Lives with: [facility]  Stairs: No Has following equipment at home: Wheelchair (power)  PLOF: Independent   PATIENT GOALS:  See below for established goals  OBJECTIVE:  Note: Objective measures were completed at Evaluation unless otherwise noted.  HAND DOMINANCE: Right  ADLs: Caregiver assist with ADLs, and [facility]  MOBILITY STATUS: Uses a power w/c  ACTIVITY TOLERANCE: Activity tolerance: Fair  FUNCTIONAL OUTCOME MEASURES:  Measurements:   12/09/2021:   Shoulder flexion: R: 120(136), L: 138(150) Shoulder abduction: R: 108(120), L: 110(120) Elbow: R: 0-148, L: 0-146 Wrist flexion: R: 55(62), L: 78 Writ extension: R: 34(55), L: 3(4) RD: R: 12(20), L: 16(26) UD: R: 8(24), L: 6(18) Thumb radial abduction: R: 11(20), L: 8(20) Digit flexion to the [facility]: R:  2nd: 5cm(3.5cm), 3rd: 7.5 cm(5cm), 4th: 7cm(3cm), 5th: 6cm(5.5cm) L: 2nd: 8cm(8cm), 3rd: 8cm(8cm), 4th: 7.5cm(7cm), 5th: 7cm(7cm)   02/03/2022:   Shoulder flexion: R: 122(138), L: 148(154) Shoulder abduction: R: 109(125), L: 125(125 Elbow: R: 0-148, L: 0-146 Wrist flexion: R: 60(68), L: 79 Writ extension: R: 40(55), L: 3(10) RD: R: 14(24), L: 20(28) UD: R: 8(24), L: 6(18) Thumb radial abduction: R: 20(25), L: 8(20) Digit flexion to the [facility]: R:  2nd: 4.5cm(3cm), 3rd: 6.5cm(4.5cm),  4th: 6.5 cm(3cm), 5th: 4.5cm(5cm) L: 2nd: 8cm(8cm), 3rd: 8cm(7cm), 4th: 7.5cm(7cm), 5th: 6.5cm(6.5cm)   04/07/2022:   Shoulder flexion: R: 125(150), L: 160(160) Shoulder abduction: R: 109(125), L: 125(125 Elbow: R: 0-148, L: 0-146 Wrist flexion: R: 60(68), L: 79 Writ extension: R: 40(55), L: 5(18) RD: R: 14(24), L: 20(28) UD: R: 18(24), L: 8(18) Thumb radial abduction: R: 20(25), L: 8(20) Digit flexion to the [facility]: R:  2nd: 4.5 cm(4 cm), 3rd: 6.5cm(3cm), 4th: 7 cm (5cm), 5th:  5cm(5cm) L: 2nd: 8cm(8cm), 3rd: 8cm(7cm), 4th: 7.5cm(7cm), 5th: 7cm(7cm)   07/05/2022:   Shoulder flexion: R: 125(154), L: 160(160) Shoulder abduction: R: 95(130), L: 143(143) Elbow: R: 0-148, L: 0-146 Wrist flexion: R: 60(68), L: 79 Writ extension: R: 40(60), L: 0(12) RD: R: 10(24), L: 12(20) UD: R: 16(24), L: 8(16) Thumb radial abduction: R: 20(25), L: 8(20) Digit flexion to the [facility]: R:  2nd: 4 cm(4cm), 3rd: 6.5cm( 3.5cm), 4th: 7 cm (5cm), 5th: 6cm(5cm) L: 2nd: 8cm(8cm), 3rd: 8cm(7cm), 4th: 8cm(7cm), 5th: 7cm(7cm)   09/13/2022:   Shoulder flexion: R: 125(154), L: 160(160) Shoulder abduction: R: 100(130), L: 143(143) Elbow: R: 0-150, L: 0-150 Wrist flexion: R: 55(68), L: 79 Writ extension: R: 45(60), L: 0(12) RD: R: 10(24), L: 12(20) UD: R: 16(24), L: 8(16) Thumb radial abduction: R: 20(25), L: 8(20) Digit flexion to the [facility]: R:  2nd: 4 cm(4cm), 3rd: 6cm( 5cm), 4th: 6 cm (4cm), 5th: 5cm(5cm) L: 2nd: 7cm(cm), 3rd: 7cm(6cm), 4th: 7cm(6cm), 5th: 6cm(6cm)   10/27/2022   Shoulder flexion: R: 128(154), L: 160(160) Shoulder abduction: R: 105(130), L: 143(143) Elbow: R: 0-150, L: 0-150 Wrist flexion: R: 55(68), L: 79 Writ extension: R: 46(64), L: 0(20) RD: R: 10(20), L: 18(24) UD: R: 18(28), L: 8(20) Thumb radial abduction: R: 30(42), L:20(24) Digit flexion to the [facility]: R:  2nd: 4 cm(3cm), 3rd: 6cm( 6cm), 4th: 6 cm (5cm), 5th: 5cm(5cm) L: 2nd: 8cm(8cm), 3rd: 8cm(6cm), 4th: 8cm(7cm), 5th: 6cm(6cm)  01/19/2023  Shoulder flexion: R: 131(154), L: 160(160) Shoulder abduction: R: 110(130), L: 143(143) Elbow: R: 0-150, L: 0-150 Wrist flexion: R: 55(68), L: 79 Writ extension: R: 46(64), L: 0(20) RD: R: 10(20), L: 18(24) UD: R: 18(28), L: 14(20) Thumb radial abduction: R: 38(48), L:24(26) Thumb IP flexion: R: 35(50) L: 20(45) Digit flexion to the [facility]: R:  2nd: 3.5 cm(3cm), 3rd: 6cm( 6cm), 4th: 6 cm (5cm), 5th: 5cm(4.5cm) L: 2nd: 8cm(7.5cm), 3rd: 8cm(6cm), 4th: 7.5cm(6cm), 5th:  6cm(4.5cm)  04/04/2023  Shoulder flexion: R: 134(154), L: 160(160) Shoulder abduction: R: 114(130), L: 143(143) Elbow: R: WNL, L: WNL Wrist flexion: R: 55(68), L: 80 Writ extension: R: 46(64), L: -10(10) RD: R: 14(24), L: 20(24) UD: R: 20(30), L: 14(20) Thumb radial abduction: R: 38(48), L:28(30) Thumb IP flexion: R: 40(50) L: 25(45) Digit flexion to the [facility]: R:  2nd: 3.5 cm(3cm), 3rd: 6cm( 5cm), 4th: 6 cm (4cm), 5th: 5cm(5cm) L: 2nd: 8cm(7.5cm), 3rd: 8cm(7cm), 4th: 7.5cm(7cm), 5th: 7cm(6cm)   COORDINATION:   01/19/2023:  9 hole peg test:  Right: 2 pegs placed in 1 min. & 5 sec.  Pt. was able to remove 9 pegs in 17 sec.  Left: 2 pegs placed in 1 min. & 10 sec. Pt. was able to remove 9 pegs in 20 sec.  04/04/23:  Pt. was able to consistently grasp the pegs however each one slipped out of her fingers when attempting to place them into the Pegboard.  SENSATION: Intact  EDEMA: N/A   COGNITION: Overall cognitive status: WNL   VISION: Subjective report: Wears glasses, No changes  in vision.  PERCEPTION: Intact  TODAY'S TREATMENT:                                                                                                                              DATE:  04/11/23   Manual Therapy:   Pt. tolerated soft tissue massage to the volar, and dorsal surface of each digit on the bilateral hands, digit lateral, and sagittal bands 2/2 to stiffness following moist heat modality. Soft tissue mobilization was performed for carpal, and metacarpal spread stretches. Manual therapy was performed independent of, and in preparation for therapeutic Ex.     Therapeutic Ex.    Pt. tolerated PROM followed by AROM bilateral wrist extension, PROM for bilateral digit MP, PIP, and DIP flexion, and extension, thumb abduction. Pt. performed 2# dowel ex. For UE strengthening secondary to weakness. Bilateral shoulder flexion, chest press, circular patterns, and through a horizontal "V" pattern at chest  level.    PATIENT EDUCATION:  Education details: Bilateral hand function, ROM, and strengthening, reaching, and [facility]  Person educated: Patient Education method: Explanation, Demonstration, and Verbal cues Education comprehension: verbalized understanding and returned demonstration  HOME EXERCISE PROGRAM: Continue to assess HEP needs, and provide as needed.   GOALS: Goals reviewed with patient? Yes  LONG TERM GOALS: Target date: 04/13/2023  3.  Patient will demonstrate improved composite finger flexion to be able to firmly hold and use adaptive devices during ADLs, and IADLs.  Baseline: 04/04/2023: Pt. continues to work on improving Digit flexion to the [facility] to be able to efficiently handle Adaptive equipment. 01/19/2023: Pt. Is improving with Bilateral digit flexion to the [facility]. Pt. has difficulty firmly holding adaptive devices. 12/06/2022: Pt. continues to present with increased MP, PIP, and DIP digit tightness/stiffness limiting the formulation of bilateral composites fists. 10/27/2022: Pt. presents with increased MP, PIP, and DIP digit tightness/stiffness limiting the formulation of bilateral composites fists during this progress reporting period limiting. Digit flexion to the [facility]: R:  2nd: 4 cm(3cm), 3rd: 6cm( 6cm), 4th: 6 cm (5cm), 5th: 5cm(5cm), L: 2nd: 8cm(8cm), 3rd: 8cm(6cm), 4th: 8cm(7cm), 5th: 6cm(6cm) 09/20/2022: Improving digit flexion to the [facility]. 09/13/22: improved digit flexion to the [facility]. 07/05/2022: Pt. presents with digit MP, PIP, and DIP extensor tightness limiting her ability to securely grip objects in her bilateral hands.  04/07/2022: Pt. Has improved with right 2nd, and 3rd digit flexion towards the [facility]. Pt. Continues to have difficulty securely holding and applying deodorant.02/03/2022: Pt. Has improved with digit flexion, however, continues to have difficulty securely holding and using deodorant. 12/09/2021: Bilateral hand/digit MP, PIP, and DIP extension tightness limits her ability  achieve digit flexion to hold, and apply deodorant. 10/28/2021:  Pt. continues to have difficulty holding the deodorant. 01/12/2021: Pt. Presents with limited digit extension. Pt. Is able to initiate holding deodorant, however is unable to hold it while using it  Goal status: Ongoing   4.  Pt. Will improve bilateral wrist extension in preparation  for anticipating, and initiating reaching for objects at the table.  Baseline: 04/04/23: R: 46(64) L: -10(10)01/19/2023:  R: 46(64) L: 0(20) 12/06/2022: Pt continues to to progress with bilateral wrist extension in preparation fro functional reaching. 10/27/2022: Pt. Is improving with bilateral wrist extension. R: 46(64) L: 0(20) 09/20/2022: limited PROM in the Left wrist extension 2/2 tightness/stiffness. 09/13/22: R: 55(68), L: 0(12) 07/19/2022: Bilateral wrist extension in limited. 07/05/2022: Right: 40(60) Left: 0(12) 04/07/2022: Right: 40(55) Left: 5(18) 02/03/2022: Right: 34(55) Left: 3(4) 12/09/2021: Right  34(55)  Left  3(4) 10/28/2021:  Right 22(38), Left 0(15) 09/14/2021:  Right: 17(35), left 2(15)  Goal status: Left wrist tightness noted. Ongoing  7.  Pt. Will increase bilateral lateral pinch strength by 2 lbs to be able to securely grasp items during ADLs, and IADL tasks.  Baseline: 01/19/2023: Deferred as the Pinch meter is out for calibration.12/06/2022: Pt. Is able to more securely hold objects during ADLs/IADLs 10/27/2022: Pt. Is improving with holding items with her bilateral thumbs. (Pinch meter out for calibration) 09/20/2022: lateral pinch continues to be limited 09/13/22: R 6.5# L 4# 07/19/2022: TBD 07/05/2022: TBD 07/2022: NT-Pinch meter out for calibration. 02/03/2022: Right: 6#, Left: 4# 12/09/2021: Right: 6#, Left: 4#  Goal status: Deferred   9.  Pt. will complete plant care with modified independence.  Baseline:12/06/2022: Pt. Is independent with plant care in her husband's room, however is not able to access her plants due to furniture blocking access  to them. 10/27/2022: Pt. Is able to water plants that are closer, and within reach. Pt. continues to have difficulty reaching for thorough plant care. 09/20/2022: Pt. Continues to have difficulty reaching for thorough plant care. 09/13/22: continues to report intermittent difficulty 07/19/2022: Pt. Continues to be able to water, and care for some of her plants. Pt. Has more difficulty with plants that are harder to reach. 07/05/2022: pt. Is able to water, and care for some of her plants. Pt. Has more difficulty with plants that are harder to reach. 04/07/2022: Pt. is now able to hold a cup and water her plants.02/03/2022: Pt. has difficulty caring for her plants.  Goal status:Achieved   10.  Pt. will demonstrate adaptive techniques to assist with the efficiency of self-dressing, or morning care tasks.  Baseline: 04/04/2023:  Pt. continues to work on improving UE functioning to be able to efficiently use Adaptive equipment. 01/19/2203: Pt. Is able to donn her jacket in reverse independently. Pt. requires assist from staff, as Pt. Reports staff have decreased time. 12/06/2022: Pt. requires assist from staff, as Pt. Reports staff have decreased time. 10/27/2022: Pt. is able to assist with initiating UE dressing. Pt. requires assist from personal, and staff care aides. 09/20/2022: Pt. continues to require assist form personal, and staff care aides. 09/13/22: MODA dressing, reports she is often rushed 07/19/2022: Pt. continues to require assist with self-dressing/morning care tasks. 07/05/2022: Continue with goal. 04/07/2022: Pt. Continues to require assist with the efficiency of self-dressing, and morning care tasks.02/03/2022: Pt. requires assist from caregivers 2/2 time limitations during morning care.  Goal status: Ongoing  11.  Pt. will improve BUE strength to improve ADL, and IADL functioning.  Baseline: 04/04/2023: shoulder flexion L 4/5, R 3+/5; shoulder abduction L 4+/5, R 3+/5. elbow flexion B 5/5, elbow extension 4/5  01/19/2023: L 4/5, R 3+/5 12/06/2022: Continue 10/27/2022: shoulder flexion L 4/5, R 3+/5; shoulder abduction L 4+/5, R 3+/5. elbow flexion B 5/5, elbow extension 4/5 09/20/2022: Pt. Presents with limited BUE strength 09/13/22: shoulder flexion L 4/5,  R 3/5; elbow flexion B 5/5, elbow extension 4-/5; shoulder abduction L 4+/5, R 3+/5. 07/19/2022: BUE strength continues to be limited. 07/05/2022: shoulder flexion: right 4-/5, abduction: 3+/5, elbow flexion: right: 5/5, left 5/5, extension: right: 4-/5, left 4-/5, wrist extension: right: 3-/5, left: 2-/5  Goal status: Ongoing  12.  Pt. will improve bilateral thumb radial abduction in order to be able to hold the grab bars while standing with PT  Baseline: 04/04/23: R: 38(48), O:96(29) 01/19/2023: R: 38(48) L: 24(26)12/06/2022: Pt. Continues to present  with limited thumb abduction, however is improving holding onto the parallel bars.10/27/2022: thumb radial abduction: R: 30(42), L: 20(24  Goal status: Ongoing  13.  Pt. Will be able to securely hold, and stabilize medication bottles at a tabletop surface when opening, and closing them.  Baseline: 01/19/2023: Pt. Is now able to securely hold medication bottles while opening them, and stabilizes them on surfaces while opening them. 12/06/2022: Pt is improving with securely stabilizing medication bottles. 10/27/2022: Pt. Stabilizes bottles against her torso when attempting to open, and close them   Goal status: Achieved  14: Pt. Will improve bilateral thumb IP flexion to improve active grasping patterns.    Baseline: 04/04/2023:  R: 40(50) L: 25(45) 01/19/2023: Right 35(50), Left: 20/45)    Goal status: Ongoing    15. Pt. Will improve bilateral FMC/speed, and dexterity. As evidence by improved scores on the 9 hole peg test.  Baseline: 04/04/23: Pt. was able to consistently grasp the pegs however each one slipped out of her fingers when attempting to place them into the Pegboard. 01/19/2023: 2 pegs placed in 1 min. &  5 sec.  Pt. was able to remove 9 pegs in 17 sec.; Left: 2 pegs placed in 1 min. & 10 sec. Pt. was able to remove 9 pegs in 20 sec.   Goal status: Ongoing      ASSESSMENT:  CLINICAL IMPRESSION:  Pt. tolerated moist heat, manual therapy, and ROM well this date. Pt. required cues, and assist for visual demonstration of proper there. Ex. technique. Pt. continues to benefit from OT services to work on impoving ROM, and UE strength in order to work towards increasing bilateral hand grasp on objects, and increasing engagement of bilateral hands during ADLs, and IADL tasks.    PERFORMANCE DEFICITS: in functional skills including ADLs, IADLs, coordination, dexterity, ROM, strength, and UE functional use, cognitive skills including , and psychosocial skills including coping strategies and environmental adaptation.   IMPAIRMENTS: are limiting patient from ADLs, IADLs, and leisure.   CO-MORBIDITIES: may have co-morbidities  that affects occupational performance. Patient will benefit from skilled OT to address above impairments and improve overall function.  MODIFICATION OR ASSISTANCE TO COMPLETE EVALUATION: Min-Moderate modification of tasks or assist with assess necessary to complete an evaluation.  OT OCCUPATIONAL PROFILE AND HISTORY: Detailed assessment: Review of records and additional review of physical, cognitive, psychosocial history related to current functional performance.  CLINICAL DECISION MAKING: Moderate - several treatment options, min-mod task modification necessary  REHAB POTENTIAL: Good for stated goals  PLAN:  OT FREQUENCY 2x's a week  OT DURATION: 12 weeks  PLANNED INTERVENTIONS ADL training, A/E training, UE ther. Ex, Manual therapy, neuromuscular re-education, moist heat modality, Paraffin Bath, Splinting, and  Pt./caregiver education   RECOMMENDED OTHER SERVICES: PT  CONSULTED AND AGREED WITH PLAN OF CARE: Patient  PLAN FOR NEXT SESSION: Continue Treatment as per  established POC  Olegario Messier, MS, OTR/L  04/11/23, 1:12 PM

## 2023-04-11 NOTE — Therapy (Signed)
 OUTPATIENT PHYSICAL THERAPY NEURO TREATMENT    Patient Name: Sherry [name] MRN: [id number] DOB:1934/11/05, 89 y.o., female Today's Date: 04/11/2023   PCP: Earnestine Mealing, MD REFERRING PROVIDER: Deeann Saint   PT End of Session - 04/11/23 1211     Visit Number 102    Number of Visits 124    Date for PT Re-Evaluation 06/20/23    Authorization Type [employer] FOTO performed by PT on eval (7/20), score 12, Progress note on 03/10/2020; PN on 08/20/2020    Authorization Time Period Initial cert 04/24/4008-27/25/3664; Recert 01/11/2022-04/05/2022; Recert 04/07/2022-06/30/2022    Progress Note Due on Visit 100    PT Start Time 1146    PT Stop Time 1227    PT Time Calculation (min) 41 min    Equipment Utilized During Treatment Gait belt    Activity Tolerance Patient tolerated treatment well    Behavior During Therapy WFL for tasks assessed/performed                    Past Medical History:  Diagnosis Date   Acute blood loss anemia    Arthritis    Cancer (HCC)    skin   Central cord syndrome at C6 level of cervical spinal cord (HCC) 11/29/2017   Hypertension    Protein-calorie malnutrition, severe (HCC) 01/24/2018   S/P insertion of IVC (inferior vena caval) filter 01/24/2018   Tetraparesis (HCC)    Past Surgical History:  Procedure Laterality Date   ANTERIOR CERVICAL DECOMP/DISCECTOMY FUSION N/A 11/29/2017   Procedure: Cervical five-six, six-seven Anterior Cervical Decompression Fusion;  Surgeon: Jadene Pierini, MD;  Location: [facility] OR;  Service: Neurosurgery;  Laterality: N/A;  Cervical five-six, six-seven Anterior Cervical Decompression Fusion   CATARACT EXTRACTION     FEMUR IM NAIL Left 08/16/2021   Procedure: INTRAMEDULLARY (IM) NAIL FEMORAL;  Surgeon: Deeann Saint, MD;  Location: [facility] ORS;  Service: Orthopedics;  Laterality: Left;   IR IVC FILTER PLMT / S&I /IMG GUID/MOD SED  12/08/2017   Patient Active Problem List   Diagnosis Date Noted   Age-related  osteoporosis without current pathological fracture 07/27/2022   Mild recurrent major depression (HCC) 07/27/2022   Hypertensive heart disease with other congestive heart failure (HCC) 02/14/2022   Chronic indwelling Foley catheter 02/14/2022   Closed hip fracture (HCC) 08/15/2021   DVT (deep venous thrombosis) (HCC) 08/15/2021   Quadriplegia (HCC) 08/15/2021   Fall 08/15/2021   Constipation due to slow transit 08/31/2018   Trauma 06/05/2018   Neuropathic pain 06/05/2018   Neurogenic bowel 06/05/2018   Vaginal yeast infection 01/30/2018   Healthcare-associated pneumonia 01/25/2018   Chronic allergic rhinitis 01/24/2018   Depression with anxiety 01/24/2018   UTI due to Klebsiella species 01/24/2018   Protein-calorie malnutrition, severe (HCC) 01/24/2018   S/P insertion of IVC (inferior vena caval) filter 01/24/2018   Tetraparesis (HCC) 01/20/2018   Neurogenic bladder 01/20/2018   Reactive depression    Benign essential HTN    Acute postoperative anemia due to expected blood loss    Central cord syndrome at C6 level of cervical spinal cord (HCC) 11/29/2017   Allergy to alpha-gal 11/25/2016   SCC (squamous cell carcinoma) 04/08/2014    ONSET DATE: 08/15/2021 (fall with B hip fx); Initial injury was in 2019- quadraplegia due to central cord syndrome of C6 secondary to neck fracture.   REFERRING DIAG: Bilateral Hip fx; Left Open- s/p Left IM nail femoral on 08/16/2021; Right closed  THERAPY DIAG:  Muscle weakness (generalized)  Other  lack of coordination  Central cord syndrome, sequela (HCC)  Other abnormalities of gait and mobility  Difficulty in walking, not elsewhere classified  Closed fracture of both hips, initial encounter (HCC)  Abnormality of gait and mobility  Unsteadiness on feet  Rationale for Evaluation and Treatment Rehabilitation  SUBJECTIVE:   SUBJECTIVE STATEMENT:   Patient reports feeling okay - states going to [facility] on Wed afternoon for eval  for bracing.       Pt accompanied by:  Paid caregiver-   PERTINENT HISTORY: Sherry [name] is an 87yoF who experienced a fall at home on 08/15/2021 with hip fracture, s/p Left hip ORIF. PMH: quadriplegia due to central cord syndrome of C6 secondary to neck fracture, HTN, depression with anxiety, neurogenic bladder, bilateral DVT on Xarelto (s/p of IVC filter placement). Prior history of significant PT/OT with improvement of function. Pt has full use of shoulder/elbows, limited use of hands, adaptive self feeding sucessful.     PAIN:  None current but does endorse some left thigh pain intermittent  OBJECTIVE:    TODAY'S TREATMENT:   04/11/23    Therex:   Seated resistive hip flex RTB 2 x 12 reps each LE Seated resistive hip ext RTB 2 x 12 reps each LE Reclined hip abd RTB on Left and AAROM on right = 2 sets x 12 reps Reclined heel slides alt LE - 2 sets of 12 reps Seated LAQ (active) 2 sets of 12 reps each LE Active quad sets ( hold 3 sec) - 2set of 12 reps each LE Resistive manual Leg press- 2 sets of 12 reps.   PATIENT EDUCATION: Education details: Exercise technique, Discussion of how to obtain order/F2F for a new brace Patient verbalized understanding.   HOME EXERCISE PROGRAM: No changes at this time  Access Code: [facility] URL: [email or web address] Date: 11/23/2021 Prepared by: Precious Bard Exercises - Seated Gluteal Sets - 2 x daily - 7 x weekly - 2 sets - 10 reps - 5 hold - Seated Quad Set - 2 x daily - 7 x weekly - 2 sets - 10 reps - 5 hold - Seated Long Arc Quad - 2 x daily - 7 x weekly - 2 sets - 10 reps - 5 hold - Seated March - 2 x daily - 7 x weekly - 2 sets - 10 reps - 5 hold - Seated Hip Abduction - 2 x daily - 7 x weekly - 2 sets - 10 reps - 5 hold - Seated Shoulder Shrugs - 2 x daily - 7 x weekly - 2 sets - 10 reps - 5 hold - Wheelchair Pressure Relief - 2 x daily - 7 x weekly - 2 sets - 10 reps - 5 hold  GOALS: Goals reviewed with patient?  Yes  SHORT TERM GOALS: Target date: 02/22/2022  Pt will be independent with initial UE strengthening HEP in order to improve strength and balance in order to decrease fall risk and improve function at home and work. Baseline: 10/19/2021- patient with no formal UE HEP; 12/02/2021= Patient verbalized knowledge of HEP including use of theraband for UE strengthening.  Goal status: GOAL MET  LONG TERM GOALS: Target date: 06/20/2023  Pt will be independent with final for UE/LE HEP in order to improve strength and balance in order to decrease fall risk and improve function at home and work. Baseline: Patient is currently BLE NWB and unable to participate in HEP. Has order for UE strengthening. 01/11/2022- Patient now able to participate in LE  strengthening although NWB still- good understanding for some basic exercises. Will keep goal active to incorporate progressive LE strengthening exercises. 04/07/2022= Patient still participating in progressive LE seated HEP and has no questions at this time.  Goal status: MET  2.  Pt will improve FOTO to target score of 35  to display perceived improvements in ability to complete ADL's.  Baseline: 10/19/2021= 12; 12/02/2021= 17; 01/11/2022=17; 04/07/2022= 17; 07/05/2022=17; 10/06/2022- outcome measure not appropriate as patient in non-ambulatory and not consistently standing. Goal status: Goal not appropriate  3.  Pt will increase strength of B UE  by at least 1/2 MMT grade in order to demonstrate improvement in strength and function  Baseline: patient has range of 2-/5 to 4/5 BUE Strength; 12/02/2021= 4/5 except with wrist ext. 01/11/2022= 4/5 B UE strength expept for wrist Goal status: Goal revised-no longer appropriate- working with OT on all UE strengthening.   4. Pt. Will increase strength of RLE by at least 1/2 MMT grade in order to demonstrate improvement in Standing/transfers.  Baseline: 2-/5 Right hip flex/knee ext/flex; 04/07/2022= 2- with right hip flex/knee ext  (lacking 28 deg from zero) 4/8= Patient able to ext right knee lacking 18 deg from zero. 07/05/2022=Left knee approx 8 deg from zero and right knee = 23 deg from zero; 12/29/2022 - Left LE = full ROM 4-/5 and right knee ext= 23 deg from zero= 2+/5 (gravity minimized); 03/28/2023= Left knee ext=  4/5 and right knee ext= 3-/5 (able to move lower leg through incomplete ROM -lacking 20 deg from full ROM against gravity)  Goal status: Progressing  5. Pt. Will demo ability to stand pivot transfer with max assist for improved functional mobility and less dependent need on mechanical device.   Baseline: Dependent on hoyer lift for all transfers; 04/07/2022- Unable to test secondary to patient with recent UTI and right hand procedure - will attempt to reassess next visit. ; 04/12/22: Unable to successfully stand due to feet plates on loaned power w/c; 05/10/2022- Patient still having to use rental chair and has not received her original power w/c back to practice SPT. 07/04/2021= Patient just received new power w/c and attempted standing from 1st time today- Patient able to stand with max assist today from w/c and did not attempt to perform pivot transfer- difficulty with static standing and placing weight on right LE. 10/06/2022- Not assessed today- patient needs new AFO prior to attempting transfers; 12/29/2022= Patient has not attempted SPT yet - still trying to improve LE strength enough to stand well without knee buckling with weight shifting. 03/28/2023- not attempted today due to ill fitting brace but patient last performed SPT on 01/10/23 and was able to perform with Max A with feet pre-positioned to angle toward direction of transfer  Goal status: ONGOING  6. Pt. Will demonstrate improved functional LE strength as seen by ability to stand > 2 min for improved transfer ability and pregait abilities.   Baseline: not assessed today due to recent dx: UTI; 04/07/2022- Unable to test secondary to patient with recent UTI and right  hand procedure - will attempt to reassess next visit; 04/12/22: Unable to successfully stand due to feet plates on loaned power w/c 05/10/2022- Patient still having to use rental chair and has not received her original power w/c back to practice Standing. 07/05/2022- Patient able to stand with max assist at trunk today for 2 min 3 sec. Will keep goal active to ensure consistency. 10/06/2022= Unable to assess today- patient with some recent  blood pressure issues and needs new AFO. 11/24/2022= Patient demonstrated multiple rounds of standing in // bars- holding up to 1:45 min today but has been able to exhibit 2 min in recent past- working on consistency. 12/29/2022= 2 min 40 sec with patient demonstrating improved overall posture and ability to contract glutes and quads with just min assist at trunk today.   Goal status: MET  7. Pt. Will demonstrate improved functional LE strength as seen by ability to stand > 5 min for improved transfer ability and pregait abilities. Baseline: 12/29/2022= 2 min 40 sec with patient demonstrating improved overall posture and ability to contract glutes and quads with just min assist at trunk today. 03/28/2023= Attempted 2 trials today - at best 2 min 48 sec today but during cert- did stand for 3 min 44 sec.  Goal Status: Ongoing- Patient will likely receive new AFO in upcoming weeks and anticipate more standing time once received.  8. Patient will perform sit to stand transfer with moderate assist consistently > 75% of time for improved transfer ability and less dependence on caregiver.   Baseline: 12/29/2022- Patient currently max assist level assistance with all sit to stand activities. 03/28/2023= Patient is still majority of max assist to stand yet able to pull up from wheelchair smoother in one motion.   Goal status: ONGOING   ASSESSMENT:  CLINICAL IMPRESSION:   Treatment continues to focus on LE strengthening while patient waiting for AFO. She performed well overall with no  report of pain. She is able to push through some manual resistance with leg press and able to raise SLR with assist to keep knee straight. Overall- fatigued at end of session yet able to increased banded resistance with LLE today. Pt will continue to benefit from skilled PT services to optimize independence and reduced caregiver support.   Will focus more on transfers, Sit to stand and standing once patient receives AFO.   OBJECTIVE IMPAIRMENTS Abnormal gait, decreased activity tolerance, decreased balance, decreased coordination, decreased endurance, decreased mobility, difficulty walking, decreased ROM, decreased strength, hypomobility, impaired flexibility, impaired UE functional use, postural dysfunction, and pain.   ACTIVITY LIMITATIONS carrying, lifting, bending, sitting, standing, squatting, sleeping, stairs, transfers, bed mobility, continence, bathing, toileting, dressing, self feeding, reach over head, hygiene/grooming, and caring for others  PARTICIPATION LIMITATIONS: meal prep, cleaning, laundry, medication management, personal finances, interpersonal relationship, driving, shopping, community activity, and yard work  PERSONAL FACTORS Age, Time since onset of injury/illness/exacerbation, and 1-2 comorbidities: HTN, cervical Sx  are also affecting patient's functional outcome.   REHAB POTENTIAL: Good  CLINICAL DECISION MAKING: Evolving/moderate complexity  EVALUATION COMPLEXITY: Moderate  PLAN: PT FREQUENCY: 1-2x/week  PT DURATION: 12 weeks  PLANNED INTERVENTIONS: Therapeutic exercises, Therapeutic activity, Neuromuscular re-education, Balance training, Gait training, Patient/Family education, Self Care, Joint mobilization, DME instructions, Dry Needling, Electrical stimulation, Wheelchair mobility training, Spinal mobilization, Cryotherapy, Moist heat, and Manual therapy  PLAN FOR NEXT SESSION:  LE and core strength training, Sit to stand and static standing; progress to SPT as  able.    2:25 PM, 04/11/23 Louis Meckel, PT Physical Therapist - [place] [facility]  Outpatient Physical Therapy- Main Campus [phone]

## 2023-04-13 ENCOUNTER — Ambulatory Visit: Payer: Medicare HMO

## 2023-04-13 ENCOUNTER — Ambulatory Visit: Payer: Medicare HMO | Admitting: Occupational Therapy

## 2023-04-13 DIAGNOSIS — S14129S Central cord syndrome at unspecified level of cervical spinal cord, sequela: Secondary | ICD-10-CM

## 2023-04-13 DIAGNOSIS — M6281 Muscle weakness (generalized): Secondary | ICD-10-CM

## 2023-04-13 DIAGNOSIS — R278 Other lack of coordination: Secondary | ICD-10-CM

## 2023-04-13 DIAGNOSIS — R269 Unspecified abnormalities of gait and mobility: Secondary | ICD-10-CM

## 2023-04-13 DIAGNOSIS — S72001A Fracture of unspecified part of neck of right femur, initial encounter for closed fracture: Secondary | ICD-10-CM

## 2023-04-13 DIAGNOSIS — R2689 Other abnormalities of gait and mobility: Secondary | ICD-10-CM

## 2023-04-13 DIAGNOSIS — R262 Difficulty in walking, not elsewhere classified: Secondary | ICD-10-CM

## 2023-04-13 DIAGNOSIS — R2681 Unsteadiness on feet: Secondary | ICD-10-CM

## 2023-04-13 NOTE — Therapy (Unsigned)
 OUTPATIENT PHYSICAL THERAPY NEURO TREATMENT    Patient Name: Sherry [name] MRN: [id number] DOB:[birth year], 89 y.o., female Today's Date: 04/14/2023   PCP: Sherry Mealing, MD REFERRING PROVIDER: Deeann [name]   PT End of Session - 04/13/23 1126     Visit Number 103    Number of Visits 124    Date for PT Re-Evaluation 06/20/23    Authorization Type [employer] FOTO performed by PT on eval (7/20), score 12, Progress note on 03/10/2020; PN on 08/20/2020    Authorization Time Period Initial cert 09/30/5619-30/[id number]/5784; Recert 01/11/2022-04/05/2022; Recert 04/07/2022-06/30/2022    Progress Note Due on Visit 100    PT Start Time 1145    PT Stop Time 1226    PT Time Calculation (min) 41 min    Equipment Utilized During Treatment Gait belt    Activity Tolerance Patient tolerated treatment well    Behavior During Therapy WFL for tasks assessed/performed                    Past Medical History:  Diagnosis Date   Acute blood loss anemia    Arthritis    Cancer (HCC)    skin   Central cord syndrome at C6 level of cervical spinal cord (HCC) 11/29/2017   Hypertension    Protein-calorie malnutrition, severe (HCC) 01/24/2018   S/P insertion of IVC (inferior vena caval) filter 01/24/2018   Tetraparesis (HCC)    Past Surgical History:  Procedure Laterality Date   ANTERIOR CERVICAL DECOMP/DISCECTOMY FUSION N/A 11/29/2017   Procedure: Cervical five-six, six-seven Anterior Cervical Decompression Fusion;  Surgeon: Jadene Pierini, MD;  Location: [facility] OR;  Service: Neurosurgery;  Laterality: N/A;  Cervical five-six, six-seven Anterior Cervical Decompression Fusion   CATARACT EXTRACTION     FEMUR IM NAIL Left 08/16/2021   Procedure: INTRAMEDULLARY (IM) NAIL FEMORAL;  Surgeon: Sherry Saint, MD;  Location: [facility] ORS;  Service: Orthopedics;  Laterality: Left;   IR IVC FILTER PLMT / S&I /IMG GUID/MOD SED  12/08/2017   Patient Active Problem List   Diagnosis Date Noted   Age-related  osteoporosis without current pathological fracture 07/27/2022   Mild recurrent major depression (HCC) 07/27/2022   Hypertensive heart disease with other congestive heart failure (HCC) 02/14/2022   Chronic indwelling Foley catheter 02/14/2022   Closed hip fracture (HCC) 08/15/2021   DVT (deep venous thrombosis) (HCC) 08/15/2021   Quadriplegia (HCC) 08/15/2021   Fall 08/15/2021   Constipation due to slow transit 08/31/2018   Trauma 06/05/2018   Neuropathic pain 06/05/2018   Neurogenic bowel 06/05/2018   Vaginal yeast infection 01/30/2018   Healthcare-associated pneumonia 01/25/2018   Chronic allergic rhinitis 01/24/2018   Depression with anxiety 01/24/2018   UTI due to Klebsiella species 01/24/2018   Protein-calorie malnutrition, severe (HCC) 01/24/2018   S/P insertion of IVC (inferior vena caval) filter 01/24/2018   Tetraparesis (HCC) 01/20/2018   Neurogenic bladder 01/20/2018   Reactive depression    Benign essential HTN    Acute postoperative anemia due to expected blood loss    Central cord syndrome at C6 level of cervical spinal cord (HCC) 11/29/2017   Allergy to alpha-gal 11/25/2016   SCC (squamous cell carcinoma) 04/08/2014    ONSET DATE: 08/15/2021 (fall with B hip fx); Initial injury was in 2019- quadraplegia due to central cord syndrome of C6 secondary to neck fracture.   REFERRING DIAG: Bilateral Hip fx; Left Open- s/p Left IM nail femoral on 08/16/2021; Right closed  THERAPY DIAG:  Muscle weakness (generalized)  Other  lack of coordination  Central cord syndrome, sequela (HCC)  Other abnormalities of gait and mobility  Difficulty in walking, not elsewhere classified  Closed fracture of both hips, initial encounter (HCC)  Abnormality of gait and mobility  Unsteadiness on feet  Rationale for Evaluation and Treatment Rehabilitation  SUBJECTIVE:   SUBJECTIVE STATEMENT:   Patient reports feeling okay - excited to go to for assessment of new brace today.        Pt accompanied by:  Paid caregiver-   PERTINENT HISTORY: Sherry [name] is an 87yoF who experienced a fall at home on 08/15/2021 with hip fracture, s/p Left hip ORIF. PMH: quadriplegia due to central cord syndrome of C6 secondary to neck fracture, HTN, depression with anxiety, neurogenic bladder, bilateral DVT on Xarelto (s/p of IVC filter placement). Prior history of significant PT/OT with improvement of function. Pt has full use of shoulder/elbows, limited use of hands, adaptive self feeding sucessful.     PAIN:  None current but does endorse some left thigh pain intermittent  OBJECTIVE:    TODAY'S TREATMENT:   04/13/2023    Therex:   Reclined hip flex into heel slide- AAROM 2 x 12 reps each LE Manual resistive hip add squeeze- hold 5 sec 2 x 10 reps  Reclined hip abd GTB on Left and AAROM on right = 2 sets x 12 reps  Seated LAQ (active on right and GTB on LLE) 2 sets of 12 reps each LE Active quad sets ( hold 3 sec) - 2set of 12 reps each LE Resistive manual Leg press- 2 sets of 12 reps. AAROM Seated Hip flex 2 sets of 15 reps (for full ROM)    PATIENT EDUCATION: Education details: Exercise technique, Discussion of how to obtain order/F2F for a new brace Patient verbalized understanding.   HOME EXERCISE PROGRAM: No changes at this time  Access Code: [facility] URL: [email or web address] Date: 11/23/2021 Prepared by: Precious Bard Exercises - Seated Gluteal Sets - 2 x daily - 7 x weekly - 2 sets - 10 reps - 5 hold - Seated Quad Set - 2 x daily - 7 x weekly - 2 sets - 10 reps - 5 hold - Seated Long Arc Quad - 2 x daily - 7 x weekly - 2 sets - 10 reps - 5 hold - Seated March - 2 x daily - 7 x weekly - 2 sets - 10 reps - 5 hold - Seated Hip Abduction - 2 x daily - 7 x weekly - 2 sets - 10 reps - 5 hold - Seated Shoulder Shrugs - 2 x daily - 7 x weekly - 2 sets - 10 reps - 5 hold - Wheelchair Pressure Relief - 2 x daily - 7 x weekly - 2 sets - 10 reps - 5  hold  GOALS: Goals reviewed with patient? Yes  SHORT TERM GOALS: Target date: 02/22/2022  Pt will be independent with initial UE strengthening HEP in order to improve strength and balance in order to decrease fall risk and improve function at home and work. Baseline: 10/19/2021- patient with no formal UE HEP; 12/02/2021= Patient verbalized knowledge of HEP including use of theraband for UE strengthening.  Goal status: GOAL MET  LONG TERM GOALS: Target date: 06/20/2023  Pt will be independent with final for UE/LE HEP in order to improve strength and balance in order to decrease fall risk and improve function at home and work. Baseline: Patient is currently BLE NWB and unable to participate in HEP. Has order  for UE strengthening. 01/11/2022- Patient now able to participate in LE strengthening although NWB still- good understanding for some basic exercises. Will keep goal active to incorporate progressive LE strengthening exercises. 04/07/2022= Patient still participating in progressive LE seated HEP and has no questions at this time.  Goal status: MET  2.  Pt will improve FOTO to target score of 35  to display perceived improvements in ability to complete ADL's.  Baseline: 10/19/2021= 12; 12/02/2021= 17; 01/11/2022=17; 04/07/2022= 17; 07/05/2022=17; 10/06/2022- outcome measure not appropriate as patient in non-ambulatory and not consistently standing. Goal status: Goal not appropriate  3.  Pt will increase strength of B UE  by at least 1/2 MMT grade in order to demonstrate improvement in strength and function  Baseline: patient has range of 2-/5 to 4/5 BUE Strength; 12/02/2021= 4/5 except with wrist ext. 01/11/2022= 4/5 B UE strength expept for wrist Goal status: Goal revised-no longer appropriate- working with OT on all UE strengthening.   4. Pt. Will increase strength of RLE by at least 1/2 MMT grade in order to demonstrate improvement in Standing/transfers.  Baseline: 2-/5 Right hip flex/knee ext/flex;  04/07/2022= 2- with right hip flex/knee ext (lacking 28 deg from zero) 4/8= Patient able to ext right knee lacking 18 deg from zero. 07/05/2022=Left knee approx 8 deg from zero and right knee = 23 deg from zero; 12/29/2022 - Left LE = full ROM 4-/5 and right knee ext= 23 deg from zero= 2+/5 (gravity minimized); 03/28/2023= Left knee ext=  4/5 and right knee ext= 3-/5 (able to move lower leg through incomplete ROM -lacking 20 deg from full ROM against gravity)  Goal status: Progressing  5. Pt. Will demo ability to stand pivot transfer with max assist for improved functional mobility and less dependent need on mechanical device.   Baseline: Dependent on hoyer lift for all transfers; 04/07/2022- Unable to test secondary to patient with recent UTI and right hand procedure - will attempt to reassess next visit. ; 04/12/22: Unable to successfully stand due to feet plates on loaned power w/c; 05/10/2022- Patient still having to use rental chair and has not received her original power w/c back to practice SPT. 07/04/2021= Patient just received new power w/c and attempted standing from 1st time today- Patient able to stand with max assist today from w/c and did not attempt to perform pivot transfer- difficulty with static standing and placing weight on right LE. 10/06/2022- Not assessed today- patient needs new AFO prior to attempting transfers; 12/29/2022= Patient has not attempted SPT yet - still trying to improve LE strength enough to stand well without knee buckling with weight shifting. 03/28/2023- not attempted today due to ill fitting brace but patient last performed SPT on 01/10/23 and was able to perform with Max A with feet pre-positioned to angle toward direction of transfer  Goal status: ONGOING  6. Pt. Will demonstrate improved functional LE strength as seen by ability to stand > 2 min for improved transfer ability and pregait abilities.   Baseline: not assessed today due to recent dx: UTI; 04/07/2022- Unable to test  secondary to patient with recent UTI and right hand procedure - will attempt to reassess next visit; 04/12/22: Unable to successfully stand due to feet plates on loaned power w/c 05/10/2022- Patient still having to use rental chair and has not received her original power w/c back to practice Standing. 07/05/2022- Patient able to stand with max assist at trunk today for 2 min 3 sec. Will keep goal active to  ensure consistency. 10/06/2022= Unable to assess today- patient with some recent blood pressure issues and needs new AFO. 11/24/2022= Patient demonstrated multiple rounds of standing in // bars- holding up to 1:45 min today but has been able to exhibit 2 min in recent past- working on consistency. 12/29/2022= 2 min 40 sec with patient demonstrating improved overall posture and ability to contract glutes and quads with just min assist at trunk today.   Goal status: MET  7. Pt. Will demonstrate improved functional LE strength as seen by ability to stand > 5 min for improved transfer ability and pregait abilities. Baseline: 12/29/2022= 2 min 40 sec with patient demonstrating improved overall posture and ability to contract glutes and quads with just min assist at trunk today. 03/28/2023= Attempted 2 trials today - at best 2 min 48 sec today but during cert- did stand for 3 min 44 sec.  Goal Status: Ongoing- Patient will likely receive new AFO in upcoming weeks and anticipate more standing time once received.  8. Patient will perform sit to stand transfer with moderate assist consistently > 75% of time for improved transfer ability and less dependence on caregiver.   Baseline: 12/29/2022- Patient currently max assist level assistance with all sit to stand activities. 03/28/2023= Patient is still majority of max assist to stand yet able to pull up from wheelchair smoother in one motion.   Goal status: ONGOING   ASSESSMENT:  CLINICAL IMPRESSION:   Treatment continues to focus on LE strengthening while patient  waiting for AFO. Patient works hard yet right LE continues to fatigue easily. She was able to improve resistance with left quad activity today. Pt will continue to benefit from skilled PT services to optimize independence and reduced caregiver support.   Will focus more on transfers, Sit to stand and standing once patient receives AFO.   OBJECTIVE IMPAIRMENTS Abnormal gait, decreased activity tolerance, decreased balance, decreased coordination, decreased endurance, decreased mobility, difficulty walking, decreased ROM, decreased strength, hypomobility, impaired flexibility, impaired UE functional use, postural dysfunction, and pain.   ACTIVITY LIMITATIONS carrying, lifting, bending, sitting, standing, squatting, sleeping, stairs, transfers, bed mobility, continence, bathing, toileting, dressing, self feeding, reach over head, hygiene/grooming, and caring for others  PARTICIPATION LIMITATIONS: meal prep, cleaning, laundry, medication management, personal finances, interpersonal relationship, driving, shopping, community activity, and yard work  PERSONAL FACTORS Age, Time since onset of injury/illness/exacerbation, and 1-2 comorbidities: HTN, cervical Sx  are also affecting patient's functional outcome.   REHAB POTENTIAL: Good  CLINICAL DECISION MAKING: Evolving/moderate complexity  EVALUATION COMPLEXITY: Moderate  PLAN: PT FREQUENCY: 1-2x/week  PT DURATION: 12 weeks  PLANNED INTERVENTIONS: Therapeutic exercises, Therapeutic activity, Neuromuscular re-education, Balance training, Gait training, Patient/Family education, Self Care, Joint mobilization, DME instructions, Dry Needling, Electrical stimulation, Wheelchair mobility training, Spinal mobilization, Cryotherapy, Moist heat, and Manual therapy  PLAN FOR NEXT SESSION:  LE and core strength training, Sit to stand and static standing; progress to SPT as able.    8:14 AM, 04/14/23 Louis Meckel, PT Physical Therapist - [facility]  Outpatient Physical Therapy- Main [facility] [phone]

## 2023-04-14 NOTE — Therapy (Signed)
 Occupational Therapy Neuro Treatment Note          Patient Name: Sherry [name] MRN: [id number] DOB:[birth year], 89 y.o., female Today's Date: 11/29/2022  PCP:  Cindi Carbon PROVIDER: Earnestine Mealing, MD  END OF SESSION:   OT End of Session - 04/14/23 1027     Visit Number [id number]    Number of Visits 192    Date for OT Re-Evaluation 06/27/23    Authorization Time Period Progress report period starting 04/04/22    OT Start Time 1100    OT Stop Time 1145    OT Time Calculation (min) 45 min    Equipment Utilized During Treatment powered wheelchair    Activity Tolerance Patient tolerated treatment well    Behavior During Therapy WFL for tasks assessed/performed                     Past Medical History:  Diagnosis Date   Acute blood loss anemia    Arthritis    Cancer (HCC)    skin   Central cord syndrome at C6 level of cervical spinal cord (HCC) 11/29/2017   Hypertension    Protein-calorie malnutrition, severe (HCC) 01/24/2018   S/P insertion of IVC (inferior vena caval) filter 01/24/2018   Tetraparesis (HCC)    Past Surgical History:  Procedure Laterality Date   ANTERIOR CERVICAL DECOMP/DISCECTOMY FUSION N/A 11/29/2017   Procedure: Cervical five-six, six-seven Anterior Cervical Decompression Fusion;  Surgeon: Jadene Pierini, MD;  Location: [facility] OR;  Service: Neurosurgery;  Laterality: N/A;  Cervical five-six, six-seven Anterior Cervical Decompression Fusion   CATARACT EXTRACTION     FEMUR IM NAIL Left 08/16/2021   Procedure: INTRAMEDULLARY (IM) NAIL FEMORAL;  Surgeon: Deeann Saint, MD;  Location: [facility] ORS;  Service: Orthopedics;  Laterality: Left;   IR IVC FILTER PLMT / S&I /IMG GUID/MOD SED  12/08/2017   Patient Active Problem List   Diagnosis Date Noted   Age-related osteoporosis without current pathological fracture 07/27/2022   Mild recurrent major depression (HCC) 07/27/2022   Hypertensive heart disease with other congestive heart failure (HCC)  02/14/2022   Chronic indwelling Foley catheter 02/14/2022   Closed hip fracture (HCC) 08/15/2021   DVT (deep venous thrombosis) (HCC) 08/15/2021   Quadriplegia (HCC) 08/15/2021   Fall 08/15/2021   Constipation due to slow transit 08/31/2018   Trauma 06/05/2018   Neuropathic pain 06/05/2018   Neurogenic bowel 06/05/2018   Vaginal yeast infection 01/30/2018   Healthcare-associated pneumonia 01/25/2018   Chronic allergic rhinitis 01/24/2018   Depression with anxiety 01/24/2018   UTI due to Klebsiella species 01/24/2018   Protein-calorie malnutrition, severe (HCC) 01/24/2018   S/P insertion of IVC (inferior vena caval) filter 01/24/2018   Tetraparesis (HCC) 01/20/2018   Neurogenic bladder 01/20/2018   Reactive depression    Benign essential HTN    Acute postoperative anemia due to expected blood loss    Central cord syndrome at C6 level of cervical spinal cord (HCC) 11/29/2017   Allergy to alpha-gal 11/25/2016   SCC (squamous cell carcinoma) 04/08/2014   ONSET DATE: 11/29/2017  REFERRING DIAG: Central Cord Syndrome at C6 of the Cervical Spinal Cord, Fall with Bilateral Closed Hip Fractures, with ORIF repair with Intramedullary Nailing of the Left Hip Fracture.   THERAPY DIAG:  Muscle weakness (generalized)  Other lack of coordination  Rationale for Evaluation and Treatment: Rehabilitation  SUBJECTIVE:    Pt. Reports doing well today.  SUBJECTIVE STATEMENT:  Pt. reports doing well today  Pt accompanied by: self, Personal  Care aide  PERTINENT HISTORY: Central Cord Syndrome at C6 of the Cervical Spinal Cord, Fall with Bilateral Closed Hip Fractures, with ORIF repair with Intramedullary Nailing of the Left Hip Fracture.   PRECAUTIONS: None  WEIGHT BEARING RESTRICTIONS: No  PAIN:  Are you having pain? No pain, just shoulder stiffness   LIVING ENVIRONMENT: Lives with: [facility]  Stairs: No Has following equipment at home: Wheelchair  (power)  PLOF: Independent   PATIENT GOALS:  See below for established goals  OBJECTIVE:  Note: Objective measures were completed at Evaluation unless otherwise noted.  HAND DOMINANCE: Right  ADLs: Caregiver assist with ADLs, and [facility]  MOBILITY STATUS: Uses a power w/c  ACTIVITY TOLERANCE: Activity tolerance: Fair  FUNCTIONAL OUTCOME MEASURES:  Measurements:   12/09/2021:   Shoulder flexion: R: 120(136), L: 138(150) Shoulder abduction: R: 108(120), L: 110(120) Elbow: R: 0-148, L: 0-146 Wrist flexion: R: 55(62), L: 78 Writ extension: R: 34(55), L: 3(4) RD: R: 12(20), L: 16(26) UD: R: 8(24), L: 6(18) Thumb radial abduction: R: 11(20), L: 8(20) Digit flexion to the [facility]: R:  2nd: 5cm(3.5cm), 3rd: 7.5 cm(5cm), 4th: 7cm(3cm), 5th: 6cm(5.5cm) L: 2nd: 8cm(8cm), 3rd: 8cm(8cm), 4th: 7.5cm(7cm), 5th: 7cm(7cm)   02/03/2022:   Shoulder flexion: R: 122(138), L: 148(154) Shoulder abduction: R: 109(125), L: 125(125 Elbow: R: 0-148, L: 0-146 Wrist flexion: R: 60(68), L: 79 Writ extension: R: 40(55), L: 3(10) RD: R: 14(24), L: 20(28) UD: R: 8(24), L: 6(18) Thumb radial abduction: R: 20(25), L: 8(20) Digit flexion to the [facility]: R:  2nd: 4.5cm(3cm), 3rd: 6.5cm(4.5cm), 4th: 6.5 cm(3cm), 5th: 4.5cm(5cm) L: 2nd: 8cm(8cm), 3rd: 8cm(7cm), 4th: 7.5cm(7cm), 5th: 6.5cm(6.5cm)   04/07/2022:   Shoulder flexion: R: 125(150), L: 160(160) Shoulder abduction: R: 109(125), L: 125(125 Elbow: R: 0-148, L: 0-146 Wrist flexion: R: 60(68), L: 79 Writ extension: R: 40(55), L: 5(18) RD: R: 14(24), L: 20(28) UD: R: 18(24), L: 8(18) Thumb radial abduction: R: 20(25), L: 8(20) Digit flexion to the [facility]: R:  2nd: 4.5 cm(4 cm), 3rd: 6.5cm(3cm), 4th: 7 cm (5cm), 5th: 5cm(5cm) L: 2nd: 8cm(8cm), 3rd: 8cm(7cm), 4th: 7.5cm(7cm), 5th: 7cm(7cm)   07/05/2022:   Shoulder flexion: R: 125(154), L: 160(160) Shoulder abduction: R: 95(130), L: 143(143) Elbow: R: 0-148, L: 0-146 Wrist flexion: R: 60(68),  L: 79 Writ extension: R: 40(60), L: 0(12) RD: R: 10(24), L: 12(20) UD: R: 16(24), L: 8(16) Thumb radial abduction: R: 20(25), L: 8(20) Digit flexion to the [facility]: R:  2nd: 4 cm(4cm), 3rd: 6.5cm( 3.5cm), 4th: 7 cm (5cm), 5th: 6cm(5cm) L: 2nd: 8cm(8cm), 3rd: 8cm(7cm), 4th: 8cm(7cm), 5th: 7cm(7cm)   09/13/2022:   Shoulder flexion: R: 125(154), L: 160(160) Shoulder abduction: R: 100(130), L: 143(143) Elbow: R: 0-150, L: 0-150 Wrist flexion: R: 55(68), L: 79 Writ extension: R: 45(60), L: 0(12) RD: R: 10(24), L: 12(20) UD: R: 16(24), L: 8(16) Thumb radial abduction: R: 20(25), L: 8(20) Digit flexion to the [facility]: R:  2nd: 4 cm(4cm), 3rd: 6cm( 5cm), 4th: 6 cm (4cm), 5th: 5cm(5cm) L: 2nd: 7cm(cm), 3rd: 7cm(6cm), 4th: 7cm(6cm), 5th: 6cm(6cm)   10/27/2022   Shoulder flexion: R: 128(154), L: 160(160) Shoulder abduction: R: 105(130), L: 143(143) Elbow: R: 0-150, L: 0-150 Wrist flexion: R: 55(68), L: 79 Writ extension: R: 46(64), L: 0(20) RD: R: 10(20), L: 18(24) UD: R: 18(28), L: 8(20) Thumb radial abduction: R: 30(42), L:20(24) Digit flexion to the [facility]: R:  2nd: 4 cm(3cm), 3rd: 6cm( 6cm), 4th: 6 cm (5cm), 5th: 5cm(5cm) L: 2nd: 8cm(8cm), 3rd: 8cm(6cm), 4th: 8cm(7cm),  5th: 6cm(6cm)  01/19/2023  Shoulder flexion: R: 131(154), L: 160(160) Shoulder abduction: R: 110(130), L: 143(143) Elbow: R: 0-150, L: 0-150 Wrist flexion: R: 55(68), L: 79 Writ extension: R: 46(64), L: 0(20) RD: R: 10(20), L: 18(24) UD: R: 18(28), L: 14(20) Thumb radial abduction: R: 38(48), L:24(26) Thumb IP flexion: R: 35(50) L: 20(45) Digit flexion to the [facility]: R:  2nd: 3.5 cm(3cm), 3rd: 6cm( 6cm), 4th: 6 cm (5cm), 5th: 5cm(4.5cm) L: 2nd: 8cm(7.5cm), 3rd: 8cm(6cm), 4th: 7.5cm(6cm), 5th: 6cm(4.5cm)  04/04/2023  Shoulder flexion: R: 134(154), L: 160(160) Shoulder abduction: R: 114(130), L: 143(143) Elbow: R: WNL, L: WNL Wrist flexion: R: 55(68), L: 80 Writ extension: R: 46(64), L: -10(10) RD: R: 14(24), L:  20(24) UD: R: 20(30), L: 14(20) Thumb radial abduction: R: 38(48), L:28(30) Thumb IP flexion: R: 40(50) L: 25(45) Digit flexion to the [facility]: R:  2nd: 3.5 cm(3cm), 3rd: 6cm( 5cm), 4th: 6 cm (4cm), 5th: 5cm(5cm) L: 2nd: 8cm(7.5cm), 3rd: 8cm(7cm), 4th: 7.5cm(7cm), 5th: 7cm(6cm)   COORDINATION:   01/19/2023:  9 hole peg test:  Right: 2 pegs placed in 1 min. & 5 sec.  Pt. was able to remove 9 pegs in 17 sec.  Left: 2 pegs placed in 1 min. & 10 sec. Pt. was able to remove 9 pegs in 20 sec.  04/04/23:  Pt. was able to consistently grasp the pegs however each one slipped out of her fingers when attempting to place them into the Pegboard.  SENSATION: Intact  EDEMA: N/A   COGNITION: Overall cognitive status: WNL   VISION: Subjective report: Wears glasses, No changes in vision.  PERCEPTION: Intact  TODAY'S TREATMENT:                                                                                                                              DATE:  04/13/23   Manual Therapy:   Pt. tolerated soft tissue massage to the volar, and dorsal surface of each digit on the bilateral hands, digit lateral, and sagittal bands 2/2 to stiffness following moist heat modality. Soft tissue mobilization was performed for carpal, and metacarpal spread stretches. Manual therapy was performed independent of, and in preparation for therapeutic Ex.     Therapeutic Ex.    Pt. tolerated PROM followed by AROM bilateral wrist extension, PROM for bilateral digit MP, PIP, and DIP flexion, and extension, thumb abduction. Pt. performed 2# dowel ex. For UE strengthening secondary to weakness.  Attempted tendon glide exercises in preparation for fisting. Bilateral shoulder flexion, chest press, circular patterns, and elbow flexion/extension were performed  Neuromuscular re-education:   -Facilitated right hand [facility] skills using a lateral grasp to remove 1" resistive cubes from a velcro resistive board.  -Encouraged  isolated 2nd digit extension to press them into place. -The vertical board was placed at a vertical angle to encourage wrist extension.    PATIENT EDUCATION:  Education details: Bilateral hand function, ROM, and strengthening, reaching, and [facility]  Person educated: Patient  Education method: Explanation, Demonstration, and Verbal cues Education comprehension: verbalized understanding and returned demonstration  HOME EXERCISE PROGRAM: Continue to assess HEP needs, and provide as needed.   GOALS: Goals reviewed with patient? Yes  LONG TERM GOALS: Target date: 04/13/2023  3.  Patient will demonstrate improved composite finger flexion to be able to firmly hold and use adaptive devices during ADLs, and IADLs.  Baseline: 04/04/2023: Pt. continues to work on improving Digit flexion to the [facility] to be able to efficiently handle Adaptive equipment. 01/19/2023: Pt. Is improving with Bilateral digit flexion to the [facility]. Pt. has difficulty firmly holding adaptive devices. 12/06/2022: Pt. continues to present with increased MP, PIP, and DIP digit tightness/stiffness limiting the formulation of bilateral composites fists. 10/27/2022: Pt. presents with increased MP, PIP, and DIP digit tightness/stiffness limiting the formulation of bilateral composites fists during this progress reporting period limiting. Digit flexion to the [facility]: R:  2nd: 4 cm(3cm), 3rd: 6cm( 6cm), 4th: 6 cm (5cm), 5th: 5cm(5cm), L: 2nd: 8cm(8cm), 3rd: 8cm(6cm), 4th: 8cm(7cm), 5th: 6cm(6cm) 09/20/2022: Improving digit flexion to the [facility]. 09/13/22: improved digit flexion to the [facility]. 07/05/2022: Pt. presents with digit MP, PIP, and DIP extensor tightness limiting her ability to securely grip objects in her bilateral hands.  04/07/2022: Pt. Has improved with right 2nd, and 3rd digit flexion towards the [facility]. Pt. Continues to have difficulty securely holding and applying deodorant.02/03/2022: Pt. Has improved with digit flexion, however, continues to have  difficulty securely holding and using deodorant. 12/09/2021: Bilateral hand/digit MP, PIP, and DIP extension tightness limits her ability achieve digit flexion to hold, and apply deodorant. 10/28/2021:  Pt. continues to have difficulty holding the deodorant. 01/12/2021: Pt. Presents with limited digit extension. Pt. Is able to initiate holding deodorant, however is unable to hold it while using it  Goal status: Ongoing   4.  Pt. Will improve bilateral wrist extension in preparation for anticipating, and initiating reaching for objects at the table.  Baseline: 04/04/23: R: 46(64) L: -10(10)01/19/2023:  R: 46(64) L: 0(20) 12/06/2022: Pt continues to to progress with bilateral wrist extension in preparation fro functional reaching. 10/27/2022: Pt. Is improving with bilateral wrist extension. R: 46(64) L: 0(20) 09/20/2022: limited PROM in the Left wrist extension 2/2 tightness/stiffness. 09/13/22: R: 55(68), L: 0(12) 07/19/2022: Bilateral wrist extension in limited. 07/05/2022: Right: 40(60) Left: 0(12) 04/07/2022: Right: 40(55) Left: 5(18) 02/03/2022: Right: 34(55) Left: 3(4) 12/09/2021: Right  34(55)  Left  3(4) 10/28/2021:  Right 22(38), Left 0(15) 09/14/2021:  Right: 17(35), left 2(15)  Goal status: Left wrist tightness noted. Ongoing  7.  Pt. Will increase bilateral lateral pinch strength by 2 lbs to be able to securely grasp items during ADLs, and IADL tasks.  Baseline: 01/19/2023: Deferred as the Pinch meter is out for calibration.12/06/2022: Pt. Is able to more securely hold objects during ADLs/IADLs 10/27/2022: Pt. Is improving with holding items with her bilateral thumbs. (Pinch meter out for calibration) 09/20/2022: lateral pinch continues to be limited 09/13/22: R 6.5# L 4# 07/19/2022: TBD 07/05/2022: TBD 07/2022: NT-Pinch meter out for calibration. 02/03/2022: Right: 6#, Left: 4# 12/09/2021: Right: 6#, Left: 4#  Goal status: Deferred   9.  Pt. will complete plant care with modified independence.   Baseline:12/06/2022: Pt. Is independent with plant care in her husband's room, however is not able to access her plants due to furniture blocking access to them. 10/27/2022: Pt. Is able to water plants that are closer, and within reach. Pt. continues to have difficulty reaching for thorough plant care.  09/20/2022: Pt. Continues to have difficulty reaching for thorough plant care. 09/13/22: continues to report intermittent difficulty 07/19/2022: Pt. Continues to be able to water, and care for some of her plants. Pt. Has more difficulty with plants that are harder to reach. 07/05/2022: pt. Is able to water, and care for some of her plants. Pt. Has more difficulty with plants that are harder to reach. 04/07/2022: Pt. is now able to hold a cup and water her plants.02/03/2022: Pt. has difficulty caring for her plants.  Goal status:Achieved   10.  Pt. will demonstrate adaptive techniques to assist with the efficiency of self-dressing, or morning care tasks.  Baseline: 04/04/2023:  Pt. continues to work on improving UE functioning to be able to efficiently use Adaptive equipment. 01/19/2203: Pt. Is able to donn her jacket in reverse independently. Pt. requires assist from staff, as Pt. Reports staff have decreased time. 12/06/2022: Pt. requires assist from staff, as Pt. Reports staff have decreased time. 10/27/2022: Pt. is able to assist with initiating UE dressing. Pt. requires assist from personal, and staff care aides. 09/20/2022: Pt. continues to require assist form personal, and staff care aides. 09/13/22: MODA dressing, reports she is often rushed 07/19/2022: Pt. continues to require assist with self-dressing/morning care tasks. 07/05/2022: Continue with goal. 04/07/2022: Pt. Continues to require assist with the efficiency of self-dressing, and morning care tasks.02/03/2022: Pt. requires assist from caregivers 2/2 time limitations during morning care.  Goal status: Ongoing  11.  Pt. will improve BUE strength to improve  ADL, and IADL functioning.  Baseline: 04/04/2023: shoulder flexion L 4/5, R 3+/5; shoulder abduction L 4+/5, R 3+/5. elbow flexion B 5/5, elbow extension 4/5 01/19/2023: L 4/5, R 3+/5 12/06/2022: Continue 10/27/2022: shoulder flexion L 4/5, R 3+/5; shoulder abduction L 4+/5, R 3+/5. elbow flexion B 5/5, elbow extension 4/5 09/20/2022: Pt. Presents with limited BUE strength 09/13/22: shoulder flexion L 4/5, R 3/5; elbow flexion B 5/5, elbow extension 4-/5; shoulder abduction L 4+/5, R 3+/5. 07/19/2022: BUE strength continues to be limited. 07/05/2022: shoulder flexion: right 4-/5, abduction: 3+/5, elbow flexion: right: 5/5, left 5/5, extension: right: 4-/5, left 4-/5, wrist extension: right: 3-/5, left: 2-/5  Goal status: Ongoing  12.  Pt. will improve bilateral thumb radial abduction in order to be able to hold the grab bars while standing with PT  Baseline: 04/04/23: R: 38(48), Z:61(09) 01/19/2023: R: 38(48) L: 24(26)12/06/2022: Pt. Continues to present  with limited thumb abduction, however is improving holding onto the parallel bars.10/27/2022: thumb radial abduction: R: 30(42), L: 20(24  Goal status: Ongoing  13.  Pt. Will be able to securely hold, and stabilize medication bottles at a tabletop surface when opening, and closing them.  Baseline: 01/19/2023: Pt. Is now able to securely hold medication bottles while opening them, and stabilizes them on surfaces while opening them. 12/06/2022: Pt is improving with securely stabilizing medication bottles. 10/27/2022: Pt. Stabilizes bottles against her torso when attempting to open, and close them   Goal status: Achieved  14: Pt. Will improve bilateral thumb IP flexion to improve active grasping patterns.    Baseline: 04/04/2023:  R: 40(50) L: 25(45) 01/19/2023: Right 35(50), Left: 20/45)    Goal status: Ongoing    15. Pt. Will improve bilateral FMC/speed, and dexterity. As evidence by improved scores on the 9 hole peg test.  Baseline: 04/04/23: Pt. was able to  consistently grasp the pegs however each one slipped out of her fingers when attempting to place them into the Pegboard. 01/19/2023: 2 pegs  placed in 1 min. & 5 sec.  Pt. was able to remove 9 pegs in 17 sec.; Left: 2 pegs placed in 1 min. & 10 sec. Pt. was able to remove 9 pegs in 20 sec.   Goal status: Ongoing   ASSESSMENT:  CLINICAL IMPRESSION:  Pt. continues to tolerate BUE strengthening with a 2# dowel weight today for UE strengthening exercises without difficulty today. Pt. required fewer cues initially for hand placement, and there. ex. technique.  Pt. was able to grasp the resistive cubes using a lateral grasp. Pt. fatigued during the task, and required the resistive board to be set flat at the tabletop surface. Pt. continues to present with extensor tightness in the digits of the bilateral hands, as well as the thumb adductors limiting the thumb webspace. Pt. continues to benefit form OT services to work on impoving ROM, and UE strength in order to work towards increasing bilateral hand grasp on objects, and increasing engagement of bilateral hands during ADLs, and IADL tasks.     PERFORMANCE DE PERFORMANCE DEFICITS: in functional skills including ADLs, IADLs, coordination, dexterity, ROM, strength, and UE functional use, cognitive skills including , and psychosocial skills including coping strategies and environmental adaptation.   IMPAIRMENTS: are limiting patient from ADLs, IADLs, and leisure.   CO-MORBIDITIES: may have co-morbidities  that affects occupational performance. Patient will benefit from skilled OT to address above impairments and improve overall function.  MODIFICATION OR ASSISTANCE TO COMPLETE EVALUATION: Min-Moderate modification of tasks or assist with assess necessary to complete an evaluation.  OT OCCUPATIONAL PROFILE AND HISTORY: Detailed assessment: Review of records and additional review of physical, cognitive, psychosocial history related to current functional  performance.  CLINICAL DECISION MAKING: Moderate - several treatment options, min-mod task modification necessary  REHAB POTENTIAL: Good for stated goals  PLAN:  OT FREQUENCY 2x's a week  OT DURATION: 12 weeks  PLANNED INTERVENTIONS ADL training, A/E training, UE ther. Ex, Manual therapy, neuromuscular re-education, moist heat modality, Paraffin Bath, Splinting, and  Pt./caregiver education   RECOMMENDED OTHER SERVICES: PT  CONSULTED AND AGREED WITH PLAN OF CARE: Patient  PLAN FOR NEXT SESSION: Continue Treatment as per established POC  Olegario Messier, MS, OTR/L  04/14/23, 10:35 AM

## 2023-04-18 ENCOUNTER — Ambulatory Visit: Payer: Medicare HMO | Admitting: Occupational Therapy

## 2023-04-18 ENCOUNTER — Ambulatory Visit: Payer: Medicare HMO

## 2023-04-18 DIAGNOSIS — R262 Difficulty in walking, not elsewhere classified: Secondary | ICD-10-CM

## 2023-04-18 DIAGNOSIS — R2681 Unsteadiness on feet: Secondary | ICD-10-CM

## 2023-04-18 DIAGNOSIS — S14129S Central cord syndrome at unspecified level of cervical spinal cord, sequela: Secondary | ICD-10-CM

## 2023-04-18 DIAGNOSIS — M6281 Muscle weakness (generalized): Secondary | ICD-10-CM

## 2023-04-18 DIAGNOSIS — R269 Unspecified abnormalities of gait and mobility: Secondary | ICD-10-CM

## 2023-04-18 DIAGNOSIS — S72001A Fracture of unspecified part of neck of right femur, initial encounter for closed fracture: Secondary | ICD-10-CM

## 2023-04-18 DIAGNOSIS — R278 Other lack of coordination: Secondary | ICD-10-CM

## 2023-04-18 DIAGNOSIS — R2689 Other abnormalities of gait and mobility: Secondary | ICD-10-CM

## 2023-04-18 NOTE — Therapy (Addendum)
 Occupational Therapy Neuro Treatment Note          Patient Name: Sherry [name] MRN: [id number] DOB:Jul 11, 1934, 89 y.o., female Today's Date: 11/29/2022  PCP:  Cindi Carbon PROVIDER: Earnestine Mealing, MD  END OF SESSION:   OT End of Session - 04/18/23 1150     Visit Number 113    Number of Visits 192    Date for OT Re-Evaluation 06/27/23    Authorization Time Period Progress report period starting 04/04/23    OT Start Time 1102    OT Stop Time 1145    OT Time Calculation (min) 43 min    Equipment Utilized During Treatment powered wheelchair    Activity Tolerance Patient tolerated treatment well    Behavior During Therapy WFL for tasks assessed/performed                     Past Medical History:  Diagnosis Date   Acute blood loss anemia    Arthritis    Cancer (HCC)    skin   Central cord syndrome at C6 level of cervical spinal cord (HCC) 11/29/2017   Hypertension    Protein-calorie malnutrition, severe (HCC) 01/24/2018   S/P insertion of IVC (inferior vena caval) filter 01/24/2018   Tetraparesis (HCC)    Past Surgical History:  Procedure Laterality Date   ANTERIOR CERVICAL DECOMP/DISCECTOMY FUSION N/A 11/29/2017   Procedure: Cervical five-six, six-seven Anterior Cervical Decompression Fusion;  Surgeon: Jadene Pierini, MD;  Location: [facility] OR;  Service: Neurosurgery;  Laterality: N/A;  Cervical five-six, six-seven Anterior Cervical Decompression Fusion   CATARACT EXTRACTION     FEMUR IM NAIL Left 08/16/2021   Procedure: INTRAMEDULLARY (IM) NAIL FEMORAL;  Surgeon: Deeann Saint, MD;  Location: [facility] ORS;  Service: Orthopedics;  Laterality: Left;   IR IVC FILTER PLMT / S&I /IMG GUID/MOD SED  12/08/2017   Patient Active Problem List   Diagnosis Date Noted   Age-related osteoporosis without current pathological fracture 07/27/2022   Mild recurrent major depression (HCC) 07/27/2022   Hypertensive heart disease with other congestive heart failure (HCC)  02/14/2022   Chronic indwelling Foley catheter 02/14/2022   Closed hip fracture (HCC) 08/15/2021   DVT (deep venous thrombosis) (HCC) 08/15/2021   Quadriplegia (HCC) 08/15/2021   Fall 08/15/2021   Constipation due to slow transit 08/31/2018   Trauma 06/05/2018   Neuropathic pain 06/05/2018   Neurogenic bowel 06/05/2018   Vaginal yeast infection 01/30/2018   Healthcare-associated pneumonia 01/25/2018   Chronic allergic rhinitis 01/24/2018   Depression with anxiety 01/24/2018   UTI due to Klebsiella species 01/24/2018   Protein-calorie malnutrition, severe (HCC) 01/24/2018   S/P insertion of IVC (inferior vena caval) filter 01/24/2018   Tetraparesis (HCC) 01/20/2018   Neurogenic bladder 01/20/2018   Reactive depression    Benign essential HTN    Acute postoperative anemia due to expected blood loss    Central cord syndrome at C6 level of cervical spinal cord (HCC) 11/29/2017   Allergy to alpha-gal 11/25/2016   SCC (squamous cell carcinoma) 04/08/2014   ONSET DATE: 11/29/2017  REFERRING DIAG: Central Cord Syndrome at C6 of the Cervical Spinal Cord, Fall with Bilateral Closed Hip Fractures, with ORIF repair with Intramedullary Nailing of the Left Hip Fracture.   THERAPY DIAG:  Muscle weakness (generalized)  Other lack of coordination  Rationale for Evaluation and Treatment: Rehabilitation  SUBJECTIVE:    SUBJECTIVE STATEMENT:  Pt. reports having had a good weekend  Pt accompanied by: self, Personal Care aide  PERTINENT  HISTORY: Central Cord Syndrome at C6 of the Cervical Spinal Cord, Fall with Bilateral Closed Hip Fractures, with ORIF repair with Intramedullary Nailing of the Left Hip Fracture.   PRECAUTIONS: None  WEIGHT BEARING RESTRICTIONS: No  PAIN:  Are you having pain? No pain, just shoulder stiffness   LIVING ENVIRONMENT: Lives with: [facility]  Stairs: No Has following equipment at home: Wheelchair (power)  PLOF: Independent    PATIENT GOALS:  See below for established goals  OBJECTIVE:  Note: Objective measures were completed at Evaluation unless otherwise noted.  HAND DOMINANCE: Right  ADLs: Caregiver assist with ADLs, and [facility]  MOBILITY STATUS: Uses a power w/c  ACTIVITY TOLERANCE: Activity tolerance: Fair  FUNCTIONAL OUTCOME MEASURES:  Measurements:   12/09/2021:   Shoulder flexion: R: 120(136), L: 138(150) Shoulder abduction: R: 108(120), L: 110(120) Elbow: R: 0-148, L: 0-146 Wrist flexion: R: 55(62), L: 78 Writ extension: R: 34(55), L: 3(4) RD: R: 12(20), L: 16(26) UD: R: 8(24), L: 6(18) Thumb radial abduction: R: 11(20), L: 8(20) Digit flexion to the [facility]: R:  2nd: 5cm(3.5cm), 3rd: 7.5 cm(5cm), 4th: 7cm(3cm), 5th: 6cm(5.5cm) L: 2nd: 8cm(8cm), 3rd: 8cm(8cm), 4th: 7.5cm(7cm), 5th: 7cm(7cm)   02/03/2022:   Shoulder flexion: R: 122(138), L: 148(154) Shoulder abduction: R: 109(125), L: 125(125 Elbow: R: 0-148, L: 0-146 Wrist flexion: R: 60(68), L: 79 Writ extension: R: 40(55), L: 3(10) RD: R: 14(24), L: 20(28) UD: R: 8(24), L: 6(18) Thumb radial abduction: R: 20(25), L: 8(20) Digit flexion to the [facility]: R:  2nd: 4.5cm(3cm), 3rd: 6.5cm(4.5cm), 4th: 6.5 cm(3cm), 5th: 4.5cm(5cm) L: 2nd: 8cm(8cm), 3rd: 8cm(7cm), 4th: 7.5cm(7cm), 5th: 6.5cm(6.5cm)   04/07/2022:   Shoulder flexion: R: 125(150), L: 160(160) Shoulder abduction: R: 109(125), L: 125(125 Elbow: R: 0-148, L: 0-146 Wrist flexion: R: 60(68), L: 79 Writ extension: R: 40(55), L: 5(18) RD: R: 14(24), L: 20(28) UD: R: 18(24), L: 8(18) Thumb radial abduction: R: 20(25), L: 8(20) Digit flexion to the [facility]: R:  2nd: 4.5 cm(4 cm), 3rd: 6.5cm(3cm), 4th: 7 cm (5cm), 5th: 5cm(5cm) L: 2nd: 8cm(8cm), 3rd: 8cm(7cm), 4th: 7.5cm(7cm), 5th: 7cm(7cm)   07/05/2022:   Shoulder flexion: R: 125(154), L: 160(160) Shoulder abduction: R: 95(130), L: 143(143) Elbow: R: 0-148, L: 0-146 Wrist flexion: R: 60(68), L: 79 Writ extension: R:  40(60), L: 0(12) RD: R: 10(24), L: 12(20) UD: R: 16(24), L: 8(16) Thumb radial abduction: R: 20(25), L: 8(20) Digit flexion to the [facility]: R:  2nd: 4 cm(4cm), 3rd: 6.5cm( 3.5cm), 4th: 7 cm (5cm), 5th: 6cm(5cm) L: 2nd: 8cm(8cm), 3rd: 8cm(7cm), 4th: 8cm(7cm), 5th: 7cm(7cm)   09/13/2022:   Shoulder flexion: R: 125(154), L: 160(160) Shoulder abduction: R: 100(130), L: 143(143) Elbow: R: 0-150, L: 0-150 Wrist flexion: R: 55(68), L: 79 Writ extension: R: 45(60), L: 0(12) RD: R: 10(24), L: 12(20) UD: R: 16(24), L: 8(16) Thumb radial abduction: R: 20(25), L: 8(20) Digit flexion to the [facility]: R:  2nd: 4 cm(4cm), 3rd: 6cm( 5cm), 4th: 6 cm (4cm), 5th: 5cm(5cm) L: 2nd: 7cm(cm), 3rd: 7cm(6cm), 4th: 7cm(6cm), 5th: 6cm(6cm)   10/27/2022   Shoulder flexion: R: 128(154), L: 160(160) Shoulder abduction: R: 105(130), L: 143(143) Elbow: R: 0-150, L: 0-150 Wrist flexion: R: 55(68), L: 79 Writ extension: R: 46(64), L: 0(20) RD: R: 10(20), L: 18(24) UD: R: 18(28), L: 8(20) Thumb radial abduction: R: 30(42), L:20(24) Digit flexion to the [facility]: R:  2nd: 4 cm(3cm), 3rd: 6cm( 6cm), 4th: 6 cm (5cm), 5th: 5cm(5cm) L: 2nd: 8cm(8cm), 3rd: 8cm(6cm), 4th: 8cm(7cm), 5th: 6cm(6cm)  01/19/2023  Shoulder flexion: R: 131(154), L: 160(160) Shoulder abduction: R: 110(130), L: 143(143) Elbow: R: 0-150, L: 0-150 Wrist flexion: R: 55(68), L: 79 Writ extension: R: 46(64), L: 0(20) RD: R: 10(20), L: 18(24) UD: R: 18(28), L: 14(20) Thumb radial abduction: R: 38(48), L:24(26) Thumb IP flexion: R: 35(50) L: 20(45) Digit flexion to the [facility]: R:  2nd: 3.5 cm(3cm), 3rd: 6cm( 6cm), 4th: 6 cm (5cm), 5th: 5cm(4.5cm) L: 2nd: 8cm(7.5cm), 3rd: 8cm(6cm), 4th: 7.5cm(6cm), 5th: 6cm(4.5cm)  04/04/2023  Shoulder flexion: R: 134(154), L: 160(160) Shoulder abduction: R: 114(130), L: 143(143) Elbow: R: WNL, L: WNL Wrist flexion: R: 55(68), L: 80 Writ extension: R: 46(64), L: -10(10) RD: R: 14(24), L: 20(24) UD: R: 20(30), L:  14(20) Thumb radial abduction: R: 38(48), L:28(30) Thumb IP flexion: R: 40(50) L: 25(45) Digit flexion to the [facility]: R:  2nd: 3.5 cm(3cm), 3rd: 6cm( 5cm), 4th: 6 cm (4cm), 5th: 5cm(5cm) L: 2nd: 8cm(7.5cm), 3rd: 8cm(7cm), 4th: 7.5cm(7cm), 5th: 7cm(6cm)   COORDINATION:   01/19/2023:  9 hole peg test:  Right: 2 pegs placed in 1 min. & 5 sec.  Pt. was able to remove 9 pegs in 17 sec.  Left: 2 pegs placed in 1 min. & 10 sec. Pt. was able to remove 9 pegs in 20 sec.  04/04/23:  Pt. was able to consistently grasp the pegs however each one slipped out of her fingers when attempting to place them into the Pegboard.  SENSATION: Intact  EDEMA: N/A   COGNITION: Overall cognitive status: WNL   VISION: Subjective report: Wears glasses, No changes in vision.  PERCEPTION: Intact  TODAY'S TREATMENT:                                                                                                                              DATE:  04/18/23   Manual Therapy:   Pt. tolerated soft tissue massage to the volar, and dorsal surface of each digit on the bilateral hands, digit lateral, and sagittal bands 2/2 to stiffness following moist heat modality. Soft tissue mobilization was performed for carpal, and metacarpal spread stretches. Manual therapy was performed independent of, and in preparation for therapeutic Ex.     Therapeutic Ex.    -Pt. tolerated PROM followed by AROM bilateral wrist extension, PROM for bilateral digit MP, PIP, and DIP flexion, and extension, thumb abduction.  -Pt. worked on [employer], and reciprocal motion using the UBE while seated for 8 min. with no resistance. Constant monitoring was provided.  -worked on completing  tendon glide exercises with the right hand in preparation for formulating a composite fist.    PATIENT EDUCATION:  Education details: Bilateral hand function, ROM, and strengthening, reaching, and [facility]  Person educated: Patient Education method:  Explanation, Demonstration, and Verbal cues Education comprehension: verbalized understanding and returned demonstration  HOME EXERCISE PROGRAM: Continue to assess HEP needs, and provide as needed.   GOALS: Goals reviewed with patient? Yes  LONG TERM GOALS: Target date: 04/13/2023  3.  Patient will demonstrate improved composite finger flexion to be able to firmly hold and use adaptive devices during ADLs, and IADLs.  Baseline: 04/04/2023: Pt. continues to work on improving Digit flexion to the [facility] to be able to efficiently handle Adaptive equipment. 01/19/2023: Pt. Is improving with Bilateral digit flexion to the [facility]. Pt. has difficulty firmly holding adaptive devices. 12/06/2022: Pt. continues to present with increased MP, PIP, and DIP digit tightness/stiffness limiting the formulation of bilateral composites fists. 10/27/2022: Pt. presents with increased MP, PIP, and DIP digit tightness/stiffness limiting the formulation of bilateral composites fists during this progress reporting period limiting. Digit flexion to the [facility]: R:  2nd: 4 cm(3cm), 3rd: 6cm( 6cm), 4th: 6 cm (5cm), 5th: 5cm(5cm), L: 2nd: 8cm(8cm), 3rd: 8cm(6cm), 4th: 8cm(7cm), 5th: 6cm(6cm) 09/20/2022: Improving digit flexion to the [facility]. 09/13/22: improved digit flexion to the [facility]. 07/05/2022: Pt. presents with digit MP, PIP, and DIP extensor tightness limiting her ability to securely grip objects in her bilateral hands.  04/07/2022: Pt. Has improved with right 2nd, and 3rd digit flexion towards the [facility]. Pt. Continues to have difficulty securely holding and applying deodorant.02/03/2022: Pt. Has improved with digit flexion, however, continues to have difficulty securely holding and using deodorant. 12/09/2021: Bilateral hand/digit MP, PIP, and DIP extension tightness limits her ability achieve digit flexion to hold, and apply deodorant. 10/28/2021:  Pt. continues to have difficulty holding the deodorant. 01/12/2021: Pt. Presents with limited digit  extension. Pt. Is able to initiate holding deodorant, however is unable to hold it while using it  Goal status: Ongoing   4.  Pt. Will improve bilateral wrist extension in preparation for anticipating, and initiating reaching for objects at the table.  Baseline: 04/04/23: R: 46(64) L: -10(10)01/19/2023:  R: 46(64) L: 0(20) 12/06/2022: Pt continues to to progress with bilateral wrist extension in preparation fro functional reaching. 10/27/2022: Pt. Is improving with bilateral wrist extension. R: 46(64) L: 0(20) 09/20/2022: limited PROM in the Left wrist extension 2/2 tightness/stiffness. 09/13/22: R: 55(68), L: 0(12) 07/19/2022: Bilateral wrist extension in limited. 07/05/2022: Right: 40(60) Left: 0(12) 04/07/2022: Right: 40(55) Left: 5(18) 02/03/2022: Right: 34(55) Left: 3(4) 12/09/2021: Right  34(55)  Left  3(4) 10/28/2021:  Right 22(38), Left 0(15) 09/14/2021:  Right: 17(35), left 2(15)  Goal status: Left wrist tightness noted. Ongoing  7.  Pt. Will increase bilateral lateral pinch strength by 2 lbs to be able to securely grasp items during ADLs, and IADL tasks.  Baseline: 01/19/2023: Deferred as the Pinch meter is out for calibration.12/06/2022: Pt. Is able to more securely hold objects during ADLs/IADLs 10/27/2022: Pt. Is improving with holding items with her bilateral thumbs. (Pinch meter out for calibration) 09/20/2022: lateral pinch continues to be limited 09/13/22: R 6.5# L 4# 07/19/2022: TBD 07/05/2022: TBD 07/2022: NT-Pinch meter out for calibration. 02/03/2022: Right: 6#, Left: 4# 12/09/2021: Right: 6#, Left: 4#  Goal status: Deferred   9.  Pt. will complete plant care with modified independence.  Baseline:12/06/2022: Pt. Is independent with plant care in her husband's room, however is not able to access her plants due to furniture blocking access to them. 10/27/2022: Pt. Is able to water plants that are closer, and within reach. Pt. continues to have difficulty reaching for thorough plant care. 09/20/2022: Pt.  Continues to have difficulty reaching for thorough plant care. 09/13/22: continues to report intermittent difficulty 07/19/2022: Pt. Continues to be able to water, and care for some of her plants. Pt. Has more difficulty with plants that are harder to reach.  07/05/2022: pt. Is able to water, and care for some of her plants. Pt. Has more difficulty with plants that are harder to reach. 04/07/2022: Pt. is now able to hold a cup and water her plants.02/03/2022: Pt. has difficulty caring for her plants.  Goal status:Achieved   10.  Pt. will demonstrate adaptive techniques to assist with the efficiency of self-dressing, or morning care tasks.  Baseline: 04/04/2023:  Pt. continues to work on improving UE functioning to be able to efficiently use Adaptive equipment. 01/19/2203: Pt. Is able to donn her jacket in reverse independently. Pt. requires assist from staff, as Pt. Reports staff have decreased time. 12/06/2022: Pt. requires assist from staff, as Pt. Reports staff have decreased time. 10/27/2022: Pt. is able to assist with initiating UE dressing. Pt. requires assist from personal, and staff care aides. 09/20/2022: Pt. continues to require assist form personal, and staff care aides. 09/13/22: MODA dressing, reports she is often rushed 07/19/2022: Pt. continues to require assist with self-dressing/morning care tasks. 07/05/2022: Continue with goal. 04/07/2022: Pt. Continues to require assist with the efficiency of self-dressing, and morning care tasks.02/03/2022: Pt. requires assist from caregivers 2/2 time limitations during morning care.  Goal status: Ongoing  11.  Pt. will improve BUE strength to improve ADL, and IADL functioning.  Baseline: 04/04/2023: shoulder flexion L 4/5, R 3+/5; shoulder abduction L 4+/5, R 3+/5. elbow flexion B 5/5, elbow extension 4/5 01/19/2023: L 4/5, R 3+/5 12/06/2022: Continue 10/27/2022: shoulder flexion L 4/5, R 3+/5; shoulder abduction L 4+/5, R 3+/5. elbow flexion B 5/5, elbow extension 4/5  09/20/2022: Pt. Presents with limited BUE strength 09/13/22: shoulder flexion L 4/5, R 3/5; elbow flexion B 5/5, elbow extension 4-/5; shoulder abduction L 4+/5, R 3+/5. 07/19/2022: BUE strength continues to be limited. 07/05/2022: shoulder flexion: right 4-/5, abduction: 3+/5, elbow flexion: right: 5/5, left 5/5, extension: right: 4-/5, left 4-/5, wrist extension: right: 3-/5, left: 2-/5  Goal status: Ongoing  12.  Pt. will improve bilateral thumb radial abduction in order to be able to hold the grab bars while standing with PT  Baseline: 04/04/23: R: 38(48), V:25(36) 01/19/2023: R: 38(48) L: 24(26)12/06/2022: Pt. Continues to present  with limited thumb abduction, however is improving holding onto the parallel bars.10/27/2022: thumb radial abduction: R: 30(42), L: 20(24  Goal status: Ongoing  13.  Pt. Will be able to securely hold, and stabilize medication bottles at a tabletop surface when opening, and closing them.  Baseline: 01/19/2023: Pt. Is now able to securely hold medication bottles while opening them, and stabilizes them on surfaces while opening them. 12/06/2022: Pt is improving with securely stabilizing medication bottles. 10/27/2022: Pt. Stabilizes bottles against her torso when attempting to open, and close them   Goal status: Achieved  14: Pt. Will improve bilateral thumb IP flexion to improve active grasping patterns.    Baseline: 04/04/2023:  R: 40(50) L: 25(45) 01/19/2023: Right 35(50), Left: 20/45)    Goal status: Ongoing    15. Pt. Will improve bilateral FMC/speed, and dexterity. As evidence by improved scores on the 9 hole peg test.  Baseline: 04/04/23: Pt. was able to consistently grasp the pegs however each one slipped out of her fingers when attempting to place them into the Pegboard. 01/19/2023: 2 pegs placed in 1 min. & 5 sec.  Pt. was able to remove 9 pegs in 17 sec.; Left: 2 pegs placed in 1 min. & 10 sec. Pt. was able to remove 9 pegs in 20 sec.   Goal status:  Ongoing  ASSESSMENT:  CLINICAL IMPRESSION:  Pt. required cues, and hand-over-hand assist to perform tendon glide exercises, and stretches. Pt. tolerated UE strengthening well. Pt. continues to benefit from OT services to work on impoving ROM, and UE strength in order to work towards increasing bilateral hand grasp on objects, and increasing engagement of bilateral hands during ADLs, and IADL tasks.     PERFORMANCE DE PERFORMANCE DEFICITS: in functional skills including ADLs, IADLs, coordination, dexterity, ROM, strength, and UE functional use, cognitive skills including , and psychosocial skills including coping strategies and environmental adaptation.   IMPAIRMENTS: are limiting patient from ADLs, IADLs, and leisure.   CO-MORBIDITIES: may have co-morbidities  that affects occupational performance. Patient will benefit from skilled OT to address above impairments and improve overall function.  MODIFICATION OR ASSISTANCE TO COMPLETE EVALUATION: Min-Moderate modification of tasks or assist with assess necessary to complete an evaluation.  OT OCCUPATIONAL PROFILE AND HISTORY: Detailed assessment: Review of records and additional review of physical, cognitive, psychosocial history related to current functional performance.  CLINICAL DECISION MAKING: Moderate - several treatment options, min-mod task modification necessary  REHAB POTENTIAL: Good for stated goals  PLAN:  OT FREQUENCY 2x's a week  OT DURATION: 12 weeks  PLANNED INTERVENTIONS ADL training, A/E training, UE ther. Ex, Manual therapy, neuromuscular re-education, moist heat modality, Paraffin Bath, Splinting, and  Pt./caregiver education   RECOMMENDED OTHER SERVICES: PT  CONSULTED AND AGREED WITH PLAN OF CARE: Patient  PLAN FOR NEXT SESSION: Continue Treatment as per established POC  Olegario Messier, MS, OTR/L  04/18/23, 11:55 AM

## 2023-04-18 NOTE — Therapy (Signed)
 OUTPATIENT PHYSICAL THERAPY NEURO TREATMENT    Patient Name: Sherry [name] MRN: [id number] DOB:1934-11-03, 89 y.o., female Today's Date: 04/18/2023   PCP: Earnestine Mealing, MD REFERRING PROVIDER: Deeann Saint   PT End of Session - 04/18/23 1309     Visit Number 104    Number of Visits 124    Date for PT Re-Evaluation 06/20/23    Authorization Type [employer] FOTO performed by PT on eval (7/20), score 12, Progress note on 03/10/2020; PN on 08/20/2020    Authorization Time Period Initial cert 05/10/8117-14/78/2956; Recert 01/11/2022-04/05/2022; Recert 04/07/2022-06/30/2022    Progress Note Due on Visit 110    PT Start Time 1145    PT Stop Time 1226    PT Time Calculation (min) 41 min    Equipment Utilized During Treatment Gait belt    Activity Tolerance Patient tolerated treatment well    Behavior During Therapy WFL for tasks assessed/performed                     Past Medical History:  Diagnosis Date   Acute blood loss anemia    Arthritis    Cancer (HCC)    skin   Central cord syndrome at C6 level of cervical spinal cord (HCC) 11/29/2017   Hypertension    Protein-calorie malnutrition, severe (HCC) 01/24/2018   S/P insertion of IVC (inferior vena caval) filter 01/24/2018   Tetraparesis (HCC)    Past Surgical History:  Procedure Laterality Date   ANTERIOR CERVICAL DECOMP/DISCECTOMY FUSION N/A 11/29/2017   Procedure: Cervical five-six, six-seven Anterior Cervical Decompression Fusion;  Surgeon: Jadene Pierini, MD;  Location: [facility] OR;  Service: Neurosurgery;  Laterality: N/A;  Cervical five-six, six-seven Anterior Cervical Decompression Fusion   CATARACT EXTRACTION     FEMUR IM NAIL Left 08/16/2021   Procedure: INTRAMEDULLARY (IM) NAIL FEMORAL;  Surgeon: Deeann Saint, MD;  Location: [facility] ORS;  Service: Orthopedics;  Laterality: Left;   IR IVC FILTER PLMT / S&I /IMG GUID/MOD SED  12/08/2017   Patient Active Problem List   Diagnosis Date Noted   Age-related  osteoporosis without current pathological fracture 07/27/2022   Mild recurrent major depression (HCC) 07/27/2022   Hypertensive heart disease with other congestive heart failure (HCC) 02/14/2022   Chronic indwelling Foley catheter 02/14/2022   Closed hip fracture (HCC) 08/15/2021   DVT (deep venous thrombosis) (HCC) 08/15/2021   Quadriplegia (HCC) 08/15/2021   Fall 08/15/2021   Constipation due to slow transit 08/31/2018   Trauma 06/05/2018   Neuropathic pain 06/05/2018   Neurogenic bowel 06/05/2018   Vaginal yeast infection 01/30/2018   Healthcare-associated pneumonia 01/25/2018   Chronic allergic rhinitis 01/24/2018   Depression with anxiety 01/24/2018   UTI due to Klebsiella species 01/24/2018   Protein-calorie malnutrition, severe (HCC) 01/24/2018   S/P insertion of IVC (inferior vena caval) filter 01/24/2018   Tetraparesis (HCC) 01/20/2018   Neurogenic bladder 01/20/2018   Reactive depression    Benign essential HTN    Acute postoperative anemia due to expected blood loss    Central cord syndrome at C6 level of cervical spinal cord (HCC) 11/29/2017   Allergy to alpha-gal 11/25/2016   SCC (squamous cell carcinoma) 04/08/2014    ONSET DATE: 08/15/2021 (fall with B hip fx); Initial injury was in 2019- quadraplegia due to central cord syndrome of C6 secondary to neck fracture.   REFERRING DIAG: Bilateral Hip fx; Left Open- s/p Left IM nail femoral on 08/16/2021; Right closed  THERAPY DIAG:  Muscle weakness (generalized)  Other lack of coordination  Central cord syndrome, sequela (HCC)  Other abnormalities of gait and mobility  Difficulty in walking, not elsewhere classified  Closed fracture of both hips, initial encounter (HCC)  Abnormality of gait and mobility  Unsteadiness on feet  Rationale for Evaluation and Treatment Rehabilitation  SUBJECTIVE:   SUBJECTIVE STATEMENT:   Patient reports she went to get new brace evaluation and states brace will be done in  approx 5 weeks. States doing okay otherwise.        Pt accompanied by:  Paid caregiver-   PERTINENT HISTORY: Sherry [name] is an 87yoF who experienced a fall at home on 08/15/2021 with hip fracture, s/p Left hip ORIF. PMH: quadriplegia due to central cord syndrome of C6 secondary to neck fracture, HTN, depression with anxiety, neurogenic bladder, bilateral DVT on Xarelto (s/p of IVC filter placement). Prior history of significant PT/OT with improvement of function. Pt has full use of shoulder/elbows, limited use of hands, adaptive self feeding sucessful.     PAIN:  None current but does endorse some left thigh pain intermittent  OBJECTIVE:    TODAY'S TREATMENT:   04/18/2023    Therex:  Active reclined hip abd (minimal active ability with knees flexed) x 15 reps Reclined hip flex into heel slide- AAROM 2 x 12 reps each LE Manual resistive hip add squeeze- hold 5 sec 2 x 10 reps  Reclined hip abd GTB on Left and RTB on right = 2 sets x 12 reps  Seated LAQ (AROM RLE and RTB on LLE) 2 sets of 12 reps each LE Active quad sets ( hold 5 sec) - 2set of 12 reps each LE Resistive manual Leg press- 2 sets of 12 reps. AAROM Seated Hip flex 2 sets of 15 reps (for full ROM)     PATIENT EDUCATION: Education details: Exercise technique, Discussion of how to obtain order/F2F for a new brace Patient verbalized understanding.   HOME EXERCISE PROGRAM: No changes at this time  Access Code: [facility] URL: [email or web address] Date: 11/23/2021 Prepared by: Precious Bard Exercises - Seated Gluteal Sets - 2 x daily - 7 x weekly - 2 sets - 10 reps - 5 hold - Seated Quad Set - 2 x daily - 7 x weekly - 2 sets - 10 reps - 5 hold - Seated Long Arc Quad - 2 x daily - 7 x weekly - 2 sets - 10 reps - 5 hold - Seated March - 2 x daily - 7 x weekly - 2 sets - 10 reps - 5 hold - Seated Hip Abduction - 2 x daily - 7 x weekly - 2 sets - 10 reps - 5 hold - Seated Shoulder Shrugs - 2 x daily - 7 x weekly -  2 sets - 10 reps - 5 hold - Wheelchair Pressure Relief - 2 x daily - 7 x weekly - 2 sets - 10 reps - 5 hold  GOALS: Goals reviewed with patient? Yes  SHORT TERM GOALS: Target date: 02/22/2022  Pt will be independent with initial UE strengthening HEP in order to improve strength and balance in order to decrease fall risk and improve function at home and work. Baseline: 10/19/2021- patient with no formal UE HEP; 12/02/2021= Patient verbalized knowledge of HEP including use of theraband for UE strengthening.  Goal status: GOAL MET  LONG TERM GOALS: Target date: 06/20/2023  Pt will be independent with final for UE/LE HEP in order to improve strength and balance in order to decrease fall  risk and improve function at home and work. Baseline: Patient is currently BLE NWB and unable to participate in HEP. Has order for UE strengthening. 01/11/2022- Patient now able to participate in LE strengthening although NWB still- good understanding for some basic exercises. Will keep goal active to incorporate progressive LE strengthening exercises. 04/07/2022= Patient still participating in progressive LE seated HEP and has no questions at this time.  Goal status: MET  2.  Pt will improve FOTO to target score of 35  to display perceived improvements in ability to complete ADL's.  Baseline: 10/19/2021= 12; 12/02/2021= 17; 01/11/2022=17; 04/07/2022= 17; 07/05/2022=17; 10/06/2022- outcome measure not appropriate as patient in non-ambulatory and not consistently standing. Goal status: Goal not appropriate  3.  Pt will increase strength of B UE  by at least 1/2 MMT grade in order to demonstrate improvement in strength and function  Baseline: patient has range of 2-/5 to 4/5 BUE Strength; 12/02/2021= 4/5 except with wrist ext. 01/11/2022= 4/5 B UE strength expept for wrist Goal status: Goal revised-no longer appropriate- working with OT on all UE strengthening.   4. Pt. Will increase strength of RLE by at least 1/2 MMT grade in  order to demonstrate improvement in Standing/transfers.  Baseline: 2-/5 Right hip flex/knee ext/flex; 04/07/2022= 2- with right hip flex/knee ext (lacking 28 deg from zero) 4/8= Patient able to ext right knee lacking 18 deg from zero. 07/05/2022=Left knee approx 8 deg from zero and right knee = 23 deg from zero; 12/29/2022 - Left LE = full ROM 4-/5 and right knee ext= 23 deg from zero= 2+/5 (gravity minimized); 03/28/2023= Left knee ext=  4/5 and right knee ext= 3-/5 (able to move lower leg through incomplete ROM -lacking 20 deg from full ROM against gravity)  Goal status: Progressing  5. Pt. Will demo ability to stand pivot transfer with max assist for improved functional mobility and less dependent need on mechanical device.   Baseline: Dependent on hoyer lift for all transfers; 04/07/2022- Unable to test secondary to patient with recent UTI and right hand procedure - will attempt to reassess next visit. ; 04/12/22: Unable to successfully stand due to feet plates on loaned power w/c; 05/10/2022- Patient still having to use rental chair and has not received her original power w/c back to practice SPT. 07/04/2021= Patient just received new power w/c and attempted standing from 1st time today- Patient able to stand with max assist today from w/c and did not attempt to perform pivot transfer- difficulty with static standing and placing weight on right LE. 10/06/2022- Not assessed today- patient needs new AFO prior to attempting transfers; 12/29/2022= Patient has not attempted SPT yet - still trying to improve LE strength enough to stand well without knee buckling with weight shifting. 03/28/2023- not attempted today due to ill fitting brace but patient last performed SPT on 01/10/23 and was able to perform with Max A with feet pre-positioned to angle toward direction of transfer  Goal status: ONGOING  6. Pt. Will demonstrate improved functional LE strength as seen by ability to stand > 2 min for improved transfer ability and  pregait abilities.   Baseline: not assessed today due to recent dx: UTI; 04/07/2022- Unable to test secondary to patient with recent UTI and right hand procedure - will attempt to reassess next visit; 04/12/22: Unable to successfully stand due to feet plates on loaned power w/c 05/10/2022- Patient still having to use rental chair and has not received her original power w/c back to practice  Standing. 07/05/2022- Patient able to stand with max assist at trunk today for 2 min 3 sec. Will keep goal active to ensure consistency. 10/06/2022= Unable to assess today- patient with some recent blood pressure issues and needs new AFO. 11/24/2022= Patient demonstrated multiple rounds of standing in // bars- holding up to 1:45 min today but has been able to exhibit 2 min in recent past- working on consistency. 12/29/2022= 2 min 40 sec with patient demonstrating improved overall posture and ability to contract glutes and quads with just min assist at trunk today.   Goal status: MET  7. Pt. Will demonstrate improved functional LE strength as seen by ability to stand > 5 min for improved transfer ability and pregait abilities. Baseline: 12/29/2022= 2 min 40 sec with patient demonstrating improved overall posture and ability to contract glutes and quads with just min assist at trunk today. 03/28/2023= Attempted 2 trials today - at best 2 min 48 sec today but during cert- did stand for 3 min 44 sec.  Goal Status: Ongoing- Patient will likely receive new AFO in upcoming weeks and anticipate more standing time once received.  8. Patient will perform sit to stand transfer with moderate assist consistently > 75% of time for improved transfer ability and less dependence on caregiver.   Baseline: 12/29/2022- Patient currently max assist level assistance with all sit to stand activities. 03/28/2023= Patient is still majority of max assist to stand yet able to pull up from wheelchair smoother in one motion.   Goal status:  ONGOING   ASSESSMENT:  CLINICAL IMPRESSION:   Treatment continued with progressive LE strengthening as patient was just instructed that her new brace would not be for 5 weeks. She performed well overall- some difficulty getting LE into positions and discussed plan to continue with short duration stand pivot transfers as long as no increased swelling left foot.  Pt will continue to benefit from skilled PT services to optimize independence and reduced caregiver support.   Will focus more on transfers, Sit to stand and standing once patient receives AFO.   OBJECTIVE IMPAIRMENTS Abnormal gait, decreased activity tolerance, decreased balance, decreased coordination, decreased endurance, decreased mobility, difficulty walking, decreased ROM, decreased strength, hypomobility, impaired flexibility, impaired UE functional use, postural dysfunction, and pain.   ACTIVITY LIMITATIONS carrying, lifting, bending, sitting, standing, squatting, sleeping, stairs, transfers, bed mobility, continence, bathing, toileting, dressing, self feeding, reach over head, hygiene/grooming, and caring for others  PARTICIPATION LIMITATIONS: meal prep, cleaning, laundry, medication management, personal finances, interpersonal relationship, driving, shopping, community activity, and yard work  PERSONAL FACTORS Age, Time since onset of injury/illness/exacerbation, and 1-2 comorbidities: HTN, cervical Sx  are also affecting patient's functional outcome.   REHAB POTENTIAL: Good  CLINICAL DECISION MAKING: Evolving/moderate complexity  EVALUATION COMPLEXITY: Moderate  PLAN: PT FREQUENCY: 1-2x/week  PT DURATION: 12 weeks  PLANNED INTERVENTIONS: Therapeutic exercises, Therapeutic activity, Neuromuscular re-education, Balance training, Gait training, Patient/Family education, Self Care, Joint mobilization, DME instructions, Dry Needling, Electrical stimulation, Wheelchair mobility training, Spinal mobilization, Cryotherapy, Moist  heat, and Manual therapy  PLAN FOR NEXT SESSION:  LE and core strength training, Sit to stand and static standing; progress to SPT as able.    2:15 PM, 04/18/23 Louis Meckel, PT Physical Therapist - [facility]  Outpatient Physical Therapy- [facility] [phone]

## 2023-04-20 ENCOUNTER — Ambulatory Visit: Payer: Medicare HMO | Admitting: Occupational Therapy

## 2023-04-20 ENCOUNTER — Ambulatory Visit: Payer: Medicare HMO

## 2023-04-20 DIAGNOSIS — M6281 Muscle weakness (generalized): Secondary | ICD-10-CM

## 2023-04-20 DIAGNOSIS — R2689 Other abnormalities of gait and mobility: Secondary | ICD-10-CM

## 2023-04-20 DIAGNOSIS — R278 Other lack of coordination: Secondary | ICD-10-CM

## 2023-04-20 DIAGNOSIS — S14129S Central cord syndrome at unspecified level of cervical spinal cord, sequela: Secondary | ICD-10-CM

## 2023-04-20 DIAGNOSIS — S72001A Fracture of unspecified part of neck of right femur, initial encounter for closed fracture: Secondary | ICD-10-CM

## 2023-04-20 DIAGNOSIS — R262 Difficulty in walking, not elsewhere classified: Secondary | ICD-10-CM

## 2023-04-20 NOTE — Therapy (Addendum)
 Occupational Therapy Neuro Treatment Note          Patient Name: Sherry [name] MRN: [id number] DOB:01/07/1935, 89 y.o., female Today's Date: 11/29/2022  PCP:  Cindi Carbon PROVIDER: Earnestine Mealing, MD  END OF SESSION:   OT End of Session - 04/20/23 1415     Visit Number [id number]    Number of Visits 192    Date for OT Re-Evaluation 06/27/23    Authorization Time Period Progress report period starting 04/04/23    OT Start Time 1100    OT Stop Time 1145    OT Time Calculation (min) 45 min    Activity Tolerance Patient tolerated treatment well    Behavior During Therapy [facility] for tasks assessed/performed                     Past Medical History:  Diagnosis Date   Acute blood loss anemia    Arthritis    Cancer (HCC)    skin   Central cord syndrome at C6 level of cervical spinal cord (HCC) 11/29/2017   Hypertension    Protein-calorie malnutrition, severe (HCC) 01/24/2018   S/P insertion of IVC (inferior vena caval) filter 01/24/2018   Tetraparesis (HCC)    Past Surgical History:  Procedure Laterality Date   ANTERIOR CERVICAL DECOMP/DISCECTOMY FUSION N/A 11/29/2017   Procedure: Cervical five-six, six-seven Anterior Cervical Decompression Fusion;  Surgeon: Jadene Pierini, MD;  Location: [facility] OR;  Service: Neurosurgery;  Laterality: N/A;  Cervical five-six, six-seven Anterior Cervical Decompression Fusion   CATARACT EXTRACTION     FEMUR IM NAIL Left 08/16/2021   Procedure: INTRAMEDULLARY (IM) NAIL FEMORAL;  Surgeon: Deeann Saint, MD;  Location: [facility] ORS;  Service: Orthopedics;  Laterality: Left;   IR IVC FILTER PLMT / S&I /IMG GUID/MOD SED  12/08/2017   Patient Active Problem List   Diagnosis Date Noted   Age-related osteoporosis without current pathological fracture 07/27/2022   Mild recurrent major depression (HCC) 07/27/2022   Hypertensive heart disease with other congestive heart failure (HCC) 02/14/2022   Chronic indwelling Foley catheter 02/14/2022    Closed hip fracture (HCC) 08/15/2021   DVT (deep venous thrombosis) (HCC) 08/15/2021   Quadriplegia (HCC) 08/15/2021   Fall 08/15/2021   Constipation due to slow transit 08/31/2018   Trauma 06/05/2018   Neuropathic pain 06/05/2018   Neurogenic bowel 06/05/2018   Vaginal yeast infection 01/30/2018   Healthcare-associated pneumonia 01/25/2018   Chronic allergic rhinitis 01/24/2018   Depression with anxiety 01/24/2018   UTI due to Klebsiella species 01/24/2018   Protein-calorie malnutrition, severe (HCC) 01/24/2018   S/P insertion of IVC (inferior vena caval) filter 01/24/2018   Tetraparesis (HCC) 01/20/2018   Neurogenic bladder 01/20/2018   Reactive depression    Benign essential HTN    Acute postoperative anemia due to expected blood loss    Central cord syndrome at C6 level of cervical spinal cord (HCC) 11/29/2017   Allergy to alpha-gal 11/25/2016   SCC (squamous cell carcinoma) 04/08/2014   ONSET DATE: 11/29/2017  REFERRING DIAG: Central Cord Syndrome at C6 of the Cervical Spinal Cord, Fall with Bilateral Closed Hip Fractures, with ORIF repair with Intramedullary Nailing of the Left Hip Fracture.   THERAPY DIAG:  Muscle weakness (generalized)  Other lack of coordination  Rationale for Evaluation and Treatment: Rehabilitation  SUBJECTIVE:    SUBJECTIVE STATEMENT:  Pt. Reports doing well today Pt accompanied by: self, Personal Care aide  PERTINENT HISTORY: Central Cord Syndrome at C6 of the Cervical Spinal Cord, Fall  with Bilateral Closed Hip Fractures, with ORIF repair with Intramedullary Nailing of the Left Hip Fracture.   PRECAUTIONS: None  WEIGHT BEARING RESTRICTIONS: No  PAIN:  Are you having pain? No pain, just shoulder stiffness   LIVING ENVIRONMENT: Lives with: [facility]  Stairs: No Has following equipment at home: Wheelchair (power)  PLOF: Independent   PATIENT GOALS:  See below for established goals  OBJECTIVE:  Note:  Objective measures were completed at Evaluation unless otherwise noted.  HAND DOMINANCE: Right  ADLs: Caregiver assist with ADLs, and [facility]  MOBILITY STATUS: Uses a power w/c  ACTIVITY TOLERANCE: Activity tolerance: Fair  FUNCTIONAL OUTCOME MEASURES:  Measurements:   12/09/2021:   Shoulder flexion: R: 120(136), L: 138(150) Shoulder abduction: R: 108(120), L: 110(120) Elbow: R: 0-148, L: 0-146 Wrist flexion: R: 55(62), L: 78 Writ extension: R: 34(55), L: 3(4) RD: R: 12(20), L: 16(26) UD: R: 8(24), L: 6(18) Thumb radial abduction: R: 11(20), L: 8(20) Digit flexion to the [facility]: R:  2nd: 5cm(3.5cm), 3rd: 7.5 cm(5cm), 4th: 7cm(3cm), 5th: 6cm(5.5cm) L: 2nd: 8cm(8cm), 3rd: 8cm(8cm), 4th: 7.5cm(7cm), 5th: 7cm(7cm)   02/03/2022:   Shoulder flexion: R: 122(138), L: 148(154) Shoulder abduction: R: 109(125), L: 125(125 Elbow: R: 0-148, L: 0-146 Wrist flexion: R: 60(68), L: 79 Writ extension: R: 40(55), L: 3(10) RD: R: 14(24), L: 20(28) UD: R: 8(24), L: 6(18) Thumb radial abduction: R: 20(25), L: 8(20) Digit flexion to the [facility]: R:  2nd: 4.5cm(3cm), 3rd: 6.5cm(4.5cm), 4th: 6.5 cm(3cm), 5th: 4.5cm(5cm) L: 2nd: 8cm(8cm), 3rd: 8cm(7cm), 4th: 7.5cm(7cm), 5th: 6.5cm(6.5cm)   04/07/2022:   Shoulder flexion: R: 125(150), L: 160(160) Shoulder abduction: R: 109(125), L: 125(125 Elbow: R: 0-148, L: 0-146 Wrist flexion: R: 60(68), L: 79 Writ extension: R: 40(55), L: 5(18) RD: R: 14(24), L: 20(28) UD: R: 18(24), L: 8(18) Thumb radial abduction: R: 20(25), L: 8(20) Digit flexion to the [facility]: R:  2nd: 4.5 cm(4 cm), 3rd: 6.5cm(3cm), 4th: 7 cm (5cm), 5th: 5cm(5cm) L: 2nd: 8cm(8cm), 3rd: 8cm(7cm), 4th: 7.5cm(7cm), 5th: 7cm(7cm)   07/05/2022:   Shoulder flexion: R: 125(154), L: 160(160) Shoulder abduction: R: 95(130), L: 143(143) Elbow: R: 0-148, L: 0-146 Wrist flexion: R: 60(68), L: 79 Writ extension: R: 40(60), L: 0(12) RD: R: 10(24), L: 12(20) UD: R: 16(24), L: 8(16) Thumb  radial abduction: R: 20(25), L: 8(20) Digit flexion to the [facility]: R:  2nd: 4 cm(4cm), 3rd: 6.5cm( 3.5cm), 4th: 7 cm (5cm), 5th: 6cm(5cm) L: 2nd: 8cm(8cm), 3rd: 8cm(7cm), 4th: 8cm(7cm), 5th: 7cm(7cm)   09/13/2022:   Shoulder flexion: R: 125(154), L: 160(160) Shoulder abduction: R: 100(130), L: 143(143) Elbow: R: 0-150, L: 0-150 Wrist flexion: R: 55(68), L: 79 Writ extension: R: 45(60), L: 0(12) RD: R: 10(24), L: 12(20) UD: R: 16(24), L: 8(16) Thumb radial abduction: R: 20(25), L: 8(20) Digit flexion to the [facility]: R:  2nd: 4 cm(4cm), 3rd: 6cm( 5cm), 4th: 6 cm (4cm), 5th: 5cm(5cm) L: 2nd: 7cm(cm), 3rd: 7cm(6cm), 4th: 7cm(6cm), 5th: 6cm(6cm)   10/27/2022   Shoulder flexion: R: 128(154), L: 160(160) Shoulder abduction: R: 105(130), L: 143(143) Elbow: R: 0-150, L: 0-150 Wrist flexion: R: 55(68), L: 79 Writ extension: R: 46(64), L: 0(20) RD: R: 10(20), L: 18(24) UD: R: 18(28), L: 8(20) Thumb radial abduction: R: 30(42), L:20(24) Digit flexion to the [facility]: R:  2nd: 4 cm(3cm), 3rd: 6cm( 6cm), 4th: 6 cm (5cm), 5th: 5cm(5cm) L: 2nd: 8cm(8cm), 3rd: 8cm(6cm), 4th: 8cm(7cm), 5th: 6cm(6cm)  01/19/2023  Shoulder flexion: R: 131(154), L: 160(160) Shoulder abduction: R: 110(130), L:  403(474) Elbow: R: 0-150, L: 0-150 Wrist flexion: R: 55(68), L: 79 Writ extension: R: 46(64), L: 0(20) RD: R: 10(20), L: 18(24) UD: R: 18(28), L: 14(20) Thumb radial abduction: R: 38(48), L:24(26) Thumb IP flexion: R: 35(50) L: 20(45) Digit flexion to the [facility]: R:  2nd: 3.5 cm(3cm), 3rd: 6cm( 6cm), 4th: 6 cm (5cm), 5th: 5cm(4.5cm) L: 2nd: 8cm(7.5cm), 3rd: 8cm(6cm), 4th: 7.5cm(6cm), 5th: 6cm(4.5cm)  04/04/2023  Shoulder flexion: R: 134(154), L: 160(160) Shoulder abduction: R: 114(130), L: 143(143) Elbow: R: WNL, L: WNL Wrist flexion: R: 55(68), L: 80 Writ extension: R: 46(64), L: -10(10) RD: R: 14(24), L: 20(24) UD: R: 20(30), L: 14(20) Thumb radial abduction: R: 38(48), L:28(30) Thumb IP flexion: R: 40(50)  L: 25(45) Digit flexion to the [facility]: R:  2nd: 3.5 cm(3cm), 3rd: 6cm( 5cm), 4th: 6 cm (4cm), 5th: 5cm(5cm) L: 2nd: 8cm(7.5cm), 3rd: 8cm(7cm), 4th: 7.5cm(7cm), 5th: 7cm(6cm)   COORDINATION:   01/19/2023:  9 hole peg test:  Right: 2 pegs placed in 1 min. & 5 sec.  Pt. was able to remove 9 pegs in 17 sec.  Left: 2 pegs placed in 1 min. & 10 sec. Pt. was able to remove 9 pegs in 20 sec.  04/04/23:  Pt. was able to consistently grasp the pegs however each one slipped out of her fingers when attempting to place them into the Pegboard.  SENSATION: Intact  EDEMA: N/A   COGNITION: Overall cognitive status: WNL   VISION: Subjective report: Wears glasses, No changes in vision.  PERCEPTION: Intact  TODAY'S TREATMENT:                                                                                                                              DATE:  04/20/23   Manual Therapy:   Pt. tolerated soft tissue massage to the volar, and dorsal surface of each digit on the bilateral hands, digit lateral, and sagittal bands 2/2 to stiffness following moist heat modality. Soft tissue mobilization was performed for carpal, and metacarpal spread stretches. Manual therapy was performed independent of, and in preparation for therapeutic Ex.     Therapeutic Ex.    -Pt. tolerated PROM followed by AROM bilateral wrist extension, PROM for bilateral digit MP, PIP, and DIP flexion, and extension, thumb abduction.  -Performed BUE strengthening using a 2# dowel ex. 2/2 to weakness. Bilateral shoulder flexion, chest press, circular patterns, and elbow flexion/extension for 1 set  10 reps each.   Neuromuscular re-education:    -Facilitated right hand [name] skills using a lateral grasp to remove 1" resistive cubes from a velcro resistive board.  -Encouraged isolated 2nd digit extension to press them into place. -The vertical board was placed at a vertical angle to encourage wrist extension.      PATIENT  EDUCATION:  Education details: Bilateral hand function, ROM, and strengthening, reaching, and [facility]  Person educated: Patient Education method: Explanation, Demonstration, and Verbal cues Education comprehension: verbalized understanding and returned demonstration  HOME EXERCISE PROGRAM: Continue to assess HEP needs, and provide as needed.   GOALS: Goals reviewed with patient? Yes  LONG TERM GOALS: Target date: 04/13/2023  3.  Patient will demonstrate improved composite finger flexion to be able to firmly hold and use adaptive devices during ADLs, and IADLs.  Baseline: 04/04/2023: Pt. continues to work on improving Digit flexion to the [facility] to be able to efficiently handle Adaptive equipment. 01/19/2023: Pt. Is improving with Bilateral digit flexion to the [facility]. Pt. has difficulty firmly holding adaptive devices. 12/06/2022: Pt. continues to present with increased MP, PIP, and DIP digit tightness/stiffness limiting the formulation of bilateral composites fists. 10/27/2022: Pt. presents with increased MP, PIP, and DIP digit tightness/stiffness limiting the formulation of bilateral composites fists during this progress reporting period limiting. Digit flexion to the [facility]: R:  2nd: 4 cm(3cm), 3rd: 6cm( 6cm), 4th: 6 cm (5cm), 5th: 5cm(5cm), L: 2nd: 8cm(8cm), 3rd: 8cm(6cm), 4th: 8cm(7cm), 5th: 6cm(6cm) 09/20/2022: Improving digit flexion to the [facility]. 09/13/22: improved digit flexion to the [facility]. 07/05/2022: Pt. presents with digit MP, PIP, and DIP extensor tightness limiting her ability to securely grip objects in her bilateral hands.  04/07/2022: Pt. Has improved with right 2nd, and 3rd digit flexion towards the [facility]. Pt. Continues to have difficulty securely holding and applying deodorant.02/03/2022: Pt. Has improved with digit flexion, however, continues to have difficulty securely holding and using deodorant. 12/09/2021: Bilateral hand/digit MP, PIP, and DIP extension tightness limits her ability achieve digit  flexion to hold, and apply deodorant. 10/28/2021:  Pt. continues to have difficulty holding the deodorant. 01/12/2021: Pt. Presents with limited digit extension. Pt. Is able to initiate holding deodorant, however is unable to hold it while using it  Goal status: Ongoing   4.  Pt. Will improve bilateral wrist extension in preparation for anticipating, and initiating reaching for objects at the table.  Baseline: 04/04/23: R: 46(64) L: -10(10)01/19/2023:  R: 46(64) L: 0(20) 12/06/2022: Pt continues to to progress with bilateral wrist extension in preparation fro functional reaching. 10/27/2022: Pt. Is improving with bilateral wrist extension. R: 46(64) L: 0(20) 09/20/2022: limited PROM in the Left wrist extension 2/2 tightness/stiffness. 09/13/22: R: 55(68), L: 0(12) 07/19/2022: Bilateral wrist extension in limited. 07/05/2022: Right: 40(60) Left: 0(12) 04/07/2022: Right: 40(55) Left: 5(18) 02/03/2022: Right: 34(55) Left: 3(4) 12/09/2021: Right  34(55)  Left  3(4) 10/28/2021:  Right 22(38), Left 0(15) 09/14/2021:  Right: 17(35), left 2(15)  Goal status: Left wrist tightness noted. Ongoing  7.  Pt. Will increase bilateral lateral pinch strength by 2 lbs to be able to securely grasp items during ADLs, and IADL tasks.  Baseline: 01/19/2023: Deferred as the Pinch meter is out for calibration.12/06/2022: Pt. Is able to more securely hold objects during ADLs/IADLs 10/27/2022: Pt. Is improving with holding items with her bilateral thumbs. (Pinch meter out for calibration) 09/20/2022: lateral pinch continues to be limited 09/13/22: R 6.5# L 4# 07/19/2022: TBD 07/05/2022: TBD 07/2022: NT-Pinch meter out for calibration. 02/03/2022: Right: 6#, Left: 4# 12/09/2021: Right: 6#, Left: 4#  Goal status: Deferred   9.  Pt. will complete plant care with modified independence.  Baseline:12/06/2022: Pt. Is independent with plant care in her husband's room, however is not able to access her plants due to furniture blocking access to them.  10/27/2022: Pt. Is able to water plants that are closer, and within reach. Pt. continues to have difficulty reaching for thorough plant care. 09/20/2022: Pt. Continues to have difficulty reaching for thorough plant care. 09/13/22: continues to report  intermittent difficulty 07/19/2022: Pt. Continues to be able to water, and care for some of her plants. Pt. Has more difficulty with plants that are harder to reach. 07/05/2022: pt. Is able to water, and care for some of her plants. Pt. Has more difficulty with plants that are harder to reach. 04/07/2022: Pt. is now able to hold a cup and water her plants.02/03/2022: Pt. has difficulty caring for her plants.  Goal status:Achieved   10.  Pt. will demonstrate adaptive techniques to assist with the efficiency of self-dressing, or morning care tasks.  Baseline: 04/04/2023:  Pt. continues to work on improving UE functioning to be able to efficiently use Adaptive equipment. 01/19/2203: Pt. Is able to donn her jacket in reverse independently. Pt. requires assist from staff, as Pt. Reports staff have decreased time. 12/06/2022: Pt. requires assist from staff, as Pt. Reports staff have decreased time. 10/27/2022: Pt. is able to assist with initiating UE dressing. Pt. requires assist from personal, and staff care aides. 09/20/2022: Pt. continues to require assist form personal, and staff care aides. 09/13/22: MODA dressing, reports she is often rushed 07/19/2022: Pt. continues to require assist with self-dressing/morning care tasks. 07/05/2022: Continue with goal. 04/07/2022: Pt. Continues to require assist with the efficiency of self-dressing, and morning care tasks.02/03/2022: Pt. requires assist from caregivers 2/2 time limitations during morning care.  Goal status: Ongoing  11.  Pt. will improve BUE strength to improve ADL, and IADL functioning.  Baseline: 04/04/2023: shoulder flexion L 4/5, R 3+/5; shoulder abduction L 4+/5, R 3+/5. elbow flexion B 5/5, elbow extension 4/5  01/19/2023: L 4/5, R 3+/5 12/06/2022: Continue 10/27/2022: shoulder flexion L 4/5, R 3+/5; shoulder abduction L 4+/5, R 3+/5. elbow flexion B 5/5, elbow extension 4/5 09/20/2022: Pt. Presents with limited BUE strength 09/13/22: shoulder flexion L 4/5, R 3/5; elbow flexion B 5/5, elbow extension 4-/5; shoulder abduction L 4+/5, R 3+/5. 07/19/2022: BUE strength continues to be limited. 07/05/2022: shoulder flexion: right 4-/5, abduction: 3+/5, elbow flexion: right: 5/5, left 5/5, extension: right: 4-/5, left 4-/5, wrist extension: right: 3-/5, left: 2-/5  Goal status: Ongoing  12.  Pt. will improve bilateral thumb radial abduction in order to be able to hold the grab bars while standing with PT  Baseline: 04/04/23: R: 38(48), V:78(46) 01/19/2023: R: 38(48) L: 24(26)12/06/2022: Pt. Continues to present  with limited thumb abduction, however is improving holding onto the parallel bars.10/27/2022: thumb radial abduction: R: 30(42), L: 20(24  Goal status: Ongoing  13.  Pt. Will be able to securely hold, and stabilize medication bottles at a tabletop surface when opening, and closing them.  Baseline: 01/19/2023: Pt. Is now able to securely hold medication bottles while opening them, and stabilizes them on surfaces while opening them. 12/06/2022: Pt is improving with securely stabilizing medication bottles. 10/27/2022: Pt. Stabilizes bottles against her torso when attempting to open, and close them   Goal status: Achieved  14: Pt. Will improve bilateral thumb IP flexion to improve active grasping patterns.    Baseline: 04/04/2023:  R: 40(50) L: 25(45) 01/19/2023: Right 35(50), Left: 20/45)    Goal status: Ongoing    15. Pt. Will improve bilateral FMC/speed, and dexterity. As evidence by improved scores on the 9 hole peg test.  Baseline: 04/04/23: Pt. was able to consistently grasp the pegs however each one slipped out of her fingers when attempting to place them into the Pegboard. 01/19/2023: 2 pegs placed in 1 min. &  5 sec.  Pt. was able to remove 9 pegs  in 17 sec.; Left: 2 pegs placed in 1 min. & 10 sec. Pt. was able to remove 9 pegs in 20 sec.   Goal status: Ongoing   ASSESSMENT:  CLINICAL IMPRESSION:  Pt. tolerated UE manual therapy, FMC skills,  and strengthening with the 2# dowel well this morning. Pt. was able to tolerate increased resistance with the resistive cubes.  Pt. Presents with difficulty formulating full isolated 2nd digit extension. Pt. continues to benefit from OT services to work on impoving ROM, and UE strength in order to work towards increasing bilateral hand grasp on objects, and increasing engagement of bilateral hands during ADLs, and IADL tasks.     PERFORMANCE DE PERFORMANCE DEFICITS: in functional skills including ADLs, IADLs, coordination, dexterity, ROM, strength, and UE functional use, cognitive skills including , and psychosocial skills including coping strategies and environmental adaptation.   IMPAIRMENTS: are limiting patient from ADLs, IADLs, and leisure.   CO-MORBIDITIES: may have co-morbidities  that affects occupational performance. Patient will benefit from skilled OT to address above impairments and improve overall function.  MODIFICATION OR ASSISTANCE TO COMPLETE EVALUATION: Min-Moderate modification of tasks or assist with assess necessary to complete an evaluation.  OT OCCUPATIONAL PROFILE AND HISTORY: Detailed assessment: Review of records and additional review of physical, cognitive, psychosocial history related to current functional performance.  CLINICAL DECISION MAKING: Moderate - several treatment options, min-mod task modification necessary  REHAB POTENTIAL: Good for stated goals  PLAN:  OT FREQUENCY 2x's a week  OT DURATION: 12 weeks  PLANNED INTERVENTIONS ADL training, A/E training, UE ther. Ex, Manual therapy, neuromuscular re-education, moist heat modality, Paraffin Bath, Splinting, and  Pt./caregiver education   RECOMMENDED OTHER SERVICES:  PT  CONSULTED AND AGREED WITH PLAN OF CARE: Patient  PLAN FOR NEXT SESSION: Continue Treatment as per established POC  Olegario Messier, MS, OTR/L  04/20/23, 2:19 PM

## 2023-04-20 NOTE — Therapy (Signed)
 OUTPATIENT PHYSICAL THERAPY NEURO TREATMENT    Patient Name: Sherry [name] MRN: [id number] DOB:Dec 01, 1934, 89 y.o., female Today's Date: 04/20/2023   PCP: Earnestine Mealing, MD REFERRING PROVIDER: Deeann Saint   PT End of Session - 04/20/23 1248     Visit Number 105    Number of Visits 124    Date for PT Re-Evaluation 06/20/23    Authorization Type [employer] FOTO performed by PT on eval (7/20), score 12, Progress note on 03/10/2020; PN on 08/20/2020    Authorization Time Period Initial cert 05/10/8117-14/78/2956; Recert 01/11/2022-04/05/2022; Recert 04/07/2022-06/30/2022    Progress Note Due on Visit 110    PT Start Time 1146    PT Stop Time 1230    PT Time Calculation (min) 44 min    Equipment Utilized During Treatment Gait belt    Activity Tolerance Patient tolerated treatment well    Behavior During Therapy WFL for tasks assessed/performed                     Past Medical History:  Diagnosis Date   Acute blood loss anemia    Arthritis    Cancer (HCC)    skin   Central cord syndrome at C6 level of cervical spinal cord (HCC) 11/29/2017   Hypertension    Protein-calorie malnutrition, severe (HCC) 01/24/2018   S/P insertion of IVC (inferior vena caval) filter 01/24/2018   Tetraparesis (HCC)    Past Surgical History:  Procedure Laterality Date   ANTERIOR CERVICAL DECOMP/DISCECTOMY FUSION N/A 11/29/2017   Procedure: Cervical five-six, six-seven Anterior Cervical Decompression Fusion;  Surgeon: Jadene Pierini, MD;  Location: [facility] OR;  Service: Neurosurgery;  Laterality: N/A;  Cervical five-six, six-seven Anterior Cervical Decompression Fusion   CATARACT EXTRACTION     FEMUR IM NAIL Left 08/16/2021   Procedure: INTRAMEDULLARY (IM) NAIL FEMORAL;  Surgeon: Deeann Saint, MD;  Location: [facility] ORS;  Service: Orthopedics;  Laterality: Left;   IR IVC FILTER PLMT / S&I /IMG GUID/MOD SED  12/08/2017   Patient Active Problem List   Diagnosis Date Noted   Age-related  osteoporosis without current pathological fracture 07/27/2022   Mild recurrent major depression (HCC) 07/27/2022   Hypertensive heart disease with other congestive heart failure (HCC) 02/14/2022   Chronic indwelling Foley catheter 02/14/2022   Closed hip fracture (HCC) 08/15/2021   DVT (deep venous thrombosis) (HCC) 08/15/2021   Quadriplegia (HCC) 08/15/2021   Fall 08/15/2021   Constipation due to slow transit 08/31/2018   Trauma 06/05/2018   Neuropathic pain 06/05/2018   Neurogenic bowel 06/05/2018   Vaginal yeast infection 01/30/2018   Healthcare-associated pneumonia 01/25/2018   Chronic allergic rhinitis 01/24/2018   Depression with anxiety 01/24/2018   UTI due to Klebsiella species 01/24/2018   Protein-calorie malnutrition, severe (HCC) 01/24/2018   S/P insertion of IVC (inferior vena caval) filter 01/24/2018   Tetraparesis (HCC) 01/20/2018   Neurogenic bladder 01/20/2018   Reactive depression    Benign essential HTN    Acute postoperative anemia due to expected blood loss    Central cord syndrome at C6 level of cervical spinal cord (HCC) 11/29/2017   Allergy to alpha-gal 11/25/2016   SCC (squamous cell carcinoma) 04/08/2014    ONSET DATE: 08/15/2021 (fall with B hip fx); Initial injury was in 2019- quadraplegia due to central cord syndrome of C6 secondary to neck fracture.   REFERRING DIAG: Bilateral Hip fx; Left Open- s/p Left IM nail femoral on 08/16/2021; Right closed  THERAPY DIAG:  Muscle weakness (generalized)  Other lack of coordination  Central cord syndrome, sequela (HCC)  Other abnormalities of gait and mobility  Difficulty in walking, not elsewhere classified  Closed fracture of both hips, initial encounter [facility])  Rationale for Evaluation and Treatment Rehabilitation  SUBJECTIVE:   SUBJECTIVE STATEMENT:   Patient reports doing well overall- no new concerns.        Pt accompanied by:  Paid caregiver-   PERTINENT HISTORY: Sherry [name] is an  87yoF who experienced a fall at home on 08/15/2021 with hip fracture, s/p Left hip ORIF. PMH: quadriplegia due to central cord syndrome of C6 secondary to neck fracture, HTN, depression with anxiety, neurogenic bladder, bilateral DVT on Xarelto (s/p of IVC filter placement). Prior history of significant PT/OT with improvement of function. Pt has full use of shoulder/elbows, limited use of hands, adaptive self feeding sucessful.     PAIN:  None current but does endorse some left thigh pain intermittent  OBJECTIVE:    TODAY'S TREATMENT:   04/20/2023    Therapeutic activities:   SPT- Max Assist of 1 with a second person (supervision) - transfer from w/c to mat table.   -Static sitting at edge of mat- x 3 min focusing on maintaining upright position -side bending- weight bearing onto forearms. X 20 reps each -Dynamic reaching (cross body UE reaching) to improve ant weight shift- x 20 reps -manual resistive hip add- hold 5 sec x 12 reps - swing leg laterally (moving both knee and foot)  x15 reps Ea -seated knee ext- 2 x 10 reps each LE - anterior weight shift- leaning forward x 20 reps  SPT- Max Assist of 1 with a second person (supervision) - transfer from mat table to power w/c. *Patient did endorse some left knee discomfort with transitioning but once seated stated pain went away.     PATIENT EDUCATION: Education details: Exercise technique, Discussion of how to obtain order/F2F for a new brace Patient verbalized understanding.   HOME EXERCISE PROGRAM: No changes at this time  Access Code: [facility] URL: [email or web address] Date: 11/23/2021 Prepared by: Precious Bard Exercises - Seated Gluteal Sets - 2 x daily - 7 x weekly - 2 sets - 10 reps - 5 hold - Seated Quad Set - 2 x daily - 7 x weekly - 2 sets - 10 reps - 5 hold - Seated Long Arc Quad - 2 x daily - 7 x weekly - 2 sets - 10 reps - 5 hold - Seated March - 2 x daily - 7 x weekly - 2 sets - 10 reps - 5 hold - Seated  Hip Abduction - 2 x daily - 7 x weekly - 2 sets - 10 reps - 5 hold - Seated Shoulder Shrugs - 2 x daily - 7 x weekly - 2 sets - 10 reps - 5 hold - Wheelchair Pressure Relief - 2 x daily - 7 x weekly - 2 sets - 10 reps - 5 hold  GOALS: Goals reviewed with patient? Yes  SHORT TERM GOALS: Target date: 02/22/2022  Pt will be independent with initial UE strengthening HEP in order to improve strength and balance in order to decrease fall risk and improve function at home and work. Baseline: 10/19/2021- patient with no formal UE HEP; 12/02/2021= Patient verbalized knowledge of HEP including use of theraband for UE strengthening.  Goal status: GOAL MET  LONG TERM GOALS: Target date: 06/20/2023  Pt will be independent with final for UE/LE HEP in order to improve strength and balance in  order to decrease fall risk and improve function at home and work. Baseline: Patient is currently BLE NWB and unable to participate in HEP. Has order for UE strengthening. 01/11/2022- Patient now able to participate in LE strengthening although NWB still- good understanding for some basic exercises. Will keep goal active to incorporate progressive LE strengthening exercises. 04/07/2022= Patient still participating in progressive LE seated HEP and has no questions at this time.  Goal status: MET  2.  Pt will improve FOTO to target score of 35  to display perceived improvements in ability to complete ADL's.  Baseline: 10/19/2021= 12; 12/02/2021= 17; 01/11/2022=17; 04/07/2022= 17; 07/05/2022=17; 10/06/2022- outcome measure not appropriate as patient in non-ambulatory and not consistently standing. Goal status: Goal not appropriate  3.  Pt will increase strength of B UE  by at least 1/2 MMT grade in order to demonstrate improvement in strength and function  Baseline: patient has range of 2-/5 to 4/5 BUE Strength; 12/02/2021= 4/5 except with wrist ext. 01/11/2022= 4/5 B UE strength expept for wrist Goal status: Goal revised-no longer  appropriate- working with OT on all UE strengthening.   4. Pt. Will increase strength of RLE by at least 1/2 MMT grade in order to demonstrate improvement in Standing/transfers.  Baseline: 2-/5 Right hip flex/knee ext/flex; 04/07/2022= 2- with right hip flex/knee ext (lacking 28 deg from zero) 4/8= Patient able to ext right knee lacking 18 deg from zero. 07/05/2022=Left knee approx 8 deg from zero and right knee = 23 deg from zero; 12/29/2022 - Left LE = full ROM 4-/5 and right knee ext= 23 deg from zero= 2+/5 (gravity minimized); 03/28/2023= Left knee ext=  4/5 and right knee ext= 3-/5 (able to move lower leg through incomplete ROM -lacking 20 deg from full ROM against gravity)  Goal status: Progressing  5. Pt. Will demo ability to stand pivot transfer with max assist for improved functional mobility and less dependent need on mechanical device.   Baseline: Dependent on hoyer lift for all transfers; 04/07/2022- Unable to test secondary to patient with recent UTI and right hand procedure - will attempt to reassess next visit. ; 04/12/22: Unable to successfully stand due to feet plates on loaned power w/c; 05/10/2022- Patient still having to use rental chair and has not received her original power w/c back to practice SPT. 07/04/2021= Patient just received new power w/c and attempted standing from 1st time today- Patient able to stand with max assist today from w/c and did not attempt to perform pivot transfer- difficulty with static standing and placing weight on right LE. 10/06/2022- Not assessed today- patient needs new AFO prior to attempting transfers; 12/29/2022= Patient has not attempted SPT yet - still trying to improve LE strength enough to stand well without knee buckling with weight shifting. 03/28/2023- not attempted today due to ill fitting brace but patient last performed SPT on 01/10/23 and was able to perform with Max A with feet pre-positioned to angle toward direction of transfer  Goal status: ONGOING  6.  Pt. Will demonstrate improved functional LE strength as seen by ability to stand > 2 min for improved transfer ability and pregait abilities.   Baseline: not assessed today due to recent dx: UTI; 04/07/2022- Unable to test secondary to patient with recent UTI and right hand procedure - will attempt to reassess next visit; 04/12/22: Unable to successfully stand due to feet plates on loaned power w/c 05/10/2022- Patient still having to use rental chair and has not received her original power  w/c back to practice Standing. 07/05/2022- Patient able to stand with max assist at trunk today for 2 min 3 sec. Will keep goal active to ensure consistency. 10/06/2022= Unable to assess today- patient with some recent blood pressure issues and needs new AFO. 11/24/2022= Patient demonstrated multiple rounds of standing in // bars- holding up to 1:45 min today but has been able to exhibit 2 min in recent past- working on consistency. 12/29/2022= 2 min 40 sec with patient demonstrating improved overall posture and ability to contract glutes and quads with just min assist at trunk today.   Goal status: MET  7. Pt. Will demonstrate improved functional LE strength as seen by ability to stand > 5 min for improved transfer ability and pregait abilities. Baseline: 12/29/2022= 2 min 40 sec with patient demonstrating improved overall posture and ability to contract glutes and quads with just min assist at trunk today. 03/28/2023= Attempted 2 trials today - at best 2 min 48 sec today but during cert- did stand for 3 min 44 sec.  Goal Status: Ongoing- Patient will likely receive new AFO in upcoming weeks and anticipate more standing time once received.  8. Patient will perform sit to stand transfer with moderate assist consistently > 75% of time for improved transfer ability and less dependence on caregiver.   Baseline: 12/29/2022- Patient currently max assist level assistance with all sit to stand activities. 03/28/2023= Patient is still  majority of max assist to stand yet able to pull up from wheelchair smoother in one motion.   Goal status: ONGOING   ASSESSMENT:  CLINICAL IMPRESSION:   Treatment focused on some dynamic activities sitting at [facility] today. Patient presented with good motivation for today's session. She was challenged with anterior weight shifting- yet able to perform with only VC. She experienced some pain transferring back from mat to chair (left knee) but stated feeling normal once repositioned.  Pt will continue to benefit from skilled PT services to optimize independence and reduced caregiver support.   Will focus more on transfers, Sit to stand and standing once patient receives AFO.   OBJECTIVE IMPAIRMENTS Abnormal gait, decreased activity tolerance, decreased balance, decreased coordination, decreased endurance, decreased mobility, difficulty walking, decreased ROM, decreased strength, hypomobility, impaired flexibility, impaired UE functional use, postural dysfunction, and pain.   ACTIVITY LIMITATIONS carrying, lifting, bending, sitting, standing, squatting, sleeping, stairs, transfers, bed mobility, continence, bathing, toileting, dressing, self feeding, reach over head, hygiene/grooming, and caring for others  PARTICIPATION LIMITATIONS: meal prep, cleaning, laundry, medication management, personal finances, interpersonal relationship, driving, shopping, community activity, and yard work  PERSONAL FACTORS Age, Time since onset of injury/illness/exacerbation, and 1-2 comorbidities: HTN, cervical Sx  are also affecting patient's functional outcome.   REHAB POTENTIAL: Good  CLINICAL DECISION MAKING: Evolving/moderate complexity  EVALUATION COMPLEXITY: Moderate  PLAN: PT FREQUENCY: 1-2x/week  PT DURATION: 12 weeks  PLANNED INTERVENTIONS: Therapeutic exercises, Therapeutic activity, Neuromuscular re-education, Balance training, Gait training, Patient/Family education, Self Care, Joint mobilization, DME  instructions, Dry Needling, Electrical stimulation, Wheelchair mobility training, Spinal mobilization, Cryotherapy, Moist heat, and Manual therapy  PLAN FOR NEXT SESSION:  LE and core strength training, Sit to stand and static standing; progress to SPT as able.    1:28 PM, 04/20/23 Louis Meckel, PT Physical Therapist - [facility]  Outpatient Physical Therapy- Main Campus [phone]

## 2023-04-25 ENCOUNTER — Ambulatory Visit: Payer: Medicare HMO

## 2023-04-25 ENCOUNTER — Ambulatory Visit: Payer: Medicare HMO | Admitting: Occupational Therapy

## 2023-04-25 DIAGNOSIS — M6281 Muscle weakness (generalized): Secondary | ICD-10-CM

## 2023-04-25 DIAGNOSIS — R2689 Other abnormalities of gait and mobility: Secondary | ICD-10-CM

## 2023-04-25 DIAGNOSIS — S14129S Central cord syndrome at unspecified level of cervical spinal cord, sequela: Secondary | ICD-10-CM

## 2023-04-25 DIAGNOSIS — R278 Other lack of coordination: Secondary | ICD-10-CM

## 2023-04-25 DIAGNOSIS — S72001A Fracture of unspecified part of neck of right femur, initial encounter for closed fracture: Secondary | ICD-10-CM

## 2023-04-25 DIAGNOSIS — R269 Unspecified abnormalities of gait and mobility: Secondary | ICD-10-CM

## 2023-04-25 DIAGNOSIS — R262 Difficulty in walking, not elsewhere classified: Secondary | ICD-10-CM

## 2023-04-25 NOTE — Therapy (Signed)
 OUTPATIENT PHYSICAL THERAPY NEURO TREATMENT    Patient Name: Sherry [name] MRN: [id number] DOB:10-04-1934, 89 y.o., female Today's Date: 04/25/2023   PCP: Sherry Mealing, MD REFERRING PROVIDER: Deeann [name]   PT End of Session - 04/25/23 1155     Visit Number 106    Number of Visits 124    Date for PT Re-Evaluation 06/20/23    Authorization Type [employer] FOTO performed by PT on eval (7/20), score 12, Progress note on 03/10/2020; PN on 08/20/2020    Authorization Time Period Initial cert 05/10/8117-14/78/2956; Recert 01/11/2022-04/05/2022; Recert 04/07/2022-06/30/2022    Progress Note Due on Visit 110    PT Start Time 1149    PT Stop Time 1228    PT Time Calculation (min) 39 min    Equipment Utilized During Treatment Gait belt    Activity Tolerance Patient tolerated treatment well    Behavior During Therapy WFL for tasks assessed/performed                     Past Medical History:  Diagnosis Date   Acute blood loss anemia    Arthritis    Cancer (HCC)    skin   Central cord syndrome at C6 level of cervical spinal cord (HCC) 11/29/2017   Hypertension    Protein-calorie malnutrition, severe (HCC) 01/24/2018   S/P insertion of IVC (inferior vena caval) filter 01/24/2018   Tetraparesis (HCC)    Past Surgical History:  Procedure Laterality Date   ANTERIOR CERVICAL DECOMP/DISCECTOMY FUSION N/A 11/29/2017   Procedure: Cervical five-six, six-seven Anterior Cervical Decompression Fusion;  Surgeon: Sherry Pierini, MD;  Location: [facility] OR;  Service: Neurosurgery;  Laterality: N/A;  Cervical five-six, six-seven Anterior Cervical Decompression Fusion   CATARACT EXTRACTION     FEMUR IM NAIL Left 08/16/2021   Procedure: INTRAMEDULLARY (IM) NAIL FEMORAL;  Surgeon: Sherry Saint, MD;  Location: [facility] ORS;  Service: Orthopedics;  Laterality: Left;   IR IVC FILTER PLMT / S&I /IMG GUID/MOD SED  12/08/2017   Patient Active Problem List   Diagnosis Date Noted   Age-related  osteoporosis without current pathological fracture 07/27/2022   Mild recurrent major depression (HCC) 07/27/2022   Hypertensive heart disease with other congestive heart failure (HCC) 02/14/2022   Chronic indwelling Foley catheter 02/14/2022   Closed hip fracture (HCC) 08/15/2021   DVT (deep venous thrombosis) (HCC) 08/15/2021   Quadriplegia (HCC) 08/15/2021   Fall 08/15/2021   Constipation due to slow transit 08/31/2018   Trauma 06/05/2018   Neuropathic pain 06/05/2018   Neurogenic bowel 06/05/2018   Vaginal yeast infection 01/30/2018   Healthcare-associated pneumonia 01/25/2018   Chronic allergic rhinitis 01/24/2018   Depression with anxiety 01/24/2018   UTI due to Klebsiella species 01/24/2018   Protein-calorie malnutrition, severe (HCC) 01/24/2018   S/P insertion of IVC (inferior vena caval) filter 01/24/2018   Tetraparesis (HCC) 01/20/2018   Neurogenic bladder 01/20/2018   Reactive depression    Benign essential HTN    Acute postoperative anemia due to expected blood loss    Central cord syndrome at C6 level of cervical spinal cord (HCC) 11/29/2017   Allergy to alpha-gal 11/25/2016   SCC (squamous cell carcinoma) 04/08/2014    ONSET DATE: 08/15/2021 (fall with B hip fx); Initial injury was in 2019- quadraplegia due to central cord syndrome of C6 secondary to neck fracture.   REFERRING DIAG: Bilateral Hip fx; Left Open- s/p Left IM nail femoral on 08/16/2021; Right closed  THERAPY DIAG:  Muscle weakness (generalized)  Other lack of coordination  Central cord syndrome, sequela (HCC)  Other abnormalities of gait and mobility  Difficulty in walking, not elsewhere classified  Closed fracture of both hips, initial encounter (HCC)  Abnormality of gait and mobility  Rationale for Evaluation and Treatment Rehabilitation  SUBJECTIVE:   SUBJECTIVE STATEMENT:   Patient reports having a good weekend. Having some toe pressure/pain with brace- alle.        Pt accompanied  by:  Sherry [name]-   PERTINENT HISTORY: Sherry [name] is an 89yoF who experienced a fall at home on 08/15/2021 with hip fracture, s/p Left hip ORIF. PMH: quadriplegia due to central cord syndrome of C6 secondary to neck fracture, HTN, depression with anxiety, neurogenic bladder, bilateral DVT on Xarelto (s/p of IVC filter placement). Prior history of significant PT/OT with improvement of function. Pt has full use of shoulder/elbows, limited use of hands, adaptive self feeding sucessful.     PAIN:  None current but does endorse some left thigh pain intermittent  OBJECTIVE:    TODAY'S TREATMENT:   04/20/2023    Therapeutic activities:   -side bending- fingers reaching toward floor x 10 reps each -Dynamic reaching (cross body UE reaching) to improve ant and lateral weight shift- x 20 reps -manual resistive lumbar extensiond- 2x 12 reps   Therex LAQ active 2 x 12 reps  Seated hip abd 2 x 12 reps each LE Manual resistive hip abd 2x 10 rep with 3 sec hold Manual Leg press 2 x 12 reps each LE    PATIENT EDUCATION: Education details: Exercise technique, Discussion of how to obtain order/F2F for a new brace Patient verbalized understanding.   HOME EXERCISE PROGRAM: No changes at this time  Access Code: [facility] URL: [name] Date: 11/23/2021 Prepared by: Sherry [name] Exercises - Seated Gluteal Sets - 2 x daily - 7 x weekly - 2 sets - 10 reps - 5 hold - Seated Quad Set - 2 x daily - 7 x weekly - 2 sets - 10 reps - 5 hold - Seated Long Arc Quad - 2 x daily - 7 x weekly - 2 sets - 10 reps - 5 hold - Seated March - 2 x daily - 7 x weekly - 2 sets - 10 reps - 5 hold - Seated Hip Abduction - 2 x daily - 7 x weekly - 2 sets - 10 reps - 5 hold - Seated Shoulder Shrugs - 2 x daily - 7 x weekly - 2 sets - 10 reps - 5 hold - Wheelchair Pressure Relief - 2 x daily - 7 x weekly - 2 sets - 10 reps - 5 hold  GOALS: Goals reviewed with patient? Yes  SHORT TERM GOALS:  Target date: 02/22/2022  Pt will be independent with initial UE strengthening HEP in order to improve strength and balance in order to decrease fall risk and improve function at home and work. Baseline: 10/19/2021- patient with no formal UE HEP; 12/02/2021= Patient verbalized knowledge of HEP including use of theraband for UE strengthening.  Goal status: GOAL MET  LONG TERM GOALS: Target date: 06/20/2023  Pt will be independent with final for UE/LE HEP in order to improve strength and balance in order to decrease fall risk and improve function at home and work. Baseline: Patient is currently BLE NWB and unable to participate in HEP. Has order for UE strengthening. 01/11/2022- Patient now able to participate in LE strengthening although NWB still- good understanding for some basic exercises. Will keep goal active  to incorporate progressive LE strengthening exercises. 04/07/2022= Patient still participating in progressive LE seated HEP and has no questions at this time.  Goal status: MET  2.  Pt will improve FOTO to target score of 35  to display perceived improvements in ability to complete ADL's.  Baseline: 10/19/2021= 12; 12/02/2021= 17; 01/11/2022=17; 04/07/2022= 17; 07/05/2022=17; 10/06/2022- outcome measure not appropriate as patient in non-ambulatory and not consistently standing. Goal status: Goal not appropriate  3.  Pt will increase strength of B UE  by at least 1/2 MMT grade in order to demonstrate improvement in strength and function  Baseline: patient has range of 2-/5 to 4/5 BUE Strength; 12/02/2021= 4/5 except with wrist ext. 01/11/2022= 4/5 B UE strength expept for wrist Goal status: Goal revised-no longer appropriate- working with OT on all UE strengthening.   4. Pt. Will increase strength of RLE by at least 1/2 MMT grade in order to demonstrate improvement in Standing/transfers.  Baseline: 2-/5 Right hip flex/knee ext/flex; 04/07/2022= 2- with right hip flex/knee ext (lacking 28 deg from zero)  4/8= Patient able to ext right knee lacking 18 deg from zero. 07/05/2022=Left knee approx 8 deg from zero and right knee = 23 deg from zero; 12/29/2022 - Left LE = full ROM 4-/5 and right knee ext= 23 deg from zero= 2+/5 (gravity minimized); 03/28/2023= Left knee ext=  4/5 and right knee ext= 3-/5 (able to move lower leg through incomplete ROM -lacking 20 deg from full ROM against gravity)  Goal status: Progressing  5. Pt. Will demo ability to stand pivot transfer with max assist for improved functional mobility and less dependent need on mechanical device.   Baseline: Dependent on hoyer lift for all transfers; 04/07/2022- Unable to test secondary to patient with recent UTI and right hand procedure - will attempt to reassess next visit. ; 04/12/22: Unable to successfully stand due to feet plates on loaned power w/c; 05/10/2022- Patient still having to use rental chair and has not received her original power w/c back to practice SPT. 07/04/2021= Patient just received new power w/c and attempted standing from 1st time today- Patient able to stand with max assist today from w/c and did not attempt to perform pivot transfer- difficulty with static standing and placing weight on right LE. 10/06/2022- Not assessed today- patient needs new AFO prior to attempting transfers; 12/29/2022= Patient has not attempted SPT yet - still trying to improve LE strength enough to stand well without knee buckling with weight shifting. 03/28/2023- not attempted today due to ill fitting brace but patient last performed SPT on 01/10/23 and was able to perform with Max A with feet pre-positioned to angle toward direction of transfer  Goal status: ONGOING  6. Pt. Will demonstrate improved functional LE strength as seen by ability to stand > 2 min for improved transfer ability and pregait abilities.   Baseline: not assessed today due to recent dx: UTI; 04/07/2022- Unable to test secondary to patient with recent UTI and right hand procedure - will  attempt to reassess next visit; 04/12/22: Unable to successfully stand due to feet plates on loaned power w/c 05/10/2022- Patient still having to use rental chair and has not received her original power w/c back to practice Standing. 07/05/2022- Patient able to stand with max assist at trunk today for 2 min 3 sec. Will keep goal active to ensure consistency. 10/06/2022= Unable to assess today- patient with some recent blood pressure issues and needs new AFO. 11/24/2022= Patient demonstrated multiple rounds of standing  in // bars- holding up to 1:45 min today but has been able to exhibit 2 min in recent past- working on consistency. 12/29/2022= 2 min 40 sec with patient demonstrating improved overall posture and ability to contract glutes and quads with just min assist at trunk today.   Goal status: MET  7. Pt. Will demonstrate improved functional LE strength as seen by ability to stand > 5 min for improved transfer ability and pregait abilities. Baseline: 12/29/2022= 2 min 40 sec with patient demonstrating improved overall posture and ability to contract glutes and quads with just min assist at trunk today. 03/28/2023= Attempted 2 trials today - at best 2 min 48 sec today but during cert- did stand for 3 min 44 sec.  Goal Status: Ongoing- Patient will likely receive new AFO in upcoming weeks and anticipate more standing time once received.  8. Patient will perform sit to stand transfer with moderate assist consistently > 75% of time for improved transfer ability and less dependence on [name].   Baseline: 12/29/2022- Patient currently max assist level assistance with all sit to stand activities. 03/28/2023= Patient is still majority of max assist to stand yet able to pull up from wheelchair smoother in one motion.   Goal status: ONGOING   ASSESSMENT:  CLINICAL IMPRESSION:   Treatment consisted of dynamic trunk movement and LE strengthening today. She performed well overall - still minima Hip abd strength but  did demo improve functional reaching with no LOB today. Pt will continue to benefit from skilled PT services to optimize independence and reduced [name] support.   Will focus more on transfers, Sit to stand and standing once patient receives AFO.   OBJECTIVE IMPAIRMENTS Abnormal gait, decreased activity tolerance, decreased balance, decreased coordination, decreased endurance, decreased mobility, difficulty walking, decreased ROM, decreased strength, hypomobility, impaired flexibility, impaired UE functional use, postural dysfunction, and pain.   ACTIVITY LIMITATIONS carrying, lifting, bending, sitting, standing, squatting, sleeping, stairs, transfers, bed mobility, continence, bathing, toileting, dressing, self feeding, reach over head, hygiene/grooming, and caring for others  PARTICIPATION LIMITATIONS: meal prep, cleaning, laundry, medication management, personal finances, interpersonal relationship, driving, shopping, community activity, and yard work  PERSONAL FACTORS Age, Time since onset of injury/illness/exacerbation, and 1-2 comorbidities: HTN, cervical Sx  are also affecting patient's functional outcome.   REHAB POTENTIAL: Good  CLINICAL DECISION MAKING: Evolving/moderate complexity  EVALUATION COMPLEXITY: Moderate  PLAN: PT FREQUENCY: 1-2x/week  PT DURATION: 12 weeks  PLANNED INTERVENTIONS: Therapeutic exercises, Therapeutic activity, Neuromuscular re-education, Balance training, Gait training, Patient/Family education, Self Care, Joint mobilization, DME instructions, Dry Needling, Electrical stimulation, Wheelchair mobility training, Spinal mobilization, Cryotherapy, Moist heat, and Manual therapy  PLAN FOR NEXT SESSION:  LE and core strength training, Sit to stand and static standing; progress to SPT as able.    3:26 PM, 04/25/23 Louis Meckel, PT Physical Therapist - [facility]  Outpatient Physical Therapy- [facility] [phone]

## 2023-04-25 NOTE — Therapy (Addendum)
 Occupational Therapy Neuro Treatment Note          Patient Name: Sherry [name] MRN: [id number] DOB:[birth year], 89 y.o., female Today's Date: 11/29/2022  PCP:  Cindi Carbon PROVIDER: Earnestine Mealing, MD  END OF SESSION:   OT End of Session - 04/25/23 1653     Visit Number 115    Number of Visits 192    Date for OT Re-Evaluation 06/27/23    OT Start Time 1100    OT Stop Time 1143    OT Time Calculation (min) 43 min    Activity Tolerance Patient tolerated treatment well    Behavior During Therapy [facility] for tasks assessed/performed                     Past Medical History:  Diagnosis Date   Acute blood loss anemia    Arthritis    Cancer (HCC)    skin   Central cord syndrome at C6 level of cervical spinal cord (HCC) 11/29/2017   Hypertension    Protein-calorie malnutrition, severe (HCC) 01/24/2018   S/P insertion of IVC (inferior vena caval) filter 01/24/2018   Tetraparesis (HCC)    Past Surgical History:  Procedure Laterality Date   ANTERIOR CERVICAL DECOMP/DISCECTOMY FUSION N/A 11/29/2017   Procedure: Cervical five-six, six-seven Anterior Cervical Decompression Fusion;  Surgeon: Jadene Pierini, MD;  Location: [facility] OR;  Service: Neurosurgery;  Laterality: N/A;  Cervical five-six, six-seven Anterior Cervical Decompression Fusion   CATARACT EXTRACTION     FEMUR IM NAIL Left 08/16/2021   Procedure: INTRAMEDULLARY (IM) NAIL FEMORAL;  Surgeon: Deeann Saint, MD;  Location: [facility] ORS;  Service: Orthopedics;  Laterality: Left;   IR IVC FILTER PLMT / S&I /IMG GUID/MOD SED  12/08/2017   Patient Active Problem List   Diagnosis Date Noted   Age-related osteoporosis without current pathological fracture 07/27/2022   Mild recurrent major depression (HCC) 07/27/2022   Hypertensive heart disease with other congestive heart failure (HCC) 02/14/2022   Chronic indwelling Foley catheter 02/14/2022   Closed hip fracture (HCC) 08/15/2021   DVT (deep venous thrombosis) (HCC)  08/15/2021   Quadriplegia (HCC) 08/15/2021   Fall 08/15/2021   Constipation due to slow transit 08/31/2018   Trauma 06/05/2018   Neuropathic pain 06/05/2018   Neurogenic bowel 06/05/2018   Vaginal yeast infection 01/30/2018   Healthcare-associated pneumonia 01/25/2018   Chronic allergic rhinitis 01/24/2018   Depression with anxiety 01/24/2018   UTI due to Klebsiella species 01/24/2018   Protein-calorie malnutrition, severe (HCC) 01/24/2018   S/P insertion of IVC (inferior vena caval) filter 01/24/2018   Tetraparesis (HCC) 01/20/2018   Neurogenic bladder 01/20/2018   Reactive depression    Benign essential HTN    Acute postoperative anemia due to expected blood loss    Central cord syndrome at C6 level of cervical spinal cord (HCC) 11/29/2017   Allergy to alpha-gal 11/25/2016   SCC (squamous cell carcinoma) 04/08/2014   ONSET DATE: 11/29/2017  REFERRING DIAG: Central Cord Syndrome at C6 of the Cervical Spinal Cord, Fall with Bilateral Closed Hip Fractures, with ORIF repair with Intramedullary Nailing of the Left Hip Fracture.   THERAPY DIAG:  Muscle weakness (generalized)  Other lack of coordination  Rationale for Evaluation and Treatment: Rehabilitation  SUBJECTIVE:    SUBJECTIVE STATEMENT:  Pt. Reports doing well today Pt accompanied by: self, Personal Care aide  PERTINENT HISTORY: Central Cord Syndrome at C6 of the Cervical Spinal Cord, Fall with Bilateral Closed Hip Fractures, with ORIF repair with Intramedullary Nailing  of the Left Hip Fracture.   PRECAUTIONS: None  WEIGHT BEARING RESTRICTIONS: No  PAIN:  Are you having pain? No pain, just shoulder stiffness   LIVING ENVIRONMENT: Lives with: [facility]  Stairs: No Has following equipment at home: Wheelchair (power)  PLOF: Independent   PATIENT GOALS:  See below for established goals  OBJECTIVE:  Note: Objective measures were completed at Evaluation unless otherwise noted.  HAND  DOMINANCE: Right  ADLs: Caregiver assist with ADLs, and [facility]  MOBILITY STATUS: Uses a power w/c  ACTIVITY TOLERANCE: Activity tolerance: Fair  FUNCTIONAL OUTCOME MEASURES:  Measurements:   12/09/2021:   Shoulder flexion: R: 120(136), L: 138(150) Shoulder abduction: R: 108(120), L: 110(120) Elbow: R: 0-148, L: 0-146 Wrist flexion: R: 55(62), L: 78 Writ extension: R: 34(55), L: 3(4) RD: R: 12(20), L: 16(26) UD: R: 8(24), L: 6(18) Thumb radial abduction: R: 11(20), L: 8(20) Digit flexion to the [facility]: R:  2nd: 5cm(3.5cm), 3rd: 7.5 cm(5cm), 4th: 7cm(3cm), 5th: 6cm(5.5cm) L: 2nd: 8cm(8cm), 3rd: 8cm(8cm), 4th: 7.5cm(7cm), 5th: 7cm(7cm)   02/03/2022:   Shoulder flexion: R: 122(138), L: 148(154) Shoulder abduction: R: 109(125), L: 125(125 Elbow: R: 0-148, L: 0-146 Wrist flexion: R: 60(68), L: 79 Writ extension: R: 40(55), L: 3(10) RD: R: 14(24), L: 20(28) UD: R: 8(24), L: 6(18) Thumb radial abduction: R: 20(25), L: 8(20) Digit flexion to the [facility]: R:  2nd: 4.5cm(3cm), 3rd: 6.5cm(4.5cm), 4th: 6.5 cm(3cm), 5th: 4.5cm(5cm) L: 2nd: 8cm(8cm), 3rd: 8cm(7cm), 4th: 7.5cm(7cm), 5th: 6.5cm(6.5cm)   04/07/2022:   Shoulder flexion: R: 125(150), L: 160(160) Shoulder abduction: R: 109(125), L: 125(125 Elbow: R: 0-148, L: 0-146 Wrist flexion: R: 60(68), L: 79 Writ extension: R: 40(55), L: 5(18) RD: R: 14(24), L: 20(28) UD: R: 18(24), L: 8(18) Thumb radial abduction: R: 20(25), L: 8(20) Digit flexion to the [facility]: R:  2nd: 4.5 cm(4 cm), 3rd: 6.5cm(3cm), 4th: 7 cm (5cm), 5th: 5cm(5cm) L: 2nd: 8cm(8cm), 3rd: 8cm(7cm), 4th: 7.5cm(7cm), 5th: 7cm(7cm)   07/05/2022:   Shoulder flexion: R: 125(154), L: 160(160) Shoulder abduction: R: 95(130), L: 143(143) Elbow: R: 0-148, L: 0-146 Wrist flexion: R: 60(68), L: 79 Writ extension: R: 40(60), L: 0(12) RD: R: 10(24), L: 12(20) UD: R: 16(24), L: 8(16) Thumb radial abduction: R: 20(25), L: 8(20) Digit flexion to the [facility]: R:  2nd: 4  cm(4cm), 3rd: 6.5cm( 3.5cm), 4th: 7 cm (5cm), 5th: 6cm(5cm) L: 2nd: 8cm(8cm), 3rd: 8cm(7cm), 4th: 8cm(7cm), 5th: 7cm(7cm)   09/13/2022:   Shoulder flexion: R: 125(154), L: 160(160) Shoulder abduction: R: 100(130), L: 143(143) Elbow: R: 0-150, L: 0-150 Wrist flexion: R: 55(68), L: 79 Writ extension: R: 45(60), L: 0(12) RD: R: 10(24), L: 12(20) UD: R: 16(24), L: 8(16) Thumb radial abduction: R: 20(25), L: 8(20) Digit flexion to the [facility]: R:  2nd: 4 cm(4cm), 3rd: 6cm( 5cm), 4th: 6 cm (4cm), 5th: 5cm(5cm) L: 2nd: 7cm(cm), 3rd: 7cm(6cm), 4th: 7cm(6cm), 5th: 6cm(6cm)   10/27/2022   Shoulder flexion: R: 128(154), L: 160(160) Shoulder abduction: R: 105(130), L: 143(143) Elbow: R: 0-150, L: 0-150 Wrist flexion: R: 55(68), L: 79 Writ extension: R: 46(64), L: 0(20) RD: R: 10(20), L: 18(24) UD: R: 18(28), L: 8(20) Thumb radial abduction: R: 30(42), L:20(24) Digit flexion to the [facility]: R:  2nd: 4 cm(3cm), 3rd: 6cm( 6cm), 4th: 6 cm (5cm), 5th: 5cm(5cm) L: 2nd: 8cm(8cm), 3rd: 8cm(6cm), 4th: 8cm(7cm), 5th: 6cm(6cm)  01/19/2023  Shoulder flexion: R: 131(154), L: 160(160) Shoulder abduction: R: 110(130), L: 143(143) Elbow: R: 0-150, L: 0-150 Wrist flexion: R: 55(68), L:  79 Writ extension: R: 46(64), L: 0(20) RD: R: 10(20), L: 18(24) UD: R: 18(28), L: 14(20) Thumb radial abduction: R: 38(48), L:24(26) Thumb IP flexion: R: 35(50) L: 20(45) Digit flexion to the [facility]: R:  2nd: 3.5 cm(3cm), 3rd: 6cm( 6cm), 4th: 6 cm (5cm), 5th: 5cm(4.5cm) L: 2nd: 8cm(7.5cm), 3rd: 8cm(6cm), 4th: 7.5cm(6cm), 5th: 6cm(4.5cm)  04/04/2023  Shoulder flexion: R: 134(154), L: 160(160) Shoulder abduction: R: 114(130), L: 143(143) Elbow: R: WNL, L: WNL Wrist flexion: R: 55(68), L: 80 Writ extension: R: 46(64), L: -10(10) RD: R: 14(24), L: 20(24) UD: R: 20(30), L: 14(20) Thumb radial abduction: R: 38(48), L:28(30) Thumb IP flexion: R: 40(50) L: 25(45) Digit flexion to the [facility]: R:  2nd: 3.5 cm(3cm), 3rd: 6cm( 5cm),  4th: 6 cm (4cm), 5th: 5cm(5cm) L: 2nd: 8cm(7.5cm), 3rd: 8cm(7cm), 4th: 7.5cm(7cm), 5th: 7cm(6cm)   COORDINATION:   01/19/2023:  9 hole peg test:  Right: 2 pegs placed in 1 min. & 5 sec.  Pt. was able to remove 9 pegs in 17 sec.  Left: 2 pegs placed in 1 min. & 10 sec. Pt. was able to remove 9 pegs in 20 sec.  04/04/23:  Pt. was able to consistently grasp the pegs however each one slipped out of her fingers when attempting to place them into the Pegboard.  SENSATION: Intact  EDEMA: N/A   COGNITION: Overall cognitive status: WNL   VISION: Subjective report: Wears glasses, No changes in vision.  PERCEPTION: Intact  TODAY'S TREATMENT:                                                                                                                              DATE:  04/25/23   Manual Therapy:   Pt. tolerated soft tissue massage to the volar, and dorsal surface of each digit on the bilateral hands, digit lateral, and sagittal bands 2/2 to stiffness following moist heat modality. Soft tissue mobilization was performed for carpal, and metacarpal spread stretches. Manual therapy was performed independent of, and in preparation for therapeutic Ex.     Therapeutic Ex.    -Pt. tolerated PROM followed by AROM bilateral wrist extension, PROM for bilateral digit MP, PIP, and DIP flexion, and extension, thumb abduction.  -Performed BUE strengthening using a 2# dowel ex. 2/2 to weakness. Bilateral shoulder flexion, chest press, circular patterns, and elbow flexion/extension for 1 set  10 reps each.   Neuromuscular re-education:    -Facilitated right hand [facility] skills using a lateral grasp to remove 1" resistive cubes from a velcro resistive board.  -Encouraged isolated 2nd digit extension to press them into place. -The vertical board was placed at a vertical angle to encourage wrist extension.      PATIENT EDUCATION:  Education details: Bilateral hand function, ROM, and strengthening,  reaching, and [facility]  Person educated: Patient Education method: Explanation, Demonstration, and Verbal cues Education comprehension: verbalized understanding and returned demonstration  HOME EXERCISE PROGRAM: Continue to assess HEP needs, and provide as  needed.   GOALS: Goals reviewed with patient? Yes  LONG TERM GOALS: Target date: 04/13/2023  3.  Patient will demonstrate improved composite finger flexion to be able to firmly hold and use adaptive devices during ADLs, and IADLs.  Baseline: 04/04/2023: Pt. continues to work on improving Digit flexion to the [facility] to be able to efficiently handle Adaptive equipment. 01/19/2023: Pt. Is improving with Bilateral digit flexion to the [facility]. Pt. has difficulty firmly holding adaptive devices. 12/06/2022: Pt. continues to present with increased MP, PIP, and DIP digit tightness/stiffness limiting the formulation of bilateral composites fists. 10/27/2022: Pt. presents with increased MP, PIP, and DIP digit tightness/stiffness limiting the formulation of bilateral composites fists during this progress reporting period limiting. Digit flexion to the [facility]: R:  2nd: 4 cm(3cm), 3rd: 6cm( 6cm), 4th: 6 cm (5cm), 5th: 5cm(5cm), L: 2nd: 8cm(8cm), 3rd: 8cm(6cm), 4th: 8cm(7cm), 5th: 6cm(6cm) 09/20/2022: Improving digit flexion to the [facility]. 09/13/22: improved digit flexion to the [facility]. 07/05/2022: Pt. presents with digit MP, PIP, and DIP extensor tightness limiting her ability to securely grip objects in her bilateral hands.  04/07/2022: Pt. Has improved with right 2nd, and 3rd digit flexion towards the [facility]. Pt. Continues to have difficulty securely holding and applying deodorant.02/03/2022: Pt. Has improved with digit flexion, however, continues to have difficulty securely holding and using deodorant. 12/09/2021: Bilateral hand/digit MP, PIP, and DIP extension tightness limits her ability achieve digit flexion to hold, and apply deodorant. 10/28/2021:  Pt. continues to have difficulty  holding the deodorant. 01/12/2021: Pt. Presents with limited digit extension. Pt. Is able to initiate holding deodorant, however is unable to hold it while using it  Goal status: Ongoing   4.  Pt. Will improve bilateral wrist extension in preparation for anticipating, and initiating reaching for objects at the table.  Baseline: 04/04/23: R: 46(64) L: -10(10)01/19/2023:  R: 46(64) L: 0(20) 12/06/2022: Pt continues to to progress with bilateral wrist extension in preparation fro functional reaching. 10/27/2022: Pt. Is improving with bilateral wrist extension. R: 46(64) L: 0(20) 09/20/2022: limited PROM in the Left wrist extension 2/2 tightness/stiffness. 09/13/22: R: 55(68), L: 0(12) 07/19/2022: Bilateral wrist extension in limited. 07/05/2022: Right: 40(60) Left: 0(12) 04/07/2022: Right: 40(55) Left: 5(18) 02/03/2022: Right: 34(55) Left: 3(4) 12/09/2021: Right  34(55)  Left  3(4) 10/28/2021:  Right 22(38), Left 0(15) 09/14/2021:  Right: 17(35), left 2(15)  Goal status: Left wrist tightness noted. Ongoing  7.  Pt. Will increase bilateral lateral pinch strength by 2 lbs to be able to securely grasp items during ADLs, and IADL tasks.  Baseline: 01/19/2023: Deferred as the Pinch meter is out for calibration.12/06/2022: Pt. Is able to more securely hold objects during ADLs/IADLs 10/27/2022: Pt. Is improving with holding items with her bilateral thumbs. (Pinch meter out for calibration) 09/20/2022: lateral pinch continues to be limited 09/13/22: R 6.5# L 4# 07/19/2022: TBD 07/05/2022: TBD 07/2022: NT-Pinch meter out for calibration. 02/03/2022: Right: 6#, Left: 4# 12/09/2021: Right: 6#, Left: 4#  Goal status: Deferred   9.  Pt. will complete plant care with modified independence.  Baseline:12/06/2022: Pt. Is independent with plant care in her husband's room, however is not able to access her plants due to furniture blocking access to them. 10/27/2022: Pt. Is able to water plants that are closer, and within reach. Pt. continues  to have difficulty reaching for thorough plant care. 09/20/2022: Pt. Continues to have difficulty reaching for thorough plant care. 09/13/22: continues to report intermittent difficulty 07/19/2022: Pt. Continues to be able to water, and  care for some of her plants. Pt. Has more difficulty with plants that are harder to reach. 07/05/2022: pt. Is able to water, and care for some of her plants. Pt. Has more difficulty with plants that are harder to reach. 04/07/2022: Pt. is now able to hold a cup and water her plants.02/03/2022: Pt. has difficulty caring for her plants.  Goal status:Achieved   10.  Pt. will demonstrate adaptive techniques to assist with the efficiency of self-dressing, or morning care tasks.  Baseline: 04/04/2023:  Pt. continues to work on improving UE functioning to be able to efficiently use Adaptive equipment. 01/19/2203: Pt. Is able to donn her jacket in reverse independently. Pt. requires assist from staff, as Pt. Reports staff have decreased time. 12/06/2022: Pt. requires assist from staff, as Pt. Reports staff have decreased time. 10/27/2022: Pt. is able to assist with initiating UE dressing. Pt. requires assist from personal, and staff care aides. 09/20/2022: Pt. continues to require assist form personal, and staff care aides. 09/13/22: MODA dressing, reports she is often rushed 07/19/2022: Pt. continues to require assist with self-dressing/morning care tasks. 07/05/2022: Continue with goal. 04/07/2022: Pt. Continues to require assist with the efficiency of self-dressing, and morning care tasks.02/03/2022: Pt. requires assist from caregivers 2/2 time limitations during morning care.  Goal status: Ongoing  11.  Pt. will improve BUE strength to improve ADL, and IADL functioning.  Baseline: 04/04/2023: shoulder flexion L 4/5, R 3+/5; shoulder abduction L 4+/5, R 3+/5. elbow flexion B 5/5, elbow extension 4/5 01/19/2023: L 4/5, R 3+/5 12/06/2022: Continue 10/27/2022: shoulder flexion L 4/5, R 3+/5;  shoulder abduction L 4+/5, R 3+/5. elbow flexion B 5/5, elbow extension 4/5 09/20/2022: Pt. Presents with limited BUE strength 09/13/22: shoulder flexion L 4/5, R 3/5; elbow flexion B 5/5, elbow extension 4-/5; shoulder abduction L 4+/5, R 3+/5. 07/19/2022: BUE strength continues to be limited. 07/05/2022: shoulder flexion: right 4-/5, abduction: 3+/5, elbow flexion: right: 5/5, left 5/5, extension: right: 4-/5, left 4-/5, wrist extension: right: 3-/5, left: 2-/5  Goal status: Ongoing  12.  Pt. will improve bilateral thumb radial abduction in order to be able to hold the grab bars while standing with PT  Baseline: 04/04/23: R: 38(48), Z:61(09) 01/19/2023: R: 38(48) L: 24(26)12/06/2022: Pt. Continues to present  with limited thumb abduction, however is improving holding onto the parallel bars.10/27/2022: thumb radial abduction: R: 30(42), L: 20(24  Goal status: Ongoing  13.  Pt. Will be able to securely hold, and stabilize medication bottles at a tabletop surface when opening, and closing them.  Baseline: 01/19/2023: Pt. Is now able to securely hold medication bottles while opening them, and stabilizes them on surfaces while opening them. 12/06/2022: Pt is improving with securely stabilizing medication bottles. 10/27/2022: Pt. Stabilizes bottles against her torso when attempting to open, and close them   Goal status: Achieved  14: Pt. Will improve bilateral thumb IP flexion to improve active grasping patterns.    Baseline: 04/04/2023:  R: 40(50) L: 25(45) 01/19/2023: Right 35(50), Left: 20/45)    Goal status: Ongoing    15. Pt. Will improve bilateral FMC/speed, and dexterity. As evidence by improved scores on the 9 hole peg test.  Baseline: 04/04/23: Pt. was able to consistently grasp the pegs however each one slipped out of her fingers when attempting to place them into the Pegboard. 01/19/2023: 2 pegs placed in 1 min. & 5 sec.  Pt. was able to remove 9 pegs in 17 sec.; Left: 2 pegs placed in 1 min. & 10  sec.  Pt. was able to remove 9 pegs in 20 sec.   Goal status: Ongoing   ASSESSMENT:  CLINICAL IMPRESSION:  Pt. tolerated UE manual therapy, FMC skills, and strengthening. Pt. tolerated dowel resistance weight increased to 2.5#. Pt. requires cues for visual demonstration of proper technique. Pt. Continues to present with digit MP, PIP, and DIP extensor tightness impacting her ability to formulate a full composite fist limiting gross grasping. Pt. continues to benefit from OT services to work on impoving ROM, and UE strength in order to work towards increasing bilateral hand grasp on objects, and increasing engagement of bilateral hands during ADLs, and IADL tasks.     PERFORMANCE DE PERFORMANCE DEFICITS: in functional skills including ADLs, IADLs, coordination, dexterity, ROM, strength, and UE functional use, cognitive skills including , and psychosocial skills including coping strategies and environmental adaptation.   IMPAIRMENTS: are limiting patient from ADLs, IADLs, and leisure.   CO-MORBIDITIES: may have co-morbidities  that affects occupational performance. Patient will benefit from skilled OT to address above impairments and improve overall function.  MODIFICATION OR ASSISTANCE TO COMPLETE EVALUATION: Min-Moderate modification of tasks or assist with assess necessary to complete an evaluation.  OT OCCUPATIONAL PROFILE AND HISTORY: Detailed assessment: Review of records and additional review of physical, cognitive, psychosocial history related to current functional performance.  CLINICAL DECISION MAKING: Moderate - several treatment options, min-mod task modification necessary  REHAB POTENTIAL: Good for stated goals  PLAN:  OT FREQUENCY 2x's a week  OT DURATION: 12 weeks  PLANNED INTERVENTIONS ADL training, A/E training, UE ther. Ex, Manual therapy, neuromuscular re-education, moist heat modality, Paraffin Bath, Splinting, and  Pt./caregiver education   RECOMMENDED OTHER SERVICES:  PT  CONSULTED AND AGREED WITH PLAN OF CARE: Patient  PLAN FOR NEXT SESSION: Continue Treatment as per established POC  Olegario Messier, MS, OTR/L  04/25/23, 4:55 PM

## 2023-04-27 ENCOUNTER — Ambulatory Visit: Payer: Medicare HMO

## 2023-04-27 ENCOUNTER — Ambulatory Visit: Payer: Medicare HMO | Admitting: Occupational Therapy

## 2023-04-27 DIAGNOSIS — S72001A Fracture of unspecified part of neck of right femur, initial encounter for closed fracture: Secondary | ICD-10-CM

## 2023-04-27 DIAGNOSIS — R269 Unspecified abnormalities of gait and mobility: Secondary | ICD-10-CM

## 2023-04-27 DIAGNOSIS — R2689 Other abnormalities of gait and mobility: Secondary | ICD-10-CM

## 2023-04-27 DIAGNOSIS — R262 Difficulty in walking, not elsewhere classified: Secondary | ICD-10-CM

## 2023-04-27 DIAGNOSIS — R278 Other lack of coordination: Secondary | ICD-10-CM

## 2023-04-27 DIAGNOSIS — M6281 Muscle weakness (generalized): Secondary | ICD-10-CM

## 2023-04-27 DIAGNOSIS — S14129S Central cord syndrome at unspecified level of cervical spinal cord, sequela: Secondary | ICD-10-CM

## 2023-04-27 NOTE — Therapy (Signed)
 Occupational Therapy Neuro Treatment Note          Patient Name: Sherry [name] MRN: [id number] DOB:04/08/1934, 89 y.o., female Today's Date: 11/29/2022  PCP:  Cindi Carbon PROVIDER: Earnestine Mealing, MD  END OF SESSION:   OT End of Session - 04/27/23 1425     Visit Number [id number]    Number of Visits 192    Date for OT Re-Evaluation 06/27/23    Authorization Time Period Progress report period starting 04/04/23    OT Start Time 1104    OT Stop Time 1145    OT Time Calculation (min) 41 min    Equipment Utilized During Treatment powered wheelchair    Activity Tolerance Patient tolerated treatment well    Behavior During Therapy WFL for tasks assessed/performed                     Past Medical History:  Diagnosis Date   Acute blood loss anemia    Arthritis    Cancer (HCC)    skin   Central cord syndrome at C6 level of cervical spinal cord (HCC) 11/29/2017   Hypertension    Protein-calorie malnutrition, severe (HCC) 01/24/2018   S/P insertion of IVC (inferior vena caval) filter 01/24/2018   Tetraparesis (HCC)    Past Surgical History:  Procedure Laterality Date   ANTERIOR CERVICAL DECOMP/DISCECTOMY FUSION N/A 11/29/2017   Procedure: Cervical five-six, six-seven Anterior Cervical Decompression Fusion;  Surgeon: Jadene Pierini, MD;  Location: [facility] OR;  Service: Neurosurgery;  Laterality: N/A;  Cervical five-six, six-seven Anterior Cervical Decompression Fusion   CATARACT EXTRACTION     FEMUR IM NAIL Left 08/16/2021   Procedure: INTRAMEDULLARY (IM) NAIL FEMORAL;  Surgeon: Deeann Saint, MD;  Location: [facility] ORS;  Service: Orthopedics;  Laterality: Left;   IR IVC FILTER PLMT / S&I /IMG GUID/MOD SED  12/08/2017   Patient Active Problem List   Diagnosis Date Noted   Age-related osteoporosis without current pathological fracture 07/27/2022   Mild recurrent major depression (HCC) 07/27/2022   Hypertensive heart disease with other congestive heart failure (HCC)  02/14/2022   Chronic indwelling Foley catheter 02/14/2022   Closed hip fracture (HCC) 08/15/2021   DVT (deep venous thrombosis) (HCC) 08/15/2021   Quadriplegia (HCC) 08/15/2021   Fall 08/15/2021   Constipation due to slow transit 08/31/2018   Trauma 06/05/2018   Neuropathic pain 06/05/2018   Neurogenic bowel 06/05/2018   Vaginal yeast infection 01/30/2018   Healthcare-associated pneumonia 01/25/2018   Chronic allergic rhinitis 01/24/2018   Depression with anxiety 01/24/2018   UTI due to Klebsiella species 01/24/2018   Protein-calorie malnutrition, severe (HCC) 01/24/2018   S/P insertion of IVC (inferior vena caval) filter 01/24/2018   Tetraparesis (HCC) 01/20/2018   Neurogenic bladder 01/20/2018   Reactive depression    Benign essential HTN    Acute postoperative anemia due to expected blood loss    Central cord syndrome at C6 level of cervical spinal cord (HCC) 11/29/2017   Allergy to alpha-gal 11/25/2016   SCC (squamous cell carcinoma) 04/08/2014   ONSET DATE: 11/29/2017  REFERRING DIAG: Central Cord Syndrome at C6 of the Cervical Spinal Cord, Fall with Bilateral Closed Hip Fractures, with ORIF repair with Intramedullary Nailing of the Left Hip Fracture.   THERAPY DIAG:  Muscle weakness (generalized)  Other lack of coordination  Rationale for Evaluation and Treatment: Rehabilitation  SUBJECTIVE:    SUBJECTIVE STATEMENT:  Pt. Reports  having had a nice visit with visiting family this past weekend. Pt accompanied by:  self, Personal Care aide  PERTINENT HISTORY: Central Cord Syndrome at C6 of the Cervical Spinal Cord, Fall with Bilateral Closed Hip Fractures, with ORIF repair with Intramedullary Nailing of the Left Hip Fracture.   PRECAUTIONS: None  WEIGHT BEARING RESTRICTIONS: No  PAIN:  Are you having pain? No pain, just shoulder stiffness   LIVING ENVIRONMENT: Lives with: [facility]  Stairs: No Has following equipment at home: Wheelchair  (power)  PLOF: Independent   PATIENT GOALS:  See below for established goals  OBJECTIVE:  Note: Objective measures were completed at Evaluation unless otherwise noted.  HAND DOMINANCE: Right  ADLs: Caregiver assist with ADLs, and [facility]  MOBILITY STATUS: Uses a power w/c  ACTIVITY TOLERANCE: Activity tolerance: Fair  FUNCTIONAL OUTCOME MEASURES:  Measurements:   12/09/2021:   Shoulder flexion: R: 120(136), L: 138(150) Shoulder abduction: R: 108(120), L: 110(120) Elbow: R: 0-148, L: 0-146 Wrist flexion: R: 55(62), L: 78 Writ extension: R: 34(55), L: 3(4) RD: R: 12(20), L: 16(26) UD: R: 8(24), L: 6(18) Thumb radial abduction: R: 11(20), L: 8(20) Digit flexion to the [facility]: R:  2nd: 5cm(3.5cm), 3rd: 7.5 cm(5cm), 4th: 7cm(3cm), 5th: 6cm(5.5cm) L: 2nd: 8cm(8cm), 3rd: 8cm(8cm), 4th: 7.5cm(7cm), 5th: 7cm(7cm)   02/03/2022:   Shoulder flexion: R: 122(138), L: 148(154) Shoulder abduction: R: 109(125), L: 125(125 Elbow: R: 0-148, L: 0-146 Wrist flexion: R: 60(68), L: 79 Writ extension: R: 40(55), L: 3(10) RD: R: 14(24), L: 20(28) UD: R: 8(24), L: 6(18) Thumb radial abduction: R: 20(25), L: 8(20) Digit flexion to the [facility]: R:  2nd: 4.5cm(3cm), 3rd: 6.5cm(4.5cm), 4th: 6.5 cm(3cm), 5th: 4.5cm(5cm) L: 2nd: 8cm(8cm), 3rd: 8cm(7cm), 4th: 7.5cm(7cm), 5th: 6.5cm(6.5cm)   04/07/2022:   Shoulder flexion: R: 125(150), L: 160(160) Shoulder abduction: R: 109(125), L: 125(125 Elbow: R: 0-148, L: 0-146 Wrist flexion: R: 60(68), L: 79 Writ extension: R: 40(55), L: 5(18) RD: R: 14(24), L: 20(28) UD: R: 18(24), L: 8(18) Thumb radial abduction: R: 20(25), L: 8(20) Digit flexion to the [facility]: R:  2nd: 4.5 cm(4 cm), 3rd: 6.5cm(3cm), 4th: 7 cm (5cm), 5th: 5cm(5cm) L: 2nd: 8cm(8cm), 3rd: 8cm(7cm), 4th: 7.5cm(7cm), 5th: 7cm(7cm)   07/05/2022:   Shoulder flexion: R: 125(154), L: 160(160) Shoulder abduction: R: 95(130), L: 143(143) Elbow: R: 0-148, L: 0-146 Wrist flexion: R: 60(68),  L: 79 Writ extension: R: 40(60), L: 0(12) RD: R: 10(24), L: 12(20) UD: R: 16(24), L: 8(16) Thumb radial abduction: R: 20(25), L: 8(20) Digit flexion to the [facility]: R:  2nd: 4 cm(4cm), 3rd: 6.5cm( 3.5cm), 4th: 7 cm (5cm), 5th: 6cm(5cm) L: 2nd: 8cm(8cm), 3rd: 8cm(7cm), 4th: 8cm(7cm), 5th: 7cm(7cm)   09/13/2022:   Shoulder flexion: R: 125(154), L: 160(160) Shoulder abduction: R: 100(130), L: 143(143) Elbow: R: 0-150, L: 0-150 Wrist flexion: R: 55(68), L: 79 Writ extension: R: 45(60), L: 0(12) RD: R: 10(24), L: 12(20) UD: R: 16(24), L: 8(16) Thumb radial abduction: R: 20(25), L: 8(20) Digit flexion to the [facility]: R:  2nd: 4 cm(4cm), 3rd: 6cm( 5cm), 4th: 6 cm (4cm), 5th: 5cm(5cm) L: 2nd: 7cm(cm), 3rd: 7cm(6cm), 4th: 7cm(6cm), 5th: 6cm(6cm)   10/27/2022   Shoulder flexion: R: 128(154), L: 160(160) Shoulder abduction: R: 105(130), L: 143(143) Elbow: R: 0-150, L: 0-150 Wrist flexion: R: 55(68), L: 79 Writ extension: R: 46(64), L: 0(20) RD: R: 10(20), L: 18(24) UD: R: 18(28), L: 8(20) Thumb radial abduction: R: 30(42), L:20(24) Digit flexion to the [facility]: R:  2nd: 4 cm(3cm), 3rd: 6cm( 6cm), 4th: 6 cm (5cm), 5th: 5cm(5cm) L: 2nd: 8cm(8cm), 3rd: 8cm(6cm),  4th: 8cm(7cm), 5th: 6cm(6cm)  01/19/2023  Shoulder flexion: R: 131(154), L: 160(160) Shoulder abduction: R: 110(130), L: 143(143) Elbow: R: 0-150, L: 0-150 Wrist flexion: R: 55(68), L: 79 Writ extension: R: 46(64), L: 0(20) RD: R: 10(20), L: 18(24) UD: R: 18(28), L: 14(20) Thumb radial abduction: R: 38(48), L:24(26) Thumb IP flexion: R: 35(50) L: 20(45) Digit flexion to the [facility]: R:  2nd: 3.5 cm(3cm), 3rd: 6cm( 6cm), 4th: 6 cm (5cm), 5th: 5cm(4.5cm) L: 2nd: 8cm(7.5cm), 3rd: 8cm(6cm), 4th: 7.5cm(6cm), 5th: 6cm(4.5cm)  04/04/2023  Shoulder flexion: R: 134(154), L: 160(160) Shoulder abduction: R: 114(130), L: 143(143) Elbow: R: WNL, L: WNL Wrist flexion: R: 55(68), L: 80 Writ extension: R: 46(64), L: -10(10) RD: R: 14(24), L:  20(24) UD: R: 20(30), L: 14(20) Thumb radial abduction: R: 38(48), L:28(30) Thumb IP flexion: R: 40(50) L: 25(45) Digit flexion to the [facility]: R:  2nd: 3.5 cm(3cm), 3rd: 6cm( 5cm), 4th: 6 cm (4cm), 5th: 5cm(5cm) L: 2nd: 8cm(7.5cm), 3rd: 8cm(7cm), 4th: 7.5cm(7cm), 5th: 7cm(6cm)   COORDINATION:   01/19/2023:  9 hole peg test:  Right: 2 pegs placed in 1 min. & 5 sec.  Pt. was able to remove 9 pegs in 17 sec.  Left: 2 pegs placed in 1 min. & 10 sec. Pt. was able to remove 9 pegs in 20 sec.  04/04/23:  Pt. was able to consistently grasp the pegs however each one slipped out of her fingers when attempting to place them into the Pegboard.  SENSATION: Intact  EDEMA: N/A   COGNITION: Overall cognitive status: WNL   VISION: Subjective report: Wears glasses, No changes in vision.  PERCEPTION: Intact  TODAY'S TREATMENT:                                                                                                                              DATE:  04/27/23   Manual Therapy:   Pt. tolerated soft tissue massage to the volar, and dorsal surface of each digit on the bilateral hands, digit lateral, and sagittal bands 2/2 to stiffness following moist heat modality. Soft tissue mobilization was performed for carpal, and metacarpal spread stretches. Manual therapy was performed independent of, and in preparation for therapeutic Ex.     Therapeutic Ex.    -Pt. tolerated PROM followed by AROM bilateral wrist extension, PROM for bilateral digit MP, PIP, and DIP flexion, and extension, thumb abduction.   Therapeutic Activities:  -facilitated bilateral hand coordination skills working on opening medication bottles of varying sizes while stabilizing the bottle in midline.   Neuromuscular re-education:    -Facilitated right hand [facility] skills using a lateral grasp to remove 1" resistive cubes from a velcro resistive board.  -Encouraged isolated 2nd digit extension to press them into place. -The  vertical board was placed at a vertical angle to encourage wrist extension.      PATIENT EDUCATION:  Education details: Bilateral hand function, ROM, and strengthening, reaching, and [facility]  Person educated: Patient Education method:  Explanation, Demonstration, and Verbal cues Education comprehension: verbalized understanding and returned demonstration  HOME EXERCISE PROGRAM: Continue to assess HEP needs, and provide as needed.   GOALS: Goals reviewed with patient? Yes  LONG TERM GOALS: Target date: 04/13/2023  3.  Patient will demonstrate improved composite finger flexion to be able to firmly hold and use adaptive devices during ADLs, and IADLs.  Baseline: 04/04/2023: Pt. continues to work on improving Digit flexion to the [facility] to be able to efficiently handle Adaptive equipment. 01/19/2023: Pt. Is improving with Bilateral digit flexion to the [facility]. Pt. has difficulty firmly holding adaptive devices. 12/06/2022: Pt. continues to present with increased MP, PIP, and DIP digit tightness/stiffness limiting the formulation of bilateral composites fists. 10/27/2022: Pt. presents with increased MP, PIP, and DIP digit tightness/stiffness limiting the formulation of bilateral composites fists during this progress reporting period limiting. Digit flexion to the [facility] R:  2nd: 4 cm(3cm), 3rd: 6cm( 6cm), 4th: 6 cm (5cm), 5th: 5cm(5cm), L: 2nd: 8cm(8cm), 3rd: 8cm(6cm), 4th: 8cm(7cm), 5th: 6cm(6cm) 09/20/2022: Improving digit flexion to the [facility]. 09/13/22: improved digit flexion to the [facility]. 07/05/2022: Pt. presents with digit MP, PIP, and DIP extensor tightness limiting her ability to securely grip objects in her bilateral hands.  04/07/2022: Pt. Has improved with right 2nd, and 3rd digit flexion towards the [facility]. Pt. Continues to have difficulty securely holding and applying deodorant.02/03/2022: Pt. Has improved with digit flexion, however, continues to have difficulty securely holding and using deodorant. 12/09/2021:  Bilateral hand/digit MP, PIP, and DIP extension tightness limits her ability achieve digit flexion to hold, and apply deodorant. 10/28/2021:  Pt. continues to have difficulty holding the deodorant. 01/12/2021: Pt. Presents with limited digit extension. Pt. Is able to initiate holding deodorant, however is unable to hold it while using it  Goal status: Ongoing   4.  Pt. Will improve bilateral wrist extension in preparation for anticipating, and initiating reaching for objects at the table.  Baseline: 04/04/23: R: 46(64) L: -10(10)01/19/2023:  R: 46(64) L: 0(20) 12/06/2022: Pt continues to to progress with bilateral wrist extension in preparation fro functional reaching. 10/27/2022: Pt. Is improving with bilateral wrist extension. R: 46(64) L: 0(20) 09/20/2022: limited PROM in the Left wrist extension 2/2 tightness/stiffness. 09/13/22: R: 55(68), L: 0(12) 07/19/2022: Bilateral wrist extension in limited. 07/05/2022: Right: 40(60) Left: 0(12) 04/07/2022: Right: 40(55) Left: 5(18) 02/03/2022: Right: 34(55) Left: 3(4) 12/09/2021: Right  34(55)  Left  3(4) 10/28/2021:  Right 22(38), Left 0(15) 09/14/2021:  Right: 17(35), left 2(15)  Goal status: Left wrist tightness noted. Ongoing  7.  Pt. Will increase bilateral lateral pinch strength by 2 lbs to be able to securely grasp items during ADLs, and IADL tasks.  Baseline: 01/19/2023: Deferred as the Pinch meter is out for calibration.12/06/2022: Pt. Is able to more securely hold objects during ADLs/IADLs 10/27/2022: Pt. Is improving with holding items with her bilateral thumbs. (Pinch meter out for calibration) 09/20/2022: lateral pinch continues to be limited 09/13/22: R 6.5# L 4# 07/19/2022: TBD 07/05/2022: TBD 07/2022: NT-Pinch meter out for calibration. 02/03/2022: Right: 6#, Left: 4# 12/09/2021: Right: 6#, Left: 4#  Goal status: Deferred   9.  Pt. will complete plant care with modified independence.  Baseline:12/06/2022: Pt. Is independent with plant care in her husband's  room, however is not able to access her plants due to furniture blocking access to them. 10/27/2022: Pt. Is able to water plants that are closer, and within reach. Pt. continues to have difficulty reaching for thorough plant care. 09/20/2022: Pt.  Continues to have difficulty reaching for thorough plant care. 09/13/22: continues to report intermittent difficulty 07/19/2022: Pt. Continues to be able to water, and care for some of her plants. Pt. Has more difficulty with plants that are harder to reach. 07/05/2022: pt. Is able to water, and care for some of her plants. Pt. Has more difficulty with plants that are harder to reach. 04/07/2022: Pt. is now able to hold a cup and water her plants.02/03/2022: Pt. has difficulty caring for her plants.  Goal status:Achieved   10.  Pt. will demonstrate adaptive techniques to assist with the efficiency of self-dressing, or morning care tasks.  Baseline: 04/04/2023:  Pt. continues to work on improving UE functioning to be able to efficiently use Adaptive equipment. 01/19/2203: Pt. Is able to donn her jacket in reverse independently. Pt. requires assist from staff, as Pt. Reports staff have decreased time. 12/06/2022: Pt. requires assist from staff, as Pt. Reports staff have decreased time. 10/27/2022: Pt. is able to assist with initiating UE dressing. Pt. requires assist from personal, and staff care aides. 09/20/2022: Pt. continues to require assist form personal, and staff care aides. 09/13/22: MODA dressing, reports she is often rushed 07/19/2022: Pt. continues to require assist with self-dressing/morning care tasks. 07/05/2022: Continue with goal. 04/07/2022: Pt. Continues to require assist with the efficiency of self-dressing, and morning care tasks.02/03/2022: Pt. requires assist from caregivers 2/2 time limitations during morning care.  Goal status: Ongoing  11.  Pt. will improve BUE strength to improve ADL, and IADL functioning.  Baseline: 04/04/2023: shoulder flexion L 4/5, R  3+/5; shoulder abduction L 4+/5, R 3+/5. elbow flexion B 5/5, elbow extension 4/5 01/19/2023: L 4/5, R 3+/5 12/06/2022: Continue 10/27/2022: shoulder flexion L 4/5, R 3+/5; shoulder abduction L 4+/5, R 3+/5. elbow flexion B 5/5, elbow extension 4/5 09/20/2022: Pt. Presents with limited BUE strength 09/13/22: shoulder flexion L 4/5, R 3/5; elbow flexion B 5/5, elbow extension 4-/5; shoulder abduction L 4+/5, R 3+/5. 07/19/2022: BUE strength continues to be limited. 07/05/2022: shoulder flexion: right 4-/5, abduction: 3+/5, elbow flexion: right: 5/5, left 5/5, extension: right: 4-/5, left 4-/5, wrist extension: right: 3-/5, left: 2-/5  Goal status: Ongoing  12.  Pt. will improve bilateral thumb radial abduction in order to be able to hold the grab bars while standing with PT  Baseline: 04/04/23: R: 38(48), Z:61(09) 01/19/2023: R: 38(48) L: 24(26)12/06/2022: Pt. Continues to present  with limited thumb abduction, however is improving holding onto the parallel bars.10/27/2022: thumb radial abduction: R: 30(42), L: 20(24  Goal status: Ongoing  13.  Pt. Will be able to securely hold, and stabilize medication bottles at a tabletop surface when opening, and closing them.  Baseline: 01/19/2023: Pt. Is now able to securely hold medication bottles while opening them, and stabilizes them on surfaces while opening them. 12/06/2022: Pt is improving with securely stabilizing medication bottles. 10/27/2022: Pt. Stabilizes bottles against her torso when attempting to open, and close them   Goal status: Achieved  14: Pt. Will improve bilateral thumb IP flexion to improve active grasping patterns.    Baseline: 04/04/2023:  R: 40(50) L: 25(45) 01/19/2023: Right 35(50), Left: 20/45)    Goal status: Ongoing    15. Pt. Will improve bilateral FMC/speed, and dexterity. As evidence by improved scores on the 9 hole peg test.  Baseline: 04/04/23: Pt. was able to consistently grasp the pegs however each one slipped out of her fingers when  attempting to place them into the Pegboard. 01/19/2023: 2 pegs placed in  1 min. & 5 sec.  Pt. was able to remove 9 pegs in 17 sec.; Left: 2 pegs placed in 1 min. & 10 sec. Pt. was able to remove 9 pegs in 20 sec.   Goal status: Ongoing   ASSESSMENT:  CLINICAL IMPRESSION:  Pt. tolerated UE manual therapy, FMC skills, and strengthening. Pt. required the medication bottles to be stabilized in midline at her torso while opening them without the contents spilling out. Pt. reports that medication typically spills on the floor when attempting to open them.  Pt. Required cues for visual demonstration of proper technique when removing resistive cubes, isolating the 2nd digit, and pressing them back into place. Pt. continues to present with digit MP, PIP, and DIP extensor tightness impacting her ability to formulate a full composite fist limiting gross grasping. Pt. continues to benefit from OT services to work on impoving ROM, and UE strength in order to work towards increasing bilateral hand grasp on objects, and increasing engagement of bilateral hands during ADLs, and IADL tasks.     PERFORMANCE DE PERFORMANCE DEFICITS: in functional skills including ADLs, IADLs, coordination, dexterity, ROM, strength, and UE functional use, cognitive skills including , and psychosocial skills including coping strategies and environmental adaptation.   IMPAIRMENTS: are limiting patient from ADLs, IADLs, and leisure.   CO-MORBIDITIES: may have co-morbidities  that affects occupational performance. Patient will benefit from skilled OT to address above impairments and improve overall function.  MODIFICATION OR ASSISTANCE TO COMPLETE EVALUATION: Min-Moderate modification of tasks or assist with assess necessary to complete an evaluation.  OT OCCUPATIONAL PROFILE AND HISTORY: Detailed assessment: Review of records and additional review of physical, cognitive, psychosocial history related to current functional  performance.  CLINICAL DECISION MAKING: Moderate - several treatment options, min-mod task modification necessary  REHAB POTENTIAL: Good for stated goals  PLAN:  OT FREQUENCY 2x's a week  OT DURATION: 12 weeks  PLANNED INTERVENTIONS ADL training, A/E training, UE ther. Ex, Manual therapy, neuromuscular re-education, moist heat modality, Paraffin Bath, Splinting, and  Pt./caregiver education   RECOMMENDED OTHER SERVICES: PT  CONSULTED AND AGREED WITH PLAN OF CARE: Patient  PLAN FOR NEXT SESSION: Continue Treatment as per established POC  Olegario Messier, MS, OTR/L  04/27/23, 2:30 PM

## 2023-04-27 NOTE — Therapy (Signed)
 OUTPATIENT PHYSICAL THERAPY NEURO TREATMENT    Patient Name: Sherry [name] MRN: [id number] DOB:11/04/1934, 89 y.o., female Today's Date: 04/27/2023   PCP: Earnestine Mealing, MD REFERRING PROVIDER: Deeann Saint   PT End of Session - 04/27/23 2227     Visit Number 107    Number of Visits 124    Date for PT Re-Evaluation 06/20/23    Authorization Type [employer] FOTO performed by PT on eval (7/20), score 12, Progress note on 03/10/2020; PN on 08/20/2020    Authorization Time Period Initial cert 05/10/8117-14/78/2956; Recert 01/11/2022-04/05/2022; Recert 04/07/2022-06/30/2022    Progress Note Due on Visit 110    PT Start Time 1145    PT Stop Time 1226    PT Time Calculation (min) 41 min    Equipment Utilized During Treatment Gait belt    Activity Tolerance Patient tolerated treatment well    Behavior During Therapy WFL for tasks assessed/performed                     Past Medical History:  Diagnosis Date   Acute blood loss anemia    Arthritis    Cancer (HCC)    skin   Central cord syndrome at C6 level of cervical spinal cord (HCC) 11/29/2017   Hypertension    Protein-calorie malnutrition, severe (HCC) 01/24/2018   S/P insertion of IVC (inferior vena caval) filter 01/24/2018   Tetraparesis (HCC)    Past Surgical History:  Procedure Laterality Date   ANTERIOR CERVICAL DECOMP/DISCECTOMY FUSION N/A 11/29/2017   Procedure: Cervical five-six, six-seven Anterior Cervical Decompression Fusion;  Surgeon: Jadene Pierini, MD;  Location: [facility] OR;  Service: Neurosurgery;  Laterality: N/A;  Cervical five-six, six-seven Anterior Cervical Decompression Fusion   CATARACT EXTRACTION     FEMUR IM NAIL Left 08/16/2021   Procedure: INTRAMEDULLARY (IM) NAIL FEMORAL;  Surgeon: Deeann Saint, MD;  Location: [facility] ORS;  Service: Orthopedics;  Laterality: Left;   IR IVC FILTER PLMT / S&I /IMG GUID/MOD SED  12/08/2017   Patient Active Problem List   Diagnosis Date Noted   Age-related  osteoporosis without current pathological fracture 07/27/2022   Mild recurrent major depression (HCC) 07/27/2022   Hypertensive heart disease with other congestive heart failure (HCC) 02/14/2022   Chronic indwelling Foley catheter 02/14/2022   Closed hip fracture (HCC) 08/15/2021   DVT (deep venous thrombosis) (HCC) 08/15/2021   Quadriplegia (HCC) 08/15/2021   Fall 08/15/2021   Constipation due to slow transit 08/31/2018   Trauma 06/05/2018   Neuropathic pain 06/05/2018   Neurogenic bowel 06/05/2018   Vaginal yeast infection 01/30/2018   Healthcare-associated pneumonia 01/25/2018   Chronic allergic rhinitis 01/24/2018   Depression with anxiety 01/24/2018   UTI due to Klebsiella species 01/24/2018   Protein-calorie malnutrition, severe (HCC) 01/24/2018   S/P insertion of IVC (inferior vena caval) filter 01/24/2018   Tetraparesis (HCC) 01/20/2018   Neurogenic bladder 01/20/2018   Reactive depression    Benign essential HTN    Acute postoperative anemia due to expected blood loss    Central cord syndrome at C6 level of cervical spinal cord (HCC) 11/29/2017   Allergy to alpha-gal 11/25/2016   SCC (squamous cell carcinoma) 04/08/2014    ONSET DATE: 08/15/2021 (fall with B hip fx); Initial injury was in 2019- quadraplegia due to central cord syndrome of C6 secondary to neck fracture.   REFERRING DIAG: Bilateral Hip fx; Left Open- s/p Left IM nail femoral on 08/16/2021; Right closed  THERAPY DIAG:  Muscle weakness (generalized)  Other lack of coordination  Central cord syndrome, sequela (HCC)  Other abnormalities of gait and mobility  Difficulty in walking, not elsewhere classified  Closed fracture of both hips, initial encounter (HCC)  Abnormality of gait and mobility  Rationale for Evaluation and Treatment Rehabilitation  SUBJECTIVE:   SUBJECTIVE STATEMENT:   Patient reports feeling good with no new complaints.         Pt accompanied by:  Paid caregiver-   PERTINENT  HISTORY: Sherry [name] is an 87yoF who experienced a fall at home on 08/15/2021 with hip fracture, s/p Left hip ORIF. PMH: quadriplegia due to central cord syndrome of C6 secondary to neck fracture, HTN, depression with anxiety, neurogenic bladder, bilateral DVT on Xarelto (s/p of IVC filter placement). Prior history of significant PT/OT with improvement of function. Pt has full use of shoulder/elbows, limited use of hands, adaptive self feeding sucessful.     PAIN:  None current but does endorse some left thigh pain intermittent  OBJECTIVE:    TODAY'S TREATMENT:   3/262025    Therapeutic activities:   -side bending- fingers reaching toward floor x 10 reps each -Dynamic reaching (cross body UE reaching) to improve ant and lateral weight shift- x 20 reps -manual resistive lumbar extension- 3x 12 reps   Therex LAQ active 3 x 12 reps  Seated hip abd 3 x 12 reps each LE Seated hip add 2 x 10 reps with 5 sec hold- ball squeeze Hamstring curl with RTB 2 sets of 15 reps    PATIENT EDUCATION: Education details: Exercise technique, Discussion of how to obtain order/F2F for a new brace Patient verbalized understanding.   HOME EXERCISE PROGRAM: No changes at this time  Access Code: [facility] URL: [email or web address] Date: 11/23/2021 Prepared by: Precious Bard Exercises - Seated Gluteal Sets - 2 x daily - 7 x weekly - 2 sets - 10 reps - 5 hold - Seated Quad Set - 2 x daily - 7 x weekly - 2 sets - 10 reps - 5 hold - Seated Long Arc Quad - 2 x daily - 7 x weekly - 2 sets - 10 reps - 5 hold - Seated March - 2 x daily - 7 x weekly - 2 sets - 10 reps - 5 hold - Seated Hip Abduction - 2 x daily - 7 x weekly - 2 sets - 10 reps - 5 hold - Seated Shoulder Shrugs - 2 x daily - 7 x weekly - 2 sets - 10 reps - 5 hold - Wheelchair Pressure Relief - 2 x daily - 7 x weekly - 2 sets - 10 reps - 5 hold  GOALS: Goals reviewed with patient? Yes  SHORT TERM GOALS: Target date: 02/22/2022  Pt  will be independent with initial UE strengthening HEP in order to improve strength and balance in order to decrease fall risk and improve function at home and work. Baseline: 10/19/2021- patient with no formal UE HEP; 12/02/2021= Patient verbalized knowledge of HEP including use of theraband for UE strengthening.  Goal status: GOAL MET  LONG TERM GOALS: Target date: 06/20/2023  Pt will be independent with final for UE/LE HEP in order to improve strength and balance in order to decrease fall risk and improve function at home and work. Baseline: Patient is currently BLE NWB and unable to participate in HEP. Has order for UE strengthening. 01/11/2022- Patient now able to participate in LE strengthening although NWB still- good understanding for some basic exercises. Will keep goal active to incorporate  progressive LE strengthening exercises. 04/07/2022= Patient still participating in progressive LE seated HEP and has no questions at this time.  Goal status: MET  2.  Pt will improve FOTO to target score of 35  to display perceived improvements in ability to complete ADL's.  Baseline: 10/19/2021= 12; 12/02/2021= 17; 01/11/2022=17; 04/07/2022= 17; 07/05/2022=17; 10/06/2022- outcome measure not appropriate as patient in non-ambulatory and not consistently standing. Goal status: Goal not appropriate  3.  Pt will increase strength of B UE  by at least 1/2 MMT grade in order to demonstrate improvement in strength and function  Baseline: patient has range of 2-/5 to 4/5 BUE Strength; 12/02/2021= 4/5 except with wrist ext. 01/11/2022= 4/5 B UE strength expept for wrist Goal status: Goal revised-no longer appropriate- working with OT on all UE strengthening.   4. Pt. Will increase strength of RLE by at least 1/2 MMT grade in order to demonstrate improvement in Standing/transfers.  Baseline: 2-/5 Right hip flex/knee ext/flex; 04/07/2022= 2- with right hip flex/knee ext (lacking 28 deg from zero) 4/8= Patient able to ext right  knee lacking 18 deg from zero. 07/05/2022=Left knee approx 8 deg from zero and right knee = 23 deg from zero; 12/29/2022 - Left LE = full ROM 4-/5 and right knee ext= 23 deg from zero= 2+/5 (gravity minimized); 03/28/2023= Left knee ext=  4/5 and right knee ext= 3-/5 (able to move lower leg through incomplete ROM -lacking 20 deg from full ROM against gravity)  Goal status: Progressing  5. Pt. Will demo ability to stand pivot transfer with max assist for improved functional mobility and less dependent need on mechanical device.   Baseline: Dependent on hoyer lift for all transfers; 04/07/2022- Unable to test secondary to patient with recent UTI and right hand procedure - will attempt to reassess next visit. ; 04/12/22: Unable to successfully stand due to feet plates on loaned power w/c; 05/10/2022- Patient still having to use rental chair and has not received her original power w/c back to practice SPT. 07/04/2021= Patient just received new power w/c and attempted standing from 1st time today- Patient able to stand with max assist today from w/c and did not attempt to perform pivot transfer- difficulty with static standing and placing weight on right LE. 10/06/2022- Not assessed today- patient needs new AFO prior to attempting transfers; 12/29/2022= Patient has not attempted SPT yet - still trying to improve LE strength enough to stand well without knee buckling with weight shifting. 03/28/2023- not attempted today due to ill fitting brace but patient last performed SPT on 01/10/23 and was able to perform with Max A with feet pre-positioned to angle toward direction of transfer  Goal status: ONGOING  6. Pt. Will demonstrate improved functional LE strength as seen by ability to stand > 2 min for improved transfer ability and pregait abilities.   Baseline: not assessed today due to recent dx: UTI; 04/07/2022- Unable to test secondary to patient with recent UTI and right hand procedure - will attempt to reassess next visit;  04/12/22: Unable to successfully stand due to feet plates on loaned power w/c 05/10/2022- Patient still having to use rental chair and has not received her original power w/c back to practice Standing. 07/05/2022- Patient able to stand with max assist at trunk today for 2 min 3 sec. Will keep goal active to ensure consistency. 10/06/2022= Unable to assess today- patient with some recent blood pressure issues and needs new AFO. 11/24/2022= Patient demonstrated multiple rounds of standing in //  bars- holding up to 1:45 min today but has been able to exhibit 2 min in recent past- working on consistency. 12/29/2022= 2 min 40 sec with patient demonstrating improved overall posture and ability to contract glutes and quads with just min assist at trunk today.   Goal status: MET  7. Pt. Will demonstrate improved functional LE strength as seen by ability to stand > 5 min for improved transfer ability and pregait abilities. Baseline: 12/29/2022= 2 min 40 sec with patient demonstrating improved overall posture and ability to contract glutes and quads with just min assist at trunk today. 03/28/2023= Attempted 2 trials today - at best 2 min 48 sec today but during cert- did stand for 3 min 44 sec.  Goal Status: Ongoing- Patient will likely receive new AFO in upcoming weeks and anticipate more standing time once received.  8. Patient will perform sit to stand transfer with moderate assist consistently > 75% of time for improved transfer ability and less dependence on caregiver.   Baseline: 12/29/2022- Patient currently max assist level assistance with all sit to stand activities. 03/28/2023= Patient is still majority of max assist to stand yet able to pull up from wheelchair smoother in one motion.   Goal status: ONGOING   ASSESSMENT:  CLINICAL IMPRESSION:   Treatment continued with seated LE strengthening while waiting on customized brace. She was able to increase resistance with ham curl and abel to increased sets without  significant muscle fatigue today. Pt will continue to benefit from skilled PT services to optimize independence and reduced caregiver support.   Will focus more on transfers, Sit to stand and standing once patient receives AFO.   OBJECTIVE IMPAIRMENTS Abnormal gait, decreased activity tolerance, decreased balance, decreased coordination, decreased endurance, decreased mobility, difficulty walking, decreased ROM, decreased strength, hypomobility, impaired flexibility, impaired UE functional use, postural dysfunction, and pain.   ACTIVITY LIMITATIONS carrying, lifting, bending, sitting, standing, squatting, sleeping, stairs, transfers, bed mobility, continence, bathing, toileting, dressing, self feeding, reach over head, hygiene/grooming, and caring for others  PARTICIPATION LIMITATIONS: meal prep, cleaning, laundry, medication management, personal finances, interpersonal relationship, driving, shopping, community activity, and yard work  PERSONAL FACTORS Age, Time since onset of injury/illness/exacerbation, and 1-2 comorbidities: HTN, cervical Sx  are also affecting patient's functional outcome.   REHAB POTENTIAL: Good  CLINICAL DECISION MAKING: Evolving/moderate complexity  EVALUATION COMPLEXITY: Moderate  PLAN: PT FREQUENCY: 1-2x/week  PT DURATION: 12 weeks  PLANNED INTERVENTIONS: Therapeutic exercises, Therapeutic activity, Neuromuscular re-education, Balance training, Gait training, Patient/Family education, Self Care, Joint mobilization, DME instructions, Dry Needling, Electrical stimulation, Wheelchair mobility training, Spinal mobilization, Cryotherapy, Moist heat, and Manual therapy  PLAN FOR NEXT SESSION:  LE and core strength training, Sit to stand and static standing; progress to SPT as able.    10:34 PM, 04/27/23 Louis Meckel, PT Physical Therapist -  [facility]  Outpatient Physical Therapy- Main Campus [phone]

## 2023-05-02 ENCOUNTER — Ambulatory Visit: Payer: Medicare HMO

## 2023-05-02 DIAGNOSIS — S14129S Central cord syndrome at unspecified level of cervical spinal cord, sequela: Secondary | ICD-10-CM

## 2023-05-02 DIAGNOSIS — S72001A Fracture of unspecified part of neck of right femur, initial encounter for closed fracture: Secondary | ICD-10-CM

## 2023-05-02 DIAGNOSIS — R262 Difficulty in walking, not elsewhere classified: Secondary | ICD-10-CM

## 2023-05-02 DIAGNOSIS — M6281 Muscle weakness (generalized): Secondary | ICD-10-CM | POA: Diagnosis not present

## 2023-05-02 DIAGNOSIS — R269 Unspecified abnormalities of gait and mobility: Secondary | ICD-10-CM

## 2023-05-02 DIAGNOSIS — R2689 Other abnormalities of gait and mobility: Secondary | ICD-10-CM

## 2023-05-02 DIAGNOSIS — R278 Other lack of coordination: Secondary | ICD-10-CM

## 2023-05-02 DIAGNOSIS — R296 Repeated falls: Secondary | ICD-10-CM

## 2023-05-02 DIAGNOSIS — R2681 Unsteadiness on feet: Secondary | ICD-10-CM

## 2023-05-02 NOTE — Therapy (Signed)
 OUTPATIENT PHYSICAL THERAPY NEURO TREATMENT    Patient Name: Sherry [name] MRN: [id number] DOB:[birth year], 89 y.o., female Today's Date: 05/02/2023   PCP: Earnestine Mealing, MD REFERRING PROVIDER: Deeann Saint   PT End of Session - 05/02/23 1345     Visit Number 108    Number of Visits 124    Date for PT Re-Evaluation 06/20/23    Authorization Type [employer] FOTO performed by PT on eval (7/20), score 12, Progress note on 03/10/2020; PN on 08/20/2020    Authorization Time Period Initial cert 2/84/1324-40/11/2723; Recert 01/11/2022-04/05/2022; Recert 04/07/2022-06/30/2022    Progress Note Due on Visit 110    PT Start Time 1146    PT Stop Time 1227    PT Time Calculation (min) 41 min    Equipment Utilized During Treatment Gait belt    Activity Tolerance Patient tolerated treatment well    Behavior During Therapy WFL for tasks assessed/performed                      Past Medical History:  Diagnosis Date   Acute blood loss anemia    Arthritis    Cancer (HCC)    skin   Central cord syndrome at C6 level of cervical spinal cord (HCC) 11/29/2017   Hypertension    Protein-calorie malnutrition, severe (HCC) 01/24/2018   S/P insertion of IVC (inferior vena caval) filter 01/24/2018   Tetraparesis (HCC)    Past Surgical History:  Procedure Laterality Date   ANTERIOR CERVICAL DECOMP/DISCECTOMY FUSION N/A 11/29/2017   Procedure: Cervical five-six, six-seven Anterior Cervical Decompression Fusion;  Surgeon: Jadene Pierini, MD;  Location: [facility] OR;  Service: Neurosurgery;  Laterality: N/A;  Cervical five-six, six-seven Anterior Cervical Decompression Fusion   CATARACT EXTRACTION     FEMUR IM NAIL Left 08/16/2021   Procedure: INTRAMEDULLARY (IM) NAIL FEMORAL;  Surgeon: Deeann Saint, MD;  Location: [facility] ORS;  Service: Orthopedics;  Laterality: Left;   IR IVC FILTER PLMT / S&I /IMG GUID/MOD SED  12/08/2017   Patient Active Problem List   Diagnosis Date Noted    Age-related osteoporosis without current pathological fracture 07/27/2022   Mild recurrent major depression (HCC) 07/27/2022   Hypertensive heart disease with other congestive heart failure (HCC) 02/14/2022   Chronic indwelling Foley catheter 02/14/2022   Closed hip fracture (HCC) 08/15/2021   DVT (deep venous thrombosis) (HCC) 08/15/2021   Quadriplegia (HCC) 08/15/2021   Fall 08/15/2021   Constipation due to slow transit 08/31/2018   Trauma 06/05/2018   Neuropathic pain 06/05/2018   Neurogenic bowel 06/05/2018   Vaginal yeast infection 01/30/2018   Healthcare-associated pneumonia 01/25/2018   Chronic allergic rhinitis 01/24/2018   Depression with anxiety 01/24/2018   UTI due to Klebsiella species 01/24/2018   Protein-calorie malnutrition, severe (HCC) 01/24/2018   S/P insertion of IVC (inferior vena caval) filter 01/24/2018   Tetraparesis (HCC) 01/20/2018   Neurogenic bladder 01/20/2018   Reactive depression    Benign essential HTN    Acute postoperative anemia due to expected blood loss    Central cord syndrome at C6 level of cervical spinal cord (HCC) 11/29/2017   Allergy to alpha-gal 11/25/2016   SCC (squamous cell carcinoma) 04/08/2014    ONSET DATE: 08/15/2021 (fall with B hip fx); Initial injury was in 2019- quadraplegia due to central cord syndrome of C6 secondary to neck fracture.   REFERRING DIAG: Bilateral Hip fx; Left Open- s/p Left IM nail femoral on 08/16/2021; Right closed  THERAPY DIAG:  Muscle weakness (generalized)  Other lack of coordination  Central cord syndrome, sequela (HCC)  Other abnormalities of gait and mobility  Difficulty in walking, not elsewhere classified  Closed fracture of both hips, initial encounter (HCC)  Abnormality of gait and mobility  Unsteadiness on feet  Repeated falls  Rationale for Evaluation and Treatment Rehabilitation  SUBJECTIVE:   SUBJECTIVE STATEMENT:   Patient reports doing well without any new complaints.  Denies any falls or pain.         Pt accompanied by:  Paid caregiver-   PERTINENT HISTORY: Sherry [name] is an 89yoF who experienced a fall at home on 08/15/2021 with hip fracture, s/p Left hip ORIF. PMH: quadriplegia due to central cord syndrome of C6 secondary to neck fracture, HTN, depression with anxiety, neurogenic bladder, bilateral DVT on Xarelto (s/p of IVC filter placement). Prior history of significant PT/OT with improvement of function. Pt has full use of shoulder/elbows, limited use of hands, adaptive self feeding sucessful.     PAIN:  None current but does endorse some left thigh pain intermittent  OBJECTIVE:    TODAY'S TREATMENT:   05/02/23   Therapeutic activities:  -SPT from powerwheelchair to mat table - Max assist with difficulty pivoting right LE today.  -Static sitting with no trunk support (patient with some diffculty- leaning back with repetitive VC to lean forward)  -Dynamic forward reaching 2 sets x 10  -Seated active ham curl (foot on sliding disc) 2 sets x 10 reps to improve LE function with LE foot placement for transfers. -side bending- forearm pressure on Peanut shaped theraball- 5 sec hold x approx 12 reps each side - Patient reports a good stretch on opp trunk/hip region -Dynamic reaching (cross body UE reaching) to improve ant and lateral weight shift- x 20 reps -Seated knee ext 2 x 12 reps alt LE (active)    Therex LAQ active 3 x 12 reps  Seated hip abd 3 x 12 reps each LE Seated hip add 2 x 10 reps with 5 sec hold- ball squeeze Hamstring curl with RTB 2 sets of 15 reps    PATIENT EDUCATION: Education details: Exercise technique, Discussion of how to obtain order/F2F for a new brace Patient verbalized understanding.   HOME EXERCISE PROGRAM: No changes at this time  Access Code: [facility] URL: [email or web address] Date: 11/23/2021 Prepared by: Precious Bard Exercises - Seated Gluteal Sets - 2 x daily - 7 x weekly - 2 sets - 10  reps - 5 hold - Seated Quad Set - 2 x daily - 7 x weekly - 2 sets - 10 reps - 5 hold - Seated Long Arc Quad - 2 x daily - 7 x weekly - 2 sets - 10 reps - 5 hold - Seated March - 2 x daily - 7 x weekly - 2 sets - 10 reps - 5 hold - Seated Hip Abduction - 2 x daily - 7 x weekly - 2 sets - 10 reps - 5 hold - Seated Shoulder Shrugs - 2 x daily - 7 x weekly - 2 sets - 10 reps - 5 hold - Wheelchair Pressure Relief - 2 x daily - 7 x weekly - 2 sets - 10 reps - 5 hold  GOALS: Goals reviewed with patient? Yes  SHORT TERM GOALS: Target date: 02/22/2022  Pt will be independent with initial UE strengthening HEP in order to improve strength and balance in order to decrease fall risk and improve function at home and work. Baseline: 10/19/2021- patient with no formal UE  HEP; 12/02/2021= Patient verbalized knowledge of HEP including use of theraband for UE strengthening.  Goal status: GOAL MET  LONG TERM GOALS: Target date: 06/20/2023  Pt will be independent with final for UE/LE HEP in order to improve strength and balance in order to decrease fall risk and improve function at home and work. Baseline: Patient is currently BLE NWB and unable to participate in HEP. Has order for UE strengthening. 01/11/2022- Patient now able to participate in LE strengthening although NWB still- good understanding for some basic exercises. Will keep goal active to incorporate progressive LE strengthening exercises. 04/07/2022= Patient still participating in progressive LE seated HEP and has no questions at this time.  Goal status: MET  2.  Pt will improve FOTO to target score of 35  to display perceived improvements in ability to complete ADL's.  Baseline: 10/19/2021= 12; 12/02/2021= 17; 01/11/2022=17; 04/07/2022= 17; 07/05/2022=17; 10/06/2022- outcome measure not appropriate as patient in non-ambulatory and not consistently standing. Goal status: Goal not appropriate  3.  Pt will increase strength of B UE  by at least 1/2 MMT grade in order to  demonstrate improvement in strength and function  Baseline: patient has range of 2-/5 to 4/5 BUE Strength; 12/02/2021= 4/5 except with wrist ext. 01/11/2022= 4/5 B UE strength expept for wrist Goal status: Goal revised-no longer appropriate- working with OT on all UE strengthening.   4. Pt. Will increase strength of RLE by at least 1/2 MMT grade in order to demonstrate improvement in Standing/transfers.  Baseline: 2-/5 Right hip flex/knee ext/flex; 04/07/2022= 2- with right hip flex/knee ext (lacking 28 deg from zero) 4/8= Patient able to ext right knee lacking 18 deg from zero. 07/05/2022=Left knee approx 8 deg from zero and right knee = 23 deg from zero; 12/29/2022 - Left LE = full ROM 4-/5 and right knee ext= 23 deg from zero= 2+/5 (gravity minimized); 03/28/2023= Left knee ext=  4/5 and right knee ext= 3-/5 (able to move lower leg through incomplete ROM -lacking 20 deg from full ROM against gravity)  Goal status: Progressing  5. Pt. Will demo ability to stand pivot transfer with max assist for improved functional mobility and less dependent need on mechanical device.   Baseline: Dependent on hoyer lift for all transfers; 04/07/2022- Unable to test secondary to patient with recent UTI and right hand procedure - will attempt to reassess next visit. ; 04/12/22: Unable to successfully stand due to feet plates on loaned power w/c; 05/10/2022- Patient still having to use rental chair and has not received her original power w/c back to practice SPT. 07/04/2021= Patient just received new power w/c and attempted standing from 1st time today- Patient able to stand with max assist today from w/c and did not attempt to perform pivot transfer- difficulty with static standing and placing weight on right LE. 10/06/2022- Not assessed today- patient needs new AFO prior to attempting transfers; 12/29/2022= Patient has not attempted SPT yet - still trying to improve LE strength enough to stand well without knee buckling with weight  shifting. 03/28/2023- not attempted today due to ill fitting brace but patient last performed SPT on 01/10/23 and was able to perform with Max A with feet pre-positioned to angle toward direction of transfer  Goal status: ONGOING  6. Pt. Will demonstrate improved functional LE strength as seen by ability to stand > 2 min for improved transfer ability and pregait abilities.   Baseline: not assessed today due to recent dx: UTI; 04/07/2022- Unable to test secondary  to patient with recent UTI and right hand procedure - will attempt to reassess next visit; 04/12/22: Unable to successfully stand due to feet plates on loaned power w/c 05/10/2022- Patient still having to use rental chair and has not received her original power w/c back to practice Standing. 07/05/2022- Patient able to stand with max assist at trunk today for 2 min 3 sec. Will keep goal active to ensure consistency. 10/06/2022= Unable to assess today- patient with some recent blood pressure issues and needs new AFO. 11/24/2022= Patient demonstrated multiple rounds of standing in // bars- holding up to 1:45 min today but has been able to exhibit 2 min in recent past- working on consistency. 12/29/2022= 2 min 40 sec with patient demonstrating improved overall posture and ability to contract glutes and quads with just min assist at trunk today.   Goal status: MET  7. Pt. Will demonstrate improved functional LE strength as seen by ability to stand > 5 min for improved transfer ability and pregait abilities. Baseline: 12/29/2022= 2 min 40 sec with patient demonstrating improved overall posture and ability to contract glutes and quads with just min assist at trunk today. 03/28/2023= Attempted 2 trials today - at best 2 min 48 sec today but during cert- did stand for 3 min 44 sec.  Goal Status: Ongoing- Patient will likely receive new AFO in upcoming weeks and anticipate more standing time once received.  8. Patient will perform sit to stand transfer with moderate  assist consistently > 75% of time for improved transfer ability and less dependence on caregiver.   Baseline: 12/29/2022- Patient currently max assist level assistance with all sit to stand activities. 03/28/2023= Patient is still majority of max assist to stand yet able to pull up from wheelchair smoother in one motion.   Goal status: ONGOING   ASSESSMENT:  CLINICAL IMPRESSION:   Treatment focused on more trunk control as patient was demonstrating more posterior lean. She was fatigue with all dynamic trunk activities and stated sitting was tough after several min. Pt will continue to benefit from skilled PT services to optimize independence and reduced caregiver support.   Will focus more on transfers, Sit to stand and standing once patient receives AFO.   OBJECTIVE IMPAIRMENTS Abnormal gait, decreased activity tolerance, decreased balance, decreased coordination, decreased endurance, decreased mobility, difficulty walking, decreased ROM, decreased strength, hypomobility, impaired flexibility, impaired UE functional use, postural dysfunction, and pain.   ACTIVITY LIMITATIONS carrying, lifting, bending, sitting, standing, squatting, sleeping, stairs, transfers, bed mobility, continence, bathing, toileting, dressing, self feeding, reach over head, hygiene/grooming, and caring for others  PARTICIPATION LIMITATIONS: meal prep, cleaning, laundry, medication management, personal finances, interpersonal relationship, driving, shopping, community activity, and yard work  PERSONAL FACTORS Age, Time since onset of injury/illness/exacerbation, and 1-2 comorbidities: HTN, cervical Sx  are also affecting patient's functional outcome.   REHAB POTENTIAL: Good  CLINICAL DECISION MAKING: Evolving/moderate complexity  EVALUATION COMPLEXITY: Moderate  PLAN: PT FREQUENCY: 1-2x/week  PT DURATION: 12 weeks  PLANNED INTERVENTIONS: Therapeutic exercises, Therapeutic activity, Neuromuscular re-education, Balance  training, Gait training, Patient/Family education, Self Care, Joint mobilization, DME instructions, Dry Needling, Electrical stimulation, Wheelchair mobility training, Spinal mobilization, Cryotherapy, Moist heat, and Manual therapy  PLAN FOR NEXT SESSION:  LE and core strength training, Sit to stand and static standing; progress to SPT as able.    1:52 PM, 05/02/23 Louis Meckel, PT Physical Therapist - [facility]  Outpatient Physical Therapy- Main [facility] [phone]

## 2023-05-02 NOTE — Therapy (Signed)
 Occupational Therapy Neuro Treatment Note   Patient Name: Sherry [name] MRN: [id number] DOB:09-28-1934, 89 y.o., female Today's Date: 11/29/2022  PCP:  Cindi Carbon PROVIDER: Earnestine Mealing, MD  END OF SESSION:   OT End of Session - 05/02/23 1608     Visit Number [id number]    Number of Visits 192    Date for OT Re-Evaluation 06/27/23    Authorization Time Period Progress report period starting 04/04/23    Progress Note Due on Visit 10    OT Start Time 1105    OT Stop Time 1145    OT Time Calculation (min) 40 min    Equipment Utilized During Treatment powered wheelchair    Activity Tolerance Patient tolerated treatment well    Behavior During Therapy WFL for tasks assessed/performed                     Past Medical History:  Diagnosis Date   Acute blood loss anemia    Arthritis    Cancer (HCC)    skin   Central cord syndrome at C6 level of cervical spinal cord (HCC) 11/29/2017   Hypertension    Protein-calorie malnutrition, severe (HCC) 01/24/2018   S/P insertion of IVC (inferior vena caval) filter 01/24/2018   Tetraparesis (HCC)    Past Surgical History:  Procedure Laterality Date   ANTERIOR CERVICAL DECOMP/DISCECTOMY FUSION N/A 11/29/2017   Procedure: Cervical five-six, six-seven Anterior Cervical Decompression Fusion;  Surgeon: Jadene Pierini, MD;  Location: [facility] OR;  Service: Neurosurgery;  Laterality: N/A;  Cervical five-six, six-seven Anterior Cervical Decompression Fusion   CATARACT EXTRACTION     FEMUR IM NAIL Left 08/16/2021   Procedure: INTRAMEDULLARY (IM) NAIL FEMORAL;  Surgeon: Deeann Saint, MD;  Location: [facility] ORS;  Service: Orthopedics;  Laterality: Left;   IR IVC FILTER PLMT / S&I /IMG GUID/MOD SED  12/08/2017   Patient Active Problem List   Diagnosis Date Noted   Age-related osteoporosis without current pathological fracture 07/27/2022   Mild recurrent major depression (HCC) 07/27/2022   Hypertensive heart disease with other congestive  heart failure (HCC) 02/14/2022   Chronic indwelling Foley catheter 02/14/2022   Closed hip fracture (HCC) 08/15/2021   DVT (deep venous thrombosis) (HCC) 08/15/2021   Quadriplegia (HCC) 08/15/2021   Fall 08/15/2021   Constipation due to slow transit 08/31/2018   Trauma 06/05/2018   Neuropathic pain 06/05/2018   Neurogenic bowel 06/05/2018   Vaginal yeast infection 01/30/2018   Healthcare-associated pneumonia 01/25/2018   Chronic allergic rhinitis 01/24/2018   Depression with anxiety 01/24/2018   UTI due to Klebsiella species 01/24/2018   Protein-calorie malnutrition, severe (HCC) 01/24/2018   S/P insertion of IVC (inferior vena caval) filter 01/24/2018   Tetraparesis (HCC) 01/20/2018   Neurogenic bladder 01/20/2018   Reactive depression    Benign essential HTN    Acute postoperative anemia due to expected blood loss    Central cord syndrome at C6 level of cervical spinal cord (HCC) 11/29/2017   Allergy to alpha-gal 11/25/2016   SCC (squamous cell carcinoma) 04/08/2014   ONSET DATE: 11/29/2017  REFERRING DIAG: Central Cord Syndrome at C6 of the Cervical Spinal Cord, Fall with Bilateral Closed Hip Fractures, with ORIF repair with Intramedullary Nailing of the Left Hip Fracture.   THERAPY DIAG:  Muscle weakness (generalized)  Other lack of coordination  Rationale for Evaluation and Treatment: Rehabilitation  SUBJECTIVE:  SUBJECTIVE STATEMENT: Pt reports doing well today. Pt accompanied by: self, Personal Care aide  PERTINENT HISTORY: Central Cord Syndrome  at C6 of the Cervical Spinal Cord, Fall with Bilateral Closed Hip Fractures, with ORIF repair with Intramedullary Nailing of the Left Hip Fracture.   PRECAUTIONS: None  WEIGHT BEARING RESTRICTIONS: No  PAIN:  Are you having pain? No pain, just shoulder stiffness   LIVING ENVIRONMENT: Lives with: [facility] Stairs: No Has following equipment at home: Wheelchair (power)  PLOF: Independent    PATIENT GOALS:  See below for established goals  OBJECTIVE:  Note: Objective measures were completed at Evaluation unless otherwise noted.  HAND DOMINANCE: Right  ADLs: Caregiver assist with ADLs, and [facility]  MOBILITY STATUS: Uses a power w/c  ACTIVITY TOLERANCE: Activity tolerance: Fair  FUNCTIONAL OUTCOME MEASURES:  Measurements:   12/09/2021:   Shoulder flexion: R: 120(136), L: 138(150) Shoulder abduction: R: 108(120), L: 110(120) Elbow: R: 0-148, L: 0-146 Wrist flexion: R: 55(62), L: 78 Writ extension: R: 34(55), L: 3(4) RD: R: 12(20), L: 16(26) UD: R: 8(24), L: 6(18) Thumb radial abduction: R: 11(20), L: 8(20) Digit flexion to the [facility]: R:  2nd: 5cm(3.5cm), 3rd: 7.5 cm(5cm), 4th: 7cm(3cm), 5th: 6cm(5.5cm) L: 2nd: 8cm(8cm), 3rd: 8cm(8cm), 4th: 7.5cm(7cm), 5th: 7cm(7cm)   02/03/2022:   Shoulder flexion: R: 122(138), L: 148(154) Shoulder abduction: R: 109(125), L: 125(125 Elbow: R: 0-148, L: 0-146 Wrist flexion: R: 60(68), L: 79 Writ extension: R: 40(55), L: 3(10) RD: R: 14(24), L: 20(28) UD: R: 8(24), L: 6(18) Thumb radial abduction: R: 20(25), L: 8(20) Digit flexion to the [facility]: R:  2nd: 4.5cm(3cm), 3rd: 6.5cm(4.5cm), 4th: 6.5 cm(3cm), 5th: 4.5cm(5cm) L: 2nd: 8cm(8cm), 3rd: 8cm(7cm), 4th: 7.5cm(7cm), 5th: 6.5cm(6.5cm)   04/07/2022:   Shoulder flexion: R: 125(150), L: 160(160) Shoulder abduction: R: 109(125), L: 125(125 Elbow: R: 0-148, L: 0-146 Wrist flexion: R: 60(68), L: 79 Writ extension: R: 40(55), L: 5(18) RD: R: 14(24), L: 20(28) UD: R: 18(24), L: 8(18) Thumb radial abduction: R: 20(25), L: 8(20) Digit flexion to the [facility]: R:  2nd: 4.5 cm(4 cm), 3rd: 6.5cm(3cm), 4th: 7 cm (5cm), 5th: 5cm(5cm) L: 2nd: 8cm(8cm), 3rd: 8cm(7cm), 4th: 7.5cm(7cm), 5th: 7cm(7cm)   07/05/2022:   Shoulder flexion: R: 125(154), L: 160(160) Shoulder abduction: R: 95(130), L: 143(143) Elbow: R: 0-148, L: 0-146 Wrist flexion: R: 60(68), L: 79 Writ extension: R:  40(60), L: 0(12) RD: R: 10(24), L: 12(20) UD: R: 16(24), L: 8(16) Thumb radial abduction: R: 20(25), L: 8(20) Digit flexion to the [facility]: R:  2nd: 4 cm(4cm), 3rd: 6.5cm( 3.5cm), 4th: 7 cm (5cm), 5th: 6cm(5cm) L: 2nd: 8cm(8cm), 3rd: 8cm(7cm), 4th: 8cm(7cm), 5th: 7cm(7cm)   09/13/2022:   Shoulder flexion: R: 125(154), L: 160(160) Shoulder abduction: R: 100(130), L: 143(143) Elbow: R: 0-150, L: 0-150 Wrist flexion: R: 55(68), L: 79 Writ extension: R: 45(60), L: 0(12) RD: R: 10(24), L: 12(20) UD: R: 16(24), L: 8(16) Thumb radial abduction: R: 20(25), L: 8(20) Digit flexion to the [facility]: R:  2nd: 4 cm(4cm), 3rd: 6cm( 5cm), 4th: 6 cm (4cm), 5th: 5cm(5cm) L: 2nd: 7cm(cm), 3rd: 7cm(6cm), 4th: 7cm(6cm), 5th: 6cm(6cm)   10/27/2022   Shoulder flexion: R: 128(154), L: 160(160) Shoulder abduction: R: 105(130), L: 143(143) Elbow: R: 0-150, L: 0-150 Wrist flexion: R: 55(68), L: 79 Writ extension: R: 46(64), L: 0(20) RD: R: 10(20), L: 18(24) UD: R: 18(28), L: 8(20) Thumb radial abduction: R: 30(42), L:20(24) Digit flexion to the [facility]: R:  2nd: 4 cm(3cm), 3rd: 6cm( 6cm), 4th: 6 cm (5cm), 5th: 5cm(5cm) L: 2nd: 8cm(8cm), 3rd: 8cm(6cm), 4th: 8cm(7cm), 5th: 6cm(6cm)  01/19/2023  Shoulder flexion: R: 131(154),  L: 160(160) Shoulder abduction: R: 110(130), L: 143(143) Elbow: R: 0-150, L: 0-150 Wrist flexion: R: 55(68), L: 79 Writ extension: R: 46(64), L: 0(20) RD: R: 10(20), L: 18(24) UD: R: 18(28), L: 14(20) Thumb radial abduction: R: 38(48), L:24(26) Thumb IP flexion: R: 35(50) L: 20(45) Digit flexion to the [facility]: R:  2nd: 3.5 cm(3cm), 3rd: 6cm( 6cm), 4th: 6 cm (5cm), 5th: 5cm(4.5cm) L: 2nd: 8cm(7.5cm), 3rd: 8cm(6cm), 4th: 7.5cm(6cm), 5th: 6cm(4.5cm)  04/04/2023  Shoulder flexion: R: 134(154), L: 160(160) Shoulder abduction: R: 114(130), L: 143(143) Elbow: R: WNL, L: WNL Wrist flexion: R: 55(68), L: 80 Writ extension: R: 46(64), L: -10(10) RD: R: 14(24), L: 20(24) UD: R: 20(30), L:  14(20) Thumb radial abduction: R: 38(48), L:28(30) Thumb IP flexion: R: 40(50) L: 25(45) Digit flexion to the [facility]: R:  2nd: 3.5 cm(3cm), 3rd: 6cm( 5cm), 4th: 6 cm (4cm), 5th: 5cm(5cm) L: 2nd: 8cm(7.5cm), 3rd: 8cm(7cm), 4th: 7.5cm(7cm), 5th: 7cm(6cm)   COORDINATION:   01/19/2023:  9 hole peg test:  Right: 2 pegs placed in 1 min. & 5 sec.  Pt. was able to remove 9 pegs in 17 sec.  Left: 2 pegs placed in 1 min. & 10 sec. Pt. was able to remove 9 pegs in 20 sec.  04/04/23:  Pt. was able to consistently grasp the pegs however each one slipped out of her fingers when attempting to place them into the Pegboard.  SENSATION: Intact  EDEMA: N/A   COGNITION: Overall cognitive status: WNL   VISION: Subjective report: Wears glasses, No changes in vision.  PERCEPTION: Intact  TODAY'S TREATMENT:                                                                                                                              DATE:  05/02/23  Moist heat alternated between bilat hands and R shoulder for muscle relaxation used simultaneous to passive stretching noted below Therapeutic Ex.  -Pt. tolerated PROM for bilateral wrist flex/ext, PROM for bilateral digit MP, PIP, and DIP flexion, and extension, thumb abduction.  -Pt tolerated passive stretching to promote increased range proximally at the bilat shoulders: depression, retraction, flexion, abd, ER, horiz abd/add, abd, working to increase range for overhead reach and UB ADLs.  PATIENT EDUCATION: Education details: Bilateral hand and shoulder ROM Person educated: Patient Education method: Explanation, Demonstration, and Verbal cues Education comprehension: verbalized understanding and returned demonstration  HOME EXERCISE PROGRAM: Continue to assess HEP needs, and provide as needed.   GOALS: Goals reviewed with patient? Yes  LONG TERM GOALS: Target date: 04/13/2023  3.  Patient will demonstrate improved composite finger flexion to be  able to firmly hold and use adaptive devices during ADLs, and IADLs.  Baseline: 04/04/2023: Pt. continues to work on improving Digit flexion to the [facility] to be able to efficiently handle Adaptive equipment. 01/19/2023: Pt. Is improving with Bilateral digit flexion to the [facility]. Pt. has difficulty firmly holding adaptive devices. 12/06/2022: Pt. continues to present with  increased MP, PIP, and DIP digit tightness/stiffness limiting the formulation of bilateral composites fists. 10/27/2022: Pt. presents with increased MP, PIP, and DIP digit tightness/stiffness limiting the formulation of bilateral composites fists during this progress reporting period limiting. Digit flexion to the [facility]: R:  2nd: 4 cm(3cm), 3rd: 6cm( 6cm), 4th: 6 cm (5cm), 5th: 5cm(5cm), L: 2nd: 8cm(8cm), 3rd: 8cm(6cm), 4th: 8cm(7cm), 5th: 6cm(6cm) 09/20/2022: Improving digit flexion to the [facility]. 09/13/22: improved digit flexion to the [facility]. 07/05/2022: Pt. presents with digit MP, PIP, and DIP extensor tightness limiting her ability to securely grip objects in her bilateral hands.  04/07/2022: Pt. Has improved with right 2nd, and 3rd digit flexion towards the [facility]. Pt. Continues to have difficulty securely holding and applying deodorant.02/03/2022: Pt. Has improved with digit flexion, however, continues to have difficulty securely holding and using deodorant. 12/09/2021: Bilateral hand/digit MP, PIP, and DIP extension tightness limits her ability achieve digit flexion to hold, and apply deodorant. 10/28/2021:  Pt. continues to have difficulty holding the deodorant. 01/12/2021: Pt. Presents with limited digit extension. Pt. Is able to initiate holding deodorant, however is unable to hold it while using it  Goal status: Ongoing   4.  Pt. Will improve bilateral wrist extension in preparation for anticipating, and initiating reaching for objects at the table.  Baseline: 04/04/23: R: 46(64) L: -10(10)01/19/2023:  R: 46(64) L: 0(20) 12/06/2022: Pt continues to to  progress with bilateral wrist extension in preparation fro functional reaching. 10/27/2022: Pt. Is improving with bilateral wrist extension. R: 46(64) L: 0(20) 09/20/2022: limited PROM in the Left wrist extension 2/2 tightness/stiffness. 09/13/22: R: 55(68), L: 0(12) 07/19/2022: Bilateral wrist extension in limited. 07/05/2022: Right: 40(60) Left: 0(12) 04/07/2022: Right: 40(55) Left: 5(18) 02/03/2022: Right: 34(55) Left: 3(4) 12/09/2021: Right  34(55)  Left  3(4) 10/28/2021:  Right 22(38), Left 0(15) 09/14/2021:  Right: 17(35), left 2(15)  Goal status: Left wrist tightness noted. Ongoing  7.  Pt. Will increase bilateral lateral pinch strength by 2 lbs to be able to securely grasp items during ADLs, and IADL tasks.  Baseline: 01/19/2023: Deferred as the Pinch meter is out for calibration.12/06/2022: Pt. Is able to more securely hold objects during ADLs/IADLs 10/27/2022: Pt. Is improving with holding items with her bilateral thumbs. (Pinch meter out for calibration) 09/20/2022: lateral pinch continues to be limited 09/13/22: R 6.5# L 4# 07/19/2022: TBD 07/05/2022: TBD 07/2022: NT-Pinch meter out for calibration. 02/03/2022: Right: 6#, Left: 4# 12/09/2021: Right: 6#, Left: 4#  Goal status: Deferred   9.  Pt. will complete plant care with modified independence.  Baseline:12/06/2022: Pt. Is independent with plant care in her husband's room, however is not able to access her plants due to furniture blocking access to them. 10/27/2022: Pt. Is able to water plants that are closer, and within reach. Pt. continues to have difficulty reaching for thorough plant care. 09/20/2022: Pt. Continues to have difficulty reaching for thorough plant care. 09/13/22: continues to report intermittent difficulty 07/19/2022: Pt. Continues to be able to water, and care for some of her plants. Pt. Has more difficulty with plants that are harder to reach. 07/05/2022: pt. Is able to water, and care for some of her plants. Pt. Has more difficulty with plants  that are harder to reach. 04/07/2022: Pt. is now able to hold a cup and water her plants.02/03/2022: Pt. has difficulty caring for her plants.  Goal status:Achieved   10.  Pt. will demonstrate adaptive techniques to assist with the efficiency of self-dressing, or morning care tasks.  Baseline:  04/04/2023:  Pt. continues to work on improving UE functioning to be able to efficiently use Adaptive equipment. 01/19/2203: Pt. Is able to donn her jacket in reverse independently. Pt. requires assist from staff, as Pt. Reports staff have decreased time. 12/06/2022: Pt. requires assist from staff, as Pt. Reports staff have decreased time. 10/27/2022: Pt. is able to assist with initiating UE dressing. Pt. requires assist from personal, and staff care aides. 09/20/2022: Pt. continues to require assist form personal, and staff care aides. 09/13/22: MODA dressing, reports she is often rushed 07/19/2022: Pt. continues to require assist with self-dressing/morning care tasks. 07/05/2022: Continue with goal. 04/07/2022: Pt. Continues to require assist with the efficiency of self-dressing, and morning care tasks.02/03/2022: Pt. requires assist from caregivers 2/2 time limitations during morning care.  Goal status: Ongoing  11.  Pt. will improve BUE strength to improve ADL, and IADL functioning.  Baseline: 04/04/2023: shoulder flexion L 4/5, R 3+/5; shoulder abduction L 4+/5, R 3+/5. elbow flexion B 5/5, elbow extension 4/5 01/19/2023: L 4/5, R 3+/5 12/06/2022: Continue 10/27/2022: shoulder flexion L 4/5, R 3+/5; shoulder abduction L 4+/5, R 3+/5. elbow flexion B 5/5, elbow extension 4/5 09/20/2022: Pt. Presents with limited BUE strength 09/13/22: shoulder flexion L 4/5, R 3/5; elbow flexion B 5/5, elbow extension 4-/5; shoulder abduction L 4+/5, R 3+/5. 07/19/2022: BUE strength continues to be limited. 07/05/2022: shoulder flexion: right 4-/5, abduction: 3+/5, elbow flexion: right: 5/5, left 5/5, extension: right: 4-/5, left 4-/5, wrist  extension: right: 3-/5, left: 2-/5  Goal status: Ongoing  12.  Pt. will improve bilateral thumb radial abduction in order to be able to hold the grab bars while standing with PT  Baseline: 04/04/23: R: 38(48), G:64(40) 01/19/2023: R: 38(48) L: 24(26)12/06/2022: Pt. Continues to present  with limited thumb abduction, however is improving holding onto the parallel bars.10/27/2022: thumb radial abduction: R: 30(42), L: 20(24  Goal status: Ongoing  13.  Pt. Will be able to securely hold, and stabilize medication bottles at a tabletop surface when opening, and closing them.  Baseline: 01/19/2023: Pt. Is now able to securely hold medication bottles while opening them, and stabilizes them on surfaces while opening them. 12/06/2022: Pt is improving with securely stabilizing medication bottles. 10/27/2022: Pt. Stabilizes bottles against her torso when attempting to open, and close them   Goal status: Achieved  14: Pt. Will improve bilateral thumb IP flexion to improve active grasping patterns.    Baseline: 04/04/2023:  R: 40(50) L: 25(45) 01/19/2023: Right 35(50), Left: 20/45)    Goal status: Ongoing    15. Pt. Will improve bilateral FMC/speed, and dexterity. As evidence by improved scores on the 9 hole peg test.  Baseline: 04/04/23: Pt. was able to consistently grasp the pegs however each one slipped out of her fingers when attempting to place them into the Pegboard. 01/19/2023: 2 pegs placed in 1 min. & 5 sec.  Pt. was able to remove 9 pegs in 17 sec.; Left: 2 pegs placed in 1 min. & 10 sec. Pt. was able to remove 9 pegs in 20 sec.   Goal status: Ongoing  ASSESSMENT:  CLINICAL IMPRESSION: Good tolerance to moist heat and passive stretching to bilat wrists, hands, and shoulders this date, working to increase functional range for daily tasks.  Pt. continues to present with digit MP, PIP, and DIP extensor tightness impacting her ability to formulate a full composite fist limiting gross grasping. Pt. continues  to benefit from OT services to work on impoving ROM, and UE strength  in order to work towards increasing bilateral hand grasp on objects, and increasing engagement of bilateral hands during ADLs, and IADL tasks.     PERFORMANCE DE PERFORMANCE DEFICITS: in functional skills including ADLs, IADLs, coordination, dexterity, ROM, strength, and UE functional use, cognitive skills including , and psychosocial skills including coping strategies and environmental adaptation.   IMPAIRMENTS: are limiting patient from ADLs, IADLs, and leisure.   CO-MORBIDITIES: may have co-morbidities  that affects occupational performance. Patient will benefit from skilled OT to address above impairments and improve overall function.  MODIFICATION OR ASSISTANCE TO COMPLETE EVALUATION: Min-Moderate modification of tasks or assist with assess necessary to complete an evaluation.  OT OCCUPATIONAL PROFILE AND HISTORY: Detailed assessment: Review of records and additional review of physical, cognitive, psychosocial history related to current functional performance.  CLINICAL DECISION MAKING: Moderate - several treatment options, min-mod task modification necessary  REHAB POTENTIAL: Good for stated goals  PLAN:  OT FREQUENCY 2x's a week  OT DURATION: 12 weeks  PLANNED INTERVENTIONS ADL training, A/E training, UE ther. Ex, Manual therapy, neuromuscular re-education, moist heat modality, Paraffin Bath, Splinting, and  Pt./caregiver education   RECOMMENDED OTHER SERVICES: PT  CONSULTED AND AGREED WITH PLAN OF CARE: Patient  PLAN FOR NEXT SESSION: Continue Treatment as per established POC  Danelle Earthly, MS, OTR/L   05/02/23, 4:10 PM

## 2023-05-05 ENCOUNTER — Non-Acute Institutional Stay (SKILLED_NURSING_FACILITY): Payer: Self-pay | Admitting: Nurse Practitioner

## 2023-05-05 ENCOUNTER — Encounter: Payer: Self-pay | Admitting: Nurse Practitioner

## 2023-05-05 DIAGNOSIS — K5901 Slow transit constipation: Secondary | ICD-10-CM

## 2023-05-05 DIAGNOSIS — M810 Age-related osteoporosis without current pathological fracture: Secondary | ICD-10-CM

## 2023-05-05 DIAGNOSIS — F330 Major depressive disorder, recurrent, mild: Secondary | ICD-10-CM | POA: Diagnosis not present

## 2023-05-05 DIAGNOSIS — G8250 Quadriplegia, unspecified: Secondary | ICD-10-CM

## 2023-05-05 DIAGNOSIS — I890 Lymphedema, not elsewhere classified: Secondary | ICD-10-CM

## 2023-05-05 DIAGNOSIS — I5089 Other heart failure: Secondary | ICD-10-CM

## 2023-05-05 DIAGNOSIS — D509 Iron deficiency anemia, unspecified: Secondary | ICD-10-CM

## 2023-05-05 DIAGNOSIS — M792 Neuralgia and neuritis, unspecified: Secondary | ICD-10-CM

## 2023-05-05 DIAGNOSIS — I110 Hypertensive heart disease with heart failure: Secondary | ICD-10-CM

## 2023-05-05 NOTE — Progress Notes (Signed)
 Location:  Other [place].  Nursing Home Room Number: [facility] [place] [place] [address] Place of Service:  SNF ([phone]) Abbey Chatters, NP  PCP: Earnestine Mealing, MD  Patient Care Team: Earnestine Mealing, MD as PCP - General (Family Medicine)  Extended Emergency Contact Information Primary Emergency Contact: [name],[name] Mobile Phone: [phone] Relation: Daughter Secondary Emergency Contact: Elvie, Palomo Address: [address]          [place], [place] [address] [place] of [place] Home Phone: [phone] Mobile Phone: [phone] Relation: Spouse  Goals of care: Advanced Directive information    03/09/2023   12:39 PM  Advanced Directives  Does Patient Have a Medical Advance Directive? Yes  Type of Estate agent of [place];Out of facility DNR (pink MOST or yellow form);Living will  Does patient want to make changes to medical advance directive? No - Patient declined  Copy of Healthcare Power of Attorney in Chart? Yes - validated most recent copy scanned in chart (See row information)     Chief Complaint  Patient presents with   Medical Management of Chronic Issues    Medical Management of Chronic Issues.    Discussed the use of AI scribe software for clinical note transcription with the patient, who gave verbal consent to proceed.  HPI:  Pt is a 89 y.o. female seen today for medical management of chronic disease.   She continues to undergoing physical therapy for tetraparesis, has new foot drop brace that is coming in. Due to urinary retention continues to use a Foley catheter without issues  Her mood is good, but sleep quality is variable.  Osteoporosis is managed with weekly Fosamax, along with calcium and vitamin D supplements. No complications or side effects are reported from the treatment.  She is on an iron supplement for anemia.   No worsening of neuropathy symptoms is reported. She experiences cold hands, denies any associated  pain.  Compression hose to bilateral LE are used for lymphedema, which have been beneficial.   Her appetite is good, and she reports no issues with bowel movements. Her blood pressure is stable.   Past Medical History:  Diagnosis Date   Acute blood loss anemia    Arthritis    Cancer (HCC)    skin   Central cord syndrome at C6 level of cervical spinal cord (HCC) 11/29/2017   Hypertension    Protein-calorie malnutrition, severe (HCC) 01/24/2018   S/P insertion of IVC (inferior vena caval) filter 01/24/2018   Tetraparesis (HCC)    Past Surgical History:  Procedure Laterality Date   ANTERIOR CERVICAL DECOMP/DISCECTOMY FUSION N/A 11/29/2017   Procedure: Cervical five-six, six-seven Anterior Cervical Decompression Fusion;  Surgeon: Jadene Pierini, MD;  Location: [facility] OR;  Service: Neurosurgery;  Laterality: N/A;  Cervical five-six, six-seven Anterior Cervical Decompression Fusion   CATARACT EXTRACTION     FEMUR IM NAIL Left 08/16/2021   Procedure: INTRAMEDULLARY (IM) NAIL FEMORAL;  Surgeon: Deeann Saint, MD;  Location: [facility] ORS;  Service: Orthopedics;  Laterality: Left;   IR IVC FILTER PLMT / S&I /IMG GUID/MOD SED  12/08/2017    Allergies  Allergen Reactions   Other Swelling   Lisinopril-Hydrochlorothiazide Swelling    Swelling of the tongue   Beef-Derived Drug Products Swelling    Facial and lip swelling (due to tick bite)   Dairy Aid [Tilactase] Swelling   Lisinopril-Hydrochlorothiazide Swelling    Tongue swelling   Pork-Derived Products Swelling    Outpatient Encounter Medications as of 05/05/2023  Medication Sig   acetaminophen (  TYLENOL) 500 MG tablet Take 500 mg by mouth every 12 (twelve) hours as needed for mild pain.   acetaminophen (TYLENOL) 650 MG CR tablet Take 650 mg by mouth every 8 (eight) hours as needed for pain.   acetic acid 0.25 % irrigation Use 30ml via irrigation twice daily to keep foley Cath patent.   alendronate (FOSAMAX) 70 MG tablet Take 70 mg by  mouth every Saturday.   calcium carbonate (CALCIUM 600) 600 MG TABS tablet Take 600 mg by mouth at bedtime.   Capsaicin 0.1 % CREA Apply 1 application  topically every evening. (Apply to hands)   cetirizine (ZYRTEC) 5 MG tablet Take 5 mg by mouth 2 (two) times daily.   cholecalciferol (VITAMIN D3) 25 MCG (1000 UNIT) tablet Take 1,000 Units by mouth daily.   DULoxetine (CYMBALTA) 60 MG capsule Take 60 mg by mouth daily.   ferrous sulfate 325 (65 FE) MG tablet Take 325 mg by mouth daily with breakfast.   fluocinonide (LIDEX) 0.05 % external solution Apply 1 Application topically every 12 (twelve) hours as needed.   gabapentin (NEURONTIN) 100 MG capsule Take 1 capsule (100 mg total) by mouth daily.   hydroxypropyl methylcellulose / hypromellose (ISOPTO TEARS / GONIOVISC) 2.5 % ophthalmic solution Place 1 drop into both eyes every hour as needed for dry eyes.   Infant Care Products [facility]) OINT Apply to buttocks/gluteal folds topically every shift.   lactulose, encephalopathy, (CHRONULAC) 10 GM/15ML SOLN Take 30 mLs by mouth daily as needed (mild constipation).   magnesium citrate solution Take 60-120 mLs by mouth daily as needed (constipation).   methylcellulose oral powder Take 1 packet by mouth at bedtime.   mometasone (ELOCON) 0.1 % lotion Apply 1 Application topically every Saturday at 6 PM. (Apply to the ear canal for eczema)   Multiple Vitamin (MULTIVITAMIN WITH MINERALS) TABS tablet Take 1 tablet by mouth daily.   olopatadine (PATADAY) 0.1 % ophthalmic solution Place 1 drop into both eyes 2 (two) times daily.   polyethylene glycol (MIRALAX / GLYCOLAX) 17 g packet Take 17 g by mouth 2 (two) times daily.   rivaroxaban (XARELTO) 10 MG TABS tablet Take 10 mg by mouth daily.   saccharomyces boulardii (FLORASTOR) 250 MG capsule Take 250 mg by mouth daily.   Saline Spray 0.2 % SOLN Place 2 sprays into both nostrils as needed (nasal congestion).   simethicone (MYLICON) 80 MG chewable tablet  Chew 80 mg by mouth every 6 (six) hours as needed for flatulence.   sodium phosphate (FLEET) 7-19 GM/118ML ENEM Place 1 enema rectally every other day as needed for severe constipation.   spironolactone (ALDACTONE) 25 MG tablet Take 0.5 tablets (12.5 mg total) by mouth daily.   tiZANidine (ZANAFLEX) 2 MG tablet Take 1 tablet (2 mg total) by mouth 2 (two) times daily.   triamcinolone cream (KENALOG) 0.1 % Apply to thigh and lower legs topically every 8 hours as needed. Apply to back of neck topically as needed.   Zinc Oxide (TRIPLE PASTE) 12.8 % ointment Apply 1 Application topically. Every shift.   No facility-administered encounter medications on file as of 05/05/2023.    Review of Systems  Constitutional:  Negative for activity change, appetite change, fatigue and unexpected weight change.  HENT:  Negative for congestion and hearing loss.   Eyes: Negative.   Respiratory:  Negative for cough and shortness of breath.   Cardiovascular:  Negative for chest pain, palpitations and leg swelling.  Gastrointestinal:  Negative for abdominal pain, constipation  and diarrhea.  Genitourinary:  Negative for difficulty urinating and dysuria.  Musculoskeletal:  Negative for arthralgias and myalgias.  Skin:  Negative for color change and wound.  Neurological:  Positive for weakness. Negative for dizziness.  Psychiatric/Behavioral:  Negative for agitation, behavioral problems and confusion.      Immunization History  Administered Date(s) Administered   Influenza, High Dose Seasonal PF 11/17/2021   Influenza-Unspecified 11/14/2018, 11/20/2019, 11/17/2020, 11/17/2021, 11/24/2022   Moderna Covid-19 Fall Seasonal Vaccine 22yrs & older 05/11/2022   Moderna Covid-19 Vaccine Bivalent Booster 27yrs & up 06/30/2021, 12/11/2021, 10/29/2022   Moderna Sars-Covid-2 Vaccination 02/09/2019, 03/09/2019, 12/14/2019, 06/20/2020, 10/24/2020   PPD Test 05/16/2019   Tdap 11/29/2017   Zoster Recombinant(Shingrix)  09/02/2022, 12/04/2022   Pertinent  Health Maintenance Due  Topic Date Due   INFLUENZA VACCINE  09/02/2023   DEXA SCAN  Discontinued      08/19/2021   10:00 AM 08/19/2021    8:54 PM 08/20/2021    8:20 AM 03/29/2022   10:11 AM 08/31/2022    2:42 PM  Fall Risk  Falls in the past year?    0 0  Was there an injury with Fall?    0   Fall Risk Category Calculator    0   (RETIRED) Patient Fall Risk Level High fall risk High fall risk High fall risk    Patient at Risk for Falls Due to    No Fall Risks    Functional Status Survey:    Vitals:   05/05/23 0850  BP: 139/81  Pulse: 69  Resp: 18  Temp: (!) 97.4 F (36.3 C)  SpO2: 91%  Weight: 181 lb 9.6 oz (82.4 kg)  Height: 5\' 7"  (1.702 m)   Body mass index is 28.44 kg/m. Physical Exam Constitutional:      General: She is not in acute distress.    Appearance: She is well-developed. She is not diaphoretic.  HENT:     Head: Normocephalic and atraumatic.     Mouth/Throat:     Pharynx: No oropharyngeal exudate.  Eyes:     Conjunctiva/sclera: Conjunctivae normal.     Pupils: Pupils are equal, round, and reactive to light.  Cardiovascular:     Rate and Rhythm: Normal rate and regular rhythm.     Heart sounds: Normal heart sounds.  Pulmonary:     Effort: Pulmonary effort is normal.     Breath sounds: Normal breath sounds.  Abdominal:     General: Bowel sounds are normal.     Palpations: Abdomen is soft.  Musculoskeletal:     Cervical back: Normal range of motion and neck supple.     Right lower leg: No edema.     Left lower leg: No edema.  Skin:    General: Skin is warm and dry.  Neurological:     Mental Status: She is alert and oriented to person, place, and time.     Motor: Weakness present.     Gait: Gait abnormal.  Psychiatric:        Mood and Affect: Mood normal.     Labs reviewed: Recent Labs    06/02/22 0000 09/29/22 0000 11/04/22 0000  NA 140 139 139  K 4.6 3.8 3.8  CL 102 100 100  CO2 31* 29* 32*  BUN  13 10 9   CREATININE 0.5 0.5 0.5  CALCIUM 9.0 8.6* 8.8   Recent Labs    09/29/22 0000  AST 20  ALT 14  ALKPHOS 58  ALBUMIN 3.6  Recent Labs    05/10/22 0000 05/17/22 0000 09/30/22 0000  WBC  --   --  4.7  NEUTROABS  --   --  2,627.00  HGB 10.0* 10.7* 13.2  HCT 31* 33* 41  PLT  --   --  280   Lab Results  Component Value Date   TSH 1.31 09/29/2022   No results found for: "HGBA1C" No results found for: "CHOL", "HDL", "LDLCALC", "LDLDIRECT", "TRIG", "CHOLHDL"  Significant Diagnostic Results in last 30 days:  No results found.  Assessment/Plan Tetraparesis Chronic tetraparesis with ongoing physical therapy. No associated pain. - Continue physical therapy. - will get foot drop brace on the 19th  Neuropathy Chronic neuropathy with persistent cold sensation in hands, ?nerve damage. - Continue current gabapentin daily.  Lymphedema Lymphedema managed with compression garments, with reported improvement. - Continue use of compression garments.  Osteoporosis Osteoporosis managed with weekly Fosamax, calcium, and vitamin D supplementation. No new fractures or complications. - Continue Fosamax, calcium, and vitamin D supplementation.  Anemia Anemia managed with iron supplementation. Last labs in August were satisfactory. Due for repeat labs. - Order repeat labs for anemia monitoring.  Hypertensive heart disease with other congestive heart failure (HCC) Controlled on current regimen   Mild recurrent major depression (HCC) Stable on cymbalta  constipation due to slow transit Controlled on current regimen    Haifa Hatton K. Biagio Borg [facility] [phone]

## 2023-05-09 ENCOUNTER — Ambulatory Visit: Payer: Medicare HMO | Admitting: Occupational Therapy

## 2023-05-09 ENCOUNTER — Ambulatory Visit: Payer: Medicare HMO

## 2023-05-09 LAB — COMPREHENSIVE METABOLIC PANEL WITH GFR
Albumin: 3.6 (ref 3.5–5.0)
Calcium: 9.3 (ref 8.7–10.7)
Globulin: 1.9
eGFR: 88

## 2023-05-09 LAB — HEPATIC FUNCTION PANEL
ALT: 16 U/L (ref 7–35)
AST: 21 (ref 13–35)
Alkaline Phosphatase: 59 (ref 25–125)
Bilirubin, Total: 0.5

## 2023-05-09 LAB — CBC AND DIFFERENTIAL
HCT: 38 (ref 36–46)
Hemoglobin: 12 (ref 12.0–16.0)
Neutrophils Absolute: 2272
Platelets: 296 10*3/uL (ref 150–400)
WBC: 4.6

## 2023-05-09 LAB — BASIC METABOLIC PANEL WITH GFR
BUN: 11 (ref 4–21)
CO2: 33 — AB (ref 13–22)
Chloride: 101 (ref 99–108)
Creatinine: 0.6 (ref 0.5–1.1)
Glucose: 85
Potassium: 5.1 meq/L (ref 3.5–5.1)
Sodium: 140 (ref 137–147)

## 2023-05-09 LAB — CBC: RBC: 3.96 (ref 3.87–5.11)

## 2023-05-11 ENCOUNTER — Ambulatory Visit: Payer: Medicare HMO | Attending: Internal Medicine | Admitting: Occupational Therapy

## 2023-05-11 ENCOUNTER — Ambulatory Visit: Payer: Medicare HMO

## 2023-05-11 DIAGNOSIS — R2689 Other abnormalities of gait and mobility: Secondary | ICD-10-CM

## 2023-05-11 DIAGNOSIS — M542 Cervicalgia: Secondary | ICD-10-CM

## 2023-05-11 DIAGNOSIS — R296 Repeated falls: Secondary | ICD-10-CM | POA: Diagnosis present

## 2023-05-11 DIAGNOSIS — R2681 Unsteadiness on feet: Secondary | ICD-10-CM

## 2023-05-11 DIAGNOSIS — S14129S Central cord syndrome at unspecified level of cervical spinal cord, sequela: Secondary | ICD-10-CM | POA: Diagnosis present

## 2023-05-11 DIAGNOSIS — R269 Unspecified abnormalities of gait and mobility: Secondary | ICD-10-CM | POA: Insufficient documentation

## 2023-05-11 DIAGNOSIS — S72001A Fracture of unspecified part of neck of right femur, initial encounter for closed fracture: Secondary | ICD-10-CM

## 2023-05-11 DIAGNOSIS — M6281 Muscle weakness (generalized): Secondary | ICD-10-CM | POA: Insufficient documentation

## 2023-05-11 DIAGNOSIS — R278 Other lack of coordination: Secondary | ICD-10-CM | POA: Insufficient documentation

## 2023-05-11 DIAGNOSIS — R262 Difficulty in walking, not elsewhere classified: Secondary | ICD-10-CM

## 2023-05-11 DIAGNOSIS — S72002A Fracture of unspecified part of neck of left femur, initial encounter for closed fracture: Secondary | ICD-10-CM | POA: Insufficient documentation

## 2023-05-11 NOTE — Therapy (Signed)
 Occupational Therapy Neuro Treatment Note   Patient Name: Sherry [name] MRN: [id number] DOB:May 28, 1934, 89 y.o., female Today's Date: 11/29/2022  PCP:  Cindi Carbon PROVIDER: Earnestine Mealing, MD  END OF SESSION:   OT End of Session - 05/11/23 1338     Visit Number [id number]    Number of Visits 192    Date for OT Re-Evaluation 06/27/23    Authorization Time Period Progress report period starting 04/04/23    OT Start Time 1145    OT Stop Time 1235    OT Time Calculation (min) 50 min    Equipment Utilized During Treatment powered wheelchair    Activity Tolerance Patient tolerated treatment well    Behavior During Therapy WFL for tasks assessed/performed                     Past Medical History:  Diagnosis Date   Acute blood loss anemia    Arthritis    Cancer (HCC)    skin   Central cord syndrome at C6 level of cervical spinal cord (HCC) 11/29/2017   Hypertension    Protein-calorie malnutrition, severe (HCC) 01/24/2018   S/P insertion of IVC (inferior vena caval) filter 01/24/2018   Tetraparesis (HCC)    Past Surgical History:  Procedure Laterality Date   ANTERIOR CERVICAL DECOMP/DISCECTOMY FUSION N/A 11/29/2017   Procedure: Cervical five-six, six-seven Anterior Cervical Decompression Fusion;  Surgeon: Jadene Pierini, MD;  Location: [facility] OR;  Service: Neurosurgery;  Laterality: N/A;  Cervical five-six, six-seven Anterior Cervical Decompression Fusion   CATARACT EXTRACTION     FEMUR IM NAIL Left 08/16/2021   Procedure: INTRAMEDULLARY (IM) NAIL FEMORAL;  Surgeon: Deeann Saint, MD;  Location: [facility] ORS;  Service: Orthopedics;  Laterality: Left;   IR IVC FILTER PLMT / S&I /IMG GUID/MOD SED  12/08/2017   Patient Active Problem List   Diagnosis Date Noted   Age-related osteoporosis without current pathological fracture 07/27/2022   Mild recurrent major depression (HCC) 07/27/2022   Hypertensive heart disease with other congestive heart failure (HCC) 02/14/2022    Chronic indwelling Foley catheter 02/14/2022   Closed hip fracture (HCC) 08/15/2021   DVT (deep venous thrombosis) (HCC) 08/15/2021   Quadriplegia (HCC) 08/15/2021   Fall 08/15/2021   Constipation due to slow transit 08/31/2018   Trauma 06/05/2018   Neuropathic pain 06/05/2018   Neurogenic bowel 06/05/2018   Vaginal yeast infection 01/30/2018   Healthcare-associated pneumonia 01/25/2018   Chronic allergic rhinitis 01/24/2018   Depression with anxiety 01/24/2018   UTI due to Klebsiella species 01/24/2018   Protein-calorie malnutrition, severe (HCC) 01/24/2018   S/P insertion of IVC (inferior vena caval) filter 01/24/2018   Tetraparesis (HCC) 01/20/2018   Neurogenic bladder 01/20/2018   Reactive depression    Benign essential HTN    Acute postoperative anemia due to expected blood loss    Central cord syndrome at C6 level of cervical spinal cord (HCC) 11/29/2017   Allergy to alpha-gal 11/25/2016   SCC (squamous cell carcinoma) 04/08/2014   ONSET DATE: 11/29/2017  REFERRING DIAG: Central Cord Syndrome at C6 of the Cervical Spinal Cord, Fall with Bilateral Closed Hip Fractures, with ORIF repair with Intramedullary Nailing of the Left Hip Fracture.   THERAPY DIAG:  Muscle weakness (generalized)  Other lack of coordination  Rationale for Evaluation and Treatment: Rehabilitation  SUBJECTIVE:  SUBJECTIVE STATEMENT:  Pt. report doing well today. Pt accompanied by: self, Personal Care aide  PERTINENT HISTORY: Central Cord Syndrome at C6 of the Cervical Spinal Cord, Fall  with Bilateral Closed Hip Fractures, with ORIF repair with Intramedullary Nailing of the Left Hip Fracture.   PRECAUTIONS: None  WEIGHT BEARING RESTRICTIONS: No  PAIN:  Are you having pain? No pain, just shoulder stiffness   LIVING ENVIRONMENT: Lives with: [facility] Stairs: No Has following equipment at home: Wheelchair (power)  PLOF: Independent   PATIENT GOALS:  See below for  established goals  OBJECTIVE:  Note: Objective measures were completed at Evaluation unless otherwise noted.  HAND DOMINANCE: Right  ADLs: Caregiver assist with ADLs, and [facility]  MOBILITY STATUS: Uses a power w/c  ACTIVITY TOLERANCE: Activity tolerance: Fair  FUNCTIONAL OUTCOME MEASURES:  Measurements:   12/09/2021:   Shoulder flexion: R: 120(136), L: 138(150) Shoulder abduction: R: 108(120), L: 110(120) Elbow: R: 0-148, L: 0-146 Wrist flexion: R: 55(62), L: 78 Writ extension: R: 34(55), L: 3(4) RD: R: 12(20), L: 16(26) UD: R: 8(24), L: 6(18) Thumb radial abduction: R: 11(20), L: 8(20) Digit flexion to the [facility]: R:  2nd: 5cm(3.5cm), 3rd: 7.5 cm(5cm), 4th: 7cm(3cm), 5th: 6cm(5.5cm) L: 2nd: 8cm(8cm), 3rd: 8cm(8cm), 4th: 7.5cm(7cm), 5th: 7cm(7cm)   02/03/2022:   Shoulder flexion: R: 122(138), L: 148(154) Shoulder abduction: R: 109(125), L: 125(125 Elbow: R: 0-148, L: 0-146 Wrist flexion: R: 60(68), L: 79 Writ extension: R: 40(55), L: 3(10) RD: R: 14(24), L: 20(28) UD: R: 8(24), L: 6(18) Thumb radial abduction: R: 20(25), L: 8(20) Digit flexion to the [facility]: R:  2nd: 4.5cm(3cm), 3rd: 6.5cm(4.5cm), 4th: 6.5 cm(3cm), 5th: 4.5cm(5cm) L: 2nd: 8cm(8cm), 3rd: 8cm(7cm), 4th: 7.5cm(7cm), 5th: 6.5cm(6.5cm)   04/07/2022:   Shoulder flexion: R: 125(150), L: 160(160) Shoulder abduction: R: 109(125), L: 125(125 Elbow: R: 0-148, L: 0-146 Wrist flexion: R: 60(68), L: 79 Writ extension: R: 40(55), L: 5(18) RD: R: 14(24), L: 20(28) UD: R: 18(24), L: 8(18) Thumb radial abduction: R: 20(25), L: 8(20) Digit flexion to the [facility]: R:  2nd: 4.5 cm(4 cm), 3rd: 6.5cm(3cm), 4th: 7 cm (5cm), 5th: 5cm(5cm) L: 2nd: 8cm(8cm), 3rd: 8cm(7cm), 4th: 7.5cm(7cm), 5th: 7cm(7cm)   07/05/2022:   Shoulder flexion: R: 125(154), L: 160(160) Shoulder abduction: R: 95(130), L: 143(143) Elbow: R: 0-148, L: 0-146 Wrist flexion: R: 60(68), L: 79 Writ extension: R: 40(60), L: 0(12) RD: R: 10(24), L:  12(20) UD: R: 16(24), L: 8(16) Thumb radial abduction: R: 20(25), L: 8(20) Digit flexion to the [facility]: R:  2nd: 4 cm(4cm), 3rd: 6.5cm( 3.5cm), 4th: 7 cm (5cm), 5th: 6cm(5cm) L: 2nd: 8cm(8cm), 3rd: 8cm(7cm), 4th: 8cm(7cm), 5th: 7cm(7cm)   09/13/2022:   Shoulder flexion: R: 125(154), L: 160(160) Shoulder abduction: R: 100(130), L: 143(143) Elbow: R: 0-150, L: 0-150 Wrist flexion: R: 55(68), L: 79 Writ extension: R: 45(60), L: 0(12) RD: R: 10(24), L: 12(20) UD: R: 16(24), L: 8(16) Thumb radial abduction: R: 20(25), L: 8(20) Digit flexion to the [facility]: R:  2nd: 4 cm(4cm), 3rd: 6cm( 5cm), 4th: 6 cm (4cm), 5th: 5cm(5cm) L: 2nd: 7cm(cm), 3rd: 7cm(6cm), 4th: 7cm(6cm), 5th: 6cm(6cm)   10/27/2022   Shoulder flexion: R: 128(154), L: 160(160) Shoulder abduction: R: 105(130), L: 143(143) Elbow: R: 0-150, L: 0-150 Wrist flexion: R: 55(68), L: 79 Writ extension: R: 46(64), L: 0(20) RD: R: 10(20), L: 18(24) UD: R: 18(28), L: 8(20) Thumb radial abduction: R: 30(42), L:20(24) Digit flexion to the [facility]: R:  2nd: 4 cm(3cm), 3rd: 6cm( 6cm), 4th: 6 cm (5cm), 5th: 5cm(5cm) L: 2nd: 8cm(8cm), 3rd: 8cm(6cm), 4th: 8cm(7cm), 5th: 6cm(6cm)  01/19/2023  Shoulder flexion: R: 131(154), L: 160(160) Shoulder abduction: R: 110(130), L: 143(143)  Elbow: R: 0-150, L: 0-150 Wrist flexion: R: 55(68), L: 79 Writ extension: R: 46(64), L: 0(20) RD: R: 10(20), L: 18(24) UD: R: 18(28), L: 14(20) Thumb radial abduction: R: 38(48), L:24(26) Thumb IP flexion: R: 35(50) L: 20(45) Digit flexion to the [facility]: R:  2nd: 3.5 cm(3cm), 3rd: 6cm( 6cm), 4th: 6 cm (5cm), 5th: 5cm(4.5cm) L: 2nd: 8cm(7.5cm), 3rd: 8cm(6cm), 4th: 7.5cm(6cm), 5th: 6cm(4.5cm)  04/04/2023  Shoulder flexion: R: 134(154), L: 160(160) Shoulder abduction: R: 114(130), L: 143(143) Elbow: R: WNL, L: WNL Wrist flexion: R: 55(68), L: 80 Writ extension: R: 46(64), L: -10(10) RD: R: 14(24), L: 20(24) UD: R: 20(30), L: 14(20) Thumb radial abduction: R: 38(48),  L:28(30) Thumb IP flexion: R: 40(50) L: 25(45) Digit flexion to the [facility]: R:  2nd: 3.5 cm(3cm), 3rd: 6cm( 5cm), 4th: 6 cm (4cm), 5th: 5cm(5cm) L: 2nd: 8cm(7.5cm), 3rd: 8cm(7cm), 4th: 7.5cm(7cm), 5th: 7cm(6cm)   COORDINATION:   01/19/2023:  9 hole peg test:  Right: 2 pegs placed in 1 min. & 5 sec.  Pt. was able to remove 9 pegs in 17 sec.  Left: 2 pegs placed in 1 min. & 10 sec. Pt. was able to remove 9 pegs in 20 sec.  04/04/23:  Pt. was able to consistently grasp the pegs however each one slipped out of her fingers when attempting to place them into the Pegboard.  SENSATION: Intact  EDEMA: N/A   COGNITION: Overall cognitive status: WNL   VISION: Subjective report: Wears glasses, No changes in vision.  PERCEPTION: Intact  TODAY'S TREATMENT:                                                                                                                              DATE:  05/11/23   Moist heat alternated between bilat hands and R shoulder for muscle relaxation used simultaneous to passive stretching noted below  Manual Therapy:   Pt. tolerated soft tissue massage to the volar, and dorsal surface of each digit on the bilateral hands, digit lateral, and sagittal bands 2/2 to stiffness following moist heat modality. Soft tissue mobilization was performed for carpal, and metacarpal spread stretches. Manual therapy was performed independent of, and in preparation for therapeutic Ex.     Therapeutic Ex.    -Pt. tolerated PROM followed by AROM bilateral wrist extension, PROM for bilateral digit MP, PIP, and DIP flexion, and extension, thumb abduction.    PATIENT EDUCATION: Education details: Bilateral hand and shoulder ROM Person educated: Patient Education method: Explanation, Demonstration, and Verbal cues Education comprehension: verbalized understanding and returned demonstration  HOME EXERCISE PROGRAM: Continue to assess HEP needs, and provide as needed.   GOALS: Goals  reviewed with patient? Yes  LONG TERM GOALS: Target date: 04/13/2023  3.  Patient will demonstrate improved composite finger flexion to be able to firmly hold and use adaptive devices during ADLs, and IADLs.  Baseline: 04/04/2023: Pt. continues to work on improving Digit flexion to the [facility] to be able  to efficiently handle Adaptive equipment. 01/19/2023: Pt. Is improving with Bilateral digit flexion to the [facility]. Pt. has difficulty firmly holding adaptive devices. 12/06/2022: Pt. continues to present with increased MP, PIP, and DIP digit tightness/stiffness limiting the formulation of bilateral composites fists. 10/27/2022: Pt. presents with increased MP, PIP, and DIP digit tightness/stiffness limiting the formulation of bilateral composites fists during this progress reporting period limiting. Digit flexion to the [facility]: R:  2nd: 4 cm(3cm), 3rd: 6cm( 6cm), 4th: 6 cm (5cm), 5th: 5cm(5cm), L: 2nd: 8cm(8cm), 3rd: 8cm(6cm), 4th: 8cm(7cm), 5th: 6cm(6cm) 09/20/2022: Improving digit flexion to the [facility]. 09/13/22: improved digit flexion to the [facility]. 07/05/2022: Pt. presents with digit MP, PIP, and DIP extensor tightness limiting her ability to securely grip objects in her bilateral hands.  04/07/2022: Pt. Has improved with right 2nd, and 3rd digit flexion towards the [facility]. Pt. Continues to have difficulty securely holding and applying deodorant.02/03/2022: Pt. Has improved with digit flexion, however, continues to have difficulty securely holding and using deodorant. 12/09/2021: Bilateral hand/digit MP, PIP, and DIP extension tightness limits her ability achieve digit flexion to hold, and apply deodorant. 10/28/2021:  Pt. continues to have difficulty holding the deodorant. 01/12/2021: Pt. Presents with limited digit extension. Pt. Is able to initiate holding deodorant, however is unable to hold it while using it  Goal status: Ongoing   4.  Pt. Will improve bilateral wrist extension in preparation for anticipating, and initiating  reaching for objects at the table.  Baseline: 04/04/23: R: 46(64) L: -10(10)01/19/2023:  R: 46(64) L: 0(20) 12/06/2022: Pt continues to to progress with bilateral wrist extension in preparation fro functional reaching. 10/27/2022: Pt. Is improving with bilateral wrist extension. R: 46(64) L: 0(20) 09/20/2022: limited PROM in the Left wrist extension 2/2 tightness/stiffness. 09/13/22: R: 55(68), L: 0(12) 07/19/2022: Bilateral wrist extension in limited. 07/05/2022: Right: 40(60) Left: 0(12) 04/07/2022: Right: 40(55) Left: 5(18) 02/03/2022: Right: 34(55) Left: 3(4) 12/09/2021: Right  34(55)  Left  3(4) 10/28/2021:  Right 22(38), Left 0(15) 09/14/2021:  Right: 17(35), left 2(15)  Goal status: Left wrist tightness noted. Ongoing  7.  Pt. Will increase bilateral lateral pinch strength by 2 lbs to be able to securely grasp items during ADLs, and IADL tasks.  Baseline: 01/19/2023: Deferred as the Pinch meter is out for calibration.12/06/2022: Pt. Is able to more securely hold objects during ADLs/IADLs 10/27/2022: Pt. Is improving with holding items with her bilateral thumbs. (Pinch meter out for calibration) 09/20/2022: lateral pinch continues to be limited 09/13/22: R 6.5# L 4# 07/19/2022: TBD 07/05/2022: TBD 07/2022: NT-Pinch meter out for calibration. 02/03/2022: Right: 6#, Left: 4# 12/09/2021: Right: 6#, Left: 4#  Goal status: Deferred   9.  Pt. will complete plant care with modified independence.  Baseline:12/06/2022: Pt. Is independent with plant care in her husband's room, however is not able to access her plants due to furniture blocking access to them. 10/27/2022: Pt. Is able to water plants that are closer, and within reach. Pt. continues to have difficulty reaching for thorough plant care. 09/20/2022: Pt. Continues to have difficulty reaching for thorough plant care. 09/13/22: continues to report intermittent difficulty 07/19/2022: Pt. Continues to be able to water, and care for some of her plants. Pt. Has more difficulty  with plants that are harder to reach. 07/05/2022: pt. Is able to water, and care for some of her plants. Pt. Has more difficulty with plants that are harder to reach. 04/07/2022: Pt. is now able to hold a cup and water her plants.02/03/2022: Pt. has difficulty  caring for her plants.  Goal status:Achieved   10.  Pt. will demonstrate adaptive techniques to assist with the efficiency of self-dressing, or morning care tasks.  Baseline: 04/04/2023:  Pt. continues to work on improving UE functioning to be able to efficiently use Adaptive equipment. 01/19/2203: Pt. Is able to donn her jacket in reverse independently. Pt. requires assist from staff, as Pt. Reports staff have decreased time. 12/06/2022: Pt. requires assist from staff, as Pt. Reports staff have decreased time. 10/27/2022: Pt. is able to assist with initiating UE dressing. Pt. requires assist from personal, and staff care aides. 09/20/2022: Pt. continues to require assist form personal, and staff care aides. 09/13/22: MODA dressing, reports she is often rushed 07/19/2022: Pt. continues to require assist with self-dressing/morning care tasks. 07/05/2022: Continue with goal. 04/07/2022: Pt. Continues to require assist with the efficiency of self-dressing, and morning care tasks.02/03/2022: Pt. requires assist from caregivers 2/2 time limitations during morning care.  Goal status: Ongoing  11.  Pt. will improve BUE strength to improve ADL, and IADL functioning.  Baseline: 04/04/2023: shoulder flexion L 4/5, R 3+/5; shoulder abduction L 4+/5, R 3+/5. elbow flexion B 5/5, elbow extension 4/5 01/19/2023: L 4/5, R 3+/5 12/06/2022: Continue 10/27/2022: shoulder flexion L 4/5, R 3+/5; shoulder abduction L 4+/5, R 3+/5. elbow flexion B 5/5, elbow extension 4/5 09/20/2022: Pt. Presents with limited BUE strength 09/13/22: shoulder flexion L 4/5, R 3/5; elbow flexion B 5/5, elbow extension 4-/5; shoulder abduction L 4+/5, R 3+/5. 07/19/2022: BUE strength continues to be limited.  07/05/2022: shoulder flexion: right 4-/5, abduction: 3+/5, elbow flexion: right: 5/5, left 5/5, extension: right: 4-/5, left 4-/5, wrist extension: right: 3-/5, left: 2-/5  Goal status: Ongoing  12.  Pt. will improve bilateral thumb radial abduction in order to be able to hold the grab bars while standing with PT  Baseline: 04/04/23: R: 38(48), Z:61(09) 01/19/2023: R: 38(48) L: 24(26)12/06/2022: Pt. Continues to present  with limited thumb abduction, however is improving holding onto the parallel bars.10/27/2022: thumb radial abduction: R: 30(42), L: 20(24  Goal status: Ongoing  13.  Pt. Will be able to securely hold, and stabilize medication bottles at a tabletop surface when opening, and closing them.  Baseline: 01/19/2023: Pt. Is now able to securely hold medication bottles while opening them, and stabilizes them on surfaces while opening them. 12/06/2022: Pt is improving with securely stabilizing medication bottles. 10/27/2022: Pt. Stabilizes bottles against her torso when attempting to open, and close them   Goal status: Achieved  14: Pt. Will improve bilateral thumb IP flexion to improve active grasping patterns.    Baseline: 04/04/2023:  R: 40(50) L: 25(45) 01/19/2023: Right 35(50), Left: 20/45)    Goal status: Ongoing    15. Pt. Will improve bilateral FMC/speed, and dexterity. As evidence by improved scores on the 9 hole peg test.  Baseline: 04/04/23: Pt. was able to consistently grasp the pegs however each one slipped out of her fingers when attempting to place them into the Pegboard. 01/19/2023: 2 pegs placed in 1 min. & 5 sec.  Pt. was able to remove 9 pegs in 17 sec.; Left: 2 pegs placed in 1 min. & 10 sec. Pt. was able to remove 9 pegs in 20 sec.   Goal status: Ongoing  ASSESSMENT:  CLINICAL IMPRESSION:  Pt. tolerated moist heat, manual therapy, ROM, and the UBE well today. Pt. continues to present with digit MP, PIP, and DIP extensor tightness impacting her ability to formulate a full  composite fist limiting  gross grasping. Pt. continues to benefit from OT services to work on impoving ROM, and UE strength in order to work towards increasing bilateral hand grasp on objects, and increasing engagement of bilateral hands during ADLs, and IADL tasks.     PERFORMANCE DE PERFORMANCE DEFICITS: in functional skills including ADLs, IADLs, coordination, dexterity, ROM, strength, and UE functional use, cognitive skills including , and psychosocial skills including coping strategies and environmental adaptation.   IMPAIRMENTS: are limiting patient from ADLs, IADLs, and leisure.   CO-MORBIDITIES: may have co-morbidities  that affects occupational performance. Patient will benefit from skilled OT to address above impairments and improve overall function.  MODIFICATION OR ASSISTANCE TO COMPLETE EVALUATION: Min-Moderate modification of tasks or assist with assess necessary to complete an evaluation.  OT OCCUPATIONAL PROFILE AND HISTORY: Detailed assessment: Review of records and additional review of physical, cognitive, psychosocial history related to current functional performance.  CLINICAL DECISION MAKING: Moderate - several treatment options, min-mod task modification necessary  REHAB POTENTIAL: Good for stated goals  PLAN:  OT FREQUENCY 2x's a week  OT DURATION: 12 weeks  PLANNED INTERVENTIONS ADL training, A/E training, UE ther. Ex, Manual therapy, neuromuscular re-education, moist heat modality, Paraffin Bath, Splinting, and  Pt./caregiver education   RECOMMENDED OTHER SERVICES: PT  CONSULTED AND AGREED WITH PLAN OF CARE: Patient  PLAN FOR NEXT SESSION: Continue Treatment as per established POC  Olegario Messier, MS, OTR/L   05/11/23, 1:40 PM

## 2023-05-12 NOTE — Therapy (Signed)
 OUTPATIENT PHYSICAL THERAPY NEURO TREATMENT    Patient Name: Sherry [name] MRN: [id number] DOB:05-23-1934, 89 y.o., female Today's Date: 05/12/2023   PCP: Earnestine Mealing, MD REFERRING PROVIDER: Deeann Saint   PT End of Session - 05/11/23 1109     Visit Number 109    Number of Visits 124    Date for PT Re-Evaluation 06/20/23    Authorization Type [employer] FOTO performed by PT on eval (7/20), score 12, Progress note on 03/10/2020; PN on 08/20/2020    Authorization Time Period Initial cert [id number]/82/9562-13/[id number]/6576; Recert 01/11/2022-04/05/2022; Recert 04/07/2022-06/30/2022    Progress Note Due on Visit 110    PT Start Time 1103    PT Stop Time 1145    PT Time Calculation (min) 42 min    Equipment Utilized During Treatment Gait belt    Activity Tolerance Patient tolerated treatment well    Behavior During Therapy WFL for tasks assessed/performed                      Past Medical History:  Diagnosis Date   Acute blood loss anemia    Arthritis    Cancer (HCC)    skin   Central cord syndrome at C6 level of cervical spinal cord (HCC) 11/29/2017   Hypertension    Protein-calorie malnutrition, severe (HCC) 01/24/2018   S/P insertion of IVC (inferior vena caval) filter 01/24/2018   Tetraparesis (HCC)    Past Surgical History:  Procedure Laterality Date   ANTERIOR CERVICAL DECOMP/DISCECTOMY FUSION N/A 11/29/2017   Procedure: Cervical five-six, six-seven Anterior Cervical Decompression Fusion;  Surgeon: Jadene Pierini, MD;  Location: [facility] OR;  Service: Neurosurgery;  Laterality: N/A;  Cervical five-six, six-seven Anterior Cervical Decompression Fusion   CATARACT EXTRACTION     FEMUR IM NAIL Left 08/16/2021   Procedure: INTRAMEDULLARY (IM) NAIL FEMORAL;  Surgeon: Deeann Saint, MD;  Location: [facility] ORS;  Service: Orthopedics;  Laterality: Left;   IR IVC FILTER PLMT / S&I /IMG GUID/MOD SED  12/08/2017   Patient Active Problem List   Diagnosis Date Noted    Age-related osteoporosis without current pathological fracture 07/27/2022   Mild recurrent major depression (HCC) 07/27/2022   Hypertensive heart disease with other congestive heart failure (HCC) 02/14/2022   Chronic indwelling Foley catheter 02/14/2022   Closed hip fracture (HCC) 08/15/2021   DVT (deep venous thrombosis) (HCC) 08/15/2021   Quadriplegia (HCC) 08/15/2021   Fall 08/15/2021   Constipation due to slow transit 08/31/2018   Trauma 06/05/2018   Neuropathic pain 06/05/2018   Neurogenic bowel 06/05/2018   Vaginal yeast infection 01/30/2018   Healthcare-associated pneumonia 01/25/2018   Chronic allergic rhinitis 01/24/2018   Depression with anxiety 01/24/2018   UTI due to Klebsiella species 01/24/2018   Protein-calorie malnutrition, severe (HCC) 01/24/2018   S/P insertion of IVC (inferior vena caval) filter 01/24/2018   Tetraparesis (HCC) 01/20/2018   Neurogenic bladder 01/20/2018   Reactive depression    Benign essential HTN    Acute postoperative anemia due to expected blood loss    Central cord syndrome at C6 level of cervical spinal cord (HCC) 11/29/2017   Allergy to alpha-gal 11/25/2016   SCC (squamous cell carcinoma) 04/08/2014    ONSET DATE: 08/15/2021 (fall with B hip fx); Initial injury was in 2019- quadraplegia due to central cord syndrome of C6 secondary to neck fracture.   REFERRING DIAG: Bilateral Hip fx; Left Open- s/p Left IM nail femoral on 08/16/2021; Right closed  THERAPY DIAG:  Muscle weakness (generalized)  Other lack of coordination  Central cord syndrome, sequela (HCC)  Other abnormalities of gait and mobility  Difficulty in walking, not elsewhere classified  Closed fracture of both hips, initial encounter (HCC)  Abnormality of gait and mobility  Unsteadiness on feet  Repeated falls  Cervicalgia  Rationale for Evaluation and Treatment Rehabilitation  SUBJECTIVE:   SUBJECTIVE STATEMENT:   Patient reports doing well without any new  complaints. Denies any falls or pain.         Pt accompanied by:  Paid caregiver-   PERTINENT HISTORY: Sherry [name] is an 89yoF who experienced a fall at home on 08/15/2021 with hip fracture, s/p Left hip ORIF. PMH: quadriplegia due to central cord syndrome of C6 secondary to neck fracture, HTN, depression with anxiety, neurogenic bladder, bilateral DVT on Xarelto (s/p of IVC filter placement). Prior history of significant PT/OT with improvement of function. Pt has full use of shoulder/elbows, limited use of hands, adaptive self feeding sucessful.     PAIN:  None current but does endorse some left thigh pain intermittent  OBJECTIVE:    TODAY'S TREATMENT:   05/12/23   Therapeutic activities:  -SPT from powerwheelchair to mat table - Max assist with difficulty pivoting right LE today Pivot to right from chair to mat and then moved chair to right side of patient at end of session to transfer patient back to her chair from mat.  -Dynamic forward, lateral, diagonal reaching reaching 2 sets x 15 -Dynamic lateral weight shifting- placing some weight through each hand (Careful not too much weight through hands to avoid any hand/finger injury)  x 20 reps each side -Resistive  ham curl RTB 2 sets x 10 reps to improve LE function with LE foot placement for transfers.    Therex LAQ active 3 x 12 reps  Seated hip abd 3 x 12 reps each LE Seated hip add 2 x 10 reps with 5 sec hold- ball squeeze   PATIENT EDUCATION: Education details: Exercise technique, Discussion of how to obtain order/F2F for a new brace Patient verbalized understanding.   HOME EXERCISE PROGRAM: No changes at this time  Access Code: [facility] URL: [email or web address] Date: 11/23/2021 Prepared by: Precious Bard Exercises - Seated Gluteal Sets - 2 x daily - 7 x weekly - 2 sets - 10 reps - 5 hold - Seated Quad Set - 2 x daily - 7 x weekly - 2 sets - 10 reps - 5 hold - Seated Long Arc Quad - 2 x daily - 7 x  weekly - 2 sets - 10 reps - 5 hold - Seated March - 2 x daily - 7 x weekly - 2 sets - 10 reps - 5 hold - Seated Hip Abduction - 2 x daily - 7 x weekly - 2 sets - 10 reps - 5 hold - Seated Shoulder Shrugs - 2 x daily - 7 x weekly - 2 sets - 10 reps - 5 hold - Wheelchair Pressure Relief - 2 x daily - 7 x weekly - 2 sets - 10 reps - 5 hold  GOALS: Goals reviewed with patient? Yes  SHORT TERM GOALS: Target date: 02/22/2022  Pt will be independent with initial UE strengthening HEP in order to improve strength and balance in order to decrease fall risk and improve function at home and work. Baseline: 10/19/2021- patient with no formal UE HEP; 12/02/2021= Patient verbalized knowledge of HEP including use of theraband for UE strengthening.  Goal status: GOAL MET  LONG TERM GOALS: Target  date: 06/20/2023  Pt will be independent with final for UE/LE HEP in order to improve strength and balance in order to decrease fall risk and improve function at home and work. Baseline: Patient is currently BLE NWB and unable to participate in HEP. Has order for UE strengthening. 01/11/2022- Patient now able to participate in LE strengthening although NWB still- good understanding for some basic exercises. Will keep goal active to incorporate progressive LE strengthening exercises. 04/07/2022= Patient still participating in progressive LE seated HEP and has no questions at this time.  Goal status: MET  2.  Pt will improve FOTO to target score of 35  to display perceived improvements in ability to complete ADL's.  Baseline: 10/19/2021= 12; 12/02/2021= 17; 01/11/2022=17; 04/07/2022= 17; 07/05/2022=17; 10/06/2022- outcome measure not appropriate as patient in non-ambulatory and not consistently standing. Goal status: Goal not appropriate  3.  Pt will increase strength of B UE  by at least 1/2 MMT grade in order to demonstrate improvement in strength and function  Baseline: patient has range of 2-/5 to 4/5 BUE Strength; 12/02/2021= 4/5  except with wrist ext. 01/11/2022= 4/5 B UE strength expept for wrist Goal status: Goal revised-no longer appropriate- working with OT on all UE strengthening.   4. Pt. Will increase strength of RLE by at least 1/2 MMT grade in order to demonstrate improvement in Standing/transfers.  Baseline: 2-/5 Right hip flex/knee ext/flex; 04/07/2022= 2- with right hip flex/knee ext (lacking 28 deg from zero) 4/8= Patient able to ext right knee lacking 18 deg from zero. 07/05/2022=Left knee approx 8 deg from zero and right knee = 23 deg from zero; 12/29/2022 - Left LE = full ROM 4-/5 and right knee ext= 23 deg from zero= 2+/5 (gravity minimized); 03/28/2023= Left knee ext=  4/5 and right knee ext= 3-/5 (able to move lower leg through incomplete ROM -lacking 20 deg from full ROM against gravity)  Goal status: Progressing  5. Pt. Will demo ability to stand pivot transfer with max assist for improved functional mobility and less dependent need on mechanical device.   Baseline: Dependent on hoyer lift for all transfers; 04/07/2022- Unable to test secondary to patient with recent UTI and right hand procedure - will attempt to reassess next visit. ; 04/12/22: Unable to successfully stand due to feet plates on loaned power w/c; 05/10/2022- Patient still having to use rental chair and has not received her original power w/c back to practice SPT. 07/04/2021= Patient just received new power w/c and attempted standing from 1st time today- Patient able to stand with max assist today from w/c and did not attempt to perform pivot transfer- difficulty with static standing and placing weight on right LE. 10/06/2022- Not assessed today- patient needs new AFO prior to attempting transfers; 12/29/2022= Patient has not attempted SPT yet - still trying to improve LE strength enough to stand well without knee buckling with weight shifting. 03/28/2023- not attempted today due to ill fitting brace but patient last performed SPT on 01/10/23 and was able to  perform with Max A with feet pre-positioned to angle toward direction of transfer  Goal status: ONGOING  6. Pt. Will demonstrate improved functional LE strength as seen by ability to stand > 2 min for improved transfer ability and pregait abilities.   Baseline: not assessed today due to recent dx: UTI; 04/07/2022- Unable to test secondary to patient with recent UTI and right hand procedure - will attempt to reassess next visit; 04/12/22: Unable to successfully stand due to feet  plates on loaned power w/c 05/10/2022- Patient still having to use rental chair and has not received her original power w/c back to practice Standing. 07/05/2022- Patient able to stand with max assist at trunk today for 2 min 3 sec. Will keep goal active to ensure consistency. 10/06/2022= Unable to assess today- patient with some recent blood pressure issues and needs new AFO. 11/24/2022= Patient demonstrated multiple rounds of standing in // bars- holding up to 1:45 min today but has been able to exhibit 2 min in recent past- working on consistency. 12/29/2022= 2 min 40 sec with patient demonstrating improved overall posture and ability to contract glutes and quads with just min assist at trunk today.   Goal status: MET  7. Pt. Will demonstrate improved functional LE strength as seen by ability to stand > 5 min for improved transfer ability and pregait abilities. Baseline: 12/29/2022= 2 min 40 sec with patient demonstrating improved overall posture and ability to contract glutes and quads with just min assist at trunk today. 03/28/2023= Attempted 2 trials today - at best 2 min 48 sec today but during cert- did stand for 3 min 44 sec.  Goal Status: Ongoing- Patient will likely receive new AFO in upcoming weeks and anticipate more standing time once received.  8. Patient will perform sit to stand transfer with moderate assist consistently > 75% of time for improved transfer ability and less dependence on caregiver.   Baseline: 12/29/2022-  Patient currently max assist level assistance with all sit to stand activities. 03/28/2023= Patient is still majority of max assist to stand yet able to pull up from wheelchair smoother in one motion.   Goal status: ONGOING   ASSESSMENT:  CLINICAL IMPRESSION:   Treatment continues to focus on trunk mobility in all directions- Improved anterior and lateral trunk lean with practice. Patient to receive new brace next week and will return to PT in 2 weeks for reassessment. Hopefully will resume standing if brace able to stabilize foot and not slide. Pt will continue to benefit from skilled PT services to optimize independence and reduced caregiver support.   Will focus more on transfers, Sit to stand and standing once patient receives AFO.   OBJECTIVE IMPAIRMENTS Abnormal gait, decreased activity tolerance, decreased balance, decreased coordination, decreased endurance, decreased mobility, difficulty walking, decreased ROM, decreased strength, hypomobility, impaired flexibility, impaired UE functional use, postural dysfunction, and pain.   ACTIVITY LIMITATIONS carrying, lifting, bending, sitting, standing, squatting, sleeping, stairs, transfers, bed mobility, continence, bathing, toileting, dressing, self feeding, reach over head, hygiene/grooming, and caring for others  PARTICIPATION LIMITATIONS: meal prep, cleaning, laundry, medication management, personal finances, interpersonal relationship, driving, shopping, community activity, and yard work  PERSONAL FACTORS Age, Time since onset of injury/illness/exacerbation, and 1-2 comorbidities: HTN, cervical Sx  are also affecting patient's functional outcome.   REHAB POTENTIAL: Good  CLINICAL DECISION MAKING: Evolving/moderate complexity  EVALUATION COMPLEXITY: Moderate  PLAN: PT FREQUENCY: 1-2x/week  PT DURATION: 12 weeks  PLANNED INTERVENTIONS: Therapeutic exercises, Therapeutic activity, Neuromuscular re-education, Balance training, Gait  training, Patient/Family education, Self Care, Joint mobilization, DME instructions, Dry Needling, Electrical stimulation, Wheelchair mobility training, Spinal mobilization, Cryotherapy, Moist heat, and Manual therapy  PLAN FOR NEXT SESSION:  LE and core strength training, Sit to stand and static standing; progress to SPT as able.  Reassess next visit with new brace.   11:36 AM, 05/12/23 Louis Meckel, PT Physical Therapist - [facility]  Outpatient Physical Therapy- Main Campus [phone]

## 2023-05-16 ENCOUNTER — Ambulatory Visit: Payer: Medicare HMO | Admitting: Occupational Therapy

## 2023-05-16 ENCOUNTER — Ambulatory Visit: Payer: Medicare HMO

## 2023-05-18 ENCOUNTER — Ambulatory Visit: Payer: Medicare HMO

## 2023-05-18 ENCOUNTER — Ambulatory Visit: Payer: Medicare HMO | Admitting: Occupational Therapy

## 2023-05-23 ENCOUNTER — Ambulatory Visit: Payer: Medicare HMO

## 2023-05-23 ENCOUNTER — Ambulatory Visit: Payer: Medicare HMO | Admitting: Occupational Therapy

## 2023-05-23 DIAGNOSIS — R2689 Other abnormalities of gait and mobility: Secondary | ICD-10-CM

## 2023-05-23 DIAGNOSIS — S72001A Fracture of unspecified part of neck of right femur, initial encounter for closed fracture: Secondary | ICD-10-CM

## 2023-05-23 DIAGNOSIS — M6281 Muscle weakness (generalized): Secondary | ICD-10-CM | POA: Diagnosis not present

## 2023-05-23 DIAGNOSIS — R278 Other lack of coordination: Secondary | ICD-10-CM

## 2023-05-23 DIAGNOSIS — R269 Unspecified abnormalities of gait and mobility: Secondary | ICD-10-CM

## 2023-05-23 DIAGNOSIS — R262 Difficulty in walking, not elsewhere classified: Secondary | ICD-10-CM

## 2023-05-23 DIAGNOSIS — S14129S Central cord syndrome at unspecified level of cervical spinal cord, sequela: Secondary | ICD-10-CM

## 2023-05-23 NOTE — Therapy (Signed)
 OUTPATIENT PHYSICAL THERAPY NEURO TREATMENT/Physical Therapy Progress Note   Dates of reporting period  03/28/2023   to   05/23/2023     Patient Name: Sherry [name] MRN: [id number] DOB:November 02, 1934, 89 y.o., female Today's Date: 05/24/2023   PCP: Sherry Gehrig, MD REFERRING PROVIDER: Marlynn [name]   PT End of Session - 05/23/23 1158     Visit Number 110    Number of Visits 124    Date for PT Re-Evaluation 06/20/23    Authorization Type [employer] FOTO performed by PT on eval (7/20), score 12, Progress note on 03/10/2020; PN on 08/20/2020    Authorization Time Period Initial cert 1/47/8295-62/13/0865; Recert 01/11/2022-04/05/2022; Recert 04/07/2022-06/30/2022    Progress Note Due on Visit 110    PT Start Time 1148    PT Stop Time 1224    PT Time Calculation (min) 36 min    Equipment Utilized During Treatment Gait belt    Activity Tolerance Patient tolerated treatment well    Behavior During Therapy WFL for tasks assessed/performed                       Past Medical History:  Diagnosis Date   Acute blood loss anemia    Arthritis    Cancer (HCC)    skin   Central cord syndrome at C6 level of cervical spinal cord (HCC) 11/29/2017   Hypertension    Protein-calorie malnutrition, severe (HCC) 01/24/2018   S/P insertion of IVC (inferior vena caval) filter 01/24/2018   Tetraparesis (HCC)    Past Surgical History:  Procedure Laterality Date   ANTERIOR CERVICAL DECOMP/DISCECTOMY FUSION N/A 11/29/2017   Procedure: Cervical five-six, six-seven Anterior Cervical Decompression Fusion;  Surgeon: Sherry Champion, MD;  Location: [facility] OR;  Service: Neurosurgery;  Laterality: N/A;  Cervical five-six, six-seven Anterior Cervical Decompression Fusion   CATARACT EXTRACTION     FEMUR IM NAIL Left 08/16/2021   Procedure: INTRAMEDULLARY (IM) NAIL FEMORAL;  Surgeon: Sherry Singer, MD;  Location: [facility] ORS;  Service: Orthopedics;  Laterality: Left;   IR IVC FILTER PLMT / S&I /IMG  GUID/MOD SED  12/08/2017   Patient Active Problem List   Diagnosis Date Noted   Age-related osteoporosis without current pathological fracture 07/27/2022   Mild recurrent major depression (HCC) 07/27/2022   Hypertensive heart disease with other congestive heart failure (HCC) 02/14/2022   Chronic indwelling Foley catheter 02/14/2022   Closed hip fracture (HCC) 08/15/2021   DVT (deep venous thrombosis) (HCC) 08/15/2021   Quadriplegia (HCC) 08/15/2021   Fall 08/15/2021   Constipation due to slow transit 08/31/2018   Trauma 06/05/2018   Neuropathic pain 06/05/2018   Neurogenic bowel 06/05/2018   Vaginal yeast infection 01/30/2018   Healthcare-associated pneumonia 01/25/2018   Chronic allergic rhinitis 01/24/2018   Depression with anxiety 01/24/2018   UTI due to Klebsiella species 01/24/2018   Protein-calorie malnutrition, severe (HCC) 01/24/2018   S/P insertion of IVC (inferior vena caval) filter 01/24/2018   Tetraparesis (HCC) 01/20/2018   Neurogenic bladder 01/20/2018   Reactive depression    Benign essential HTN    Acute postoperative anemia due to expected blood loss    Central cord syndrome at C6 level of cervical spinal cord (HCC) 11/29/2017   Allergy to alpha-gal 11/25/2016   SCC (squamous cell carcinoma) 04/08/2014    ONSET DATE: 08/15/2021 (fall with B hip fx); Initial injury was in 2019- quadraplegia due to central cord syndrome of C6 secondary to neck fracture.   REFERRING DIAG: Bilateral Hip  fx; Left Open- s/p Left IM nail femoral on 08/16/2021; Right closed  THERAPY DIAG:  Muscle weakness (generalized)  Other lack of coordination  Central cord syndrome, sequela (HCC)  Other abnormalities of gait and mobility  Difficulty in walking, not elsewhere classified  Closed fracture of both hips, initial encounter (HCC)  Abnormality of gait and mobility  Rationale for Evaluation and Treatment Rehabilitation  SUBJECTIVE:   SUBJECTIVE STATEMENT:   Patient reports  she received her new left brace/shoe but states dealing with some pressure issues and going to call her Prosthetist later today.  Denies any falls or pain.         Pt accompanied by:  Paid caregiver-   PERTINENT HISTORY: Sherry [name] is an 87yoF who experienced a fall at home on 08/15/2021 with hip fracture, s/p Left hip ORIF. PMH: quadriplegia due to central cord syndrome of C6 secondary to neck fracture, HTN, depression with anxiety, neurogenic bladder, bilateral DVT on Xarelto  (s/p of IVC filter placement). Prior history of significant PT/OT with improvement of function. Pt has full use of shoulder/elbows, limited use of hands, adaptive self feeding sucessful.     PAIN:  None current but does endorse some left thigh pain intermittent  OBJECTIVE:    TODAY'S TREATMENT:   05/23/23   Physical therapy treatment session today consisted of completing assessment of goals and administration of testing as demonstrated and documented in flow sheet, treatment, and goals section of this note. Addition treatments may be found below.    Therex LAQ active 2 x 15 reps (physical assist on right to achieve full ROM)  Seated hip abd 2 x 15 reps each LE Hip circles (seated ) AAROM on each LE 2 x 15 reps Seated hip march- AAROM on each 2 x 15 reps  *New brace - observed- tight fit on anterior lateral dorsum of left foot.   PATIENT EDUCATION: Education details: Exercise technique Patient verbalized understanding.   HOME EXERCISE PROGRAM: No changes at this time  Access Code: [facility] URL: [email or web address] Date: 11/23/2021 Prepared by: Marina  Moser Exercises - Seated Gluteal Sets - 2 x daily - 7 x weekly - 2 sets - 10 reps - 5 hold - Seated Quad Set - 2 x daily - 7 x weekly - 2 sets - 10 reps - 5 hold - Seated Long Arc Quad - 2 x daily - 7 x weekly - 2 sets - 10 reps - 5 hold - Seated March - 2 x daily - 7 x weekly - 2 sets - 10 reps - 5 hold - Seated Hip Abduction - 2 x daily - 7  x weekly - 2 sets - 10 reps - 5 hold - Seated Shoulder Shrugs - 2 x daily - 7 x weekly - 2 sets - 10 reps - 5 hold - Wheelchair Pressure Relief - 2 x daily - 7 x weekly - 2 sets - 10 reps - 5 hold  GOALS: Goals reviewed with patient? Yes  SHORT TERM GOALS: Target date: 02/22/2022  Pt will be independent with initial UE strengthening HEP in order to improve strength and balance in order to decrease fall risk and improve function at home and work. Baseline: 10/19/2021- patient with no formal UE HEP; 12/02/2021= Patient verbalized knowledge of HEP including use of theraband for UE strengthening.  Goal status: GOAL MET  LONG TERM GOALS: Target date: 06/20/2023  Pt will be independent with final for UE/LE HEP in order to improve strength and balance in order to decrease  fall risk and improve function at home and work. Baseline: Patient is currently BLE NWB and unable to participate in HEP. Has order for UE strengthening. 01/11/2022- Patient now able to participate in LE strengthening although NWB still- good understanding for some basic exercises. Will keep goal active to incorporate progressive LE strengthening exercises. 04/07/2022= Patient still participating in progressive LE seated HEP and has no questions at this time.  Goal status: MET  2.  Pt will improve FOTO to target score of 35  to display perceived improvements in ability to complete ADL's.  Baseline: 10/19/2021= 12; 12/02/2021= 17; 01/11/2022=17; 04/07/2022= 17; 07/05/2022=17; 10/06/2022- outcome measure not appropriate as patient in non-ambulatory and not consistently standing. Goal status: Goal not appropriate  3.  Pt will increase strength of B UE  by at least 1/2 MMT grade in order to demonstrate improvement in strength and function  Baseline: patient has range of 2-/5 to 4/5 BUE Strength; 12/02/2021= 4/5 except with wrist ext. 01/11/2022= 4/5 B UE strength expept for wrist Goal status: Goal revised-no longer appropriate- working with OT on all  UE strengthening.   4. Pt. Will increase strength of RLE by at least 1/2 MMT grade in order to demonstrate improvement in Standing/transfers.  Baseline: 2-/5 Right hip flex/knee ext/flex; 04/07/2022= 2- with right hip flex/knee ext (lacking 28 deg from zero) 4/8= Patient able to ext right knee lacking 18 deg from zero. 07/05/2022=Left knee approx 8 deg from zero and right knee = 23 deg from zero; 12/29/2022 - Left LE = full ROM 4-/5 and right knee ext= 23 deg from zero= 2+/5 (gravity minimized); 03/28/2023= Left knee ext=  4/5 and right knee ext= 3-/5 (able to move lower leg through incomplete ROM -lacking 20 deg from full ROM against gravity); 05/23/2023= right knee ext= 3-/5 (able to move lower leg through incomplete ROM -lacking 17 deg from full ROM against gravity)  Goal status: Progressing  5. Pt. Will demo ability to stand pivot transfer with max assist for improved functional mobility and less dependent need on mechanical device.   Baseline: Dependent on hoyer lift for all transfers; 04/07/2022- Unable to test secondary to patient with recent UTI and right hand procedure - will attempt to reassess next visit. ; 04/12/22: Unable to successfully stand due to feet plates on loaned power w/c; 05/10/2022- Patient still having to use rental chair and has not received her original power w/c back to practice SPT. 07/04/2021= Patient just received new power w/c and attempted standing from 1st time today- Patient able to stand with max assist today from w/c and did not attempt to perform pivot transfer- difficulty with static standing and placing weight on right LE. 10/06/2022- Not assessed today- patient needs new AFO prior to attempting transfers; 12/29/2022= Patient has not attempted SPT yet - still trying to improve LE strength enough to stand well without knee buckling with weight shifting. 03/28/2023- not attempted today due to ill fitting brace but patient last performed SPT on 01/10/23 and was able to perform with Max A  with feet pre-positioned to angle toward direction of transfer; 05/23/2023- Not tested today but on last visit- patient able to perform SPT with max assist yet concern for twisting knee as she was unable to turn/pivot foot .  Goal status: ONGOING  6. Pt. Will demonstrate improved functional LE strength as seen by ability to stand > 2 min for improved transfer ability and pregait abilities.   Baseline: not assessed today due to recent dx: UTI; 04/07/2022- Unable  to test secondary to patient with recent UTI and right hand procedure - will attempt to reassess next visit; 04/12/22: Unable to successfully stand due to feet plates on loaned power w/c 05/10/2022- Patient still having to use rental chair and has not received her original power w/c back to practice Standing. 07/05/2022- Patient able to stand with max assist at trunk today for 2 min 3 sec. Will keep goal active to ensure consistency. 10/06/2022= Unable to assess today- patient with some recent blood pressure issues and needs new AFO. 11/24/2022= Patient demonstrated multiple rounds of standing in // bars- holding up to 1:45 min today but has been able to exhibit 2 min in recent past- working on consistency. 12/29/2022= 2 min 40 sec with patient demonstrating improved overall posture and ability to contract glutes and quads with just min assist at trunk today.   Goal status: MET  7. Pt. Will demonstrate improved functional LE strength as seen by ability to stand > 5 min for improved transfer ability and pregait abilities. Baseline: 12/29/2022= 2 min 40 sec with patient demonstrating improved overall posture and ability to contract glutes and quads with just min assist at trunk today. 03/28/2023= Attempted 2 trials today - at best 2 min 48 sec today but during cert- did stand for 3 min 44 sec.  Goal Status: Ongoing- Patient will likely receive new AFO in upcoming weeks and anticipate more standing time once received.  8. Patient will perform sit to stand  transfer with moderate assist consistently > 75% of time for improved transfer ability and less dependence on caregiver.   Baseline: 12/29/2022- Patient currently max assist level assistance with all sit to stand activities. 03/28/2023= Patient is still majority of max assist to stand yet able to pull up from wheelchair smoother in one motion.   Goal status: ONGOING   ASSESSMENT:  CLINICAL IMPRESSION:   Patient arrived with good motivation- Hopeful to return to transfers/standing but new brace may need adjustment as it was tight fitting on lateral anterior foot side. Patient did demonstrate improved height with knee extension in seated position -demonstrating slight improved strength and able to eccentrically hold 3 sec today for multiple reps. She is to contact her Prosthetist and seek adjustment.  Patient's condition has the potential to improve in response to therapy. Maximum improvement is yet to be obtained. The anticipated improvement is attainable and reasonable in a generally predictable time. Pt will continue to benefit from skilled PT services to optimize independence and reduced caregiver support.   Will focus more on transfers, Sit to stand and standing once patient receives AFO.   OBJECTIVE IMPAIRMENTS Abnormal gait, decreased activity tolerance, decreased balance, decreased coordination, decreased endurance, decreased mobility, difficulty walking, decreased ROM, decreased strength, hypomobility, impaired flexibility, impaired UE functional use, postural dysfunction, and pain.   ACTIVITY LIMITATIONS carrying, lifting, bending, sitting, standing, squatting, sleeping, stairs, transfers, bed mobility, continence, bathing, toileting, dressing, self feeding, reach over head, hygiene/grooming, and caring for others  PARTICIPATION LIMITATIONS: meal prep, cleaning, laundry, medication management, personal finances, interpersonal relationship, driving, shopping, community activity, and yard  work  PERSONAL FACTORS Age, Time since onset of injury/illness/exacerbation, and 1-2 comorbidities: HTN, cervical Sx  are also affecting patient's functional outcome.   REHAB POTENTIAL: Good  CLINICAL DECISION MAKING: Evolving/moderate complexity  EVALUATION COMPLEXITY: Moderate  PLAN: PT FREQUENCY: 1-2x/week  PT DURATION: 12 weeks  PLANNED INTERVENTIONS: Therapeutic exercises, Therapeutic activity, Neuromuscular re-education, Balance training, Gait training, Patient/Family education, Self Care, Joint mobilization, DME instructions, Dry Needling,  Electrical stimulation, Wheelchair mobility training, Spinal mobilization, Cryotherapy, Moist heat, and Manual therapy  PLAN FOR NEXT SESSION:  LE and core strength training, Sit to stand and static standing; progress to SPT as able.  Reassess next visit with new brace.   7:55 AM, 05/24/23 Ossie Blend, PT Physical Therapist - [facility]  Outpatient Physical Therapy- Main Campus [phone]

## 2023-05-23 NOTE — Therapy (Signed)
 Occupational Therapy Neuro Treatment Note   Patient Name: Sherry [name] MRN: [id number] DOB:October 01, 1934, 89 y.o., female Today's Date: 11/29/2022  PCP:  Waunita Haff PROVIDER: Valrie Gehrig, MD  END OF SESSION:   OT End of Session - 05/23/23 1504     Visit Number [id number]    Number of Visits 192    Date for OT Re-Evaluation 06/27/23    OT Start Time 1100    OT Stop Time 1145    OT Time Calculation (min) 45 min    Activity Tolerance Patient tolerated treatment well    Behavior During Therapy [facility] for tasks assessed/performed                     Past Medical History:  Diagnosis Date   Acute blood loss anemia    Arthritis    Cancer (HCC)    skin   Central cord syndrome at C6 level of cervical spinal cord (HCC) 11/29/2017   Hypertension    Protein-calorie malnutrition, severe (HCC) 01/24/2018   S/P insertion of IVC (inferior vena caval) filter 01/24/2018   Tetraparesis (HCC)    Past Surgical History:  Procedure Laterality Date   ANTERIOR CERVICAL DECOMP/DISCECTOMY FUSION N/A 11/29/2017   Procedure: Cervical five-six, six-seven Anterior Cervical Decompression Fusion;  Surgeon: Cannon Champion, MD;  Location: [facility] OR;  Service: Neurosurgery;  Laterality: N/A;  Cervical five-six, six-seven Anterior Cervical Decompression Fusion   CATARACT EXTRACTION     FEMUR IM NAIL Left 08/16/2021   Procedure: INTRAMEDULLARY (IM) NAIL FEMORAL;  Surgeon: Marlynn Singer, MD;  Location: [facility] ORS;  Service: Orthopedics;  Laterality: Left;   IR IVC FILTER PLMT / S&I /IMG GUID/MOD SED  12/08/2017   Patient Active Problem List   Diagnosis Date Noted   Age-related osteoporosis without current pathological fracture 07/27/2022   Mild recurrent major depression (HCC) 07/27/2022   Hypertensive heart disease with other congestive heart failure (HCC) 02/14/2022   Chronic indwelling Foley catheter 02/14/2022   Closed hip fracture (HCC) 08/15/2021   DVT (deep venous thrombosis) (HCC) 08/15/2021    Quadriplegia (HCC) 08/15/2021   Fall 08/15/2021   Constipation due to slow transit 08/31/2018   Trauma 06/05/2018   Neuropathic pain 06/05/2018   Neurogenic bowel 06/05/2018   Vaginal yeast infection 01/30/2018   Healthcare-associated pneumonia 01/25/2018   Chronic allergic rhinitis 01/24/2018   Depression with anxiety 01/24/2018   UTI due to Klebsiella species 01/24/2018   Protein-calorie malnutrition, severe (HCC) 01/24/2018   S/P insertion of IVC (inferior vena caval) filter 01/24/2018   Tetraparesis (HCC) 01/20/2018   Neurogenic bladder 01/20/2018   Reactive depression    Benign essential HTN    Acute postoperative anemia due to expected blood loss    Central cord syndrome at C6 level of cervical spinal cord (HCC) 11/29/2017   Allergy to alpha-gal 11/25/2016   SCC (squamous cell carcinoma) 04/08/2014   ONSET DATE: 11/29/2017  REFERRING DIAG: Central Cord Syndrome at C6 of the Cervical Spinal Cord, Fall with Bilateral Closed Hip Fractures, with ORIF repair with Intramedullary Nailing of the Left Hip Fracture.   THERAPY DIAG:  Muscle weakness (generalized)  Other lack of coordination  Rationale for Evaluation and Treatment: Rehabilitation  SUBJECTIVE:  SUBJECTIVE STATEMENT:  Pt. Reports having had a nice weekend Pt accompanied by: self, Personal Care aide  PERTINENT HISTORY: Central Cord Syndrome at C6 of the Cervical Spinal Cord, Fall with Bilateral Closed Hip Fractures, with ORIF repair with Intramedullary Nailing of the Left Hip Fracture.  PRECAUTIONS: None  WEIGHT BEARING RESTRICTIONS: No  PAIN:  Are you having pain? No pain, just shoulder stiffness   LIVING ENVIRONMENT: Lives with: [facility] Stairs: No Has following equipment at home: Wheelchair (power)  PLOF: Independent   PATIENT GOALS:  See below for established goals  OBJECTIVE:  Note: Objective measures were completed at Evaluation unless otherwise noted.  HAND  DOMINANCE: Right  ADLs: Caregiver assist with ADLs, and [facility]  MOBILITY STATUS: Uses a power w/c  ACTIVITY TOLERANCE: Activity tolerance: Fair  FUNCTIONAL OUTCOME MEASURES:  Measurements:   12/09/2021:   Shoulder flexion: R: 120(136), L: 138(150) Shoulder abduction: R: 108(120), L: 110(120) Elbow: R: 0-148, L: 0-146 Wrist flexion: R: 55(62), L: 78 Writ extension: R: 34(55), L: 3(4) RD: R: 12(20), L: 16(26) UD: R: 8(24), L: 6(18) Thumb radial abduction: R: 11(20), L: 8(20) Digit flexion to the [facility]: R:  2nd: 5cm(3.5cm), 3rd: 7.5 cm(5cm), 4th: 7cm(3cm), 5th: 6cm(5.5cm) L: 2nd: 8cm(8cm), 3rd: 8cm(8cm), 4th: 7.5cm(7cm), 5th: 7cm(7cm)   02/03/2022:   Shoulder flexion: R: 122(138), L: 148(154) Shoulder abduction: R: 109(125), L: 125(125 Elbow: R: 0-148, L: 0-146 Wrist flexion: R: 60(68), L: 79 Writ extension: R: 40(55), L: 3(10) RD: R: 14(24), L: 20(28) UD: R: 8(24), L: 6(18) Thumb radial abduction: R: 20(25), L: 8(20) Digit flexion to the [facility]: R:  2nd: 4.5cm(3cm), 3rd: 6.5cm(4.5cm), 4th: 6.5 cm(3cm), 5th: 4.5cm(5cm) L: 2nd: 8cm(8cm), 3rd: 8cm(7cm), 4th: 7.5cm(7cm), 5th: 6.5cm(6.5cm)   04/07/2022:   Shoulder flexion: R: 125(150), L: 160(160) Shoulder abduction: R: 109(125), L: 125(125 Elbow: R: 0-148, L: 0-146 Wrist flexion: R: 60(68), L: 79 Writ extension: R: 40(55), L: 5(18) RD: R: 14(24), L: 20(28) UD: R: 18(24), L: 8(18) Thumb radial abduction: R: 20(25), L: 8(20) Digit flexion to the [facility]: R:  2nd: 4.5 cm(4 cm), 3rd: 6.5cm(3cm), 4th: 7 cm (5cm), 5th: 5cm(5cm) L: 2nd: 8cm(8cm), 3rd: 8cm(7cm), 4th: 7.5cm(7cm), 5th: 7cm(7cm)   07/05/2022:   Shoulder flexion: R: 125(154), L: 160(160) Shoulder abduction: R: 95(130), L: 143(143) Elbow: R: 0-148, L: 0-146 Wrist flexion: R: 60(68), L: 79 Writ extension: R: 40(60), L: 0(12) RD: R: 10(24), L: 12(20) UD: R: 16(24), L: 8(16) Thumb radial abduction: R: 20(25), L: 8(20) Digit flexion to the [facility]: R:  2nd: 4  cm(4cm), 3rd: 6.5cm( 3.5cm), 4th: 7 cm (5cm), 5th: 6cm(5cm) L: 2nd: 8cm(8cm), 3rd: 8cm(7cm), 4th: 8cm(7cm), 5th: 7cm(7cm)   09/13/2022:   Shoulder flexion: R: 125(154), L: 160(160) Shoulder abduction: R: 100(130), L: 143(143) Elbow: R: 0-150, L: 0-150 Wrist flexion: R: 55(68), L: 79 Writ extension: R: 45(60), L: 0(12) RD: R: 10(24), L: 12(20) UD: R: 16(24), L: 8(16) Thumb radial abduction: R: 20(25), L: 8(20) Digit flexion to the [facility]: R:  2nd: 4 cm(4cm), 3rd: 6cm( 5cm), 4th: 6 cm (4cm), 5th: 5cm(5cm) L: 2nd: 7cm(cm), 3rd: 7cm(6cm), 4th: 7cm(6cm), 5th: 6cm(6cm)   10/27/2022   Shoulder flexion: R: 128(154), L: 160(160) Shoulder abduction: R: 105(130), L: 143(143) Elbow: R: 0-150, L: 0-150 Wrist flexion: R: 55(68), L: 79 Writ extension: R: 46(64), L: 0(20) RD: R: 10(20), L: 18(24) UD: R: 18(28), L: 8(20) Thumb radial abduction: R: 30(42), L:20(24) Digit flexion to the [facility]: R:  2nd: 4 cm(3cm), 3rd: 6cm( 6cm), 4th: 6 cm (5cm), 5th: 5cm(5cm) L: 2nd: 8cm(8cm), 3rd: 8cm(6cm), 4th: 8cm(7cm), 5th: 6cm(6cm)  01/19/2023  Shoulder flexion: R: 131(154), L: 160(160) Shoulder abduction: R: 110(130), L: 143(143) Elbow: R: 0-150, L: 0-150 Wrist flexion: R: 55(68), L: 79 Writ extension: R: 46(64), L: 0(20) RD:  R: 10(20), L: 18(24) UD: R: 18(28), L: 14(20) Thumb radial abduction: R: 38(48), L:24(26) Thumb IP flexion: R: 35(50) L: 20(45) Digit flexion to the [facility]: R:  2nd: 3.5 cm(3cm), 3rd: 6cm( 6cm), 4th: 6 cm (5cm), 5th: 5cm(4.5cm) L: 2nd: 8cm(7.5cm), 3rd: 8cm(6cm), 4th: 7.5cm(6cm), 5th: 6cm(4.5cm)  04/04/2023  Shoulder flexion: R: 134(154), L: 160(160) Shoulder abduction: R: 114(130), L: 143(143) Elbow: R: WNL, L: WNL Wrist flexion: R: 55(68), L: 80 Writ extension: R: 46(64), L: -10(10) RD: R: 14(24), L: 20(24) UD: R: 20(30), L: 14(20) Thumb radial abduction: R: 38(48), L:28(30) Thumb IP flexion: R: 40(50) L: 25(45) Digit flexion to the [facility]: R:  2nd: 3.5 cm(3cm), 3rd: 6cm( 5cm),  4th: 6 cm (4cm), 5th: 5cm(5cm) L: 2nd: 8cm(7.5cm), 3rd: 8cm(7cm), 4th: 7.5cm(7cm), 5th: 7cm(6cm)   COORDINATION:   01/19/2023:  9 hole peg test:  Right: 2 pegs placed in 1 min. & 5 sec.  Pt. was able to remove 9 pegs in 17 sec.  Left: 2 pegs placed in 1 min. & 10 sec. Pt. was able to remove 9 pegs in 20 sec.  04/04/23:  Pt. was able to consistently grasp the pegs however each one slipped out of her fingers when attempting to place them into the Pegboard.  SENSATION: Intact  EDEMA: N/A   COGNITION: Overall cognitive status: WNL   VISION: Subjective report: Wears glasses, No changes in vision.  PERCEPTION: Intact  TODAY'S TREATMENT:                                                                                                                              DATE:  05/23/23   Moist heat alternated between bilat hands and R shoulder for muscle relaxation used simultaneous to passive stretching noted below  Manual Therapy:   Pt. tolerated soft tissue massage to the volar, and dorsal surface of each digit on the bilateral hands, digit lateral, and sagittal bands 2/2 to stiffness following moist heat modality. Soft tissue mobilization was performed for carpal, and metacarpal spread stretches. Manual therapy was performed independent of, and in preparation for therapeutic Ex.     Therapeutic Ex.    -Pt. tolerated PROM followed by AROM bilateral wrist extension, PROM for bilateral digit MP, PIP, and DIP flexion, and extension, thumb abduction.  -BUE strengthening with 2# dowel for bilateral shoulder flexion, chest press, rotation, 1 set 10 reps each.   PATIENT EDUCATION: Education details: Bilateral hand and shoulder ROM Person educated: Patient Education method: Explanation, Demonstration, and Verbal cues Education comprehension: verbalized understanding and returned demonstration  HOME EXERCISE PROGRAM: Continue to assess HEP needs, and provide as needed.   GOALS: Goals  reviewed with patient? Yes  LONG TERM GOALS: Target date: 04/13/2023  3.  Patient will demonstrate improved composite finger flexion to be able to firmly hold and use adaptive devices during ADLs, and IADLs.  Baseline: 04/04/2023: Pt. continues to work on improving Digit flexion to the [facility] to be able to  efficiently handle Adaptive equipment. 01/19/2023: Pt. Is improving with Bilateral digit flexion to the [facility]. Pt. has difficulty firmly holding adaptive devices. 12/06/2022: Pt. continues to present with increased MP, PIP, and DIP digit tightness/stiffness limiting the formulation of bilateral composites fists. 10/27/2022: Pt. presents with increased MP, PIP, and DIP digit tightness/stiffness limiting the formulation of bilateral composites fists during this progress reporting period limiting. Digit flexion to the [facility]: R:  2nd: 4 cm(3cm), 3rd: 6cm( 6cm), 4th: 6 cm (5cm), 5th: 5cm(5cm), L: 2nd: 8cm(8cm), 3rd: 8cm(6cm), 4th: 8cm(7cm), 5th: 6cm(6cm) 09/20/2022: Improving digit flexion to the [facility]. 09/13/22: improved digit flexion to the [facility]. 07/05/2022: Pt. presents with digit MP, PIP, and DIP extensor tightness limiting her ability to securely grip objects in her bilateral hands.  04/07/2022: Pt. Has improved with right 2nd, and 3rd digit flexion towards the [facility]. Pt. Continues to have difficulty securely holding and applying deodorant.02/03/2022: Pt. Has improved with digit flexion, however, continues to have difficulty securely holding and using deodorant. 12/09/2021: Bilateral hand/digit MP, PIP, and DIP extension tightness limits her ability achieve digit flexion to hold, and apply deodorant. 10/28/2021:  Pt. continues to have difficulty holding the deodorant. 01/12/2021: Pt. Presents with limited digit extension. Pt. Is able to initiate holding deodorant, however is unable to hold it while using it  Goal status: Ongoing   4.  Pt. Will improve bilateral wrist extension in preparation for anticipating, and initiating  reaching for objects at the table.  Baseline: 04/04/23: R: 46(64) L: -10(10)01/19/2023:  R: 46(64) L: 0(20) 12/06/2022: Pt continues to to progress with bilateral wrist extension in preparation fro functional reaching. 10/27/2022: Pt. Is improving with bilateral wrist extension. R: 46(64) L: 0(20) 09/20/2022: limited PROM in the Left wrist extension 2/2 tightness/stiffness. 09/13/22: R: 55(68), L: 0(12) 07/19/2022: Bilateral wrist extension in limited. 07/05/2022: Right: 40(60) Left: 0(12) 04/07/2022: Right: 40(55) Left: 5(18) 02/03/2022: Right: 34(55) Left: 3(4) 12/09/2021: Right  34(55)  Left  3(4) 10/28/2021:  Right 22(38), Left 0(15) 09/14/2021:  Right: 17(35), left 2(15)  Goal status: Left wrist tightness noted. Ongoing  7.  Pt. Will increase bilateral lateral pinch strength by 2 lbs to be able to securely grasp items during ADLs, and IADL tasks.  Baseline: 01/19/2023: Deferred as the Pinch meter is out for calibration.12/06/2022: Pt. Is able to more securely hold objects during ADLs/IADLs 10/27/2022: Pt. Is improving with holding items with her bilateral thumbs. (Pinch meter out for calibration) 09/20/2022: lateral pinch continues to be limited 09/13/22: R 6.5# L 4# 07/19/2022: TBD 07/05/2022: TBD 07/2022: NT-Pinch meter out for calibration. 02/03/2022: Right: 6#, Left: 4# 12/09/2021: Right: 6#, Left: 4#  Goal status: Deferred   9.  Pt. will complete plant care with modified independence.  Baseline:12/06/2022: Pt. Is independent with plant care in her husband's room, however is not able to access her plants due to furniture blocking access to them. 10/27/2022: Pt. Is able to water plants that are closer, and within reach. Pt. continues to have difficulty reaching for thorough plant care. 09/20/2022: Pt. Continues to have difficulty reaching for thorough plant care. 09/13/22: continues to report intermittent difficulty 07/19/2022: Pt. Continues to be able to water, and care for some of her plants. Pt. Has more difficulty  with plants that are harder to reach. 07/05/2022: pt. Is able to water, and care for some of her plants. Pt. Has more difficulty with plants that are harder to reach. 04/07/2022: Pt. is now able to hold a cup and water her plants.02/03/2022: Pt. has difficulty caring  for her plants.  Goal status:Achieved   10.  Pt. will demonstrate adaptive techniques to assist with the efficiency of self-dressing, or morning care tasks.  Baseline: 04/04/2023:  Pt. continues to work on improving UE functioning to be able to efficiently use Adaptive equipment. 01/19/2203: Pt. Is able to donn her jacket in reverse independently. Pt. requires assist from staff, as Pt. Reports staff have decreased time. 12/06/2022: Pt. requires assist from staff, as Pt. Reports staff have decreased time. 10/27/2022: Pt. is able to assist with initiating UE dressing. Pt. requires assist from personal, and staff care aides. 09/20/2022: Pt. continues to require assist form personal, and staff care aides. 09/13/22: MODA dressing, reports she is often rushed 07/19/2022: Pt. continues to require assist with self-dressing/morning care tasks. 07/05/2022: Continue with goal. 04/07/2022: Pt. Continues to require assist with the efficiency of self-dressing, and morning care tasks.02/03/2022: Pt. requires assist from caregivers 2/2 time limitations during morning care.  Goal status: Ongoing  11.  Pt. will improve BUE strength to improve ADL, and IADL functioning.  Baseline: 04/04/2023: shoulder flexion L 4/5, R 3+/5; shoulder abduction L 4+/5, R 3+/5. elbow flexion B 5/5, elbow extension 4/5 01/19/2023: L 4/5, R 3+/5 12/06/2022: Continue 10/27/2022: shoulder flexion L 4/5, R 3+/5; shoulder abduction L 4+/5, R 3+/5. elbow flexion B 5/5, elbow extension 4/5 09/20/2022: Pt. Presents with limited BUE strength 09/13/22: shoulder flexion L 4/5, R 3/5; elbow flexion B 5/5, elbow extension 4-/5; shoulder abduction L 4+/5, R 3+/5. 07/19/2022: BUE strength continues to be limited.  07/05/2022: shoulder flexion: right 4-/5, abduction: 3+/5, elbow flexion: right: 5/5, left 5/5, extension: right: 4-/5, left 4-/5, wrist extension: right: 3-/5, left: 2-/5  Goal status: Ongoing  12.  Pt. will improve bilateral thumb radial abduction in order to be able to hold the grab bars while standing with PT  Baseline: 04/04/23: R: 38(48), Z:61(09) 01/19/2023: R: 38(48) L: 24(26)12/06/2022: Pt. Continues to present  with limited thumb abduction, however is improving holding onto the parallel bars.10/27/2022: thumb radial abduction: R: 30(42), L: 20(24  Goal status: Ongoing  13.  Pt. Will be able to securely hold, and stabilize medication bottles at a tabletop surface when opening, and closing them.  Baseline: 01/19/2023: Pt. Is now able to securely hold medication bottles while opening them, and stabilizes them on surfaces while opening them. 12/06/2022: Pt is improving with securely stabilizing medication bottles. 10/27/2022: Pt. Stabilizes bottles against her torso when attempting to open, and close them   Goal status: Achieved  14: Pt. Will improve bilateral thumb IP flexion to improve active grasping patterns.    Baseline: 04/04/2023:  R: 40(50) L: 25(45) 01/19/2023: Right 35(50), Left: 20/45)    Goal status: Ongoing    15. Pt. Will improve bilateral FMC/speed, and dexterity. As evidence by improved scores on the 9 hole peg test.  Baseline: 04/04/23: Pt. was able to consistently grasp the pegs however each one slipped out of her fingers when attempting to place them into the Pegboard. 01/19/2023: 2 pegs placed in 1 min. & 5 sec.  Pt. was able to remove 9 pegs in 17 sec.; Left: 2 pegs placed in 1 min. & 10 sec. Pt. was able to remove 9 pegs in 20 sec.   Goal status: Ongoing  ASSESSMENT:  CLINICAL IMPRESSION:  Pt continues to tolerate moist heat, manual therapy, ROM, and the UBE well today. Pt. continues to present with digit MP, PIP, and DIP extensor tightness impacting her ability to  formulate a full composite fist  limiting gross grasping. Pt. continues to benefit from OT services to work on impoving ROM, and UE strength in order to work towards increasing bilateral hand grasp on objects, and increasing engagement of bilateral hands during ADLs, and IADL tasks.     PERFORMANCE DE PERFORMANCE DEFICITS: in functional skills including ADLs, IADLs, coordination, dexterity, ROM, strength, and UE functional use, cognitive skills including , and psychosocial skills including coping strategies and environmental adaptation.   IMPAIRMENTS: are limiting patient from ADLs, IADLs, and leisure.   CO-MORBIDITIES: may have co-morbidities  that affects occupational performance. Patient will benefit from skilled OT to address above impairments and improve overall function.  MODIFICATION OR ASSISTANCE TO COMPLETE EVALUATION: Min-Moderate modification of tasks or assist with assess necessary to complete an evaluation.  OT OCCUPATIONAL PROFILE AND HISTORY: Detailed assessment: Review of records and additional review of physical, cognitive, psychosocial history related to current functional performance.  CLINICAL DECISION MAKING: Moderate - several treatment options, min-mod task modification necessary  REHAB POTENTIAL: Good for stated goals  PLAN:  OT FREQUENCY 2x's a week  OT DURATION: 12 weeks  PLANNED INTERVENTIONS ADL training, A/E training, UE ther. Ex, Manual therapy, neuromuscular re-education, moist heat modality, Paraffin Bath, Splinting, and  Pt./caregiver education   RECOMMENDED OTHER SERVICES: PT  CONSULTED AND AGREED WITH PLAN OF CARE: Patient  PLAN FOR NEXT SESSION: Continue Treatment as per established POC  Duey Ghent, MS, OTR/L   05/23/23, 3:07 PM

## 2023-05-25 ENCOUNTER — Ambulatory Visit: Payer: Medicare HMO | Admitting: Occupational Therapy

## 2023-05-25 ENCOUNTER — Ambulatory Visit: Payer: Medicare HMO

## 2023-05-25 DIAGNOSIS — R2689 Other abnormalities of gait and mobility: Secondary | ICD-10-CM

## 2023-05-25 DIAGNOSIS — S72002A Fracture of unspecified part of neck of left femur, initial encounter for closed fracture: Secondary | ICD-10-CM

## 2023-05-25 DIAGNOSIS — R278 Other lack of coordination: Secondary | ICD-10-CM

## 2023-05-25 DIAGNOSIS — M6281 Muscle weakness (generalized): Secondary | ICD-10-CM

## 2023-05-25 DIAGNOSIS — R269 Unspecified abnormalities of gait and mobility: Secondary | ICD-10-CM

## 2023-05-25 DIAGNOSIS — R262 Difficulty in walking, not elsewhere classified: Secondary | ICD-10-CM

## 2023-05-25 DIAGNOSIS — S14129S Central cord syndrome at unspecified level of cervical spinal cord, sequela: Secondary | ICD-10-CM

## 2023-05-25 NOTE — Therapy (Addendum)
 OUTPATIENT PHYSICAL THERAPY NEURO TREATMENT   Patient Name: Sherry [name] MRN: [id number] DOB:09/13/1934, 89 y.o., female Today's Date: 05/25/2023   PCP: Sherry Gehrig, MD REFERRING PROVIDER: Marlynn [name]   PT End of Session - 05/25/23 1235     Visit Number 111    Number of Visits 124    Date for PT Re-Evaluation 06/20/23    Authorization Type [employer] FOTO performed by PT on eval (7/20), score 12, Progress note on 03/10/2020; PN on 08/20/2020    Authorization Time Period Initial cert [id number]/82/9562-13/09/6576; Recert 01/11/2022-04/05/2022; Recert 04/07/2022-06/30/2022    Progress Note Due on Visit 110    PT Start Time 1148    PT Stop Time 1228    PT Time Calculation (min) 40 min    Equipment Utilized During Treatment Gait belt    Activity Tolerance Patient tolerated treatment well    Behavior During Therapy WFL for tasks assessed/performed                       Past Medical History:  Diagnosis Date   Acute blood loss anemia    Arthritis    Cancer (HCC)    skin   Central cord syndrome at C6 level of cervical spinal cord (HCC) 11/29/2017   Hypertension    Protein-calorie malnutrition, severe (HCC) 01/24/2018   S/P insertion of IVC (inferior vena caval) filter 01/24/2018   Tetraparesis (HCC)    Past Surgical History:  Procedure Laterality Date   ANTERIOR CERVICAL DECOMP/DISCECTOMY FUSION N/A 11/29/2017   Procedure: Cervical five-six, six-seven Anterior Cervical Decompression Fusion;  Surgeon: Sherry Champion, MD;  Location: [facility] OR;  Service: Neurosurgery;  Laterality: N/A;  Cervical five-six, six-seven Anterior Cervical Decompression Fusion   CATARACT EXTRACTION     FEMUR IM NAIL Left 08/16/2021   Procedure: INTRAMEDULLARY (IM) NAIL FEMORAL;  Surgeon: Sherry Singer, MD;  Location: [facility] ORS;  Service: Orthopedics;  Laterality: Left;   IR IVC FILTER PLMT / S&I /IMG GUID/MOD SED  12/08/2017   Patient Active Problem List   Diagnosis Date Noted    Age-related osteoporosis without current pathological fracture 07/27/2022   Mild recurrent major depression (HCC) 07/27/2022   Hypertensive heart disease with other congestive heart failure (HCC) 02/14/2022   Chronic indwelling Foley catheter 02/14/2022   Closed hip fracture (HCC) 08/15/2021   DVT (deep venous thrombosis) (HCC) 08/15/2021   Quadriplegia (HCC) 08/15/2021   Fall 08/15/2021   Constipation due to slow transit 08/31/2018   Trauma 06/05/2018   Neuropathic pain 06/05/2018   Neurogenic bowel 06/05/2018   Vaginal yeast infection 01/30/2018   Healthcare-associated pneumonia 01/25/2018   Chronic allergic rhinitis 01/24/2018   Depression with anxiety 01/24/2018   UTI due to Klebsiella species 01/24/2018   Protein-calorie malnutrition, severe (HCC) 01/24/2018   S/P insertion of IVC (inferior vena caval) filter 01/24/2018   Tetraparesis (HCC) 01/20/2018   Neurogenic bladder 01/20/2018   Reactive depression    Benign essential HTN    Acute postoperative anemia due to expected blood loss    Central cord syndrome at C6 level of cervical spinal cord (HCC) 11/29/2017   Allergy to alpha-gal 11/25/2016   SCC (squamous cell carcinoma) 04/08/2014    ONSET DATE: 08/15/2021 (fall with B hip fx); Initial injury was in 2019- quadraplegia due to central cord syndrome of C6 secondary to neck fracture.   REFERRING DIAG: Bilateral Hip fx; Left Open- s/p Left IM nail femoral on 08/16/2021; Right closed  THERAPY DIAG:  Muscle weakness (generalized)  Other lack of coordination  Central cord syndrome, sequela (HCC)  Other abnormalities of gait and mobility  Difficulty in walking, not elsewhere classified  Closed fracture of both hips, initial encounter (HCC)  Abnormality of gait and mobility  Rationale for Evaluation and Treatment Rehabilitation  SUBJECTIVE:   SUBJECTIVE STATEMENT:   Patient reports doing well overall and going to Prosthetist office for brace check-up later today.           Pt accompanied by:  Paid caregiver-   PERTINENT HISTORY: Sherry [name] is an 89yoF who experienced a fall at home on 08/15/2021 with hip fracture, s/p Left hip ORIF. PMH: quadriplegia due to central cord syndrome of C6 secondary to neck fracture, HTN, depression with anxiety, neurogenic bladder, bilateral DVT on Xarelto  (s/p of IVC filter placement). Prior history of significant PT/OT with improvement of function. Pt has full use of shoulder/elbows, limited use of hands, adaptive self feeding sucessful.     PAIN:  None current but does endorse some left thigh pain intermittent  OBJECTIVE:    TODAY'S TREATMENT:   05/25/23      Therex:  LAQ active 2 x 15 reps (physical assist on right to achieve full ROM)  Reclined in power wheelchair- hip abd 3 x 10 reps each LE SLR AAROM on each LE 3 x 10 reps Quad sets - hold  5 sec  2 sets of 10 reps Gluteal sets - Hold 5 sec 2 sets of 10 reps Seated hip march- AAROM on each 2 x 15 reps Reclined heel slide- AAROM on each 3 x 10 reps      PATIENT EDUCATION: Education details: Exercise technique Patient verbalized understanding.   HOME EXERCISE PROGRAM: No changes at this time  Access Code: [facility] URL: [email or web address] Date: 11/23/2021 Prepared by: Marina  Moser Exercises - Seated Gluteal Sets - 2 x daily - 7 x weekly - 2 sets - 10 reps - 5 hold - Seated Quad Set - 2 x daily - 7 x weekly - 2 sets - 10 reps - 5 hold - Seated Long Arc Quad - 2 x daily - 7 x weekly - 2 sets - 10 reps - 5 hold - Seated March - 2 x daily - 7 x weekly - 2 sets - 10 reps - 5 hold - Seated Hip Abduction - 2 x daily - 7 x weekly - 2 sets - 10 reps - 5 hold - Seated Shoulder Shrugs - 2 x daily - 7 x weekly - 2 sets - 10 reps - 5 hold - Wheelchair Pressure Relief - 2 x daily - 7 x weekly - 2 sets - 10 reps - 5 hold  GOALS: Goals reviewed with patient? Yes  SHORT TERM GOALS: Target date: 02/22/2022  Pt will be independent with initial UE  strengthening HEP in order to improve strength and balance in order to decrease fall risk and improve function at home and work. Baseline: 10/19/2021- patient with no formal UE HEP; 12/02/2021= Patient verbalized knowledge of HEP including use of theraband for UE strengthening.  Goal status: GOAL MET  LONG TERM GOALS: Target date: 06/20/2023  Pt will be independent with final for UE/LE HEP in order to improve strength and balance in order to decrease fall risk and improve function at home and work. Baseline: Patient is currently BLE NWB and unable to participate in HEP. Has order for UE strengthening. 01/11/2022- Patient now able to participate in LE strengthening although NWB still- good understanding for some basic exercises.  Will keep goal active to incorporate progressive LE strengthening exercises. 04/07/2022= Patient still participating in progressive LE seated HEP and has no questions at this time.  Goal status: MET  2.  Pt will improve FOTO to target score of 35  to display perceived improvements in ability to complete ADL's.  Baseline: 10/19/2021= 12; 12/02/2021= 17; 01/11/2022=17; 04/07/2022= 17; 07/05/2022=17; 10/06/2022- outcome measure not appropriate as patient in non-ambulatory and not consistently standing. Goal status: Goal not appropriate  3.  Pt will increase strength of B UE  by at least 1/2 MMT grade in order to demonstrate improvement in strength and function  Baseline: patient has range of 2-/5 to 4/5 BUE Strength; 12/02/2021= 4/5 except with wrist ext. 01/11/2022= 4/5 B UE strength expept for wrist Goal status: Goal revised-no longer appropriate- working with OT on all UE strengthening.   4. Pt. Will increase strength of RLE by at least 1/2 MMT grade in order to demonstrate improvement in Standing/transfers.  Baseline: 2-/5 Right hip flex/knee ext/flex; 04/07/2022= 2- with right hip flex/knee ext (lacking 28 deg from zero) 4/8= Patient able to ext right knee lacking 18 deg from zero.  07/05/2022=Left knee approx 8 deg from zero and right knee = 23 deg from zero; 12/29/2022 - Left LE = full ROM 4-/5 and right knee ext= 23 deg from zero= 2+/5 (gravity minimized); 03/28/2023= Left knee ext=  4/5 and right knee ext= 3-/5 (able to move lower leg through incomplete ROM -lacking 20 deg from full ROM against gravity); 05/23/2023= right knee ext= 3-/5 (able to move lower leg through incomplete ROM -lacking 17 deg from full ROM against gravity)  Goal status: Progressing  5. Pt. Will demo ability to stand pivot transfer with max assist for improved functional mobility and less dependent need on mechanical device.   Baseline: Dependent on hoyer lift for all transfers; 04/07/2022- Unable to test secondary to patient with recent UTI and right hand procedure - will attempt to reassess next visit. ; 04/12/22: Unable to successfully stand due to feet plates on loaned power w/c; 05/10/2022- Patient still having to use rental chair and has not received her original power w/c back to practice SPT. 07/04/2021= Patient just received new power w/c and attempted standing from 1st time today- Patient able to stand with max assist today from w/c and did not attempt to perform pivot transfer- difficulty with static standing and placing weight on right LE. 10/06/2022- Not assessed today- patient needs new AFO prior to attempting transfers; 12/29/2022= Patient has not attempted SPT yet - still trying to improve LE strength enough to stand well without knee buckling with weight shifting. 03/28/2023- not attempted today due to ill fitting brace but patient last performed SPT on 01/10/23 and was able to perform with Max A with feet pre-positioned to angle toward direction of transfer; 05/23/2023- Not tested today but on last visit- patient able to perform SPT with max assist yet concern for twisting knee as she was unable to turn/pivot foot .  Goal status: ONGOING  6. Pt. Will demonstrate improved functional LE strength as seen by  ability to stand > 2 min for improved transfer ability and pregait abilities.   Baseline: not assessed today due to recent dx: UTI; 04/07/2022- Unable to test secondary to patient with recent UTI and right hand procedure - will attempt to reassess next visit; 04/12/22: Unable to successfully stand due to feet plates on loaned power w/c 05/10/2022- Patient still having to use rental chair and has not received  her original power w/c back to practice Standing. 07/05/2022- Patient able to stand with max assist at trunk today for 2 min 3 sec. Will keep goal active to ensure consistency. 10/06/2022= Unable to assess today- patient with some recent blood pressure issues and needs new AFO. 11/24/2022= Patient demonstrated multiple rounds of standing in // bars- holding up to 1:45 min today but has been able to exhibit 2 min in recent past- working on consistency. 12/29/2022= 2 min 40 sec with patient demonstrating improved overall posture and ability to contract glutes and quads with just min assist at trunk today.   Goal status: MET  7. Pt. Will demonstrate improved functional LE strength as seen by ability to stand > 5 min for improved transfer ability and pregait abilities. Baseline: 12/29/2022= 2 min 40 sec with patient demonstrating improved overall posture and ability to contract glutes and quads with just min assist at trunk today. 03/28/2023= Attempted 2 trials today - at best 2 min 48 sec today but during cert- did stand for 3 min 44 sec.  Goal Status: Ongoing- Patient will likely receive new AFO in upcoming weeks and anticipate more standing time once received.  8. Patient will perform sit to stand transfer with moderate assist consistently > 75% of time for improved transfer ability and less dependence on caregiver.   Baseline: 12/29/2022- Patient currently max assist level assistance with all sit to stand activities. 03/28/2023= Patient is still majority of max assist to stand yet able to pull up from wheelchair  smoother in one motion.   Goal status: ONGOING   ASSESSMENT:  CLINICAL IMPRESSION:   Patient responded well to treatment today. Since brace still needs adjustments- treatment continued to focus on LE strengthening. Patient able to demo 3 sets of 10 reps today with most activities - some in reclined position requiring some active assistance. She reported very fatigued at end of session today. Pt will continue to benefit from skilled PT services to optimize independence and reduced caregiver support.   Will focus more on transfers, Sit to stand and standing once patient receives AFO.   OBJECTIVE IMPAIRMENTS Abnormal gait, decreased activity tolerance, decreased balance, decreased coordination, decreased endurance, decreased mobility, difficulty walking, decreased ROM, decreased strength, hypomobility, impaired flexibility, impaired UE functional use, postural dysfunction, and pain.   ACTIVITY LIMITATIONS carrying, lifting, bending, sitting, standing, squatting, sleeping, stairs, transfers, bed mobility, continence, bathing, toileting, dressing, self feeding, reach over head, hygiene/grooming, and caring for others  PARTICIPATION LIMITATIONS: meal prep, cleaning, laundry, medication management, personal finances, interpersonal relationship, driving, shopping, community activity, and yard work  PERSONAL FACTORS Age, Time since onset of injury/illness/exacerbation, and 1-2 comorbidities: HTN, cervical Sx  are also affecting patient's functional outcome.   REHAB POTENTIAL: Good  CLINICAL DECISION MAKING: Evolving/moderate complexity  EVALUATION COMPLEXITY: Moderate  PLAN: PT FREQUENCY: 1-2x/week  PT DURATION: 12 weeks  PLANNED INTERVENTIONS: Therapeutic exercises, Therapeutic activity, Neuromuscular re-education, Balance training, Gait training, Patient/Family education, Self Care, Joint mobilization, DME instructions, Dry Needling, Electrical stimulation, Wheelchair mobility training, Spinal  mobilization, Cryotherapy, Moist heat, and Manual therapy  PLAN FOR NEXT SESSION:  LE and core strength training, Sit to stand and static standing; progress to SPT as able.  Reassess next visit with new brace.   4:41 PM, 05/25/23 Ossie Blend, PT Physical Therapist - [facility]  Outpatient Physical Therapy- Main [facility] [phone]

## 2023-05-25 NOTE — Therapy (Addendum)
 Occupational Therapy Progress/Recertification Note  Dates of reporting period  04/04/23   to   05/25/23    Patient Name: Sherry [name] MRN: [id number] DOB:12-17-1934, 89 y.o., female Today's Date: 11/29/2022  PCP:  Waunita Haff PROVIDER: Valrie Gehrig, MD  END OF SESSION:   OT End of Session - 05/25/23 1107     Visit Number 120    Number of Visits 192    Date for OT Re-Evaluation 08/17/23    Authorization Time Period Progress report period starting 04/04/23    OT Start Time 1100    OT Stop Time 1145    OT Time Calculation (min) 45 min    Activity Tolerance Patient tolerated treatment well                     Past Medical History:  Diagnosis Date   Acute blood loss anemia    Arthritis    Cancer (HCC)    skin   Central cord syndrome at C6 level of cervical spinal cord (HCC) 11/29/2017   Hypertension    Protein-calorie malnutrition, severe (HCC) 01/24/2018   S/P insertion of IVC (inferior vena caval) filter 01/24/2018   Tetraparesis (HCC)    Past Surgical History:  Procedure Laterality Date   ANTERIOR CERVICAL DECOMP/DISCECTOMY FUSION N/A 11/29/2017   Procedure: Cervical five-six, six-seven Anterior Cervical Decompression Fusion;  Surgeon: Cannon Champion, MD;  Location: [facility] OR;  Service: Neurosurgery;  Laterality: N/A;  Cervical five-six, six-seven Anterior Cervical Decompression Fusion   CATARACT EXTRACTION     FEMUR IM NAIL Left 08/16/2021   Procedure: INTRAMEDULLARY (IM) NAIL FEMORAL;  Surgeon: Marlynn Singer, MD;  Location: [facility] ORS;  Service: Orthopedics;  Laterality: Left;   IR IVC FILTER PLMT / S&I /IMG GUID/MOD SED  12/08/2017   Patient Active Problem List   Diagnosis Date Noted   Age-related osteoporosis without current pathological fracture 07/27/2022   Mild recurrent major depression (HCC) 07/27/2022   Hypertensive heart disease with other congestive heart failure (HCC) 02/14/2022   Chronic indwelling Foley catheter 02/14/2022   Closed hip  fracture (HCC) 08/15/2021   DVT (deep venous thrombosis) (HCC) 08/15/2021   Quadriplegia (HCC) 08/15/2021   Fall 08/15/2021   Constipation due to slow transit 08/31/2018   Trauma 06/05/2018   Neuropathic pain 06/05/2018   Neurogenic bowel 06/05/2018   Vaginal yeast infection 01/30/2018   Healthcare-associated pneumonia 01/25/2018   Chronic allergic rhinitis 01/24/2018   Depression with anxiety 01/24/2018   UTI due to Klebsiella species 01/24/2018   Protein-calorie malnutrition, severe (HCC) 01/24/2018   S/P insertion of IVC (inferior vena caval) filter 01/24/2018   Tetraparesis (HCC) 01/20/2018   Neurogenic bladder 01/20/2018   Reactive depression    Benign essential HTN    Acute postoperative anemia due to expected blood loss    Central cord syndrome at C6 level of cervical spinal cord (HCC) 11/29/2017   Allergy to alpha-gal 11/25/2016   SCC (squamous cell carcinoma) 04/08/2014   ONSET DATE: 11/29/2017  REFERRING DIAG: Central Cord Syndrome at C6 of the Cervical Spinal Cord, Fall with Bilateral Closed Hip Fractures, with ORIF repair with Intramedullary Nailing of the Left Hip Fracture.   THERAPY DIAG:  Muscle weakness (generalized)  Other lack of coordination  Rationale for Evaluation and Treatment: Rehabilitation  SUBJECTIVE:  SUBJECTIVE STATEMENT:  Pt. reports that she wants to be able to pick up items that she drops on the floor. Pt accompanied by: self, Personal Care aide  PERTINENT HISTORY: Central Cord Syndrome at  C6 of the Cervical Spinal Cord, Fall with Bilateral Closed Hip Fractures, with ORIF repair with Intramedullary Nailing of the Left Hip Fracture.   PRECAUTIONS: None  WEIGHT BEARING RESTRICTIONS: No  PAIN:  Are you having pain? No pain, just shoulder stiffness   LIVING ENVIRONMENT: Lives with: [facility] Stairs: No Has following equipment at home: Wheelchair (power)  PLOF: Independent   PATIENT GOALS:  See below for  established goals  OBJECTIVE:  Note: Objective measures were completed at Evaluation unless otherwise noted.  HAND DOMINANCE: Right  ADLs: Caregiver assist with ADLs, and [facility]  MOBILITY STATUS: Uses a power w/c  ACTIVITY TOLERANCE: Activity tolerance: Fair  FUNCTIONAL OUTCOME MEASURES:  Measurements:   12/09/2021:   Shoulder flexion: R: 120(136), L: 138(150) Shoulder abduction: R: 108(120), L: 110(120) Elbow: R: 0-148, L: 0-146 Wrist flexion: R: 55(62), L: 78 Writ extension: R: 34(55), L: 3(4) RD: R: 12(20), L: 16(26) UD: R: 8(24), L: 6(18) Thumb radial abduction: R: 11(20), L: 8(20) Digit flexion to the [facility]: R:  2nd: 5cm(3.5cm), 3rd: 7.5 cm(5cm), 4th: 7cm(3cm), 5th: 6cm(5.5cm) L: 2nd: 8cm(8cm), 3rd: 8cm(8cm), 4th: 7.5cm(7cm), 5th: 7cm(7cm)   02/03/2022:   Shoulder flexion: R: 122(138), L: 148(154) Shoulder abduction: R: 109(125), L: 125(125 Elbow: R: 0-148, L: 0-146 Wrist flexion: R: 60(68), L: 79 Writ extension: R: 40(55), L: 3(10) RD: R: 14(24), L: 20(28) UD: R: 8(24), L: 6(18) Thumb radial abduction: R: 20(25), L: 8(20) Digit flexion to the [facility]: R:  2nd: 4.5cm(3cm), 3rd: 6.5cm(4.5cm), 4th: 6.5 cm(3cm), 5th: 4.5cm(5cm) L: 2nd: 8cm(8cm), 3rd: 8cm(7cm), 4th: 7.5cm(7cm), 5th: 6.5cm(6.5cm)   04/07/2022:   Shoulder flexion: R: 125(150), L: 160(160) Shoulder abduction: R: 109(125), L: 125(125 Elbow: R: 0-148, L: 0-146 Wrist flexion: R: 60(68), L: 79 Writ extension: R: 40(55), L: 5(18) RD: R: 14(24), L: 20(28) UD: R: 18(24), L: 8(18) Thumb radial abduction: R: 20(25), L: 8(20) Digit flexion to the [facility]: R:  2nd: 4.5 cm(4 cm), 3rd: 6.5cm(3cm), 4th: 7 cm (5cm), 5th: 5cm(5cm) L: 2nd: 8cm(8cm), 3rd: 8cm(7cm), 4th: 7.5cm(7cm), 5th: 7cm(7cm)   07/05/2022:   Shoulder flexion: R: 125(154), L: 160(160) Shoulder abduction: R: 95(130), L: 143(143) Elbow: R: 0-148, L: 0-146 Wrist flexion: R: 60(68), L: 79 Writ extension: R: 40(60), L: 0(12) RD: R: 10(24), L:  12(20) UD: R: 16(24), L: 8(16) Thumb radial abduction: R: 20(25), L: 8(20) Digit flexion to the [facility]: R:  2nd: 4 cm(4cm), 3rd: 6.5cm( 3.5cm), 4th: 7 cm (5cm), 5th: 6cm(5cm) L: 2nd: 8cm(8cm), 3rd: 8cm(7cm), 4th: 8cm(7cm), 5th: 7cm(7cm)   09/13/2022:   Shoulder flexion: R: 125(154), L: 160(160) Shoulder abduction: R: 100(130), L: 143(143) Elbow: R: 0-150, L: 0-150 Wrist flexion: R: 55(68), L: 79 Writ extension: R: 45(60), L: 0(12) RD: R: 10(24), L: 12(20) UD: R: 16(24), L: 8(16) Thumb radial abduction: R: 20(25), L: 8(20) Digit flexion to the [facility]: R:  2nd: 4 cm(4cm), 3rd: 6cm( 5cm), 4th: 6 cm (4cm), 5th: 5cm(5cm) L: 2nd: 7cm(cm), 3rd: 7cm(6cm), 4th: 7cm(6cm), 5th: 6cm(6cm)   10/27/2022   Shoulder flexion: R: 128(154), L: 160(160) Shoulder abduction: R: 105(130), L: 143(143) Elbow: R: 0-150, L: 0-150 Wrist flexion: R: 55(68), L: 79 Writ extension: R: 46(64), L: 0(20) RD: R: 10(20), L: 18(24) UD: R: 18(28), L: 8(20) Thumb radial abduction: R: 30(42), L:20(24) Digit flexion to the [facility]: R:  2nd: 4 cm(3cm), 3rd: 6cm( 6cm), 4th: 6 cm (5cm), 5th: 5cm(5cm) L: 2nd: 8cm(8cm), 3rd: 8cm(6cm), 4th: 8cm(7cm), 5th: 6cm(6cm)  01/19/2023  Shoulder flexion: R: 131(154), L:  160(160) Shoulder abduction: R: 110(130), L: 143(143) Elbow: R: 0-150, L: 0-150 Wrist flexion: R: 55(68), L: 79 Writ extension: R: 46(64), L: 0(20) RD: R: 10(20), L: 18(24) UD: R: 18(28), L: 14(20) Thumb radial abduction: R: 38(48), L:24(26) Thumb IP flexion: R: 35(50) L: 20(45) Digit flexion to the [facility]: R:  2nd: 3.5 cm(3cm), 3rd: 6cm( 6cm), 4th: 6 cm (5cm), 5th: 5cm(4.5cm) L: 2nd: 8cm(7.5cm), 3rd: 8cm(6cm), 4th: 7.5cm(6cm), 5th: 6cm(4.5cm)  04/04/2023  Shoulder flexion: R: 134(154), L: 160(160) Shoulder abduction: R: 114(130), L: 143(143) Elbow: R: WNL, L: WNL Wrist flexion: R: 55(68), L: 80 Writ extension: R: 46(64), L: -10(10) RD: R: 14(24), L: 20(24) UD: R: 20(30), L: 14(20) Thumb radial abduction: R: 38(48),  L:28(30) Thumb IP flexion: R: 40(50) L: 25(45) Digit flexion to the [facility]: R:  2nd: 3.5 cm(3cm), 3rd: 6cm( 5cm), 4th: 6 cm (4cm), 5th: 5cm(5cm) L: 2nd: 8cm(7.5cm), 3rd: 8cm(7cm), 4th: 7.5cm(7cm), 5th: 7cm(6cm)  05/25/2023  Shoulder flexion: R: 134(154), L: 160(160) Shoulder abduction: R: 117(134), L: 143(143) Elbow: R: WNL, L: WNL Wrist flexion: R: 55(68), L: 80 Wrist extension: R: 46(64), L: -8(14) RD: R: 14(24), L: 20(24) UD: R: 20(32), L: 14(22) Thumb radial abduction: R: 38(50), L:28(30) Thumb IP flexion: R: 40(50) L: 30(45) Digit flexion to the [facility]: R:  2nd: 3.5 cm(3cm), 3rd: 6cm( 5cm), 4th: 7 cm (4cm), 5th: 5cm(4cm) L: 2nd: 8cm(7.5cm), 3rd: 8cm(7cm), 4th: 7.5cm(7cm), 5th: 7cm(6cm)     COORDINATION:   01/19/2023:  9 hole peg test:  Right: 2 pegs placed in 1 min. & 5 sec.  Pt. was able to remove 9 pegs in 17 sec.  Left: 2 pegs placed in 1 min. & 10 sec. Pt. was able to remove 9 pegs in 20 sec.  04/04/23:  Pt. was able to consistently grasp the pegs however each one slipped out of her fingers when attempting to place them into the Pegboard.  05/25/2023:   Right: 9 pegs placed and removed in 2 min. & 41 sec. Left: Unable to grasp the pegs for a horizontal position.  SENSATION: Intact  EDEMA: N/A   COGNITION: Overall cognitive status: WNL   VISION: Subjective report: Wears glasses, No changes in vision.  PERCEPTION: Intact  TODAY'S TREATMENT:                                                                                                                              DATE:  05/25/23  Measurements were obtained, and goals were reviewed with the Pt.    PATIENT EDUCATION: Education details: Bilateral hand and shoulder ROM Person educated: Patient Education method: Explanation, Demonstration, and Verbal cues Education comprehension: verbalized understanding and returned demonstration  HOME EXERCISE PROGRAM: Continue to assess HEP needs, and provide as needed.    GOALS: Goals reviewed with patient? Yes  LONG TERM GOALS: Target date: 08/17/2023  3.  Patient will independently use a reacher to pick up items from various surfaces.  Baseline: 05/25/2023:  Pt. is  now able to initiate grasping a reacher to open and close the reacher with the right hand, however requires the grasp handle to be repositioned  within the hand. Pt. requires work on using/moving the reacher with the right hand through various planes. 04/04/2023: Pt. continues to work on improving Digit flexion to the [facility] to be able to efficiently handle Adaptive equipment. 01/19/2023: Pt. Is improving with Bilateral digit flexion to the [facility]. Pt. has difficulty firmly holding adaptive devices. 12/06/2022: Pt. continues to present with increased MP, PIP, and DIP digit tightness/stiffness limiting the formulation of bilateral composites fists. 10/27/2022: Pt. presents with increased MP, PIP, and DIP digit tightness/stiffness limiting the formulation of bilateral composites fists during this progress reporting period limiting. Digit flexion to the [facility]: R:  2nd: 4 cm(3cm), 3rd: 6cm( 6cm), 4th: 6 cm (5cm), 5th: 5cm(5cm), L: 2nd: 8cm(8cm), 3rd: 8cm(6cm), 4th: 8cm(7cm), 5th: 6cm(6cm) 09/20/2022: Improving digit flexion to the [facility] 09/13/22: improved digit flexion to the [facility]. 07/05/2022: Pt. presents with digit MP, PIP, and DIP extensor tightness limiting her ability to securely grip objects in her bilateral hands.  04/07/2022: Pt. Has improved with right 2nd, and 3rd digit flexion towards the [facility]. Pt. Continues to have difficulty securely holding and applying deodorant.02/03/2022: Pt. Has improved with digit flexion, however, continues to have difficulty securely holding and using deodorant. 12/09/2021: Bilateral hand/digit MP, PIP, and DIP extension tightness limits her ability achieve digit flexion to hold, and apply deodorant. 10/28/2021:  Pt. continues to have difficulty holding the deodorant. 01/12/2021: Pt. Presents with  limited digit extension. Pt. Is able to initiate holding deodorant, however is unable to hold it while using it  Goal status: Ongoing   4.  Pt. Will improve bilateral wrist extension in preparation for anticipating, and initiating reaching for objects at the table.  Baseline: 05/25/23: Wrist extension: R: 46(64), L: -8(14) 04/04/23: R: 46(64) L: -10(10)01/19/2023:  R: 46(64) L: 0(20) 12/06/2022: Pt continues to to progress with bilateral wrist extension in preparation fro functional reaching. 10/27/2022: Pt. Is improving with bilateral wrist extension. R: 46(64) L: 0(20) 09/20/2022: limited PROM in the Left wrist extension 2/2 tightness/stiffness. 09/13/22: R: 55(68), L: 0(12) 07/19/2022: Bilateral wrist extension in limited. 07/05/2022: Right: 40(60) Left: 0(12) 04/07/2022: Right: 40(55) Left: 5(18) 02/03/2022: Right: 34(55) Left: 3(4) 12/09/2021: Right  34(55)  Left  3(4) 10/28/2021:  Right 22(38), Left 0(15) 09/14/2021:  Right: 17(35), left 2(15)  Goal status: Progressing/Ongoing  7.  Pt. Will increase bilateral lateral pinch strength by 2 lbs to be able to securely grasp items during ADLs, and IADL tasks.  Baseline: 01/19/2023: Deferred as the Pinch meter is out for calibration.12/06/2022: Pt. Is able to more securely hold objects during ADLs/IADLs 10/27/2022: Pt. Is improving with holding items with her bilateral thumbs. (Pinch meter out for calibration) 09/20/2022: lateral pinch continues to be limited 09/13/22: R 6.5# L 4# 07/19/2022: TBD 07/05/2022: TBD 07/2022: NT-Pinch meter out for calibration. 02/03/2022: Right: 6#, Left: 4# 12/09/2021: Right: 6#, Left: 4#  Goal status: Deferred   9.  Pt. will complete plant care with modified independence.  Baseline:12/06/2022: Pt. Is independent with plant care in her husband's room, however is not able to access her plants due to furniture blocking access to them. 10/27/2022: Pt. Is able to water plants that are closer, and within reach. Pt. continues to have difficulty  reaching for thorough plant care. 09/20/2022: Pt. Continues to have difficulty reaching for thorough plant care. 09/13/22: continues to report intermittent difficulty 07/19/2022: Pt. Continues to be  able to water, and care for some of her plants. Pt. Has more difficulty with plants that are harder to reach. 07/05/2022: pt. Is able to water, and care for some of her plants. Pt. Has more difficulty with plants that are harder to reach. 04/07/2022: Pt. is now able to hold a cup and water her plants.02/03/2022: Pt. has difficulty caring for her plants.  Goal status:Achieved   10.  Pt. will demonstrate adaptive techniques to assist with the efficiency of self-dressing, or morning care tasks.  Baseline: 05/25/2023: Pt. continues to work on improving UE functioning to be able to efficiently use Adaptive equipment.  04/04/2023:  Pt. continues to work on improving UE functioning to be able to efficiently use Adaptive equipment. 01/19/2203: Pt. Is able to donn her jacket in reverse independently. Pt. requires assist from staff, as Pt. Reports staff have decreased time. 12/06/2022: Pt. requires assist from staff, as Pt. Reports staff have decreased time. 10/27/2022: Pt. is able to assist with initiating UE dressing. Pt. requires assist from personal, and staff care aides. 09/20/2022: Pt. continues to require assist form personal, and staff care aides. 09/13/22: MODA dressing, reports she is often rushed 07/19/2022: Pt. continues to require assist with self-dressing/morning care tasks. 07/05/2022: Continue with goal. 04/07/2022: Pt. Continues to require assist with the efficiency of self-dressing, and morning care tasks.02/03/2022: Pt. requires assist from caregivers 2/2 time limitations during morning care.  Goal status: Ongoing  11.  Pt. will improve BUE strength to improve ADL, and IADL functioning.  Baseline: 05/25/2023: shoulder flexion L 4/5, R 4-/5; shoulder abduction L 4+/5, R 4-/5. elbow flexion B 5/5, elbow extension 4+/5  01/19/2023: L 4/5, R 3+/5  04/04/2023: shoulder flexion L 4/5, R 3+/5; shoulder abduction L 4+/5, R 3+/5. elbow flexion B 5/5, elbow extension 4/5 01/19/2023: L 4/5, R 3+/5 12/06/2022: Continue 10/27/2022: shoulder flexion L 4/5, R 3+/5; shoulder abduction L 4+/5, R 3+/5. elbow flexion B 5/5, elbow extension 4/5 09/20/2022: Pt. Presents with limited BUE strength 09/13/22: shoulder flexion L 4/5, R 3/5; elbow flexion B 5/5, elbow extension 4-/5; shoulder abduction L 4+/5, R 3+/5. 07/19/2022: BUE strength continues to be limited. 07/05/2022: shoulder flexion: right 4-/5, abduction: 3+/5, elbow flexion: right: 5/5, left 5/5, extension: right: 4-/5, left 4-/5, wrist extension: right: 3-/5, left: 2-/5  Goal status: Ongoing  12.  Pt. will improve bilateral thumb radial abduction in order to be able to hold the grab bars while standing with PT  Baseline: 05/25/23: Thumb radial abduction: R: 38(50), L:28(30) 04/04/23: R: 38(48), L:28(30) 01/19/2023: R: 38(48) L: 24(26)12/06/2022: Pt. Continues to present  with limited thumb abduction, however is improving holding onto the parallel bars.10/27/2022: thumb radial abduction: R: 30(42), L: 20(24  Goal status: Ongoing  13.  Pt. Will be able to securely hold, and stabilize medication bottles at a tabletop surface when opening, and closing them.  Baseline: 01/19/2023: Pt. Is now able to securely hold medication bottles while opening them, and stabilizes them on surfaces while opening them. 12/06/2022: Pt is improving with securely stabilizing medication bottles. 10/27/2022: Pt. Stabilizes bottles against her torso when attempting to open, and close them   Goal status: Achieved  14: Pt. Will improve bilateral thumb IP flexion to improve active grasping patterns.    Baseline: 05/25/2023: Thumb IP flexion: R: 40(50) L: 30(45) 04/04/2023:  R: 40(50) L: 25(45) 01/19/2023: Right 35(50), Left: 20/45)    Goal status: Ongoing    15. Pt. Will improve bilateral FMC/speed, and dexterity. As  evidence by improved  scores on the 9 hole peg test.  Baseline: 05/25/2023: 9 Hole pegs test speed/dexterity: Right: 9 pegs placed and removed in 2 min. & 41 sec. Left: Unable to grasp the pegs for a horizontal position. 04/04/23: Pt. was able to consistently grasp the pegs however each one slipped out of her fingers when attempting to place them into the Pegboard. 01/19/2023: 2 pegs placed in 1 min. & 5 sec.  Pt. was able to remove 9 pegs in 17 sec.; Left: 2 pegs placed in 1 min. & 10 sec. Pt. was able to remove 9 pegs in 20 sec.   Goal status: Ongoing  ASSESSMENT:  CLINICAL IMPRESSION:  Pt. is making steady progress with BUE functioning. Pt. is consistently engaging her hands during more tasks throughout the day. Pt. has progressed with bilateral wrist extension, thumb abduction, and thumb IP flexion. Pt. has made tremendous progress with right hand FMC/speed/dexterity over this past progress/recertification period.  Pt. Is now able to start working more on handling A/E with the right hand. Pt. continues to benefit from OT services to work on impoving ROM, and UE strength in order to work towards increasing bilateral hand grasp on objects, and increasing engagement of the bilateral hands during ADLs, and IADL tasks.     PERFORMANCE DE PERFORMANCE DEFICITS: in functional skills including ADLs, IADLs, coordination, dexterity, ROM, strength, and UE functional use, cognitive skills including , and psychosocial skills including coping strategies and environmental adaptation.   IMPAIRMENTS: are limiting patient from ADLs, IADLs, and leisure.   CO-MORBIDITIES: may have co-morbidities  that affects occupational performance. Patient will benefit from skilled OT to address above impairments and improve overall function.  MODIFICATION OR ASSISTANCE TO COMPLETE EVALUATION: Min-Moderate modification of tasks or assist with assess necessary to complete an evaluation.  OT OCCUPATIONAL PROFILE AND HISTORY: Detailed  assessment: Review of records and additional review of physical, cognitive, psychosocial history related to current functional performance.  CLINICAL DECISION MAKING: Moderate - several treatment options, min-mod task modification necessary  REHAB POTENTIAL: Good for stated goals  PLAN:  OT FREQUENCY 2x's a week  OT DURATION: 12 weeks  PLANNED INTERVENTIONS ADL training, A/E training, UE ther. Ex, Manual therapy, neuromuscular re-education, moist heat modality, Paraffin Bath, Splinting, and  Pt./caregiver education   RECOMMENDED OTHER SERVICES: PT  CONSULTED AND AGREED WITH PLAN OF CARE: Patient  PLAN FOR NEXT SESSION: Continue Treatment as per established POC  Duey Ghent, MS, OTR/L   05/25/23, 11:07 AM

## 2023-05-30 ENCOUNTER — Ambulatory Visit: Payer: Medicare HMO | Admitting: Occupational Therapy

## 2023-05-30 ENCOUNTER — Ambulatory Visit: Payer: Medicare HMO

## 2023-05-30 DIAGNOSIS — M6281 Muscle weakness (generalized): Secondary | ICD-10-CM

## 2023-05-30 DIAGNOSIS — R2689 Other abnormalities of gait and mobility: Secondary | ICD-10-CM

## 2023-05-30 DIAGNOSIS — S14129S Central cord syndrome at unspecified level of cervical spinal cord, sequela: Secondary | ICD-10-CM

## 2023-05-30 DIAGNOSIS — R278 Other lack of coordination: Secondary | ICD-10-CM

## 2023-05-30 DIAGNOSIS — R262 Difficulty in walking, not elsewhere classified: Secondary | ICD-10-CM

## 2023-05-30 NOTE — Therapy (Signed)
 OUTPATIENT PHYSICAL THERAPY NEURO TREATMENT   Patient Name: Sherry [name] MRN: [id number] DOB:04/10/1934, 89 y.o., female Today's Date: 05/30/2023   PCP: Valrie Gehrig, MD REFERRING PROVIDER: Marlynn Singer   PT End of Session - 05/30/23 1237     Visit Number 112    Number of Visits 124    Date for PT Re-Evaluation 06/20/23    Progress Note Due on Visit 120    PT Start Time 1145    PT Stop Time 1230    PT Time Calculation (min) 45 min    Equipment Utilized During Treatment Gait belt    Activity Tolerance Patient tolerated treatment well;No increased pain    Behavior During Therapy WFL for tasks assessed/performed                       Past Medical History:  Diagnosis Date   Acute blood loss anemia    Arthritis    Cancer (HCC)    skin   Central cord syndrome at C6 level of cervical spinal cord (HCC) 11/29/2017   Hypertension    Protein-calorie malnutrition, severe (HCC) 01/24/2018   S/P insertion of IVC (inferior vena caval) filter 01/24/2018   Tetraparesis (HCC)    Past Surgical History:  Procedure Laterality Date   ANTERIOR CERVICAL DECOMP/DISCECTOMY FUSION N/A 11/29/2017   Procedure: Cervical five-six, six-seven Anterior Cervical Decompression Fusion;  Surgeon: Cannon Champion, MD;  Location: [facility] OR;  Service: Neurosurgery;  Laterality: N/A;  Cervical five-six, six-seven Anterior Cervical Decompression Fusion   CATARACT EXTRACTION     FEMUR IM NAIL Left 08/16/2021   Procedure: INTRAMEDULLARY (IM) NAIL FEMORAL;  Surgeon: Marlynn Singer, MD;  Location: [facility] ORS;  Service: Orthopedics;  Laterality: Left;   IR IVC FILTER PLMT / S&I /IMG GUID/MOD SED  12/08/2017   Patient Active Problem List   Diagnosis Date Noted   Age-related osteoporosis without current pathological fracture 07/27/2022   Mild recurrent major depression (HCC) 07/27/2022   Hypertensive heart disease with other congestive heart failure (HCC) 02/14/2022   Chronic indwelling Foley  catheter 02/14/2022   Closed hip fracture (HCC) 08/15/2021   DVT (deep venous thrombosis) (HCC) 08/15/2021   Quadriplegia (HCC) 08/15/2021   Fall 08/15/2021   Constipation due to slow transit 08/31/2018   Trauma 06/05/2018   Neuropathic pain 06/05/2018   Neurogenic bowel 06/05/2018   Vaginal yeast infection 01/30/2018   Healthcare-associated pneumonia 01/25/2018   Chronic allergic rhinitis 01/24/2018   Depression with anxiety 01/24/2018   UTI due to Klebsiella species 01/24/2018   Protein-calorie malnutrition, severe (HCC) 01/24/2018   S/P insertion of IVC (inferior vena caval) filter 01/24/2018   Tetraparesis (HCC) 01/20/2018   Neurogenic bladder 01/20/2018   Reactive depression    Benign essential HTN    Acute postoperative anemia due to expected blood loss    Central cord syndrome at C6 level of cervical spinal cord (HCC) 11/29/2017   Allergy to alpha-gal 11/25/2016   SCC (squamous cell carcinoma) 04/08/2014    ONSET DATE: 08/15/2021 (fall with B hip fx); Initial injury was in 2019- quadraplegia due to central cord syndrome of C6 secondary to neck fracture.   REFERRING DIAG: Bilateral Hip fx; Left Open- s/p Left IM nail femoral on 08/16/2021; Right closed  THERAPY DIAG:  Muscle weakness (generalized)  Other lack of coordination  Central cord syndrome, sequela (HCC)  Other abnormalities of gait and mobility  Difficulty in walking, not elsewhere classified  Rationale for Evaluation and Treatment Rehabilitation  SUBJECTIVE:  SUBJECTIVE STATEMENT:   Patient reports doing well overall and going to Prosthetist office for brace check-up later today.          Pt accompanied by:  Paid caregiver-   PERTINENT HISTORY: Sherry [name] is an 87yoF who experienced a fall at home on 08/15/2021 with hip fracture, s/p Left hip ORIF. PMH: quadriplegia due to central cord syndrome of C6 secondary to neck fracture, HTN, depression with anxiety, neurogenic bladder, bilateral DVT on  Xarelto  (s/p of IVC filter placement). Prior history of significant PT/OT with improvement of function. Pt has full use of shoulder/elbows, limited use of hands, adaptive self feeding sucessful.     PAIN:  None current but does endorse some left thigh pain intermittent  OBJECTIVE:   TODAY'S TREATMENT:   05/30/23   Sit up from 30 degrees 1x15 Seated Row + scapular retraction 1x15 (manually resisted at posterior elbows)  Sit up from 30 degrees 1x15 2000g ball  Seated Row + scapular retraction 1x15 (manually resisted at posterior elbows)  Seated seadlift, 2lb AW each wrist x10   Seated chest press PVC (manually resisted x15)  Seated single arm row cross body + trunk rotation resisted x10 bilat  Seated chest press PVC (manually resisted x15)  Seated single arm row cross body + trunk rotation resisted x10 bilat    Manually resisted hip extension from WC 2x10 bilat -Manually assisted RLE LAQ 2x10, A/ROM Left LAQ 2x10     PATIENT EDUCATION: Education details: Exercise technique Patient verbalized understanding.   HOME EXERCISE PROGRAM: No changes at this time  Access Code: [facility] URL: [email or web address] Date: 11/23/2021 Prepared by: Marina  Moser Exercises - Seated Gluteal Sets - 2 x daily - 7 x weekly - 2 sets - 10 reps - 5 hold - Seated Quad Set - 2 x daily - 7 x weekly - 2 sets - 10 reps - 5 hold - Seated Long Arc Quad - 2 x daily - 7 x weekly - 2 sets - 10 reps - 5 hold - Seated March - 2 x daily - 7 x weekly - 2 sets - 10 reps - 5 hold - Seated Hip Abduction - 2 x daily - 7 x weekly - 2 sets - 10 reps - 5 hold - Seated Shoulder Shrugs - 2 x daily - 7 x weekly - 2 sets - 10 reps - 5 hold - Wheelchair Pressure Relief - 2 x daily - 7 x weekly - 2 sets - 10 reps - 5 hold  ASSESSMENT:   GOALS: Goals reviewed with patient? Yes  SHORT TERM GOALS: Target date: 02/22/2022  Pt will be independent with initial UE strengthening HEP in order to improve strength and  balance in order to decrease fall risk and improve function at home and work. Baseline: 10/19/2021- patient with no formal UE HEP; 12/02/2021= Patient verbalized knowledge of HEP including use of theraband for UE strengthening.  Goal status: GOAL MET  LONG TERM GOALS: Target date: 06/20/2023  Pt will be independent with final for UE/LE HEP in order to improve strength and balance in order to decrease fall risk and improve function at home and work. Baseline: Patient is currently BLE NWB and unable to participate in HEP. Has order for UE strengthening. 01/11/2022- Patient now able to participate in LE strengthening although NWB still- good understanding for some basic exercises. Will keep goal active to incorporate progressive LE strengthening exercises. 04/07/2022= Patient still participating in progressive LE seated HEP and has no questions at this  time.  Goal status: MET  2.  Pt will improve FOTO to target score of 35  to display perceived improvements in ability to complete ADL's.  Baseline: 10/19/2021= 12; 12/02/2021= 17; 01/11/2022=17; 04/07/2022= 17; 07/05/2022=17; 10/06/2022- outcome measure not appropriate as patient in non-ambulatory and not consistently standing. Goal status: Goal not appropriate  3.  Pt will increase strength of B UE  by at least 1/2 MMT grade in order to demonstrate improvement in strength and function  Baseline: patient has range of 2-/5 to 4/5 BUE Strength; 12/02/2021= 4/5 except with wrist ext. 01/11/2022= 4/5 B UE strength expept for wrist Goal status: Goal revised-no longer appropriate- working with OT on all UE strengthening.   4. Pt. Will increase strength of RLE by at least 1/2 MMT grade in order to demonstrate improvement in Standing/transfers.  Baseline: 2-/5 Right hip flex/knee ext/flex; 04/07/2022= 2- with right hip flex/knee ext (lacking 28 deg from zero) 4/8= Patient able to ext right knee lacking 18 deg from zero. 07/05/2022=Left knee approx 8 deg from zero and right knee  = 23 deg from zero; 12/29/2022 - Left LE = full ROM 4-/5 and right knee ext= 23 deg from zero= 2+/5 (gravity minimized); 03/28/2023= Left knee ext=  4/5 and right knee ext= 3-/5 (able to move lower leg through incomplete ROM -lacking 20 deg from full ROM against gravity); 05/23/2023= right knee ext= 3-/5 (able to move lower leg through incomplete ROM -lacking 17 deg from full ROM against gravity)  Goal status: Progressing  5. Pt. Will demo ability to stand pivot transfer with max assist for improved functional mobility and less dependent need on mechanical device.   Baseline: Dependent on hoyer lift for all transfers; 04/07/2022- Unable to test secondary to patient with recent UTI and right hand procedure - will attempt to reassess next visit. ; 04/12/22: Unable to successfully stand due to feet plates on loaned power w/c; 05/10/2022- Patient still having to use rental chair and has not received her original power w/c back to practice SPT. 07/04/2021= Patient just received new power w/c and attempted standing from 1st time today- Patient able to stand with max assist today from w/c and did not attempt to perform pivot transfer- difficulty with static standing and placing weight on right LE. 10/06/2022- Not assessed today- patient needs new AFO prior to attempting transfers; 12/29/2022= Patient has not attempted SPT yet - still trying to improve LE strength enough to stand well without knee buckling with weight shifting. 03/28/2023- not attempted today due to ill fitting brace but patient last performed SPT on 01/10/23 and was able to perform with Max A with feet pre-positioned to angle toward direction of transfer; 05/23/2023- Not tested today but on last visit- patient able to perform SPT with max assist yet concern for twisting knee as she was unable to turn/pivot foot .  Goal status: ONGOING  6. Pt. Will demonstrate improved functional LE strength as seen by ability to stand > 2 min for improved transfer ability and  pregait abilities.   Baseline: not assessed today due to recent dx: UTI; 04/07/2022- Unable to test secondary to patient with recent UTI and right hand procedure - will attempt to reassess next visit; 04/12/22: Unable to successfully stand due to feet plates on loaned power w/c 05/10/2022- Patient still having to use rental chair and has not received her original power w/c back to practice Standing. 07/05/2022- Patient able to stand with max assist at trunk today for 2 min 3 sec. Will  keep goal active to ensure consistency. 10/06/2022= Unable to assess today- patient with some recent blood pressure issues and needs new AFO. 11/24/2022= Patient demonstrated multiple rounds of standing in // bars- holding up to 1:45 min today but has been able to exhibit 2 min in recent past- working on consistency. 12/29/2022= 2 min 40 sec with patient demonstrating improved overall posture and ability to contract glutes and quads with just min assist at trunk today.   Goal status: MET  7. Pt. Will demonstrate improved functional LE strength as seen by ability to stand > 5 min for improved transfer ability and pregait abilities. Baseline: 12/29/2022= 2 min 40 sec with patient demonstrating improved overall posture and ability to contract glutes and quads with just min assist at trunk today. 03/28/2023= Attempted 2 trials today - at best 2 min 48 sec today but during cert- did stand for 3 min 44 sec.  Goal Status: Ongoing- Patient will likely receive new AFO in upcoming weeks and anticipate more standing time once received.  8. Patient will perform sit to stand transfer with moderate assist consistently > 75% of time for improved transfer ability and less dependence on caregiver.   Baseline: 12/29/2022- Patient currently max assist level assistance with all sit to stand activities. 03/28/2023= Patient is still majority of max assist to stand yet able to pull up from wheelchair smoother in one motion.   Goal status:  ONGOING   ASSESSMENT:  CLINICAL IMPRESSION:   Continued with focal trunk strengthening of core, trunk, BLE. Pt follows cues well. Integrated hands into exercises when able, making modification for hand contractures. DTR attends session throughout. Pt continues to show great progress in strength in general with RLE remaining the weakest.   OBJECTIVE IMPAIRMENTS Abnormal gait, decreased activity tolerance, decreased balance, decreased coordination, decreased endurance, decreased mobility, difficulty walking, decreased ROM, decreased strength, hypomobility, impaired flexibility, impaired UE functional use, postural dysfunction, and pain.   ACTIVITY LIMITATIONS carrying, lifting, bending, sitting, standing, squatting, sleeping, stairs, transfers, bed mobility, continence, bathing, toileting, dressing, self feeding, reach over head, hygiene/grooming, and caring for others  PARTICIPATION LIMITATIONS: meal prep, cleaning, laundry, medication management, personal finances, interpersonal relationship, driving, shopping, community activity, and yard work  PERSONAL FACTORS Age, Time since onset of injury/illness/exacerbation, and 1-2 comorbidities: HTN, cervical Sx  are also affecting patient's functional outcome.   REHAB POTENTIAL: Good  CLINICAL DECISION MAKING: Evolving/moderate complexity  EVALUATION COMPLEXITY: Moderate  PLAN: PT FREQUENCY: 1-2x/week  PT DURATION: 12 weeks  PLANNED INTERVENTIONS: Therapeutic exercises, Therapeutic activity, Neuromuscular re-education, Balance training, Gait training, Patient/Family education, Self Care, Joint mobilization, DME instructions, Dry Needling, Electrical stimulation, Wheelchair mobility training, Spinal mobilization, Cryotherapy, Moist heat, and Manual therapy  PLAN FOR NEXT SESSION:  LE and core strength training, Sit to stand and static standing; progress to SPT as able.  Reassess next visit with new brace.   12:40 PM, 05/30/23  12:41 PM,  05/30/23 Dawn Eth, PT, DPT Physical Therapist - [facility] [facility]  Outpatient Physical Therapy- [facility] [phone]

## 2023-05-30 NOTE — Therapy (Signed)
 Occupational Therapy Progress Note  Dates of reporting period  04/04/23   to   05/25/23    Patient Name: Sherry [name] MRN: [id number] DOB:October 29, 1934, 89 y.o., female Today's Date: 11/29/2022  PCP:  Waunita Haff PROVIDER: Valrie Gehrig, MD  END OF SESSION:   OT End of Session - 05/30/23 1437     Visit Number 121    Number of Visits 192    Date for OT Re-Evaluation 08/17/23    OT Start Time 1102    OT Stop Time 1145    OT Time Calculation (min) 43 min    Equipment Utilized During Treatment powered wheelchair    Activity Tolerance Patient tolerated treatment well    Behavior During Therapy WFL for tasks assessed/performed                         Past Medical History:  Diagnosis Date   Acute blood loss anemia    Arthritis    Cancer (HCC)    skin   Central cord syndrome at C6 level of cervical spinal cord (HCC) 11/29/2017   Hypertension    Protein-calorie malnutrition, severe (HCC) 01/24/2018   S/P insertion of IVC (inferior vena caval) filter 01/24/2018   Tetraparesis (HCC)    Past Surgical History:  Procedure Laterality Date   ANTERIOR CERVICAL DECOMP/DISCECTOMY FUSION N/A 11/29/2017   Procedure: Cervical five-six, six-seven Anterior Cervical Decompression Fusion;  Surgeon: Cannon Champion, MD;  Location: [facility] OR;  Service: Neurosurgery;  Laterality: N/A;  Cervical five-six, six-seven Anterior Cervical Decompression Fusion   CATARACT EXTRACTION     FEMUR IM NAIL Left 08/16/2021   Procedure: INTRAMEDULLARY (IM) NAIL FEMORAL;  Surgeon: Marlynn Singer, MD;  Location: [facility] ORS;  Service: Orthopedics;  Laterality: Left;   IR IVC FILTER PLMT / S&I /IMG GUID/MOD SED  12/08/2017   Patient Active Problem List   Diagnosis Date Noted   Age-related osteoporosis without current pathological fracture 07/27/2022   Mild recurrent major depression (HCC) 07/27/2022   Hypertensive heart disease with other congestive heart failure (HCC) 02/14/2022   Chronic indwelling  Foley catheter 02/14/2022   Closed hip fracture (HCC) 08/15/2021   DVT (deep venous thrombosis) (HCC) 08/15/2021   Quadriplegia (HCC) 08/15/2021   Fall 08/15/2021   Constipation due to slow transit 08/31/2018   Trauma 06/05/2018   Neuropathic pain 06/05/2018   Neurogenic bowel 06/05/2018   Vaginal yeast infection 01/30/2018   Healthcare-associated pneumonia 01/25/2018   Chronic allergic rhinitis 01/24/2018   Depression with anxiety 01/24/2018   UTI due to Klebsiella species 01/24/2018   Protein-calorie malnutrition, severe (HCC) 01/24/2018   S/P insertion of IVC (inferior vena caval) filter 01/24/2018   Tetraparesis (HCC) 01/20/2018   Neurogenic bladder 01/20/2018   Reactive depression    Benign essential HTN    Acute postoperative anemia due to expected blood loss    Central cord syndrome at C6 level of cervical spinal cord (HCC) 11/29/2017   Allergy to alpha-gal 11/25/2016   SCC (squamous cell carcinoma) 04/08/2014   ONSET DATE: 11/29/2017  REFERRING DIAG: Central Cord Syndrome at C6 of the Cervical Spinal Cord, Fall with Bilateral Closed Hip Fractures, with ORIF repair with Intramedullary Nailing of the Left Hip Fracture.   THERAPY DIAG:  Muscle weakness (generalized)  Other lack of coordination  Rationale for Evaluation and Treatment: Rehabilitation  SUBJECTIVE:  SUBJECTIVE STATEMENT:  Pt. reports that she had family visiting from [place] .   Pt accompanied by: self, Personal Care aide  PERTINENT  HISTORY: Central Cord Syndrome at C6 of the Cervical Spinal Cord, Fall with Bilateral Closed Hip Fractures, with ORIF repair with Intramedullary Nailing of the Left Hip Fracture.   PRECAUTIONS: None  WEIGHT BEARING RESTRICTIONS: No  PAIN:  Are you having pain? No pain, just shoulder stiffness   LIVING ENVIRONMENT: Lives with: [facility] Stairs: No Has following equipment at home: Wheelchair (power)  PLOF: Independent   PATIENT GOALS:  See  below for established goals  OBJECTIVE:  Note: Objective measures were completed at Evaluation unless otherwise noted.  HAND DOMINANCE: Right  ADLs: Caregiver assist with ADLs, and [facility]  MOBILITY STATUS: Uses a power w/c  ACTIVITY TOLERANCE: Activity tolerance: Fair  FUNCTIONAL OUTCOME MEASURES:  Measurements:   12/09/2021:   Shoulder flexion: R: 120(136), L: 138(150) Shoulder abduction: R: 108(120), L: 110(120) Elbow: R: 0-148, L: 0-146 Wrist flexion: R: 55(62), L: 78 Writ extension: R: 34(55), L: 3(4) RD: R: 12(20), L: 16(26) UD: R: 8(24), L: 6(18) Thumb radial abduction: R: 11(20), L: 8(20) Digit flexion to the [facility]: R:  2nd: 5cm(3.5cm), 3rd: 7.5 cm(5cm), 4th: 7cm(3cm), 5th: 6cm(5.5cm) L: 2nd: 8cm(8cm), 3rd: 8cm(8cm), 4th: 7.5cm(7cm), 5th: 7cm(7cm)   02/03/2022:   Shoulder flexion: R: 122(138), L: 148(154) Shoulder abduction: R: 109(125), L: 125(125 Elbow: R: 0-148, L: 0-146 Wrist flexion: R: 60(68), L: 79 Writ extension: R: 40(55), L: 3(10) RD: R: 14(24), L: 20(28) UD: R: 8(24), L: 6(18) Thumb radial abduction: R: 20(25), L: 8(20) Digit flexion to the [facility]: R:  2nd: 4.5cm(3cm), 3rd: 6.5cm(4.5cm), 4th: 6.5 cm(3cm), 5th: 4.5cm(5cm) L: 2nd: 8cm(8cm), 3rd: 8cm(7cm), 4th: 7.5cm(7cm), 5th: 6.5cm(6.5cm)   04/07/2022:   Shoulder flexion: R: 125(150), L: 160(160) Shoulder abduction: R: 109(125), L: 125(125 Elbow: R: 0-148, L: 0-146 Wrist flexion: R: 60(68), L: 79 Writ extension: R: 40(55), L: 5(18) RD: R: 14(24), L: 20(28) UD: R: 18(24), L: 8(18) Thumb radial abduction: R: 20(25), L: 8(20) Digit flexion to the [facility]: R:  2nd: 4.5 cm(4 cm), 3rd: 6.5cm(3cm), 4th: 7 cm (5cm), 5th: 5cm(5cm) L: 2nd: 8cm(8cm), 3rd: 8cm(7cm), 4th: 7.5cm(7cm), 5th: 7cm(7cm)   07/05/2022:   Shoulder flexion: R: 125(154), L: 160(160) Shoulder abduction: R: 95(130), L: 143(143) Elbow: R: 0-148, L: 0-146 Wrist flexion: R: 60(68), L: 79 Writ extension: R: 40(60), L: 0(12) RD: R:  10(24), L: 12(20) UD: R: 16(24), L: 8(16) Thumb radial abduction: R: 20(25), L: 8(20) Digit flexion to the [facility]: R:  2nd: 4 cm(4cm), 3rd: 6.5cm( 3.5cm), 4th: 7 cm (5cm), 5th: 6cm(5cm) L: 2nd: 8cm(8cm), 3rd: 8cm(7cm), 4th: 8cm(7cm), 5th: 7cm(7cm)   09/13/2022:   Shoulder flexion: R: 125(154), L: 160(160) Shoulder abduction: R: 100(130), L: 143(143) Elbow: R: 0-150, L: 0-150 Wrist flexion: R: 55(68), L: 79 Writ extension: R: 45(60), L: 0(12) RD: R: 10(24), L: 12(20) UD: R: 16(24), L: 8(16) Thumb radial abduction: R: 20(25), L: 8(20) Digit flexion to the [facility]: R:  2nd: 4 cm(4cm), 3rd: 6cm( 5cm), 4th: 6 cm (4cm), 5th: 5cm(5cm) L: 2nd: 7cm(cm), 3rd: 7cm(6cm), 4th: 7cm(6cm), 5th: 6cm(6cm)   10/27/2022   Shoulder flexion: R: 128(154), L: 160(160) Shoulder abduction: R: 105(130), L: 143(143) Elbow: R: 0-150, L: 0-150 Wrist flexion: R: 55(68), L: 79 Writ extension: R: 46(64), L: 0(20) RD: R: 10(20), L: 18(24) UD: R: 18(28), L: 8(20) Thumb radial abduction: R: 30(42), L:20(24) Digit flexion to the [facility]: R:  2nd: 4 cm(3cm), 3rd: 6cm( 6cm), 4th: 6 cm (5cm), 5th: 5cm(5cm) L: 2nd: 8cm(8cm), 3rd: 8cm(6cm), 4th: 8cm(7cm), 5th: 6cm(6cm)  01/19/2023  Shoulder flexion: R: 131(154), L: 160(160) Shoulder abduction: R: 110(130), L: 143(143) Elbow: R: 0-150, L: 0-150 Wrist flexion: R: 55(68), L: 79 Writ extension: R: 46(64), L: 0(20) RD: R: 10(20), L: 18(24) UD: R: 18(28), L: 14(20) Thumb radial abduction: R: 38(48), L:24(26) Thumb IP flexion: R: 35(50) L: 20(45) Digit flexion to the [facility]: R:  2nd: 3.5 cm(3cm), 3rd: 6cm( 6cm), 4th: 6 cm (5cm), 5th: 5cm(4.5cm) L: 2nd: 8cm(7.5cm), 3rd: 8cm(6cm), 4th: 7.5cm(6cm), 5th: 6cm(4.5cm)  04/04/2023  Shoulder flexion: R: 134(154), L: 160(160) Shoulder abduction: R: 114(130), L: 143(143) Elbow: R: WNL, L: WNL Wrist flexion: R: 55(68), L: 80 Writ extension: R: 46(64), L: -10(10) RD: R: 14(24), L: 20(24) UD: R: 20(30), L: 14(20) Thumb radial abduction:  R: 38(48), L:28(30) Thumb IP flexion: R: 40(50) L: 25(45) Digit flexion to the [facility]: R:  2nd: 3.5 cm(3cm), 3rd: 6cm( 5cm), 4th: 6 cm (4cm), 5th: 5cm(5cm) L: 2nd: 8cm(7.5cm), 3rd: 8cm(7cm), 4th: 7.5cm(7cm), 5th: 7cm(6cm)  05/25/2023  Shoulder flexion: R: 134(154), L: 160(160) Shoulder abduction: R: 117(134), L: 143(143) Elbow: R: WNL, L: WNL Wrist flexion: R: 55(68), L: 80 Wrist extension: R: 46(64), L: -8(14) RD: R: 14(24), L: 20(24) UD: R: 20(32), L: 14(22) Thumb radial abduction: R: 38(50), L:28(30) Thumb IP flexion: R: 40(50) L: 30(45) Digit flexion to the [facility]: R:  2nd: 3.5 cm(3cm), 3rd: 6cm( 5cm), 4th: 7 cm (4cm), 5th: 5cm(4cm) L: 2nd: 8cm(7.5cm), 3rd: 8cm(7cm), 4th: 7.5cm(7cm), 5th: 7cm(6cm)  COORDINATION:   01/19/2023:  9 hole peg test:  Right: 2 pegs placed in 1 min. & 5 sec.  Pt. was able to remove 9 pegs in 17 sec.  Left: 2 pegs placed in 1 min. & 10 sec. Pt. was able to remove 9 pegs in 20 sec.  04/04/23:  Pt. was able to consistently grasp the pegs however each one slipped out of her fingers when attempting to place them into the Pegboard.  05/25/2023:   Right: 9 pegs placed and removed in 2 min. & 41 sec. Left: Unable to grasp the pegs for a horizontal position.  SENSATION: Intact  EDEMA: N/A   COGNITION: Overall cognitive status: WNL   VISION: Subjective report: Wears glasses, No changes in vision.  PERCEPTION: Intact  TODAY'S TREATMENT:                                                                                                                              DATE:  05/30/23  Moist heat for Bilateral shoulder shoulders, and bilateral hands for muscle relaxation used simultaneous to passive stretching noted below  Therapeutic Ex.:  -Pt. tolerated PROM followed by AROM bilateral wrist extension, PROM for bilateral digit MP, PIP, and DIP flexion, and extension, thumb abduction.   Self-care:   -Assessed reacher use with the right hand for reaching  for items at multiple angles.  -Pt. Education was provided about reacher use for LE dressing, donning pants, managing velcro on shoes socks.  -Assessed dressing stick use  for retrieving items.  Neuromuscular re-education:   -Facilitated right hand [facility] skills sliding 1" grooved pegs off the edge of a flat surface to secure them in her hand in preparation for turning, and placing them in the grooved pegboard slots.      PATIENT EDUCATION: Education details: Bilateral hand and shoulder ROM Person educated: Patient Education method: Explanation, Demonstration, and Verbal cues Education comprehension: verbalized understanding and returned demonstration  HOME EXERCISE PROGRAM: Continue to assess HEP needs, and provide as needed.   GOALS: Goals reviewed with patient? Yes  LONG TERM GOALS: Target date: 08/17/2023  3.  Patient will independently use a reacher to pick up items from various surfaces.  Baseline: 05/25/2023:  Pt. is now able to initiate grasping a reacher to open and close the reacher with the right hand, however requires the grasp handle to be repositioned  within the hand. Pt. requires work on using/moving the reacher with the right hand through various planes. 04/04/2023: Pt. continues to work on improving Digit flexion to the [facility] to be able to efficiently handle Adaptive equipment. 01/19/2023: Pt. Is improving with Bilateral digit flexion to the [facility]. Pt. has difficulty firmly holding adaptive devices. 12/06/2022: Pt. continues to present with increased MP, PIP, and DIP digit tightness/stiffness limiting the formulation of bilateral composites fists. 10/27/2022: Pt. presents with increased MP, PIP, and DIP digit tightness/stiffness limiting the formulation of bilateral composites fists during this progress reporting period limiting. Digit flexion to the [facility]: R:  2nd: 4 cm(3cm), 3rd: 6cm( 6cm), 4th: 6 cm (5cm), 5th: 5cm(5cm), L: 2nd: 8cm(8cm), 3rd: 8cm(6cm), 4th: 8cm(7cm), 5th: 6cm(6cm)  09/20/2022: Improving digit flexion to the [facility]. 09/13/22: improved digit flexion to the [facility]. 07/05/2022: Pt. presents with digit MP, PIP, and DIP extensor tightness limiting her ability to securely grip objects in her bilateral hands.  04/07/2022: Pt. Has improved with right 2nd, and 3rd digit flexion towards the [facility]. Pt. Continues to have difficulty securely holding and applying deodorant.02/03/2022: Pt. Has improved with digit flexion, however, continues to have difficulty securely holding and using deodorant. 12/09/2021: Bilateral hand/digit MP, PIP, and DIP extension tightness limits her ability achieve digit flexion to hold, and apply deodorant. 10/28/2021:  Pt. continues to have difficulty holding the deodorant. 01/12/2021: Pt. Presents with limited digit extension. Pt. Is able to initiate holding deodorant, however is unable to hold it while using it  Goal status: Ongoing   4.  Pt. Will improve bilateral wrist extension in preparation for anticipating, and initiating reaching for objects at the table.  Baseline: 05/25/23: Wrist extension: R: 46(64), L: -8(14) 04/04/23: R: 46(64) L: -10(10)01/19/2023:  R: 46(64) L: 0(20) 12/06/2022: Pt continues to to progress with bilateral wrist extension in preparation fro functional reaching. 10/27/2022: Pt. Is improving with bilateral wrist extension. R: 46(64) L: 0(20) 09/20/2022: limited PROM in the Left wrist extension 2/2 tightness/stiffness. 09/13/22: R: 55(68), L: 0(12) 07/19/2022: Bilateral wrist extension in limited. 07/05/2022: Right: 40(60) Left: 0(12) 04/07/2022: Right: 40(55) Left: 5(18) 02/03/2022: Right: 34(55) Left: 3(4) 12/09/2021: Right  34(55)  Left  3(4) 10/28/2021:  Right 22(38), Left 0(15) 09/14/2021:  Right: 17(35), left 2(15)  Goal status: Progressing/Ongoing  7.  Pt. Will increase bilateral lateral pinch strength by 2 lbs to be able to securely grasp items during ADLs, and IADL tasks.  Baseline: 01/19/2023: Deferred as the Pinch meter is out for  calibration.12/06/2022: Pt. Is able to more securely hold objects during ADLs/IADLs 10/27/2022: Pt. Is improving with holding items with her bilateral thumbs. (Pinch meter  out for calibration) 09/20/2022: lateral pinch continues to be limited 09/13/22: R 6.5# L 4# 07/19/2022: TBD 07/05/2022: TBD 07/2022: NT-Pinch meter out for calibration. 02/03/2022: Right: 6#, Left: 4# 12/09/2021: Right: 6#, Left: 4#  Goal status: Deferred   9.  Pt. will complete plant care with modified independence.  Baseline:12/06/2022: Pt. Is independent with plant care in her husband's room, however is not able to access her plants due to furniture blocking access to them. 10/27/2022: Pt. Is able to water plants that are closer, and within reach. Pt. continues to have difficulty reaching for thorough plant care. 09/20/2022: Pt. Continues to have difficulty reaching for thorough plant care. 09/13/22: continues to report intermittent difficulty 07/19/2022: Pt. Continues to be able to water, and care for some of her plants. Pt. Has more difficulty with plants that are harder to reach. 07/05/2022: pt. Is able to water, and care for some of her plants. Pt. Has more difficulty with plants that are harder to reach. 04/07/2022: Pt. is now able to hold a cup and water her plants.02/03/2022: Pt. has difficulty caring for her plants.  Goal status:Achieved   10.  Pt. will demonstrate adaptive techniques to assist with the efficiency of self-dressing, or morning care tasks.  Baseline: 05/25/2023: Pt. continues to work on improving UE functioning to be able to efficiently use Adaptive equipment.  04/04/2023:  Pt. continues to work on improving UE functioning to be able to efficiently use Adaptive equipment. 01/19/2203: Pt. Is able to donn her jacket in reverse independently. Pt. requires assist from staff, as Pt. Reports staff have decreased time. 12/06/2022: Pt. requires assist from staff, as Pt. Reports staff have decreased time. 10/27/2022: Pt. is able to  assist with initiating UE dressing. Pt. requires assist from personal, and staff care aides. 09/20/2022: Pt. continues to require assist form personal, and staff care aides. 09/13/22: MODA dressing, reports she is often rushed 07/19/2022: Pt. continues to require assist with self-dressing/morning care tasks. 07/05/2022: Continue with goal. 04/07/2022: Pt. Continues to require assist with the efficiency of self-dressing, and morning care tasks.02/03/2022: Pt. requires assist from caregivers 2/2 time limitations during morning care.  Goal status: Ongoing  11.  Pt. will improve BUE strength to improve ADL, and IADL functioning.  Baseline: 05/25/2023: shoulder flexion L 4/5, R 4-/5; shoulder abduction L 4+/5, R 4-/5. elbow flexion B 5/5, elbow extension 4+/5 01/19/2023: L 4/5, R 3+/5  04/04/2023: shoulder flexion L 4/5, R 3+/5; shoulder abduction L 4+/5, R 3+/5. elbow flexion B 5/5, elbow extension 4/5 01/19/2023: L 4/5, R 3+/5 12/06/2022: Continue 10/27/2022: shoulder flexion L 4/5, R 3+/5; shoulder abduction L 4+/5, R 3+/5. elbow flexion B 5/5, elbow extension 4/5 09/20/2022: Pt. Presents with limited BUE strength 09/13/22: shoulder flexion L 4/5, R 3/5; elbow flexion B 5/5, elbow extension 4-/5; shoulder abduction L 4+/5, R 3+/5. 07/19/2022: BUE strength continues to be limited. 07/05/2022: shoulder flexion: right 4-/5, abduction: 3+/5, elbow flexion: right: 5/5, left 5/5, extension: right: 4-/5, left 4-/5, wrist extension: right: 3-/5, left: 2-/5  Goal status: Ongoing  12.  Pt. will improve bilateral thumb radial abduction in order to be able to hold the grab bars while standing with PT  Baseline: 05/25/23: Thumb radial abduction: R: 38(50), L:28(30) 04/04/23: R: 38(48), L:28(30) 01/19/2023: R: 38(48) L: 24(26)12/06/2022: Pt. Continues to present  with limited thumb abduction, however is improving holding onto the parallel bars.10/27/2022: thumb radial abduction: R: 30(42), L: 20(24  Goal status: Ongoing  13.  Pt. Will  be able to securely  hold, and stabilize medication bottles at a tabletop surface when opening, and closing them.  Baseline: 01/19/2023: Pt. Is now able to securely hold medication bottles while opening them, and stabilizes them on surfaces while opening them. 12/06/2022: Pt is improving with securely stabilizing medication bottles. 10/27/2022: Pt. Stabilizes bottles against her torso when attempting to open, and close them   Goal status: Achieved  14: Pt. Will improve bilateral thumb IP flexion to improve active grasping patterns.    Baseline: 05/25/2023: Thumb IP flexion: R: 40(50) L: 30(45) 04/04/2023:  R: 40(50) L: 25(45) 01/19/2023: Right 35(50), Left: 20/45)    Goal status: Ongoing    15. Pt. Will improve bilateral FMC/speed, and dexterity. As evidence by improved scores on the 9 hole peg test.  Baseline: 05/25/2023: 9 Hole pegs test speed/dexterity: Right: 9 pegs placed and removed in 2 min. & 41 sec. Left: Unable to grasp the pegs for a horizontal position. 04/04/23: Pt. was able to consistently grasp the pegs however each one slipped out of her fingers when attempting to place them into the Pegboard. 01/19/2023: 2 pegs placed in 1 min. & 5 sec.  Pt. was able to remove 9 pegs in 17 sec.; Left: 2 pegs placed in 1 min. & 10 sec. Pt. was able to remove 9 pegs in 20 sec.   Goal status: Ongoing  ASSESSMENT:  CLINICAL IMPRESSION:  Pt. presented with difficulty securely holding the reacher in the right hand today while managing the trigger, which reflects a change form the previous session when the Pt. was able to initiate securing the reacher with the right hand while grasping for items. Pt. was able to grasp the grooved pegs, and uses the left hand to assist with turning the grooved pegs into position prior to placing them in the pegboard.  Pt. tolerated ROM to her bilateral wrist, and digit MP, PIP, and DIPs. Pt. continues to benefit from OT services to work on impoving ROM, and UE strength in order to  work towards increasing bilateral hand grasp on objects, and increasing engagement of the bilateral hands during ADLs, and IADL tasks.     PERFORMANCE DE PERFORMANCE DEFICITS: in functional skills including ADLs, IADLs, coordination, dexterity, ROM, strength, and UE functional use, cognitive skills including , and psychosocial skills including coping strategies and environmental adaptation.   IMPAIRMENTS: are limiting patient from ADLs, IADLs, and leisure.   CO-MORBIDITIES: may have co-morbidities  that affects occupational performance. Patient will benefit from skilled OT to address above impairments and improve overall function.  MODIFICATION OR ASSISTANCE TO COMPLETE EVALUATION: Min-Moderate modification of tasks or assist with assess necessary to complete an evaluation.  OT OCCUPATIONAL PROFILE AND HISTORY: Detailed assessment: Review of records and additional review of physical, cognitive, psychosocial history related to current functional performance.  CLINICAL DECISION MAKING: Moderate - several treatment options, min-mod task modification necessary  REHAB POTENTIAL: Good for stated goals  PLAN:  OT FREQUENCY 2x's a week  OT DURATION: 12 weeks  PLANNED INTERVENTIONS ADL training, A/E training, UE ther. Ex, Manual therapy, neuromuscular re-education, moist heat modality, Paraffin Bath, Splinting, and  Pt./caregiver education   RECOMMENDED OTHER SERVICES: PT  CONSULTED AND AGREED WITH PLAN OF CARE: Patient  PLAN FOR NEXT SESSION: Continue Treatment as per established POC  Duey Ghent, MS, OTR/L   05/30/23, 2:52 PM

## 2023-06-01 ENCOUNTER — Ambulatory Visit: Payer: Medicare HMO

## 2023-06-01 ENCOUNTER — Ambulatory Visit: Payer: Medicare HMO | Admitting: Occupational Therapy

## 2023-06-03 LAB — COMPREHENSIVE METABOLIC PANEL WITH GFR
Calcium: 8.9 (ref 8.7–10.7)
eGFR: 91

## 2023-06-03 LAB — BASIC METABOLIC PANEL WITH GFR
BUN: 9 (ref 4–21)
CO2: 32 — AB (ref 13–22)
Chloride: 99 (ref 99–108)
Creatinine: 0.5 (ref 0.5–1.1)
Glucose: 104
Potassium: 4.3 meq/L (ref 3.5–5.1)
Sodium: 136 — AB (ref 137–147)

## 2023-06-06 ENCOUNTER — Ambulatory Visit: Payer: Medicare HMO | Attending: Internal Medicine | Admitting: Occupational Therapy

## 2023-06-06 ENCOUNTER — Ambulatory Visit: Payer: Medicare HMO

## 2023-06-06 DIAGNOSIS — R262 Difficulty in walking, not elsewhere classified: Secondary | ICD-10-CM

## 2023-06-06 DIAGNOSIS — R269 Unspecified abnormalities of gait and mobility: Secondary | ICD-10-CM | POA: Insufficient documentation

## 2023-06-06 DIAGNOSIS — S14129S Central cord syndrome at unspecified level of cervical spinal cord, sequela: Secondary | ICD-10-CM | POA: Insufficient documentation

## 2023-06-06 DIAGNOSIS — S72001A Fracture of unspecified part of neck of right femur, initial encounter for closed fracture: Secondary | ICD-10-CM

## 2023-06-06 DIAGNOSIS — S72002A Fracture of unspecified part of neck of left femur, initial encounter for closed fracture: Secondary | ICD-10-CM | POA: Insufficient documentation

## 2023-06-06 DIAGNOSIS — R2689 Other abnormalities of gait and mobility: Secondary | ICD-10-CM | POA: Insufficient documentation

## 2023-06-06 DIAGNOSIS — M6281 Muscle weakness (generalized): Secondary | ICD-10-CM | POA: Diagnosis present

## 2023-06-06 DIAGNOSIS — R278 Other lack of coordination: Secondary | ICD-10-CM

## 2023-06-06 DIAGNOSIS — R2681 Unsteadiness on feet: Secondary | ICD-10-CM | POA: Insufficient documentation

## 2023-06-06 NOTE — Therapy (Signed)
 OUTPATIENT PHYSICAL THERAPY NEURO TREATMENT   Patient Name: Sherry [name] MRN: [id number] DOB:1934-11-15, 89 y.o., female Today's Date: 06/06/2023   PCP: Sherry Gehrig, MD REFERRING PROVIDER: Marlynn [name]   PT End of Session - 06/06/23 1309     Visit Number 112    Number of Visits 124    Date for PT Re-Evaluation 06/20/23    Progress Note Due on Visit 120    PT Start Time 1146    PT Stop Time 1157    PT Time Calculation (min) 11 min    Equipment Utilized During Treatment Gait belt    Activity Tolerance Patient tolerated treatment well;No increased pain    Behavior During Therapy WFL for tasks assessed/performed                        Past Medical History:  Diagnosis Date   Acute blood loss anemia    Arthritis    Cancer (HCC)    skin   Central cord syndrome at C6 level of cervical spinal cord (HCC) 11/29/2017   Hypertension    Protein-calorie malnutrition, severe (HCC) 01/24/2018   S/P insertion of IVC (inferior vena caval) filter 01/24/2018   Tetraparesis (HCC)    Past Surgical History:  Procedure Laterality Date   ANTERIOR CERVICAL DECOMP/DISCECTOMY FUSION N/A 11/29/2017   Procedure: Cervical five-six, six-seven Anterior Cervical Decompression Fusion;  Surgeon: Sherry Champion, MD;  Location: [facility] OR;  Service: Neurosurgery;  Laterality: N/A;  Cervical five-six, six-seven Anterior Cervical Decompression Fusion   CATARACT EXTRACTION     FEMUR IM NAIL Left 08/16/2021   Procedure: INTRAMEDULLARY (IM) NAIL FEMORAL;  Surgeon: Sherry Singer, MD;  Location: [facility] ORS;  Service: Orthopedics;  Laterality: Left;   IR IVC FILTER PLMT / S&I /IMG GUID/MOD SED  12/08/2017   Patient Active Problem List   Diagnosis Date Noted   Age-related osteoporosis without current pathological fracture 07/27/2022   Mild recurrent major depression (HCC) 07/27/2022   Hypertensive heart disease with other congestive heart failure (HCC) 02/14/2022   Chronic indwelling Foley  catheter 02/14/2022   Closed hip fracture (HCC) 08/15/2021   DVT (deep venous thrombosis) (HCC) 08/15/2021   Quadriplegia (HCC) 08/15/2021   Fall 08/15/2021   Constipation due to slow transit 08/31/2018   Trauma 06/05/2018   Neuropathic pain 06/05/2018   Neurogenic bowel 06/05/2018   Vaginal yeast infection 01/30/2018   Healthcare-associated pneumonia 01/25/2018   Chronic allergic rhinitis 01/24/2018   Depression with anxiety 01/24/2018   UTI due to Klebsiella species 01/24/2018   Protein-calorie malnutrition, severe (HCC) 01/24/2018   S/P insertion of IVC (inferior vena caval) filter 01/24/2018   Tetraparesis (HCC) 01/20/2018   Neurogenic bladder 01/20/2018   Reactive depression    Benign essential HTN    Acute postoperative anemia due to expected blood loss    Central cord syndrome at C6 level of cervical spinal cord (HCC) 11/29/2017   Allergy to alpha-gal 11/25/2016   SCC (squamous cell carcinoma) 04/08/2014    ONSET DATE: 08/15/2021 (fall with B hip fx); Initial injury was in 2019- quadraplegia due to central cord syndrome of C6 secondary to neck fracture.   REFERRING DIAG: Bilateral Hip fx; Left Open- s/p Left IM nail femoral on 08/16/2021; Right closed  THERAPY DIAG:  Muscle weakness (generalized)  Other lack of coordination  Central cord syndrome, sequela (HCC)  Other abnormalities of gait and mobility  Difficulty in walking, not elsewhere classified  Closed fracture of both hips, initial encounter (  HCC)  Abnormality of gait and mobility  Rationale for Evaluation and Treatment Rehabilitation  SUBJECTIVE:   SUBJECTIVE STATEMENT:    Patient reports not feeling well at all today and stated really tired.         Pt accompanied by:  Paid caregiver-   PERTINENT HISTORY: Sherry [name] is an 87yoF who experienced a fall at home on 08/15/2021 with hip fracture, s/p Left hip ORIF. PMH: quadriplegia due to central cord syndrome of C6 secondary to neck fracture, HTN,  depression with anxiety, neurogenic bladder, bilateral DVT on Xarelto  (s/p of IVC filter placement). Prior history of significant PT/OT with improvement of function. Pt has full use of shoulder/elbows, limited use of hands, adaptive self feeding sucessful.     PAIN:  None current but does endorse some left thigh pain intermittent  OBJECTIVE:   TODAY'S TREATMENT:   06/06/23   Patient arrived after OT visit stating not feeling well  and very tired.  NO obvious issues but checked BP= 97/67 mmHg  PATIENT EDUCATION: Education details: Exercise technique Patient verbalized understanding.   HOME EXERCISE PROGRAM: No changes at this time  Access Code: [facility] URL: [email or web address] Date: 11/23/2021 Prepared by: Sherry  [name] Exercises - Seated Gluteal Sets - 2 x daily - 7 x weekly - 2 sets - 10 reps - 5 hold - Seated Quad Set - 2 x daily - 7 x weekly - 2 sets - 10 reps - 5 hold - Seated Long Arc Quad - 2 x daily - 7 x weekly - 2 sets - 10 reps - 5 hold - Seated March - 2 x daily - 7 x weekly - 2 sets - 10 reps - 5 hold - Seated Hip Abduction - 2 x daily - 7 x weekly - 2 sets - 10 reps - 5 hold - Seated Shoulder Shrugs - 2 x daily - 7 x weekly - 2 sets - 10 reps - 5 hold - Wheelchair Pressure Relief - 2 x daily - 7 x weekly - 2 sets - 10 reps - 5 hold  ASSESSMENT:   GOALS: Goals reviewed with patient? Yes  SHORT TERM GOALS: Target date: 02/22/2022  Pt will be independent with initial UE strengthening HEP in order to improve strength and balance in order to decrease fall risk and improve function at home and work. Baseline: 10/19/2021- patient with no formal UE HEP; 12/02/2021= Patient verbalized knowledge of HEP including use of theraband for UE strengthening.  Goal status: GOAL MET  LONG TERM GOALS: Target date: 06/20/2023  Pt will be independent with final for UE/LE HEP in order to improve strength and balance in order to decrease fall risk and improve function at home and  work. Baseline: Patient is currently BLE NWB and unable to participate in HEP. Has order for UE strengthening. 01/11/2022- Patient now able to participate in LE strengthening although NWB still- good understanding for some basic exercises. Will keep goal active to incorporate progressive LE strengthening exercises. 04/07/2022= Patient still participating in progressive LE seated HEP and has no questions at this time.  Goal status: MET  2.  Pt will improve FOTO to target score of 35  to display perceived improvements in ability to complete ADL's.  Baseline: 10/19/2021= 12; 12/02/2021= 17; 01/11/2022=17; 04/07/2022= 17; 07/05/2022=17; 10/06/2022- outcome measure not appropriate as patient in non-ambulatory and not consistently standing. Goal status: Goal not appropriate  3.  Pt will increase strength of B UE  by at least 1/2 MMT grade in order to  demonstrate improvement in strength and function  Baseline: patient has range of 2-/5 to 4/5 BUE Strength; 12/02/2021= 4/5 except with wrist ext. 01/11/2022= 4/5 B UE strength expept for wrist Goal status: Goal revised-no longer appropriate- working with OT on all UE strengthening.   4. Pt. Will increase strength of RLE by at least 1/2 MMT grade in order to demonstrate improvement in Standing/transfers.  Baseline: 2-/5 Right hip flex/knee ext/flex; 04/07/2022= 2- with right hip flex/knee ext (lacking 28 deg from zero) 4/8= Patient able to ext right knee lacking 18 deg from zero. 07/05/2022=Left knee approx 8 deg from zero and right knee = 23 deg from zero; 12/29/2022 - Left LE = full ROM 4-/5 and right knee ext= 23 deg from zero= 2+/5 (gravity minimized); 03/28/2023= Left knee ext=  4/5 and right knee ext= 3-/5 (able to move lower leg through incomplete ROM -lacking 20 deg from full ROM against gravity); 05/23/2023= right knee ext= 3-/5 (able to move lower leg through incomplete ROM -lacking 17 deg from full ROM against gravity)  Goal status: Progressing  5. Pt. Will demo  ability to stand pivot transfer with max assist for improved functional mobility and less dependent need on mechanical device.   Baseline: Dependent on hoyer lift for all transfers; 04/07/2022- Unable to test secondary to patient with recent UTI and right hand procedure - will attempt to reassess next visit. ; 04/12/22: Unable to successfully stand due to feet plates on loaned power w/c; 05/10/2022- Patient still having to use rental chair and has not received her original power w/c back to practice SPT. 07/04/2021= Patient just received new power w/c and attempted standing from 1st time today- Patient able to stand with max assist today from w/c and did not attempt to perform pivot transfer- difficulty with static standing and placing weight on right LE. 10/06/2022- Not assessed today- patient needs new AFO prior to attempting transfers; 12/29/2022= Patient has not attempted SPT yet - still trying to improve LE strength enough to stand well without knee buckling with weight shifting. 03/28/2023- not attempted today due to ill fitting brace but patient last performed SPT on 01/10/23 and was able to perform with Max A with feet pre-positioned to angle toward direction of transfer; 05/23/2023- Not tested today but on last visit- patient able to perform SPT with max assist yet concern for twisting knee as she was unable to turn/pivot foot .  Goal status: ONGOING  6. Pt. Will demonstrate improved functional LE strength as seen by ability to stand > 2 min for improved transfer ability and pregait abilities.   Baseline: not assessed today due to recent dx: UTI; 04/07/2022- Unable to test secondary to patient with recent UTI and right hand procedure - will attempt to reassess next visit; 04/12/22: Unable to successfully stand due to feet plates on loaned power w/c 05/10/2022- Patient still having to use rental chair and has not received her original power w/c back to practice Standing. 07/05/2022- Patient able to stand with max assist  at trunk today for 2 min 3 sec. Will keep goal active to ensure consistency. 10/06/2022= Unable to assess today- patient with some recent blood pressure issues and needs new AFO. 11/24/2022= Patient demonstrated multiple rounds of standing in // bars- holding up to 1:45 min today but has been able to exhibit 2 min in recent past- working on consistency. 12/29/2022= 2 min 40 sec with patient demonstrating improved overall posture and ability to contract glutes and quads with just min assist at  trunk today.   Goal status: MET  7. Pt. Will demonstrate improved functional LE strength as seen by ability to stand > 5 min for improved transfer ability and pregait abilities. Baseline: 12/29/2022= 2 min 40 sec with patient demonstrating improved overall posture and ability to contract glutes and quads with just min assist at trunk today. 03/28/2023= Attempted 2 trials today - at best 2 min 48 sec today but during cert- did stand for 3 min 44 sec.  Goal Status: Ongoing- Patient will likely receive new AFO in upcoming weeks and anticipate more standing time once received.  8. Patient will perform sit to stand transfer with moderate assist consistently > 75% of time for improved transfer ability and less dependence on caregiver.   Baseline: 12/29/2022- Patient currently max assist level assistance with all sit to stand activities. 03/28/2023= Patient is still majority of max assist to stand yet able to pull up from wheelchair smoother in one motion.   Goal status: ONGOING   ASSESSMENT:  CLINICAL IMPRESSION:   NON-Billable visit- Patient arrived not feeling well- stating just very tired. Checked BP low reading and patient then declined to perform any seated activities and requested to go home. Pt will continue to benefit from skilled PT services to optimize independence and reduced caregiver support.   OBJECTIVE IMPAIRMENTS Abnormal gait, decreased activity tolerance, decreased balance, decreased coordination,  decreased endurance, decreased mobility, difficulty walking, decreased ROM, decreased strength, hypomobility, impaired flexibility, impaired UE functional use, postural dysfunction, and pain.   ACTIVITY LIMITATIONS carrying, lifting, bending, sitting, standing, squatting, sleeping, stairs, transfers, bed mobility, continence, bathing, toileting, dressing, self feeding, reach over head, hygiene/grooming, and caring for others  PARTICIPATION LIMITATIONS: meal prep, cleaning, laundry, medication management, personal finances, interpersonal relationship, driving, shopping, community activity, and yard work  PERSONAL FACTORS Age, Time since onset of injury/illness/exacerbation, and 1-2 comorbidities: HTN, cervical Sx  are also affecting patient's functional outcome.   REHAB POTENTIAL: Good  CLINICAL DECISION MAKING: Evolving/moderate complexity  EVALUATION COMPLEXITY: Moderate  PLAN: PT FREQUENCY: 1-2x/week  PT DURATION: 12 weeks  PLANNED INTERVENTIONS: Therapeutic exercises, Therapeutic activity, Neuromuscular re-education, Balance training, Gait training, Patient/Family education, Self Care, Joint mobilization, DME instructions, Dry Needling, Electrical stimulation, Wheelchair mobility training, Spinal mobilization, Cryotherapy, Moist heat, and Manual therapy  PLAN FOR NEXT SESSION:  LE and core strength training, Sit to stand and static standing; progress to SPT as able.  Reassess next visit with new brace.   1:15 PM, 06/06/23  1:15 PM, 06/06/23 Ossie Blend, PT Physical Therapist - [place] [facility]  Outpatient Physical Therapy- Main Campus [phone]

## 2023-06-06 NOTE — Therapy (Addendum)
 Occupational Therapy  Neuro Treatment Note  Patient Name: Sherry [name] MRN: [id number] DOB:May 20, 1934, 89 y.o., female Today's Date: 11/29/2022  PCP:  Waunita Haff PROVIDER: Valrie Gehrig, MD  END OF SESSION:   OT End of Session - 06/06/23 1450     Visit Number [id number]    Number of Visits 192    Date for OT Re-Evaluation 08/17/23    Authorization Time Period Progress report period starting 04/04/23    OT Start Time 1100    OT Stop Time 1145    OT Time Calculation (min) 45 min    Activity Tolerance Patient tolerated treatment well    Behavior During Therapy [facility] for tasks assessed/performed                         Past Medical History:  Diagnosis Date   Acute blood loss anemia    Arthritis    Cancer (HCC)    skin   Central cord syndrome at C6 level of cervical spinal cord (HCC) 11/29/2017   Hypertension    Protein-calorie malnutrition, severe (HCC) 01/24/2018   S/P insertion of IVC (inferior vena caval) filter 01/24/2018   Tetraparesis (HCC)    Past Surgical History:  Procedure Laterality Date   ANTERIOR CERVICAL DECOMP/DISCECTOMY FUSION N/A 11/29/2017   Procedure: Cervical five-six, six-seven Anterior Cervical Decompression Fusion;  Surgeon: Cannon Champion, MD;  Location: [facility] OR;  Service: Neurosurgery;  Laterality: N/A;  Cervical five-six, six-seven Anterior Cervical Decompression Fusion   CATARACT EXTRACTION     FEMUR IM NAIL Left 08/16/2021   Procedure: INTRAMEDULLARY (IM) NAIL FEMORAL;  Surgeon: Marlynn Singer, MD;  Location: [facility] ORS;  Service: Orthopedics;  Laterality: Left;   IR IVC FILTER PLMT / S&I /IMG GUID/MOD SED  12/08/2017   Patient Active Problem List   Diagnosis Date Noted   Age-related osteoporosis without current pathological fracture 07/27/2022   Mild recurrent major depression (HCC) 07/27/2022   Hypertensive heart disease with other congestive heart failure (HCC) 02/14/2022   Chronic indwelling Foley catheter 02/14/2022   Closed  hip fracture (HCC) 08/15/2021   DVT (deep venous thrombosis) (HCC) 08/15/2021   Quadriplegia (HCC) 08/15/2021   Fall 08/15/2021   Constipation due to slow transit 08/31/2018   Trauma 06/05/2018   Neuropathic pain 06/05/2018   Neurogenic bowel 06/05/2018   Vaginal yeast infection 01/30/2018   Healthcare-associated pneumonia 01/25/2018   Chronic allergic rhinitis 01/24/2018   Depression with anxiety 01/24/2018   UTI due to Klebsiella species 01/24/2018   Protein-calorie malnutrition, severe (HCC) 01/24/2018   S/P insertion of IVC (inferior vena caval) filter 01/24/2018   Tetraparesis (HCC) 01/20/2018   Neurogenic bladder 01/20/2018   Reactive depression    Benign essential HTN    Acute postoperative anemia due to expected blood loss    Central cord syndrome at C6 level of cervical spinal cord (HCC) 11/29/2017   Allergy to alpha-gal 11/25/2016   SCC (squamous cell carcinoma) 04/08/2014   ONSET DATE: 11/29/2017  REFERRING DIAG: Central Cord Syndrome at C6 of the Cervical Spinal Cord, Fall with Bilateral Closed Hip Fractures, with ORIF repair with Intramedullary Nailing of the Left Hip Fracture.   THERAPY DIAG:  Muscle weakness (generalized)  Other lack of coordination  Rationale for Evaluation and Treatment: Rehabilitation  SUBJECTIVE:  SUBJECTIVE STATEMENT:  Pt. Reports that she is planning to celebrate Mother's Day next Monday.  Pt accompanied by: self, Personal Care aide  PERTINENT HISTORY: Central Cord Syndrome at C6 of the Cervical  Spinal Cord, Fall with Bilateral Closed Hip Fractures, with ORIF repair with Intramedullary Nailing of the Left Hip Fracture.   PRECAUTIONS: None  WEIGHT BEARING RESTRICTIONS: No  PAIN:  Are you having pain? No pain, just shoulder stiffness   LIVING ENVIRONMENT: Lives with: [facility] Stairs: No Has following equipment at home: Wheelchair (power)  PLOF: Independent   PATIENT GOALS:  See below for established  goals  OBJECTIVE:  Note: Objective measures were completed at Evaluation unless otherwise noted.  HAND DOMINANCE: Right  ADLs: Caregiver assist with ADLs, and [facility]  MOBILITY STATUS: Uses a power w/c  ACTIVITY TOLERANCE: Activity tolerance: Fair  FUNCTIONAL OUTCOME MEASURES:  Measurements:   12/09/2021:   Shoulder flexion: R: 120(136), L: 138(150) Shoulder abduction: R: 108(120), L: 110(120) Elbow: R: 0-148, L: 0-146 Wrist flexion: R: 55(62), L: 78 Writ extension: R: 34(55), L: 3(4) RD: R: 12(20), L: 16(26) UD: R: 8(24), L: 6(18) Thumb radial abduction: R: 11(20), L: 8(20) Digit flexion to the [facility]: R:  2nd: 5cm(3.5cm), 3rd: 7.5 cm(5cm), 4th: 7cm(3cm), 5th: 6cm(5.5cm) L: 2nd: 8cm(8cm), 3rd: 8cm(8cm), 4th: 7.5cm(7cm), 5th: 7cm(7cm)   02/03/2022:   Shoulder flexion: R: 122(138), L: 148(154) Shoulder abduction: R: 109(125), L: 125(125 Elbow: R: 0-148, L: 0-146 Wrist flexion: R: 60(68), L: 79 Writ extension: R: 40(55), L: 3(10) RD: R: 14(24), L: 20(28) UD: R: 8(24), L: 6(18) Thumb radial abduction: R: 20(25), L: 8(20) Digit flexion to the [facility]: R:  2nd: 4.5cm(3cm), 3rd: 6.5cm(4.5cm), 4th: 6.5 cm(3cm), 5th: 4.5cm(5cm) L: 2nd: 8cm(8cm), 3rd: 8cm(7cm), 4th: 7.5cm(7cm), 5th: 6.5cm(6.5cm)   04/07/2022:   Shoulder flexion: R: 125(150), L: 160(160) Shoulder abduction: R: 109(125), L: 125(125 Elbow: R: 0-148, L: 0-146 Wrist flexion: R: 60(68), L: 79 Writ extension: R: 40(55), L: 5(18) RD: R: 14(24), L: 20(28) UD: R: 18(24), L: 8(18) Thumb radial abduction: R: 20(25), L: 8(20) Digit flexion to the [facility]: R:  2nd: 4.5 cm(4 cm), 3rd: 6.5cm(3cm), 4th: 7 cm (5cm), 5th: 5cm(5cm) L: 2nd: 8cm(8cm), 3rd: 8cm(7cm), 4th: 7.5cm(7cm), 5th: 7cm(7cm)   07/05/2022:   Shoulder flexion: R: 125(154), L: 160(160) Shoulder abduction: R: 95(130), L: 143(143) Elbow: R: 0-148, L: 0-146 Wrist flexion: R: 60(68), L: 79 Writ extension: R: 40(60), L: 0(12) RD: R: 10(24), L: 12(20) UD:  R: 16(24), L: 8(16) Thumb radial abduction: R: 20(25), L: 8(20) Digit flexion to the [facility]: R:  2nd: 4 cm(4cm), 3rd: 6.5cm( 3.5cm), 4th: 7 cm (5cm), 5th: 6cm(5cm) L: 2nd: 8cm(8cm), 3rd: 8cm(7cm), 4th: 8cm(7cm), 5th: 7cm(7cm)   09/13/2022:   Shoulder flexion: R: 125(154), L: 160(160) Shoulder abduction: R: 100(130), L: 143(143) Elbow: R: 0-150, L: 0-150 Wrist flexion: R: 55(68), L: 79 Writ extension: R: 45(60), L: 0(12) RD: R: 10(24), L: 12(20) UD: R: 16(24), L: 8(16) Thumb radial abduction: R: 20(25), L: 8(20) Digit flexion to the [facility]: R:  2nd: 4 cm(4cm), 3rd: 6cm( 5cm), 4th: 6 cm (4cm), 5th: 5cm(5cm) L: 2nd: 7cm(cm), 3rd: 7cm(6cm), 4th: 7cm(6cm), 5th: 6cm(6cm)   10/27/2022   Shoulder flexion: R: 128(154), L: 160(160) Shoulder abduction: R: 105(130), L: 143(143) Elbow: R: 0-150, L: 0-150 Wrist flexion: R: 55(68), L: 79 Writ extension: R: 46(64), L: 0(20) RD: R: 10(20), L: 18(24) UD: R: 18(28), L: 8(20) Thumb radial abduction: R: 30(42), L:20(24) Digit flexion to the [facility]: R:  2nd: 4 cm(3cm), 3rd: 6cm( 6cm), 4th: 6 cm (5cm), 5th: 5cm(5cm) L: 2nd: 8cm(8cm), 3rd: 8cm(6cm), 4th: 8cm(7cm), 5th: 6cm(6cm)  01/19/2023  Shoulder flexion: R: 131(154), L: 160(160) Shoulder abduction: R:  110(130), L: 143(143) Elbow: R: 0-150, L: 0-150 Wrist flexion: R: 55(68), L: 79 Writ extension: R: 46(64), L: 0(20) RD: R: 10(20), L: 18(24) UD: R: 18(28), L: 14(20) Thumb radial abduction: R: 38(48), L:24(26) Thumb IP flexion: R: 35(50) L: 20(45) Digit flexion to the [facility]: R:  2nd: 3.5 cm(3cm), 3rd: 6cm( 6cm), 4th: 6 cm (5cm), 5th: 5cm(4.5cm) L: 2nd: 8cm(7.5cm), 3rd: 8cm(6cm), 4th: 7.5cm(6cm), 5th: 6cm(4.5cm)  04/04/2023  Shoulder flexion: R: 134(154), L: 160(160) Shoulder abduction: R: 114(130), L: 143(143) Elbow: R: WNL, L: WNL Wrist flexion: R: 55(68), L: 80 Writ extension: R: 46(64), L: -10(10) RD: R: 14(24), L: 20(24) UD: R: 20(30), L: 14(20) Thumb radial abduction: R: 38(48),  L:28(30) Thumb IP flexion: R: 40(50) L: 25(45) Digit flexion to the [facility]: R:  2nd: 3.5 cm(3cm), 3rd: 6cm( 5cm), 4th: 6 cm (4cm), 5th: 5cm(5cm) L: 2nd: 8cm(7.5cm), 3rd: 8cm(7cm), 4th: 7.5cm(7cm), 5th: 7cm(6cm)  05/25/2023  Shoulder flexion: R: 134(154), L: 160(160) Shoulder abduction: R: 117(134), L: 143(143) Elbow: R: WNL, L: WNL Wrist flexion: R: 55(68), L: 80 Wrist extension: R: 46(64), L: -8(14) RD: R: 14(24), L: 20(24) UD: R: 20(32), L: 14(22) Thumb radial abduction: R: 38(50), L:28(30) Thumb IP flexion: R: 40(50) L: 30(45) Digit flexion to the [facility]: R:  2nd: 3.5 cm(3cm), 3rd: 6cm( 5cm), 4th: 7 cm (4cm), 5th: 5cm(4cm) L: 2nd: 8cm(7.5cm), 3rd: 8cm(7cm), 4th: 7.5cm(7cm), 5th: 7cm(6cm)  COORDINATION:   01/19/2023:  9 hole peg test:  Right: 2 pegs placed in 1 min. & 5 sec.  Pt. was able to remove 9 pegs in 17 sec.  Left: 2 pegs placed in 1 min. & 10 sec. Pt. was able to remove 9 pegs in 20 sec.  04/04/23:  Pt. was able to consistently grasp the pegs however each one slipped out of her fingers when attempting to place them into the Pegboard.  05/25/2023:   Right: 9 pegs placed and removed in 2 min. & 41 sec. Left: Unable to grasp the pegs for a horizontal position.  SENSATION: Intact  EDEMA: N/A   COGNITION: Overall cognitive status: WNL   VISION: Subjective report: Wears glasses, No changes in vision.  PERCEPTION: Intact  TODAY'S TREATMENT:                                                                                                                              DATE:  06/06/23  Moist heat for Bilateral shoulder shoulders, and bilateral hands for muscle relaxation used simultaneous to passive stretching noted below  Therapeutic Ex.:  -Pt. tolerated PROM followed by AROM bilateral wrist extension, PROM for bilateral digit MP, PIP, and DIP flexion, and extension, thumb abduction.  -Lateral, and 3pt. Pinch strengthening using yellow, red, green level resistive clips  for the right hand. Pt. Attempted using the clips with the left hand for yellow, and red clips.   Self-care:   -Assessed reacher use with the right hand.       PATIENT EDUCATION:  Education details: Bilateral hand and shoulder ROM Person educated: Patient Education method: Explanation, Demonstration, and Verbal cues Education comprehension: verbalized understanding and returned demonstration  HOME EXERCISE PROGRAM: Continue to assess HEP needs, and provide as needed.   GOALS: Goals reviewed with patient? Yes  LONG TERM GOALS: Target date: 08/17/2023  3.  Patient will independently use a reacher to pick up items from various surfaces.  Baseline: 05/25/2023:  Pt. is now able to initiate grasping a reacher to open and close the reacher with the right hand, however requires the grasp handle to be repositioned  within the hand. Pt. requires work on using/moving the reacher with the right hand through various planes. 04/04/2023: Pt. continues to work on improving Digit flexion to the [facility] to be able to efficiently handle Adaptive equipment. 01/19/2023: Pt. Is improving with Bilateral digit flexion to the [facility]. Pt. has difficulty firmly holding adaptive devices. 12/06/2022: Pt. continues to present with increased MP, PIP, and DIP digit tightness/stiffness limiting the formulation of bilateral composites fists. 10/27/2022: Pt. presents with increased MP, PIP, and DIP digit tightness/stiffness limiting the formulation of bilateral composites fists during this progress reporting period limiting. Digit flexion to the [facility]: R:  2nd: 4 cm(3cm), 3rd: 6cm( 6cm), 4th: 6 cm (5cm), 5th: 5cm(5cm), L: 2nd: 8cm(8cm), 3rd: 8cm(6cm), 4th: 8cm(7cm), 5th: 6cm(6cm) 09/20/2022: Improving digit flexion to the [facility]. 09/13/22: improved digit flexion to the [facility]. 07/05/2022: Pt. presents with digit MP, PIP, and DIP extensor tightness limiting her ability to securely grip objects in her bilateral hands.  04/07/2022: Pt. Has improved  with right 2nd, and 3rd digit flexion towards the [facility]. Pt. Continues to have difficulty securely holding and applying deodorant.02/03/2022: Pt. Has improved with digit flexion, however, continues to have difficulty securely holding and using deodorant. 12/09/2021: Bilateral hand/digit MP, PIP, and DIP extension tightness limits her ability achieve digit flexion to hold, and apply deodorant. 10/28/2021:  Pt. continues to have difficulty holding the deodorant. 01/12/2021: Pt. Presents with limited digit extension. Pt. Is able to initiate holding deodorant, however is unable to hold it while using it  Goal status: Ongoing   4.  Pt. Will improve bilateral wrist extension in preparation for anticipating, and initiating reaching for objects at the table.  Baseline: 05/25/23: Wrist extension: R: 46(64), L: -8(14) 04/04/23: R: 46(64) L: -10(10)01/19/2023:  R: 46(64) L: 0(20) 12/06/2022: Pt continues to to progress with bilateral wrist extension in preparation fro functional reaching. 10/27/2022: Pt. Is improving with bilateral wrist extension. R: 46(64) L: 0(20) 09/20/2022: limited PROM in the Left wrist extension 2/2 tightness/stiffness. 09/13/22: R: 55(68), L: 0(12) 07/19/2022: Bilateral wrist extension in limited. 07/05/2022: Right: 40(60) Left: 0(12) 04/07/2022: Right: 40(55) Left: 5(18) 02/03/2022: Right: 34(55) Left: 3(4) 12/09/2021: Right  34(55)  Left  3(4) 10/28/2021:  Right 22(38), Left 0(15) 09/14/2021:  Right: 17(35), left 2(15)  Goal status: Progressing/Ongoing  7.  Pt. Will increase bilateral lateral pinch strength by 2 lbs to be able to securely grasp items during ADLs, and IADL tasks.  Baseline: 01/19/2023: Deferred as the Pinch meter is out for calibration.12/06/2022: Pt. Is able to more securely hold objects during ADLs/IADLs 10/27/2022: Pt. Is improving with holding items with her bilateral thumbs. (Pinch meter out for calibration) 09/20/2022: lateral pinch continues to be limited 09/13/22: R 6.5# L 4# 07/19/2022:  TBD 07/05/2022: TBD 07/2022: NT-Pinch meter out for calibration. 02/03/2022: Right: 6#, Left: 4# 12/09/2021: Right: 6#, Left: 4#  Goal status: Deferred   9.  Pt. will complete plant care with  modified independence.  Baseline:12/06/2022: Pt. Is independent with plant care in her husband's room, however is not able to access her plants due to furniture blocking access to them. 10/27/2022: Pt. Is able to water plants that are closer, and within reach. Pt. continues to have difficulty reaching for thorough plant care. 09/20/2022: Pt. Continues to have difficulty reaching for thorough plant care. 09/13/22: continues to report intermittent difficulty 07/19/2022: Pt. Continues to be able to water, and care for some of her plants. Pt. Has more difficulty with plants that are harder to reach. 07/05/2022: pt. Is able to water, and care for some of her plants. Pt. Has more difficulty with plants that are harder to reach. 04/07/2022: Pt. is now able to hold a cup and water her plants.02/03/2022: Pt. has difficulty caring for her plants.  Goal status:Achieved   10.  Pt. will demonstrate adaptive techniques to assist with the efficiency of self-dressing, or morning care tasks.  Baseline: 05/25/2023: Pt. continues to work on improving UE functioning to be able to efficiently use Adaptive equipment.  04/04/2023:  Pt. continues to work on improving UE functioning to be able to efficiently use Adaptive equipment. 01/19/2203: Pt. Is able to donn her jacket in reverse independently. Pt. requires assist from staff, as Pt. Reports staff have decreased time. 12/06/2022: Pt. requires assist from staff, as Pt. Reports staff have decreased time. 10/27/2022: Pt. is able to assist with initiating UE dressing. Pt. requires assist from personal, and staff care aides. 09/20/2022: Pt. continues to require assist form personal, and staff care aides. 09/13/22: MODA dressing, reports she is often rushed 07/19/2022: Pt. continues to require assist with  self-dressing/morning care tasks. 07/05/2022: Continue with goal. 04/07/2022: Pt. Continues to require assist with the efficiency of self-dressing, and morning care tasks.02/03/2022: Pt. requires assist from caregivers 2/2 time limitations during morning care.  Goal status: Ongoing  11.  Pt. will improve BUE strength to improve ADL, and IADL functioning.  Baseline: 05/25/2023: shoulder flexion L 4/5, R 4-/5; shoulder abduction L 4+/5, R 4-/5. elbow flexion B 5/5, elbow extension 4+/5 01/19/2023: L 4/5, R 3+/5  04/04/2023: shoulder flexion L 4/5, R 3+/5; shoulder abduction L 4+/5, R 3+/5. elbow flexion B 5/5, elbow extension 4/5 01/19/2023: L 4/5, R 3+/5 12/06/2022: Continue 10/27/2022: shoulder flexion L 4/5, R 3+/5; shoulder abduction L 4+/5, R 3+/5. elbow flexion B 5/5, elbow extension 4/5 09/20/2022: Pt. Presents with limited BUE strength 09/13/22: shoulder flexion L 4/5, R 3/5; elbow flexion B 5/5, elbow extension 4-/5; shoulder abduction L 4+/5, R 3+/5. 07/19/2022: BUE strength continues to be limited. 07/05/2022: shoulder flexion: right 4-/5, abduction: 3+/5, elbow flexion: right: 5/5, left 5/5, extension: right: 4-/5, left 4-/5, wrist extension: right: 3-/5, left: 2-/5  Goal status: Ongoing  12.  Pt. will improve bilateral thumb radial abduction in order to be able to hold the grab bars while standing with PT  Baseline: 05/25/23: Thumb radial abduction: R: 38(50), L:28(30) 04/04/23: R: 38(48), L:28(30) 01/19/2023: R: 38(48) L: 24(26)12/06/2022: Pt. Continues to present  with limited thumb abduction, however is improving holding onto the parallel bars.10/27/2022: thumb radial abduction: R: 30(42), L: 20(24  Goal status: Ongoing  13.  Pt. Will be able to securely hold, and stabilize medication bottles at a tabletop surface when opening, and closing them.  Baseline: 01/19/2023: Pt. Is now able to securely hold medication bottles while opening them, and stabilizes them on surfaces while opening them. 12/06/2022:  Pt is improving with securely stabilizing medication bottles. 10/27/2022: Pt. Stabilizes  bottles against her torso when attempting to open, and close them   Goal status: Achieved  14: Pt. Will improve bilateral thumb IP flexion to improve active grasping patterns.    Baseline: 05/25/2023: Thumb IP flexion: R: 40(50) L: 30(45) 04/04/2023:  R: 40(50) L: 25(45) 01/19/2023: Right 35(50), Left: 20/45)    Goal status: Ongoing    15. Pt. Will improve bilateral FMC/speed, and dexterity. As evidence by improved scores on the 9 hole peg test.  Baseline: 05/25/2023: 9 Hole pegs test speed/dexterity: Right: 9 pegs placed and removed in 2 min. & 41 sec. Left: Unable to grasp the pegs for a horizontal position. 04/04/23: Pt. was able to consistently grasp the pegs however each one slipped out of her fingers when attempting to place them into the Pegboard. 01/19/2023: 2 pegs placed in 1 min. & 5 sec.  Pt. was able to remove 9 pegs in 17 sec.; Left: 2 pegs placed in 1 min. & 10 sec. Pt. was able to remove 9 pegs in 20 sec.   Goal status: Ongoing  ASSESSMENT:  CLINICAL IMPRESSION:  Pt. reported feeling weak during the OT treatment session, and requested to have her BP taken: 117/64 with HR: 71 bpms. Pt. was able to grasp, and manipulate the reacher trigger to pick up a tissue. Pt. was able to manage the reacher while attempting to reach for one tissue. Pt. did lose the secure hold on the reach, and required assist to reposition in within her fingers. Pt. continues to tolerate ROM to her bilateral wrist, and digit MP, PIP, and DIPs. Pt. required cues for hand, and position of clips for pinch strengthening. Pt. continues to benefit from OT services to work on impoving ROM, and UE strength in order to work towards increasing bilateral hand grasp on objects, and increasing engagement of the bilateral hands during ADLs, and IADL tasks.     PERFORMANCE DE PERFORMANCE DEFICITS: in functional skills including ADLs, IADLs,  coordination, dexterity, ROM, strength, and UE functional use, cognitive skills including , and psychosocial skills including coping strategies and environmental adaptation.   IMPAIRMENTS: are limiting patient from ADLs, IADLs, and leisure.   CO-MORBIDITIES: may have co-morbidities  that affects occupational performance. Patient will benefit from skilled OT to address above impairments and improve overall function.  MODIFICATION OR ASSISTANCE TO COMPLETE EVALUATION: Min-Moderate modification of tasks or assist with assess necessary to complete an evaluation.  OT OCCUPATIONAL PROFILE AND HISTORY: Detailed assessment: Review of records and additional review of physical, cognitive, psychosocial history related to current functional performance.  CLINICAL DECISION MAKING: Moderate - several treatment options, min-mod task modification necessary  REHAB POTENTIAL: Good for stated goals  PLAN:  OT FREQUENCY 2x's a week  OT DURATION: 12 weeks  PLANNED INTERVENTIONS ADL training, A/E training, UE ther. Ex, Manual therapy, neuromuscular re-education, moist heat modality, Paraffin Bath, Splinting, and  Pt./caregiver education   RECOMMENDED OTHER SERVICES: PT  CONSULTED AND AGREED WITH PLAN OF CARE: Patient  PLAN FOR NEXT SESSION: Continue Treatment as per established POC  Duey Ghent, MS, OTR/L   06/06/23, 2:53 PM

## 2023-06-08 ENCOUNTER — Ambulatory Visit: Payer: Medicare HMO | Admitting: Occupational Therapy

## 2023-06-08 ENCOUNTER — Ambulatory Visit: Payer: Medicare HMO

## 2023-06-08 DIAGNOSIS — R2689 Other abnormalities of gait and mobility: Secondary | ICD-10-CM

## 2023-06-08 DIAGNOSIS — R2681 Unsteadiness on feet: Secondary | ICD-10-CM

## 2023-06-08 DIAGNOSIS — R262 Difficulty in walking, not elsewhere classified: Secondary | ICD-10-CM

## 2023-06-08 DIAGNOSIS — R269 Unspecified abnormalities of gait and mobility: Secondary | ICD-10-CM

## 2023-06-08 DIAGNOSIS — M6281 Muscle weakness (generalized): Secondary | ICD-10-CM

## 2023-06-08 DIAGNOSIS — S14129S Central cord syndrome at unspecified level of cervical spinal cord, sequela: Secondary | ICD-10-CM

## 2023-06-08 DIAGNOSIS — R278 Other lack of coordination: Secondary | ICD-10-CM

## 2023-06-08 DIAGNOSIS — S72002A Fracture of unspecified part of neck of left femur, initial encounter for closed fracture: Secondary | ICD-10-CM

## 2023-06-08 NOTE — Therapy (Signed)
 Occupational Therapy  Neuro Treatment Note  Patient Name: Sherry [name] MRN: [id number] DOB:1935/01/21, 89 y.o., female Today's Date: 11/29/2022  PCP:  Waunita Haff PROVIDER: Valrie Gehrig, MD  END OF SESSION:   OT End of Session - 06/08/23 1328     Visit Number [id number]    Number of Visits 192    Date for OT Re-Evaluation 08/17/23    Authorization Time Period Progress report period starting 04/04/23    OT Start Time 1100    OT Stop Time 1145    OT Time Calculation (min) 45 min    Activity Tolerance Patient tolerated treatment well    Behavior During Therapy [facility] for tasks assessed/performed                         Past Medical History:  Diagnosis Date   Acute blood loss anemia    Arthritis    Cancer (HCC)    skin   Central cord syndrome at C6 level of cervical spinal cord (HCC) 11/29/2017   Hypertension    Protein-calorie malnutrition, severe (HCC) 01/24/2018   S/P insertion of IVC (inferior vena caval) filter 01/24/2018   Tetraparesis (HCC)    Past Surgical History:  Procedure Laterality Date   ANTERIOR CERVICAL DECOMP/DISCECTOMY FUSION N/A 11/29/2017   Procedure: Cervical five-six, six-seven Anterior Cervical Decompression Fusion;  Surgeon: Cannon Champion, MD;  Location: [facility] OR;  Service: Neurosurgery;  Laterality: N/A;  Cervical five-six, six-seven Anterior Cervical Decompression Fusion   CATARACT EXTRACTION     FEMUR IM NAIL Left 08/16/2021   Procedure: INTRAMEDULLARY (IM) NAIL FEMORAL;  Surgeon: Marlynn Singer, MD;  Location: [facility] ORS;  Service: Orthopedics;  Laterality: Left;   IR IVC FILTER PLMT / S&I /IMG GUID/MOD SED  12/08/2017   Patient Active Problem List   Diagnosis Date Noted   Age-related osteoporosis without current pathological fracture 07/27/2022   Mild recurrent major depression (HCC) 07/27/2022   Hypertensive heart disease with other congestive heart failure (HCC) 02/14/2022   Chronic indwelling Foley catheter 02/14/2022   Closed  hip fracture (HCC) 08/15/2021   DVT (deep venous thrombosis) (HCC) 08/15/2021   Quadriplegia (HCC) 08/15/2021   Fall 08/15/2021   Constipation due to slow transit 08/31/2018   Trauma 06/05/2018   Neuropathic pain 06/05/2018   Neurogenic bowel 06/05/2018   Vaginal yeast infection 01/30/2018   Healthcare-associated pneumonia 01/25/2018   Chronic allergic rhinitis 01/24/2018   Depression with anxiety 01/24/2018   UTI due to Klebsiella species 01/24/2018   Protein-calorie malnutrition, severe (HCC) 01/24/2018   S/P insertion of IVC (inferior vena caval) filter 01/24/2018   Tetraparesis (HCC) 01/20/2018   Neurogenic bladder 01/20/2018   Reactive depression    Benign essential HTN    Acute postoperative anemia due to expected blood loss    Central cord syndrome at C6 level of cervical spinal cord (HCC) 11/29/2017   Allergy to alpha-gal 11/25/2016   SCC (squamous cell carcinoma) 04/08/2014   ONSET DATE: 11/29/2017  REFERRING DIAG: Central Cord Syndrome at C6 of the Cervical Spinal Cord, Fall with Bilateral Closed Hip Fractures, with ORIF repair with Intramedullary Nailing of the Left Hip Fracture.   THERAPY DIAG:  Muscle weakness (generalized)  Other lack of coordination  Rationale for Evaluation and Treatment: Rehabilitation  SUBJECTIVE:  SUBJECTIVE STATEMENT:  Pt. reports that she is planning to celebrate Mother's Day next Monday.  Pt accompanied by: self, Personal Care aide  PERTINENT HISTORY: Central Cord Syndrome at C6 of the Cervical  Spinal Cord, Fall with Bilateral Closed Hip Fractures, with ORIF repair with Intramedullary Nailing of the Left Hip Fracture.   PRECAUTIONS: None  WEIGHT BEARING RESTRICTIONS: No  PAIN:  Are you having pain? No pain, just shoulder stiffness   LIVING ENVIRONMENT: Lives with: [facility] Stairs: No Has following equipment at home: Wheelchair (power)  PLOF: Independent   PATIENT GOALS:  See below for established  goals  OBJECTIVE:  Note: Objective measures were completed at Evaluation unless otherwise noted.  HAND DOMINANCE: Right  ADLs: Caregiver assist with ADLs, and [facility]  MOBILITY STATUS: Uses a power w/c  ACTIVITY TOLERANCE: Activity tolerance: Fair  FUNCTIONAL OUTCOME MEASURES:  Measurements:   12/09/2021:   Shoulder flexion: R: 120(136), L: 138(150) Shoulder abduction: R: 108(120), L: 110(120) Elbow: R: 0-148, L: 0-146 Wrist flexion: R: 55(62), L: 78 Writ extension: R: 34(55), L: 3(4) RD: R: 12(20), L: 16(26) UD: R: 8(24), L: 6(18) Thumb radial abduction: R: 11(20), L: 8(20) Digit flexion to the [facility]: R:  2nd: 5cm(3.5cm), 3rd: 7.5 cm(5cm), 4th: 7cm(3cm), 5th: 6cm(5.5cm) L: 2nd: 8cm(8cm), 3rd: 8cm(8cm), 4th: 7.5cm(7cm), 5th: 7cm(7cm)   02/03/2022:   Shoulder flexion: R: 122(138), L: 148(154) Shoulder abduction: R: 109(125), L: 125(125 Elbow: R: 0-148, L: 0-146 Wrist flexion: R: 60(68), L: 79 Writ extension: R: 40(55), L: 3(10) RD: R: 14(24), L: 20(28) UD: R: 8(24), L: 6(18) Thumb radial abduction: R: 20(25), L: 8(20) Digit flexion to the [facility]: R:  2nd: 4.5cm(3cm), 3rd: 6.5cm(4.5cm), 4th: 6.5 cm(3cm), 5th: 4.5cm(5cm) L: 2nd: 8cm(8cm), 3rd: 8cm(7cm), 4th: 7.5cm(7cm), 5th: 6.5cm(6.5cm)   04/07/2022:   Shoulder flexion: R: 125(150), L: 160(160) Shoulder abduction: R: 109(125), L: 125(125 Elbow: R: 0-148, L: 0-146 Wrist flexion: R: 60(68), L: 79 Writ extension: R: 40(55), L: 5(18) RD: R: 14(24), L: 20(28) UD: R: 18(24), L: 8(18) Thumb radial abduction: R: 20(25), L: 8(20) Digit flexion to the [facility]: R:  2nd: 4.5 cm(4 cm), 3rd: 6.5cm(3cm), 4th: 7 cm (5cm), 5th: 5cm(5cm) L: 2nd: 8cm(8cm), 3rd: 8cm(7cm), 4th: 7.5cm(7cm), 5th: 7cm(7cm)   07/05/2022:   Shoulder flexion: R: 125(154), L: 160(160) Shoulder abduction: R: 95(130), L: 143(143) Elbow: R: 0-148, L: 0-146 Wrist flexion: R: 60(68), L: 79 Writ extension: R: 40(60), L: 0(12) RD: R: 10(24), L: 12(20) UD:  R: 16(24), L: 8(16) Thumb radial abduction: R: 20(25), L: 8(20) Digit flexion to the [facility]: R:  2nd: 4 cm(4cm), 3rd: 6.5cm( 3.5cm), 4th: 7 cm (5cm), 5th: 6cm(5cm) L: 2nd: 8cm(8cm), 3rd: 8cm(7cm), 4th: 8cm(7cm), 5th: 7cm(7cm)   09/13/2022:   Shoulder flexion: R: 125(154), L: 160(160) Shoulder abduction: R: 100(130), L: 143(143) Elbow: R: 0-150, L: 0-150 Wrist flexion: R: 55(68), L: 79 Writ extension: R: 45(60), L: 0(12) RD: R: 10(24), L: 12(20) UD: R: 16(24), L: 8(16) Thumb radial abduction: R: 20(25), L: 8(20) Digit flexion to the [facility]: R:  2nd: 4 cm(4cm), 3rd: 6cm( 5cm), 4th: 6 cm (4cm), 5th: 5cm(5cm) L: 2nd: 7cm(cm), 3rd: 7cm(6cm), 4th: 7cm(6cm), 5th: 6cm(6cm)   10/27/2022   Shoulder flexion: R: 128(154), L: 160(160) Shoulder abduction: R: 105(130), L: 143(143) Elbow: R: 0-150, L: 0-150 Wrist flexion: R: 55(68), L: 79 Writ extension: R: 46(64), L: 0(20) RD: R: 10(20), L: 18(24) UD: R: 18(28), L: 8(20) Thumb radial abduction: R: 30(42), L:20(24) Digit flexion to the [facility]: R:  2nd: 4 cm(3cm), 3rd: 6cm( 6cm), 4th: 6 cm (5cm), 5th: 5cm(5cm) L: 2nd: 8cm(8cm), 3rd: 8cm(6cm), 4th: 8cm(7cm), 5th: 6cm(6cm)  01/19/2023  Shoulder flexion: R: 131(154), L: 160(160) Shoulder abduction: R:  110(130), L: 143(143) Elbow: R: 0-150, L: 0-150 Wrist flexion: R: 55(68), L: 79 Writ extension: R: 46(64), L: 0(20) RD: R: 10(20), L: 18(24) UD: R: 18(28), L: 14(20) Thumb radial abduction: R: 38(48), L:24(26) Thumb IP flexion: R: 35(50) L: 20(45) Digit flexion to the [facility]: R:  2nd: 3.5 cm(3cm), 3rd: 6cm( 6cm), 4th: 6 cm (5cm), 5th: 5cm(4.5cm) L: 2nd: 8cm(7.5cm), 3rd: 8cm(6cm), 4th: 7.5cm(6cm), 5th: 6cm(4.5cm)  04/04/2023  Shoulder flexion: R: 134(154), L: 160(160) Shoulder abduction: R: 114(130), L: 143(143) Elbow: R: WNL, L: WNL Wrist flexion: R: 55(68), L: 80 Writ extension: R: 46(64), L: -10(10) RD: R: 14(24), L: 20(24) UD: R: 20(30), L: 14(20) Thumb radial abduction: R: 38(48),  L:28(30) Thumb IP flexion: R: 40(50) L: 25(45) Digit flexion to the [facility]: R:  2nd: 3.5 cm(3cm), 3rd: 6cm( 5cm), 4th: 6 cm (4cm), 5th: 5cm(5cm) L: 2nd: 8cm(7.5cm), 3rd: 8cm(7cm), 4th: 7.5cm(7cm), 5th: 7cm(6cm)  05/25/2023  Shoulder flexion: R: 134(154), L: 160(160) Shoulder abduction: R: 117(134), L: 143(143) Elbow: R: WNL, L: WNL Wrist flexion: R: 55(68), L: 80 Wrist extension: R: 46(64), L: -8(14) RD: R: 14(24), L: 20(24) UD: R: 20(32), L: 14(22) Thumb radial abduction: R: 38(50), L:28(30) Thumb IP flexion: R: 40(50) L: 30(45) Digit flexion to the [facility]: R:  2nd: 3.5 cm(3cm), 3rd: 6cm( 5cm), 4th: 7 cm (4cm), 5th: 5cm(4cm) L: 2nd: 8cm(7.5cm), 3rd: 8cm(7cm), 4th: 7.5cm(7cm), 5th: 7cm(6cm)  COORDINATION:   01/19/2023:  9 hole peg test:  Right: 2 pegs placed in 1 min. & 5 sec.  Pt. was able to remove 9 pegs in 17 sec.  Left: 2 pegs placed in 1 min. & 10 sec. Pt. was able to remove 9 pegs in 20 sec.  04/04/23:  Pt. was able to consistently grasp the pegs however each one slipped out of her fingers when attempting to place them into the Pegboard.  05/25/2023:   Right: 9 pegs placed and removed in 2 min. & 41 sec. Left: Unable to grasp the pegs for a horizontal position.  SENSATION: Intact  EDEMA: N/A   COGNITION: Overall cognitive status: WNL   VISION: Subjective report: Wears glasses, No changes in vision.  PERCEPTION: Intact  TODAY'S TREATMENT:                                                                                                                              DATE:  06/08/23  Moist heat for Bilateral shoulder shoulders, and bilateral hands for muscle relaxation used simultaneous to passive stretching noted below  Therapeutic Ex.:  -Pt. tolerated PROM followed by AROM bilateral wrist extension, PROM for bilateral digit MP, PIP, and DIP flexion, and extension, thumb abduction.  -Pt. worked on [employer], and reciprocal motion using the UBE while seated  for 8 min. with no resistance.   Neuromuscular re-education:   - facilitated right hand [facility] skills grasping 1.5" flat discs, and sliding them off of a flat elevated surface. Pt. worked on pushing them through  a thin slot, placed at various elevated angles.  -facilitated right hand [facility] skills grasping 1/2" [place] beads from a shallow dish, and moving to the opposite end of the board.    PATIENT EDUCATION: Education details: Bilateral hand and shoulder ROM Person educated: Patient Education method: Explanation, Demonstration, and Verbal cues Education comprehension: verbalized understanding and returned demonstration  HOME EXERCISE PROGRAM: Continue to assess HEP needs, and provide as needed.   GOALS: Goals reviewed with patient? Yes  LONG TERM GOALS: Target date: 08/17/2023  3.  Patient will independently use a reacher to pick up items from various surfaces.  Baseline: 05/25/2023:  Pt. is now able to initiate grasping a reacher to open and close the reacher with the right hand, however requires the grasp handle to be repositioned  within the hand. Pt. requires work on using/moving the reacher with the right hand through various planes. 04/04/2023: Pt. continues to work on improving Digit flexion to the [facility] to be able to efficiently handle Adaptive equipment. 01/19/2023: Pt. Is improving with Bilateral digit flexion to the [facility]. Pt. has difficulty firmly holding adaptive devices. 12/06/2022: Pt. continues to present with increased MP, PIP, and DIP digit tightness/stiffness limiting the formulation of bilateral composites fists. 10/27/2022: Pt. presents with increased MP, PIP, and DIP digit tightness/stiffness limiting the formulation of bilateral composites fists during this progress reporting period limiting. Digit flexion to the [facility]: R:  2nd: 4 cm(3cm), 3rd: 6cm( 6cm), 4th: 6 cm (5cm), 5th: 5cm(5cm), L: 2nd: 8cm(8cm), 3rd: 8cm(6cm), 4th: 8cm(7cm), 5th: 6cm(6cm) 09/20/2022: Improving digit flexion  to the [facility]. 09/13/22: improved digit flexion to the [facility]. 07/05/2022: Pt. presents with digit MP, PIP, and DIP extensor tightness limiting her ability to securely grip objects in her bilateral hands.  04/07/2022: Pt. Has improved with right 2nd, and 3rd digit flexion towards the [facility]. Pt. Continues to have difficulty securely holding and applying deodorant.02/03/2022: Pt. Has improved with digit flexion, however, continues to have difficulty securely holding and using deodorant. 12/09/2021: Bilateral hand/digit MP, PIP, and DIP extension tightness limits her ability achieve digit flexion to hold, and apply deodorant. 10/28/2021:  Pt. continues to have difficulty holding the deodorant. 01/12/2021: Pt. Presents with limited digit extension. Pt. Is able to initiate holding deodorant, however is unable to hold it while using it  Goal status: Ongoing   4.  Pt. Will improve bilateral wrist extension in preparation for anticipating, and initiating reaching for objects at the table.  Baseline: 05/25/23: Wrist extension: R: 46(64), L: -8(14) 04/04/23: R: 46(64) L: -10(10)01/19/2023:  R: 46(64) L: 0(20) 12/06/2022: Pt continues to to progress with bilateral wrist extension in preparation fro functional reaching. 10/27/2022: Pt. Is improving with bilateral wrist extension. R: 46(64) L: 0(20) 09/20/2022: limited PROM in the Left wrist extension 2/2 tightness/stiffness. 09/13/22: R: 55(68), L: 0(12) 07/19/2022: Bilateral wrist extension in limited. 07/05/2022: Right: 40(60) Left: 0(12) 04/07/2022: Right: 40(55) Left: 5(18) 02/03/2022: Right: 34(55) Left: 3(4) 12/09/2021: Right  34(55)  Left  3(4) 10/28/2021:  Right 22(38), Left 0(15) 09/14/2021:  Right: 17(35), left 2(15)  Goal status: Progressing/Ongoing  7.  Pt. Will increase bilateral lateral pinch strength by 2 lbs to be able to securely grasp items during ADLs, and IADL tasks.  Baseline: 01/19/2023: Deferred as the Pinch meter is out for calibration.12/06/2022: Pt. Is able to more  securely hold objects during ADLs/IADLs 10/27/2022: Pt. Is improving with holding items with her bilateral thumbs. (Pinch meter out for calibration) 09/20/2022: lateral pinch continues to be limited 09/13/22: R 6.5#  L 4# 07/19/2022: TBD 07/05/2022: TBD 07/2022: NT-Pinch meter out for calibration. 02/03/2022: Right: 6#, Left: 4# 12/09/2021: Right: 6#, Left: 4#  Goal status: Deferred   9.  Pt. will complete plant care with modified independence.  Baseline:12/06/2022: Pt. Is independent with plant care in her husband's room, however is not able to access her plants due to furniture blocking access to them. 10/27/2022: Pt. Is able to water plants that are closer, and within reach. Pt. continues to have difficulty reaching for thorough plant care. 09/20/2022: Pt. Continues to have difficulty reaching for thorough plant care. 09/13/22: continues to report intermittent difficulty 07/19/2022: Pt. Continues to be able to water, and care for some of her plants. Pt. Has more difficulty with plants that are harder to reach. 07/05/2022: pt. Is able to water, and care for some of her plants. Pt. Has more difficulty with plants that are harder to reach. 04/07/2022: Pt. is now able to hold a cup and water her plants.02/03/2022: Pt. has difficulty caring for her plants.  Goal status:Achieved   10.  Pt. will demonstrate adaptive techniques to assist with the efficiency of self-dressing, or morning care tasks.  Baseline: 05/25/2023: Pt. continues to work on improving UE functioning to be able to efficiently use Adaptive equipment.  04/04/2023:  Pt. continues to work on improving UE functioning to be able to efficiently use Adaptive equipment. 01/19/2203: Pt. Is able to donn her jacket in reverse independently. Pt. requires assist from staff, as Pt. Reports staff have decreased time. 12/06/2022: Pt. requires assist from staff, as Pt. Reports staff have decreased time. 10/27/2022: Pt. is able to assist with initiating UE dressing. Pt. requires  assist from personal, and staff care aides. 09/20/2022: Pt. continues to require assist form personal, and staff care aides. 09/13/22: MODA dressing, reports she is often rushed 07/19/2022: Pt. continues to require assist with self-dressing/morning care tasks. 07/05/2022: Continue with goal. 04/07/2022: Pt. Continues to require assist with the efficiency of self-dressing, and morning care tasks.02/03/2022: Pt. requires assist from caregivers 2/2 time limitations during morning care.  Goal status: Ongoing  11.  Pt. will improve BUE strength to improve ADL, and IADL functioning.  Baseline: 05/25/2023: shoulder flexion L 4/5, R 4-/5; shoulder abduction L 4+/5, R 4-/5. elbow flexion B 5/5, elbow extension 4+/5 01/19/2023: L 4/5, R 3+/5  04/04/2023: shoulder flexion L 4/5, R 3+/5; shoulder abduction L 4+/5, R 3+/5. elbow flexion B 5/5, elbow extension 4/5 01/19/2023: L 4/5, R 3+/5 12/06/2022: Continue 10/27/2022: shoulder flexion L 4/5, R 3+/5; shoulder abduction L 4+/5, R 3+/5. elbow flexion B 5/5, elbow extension 4/5 09/20/2022: Pt. Presents with limited BUE strength 09/13/22: shoulder flexion L 4/5, R 3/5; elbow flexion B 5/5, elbow extension 4-/5; shoulder abduction L 4+/5, R 3+/5. 07/19/2022: BUE strength continues to be limited. 07/05/2022: shoulder flexion: right 4-/5, abduction: 3+/5, elbow flexion: right: 5/5, left 5/5, extension: right: 4-/5, left 4-/5, wrist extension: right: 3-/5, left: 2-/5  Goal status: Ongoing  12.  Pt. will improve bilateral thumb radial abduction in order to be able to hold the grab bars while standing with PT  Baseline: 05/25/23: Thumb radial abduction: R: 38(50), L:28(30) 04/04/23: R: 38(48), L:28(30) 01/19/2023: R: 38(48) L: 24(26)12/06/2022: Pt. Continues to present  with limited thumb abduction, however is improving holding onto the parallel bars.10/27/2022: thumb radial abduction: R: 30(42), L: 20(24  Goal status: Ongoing  13.  Pt. Will be able to securely hold, and stabilize medication  bottles at a tabletop surface when opening, and closing  them.  Baseline: 01/19/2023: Pt. Is now able to securely hold medication bottles while opening them, and stabilizes them on surfaces while opening them. 12/06/2022: Pt is improving with securely stabilizing medication bottles. 10/27/2022: Pt. Stabilizes bottles against her torso when attempting to open, and close them   Goal status: Achieved  14: Pt. Will improve bilateral thumb IP flexion to improve active grasping patterns.    Baseline: 05/25/2023: Thumb IP flexion: R: 40(50) L: 30(45) 04/04/2023:  R: 40(50) L: 25(45) 01/19/2023: Right 35(50), Left: 20/45)    Goal status: Ongoing    15. Pt. Will improve bilateral FMC/speed, and dexterity. As evidence by improved scores on the 9 hole peg test.  Baseline: 05/25/2023: 9 Hole pegs test speed/dexterity: Right: 9 pegs placed and removed in 2 min. & 41 sec. Left: Unable to grasp the pegs for a horizontal position. 04/04/23: Pt. was able to consistently grasp the pegs however each one slipped out of her fingers when attempting to place them into the Pegboard. 01/19/2023: 2 pegs placed in 1 min. & 5 sec.  Pt. was able to remove 9 pegs in 17 sec.; Left: 2 pegs placed in 1 min. & 10 sec. Pt. was able to remove 9 pegs in 20 sec.   Goal status: Ongoing  ASSESSMENT:  CLINICAL IMPRESSION:  Pt. reports feeling good today. No reports of feeling weak this morning. Pt. was able to reach up, grasp, and place the flat discs into the slots consistently.  Pt. presents with RUE fatigue when moving the [place] beads. Pt. continues to benefit from OT services to work on impoving ROM, and UE strength in order to work towards increasing bilateral hand grasp on objects, and increasing engagement of the bilateral hands during ADLs, and IADL tasks.     PERFORMANCE DE PERFORMANCE DEFICITS: in functional skills including ADLs, IADLs, coordination, dexterity, ROM, strength, and UE functional use, cognitive skills including , and  psychosocial skills including coping strategies and environmental adaptation.   IMPAIRMENTS: are limiting patient from ADLs, IADLs, and leisure.   CO-MORBIDITIES: may have co-morbidities  that affects occupational performance. Patient will benefit from skilled OT to address above impairments and improve overall function.  MODIFICATION OR ASSISTANCE TO COMPLETE EVALUATION: Min-Moderate modification of tasks or assist with assess necessary to complete an evaluation.  OT OCCUPATIONAL PROFILE AND HISTORY: Detailed assessment: Review of records and additional review of physical, cognitive, psychosocial history related to current functional performance.  CLINICAL DECISION MAKING: Moderate - several treatment options, min-mod task modification necessary  REHAB POTENTIAL: Good for stated goals  PLAN:  OT FREQUENCY 2x's a week  OT DURATION: 12 weeks  PLANNED INTERVENTIONS ADL training, A/E training, UE ther. Ex, Manual therapy, neuromuscular re-education, moist heat modality, Paraffin Bath, Splinting, and  Pt./caregiver education   RECOMMENDED OTHER SERVICES: PT  CONSULTED AND AGREED WITH PLAN OF CARE: Patient  PLAN FOR NEXT SESSION: Continue Treatment as per established POC  Duey Ghent, MS, OTR/L   06/08/23, 1:36 PM

## 2023-06-08 NOTE — Therapy (Signed)
 OUTPATIENT PHYSICAL THERAPY NEURO TREATMENT   Patient Name: Sherry [name] MRN: [id number] DOB:1934-05-28, 89 y.o., female Today's Date: 06/09/2023   PCP: Valrie Gehrig, MD REFERRING PROVIDER: Marlynn Singer   PT End of Session - 06/08/23 1153     Visit Number 113    Number of Visits 124    Date for PT Re-Evaluation 06/20/23    Progress Note Due on Visit 120    PT Start Time 1145    PT Stop Time 1228    PT Time Calculation (min) 43 min    Equipment Utilized During Treatment Gait belt    Activity Tolerance Patient tolerated treatment well;No increased pain    Behavior During Therapy WFL for tasks assessed/performed                        Past Medical History:  Diagnosis Date   Acute blood loss anemia    Arthritis    Cancer (HCC)    skin   Central cord syndrome at C6 level of cervical spinal cord (HCC) 11/29/2017   Hypertension    Protein-calorie malnutrition, severe (HCC) 01/24/2018   S/P insertion of IVC (inferior vena caval) filter 01/24/2018   Tetraparesis (HCC)    Past Surgical History:  Procedure Laterality Date   ANTERIOR CERVICAL DECOMP/DISCECTOMY FUSION N/A 11/29/2017   Procedure: Cervical five-six, six-seven Anterior Cervical Decompression Fusion;  Surgeon: Cannon Champion, MD;  Location: [facility] OR;  Service: Neurosurgery;  Laterality: N/A;  Cervical five-six, six-seven Anterior Cervical Decompression Fusion   CATARACT EXTRACTION     FEMUR IM NAIL Left 08/16/2021   Procedure: INTRAMEDULLARY (IM) NAIL FEMORAL;  Surgeon: Marlynn Singer, MD;  Location: [facility] ORS;  Service: Orthopedics;  Laterality: Left;   IR IVC FILTER PLMT / S&I /IMG GUID/MOD SED  12/08/2017   Patient Active Problem List   Diagnosis Date Noted   Age-related osteoporosis without current pathological fracture 07/27/2022   Mild recurrent major depression (HCC) 07/27/2022   Hypertensive heart disease with other congestive heart failure (HCC) 02/14/2022   Chronic indwelling Foley  catheter 02/14/2022   Closed hip fracture (HCC) 08/15/2021   DVT (deep venous thrombosis) (HCC) 08/15/2021   Quadriplegia (HCC) 08/15/2021   Fall 08/15/2021   Constipation due to slow transit 08/31/2018   Trauma 06/05/2018   Neuropathic pain 06/05/2018   Neurogenic bowel 06/05/2018   Vaginal yeast infection 01/30/2018   Healthcare-associated pneumonia 01/25/2018   Chronic allergic rhinitis 01/24/2018   Depression with anxiety 01/24/2018   UTI due to Klebsiella species 01/24/2018   Protein-calorie malnutrition, severe (HCC) 01/24/2018   S/P insertion of IVC (inferior vena caval) filter 01/24/2018   Tetraparesis (HCC) 01/20/2018   Neurogenic bladder 01/20/2018   Reactive depression    Benign essential HTN    Acute postoperative anemia due to expected blood loss    Central cord syndrome at C6 level of cervical spinal cord (HCC) 11/29/2017   Allergy to alpha-gal 11/25/2016   SCC (squamous cell carcinoma) 04/08/2014    ONSET DATE: 08/15/2021 (fall with B hip fx); Initial injury was in 2019- quadraplegia due to central cord syndrome of C6 secondary to neck fracture.   REFERRING DIAG: Bilateral Hip fx; Left Open- s/p Left IM nail femoral on 08/16/2021; Right closed  THERAPY DIAG:  Muscle weakness (generalized)  Other lack of coordination  Central cord syndrome, sequela (HCC)  Other abnormalities of gait and mobility  Difficulty in walking, not elsewhere classified  Closed fracture of both hips, initial encounter (  HCC)  Abnormality of gait and mobility  Unsteadiness on feet  Rationale for Evaluation and Treatment Rehabilitation  SUBJECTIVE:   SUBJECTIVE STATEMENT:    Patient reports feeling better overall  and denies pain today.         Pt accompanied by: Paid caregiver-   PERTINENT HISTORY: Sherry [name] is an 87yoF who experienced a fall at home on 08/15/2021 with hip fracture, s/p Left hip ORIF. PMH: quadriplegia due to central cord syndrome of C6 secondary to neck  fracture, HTN, depression with anxiety, neurogenic bladder, bilateral DVT on Xarelto  (s/p of IVC filter placement). Prior history of significant PT/OT with improvement of function. Pt has full use of shoulder/elbows, limited use of hands, adaptive self feeding sucessful.     PAIN:  None current but does endorse some left thigh pain intermittent  OBJECTIVE:   TODAY'S TREATMENT:   06/09/23     Therapeutic Activities: dynamic therapeutic activities designed to achieve improved functional performance -all activities performed in chair today.  Resistive trunk ext (manual) 2 x15 Active abd crunch 2 x 15  Manual resistive Leg press 2 x 12 reps  Active knee ext 2 x 15 reps  Active quad sets (to improve with terminal knee ext with standing) 5 sec hold x 15 reps  Seated Hip abd+SLR (Active assistive) 2 x 10 reps Seated hip march Trying to lift foot off foot rest of chair 2 x 10 - min assist   PATIENT EDUCATION: Education details: Exercise technique Patient verbalized understanding.   HOME EXERCISE PROGRAM: No changes at this time  Access Code: [facility] URL: [email or web address] Date: 11/23/2021 Prepared by: Marina  Moser Exercises - Seated Gluteal Sets - 2 x daily - 7 x weekly - 2 sets - 10 reps - 5 hold - Seated Quad Set - 2 x daily - 7 x weekly - 2 sets - 10 reps - 5 hold - Seated Long Arc Quad - 2 x daily - 7 x weekly - 2 sets - 10 reps - 5 hold - Seated March - 2 x daily - 7 x weekly - 2 sets - 10 reps - 5 hold - Seated Hip Abduction - 2 x daily - 7 x weekly - 2 sets - 10 reps - 5 hold - Seated Shoulder Shrugs - 2 x daily - 7 x weekly - 2 sets - 10 reps - 5 hold - Wheelchair Pressure Relief - 2 x daily - 7 x weekly - 2 sets - 10 reps - 5 hold  ASSESSMENT:   GOALS: Goals reviewed with patient? Yes  SHORT TERM GOALS: Target date: 02/22/2022  Pt will be independent with initial UE strengthening HEP in order to improve strength and balance in order to decrease fall risk and  improve function at home and work. Baseline: 10/19/2021- patient with no formal UE HEP; 12/02/2021= Patient verbalized knowledge of HEP including use of theraband for UE strengthening.  Goal status: GOAL MET  LONG TERM GOALS: Target date: 06/20/2023  Pt will be independent with final for UE/LE HEP in order to improve strength and balance in order to decrease fall risk and improve function at home and work. Baseline: Patient is currently BLE NWB and unable to participate in HEP. Has order for UE strengthening. 01/11/2022- Patient now able to participate in LE strengthening although NWB still- good understanding for some basic exercises. Will keep goal active to incorporate progressive LE strengthening exercises. 04/07/2022= Patient still participating in progressive LE seated HEP and has no questions at this  time.  Goal status: MET  2.  Pt will improve FOTO to target score of 35  to display perceived improvements in ability to complete ADL's.  Baseline: 10/19/2021= 12; 12/02/2021= 17; 01/11/2022=17; 04/07/2022= 17; 07/05/2022=17; 10/06/2022- outcome measure not appropriate as patient in non-ambulatory and not consistently standing. Goal status: Goal not appropriate  3.  Pt will increase strength of B UE  by at least 1/2 MMT grade in order to demonstrate improvement in strength and function  Baseline: patient has range of 2-/5 to 4/5 BUE Strength; 12/02/2021= 4/5 except with wrist ext. 01/11/2022= 4/5 B UE strength expept for wrist Goal status: Goal revised-no longer appropriate- working with OT on all UE strengthening.   4. Pt. Will increase strength of RLE by at least 1/2 MMT grade in order to demonstrate improvement in Standing/transfers.  Baseline: 2-/5 Right hip flex/knee ext/flex; 04/07/2022= 2- with right hip flex/knee ext (lacking 28 deg from zero) 4/8= Patient able to ext right knee lacking 18 deg from zero. 07/05/2022=Left knee approx 8 deg from zero and right knee = 23 deg from zero; 12/29/2022 - Left LE =  full ROM 4-/5 and right knee ext= 23 deg from zero= 2+/5 (gravity minimized); 03/28/2023= Left knee ext=  4/5 and right knee ext= 3-/5 (able to move lower leg through incomplete ROM -lacking 20 deg from full ROM against gravity); 05/23/2023= right knee ext= 3-/5 (able to move lower leg through incomplete ROM -lacking 17 deg from full ROM against gravity)  Goal status: Progressing  5. Pt. Will demo ability to stand pivot transfer with max assist for improved functional mobility and less dependent need on mechanical device.   Baseline: Dependent on hoyer lift for all transfers; 04/07/2022- Unable to test secondary to patient with recent UTI and right hand procedure - will attempt to reassess next visit. ; 04/12/22: Unable to successfully stand due to feet plates on loaned power w/c; 05/10/2022- Patient still having to use rental chair and has not received her original power w/c back to practice SPT. 07/04/2021= Patient just received new power w/c and attempted standing from 1st time today- Patient able to stand with max assist today from w/c and did not attempt to perform pivot transfer- difficulty with static standing and placing weight on right LE. 10/06/2022- Not assessed today- patient needs new AFO prior to attempting transfers; 12/29/2022= Patient has not attempted SPT yet - still trying to improve LE strength enough to stand well without knee buckling with weight shifting. 03/28/2023- not attempted today due to ill fitting brace but patient last performed SPT on 01/10/23 and was able to perform with Max A with feet pre-positioned to angle toward direction of transfer; 05/23/2023- Not tested today but on last visit- patient able to perform SPT with max assist yet concern for twisting knee as she was unable to turn/pivot foot .  Goal status: ONGOING  6. Pt. Will demonstrate improved functional LE strength as seen by ability to stand > 2 min for improved transfer ability and pregait abilities.   Baseline: not assessed  today due to recent dx: UTI; 04/07/2022- Unable to test secondary to patient with recent UTI and right hand procedure - will attempt to reassess next visit; 04/12/22: Unable to successfully stand due to feet plates on loaned power w/c 05/10/2022- Patient still having to use rental chair and has not received her original power w/c back to practice Standing. 07/05/2022- Patient able to stand with max assist at trunk today for 2 min 3 sec. Will  keep goal active to ensure consistency. 10/06/2022= Unable to assess today- patient with some recent blood pressure issues and needs new AFO. 11/24/2022= Patient demonstrated multiple rounds of standing in // bars- holding up to 1:45 min today but has been able to exhibit 2 min in recent past- working on consistency. 12/29/2022= 2 min 40 sec with patient demonstrating improved overall posture and ability to contract glutes and quads with just min assist at trunk today.   Goal status: MET  7. Pt. Will demonstrate improved functional LE strength as seen by ability to stand > 5 min for improved transfer ability and pregait abilities. Baseline: 12/29/2022= 2 min 40 sec with patient demonstrating improved overall posture and ability to contract glutes and quads with just min assist at trunk today. 03/28/2023= Attempted 2 trials today - at best 2 min 48 sec today but during cert- did stand for 3 min 44 sec.  Goal Status: Ongoing- Patient will likely receive new AFO in upcoming weeks and anticipate more standing time once received.  8. Patient will perform sit to stand transfer with moderate assist consistently > 75% of time for improved transfer ability and less dependence on caregiver.   Baseline: 12/29/2022- Patient currently max assist level assistance with all sit to stand activities. 03/28/2023= Patient is still majority of max assist to stand yet able to pull up from wheelchair smoother in one motion.   Goal status: ONGOING   ASSESSMENT:  CLINICAL IMPRESSION:   Patient  returns today reporting feeling much better. She was able to increase her reps with functional activities and less rest breaks overall today. Will plan to resume standing next visit as appropriate. Pt will continue to benefit from skilled PT services to optimize independence and reduced caregiver support.   OBJECTIVE IMPAIRMENTS Abnormal gait, decreased activity tolerance, decreased balance, decreased coordination, decreased endurance, decreased mobility, difficulty walking, decreased ROM, decreased strength, hypomobility, impaired flexibility, impaired UE functional use, postural dysfunction, and pain.   ACTIVITY LIMITATIONS carrying, lifting, bending, sitting, standing, squatting, sleeping, stairs, transfers, bed mobility, continence, bathing, toileting, dressing, self feeding, reach over head, hygiene/grooming, and caring for others  PARTICIPATION LIMITATIONS: meal prep, cleaning, laundry, medication management, personal finances, interpersonal relationship, driving, shopping, community activity, and yard work  PERSONAL FACTORS Age, Time since onset of injury/illness/exacerbation, and 1-2 comorbidities: HTN, cervical Sx are also affecting patient's functional outcome.   REHAB POTENTIAL: Good  CLINICAL DECISION MAKING: Evolving/moderate complexity  EVALUATION COMPLEXITY: Moderate  PLAN: PT FREQUENCY: 1-2x/week  PT DURATION: 12 weeks  PLANNED INTERVENTIONS: Therapeutic exercises, Therapeutic activity, Neuromuscular re-education, Balance training, Gait training, Patient/Family education, Self Care, Joint mobilization, DME instructions, Dry Needling, Electrical stimulation, Wheelchair mobility training, Spinal mobilization, Cryotherapy, Moist heat, and Manual therapy  PLAN FOR NEXT SESSION:  LE and core strength training, Sit to stand and static standing; progress to SPT as able.  Reassess next visit with new brace.   2:36 PM, 06/09/23  2:36 PM, 06/09/23 Ossie Blend, PT Physical  Therapist - [facility] [place]  Outpatient Physical Therapy- [facility] Campus [phone]

## 2023-06-13 ENCOUNTER — Ambulatory Visit: Payer: Medicare HMO | Admitting: Occupational Therapy

## 2023-06-13 ENCOUNTER — Ambulatory Visit: Payer: Medicare HMO

## 2023-06-15 ENCOUNTER — Ambulatory Visit: Payer: Medicare HMO | Admitting: Occupational Therapy

## 2023-06-15 ENCOUNTER — Ambulatory Visit: Payer: Medicare HMO

## 2023-06-15 DIAGNOSIS — R262 Difficulty in walking, not elsewhere classified: Secondary | ICD-10-CM

## 2023-06-15 DIAGNOSIS — R2689 Other abnormalities of gait and mobility: Secondary | ICD-10-CM

## 2023-06-15 DIAGNOSIS — M6281 Muscle weakness (generalized): Secondary | ICD-10-CM

## 2023-06-15 DIAGNOSIS — S72001A Fracture of unspecified part of neck of right femur, initial encounter for closed fracture: Secondary | ICD-10-CM

## 2023-06-15 DIAGNOSIS — S14129S Central cord syndrome at unspecified level of cervical spinal cord, sequela: Secondary | ICD-10-CM

## 2023-06-15 DIAGNOSIS — R278 Other lack of coordination: Secondary | ICD-10-CM

## 2023-06-15 NOTE — Therapy (Signed)
 Occupational Therapy  Neuro Treatment Note  Patient Name: Sherry [name] MRN: [id number] DOB:Sep 19, 1934, 89 y.o., female Today's Date: 11/29/2022  PCP:  Waunita Haff PROVIDER: Valrie Gehrig, MD  END OF SESSION:   OT End of Session - 06/15/23 1217     Visit Number 124    Number of Visits 192    Date for OT Re-Evaluation 08/17/23    OT Start Time 1100    OT Stop Time 1145    OT Time Calculation (min) 45 min    Equipment Utilized During Treatment powered wheelchair    Activity Tolerance Patient tolerated treatment well    Behavior During Therapy WFL for tasks assessed/performed                         Past Medical History:  Diagnosis Date   Acute blood loss anemia    Arthritis    Cancer (HCC)    skin   Central cord syndrome at C6 level of cervical spinal cord (HCC) 11/29/2017   Hypertension    Protein-calorie malnutrition, severe (HCC) 01/24/2018   S/P insertion of IVC (inferior vena caval) filter 01/24/2018   Tetraparesis (HCC)    Past Surgical History:  Procedure Laterality Date   ANTERIOR CERVICAL DECOMP/DISCECTOMY FUSION N/A 11/29/2017   Procedure: Cervical five-six, six-seven Anterior Cervical Decompression Fusion;  Surgeon: Cannon Champion, MD;  Location: [facility] OR;  Service: Neurosurgery;  Laterality: N/A;  Cervical five-six, six-seven Anterior Cervical Decompression Fusion   CATARACT EXTRACTION     FEMUR IM NAIL Left 08/16/2021   Procedure: INTRAMEDULLARY (IM) NAIL FEMORAL;  Surgeon: Marlynn Singer, MD;  Location: [facility] ORS;  Service: Orthopedics;  Laterality: Left;   IR IVC FILTER PLMT / S&I /IMG GUID/MOD SED  12/08/2017   Patient Active Problem List   Diagnosis Date Noted   Age-related osteoporosis without current pathological fracture 07/27/2022   Mild recurrent major depression (HCC) 07/27/2022   Hypertensive heart disease with other congestive heart failure (HCC) 02/14/2022   Chronic indwelling Foley catheter 02/14/2022   Closed hip fracture  (HCC) 08/15/2021   DVT (deep venous thrombosis) (HCC) 08/15/2021   Quadriplegia (HCC) 08/15/2021   Fall 08/15/2021   Constipation due to slow transit 08/31/2018   Trauma 06/05/2018   Neuropathic pain 06/05/2018   Neurogenic bowel 06/05/2018   Vaginal yeast infection 01/30/2018   Healthcare-associated pneumonia 01/25/2018   Chronic allergic rhinitis 01/24/2018   Depression with anxiety 01/24/2018   UTI due to Klebsiella species 01/24/2018   Protein-calorie malnutrition, severe (HCC) 01/24/2018   S/P insertion of IVC (inferior vena caval) filter 01/24/2018   Tetraparesis (HCC) 01/20/2018   Neurogenic bladder 01/20/2018   Reactive depression    Benign essential HTN    Acute postoperative anemia due to expected blood loss    Central cord syndrome at C6 level of cervical spinal cord (HCC) 11/29/2017   Allergy to alpha-gal 11/25/2016   SCC (squamous cell carcinoma) 04/08/2014   ONSET DATE: 11/29/2017  REFERRING DIAG: Central Cord Syndrome at C6 of the Cervical Spinal Cord, Fall with Bilateral Closed Hip Fractures, with ORIF repair with Intramedullary Nailing of the Left Hip Fracture.   THERAPY DIAG:  Muscle weakness (generalized)  Other lack of coordination  Rationale for Evaluation and Treatment: Rehabilitation  SUBJECTIVE:  SUBJECTIVE STATEMENT:  Pt. reports having had a nice Mother's Day lunch on Monday with her family.  Pt accompanied by: self, Personal Care aide  PERTINENT HISTORY: Central Cord Syndrome at C6 of the Cervical  Spinal Cord, Fall with Bilateral Closed Hip Fractures, with ORIF repair with Intramedullary Nailing of the Left Hip Fracture.   PRECAUTIONS: None  WEIGHT BEARING RESTRICTIONS: No  PAIN:  Are you having pain? No pain, just shoulder stiffness   LIVING ENVIRONMENT: Lives with: [facility] Stairs: No Has following equipment at home: Wheelchair (power)  PLOF: Independent   PATIENT GOALS:  See below for established  goals  OBJECTIVE:  Note: Objective measures were completed at Evaluation unless otherwise noted.  HAND DOMINANCE: Right  ADLs: Caregiver assist with ADLs, and [facility]  MOBILITY STATUS: Uses a power w/c  ACTIVITY TOLERANCE: Activity tolerance: Fair  FUNCTIONAL OUTCOME MEASURES:  Measurements:   12/09/2021:   Shoulder flexion: R: 120(136), L: 138(150) Shoulder abduction: R: 108(120), L: 110(120) Elbow: R: 0-148, L: 0-146 Wrist flexion: R: 55(62), L: 78 Writ extension: R: 34(55), L: 3(4) RD: R: 12(20), L: 16(26) UD: R: 8(24), L: 6(18) Thumb radial abduction: R: 11(20), L: 8(20) Digit flexion to the [facility]: R:  2nd: 5cm(3.5cm), 3rd: 7.5 cm(5cm), 4th: 7cm(3cm), 5th: 6cm(5.5cm) L: 2nd: 8cm(8cm), 3rd: 8cm(8cm), 4th: 7.5cm(7cm), 5th: 7cm(7cm)   02/03/2022:   Shoulder flexion: R: 122(138), L: 148(154) Shoulder abduction: R: 109(125), L: 125(125 Elbow: R: 0-148, L: 0-146 Wrist flexion: R: 60(68), L: 79 Writ extension: R: 40(55), L: 3(10) RD: R: 14(24), L: 20(28) UD: R: 8(24), L: 6(18) Thumb radial abduction: R: 20(25), L: 8(20) Digit flexion to the [facility]: R:  2nd: 4.5cm(3cm), 3rd: 6.5cm(4.5cm), 4th: 6.5 cm(3cm), 5th: 4.5cm(5cm) L: 2nd: 8cm(8cm), 3rd: 8cm(7cm), 4th: 7.5cm(7cm), 5th: 6.5cm(6.5cm)   04/07/2022:   Shoulder flexion: R: 125(150), L: 160(160) Shoulder abduction: R: 109(125), L: 125(125 Elbow: R: 0-148, L: 0-146 Wrist flexion: R: 60(68), L: 79 Writ extension: R: 40(55), L: 5(18) RD: R: 14(24), L: 20(28) UD: R: 18(24), L: 8(18) Thumb radial abduction: R: 20(25), L: 8(20) Digit flexion to the [facility]: R:  2nd: 4.5 cm(4 cm), 3rd: 6.5cm(3cm), 4th: 7 cm (5cm), 5th: 5cm(5cm) L: 2nd: 8cm(8cm), 3rd: 8cm(7cm), 4th: 7.5cm(7cm), 5th: 7cm(7cm)   07/05/2022:   Shoulder flexion: R: 125(154), L: 160(160) Shoulder abduction: R: 95(130), L: 143(143) Elbow: R: 0-148, L: 0-146 Wrist flexion: R: 60(68), L: 79 Writ extension: R: 40(60), L: 0(12) RD: R: 10(24), L: 12(20) UD:  R: 16(24), L: 8(16) Thumb radial abduction: R: 20(25), L: 8(20) Digit flexion to the [facility]: R:  2nd: 4 cm(4cm), 3rd: 6.5cm( 3.5cm), 4th: 7 cm (5cm), 5th: 6cm(5cm) L: 2nd: 8cm(8cm), 3rd: 8cm(7cm), 4th: 8cm(7cm), 5th: 7cm(7cm)   09/13/2022:   Shoulder flexion: R: 125(154), L: 160(160) Shoulder abduction: R: 100(130), L: 143(143) Elbow: R: 0-150, L: 0-150 Wrist flexion: R: 55(68), L: 79 Writ extension: R: 45(60), L: 0(12) RD: R: 10(24), L: 12(20) UD: R: 16(24), L: 8(16) Thumb radial abduction: R: 20(25), L: 8(20) Digit flexion to the [facility]: R:  2nd: 4 cm(4cm), 3rd: 6cm( 5cm), 4th: 6 cm (4cm), 5th: 5cm(5cm) L: 2nd: 7cm(cm), 3rd: 7cm(6cm), 4th: 7cm(6cm), 5th: 6cm(6cm)   10/27/2022   Shoulder flexion: R: 128(154), L: 160(160) Shoulder abduction: R: 105(130), L: 143(143) Elbow: R: 0-150, L: 0-150 Wrist flexion: R: 55(68), L: 79 Writ extension: R: 46(64), L: 0(20) RD: R: 10(20), L: 18(24) UD: R: 18(28), L: 8(20) Thumb radial abduction: R: 30(42), L:20(24) Digit flexion to the [facility]: R:  2nd: 4 cm(3cm), 3rd: 6cm( 6cm), 4th: 6 cm (5cm), 5th: 5cm(5cm) L: 2nd: 8cm(8cm), 3rd: 8cm(6cm), 4th: 8cm(7cm), 5th: 6cm(6cm)  01/19/2023  Shoulder flexion: R: 131(154), L: 160(160) Shoulder abduction: R:  110(130), L: 143(143) Elbow: R: 0-150, L: 0-150 Wrist flexion: R: 55(68), L: 79 Writ extension: R: 46(64), L: 0(20) RD: R: 10(20), L: 18(24) UD: R: 18(28), L: 14(20) Thumb radial abduction: R: 38(48), L:24(26) Thumb IP flexion: R: 35(50) L: 20(45) Digit flexion to the [facility]: R:  2nd: 3.5 cm(3cm), 3rd: 6cm( 6cm), 4th: 6 cm (5cm), 5th: 5cm(4.5cm) L: 2nd: 8cm(7.5cm), 3rd: 8cm(6cm), 4th: 7.5cm(6cm), 5th: 6cm(4.5cm)  04/04/2023  Shoulder flexion: R: 134(154), L: 160(160) Shoulder abduction: R: 114(130), L: 143(143) Elbow: R: WNL, L: WNL Wrist flexion: R: 55(68), L: 80 Writ extension: R: 46(64), L: -10(10) RD: R: 14(24), L: 20(24) UD: R: 20(30), L: 14(20) Thumb radial abduction: R: 38(48),  L:28(30) Thumb IP flexion: R: 40(50) L: 25(45) Digit flexion to the [facility]: R:  2nd: 3.5 cm(3cm), 3rd: 6cm( 5cm), 4th: 6 cm (4cm), 5th: 5cm(5cm) L: 2nd: 8cm(7.5cm), 3rd: 8cm(7cm), 4th: 7.5cm(7cm), 5th: 7cm(6cm)  05/25/2023  Shoulder flexion: R: 134(154), L: 160(160) Shoulder abduction: R: 117(134), L: 143(143) Elbow: R: WNL, L: WNL Wrist flexion: R: 55(68), L: 80 Wrist extension: R: 46(64), L: -8(14) RD: R: 14(24), L: 20(24) UD: R: 20(32), L: 14(22) Thumb radial abduction: R: 38(50), L:28(30) Thumb IP flexion: R: 40(50) L: 30(45) Digit flexion to the [facility]: R:  2nd: 3.5 cm(3cm), 3rd: 6cm( 5cm), 4th: 7 cm (4cm), 5th: 5cm(4cm) L: 2nd: 8cm(7.5cm), 3rd: 8cm(7cm), 4th: 7.5cm(7cm), 5th: 7cm(6cm)  COORDINATION:   01/19/2023:  9 hole peg test:  Right: 2 pegs placed in 1 min. & 5 sec.  Pt. was able to remove 9 pegs in 17 sec.  Left: 2 pegs placed in 1 min. & 10 sec. Pt. was able to remove 9 pegs in 20 sec.  04/04/23:  Pt. was able to consistently grasp the pegs however each one slipped out of her fingers when attempting to place them into the Pegboard.  05/25/2023:   Right: 9 pegs placed and removed in 2 min. & 41 sec. Left: Unable to grasp the pegs for a horizontal position.  SENSATION: Intact  EDEMA: N/A   COGNITION: Overall cognitive status: WNL   VISION: Subjective report: Wears glasses, No changes in vision.  PERCEPTION: Intact  TODAY'S TREATMENT:                                                                                                                              DATE:  06/15/23  Moist heat for Bilateral shoulder shoulders, and bilateral hands for muscle relaxation used simultaneous to passive stretching noted below  Therapeutic Ex.:  -Pt. tolerated PROM followed by AROM bilateral wrist extension, PROM for bilateral digit MP, PIP, and DIP flexion, and extension, thumb abduction.   Neuromuscular re-education:   -Facilitated right hand FMC skills grasping 1",  3/4", and 1/2" flat washers against resistance from a magnetic dish.   -Pt. worked Biomedical engineer 2nd digit extension to slide the up to the edge of the dish to he thumb  -Pt. worked on Editor, commissioning  them from a soft, flat surface.  Therapeutic Activities:  -Facilitated RUE  functional reaching out into multiple planes combined with [facility] releasing washers onto a dowel target. -Facilitated grasping patterns with the RUE sustained in elevation at various heights.     PATIENT EDUCATION: Education details: Bilateral hand and shoulder ROM Person educated: Patient Education method: Explanation, Demonstration, and Verbal cues Education comprehension: verbalized understanding and returned demonstration  HOME EXERCISE PROGRAM: Continue to assess HEP needs, and provide as needed.   GOALS: Goals reviewed with patient? Yes  LONG TERM GOALS: Target date: 08/17/2023  3.  Patient will independently use a reacher to pick up items from various surfaces.  Baseline: 05/25/2023:  Pt. is now able to initiate grasping a reacher to open and close the reacher with the right hand, however requires the grasp handle to be repositioned  within the hand. Pt. requires work on using/moving the reacher with the right hand through various planes. 04/04/2023: Pt. continues to work on improving Digit flexion to the [facility] to be able to efficiently handle Adaptive equipment. 01/19/2023: Pt. Is improving with Bilateral digit flexion to the [facility]. Pt. has difficulty firmly holding adaptive devices. 12/06/2022: Pt. continues to present with increased MP, PIP, and DIP digit tightness/stiffness limiting the formulation of bilateral composites fists. 10/27/2022: Pt. presents with increased MP, PIP, and DIP digit tightness/stiffness limiting the formulation of bilateral composites fists during this progress reporting period limiting. Digit flexion to the [facility]: R:  2nd: 4 cm(3cm), 3rd: 6cm( 6cm), 4th: 6 cm (5cm), 5th: 5cm(5cm), L: 2nd: 8cm(8cm),  3rd: 8cm(6cm), 4th: 8cm(7cm), 5th: 6cm(6cm) 09/20/2022: Improving digit flexion to the [facility]. 09/13/22: improved digit flexion to the [facility]. 07/05/2022: Pt. presents with digit MP, PIP, and DIP extensor tightness limiting her ability to securely grip objects in her bilateral hands.  04/07/2022: Pt. Has improved with right 2nd, and 3rd digit flexion towards the [facility]. Pt. Continues to have difficulty securely holding and applying deodorant.02/03/2022: Pt. Has improved with digit flexion, however, continues to have difficulty securely holding and using deodorant. 12/09/2021: Bilateral hand/digit MP, PIP, and DIP extension tightness limits her ability achieve digit flexion to hold, and apply deodorant. 10/28/2021:  Pt. continues to have difficulty holding the deodorant. 01/12/2021: Pt. Presents with limited digit extension. Pt. Is able to initiate holding deodorant, however is unable to hold it while using it  Goal status: Ongoing   4.  Pt. Will improve bilateral wrist extension in preparation for anticipating, and initiating reaching for objects at the table.  Baseline: 05/25/23: Wrist extension: R: 46(64), L: -8(14) 04/04/23: R: 46(64) L: -10(10)01/19/2023:  R: 46(64) L: 0(20) 12/06/2022: Pt continues to to progress with bilateral wrist extension in preparation fro functional reaching. 10/27/2022: Pt. Is improving with bilateral wrist extension. R: 46(64) L: 0(20) 09/20/2022: limited PROM in the Left wrist extension 2/2 tightness/stiffness. 09/13/22: R: 55(68), L: 0(12) 07/19/2022: Bilateral wrist extension in limited. 07/05/2022: Right: 40(60) Left: 0(12) 04/07/2022: Right: 40(55) Left: 5(18) 02/03/2022: Right: 34(55) Left: 3(4) 12/09/2021: Right  34(55)  Left  3(4) 10/28/2021:  Right 22(38), Left 0(15) 09/14/2021:  Right: 17(35), left 2(15)  Goal status: Progressing/Ongoing  7.  Pt. Will increase bilateral lateral pinch strength by 2 lbs to be able to securely grasp items during ADLs, and IADL tasks.  Baseline: 01/19/2023:  Deferred as the Pinch meter is out for calibration.12/06/2022: Pt. Is able to more securely hold objects during ADLs/IADLs 10/27/2022: Pt. Is improving with holding items with her bilateral thumbs. (Pinch meter out for calibration)  09/20/2022: lateral pinch continues to be limited 09/13/22: R 6.5# L 4# 07/19/2022: TBD 07/05/2022: TBD 07/2022: NT-Pinch meter out for calibration. 02/03/2022: Right: 6#, Left: 4# 12/09/2021: Right: 6#, Left: 4#  Goal status: Deferred   9.  Pt. will complete plant care with modified independence.  Baseline:12/06/2022: Pt. Is independent with plant care in her husband's room, however is not able to access her plants due to furniture blocking access to them. 10/27/2022: Pt. Is able to water plants that are closer, and within reach. Pt. continues to have difficulty reaching for thorough plant care. 09/20/2022: Pt. Continues to have difficulty reaching for thorough plant care. 09/13/22: continues to report intermittent difficulty 07/19/2022: Pt. Continues to be able to water, and care for some of her plants. Pt. Has more difficulty with plants that are harder to reach. 07/05/2022: pt. Is able to water, and care for some of her plants. Pt. Has more difficulty with plants that are harder to reach. 04/07/2022: Pt. is now able to hold a cup and water her plants.02/03/2022: Pt. has difficulty caring for her plants.  Goal status:Achieved   10.  Pt. will demonstrate adaptive techniques to assist with the efficiency of self-dressing, or morning care tasks.  Baseline: 05/25/2023: Pt. continues to work on improving UE functioning to be able to efficiently use Adaptive equipment.  04/04/2023:  Pt. continues to work on improving UE functioning to be able to efficiently use Adaptive equipment. 01/19/2203: Pt. Is able to donn her jacket in reverse independently. Pt. requires assist from staff, as Pt. Reports staff have decreased time. 12/06/2022: Pt. requires assist from staff, as Pt. Reports staff have  decreased time. 10/27/2022: Pt. is able to assist with initiating UE dressing. Pt. requires assist from personal, and staff care aides. 09/20/2022: Pt. continues to require assist form personal, and staff care aides. 09/13/22: MODA dressing, reports she is often rushed 07/19/2022: Pt. continues to require assist with self-dressing/morning care tasks. 07/05/2022: Continue with goal. 04/07/2022: Pt. Continues to require assist with the efficiency of self-dressing, and morning care tasks.02/03/2022: Pt. requires assist from caregivers 2/2 time limitations during morning care.  Goal status: Ongoing  11.  Pt. will improve BUE strength to improve ADL, and IADL functioning.  Baseline: 05/25/2023: shoulder flexion L 4/5, R 4-/5; shoulder abduction L 4+/5, R 4-/5. elbow flexion B 5/5, elbow extension 4+/5 01/19/2023: L 4/5, R 3+/5  04/04/2023: shoulder flexion L 4/5, R 3+/5; shoulder abduction L 4+/5, R 3+/5. elbow flexion B 5/5, elbow extension 4/5 01/19/2023: L 4/5, R 3+/5 12/06/2022: Continue 10/27/2022: shoulder flexion L 4/5, R 3+/5; shoulder abduction L 4+/5, R 3+/5. elbow flexion B 5/5, elbow extension 4/5 09/20/2022: Pt. Presents with limited BUE strength 09/13/22: shoulder flexion L 4/5, R 3/5; elbow flexion B 5/5, elbow extension 4-/5; shoulder abduction L 4+/5, R 3+/5. 07/19/2022: BUE strength continues to be limited. 07/05/2022: shoulder flexion: right 4-/5, abduction: 3+/5, elbow flexion: right: 5/5, left 5/5, extension: right: 4-/5, left 4-/5, wrist extension: right: 3-/5, left: 2-/5  Goal status: Ongoing  12.  Pt. will improve bilateral thumb radial abduction in order to be able to hold the grab bars while standing with PT  Baseline: 05/25/23: Thumb radial abduction: R: 38(50), L:28(30) 04/04/23: R: 38(48), L:28(30) 01/19/2023: R: 38(48) L: 24(26)12/06/2022: Pt. Continues to present  with limited thumb abduction, however is improving holding onto the parallel bars.10/27/2022: thumb radial abduction: R: 30(42), L: 20(24   Goal status: Ongoing  13.  Pt. Will be able to securely hold, and stabilize  medication bottles at a tabletop surface when opening, and closing them.  Baseline: 01/19/2023: Pt. Is now able to securely hold medication bottles while opening them, and stabilizes them on surfaces while opening them. 12/06/2022: Pt is improving with securely stabilizing medication bottles. 10/27/2022: Pt. Stabilizes bottles against her torso when attempting to open, and close them   Goal status: Achieved  14: Pt. Will improve bilateral thumb IP flexion to improve active grasping patterns.    Baseline: 05/25/2023: Thumb IP flexion: R: 40(50) L: 30(45) 04/04/2023:  R: 40(50) L: 25(45) 01/19/2023: Right 35(50), Left: 20/45)    Goal status: Ongoing    15. Pt. Will improve bilateral FMC/speed, and dexterity. As evidence by improved scores on the 9 hole peg test.  Baseline: 05/25/2023: 9 Hole pegs test speed/dexterity: Right: 9 pegs placed and removed in 2 min. & 41 sec. Left: Unable to grasp the pegs for a horizontal position. 04/04/23: Pt. was able to consistently grasp the pegs however each one slipped out of her fingers when attempting to place them into the Pegboard. 01/19/2023: 2 pegs placed in 1 min. & 5 sec.  Pt. was able to remove 9 pegs in 17 sec.; Left: 2 pegs placed in 1 min. & 10 sec. Pt. was able to remove 9 pegs in 20 sec.   Goal status: Ongoing  ASSESSMENT:  CLINICAL IMPRESSION:  Pt. tolerated ROM to the bilateral hand digits well today. Pt. presented with difficulty at times isolating her 2nd digit to remove the washers from the magnetic resistive dish, dropping multiple washers. Pt. Was able to consistently grasp the washers from a soft, flat surface without dropping them. Pt. was able to dual task reaching with [facility], as well as sustain her RUE in elevation while performing [facility] skills removing the washers from the dowel, however does report UE fatigue afterwards. Pt. continues to benefit from OT services to work  on impoving ROM, and UE strength in order to work towards increasing bilateral hand grasp on objects, and increasing engagement of the bilateral hands during ADLs, and IADL tasks.     PERFORMANCE DE PERFORMANCE DEFICITS: in functional skills including ADLs, IADLs, coordination, dexterity, ROM, strength, and UE functional use, cognitive skills including , and psychosocial skills including coping strategies and environmental adaptation.   IMPAIRMENTS: are limiting patient from ADLs, IADLs, and leisure.   CO-MORBIDITIES: may have co-morbidities  that affects occupational performance. Patient will benefit from skilled OT to address above impairments and improve overall function.  MODIFICATION OR ASSISTANCE TO COMPLETE EVALUATION: Min-Moderate modification of tasks or assist with assess necessary to complete an evaluation.  OT OCCUPATIONAL PROFILE AND HISTORY: Detailed assessment: Review of records and additional review of physical, cognitive, psychosocial history related to current functional performance.  CLINICAL DECISION MAKING: Moderate - several treatment options, min-mod task modification necessary  REHAB POTENTIAL: Good for stated goals  PLAN:  OT FREQUENCY 2x's a week  OT DURATION: 12 weeks  PLANNED INTERVENTIONS ADL training, A/E training, UE ther. Ex, Manual therapy, neuromuscular re-education, moist heat modality, Paraffin Bath, Splinting, and  Pt./caregiver education   RECOMMENDED OTHER SERVICES: PT  CONSULTED AND AGREED WITH PLAN OF CARE: Patient  PLAN FOR NEXT SESSION: Continue Treatment as per established POC  Duey Ghent, MS, OTR/L   06/15/23, 12:19 PM

## 2023-06-15 NOTE — Therapy (Signed)
 OUTPATIENT PHYSICAL THERAPY NEURO TREATMENT   Patient Name: Sherry [name] MRN: [id number] DOB:12-11-1934, 89 y.o., female Today's Date: 06/15/2023   PCP: Valrie Gehrig, MD REFERRING PROVIDER: Marlynn Singer   PT End of Session - 06/15/23 1258     Visit Number 114    Number of Visits 124    Date for PT Re-Evaluation 06/20/23    Progress Note Due on Visit 120    PT Start Time 1147    PT Stop Time 1228    PT Time Calculation (min) 41 min    Equipment Utilized During Treatment Gait belt    Activity Tolerance Patient tolerated treatment well;No increased pain    Behavior During Therapy WFL for tasks assessed/performed                        Past Medical History:  Diagnosis Date   Acute blood loss anemia    Arthritis    Cancer (HCC)    skin   Central cord syndrome at C6 level of cervical spinal cord (HCC) 11/29/2017   Hypertension    Protein-calorie malnutrition, severe (HCC) 01/24/2018   S/P insertion of IVC (inferior vena caval) filter 01/24/2018   Tetraparesis (HCC)    Past Surgical History:  Procedure Laterality Date   ANTERIOR CERVICAL DECOMP/DISCECTOMY FUSION N/A 11/29/2017   Procedure: Cervical five-six, six-seven Anterior Cervical Decompression Fusion;  Surgeon: Cannon Champion, MD;  Location: [facility] OR;  Service: Neurosurgery;  Laterality: N/A;  Cervical five-six, six-seven Anterior Cervical Decompression Fusion   CATARACT EXTRACTION     FEMUR IM NAIL Left 08/16/2021   Procedure: INTRAMEDULLARY (IM) NAIL FEMORAL;  Surgeon: Marlynn Singer, MD;  Location: [facility] ORS;  Service: Orthopedics;  Laterality: Left;   IR IVC FILTER PLMT / S&I /IMG GUID/MOD SED  12/08/2017   Patient Active Problem List   Diagnosis Date Noted   Age-related osteoporosis without current pathological fracture 07/27/2022   Mild recurrent major depression (HCC) 07/27/2022   Hypertensive heart disease with other congestive heart failure (HCC) 02/14/2022   Chronic indwelling Foley  catheter 02/14/2022   Closed hip fracture (HCC) 08/15/2021   DVT (deep venous thrombosis) (HCC) 08/15/2021   Quadriplegia (HCC) 08/15/2021   Fall 08/15/2021   Constipation due to slow transit 08/31/2018   Trauma 06/05/2018   Neuropathic pain 06/05/2018   Neurogenic bowel 06/05/2018   Vaginal yeast infection 01/30/2018   Healthcare-associated pneumonia 01/25/2018   Chronic allergic rhinitis 01/24/2018   Depression with anxiety 01/24/2018   UTI due to Klebsiella species 01/24/2018   Protein-calorie malnutrition, severe (HCC) 01/24/2018   S/P insertion of IVC (inferior vena caval) filter 01/24/2018   Tetraparesis (HCC) 01/20/2018   Neurogenic bladder 01/20/2018   Reactive depression    Benign essential HTN    Acute postoperative anemia due to expected blood loss    Central cord syndrome at C6 level of cervical spinal cord (HCC) 11/29/2017   Allergy to alpha-gal 11/25/2016   SCC (squamous cell carcinoma) 04/08/2014    ONSET DATE: 08/15/2021 (fall with B hip fx); Initial injury was in 2019- quadraplegia due to central cord syndrome of C6 secondary to neck fracture.   REFERRING DIAG: Bilateral Hip fx; Left Open- s/p Left IM nail femoral on 08/16/2021; Right closed  THERAPY DIAG:  Muscle weakness (generalized)  Other lack of coordination  Central cord syndrome, sequela (HCC)  Other abnormalities of gait and mobility  Difficulty in walking, not elsewhere classified  Closed fracture of both hips, initial encounter (  HCC)  Rationale for Evaluation and Treatment Rehabilitation  SUBJECTIVE:   SUBJECTIVE STATEMENT:    Patient reports feeling better overall  and denies pain today.         Pt accompanied by: Paid caregiver-   PERTINENT HISTORY: Sherry [name] is an 87yoF who experienced a fall at home on 08/15/2021 with hip fracture, s/p Left hip ORIF. PMH: quadriplegia due to central cord syndrome of C6 secondary to neck fracture, HTN, depression with anxiety, neurogenic bladder,  bilateral DVT on Xarelto  (s/p of IVC filter placement). Prior history of significant PT/OT with improvement of function. Pt has full use of shoulder/elbows, limited use of hands, adaptive self feeding sucessful.     PAIN:  None current but does endorse some left thigh pain intermittent  OBJECTIVE:   TODAY'S TREATMENT:   06/15/23     Therapeutic Activities: dynamic therapeutic activities designed to achieve improved functional performance -all activities performed in chair today.  Resistive trunk ext (manual) 2 x15 Active abd crunch 2 x 15  Manual resistive Leg press 2 x 12 reps  Active knee ext 2 x 15 reps  Active quad sets (to improve with terminal knee ext with standing) 5 sec hold x 15 reps  Seated Hip abd+SLR (Active assistive) 2 x 10 reps Seated hip march Trying to lift foot off foot rest of chair 2 x 10 - min assist   PATIENT EDUCATION: Education details: Exercise technique Patient verbalized understanding.   HOME EXERCISE PROGRAM: No changes at this time  Access Code: [facility] URL: [email or web address] Date: 11/23/2021 Prepared by: Ena Harries Exercises - Seated Gluteal Sets - 2 x daily - 7 x weekly - 2 sets - 10 reps - 5 hold - Seated Quad Set - 2 x daily - 7 x weekly - 2 sets - 10 reps - 5 hold - Seated Long Arc Quad - 2 x daily - 7 x weekly - 2 sets - 10 reps - 5 hold - Seated March - 2 x daily - 7 x weekly - 2 sets - 10 reps - 5 hold - Seated Hip Abduction - 2 x daily - 7 x weekly - 2 sets - 10 reps - 5 hold - Seated Shoulder Shrugs - 2 x daily - 7 x weekly - 2 sets - 10 reps - 5 hold - Wheelchair Pressure Relief - 2 x daily - 7 x weekly - 2 sets - 10 reps - 5 hold  ASSESSMENT:   GOALS: Goals reviewed with patient? Yes  SHORT TERM GOALS: Target date: 02/22/2022  Pt will be independent with initial UE strengthening HEP in order to improve strength and balance in order to decrease fall risk and improve function at home and work. Baseline: 10/19/2021-  patient with no formal UE HEP; 12/02/2021= Patient verbalized knowledge of HEP including use of theraband for UE strengthening.  Goal status: GOAL MET  LONG TERM GOALS: Target date: 06/20/2023  Pt will be independent with final for UE/LE HEP in order to improve strength and balance in order to decrease fall risk and improve function at home and work. Baseline: Patient is currently BLE NWB and unable to participate in HEP. Has order for UE strengthening. 01/11/2022- Patient now able to participate in LE strengthening although NWB still- good understanding for some basic exercises. Will keep goal active to incorporate progressive LE strengthening exercises. 04/07/2022= Patient still participating in progressive LE seated HEP and has no questions at this time.  Goal status: MET  2.  Pt will  improve FOTO to target score of 35  to display perceived improvements in ability to complete ADL's.  Baseline: 10/19/2021= 12; 12/02/2021= 17; 01/11/2022=17; 04/07/2022= 17; 07/05/2022=17; 10/06/2022- outcome measure not appropriate as patient in non-ambulatory and not consistently standing. Goal status: Goal not appropriate  3.  Pt will increase strength of B UE  by at least 1/2 MMT grade in order to demonstrate improvement in strength and function  Baseline: patient has range of 2-/5 to 4/5 BUE Strength; 12/02/2021= 4/5 except with wrist ext. 01/11/2022= 4/5 B UE strength expept for wrist Goal status: Goal revised-no longer appropriate- working with OT on all UE strengthening.   4. Pt. Will increase strength of RLE by at least 1/2 MMT grade in order to demonstrate improvement in Standing/transfers.  Baseline: 2-/5 Right hip flex/knee ext/flex; 04/07/2022= 2- with right hip flex/knee ext (lacking 28 deg from zero) 4/8= Patient able to ext right knee lacking 18 deg from zero. 07/05/2022=Left knee approx 8 deg from zero and right knee = 23 deg from zero; 12/29/2022 - Left LE = full ROM 4-/5 and right knee ext= 23 deg from zero= 2+/5  (gravity minimized); 03/28/2023= Left knee ext=  4/5 and right knee ext= 3-/5 (able to move lower leg through incomplete ROM -lacking 20 deg from full ROM against gravity); 05/23/2023= right knee ext= 3-/5 (able to move lower leg through incomplete ROM -lacking 17 deg from full ROM against gravity)  Goal status: Progressing  5. Pt. Will demo ability to stand pivot transfer with max assist for improved functional mobility and less dependent need on mechanical device.   Baseline: Dependent on hoyer lift for all transfers; 04/07/2022- Unable to test secondary to patient with recent UTI and right hand procedure - will attempt to reassess next visit. ; 04/12/22: Unable to successfully stand due to feet plates on loaned power w/c; 05/10/2022- Patient still having to use rental chair and has not received her original power w/c back to practice SPT. 07/04/2021= Patient just received new power w/c and attempted standing from 1st time today- Patient able to stand with max assist today from w/c and did not attempt to perform pivot transfer- difficulty with static standing and placing weight on right LE. 10/06/2022- Not assessed today- patient needs new AFO prior to attempting transfers; 12/29/2022= Patient has not attempted SPT yet - still trying to improve LE strength enough to stand well without knee buckling with weight shifting. 03/28/2023- not attempted today due to ill fitting brace but patient last performed SPT on 01/10/23 and was able to perform with Max A with feet pre-positioned to angle toward direction of transfer; 05/23/2023- Not tested today but on last visit- patient able to perform SPT with max assist yet concern for twisting knee as she was unable to turn/pivot foot .  Goal status: ONGOING  6. Pt. Will demonstrate improved functional LE strength as seen by ability to stand > 2 min for improved transfer ability and pregait abilities.   Baseline: not assessed today due to recent dx: UTI; 04/07/2022- Unable to test  secondary to patient with recent UTI and right hand procedure - will attempt to reassess next visit; 04/12/22: Unable to successfully stand due to feet plates on loaned power w/c 05/10/2022- Patient still having to use rental chair and has not received her original power w/c back to practice Standing. 07/05/2022- Patient able to stand with max assist at trunk today for 2 min 3 sec. Will keep goal active to ensure consistency. 10/06/2022= Unable to assess  today- patient with some recent blood pressure issues and needs new AFO. 11/24/2022= Patient demonstrated multiple rounds of standing in // bars- holding up to 1:45 min today but has been able to exhibit 2 min in recent past- working on consistency. 12/29/2022= 2 min 40 sec with patient demonstrating improved overall posture and ability to contract glutes and quads with just min assist at trunk today.   Goal status: MET  7. Pt. Will demonstrate improved functional LE strength as seen by ability to stand > 5 min for improved transfer ability and pregait abilities. Baseline: 12/29/2022= 2 min 40 sec with patient demonstrating improved overall posture and ability to contract glutes and quads with just min assist at trunk today. 03/28/2023= Attempted 2 trials today - at best 2 min 48 sec today but during cert- did stand for 3 min 44 sec.  Goal Status: Ongoing- Patient will likely receive new AFO in upcoming weeks and anticipate more standing time once received.  8. Patient will perform sit to stand transfer with moderate assist consistently > 75% of time for improved transfer ability and less dependence on caregiver.   Baseline: 12/29/2022- Patient currently max assist level assistance with all sit to stand activities. 03/28/2023= Patient is still majority of max assist to stand yet able to pull up from wheelchair smoother in one motion.   Goal status: ONGOING   ASSESSMENT:  CLINICAL IMPRESSION:   Patient returns today reporting feeling much better. She was able  to increase her reps with functional activities and less rest breaks overall today. Will plan to resume standing next visit as appropriate. Pt will continue to benefit from skilled PT services to optimize independence and reduced caregiver support.   OBJECTIVE IMPAIRMENTS Abnormal gait, decreased activity tolerance, decreased balance, decreased coordination, decreased endurance, decreased mobility, difficulty walking, decreased ROM, decreased strength, hypomobility, impaired flexibility, impaired UE functional use, postural dysfunction, and pain.   ACTIVITY LIMITATIONS carrying, lifting, bending, sitting, standing, squatting, sleeping, stairs, transfers, bed mobility, continence, bathing, toileting, dressing, self feeding, reach over head, hygiene/grooming, and caring for others  PARTICIPATION LIMITATIONS: meal prep, cleaning, laundry, medication management, personal finances, interpersonal relationship, driving, shopping, community activity, and yard work  PERSONAL FACTORS Age, Time since onset of injury/illness/exacerbation, and 1-2 comorbidities: HTN, cervical Sx are also affecting patient's functional outcome.   REHAB POTENTIAL: Good  CLINICAL DECISION MAKING: Evolving/moderate complexity  EVALUATION COMPLEXITY: Moderate  PLAN: PT FREQUENCY: 1-2x/week  PT DURATION: 12 weeks  PLANNED INTERVENTIONS: Therapeutic exercises, Therapeutic activity, Neuromuscular re-education, Balance training, Gait training, Patient/Family education, Self Care, Joint mobilization, DME instructions, Dry Needling, Electrical stimulation, Wheelchair mobility training, Spinal mobilization, Cryotherapy, Moist heat, and Manual therapy  PLAN FOR NEXT SESSION:  LE and core strength training, Sit to stand and static standing; progress to SPT as able.  Reassess next visit with new brace.   12:59 PM, 06/15/23  12:59 PM, 06/15/23 Ossie Blend, PT Physical Therapist - [place] [facility]   Outpatient Physical Therapy- [facility] Campus [phone]

## 2023-06-20 ENCOUNTER — Ambulatory Visit: Payer: Medicare HMO

## 2023-06-20 ENCOUNTER — Ambulatory Visit: Payer: Medicare HMO | Admitting: Occupational Therapy

## 2023-06-20 DIAGNOSIS — M6281 Muscle weakness (generalized): Secondary | ICD-10-CM

## 2023-06-20 DIAGNOSIS — R2689 Other abnormalities of gait and mobility: Secondary | ICD-10-CM

## 2023-06-20 DIAGNOSIS — R262 Difficulty in walking, not elsewhere classified: Secondary | ICD-10-CM

## 2023-06-20 DIAGNOSIS — R2681 Unsteadiness on feet: Secondary | ICD-10-CM

## 2023-06-20 DIAGNOSIS — R269 Unspecified abnormalities of gait and mobility: Secondary | ICD-10-CM

## 2023-06-20 DIAGNOSIS — R278 Other lack of coordination: Secondary | ICD-10-CM

## 2023-06-20 DIAGNOSIS — S14129S Central cord syndrome at unspecified level of cervical spinal cord, sequela: Secondary | ICD-10-CM

## 2023-06-20 NOTE — Therapy (Addendum)
 OUTPATIENT PHYSICAL THERAPY NEURO TREATMENT/RECERT   Patient Name: JOELYNN [name] MRN: [id number] DOB:[birth year], 89 y.o., female Today's Date: 06/20/2023   PCP: Valrie Gehrig, MD REFERRING PROVIDER: Marlynn Singer   PT End of Session - 06/20/23 1149     Visit Number 115    Number of Visits 139    Date for PT Re-Evaluation 09/12/23    Progress Note Due on Visit 120    PT Start Time 1146    PT Stop Time 1215    PT Time Calculation (min) 29 min    Equipment Utilized During Treatment Gait belt    Activity Tolerance Patient tolerated treatment well;No increased pain    Behavior During Therapy WFL for tasks assessed/performed                        Past Medical History:  Diagnosis Date   Acute blood loss anemia    Arthritis    Cancer (HCC)    skin   Central cord syndrome at C6 level of cervical spinal cord (HCC) 11/29/2017   Hypertension    Protein-calorie malnutrition, severe (HCC) 01/24/2018   S/P insertion of IVC (inferior vena caval) filter 01/24/2018   Tetraparesis (HCC)    Past Surgical History:  Procedure Laterality Date   ANTERIOR CERVICAL DECOMP/DISCECTOMY FUSION N/A 11/29/2017   Procedure: Cervical five-six, six-seven Anterior Cervical Decompression Fusion;  Surgeon: Cannon Champion, MD;  Location: [facility] OR;  Service: Neurosurgery;  Laterality: N/A;  Cervical five-six, six-seven Anterior Cervical Decompression Fusion   CATARACT EXTRACTION     FEMUR IM NAIL Left 08/16/2021   Procedure: INTRAMEDULLARY (IM) NAIL FEMORAL;  Surgeon: Marlynn Singer, MD;  Location: [facility] ORS;  Service: Orthopedics;  Laterality: Left;   IR IVC FILTER PLMT / S&I /IMG GUID/MOD SED  12/08/2017   Patient Active Problem List   Diagnosis Date Noted   Age-related osteoporosis without current pathological fracture 07/27/2022   Mild recurrent major depression (HCC) 07/27/2022   Hypertensive heart disease with other congestive heart failure (HCC) 02/14/2022   Chronic  indwelling Foley catheter 02/14/2022   Closed hip fracture (HCC) 08/15/2021   DVT (deep venous thrombosis) (HCC) 08/15/2021   Quadriplegia (HCC) 08/15/2021   Fall 08/15/2021   Constipation due to slow transit 08/31/2018   Trauma 06/05/2018   Neuropathic pain 06/05/2018   Neurogenic bowel 06/05/2018   Vaginal yeast infection 01/30/2018   Healthcare-associated pneumonia 01/25/2018   Chronic allergic rhinitis 01/24/2018   Depression with anxiety 01/24/2018   UTI due to Klebsiella species 01/24/2018   Protein-calorie malnutrition, severe (HCC) 01/24/2018   S/P insertion of IVC (inferior vena caval) filter 01/24/2018   Tetraparesis (HCC) 01/20/2018   Neurogenic bladder 01/20/2018   Reactive depression    Benign essential HTN    Acute postoperative anemia due to expected blood loss    Central cord syndrome at C6 level of cervical spinal cord (HCC) 11/29/2017   Allergy to alpha-gal 11/25/2016   SCC (squamous cell carcinoma) 04/08/2014    ONSET DATE: 08/15/2021 (fall with B hip fx); Initial injury was in 2019- quadraplegia due to central cord syndrome of C6 secondary to neck fracture.   REFERRING DIAG: Bilateral Hip fx; Left Open- s/p Left IM nail femoral on 08/16/2021; Right closed  THERAPY DIAG:  Muscle weakness (generalized) - Plan: PT plan of care cert/re-cert  Other lack of coordination - Plan: PT plan of care cert/re-cert  Central cord syndrome, sequela (HCC) - Plan: PT plan of care cert/re-cert  Other abnormalities of gait and mobility - Plan: PT plan of care cert/re-cert  Difficulty in walking, not elsewhere classified - Plan: PT plan of care cert/re-cert  Abnormality of gait and mobility - Plan: PT plan of care cert/re-cert  Unsteadiness on feet - Plan: PT plan of care cert/re-cert  Rationale for Evaluation and Treatment Rehabilitation  SUBJECTIVE:   SUBJECTIVE STATEMENT:    Patient reports no changes over the weekend and agreeable to try to stand for recert visit.          Pt accompanied by: Paid caregiver-   PERTINENT HISTORY: Sherry [name] is an 87yoF who experienced a fall at home on 08/15/2021 with hip fracture, s/p Left hip ORIF. PMH: quadriplegia due to central cord syndrome of C6 secondary to neck fracture, HTN, depression with anxiety, neurogenic bladder, bilateral DVT on Xarelto  (s/p of IVC filter placement). Prior history of significant PT/OT with improvement of function. Pt has full use of shoulder/elbows, limited use of hands, adaptive self feeding sucessful.     PAIN:  None current but does endorse some left thigh pain intermittent  OBJECTIVE:   TODAY'S TREATMENT:   06/20/23   Physical therapy treatment session today consisted of completing assessment of goals and administration of testing as demonstrated and documented in flow sheet, treatment, and goals section of this note. Addition treatments may be found below.   Therapeutic Activities: dynamic therapeutic activities designed to achieve improved functional performance -all activities performed in chair today.   Sit to stand today in // bars (max A from PT- adjust feet and new brace- no slippage today) x 4  Static standing in // bars- max assist +1 with PT blocking knees and pulling patient anterior for improved weight distribution.  Patient able to stand x 2 trials (all less than 45 sec)      PATIENT EDUCATION: Education details: Exercise technique Patient verbalized understanding.   HOME EXERCISE PROGRAM: No changes at this time  Access Code: [facility] URL: [email or web address] Date: 11/23/2021 Prepared by: Marina  Moser Exercises - Seated Gluteal Sets - 2 x daily - 7 x weekly - 2 sets - 10 reps - 5 hold - Seated Quad Set - 2 x daily - 7 x weekly - 2 sets - 10 reps - 5 hold - Seated Long Arc Quad - 2 x daily - 7 x weekly - 2 sets - 10 reps - 5 hold - Seated March - 2 x daily - 7 x weekly - 2 sets - 10 reps - 5 hold - Seated Hip Abduction - 2 x daily - 7 x weekly  - 2 sets - 10 reps - 5 hold - Seated Shoulder Shrugs - 2 x daily - 7 x weekly - 2 sets - 10 reps - 5 hold - Wheelchair Pressure Relief - 2 x daily - 7 x weekly - 2 sets - 10 reps - 5 hold  ASSESSMENT:   GOALS: Goals reviewed with patient? Yes  SHORT TERM GOALS: Target date: 02/22/2022  Pt will be independent with initial UE strengthening HEP in order to improve strength and balance in order to decrease fall risk and improve function at home and work. Baseline: 10/19/2021- patient with no formal UE HEP; 12/02/2021= Patient verbalized knowledge of HEP including use of theraband for UE strengthening.  Goal status: GOAL MET  LONG TERM GOALS: Target date: 09/12/2023   Pt will be independent with final for UE/LE HEP in order to improve strength and balance in order to decrease fall risk and  improve function at home and work. Baseline: Patient is currently BLE NWB and unable to participate in HEP. Has order for UE strengthening. 01/11/2022- Patient now able to participate in LE strengthening although NWB still- good understanding for some basic exercises. Will keep goal active to incorporate progressive LE strengthening exercises. 04/07/2022= Patient still participating in progressive LE seated HEP and has no questions at this time.  Goal status: MET  2.  Pt will improve FOTO to target score of 35  to display perceived improvements in ability to complete ADL's.  Baseline: 10/19/2021= 12; 12/02/2021= 17; 01/11/2022=17; 04/07/2022= 17; 07/05/2022=17; 10/06/2022- outcome measure not appropriate as patient in non-ambulatory and not consistently standing. Goal status: Goal not appropriate  3.  Pt will increase strength of B UE  by at least 1/2 MMT grade in order to demonstrate improvement in strength and function  Baseline: patient has range of 2-/5 to 4/5 BUE Strength; 12/02/2021= 4/5 except with wrist ext. 01/11/2022= 4/5 B UE strength expept for wrist Goal status: Goal revised-no longer appropriate- working with OT  on all UE strengthening.   4. Pt. Will increase strength of RLE by at least 1/2 MMT grade in order to demonstrate improvement in Standing/transfers.  Baseline: 2-/5 Right hip flex/knee ext/flex; 04/07/2022= 2- with right hip flex/knee ext (lacking 28 deg from zero) 4/8= Patient able to ext right knee lacking 18 deg from zero. 07/05/2022=Left knee approx 8 deg from zero and right knee = 23 deg from zero; 12/29/2022 - Left LE = full ROM 4-/5 and right knee ext= 23 deg from zero= 2+/5 (gravity minimized); 03/28/2023= Left knee ext=  4/5 and right knee ext= 3-/5 (able to move lower leg through incomplete ROM -lacking 20 deg from full ROM against gravity); 05/23/2023= right knee ext= 3-/5 (able to move lower leg through incomplete ROM -lacking 17 deg from full ROM against gravity); 06/20/2023= Right knee ext= lacking 20 deg from zero  Goal status: Progressing  5. Pt. Will demo ability to stand pivot transfer with max assist for improved functional mobility and less dependent need on mechanical device.   Baseline: Dependent on hoyer lift for all transfers; 04/07/2022- Unable to test secondary to patient with recent UTI and right hand procedure - will attempt to reassess next visit. ; 04/12/22: Unable to successfully stand due to feet plates on loaned power w/c; 05/10/2022- Patient still having to use rental chair and has not received her original power w/c back to practice SPT. 07/04/2021= Patient just received new power w/c and attempted standing from 1st time today- Patient able to stand with max assist today from w/c and did not attempt to perform pivot transfer- difficulty with static standing and placing weight on right LE. 10/06/2022- Not assessed today- patient needs new AFO prior to attempting transfers; 12/29/2022= Patient has not attempted SPT yet - still trying to improve LE strength enough to stand well without knee buckling with weight shifting. 03/28/2023- not attempted today due to ill fitting brace but patient last  performed SPT on 01/10/23 and was able to perform with Max A with feet pre-positioned to angle toward direction of transfer; 05/23/2023- Not tested today but on last visit- patient able to perform SPT with max assist yet concern for twisting knee as she was unable to turn/pivot foot .  06/20/2023- deferred to next session due to patient fatigued with static standing today.  Goal status: ONGOING  6. Pt. Will demonstrate improved functional LE strength as seen by ability to stand > 2 min  for improved transfer ability and pregait abilities.   Baseline: not assessed today due to recent dx: UTI; 04/07/2022- Unable to test secondary to patient with recent UTI and right hand procedure - will attempt to reassess next visit; 04/12/22: Unable to successfully stand due to feet plates on loaned power w/c 05/10/2022- Patient still having to use rental chair and has not received her original power w/c back to practice Standing. 07/05/2022- Patient able to stand with max assist at trunk today for 2 min 3 sec. Will keep goal active to ensure consistency. 10/06/2022= Unable to assess today- patient with some recent blood pressure issues and needs new AFO. 11/24/2022= Patient demonstrated multiple rounds of standing in // bars- holding up to 1:45 min today but has been able to exhibit 2 min in recent past- working on consistency. 12/29/2022= 2 min 40 sec with patient demonstrating improved overall posture and ability to contract glutes and quads with just min assist at trunk today.   Goal status: MET  7. Pt. Will demonstrate improved functional LE strength as seen by ability to stand > 5 min for improved transfer ability and pregait abilities. Baseline: 12/29/2022= 2 min 40 sec with patient demonstrating improved overall posture and ability to contract glutes and quads with just min assist at trunk today. 03/28/2023= Attempted 2 trials today - at best 2 min 48 sec today but during cert- did stand for 3 min 44 sec. 06/20/2023= Patient able  to stand 2x (45 sec and 1 min 12 sec)- max assist yet no slippage of LLE.  Goal Status: Ongoing- Patient has only been standing 2x since received new AFO shoe.  8. Patient will perform sit to stand transfer with moderate assist consistently > 75% of time for improved transfer ability and less dependence on caregiver.   Baseline: 12/29/2022- Patient currently max assist level assistance with all sit to stand activities. 03/28/2023= Patient is still majority of max assist to stand yet able to pull up from wheelchair smoother in one motion. 06/20/2023- Max Assist but has only most recently resumed standing since last week so goal still appropriate for new cert  Goal status: ONGOING   ASSESSMENT:  CLINICAL IMPRESSION:   Patient was able to successfully stand again today for recert visit-  no pain yet very limited compared to previous episodes of standing. This previous certification was focused on LE Strengthening while waiting on new brace to be made. Now brace is ready and patient was able to resume standing today and continues to improve although very fatiguing. No slippage with new brace today and patient was very pleased to be up standing if only for a short duration today.  Treatment will focus now on more standing and transfers. Patient's condition has the potential to improve in response to therapy. Maximum improvement is yet to be obtained. The anticipated improvement is attainable and reasonable in a generally predictable time.  Pt will continue to benefit from skilled PT services to optimize independence and reduced caregiver support.   OBJECTIVE IMPAIRMENTS Abnormal gait, decreased activity tolerance, decreased balance, decreased coordination, decreased endurance, decreased mobility, difficulty walking, decreased ROM, decreased strength, hypomobility, impaired flexibility, impaired UE functional use, postural dysfunction, and pain.   ACTIVITY LIMITATIONS carrying, lifting, bending, sitting,  standing, squatting, sleeping, stairs, transfers, bed mobility, continence, bathing, toileting, dressing, self feeding, reach over head, hygiene/grooming, and caring for others  PARTICIPATION LIMITATIONS: meal prep, cleaning, laundry, medication management, personal finances, interpersonal relationship, driving, shopping, community activity, and yard work  PERSONAL FACTORS  Age, Time since onset of injury/illness/exacerbation, and 1-2 comorbidities: HTN, cervical Sx are also affecting patient's functional outcome.   REHAB POTENTIAL: Good  CLINICAL DECISION MAKING: Evolving/moderate complexity  EVALUATION COMPLEXITY: Moderate  PLAN: PT FREQUENCY: 1-2x/week  PT DURATION: 12 weeks  PLANNED INTERVENTIONS: Therapeutic exercises, Therapeutic activity, Neuromuscular re-education, Balance training, Gait training, Patient/Family education, Self Care, Joint mobilization, DME instructions, Dry Needling, Electrical stimulation, Wheelchair mobility training, Spinal mobilization, Cryotherapy, Moist heat, and Manual therapy  PLAN FOR NEXT SESSION:   Continue with LE and core strength training  Sit to stand and static standing SPT as able.     2:04 PM, 06/20/23  2:04 PM, 06/20/23 Ossie Blend, PT Physical Therapist - [facility]  Outpatient Physical Therapy- Main [facility] [phone]

## 2023-06-20 NOTE — Therapy (Signed)
 Occupational Therapy  Neuro Treatment Note  Patient Name: Sherry [name] MRN: [id number] DOB:1934-05-16, 89 y.o., female Today's Date: 11/29/2022  PCP:  Waunita Haff PROVIDER: Valrie Gehrig, MD  END OF SESSION:   OT End of Session - 06/20/23 1242     Visit Number 125    Number of Visits 192    Date for OT Re-Evaluation 08/17/23    OT Start Time 1105    OT Stop Time 1145    OT Time Calculation (min) 40 min    Activity Tolerance Patient tolerated treatment well    Behavior During Therapy WFL for tasks assessed/performed                         Past Medical History:  Diagnosis Date   Acute blood loss anemia    Arthritis    Cancer (HCC)    skin   Central cord syndrome at C6 level of cervical spinal cord (HCC) 11/29/2017   Hypertension    Protein-calorie malnutrition, severe (HCC) 01/24/2018   S/P insertion of IVC (inferior vena caval) filter 01/24/2018   Tetraparesis (HCC)    Past Surgical History:  Procedure Laterality Date   ANTERIOR CERVICAL DECOMP/DISCECTOMY FUSION N/A 11/29/2017   Procedure: Cervical five-six, six-seven Anterior Cervical Decompression Fusion;  Surgeon: Cannon Champion, MD;  Location: [facility] OR;  Service: Neurosurgery;  Laterality: N/A;  Cervical five-six, six-seven Anterior Cervical Decompression Fusion   CATARACT EXTRACTION     FEMUR IM NAIL Left 08/16/2021   Procedure: INTRAMEDULLARY (IM) NAIL FEMORAL;  Surgeon: Marlynn Singer, MD;  Location: [facility] ORS;  Service: Orthopedics;  Laterality: Left;   IR IVC FILTER PLMT / S&I /IMG GUID/MOD SED  12/08/2017   Patient Active Problem List   Diagnosis Date Noted   Age-related osteoporosis without current pathological fracture 07/27/2022   Mild recurrent major depression (HCC) 07/27/2022   Hypertensive heart disease with other congestive heart failure (HCC) 02/14/2022   Chronic indwelling Foley catheter 02/14/2022   Closed hip fracture (HCC) 08/15/2021   DVT (deep venous thrombosis) (HCC)  08/15/2021   Quadriplegia (HCC) 08/15/2021   Fall 08/15/2021   Constipation due to slow transit 08/31/2018   Trauma 06/05/2018   Neuropathic pain 06/05/2018   Neurogenic bowel 06/05/2018   Vaginal yeast infection 01/30/2018   Healthcare-associated pneumonia 01/25/2018   Chronic allergic rhinitis 01/24/2018   Depression with anxiety 01/24/2018   UTI due to Klebsiella species 01/24/2018   Protein-calorie malnutrition, severe (HCC) 01/24/2018   S/P insertion of IVC (inferior vena caval) filter 01/24/2018   Tetraparesis (HCC) 01/20/2018   Neurogenic bladder 01/20/2018   Reactive depression    Benign essential HTN    Acute postoperative anemia due to expected blood loss    Central cord syndrome at C6 level of cervical spinal cord (HCC) 11/29/2017   Allergy to alpha-gal 11/25/2016   SCC (squamous cell carcinoma) 04/08/2014   ONSET DATE: 11/29/2017  REFERRING DIAG: Central Cord Syndrome at C6 of the Cervical Spinal Cord, Fall with Bilateral Closed Hip Fractures, with ORIF repair with Intramedullary Nailing of the Left Hip Fracture.   THERAPY DIAG:  Muscle weakness (generalized)  Other lack of coordination  Rationale for Evaluation and Treatment: Rehabilitation  SUBJECTIVE:  SUBJECTIVE STATEMENT:  Pt. reports having had a nice Mother's Day lunch on Monday with her family.  Pt accompanied by: self, Personal Care aide  PERTINENT HISTORY: Central Cord Syndrome at C6 of the Cervical Spinal Cord, Fall with Bilateral Closed Hip Fractures, with  ORIF repair with Intramedullary Nailing of the Left Hip Fracture.   PRECAUTIONS: None  WEIGHT BEARING RESTRICTIONS: No  PAIN:  Are you having pain? No pain, just shoulder stiffness   LIVING ENVIRONMENT: Lives with: [facility] Stairs: No Has following equipment at home: Wheelchair (power)  PLOF: Independent   PATIENT GOALS:  See below for established goals  OBJECTIVE:  Note: Objective measures were completed at  Evaluation unless otherwise noted.  HAND DOMINANCE: Right  ADLs: Caregiver assist with ADLs, and [facility]  MOBILITY STATUS: Uses a power w/c  ACTIVITY TOLERANCE: Activity tolerance: Fair  FUNCTIONAL OUTCOME MEASURES:  Measurements:   12/09/2021:   Shoulder flexion: R: 120(136), L: 138(150) Shoulder abduction: R: 108(120), L: 110(120) Elbow: R: 0-148, L: 0-146 Wrist flexion: R: 55(62), L: 78 Writ extension: R: 34(55), L: 3(4) RD: R: 12(20), L: 16(26) UD: R: 8(24), L: 6(18) Thumb radial abduction: R: 11(20), L: 8(20) Digit flexion to the [facility]: R:  2nd: 5cm(3.5cm), 3rd: 7.5 cm(5cm), 4th: 7cm(3cm), 5th: 6cm(5.5cm) L: 2nd: 8cm(8cm), 3rd: 8cm(8cm), 4th: 7.5cm(7cm), 5th: 7cm(7cm)   02/03/2022:   Shoulder flexion: R: 122(138), L: 148(154) Shoulder abduction: R: 109(125), L: 125(125 Elbow: R: 0-148, L: 0-146 Wrist flexion: R: 60(68), L: 79 Writ extension: R: 40(55), L: 3(10) RD: R: 14(24), L: 20(28) UD: R: 8(24), L: 6(18) Thumb radial abduction: R: 20(25), L: 8(20) Digit flexion to the [facility]: R:  2nd: 4.5cm(3cm), 3rd: 6.5cm(4.5cm), 4th: 6.5 cm(3cm), 5th: 4.5cm(5cm) L: 2nd: 8cm(8cm), 3rd: 8cm(7cm), 4th: 7.5cm(7cm), 5th: 6.5cm(6.5cm)   04/07/2022:   Shoulder flexion: R: 125(150), L: 160(160) Shoulder abduction: R: 109(125), L: 125(125 Elbow: R: 0-148, L: 0-146 Wrist flexion: R: 60(68), L: 79 Writ extension: R: 40(55), L: 5(18) RD: R: 14(24), L: 20(28) UD: R: 18(24), L: 8(18) Thumb radial abduction: R: 20(25), L: 8(20) Digit flexion to the [facility]: R:  2nd: 4.5 cm(4 cm), 3rd: 6.5cm(3cm), 4th: 7 cm (5cm), 5th: 5cm(5cm) L: 2nd: 8cm(8cm), 3rd: 8cm(7cm), 4th: 7.5cm(7cm), 5th: 7cm(7cm)   07/05/2022:   Shoulder flexion: R: 125(154), L: 160(160) Shoulder abduction: R: 95(130), L: 143(143) Elbow: R: 0-148, L: 0-146 Wrist flexion: R: 60(68), L: 79 Writ extension: R: 40(60), L: 0(12) RD: R: 10(24), L: 12(20) UD: R: 16(24), L: 8(16) Thumb radial abduction: R: 20(25), L:  8(20) Digit flexion to the [facility]: R:  2nd: 4 cm(4cm), 3rd: 6.5cm( 3.5cm), 4th: 7 cm (5cm), 5th: 6cm(5cm) L: 2nd: 8cm(8cm), 3rd: 8cm(7cm), 4th: 8cm(7cm), 5th: 7cm(7cm)   09/13/2022:   Shoulder flexion: R: 125(154), L: 160(160) Shoulder abduction: R: 100(130), L: 143(143) Elbow: R: 0-150, L: 0-150 Wrist flexion: R: 55(68), L: 79 Writ extension: R: 45(60), L: 0(12) RD: R: 10(24), L: 12(20) UD: R: 16(24), L: 8(16) Thumb radial abduction: R: 20(25), L: 8(20) Digit flexion to the [facility]: R:  2nd: 4 cm(4cm), 3rd: 6cm( 5cm), 4th: 6 cm (4cm), 5th: 5cm(5cm) L: 2nd: 7cm(cm), 3rd: 7cm(6cm), 4th: 7cm(6cm), 5th: 6cm(6cm)   10/27/2022   Shoulder flexion: R: 128(154), L: 160(160) Shoulder abduction: R: 105(130), L: 143(143) Elbow: R: 0-150, L: 0-150 Wrist flexion: R: 55(68), L: 79 Writ extension: R: 46(64), L: 0(20) RD: R: 10(20), L: 18(24) UD: R: 18(28), L: 8(20) Thumb radial abduction: R: 30(42), L:20(24) Digit flexion to the [facility]: R:  2nd: 4 cm(3cm), 3rd: 6cm( 6cm), 4th: 6 cm (5cm), 5th: 5cm(5cm) L: 2nd: 8cm(8cm), 3rd: 8cm(6cm), 4th: 8cm(7cm), 5th: 6cm(6cm)  01/19/2023  Shoulder flexion: R: 131(154), L: 160(160) Shoulder abduction: R: 110(130), L: 143(143) Elbow: R: 0-150, L: 0-150 Wrist  flexion: R: 55(68), L: 79 Writ extension: R: 46(64), L: 0(20) RD: R: 10(20), L: 18(24) UD: R: 18(28), L: 14(20) Thumb radial abduction: R: 38(48), L:24(26) Thumb IP flexion: R: 35(50) L: 20(45) Digit flexion to the [facility]: R:  2nd: 3.5 cm(3cm), 3rd: 6cm( 6cm), 4th: 6 cm (5cm), 5th: 5cm(4.5cm) L: 2nd: 8cm(7.5cm), 3rd: 8cm(6cm), 4th: 7.5cm(6cm), 5th: 6cm(4.5cm)  04/04/2023  Shoulder flexion: R: 134(154), L: 160(160) Shoulder abduction: R: 114(130), L: 143(143) Elbow: R: WNL, L: WNL Wrist flexion: R: 55(68), L: 80 Writ extension: R: 46(64), L: -10(10) RD: R: 14(24), L: 20(24) UD: R: 20(30), L: 14(20) Thumb radial abduction: R: 38(48), L:28(30) Thumb IP flexion: R: 40(50) L: 25(45) Digit flexion to the  [facility]: R:  2nd: 3.5 cm(3cm), 3rd: 6cm( 5cm), 4th: 6 cm (4cm), 5th: 5cm(5cm) L: 2nd: 8cm(7.5cm), 3rd: 8cm(7cm), 4th: 7.5cm(7cm), 5th: 7cm(6cm)  05/25/2023  Shoulder flexion: R: 134(154), L: 160(160) Shoulder abduction: R: 117(134), L: 143(143) Elbow: R: WNL, L: WNL Wrist flexion: R: 55(68), L: 80 Wrist extension: R: 46(64), L: -8(14) RD: R: 14(24), L: 20(24) UD: R: 20(32), L: 14(22) Thumb radial abduction: R: 38(50), L:28(30) Thumb IP flexion: R: 40(50) L: 30(45) Digit flexion to the [facility]: R:  2nd: 3.5 cm(3cm), 3rd: 6cm( 5cm), 4th: 7 cm (4cm), 5th: 5cm(4cm) L: 2nd: 8cm(7.5cm), 3rd: 8cm(7cm), 4th: 7.5cm(7cm), 5th: 7cm(6cm)  COORDINATION:   01/19/2023:  9 hole peg test:  Right: 2 pegs placed in 1 min. & 5 sec.  Pt. was able to remove 9 pegs in 17 sec.  Left: 2 pegs placed in 1 min. & 10 sec. Pt. was able to remove 9 pegs in 20 sec.  04/04/23:  Pt. was able to consistently grasp the pegs however each one slipped out of her fingers when attempting to place them into the Pegboard.  05/25/2023:   Right: 9 pegs placed and removed in 2 min. & 41 sec. Left: Unable to grasp the pegs for a horizontal position.  SENSATION: Intact  EDEMA: N/A   COGNITION: Overall cognitive status: WNL   VISION: Subjective report: Wears glasses, No changes in vision.  PERCEPTION: Intact  TODAY'S TREATMENT:                                                                                                                              DATE:  06/20/23  Moist heat for Bilateral shoulder shoulders, and bilateral hands for muscle relaxation used simultaneous to passive stretching noted below  Therapeutic Ex.:  -Pt. tolerated PROM followed by AROM bilateral wrist extension, PROM for bilateral digit MP, PIP, and DIP flexion, and extension, thumb abduction.  -Lateral, and 3pt. Pinch strengthening using yellow level resistive clip to prepare the hand for using a reacher.  Therapeutic  Activities:  -Facilitated RUE  functional reaching out into multiple planes combined with FMC grasping for 1" resistive cubes alternating thumb opposition to the tip of the 2nd/3rd digits while the board is placed at a vertical angle to  encourage wrist extension. Emphasis was placed on islolating 2nd digit extension to press them into place when pressing them back into place.        PATIENT EDUCATION: Education details: Bilateral hand and shoulder ROM Person educated: Patient Education method: Explanation, Demonstration, and Verbal cues Education comprehension: verbalized understanding and returned demonstration  HOME EXERCISE PROGRAM: Continue to assess HEP needs, and provide as needed.   GOALS: Goals reviewed with patient? Yes  LONG TERM GOALS: Target date: 08/17/2023  3.  Patient will independently use a reacher to pick up items from various surfaces.  Baseline: 05/25/2023:  Pt. is now able to initiate grasping a reacher to open and close the reacher with the right hand, however requires the grasp handle to be repositioned  within the hand. Pt. requires work on using/moving the reacher with the right hand through various planes. 04/04/2023: Pt. continues to work on improving Digit flexion to the [facility] to be able to efficiently handle Adaptive equipment. 01/19/2023: Pt. Is improving with Bilateral digit flexion to the [facility]. Pt. has difficulty firmly holding adaptive devices. 12/06/2022: Pt. continues to present with increased MP, PIP, and DIP digit tightness/stiffness limiting the formulation of bilateral composites fists. 10/27/2022: Pt. presents with increased MP, PIP, and DIP digit tightness/stiffness limiting the formulation of bilateral composites fists during this progress reporting period limiting. Digit flexion to the [facility]: R:  2nd: 4 cm(3cm), 3rd: 6cm( 6cm), 4th: 6 cm (5cm), 5th: 5cm(5cm), L: 2nd: 8cm(8cm), 3rd: 8cm(6cm), 4th: 8cm(7cm), 5th: 6cm(6cm) 09/20/2022: Improving digit flexion to the  [facility]. 09/13/22: improved digit flexion to the [facility]. 07/05/2022: Pt. presents with digit MP, PIP, and DIP extensor tightness limiting her ability to securely grip objects in her bilateral hands.  04/07/2022: Pt. Has improved with right 2nd, and 3rd digit flexion towards the [facility]. Pt. Continues to have difficulty securely holding and applying deodorant.02/03/2022: Pt. Has improved with digit flexion, however, continues to have difficulty securely holding and using deodorant. 12/09/2021: Bilateral hand/digit MP, PIP, and DIP extension tightness limits her ability achieve digit flexion to hold, and apply deodorant. 10/28/2021:  Pt. continues to have difficulty holding the deodorant. 01/12/2021: Pt. Presents with limited digit extension. Pt. Is able to initiate holding deodorant, however is unable to hold it while using it  Goal status: Ongoing   4.  Pt. Will improve bilateral wrist extension in preparation for anticipating, and initiating reaching for objects at the table.  Baseline: 05/25/23: Wrist extension: R: 46(64), L: -8(14) 04/04/23: R: 46(64) L: -10(10)01/19/2023:  R: 46(64) L: 0(20) 12/06/2022: Pt continues to to progress with bilateral wrist extension in preparation fro functional reaching. 10/27/2022: Pt. Is improving with bilateral wrist extension. R: 46(64) L: 0(20) 09/20/2022: limited PROM in the Left wrist extension 2/2 tightness/stiffness. 09/13/22: R: 55(68), L: 0(12) 07/19/2022: Bilateral wrist extension in limited. 07/05/2022: Right: 40(60) Left: 0(12) 04/07/2022: Right: 40(55) Left: 5(18) 02/03/2022: Right: 34(55) Left: 3(4) 12/09/2021: Right  34(55)  Left  3(4) 10/28/2021:  Right 22(38), Left 0(15) 09/14/2021:  Right: 17(35), left 2(15)  Goal status: Progressing/Ongoing  7.  Pt. Will increase bilateral lateral pinch strength by 2 lbs to be able to securely grasp items during ADLs, and IADL tasks.  Baseline: 01/19/2023: Deferred as the Pinch meter is out for calibration.12/06/2022: Pt. Is able to more securely  hold objects during ADLs/IADLs 10/27/2022: Pt. Is improving with holding items with her bilateral thumbs. (Pinch meter out for calibration) 09/20/2022: lateral pinch continues to be limited 09/13/22: R 6.5# L 4# 07/19/2022: TBD 07/05/2022:  TBD 07/2022: NT-Pinch meter out for calibration. 02/03/2022: Right: 6#, Left: 4# 12/09/2021: Right: 6#, Left: 4#  Goal status: Deferred   9.  Pt. will complete plant care with modified independence.  Baseline:12/06/2022: Pt. Is independent with plant care in her husband's room, however is not able to access her plants due to furniture blocking access to them. 10/27/2022: Pt. Is able to water plants that are closer, and within reach. Pt. continues to have difficulty reaching for thorough plant care. 09/20/2022: Pt. Continues to have difficulty reaching for thorough plant care. 09/13/22: continues to report intermittent difficulty 07/19/2022: Pt. Continues to be able to water, and care for some of her plants. Pt. Has more difficulty with plants that are harder to reach. 07/05/2022: pt. Is able to water, and care for some of her plants. Pt. Has more difficulty with plants that are harder to reach. 04/07/2022: Pt. is now able to hold a cup and water her plants.02/03/2022: Pt. has difficulty caring for her plants.  Goal status:Achieved   10.  Pt. will demonstrate adaptive techniques to assist with the efficiency of self-dressing, or morning care tasks.  Baseline: 05/25/2023: Pt. continues to work on improving UE functioning to be able to efficiently use Adaptive equipment.  04/04/2023:  Pt. continues to work on improving UE functioning to be able to efficiently use Adaptive equipment. 01/19/2203: Pt. Is able to donn her jacket in reverse independently. Pt. requires assist from staff, as Pt. Reports staff have decreased time. 12/06/2022: Pt. requires assist from staff, as Pt. Reports staff have decreased time. 10/27/2022: Pt. is able to assist with initiating UE dressing. Pt. requires assist  from personal, and staff care aides. 09/20/2022: Pt. continues to require assist form personal, and staff care aides. 09/13/22: MODA dressing, reports she is often rushed 07/19/2022: Pt. continues to require assist with self-dressing/morning care tasks. 07/05/2022: Continue with goal. 04/07/2022: Pt. Continues to require assist with the efficiency of self-dressing, and morning care tasks.02/03/2022: Pt. requires assist from caregivers 2/2 time limitations during morning care.  Goal status: Ongoing  11.  Pt. will improve BUE strength to improve ADL, and IADL functioning.  Baseline: 05/25/2023: shoulder flexion L 4/5, R 4-/5; shoulder abduction L 4+/5, R 4-/5. elbow flexion B 5/5, elbow extension 4+/5 01/19/2023: L 4/5, R 3+/5  04/04/2023: shoulder flexion L 4/5, R 3+/5; shoulder abduction L 4+/5, R 3+/5. elbow flexion B 5/5, elbow extension 4/5 01/19/2023: L 4/5, R 3+/5 12/06/2022: Continue 10/27/2022: shoulder flexion L 4/5, R 3+/5; shoulder abduction L 4+/5, R 3+/5. elbow flexion B 5/5, elbow extension 4/5 09/20/2022: Pt. Presents with limited BUE strength 09/13/22: shoulder flexion L 4/5, R 3/5; elbow flexion B 5/5, elbow extension 4-/5; shoulder abduction L 4+/5, R 3+/5. 07/19/2022: BUE strength continues to be limited. 07/05/2022: shoulder flexion: right 4-/5, abduction: 3+/5, elbow flexion: right: 5/5, left 5/5, extension: right: 4-/5, left 4-/5, wrist extension: right: 3-/5, left: 2-/5  Goal status: Ongoing  12.  Pt. will improve bilateral thumb radial abduction in order to be able to hold the grab bars while standing with PT  Baseline: 05/25/23: Thumb radial abduction: R: 38(50), L:28(30) 04/04/23: R: 38(48), L:28(30) 01/19/2023: R: 38(48) L: 24(26)12/06/2022: Pt. Continues to present  with limited thumb abduction, however is improving holding onto the parallel bars.10/27/2022: thumb radial abduction: R: 30(42), L: 20(24  Goal status: Ongoing  13.  Pt. Will be able to securely hold, and stabilize medication  bottles at a tabletop surface when opening, and closing them.  Baseline: 01/19/2023: Pt.  Is now able to securely hold medication bottles while opening them, and stabilizes them on surfaces while opening them. 12/06/2022: Pt is improving with securely stabilizing medication bottles. 10/27/2022: Pt. Stabilizes bottles against her torso when attempting to open, and close them   Goal status: Achieved  14: Pt. Will improve bilateral thumb IP flexion to improve active grasping patterns.    Baseline: 05/25/2023: Thumb IP flexion: R: 40(50) L: 30(45) 04/04/2023:  R: 40(50) L: 25(45) 01/19/2023: Right 35(50), Left: 20/45)    Goal status: Ongoing    15. Pt. Will improve bilateral FMC/speed, and dexterity. As evidence by improved scores on the 9 hole peg test.  Baseline: 05/25/2023: 9 Hole pegs test speed/dexterity: Right: 9 pegs placed and removed in 2 min. & 41 sec. Left: Unable to grasp the pegs for a horizontal position. 04/04/23: Pt. was able to consistently grasp the pegs however each one slipped out of her fingers when attempting to place them into the Pegboard. 01/19/2023: 2 pegs placed in 1 min. & 5 sec.  Pt. was able to remove 9 pegs in 17 sec.; Left: 2 pegs placed in 1 min. & 10 sec. Pt. was able to remove 9 pegs in 20 sec.   Goal status: Ongoing  ASSESSMENT:  CLINICAL IMPRESSION:  Pt. continues to tolerate ROM to the bilateral hand digits well today. Pt. Was able to consistently grasp the resistive cubes. Pt. was able to maintain her grasp on the cubes while reaching up to place, and press the cubes into place using isolated 2nd digit extension. Pt. Continues to present with bilateral digit MP, PIP, and DIP extensor tightness with increased left wrist flexor tightness, and digit MP, PIP, and DIP tightness.Pt. continues to benefit from OT services to work on impoving ROM, and UE strength in order to work towards increasing bilateral hand grasp on objects, and increasing engagement of the bilateral hands  during ADLs, and IADL tasks.     PERFORMANCE DE PERFORMANCE DEFICITS: in functional skills including ADLs, IADLs, coordination, dexterity, ROM, strength, and UE functional use, cognitive skills including , and psychosocial skills including coping strategies and environmental adaptation.   IMPAIRMENTS: are limiting patient from ADLs, IADLs, and leisure.   CO-MORBIDITIES: may have co-morbidities  that affects occupational performance. Patient will benefit from skilled OT to address above impairments and improve overall function.  MODIFICATION OR ASSISTANCE TO COMPLETE EVALUATION: Min-Moderate modification of tasks or assist with assess necessary to complete an evaluation.  OT OCCUPATIONAL PROFILE AND HISTORY: Detailed assessment: Review of records and additional review of physical, cognitive, psychosocial history related to current functional performance.  CLINICAL DECISION MAKING: Moderate - several treatment options, min-mod task modification necessary  REHAB POTENTIAL: Good for stated goals  PLAN:  OT FREQUENCY 2x's a week  OT DURATION: 12 weeks  PLANNED INTERVENTIONS ADL training, A/E training, UE ther. Ex, Manual therapy, neuromuscular re-education, moist heat modality, Paraffin Bath, Splinting, and  Pt./caregiver education   RECOMMENDED OTHER SERVICES: PT  CONSULTED AND AGREED WITH PLAN OF CARE: Patient  PLAN FOR NEXT SESSION: Continue Treatment as per established POC  Duey Ghent, MS, OTR/L   06/20/23, 12:47 PM

## 2023-06-22 ENCOUNTER — Ambulatory Visit: Payer: Medicare HMO | Admitting: Occupational Therapy

## 2023-06-22 ENCOUNTER — Ambulatory Visit: Payer: Medicare HMO

## 2023-06-22 DIAGNOSIS — M6281 Muscle weakness (generalized): Secondary | ICD-10-CM | POA: Diagnosis not present

## 2023-06-22 DIAGNOSIS — R2681 Unsteadiness on feet: Secondary | ICD-10-CM

## 2023-06-22 DIAGNOSIS — S14129S Central cord syndrome at unspecified level of cervical spinal cord, sequela: Secondary | ICD-10-CM

## 2023-06-22 DIAGNOSIS — S72001A Fracture of unspecified part of neck of right femur, initial encounter for closed fracture: Secondary | ICD-10-CM

## 2023-06-22 DIAGNOSIS — R2689 Other abnormalities of gait and mobility: Secondary | ICD-10-CM

## 2023-06-22 DIAGNOSIS — R278 Other lack of coordination: Secondary | ICD-10-CM

## 2023-06-22 DIAGNOSIS — R262 Difficulty in walking, not elsewhere classified: Secondary | ICD-10-CM

## 2023-06-22 DIAGNOSIS — R269 Unspecified abnormalities of gait and mobility: Secondary | ICD-10-CM

## 2023-06-22 NOTE — Therapy (Signed)
 OUTPATIENT PHYSICAL THERAPY NEURO TREATMENT   Patient Name: Sherry [name] MRN: [id number] DOB:23-Mar-1934, 89 y.o., female Today's Date: 06/22/2023   PCP: Valrie Gehrig, MD REFERRING PROVIDER: Marlynn Singer   PT End of Session - 06/22/23 1305     Visit Number 116    Number of Visits 139    Date for PT Re-Evaluation 09/12/23    Progress Note Due on Visit 120    PT Start Time 1145    PT Stop Time 1226    PT Time Calculation (min) 41 min    Equipment Utilized During Treatment Gait belt    Activity Tolerance Patient tolerated treatment well;No increased pain    Behavior During Therapy WFL for tasks assessed/performed                         Past Medical History:  Diagnosis Date   Acute blood loss anemia    Arthritis    Cancer (HCC)    skin   Central cord syndrome at C6 level of cervical spinal cord (HCC) 11/29/2017   Hypertension    Protein-calorie malnutrition, severe (HCC) 01/24/2018   S/P insertion of IVC (inferior vena caval) filter 01/24/2018   Tetraparesis (HCC)    Past Surgical History:  Procedure Laterality Date   ANTERIOR CERVICAL DECOMP/DISCECTOMY FUSION N/A 11/29/2017   Procedure: Cervical five-six, six-seven Anterior Cervical Decompression Fusion;  Surgeon: Cannon Champion, MD;  Location: [facility] OR;  Service: Neurosurgery;  Laterality: N/A;  Cervical five-six, six-seven Anterior Cervical Decompression Fusion   CATARACT EXTRACTION     FEMUR IM NAIL Left 08/16/2021   Procedure: INTRAMEDULLARY (IM) NAIL FEMORAL;  Surgeon: Marlynn Singer, MD;  Location: [facility] ORS;  Service: Orthopedics;  Laterality: Left;   IR IVC FILTER PLMT / S&I /IMG GUID/MOD SED  12/08/2017   Patient Active Problem List   Diagnosis Date Noted   Age-related osteoporosis without current pathological fracture 07/27/2022   Mild recurrent major depression (HCC) 07/27/2022   Hypertensive heart disease with other congestive heart failure (HCC) 02/14/2022   Chronic indwelling  Foley catheter 02/14/2022   Closed hip fracture (HCC) 08/15/2021   DVT (deep venous thrombosis) (HCC) 08/15/2021   Quadriplegia (HCC) 08/15/2021   Fall 08/15/2021   Constipation due to slow transit 08/31/2018   Trauma 06/05/2018   Neuropathic pain 06/05/2018   Neurogenic bowel 06/05/2018   Vaginal yeast infection 01/30/2018   Healthcare-associated pneumonia 01/25/2018   Chronic allergic rhinitis 01/24/2018   Depression with anxiety 01/24/2018   UTI due to Klebsiella species 01/24/2018   Protein-calorie malnutrition, severe (HCC) 01/24/2018   S/P insertion of IVC (inferior vena caval) filter 01/24/2018   Tetraparesis (HCC) 01/20/2018   Neurogenic bladder 01/20/2018   Reactive depression    Benign essential HTN    Acute postoperative anemia due to expected blood loss    Central cord syndrome at C6 level of cervical spinal cord (HCC) 11/29/2017   Allergy to alpha-gal 11/25/2016   SCC (squamous cell carcinoma) 04/08/2014    ONSET DATE: 08/15/2021 (fall with B hip fx); Initial injury was in 2019- quadraplegia due to central cord syndrome of C6 secondary to neck fracture.   REFERRING DIAG: Bilateral Hip fx; Left Open- s/p Left IM nail femoral on 08/16/2021; Right closed  THERAPY DIAG:  Muscle weakness (generalized)  Other lack of coordination  Central cord syndrome, sequela (HCC)  Other abnormalities of gait and mobility  Difficulty in walking, not elsewhere classified  Abnormality of gait and mobility  Unsteadiness on feet  Closed fracture of both hips, initial encounter [facility])  Rationale for Evaluation and Treatment Rehabilitation  SUBJECTIVE:   SUBJECTIVE STATEMENT:    Patient reports doing well without any particular issues this morning.         Pt accompanied by: Paid caregiver-   PERTINENT HISTORY: Sherry [name] is an 87yoF who experienced a fall at home on 08/15/2021 with hip fracture, s/p Left hip ORIF. PMH: quadriplegia due to central cord syndrome of C6  secondary to neck fracture, HTN, depression with anxiety, neurogenic bladder, bilateral DVT on Xarelto  (s/p of IVC filter placement). Prior history of significant PT/OT with improvement of function. Pt has full use of shoulder/elbows, limited use of hands, adaptive self feeding sucessful.     PAIN:  None current but does endorse some left thigh pain intermittent  OBJECTIVE:   TODAY'S TREATMENT:   06/22/23     Therapeutic Activities: dynamic therapeutic activities designed to achieve improved functional performance -all activities performed in chair today.  Stand pivot transfer training- Patient able to scoot out to edge of seat (by reclining herself and scooting trunk forward toward edge of seat. She was able to anterior trunk lean but required assist to keep RLE and LLE flexed - blocking feet and knees after turning feet 1/4 turn for safe transfer- Max assist of 1 to stand and then pivot and safety sit at [facility]. Patient denied any pain- able to assist at least 25% and stand for a few seconds attempting to move her Right LE    Attempted lateral scoot at [employer]- Patient unable - poor ability to ext left wrist and decrease grip strength.   Dips using hands on mat (Min assist for UE support on left) to avoid injury x several attempts.   Added grip handle devices (WIEDER) and patient was able to get some gluteal lift and shoulder depression.   Lateral Trunk shift (forearms to yoga block) x 15 reps   Ant/post weight shift (Forward/backward) trunk lean x 15 reps       PATIENT EDUCATION: Education details: Exercise technique Patient verbalized understanding.   HOME EXERCISE PROGRAM: No changes at this time  Access Code: [facility] URL: [email or web address] Date: 11/23/2021 Prepared by: Marina  Moser Exercises - Seated Gluteal Sets - 2 x daily - 7 x weekly - 2 sets - 10 reps - 5 hold - Seated Quad Set - 2 x daily - 7 x weekly - 2 sets - 10 reps - 5 hold - Seated Long Arc Quad -  2 x daily - 7 x weekly - 2 sets - 10 reps - 5 hold - Seated March - 2 x daily - 7 x weekly - 2 sets - 10 reps - 5 hold - Seated Hip Abduction - 2 x daily - 7 x weekly - 2 sets - 10 reps - 5 hold - Seated Shoulder Shrugs - 2 x daily - 7 x weekly - 2 sets - 10 reps - 5 hold - Wheelchair Pressure Relief - 2 x daily - 7 x weekly - 2 sets - 10 reps - 5 hold     GOALS: Goals reviewed with patient? Yes  SHORT TERM GOALS: Target date: 02/22/2022  Pt will be independent with initial UE strengthening HEP in order to improve strength and balance in order to decrease fall risk and improve function at home and work. Baseline: 10/19/2021- patient with no formal UE HEP; 12/02/2021= Patient verbalized knowledge of HEP including use of theraband for UE strengthening.  Goal status: GOAL MET  LONG TERM GOALS: Target date: 09/12/2023   Pt will be independent with final for UE/LE HEP in order to improve strength and balance in order to decrease fall risk and improve function at home and work. Baseline: Patient is currently BLE NWB and unable to participate in HEP. Has order for UE strengthening. 01/11/2022- Patient now able to participate in LE strengthening although NWB still- good understanding for some basic exercises. Will keep goal active to incorporate progressive LE strengthening exercises. 04/07/2022= Patient still participating in progressive LE seated HEP and has no questions at this time.  Goal status: MET  2.  Pt will improve FOTO to target score of 35  to display perceived improvements in ability to complete ADL's.  Baseline: 10/19/2021= 12; 12/02/2021= 17; 01/11/2022=17; 04/07/2022= 17; 07/05/2022=17; 10/06/2022- outcome measure not appropriate as patient in non-ambulatory and not consistently standing. Goal status: Goal not appropriate  3.  Pt will increase strength of B UE  by at least 1/2 MMT grade in order to demonstrate improvement in strength and function  Baseline: patient has range of 2-/5 to 4/5 BUE  Strength; 12/02/2021= 4/5 except with wrist ext. 01/11/2022= 4/5 B UE strength expept for wrist Goal status: Goal revised-no longer appropriate- working with OT on all UE strengthening.   4. Pt. Will increase strength of RLE by at least 1/2 MMT grade in order to demonstrate improvement in Standing/transfers.  Baseline: 2-/5 Right hip flex/knee ext/flex; 04/07/2022= 2- with right hip flex/knee ext (lacking 28 deg from zero) 4/8= Patient able to ext right knee lacking 18 deg from zero. 07/05/2022=Left knee approx 8 deg from zero and right knee = 23 deg from zero; 12/29/2022 - Left LE = full ROM 4-/5 and right knee ext= 23 deg from zero= 2+/5 (gravity minimized); 03/28/2023= Left knee ext=  4/5 and right knee ext= 3-/5 (able to move lower leg through incomplete ROM -lacking 20 deg from full ROM against gravity); 05/23/2023= right knee ext= 3-/5 (able to move lower leg through incomplete ROM -lacking 17 deg from full ROM against gravity); 06/20/2023= Right knee ext= lacking 20 deg from zero  Goal status: Progressing  5. Pt. Will demo ability to stand pivot transfer with max assist for improved functional mobility and less dependent need on mechanical device.   Baseline: Dependent on hoyer lift for all transfers; 04/07/2022- Unable to test secondary to patient with recent UTI and right hand procedure - will attempt to reassess next visit. ; 04/12/22: Unable to successfully stand due to feet plates on loaned power w/c; 05/10/2022- Patient still having to use rental chair and has not received her original power w/c back to practice SPT. 07/04/2021= Patient just received new power w/c and attempted standing from 1st time today- Patient able to stand with max assist today from w/c and did not attempt to perform pivot transfer- difficulty with static standing and placing weight on right LE. 10/06/2022- Not assessed today- patient needs new AFO prior to attempting transfers; 12/29/2022= Patient has not attempted SPT yet - still trying  to improve LE strength enough to stand well without knee buckling with weight shifting. 03/28/2023- not attempted today due to ill fitting brace but patient last performed SPT on 01/10/23 and was able to perform with Max A with feet pre-positioned to angle toward direction of transfer; 05/23/2023- Not tested today but on last visit- patient able to perform SPT with max assist yet concern for twisting knee as she was unable to turn/pivot foot .  06/20/2023- deferred to next session due to patient fatigued with static standing today.  Goal status: ONGOING  6. Pt. Will demonstrate improved functional LE strength as seen by ability to stand > 2 min for improved transfer ability and pregait abilities.   Baseline: not assessed today due to recent dx: UTI; 04/07/2022- Unable to test secondary to patient with recent UTI and right hand procedure - will attempt to reassess next visit; 04/12/22: Unable to successfully stand due to feet plates on loaned power w/c 05/10/2022- Patient still having to use rental chair and has not received her original power w/c back to practice Standing. 07/05/2022- Patient able to stand with max assist at trunk today for 2 min 3 sec. Will keep goal active to ensure consistency. 10/06/2022= Unable to assess today- patient with some recent blood pressure issues and needs new AFO. 11/24/2022= Patient demonstrated multiple rounds of standing in // bars- holding up to 1:45 min today but has been able to exhibit 2 min in recent past- working on consistency. 12/29/2022= 2 min 40 sec with patient demonstrating improved overall posture and ability to contract glutes and quads with just min assist at trunk today.   Goal status: MET  7. Pt. Will demonstrate improved functional LE strength as seen by ability to stand > 5 min for improved transfer ability and pregait abilities. Baseline: 12/29/2022= 2 min 40 sec with patient demonstrating improved overall posture and ability to contract glutes and quads with just  min assist at trunk today. 03/28/2023= Attempted 2 trials today - at best 2 min 48 sec today but during cert- did stand for 3 min 44 sec. 06/20/2023= Patient able to stand 2x (45 sec and 1 min 12 sec)- max assist yet no slippage of LLE.  Goal Status: Ongoing- Patient has only been standing 2x since received new AFO shoe.  8. Patient will perform sit to stand transfer with moderate assist consistently > 75% of time for improved transfer ability and less dependence on caregiver.   Baseline: 12/29/2022- Patient currently max assist level assistance with all sit to stand activities. 03/28/2023= Patient is still majority of max assist to stand yet able to pull up from wheelchair smoother in one motion. 06/20/2023- Max Assist but has only most recently resumed standing since last week so goal still appropriate for new cert  Goal status: ONGOING   ASSESSMENT:  CLINICAL IMPRESSION:   Patient presents with good motivation today. She worked hard focusing on safe transfers and some mobility sitting at [facility]. She was unable to lateral scoot and will benefit from developing UE/core and LE strength to adequate perform.  Pt will continue to benefit from skilled PT services to optimize independence and reduced caregiver support.   OBJECTIVE IMPAIRMENTS Abnormal gait, decreased activity tolerance, decreased balance, decreased coordination, decreased endurance, decreased mobility, difficulty walking, decreased ROM, decreased strength, hypomobility, impaired flexibility, impaired UE functional use, postural dysfunction, and pain.   ACTIVITY LIMITATIONS carrying, lifting, bending, sitting, standing, squatting, sleeping, stairs, transfers, bed mobility, continence, bathing, toileting, dressing, self feeding, reach over head, hygiene/grooming, and caring for others  PARTICIPATION LIMITATIONS: meal prep, cleaning, laundry, medication management, personal finances, interpersonal relationship, driving, shopping, community  activity, and yard work  PERSONAL FACTORS Age, Time since onset of injury/illness/exacerbation, and 1-2 comorbidities: HTN, cervical Sx are also affecting patient's functional outcome.   REHAB POTENTIAL: Good  CLINICAL DECISION MAKING: Evolving/moderate complexity  EVALUATION COMPLEXITY: Moderate  PLAN: PT FREQUENCY: 1-2x/week  PT DURATION: 12 weeks  PLANNED INTERVENTIONS: Therapeutic exercises,  Therapeutic activity, Neuromuscular re-education, Balance training, Gait training, Patient/Family education, Self Care, Joint mobilization, DME instructions, Dry Needling, Electrical stimulation, Wheelchair mobility training, Spinal mobilization, Cryotherapy, Moist heat, and Manual therapy  PLAN FOR NEXT SESSION:   Continue with LE and core strength training  Sit to stand and static standing SPT as able.     1:14 PM, 06/22/23  1:14 PM, 06/22/23 Ossie Blend, PT Physical Therapist - [facility]  Outpatient Physical Therapy- Main Campus [phone]

## 2023-06-22 NOTE — Therapy (Signed)
 Occupational Therapy  Neuro Treatment Note  Patient Name: Sherry [name] MRN: [id number] DOB:29-Feb-1934, 89 y.o., female Today's Date: 11/29/2022  PCP:  Waunita Haff PROVIDER: Valrie Gehrig, MD  END OF SESSION:   OT End of Session - 06/22/23 1324     Visit Number 126    Number of Visits 192    Date for OT Re-Evaluation 08/17/23    OT Start Time 1100    OT Stop Time 1145    OT Time Calculation (min) 45 min    Equipment Utilized During Treatment powered wheelchair    Activity Tolerance Patient tolerated treatment well    Behavior During Therapy WFL for tasks assessed/performed                         Past Medical History:  Diagnosis Date   Acute blood loss anemia    Arthritis    Cancer (HCC)    skin   Central cord syndrome at C6 level of cervical spinal cord (HCC) 11/29/2017   Hypertension    Protein-calorie malnutrition, severe (HCC) 01/24/2018   S/P insertion of IVC (inferior vena caval) filter 01/24/2018   Tetraparesis (HCC)    Past Surgical History:  Procedure Laterality Date   ANTERIOR CERVICAL DECOMP/DISCECTOMY FUSION N/A 11/29/2017   Procedure: Cervical five-six, six-seven Anterior Cervical Decompression Fusion;  Surgeon: Cannon Champion, MD;  Location: [facility] OR;  Service: Neurosurgery;  Laterality: N/A;  Cervical five-six, six-seven Anterior Cervical Decompression Fusion   CATARACT EXTRACTION     FEMUR IM NAIL Left 08/16/2021   Procedure: INTRAMEDULLARY (IM) NAIL FEMORAL;  Surgeon: Marlynn Singer, MD;  Location: [facility] ORS;  Service: Orthopedics;  Laterality: Left;   IR IVC FILTER PLMT / S&I /IMG GUID/MOD SED  12/08/2017   Patient Active Problem List   Diagnosis Date Noted   Age-related osteoporosis without current pathological fracture 07/27/2022   Mild recurrent major depression (HCC) 07/27/2022   Hypertensive heart disease with other congestive heart failure (HCC) 02/14/2022   Chronic indwelling Foley catheter 02/14/2022   Closed hip fracture  (HCC) 08/15/2021   DVT (deep venous thrombosis) (HCC) 08/15/2021   Quadriplegia (HCC) 08/15/2021   Fall 08/15/2021   Constipation due to slow transit 08/31/2018   Trauma 06/05/2018   Neuropathic pain 06/05/2018   Neurogenic bowel 06/05/2018   Vaginal yeast infection 01/30/2018   Healthcare-associated pneumonia 01/25/2018   Chronic allergic rhinitis 01/24/2018   Depression with anxiety 01/24/2018   UTI due to Klebsiella species 01/24/2018   Protein-calorie malnutrition, severe (HCC) 01/24/2018   S/P insertion of IVC (inferior vena caval) filter 01/24/2018   Tetraparesis (HCC) 01/20/2018   Neurogenic bladder 01/20/2018   Reactive depression    Benign essential HTN    Acute postoperative anemia due to expected blood loss    Central cord syndrome at C6 level of cervical spinal cord (HCC) 11/29/2017   Allergy to alpha-gal 11/25/2016   SCC (squamous cell carcinoma) 04/08/2014   ONSET DATE: 11/29/2017  REFERRING DIAG: Central Cord Syndrome at C6 of the Cervical Spinal Cord, Fall with Bilateral Closed Hip Fractures, with ORIF repair with Intramedullary Nailing of the Left Hip Fracture.   THERAPY DIAG:  Muscle weakness (generalized)  Other lack of coordination  Rationale for Evaluation and Treatment: Rehabilitation  SUBJECTIVE:  SUBJECTIVE STATEMENT:  Pt. Reports doing okay today.   Pt accompanied by: self, Personal Care aide  PERTINENT HISTORY: Central Cord Syndrome at C6 of the Cervical Spinal Cord, Fall with Bilateral Closed Hip Fractures,  with ORIF repair with Intramedullary Nailing of the Left Hip Fracture.   PRECAUTIONS: None  WEIGHT BEARING RESTRICTIONS: No  PAIN:  Are you having pain? No pain   LIVING ENVIRONMENT: Lives with: [facility] Stairs: No Has following equipment at home: Wheelchair (power)  PLOF: Independent   PATIENT GOALS:  See below for established goals  OBJECTIVE:  Note: Objective measures were completed at Evaluation  unless otherwise noted.  HAND DOMINANCE: Right  ADLs: Caregiver assist with ADLs, and [facility]  MOBILITY STATUS: Uses a power w/c  ACTIVITY TOLERANCE: Activity tolerance: Fair  FUNCTIONAL OUTCOME MEASURES:  Measurements:   12/09/2021:   Shoulder flexion: R: 120(136), L: 138(150) Shoulder abduction: R: 108(120), L: 110(120) Elbow: R: 0-148, L: 0-146 Wrist flexion: R: 55(62), L: 78 Writ extension: R: 34(55), L: 3(4) RD: R: 12(20), L: 16(26) UD: R: 8(24), L: 6(18) Thumb radial abduction: R: 11(20), L: 8(20) Digit flexion to the [facility]: R:  2nd: 5cm(3.5cm), 3rd: 7.5 cm(5cm), 4th: 7cm(3cm), 5th: 6cm(5.5cm) L: 2nd: 8cm(8cm), 3rd: 8cm(8cm), 4th: 7.5cm(7cm), 5th: 7cm(7cm)   02/03/2022:   Shoulder flexion: R: 122(138), L: 148(154) Shoulder abduction: R: 109(125), L: 125(125 Elbow: R: 0-148, L: 0-146 Wrist flexion: R: 60(68), L: 79 Writ extension: R: 40(55), L: 3(10) RD: R: 14(24), L: 20(28) UD: R: 8(24), L: 6(18) Thumb radial abduction: R: 20(25), L: 8(20) Digit flexion to the [facility]: R:  2nd: 4.5cm(3cm), 3rd: 6.5cm(4.5cm), 4th: 6.5 cm(3cm), 5th: 4.5cm(5cm) L: 2nd: 8cm(8cm), 3rd: 8cm(7cm), 4th: 7.5cm(7cm), 5th: 6.5cm(6.5cm)   04/07/2022:   Shoulder flexion: R: 125(150), L: 160(160) Shoulder abduction: R: 109(125), L: 125(125 Elbow: R: 0-148, L: 0-146 Wrist flexion: R: 60(68), L: 79 Writ extension: R: 40(55), L: 5(18) RD: R: 14(24), L: 20(28) UD: R: 18(24), L: 8(18) Thumb radial abduction: R: 20(25), L: 8(20) Digit flexion to the [facility]: R:  2nd: 4.5 cm(4 cm), 3rd: 6.5cm(3cm), 4th: 7 cm (5cm), 5th: 5cm(5cm) L: 2nd: 8cm(8cm), 3rd: 8cm(7cm), 4th: 7.5cm(7cm), 5th: 7cm(7cm)   07/05/2022:   Shoulder flexion: R: 125(154), L: 160(160) Shoulder abduction: R: 95(130), L: 143(143) Elbow: R: 0-148, L: 0-146 Wrist flexion: R: 60(68), L: 79 Writ extension: R: 40(60), L: 0(12) RD: R: 10(24), L: 12(20) UD: R: 16(24), L: 8(16) Thumb radial abduction: R: 20(25), L: 8(20) Digit  flexion to the [facility]: R:  2nd: 4 cm(4cm), 3rd: 6.5cm( 3.5cm), 4th: 7 cm (5cm), 5th: 6cm(5cm) L: 2nd: 8cm(8cm), 3rd: 8cm(7cm), 4th: 8cm(7cm), 5th: 7cm(7cm)   09/13/2022:   Shoulder flexion: R: 125(154), L: 160(160) Shoulder abduction: R: 100(130), L: 143(143) Elbow: R: 0-150, L: 0-150 Wrist flexion: R: 55(68), L: 79 Writ extension: R: 45(60), L: 0(12) RD: R: 10(24), L: 12(20) UD: R: 16(24), L: 8(16) Thumb radial abduction: R: 20(25), L: 8(20) Digit flexion to the [facility]: R:  2nd: 4 cm(4cm), 3rd: 6cm( 5cm), 4th: 6 cm (4cm), 5th: 5cm(5cm) L: 2nd: 7cm(cm), 3rd: 7cm(6cm), 4th: 7cm(6cm), 5th: 6cm(6cm)   10/27/2022   Shoulder flexion: R: 128(154), L: 160(160) Shoulder abduction: R: 105(130), L: 143(143) Elbow: R: 0-150, L: 0-150 Wrist flexion: R: 55(68), L: 79 Writ extension: R: 46(64), L: 0(20) RD: R: 10(20), L: 18(24) UD: R: 18(28), L: 8(20) Thumb radial abduction: R: 30(42), L:20(24) Digit flexion to the [facility]: R:  2nd: 4 cm(3cm), 3rd: 6cm( 6cm), 4th: 6 cm (5cm), 5th: 5cm(5cm) L: 2nd: 8cm(8cm), 3rd: 8cm(6cm), 4th: 8cm(7cm), 5th: 6cm(6cm)  01/19/2023  Shoulder flexion: R: 131(154), L: 160(160) Shoulder abduction: R: 110(130), L: 143(143) Elbow: R: 0-150, L: 0-150 Wrist flexion: R:  55(68), L: 79 Writ extension: R: 46(64), L: 0(20) RD: R: 10(20), L: 18(24) UD: R: 18(28), L: 14(20) Thumb radial abduction: R: 38(48), L:24(26) Thumb IP flexion: R: 35(50) L: 20(45) Digit flexion to the [facility]: R:  2nd: 3.5 cm(3cm), 3rd: 6cm( 6cm), 4th: 6 cm (5cm), 5th: 5cm(4.5cm) L: 2nd: 8cm(7.5cm), 3rd: 8cm(6cm), 4th: 7.5cm(6cm), 5th: 6cm(4.5cm)  04/04/2023  Shoulder flexion: R: 134(154), L: 160(160) Shoulder abduction: R: 114(130), L: 143(143) Elbow: R: WNL, L: WNL Wrist flexion: R: 55(68), L: 80 Writ extension: R: 46(64), L: -10(10) RD: R: 14(24), L: 20(24) UD: R: 20(30), L: 14(20) Thumb radial abduction: R: 38(48), L:28(30) Thumb IP flexion: R: 40(50) L: 25(45) Digit flexion to the [facility]: R:   2nd: 3.5 cm(3cm), 3rd: 6cm( 5cm), 4th: 6 cm (4cm), 5th: 5cm(5cm) L: 2nd: 8cm(7.5cm), 3rd: 8cm(7cm), 4th: 7.5cm(7cm), 5th: 7cm(6cm)  05/25/2023  Shoulder flexion: R: 134(154), L: 160(160) Shoulder abduction: R: 117(134), L: 143(143) Elbow: R: WNL, L: WNL Wrist flexion: R: 55(68), L: 80 Wrist extension: R: 46(64), L: -8(14) RD: R: 14(24), L: 20(24) UD: R: 20(32), L: 14(22) Thumb radial abduction: R: 38(50), L:28(30) Thumb IP flexion: R: 40(50) L: 30(45) Digit flexion to the [facility]: R:  2nd: 3.5 cm(3cm), 3rd: 6cm( 5cm), 4th: 7 cm (4cm), 5th: 5cm(4cm) L: 2nd: 8cm(7.5cm), 3rd: 8cm(7cm), 4th: 7.5cm(7cm), 5th: 7cm(6cm)  COORDINATION:   01/19/2023:  9 hole peg test:  Right: 2 pegs placed in 1 min. & 5 sec.  Pt. was able to remove 9 pegs in 17 sec.  Left: 2 pegs placed in 1 min. & 10 sec. Pt. was able to remove 9 pegs in 20 sec.  04/04/23:  Pt. was able to consistently grasp the pegs however each one slipped out of her fingers when attempting to place them into the Pegboard.  05/25/2023:   Right: 9 pegs placed and removed in 2 min. & 41 sec. Left: Unable to grasp the pegs for a horizontal position.  SENSATION: Intact  EDEMA: N/A   COGNITION: Overall cognitive status: WNL   VISION: Subjective report: Wears glasses, No changes in vision.  PERCEPTION: Intact  TODAY'S TREATMENT:                                                                                                                              DATE:  06/22/23  Moist heat for Bilateral shoulder shoulders, and bilateral hands for muscle relaxation used simultaneous to passive stretching noted below  Therapeutic Ex.:  -Pt. tolerated PROM followed by AROM bilateral wrist extension, PROM for bilateral digit MP, PIP, and DIP flexion, and extension, thumb abduction.  -Lateral, and 3pt. Pinch strengthening using yellow level resistive clip to prepare the hand for using a reacher. -Pt. Worked on BUE strengthening, and reciprocal  motion using the UBE while seated for 8 min. with min-moderate resistance.   Therapeutic Activities:  -EZ Board exercises performed to target forearm supination/pronation, wrist flexion/extension using gross grasp, and lateral pinch (key) grasp in  multiple  planes to promote shoulder flexion, abduction, and wrist flexion, and extension while performing resistive wrist flexion and extension with a gross grip.  . Facilitated bilateral hand coordination skills opening containers      PATIENT EDUCATION: Education details: Bilateral hand and shoulder ROM Person educated: Patient Education method: Explanation, Demonstration, and Verbal cues Education comprehension: verbalized understanding and returned demonstration  HOME EXERCISE PROGRAM: Continue to assess HEP needs, and provide as needed.   GOALS: Goals reviewed with patient? Yes  LONG TERM GOALS: Target date: 08/17/2023  3.  Patient will independently use a reacher to pick up items from various surfaces.  Baseline: 05/25/2023:  Pt. is now able to initiate grasping a reacher to open and close the reacher with the right hand, however requires the grasp handle to be repositioned  within the hand. Pt. requires work on using/moving the reacher with the right hand through various planes. 04/04/2023: Pt. continues to work on improving Digit flexion to the [facility] to be able to efficiently handle Adaptive equipment. 01/19/2023: Pt. Is improving with Bilateral digit flexion to the [facility]. Pt. has difficulty firmly holding adaptive devices. 12/06/2022: Pt. continues to present with increased MP, PIP, and DIP digit tightness/stiffness limiting the formulation of bilateral composites fists. 10/27/2022: Pt. presents with increased MP, PIP, and DIP digit tightness/stiffness limiting the formulation of bilateral composites fists during this progress reporting period limiting. Digit flexion to the [facility]: R:  2nd: 4 cm(3cm), 3rd: 6cm( 6cm), 4th: 6 cm (5cm), 5th: 5cm(5cm),  L: 2nd: 8cm(8cm), 3rd: 8cm(6cm), 4th: 8cm(7cm), 5th: 6cm(6cm) 09/20/2022: Improving digit flexion to the [facility]. 09/13/22: improved digit flexion to the [facility]. 07/05/2022: Pt. presents with digit MP, PIP, and DIP extensor tightness limiting her ability to securely grip objects in her bilateral hands.  04/07/2022: Pt. Has improved with right 2nd, and 3rd digit flexion towards the [facility]. Pt. Continues to have difficulty securely holding and applying deodorant.02/03/2022: Pt. Has improved with digit flexion, however, continues to have difficulty securely holding and using deodorant. 12/09/2021: Bilateral hand/digit MP, PIP, and DIP extension tightness limits her ability achieve digit flexion to hold, and apply deodorant. 10/28/2021:  Pt. continues to have difficulty holding the deodorant. 01/12/2021: Pt. Presents with limited digit extension. Pt. Is able to initiate holding deodorant, however is unable to hold it while using it  Goal status: Ongoing   4.  Pt. Will improve bilateral wrist extension in preparation for anticipating, and initiating reaching for objects at the table.  Baseline: 05/25/23: Wrist extension: R: 46(64), L: -8(14) 04/04/23: R: 46(64) L: -10(10)01/19/2023:  R: 46(64) L: 0(20) 12/06/2022: Pt continues to to progress with bilateral wrist extension in preparation fro functional reaching. 10/27/2022: Pt. Is improving with bilateral wrist extension. R: 46(64) L: 0(20) 09/20/2022: limited PROM in the Left wrist extension 2/2 tightness/stiffness. 09/13/22: R: 55(68), L: 0(12) 07/19/2022: Bilateral wrist extension in limited. 07/05/2022: Right: 40(60) Left: 0(12) 04/07/2022: Right: 40(55) Left: 5(18) 02/03/2022: Right: 34(55) Left: 3(4) 12/09/2021: Right  34(55)  Left  3(4) 10/28/2021:  Right 22(38), Left 0(15) 09/14/2021:  Right: 17(35), left 2(15)  Goal status: Progressing/Ongoing  7.  Pt. Will increase bilateral lateral pinch strength by 2 lbs to be able to securely grasp items during ADLs, and IADL tasks.  Baseline:  01/19/2023: Deferred as the Pinch meter is out for calibration.12/06/2022: Pt. Is able to more securely hold objects during ADLs/IADLs 10/27/2022: Pt. Is improving with holding items with her bilateral thumbs. (Pinch meter out for calibration) 09/20/2022: lateral pinch continues to be  limited 09/13/22: R 6.5# L 4# 07/19/2022: TBD 07/05/2022: TBD 07/2022: NT-Pinch meter out for calibration. 02/03/2022: Right: 6#, Left: 4# 12/09/2021: Right: 6#, Left: 4#  Goal status: Deferred   9.  Pt. will complete plant care with modified independence.  Baseline:12/06/2022: Pt. Is independent with plant care in her husband's room, however is not able to access her plants due to furniture blocking access to them. 10/27/2022: Pt. Is able to water plants that are closer, and within reach. Pt. continues to have difficulty reaching for thorough plant care. 09/20/2022: Pt. Continues to have difficulty reaching for thorough plant care. 09/13/22: continues to report intermittent difficulty 07/19/2022: Pt. Continues to be able to water, and care for some of her plants. Pt. Has more difficulty with plants that are harder to reach. 07/05/2022: pt. Is able to water, and care for some of her plants. Pt. Has more difficulty with plants that are harder to reach. 04/07/2022: Pt. is now able to hold a cup and water her plants.02/03/2022: Pt. has difficulty caring for her plants.  Goal status:Achieved   10.  Pt. will demonstrate adaptive techniques to assist with the efficiency of self-dressing, or morning care tasks.  Baseline: 05/25/2023: Pt. continues to work on improving UE functioning to be able to efficiently use Adaptive equipment.  04/04/2023:  Pt. continues to work on improving UE functioning to be able to efficiently use Adaptive equipment. 01/19/2203: Pt. Is able to donn her jacket in reverse independently. Pt. requires assist from staff, as Pt. Reports staff have decreased time. 12/06/2022: Pt. requires assist from staff, as Pt. Reports staff  have decreased time. 10/27/2022: Pt. is able to assist with initiating UE dressing. Pt. requires assist from personal, and staff care aides. 09/20/2022: Pt. continues to require assist form personal, and staff care aides. 09/13/22: MODA dressing, reports she is often rushed 07/19/2022: Pt. continues to require assist with self-dressing/morning care tasks. 07/05/2022: Continue with goal. 04/07/2022: Pt. Continues to require assist with the efficiency of self-dressing, and morning care tasks.02/03/2022: Pt. requires assist from caregivers 2/2 time limitations during morning care.  Goal status: Ongoing  11.  Pt. will improve BUE strength to improve ADL, and IADL functioning.  Baseline: 05/25/2023: shoulder flexion L 4/5, R 4-/5; shoulder abduction L 4+/5, R 4-/5. elbow flexion B 5/5, elbow extension 4+/5 01/19/2023: L 4/5, R 3+/5  04/04/2023: shoulder flexion L 4/5, R 3+/5; shoulder abduction L 4+/5, R 3+/5. elbow flexion B 5/5, elbow extension 4/5 01/19/2023: L 4/5, R 3+/5 12/06/2022: Continue 10/27/2022: shoulder flexion L 4/5, R 3+/5; shoulder abduction L 4+/5, R 3+/5. elbow flexion B 5/5, elbow extension 4/5 09/20/2022: Pt. Presents with limited BUE strength 09/13/22: shoulder flexion L 4/5, R 3/5; elbow flexion B 5/5, elbow extension 4-/5; shoulder abduction L 4+/5, R 3+/5. 07/19/2022: BUE strength continues to be limited. 07/05/2022: shoulder flexion: right 4-/5, abduction: 3+/5, elbow flexion: right: 5/5, left 5/5, extension: right: 4-/5, left 4-/5, wrist extension: right: 3-/5, left: 2-/5  Goal status: Ongoing  12.  Pt. will improve bilateral thumb radial abduction in order to be able to hold the grab bars while standing with PT  Baseline: 05/25/23: Thumb radial abduction: R: 38(50), L:28(30) 04/04/23: R: 38(48), L:28(30) 01/19/2023: R: 38(48) L: 24(26)12/06/2022: Pt. Continues to present  with limited thumb abduction, however is improving holding onto the parallel bars.10/27/2022: thumb radial abduction: R: 30(42), L:  20(24  Goal status: Ongoing  13.  Pt. Will be able to securely hold, and stabilize medication bottles at a tabletop surface  when opening, and closing them.  Baseline: 01/19/2023: Pt. Is now able to securely hold medication bottles while opening them, and stabilizes them on surfaces while opening them. 12/06/2022: Pt is improving with securely stabilizing medication bottles. 10/27/2022: Pt. Stabilizes bottles against her torso when attempting to open, and close them   Goal status: Achieved  14: Pt. Will improve bilateral thumb IP flexion to improve active grasping patterns.    Baseline: 05/25/2023: Thumb IP flexion: R: 40(50) L: 30(45) 04/04/2023:  R: 40(50) L: 25(45) 01/19/2023: Right 35(50), Left: 20/45)    Goal status: Ongoing    15. Pt. Will improve bilateral FMC/speed, and dexterity. As evidence by improved scores on the 9 hole peg test.  Baseline: 05/25/2023: 9 Hole pegs test speed/dexterity: Right: 9 pegs placed and removed in 2 min. & 41 sec. Left: Unable to grasp the pegs for a horizontal position. 04/04/23: Pt. was able to consistently grasp the pegs however each one slipped out of her fingers when attempting to place them into the Pegboard. 01/19/2023: 2 pegs placed in 1 min. & 5 sec.  Pt. was able to remove 9 pegs in 17 sec.; Left: 2 pegs placed in 1 min. & 10 sec. Pt. was able to remove 9 pegs in 20 sec.   Goal status: Ongoing  ASSESSMENT:  CLINICAL IMPRESSION:  Pt. was able to complete right hand grasp pattern for gross grip, and lateral grasp attachments on the EZ-Board. Pt. had difficulty using the large, and small dial attachments. Pt. continues to present with bilateral digit MP, PIP, and DIP extensor tightness with increased left wrist flexor tightness, and digit MP, PIP, and DIP tightness. Pt. continues to benefit from OT services to work on impoving ROM, and UE strength in order to work towards increasing bilateral hand grasp on objects, and increasing engagement of the bilateral  hands during ADLs, and IADL tasks.     PERFORMANCE DE PERFORMANCE DEFICITS: in functional skills including ADLs, IADLs, coordination, dexterity, ROM, strength, and UE functional use, cognitive skills including , and psychosocial skills including coping strategies and environmental adaptation.   IMPAIRMENTS: are limiting patient from ADLs, IADLs, and leisure.   CO-MORBIDITIES: may have co-morbidities  that affects occupational performance. Patient will benefit from skilled OT to address above impairments and improve overall function.  MODIFICATION OR ASSISTANCE TO COMPLETE EVALUATION: Min-Moderate modification of tasks or assist with assess necessary to complete an evaluation.  OT OCCUPATIONAL PROFILE AND HISTORY: Detailed assessment: Review of records and additional review of physical, cognitive, psychosocial history related to current functional performance.  CLINICAL DECISION MAKING: Moderate - several treatment options, min-mod task modification necessary  REHAB POTENTIAL: Good for stated goals  PLAN:  OT FREQUENCY 2x's a week  OT DURATION: 12 weeks  PLANNED INTERVENTIONS ADL training, A/E training, UE ther. Ex, Manual therapy, neuromuscular re-education, moist heat modality, Paraffin Bath, Splinting, and  Pt./caregiver education   RECOMMENDED OTHER SERVICES: PT  CONSULTED AND AGREED WITH PLAN OF CARE: Patient  PLAN FOR NEXT SESSION: Continue Treatment as per established POC  Duey Ghent, MS, OTR/L   06/22/23, 1:27 PM

## 2023-06-29 ENCOUNTER — Ambulatory Visit: Payer: Medicare HMO | Admitting: Occupational Therapy

## 2023-06-29 ENCOUNTER — Ambulatory Visit: Payer: Medicare HMO

## 2023-06-29 DIAGNOSIS — R2689 Other abnormalities of gait and mobility: Secondary | ICD-10-CM

## 2023-06-29 DIAGNOSIS — M6281 Muscle weakness (generalized): Secondary | ICD-10-CM

## 2023-06-29 DIAGNOSIS — R2681 Unsteadiness on feet: Secondary | ICD-10-CM

## 2023-06-29 DIAGNOSIS — R262 Difficulty in walking, not elsewhere classified: Secondary | ICD-10-CM

## 2023-06-29 DIAGNOSIS — S14129S Central cord syndrome at unspecified level of cervical spinal cord, sequela: Secondary | ICD-10-CM

## 2023-06-29 DIAGNOSIS — R278 Other lack of coordination: Secondary | ICD-10-CM

## 2023-06-29 DIAGNOSIS — R269 Unspecified abnormalities of gait and mobility: Secondary | ICD-10-CM

## 2023-06-29 DIAGNOSIS — S72001A Fracture of unspecified part of neck of right femur, initial encounter for closed fracture: Secondary | ICD-10-CM

## 2023-06-29 NOTE — Therapy (Signed)
 Occupational Therapy  Neuro Treatment Note  Patient Name: Sherry [name] MRN: [id number] DOB:24-Apr-1934, 89 y.o., female Today's Date: 11/29/2022  PCP:  Waunita Haff PROVIDER: Valrie Gehrig, MD  END OF SESSION:   OT End of Session - 06/29/23 1525     Visit Number 127    Number of Visits 192    Date for OT Re-Evaluation 08/17/23    OT Start Time 1105    OT Stop Time 1145    OT Time Calculation (min) 40 min    Activity Tolerance Patient tolerated treatment well    Behavior During Therapy WFL for tasks assessed/performed                         Past Medical History:  Diagnosis Date   Acute blood loss anemia    Arthritis    Cancer (HCC)    skin   Central cord syndrome at C6 level of cervical spinal cord (HCC) 11/29/2017   Hypertension    Protein-calorie malnutrition, severe (HCC) 01/24/2018   S/P insertion of IVC (inferior vena caval) filter 01/24/2018   Tetraparesis (HCC)    Past Surgical History:  Procedure Laterality Date   ANTERIOR CERVICAL DECOMP/DISCECTOMY FUSION N/A 11/29/2017   Procedure: Cervical five-six, six-seven Anterior Cervical Decompression Fusion;  Surgeon: Cannon Champion, MD;  Location: [facility] OR;  Service: Neurosurgery;  Laterality: N/A;  Cervical five-six, six-seven Anterior Cervical Decompression Fusion   CATARACT EXTRACTION     FEMUR IM NAIL Left 08/16/2021   Procedure: INTRAMEDULLARY (IM) NAIL FEMORAL;  Surgeon: Marlynn Singer, MD;  Location: [facility] ORS;  Service: Orthopedics;  Laterality: Left;   IR IVC FILTER PLMT / S&I /IMG GUID/MOD SED  12/08/2017   Patient Active Problem List   Diagnosis Date Noted   Age-related osteoporosis without current pathological fracture 07/27/2022   Mild recurrent major depression (HCC) 07/27/2022   Hypertensive heart disease with other congestive heart failure (HCC) 02/14/2022   Chronic indwelling Foley catheter 02/14/2022   Closed hip fracture (HCC) 08/15/2021   DVT (deep venous thrombosis) (HCC)  08/15/2021   Quadriplegia (HCC) 08/15/2021   Fall 08/15/2021   Constipation due to slow transit 08/31/2018   Trauma 06/05/2018   Neuropathic pain 06/05/2018   Neurogenic bowel 06/05/2018   Vaginal yeast infection 01/30/2018   Healthcare-associated pneumonia 01/25/2018   Chronic allergic rhinitis 01/24/2018   Depression with anxiety 01/24/2018   UTI due to Klebsiella species 01/24/2018   Protein-calorie malnutrition, severe (HCC) 01/24/2018   S/P insertion of IVC (inferior vena caval) filter 01/24/2018   Tetraparesis (HCC) 01/20/2018   Neurogenic bladder 01/20/2018   Reactive depression    Benign essential HTN    Acute postoperative anemia due to expected blood loss    Central cord syndrome at C6 level of cervical spinal cord (HCC) 11/29/2017   Allergy to alpha-gal 11/25/2016   SCC (squamous cell carcinoma) 04/08/2014   ONSET DATE: 11/29/2017  REFERRING DIAG: Central Cord Syndrome at C6 of the Cervical Spinal Cord, Fall with Bilateral Closed Hip Fractures, with ORIF repair with Intramedullary Nailing of the Left Hip Fracture.   THERAPY DIAG:  Muscle weakness (generalized)  Other lack of coordination  Rationale for Evaluation and Treatment: Rehabilitation  SUBJECTIVE:  SUBJECTIVE STATEMENT:  Pt. Reports doing okay today.   Pt accompanied by: self, Personal Care aide  PERTINENT HISTORY: Central Cord Syndrome at C6 of the Cervical Spinal Cord, Fall with Bilateral Closed Hip Fractures, with ORIF repair with Intramedullary Nailing of the Left  Hip Fracture.   PRECAUTIONS: None  WEIGHT BEARING RESTRICTIONS: No  PAIN:  Are you having pain? No pain   LIVING ENVIRONMENT: Lives with: [facility] Stairs: No Has following equipment at home: Wheelchair (power)  PLOF: Independent   PATIENT GOALS:  See below for established goals  OBJECTIVE:  Note: Objective measures were completed at Evaluation unless otherwise noted.  HAND DOMINANCE:  Right  ADLs: Caregiver assist with ADLs, and [facility]  MOBILITY STATUS: Uses a power w/c  ACTIVITY TOLERANCE: Activity tolerance: Fair  FUNCTIONAL OUTCOME MEASURES:  Measurements:   12/09/2021:   Shoulder flexion: R: 120(136), L: 138(150) Shoulder abduction: R: 108(120), L: 110(120) Elbow: R: 0-148, L: 0-146 Wrist flexion: R: 55(62), L: 78 Writ extension: R: 34(55), L: 3(4) RD: R: 12(20), L: 16(26) UD: R: 8(24), L: 6(18) Thumb radial abduction: R: 11(20), L: 8(20) Digit flexion to the [facility]: R:  2nd: 5cm(3.5cm), 3rd: 7.5 cm(5cm), 4th: 7cm(3cm), 5th: 6cm(5.5cm) L: 2nd: 8cm(8cm), 3rd: 8cm(8cm), 4th: 7.5cm(7cm), 5th: 7cm(7cm)   02/03/2022:   Shoulder flexion: R: 122(138), L: 148(154) Shoulder abduction: R: 109(125), L: 125(125 Elbow: R: 0-148, L: 0-146 Wrist flexion: R: 60(68), L: 79 Writ extension: R: 40(55), L: 3(10) RD: R: 14(24), L: 20(28) UD: R: 8(24), L: 6(18) Thumb radial abduction: R: 20(25), L: 8(20) Digit flexion to the [facility]: R:  2nd: 4.5cm(3cm), 3rd: 6.5cm(4.5cm), 4th: 6.5 cm(3cm), 5th: 4.5cm(5cm) L: 2nd: 8cm(8cm), 3rd: 8cm(7cm), 4th: 7.5cm(7cm), 5th: 6.5cm(6.5cm)   04/07/2022:   Shoulder flexion: R: 125(150), L: 160(160) Shoulder abduction: R: 109(125), L: 125(125 Elbow: R: 0-148, L: 0-146 Wrist flexion: R: 60(68), L: 79 Writ extension: R: 40(55), L: 5(18) RD: R: 14(24), L: 20(28) UD: R: 18(24), L: 8(18) Thumb radial abduction: R: 20(25), L: 8(20) Digit flexion to the [facility]: R:  2nd: 4.5 cm(4 cm), 3rd: 6.5cm(3cm), 4th: 7 cm (5cm), 5th: 5cm(5cm) L: 2nd: 8cm(8cm), 3rd: 8cm(7cm), 4th: 7.5cm(7cm), 5th: 7cm(7cm)   07/05/2022:   Shoulder flexion: R: 125(154), L: 160(160) Shoulder abduction: R: 95(130), L: 143(143) Elbow: R: 0-148, L: 0-146 Wrist flexion: R: 60(68), L: 79 Writ extension: R: 40(60), L: 0(12) RD: R: 10(24), L: 12(20) UD: R: 16(24), L: 8(16) Thumb radial abduction: R: 20(25), L: 8(20) Digit flexion to the [facility]: R:  2nd: 4 cm(4cm), 3rd:  6.5cm( 3.5cm), 4th: 7 cm (5cm), 5th: 6cm(5cm) L: 2nd: 8cm(8cm), 3rd: 8cm(7cm), 4th: 8cm(7cm), 5th: 7cm(7cm)   09/13/2022:   Shoulder flexion: R: 125(154), L: 160(160) Shoulder abduction: R: 100(130), L: 143(143) Elbow: R: 0-150, L: 0-150 Wrist flexion: R: 55(68), L: 79 Writ extension: R: 45(60), L: 0(12) RD: R: 10(24), L: 12(20) UD: R: 16(24), L: 8(16) Thumb radial abduction: R: 20(25), L: 8(20) Digit flexion to the [facility]: R:  2nd: 4 cm(4cm), 3rd: 6cm( 5cm), 4th: 6 cm (4cm), 5th: 5cm(5cm) L: 2nd: 7cm(cm), 3rd: 7cm(6cm), 4th: 7cm(6cm), 5th: 6cm(6cm)   10/27/2022   Shoulder flexion: R: 128(154), L: 160(160) Shoulder abduction: R: 105(130), L: 143(143) Elbow: R: 0-150, L: 0-150 Wrist flexion: R: 55(68), L: 79 Writ extension: R: 46(64), L: 0(20) RD: R: 10(20), L: 18(24) UD: R: 18(28), L: 8(20) Thumb radial abduction: R: 30(42), L:20(24) Digit flexion to the [facility]: R:  2nd: 4 cm(3cm), 3rd: 6cm( 6cm), 4th: 6 cm (5cm), 5th: 5cm(5cm) L: 2nd: 8cm(8cm), 3rd: 8cm(6cm), 4th: 8cm(7cm), 5th: 6cm(6cm)  01/19/2023  Shoulder flexion: R: 131(154), L: 160(160) Shoulder abduction: R: 110(130), L: 143(143) Elbow: R: 0-150, L: 0-150 Wrist flexion: R: 55(68), L: 79 Writ extension: R: 46(64), L: 0(20)  RD: R: 10(20), L: 18(24) UD: R: 18(28), L: 14(20) Thumb radial abduction: R: 38(48), L:24(26) Thumb IP flexion: R: 35(50) L: 20(45) Digit flexion to the [facility]: R:  2nd: 3.5 cm(3cm), 3rd: 6cm( 6cm), 4th: 6 cm (5cm), 5th: 5cm(4.5cm) L: 2nd: 8cm(7.5cm), 3rd: 8cm(6cm), 4th: 7.5cm(6cm), 5th: 6cm(4.5cm)  04/04/2023  Shoulder flexion: R: 134(154), L: 160(160) Shoulder abduction: R: 114(130), L: 143(143) Elbow: R: WNL, L: WNL Wrist flexion: R: 55(68), L: 80 Writ extension: R: 46(64), L: -10(10) RD: R: 14(24), L: 20(24) UD: R: 20(30), L: 14(20) Thumb radial abduction: R: 38(48), L:28(30) Thumb IP flexion: R: 40(50) L: 25(45) Digit flexion to the [facility]: R:  2nd: 3.5 cm(3cm), 3rd: 6cm( 5cm), 4th: 6 cm  (4cm), 5th: 5cm(5cm) L: 2nd: 8cm(7.5cm), 3rd: 8cm(7cm), 4th: 7.5cm(7cm), 5th: 7cm(6cm)  05/25/2023  Shoulder flexion: R: 134(154), L: 160(160) Shoulder abduction: R: 117(134), L: 143(143) Elbow: R: WNL, L: WNL Wrist flexion: R: 55(68), L: 80 Wrist extension: R: 46(64), L: -8(14) RD: R: 14(24), L: 20(24) UD: R: 20(32), L: 14(22) Thumb radial abduction: R: 38(50), L:28(30) Thumb IP flexion: R: 40(50) L: 30(45) Digit flexion to the [facility]: R:  2nd: 3.5 cm(3cm), 3rd: 6cm( 5cm), 4th: 7 cm (4cm), 5th: 5cm(4cm) L: 2nd: 8cm(7.5cm), 3rd: 8cm(7cm), 4th: 7.5cm(7cm), 5th: 7cm(6cm)  COORDINATION:   01/19/2023:  9 hole peg test:  Right: 2 pegs placed in 1 min. & 5 sec.  Pt. was able to remove 9 pegs in 17 sec.  Left: 2 pegs placed in 1 min. & 10 sec. Pt. was able to remove 9 pegs in 20 sec.  04/04/23:  Pt. was able to consistently grasp the pegs however each one slipped out of her fingers when attempting to place them into the Pegboard.  05/25/2023:   Right: 9 pegs placed and removed in 2 min. & 41 sec. Left: Unable to grasp the pegs for a horizontal position.  SENSATION: Intact  EDEMA: N/A   COGNITION: Overall cognitive status: WNL   VISION: Subjective report: Wears glasses, No changes in vision.  PERCEPTION: Intact  TODAY'S TREATMENT:                                                                                                                              DATE:  06/28/23  Moist heat for Bilateral shoulder shoulders, and bilateral hands for muscle relaxation used simultaneous to passive stretching noted below  Therapeutic Ex.:  -Pt. tolerated PROM followed by AROM bilateral wrist extension, PROM for bilateral digit MP, PIP, and DIP flexion, and extension, thumb abduction.  -Lateral, and 3pt. Pinch strengthening using yellow level resistive clip to prepare the hand for using a reacher. -Pt. Worked on BUE strengthening, and reciprocal motion using the UBE while seated for 8 min.  with min-moderate resistance.   Therapeutic Activities:  -facilitated functional reaching in all planes with the right, and left hands for cones. -facilitated functional reaching across midline for cone placement -facilitated grasping with the right hand  removing 1" resistive cubes from the pegboard.  Self-care:   -worked on Sports administrator use for the retrieval of items in multiple, and from multiple surfaces -The reacher was modified to block the trigger handle from fully retracting to fully open the reacher. Pt. Was able to initiate closing the reacher with the trigger handle blocked.      PATIENT EDUCATION: Education details: Bilateral hand and shoulder ROM, and reacher use Person educated: Patient Education method: Explanation, Demonstration, and Verbal cues Education comprehension: verbalized understanding and returned demonstration  HOME EXERCISE PROGRAM: Continue to assess HEP needs, and provide as needed.   GOALS: Goals reviewed with patient? Yes  LONG TERM GOALS: Target date: 08/17/2023  3.  Patient will independently use a reacher to pick up items from various surfaces.  Baseline: 05/25/2023:  Pt. is now able to initiate grasping a reacher to open and close the reacher with the right hand, however requires the grasp handle to be repositioned  within the hand. Pt. requires work on using/moving the reacher with the right hand through various planes. 04/04/2023: Pt. continues to work on improving Digit flexion to the [facility] to be able to efficiently handle Adaptive equipment. 01/19/2023: Pt. Is improving with Bilateral digit flexion to the [facility]. Pt. has difficulty firmly holding adaptive devices. 12/06/2022: Pt. continues to present with increased MP, PIP, and DIP digit tightness/stiffness limiting the formulation of bilateral composites fists. 10/27/2022: Pt. presents with increased MP, PIP, and DIP digit tightness/stiffness limiting the formulation of bilateral composites fists during this  progress reporting period limiting. Digit flexion to the [facility]: R:  2nd: 4 cm(3cm), 3rd: 6cm( 6cm), 4th: 6 cm (5cm), 5th: 5cm(5cm), L: 2nd: 8cm(8cm), 3rd: 8cm(6cm), 4th: 8cm(7cm), 5th: 6cm(6cm) 09/20/2022: Improving digit flexion to the [facility]. 09/13/22: improved digit flexion to the [facility]. 07/05/2022: Pt. presents with digit MP, PIP, and DIP extensor tightness limiting her ability to securely grip objects in her bilateral hands.  04/07/2022: Pt. Has improved with right 2nd, and 3rd digit flexion towards the [facility]. Pt. Continues to have difficulty securely holding and applying deodorant.02/03/2022: Pt. Has improved with digit flexion, however, continues to have difficulty securely holding and using deodorant. 12/09/2021: Bilateral hand/digit MP, PIP, and DIP extension tightness limits her ability achieve digit flexion to hold, and apply deodorant. 10/28/2021:  Pt. continues to have difficulty holding the deodorant. 01/12/2021: Pt. Presents with limited digit extension. Pt. Is able to initiate holding deodorant, however is unable to hold it while using it  Goal status: Ongoing   4.  Pt. Will improve bilateral wrist extension in preparation for anticipating, and initiating reaching for objects at the table.  Baseline: 05/25/23: Wrist extension: R: 46(64), L: -8(14) 04/04/23: R: 46(64) L: -10(10)01/19/2023:  R: 46(64) L: 0(20) 12/06/2022: Pt continues to to progress with bilateral wrist extension in preparation fro functional reaching. 10/27/2022: Pt. Is improving with bilateral wrist extension. R: 46(64) L: 0(20) 09/20/2022: limited PROM in the Left wrist extension 2/2 tightness/stiffness. 09/13/22: R: 55(68), L: 0(12) 07/19/2022: Bilateral wrist extension in limited. 07/05/2022: Right: 40(60) Left: 0(12) 04/07/2022: Right: 40(55) Left: 5(18) 02/03/2022: Right: 34(55) Left: 3(4) 12/09/2021: Right  34(55)  Left  3(4) 10/28/2021:  Right 22(38), Left 0(15) 09/14/2021:  Right: 17(35), left 2(15)  Goal status: Progressing/Ongoing  7.  Pt.  Will increase bilateral lateral pinch strength by 2 lbs to be able to securely grasp items during ADLs, and IADL tasks.  Baseline: 01/19/2023: Deferred as the Pinch meter is out for calibration.12/06/2022: Pt. Is able to more securely  hold objects during ADLs/IADLs 10/27/2022: Pt. Is improving with holding items with her bilateral thumbs. (Pinch meter out for calibration) 09/20/2022: lateral pinch continues to be limited 09/13/22: R 6.5# L 4# 07/19/2022: TBD 07/05/2022: TBD 07/2022: NT-Pinch meter out for calibration. 02/03/2022: Right: 6#, Left: 4# 12/09/2021: Right: 6#, Left: 4#  Goal status: Deferred   9.  Pt. will complete plant care with modified independence.  Baseline:12/06/2022: Pt. Is independent with plant care in her husband's room, however is not able to access her plants due to furniture blocking access to them. 10/27/2022: Pt. Is able to water plants that are closer, and within reach. Pt. continues to have difficulty reaching for thorough plant care. 09/20/2022: Pt. Continues to have difficulty reaching for thorough plant care. 09/13/22: continues to report intermittent difficulty 07/19/2022: Pt. Continues to be able to water, and care for some of her plants. Pt. Has more difficulty with plants that are harder to reach. 07/05/2022: pt. Is able to water, and care for some of her plants. Pt. Has more difficulty with plants that are harder to reach. 04/07/2022: Pt. is now able to hold a cup and water her plants.02/03/2022: Pt. has difficulty caring for her plants.  Goal status:Achieved   10.  Pt. will demonstrate adaptive techniques to assist with the efficiency of self-dressing, or morning care tasks.  Baseline: 05/25/2023: Pt. continues to work on improving UE functioning to be able to efficiently use Adaptive equipment.  04/04/2023:  Pt. continues to work on improving UE functioning to be able to efficiently use Adaptive equipment. 01/19/2203: Pt. Is able to donn her jacket in reverse independently. Pt.  requires assist from staff, as Pt. Reports staff have decreased time. 12/06/2022: Pt. requires assist from staff, as Pt. Reports staff have decreased time. 10/27/2022: Pt. is able to assist with initiating UE dressing. Pt. requires assist from personal, and staff care aides. 09/20/2022: Pt. continues to require assist form personal, and staff care aides. 09/13/22: MODA dressing, reports she is often rushed 07/19/2022: Pt. continues to require assist with self-dressing/morning care tasks. 07/05/2022: Continue with goal. 04/07/2022: Pt. Continues to require assist with the efficiency of self-dressing, and morning care tasks.02/03/2022: Pt. requires assist from caregivers 2/2 time limitations during morning care.  Goal status: Ongoing  11.  Pt. will improve BUE strength to improve ADL, and IADL functioning.  Baseline: 05/25/2023: shoulder flexion L 4/5, R 4-/5; shoulder abduction L 4+/5, R 4-/5. elbow flexion B 5/5, elbow extension 4+/5 01/19/2023: L 4/5, R 3+/5  04/04/2023: shoulder flexion L 4/5, R 3+/5; shoulder abduction L 4+/5, R 3+/5. elbow flexion B 5/5, elbow extension 4/5 01/19/2023: L 4/5, R 3+/5 12/06/2022: Continue 10/27/2022: shoulder flexion L 4/5, R 3+/5; shoulder abduction L 4+/5, R 3+/5. elbow flexion B 5/5, elbow extension 4/5 09/20/2022: Pt. Presents with limited BUE strength 09/13/22: shoulder flexion L 4/5, R 3/5; elbow flexion B 5/5, elbow extension 4-/5; shoulder abduction L 4+/5, R 3+/5. 07/19/2022: BUE strength continues to be limited. 07/05/2022: shoulder flexion: right 4-/5, abduction: 3+/5, elbow flexion: right: 5/5, left 5/5, extension: right: 4-/5, left 4-/5, wrist extension: right: 3-/5, left: 2-/5  Goal status: Ongoing  12.  Pt. will improve bilateral thumb radial abduction in order to be able to hold the grab bars while standing with PT  Baseline: 05/25/23: Thumb radial abduction: R: 38(50), L:28(30) 04/04/23: R: 38(48), L:28(30) 01/19/2023: R: 38(48) L: 24(26)12/06/2022: Pt. Continues to  present  with limited thumb abduction, however is improving holding onto the parallel bars.10/27/2022: thumb radial abduction:  R: 30(42), L: 20(24  Goal status: Ongoing  13.  Pt. Will be able to securely hold, and stabilize medication bottles at a tabletop surface when opening, and closing them.  Baseline: 01/19/2023: Pt. Is now able to securely hold medication bottles while opening them, and stabilizes them on surfaces while opening them. 12/06/2022: Pt is improving with securely stabilizing medication bottles. 10/27/2022: Pt. Stabilizes bottles against her torso when attempting to open, and close them   Goal status: Achieved  14: Pt. Will improve bilateral thumb IP flexion to improve active grasping patterns.    Baseline: 05/25/2023: Thumb IP flexion: R: 40(50) L: 30(45) 04/04/2023:  R: 40(50) L: 25(45) 01/19/2023: Right 35(50), Left: 20/45)    Goal status: Ongoing    15. Pt. Will improve bilateral FMC/speed, and dexterity. As evidence by improved scores on the 9 hole peg test.  Baseline: 05/25/2023: 9 Hole pegs test speed/dexterity: Right: 9 pegs placed and removed in 2 min. & 41 sec. Left: Unable to grasp the pegs for a horizontal position. 04/04/23: Pt. was able to consistently grasp the pegs however each one slipped out of her fingers when attempting to place them into the Pegboard. 01/19/2023: 2 pegs placed in 1 min. & 5 sec.  Pt. was able to remove 9 pegs in 17 sec.; Left: 2 pegs placed in 1 min. & 10 sec. Pt. was able to remove 9 pegs in 20 sec.   Goal status: Ongoing  ASSESSMENT:  CLINICAL IMPRESSION:  Pt. Continues to make progress. Pt. was able to reach in multiple planes, and cross midline to retrieve and place cones at the targets. Pt. was able to initiate using the reacher and fully close it with the right hand when the trigger handle is blocked. Pt. continues to tolerate ROM well. Pt. continues to benefit from OT services to work on impoving ROM, and UE strength in order to work towards  increasing bilateral hand grasp on objects, and increasing engagement of the bilateral hands during ADLs, and IADL tasks.     PERFORMANCE DE PERFORMANCE DEFICITS: in functional skills including ADLs, IADLs, coordination, dexterity, ROM, strength, and UE functional use, cognitive skills including , and psychosocial skills including coping strategies and environmental adaptation.   IMPAIRMENTS: are limiting patient from ADLs, IADLs, and leisure.   CO-MORBIDITIES: may have co-morbidities  that affects occupational performance. Patient will benefit from skilled OT to address above impairments and improve overall function.  MODIFICATION OR ASSISTANCE TO COMPLETE EVALUATION: Min-Moderate modification of tasks or assist with assess necessary to complete an evaluation.  OT OCCUPATIONAL PROFILE AND HISTORY: Detailed assessment: Review of records and additional review of physical, cognitive, psychosocial history related to current functional performance.  CLINICAL DECISION MAKING: Moderate - several treatment options, min-mod task modification necessary  REHAB POTENTIAL: Good for stated goals  PLAN:  OT FREQUENCY 2x's a week  OT DURATION: 12 weeks  PLANNED INTERVENTIONS ADL training, A/E training, UE ther. Ex, Manual therapy, neuromuscular re-education, moist heat modality, Paraffin Bath, Splinting, and  Pt./caregiver education   RECOMMENDED OTHER SERVICES: PT  CONSULTED AND AGREED WITH PLAN OF CARE: Patient  PLAN FOR NEXT SESSION: Continue Treatment as per established POC  Duey Ghent, MS, OTR/L   06/29/23, 3:31 PM

## 2023-06-29 NOTE — Therapy (Signed)
 OUTPATIENT PHYSICAL THERAPY NEURO TREATMENT   Patient Name: Sherry [name] MRN: [id number] DOB:05/17/1934, 89 y.o., female Today's Date: 06/29/2023   PCP: Valrie Gehrig, MD REFERRING PROVIDER: Marlynn Singer   PT End of Session - 06/29/23 1127     Visit Number 117    Number of Visits 139    Date for PT Re-Evaluation 09/12/23    Progress Note Due on Visit 120    PT Start Time 1145    PT Stop Time 1217    PT Time Calculation (min) 32 min    Equipment Utilized During Treatment Gait belt    Activity Tolerance Patient tolerated treatment well;No increased pain    Behavior During Therapy WFL for tasks assessed/performed                          Past Medical History:  Diagnosis Date   Acute blood loss anemia    Arthritis    Cancer (HCC)    skin   Central cord syndrome at C6 level of cervical spinal cord (HCC) 11/29/2017   Hypertension    Protein-calorie malnutrition, severe (HCC) 01/24/2018   S/P insertion of IVC (inferior vena caval) filter 01/24/2018   Tetraparesis (HCC)    Past Surgical History:  Procedure Laterality Date   ANTERIOR CERVICAL DECOMP/DISCECTOMY FUSION N/A 11/29/2017   Procedure: Cervical five-six, six-seven Anterior Cervical Decompression Fusion;  Surgeon: Cannon Champion, MD;  Location: [facility] OR;  Service: Neurosurgery;  Laterality: N/A;  Cervical five-six, six-seven Anterior Cervical Decompression Fusion   CATARACT EXTRACTION     FEMUR IM NAIL Left 08/16/2021   Procedure: INTRAMEDULLARY (IM) NAIL FEMORAL;  Surgeon: Marlynn Singer, MD;  Location: [facility] ORS;  Service: Orthopedics;  Laterality: Left;   IR IVC FILTER PLMT / S&I /IMG GUID/MOD SED  12/08/2017   Patient Active Problem List   Diagnosis Date Noted   Age-related osteoporosis without current pathological fracture 07/27/2022   Mild recurrent major depression (HCC) 07/27/2022   Hypertensive heart disease with other congestive heart failure (HCC) 02/14/2022   Chronic indwelling  Foley catheter 02/14/2022   Closed hip fracture (HCC) 08/15/2021   DVT (deep venous thrombosis) (HCC) 08/15/2021   Quadriplegia (HCC) 08/15/2021   Fall 08/15/2021   Constipation due to slow transit 08/31/2018   Trauma 06/05/2018   Neuropathic pain 06/05/2018   Neurogenic bowel 06/05/2018   Vaginal yeast infection 01/30/2018   Healthcare-associated pneumonia 01/25/2018   Chronic allergic rhinitis 01/24/2018   Depression with anxiety 01/24/2018   UTI due to Klebsiella species 01/24/2018   Protein-calorie malnutrition, severe (HCC) 01/24/2018   S/P insertion of IVC (inferior vena caval) filter 01/24/2018   Tetraparesis (HCC) 01/20/2018   Neurogenic bladder 01/20/2018   Reactive depression    Benign essential HTN    Acute postoperative anemia due to expected blood loss    Central cord syndrome at C6 level of cervical spinal cord (HCC) 11/29/2017   Allergy to alpha-gal 11/25/2016   SCC (squamous cell carcinoma) 04/08/2014    ONSET DATE: 08/15/2021 (fall with B hip fx); Initial injury was in 2019- quadraplegia due to central cord syndrome of C6 secondary to neck fracture.   REFERRING DIAG: Bilateral Hip fx; Left Open- s/p Left IM nail femoral on 08/16/2021; Right closed  THERAPY DIAG:  Muscle weakness (generalized)  Other lack of coordination  Central cord syndrome, sequela (HCC)  Other abnormalities of gait and mobility  Difficulty in walking, not elsewhere classified  Abnormality of gait and mobility  Unsteadiness on feet  Closed fracture of both hips, initial encounter [facility])  Rationale for Evaluation and Treatment Rehabilitation  SUBJECTIVE:   SUBJECTIVE STATEMENT:    Patient reports feeling okay- No new issues.         Pt accompanied by: Paid caregiver-   PERTINENT HISTORY: Sherry [name] is an 87yoF who experienced a fall at home on 08/15/2021 with hip fracture, s/p Left hip ORIF. PMH: quadriplegia due to central cord syndrome of C6 secondary to neck fracture,  HTN, depression with anxiety, neurogenic bladder, bilateral DVT on Xarelto  (s/p of IVC filter placement). Prior history of significant PT/OT with improvement of function. Pt has full use of shoulder/elbows, limited use of hands, adaptive self feeding sucessful.     PAIN:  None current but does endorse some left thigh pain intermittent  OBJECTIVE:   TODAY'S TREATMENT:   06/29/23     Therapeutic Activities: dynamic therapeutic activities designed to achieve improved functional performance -all activities performed in chair today.  Stand pivot transfer training- Patient able to scoot out to edge of seat (by reclining herself and scooting trunk forward toward edge of seat. She was able to anterior trunk lean but required assist to keep RLE and LLE flexed - blocking feet and knees after turning feet 1/4 turn for safe transfer- Max assist of 1 to stand and then pivot and safety sit at [facility]. Patient denied any pain- able to assist at least 25% and stand for a few seconds attempting to move her Right LE    Attempted lateral scoot at [facility]- Patient unable - poor ability to ext left wrist and decrease grip strength.   Dynamic reaching with Alt UE- touching authors hand at varying degrees of ROM and forward lean x several min with 1 seated rest break.    Lateral Trunk shift (forearms to mat table)  x 15 reps   Ant/post weight shift (Forward/backward) trunk lean x 15 reps  Manual resistive Hip ext (at hip/knee 90/90) to simulate pushing through her feet to stand up x 5 sec hold x 8 reps.   Active then AAROM- hip march x 10 reps each LE   Active to AAROM hip abd/add at edge of mat to assist with moving legs laterally for lateral scoot- x 15 reps each LE.        PATIENT EDUCATION: Education details: Exercise technique Patient verbalized understanding.   HOME EXERCISE PROGRAM: No changes at this time  Access Code: [facility] URL: [email or web address] Date: 11/23/2021 Prepared  by: Marina  Moser Exercises - Seated Gluteal Sets - 2 x daily - 7 x weekly - 2 sets - 10 reps - 5 hold - Seated Quad Set - 2 x daily - 7 x weekly - 2 sets - 10 reps - 5 hold - Seated Long Arc Quad - 2 x daily - 7 x weekly - 2 sets - 10 reps - 5 hold - Seated March - 2 x daily - 7 x weekly - 2 sets - 10 reps - 5 hold - Seated Hip Abduction - 2 x daily - 7 x weekly - 2 sets - 10 reps - 5 hold - Seated Shoulder Shrugs - 2 x daily - 7 x weekly - 2 sets - 10 reps - 5 hold - Wheelchair Pressure Relief - 2 x daily - 7 x weekly - 2 sets - 10 reps - 5 hold     GOALS: Goals reviewed with patient? Yes  SHORT TERM GOALS: Target date: 02/22/2022  Pt will  be independent with initial UE strengthening HEP in order to improve strength and balance in order to decrease fall risk and improve function at home and work. Baseline: 10/19/2021- patient with no formal UE HEP; 12/02/2021= Patient verbalized knowledge of HEP including use of theraband for UE strengthening.  Goal status: GOAL MET  LONG TERM GOALS: Target date: 09/12/2023   Pt will be independent with final for UE/LE HEP in order to improve strength and balance in order to decrease fall risk and improve function at home and work. Baseline: Patient is currently BLE NWB and unable to participate in HEP. Has order for UE strengthening. 01/11/2022- Patient now able to participate in LE strengthening although NWB still- good understanding for some basic exercises. Will keep goal active to incorporate progressive LE strengthening exercises. 04/07/2022= Patient still participating in progressive LE seated HEP and has no questions at this time.  Goal status: MET  2.  Pt will improve FOTO to target score of 35  to display perceived improvements in ability to complete ADL's.  Baseline: 10/19/2021= 12; 12/02/2021= 17; 01/11/2022=17; 04/07/2022= 17; 07/05/2022=17; 10/06/2022- outcome measure not appropriate as patient in non-ambulatory and not consistently standing. Goal status: Goal  not appropriate  3.  Pt will increase strength of B UE  by at least 1/2 MMT grade in order to demonstrate improvement in strength and function  Baseline: patient has range of 2-/5 to 4/5 BUE Strength; 12/02/2021= 4/5 except with wrist ext. 01/11/2022= 4/5 B UE strength expept for wrist Goal status: Goal revised-no longer appropriate- working with OT on all UE strengthening.   4. Pt. Will increase strength of RLE by at least 1/2 MMT grade in order to demonstrate improvement in Standing/transfers.  Baseline: 2-/5 Right hip flex/knee ext/flex; 04/07/2022= 2- with right hip flex/knee ext (lacking 28 deg from zero) 4/8= Patient able to ext right knee lacking 18 deg from zero. 07/05/2022=Left knee approx 8 deg from zero and right knee = 23 deg from zero; 12/29/2022 - Left LE = full ROM 4-/5 and right knee ext= 23 deg from zero= 2+/5 (gravity minimized); 03/28/2023= Left knee ext=  4/5 and right knee ext= 3-/5 (able to move lower leg through incomplete ROM -lacking 20 deg from full ROM against gravity); 05/23/2023= right knee ext= 3-/5 (able to move lower leg through incomplete ROM -lacking 17 deg from full ROM against gravity); 06/20/2023= Right knee ext= lacking 20 deg from zero  Goal status: Progressing  5. Pt. Will demo ability to stand pivot transfer with max assist for improved functional mobility and less dependent need on mechanical device.   Baseline: Dependent on hoyer lift for all transfers; 04/07/2022- Unable to test secondary to patient with recent UTI and right hand procedure - will attempt to reassess next visit. ; 04/12/22: Unable to successfully stand due to feet plates on loaned power w/c; 05/10/2022- Patient still having to use rental chair and has not received her original power w/c back to practice SPT. 07/04/2021= Patient just received new power w/c and attempted standing from 1st time today- Patient able to stand with max assist today from w/c and did not attempt to perform pivot transfer- difficulty  with static standing and placing weight on right LE. 10/06/2022- Not assessed today- patient needs new AFO prior to attempting transfers; 12/29/2022= Patient has not attempted SPT yet - still trying to improve LE strength enough to stand well without knee buckling with weight shifting. 03/28/2023- not attempted today due to ill fitting brace but patient last performed  SPT on 01/10/23 and was able to perform with Max A with feet pre-positioned to angle toward direction of transfer; 05/23/2023- Not tested today but on last visit- patient able to perform SPT with max assist yet concern for twisting knee as she was unable to turn/pivot foot .  06/20/2023- deferred to next session due to patient fatigued with static standing today.  Goal status: ONGOING  6. Pt. Will demonstrate improved functional LE strength as seen by ability to stand > 2 min for improved transfer ability and pregait abilities.   Baseline: not assessed today due to recent dx: UTI; 04/07/2022- Unable to test secondary to patient with recent UTI and right hand procedure - will attempt to reassess next visit; 04/12/22: Unable to successfully stand due to feet plates on loaned power w/c 05/10/2022- Patient still having to use rental chair and has not received her original power w/c back to practice Standing. 07/05/2022- Patient able to stand with max assist at trunk today for 2 min 3 sec. Will keep goal active to ensure consistency. 10/06/2022= Unable to assess today- patient with some recent blood pressure issues and needs new AFO. 11/24/2022= Patient demonstrated multiple rounds of standing in // bars- holding up to 1:45 min today but has been able to exhibit 2 min in recent past- working on consistency. 12/29/2022= 2 min 40 sec with patient demonstrating improved overall posture and ability to contract glutes and quads with just min assist at trunk today.   Goal status: MET  7. Pt. Will demonstrate improved functional LE strength as seen by ability to stand > 5  min for improved transfer ability and pregait abilities. Baseline: 12/29/2022= 2 min 40 sec with patient demonstrating improved overall posture and ability to contract glutes and quads with just min assist at trunk today. 03/28/2023= Attempted 2 trials today - at best 2 min 48 sec today but during cert- did stand for 3 min 44 sec. 06/20/2023= Patient able to stand 2x (45 sec and 1 min 12 sec)- max assist yet no slippage of LLE.  Goal Status: Ongoing- Patient has only been standing 2x since received new AFO shoe.  8. Patient will perform sit to stand transfer with moderate assist consistently > 75% of time for improved transfer ability and less dependence on caregiver.   Baseline: 12/29/2022- Patient currently max assist level assistance with all sit to stand activities. 03/28/2023= Patient is still majority of max assist to stand yet able to pull up from wheelchair smoother in one motion. 06/20/2023- Max Assist but has only most recently resumed standing since last week so goal still appropriate for new cert  Goal status: ONGOING   ASSESSMENT:  CLINICAL IMPRESSION:   Patient was able to perform safe transfer- still max assist to stand and unable to pivot foot on her own yet. Worked on seated trunk postural activities to assist with UE reaching and no specific difficulty- patient able to reach forward to both sides well without loss of balance.  Pt will continue to benefit from skilled PT services to optimize independence and reduced caregiver support.   OBJECTIVE IMPAIRMENTS Abnormal gait, decreased activity tolerance, decreased balance, decreased coordination, decreased endurance, decreased mobility, difficulty walking, decreased ROM, decreased strength, hypomobility, impaired flexibility, impaired UE functional use, postural dysfunction, and pain.   ACTIVITY LIMITATIONS carrying, lifting, bending, sitting, standing, squatting, sleeping, stairs, transfers, bed mobility, continence, bathing, toileting,  dressing, self feeding, reach over head, hygiene/grooming, and caring for others  PARTICIPATION LIMITATIONS: meal prep, cleaning, laundry, medication management,  personal finances, interpersonal relationship, driving, shopping, community activity, and yard work  PERSONAL FACTORS Age, Time since onset of injury/illness/exacerbation, and 1-2 comorbidities: HTN, cervical Sx are also affecting patient's functional outcome.   REHAB POTENTIAL: Good  CLINICAL DECISION MAKING: Evolving/moderate complexity  EVALUATION COMPLEXITY: Moderate  PLAN: PT FREQUENCY: 1-2x/week  PT DURATION: 12 weeks  PLANNED INTERVENTIONS: Therapeutic exercises, Therapeutic activity, Neuromuscular re-education, Balance training, Gait training, Patient/Family education, Self Care, Joint mobilization, DME instructions, Dry Needling, Electrical stimulation, Wheelchair mobility training, Spinal mobilization, Cryotherapy, Moist heat, and Manual therapy  PLAN FOR NEXT SESSION:   Continue with LE and core strength training  Sit to stand and static standing SPT as able.     12:30 PM, 06/29/23  12:30 PM, 06/29/23 Ossie Blend, PT Physical Therapist - [facility]  Outpatient Physical Therapy- Main Campus [phone]

## 2023-07-04 ENCOUNTER — Ambulatory Visit: Payer: Medicare HMO

## 2023-07-04 ENCOUNTER — Ambulatory Visit: Payer: Medicare HMO | Attending: Internal Medicine | Admitting: Occupational Therapy

## 2023-07-04 DIAGNOSIS — S14129S Central cord syndrome at unspecified level of cervical spinal cord, sequela: Secondary | ICD-10-CM | POA: Diagnosis present

## 2023-07-04 DIAGNOSIS — R2689 Other abnormalities of gait and mobility: Secondary | ICD-10-CM | POA: Diagnosis present

## 2023-07-04 DIAGNOSIS — R262 Difficulty in walking, not elsewhere classified: Secondary | ICD-10-CM | POA: Diagnosis present

## 2023-07-04 DIAGNOSIS — R269 Unspecified abnormalities of gait and mobility: Secondary | ICD-10-CM

## 2023-07-04 DIAGNOSIS — R2681 Unsteadiness on feet: Secondary | ICD-10-CM

## 2023-07-04 DIAGNOSIS — R278 Other lack of coordination: Secondary | ICD-10-CM | POA: Insufficient documentation

## 2023-07-04 DIAGNOSIS — R296 Repeated falls: Secondary | ICD-10-CM | POA: Insufficient documentation

## 2023-07-04 DIAGNOSIS — S72002A Fracture of unspecified part of neck of left femur, initial encounter for closed fracture: Secondary | ICD-10-CM | POA: Insufficient documentation

## 2023-07-04 DIAGNOSIS — M6281 Muscle weakness (generalized): Secondary | ICD-10-CM

## 2023-07-04 DIAGNOSIS — S72001A Fracture of unspecified part of neck of right femur, initial encounter for closed fracture: Secondary | ICD-10-CM | POA: Insufficient documentation

## 2023-07-04 NOTE — Therapy (Signed)
 OUTPATIENT PHYSICAL THERAPY NEURO TREATMENT   Patient Name: Sherry [name] MRN: [id number] DOB:1934/06/19, 89 y.o., female Today's Date: 07/04/2023   PCP: Sherry Gehrig, MD REFERRING PROVIDER: Marlynn [name]   PT End of Session - 07/04/23 1257     Visit Number 118    Number of Visits 139    Date for PT Re-Evaluation 09/12/23    Progress Note Due on Visit 120    PT Start Time 1145    PT Stop Time 1220    PT Time Calculation (min) 35 min    Equipment Utilized During Treatment Gait belt    Activity Tolerance Patient tolerated treatment well;No increased pain    Behavior During Therapy WFL for tasks assessed/performed                           Past Medical History:  Diagnosis Date   Acute blood loss anemia    Arthritis    Cancer (HCC)    skin   Central cord syndrome at C6 level of cervical spinal cord (HCC) 11/29/2017   Hypertension    Protein-calorie malnutrition, severe (HCC) 01/24/2018   S/P insertion of IVC (inferior vena caval) filter 01/24/2018   Tetraparesis (HCC)    Past Surgical History:  Procedure Laterality Date   ANTERIOR CERVICAL DECOMP/DISCECTOMY FUSION N/A 11/29/2017   Procedure: Cervical five-six, six-seven Anterior Cervical Decompression Fusion;  Surgeon: Sherry Champion, MD;  Location: [facility] OR;  Service: Neurosurgery;  Laterality: N/A;  Cervical five-six, six-seven Anterior Cervical Decompression Fusion   CATARACT EXTRACTION     FEMUR IM NAIL Left 08/16/2021   Procedure: INTRAMEDULLARY (IM) NAIL FEMORAL;  Surgeon: Sherry Singer, MD;  Location: [facility] ORS;  Service: Orthopedics;  Laterality: Left;   IR IVC FILTER PLMT / S&I /IMG GUID/MOD SED  12/08/2017   Patient Active Problem List   Diagnosis Date Noted   Age-related osteoporosis without current pathological fracture 07/27/2022   Mild recurrent major depression (HCC) 07/27/2022   Hypertensive heart disease with other congestive heart failure (HCC) 02/14/2022   Chronic indwelling  Foley catheter 02/14/2022   Closed hip fracture (HCC) 08/15/2021   DVT (deep venous thrombosis) (HCC) 08/15/2021   Quadriplegia (HCC) 08/15/2021   Fall 08/15/2021   Constipation due to slow transit 08/31/2018   Trauma 06/05/2018   Neuropathic pain 06/05/2018   Neurogenic bowel 06/05/2018   Vaginal yeast infection 01/30/2018   Healthcare-associated pneumonia 01/25/2018   Chronic allergic rhinitis 01/24/2018   Depression with anxiety 01/24/2018   UTI due to Klebsiella species 01/24/2018   Protein-calorie malnutrition, severe (HCC) 01/24/2018   S/P insertion of IVC (inferior vena caval) filter 01/24/2018   Tetraparesis (HCC) 01/20/2018   Neurogenic bladder 01/20/2018   Reactive depression    Benign essential HTN    Acute postoperative anemia due to expected blood loss    Central cord syndrome at C6 level of cervical spinal cord (HCC) 11/29/2017   Allergy to alpha-gal 11/25/2016   SCC (squamous cell carcinoma) 04/08/2014    ONSET DATE: 08/15/2021 (fall with B hip fx); Initial injury was in 2019- quadraplegia due to central cord syndrome of C6 secondary to neck fracture.   REFERRING DIAG: Bilateral Hip fx; Left Open- s/p Left IM nail femoral on 08/16/2021; Right closed  THERAPY DIAG:  Muscle weakness (generalized)  Central cord syndrome, sequela (HCC)  Other lack of coordination  Other abnormalities of gait and mobility  Difficulty in walking, not elsewhere classified  Abnormality of gait and  mobility  Unsteadiness on feet  Closed fracture of both hips, initial encounter [facility])  Rationale for Evaluation and Treatment Rehabilitation  SUBJECTIVE:   SUBJECTIVE STATEMENT:    Patient reports had a good weekend but having some issues with her w/c and brought in her back up today.          Pt accompanied by: Paid caregiver-   PERTINENT HISTORY: Sherry [name] is an 89yoF who experienced a fall at home on 08/15/2021 with hip fracture, s/p Left hip ORIF. PMH: quadriplegia due  to central cord syndrome of C6 secondary to neck fracture, HTN, depression with anxiety, neurogenic bladder, bilateral DVT on Xarelto  (s/p of IVC filter placement). Prior history of significant PT/OT with improvement of function. Pt has full use of shoulder/elbows, limited use of hands, adaptive self feeding sucessful.     PAIN:  None current but does endorse some left thigh pain intermittent  OBJECTIVE:   TODAY'S TREATMENT:   07/04/23     Therapeutic Activities: dynamic therapeutic activities designed to achieve improved functional performance -all activities performed in chair today.  Sit to stand - Max assist with feet position and patient able to position her hands and stand on 3 count. Max assist to stand today with trunk support x 5  Static standing in // bars - 3 attempts - Initally had hands too far forward and required to sit after 30 sec to regroup. Then she was able to perform 2 more trials -2 min 25 sec and 1 min 47 sec focusing on static Standing- WB through her LE with min UE Support- some assist to anterior lean with hips for erect posture. Once assisted into position- patient was able to stand with very min assist and providing knees slightly block to avoid excessive knee valgus and hyperextension.      THEREX-  AROM- hip march 2x 10 reps each LE  AROM- Knee ext 2 x 10 reps each LE         PATIENT EDUCATION: Education details: Exercise technique Patient verbalized understanding.   HOME EXERCISE PROGRAM: No changes at this time  Access Code: [facility] URL: [email or web address] Date: 11/23/2021 Prepared by: Marina  Moser Exercises - Seated Gluteal Sets - 2 x daily - 7 x weekly - 2 sets - 10 reps - 5 hold - Seated Quad Set - 2 x daily - 7 x weekly - 2 sets - 10 reps - 5 hold - Seated Long Arc Quad - 2 x daily - 7 x weekly - 2 sets - 10 reps - 5 hold - Seated March - 2 x daily - 7 x weekly - 2 sets - 10 reps - 5 hold - Seated Hip Abduction - 2 x daily -  7 x weekly - 2 sets - 10 reps - 5 hold - Seated Shoulder Shrugs - 2 x daily - 7 x weekly - 2 sets - 10 reps - 5 hold - Wheelchair Pressure Relief - 2 x daily - 7 x weekly - 2 sets - 10 reps - 5 hold     GOALS: Goals reviewed with patient? Yes  SHORT TERM GOALS: Target date: 02/22/2022  Pt will be independent with initial UE strengthening HEP in order to improve strength and balance in order to decrease fall risk and improve function at home and work. Baseline: 10/19/2021- patient with no formal UE HEP; 12/02/2021= Patient verbalized knowledge of HEP including use of theraband for UE strengthening.  Goal status: GOAL MET  LONG TERM GOALS: Target date:  09/12/2023   Pt will be independent with final for UE/LE HEP in order to improve strength and balance in order to decrease fall risk and improve function at home and work. Baseline: Patient is currently BLE NWB and unable to participate in HEP. Has order for UE strengthening. 01/11/2022- Patient now able to participate in LE strengthening although NWB still- good understanding for some basic exercises. Will keep goal active to incorporate progressive LE strengthening exercises. 04/07/2022= Patient still participating in progressive LE seated HEP and has no questions at this time.  Goal status: MET  2.  Pt will improve FOTO to target score of 35  to display perceived improvements in ability to complete ADL's.  Baseline: 10/19/2021= 12; 12/02/2021= 17; 01/11/2022=17; 04/07/2022= 17; 07/05/2022=17; 10/06/2022- outcome measure not appropriate as patient in non-ambulatory and not consistently standing. Goal status: Goal not appropriate  3.  Pt will increase strength of B UE  by at least 1/2 MMT grade in order to demonstrate improvement in strength and function  Baseline: patient has range of 2-/5 to 4/5 BUE Strength; 12/02/2021= 4/5 except with wrist ext. 01/11/2022= 4/5 B UE strength expept for wrist Goal status: Goal revised-no longer appropriate- working with OT  on all UE strengthening.   4. Pt. Will increase strength of RLE by at least 1/2 MMT grade in order to demonstrate improvement in Standing/transfers.  Baseline: 2-/5 Right hip flex/knee ext/flex; 04/07/2022= 2- with right hip flex/knee ext (lacking 28 deg from zero) 4/8= Patient able to ext right knee lacking 18 deg from zero. 07/05/2022=Left knee approx 8 deg from zero and right knee = 23 deg from zero; 12/29/2022 - Left LE = full ROM 4-/5 and right knee ext= 23 deg from zero= 2+/5 (gravity minimized); 03/28/2023= Left knee ext=  4/5 and right knee ext= 3-/5 (able to move lower leg through incomplete ROM -lacking 20 deg from full ROM against gravity); 05/23/2023= right knee ext= 3-/5 (able to move lower leg through incomplete ROM -lacking 17 deg from full ROM against gravity); 06/20/2023= Right knee ext= lacking 20 deg from zero  Goal status: Progressing  5. Pt. Will demo ability to stand pivot transfer with max assist for improved functional mobility and less dependent need on mechanical device.   Baseline: Dependent on hoyer lift for all transfers; 04/07/2022- Unable to test secondary to patient with recent UTI and right hand procedure - will attempt to reassess next visit. ; 04/12/22: Unable to successfully stand due to feet plates on loaned power w/c; 05/10/2022- Patient still having to use rental chair and has not received her original power w/c back to practice SPT. 07/04/2021= Patient just received new power w/c and attempted standing from 1st time today- Patient able to stand with max assist today from w/c and did not attempt to perform pivot transfer- difficulty with static standing and placing weight on right LE. 10/06/2022- Not assessed today- patient needs new AFO prior to attempting transfers; 12/29/2022= Patient has not attempted SPT yet - still trying to improve LE strength enough to stand well without knee buckling with weight shifting. 03/28/2023- not attempted today due to ill fitting brace but patient last  performed SPT on 01/10/23 and was able to perform with Max A with feet pre-positioned to angle toward direction of transfer; 05/23/2023- Not tested today but on last visit- patient able to perform SPT with max assist yet concern for twisting knee as she was unable to turn/pivot foot .  06/20/2023- deferred to next session due to patient fatigued  with static standing today.  Goal status: ONGOING  6. Pt. Will demonstrate improved functional LE strength as seen by ability to stand > 2 min for improved transfer ability and pregait abilities.   Baseline: not assessed today due to recent dx: UTI; 04/07/2022- Unable to test secondary to patient with recent UTI and right hand procedure - will attempt to reassess next visit; 04/12/22: Unable to successfully stand due to feet plates on loaned power w/c 05/10/2022- Patient still having to use rental chair and has not received her original power w/c back to practice Standing. 07/05/2022- Patient able to stand with max assist at trunk today for 2 min 3 sec. Will keep goal active to ensure consistency. 10/06/2022= Unable to assess today- patient with some recent blood pressure issues and needs new AFO. 11/24/2022= Patient demonstrated multiple rounds of standing in // bars- holding up to 1:45 min today but has been able to exhibit 2 min in recent past- working on consistency. 12/29/2022= 2 min 40 sec with patient demonstrating improved overall posture and ability to contract glutes and quads with just min assist at trunk today.   Goal status: MET  7. Pt. Will demonstrate improved functional LE strength as seen by ability to stand > 5 min for improved transfer ability and pregait abilities. Baseline: 12/29/2022= 2 min 40 sec with patient demonstrating improved overall posture and ability to contract glutes and quads with just min assist at trunk today. 03/28/2023= Attempted 2 trials today - at best 2 min 48 sec today but during cert- did stand for 3 min 44 sec. 06/20/2023= Patient able  to stand 2x (45 sec and 1 min 12 sec)- max assist yet no slippage of LLE.  Goal Status: Ongoing- Patient has only been standing 2x since received new AFO shoe.  8. Patient will perform sit to stand transfer with moderate assist consistently > 75% of time for improved transfer ability and less dependence on caregiver.   Baseline: 12/29/2022- Patient currently max assist level assistance with all sit to stand activities. 03/28/2023= Patient is still majority of max assist to stand yet able to pull up from wheelchair smoother in one motion. 06/20/2023- Max Assist but has only most recently resumed standing since last week so goal still appropriate for new cert  Goal status: ONGOING   ASSESSMENT:  CLINICAL IMPRESSION:   Patient performed her best quality stand since obtaining new brace. She was able to stand more erect and less overall physical support without excessive left knee hyperextension. She was exhausted after standing but pleased with her abilities today.  Pt will continue to benefit from skilled PT services to optimize independence and reduced caregiver support.   OBJECTIVE IMPAIRMENTS Abnormal gait, decreased activity tolerance, decreased balance, decreased coordination, decreased endurance, decreased mobility, difficulty walking, decreased ROM, decreased strength, hypomobility, impaired flexibility, impaired UE functional use, postural dysfunction, and pain.   ACTIVITY LIMITATIONS carrying, lifting, bending, sitting, standing, squatting, sleeping, stairs, transfers, bed mobility, continence, bathing, toileting, dressing, self feeding, reach over head, hygiene/grooming, and caring for others  PARTICIPATION LIMITATIONS: meal prep, cleaning, laundry, medication management, personal finances, interpersonal relationship, driving, shopping, community activity, and yard work  PERSONAL FACTORS Age, Time since onset of injury/illness/exacerbation, and 1-2 comorbidities: HTN, cervical Sx are also  affecting patient's functional outcome.   REHAB POTENTIAL: Good  CLINICAL DECISION MAKING: Evolving/moderate complexity  EVALUATION COMPLEXITY: Moderate  PLAN: PT FREQUENCY: 1-2x/week  PT DURATION: 12 weeks  PLANNED INTERVENTIONS: Therapeutic exercises, Therapeutic activity, Neuromuscular re-education, Balance training, Gait training,  Patient/Family education, Self Care, Joint mobilization, DME instructions, Dry Needling, Electrical stimulation, Wheelchair mobility training, Spinal mobilization, Cryotherapy, Moist heat, and Manual therapy  PLAN FOR NEXT SESSION:   Continue with LE and core strength training  Sit to stand and static standing SPT as able.     1:32 PM, 07/04/23  1:32 PM, 07/04/23 Ossie Blend, PT Physical Therapist - [facility]  Outpatient Physical Therapy- Main Campus [phone]

## 2023-07-04 NOTE — Therapy (Addendum)
 Occupational Therapy  Neuro Treatment Note  Patient Name: Sherry [name] MRN: [id number] DOB:1934/11/06, 89 y.o., female Today's Date: 11/29/2022  PCP:  Waunita Haff PROVIDER: Valrie Gehrig, MD  END OF SESSION:   OT End of Session - 07/04/23 1315     Visit Number 128    Number of Visits 192    Date for OT Re-Evaluation 08/17/23    OT Start Time 1105    OT Stop Time 1145    OT Time Calculation (min) 40 min    Activity Tolerance Patient tolerated treatment well    Behavior During Therapy WFL for tasks assessed/performed                         Past Medical History:  Diagnosis Date   Acute blood loss anemia    Arthritis    Cancer (HCC)    skin   Central cord syndrome at C6 level of cervical spinal cord (HCC) 11/29/2017   Hypertension    Protein-calorie malnutrition, severe (HCC) 01/24/2018   S/P insertion of IVC (inferior vena caval) filter 01/24/2018   Tetraparesis (HCC)    Past Surgical History:  Procedure Laterality Date   ANTERIOR CERVICAL DECOMP/DISCECTOMY FUSION N/A 11/29/2017   Procedure: Cervical five-six, six-seven Anterior Cervical Decompression Fusion;  Surgeon: Cannon Champion, MD;  Location: [facility] OR;  Service: Neurosurgery;  Laterality: N/A;  Cervical five-six, six-seven Anterior Cervical Decompression Fusion   CATARACT EXTRACTION     FEMUR IM NAIL Left 08/16/2021   Procedure: INTRAMEDULLARY (IM) NAIL FEMORAL;  Surgeon: Marlynn Singer, MD;  Location: [facility] ORS;  Service: Orthopedics;  Laterality: Left;   IR IVC FILTER PLMT / S&I /IMG GUID/MOD SED  12/08/2017   Patient Active Problem List   Diagnosis Date Noted   Age-related osteoporosis without current pathological fracture 07/27/2022   Mild recurrent major depression (HCC) 07/27/2022   Hypertensive heart disease with other congestive heart failure (HCC) 02/14/2022   Chronic indwelling Foley catheter 02/14/2022   Closed hip fracture (HCC) 08/15/2021   DVT (deep venous thrombosis) (HCC)  08/15/2021   Quadriplegia (HCC) 08/15/2021   Fall 08/15/2021   Constipation due to slow transit 08/31/2018   Trauma 06/05/2018   Neuropathic pain 06/05/2018   Neurogenic bowel 06/05/2018   Vaginal yeast infection 01/30/2018   Healthcare-associated pneumonia 01/25/2018   Chronic allergic rhinitis 01/24/2018   Depression with anxiety 01/24/2018   UTI due to Klebsiella species 01/24/2018   Protein-calorie malnutrition, severe (HCC) 01/24/2018   S/P insertion of IVC (inferior vena caval) filter 01/24/2018   Tetraparesis (HCC) 01/20/2018   Neurogenic bladder 01/20/2018   Reactive depression    Benign essential HTN    Acute postoperative anemia due to expected blood loss    Central cord syndrome at C6 level of cervical spinal cord (HCC) 11/29/2017   Allergy to alpha-gal 11/25/2016   SCC (squamous cell carcinoma) 04/08/2014   ONSET DATE: 11/29/2017  REFERRING DIAG: Central Cord Syndrome at C6 of the Cervical Spinal Cord, Fall with Bilateral Closed Hip Fractures, with ORIF repair with Intramedullary Nailing of the Left Hip Fracture.   THERAPY DIAG:  Muscle weakness (generalized)  Other lack of coordination  Rationale for Evaluation and Treatment: Rehabilitation  SUBJECTIVE:  SUBJECTIVE STATEMENT:  Pt. Reports doing okay today.   Pt accompanied by: self, Personal Care aide  PERTINENT HISTORY: Central Cord Syndrome at C6 of the Cervical Spinal Cord, Fall with Bilateral Closed Hip Fractures, with ORIF repair with Intramedullary Nailing of the Left  Hip Fracture.   PRECAUTIONS: None  WEIGHT BEARING RESTRICTIONS: No  PAIN:  Are you having pain? No pain   LIVING ENVIRONMENT: Lives with: [facility] Stairs: No Has following equipment at home: Wheelchair (power)  PLOF: Independent   PATIENT GOALS:  See below for established goals  OBJECTIVE:  Note: Objective measures were completed at Evaluation unless otherwise noted.  HAND DOMINANCE:  Right  ADLs: Caregiver assist with ADLs, and [facility]  MOBILITY STATUS: Uses a power w/c  ACTIVITY TOLERANCE: Activity tolerance: Fair  FUNCTIONAL OUTCOME MEASURES:  Measurements:   12/09/2021:   Shoulder flexion: R: 120(136), L: 138(150) Shoulder abduction: R: 108(120), L: 110(120) Elbow: R: 0-148, L: 0-146 Wrist flexion: R: 55(62), L: 78 Writ extension: R: 34(55), L: 3(4) RD: R: 12(20), L: 16(26) UD: R: 8(24), L: 6(18) Thumb radial abduction: R: 11(20), L: 8(20) Digit flexion to the [facility]: R:  2nd: 5cm(3.5cm), 3rd: 7.5 cm(5cm), 4th: 7cm(3cm), 5th: 6cm(5.5cm) L: 2nd: 8cm(8cm), 3rd: 8cm(8cm), 4th: 7.5cm(7cm), 5th: 7cm(7cm)   02/03/2022:   Shoulder flexion: R: 122(138), L: 148(154) Shoulder abduction: R: 109(125), L: 125(125 Elbow: R: 0-148, L: 0-146 Wrist flexion: R: 60(68), L: 79 Writ extension: R: 40(55), L: 3(10) RD: R: 14(24), L: 20(28) UD: R: 8(24), L: 6(18) Thumb radial abduction: R: 20(25), L: 8(20) Digit flexion to the [facility]: R:  2nd: 4.5cm(3cm), 3rd: 6.5cm(4.5cm), 4th: 6.5 cm(3cm), 5th: 4.5cm(5cm) L: 2nd: 8cm(8cm), 3rd: 8cm(7cm), 4th: 7.5cm(7cm), 5th: 6.5cm(6.5cm)   04/07/2022:   Shoulder flexion: R: 125(150), L: 160(160) Shoulder abduction: R: 109(125), L: 125(125 Elbow: R: 0-148, L: 0-146 Wrist flexion: R: 60(68), L: 79 Writ extension: R: 40(55), L: 5(18) RD: R: 14(24), L: 20(28) UD: R: 18(24), L: 8(18) Thumb radial abduction: R: 20(25), L: 8(20) Digit flexion to the [facility]: R:  2nd: 4.5 cm(4 cm), 3rd: 6.5cm(3cm), 4th: 7 cm (5cm), 5th: 5cm(5cm) L: 2nd: 8cm(8cm), 3rd: 8cm(7cm), 4th: 7.5cm(7cm), 5th: 7cm(7cm)   07/05/2022:   Shoulder flexion: R: 125(154), L: 160(160) Shoulder abduction: R: 95(130), L: 143(143) Elbow: R: 0-148, L: 0-146 Wrist flexion: R: 60(68), L: 79 Writ extension: R: 40(60), L: 0(12) RD: R: 10(24), L: 12(20) UD: R: 16(24), L: 8(16) Thumb radial abduction: R: 20(25), L: 8(20) Digit flexion to the [facility]: R:  2nd: 4 cm(4cm), 3rd:  6.5cm( 3.5cm), 4th: 7 cm (5cm), 5th: 6cm(5cm) L: 2nd: 8cm(8cm), 3rd: 8cm(7cm), 4th: 8cm(7cm), 5th: 7cm(7cm)   09/13/2022:   Shoulder flexion: R: 125(154), L: 160(160) Shoulder abduction: R: 100(130), L: 143(143) Elbow: R: 0-150, L: 0-150 Wrist flexion: R: 55(68), L: 79 Writ extension: R: 45(60), L: 0(12) RD: R: 10(24), L: 12(20) UD: R: 16(24), L: 8(16) Thumb radial abduction: R: 20(25), L: 8(20) Digit flexion to the [facility]: R:  2nd: 4 cm(4cm), 3rd: 6cm( 5cm), 4th: 6 cm (4cm), 5th: 5cm(5cm) L: 2nd: 7cm(cm), 3rd: 7cm(6cm), 4th: 7cm(6cm), 5th: 6cm(6cm)   10/27/2022   Shoulder flexion: R: 128(154), L: 160(160) Shoulder abduction: R: 105(130), L: 143(143) Elbow: R: 0-150, L: 0-150 Wrist flexion: R: 55(68), L: 79 Writ extension: R: 46(64), L: 0(20) RD: R: 10(20), L: 18(24) UD: R: 18(28), L: 8(20) Thumb radial abduction: R: 30(42), L:20(24) Digit flexion to the [facility]: R:  2nd: 4 cm(3cm), 3rd: 6cm( 6cm), 4th: 6 cm (5cm), 5th: 5cm(5cm) L: 2nd: 8cm(8cm), 3rd: 8cm(6cm), 4th: 8cm(7cm), 5th: 6cm(6cm)  01/19/2023  Shoulder flexion: R: 131(154), L: 160(160) Shoulder abduction: R: 110(130), L: 143(143) Elbow: R: 0-150, L: 0-150 Wrist flexion: R: 55(68), L: 79 Writ extension: R: 46(64), L: 0(20)  RD: R: 10(20), L: 18(24) UD: R: 18(28), L: 14(20) Thumb radial abduction: R: 38(48), L:24(26) Thumb IP flexion: R: 35(50) L: 20(45) Digit flexion to the [facility]: R:  2nd: 3.5 cm(3cm), 3rd: 6cm( 6cm), 4th: 6 cm (5cm), 5th: 5cm(4.5cm) L: 2nd: 8cm(7.5cm), 3rd: 8cm(6cm), 4th: 7.5cm(6cm), 5th: 6cm(4.5cm)  04/04/2023  Shoulder flexion: R: 134(154), L: 160(160) Shoulder abduction: R: 114(130), L: 143(143) Elbow: R: WNL, L: WNL Wrist flexion: R: 55(68), L: 80 Writ extension: R: 46(64), L: -10(10) RD: R: 14(24), L: 20(24) UD: R: 20(30), L: 14(20) Thumb radial abduction: R: 38(48), L:28(30) Thumb IP flexion: R: 40(50) L: 25(45) Digit flexion to the [facility]: R:  2nd: 3.5 cm(3cm), 3rd: 6cm( 5cm), 4th: 6 cm  (4cm), 5th: 5cm(5cm) L: 2nd: 8cm(7.5cm), 3rd: 8cm(7cm), 4th: 7.5cm(7cm), 5th: 7cm(6cm)  05/25/2023  Shoulder flexion: R: 134(154), L: 160(160) Shoulder abduction: R: 117(134), L: 143(143) Elbow: R: WNL, L: WNL Wrist flexion: R: 55(68), L: 80 Wrist extension: R: 46(64), L: -8(14) RD: R: 14(24), L: 20(24) UD: R: 20(32), L: 14(22) Thumb radial abduction: R: 38(50), L:28(30) Thumb IP flexion: R: 40(50) L: 30(45) Digit flexion to the [facility]: R:  2nd: 3.5 cm(3cm), 3rd: 6cm( 5cm), 4th: 7 cm (4cm), 5th: 5cm(4cm) L: 2nd: 8cm(7.5cm), 3rd: 8cm(7cm), 4th: 7.5cm(7cm), 5th: 7cm(6cm)  COORDINATION:   01/19/2023:  9 hole peg test:  Right: 2 pegs placed in 1 min. & 5 sec.  Pt. was able to remove 9 pegs in 17 sec.  Left: 2 pegs placed in 1 min. & 10 sec. Pt. was able to remove 9 pegs in 20 sec.  04/04/23:  Pt. was able to consistently grasp the pegs however each one slipped out of her fingers when attempting to place them into the Pegboard.  05/25/2023:   Right: 9 pegs placed and removed in 2 min. & 41 sec. Left: Unable to grasp the pegs for a horizontal position.  SENSATION: Intact  EDEMA: N/A   COGNITION: Overall cognitive status: WNL   VISION: Subjective report: Wears glasses, No changes in vision.  PERCEPTION: Intact  TODAY'S TREATMENT:                                                                                                                              DATE:  07/04/23  Moist heat for Bilateral shoulder shoulders, and bilateral hands for muscle relaxation used simultaneous to passive stretching noted below  Therapeutic Ex.:  -Pt. tolerated PROM followed by AROM bilateral wrist extension, PROM for bilateral digit MP, PIP, and DIP flexion, and extension, thumb abduction.  -Lateral, and 3pt. Pinch strengthening using yellow level resistive clip to prepare the hand for using a reacher. -Pt. Worked on BUE strengthening, and reciprocal motion using the UBE while seated for 8 min.  with min-moderate resistance.   Therapeutic Activities:  -facilitated functional reaching in all planes with the right, and left hands for cones with 1#, and 2#  cuff weights in place. -facilitated functional reaching across midline  for cone placement -facilitated grasping with the right hand removing 1" resistive cubes from the pegboard, and isolating 2nd digit extension to press them into place.     PATIENT EDUCATION: Education details: Bilateral hand and shoulder ROM, and reacher use Person educated: Patient Education method: Explanation, Demonstration, and Verbal cues Education comprehension: verbalized understanding and returned demonstration  HOME EXERCISE PROGRAM: Continue to assess HEP needs, and provide as needed.   GOALS: Goals reviewed with patient? Yes  LONG TERM GOALS: Target date: 08/17/2023  3.  Patient will independently use a reacher to pick up items from various surfaces.  Baseline: 05/25/2023:  Pt. is now able to initiate grasping a reacher to open and close the reacher with the right hand, however requires the grasp handle to be repositioned  within the hand. Pt. requires work on using/moving the reacher with the right hand through various planes. 04/04/2023: Pt. continues to work on improving Digit flexion to the [facility] to be able to efficiently handle Adaptive equipment. 01/19/2023: Pt. Is improving with Bilateral digit flexion to the [facility]. Pt. has difficulty firmly holding adaptive devices. 12/06/2022: Pt. continues to present with increased MP, PIP, and DIP digit tightness/stiffness limiting the formulation of bilateral composites fists. 10/27/2022: Pt. presents with increased MP, PIP, and DIP digit tightness/stiffness limiting the formulation of bilateral composites fists during this progress reporting period limiting. Digit flexion to the [facility]: R:  2nd: 4 cm(3cm), 3rd: 6cm( 6cm), 4th: 6 cm (5cm), 5th: 5cm(5cm), L: 2nd: 8cm(8cm), 3rd: 8cm(6cm), 4th: 8cm(7cm), 5th: 6cm(6cm)  09/20/2022: Improving digit flexion to the [facility]. 09/13/22: improved digit flexion to the [facility]. 07/05/2022: Pt. presents with digit MP, PIP, and DIP extensor tightness limiting her ability to securely grip objects in her bilateral hands.  04/07/2022: Pt. Has improved with right 2nd, and 3rd digit flexion towards the [facility]. Pt. Continues to have difficulty securely holding and applying deodorant.02/03/2022: Pt. Has improved with digit flexion, however, continues to have difficulty securely holding and using deodorant. 12/09/2021: Bilateral hand/digit MP, PIP, and DIP extension tightness limits her ability achieve digit flexion to hold, and apply deodorant. 10/28/2021:  Pt. continues to have difficulty holding the deodorant. 01/12/2021: Pt. Presents with limited digit extension. Pt. Is able to initiate holding deodorant, however is unable to hold it while using it  Goal status: Ongoing   4.  Pt. Will improve bilateral wrist extension in preparation for anticipating, and initiating reaching for objects at the table.  Baseline: 05/25/23: Wrist extension: R: 46(64), L: -8(14) 04/04/23: R: 46(64) L: -10(10)01/19/2023:  R: 46(64) L: 0(20) 12/06/2022: Pt continues to to progress with bilateral wrist extension in preparation fro functional reaching. 10/27/2022: Pt. Is improving with bilateral wrist extension. R: 46(64) L: 0(20) 09/20/2022: limited PROM in the Left wrist extension 2/2 tightness/stiffness. 09/13/22: R: 55(68), L: 0(12) 07/19/2022: Bilateral wrist extension in limited. 07/05/2022: Right: 40(60) Left: 0(12) 04/07/2022: Right: 40(55) Left: 5(18) 02/03/2022: Right: 34(55) Left: 3(4) 12/09/2021: Right  34(55)  Left  3(4) 10/28/2021:  Right 22(38), Left 0(15) 09/14/2021:  Right: 17(35), left 2(15)  Goal status: Progressing/Ongoing  7.  Pt. Will increase bilateral lateral pinch strength by 2 lbs to be able to securely grasp items during ADLs, and IADL tasks.  Baseline: 01/19/2023: Deferred as the Pinch meter is out for  calibration.12/06/2022: Pt. Is able to more securely hold objects during ADLs/IADLs 10/27/2022: Pt. Is improving with holding items with her bilateral thumbs. (Pinch meter out for calibration) 09/20/2022: lateral pinch continues to be limited 09/13/22: R 6.5# L  4# 07/19/2022: TBD 07/05/2022: TBD 07/2022: NT-Pinch meter out for calibration. 02/03/2022: Right: 6#, Left: 4# 12/09/2021: Right: 6#, Left: 4#  Goal status: Deferred   9.  Pt. will complete plant care with modified independence.  Baseline:12/06/2022: Pt. Is independent with plant care in her husband's room, however is not able to access her plants due to furniture blocking access to them. 10/27/2022: Pt. Is able to water plants that are closer, and within reach. Pt. continues to have difficulty reaching for thorough plant care. 09/20/2022: Pt. Continues to have difficulty reaching for thorough plant care. 09/13/22: continues to report intermittent difficulty 07/19/2022: Pt. Continues to be able to water, and care for some of her plants. Pt. Has more difficulty with plants that are harder to reach. 07/05/2022: pt. Is able to water, and care for some of her plants. Pt. Has more difficulty with plants that are harder to reach. 04/07/2022: Pt. is now able to hold a cup and water her plants.02/03/2022: Pt. has difficulty caring for her plants.  Goal status:Achieved   10.  Pt. will demonstrate adaptive techniques to assist with the efficiency of self-dressing, or morning care tasks.  Baseline: 05/25/2023: Pt. continues to work on improving UE functioning to be able to efficiently use Adaptive equipment.  04/04/2023:  Pt. continues to work on improving UE functioning to be able to efficiently use Adaptive equipment. 01/19/2203: Pt. Is able to donn her jacket in reverse independently. Pt. requires assist from staff, as Pt. Reports staff have decreased time. 12/06/2022: Pt. requires assist from staff, as Pt. Reports staff have decreased time. 10/27/2022: Pt. is able to  assist with initiating UE dressing. Pt. requires assist from personal, and staff care aides. 09/20/2022: Pt. continues to require assist form personal, and staff care aides. 09/13/22: MODA dressing, reports she is often rushed 07/19/2022: Pt. continues to require assist with self-dressing/morning care tasks. 07/05/2022: Continue with goal. 04/07/2022: Pt. Continues to require assist with the efficiency of self-dressing, and morning care tasks.02/03/2022: Pt. requires assist from caregivers 2/2 time limitations during morning care.  Goal status: Ongoing  11.  Pt. will improve BUE strength to improve ADL, and IADL functioning.  Baseline: 05/25/2023: shoulder flexion L 4/5, R 4-/5; shoulder abduction L 4+/5, R 4-/5. elbow flexion B 5/5, elbow extension 4+/5 01/19/2023: L 4/5, R 3+/5  04/04/2023: shoulder flexion L 4/5, R 3+/5; shoulder abduction L 4+/5, R 3+/5. elbow flexion B 5/5, elbow extension 4/5 01/19/2023: L 4/5, R 3+/5 12/06/2022: Continue 10/27/2022: shoulder flexion L 4/5, R 3+/5; shoulder abduction L 4+/5, R 3+/5. elbow flexion B 5/5, elbow extension 4/5 09/20/2022: Pt. Presents with limited BUE strength 09/13/22: shoulder flexion L 4/5, R 3/5; elbow flexion B 5/5, elbow extension 4-/5; shoulder abduction L 4+/5, R 3+/5. 07/19/2022: BUE strength continues to be limited. 07/05/2022: shoulder flexion: right 4-/5, abduction: 3+/5, elbow flexion: right: 5/5, left 5/5, extension: right: 4-/5, left 4-/5, wrist extension: right: 3-/5, left: 2-/5  Goal status: Ongoing  12.  Pt. will improve bilateral thumb radial abduction in order to be able to hold the grab bars while standing with PT  Baseline: 05/25/23: Thumb radial abduction: R: 38(50), L:28(30) 04/04/23: R: 38(48), L:28(30) 01/19/2023: R: 38(48) L: 24(26)12/06/2022: Pt. Continues to present  with limited thumb abduction, however is improving holding onto the parallel bars.10/27/2022: thumb radial abduction: R: 30(42), L: 20(24  Goal status: Ongoing  13.  Pt. Will  be able to securely hold, and stabilize medication bottles at a tabletop surface when opening, and closing them.  Baseline: 01/19/2023: Pt. Is now able to securely hold medication bottles while opening them, and stabilizes them on surfaces while opening them. 12/06/2022: Pt is improving with securely stabilizing medication bottles. 10/27/2022: Pt. Stabilizes bottles against her torso when attempting to open, and close them   Goal status: Achieved  14: Pt. Will improve bilateral thumb IP flexion to improve active grasping patterns.    Baseline: 05/25/2023: Thumb IP flexion: R: 40(50) L: 30(45) 04/04/2023:  R: 40(50) L: 25(45) 01/19/2023: Right 35(50), Left: 20/45)    Goal status: Ongoing    15. Pt. Will improve bilateral FMC/speed, and dexterity. As evidence by improved scores on the 9 hole peg test.  Baseline: 05/25/2023: 9 Hole pegs test speed/dexterity: Right: 9 pegs placed and removed in 2 min. & 41 sec. Left: Unable to grasp the pegs for a horizontal position. 04/04/23: Pt. was able to consistently grasp the pegs however each one slipped out of her fingers when attempting to place them into the Pegboard. 01/19/2023: 2 pegs placed in 1 min. & 5 sec.  Pt. was able to remove 9 pegs in 17 sec.; Left: 2 pegs placed in 1 min. & 10 sec. Pt. was able to remove 9 pegs in 20 sec.   Goal status: Ongoing  ASSESSMENT:  CLINICAL IMPRESSION:  Pt. continues to make progress. Pt. was able to reach in multiple planes, however required the cuff weight to be adjusted from 2# to 1#. The resistive board was  modified to increase the challenge  from a flat position to a vertical position to promote wrist extension. Pt. continues to tolerate ROM well. Pt. continues to benefit from OT services to work on impoving ROM, and UE strength in order to work towards increasing bilateral hand grasp on objects, and increasing engagement of the bilateral hands during ADLs, and IADL tasks.     PERFORMANCE DE PERFORMANCE DEFICITS: in  functional skills including ADLs, IADLs, coordination, dexterity, ROM, strength, and UE functional use, cognitive skills including , and psychosocial skills including coping strategies and environmental adaptation.   IMPAIRMENTS: are limiting patient from ADLs, IADLs, and leisure.   CO-MORBIDITIES: may have co-morbidities  that affects occupational performance. Patient will benefit from skilled OT to address above impairments and improve overall function.  MODIFICATION OR ASSISTANCE TO COMPLETE EVALUATION: Min-Moderate modification of tasks or assist with assess necessary to complete an evaluation.  OT OCCUPATIONAL PROFILE AND HISTORY: Detailed assessment: Review of records and additional review of physical, cognitive, psychosocial history related to current functional performance.  CLINICAL DECISION MAKING: Moderate - several treatment options, min-mod task modification necessary  REHAB POTENTIAL: Good for stated goals  PLAN:  OT FREQUENCY 2x's a week  OT DURATION: 12 weeks  PLANNED INTERVENTIONS ADL training, A/E training, UE ther. Ex, Manual therapy, neuromuscular re-education, moist heat modality, Paraffin Bath, Splinting, and  Pt./caregiver education   RECOMMENDED OTHER SERVICES: PT  CONSULTED AND AGREED WITH PLAN OF CARE: Patient  PLAN FOR NEXT SESSION: Continue Treatment as per established POC  Duey Ghent, MS, OTR/L   07/04/23, 1:21 PM

## 2023-07-06 ENCOUNTER — Ambulatory Visit: Payer: Medicare HMO | Admitting: Occupational Therapy

## 2023-07-06 ENCOUNTER — Ambulatory Visit: Payer: Medicare HMO

## 2023-07-06 DIAGNOSIS — R278 Other lack of coordination: Secondary | ICD-10-CM

## 2023-07-06 DIAGNOSIS — R269 Unspecified abnormalities of gait and mobility: Secondary | ICD-10-CM

## 2023-07-06 DIAGNOSIS — M6281 Muscle weakness (generalized): Secondary | ICD-10-CM

## 2023-07-06 DIAGNOSIS — S14129S Central cord syndrome at unspecified level of cervical spinal cord, sequela: Secondary | ICD-10-CM

## 2023-07-06 DIAGNOSIS — R262 Difficulty in walking, not elsewhere classified: Secondary | ICD-10-CM

## 2023-07-06 DIAGNOSIS — R2689 Other abnormalities of gait and mobility: Secondary | ICD-10-CM

## 2023-07-06 DIAGNOSIS — R2681 Unsteadiness on feet: Secondary | ICD-10-CM

## 2023-07-06 DIAGNOSIS — S72001A Fracture of unspecified part of neck of right femur, initial encounter for closed fracture: Secondary | ICD-10-CM

## 2023-07-06 NOTE — Therapy (Signed)
 OUTPATIENT PHYSICAL THERAPY NEURO TREATMENT   Patient Name: Sherry [name] MRN: [id number] DOB:Jun 06, 1934, 89 y.o., female Today's Date: 07/07/2023   PCP: Valrie Gehrig, MD REFERRING PROVIDER: Marlynn Singer   PT End of Session - 07/06/23 1232     Visit Number 119    Number of Visits 139    Date for PT Re-Evaluation 09/12/23    Progress Note Due on Visit 120    PT Start Time 1145    PT Stop Time 1225    PT Time Calculation (min) 40 min    Equipment Utilized During Treatment Gait belt    Activity Tolerance Patient tolerated treatment well;No increased pain    Behavior During Therapy WFL for tasks assessed/performed                          Past Medical History:  Diagnosis Date   Acute blood loss anemia    Arthritis    Cancer (HCC)    skin   Central cord syndrome at C6 level of cervical spinal cord (HCC) 11/29/2017   Hypertension    Protein-calorie malnutrition, severe (HCC) 01/24/2018   S/P insertion of IVC (inferior vena caval) filter 01/24/2018   Tetraparesis (HCC)    Past Surgical History:  Procedure Laterality Date   ANTERIOR CERVICAL DECOMP/DISCECTOMY FUSION N/A 11/29/2017   Procedure: Cervical five-six, six-seven Anterior Cervical Decompression Fusion;  Surgeon: Cannon Champion, MD;  Location: [facility] OR;  Service: Neurosurgery;  Laterality: N/A;  Cervical five-six, six-seven Anterior Cervical Decompression Fusion   CATARACT EXTRACTION     FEMUR IM NAIL Left 08/16/2021   Procedure: INTRAMEDULLARY (IM) NAIL FEMORAL;  Surgeon: Marlynn Singer, MD;  Location: [facility] ORS;  Service: Orthopedics;  Laterality: Left;   IR IVC FILTER PLMT / S&I /IMG GUID/MOD SED  12/08/2017   Patient Active Problem List   Diagnosis Date Noted   Age-related osteoporosis without current pathological fracture 07/27/2022   Mild recurrent major depression (HCC) 07/27/2022   Hypertensive heart disease with other congestive heart failure (HCC) 02/14/2022   Chronic indwelling  Foley catheter 02/14/2022   Closed hip fracture (HCC) 08/15/2021   DVT (deep venous thrombosis) (HCC) 08/15/2021   Quadriplegia (HCC) 08/15/2021   Fall 08/15/2021   Constipation due to slow transit 08/31/2018   Trauma 06/05/2018   Neuropathic pain 06/05/2018   Neurogenic bowel 06/05/2018   Vaginal yeast infection 01/30/2018   Healthcare-associated pneumonia 01/25/2018   Chronic allergic rhinitis 01/24/2018   Depression with anxiety 01/24/2018   UTI due to Klebsiella species 01/24/2018   Protein-calorie malnutrition, severe (HCC) 01/24/2018   S/P insertion of IVC (inferior vena caval) filter 01/24/2018   Tetraparesis (HCC) 01/20/2018   Neurogenic bladder 01/20/2018   Reactive depression    Benign essential HTN    Acute postoperative anemia due to expected blood loss    Central cord syndrome at C6 level of cervical spinal cord (HCC) 11/29/2017   Allergy to alpha-gal 11/25/2016   SCC (squamous cell carcinoma) 04/08/2014    ONSET DATE: 08/15/2021 (fall with B hip fx); Initial injury was in 2019- quadraplegia due to central cord syndrome of C6 secondary to neck fracture.   REFERRING DIAG: Bilateral Hip fx; Left Open- s/p Left IM nail femoral on 08/16/2021; Right closed  THERAPY DIAG:  Muscle weakness (generalized)  Central cord syndrome, sequela (HCC)  Other lack of coordination  Other abnormalities of gait and mobility  Difficulty in walking, not elsewhere classified  Abnormality of gait and mobility  Unsteadiness on feet  Closed fracture of both hips, initial encounter [facility])  Rationale for Evaluation and Treatment Rehabilitation  SUBJECTIVE:   SUBJECTIVE STATEMENT:    Patient reports feeling okay- No new issues.         Pt accompanied by: Paid caregiver-   PERTINENT HISTORY: Sherry [name] is an 87yoF who experienced a fall at home on 08/15/2021 with hip fracture, s/p Left hip ORIF. PMH: quadriplegia due to central cord syndrome of C6 secondary to neck fracture,  HTN, depression with anxiety, neurogenic bladder, bilateral DVT on Xarelto  (s/p of IVC filter placement). Prior history of significant PT/OT with improvement of function. Pt has full use of shoulder/elbows, limited use of hands, adaptive self feeding sucessful.     PAIN:  None current but does endorse some left thigh pain intermittent  OBJECTIVE:   TODAY'S TREATMENT:   07/06/23     Therapeutic Activities: dynamic therapeutic activities designed to achieve improved functional performance -all activities performed in chair today.  Sit to stand Max assist (patient exhibiting some increased valgus and difficulty standing today on left LE) - unable to get her heel down well and stopped standing after several seconds due to increased overall weakness with difficulty placing left Foot in proper position to avoid injury.   Attempted 1 more time yet same result so terminated standing today  In powerchair:    Ant/post weight shift (Forward/backward) trunk lean x 15 reps  Active then AAROM- hip march x 10 reps each LE   Active to AAROM hip abd/add 2 x 15 reps each LE.   Assisted knee flex (Left and Right) 2 x 10 reps.  Seated knee ext (AROM) 2 x 10 reps   Manual resisted leg press- x 15 reps each LE       PATIENT EDUCATION: Education details: Exercise technique Patient verbalized understanding.   HOME EXERCISE PROGRAM: No changes at this time  Access Code: [facility] URL: [email or web address] Date: 11/23/2021 Prepared by: Marina  Moser Exercises - Seated Gluteal Sets - 2 x daily - 7 x weekly - 2 sets - 10 reps - 5 hold - Seated Quad Set - 2 x daily - 7 x weekly - 2 sets - 10 reps - 5 hold - Seated Long Arc Quad - 2 x daily - 7 x weekly - 2 sets - 10 reps - 5 hold - Seated March - 2 x daily - 7 x weekly - 2 sets - 10 reps - 5 hold - Seated Hip Abduction - 2 x daily - 7 x weekly - 2 sets - 10 reps - 5 hold - Seated Shoulder Shrugs - 2 x daily - 7 x weekly - 2 sets - 10  reps - 5 hold - Wheelchair Pressure Relief - 2 x daily - 7 x weekly - 2 sets - 10 reps - 5 hold     GOALS: Goals reviewed with patient? Yes  SHORT TERM GOALS: Target date: 02/22/2022  Pt will be independent with initial UE strengthening HEP in order to improve strength and balance in order to decrease fall risk and improve function at home and work. Baseline: 10/19/2021- patient with no formal UE HEP; 12/02/2021= Patient verbalized knowledge of HEP including use of theraband for UE strengthening.  Goal status: GOAL MET  LONG TERM GOALS: Target date: 09/12/2023   Pt will be independent with final for UE/LE HEP in order to improve strength and balance in order to decrease fall risk and improve function at home and work. Baseline:  Patient is currently BLE NWB and unable to participate in HEP. Has order for UE strengthening. 01/11/2022- Patient now able to participate in LE strengthening although NWB still- good understanding for some basic exercises. Will keep goal active to incorporate progressive LE strengthening exercises. 04/07/2022= Patient still participating in progressive LE seated HEP and has no questions at this time.  Goal status: MET  2.  Pt will improve FOTO to target score of 35  to display perceived improvements in ability to complete ADL's.  Baseline: 10/19/2021= 12; 12/02/2021= 17; 01/11/2022=17; 04/07/2022= 17; 07/05/2022=17; 10/06/2022- outcome measure not appropriate as patient in non-ambulatory and not consistently standing. Goal status: Goal not appropriate  3.  Pt will increase strength of B UE  by at least 1/2 MMT grade in order to demonstrate improvement in strength and function  Baseline: patient has range of 2-/5 to 4/5 BUE Strength; 12/02/2021= 4/5 except with wrist ext. 01/11/2022= 4/5 B UE strength expept for wrist Goal status: Goal revised-no longer appropriate- working with OT on all UE strengthening.   4. Pt. Will increase strength of RLE by at least 1/2 MMT grade in order  to demonstrate improvement in Standing/transfers.  Baseline: 2-/5 Right hip flex/knee ext/flex; 04/07/2022= 2- with right hip flex/knee ext (lacking 28 deg from zero) 4/8= Patient able to ext right knee lacking 18 deg from zero. 07/05/2022=Left knee approx 8 deg from zero and right knee = 23 deg from zero; 12/29/2022 - Left LE = full ROM 4-/5 and right knee ext= 23 deg from zero= 2+/5 (gravity minimized); 03/28/2023= Left knee ext=  4/5 and right knee ext= 3-/5 (able to move lower leg through incomplete ROM -lacking 20 deg from full ROM against gravity); 05/23/2023= right knee ext= 3-/5 (able to move lower leg through incomplete ROM -lacking 17 deg from full ROM against gravity); 06/20/2023= Right knee ext= lacking 20 deg from zero  Goal status: Progressing  5. Pt. Will demo ability to stand pivot transfer with max assist for improved functional mobility and less dependent need on mechanical device.   Baseline: Dependent on hoyer lift for all transfers; 04/07/2022- Unable to test secondary to patient with recent UTI and right hand procedure - will attempt to reassess next visit. ; 04/12/22: Unable to successfully stand due to feet plates on loaned power w/c; 05/10/2022- Patient still having to use rental chair and has not received her original power w/c back to practice SPT. 07/04/2021= Patient just received new power w/c and attempted standing from 1st time today- Patient able to stand with max assist today from w/c and did not attempt to perform pivot transfer- difficulty with static standing and placing weight on right LE. 10/06/2022- Not assessed today- patient needs new AFO prior to attempting transfers; 12/29/2022= Patient has not attempted SPT yet - still trying to improve LE strength enough to stand well without knee buckling with weight shifting. 03/28/2023- not attempted today due to ill fitting brace but patient last performed SPT on 01/10/23 and was able to perform with Max A with feet pre-positioned to angle toward  direction of transfer; 05/23/2023- Not tested today but on last visit- patient able to perform SPT with max assist yet concern for twisting knee as she was unable to turn/pivot foot .  06/20/2023- deferred to next session due to patient fatigued with static standing today.  Goal status: ONGOING  6. Pt. Will demonstrate improved functional LE strength as seen by ability to stand > 2 min for improved transfer ability and pregait abilities.  Baseline: not assessed today due to recent dx: UTI; 04/07/2022- Unable to test secondary to patient with recent UTI and right hand procedure - will attempt to reassess next visit; 04/12/22: Unable to successfully stand due to feet plates on loaned power w/c 05/10/2022- Patient still having to use rental chair and has not received her original power w/c back to practice Standing. 07/05/2022- Patient able to stand with max assist at trunk today for 2 min 3 sec. Will keep goal active to ensure consistency. 10/06/2022= Unable to assess today- patient with some recent blood pressure issues and needs new AFO. 11/24/2022= Patient demonstrated multiple rounds of standing in // bars- holding up to 1:45 min today but has been able to exhibit 2 min in recent past- working on consistency. 12/29/2022= 2 min 40 sec with patient demonstrating improved overall posture and ability to contract glutes and quads with just min assist at trunk today.   Goal status: MET  7. Pt. Will demonstrate improved functional LE strength as seen by ability to stand > 5 min for improved transfer ability and pregait abilities. Baseline: 12/29/2022= 2 min 40 sec with patient demonstrating improved overall posture and ability to contract glutes and quads with just min assist at trunk today. 03/28/2023= Attempted 2 trials today - at best 2 min 48 sec today but during cert- did stand for 3 min 44 sec. 06/20/2023= Patient able to stand 2x (45 sec and 1 min 12 sec)- max assist yet no slippage of LLE.  Goal Status: Ongoing-  Patient has only been standing 2x since received new AFO shoe.  8. Patient will perform sit to stand transfer with moderate assist consistently > 75% of time for improved transfer ability and less dependence on caregiver.   Baseline: 12/29/2022- Patient currently max assist level assistance with all sit to stand activities. 03/28/2023= Patient is still majority of max assist to stand yet able to pull up from wheelchair smoother in one motion. 06/20/2023- Max Assist but has only most recently resumed standing since last week so goal still appropriate for new cert  Goal status: ONGOING   ASSESSMENT:  CLINICAL IMPRESSION:   Treatment in standing was halted due to patient present with inability to proper position her left LE underneath her- (excessive knee valgus and only 1 person assist and did not want to risk left LE injury. She was then able to perform several LE strengthening but did require more physical assist with LLE today.  Pt will continue to benefit from skilled PT services to optimize independence and reduced caregiver support.   OBJECTIVE IMPAIRMENTS Abnormal gait, decreased activity tolerance, decreased balance, decreased coordination, decreased endurance, decreased mobility, difficulty walking, decreased ROM, decreased strength, hypomobility, impaired flexibility, impaired UE functional use, postural dysfunction, and pain.   ACTIVITY LIMITATIONS carrying, lifting, bending, sitting, standing, squatting, sleeping, stairs, transfers, bed mobility, continence, bathing, toileting, dressing, self feeding, reach over head, hygiene/grooming, and caring for others  PARTICIPATION LIMITATIONS: meal prep, cleaning, laundry, medication management, personal finances, interpersonal relationship, driving, shopping, community activity, and yard work  PERSONAL FACTORS Age, Time since onset of injury/illness/exacerbation, and 1-2 comorbidities: HTN, cervical Sx are also affecting patient's functional  outcome.   REHAB POTENTIAL: Good  CLINICAL DECISION MAKING: Evolving/moderate complexity  EVALUATION COMPLEXITY: Moderate  PLAN: PT FREQUENCY: 1-2x/week  PT DURATION: 12 weeks  PLANNED INTERVENTIONS: Therapeutic exercises, Therapeutic activity, Neuromuscular re-education, Balance training, Gait training, Patient/Family education, Self Care, Joint mobilization, DME instructions, Dry Needling, Electrical stimulation, Wheelchair mobility training, Spinal mobilization, Cryotherapy, Moist  heat, and Manual therapy  PLAN FOR NEXT SESSION:   Continue with LE and core strength training  Sit to stand and static standing SPT as able.     1:42 PM, 07/07/23  1:42 PM, 07/07/23 Ossie Blend, PT Physical Therapist - [facility]  Outpatient Physical Therapy- Main Campus [phone]

## 2023-07-06 NOTE — Therapy (Signed)
 Occupational Therapy  Neuro Treatment Note  Patient Name: Sherry [name] MRN: [id number] DOB:1935-01-06, 89 y.o., female Today's Date: 11/29/2022  PCP:  Waunita Haff PROVIDER: Valrie Gehrig, MD  END OF SESSION:   OT End of Session - 07/06/23 1800     Visit Number [id number]    Number of Visits 192    Date for OT Re-Evaluation 08/17/23    Authorization Time Period Progress report period starting 04/04/23    OT Start Time 1105    OT Stop Time 1145    OT Time Calculation (min) 40 min    Activity Tolerance Patient tolerated treatment well    Behavior During Therapy [facility] for tasks assessed/performed                         Past Medical History:  Diagnosis Date   Acute blood loss anemia    Arthritis    Cancer (HCC)    skin   Central cord syndrome at C6 level of cervical spinal cord (HCC) 11/29/2017   Hypertension    Protein-calorie malnutrition, severe (HCC) 01/24/2018   S/P insertion of IVC (inferior vena caval) filter 01/24/2018   Tetraparesis (HCC)    Past Surgical History:  Procedure Laterality Date   ANTERIOR CERVICAL DECOMP/DISCECTOMY FUSION N/A 11/29/2017   Procedure: Cervical five-six, six-seven Anterior Cervical Decompression Fusion;  Surgeon: Cannon Champion, MD;  Location: [facility] OR;  Service: Neurosurgery;  Laterality: N/A;  Cervical five-six, six-seven Anterior Cervical Decompression Fusion   CATARACT EXTRACTION     FEMUR IM NAIL Left 08/16/2021   Procedure: INTRAMEDULLARY (IM) NAIL FEMORAL;  Surgeon: Marlynn Singer, MD;  Location: [facility] ORS;  Service: Orthopedics;  Laterality: Left;   IR IVC FILTER PLMT / S&I /IMG GUID/MOD SED  12/08/2017   Patient Active Problem List   Diagnosis Date Noted   Age-related osteoporosis without current pathological fracture 07/27/2022   Mild recurrent major depression (HCC) 07/27/2022   Hypertensive heart disease with other congestive heart failure (HCC) 02/14/2022   Chronic indwelling Foley catheter 02/14/2022   Closed  hip fracture (HCC) 08/15/2021   DVT (deep venous thrombosis) (HCC) 08/15/2021   Quadriplegia (HCC) 08/15/2021   Fall 08/15/2021   Constipation due to slow transit 08/31/2018   Trauma 06/05/2018   Neuropathic pain 06/05/2018   Neurogenic bowel 06/05/2018   Vaginal yeast infection 01/30/2018   Healthcare-associated pneumonia 01/25/2018   Chronic allergic rhinitis 01/24/2018   Depression with anxiety 01/24/2018   UTI due to Klebsiella species 01/24/2018   Protein-calorie malnutrition, severe (HCC) 01/24/2018   S/P insertion of IVC (inferior vena caval) filter 01/24/2018   Tetraparesis (HCC) 01/20/2018   Neurogenic bladder 01/20/2018   Reactive depression    Benign essential HTN    Acute postoperative anemia due to expected blood loss    Central cord syndrome at C6 level of cervical spinal cord (HCC) 11/29/2017   Allergy to alpha-gal 11/25/2016   SCC (squamous cell carcinoma) 04/08/2014   ONSET DATE: 11/29/2017  REFERRING DIAG: Central Cord Syndrome at C6 of the Cervical Spinal Cord, Fall with Bilateral Closed Hip Fractures, with ORIF repair with Intramedullary Nailing of the Left Hip Fracture.   THERAPY DIAG:  Muscle weakness (generalized)  Other lack of coordination  Rationale for Evaluation and Treatment: Rehabilitation  SUBJECTIVE:  SUBJECTIVE STATEMENT:  Pt. reports doing okay today.   Pt accompanied by: self, Personal Care aide  PERTINENT HISTORY: Central Cord Syndrome at C6 of the Cervical Spinal Cord, Fall with Bilateral Closed  Hip Fractures, with ORIF repair with Intramedullary Nailing of the Left Hip Fracture.   PRECAUTIONS: None  WEIGHT BEARING RESTRICTIONS: No  PAIN:  Are you having pain? No pain   LIVING ENVIRONMENT: Lives with: [facility] Stairs: No Has following equipment at home: Wheelchair (power)  PLOF: Independent   PATIENT GOALS:  See below for established goals  OBJECTIVE:  Note: Objective measures were completed at  Evaluation unless otherwise noted.  HAND DOMINANCE: Right  ADLs: Caregiver assist with ADLs, and [facility]  MOBILITY STATUS: Uses a power w/c  ACTIVITY TOLERANCE: Activity tolerance: Fair  FUNCTIONAL OUTCOME MEASURES:  Measurements:   12/09/2021:   Shoulder flexion: R: 120(136), L: 138(150) Shoulder abduction: R: 108(120), L: 110(120) Elbow: R: 0-148, L: 0-146 Wrist flexion: R: 55(62), L: 78 Writ extension: R: 34(55), L: 3(4) RD: R: 12(20), L: 16(26) UD: R: 8(24), L: 6(18) Thumb radial abduction: R: 11(20), L: 8(20) Digit flexion to the [facility]: R:  2nd: 5cm(3.5cm), 3rd: 7.5 cm(5cm), 4th: 7cm(3cm), 5th: 6cm(5.5cm) L: 2nd: 8cm(8cm), 3rd: 8cm(8cm), 4th: 7.5cm(7cm), 5th: 7cm(7cm)   02/03/2022:   Shoulder flexion: R: 122(138), L: 148(154) Shoulder abduction: R: 109(125), L: 125(125 Elbow: R: 0-148, L: 0-146 Wrist flexion: R: 60(68), L: 79 Writ extension: R: 40(55), L: 3(10) RD: R: 14(24), L: 20(28) UD: R: 8(24), L: 6(18) Thumb radial abduction: R: 20(25), L: 8(20) Digit flexion to the [facility]: R:  2nd: 4.5cm(3cm), 3rd: 6.5cm(4.5cm), 4th: 6.5 cm(3cm), 5th: 4.5cm(5cm) L: 2nd: 8cm(8cm), 3rd: 8cm(7cm), 4th: 7.5cm(7cm), 5th: 6.5cm(6.5cm)   04/07/2022:   Shoulder flexion: R: 125(150), L: 160(160) Shoulder abduction: R: 109(125), L: 125(125 Elbow: R: 0-148, L: 0-146 Wrist flexion: R: 60(68), L: 79 Writ extension: R: 40(55), L: 5(18) RD: R: 14(24), L: 20(28) UD: R: 18(24), L: 8(18) Thumb radial abduction: R: 20(25), L: 8(20) Digit flexion to the [facility]: R:  2nd: 4.5 cm(4 cm), 3rd: 6.5cm(3cm), 4th: 7 cm (5cm), 5th: 5cm(5cm) L: 2nd: 8cm(8cm), 3rd: 8cm(7cm), 4th: 7.5cm(7cm), 5th: 7cm(7cm)   07/05/2022:   Shoulder flexion: R: 125(154), L: 160(160) Shoulder abduction: R: 95(130), L: 143(143) Elbow: R: 0-148, L: 0-146 Wrist flexion: R: 60(68), L: 79 Writ extension: R: 40(60), L: 0(12) RD: R: 10(24), L: 12(20) UD: R: 16(24), L: 8(16) Thumb radial abduction: R: 20(25), L:  8(20) Digit flexion to the [facility]: R:  2nd: 4 cm(4cm), 3rd: 6.5cm( 3.5cm), 4th: 7 cm (5cm), 5th: 6cm(5cm) L: 2nd: 8cm(8cm), 3rd: 8cm(7cm), 4th: 8cm(7cm), 5th: 7cm(7cm)   09/13/2022:   Shoulder flexion: R: 125(154), L: 160(160) Shoulder abduction: R: 100(130), L: 143(143) Elbow: R: 0-150, L: 0-150 Wrist flexion: R: 55(68), L: 79 Writ extension: R: 45(60), L: 0(12) RD: R: 10(24), L: 12(20) UD: R: 16(24), L: 8(16) Thumb radial abduction: R: 20(25), L: 8(20) Digit flexion to the [facility]: R:  2nd: 4 cm(4cm), 3rd: 6cm( 5cm), 4th: 6 cm (4cm), 5th: 5cm(5cm) L: 2nd: 7cm(cm), 3rd: 7cm(6cm), 4th: 7cm(6cm), 5th: 6cm(6cm)   10/27/2022   Shoulder flexion: R: 128(154), L: 160(160) Shoulder abduction: R: 105(130), L: 143(143) Elbow: R: 0-150, L: 0-150 Wrist flexion: R: 55(68), L: 79 Writ extension: R: 46(64), L: 0(20) RD: R: 10(20), L: 18(24) UD: R: 18(28), L: 8(20) Thumb radial abduction: R: 30(42), L:20(24) Digit flexion to the [facility]: R:  2nd: 4 cm(3cm), 3rd: 6cm( 6cm), 4th: 6 cm (5cm), 5th: 5cm(5cm) L: 2nd: 8cm(8cm), 3rd: 8cm(6cm), 4th: 8cm(7cm), 5th: 6cm(6cm)  01/19/2023  Shoulder flexion: R: 131(154), L: 160(160) Shoulder abduction: R: 110(130), L: 143(143) Elbow: R: 0-150, L: 0-150 Wrist  flexion: R: 55(68), L: 79 Writ extension: R: 46(64), L: 0(20) RD: R: 10(20), L: 18(24) UD: R: 18(28), L: 14(20) Thumb radial abduction: R: 38(48), L:24(26) Thumb IP flexion: R: 35(50) L: 20(45) Digit flexion to the [facility]: R:  2nd: 3.5 cm(3cm), 3rd: 6cm( 6cm), 4th: 6 cm (5cm), 5th: 5cm(4.5cm) L: 2nd: 8cm(7.5cm), 3rd: 8cm(6cm), 4th: 7.5cm(6cm), 5th: 6cm(4.5cm)  04/04/2023  Shoulder flexion: R: 134(154), L: 160(160) Shoulder abduction: R: 114(130), L: 143(143) Elbow: R: WNL, L: WNL Wrist flexion: R: 55(68), L: 80 Writ extension: R: 46(64), L: -10(10) RD: R: 14(24), L: 20(24) UD: R: 20(30), L: 14(20) Thumb radial abduction: R: 38(48), L:28(30) Thumb IP flexion: R: 40(50) L: 25(45) Digit flexion to the  [facility]: R:  2nd: 3.5 cm(3cm), 3rd: 6cm( 5cm), 4th: 6 cm (4cm), 5th: 5cm(5cm) L: 2nd: 8cm(7.5cm), 3rd: 8cm(7cm), 4th: 7.5cm(7cm), 5th: 7cm(6cm)  05/25/2023  Shoulder flexion: R: 134(154), L: 160(160) Shoulder abduction: R: 117(134), L: 143(143) Elbow: R: WNL, L: WNL Wrist flexion: R: 55(68), L: 80 Wrist extension: R: 46(64), L: -8(14) RD: R: 14(24), L: 20(24) UD: R: 20(32), L: 14(22) Thumb radial abduction: R: 38(50), L:28(30) Thumb IP flexion: R: 40(50) L: 30(45) Digit flexion to the [facility]: R:  2nd: 3.5 cm(3cm), 3rd: 6cm( 5cm), 4th: 7 cm (4cm), 5th: 5cm(4cm) L: 2nd: 8cm(7.5cm), 3rd: 8cm(7cm), 4th: 7.5cm(7cm), 5th: 7cm(6cm)  COORDINATION:   01/19/2023:  9 hole peg test:  Right: 2 pegs placed in 1 min. & 5 sec.  Pt. was able to remove 9 pegs in 17 sec.  Left: 2 pegs placed in 1 min. & 10 sec. Pt. was able to remove 9 pegs in 20 sec.  04/04/23:  Pt. was able to consistently grasp the pegs however each one slipped out of her fingers when attempting to place them into the Pegboard.  05/25/2023:   Right: 9 pegs placed and removed in 2 min. & 41 sec. Left: Unable to grasp the pegs for a horizontal position.  SENSATION: Intact  EDEMA: N/A   COGNITION: Overall cognitive status: WNL   VISION: Subjective report: Wears glasses, No changes in vision.  PERCEPTION: Intact  TODAY'S TREATMENT:                                                                                                                              DATE:  6/4//25  Moist heat for Bilateral shoulder shoulders, and bilateral hands for muscle relaxation used simultaneous to passive stretching noted below  Therapeutic Ex.:  -Pt. tolerated PROM followed by AROM bilateral wrist extension, PROM for bilateral digit MP, PIP, and DIP flexion, and extension, thumb abduction.   Therapeutic Activities:  -EZ Board exercises performed to target forearm supination/pronation, wrist flexion/extension using gross grasp, and lateral  pinch (key) grasp in multiple  planes to promote shoulder flexion, abduction, and wrist flexion, and extension while performing resistive wrist flexion and extension with a gross grip.      PATIENT EDUCATION: Education details: Bilateral hand and  shoulder ROM, and reacher use Person educated: Patient Education method: Explanation, Demonstration, and Verbal cues Education comprehension: verbalized understanding and returned demonstration  HOME EXERCISE PROGRAM: Continue to assess HEP needs, and provide as needed.   GOALS: Goals reviewed with patient? Yes  LONG TERM GOALS: Target date: 08/17/2023  3.  Patient will independently use a reacher to pick up items from various surfaces.  Baseline: 05/25/2023:  Pt. is now able to initiate grasping a reacher to open and close the reacher with the right hand, however requires the grasp handle to be repositioned  within the hand. Pt. requires work on using/moving the reacher with the right hand through various planes. 04/04/2023: Pt. continues to work on improving Digit flexion to the [facility] to be able to efficiently handle Adaptive equipment. 01/19/2023: Pt. Is improving with Bilateral digit flexion to the [facility]. Pt. has difficulty firmly holding adaptive devices. 12/06/2022: Pt. continues to present with increased MP, PIP, and DIP digit tightness/stiffness limiting the formulation of bilateral composites fists. 10/27/2022: Pt. presents with increased MP, PIP, and DIP digit tightness/stiffness limiting the formulation of bilateral composites fists during this progress reporting period limiting. Digit flexion to the [facility]: R:  2nd: 4 cm(3cm), 3rd: 6cm( 6cm), 4th: 6 cm (5cm), 5th: 5cm(5cm), L: 2nd: 8cm(8cm), 3rd: 8cm(6cm), 4th: 8cm(7cm), 5th: 6cm(6cm) 09/20/2022: Improving digit flexion to the [facility]. 09/13/22: improved digit flexion to the [facility]. 07/05/2022: Pt. presents with digit MP, PIP, and DIP extensor tightness limiting her ability to securely grip objects in her  bilateral hands.  04/07/2022: Pt. Has improved with right 2nd, and 3rd digit flexion towards the [facility]. Pt. Continues to have difficulty securely holding and applying deodorant.02/03/2022: Pt. Has improved with digit flexion, however, continues to have difficulty securely holding and using deodorant. 12/09/2021: Bilateral hand/digit MP, PIP, and DIP extension tightness limits her ability achieve digit flexion to hold, and apply deodorant. 10/28/2021:  Pt. continues to have difficulty holding the deodorant. 01/12/2021: Pt. Presents with limited digit extension. Pt. Is able to initiate holding deodorant, however is unable to hold it while using it  Goal status: Ongoing   4.  Pt. Will improve bilateral wrist extension in preparation for anticipating, and initiating reaching for objects at the table.  Baseline: 05/25/23: Wrist extension: R: 46(64), L: -8(14) 04/04/23: R: 46(64) L: -10(10)01/19/2023:  R: 46(64) L: 0(20) 12/06/2022: Pt continues to to progress with bilateral wrist extension in preparation fro functional reaching. 10/27/2022: Pt. Is improving with bilateral wrist extension. R: 46(64) L: 0(20) 09/20/2022: limited PROM in the Left wrist extension 2/2 tightness/stiffness. 09/13/22: R: 55(68), L: 0(12) 07/19/2022: Bilateral wrist extension in limited. 07/05/2022: Right: 40(60) Left: 0(12) 04/07/2022: Right: 40(55) Left: 5(18) 02/03/2022: Right: 34(55) Left: 3(4) 12/09/2021: Right  34(55)  Left  3(4) 10/28/2021:  Right 22(38), Left 0(15) 09/14/2021:  Right: 17(35), left 2(15)  Goal status: Progressing/Ongoing  7.  Pt. Will increase bilateral lateral pinch strength by 2 lbs to be able to securely grasp items during ADLs, and IADL tasks.  Baseline: 01/19/2023: Deferred as the Pinch meter is out for calibration.12/06/2022: Pt. Is able to more securely hold objects during ADLs/IADLs 10/27/2022: Pt. Is improving with holding items with her bilateral thumbs. (Pinch meter out for calibration) 09/20/2022: lateral pinch continues  to be limited 09/13/22: R 6.5# L 4# 07/19/2022: TBD 07/05/2022: TBD 07/2022: NT-Pinch meter out for calibration. 02/03/2022: Right: 6#, Left: 4# 12/09/2021: Right: 6#, Left: 4#  Goal status: Deferred   9.  Pt. will complete plant care with modified independence.  Baseline:12/06/2022: Pt. Is independent with plant care in her husband's room, however is not able to access her plants due to furniture blocking access to them. 10/27/2022: Pt. Is able to water plants that are closer, and within reach. Pt. continues to have difficulty reaching for thorough plant care. 09/20/2022: Pt. Continues to have difficulty reaching for thorough plant care. 09/13/22: continues to report intermittent difficulty 07/19/2022: Pt. Continues to be able to water, and care for some of her plants. Pt. Has more difficulty with plants that are harder to reach. 07/05/2022: pt. Is able to water, and care for some of her plants. Pt. Has more difficulty with plants that are harder to reach. 04/07/2022: Pt. is now able to hold a cup and water her plants.02/03/2022: Pt. has difficulty caring for her plants.  Goal status:Achieved   10.  Pt. will demonstrate adaptive techniques to assist with the efficiency of self-dressing, or morning care tasks.  Baseline: 05/25/2023: Pt. continues to work on improving UE functioning to be able to efficiently use Adaptive equipment.  04/04/2023:  Pt. continues to work on improving UE functioning to be able to efficiently use Adaptive equipment. 01/19/2203: Pt. Is able to donn her jacket in reverse independently. Pt. requires assist from staff, as Pt. Reports staff have decreased time. 12/06/2022: Pt. requires assist from staff, as Pt. Reports staff have decreased time. 10/27/2022: Pt. is able to assist with initiating UE dressing. Pt. requires assist from personal, and staff care aides. 09/20/2022: Pt. continues to require assist form personal, and staff care aides. 09/13/22: MODA dressing, reports she is often rushed  07/19/2022: Pt. continues to require assist with self-dressing/morning care tasks. 07/05/2022: Continue with goal. 04/07/2022: Pt. Continues to require assist with the efficiency of self-dressing, and morning care tasks.02/03/2022: Pt. requires assist from caregivers 2/2 time limitations during morning care.  Goal status: Ongoing  11.  Pt. will improve BUE strength to improve ADL, and IADL functioning.  Baseline: 05/25/2023: shoulder flexion L 4/5, R 4-/5; shoulder abduction L 4+/5, R 4-/5. elbow flexion B 5/5, elbow extension 4+/5 01/19/2023: L 4/5, R 3+/5  04/04/2023: shoulder flexion L 4/5, R 3+/5; shoulder abduction L 4+/5, R 3+/5. elbow flexion B 5/5, elbow extension 4/5 01/19/2023: L 4/5, R 3+/5 12/06/2022: Continue 10/27/2022: shoulder flexion L 4/5, R 3+/5; shoulder abduction L 4+/5, R 3+/5. elbow flexion B 5/5, elbow extension 4/5 09/20/2022: Pt. Presents with limited BUE strength 09/13/22: shoulder flexion L 4/5, R 3/5; elbow flexion B 5/5, elbow extension 4-/5; shoulder abduction L 4+/5, R 3+/5. 07/19/2022: BUE strength continues to be limited. 07/05/2022: shoulder flexion: right 4-/5, abduction: 3+/5, elbow flexion: right: 5/5, left 5/5, extension: right: 4-/5, left 4-/5, wrist extension: right: 3-/5, left: 2-/5  Goal status: Ongoing  12.  Pt. will improve bilateral thumb radial abduction in order to be able to hold the grab bars while standing with PT  Baseline: 05/25/23: Thumb radial abduction: R: 38(50), L:28(30) 04/04/23: R: 38(48), L:28(30) 01/19/2023: R: 38(48) L: 24(26)12/06/2022: Pt. Continues to present  with limited thumb abduction, however is improving holding onto the parallel bars.10/27/2022: thumb radial abduction: R: 30(42), L: 20(24  Goal status: Ongoing  13.  Pt. Will be able to securely hold, and stabilize medication bottles at a tabletop surface when opening, and closing them.  Baseline: 01/19/2023: Pt. Is now able to securely hold medication bottles while opening them, and stabilizes  them on surfaces while opening them. 12/06/2022: Pt is improving with securely stabilizing medication bottles. 10/27/2022: Pt. Stabilizes bottles against her  torso when attempting to open, and close them   Goal status: Achieved  14: Pt. Will improve bilateral thumb IP flexion to improve active grasping patterns.    Baseline: 05/25/2023: Thumb IP flexion: R: 40(50) L: 30(45) 04/04/2023:  R: 40(50) L: 25(45) 01/19/2023: Right 35(50), Left: 20/45)    Goal status: Ongoing    15. Pt. Will improve bilateral FMC/speed, and dexterity. As evidence by improved scores on the 9 hole peg test.  Baseline: 05/25/2023: 9 Hole pegs test speed/dexterity: Right: 9 pegs placed and removed in 2 min. & 41 sec. Left: Unable to grasp the pegs for a horizontal position. 04/04/23: Pt. was able to consistently grasp the pegs however each one slipped out of her fingers when attempting to place them into the Pegboard. 01/19/2023: 2 pegs placed in 1 min. & 5 sec.  Pt. was able to remove 9 pegs in 17 sec.; Left: 2 pegs placed in 1 min. & 10 sec. Pt. was able to remove 9 pegs in 20 sec.   Goal status: Ongoing  ASSESSMENT:  CLINICAL IMPRESSION:  Pt. continues to make progress with bilateral hand function skills. Pt. requires assist to modify the position of  the EZBoard to promote the desired movement patterns, and grasp pterrns.  Pt. continues to tolerate ROM well. Pt. continues to benefit from OT services to work on impoving ROM, and UE strength in order to work towards increasing bilateral hand grasp on objects, and increasing engagement of her bilateral hands during ADLs, and IADL tasks.     PERFORMANCE DE PERFORMANCE DEFICITS: in functional skills including ADLs, IADLs, coordination, dexterity, ROM, strength, and UE functional use, cognitive skills including , and psychosocial skills including coping strategies and environmental adaptation.   IMPAIRMENTS: are limiting patient from ADLs, IADLs, and leisure.   CO-MORBIDITIES: may  have co-morbidities  that affects occupational performance. Patient will benefit from skilled OT to address above impairments and improve overall function.  MODIFICATION OR ASSISTANCE TO COMPLETE EVALUATION: Min-Moderate modification of tasks or assist with assess necessary to complete an evaluation.  OT OCCUPATIONAL PROFILE AND HISTORY: Detailed assessment: Review of records and additional review of physical, cognitive, psychosocial history related to current functional performance.  CLINICAL DECISION MAKING: Moderate - several treatment options, min-mod task modification necessary  REHAB POTENTIAL: Good for stated goals  PLAN:  OT FREQUENCY 2x's a week  OT DURATION: 12 weeks  PLANNED INTERVENTIONS ADL training, A/E training, UE ther. Ex, Manual therapy, neuromuscular re-education, moist heat modality, Paraffin Bath, Splinting, and  Pt./caregiver education   RECOMMENDED OTHER SERVICES: PT  CONSULTED AND AGREED WITH PLAN OF CARE: Patient  PLAN FOR NEXT SESSION: Continue Treatment as per established POC  Duey Ghent, MS, OTR/L   07/06/23, 6:08 PM

## 2023-07-11 ENCOUNTER — Ambulatory Visit: Payer: Medicare HMO | Admitting: Occupational Therapy

## 2023-07-11 ENCOUNTER — Ambulatory Visit: Payer: Medicare HMO

## 2023-07-11 NOTE — Therapy (Incomplete)
 OUTPATIENT PHYSICAL THERAPY NEURO TREATMENT/Physical Therapy Progress Note   Dates of reporting period  05/23/2023   to   07/11/2023    Patient Name: Sherry [name] MRN: [id number] DOB:09/08/1934, 89 y.o., female Today's Date: 07/11/2023   PCP: Valrie Gehrig, MD REFERRING PROVIDER: Marlynn Singer                  Past Medical History:  Diagnosis Date   Acute blood loss anemia    Arthritis    Cancer (HCC)    skin   Central cord syndrome at C6 level of cervical spinal cord (HCC) 11/29/2017   Hypertension    Protein-calorie malnutrition, severe (HCC) 01/24/2018   S/P insertion of IVC (inferior vena caval) filter 01/24/2018   Tetraparesis (HCC)    Past Surgical History:  Procedure Laterality Date   ANTERIOR CERVICAL DECOMP/DISCECTOMY FUSION N/A 11/29/2017   Procedure: Cervical five-six, six-seven Anterior Cervical Decompression Fusion;  Surgeon: Cannon Champion, MD;  Location: [facility] OR;  Service: Neurosurgery;  Laterality: N/A;  Cervical five-six, six-seven Anterior Cervical Decompression Fusion   CATARACT EXTRACTION     FEMUR IM NAIL Left 08/16/2021   Procedure: INTRAMEDULLARY (IM) NAIL FEMORAL;  Surgeon: Marlynn Singer, MD;  Location: [facility] ORS;  Service: Orthopedics;  Laterality: Left;   IR IVC FILTER PLMT / S&I /IMG GUID/MOD SED  12/08/2017   Patient Active Problem List   Diagnosis Date Noted   Age-related osteoporosis without current pathological fracture 07/27/2022   Mild recurrent major depression (HCC) 07/27/2022   Hypertensive heart disease with other congestive heart failure (HCC) 02/14/2022   Chronic indwelling Foley catheter 02/14/2022   Closed hip fracture (HCC) 08/15/2021   DVT (deep venous thrombosis) (HCC) 08/15/2021   Quadriplegia (HCC) 08/15/2021   Fall 08/15/2021   Constipation due to slow transit 08/31/2018   Trauma 06/05/2018   Neuropathic pain 06/05/2018   Neurogenic bowel 06/05/2018   Vaginal yeast infection 01/30/2018    Healthcare-associated pneumonia 01/25/2018   Chronic allergic rhinitis 01/24/2018   Depression with anxiety 01/24/2018   UTI due to Klebsiella species 01/24/2018   Protein-calorie malnutrition, severe (HCC) 01/24/2018   S/P insertion of IVC (inferior vena caval) filter 01/24/2018   Tetraparesis (HCC) 01/20/2018   Neurogenic bladder 01/20/2018   Reactive depression    Benign essential HTN    Acute postoperative anemia due to expected blood loss    Central cord syndrome at C6 level of cervical spinal cord (HCC) 11/29/2017   Allergy to alpha-gal 11/25/2016   SCC (squamous cell carcinoma) 04/08/2014    ONSET DATE: 08/15/2021 (fall with B hip fx); Initial injury was in 2019- quadraplegia due to central cord syndrome of C6 secondary to neck fracture.   REFERRING DIAG: Bilateral Hip fx; Left Open- s/p Left IM nail femoral on 08/16/2021; Right closed  THERAPY DIAG:  No diagnosis found.  Rationale for Evaluation and Treatment Rehabilitation  SUBJECTIVE:   SUBJECTIVE STATEMENT:   *** Patient reports feeling okay- No new issues.         Pt accompanied by: Paid caregiver-   PERTINENT HISTORY: Sherry [name] is an 87yoF who experienced a fall at home on 08/15/2021 with hip fracture, s/p Left hip ORIF. PMH: quadriplegia due to central cord syndrome of C6 secondary to neck fracture, HTN, depression with anxiety, neurogenic bladder, bilateral DVT on Xarelto  (s/p of IVC filter placement). Prior history of significant PT/OT with improvement of function. Pt has full use of shoulder/elbows, limited use of hands, adaptive self feeding sucessful.  PAIN:  None current but does endorse some left thigh pain intermittent  OBJECTIVE:   TODAY'S TREATMENT:   07/06/23     Therapeutic Activities: dynamic therapeutic activities designed to achieve improved functional performance -all activities performed in chair today.  Sit to stand Max assist (patient exhibiting some increased valgus and  difficulty standing today on left LE) - unable to get her heel down well and stopped standing after several seconds due to increased overall weakness with difficulty placing left Foot in proper position to avoid injury.   Attempted 1 more time yet same result so terminated standing today  In powerchair:    Ant/post weight shift (Forward/backward) trunk lean x 15 reps  Active then AAROM- hip march x 10 reps each LE   Active to AAROM hip abd/add 2 x 15 reps each LE.   Assisted knee flex (Left and Right) 2 x 10 reps.  Seated knee ext (AROM) 2 x 10 reps   Manual resisted leg press- x 15 reps each LE       PATIENT EDUCATION: Education details: Exercise technique Patient verbalized understanding.   HOME EXERCISE PROGRAM: No changes at this time  Access Code: [facility] URL: [email or web address] Date: 11/23/2021 Prepared by: Marina  Moser Exercises - Seated Gluteal Sets - 2 x daily - 7 x weekly - 2 sets - 10 reps - 5 hold - Seated Quad Set - 2 x daily - 7 x weekly - 2 sets - 10 reps - 5 hold - Seated Long Arc Quad - 2 x daily - 7 x weekly - 2 sets - 10 reps - 5 hold - Seated March - 2 x daily - 7 x weekly - 2 sets - 10 reps - 5 hold - Seated Hip Abduction - 2 x daily - 7 x weekly - 2 sets - 10 reps - 5 hold - Seated Shoulder Shrugs - 2 x daily - 7 x weekly - 2 sets - 10 reps - 5 hold - Wheelchair Pressure Relief - 2 x daily - 7 x weekly - 2 sets - 10 reps - 5 hold     GOALS: Goals reviewed with patient? Yes  SHORT TERM GOALS: Target date: 02/22/2022  Pt will be independent with initial UE strengthening HEP in order to improve strength and balance in order to decrease fall risk and improve function at home and work. Baseline: 10/19/2021- patient with no formal UE HEP; 12/02/2021= Patient verbalized knowledge of HEP including use of theraband for UE strengthening.  Goal status: GOAL MET  LONG TERM GOALS: Target date: 09/12/2023   Pt will be independent with final for  UE/LE HEP in order to improve strength and balance in order to decrease fall risk and improve function at home and work. Baseline: Patient is currently BLE NWB and unable to participate in HEP. Has order for UE strengthening. 01/11/2022- Patient now able to participate in LE strengthening although NWB still- good understanding for some basic exercises. Will keep goal active to incorporate progressive LE strengthening exercises. 04/07/2022= Patient still participating in progressive LE seated HEP and has no questions at this time.  Goal status: MET  2.  Pt will improve FOTO to target score of 35  to display perceived improvements in ability to complete ADL's.  Baseline: 10/19/2021= 12; 12/02/2021= 17; 01/11/2022=17; 04/07/2022= 17; 07/05/2022=17; 10/06/2022- outcome measure not appropriate as patient in non-ambulatory and not consistently standing. Goal status: Goal not appropriate  3.  Pt will increase strength of B UE  by at  least 1/2 MMT grade in order to demonstrate improvement in strength and function  Baseline: patient has range of 2-/5 to 4/5 BUE Strength; 12/02/2021= 4/5 except with wrist ext. 01/11/2022= 4/5 B UE strength expept for wrist Goal status: Goal revised-no longer appropriate- working with OT on all UE strengthening.   4. Pt. Will increase strength of RLE by at least 1/2 MMT grade in order to demonstrate improvement in Standing/transfers.  Baseline: 2-/5 Right hip flex/knee ext/flex; 04/07/2022= 2- with right hip flex/knee ext (lacking 28 deg from zero) 4/8= Patient able to ext right knee lacking 18 deg from zero. 07/05/2022=Left knee approx 8 deg from zero and right knee = 23 deg from zero; 12/29/2022 - Left LE = full ROM 4-/5 and right knee ext= 23 deg from zero= 2+/5 (gravity minimized); 03/28/2023= Left knee ext=  4/5 and right knee ext= 3-/5 (able to move lower leg through incomplete ROM -lacking 20 deg from full ROM against gravity); 05/23/2023= right knee ext= 3-/5 (able to move lower leg  through incomplete ROM -lacking 17 deg from full ROM against gravity); 06/20/2023= Right knee ext= lacking 20 deg from zero  Goal status: Progressing  5. Pt. Will demo ability to stand pivot transfer with max assist for improved functional mobility and less dependent need on mechanical device.   Baseline: Dependent on hoyer lift for all transfers; 04/07/2022- Unable to test secondary to patient with recent UTI and right hand procedure - will attempt to reassess next visit. ; 04/12/22: Unable to successfully stand due to feet plates on loaned power w/c; 05/10/2022- Patient still having to use rental chair and has not received her original power w/c back to practice SPT. 07/04/2021= Patient just received new power w/c and attempted standing from 1st time today- Patient able to stand with max assist today from w/c and did not attempt to perform pivot transfer- difficulty with static standing and placing weight on right LE. 10/06/2022- Not assessed today- patient needs new AFO prior to attempting transfers; 12/29/2022= Patient has not attempted SPT yet - still trying to improve LE strength enough to stand well without knee buckling with weight shifting. 03/28/2023- not attempted today due to ill fitting brace but patient last performed SPT on 01/10/23 and was able to perform with Max A with feet pre-positioned to angle toward direction of transfer; 05/23/2023- Not tested today but on last visit- patient able to perform SPT with max assist yet concern for twisting knee as she was unable to turn/pivot foot .  06/20/2023- deferred to next session due to patient fatigued with static standing today.  Goal status: ONGOING  6. Pt. Will demonstrate improved functional LE strength as seen by ability to stand > 2 min for improved transfer ability and pregait abilities.   Baseline: not assessed today due to recent dx: UTI; 04/07/2022- Unable to test secondary to patient with recent UTI and right hand procedure - will attempt to reassess  next visit; 04/12/22: Unable to successfully stand due to feet plates on loaned power w/c 05/10/2022- Patient still having to use rental chair and has not received her original power w/c back to practice Standing. 07/05/2022- Patient able to stand with max assist at trunk today for 2 min 3 sec. Will keep goal active to ensure consistency. 10/06/2022= Unable to assess today- patient with some recent blood pressure issues and needs new AFO. 11/24/2022= Patient demonstrated multiple rounds of standing in // bars- holding up to 1:45 min today but has been able to exhibit 2  min in recent past- working on consistency. 12/29/2022= 2 min 40 sec with patient demonstrating improved overall posture and ability to contract glutes and quads with just min assist at trunk today.   Goal status: MET  7. Pt. Will demonstrate improved functional LE strength as seen by ability to stand > 5 min for improved transfer ability and pregait abilities. Baseline: 12/29/2022= 2 min 40 sec with patient demonstrating improved overall posture and ability to contract glutes and quads with just min assist at trunk today. 03/28/2023= Attempted 2 trials today - at best 2 min 48 sec today but during cert- did stand for 3 min 44 sec. 06/20/2023= Patient able to stand 2x (45 sec and 1 min 12 sec)- max assist yet no slippage of LLE.  Goal Status: Ongoing- Patient has only been standing 2x since received new AFO shoe.  8. Patient will perform sit to stand transfer with moderate assist consistently > 75% of time for improved transfer ability and less dependence on caregiver.   Baseline: 12/29/2022- Patient currently max assist level assistance with all sit to stand activities. 03/28/2023= Patient is still majority of max assist to stand yet able to pull up from wheelchair smoother in one motion. 06/20/2023- Max Assist but has only most recently resumed standing since last week so goal still appropriate for new cert  Goal status:  ONGOING   ASSESSMENT:  CLINICAL IMPRESSION:   Treatment in standing was halted due to patient present with inability to proper position her left LE underneath her- (excessive knee valgus and only 1 person assist and did not want to risk left LE injury. She was then able to perform several LE strengthening but did require more physical assist with LLE today.  Pt will continue to benefit from skilled PT services to optimize independence and reduced caregiver support.   OBJECTIVE IMPAIRMENTS Abnormal gait, decreased activity tolerance, decreased balance, decreased coordination, decreased endurance, decreased mobility, difficulty walking, decreased ROM, decreased strength, hypomobility, impaired flexibility, impaired UE functional use, postural dysfunction, and pain.   ACTIVITY LIMITATIONS carrying, lifting, bending, sitting, standing, squatting, sleeping, stairs, transfers, bed mobility, continence, bathing, toileting, dressing, self feeding, reach over head, hygiene/grooming, and caring for others  PARTICIPATION LIMITATIONS: meal prep, cleaning, laundry, medication management, personal finances, interpersonal relationship, driving, shopping, community activity, and yard work  PERSONAL FACTORS Age, Time since onset of injury/illness/exacerbation, and 1-2 comorbidities: HTN, cervical Sx are also affecting patient's functional outcome.   REHAB POTENTIAL: Good  CLINICAL DECISION MAKING: Evolving/moderate complexity  EVALUATION COMPLEXITY: Moderate  PLAN: PT FREQUENCY: 1-2x/week  PT DURATION: 12 weeks  PLANNED INTERVENTIONS: Therapeutic exercises, Therapeutic activity, Neuromuscular re-education, Balance training, Gait training, Patient/Family education, Self Care, Joint mobilization, DME instructions, Dry Needling, Electrical stimulation, Wheelchair mobility training, Spinal mobilization, Cryotherapy, Moist heat, and Manual therapy  PLAN FOR NEXT SESSION:   Continue with LE and core strength  training  Sit to stand and static standing SPT as able.     8:12 AM, 07/11/23  8:12 AM, 07/11/23 Ossie Blend, PT Physical Therapist - [facility]  Outpatient Physical Therapy- Main Campus [phone]

## 2023-07-13 ENCOUNTER — Ambulatory Visit: Payer: Medicare HMO | Admitting: Occupational Therapy

## 2023-07-13 ENCOUNTER — Ambulatory Visit: Payer: Medicare HMO

## 2023-07-13 DIAGNOSIS — S14129S Central cord syndrome at unspecified level of cervical spinal cord, sequela: Secondary | ICD-10-CM

## 2023-07-13 DIAGNOSIS — S72001A Fracture of unspecified part of neck of right femur, initial encounter for closed fracture: Secondary | ICD-10-CM

## 2023-07-13 DIAGNOSIS — R262 Difficulty in walking, not elsewhere classified: Secondary | ICD-10-CM

## 2023-07-13 DIAGNOSIS — M6281 Muscle weakness (generalized): Secondary | ICD-10-CM | POA: Diagnosis not present

## 2023-07-13 DIAGNOSIS — R269 Unspecified abnormalities of gait and mobility: Secondary | ICD-10-CM

## 2023-07-13 DIAGNOSIS — R2689 Other abnormalities of gait and mobility: Secondary | ICD-10-CM

## 2023-07-13 DIAGNOSIS — R278 Other lack of coordination: Secondary | ICD-10-CM

## 2023-07-13 DIAGNOSIS — R2681 Unsteadiness on feet: Secondary | ICD-10-CM

## 2023-07-13 NOTE — Therapy (Signed)
 Occupational Therapy Progress Note  Dates of reporting period  05/25/23   to   07/13/23   Patient Name: Sherry [name] MRN: [id number] DOB:1934-04-25, 89 y.o., female Today's Date: 11/29/2022  PCP:  Waunita Haff PROVIDER: Valrie Gehrig, MD  END OF SESSION:   OT End of Session - 07/13/23 1225     Visit Number 130    Number of Visits 192    Date for OT Re-Evaluation 10/05/23    OT Start Time 1105    OT Stop Time 1145    OT Time Calculation (min) 40 min    Activity Tolerance Patient tolerated treatment well    Behavior During Therapy [facility] for tasks assessed/performed                          Past Medical History:  Diagnosis Date   Acute blood loss anemia    Arthritis    Cancer (HCC)    skin   Central cord syndrome at C6 level of cervical spinal cord (HCC) 11/29/2017   Hypertension    Protein-calorie malnutrition, severe (HCC) 01/24/2018   S/P insertion of IVC (inferior vena caval) filter 01/24/2018   Tetraparesis (HCC)    Past Surgical History:  Procedure Laterality Date   ANTERIOR CERVICAL DECOMP/DISCECTOMY FUSION N/A 11/29/2017   Procedure: Cervical five-six, six-seven Anterior Cervical Decompression Fusion;  Surgeon: Cannon Champion, MD;  Location: [facility] OR;  Service: Neurosurgery;  Laterality: N/A;  Cervical five-six, six-seven Anterior Cervical Decompression Fusion   CATARACT EXTRACTION     FEMUR IM NAIL Left 08/16/2021   Procedure: INTRAMEDULLARY (IM) NAIL FEMORAL;  Surgeon: Marlynn Singer, MD;  Location: [facility] ORS;  Service: Orthopedics;  Laterality: Left;   IR IVC FILTER PLMT / S&I /IMG GUID/MOD SED  12/08/2017   Patient Active Problem List   Diagnosis Date Noted   Age-related osteoporosis without current pathological fracture 07/27/2022   Mild recurrent major depression (HCC) 07/27/2022   Hypertensive heart disease with other congestive heart failure (HCC) 02/14/2022   Chronic indwelling Foley catheter 02/14/2022   Closed hip fracture (HCC)  08/15/2021   DVT (deep venous thrombosis) (HCC) 08/15/2021   Quadriplegia (HCC) 08/15/2021   Fall 08/15/2021   Constipation due to slow transit 08/31/2018   Trauma 06/05/2018   Neuropathic pain 06/05/2018   Neurogenic bowel 06/05/2018   Vaginal yeast infection 01/30/2018   Healthcare-associated pneumonia 01/25/2018   Chronic allergic rhinitis 01/24/2018   Depression with anxiety 01/24/2018   UTI due to Klebsiella species 01/24/2018   Protein-calorie malnutrition, severe (HCC) 01/24/2018   S/P insertion of IVC (inferior vena caval) filter 01/24/2018   Tetraparesis (HCC) 01/20/2018   Neurogenic bladder 01/20/2018   Reactive depression    Benign essential HTN    Acute postoperative anemia due to expected blood loss    Central cord syndrome at C6 level of cervical spinal cord (HCC) 11/29/2017   Allergy to alpha-gal 11/25/2016   SCC (squamous cell carcinoma) 04/08/2014   ONSET DATE: 11/29/2017  REFERRING DIAG: Central Cord Syndrome at C6 of the Cervical Spinal Cord, Fall with Bilateral Closed Hip Fractures, with ORIF repair with Intramedullary Nailing of the Left Hip Fracture.   THERAPY DIAG:  Muscle weakness (generalized)  Other lack of coordination  Rationale for Evaluation and Treatment: Rehabilitation  SUBJECTIVE:  SUBJECTIVE STATEMENT:  Pt. reports doing okay today.   Pt accompanied by: self, Personal Care aide  PERTINENT HISTORY: Central Cord Syndrome at C6 of the Cervical Spinal Cord, Fall with  Bilateral Closed Hip Fractures, with ORIF repair with Intramedullary Nailing of the Left Hip Fracture.   PRECAUTIONS: None  WEIGHT BEARING RESTRICTIONS: No  PAIN:  Are you having pain? No pain   LIVING ENVIRONMENT: Lives with: [facility] Stairs: No Has following equipment at home: Wheelchair (power)  PLOF: Independent   PATIENT GOALS:  See below for established goals  OBJECTIVE:  Note: Objective measures were completed at Evaluation unless  otherwise noted.  HAND DOMINANCE: Right  ADLs: Caregiver assist with ADLs, and [facility]  MOBILITY STATUS: Uses a power w/c  ACTIVITY TOLERANCE: Activity tolerance: Fair  FUNCTIONAL OUTCOME MEASURES:  Measurements:   12/09/2021:   Shoulder flexion: R: 120(136), L: 138(150) Shoulder abduction: R: 108(120), L: 110(120) Elbow: R: 0-148, L: 0-146 Wrist flexion: R: 55(62), L: 78 Writ extension: R: 34(55), L: 3(4) RD: R: 12(20), L: 16(26) UD: R: 8(24), L: 6(18) Thumb radial abduction: R: 11(20), L: 8(20) Digit flexion to the [facility]: R:  2nd: 5cm(3.5cm), 3rd: 7.5 cm(5cm), 4th: 7cm(3cm), 5th: 6cm(5.5cm) L: 2nd: 8cm(8cm), 3rd: 8cm(8cm), 4th: 7.5cm(7cm), 5th: 7cm(7cm)   02/03/2022:   Shoulder flexion: R: 122(138), L: 148(154) Shoulder abduction: R: 109(125), L: 125(125 Elbow: R: 0-148, L: 0-146 Wrist flexion: R: 60(68), L: 79 Writ extension: R: 40(55), L: 3(10) RD: R: 14(24), L: 20(28) UD: R: 8(24), L: 6(18) Thumb radial abduction: R: 20(25), L: 8(20) Digit flexion to the [facility]: R:  2nd: 4.5cm(3cm), 3rd: 6.5cm(4.5cm), 4th: 6.5 cm(3cm), 5th: 4.5cm(5cm) L: 2nd: 8cm(8cm), 3rd: 8cm(7cm), 4th: 7.5cm(7cm), 5th: 6.5cm(6.5cm)   04/07/2022:   Shoulder flexion: R: 125(150), L: 160(160) Shoulder abduction: R: 109(125), L: 125(125 Elbow: R: 0-148, L: 0-146 Wrist flexion: R: 60(68), L: 79 Writ extension: R: 40(55), L: 5(18) RD: R: 14(24), L: 20(28) UD: R: 18(24), L: 8(18) Thumb radial abduction: R: 20(25), L: 8(20) Digit flexion to the [facility]: R:  2nd: 4.5 cm(4 cm), 3rd: 6.5cm(3cm), 4th: 7 cm (5cm), 5th: 5cm(5cm) L: 2nd: 8cm(8cm), 3rd: 8cm(7cm), 4th: 7.5cm(7cm), 5th: 7cm(7cm)   07/05/2022:   Shoulder flexion: R: 125(154), L: 160(160) Shoulder abduction: R: 95(130), L: 143(143) Elbow: R: 0-148, L: 0-146 Wrist flexion: R: 60(68), L: 79 Writ extension: R: 40(60), L: 0(12) RD: R: 10(24), L: 12(20) UD: R: 16(24), L: 8(16) Thumb radial abduction: R: 20(25), L: 8(20) Digit flexion to  the [facility]: R:  2nd: 4 cm(4cm), 3rd: 6.5cm( 3.5cm), 4th: 7 cm (5cm), 5th: 6cm(5cm) L: 2nd: 8cm(8cm), 3rd: 8cm(7cm), 4th: 8cm(7cm), 5th: 7cm(7cm)   09/13/2022:   Shoulder flexion: R: 125(154), L: 160(160) Shoulder abduction: R: 100(130), L: 143(143) Elbow: R: 0-150, L: 0-150 Wrist flexion: R: 55(68), L: 79 Writ extension: R: 45(60), L: 0(12) RD: R: 10(24), L: 12(20) UD: R: 16(24), L: 8(16) Thumb radial abduction: R: 20(25), L: 8(20) Digit flexion to the [facility]: R:  2nd: 4 cm(4cm), 3rd: 6cm( 5cm), 4th: 6 cm (4cm), 5th: 5cm(5cm) L: 2nd: 7cm(cm), 3rd: 7cm(6cm), 4th: 7cm(6cm), 5th: 6cm(6cm)   10/27/2022   Shoulder flexion: R: 128(154), L: 160(160) Shoulder abduction: R: 105(130), L: 143(143) Elbow: R: 0-150, L: 0-150 Wrist flexion: R: 55(68), L: 79 Writ extension: R: 46(64), L: 0(20) RD: R: 10(20), L: 18(24) UD: R: 18(28), L: 8(20) Thumb radial abduction: R: 30(42), L:20(24) Digit flexion to the [facility]: R:  2nd: 4 cm(3cm), 3rd: 6cm( 6cm), 4th: 6 cm (5cm), 5th: 5cm(5cm) L: 2nd: 8cm(8cm), 3rd: 8cm(6cm), 4th: 8cm(7cm), 5th: 6cm(6cm)  01/19/2023  Shoulder flexion: R: 131(154), L: 160(160) Shoulder abduction: R: 110(130), L: 143(143) Elbow: R: 0-150, L:  0-150 Wrist flexion: R: 55(68), L: 79 Writ extension: R: 46(64), L: 0(20) RD: R: 10(20), L: 18(24) UD: R: 18(28), L: 14(20) Thumb radial abduction: R: 38(48), L:24(26) Thumb IP flexion: R: 35(50) L: 20(45) Digit flexion to the [facility]: R:  2nd: 3.5 cm(3cm), 3rd: 6cm( 6cm), 4th: 6 cm (5cm), 5th: 5cm(4.5cm) L: 2nd: 8cm(7.5cm), 3rd: 8cm(6cm), 4th: 7.5cm(6cm), 5th: 6cm(4.5cm)  04/04/2023  Shoulder flexion: R: 134(154), L: 160(160) Shoulder abduction: R: 114(130), L: 143(143) Elbow: R: WNL, L: WNL Wrist flexion: R: 55(68), L: 80 Writ extension: R: 46(64), L: -10(10) RD: R: 14(24), L: 20(24) UD: R: 20(30), L: 14(20) Thumb radial abduction: R: 38(48), L:28(30) Thumb IP flexion: R: 40(50) L: 25(45) Digit flexion to the [facility]: R:  2nd: 3.5  cm(3cm), 3rd: 6cm( 5cm), 4th: 6 cm (4cm), 5th: 5cm(5cm) L: 2nd: 8cm(7.5cm), 3rd: 8cm(7cm), 4th: 7.5cm(7cm), 5th: 7cm(6cm)  05/25/2023  Shoulder flexion: R: 134(154), L: 160(160) Shoulder abduction: R: 117(134), L: 143(143) Elbow: R: WNL, L: WNL Wrist flexion: R: 55(68), L: 80 Wrist extension: R: 46(64), L: -8(14) RD: R: 14(24), L: 20(24) UD: R: 20(32), L: 14(22) Thumb radial abduction: R: 38(50), L:28(30) Thumb IP flexion: R: 40(50) L: 30(45) Digit flexion to the [facility]: R:  2nd: 3.5 cm(3cm), 3rd: 6cm( 5cm), 4th: 7 cm (4cm), 5th: 5cm(4cm) L: 2nd: 8cm(7.5cm), 3rd: 8cm(7cm), 4th: 7.5cm(7cm), 5th: 7cm(6cm)  07/13/23  Shoulder flexion: R: 134(154), L: 160(160) Shoulder abduction: R: 122(134), L: 143(143) Elbow: R: WNL, L: WNL Wrist flexion: R: 55(68), L: 80 Wrist extension: R: 50(64), L: -4(18) RD: R: 18(24), L: 24(24) UD: R: 20(32), L: 14(22) Thumb radial abduction: R: 38(50), L:3230) Thumb IP flexion: R: 42(50) L: 30(45) Digit flexion to the [name]: R:  2nd: 3.5 cm(3cm), 3rd: 6cm( 5cm), 4th: 7 cm (4cm), 5th: 5cm(4cm) L: 2nd: 8cm(7.5cm), 3rd: 8cm(7cm), 4th: 7.5cm(7cm), 5th: 7cm(6cm)   COORDINATION:   01/19/2023:  9 hole peg test:  Right: 2 pegs placed in 1 min. & 5 sec.  Pt. was able to remove 9 pegs in 17 sec.  Left: 2 pegs placed in 1 min. & 10 sec. Pt. was able to remove 9 pegs in 20 sec.  04/04/23:  Pt. was able to consistently grasp the pegs however each one slipped out of her fingers when attempting to place them into the Pegboard.  05/25/2023:   Right: 9 pegs placed and removed in 2 min. & 41 sec. Left: Unable to grasp the pegs for a horizontal position.  07/13/23    Right: 9 pegs placed and removed in 2 min. & 11 sec.   SENSATION: Intact  EDEMA: N/A   COGNITION: Overall cognitive status: WNL   VISION: Subjective report: Wears glasses, No changes in vision.  PERCEPTION: Intact  TODAY'S TREATMENT:                                                                                                                               DATE:  07/13/23  Measurements  were obtained, and goals were reviewed with the Pt.  Assessed multiple types of reachers with the Pt. Assessed right  hand position on the reacher with an adaption made to the trigger when attempting to use it to grasp items off the floor.    PATIENT EDUCATION: Education details: Bilateral hand and shoulder ROM, and reacher use Person educated: Patient Education method: Explanation, Demonstration, and Verbal cues Education comprehension: verbalized understanding and returned demonstration  HOME EXERCISE PROGRAM: Continue to assess HEP needs, and provide as needed.   GOALS: Goals reviewed with patient? Yes  LONG TERM GOALS: Target date: 10/05/2023  3.  Patient will independently use a reacher to pick up items from various surfaces.  Baseline: 07/13/2023: Pt. Is starting to use a reacher to retrieve items with an adaptation/modification to the trigger.05/25/2023:  Pt. is now able to initiate grasping a reacher to open and close the reacher with the right hand, however requires the grasp handle to be repositioned  within the hand. Pt. requires work on using/moving the reacher with the right hand through various planes. 04/04/2023: Pt. continues to work on improving Digit flexion to the [facility] to be able to efficiently handle Adaptive equipment. 01/19/2023: Pt. Is improving with Bilateral digit flexion to the [facility]. Pt. has difficulty firmly holding adaptive devices. 12/06/2022: Pt. continues to present with increased MP, PIP, and DIP digit tightness/stiffness limiting the formulation of bilateral composites fists. 10/27/2022: Pt. presents with increased MP, PIP, and DIP digit tightness/stiffness limiting the formulation of bilateral composites fists during this progress reporting period limiting. Digit flexion to the [facility]: R:  2nd: 4 cm(3cm), 3rd: 6cm( 6cm), 4th: 6 cm (5cm), 5th: 5cm(5cm), L: 2nd: 8cm(8cm), 3rd:  8cm(6cm), 4th: 8cm(7cm), 5th: 6cm(6cm) 09/20/2022: Improving digit flexion to the [facility] 09/13/22: improved digit flexion to the [facility] 07/05/2022: Pt. presents with digit MP, PIP, and DIP extensor tightness limiting her ability to securely grip objects in her bilateral hands.  04/07/2022: Pt. Has improved with right 2nd, and 3rd digit flexion towards the [facility]. Pt. Continues to have difficulty securely holding and applying deodorant.02/03/2022: Pt. Has improved with digit flexion, however, continues to have difficulty securely holding and using deodorant. 12/09/2021: Bilateral hand/digit MP, PIP, and DIP extension tightness limits her ability achieve digit flexion to hold, and apply deodorant. 10/28/2021:  Pt. continues to have difficulty holding the deodorant. 01/12/2021: Pt. Presents with limited digit extension. Pt. Is able to initiate holding deodorant, however is unable to hold it while using it  Goal status: Ongoing   4.  Pt. Will improve bilateral wrist extension in preparation for anticipating, and initiating reaching for objects at the table.  Baseline: 07/13/23:  Wrist extension: R: 50(64), L: -4(18) 05/25/23: Wrist extension: R: 46(64), L: -8(14) 04/04/23: R: 46(64) L: -10(10)01/19/2023:  R: 46(64) L: 0(20) 12/06/2022: Pt continues to to progress with bilateral wrist extension in preparation fro functional reaching. 10/27/2022: Pt. Is improving with bilateral wrist extension. R: 46(64) L: 0(20) 09/20/2022: limited PROM in the Left wrist extension 2/2 tightness/stiffness. 09/13/22: R: 55(68), L: 0(12) 07/19/2022: Bilateral wrist extension in limited. 07/05/2022: Right: 40(60) Left: 0(12) 04/07/2022: Right: 40(55) Left: 5(18) 02/03/2022: Right: 34(55) Left: 3(4) 12/09/2021: Right  34(55)  Left  3(4) 10/28/2021:  Right 22(38), Left 0(15) 09/14/2021:  Right: 17(35), left 2(15)  Goal status: Progressing/Ongoing  7.  Pt. Will increase bilateral lateral pinch strength by 2 lbs to be able to securely grasp items during ADLs, and  IADL tasks.  Baseline: 01/19/2023: Deferred as the Pinch meter  is out for calibration.12/06/2022: Pt. Is able to more securely hold objects during ADLs/IADLs 10/27/2022: Pt. Is improving with holding items with her bilateral thumbs. (Pinch meter out for calibration) 09/20/2022: lateral pinch continues to be limited 09/13/22: R 6.5# L 4# 07/19/2022: TBD 07/05/2022: TBD 07/2022: NT-Pinch meter out for calibration. 02/03/2022: Right: 6#, Left: 4# 12/09/2021: Right: 6#, Left: 4#  Goal status: Deferred   9.  Pt. will complete plant care with modified independence.  Baseline:12/06/2022: Pt. Is independent with plant care in her husband's room, however is not able to access her plants due to furniture blocking access to them. 10/27/2022: Pt. Is able to water plants that are closer, and within reach. Pt. continues to have difficulty reaching for thorough plant care. 09/20/2022: Pt. Continues to have difficulty reaching for thorough plant care. 09/13/22: continues to report intermittent difficulty 07/19/2022: Pt. Continues to be able to water, and care for some of her plants. Pt. Has more difficulty with plants that are harder to reach. 07/05/2022: pt. Is able to water, and care for some of her plants. Pt. Has more difficulty with plants that are harder to reach. 04/07/2022: Pt. is now able to hold a cup and water her plants.02/03/2022: Pt. has difficulty caring for her plants.  Goal status:Achieved   10.  Pt. will demonstrate adaptive techniques to assist with the efficiency of self-dressing, or morning care tasks.  Baseline: 07/13/23:Pt. continues to work on improving UE functioning to be able to efficiently use Adaptive equipment 05/25/2023: Pt. continues to work on improving UE functioning to be able to efficiently use Adaptive equipment.  04/04/2023:  Pt. continues to work on improving UE functioning to be able to efficiently use Adaptive equipment. 01/19/2203: Pt. Is able to donn her jacket in reverse independently. Pt.  requires assist from staff, as Pt. Reports staff have decreased time. 12/06/2022: Pt. requires assist from staff, as Pt. Reports staff have decreased time. 10/27/2022: Pt. is able to assist with initiating UE dressing. Pt. requires assist from personal, and staff care aides. 09/20/2022: Pt. continues to require assist form personal, and staff care aides. 09/13/22: MODA dressing, reports she is often rushed 07/19/2022: Pt. continues to require assist with self-dressing/morning care tasks. 07/05/2022: Continue with goal. 04/07/2022: Pt. Continues to require assist with the efficiency of self-dressing, and morning care tasks.02/03/2022: Pt. requires assist from caregivers 2/2 time limitations during morning care.  Goal status: Ongoing  11.  Pt. will improve BUE strength to improve ADL, and IADL functioning.  Baseline: 07/13/23: shoulder flexion L 4/5, R 4-/5; shoulder abduction L 4+/5, R 4-/5. elbow flexion B 5/5, elbow extension 4+/5 01/19/2023: L 4/5, R 3+/5  05/25/2023: shoulder flexion L 4/5, R 4-/5; shoulder abduction L 4+/5, R 4-/5. elbow flexion B 5/5, elbow extension 4+/5 01/19/2023: L 4/5, R 3+/5  04/04/2023: shoulder flexion L 4/5, R 3+/5; shoulder abduction L 4+/5, R 3+/5. elbow flexion B 5/5, elbow extension 4/5 01/19/2023: L 4/5, R 3+/5 12/06/2022: Continue 10/27/2022: shoulder flexion L 4/5, R 3+/5; shoulder abduction L 4+/5, R 3+/5. elbow flexion B 5/5, elbow extension 4/5 09/20/2022: Pt. Presents with limited BUE strength 09/13/22: shoulder flexion L 4/5, R 3/5; elbow flexion B 5/5, elbow extension 4-/5; shoulder abduction L 4+/5, R 3+/5. 07/19/2022: BUE strength continues to be limited. 07/05/2022: shoulder flexion: right 4-/5, abduction: 3+/5, elbow flexion: right: 5/5, left 5/5, extension: right: 4-/5, left 4-/5, wrist extension: right: 3-/5, left: 2-/5  Goal status: Ongoing  12.  Pt. will improve bilateral thumb radial abduction in  order to be able to hold the grab bars while standing with PT  Baseline:  07/13/23: Pt. Is improving with holding the grab bars with the right hand .05/25/23: Thumb radial abduction: R: 38(50), L:28(30) 04/04/23: R: 38(48), L:28(30) 01/19/2023: R: 38(48) L: 24(26)12/06/2022: Pt. Continues to present  with limited thumb abduction, however is improving holding onto the parallel bars.10/27/2022: thumb radial abduction: R: 30(42), L: 20(24  Goal status: Ongoing  13.  Pt. Will be able to securely hold, and stabilize medication bottles at a tabletop surface when opening, and closing them.  Baseline: 01/19/2023: Pt. Is now able to securely hold medication bottles while opening them, and stabilizes them on surfaces while opening them. 12/06/2022: Pt is improving with securely stabilizing medication bottles. 10/27/2022: Pt. Stabilizes bottles against her torso when attempting to open, and close them   Goal status: Achieved  14: Pt. Will improve bilateral thumb IP flexion to improve active grasping patterns.    Baseline: 07/13/23: Thumb IP flexion: R: 42(50) L: 30(45) 05/25/2023: Thumb IP flexion: R: 40(50) L: 30(45) 04/04/2023:  R: 40(50) L: 25(45) 01/19/2023: Right 35(50), Left: 20/45)    Goal status: Ongoing    15. Pt. Will improve bilateral FMC/speed, and dexterity. As evidence by improved scores on the 9 hole peg test.  Baseline: 07/13/2023: Right: 9 pegs placed and removed in 2 min. & 11 sec. 05/25/2023: 9 Hole pegs test speed/dexterity: Right: 9 pegs placed and removed in 2 min. & 41 sec. Left: Unable to grasp the pegs for a horizontal position. 04/04/23: Pt. was able to consistently grasp the pegs however each one slipped out of her fingers when attempting to place them into the Pegboard. 01/19/2023: 2 pegs placed in 1 min. & 5 sec.  Pt. was able to remove 9 pegs in 17 sec.; Left: 2 pegs placed in 1 min. & 10 sec. Pt. was able to remove 9 pegs in 20 sec.   Goal status: Ongoing  ASSESSMENT:  CLINICAL IMPRESSION:  Measurements were obtained, and goals were reviewed with the Pt. Pt.  has made progress with right hand [facility] skills by 30 sec. Of speed since the last assessment period. Pt. Has progress with UE ROM with increased ROM in left wrist extension PROM closer to neutral. Pt. has made progress overall. Pt. continues to make progress with bilateral hand function skills and is engaging then right hand more during daily ADL, and IADL tasks. Pt. is now using the reacher with a trigger modification to retrieve items, however continues to work on efficiency with this in order to be able to use the reacher to retrieve items from multiple surfaces, and and for LE ADLs. Pt. continues to tolerate ROM well. Pt. continues to benefit from OT services to work on impoving ROM, and UE strength in order to work towards increasing bilateral hand grasp on objects, and increasing engagement of her bilateral hands during ADLs, and IADL tasks.     PERFORMANCE DE PERFORMANCE DEFICITS: in functional skills including ADLs, IADLs, coordination, dexterity, ROM, strength, and UE functional use, cognitive skills including , and psychosocial skills including coping strategies and environmental adaptation.   IMPAIRMENTS: are limiting patient from ADLs, IADLs, and leisure.   CO-MORBIDITIES: may have co-morbidities  that affects occupational performance. Patient will benefit from skilled OT to address above impairments and improve overall function.  MODIFICATION OR ASSISTANCE TO COMPLETE EVALUATION: Min-Moderate modification of tasks or assist with assess necessary to complete an evaluation.  OT OCCUPATIONAL PROFILE AND HISTORY: Detailed assessment: Review  of records and additional review of physical, cognitive, psychosocial history related to current functional performance.  CLINICAL DECISION MAKING: Moderate - several treatment options, min-mod task modification necessary  REHAB POTENTIAL: Good for stated goals  PLAN:  OT FREQUENCY 2x's a week  OT DURATION: 12 weeks  PLANNED INTERVENTIONS ADL  training, A/E training, UE ther. Ex, Manual therapy, neuromuscular re-education, moist heat modality, Paraffin Bath, Splinting, and  Pt./caregiver education   RECOMMENDED OTHER SERVICES: PT  CONSULTED AND AGREED WITH PLAN OF CARE: Patient  PLAN FOR NEXT SESSION: Continue Treatment as per established POC  Duey Ghent, MS, OTR/L   07/13/23, 12:26 PM

## 2023-07-13 NOTE — Therapy (Signed)
 OUTPATIENT PHYSICAL THERAPY NEURO TREATMENT/Physical Therapy Progress Note   Dates of reporting period  05/23/2023   to   07/13/2023    Patient Name: Sherry [name] MRN: [id number] DOB:[birth year], 89 y.o., female Today's Date: 07/13/2023   PCP: Valrie Gehrig, MD REFERRING PROVIDER: Marlynn Singer   PT End of Session - 07/13/23 1123     Visit Number 120    Number of Visits 139    Date for PT Re-Evaluation 09/12/23    Progress Note Due on Visit 120    PT Start Time 1145    PT Stop Time 1218    PT Time Calculation (min) 33 min    Equipment Utilized During Treatment Gait belt    Activity Tolerance Patient tolerated treatment well;No increased pain    Behavior During Therapy WFL for tasks assessed/performed                           Past Medical History:  Diagnosis Date   Acute blood loss anemia    Arthritis    Cancer (HCC)    skin   Central cord syndrome at C6 level of cervical spinal cord (HCC) 11/29/2017   Hypertension    Protein-calorie malnutrition, severe (HCC) 01/24/2018   S/P insertion of IVC (inferior vena caval) filter 01/24/2018   Tetraparesis (HCC)    Past Surgical History:  Procedure Laterality Date   ANTERIOR CERVICAL DECOMP/DISCECTOMY FUSION N/A 11/29/2017   Procedure: Cervical five-six, six-seven Anterior Cervical Decompression Fusion;  Surgeon: Cannon Champion, MD;  Location: [facility] OR;  Service: Neurosurgery;  Laterality: N/A;  Cervical five-six, six-seven Anterior Cervical Decompression Fusion   CATARACT EXTRACTION     FEMUR IM NAIL Left 08/16/2021   Procedure: INTRAMEDULLARY (IM) NAIL FEMORAL;  Surgeon: Marlynn Singer, MD;  Location: [facility] ORS;  Service: Orthopedics;  Laterality: Left;   IR IVC FILTER PLMT / S&I /IMG GUID/MOD SED  12/08/2017   Patient Active Problem List   Diagnosis Date Noted   Age-related osteoporosis without current pathological fracture 07/27/2022   Mild recurrent major depression (HCC) 07/27/2022    Hypertensive heart disease with other congestive heart failure (HCC) 02/14/2022   Chronic indwelling Foley catheter 02/14/2022   Closed hip fracture (HCC) 08/15/2021   DVT (deep venous thrombosis) (HCC) 08/15/2021   Quadriplegia (HCC) 08/15/2021   Fall 08/15/2021   Constipation due to slow transit 08/31/2018   Trauma 06/05/2018   Neuropathic pain 06/05/2018   Neurogenic bowel 06/05/2018   Vaginal yeast infection 01/30/2018   Healthcare-associated pneumonia 01/25/2018   Chronic allergic rhinitis 01/24/2018   Depression with anxiety 01/24/2018   UTI due to Klebsiella species 01/24/2018   Protein-calorie malnutrition, severe (HCC) 01/24/2018   S/P insertion of IVC (inferior vena caval) filter 01/24/2018   Tetraparesis (HCC) 01/20/2018   Neurogenic bladder 01/20/2018   Reactive depression    Benign essential HTN    Acute postoperative anemia due to expected blood loss    Central cord syndrome at C6 level of cervical spinal cord (HCC) 11/29/2017   Allergy to alpha-gal 11/25/2016   SCC (squamous cell carcinoma) 04/08/2014    ONSET DATE: 08/15/2021 (fall with B hip fx); Initial injury was in 2019- quadraplegia due to central cord syndrome of C6 secondary to neck fracture.   REFERRING DIAG: Bilateral Hip fx; Left Open- s/p Left IM nail femoral on 08/16/2021; Right closed  THERAPY DIAG:  Muscle weakness (generalized)  Central cord syndrome, sequela (HCC)  Other lack of coordination  Other abnormalities of gait and mobility  Difficulty in walking, not elsewhere classified  Abnormality of gait and mobility  Unsteadiness on feet  Closed fracture of both hips, initial encounter [facility])  Rationale for Evaluation and Treatment Rehabilitation  SUBJECTIVE:   SUBJECTIVE STATEMENT:   Patient reports doing okay without complaints of pain. States she feels her knee is bowing in more these days.         Pt accompanied by: Paid caregiver-   PERTINENT HISTORY: Sherry [name] is an  89yoF who experienced a fall at home on 08/15/2021 with hip fracture, s/p Left hip ORIF. PMH: quadriplegia due to central cord syndrome of C6 secondary to neck fracture, HTN, depression with anxiety, neurogenic bladder, bilateral DVT on Xarelto  (s/p of IVC filter placement). Prior history of significant PT/OT with improvement of function. Pt has full use of shoulder/elbows, limited use of hands, adaptive self feeding sucessful.     PAIN:  None current but does endorse some left thigh pain intermittent  OBJECTIVE:   TODAY'S TREATMENT:   07/13/23     Therapeutic Activities: dynamic therapeutic activities designed to achieve improved functional performance -all activities performed in chair today.  Sit to stand Max assist in // bars. Mod assist to position feet in place. Patient able to slide forward. Max assist to stand x several sets today- Blocked knees and positioned left knee into more neutral position- Tendency to excessive valgus.  Static standing in // bars -  3 trials - all over 1 min but less than 2 min with initial trial the most difficult. Increased overall left knee hyperextension and difficulty with anterior lean forward.           PATIENT EDUCATION: Education details: Exercise technique Patient verbalized understanding.   HOME EXERCISE PROGRAM: No changes at this time  Access Code: [facility] URL: [email or web address] Date: 11/23/2021 Prepared by: Marina  Moser Exercises - Seated Gluteal Sets - 2 x daily - 7 x weekly - 2 sets - 10 reps - 5 hold - Seated Quad Set - 2 x daily - 7 x weekly - 2 sets - 10 reps - 5 hold - Seated Long Arc Quad - 2 x daily - 7 x weekly - 2 sets - 10 reps - 5 hold - Seated March - 2 x daily - 7 x weekly - 2 sets - 10 reps - 5 hold - Seated Hip Abduction - 2 x daily - 7 x weekly - 2 sets - 10 reps - 5 hold - Seated Shoulder Shrugs - 2 x daily - 7 x weekly - 2 sets - 10 reps - 5 hold - Wheelchair Pressure Relief - 2 x daily - 7 x weekly  - 2 sets - 10 reps - 5 hold     GOALS: Goals reviewed with patient? Yes  SHORT TERM GOALS: Target date: 02/22/2022  Pt will be independent with initial UE strengthening HEP in order to improve strength and balance in order to decrease fall risk and improve function at home and work. Baseline: 10/19/2021- patient with no formal UE HEP; 12/02/2021= Patient verbalized knowledge of HEP including use of theraband for UE strengthening.  Goal status: GOAL MET  LONG TERM GOALS: Target date: 09/12/2023   Pt will be independent with final for UE/LE HEP in order to improve strength and balance in order to decrease fall risk and improve function at home and work. Baseline: Patient is currently BLE NWB and unable to participate in HEP. Has order for UE strengthening. 01/11/2022-  Patient now able to participate in LE strengthening although NWB still- good understanding for some basic exercises. Will keep goal active to incorporate progressive LE strengthening exercises. 04/07/2022= Patient still participating in progressive LE seated HEP and has no questions at this time.  Goal status: MET  2.  Pt will improve FOTO to target score of 35  to display perceived improvements in ability to complete ADL's.  Baseline: 10/19/2021= 12; 12/02/2021= 17; 01/11/2022=17; 04/07/2022= 17; 07/05/2022=17; 10/06/2022- outcome measure not appropriate as patient in non-ambulatory and not consistently standing. Goal status: Goal not appropriate  3.  Pt will increase strength of B UE  by at least 1/2 MMT grade in order to demonstrate improvement in strength and function  Baseline: patient has range of 2-/5 to 4/5 BUE Strength; 12/02/2021= 4/5 except with wrist ext. 01/11/2022= 4/5 B UE strength expept for wrist Goal status: Goal revised-no longer appropriate- working with OT on all UE strengthening.   4. Pt. Will increase strength of RLE by at least 1/2 MMT grade in order to demonstrate improvement in Standing/transfers.  Baseline: 2-/5  Right hip flex/knee ext/flex; 04/07/2022= 2- with right hip flex/knee ext (lacking 28 deg from zero) 4/8= Patient able to ext right knee lacking 18 deg from zero. 07/05/2022=Left knee approx 8 deg from zero and right knee = 23 deg from zero; 12/29/2022 - Left LE = full ROM 4-/5 and right knee ext= 23 deg from zero= 2+/5 (gravity minimized); 03/28/2023= Left knee ext=  4/5 and right knee ext= 3-/5 (able to move lower leg through incomplete ROM -lacking 20 deg from full ROM against gravity); 05/23/2023= right knee ext= 3-/5 (able to move lower leg through incomplete ROM -lacking 17 deg from full ROM against gravity); 06/20/2023= Right knee ext= lacking 20 deg from zero  Goal status: Progressing  5. Pt. Will demo ability to stand pivot transfer with max assist for improved functional mobility and less dependent need on mechanical device.   Baseline: Dependent on hoyer lift for all transfers; 04/07/2022- Unable to test secondary to patient with recent UTI and right hand procedure - will attempt to reassess next visit. ; 04/12/22: Unable to successfully stand due to feet plates on loaned power w/c; 05/10/2022- Patient still having to use rental chair and has not received her original power w/c back to practice SPT. 07/04/2021= Patient just received new power w/c and attempted standing from 1st time today- Patient able to stand with max assist today from w/c and did not attempt to perform pivot transfer- difficulty with static standing and placing weight on right LE. 10/06/2022- Not assessed today- patient needs new AFO prior to attempting transfers; 12/29/2022= Patient has not attempted SPT yet - still trying to improve LE strength enough to stand well without knee buckling with weight shifting. 03/28/2023- not attempted today due to ill fitting brace but patient last performed SPT on 01/10/23 and was able to perform with Max A with feet pre-positioned to angle toward direction of transfer; 05/23/2023- Not tested today but on last  visit- patient able to perform SPT with max assist yet concern for twisting knee as she was unable to turn/pivot foot .  06/20/2023- deferred to next session due to patient fatigued with static standing today.  Goal status: ONGOING  6. Pt. Will demonstrate improved functional LE strength as seen by ability to stand > 2 min for improved transfer ability and pregait abilities.   Baseline: not assessed today due to recent dx: UTI; 04/07/2022- Unable to test secondary to  patient with recent UTI and right hand procedure - will attempt to reassess next visit; 04/12/22: Unable to successfully stand due to feet plates on loaned power w/c 05/10/2022- Patient still having to use rental chair and has not received her original power w/c back to practice Standing. 07/05/2022- Patient able to stand with max assist at trunk today for 2 min 3 sec. Will keep goal active to ensure consistency. 10/06/2022= Unable to assess today- patient with some recent blood pressure issues and needs new AFO. 11/24/2022= Patient demonstrated multiple rounds of standing in // bars- holding up to 1:45 min today but has been able to exhibit 2 min in recent past- working on consistency. 12/29/2022= 2 min 40 sec with patient demonstrating improved overall posture and ability to contract glutes and quads with just min assist at trunk today.   Goal status: MET  7. Pt. Will demonstrate improved functional LE strength as seen by ability to stand > 5 min for improved transfer ability and pregait abilities. Baseline: 12/29/2022= 2 min 40 sec with patient demonstrating improved overall posture and ability to contract glutes and quads with just min assist at trunk today. 03/28/2023= Attempted 2 trials today - at best 2 min 48 sec today but during cert- did stand for 3 min 44 sec. 06/20/2023= Patient able to stand 2x (45 sec and 1 min 12 sec)- max assist yet no slippage of LLE.  07/13/2023= Patient able to stand x 3 trials today- each 1-2 min focusing on posture-  patient with improving gluteal activation and less physical assist once in standing position. Goal Status: Ongoing  8. Patient will perform sit to stand transfer with moderate assist consistently > 75% of time for improved transfer ability and less dependence on caregiver.   Baseline: 12/29/2022- Patient currently max assist level assistance with all sit to stand activities. 03/28/2023= Patient is still majority of max assist to stand yet able to pull up from wheelchair smoother in one motion. 06/20/2023- Max Assist but has only most recently resumed standing since last week so goal still appropriate for new cert; 0/98/1191=YNWGN max assist but less overall assist on last trial of standing with improved forward lean and gluteal activation.   Goal status: ONGOING   ASSESSMENT:  CLINICAL IMPRESSION:   Patient reported feeling better and no pain with standing today. She was able to improve her standing posture- improved gluteal activation on 2nd and 3rd trial of standing and no slippage of left brace sliding forward today. Slow progress with recent return to standing but Patient's condition has the potential to improve in response to therapy. Maximum improvement is yet to be obtained. The anticipated improvement is attainable and reasonable in a generally predictable time.   Pt will continue to benefit from skilled PT services to optimize independence and reduced caregiver support.   OBJECTIVE IMPAIRMENTS Abnormal gait, decreased activity tolerance, decreased balance, decreased coordination, decreased endurance, decreased mobility, difficulty walking, decreased ROM, decreased strength, hypomobility, impaired flexibility, impaired UE functional use, postural dysfunction, and pain.   ACTIVITY LIMITATIONS carrying, lifting, bending, sitting, standing, squatting, sleeping, stairs, transfers, bed mobility, continence, bathing, toileting, dressing, self feeding, reach over head, hygiene/grooming, and caring for  others  PARTICIPATION LIMITATIONS: meal prep, cleaning, laundry, medication management, personal finances, interpersonal relationship, driving, shopping, community activity, and yard work  PERSONAL FACTORS Age, Time since onset of injury/illness/exacerbation, and 1-2 comorbidities: HTN, cervical Sx are also affecting patient's functional outcome.   REHAB POTENTIAL: Good  CLINICAL DECISION MAKING: Evolving/moderate complexity  EVALUATION  COMPLEXITY: Moderate  PLAN: PT FREQUENCY: 1-2x/week  PT DURATION: 12 weeks  PLANNED INTERVENTIONS: Therapeutic exercises, Therapeutic activity, Neuromuscular re-education, Balance training, Gait training, Patient/Family education, Self Care, Joint mobilization, DME instructions, Dry Needling, Electrical stimulation, Wheelchair mobility training, Spinal mobilization, Cryotherapy, Moist heat, and Manual therapy  PLAN FOR NEXT SESSION:   Continue with LE and core strength training  Sit to stand and static standing SPT as able.     1:50 PM, 07/13/23  1:50 PM, 07/13/23 Ossie Blend, PT Physical Therapist - [facility]  Outpatient Physical Therapy- Main Campus [phone]

## 2023-07-15 ENCOUNTER — Encounter: Payer: Self-pay | Admitting: Student

## 2023-07-15 ENCOUNTER — Non-Acute Institutional Stay (SKILLED_NURSING_FACILITY): Payer: Self-pay | Admitting: Student

## 2023-07-15 DIAGNOSIS — Z95828 Presence of other vascular implants and grafts: Secondary | ICD-10-CM

## 2023-07-15 DIAGNOSIS — G8250 Quadriplegia, unspecified: Secondary | ICD-10-CM

## 2023-07-15 DIAGNOSIS — K592 Neurogenic bowel, not elsewhere classified: Secondary | ICD-10-CM

## 2023-07-15 DIAGNOSIS — S14126S Central cord syndrome at C6 level of cervical spinal cord, sequela: Secondary | ICD-10-CM | POA: Diagnosis not present

## 2023-07-15 DIAGNOSIS — N319 Neuromuscular dysfunction of bladder, unspecified: Secondary | ICD-10-CM

## 2023-07-15 DIAGNOSIS — Z978 Presence of other specified devices: Secondary | ICD-10-CM | POA: Diagnosis not present

## 2023-07-15 DIAGNOSIS — I5089 Other heart failure: Secondary | ICD-10-CM

## 2023-07-15 DIAGNOSIS — I110 Hypertensive heart disease with heart failure: Secondary | ICD-10-CM | POA: Diagnosis not present

## 2023-07-15 NOTE — Progress Notes (Signed)
 Location:  [facility] Room Number: 219 A Place of Service:  SNF [phone]) Provider:  Abdul Fine, MD  Patient Care Team: Abdul Fine, MD as PCP - General (Family Medicine)  Extended Emergency Contact Information Primary Emergency Contact: [name],[name] Mobile Phone: [phone] Relation: Daughter Secondary Emergency Contact: Mathias Lynwood HERO Address: [address]          [place], [place] [address] [place] Home Phone: [phone] Mobile Phone: [phone] Relation: Spouse  Code Status:  DNR Goals of care: Advanced Directive information    03/09/2023   12:39 PM  Advanced Directives  Does Patient Have a Medical Advance Directive? Yes  Type of Estate agent of [place];Out of facility DNR (pink MOST or yellow form);Living will  Does patient want to make changes to medical advance directive? No - Patient declined  Copy of Healthcare Power of Attorney in Chart? Yes - validated most recent copy scanned in chart (See row information)     Chief Complaint  Patient presents with   Medical Management of Chronic Issues    Routine visit. Discuss need for annual wellness visit and pneumonia vaccine.   Late-entry note:  HPI:  Pt is a 89 y.o. female seen today for medical management of chronic diseases.   She states she is doing well at this time. Her new brace is doing well at this time. She continues to have PT multiple times a week.    Nursing wrote a request for patient's colon clean out as PRN.   Past Medical History:  Diagnosis Date   Acute blood loss anemia    Arthritis    Cancer (HCC)    skin   Central cord syndrome at C6 level of cervical spinal cord (HCC) 11/29/2017   Hypertension    Protein-calorie malnutrition, severe (HCC) 01/24/2018   S/P insertion of IVC (inferior vena caval) filter 01/24/2018   Tetraparesis (HCC)    Past Surgical History:  Procedure Laterality Date   ANTERIOR CERVICAL  DECOMP/DISCECTOMY FUSION N/A 11/29/2017   Procedure: Cervical five-six, six-seven Anterior Cervical Decompression Fusion;  Surgeon: Cheryle Debby LABOR, MD;  Location: [facility] OR;  Service: Neurosurgery;  Laterality: N/A;  Cervical five-six, six-seven Anterior Cervical Decompression Fusion   CATARACT EXTRACTION     FEMUR IM NAIL Left 08/16/2021   Procedure: INTRAMEDULLARY (IM) NAIL FEMORAL;  Surgeon: Cleotilde Barrio, MD;  Location: [facility] ORS;  Service: Orthopedics;  Laterality: Left;   IR IVC FILTER PLMT / S&I /IMG GUID/MOD SED  12/08/2017    Allergies  Allergen Reactions   Other Swelling   Lisinopril-Hydrochlorothiazide Swelling    Swelling of the tongue   Beef-Derived Drug Products Swelling    Facial and lip swelling (due to tick bite)   Dairy Aid [Tilactase] Swelling   Lisinopril-Hydrochlorothiazide Swelling    Tongue swelling   Pork-Derived Products Swelling    Outpatient Encounter Medications as of 07/15/2023  Medication Sig   acetaminophen  (TYLENOL ) 500 MG tablet Take 500 mg by mouth every 12 (twelve) hours as needed for mild pain.   acetaminophen  (TYLENOL ) 650 MG CR tablet Take 650 mg by mouth every 8 (eight) hours as needed for pain.   acetic acid 0.25 % irrigation Use 30ml via irrigation twice daily to keep foley Cath patent.   alendronate (FOSAMAX) 70 MG tablet Take 70 mg by mouth every Saturday.   calcium  carbonate (CALCIUM  600) 600 MG TABS tablet Take 600 mg by mouth at bedtime.   Capsaicin  0.1 % CREA Apply  1 application  topically every evening. (Apply to hands)   cetirizine (ZYRTEC) 5 MG tablet Take 5 mg by mouth 2 (two) times daily.   cholecalciferol  (VITAMIN D3) 25 MCG (1000 UNIT) tablet Take 1,000 Units by mouth daily.   DULoxetine  (CYMBALTA ) 60 MG capsule Take 60 mg by mouth daily.   ferrous sulfate  325 (65 FE) MG tablet Take 325 mg by mouth daily with breakfast.   fluocinonide (LIDEX) 0.05 % external solution Apply 1 Application topically every 12 (twelve) hours as needed.    gabapentin  (NEURONTIN ) 100 MG capsule Take 1 capsule (100 mg total) by mouth daily.   hydroxypropyl methylcellulose / hypromellose (ISOPTO TEARS / GONIOVISC) 2.5 % ophthalmic solution Place 1 drop into both eyes every hour as needed for dry eyes.   ketoconazole (NIZORAL) 2 % shampoo Apply 1 Application topically as directed. Every shift on Thursday for seborrheic dermatitis flare up   lactulose , encephalopathy, (CHRONULAC ) 10 GM/15ML SOLN Take 30 mLs by mouth daily as needed (mild constipation).   magnesium  citrate solution Take 60-120 mLs by mouth daily as needed (constipation).   Menthol , Topical Analgesic, (BIOFREEZE COOL THE PAIN) 4 % GEL Apply 1 application  topically every 8 (eight) hours as needed (for left knee pain).   methylcellulose oral powder Take 1 packet by mouth at bedtime.   Multiple Vitamin (MULTIVITAMIN WITH MINERALS) TABS tablet Take 1 tablet by mouth daily.   olopatadine (PATADAY) 0.1 % ophthalmic solution Place 1 drop into both eyes 2 (two) times daily.   polyethylene glycol (MIRALAX  / GLYCOLAX ) 17 g packet Take 17 g by mouth 2 (two) times daily. (Patient taking differently: Take 17 g by mouth daily as needed.)   polyethylene glycol (MIRALAX  / GLYCOLAX ) 17 g packet Take 17 g by mouth daily as needed.   rivaroxaban  (XARELTO ) 10 MG TABS tablet Take 10 mg by mouth daily.   saccharomyces boulardii (FLORASTOR) 250 MG capsule Take 250 mg by mouth daily.   Saline Spray 0.2 % SOLN Place 2 sprays into both nostrils as needed (nasal congestion).   simethicone  (MYLICON) 80 MG chewable tablet Chew 80 mg by mouth every 6 (six) hours as needed for flatulence.   sodium phosphate  (FLEET) 7-19 GM/118ML ENEM Place 1 enema rectally every other day as needed for severe constipation.   spironolactone  (ALDACTONE ) 25 MG tablet Take 0.5 tablets (12.5 mg total) by mouth daily.   tiZANidine  (ZANAFLEX ) 2 MG tablet Take 1 tablet (2 mg total) by mouth 2 (two) times daily.   triamcinolone  cream  (KENALOG ) 0.1 % Apply to thigh and lower legs topically every 8 hours as needed. Apply to back of neck topically as needed.   Infant Care Products [facility]) OINT Apply to buttocks/gluteal folds topically every shift. (Patient not taking: Reported on 07/15/2023)   mometasone  (ELOCON ) 0.1 % lotion Apply 1 Application topically every Saturday at 6 PM. (Apply to the ear canal for eczema) (Patient not taking: Reported on 07/15/2023)   Zinc Oxide (TRIPLE PASTE) 12.8 % ointment Apply 1 Application topically. Every shift. (Patient not taking: Reported on 07/15/2023)   No facility-administered encounter medications on file as of 07/15/2023.    Review of Systems  Immunization History  Administered Date(s) Administered   Influenza, High Dose Seasonal PF 11/17/2021   Influenza-Unspecified 11/14/2018, 11/20/2019, 11/17/2020, 11/17/2021, 11/24/2022   Moderna Covid-19 Fall Seasonal Vaccine 8yrs & older 05/11/2022, 05/13/2023   Moderna Covid-19 Vaccine Bivalent Booster 28yrs & up 06/30/2021, 12/11/2021, 10/29/2022   Moderna Sars-Covid-2 Vaccination 02/09/2019, 03/09/2019, 12/14/2019, 06/20/2020,  10/24/2020   PPD Test 05/16/2019   Tdap 11/29/2017   Zoster Recombinant(Shingrix) 09/02/2022, 12/04/2022   Pertinent  Health Maintenance Due  Topic Date Due   INFLUENZA VACCINE  09/02/2023   DEXA SCAN  Discontinued      08/19/2021   10:00 AM 08/19/2021    8:54 PM 08/20/2021    8:20 AM 03/29/2022   10:11 AM 08/31/2022    2:42 PM  Fall Risk  Falls in the past year?    0 0  Was there an injury with Fall?    0   Fall Risk Category Calculator    0   (RETIRED) Patient Fall Risk Level High fall risk  High fall risk  High fall risk     Patient at Risk for Falls Due to    No Fall Risks      Data saved with a previous flowsheet row definition   Functional Status Survey:    Vitals:   07/15/23 1156  BP: 127/77  Pulse: 72  Weight: 186 lb 6.4 oz (84.6 kg)  Height: 5' 7 (1.702 m)   Body mass index is 29.19  kg/m. Physical Exam Constitutional:      Comments: In wheelchair with limited mobility of upper extremities, lower extremity paralysis.   Cardiovascular:     Rate and Rhythm: Normal rate.  Pulmonary:     Effort: Pulmonary effort is normal.  Neurological:     Mental Status: She is alert and oriented to person, place, and time. Mental status is at baseline.     Labs reviewed: Recent Labs    11/04/22 0000 05/09/23 0000 06/03/23 0000  NA 139 140 136*  K 3.8 5.1 4.3  CL 100 101 99  CO2 32* 33* 32*  BUN 9 11 9   CREATININE 0.5 0.6 0.5  CALCIUM  8.8 9.3 8.9   Recent Labs    09/29/22 0000 05/09/23 0000  AST 20 21  ALT 14 16  ALKPHOS 58 59  ALBUMIN  3.6 3.6   Recent Labs    09/30/22 0000 05/09/23 0000  WBC 4.7 4.6  NEUTROABS 2,627.00 2,272.00  HGB 13.2 12.0  HCT 41 38  PLT 280 296   Lab Results  Component Value Date   TSH 1.31 09/29/2022   No results found for: HGBA1C No results found for: CHOL, HDL, LDLCALC, LDLDIRECT, TRIG, CHOLHDL  Significant Diagnostic Results in last 30 days:  No results found.  Assessment/Plan Hypertensive heart disease with other congestive heart failure (HCC)  Central cord syndrome at C6 level of cervical spinal cord, sequela (HCC)  Chronic indwelling Foley catheter  Neurogenic bladder  Neurogenic bowel  Tetraparesis (HCC)  S/P insertion of IVC (inferior vena caval) filter Patient is doing well. She does have periodic fluctuations in blood pressure. Multifaceted in nature including CHF and could be a level of autonomic dysregulation in relation to remote spinal cord injury. At this time has not needed additional medication but behavioral modification. Remains on spironolactone . Chronic foley changed q90d or if clogged. Foley secondary to neurogenic bladder. Requires total care secondary to tetraparesis. Hx of DVT and has an IVC filter. Continue GOC and supportive care.   Family/ staff Communication:  nursing  Labs/tests ordered:  none

## 2023-07-18 ENCOUNTER — Ambulatory Visit: Payer: Medicare HMO | Admitting: Occupational Therapy

## 2023-07-18 ENCOUNTER — Ambulatory Visit: Payer: Medicare HMO

## 2023-07-20 ENCOUNTER — Ambulatory Visit: Payer: Medicare HMO | Admitting: Occupational Therapy

## 2023-07-20 ENCOUNTER — Ambulatory Visit: Payer: Medicare HMO

## 2023-07-20 DIAGNOSIS — R269 Unspecified abnormalities of gait and mobility: Secondary | ICD-10-CM

## 2023-07-20 DIAGNOSIS — R262 Difficulty in walking, not elsewhere classified: Secondary | ICD-10-CM

## 2023-07-20 DIAGNOSIS — R2689 Other abnormalities of gait and mobility: Secondary | ICD-10-CM

## 2023-07-20 DIAGNOSIS — M6281 Muscle weakness (generalized): Secondary | ICD-10-CM

## 2023-07-20 DIAGNOSIS — R278 Other lack of coordination: Secondary | ICD-10-CM

## 2023-07-20 DIAGNOSIS — S72001A Fracture of unspecified part of neck of right femur, initial encounter for closed fracture: Secondary | ICD-10-CM

## 2023-07-20 DIAGNOSIS — S14129S Central cord syndrome at unspecified level of cervical spinal cord, sequela: Secondary | ICD-10-CM

## 2023-07-20 DIAGNOSIS — R2681 Unsteadiness on feet: Secondary | ICD-10-CM

## 2023-07-20 NOTE — Therapy (Incomplete Revision)
 OUTPATIENT PHYSICAL THERAPY NEURO TREATMENT  Patient Name: Sherry [name] MRN: [id number] DOB:23-Sep-1934, 89 y.o., female Today's Date: 07/20/2023   PCP: Valrie Gehrig, MD REFERRING PROVIDER: Marlynn Singer   PT End of Session - 07/20/23 1311     Visit Number 121    Number of Visits 139    Date for PT Re-Evaluation 09/12/23    Progress Note Due on Visit 120    PT Start Time 1148    PT Stop Time 1230    PT Time Calculation (min) 42 min    Equipment Utilized During Treatment Gait belt    Activity Tolerance Patient tolerated treatment well;No increased pain    Behavior During Therapy WFL for tasks assessed/performed                        Past Medical History:  Diagnosis Date   Acute blood loss anemia    Arthritis    Cancer (HCC)    skin   Central cord syndrome at C6 level of cervical spinal cord (HCC) 11/29/2017   Hypertension    Protein-calorie malnutrition, severe (HCC) 01/24/2018   S/P insertion of IVC (inferior vena caval) filter 01/24/2018   Tetraparesis (HCC)    Past Surgical History:  Procedure Laterality Date   ANTERIOR CERVICAL DECOMP/DISCECTOMY FUSION N/A 11/29/2017   Procedure: Cervical five-six, six-seven Anterior Cervical Decompression Fusion;  Surgeon: Cannon Champion, MD;  Location: [facility] OR;  Service: Neurosurgery;  Laterality: N/A;  Cervical five-six, six-seven Anterior Cervical Decompression Fusion   CATARACT EXTRACTION     FEMUR IM NAIL Left 08/16/2021   Procedure: INTRAMEDULLARY (IM) NAIL FEMORAL;  Surgeon: Marlynn Singer, MD;  Location: [facility] ORS;  Service: Orthopedics;  Laterality: Left;   IR IVC FILTER PLMT / S&I /IMG GUID/MOD SED  12/08/2017   Patient Active Problem List   Diagnosis Date Noted   Age-related osteoporosis without current pathological fracture 07/27/2022   Mild recurrent major depression (HCC) 07/27/2022   Hypertensive heart disease with other congestive heart failure (HCC) 02/14/2022   Chronic indwelling Foley  catheter 02/14/2022   Closed hip fracture (HCC) 08/15/2021   DVT (deep venous thrombosis) (HCC) 08/15/2021   Quadriplegia (HCC) 08/15/2021   Fall 08/15/2021   Constipation due to slow transit 08/31/2018   Trauma 06/05/2018   Neuropathic pain 06/05/2018   Neurogenic bowel 06/05/2018   Vaginal yeast infection 01/30/2018   Healthcare-associated pneumonia 01/25/2018   Chronic allergic rhinitis 01/24/2018   Depression with anxiety 01/24/2018   UTI due to Klebsiella species 01/24/2018   Protein-calorie malnutrition, severe (HCC) 01/24/2018   S/P insertion of IVC (inferior vena caval) filter 01/24/2018   Tetraparesis (HCC) 01/20/2018   Neurogenic bladder 01/20/2018   Reactive depression    Benign essential HTN    Acute postoperative anemia due to expected blood loss    Central cord syndrome at C6 level of cervical spinal cord (HCC) 11/29/2017   Allergy to alpha-gal 11/25/2016   SCC (squamous cell carcinoma) 04/08/2014    ONSET DATE: 08/15/2021 (fall with B hip fx); Initial injury was in 2019- quadraplegia due to central cord syndrome of C6 secondary to neck fracture.   REFERRING DIAG: Bilateral Hip fx; Left Open- s/p Left IM nail femoral on 08/16/2021; Right closed  THERAPY DIAG:  Muscle weakness (generalized)  Central cord syndrome, sequela (HCC)  Other lack of coordination  Other abnormalities of gait and mobility  Difficulty in walking, not elsewhere classified  Abnormality of gait and mobility  Unsteadiness on  feet  Closed fracture of both hips, initial encounter [facility])  Rationale for Evaluation and Treatment Rehabilitation  SUBJECTIVE:   SUBJECTIVE STATEMENT:   Patient reports doing okay without complaints of pain. States she feels her knee is bowing in more these days.         Pt accompanied by: Paid caregiver-   PERTINENT HISTORY: Sherry [name] is an 87yoF who experienced a fall at home on 08/15/2021 with hip fracture, s/p Left hip ORIF. PMH: quadriplegia due  to central cord syndrome of C6 secondary to neck fracture, HTN, depression with anxiety, neurogenic bladder, bilateral DVT on Xarelto  (s/p of IVC filter placement). Prior history of significant PT/OT with improvement of function. Pt has full use of shoulder/elbows, limited use of hands, adaptive self feeding sucessful.     PAIN:  None current but does endorse some left thigh pain intermittent  OBJECTIVE:   TODAY'S TREATMENT:   07/13/23     Therapeutic Activities: dynamic therapeutic activities designed to achieve improved functional performance -all activities performed in chair today.  Sit to stand Max assist in // bars. Mod assist to position feet in place. Patient able to slide forward. Max assist to stand x several sets today- Blocked knees and positioned left knee into more neutral position- Tendency to excessive valgus.  Static standing in // bars -  3 trials - all over 1 min but less than 2 min with initial trial the most difficult. Increased overall left knee hyperextension and difficulty with anterior lean forward.           PATIENT EDUCATION: Education details: Exercise technique Patient verbalized understanding.   HOME EXERCISE PROGRAM: No changes at this time  Access Code: [facility] URL: [email or web address] Date: 11/23/2021 Prepared by: Marina  Moser Exercises - Seated Gluteal Sets - 2 x daily - 7 x weekly - 2 sets - 10 reps - 5 hold - Seated Quad Set - 2 x daily - 7 x weekly - 2 sets - 10 reps - 5 hold - Seated Long Arc Quad - 2 x daily - 7 x weekly - 2 sets - 10 reps - 5 hold - Seated March - 2 x daily - 7 x weekly - 2 sets - 10 reps - 5 hold - Seated Hip Abduction - 2 x daily - 7 x weekly - 2 sets - 10 reps - 5 hold - Seated Shoulder Shrugs - 2 x daily - 7 x weekly - 2 sets - 10 reps - 5 hold - Wheelchair Pressure Relief - 2 x daily - 7 x weekly - 2 sets - 10 reps - 5 hold     GOALS: Goals reviewed with patient? Yes  SHORT TERM GOALS: Target date:  02/22/2022  Pt will be independent with initial UE strengthening HEP in order to improve strength and balance in order to decrease fall risk and improve function at home and work. Baseline: 10/19/2021- patient with no formal UE HEP; 12/02/2021= Patient verbalized knowledge of HEP including use of theraband for UE strengthening.  Goal status: GOAL MET  LONG TERM GOALS: Target date: 09/12/2023   Pt will be independent with final for UE/LE HEP in order to improve strength and balance in order to decrease fall risk and improve function at home and work. Baseline: Patient is currently BLE NWB and unable to participate in HEP. Has order for UE strengthening. 01/11/2022- Patient now able to participate in LE strengthening although NWB still- good understanding for some basic exercises. Will keep goal active to  incorporate progressive LE strengthening exercises. 04/07/2022= Patient still participating in progressive LE seated HEP and has no questions at this time.  Goal status: MET  2.  Pt will improve FOTO to target score of 35  to display perceived improvements in ability to complete ADL's.  Baseline: 10/19/2021= 12; 12/02/2021= 17; 01/11/2022=17; 04/07/2022= 17; 07/05/2022=17; 10/06/2022- outcome measure not appropriate as patient in non-ambulatory and not consistently standing. Goal status: Goal not appropriate  3.  Pt will increase strength of B UE  by at least 1/2 MMT grade in order to demonstrate improvement in strength and function  Baseline: patient has range of 2-/5 to 4/5 BUE Strength; 12/02/2021= 4/5 except with wrist ext. 01/11/2022= 4/5 B UE strength expept for wrist Goal status: Goal revised-no longer appropriate- working with OT on all UE strengthening.   4. Pt. Will increase strength of RLE by at least 1/2 MMT grade in order to demonstrate improvement in Standing/transfers.  Baseline: 2-/5 Right hip flex/knee ext/flex; 04/07/2022= 2- with right hip flex/knee ext (lacking 28 deg from zero) 4/8= Patient  able to ext right knee lacking 18 deg from zero. 07/05/2022=Left knee approx 8 deg from zero and right knee = 23 deg from zero; 12/29/2022 - Left LE = full ROM 4-/5 and right knee ext= 23 deg from zero= 2+/5 (gravity minimized); 03/28/2023= Left knee ext=  4/5 and right knee ext= 3-/5 (able to move lower leg through incomplete ROM -lacking 20 deg from full ROM against gravity); 05/23/2023= right knee ext= 3-/5 (able to move lower leg through incomplete ROM -lacking 17 deg from full ROM against gravity); 06/20/2023= Right knee ext= lacking 20 deg from zero  Goal status: Progressing  5. Pt. Will demo ability to stand pivot transfer with max assist for improved functional mobility and less dependent need on mechanical device.   Baseline: Dependent on hoyer lift for all transfers; 04/07/2022- Unable to test secondary to patient with recent UTI and right hand procedure - will attempt to reassess next visit. ; 04/12/22: Unable to successfully stand due to feet plates on loaned power w/c; 05/10/2022- Patient still having to use rental chair and has not received her original power w/c back to practice SPT. 07/04/2021= Patient just received new power w/c and attempted standing from 1st time today- Patient able to stand with max assist today from w/c and did not attempt to perform pivot transfer- difficulty with static standing and placing weight on right LE. 10/06/2022- Not assessed today- patient needs new AFO prior to attempting transfers; 12/29/2022= Patient has not attempted SPT yet - still trying to improve LE strength enough to stand well without knee buckling with weight shifting. 03/28/2023- not attempted today due to ill fitting brace but patient last performed SPT on 01/10/23 and was able to perform with Max A with feet pre-positioned to angle toward direction of transfer; 05/23/2023- Not tested today but on last visit- patient able to perform SPT with max assist yet concern for twisting knee as she was unable to turn/pivot  foot .  06/20/2023- deferred to next session due to patient fatigued with static standing today.  Goal status: ONGOING  6. Pt. Will demonstrate improved functional LE strength as seen by ability to stand > 2 min for improved transfer ability and pregait abilities.   Baseline: not assessed today due to recent dx: UTI; 04/07/2022- Unable to test secondary to patient with recent UTI and right hand procedure - will attempt to reassess next visit; 04/12/22: Unable to successfully stand due to  feet plates on loaned power w/c 05/10/2022- Patient still having to use rental chair and has not received her original power w/c back to practice Standing. 07/05/2022- Patient able to stand with max assist at trunk today for 2 min 3 sec. Will keep goal active to ensure consistency. 10/06/2022= Unable to assess today- patient with some recent blood pressure issues and needs new AFO. 11/24/2022= Patient demonstrated multiple rounds of standing in // bars- holding up to 1:45 min today but has been able to exhibit 2 min in recent past- working on consistency. 12/29/2022= 2 min 40 sec with patient demonstrating improved overall posture and ability to contract glutes and quads with just min assist at trunk today.   Goal status: MET  7. Pt. Will demonstrate improved functional LE strength as seen by ability to stand > 5 min for improved transfer ability and pregait abilities. Baseline: 12/29/2022= 2 min 40 sec with patient demonstrating improved overall posture and ability to contract glutes and quads with just min assist at trunk today. 03/28/2023= Attempted 2 trials today - at best 2 min 48 sec today but during cert- did stand for 3 min 44 sec. 06/20/2023= Patient able to stand 2x (45 sec and 1 min 12 sec)- max assist yet no slippage of LLE.  07/13/2023= Patient able to stand x 3 trials today- each 1-2 min focusing on posture- patient with improving gluteal activation and less physical assist once in standing position. Goal Status:  Ongoing  8. Patient will perform sit to stand transfer with moderate assist consistently > 75% of time for improved transfer ability and less dependence on caregiver.   Baseline: 12/29/2022- Patient currently max assist level assistance with all sit to stand activities. 03/28/2023= Patient is still majority of max assist to stand yet able to pull up from wheelchair smoother in one motion. 06/20/2023- Max Assist but has only most recently resumed standing since last week so goal still appropriate for new cert; 2/95/6213=YQMVH max assist but less overall assist on last trial of standing with improved forward lean and gluteal activation.   Goal status: ONGOING   ASSESSMENT:  CLINICAL IMPRESSION:   Patient reported feeling better and no pain with standing today. She was able to improve her standing posture- improved gluteal activation on 2nd and 3rd trial of standing and no slippage of left brace sliding forward today. Slow progress with recent return to standing but Patient's condition has the potential to improve in response to therapy. Maximum improvement is yet to be obtained. The anticipated improvement is attainable and reasonable in a generally predictable time.   Pt will continue to benefit from skilled PT services to optimize independence and reduced caregiver support.   OBJECTIVE IMPAIRMENTS Abnormal gait, decreased activity tolerance, decreased balance, decreased coordination, decreased endurance, decreased mobility, difficulty walking, decreased ROM, decreased strength, hypomobility, impaired flexibility, impaired UE functional use, postural dysfunction, and pain.   ACTIVITY LIMITATIONS carrying, lifting, bending, sitting, standing, squatting, sleeping, stairs, transfers, bed mobility, continence, bathing, toileting, dressing, self feeding, reach over head, hygiene/grooming, and caring for others  PARTICIPATION LIMITATIONS: meal prep, cleaning, laundry, medication management, personal finances,  interpersonal relationship, driving, shopping, community activity, and yard work  PERSONAL FACTORS Age, Time since onset of injury/illness/exacerbation, and 1-2 comorbidities: HTN, cervical Sx are also affecting patient's functional outcome.   REHAB POTENTIAL: Good  CLINICAL DECISION MAKING: Evolving/moderate complexity  EVALUATION COMPLEXITY: Moderate  PLAN: PT FREQUENCY: 1-2x/week  PT DURATION: 12 weeks  PLANNED INTERVENTIONS: Therapeutic exercises, Therapeutic activity, Neuromuscular re-education, Balance  training, Gait training, Patient/Family education, Self Care, Joint mobilization, DME instructions, Dry Needling, Electrical stimulation, Wheelchair mobility training, Spinal mobilization, Cryotherapy, Moist heat, and Manual therapy  PLAN FOR NEXT SESSION:   Continue with LE and core strength training  Sit to stand and static standing SPT as able.     1:11 PM, 07/20/23  1:11 PM, 07/20/23 Ossie Blend, PT Physical Therapist - [facility]  Outpatient Physical Therapy- Main Campus [phone]

## 2023-07-20 NOTE — Therapy (Addendum)
 Occupational Therapy Treatment Note  Patient Name: Sherry [name] MRN: [id number] DOB:[birth year], 89 y.o., female Today's Date: 11/29/2022  PCP:  Waunita Haff PROVIDER: Valrie Gehrig, MD  END OF SESSION:   OT End of Session - 07/20/23 1410     Visit Number 131    Number of Visits 192    Date for OT Re-Evaluation 10/05/23    Authorization Time Period Progress report period starting 04/04/23    OT Start Time 1100    OT Stop Time 1145    OT Time Calculation (min) 45 min    Activity Tolerance Patient tolerated treatment well    Behavior During Therapy [facility] for tasks assessed/performed                       Past Medical History:  Diagnosis Date   Acute blood loss anemia    Arthritis    Cancer (HCC)    skin   Central cord syndrome at C6 level of cervical spinal cord (HCC) 11/29/2017   Hypertension    Protein-calorie malnutrition, severe (HCC) 01/24/2018   S/P insertion of IVC (inferior vena caval) filter 01/24/2018   Tetraparesis (HCC)    Past Surgical History:  Procedure Laterality Date   ANTERIOR CERVICAL DECOMP/DISCECTOMY FUSION N/A 11/29/2017   Procedure: Cervical five-six, six-seven Anterior Cervical Decompression Fusion;  Surgeon: Cannon Champion, MD;  Location: [facility] OR;  Service: Neurosurgery;  Laterality: N/A;  Cervical five-six, six-seven Anterior Cervical Decompression Fusion   CATARACT EXTRACTION     FEMUR IM NAIL Left 08/16/2021   Procedure: INTRAMEDULLARY (IM) NAIL FEMORAL;  Surgeon: Marlynn Singer, MD;  Location: [facility] ORS;  Service: Orthopedics;  Laterality: Left;   IR IVC FILTER PLMT / S&I /IMG GUID/MOD SED  12/08/2017   Patient Active Problem List   Diagnosis Date Noted   Age-related osteoporosis without current pathological fracture 07/27/2022   Mild recurrent major depression (HCC) 07/27/2022   Hypertensive heart disease with other congestive heart failure (HCC) 02/14/2022   Chronic indwelling Foley catheter 02/14/2022   Closed hip fracture  (HCC) 08/15/2021   DVT (deep venous thrombosis) (HCC) 08/15/2021   Quadriplegia (HCC) 08/15/2021   Fall 08/15/2021   Constipation due to slow transit 08/31/2018   Trauma 06/05/2018   Neuropathic pain 06/05/2018   Neurogenic bowel 06/05/2018   Vaginal yeast infection 01/30/2018   Healthcare-associated pneumonia 01/25/2018   Chronic allergic rhinitis 01/24/2018   Depression with anxiety 01/24/2018   UTI due to Klebsiella species 01/24/2018   Protein-calorie malnutrition, severe (HCC) 01/24/2018   S/P insertion of IVC (inferior vena caval) filter 01/24/2018   Tetraparesis (HCC) 01/20/2018   Neurogenic bladder 01/20/2018   Reactive depression    Benign essential HTN    Acute postoperative anemia due to expected blood loss    Central cord syndrome at C6 level of cervical spinal cord (HCC) 11/29/2017   Allergy to alpha-gal 11/25/2016   SCC (squamous cell carcinoma) 04/08/2014   ONSET DATE: 11/29/2017  REFERRING DIAG: Central Cord Syndrome at C6 of the Cervical Spinal Cord, Fall with Bilateral Closed Hip Fractures, with ORIF repair with Intramedullary Nailing of the Left Hip Fracture.   THERAPY DIAG:  Muscle weakness (generalized)  Other lack of coordination  Rationale for Evaluation and Treatment: Rehabilitation  SUBJECTIVE:  SUBJECTIVE STATEMENT:  Pt. reports that her husband is in the hospital.   Pt accompanied by: self, Personal Care aide  PERTINENT HISTORY: Central Cord Syndrome at C6 of the Cervical Spinal Cord, Fall with Bilateral Closed  Hip Fractures, with ORIF repair with Intramedullary Nailing of the Left Hip Fracture.   PRECAUTIONS: None  WEIGHT BEARING RESTRICTIONS: No  PAIN:  Are you having pain? No pain   LIVING ENVIRONMENT: Lives with: [facility] Stairs: No Has following equipment at home: Wheelchair (power)  PLOF: Independent   PATIENT GOALS:  See below for established goals  OBJECTIVE:  Note: Objective measures were completed  at Evaluation unless otherwise noted.  HAND DOMINANCE: Right  ADLs: Caregiver assist with ADLs, and [facility]  MOBILITY STATUS: Uses a power w/c  ACTIVITY TOLERANCE: Activity tolerance: Fair  FUNCTIONAL OUTCOME MEASURES:  Measurements:   12/09/2021:   Shoulder flexion: R: 120(136), L: 138(150) Shoulder abduction: R: 108(120), L: 110(120) Elbow: R: 0-148, L: 0-146 Wrist flexion: R: 55(62), L: 78 Writ extension: R: 34(55), L: 3(4) RD: R: 12(20), L: 16(26) UD: R: 8(24), L: 6(18) Thumb radial abduction: R: 11(20), L: 8(20) Digit flexion to the [facility]: R:  2nd: 5cm(3.5cm), 3rd: 7.5 cm(5cm), 4th: 7cm(3cm), 5th: 6cm(5.5cm) L: 2nd: 8cm(8cm), 3rd: 8cm(8cm), 4th: 7.5cm(7cm), 5th: 7cm(7cm)   02/03/2022:   Shoulder flexion: R: 122(138), L: 148(154) Shoulder abduction: R: 109(125), L: 125(125 Elbow: R: 0-148, L: 0-146 Wrist flexion: R: 60(68), L: 79 Writ extension: R: 40(55), L: 3(10) RD: R: 14(24), L: 20(28) UD: R: 8(24), L: 6(18) Thumb radial abduction: R: 20(25), L: 8(20) Digit flexion to the [facility]: R:  2nd: 4.5cm(3cm), 3rd: 6.5cm(4.5cm), 4th: 6.5 cm(3cm), 5th: 4.5cm(5cm) L: 2nd: 8cm(8cm), 3rd: 8cm(7cm), 4th: 7.5cm(7cm), 5th: 6.5cm(6.5cm)   04/07/2022:   Shoulder flexion: R: 125(150), L: 160(160) Shoulder abduction: R: 109(125), L: 125(125 Elbow: R: 0-148, L: 0-146 Wrist flexion: R: 60(68), L: 79 Writ extension: R: 40(55), L: 5(18) RD: R: 14(24), L: 20(28) UD: R: 18(24), L: 8(18) Thumb radial abduction: R: 20(25), L: 8(20) Digit flexion to the [facility]: R:  2nd: 4.5 cm(4 cm), 3rd: 6.5cm(3cm), 4th: 7 cm (5cm), 5th: 5cm(5cm) L: 2nd: 8cm(8cm), 3rd: 8cm(7cm), 4th: 7.5cm(7cm), 5th: 7cm(7cm)   07/05/2022:   Shoulder flexion: R: 125(154), L: 160(160) Shoulder abduction: R: 95(130), L: 143(143) Elbow: R: 0-148, L: 0-146 Wrist flexion: R: 60(68), L: 79 Writ extension: R: 40(60), L: 0(12) RD: R: 10(24), L: 12(20) UD: R: 16(24), L: 8(16) Thumb radial abduction: R: 20(25), L:  8(20) Digit flexion to the [facility]: R:  2nd: 4 cm(4cm), 3rd: 6.5cm( 3.5cm), 4th: 7 cm (5cm), 5th: 6cm(5cm) L: 2nd: 8cm(8cm), 3rd: 8cm(7cm), 4th: 8cm(7cm), 5th: 7cm(7cm)   09/13/2022:   Shoulder flexion: R: 125(154), L: 160(160) Shoulder abduction: R: 100(130), L: 143(143) Elbow: R: 0-150, L: 0-150 Wrist flexion: R: 55(68), L: 79 Writ extension: R: 45(60), L: 0(12) RD: R: 10(24), L: 12(20) UD: R: 16(24), L: 8(16) Thumb radial abduction: R: 20(25), L: 8(20) Digit flexion to the [facility]: R:  2nd: 4 cm(4cm), 3rd: 6cm( 5cm), 4th: 6 cm (4cm), 5th: 5cm(5cm) L: 2nd: 7cm(cm), 3rd: 7cm(6cm), 4th: 7cm(6cm), 5th: 6cm(6cm)   10/27/2022   Shoulder flexion: R: 128(154), L: 160(160) Shoulder abduction: R: 105(130), L: 143(143) Elbow: R: 0-150, L: 0-150 Wrist flexion: R: 55(68), L: 79 Writ extension: R: 46(64), L: 0(20) RD: R: 10(20), L: 18(24) UD: R: 18(28), L: 8(20) Thumb radial abduction: R: 30(42), L:20(24) Digit flexion to the [facility]: R:  2nd: 4 cm(3cm), 3rd: 6cm( 6cm), 4th: 6 cm (5cm), 5th: 5cm(5cm) L: 2nd: 8cm(8cm), 3rd: 8cm(6cm), 4th: 8cm(7cm), 5th: 6cm(6cm)  01/19/2023  Shoulder flexion: R: 131(154), L: 160(160) Shoulder abduction: R: 110(130), L: 143(143) Elbow: R: 0-150, L: 0-150 Wrist  flexion: R: 55(68), L: 79 Writ extension: R: 46(64), L: 0(20) RD: R: 10(20), L: 18(24) UD: R: 18(28), L: 14(20) Thumb radial abduction: R: 38(48), L:24(26) Thumb IP flexion: R: 35(50) L: 20(45) Digit flexion to the [facility]: R:  2nd: 3.5 cm(3cm), 3rd: 6cm( 6cm), 4th: 6 cm (5cm), 5th: 5cm(4.5cm) L: 2nd: 8cm(7.5cm), 3rd: 8cm(6cm), 4th: 7.5cm(6cm), 5th: 6cm(4.5cm)  04/04/2023  Shoulder flexion: R: 134(154), L: 160(160) Shoulder abduction: R: 114(130), L: 143(143) Elbow: R: WNL, L: WNL Wrist flexion: R: 55(68), L: 80 Writ extension: R: 46(64), L: -10(10) RD: R: 14(24), L: 20(24) UD: R: 20(30), L: 14(20) Thumb radial abduction: R: 38(48), L:28(30) Thumb IP flexion: R: 40(50) L: 25(45) Digit flexion to the  [facility]: R:  2nd: 3.5 cm(3cm), 3rd: 6cm( 5cm), 4th: 6 cm (4cm), 5th: 5cm(5cm) L: 2nd: 8cm(7.5cm), 3rd: 8cm(7cm), 4th: 7.5cm(7cm), 5th: 7cm(6cm)  05/25/2023  Shoulder flexion: R: 134(154), L: 160(160) Shoulder abduction: R: 117(134), L: 143(143) Elbow: R: WNL, L: WNL Wrist flexion: R: 55(68), L: 80 Wrist extension: R: 46(64), L: -8(14) RD: R: 14(24), L: 20(24) UD: R: 20(32), L: 14(22) Thumb radial abduction: R: 38(50), L:28(30) Thumb IP flexion: R: 40(50) L: 30(45) Digit flexion to the [facility]: R:  2nd: 3.5 cm(3cm), 3rd: 6cm( 5cm), 4th: 7 cm (4cm), 5th: 5cm(4cm) L: 2nd: 8cm(7.5cm), 3rd: 8cm(7cm), 4th: 7.5cm(7cm), 5th: 7cm(6cm)  07/13/23  Shoulder flexion: R: 134(154), L: 160(160) Shoulder abduction: R: 122(134), L: 143(143) Elbow: R: WNL, L: WNL Wrist flexion: R: 55(68), L: 80 Wrist extension: R: 50(64), L: -4(18) RD: R: 18(24), L: 24(24) UD: R: 20(32), L: 14(22) Thumb radial abduction: R: 38(50), L:3230) Thumb IP flexion: R: 42(50) L: 30(45) Digit flexion to the [facility]: R:  2nd: 3.5 cm(3cm), 3rd: 6cm( 5cm), 4th: 7 cm (4cm), 5th: 5cm(4cm) L: 2nd: 8cm(7.5cm), 3rd: 8cm(7cm), 4th: 7.5cm(7cm), 5th: 7cm(6cm)   COORDINATION:   01/19/2023:  9 hole peg test:  Right: 2 pegs placed in 1 min. & 5 sec.  Pt. was able to remove 9 pegs in 17 sec.  Left: 2 pegs placed in 1 min. & 10 sec. Pt. was able to remove 9 pegs in 20 sec.  04/04/23:  Pt. was able to consistently grasp the pegs however each one slipped out of her fingers when attempting to place them into the Pegboard.  05/25/2023:   Right: 9 pegs placed and removed in 2 min. & 41 sec. Left: Unable to grasp the pegs for a horizontal position.  07/13/23    Right: 9 pegs placed and removed in 2 min. & 11 sec.   SENSATION: Intact  EDEMA: N/A   COGNITION: Overall cognitive status: WNL   VISION: Subjective report: Wears glasses, No changes in vision.  PERCEPTION: Intact  TODAY'S TREATMENT:                                                                                                                               DATE:  07/20/23  Therapeutic Ex.  Pt. tolerated PROM followed by AROM bilateral wrist extension, PROM for bilateral digit MP, PIP, and DIP flexion, and extension, thumb abduction. Pt. performed 2# dowel ex. For 1 set, followed by 3# for the 2nd set. BUE strengthening was performed secondary to weakness.  Pt. Worked on BUE strengthening, and reciprocal motion using the UBE while seated for 8 min. with no resistance. Constant monitoring was provided. Attempted tendon glide exercises in preparation for fisting. Bilateral shoulder flexion, chest press, circular patterns, and elbow flexion/extension were performed       PATIENT EDUCATION: Education details: Bilateral hand and shoulder ROM, and reacher use Person educated: Patient Education method: Explanation, Demonstration, and Verbal cues Education comprehension: verbalized understanding and returned demonstration  HOME EXERCISE PROGRAM: Continue to assess HEP needs, and provide as needed.   GOALS: Goals reviewed with patient? Yes  LONG TERM GOALS: Target date: 10/05/2023  3.  Patient will independently use a reacher to pick up items from various surfaces.  Baseline: 07/13/2023: Pt. Is starting to use a reacher to retrieve items with an adaptation/modification to the trigger.05/25/2023:  Pt. is now able to initiate grasping a reacher to open and close the reacher with the right hand, however requires the grasp handle to be repositioned  within the hand. Pt. requires work on using/moving the reacher with the right hand through various planes. 04/04/2023: Pt. continues to work on improving Digit flexion to the [facility] to be able to efficiently handle Adaptive equipment. 01/19/2023: Pt. Is improving with Bilateral digit flexion to the [facility] Pt. has difficulty firmly holding adaptive devices. 12/06/2022: Pt. continues to present with increased MP, PIP, and DIP digit  tightness/stiffness limiting the formulation of bilateral composites fists. 10/27/2022: Pt. presents with increased MP, PIP, and DIP digit tightness/stiffness limiting the formulation of bilateral composites fists during this progress reporting period limiting. Digit flexion to the [facility]: R:  2nd: 4 cm(3cm), 3rd: 6cm( 6cm), 4th: 6 cm (5cm), 5th: 5cm(5cm), L: 2nd: 8cm(8cm), 3rd: 8cm(6cm), 4th: 8cm(7cm), 5th: 6cm(6cm) 09/20/2022: Improving digit flexion to the [facility]. 09/13/22: improved digit flexion to the [facility]. 07/05/2022: Pt. presents with digit MP, PIP, and DIP extensor tightness limiting her ability to securely grip objects in her bilateral hands.  04/07/2022: Pt. Has improved with right 2nd, and 3rd digit flexion towards the [facility]. Pt. Continues to have difficulty securely holding and applying deodorant.02/03/2022: Pt. Has improved with digit flexion, however, continues to have difficulty securely holding and using deodorant. 12/09/2021: Bilateral hand/digit MP, PIP, and DIP extension tightness limits her ability achieve digit flexion to hold, and apply deodorant. 10/28/2021:  Pt. continues to have difficulty holding the deodorant. 01/12/2021: Pt. Presents with limited digit extension. Pt. Is able to initiate holding deodorant, however is unable to hold it while using it  Goal status: Ongoing   4.  Pt. Will improve bilateral wrist extension in preparation for anticipating, and initiating reaching for objects at the table.  Baseline: 07/13/23:  Wrist extension: R: 50(64), L: -4(18) 05/25/23: Wrist extension: R: 46(64), L: -8(14) 04/04/23: R: 46(64) L: -10(10)01/19/2023:  R: 46(64) L: 0(20) 12/06/2022: Pt continues to to progress with bilateral wrist extension in preparation fro functional reaching. 10/27/2022: Pt. Is improving with bilateral wrist extension. R: 46(64) L: 0(20) 09/20/2022: limited PROM in the Left wrist extension 2/2 tightness/stiffness. 09/13/22: R: 55(68), L: 0(12) 07/19/2022: Bilateral wrist extension in  limited. 07/05/2022: Right: 40(60) Left: 0(12) 04/07/2022: Right: 40(55) Left: 5(18) 02/03/2022: Right: 34(55) Left: 3(4) 12/09/2021: Right  34(55)  Left  3(4) 10/28/2021:  Right 22(38), Left 0(15) 09/14/2021:  Right: 17(35), left 2(15)  Goal status: Progressing/Ongoing  7.  Pt. Will increase bilateral lateral pinch strength by 2 lbs to be able to securely grasp items during ADLs, and IADL tasks.  Baseline: 01/19/2023: Deferred as the Pinch meter is out for calibration.12/06/2022: Pt. Is able to more securely hold objects during ADLs/IADLs 10/27/2022: Pt. Is improving with holding items with her bilateral thumbs. (Pinch meter out for calibration) 09/20/2022: lateral pinch continues to be limited 09/13/22: R 6.5# L 4# 07/19/2022: TBD 07/05/2022: TBD 07/2022: NT-Pinch meter out for calibration. 02/03/2022: Right: 6#, Left: 4# 12/09/2021: Right: 6#, Left: 4#  Goal status: Deferred   9.  Pt. will complete plant care with modified independence.  Baseline:12/06/2022: Pt. Is independent with plant care in her husband's room, however is not able to access her plants due to furniture blocking access to them. 10/27/2022: Pt. Is able to water plants that are closer, and within reach. Pt. continues to have difficulty reaching for thorough plant care. 09/20/2022: Pt. Continues to have difficulty reaching for thorough plant care. 09/13/22: continues to report intermittent difficulty 07/19/2022: Pt. Continues to be able to water, and care for some of her plants. Pt. Has more difficulty with plants that are harder to reach. 07/05/2022: pt. Is able to water, and care for some of her plants. Pt. Has more difficulty with plants that are harder to reach. 04/07/2022: Pt. is now able to hold a cup and water her plants.02/03/2022: Pt. has difficulty caring for her plants.  Goal status:Achieved   10.  Pt. will demonstrate adaptive techniques to assist with the efficiency of self-dressing, or morning care tasks.  Baseline: 07/13/23:Pt. continues  to work on improving UE functioning to be able to efficiently use Adaptive equipment 05/25/2023: Pt. continues to work on improving UE functioning to be able to efficiently use Adaptive equipment.  04/04/2023:  Pt. continues to work on improving UE functioning to be able to efficiently use Adaptive equipment. 01/19/2203: Pt. Is able to donn her jacket in reverse independently. Pt. requires assist from staff, as Pt. Reports staff have decreased time. 12/06/2022: Pt. requires assist from staff, as Pt. Reports staff have decreased time. 10/27/2022: Pt. is able to assist with initiating UE dressing. Pt. requires assist from personal, and staff care aides. 09/20/2022: Pt. continues to require assist form personal, and staff care aides. 09/13/22: MODA dressing, reports she is often rushed 07/19/2022: Pt. continues to require assist with self-dressing/morning care tasks. 07/05/2022: Continue with goal. 04/07/2022: Pt. Continues to require assist with the efficiency of self-dressing, and morning care tasks.02/03/2022: Pt. requires assist from caregivers 2/2 time limitations during morning care.  Goal status: Ongoing  11.  Pt. will improve BUE strength to improve ADL, and IADL functioning.  Baseline: 07/13/23: shoulder flexion L 4/5, R 4-/5; shoulder abduction L 4+/5, R 4-/5. elbow flexion B 5/5, elbow extension 4+/5 01/19/2023: L 4/5, R 3+/5  05/25/2023: shoulder flexion L 4/5, R 4-/5; shoulder abduction L 4+/5, R 4-/5. elbow flexion B 5/5, elbow extension 4+/5 01/19/2023: L 4/5, R 3+/5  04/04/2023: shoulder flexion L 4/5, R 3+/5; shoulder abduction L 4+/5, R 3+/5. elbow flexion B 5/5, elbow extension 4/5 01/19/2023: L 4/5, R 3+/5 12/06/2022: Continue 10/27/2022: shoulder flexion L 4/5, R 3+/5; shoulder abduction L 4+/5, R 3+/5. elbow flexion B 5/5, elbow extension 4/5 09/20/2022: Pt. Presents with limited BUE strength 09/13/22: shoulder flexion L 4/5, R 3/5; elbow flexion B 5/5, elbow extension 4-/5; shoulder abduction L 4+/5, R  3+/5. 07/19/2022: BUE strength continues to be limited. 07/05/2022: shoulder flexion: right 4-/5, abduction: 3+/5, elbow flexion: right: 5/5, left 5/5, extension: right: 4-/5, left 4-/5, wrist extension: right: 3-/5, left: 2-/5  Goal status: Ongoing  12.  Pt. will improve bilateral thumb radial abduction in order to be able to hold the grab bars while standing with PT  Baseline: 07/13/23: Pt. Is improving with holding the grab bars with the right hand .05/25/23: Thumb radial abduction: R: 38(50), L:28(30) 04/04/23: R: 38(48), L:28(30) 01/19/2023: R: 38(48) L: 24(26)12/06/2022: Pt. Continues to present  with limited thumb abduction, however is improving holding onto the parallel bars.10/27/2022: thumb radial abduction: R: 30(42), L: 20(24  Goal status: Ongoing  13.  Pt. Will be able to securely hold, and stabilize medication bottles at a tabletop surface when opening, and closing them.  Baseline: 01/19/2023: Pt. Is now able to securely hold medication bottles while opening them, and stabilizes them on surfaces while opening them. 12/06/2022: Pt is improving with securely stabilizing medication bottles. 10/27/2022: Pt. Stabilizes bottles against her torso when attempting to open, and close them   Goal status: Achieved  14: Pt. Will improve bilateral thumb IP flexion to improve active grasping patterns.    Baseline: 07/13/23: Thumb IP flexion: R: 42(50) L: 30(45) 05/25/2023: Thumb IP flexion: R: 40(50) L: 30(45) 04/04/2023:  R: 40(50) L: 25(45) 01/19/2023: Right 35(50), Left: 20/45)    Goal status: Ongoing    15. Pt. Will improve bilateral FMC/speed, and dexterity. As evidence by improved scores on the 9 hole peg test.  Baseline: 07/13/2023: Right: 9 pegs placed and removed in 2 min. & 11 sec. 05/25/2023: 9 Hole pegs test speed/dexterity: Right: 9 pegs placed and removed in 2 min. & 41 sec. Left: Unable to grasp the pegs for a horizontal position. 04/04/23: Pt. was able to consistently grasp the pegs however each  one slipped out of her fingers when attempting to place them into the Pegboard. 01/19/2023: 2 pegs placed in 1 min. & 5 sec.  Pt. was able to remove 9 pegs in 17 sec.; Left: 2 pegs placed in 1 min. & 10 sec. Pt. was able to remove 9 pegs in 20 sec.   Goal status: Ongoing  ASSESSMENT:  CLINICAL IMPRESSION:  Pt. continues to tolerate BUE strengthening with a 2# dowel for the 1st set, and upgraded to 3# weight for the 2nd set today for UE strengthening exercises without difficulty today. Pt. Was able to tolerate BUE strengthening exercises. Pt. continues to benefit form OT services to work on impoving ROM, and UE strength in order to work towards increasing bilateral hand grasp on objects, and increasing engagement of bilateral hands during ADLs, and IADL tasks.      PERFORMANCE DE PERFORMANCE DEFICITS: in functional skills including ADLs, IADLs, coordination, dexterity, ROM, strength, and UE functional use, cognitive skills including , and psychosocial skills including coping strategies and environmental adaptation.   IMPAIRMENTS: are limiting patient from ADLs, IADLs, and leisure.   CO-MORBIDITIES: may have co-morbidities  that affects occupational performance. Patient will benefit from skilled OT to address above impairments and improve overall function.  MODIFICATION OR ASSISTANCE TO COMPLETE EVALUATION: Min-Moderate modification of tasks or assist with assess necessary to complete an evaluation.  OT OCCUPATIONAL PROFILE AND HISTORY: Detailed assessment: Review of records and additional review of physical, cognitive, psychosocial history related to current functional performance.  CLINICAL DECISION MAKING: Moderate - several treatment options, min-mod task modification necessary  REHAB POTENTIAL: Good for stated goals  PLAN:  OT FREQUENCY 2x's a week  OT DURATION: 12 weeks  PLANNED INTERVENTIONS ADL training, A/E training, UE ther. Ex, Manual therapy, neuromuscular re-education, moist  heat modality, Paraffin Bath, Splinting, and  Pt./caregiver education   RECOMMENDED OTHER SERVICES: PT  CONSULTED AND AGREED WITH PLAN OF CARE: Patient  PLAN FOR NEXT SESSION: Continue Treatment as per established POC  Duey Ghent, MS, OTR/L   07/20/23, 2:19 PM

## 2023-07-20 NOTE — Therapy (Signed)
 OUTPATIENT PHYSICAL THERAPY NEURO TREATMENT/Physical Therapy Progress Note   Dates of reporting period  05/23/2023   to   07/13/2023    Patient Name: Sherry [name] MRN: [id number] DOB:23-Mar-1934, 89 y.o., female Today's Date: 07/20/2023   PCP: Valrie Gehrig, MD REFERRING PROVIDER: Marlynn Singer   PT End of Session - 07/20/23 1311     Visit Number 121    Number of Visits 139    Date for PT Re-Evaluation 09/12/23    Progress Note Due on Visit 120    PT Start Time 1148    PT Stop Time 1230    PT Time Calculation (min) 42 min    Equipment Utilized During Treatment Gait belt    Activity Tolerance Patient tolerated treatment well;No increased pain    Behavior During Therapy WFL for tasks assessed/performed                        Past Medical History:  Diagnosis Date   Acute blood loss anemia    Arthritis    Cancer (HCC)    skin   Central cord syndrome at C6 level of cervical spinal cord (HCC) 11/29/2017   Hypertension    Protein-calorie malnutrition, severe (HCC) 01/24/2018   S/P insertion of IVC (inferior vena caval) filter 01/24/2018   Tetraparesis (HCC)    Past Surgical History:  Procedure Laterality Date   ANTERIOR CERVICAL DECOMP/DISCECTOMY FUSION N/A 11/29/2017   Procedure: Cervical five-six, six-seven Anterior Cervical Decompression Fusion;  Surgeon: Cannon Champion, MD;  Location: [facility] OR;  Service: Neurosurgery;  Laterality: N/A;  Cervical five-six, six-seven Anterior Cervical Decompression Fusion   CATARACT EXTRACTION     FEMUR IM NAIL Left 08/16/2021   Procedure: INTRAMEDULLARY (IM) NAIL FEMORAL;  Surgeon: Marlynn Singer, MD;  Location: [facility] ORS;  Service: Orthopedics;  Laterality: Left;   IR IVC FILTER PLMT / S&I /IMG GUID/MOD SED  12/08/2017   Patient Active Problem List   Diagnosis Date Noted   Age-related osteoporosis without current pathological fracture 07/27/2022   Mild recurrent major depression (HCC) 07/27/2022   Hypertensive  heart disease with other congestive heart failure (HCC) 02/14/2022   Chronic indwelling Foley catheter 02/14/2022   Closed hip fracture (HCC) 08/15/2021   DVT (deep venous thrombosis) (HCC) 08/15/2021   Quadriplegia (HCC) 08/15/2021   Fall 08/15/2021   Constipation due to slow transit 08/31/2018   Trauma 06/05/2018   Neuropathic pain 06/05/2018   Neurogenic bowel 06/05/2018   Vaginal yeast infection 01/30/2018   Healthcare-associated pneumonia 01/25/2018   Chronic allergic rhinitis 01/24/2018   Depression with anxiety 01/24/2018   UTI due to Klebsiella species 01/24/2018   Protein-calorie malnutrition, severe (HCC) 01/24/2018   S/P insertion of IVC (inferior vena caval) filter 01/24/2018   Tetraparesis (HCC) 01/20/2018   Neurogenic bladder 01/20/2018   Reactive depression    Benign essential HTN    Acute postoperative anemia due to expected blood loss    Central cord syndrome at C6 level of cervical spinal cord (HCC) 11/29/2017   Allergy to alpha-gal 11/25/2016   SCC (squamous cell carcinoma) 04/08/2014    ONSET DATE: 08/15/2021 (fall with B hip fx); Initial injury was in 2019- quadraplegia due to central cord syndrome of C6 secondary to neck fracture.   REFERRING DIAG: Bilateral Hip fx; Left Open- s/p Left IM nail femoral on 08/16/2021; Right closed  THERAPY DIAG:  Muscle weakness (generalized)  Central cord syndrome, sequela (HCC)  Other lack of coordination  Other abnormalities of  gait and mobility  Difficulty in walking, not elsewhere classified  Abnormality of gait and mobility  Unsteadiness on feet  Closed fracture of both hips, initial encounter [facility])  Rationale for Evaluation and Treatment Rehabilitation  SUBJECTIVE:   SUBJECTIVE STATEMENT:   Patient reports doing okay without complaints of pain. States she feels her knee is bowing in more these days.         Pt accompanied by: Paid caregiver-   PERTINENT HISTORY: Sherry [name] is an 87yoF who  experienced a fall at home on 08/15/2021 with hip fracture, s/p Left hip ORIF. PMH: quadriplegia due to central cord syndrome of C6 secondary to neck fracture, HTN, depression with anxiety, neurogenic bladder, bilateral DVT on Xarelto  (s/p of IVC filter placement). Prior history of significant PT/OT with improvement of function. Pt has full use of shoulder/elbows, limited use of hands, adaptive self feeding sucessful.     PAIN:  None current but does endorse some left thigh pain intermittent  OBJECTIVE:   TODAY'S TREATMENT:   07/13/23     Therapeutic Activities: dynamic therapeutic activities designed to achieve improved functional performance -all activities performed in chair today.  Sit to stand Max assist in // bars. Mod assist to position feet in place. Patient able to slide forward. Max assist to stand x several sets today- Blocked knees and positioned left knee into more neutral position- Tendency to excessive valgus.  Static standing in // bars -  3 trials - all over 1 min but less than 2 min with initial trial the most difficult. Increased overall left knee hyperextension and difficulty with anterior lean forward.           PATIENT EDUCATION: Education details: Exercise technique Patient verbalized understanding.   HOME EXERCISE PROGRAM: No changes at this time  Access Code: [facility] URL: [email or web address] Date: 11/23/2021 Prepared by: Marina  Moser Exercises - Seated Gluteal Sets - 2 x daily - 7 x weekly - 2 sets - 10 reps - 5 hold - Seated Quad Set - 2 x daily - 7 x weekly - 2 sets - 10 reps - 5 hold - Seated Long Arc Quad - 2 x daily - 7 x weekly - 2 sets - 10 reps - 5 hold - Seated March - 2 x daily - 7 x weekly - 2 sets - 10 reps - 5 hold - Seated Hip Abduction - 2 x daily - 7 x weekly - 2 sets - 10 reps - 5 hold - Seated Shoulder Shrugs - 2 x daily - 7 x weekly - 2 sets - 10 reps - 5 hold - Wheelchair Pressure Relief - 2 x daily - 7 x weekly - 2 sets -  10 reps - 5 hold     GOALS: Goals reviewed with patient? Yes  SHORT TERM GOALS: Target date: 02/22/2022  Pt will be independent with initial UE strengthening HEP in order to improve strength and balance in order to decrease fall risk and improve function at home and work. Baseline: 10/19/2021- patient with no formal UE HEP; 12/02/2021= Patient verbalized knowledge of HEP including use of theraband for UE strengthening.  Goal status: GOAL MET  LONG TERM GOALS: Target date: 09/12/2023   Pt will be independent with final for UE/LE HEP in order to improve strength and balance in order to decrease fall risk and improve function at home and work. Baseline: Patient is currently BLE NWB and unable to participate in HEP. Has order for UE strengthening. 01/11/2022- Patient now able  to participate in LE strengthening although NWB still- good understanding for some basic exercises. Will keep goal active to incorporate progressive LE strengthening exercises. 04/07/2022= Patient still participating in progressive LE seated HEP and has no questions at this time.  Goal status: MET  2.  Pt will improve FOTO to target score of 35  to display perceived improvements in ability to complete ADL's.  Baseline: 10/19/2021= 12; 12/02/2021= 17; 01/11/2022=17; 04/07/2022= 17; 07/05/2022=17; 10/06/2022- outcome measure not appropriate as patient in non-ambulatory and not consistently standing. Goal status: Goal not appropriate  3.  Pt will increase strength of B UE  by at least 1/2 MMT grade in order to demonstrate improvement in strength and function  Baseline: patient has range of 2-/5 to 4/5 BUE Strength; 12/02/2021= 4/5 except with wrist ext. 01/11/2022= 4/5 B UE strength expept for wrist Goal status: Goal revised-no longer appropriate- working with OT on all UE strengthening.   4. Pt. Will increase strength of RLE by at least 1/2 MMT grade in order to demonstrate improvement in Standing/transfers.  Baseline: 2-/5 Right hip  flex/knee ext/flex; 04/07/2022= 2- with right hip flex/knee ext (lacking 28 deg from zero) 4/8= Patient able to ext right knee lacking 18 deg from zero. 07/05/2022=Left knee approx 8 deg from zero and right knee = 23 deg from zero; 12/29/2022 - Left LE = full ROM 4-/5 and right knee ext= 23 deg from zero= 2+/5 (gravity minimized); 03/28/2023= Left knee ext=  4/5 and right knee ext= 3-/5 (able to move lower leg through incomplete ROM -lacking 20 deg from full ROM against gravity); 05/23/2023= right knee ext= 3-/5 (able to move lower leg through incomplete ROM -lacking 17 deg from full ROM against gravity); 06/20/2023= Right knee ext= lacking 20 deg from zero  Goal status: Progressing  5. Pt. Will demo ability to stand pivot transfer with max assist for improved functional mobility and less dependent need on mechanical device.   Baseline: Dependent on hoyer lift for all transfers; 04/07/2022- Unable to test secondary to patient with recent UTI and right hand procedure - will attempt to reassess next visit. ; 04/12/22: Unable to successfully stand due to feet plates on loaned power w/c; 05/10/2022- Patient still having to use rental chair and has not received her original power w/c back to practice SPT. 07/04/2021= Patient just received new power w/c and attempted standing from 1st time today- Patient able to stand with max assist today from w/c and did not attempt to perform pivot transfer- difficulty with static standing and placing weight on right LE. 10/06/2022- Not assessed today- patient needs new AFO prior to attempting transfers; 12/29/2022= Patient has not attempted SPT yet - still trying to improve LE strength enough to stand well without knee buckling with weight shifting. 03/28/2023- not attempted today due to ill fitting brace but patient last performed SPT on 01/10/23 and was able to perform with Max A with feet pre-positioned to angle toward direction of transfer; 05/23/2023- Not tested today but on last visit- patient  able to perform SPT with max assist yet concern for twisting knee as she was unable to turn/pivot foot .  06/20/2023- deferred to next session due to patient fatigued with static standing today.  Goal status: ONGOING  6. Pt. Will demonstrate improved functional LE strength as seen by ability to stand > 2 min for improved transfer ability and pregait abilities.   Baseline: not assessed today due to recent dx: UTI; 04/07/2022- Unable to test secondary to patient with recent  UTI and right hand procedure - will attempt to reassess next visit; 04/12/22: Unable to successfully stand due to feet plates on loaned power w/c 05/10/2022- Patient still having to use rental chair and has not received her original power w/c back to practice Standing. 07/05/2022- Patient able to stand with max assist at trunk today for 2 min 3 sec. Will keep goal active to ensure consistency. 10/06/2022= Unable to assess today- patient with some recent blood pressure issues and needs new AFO. 11/24/2022= Patient demonstrated multiple rounds of standing in // bars- holding up to 1:45 min today but has been able to exhibit 2 min in recent past- working on consistency. 12/29/2022= 2 min 40 sec with patient demonstrating improved overall posture and ability to contract glutes and quads with just min assist at trunk today.   Goal status: MET  7. Pt. Will demonstrate improved functional LE strength as seen by ability to stand > 5 min for improved transfer ability and pregait abilities. Baseline: 12/29/2022= 2 min 40 sec with patient demonstrating improved overall posture and ability to contract glutes and quads with just min assist at trunk today. 03/28/2023= Attempted 2 trials today - at best 2 min 48 sec today but during cert- did stand for 3 min 44 sec. 06/20/2023= Patient able to stand 2x (45 sec and 1 min 12 sec)- max assist yet no slippage of LLE.  07/13/2023= Patient able to stand x 3 trials today- each 1-2 min focusing on posture- patient with  improving gluteal activation and less physical assist once in standing position. Goal Status: Ongoing  8. Patient will perform sit to stand transfer with moderate assist consistently > 75% of time for improved transfer ability and less dependence on caregiver.   Baseline: 12/29/2022- Patient currently max assist level assistance with all sit to stand activities. 03/28/2023= Patient is still majority of max assist to stand yet able to pull up from wheelchair smoother in one motion. 06/20/2023- Max Assist but has only most recently resumed standing since last week so goal still appropriate for new cert; 1/61/0960=AVWUJ max assist but less overall assist on last trial of standing with improved forward lean and gluteal activation.   Goal status: ONGOING   ASSESSMENT:  CLINICAL IMPRESSION:   Patient reported feeling better and no pain with standing today. She was able to improve her standing posture- improved gluteal activation on 2nd and 3rd trial of standing and no slippage of left brace sliding forward today. Slow progress with recent return to standing but Patient's condition has the potential to improve in response to therapy. Maximum improvement is yet to be obtained. The anticipated improvement is attainable and reasonable in a generally predictable time.   Pt will continue to benefit from skilled PT services to optimize independence and reduced caregiver support.   OBJECTIVE IMPAIRMENTS Abnormal gait, decreased activity tolerance, decreased balance, decreased coordination, decreased endurance, decreased mobility, difficulty walking, decreased ROM, decreased strength, hypomobility, impaired flexibility, impaired UE functional use, postural dysfunction, and pain.   ACTIVITY LIMITATIONS carrying, lifting, bending, sitting, standing, squatting, sleeping, stairs, transfers, bed mobility, continence, bathing, toileting, dressing, self feeding, reach over head, hygiene/grooming, and caring for  others  PARTICIPATION LIMITATIONS: meal prep, cleaning, laundry, medication management, personal finances, interpersonal relationship, driving, shopping, community activity, and yard work  PERSONAL FACTORS Age, Time since onset of injury/illness/exacerbation, and 1-2 comorbidities: HTN, cervical Sx are also affecting patient's functional outcome.   REHAB POTENTIAL: Good  CLINICAL DECISION MAKING: Evolving/moderate complexity  EVALUATION COMPLEXITY: Moderate  PLAN: PT FREQUENCY: 1-2x/week  PT DURATION: 12 weeks  PLANNED INTERVENTIONS: Therapeutic exercises, Therapeutic activity, Neuromuscular re-education, Balance training, Gait training, Patient/Family education, Self Care, Joint mobilization, DME instructions, Dry Needling, Electrical stimulation, Wheelchair mobility training, Spinal mobilization, Cryotherapy, Moist heat, and Manual therapy  PLAN FOR NEXT SESSION:   Continue with LE and core strength training  Sit to stand and static standing SPT as able.     1:11 PM, 07/20/23  1:11 PM, 07/20/23 Ossie Blend, PT Physical Therapist - [facility]  Outpatient Physical Therapy- [facility] [phone]

## 2023-07-25 ENCOUNTER — Ambulatory Visit: Payer: Medicare HMO | Admitting: Occupational Therapy

## 2023-07-25 ENCOUNTER — Ambulatory Visit: Payer: Medicare HMO

## 2023-07-25 DIAGNOSIS — M6281 Muscle weakness (generalized): Secondary | ICD-10-CM

## 2023-07-25 DIAGNOSIS — S14129S Central cord syndrome at unspecified level of cervical spinal cord, sequela: Secondary | ICD-10-CM

## 2023-07-25 DIAGNOSIS — R2681 Unsteadiness on feet: Secondary | ICD-10-CM

## 2023-07-25 DIAGNOSIS — R296 Repeated falls: Secondary | ICD-10-CM

## 2023-07-25 DIAGNOSIS — S72002A Fracture of unspecified part of neck of left femur, initial encounter for closed fracture: Secondary | ICD-10-CM

## 2023-07-25 DIAGNOSIS — R278 Other lack of coordination: Secondary | ICD-10-CM

## 2023-07-25 DIAGNOSIS — R2689 Other abnormalities of gait and mobility: Secondary | ICD-10-CM

## 2023-07-25 DIAGNOSIS — R262 Difficulty in walking, not elsewhere classified: Secondary | ICD-10-CM
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
## 2023-07-27 ENCOUNTER — Ambulatory Visit: Payer: Medicare HMO | Admitting: Occupational Therapy

## 2023-07-27 ENCOUNTER — Ambulatory Visit: Payer: Medicare HMO

## 2023-08-01 ENCOUNTER — Ambulatory Visit: Payer: Medicare HMO

## 2023-08-01 ENCOUNTER — Ambulatory Visit: Payer: Medicare HMO | Admitting: Occupational Therapy

## 2023-08-02 ENCOUNTER — Encounter: Payer: Self-pay | Admitting: Nurse Practitioner

## 2023-08-02 ENCOUNTER — Non-Acute Institutional Stay (SKILLED_NURSING_FACILITY): Payer: Self-pay | Admitting: Nurse Practitioner

## 2023-08-02 DIAGNOSIS — G894 Chronic pain syndrome: Secondary | ICD-10-CM | POA: Diagnosis not present

## 2023-08-02 DIAGNOSIS — L210 Seborrhea capitis: Secondary | ICD-10-CM

## 2023-08-02 NOTE — Progress Notes (Signed)
 Location:  Other [facility] [place].  Nursing Home Room Number: [facility] 219A Place of Service:  SNF [phone]) Harlene An, NP  PCP: Abdul Fine, MD  Patient Care Team: Abdul Fine, MD as PCP - General (Family Medicine)  Extended Emergency Contact Information Primary Emergency Contact: [name],[name] Mobile Phone: [phone] Relation: Daughter Secondary Emergency Contact: Mathias Lynwood HERO Address: [address]          [place], [place] [address] [place] Home Phone: [phone] Mobile Phone: [phone] Relation: Spouse  Goals of care: Advanced Directive information    03/09/2023   12:39 PM  Advanced Directives  Does Patient Have a Medical Advance Directive? Yes  Type of Estate agent of [place];Out of facility DNR (pink MOST or yellow form);Living will  Does patient want to make changes to medical advance directive? No - Patient declined  Copy of Healthcare Power of Attorney in Chart? Yes - validated most recent copy scanned in chart (See row information)     Chief Complaint  Patient presents with   Itching    Itching and Pain.   Discussed the use of AI scribe software for clinical note transcription with the patient, who gave verbal consent to proceed.   HPI:  Pt is a 89 y.o. female seen today for an acute visit for Itching and Pain.  The patient presents with generalized body aches and scalp itching.  She experiences generalized body aches, particularly in the mornings and evenings, exacerbated by being rolled over multiple times for diaper changes and positioning. The pain is described as generalized, with specific mention of the hands and arms hurting, especially at night. Current medications include Tylenol  650 mg every eight hours as needed for pain, Cymbalta , and gabapentin .  She has significant scalp itching, described as severe and primarily located at the back of the scalp, associated with flakiness. She uses  ketoconazole shampoo once a week. No significant itching is reported elsewhere on the body apart from the scalp.  Her daily routine includes going to bed between 8 and 9 PM, though she does not fall asleep immediately due to discomfort. She wakes up around 8:30 AM, receiving morning medications after breakfast and evening medications around 7 PM.  Past Medical History:  Diagnosis Date   Acute blood loss anemia    Arthritis    Cancer (HCC)    skin   Central cord syndrome at C6 level of cervical spinal cord (HCC) 11/29/2017   Hypertension    Protein-calorie malnutrition, severe (HCC) 01/24/2018   S/P insertion of IVC (inferior vena caval) filter 01/24/2018   Tetraparesis (HCC)    Past Surgical History:  Procedure Laterality Date   ANTERIOR CERVICAL DECOMP/DISCECTOMY FUSION N/A 11/29/2017   Procedure: Cervical five-six, six-seven Anterior Cervical Decompression Fusion;  Surgeon: Cheryle Debby LABOR, MD;  Location: [facility] OR;  Service: Neurosurgery;  Laterality: N/A;  Cervical five-six, six-seven Anterior Cervical Decompression Fusion   CATARACT EXTRACTION     FEMUR IM NAIL Left 08/16/2021   Procedure: INTRAMEDULLARY (IM) NAIL FEMORAL;  Surgeon: Cleotilde Barrio, MD;  Location: [facility] ORS;  Service: Orthopedics;  Laterality: Left;   IR IVC FILTER PLMT / S&I /IMG GUID/MOD SED  12/08/2017    Allergies  Allergen Reactions   Other Swelling   Lisinopril-Hydrochlorothiazide Swelling    Swelling of the tongue   Beef-Derived Drug Products Swelling    Facial and lip swelling (due to tick bite)   Dairy Aid [Tilactase] Swelling   Lisinopril-Hydrochlorothiazide Swelling  Tongue swelling   Pork-Derived Products Swelling    Outpatient Encounter Medications as of 08/02/2023  Medication Sig   acetaminophen  (TYLENOL ) 500 MG tablet Take 500 mg by mouth every 12 (twelve) hours as needed for mild pain.   acetaminophen  (TYLENOL ) 650 MG CR tablet Take 650 mg by mouth every 8 (eight) hours as needed for pain.    acetic acid 0.25 % irrigation Use 30ml via irrigation twice daily to keep foley Cath patent.   alendronate (FOSAMAX) 70 MG tablet Take 70 mg by mouth every Saturday.   calcium  carbonate (CALCIUM  600) 600 MG TABS tablet Take 600 mg by mouth at bedtime.   Capsaicin  0.1 % CREA Apply 1 application  topically every evening. (Apply to hands)   cetirizine (ZYRTEC) 5 MG tablet Take 5 mg by mouth 2 (two) times daily.   cholecalciferol  (VITAMIN D3) 25 MCG (1000 UNIT) tablet Take 1,000 Units by mouth daily.   DULoxetine  (CYMBALTA ) 60 MG capsule Take 60 mg by mouth daily.   ferrous sulfate  325 (65 FE) MG tablet Take 325 mg by mouth daily with breakfast.   fluocinonide (LIDEX) 0.05 % external solution Apply 1 Application topically every 12 (twelve) hours as needed.   gabapentin  (NEURONTIN ) 100 MG capsule Take 1 capsule (100 mg total) by mouth daily.   hydroxypropyl methylcellulose / hypromellose (ISOPTO TEARS / GONIOVISC) 2.5 % ophthalmic solution Place 1 drop into both eyes every hour as needed for dry eyes.   ketoconazole (NIZORAL) 2 % shampoo Apply 1 Application topically as directed. Every shift on Thursday for seborrheic dermatitis flare up   lactulose , encephalopathy, (CHRONULAC ) 10 GM/15ML SOLN Take 30 mLs by mouth daily as needed (mild constipation).   magnesium  citrate solution Take 60-120 mLs by mouth daily as needed (constipation).   Menthol , Topical Analgesic, (BIOFREEZE COOL THE PAIN) 4 % GEL Apply 1 application  topically every 8 (eight) hours as needed (for left knee pain).   methylcellulose oral powder Take 1 packet by mouth at bedtime.   Multiple Vitamin (MULTIVITAMIN WITH MINERALS) TABS tablet Take 1 tablet by mouth daily.   olopatadine (PATADAY) 0.1 % ophthalmic solution Place 1 drop into both eyes 2 (two) times daily.   polyethylene glycol (MIRALAX  / GLYCOLAX ) 17 g packet Take 17 g by mouth daily as needed.   rivaroxaban  (XARELTO ) 10 MG TABS tablet Take 10 mg by mouth daily.    saccharomyces boulardii (FLORASTOR) 250 MG capsule Take 250 mg by mouth daily.   Saline Spray 0.2 % SOLN Place 2 sprays into both nostrils as needed (nasal congestion).   simethicone  (MYLICON) 80 MG chewable tablet Chew 80 mg by mouth every 6 (six) hours as needed for flatulence.   sodium phosphate  (FLEET) 7-19 GM/118ML ENEM Place 1 enema rectally every other day as needed for severe constipation.   spironolactone  (ALDACTONE ) 25 MG tablet Take 0.5 tablets (12.5 mg total) by mouth daily.   tiZANidine  (ZANAFLEX ) 2 MG tablet Take 1 tablet (2 mg total) by mouth 2 (two) times daily.   triamcinolone  cream (KENALOG ) 0.1 % Apply to thigh and lower legs topically every 8 hours as needed. Apply to back of neck topically as needed.   [DISCONTINUED] polyethylene glycol (MIRALAX  / GLYCOLAX ) 17 g packet Take 17 g by mouth 2 (two) times daily. (Patient taking differently: Take 17 g by mouth daily as needed.)   Infant Care Products (DERMACLOUD) OINT Apply to buttocks/gluteal folds topically every shift. (Patient not taking: Reported on 08/02/2023)   mometasone  (ELOCON ) 0.1 %  lotion Apply 1 Application topically every Saturday at 6 PM. (Apply to the ear canal for eczema) (Patient not taking: Reported on 08/02/2023)   Zinc Oxide (TRIPLE PASTE) 12.8 % ointment Apply 1 Application topically. Every shift. (Patient not taking: Reported on 08/02/2023)   No facility-administered encounter medications on file as of 08/02/2023.    Review of Systems  Constitutional:  Negative for activity change, appetite change, fatigue and unexpected weight change.  HENT:  Negative for congestion and hearing loss.   Eyes: Negative.   Respiratory:  Negative for cough and shortness of breath.   Cardiovascular:  Negative for chest pain, palpitations and leg swelling.  Gastrointestinal:  Negative for abdominal pain, constipation and diarrhea.  Genitourinary:  Negative for difficulty urinating and dysuria.  Musculoskeletal:  Positive for  arthralgias and myalgias.  Skin:  Negative for color change and wound.       Itchy scalp  Neurological:  Positive for weakness. Negative for dizziness.  Psychiatric/Behavioral:  Negative for agitation, behavioral problems and confusion.     Immunization History  Administered Date(s) Administered   Influenza, High Dose Seasonal PF 11/17/2021   Influenza-Unspecified 11/14/2018, 11/20/2019, 11/17/2020, 11/17/2021, 11/24/2022   Moderna Covid-19 Fall Seasonal Vaccine 19yrs & older 05/11/2022, 05/13/2023   Moderna Covid-19 Vaccine Bivalent Booster 69yrs & up 06/30/2021, 12/11/2021, 10/29/2022   Moderna Sars-Covid-2 Vaccination 02/09/2019, 03/09/2019, 12/14/2019, 06/20/2020, 10/24/2020   PPD Test 05/16/2019   Tdap 11/29/2017   Zoster Recombinant(Shingrix) 09/02/2022, 12/04/2022   Pertinent  Health Maintenance Due  Topic Date Due   INFLUENZA VACCINE  09/02/2023   DEXA SCAN  Discontinued      08/19/2021   10:00 AM 08/19/2021    8:54 PM 08/20/2021    8:20 AM 03/29/2022   10:11 AM 08/31/2022    2:42 PM  Fall Risk  Falls in the past year?    0 0  Was there an injury with Fall?    0   Fall Risk Category Calculator    0   (RETIRED) Patient Fall Risk Level High fall risk  High fall risk  High fall risk     Patient at Risk for Falls Due to    No Fall Risks      Data saved with a previous flowsheet row definition   Functional Status Survey:    Vitals:   08/02/23 1355  BP: 116/66  Pulse: 72  Resp: 18  Temp: 97.8 F (36.6 C)  SpO2: 94%  Weight: 190 lb (86.2 kg)  Height: 5' 7 (1.702 m)   Body mass index is 29.76 kg/m. Physical Exam Constitutional:      Appearance: Normal appearance.  Pulmonary:     Effort: Pulmonary effort is normal.   Neurological:     Mental Status: She is alert. Mental status is at baseline.   Psychiatric:        Mood and Affect: Mood normal.     Labs reviewed: Recent Labs    11/04/22 0000 05/09/23 0000 06/03/23 0000  NA 139 140 136*  K 3.8 5.1  4.3  CL 100 101 99  CO2 32* 33* 32*  BUN 9 11 9   CREATININE 0.5 0.6 0.5  CALCIUM  8.8 9.3 8.9   Recent Labs    09/29/22 0000 05/09/23 0000  AST 20 21  ALT 14 16  ALKPHOS 58 59  ALBUMIN  3.6 3.6   Recent Labs    09/30/22 0000 05/09/23 0000  WBC 4.7 4.6  NEUTROABS 2,627.00 2,272.00  HGB 13.2 12.0  HCT  41 38  PLT 280 296   Lab Results  Component Value Date   TSH 1.31 09/29/2022   No results found for: HGBA1C No results found for: CHOL, HDL, LDLCALC, LDLDIRECT, TRIG, CHOLHDL  Significant Diagnostic Results in last 30 days:  No results found.  Assessment/Plan 1. Seborrhea capitis (Primary) Increase ketoconazole shampoo to twice weekly  2. Chronic pain syndrome Ongoing, will increase tylenol  1000 mg by mouth three times daily due to chronic pain    Toya Palacios K. Caro BODILY [facility] [phone]

## 2023-08-03 ENCOUNTER — Ambulatory Visit: Payer: Medicare HMO

## 2023-08-03 ENCOUNTER — Ambulatory Visit: Payer: Medicare HMO | Admitting: Occupational Therapy

## 2023-08-08 ENCOUNTER — Ambulatory Visit: Payer: Medicare HMO

## 2023-08-08 ENCOUNTER — Ambulatory Visit: Payer: Medicare HMO | Admitting: Occupational Therapy

## 2023-08-10 ENCOUNTER — Ambulatory Visit: Payer: Medicare HMO

## 2023-08-10 ENCOUNTER — Ambulatory Visit: Payer: Medicare HMO | Attending: Internal Medicine | Admitting: Occupational Therapy

## 2023-08-10 DIAGNOSIS — R2689 Other abnormalities of gait and mobility: Secondary | ICD-10-CM | POA: Insufficient documentation

## 2023-08-10 DIAGNOSIS — S72001A Fracture of unspecified part of neck of right femur, initial encounter for closed fracture: Secondary | ICD-10-CM | POA: Insufficient documentation

## 2023-08-10 DIAGNOSIS — S14129S Central cord syndrome at unspecified level of cervical spinal cord, sequela: Secondary | ICD-10-CM | POA: Insufficient documentation

## 2023-08-10 DIAGNOSIS — M6281 Muscle weakness (generalized): Secondary | ICD-10-CM | POA: Insufficient documentation

## 2023-08-10 DIAGNOSIS — R262 Difficulty in walking, not elsewhere classified: Secondary | ICD-10-CM | POA: Insufficient documentation

## 2023-08-10 DIAGNOSIS — R2681 Unsteadiness on feet: Secondary | ICD-10-CM | POA: Insufficient documentation

## 2023-08-10 DIAGNOSIS — S72002A Fracture of unspecified part of neck of left femur, initial encounter for closed fracture: Secondary | ICD-10-CM | POA: Insufficient documentation

## 2023-08-10 DIAGNOSIS — R278 Other lack of coordination: Secondary | ICD-10-CM | POA: Insufficient documentation

## 2023-08-10 DIAGNOSIS — R296 Repeated falls: Secondary | ICD-10-CM | POA: Insufficient documentation

## 2023-08-10 LAB — BASIC METABOLIC PANEL WITH GFR
BUN: 10 (ref 4–21)
CO2: 27 — AB (ref 13–22)
Chloride: 99 (ref 99–108)
Creatinine: 0.5 (ref 0.5–1.1)
Glucose: 123
Potassium: 4.5 meq/L (ref 3.5–5.1)
Sodium: 133 — AB (ref 137–147)

## 2023-08-10 LAB — CBC AND DIFFERENTIAL
HCT: 36 (ref 36–46)
Hemoglobin: 11.8 — AB (ref 12.0–16.0)
Neutrophils Absolute: 4038
Platelets: 280 K/uL (ref 150–400)
WBC: 6.3

## 2023-08-10 LAB — CBC: RBC: 3.8 — AB (ref 3.87–5.11)

## 2023-08-10 LAB — COMPREHENSIVE METABOLIC PANEL WITH GFR
Calcium: 8.9 (ref 8.7–10.7)
eGFR: 90

## 2023-08-10 NOTE — Therapy (Signed)
 [facility] Outpatient Rehabilitation at [facility] [address] [place], [place], [address] Phone: [phone]   Fax:  [phone]  Patient Details  Name: Sherry [name] MRN: [id number] Date of Birth: [birth year] Referring Provider:  Abdul Fine, MD  Encounter Date: 08/10/2023  Pt. arrived for her OT appointment this morning, however reported feeling dizzy, having visual changes, and a dry mouth. BP: 74/52  HR: 64 bpms. The rapid response team was called to assess the Pt. Pt. was assisted to a reclining position. Pt.'s caregiver gave her Gatorade to drink. Pt. reports that she typically drinks Gatorade to bring her BP back up. BP: 95/61, HR: 72 bpms. Pt. declined to go to the ER for further evaluation. Pt. reported wanting to go back to [facility] to be assessed there. Pt. attempted to sit more up right. BP: 79/54 HR: 63 bpms, BP finally increased to 107/70 HR: 81 bpms. Pt. was able to return to [facility] with her caregiver.   Richardson Otter, MS, OTR/L 08/10/2023, 1:27 PM   [facility] Outpatient Rehabilitation at [facility] [address] [place], [place], [address] Phone: [phone]   Fax:  [phone]

## 2023-08-15 ENCOUNTER — Ambulatory Visit: Payer: Medicare HMO | Admitting: Occupational Therapy

## 2023-08-15 ENCOUNTER — Ambulatory Visit: Payer: Medicare HMO

## 2023-08-15 DIAGNOSIS — R278 Other lack of coordination: Secondary | ICD-10-CM | POA: Diagnosis present

## 2023-08-15 DIAGNOSIS — M6281 Muscle weakness (generalized): Secondary | ICD-10-CM | POA: Diagnosis present

## 2023-08-15 DIAGNOSIS — R2689 Other abnormalities of gait and mobility: Secondary | ICD-10-CM | POA: Diagnosis present

## 2023-08-15 DIAGNOSIS — S72002A Fracture of unspecified part of neck of left femur, initial encounter for closed fracture: Secondary | ICD-10-CM | POA: Diagnosis present

## 2023-08-15 DIAGNOSIS — R296 Repeated falls: Secondary | ICD-10-CM | POA: Diagnosis present

## 2023-08-15 DIAGNOSIS — R262 Difficulty in walking, not elsewhere classified: Secondary | ICD-10-CM

## 2023-08-15 DIAGNOSIS — S14129S Central cord syndrome at unspecified level of cervical spinal cord, sequela: Secondary | ICD-10-CM | POA: Diagnosis present

## 2023-08-15 DIAGNOSIS — S72001A Fracture of unspecified part of neck of right femur, initial encounter for closed fracture: Secondary | ICD-10-CM | POA: Diagnosis present

## 2023-08-15 DIAGNOSIS — R2681 Unsteadiness on feet: Secondary | ICD-10-CM | POA: Diagnosis present

## 2023-08-15 NOTE — Therapy (Addendum)
 Occupational Therapy Treatment Note  Patient Name: Sherry [name] MRN: [id number] DOB:04/24/1934, 89 y.o., female Today's Date: 11/29/2022  PCP:  [facility] PROVIDER: Abdul Fine, MD  END OF SESSION:   OT End of Session - 08/15/23 1531     Visit Number [id number]    Number of Visits 192    Date for OT Re-Evaluation 10/05/23    OT Start Time 1100    OT Stop Time 1145    OT Time Calculation (min) 45 min    Activity Tolerance Patient tolerated treatment well    Behavior During Therapy [facility] for tasks assessed/performed               Past Medical History:  Diagnosis Date   Acute blood loss anemia    Arthritis    Cancer (HCC)    skin   Central cord syndrome at C6 level of cervical spinal cord (HCC) 11/29/2017   Hypertension    Protein-calorie malnutrition, severe (HCC) 01/24/2018   S/P insertion of IVC (inferior vena caval) filter 01/24/2018   Tetraparesis (HCC)    Past Surgical History:  Procedure Laterality Date   ANTERIOR CERVICAL DECOMP/DISCECTOMY FUSION N/A 11/29/2017   Procedure: Cervical five-six, six-seven Anterior Cervical Decompression Fusion;  Surgeon: Cheryle Debby DELENA, MD;  Location: [facility] OR;  Service: Neurosurgery;  Laterality: N/A;  Cervical five-six, six-seven Anterior Cervical Decompression Fusion   CATARACT EXTRACTION     FEMUR IM NAIL Left 08/16/2021   Procedure: INTRAMEDULLARY (IM) NAIL FEMORAL;  Surgeon: Cleotilde Barrio, MD;  Location: [facility] ORS;  Service: Orthopedics;  Laterality: Left;   IR IVC FILTER PLMT / S&I /IMG GUID/MOD SED  12/08/2017   Patient Active Problem List   Diagnosis Date Noted   Age-related osteoporosis without current pathological fracture 07/27/2022   Mild recurrent major depression (HCC) 07/27/2022   Hypertensive heart disease with other congestive heart failure (HCC) 02/14/2022   Chronic indwelling Foley catheter 02/14/2022   Closed hip fracture (HCC) 08/15/2021   DVT (deep venous thrombosis) (HCC) 08/15/2021   Quadriplegia  (HCC) 08/15/2021   Fall 08/15/2021   Constipation due to slow transit 08/31/2018   Trauma 06/05/2018   Neuropathic pain 06/05/2018   Neurogenic bowel 06/05/2018   Vaginal yeast infection 01/30/2018   Healthcare-associated pneumonia 01/25/2018   Chronic allergic rhinitis 01/24/2018   Depression with anxiety 01/24/2018   UTI due to Klebsiella species 01/24/2018   Protein-calorie malnutrition, severe (HCC) 01/24/2018   S/P insertion of IVC (inferior vena caval) filter 01/24/2018   Tetraparesis (HCC) 01/20/2018   Neurogenic bladder 01/20/2018   Reactive depression    Benign essential HTN    Acute postoperative anemia due to expected blood loss    Central cord syndrome at C6 level of cervical spinal cord (HCC) 11/29/2017   Allergy to alpha-gal 11/25/2016   SCC (squamous cell carcinoma) 04/08/2014   ONSET DATE: 11/29/2017  REFERRING DIAG: Central Cord Syndrome at C6 of the Cervical Spinal Cord, Fall with Bilateral Closed Hip Fractures, with ORIF repair with Intramedullary Nailing of the Left Hip Fracture.   THERAPY DIAG:  Muscle weakness (generalized)  Other lack of coordination  Rationale for Evaluation and Treatment: Rehabilitation  SUBJECTIVE:  SUBJECTIVE STATEMENT:  Pt. reports that she could benefit from use of reacher when dropping the remote at home.   Pt accompanied by: self, Personal Care aide  PERTINENT HISTORY: Central Cord Syndrome at C6 of the Cervical Spinal Cord, Fall with Bilateral Closed Hip Fractures, with ORIF repair with Intramedullary Nailing of the Left Hip  Fracture.   PRECAUTIONS: None  WEIGHT BEARING RESTRICTIONS: No  PAIN:  Are you having pain? No pain   LIVING ENVIRONMENT: Lives with: [facility] Stairs: No Has following equipment at home: Wheelchair (power)  PLOF: Independent   PATIENT GOALS:  See below for established goals  OBJECTIVE:  Note: Objective measures were completed at Evaluation unless otherwise  noted.  HAND DOMINANCE: Right  ADLs: Caregiver assist with ADLs, and [facility]  MOBILITY STATUS: Uses a power w/c  ACTIVITY TOLERANCE: Activity tolerance: Fair  FUNCTIONAL OUTCOME MEASURES:  Measurements:   12/09/2021:   Shoulder flexion: R: 120(136), L: 138(150) Shoulder abduction: R: 108(120), L: 110(120) Elbow: R: 0-148, L: 0-146 Wrist flexion: R: 55(62), L: 78 Writ extension: R: 34(55), L: 3(4) RD: R: 12(20), L: 16(26) UD: R: 8(24), L: 6(18) Thumb radial abduction: R: 11(20), L: 8(20) Digit flexion to the [facility]: R:  2nd: 5cm(3.5cm), 3rd: 7.5 cm(5cm), 4th: 7cm(3cm), 5th: 6cm(5.5cm) L: 2nd: 8cm(8cm), 3rd: 8cm(8cm), 4th: 7.5cm(7cm), 5th: 7cm(7cm)   02/03/2022:   Shoulder flexion: R: 122(138), L: 148(154) Shoulder abduction: R: 109(125), L: 125(125 Elbow: R: 0-148, L: 0-146 Wrist flexion: R: 60(68), L: 79 Writ extension: R: 40(55), L: 3(10) RD: R: 14(24), L: 20(28) UD: R: 8(24), L: 6(18) Thumb radial abduction: R: 20(25), L: 8(20) Digit flexion to the [facility]: R:  2nd: 4.5cm(3cm), 3rd: 6.5cm(4.5cm), 4th: 6.5 cm(3cm), 5th: 4.5cm(5cm) L: 2nd: 8cm(8cm), 3rd: 8cm(7cm), 4th: 7.5cm(7cm), 5th: 6.5cm(6.5cm)   04/07/2022:   Shoulder flexion: R: 125(150), L: 160(160) Shoulder abduction: R: 109(125), L: 125(125 Elbow: R: 0-148, L: 0-146 Wrist flexion: R: 60(68), L: 79 Writ extension: R: 40(55), L: 5(18) RD: R: 14(24), L: 20(28) UD: R: 18(24), L: 8(18) Thumb radial abduction: R: 20(25), L: 8(20) Digit flexion to the [facility]: R:  2nd: 4.5 cm(4 cm), 3rd: 6.5cm(3cm), 4th: 7 cm (5cm), 5th: 5cm(5cm) L: 2nd: 8cm(8cm), 3rd: 8cm(7cm), 4th: 7.5cm(7cm), 5th: 7cm(7cm)   07/05/2022:   Shoulder flexion: R: 125(154), L: 160(160) Shoulder abduction: R: 95(130), L: 143(143) Elbow: R: 0-148, L: 0-146 Wrist flexion: R: 60(68), L: 79 Writ extension: R: 40(60), L: 0(12) RD: R: 10(24), L: 12(20) UD: R: 16(24), L: 8(16) Thumb radial abduction: R: 20(25), L: 8(20) Digit flexion to the  [facility]: R:  2nd: 4 cm(4cm), 3rd: 6.5cm( 3.5cm), 4th: 7 cm (5cm), 5th: 6cm(5cm) L: 2nd: 8cm(8cm), 3rd: 8cm(7cm), 4th: 8cm(7cm), 5th: 7cm(7cm)   09/13/2022:   Shoulder flexion: R: 125(154), L: 160(160) Shoulder abduction: R: 100(130), L: 143(143) Elbow: R: 0-150, L: 0-150 Wrist flexion: R: 55(68), L: 79 Writ extension: R: 45(60), L: 0(12) RD: R: 10(24), L: 12(20) UD: R: 16(24), L: 8(16) Thumb radial abduction: R: 20(25), L: 8(20) Digit flexion to the [facility]: R:  2nd: 4 cm(4cm), 3rd: 6cm( 5cm), 4th: 6 cm (4cm), 5th: 5cm(5cm) L: 2nd: 7cm(cm), 3rd: 7cm(6cm), 4th: 7cm(6cm), 5th: 6cm(6cm)   10/27/2022   Shoulder flexion: R: 128(154), L: 160(160) Shoulder abduction: R: 105(130), L: 143(143) Elbow: R: 0-150, L: 0-150 Wrist flexion: R: 55(68), L: 79 Writ extension: R: 46(64), L: 0(20) RD: R: 10(20), L: 18(24) UD: R: 18(28), L: 8(20) Thumb radial abduction: R: 30(42), L:20(24) Digit flexion to the [facility]: R:  2nd: 4 cm(3cm), 3rd: 6cm( 6cm), 4th: 6 cm (5cm), 5th: 5cm(5cm) L: 2nd: 8cm(8cm), 3rd: 8cm(6cm), 4th: 8cm(7cm), 5th: 6cm(6cm)  01/19/2023  Shoulder flexion: R: 131(154), L: 160(160) Shoulder abduction: R: 110(130), L: 143(143) Elbow: R: 0-150, L: 0-150 Wrist flexion: R: 55(68), L: 79 Writ extension: R: 46(64), L: 0(20) RD:  R: 10(20), L: 18(24) UD: R: 18(28), L: 14(20) Thumb radial abduction: R: 38(48), L:24(26) Thumb IP flexion: R: 35(50) L: 20(45) Digit flexion to the [facility]: R:  2nd: 3.5 cm(3cm), 3rd: 6cm( 6cm), 4th: 6 cm (5cm), 5th: 5cm(4.5cm) L: 2nd: 8cm(7.5cm), 3rd: 8cm(6cm), 4th: 7.5cm(6cm), 5th: 6cm(4.5cm)  04/04/2023  Shoulder flexion: R: 134(154), L: 160(160) Shoulder abduction: R: 114(130), L: 143(143) Elbow: R: WNL, L: WNL Wrist flexion: R: 55(68), L: 80 Writ extension: R: 46(64), L: -10(10) RD: R: 14(24), L: 20(24) UD: R: 20(30), L: 14(20) Thumb radial abduction: R: 38(48), L:28(30) Thumb IP flexion: R: 40(50) L: 25(45) Digit flexion to the [facility]: R:  2nd: 3.5 cm(3cm),  3rd: 6cm( 5cm), 4th: 6 cm (4cm), 5th: 5cm(5cm) L: 2nd: 8cm(7.5cm), 3rd: 8cm(7cm), 4th: 7.5cm(7cm), 5th: 7cm(6cm)  05/25/2023  Shoulder flexion: R: 134(154), L: 160(160) Shoulder abduction: R: 117(134), L: 143(143) Elbow: R: WNL, L: WNL Wrist flexion: R: 55(68), L: 80 Wrist extension: R: 46(64), L: -8(14) RD: R: 14(24), L: 20(24) UD: R: 20(32), L: 14(22) Thumb radial abduction: R: 38(50), L:28(30) Thumb IP flexion: R: 40(50) L: 30(45) Digit flexion to the [facility]: R:  2nd: 3.5 cm(3cm), 3rd: 6cm( 5cm), 4th: 7 cm (4cm), 5th: 5cm(4cm) L: 2nd: 8cm(7.5cm), 3rd: 8cm(7cm), 4th: 7.5cm(7cm), 5th: 7cm(6cm)  07/13/23  Shoulder flexion: R: 134(154), L: 160(160) Shoulder abduction: R: 122(134), L: 143(143) Elbow: R: WNL, L: WNL Wrist flexion: R: 55(68), L: 80 Wrist extension: R: 50(64), L: -4(18) RD: R: 18(24), L: 24(24) UD: R: 20(32), L: 14(22) Thumb radial abduction: R: 38(50), L:3230) Thumb IP flexion: R: 42(50) L: 30(45) Digit flexion to the [facility]: R:  2nd: 3.5 cm(3cm), 3rd: 6cm( 5cm), 4th: 7 cm (4cm), 5th: 5cm(4cm) L: 2nd: 8cm(7.5cm), 3rd: 8cm(7cm), 4th: 7.5cm(7cm), 5th: 7cm(6cm)   COORDINATION:   01/19/2023:  9 hole peg test:  Right: 2 pegs placed in 1 min. & 5 sec.  Pt. was able to remove 9 pegs in 17 sec.  Left: 2 pegs placed in 1 min. & 10 sec. Pt. was able to remove 9 pegs in 20 sec.  04/04/23:  Pt. was able to consistently grasp the pegs however each one slipped out of her fingers when attempting to place them into the Pegboard.  05/25/2023:   Right: 9 pegs placed and removed in 2 min. & 41 sec. Left: Unable to grasp the pegs for a horizontal position.  07/13/23    Right: 9 pegs placed and removed in 2 min. & 11 sec.   SENSATION: Intact  EDEMA: N/A   COGNITION: Overall cognitive status: WNL   VISION: Subjective report: Wears glasses, No changes in vision.  PERCEPTION: Intact  TODAY'S TREATMENT:                                                                                                                               DATE:  08/15/2023  Therapeutic Ex. :   -Pt. tolerated PROM followed by AROM bilateral wrist extension, PROM  for bilateral digit MP, PIP, and DIP flexion, and extension, thumb abduction following moist heat modality for 5 min.  -Pt. performed 2 sets of 3# dowel exercises for 2 sets 10 reps each for bilateral shoulder flexion, chest press, circular patterns, and elbow flexion/extension. Pt. Worked on reaching for targets with the bilateral UEs reaching for targets to promote diagonal patterns and crossing midline. BUE strengthening was performed secondary to weakness.   Self-Care:   -Pt. Educated and returned demonstration of 32 reacher to grab items from floor level and lower/raised surfaces. -The reacher trigger was modified for easier control with the right hand -Pt. Used 32 reacher to bring side table closer towards her body to demonstrate proper use of reacher.          PATIENT EDUCATION: Education details: Bilateral hand and shoulder ROM, and reacher use Person educated: Patient Education method: Explanation, Demonstration, and Verbal cues Education comprehension: verbalized understanding and returned demonstration  HOME EXERCISE PROGRAM: Continue to assess HEP needs, and provide as needed.   GOALS: Goals reviewed with patient? Yes  LONG TERM GOALS: Target date: 10/05/2023  3.  Patient will independently use a reacher to pick up items from various surfaces.  Baseline: 07/13/2023: Pt. Is starting to use a reacher to retrieve items with an adaptation/modification to the trigger.05/25/2023:  Pt. is now able to initiate grasping a reacher to open and close the reacher with the right hand, however requires the grasp handle to be repositioned  within the hand. Pt. requires work on using/moving the reacher with the right hand through various planes. 04/04/2023: Pt. continues to work on improving Digit flexion to the [facility] to be able to  efficiently handle Adaptive equipment. 01/19/2023: Pt. Is improving with Bilateral digit flexion to the [facility]. Pt. has difficulty firmly holding adaptive devices. 12/06/2022: Pt. continues to present with increased MP, PIP, and DIP digit tightness/stiffness limiting the formulation of bilateral composites fists. 10/27/2022: Pt. presents with increased MP, PIP, and DIP digit tightness/stiffness limiting the formulation of bilateral composites fists during this progress reporting period limiting. Digit flexion to the [facility]: R:  2nd: 4 cm(3cm), 3rd: 6cm( 6cm), 4th: 6 cm (5cm), 5th: 5cm(5cm), L: 2nd: 8cm(8cm), 3rd: 8cm(6cm), 4th: 8cm(7cm), 5th: 6cm(6cm) 09/20/2022: Improving digit flexion to the [facility]. 09/13/22: improved digit flexion to the [facility]. 07/05/2022: Pt. presents with digit MP, PIP, and DIP extensor tightness limiting her ability to securely grip objects in her bilateral hands.  04/07/2022: Pt. Has improved with right 2nd, and 3rd digit flexion towards the [facility]. Pt. Continues to have difficulty securely holding and applying deodorant.02/03/2022: Pt. Has improved with digit flexion, however, continues to have difficulty securely holding and using deodorant. 12/09/2021: Bilateral hand/digit MP, PIP, and DIP extension tightness limits her ability achieve digit flexion to hold, and apply deodorant. 10/28/2021:  Pt. continues to have difficulty holding the deodorant. 01/12/2021: Pt. Presents with limited digit extension. Pt. Is able to initiate holding deodorant, however is unable to hold it while using it  Goal status: Ongoing   4.  Pt. Will improve bilateral wrist extension in preparation for anticipating, and initiating reaching for objects at the table.  Baseline: 07/13/23:  Wrist extension: R: 50(64), L: -4(18) 05/25/23: Wrist extension: R: 46(64), L: -8(14) 04/04/23: R: 46(64) L: -10(10)01/19/2023:  R: 46(64) L: 0(20) 12/06/2022: Pt continues to to progress with bilateral wrist extension in preparation fro functional  reaching. 10/27/2022: Pt. Is improving with bilateral wrist extension. R: 46(64) L: 0(20) 09/20/2022: limited PROM in the Left wrist  extension 2/2 tightness/stiffness. 09/13/22: R: 55(68), L: 0(12) 07/19/2022: Bilateral wrist extension in limited. 07/05/2022: Right: 40(60) Left: 0(12) 04/07/2022: Right: 40(55) Left: 5(18) 02/03/2022: Right: 34(55) Left: 3(4) 12/09/2021: Right  34(55)  Left  3(4) 10/28/2021:  Right 22(38), Left 0(15) 09/14/2021:  Right: 17(35), left 2(15)  Goal status: Progressing/Ongoing  7.  Pt. Will increase bilateral lateral pinch strength by 2 lbs to be able to securely grasp items during ADLs, and IADL tasks.  Baseline: 01/19/2023: Deferred as the Pinch meter is out for calibration.12/06/2022: Pt. Is able to more securely hold objects during ADLs/IADLs 10/27/2022: Pt. Is improving with holding items with her bilateral thumbs. (Pinch meter out for calibration) 09/20/2022: lateral pinch continues to be limited 09/13/22: R 6.5# L 4# 07/19/2022: TBD 07/05/2022: TBD 07/2022: NT-Pinch meter out for calibration. 02/03/2022: Right: 6#, Left: 4# 12/09/2021: Right: 6#, Left: 4#  Goal status: Deferred   9.  Pt. will complete plant care with modified independence.  Baseline:12/06/2022: Pt. Is independent with plant care in her husband's room, however is not able to access her plants due to furniture blocking access to them. 10/27/2022: Pt. Is able to water plants that are closer, and within reach. Pt. continues to have difficulty reaching for thorough plant care. 09/20/2022: Pt. Continues to have difficulty reaching for thorough plant care. 09/13/22: continues to report intermittent difficulty 07/19/2022: Pt. Continues to be able to water, and care for some of her plants. Pt. Has more difficulty with plants that are harder to reach. 07/05/2022: pt. Is able to water, and care for some of her plants. Pt. Has more difficulty with plants that are harder to reach. 04/07/2022: Pt. is now able to hold a cup and water her  plants.02/03/2022: Pt. has difficulty caring for her plants.  Goal status:Achieved   10.  Pt. will demonstrate adaptive techniques to assist with the efficiency of self-dressing, or morning care tasks.  Baseline: 07/13/23:Pt. continues to work on improving UE functioning to be able to efficiently use Adaptive equipment 05/25/2023: Pt. continues to work on improving UE functioning to be able to efficiently use Adaptive equipment.  04/04/2023:  Pt. continues to work on improving UE functioning to be able to efficiently use Adaptive equipment. 01/19/2203: Pt. Is able to donn her jacket in reverse independently. Pt. requires assist from staff, as Pt. Reports staff have decreased time. 12/06/2022: Pt. requires assist from staff, as Pt. Reports staff have decreased time. 10/27/2022: Pt. is able to assist with initiating UE dressing. Pt. requires assist from personal, and staff care aides. 09/20/2022: Pt. continues to require assist form personal, and staff care aides. 09/13/22: MODA dressing, reports she is often rushed 07/19/2022: Pt. continues to require assist with self-dressing/morning care tasks. 07/05/2022: Continue with goal. 04/07/2022: Pt. Continues to require assist with the efficiency of self-dressing, and morning care tasks.02/03/2022: Pt. requires assist from caregivers 2/2 time limitations during morning care.  Goal status: Ongoing  11.  Pt. will improve BUE strength to improve ADL, and IADL functioning.  Baseline: 07/13/23: shoulder flexion L 4/5, R 4-/5; shoulder abduction L 4+/5, R 4-/5. elbow flexion B 5/5, elbow extension 4+/5 01/19/2023: L 4/5, R 3+/5  05/25/2023: shoulder flexion L 4/5, R 4-/5; shoulder abduction L 4+/5, R 4-/5. elbow flexion B 5/5, elbow extension 4+/5 01/19/2023: L 4/5, R 3+/5  04/04/2023: shoulder flexion L 4/5, R 3+/5; shoulder abduction L 4+/5, R 3+/5. elbow flexion B 5/5, elbow extension 4/5 01/19/2023: L 4/5, R 3+/5 12/06/2022: Continue 10/27/2022: shoulder flexion L 4/5, R 3+/5;  shoulder abduction L 4+/5, R 3+/5. elbow flexion B 5/5, elbow extension 4/5 09/20/2022: Pt. Presents with limited BUE strength 09/13/22: shoulder flexion L 4/5, R 3/5; elbow flexion B 5/5, elbow extension 4-/5; shoulder abduction L 4+/5, R 3+/5. 07/19/2022: BUE strength continues to be limited. 07/05/2022: shoulder flexion: right 4-/5, abduction: 3+/5, elbow flexion: right: 5/5, left 5/5, extension: right: 4-/5, left 4-/5, wrist extension: right: 3-/5, left: 2-/5  Goal status: Ongoing  12.  Pt. will improve bilateral thumb radial abduction in order to be able to hold the grab bars while standing with PT  Baseline: 07/13/23: Pt. Is improving with holding the grab bars with the right hand .05/25/23: Thumb radial abduction: R: 38(50), L:28(30) 04/04/23: R: 38(48), L:28(30) 01/19/2023: R: 38(48) L: 24(26)12/06/2022: Pt. Continues to present  with limited thumb abduction, however is improving holding onto the parallel bars.10/27/2022: thumb radial abduction: R: 30(42), L: 20(24  Goal status: Ongoing  13.  Pt. Will be able to securely hold, and stabilize medication bottles at a tabletop surface when opening, and closing them.  Baseline: 01/19/2023: Pt. Is now able to securely hold medication bottles while opening them, and stabilizes them on surfaces while opening them. 12/06/2022: Pt is improving with securely stabilizing medication bottles. 10/27/2022: Pt. Stabilizes bottles against her torso when attempting to open, and close them   Goal status: Achieved  14: Pt. Will improve bilateral thumb IP flexion to improve active grasping patterns.    Baseline: 07/13/23: Thumb IP flexion: R: 42(50) L: 30(45) 05/25/2023: Thumb IP flexion: R: 40(50) L: 30(45) 04/04/2023:  R: 40(50) L: 25(45) 01/19/2023: Right 35(50), Left: 20/45)    Goal status: Ongoing    15. Pt. Will improve bilateral FMC/speed, and dexterity. As evidence by improved scores on the 9 hole peg test.  Baseline: 07/13/2023: Right: 9 pegs placed and removed in 2  min. & 11 sec. 05/25/2023: 9 Hole pegs test speed/dexterity: Right: 9 pegs placed and removed in 2 min. & 41 sec. Left: Unable to grasp the pegs for a horizontal position. 04/04/23: Pt. was able to consistently grasp the pegs however each one slipped out of her fingers when attempting to place them into the Pegboard. 01/19/2023: 2 pegs placed in 1 min. & 5 sec.  Pt. was able to remove 9 pegs in 17 sec.; Left: 2 pegs placed in 1 min. & 10 sec. Pt. was able to remove 9 pegs in 20 sec.   Goal status: Ongoing  ASSESSMENT:  CLINICAL IMPRESSION:   Pt. Tolerated treatment session today. Pt. BP and heart rate was taken prior to treatment session, 108/61, heart rate was 77 bpms. Pt tolerated increased BUE strengthening resistance using 3# dowel for 2 sets of exercises. Pt. Presents with fatigue during 2nd set of strengthening exercises, requiring rest break for water. Pt. Tolerated the use of a 32 reacher to grasp objects from floor level. Pt returned demonstration of proper use of reacher during treatment session while picking up objects from floor level, and transferring items from reacher into L hand. Pt. Requires modification to 32 reacher to shorten the clasp of trigger so that it is easier to grasp. Pt. continues to benefit form OT services to work on impoving ROM, and UE strength in order to work towards increasing bilateral hand grasp on objects, and increasing engagement of bilateral hands during ADLs, and IADL tasks.      PERFORMANCE DE PERFORMANCE DEFICITS: in functional skills including ADLs, IADLs, coordination, dexterity, ROM, strength, and UE functional use, cognitive skills including ,  and psychosocial skills including coping strategies and environmental adaptation.   IMPAIRMENTS: are limiting patient from ADLs, IADLs, and leisure.   CO-MORBIDITIES: may have co-morbidities  that affects occupational performance. Patient will benefit from skilled OT to address above impairments and improve overall  function.  MODIFICATION OR ASSISTANCE TO COMPLETE EVALUATION: Min-Moderate modification of tasks or assist with assess necessary to complete an evaluation.  OT OCCUPATIONAL PROFILE AND HISTORY: Detailed assessment: Review of records and additional review of physical, cognitive, psychosocial history related to current functional performance.  CLINICAL DECISION MAKING: Moderate - several treatment options, min-mod task modification necessary  REHAB POTENTIAL: Good for stated goals  PLAN:  OT FREQUENCY 2x's a week  OT DURATION: 12 weeks  PLANNED INTERVENTIONS ADL training, A/E training, UE ther. Ex, Manual therapy, neuromuscular re-education, moist heat modality, Paraffin Bath, Splinting, and  Pt./caregiver education   RECOMMENDED OTHER SERVICES: PT  CONSULTED AND AGREED WITH PLAN OF CARE: Patient  PLAN FOR NEXT SESSION: Continue Treatment as per established POC  Damien Nap, OTS   This entire session was performed under direct supervision and direction of a licensed therapist/therapist assistant . I have personally read, edited and approve of the note as written.   Richardson Otter, MS, OTR/L   08/15/23, 3:35 PM

## 2023-08-16 NOTE — Therapy (Signed)
 OUTPATIENT PHYSICAL THERAPY NEURO TREATMENT  Patient Name: Sherry [name] MRN: [id number] DOB:1935-01-09, 89 y.o., female Today's Date: 08/16/2023   PCP: Richerd Brigham, MD REFERRING PROVIDER: Cleotilde Barrio   PT End of Session - 08/15/23 1148     Visit Number [id number]    Number of Visits 139    Date for PT Re-Evaluation 09/12/23    Progress Note Due on Visit 120    PT Start Time 1145    PT Stop Time 1220    PT Time Calculation (min) 35 min    Equipment Utilized During Treatment Gait belt    Activity Tolerance Patient tolerated treatment well;No increased pain    Behavior During Therapy WFL for tasks assessed/performed                         Past Medical History:  Diagnosis Date   Acute blood loss anemia    Arthritis    Cancer (HCC)    skin   Central cord syndrome at C6 level of cervical spinal cord (HCC) 11/29/2017   Hypertension    Protein-calorie malnutrition, severe (HCC) 01/24/2018   S/P insertion of IVC (inferior vena caval) filter 01/24/2018   Tetraparesis (HCC)    Past Surgical History:  Procedure Laterality Date   ANTERIOR CERVICAL DECOMP/DISCECTOMY FUSION N/A 11/29/2017   Procedure: Cervical five-six, six-seven Anterior Cervical Decompression Fusion;  Surgeon: Cheryle Debby DELENA, MD;  Location: [facility] OR;  Service: Neurosurgery;  Laterality: N/A;  Cervical five-six, six-seven Anterior Cervical Decompression Fusion   CATARACT EXTRACTION     FEMUR IM NAIL Left 08/16/2021   Procedure: INTRAMEDULLARY (IM) NAIL FEMORAL;  Surgeon: Cleotilde Barrio, MD;  Location: [facility] ORS;  Service: Orthopedics;  Laterality: Left;   IR IVC FILTER PLMT / S&I /IMG GUID/MOD SED  12/08/2017   Patient Active Problem List   Diagnosis Date Noted   Age-related osteoporosis without current pathological fracture 07/27/2022   Mild recurrent major depression (HCC) 07/27/2022   Hypertensive heart disease with other congestive heart failure (HCC) 02/14/2022   Chronic indwelling  Foley catheter 02/14/2022   Closed hip fracture (HCC) 08/15/2021   DVT (deep venous thrombosis) (HCC) 08/15/2021   Quadriplegia (HCC) 08/15/2021   Fall 08/15/2021   Constipation due to slow transit 08/31/2018   Trauma 06/05/2018   Neuropathic pain 06/05/2018   Neurogenic bowel 06/05/2018   Vaginal yeast infection 01/30/2018   Healthcare-associated pneumonia 01/25/2018   Chronic allergic rhinitis 01/24/2018   Depression with anxiety 01/24/2018   UTI due to Klebsiella species 01/24/2018   Protein-calorie malnutrition, severe (HCC) 01/24/2018   S/P insertion of IVC (inferior vena caval) filter 01/24/2018   Tetraparesis (HCC) 01/20/2018   Neurogenic bladder 01/20/2018   Reactive depression    Benign essential HTN    Acute postoperative anemia due to expected blood loss    Central cord syndrome at C6 level of cervical spinal cord (HCC) 11/29/2017   Allergy to alpha-gal 11/25/2016   SCC (squamous cell carcinoma) 04/08/2014    ONSET DATE: 08/15/2021 (fall with B hip fx); Initial injury was in 2019- quadraplegia due to central cord syndrome of C6 secondary to neck fracture.   REFERRING DIAG: Bilateral Hip fx; Left Open- s/p Left IM nail femoral on 08/16/2021; Right closed  THERAPY DIAG:  Muscle weakness (generalized)  Other lack of coordination  Other abnormalities of gait and mobility  Difficulty in walking, not elsewhere classified  Rationale for Evaluation and Treatment Rehabilitation  SUBJECTIVE:   SUBJECTIVE STATEMENT:  Patient reports feeling some better but still not great after missing last PT appt due to low blood pressure.     Pt accompanied by: Paid caregiver and dtr-   PERTINENT HISTORY: Sherry [name] is an 87yoF who experienced a fall at home on 08/15/2021 with hip fracture, s/p Left hip ORIF. PMH: quadriplegia due to central cord syndrome of C6 secondary to neck fracture, HTN, depression with anxiety, neurogenic bladder, bilateral DVT on Xarelto  (s/p of IVC  filter placement). Prior history of significant PT/OT with improvement of function. Pt has full use of shoulder/elbows, limited use of hands, adaptive self feeding sucessful.     PAIN:  None current but does endorse some left thigh pain intermittent  OBJECTIVE:   TODAY'S TREATMENT:   07/20/23    Self care:  BP= 97/63 mmHg prior to treatment BP= 111/69 mmHg after seated therex   Therex:  -Resistive  hip ext 2 x 10 reps each LE -Resistive LAQ 2 x 10 reps each LE -YTB -Resistive hip add- 2x 10 reps -YTB -Resistive  Knee flex - 2x 10 reps -YTB - Manual resistive leg press- 2 x 10 reps  *No symptoms of feeling faint- only very fatigued today.   PATIENT EDUCATION: Education details: Exercise technique Patient verbalized understanding.   HOME EXERCISE PROGRAM: No changes at this time  Access Code: [facility] URL: [email or web address] Date: 11/23/2021 Prepared by: Marina  Moser Exercises - Seated Gluteal Sets - 2 x daily - 7 x weekly - 2 sets - 10 reps - 5 hold - Seated Quad Set - 2 x daily - 7 x weekly - 2 sets - 10 reps - 5 hold - Seated Long Arc Quad - 2 x daily - 7 x weekly - 2 sets - 10 reps - 5 hold - Seated March - 2 x daily - 7 x weekly - 2 sets - 10 reps - 5 hold - Seated Hip Abduction - 2 x daily - 7 x weekly - 2 sets - 10 reps - 5 hold - Seated Shoulder Shrugs - 2 x daily - 7 x weekly - 2 sets - 10 reps - 5 hold - Wheelchair Pressure Relief - 2 x daily - 7 x weekly - 2 sets - 10 reps - 5 hold     GOALS: Goals reviewed with patient? Yes  SHORT TERM GOALS: Target date: 02/22/2022  Pt will be independent with initial UE strengthening HEP in order to improve strength and balance in order to decrease fall risk and improve function at home and work. Baseline: 10/19/2021- patient with no formal UE HEP; 12/02/2021= Patient verbalized knowledge of HEP including use of theraband for UE strengthening.  Goal status: GOAL MET  LONG TERM GOALS: Target date: 09/12/2023   Pt  will be independent with final for UE/LE HEP in order to improve strength and balance in order to decrease fall risk and improve function at home and work. Baseline: Patient is currently BLE NWB and unable to participate in HEP. Has order for UE strengthening. 01/11/2022- Patient now able to participate in LE strengthening although NWB still- good understanding for some basic exercises. Will keep goal active to incorporate progressive LE strengthening exercises. 04/07/2022= Patient still participating in progressive LE seated HEP and has no questions at this time.  Goal status: MET  2.  Pt will improve FOTO to target score of 35  to display perceived improvements in ability to complete ADL's.  Baseline: 10/19/2021= 12; 12/02/2021= 17; 01/11/2022=17; 04/07/2022= 17; 07/05/2022=17; 10/06/2022- outcome measure  not appropriate as patient in non-ambulatory and not consistently standing. Goal status: Goal not appropriate  3.  Pt will increase strength of B UE  by at least 1/2 MMT grade in order to demonstrate improvement in strength and function  Baseline: patient has range of 2-/5 to 4/5 BUE Strength; 12/02/2021= 4/5 except with wrist ext. 01/11/2022= 4/5 B UE strength expept for wrist Goal status: Goal revised-no longer appropriate- working with OT on all UE strengthening.   4. Pt. Will increase strength of RLE by at least 1/2 MMT grade in order to demonstrate improvement in Standing/transfers.  Baseline: 2-/5 Right hip flex/knee ext/flex; 04/07/2022= 2- with right hip flex/knee ext (lacking 28 deg from zero) 4/8= Patient able to ext right knee lacking 18 deg from zero. 07/05/2022=Left knee approx 8 deg from zero and right knee = 23 deg from zero; 12/29/2022 - Left LE = full ROM 4-/5 and right knee ext= 23 deg from zero= 2+/5 (gravity minimized); 03/28/2023= Left knee ext=  4/5 and right knee ext= 3-/5 (able to move lower leg through incomplete ROM -lacking 20 deg from full ROM against gravity); 05/23/2023= right knee ext=  3-/5 (able to move lower leg through incomplete ROM -lacking 17 deg from full ROM against gravity); 06/20/2023= Right knee ext= lacking 20 deg from zero  Goal status: Progressing  5. Pt. Will demo ability to stand pivot transfer with max assist for improved functional mobility and less dependent need on mechanical device.   Baseline: Dependent on hoyer lift for all transfers; 04/07/2022- Unable to test secondary to patient with recent UTI and right hand procedure - will attempt to reassess next visit. ; 04/12/22: Unable to successfully stand due to feet plates on loaned power w/c; 05/10/2022- Patient still having to use rental chair and has not received her original power w/c back to practice SPT. 07/04/2021= Patient just received new power w/c and attempted standing from 1st time today- Patient able to stand with max assist today from w/c and did not attempt to perform pivot transfer- difficulty with static standing and placing weight on right LE. 10/06/2022- Not assessed today- patient needs new AFO prior to attempting transfers; 12/29/2022= Patient has not attempted SPT yet - still trying to improve LE strength enough to stand well without knee buckling with weight shifting. 03/28/2023- not attempted today due to ill fitting brace but patient last performed SPT on 01/10/23 and was able to perform with Max A with feet pre-positioned to angle toward direction of transfer; 05/23/2023- Not tested today but on last visit- patient able to perform SPT with max assist yet concern for twisting knee as she was unable to turn/pivot foot .  06/20/2023- deferred to next session due to patient fatigued with static standing today.  Goal status: ONGOING  6. Pt. Will demonstrate improved functional LE strength as seen by ability to stand > 2 min for improved transfer ability and pregait abilities.   Baseline: not assessed today due to recent dx: UTI; 04/07/2022- Unable to test secondary to patient with recent UTI and right hand procedure  - will attempt to reassess next visit; 04/12/22: Unable to successfully stand due to feet plates on loaned power w/c 05/10/2022- Patient still having to use rental chair and has not received her original power w/c back to practice Standing. 07/05/2022- Patient able to stand with max assist at trunk today for 2 min 3 sec. Will keep goal active to ensure consistency. 10/06/2022= Unable to assess today- patient with some recent blood pressure  issues and needs new AFO. 11/24/2022= Patient demonstrated multiple rounds of standing in // bars- holding up to 1:45 min today but has been able to exhibit 2 min in recent past- working on consistency. 12/29/2022= 2 min 40 sec with patient demonstrating improved overall posture and ability to contract glutes and quads with just min assist at trunk today.   Goal status: MET  7. Pt. Will demonstrate improved functional LE strength as seen by ability to stand > 5 min for improved transfer ability and pregait abilities. Baseline: 12/29/2022= 2 min 40 sec with patient demonstrating improved overall posture and ability to contract glutes and quads with just min assist at trunk today. 03/28/2023= Attempted 2 trials today - at best 2 min 48 sec today but during cert- did stand for 3 min 44 sec. 06/20/2023= Patient able to stand 2x (45 sec and 1 min 12 sec)- max assist yet no slippage of LLE.  07/13/2023= Patient able to stand x 3 trials today- each 1-2 min focusing on posture- patient with improving gluteal activation and less physical assist once in standing position. Goal Status: Ongoing  8. Patient will perform sit to stand transfer with moderate assist consistently > 75% of time for improved transfer ability and less dependence on caregiver.   Baseline: 12/29/2022- Patient currently max assist level assistance with all sit to stand activities. 03/28/2023= Patient is still majority of max assist to stand yet able to pull up from wheelchair smoother in one motion. 06/20/2023- Max Assist  but has only most recently resumed standing since last week so goal still appropriate for new cert; 3/88/7974=dupoo max assist but less overall assist on last trial of standing with improved forward lean and gluteal activation.   Goal status: ONGOING   ASSESSMENT:  CLINICAL IMPRESSION:   Treatment was limited again today due to low blood pressure and patient not feeling great. She did respond positively to some resistive therex- able to complete in partial ROM and without complaint of feeling any worse. Her BP did rise with activity but did not attempt any transfer or standing today. She was pleased with ability to work against resistance today and although fatigue able to work hard and challenged with banded resistance today.  Pt will continue to benefit from skilled PT services to optimize independence and reduced caregiver support.   OBJECTIVE IMPAIRMENTS Abnormal gait, decreased activity tolerance, decreased balance, decreased coordination, decreased endurance, decreased mobility, difficulty walking, decreased ROM, decreased strength, hypomobility, impaired flexibility, impaired UE functional use, postural dysfunction, and pain.   ACTIVITY LIMITATIONS carrying, lifting, bending, sitting, standing, squatting, sleeping, stairs, transfers, bed mobility, continence, bathing, toileting, dressing, self feeding, reach over head, hygiene/grooming, and caring for others  PARTICIPATION LIMITATIONS: meal prep, cleaning, laundry, medication management, personal finances, interpersonal relationship, driving, shopping, community activity, and yard work  PERSONAL FACTORS Age, Time since onset of injury/illness/exacerbation, and 1-2 comorbidities: HTN, cervical Sx are also affecting patient's functional outcome.   REHAB POTENTIAL: Good  CLINICAL DECISION MAKING: Evolving/moderate complexity  EVALUATION COMPLEXITY: Moderate  PLAN: PT FREQUENCY: 1-2x/week  PT DURATION: 12 weeks  PLANNED INTERVENTIONS:  Therapeutic exercises, Therapeutic activity, Neuromuscular re-education, Balance training, Gait training, Patient/Family education, Self Care, Joint mobilization, DME instructions, Dry Needling, Electrical stimulation, Wheelchair mobility training, Spinal mobilization, Cryotherapy, Moist heat, and Manual therapy  PLAN FOR NEXT SESSION:   Continue with LE and core strength training  Sit to stand and static standing trials to promote improved standing. SPT as able.     8:19 AM, 08/16/23  8:19 AM, 08/16/23 Chyrl London, PT Physical Therapist - [facility]  Outpatient Physical Therapy- Main Campus [phone]

## 2023-08-17 ENCOUNTER — Ambulatory Visit: Payer: Medicare HMO | Admitting: Occupational Therapy

## 2023-08-17 ENCOUNTER — Ambulatory Visit: Payer: Medicare HMO

## 2023-08-17 ENCOUNTER — Encounter: Payer: Self-pay | Admitting: Student

## 2023-08-17 DIAGNOSIS — M6281 Muscle weakness (generalized): Secondary | ICD-10-CM

## 2023-08-17 NOTE — Therapy (Signed)
 [facility] Outpatient Rehabilitation at [facility] [address] [place], [place], [address] Phone: [phone]   Fax:  [phone]  Patient Details  Name: Sherry [name] MRN: [id number] Date of Birth: 01/16/1935 Referring Provider:  Abdul Fine, MD  Encounter Date: 08/17/2023  Pt. arrived for her OT appointment this morning, however reported feeling dizzy, having visual changes, and a dry mouth. BP: 84/53  HR: 72 bpms. Pt. was assisted to a reclining position. BP: 84/45, HR: 67 bpms. Pt. declined to go to the ER for further evaluation. Pt. reported wanting to go back to [facility] to be assessed there as her physician was going to be present there today. Pt.'s caregiver gave her Gatorlyte to drink.  BP: 93/59 HR: 69 bpms. Pt. left with her personal caregiver.  Richardson Otter, MS, OTR/L 08/17/2023, 5:56 PM  [facility] Outpatient Rehabilitation at [facility] [address] [place], [place], [address] Phone: [phone]   Fax:  [phone]

## 2023-08-17 NOTE — Telephone Encounter (Signed)
 Would you prefer that a video visit is scheduled ?

## 2023-08-18 LAB — COMPREHENSIVE METABOLIC PANEL WITH GFR
Calcium: 9.3 (ref 8.7–10.7)
eGFR: 91

## 2023-08-18 LAB — BASIC METABOLIC PANEL WITH GFR
BUN: 10 (ref 4–21)
CO2: 32 — AB (ref 13–22)
Chloride: 101 (ref 99–108)
Creatinine: 0.5 (ref 0.5–1.1)
Glucose: 80
Potassium: 5 meq/L (ref 3.5–5.1)
Sodium: 139 (ref 137–147)

## 2023-08-21 ENCOUNTER — Encounter: Payer: Self-pay | Admitting: Student

## 2023-08-22 ENCOUNTER — Ambulatory Visit: Payer: Medicare HMO

## 2023-08-22 ENCOUNTER — Ambulatory Visit: Payer: Medicare HMO | Admitting: Occupational Therapy

## 2023-08-22 DIAGNOSIS — S72001A Fracture of unspecified part of neck of right femur, initial encounter for closed fracture: Secondary | ICD-10-CM

## 2023-08-22 DIAGNOSIS — R2689 Other abnormalities of gait and mobility: Secondary | ICD-10-CM

## 2023-08-22 DIAGNOSIS — M6281 Muscle weakness (generalized): Secondary | ICD-10-CM | POA: Diagnosis not present

## 2023-08-22 DIAGNOSIS — R278 Other lack of coordination: Secondary | ICD-10-CM

## 2023-08-22 DIAGNOSIS — R296 Repeated falls: Secondary | ICD-10-CM

## 2023-08-22 DIAGNOSIS — R2681 Unsteadiness on feet: Secondary | ICD-10-CM

## 2023-08-22 DIAGNOSIS — S14129S Central cord syndrome at unspecified level of cervical spinal cord, sequela: Secondary | ICD-10-CM

## 2023-08-22 DIAGNOSIS — R262 Difficulty in walking, not elsewhere classified: Secondary | ICD-10-CM

## 2023-08-22 NOTE — Therapy (Signed)
 OUTPATIENT PHYSICAL THERAPY NEURO TREATMENT  Patient Name: Sherry [name] MRN: [id number] DOB:1934/09/27, 89 y.o., female Today's Date: 08/22/2023   PCP: Richerd Brigham, MD REFERRING PROVIDER: Cleotilde Barrio   PT End of Session - 08/22/23 1318     Visit Number 124    Number of Visits 139    Date for PT Re-Evaluation 09/12/23    Progress Note Due on Visit 130    PT Start Time 1145    PT Stop Time 1228    PT Time Calculation (min) 43 min    Equipment Utilized During Treatment Gait belt    Activity Tolerance Patient tolerated treatment well;No increased pain    Behavior During Therapy WFL for tasks assessed/performed                         Past Medical History:  Diagnosis Date   Acute blood loss anemia    Arthritis    Cancer (HCC)    skin   Central cord syndrome at C6 level of cervical spinal cord (HCC) 11/29/2017   Hypertension    Protein-calorie malnutrition, severe (HCC) 01/24/2018   S/P insertion of IVC (inferior vena caval) filter 01/24/2018   Tetraparesis (HCC)    Past Surgical History:  Procedure Laterality Date   ANTERIOR CERVICAL DECOMP/DISCECTOMY FUSION N/A 11/29/2017   Procedure: Cervical five-six, six-seven Anterior Cervical Decompression Fusion;  Surgeon: Cheryle Debby DELENA, MD;  Location: [facility] OR;  Service: Neurosurgery;  Laterality: N/A;  Cervical five-six, six-seven Anterior Cervical Decompression Fusion   CATARACT EXTRACTION     FEMUR IM NAIL Left 08/16/2021   Procedure: INTRAMEDULLARY (IM) NAIL FEMORAL;  Surgeon: Cleotilde Barrio, MD;  Location: [facility] ORS;  Service: Orthopedics;  Laterality: Left;   IR IVC FILTER PLMT / S&I /IMG GUID/MOD SED  12/08/2017   Patient Active Problem List   Diagnosis Date Noted   Age-related osteoporosis without current pathological fracture 07/27/2022   Mild recurrent major depression (HCC) 07/27/2022   Hypertensive heart disease with other congestive heart failure (HCC) 02/14/2022   Chronic indwelling  Foley catheter 02/14/2022   Closed hip fracture (HCC) 08/15/2021   DVT (deep venous thrombosis) (HCC) 08/15/2021   Quadriplegia (HCC) 08/15/2021   Fall 08/15/2021   Constipation due to slow transit 08/31/2018   Trauma 06/05/2018   Neuropathic pain 06/05/2018   Neurogenic bowel 06/05/2018   Vaginal yeast infection 01/30/2018   Healthcare-associated pneumonia 01/25/2018   Chronic allergic rhinitis 01/24/2018   Depression with anxiety 01/24/2018   UTI due to Klebsiella species 01/24/2018   Protein-calorie malnutrition, severe (HCC) 01/24/2018   S/P insertion of IVC (inferior vena caval) filter 01/24/2018   Tetraparesis (HCC) 01/20/2018   Neurogenic bladder 01/20/2018   Reactive depression    Benign essential HTN    Acute postoperative anemia due to expected blood loss    Central cord syndrome at C6 level of cervical spinal cord (HCC) 11/29/2017   Allergy to alpha-gal 11/25/2016   SCC (squamous cell carcinoma) 04/08/2014    ONSET DATE: 08/15/2021 (fall with B hip fx); Initial injury was in 2019- quadraplegia due to central cord syndrome of C6 secondary to neck fracture.   REFERRING DIAG: Bilateral Hip fx; Left Open- s/p Left IM nail femoral on 08/16/2021; Right closed  THERAPY DIAG:  Muscle weakness (generalized)  Other lack of coordination  Other abnormalities of gait and mobility  Difficulty in walking, not elsewhere classified  Unsteadiness on feet  Closed fracture of both hips, initial encounter (HCC)  Repeated falls  Central cord syndrome, sequela (HCC)  Rationale for Evaluation and Treatment Rehabilitation  SUBJECTIVE:   SUBJECTIVE STATEMENT:   Patient reports no blood pressure issues today and states feeling okay- no pain.      Pt accompanied by: Paid caregiver and dtr-   PERTINENT HISTORY: Sherry [name] is an 87yoF who experienced a fall at home on 08/15/2021 with hip fracture, s/p Left hip ORIF. PMH: quadriplegia due to central cord syndrome of C6  secondary to neck fracture, HTN, depression with anxiety, neurogenic bladder, bilateral DVT on Xarelto  (s/p of IVC filter placement). Prior history of significant PT/OT with improvement of function. Pt has full use of shoulder/elbows, limited use of hands, adaptive self feeding sucessful.     PAIN:  None current but does endorse some left thigh pain intermittent  OBJECTIVE:   TODAY'S TREATMENT:   08/22/23    TA:  SPT from power w/c to EOM with Max assist from PT (assisted to position ea LE- Left LE extensor tone-so PT positioned foot in front of patients foot to maintain 90/90 position)   Static sit at EOM- focusing on erect sitting posture Dynamic lateral weight shifting- leaning onto ea forearm 2 x 10  Dynamic reaching with ea UE to various positions- Overhead, shoulder, or below hip level- x several min A/P weightshifting at EOM 2 x 10 reps  Therex:  Passive quad stretching in seated- static hold x 2 min x 2 Active ham curl  2 x 10 reps each LE Active LAQ 2 x 15 reps each LE Manually Resistive hip add- 2x 10 reps   PATIENT EDUCATION: Education details: Exercise technique Patient verbalized understanding.   HOME EXERCISE PROGRAM: No changes at this time  Access Code: [facility] URL: [email or web address] Date: 11/23/2021 Prepared by: Marina  Moser Exercises - Seated Gluteal Sets - 2 x daily - 7 x weekly - 2 sets - 10 reps - 5 hold - Seated Quad Set - 2 x daily - 7 x weekly - 2 sets - 10 reps - 5 hold - Seated Long Arc Quad - 2 x daily - 7 x weekly - 2 sets - 10 reps - 5 hold - Seated March - 2 x daily - 7 x weekly - 2 sets - 10 reps - 5 hold - Seated Hip Abduction - 2 x daily - 7 x weekly - 2 sets - 10 reps - 5 hold - Seated Shoulder Shrugs - 2 x daily - 7 x weekly - 2 sets - 10 reps - 5 hold - Wheelchair Pressure Relief - 2 x daily - 7 x weekly - 2 sets - 10 reps - 5 hold     GOALS: Goals reviewed with patient? Yes  SHORT TERM GOALS: Target date: 02/22/2022  Pt  will be independent with initial UE strengthening HEP in order to improve strength and balance in order to decrease fall risk and improve function at home and work. Baseline: 10/19/2021- patient with no formal UE HEP; 12/02/2021= Patient verbalized knowledge of HEP including use of theraband for UE strengthening.  Goal status: GOAL MET  LONG TERM GOALS: Target date: 09/12/2023   Pt will be independent with final for UE/LE HEP in order to improve strength and balance in order to decrease fall risk and improve function at home and work. Baseline: Patient is currently BLE NWB and unable to participate in HEP. Has order for UE strengthening. 01/11/2022- Patient now able to participate in LE strengthening although NWB still- good understanding for some  basic exercises. Will keep goal active to incorporate progressive LE strengthening exercises. 04/07/2022= Patient still participating in progressive LE seated HEP and has no questions at this time.  Goal status: MET  2.  Pt will improve FOTO to target score of 35  to display perceived improvements in ability to complete ADL's.  Baseline: 10/19/2021= 12; 12/02/2021= 17; 01/11/2022=17; 04/07/2022= 17; 07/05/2022=17; 10/06/2022- outcome measure not appropriate as patient in non-ambulatory and not consistently standing. Goal status: Goal not appropriate  3.  Pt will increase strength of B UE  by at least 1/2 MMT grade in order to demonstrate improvement in strength and function  Baseline: patient has range of 2-/5 to 4/5 BUE Strength; 12/02/2021= 4/5 except with wrist ext. 01/11/2022= 4/5 B UE strength expept for wrist Goal status: Goal revised-no longer appropriate- working with OT on all UE strengthening.   4. Pt. Will increase strength of RLE by at least 1/2 MMT grade in order to demonstrate improvement in Standing/transfers.  Baseline: 2-/5 Right hip flex/knee ext/flex; 04/07/2022= 2- with right hip flex/knee ext (lacking 28 deg from zero) 4/8= Patient able to ext right  knee lacking 18 deg from zero. 07/05/2022=Left knee approx 8 deg from zero and right knee = 23 deg from zero; 12/29/2022 - Left LE = full ROM 4-/5 and right knee ext= 23 deg from zero= 2+/5 (gravity minimized); 03/28/2023= Left knee ext=  4/5 and right knee ext= 3-/5 (able to move lower leg through incomplete ROM -lacking 20 deg from full ROM against gravity); 05/23/2023= right knee ext= 3-/5 (able to move lower leg through incomplete ROM -lacking 17 deg from full ROM against gravity); 06/20/2023= Right knee ext= lacking 20 deg from zero  Goal status: Progressing  5. Pt. Will demo ability to stand pivot transfer with max assist for improved functional mobility and less dependent need on mechanical device.   Baseline: Dependent on hoyer lift for all transfers; 04/07/2022- Unable to test secondary to patient with recent UTI and right hand procedure - will attempt to reassess next visit. ; 04/12/22: Unable to successfully stand due to feet plates on loaned power w/c; 05/10/2022- Patient still having to use rental chair and has not received her original power w/c back to practice SPT. 07/04/2021= Patient just received new power w/c and attempted standing from 1st time today- Patient able to stand with max assist today from w/c and did not attempt to perform pivot transfer- difficulty with static standing and placing weight on right LE. 10/06/2022- Not assessed today- patient needs new AFO prior to attempting transfers; 12/29/2022= Patient has not attempted SPT yet - still trying to improve LE strength enough to stand well without knee buckling with weight shifting. 03/28/2023- not attempted today due to ill fitting brace but patient last performed SPT on 01/10/23 and was able to perform with Max A with feet pre-positioned to angle toward direction of transfer; 05/23/2023- Not tested today but on last visit- patient able to perform SPT with max assist yet concern for twisting knee as she was unable to turn/pivot foot .  06/20/2023-  deferred to next session due to patient fatigued with static standing today.  Goal status: ONGOING  6. Pt. Will demonstrate improved functional LE strength as seen by ability to stand > 2 min for improved transfer ability and pregait abilities.   Baseline: not assessed today due to recent dx: UTI; 04/07/2022- Unable to test secondary to patient with recent UTI and right hand procedure - will attempt to reassess next visit;  04/12/22: Unable to successfully stand due to feet plates on loaned power w/c 05/10/2022- Patient still having to use rental chair and has not received her original power w/c back to practice Standing. 07/05/2022- Patient able to stand with max assist at trunk today for 2 min 3 sec. Will keep goal active to ensure consistency. 10/06/2022= Unable to assess today- patient with some recent blood pressure issues and needs new AFO. 11/24/2022= Patient demonstrated multiple rounds of standing in // bars- holding up to 1:45 min today but has been able to exhibit 2 min in recent past- working on consistency. 12/29/2022= 2 min 40 sec with patient demonstrating improved overall posture and ability to contract glutes and quads with just min assist at trunk today.   Goal status: MET  7. Pt. Will demonstrate improved functional LE strength as seen by ability to stand > 5 min for improved transfer ability and pregait abilities. Baseline: 12/29/2022= 2 min 40 sec with patient demonstrating improved overall posture and ability to contract glutes and quads with just min assist at trunk today. 03/28/2023= Attempted 2 trials today - at best 2 min 48 sec today but during cert- did stand for 3 min 44 sec. 06/20/2023= Patient able to stand 2x (45 sec and 1 min 12 sec)- max assist yet no slippage of LLE.  07/13/2023= Patient able to stand x 3 trials today- each 1-2 min focusing on posture- patient with improving gluteal activation and less physical assist once in standing position. Goal Status: Ongoing  8. Patient will  perform sit to stand transfer with moderate assist consistently > 75% of time for improved transfer ability and less dependence on caregiver.   Baseline: 12/29/2022- Patient currently max assist level assistance with all sit to stand activities. 03/28/2023= Patient is still majority of max assist to stand yet able to pull up from wheelchair smoother in one motion. 06/20/2023- Max Assist but has only most recently resumed standing since last week so goal still appropriate for new cert; 3/88/7974=dupoo max assist but less overall assist on last trial of standing with improved forward lean and gluteal activation.   Goal status: ONGOING   ASSESSMENT:  CLINICAL IMPRESSION:   Patient performed well overall- challenged with dynamic trunk activities- able to recover well with only fatigue as limiting factor. Patient was able to hold quad stretch x several min overall without report of any pain. Pt will continue to benefit from skilled PT services to optimize independence and reduced caregiver support.   OBJECTIVE IMPAIRMENTS Abnormal gait, decreased activity tolerance, decreased balance, decreased coordination, decreased endurance, decreased mobility, difficulty walking, decreased ROM, decreased strength, hypomobility, impaired flexibility, impaired UE functional use, postural dysfunction, and pain.   ACTIVITY LIMITATIONS carrying, lifting, bending, sitting, standing, squatting, sleeping, stairs, transfers, bed mobility, continence, bathing, toileting, dressing, self feeding, reach over head, hygiene/grooming, and caring for others  PARTICIPATION LIMITATIONS: meal prep, cleaning, laundry, medication management, personal finances, interpersonal relationship, driving, shopping, community activity, and yard work  PERSONAL FACTORS Age, Time since onset of injury/illness/exacerbation, and 1-2 comorbidities: HTN, cervical Sx are also affecting patient's functional outcome.   REHAB POTENTIAL: Good  CLINICAL  DECISION MAKING: Evolving/moderate complexity  EVALUATION COMPLEXITY: Moderate  PLAN: PT FREQUENCY: 1-2x/week  PT DURATION: 12 weeks  PLANNED INTERVENTIONS: Therapeutic exercises, Therapeutic activity, Neuromuscular re-education, Balance training, Gait training, Patient/Family education, Self Care, Joint mobilization, DME instructions, Dry Needling, Electrical stimulation, Wheelchair mobility training, Spinal mobilization, Cryotherapy, Moist heat, and Manual therapy  PLAN FOR NEXT SESSION:   Continue with  LE and core strength training  Sit to stand and static standing trials to promote improved standing. SPT as able.     1:28 PM, 08/22/23  1:28 PM, 08/22/23 Chyrl London, PT Physical Therapist - [place] [facility]  Outpatient Physical Therapy- Main Campus [phone]

## 2023-08-22 NOTE — Therapy (Addendum)
 Occupational Therapy Treatment Note  Patient Name: Sherry [name] MRN: [id number] DOB:08/29/1934, 89 y.o., female Today's Date: 11/29/2022  PCP:  [facility] PROVIDER: Abdul Fine, MD  END OF SESSION:   OT End of Session - 08/22/23 1702     Visit Number 134    Number of Visits 192    Date for OT Re-Evaluation 10/05/23    OT Start Time 1100    OT Stop Time 1145    OT Time Calculation (min) 45 min    Activity Tolerance Patient tolerated treatment well    Behavior During Therapy [facility] for tasks assessed/performed                        Past Medical History:  Diagnosis Date   Acute blood loss anemia    Arthritis    Cancer (HCC)    skin   Central cord syndrome at C6 level of cervical spinal cord (HCC) 11/29/2017   Hypertension    Protein-calorie malnutrition, severe (HCC) 01/24/2018   S/P insertion of IVC (inferior vena caval) filter 01/24/2018   Tetraparesis (HCC)    Past Surgical History:  Procedure Laterality Date   ANTERIOR CERVICAL DECOMP/DISCECTOMY FUSION N/A 11/29/2017   Procedure: Cervical five-six, six-seven Anterior Cervical Decompression Fusion;  Surgeon: Cheryle Debby DELENA, MD;  Location: [facility] OR;  Service: Neurosurgery;  Laterality: N/A;  Cervical five-six, six-seven Anterior Cervical Decompression Fusion   CATARACT EXTRACTION     FEMUR IM NAIL Left 08/16/2021   Procedure: INTRAMEDULLARY (IM) NAIL FEMORAL;  Surgeon: Cleotilde Barrio, MD;  Location: [facility] ORS;  Service: Orthopedics;  Laterality: Left;   IR IVC FILTER PLMT / S&I /IMG GUID/MOD SED  12/08/2017   Patient Active Problem List   Diagnosis Date Noted   Age-related osteoporosis without current pathological fracture 07/27/2022   Mild recurrent major depression (HCC) 07/27/2022   Hypertensive heart disease with other congestive heart failure (HCC) 02/14/2022   Chronic indwelling Foley catheter 02/14/2022   Closed hip fracture (HCC) 08/15/2021   DVT (deep venous thrombosis) (HCC) 08/15/2021    Quadriplegia (HCC) 08/15/2021   Fall 08/15/2021   Constipation due to slow transit 08/31/2018   Trauma 06/05/2018   Neuropathic pain 06/05/2018   Neurogenic bowel 06/05/2018   Vaginal yeast infection 01/30/2018   Healthcare-associated pneumonia 01/25/2018   Chronic allergic rhinitis 01/24/2018   Depression with anxiety 01/24/2018   UTI due to Klebsiella species 01/24/2018   Protein-calorie malnutrition, severe (HCC) 01/24/2018   S/P insertion of IVC (inferior vena caval) filter 01/24/2018   Tetraparesis (HCC) 01/20/2018   Neurogenic bladder 01/20/2018   Reactive depression    Benign essential HTN    Acute postoperative anemia due to expected blood loss    Central cord syndrome at C6 level of cervical spinal cord (HCC) 11/29/2017   Allergy to alpha-gal 11/25/2016   SCC (squamous cell carcinoma) 04/08/2014   ONSET DATE: 11/29/2017  REFERRING DIAG: Central Cord Syndrome at C6 of the Cervical Spinal Cord, Fall with Bilateral Closed Hip Fractures, with ORIF repair with Intramedullary Nailing of the Left Hip Fracture.   THERAPY DIAG:  Muscle weakness (generalized)  Other lack of coordination  Rationale for Evaluation and Treatment: Rehabilitation  SUBJECTIVE:  SUBJECTIVE STATEMENT:  Pt. Reports that the 24 reacher is feels lighter and easier to use rather than the 36 reacher while picking up items from floor level.   Pt accompanied by: self, Personal Care aide  PERTINENT HISTORY: Central Cord Syndrome at C6 of the Cervical  Spinal Cord, Fall with Bilateral Closed Hip Fractures, with ORIF repair with Intramedullary Nailing of the Left Hip Fracture.   PRECAUTIONS: None  WEIGHT BEARING RESTRICTIONS: No  PAIN:  Are you having pain? No pain   LIVING ENVIRONMENT: Lives with: [facility] Stairs: No Has following equipment at home: Wheelchair (power)  PLOF: Independent   PATIENT GOALS:  See below for established goals  OBJECTIVE:  Note: Objective  measures were completed at Evaluation unless otherwise noted.  HAND DOMINANCE: Right  ADLs: Caregiver assist with ADLs, and [facility]  MOBILITY STATUS: Uses a power w/c  ACTIVITY TOLERANCE: Activity tolerance: Fair  FUNCTIONAL OUTCOME MEASURES:  Measurements:   12/09/2021:   Shoulder flexion: R: 120(136), L: 138(150) Shoulder abduction: R: 108(120), L: 110(120) Elbow: R: 0-148, L: 0-146 Wrist flexion: R: 55(62), L: 78 Writ extension: R: 34(55), L: 3(4) RD: R: 12(20), L: 16(26) UD: R: 8(24), L: 6(18) Thumb radial abduction: R: 11(20), L: 8(20) Digit flexion to the [facility]: R:  2nd: 5cm(3.5cm), 3rd: 7.5 cm(5cm), 4th: 7cm(3cm), 5th: 6cm(5.5cm) L: 2nd: 8cm(8cm), 3rd: 8cm(8cm), 4th: 7.5cm(7cm), 5th: 7cm(7cm)   02/03/2022:   Shoulder flexion: R: 122(138), L: 148(154) Shoulder abduction: R: 109(125), L: 125(125 Elbow: R: 0-148, L: 0-146 Wrist flexion: R: 60(68), L: 79 Writ extension: R: 40(55), L: 3(10) RD: R: 14(24), L: 20(28) UD: R: 8(24), L: 6(18) Thumb radial abduction: R: 20(25), L: 8(20) Digit flexion to the [facility]: R:  2nd: 4.5cm(3cm), 3rd: 6.5cm(4.5cm), 4th: 6.5 cm(3cm), 5th: 4.5cm(5cm) L: 2nd: 8cm(8cm), 3rd: 8cm(7cm), 4th: 7.5cm(7cm), 5th: 6.5cm(6.5cm)   04/07/2022:   Shoulder flexion: R: 125(150), L: 160(160) Shoulder abduction: R: 109(125), L: 125(125 Elbow: R: 0-148, L: 0-146 Wrist flexion: R: 60(68), L: 79 Writ extension: R: 40(55), L: 5(18) RD: R: 14(24), L: 20(28) UD: R: 18(24), L: 8(18) Thumb radial abduction: R: 20(25), L: 8(20) Digit flexion to the [facility]: R:  2nd: 4.5 cm(4 cm), 3rd: 6.5cm(3cm), 4th: 7 cm (5cm), 5th: 5cm(5cm) L: 2nd: 8cm(8cm), 3rd: 8cm(7cm), 4th: 7.5cm(7cm), 5th: 7cm(7cm)   07/05/2022:   Shoulder flexion: R: 125(154), L: 160(160) Shoulder abduction: R: 95(130), L: 143(143) Elbow: R: 0-148, L: 0-146 Wrist flexion: R: 60(68), L: 79 Writ extension: R: 40(60), L: 0(12) RD: R: 10(24), L: 12(20) UD: R: 16(24), L: 8(16) Thumb radial  abduction: R: 20(25), L: 8(20) Digit flexion to the [facility]: R:  2nd: 4 cm(4cm), 3rd: 6.5cm( 3.5cm), 4th: 7 cm (5cm), 5th: 6cm(5cm) L: 2nd: 8cm(8cm), 3rd: 8cm(7cm), 4th: 8cm(7cm), 5th: 7cm(7cm)   09/13/2022:   Shoulder flexion: R: 125(154), L: 160(160) Shoulder abduction: R: 100(130), L: 143(143) Elbow: R: 0-150, L: 0-150 Wrist flexion: R: 55(68), L: 79 Writ extension: R: 45(60), L: 0(12) RD: R: 10(24), L: 12(20) UD: R: 16(24), L: 8(16) Thumb radial abduction: R: 20(25), L: 8(20) Digit flexion to the [facility]: R:  2nd: 4 cm(4cm), 3rd: 6cm( 5cm), 4th: 6 cm (4cm), 5th: 5cm(5cm) L: 2nd: 7cm(cm), 3rd: 7cm(6cm), 4th: 7cm(6cm), 5th: 6cm(6cm)   10/27/2022   Shoulder flexion: R: 128(154), L: 160(160) Shoulder abduction: R: 105(130), L: 143(143) Elbow: R: 0-150, L: 0-150 Wrist flexion: R: 55(68), L: 79 Writ extension: R: 46(64), L: 0(20) RD: R: 10(20), L: 18(24) UD: R: 18(28), L: 8(20) Thumb radial abduction: R: 30(42), L:20(24) Digit flexion to the [facility]: R:  2nd: 4 cm(3cm), 3rd: 6cm( 6cm), 4th: 6 cm (5cm), 5th: 5cm(5cm) L: 2nd: 8cm(8cm), 3rd: 8cm(6cm), 4th: 8cm(7cm), 5th: 6cm(6cm)  01/19/2023  Shoulder flexion: R: 131(154), L: 160(160) Shoulder abduction: R: 110(130), L: 143(143)  Elbow: R: 0-150, L: 0-150 Wrist flexion: R: 55(68), L: 79 Writ extension: R: 46(64), L: 0(20) RD: R: 10(20), L: 18(24) UD: R: 18(28), L: 14(20) Thumb radial abduction: R: 38(48), L:24(26) Thumb IP flexion: R: 35(50) L: 20(45) Digit flexion to the [facility]: R:  2nd: 3.5 cm(3cm), 3rd: 6cm( 6cm), 4th: 6 cm (5cm), 5th: 5cm(4.5cm) L: 2nd: 8cm(7.5cm), 3rd: 8cm(6cm), 4th: 7.5cm(6cm), 5th: 6cm(4.5cm)  04/04/2023  Shoulder flexion: R: 134(154), L: 160(160) Shoulder abduction: R: 114(130), L: 143(143) Elbow: R: WNL, L: WNL Wrist flexion: R: 55(68), L: 80 Writ extension: R: 46(64), L: -10(10) RD: R: 14(24), L: 20(24) UD: R: 20(30), L: 14(20) Thumb radial abduction: R: 38(48), L:28(30) Thumb IP flexion: R: 40(50) L:  25(45) Digit flexion to the [facility]: R:  2nd: 3.5 cm(3cm), 3rd: 6cm( 5cm), 4th: 6 cm (4cm), 5th: 5cm(5cm) L: 2nd: 8cm(7.5cm), 3rd: 8cm(7cm), 4th: 7.5cm(7cm), 5th: 7cm(6cm)  05/25/2023  Shoulder flexion: R: 134(154), L: 160(160) Shoulder abduction: R: 117(134), L: 143(143) Elbow: R: WNL, L: WNL Wrist flexion: R: 55(68), L: 80 Wrist extension: R: 46(64), L: -8(14) RD: R: 14(24), L: 20(24) UD: R: 20(32), L: 14(22) Thumb radial abduction: R: 38(50), L:28(30) Thumb IP flexion: R: 40(50) L: 30(45) Digit flexion to the [facility]: R:  2nd: 3.5 cm(3cm), 3rd: 6cm( 5cm), 4th: 7 cm (4cm), 5th: 5cm(4cm) L: 2nd: 8cm(7.5cm), 3rd: 8cm(7cm), 4th: 7.5cm(7cm), 5th: 7cm(6cm)  07/13/23  Shoulder flexion: R: 134(154), L: 160(160) Shoulder abduction: R: 122(134), L: 143(143) Elbow: R: WNL, L: WNL Wrist flexion: R: 55(68), L: 80 Wrist extension: R: 50(64), L: -4(18) RD: R: 18(24), L: 24(24) UD: R: 20(32), L: 14(22) Thumb radial abduction: R: 38(50), L:3230) Thumb IP flexion: R: 42(50) L: 30(45) Digit flexion to the [facility]: R:  2nd: 3.5 cm(3cm), 3rd: 6cm( 5cm), 4th: 7 cm (4cm), 5th: 5cm(4cm) L: 2nd: 8cm(7.5cm), 3rd: 8cm(7cm), 4th: 7.5cm(7cm), 5th: 7cm(6cm)   COORDINATION:   01/19/2023:  9 hole peg test:  Right: 2 pegs placed in 1 min. & 5 sec.  Pt. was able to remove 9 pegs in 17 sec.  Left: 2 pegs placed in 1 min. & 10 sec. Pt. was able to remove 9 pegs in 20 sec.  04/04/23:  Pt. was able to consistently grasp the pegs however each one slipped out of her fingers when attempting to place them into the Pegboard.  05/25/2023:   Right: 9 pegs placed and removed in 2 min. & 41 sec. Left: Unable to grasp the pegs for a horizontal position.  07/13/23    Right: 9 pegs placed and removed in 2 min. & 11 sec.   SENSATION: Intact  EDEMA: N/A   COGNITION: Overall cognitive status: WNL   VISION: Subjective report: Wears glasses, No changes in vision.  PERCEPTION: Intact  TODAY'S TREATMENT:                                                                                                                               DATE:  08/22/2023  Therapeutic Ex.    -Pt. tolerated PROM bilateral wrist extension, PROM for bilateral digit MP, PIP, and DIP flexion, and extension, thumb abduction following moist heat modality for 8 min.   Self-Care:   -Pt education was provided about reacher use for retrieve items from the floor. -Pt. returned demonstration of proper use of 24 reacher to pick up objects of progressively decreasing in size from floor level, and discarding objects onto  a tabletop surface. The objects were objects were chosen to simulate items the Pt. Drops on the floor at home. -To further challenge the task, Pt. worked on picking up smooth, slick bottle tops from floor level.        PATIENT EDUCATION: Education details: Bilateral hand and shoulder ROM, and reacher use Person educated: Patient Education method: Explanation, Demonstration, and Verbal cues Education comprehension: verbalized understanding and returned demonstration  HOME EXERCISE PROGRAM: Continue to assess HEP needs, and provide as needed.   GOALS: Goals reviewed with patient? Yes  LONG TERM GOALS: Target date: 10/05/2023  3.  Patient will independently use a reacher to pick up items from various surfaces.  Baseline: 07/13/2023: Pt. Is starting to use a reacher to retrieve items with an adaptation/modification to the trigger.05/25/2023:  Pt. is now able to initiate grasping a reacher to open and close the reacher with the right hand, however requires the grasp handle to be repositioned  within the hand. Pt. requires work on using/moving the reacher with the right hand through various planes. 04/04/2023: Pt. continues to work on improving Digit flexion to the [facility] to be able to efficiently handle Adaptive equipment. 01/19/2023: Pt. Is improving with Bilateral digit flexion to the [facility]. Pt. has difficulty firmly holding adaptive  devices. 12/06/2022: Pt. continues to present with increased MP, PIP, and DIP digit tightness/stiffness limiting the formulation of bilateral composites fists. 10/27/2022: Pt. presents with increased MP, PIP, and DIP digit tightness/stiffness limiting the formulation of bilateral composites fists during this progress reporting period limiting. Digit flexion to the [facility]: R:  2nd: 4 cm(3cm), 3rd: 6cm( 6cm), 4th: 6 cm (5cm), 5th: 5cm(5cm), L: 2nd: 8cm(8cm), 3rd: 8cm(6cm), 4th: 8cm(7cm), 5th: 6cm(6cm) 09/20/2022: Improving digit flexion to the [facility]. 09/13/22: improved digit flexion to the [facility]. 07/05/2022: Pt. presents with digit MP, PIP, and DIP extensor tightness limiting her ability to securely grip objects in her bilateral hands.  04/07/2022: Pt. Has improved with right 2nd, and 3rd digit flexion towards the [facility]. Pt. Continues to have difficulty securely holding and applying deodorant.02/03/2022: Pt. Has improved with digit flexion, however, continues to have difficulty securely holding and using deodorant. 12/09/2021: Bilateral hand/digit MP, PIP, and DIP extension tightness limits her ability achieve digit flexion to hold, and apply deodorant. 10/28/2021:  Pt. continues to have difficulty holding the deodorant. 01/12/2021: Pt. Presents with limited digit extension. Pt. Is able to initiate holding deodorant, however is unable to hold it while using it  Goal status: Ongoing   4.  Pt. Will improve bilateral wrist extension in preparation for anticipating, and initiating reaching for objects at the table.  Baseline: 07/13/23:  Wrist extension: R: 50(64), L: -4(18) 05/25/23: Wrist extension: R: 46(64), L: -8(14) 04/04/23: R: 46(64) L: -10(10)01/19/2023:  R: 46(64) L: 0(20) 12/06/2022: Pt continues to to progress with bilateral wrist extension in preparation fro functional reaching. 10/27/2022: Pt. Is improving with bilateral wrist extension. R: 46(64) L: 0(20) 09/20/2022: limited PROM in the Left wrist extension 2/2  tightness/stiffness. 09/13/22: R: 55(68), L: 0(12) 07/19/2022: Bilateral wrist extension  in limited. 07/05/2022: Right: 40(60) Left: 0(12) 04/07/2022: Right: 40(55) Left: 5(18) 02/03/2022: Right: 34(55) Left: 3(4) 12/09/2021: Right  34(55)  Left  3(4) 10/28/2021:  Right 22(38), Left 0(15) 09/14/2021:  Right: 17(35), left 2(15)  Goal status: Progressing/Ongoing  7.  Pt. Will increase bilateral lateral pinch strength by 2 lbs to be able to securely grasp items during ADLs, and IADL tasks.  Baseline: 01/19/2023: Deferred as the Pinch meter is out for calibration.12/06/2022: Pt. Is able to more securely hold objects during ADLs/IADLs 10/27/2022: Pt. Is improving with holding items with her bilateral thumbs. (Pinch meter out for calibration) 09/20/2022: lateral pinch continues to be limited 09/13/22: R 6.5# L 4# 07/19/2022: TBD 07/05/2022: TBD 07/2022: NT-Pinch meter out for calibration. 02/03/2022: Right: 6#, Left: 4# 12/09/2021: Right: 6#, Left: 4#  Goal status: Deferred   9.  Pt. will complete plant care with modified independence.  Baseline:12/06/2022: Pt. Is independent with plant care in her husband's room, however is not able to access her plants due to furniture blocking access to them. 10/27/2022: Pt. Is able to water plants that are closer, and within reach. Pt. continues to have difficulty reaching for thorough plant care. 09/20/2022: Pt. Continues to have difficulty reaching for thorough plant care. 09/13/22: continues to report intermittent difficulty 07/19/2022: Pt. Continues to be able to water, and care for some of her plants. Pt. Has more difficulty with plants that are harder to reach. 07/05/2022: pt. Is able to water, and care for some of her plants. Pt. Has more difficulty with plants that are harder to reach. 04/07/2022: Pt. is now able to hold a cup and water her plants.02/03/2022: Pt. has difficulty caring for her plants.  Goal status:Achieved   10.  Pt. will demonstrate adaptive techniques to assist with  the efficiency of self-dressing, or morning care tasks.  Baseline: 07/13/23:Pt. continues to work on improving UE functioning to be able to efficiently use Adaptive equipment 05/25/2023: Pt. continues to work on improving UE functioning to be able to efficiently use Adaptive equipment.  04/04/2023:  Pt. continues to work on improving UE functioning to be able to efficiently use Adaptive equipment. 01/19/2203: Pt. Is able to donn her jacket in reverse independently. Pt. requires assist from staff, as Pt. Reports staff have decreased time. 12/06/2022: Pt. requires assist from staff, as Pt. Reports staff have decreased time. 10/27/2022: Pt. is able to assist with initiating UE dressing. Pt. requires assist from personal, and staff care aides. 09/20/2022: Pt. continues to require assist form personal, and staff care aides. 09/13/22: MODA dressing, reports she is often rushed 07/19/2022: Pt. continues to require assist with self-dressing/morning care tasks. 07/05/2022: Continue with goal. 04/07/2022: Pt. Continues to require assist with the efficiency of self-dressing, and morning care tasks.02/03/2022: Pt. requires assist from caregivers 2/2 time limitations during morning care.  Goal status: Ongoing  11.  Pt. will improve BUE strength to improve ADL, and IADL functioning.  Baseline: 07/13/23: shoulder flexion L 4/5, R 4-/5; shoulder abduction L 4+/5, R 4-/5. elbow flexion B 5/5, elbow extension 4+/5 01/19/2023: L 4/5, R 3+/5  05/25/2023: shoulder flexion L 4/5, R 4-/5; shoulder abduction L 4+/5, R 4-/5. elbow flexion B 5/5, elbow extension 4+/5 01/19/2023: L 4/5, R 3+/5  04/04/2023: shoulder flexion L 4/5, R 3+/5; shoulder abduction L 4+/5, R 3+/5. elbow flexion B 5/5, elbow extension 4/5 01/19/2023: L 4/5, R 3+/5 12/06/2022: Continue 10/27/2022: shoulder flexion L 4/5, R 3+/5; shoulder abduction L 4+/5, R 3+/5. elbow flexion B 5/5, elbow extension  4/5 09/20/2022: Pt. Presents with limited BUE strength 09/13/22: shoulder flexion  L 4/5, R 3/5; elbow flexion B 5/5, elbow extension 4-/5; shoulder abduction L 4+/5, R 3+/5. 07/19/2022: BUE strength continues to be limited. 07/05/2022: shoulder flexion: right 4-/5, abduction: 3+/5, elbow flexion: right: 5/5, left 5/5, extension: right: 4-/5, left 4-/5, wrist extension: right: 3-/5, left: 2-/5  Goal status: Ongoing  12.  Pt. will improve bilateral thumb radial abduction in order to be able to hold the grab bars while standing with PT  Baseline: 07/13/23: Pt. Is improving with holding the grab bars with the right hand .05/25/23: Thumb radial abduction: R: 38(50), L:28(30) 04/04/23: R: 38(48), L:28(30) 01/19/2023: R: 38(48) L: 24(26)12/06/2022: Pt. Continues to present  with limited thumb abduction, however is improving holding onto the parallel bars.10/27/2022: thumb radial abduction: R: 30(42), L: 20(24  Goal status: Ongoing  13.  Pt. Will be able to securely hold, and stabilize medication bottles at a tabletop surface when opening, and closing them.  Baseline: 01/19/2023: Pt. Is now able to securely hold medication bottles while opening them, and stabilizes them on surfaces while opening them. 12/06/2022: Pt is improving with securely stabilizing medication bottles. 10/27/2022: Pt. Stabilizes bottles against her torso when attempting to open, and close them   Goal status: Achieved  14: Pt. Will improve bilateral thumb IP flexion to improve active grasping patterns.    Baseline: 07/13/23: Thumb IP flexion: R: 42(50) L: 30(45) 05/25/2023: Thumb IP flexion: R: 40(50) L: 30(45) 04/04/2023:  R: 40(50) L: 25(45) 01/19/2023: Right 35(50), Left: 20/45)    Goal status: Ongoing    15. Pt. Will improve bilateral FMC/speed, and dexterity. As evidence by improved scores on the 9 hole peg test.  Baseline: 07/13/2023: Right: 9 pegs placed and removed in 2 min. & 11 sec. 05/25/2023: 9 Hole pegs test speed/dexterity: Right: 9 pegs placed and removed in 2 min. & 41 sec. Left: Unable to grasp the pegs for a  horizontal position. 04/04/23: Pt. was able to consistently grasp the pegs however each one slipped out of her fingers when attempting to place them into the Pegboard. 01/19/2023: 2 pegs placed in 1 min. & 5 sec.  Pt. was able to remove 9 pegs in 17 sec.; Left: 2 pegs placed in 1 min. & 10 sec. Pt. was able to remove 9 pegs in 20 sec.   Goal status: Ongoing  ASSESSMENT:  CLINICAL IMPRESSION:  Pt. Arrived to treatment session with no spells of dizziness. Pt. BP and HR taken; 125/65, HR: 74 bpms. Pt. Reports that she was placed on a  medication to help her BP. Pt. tolerated PROM in BUE and tolerated use of 24 reacher well, and was receptive to A/E education as well. Pt. was able to pick up various sized/ textured items from floor level using 24 reacher, with the R hand. Pt. required modifications for  to reduce the  reacher trigger threshold, to be able to extend digits far enough to pull trigger  Pt. continues to benefit form OT services to work on impoving ROM, and UE strength in order to work towards increasing bilateral hand grasp on objects, and increasing engagement of bilateral hands during ADLs, and IADL tasks.      PERFORMANCE DE PERFORMANCE DEFICITS: in functional skills including ADLs, IADLs, coordination, dexterity, ROM, strength, and UE functional use, cognitive skills including , and psychosocial skills including coping strategies and environmental adaptation.   IMPAIRMENTS: are limiting patient from ADLs, IADLs, and leisure.   CO-MORBIDITIES: may  have co-morbidities  that affects occupational performance. Patient will benefit from skilled OT to address above impairments and improve overall function.  MODIFICATION OR ASSISTANCE TO COMPLETE EVALUATION: Min-Moderate modification of tasks or assist with assess necessary to complete an evaluation.  OT OCCUPATIONAL PROFILE AND HISTORY: Detailed assessment: Review of records and additional review of physical, cognitive, psychosocial history  related to current functional performance.  CLINICAL DECISION MAKING: Moderate - several treatment options, min-mod task modification necessary  REHAB POTENTIAL: Good for stated goals  PLAN:  OT FREQUENCY 2x's a week  OT DURATION: 12 weeks  PLANNED INTERVENTIONS ADL training, A/E training, UE ther. Ex, Manual therapy, neuromuscular re-education, moist heat modality, Paraffin Bath, Splinting, and  Pt./caregiver education   RECOMMENDED OTHER SERVICES: PT  CONSULTED AND AGREED WITH PLAN OF CARE: Patient  PLAN FOR NEXT SESSION: Continue Treatment as per established POC  Damien Nap, OTS   Richardson Otter, MS, OTR/L   08/22/23, 5:06 PM

## 2023-08-24 ENCOUNTER — Ambulatory Visit: Admitting: Occupational Therapy

## 2023-08-24 ENCOUNTER — Ambulatory Visit

## 2023-08-24 DIAGNOSIS — M6281 Muscle weakness (generalized): Secondary | ICD-10-CM

## 2023-08-24 NOTE — Therapy (Signed)
 [facility] Outpatient Rehabilitation at [facility] [address] [place], [place], [address] Phone: [phone]   Fax:  [phone]  Patient Details  Name: Sherry [name] MRN: [id number] Date of Birth: 1934-10-23 Referring Provider:  Jimmy Charlie FERNS, MD  Encounter Date: 08/24/2023  Upon arrival to the clinic Pt. reported feeling a little off, and that her BP was a little lower when taken this morning at [facility]. Pt. requested to have her BP taken: 89/58 HR 64 bpms. Pt. reclined with BLE's elevated. Pt. caregiver provided her with a Gatorade. BP reassessed: BP 104/60 HR 65 bpms. Pt. reported not feeling well enough to resume therapy, and requested to return to [facility].  Pt. reports that she is planning to request to be evaluated by the physcian.  Richardson Otter, MS, OTR/L 08/24/2023, 11:39 AM  [facility] Outpatient Rehabilitation at [facility] [address] [place], [place], [address] Phone: [phone]   Fax:  [phone]

## 2023-08-25 ENCOUNTER — Non-Acute Institutional Stay (SKILLED_NURSING_FACILITY): Payer: Self-pay | Admitting: Nurse Practitioner

## 2023-08-25 ENCOUNTER — Encounter: Payer: Self-pay | Admitting: Nurse Practitioner

## 2023-08-25 DIAGNOSIS — N319 Neuromuscular dysfunction of bladder, unspecified: Secondary | ICD-10-CM

## 2023-08-25 DIAGNOSIS — I110 Hypertensive heart disease with heart failure: Secondary | ICD-10-CM | POA: Diagnosis not present

## 2023-08-25 DIAGNOSIS — G8250 Quadriplegia, unspecified: Secondary | ICD-10-CM

## 2023-08-25 DIAGNOSIS — I951 Orthostatic hypotension: Secondary | ICD-10-CM | POA: Diagnosis not present

## 2023-08-25 DIAGNOSIS — M810 Age-related osteoporosis without current pathological fracture: Secondary | ICD-10-CM

## 2023-08-25 DIAGNOSIS — I5089 Other heart failure: Secondary | ICD-10-CM

## 2023-08-25 DIAGNOSIS — G894 Chronic pain syndrome: Secondary | ICD-10-CM | POA: Diagnosis not present

## 2023-08-25 DIAGNOSIS — K5901 Slow transit constipation: Secondary | ICD-10-CM

## 2023-08-25 NOTE — Assessment & Plan Note (Signed)
 Partial post cervical spine fracture Continues supportive care.

## 2023-08-25 NOTE — Assessment & Plan Note (Signed)
 Well-controlled  at this time

## 2023-08-25 NOTE — Assessment & Plan Note (Signed)
 Doing well with scheduled tylenol  1 gm TID

## 2023-08-25 NOTE — Progress Notes (Signed)
 Location:  Other [place].  Nursing Home Room Number: [facility] [id number] Place of Service:  SNF [phone]) Harlene An, NP  PCP: Abdul Fine, MD  Patient Care Team: Abdul Fine, MD as PCP - General (Family Medicine)  Extended Emergency Contact Information Primary Emergency Contact: [name],[name] Mobile Phone: [phone] Relation: Daughter Secondary Emergency Contact: Mathias Lynwood HERO Address: [address]          [place], [place] [address] [place] Home Phone: [phone] Mobile Phone: [phone] Relation: Spouse  Goals of care: Advanced Directive information    08/25/2023    9:03 AM  Advanced Directives  Does Patient Have a Medical Advance Directive? Yes  Type of Estate agent of [place];Living will;Out of facility DNR (pink MOST or yellow form)  Does patient want to make changes to medical advance directive? No - Patient declined  Copy of Healthcare Power of Attorney in Chart? Yes - validated most recent copy scanned in chart (See row information)     Chief Complaint  Patient presents with   Medical Management of Chronic Issues    Medical Management of Chronic Issues.     HPI:  Pt is a 89 y.o. female seen today for medical management of chronic disease.   She has been experiencing orthostatic hypotension for a few months, with symptoms worsening in the past two weeks. She feels weak and experiences vision changes when her blood pressure drops. She is currently on Midodrine  10 mg three times a day for hypotension, with an additional dose if needed.  She has a history of high blood pressure and was previously on medication for it, but is currently not on any specific antihypertensive medication except for Aldactone  (spironolactone ) for fluid management. In the past an attempt to discontinue Aldactone  resulted in increased swelling, particularly  in her knees. Her pulse has been noted to be low, around 57 bpm, which is a new finding for her.  She has a history of heart failure diagnosed after a fall that resulted in significant fluid retention, which was managed with diuretics and has not had any exacerbations. There is no recent history of significant heart issues.  She has been on alendronate once a week for bone health following a hip fracture from a fall approximately three years ago. She experiences bloating and fullness, which she associates with constipation, although she denies any significant changes in bowel habits. She uses a catheter, which is flushed twice daily, and reports no issues with its function.  She has a family history of thyroid problems and wants her thyroid function comprehensively evaluated, as she suspects it might be related to her symptoms.   Past Medical History:  Diagnosis Date   Acute blood loss anemia    Arthritis    Cancer (HCC)    skin   Central cord syndrome at C6 level of cervical spinal cord (HCC) 11/29/2017   Hypertension    Protein-calorie malnutrition, severe (HCC) 01/24/2018   S/P insertion of IVC (inferior vena caval) filter 01/24/2018   Tetraparesis (HCC)    Past Surgical History:  Procedure Laterality Date   ANTERIOR CERVICAL DECOMP/DISCECTOMY FUSION N/A 11/29/2017   Procedure: Cervical five-six, six-seven Anterior Cervical Decompression Fusion;  Surgeon: Cheryle Debby LABOR, MD;  Location: [facility] OR;  Service: Neurosurgery;  Laterality: N/A;  Cervical five-six, six-seven Anterior Cervical Decompression Fusion   CATARACT EXTRACTION     FEMUR IM NAIL Left 08/16/2021   Procedure: INTRAMEDULLARY (IM) NAIL FEMORAL;  Surgeon: Cleotilde Barrio, MD;  Location: [facility] ORS;  Service: Orthopedics;  Laterality: Left;   IR IVC FILTER PLMT / S&I /IMG GUID/MOD SED  12/08/2017    Allergies  Allergen Reactions   Other Swelling   Lisinopril-Hydrochlorothiazide Swelling    Swelling of the tongue    Beef-Derived Drug Products Swelling    Facial and lip swelling (due to tick bite)   Dairy Aid [Tilactase] Swelling   Lisinopril-Hydrochlorothiazide Swelling    Tongue swelling   Pork-Derived Products Swelling    Outpatient Encounter Medications as of 08/25/2023  Medication Sig   acetaminophen  (TYLENOL ) 500 MG tablet Take 500 mg by mouth every 12 (twelve) hours as needed for mild pain. (Patient taking differently: Take 1,000 mg by mouth 3 (three) times daily.)   acetic acid 0.25 % irrigation Use 30ml via irrigation twice daily to keep foley Cath patent.   alendronate (FOSAMAX) 70 MG tablet Take 70 mg by mouth every Saturday.   calcium  carbonate (CALCIUM  600) 600 MG TABS tablet Take 600 mg by mouth at bedtime.   Capsaicin  0.1 % CREA Apply 1 application  topically every evening. (Apply to hands)   cetirizine (ZYRTEC) 5 MG tablet Take 5 mg by mouth 2 (two) times daily. (Patient taking differently: Take 5 mg by mouth daily.)   cholecalciferol  (VITAMIN D3) 25 MCG (1000 UNIT) tablet Take 1,000 Units by mouth daily.   DULoxetine  (CYMBALTA ) 60 MG capsule Take 60 mg by mouth daily.   ferrous sulfate  325 (65 FE) MG tablet Take 325 mg by mouth daily with breakfast.   fluocinonide (LIDEX) 0.05 % external solution Apply 1 Application topically every 12 (twelve) hours as needed.   gabapentin  (NEURONTIN ) 100 MG capsule Take 1 capsule (100 mg total) by mouth daily.   hydroxypropyl methylcellulose / hypromellose (ISOPTO TEARS / GONIOVISC) 2.5 % ophthalmic solution Place 1 drop into both eyes every hour as needed for dry eyes.   ketoconazole (NIZORAL) 2 % shampoo Apply 1 Application topically as directed. Every shift on Thursday for seborrheic dermatitis flare up   lactulose , encephalopathy, (CHRONULAC ) 10 GM/15ML SOLN Take 30 mLs by mouth daily as needed (mild constipation).   magnesium  citrate solution Take 60-120 mLs by mouth daily as needed (constipation).   Menthol , Topical Analgesic, (BIOFREEZE COOL THE  PAIN) 4 % GEL Apply 1 application  topically every 8 (eight) hours as needed (for left knee pain).   methylcellulose oral powder Take 1 packet by mouth at bedtime.   midodrine  (PROAMATINE ) 10 MG tablet Take 10 mg by mouth 3 (three) times daily.   Multiple Vitamin (MULTIVITAMIN WITH MINERALS) TABS tablet Take 1 tablet by mouth daily.   olopatadine (PATADAY) 0.1 % ophthalmic solution Place 1 drop into both eyes 2 (two) times daily.   polyethylene glycol (MIRALAX  / GLYCOLAX ) 17 g packet Take 17 g by mouth daily as needed.   rivaroxaban  (XARELTO ) 10 MG TABS tablet Take 10 mg by mouth daily.   saccharomyces boulardii (FLORASTOR) 250 MG capsule Take 250 mg by mouth daily.   Saline Spray 0.2 % SOLN Place 2 sprays into both nostrils as needed (nasal congestion).   simethicone  (MYLICON) 80 MG chewable tablet Chew 80 mg by mouth every 6 (six) hours as needed for flatulence.   sodium phosphate  (FLEET) 7-19 GM/118ML ENEM Place 1 enema rectally every other day as needed for severe constipation.   spironolactone  (ALDACTONE ) 25 MG tablet Take 0.5 tablets (12.5 mg total) by mouth daily.   tiZANidine  (ZANAFLEX ) 2 MG tablet Take 1 tablet (2 mg total)  by mouth 2 (two) times daily.   triamcinolone  cream (KENALOG ) 0.1 % Apply to thigh and lower legs topically every 8 hours as needed. Apply to back of neck topically as needed.   acetaminophen  (TYLENOL ) 650 MG CR tablet Take 650 mg by mouth every 8 (eight) hours as needed for pain. (Patient not taking: Reported on 08/25/2023)   Infant Care Products [facility]) OINT Apply to buttocks/gluteal folds topically every shift. (Patient not taking: Reported on 08/25/2023)   mometasone  (ELOCON ) 0.1 % lotion Apply 1 Application topically every Saturday at 6 PM. (Apply to the ear canal for eczema) (Patient not taking: Reported on 08/25/2023)   Zinc Oxide (TRIPLE PASTE) 12.8 % ointment Apply 1 Application topically. Every shift. (Patient not taking: Reported on 08/25/2023)   No  facility-administered encounter medications on file as of 08/25/2023.    Review of Systems  Constitutional:  Negative for activity change, appetite change, fatigue and unexpected weight change.  HENT:  Negative for congestion and hearing loss.   Eyes: Negative.   Respiratory:  Negative for cough and shortness of breath.   Cardiovascular:  Negative for chest pain, palpitations and leg swelling.  Gastrointestinal:  Positive for abdominal distention. Negative for abdominal pain, constipation and diarrhea.  Genitourinary:  Negative for difficulty urinating and dysuria.  Musculoskeletal:  Negative for arthralgias and myalgias.  Skin:  Negative for color change and wound.  Neurological:  Positive for weakness. Negative for dizziness.  Psychiatric/Behavioral:  Negative for agitation, behavioral problems and confusion.      Immunization History  Administered Date(s) Administered   Influenza, High Dose Seasonal PF 11/17/2021   Influenza-Unspecified 11/14/2018, 11/20/2019, 11/17/2020, 11/17/2021, 11/24/2022   Moderna Covid-19 Fall Seasonal Vaccine 65yrs & older 05/11/2022, 05/13/2023   Moderna Covid-19 Vaccine Bivalent Booster 37yrs & up 06/30/2021, 12/11/2021, 10/29/2022   Moderna Sars-Covid-2 Vaccination 02/09/2019, 03/09/2019, 12/14/2019, 06/20/2020, 10/24/2020   PPD Test 05/16/2019   Tdap 11/29/2017   Zoster Recombinant(Shingrix) 09/02/2022, 12/04/2022   Pertinent  Health Maintenance Due  Topic Date Due   INFLUENZA VACCINE  09/02/2023   DEXA SCAN  Discontinued      08/19/2021   10:00 AM 08/19/2021    8:54 PM 08/20/2021    8:20 AM 03/29/2022   10:11 AM 08/31/2022    2:42 PM  Fall Risk  Falls in the past year?    0 0  Was there an injury with Fall?    0   Fall Risk Category Calculator    0   (RETIRED) Patient Fall Risk Level High fall risk  High fall risk  High fall risk     Patient at Risk for Falls Due to    No Fall Risks      Data saved with a previous flowsheet row definition    Functional Status Survey:    Vitals:   08/25/23 0851 08/25/23 1151  BP: (!) 156/93 (!) 156/93  Pulse: 66   Resp: 18   Temp: (!) 96.5 F (35.8 C)   SpO2: 96%   Weight: 190 lb 12.8 oz (86.5 kg)   Height: 5' 7 (1.702 m)    Body mass index is 29.88 kg/m. Physical Exam Constitutional:      General: She is not in acute distress.    Appearance: She is well-developed. She is not diaphoretic.  HENT:     Head: Normocephalic and atraumatic.     Mouth/Throat:     Pharynx: No oropharyngeal exudate.  Eyes:     Conjunctiva/sclera: Conjunctivae normal.  Pupils: Pupils are equal, round, and reactive to light.  Cardiovascular:     Rate and Rhythm: Normal rate and regular rhythm.     Heart sounds: Normal heart sounds.  Pulmonary:     Effort: Pulmonary effort is normal.     Breath sounds: Normal breath sounds.  Abdominal:     General: Bowel sounds are normal.     Palpations: Abdomen is soft.  Musculoskeletal:     Cervical back: Normal range of motion and neck supple.     Right lower leg: No edema.     Left lower leg: No edema.  Skin:    General: Skin is warm and dry.  Neurological:     Mental Status: She is alert.  Psychiatric:        Mood and Affect: Mood normal.     Labs reviewed: Recent Labs    06/03/23 0000 08/10/23 0000 08/18/23 0000  NA 136* 133* 139  K 4.3 4.5 5.0  CL 99 99 101  CO2 32* 27* 32*  BUN 9 10 10   CREATININE 0.5 0.5 0.5  CALCIUM  8.9 8.9 9.3   Recent Labs    09/29/22 0000 05/09/23 0000  AST 20 21  ALT 14 16  ALKPHOS 58 59  ALBUMIN  3.6 3.6   Recent Labs    09/30/22 0000 05/09/23 0000 08/10/23 0000  WBC 4.7 4.6 6.3  NEUTROABS 2,627.00 2,272.00 4,038.00  HGB 13.2 12.0 11.8*  HCT 41 38 36  PLT 280 296 280   Lab Results  Component Value Date   TSH 1.31 09/29/2022   No results found for: HGBA1C No results found for: CHOL, HDL, LDLCALC, LDLDIRECT, TRIG, CHOLHDL  Significant Diagnostic Results in last 30 days:  No  results found.  Assessment/Plan Chronic pain syndrome Doing well with scheduled tylenol  1 gm TID  Constipation due to slow transit Well controlled at this time.   Hypertensive heart disease with other congestive heart failure (HCC) Currently on aldactone  due to fluid retention but due to orthostatics will stop and monitor  Neurogenic bladder Continues on foley, reports functioning well at this time. Continues routine flushes  Orthostatic hypotension Orthostatic hypotension with symptoms of weakness, vision changes, and bradycardia.  - Refer to cardiologist for evaluation of orthostatic hypotension and potential cardiac causes. - Order comprehensive thyroid panel including TSH, T3, and T4. - Order repeat blood counts, electrolytes, kidney, and liver function tests. - Discontinue spironolactone  temporarily to assess its impact on blood pressure. - Order abdominal and chest x-rays to evaluate for contributing factors to hypotension. - Order EKG to assess cardiac function   Tetraparesis (HCC) Partial post cervical spine fracture Continues supportive care.   Age-related osteoporosis without current pathological fracture On alendronate for osteoporosis since hip fracture three years ago. - Continue alendronate as prescribed.     Mell Guia K. Caro BODILY [facility] [phone]

## 2023-08-25 NOTE — Assessment & Plan Note (Signed)
 On alendronate for osteoporosis since hip fracture three years ago. - Continue alendronate as prescribed.

## 2023-08-25 NOTE — Assessment & Plan Note (Signed)
 Continues on foley, reports functioning well at this time. Continues routine flushes

## 2023-08-25 NOTE — Assessment & Plan Note (Signed)
 Orthostatic hypotension with symptoms of weakness, vision changes, and bradycardia.  - Refer to cardiologist for evaluation of orthostatic hypotension and potential cardiac causes. - Order comprehensive thyroid panel including TSH, T3, and T4. - Order repeat blood counts, electrolytes, kidney, and liver function tests. - Discontinue spironolactone  temporarily to assess its impact on blood pressure. - Order abdominal and chest x-rays to evaluate for contributing factors to hypotension. - Order EKG to assess cardiac function

## 2023-08-25 NOTE — Assessment & Plan Note (Signed)
 Currently on aldactone  due to fluid retention but due to orthostatics will stop and monitor

## 2023-08-29 ENCOUNTER — Ambulatory Visit: Admitting: Occupational Therapy

## 2023-08-29 ENCOUNTER — Ambulatory Visit

## 2023-08-29 LAB — HEPATIC FUNCTION PANEL
ALT: 12 U/L (ref 7–35)
AST: 17 (ref 13–35)
Alkaline Phosphatase: 53 (ref 25–125)
Bilirubin, Total: 0.5

## 2023-08-29 LAB — COMPREHENSIVE METABOLIC PANEL WITH GFR
Albumin: 3.6 (ref 3.5–5.0)
Calcium: 9.5 (ref 8.7–10.7)
Globulin: 2.2
eGFR: 91

## 2023-08-29 LAB — BASIC METABOLIC PANEL WITH GFR
BUN: 9 (ref 4–21)
CO2: 31 — AB (ref 13–22)
Chloride: 100 (ref 99–108)
Creatinine: 0.5 (ref 0.5–1.1)
Glucose: 73
Potassium: 4.7 meq/L (ref 3.5–5.1)
Sodium: 139 (ref 137–147)

## 2023-08-29 LAB — CBC AND DIFFERENTIAL
HCT: 37 (ref 36–46)
Hemoglobin: 12.2 (ref 12.0–16.0)
Neutrophils Absolute: 2528
Platelets: 305 K/uL (ref 150–400)
WBC: 4.9

## 2023-08-29 LAB — TSH: TSH: 1.15 (ref 0.41–5.90)

## 2023-08-29 LAB — CBC: RBC: 3.92 (ref 3.87–5.11)

## 2023-08-31 ENCOUNTER — Ambulatory Visit

## 2023-08-31 ENCOUNTER — Ambulatory Visit: Admitting: Occupational Therapy

## 2023-09-05 ENCOUNTER — Ambulatory Visit: Attending: Internal Medicine | Admitting: Occupational Therapy

## 2023-09-05 ENCOUNTER — Ambulatory Visit

## 2023-09-05 DIAGNOSIS — S72001A Fracture of unspecified part of neck of right femur, initial encounter for closed fracture: Secondary | ICD-10-CM | POA: Diagnosis present

## 2023-09-05 DIAGNOSIS — R262 Difficulty in walking, not elsewhere classified: Secondary | ICD-10-CM | POA: Diagnosis present

## 2023-09-05 DIAGNOSIS — R2689 Other abnormalities of gait and mobility: Secondary | ICD-10-CM | POA: Diagnosis present

## 2023-09-05 DIAGNOSIS — R278 Other lack of coordination: Secondary | ICD-10-CM

## 2023-09-05 DIAGNOSIS — S72002A Fracture of unspecified part of neck of left femur, initial encounter for closed fracture: Secondary | ICD-10-CM | POA: Diagnosis present

## 2023-09-05 DIAGNOSIS — M6281 Muscle weakness (generalized): Secondary | ICD-10-CM | POA: Diagnosis present

## 2023-09-05 DIAGNOSIS — R2681 Unsteadiness on feet: Secondary | ICD-10-CM | POA: Diagnosis present

## 2023-09-05 NOTE — Therapy (Unsigned)
 OUTPATIENT PHYSICAL THERAPY NEURO TREATMENT  Patient Name: Sherry [name] MRN: [id number] DOB:May 06, 1934, 89 y.o., female Today's Date: 09/06/2023   PCP: Sherry Brigham, MD REFERRING PROVIDER: Cleotilde [name]   PT End of Session - 09/05/23 1152     Visit Number 125    Number of Visits 139    Date for PT Re-Evaluation 09/12/23    Progress Note Due on Visit 130    PT Start Time 1145    PT Stop Time 1220    PT Time Calculation (min) 35 min    Equipment Utilized During Treatment Gait belt    Activity Tolerance Patient tolerated treatment well;No increased pain    Behavior During Therapy WFL for tasks assessed/performed                         Past Medical History:  Diagnosis Date   Acute blood loss anemia    Arthritis    Cancer (HCC)    skin   Central cord syndrome at C6 level of cervical spinal cord (HCC) 11/29/2017   Hypertension    Protein-calorie malnutrition, severe (HCC) 01/24/2018   S/P insertion of IVC (inferior vena caval) filter 01/24/2018   Tetraparesis (HCC)    Past Surgical History:  Procedure Laterality Date   ANTERIOR CERVICAL DECOMP/DISCECTOMY FUSION N/A 11/29/2017   Procedure: Cervical five-six, six-seven Anterior Cervical Decompression Fusion;  Surgeon: Sherry Debby DELENA, MD;  Location: [facility] OR;  Service: Neurosurgery;  Laterality: N/A;  Cervical five-six, six-seven Anterior Cervical Decompression Fusion   CATARACT EXTRACTION     FEMUR IM NAIL Left 08/16/2021   Procedure: INTRAMEDULLARY (IM) NAIL FEMORAL;  Surgeon: Sherry Barrio, MD;  Location: [facility] ORS;  Service: Orthopedics;  Laterality: Left;   IR IVC FILTER PLMT / S&I /IMG GUID/MOD SED  12/08/2017   Patient Active Problem List   Diagnosis Date Noted   Chronic pain syndrome 08/25/2023   Orthostatic hypotension 08/25/2023   Age-related osteoporosis without current pathological fracture 07/27/2022   Mild recurrent major depression (HCC) 07/27/2022   Hypertensive heart disease with  other congestive heart failure (HCC) 02/14/2022   Chronic indwelling Foley catheter 02/14/2022   Closed hip fracture (HCC) 08/15/2021   DVT (deep venous thrombosis) (HCC) 08/15/2021   Quadriplegia (HCC) 08/15/2021   Fall 08/15/2021   Constipation due to slow transit 08/31/2018   Trauma 06/05/2018   Neuropathic pain 06/05/2018   Neurogenic bowel 06/05/2018   Vaginal yeast infection 01/30/2018   Healthcare-associated pneumonia 01/25/2018   Chronic allergic rhinitis 01/24/2018   Depression with anxiety 01/24/2018   UTI due to Klebsiella species 01/24/2018   Protein-calorie malnutrition, severe (HCC) 01/24/2018   S/P insertion of IVC (inferior vena caval) filter 01/24/2018   Tetraparesis (HCC) 01/20/2018   Neurogenic bladder 01/20/2018   Reactive depression    Benign essential HTN    Acute postoperative anemia due to expected blood loss    Central cord syndrome at C6 level of cervical spinal cord (HCC) 11/29/2017   Allergy to alpha-gal 11/25/2016   SCC (squamous cell carcinoma) 04/08/2014    ONSET DATE: 08/15/2021 (fall with B hip fx); Initial injury was in 2019- quadraplegia due to central cord syndrome of C6 secondary to neck fracture.   REFERRING DIAG: Bilateral Hip fx; Left Open- s/p Left IM nail femoral on 08/16/2021; Right closed  THERAPY DIAG:  Muscle weakness (generalized)  Other lack of coordination  Other abnormalities of gait and mobility  Difficulty in walking, not elsewhere classified  Unsteadiness on  feet  Closed fracture of both hips, initial encounter [facility])  Rationale for Evaluation and Treatment Rehabilitation  SUBJECTIVE:   SUBJECTIVE STATEMENT:   Patient reports having a good day so far- States has been seen by Cardiology and having more test this week.      Pt accompanied by: Paid caregiver and dtr-   PERTINENT HISTORY: Sherry [name] is an 87yoF who experienced a fall at home on 08/15/2021 with hip fracture, s/p Left hip ORIF. PMH: quadriplegia  due to central cord syndrome of C6 secondary to neck fracture, HTN, depression with anxiety, neurogenic bladder, bilateral DVT on Xarelto  (s/p of IVC filter placement). Prior history of significant PT/OT with improvement of function. Pt has full use of shoulder/elbows, limited use of hands, adaptive self feeding sucessful.     PAIN:  None current but does endorse some left thigh pain intermittent  OBJECTIVE:   TODAY'S TREATMENT:   09/05/23      Therex:  Passive quad stretching in seated- static hold x 2 min x 2 Active ham curl  2 x 10 reps each LE Active LAQ 2 x 15 reps each LE Eccentric LAQ x 5 each LE with 3 sec hold  Manually Resistive hip add- 2x 10 reps AAROM Hip abd 2 x 10 reps  Passive hamstring stretching hold 30 sec x 3 each LE   PATIENT EDUCATION: Education details: Exercise technique Patient verbalized understanding.   HOME EXERCISE PROGRAM: No changes at this time  Access Code: [facility] URL: [email or web address] Date: 11/23/2021 Prepared by: Sherry  [name] Exercises - Seated Gluteal Sets - 2 x daily - 7 x weekly - 2 sets - 10 reps - 5 hold - Seated Quad Set - 2 x daily - 7 x weekly - 2 sets - 10 reps - 5 hold - Seated Long Arc Quad - 2 x daily - 7 x weekly - 2 sets - 10 reps - 5 hold - Seated March - 2 x daily - 7 x weekly - 2 sets - 10 reps - 5 hold - Seated Hip Abduction - 2 x daily - 7 x weekly - 2 sets - 10 reps - 5 hold - Seated Shoulder Shrugs - 2 x daily - 7 x weekly - 2 sets - 10 reps - 5 hold - Wheelchair Pressure Relief - 2 x daily - 7 x weekly - 2 sets - 10 reps - 5 hold     GOALS: Goals reviewed with patient? Yes  SHORT TERM GOALS: Target date: 02/22/2022  Pt will be independent with initial UE strengthening HEP in order to improve strength and balance in order to decrease fall risk and improve function at home and work. Baseline: 10/19/2021- patient with no formal UE HEP; 12/02/2021= Patient verbalized knowledge of HEP including use of  theraband for UE strengthening.  Goal status: GOAL MET  LONG TERM GOALS: Target date: 09/12/2023   Pt will be independent with final for UE/LE HEP in order to improve strength and balance in order to decrease fall risk and improve function at home and work. Baseline: Patient is currently BLE NWB and unable to participate in HEP. Has order for UE strengthening. 01/11/2022- Patient now able to participate in LE strengthening although NWB still- good understanding for some basic exercises. Will keep goal active to incorporate progressive LE strengthening exercises. 04/07/2022= Patient still participating in progressive LE seated HEP and has no questions at this time.  Goal status: MET  2.  Pt will improve FOTO to target score of  35  to display perceived improvements in ability to complete ADL's.  Baseline: 10/19/2021= 12; 12/02/2021= 17; 01/11/2022=17; 04/07/2022= 17; 07/05/2022=17; 10/06/2022- outcome measure not appropriate as patient in non-ambulatory and not consistently standing. Goal status: Goal not appropriate  3.  Pt will increase strength of B UE  by at least 1/2 MMT grade in order to demonstrate improvement in strength and function  Baseline: patient has range of 2-/5 to 4/5 BUE Strength; 12/02/2021= 4/5 except with wrist ext. 01/11/2022= 4/5 B UE strength expept for wrist Goal status: Goal revised-no longer appropriate- working with OT on all UE strengthening.   4. Pt. Will increase strength of RLE by at least 1/2 MMT grade in order to demonstrate improvement in Standing/transfers.  Baseline: 2-/5 Right hip flex/knee ext/flex; 04/07/2022= 2- with right hip flex/knee ext (lacking 28 deg from zero) 4/8= Patient able to ext right knee lacking 18 deg from zero. 07/05/2022=Left knee approx 8 deg from zero and right knee = 23 deg from zero; 12/29/2022 - Left LE = full ROM 4-/5 and right knee ext= 23 deg from zero= 2+/5 (gravity minimized); 03/28/2023= Left knee ext=  4/5 and right knee ext= 3-/5 (able to move  lower leg through incomplete ROM -lacking 20 deg from full ROM against gravity); 05/23/2023= right knee ext= 3-/5 (able to move lower leg through incomplete ROM -lacking 17 deg from full ROM against gravity); 06/20/2023= Right knee ext= lacking 20 deg from zero  Goal status: Progressing  5. Pt. Will demo ability to stand pivot transfer with max assist for improved functional mobility and less dependent need on mechanical device.   Baseline: Dependent on hoyer lift for all transfers; 04/07/2022- Unable to test secondary to patient with recent UTI and right hand procedure - will attempt to reassess next visit. ; 04/12/22: Unable to successfully stand due to feet plates on loaned power w/c; 05/10/2022- Patient still having to use rental chair and has not received her original power w/c back to practice SPT. 07/04/2021= Patient just received new power w/c and attempted standing from 1st time today- Patient able to stand with max assist today from w/c and did not attempt to perform pivot transfer- difficulty with static standing and placing weight on right LE. 10/06/2022- Not assessed today- patient needs new AFO prior to attempting transfers; 12/29/2022= Patient has not attempted SPT yet - still trying to improve LE strength enough to stand well without knee buckling with weight shifting. 03/28/2023- not attempted today due to ill fitting brace but patient last performed SPT on 01/10/23 and was able to perform with Max A with feet pre-positioned to angle toward direction of transfer; 05/23/2023- Not tested today but on last visit- patient able to perform SPT with max assist yet concern for twisting knee as she was unable to turn/pivot foot .  06/20/2023- deferred to next session due to patient fatigued with static standing today.  Goal status: ONGOING  6. Pt. Will demonstrate improved functional LE strength as seen by ability to stand > 2 min for improved transfer ability and pregait abilities.   Baseline: not assessed today  due to recent dx: UTI; 04/07/2022- Unable to test secondary to patient with recent UTI and right hand procedure - will attempt to reassess next visit; 04/12/22: Unable to successfully stand due to feet plates on loaned power w/c 05/10/2022- Patient still having to use rental chair and has not received her original power w/c back to practice Standing. 07/05/2022- Patient able to stand with max assist at trunk  today for 2 min 3 sec. Will keep goal active to ensure consistency. 10/06/2022= Unable to assess today- patient with some recent blood pressure issues and needs new AFO. 11/24/2022= Patient demonstrated multiple rounds of standing in // bars- holding up to 1:45 min today but has been able to exhibit 2 min in recent past- working on consistency. 12/29/2022= 2 min 40 sec with patient demonstrating improved overall posture and ability to contract glutes and quads with just min assist at trunk today.   Goal status: MET  7. Pt. Will demonstrate improved functional LE strength as seen by ability to stand > 5 min for improved transfer ability and pregait abilities. Baseline: 12/29/2022= 2 min 40 sec with patient demonstrating improved overall posture and ability to contract glutes and quads with just min assist at trunk today. 03/28/2023= Attempted 2 trials today - at best 2 min 48 sec today but during cert- did stand for 3 min 44 sec. 06/20/2023= Patient able to stand 2x (45 sec and 1 min 12 sec)- max assist yet no slippage of LLE.  07/13/2023= Patient able to stand x 3 trials today- each 1-2 min focusing on posture- patient with improving gluteal activation and less physical assist once in standing position. Goal Status: Ongoing  8. Patient will perform sit to stand transfer with moderate assist consistently > 75% of time for improved transfer ability and less dependence on caregiver.   Baseline: 12/29/2022- Patient currently max assist level assistance with all sit to stand activities. 03/28/2023= Patient is still  majority of max assist to stand yet able to pull up from wheelchair smoother in one motion. 06/20/2023- Max Assist but has only most recently resumed standing since last week so goal still appropriate for new cert; 3/88/7974=dupoo max assist but less overall assist on last trial of standing with improved forward lean and gluteal activation.   Goal status: ONGOING   ASSESSMENT:  CLINICAL IMPRESSION:   Treatment was modified to just some seated LE strengthening secondary to patient reporting having some cardiac issues and going to have some more test this week. Will hold on transfer/standing til receive some update from cardiology. Keep the intensity at moderate- modifiying her reps and activities as appropriate . Pt will continue to benefit from skilled PT services to optimize independence and reduced caregiver support.   OBJECTIVE IMPAIRMENTS Abnormal gait, decreased activity tolerance, decreased balance, decreased coordination, decreased endurance, decreased mobility, difficulty walking, decreased ROM, decreased strength, hypomobility, impaired flexibility, impaired UE functional use, postural dysfunction, and pain.   ACTIVITY LIMITATIONS carrying, lifting, bending, sitting, standing, squatting, sleeping, stairs, transfers, bed mobility, continence, bathing, toileting, dressing, self feeding, reach over head, hygiene/grooming, and caring for others  PARTICIPATION LIMITATIONS: meal prep, cleaning, laundry, medication management, personal finances, interpersonal relationship, driving, shopping, community activity, and yard work  PERSONAL FACTORS Age, Time since onset of injury/illness/exacerbation, and 1-2 comorbidities: HTN, cervical Sx are also affecting patient's functional outcome.   REHAB POTENTIAL: Good  CLINICAL DECISION MAKING: Evolving/moderate complexity  EVALUATION COMPLEXITY: Moderate  PLAN: PT FREQUENCY: 1-2x/week  PT DURATION: 12 weeks  PLANNED INTERVENTIONS: Therapeutic  exercises, Therapeutic activity, Neuromuscular re-education, Balance training, Gait training, Patient/Family education, Self Care, Joint mobilization, DME instructions, Dry Needling, Electrical stimulation, Wheelchair mobility training, Spinal mobilization, Cryotherapy, Moist heat, and Manual therapy  PLAN FOR NEXT SESSION:   Continue with LE and core strength training  Sit to stand and static standing trials to promote improved standing. SPT as able.     7:25 AM, 09/06/23  7:25  AM, 09/06/23 Chyrl London, PT Physical Therapist - [place] [facility] [phone]

## 2023-09-05 NOTE — Therapy (Addendum)
 Occupational Therapy Treatment Note  Patient Name: Sherry [name] MRN: [id number] DOB:1934-08-22, 89 y.o., female Today's Date: 11/29/2022  PCP:  [facility] PROVIDER: Abdul Fine, MD  END OF SESSION:   OT End of Session - 09/05/23 1738     Visit Number 134    Number of Visits 192    Date for OT Re-Evaluation 10/05/23    OT Start Time 1100    OT Stop Time 1145    OT Time Calculation (min) 45 min    Equipment Utilized During Treatment powered wheelchair    Activity Tolerance Patient tolerated treatment well    Behavior During Therapy WFL for tasks assessed/performed                        Past Medical History:  Diagnosis Date   Acute blood loss anemia    Arthritis    Cancer (HCC)    skin   Central cord syndrome at C6 level of cervical spinal cord (HCC) 11/29/2017   Hypertension    Protein-calorie malnutrition, severe (HCC) 01/24/2018   S/P insertion of IVC (inferior vena caval) filter 01/24/2018   Tetraparesis (HCC)    Past Surgical History:  Procedure Laterality Date   ANTERIOR CERVICAL DECOMP/DISCECTOMY FUSION N/A 11/29/2017   Procedure: Cervical five-six, six-seven Anterior Cervical Decompression Fusion;  Surgeon: Cheryle Debby DELENA, MD;  Location: [facility] OR;  Service: Neurosurgery;  Laterality: N/A;  Cervical five-six, six-seven Anterior Cervical Decompression Fusion   CATARACT EXTRACTION     FEMUR IM NAIL Left 08/16/2021   Procedure: INTRAMEDULLARY (IM) NAIL FEMORAL;  Surgeon: Cleotilde Barrio, MD;  Location: [facility] ORS;  Service: Orthopedics;  Laterality: Left;   IR IVC FILTER PLMT / S&I /IMG GUID/MOD SED  12/08/2017   Patient Active Problem List   Diagnosis Date Noted   Age-related osteoporosis without current pathological fracture 07/27/2022   Mild recurrent major depression (HCC) 07/27/2022   Hypertensive heart disease with other congestive heart failure (HCC) 02/14/2022   Chronic indwelling Foley catheter 02/14/2022   Closed hip fracture (HCC)  08/15/2021   DVT (deep venous thrombosis) (HCC) 08/15/2021   Quadriplegia (HCC) 08/15/2021   Fall 08/15/2021   Constipation due to slow transit 08/31/2018   Trauma 06/05/2018   Neuropathic pain 06/05/2018   Neurogenic bowel 06/05/2018   Vaginal yeast infection 01/30/2018   Healthcare-associated pneumonia 01/25/2018   Chronic allergic rhinitis 01/24/2018   Depression with anxiety 01/24/2018   UTI due to Klebsiella species 01/24/2018   Protein-calorie malnutrition, severe (HCC) 01/24/2018   S/P insertion of IVC (inferior vena caval) filter 01/24/2018   Tetraparesis (HCC) 01/20/2018   Neurogenic bladder 01/20/2018   Reactive depression    Benign essential HTN    Acute postoperative anemia due to expected blood loss    Central cord syndrome at C6 level of cervical spinal cord (HCC) 11/29/2017   Allergy to alpha-gal 11/25/2016   SCC (squamous cell carcinoma) 04/08/2014   ONSET DATE: 11/29/2017  REFERRING DIAG: Central Cord Syndrome at C6 of the Cervical Spinal Cord, Fall with Bilateral Closed Hip Fractures, with ORIF repair with Intramedullary Nailing of the Left Hip Fracture.   THERAPY DIAG:  Muscle weakness (generalized)  Other lack of coordination  Rationale for Evaluation and Treatment: Rehabilitation  SUBJECTIVE:  SUBJECTIVE STATEMENT:  Pt. reports that she used her reacher to pick up her toothbrush off of the floor.   Pt accompanied by: self, Personal Care aide  PERTINENT HISTORY: Central Cord Syndrome at C6 of the Cervical  Spinal Cord, Fall with Bilateral Closed Hip Fractures, with ORIF repair with Intramedullary Nailing of the Left Hip Fracture.   PRECAUTIONS: None  WEIGHT BEARING RESTRICTIONS: No  PAIN:  Are you having pain? No pain   LIVING ENVIRONMENT: Lives with: [facility] Stairs: No Has following equipment at home: Wheelchair (power)  PLOF: Independent   PATIENT GOALS:  See below for established goals  OBJECTIVE:  Note:  Objective measures were completed at Evaluation unless otherwise noted.  HAND DOMINANCE: Right  ADLs: Caregiver assist with ADLs, and [facility]  MOBILITY STATUS: Uses a power w/c  ACTIVITY TOLERANCE: Activity tolerance: Fair  FUNCTIONAL OUTCOME MEASURES:  Measurements:   12/09/2021:   Shoulder flexion: R: 120(136), L: 138(150) Shoulder abduction: R: 108(120), L: 110(120) Elbow: R: 0-148, L: 0-146 Wrist flexion: R: 55(62), L: 78 Writ extension: R: 34(55), L: 3(4) RD: R: 12(20), L: 16(26) UD: R: 8(24), L: 6(18) Thumb radial abduction: R: 11(20), L: 8(20) Digit flexion to the [facility]: R:  2nd: 5cm(3.5cm), 3rd: 7.5 cm(5cm), 4th: 7cm(3cm), 5th: 6cm(5.5cm) L: 2nd: 8cm(8cm), 3rd: 8cm(8cm), 4th: 7.5cm(7cm), 5th: 7cm(7cm)   02/03/2022:   Shoulder flexion: R: 122(138), L: 148(154) Shoulder abduction: R: 109(125), L: 125(125 Elbow: R: 0-148, L: 0-146 Wrist flexion: R: 60(68), L: 79 Writ extension: R: 40(55), L: 3(10) RD: R: 14(24), L: 20(28) UD: R: 8(24), L: 6(18) Thumb radial abduction: R: 20(25), L: 8(20) Digit flexion to the [facility]: R:  2nd: 4.5cm(3cm), 3rd: 6.5cm(4.5cm), 4th: 6.5 cm(3cm), 5th: 4.5cm(5cm) L: 2nd: 8cm(8cm), 3rd: 8cm(7cm), 4th: 7.5cm(7cm), 5th: 6.5cm(6.5cm)   04/07/2022:   Shoulder flexion: R: 125(150), L: 160(160) Shoulder abduction: R: 109(125), L: 125(125 Elbow: R: 0-148, L: 0-146 Wrist flexion: R: 60(68), L: 79 Writ extension: R: 40(55), L: 5(18) RD: R: 14(24), L: 20(28) UD: R: 18(24), L: 8(18) Thumb radial abduction: R: 20(25), L: 8(20) Digit flexion to the [facility]: R:  2nd: 4.5 cm(4 cm), 3rd: 6.5cm(3cm), 4th: 7 cm (5cm), 5th: 5cm(5cm) L: 2nd: 8cm(8cm), 3rd: 8cm(7cm), 4th: 7.5cm(7cm), 5th: 7cm(7cm)   07/05/2022:   Shoulder flexion: R: 125(154), L: 160(160) Shoulder abduction: R: 95(130), L: 143(143) Elbow: R: 0-148, L: 0-146 Wrist flexion: R: 60(68), L: 79 Writ extension: R: 40(60), L: 0(12) RD: R: 10(24), L: 12(20) UD: R: 16(24), L: 8(16) Thumb  radial abduction: R: 20(25), L: 8(20) Digit flexion to the [facility]: R:  2nd: 4 cm(4cm), 3rd: 6.5cm( 3.5cm), 4th: 7 cm (5cm), 5th: 6cm(5cm) L: 2nd: 8cm(8cm), 3rd: 8cm(7cm), 4th: 8cm(7cm), 5th: 7cm(7cm)   09/13/2022:   Shoulder flexion: R: 125(154), L: 160(160) Shoulder abduction: R: 100(130), L: 143(143) Elbow: R: 0-150, L: 0-150 Wrist flexion: R: 55(68), L: 79 Writ extension: R: 45(60), L: 0(12) RD: R: 10(24), L: 12(20) UD: R: 16(24), L: 8(16) Thumb radial abduction: R: 20(25), L: 8(20) Digit flexion to the [facility]: R:  2nd: 4 cm(4cm), 3rd: 6cm( 5cm), 4th: 6 cm (4cm), 5th: 5cm(5cm) L: 2nd: 7cm(cm), 3rd: 7cm(6cm), 4th: 7cm(6cm), 5th: 6cm(6cm)   10/27/2022   Shoulder flexion: R: 128(154), L: 160(160) Shoulder abduction: R: 105(130), L: 143(143) Elbow: R: 0-150, L: 0-150 Wrist flexion: R: 55(68), L: 79 Writ extension: R: 46(64), L: 0(20) RD: R: 10(20), L: 18(24) UD: R: 18(28), L: 8(20) Thumb radial abduction: R: 30(42), L:20(24) Digit flexion to the [facility]: R:  2nd: 4 cm(3cm), 3rd: 6cm( 6cm), 4th: 6 cm (5cm), 5th: 5cm(5cm) L: 2nd: 8cm(8cm), 3rd: 8cm(6cm), 4th: 8cm(7cm), 5th: 6cm(6cm)  01/19/2023  Shoulder flexion: R: 131(154), L: 160(160) Shoulder abduction: R: 110(130), L: 143(143)  Elbow: R: 0-150, L: 0-150 Wrist flexion: R: 55(68), L: 79 Writ extension: R: 46(64), L: 0(20) RD: R: 10(20), L: 18(24) UD: R: 18(28), L: 14(20) Thumb radial abduction: R: 38(48), L:24(26) Thumb IP flexion: R: 35(50) L: 20(45) Digit flexion to the [facility]: R:  2nd: 3.5 cm(3cm), 3rd: 6cm( 6cm), 4th: 6 cm (5cm), 5th: 5cm(4.5cm) L: 2nd: 8cm(7.5cm), 3rd: 8cm(6cm), 4th: 7.5cm(6cm), 5th: 6cm(4.5cm)  04/04/2023  Shoulder flexion: R: 134(154), L: 160(160) Shoulder abduction: R: 114(130), L: 143(143) Elbow: R: WNL, L: WNL Wrist flexion: R: 55(68), L: 80 Writ extension: R: 46(64), L: -10(10) RD: R: 14(24), L: 20(24) UD: R: 20(30), L: 14(20) Thumb radial abduction: R: 38(48), L:28(30) Thumb IP flexion: R: 40(50)  L: 25(45) Digit flexion to the [facility]: R:  2nd: 3.5 cm(3cm), 3rd: 6cm( 5cm), 4th: 6 cm (4cm), 5th: 5cm(5cm) L: 2nd: 8cm(7.5cm), 3rd: 8cm(7cm), 4th: 7.5cm(7cm), 5th: 7cm(6cm)  05/25/2023  Shoulder flexion: R: 134(154), L: 160(160) Shoulder abduction: R: 117(134), L: 143(143) Elbow: R: WNL, L: WNL Wrist flexion: R: 55(68), L: 80 Wrist extension: R: 46(64), L: -8(14) RD: R: 14(24), L: 20(24) UD: R: 20(32), L: 14(22) Thumb radial abduction: R: 38(50), L:28(30) Thumb IP flexion: R: 40(50) L: 30(45) Digit flexion to the [facility]: R:  2nd: 3.5 cm(3cm), 3rd: 6cm( 5cm), 4th: 7 cm (4cm), 5th: 5cm(4cm) L: 2nd: 8cm(7.5cm), 3rd: 8cm(7cm), 4th: 7.5cm(7cm), 5th: 7cm(6cm)  07/13/23  Shoulder flexion: R: 134(154), L: 160(160) Shoulder abduction: R: 122(134), L: 143(143) Elbow: R: WNL, L: WNL Wrist flexion: R: 55(68), L: 80 Wrist extension: R: 50(64), L: -4(18) RD: R: 18(24), L: 24(24) UD: R: 20(32), L: 14(22) Thumb radial abduction: R: 38(50), L:3230) Thumb IP flexion: R: 42(50) L: 30(45) Digit flexion to the [facility]: R:  2nd: 3.5 cm(3cm), 3rd: 6cm( 5cm), 4th: 7 cm (4cm), 5th: 5cm(4cm) L: 2nd: 8cm(7.5cm), 3rd: 8cm(7cm), 4th: 7.5cm(7cm), 5th: 7cm(6cm)   COORDINATION:   01/19/2023:  9 hole peg test:  Right: 2 pegs placed in 1 min. & 5 sec.  Pt. was able to remove 9 pegs in 17 sec.  Left: 2 pegs placed in 1 min. & 10 sec. Pt. was able to remove 9 pegs in 20 sec.  04/04/23:  Pt. was able to consistently grasp the pegs however each one slipped out of her fingers when attempting to place them into the Pegboard.  05/25/2023:   Right: 9 pegs placed and removed in 2 min. & 41 sec. Left: Unable to grasp the pegs for a horizontal position.  07/13/23    Right: 9 pegs placed and removed in 2 min. & 11 sec.   SENSATION: Intact  EDEMA: N/A   COGNITION: Overall cognitive status: WNL   VISION: Subjective report: Wears glasses, No changes in vision.  PERCEPTION: Intact  TODAY'S TREATMENT:                                                                                                                               DATE:  09/05/2023     Moist heat modality the the bilateral shoulder and hands for 6 min.  Therapeutic Ex.    -Pt. tolerated PROM bilateral wrist extension, PROM for bilateral digit MP, PIP, and DIP flexion, and extension, thumb abduction  --Performed BUE strengthening using a 2# dowel ex. 2/2 to weakness. Bilateral shoulder flexion, chest press, and Bimanual reaching for targets with the dowel. for 2 set  10 reps each.   Therapeutic Activities:   -Facilitated lateral grasp patterns removing 1 resistive cubes, and replacing them using isolated 2nd digit extension to press them into place in combination with the vertical board being positioned at multiple angles to promote functional reaching. -facilitated right hand [facility] skills grasping 1.5 flat discs, and reaching up through multiple planes to place them  through a targeted slot.      PATIENT EDUCATION: Education details: Bilateral hand and shoulder ROM, and reacher use Person educated: Patient Education method: Explanation, Demonstration, and Verbal cues Education comprehension: verbalized understanding and returned demonstration  HOME EXERCISE PROGRAM: Continue to assess HEP needs, and provide as needed.   GOALS: Goals reviewed with patient? Yes  LONG TERM GOALS: Target date: 10/05/2023  3.  Patient will independently use a reacher to pick up items from various surfaces.  Baseline: 07/13/2023: Pt. Is starting to use a reacher to retrieve items with an adaptation/modification to the trigger.05/25/2023:  Pt. is now able to initiate grasping a reacher to open and close the reacher with the right hand, however requires the grasp handle to be repositioned  within the hand. Pt. requires work on using/moving the reacher with the right hand through various planes. 04/04/2023: Pt. continues to work on improving Digit flexion to  the [facility] to be able to efficiently handle Adaptive equipment. 01/19/2023: Pt. Is improving with Bilateral digit flexion to the [facility]. Pt. has difficulty firmly holding adaptive devices. 12/06/2022: Pt. continues to present with increased MP, PIP, and DIP digit tightness/stiffness limiting the formulation of bilateral composites fists. 10/27/2022: Pt. presents with increased MP, PIP, and DIP digit tightness/stiffness limiting the formulation of bilateral composites fists during this progress reporting period limiting. Digit flexion to the [facility]: R:  2nd: 4 cm(3cm), 3rd: 6cm( 6cm), 4th: 6 cm (5cm), 5th: 5cm(5cm), L: 2nd: 8cm(8cm), 3rd: 8cm(6cm), 4th: 8cm(7cm), 5th: 6cm(6cm) 09/20/2022: Improving digit flexion to the [facility]. 09/13/22: improved digit flexion to the [facility]. 07/05/2022: Pt. presents with digit MP, PIP, and DIP extensor tightness limiting her ability to securely grip objects in her bilateral hands.  04/07/2022: Pt. Has improved with right 2nd, and 3rd digit flexion towards the [facility]. Pt. Continues to have difficulty securely holding and applying deodorant.02/03/2022: Pt. Has improved with digit flexion, however, continues to have difficulty securely holding and using deodorant. 12/09/2021: Bilateral hand/digit MP, PIP, and DIP extension tightness limits her ability achieve digit flexion to hold, and apply deodorant. 10/28/2021:  Pt. continues to have difficulty holding the deodorant. 01/12/2021: Pt. Presents with limited digit extension. Pt. Is able to initiate holding deodorant, however is unable to hold it while using it  Goal status: Ongoing   4.  Pt. Will improve bilateral wrist extension in preparation for anticipating, and initiating reaching for objects at the table.  Baseline: 07/13/23:  Wrist extension: R: 50(64), L: -4(18) 05/25/23: Wrist extension: R: 46(64), L: -8(14) 04/04/23: R: 46(64) L: -10(10)01/19/2023:  R: 46(64) L: 0(20) 12/06/2022: Pt continues to to progress with bilateral wrist extension in preparation  fro functional reaching. 10/27/2022: Pt. Is improving with bilateral wrist extension.  R: 46(64) L: 0(20) 09/20/2022: limited PROM in the Left wrist extension 2/2 tightness/stiffness. 09/13/22: R: 55(68), L: 0(12) 07/19/2022: Bilateral wrist extension in limited. 07/05/2022: Right: 40(60) Left: 0(12) 04/07/2022: Right: 40(55) Left: 5(18) 02/03/2022: Right: 34(55) Left: 3(4) 12/09/2021: Right  34(55)  Left  3(4) 10/28/2021:  Right 22(38), Left 0(15) 09/14/2021:  Right: 17(35), left 2(15)  Goal status: Progressing/Ongoing  7.  Pt. Will increase bilateral lateral pinch strength by 2 lbs to be able to securely grasp items during ADLs, and IADL tasks.  Baseline: 01/19/2023: Deferred as the Pinch meter is out for calibration.12/06/2022: Pt. Is able to more securely hold objects during ADLs/IADLs 10/27/2022: Pt. Is improving with holding items with her bilateral thumbs. (Pinch meter out for calibration) 09/20/2022: lateral pinch continues to be limited 09/13/22: R 6.5# L 4# 07/19/2022: TBD 07/05/2022: TBD 07/2022: NT-Pinch meter out for calibration. 02/03/2022: Right: 6#, Left: 4# 12/09/2021: Right: 6#, Left: 4#  Goal status: Deferred   9.  Pt. will complete plant care with modified independence.  Baseline:12/06/2022: Pt. Is independent with plant care in her husband's room, however is not able to access her plants due to furniture blocking access to them. 10/27/2022: Pt. Is able to water plants that are closer, and within reach. Pt. continues to have difficulty reaching for thorough plant care. 09/20/2022: Pt. Continues to have difficulty reaching for thorough plant care. 09/13/22: continues to report intermittent difficulty 07/19/2022: Pt. Continues to be able to water, and care for some of her plants. Pt. Has more difficulty with plants that are harder to reach. 07/05/2022: pt. Is able to water, and care for some of her plants. Pt. Has more difficulty with plants that are harder to reach. 04/07/2022: Pt. is now able to hold a cup and  water her plants.02/03/2022: Pt. has difficulty caring for her plants.  Goal status:Achieved   10.  Pt. will demonstrate adaptive techniques to assist with the efficiency of self-dressing, or morning care tasks.  Baseline: 07/13/23:Pt. continues to work on improving UE functioning to be able to efficiently use Adaptive equipment 05/25/2023: Pt. continues to work on improving UE functioning to be able to efficiently use Adaptive equipment.  04/04/2023:  Pt. continues to work on improving UE functioning to be able to efficiently use Adaptive equipment. 01/19/2203: Pt. Is able to donn her jacket in reverse independently. Pt. requires assist from staff, as Pt. Reports staff have decreased time. 12/06/2022: Pt. requires assist from staff, as Pt. Reports staff have decreased time. 10/27/2022: Pt. is able to assist with initiating UE dressing. Pt. requires assist from personal, and staff care aides. 09/20/2022: Pt. continues to require assist form personal, and staff care aides. 09/13/22: MODA dressing, reports she is often rushed 07/19/2022: Pt. continues to require assist with self-dressing/morning care tasks. 07/05/2022: Continue with goal. 04/07/2022: Pt. Continues to require assist with the efficiency of self-dressing, and morning care tasks.02/03/2022: Pt. requires assist from caregivers 2/2 time limitations during morning care.  Goal status: Ongoing  11.  Pt. will improve BUE strength to improve ADL, and IADL functioning.  Baseline: 07/13/23: shoulder flexion L 4/5, R 4-/5; shoulder abduction L 4+/5, R 4-/5. elbow flexion B 5/5, elbow extension 4+/5 01/19/2023: L 4/5, R 3+/5  05/25/2023: shoulder flexion L 4/5, R 4-/5; shoulder abduction L 4+/5, R 4-/5. elbow flexion B 5/5, elbow extension 4+/5 01/19/2023: L 4/5, R 3+/5  04/04/2023: shoulder flexion L 4/5, R 3+/5; shoulder abduction L 4+/5, R 3+/5. elbow flexion B 5/5, elbow extension 4/5 01/19/2023: L 4/5,  R 3+/5 12/06/2022: Continue 10/27/2022: shoulder flexion L 4/5, R  3+/5; shoulder abduction L 4+/5, R 3+/5. elbow flexion B 5/5, elbow extension 4/5 09/20/2022: Pt. Presents with limited BUE strength 09/13/22: shoulder flexion L 4/5, R 3/5; elbow flexion B 5/5, elbow extension 4-/5; shoulder abduction L 4+/5, R 3+/5. 07/19/2022: BUE strength continues to be limited. 07/05/2022: shoulder flexion: right 4-/5, abduction: 3+/5, elbow flexion: right: 5/5, left 5/5, extension: right: 4-/5, left 4-/5, wrist extension: right: 3-/5, left: 2-/5  Goal status: Ongoing  12.  Pt. will improve bilateral thumb radial abduction in order to be able to hold the grab bars while standing with PT  Baseline: 07/13/23: Pt. Is improving with holding the grab bars with the right hand .05/25/23: Thumb radial abduction: R: 38(50), L:28(30) 04/04/23: R: 38(48), L:28(30) 01/19/2023: R: 38(48) L: 24(26)12/06/2022: Pt. Continues to present  with limited thumb abduction, however is improving holding onto the parallel bars.10/27/2022: thumb radial abduction: R: 30(42), L: 20(24  Goal status: Ongoing  13.  Pt. Will be able to securely hold, and stabilize medication bottles at a tabletop surface when opening, and closing them.  Baseline: 01/19/2023: Pt. Is now able to securely hold medication bottles while opening them, and stabilizes them on surfaces while opening them. 12/06/2022: Pt is improving with securely stabilizing medication bottles. 10/27/2022: Pt. Stabilizes bottles against her torso when attempting to open, and close them   Goal status: Achieved  14: Pt. Will improve bilateral thumb IP flexion to improve active grasping patterns.    Baseline: 07/13/23: Thumb IP flexion: R: 42(50) L: 30(45) 05/25/2023: Thumb IP flexion: R: 40(50) L: 30(45) 04/04/2023:  R: 40(50) L: 25(45) 01/19/2023: Right 35(50), Left: 20/45)    Goal status: Ongoing    15. Pt. Will improve bilateral FMC/speed, and dexterity. As evidence by improved scores on the 9 hole peg test.  Baseline: 07/13/2023: Right: 9 pegs placed and removed  in 2 min. & 11 sec. 05/25/2023: 9 Hole pegs test speed/dexterity: Right: 9 pegs placed and removed in 2 min. & 41 sec. Left: Unable to grasp the pegs for a horizontal position. 04/04/23: Pt. was able to consistently grasp the pegs however each one slipped out of her fingers when attempting to place them into the Pegboard. 01/19/2023: 2 pegs placed in 1 min. & 5 sec.  Pt. was able to remove 9 pegs in 17 sec.; Left: 2 pegs placed in 1 min. & 10 sec. Pt. was able to remove 9 pegs in 20 sec.   Goal status: Ongoing  ASSESSMENT:  CLINICAL IMPRESSION:  Upon arrival Pt. reported being well hydrated with Gatorade. Pt.'s BP 107/61 HR 66 bpms. Pt. Reports that she is used her reacher to pick up her toothbrush from the floor. Pt. tolerated the UE dowel exercises however required multiple rest breaks, as well as during reaching tasks.Pt. continues to benefit form OT services to work on impoving ROM, and UE strength in order to work towards increasing bilateral hand grasp on objects, and increasing engagement of bilateral hands during ADLs, and IADL tasks.      PERFORMANCE DE PERFORMANCE DEFICITS: in functional skills including ADLs, IADLs, coordination, dexterity, ROM, strength, and UE functional use, cognitive skills including , and psychosocial skills including coping strategies and environmental adaptation.   IMPAIRMENTS: are limiting patient from ADLs, IADLs, and leisure.   CO-MORBIDITIES: may have co-morbidities  that affects occupational performance. Patient will benefit from skilled OT to address above impairments and improve overall function.  MODIFICATION OR ASSISTANCE TO COMPLETE  EVALUATION: Min-Moderate modification of tasks or assist with assess necessary to complete an evaluation.  OT OCCUPATIONAL PROFILE AND HISTORY: Detailed assessment: Review of records and additional review of physical, cognitive, psychosocial history related to current functional performance.  CLINICAL DECISION MAKING: Moderate  - several treatment options, min-mod task modification necessary  REHAB POTENTIAL: Good for stated goals  PLAN:  OT FREQUENCY 2x's a week  OT DURATION: 12 weeks  PLANNED INTERVENTIONS ADL training, A/E training, UE ther. Ex, Manual therapy, neuromuscular re-education, moist heat modality, Paraffin Bath, Splinting, and  Pt./caregiver education   RECOMMENDED OTHER SERVICES: PT  CONSULTED AND AGREED WITH PLAN OF CARE: Patient  PLAN FOR NEXT SESSION: Continue Treatment as per established POC  Richardson Otter, MS, OTR/L   09/05/23, 5:42 PM

## 2023-09-07 ENCOUNTER — Ambulatory Visit: Admitting: Occupational Therapy

## 2023-09-07 ENCOUNTER — Ambulatory Visit

## 2023-09-07 DIAGNOSIS — M6281 Muscle weakness (generalized): Secondary | ICD-10-CM

## 2023-09-07 NOTE — Therapy (Signed)
 Occupational Therapy Treatment Note  Patient Name: Sherry [name] MRN: [id number] DOB:1934-06-11, 89 y.o., female Today's Date: 11/29/2022  PCP:  [facility] PROVIDER: Abdul Fine, MD  END OF SESSION:   OT End of Session - 09/07/23 1708     Visit Number 135    Number of Visits 192    Date for OT Re-Evaluation 10/05/23    OT Start Time 1100    OT Stop Time 1145    OT Time Calculation (min) 45 min    Equipment Utilized During Treatment powered wheelchair    Activity Tolerance Patient tolerated treatment well    Behavior During Therapy WFL for tasks assessed/performed                        Past Medical History:  Diagnosis Date   Acute blood loss anemia    Arthritis    Cancer (HCC)    skin   Central cord syndrome at C6 level of cervical spinal cord (HCC) 11/29/2017   Hypertension    Protein-calorie malnutrition, severe (HCC) 01/24/2018   S/P insertion of IVC (inferior vena caval) filter 01/24/2018   Tetraparesis (HCC)    Past Surgical History:  Procedure Laterality Date   ANTERIOR CERVICAL DECOMP/DISCECTOMY FUSION N/A 11/29/2017   Procedure: Cervical five-six, six-seven Anterior Cervical Decompression Fusion;  Surgeon: Cheryle Debby DELENA, MD;  Location: [facility] OR;  Service: Neurosurgery;  Laterality: N/A;  Cervical five-six, six-seven Anterior Cervical Decompression Fusion   CATARACT EXTRACTION     FEMUR IM NAIL Left 08/16/2021   Procedure: INTRAMEDULLARY (IM) NAIL FEMORAL;  Surgeon: Cleotilde Barrio, MD;  Location: [facility] ORS;  Service: Orthopedics;  Laterality: Left;   IR IVC FILTER PLMT / S&I /IMG GUID/MOD SED  12/08/2017   Patient Active Problem List   Diagnosis Date Noted   Age-related osteoporosis without current pathological fracture 07/27/2022   Mild recurrent major depression (HCC) 07/27/2022   Hypertensive heart disease with other congestive heart failure (HCC) 02/14/2022   Chronic indwelling Foley catheter 02/14/2022   Closed hip fracture (HCC)  08/15/2021   DVT (deep venous thrombosis) (HCC) 08/15/2021   Quadriplegia (HCC) 08/15/2021   Fall 08/15/2021   Constipation due to slow transit 08/31/2018   Trauma 06/05/2018   Neuropathic pain 06/05/2018   Neurogenic bowel 06/05/2018   Vaginal yeast infection 01/30/2018   Healthcare-associated pneumonia 01/25/2018   Chronic allergic rhinitis 01/24/2018   Depression with anxiety 01/24/2018   UTI due to Klebsiella species 01/24/2018   Protein-calorie malnutrition, severe (HCC) 01/24/2018   S/P insertion of IVC (inferior vena caval) filter 01/24/2018   Tetraparesis (HCC) 01/20/2018   Neurogenic bladder 01/20/2018   Reactive depression    Benign essential HTN    Acute postoperative anemia due to expected blood loss    Central cord syndrome at C6 level of cervical spinal cord (HCC) 11/29/2017   Allergy to alpha-gal 11/25/2016   SCC (squamous cell carcinoma) 04/08/2014   ONSET DATE: 11/29/2017  REFERRING DIAG: Central Cord Syndrome at C6 of the Cervical Spinal Cord, Fall with Bilateral Closed Hip Fractures, with ORIF repair with Intramedullary Nailing of the Left Hip Fracture.   THERAPY DIAG:  Muscle weakness (generalized)  Other lack of coordination  Rationale for Evaluation and Treatment: Rehabilitation  SUBJECTIVE:  SUBJECTIVE STATEMENT:  Pt. Reports that she has an appointment for an Echocardiogram this afternoon.  Pt accompanied by: self, Personal Care aide  PERTINENT HISTORY: Central Cord Syndrome at C6 of the Cervical Spinal Cord, Fall with Bilateral  Closed Hip Fractures, with ORIF repair with Intramedullary Nailing of the Left Hip Fracture.   PRECAUTIONS: None  WEIGHT BEARING RESTRICTIONS: No  PAIN:  Are you having pain? No pain   LIVING ENVIRONMENT: Lives with: [facility] Stairs: No Has following equipment at home: Wheelchair (power)  PLOF: Independent   PATIENT GOALS:  See below for established goals  OBJECTIVE:  Note: Objective  measures were completed at Evaluation unless otherwise noted.  HAND DOMINANCE: Right  ADLs: Caregiver assist with ADLs, and [facility]  MOBILITY STATUS: Uses a power w/c  ACTIVITY TOLERANCE: Activity tolerance: Fair  FUNCTIONAL OUTCOME MEASURES:  Measurements:   12/09/2021:   Shoulder flexion: R: 120(136), L: 138(150) Shoulder abduction: R: 108(120), L: 110(120) Elbow: R: 0-148, L: 0-146 Wrist flexion: R: 55(62), L: 78 Writ extension: R: 34(55), L: 3(4) RD: R: 12(20), L: 16(26) UD: R: 8(24), L: 6(18) Thumb radial abduction: R: 11(20), L: 8(20) Digit flexion to the [facility]: R:  2nd: 5cm(3.5cm), 3rd: 7.5 cm(5cm), 4th: 7cm(3cm), 5th: 6cm(5.5cm) L: 2nd: 8cm(8cm), 3rd: 8cm(8cm), 4th: 7.5cm(7cm), 5th: 7cm(7cm)   02/03/2022:   Shoulder flexion: R: 122(138), L: 148(154) Shoulder abduction: R: 109(125), L: 125(125 Elbow: R: 0-148, L: 0-146 Wrist flexion: R: 60(68), L: 79 Writ extension: R: 40(55), L: 3(10) RD: R: 14(24), L: 20(28) UD: R: 8(24), L: 6(18) Thumb radial abduction: R: 20(25), L: 8(20) Digit flexion to the [name]: R:  2nd: 4.5cm(3cm), 3rd: 6.5cm(4.5cm), 4th: 6.5 cm(3cm), 5th: 4.5cm(5cm) L: 2nd: 8cm(8cm), 3rd: 8cm(7cm), 4th: 7.5cm(7cm), 5th: 6.5cm(6.5cm)   04/07/2022:   Shoulder flexion: R: 125(150), L: 160(160) Shoulder abduction: R: 109(125), L: 125(125 Elbow: R: 0-148, L: 0-146 Wrist flexion: R: 60(68), L: 79 Writ extension: R: 40(55), L: 5(18) RD: R: 14(24), L: 20(28) UD: R: 18(24), L: 8(18) Thumb radial abduction: R: 20(25), L: 8(20) Digit flexion to the [facility]: R:  2nd: 4.5 cm(4 cm), 3rd: 6.5cm(3cm), 4th: 7 cm (5cm), 5th: 5cm(5cm) L: 2nd: 8cm(8cm), 3rd: 8cm(7cm), 4th: 7.5cm(7cm), 5th: 7cm(7cm)   07/05/2022:   Shoulder flexion: R: 125(154), L: 160(160) Shoulder abduction: R: 95(130), L: 143(143) Elbow: R: 0-148, L: 0-146 Wrist flexion: R: 60(68), L: 79 Writ extension: R: 40(60), L: 0(12) RD: R: 10(24), L: 12(20) UD: R: 16(24), L: 8(16) Thumb radial  abduction: R: 20(25), L: 8(20) Digit flexion to the [facility]: R:  2nd: 4 cm(4cm), 3rd: 6.5cm( 3.5cm), 4th: 7 cm (5cm), 5th: 6cm(5cm) L: 2nd: 8cm(8cm), 3rd: 8cm(7cm), 4th: 8cm(7cm), 5th: 7cm(7cm)   09/13/2022:   Shoulder flexion: R: 125(154), L: 160(160) Shoulder abduction: R: 100(130), L: 143(143) Elbow: R: 0-150, L: 0-150 Wrist flexion: R: 55(68), L: 79 Writ extension: R: 45(60), L: 0(12) RD: R: 10(24), L: 12(20) UD: R: 16(24), L: 8(16) Thumb radial abduction: R: 20(25), L: 8(20) Digit flexion to the [facility]: R:  2nd: 4 cm(4cm), 3rd: 6cm( 5cm), 4th: 6 cm (4cm), 5th: 5cm(5cm) L: 2nd: 7cm(cm), 3rd: 7cm(6cm), 4th: 7cm(6cm), 5th: 6cm(6cm)   10/27/2022   Shoulder flexion: R: 128(154), L: 160(160) Shoulder abduction: R: 105(130), L: 143(143) Elbow: R: 0-150, L: 0-150 Wrist flexion: R: 55(68), L: 79 Writ extension: R: 46(64), L: 0(20) RD: R: 10(20), L: 18(24) UD: R: 18(28), L: 8(20) Thumb radial abduction: R: 30(42), L:20(24) Digit flexion to the [facility]: R:  2nd: 4 cm(3cm), 3rd: 6cm( 6cm), 4th: 6 cm (5cm), 5th: 5cm(5cm) L: 2nd: 8cm(8cm), 3rd: 8cm(6cm), 4th: 8cm(7cm), 5th: 6cm(6cm)  01/19/2023  Shoulder flexion: R: 131(154), L: 160(160) Shoulder abduction: R: 110(130), L: 143(143) Elbow: R: 0-150, L: 0-150  Wrist flexion: R: 55(68), L: 79 Writ extension: R: 46(64), L: 0(20) RD: R: 10(20), L: 18(24) UD: R: 18(28), L: 14(20) Thumb radial abduction: R: 38(48), L:24(26) Thumb IP flexion: R: 35(50) L: 20(45) Digit flexion to the [facility]: R:  2nd: 3.5 cm(3cm), 3rd: 6cm( 6cm), 4th: 6 cm (5cm), 5th: 5cm(4.5cm) L: 2nd: 8cm(7.5cm), 3rd: 8cm(6cm), 4th: 7.5cm(6cm), 5th: 6cm(4.5cm)  04/04/2023  Shoulder flexion: R: 134(154), L: 160(160) Shoulder abduction: R: 114(130), L: 143(143) Elbow: R: WNL, L: WNL Wrist flexion: R: 55(68), L: 80 Writ extension: R: 46(64), L: -10(10) RD: R: 14(24), L: 20(24) UD: R: 20(30), L: 14(20) Thumb radial abduction: R: 38(48), L:28(30) Thumb IP flexion: R: 40(50) L:  25(45) Digit flexion to the [facility]: R:  2nd: 3.5 cm(3cm), 3rd: 6cm( 5cm), 4th: 6 cm (4cm), 5th: 5cm(5cm) L: 2nd: 8cm(7.5cm), 3rd: 8cm(7cm), 4th: 7.5cm(7cm), 5th: 7cm(6cm)  05/25/2023  Shoulder flexion: R: 134(154), L: 160(160) Shoulder abduction: R: 117(134), L: 143(143) Elbow: R: WNL, L: WNL Wrist flexion: R: 55(68), L: 80 Wrist extension: R: 46(64), L: -8(14) RD: R: 14(24), L: 20(24) UD: R: 20(32), L: 14(22) Thumb radial abduction: R: 38(50), L:28(30) Thumb IP flexion: R: 40(50) L: 30(45) Digit flexion to the [facility]: R:  2nd: 3.5 cm(3cm), 3rd: 6cm( 5cm), 4th: 7 cm (4cm), 5th: 5cm(4cm) L: 2nd: 8cm(7.5cm), 3rd: 8cm(7cm), 4th: 7.5cm(7cm), 5th: 7cm(6cm)  07/13/23  Shoulder flexion: R: 134(154), L: 160(160) Shoulder abduction: R: 122(134), L: 143(143) Elbow: R: WNL, L: WNL Wrist flexion: R: 55(68), L: 80 Wrist extension: R: 50(64), L: -4(18) RD: R: 18(24), L: 24(24) UD: R: 20(32), L: 14(22) Thumb radial abduction: R: 38(50), L:3230) Thumb IP flexion: R: 42(50) L: 30(45) Digit flexion to the [facility]: R:  2nd: 3.5 cm(3cm), 3rd: 6cm( 5cm), 4th: 7 cm (4cm), 5th: 5cm(4cm) L: 2nd: 8cm(7.5cm), 3rd: 8cm(7cm), 4th: 7.5cm(7cm), 5th: 7cm(6cm)   COORDINATION:   01/19/2023:  9 hole peg test:  Right: 2 pegs placed in 1 min. & 5 sec.  Pt. was able to remove 9 pegs in 17 sec.  Left: 2 pegs placed in 1 min. & 10 sec. Pt. was able to remove 9 pegs in 20 sec.  04/04/23:  Pt. was able to consistently grasp the pegs however each one slipped out of her fingers when attempting to place them into the Pegboard.  05/25/2023:   Right: 9 pegs placed and removed in 2 min. & 41 sec. Left: Unable to grasp the pegs for a horizontal position.  07/13/23    Right: 9 pegs placed and removed in 2 min. & 11 sec.   SENSATION: Intact  EDEMA: N/A   COGNITION: Overall cognitive status: WNL   VISION: Subjective report: Wears glasses, No changes in vision.  PERCEPTION: Intact  TODAY'S TREATMENT:                                                                                                                               DATE:  09/07/2023  Moist heat modality the the bilateral shoulder and hands for 6 min.   There. Ex.:  -Pt. tolerated PROM bilateral wrist extension with increased focus on the left wrist extension. PROM for bilateral digit MP, PIP, and DIP flexion, and extension, thumb abduction was performed per Pt. Request.  See Clinical Impression regarding additional treatment session information.        PATIENT EDUCATION: Education details: Bilateral hand  ROM, positioning Person educated: Patient Education method: Explanation, Demonstration, and Verbal cues Education comprehension: verbalized understanding and returned demonstration  HOME EXERCISE PROGRAM: Continue to assess HEP needs, and provide as needed.   GOALS: Goals reviewed with patient? Yes  LONG TERM GOALS: Target date: 10/05/2023  3.  Patient will independently use a reacher to pick up items from various surfaces.  Baseline: 07/13/2023: Pt. Is starting to use a reacher to retrieve items with an adaptation/modification to the trigger.05/25/2023:  Pt. is now able to initiate grasping a reacher to open and close the reacher with the right hand, however requires the grasp handle to be repositioned  within the hand. Pt. requires work on using/moving the reacher with the right hand through various planes. 04/04/2023: Pt. continues to work on improving Digit flexion to the [facility] to be able to efficiently handle Adaptive equipment. 01/19/2023: Pt. Is improving with Bilateral digit flexion to the [facility]. Pt. has difficulty firmly holding adaptive devices. 12/06/2022: Pt. continues to present with increased MP, PIP, and DIP digit tightness/stiffness limiting the formulation of bilateral composites fists. 10/27/2022: Pt. presents with increased MP, PIP, and DIP digit tightness/stiffness limiting the formulation of bilateral composites fists  during this progress reporting period limiting. Digit flexion to the [facility]: R:  2nd: 4 cm(3cm), 3rd: 6cm( 6cm), 4th: 6 cm (5cm), 5th: 5cm(5cm), L: 2nd: 8cm(8cm), 3rd: 8cm(6cm), 4th: 8cm(7cm), 5th: 6cm(6cm) 09/20/2022: Improving digit flexion to the [facility]. 09/13/22: improved digit flexion to the [facility]. 07/05/2022: Pt. presents with digit MP, PIP, and DIP extensor tightness limiting her ability to securely grip objects in her bilateral hands.  04/07/2022: Pt. Has improved with right 2nd, and 3rd digit flexion towards the [facility]. Pt. Continues to have difficulty securely holding and applying deodorant.02/03/2022: Pt. Has improved with digit flexion, however, continues to have difficulty securely holding and using deodorant. 12/09/2021: Bilateral hand/digit MP, PIP, and DIP extension tightness limits her ability achieve digit flexion to hold, and apply deodorant. 10/28/2021:  Pt. continues to have difficulty holding the deodorant. 01/12/2021: Pt. Presents with limited digit extension. Pt. Is able to initiate holding deodorant, however is unable to hold it while using it  Goal status: Ongoing   4.  Pt. Will improve bilateral wrist extension in preparation for anticipating, and initiating reaching for objects at the table.  Baseline: 07/13/23:  Wrist extension: R: 50(64), L: -4(18) 05/25/23: Wrist extension: R: 46(64), L: -8(14) 04/04/23: R: 46(64) L: -10(10)01/19/2023:  R: 46(64) L: 0(20) 12/06/2022: Pt continues to to progress with bilateral wrist extension in preparation fro functional reaching. 10/27/2022: Pt. Is improving with bilateral wrist extension. R: 46(64) L: 0(20) 09/20/2022: limited PROM in the Left wrist extension 2/2 tightness/stiffness. 09/13/22: R: 55(68), L: 0(12) 07/19/2022: Bilateral wrist extension in limited. 07/05/2022: Right: 40(60) Left: 0(12) 04/07/2022: Right: 40(55) Left: 5(18) 02/03/2022: Right: 34(55) Left: 3(4) 12/09/2021: Right  34(55)  Left  3(4) 10/28/2021:  Right 22(38), Left 0(15) 09/14/2021:  Right: 17(35),  left 2(15)  Goal status: Progressing/Ongoing  7.  Pt. Will increase bilateral lateral pinch strength by 2 lbs to be able to securely  grasp items during ADLs, and IADL tasks.  Baseline: 01/19/2023: Deferred as the Pinch meter is out for calibration.12/06/2022: Pt. Is able to more securely hold objects during ADLs/IADLs 10/27/2022: Pt. Is improving with holding items with her bilateral thumbs. (Pinch meter out for calibration) 09/20/2022: lateral pinch continues to be limited 09/13/22: R 6.5# L 4# 07/19/2022: TBD 07/05/2022: TBD 07/2022: NT-Pinch meter out for calibration. 02/03/2022: Right: 6#, Left: 4# 12/09/2021: Right: 6#, Left: 4#  Goal status: Deferred   9.  Pt. will complete plant care with modified independence.  Baseline:12/06/2022: Pt. Is independent with plant care in her husband's room, however is not able to access her plants due to furniture blocking access to them. 10/27/2022: Pt. Is able to water plants that are closer, and within reach. Pt. continues to have difficulty reaching for thorough plant care. 09/20/2022: Pt. Continues to have difficulty reaching for thorough plant care. 09/13/22: continues to report intermittent difficulty 07/19/2022: Pt. Continues to be able to water, and care for some of her plants. Pt. Has more difficulty with plants that are harder to reach. 07/05/2022: pt. Is able to water, and care for some of her plants. Pt. Has more difficulty with plants that are harder to reach. 04/07/2022: Pt. is now able to hold a cup and water her plants.02/03/2022: Pt. has difficulty caring for her plants.  Goal status:Achieved   10.  Pt. will demonstrate adaptive techniques to assist with the efficiency of self-dressing, or morning care tasks.  Baseline: 07/13/23:Pt. continues to work on improving UE functioning to be able to efficiently use Adaptive equipment 05/25/2023: Pt. continues to work on improving UE functioning to be able to efficiently use Adaptive equipment.  04/04/2023:  Pt. continues  to work on improving UE functioning to be able to efficiently use Adaptive equipment. 01/19/2203: Pt. Is able to donn her jacket in reverse independently. Pt. requires assist from staff, as Pt. Reports staff have decreased time. 12/06/2022: Pt. requires assist from staff, as Pt. Reports staff have decreased time. 10/27/2022: Pt. is able to assist with initiating UE dressing. Pt. requires assist from personal, and staff care aides. 09/20/2022: Pt. continues to require assist form personal, and staff care aides. 09/13/22: MODA dressing, reports she is often rushed 07/19/2022: Pt. continues to require assist with self-dressing/morning care tasks. 07/05/2022: Continue with goal. 04/07/2022: Pt. Continues to require assist with the efficiency of self-dressing, and morning care tasks.02/03/2022: Pt. requires assist from caregivers 2/2 time limitations during morning care.  Goal status: Ongoing  11.  Pt. will improve BUE strength to improve ADL, and IADL functioning.  Baseline: 07/13/23: shoulder flexion L 4/5, R 4-/5; shoulder abduction L 4+/5, R 4-/5. elbow flexion B 5/5, elbow extension 4+/5 01/19/2023: L 4/5, R 3+/5  05/25/2023: shoulder flexion L 4/5, R 4-/5; shoulder abduction L 4+/5, R 4-/5. elbow flexion B 5/5, elbow extension 4+/5 01/19/2023: L 4/5, R 3+/5  04/04/2023: shoulder flexion L 4/5, R 3+/5; shoulder abduction L 4+/5, R 3+/5. elbow flexion B 5/5, elbow extension 4/5 01/19/2023: L 4/5, R 3+/5 12/06/2022: Continue 10/27/2022: shoulder flexion L 4/5, R 3+/5; shoulder abduction L 4+/5, R 3+/5. elbow flexion B 5/5, elbow extension 4/5 09/20/2022: Pt. Presents with limited BUE strength 09/13/22: shoulder flexion L 4/5, R 3/5; elbow flexion B 5/5, elbow extension 4-/5; shoulder abduction L 4+/5, R 3+/5. 07/19/2022: BUE strength continues to be limited. 07/05/2022: shoulder flexion: right 4-/5, abduction: 3+/5, elbow flexion: right: 5/5, left 5/5, extension: right: 4-/5, left 4-/5, wrist extension: right: 3-/5, left: 2-/5  Goal status: Ongoing  12.  Pt. will improve bilateral thumb radial abduction in order to be able to hold the grab bars while standing with PT  Baseline: 07/13/23: Pt. Is improving with holding the grab bars with the right hand .05/25/23: Thumb radial abduction: R: 38(50), L:28(30) 04/04/23: R: 38(48), L:28(30) 01/19/2023: R: 38(48) L: 24(26)12/06/2022: Pt. Continues to present  with limited thumb abduction, however is improving holding onto the parallel bars.10/27/2022: thumb radial abduction: R: 30(42), L: 20(24  Goal status: Ongoing  13.  Pt. Will be able to securely hold, and stabilize medication bottles at a tabletop surface when opening, and closing them.  Baseline: 01/19/2023: Pt. Is now able to securely hold medication bottles while opening them, and stabilizes them on surfaces while opening them. 12/06/2022: Pt is improving with securely stabilizing medication bottles. 10/27/2022: Pt. Stabilizes bottles against her torso when attempting to open, and close them   Goal status: Achieved  14: Pt. Will improve bilateral thumb IP flexion to improve active grasping patterns.    Baseline: 07/13/23: Thumb IP flexion: R: 42(50) L: 30(45) 05/25/2023: Thumb IP flexion: R: 40(50) L: 30(45) 04/04/2023:  R: 40(50) L: 25(45) 01/19/2023: Right 35(50), Left: 20/45)    Goal status: Ongoing    15. Pt. Will improve bilateral FMC/speed, and dexterity. As evidence by improved scores on the 9 hole peg test.  Baseline: 07/13/2023: Right: 9 pegs placed and removed in 2 min. & 11 sec. 05/25/2023: 9 Hole pegs test speed/dexterity: Right: 9 pegs placed and removed in 2 min. & 41 sec. Left: Unable to grasp the pegs for a horizontal position. 04/04/23: Pt. was able to consistently grasp the pegs however each one slipped out of her fingers when attempting to place them into the Pegboard. 01/19/2023: 2 pegs placed in 1 min. & 5 sec.  Pt. was able to remove 9 pegs in 17 sec.; Left: 2 pegs placed in 1 min. & 10 sec. Pt. was able to  remove 9 pegs in 20 sec.   Goal status: Ongoing  ASSESSMENT:  CLINICAL IMPRESSION:  Upon arrival, Pt. requested having her BP taken 2/2 having low BP symptoms. BP: 88/59 , HR 73.  Pt. was assisted to a reclined position. Pt./caregiver called her [facility] for guidance. Pt.'s personal caregiver provided her with medication to help elevate her BP.  Pt. finished her Gatorade. Pt.'s BP was reassessed 94/57. Pt. Reported feeling better, and wanting to participate  in the remainder of the session, however requested to focus on wrist, hand, and digit MP, PIP, and DIP stretches today. Pt. continues to benefit from OT services to work on impoving ROM, and UE strength in order to work towards increasing bilateral hand grasp on objects, and increasing engagement of bilateral hands during ADLs, and IADL tasks.      PERFORMANCE DE PERFORMANCE DEFICITS: in functional skills including ADLs, IADLs, coordination, dexterity, ROM, strength, and UE functional use, cognitive skills including , and psychosocial skills including coping strategies and environmental adaptation.   IMPAIRMENTS: are limiting patient from ADLs, IADLs, and leisure.   CO-MORBIDITIES: may have co-morbidities  that affects occupational performance. Patient will benefit from skilled OT to address above impairments and improve overall function.  MODIFICATION OR ASSISTANCE TO COMPLETE EVALUATION: Min-Moderate modification of tasks or assist with assess necessary to complete an evaluation.  OT OCCUPATIONAL PROFILE AND HISTORY: Detailed assessment: Review of records and additional review of physical, cognitive, psychosocial history related to current functional performance.  CLINICAL DECISION MAKING: Moderate -  several treatment options, min-mod task modification necessary  REHAB POTENTIAL: Good for stated goals  PLAN:  OT FREQUENCY 2x's a week  OT DURATION: 12 weeks  PLANNED INTERVENTIONS ADL training, A/E training, UE ther.  Ex, Manual therapy, neuromuscular re-education, moist heat modality, Paraffin Bath, Splinting, and  Pt./caregiver education   RECOMMENDED OTHER SERVICES: PT  CONSULTED AND AGREED WITH PLAN OF CARE: Patient  PLAN FOR NEXT SESSION: Continue Treatment as per established POC  Richardson Otter, MS, OTR/L   09/07/23, 5:11 PM

## 2023-09-12 ENCOUNTER — Ambulatory Visit

## 2023-09-12 ENCOUNTER — Ambulatory Visit: Admitting: Occupational Therapy

## 2023-09-12 NOTE — Therapy (Incomplete)
 Occupational Therapy Treatment Note  Patient Name: Sherry [name] MRN: [id number] DOB:03-12-1934, 89 y.o., female Today's Date: 11/29/2022  PCP:  [facility] PROVIDER: Abdul Fine, MD  END OF SESSION:                   Past Medical History:  Diagnosis Date   Acute blood loss anemia    Arthritis    Cancer (HCC)    skin   Central cord syndrome at C6 level of cervical spinal cord (HCC) 11/29/2017   Hypertension    Protein-calorie malnutrition, severe (HCC) 01/24/2018   S/P insertion of IVC (inferior vena caval) filter 01/24/2018   Tetraparesis (HCC)    Past Surgical History:  Procedure Laterality Date   ANTERIOR CERVICAL DECOMP/DISCECTOMY FUSION N/A 11/29/2017   Procedure: Cervical five-six, six-seven Anterior Cervical Decompression Fusion;  Surgeon: Cheryle Debby DELENA, MD;  Location: [facility] OR;  Service: Neurosurgery;  Laterality: N/A;  Cervical five-six, six-seven Anterior Cervical Decompression Fusion   CATARACT EXTRACTION     FEMUR IM NAIL Left 08/16/2021   Procedure: INTRAMEDULLARY (IM) NAIL FEMORAL;  Surgeon: Cleotilde Barrio, MD;  Location: [facility] ORS;  Service: Orthopedics;  Laterality: Left;   IR IVC FILTER PLMT / S&I /IMG GUID/MOD SED  12/08/2017   Patient Active Problem List   Diagnosis Date Noted   Age-related osteoporosis without current pathological fracture 07/27/2022   Mild recurrent major depression (HCC) 07/27/2022   Hypertensive heart disease with other congestive heart failure (HCC) 02/14/2022   Chronic indwelling Foley catheter 02/14/2022   Closed hip fracture (HCC) 08/15/2021   DVT (deep venous thrombosis) (HCC) 08/15/2021   Quadriplegia (HCC) 08/15/2021   Fall 08/15/2021   Constipation due to slow transit 08/31/2018   Trauma 06/05/2018   Neuropathic pain 06/05/2018   Neurogenic bowel 06/05/2018   Vaginal yeast infection 01/30/2018   Healthcare-associated pneumonia 01/25/2018   Chronic allergic rhinitis 01/24/2018   Depression with anxiety  01/24/2018   UTI due to Klebsiella species 01/24/2018   Protein-calorie malnutrition, severe (HCC) 01/24/2018   S/P insertion of IVC (inferior vena caval) filter 01/24/2018   Tetraparesis (HCC) 01/20/2018   Neurogenic bladder 01/20/2018   Reactive depression    Benign essential HTN    Acute postoperative anemia due to expected blood loss    Central cord syndrome at C6 level of cervical spinal cord (HCC) 11/29/2017   Allergy to alpha-gal 11/25/2016   SCC (squamous cell carcinoma) 04/08/2014   ONSET DATE: 11/29/2017  REFERRING DIAG: Central Cord Syndrome at C6 of the Cervical Spinal Cord, Fall with Bilateral Closed Hip Fractures, with ORIF repair with Intramedullary Nailing of the Left Hip Fracture.   THERAPY DIAG:  Muscle weakness (generalized)  Other lack of coordination  Rationale for Evaluation and Treatment: Rehabilitation  SUBJECTIVE:  SUBJECTIVE STATEMENT:  Pt. Reports that she has an appointment for an Echocardiogram this afternoon.  Pt accompanied by: self, Personal Care aide  PERTINENT HISTORY: Central Cord Syndrome at C6 of the Cervical Spinal Cord, Fall with Bilateral Closed Hip Fractures, with ORIF repair with Intramedullary Nailing of the Left Hip Fracture.   PRECAUTIONS: None  WEIGHT BEARING RESTRICTIONS: No  PAIN:  Are you having pain? No pain   LIVING ENVIRONMENT: Lives with: [facility] Stairs: No Has following equipment at home: Wheelchair (power)  PLOF: Independent   PATIENT GOALS:  See below for established goals  OBJECTIVE:  Note: Objective measures were completed at Evaluation unless otherwise noted.  HAND DOMINANCE: Right  ADLs: Caregiver assist with  ADLs, and [facility]  MOBILITY STATUS: Uses a power w/c  ACTIVITY TOLERANCE: Activity tolerance: Fair  FUNCTIONAL OUTCOME MEASURES:  Measurements:   12/09/2021:   Shoulder flexion: R: 120(136), L: 138(150) Shoulder abduction: R: 108(120), L: 110(120) Elbow:  R: 0-148, L: 0-146 Wrist flexion: R: 55(62), L: 78 Writ extension: R: 34(55), L: 3(4) RD: R: 12(20), L: 16(26) UD: R: 8(24), L: 6(18) Thumb radial abduction: R: 11(20), L: 8(20) Digit flexion to the [facility]: R:  2nd: 5cm(3.5cm), 3rd: 7.5 cm(5cm), 4th: 7cm(3cm), 5th: 6cm(5.5cm) L: 2nd: 8cm(8cm), 3rd: 8cm(8cm), 4th: 7.5cm(7cm), 5th: 7cm(7cm)   02/03/2022:   Shoulder flexion: R: 122(138), L: 148(154) Shoulder abduction: R: 109(125), L: 125(125 Elbow: R: 0-148, L: 0-146 Wrist flexion: R: 60(68), L: 79 Writ extension: R: 40(55), L: 3(10) RD: R: 14(24), L: 20(28) UD: R: 8(24), L: 6(18) Thumb radial abduction: R: 20(25), L: 8(20) Digit flexion to the [facility]: R:  2nd: 4.5cm(3cm), 3rd: 6.5cm(4.5cm), 4th: 6.5 cm(3cm), 5th: 4.5cm(5cm) L: 2nd: 8cm(8cm), 3rd: 8cm(7cm), 4th: 7.5cm(7cm), 5th: 6.5cm(6.5cm)   04/07/2022:   Shoulder flexion: R: 125(150), L: 160(160) Shoulder abduction: R: 109(125), L: 125(125 Elbow: R: 0-148, L: 0-146 Wrist flexion: R: 60(68), L: 79 Writ extension: R: 40(55), L: 5(18) RD: R: 14(24), L: 20(28) UD: R: 18(24), L: 8(18) Thumb radial abduction: R: 20(25), L: 8(20) Digit flexion to the [facility]: R:  2nd: 4.5 cm(4 cm), 3rd: 6.5cm(3cm), 4th: 7 cm (5cm), 5th: 5cm(5cm) L: 2nd: 8cm(8cm), 3rd: 8cm(7cm), 4th: 7.5cm(7cm), 5th: 7cm(7cm)   07/05/2022:   Shoulder flexion: R: 125(154), L: 160(160) Shoulder abduction: R: 95(130), L: 143(143) Elbow: R: 0-148, L: 0-146 Wrist flexion: R: 60(68), L: 79 Writ extension: R: 40(60), L: 0(12) RD: R: 10(24), L: 12(20) UD: R: 16(24), L: 8(16) Thumb radial abduction: R: 20(25), L: 8(20) Digit flexion to the [facility]: R:  2nd: 4 cm(4cm), 3rd: 6.5cm( 3.5cm), 4th: 7 cm (5cm), 5th: 6cm(5cm) L: 2nd: 8cm(8cm), 3rd: 8cm(7cm), 4th: 8cm(7cm), 5th: 7cm(7cm)   09/13/2022:   Shoulder flexion: R: 125(154), L: 160(160) Shoulder abduction: R: 100(130), L: 143(143) Elbow: R: 0-150, L: 0-150 Wrist flexion: R: 55(68), L: 79 Writ extension: R: 45(60), L:  0(12) RD: R: 10(24), L: 12(20) UD: R: 16(24), L: 8(16) Thumb radial abduction: R: 20(25), L: 8(20) Digit flexion to the [facility]: R:  2nd: 4 cm(4cm), 3rd: 6cm( 5cm), 4th: 6 cm (4cm), 5th: 5cm(5cm) L: 2nd: 7cm(cm), 3rd: 7cm(6cm), 4th: 7cm(6cm), 5th: 6cm(6cm)   10/27/2022   Shoulder flexion: R: 128(154), L: 160(160) Shoulder abduction: R: 105(130), L: 143(143) Elbow: R: 0-150, L: 0-150 Wrist flexion: R: 55(68), L: 79 Writ extension: R: 46(64), L: 0(20) RD: R: 10(20), L: 18(24) UD: R: 18(28), L: 8(20) Thumb radial abduction: R: 30(42), L:20(24) Digit flexion to the [facility]: R:  2nd: 4 cm(3cm), 3rd: 6cm( 6cm), 4th: 6 cm (5cm), 5th: 5cm(5cm) L: 2nd: 8cm(8cm), 3rd: 8cm(6cm), 4th: 8cm(7cm), 5th: 6cm(6cm)  01/19/2023  Shoulder flexion: R: 131(154), L: 160(160) Shoulder abduction: R: 110(130), L: 143(143) Elbow: R: 0-150, L: 0-150 Wrist flexion: R: 55(68), L: 79 Writ extension: R: 46(64), L: 0(20) RD: R: 10(20), L: 18(24) UD: R: 18(28), L: 14(20) Thumb radial abduction: R: 38(48), L:24(26) Thumb IP flexion: R: 35(50) L: 20(45) Digit flexion to the [facility]: R:  2nd: 3.5 cm(3cm), 3rd: 6cm( 6cm), 4th: 6 cm (5cm), 5th: 5cm(4.5cm) L: 2nd: 8cm(7.5cm), 3rd: 8cm(6cm), 4th: 7.5cm(6cm), 5th: 6cm(4.5cm)  04/04/2023  Shoulder flexion: R: 134(154), L: 160(160) Shoulder abduction: R: 114(130), L: 143(143) Elbow: R: WNL, L: WNL Wrist flexion: R: 55(68),  L: 80 Writ extension: R: 46(64), L: -10(10) RD: R: 14(24), L: 20(24) UD: R: 20(30), L: 14(20) Thumb radial abduction: R: 38(48), L:28(30) Thumb IP flexion: R: 40(50) L: 25(45) Digit flexion to the [facility]: R:  2nd: 3.5 cm(3cm), 3rd: 6cm( 5cm), 4th: 6 cm (4cm), 5th: 5cm(5cm) L: 2nd: 8cm(7.5cm), 3rd: 8cm(7cm), 4th: 7.5cm(7cm), 5th: 7cm(6cm)  05/25/2023  Shoulder flexion: R: 134(154), L: 160(160) Shoulder abduction: R: 117(134), L: 143(143) Elbow: R: WNL, L: WNL Wrist flexion: R: 55(68), L: 80 Wrist extension: R: 46(64), L: -8(14) RD: R: 14(24), L:  20(24) UD: R: 20(32), L: 14(22) Thumb radial abduction: R: 38(50), L:28(30) Thumb IP flexion: R: 40(50) L: 30(45) Digit flexion to the [facility]: R:  2nd: 3.5 cm(3cm), 3rd: 6cm( 5cm), 4th: 7 cm (4cm), 5th: 5cm(4cm) L: 2nd: 8cm(7.5cm), 3rd: 8cm(7cm), 4th: 7.5cm(7cm), 5th: 7cm(6cm)  07/13/23  Shoulder flexion: R: 134(154), L: 160(160) Shoulder abduction: R: 122(134), L: 143(143) Elbow: R: WNL, L: WNL Wrist flexion: R: 55(68), L: 80 Wrist extension: R: 50(64), L: -4(18) RD: R: 18(24), L: 24(24) UD: R: 20(32), L: 14(22) Thumb radial abduction: R: 38(50), L:3230) Thumb IP flexion: R: 42(50) L: 30(45) Digit flexion to the [facility]: R:  2nd: 3.5 cm(3cm), 3rd: 6cm( 5cm), 4th: 7 cm (4cm), 5th: 5cm(4cm) L: 2nd: 8cm(7.5cm), 3rd: 8cm(7cm), 4th: 7.5cm(7cm), 5th: 7cm(6cm)   COORDINATION:   01/19/2023:  9 hole peg test:  Right: 2 pegs placed in 1 min. & 5 sec.  Pt. was able to remove 9 pegs in 17 sec.  Left: 2 pegs placed in 1 min. & 10 sec. Pt. was able to remove 9 pegs in 20 sec.  04/04/23:  Pt. was able to consistently grasp the pegs however each one slipped out of her fingers when attempting to place them into the Pegboard.  05/25/2023:   Right: 9 pegs placed and removed in 2 min. & 41 sec. Left: Unable to grasp the pegs for a horizontal position.  07/13/23    Right: 9 pegs placed and removed in 2 min. & 11 sec.   SENSATION: Intact  EDEMA: N/A   COGNITION: Overall cognitive status: WNL   VISION: Subjective report: Wears glasses, No changes in vision.  PERCEPTION: Intact  TODAY'S TREATMENT:                                                                                                                              DATE:  09/07/2023     Moist heat modality the the bilateral shoulder and hands for 6 min.   There. Ex.:  -Pt. tolerated PROM bilateral wrist extension with increased focus on the left wrist extension. PROM for bilateral digit MP, PIP, and DIP flexion, and extension,  thumb abduction was performed per Pt. Request.  See Clinical Impression regarding additional treatment session information.        PATIENT EDUCATION: Education details: Bilateral hand  ROM, positioning Person educated: Patient Education method: Explanation, Demonstration, and Verbal cues Education comprehension:  verbalized understanding and returned demonstration  HOME EXERCISE PROGRAM: Continue to assess HEP needs, and provide as needed.   GOALS: Goals reviewed with patient? Yes  LONG TERM GOALS: Target date: 10/05/2023  3.  Patient will independently use a reacher to pick up items from various surfaces.  Baseline: 07/13/2023: Pt. Is starting to use a reacher to retrieve items with an adaptation/modification to the trigger.05/25/2023:  Pt. is now able to initiate grasping a reacher to open and close the reacher with the right hand, however requires the grasp handle to be repositioned  within the hand. Pt. requires work on using/moving the reacher with the right hand through various planes. 04/04/2023: Pt. continues to work on improving Digit flexion to the [facility] to be able to efficiently handle Adaptive equipment. 01/19/2023: Pt. Is improving with Bilateral digit flexion to the [facility] Pt. has difficulty firmly holding adaptive devices. 12/06/2022: Pt. continues to present with increased MP, PIP, and DIP digit tightness/stiffness limiting the formulation of bilateral composites fists. 10/27/2022: Pt. presents with increased MP, PIP, and DIP digit tightness/stiffness limiting the formulation of bilateral composites fists during this progress reporting period limiting. Digit flexion to the [facility]: R:  2nd: 4 cm(3cm), 3rd: 6cm( 6cm), 4th: 6 cm (5cm), 5th: 5cm(5cm), L: 2nd: 8cm(8cm), 3rd: 8cm(6cm), 4th: 8cm(7cm), 5th: 6cm(6cm) 09/20/2022: Improving digit flexion to the [facility]. 09/13/22: improved digit flexion to the [facility]. 07/05/2022: Pt. presents with digit MP, PIP, and DIP extensor tightness limiting her ability  to securely grip objects in her bilateral hands.  04/07/2022: Pt. Has improved with right 2nd, and 3rd digit flexion towards the [facility]. Pt. Continues to have difficulty securely holding and applying deodorant.02/03/2022: Pt. Has improved with digit flexion, however, continues to have difficulty securely holding and using deodorant. 12/09/2021: Bilateral hand/digit MP, PIP, and DIP extension tightness limits her ability achieve digit flexion to hold, and apply deodorant. 10/28/2021:  Pt. continues to have difficulty holding the deodorant. 01/12/2021: Pt. Presents with limited digit extension. Pt. Is able to initiate holding deodorant, however is unable to hold it while using it  Goal status: Ongoing   4.  Pt. Will improve bilateral wrist extension in preparation for anticipating, and initiating reaching for objects at the table.  Baseline: 07/13/23:  Wrist extension: R: 50(64), L: -4(18) 05/25/23: Wrist extension: R: 46(64), L: -8(14) 04/04/23: R: 46(64) L: -10(10)01/19/2023:  R: 46(64) L: 0(20) 12/06/2022: Pt continues to to progress with bilateral wrist extension in preparation fro functional reaching. 10/27/2022: Pt. Is improving with bilateral wrist extension. R: 46(64) L: 0(20) 09/20/2022: limited PROM in the Left wrist extension 2/2 tightness/stiffness. 09/13/22: R: 55(68), L: 0(12) 07/19/2022: Bilateral wrist extension in limited. 07/05/2022: Right: 40(60) Left: 0(12) 04/07/2022: Right: 40(55) Left: 5(18) 02/03/2022: Right: 34(55) Left: 3(4) 12/09/2021: Right  34(55)  Left  3(4) 10/28/2021:  Right 22(38), Left 0(15) 09/14/2021:  Right: 17(35), left 2(15)  Goal status: Progressing/Ongoing  7.  Pt. Will increase bilateral lateral pinch strength by 2 lbs to be able to securely grasp items during ADLs, and IADL tasks.  Baseline: 01/19/2023: Deferred as the Pinch meter is out for calibration.12/06/2022: Pt. Is able to more securely hold objects during ADLs/IADLs 10/27/2022: Pt. Is improving with holding items with her  bilateral thumbs. (Pinch meter out for calibration) 09/20/2022: lateral pinch continues to be limited 09/13/22: R 6.5# L 4# 07/19/2022: TBD 07/05/2022: TBD 07/2022: NT-Pinch meter out for calibration. 02/03/2022: Right: 6#, Left: 4# 12/09/2021: Right: 6#, Left: 4#  Goal status: Deferred   9.  Pt.  will complete plant care with modified independence.  Baseline:12/06/2022: Pt. Is independent with plant care in her husband's room, however is not able to access her plants due to furniture blocking access to them. 10/27/2022: Pt. Is able to water plants that are closer, and within reach. Pt. continues to have difficulty reaching for thorough plant care. 09/20/2022: Pt. Continues to have difficulty reaching for thorough plant care. 09/13/22: continues to report intermittent difficulty 07/19/2022: Pt. Continues to be able to water, and care for some of her plants. Pt. Has more difficulty with plants that are harder to reach. 07/05/2022: pt. Is able to water, and care for some of her plants. Pt. Has more difficulty with plants that are harder to reach. 04/07/2022: Pt. is now able to hold a cup and water her plants.02/03/2022: Pt. has difficulty caring for her plants.  Goal status:Achieved   10.  Pt. will demonstrate adaptive techniques to assist with the efficiency of self-dressing, or morning care tasks.  Baseline: 07/13/23:Pt. continues to work on improving UE functioning to be able to efficiently use Adaptive equipment 05/25/2023: Pt. continues to work on improving UE functioning to be able to efficiently use Adaptive equipment.  04/04/2023:  Pt. continues to work on improving UE functioning to be able to efficiently use Adaptive equipment. 01/19/2203: Pt. Is able to donn her jacket in reverse independently. Pt. requires assist from staff, as Pt. Reports staff have decreased time. 12/06/2022: Pt. requires assist from staff, as Pt. Reports staff have decreased time. 10/27/2022: Pt. is able to assist with initiating UE dressing.  Pt. requires assist from personal, and staff care aides. 09/20/2022: Pt. continues to require assist form personal, and staff care aides. 09/13/22: MODA dressing, reports she is often rushed 07/19/2022: Pt. continues to require assist with self-dressing/morning care tasks. 07/05/2022: Continue with goal. 04/07/2022: Pt. Continues to require assist with the efficiency of self-dressing, and morning care tasks.02/03/2022: Pt. requires assist from caregivers 2/2 time limitations during morning care.  Goal status: Ongoing  11.  Pt. will improve BUE strength to improve ADL, and IADL functioning.  Baseline: 07/13/23: shoulder flexion L 4/5, R 4-/5; shoulder abduction L 4+/5, R 4-/5. elbow flexion B 5/5, elbow extension 4+/5 01/19/2023: L 4/5, R 3+/5  05/25/2023: shoulder flexion L 4/5, R 4-/5; shoulder abduction L 4+/5, R 4-/5. elbow flexion B 5/5, elbow extension 4+/5 01/19/2023: L 4/5, R 3+/5  04/04/2023: shoulder flexion L 4/5, R 3+/5; shoulder abduction L 4+/5, R 3+/5. elbow flexion B 5/5, elbow extension 4/5 01/19/2023: L 4/5, R 3+/5 12/06/2022: Continue 10/27/2022: shoulder flexion L 4/5, R 3+/5; shoulder abduction L 4+/5, R 3+/5. elbow flexion B 5/5, elbow extension 4/5 09/20/2022: Pt. Presents with limited BUE strength 09/13/22: shoulder flexion L 4/5, R 3/5; elbow flexion B 5/5, elbow extension 4-/5; shoulder abduction L 4+/5, R 3+/5. 07/19/2022: BUE strength continues to be limited. 07/05/2022: shoulder flexion: right 4-/5, abduction: 3+/5, elbow flexion: right: 5/5, left 5/5, extension: right: 4-/5, left 4-/5, wrist extension: right: 3-/5, left: 2-/5  Goal status: Ongoing  12.  Pt. will improve bilateral thumb radial abduction in order to be able to hold the grab bars while standing with PT  Baseline: 07/13/23: Pt. Is improving with holding the grab bars with the right hand .05/25/23: Thumb radial abduction: R: 38(50), L:28(30) 04/04/23: R: 38(48), L:28(30) 01/19/2023: R: 38(48) L: 24(26)12/06/2022: Pt. Continues to  present  with limited thumb abduction, however is improving holding onto the parallel bars.10/27/2022: thumb radial abduction: R: 30(42), L: 20(24  Goal  status: Ongoing  13.  Pt. Will be able to securely hold, and stabilize medication bottles at a tabletop surface when opening, and closing them.  Baseline: 01/19/2023: Pt. Is now able to securely hold medication bottles while opening them, and stabilizes them on surfaces while opening them. 12/06/2022: Pt is improving with securely stabilizing medication bottles. 10/27/2022: Pt. Stabilizes bottles against her torso when attempting to open, and close them   Goal status: Achieved  14: Pt. Will improve bilateral thumb IP flexion to improve active grasping patterns.    Baseline: 07/13/23: Thumb IP flexion: R: 42(50) L: 30(45) 05/25/2023: Thumb IP flexion: R: 40(50) L: 30(45) 04/04/2023:  R: 40(50) L: 25(45) 01/19/2023: Right 35(50), Left: 20/45)    Goal status: Ongoing    15. Pt. Will improve bilateral FMC/speed, and dexterity. As evidence by improved scores on the 9 hole peg test.  Baseline: 07/13/2023: Right: 9 pegs placed and removed in 2 min. & 11 sec. 05/25/2023: 9 Hole pegs test speed/dexterity: Right: 9 pegs placed and removed in 2 min. & 41 sec. Left: Unable to grasp the pegs for a horizontal position. 04/04/23: Pt. was able to consistently grasp the pegs however each one slipped out of her fingers when attempting to place them into the Pegboard. 01/19/2023: 2 pegs placed in 1 min. & 5 sec.  Pt. was able to remove 9 pegs in 17 sec.; Left: 2 pegs placed in 1 min. & 10 sec. Pt. was able to remove 9 pegs in 20 sec.   Goal status: Ongoing  ASSESSMENT:  CLINICAL IMPRESSION:  Upon arrival, Pt. requested having her BP taken 2/2 having low BP symptoms. BP: 88/59 , HR 73.  Pt. was assisted to a reclined position. Pt./caregiver called her [facility] for guidance. Pt.'s personal caregiver provided her with medication to help elevate her BP.  Pt.  finished her Gatorade. Pt.'s BP was reassessed 94/57. Pt. Reported feeling better, and wanting to participate  in the remainder of the session, however requested to focus on wrist, hand, and digit MP, PIP, and DIP stretches today. Pt. continues to benefit from OT services to work on impoving ROM, and UE strength in order to work towards increasing bilateral hand grasp on objects, and increasing engagement of bilateral hands during ADLs, and IADL tasks.      PERFORMANCE DE PERFORMANCE DEFICITS: in functional skills including ADLs, IADLs, coordination, dexterity, ROM, strength, and UE functional use, cognitive skills including , and psychosocial skills including coping strategies and environmental adaptation.   IMPAIRMENTS: are limiting patient from ADLs, IADLs, and leisure.   CO-MORBIDITIES: may have co-morbidities  that affects occupational performance. Patient will benefit from skilled OT to address above impairments and improve overall function.  MODIFICATION OR ASSISTANCE TO COMPLETE EVALUATION: Min-Moderate modification of tasks or assist with assess necessary to complete an evaluation.  OT OCCUPATIONAL PROFILE AND HISTORY: Detailed assessment: Review of records and additional review of physical, cognitive, psychosocial history related to current functional performance.  CLINICAL DECISION MAKING: Moderate - several treatment options, min-mod task modification necessary  REHAB POTENTIAL: Good for stated goals  PLAN:  OT FREQUENCY 2x's a week  OT DURATION: 12 weeks  PLANNED INTERVENTIONS ADL training, A/E training, UE ther. Ex, Manual therapy, neuromuscular re-education, moist heat modality, Paraffin Bath, Splinting, and  Pt./caregiver education   RECOMMENDED OTHER SERVICES: PT  CONSULTED AND AGREED WITH PLAN OF CARE: Patient  PLAN FOR NEXT SESSION: Continue Treatment as per established POC  Elston Slot, M.S. OTR/L  09/12/23,  9:23 AM  ascom [phone]  09/12/23, 9:23 AM

## 2023-09-12 NOTE — Therapy (Incomplete)
 OUTPATIENT PHYSICAL THERAPY NEURO TREATMENT  Patient Name: Sherry [name] MRN: [id number] DOB:07-21-1934, 89 y.o., female Today's Date: 09/12/2023   PCP: Sherry Brigham, MD REFERRING PROVIDER: Cleotilde [name]                    Past Medical History:  Diagnosis Date   Acute blood loss anemia    Arthritis    Cancer (HCC)    skin   Central cord syndrome at C6 level of cervical spinal cord (HCC) 11/29/2017   Hypertension    Protein-calorie malnutrition, severe (HCC) 01/24/2018   S/P insertion of IVC (inferior vena caval) filter 01/24/2018   Tetraparesis (HCC)    Past Surgical History:  Procedure Laterality Date   ANTERIOR CERVICAL DECOMP/DISCECTOMY FUSION N/A 11/29/2017   Procedure: Cervical five-six, six-seven Anterior Cervical Decompression Fusion;  Surgeon: Sherry Debby DELENA, MD;  Location: [facility] OR;  Service: Neurosurgery;  Laterality: N/A;  Cervical five-six, six-seven Anterior Cervical Decompression Fusion   CATARACT EXTRACTION     FEMUR IM NAIL Left 08/16/2021   Procedure: INTRAMEDULLARY (IM) NAIL FEMORAL;  Surgeon: Sherry Barrio, MD;  Location: [facility] ORS;  Service: Orthopedics;  Laterality: Left;   IR IVC FILTER PLMT / S&I /IMG GUID/MOD SED  12/08/2017   Patient Active Problem List   Diagnosis Date Noted   Chronic pain syndrome 08/25/2023   Orthostatic hypotension 08/25/2023   Age-related osteoporosis without current pathological fracture 07/27/2022   Mild recurrent major depression (HCC) 07/27/2022   Hypertensive heart disease with other congestive heart failure (HCC) 02/14/2022   Chronic indwelling Foley catheter 02/14/2022   Closed hip fracture (HCC) 08/15/2021   DVT (deep venous thrombosis) (HCC) 08/15/2021   Quadriplegia (HCC) 08/15/2021   Fall 08/15/2021   Constipation due to slow transit 08/31/2018   Trauma 06/05/2018   Neuropathic pain 06/05/2018   Neurogenic bowel 06/05/2018   Vaginal yeast infection 01/30/2018   Healthcare-associated  pneumonia 01/25/2018   Chronic allergic rhinitis 01/24/2018   Depression with anxiety 01/24/2018   UTI due to Klebsiella species 01/24/2018   Protein-calorie malnutrition, severe (HCC) 01/24/2018   S/P insertion of IVC (inferior vena caval) filter 01/24/2018   Tetraparesis (HCC) 01/20/2018   Neurogenic bladder 01/20/2018   Reactive depression    Benign essential HTN    Acute postoperative anemia due to expected blood loss    Central cord syndrome at C6 level of cervical spinal cord (HCC) 11/29/2017   Allergy to alpha-gal 11/25/2016   SCC (squamous cell carcinoma) 04/08/2014    ONSET DATE: 08/15/2021 (fall with B hip fx); Initial injury was in 2019- quadraplegia due to central cord syndrome of C6 secondary to neck fracture.   REFERRING DIAG: Bilateral Hip fx; Left Open- s/p Left IM nail femoral on 08/16/2021; Right closed  THERAPY DIAG:  No diagnosis found.  Rationale for Evaluation and Treatment Rehabilitation  SUBJECTIVE:   SUBJECTIVE STATEMENT:   *** Patient reports having a good day so far- States has been seen by Cardiology and having more test this week.      Pt accompanied by: Paid caregiver and dtr-   PERTINENT HISTORY: Sherry [name] is an 87yoF who experienced a fall at home on 08/15/2021 with hip fracture, s/p Left hip ORIF. PMH: quadriplegia due to central cord syndrome of C6 secondary to neck fracture, HTN, depression with anxiety, neurogenic bladder, bilateral DVT on Xarelto  (s/p of IVC filter placement). Prior history of significant PT/OT with improvement of function. Pt has full use of shoulder/elbows, limited use of hands, adaptive  self feeding sucessful.     PAIN:  None current but does endorse some left thigh pain intermittent  OBJECTIVE:   TODAY'S TREATMENT:   09/05/23      Therex:  Passive quad stretching in seated- static hold x 2 min x 2 Active ham curl  2 x 10 reps each LE Active LAQ 2 x 15 reps each LE Eccentric LAQ x 5 each LE with 3 sec hold   Manually Resistive hip add- 2x 10 reps AAROM Hip abd 2 x 10 reps  Passive hamstring stretching hold 30 sec x 3 each LE   PATIENT EDUCATION: Education details: Exercise technique Patient verbalized understanding.   HOME EXERCISE PROGRAM: No changes at this time  Access Code: Sherry [name] URL: [email or web address] Date: 11/23/2021 Prepared by: Sherry [name] Exercises - Seated Gluteal Sets - 2 x daily - 7 x weekly - 2 sets - 10 reps - 5 hold - Seated Quad Set - 2 x daily - 7 x weekly - 2 sets - 10 reps - 5 hold - Seated Long Arc Quad - 2 x daily - 7 x weekly - 2 sets - 10 reps - 5 hold - Seated March - 2 x daily - 7 x weekly - 2 sets - 10 reps - 5 hold - Seated Hip Abduction - 2 x daily - 7 x weekly - 2 sets - 10 reps - 5 hold - Seated Shoulder Shrugs - 2 x daily - 7 x weekly - 2 sets - 10 reps - 5 hold - Wheelchair Pressure Relief - 2 x daily - 7 x weekly - 2 sets - 10 reps - 5 hold     GOALS: Goals reviewed with patient? Yes  SHORT TERM GOALS: Target date: 02/22/2022  Pt will be independent with initial UE strengthening HEP in order to improve strength and balance in order to decrease fall risk and improve function at home and work. Baseline: 10/19/2021- patient with no formal UE HEP; 12/02/2021= Patient verbalized knowledge of HEP including use of theraband for UE strengthening.  Goal status: GOAL MET  LONG TERM GOALS: Target date: 09/12/2023   Pt will be independent with final for UE/LE HEP in order to improve strength and balance in order to decrease fall risk and improve function at home and work. Baseline: Patient is currently BLE NWB and unable to participate in HEP. Has order for UE strengthening. 01/11/2022- Patient now able to participate in LE strengthening although NWB still- good understanding for some basic exercises. Will keep goal active to incorporate progressive LE strengthening exercises. 04/07/2022= Patient still participating in progressive LE seated HEP and has  no questions at this time.  Goal status: MET  2.  Pt will improve FOTO to target score of 35  to display perceived improvements in ability to complete ADL's.  Baseline: 10/19/2021= 12; 12/02/2021= 17; 01/11/2022=17; 04/07/2022= 17; 07/05/2022=17; 10/06/2022- outcome measure not appropriate as patient in non-ambulatory and not consistently standing. Goal status: Goal not appropriate  3.  Pt will increase strength of B UE  by at least 1/2 MMT grade in order to demonstrate improvement in strength and function  Baseline: patient has range of 2-/5 to 4/5 BUE Strength; 12/02/2021= 4/5 except with wrist ext. 01/11/2022= 4/5 B UE strength expept for wrist Goal status: Goal revised-no longer appropriate- working with OT on all UE strengthening.   4. Pt. Will increase strength of RLE by at least 1/2 MMT grade in order to demonstrate improvement in Standing/transfers.  Baseline: 2-/5 Right  hip flex/knee ext/flex; 04/07/2022= 2- with right hip flex/knee ext (lacking 28 deg from zero) 4/8= Patient able to ext right knee lacking 18 deg from zero. 07/05/2022=Left knee approx 8 deg from zero and right knee = 23 deg from zero; 12/29/2022 - Left LE = full ROM 4-/5 and right knee ext= 23 deg from zero= 2+/5 (gravity minimized); 03/28/2023= Left knee ext=  4/5 and right knee ext= 3-/5 (able to move lower leg through incomplete ROM -lacking 20 deg from full ROM against gravity); 05/23/2023= right knee ext= 3-/5 (able to move lower leg through incomplete ROM -lacking 17 deg from full ROM against gravity); 06/20/2023= Right knee ext= lacking 20 deg from zero  Goal status: Progressing  5. Pt. Will demo ability to stand pivot transfer with max assist for improved functional mobility and less dependent need on mechanical device.   Baseline: Dependent on hoyer lift for all transfers; 04/07/2022- Unable to test secondary to patient with recent UTI and right hand procedure - will attempt to reassess next visit. ; 04/12/22: Unable to successfully  stand due to feet plates on loaned power w/c; 05/10/2022- Patient still having to use rental chair and has not received her original power w/c back to practice SPT. 07/04/2021= Patient just received new power w/c and attempted standing from 1st time today- Patient able to stand with max assist today from w/c and did not attempt to perform pivot transfer- difficulty with static standing and placing weight on right LE. 10/06/2022- Not assessed today- patient needs new AFO prior to attempting transfers; 12/29/2022= Patient has not attempted SPT yet - still trying to improve LE strength enough to stand well without knee buckling with weight shifting. 03/28/2023- not attempted today due to ill fitting brace but patient last performed SPT on 01/10/23 and was able to perform with Max A with feet pre-positioned to angle toward direction of transfer; 05/23/2023- Not tested today but on last visit- patient able to perform SPT with max assist yet concern for twisting knee as she was unable to turn/pivot foot .  06/20/2023- deferred to next session due to patient fatigued with static standing today.  Goal status: ONGOING  6. Pt. Will demonstrate improved functional LE strength as seen by ability to stand > 2 min for improved transfer ability and pregait abilities.   Baseline: not assessed today due to recent dx: UTI; 04/07/2022- Unable to test secondary to patient with recent UTI and right hand procedure - will attempt to reassess next visit; 04/12/22: Unable to successfully stand due to feet plates on loaned power w/c 05/10/2022- Patient still having to use rental chair and has not received her original power w/c back to practice Standing. 07/05/2022- Patient able to stand with max assist at trunk today for 2 min 3 sec. Will keep goal active to ensure consistency. 10/06/2022= Unable to assess today- patient with some recent blood pressure issues and needs new AFO. 11/24/2022= Patient demonstrated multiple rounds of standing in // bars-  holding up to 1:45 min today but has been able to exhibit 2 min in recent past- working on consistency. 12/29/2022= 2 min 40 sec with patient demonstrating improved overall posture and ability to contract glutes and quads with just min assist at trunk today.   Goal status: MET  7. Pt. Will demonstrate improved functional LE strength as seen by ability to stand > 5 min for improved transfer ability and pregait abilities. Baseline: 12/29/2022= 2 min 40 sec with patient demonstrating improved overall posture and ability to  contract glutes and quads with just min assist at trunk today. 03/28/2023= Attempted 2 trials today - at best 2 min 48 sec today but during cert- did stand for 3 min 44 sec. 06/20/2023= Patient able to stand 2x (45 sec and 1 min 12 sec)- max assist yet no slippage of LLE.  07/13/2023= Patient able to stand x 3 trials today- each 1-2 min focusing on posture- patient with improving gluteal activation and less physical assist once in standing position. Goal Status: Ongoing  8. Patient will perform sit to stand transfer with moderate assist consistently > 75% of time for improved transfer ability and less dependence on caregiver.   Baseline: 12/29/2022- Patient currently max assist level assistance with all sit to stand activities. 03/28/2023= Patient is still majority of max assist to stand yet able to pull up from wheelchair smoother in one motion. 06/20/2023- Max Assist but has only most recently resumed standing since last week so goal still appropriate for new cert; 3/88/7974=dupoo max assist but less overall assist on last trial of standing with improved forward lean and gluteal activation.   Goal status: ONGOING   ASSESSMENT:  CLINICAL IMPRESSION:   Treatment was modified to just some seated LE strengthening secondary to patient reporting having some cardiac issues and going to have some more test this week. Will hold on transfer/standing til receive some update from cardiology. Keep  the intensity at moderate- modifiying her reps and activities as appropriate . Pt will continue to benefit from skilled PT services to optimize independence and reduced caregiver support.   OBJECTIVE IMPAIRMENTS Abnormal gait, decreased activity tolerance, decreased balance, decreased coordination, decreased endurance, decreased mobility, difficulty walking, decreased ROM, decreased strength, hypomobility, impaired flexibility, impaired UE functional use, postural dysfunction, and pain.   ACTIVITY LIMITATIONS carrying, lifting, bending, sitting, standing, squatting, sleeping, stairs, transfers, bed mobility, continence, bathing, toileting, dressing, self feeding, reach over head, hygiene/grooming, and caring for others  PARTICIPATION LIMITATIONS: meal prep, cleaning, laundry, medication management, personal finances, interpersonal relationship, driving, shopping, community activity, and yard work  PERSONAL FACTORS Age, Time since onset of injury/illness/exacerbation, and 1-2 comorbidities: HTN, cervical Sx are also affecting patient's functional outcome.   REHAB POTENTIAL: Good  CLINICAL DECISION MAKING: Evolving/moderate complexity  EVALUATION COMPLEXITY: Moderate  PLAN: PT FREQUENCY: 1-2x/week  PT DURATION: 12 weeks  PLANNED INTERVENTIONS: Therapeutic exercises, Therapeutic activity, Neuromuscular re-education, Balance training, Gait training, Patient/Family education, Self Care, Joint mobilization, DME instructions, Dry Needling, Electrical stimulation, Wheelchair mobility training, Spinal mobilization, Cryotherapy, Moist heat, and Manual therapy  PLAN FOR NEXT SESSION:   Continue with LE and core strength training  Sit to stand and static standing trials to promote improved standing. SPT as able.     8:43 AM, 09/12/23  8:43 AM, 09/12/23 Chyrl London, PT Physical Therapist - [facility]  Outpatient Physical Therapy- [name] [phone]

## 2023-09-14 ENCOUNTER — Emergency Department
Admission: EM | Admit: 2023-09-14 | Discharge: 2023-09-15 | Disposition: A | Discharge status: Home / Self Care | Attending: Emergency Medicine | Admitting: Emergency Medicine

## 2023-09-14 ENCOUNTER — Other Ambulatory Visit: Payer: Self-pay

## 2023-09-14 ENCOUNTER — Encounter: Payer: Self-pay | Admitting: Student

## 2023-09-14 ENCOUNTER — Ambulatory Visit

## 2023-09-14 ENCOUNTER — Emergency Department

## 2023-09-14 ENCOUNTER — Non-Acute Institutional Stay (SKILLED_NURSING_FACILITY): Payer: Self-pay | Admitting: Student

## 2023-09-14 ENCOUNTER — Ambulatory Visit: Admitting: Occupational Therapy

## 2023-09-14 DIAGNOSIS — S14126S Central cord syndrome at C6 level of cervical spinal cord, sequela: Secondary | ICD-10-CM | POA: Diagnosis not present

## 2023-09-14 DIAGNOSIS — I6782 Cerebral ischemia: Secondary | ICD-10-CM | POA: Diagnosis not present

## 2023-09-14 DIAGNOSIS — G894 Chronic pain syndrome: Secondary | ICD-10-CM | POA: Diagnosis not present

## 2023-09-14 DIAGNOSIS — R102 Pelvic and perineal pain: Secondary | ICD-10-CM | POA: Insufficient documentation

## 2023-09-14 DIAGNOSIS — Z86718 Personal history of other venous thrombosis and embolism: Secondary | ICD-10-CM | POA: Insufficient documentation

## 2023-09-14 DIAGNOSIS — R112 Nausea with vomiting, unspecified: Secondary | ICD-10-CM

## 2023-09-14 DIAGNOSIS — R531 Weakness: Secondary | ICD-10-CM | POA: Insufficient documentation

## 2023-09-14 DIAGNOSIS — I10 Essential (primary) hypertension: Secondary | ICD-10-CM | POA: Diagnosis not present

## 2023-09-14 DIAGNOSIS — R0602 Shortness of breath: Secondary | ICD-10-CM | POA: Insufficient documentation

## 2023-09-14 DIAGNOSIS — R9431 Abnormal electrocardiogram [ECG] [EKG]: Secondary | ICD-10-CM | POA: Diagnosis not present

## 2023-09-14 DIAGNOSIS — R030 Elevated blood-pressure reading, without diagnosis of hypertension: Secondary | ICD-10-CM

## 2023-09-14 DIAGNOSIS — R519 Headache, unspecified: Secondary | ICD-10-CM

## 2023-09-14 DIAGNOSIS — Z7901 Long term (current) use of anticoagulants: Secondary | ICD-10-CM | POA: Diagnosis not present

## 2023-09-14 LAB — RESP PANEL BY RT-PCR (RSV, FLU A&B, COVID)  RVPGX2
Influenza A by PCR: NEGATIVE
Influenza B by PCR: NEGATIVE
Resp Syncytial Virus by PCR: NEGATIVE
SARS Coronavirus 2 by RT PCR: NEGATIVE

## 2023-09-14 LAB — URINALYSIS, ROUTINE W REFLEX MICROSCOPIC
Bilirubin Urine: NEGATIVE
Glucose, UA: NEGATIVE mg/dL
Hgb urine dipstick: NEGATIVE
Ketones, ur: 5 mg/dL — AB
Leukocytes,Ua: NEGATIVE
Nitrite: NEGATIVE
Protein, ur: 30 mg/dL — AB
Specific Gravity, Urine: 1.013 (ref 1.005–1.030)
pH: 8 (ref 5.0–8.0)

## 2023-09-14 LAB — COMPREHENSIVE METABOLIC PANEL WITH GFR
ALT: 18 U/L (ref 0–44)
AST: 26 U/L (ref 15–41)
Albumin: 3.6 g/dL (ref 3.5–5.0)
Alkaline Phosphatase: 53 U/L (ref 38–126)
Anion gap: 11 (ref 5–15)
BUN: 13 mg/dL (ref 8–23)
CO2: 27 mmol/L (ref 22–32)
Calcium: 8.7 mg/dL — ABNORMAL LOW (ref 8.9–10.3)
Chloride: 99 mmol/L (ref 98–111)
Creatinine, Ser: 0.44 mg/dL (ref 0.44–1.00)
GFR, Estimated: >60 mL/min (ref >60)
Glucose, Bld: 143 mg/dL — ABNORMAL HIGH (ref 70–99)
Potassium: 3.6 mmol/L (ref 3.5–5.1)
Sodium: 137 mmol/L (ref 135–145)
Total Bilirubin: 1 mg/dL (ref 0.0–1.2)
Total Protein: 6.6 g/dL (ref 6.5–8.1)

## 2023-09-14 LAB — CBC
HCT: 38 % (ref 36.0–46.0)
Hemoglobin: 12.6 g/dL (ref 12.0–15.0)
MCH: 31.2 pg (ref 26.0–34.0)
MCHC: 33.2 g/dL (ref 30.0–36.0)
MCV: 94.1 fL (ref 80.0–100.0)
Platelets: 285 K/uL (ref 150–400)
RBC: 4.04 MIL/uL (ref 3.87–5.11)
RDW: 14.2 % (ref 11.5–15.5)
WBC: 7.5 K/uL (ref 4.0–10.5)
nRBC: 0 % (ref 0.0–0.2)

## 2023-09-14 LAB — LIPASE, BLOOD: Lipase: 34 U/L (ref 11–51)

## 2023-09-14 LAB — TROPONIN I (HIGH SENSITIVITY): Troponin I (High Sensitivity): 5 ng/L (ref <18)

## 2023-09-14 MED ORDER — LACTATED RINGERS IV BOLUS
1000.0000 mL | Freq: Once | INTRAVENOUS | Status: AC
  Administered 2023-09-14 (×2): 1000 mL via INTRAVENOUS

## 2023-09-14 MED ORDER — IPRATROPIUM-ALBUTEROL 0.5-2.5 (3) MG/3ML IN SOLN
3.0000 mL | Freq: Once | RESPIRATORY_TRACT | Status: AC
  Administered 2023-09-14 (×2): 3 mL via RESPIRATORY_TRACT
  Filled 2023-09-14: qty 3

## 2023-09-14 MED ORDER — PROCHLORPERAZINE EDISYLATE 10 MG/2ML IJ SOLN
5.0000 mg | Freq: Once | INTRAMUSCULAR | Status: AC
  Administered 2023-09-14 (×2): 5 mg via INTRAVENOUS
  Filled 2023-09-14: qty 2

## 2023-09-14 MED ORDER — DIPHENHYDRAMINE HCL 50 MG/ML IJ SOLN
25.0000 mg | Freq: Once | INTRAMUSCULAR | Status: AC
  Administered 2023-09-14 (×2): 25 mg via INTRAVENOUS
  Filled 2023-09-14: qty 1

## 2023-09-14 MED ORDER — PROCHLORPERAZINE MALEATE 5 MG PO TABS: 5.0000 mg | ORAL_TABLET | Freq: Four times a day (QID) | ORAL | 0 refills | Status: DC | PRN

## 2023-09-14 NOTE — ED Provider Notes (Signed)
 [facility] Provider Note    Event Date/Time   First MD Initiated Contact with Patient 09/14/23 1742     (approximate)   History   Chief Complaint Nausea   HPI  Sherry [name] is a 89 y.o. female with past medical history of hypertension, DVT on Xarelto , C6 central cord syndrome, and chronic pain syndrome who presents to the ED complaining of nausea.  Patient reports that she has been dealing with a frontal headache as well as nausea and vomiting since waking up this morning.  She denies any fevers or neck stiffness, has not had any cough, chest pain, shortness of breath, diarrhea, abdominal pain, or dysuria.  She has chronic weakness in her lower extremities as well as both hands due to prior spinal cord injury, denies any new neurologic symptoms.  She has taken Tylenol  without significant relief, was referred to the ED by the physician at her nursing facility.     Physical Exam   Triage Vital Signs: ED Triage Vitals  Encounter Vitals Group     BP 09/14/23 1359 (!) 174/96     Girls Systolic BP Percentile --      Girls Diastolic BP Percentile --      Boys Systolic BP Percentile --      Boys Diastolic BP Percentile --      Pulse Rate 09/14/23 1359 86     Resp 09/14/23 1359 17     Temp 09/14/23 1359 98 F (36.7 C)     Temp Source 09/14/23 1359 Oral     SpO2 09/14/23 1359 92 %     Weight 09/14/23 1354 190 lb 11.2 oz (86.5 kg)     Height --      Head Circumference --      Peak Flow --      Pain Score 09/14/23 1354 0     Pain Loc --      Pain Education --      Exclude from Growth Chart --     Most recent vital signs: Vitals:   09/14/23 2130 09/14/23 2200  BP: (!) 145/90 (!) 145/75  Pulse: 96 97  Resp: 12 16  Temp:    SpO2: 94% 90%    Constitutional: Alert and oriented. Eyes: Conjunctivae are normal. Head: Atraumatic. Nose: No congestion/rhinnorhea. Mouth/Throat: Mucous membranes are moist.  Neck: Supple with no  meningismus. Cardiovascular: Normal rate, regular rhythm. Grossly normal heart sounds.  2+ radial pulses bilaterally. Respiratory: Normal respiratory effort.  No retractions. Lungs CTAB. Gastrointestinal: Soft and nontender. No distention. Musculoskeletal: No lower extremity tenderness nor edema.  Neurologic:  Normal speech and language.  Chronic weakness to the bilateral lower extremities, unchanged.  Chronic weakness to both hands that is unchanged, strength otherwise intact to her upper extremities.    ED Results / Procedures / Treatments   Labs (all labs ordered are listed, but only abnormal results are displayed) Labs Reviewed  COMPREHENSIVE METABOLIC PANEL WITH GFR - Abnormal; Notable for the following components:      Result Value   Glucose, Bld 143 (*)    Calcium  8.7 (*)    All other components within normal limits  URINALYSIS, ROUTINE W REFLEX MICROSCOPIC - Abnormal; Notable for the following components:   Color, Urine YELLOW (*)    APPearance CLOUDY (*)    Ketones, ur 5 (*)    Protein, ur 30 (*)    Bacteria, UA MANY (*)    All other components within normal limits  RESP PANEL BY RT-PCR (RSV, FLU A&B, COVID)  [id number]  URINE CULTURE  LIPASE, BLOOD  CBC  TROPONIN I (HIGH SENSITIVITY)    RADIOLOGY CT head reviewed and interpreted by me with no hemorrhage or midline shift.  PROCEDURES:  Critical Care performed: No  Procedures   MEDICATIONS ORDERED IN ED: Medications  prochlorperazine  (COMPAZINE ) injection 5 mg (5 mg Intravenous Given 09/14/23 1801)  diphenhydrAMINE  (BENADRYL ) injection 25 mg (25 mg Intravenous Given 09/14/23 1801)  lactated ringers  bolus 1,000 mL (0 mLs Intravenous Stopped 09/14/23 2052)  ipratropium-albuterol  (DUONEB) 0.5-2.5 (3) MG/3ML nebulizer solution 3 mL (3 mLs Nebulization Given 09/14/23 2109)     IMPRESSION / MDM / ASSESSMENT AND PLAN / ED COURSE  I reviewed the triage vital signs and the nursing notes.                              89  y.o. female with past medical history of hypertension, DVT on Xarelto , C6 central cord syndrome, and chronic pain syndrome who presents to the ED complaining of headache and nausea since waking up this morning.  Patient's presentation is most consistent with acute presentation with potential threat to life or bodily function.  Differential diagnosis includes, but is not limited to, SAH, meningitis, tension headache, migraine headache, gastroenteritis, dehydration, electrolyte abnormality, AKI.  Patient nontoxic-appearing and in no acute distress, vital signs remarkable for hypertension but otherwise reassuring.  No features concerning for meningitis and I doubt SAH given gradually worsening headache over the course of the day.  She has chronic neurodeficits that are unchanged today, will check CT head given new onset headache in this elderly patient.  Labs are reassuring without significant anemia, leukocytosis, electrolyte abnormality, or AKI.  LFTs and lipase are also unremarkable.  We will treat with IV Compazine  and Benadryl , hydrate with IV fluids and reassess following imaging.  CT head is negative for acute process, headache and nausea improving following migraine cocktail.  Patient noted to have occasional drops of oxygen saturations down as low as 87% on room air, improved on 3 L nasal cannula.  She denies any difficulty breathing or cough, has no history of COPD.  COVID and flu testing is negative, chest x-ray unremarkable, EKG shows no evidence of arrhythmia or ischemia, troponin within normal limits.  She was given a breathing treatment, now pain attaining oxygen saturations consistently at 93% on room air, continues to deny difficulty breathing.  We discussed admission to the hospital, but patient prefers to return back to her nursing facility, which is reasonable given reassuring workup.  Patient and daughter counseled to return to the ED for new or worsening symptoms, patient agrees with  plan.      FINAL CLINICAL IMPRESSION(S) / ED DIAGNOSES   Final diagnoses:  Acute nonintractable headache, unspecified headache type  Nausea and vomiting, unspecified vomiting type     Rx / DC Orders   ED Discharge Orders          Ordered    prochlorperazine  (COMPAZINE ) 5 MG tablet  Every 6 hours PRN        09/14/23 2221             Note:  This document was prepared using Dragon voice recognition software and may include unintentional dictation errors.   Willo Dunnings, MD 09/14/23 2223

## 2023-09-14 NOTE — ED Notes (Signed)
 Pt states they have had a horrible HA all day and has been throwing up which started this morning. Pt denies any vision changes, fevers, SOB, and CP, diarrhea, nor pain with urination (pt has an indwelling catheter from prior spinal cord injury).

## 2023-09-14 NOTE — Progress Notes (Signed)
 Location:  Other [facility].  Nursing Home Room Number: [facility] [id number] Place of Service:  SNF [phone]) Provider:  Abdul Fine, MD  Patient Care Team: Abdul Fine, MD as PCP - General (Family Medicine)  Extended Emergency Contact Information Primary Emergency Contact: [name],[name] Mobile Phone: [phone] Relation: Daughter Secondary Emergency Contact: Mathias Lynwood HERO Address: [address]          [place], [place] [address] [place] Home Phone: [phone] Mobile Phone: [phone] Relation: Spouse  Code Status:  DNR Goals of care: Advanced Directive information    09/14/2023    1:55 PM  Advanced Directives  Does Patient Have a Medical Advance Directive? No;Yes  Type of Advance Directive Out of facility DNR (pink MOST or yellow form)     Chief Complaint  Patient presents with   ER    ER    HPI:  Pt is a 89 y.o. female seen today for an acute visit for ER  History of Present Illness The patient presents with headache, nausea, and vomiting.  She woke up early this morning with a frontal headache and feeling generally unwell. The headache has not been alleviated by Tylenol . She does not usually experience headaches and has no associated visual changes or facial tenderness. No chest pain or fever is present. Her blood pressure has been dropping periodically lately.  She has experienced nausea and vomiting, with the vomitus described as red Gatorade. She feels very bloated and has not eaten today due to feeling too bloated and full. She has only consumed a small amount of Gatorade and acknowledges not having enough fluids this morning. She had a bowel movement this morning after an enema was administered, which was effective. No blood in her stool was reported.  No changes in vision or weakness. She has been able to move her arms without difficulty. She mentions not sleeping well generally.     Past Medical History:  Diagnosis Date    Acute blood loss anemia    Arthritis    Cancer (HCC)    skin   Central cord syndrome at C6 level of cervical spinal cord (HCC) 11/29/2017   Hypertension    Protein-calorie malnutrition, severe (HCC) 01/24/2018   S/P insertion of IVC (inferior vena caval) filter 01/24/2018   Tetraparesis (HCC)    Past Surgical History:  Procedure Laterality Date   ANTERIOR CERVICAL DECOMP/DISCECTOMY FUSION N/A 11/29/2017   Procedure: Cervical five-six, six-seven Anterior Cervical Decompression Fusion;  Surgeon: Cheryle Debby LABOR, MD;  Location: [facility] OR;  Service: Neurosurgery;  Laterality: N/A;  Cervical five-six, six-seven Anterior Cervical Decompression Fusion   CATARACT EXTRACTION     FEMUR IM NAIL Left 08/16/2021   Procedure: INTRAMEDULLARY (IM) NAIL FEMORAL;  Surgeon: Cleotilde Barrio, MD;  Location: [facility] ORS;  Service: Orthopedics;  Laterality: Left;   IR IVC FILTER PLMT / S&I /IMG GUID/MOD SED  12/08/2017    Allergies  Allergen Reactions   Other Swelling   Lisinopril-Hydrochlorothiazide Swelling    Swelling of the tongue   Beef-Derived Drug Products Swelling    Facial and lip swelling (due to tick bite)   Dairy Aid [Tilactase] Swelling   Lisinopril-Hydrochlorothiazide Swelling    Tongue swelling   Pork-Derived Products Swelling    Outpatient Encounter Medications as of 09/14/2023  Medication Sig   acetaminophen  (TYLENOL ) 500 MG tablet Take 500 mg by mouth every 12 (twelve) hours as needed for mild pain. (Patient taking differently: Take 1,000 mg by mouth 3 (three) times daily.)  acetic acid 0.25 % irrigation Use 30ml via irrigation twice daily to keep foley Cath patent.   alendronate (FOSAMAX) 70 MG tablet Take 70 mg by mouth every Saturday.   calcium  carbonate (CALCIUM  600) 600 MG TABS tablet Take 600 mg by mouth at bedtime.   Capsaicin  0.1 % CREA Apply 1 application  topically every evening. (Apply to hands)   cetirizine (ZYRTEC) 5 MG tablet Take 5 mg by mouth daily.   cholecalciferol   (VITAMIN D3) 25 MCG (1000 UNIT) tablet Take 1,000 Units by mouth daily.   DULoxetine  (CYMBALTA ) 60 MG capsule Take 60 mg by mouth daily.   ferrous sulfate  325 (65 FE) MG tablet Take 325 mg by mouth daily with breakfast.   fluocinonide (LIDEX) 0.05 % external solution Apply 1 Application topically every 12 (twelve) hours as needed.   furosemide (LASIX) 20 MG tablet Take 20 mg by mouth daily.   gabapentin  (NEURONTIN ) 100 MG capsule Take 1 capsule (100 mg total) by mouth daily.   hydroxypropyl methylcellulose / hypromellose (ISOPTO TEARS / GONIOVISC) 2.5 % ophthalmic solution Place 1 drop into both eyes every hour as needed for dry eyes.   ketoconazole (NIZORAL) 2 % shampoo Apply 1 Application topically as directed. Every shift on Thursday for seborrheic dermatitis flare up   lactulose , encephalopathy, (CHRONULAC ) 10 GM/15ML SOLN Take 30 mLs by mouth daily as needed (mild constipation).   magnesium  citrate solution Take 60-120 mLs by mouth daily as needed (constipation).   Menthol , Topical Analgesic, (BIOFREEZE COOL THE PAIN) 4 % GEL Apply 1 application  topically every 8 (eight) hours as needed (for left knee pain).   methylcellulose oral powder Take 1 packet by mouth at bedtime.   midodrine  (PROAMATINE ) 10 MG tablet Take 10 mg by mouth 3 (three) times daily.   Multiple Vitamin (MULTIVITAMIN WITH MINERALS) TABS tablet Take 1 tablet by mouth daily.   olopatadine (PATADAY) 0.1 % ophthalmic solution Place 1 drop into both eyes 2 (two) times daily.   polyethylene glycol (MIRALAX  / GLYCOLAX ) 17 g packet Take 17 g by mouth daily as needed.   rivaroxaban  (XARELTO ) 10 MG TABS tablet Take 10 mg by mouth daily.   saccharomyces boulardii (FLORASTOR) 250 MG capsule Take 250 mg by mouth daily.   Saline Spray 0.2 % SOLN Place 2 sprays into both nostrils as needed (nasal congestion).   simethicone  (MYLICON) 80 MG chewable tablet Chew 80 mg by mouth every 6 (six) hours as needed for flatulence.   sodium phosphate   (FLEET) 7-19 GM/118ML ENEM Place 1 enema rectally every other day as needed for severe constipation.   tiZANidine  (ZANAFLEX ) 2 MG tablet Take 1 tablet (2 mg total) by mouth 2 (two) times daily.   triamcinolone  cream (KENALOG ) 0.1 % Apply to thigh and lower legs topically every 8 hours as needed. Apply to back of neck topically as needed.   spironolactone  (ALDACTONE ) 25 MG tablet Take 0.5 tablets (12.5 mg total) by mouth daily. (Patient not taking: Reported on 09/14/2023)   No facility-administered encounter medications on file as of 09/14/2023.    Review of Systems  Immunization History  Administered Date(s) Administered   Influenza, High Dose Seasonal PF 11/17/2021   Influenza-Unspecified 11/14/2018, 11/20/2019, 11/17/2020, 11/17/2021, 11/24/2022   Moderna Covid-19 Fall Seasonal Vaccine 63yrs & older 05/11/2022, 05/13/2023   Moderna Covid-19 Vaccine Bivalent Booster 78yrs & up 06/30/2021, 12/11/2021, 10/29/2022   Moderna Sars-Covid-2 Vaccination 02/09/2019, 03/09/2019, 12/14/2019, 06/20/2020, 10/24/2020   PPD Test 05/16/2019   Tdap 11/29/2017   Zoster Recombinant(Shingrix)  09/02/2022, 12/04/2022   Pertinent  Health Maintenance Due  Topic Date Due   INFLUENZA VACCINE  09/02/2023   DEXA SCAN  Discontinued      08/19/2021   10:00 AM 08/19/2021    8:54 PM 08/20/2021    8:20 AM 03/29/2022   10:11 AM 08/31/2022    2:42 PM  Fall Risk  Falls in the past year?    0 0  Was there an injury with Fall?    0   Fall Risk Category Calculator    0   (RETIRED) Patient Fall Risk Level High fall risk  High fall risk  High fall risk     Patient at Risk for Falls Due to    No Fall Risks      Data saved with a previous flowsheet row definition   Functional Status Survey:    Vitals:   09/14/23 1421  BP: (!) 215/78  Pulse: 89  Resp: 20  Temp: 98.2 F (36.8 C)  SpO2: 95%  Weight: 193 lb 11.2 oz (87.9 kg)  Height: 5' 7 (1.702 m)   Body mass index is 30.34 kg/m. Physical  Exam Constitutional:      Comments: Ill appearing, vomiting  Cardiovascular:     Rate and Rhythm: Normal rate.     Pulses: Normal pulses.  Pulmonary:     Effort: Pulmonary effort is normal.  Abdominal:     Comments: Distended, tender to palpation  Neurological:     Mental Status: She is alert and oriented to person, place, and time.     Labs reviewed: Recent Labs    08/10/23 0000 08/18/23 0000 08/29/23 0000  NA 133* 139 139  K 4.5 5.0 4.7  CL 99 101 100  CO2 27* 32* 31*  BUN 10 10 9   CREATININE 0.5 0.5 0.5  CALCIUM  8.9 9.3 9.5   Recent Labs    09/29/22 0000 05/09/23 0000 08/29/23 0000  AST 20 21 17   ALT 14 16 12   ALKPHOS 58 59 53  ALBUMIN  3.6 3.6 3.6   Recent Labs    05/09/23 0000 08/10/23 0000 08/29/23 0000 09/14/23 1400  WBC 4.6 6.3 4.9 7.5  NEUTROABS 2,272.00 4,038.00 2,528.00  --   HGB 12.0 11.8* 12.2 12.6  HCT 38 36 37 38.0  MCV  --   --   --  94.1  PLT 296 280 305 285   Lab Results  Component Value Date   TSH 1.15 08/29/2023   No results found for: HGBA1C No results found for: CHOL, HDL, LDLCALC, LDLDIRECT, TRIG, CHOLHDL  Significant Diagnostic Results in last 30 days:  No results found.  Assessment/Plan Nausea and vomiting with abdominal bloating and discomfort Acute episode with recent enema providing relief. Vomited red Gatorade, no hematochezia. No recent food intake due to bloating. - Order KUB x-ray to assess for gastrointestinal obstruction or other abdominal issues - Administer antiemetic medication  Headache Acute frontal headache, no history of frequent headaches. Tylenol  ineffective. No facial tenderness, visual changes, or weakness. - Administer additional dose of Tylenol   Elevated blood pressure Elevated blood pressure today with recent episodes of hypotension. No chest pain or fever. Declined emergency department evaluation. - Discussed emergency department evaluation  Family/ staff Communication:  nursing  Labs/tests ordered:  KUB

## 2023-09-14 NOTE — ED Notes (Signed)
 This RN with the help of Rosina RN performed pericare on pt after large bowel movement. New linens, chuks pad, brief were applied. Pt tolerated activity well. Bed was placed in lowest locked position, call bell in reach.

## 2023-09-14 NOTE — Group Note (Deleted)
 Date:  09/14/2023 Time:  2:21 PM  Group Topic/Focus:  Wellness Toolbox:   The focus of this group is to discuss various aspects of wellness, balancing those aspects and exploring ways to increase the ability to experience wellness.  Patients will create a wellness toolbox for use upon discharge.     Participation Level:  {BHH PARTICIPATION OZCZO:77735}  Participation Quality:  {BHH PARTICIPATION QUALITY:22265}  Affect:  {BHH AFFECT:22266}  Cognitive:  {BHH COGNITIVE:22267}  Insight: {BHH Insight2:20797}  Engagement in Group:  {BHH ENGAGEMENT IN HMNLE:77731}  Modes of Intervention:  {BHH MODES OF INTERVENTION:22269}  Additional Comments:  ***  Myra Curtistine BROCKS 09/14/2023, 2:21 PM

## 2023-09-14 NOTE — ED Notes (Signed)
 Called [facility] to arrange transport back to [facility] spoke to [place] @ 2230 provided demographics needed to get approval per Rep truck will pick up after midnight

## 2023-09-14 NOTE — ED Triage Notes (Signed)
 Arrives from SNF.C/O hypotension, nausea, headache.  1g tylenol  given by facility for HA and 4 mg zofran  given by EMS via 20 g saline lock to left hand. AAOx3. Skin warm and dry. NAD

## 2023-09-15 ENCOUNTER — Encounter: Payer: Self-pay | Admitting: Student

## 2023-09-15 ENCOUNTER — Non-Acute Institutional Stay (SKILLED_NURSING_FACILITY): Admitting: Nurse Practitioner

## 2023-09-15 ENCOUNTER — Encounter: Payer: Self-pay | Admitting: Nurse Practitioner

## 2023-09-15 DIAGNOSIS — K5901 Slow transit constipation: Secondary | ICD-10-CM | POA: Diagnosis not present

## 2023-09-15 DIAGNOSIS — N319 Neuromuscular dysfunction of bladder, unspecified: Secondary | ICD-10-CM

## 2023-09-15 DIAGNOSIS — R600 Localized edema: Secondary | ICD-10-CM

## 2023-09-15 DIAGNOSIS — I110 Hypertensive heart disease with heart failure: Secondary | ICD-10-CM

## 2023-09-15 DIAGNOSIS — M792 Neuralgia and neuritis, unspecified: Secondary | ICD-10-CM

## 2023-09-15 DIAGNOSIS — R110 Nausea: Secondary | ICD-10-CM

## 2023-09-15 DIAGNOSIS — I5089 Other heart failure: Secondary | ICD-10-CM

## 2023-09-15 DIAGNOSIS — G8250 Quadriplegia, unspecified: Secondary | ICD-10-CM | POA: Diagnosis not present

## 2023-09-15 DIAGNOSIS — I951 Orthostatic hypotension: Secondary | ICD-10-CM

## 2023-09-15 MED ORDER — GABAPENTIN 100 MG PO CAPS: 100.0000 mg | ORAL_CAPSULE | Freq: Every day | ORAL | Status: AC

## 2023-09-15 MED ORDER — MIDODRINE HCL 10 MG PO TABS: 5.0000 mg | ORAL_TABLET | Freq: Three times a day (TID) | ORAL | Status: DC | PRN

## 2023-09-15 NOTE — Progress Notes (Signed)
 Location:  Other [place].  Nursing Home Room Number: [id number] Place of Service:  [phone]) Harlene An ,NP  PCP: Abdul Fine, MD  Patient Care Team: Abdul Fine, MD as PCP - General (Family Medicine)  Extended Emergency Contact Information Primary Emergency Contact: [name],[name] Mobile Phone: [phone] Relation: Daughter Secondary Emergency Contact: Mathias Lynwood HERO Address: [address]          [place], [place] [address] [place] Home Phone: [phone] Mobile Phone: [phone] Relation: Spouse  Goals of care: Advanced Directive information    09/14/2023    1:55 PM  Advanced Directives  Does Patient Have a Medical Advance Directive? No;Yes  Type of Advance Directive Out of facility DNR (pink MOST or yellow form)     Chief Complaint  Patient presents with   Medical Management of Chronic Issues    Medical Management of Chronic Issues.     HPI:  Pt is a 89 y.o. female seen today for medical management of chronic disease.  She went to the ED yesterday due to elevated BP, headache and nausea.  BP had improved in the ED, there was no acute findings noted.  Today she remains somewhat nauseated but this is improving, ate small amounts for breakfast and lunch. She reports normal BM and no vomiting at this time States headache also improving She has been having low bp and on midodrine  but staff has not given in 2 days due to elevated BP. She reports ongoing feeling of being bloated which has been chronic- KUB in late July without acute findings and moderate stool in colon.  She had has some weight gain and swelling noted since stopping aldactone - lasix 20 mg started 2 days ago and she reports decrease in swelling to lower extremities noted, she has not had weight due to not feeling well.  She reports she is sleepy today but this is due to not sleeping well from getting back late from the ED.  No fevers or chills.  No  shortness of breath or chest pains noted.    Past Medical History:  Diagnosis Date   Acute blood loss anemia    Arthritis    Cancer (HCC)    skin   Central cord syndrome at C6 level of cervical spinal cord (HCC) 11/29/2017   Hypertension    Protein-calorie malnutrition, severe (HCC) 01/24/2018   S/P insertion of IVC (inferior vena caval) filter 01/24/2018   Tetraparesis (HCC)    Past Surgical History:  Procedure Laterality Date   ANTERIOR CERVICAL DECOMP/DISCECTOMY FUSION N/A 11/29/2017   Procedure: Cervical five-six, six-seven Anterior Cervical Decompression Fusion;  Surgeon: Cheryle Debby LABOR, MD;  Location: [facility] OR;  Service: Neurosurgery;  Laterality: N/A;  Cervical five-six, six-seven Anterior Cervical Decompression Fusion   CATARACT EXTRACTION     FEMUR IM NAIL Left 08/16/2021   Procedure: INTRAMEDULLARY (IM) NAIL FEMORAL;  Surgeon: Cleotilde Barrio, MD;  Location: [facility] ORS;  Service: Orthopedics;  Laterality: Left;   IR IVC FILTER PLMT / S&I /IMG GUID/MOD SED  12/08/2017    Allergies  Allergen Reactions   Other Swelling   Lisinopril-Hydrochlorothiazide Swelling    Swelling of the tongue   Beef-Derived Drug Products Swelling    Facial and lip swelling (due to tick bite)   Dairy Aid [Tilactase] Swelling   Lisinopril-Hydrochlorothiazide Swelling    Tongue swelling   Pork-Derived Products Swelling    Outpatient Encounter Medications as of 09/15/2023  Medication Sig   acetaminophen  (TYLENOL ) 500 MG  tablet Take 500 mg by mouth every 12 (twelve) hours as needed for mild pain. (Patient taking differently: Take 1,000 mg by mouth 3 (three) times daily.)   acetic acid 0.25 % irrigation Use 30ml via irrigation twice daily to keep foley Cath patent.   alendronate (FOSAMAX) 70 MG tablet Take 70 mg by mouth every Saturday.   calcium  carbonate (CALCIUM  600) 600 MG TABS tablet Take 600 mg by mouth at bedtime.   Capsaicin  0.1 % CREA Apply 1 application  topically every evening. (Apply to  hands)   cetirizine (ZYRTEC) 5 MG tablet Take 5 mg by mouth daily.   cholecalciferol  (VITAMIN D3) 25 MCG (1000 UNIT) tablet Take 1,000 Units by mouth daily.   DULoxetine  (CYMBALTA ) 60 MG capsule Take 60 mg by mouth daily.   ferrous sulfate  325 (65 FE) MG tablet Take 325 mg by mouth daily with breakfast.   fluocinonide (LIDEX) 0.05 % external solution Apply 1 Application topically every 12 (twelve) hours as needed.   furosemide (LASIX) 20 MG tablet Take 20 mg by mouth daily.   gabapentin  (NEURONTIN ) 100 MG capsule Take 1 capsule (100 mg total) by mouth daily. (Patient taking differently: Take 100 mg by mouth at bedtime.)   hydroxypropyl methylcellulose / hypromellose (ISOPTO TEARS / GONIOVISC) 2.5 % ophthalmic solution Place 1 drop into both eyes every hour as needed for dry eyes.   ketoconazole (NIZORAL) 2 % shampoo Apply 1 Application topically as directed. Every shift on Thursday for seborrheic dermatitis flare up   lactulose , encephalopathy, (CHRONULAC ) 10 GM/15ML SOLN Take 30 mLs by mouth daily as needed (mild constipation).   magnesium  citrate solution Take 60-120 mLs by mouth daily as needed (constipation).   Menthol , Topical Analgesic, (BIOFREEZE COOL THE PAIN) 4 % GEL Apply 1 application  topically every 8 (eight) hours as needed (for left knee pain).   methylcellulose oral powder Take 1 packet by mouth at bedtime.   midodrine  (PROAMATINE ) 10 MG tablet Take 10 mg by mouth 3 (three) times daily as needed (Blood Pressure <90/60).   Multiple Vitamin (MULTIVITAMIN WITH MINERALS) TABS tablet Take 1 tablet by mouth daily.   olopatadine (PATADAY) 0.1 % ophthalmic solution Place 1 drop into both eyes 2 (two) times daily.   polyethylene glycol (MIRALAX  / GLYCOLAX ) 17 g packet Take 17 g by mouth daily as needed.   rivaroxaban  (XARELTO ) 10 MG TABS tablet Take 10 mg by mouth daily.   saccharomyces boulardii (FLORASTOR) 250 MG capsule Take 250 mg by mouth daily.   Saline Spray 0.2 % SOLN Place 2  sprays into both nostrils as needed (nasal congestion).   simethicone  (MYLICON) 80 MG chewable tablet Chew 80 mg by mouth every 6 (six) hours as needed for flatulence.   sodium phosphate  (FLEET) 7-19 GM/118ML ENEM Place 1 enema rectally every other day as needed for severe constipation.   tiZANidine  (ZANAFLEX ) 2 MG tablet Take 1 tablet (2 mg total) by mouth 2 (two) times daily.   triamcinolone  cream (KENALOG ) 0.1 % Apply to thigh and lower legs topically every 8 hours as needed. Apply to back of neck topically as needed.   prochlorperazine  (COMPAZINE ) 5 MG tablet Take 1 tablet (5 mg total) by mouth every 6 (six) hours as needed. (Patient not taking: Reported on 09/15/2023)   spironolactone  (ALDACTONE ) 25 MG tablet Take 0.5 tablets (12.5 mg total) by mouth daily. (Patient not taking: Reported on 09/15/2023)   No facility-administered encounter medications on file as of 09/15/2023.    Review of Systems  Constitutional:  Negative for activity change, appetite change, fatigue and unexpected weight change.  HENT:  Negative for congestion and hearing loss.   Eyes: Negative.   Respiratory:  Negative for cough and shortness of breath.   Cardiovascular:  Negative for chest pain, palpitations and leg swelling.  Gastrointestinal:  Positive for constipation and nausea. Negative for abdominal distention, abdominal pain and diarrhea.  Genitourinary:  Negative for difficulty urinating and dysuria.       Chronic catheter   Musculoskeletal:  Negative for arthralgias and myalgias.  Skin:  Negative for color change and wound.  Neurological:  Positive for weakness. Negative for dizziness.  Psychiatric/Behavioral:  Negative for agitation, behavioral problems and confusion.      Immunization History  Administered Date(s) Administered   Influenza, High Dose Seasonal PF 11/17/2021   Influenza-Unspecified 11/14/2018, 11/20/2019, 11/17/2020, 11/17/2021, 11/24/2022   Moderna Covid-19 Fall Seasonal Vaccine 66yrs &  older 05/11/2022, 05/13/2023   Moderna Covid-19 Vaccine Bivalent Booster 28yrs & up 06/30/2021, 12/11/2021, 10/29/2022   Moderna Sars-Covid-2 Vaccination 02/09/2019, 03/09/2019, 12/14/2019, 06/20/2020, 10/24/2020   PPD Test 05/16/2019   Tdap 11/29/2017   Zoster Recombinant(Shingrix) 09/02/2022, 12/04/2022   Pertinent  Health Maintenance Due  Topic Date Due   INFLUENZA VACCINE  09/02/2023   DEXA SCAN  Discontinued      08/19/2021   10:00 AM 08/19/2021    8:54 PM 08/20/2021    8:20 AM 03/29/2022   10:11 AM 08/31/2022    2:42 PM  Fall Risk  Falls in the past year?    0 0  Was there an injury with Fall?    0   Fall Risk Category Calculator    0   (RETIRED) Patient Fall Risk Level High fall risk  High fall risk  High fall risk     Patient at Risk for Falls Due to    No Fall Risks      Data saved with a previous flowsheet row definition   Functional Status Survey:    Vitals:   09/15/23 0909 09/15/23 1008  BP: (!) 215/78 (!) 158/78  Pulse: 89   Resp: 20   Temp: 98.2 F (36.8 C)   SpO2: 95%   Weight: 193 lb 11.2 oz (87.9 kg)   Height: 5' 7 (1.702 m)    Body mass index is 30.34 kg/m. Physical Exam Constitutional:      General: She is not in acute distress.    Appearance: She is well-developed. She is not diaphoretic.  HENT:     Head: Normocephalic and atraumatic.     Mouth/Throat:     Pharynx: No oropharyngeal exudate.  Eyes:     Conjunctiva/sclera: Conjunctivae normal.     Pupils: Pupils are equal, round, and reactive to light.  Cardiovascular:     Rate and Rhythm: Normal rate and regular rhythm.     Heart sounds: Normal heart sounds.  Pulmonary:     Effort: Pulmonary effort is normal.     Breath sounds: Normal breath sounds.  Abdominal:     General: Bowel sounds are normal.     Palpations: Abdomen is soft.  Musculoskeletal:     Cervical back: Normal range of motion and neck supple.     Right lower leg: No edema.     Left lower leg: No edema.  Skin:     General: Skin is warm and dry.  Neurological:     Mental Status: She is alert and oriented to person, place, and time.  Motor: Weakness present.     Gait: Gait abnormal.  Psychiatric:        Mood and Affect: Mood normal.     Labs reviewed: Recent Labs    08/18/23 0000 08/29/23 0000 09/14/23 1400  NA 139 139 137  K 5.0 4.7 3.6  CL 101 100 99  CO2 32* 31* 27  GLUCOSE  --   --  143*  BUN 10 9 13   CREATININE 0.5 0.5 0.44  CALCIUM  9.3 9.5 8.7*   Recent Labs    05/09/23 0000 08/29/23 0000 09/14/23 1400  AST 21 17 26   ALT 16 12 18   ALKPHOS 59 53 53  BILITOT  --   --  1.0  PROT  --   --  6.6  ALBUMIN  3.6 3.6 3.6   Recent Labs    05/09/23 0000 08/10/23 0000 08/29/23 0000 09/14/23 1400  WBC 4.6 6.3 4.9 7.5  NEUTROABS 2,272.00 4,038.00 2,528.00  --   HGB 12.0 11.8* 12.2 12.6  HCT 38 36 37 38.0  MCV  --   --   --  94.1  PLT 296 280 305 285   Lab Results  Component Value Date   TSH 1.15 08/29/2023   No results found for: HGBA1C No results found for: CHOL, HDL, LDLCALC, LDLDIRECT, TRIG, CHOLHDL  Significant Diagnostic Results in last 30 days:  DG Chest 2 View Result Date: 09/14/2023 CLINICAL DATA:  SOB EXAM: CHEST - 2 VIEW COMPARISON:  August 15, 2021 FINDINGS: No focal airspace consolidation, pleural effusion, or pneumothorax. Mild cardiomegaly. Tortuous aorta with aortic atherosclerosis. No acute fracture or destructive lesions. Multilevel thoracic osteophytosis. Osteopenia. Bilateral AC joint osteoarthritis. Cervical fusion hardware. IMPRESSION: No acute cardiopulmonary abnormality. Electronically Signed   By: Rogelia Myers M.D.   On: 09/14/2023 20:41   CT Head Wo Contrast Result Date: 09/14/2023 CLINICAL DATA:  Initial evaluation for acute headache. EXAM: CT HEAD WITHOUT CONTRAST TECHNIQUE: Contiguous axial images were obtained from the base of the skull through the vertex without intravenous contrast. RADIATION DOSE REDUCTION: This exam was  performed according to the departmental dose-optimization program which includes automated exposure control, adjustment of the mA and/or kV according to patient size and/or use of iterative reconstruction technique. COMPARISON:  None available. FINDINGS: Brain: Mild age-related cerebral atrophy with chronic small vessel ischemic disease. No acute intracranial hemorrhage. No acute large vessel territory infarct. No mass lesion, midline shift or mass effect. No hydrocephalus or extra-axial fluid collection. Vascular: No abnormal hyperdense vessel. Scattered vascular calcifications noted within the carotid siphons. Skull: Scalp soft tissues within normal limits.  Calvarium intact. Sinuses/Orbits: Globes and orbital soft tissues demonstrate no acute finding. Mild mucosal thickening present about the right posterior ethmoidal air cells. Visualized paranasal sinuses are otherwise clear. No significant mastoid effusion. Other: None. IMPRESSION: 1. No acute intracranial abnormality. 2. Mild age-related cerebral atrophy with chronic small vessel ischemic disease. Electronically Signed   By: Morene Hoard M.D.   On: 09/14/2023 20:13    Assessment/Plan Tetraparesis (HCC) Partial post cervical spine fracture Continues supportive care.   Orthostatic hypotension Has improved, now bp on higher side and staff holding midodrine .  Will continue midodrine  5 mg TID PRN  Neuropathic pain Stable on gabapentin  at bedtime   Neurogenic bladder Continues on foley, reports functioning well at this time. Continues routine flushes  Hypertensive heart disease with other congestive heart failure (HCC) Stable at this time, not currently on medication due to recent hypotension. Will monitor  Constipation due to slow transit  Feelings of being bloated for several months, KUB in July revealed moderate stool- will add miralax  17 gm daily at this time.   Bilateral leg edema Improved since starting lasix, will continue and  follow up bmp in 1 week  8. Nausea Has improved at this time, advance diet as tolerates Zofran  4 mg q 6 hours PRN    Teanna Elem K. Caro BODILY [facility] [phone]

## 2023-09-15 NOTE — Assessment & Plan Note (Signed)
 Stable at this time, not currently on medication due to recent hypotension. Will monitor

## 2023-09-15 NOTE — Assessment & Plan Note (Signed)
 Feelings of being bloated for several months, KUB in July revealed moderate stool- will add miralax  17 gm daily at this time.

## 2023-09-15 NOTE — Assessment & Plan Note (Signed)
 Continues on foley, reports functioning well at this time. Continues routine flushes

## 2023-09-15 NOTE — Assessment & Plan Note (Signed)
 Has improved, now bp on higher side and staff holding midodrine .  Will continue midodrine  5 mg TID PRN

## 2023-09-15 NOTE — Assessment & Plan Note (Signed)
 Partial post cervical spine fracture Continues supportive care.

## 2023-09-15 NOTE — Assessment & Plan Note (Signed)
 Stable on gabapentin  at bedtime

## 2023-09-16 LAB — URINE CULTURE

## 2023-09-19 ENCOUNTER — Ambulatory Visit: Admitting: Occupational Therapy

## 2023-09-19 ENCOUNTER — Ambulatory Visit

## 2023-09-19 LAB — BASIC METABOLIC PANEL WITH GFR
BUN: 10 (ref 4–21)
CO2: 34 — AB (ref 13–22)
Chloride: 100 (ref 99–108)
Creatinine: 0.5 (ref 0.5–1.1)
Glucose: 81
Potassium: 4.2 meq/L (ref 3.5–5.1)
Sodium: 140 (ref 137–147)

## 2023-09-19 LAB — COMPREHENSIVE METABOLIC PANEL WITH GFR
Calcium: 8.8 (ref 8.7–10.7)
eGFR: 92

## 2023-09-19 NOTE — Therapy (Incomplete)
 OUTPATIENT PHYSICAL THERAPY NEURO TREATMENT/RECERT  Patient Name: Sherry [name] MRN: [id number] DOB:[birth year], 89 y.o., female Today's Date: 09/19/2023   PCP: Sherry Brigham, MD REFERRING PROVIDER: Cleotilde [name]                    Past Medical History:  Diagnosis Date   Acute blood loss anemia    Arthritis    Cancer (HCC)    skin   Central cord syndrome at C6 level of cervical spinal cord (HCC) 11/29/2017   Hypertension    Protein-calorie malnutrition, severe (HCC) 01/24/2018   S/P insertion of IVC (inferior vena caval) filter 01/24/2018   Tetraparesis (HCC)    Past Surgical History:  Procedure Laterality Date   ANTERIOR CERVICAL DECOMP/DISCECTOMY FUSION N/A 11/29/2017   Procedure: Cervical five-six, six-seven Anterior Cervical Decompression Fusion;  Surgeon: Sherry Debby DELENA, MD;  Location: [facility] OR;  Service: Neurosurgery;  Laterality: N/A;  Cervical five-six, six-seven Anterior Cervical Decompression Fusion   CATARACT EXTRACTION     FEMUR IM NAIL Left 08/16/2021   Procedure: INTRAMEDULLARY (IM) NAIL FEMORAL;  Surgeon: Sherry Barrio, MD;  Location: [facility] ORS;  Service: Orthopedics;  Laterality: Left;   IR IVC FILTER PLMT / S&I /IMG GUID/MOD SED  12/08/2017   Patient Active Problem List   Diagnosis Date Noted   Chronic pain syndrome 08/25/2023   Orthostatic hypotension 08/25/2023   Age-related osteoporosis without current pathological fracture 07/27/2022   Mild recurrent major depression (HCC) 07/27/2022   Hypertensive heart disease with other congestive heart failure (HCC) 02/14/2022   Chronic indwelling Foley catheter 02/14/2022   Closed hip fracture (HCC) 08/15/2021   DVT (deep venous thrombosis) (HCC) 08/15/2021   Quadriplegia (HCC) 08/15/2021   Fall 08/15/2021   Constipation due to slow transit 08/31/2018   Neuropathic pain 06/05/2018   Neurogenic bowel 06/05/2018   Healthcare-associated pneumonia 01/25/2018   Chronic allergic rhinitis  01/24/2018   Depression with anxiety 01/24/2018   Protein-calorie malnutrition, severe (HCC) 01/24/2018   S/P insertion of IVC (inferior vena caval) filter 01/24/2018   Tetraparesis (HCC) 01/20/2018   Neurogenic bladder 01/20/2018   Reactive depression    Benign essential HTN    Acute postoperative anemia due to expected blood loss    Central cord syndrome at C6 level of cervical spinal cord (HCC) 11/29/2017   Allergy to alpha-gal 11/25/2016   SCC (squamous cell carcinoma) 04/08/2014    ONSET DATE: 08/15/2021 (fall with B hip fx); Initial injury was in 2019- quadraplegia due to central cord syndrome of C6 secondary to neck fracture.   REFERRING DIAG: Bilateral Hip fx; Left Open- s/p Left IM nail femoral on 08/16/2021; Right closed  THERAPY DIAG:  No diagnosis found.  Rationale for Evaluation and Treatment Rehabilitation  SUBJECTIVE:   SUBJECTIVE STATEMENT:   *** Patient reports having a good day so far- States has been seen by Cardiology and having more test this week.      Pt accompanied by: Paid caregiver and dtr-   PERTINENT HISTORY: Sherry [name] is an 89yoF who experienced a fall at home on 08/15/2021 with hip fracture, s/p Left hip ORIF. PMH: quadriplegia due to central cord syndrome of C6 secondary to neck fracture, HTN, depression with anxiety, neurogenic bladder, bilateral DVT on Xarelto  (s/p of IVC filter placement). Prior history of significant PT/OT with improvement of function. Pt has full use of shoulder/elbows, limited use of hands, adaptive self feeding sucessful.     PAIN:  None current but does endorse some left thigh pain  intermittent  OBJECTIVE:   TODAY'S TREATMENT:   09/05/23      Therex:  Passive quad stretching in seated- static hold x 2 min x 2 Active ham curl  2 x 10 reps each LE Active LAQ 2 x 15 reps each LE Eccentric LAQ x 5 each LE with 3 sec hold  Manually Resistive hip add- 2x 10 reps AAROM Hip abd 2 x 10 reps  Passive hamstring  stretching hold 30 sec x 3 each LE   PATIENT EDUCATION: Education details: Exercise technique Patient verbalized understanding.   HOME EXERCISE PROGRAM: No changes at this time  Access Code: Sherry [name] URL: [email or web address] Date: 11/23/2021 Prepared by: Sherry  [name] Exercises - Seated Gluteal Sets - 2 x daily - 7 x weekly - 2 sets - 10 reps - 5 hold - Seated Quad Set - 2 x daily - 7 x weekly - 2 sets - 10 reps - 5 hold - Seated Long Arc Quad - 2 x daily - 7 x weekly - 2 sets - 10 reps - 5 hold - Seated March - 2 x daily - 7 x weekly - 2 sets - 10 reps - 5 hold - Seated Hip Abduction - 2 x daily - 7 x weekly - 2 sets - 10 reps - 5 hold - Seated Shoulder Shrugs - 2 x daily - 7 x weekly - 2 sets - 10 reps - 5 hold - Wheelchair Pressure Relief - 2 x daily - 7 x weekly - 2 sets - 10 reps - 5 hold     GOALS: Goals reviewed with patient? Yes  SHORT TERM GOALS: Target date: 02/22/2022  Pt will be independent with initial UE strengthening HEP in order to improve strength and balance in order to decrease fall risk and improve function at home and work. Baseline: 10/19/2021- patient with no formal UE HEP; 12/02/2021= Patient verbalized knowledge of HEP including use of theraband for UE strengthening.  Goal status: GOAL MET  LONG TERM GOALS: Target date: 09/12/2023   Pt will be independent with final for UE/LE HEP in order to improve strength and balance in order to decrease fall risk and improve function at home and work. Baseline: Patient is currently BLE NWB and unable to participate in HEP. Has order for UE strengthening. 01/11/2022- Patient now able to participate in LE strengthening although NWB still- good understanding for some basic exercises. Will keep goal active to incorporate progressive LE strengthening exercises. 04/07/2022= Patient still participating in progressive LE seated HEP and has no questions at this time.  Goal status: MET  2.  Pt will improve FOTO to target score  of 35  to display perceived improvements in ability to complete ADL's.  Baseline: 10/19/2021= 12; 12/02/2021= 17; 01/11/2022=17; 04/07/2022= 17; 07/05/2022=17; 10/06/2022- outcome measure not appropriate as patient in non-ambulatory and not consistently standing. Goal status: Goal not appropriate  3.  Pt will increase strength of B UE  by at least 1/2 MMT grade in order to demonstrate improvement in strength and function  Baseline: patient has range of 2-/5 to 4/5 BUE Strength; 12/02/2021= 4/5 except with wrist ext. 01/11/2022= 4/5 B UE strength expept for wrist Goal status: Goal revised-no longer appropriate- working with OT on all UE strengthening.   4. Pt. Will increase strength of RLE by at least 1/2 MMT grade in order to demonstrate improvement in Standing/transfers.  Baseline: 2-/5 Right hip flex/knee ext/flex; 04/07/2022= 2- with right hip flex/knee ext (lacking 28 deg from zero) 4/8= Patient able  to ext right knee lacking 18 deg from zero. 07/05/2022=Left knee approx 8 deg from zero and right knee = 23 deg from zero; 12/29/2022 - Left LE = full ROM 4-/5 and right knee ext= 23 deg from zero= 2+/5 (gravity minimized); 03/28/2023= Left knee ext=  4/5 and right knee ext= 3-/5 (able to move lower leg through incomplete ROM -lacking 20 deg from full ROM against gravity); 05/23/2023= right knee ext= 3-/5 (able to move lower leg through incomplete ROM -lacking 17 deg from full ROM against gravity); 06/20/2023= Right knee ext= lacking 20 deg from zero  Goal status: Progressing  5. Pt. Will demo ability to stand pivot transfer with max assist for improved functional mobility and less dependent need on mechanical device.   Baseline: Dependent on hoyer lift for all transfers; 04/07/2022- Unable to test secondary to patient with recent UTI and right hand procedure - will attempt to reassess next visit. ; 04/12/22: Unable to successfully stand due to feet plates on loaned power w/c; 05/10/2022- Patient still having to use rental  chair and has not received her original power w/c back to practice SPT. 07/04/2021= Patient just received new power w/c and attempted standing from 1st time today- Patient able to stand with max assist today from w/c and did not attempt to perform pivot transfer- difficulty with static standing and placing weight on right LE. 10/06/2022- Not assessed today- patient needs new AFO prior to attempting transfers; 12/29/2022= Patient has not attempted SPT yet - still trying to improve LE strength enough to stand well without knee buckling with weight shifting. 03/28/2023- not attempted today due to ill fitting brace but patient last performed SPT on 01/10/23 and was able to perform with Max A with feet pre-positioned to angle toward direction of transfer; 05/23/2023- Not tested today but on last visit- patient able to perform SPT with max assist yet concern for twisting knee as she was unable to turn/pivot foot .  06/20/2023- deferred to next session due to patient fatigued with static standing today.  Goal status: ONGOING  6. Pt. Will demonstrate improved functional LE strength as seen by ability to stand > 2 min for improved transfer ability and pregait abilities.   Baseline: not assessed today due to recent dx: UTI; 04/07/2022- Unable to test secondary to patient with recent UTI and right hand procedure - will attempt to reassess next visit; 04/12/22: Unable to successfully stand due to feet plates on loaned power w/c 05/10/2022- Patient still having to use rental chair and has not received her original power w/c back to practice Standing. 07/05/2022- Patient able to stand with max assist at trunk today for 2 min 3 sec. Will keep goal active to ensure consistency. 10/06/2022= Unable to assess today- patient with some recent blood pressure issues and needs new AFO. 11/24/2022= Patient demonstrated multiple rounds of standing in // bars- holding up to 1:45 min today but has been able to exhibit 2 min in recent past- working on  consistency. 12/29/2022= 2 min 40 sec with patient demonstrating improved overall posture and ability to contract glutes and quads with just min assist at trunk today.   Goal status: MET  7. Pt. Will demonstrate improved functional LE strength as seen by ability to stand > 5 min for improved transfer ability and pregait abilities. Baseline: 12/29/2022= 2 min 40 sec with patient demonstrating improved overall posture and ability to contract glutes and quads with just min assist at trunk today. 03/28/2023= Attempted 2 trials today - at  best 2 min 48 sec today but during cert- did stand for 3 min 44 sec. 06/20/2023= Patient able to stand 2x (45 sec and 1 min 12 sec)- max assist yet no slippage of LLE.  07/13/2023= Patient able to stand x 3 trials today- each 1-2 min focusing on posture- patient with improving gluteal activation and less physical assist once in standing position. Goal Status: Ongoing  8. Patient will perform sit to stand transfer with moderate assist consistently > 75% of time for improved transfer ability and less dependence on caregiver.   Baseline: 12/29/2022- Patient currently max assist level assistance with all sit to stand activities. 03/28/2023= Patient is still majority of max assist to stand yet able to pull up from wheelchair smoother in one motion. 06/20/2023- Max Assist but has only most recently resumed standing since last week so goal still appropriate for new cert; 3/88/7974=dupoo max assist but less overall assist on last trial of standing with improved forward lean and gluteal activation.   Goal status: ONGOING   ASSESSMENT:  CLINICAL IMPRESSION:   Treatment was modified to just some seated LE strengthening secondary to patient reporting having some cardiac issues and going to have some more test this week. Will hold on transfer/standing til receive some update from cardiology. Keep the intensity at moderate- modifiying her reps and activities as appropriate . Pt will  continue to benefit from skilled PT services to optimize independence and reduced caregiver support.   OBJECTIVE IMPAIRMENTS Abnormal gait, decreased activity tolerance, decreased balance, decreased coordination, decreased endurance, decreased mobility, difficulty walking, decreased ROM, decreased strength, hypomobility, impaired flexibility, impaired UE functional use, postural dysfunction, and pain.   ACTIVITY LIMITATIONS carrying, lifting, bending, sitting, standing, squatting, sleeping, stairs, transfers, bed mobility, continence, bathing, toileting, dressing, self feeding, reach over head, hygiene/grooming, and caring for others  PARTICIPATION LIMITATIONS: meal prep, cleaning, laundry, medication management, personal finances, interpersonal relationship, driving, shopping, community activity, and yard work  PERSONAL FACTORS Age, Time since onset of injury/illness/exacerbation, and 1-2 comorbidities: HTN, cervical Sx are also affecting patient's functional outcome.   REHAB POTENTIAL: Good  CLINICAL DECISION MAKING: Evolving/moderate complexity  EVALUATION COMPLEXITY: Moderate  PLAN: PT FREQUENCY: 1-2x/week  PT DURATION: 12 weeks  PLANNED INTERVENTIONS: Therapeutic exercises, Therapeutic activity, Neuromuscular re-education, Balance training, Gait training, Patient/Family education, Self Care, Joint mobilization, DME instructions, Dry Needling, Electrical stimulation, Wheelchair mobility training, Spinal mobilization, Cryotherapy, Moist heat, and Manual therapy  PLAN FOR NEXT SESSION:   Continue with LE and core strength training  Sit to stand and static standing trials to promote improved standing. SPT as able.     8:26 AM, 09/19/23  8:26 AM, 09/19/23 Chyrl London, PT Physical Therapist - [place] [facility]  Outpatient Physical Therapy- Main [facility] [phone]

## 2023-09-20 ENCOUNTER — Non-Acute Institutional Stay (SKILLED_NURSING_FACILITY): Payer: Self-pay | Admitting: Nurse Practitioner

## 2023-09-20 ENCOUNTER — Encounter: Payer: Self-pay | Admitting: Nurse Practitioner

## 2023-09-20 DIAGNOSIS — Z0000 Encounter for general adult medical examination without abnormal findings: Secondary | ICD-10-CM | POA: Diagnosis not present

## 2023-09-20 NOTE — Progress Notes (Signed)
 Subjective:   Sherry [name] is a 89 y.o. female who presents for Medicare Annual (Subsequent) preventive examination.  Visit Complete: In person SNF TL   Cardiac Risk Factors include: advanced age (>61men, >23 women);sedentary lifestyle     Objective:    Today's Vitals   09/20/23 0907 09/20/23 0915  BP: (!) 164/84 (!) 145/84  Pulse: 68   Resp: 18   Temp: 98 F (36.7 C)   SpO2: 95%   Weight: 195 lb 6.4 oz (88.6 kg)   Height: 5' 7 (1.702 m)    Body mass index is 30.6 kg/m.     09/14/2023    1:55 PM 08/25/2023    9:03 AM 03/09/2023   12:39 PM 01/18/2023   12:27 PM 11/17/2022    1:26 PM 11/10/2022   10:09 AM 10/29/2022    4:35 PM  Advanced Directives  Does Patient Have a Medical Advance Directive? No;Yes Yes Yes Yes Yes Yes Yes  Type of Advance Directive Out of facility DNR (pink MOST or yellow form) Healthcare Power of [place];Living will;Out of facility DNR (pink MOST or yellow form) Healthcare Power of [place];Out of facility DNR (pink MOST or yellow form);Living will Healthcare Power of [place];Out of facility DNR (pink MOST or yellow form);Living will Healthcare Power of [place];Out of facility DNR (pink MOST or yellow form);Living will Healthcare Power of [place];Out of facility DNR (pink MOST or yellow form);Living will Healthcare Power of [place];Out of facility DNR (pink MOST or yellow form);Living will  Does patient want to make changes to medical advance directive?  No - Patient declined No - Patient declined No - Patient declined No - Patient declined No - Patient declined No - Patient declined  Copy of Healthcare Power of Attorney in Chart?  Yes - validated most recent copy scanned in chart (See row information) Yes - validated most recent copy scanned in chart (See row information) Yes - validated most recent copy scanned in chart (See row information) Yes - validated most recent copy scanned in chart (See row information) Yes - validated most recent copy  scanned in chart (See row information) Yes - validated most recent copy scanned in chart (See row information)    Current Medications (verified) Outpatient Encounter Medications as of 09/20/2023  Medication Sig   acetaminophen  (TYLENOL ) 500 MG tablet Take 500 mg by mouth every 12 (twelve) hours as needed for mild pain. (Patient taking differently: Take 1,000 mg by mouth 3 (three) times daily.)   acetic acid 0.25 % irrigation Use 30ml via irrigation twice daily to keep foley Cath patent.   alendronate (FOSAMAX) 70 MG tablet Take 70 mg by mouth every Saturday.   calcium  carbonate (CALCIUM  600) 600 MG TABS tablet Take 600 mg by mouth at bedtime.   Capsaicin  0.1 % CREA Apply 1 application  topically every evening. (Apply to hands)   cetirizine (ZYRTEC) 5 MG tablet Take 5 mg by mouth daily.   cholecalciferol  (VITAMIN D3) 25 MCG (1000 UNIT) tablet Take 1,000 Units by mouth daily.   DULoxetine  (CYMBALTA ) 60 MG capsule Take 60 mg by mouth daily.   ferrous sulfate  325 (65 FE) MG tablet Take 325 mg by mouth daily with breakfast.   fluocinonide (LIDEX) 0.05 % external solution Apply 1 Application topically every 12 (twelve) hours as needed.   furosemide (LASIX) 20 MG tablet Take 20 mg by mouth daily.   gabapentin  (NEURONTIN ) 100 MG capsule Take 1 capsule (100 mg total) by mouth at bedtime.   hydroxypropyl methylcellulose /  hypromellose (ISOPTO TEARS / GONIOVISC) 2.5 % ophthalmic solution Place 1 drop into both eyes every hour as needed for dry eyes.   ketoconazole (NIZORAL) 2 % shampoo Apply 1 Application topically as directed. Every shift on Thursday for seborrheic dermatitis flare up   lactulose , encephalopathy, (CHRONULAC ) 10 GM/15ML SOLN Take 30 mLs by mouth daily as needed (mild constipation).   magnesium  citrate solution Take 60-120 mLs by mouth daily as needed (constipation).   Menthol , Topical Analgesic, (BIOFREEZE COOL THE PAIN) 4 % GEL Apply 1 application  topically every 8 (eight) hours as  needed (for left knee pain).   methylcellulose oral powder Take 1 packet by mouth at bedtime.   midodrine  (PROAMATINE ) 10 MG tablet Take 0.5 tablets (5 mg total) by mouth 3 (three) times daily as needed (Blood Pressure <90/60).   Multiple Vitamin (MULTIVITAMIN WITH MINERALS) TABS tablet Take 1 tablet by mouth daily.   olopatadine (PATADAY) 0.1 % ophthalmic solution Place 1 drop into both eyes 2 (two) times daily.   ondansetron  (ZOFRAN ) 4 MG tablet Take 4 mg by mouth every 6 (six) hours as needed for nausea or vomiting.   polyethylene glycol (MIRALAX  / GLYCOLAX ) 17 g packet Take 17 g by mouth daily as needed.   rivaroxaban  (XARELTO ) 10 MG TABS tablet Take 10 mg by mouth daily.   saccharomyces boulardii (FLORASTOR) 250 MG capsule Take 250 mg by mouth daily.   Saline Spray 0.2 % SOLN Place 2 sprays into both nostrils as needed (nasal congestion).   simethicone  (MYLICON) 80 MG chewable tablet Chew 80 mg by mouth every 6 (six) hours as needed for flatulence.   sodium phosphate  (FLEET) 7-19 GM/118ML ENEM Place 1 enema rectally every other day as needed for severe constipation.   tiZANidine  (ZANAFLEX ) 2 MG tablet Take 1 tablet (2 mg total) by mouth 2 (two) times daily.   triamcinolone  cream (KENALOG ) 0.1 % Apply to thigh and lower legs topically every 8 hours as needed. Apply to back of neck topically as needed.   No facility-administered encounter medications on file as of 09/20/2023.    Allergies (verified) Other, Lisinopril-hydrochlorothiazide, Beef-derived drug products, Dairy aid [tilactase], Lisinopril-hydrochlorothiazide, and Pork-derived products   History: Past Medical History:  Diagnosis Date   Acute blood loss anemia    Arthritis    Cancer (HCC)    skin   Central cord syndrome at C6 level of cervical spinal cord (HCC) 11/29/2017   Hypertension    Protein-calorie malnutrition, severe (HCC) 01/24/2018   S/P insertion of IVC (inferior vena caval) filter 01/24/2018   Tetraparesis (HCC)     Past Surgical History:  Procedure Laterality Date   ANTERIOR CERVICAL DECOMP/DISCECTOMY FUSION N/A 11/29/2017   Procedure: Cervical five-six, six-seven Anterior Cervical Decompression Fusion;  Surgeon: Cheryle Debby LABOR, MD;  Location: [facility] OR;  Service: Neurosurgery;  Laterality: N/A;  Cervical five-six, six-seven Anterior Cervical Decompression Fusion   CATARACT EXTRACTION     FEMUR IM NAIL Left 08/16/2021   Procedure: INTRAMEDULLARY (IM) NAIL FEMORAL;  Surgeon: Cleotilde Barrio, MD;  Location: [facility] ORS;  Service: Orthopedics;  Laterality: Left;   IR IVC FILTER PLMT / S&I /IMG GUID/MOD SED  12/08/2017   Family History  Problem Relation Age of Onset   Breast cancer Maternal Aunt 4   Social History   Socioeconomic History   Marital status: Married    Spouse name: Not on file   Number of children: Not on file   Years of education: Not on file   Highest education  level: Not on file  Occupational History   Not on file  Tobacco Use   Smoking status: Never   Smokeless tobacco: Never  Vaping Use   Vaping status: Never Used  Substance and Sexual Activity   Alcohol  use: Never   Drug use: Never   Sexual activity: Not on file  Other Topics Concern   Not on file  Social History Narrative   ** Merged History Encounter **       Social Drivers of Health   Financial Resource Strain: Low Risk  (08/31/2023)   Received from [facility]   Overall Financial Resource Strain (CARDIA)    Difficulty of Paying Living Expenses: Not very hard  Food Insecurity: No Food Insecurity (08/31/2023)   Received from [facility]   Hunger Vital Sign    Within the past 12 months, you worried that your food would run out before you got the money to buy more.: Never true    Within the past 12 months, the food you bought just didn't last and you didn't have money to get more.: Never true  Transportation Needs: No Transportation Needs (08/31/2023)   [facility] - Transportation    In the past 12 months, has lack of transportation kept you from medical appointments or from getting medications?: No    Lack of Transportation (Non-Medical): No  Physical Activity: Not on file  Stress: Not on file  Social Connections: Not on file    Tobacco Counseling Counseling given: Not Answered   Clinical Intake:  Pre-visit preparation completed: Yes  Pain : No/denies pain     BMI - recorded: 30 Nutritional Status: BMI > 30  Obese Diabetes: No  How often do you need to have someone help you when you read instructions, pamphlets, or other written materials from your doctor or pharmacy?: 1 - Never         Activities of Daily Living    09/20/2023    9:11 AM  In your present state of health, do you have any difficulty performing the following activities:  Hearing? 0  Vision? 0  Difficulty concentrating or making decisions? 0  Walking or climbing stairs? 1  Dressing or bathing? 1  Doing errands, shopping? 1  Preparing Food and eating ? Y  Using the Toilet? Y  In the past six months, have you accidently leaked urine? Y  Do you have problems with loss of bowel control? Y  Managing your Medications? Y  Managing your Finances? Y  Housekeeping or managing your Housekeeping? Y    Patient Care Team: Abdul Fine, MD as PCP - General (Family Medicine)  Indicate any recent Medical Services you may have received from other than [facility] providers in the past year (date may be approximate).     Assessment:   This is a routine wellness examination for Torianne.  Hearing/Vision screen No results found.   Goals Addressed   None    Depression Screen    09/20/2023    9:15 AM 03/29/2022   10:12 AM  PHQ 2/9 Scores  PHQ - 2 Score 0 0    Fall Risk    09/20/2023    9:15 AM 08/31/2022    2:42 PM 03/29/2022   10:11 AM 02/22/2018    2:19 PM  Fall Risk   Falls in the past year? 0 0 0 Exclusion - non ambulatory    Comment    hoyer lift  Number falls in past yr:  0 0   Injury with Fall?   0   Risk for fall due to :   No Fall Risks      Data saved with a previous flowsheet row definition    MEDICARE RISK AT HOME: Medicare Risk at Home Any stairs in or around the home?: No Home free of loose throw rugs in walkways, pet beds, electrical cords, etc?: Yes Adequate lighting in your home to reduce risk of falls?: Yes Life alert?: No Use of a cane, walker or w/c?: Yes Grab bars in the bathroom?: Yes Shower chair or bench in shower?: Yes Elevated toilet seat or a handicapped toilet?: Yes  TIMED UP AND GO:  Was the test performed?  No    Cognitive Function:        Immunizations Immunization History  Administered Date(s) Administered   Influenza, High Dose Seasonal PF 11/17/2021   Influenza-Unspecified 11/14/2018, 11/20/2019, 11/17/2020, 11/17/2021, 11/24/2022   Moderna Covid-19 Fall Seasonal Vaccine 65yrs & older 05/11/2022, 05/13/2023   Moderna Covid-19 Vaccine Bivalent Booster 48yrs & up 06/30/2021, 12/11/2021, 10/29/2022   Moderna Sars-Covid-2 Vaccination 02/09/2019, 03/09/2019, 12/14/2019, 06/20/2020, 10/24/2020   PNEUMOCOCCAL CONJUGATE-20 10/05/2022   PPD Test 05/16/2019   Tdap 11/29/2017   Zoster Recombinant(Shingrix) 09/02/2022, 12/04/2022    TDAP status: Up to date  Flu Vaccine status: Due, Education has been provided regarding the importance of this vaccine. Advised may receive this vaccine at local pharmacy or Health Dept. Aware to provide a copy of the vaccination record if obtained from local pharmacy or Health Dept. Verbalized acceptance and understanding.  Pneumococcal vaccine status: Up to date  Covid-19 vaccine status: Information provided on how to obtain vaccines.   Qualifies for Shingles Vaccine? Yes   Zostavax completed No   Shingrix Completed?: Yes  Screening Tests Health Maintenance  Topic Date Due   INFLUENZA VACCINE  09/02/2023   COVID-19 Vaccine (11 -  Moderna risk 2024-25 season) 11/12/2023   Medicare Annual Wellness (AWV)  09/19/2024   DTaP/Tdap/Td (2 - Td or Tdap) 11/30/2027   Pneumococcal Vaccine: 50+ Years  Completed   Zoster Vaccines- Shingrix  Completed   HPV VACCINES  Aged Out   Meningococcal B Vaccine  Aged Out   Pneumococcal Vaccine  Discontinued   DEXA SCAN  Discontinued    Health Maintenance  Health Maintenance Due  Topic Date Due   INFLUENZA VACCINE  09/02/2023    Colorectal cancer screening: No longer required.     Lung Cancer Screening: (Low Dose CT Chest recommended if Age 13-80 years, 20 pack-year currently smoking OR have quit w/in 15years.) does not qualify.   Lung Cancer Screening Referral: na  Additional Screening:  Hepatitis C Screening: does not qualify  Vision Screening: Recommended annual ophthalmology exams for early detection of glaucoma and other disorders of the eye. Is the patient up to date with their annual eye exam?  Yes  Who is the provider or what is the name of the office in which the patient attends annual eye exams? [facility] If pt is not established with a provider, would they like to be referred to a provider to establish care? No .   Dental Screening: Recommended annual dental exams for proper oral hygiene   Community Resource Referral / Chronic Care Management: CRR required this visit?  No   CCM required this visit?  No     Plan:     I have personally reviewed and noted the following in the patient's  chart:   Medical and social history Use of alcohol , tobacco or illicit drugs  Current medications and supplements including opioid prescriptions. Patient is not currently taking opioid prescriptions. Functional ability and status Nutritional status Physical activity Advanced directives List of other physicians Hospitalizations, surgeries, and ER visits in previous 12 months Vitals Screenings to include cognitive, depression, and falls Referrals and  appointments  In addition, I have reviewed and discussed with patient certain preventive protocols, quality metrics, and best practice recommendations. A written personalized care plan for preventive services as well as general preventive health recommendations were provided to patient.     Harlene MARLA An, NP   09/20/2023

## 2023-09-21 ENCOUNTER — Ambulatory Visit

## 2023-09-21 ENCOUNTER — Ambulatory Visit: Admitting: Occupational Therapy

## 2023-09-21 DIAGNOSIS — R278 Other lack of coordination: Secondary | ICD-10-CM

## 2023-09-21 DIAGNOSIS — M6281 Muscle weakness (generalized): Secondary | ICD-10-CM

## 2023-09-21 DIAGNOSIS — R2681 Unsteadiness on feet: Secondary | ICD-10-CM

## 2023-09-21 DIAGNOSIS — S72002A Fracture of unspecified part of neck of left femur, initial encounter for closed fracture: Secondary | ICD-10-CM

## 2023-09-21 DIAGNOSIS — R2689 Other abnormalities of gait and mobility: Secondary | ICD-10-CM

## 2023-09-21 DIAGNOSIS — R262 Difficulty in walking, not elsewhere classified: Secondary | ICD-10-CM

## 2023-09-21 NOTE — Therapy (Addendum)
 Occupational Therapy Treatment Note  Patient Name: Sherry [name] MRN: [id number] DOB:November 23, 1934, 89 y.o., female Today's Date: 11/29/2022  PCP:  [facility] PROVIDER: Abdul Fine, MD  END OF SESSION:   OT End of Session - 09/21/23 1658     Visit Number 136    Number of Visits 192    Date for OT Re-Evaluation 10/05/23    Authorization Time Period    OT Start Time 1100    OT Stop Time 1145    OT Time Calculation (min) 45 min    Equipment Utilized During Treatment powered wheelchair    Activity Tolerance Patient tolerated treatment well    Behavior During Therapy WFL for tasks assessed/performed                          Past Medical History:  Diagnosis Date   Acute blood loss anemia    Arthritis    Cancer (HCC)    skin   Central cord syndrome at C6 level of cervical spinal cord (HCC) 11/29/2017   Hypertension    Protein-calorie malnutrition, severe (HCC) 01/24/2018   S/P insertion of IVC (inferior vena caval) filter 01/24/2018   Tetraparesis (HCC)    Past Surgical History:  Procedure Laterality Date   ANTERIOR CERVICAL DECOMP/DISCECTOMY FUSION N/A 11/29/2017   Procedure: Cervical five-six, six-seven Anterior Cervical Decompression Fusion;  Surgeon: Cheryle Debby DELENA, MD;  Location: [facility] OR;  Service: Neurosurgery;  Laterality: N/A;  Cervical five-six, six-seven Anterior Cervical Decompression Fusion   CATARACT EXTRACTION     FEMUR IM NAIL Left 08/16/2021   Procedure: INTRAMEDULLARY (IM) NAIL FEMORAL;  Surgeon: Cleotilde Barrio, MD;  Location: [facility] ORS;  Service: Orthopedics;  Laterality: Left;   IR IVC FILTER PLMT / S&I /IMG GUID/MOD SED  12/08/2017   Patient Active Problem List   Diagnosis Date Noted   Age-related osteoporosis without current pathological fracture 07/27/2022   Mild recurrent major depression (HCC) 07/27/2022   Hypertensive heart disease with other congestive heart failure (HCC) 02/14/2022   Chronic indwelling Foley catheter  02/14/2022   Closed hip fracture (HCC) 08/15/2021   DVT (deep venous thrombosis) (HCC) 08/15/2021   Quadriplegia (HCC) 08/15/2021   Fall 08/15/2021   Constipation due to slow transit 08/31/2018   Trauma 06/05/2018   Neuropathic pain 06/05/2018   Neurogenic bowel 06/05/2018   Vaginal yeast infection 01/30/2018   Healthcare-associated pneumonia 01/25/2018   Chronic allergic rhinitis 01/24/2018   Depression with anxiety 01/24/2018   UTI due to Klebsiella species 01/24/2018   Protein-calorie malnutrition, severe (HCC) 01/24/2018   S/P insertion of IVC (inferior vena caval) filter 01/24/2018   Tetraparesis (HCC) 01/20/2018   Neurogenic bladder 01/20/2018   Reactive depression    Benign essential HTN    Acute postoperative anemia due to expected blood loss    Central cord syndrome at C6 level of cervical spinal cord (HCC) 11/29/2017   Allergy to alpha-gal 11/25/2016   SCC (squamous cell carcinoma) 04/08/2014   ONSET DATE: 11/29/2017  REFERRING DIAG: Central Cord Syndrome at C6 of the Cervical Spinal Cord, Fall with Bilateral Closed Hip Fractures, with ORIF repair with Intramedullary Nailing of the Left Hip Fracture.   THERAPY DIAG:  Muscle weakness (generalized)  Other lack of coordination  Rationale for Evaluation and Treatment: Rehabilitation  SUBJECTIVE:  SUBJECTIVE STATEMENT:  Pt. Reports that she has an appointment for an Echocardiogram this afternoon.  Pt accompanied by: self, Personal Care aide  PERTINENT HISTORY: Central Cord Syndrome at C6  of the Cervical Spinal Cord, Fall with Bilateral Closed Hip Fractures, with ORIF repair with Intramedullary Nailing of the Left Hip Fracture.   PRECAUTIONS: None  WEIGHT BEARING RESTRICTIONS: No  PAIN:  Are you having pain? No pain   LIVING ENVIRONMENT: Lives with: [facility] Stairs: No Has following equipment at home: Wheelchair (power)  PLOF: Independent   PATIENT GOALS:  See below for established  goals  OBJECTIVE:  Note: Objective measures were completed at Evaluation unless otherwise noted.  HAND DOMINANCE: Right  ADLs: Caregiver assist with ADLs, and [facility]  MOBILITY STATUS: Uses a power w/c  ACTIVITY TOLERANCE: Activity tolerance: Fair  FUNCTIONAL OUTCOME MEASURES:  Measurements:   12/09/2021:   Shoulder flexion: R: 120(136), L: 138(150) Shoulder abduction: R: 108(120), L: 110(120) Elbow: R: 0-148, L: 0-146 Wrist flexion: R: 55(62), L: 78 Writ extension: R: 34(55), L: 3(4) RD: R: 12(20), L: 16(26) UD: R: 8(24), L: 6(18) Thumb radial abduction: R: 11(20), L: 8(20) Digit flexion to the [facility]: R:  2nd: 5cm(3.5cm), 3rd: 7.5 cm(5cm), 4th: 7cm(3cm), 5th: 6cm(5.5cm) L: 2nd: 8cm(8cm), 3rd: 8cm(8cm), 4th: 7.5cm(7cm), 5th: 7cm(7cm)   02/03/2022:   Shoulder flexion: R: 122(138), L: 148(154) Shoulder abduction: R: 109(125), L: 125(125 Elbow: R: 0-148, L: 0-146 Wrist flexion: R: 60(68), L: 79 Writ extension: R: 40(55), L: 3(10) RD: R: 14(24), L: 20(28) UD: R: 8(24), L: 6(18) Thumb radial abduction: R: 20(25), L: 8(20) Digit flexion to the [facility]: R:  2nd: 4.5cm(3cm), 3rd: 6.5cm(4.5cm), 4th: 6.5 cm(3cm), 5th: 4.5cm(5cm) L: 2nd: 8cm(8cm), 3rd: 8cm(7cm), 4th: 7.5cm(7cm), 5th: 6.5cm(6.5cm)   04/07/2022:   Shoulder flexion: R: 125(150), L: 160(160) Shoulder abduction: R: 109(125), L: 125(125 Elbow: R: 0-148, L: 0-146 Wrist flexion: R: 60(68), L: 79 Writ extension: R: 40(55), L: 5(18) RD: R: 14(24), L: 20(28) UD: R: 18(24), L: 8(18) Thumb radial abduction: R: 20(25), L: 8(20) Digit flexion to the [facility]: R:  2nd: 4.5 cm(4 cm), 3rd: 6.5cm(3cm), 4th: 7 cm (5cm), 5th: 5cm(5cm) L: 2nd: 8cm(8cm), 3rd: 8cm(7cm), 4th: 7.5cm(7cm), 5th: 7cm(7cm)   07/05/2022:   Shoulder flexion: R: 125(154), L: 160(160) Shoulder abduction: R: 95(130), L: 143(143) Elbow: R: 0-148, L: 0-146 Wrist flexion: R: 60(68), L: 79 Writ extension: R: 40(60), L: 0(12) RD: R: 10(24), L: 12(20) UD:  R: 16(24), L: 8(16) Thumb radial abduction: R: 20(25), L: 8(20) Digit flexion to the [facility]: R:  2nd: 4 cm(4cm), 3rd: 6.5cm( 3.5cm), 4th: 7 cm (5cm), 5th: 6cm(5cm) L: 2nd: 8cm(8cm), 3rd: 8cm(7cm), 4th: 8cm(7cm), 5th: 7cm(7cm)   09/13/2022:   Shoulder flexion: R: 125(154), L: 160(160) Shoulder abduction: R: 100(130), L: 143(143) Elbow: R: 0-150, L: 0-150 Wrist flexion: R: 55(68), L: 79 Writ extension: R: 45(60), L: 0(12) RD: R: 10(24), L: 12(20) UD: R: 16(24), L: 8(16) Thumb radial abduction: R: 20(25), L: 8(20) Digit flexion to the [facility]: R:  2nd: 4 cm(4cm), 3rd: 6cm( 5cm), 4th: 6 cm (4cm), 5th: 5cm(5cm) L: 2nd: 7cm(cm), 3rd: 7cm(6cm), 4th: 7cm(6cm), 5th: 6cm(6cm)   10/27/2022   Shoulder flexion: R: 128(154), L: 160(160) Shoulder abduction: R: 105(130), L: 143(143) Elbow: R: 0-150, L: 0-150 Wrist flexion: R: 55(68), L: 79 Writ extension: R: 46(64), L: 0(20) RD: R: 10(20), L: 18(24) UD: R: 18(28), L: 8(20) Thumb radial abduction: R: 30(42), L:20(24) Digit flexion to the [facility]: R:  2nd: 4 cm(3cm), 3rd: 6cm( 6cm), 4th: 6 cm (5cm), 5th: 5cm(5cm) L: 2nd: 8cm(8cm), 3rd: 8cm(6cm), 4th: 8cm(7cm), 5th: 6cm(6cm)  01/19/2023  Shoulder flexion: R: 131(154), L: 160(160) Shoulder abduction: R:  110(130), L: 143(143) Elbow: R: 0-150, L: 0-150 Wrist flexion: R: 55(68), L: 79 Writ extension: R: 46(64), L: 0(20) RD: R: 10(20), L: 18(24) UD: R: 18(28), L: 14(20) Thumb radial abduction: R: 38(48), L:24(26) Thumb IP flexion: R: 35(50) L: 20(45) Digit flexion to the [facility]: R:  2nd: 3.5 cm(3cm), 3rd: 6cm( 6cm), 4th: 6 cm (5cm), 5th: 5cm(4.5cm) L: 2nd: 8cm(7.5cm), 3rd: 8cm(6cm), 4th: 7.5cm(6cm), 5th: 6cm(4.5cm)  04/04/2023  Shoulder flexion: R: 134(154), L: 160(160) Shoulder abduction: R: 114(130), L: 143(143) Elbow: R: WNL, L: WNL Wrist flexion: R: 55(68), L: 80 Writ extension: R: 46(64), L: -10(10) RD: R: 14(24), L: 20(24) UD: R: 20(30), L: 14(20) Thumb radial abduction: R: 38(48),  L:28(30) Thumb IP flexion: R: 40(50) L: 25(45) Digit flexion to the [facility]: R:  2nd: 3.5 cm(3cm), 3rd: 6cm( 5cm), 4th: 6 cm (4cm), 5th: 5cm(5cm) L: 2nd: 8cm(7.5cm), 3rd: 8cm(7cm), 4th: 7.5cm(7cm), 5th: 7cm(6cm)  05/25/2023  Shoulder flexion: R: 134(154), L: 160(160) Shoulder abduction: R: 117(134), L: 143(143) Elbow: R: WNL, L: WNL Wrist flexion: R: 55(68), L: 80 Wrist extension: R: 46(64), L: -8(14) RD: R: 14(24), L: 20(24) UD: R: 20(32), L: 14(22) Thumb radial abduction: R: 38(50), L:28(30) Thumb IP flexion: R: 40(50) L: 30(45) Digit flexion to the [facility]: R:  2nd: 3.5 cm(3cm), 3rd: 6cm( 5cm), 4th: 7 cm (4cm), 5th: 5cm(4cm) L: 2nd: 8cm(7.5cm), 3rd: 8cm(7cm), 4th: 7.5cm(7cm), 5th: 7cm(6cm)  07/13/23  Shoulder flexion: R: 134(154), L: 160(160) Shoulder abduction: R: 122(134), L: 143(143) Elbow: R: WNL, L: WNL Wrist flexion: R: 55(68), L: 80 Wrist extension: R: 50(64), L: -4(18) RD: R: 18(24), L: 24(24) UD: R: 20(32), L: 14(22) Thumb radial abduction: R: 38(50), L:3230) Thumb IP flexion: R: 42(50) L: 30(45) Digit flexion to the [facility]: R:  2nd: 3.5 cm(3cm), 3rd: 6cm( 5cm), 4th: 7 cm (4cm), 5th: 5cm(4cm) L: 2nd: 8cm(7.5cm), 3rd: 8cm(7cm), 4th: 7.5cm(7cm), 5th: 7cm(6cm)   COORDINATION:   01/19/2023:  9 hole peg test:  Right: 2 pegs placed in 1 min. & 5 sec.  Pt. was able to remove 9 pegs in 17 sec.  Left: 2 pegs placed in 1 min. & 10 sec. Pt. was able to remove 9 pegs in 20 sec.  04/04/23:  Pt. was able to consistently grasp the pegs however each one slipped out of her fingers when attempting to place them into the Pegboard.  05/25/2023:   Right: 9 pegs placed and removed in 2 min. & 41 sec. Left: Unable to grasp the pegs for a horizontal position.  07/13/23    Right: 9 pegs placed and removed in 2 min. & 11 sec.   SENSATION: Intact  EDEMA: N/A   COGNITION: Overall cognitive status: WNL   VISION: Subjective report: Wears glasses, No changes in vision.  PERCEPTION:  Intact  TODAY'S TREATMENT:  DATE:  09/21/2023        There. Ex.:  -Pt. tolerated PROM bilateral wrist extension with increased focus on the left wrist extension. PROM for bilateral digit MP, PIP, and DIP flexion, and extension, thumb abduction was performed per Pt. Request.  See Clinical Impression regarding additional treatment session information.        PATIENT EDUCATION: Education details: Bilateral hand  ROM, positioning Person educated: Patient Education method: Explanation, Demonstration, and Verbal cues Education comprehension: verbalized understanding and returned demonstration  HOME EXERCISE PROGRAM: Continue to assess HEP needs, and provide as needed.   GOALS: Goals reviewed with patient? Yes  LONG TERM GOALS: Target date: 10/05/2023  3.  Patient will independently use a reacher to pick up items from various surfaces.  Baseline: 07/13/2023: Pt. Is starting to use a reacher to retrieve items with an adaptation/modification to the trigger.05/25/2023:  Pt. is now able to initiate grasping a reacher to open and close the reacher with the right hand, however requires the grasp handle to be repositioned  within the hand. Pt. requires work on using/moving the reacher with the right hand through various planes. 04/04/2023: Pt. continues to work on improving Digit flexion to the [facility] to be able to efficiently handle Adaptive equipment. 01/19/2023: Pt. Is improving with Bilateral digit flexion to the [facility]. Pt. has difficulty firmly holding adaptive devices. 12/06/2022: Pt. continues to present with increased MP, PIP, and DIP digit tightness/stiffness limiting the formulation of bilateral composites fists. 10/27/2022: Pt. presents with increased MP, PIP, and DIP digit tightness/stiffness limiting the formulation of bilateral composites fists during this progress  reporting period limiting. Digit flexion to the [facility]: R:  2nd: 4 cm(3cm), 3rd: 6cm( 6cm), 4th: 6 cm (5cm), 5th: 5cm(5cm), L: 2nd: 8cm(8cm), 3rd: 8cm(6cm), 4th: 8cm(7cm), 5th: 6cm(6cm) 09/20/2022: Improving digit flexion to the [facility]. 09/13/22: improved digit flexion to the [facility]. 07/05/2022: Pt. presents with digit MP, PIP, and DIP extensor tightness limiting her ability to securely grip objects in her bilateral hands.  04/07/2022: Pt. Has improved with right 2nd, and 3rd digit flexion towards the [facility] Pt. Continues to have difficulty securely holding and applying deodorant.02/03/2022: Pt. Has improved with digit flexion, however, continues to have difficulty securely holding and using deodorant. 12/09/2021: Bilateral hand/digit MP, PIP, and DIP extension tightness limits her ability achieve digit flexion to hold, and apply deodorant. 10/28/2021:  Pt. continues to have difficulty holding the deodorant. 01/12/2021: Pt. Presents with limited digit extension. Pt. Is able to initiate holding deodorant, however is unable to hold it while using it  Goal status: Ongoing   4.  Pt. Will improve bilateral wrist extension in preparation for anticipating, and initiating reaching for objects at the table.  Baseline: 07/13/23:  Wrist extension: R: 50(64), L: -4(18) 05/25/23: Wrist extension: R: 46(64), L: -8(14) 04/04/23: R: 46(64) L: -10(10)01/19/2023:  R: 46(64) L: 0(20) 12/06/2022: Pt continues to to progress with bilateral wrist extension in preparation fro functional reaching. 10/27/2022: Pt. Is improving with bilateral wrist extension. R: 46(64) L: 0(20) 09/20/2022: limited PROM in the Left wrist extension 2/2 tightness/stiffness. 09/13/22: R: 55(68), L: 0(12) 07/19/2022: Bilateral wrist extension in limited. 07/05/2022: Right: 40(60) Left: 0(12) 04/07/2022: Right: 40(55) Left: 5(18) 02/03/2022: Right: 34(55) Left: 3(4) 12/09/2021: Right  34(55)  Left  3(4) 10/28/2021:  Right 22(38), Left 0(15) 09/14/2021:  Right: 17(35), left 2(15)  Goal  status: Progressing/Ongoing  7.  Pt. Will increase bilateral lateral pinch strength by 2 lbs to be able to securely grasp items during ADLs,  and IADL tasks.  Baseline: 01/19/2023: Deferred as the Pinch meter is out for calibration.12/06/2022: Pt. Is able to more securely hold objects during ADLs/IADLs 10/27/2022: Pt. Is improving with holding items with her bilateral thumbs. (Pinch meter out for calibration) 09/20/2022: lateral pinch continues to be limited 09/13/22: R 6.5# L 4# 07/19/2022: TBD 07/05/2022: TBD 07/2022: NT-Pinch meter out for calibration. 02/03/2022: Right: 6#, Left: 4# 12/09/2021: Right: 6#, Left: 4#  Goal status: Deferred   9.  Pt. will complete plant care with modified independence.  Baseline:12/06/2022: Pt. Is independent with plant care in her husband's room, however is not able to access her plants due to furniture blocking access to them. 10/27/2022: Pt. Is able to water plants that are closer, and within reach. Pt. continues to have difficulty reaching for thorough plant care. 09/20/2022: Pt. Continues to have difficulty reaching for thorough plant care. 09/13/22: continues to report intermittent difficulty 07/19/2022: Pt. Continues to be able to water, and care for some of her plants. Pt. Has more difficulty with plants that are harder to reach. 07/05/2022: pt. Is able to water, and care for some of her plants. Pt. Has more difficulty with plants that are harder to reach. 04/07/2022: Pt. is now able to hold a cup and water her plants.02/03/2022: Pt. has difficulty caring for her plants.  Goal status:Achieved   10.  Pt. will demonstrate adaptive techniques to assist with the efficiency of self-dressing, or morning care tasks.  Baseline: 07/13/23:Pt. continues to work on improving UE functioning to be able to efficiently use Adaptive equipment 05/25/2023: Pt. continues to work on improving UE functioning to be able to efficiently use Adaptive equipment.  04/04/2023:  Pt. continues to work on  improving UE functioning to be able to efficiently use Adaptive equipment. 01/19/2203: Pt. Is able to donn her jacket in reverse independently. Pt. requires assist from staff, as Pt. Reports staff have decreased time. 12/06/2022: Pt. requires assist from staff, as Pt. Reports staff have decreased time. 10/27/2022: Pt. is able to assist with initiating UE dressing. Pt. requires assist from personal, and staff care aides. 09/20/2022: Pt. continues to require assist form personal, and staff care aides. 09/13/22: MODA dressing, reports she is often rushed 07/19/2022: Pt. continues to require assist with self-dressing/morning care tasks. 07/05/2022: Continue with goal. 04/07/2022: Pt. Continues to require assist with the efficiency of self-dressing, and morning care tasks.02/03/2022: Pt. requires assist from caregivers 2/2 time limitations during morning care.  Goal status: Ongoing  11.  Pt. will improve BUE strength to improve ADL, and IADL functioning.  Baseline: 07/13/23: shoulder flexion L 4/5, R 4-/5; shoulder abduction L 4+/5, R 4-/5. elbow flexion B 5/5, elbow extension 4+/5 01/19/2023: L 4/5, R 3+/5  05/25/2023: shoulder flexion L 4/5, R 4-/5; shoulder abduction L 4+/5, R 4-/5. elbow flexion B 5/5, elbow extension 4+/5 01/19/2023: L 4/5, R 3+/5  04/04/2023: shoulder flexion L 4/5, R 3+/5; shoulder abduction L 4+/5, R 3+/5. elbow flexion B 5/5, elbow extension 4/5 01/19/2023: L 4/5, R 3+/5 12/06/2022: Continue 10/27/2022: shoulder flexion L 4/5, R 3+/5; shoulder abduction L 4+/5, R 3+/5. elbow flexion B 5/5, elbow extension 4/5 09/20/2022: Pt. Presents with limited BUE strength 09/13/22: shoulder flexion L 4/5, R 3/5; elbow flexion B 5/5, elbow extension 4-/5; shoulder abduction L 4+/5, R 3+/5. 07/19/2022: BUE strength continues to be limited. 07/05/2022: shoulder flexion: right 4-/5, abduction: 3+/5, elbow flexion: right: 5/5, left 5/5, extension: right: 4-/5, left 4-/5, wrist extension: right: 3-/5, left: 2-/5  Goal  status:  Ongoing  12.  Pt. will improve bilateral thumb radial abduction in order to be able to hold the grab bars while standing with PT  Baseline: 07/13/23: Pt. Is improving with holding the grab bars with the right hand .05/25/23: Thumb radial abduction: R: 38(50), L:28(30) 04/04/23: R: 38(48), L:28(30) 01/19/2023: R: 38(48) L: 24(26)12/06/2022: Pt. Continues to present  with limited thumb abduction, however is improving holding onto the parallel bars.10/27/2022: thumb radial abduction: R: 30(42), L: 20(24  Goal status: Ongoing  13.  Pt. Will be able to securely hold, and stabilize medication bottles at a tabletop surface when opening, and closing them.  Baseline: 01/19/2023: Pt. Is now able to securely hold medication bottles while opening them, and stabilizes them on surfaces while opening them. 12/06/2022: Pt is improving with securely stabilizing medication bottles. 10/27/2022: Pt. Stabilizes bottles against her torso when attempting to open, and close them   Goal status: Achieved  14: Pt. Will improve bilateral thumb IP flexion to improve active grasping patterns.    Baseline: 07/13/23: Thumb IP flexion: R: 42(50) L: 30(45) 05/25/2023: Thumb IP flexion: R: 40(50) L: 30(45) 04/04/2023:  R: 40(50) L: 25(45) 01/19/2023: Right 35(50), Left: 20/45)    Goal status: Ongoing    15. Pt. Will improve bilateral FMC/speed, and dexterity. As evidence by improved scores on the 9 hole peg test.  Baseline: 07/13/2023: Right: 9 pegs placed and removed in 2 min. & 11 sec. 05/25/2023: 9 Hole pegs test speed/dexterity: Right: 9 pegs placed and removed in 2 min. & 41 sec. Left: Unable to grasp the pegs for a horizontal position. 04/04/23: Pt. was able to consistently grasp the pegs however each one slipped out of her fingers when attempting to place them into the Pegboard. 01/19/2023: 2 pegs placed in 1 min. & 5 sec.  Pt. was able to remove 9 pegs in 17 sec.; Left: 2 pegs placed in 1 min. & 10 sec. Pt. was able to remove 9  pegs in 20 sec.   Goal status: Ongoing  ASSESSMENT:  CLINICAL IMPRESSION:  Pt. Reports her BP was high this morning at [facility]. Upon arrival,  BP was  assessed: BP: 91/57 , HR 75. Pt. Drank the Gatorade that she brought with her. Pt. was assisted to a reclined position with LEs with LE elevated. Symptoms were monitored. Pt.'s BP was reassessed 113/54 HR 74 bpms. Pt. reported feeling better, and wanting to participate  in the remainder of the session, however requested to focus on wrist, hand, and digit MP, PIP, DIP,  and thumb radial abduction, and IP flexion stretches today. Pt. continues to benefit from OT services to work on impoving ROM, and UE strength in order to work towards increasing bilateral hand grasp on objects, and increasing engagement of bilateral hands during ADLs, and IADL tasks.      PERFORMANCE DE PERFORMANCE DEFICITS: in functional skills including ADLs, IADLs, coordination, dexterity, ROM, strength, and UE functional use, cognitive skills including , and psychosocial skills including coping strategies and environmental adaptation.   IMPAIRMENTS: are limiting patient from ADLs, IADLs, and leisure.   CO-MORBIDITIES: may have co-morbidities  that affects occupational performance. Patient will benefit from skilled OT to address above impairments and improve overall function.  MODIFICATION OR ASSISTANCE TO COMPLETE EVALUATION: Min-Moderate modification of tasks or assist with assess necessary to complete an evaluation.  OT OCCUPATIONAL PROFILE AND HISTORY: Detailed assessment: Review of records and additional review of physical, cognitive, psychosocial history related to current functional performance.  CLINICAL DECISION  MAKING: Moderate - several treatment options, min-mod task modification necessary  REHAB POTENTIAL: Good for stated goals  PLAN:  OT FREQUENCY 2x's a week  OT DURATION: 12 weeks  PLANNED INTERVENTIONS ADL training, A/E training, UE ther. Ex, Manual  therapy, neuromuscular re-education, moist heat modality, Paraffin Bath, Splinting, and  Pt./caregiver education   RECOMMENDED OTHER SERVICES: PT  CONSULTED AND AGREED WITH PLAN OF CARE: Patient  PLAN FOR NEXT SESSION: Continue Treatment as per established POC  Richardson Otter, MS, OTR/L  09/21/23, 4:59 PM

## 2023-09-21 NOTE — Therapy (Signed)
 OUTPATIENT PHYSICAL THERAPY NEURO TREATMENT  Patient Name: Sherry [name] MRN: [id number] DOB:01-24-1935, 89 y.o., female Today's Date: 09/21/2023   PCP: Richerd Brigham, MD REFERRING PROVIDER: Cleotilde Barrio   PT End of Session - 09/21/23 1152     Visit Number 125    Number of Visits 150    Date for PT Re-Evaluation 09/12/23    Progress Note Due on Visit 130    PT Start Time 1147    PT Stop Time 1200    PT Time Calculation (min) 13 min    Equipment Utilized During Treatment --    Activity Tolerance Other (comment)   Patient not feeling well and agreeable to hold PT for 1 week until she is seen again by Cardiology.   Behavior During Therapy [facility] for tasks assessed/performed                          Past Medical History:  Diagnosis Date   Acute blood loss anemia    Arthritis    Cancer (HCC)    skin   Central cord syndrome at C6 level of cervical spinal cord (HCC) 11/29/2017   Hypertension    Protein-calorie malnutrition, severe (HCC) 01/24/2018   S/P insertion of IVC (inferior vena caval) filter 01/24/2018   Tetraparesis (HCC)    Past Surgical History:  Procedure Laterality Date   ANTERIOR CERVICAL DECOMP/DISCECTOMY FUSION N/A 11/29/2017   Procedure: Cervical five-six, six-seven Anterior Cervical Decompression Fusion;  Surgeon: Cheryle Debby DELENA, MD;  Location: [facility] OR;  Service: Neurosurgery;  Laterality: N/A;  Cervical five-six, six-seven Anterior Cervical Decompression Fusion   CATARACT EXTRACTION     FEMUR IM NAIL Left 08/16/2021   Procedure: INTRAMEDULLARY (IM) NAIL FEMORAL;  Surgeon: Cleotilde Barrio, MD;  Location: [facility] ORS;  Service: Orthopedics;  Laterality: Left;   IR IVC FILTER PLMT / S&I /IMG GUID/MOD SED  12/08/2017   Patient Active Problem List   Diagnosis Date Noted   Chronic pain syndrome 08/25/2023   Orthostatic hypotension 08/25/2023   Age-related osteoporosis without current pathological fracture 07/27/2022   Mild recurrent major  depression (HCC) 07/27/2022   Hypertensive heart disease with other congestive heart failure (HCC) 02/14/2022   Chronic indwelling Foley catheter 02/14/2022   Closed hip fracture (HCC) 08/15/2021   DVT (deep venous thrombosis) (HCC) 08/15/2021   Quadriplegia (HCC) 08/15/2021   Fall 08/15/2021   Constipation due to slow transit 08/31/2018   Neuropathic pain 06/05/2018   Neurogenic bowel 06/05/2018   Healthcare-associated pneumonia 01/25/2018   Chronic allergic rhinitis 01/24/2018   Depression with anxiety 01/24/2018   Protein-calorie malnutrition, severe (HCC) 01/24/2018   S/P insertion of IVC (inferior vena caval) filter 01/24/2018   Tetraparesis (HCC) 01/20/2018   Neurogenic bladder 01/20/2018   Reactive depression    Benign essential HTN    Acute postoperative anemia due to expected blood loss    Central cord syndrome at C6 level of cervical spinal cord (HCC) 11/29/2017   Allergy to alpha-gal 11/25/2016   SCC (squamous cell carcinoma) 04/08/2014    ONSET DATE: 08/15/2021 (fall with B hip fx); Initial injury was in 2019- quadraplegia due to central cord syndrome of C6 secondary to neck fracture.   REFERRING DIAG: Bilateral Hip fx; Left Open- s/p Left IM nail femoral on 08/16/2021; Right closed  THERAPY DIAG:  Muscle weakness (generalized)  Other lack of coordination  Other abnormalities of gait and mobility  Difficulty in walking, not elsewhere classified  Unsteadiness on feet  Closed fracture of both hips, initial encounter [facility])  Rationale for Evaluation and Treatment Rehabilitation  SUBJECTIVE:   SUBJECTIVE STATEMENT:   Patient reports she has been feeling rough - reports was in ED last week with nausea and Vomiting. States she does not return to Cardiology until next week but doesn't think she will be able to participate well and decided to hold visits while talking to PT today until she is seen by Cardiology on 09/30/2023.        Pt accompanied by: Paid caregiver  and dtr-   PERTINENT HISTORY: Sherry [name] is an 87yoF who experienced a fall at home on 08/15/2021 with hip fracture, s/p Left hip ORIF. PMH: quadriplegia due to central cord syndrome of C6 secondary to neck fracture, HTN, depression with anxiety, neurogenic bladder, bilateral DVT on Xarelto  (s/p of IVC filter placement). Prior history of significant PT/OT with improvement of function. Pt has full use of shoulder/elbows, limited use of hands, adaptive self feeding sucessful.     PAIN:  None current but does endorse some left thigh pain intermittent  OBJECTIVE:   TODAY'S TREATMENT:   09/21/23   Non- billable visit. Patient arrived to PT after completing OT session and reported just feeling rough. When discussed that today would need to be a reassessment visit. Patient requested to just cancel today's visit and hold next week until she has a chance to return to cardiology.     PATIENT EDUCATION: Education details: Exercise technique Patient verbalized understanding.   HOME EXERCISE PROGRAM: No changes at this time  Access Code: [facility] URL: [email or web address] Date: 11/23/2021 Prepared by: Marina  Moser Exercises - Seated Gluteal Sets - 2 x daily - 7 x weekly - 2 sets - 10 reps - 5 hold - Seated Quad Set - 2 x daily - 7 x weekly - 2 sets - 10 reps - 5 hold - Seated Long Arc Quad - 2 x daily - 7 x weekly - 2 sets - 10 reps - 5 hold - Seated March - 2 x daily - 7 x weekly - 2 sets - 10 reps - 5 hold - Seated Hip Abduction - 2 x daily - 7 x weekly - 2 sets - 10 reps - 5 hold - Seated Shoulder Shrugs - 2 x daily - 7 x weekly - 2 sets - 10 reps - 5 hold - Wheelchair Pressure Relief - 2 x daily - 7 x weekly - 2 sets - 10 reps - 5 hold     GOALS: Goals reviewed with patient? Yes  SHORT TERM GOALS: Target date: 02/22/2022  Pt will be independent with initial UE strengthening HEP in order to improve strength and balance in order to decrease fall risk and improve function at  home and work. Baseline: 10/19/2021- patient with no formal UE HEP; 12/02/2021= Patient verbalized knowledge of HEP including use of theraband for UE strengthening.  Goal status: GOAL MET  LONG TERM GOALS: Target date: 09/12/2023   Pt will be independent with final for UE/LE HEP in order to improve strength and balance in order to decrease fall risk and improve function at home and work. Baseline: Patient is currently BLE NWB and unable to participate in HEP. Has order for UE strengthening. 01/11/2022- Patient now able to participate in LE strengthening although NWB still- good understanding for some basic exercises. Will keep goal active to incorporate progressive LE strengthening exercises. 04/07/2022= Patient still participating in progressive LE seated HEP and has no questions at this time.  Goal status: MET  2.  Pt will improve FOTO to target score of 35  to display perceived improvements in ability to complete ADL's.  Baseline: 10/19/2021= 12; 12/02/2021= 17; 01/11/2022=17; 04/07/2022= 17; 07/05/2022=17; 10/06/2022- outcome measure not appropriate as patient in non-ambulatory and not consistently standing. Goal status: Goal not appropriate  3.  Pt will increase strength of B UE  by at least 1/2 MMT grade in order to demonstrate improvement in strength and function  Baseline: patient has range of 2-/5 to 4/5 BUE Strength; 12/02/2021= 4/5 except with wrist ext. 01/11/2022= 4/5 B UE strength expept for wrist Goal status: Goal revised-no longer appropriate- working with OT on all UE strengthening.   4. Pt. Will increase strength of RLE by at least 1/2 MMT grade in order to demonstrate improvement in Standing/transfers.  Baseline: 2-/5 Right hip flex/knee ext/flex; 04/07/2022= 2- with right hip flex/knee ext (lacking 28 deg from zero) 4/8= Patient able to ext right knee lacking 18 deg from zero. 07/05/2022=Left knee approx 8 deg from zero and right knee = 23 deg from zero; 12/29/2022 - Left LE = full ROM 4-/5 and  right knee ext= 23 deg from zero= 2+/5 (gravity minimized); 03/28/2023= Left knee ext=  4/5 and right knee ext= 3-/5 (able to move lower leg through incomplete ROM -lacking 20 deg from full ROM against gravity); 05/23/2023= right knee ext= 3-/5 (able to move lower leg through incomplete ROM -lacking 17 deg from full ROM against gravity); 06/20/2023= Right knee ext= lacking 20 deg from zero  Goal status: Progressing  5. Pt. Will demo ability to stand pivot transfer with max assist for improved functional mobility and less dependent need on mechanical device.   Baseline: Dependent on hoyer lift for all transfers; 04/07/2022- Unable to test secondary to patient with recent UTI and right hand procedure - will attempt to reassess next visit. ; 04/12/22: Unable to successfully stand due to feet plates on loaned power w/c; 05/10/2022- Patient still having to use rental chair and has not received her original power w/c back to practice SPT. 07/04/2021= Patient just received new power w/c and attempted standing from 1st time today- Patient able to stand with max assist today from w/c and did not attempt to perform pivot transfer- difficulty with static standing and placing weight on right LE. 10/06/2022- Not assessed today- patient needs new AFO prior to attempting transfers; 12/29/2022= Patient has not attempted SPT yet - still trying to improve LE strength enough to stand well without knee buckling with weight shifting. 03/28/2023- not attempted today due to ill fitting brace but patient last performed SPT on 01/10/23 and was able to perform with Max A with feet pre-positioned to angle toward direction of transfer; 05/23/2023- Not tested today but on last visit- patient able to perform SPT with max assist yet concern for twisting knee as she was unable to turn/pivot foot .  06/20/2023- deferred to next session due to patient fatigued with static standing today.  Goal status: ONGOING  6. Pt. Will demonstrate improved functional LE  strength as seen by ability to stand > 2 min for improved transfer ability and pregait abilities.   Baseline: not assessed today due to recent dx: UTI; 04/07/2022- Unable to test secondary to patient with recent UTI and right hand procedure - will attempt to reassess next visit; 04/12/22: Unable to successfully stand due to feet plates on loaned power w/c 05/10/2022- Patient still having to use rental chair and has not received her original power w/c  back to practice Standing. 07/05/2022- Patient able to stand with max assist at trunk today for 2 min 3 sec. Will keep goal active to ensure consistency. 10/06/2022= Unable to assess today- patient with some recent blood pressure issues and needs new AFO. 11/24/2022= Patient demonstrated multiple rounds of standing in // bars- holding up to 1:45 min today but has been able to exhibit 2 min in recent past- working on consistency. 12/29/2022= 2 min 40 sec with patient demonstrating improved overall posture and ability to contract glutes and quads with just min assist at trunk today.   Goal status: MET  7. Pt. Will demonstrate improved functional LE strength as seen by ability to stand > 5 min for improved transfer ability and pregait abilities. Baseline: 12/29/2022= 2 min 40 sec with patient demonstrating improved overall posture and ability to contract glutes and quads with just min assist at trunk today. 03/28/2023= Attempted 2 trials today - at best 2 min 48 sec today but during cert- did stand for 3 min 44 sec. 06/20/2023= Patient able to stand 2x (45 sec and 1 min 12 sec)- max assist yet no slippage of LLE.  07/13/2023= Patient able to stand x 3 trials today- each 1-2 min focusing on posture- patient with improving gluteal activation and less physical assist once in standing position. Goal Status: Ongoing  8. Patient will perform sit to stand transfer with moderate assist consistently > 75% of time for improved transfer ability and less dependence on caregiver.    Baseline: 12/29/2022- Patient currently max assist level assistance with all sit to stand activities. 03/28/2023= Patient is still majority of max assist to stand yet able to pull up from wheelchair smoother in one motion. 06/20/2023- Max Assist but has only most recently resumed standing since last week so goal still appropriate for new cert; 3/88/7974=dupoo max assist but less overall assist on last trial of standing with improved forward lean and gluteal activation.   Goal status: ONGOING   ASSESSMENT:  CLINICAL IMPRESSION:   Treatment/Recert visit held today as patient arrived not feeling well and requested to hold visits for next week as well until she is seen by Cardiology. Will perform recert next visit as planned and discussed with patient on 10/05/2023.  Pt will continue to benefit from skilled PT services to optimize independence and reduced caregiver support.   OBJECTIVE IMPAIRMENTS Abnormal gait, decreased activity tolerance, decreased balance, decreased coordination, decreased endurance, decreased mobility, difficulty walking, decreased ROM, decreased strength, hypomobility, impaired flexibility, impaired UE functional use, postural dysfunction, and pain.   ACTIVITY LIMITATIONS carrying, lifting, bending, sitting, standing, squatting, sleeping, stairs, transfers, bed mobility, continence, bathing, toileting, dressing, self feeding, reach over head, hygiene/grooming, and caring for others  PARTICIPATION LIMITATIONS: meal prep, cleaning, laundry, medication management, personal finances, interpersonal relationship, driving, shopping, community activity, and yard work  PERSONAL FACTORS Age, Time since onset of injury/illness/exacerbation, and 1-2 comorbidities: HTN, cervical Sx are also affecting patient's functional outcome.   REHAB POTENTIAL: Good  CLINICAL DECISION MAKING: Evolving/moderate complexity  EVALUATION COMPLEXITY: Moderate  PLAN: PT FREQUENCY: 1-2x/week  PT DURATION:  12 weeks  PLANNED INTERVENTIONS: Therapeutic exercises, Therapeutic activity, Neuromuscular re-education, Balance training, Gait training, Patient/Family education, Self Care, Joint mobilization, DME instructions, Dry Needling, Electrical stimulation, Wheelchair mobility training, Spinal mobilization, Cryotherapy, Moist heat, and Manual therapy  PLAN FOR NEXT SESSION:   RECERT NEXT VISIT Continue with LE and core strength training  Sit to stand and static standing trials to promote improved standing. SPT as able.  12:22 PM, 09/21/23  12:22 PM, 09/21/23 Chyrl London, PT Physical Therapist - [place] [facility]  Outpatient Physical Therapy- Main Campus [phone]

## 2023-09-26 ENCOUNTER — Ambulatory Visit

## 2023-09-26 ENCOUNTER — Ambulatory Visit: Admitting: Occupational Therapy

## 2023-09-28 ENCOUNTER — Ambulatory Visit: Admitting: Occupational Therapy

## 2023-09-28 ENCOUNTER — Ambulatory Visit

## 2023-09-29 LAB — COMPREHENSIVE METABOLIC PANEL WITH GFR
Calcium: 9.2 (ref 8.7–10.7)
eGFR: 89

## 2023-09-29 LAB — BASIC METABOLIC PANEL WITH GFR
BUN: 9 (ref 4–21)
CO2: 34 — AB (ref 13–22)
Chloride: 103 (ref 99–108)
Creatinine: 0.5 (ref 0.5–1.1)
Glucose: 86
Potassium: 4.5 meq/L (ref 3.5–5.1)
Sodium: 141 (ref 137–147)

## 2023-10-05 ENCOUNTER — Ambulatory Visit: Attending: Internal Medicine | Admitting: Occupational Therapy

## 2023-10-05 ENCOUNTER — Ambulatory Visit

## 2023-10-05 DIAGNOSIS — R2689 Other abnormalities of gait and mobility: Secondary | ICD-10-CM

## 2023-10-05 DIAGNOSIS — R278 Other lack of coordination: Secondary | ICD-10-CM | POA: Diagnosis present

## 2023-10-05 DIAGNOSIS — M6281 Muscle weakness (generalized): Secondary | ICD-10-CM | POA: Diagnosis present

## 2023-10-05 NOTE — Therapy (Addendum)
 Occupational Therapy Treatment Note  Patient Name: Sherry [name] MRN: [id number] DOB:1934/02/12, 89 y.o., female Today's Date: 11/29/2022  PCP:  [facility] PROVIDER: Abdul Fine, MD  END OF SESSION:   OT End of Session - 09/21/23 1658     Visit Number 136    Number of Visits 192    Date for OT Re-Evaluation 10/05/23    Authorization Time Period    OT Start Time 1100    OT Stop Time 1145    OT Time Calculation (min) 45 min    Equipment Utilized During Treatment powered wheelchair    Activity Tolerance Patient tolerated treatment well    Behavior During Therapy WFL for tasks assessed/performed                       Past Medical History:  Diagnosis Date   Acute blood loss anemia    Arthritis    Cancer (HCC)    skin   Central cord syndrome at C6 level of cervical spinal cord (HCC) 11/29/2017   Hypertension    Protein-calorie malnutrition, severe (HCC) 01/24/2018   S/P insertion of IVC (inferior vena caval) filter 01/24/2018   Tetraparesis (HCC)    Past Surgical History:  Procedure Laterality Date   ANTERIOR CERVICAL DECOMP/DISCECTOMY FUSION N/A 11/29/2017   Procedure: Cervical five-six, six-seven Anterior Cervical Decompression Fusion;  Surgeon: Cheryle Debby DELENA, MD;  Location: [facility] OR;  Service: Neurosurgery;  Laterality: N/A;  Cervical five-six, six-seven Anterior Cervical Decompression Fusion   CATARACT EXTRACTION     FEMUR IM NAIL Left 08/16/2021   Procedure: INTRAMEDULLARY (IM) NAIL FEMORAL;  Surgeon: Cleotilde Barrio, MD;  Location: [facility] ORS;  Service: Orthopedics;  Laterality: Left;   IR IVC FILTER PLMT / S&I /IMG GUID/MOD SED  12/08/2017   Patient Active Problem List   Diagnosis Date Noted   Age-related osteoporosis without current pathological fracture 07/27/2022   Mild recurrent major depression (HCC) 07/27/2022   Hypertensive heart disease with other congestive heart failure (HCC) 02/14/2022   Chronic indwelling Foley catheter 02/14/2022    Closed hip fracture (HCC) 08/15/2021   DVT (deep venous thrombosis) (HCC) 08/15/2021   Quadriplegia (HCC) 08/15/2021   Fall 08/15/2021   Constipation due to slow transit 08/31/2018   Trauma 06/05/2018   Neuropathic pain 06/05/2018   Neurogenic bowel 06/05/2018   Vaginal yeast infection 01/30/2018   Healthcare-associated pneumonia 01/25/2018   Chronic allergic rhinitis 01/24/2018   Depression with anxiety 01/24/2018   UTI due to Klebsiella species 01/24/2018   Protein-calorie malnutrition, severe (HCC) 01/24/2018   S/P insertion of IVC (inferior vena caval) filter 01/24/2018   Tetraparesis (HCC) 01/20/2018   Neurogenic bladder 01/20/2018   Reactive depression    Benign essential HTN    Acute postoperative anemia due to expected blood loss    Central cord syndrome at C6 level of cervical spinal cord (HCC) 11/29/2017   Allergy to alpha-gal 11/25/2016   SCC (squamous cell carcinoma) 04/08/2014   ONSET DATE: 11/29/2017  REFERRING DIAG: Central Cord Syndrome at C6 of the Cervical Spinal Cord, Fall with Bilateral Closed Hip Fractures, with ORIF repair with Intramedullary Nailing of the Left Hip Fracture.   THERAPY DIAG:  Muscle weakness (generalized)  Other lack of coordination  Rationale for Evaluation and Treatment: Rehabilitation  SUBJECTIVE:  SUBJECTIVE STATEMENT:  Pt. Reports that she has an appointment for an Echocardiogram this afternoon.  Pt accompanied by: self, Personal Care aide  PERTINENT HISTORY: Central Cord Syndrome at C6 of the Cervical  Spinal Cord, Fall with Bilateral Closed Hip Fractures, with ORIF repair with Intramedullary Nailing of the Left Hip Fracture.   PRECAUTIONS: None  WEIGHT BEARING RESTRICTIONS: No  PAIN:  Are you having pain? No pain   LIVING ENVIRONMENT: Lives with: [facility] Stairs: No Has following equipment at home: Wheelchair (power)  PLOF: Independent   PATIENT GOALS:  See below for established  goals  OBJECTIVE:  Note: Objective measures were completed at Evaluation unless otherwise noted.  HAND DOMINANCE: Right  ADLs: Caregiver assist with ADLs, and [facility]  MOBILITY STATUS: Uses a power w/c  ACTIVITY TOLERANCE: Activity tolerance: Fair  FUNCTIONAL OUTCOME MEASURES:  Measurements:   12/09/2021:   Shoulder flexion: R: 120(136), L: 138(150) Shoulder abduction: R: 108(120), L: 110(120) Elbow: R: 0-148, L: 0-146 Wrist flexion: R: 55(62), L: 78 Writ extension: R: 34(55), L: 3(4) RD: R: 12(20), L: 16(26) UD: R: 8(24), L: 6(18) Thumb radial abduction: R: 11(20), L: 8(20) Digit flexion to the [facility]: R:  2nd: 5cm(3.5cm), 3rd: 7.5 cm(5cm), 4th: 7cm(3cm), 5th: 6cm(5.5cm) L: 2nd: 8cm(8cm), 3rd: 8cm(8cm), 4th: 7.5cm(7cm), 5th: 7cm(7cm)   02/03/2022:   Shoulder flexion: R: 122(138), L: 148(154) Shoulder abduction: R: 109(125), L: 125(125 Elbow: R: 0-148, L: 0-146 Wrist flexion: R: 60(68), L: 79 Writ extension: R: 40(55), L: 3(10) RD: R: 14(24), L: 20(28) UD: R: 8(24), L: 6(18) Thumb radial abduction: R: 20(25), L: 8(20) Digit flexion to the [facility]: R:  2nd: 4.5cm(3cm), 3rd: 6.5cm(4.5cm), 4th: 6.5 cm(3cm), 5th: 4.5cm(5cm) L: 2nd: 8cm(8cm), 3rd: 8cm(7cm), 4th: 7.5cm(7cm), 5th: 6.5cm(6.5cm)   04/07/2022:   Shoulder flexion: R: 125(150), L: 160(160) Shoulder abduction: R: 109(125), L: 125(125 Elbow: R: 0-148, L: 0-146 Wrist flexion: R: 60(68), L: 79 Writ extension: R: 40(55), L: 5(18) RD: R: 14(24), L: 20(28) UD: R: 18(24), L: 8(18) Thumb radial abduction: R: 20(25), L: 8(20) Digit flexion to the [facility]: R:  2nd: 4.5 cm(4 cm), 3rd: 6.5cm(3cm), 4th: 7 cm (5cm), 5th: 5cm(5cm) L: 2nd: 8cm(8cm), 3rd: 8cm(7cm), 4th: 7.5cm(7cm), 5th: 7cm(7cm)   07/05/2022:   Shoulder flexion: R: 125(154), L: 160(160) Shoulder abduction: R: 95(130), L: 143(143) Elbow: R: 0-148, L: 0-146 Wrist flexion: R: 60(68), L: 79 Writ extension: R: 40(60), L: 0(12) RD: R: 10(24), L: 12(20) UD:  R: 16(24), L: 8(16) Thumb radial abduction: R: 20(25), L: 8(20) Digit flexion to the [facility]: R:  2nd: 4 cm(4cm), 3rd: 6.5cm( 3.5cm), 4th: 7 cm (5cm), 5th: 6cm(5cm) L: 2nd: 8cm(8cm), 3rd: 8cm(7cm), 4th: 8cm(7cm), 5th: 7cm(7cm)   09/13/2022:   Shoulder flexion: R: 125(154), L: 160(160) Shoulder abduction: R: 100(130), L: 143(143) Elbow: R: 0-150, L: 0-150 Wrist flexion: R: 55(68), L: 79 Writ extension: R: 45(60), L: 0(12) RD: R: 10(24), L: 12(20) UD: R: 16(24), L: 8(16) Thumb radial abduction: R: 20(25), L: 8(20) Digit flexion to the [name]: R:  2nd: 4 cm(4cm), 3rd: 6cm( 5cm), 4th: 6 cm (4cm), 5th: 5cm(5cm) L: 2nd: 7cm(cm), 3rd: 7cm(6cm), 4th: 7cm(6cm), 5th: 6cm(6cm)   10/27/2022   Shoulder flexion: R: 128(154), L: 160(160) Shoulder abduction: R: 105(130), L: 143(143) Elbow: R: 0-150, L: 0-150 Wrist flexion: R: 55(68), L: 79 Writ extension: R: 46(64), L: 0(20) RD: R: 10(20), L: 18(24) UD: R: 18(28), L: 8(20) Thumb radial abduction: R: 30(42), L:20(24) Digit flexion to the [facility]: R:  2nd: 4 cm(3cm), 3rd: 6cm( 6cm), 4th: 6 cm (5cm), 5th: 5cm(5cm) L: 2nd: 8cm(8cm), 3rd: 8cm(6cm), 4th: 8cm(7cm), 5th: 6cm(6cm)  01/19/2023  Shoulder flexion: R: 131(154), L: 160(160) Shoulder abduction: R: 110(130), L: 143(143)  Elbow: R: 0-150, L: 0-150 Wrist flexion: R: 55(68), L: 79 Writ extension: R: 46(64), L: 0(20) RD: R: 10(20), L: 18(24) UD: R: 18(28), L: 14(20) Thumb radial abduction: R: 38(48), L:24(26) Thumb IP flexion: R: 35(50) L: 20(45) Digit flexion to the [facility]: R:  2nd: 3.5 cm(3cm), 3rd: 6cm( 6cm), 4th: 6 cm (5cm), 5th: 5cm(4.5cm) L: 2nd: 8cm(7.5cm), 3rd: 8cm(6cm), 4th: 7.5cm(6cm), 5th: 6cm(4.5cm)  04/04/2023  Shoulder flexion: R: 134(154), L: 160(160) Shoulder abduction: R: 114(130), L: 143(143) Elbow: R: WNL, L: WNL Wrist flexion: R: 55(68), L: 80 Writ extension: R: 46(64), L: -10(10) RD: R: 14(24), L: 20(24) UD: R: 20(30), L: 14(20) Thumb radial abduction: R: 38(48),  L:28(30) Thumb IP flexion: R: 40(50) L: 25(45) Digit flexion to the [facility]: R:  2nd: 3.5 cm(3cm), 3rd: 6cm( 5cm), 4th: 6 cm (4cm), 5th: 5cm(5cm) L: 2nd: 8cm(7.5cm), 3rd: 8cm(7cm), 4th: 7.5cm(7cm), 5th: 7cm(6cm)  05/25/2023  Shoulder flexion: R: 134(154), L: 160(160) Shoulder abduction: R: 117(134), L: 143(143) Elbow: R: WNL, L: WNL Wrist flexion: R: 55(68), L: 80 Wrist extension: R: 46(64), L: -8(14) RD: R: 14(24), L: 20(24) UD: R: 20(32), L: 14(22) Thumb radial abduction: R: 38(50), L:28(30) Thumb IP flexion: R: 40(50) L: 30(45) Digit flexion to the [facility]: R:  2nd: 3.5 cm(3cm), 3rd: 6cm( 5cm), 4th: 7 cm (4cm), 5th: 5cm(4cm) L: 2nd: 8cm(7.5cm), 3rd: 8cm(7cm), 4th: 7.5cm(7cm), 5th: 7cm(6cm)  07/13/23  Shoulder flexion: R: 134(154), L: 160(160) Shoulder abduction: R: 122(134), L: 143(143) Elbow: R: WNL, L: WNL Wrist flexion: R: 55(68), L: 80 Wrist extension: R: 50(64), L: -4(18) RD: R: 18(24), L: 24(24) UD: R: 20(32), L: 14(22) Thumb radial abduction: R: 38(50), L:3230) Thumb IP flexion: R: 42(50) L: 30(45) Digit flexion to the [facility]: R:  2nd: 3.5 cm(3cm), 3rd: 6cm( 5cm), 4th: 7 cm (4cm), 5th: 5cm(4cm) L: 2nd: 8cm(7.5cm), 3rd: 8cm(7cm), 4th: 7.5cm(7cm), 5th: 7cm(6cm)   COORDINATION:   01/19/2023:  9 hole peg test:  Right: 2 pegs placed in 1 min. & 5 sec.  Pt. was able to remove 9 pegs in 17 sec.  Left: 2 pegs placed in 1 min. & 10 sec. Pt. was able to remove 9 pegs in 20 sec.  04/04/23:  Pt. was able to consistently grasp the pegs however each one slipped out of her fingers when attempting to place them into the Pegboard.  05/25/2023:   Right: 9 pegs placed and removed in 2 min. & 41 sec. Left: Unable to grasp the pegs for a horizontal position.  07/13/23    Right: 9 pegs placed and removed in 2 min. & 11 sec.   SENSATION: Intact  EDEMA: N/A   COGNITION: Overall cognitive status: WNL   VISION: Subjective report: Wears glasses, No changes in vision.  PERCEPTION:  Intact  TODAY'S TREATMENT:                                                                                                                              DATE:  10/05/23       There. Ex.:  -Pt. tolerated PROM bilateral wrist extension with increased focus on the left wrist extension. PROM for bilateral digit MP, PIP, and DIP flexion, and extension, thumb abduction was performed per Pt. request. -Performed BUE strengthening using a 2# dowel ex. 2/2 to weakness. Bilateral shoulder flexion, chest press, circular patterns, and elbow flexion/extension for 1 set  10 reps each.    PATIENT EDUCATION: Education details: Bilateral hand  ROM, condition management Person educated: Patient Education method: Explanation, Demonstration, and Verbal cues Education comprehension: verbalized understanding and returned demonstration  HOME EXERCISE PROGRAM: Continue to assess HEP needs, and provide as needed.   GOALS: Goals reviewed with patient? Yes  LONG TERM GOALS: Target date: 10/05/2023  3.  Patient will independently use a reacher to pick up items from various surfaces.  Baseline: 07/13/2023: Pt. Is starting to use a reacher to retrieve items with an adaptation/modification to the trigger.05/25/2023:  Pt. is now able to initiate grasping a reacher to open and close the reacher with the right hand, however requires the grasp handle to be repositioned  within the hand. Pt. requires work on using/moving the reacher with the right hand through various planes. 04/04/2023: Pt. continues to work on improving Digit flexion to the [facility] to be able to efficiently handle Adaptive equipment. 01/19/2023: Pt. Is improving with Bilateral digit flexion to the [facility]. Pt. has difficulty firmly holding adaptive devices. 12/06/2022: Pt. continues to present with increased MP, PIP, and DIP digit tightness/stiffness limiting the formulation of bilateral composites fists. 10/27/2022: Pt. presents with increased MP, PIP, and DIP digit  tightness/stiffness limiting the formulation of bilateral composites fists during this progress reporting period limiting. Digit flexion to the [facility]: R:  2nd: 4 cm(3cm), 3rd: 6cm( 6cm), 4th: 6 cm (5cm), 5th: 5cm(5cm), L: 2nd: 8cm(8cm), 3rd: 8cm(6cm), 4th: 8cm(7cm), 5th: 6cm(6cm) 09/20/2022: Improving digit flexion to the [facility]. 09/13/22: improved digit flexion to the [facility]. 07/05/2022: Pt. presents with digit MP, PIP, and DIP extensor tightness limiting her ability to securely grip objects in her bilateral hands.  04/07/2022: Pt. Has improved with right 2nd, and 3rd digit flexion towards the [facility]. Pt. Continues to have difficulty securely holding and applying deodorant.02/03/2022: Pt. Has improved with digit flexion, however, continues to have difficulty securely holding and using deodorant. 12/09/2021: Bilateral hand/digit MP, PIP, and DIP extension tightness limits her ability achieve digit flexion to hold, and apply deodorant. 10/28/2021:  Pt. continues to have difficulty holding the deodorant. 01/12/2021: Pt. Presents with limited digit extension. Pt. Is able to initiate holding deodorant, however is unable to hold it while using it  Goal status: Ongoing   4.  Pt. Will improve bilateral wrist extension in preparation for anticipating, and initiating reaching for objects at the table.  Baseline: 07/13/23:  Wrist extension: R: 50(64), L: -4(18) 05/25/23: Wrist extension: R: 46(64), L: -8(14) 04/04/23: R: 46(64) L: -10(10)01/19/2023:  R: 46(64) L: 0(20) 12/06/2022: Pt continues to to progress with bilateral wrist extension in preparation fro functional reaching. 10/27/2022: Pt. Is improving with bilateral wrist extension. R: 46(64) L: 0(20) 09/20/2022: limited PROM in the Left wrist extension 2/2 tightness/stiffness. 09/13/22: R: 55(68), L: 0(12) 07/19/2022: Bilateral wrist extension in limited. 07/05/2022: Right: 40(60) Left: 0(12) 04/07/2022: Right: 40(55) Left: 5(18) 02/03/2022: Right: 34(55) Left: 3(4) 12/09/2021: Right  34(55)   Left  3(4) 10/28/2021:  Right 22(38), Left 0(15) 09/14/2021:  Right: 17(35), left 2(15)  Goal status: Progressing/Ongoing  7.  Pt. Will increase bilateral lateral pinch  strength by 2 lbs to be able to securely grasp items during ADLs, and IADL tasks.  Baseline: 01/19/2023: Deferred as the Pinch meter is out for calibration.12/06/2022: Pt. Is able to more securely hold objects during ADLs/IADLs 10/27/2022: Pt. Is improving with holding items with her bilateral thumbs. (Pinch meter out for calibration) 09/20/2022: lateral pinch continues to be limited 09/13/22: R 6.5# L 4# 07/19/2022: TBD 07/05/2022: TBD 07/2022: NT-Pinch meter out for calibration. 02/03/2022: Right: 6#, Left: 4# 12/09/2021: Right: 6#, Left: 4#  Goal status: Deferred   9.  Pt. will complete plant care with modified independence.  Baseline:12/06/2022: Pt. Is independent with plant care in her husband's room, however is not able to access her plants due to furniture blocking access to them. 10/27/2022: Pt. Is able to water plants that are closer, and within reach. Pt. continues to have difficulty reaching for thorough plant care. 09/20/2022: Pt. Continues to have difficulty reaching for thorough plant care. 09/13/22: continues to report intermittent difficulty 07/19/2022: Pt. Continues to be able to water, and care for some of her plants. Pt. Has more difficulty with plants that are harder to reach. 07/05/2022: pt. Is able to water, and care for some of her plants. Pt. Has more difficulty with plants that are harder to reach. 04/07/2022: Pt. is now able to hold a cup and water her plants.02/03/2022: Pt. has difficulty caring for her plants.  Goal status:Achieved   10.  Pt. will demonstrate adaptive techniques to assist with the efficiency of self-dressing, or morning care tasks.  Baseline: 07/13/23:Pt. continues to work on improving UE functioning to be able to efficiently use Adaptive equipment 05/25/2023: Pt. continues to work on improving UE functioning  to be able to efficiently use Adaptive equipment.  04/04/2023:  Pt. continues to work on improving UE functioning to be able to efficiently use Adaptive equipment. 01/19/2203: Pt. Is able to donn her jacket in reverse independently. Pt. requires assist from staff, as Pt. Reports staff have decreased time. 12/06/2022: Pt. requires assist from staff, as Pt. Reports staff have decreased time. 10/27/2022: Pt. is able to assist with initiating UE dressing. Pt. requires assist from personal, and staff care aides. 09/20/2022: Pt. continues to require assist form personal, and staff care aides. 09/13/22: MODA dressing, reports she is often rushed 07/19/2022: Pt. continues to require assist with self-dressing/morning care tasks. 07/05/2022: Continue with goal. 04/07/2022: Pt. Continues to require assist with the efficiency of self-dressing, and morning care tasks.02/03/2022: Pt. requires assist from caregivers 2/2 time limitations during morning care.  Goal status: Ongoing  11.  Pt. will improve BUE strength to improve ADL, and IADL functioning.  Baseline: 07/13/23: shoulder flexion L 4/5, R 4-/5; shoulder abduction L 4+/5, R 4-/5. elbow flexion B 5/5, elbow extension 4+/5 01/19/2023: L 4/5, R 3+/5  05/25/2023: shoulder flexion L 4/5, R 4-/5; shoulder abduction L 4+/5, R 4-/5. elbow flexion B 5/5, elbow extension 4+/5 01/19/2023: L 4/5, R 3+/5  04/04/2023: shoulder flexion L 4/5, R 3+/5; shoulder abduction L 4+/5, R 3+/5. elbow flexion B 5/5, elbow extension 4/5 01/19/2023: L 4/5, R 3+/5 12/06/2022: Continue 10/27/2022: shoulder flexion L 4/5, R 3+/5; shoulder abduction L 4+/5, R 3+/5. elbow flexion B 5/5, elbow extension 4/5 09/20/2022: Pt. Presents with limited BUE strength 09/13/22: shoulder flexion L 4/5, R 3/5; elbow flexion B 5/5, elbow extension 4-/5; shoulder abduction L 4+/5, R 3+/5. 07/19/2022: BUE strength continues to be limited. 07/05/2022: shoulder flexion: right 4-/5, abduction: 3+/5, elbow flexion: right: 5/5, left 5/5,  extension:  right: 4-/5, left 4-/5, wrist extension: right: 3-/5, left: 2-/5  Goal status: Ongoing  12.  Pt. will improve bilateral thumb radial abduction in order to be able to hold the grab bars while standing with PT  Baseline: 07/13/23: Pt. Is improving with holding the grab bars with the right hand .05/25/23: Thumb radial abduction: R: 38(50), L:28(30) 04/04/23: R: 38(48), L:28(30) 01/19/2023: R: 38(48) L: 24(26)12/06/2022: Pt. Continues to present  with limited thumb abduction, however is improving holding onto the parallel bars.10/27/2022: thumb radial abduction: R: 30(42), L: 20(24  Goal status: Ongoing  13.  Pt. Will be able to securely hold, and stabilize medication bottles at a tabletop surface when opening, and closing them.  Baseline: 01/19/2023: Pt. Is now able to securely hold medication bottles while opening them, and stabilizes them on surfaces while opening them. 12/06/2022: Pt is improving with securely stabilizing medication bottles. 10/27/2022: Pt. Stabilizes bottles against her torso when attempting to open, and close them   Goal status: Achieved  14: Pt. Will improve bilateral thumb IP flexion to improve active grasping patterns.    Baseline: 07/13/23: Thumb IP flexion: R: 42(50) L: 30(45) 05/25/2023: Thumb IP flexion: R: 40(50) L: 30(45) 04/04/2023:  R: 40(50) L: 25(45) 01/19/2023: Right 35(50), Left: 20/45)    Goal status: Ongoing    15. Pt. Will improve bilateral FMC/speed, and dexterity. As evidence by improved scores on the 9 hole peg test.  Baseline: 07/13/2023: Right: 9 pegs placed and removed in 2 min. & 11 sec. 05/25/2023: 9 Hole pegs test speed/dexterity: Right: 9 pegs placed and removed in 2 min. & 41 sec. Left: Unable to grasp the pegs for a horizontal position. 04/04/23: Pt. was able to consistently grasp the pegs however each one slipped out of her fingers when attempting to place them into the Pegboard. 01/19/2023: 2 pegs placed in 1 min. & 5 sec.  Pt. was able to remove 9  pegs in 17 sec.; Left: 2 pegs placed in 1 min. & 10 sec. Pt. was able to remove 9 pegs in 20 sec.   Goal status: Ongoing  ASSESSMENT:  CLINICAL IMPRESSION:  Pt. BP 115/75 HR 79 bpms. Pt. reports that her BP was elevated this morning. Pt. tolerated UE ROM, and strengthening well today. Pt. continues to present with limited left wrist extension, as well as limited digit flexion 2/2 tight extensors. Plan to recert next visit. Pt. continues to benefit from OT services to work on impoving ROM, and UE strength in order to work towards increasing bilateral hand grasp on objects, and increasing engagement of bilateral hands during ADLs, and IADL tasks.      PERFORMANCE DE PERFORMANCE DEFICITS: in functional skills including ADLs, IADLs, coordination, dexterity, ROM, strength, and UE functional use, cognitive skills including , and psychosocial skills including coping strategies and environmental adaptation.   IMPAIRMENTS: are limiting patient from ADLs, IADLs, and leisure.   CO-MORBIDITIES: may have co-morbidities  that affects occupational performance. Patient will benefit from skilled OT to address above impairments and improve overall function.  MODIFICATION OR ASSISTANCE TO COMPLETE EVALUATION: Min-Moderate modification of tasks or assist with assess necessary to complete an evaluation.  OT OCCUPATIONAL PROFILE AND HISTORY: Detailed assessment: Review of records and additional review of physical, cognitive, psychosocial history related to current functional performance.  CLINICAL DECISION MAKING: Moderate - several treatment options, min-mod task modification necessary  REHAB POTENTIAL: Good for stated goals  PLAN:  OT FREQUENCY 2x's a week  OT DURATION: 12 weeks  PLANNED INTERVENTIONS  ADL training, A/E training, UE ther. Ex, Manual therapy, neuromuscular re-education, moist heat modality, Paraffin Bath, Splinting, and  Pt./caregiver education   RECOMMENDED OTHER SERVICES: PT  CONSULTED  AND AGREED WITH PLAN OF CARE: Patient  PLAN FOR NEXT SESSION: Continue Treatment as per established POC  Richardson Otter, MS, OTR/L  10/05/23, 3:41 PM

## 2023-10-05 NOTE — Therapy (Signed)
 OUTPATIENT PHYSICAL THERAPY NEURO TREATMENT/RECERT  Patient Name: [name] MRN: [id number] DOB:Jun 16, 1934, 89 y.o., female Today's Date: 10/05/2023   PCP: Sherry Brigham, MD REFERRING PROVIDER: Cleotilde [name]   PT End of Session - 10/05/23 1152     Visit Number 126    Number of Visits 150    Date for PT Re-Evaluation 12/28/23    Progress Note Due on Visit 130    PT Start Time 1146    PT Stop Time 1221    PT Time Calculation (min) 35 min    Equipment Utilized During Treatment Gait belt    Activity Tolerance Other (comment)    Behavior During Therapy WFL for tasks assessed/performed                           Past Medical History:  Diagnosis Date   Acute blood loss anemia    Arthritis    Cancer (HCC)    skin   Central cord syndrome at C6 level of cervical spinal cord (HCC) 11/29/2017   Hypertension    Protein-calorie malnutrition, severe (HCC) 01/24/2018   S/P insertion of IVC (inferior vena caval) filter 01/24/2018   Tetraparesis (HCC)    Past Surgical History:  Procedure Laterality Date   ANTERIOR CERVICAL DECOMP/DISCECTOMY FUSION N/A 11/29/2017   Procedure: Cervical five-six, six-seven Anterior Cervical Decompression Fusion;  Surgeon: Sherry Debby DELENA, MD;  Location: [name];  Service: Neurosurgery;  Laterality: N/A;  Cervical five-six, six-seven Anterior Cervical Decompression Fusion   CATARACT EXTRACTION     FEMUR IM NAIL Left 08/16/2021   Procedure: INTRAMEDULLARY (IM) NAIL FEMORAL;  Surgeon: Sherry Barrio, MD;  Location: [name];  Service: Orthopedics;  Laterality: Left;   IR IVC FILTER PLMT / S&I /IMG GUID/MOD SED  12/08/2017   Patient Active Problem List   Diagnosis Date Noted   Chronic pain syndrome 08/25/2023   Orthostatic hypotension 08/25/2023   Age-related osteoporosis without current pathological fracture 07/27/2022   Mild recurrent major depression (HCC) 07/27/2022   Hypertensive heart disease with other congestive heart  failure (HCC) 02/14/2022   Chronic indwelling Foley catheter 02/14/2022   Closed hip fracture (HCC) 08/15/2021   DVT (deep venous thrombosis) (HCC) 08/15/2021   Quadriplegia (HCC) 08/15/2021   Fall 08/15/2021   Constipation due to slow transit 08/31/2018   Neuropathic pain 06/05/2018   Neurogenic bowel 06/05/2018   Healthcare-associated pneumonia 01/25/2018   Chronic allergic rhinitis 01/24/2018   Depression with anxiety 01/24/2018   Protein-calorie malnutrition, severe (HCC) 01/24/2018   S/P insertion of IVC (inferior vena caval) filter 01/24/2018   Tetraparesis (HCC) 01/20/2018   Neurogenic bladder 01/20/2018   Reactive depression    Benign essential HTN    Acute postoperative anemia due to expected blood loss    Central cord syndrome at C6 level of cervical spinal cord (HCC) 11/29/2017   Allergy to alpha-gal 11/25/2016   SCC (squamous cell carcinoma) 04/08/2014    ONSET DATE: 08/15/2021 (fall with B hip fx); Initial injury was in 2019- quadraplegia due to central cord syndrome of C6 secondary to neck fracture.   REFERRING DIAG: Bilateral Hip fx; Left Open- s/p Left IM nail femoral on 08/16/2021; Right closed  THERAPY DIAG:  Muscle weakness (generalized) - Plan: PT plan of care cert/re-cert  Other lack of coordination - Plan: PT plan of care cert/re-cert  Other abnormalities of gait and mobility - Plan: PT plan of care cert/re-cert  Rationale for Evaluation and Treatment Rehabilitation  SUBJECTIVE:  SUBJECTIVE STATEMENT:   Patient reports she has been feeling rough - reports was in ED last week with nausea and Vomiting. States she does not return to Cardiology until next week but doesn't think she will be able to participate well and decided to hold visits while talking to PT today until she is seen by Cardiology on 09/30/2023.        Pt accompanied by: Paid caregiver and dtr-   PERTINENT HISTORY: [facility]ry [name] is an 87yoF who experienced a fall at home on 08/15/2021  with hip fracture, s/p Left hip ORIF. PMH: quadriplegia due to central cord syndrome of C6 secondary to neck fracture, HTN, depression with anxiety, neurogenic bladder, bilateral DVT on Xarelto  (s/p of IVC filter placement). Prior history of significant PT/OT with improvement of function. Pt has full use of shoulder/elbows, limited use of hands, adaptive self feeding sucessful.     PAIN:  None current but does endorse some left thigh pain intermittent  OBJECTIVE:   TODAY'S TREATMENT:   10/05/23    Physical therapy treatment session today consisted of completing assessment of goals and administration of testing as demonstrated and documented in flow sheet, treatment, and goals section of this note. Addition treatments may be found below.   R knee ext (AROM- 2 x 10 reps) and Left knee ext ( 2 x 10 reps)    PATIENT EDUCATION: Education details: Exercise technique Patient verbalized understanding.   HOME EXERCISE PROGRAM: No changes at this time  Access Code: [name] URL: [name] Date: 11/23/2021 Prepared by: Sherry  [name] Exercises - Seated Gluteal Sets - 2 x daily - 7 x weekly - 2 sets - 10 reps - 5 hold - Seated Quad Set - 2 x daily - 7 x weekly - 2 sets - 10 reps - 5 hold - Seated Long Arc Quad - 2 x daily - 7 x weekly - 2 sets - 10 reps - 5 hold - Seated March - 2 x daily - 7 x weekly - 2 sets - 10 reps - 5 hold - Seated Hip Abduction - 2 x daily - 7 x weekly - 2 sets - 10 reps - 5 hold - Seated Shoulder Shrugs - 2 x daily - 7 x weekly - 2 sets - 10 reps - 5 hold - Wheelchair Pressure Relief - 2 x daily - 7 x weekly - 2 sets - 10 reps - 5 hold     GOALS: Goals reviewed with patient? Yes  SHORT TERM GOALS: Target date: 02/22/2022  Pt will be independent with initial UE strengthening HEP in order to improve strength and balance in order to decrease fall risk and improve function at home and work. Baseline: 10/19/2021- patient with no formal UE HEP; 12/02/2021=  Patient verbalized knowledge of HEP including use of theraband for UE strengthening.  Goal status: GOAL MET  LONG TERM GOALS: Target date: 12/28/2023   Pt will be independent with final for UE/LE HEP in order to improve strength and balance in order to decrease fall risk and improve function at home and work. Baseline: Patient is currently BLE NWB and unable to participate in HEP. Has order for UE strengthening. 01/11/2022- Patient now able to participate in LE strengthening although NWB still- good understanding for some basic exercises. Will keep goal active to incorporate progressive LE strengthening exercises. 04/07/2022= Patient still participating in progressive LE seated HEP and has no questions at this time.  Goal status: MET  2.  Pt will improve FOTO to target  score of 35  to display perceived improvements in ability to complete ADL's.  Baseline: 10/19/2021= 12; 12/02/2021= 17; 01/11/2022=17; 04/07/2022= 17; 07/05/2022=17; 10/06/2022- outcome measure not appropriate as patient in non-ambulatory and not consistently standing. Goal status: Goal not appropriate  3.  Pt will increase strength of B UE  by at least 1/2 MMT grade in order to demonstrate improvement in strength and function  Baseline: patient has range of 2-/5 to 4/5 BUE Strength; 12/02/2021= 4/5 except with wrist ext. 01/11/2022= 4/5 B UE strength expept for wrist Goal status: Goal revised-no longer appropriate- working with OT on all UE strengthening.   4. Pt. Will increase strength of RLE by at least 1/2 MMT grade in order to demonstrate improvement in Standing/transfers.  Baseline: 2-/5 Right hip flex/knee ext/flex; 04/07/2022= 2- with right hip flex/knee ext (lacking 28 deg from zero) 4/8= Patient able to ext right knee lacking 18 deg from zero. 07/05/2022=Left knee approx 8 deg from zero and right knee = 23 deg from zero; 12/29/2022 - Left LE = full ROM 4-/5 and right knee ext= 23 deg from zero= 2+/5 (gravity minimized); 03/28/2023= Left  knee ext=  4/5 and right knee ext= 3-/5 (able to move lower leg through incomplete ROM -lacking 20 deg from full ROM against gravity); 05/23/2023= right knee ext= 3-/5 (able to move lower leg through incomplete ROM -lacking 17 deg from full ROM against gravity); 06/20/2023= Right knee ext= lacking 20 deg from zero; 10/05/2023- lacking 24 deg from zero RLE Knee ext  Goal status: Progressing  5. Pt. Will demo ability to stand pivot transfer with max assist for improved functional mobility and less dependent need on mechanical device.   Baseline: Dependent on hoyer lift for all transfers; 04/07/2022- Unable to test secondary to patient with recent UTI and right hand procedure - will attempt to reassess next visit. ; 04/12/22: Unable to successfully stand due to feet plates on loaned power w/c; 05/10/2022- Patient still having to use rental chair and has not received her original power w/c back to practice SPT. 07/04/2021= Patient just received new power w/c and attempted standing from 1st time today- Patient able to stand with max assist today from w/c and did not attempt to perform pivot transfer- difficulty with static standing and placing weight on right LE. 10/06/2022- Not assessed today- patient needs new AFO prior to attempting transfers; 12/29/2022= Patient has not attempted SPT yet - still trying to improve LE strength enough to stand well without knee buckling with weight shifting. 03/28/2023- not attempted today due to ill fitting brace but patient last performed SPT on 01/10/23 and was able to perform with Max A with feet pre-positioned to angle toward direction of transfer; 05/23/2023- Not tested today but on last visit- patient able to perform SPT with max assist yet concern for twisting knee as she was unable to turn/pivot foot .  06/20/2023- deferred to next session due to patient fatigued with static standing today. 10/05/2023-Patient able to SPT with Max assist yet still unable to move LE to pivot so transfers are  performed very slowly.   Goal status: MET  6. Pt. Will demonstrate improved functional LE strength as seen by ability to stand > 2 min for improved transfer ability and pregait abilities.   Baseline: not assessed today due to recent dx: UTI; 04/07/2022- Unable to test secondary to patient with recent UTI and right hand procedure - will attempt to reassess next visit; 04/12/22: Unable to successfully stand due to feet plates on  loaned power w/c 05/10/2022- Patient still having to use rental chair and has not received her original power w/c back to practice Standing. 07/05/2022- Patient able to stand with max assist at trunk today for 2 min 3 sec. Will keep goal active to ensure consistency. 10/06/2022= Unable to assess today- patient with some recent blood pressure issues and needs new AFO. 11/24/2022= Patient demonstrated multiple rounds of standing in // bars- holding up to 1:45 min today but has been able to exhibit 2 min in recent past- working on consistency. 12/29/2022= 2 min 40 sec with patient demonstrating improved overall posture and ability to contract glutes and quads with just min assist at trunk today.   Goal status: MET  7. Pt. Will demonstrate improved functional LE strength as seen by ability to stand > 5 min for improved transfer ability and pregait abilities. Baseline: 12/29/2022= 2 min 40 sec with patient demonstrating improved overall posture and ability to contract glutes and quads with just min assist at trunk today. 03/28/2023= Attempted 2 trials today - at best 2 min 48 sec today but during cert- did stand for 3 min 44 sec. 06/20/2023= Patient able to stand 2x (45 sec and 1 min 12 sec)- max assist yet no slippage of LLE.  07/13/2023= Patient able to stand x 3 trials today- each 1-2 min focusing on posture- patient with improving gluteal activation and less physical assist once in standing position. 10/05/2023= Patient able to stand for 1 min 11 sec today- Left knee buckled.  Goal Status:  Ongoing  8. Patient will perform sit to stand transfer with moderate assist consistently > 75% of time for improved transfer ability and less dependence on caregiver.   Baseline: 12/29/2022- Patient currently max assist level assistance with all sit to stand activities. 03/28/2023= Patient is still majority of max assist to stand yet able to pull up from wheelchair smoother in one motion. 06/20/2023- Max Assist but has only most recently resumed standing since last week so goal still appropriate for new cert; 3/88/7974=dupoo max assist but less overall assist on last trial of standing with improved forward lean and gluteal activation. 10/05/2023= Patient able to transfer with Max A with inability to pivot/move her LE - Increased risk of injury.   Goal status: ONGOING  9. 4. Pt. Will increase strength of RLE as seen by ability to achieve < 15 deg from zero with R knee ext in order to demonstrate improvement in Standing/transfers.  Baseline: 10/05/2023= lacking 24 deg from zero (active R knee ext - measured sitting in power w/c)  Goal status: NEW    ASSESSMENT:  CLINICAL IMPRESSION:   Patient presents with good motivation and reports feeling better overall. She was agreeable to retesting for goal reassessment but presents with increase weakness most likely related to limited PT over past month due to cardiac/BP issues. She is back today and feeling better so all remaining goals still appropriate and will focus on standing and transfers during new cert along with progressive LE strengthening. Pt will continue to benefit from skilled PT services to optimize independence and reduced caregiver support.   OBJECTIVE IMPAIRMENTS Abnormal gait, decreased activity tolerance, decreased balance, decreased coordination, decreased endurance, decreased mobility, difficulty walking, decreased ROM, decreased strength, hypomobility, impaired flexibility, impaired UE functional use, postural dysfunction, and pain.    ACTIVITY LIMITATIONS carrying, lifting, bending, sitting, standing, squatting, sleeping, stairs, transfers, bed mobility, continence, bathing, toileting, dressing, self feeding, reach over head, hygiene/grooming, and caring for others  PARTICIPATION LIMITATIONS: meal  prep, cleaning, laundry, medication management, personal finances, interpersonal relationship, driving, shopping, community activity, and yard work  PERSONAL FACTORS Age, Time since onset of injury/illness/exacerbation, and 1-2 comorbidities: HTN, cervical Sx are also affecting patient's functional outcome.   REHAB POTENTIAL: Good  CLINICAL DECISION MAKING: Evolving/moderate complexity  EVALUATION COMPLEXITY: Moderate  PLAN: PT FREQUENCY: 1-2x/week  PT DURATION: 12 weeks  PLANNED INTERVENTIONS: Therapeutic exercises, Therapeutic activity, Neuromuscular re-education, Balance training, Gait training, Patient/Family education, Self Care, Joint mobilization, DME instructions, Dry Needling, Electrical stimulation, Wheelchair mobility training, Spinal mobilization, Cryotherapy, Moist heat, and Manual therapy  PLAN FOR NEXT SESSION:    Continue with LE and core strength training  Sit to stand and static standing trials to promote improved standing. SPT as able.     1:22 PM, 10/05/23  1:22 PM, 10/05/23 Chyrl London, PT Physical Therapist - [facility]  Outpatient Physical Therapy- [facility] [phone]

## 2023-10-06 LAB — BASIC METABOLIC PANEL WITH GFR
BUN: 10 (ref 4–21)
CO2: 32 — AB (ref 13–22)
Chloride: 101 (ref 99–108)
Creatinine: 0.5 (ref 0.5–1.1)
Glucose: 83
Potassium: 4.2 meq/L (ref 3.5–5.1)
Sodium: 141 (ref 137–147)

## 2023-10-06 LAB — COMPREHENSIVE METABOLIC PANEL WITH GFR
Calcium: 9.2 (ref 8.7–10.7)
eGFR: 90

## 2023-10-06 LAB — CBC AND DIFFERENTIAL
HCT: 41 (ref 36–46)
Hemoglobin: 13 (ref 12.0–16.0)
Neutrophils Absolute: 3427
Platelets: 330 K/uL (ref 150–400)
WBC: 5.5

## 2023-10-06 LAB — CBC: RBC: 4.25 (ref 3.87–5.11)

## 2023-10-10 ENCOUNTER — Ambulatory Visit

## 2023-10-10 ENCOUNTER — Ambulatory Visit: Admitting: Occupational Therapy

## 2023-10-12 ENCOUNTER — Ambulatory Visit: Admitting: Occupational Therapy

## 2023-10-12 ENCOUNTER — Ambulatory Visit

## 2023-10-17 ENCOUNTER — Ambulatory Visit: Admitting: Occupational Therapy

## 2023-10-17 ENCOUNTER — Ambulatory Visit

## 2023-10-19 ENCOUNTER — Ambulatory Visit

## 2023-10-19 ENCOUNTER — Ambulatory Visit: Admitting: Occupational Therapy

## 2023-10-24 ENCOUNTER — Ambulatory Visit

## 2023-10-24 ENCOUNTER — Ambulatory Visit: Admitting: Occupational Therapy

## 2023-10-26 ENCOUNTER — Ambulatory Visit

## 2023-10-26 ENCOUNTER — Ambulatory Visit: Admitting: Occupational Therapy

## 2023-10-31 ENCOUNTER — Ambulatory Visit: Admitting: Occupational Therapy

## 2023-10-31 ENCOUNTER — Ambulatory Visit

## 2023-11-02 ENCOUNTER — Ambulatory Visit: Attending: Internal Medicine | Admitting: Occupational Therapy

## 2023-11-02 ENCOUNTER — Ambulatory Visit

## 2023-11-02 DIAGNOSIS — R262 Difficulty in walking, not elsewhere classified: Secondary | ICD-10-CM | POA: Insufficient documentation

## 2023-11-02 DIAGNOSIS — R2689 Other abnormalities of gait and mobility: Secondary | ICD-10-CM | POA: Insufficient documentation

## 2023-11-02 DIAGNOSIS — S72002A Fracture of unspecified part of neck of left femur, initial encounter for closed fracture: Secondary | ICD-10-CM | POA: Diagnosis present

## 2023-11-02 DIAGNOSIS — M6281 Muscle weakness (generalized): Secondary | ICD-10-CM | POA: Diagnosis present

## 2023-11-02 DIAGNOSIS — R2681 Unsteadiness on feet: Secondary | ICD-10-CM

## 2023-11-02 DIAGNOSIS — S14129S Central cord syndrome at unspecified level of cervical spinal cord, sequela: Secondary | ICD-10-CM | POA: Insufficient documentation

## 2023-11-02 DIAGNOSIS — S72001A Fracture of unspecified part of neck of right femur, initial encounter for closed fracture: Secondary | ICD-10-CM | POA: Diagnosis present

## 2023-11-02 DIAGNOSIS — R278 Other lack of coordination: Secondary | ICD-10-CM

## 2023-11-02 DIAGNOSIS — R296 Repeated falls: Secondary | ICD-10-CM | POA: Insufficient documentation

## 2023-11-02 NOTE — Therapy (Signed)
 Occupational Therapy Treatment/Recertification Note  Patient Name: Sherry [name] MRN: [id number] DOB:1934-03-02, 89 y.o., female Today's Date: 11/29/2022  PCP:  [facility] PROVIDER: Abdul Fine, MD  END OF SESSION:   OT End of Session - 11/02/23 1106     Visit Number [id number]    Number of Visits 192    Date for Recertification  01/25/24    Authorization Time Period Progress report period starting 04/04/23    OT Start Time 1105    OT Stop Time 1145    OT Time Calculation (min) 40 min    Equipment Utilized During Treatment powered wheelchair    Activity Tolerance Patient tolerated treatment well    Behavior During Therapy WFL for tasks assessed/performed            Past Medical History:  Diagnosis Date   Acute blood loss anemia    Arthritis    Cancer (HCC)    skin   Central cord syndrome at C6 level of cervical spinal cord (HCC) 11/29/2017   Hypertension    Protein-calorie malnutrition, severe (HCC) 01/24/2018   S/P insertion of IVC (inferior vena caval) filter 01/24/2018   Tetraparesis (HCC)    Past Surgical History:  Procedure Laterality Date   ANTERIOR CERVICAL DECOMP/DISCECTOMY FUSION N/A 11/29/2017   Procedure: Cervical five-six, six-seven Anterior Cervical Decompression Fusion;  Surgeon: Cheryle Debby DELENA, MD;  Location: [facility] OR;  Service: Neurosurgery;  Laterality: N/A;  Cervical five-six, six-seven Anterior Cervical Decompression Fusion   CATARACT EXTRACTION     FEMUR IM NAIL Left 08/16/2021   Procedure: INTRAMEDULLARY (IM) NAIL FEMORAL;  Surgeon: Cleotilde Barrio, MD;  Location: [facility] ORS;  Service: Orthopedics;  Laterality: Left;   IR IVC FILTER PLMT / S&I /IMG GUID/MOD SED  12/08/2017   Patient Active Problem List   Diagnosis Date Noted   Age-related osteoporosis without current pathological fracture 07/27/2022   Mild recurrent major depression (HCC) 07/27/2022   Hypertensive heart disease with other congestive heart failure (HCC) 02/14/2022   Chronic  indwelling Foley catheter 02/14/2022   Closed hip fracture (HCC) 08/15/2021   DVT (deep venous thrombosis) (HCC) 08/15/2021   Quadriplegia (HCC) 08/15/2021   Fall 08/15/2021   Constipation due to slow transit 08/31/2018   Trauma 06/05/2018   Neuropathic pain 06/05/2018   Neurogenic bowel 06/05/2018   Vaginal yeast infection 01/30/2018   Healthcare-associated pneumonia 01/25/2018   Chronic allergic rhinitis 01/24/2018   Depression with anxiety 01/24/2018   UTI due to Klebsiella species 01/24/2018   Protein-calorie malnutrition, severe (HCC) 01/24/2018   S/P insertion of IVC (inferior vena caval) filter 01/24/2018   Tetraparesis (HCC) 01/20/2018   Neurogenic bladder 01/20/2018   Reactive depression    Benign essential HTN    Acute postoperative anemia due to expected blood loss    Central cord syndrome at C6 level of cervical spinal cord (HCC) 11/29/2017   Allergy to alpha-gal 11/25/2016   SCC (squamous cell carcinoma) 04/08/2014   ONSET DATE: 11/29/2017  REFERRING DIAG: Central Cord Syndrome at C6 of the Cervical Spinal Cord, Fall with Bilateral Closed Hip Fractures, with ORIF repair with Intramedullary Nailing of the Left Hip Fracture.   THERAPY DIAG:  Muscle weakness (generalized)  Other lack of coordination  Rationale for Evaluation and Treatment: Rehabilitation  SUBJECTIVE:  SUBJECTIVE STATEMENT:  Pt. was accompanied by her daughter during therapy today.  Pt accompanied by: self, Personal Care aide  PERTINENT HISTORY: Central Cord Syndrome at C6 of the Cervical Spinal Cord, Fall with Bilateral Closed Hip Fractures, with  ORIF repair with Intramedullary Nailing of the Left Hip Fracture.   PRECAUTIONS: None  WEIGHT BEARING RESTRICTIONS: No  PAIN:  Are you having pain? No pain   LIVING ENVIRONMENT: Lives with: [facility] Stairs: No Has following equipment at home: Wheelchair (power)  PLOF: Independent   PATIENT GOALS:  See below for  established goals  OBJECTIVE:  Note: Objective measures were completed at Evaluation unless otherwise noted.  HAND DOMINANCE: Right  ADLs: Caregiver assist with ADLs, and [facility]  MOBILITY STATUS: Uses a power w/c  ACTIVITY TOLERANCE: Activity tolerance: Fair  FUNCTIONAL OUTCOME MEASURES:  Measurements:   12/09/2021:   Shoulder flexion: R: 120(136), L: 138(150) Shoulder abduction: R: 108(120), L: 110(120) Elbow: R: 0-148, L: 0-146 Wrist flexion: R: 55(62), L: 78 Writ extension: R: 34(55), L: 3(4) RD: R: 12(20), L: 16(26) UD: R: 8(24), L: 6(18) Thumb radial abduction: R: 11(20), L: 8(20) Digit flexion to the [facility]: R:  2nd: 5cm(3.5cm), 3rd: 7.5 cm(5cm), 4th: 7cm(3cm), 5th: 6cm(5.5cm) L: 2nd: 8cm(8cm), 3rd: 8cm(8cm), 4th: 7.5cm(7cm), 5th: 7cm(7cm)   02/03/2022:   Shoulder flexion: R: 122(138), L: 148(154) Shoulder abduction: R: 109(125), L: 125(125 Elbow: R: 0-148, L: 0-146 Wrist flexion: R: 60(68), L: 79 Writ extension: R: 40(55), L: 3(10) RD: R: 14(24), L: 20(28) UD: R: 8(24), L: 6(18) Thumb radial abduction: R: 20(25), L: 8(20) Digit flexion to the [facility]: R:  2nd: 4.5cm(3cm), 3rd: 6.5cm(4.5cm), 4th: 6.5 cm(3cm), 5th: 4.5cm(5cm) L: 2nd: 8cm(8cm), 3rd: 8cm(7cm), 4th: 7.5cm(7cm), 5th: 6.5cm(6.5cm)   04/07/2022:   Shoulder flexion: R: 125(150), L: 160(160) Shoulder abduction: R: 109(125), L: 125(125 Elbow: R: 0-148, L: 0-146 Wrist flexion: R: 60(68), L: 79 Writ extension: R: 40(55), L: 5(18) RD: R: 14(24), L: 20(28) UD: R: 18(24), L: 8(18) Thumb radial abduction: R: 20(25), L: 8(20) Digit flexion to the [facility]: R:  2nd: 4.5 cm(4 cm), 3rd: 6.5cm(3cm), 4th: 7 cm (5cm), 5th: 5cm(5cm) L: 2nd: 8cm(8cm), 3rd: 8cm(7cm), 4th: 7.5cm(7cm), 5th: 7cm(7cm)   07/05/2022:   Shoulder flexion: R: 125(154), L: 160(160) Shoulder abduction: R: 95(130), L: 143(143) Elbow: R: 0-148, L: 0-146 Wrist flexion: R: 60(68), L: 79 Writ extension: R: 40(60), L: 0(12) RD: R: 10(24), L:  12(20) UD: R: 16(24), L: 8(16) Thumb radial abduction: R: 20(25), L: 8(20) Digit flexion to the [facility]: R:  2nd: 4 cm(4cm), 3rd: 6.5cm( 3.5cm), 4th: 7 cm (5cm), 5th: 6cm(5cm) L: 2nd: 8cm(8cm), 3rd: 8cm(7cm), 4th: 8cm(7cm), 5th: 7cm(7cm)   09/13/2022:   Shoulder flexion: R: 125(154), L: 160(160) Shoulder abduction: R: 100(130), L: 143(143) Elbow: R: 0-150, L: 0-150 Wrist flexion: R: 55(68), L: 79 Writ extension: R: 45(60), L: 0(12) RD: R: 10(24), L: 12(20) UD: R: 16(24), L: 8(16) Thumb radial abduction: R: 20(25), L: 8(20) Digit flexion to the [facility]: R:  2nd: 4 cm(4cm), 3rd: 6cm( 5cm), 4th: 6 cm (4cm), 5th: 5cm(5cm) L: 2nd: 7cm(cm), 3rd: 7cm(6cm), 4th: 7cm(6cm), 5th: 6cm(6cm)   10/27/2022   Shoulder flexion: R: 128(154), L: 160(160) Shoulder abduction: R: 105(130), L: 143(143) Elbow: R: 0-150, L: 0-150 Wrist flexion: R: 55(68), L: 79 Writ extension: R: 46(64), L: 0(20) RD: R: 10(20), L: 18(24) UD: R: 18(28), L: 8(20) Thumb radial abduction: R: 30(42), L:20(24) Digit flexion to the [facility]: R:  2nd: 4 cm(3cm), 3rd: 6cm( 6cm), 4th: 6 cm (5cm), 5th: 5cm(5cm) L: 2nd: 8cm(8cm), 3rd: 8cm(6cm), 4th: 8cm(7cm), 5th: 6cm(6cm)  01/19/2023  Shoulder flexion: R: 131(154), L: 160(160) Shoulder abduction: R: 110(130), L: 143(143) Elbow: R: 0-150, L: 0-150 Wrist flexion: R: 55(68),  L: 79 Writ extension: R: 46(64), L: 0(20) RD: R: 10(20), L: 18(24) UD: R: 18(28), L: 14(20) Thumb radial abduction: R: 38(48), L:24(26) Thumb IP flexion: R: 35(50) L: 20(45) Digit flexion to the [facility]: R:  2nd: 3.5 cm(3cm), 3rd: 6cm( 6cm), 4th: 6 cm (5cm), 5th: 5cm(4.5cm) L: 2nd: 8cm(7.5cm), 3rd: 8cm(6cm), 4th: 7.5cm(6cm), 5th: 6cm(4.5cm)  04/04/2023  Shoulder flexion: R: 134(154), L: 160(160) Shoulder abduction: R: 114(130), L: 143(143) Elbow: R: WNL, L: WNL Wrist flexion: R: 55(68), L: 80 Writ extension: R: 46(64), L: -10(10) RD: R: 14(24), L: 20(24) UD: R: 20(30), L: 14(20) Thumb radial abduction: R: 38(48),  L:28(30) Thumb IP flexion: R: 40(50) L: 25(45) Digit flexion to the [facility]: R:  2nd: 3.5 cm(3cm), 3rd: 6cm( 5cm), 4th: 6 cm (4cm), 5th: 5cm(5cm) L: 2nd: 8cm(7.5cm), 3rd: 8cm(7cm), 4th: 7.5cm(7cm), 5th: 7cm(6cm)  05/25/2023  Shoulder flexion: R: 134(154), L: 160(160) Shoulder abduction: R: 117(134), L: 143(143) Elbow: R: WNL, L: WNL Wrist flexion: R: 55(68), L: 80 Wrist extension: R: 46(64), L: -8(14) RD: R: 14(24), L: 20(24) UD: R: 20(32), L: 14(22) Thumb radial abduction: R: 38(50), L:28(30) Thumb IP flexion: R: 40(50) L: 30(45) Digit flexion to the [name]: R:  2nd: 3.5 cm(3cm), 3rd: 6cm( 5cm), 4th: 7 cm (4cm), 5th: 5cm(4cm) L: 2nd: 8cm(7.5cm), 3rd: 8cm(7cm), 4th: 7.5cm(7cm), 5th: 7cm(6cm)  07/13/23  Shoulder flexion: R: 134(154), L: 160(160) Shoulder abduction: R: 122(134), L: 143(143) Elbow: R: WNL, L: WNL Wrist flexion: R: 55(68), L: 80 Wrist extension: R: 50(64), L: -4(18) RD: R: 18(24), L: 24(24) UD: R: 20(32), L: 14(22) Thumb radial abduction: R: 38(50), L:32(30) Thumb IP flexion: R: 42(50) L: 30(45) Digit flexion to the [facility]: R:  2nd: 3.5 cm(3cm), 3rd: 6cm( 5cm), 4th: 7 cm (4cm), 5th: 5cm(4cm) L: 2nd: 8cm(7.5cm), 3rd: 8cm(7cm), 4th: 7.5cm(7cm), 5th: 7cm(6cm)  11/02/23:   Shoulder flexion: R: 136(154), L: 160(160) Shoulder abduction: R: 123(134), L: 143(143) Elbow: R: WNL, L: WNL Wrist flexion: R: 55(68), L: 80 Wrist extension: R: 30(38), L: -16(18) RD: R: 8(18), L: 18(24) UD: R: 12(24), L: 8(22) Thumb radial abduction: R: 28(50), L:28(30) Thumb IP flexion: R: 42(55) L: 10(25) Digit flexion to the [facility]: R:  2nd: 4.5cm(4cm), 3rd: 6cm( 4.5cm), 4th: 6.5cm (5cm), 5th: 5cm(4.5cm) L: 2nd: 8cm(7.5cm), 3rd: 7.5cm(7cm), 4th: 7cm(7cm), 5th: 6cm(6cm)    COORDINATION:   01/19/2023:  9 hole peg test:  Right: 2 pegs placed in 1 min. & 5 sec.  Pt. was able to remove 9 pegs in 17 sec.  Left: 2 pegs placed in 1 min. & 10 sec. Pt. was able to remove 9 pegs in 20  sec.  04/04/23:  Pt. was able to consistently grasp the pegs however each one slipped out of her fingers when attempting to place them into the Pegboard.  05/25/2023:   Right: 9 pegs placed and removed in 2 min. & 41 sec. Left: Unable to grasp the pegs for a horizontal position.  07/13/23    Right: 9 pegs placed and removed in 2 min. & 11 sec.     11/02/23:                Right 9 Hole Peg Test: 2 min. & 51 sec.   SENSATION: Intact  EDEMA: N/A   COGNITION: Overall cognitive status: WNL   VISION: Subjective report: Wears glasses, No changes in vision.  PERCEPTION: Intact  TODAY'S TREATMENT:  DATE:  11/02/23     Measurements were obtained, and goals were reviewed with the Pt.    PATIENT EDUCATION: Education details: Bilateral hand  ROM, condition management Person educated: Patient Education method: Explanation, Demonstration, and Verbal cues Education comprehension: verbalized understanding and returned demonstration  HOME EXERCISE PROGRAM: Continue to assess HEP needs, and provide as needed.   GOALS: Goals reviewed with patient? Yes  LONG TERM GOALS: Target date: 01/25/2024  3.  Patient will independently use a reacher to pick up items from various surfaces.  Baseline: 11/02/23: Pt. is now able to consistently pick up small objects from the floor with a reacher. 07/13/2023: Pt. is starting to use a reacher to retrieve items with an adaptation/modification to the trigger. 05/25/2023:  Pt. is now able to initiate grasping a reacher to open and close the reacher with the right hand, however requires the grasp handle to be repositioned  within the hand. Pt. requires work on using/moving the reacher with the right hand through various planes. 04/04/2023: Pt. continues to work on improving Digit flexion to the [facility] to be able to efficiently handle Adaptive  equipment. 01/19/2023: Pt. Is improving with Bilateral digit flexion to the [facility]. Pt. has difficulty firmly holding adaptive devices. 12/06/2022: Pt. continues to present with increased MP, PIP, and DIP digit tightness/stiffness limiting the formulation of bilateral composites fists. 10/27/2022: Pt. presents with increased MP, PIP, and DIP digit tightness/stiffness limiting the formulation of bilateral composites fists during this progress reporting period limiting. Digit flexion to the [facility]: R:  2nd: 4 cm(3cm), 3rd: 6cm( 6cm), 4th: 6 cm (5cm), 5th: 5cm(5cm), L: 2nd: 8cm(8cm), 3rd: 8cm(6cm), 4th: 8cm(7cm), 5th: 6cm(6cm) 09/20/2022: Improving digit flexion to the [facility]. 09/13/22: improved digit flexion to the [facility]. 07/05/2022: Pt. presents with digit MP, PIP, and DIP extensor tightness limiting her ability to securely grip objects in her bilateral hands.  04/07/2022: Pt. Has improved with right 2nd, and 3rd digit flexion towards the [facility]. Pt. Continues to have difficulty securely holding and applying deodorant.02/03/2022: Pt. has improved with digit flexion, however, continues to have difficulty securely holding and using deodorant. 12/09/2021: Bilateral hand/digit MP, PIP, and DIP extension tightness limits her ability achieve digit flexion to hold, and apply deodorant. 10/28/2021:  Pt. continues to have difficulty holding the deodorant. 01/12/2021: Pt. presents with limited digit extension. Pt. Is able to initiate holding deodorant, however is unable to hold it while using it  Goal status:  Achieved   4.  Pt. Will improve bilateral wrist extension in preparation for anticipating, and initiating reaching for objects at the table.  Baseline: 11/02/23: Wrist extension: R: 30(38), L: -16(18) 07/13/23: Wrist extension: R: 50(64), L: -4(18) 05/25/23: Wrist extension: R: 46(64), L: -8(14) 04/04/23: R: 46(64) L: -10(10)01/19/2023:  R: 46(64) L: 0(20) 12/06/2022: Pt continues to to progress with bilateral wrist extension in preparation  fro functional reaching. 10/27/2022: Pt. Is improving with bilateral wrist extension. R: 46(64) L: 0(20) 09/20/2022: limited PROM in the Left wrist extension 2/2 tightness/stiffness. 09/13/22: R: 55(68), L: 0(12) 07/19/2022: Bilateral wrist extension in limited. 07/05/2022: Right: 40(60) Left: 0(12) 04/07/2022: Right: 40(55) Left: 5(18) 02/03/2022: Right: 34(55) Left: 3(4) 12/09/2021: Right  34(55)  Left  3(4) 10/28/2021: Right 22(38), Left 0(15) 09/14/2021:  Right: 17(35), left 2(15)  Goal status: Ongoing  7.  Pt. Will increase bilateral lateral pinch strength by 2 lbs to be able to securely grasp items during ADLs, and IADL tasks.  Baseline: 01/19/2023: Deferred as the Pinch meter is out for calibration.12/06/2022: Pt.  Is able to more securely hold objects during ADLs/IADLs 10/27/2022: Pt. Is improving with holding items with her bilateral thumbs. (Pinch meter out for calibration) 09/20/2022: lateral pinch continues to be limited 09/13/22: R 6.5# L 4# 07/19/2022: TBD 07/05/2022: TBD 07/2022: NT-Pinch meter out for calibration. 02/03/2022: Right: 6#, Left: 4# 12/09/2021: Right: 6#, Left: 4#  Goal status: Deferred   9.  Pt. will complete plant care with modified independence.  Baseline:12/06/2022: Pt. Is independent with plant care in her husband's room, however is not able to access her plants due to furniture blocking access to them. 10/27/2022: Pt. Is able to water plants that are closer, and within reach. Pt. continues to have difficulty reaching for thorough plant care. 09/20/2022: Pt. Continues to have difficulty reaching for thorough plant care. 09/13/22: continues to report intermittent difficulty 07/19/2022: Pt. Continues to be able to water, and care for some of her plants. Pt. Has more difficulty with plants that are harder to reach. 07/05/2022: Pt. is able to water, and care for some of her plants. Pt. Has more difficulty with plants that are harder to reach. 04/07/2022: Pt. is now able to hold a cup and water her  plants.02/03/2022: Pt. has difficulty caring for her plants.  Goal status:Achieved   10.  Pt. will demonstrate adaptive techniques to assist with the efficiency of self-dressing, or morning care tasks.  Baseline: 07/13/23:Pt. continues to work on improving UE functioning to be able to efficiently use Adaptive equipment 05/25/2023: Pt. continues to work on improving UE functioning to be able to efficiently use Adaptive equipment.  04/04/2023:  Pt. continues to work on improving UE functioning to be able to efficiently use Adaptive equipment. 01/19/2203: Pt. Is able to donn her jacket in reverse independently. Pt. requires assist from staff, as Pt. Reports staff have decreased time. 12/06/2022: Pt. requires assist from staff, as Pt. Reports staff have decreased time. 10/27/2022: Pt. is able to assist with initiating UE dressing. Pt. requires assist from personal, and staff care aides. 09/20/2022: Pt. continues to require assist form personal, and staff care aides. 09/13/22: MODA dressing, reports she is often rushed 07/19/2022: Pt. continues to require assist with self-dressing/morning care tasks. 07/05/2022: Continue with goal. 04/07/2022: Pt. Continues to require assist with the efficiency of self-dressing, and morning care tasks.02/03/2022: Pt. requires assist from caregivers 2/2 time limitations during morning care.  Goal status:  Deferred- Has caregiver assist and morning routine in place.  11.  Pt. will improve BUE strength to improve ADL, and IADL functioning.  Baseline: 11/02/23: flexion L 4-/5, R 4-/5; shoulder abduction L 4-/5, R 4-/5. elbow flexion B 4/5,01/19/2023: L 4/5, R 3+/5  07/13/23: flexion L 4/5, R 4-/5; shoulder abduction L 4+/5, R 4-/5. elbow flexion B 5/5, elbow extension 4+/5 01/19/2023: L 4/5, R 3+/5  05/25/2023: shoulder flexion L 4/5, R 4-/5; shoulder abduction L 4+/5, R 4-/5. elbow flexion B 5/5, elbow extension 4+/5 01/19/2023: L 4/5, R 3+/5  04/04/2023: shoulder flexion L 4/5, R 3+/5; shoulder  abduction L 4+/5, R 3+/5. elbow flexion B 5/5, elbow extension 4/5 01/19/2023: L 4/5, R 3+/5 12/06/2022: Continue 10/27/2022: shoulder flexion L 4/5, R 3+/5; shoulder abduction L 4+/5, R 3+/5. elbow flexion B 5/5, elbow extension 4/5 09/20/2022: Pt. Presents with limited BUE strength 09/13/22: shoulder flexion L 4/5, R 3/5; elbow flexion B 5/5, elbow extension 4-/5; shoulder abduction L 4+/5, R 3+/5. 07/19/2022: BUE strength continues to be limited. 07/05/2022: shoulder flexion: right 4-/5, abduction: 3+/5, elbow flexion: right: 5/5, left 5/5, extension:  right: 4-/5, left 4-/5, wrist extension: right: 3-/5, left: 2-/5  Goal status: Ongoing  12.  Pt. will improve bilateral thumb radial abduction in order to be able to hold the grab bars while standing with PT  Baseline: 11/02/23: Thumb radial abduction: R: 28(50), L:28(30) 07/13/23: Pt. Is improving with holding the grab bars with the right hand .05/25/23: Thumb radial abduction: R: 38(50), L:28(30) 04/04/23: R: 38(48), L:28(30) 01/19/2023: R: 38(48) L: 24(26)12/06/2022: Pt. Continues to present  with limited thumb abduction, however is improving holding onto the parallel bars.10/27/2022: thumb radial abduction: R: 30(42), L: 20(24  Goal status: Ongoing  13.  Pt. Will be able to securely hold, and stabilize medication bottles at a tabletop surface when opening, and closing them.  Baseline: 01/19/2023: Pt. Is now able to securely hold medication bottles while opening them, and stabilizes them on surfaces while opening them. 12/06/2022: Pt is improving with securely stabilizing medication bottles. 10/27/2022: Pt. Stabilizes bottles against her torso when attempting to open, and close them   Goal status: Achieved  14: Pt. Will improve bilateral thumb IP flexion to improve active grasping patterns.    Baseline:11/02/23: Thumb IP flexion: R: 42(55) L: 10(25) 07/13/23: Thumb IP flexion: R: 42(50) L: 30(45) 05/25/2023: Thumb IP flexion: R: 40(50) L: 30(45) 04/04/2023:  R:  40(50) L: 25(45) 01/19/2023: Right 35(50), Left: 20/45)    Goal status: Ongoing    15. Pt. Will improve bilateral FMC/speed, and dexterity. As evidence by improved scores on the 9 hole peg test. Baseline: 11/02/23: 2 min. & 51 sec. 07/13/2023: Right: 9 pegs placed and removed in 2 min. & 11 sec. 05/25/2023: 9 Hole pegs test speed/dexterity: Right: 9 pegs placed and removed in 2 min. & 41 sec. Left: Unable to grasp the pegs for a horizontal position. 04/04/23: Pt. was able to consistently grasp the pegs however each one slipped out of her fingers when attempting to place them into the Pegboard. 01/19/2023: 2 pegs placed in 1 min. & 5 sec.  Pt. was able to remove 9 pegs in 17 sec.; Left: 2 pegs placed in 1 min. & 10 sec. Pt. was able to remove 9 pegs in 20 sec.   Goal status: Ongoing    16. Pt. Will donn/doff a jacket with modified independence   Baseline: 11/02/23: MaxA   Goal status: New     ASSESSMENT:  CLINICAL IMPRESSION:  Pt. BP 125/78 HR 79 bpms. Pt. has returned for therapy after having had an extended illness with COVID. Pt. is now feeling better and ready to resume therapy. Measurements were obtained, and goals were reviewed with the patient. Pt. progress has been limited over this past recertification 2/2 weakness, and bilateral hand stiffness following the extended illness. Pt. has achieved the goal for using a reacher to pick up items from the floor. A new goal was added for donning/doffing a jacket. Will plan to continue focusing on bilateral hand ROM, UE strengthening, and [facility] skills to maximize independence with ADLs, and IADLs. Pt. continues to benefit from OT services to work on impoving ROM, and UE strength in order to work towards increasing bilateral hand grasp on objects, and increasing engagement of the bilateral hands during ADLs, and IADL tasks.      PERFORMANCE DE PERFORMANCE DEFICITS: in functional skills including ADLs, IADLs, coordination, dexterity, ROM, strength, and UE  functional use, cognitive skills including , and psychosocial skills including coping strategies and environmental adaptation.   IMPAIRMENTS: are limiting patient from ADLs, IADLs, and leisure.  CO-MORBIDITIES: may have co-morbidities  that affects occupational performance. Patient will benefit from skilled OT to address above impairments and improve overall function.  MODIFICATION OR ASSISTANCE TO COMPLETE EVALUATION: Min-Moderate modification of tasks or assist with assess necessary to complete an evaluation.  OT OCCUPATIONAL PROFILE AND HISTORY: Detailed assessment: Review of records and additional review of physical, cognitive, psychosocial history related to current functional performance.  CLINICAL DECISION MAKING: Moderate - several treatment options, min-mod task modification necessary  REHAB POTENTIAL: Good for stated goals  PLAN:  OT FREQUENCY 2x's a week  OT DURATION: 12 weeks  PLANNED INTERVENTIONS ADL training, A/E training, UE ther. Ex, Manual therapy, neuromuscular re-education, moist heat modality, Paraffin Bath, Splinting, and  Pt./caregiver education   RECOMMENDED OTHER SERVICES: PT  CONSULTED AND AGREED WITH PLAN OF CARE: Patient  PLAN FOR NEXT SESSION: Continue Treatment as per established POC  Richardson Otter, MS, OTR/L  11/02/23, 1:33 PM

## 2023-11-02 NOTE — Therapy (Signed)
 OUTPATIENT PHYSICAL THERAPY NEURO TREATMENT  Patient Name: Sherry [name] MRN: [id number] DOB:[birth year], 89 y.o., female Today's Date: 11/03/2023   PCP: Sherry Brigham, MD REFERRING PROVIDER: Cleotilde [name]   PT End of Session - 11/02/23 1150     Visit Number 127    Number of Visits 150    Date for Recertification  12/28/23    Progress Note Due on Visit 130    PT Start Time 1146    PT Stop Time 1227    PT Time Calculation (min) 41 min    Equipment Utilized During Treatment Gait belt    Activity Tolerance Other (comment);Patient tolerated treatment well    Behavior During Therapy [facility] for tasks assessed/performed                           Past Medical History:  Diagnosis Date   Acute blood loss anemia    Arthritis    Cancer (HCC)    skin   Central cord syndrome at C6 level of cervical spinal cord (HCC) 11/29/2017   Hypertension    Protein-calorie malnutrition, severe 01/24/2018   S/P insertion of IVC (inferior vena caval) filter 01/24/2018   Tetraparesis (HCC)    Past Surgical History:  Procedure Laterality Date   ANTERIOR CERVICAL DECOMP/DISCECTOMY FUSION N/A 11/29/2017   Procedure: Cervical five-six, six-seven Anterior Cervical Decompression Fusion;  Surgeon: Sherry Debby DELENA, MD;  Location: [facility] OR;  Service: Neurosurgery;  Laterality: N/A;  Cervical five-six, six-seven Anterior Cervical Decompression Fusion   CATARACT EXTRACTION     FEMUR IM NAIL Left 08/16/2021   Procedure: INTRAMEDULLARY (IM) NAIL FEMORAL;  Surgeon: Sherry Barrio, MD;  Location: [facility] ORS;  Service: Orthopedics;  Laterality: Left;   IR IVC FILTER PLMT / S&I /IMG GUID/MOD SED  12/08/2017   Patient Active Problem List   Diagnosis Date Noted   Chronic pain syndrome 08/25/2023   Orthostatic hypotension 08/25/2023   Age-related osteoporosis without current pathological fracture 07/27/2022   Mild recurrent major depression 07/27/2022   Hypertensive heart disease with other  congestive heart failure (HCC) 02/14/2022   Chronic indwelling Foley catheter 02/14/2022   Closed hip fracture (HCC) 08/15/2021   DVT (deep venous thrombosis) (HCC) 08/15/2021   Quadriplegia (HCC) 08/15/2021   Fall 08/15/2021   Constipation due to slow transit 08/31/2018   Neuropathic pain 06/05/2018   Neurogenic bowel 06/05/2018   Healthcare-associated pneumonia 01/25/2018   Chronic allergic rhinitis 01/24/2018   Depression with anxiety 01/24/2018   Protein-calorie malnutrition, severe 01/24/2018   S/P insertion of IVC (inferior vena caval) filter 01/24/2018   Tetraparesis (HCC) 01/20/2018   Neurogenic bladder 01/20/2018   Reactive depression    Benign essential HTN    Acute postoperative anemia due to expected blood loss    Central cord syndrome at C6 level of cervical spinal cord (HCC) 11/29/2017   Allergy to alpha-gal 11/25/2016   SCC (squamous cell carcinoma) 04/08/2014    ONSET DATE: 08/15/2021 (fall with B hip fx); Initial injury was in 2019- quadraplegia due to central cord syndrome of C6 secondary to neck fracture.   REFERRING DIAG: Bilateral Hip fx; Left Open- s/p Left IM nail femoral on 08/16/2021; Right closed  THERAPY DIAG:  Muscle weakness (generalized)  Other lack of coordination  Other abnormalities of gait and mobility  Difficulty in walking, not elsewhere classified  Unsteadiness on feet  Closed fracture of both hips, initial encounter [name])  Rationale for Evaluation and Treatment Rehabilitation  SUBJECTIVE:  SUBJECTIVE STATEMENT:   Patient reports she has really been through it- Just getting over a bad case of COVID and states missing Monday due to her husband had a stroke. States still not 100% but will try to see what she can do.       Pt accompanied by: Paid caregiver and dtr-   PERTINENT HISTORY: Sherry [name] is an 87yoF who experienced a fall at home on 08/15/2021 with hip fracture, s/p Left hip ORIF. PMH: quadriplegia due to central cord  syndrome of C6 secondary to neck fracture, HTN, depression with anxiety, neurogenic bladder, bilateral DVT on Xarelto  (s/p of IVC filter placement). Prior history of significant PT/OT with improvement of function. Pt has full use of shoulder/elbows, limited use of hands, adaptive self feeding sucessful.     PAIN:  None current but does endorse some left thigh pain intermittent  OBJECTIVE:   TODAY'S TREATMENT:   11/02/23     THEREX:   PROM to each Hip/knee x several min- flex/ext/abd/add/knee flex/ext (increased stiffness throughout each hip and extensor tone LLE)   AROM Hip flex, ABD/ADD, knee flex and knee 3 sets of 10 reps  Manual resistive leg press 2 x 10 reps   Ham sets with manual resistance- 3 sec hold x 10 reps   PATIENT EDUCATION: Education details: Exercise technique Patient verbalized understanding.   HOME EXERCISE PROGRAM: No changes at this time  Access Code: [name] [employer] URL: [email or web address] Date: 11/23/2021 Prepared by: Sherry  [name] Exercises - Seated Gluteal Sets - 2 x daily - 7 x weekly - 2 sets - 10 reps - 5 hold - Seated Quad Set - 2 x daily - 7 x weekly - 2 sets - 10 reps - 5 hold - Seated Long Arc Quad - 2 x daily - 7 x weekly - 2 sets - 10 reps - 5 hold - Seated March - 2 x daily - 7 x weekly - 2 sets - 10 reps - 5 hold - Seated Hip Abduction - 2 x daily - 7 x weekly - 2 sets - 10 reps - 5 hold - Seated Shoulder Shrugs - 2 x daily - 7 x weekly - 2 sets - 10 reps - 5 hold - Wheelchair Pressure Relief - 2 x daily - 7 x weekly - 2 sets - 10 reps - 5 hold     GOALS: Goals reviewed with patient? Yes  SHORT TERM GOALS: Target date: 02/22/2022  Pt will be independent with initial UE strengthening HEP in order to improve strength and balance in order to decrease fall risk and improve function at home and work. Baseline: 10/19/2021- patient with no formal UE HEP; 12/02/2021= Patient verbalized knowledge of HEP including use of theraband for UE strengthening.   Goal status: GOAL MET  LONG TERM GOALS: Target date: 12/28/2023   Pt will be independent with final for UE/LE HEP in order to improve strength and balance in order to decrease fall risk and improve function at home and work. Baseline: Patient is currently BLE NWB and unable to participate in HEP. Has order for UE strengthening. 01/11/2022- Patient now able to participate in LE strengthening although NWB still- good understanding for some basic exercises. Will keep goal active to incorporate progressive LE strengthening exercises. 04/07/2022= Patient still participating in progressive LE seated HEP and has no questions at this time.  Goal status: MET  2.  Pt will improve FOTO to target score of 35  to display perceived improvements in ability to complete ADL's.  Baseline: 10/19/2021= 12; 12/02/2021= 17; 01/11/2022=17; 04/07/2022= 17; 07/05/2022=17; 10/06/2022- outcome measure not appropriate as patient in non-ambulatory and not consistently standing. Goal status: Goal not appropriate  3.  Pt will increase strength of B UE  by at least 1/2 MMT grade in order to demonstrate improvement in strength and function  Baseline: patient has range of 2-/5 to 4/5 BUE Strength; 12/02/2021= 4/5 except with wrist ext. 01/11/2022= 4/5 B UE strength expept for wrist Goal status: Goal revised-no longer appropriate- working with OT on all UE strengthening.   4. Pt. Will increase strength of RLE by at least 1/2 MMT grade in order to demonstrate improvement in Standing/transfers.  Baseline: 2-/5 Right hip flex/knee ext/flex; 04/07/2022= 2- with right hip flex/knee ext (lacking 28 deg from zero) 4/8= Patient able to ext right knee lacking 18 deg from zero. 07/05/2022=Left knee approx 8 deg from zero and right knee = 23 deg from zero; 12/29/2022 - Left LE = full ROM 4-/5 and right knee ext= 23 deg from zero= 2+/5 (gravity minimized); 03/28/2023= Left knee ext=  4/5 and right knee ext= 3-/5 (able to move lower leg through incomplete ROM  -lacking 20 deg from full ROM against gravity); 05/23/2023= right knee ext= 3-/5 (able to move lower leg through incomplete ROM -lacking 17 deg from full ROM against gravity); 06/20/2023= Right knee ext= lacking 20 deg from zero; 10/05/2023- lacking 24 deg from zero RLE Knee ext  Goal status: Progressing  5. Pt. Will demo ability to stand pivot transfer with max assist for improved functional mobility and less dependent need on mechanical device.   Baseline: Dependent on hoyer lift for all transfers; 04/07/2022- Unable to test secondary to patient with recent UTI and right hand procedure - will attempt to reassess next visit. ; 04/12/22: Unable to successfully stand due to feet plates on loaned power w/c; 05/10/2022- Patient still having to use rental chair and has not received her original power w/c back to practice SPT. 07/04/2021= Patient just received new power w/c and attempted standing from 1st time today- Patient able to stand with max assist today from w/c and did not attempt to perform pivot transfer- difficulty with static standing and placing weight on right LE. 10/06/2022- Not assessed today- patient needs new AFO prior to attempting transfers; 12/29/2022= Patient has not attempted SPT yet - still trying to improve LE strength enough to stand well without knee buckling with weight shifting. 03/28/2023- not attempted today due to ill fitting brace but patient last performed SPT on 01/10/23 and was able to perform with Max A with feet pre-positioned to angle toward direction of transfer; 05/23/2023- Not tested today but on last visit- patient able to perform SPT with max assist yet concern for twisting knee as she was unable to turn/pivot foot .  06/20/2023- deferred to next session due to patient fatigued with static standing today. 10/05/2023-Patient able to SPT with Max assist yet still unable to move LE to pivot so transfers are performed very slowly.   Goal status: MET  6. Pt. Will demonstrate improved functional  LE strength as seen by ability to stand > 2 min for improved transfer ability and pregait abilities.   Baseline: not assessed today due to recent dx: UTI; 04/07/2022- Unable to test secondary to patient with recent UTI and right hand procedure - will attempt to reassess next visit; 04/12/22: Unable to successfully stand due to feet plates on loaned power w/c 05/10/2022- Patient still having to use rental chair and has not  received her original power w/c back to practice Standing. 07/05/2022- Patient able to stand with max assist at trunk today for 2 min 3 sec. Will keep goal active to ensure consistency. 10/06/2022= Unable to assess today- patient with some recent blood pressure issues and needs new AFO. 11/24/2022= Patient demonstrated multiple rounds of standing in // bars- holding up to 1:45 min today but has been able to exhibit 2 min in recent past- working on consistency. 12/29/2022= 2 min 40 sec with patient demonstrating improved overall posture and ability to contract glutes and quads with just min assist at trunk today.   Goal status: MET  7. Pt. Will demonstrate improved functional LE strength as seen by ability to stand > 5 min for improved transfer ability and pregait abilities. Baseline: 12/29/2022= 2 min 40 sec with patient demonstrating improved overall posture and ability to contract glutes and quads with just min assist at trunk today. 03/28/2023= Attempted 2 trials today - at best 2 min 48 sec today but during cert- did stand for 3 min 44 sec. 06/20/2023= Patient able to stand 2x (45 sec and 1 min 12 sec)- max assist yet no slippage of LLE.  07/13/2023= Patient able to stand x 3 trials today- each 1-2 min focusing on posture- patient with improving gluteal activation and less physical assist once in standing position. 10/05/2023= Patient able to stand for 1 min 11 sec today- Left knee buckled.  Goal Status: Ongoing  8. Patient will perform sit to stand transfer with moderate assist consistently > 75%  of time for improved transfer ability and less dependence on caregiver.   Baseline: 12/29/2022- Patient currently max assist level assistance with all sit to stand activities. 03/28/2023= Patient is still majority of max assist to stand yet able to pull up from wheelchair smoother in one motion. 06/20/2023- Max Assist but has only most recently resumed standing since last week so goal still appropriate for new cert; 3/88/7974=dupoo max assist but less overall assist on last trial of standing with improved forward lean and gluteal activation. 10/05/2023= Patient able to transfer with Max A with inability to pivot/move her LE - Increased risk of injury.   Goal status: ONGOING  9. 4. Pt. Will increase strength of RLE as seen by ability to achieve < 15 deg from zero with R knee ext in order to demonstrate improvement in Standing/transfers.  Baseline: 10/05/2023= lacking 24 deg from zero (active R knee ext - measured sitting in power w/c)  Goal status: NEW    ASSESSMENT:  CLINICAL IMPRESSION:   Patient returns today after prolonged absence from PT due to being sick and husband suffering a stroke earlier this week. She continues to present with increased overall weakness most likely due to recovering from being sick. Attempted many active exercises in seated and will attempt to progress transfers and standing next week. Pt will continue to benefit from skilled PT services to optimize independence and reduced caregiver support.   OBJECTIVE IMPAIRMENTS Abnormal gait, decreased activity tolerance, decreased balance, decreased coordination, decreased endurance, decreased mobility, difficulty walking, decreased ROM, decreased strength, hypomobility, impaired flexibility, impaired UE functional use, postural dysfunction, and pain.   ACTIVITY LIMITATIONS carrying, lifting, bending, sitting, standing, squatting, sleeping, stairs, transfers, bed mobility, continence, bathing, toileting, dressing, self feeding, reach  over head, hygiene/grooming, and caring for others  PARTICIPATION LIMITATIONS: meal prep, cleaning, laundry, medication management, personal finances, interpersonal relationship, driving, shopping, community activity, and yard work  PERSONAL FACTORS Age, Time since onset of injury/illness/exacerbation, and  1-2 comorbidities: HTN, cervical Sx are also affecting patient's functional outcome.   REHAB POTENTIAL: Good  CLINICAL DECISION MAKING: Evolving/moderate complexity  EVALUATION COMPLEXITY: Moderate  PLAN: PT FREQUENCY: 1-2x/week  PT DURATION: 12 weeks  PLANNED INTERVENTIONS: Therapeutic exercises, Therapeutic activity, Neuromuscular re-education, Balance training, Gait training, Patient/Family education, Self Care, Joint mobilization, DME instructions, Dry Needling, Electrical stimulation, Wheelchair mobility training, Spinal mobilization, Cryotherapy, Moist heat, and Manual therapy  PLAN FOR NEXT SESSION:    Continue with LE and core strength training  Sit to stand and static standing trials to promote improved standing. SPT as able.     5:46 PM, 11/03/23  5:46 PM, 11/03/23 Chyrl London, PT Physical Therapist - [name]  Outpatient Physical Therapy- Main Campus [phone]

## 2023-11-07 ENCOUNTER — Ambulatory Visit

## 2023-11-07 ENCOUNTER — Ambulatory Visit: Admitting: Occupational Therapy

## 2023-11-09 ENCOUNTER — Ambulatory Visit: Admitting: Occupational Therapy

## 2023-11-09 ENCOUNTER — Ambulatory Visit

## 2023-11-14 ENCOUNTER — Ambulatory Visit: Admitting: Occupational Therapy

## 2023-11-14 ENCOUNTER — Ambulatory Visit

## 2023-11-15 ENCOUNTER — Encounter: Payer: Self-pay | Admitting: Orthopedic Surgery

## 2023-11-15 NOTE — Progress Notes (Unsigned)
 [facility] Room Number: 219 A Place of Service:  SNF (31) Provider:  Greig CHARLENA Cluster, NP    Patient Care Team: Abdul Fine, MD as PCP - General (Family Medicine)  Extended Emergency Contact Information Primary Emergency Contact: [name],[name] Mobile Phone: [phone] Relation: Daughter Secondary Emergency Contact: Mathias Lynwood HERO Address: [address]          [place], [place] [address] [place] Home Phone: [phone] Mobile Phone: [phone] Relation: Spouse  Code Status:  DNR Goals of care: Advanced Directive information    09/14/2023    1:55 PM  Advanced Directives  Does Patient Have a Medical Advance Directive? No;Yes  Type of Advance Directive Out of facility DNR (pink MOST or yellow form)     Chief Complaint  Patient presents with   Weight Gain    HPI:  Pt is a 89 y.o. female seen today for an acute visit for    Past Medical History:  Diagnosis Date   Acute blood loss anemia    Arthritis    Cancer (HCC)    skin   Central cord syndrome at C6 level of cervical spinal cord (HCC) 11/29/2017   Hypertension    Protein-calorie malnutrition, severe 01/24/2018   S/P insertion of IVC (inferior vena caval) filter 01/24/2018   Tetraparesis (HCC)    Past Surgical History:  Procedure Laterality Date   ANTERIOR CERVICAL DECOMP/DISCECTOMY FUSION N/A 11/29/2017   Procedure: Cervical five-six, six-seven Anterior Cervical Decompression Fusion;  Surgeon: Cheryle Debby LABOR, MD;  Location: [facility] OR;  Service: Neurosurgery;  Laterality: N/A;  Cervical five-six, six-seven Anterior Cervical Decompression Fusion   CATARACT EXTRACTION     FEMUR IM NAIL Left 08/16/2021   Procedure: INTRAMEDULLARY (IM) NAIL FEMORAL;  Surgeon: Cleotilde Barrio, MD;  Location: [facility] ORS;  Service: Orthopedics;  Laterality: Left;   IR IVC FILTER PLMT / S&I /IMG GUID/MOD SED  12/08/2017    Allergies  Allergen Reactions   Other Swelling    Lisinopril-Hydrochlorothiazide Swelling    Swelling of the tongue   Bovine (Beef) Protein-Containing Drug Products Swelling    Facial and lip swelling (due to tick bite)   Dairy Aid [Tilactase] Swelling   Lisinopril-Hydrochlorothiazide Swelling    Tongue swelling   Porcine (Pork) Protein-Containing Drug Products Swelling    Outpatient Encounter Medications as of 11/15/2023  Medication Sig   acetaminophen  (TYLENOL ) 500 MG tablet Take 500 mg by mouth 3 (three) times daily.   acetic acid 0.25 % irrigation Use 30ml via irrigation twice daily to keep foley Cath patent.   alendronate (FOSAMAX) 70 MG tablet Take 70 mg by mouth every Saturday.   calcium  carbonate (CALCIUM  600) 600 MG TABS tablet Take 600 mg by mouth at bedtime.   Capsaicin  0.1 % CREA Apply 1 application  topically every evening. (Apply to hands)   cetirizine (ZYRTEC) 5 MG tablet Take 5 mg by mouth daily.   cholecalciferol  (VITAMIN D3) 25 MCG (1000 UNIT) tablet Take 1,000 Units by mouth daily.   DULoxetine  (CYMBALTA ) 60 MG capsule Take 60 mg by mouth daily.   ferrous sulfate  325 (65 FE) MG tablet Take 325 mg by mouth daily with breakfast.   fluocinonide (LIDEX) 0.05 % external solution Apply 1 Application topically every 12 (twelve) hours as needed.   furosemide (LASIX) 20 MG tablet Take 20 mg by mouth daily.   gabapentin  (NEURONTIN ) 100 MG capsule Take 1 capsule (100 mg total) by mouth at bedtime.   hydroxypropyl methylcellulose /  hypromellose (ISOPTO TEARS / GONIOVISC) 2.5 % ophthalmic solution Place 1 drop into both eyes every hour as needed for dry eyes.   ketoconazole (NIZORAL) 2 % shampoo Apply 1 Application topically as directed. Every shift on Thursday for seborrheic dermatitis flare up   lactulose , encephalopathy, (CHRONULAC ) 10 GM/15ML SOLN Take 30 mLs by mouth daily as needed (mild constipation).   magnesium  citrate solution Take 60-120 mLs by mouth daily as needed (constipation).   Menthol , Topical Analgesic,  (BIOFREEZE COOL THE PAIN) 4 % GEL Apply 1 application  topically every 8 (eight) hours as needed (for left knee pain).   methylcellulose oral powder Take 1 packet by mouth at bedtime.   midodrine  (PROAMATINE ) 5 MG tablet Take 5 mg by mouth every 8 (eight) hours as needed (for SBP < 90).   Multiple Vitamin (MULTIVITAMIN WITH MINERALS) TABS tablet Take 1 tablet by mouth daily.   olopatadine (PATADAY) 0.1 % ophthalmic solution Place 1 drop into both eyes 2 (two) times daily.   ondansetron  (ZOFRAN ) 4 MG tablet Take 4 mg by mouth every 6 (six) hours as needed for nausea or vomiting.   polyethylene glycol (MIRALAX  / GLYCOLAX ) 17 g packet Take 17 g by mouth daily as needed. And daily scheduled in the evening   rivaroxaban  (XARELTO ) 10 MG TABS tablet Take 10 mg by mouth daily.   saccharomyces boulardii (FLORASTOR) 250 MG capsule Take 250 mg by mouth daily.   Saline Spray 0.2 % SOLN Place 2 sprays into both nostrils 3 (three) times daily as needed (nasal congestion).   simethicone  (MYLICON) 80 MG chewable tablet Chew 80 mg by mouth every 6 (six) hours as needed for flatulence.   sodium phosphate  (FLEET) 7-19 GM/118ML ENEM Place 1 enema rectally every other day as needed for severe constipation.   tiZANidine  (ZANAFLEX ) 2 MG tablet Take 1 tablet (2 mg total) by mouth 2 (two) times daily.   triamcinolone  cream (KENALOG ) 0.1 % Apply to thigh and lower legs topically every 8 hours as needed. Apply to back of neck topically as needed.   [DISCONTINUED] midodrine  (PROAMATINE ) 10 MG tablet Take 0.5 tablets (5 mg total) by mouth 3 (three) times daily as needed (Blood Pressure <90/60).   No facility-administered encounter medications on file as of 11/15/2023.    Review of Systems  Immunization History  Administered Date(s) Administered   INFLUENZA, HIGH DOSE SEASONAL PF 11/17/2021   Influenza-Unspecified 11/14/2018, 11/20/2019, 11/17/2020, 11/17/2021, 11/24/2022   Moderna Covid-19 Fall Seasonal Vaccine 48yrs &  older 05/11/2022, 05/13/2023   Moderna Covid-19 Vaccine Bivalent Booster 108yrs & up 06/30/2021, 12/11/2021, 10/29/2022   Moderna Sars-Covid-2 Vaccination 02/09/2019, 03/09/2019, 12/14/2019, 06/20/2020, 10/24/2020   PNEUMOCOCCAL CONJUGATE-20 10/05/2022   PPD Test 05/16/2019   Tdap 11/29/2017   Zoster Recombinant(Shingrix) 09/02/2022, 12/04/2022   Pertinent  Health Maintenance Due  Topic Date Due   Influenza Vaccine  09/02/2023   Mammogram  Discontinued   DEXA SCAN  Discontinued      08/19/2021    8:54 PM 08/20/2021    8:20 AM 03/29/2022   10:11 AM 08/31/2022    2:42 PM 09/20/2023    9:15 AM  Fall Risk  Falls in the past year?   0 0 0  Was there an injury with Fall?   0    Fall Risk Category Calculator   0    (RETIRED) Patient Fall Risk Level High fall risk  High fall risk      Patient at Risk for Falls Due to   No  Fall Risks       Data saved with a previous flowsheet row definition   Functional Status Survey:    Vitals:   11/15/23 1228 11/15/23 1245  BP: (S) (!) 160/90 (!) 148/84  Pulse: 72   Temp: (!) 97.5 F (36.4 C)   SpO2: 92%   Weight: 197 lb 6.4 oz (89.5 kg)   Height: 5' 7 (1.702 m)    Body mass index is 30.92 kg/m. Physical Exam  Labs reviewed: Recent Labs    09/14/23 1400 09/19/23 0000 09/29/23 0000 10/06/23 0000  NA 137 140 141 141  K 3.6 4.2 4.5 4.2  CL 99 100 103 101  CO2 27 34* 34* 32*  GLUCOSE 143*  --   --   --   BUN 13 10 9 10   CREATININE 0.44 0.5 0.5 0.5  CALCIUM  8.7* 8.8 9.2 9.2   Recent Labs    05/09/23 0000 08/29/23 0000 09/14/23 1400  AST 21 17 26   ALT 16 12 18   ALKPHOS 59 53 53  BILITOT  --   --  1.0  PROT  --   --  6.6  ALBUMIN  3.6 3.6 3.6   Recent Labs    08/10/23 0000 08/29/23 0000 09/14/23 1400 10/06/23 0000  WBC 6.3 4.9 7.5 5.5  NEUTROABS 4,038.00 2,528.00  --  3,427.00  HGB 11.8* 12.2 12.6 13.0  HCT 36 37 38.0 41  MCV  --   --  94.1  --   PLT 280 305 285 330   Lab Results  Component Value Date   TSH 1.15  08/29/2023   No results found for: HGBA1C No results found for: CHOL, HDL, LDLCALC, LDLDIRECT, TRIG, CHOLHDL  Significant Diagnostic Results in last 30 days:  No results found.  Assessment/Plan There are no diagnoses linked to this encounter.   Family/ staff Communication:  Labs/tests ordered:

## 2023-11-16 ENCOUNTER — Ambulatory Visit

## 2023-11-16 ENCOUNTER — Ambulatory Visit: Admitting: Occupational Therapy

## 2023-11-16 NOTE — Progress Notes (Signed)
 This encounter was created in error - please disregard.

## 2023-11-17 ENCOUNTER — Non-Acute Institutional Stay (SKILLED_NURSING_FACILITY): Payer: Self-pay | Admitting: Nurse Practitioner

## 2023-11-17 ENCOUNTER — Encounter: Payer: Self-pay | Admitting: Nurse Practitioner

## 2023-11-17 DIAGNOSIS — Z86718 Personal history of other venous thrombosis and embolism: Secondary | ICD-10-CM

## 2023-11-17 DIAGNOSIS — G8250 Quadriplegia, unspecified: Secondary | ICD-10-CM

## 2023-11-17 DIAGNOSIS — M810 Age-related osteoporosis without current pathological fracture: Secondary | ICD-10-CM

## 2023-11-17 DIAGNOSIS — I110 Hypertensive heart disease with heart failure: Secondary | ICD-10-CM | POA: Diagnosis not present

## 2023-11-17 DIAGNOSIS — D509 Iron deficiency anemia, unspecified: Secondary | ICD-10-CM

## 2023-11-17 DIAGNOSIS — R600 Localized edema: Secondary | ICD-10-CM

## 2023-11-17 DIAGNOSIS — I951 Orthostatic hypotension: Secondary | ICD-10-CM

## 2023-11-17 DIAGNOSIS — K5901 Slow transit constipation: Secondary | ICD-10-CM

## 2023-11-17 DIAGNOSIS — N319 Neuromuscular dysfunction of bladder, unspecified: Secondary | ICD-10-CM

## 2023-11-17 DIAGNOSIS — F329 Major depressive disorder, single episode, unspecified: Secondary | ICD-10-CM

## 2023-11-17 DIAGNOSIS — M792 Neuralgia and neuritis, unspecified: Secondary | ICD-10-CM

## 2023-11-17 DIAGNOSIS — I5089 Other heart failure: Secondary | ICD-10-CM

## 2023-11-17 NOTE — Progress Notes (Signed)
 Location:  Other [facility].  Nursing Home Room Number: [facility] [id number] Place of Service:  [phone]) Harlene An, NP  PCP: Abdul Fine, MD  Patient Care Team: Abdul Fine, MD as PCP - General (Family Medicine)  Extended Emergency Contact Information Primary Emergency Contact: [name],[name] Mobile Phone: [phone] Relation: Daughter Secondary Emergency Contact: Mathias Lynwood HERO Address: [address]          [place], [place] [address] [place] Home Phone: [phone] Mobile Phone: [phone] Relation: Spouse  Goals of care: Advanced Directive information    11/17/2023   12:03 PM  Advanced Directives  Does Patient Have a Medical Advance Directive? Yes  Type of Estate agent of [place];Living will;Out of facility DNR (pink MOST or yellow form)  Does patient want to make changes to medical advance directive? No - Patient declined  Copy of Healthcare Power of Attorney in Chart? Yes - validated most recent copy scanned in chart (See row information)     Chief Complaint  Patient presents with   Medical Management of Chronic Issues    Medical Management of Chronic Issues.     HPI:  Pt is a 89 y.o. female seen today for medical management of chronic disease. Pt with hx of tetraparesis, bilateral DVT, htn with orthostatic hypotension, Le edema, constipation.  She had severe orthostatic hypotension and was requiring midodrine  but bp have improved and now not needing. BP are maintaining and not having dizziness or lightheadedness as before.  She has been seen by cardiology for this.  Constipation as been an ongoing issue but better managed now that she is on miralax  daily  Continues to have LE edema and weight gain- she has PRN lasix for this which improves edema. No shortness of breath or chest pains noted.  Mood has been controlled on cymbalta - her husband is under hospice care at this time.  Continues on xarelto  due  to hx of DVT   Past Medical History:  Diagnosis Date   Acute blood loss anemia    Arthritis    Cancer (HCC)    skin   Central cord syndrome at C6 level of cervical spinal cord (HCC) 11/29/2017   Hypertension    Protein-calorie malnutrition, severe 01/24/2018   S/P insertion of IVC (inferior vena caval) filter 01/24/2018   Tetraparesis (HCC)    Past Surgical History:  Procedure Laterality Date   ANTERIOR CERVICAL DECOMP/DISCECTOMY FUSION N/A 11/29/2017   Procedure: Cervical five-six, six-seven Anterior Cervical Decompression Fusion;  Surgeon: Cheryle Debby LABOR, MD;  Location: [facility] OR;  Service: Neurosurgery;  Laterality: N/A;  Cervical five-six, six-seven Anterior Cervical Decompression Fusion   CATARACT EXTRACTION     FEMUR IM NAIL Left 08/16/2021   Procedure: INTRAMEDULLARY (IM) NAIL FEMORAL;  Surgeon: Cleotilde Barrio, MD;  Location: [facility] ORS;  Service: Orthopedics;  Laterality: Left;   IR IVC FILTER PLMT / S&I /IMG GUID/MOD SED  12/08/2017    Allergies  Allergen Reactions   Other Swelling   Lisinopril-Hydrochlorothiazide Swelling    Swelling of the tongue   Bovine (Beef) Protein-Containing Drug Products Swelling    Facial and lip swelling (due to tick bite)   Dairy Aid [Tilactase] Swelling   Lisinopril-Hydrochlorothiazide Swelling    Tongue swelling   Porcine (Pork) Protein-Containing Drug Products Swelling    Outpatient Encounter Medications as of 11/17/2023  Medication Sig   acetaminophen  (TYLENOL ) 500 MG tablet Take 1,000 mg by mouth 3 (three) times daily.   acetic acid 0.25 %  irrigation Use 30ml via irrigation twice daily to keep foley Cath patent.   alendronate (FOSAMAX) 70 MG tablet Take 70 mg by mouth every Saturday.   calcium  carbonate (CALCIUM  600) 600 MG TABS tablet Take 600 mg by mouth at bedtime.   Capsaicin  0.1 % CREA Apply 1 application  topically every evening. (Apply to hands)   cetirizine (ZYRTEC) 5 MG tablet Take 5 mg by mouth daily.   cholecalciferol   (VITAMIN D3) 25 MCG (1000 UNIT) tablet Take 1,000 Units by mouth daily.   DULoxetine  (CYMBALTA ) 60 MG capsule Take 60 mg by mouth daily.   ferrous sulfate  325 (65 FE) MG tablet Take 325 mg by mouth daily with breakfast.   fluocinonide (LIDEX) 0.05 % external solution Apply 1 Application topically every 12 (twelve) hours as needed.   furosemide (LASIX) 20 MG tablet Take 20 mg by mouth daily.   gabapentin  (NEURONTIN ) 100 MG capsule Take 1 capsule (100 mg total) by mouth at bedtime.   hydroxypropyl methylcellulose / hypromellose (ISOPTO TEARS / GONIOVISC) 2.5 % ophthalmic solution Place 1 drop into both eyes every hour as needed for dry eyes.   ketoconazole (NIZORAL) 2 % shampoo Apply 1 Application topically as directed. Every shift on Thursday for seborrheic dermatitis flare up   lactulose , encephalopathy, (CHRONULAC ) 10 GM/15ML SOLN Take 30 mLs by mouth daily as needed (mild constipation).   magnesium  citrate solution Take 60-120 mLs by mouth daily as needed (constipation).   Menthol , Topical Analgesic, (BIOFREEZE COOL THE PAIN) 4 % GEL Apply 1 application  topically every 8 (eight) hours as needed (for left knee pain).   methylcellulose oral powder Take 1 packet by mouth at bedtime.   midodrine  (PROAMATINE ) 5 MG tablet Take 5 mg by mouth every 8 (eight) hours as needed (for SBP < 90).   Multiple Vitamin (MULTIVITAMIN WITH MINERALS) TABS tablet Take 1 tablet by mouth daily.   olopatadine (PATADAY) 0.1 % ophthalmic solution Place 1 drop into both eyes 2 (two) times daily.   ondansetron  (ZOFRAN ) 4 MG tablet Take 4 mg by mouth every 6 (six) hours as needed for nausea or vomiting.   polyethylene glycol (MIRALAX  / GLYCOLAX ) 17 g packet Take 17 g by mouth daily as needed. And daily scheduled in the evening   rivaroxaban  (XARELTO ) 10 MG TABS tablet Take 10 mg by mouth daily.   saccharomyces boulardii (FLORASTOR) 250 MG capsule Take 250 mg by mouth daily.   Saline Spray 0.2 % SOLN Place 2 sprays into both  nostrils 3 (three) times daily as needed (nasal congestion).   simethicone  (MYLICON) 80 MG chewable tablet Chew 80 mg by mouth every 6 (six) hours as needed for flatulence.   sodium phosphate  (FLEET) 7-19 GM/118ML ENEM Place 1 enema rectally every other day as needed for severe constipation.   tiZANidine  (ZANAFLEX ) 2 MG tablet Take 1 tablet (2 mg total) by mouth 2 (two) times daily.   triamcinolone  cream (KENALOG ) 0.1 % Apply to thigh and lower legs topically every 8 hours as needed. Apply to back of neck topically as needed.   No facility-administered encounter medications on file as of 11/17/2023.    Review of Systems  Constitutional:  Negative for activity change, appetite change, fatigue and unexpected weight change.  HENT:  Negative for congestion and hearing loss.   Eyes: Negative.   Respiratory:  Negative for cough and shortness of breath.   Cardiovascular:  Positive for leg swelling. Negative for chest pain and palpitations.  Gastrointestinal:  Negative for abdominal  pain, constipation and diarrhea.  Genitourinary:  Negative for difficulty urinating and dysuria.  Musculoskeletal:  Negative for arthralgias and myalgias.  Skin:  Negative for color change and wound.  Neurological:  Positive for weakness. Negative for dizziness.  Psychiatric/Behavioral:  Negative for agitation, behavioral problems and confusion.      Immunization History  Administered Date(s) Administered   INFLUENZA, HIGH DOSE SEASONAL PF 11/17/2021   Influenza-Unspecified 11/14/2018, 11/20/2019, 11/17/2020, 11/17/2021, 11/24/2022, 11/15/2023   Moderna Covid-19 Fall Seasonal Vaccine 30yrs & older 05/11/2022, 05/13/2023   Moderna Covid-19 Vaccine Bivalent Booster 45yrs & up 06/30/2021, 12/11/2021, 10/29/2022   Moderna Sars-Covid-2 Vaccination 02/09/2019, 03/09/2019, 12/14/2019, 06/20/2020, 10/24/2020   PNEUMOCOCCAL CONJUGATE-20 10/05/2022   PPD Test 05/16/2019   Tdap 11/29/2017   Zoster Recombinant(Shingrix)  09/02/2022, 12/04/2022   Pertinent  Health Maintenance Due  Topic Date Due   Influenza Vaccine  Completed   Mammogram  Discontinued   DEXA SCAN  Discontinued      08/19/2021    8:54 PM 08/20/2021    8:20 AM 03/29/2022   10:11 AM 08/31/2022    2:42 PM 09/20/2023    9:15 AM  Fall Risk  Falls in the past year?   0 0 0  Was there an injury with Fall?   0    Fall Risk Category Calculator   0    (RETIRED) Patient Fall Risk Level High fall risk  High fall risk      Patient at Risk for Falls Due to   No Fall Risks       Data saved with a previous flowsheet row definition   Functional Status Survey:    Vitals:   11/17/23 1155  BP: (!) 108/57  Pulse: 73  Resp: 18  Temp: (!) 97.2 F (36.2 C)  SpO2: 94%  Weight: 195 lb 12.8 oz (88.8 kg)  Height: 5' 7 (1.702 m)   Body mass index is 30.67 kg/m. Physical Exam Constitutional:      General: She is not in acute distress.    Appearance: She is well-developed. She is not diaphoretic.  HENT:     Head: Normocephalic and atraumatic.     Mouth/Throat:     Pharynx: No oropharyngeal exudate.  Eyes:     Conjunctiva/sclera: Conjunctivae normal.     Pupils: Pupils are equal, round, and reactive to light.  Cardiovascular:     Rate and Rhythm: Normal rate and regular rhythm.     Heart sounds: Normal heart sounds.  Pulmonary:     Effort: Pulmonary effort is normal.     Breath sounds: Normal breath sounds.  Abdominal:     General: Bowel sounds are normal.     Palpations: Abdomen is soft.  Musculoskeletal:     Cervical back: Normal range of motion and neck supple.     Right lower leg: No edema.     Left lower leg: No edema.  Skin:    General: Skin is warm and dry.  Neurological:     Mental Status: She is alert and oriented to person, place, and time.     Motor: Weakness present.     Gait: Gait abnormal.  Psychiatric:        Mood and Affect: Mood normal.     Labs reviewed: Recent Labs    09/14/23 1400 09/19/23 0000  09/29/23 0000 10/06/23 0000  NA 137 140 141 141  K 3.6 4.2 4.5 4.2  CL 99 100 103 101  CO2 27 34* 34* 32*  GLUCOSE 143*  --   --   --  BUN 13 10 9 10   CREATININE 0.44 0.5 0.5 0.5  CALCIUM  8.7* 8.8 9.2 9.2   Recent Labs    05/09/23 0000 08/29/23 0000 09/14/23 1400  AST 21 17 26   ALT 16 12 18   ALKPHOS 59 53 53  BILITOT  --   --  1.0  PROT  --   --  6.6  ALBUMIN  3.6 3.6 3.6   Recent Labs    08/10/23 0000 08/29/23 0000 09/14/23 1400 10/06/23 0000  WBC 6.3 4.9 7.5 5.5  NEUTROABS 4,038.00 2,528.00  --  3,427.00  HGB 11.8* 12.2 12.6 13.0  HCT 36 37 38.0 41  MCV  --   --  94.1  --   PLT 280 305 285 330   Lab Results  Component Value Date   TSH 1.15 08/29/2023   No results found for: HGBA1C No results found for: CHOL, HDL, LDLCALC, LDLDIRECT, TRIG, CHOLHDL  Significant Diagnostic Results in last 30 days:  No results found.  Assessment/Plan Tetraparesis (HCC) Partial post cervical spine fracture Continues supportive care.   Orthostatic hypotension Has improved, now bp on higher side and staff holding midodrine .  Will continue midodrine  5 mg TID PRN  Neuropathic pain Stable on gabapentin  at bedtime   Neurogenic bladder Continues on foley, reports functioning well at this time. Continues routine flushes  Iron deficiency anemia Continue on iron supplement   Hypertensive heart disease with other congestive heart failure (HCC) Stable at this time, not currently on medication due to recent hypotension. Will monitor  History of DVT (deep vein thrombosis) Stable without signs of recurrence, continues on xarelto   Constipation due to slow transit Improved on miralax  17 gm daily. Continues routinely.   Age-related osteoporosis without current pathological fracture On alendronate for osteoporosis since hip fracture three years ago. - Continue alendronate as prescribed.  Reactive depression Mood has been stable, continues on cymbalt.       Zonnie Landen K. Caro BODILY [facility] [phone]

## 2023-11-21 ENCOUNTER — Ambulatory Visit

## 2023-11-21 ENCOUNTER — Ambulatory Visit: Admitting: Occupational Therapy

## 2023-11-22 DIAGNOSIS — Z86718 Personal history of other venous thrombosis and embolism: Secondary | ICD-10-CM | POA: Insufficient documentation

## 2023-11-22 DIAGNOSIS — D509 Iron deficiency anemia, unspecified: Secondary | ICD-10-CM | POA: Insufficient documentation

## 2023-11-22 NOTE — Assessment & Plan Note (Signed)
 Continues on foley, reports functioning well at this time. Continues routine flushes

## 2023-11-22 NOTE — Assessment & Plan Note (Signed)
 Continue on iron supplement

## 2023-11-22 NOTE — Assessment & Plan Note (Signed)
 Mood has been stable, continues on cymbalt.

## 2023-11-22 NOTE — Assessment & Plan Note (Signed)
 Stable without signs of recurrence, continues on xarelto 

## 2023-11-22 NOTE — Assessment & Plan Note (Signed)
 Stable at this time, not currently on medication due to recent hypotension. Will monitor

## 2023-11-22 NOTE — Assessment & Plan Note (Signed)
 Partial post cervical spine fracture Continues supportive care.

## 2023-11-22 NOTE — Assessment & Plan Note (Signed)
 Stable on gabapentin  at bedtime

## 2023-11-22 NOTE — Assessment & Plan Note (Signed)
 Improved on miralax  17 gm daily. Continues routinely.

## 2023-11-22 NOTE — Assessment & Plan Note (Signed)
 On alendronate for osteoporosis since hip fracture three years ago. - Continue alendronate as prescribed.

## 2023-11-22 NOTE — Assessment & Plan Note (Signed)
 Has improved, now bp on higher side and staff holding midodrine .  Will continue midodrine  5 mg TID PRN

## 2023-11-23 ENCOUNTER — Ambulatory Visit: Admitting: Occupational Therapy

## 2023-11-23 ENCOUNTER — Ambulatory Visit

## 2023-11-28 ENCOUNTER — Ambulatory Visit: Admitting: Occupational Therapy

## 2023-11-28 ENCOUNTER — Ambulatory Visit

## 2023-11-28 DIAGNOSIS — S72001A Fracture of unspecified part of neck of right femur, initial encounter for closed fracture: Secondary | ICD-10-CM

## 2023-11-28 DIAGNOSIS — R2681 Unsteadiness on feet: Secondary | ICD-10-CM

## 2023-11-28 DIAGNOSIS — R262 Difficulty in walking, not elsewhere classified: Secondary | ICD-10-CM

## 2023-11-28 DIAGNOSIS — R296 Repeated falls: Secondary | ICD-10-CM

## 2023-11-28 DIAGNOSIS — M6281 Muscle weakness (generalized): Secondary | ICD-10-CM

## 2023-11-28 DIAGNOSIS — R278 Other lack of coordination: Secondary | ICD-10-CM

## 2023-11-28 DIAGNOSIS — R2689 Other abnormalities of gait and mobility: Secondary | ICD-10-CM

## 2023-11-28 DIAGNOSIS — S14129S Central cord syndrome at unspecified level of cervical spinal cord, sequela: Secondary | ICD-10-CM

## 2023-11-28 NOTE — Therapy (Signed)
 Occupational Therapy Treatment Note  Patient Name: Sherry [name] MRN: [id number] DOB:16-May-1934, 89 y.o., female Today's Date: 11/29/2022  PCP:  [facility] PROVIDER: Abdul Fine, MD  END OF SESSION:   OT End of Session - 11/28/23 1624     Visit Number 139    Number of Visits 192    Date for Recertification  01/25/24    Authorization Time Period Progress report period starting 04/04/23    OT Start Time 1104    OT Stop Time 1145    OT Time Calculation (min) 41 min    Equipment Utilized During Treatment powered wheelchair    Activity Tolerance Patient tolerated treatment well    Behavior During Therapy [facility] for tasks assessed/performed            Past Medical History:  Diagnosis Date   Acute blood loss anemia    Arthritis    Cancer (HCC)    skin   Central cord syndrome at C6 level of cervical spinal cord (HCC) 11/29/2017   Hypertension    Protein-calorie malnutrition, severe (HCC) 01/24/2018   S/P insertion of IVC (inferior vena caval) filter 01/24/2018   Tetraparesis (HCC)    Past Surgical History:  Procedure Laterality Date   ANTERIOR CERVICAL DECOMP/DISCECTOMY FUSION N/A 11/29/2017   Procedure: Cervical five-six, six-seven Anterior Cervical Decompression Fusion;  Surgeon: Cheryle Debby DELENA, MD;  Location: [facility] OR;  Service: Neurosurgery;  Laterality: N/A;  Cervical five-six, six-seven Anterior Cervical Decompression Fusion   CATARACT EXTRACTION     FEMUR IM NAIL Left 08/16/2021   Procedure: INTRAMEDULLARY (IM) NAIL FEMORAL;  Surgeon: Cleotilde Barrio, MD;  Location: [facility] ORS;  Service: Orthopedics;  Laterality: Left;   IR IVC FILTER PLMT / S&I /IMG GUID/MOD SED  12/08/2017   Patient Active Problem List   Diagnosis Date Noted   Age-related osteoporosis without current pathological fracture 07/27/2022   Mild recurrent major depression (HCC) 07/27/2022   Hypertensive heart disease with other congestive heart failure (HCC) 02/14/2022   Chronic indwelling Foley  catheter 02/14/2022   Closed hip fracture (HCC) 08/15/2021   DVT (deep venous thrombosis) (HCC) 08/15/2021   Quadriplegia (HCC) 08/15/2021   Fall 08/15/2021   Constipation due to slow transit 08/31/2018   Trauma 06/05/2018   Neuropathic pain 06/05/2018   Neurogenic bowel 06/05/2018   Vaginal yeast infection 01/30/2018   Healthcare-associated pneumonia 01/25/2018   Chronic allergic rhinitis 01/24/2018   Depression with anxiety 01/24/2018   UTI due to Klebsiella species 01/24/2018   Protein-calorie malnutrition, severe (HCC) 01/24/2018   S/P insertion of IVC (inferior vena caval) filter 01/24/2018   Tetraparesis (HCC) 01/20/2018   Neurogenic bladder 01/20/2018   Reactive depression    Benign essential HTN    Acute postoperative anemia due to expected blood loss    Central cord syndrome at C6 level of cervical spinal cord (HCC) 11/29/2017   Allergy to alpha-gal 11/25/2016   SCC (squamous cell carcinoma) 04/08/2014   ONSET DATE: 11/29/2017  REFERRING DIAG: Central Cord Syndrome at C6 of the Cervical Spinal Cord, Fall with Bilateral Closed Hip Fractures, with ORIF repair with Intramedullary Nailing of the Left Hip Fracture.   THERAPY DIAG:  Muscle weakness (generalized)  Other lack of coordination  Rationale for Evaluation and Treatment: Rehabilitation  SUBJECTIVE:  SUBJECTIVE STATEMENT:  Pt. has returned to therapy after being with her husband who had transitioned to Hospice  Pt accompanied by: self, Personal Care aide  PERTINENT HISTORY: Central Cord Syndrome at C6 of the Cervical Spinal Cord, Fall  with Bilateral Closed Hip Fractures, with ORIF repair with Intramedullary Nailing of the Left Hip Fracture.   PRECAUTIONS: None  WEIGHT BEARING RESTRICTIONS: No  PAIN:  Are you having pain? No pain   LIVING ENVIRONMENT: Lives with: [facility] Stairs: No Has following equipment at home: Wheelchair (power)  PLOF: Independent   PATIENT GOALS:  See  below for established goals  OBJECTIVE:  Note: Objective measures were completed at Evaluation unless otherwise noted.  HAND DOMINANCE: Right  ADLs: Caregiver assist with ADLs, and [facility]  MOBILITY STATUS: Uses a power w/c  ACTIVITY TOLERANCE: Activity tolerance: Fair  FUNCTIONAL OUTCOME MEASURES:  Measurements:   12/09/2021:   Shoulder flexion: R: 120(136), L: 138(150) Shoulder abduction: R: 108(120), L: 110(120) Elbow: R: 0-148, L: 0-146 Wrist flexion: R: 55(62), L: 78 Writ extension: R: 34(55), L: 3(4) RD: R: 12(20), L: 16(26) UD: R: 8(24), L: 6(18) Thumb radial abduction: R: 11(20), L: 8(20) Digit flexion to the [facility]: R:  2nd: 5cm(3.5cm), 3rd: 7.5 cm(5cm), 4th: 7cm(3cm), 5th: 6cm(5.5cm) L: 2nd: 8cm(8cm), 3rd: 8cm(8cm), 4th: 7.5cm(7cm), 5th: 7cm(7cm)   02/03/2022:   Shoulder flexion: R: 122(138), L: 148(154) Shoulder abduction: R: 109(125), L: 125(125 Elbow: R: 0-148, L: 0-146 Wrist flexion: R: 60(68), L: 79 Writ extension: R: 40(55), L: 3(10) RD: R: 14(24), L: 20(28) UD: R: 8(24), L: 6(18) Thumb radial abduction: R: 20(25), L: 8(20) Digit flexion to the [facility]: R:  2nd: 4.5cm(3cm), 3rd: 6.5cm(4.5cm), 4th: 6.5 cm(3cm), 5th: 4.5cm(5cm) L: 2nd: 8cm(8cm), 3rd: 8cm(7cm), 4th: 7.5cm(7cm), 5th: 6.5cm(6.5cm)   04/07/2022:   Shoulder flexion: R: 125(150), L: 160(160) Shoulder abduction: R: 109(125), L: 125(125 Elbow: R: 0-148, L: 0-146 Wrist flexion: R: 60(68), L: 79 Writ extension: R: 40(55), L: 5(18) RD: R: 14(24), L: 20(28) UD: R: 18(24), L: 8(18) Thumb radial abduction: R: 20(25), L: 8(20) Digit flexion to the [facility]: R:  2nd: 4.5 cm(4 cm), 3rd: 6.5cm(3cm), 4th: 7 cm (5cm), 5th: 5cm(5cm) L: 2nd: 8cm(8cm), 3rd: 8cm(7cm), 4th: 7.5cm(7cm), 5th: 7cm(7cm)   07/05/2022:   Shoulder flexion: R: 125(154), L: 160(160) Shoulder abduction: R: 95(130), L: 143(143) Elbow: R: 0-148, L: 0-146 Wrist flexion: R: 60(68), L: 79 Writ extension: R: 40(60), L: 0(12) RD: R:  10(24), L: 12(20) UD: R: 16(24), L: 8(16) Thumb radial abduction: R: 20(25), L: 8(20) Digit flexion to the [facility]: R:  2nd: 4 cm(4cm), 3rd: 6.5cm( 3.5cm), 4th: 7 cm (5cm), 5th: 6cm(5cm) L: 2nd: 8cm(8cm), 3rd: 8cm(7cm), 4th: 8cm(7cm), 5th: 7cm(7cm)   09/13/2022:   Shoulder flexion: R: 125(154), L: 160(160) Shoulder abduction: R: 100(130), L: 143(143) Elbow: R: 0-150, L: 0-150 Wrist flexion: R: 55(68), L: 79 Writ extension: R: 45(60), L: 0(12) RD: R: 10(24), L: 12(20) UD: R: 16(24), L: 8(16) Thumb radial abduction: R: 20(25), L: 8(20) Digit flexion to the [facility]: R:  2nd: 4 cm(4cm), 3rd: 6cm( 5cm), 4th: 6 cm (4cm), 5th: 5cm(5cm) L: 2nd: 7cm(cm), 3rd: 7cm(6cm), 4th: 7cm(6cm), 5th: 6cm(6cm)   10/27/2022   Shoulder flexion: R: 128(154), L: 160(160) Shoulder abduction: R: 105(130), L: 143(143) Elbow: R: 0-150, L: 0-150 Wrist flexion: R: 55(68), L: 79 Writ extension: R: 46(64), L: 0(20) RD: R: 10(20), L: 18(24) UD: R: 18(28), L: 8(20) Thumb radial abduction: R: 30(42), L:20(24) Digit flexion to the [facility]: R:  2nd: 4 cm(3cm), 3rd: 6cm( 6cm), 4th: 6 cm (5cm), 5th: 5cm(5cm) L: 2nd: 8cm(8cm), 3rd: 8cm(6cm), 4th: 8cm(7cm), 5th: 6cm(6cm)  01/19/2023  Shoulder flexion: R: 131(154), L: 160(160) Shoulder abduction: R: 110(130), L: 143(143) Elbow: R: 0-150,  L: 0-150 Wrist flexion: R: 55(68), L: 79 Writ extension: R: 46(64), L: 0(20) RD: R: 10(20), L: 18(24) UD: R: 18(28), L: 14(20) Thumb radial abduction: R: 38(48), L:24(26) Thumb IP flexion: R: 35(50) L: 20(45) Digit flexion to the [facility]: R:  2nd: 3.5 cm(3cm), 3rd: 6cm( 6cm), 4th: 6 cm (5cm), 5th: 5cm(4.5cm) L: 2nd: 8cm(7.5cm), 3rd: 8cm(6cm), 4th: 7.5cm(6cm), 5th: 6cm(4.5cm)  04/04/2023  Shoulder flexion: R: 134(154), L: 160(160) Shoulder abduction: R: 114(130), L: 143(143) Elbow: R: WNL, L: WNL Wrist flexion: R: 55(68), L: 80 Writ extension: R: 46(64), L: -10(10) RD: R: 14(24), L: 20(24) UD: R: 20(30), L: 14(20) Thumb radial abduction:  R: 38(48), L:28(30) Thumb IP flexion: R: 40(50) L: 25(45) Digit flexion to the [facility]: R:  2nd: 3.5 cm(3cm), 3rd: 6cm( 5cm), 4th: 6 cm (4cm), 5th: 5cm(5cm) L: 2nd: 8cm(7.5cm), 3rd: 8cm(7cm), 4th: 7.5cm(7cm), 5th: 7cm(6cm)  05/25/2023  Shoulder flexion: R: 134(154), L: 160(160) Shoulder abduction: R: 117(134), L: 143(143) Elbow: R: WNL, L: WNL Wrist flexion: R: 55(68), L: 80 Wrist extension: R: 46(64), L: -8(14) RD: R: 14(24), L: 20(24) UD: R: 20(32), L: 14(22) Thumb radial abduction: R: 38(50), L:28(30) Thumb IP flexion: R: 40(50) L: 30(45) Digit flexion to the [facility]: R:  2nd: 3.5 cm(3cm), 3rd: 6cm( 5cm), 4th: 7 cm (4cm), 5th: 5cm(4cm) L: 2nd: 8cm(7.5cm), 3rd: 8cm(7cm), 4th: 7.5cm(7cm), 5th: 7cm(6cm)  07/13/23  Shoulder flexion: R: 134(154), L: 160(160) Shoulder abduction: R: 122(134), L: 143(143) Elbow: R: WNL, L: WNL Wrist flexion: R: 55(68), L: 80 Wrist extension: R: 50(64), L: -4(18) RD: R: 18(24), L: 24(24) UD: R: 20(32), L: 14(22) Thumb radial abduction: R: 38(50), L:32(30) Thumb IP flexion: R: 42(50) L: 30(45) Digit flexion to the [facility]: R:  2nd: 3.5 cm(3cm), 3rd: 6cm( 5cm), 4th: 7 cm (4cm), 5th: 5cm(4cm) L: 2nd: 8cm(7.5cm), 3rd: 8cm(7cm), 4th: 7.5cm(7cm), 5th: 7cm(6cm)  11/02/23:   Shoulder flexion: R: 136(154), L: 160(160) Shoulder abduction: R: 123(134), L: 143(143) Elbow: R: WNL, L: WNL Wrist flexion: R: 55(68), L: 80 Wrist extension: R: 30(38), L: -16(18) RD: R: 8(18), L: 18(24) UD: R: 12(24), L: 8(22) Thumb radial abduction: R: 28(50), L:28(30) Thumb IP flexion: R: 42(55) L: 10(25) Digit flexion to the [facility]: R:  2nd: 4.5cm(4cm), 3rd: 6cm( 4.5cm), 4th: 6.5cm (5cm), 5th: 5cm(4.5cm) L: 2nd: 8cm(7.5cm), 3rd: 7.5cm(7cm), 4th: 7cm(7cm), 5th: 6cm(6cm)    COORDINATION:   01/19/2023:  9 hole peg test:  Right: 2 pegs placed in 1 min. & 5 sec.  Pt. was able to remove 9 pegs in 17 sec.  Left: 2 pegs placed in 1 min. & 10 sec. Pt. was able to remove 9 pegs in 20  sec.  04/04/23:  Pt. was able to consistently grasp the pegs however each one slipped out of her fingers when attempting to place them into the Pegboard.  05/25/2023:   Right: 9 pegs placed and removed in 2 min. & 41 sec. Left: Unable to grasp the pegs for a horizontal position.  07/13/23    Right: 9 pegs placed and removed in 2 min. & 11 sec.     11/02/23:                Right 9 Hole Peg Test: 2 min. & 51 sec.   SENSATION: Intact  EDEMA: N/A   COGNITION: Overall cognitive status: WNL   VISION: Subjective report: Wears glasses, No changes in vision.  PERCEPTION: Intact  TODAY'S TREATMENT:  DATE:  11/28/23  There. Ex.:   -Pt. tolerated PROM bilateral wrist extension with increased focus on the left wrist extension. PROM for bilateral digit MP, PIP, and DIP flexion, and extension, thumb abduction was performed per Pt. request. -Performed BUE strengthening using a 3# dowel ex. 2/2 to weakness. Bilateral shoulder flexion, chest press, circular patterns, and elbow flexion/extension for 2 sets 10 reps each. -Pt. worked on bilateral UE reaching through multiple targets, and planes      PATIENT EDUCATION: Education details: Bilateral hand  ROM, condition management Person educated: Patient Education method: Explanation, Demonstration, and Verbal cues Education comprehension: verbalized understanding and returned demonstration  HOME EXERCISE PROGRAM: Continue to assess HEP needs, and provide as needed.   GOALS: Goals reviewed with patient? Yes  LONG TERM GOALS: Target date: 01/25/2024  3.  Patient will independently use a reacher to pick up items from various surfaces.  Baseline: 11/02/23: Pt. is now able to consistently pick up small objects from the floor with a reacher. 07/13/2023: Pt. is starting to use a reacher to retrieve items with an  adaptation/modification to the trigger. 05/25/2023:  Pt. is now able to initiate grasping a reacher to open and close the reacher with the right hand, however requires the grasp handle to be repositioned  within the hand. Pt. requires work on using/moving the reacher with the right hand through various planes. 04/04/2023: Pt. continues to work on improving Digit flexion to the [facility] to be able to efficiently handle Adaptive equipment. 01/19/2023: Pt. Is improving with Bilateral digit flexion to the [facility]. Pt. has difficulty firmly holding adaptive devices. 12/06/2022: Pt. continues to present with increased MP, PIP, and DIP digit tightness/stiffness limiting the formulation of bilateral composites fists. 10/27/2022: Pt. presents with increased MP, PIP, and DIP digit tightness/stiffness limiting the formulation of bilateral composites fists during this progress reporting period limiting. Digit flexion to the [facility]: R:  2nd: 4 cm(3cm), 3rd: 6cm( 6cm), 4th: 6 cm (5cm), 5th: 5cm(5cm), L: 2nd: 8cm(8cm), 3rd: 8cm(6cm), 4th: 8cm(7cm), 5th: 6cm(6cm) 09/20/2022: Improving digit flexion to the [facility]. 09/13/22: improved digit flexion to the [facility]. 07/05/2022: Pt. presents with digit MP, PIP, and DIP extensor tightness limiting her ability to securely grip objects in her bilateral hands.  04/07/2022: Pt. Has improved with right 2nd, and 3rd digit flexion towards the [facility]. Pt. Continues to have difficulty securely holding and applying deodorant.02/03/2022: Pt. has improved with digit flexion, however, continues to have difficulty securely holding and using deodorant. 12/09/2021: Bilateral hand/digit MP, PIP, and DIP extension tightness limits her ability achieve digit flexion to hold, and apply deodorant. 10/28/2021:  Pt. continues to have difficulty holding the deodorant. 01/12/2021: Pt. presents with limited digit extension. Pt. Is able to initiate holding deodorant, however is unable to hold it while using it  Goal status:  Achieved   4.  Pt.  Will improve bilateral wrist extension in preparation for anticipating, and initiating reaching for objects at the table.  Baseline: 11/02/23: Wrist extension: R: 30(38), L: -16(18) 07/13/23: Wrist extension: R: 50(64), L: -4(18) 05/25/23: Wrist extension: R: 46(64), L: -8(14) 04/04/23: R: 46(64) L: -10(10)01/19/2023:  R: 46(64) L: 0(20) 12/06/2022: Pt continues to to progress with bilateral wrist extension in preparation fro functional reaching. 10/27/2022: Pt. Is improving with bilateral wrist extension. R: 46(64) L: 0(20) 09/20/2022: limited PROM in the Left wrist extension 2/2 tightness/stiffness. 09/13/22: R: 55(68), L: 0(12) 07/19/2022: Bilateral wrist extension in limited. 07/05/2022: Right: 40(60) Left: 0(12) 04/07/2022: Right: 40(55) Left: 5(18) 02/03/2022: Right: 34(55)  Left: 3(4) 12/09/2021: Right  34(55)  Left  3(4) 10/28/2021: Right 22(38), Left 0(15) 09/14/2021:  Right: 17(35), left 2(15)  Goal status: Ongoing  7.  Pt. Will increase bilateral lateral pinch strength by 2 lbs to be able to securely grasp items during ADLs, and IADL tasks.  Baseline: 01/19/2023: Deferred as the Pinch meter is out for calibration.12/06/2022: Pt. Is able to more securely hold objects during ADLs/IADLs 10/27/2022: Pt. Is improving with holding items with her bilateral thumbs. (Pinch meter out for calibration) 09/20/2022: lateral pinch continues to be limited 09/13/22: R 6.5# L 4# 07/19/2022: TBD 07/05/2022: TBD 07/2022: NT-Pinch meter out for calibration. 02/03/2022: Right: 6#, Left: 4# 12/09/2021: Right: 6#, Left: 4#  Goal status: Deferred   9.  Pt. will complete plant care with modified independence.  Baseline:12/06/2022: Pt. Is independent with plant care in her husband's room, however is not able to access her plants due to furniture blocking access to them. 10/27/2022: Pt. Is able to water plants that are closer, and within reach. Pt. continues to have difficulty reaching for thorough plant care. 09/20/2022: Pt. Continues to have  difficulty reaching for thorough plant care. 09/13/22: continues to report intermittent difficulty 07/19/2022: Pt. Continues to be able to water, and care for some of her plants. Pt. Has more difficulty with plants that are harder to reach. 07/05/2022: Pt. is able to water, and care for some of her plants. Pt. Has more difficulty with plants that are harder to reach. 04/07/2022: Pt. is now able to hold a cup and water her plants.02/03/2022: Pt. has difficulty caring for her plants.  Goal status:Achieved   10.  Pt. will demonstrate adaptive techniques to assist with the efficiency of self-dressing, or morning care tasks.  Baseline: 07/13/23:Pt. continues to work on improving UE functioning to be able to efficiently use Adaptive equipment 05/25/2023: Pt. continues to work on improving UE functioning to be able to efficiently use Adaptive equipment.  04/04/2023:  Pt. continues to work on improving UE functioning to be able to efficiently use Adaptive equipment. 01/19/2203: Pt. Is able to donn her jacket in reverse independently. Pt. requires assist from staff, as Pt. Reports staff have decreased time. 12/06/2022: Pt. requires assist from staff, as Pt. Reports staff have decreased time. 10/27/2022: Pt. is able to assist with initiating UE dressing. Pt. requires assist from personal, and staff care aides. 09/20/2022: Pt. continues to require assist form personal, and staff care aides. 09/13/22: MODA dressing, reports she is often rushed 07/19/2022: Pt. continues to require assist with self-dressing/morning care tasks. 07/05/2022: Continue with goal. 04/07/2022: Pt. Continues to require assist with the efficiency of self-dressing, and morning care tasks.02/03/2022: Pt. requires assist from caregivers 2/2 time limitations during morning care.  Goal status:  Deferred- Has caregiver assist and morning routine in place.  11.  Pt. will improve BUE strength to improve ADL, and IADL functioning.  Baseline: 11/02/23: flexion L 4-/5, R  4-/5; shoulder abduction L 4-/5, R 4-/5. elbow flexion B 4/5,01/19/2023: L 4/5, R 3+/5  07/13/23: flexion L 4/5, R 4-/5; shoulder abduction L 4+/5, R 4-/5. elbow flexion B 5/5, elbow extension 4+/5 01/19/2023: L 4/5, R 3+/5  05/25/2023: shoulder flexion L 4/5, R 4-/5; shoulder abduction L 4+/5, R 4-/5. elbow flexion B 5/5, elbow extension 4+/5 01/19/2023: L 4/5, R 3+/5  04/04/2023: shoulder flexion L 4/5, R 3+/5; shoulder abduction L 4+/5, R 3+/5. elbow flexion B 5/5, elbow extension 4/5 01/19/2023: L 4/5, R 3+/5 12/06/2022: Continue 10/27/2022: shoulder flexion L 4/5,  R 3+/5; shoulder abduction L 4+/5, R 3+/5. elbow flexion B 5/5, elbow extension 4/5 09/20/2022: Pt. Presents with limited BUE strength 09/13/22: shoulder flexion L 4/5, R 3/5; elbow flexion B 5/5, elbow extension 4-/5; shoulder abduction L 4+/5, R 3+/5. 07/19/2022: BUE strength continues to be limited. 07/05/2022: shoulder flexion: right 4-/5, abduction: 3+/5, elbow flexion: right: 5/5, left 5/5, extension: right: 4-/5, left 4-/5, wrist extension: right: 3-/5, left: 2-/5  Goal status: Ongoing  12.  Pt. will improve bilateral thumb radial abduction in order to be able to hold the grab bars while standing with PT  Baseline: 11/02/23: Thumb radial abduction: R: 28(50), L:28(30) 07/13/23: Pt. Is improving with holding the grab bars with the right hand .05/25/23: Thumb radial abduction: R: 38(50), L:28(30) 04/04/23: R: 38(48), L:28(30) 01/19/2023: R: 38(48) L: 24(26)12/06/2022: Pt. Continues to present  with limited thumb abduction, however is improving holding onto the parallel bars.10/27/2022: thumb radial abduction: R: 30(42), L: 20(24  Goal status: Ongoing  13.  Pt. Will be able to securely hold, and stabilize medication bottles at a tabletop surface when opening, and closing them.  Baseline: 01/19/2023: Pt. Is now able to securely hold medication bottles while opening them, and stabilizes them on surfaces while opening them. 12/06/2022: Pt is improving  with securely stabilizing medication bottles. 10/27/2022: Pt. Stabilizes bottles against her torso when attempting to open, and close them   Goal status: Achieved  14: Pt. Will improve bilateral thumb IP flexion to improve active grasping patterns.    Baseline:11/02/23: Thumb IP flexion: R: 42(55) L: 10(25) 07/13/23: Thumb IP flexion: R: 42(50) L: 30(45) 05/25/2023: Thumb IP flexion: R: 40(50) L: 30(45) 04/04/2023:  R: 40(50) L: 25(45) 01/19/2023: Right 35(50), Left: 20/45)    Goal status: Ongoing    15. Pt. Will improve bilateral FMC/speed, and dexterity. As evidence by improved scores on the 9 hole peg test. Baseline: 11/02/23: 2 min. & 51 sec. 07/13/2023: Right: 9 pegs placed and removed in 2 min. & 11 sec. 05/25/2023: 9 Hole pegs test speed/dexterity: Right: 9 pegs placed and removed in 2 min. & 41 sec. Left: Unable to grasp the pegs for a horizontal position. 04/04/23: Pt. was able to consistently grasp the pegs however each one slipped out of her fingers when attempting to place them into the Pegboard. 01/19/2023: 2 pegs placed in 1 min. & 5 sec.  Pt. was able to remove 9 pegs in 17 sec.; Left: 2 pegs placed in 1 min. & 10 sec. Pt. was able to remove 9 pegs in 20 sec.   Goal status: Ongoing    16. Pt. Will donn/doff a jacket with modified independence   Baseline: 11/02/23: MaxA   Goal status: New     ASSESSMENT:  CLINICAL IMPRESSION:    Upon arrival, Pt. reports that her BP had been elevated this morning. During therapy, Pt.'s BP 117/85 HR 79 bpms. Pt. tolerated all bilateral hand ROM exercises well, and tolerated BUE strengthening exercises as well. Pt. did require rest breaks during the dowel exercises. Pt. continues to benefit from OT services to work on impoving ROM, and UE strength in order to work towards increasing bilateral hand grasp on objects, and increasing engagement of bilateral hands during ADLs, and IADL tasks     PERFORMANCE DE PERFORMANCE DEFICITS: in functional skills  including ADLs, IADLs, coordination, dexterity, ROM, strength, and UE functional use, cognitive skills including , and psychosocial skills including coping strategies and environmental adaptation.   IMPAIRMENTS: are limiting patient from ADLs, IADLs,  and leisure.   CO-MORBIDITIES: may have co-morbidities  that affects occupational performance. Patient will benefit from skilled OT to address above impairments and improve overall function.  MODIFICATION OR ASSISTANCE TO COMPLETE EVALUATION: Min-Moderate modification of tasks or assist with assess necessary to complete an evaluation.  OT OCCUPATIONAL PROFILE AND HISTORY: Detailed assessment: Review of records and additional review of physical, cognitive, psychosocial history related to current functional performance.  CLINICAL DECISION MAKING: Moderate - several treatment options, min-mod task modification necessary  REHAB POTENTIAL: Good for stated goals  PLAN:  OT FREQUENCY 2x's a week  OT DURATION: 12 weeks  PLANNED INTERVENTIONS ADL training, A/E training, UE ther. Ex, Manual therapy, neuromuscular re-education, moist heat modality, Paraffin Bath, Splinting, and  Pt./caregiver education   RECOMMENDED OTHER SERVICES: PT  CONSULTED AND AGREED WITH PLAN OF CARE: Patient  PLAN FOR NEXT SESSION: Continue Treatment as per established POC  Richardson Otter, MS, OTR/L  11/28/23, 4:26 PM

## 2023-11-28 NOTE — Therapy (Signed)
 OUTPATIENT PHYSICAL THERAPY NEURO TREATMENT  Patient Name: Sherry [name] MRN: [id number] DOB:[birth year], 89 y.o., female Today's Date: 11/28/2023   PCP: Richerd Brigham, MD REFERRING PROVIDER: Cleotilde Barrio   PT End of Session - 11/28/23 1556     Visit Number 128    Number of Visits 150    Date for Recertification  12/28/23    Progress Note Due on Visit 130    PT Start Time 1145    PT Stop Time 1227    PT Time Calculation (min) 42 min    Equipment Utilized During Treatment Gait belt    Activity Tolerance Other (comment);Patient tolerated treatment well    Behavior During Therapy [facility] for tasks assessed/performed                            Past Medical History:  Diagnosis Date   Acute blood loss anemia    Arthritis    Cancer (HCC)    skin   Central cord syndrome at C6 level of cervical spinal cord (HCC) 11/29/2017   Hypertension    Protein-calorie malnutrition, severe 01/24/2018   S/P insertion of IVC (inferior vena caval) filter 01/24/2018   Tetraparesis (HCC)    Past Surgical History:  Procedure Laterality Date   ANTERIOR CERVICAL DECOMP/DISCECTOMY FUSION N/A 11/29/2017   Procedure: Cervical five-six, six-seven Anterior Cervical Decompression Fusion;  Surgeon: Cheryle Debby DELENA, MD;  Location: [facility] OR;  Service: Neurosurgery;  Laterality: N/A;  Cervical five-six, six-seven Anterior Cervical Decompression Fusion   CATARACT EXTRACTION     FEMUR IM NAIL Left 08/16/2021   Procedure: INTRAMEDULLARY (IM) NAIL FEMORAL;  Surgeon: Cleotilde Barrio, MD;  Location: [facility] ORS;  Service: Orthopedics;  Laterality: Left;   IR IVC FILTER PLMT / S&I /IMG GUID/MOD SED  12/08/2017   Patient Active Problem List   Diagnosis Date Noted   Iron deficiency anemia 11/22/2023   History of DVT (deep vein thrombosis) 11/22/2023   Chronic pain syndrome 08/25/2023   Orthostatic hypotension 08/25/2023   Age-related osteoporosis without current pathological fracture 07/27/2022    Mild recurrent major depression 07/27/2022   Hypertensive heart disease with other congestive heart failure (HCC) 02/14/2022   Chronic indwelling Foley catheter 02/14/2022   Closed hip fracture (HCC) 08/15/2021   DVT (deep venous thrombosis) (HCC) 08/15/2021   Quadriplegia (HCC) 08/15/2021   Fall 08/15/2021   Constipation due to slow transit 08/31/2018   Neuropathic pain 06/05/2018   Neurogenic bowel 06/05/2018   Healthcare-associated pneumonia 01/25/2018   Chronic allergic rhinitis 01/24/2018   Depression with anxiety 01/24/2018   Protein-calorie malnutrition, severe 01/24/2018   S/P insertion of IVC (inferior vena caval) filter 01/24/2018   Tetraparesis (HCC) 01/20/2018   Neurogenic bladder 01/20/2018   Reactive depression    Benign essential HTN    Acute postoperative anemia due to expected blood loss    Central cord syndrome at C6 level of cervical spinal cord (HCC) 11/29/2017   Allergy to alpha-gal 11/25/2016   SCC (squamous cell carcinoma) 04/08/2014    ONSET DATE: 08/15/2021 (fall with B hip fx); Initial injury was in 2019- quadraplegia due to central cord syndrome of C6 secondary to neck fracture.   REFERRING DIAG: Bilateral Hip fx; Left Open- s/p Left IM nail femoral on 08/16/2021; Right closed  THERAPY DIAG:  Muscle weakness (generalized)  Other lack of coordination  Other abnormalities of gait and mobility  Difficulty in walking, not elsewhere classified  Unsteadiness on feet  Closed fracture  of both hips, initial encounter (HCC)  Repeated falls  Central cord syndrome, sequela  Rationale for Evaluation and Treatment Rehabilitation  SUBJECTIVE:   SUBJECTIVE STATEMENT:   Patient reports She is doing the best she can. Reports she lost her husband last week and just going through the motions. States she has been so preoccupied that she has not been exercising. States no pain today and no recent falls.       Pt accompanied by: Paid caregiver and dtr-    PERTINENT HISTORY: Sherry [name] is an 87yoF who experienced a fall at home on 08/15/2021 with hip fracture, s/p Left hip ORIF. PMH: quadriplegia due to central cord syndrome of C6 secondary to neck fracture, HTN, depression with anxiety, neurogenic bladder, bilateral DVT on Xarelto  (s/p of IVC filter placement). Prior history of significant PT/OT with improvement of function. Pt has full use of shoulder/elbows, limited use of hands, adaptive self feeding sucessful.     PAIN:  None current but does endorse some left thigh pain intermittent  OBJECTIVE:   TODAY'S TREATMENT:   11/28/23     THEREX:   AROM Hip flex 2 x 10   Light manual resistive ABD/ADD 2 x10 -Hold 5 sec Light manual  knee flex - 2 sets of 10 reps ea LE Light manual knee ext- 2 sets of 10 reps ea LE  Manual resistive leg press 2 x 10 reps   AROM ankle DF/PF 2 x 10 reps each LE  PATIENT EDUCATION: Education details: Exercise technique Patient verbalized understanding.   HOME EXERCISE PROGRAM: No changes at this time  Access Code: [facility] URL: [email or web address] Date: 11/23/2021 Prepared by: Kennie Custard Exercises - Seated Gluteal Sets - 2 x daily - 7 x weekly - 2 sets - 10 reps - 5 hold - Seated Quad Set - 2 x daily - 7 x weekly - 2 sets - 10 reps - 5 hold - Seated Long Arc Quad - 2 x daily - 7 x weekly - 2 sets - 10 reps - 5 hold - Seated March - 2 x daily - 7 x weekly - 2 sets - 10 reps - 5 hold - Seated Hip Abduction - 2 x daily - 7 x weekly - 2 sets - 10 reps - 5 hold - Seated Shoulder Shrugs - 2 x daily - 7 x weekly - 2 sets - 10 reps - 5 hold - Wheelchair Pressure Relief - 2 x daily - 7 x weekly - 2 sets - 10 reps - 5 hold     GOALS: Goals reviewed with patient? Yes  SHORT TERM GOALS: Target date: 02/22/2022  Pt will be independent with initial UE strengthening HEP in order to improve strength and balance in order to decrease fall risk and improve function at home and work. Baseline:  10/19/2021- patient with no formal UE HEP; 12/02/2021= Patient verbalized knowledge of HEP including use of theraband for UE strengthening.  Goal status: GOAL MET  LONG TERM GOALS: Target date: 12/28/2023   Pt will be independent with final for UE/LE HEP in order to improve strength and balance in order to decrease fall risk and improve function at home and work. Baseline: Patient is currently BLE NWB and unable to participate in HEP. Has order for UE strengthening. 01/11/2022- Patient now able to participate in LE strengthening although NWB still- good understanding for some basic exercises. Will keep goal active to incorporate progressive LE strengthening exercises. 04/07/2022= Patient still participating in progressive LE seated HEP and  has no questions at this time.  Goal status: MET  2.  Pt will improve FOTO to target score of 35  to display perceived improvements in ability to complete ADL's.  Baseline: 10/19/2021= 12; 12/02/2021= 17; 01/11/2022=17; 04/07/2022= 17; 07/05/2022=17; 10/06/2022- outcome measure not appropriate as patient in non-ambulatory and not consistently standing. Goal status: Goal not appropriate  3.  Pt will increase strength of B UE  by at least 1/2 MMT grade in order to demonstrate improvement in strength and function  Baseline: patient has range of 2-/5 to 4/5 BUE Strength; 12/02/2021= 4/5 except with wrist ext. 01/11/2022= 4/5 B UE strength expept for wrist Goal status: Goal revised-no longer appropriate- working with OT on all UE strengthening.   4. Pt. Will increase strength of RLE by at least 1/2 MMT grade in order to demonstrate improvement in Standing/transfers.  Baseline: 2-/5 Right hip flex/knee ext/flex; 04/07/2022= 2- with right hip flex/knee ext (lacking 28 deg from zero) 4/8= Patient able to ext right knee lacking 18 deg from zero. 07/05/2022=Left knee approx 8 deg from zero and right knee = 23 deg from zero; 12/29/2022 - Left LE = full ROM 4-/5 and right knee ext= 23 deg  from zero= 2+/5 (gravity minimized); 03/28/2023= Left knee ext=  4/5 and right knee ext= 3-/5 (able to move lower leg through incomplete ROM -lacking 20 deg from full ROM against gravity); 05/23/2023= right knee ext= 3-/5 (able to move lower leg through incomplete ROM -lacking 17 deg from full ROM against gravity); 06/20/2023= Right knee ext= lacking 20 deg from zero; 10/05/2023- lacking 24 deg from zero RLE Knee ext  Goal status: Progressing  5. Pt. Will demo ability to stand pivot transfer with max assist for improved functional mobility and less dependent need on mechanical device.   Baseline: Dependent on hoyer lift for all transfers; 04/07/2022- Unable to test secondary to patient with recent UTI and right hand procedure - will attempt to reassess next visit. ; 04/12/22: Unable to successfully stand due to feet plates on loaned power w/c; 05/10/2022- Patient still having to use rental chair and has not received her original power w/c back to practice SPT. 07/04/2021= Patient just received new power w/c and attempted standing from 1st time today- Patient able to stand with max assist today from w/c and did not attempt to perform pivot transfer- difficulty with static standing and placing weight on right LE. 10/06/2022- Not assessed today- patient needs new AFO prior to attempting transfers; 12/29/2022= Patient has not attempted SPT yet - still trying to improve LE strength enough to stand well without knee buckling with weight shifting. 03/28/2023- not attempted today due to ill fitting brace but patient last performed SPT on 01/10/23 and was able to perform with Max A with feet pre-positioned to angle toward direction of transfer; 05/23/2023- Not tested today but on last visit- patient able to perform SPT with max assist yet concern for twisting knee as she was unable to turn/pivot foot .  06/20/2023- deferred to next session due to patient fatigued with static standing today. 10/05/2023-Patient able to SPT with Max assist yet  still unable to move LE to pivot so transfers are performed very slowly.   Goal status: MET  6. Pt. Will demonstrate improved functional LE strength as seen by ability to stand > 2 min for improved transfer ability and pregait abilities.   Baseline: not assessed today due to recent dx: UTI; 04/07/2022- Unable to test secondary to patient with recent UTI and right  hand procedure - will attempt to reassess next visit; 04/12/22: Unable to successfully stand due to feet plates on loaned power w/c 05/10/2022- Patient still having to use rental chair and has not received her original power w/c back to practice Standing. 07/05/2022- Patient able to stand with max assist at trunk today for 2 min 3 sec. Will keep goal active to ensure consistency. 10/06/2022= Unable to assess today- patient with some recent blood pressure issues and needs new AFO. 11/24/2022= Patient demonstrated multiple rounds of standing in // bars- holding up to 1:45 min today but has been able to exhibit 2 min in recent past- working on consistency. 12/29/2022= 2 min 40 sec with patient demonstrating improved overall posture and ability to contract glutes and quads with just min assist at trunk today.   Goal status: MET  7. Pt. Will demonstrate improved functional LE strength as seen by ability to stand > 5 min for improved transfer ability and pregait abilities. Baseline: 12/29/2022= 2 min 40 sec with patient demonstrating improved overall posture and ability to contract glutes and quads with just min assist at trunk today. 03/28/2023= Attempted 2 trials today - at best 2 min 48 sec today but during cert- did stand for 3 min 44 sec. 06/20/2023= Patient able to stand 2x (45 sec and 1 min 12 sec)- max assist yet no slippage of LLE.  07/13/2023= Patient able to stand x 3 trials today- each 1-2 min focusing on posture- patient with improving gluteal activation and less physical assist once in standing position. 10/05/2023= Patient able to stand for 1 min 11 sec  today- Left knee buckled.  Goal Status: Ongoing  8. Patient will perform sit to stand transfer with moderate assist consistently > 75% of time for improved transfer ability and less dependence on caregiver.   Baseline: 12/29/2022- Patient currently max assist level assistance with all sit to stand activities. 03/28/2023= Patient is still majority of max assist to stand yet able to pull up from wheelchair smoother in one motion. 06/20/2023- Max Assist but has only most recently resumed standing since last week so goal still appropriate for new cert; 3/88/7974=dupoo max assist but less overall assist on last trial of standing with improved forward lean and gluteal activation. 10/05/2023= Patient able to transfer with Max A with inability to pivot/move her LE - Increased risk of injury.   Goal status: ONGOING  9. 4. Pt. Will increase strength of RLE as seen by ability to achieve < 15 deg from zero with R knee ext in order to demonstrate improvement in Standing/transfers.  Baseline: 10/05/2023= lacking 24 deg from zero (active R knee ext - measured sitting in power w/c)  Goal status: NEW    ASSESSMENT:  CLINICAL IMPRESSION:   Patient returns after missing last 3 weeks being there for her husband who was transitioned to Hospice care and passed away last week. She presents with some obvious weakness and was challenged with some active and manual resistive LE strengthening activities. She was fatigued with some loss of active movement on RLE. She was motivated and in good spirits today and should be able to return to some standing next visit. Pt will continue to benefit from skilled PT services to optimize independence and reduced caregiver support.   OBJECTIVE IMPAIRMENTS Abnormal gait, decreased activity tolerance, decreased balance, decreased coordination, decreased endurance, decreased mobility, difficulty walking, decreased ROM, decreased strength, hypomobility, impaired flexibility, impaired UE functional  use, postural dysfunction, and pain.   ACTIVITY LIMITATIONS carrying, lifting, bending,  sitting, standing, squatting, sleeping, stairs, transfers, bed mobility, continence, bathing, toileting, dressing, self feeding, reach over head, hygiene/grooming, and caring for others  PARTICIPATION LIMITATIONS: meal prep, cleaning, laundry, medication management, personal finances, interpersonal relationship, driving, shopping, community activity, and yard work  PERSONAL FACTORS Age, Time since onset of injury/illness/exacerbation, and 1-2 comorbidities: HTN, cervical Sx are also affecting patient's functional outcome.   REHAB POTENTIAL: Good  CLINICAL DECISION MAKING: Evolving/moderate complexity  EVALUATION COMPLEXITY: Moderate  PLAN: PT FREQUENCY: 1-2x/week  PT DURATION: 12 weeks  PLANNED INTERVENTIONS: Therapeutic exercises, Therapeutic activity, Neuromuscular re-education, Balance training, Gait training, Patient/Family education, Self Care, Joint mobilization, DME instructions, Dry Needling, Electrical stimulation, Wheelchair mobility training, Spinal mobilization, Cryotherapy, Moist heat, and Manual therapy  PLAN FOR NEXT SESSION:   Standing frame if available Continue with LE and core strength training  Sit to stand and static standing trials to promote improved standing. SPT as able.     3:57 PM, 11/28/23  3:57 PM, 11/28/23 Chyrl London, PT Physical Therapist - [facility]  Outpatient Physical Therapy- Main Campus [phone]

## 2023-11-30 ENCOUNTER — Ambulatory Visit

## 2023-11-30 ENCOUNTER — Ambulatory Visit: Admitting: Occupational Therapy

## 2023-11-30 DIAGNOSIS — M6281 Muscle weakness (generalized): Secondary | ICD-10-CM | POA: Diagnosis not present

## 2023-11-30 NOTE — Therapy (Signed)
 Occupational Therapy Progress Note  Dates of reporting period  07/13/23   to   11/30/23   Patient Name: Sherry [name] MRN: [id number] DOB:[birth year], 89 y.o., female Today's Date: 11/29/2022  PCP:  [facility] PROVIDER: Abdul Fine, MD  END OF SESSION:   OT End of Session - 11/30/23 2133     Visit Number 140    Number of Visits 192    Date for Recertification  01/25/24    OT Start Time 1100    OT Stop Time 1145    OT Time Calculation (min) 45 min    Equipment Utilized During Treatment powered wheelchair    Activity Tolerance Patient tolerated treatment well    Behavior During Therapy WFL for tasks assessed/performed              Past Medical History:  Diagnosis Date   Acute blood loss anemia    Arthritis    Cancer (HCC)    skin   Central cord syndrome at C6 level of cervical spinal cord (HCC) 11/29/2017   Hypertension    Protein-calorie malnutrition, severe (HCC) 01/24/2018   S/P insertion of IVC (inferior vena caval) filter 01/24/2018   Tetraparesis (HCC)    Past Surgical History:  Procedure Laterality Date   ANTERIOR CERVICAL DECOMP/DISCECTOMY FUSION N/A 11/29/2017   Procedure: Cervical five-six, six-seven Anterior Cervical Decompression Fusion;  Surgeon: Cheryle Debby DELENA, MD;  Location: [facility] OR;  Service: Neurosurgery;  Laterality: N/A;  Cervical five-six, six-seven Anterior Cervical Decompression Fusion   CATARACT EXTRACTION     FEMUR IM NAIL Left 08/16/2021   Procedure: INTRAMEDULLARY (IM) NAIL FEMORAL;  Surgeon: Cleotilde Barrio, MD;  Location: [facility] ORS;  Service: Orthopedics;  Laterality: Left;   IR IVC FILTER PLMT / S&I /IMG GUID/MOD SED  12/08/2017   Patient Active Problem List   Diagnosis Date Noted   Age-related osteoporosis without current pathological fracture 07/27/2022   Mild recurrent major depression (HCC) 07/27/2022   Hypertensive heart disease with other congestive heart failure (HCC) 02/14/2022   Chronic indwelling Foley catheter  02/14/2022   Closed hip fracture (HCC) 08/15/2021   DVT (deep venous thrombosis) (HCC) 08/15/2021   Quadriplegia (HCC) 08/15/2021   Fall 08/15/2021   Constipation due to slow transit 08/31/2018   Trauma 06/05/2018   Neuropathic pain 06/05/2018   Neurogenic bowel 06/05/2018   Vaginal yeast infection 01/30/2018   Healthcare-associated pneumonia 01/25/2018   Chronic allergic rhinitis 01/24/2018   Depression with anxiety 01/24/2018   UTI due to Klebsiella species 01/24/2018   Protein-calorie malnutrition, severe (HCC) 01/24/2018   S/P insertion of IVC (inferior vena caval) filter 01/24/2018   Tetraparesis (HCC) 01/20/2018   Neurogenic bladder 01/20/2018   Reactive depression    Benign essential HTN    Acute postoperative anemia due to expected blood loss    Central cord syndrome at C6 level of cervical spinal cord (HCC) 11/29/2017   Allergy to alpha-gal 11/25/2016   SCC (squamous cell carcinoma) 04/08/2014   ONSET DATE: 11/29/2017  REFERRING DIAG: Central Cord Syndrome at C6 of the Cervical Spinal Cord, Fall with Bilateral Closed Hip Fractures, with ORIF repair with Intramedullary Nailing of the Left Hip Fracture.   THERAPY DIAG:  Muscle weakness (generalized)  Other lack of coordination  Rationale for Evaluation and Treatment: Rehabilitation  SUBJECTIVE:  SUBJECTIVE STATEMENT:  Patient reports having had elevated blood pressure this morning.  Pt accompanied by: self, Personal Care aide  PERTINENT HISTORY: Central Cord Syndrome at C6 of the Cervical Spinal Cord, Fall with  Bilateral Closed Hip Fractures, with ORIF repair with Intramedullary Nailing of the Left Hip Fracture.   PRECAUTIONS: None  WEIGHT BEARING RESTRICTIONS: No  PAIN:  Are you having pain? No pain   LIVING ENVIRONMENT: Lives with: [facility] Stairs: No Has following equipment at home: Wheelchair (power)  PLOF: Independent   PATIENT GOALS:  See below for established  goals  OBJECTIVE:  Note: Objective measures were completed at Evaluation unless otherwise noted.  HAND DOMINANCE: Right  ADLs: Caregiver assist with ADLs, and [facility]  MOBILITY STATUS: Uses a power w/c  ACTIVITY TOLERANCE: Activity tolerance: Fair  FUNCTIONAL OUTCOME MEASURES:  Measurements:   12/09/2021:   Shoulder flexion: R: 120(136), L: 138(150) Shoulder abduction: R: 108(120), L: 110(120) Elbow: R: 0-148, L: 0-146 Wrist flexion: R: 55(62), L: 78 Writ extension: R: 34(55), L: 3(4) RD: R: 12(20), L: 16(26) UD: R: 8(24), L: 6(18) Thumb radial abduction: R: 11(20), L: 8(20) Digit flexion to the [facility]: R:  2nd: 5cm(3.5cm), 3rd: 7.5 cm(5cm), 4th: 7cm(3cm), 5th: 6cm(5.5cm) L: 2nd: 8cm(8cm), 3rd: 8cm(8cm), 4th: 7.5cm(7cm), 5th: 7cm(7cm)   02/03/2022:   Shoulder flexion: R: 122(138), L: 148(154) Shoulder abduction: R: 109(125), L: 125(125 Elbow: R: 0-148, L: 0-146 Wrist flexion: R: 60(68), L: 79 Writ extension: R: 40(55), L: 3(10) RD: R: 14(24), L: 20(28) UD: R: 8(24), L: 6(18) Thumb radial abduction: R: 20(25), L: 8(20) Digit flexion to the [facility]: R:  2nd: 4.5cm(3cm), 3rd: 6.5cm(4.5cm), 4th: 6.5 cm(3cm), 5th: 4.5cm(5cm) L: 2nd: 8cm(8cm), 3rd: 8cm(7cm), 4th: 7.5cm(7cm), 5th: 6.5cm(6.5cm)   04/07/2022:   Shoulder flexion: R: 125(150), L: 160(160) Shoulder abduction: R: 109(125), L: 125(125 Elbow: R: 0-148, L: 0-146 Wrist flexion: R: 60(68), L: 79 Writ extension: R: 40(55), L: 5(18) RD: R: 14(24), L: 20(28) UD: R: 18(24), L: 8(18) Thumb radial abduction: R: 20(25), L: 8(20) Digit flexion to the [facility]: R:  2nd: 4.5 cm(4 cm), 3rd: 6.5cm(3cm), 4th: 7 cm (5cm), 5th: 5cm(5cm) L: 2nd: 8cm(8cm), 3rd: 8cm(7cm), 4th: 7.5cm(7cm), 5th: 7cm(7cm)   07/05/2022:   Shoulder flexion: R: 125(154), L: 160(160) Shoulder abduction: R: 95(130), L: 143(143) Elbow: R: 0-148, L: 0-146 Wrist flexion: R: 60(68), L: 79 Writ extension: R: 40(60), L: 0(12) RD: R: 10(24), L: 12(20) UD:  R: 16(24), L: 8(16) Thumb radial abduction: R: 20(25), L: 8(20) Digit flexion to the [facility]: R:  2nd: 4 cm(4cm), 3rd: 6.5cm( 3.5cm), 4th: 7 cm (5cm), 5th: 6cm(5cm) L: 2nd: 8cm(8cm), 3rd: 8cm(7cm), 4th: 8cm(7cm), 5th: 7cm(7cm)   09/13/2022:   Shoulder flexion: R: 125(154), L: 160(160) Shoulder abduction: R: 100(130), L: 143(143) Elbow: R: 0-150, L: 0-150 Wrist flexion: R: 55(68), L: 79 Writ extension: R: 45(60), L: 0(12) RD: R: 10(24), L: 12(20) UD: R: 16(24), L: 8(16) Thumb radial abduction: R: 20(25), L: 8(20) Digit flexion to the [facility]: R:  2nd: 4 cm(4cm), 3rd: 6cm( 5cm), 4th: 6 cm (4cm), 5th: 5cm(5cm) L: 2nd: 7cm(cm), 3rd: 7cm(6cm), 4th: 7cm(6cm), 5th: 6cm(6cm)   10/27/2022   Shoulder flexion: R: 128(154), L: 160(160) Shoulder abduction: R: 105(130), L: 143(143) Elbow: R: 0-150, L: 0-150 Wrist flexion: R: 55(68), L: 79 Writ extension: R: 46(64), L: 0(20) RD: R: 10(20), L: 18(24) UD: R: 18(28), L: 8(20) Thumb radial abduction: R: 30(42), L:20(24) Digit flexion to the [facility]: R:  2nd: 4 cm(3cm), 3rd: 6cm( 6cm), 4th: 6 cm (5cm), 5th: 5cm(5cm) L: 2nd: 8cm(8cm), 3rd: 8cm(6cm), 4th: 8cm(7cm), 5th: 6cm(6cm)  01/19/2023  Shoulder flexion: R: 131(154), L: 160(160) Shoulder abduction: R: 110(130), L: 143(143) Elbow: R: 0-150, L:  0-150 Wrist flexion: R: 55(68), L: 79 Writ extension: R: 46(64), L: 0(20) RD: R: 10(20), L: 18(24) UD: R: 18(28), L: 14(20) Thumb radial abduction: R: 38(48), L:24(26) Thumb IP flexion: R: 35(50) L: 20(45) Digit flexion to the [facility]: R:  2nd: 3.5 cm(3cm), 3rd: 6cm( 6cm), 4th: 6 cm (5cm), 5th: 5cm(4.5cm) L: 2nd: 8cm(7.5cm), 3rd: 8cm(6cm), 4th: 7.5cm(6cm), 5th: 6cm(4.5cm)  04/04/2023  Shoulder flexion: R: 134(154), L: 160(160) Shoulder abduction: R: 114(130), L: 143(143) Elbow: R: WNL, L: WNL Wrist flexion: R: 55(68), L: 80 Writ extension: R: 46(64), L: -10(10) RD: R: 14(24), L: 20(24) UD: R: 20(30), L: 14(20) Thumb radial abduction: R: 38(48),  L:28(30) Thumb IP flexion: R: 40(50) L: 25(45) Digit flexion to the [facility]: R:  2nd: 3.5 cm(3cm), 3rd: 6cm( 5cm), 4th: 6 cm (4cm), 5th: 5cm(5cm) L: 2nd: 8cm(7.5cm), 3rd: 8cm(7cm), 4th: 7.5cm(7cm), 5th: 7cm(6cm)  05/25/2023  Shoulder flexion: R: 134(154), L: 160(160) Shoulder abduction: R: 117(134), L: 143(143) Elbow: R: WNL, L: WNL Wrist flexion: R: 55(68), L: 80 Wrist extension: R: 46(64), L: -8(14) RD: R: 14(24), L: 20(24) UD: R: 20(32), L: 14(22) Thumb radial abduction: R: 38(50), L:28(30) Thumb IP flexion: R: 40(50) L: 30(45) Digit flexion to the [facility]: R:  2nd: 3.5 cm(3cm), 3rd: 6cm( 5cm), 4th: 7 cm (4cm), 5th: 5cm(4cm) L: 2nd: 8cm(7.5cm), 3rd: 8cm(7cm), 4th: 7.5cm(7cm), 5th: 7cm(6cm)  07/13/23  Shoulder flexion: R: 134(154), L: 160(160) Shoulder abduction: R: 122(134), L: 143(143) Elbow: R: WNL, L: WNL Wrist flexion: R: 55(68), L: 80 Wrist extension: R: 50(64), L: -4(18) RD: R: 18(24), L: 24(24) UD: R: 20(32), L: 14(22) Thumb radial abduction: R: 38(50), L:32(30) Thumb IP flexion: R: 42(50) L: 30(45) Digit flexion to the [facility]: R:  2nd: 3.5 cm(3cm), 3rd: 6cm( 5cm), 4th: 7 cm (4cm), 5th: 5cm(4cm) L: 2nd: 8cm(7.5cm), 3rd: 8cm(7cm), 4th: 7.5cm(7cm), 5th: 7cm(6cm)  11/02/23:   Shoulder flexion: R: 136(154), L: 160(160) Shoulder abduction: R: 123(134), L: 143(143) Elbow: R: WNL, L: WNL Wrist flexion: R: 55(68), L: 80 Wrist extension: R: 30(38), L: -16(18) RD: R: 8(18), L: 18(24) UD: R: 12(24), L: 8(22) Thumb radial abduction: R: 28(50), L:28(30) Thumb IP flexion: R: 42(55) L: 10(25) Digit flexion to the [facility]: R:  2nd: 4.5cm(4cm), 3rd: 6cm( 4.5cm), 4th: 6.5cm (5cm), 5th: 5cm(4.5cm) L: 2nd: 8cm(7.5cm), 3rd: 7.5cm(7cm), 4th: 7cm(7cm), 5th: 6cm(6cm)    COORDINATION:   01/19/2023:  9 hole peg test:  Right: 2 pegs placed in 1 min. & 5 sec.  Pt. was able to remove 9 pegs in 17 sec.  Left: 2 pegs placed in 1 min. & 10 sec. Pt. was able to remove 9 pegs in 20  sec.  04/04/23:  Pt. was able to consistently grasp the pegs however each one slipped out of her fingers when attempting to place them into the Pegboard.  05/25/2023:   Right: 9 pegs placed and removed in 2 min. & 41 sec. Left: Unable to grasp the pegs for a horizontal position.  07/13/23    Right: 9 pegs placed and removed in 2 min. & 11 sec.     11/02/23:                Right 9 Hole Peg Test: 2 min. & 51 sec.   SENSATION: Intact  EDEMA: N/A   COGNITION: Overall cognitive status: WNL   VISION: Subjective report: Wears glasses, No changes in vision.  PERCEPTION: Intact  TODAY'S TREATMENT:  DATE:  11/30/23  There. Ex.:   -Pt. tolerated PROM bilateral wrist extension with increased focus on the left wrist extension. PROM for bilateral digit MP, PIP, and DIP flexion, and extension, thumb abduction was performed per Pt. request.       PATIENT EDUCATION: Education details: Bilateral hand  ROM, condition management Person educated: Patient Education method: Explanation, Demonstration, and Verbal cues Education comprehension: verbalized understanding and returned demonstration  HOME EXERCISE PROGRAM: Continue to assess HEP needs, and provide as needed.   GOALS: Goals reviewed with patient? Yes  LONG TERM GOALS: Target date: 01/25/2024  3.  Patient will independently use a reacher to pick up items from various surfaces.  Baseline: 11/02/23: Pt. is now able to consistently pick up small objects from the floor with a reacher. 07/13/2023: Pt. is starting to use a reacher to retrieve items with an adaptation/modification to the trigger. 05/25/2023:  Pt. is now able to initiate grasping a reacher to open and close the reacher with the right hand, however requires the grasp handle to be repositioned  within the hand. Pt. requires work on using/moving the  reacher with the right hand through various planes. 04/04/2023: Pt. continues to work on improving Digit flexion to the [facility] to be able to efficiently handle Adaptive equipment. 01/19/2023: Pt. Is improving with Bilateral digit flexion to the [facility]. Pt. has difficulty firmly holding adaptive devices. 12/06/2022: Pt. continues to present with increased MP, PIP, and DIP digit tightness/stiffness limiting the formulation of bilateral composites fists. 10/27/2022: Pt. presents with increased MP, PIP, and DIP digit tightness/stiffness limiting the formulation of bilateral composites fists during this progress reporting period limiting. Digit flexion to the [facility]: R:  2nd: 4 cm(3cm), 3rd: 6cm( 6cm), 4th: 6 cm (5cm), 5th: 5cm(5cm), L: 2nd: 8cm(8cm), 3rd: 8cm(6cm), 4th: 8cm(7cm), 5th: 6cm(6cm) 09/20/2022: Improving digit flexion to the [facility]. 09/13/22: improved digit flexion to the [facility]. 07/05/2022: Pt. presents with digit MP, PIP, and DIP extensor tightness limiting her ability to securely grip objects in her bilateral hands.  04/07/2022: Pt. Has improved with right 2nd, and 3rd digit flexion towards the [facility]. Pt. Continues to have difficulty securely holding and applying deodorant.02/03/2022: Pt. has improved with digit flexion, however, continues to have difficulty securely holding and using deodorant. 12/09/2021: Bilateral hand/digit MP, PIP, and DIP extension tightness limits her ability achieve digit flexion to hold, and apply deodorant. 10/28/2021:  Pt. continues to have difficulty holding the deodorant. 01/12/2021: Pt. presents with limited digit extension. Pt. Is able to initiate holding deodorant, however is unable to hold it while using it  Goal status:  Achieved   4.  Pt. Will improve bilateral wrist extension in preparation for anticipating, and initiating reaching for objects at the table.  Baseline: 11/30/23: Continue 11/02/23: Wrist extension: R: 30(38), L: -16(18) 07/13/23: Wrist extension: R: 50(64), L: -4(18) 05/25/23:  Wrist extension: R: 46(64), L: -8(14) 04/04/23: R: 46(64) L: -10(10)01/19/2023:  R: 46(64) L: 0(20) 12/06/2022: Pt continues to to progress with bilateral wrist extension in preparation fro functional reaching. 10/27/2022: Pt. Is improving with bilateral wrist extension. R: 46(64) L: 0(20) 09/20/2022: limited PROM in the Left wrist extension 2/2 tightness/stiffness. 09/13/22: R: 55(68), L: 0(12) 07/19/2022: Bilateral wrist extension in limited. 07/05/2022: Right: 40(60) Left: 0(12) 04/07/2022: Right: 40(55) Left: 5(18) 02/03/2022: Right: 34(55) Left: 3(4) 12/09/2021: Right  34(55)  Left  3(4) 10/28/2021: Right 22(38), Left 0(15) 09/14/2021:  Right: 17(35), left 2(15)  Goal status: Ongoing  7.  Pt. Will increase bilateral lateral pinch strength  by 2 lbs to be able to securely grasp items during ADLs, and IADL tasks.  Baseline: 01/19/2023: Deferred as the Pinch meter is out for calibration.12/06/2022: Pt. Is able to more securely hold objects during ADLs/IADLs 10/27/2022: Pt. Is improving with holding items with her bilateral thumbs. (Pinch meter out for calibration) 09/20/2022: lateral pinch continues to be limited 09/13/22: R 6.5# L 4# 07/19/2022: TBD 07/05/2022: TBD 07/2022: NT-Pinch meter out for calibration. 02/03/2022: Right: 6#, Left: 4# 12/09/2021: Right: 6#, Left: 4#  Goal status: Deferred   9.  Pt. will complete plant care with modified independence.  Baseline:12/06/2022: Pt. Is independent with plant care in her husband's room, however is not able to access her plants due to furniture blocking access to them. 10/27/2022: Pt. Is able to water plants that are closer, and within reach. Pt. continues to have difficulty reaching for thorough plant care. 09/20/2022: Pt. Continues to have difficulty reaching for thorough plant care. 09/13/22: continues to report intermittent difficulty 07/19/2022: Pt. Continues to be able to water, and care for some of her plants. Pt. Has more difficulty with plants that are harder to reach.  07/05/2022: Pt. is able to water, and care for some of her plants. Pt. Has more difficulty with plants that are harder to reach. 04/07/2022: Pt. is now able to hold a cup and water her plants.02/03/2022: Pt. has difficulty caring for her plants.  Goal status:Achieved   10.  Pt. will demonstrate adaptive techniques to assist with the efficiency of self-dressing, or morning care tasks.  Baseline: 07/13/23:Pt. continues to work on improving UE functioning to be able to efficiently use Adaptive equipment 05/25/2023: Pt. continues to work on improving UE functioning to be able to efficiently use Adaptive equipment.  04/04/2023:  Pt. continues to work on improving UE functioning to be able to efficiently use Adaptive equipment. 01/19/2203: Pt. Is able to donn her jacket in reverse independently. Pt. requires assist from staff, as Pt. Reports staff have decreased time. 12/06/2022: Pt. requires assist from staff, as Pt. Reports staff have decreased time. 10/27/2022: Pt. is able to assist with initiating UE dressing. Pt. requires assist from personal, and staff care aides. 09/20/2022: Pt. continues to require assist form personal, and staff care aides. 09/13/22: MODA dressing, reports she is often rushed 07/19/2022: Pt. continues to require assist with self-dressing/morning care tasks. 07/05/2022: Continue with goal. 04/07/2022: Pt. Continues to require assist with the efficiency of self-dressing, and morning care tasks.02/03/2022: Pt. requires assist from caregivers 2/2 time limitations during morning care.  Goal status:  Deferred- Has caregiver assist and morning routine in place.  11.  Pt. will improve BUE strength to improve ADL, and IADL functioning.  Baseline: 11/30/2023: Continue 11/02/23: flexion L 4-/5, R 4-/5; shoulder abduction L 4-/5, R 4-/5. elbow flexion B 4/5,01/19/2023: L 4/5, R 3+/5  07/13/23: flexion L 4/5, R 4-/5; shoulder abduction L 4+/5, R 4-/5. elbow flexion B 5/5, elbow extension 4+/5 01/19/2023: L 4/5, R  3+/5  05/25/2023: shoulder flexion L 4/5, R 4-/5; shoulder abduction L 4+/5, R 4-/5. elbow flexion B 5/5, elbow extension 4+/5 01/19/2023: L 4/5, R 3+/5  04/04/2023: shoulder flexion L 4/5, R 3+/5; shoulder abduction L 4+/5, R 3+/5. elbow flexion B 5/5, elbow extension 4/5 01/19/2023: L 4/5, R 3+/5 12/06/2022: Continue 10/27/2022: shoulder flexion L 4/5, R 3+/5; shoulder abduction L 4+/5, R 3+/5. elbow flexion B 5/5, elbow extension 4/5 09/20/2022: Pt. Presents with limited BUE strength 09/13/22: shoulder flexion L 4/5, R 3/5; elbow flexion B 5/5,  elbow extension 4-/5; shoulder abduction L 4+/5, R 3+/5. 07/19/2022: BUE strength continues to be limited. 07/05/2022: shoulder flexion: right 4-/5, abduction: 3+/5, elbow flexion: right: 5/5, left 5/5, extension: right: 4-/5, left 4-/5, wrist extension: right: 3-/5, left: 2-/5  Goal status: Ongoing  12.  Pt. will improve bilateral thumb radial abduction in order to be able to hold the grab bars while standing with PT  Baseline: 11/30/2023: Continue 11/02/23: Thumb radial abduction: R: 28(50), L:28(30) 07/13/23: Pt. Is improving with holding the grab bars with the right hand .05/25/23: Thumb radial abduction: R: 38(50), L:28(30) 04/04/23: R: 38(48), L:28(30) 01/19/2023: R: 38(48) L: 24(26)12/06/2022: Pt. Continues to present  with limited thumb abduction, however is improving holding onto the parallel bars.10/27/2022: thumb radial abduction: R: 30(42), L: 20(24  Goal status: Ongoing  13.  Pt. Will be able to securely hold, and stabilize medication bottles at a tabletop surface when opening, and closing them.  Baseline: 01/19/2023: Pt. Is now able to securely hold medication bottles while opening them, and stabilizes them on surfaces while opening them. 12/06/2022: Pt is improving with securely stabilizing medication bottles. 10/27/2022: Pt. Stabilizes bottles against her torso when attempting to open, and close them   Goal status: Achieved  14: Pt. Will improve bilateral  thumb IP flexion to improve active grasping patterns.    Baseline: 11/30/2023: Continue 11/02/23: Thumb IP flexion: R: 42(55) L: 10(25) 07/13/23: Thumb IP flexion: R: 42(50) L: 30(45) 05/25/2023: Thumb IP flexion: R: 40(50) L: 30(45) 04/04/2023:  R: 40(50) L: 25(45) 01/19/2023: Right 35(50), Left: 20/45)    Goal status: Ongoing    15. Pt. Will improve bilateral FMC/speed, and dexterity. As evidence by improved scores on the 9 hole peg test. Baseline: 11/30/2023: Limited bilateral fine motor coordination;continue 11/02/23: 2 min. & 51 sec. 07/13/2023: Right: 9 pegs placed and removed in 2 min. & 11 sec. 05/25/2023: 9 Hole pegs test speed/dexterity: Right: 9 pegs placed and removed in 2 min. & 41 sec. Left: Unable to grasp the pegs for a horizontal position. 04/04/23: Pt. was able to consistently grasp the pegs however each one slipped out of her fingers when attempting to place them into the Pegboard. 01/19/2023: 2 pegs placed in 1 min. & 5 sec.  Pt. was able to remove 9 pegs in 17 sec.; Left: 2 pegs placed in 1 min. & 10 sec. Pt. was able to remove 9 pegs in 20 sec.   Goal status: Ongoing    16. Pt. Will donn/doff a jacket with modified independence   Baseline: 11/30/2023: Continue 11/02/23: MaxA   Goal status: Ongoing     ASSESSMENT:  CLINICAL IMPRESSION:    Patient reports that her blood pressure was elevated this morning at [facility] prior to coming to therapy. During therapy, Pt.'s BP in upright sitting: 87/54 HR 71 bpms,  Reclined: 88/52 HR 70 bpms, After drinking Gatorade: BP 104/52 HR 67 bpms. Goals were reviewed with the patient.  Patient's measurements were obtained and goals were reviewed, and updated for recertification two treatment sessions ago. Plan to continue with the established goals for this progress reporting period. Pt. tolerated all bilateral hand ROM exercises well.  Pt. continues to benefit from OT services to work on impoving ROM, and UE strength in order to work towards  increasing bilateral hand grasp on objects, and increasing engagement of bilateral hands during ADLs, and IADL tasks     PERFORMANCE DE PERFORMANCE DEFICITS: in functional skills including ADLs, IADLs, coordination, dexterity, ROM, strength, and UE functional  use, cognitive skills including , and psychosocial skills including coping strategies and environmental adaptation.   IMPAIRMENTS: are limiting patient from ADLs, IADLs, and leisure.   CO-MORBIDITIES: may have co-morbidities  that affects occupational performance. Patient will benefit from skilled OT to address above impairments and improve overall function.  MODIFICATION OR ASSISTANCE TO COMPLETE EVALUATION: Min-Moderate modification of tasks or assist with assess necessary to complete an evaluation.  OT OCCUPATIONAL PROFILE AND HISTORY: Detailed assessment: Review of records and additional review of physical, cognitive, psychosocial history related to current functional performance.  CLINICAL DECISION MAKING: Moderate - several treatment options, min-mod task modification necessary  REHAB POTENTIAL: Good for stated goals  PLAN:  OT FREQUENCY 2x's a week  OT DURATION: 12 weeks  PLANNED INTERVENTIONS ADL training, A/E training, UE ther. Ex, Manual therapy, neuromuscular re-education, moist heat modality, Paraffin Bath, Splinting, and  Pt./caregiver education   RECOMMENDED OTHER SERVICES: PT  CONSULTED AND AGREED WITH PLAN OF CARE: Patient  PLAN FOR NEXT SESSION: Continue Treatment as per established POC  Richardson Otter, MS, OTR/L  11/30/23, 11:28 AM

## 2023-12-05 ENCOUNTER — Ambulatory Visit: Admitting: Occupational Therapy

## 2023-12-05 ENCOUNTER — Ambulatory Visit

## 2023-12-07 ENCOUNTER — Ambulatory Visit: Attending: Internal Medicine | Admitting: Occupational Therapy

## 2023-12-07 ENCOUNTER — Ambulatory Visit

## 2023-12-07 DIAGNOSIS — R278 Other lack of coordination: Secondary | ICD-10-CM | POA: Insufficient documentation

## 2023-12-07 DIAGNOSIS — R2689 Other abnormalities of gait and mobility: Secondary | ICD-10-CM | POA: Diagnosis present

## 2023-12-07 DIAGNOSIS — S72001A Fracture of unspecified part of neck of right femur, initial encounter for closed fracture: Secondary | ICD-10-CM | POA: Insufficient documentation

## 2023-12-07 DIAGNOSIS — R2681 Unsteadiness on feet: Secondary | ICD-10-CM

## 2023-12-07 DIAGNOSIS — R296 Repeated falls: Secondary | ICD-10-CM | POA: Diagnosis present

## 2023-12-07 DIAGNOSIS — S72002A Fracture of unspecified part of neck of left femur, initial encounter for closed fracture: Secondary | ICD-10-CM | POA: Insufficient documentation

## 2023-12-07 DIAGNOSIS — M6281 Muscle weakness (generalized): Secondary | ICD-10-CM

## 2023-12-07 DIAGNOSIS — R262 Difficulty in walking, not elsewhere classified: Secondary | ICD-10-CM

## 2023-12-07 NOTE — Therapy (Signed)
 OUTPATIENT PHYSICAL THERAPY NEURO TREATMENT  Patient Name: Sherry [name] MRN: [id number] DOB:29-Nov-1934, 89 y.o., female Today's Date: 12/07/2023   PCP: Richerd Brigham, MD REFERRING PROVIDER: Cleotilde Barrio   PT End of Session - 12/07/23 1133     Visit Number 129    Number of Visits 150    Date for Recertification  12/28/23    Progress Note Due on Visit 130    PT Start Time 1145    PT Stop Time 1225    PT Time Calculation (min) 40 min    Equipment Utilized During Treatment Gait belt    Activity Tolerance Other (comment);Patient tolerated treatment well    Behavior During Therapy [facility] for tasks assessed/performed                            Past Medical History:  Diagnosis Date   Acute blood loss anemia    Arthritis    Cancer (HCC)    skin   Central cord syndrome at C6 level of cervical spinal cord (HCC) 11/29/2017   Hypertension    Protein-calorie malnutrition, severe 01/24/2018   S/P insertion of IVC (inferior vena caval) filter 01/24/2018   Tetraparesis (HCC)    Past Surgical History:  Procedure Laterality Date   ANTERIOR CERVICAL DECOMP/DISCECTOMY FUSION N/A 11/29/2017   Procedure: Cervical five-six, six-seven Anterior Cervical Decompression Fusion;  Surgeon: Cheryle Debby DELENA, MD;  Location: [facility] OR;  Service: Neurosurgery;  Laterality: N/A;  Cervical five-six, six-seven Anterior Cervical Decompression Fusion   CATARACT EXTRACTION     FEMUR IM NAIL Left 08/16/2021   Procedure: INTRAMEDULLARY (IM) NAIL FEMORAL;  Surgeon: Cleotilde Barrio, MD;  Location: [facility] ORS;  Service: Orthopedics;  Laterality: Left;   IR IVC FILTER PLMT / S&I /IMG GUID/MOD SED  12/08/2017   Patient Active Problem List   Diagnosis Date Noted   Iron deficiency anemia 11/22/2023   History of DVT (deep vein thrombosis) 11/22/2023   Chronic pain syndrome 08/25/2023   Orthostatic hypotension 08/25/2023   Age-related osteoporosis without current pathological fracture 07/27/2022    Mild recurrent major depression 07/27/2022   Hypertensive heart disease with other congestive heart failure (HCC) 02/14/2022   Chronic indwelling Foley catheter 02/14/2022   Closed hip fracture (HCC) 08/15/2021   DVT (deep venous thrombosis) (HCC) 08/15/2021   Quadriplegia (HCC) 08/15/2021   Fall 08/15/2021   Constipation due to slow transit 08/31/2018   Neuropathic pain 06/05/2018   Neurogenic bowel 06/05/2018   Healthcare-associated pneumonia 01/25/2018   Chronic allergic rhinitis 01/24/2018   Depression with anxiety 01/24/2018   Protein-calorie malnutrition, severe 01/24/2018   S/P insertion of IVC (inferior vena caval) filter 01/24/2018   Tetraparesis (HCC) 01/20/2018   Neurogenic bladder 01/20/2018   Reactive depression    Benign essential HTN    Acute postoperative anemia due to expected blood loss    Central cord syndrome at C6 level of cervical spinal cord (HCC) 11/29/2017   Allergy to alpha-gal 11/25/2016   SCC (squamous cell carcinoma) 04/08/2014    ONSET DATE: 08/15/2021 (fall with B hip fx); Initial injury was in 2019- quadraplegia due to central cord syndrome of C6 secondary to neck fracture.   REFERRING DIAG: Bilateral Hip fx; Left Open- s/p Left IM nail femoral on 08/16/2021; Right closed  THERAPY DIAG:  Muscle weakness (generalized)  Other lack of coordination  Other abnormalities of gait and mobility  Difficulty in walking, not elsewhere classified  Unsteadiness on feet  Closed fracture  of both hips, initial encounter [facility])  Repeated falls  Rationale for Evaluation and Treatment Rehabilitation  SUBJECTIVE:   SUBJECTIVE STATEMENT:   Patient reports doing okay today. Excited to try the standing frame.        Pt accompanied by: Paid caregiver and dtr-   PERTINENT HISTORY: Sherry [name] is an 87yoF who experienced a fall at home on 08/15/2021 with hip fracture, s/p Left hip ORIF. PMH: quadriplegia due to central cord syndrome of C6 secondary to  neck fracture, HTN, depression with anxiety, neurogenic bladder, bilateral DVT on Xarelto  (s/p of IVC filter placement). Prior history of significant PT/OT with improvement of function. Pt has full use of shoulder/elbows, limited use of hands, adaptive self feeding sucessful.     PAIN:  None current but does endorse some left thigh pain intermittent  OBJECTIVE:   TODAY'S TREATMENT:   12/07/23     Vitals: 95/62 mmHg R UE     Therapeutic activities: Increased time today in set up of using standing frame including bottom pad and knee alignment/foot straps. Once situated patient was able to stand nearly erect for 3 trials varying between 2-4 min today. No symptoms of dizziness or pain today. Patient was protected well- describing stretching type pain in posterior knees.         PATIENT EDUCATION: Education details: Exercise technique Patient verbalized understanding.   HOME EXERCISE PROGRAM: No changes at this time  Access Code: [facility] URL: [email or web address] Date: 11/23/2021 Prepared by: Marina  Moser Exercises - Seated Gluteal Sets - 2 x daily - 7 x weekly - 2 sets - 10 reps - 5 hold - Seated Quad Set - 2 x daily - 7 x weekly - 2 sets - 10 reps - 5 hold - Seated Long Arc Quad - 2 x daily - 7 x weekly - 2 sets - 10 reps - 5 hold - Seated March - 2 x daily - 7 x weekly - 2 sets - 10 reps - 5 hold - Seated Hip Abduction - 2 x daily - 7 x weekly - 2 sets - 10 reps - 5 hold - Seated Shoulder Shrugs - 2 x daily - 7 x weekly - 2 sets - 10 reps - 5 hold - Wheelchair Pressure Relief - 2 x daily - 7 x weekly - 2 sets - 10 reps - 5 hold     GOALS: Goals reviewed with patient? Yes  SHORT TERM GOALS: Target date: 02/22/2022  Pt will be independent with initial UE strengthening HEP in order to improve strength and balance in order to decrease fall risk and improve function at home and work. Baseline: 10/19/2021- patient with no formal UE HEP; 12/02/2021= Patient verbalized  knowledge of HEP including use of theraband for UE strengthening.  Goal status: GOAL MET  LONG TERM GOALS: Target date: 12/28/2023   Pt will be independent with final for UE/LE HEP in order to improve strength and balance in order to decrease fall risk and improve function at home and work. Baseline: Patient is currently BLE NWB and unable to participate in HEP. Has order for UE strengthening. 01/11/2022- Patient now able to participate in LE strengthening although NWB still- good understanding for some basic exercises. Will keep goal active to incorporate progressive LE strengthening exercises. 04/07/2022= Patient still participating in progressive LE seated HEP and has no questions at this time.  Goal status: MET  2.  Pt will improve FOTO to target score of 35  to display perceived improvements in ability  to complete ADL's.  Baseline: 10/19/2021= 12; 12/02/2021= 17; 01/11/2022=17; 04/07/2022= 17; 07/05/2022=17; 10/06/2022- outcome measure not appropriate as patient in non-ambulatory and not consistently standing. Goal status: Goal not appropriate  3.  Pt will increase strength of B UE  by at least 1/2 MMT grade in order to demonstrate improvement in strength and function  Baseline: patient has range of 2-/5 to 4/5 BUE Strength; 12/02/2021= 4/5 except with wrist ext. 01/11/2022= 4/5 B UE strength expept for wrist Goal status: Goal revised-no longer appropriate- working with OT on all UE strengthening.   4. Pt. Will increase strength of RLE by at least 1/2 MMT grade in order to demonstrate improvement in Standing/transfers.  Baseline: 2-/5 Right hip flex/knee ext/flex; 04/07/2022= 2- with right hip flex/knee ext (lacking 28 deg from zero) 4/8= Patient able to ext right knee lacking 18 deg from zero. 07/05/2022=Left knee approx 8 deg from zero and right knee = 23 deg from zero; 12/29/2022 - Left LE = full ROM 4-/5 and right knee ext= 23 deg from zero= 2+/5 (gravity minimized); 03/28/2023= Left knee ext=  4/5 and  right knee ext= 3-/5 (able to move lower leg through incomplete ROM -lacking 20 deg from full ROM against gravity); 05/23/2023= right knee ext= 3-/5 (able to move lower leg through incomplete ROM -lacking 17 deg from full ROM against gravity); 06/20/2023= Right knee ext= lacking 20 deg from zero; 10/05/2023- lacking 24 deg from zero RLE Knee ext  Goal status: Progressing  5. Pt. Will demo ability to stand pivot transfer with max assist for improved functional mobility and less dependent need on mechanical device.   Baseline: Dependent on hoyer lift for all transfers; 04/07/2022- Unable to test secondary to patient with recent UTI and right hand procedure - will attempt to reassess next visit. ; 04/12/22: Unable to successfully stand due to feet plates on loaned power w/c; 05/10/2022- Patient still having to use rental chair and has not received her original power w/c back to practice SPT. 07/04/2021= Patient just received new power w/c and attempted standing from 1st time today- Patient able to stand with max assist today from w/c and did not attempt to perform pivot transfer- difficulty with static standing and placing weight on right LE. 10/06/2022- Not assessed today- patient needs new AFO prior to attempting transfers; 12/29/2022= Patient has not attempted SPT yet - still trying to improve LE strength enough to stand well without knee buckling with weight shifting. 03/28/2023- not attempted today due to ill fitting brace but patient last performed SPT on 01/10/23 and was able to perform with Max A with feet pre-positioned to angle toward direction of transfer; 05/23/2023- Not tested today but on last visit- patient able to perform SPT with max assist yet concern for twisting knee as she was unable to turn/pivot foot .  06/20/2023- deferred to next session due to patient fatigued with static standing today. 10/05/2023-Patient able to SPT with Max assist yet still unable to move LE to pivot so transfers are performed very  slowly.   Goal status: MET  6. Pt. Will demonstrate improved functional LE strength as seen by ability to stand > 2 min for improved transfer ability and pregait abilities.   Baseline: not assessed today due to recent dx: UTI; 04/07/2022- Unable to test secondary to patient with recent UTI and right hand procedure - will attempt to reassess next visit; 04/12/22: Unable to successfully stand due to feet plates on loaned power w/c 05/10/2022- Patient still having to use rental  chair and has not received her original power w/c back to practice Standing. 07/05/2022- Patient able to stand with max assist at trunk today for 2 min 3 sec. Will keep goal active to ensure consistency. 10/06/2022= Unable to assess today- patient with some recent blood pressure issues and needs new AFO. 11/24/2022= Patient demonstrated multiple rounds of standing in // bars- holding up to 1:45 min today but has been able to exhibit 2 min in recent past- working on consistency. 12/29/2022= 2 min 40 sec with patient demonstrating improved overall posture and ability to contract glutes and quads with just min assist at trunk today.   Goal status: MET  7. Pt. Will demonstrate improved functional LE strength as seen by ability to stand > 5 min for improved transfer ability and pregait abilities. Baseline: 12/29/2022= 2 min 40 sec with patient demonstrating improved overall posture and ability to contract glutes and quads with just min assist at trunk today. 03/28/2023= Attempted 2 trials today - at best 2 min 48 sec today but during cert- did stand for 3 min 44 sec. 06/20/2023= Patient able to stand 2x (45 sec and 1 min 12 sec)- max assist yet no slippage of LLE.  07/13/2023= Patient able to stand x 3 trials today- each 1-2 min focusing on posture- patient with improving gluteal activation and less physical assist once in standing position. 10/05/2023= Patient able to stand for 1 min 11 sec today- Left knee buckled.  Goal Status: Ongoing  8. Patient  will perform sit to stand transfer with moderate assist consistently > 75% of time for improved transfer ability and less dependence on caregiver.   Baseline: 12/29/2022- Patient currently max assist level assistance with all sit to stand activities. 03/28/2023= Patient is still majority of max assist to stand yet able to pull up from wheelchair smoother in one motion. 06/20/2023- Max Assist but has only most recently resumed standing since last week so goal still appropriate for new cert; 3/88/7974=dupoo max assist but less overall assist on last trial of standing with improved forward lean and gluteal activation. 10/05/2023= Patient able to transfer with Max A with inability to pivot/move her LE - Increased risk of injury.   Goal status: ONGOING  9. 4. Pt. Will increase strength of RLE as seen by ability to achieve < 15 deg from zero with R knee ext in order to demonstrate improvement in Standing/transfers.  Baseline: 10/05/2023= lacking 24 deg from zero (active R knee ext - measured sitting in power w/c)  Goal status: NEW    ASSESSMENT:  CLINICAL IMPRESSION:   Patient was able to attempt static standing in standing frame today with good results. No dizziness or lightheaded symptoms. No pain reported with standing except some stretching sensation in back of LE and in B hip flexors. Patient was able to stand with help of ball between knee to minimize genu valgus. She was pleased with ability to stand again. Pt will continue to benefit from skilled PT services to optimize independence and reduced caregiver support.   OBJECTIVE IMPAIRMENTS Abnormal gait, decreased activity tolerance, decreased balance, decreased coordination, decreased endurance, decreased mobility, difficulty walking, decreased ROM, decreased strength, hypomobility, impaired flexibility, impaired UE functional use, postural dysfunction, and pain.   ACTIVITY LIMITATIONS carrying, lifting, bending, sitting, standing, squatting, sleeping,  stairs, transfers, bed mobility, continence, bathing, toileting, dressing, self feeding, reach over head, hygiene/grooming, and caring for others  PARTICIPATION LIMITATIONS: meal prep, cleaning, laundry, medication management, personal finances, interpersonal relationship, driving, shopping, community activity, and  yard work  PERSONAL FACTORS Age, Time since onset of injury/illness/exacerbation, and 1-2 comorbidities: HTN, cervical Sx are also affecting patient's functional outcome.   REHAB POTENTIAL: Good  CLINICAL DECISION MAKING: Evolving/moderate complexity  EVALUATION COMPLEXITY: Moderate  PLAN: PT FREQUENCY: 1-2x/week  PT DURATION: 12 weeks  PLANNED INTERVENTIONS: Therapeutic exercises, Therapeutic activity, Neuromuscular re-education, Balance training, Gait training, Patient/Family education, Self Care, Joint mobilization, DME instructions, Dry Needling, Electrical stimulation, Wheelchair mobility training, Spinal mobilization, Cryotherapy, Moist heat, and Manual therapy  PLAN FOR NEXT SESSION:   Standing frame if available Continue with LE and core strength training  Sit to stand and static standing trials to promote improved standing. SPT as able.     1:51 PM, 12/07/23  1:51 PM, 12/07/23 Chyrl London, PT Physical Therapist - [place] [facility]  Outpatient Physical Therapy- Main Campus [phone]

## 2023-12-07 NOTE — Therapy (Signed)
 Occupational Therapy Neuro Treatment Note  Patient Name: Sherry [name] MRN: [id number] DOB:April 26, 1934, 89 y.o., female Today's Date: 11/29/2022  PCP:  [facility] PROVIDER: Abdul Fine, MD  END OF SESSION:   OT End of Session - 12/07/23 1722     Visit Number 141    Number of Visits 192    Date for Recertification  01/25/24    OT Start Time 1100    OT Stop Time 1145    OT Time Calculation (min) 45 min    Equipment Utilized During Treatment powered wheelchair    Activity Tolerance Patient tolerated treatment well    Behavior During Therapy WFL for tasks assessed/performed              Past Medical History:  Diagnosis Date   Acute blood loss anemia    Arthritis    Cancer (HCC)    skin   Central cord syndrome at C6 level of cervical spinal cord (HCC) 11/29/2017   Hypertension    Protein-calorie malnutrition, severe (HCC) 01/24/2018   S/P insertion of IVC (inferior vena caval) filter 01/24/2018   Tetraparesis (HCC)    Past Surgical History:  Procedure Laterality Date   ANTERIOR CERVICAL DECOMP/DISCECTOMY FUSION N/A 11/29/2017   Procedure: Cervical five-six, six-seven Anterior Cervical Decompression Fusion;  Surgeon: Cheryle Debby DELENA, MD;  Location: [facility] OR;  Service: Neurosurgery;  Laterality: N/A;  Cervical five-six, six-seven Anterior Cervical Decompression Fusion   CATARACT EXTRACTION     FEMUR IM NAIL Left 08/16/2021   Procedure: INTRAMEDULLARY (IM) NAIL FEMORAL;  Surgeon: Cleotilde Barrio, MD;  Location: [facility] ORS;  Service: Orthopedics;  Laterality: Left;   IR IVC FILTER PLMT / S&I /IMG GUID/MOD SED  12/08/2017   Patient Active Problem List   Diagnosis Date Noted   Age-related osteoporosis without current pathological fracture 07/27/2022   Mild recurrent major depression (HCC) 07/27/2022   Hypertensive heart disease with other congestive heart failure (HCC) 02/14/2022   Chronic indwelling Foley catheter 02/14/2022   Closed hip fracture (HCC) 08/15/2021    DVT (deep venous thrombosis) (HCC) 08/15/2021   Quadriplegia (HCC) 08/15/2021   Fall 08/15/2021   Constipation due to slow transit 08/31/2018   Trauma 06/05/2018   Neuropathic pain 06/05/2018   Neurogenic bowel 06/05/2018   Vaginal yeast infection 01/30/2018   Healthcare-associated pneumonia 01/25/2018   Chronic allergic rhinitis 01/24/2018   Depression with anxiety 01/24/2018   UTI due to Klebsiella species 01/24/2018   Protein-calorie malnutrition, severe (HCC) 01/24/2018   S/P insertion of IVC (inferior vena caval) filter 01/24/2018   Tetraparesis (HCC) 01/20/2018   Neurogenic bladder 01/20/2018   Reactive depression    Benign essential HTN    Acute postoperative anemia due to expected blood loss    Central cord syndrome at C6 level of cervical spinal cord (HCC) 11/29/2017   Allergy to alpha-gal 11/25/2016   SCC (squamous cell carcinoma) 04/08/2014   ONSET DATE: 11/29/2017  REFERRING DIAG: Central Cord Syndrome at C6 of the Cervical Spinal Cord, Fall with Bilateral Closed Hip Fractures, with ORIF repair with Intramedullary Nailing of the Left Hip Fracture.   THERAPY DIAG:  Muscle weakness (generalized)  Other lack of coordination  Rationale for Evaluation and Treatment: Rehabilitation  SUBJECTIVE:  SUBJECTIVE STATEMENT:  Patient reports having had elevated blood pressure this morning.  Pt accompanied by: self, Personal Care aide  PERTINENT HISTORY: Central Cord Syndrome at C6 of the Cervical Spinal Cord, Fall with Bilateral Closed Hip Fractures, with ORIF repair with Intramedullary Nailing of the  Left Hip Fracture.   PRECAUTIONS: None  WEIGHT BEARING RESTRICTIONS: No  PAIN:  Are you having pain? No pain   LIVING ENVIRONMENT: Lives with: [facility] Stairs: No Has following equipment at home: Wheelchair (power)  PLOF: Independent   PATIENT GOALS:  See below for established goals  OBJECTIVE:  Note: Objective measures were completed at  Evaluation unless otherwise noted.  HAND DOMINANCE: Right  ADLs: Caregiver assist with ADLs, and [facility]  MOBILITY STATUS: Uses a power w/c  ACTIVITY TOLERANCE: Activity tolerance: Fair  FUNCTIONAL OUTCOME MEASURES:  Measurements:   12/09/2021:   Shoulder flexion: R: 120(136), L: 138(150) Shoulder abduction: R: 108(120), L: 110(120) Elbow: R: 0-148, L: 0-146 Wrist flexion: R: 55(62), L: 78 Writ extension: R: 34(55), L: 3(4) RD: R: 12(20), L: 16(26) UD: R: 8(24), L: 6(18) Thumb radial abduction: R: 11(20), L: 8(20) Digit flexion to the [facility]: R:  2nd: 5cm(3.5cm), 3rd: 7.5 cm(5cm), 4th: 7cm(3cm), 5th: 6cm(5.5cm) L: 2nd: 8cm(8cm), 3rd: 8cm(8cm), 4th: 7.5cm(7cm), 5th: 7cm(7cm)   02/03/2022:   Shoulder flexion: R: 122(138), L: 148(154) Shoulder abduction: R: 109(125), L: 125(125 Elbow: R: 0-148, L: 0-146 Wrist flexion: R: 60(68), L: 79 Writ extension: R: 40(55), L: 3(10) RD: R: 14(24), L: 20(28) UD: R: 8(24), L: 6(18) Thumb radial abduction: R: 20(25), L: 8(20) Digit flexion to the [facility]: R:  2nd: 4.5cm(3cm), 3rd: 6.5cm(4.5cm), 4th: 6.5 cm(3cm), 5th: 4.5cm(5cm) L: 2nd: 8cm(8cm), 3rd: 8cm(7cm), 4th: 7.5cm(7cm), 5th: 6.5cm(6.5cm)   04/07/2022:   Shoulder flexion: R: 125(150), L: 160(160) Shoulder abduction: R: 109(125), L: 125(125 Elbow: R: 0-148, L: 0-146 Wrist flexion: R: 60(68), L: 79 Writ extension: R: 40(55), L: 5(18) RD: R: 14(24), L: 20(28) UD: R: 18(24), L: 8(18) Thumb radial abduction: R: 20(25), L: 8(20) Digit flexion to the [facility]: R:  2nd: 4.5 cm(4 cm), 3rd: 6.5cm(3cm), 4th: 7 cm (5cm), 5th: 5cm(5cm) L: 2nd: 8cm(8cm), 3rd: 8cm(7cm), 4th: 7.5cm(7cm), 5th: 7cm(7cm)   07/05/2022:   Shoulder flexion: R: 125(154), L: 160(160) Shoulder abduction: R: 95(130), L: 143(143) Elbow: R: 0-148, L: 0-146 Wrist flexion: R: 60(68), L: 79 Writ extension: R: 40(60), L: 0(12) RD: R: 10(24), L: 12(20) UD: R: 16(24), L: 8(16) Thumb radial abduction: R: 20(25), L:  8(20) Digit flexion to the [facility]: R:  2nd: 4 cm(4cm), 3rd: 6.5cm( 3.5cm), 4th: 7 cm (5cm), 5th: 6cm(5cm) L: 2nd: 8cm(8cm), 3rd: 8cm(7cm), 4th: 8cm(7cm), 5th: 7cm(7cm)   09/13/2022:   Shoulder flexion: R: 125(154), L: 160(160) Shoulder abduction: R: 100(130), L: 143(143) Elbow: R: 0-150, L: 0-150 Wrist flexion: R: 55(68), L: 79 Writ extension: R: 45(60), L: 0(12) RD: R: 10(24), L: 12(20) UD: R: 16(24), L: 8(16) Thumb radial abduction: R: 20(25), L: 8(20) Digit flexion to the [facility]: R:  2nd: 4 cm(4cm), 3rd: 6cm( 5cm), 4th: 6 cm (4cm), 5th: 5cm(5cm) L: 2nd: 7cm(cm), 3rd: 7cm(6cm), 4th: 7cm(6cm), 5th: 6cm(6cm)   10/27/2022   Shoulder flexion: R: 128(154), L: 160(160) Shoulder abduction: R: 105(130), L: 143(143) Elbow: R: 0-150, L: 0-150 Wrist flexion: R: 55(68), L: 79 Writ extension: R: 46(64), L: 0(20) RD: R: 10(20), L: 18(24) UD: R: 18(28), L: 8(20) Thumb radial abduction: R: 30(42), L:20(24) Digit flexion to the [facility]: R:  2nd: 4 cm(3cm), 3rd: 6cm( 6cm), 4th: 6 cm (5cm), 5th: 5cm(5cm) L: 2nd: 8cm(8cm), 3rd: 8cm(6cm), 4th: 8cm(7cm), 5th: 6cm(6cm)  01/19/2023  Shoulder flexion: R: 131(154), L: 160(160) Shoulder abduction: R: 110(130), L: 143(143) Elbow: R: 0-150, L: 0-150 Wrist flexion: R: 55(68), L: 79 Writ extension: R: 46(64), L:  0(20) RD: R: 10(20), L: 18(24) UD: R: 18(28), L: 14(20) Thumb radial abduction: R: 38(48), L:24(26) Thumb IP flexion: R: 35(50) L: 20(45) Digit flexion to the [facility]: R:  2nd: 3.5 cm(3cm), 3rd: 6cm( 6cm), 4th: 6 cm (5cm), 5th: 5cm(4.5cm) L: 2nd: 8cm(7.5cm), 3rd: 8cm(6cm), 4th: 7.5cm(6cm), 5th: 6cm(4.5cm)  04/04/2023  Shoulder flexion: R: 134(154), L: 160(160) Shoulder abduction: R: 114(130), L: 143(143) Elbow: R: WNL, L: WNL Wrist flexion: R: 55(68), L: 80 Writ extension: R: 46(64), L: -10(10) RD: R: 14(24), L: 20(24) UD: R: 20(30), L: 14(20) Thumb radial abduction: R: 38(48), L:28(30) Thumb IP flexion: R: 40(50) L: 25(45) Digit flexion to the  [facility]: R:  2nd: 3.5 cm(3cm), 3rd: 6cm( 5cm), 4th: 6 cm (4cm), 5th: 5cm(5cm) L: 2nd: 8cm(7.5cm), 3rd: 8cm(7cm), 4th: 7.5cm(7cm), 5th: 7cm(6cm)  05/25/2023  Shoulder flexion: R: 134(154), L: 160(160) Shoulder abduction: R: 117(134), L: 143(143) Elbow: R: WNL, L: WNL Wrist flexion: R: 55(68), L: 80 Wrist extension: R: 46(64), L: -8(14) RD: R: 14(24), L: 20(24) UD: R: 20(32), L: 14(22) Thumb radial abduction: R: 38(50), L:28(30) Thumb IP flexion: R: 40(50) L: 30(45) Digit flexion to the [facility]: R:  2nd: 3.5 cm(3cm), 3rd: 6cm( 5cm), 4th: 7 cm (4cm), 5th: 5cm(4cm) L: 2nd: 8cm(7.5cm), 3rd: 8cm(7cm), 4th: 7.5cm(7cm), 5th: 7cm(6cm)  07/13/23  Shoulder flexion: R: 134(154), L: 160(160) Shoulder abduction: R: 122(134), L: 143(143) Elbow: R: WNL, L: WNL Wrist flexion: R: 55(68), L: 80 Wrist extension: R: 50(64), L: -4(18) RD: R: 18(24), L: 24(24) UD: R: 20(32), L: 14(22) Thumb radial abduction: R: 38(50), L:32(30) Thumb IP flexion: R: 42(50) L: 30(45) Digit flexion to the [facility]: R:  2nd: 3.5 cm(3cm), 3rd: 6cm( 5cm), 4th: 7 cm (4cm), 5th: 5cm(4cm) L: 2nd: 8cm(7.5cm), 3rd: 8cm(7cm), 4th: 7.5cm(7cm), 5th: 7cm(6cm)  11/02/23:   Shoulder flexion: R: 136(154), L: 160(160) Shoulder abduction: R: 123(134), L: 143(143) Elbow: R: WNL, L: WNL Wrist flexion: R: 55(68), L: 80 Wrist extension: R: 30(38), L: -16(18) RD: R: 8(18), L: 18(24) UD: R: 12(24), L: 8(22) Thumb radial abduction: R: 28(50), L:28(30) Thumb IP flexion: R: 42(55) L: 10(25) Digit flexion to the [facility]: R:  2nd: 4.5cm(4cm), 3rd: 6cm( 4.5cm), 4th: 6.5cm (5cm), 5th: 5cm(4.5cm) L: 2nd: 8cm(7.5cm), 3rd: 7.5cm(7cm), 4th: 7cm(7cm), 5th: 6cm(6cm)    COORDINATION:   01/19/2023:  9 hole peg test:  Right: 2 pegs placed in 1 min. & 5 sec.  Pt. was able to remove 9 pegs in 17 sec.  Left: 2 pegs placed in 1 min. & 10 sec. Pt. was able to remove 9 pegs in 20 sec.  04/04/23:  Pt. was able to consistently grasp the pegs however each one  slipped out of her fingers when attempting to place them into the Pegboard.  05/25/2023:   Right: 9 pegs placed and removed in 2 min. & 41 sec. Left: Unable to grasp the pegs for a horizontal position.  07/13/23    Right: 9 pegs placed and removed in 2 min. & 11 sec.     11/02/23:                Right 9 Hole Peg Test: 2 min. & 51 sec.   SENSATION: Intact  EDEMA: N/A   COGNITION: Overall cognitive status: WNL   VISION: Subjective report: Wears glasses, No changes in vision.  PERCEPTION: Intact  TODAY'S TREATMENT:  DATE: 12/07/23  There. Ex.:   -Pt. tolerated PROM bilateral wrist extension with increased focus on the left wrist extension. PROM for bilateral digit MP, PIP, and DIP flexion, and extension, thumb abduction was performed per Pt. request. -Performed BUE strengthening using a 3# dowel ex. 2/2 to weakness. Bilateral shoulder flexion, chest press, circular patterns, and elbow flexion/extension for 1 set 10 reps each. -Pt. worked on bilateral UE reaching through multiple targets, and planes with the 3# dowel      PATIENT EDUCATION: Education details: Bilateral hand  ROM, condition management Person educated: Patient Education method: Explanation, Demonstration, and Verbal cues Education comprehension: verbalized understanding and returned demonstration  HOME EXERCISE PROGRAM: Continue to assess HEP needs, and provide as needed.   GOALS: Goals reviewed with patient? Yes  LONG TERM GOALS: Target date: 01/25/2024  3.  Patient will independently use a reacher to pick up items from various surfaces.  Baseline: 11/02/23: Pt. is now able to consistently pick up small objects from the floor with a reacher. 07/13/2023: Pt. is starting to use a reacher to retrieve items with an adaptation/modification to the trigger. 05/25/2023:  Pt. is now able to  initiate grasping a reacher to open and close the reacher with the right hand, however requires the grasp handle to be repositioned  within the hand. Pt. requires work on using/moving the reacher with the right hand through various planes. 04/04/2023: Pt. continues to work on improving Digit flexion to the [facility] to be able to efficiently handle Adaptive equipment. 01/19/2023: Pt. Is improving with Bilateral digit flexion to the [facility] Pt. has difficulty firmly holding adaptive devices. 12/06/2022: Pt. continues to present with increased MP, PIP, and DIP digit tightness/stiffness limiting the formulation of bilateral composites fists. 10/27/2022: Pt. presents with increased MP, PIP, and DIP digit tightness/stiffness limiting the formulation of bilateral composites fists during this progress reporting period limiting. Digit flexion to the [facility]: R:  2nd: 4 cm(3cm), 3rd: 6cm( 6cm), 4th: 6 cm (5cm), 5th: 5cm(5cm), L: 2nd: 8cm(8cm), 3rd: 8cm(6cm), 4th: 8cm(7cm), 5th: 6cm(6cm) 09/20/2022: Improving digit flexion to the [facility]. 09/13/22: improved digit flexion to the [facility]. 07/05/2022: Pt. presents with digit MP, PIP, and DIP extensor tightness limiting her ability to securely grip objects in her bilateral hands.  04/07/2022: Pt. Has improved with right 2nd, and 3rd digit flexion towards the [facility]. Pt. Continues to have difficulty securely holding and applying deodorant.02/03/2022: Pt. has improved with digit flexion, however, continues to have difficulty securely holding and using deodorant. 12/09/2021: Bilateral hand/digit MP, PIP, and DIP extension tightness limits her ability achieve digit flexion to hold, and apply deodorant. 10/28/2021:  Pt. continues to have difficulty holding the deodorant. 01/12/2021: Pt. presents with limited digit extension. Pt. Is able to initiate holding deodorant, however is unable to hold it while using it  Goal status:  Achieved   4.  Pt. Will improve bilateral wrist extension in preparation for  anticipating, and initiating reaching for objects at the table.  Baseline: 11/30/23: Continue 11/02/23: Wrist extension: R: 30(38), L: -16(18) 07/13/23: Wrist extension: R: 50(64), L: -4(18) 05/25/23: Wrist extension: R: 46(64), L: -8(14) 04/04/23: R: 46(64) L: -10(10)01/19/2023:  R: 46(64) L: 0(20) 12/06/2022: Pt continues to to progress with bilateral wrist extension in preparation fro functional reaching. 10/27/2022: Pt. Is improving with bilateral wrist extension. R: 46(64) L: 0(20) 09/20/2022: limited PROM in the Left wrist extension 2/2 tightness/stiffness. 09/13/22: R: 55(68), L: 0(12) 07/19/2022: Bilateral wrist extension in limited. 07/05/2022: Right: 40(60) Left: 0(12) 04/07/2022: Right: 40(55)  Left: 5(18) 02/03/2022: Right: 34(55) Left: 3(4) 12/09/2021: Right  34(55)  Left  3(4) 10/28/2021: Right 22(38), Left 0(15) 09/14/2021:  Right: 17(35), left 2(15)  Goal status: Ongoing  7.  Pt. Will increase bilateral lateral pinch strength by 2 lbs to be able to securely grasp items during ADLs, and IADL tasks.  Baseline: 01/19/2023: Deferred as the Pinch meter is out for calibration.12/06/2022: Pt. Is able to more securely hold objects during ADLs/IADLs 10/27/2022: Pt. Is improving with holding items with her bilateral thumbs. (Pinch meter out for calibration) 09/20/2022: lateral pinch continues to be limited 09/13/22: R 6.5# L 4# 07/19/2022: TBD 07/05/2022: TBD 07/2022: NT-Pinch meter out for calibration. 02/03/2022: Right: 6#, Left: 4# 12/09/2021: Right: 6#, Left: 4#  Goal status: Deferred   9.  Pt. will complete plant care with modified independence.  Baseline:12/06/2022: Pt. Is independent with plant care in her husband's room, however is not able to access her plants due to furniture blocking access to them. 10/27/2022: Pt. Is able to water plants that are closer, and within reach. Pt. continues to have difficulty reaching for thorough plant care. 09/20/2022: Pt. Continues to have difficulty reaching for thorough plant  care. 09/13/22: continues to report intermittent difficulty 07/19/2022: Pt. Continues to be able to water, and care for some of her plants. Pt. Has more difficulty with plants that are harder to reach. 07/05/2022: Pt. is able to water, and care for some of her plants. Pt. Has more difficulty with plants that are harder to reach. 04/07/2022: Pt. is now able to hold a cup and water her plants.02/03/2022: Pt. has difficulty caring for her plants.  Goal status:Achieved   10.  Pt. will demonstrate adaptive techniques to assist with the efficiency of self-dressing, or morning care tasks.  Baseline: 07/13/23:Pt. continues to work on improving UE functioning to be able to efficiently use Adaptive equipment 05/25/2023: Pt. continues to work on improving UE functioning to be able to efficiently use Adaptive equipment.  04/04/2023:  Pt. continues to work on improving UE functioning to be able to efficiently use Adaptive equipment. 01/19/2203: Pt. Is able to donn her jacket in reverse independently. Pt. requires assist from staff, as Pt. Reports staff have decreased time. 12/06/2022: Pt. requires assist from staff, as Pt. Reports staff have decreased time. 10/27/2022: Pt. is able to assist with initiating UE dressing. Pt. requires assist from personal, and staff care aides. 09/20/2022: Pt. continues to require assist form personal, and staff care aides. 09/13/22: MODA dressing, reports she is often rushed 07/19/2022: Pt. continues to require assist with self-dressing/morning care tasks. 07/05/2022: Continue with goal. 04/07/2022: Pt. Continues to require assist with the efficiency of self-dressing, and morning care tasks.02/03/2022: Pt. requires assist from caregivers 2/2 time limitations during morning care.  Goal status:  Deferred- Has caregiver assist and morning routine in place.  11.  Pt. will improve BUE strength to improve ADL, and IADL functioning.  Baseline: 11/30/2023: Continue 11/02/23: flexion L 4-/5, R 4-/5; shoulder  abduction L 4-/5, R 4-/5. elbow flexion B 4/5,01/19/2023: L 4/5, R 3+/5  07/13/23: flexion L 4/5, R 4-/5; shoulder abduction L 4+/5, R 4-/5. elbow flexion B 5/5, elbow extension 4+/5 01/19/2023: L 4/5, R 3+/5  05/25/2023: shoulder flexion L 4/5, R 4-/5; shoulder abduction L 4+/5, R 4-/5. elbow flexion B 5/5, elbow extension 4+/5 01/19/2023: L 4/5, R 3+/5  04/04/2023: shoulder flexion L 4/5, R 3+/5; shoulder abduction L 4+/5, R 3+/5. elbow flexion B 5/5, elbow extension 4/5 01/19/2023: L 4/5, R 3+/5  12/06/2022: Continue 10/27/2022: shoulder flexion L 4/5, R 3+/5; shoulder abduction L 4+/5, R 3+/5. elbow flexion B 5/5, elbow extension 4/5 09/20/2022: Pt. Presents with limited BUE strength 09/13/22: shoulder flexion L 4/5, R 3/5; elbow flexion B 5/5, elbow extension 4-/5; shoulder abduction L 4+/5, R 3+/5. 07/19/2022: BUE strength continues to be limited. 07/05/2022: shoulder flexion: right 4-/5, abduction: 3+/5, elbow flexion: right: 5/5, left 5/5, extension: right: 4-/5, left 4-/5, wrist extension: right: 3-/5, left: 2-/5  Goal status: Ongoing  12.  Pt. will improve bilateral thumb radial abduction in order to be able to hold the grab bars while standing with PT  Baseline: 11/30/2023: Continue 11/02/23: Thumb radial abduction: R: 28(50), L:28(30) 07/13/23: Pt. Is improving with holding the grab bars with the right hand .05/25/23: Thumb radial abduction: R: 38(50), L:28(30) 04/04/23: R: 38(48), L:28(30) 01/19/2023: R: 38(48) L: 24(26)12/06/2022: Pt. Continues to present  with limited thumb abduction, however is improving holding onto the parallel bars.10/27/2022: thumb radial abduction: R: 30(42), L: 20(24  Goal status: Ongoing  13.  Pt. Will be able to securely hold, and stabilize medication bottles at a tabletop surface when opening, and closing them.  Baseline: 01/19/2023: Pt. Is now able to securely hold medication bottles while opening them, and stabilizes them on surfaces while opening them. 12/06/2022: Pt is  improving with securely stabilizing medication bottles. 10/27/2022: Pt. Stabilizes bottles against her torso when attempting to open, and close them   Goal status: Achieved  14: Pt. Will improve bilateral thumb IP flexion to improve active grasping patterns.    Baseline: 11/30/2023: Continue 11/02/23: Thumb IP flexion: R: 42(55) L: 10(25) 07/13/23: Thumb IP flexion: R: 42(50) L: 30(45) 05/25/2023: Thumb IP flexion: R: 40(50) L: 30(45) 04/04/2023:  R: 40(50) L: 25(45) 01/19/2023: Right 35(50), Left: 20/45)    Goal status: Ongoing    15. Pt. Will improve bilateral FMC/speed, and dexterity. As evidence by improved scores on the 9 hole peg test. Baseline: 11/30/2023: Limited bilateral fine motor coordination;continue 11/02/23: 2 min. & 51 sec. 07/13/2023: Right: 9 pegs placed and removed in 2 min. & 11 sec. 05/25/2023: 9 Hole pegs test speed/dexterity: Right: 9 pegs placed and removed in 2 min. & 41 sec. Left: Unable to grasp the pegs for a horizontal position. 04/04/23: Pt. was able to consistently grasp the pegs however each one slipped out of her fingers when attempting to place them into the Pegboard. 01/19/2023: 2 pegs placed in 1 min. & 5 sec.  Pt. was able to remove 9 pegs in 17 sec.; Left: 2 pegs placed in 1 min. & 10 sec. Pt. was able to remove 9 pegs in 20 sec.   Goal status: Ongoing    16. Pt. Will donn/doff a jacket with modified independence   Baseline: 11/30/2023: Continue 11/02/23: MaxA   Goal status: Ongoing     ASSESSMENT:  CLINICAL IMPRESSION:    Upon arrival, Pt.'s BP in upright sitting: 144/81 HR 80 bpms. Pt. reports feeling fatigued during the dowel exercises this morning. Pt. tolerated the moist heat modality well, as well as  the bilateral wrist, and digit ROM. Pt. tolerated all bilateral hand ROM exercises well, however continues to present with bilateral hand digit extension tightness, and wrist stiffness/tightness. Pt. continues to benefit from OT services to work on impoving ROM,  and UE strength in order to work towards increasing bilateral hand grasp on objects, and increasing engagement of bilateral hands during ADLs, and IADL tasks     PERFORMANCE DE PERFORMANCE DEFICITS: in  functional skills including ADLs, IADLs, coordination, dexterity, ROM, strength, and UE functional use, cognitive skills including , and psychosocial skills including coping strategies and environmental adaptation.   IMPAIRMENTS: are limiting patient from ADLs, IADLs, and leisure.   CO-MORBIDITIES: may have co-morbidities  that affects occupational performance. Patient will benefit from skilled OT to address above impairments and improve overall function.  MODIFICATION OR ASSISTANCE TO COMPLETE EVALUATION: Min-Moderate modification of tasks or assist with assess necessary to complete an evaluation.  OT OCCUPATIONAL PROFILE AND HISTORY: Detailed assessment: Review of records and additional review of physical, cognitive, psychosocial history related to current functional performance.  CLINICAL DECISION MAKING: Moderate - several treatment options, min-mod task modification necessary  REHAB POTENTIAL: Good for stated goals  PLAN:  OT FREQUENCY 2x's a week  OT DURATION: 12 weeks  PLANNED INTERVENTIONS ADL training, A/E training, UE ther. Ex, Manual therapy, neuromuscular re-education, moist heat modality, Paraffin Bath, Splinting, and  Pt./caregiver education   RECOMMENDED OTHER SERVICES: PT  CONSULTED AND AGREED WITH PLAN OF CARE: Patient  PLAN FOR NEXT SESSION: Continue Treatment as per established POC  Richardson Otter, MS, OTR/L  12/07/23, 5:27 PM

## 2023-12-12 ENCOUNTER — Ambulatory Visit: Admitting: Occupational Therapy

## 2023-12-12 ENCOUNTER — Ambulatory Visit

## 2023-12-12 DIAGNOSIS — M6281 Muscle weakness (generalized): Secondary | ICD-10-CM

## 2023-12-12 NOTE — Therapy (Addendum)
 Occupational Therapy Neuro Treatment Note  Patient Name: Sherry [name] MRN: [id number] DOB:03-15-1934, 89 y.o., female Today's Date: 11/29/2022  PCP:  [facility] PROVIDER: Abdul Fine, MD  END OF SESSION:   OT End of Session - 12/12/23 1607     Visit Number [id number]    Number of Visits 192    Date for Recertification  01/25/24    OT Start Time 1100    OT Stop Time 1145    OT Time Calculation (min) 45 min    Activity Tolerance Patient tolerated treatment well    Behavior During Therapy [facility] for tasks assessed/performed              Past Medical History:  Diagnosis Date   Acute blood loss anemia    Arthritis    Cancer (HCC)    skin   Central cord syndrome at C6 level of cervical spinal cord (HCC) 11/29/2017   Hypertension    Protein-calorie malnutrition, severe (HCC) 01/24/2018   S/P insertion of IVC (inferior vena caval) filter 01/24/2018   Tetraparesis (HCC)    Past Surgical History:  Procedure Laterality Date   ANTERIOR CERVICAL DECOMP/DISCECTOMY FUSION N/A 11/29/2017   Procedure: Cervical five-six, six-seven Anterior Cervical Decompression Fusion;  Surgeon: Cheryle Debby DELENA, MD;  Location: [facility] OR;  Service: Neurosurgery;  Laterality: N/A;  Cervical five-six, six-seven Anterior Cervical Decompression Fusion   CATARACT EXTRACTION     FEMUR IM NAIL Left 08/16/2021   Procedure: INTRAMEDULLARY (IM) NAIL FEMORAL;  Surgeon: Cleotilde Barrio, MD;  Location: [facility] ORS;  Service: Orthopedics;  Laterality: Left;   IR IVC FILTER PLMT / S&I /IMG GUID/MOD SED  12/08/2017   Patient Active Problem List   Diagnosis Date Noted   Age-related osteoporosis without current pathological fracture 07/27/2022   Mild recurrent major depression (HCC) 07/27/2022   Hypertensive heart disease with other congestive heart failure (HCC) 02/14/2022   Chronic indwelling Foley catheter 02/14/2022   Closed hip fracture (HCC) 08/15/2021   DVT (deep venous thrombosis) (HCC) 08/15/2021    Quadriplegia (HCC) 08/15/2021   Fall 08/15/2021   Constipation due to slow transit 08/31/2018   Trauma 06/05/2018   Neuropathic pain 06/05/2018   Neurogenic bowel 06/05/2018   Vaginal yeast infection 01/30/2018   Healthcare-associated pneumonia 01/25/2018   Chronic allergic rhinitis 01/24/2018   Depression with anxiety 01/24/2018   UTI due to Klebsiella species 01/24/2018   Protein-calorie malnutrition, severe (HCC) 01/24/2018   S/P insertion of IVC (inferior vena caval) filter 01/24/2018   Tetraparesis (HCC) 01/20/2018   Neurogenic bladder 01/20/2018   Reactive depression    Benign essential HTN    Acute postoperative anemia due to expected blood loss    Central cord syndrome at C6 level of cervical spinal cord (HCC) 11/29/2017   Allergy to alpha-gal 11/25/2016   SCC (squamous cell carcinoma) 04/08/2014   ONSET DATE: 11/29/2017  REFERRING DIAG: Central Cord Syndrome at C6 of the Cervical Spinal Cord, Fall with Bilateral Closed Hip Fractures, with ORIF repair with Intramedullary Nailing of the Left Hip Fracture.   THERAPY DIAG:  Muscle weakness (generalized)  Other lack of coordination  Rationale for Evaluation and Treatment: Rehabilitation  SUBJECTIVE:  SUBJECTIVE STATEMENT:  Patient reports having had elevated blood pressure this morning.  Pt accompanied by: self, Personal Care aide  PERTINENT HISTORY: Central Cord Syndrome at C6 of the Cervical Spinal Cord, Fall with Bilateral Closed Hip Fractures, with ORIF repair with Intramedullary Nailing of the Left Hip Fracture.   PRECAUTIONS: None  WEIGHT  BEARING RESTRICTIONS: No  PAIN:  Are you having pain? No pain   LIVING ENVIRONMENT: Lives with: [facility] Stairs: No Has following equipment at home: Wheelchair (power)  PLOF: Independent   PATIENT GOALS:  See below for established goals  OBJECTIVE:  Note: Objective measures were completed at Evaluation unless otherwise noted.  HAND  DOMINANCE: Right  ADLs: Caregiver assist with ADLs, and [facility]  MOBILITY STATUS: Uses a power w/c  ACTIVITY TOLERANCE: Activity tolerance: Fair  FUNCTIONAL OUTCOME MEASURES:  Measurements:   12/09/2021:   Shoulder flexion: R: 120(136), L: 138(150) Shoulder abduction: R: 108(120), L: 110(120) Elbow: R: 0-148, L: 0-146 Wrist flexion: R: 55(62), L: 78 Writ extension: R: 34(55), L: 3(4) RD: R: 12(20), L: 16(26) UD: R: 8(24), L: 6(18) Thumb radial abduction: R: 11(20), L: 8(20) Digit flexion to the [facility]: R:  2nd: 5cm(3.5cm), 3rd: 7.5 cm(5cm), 4th: 7cm(3cm), 5th: 6cm(5.5cm) L: 2nd: 8cm(8cm), 3rd: 8cm(8cm), 4th: 7.5cm(7cm), 5th: 7cm(7cm)   02/03/2022:   Shoulder flexion: R: 122(138), L: 148(154) Shoulder abduction: R: 109(125), L: 125(125 Elbow: R: 0-148, L: 0-146 Wrist flexion: R: 60(68), L: 79 Writ extension: R: 40(55), L: 3(10) RD: R: 14(24), L: 20(28) UD: R: 8(24), L: 6(18) Thumb radial abduction: R: 20(25), L: 8(20) Digit flexion to the [facility]: R:  2nd: 4.5cm(3cm), 3rd: 6.5cm(4.5cm), 4th: 6.5 cm(3cm), 5th: 4.5cm(5cm) L: 2nd: 8cm(8cm), 3rd: 8cm(7cm), 4th: 7.5cm(7cm), 5th: 6.5cm(6.5cm)   04/07/2022:   Shoulder flexion: R: 125(150), L: 160(160) Shoulder abduction: R: 109(125), L: 125(125 Elbow: R: 0-148, L: 0-146 Wrist flexion: R: 60(68), L: 79 Writ extension: R: 40(55), L: 5(18) RD: R: 14(24), L: 20(28) UD: R: 18(24), L: 8(18) Thumb radial abduction: R: 20(25), L: 8(20) Digit flexion to the [facility]: R:  2nd: 4.5 cm(4 cm), 3rd: 6.5cm(3cm), 4th: 7 cm (5cm), 5th: 5cm(5cm) L: 2nd: 8cm(8cm), 3rd: 8cm(7cm), 4th: 7.5cm(7cm), 5th: 7cm(7cm)   07/05/2022:   Shoulder flexion: R: 125(154), L: 160(160) Shoulder abduction: R: 95(130), L: 143(143) Elbow: R: 0-148, L: 0-146 Wrist flexion: R: 60(68), L: 79 Writ extension: R: 40(60), L: 0(12) RD: R: 10(24), L: 12(20) UD: R: 16(24), L: 8(16) Thumb radial abduction: R: 20(25), L: 8(20) Digit flexion to the [facility]: R:  2nd: 4  cm(4cm), 3rd: 6.5cm( 3.5cm), 4th: 7 cm (5cm), 5th: 6cm(5cm) L: 2nd: 8cm(8cm), 3rd: 8cm(7cm), 4th: 8cm(7cm), 5th: 7cm(7cm)   09/13/2022:   Shoulder flexion: R: 125(154), L: 160(160) Shoulder abduction: R: 100(130), L: 143(143) Elbow: R: 0-150, L: 0-150 Wrist flexion: R: 55(68), L: 79 Writ extension: R: 45(60), L: 0(12) RD: R: 10(24), L: 12(20) UD: R: 16(24), L: 8(16) Thumb radial abduction: R: 20(25), L: 8(20) Digit flexion to the [facility]: R:  2nd: 4 cm(4cm), 3rd: 6cm( 5cm), 4th: 6 cm (4cm), 5th: 5cm(5cm) L: 2nd: 7cm(cm), 3rd: 7cm(6cm), 4th: 7cm(6cm), 5th: 6cm(6cm)   10/27/2022   Shoulder flexion: R: 128(154), L: 160(160) Shoulder abduction: R: 105(130), L: 143(143) Elbow: R: 0-150, L: 0-150 Wrist flexion: R: 55(68), L: 79 Writ extension: R: 46(64), L: 0(20) RD: R: 10(20), L: 18(24) UD: R: 18(28), L: 8(20) Thumb radial abduction: R: 30(42), L:20(24) Digit flexion to the [facility]: R:  2nd: 4 cm(3cm), 3rd: 6cm( 6cm), 4th: 6 cm (5cm), 5th: 5cm(5cm) L: 2nd: 8cm(8cm), 3rd: 8cm(6cm), 4th: 8cm(7cm), 5th: 6cm(6cm)  01/19/2023  Shoulder flexion: R: 131(154), L: 160(160) Shoulder abduction: R: 110(130), L: 143(143) Elbow: R: 0-150, L: 0-150 Wrist flexion: R: 55(68), L: 79 Writ extension: R: 46(64), L: 0(20) RD: R: 10(20), L: 18(24) UD: R: 18(28),  L: 14(20) Thumb radial abduction: R: 38(48), L:24(26) Thumb IP flexion: R: 35(50) L: 20(45) Digit flexion to the [facility]: R:  2nd: 3.5 cm(3cm), 3rd: 6cm( 6cm), 4th: 6 cm (5cm), 5th: 5cm(4.5cm) L: 2nd: 8cm(7.5cm), 3rd: 8cm(6cm), 4th: 7.5cm(6cm), 5th: 6cm(4.5cm)  04/04/2023  Shoulder flexion: R: 134(154), L: 160(160) Shoulder abduction: R: 114(130), L: 143(143) Elbow: R: WNL, L: WNL Wrist flexion: R: 55(68), L: 80 Writ extension: R: 46(64), L: -10(10) RD: R: 14(24), L: 20(24) UD: R: 20(30), L: 14(20) Thumb radial abduction: R: 38(48), L:28(30) Thumb IP flexion: R: 40(50) L: 25(45) Digit flexion to the [facility]: R:  2nd: 3.5 cm(3cm), 3rd: 6cm( 5cm),  4th: 6 cm (4cm), 5th: 5cm(5cm) L: 2nd: 8cm(7.5cm), 3rd: 8cm(7cm), 4th: 7.5cm(7cm), 5th: 7cm(6cm)  05/25/2023  Shoulder flexion: R: 134(154), L: 160(160) Shoulder abduction: R: 117(134), L: 143(143) Elbow: R: WNL, L: WNL Wrist flexion: R: 55(68), L: 80 Wrist extension: R: 46(64), L: -8(14) RD: R: 14(24), L: 20(24) UD: R: 20(32), L: 14(22) Thumb radial abduction: R: 38(50), L:28(30) Thumb IP flexion: R: 40(50) L: 30(45) Digit flexion to the [facility]: R:  2nd: 3.5 cm(3cm), 3rd: 6cm( 5cm), 4th: 7 cm (4cm), 5th: 5cm(4cm) L: 2nd: 8cm(7.5cm), 3rd: 8cm(7cm), 4th: 7.5cm(7cm), 5th: 7cm(6cm)  07/13/23  Shoulder flexion: R: 134(154), L: 160(160) Shoulder abduction: R: 122(134), L: 143(143) Elbow: R: WNL, L: WNL Wrist flexion: R: 55(68), L: 80 Wrist extension: R: 50(64), L: -4(18) RD: R: 18(24), L: 24(24) UD: R: 20(32), L: 14(22) Thumb radial abduction: R: 38(50), L:32(30) Thumb IP flexion: R: 42(50) L: 30(45) Digit flexion to the [facility]: R:  2nd: 3.5 cm(3cm), 3rd: 6cm( 5cm), 4th: 7 cm (4cm), 5th: 5cm(4cm) L: 2nd: 8cm(7.5cm), 3rd: 8cm(7cm), 4th: 7.5cm(7cm), 5th: 7cm(6cm)  11/02/23:   Shoulder flexion: R: 136(154), L: 160(160) Shoulder abduction: R: 123(134), L: 143(143) Elbow: R: WNL, L: WNL Wrist flexion: R: 55(68), L: 80 Wrist extension: R: 30(38), L: -16(18) RD: R: 8(18), L: 18(24) UD: R: 12(24), L: 8(22) Thumb radial abduction: R: 28(50), L:28(30) Thumb IP flexion: R: 42(55) L: 10(25) Digit flexion to the [facility]: R:  2nd: 4.5cm(4cm), 3rd: 6cm( 4.5cm), 4th: 6.5cm (5cm), 5th: 5cm(4.5cm) L: 2nd: 8cm(7.5cm), 3rd: 7.5cm(7cm), 4th: 7cm(7cm), 5th: 6cm(6cm)    COORDINATION:   01/19/2023:  9 hole peg test:  Right: 2 pegs placed in 1 min. & 5 sec.  Pt. was able to remove 9 pegs in 17 sec.  Left: 2 pegs placed in 1 min. & 10 sec. Pt. was able to remove 9 pegs in 20 sec.  04/04/23:  Pt. was able to consistently grasp the pegs however each one slipped out of her fingers when attempting to  place them into the Pegboard.  05/25/2023:   Right: 9 pegs placed and removed in 2 min. & 41 sec. Left: Unable to grasp the pegs for a horizontal position.  07/13/23    Right: 9 pegs placed and removed in 2 min. & 11 sec.     11/02/23:                Right 9 Hole Peg Test: 2 min. & 51 sec.   SENSATION: Intact  EDEMA: N/A   COGNITION: Overall cognitive status: WNL   VISION: Subjective report: Wears glasses, No changes in vision.  PERCEPTION: Intact  TODAY'S TREATMENT:  DATE: 12/12/23   There. Ex.:   -PROM with slow prolonged gentle  stretching was performed to the bilateral wrist extension with increased focus on the left wrist extension. PROM for bilateral digit MP, PIP, and DIP flexion, and extension, thumb abduction was performed per Pt. request.         PATIENT EDUCATION: Education details: Bilateral hand  ROM, condition management Person educated: Patient Education method: Explanation, Demonstration, and Verbal cues Education comprehension: verbalized understanding and returned demonstration  HOME EXERCISE PROGRAM: Continue to assess HEP needs, and provide as needed.   GOALS: Goals reviewed with patient? Yes  LONG TERM GOALS: Target date: 01/25/2024  3.  Patient will independently use a reacher to pick up items from various surfaces.  Baseline: 11/02/23: Pt. is now able to consistently pick up small objects from the floor with a reacher. 07/13/2023: Pt. is starting to use a reacher to retrieve items with an adaptation/modification to the trigger. 05/25/2023:  Pt. is now able to initiate grasping a reacher to open and close the reacher with the right hand, however requires the grasp handle to be repositioned  within the hand. Pt. requires work on using/moving the reacher with the right hand through various planes. 04/04/2023: Pt. continues  to work on improving Digit flexion to the [facility] to be able to efficiently handle Adaptive equipment. 01/19/2023: Pt. Is improving with Bilateral digit flexion to the [facility]. Pt. has difficulty firmly holding adaptive devices. 12/06/2022: Pt. continues to present with increased MP, PIP, and DIP digit tightness/stiffness limiting the formulation of bilateral composites fists. 10/27/2022: Pt. presents with increased MP, PIP, and DIP digit tightness/stiffness limiting the formulation of bilateral composites fists during this progress reporting period limiting. Digit flexion to the [facility]: R:  2nd: 4 cm(3cm), 3rd: 6cm( 6cm), 4th: 6 cm (5cm), 5th: 5cm(5cm), L: 2nd: 8cm(8cm), 3rd: 8cm(6cm), 4th: 8cm(7cm), 5th: 6cm(6cm) 09/20/2022: Improving digit flexion to the [facility]. 09/13/22: improved digit flexion to the [facility]. 07/05/2022: Pt. presents with digit MP, PIP, and DIP extensor tightness limiting her ability to securely grip objects in her bilateral hands.  04/07/2022: Pt. Has improved with right 2nd, and 3rd digit flexion towards the [facility] Pt. Continues to have difficulty securely holding and applying deodorant.02/03/2022: Pt. has improved with digit flexion, however, continues to have difficulty securely holding and using deodorant. 12/09/2021: Bilateral hand/digit MP, PIP, and DIP extension tightness limits her ability achieve digit flexion to hold, and apply deodorant. 10/28/2021:  Pt. continues to have difficulty holding the deodorant. 01/12/2021: Pt. presents with limited digit extension. Pt. Is able to initiate holding deodorant, however is unable to hold it while using it  Goal status:  Achieved   4.  Pt. Will improve bilateral wrist extension in preparation for anticipating, and initiating reaching for objects at the table.  Baseline: 11/30/23: Continue 11/02/23: Wrist extension: R: 30(38), L: -16(18) 07/13/23: Wrist extension: R: 50(64), L: -4(18) 05/25/23: Wrist extension: R: 46(64), L: -8(14) 04/04/23: R: 46(64) L:  -10(10)01/19/2023:  R: 46(64) L: 0(20) 12/06/2022: Pt continues to to progress with bilateral wrist extension in preparation fro functional reaching. 10/27/2022: Pt. Is improving with bilateral wrist extension. R: 46(64) L: 0(20) 09/20/2022: limited PROM in the Left wrist extension 2/2 tightness/stiffness. 09/13/22: R: 55(68), L: 0(12) 07/19/2022: Bilateral wrist extension in limited. 07/05/2022: Right: 40(60) Left: 0(12) 04/07/2022: Right: 40(55) Left: 5(18) 02/03/2022: Right: 34(55) Left: 3(4) 12/09/2021: Right  34(55)  Left  3(4) 10/28/2021: Right 22(38), Left 0(15) 09/14/2021:  Right: 17(35), left 2(15)  Goal status: Ongoing  7.  Pt. Will increase bilateral lateral pinch strength by 2 lbs to be able to securely grasp items during ADLs, and IADL tasks.  Baseline: 01/19/2023: Deferred as the Pinch meter is out for calibration.12/06/2022: Pt. Is able to more securely hold objects during ADLs/IADLs 10/27/2022: Pt. Is improving with holding items with her bilateral thumbs. (Pinch meter out for calibration) 09/20/2022: lateral pinch continues to be limited 09/13/22: R 6.5# L 4# 07/19/2022: TBD 07/05/2022: TBD 07/2022: NT-Pinch meter out for calibration. 02/03/2022: Right: 6#, Left: 4# 12/09/2021: Right: 6#, Left: 4#  Goal status: Deferred   9.  Pt. will complete plant care with modified independence.  Baseline:12/06/2022: Pt. Is independent with plant care in her husband's room, however is not able to access her plants due to furniture blocking access to them. 10/27/2022: Pt. Is able to water plants that are closer, and within reach. Pt. continues to have difficulty reaching for thorough plant care. 09/20/2022: Pt. Continues to have difficulty reaching for thorough plant care. 09/13/22: continues to report intermittent difficulty 07/19/2022: Pt. Continues to be able to water, and care for some of her plants. Pt. Has more difficulty with plants that are harder to reach. 07/05/2022: Pt. is able to water, and care for some of her  plants. Pt. Has more difficulty with plants that are harder to reach. 04/07/2022: Pt. is now able to hold a cup and water her plants.02/03/2022: Pt. has difficulty caring for her plants.  Goal status:Achieved   10.  Pt. will demonstrate adaptive techniques to assist with the efficiency of self-dressing, or morning care tasks.  Baseline: 07/13/23:Pt. continues to work on improving UE functioning to be able to efficiently use Adaptive equipment 05/25/2023: Pt. continues to work on improving UE functioning to be able to efficiently use Adaptive equipment.  04/04/2023:  Pt. continues to work on improving UE functioning to be able to efficiently use Adaptive equipment. 01/19/2203: Pt. Is able to donn her jacket in reverse independently. Pt. requires assist from staff, as Pt. Reports staff have decreased time. 12/06/2022: Pt. requires assist from staff, as Pt. Reports staff have decreased time. 10/27/2022: Pt. is able to assist with initiating UE dressing. Pt. requires assist from personal, and staff care aides. 09/20/2022: Pt. continues to require assist form personal, and staff care aides. 09/13/22: MODA dressing, reports she is often rushed 07/19/2022: Pt. continues to require assist with self-dressing/morning care tasks. 07/05/2022: Continue with goal. 04/07/2022: Pt. Continues to require assist with the efficiency of self-dressing, and morning care tasks.02/03/2022: Pt. requires assist from caregivers 2/2 time limitations during morning care.  Goal status:  Deferred- Has caregiver assist and morning routine in place.  11.  Pt. will improve BUE strength to improve ADL, and IADL functioning.  Baseline: 11/30/2023: Continue 11/02/23: flexion L 4-/5, R 4-/5; shoulder abduction L 4-/5, R 4-/5. elbow flexion B 4/5,01/19/2023: L 4/5, R 3+/5  07/13/23: flexion L 4/5, R 4-/5; shoulder abduction L 4+/5, R 4-/5. elbow flexion B 5/5, elbow extension 4+/5 01/19/2023: L 4/5, R 3+/5  05/25/2023: shoulder flexion L 4/5, R 4-/5; shoulder  abduction L 4+/5, R 4-/5. elbow flexion B 5/5, elbow extension 4+/5 01/19/2023: L 4/5, R 3+/5  04/04/2023: shoulder flexion L 4/5, R 3+/5; shoulder abduction L 4+/5, R 3+/5. elbow flexion B 5/5, elbow extension 4/5 01/19/2023: L 4/5, R 3+/5 12/06/2022: Continue 10/27/2022: shoulder flexion L 4/5, R 3+/5; shoulder abduction L 4+/5, R 3+/5. elbow flexion B 5/5, elbow extension 4/5 09/20/2022: Pt. Presents with limited BUE strength 09/13/22: shoulder  flexion L 4/5, R 3/5; elbow flexion B 5/5, elbow extension 4-/5; shoulder abduction L 4+/5, R 3+/5. 07/19/2022: BUE strength continues to be limited. 07/05/2022: shoulder flexion: right 4-/5, abduction: 3+/5, elbow flexion: right: 5/5, left 5/5, extension: right: 4-/5, left 4-/5, wrist extension: right: 3-/5, left: 2-/5  Goal status: Ongoing  12.  Pt. will improve bilateral thumb radial abduction in order to be able to hold the grab bars while standing with PT  Baseline: 11/30/2023: Continue 11/02/23: Thumb radial abduction: R: 28(50), L:28(30) 07/13/23: Pt. Is improving with holding the grab bars with the right hand .05/25/23: Thumb radial abduction: R: 38(50), L:28(30) 04/04/23: R: 38(48), L:28(30) 01/19/2023: R: 38(48) L: 24(26)12/06/2022: Pt. Continues to present  with limited thumb abduction, however is improving holding onto the parallel bars.10/27/2022: thumb radial abduction: R: 30(42), L: 20(24  Goal status: Ongoing  13.  Pt. Will be able to securely hold, and stabilize medication bottles at a tabletop surface when opening, and closing them.  Baseline: 01/19/2023: Pt. Is now able to securely hold medication bottles while opening them, and stabilizes them on surfaces while opening them. 12/06/2022: Pt is improving with securely stabilizing medication bottles. 10/27/2022: Pt. Stabilizes bottles against her torso when attempting to open, and close them   Goal status: Achieved  14: Pt. Will improve bilateral thumb IP flexion to improve active grasping  patterns.    Baseline: 11/30/2023: Continue 11/02/23: Thumb IP flexion: R: 42(55) L: 10(25) 07/13/23: Thumb IP flexion: R: 42(50) L: 30(45) 05/25/2023: Thumb IP flexion: R: 40(50) L: 30(45) 04/04/2023:  R: 40(50) L: 25(45) 01/19/2023: Right 35(50), Left: 20/45)    Goal status: Ongoing    15. Pt. Will improve bilateral FMC/speed, and dexterity. As evidence by improved scores on the 9 hole peg test. Baseline: 11/30/2023: Limited bilateral fine motor coordination;continue 11/02/23: 2 min. & 51 sec. 07/13/2023: Right: 9 pegs placed and removed in 2 min. & 11 sec. 05/25/2023: 9 Hole pegs test speed/dexterity: Right: 9 pegs placed and removed in 2 min. & 41 sec. Left: Unable to grasp the pegs for a horizontal position. 04/04/23: Pt. was able to consistently grasp the pegs however each one slipped out of her fingers when attempting to place them into the Pegboard. 01/19/2023: 2 pegs placed in 1 min. & 5 sec.  Pt. was able to remove 9 pegs in 17 sec.; Left: 2 pegs placed in 1 min. & 10 sec. Pt. was able to remove 9 pegs in 20 sec.   Goal status: Ongoing    16. Pt. Will donn/doff a jacket with modified independence   Baseline: 11/30/2023: Continue 11/02/23: MaxA   Goal status: Ongoing     ASSESSMENT:  CLINICAL IMPRESSION:  Upon arrival, Patient reports that her blood pressure was elevated this morning at [facility] prior to coming to therapy. During therapy, Pt.'s BP in upright sitting: 81/53 HR 70 bpms,  Reclined: 90/51 HR 70 bpms Pt. is asymptomatic. Pt. Tolerated ROM to the bilateral wrist, and hands inconjunction with moist heat. Pt. Continues to present with increased tightness in the bilateral hands. Pt. continues to benefit from OT services to work on impoving ROM, and UE strength in order to work towards increasing bilateral hand grasp on objects, and increasing engagement of bilateral hands during ADLs, and IADL tasks    PERFORMANCE DE PERFORMANCE DEFICITS: in functional skills including ADLs, IADLs,  coordination, dexterity, ROM, strength, and UE functional use, cognitive skills including , and psychosocial skills including coping strategies and environmental adaptation.   IMPAIRMENTS:  are limiting patient from ADLs, IADLs, and leisure.   CO-MORBIDITIES: may have co-morbidities  that affects occupational performance. Patient will benefit from skilled OT to address above impairments and improve overall function.  MODIFICATION OR ASSISTANCE TO COMPLETE EVALUATION: Min-Moderate modification of tasks or assist with assess necessary to complete an evaluation.  OT OCCUPATIONAL PROFILE AND HISTORY: Detailed assessment: Review of records and additional review of physical, cognitive, psychosocial history related to current functional performance.  CLINICAL DECISION MAKING: Moderate - several treatment options, min-mod task modification necessary  REHAB POTENTIAL: Good for stated goals  PLAN:  OT FREQUENCY 2x's a week  OT DURATION: 12 weeks  PLANNED INTERVENTIONS ADL training, A/E training, UE ther. Ex, Manual therapy, neuromuscular re-education, moist heat modality, Paraffin Bath, Splinting, and  Pt./caregiver education   RECOMMENDED OTHER SERVICES: PT  CONSULTED AND AGREED WITH PLAN OF CARE: Patient  PLAN FOR NEXT SESSION: Continue Treatment as per established POC  Richardson Otter, MS, OTR/L  12/12/23, 4:10 PM

## 2023-12-14 ENCOUNTER — Non-Acute Institutional Stay (SKILLED_NURSING_FACILITY): Payer: Self-pay | Admitting: Orthopedic Surgery

## 2023-12-14 ENCOUNTER — Encounter: Payer: Self-pay | Admitting: Orthopedic Surgery

## 2023-12-14 ENCOUNTER — Ambulatory Visit: Admitting: Occupational Therapy

## 2023-12-14 ENCOUNTER — Ambulatory Visit

## 2023-12-14 DIAGNOSIS — S82202A Unspecified fracture of shaft of left tibia, initial encounter for closed fracture: Secondary | ICD-10-CM | POA: Diagnosis not present

## 2023-12-14 DIAGNOSIS — Z86718 Personal history of other venous thrombosis and embolism: Secondary | ICD-10-CM

## 2023-12-14 DIAGNOSIS — I5089 Other heart failure: Secondary | ICD-10-CM

## 2023-12-14 DIAGNOSIS — I951 Orthostatic hypotension: Secondary | ICD-10-CM

## 2023-12-14 DIAGNOSIS — G8250 Quadriplegia, unspecified: Secondary | ICD-10-CM

## 2023-12-14 DIAGNOSIS — G894 Chronic pain syndrome: Secondary | ICD-10-CM

## 2023-12-14 DIAGNOSIS — N319 Neuromuscular dysfunction of bladder, unspecified: Secondary | ICD-10-CM

## 2023-12-14 DIAGNOSIS — M810 Age-related osteoporosis without current pathological fracture: Secondary | ICD-10-CM

## 2023-12-14 DIAGNOSIS — I110 Hypertensive heart disease with heart failure: Secondary | ICD-10-CM | POA: Diagnosis not present

## 2023-12-14 DIAGNOSIS — F329 Major depressive disorder, single episode, unspecified: Secondary | ICD-10-CM

## 2023-12-14 DIAGNOSIS — D509 Iron deficiency anemia, unspecified: Secondary | ICD-10-CM

## 2023-12-14 NOTE — Progress Notes (Unsigned)
 Location:  Other Nursing Home Room Number: [facility] Place of Service:  SNF [phone]) Provider:  Camellia Door, DO  Door Camellia, DO  Patient Care Team: Door Camellia, DO as PCP - General (Internal Medicine) Caro Harlene POUR, NP as Nurse Practitioner (Geriatric Medicine)  Extended Emergency Contact Information Primary Emergency Contact: [name],[name] Mobile Phone: [phone] Relation: Daughter Secondary Emergency Contact: Mathias Lynwood HERO Address: [address]          [place], [place] [address] [place] Home Phone: [phone] Mobile Phone: [phone] Relation: Spouse  Code Status:  DNR Goals of care: Advanced Directive information    11/17/2023   12:03 PM  Advanced Directives  Does Patient Have a Medical Advance Directive? Yes  Type of Estate Agent of [place];Living will;Out of facility DNR (pink MOST or yellow form)  Does patient want to make changes to medical advance directive? No - Patient declined  Copy of Healthcare Power of Attorney in Chart? Yes - validated most recent copy scanned in chart (See row information)     Chief Complaint  Patient presents with   Medical Management of Chronic Issues    Medical Management of Chronic Issues    HPI:  Pt is a 89 y.o. female seen today for medical management of chronic diseases.    She currently resides on the skilled unit at [facility]. PMH: HTN, DVT, constipation, anemia, depression, chronic pain, malnutrition and neurogenic bladder.   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, remains on skilled nursing Orthostatic hypotension- remains on midodrine  CHF- BUN/creat 10/0.5 10/06/2023, no recent echo,  remains on furosemide Neurogenic bladder- has chronic indwelling foley Osteoporosis- 11/12 tibia fracture, remains on Fosamax, calcium /vitamin D  History of DVT- h/o BLE DVT, remains on Xarelto  Anemia- hgb 12.6 09/2023, remains in ferrous sulfate  Depression- husband recently passed, remains on Cymbalta  Chronic pain- remains on Cymbalta        Past Medical History:  Diagnosis Date   Acute blood loss anemia    Arthritis    Cancer (HCC)    skin   Central cord syndrome at C6 level of cervical spinal cord (HCC) 11/29/2017   Hypertension    Protein-calorie malnutrition, severe 01/24/2018   S/P insertion of IVC (inferior vena caval) filter 01/24/2018   Tetraparesis (HCC)    Past Surgical History:  Procedure Laterality Date   ANTERIOR CERVICAL DECOMP/DISCECTOMY FUSION N/A 11/29/2017   Procedure: Cervical five-six, six-seven Anterior Cervical Decompression Fusion;  Surgeon: Cheryle Debby LABOR, MD;  Location: [facility] OR;  Service: Neurosurgery;  Laterality: N/A;  Cervical five-six, six-seven Anterior Cervical Decompression Fusion   CATARACT EXTRACTION     FEMUR IM NAIL Left 08/16/2021   Procedure: INTRAMEDULLARY (IM) NAIL FEMORAL;  Surgeon: Cleotilde Barrio, MD;  Location: [facility] ORS;  Service: Orthopedics;  Laterality: Left;   IR IVC FILTER PLMT / S&I /IMG GUID/MOD SED  12/08/2017    Allergies  Allergen Reactions   Other Swelling   Lisinopril-Hydrochlorothiazide Swelling    Swelling of the tongue   Bovine (Beef) Protein-Containing Drug Products Swelling    Facial and lip swelling (due to tick bite)   Dairy Aid [Tilactase] Swelling   Lisinopril-Hydrochlorothiazide Swelling    Tongue swelling   Porcine (Pork) Protein-Containing Drug Products  Swelling    Outpatient Encounter Medications as of 12/14/2023  Medication Sig   acetaminophen  (TYLENOL ) 500 MG tablet Take 1,000 mg by mouth 3 (three) times daily.   acetic acid 0.25 % irrigation Use 30ml via irrigation twice daily to keep foley Cath  patent.   alendronate (FOSAMAX) 70 MG tablet Take 70 mg by mouth every Saturday.   calcium  carbonate (CALCIUM  600) 600 MG TABS tablet Take 600 mg by mouth at bedtime.   Capsaicin  0.1 % CREA Apply 1 application  topically every evening. (Apply to hands)   cetirizine (ZYRTEC) 5 MG tablet Take 5 mg by mouth daily.   cholecalciferol  (VITAMIN D3) 25 MCG (1000 UNIT) tablet Take 1,000 Units by mouth daily.   DULoxetine  (CYMBALTA ) 60 MG capsule Take 60 mg by mouth daily.   ferrous sulfate  325 (65 FE) MG tablet Take 325 mg by mouth daily with breakfast.   fluocinonide (LIDEX) 0.05 % external solution Apply 1 Application topically every 12 (twelve) hours as needed.   furosemide (LASIX) 20 MG tablet Take 20 mg by mouth daily.   gabapentin  (NEURONTIN ) 100 MG capsule Take 1 capsule (100 mg total) by mouth at bedtime.   hydroxypropyl methylcellulose / hypromellose (ISOPTO TEARS / GONIOVISC) 2.5 % ophthalmic solution Place 1 drop into both eyes every hour as needed for dry eyes.   ketoconazole (NIZORAL) 2 % shampoo Apply 1 Application topically as directed. Every shift on Thursday for seborrheic dermatitis flare up   lactulose , encephalopathy, (CHRONULAC ) 10 GM/15ML SOLN Take 30 mLs by mouth daily as needed (mild constipation).   magnesium  citrate solution Take 60-120 mLs by mouth daily as needed (constipation).   Menthol , Topical Analgesic, (BIOFREEZE COOL THE PAIN) 4 % GEL Apply 1 application  topically every 8 (eight) hours as needed (for left knee pain).   methylcellulose oral powder Take 1 packet by mouth at bedtime.   midodrine  (PROAMATINE ) 5 MG tablet Take 5 mg by mouth every 8 (eight) hours as needed (for SBP < 90).   Multiple Vitamin (MULTIVITAMIN WITH MINERALS) TABS tablet Take 1 tablet by mouth daily.   olopatadine (PATADAY) 0.1 % ophthalmic solution Place 1 drop into both eyes 2 (two) times daily.   ondansetron  (ZOFRAN ) 4 MG tablet Take 4 mg by mouth every 6 (six) hours as needed for nausea or  vomiting.   polyethylene glycol (MIRALAX  / GLYCOLAX ) 17 g packet Take 17 g by mouth daily as needed. And daily scheduled in the evening   rivaroxaban  (XARELTO ) 10 MG TABS tablet Take 10 mg by mouth daily.   saccharomyces boulardii (FLORASTOR) 250 MG capsule Take 250 mg by mouth daily.   Saline Spray 0.2 % SOLN Place 2 sprays into both nostrils 3 (three) times daily as needed (nasal congestion).   simethicone  (MYLICON) 80 MG chewable tablet Chew 80 mg by mouth every 6 (six) hours as needed for flatulence.   sodium phosphate  (FLEET) 7-19 GM/118ML ENEM Place 1 enema rectally every other day as needed for severe constipation.   tiZANidine  (ZANAFLEX ) 2 MG tablet Take 1 tablet (2 mg total) by mouth 2 (two) times daily.   triamcinolone  cream (KENALOG ) 0.1 % Apply to thigh and lower legs topically every 8 hours as needed. Apply to back of neck topically as needed.   No facility-administered encounter medications on file as of 12/14/2023.    Review of Systems  Constitutional: Negative.   HENT: Negative.  Negative for sore throat and trouble swallowing.   Eyes:  Negative for visual disturbance.  Respiratory:  Negative for cough and shortness of breath.   Cardiovascular:  Negative for chest pain.  Gastrointestinal:  Negative for abdominal distention and abdominal pain.  Genitourinary:  Negative for hematuria.  Musculoskeletal:  Positive for gait problem.  Skin:  Positive for wound.  Neurological:  Positive for weakness. Negative for dizziness and headaches.  Psychiatric/Behavioral:  Positive for dysphoric mood. Negative for sleep disturbance. The patient is not nervous/anxious.     Immunization History  Administered Date(s) Administered   INFLUENZA, HIGH DOSE SEASONAL PF 11/17/2021   Influenza-Unspecified 11/14/2018, 11/20/2019, 11/17/2020, 11/17/2021, 11/24/2022, 11/15/2023   Moderna Covid-19 Fall Seasonal Vaccine 33yrs & older 05/11/2022, 05/13/2023   Moderna Covid-19 Vaccine Bivalent Booster  62yrs & up 06/30/2021, 12/11/2021, 10/29/2022   Moderna Sars-Covid-2 Vaccination 02/09/2019, 03/09/2019, 12/14/2019, 06/20/2020, 10/24/2020   PNEUMOCOCCAL CONJUGATE-20 10/05/2022   PPD Test 05/16/2019   Tdap 11/29/2017   Unspecified SARS-COV-2 Vaccination 11/25/2023   Zoster Recombinant(Shingrix) 09/02/2022, 12/04/2022   Pertinent  Health Maintenance Due  Topic Date Due   Influenza Vaccine  Completed   Mammogram  Discontinued   DEXA SCAN  Discontinued      08/19/2021    8:54 PM 08/20/2021    8:20 AM 03/29/2022   10:11 AM 08/31/2022    2:42 PM 09/20/2023    9:15 AM  Fall Risk  Falls in the past year?   0 0 0  Was there an injury with Fall?   0    Fall Risk Category Calculator   0    (RETIRED) Patient Fall Risk Level High fall risk  High fall risk      Patient at Risk for Falls Due to   No Fall Risks       Data saved with a previous flowsheet row definition   Functional Status Survey:    Vitals:   12/14/23 1124  BP: 102/60  Pulse: 81  Resp: 18  Temp: 97.9 F (36.6 C)  SpO2: 95%  Weight: 196 lb 9.6 oz (89.2 kg)  Height: 5' 7 (1.702 m)   Body mass index is 30.79 kg/m. Physical Exam Vitals reviewed.  Constitutional:      General: She is not in acute distress. HENT:     Head: Normocephalic.  Eyes:     General:        Right eye: No discharge.        Left eye: No discharge.  Cardiovascular:     Rate and Rhythm: Normal rate and regular rhythm.     Pulses: Normal pulses.     Heart sounds: Normal heart sounds.  Pulmonary:     Effort: Pulmonary effort is normal. No respiratory distress.     Breath sounds: Normal breath sounds. No wheezing or rales.  Abdominal:     General: Bowel sounds are normal. There is no distension.     Palpations: Abdomen is soft.     Tenderness: There is no abdominal tenderness.  Genitourinary:    Comments: Foley with UOP Musculoskeletal:     Cervical back: Neck supple.     Right lower leg: Edema present.     Left lower leg: Edema  present.     Comments: Non pitting, AFO brace to left leg, left ankle with mild swelling no bruising, denies pain   Skin:    General: Skin is warm.     Capillary Refill: Capillary refill takes less than 2 seconds.  Neurological:     General: No focal deficit present.     Mental Status: She  is alert and oriented to person, place, and time.     Gait: Gait abnormal.  Psychiatric:        Mood and Affect: Mood normal.     Labs reviewed: Recent Labs    09/14/23 1400 09/19/23 0000 09/29/23 0000 10/06/23 0000  NA 137 140 141 141  K 3.6 4.2 4.5 4.2  CL 99 100 103 101  CO2 27 34* 34* 32*  GLUCOSE 143*  --   --   --   BUN 13 10 9 10   CREATININE 0.44 0.5 0.5 0.5  CALCIUM  8.7* 8.8 9.2 9.2   Recent Labs    05/09/23 0000 08/29/23 0000 09/14/23 1400  AST 21 17 26   ALT 16 12 18   ALKPHOS 59 53 53  BILITOT  --   --  1.0  PROT  --   --  6.6  ALBUMIN  3.6 3.6 3.6   Recent Labs    08/10/23 0000 08/29/23 0000 09/14/23 1400 10/06/23 0000  WBC 6.3 4.9 7.5 5.5  NEUTROABS 4,038.00 2,528.00  --  3,427.00  HGB 11.8* 12.2 12.6 13.0  HCT 36 37 38.0 41  MCV  --   --  94.1  --   PLT 280 305 285 330   Lab Results  Component Value Date   TSH 1.15 08/29/2023   No results found for: HGBA1C No results found for: CHOL, HDL, LDLCALC, LDLDIRECT, TRIG, CHOLHDL  Significant Diagnostic Results in last 30 days:  No results found.  Assessment/Plan 1. Closed fracture of shaft of left tibia, unspecified fracture morphology, initial encounter (Primary) - 11/12 foot twisted in shower - facility xrays confirmed tibia fracture - [facility] eval same day> cont norco and ice  2. Tetraparesis (HCC) - cont skilled nursing care  3. Orthostatic hypotension - stable with midodrine   4. Hypertensive heart disease with other congestive heart failure (HCC) - no recent echo - compensated  - cont furosemide  5. Neurogenic bladder - cont foley and monthly changes  6. Age-related  osteoporosis without current pathological fracture - 11/12 tibia fracture - cont fosamax and calcium /vitamin D   7. History of DVT (deep vein thrombosis) - cont xarelto   8. Iron deficiency anemia, unspecified iron deficiency anemia type - hgb stable - cont ferrous sulfate   9. Reactive depression - associated with tetraplegia - husband recently passed> do not recommend GDR - cont Cymbalta   10. Chronic pain syndrome - cont Cymbalta     Family/ staff Communication: plan discussed with patient and nurse  Labs/tests ordered:  none

## 2023-12-19 ENCOUNTER — Ambulatory Visit: Admitting: Occupational Therapy

## 2023-12-19 ENCOUNTER — Ambulatory Visit

## 2023-12-20 NOTE — Progress Notes (Signed)
 Established Patient Visit   Chief Complaint: Chief Complaint  Patient presents with  . Follow-up    3 month    Date of Service: 12/20/2023 Date of Birth: 01/16/1935 PCP: Abdul Richerd Lapine, MD  History of Present Illness: Ms. Sherry [name] is a 89 y.o.female patient who returns for orthostatic hypotension.  Patient was in her usual state of health until 2019, when she fell on the concrete patio, experience cervical spine fracture with resulting tetraparesis.  Patient has been a resident at [facility].  She has had bilateral lower extremity DVTs, on Xarelto .  The patient had episodes of high blood pressure in the morning, with resultant low blood pressure when placing her motorized wheelchair, associated with lightheadedness.  The patient was on low-dose spironolactone  with history of essential hypertension and fluid retention.  When experiencing low blood pressure, the patient generally rinks Gatorade, eats potato chips, and takes midodrine  prescribed 3 times daily.  She denies chest pain.  She has occasional episodes of exertional dyspnea.  She denies palpitations or heart racing.  She has had worsening peripheral edema off of spironolactone , is given furosemide on as needed basis.  The patient has shown general improvement with upper body movement and strength, though she has been unable to participate in physical therapy due to episodes of low blood pressure, as well as, recent leg fracture.  ECG reveals sinus rhythm at 78 bpm with nonspecific ST abnormalities.   72-hour Holter monitor 08/31/2023 - 09/03/2023 revealed predominant sinus rhythm with mean heart rate 68 bpm, sinus heart rate range 49 to 119 bpm, occasional premature ventricular contractions, and 1 3 beat run of SVT.  2D echocardiogram 09/07/2023 revealed normal left ventricular function, with LVEF > 55%, with mild mitral and tricuspid regurgitation.  Past Medical and Surgical History  Past Medical History Past Medical History:  Diagnosis  Date  . History of skin cancer   . Hypertension     Past Surgical History She has a past surgical history that includes Cataract extraction (Bilateral, 2014).   Medications and Allergies  Current Medications  Current Outpatient Medications  Medication Sig Dispense Refill  . FUROsemide (LASIX) 20 MG tablet Take 20 mg by mouth once daily    . HYDROcodone -acetaminophen  (NORCO) 5-325 mg tablet Take 1 tablet by mouth every 6 (six) hours as needed for Pain    . acetaminophen  (TYLENOL ) 500 mg capsule Take by mouth    . amLODIPine  (NORVASC ) 2.5 MG tablet Take 2.5 mg by mouth once daily (Patient not taking: Reported on 08/31/2023)    . ASCORBATE CALCIUM  (VITAMIN C ORAL) Take by mouth. (Patient not taking: Reported on 08/31/2023)    . cetirizine (ZYRTEC) 10 mg capsule Take 1 capsule (10 mg total) by mouth once daily. 30 capsule 0  . cholecalciferol  (CHOLECALCIFEROL ) 1,000 unit tablet Take 1,000 Units by mouth once daily. Reported on 05/10/2015     . DULoxetine  (CYMBALTA ) 60 MG DR capsule     . ferrous sulfate  325 (65 FE) MG tablet Take 325 mg by mouth daily with breakfast    . fluocinonide (LIDEX) 0.05 % cream Apply topically 2 (two) times daily    . gabapentin  (NEURONTIN ) 100 MG capsule Take 100 mg by mouth once daily    . GLUC SU 2KCL/CHONDR/VIT C/MANG (GLUCOSAM-CHONDROITIN-VIT C-MN ORAL) Take by mouth.    SABRA ketoconazole (NIZORAL) 2 % cream Apply topically once daily    . magnesium  citrate oral solution Take 148 mLs by mouth once    . metroNIDAZOLE  (METROGEL ) 1 %  gel     . midodrine  (PROAMATINE ) 10 MG tablet Take 10 mg by mouth 3 (three) times daily    . multivitamin tablet Take 1 tablet by mouth once daily    . naproxen sodium (ALEVE, ANAPROX) 220 MG tablet Take 220 mg by mouth as needed for Pain    . NORITATE  1 % cream APP EXT TO FACE D  6  . polyethylene glycol (MIRALAX ) packet Take 17 g by mouth once daily Mix in 4-8ounces of fluid prior to taking.    . rivaroxaban  (XARELTO ) 10 mg tablet  Take by mouth    . Saccharomyces boulardii (FLORASTOR) 250 mg capsule Take 250 mg by mouth 2 (two) times daily    . spironolactone  (ALDACTONE ) 25 MG tablet Take 0.5 tablets (12.5 mg total) by mouth once daily 15 tablet 11  . tiZANidine  (ZANAFLEX ) 2 MG tablet Take 2 mg by mouth 3 (three) times daily    . triamcinolone  0.1 % cream Apply topically 2 (two) times daily.     No current facility-administered medications for this visit.    Allergies: Beef containing products, Lisinopril-hydrochlorothiazide, and Lactase  Social and Family History  Social History  reports that she has never smoked. She has never used smokeless tobacco. She reports that she does not drink alcohol  and does not use drugs.  Family History Family History  Problem Relation Name Age of Onset  . High blood pressure (Hypertension) Mother    . Colon cancer Father    . Skin cancer Father    . Lung cancer Father    . Breast cancer Maternal Aunt      Review of Systems   Review of Systems: The patient denies chest pain, shortness of breath, orthopnea, paroxysmal nocturnal dyspnea, pedal edema, palpitations, heart racing, presyncope, syncope. Review of 12 Systems is negative except as described above.  Physical Examination   Vitals:BP 132/80   Pulse 71   Ht 172.7 cm (5' 8)   Wt 90.4 kg (199 lb 3.2 oz)   SpO2 95%   BMI 30.29 kg/m  Ht:172.7 cm (5' 8) Wt:90.4 kg (199 lb 3.2 oz) ADJ:Anib surface area is 2.08 meters squared. Body mass index is 30.29 kg/m.  HEENT: Pupils equally reactive to light and accomodation  Neck: Supple without thyromegaly, carotid pulses 2+ Lungs: clear to auscultation bilaterally; no wheezes, rales, rhonchi Heart: Regular rate and rhythm.  No gallops, murmurs or rub Abdomen: soft nontender, nondistended, with normal bowel sounds Extremities: no cyanosis, clubbing, or edema Peripheral Pulses: 2+ in all extremities, 2+ femoral pulses bilaterally  Assessment   89 y.o. female with  1.  Deep vein thrombosis (DVT) of proximal vein of both lower extremities, unspecified chronicity (CMS/HHS-HCC)   2. Essential hypertension   3. Pedal edema    89 year old female with unfortunate cervical spine fracture resulting in tetraparesis, wheelchair-bound, currently resides at [facility], with more recent episodes of postural low blood pressure, treated with midodrine , Gatorade and potato chips, alternating with high blood pressure.  The patient has had more persistent peripheral edema, off of spironolactone , occasionally takes furosemide on as needed basis.  Blood pressure today in normal range.  ECG revealed sinus rhythm at 78 bpm with nonspecific ST abnormalities.   Plan    1.  Continue current medications 2.  Continue midodrine  as prescribed 3.  Instructed patient to keep blood pressure diary 4.  Resume spironolactone  12.5 mg daily 5.  Return to clinic for follow-up in 3 months    No orders  of the defined types were placed in this encounter.   Return in about 3 months (around 03/21/2024).  MARSA DOOMS, MD PhD [facility]

## 2023-12-21 ENCOUNTER — Ambulatory Visit

## 2023-12-21 ENCOUNTER — Ambulatory Visit: Admitting: Occupational Therapy

## 2023-12-22 ENCOUNTER — Other Ambulatory Visit: Payer: Self-pay | Admitting: Nurse Practitioner

## 2023-12-22 MED ORDER — HYDROCODONE-ACETAMINOPHEN 5-325 MG PO TABS: 1.0000 | ORAL_TABLET | Freq: Four times a day (QID) | ORAL | 0 refills | Status: DC | PRN

## 2023-12-26 ENCOUNTER — Ambulatory Visit

## 2023-12-26 ENCOUNTER — Ambulatory Visit: Admitting: Occupational Therapy

## 2023-12-26 DIAGNOSIS — M6281 Muscle weakness (generalized): Secondary | ICD-10-CM | POA: Diagnosis not present

## 2023-12-26 NOTE — Therapy (Signed)
 Occupational Therapy Neuro Treatment Note  Patient Name: Sherry [name] MRN: [id number] DOB:May 28, 1934, 89 y.o., female Today's Date: 11/29/2022  PCP:  [facility] PROVIDER: Abdul Fine, MD  END OF SESSION:   OT End of Session - 12/26/23 1154     Visit Number 143    Number of Visits 192    Date for Recertification  01/25/24    OT Start Time 1108    OT Stop Time 1146    OT Time Calculation (min) 38 min    Equipment Utilized During Treatment powered wheelchair    Activity Tolerance Patient tolerated treatment well    Behavior During Therapy WFL for tasks assessed/performed              Past Medical History:  Diagnosis Date   Acute blood loss anemia    Arthritis    Cancer (HCC)    skin   Central cord syndrome at C6 level of cervical spinal cord (HCC) 11/29/2017   Hypertension    Protein-calorie malnutrition, severe (HCC) 01/24/2018   S/P insertion of IVC (inferior vena caval) filter 01/24/2018   Tetraparesis (HCC)    Past Surgical History:  Procedure Laterality Date   ANTERIOR CERVICAL DECOMP/DISCECTOMY FUSION N/A 11/29/2017   Procedure: Cervical five-six, six-seven Anterior Cervical Decompression Fusion;  Surgeon: Cheryle Debby DELENA, MD;  Location: [facility] OR;  Service: Neurosurgery;  Laterality: N/A;  Cervical five-six, six-seven Anterior Cervical Decompression Fusion   CATARACT EXTRACTION     FEMUR IM NAIL Left 08/16/2021   Procedure: INTRAMEDULLARY (IM) NAIL FEMORAL;  Surgeon: Cleotilde Barrio, MD;  Location: [facility] ORS;  Service: Orthopedics;  Laterality: Left;   IR IVC FILTER PLMT / S&I /IMG GUID/MOD SED  12/08/2017   Patient Active Problem List   Diagnosis Date Noted   Age-related osteoporosis without current pathological fracture 07/27/2022   Mild recurrent major depression (HCC) 07/27/2022   Hypertensive heart disease with other congestive heart failure (HCC) 02/14/2022   Chronic indwelling Foley catheter 02/14/2022   Closed hip fracture (HCC) 08/15/2021    DVT (deep venous thrombosis) (HCC) 08/15/2021   Quadriplegia (HCC) 08/15/2021   Fall 08/15/2021   Constipation due to slow transit 08/31/2018   Trauma 06/05/2018   Neuropathic pain 06/05/2018   Neurogenic bowel 06/05/2018   Vaginal yeast infection 01/30/2018   Healthcare-associated pneumonia 01/25/2018   Chronic allergic rhinitis 01/24/2018   Depression with anxiety 01/24/2018   UTI due to Klebsiella species 01/24/2018   Protein-calorie malnutrition, severe (HCC) 01/24/2018   S/P insertion of IVC (inferior vena caval) filter 01/24/2018   Tetraparesis (HCC) 01/20/2018   Neurogenic bladder 01/20/2018   Reactive depression    Benign essential HTN    Acute postoperative anemia due to expected blood loss    Central cord syndrome at C6 level of cervical spinal cord (HCC) 11/29/2017   Allergy to alpha-gal 11/25/2016   SCC (squamous cell carcinoma) 04/08/2014   ONSET DATE: 11/29/2017  REFERRING DIAG: Central Cord Syndrome at C6 of the Cervical Spinal Cord, Fall with Bilateral Closed Hip Fractures, with ORIF repair with Intramedullary Nailing of the Left Hip Fracture.   THERAPY DIAG:  Muscle weakness (generalized)  Other lack of coordination  Rationale for Evaluation and Treatment: Rehabilitation  SUBJECTIVE:  SUBJECTIVE STATEMENT:  Patient reports that she ate some salt prior to coming here today to help her BP.  Pt accompanied by: self, Personal Care aide  PERTINENT HISTORY: Central Cord Syndrome at C6 of the Cervical Spinal Cord, Fall with Bilateral Closed Hip Fractures, with  ORIF repair with Intramedullary Nailing of the Left Hip Fracture.   PRECAUTIONS: None  WEIGHT BEARING RESTRICTIONS: No  PAIN:  Are you having pain? No pain   LIVING ENVIRONMENT: Lives with: [facility] Stairs: No Has following equipment at home: Wheelchair (power)  PLOF: Independent   PATIENT GOALS:  See below for established goals  OBJECTIVE:  Note: Objective measures  were completed at Evaluation unless otherwise noted.  HAND DOMINANCE: Right  ADLs: Caregiver assist with ADLs, and [facility]  MOBILITY STATUS: Uses a power w/c  ACTIVITY TOLERANCE: Activity tolerance: Fair  FUNCTIONAL OUTCOME MEASURES:  Measurements:   12/09/2021:   Shoulder flexion: R: 120(136), L: 138(150) Shoulder abduction: R: 108(120), L: 110(120) Elbow: R: 0-148, L: 0-146 Wrist flexion: R: 55(62), L: 78 Writ extension: R: 34(55), L: 3(4) RD: R: 12(20), L: 16(26) UD: R: 8(24), L: 6(18) Thumb radial abduction: R: 11(20), L: 8(20) Digit flexion to the [facility]: R:  2nd: 5cm(3.5cm), 3rd: 7.5 cm(5cm), 4th: 7cm(3cm), 5th: 6cm(5.5cm) L: 2nd: 8cm(8cm), 3rd: 8cm(8cm), 4th: 7.5cm(7cm), 5th: 7cm(7cm)   02/03/2022:   Shoulder flexion: R: 122(138), L: 148(154) Shoulder abduction: R: 109(125), L: 125(125 Elbow: R: 0-148, L: 0-146 Wrist flexion: R: 60(68), L: 79 Writ extension: R: 40(55), L: 3(10) RD: R: 14(24), L: 20(28) UD: R: 8(24), L: 6(18) Thumb radial abduction: R: 20(25), L: 8(20) Digit flexion to the [facility]: R:  2nd: 4.5cm(3cm), 3rd: 6.5cm(4.5cm), 4th: 6.5 cm(3cm), 5th: 4.5cm(5cm) L: 2nd: 8cm(8cm), 3rd: 8cm(7cm), 4th: 7.5cm(7cm), 5th: 6.5cm(6.5cm)   04/07/2022:   Shoulder flexion: R: 125(150), L: 160(160) Shoulder abduction: R: 109(125), L: 125(125 Elbow: R: 0-148, L: 0-146 Wrist flexion: R: 60(68), L: 79 Writ extension: R: 40(55), L: 5(18) RD: R: 14(24), L: 20(28) UD: R: 18(24), L: 8(18) Thumb radial abduction: R: 20(25), L: 8(20) Digit flexion to the [facility]: R:  2nd: 4.5 cm(4 cm), 3rd: 6.5cm(3cm), 4th: 7 cm (5cm), 5th: 5cm(5cm) L: 2nd: 8cm(8cm), 3rd: 8cm(7cm), 4th: 7.5cm(7cm), 5th: 7cm(7cm)   07/05/2022:   Shoulder flexion: R: 125(154), L: 160(160) Shoulder abduction: R: 95(130), L: 143(143) Elbow: R: 0-148, L: 0-146 Wrist flexion: R: 60(68), L: 79 Writ extension: R: 40(60), L: 0(12) RD: R: 10(24), L: 12(20) UD: R: 16(24), L: 8(16) Thumb radial abduction: R:  20(25), L: 8(20) Digit flexion to the [facility]: R:  2nd: 4 cm(4cm), 3rd: 6.5cm( 3.5cm), 4th: 7 cm (5cm), 5th: 6cm(5cm) L: 2nd: 8cm(8cm), 3rd: 8cm(7cm), 4th: 8cm(7cm), 5th: 7cm(7cm)   09/13/2022:   Shoulder flexion: R: 125(154), L: 160(160) Shoulder abduction: R: 100(130), L: 143(143) Elbow: R: 0-150, L: 0-150 Wrist flexion: R: 55(68), L: 79 Writ extension: R: 45(60), L: 0(12) RD: R: 10(24), L: 12(20) UD: R: 16(24), L: 8(16) Thumb radial abduction: R: 20(25), L: 8(20) Digit flexion to the [facility]: R:  2nd: 4 cm(4cm), 3rd: 6cm( 5cm), 4th: 6 cm (4cm), 5th: 5cm(5cm) L: 2nd: 7cm(cm), 3rd: 7cm(6cm), 4th: 7cm(6cm), 5th: 6cm(6cm)   10/27/2022   Shoulder flexion: R: 128(154), L: 160(160) Shoulder abduction: R: 105(130), L: 143(143) Elbow: R: 0-150, L: 0-150 Wrist flexion: R: 55(68), L: 79 Writ extension: R: 46(64), L: 0(20) RD: R: 10(20), L: 18(24) UD: R: 18(28), L: 8(20) Thumb radial abduction: R: 30(42), L:20(24) Digit flexion to the [facility]: R:  2nd: 4 cm(3cm), 3rd: 6cm( 6cm), 4th: 6 cm (5cm), 5th: 5cm(5cm) L: 2nd: 8cm(8cm), 3rd: 8cm(6cm), 4th: 8cm(7cm), 5th: 6cm(6cm)  01/19/2023  Shoulder flexion: R: 131(154), L: 160(160) Shoulder abduction: R: 110(130), L: 143(143) Elbow: R: 0-150, L: 0-150 Wrist flexion: R: 55(68),  L: 79 Writ extension: R: 46(64), L: 0(20) RD: R: 10(20), L: 18(24) UD: R: 18(28), L: 14(20) Thumb radial abduction: R: 38(48), L:24(26) Thumb IP flexion: R: 35(50) L: 20(45) Digit flexion to the [facility]: R:  2nd: 3.5 cm(3cm), 3rd: 6cm( 6cm), 4th: 6 cm (5cm), 5th: 5cm(4.5cm) L: 2nd: 8cm(7.5cm), 3rd: 8cm(6cm), 4th: 7.5cm(6cm), 5th: 6cm(4.5cm)  04/04/2023  Shoulder flexion: R: 134(154), L: 160(160) Shoulder abduction: R: 114(130), L: 143(143) Elbow: R: WNL, L: WNL Wrist flexion: R: 55(68), L: 80 Writ extension: R: 46(64), L: -10(10) RD: R: 14(24), L: 20(24) UD: R: 20(30), L: 14(20) Thumb radial abduction: R: 38(48), L:28(30) Thumb IP flexion: R: 40(50) L: 25(45) Digit  flexion to the [facility]: R:  2nd: 3.5 cm(3cm), 3rd: 6cm( 5cm), 4th: 6 cm (4cm), 5th: 5cm(5cm) L: 2nd: 8cm(7.5cm), 3rd: 8cm(7cm), 4th: 7.5cm(7cm), 5th: 7cm(6cm)  05/25/2023  Shoulder flexion: R: 134(154), L: 160(160) Shoulder abduction: R: 117(134), L: 143(143) Elbow: R: WNL, L: WNL Wrist flexion: R: 55(68), L: 80 Wrist extension: R: 46(64), L: -8(14) RD: R: 14(24), L: 20(24) UD: R: 20(32), L: 14(22) Thumb radial abduction: R: 38(50), L:28(30) Thumb IP flexion: R: 40(50) L: 30(45) Digit flexion to the [facility]: R:  2nd: 3.5 cm(3cm), 3rd: 6cm( 5cm), 4th: 7 cm (4cm), 5th: 5cm(4cm) L: 2nd: 8cm(7.5cm), 3rd: 8cm(7cm), 4th: 7.5cm(7cm), 5th: 7cm(6cm)  07/13/23  Shoulder flexion: R: 134(154), L: 160(160) Shoulder abduction: R: 122(134), L: 143(143) Elbow: R: WNL, L: WNL Wrist flexion: R: 55(68), L: 80 Wrist extension: R: 50(64), L: -4(18) RD: R: 18(24), L: 24(24) UD: R: 20(32), L: 14(22) Thumb radial abduction: R: 38(50), L:32(30) Thumb IP flexion: R: 42(50) L: 30(45) Digit flexion to the [facility]: R:  2nd: 3.5 cm(3cm), 3rd: 6cm( 5cm), 4th: 7 cm (4cm), 5th: 5cm(4cm) L: 2nd: 8cm(7.5cm), 3rd: 8cm(7cm), 4th: 7.5cm(7cm), 5th: 7cm(6cm)  11/02/23:   Shoulder flexion: R: 136(154), L: 160(160) Shoulder abduction: R: 123(134), L: 143(143) Elbow: R: WNL, L: WNL Wrist flexion: R: 55(68), L: 80 Wrist extension: R: 30(38), L: -16(18) RD: R: 8(18), L: 18(24) UD: R: 12(24), L: 8(22) Thumb radial abduction: R: 28(50), L:28(30) Thumb IP flexion: R: 42(55) L: 10(25) Digit flexion to the [facility]: R:  2nd: 4.5cm(4cm), 3rd: 6cm( 4.5cm), 4th: 6.5cm (5cm), 5th: 5cm(4.5cm) L: 2nd: 8cm(7.5cm), 3rd: 7.5cm(7cm), 4th: 7cm(7cm), 5th: 6cm(6cm)    COORDINATION:   01/19/2023:  9 hole peg test:  Right: 2 pegs placed in 1 min. & 5 sec.  Pt. was able to remove 9 pegs in 17 sec.  Left: 2 pegs placed in 1 min. & 10 sec. Pt. was able to remove 9 pegs in 20 sec.  04/04/23:  Pt. was able to consistently grasp the pegs however  each one slipped out of her fingers when attempting to place them into the Pegboard.  05/25/2023:   Right: 9 pegs placed and removed in 2 min. & 41 sec. Left: Unable to grasp the pegs for a horizontal position.  07/13/23    Right: 9 pegs placed and removed in 2 min. & 11 sec.     11/02/23:                Right 9 Hole Peg Test: 2 min. & 51 sec.   SENSATION: Intact  EDEMA: N/A   COGNITION: Overall cognitive status: WNL   VISION: Subjective report: Wears glasses, No changes in vision.  PERCEPTION: Intact  TODAY'S TREATMENT:  DATE: 12/26/23  Therapeutic Ex.:   There. Ex.:   -PROM with slow prolonged gentle  stretching was performed to the bilateral wrist extension with increased focus on the left wrist extension. PROM for bilateral digit MP, PIP, and DIP flexion, and extension, thumb abduction was performed per Pt. request.             PATIENT EDUCATION: Education details: Bilateral hand  ROM, condition management, self-care: Condition Management BP Person educated: Patient Education method: Explanation, Demonstration, and Verbal cues Education comprehension: verbalized understanding and returned demonstration  HOME EXERCISE PROGRAM: Continue to assess HEP needs, and provide as needed.   GOALS: Goals reviewed with patient? Yes  LONG TERM GOALS: Target date: 01/25/2024  3.  Patient will independently use a reacher to pick up items from various surfaces.  Baseline: 11/02/23: Pt. is now able to consistently pick up small objects from the floor with a reacher. 07/13/2023: Pt. is starting to use a reacher to retrieve items with an adaptation/modification to the trigger. 05/25/2023:  Pt. is now able to initiate grasping a reacher to open and close the reacher with the right hand, however requires the grasp handle to be repositioned  within the hand.  Pt. requires work on using/moving the reacher with the right hand through various planes. 04/04/2023: Pt. continues to work on improving Digit flexion to the [facility] to be able to efficiently handle Adaptive equipment. 01/19/2023: Pt. Is improving with Bilateral digit flexion to the [facility]. Pt. has difficulty firmly holding adaptive devices. 12/06/2022: Pt. continues to present with increased MP, PIP, and DIP digit tightness/stiffness limiting the formulation of bilateral composites fists. 10/27/2022: Pt. presents with increased MP, PIP, and DIP digit tightness/stiffness limiting the formulation of bilateral composites fists during this progress reporting period limiting. Digit flexion to the [facility]: R:  2nd: 4 cm(3cm), 3rd: 6cm( 6cm), 4th: 6 cm (5cm), 5th: 5cm(5cm), L: 2nd: 8cm(8cm), 3rd: 8cm(6cm), 4th: 8cm(7cm), 5th: 6cm(6cm) 09/20/2022: Improving digit flexion to the [facility]. 09/13/22: improved digit flexion to the [facility]. 07/05/2022: Pt. presents with digit MP, PIP, and DIP extensor tightness limiting her ability to securely grip objects in her bilateral hands.  04/07/2022: Pt. Has improved with right 2nd, and 3rd digit flexion towards the [facility]. Pt. Continues to have difficulty securely holding and applying deodorant.02/03/2022: Pt. has improved with digit flexion, however, continues to have difficulty securely holding and using deodorant. 12/09/2021: Bilateral hand/digit MP, PIP, and DIP extension tightness limits her ability achieve digit flexion to hold, and apply deodorant. 10/28/2021:  Pt. continues to have difficulty holding the deodorant. 01/12/2021: Pt. presents with limited digit extension. Pt. Is able to initiate holding deodorant, however is unable to hold it while using it  Goal status:  Achieved   4.  Pt. Will improve bilateral wrist extension in preparation for anticipating, and initiating reaching for objects at the table.  Baseline: 11/30/23: Continue 11/02/23: Wrist extension: R: 30(38), L: -16(18) 07/13/23: Wrist  extension: R: 50(64), L: -4(18) 05/25/23: Wrist extension: R: 46(64), L: -8(14) 04/04/23: R: 46(64) L: -10(10)01/19/2023:  R: 46(64) L: 0(20) 12/06/2022: Pt continues to to progress with bilateral wrist extension in preparation fro functional reaching. 10/27/2022: Pt. Is improving with bilateral wrist extension. R: 46(64) L: 0(20) 09/20/2022: limited PROM in the Left wrist extension 2/2 tightness/stiffness. 09/13/22: R: 55(68), L: 0(12) 07/19/2022: Bilateral wrist extension in limited. 07/05/2022: Right: 40(60) Left: 0(12) 04/07/2022: Right: 40(55) Left: 5(18) 02/03/2022: Right: 34(55) Left: 3(4) 12/09/2021: Right  34(55)  Left  3(4) 10/28/2021: Right 22(38), Left  0(15) 09/14/2021:  Right: 17(35), left 2(15)  Goal status: Ongoing  7.  Pt. Will increase bilateral lateral pinch strength by 2 lbs to be able to securely grasp items during ADLs, and IADL tasks.  Baseline: 01/19/2023: Deferred as the Pinch meter is out for calibration.12/06/2022: Pt. Is able to more securely hold objects during ADLs/IADLs 10/27/2022: Pt. Is improving with holding items with her bilateral thumbs. (Pinch meter out for calibration) 09/20/2022: lateral pinch continues to be limited 09/13/22: R 6.5# L 4# 07/19/2022: TBD 07/05/2022: TBD 07/2022: NT-Pinch meter out for calibration. 02/03/2022: Right: 6#, Left: 4# 12/09/2021: Right: 6#, Left: 4#  Goal status: Deferred   9.  Pt. will complete plant care with modified independence.  Baseline:12/06/2022: Pt. Is independent with plant care in her husband's room, however is not able to access her plants due to furniture blocking access to them. 10/27/2022: Pt. Is able to water plants that are closer, and within reach. Pt. continues to have difficulty reaching for thorough plant care. 09/20/2022: Pt. Continues to have difficulty reaching for thorough plant care. 09/13/22: continues to report intermittent difficulty 07/19/2022: Pt. Continues to be able to water, and care for some of her plants. Pt. Has more  difficulty with plants that are harder to reach. 07/05/2022: Pt. is able to water, and care for some of her plants. Pt. Has more difficulty with plants that are harder to reach. 04/07/2022: Pt. is now able to hold a cup and water her plants.02/03/2022: Pt. has difficulty caring for her plants.  Goal status:Achieved   10.  Pt. will demonstrate adaptive techniques to assist with the efficiency of self-dressing, or morning care tasks.  Baseline: 07/13/23:Pt. continues to work on improving UE functioning to be able to efficiently use Adaptive equipment 05/25/2023: Pt. continues to work on improving UE functioning to be able to efficiently use Adaptive equipment.  04/04/2023:  Pt. continues to work on improving UE functioning to be able to efficiently use Adaptive equipment. 01/19/2203: Pt. Is able to donn her jacket in reverse independently. Pt. requires assist from staff, as Pt. Reports staff have decreased time. 12/06/2022: Pt. requires assist from staff, as Pt. Reports staff have decreased time. 10/27/2022: Pt. is able to assist with initiating UE dressing. Pt. requires assist from personal, and staff care aides. 09/20/2022: Pt. continues to require assist form personal, and staff care aides. 09/13/22: MODA dressing, reports she is often rushed 07/19/2022: Pt. continues to require assist with self-dressing/morning care tasks. 07/05/2022: Continue with goal. 04/07/2022: Pt. Continues to require assist with the efficiency of self-dressing, and morning care tasks.02/03/2022: Pt. requires assist from caregivers 2/2 time limitations during morning care.  Goal status:  Deferred- Has caregiver assist and morning routine in place.  11.  Pt. will improve BUE strength to improve ADL, and IADL functioning.  Baseline: 11/30/2023: Continue 11/02/23: flexion L 4-/5, R 4-/5; shoulder abduction L 4-/5, R 4-/5. elbow flexion B 4/5,01/19/2023: L 4/5, R 3+/5  07/13/23: flexion L 4/5, R 4-/5; shoulder abduction L 4+/5, R 4-/5. elbow flexion B  5/5, elbow extension 4+/5 01/19/2023: L 4/5, R 3+/5  05/25/2023: shoulder flexion L 4/5, R 4-/5; shoulder abduction L 4+/5, R 4-/5. elbow flexion B 5/5, elbow extension 4+/5 01/19/2023: L 4/5, R 3+/5  04/04/2023: shoulder flexion L 4/5, R 3+/5; shoulder abduction L 4+/5, R 3+/5. elbow flexion B 5/5, elbow extension 4/5 01/19/2023: L 4/5, R 3+/5 12/06/2022: Continue 10/27/2022: shoulder flexion L 4/5, R 3+/5; shoulder abduction L 4+/5, R 3+/5. elbow flexion B 5/5,  elbow extension 4/5 09/20/2022: Pt. Presents with limited BUE strength 09/13/22: shoulder flexion L 4/5, R 3/5; elbow flexion B 5/5, elbow extension 4-/5; shoulder abduction L 4+/5, R 3+/5. 07/19/2022: BUE strength continues to be limited. 07/05/2022: shoulder flexion: right 4-/5, abduction: 3+/5, elbow flexion: right: 5/5, left 5/5, extension: right: 4-/5, left 4-/5, wrist extension: right: 3-/5, left: 2-/5  Goal status: Ongoing  12.  Pt. will improve bilateral thumb radial abduction in order to be able to hold the grab bars while standing with PT  Baseline: 11/30/2023: Continue 11/02/23: Thumb radial abduction: R: 28(50), L:28(30) 07/13/23: Pt. Is improving with holding the grab bars with the right hand .05/25/23: Thumb radial abduction: R: 38(50), L:28(30) 04/04/23: R: 38(48), L:28(30) 01/19/2023: R: 38(48) L: 24(26)12/06/2022: Pt. Continues to present  with limited thumb abduction, however is improving holding onto the parallel bars.10/27/2022: thumb radial abduction: R: 30(42), L: 20(24  Goal status: Ongoing  13.  Pt. Will be able to securely hold, and stabilize medication bottles at a tabletop surface when opening, and closing them.  Baseline: 01/19/2023: Pt. Is now able to securely hold medication bottles while opening them, and stabilizes them on surfaces while opening them. 12/06/2022: Pt is improving with securely stabilizing medication bottles. 10/27/2022: Pt. Stabilizes bottles against her torso when attempting to open, and close them   Goal  status: Achieved  14: Pt. Will improve bilateral thumb IP flexion to improve active grasping patterns.    Baseline: 11/30/2023: Continue 11/02/23: Thumb IP flexion: R: 42(55) L: 10(25) 07/13/23: Thumb IP flexion: R: 42(50) L: 30(45) 05/25/2023: Thumb IP flexion: R: 40(50) L: 30(45) 04/04/2023:  R: 40(50) L: 25(45) 01/19/2023: Right 35(50), Left: 20/45)    Goal status: Ongoing    15. Pt. Will improve bilateral FMC/speed, and dexterity. As evidence by improved scores on the 9 hole peg test. Baseline: 11/30/2023: Limited bilateral fine motor coordination;continue 11/02/23: 2 min. & 51 sec. 07/13/2023: Right: 9 pegs placed and removed in 2 min. & 11 sec. 05/25/2023: 9 Hole pegs test speed/dexterity: Right: 9 pegs placed and removed in 2 min. & 41 sec. Left: Unable to grasp the pegs for a horizontal position. 04/04/23: Pt. was able to consistently grasp the pegs however each one slipped out of her fingers when attempting to place them into the Pegboard. 01/19/2023: 2 pegs placed in 1 min. & 5 sec.  Pt. was able to remove 9 pegs in 17 sec.; Left: 2 pegs placed in 1 min. & 10 sec. Pt. was able to remove 9 pegs in 20 sec.   Goal status: Ongoing    16. Pt. Will donn/doff a jacket with modified independence   Baseline: 11/30/2023: Continue 11/02/23: MaxA   Goal status: Ongoing     ASSESSMENT:  CLINICAL IMPRESSION:  Upon arrival, Pt.'s BP 135/74 with HR 74 bpms.  Pt. has returned for OT services after sustaining a left ankle fx. Pt. has a cast in place.  Pt. reports no pain during the session today. Pt. Tolerated ROM well, however continues to present with tightness.  In the bilateral wrists, and digits. Pt. continues to benefit from OT services to work on impoving ROM, and UE strength in order to work towards increasing bilateral hand grasp on objects, and increasing engagement of bilateral hands during ADLs, and IADL tasks    PERFORMANCE DE PERFORMANCE DEFICITS: in functional skills including ADLs, IADLs,  coordination, dexterity, ROM, strength, and UE functional use, cognitive skills including , and psychosocial skills including coping strategies and environmental adaptation.  IMPAIRMENTS: are limiting patient from ADLs, IADLs, and leisure.   CO-MORBIDITIES: may have co-morbidities  that affects occupational performance. Patient will benefit from skilled OT to address above impairments and improve overall function.  MODIFICATION OR ASSISTANCE TO COMPLETE EVALUATION: Min-Moderate modification of tasks or assist with assess necessary to complete an evaluation.  OT OCCUPATIONAL PROFILE AND HISTORY: Detailed assessment: Review of records and additional review of physical, cognitive, psychosocial history related to current functional performance.  CLINICAL DECISION MAKING: Moderate - several treatment options, min-mod task modification necessary  REHAB POTENTIAL: Good for stated goals  PLAN:  OT FREQUENCY 2x's a week  OT DURATION: 12 weeks  PLANNED INTERVENTIONS ADL training, A/E training, UE ther. Ex, Manual therapy, neuromuscular re-education, moist heat modality, Paraffin Bath, Splinting, and  Pt./caregiver education   RECOMMENDED OTHER SERVICES: PT  CONSULTED AND AGREED WITH PLAN OF CARE: Patient  PLAN FOR NEXT SESSION: Continue Treatment as per established POC  Richardson Otter, MS, OTR/L  12/26/23, 11:57 AM

## 2023-12-28 ENCOUNTER — Ambulatory Visit: Admitting: Occupational Therapy

## 2023-12-28 ENCOUNTER — Ambulatory Visit

## 2023-12-28 DIAGNOSIS — M6281 Muscle weakness (generalized): Secondary | ICD-10-CM | POA: Diagnosis not present

## 2023-12-28 NOTE — Therapy (Signed)
 Occupational Therapy Neuro Treatment Note  Patient Name: Sherry [name] MRN: [id number] DOB:23-Dec-1934, 89 y.o., female Today's Date: 11/29/2022  PCP:  [facility] PROVIDER: Abdul Fine, MD  END OF SESSION:   OT End of Session - 12/28/23 1324     Visit Number 144    Number of Visits 192    Date for Recertification  01/25/24    OT Start Time 1106    OT Stop Time 1145    OT Time Calculation (min) 39 min    Activity Tolerance Patient tolerated treatment well    Behavior During Therapy WFL for tasks assessed/performed              Past Medical History:  Diagnosis Date   Acute blood loss anemia    Arthritis    Cancer (HCC)    skin   Central cord syndrome at C6 level of cervical spinal cord (HCC) 11/29/2017   Hypertension    Protein-calorie malnutrition, severe (HCC) 01/24/2018   S/P insertion of IVC (inferior vena caval) filter 01/24/2018   Tetraparesis (HCC)    Past Surgical History:  Procedure Laterality Date   ANTERIOR CERVICAL DECOMP/DISCECTOMY FUSION N/A 11/29/2017   Procedure: Cervical five-six, six-seven Anterior Cervical Decompression Fusion;  Surgeon: Cheryle Debby DELENA, MD;  Location: [facility] OR;  Service: Neurosurgery;  Laterality: N/A;  Cervical five-six, six-seven Anterior Cervical Decompression Fusion   CATARACT EXTRACTION     FEMUR IM NAIL Left 08/16/2021   Procedure: INTRAMEDULLARY (IM) NAIL FEMORAL;  Surgeon: Cleotilde Barrio, MD;  Location: [facility] ORS;  Service: Orthopedics;  Laterality: Left;   IR IVC FILTER PLMT / S&I /IMG GUID/MOD SED  12/08/2017   Patient Active Problem List   Diagnosis Date Noted   Age-related osteoporosis without current pathological fracture 07/27/2022   Mild recurrent major depression (HCC) 07/27/2022   Hypertensive heart disease with other congestive heart failure (HCC) 02/14/2022   Chronic indwelling Foley catheter 02/14/2022   Closed hip fracture (HCC) 08/15/2021   DVT (deep venous thrombosis) (HCC) 08/15/2021    Quadriplegia (HCC) 08/15/2021   Fall 08/15/2021   Constipation due to slow transit 08/31/2018   Trauma 06/05/2018   Neuropathic pain 06/05/2018   Neurogenic bowel 06/05/2018   Vaginal yeast infection 01/30/2018   Healthcare-associated pneumonia 01/25/2018   Chronic allergic rhinitis 01/24/2018   Depression with anxiety 01/24/2018   UTI due to Klebsiella species 01/24/2018   Protein-calorie malnutrition, severe (HCC) 01/24/2018   S/P insertion of IVC (inferior vena caval) filter 01/24/2018   Tetraparesis (HCC) 01/20/2018   Neurogenic bladder 01/20/2018   Reactive depression    Benign essential HTN    Acute postoperative anemia due to expected blood loss    Central cord syndrome at C6 level of cervical spinal cord (HCC) 11/29/2017   Allergy to alpha-gal 11/25/2016   SCC (squamous cell carcinoma) 04/08/2014   ONSET DATE: 11/29/2017  REFERRING DIAG: Central Cord Syndrome at C6 of the Cervical Spinal Cord, Fall with Bilateral Closed Hip Fractures, with ORIF repair with Intramedullary Nailing of the Left Hip Fracture.   THERAPY DIAG:  Muscle weakness (generalized)  Other lack of coordination  Rationale for Evaluation and Treatment: Rehabilitation  SUBJECTIVE:  SUBJECTIVE STATEMENT:  Patient reports that she ate some salt prior to coming here today to help her BP.  Pt accompanied by: self, Personal Care aide  PERTINENT HISTORY: Central Cord Syndrome at C6 of the Cervical Spinal Cord, Fall with Bilateral Closed Hip Fractures, with ORIF repair with Intramedullary Nailing of the Left Hip  Fracture.   PRECAUTIONS: None  WEIGHT BEARING RESTRICTIONS: No  PAIN:  Are you having pain? No pain   LIVING ENVIRONMENT: Lives with: [facility] Stairs: No Has following equipment at home: Wheelchair (power)  PLOF: Independent   PATIENT GOALS:  See below for established goals  OBJECTIVE:  Note: Objective measures were completed at Evaluation unless otherwise  noted.  HAND DOMINANCE: Right  ADLs: Caregiver assist with ADLs, and [facility]  MOBILITY STATUS: Uses a power w/c  ACTIVITY TOLERANCE: Activity tolerance: Fair  FUNCTIONAL OUTCOME MEASURES:  Measurements:   12/09/2021:   Shoulder flexion: R: 120(136), L: 138(150) Shoulder abduction: R: 108(120), L: 110(120) Elbow: R: 0-148, L: 0-146 Wrist flexion: R: 55(62), L: 78 Writ extension: R: 34(55), L: 3(4) RD: R: 12(20), L: 16(26) UD: R: 8(24), L: 6(18) Thumb radial abduction: R: 11(20), L: 8(20) Digit flexion to the [facility]: R:  2nd: 5cm(3.5cm), 3rd: 7.5 cm(5cm), 4th: 7cm(3cm), 5th: 6cm(5.5cm) L: 2nd: 8cm(8cm), 3rd: 8cm(8cm), 4th: 7.5cm(7cm), 5th: 7cm(7cm)   02/03/2022:   Shoulder flexion: R: 122(138), L: 148(154) Shoulder abduction: R: 109(125), L: 125(125 Elbow: R: 0-148, L: 0-146 Wrist flexion: R: 60(68), L: 79 Writ extension: R: 40(55), L: 3(10) RD: R: 14(24), L: 20(28) UD: R: 8(24), L: 6(18) Thumb radial abduction: R: 20(25), L: 8(20) Digit flexion to the [facility]: R:  2nd: 4.5cm(3cm), 3rd: 6.5cm(4.5cm), 4th: 6.5 cm(3cm), 5th: 4.5cm(5cm) L: 2nd: 8cm(8cm), 3rd: 8cm(7cm), 4th: 7.5cm(7cm), 5th: 6.5cm(6.5cm)   04/07/2022:   Shoulder flexion: R: 125(150), L: 160(160) Shoulder abduction: R: 109(125), L: 125(125 Elbow: R: 0-148, L: 0-146 Wrist flexion: R: 60(68), L: 79 Writ extension: R: 40(55), L: 5(18) RD: R: 14(24), L: 20(28) UD: R: 18(24), L: 8(18) Thumb radial abduction: R: 20(25), L: 8(20) Digit flexion to the [facility]: R:  2nd: 4.5 cm(4 cm), 3rd: 6.5cm(3cm), 4th: 7 cm (5cm), 5th: 5cm(5cm) L: 2nd: 8cm(8cm), 3rd: 8cm(7cm), 4th: 7.5cm(7cm), 5th: 7cm(7cm)   07/05/2022:   Shoulder flexion: R: 125(154), L: 160(160) Shoulder abduction: R: 95(130), L: 143(143) Elbow: R: 0-148, L: 0-146 Wrist flexion: R: 60(68), L: 79 Writ extension: R: 40(60), L: 0(12) RD: R: 10(24), L: 12(20) UD: R: 16(24), L: 8(16) Thumb radial abduction: R: 20(25), L: 8(20) Digit flexion to the  [facility]: R:  2nd: 4 cm(4cm), 3rd: 6.5cm( 3.5cm), 4th: 7 cm (5cm), 5th: 6cm(5cm) L: 2nd: 8cm(8cm), 3rd: 8cm(7cm), 4th: 8cm(7cm), 5th: 7cm(7cm)   09/13/2022:   Shoulder flexion: R: 125(154), L: 160(160) Shoulder abduction: R: 100(130), L: 143(143) Elbow: R: 0-150, L: 0-150 Wrist flexion: R: 55(68), L: 79 Writ extension: R: 45(60), L: 0(12) RD: R: 10(24), L: 12(20) UD: R: 16(24), L: 8(16) Thumb radial abduction: R: 20(25), L: 8(20) Digit flexion to the [facility]: R:  2nd: 4 cm(4cm), 3rd: 6cm( 5cm), 4th: 6 cm (4cm), 5th: 5cm(5cm) L: 2nd: 7cm(cm), 3rd: 7cm(6cm), 4th: 7cm(6cm), 5th: 6cm(6cm)   10/27/2022   Shoulder flexion: R: 128(154), L: 160(160) Shoulder abduction: R: 105(130), L: 143(143) Elbow: R: 0-150, L: 0-150 Wrist flexion: R: 55(68), L: 79 Writ extension: R: 46(64), L: 0(20) RD: R: 10(20), L: 18(24) UD: R: 18(28), L: 8(20) Thumb radial abduction: R: 30(42), L:20(24) Digit flexion to the [facility]: R:  2nd: 4 cm(3cm), 3rd: 6cm( 6cm), 4th: 6 cm (5cm), 5th: 5cm(5cm) L: 2nd: 8cm(8cm), 3rd: 8cm(6cm), 4th: 8cm(7cm), 5th: 6cm(6cm)  01/19/2023  Shoulder flexion: R: 131(154), L: 160(160) Shoulder abduction: R: 110(130), L: 143(143) Elbow: R: 0-150, L: 0-150 Wrist flexion: R: 55(68), L: 79 Writ extension: R: 46(64), L: 0(20) RD:  R: 10(20), L: 18(24) UD: R: 18(28), L: 14(20) Thumb radial abduction: R: 38(48), L:24(26) Thumb IP flexion: R: 35(50) L: 20(45) Digit flexion to the [facility]: R:  2nd: 3.5 cm(3cm), 3rd: 6cm( 6cm), 4th: 6 cm (5cm), 5th: 5cm(4.5cm) L: 2nd: 8cm(7.5cm), 3rd: 8cm(6cm), 4th: 7.5cm(6cm), 5th: 6cm(4.5cm)  04/04/2023  Shoulder flexion: R: 134(154), L: 160(160) Shoulder abduction: R: 114(130), L: 143(143) Elbow: R: WNL, L: WNL Wrist flexion: R: 55(68), L: 80 Writ extension: R: 46(64), L: -10(10) RD: R: 14(24), L: 20(24) UD: R: 20(30), L: 14(20) Thumb radial abduction: R: 38(48), L:28(30) Thumb IP flexion: R: 40(50) L: 25(45) Digit flexion to the [facility]: R:  2nd: 3.5 cm(3cm),  3rd: 6cm( 5cm), 4th: 6 cm (4cm), 5th: 5cm(5cm) L: 2nd: 8cm(7.5cm), 3rd: 8cm(7cm), 4th: 7.5cm(7cm), 5th: 7cm(6cm)  05/25/2023  Shoulder flexion: R: 134(154), L: 160(160) Shoulder abduction: R: 117(134), L: 143(143) Elbow: R: WNL, L: WNL Wrist flexion: R: 55(68), L: 80 Wrist extension: R: 46(64), L: -8(14) RD: R: 14(24), L: 20(24) UD: R: 20(32), L: 14(22) Thumb radial abduction: R: 38(50), L:28(30) Thumb IP flexion: R: 40(50) L: 30(45) Digit flexion to the [facility]: R:  2nd: 3.5 cm(3cm), 3rd: 6cm( 5cm), 4th: 7 cm (4cm), 5th: 5cm(4cm) L: 2nd: 8cm(7.5cm), 3rd: 8cm(7cm), 4th: 7.5cm(7cm), 5th: 7cm(6cm)  07/13/23  Shoulder flexion: R: 134(154), L: 160(160) Shoulder abduction: R: 122(134), L: 143(143) Elbow: R: WNL, L: WNL Wrist flexion: R: 55(68), L: 80 Wrist extension: R: 50(64), L: -4(18) RD: R: 18(24), L: 24(24) UD: R: 20(32), L: 14(22) Thumb radial abduction: R: 38(50), L:32(30) Thumb IP flexion: R: 42(50) L: 30(45) Digit flexion to the [facility]: R:  2nd: 3.5 cm(3cm), 3rd: 6cm( 5cm), 4th: 7 cm (4cm), 5th: 5cm(4cm) L: 2nd: 8cm(7.5cm), 3rd: 8cm(7cm), 4th: 7.5cm(7cm), 5th: 7cm(6cm)  11/02/23:   Shoulder flexion: R: 136(154), L: 160(160) Shoulder abduction: R: 123(134), L: 143(143) Elbow: R: WNL, L: WNL Wrist flexion: R: 55(68), L: 80 Wrist extension: R: 30(38), L: -16(18) RD: R: 8(18), L: 18(24) UD: R: 12(24), L: 8(22) Thumb radial abduction: R: 28(50), L:28(30) Thumb IP flexion: R: 42(55) L: 10(25) Digit flexion to the [facility]: R:  2nd: 4.5cm(4cm), 3rd: 6cm( 4.5cm), 4th: 6.5cm (5cm), 5th: 5cm(4.5cm) L: 2nd: 8cm(7.5cm), 3rd: 7.5cm(7cm), 4th: 7cm(7cm), 5th: 6cm(6cm)    COORDINATION:   01/19/2023:  9 hole peg test:  Right: 2 pegs placed in 1 min. & 5 sec.  Pt. was able to remove 9 pegs in 17 sec.  Left: 2 pegs placed in 1 min. & 10 sec. Pt. was able to remove 9 pegs in 20 sec.  04/04/23:  Pt. was able to consistently grasp the pegs however each one slipped out of her fingers when  attempting to place them into the Pegboard.  05/25/2023:   Right: 9 pegs placed and removed in 2 min. & 41 sec. Left: Unable to grasp the pegs for a horizontal position.  07/13/23    Right: 9 pegs placed and removed in 2 min. & 11 sec.     11/02/23:                Right 9 Hole Peg Test: 2 min. & 51 sec.   SENSATION: Intact  EDEMA: N/A   COGNITION: Overall cognitive status: WNL   VISION: Subjective report: Wears glasses, No changes in vision.  PERCEPTION: Intact  TODAY'S TREATMENT:  DATE: 12/28/23  Therapeutic Ex.:   There. Ex.:   -Pt. tolerated PROM with slow prolonged gentle  stretching to the bilateral wrist extension with increased focus on left wrist extension. AAROM/PROM for bilateral digit MP, PIP, and DIP flexion, and extension, thumb abduction was performed 2/2 increased extensor tone, and tightness in the left wrist, and bilateral digits.     PATIENT EDUCATION: Education details: Bilateral hand  ROM, condition management, self-care: Condition Management: BP Person educated: Patient Education method: Explanation, Demonstration, and Verbal cues Education comprehension: verbalized understanding and returned demonstration  HOME EXERCISE PROGRAM: Continue to assess HEP needs, and provide as needed.   GOALS: Goals reviewed with patient? Yes  LONG TERM GOALS: Target date: 01/25/2024  3.  Patient will independently use a reacher to pick up items from various surfaces.  Baseline: 11/02/23: Pt. is now able to consistently pick up small objects from the floor with a reacher. 07/13/2023: Pt. is starting to use a reacher to retrieve items with an adaptation/modification to the trigger. 05/25/2023:  Pt. is now able to initiate grasping a reacher to open and close the reacher with the right hand, however requires the grasp handle to be repositioned   within the hand. Pt. requires work on using/moving the reacher with the right hand through various planes. 04/04/2023: Pt. continues to work on improving Digit flexion to the [facility] to be able to efficiently handle Adaptive equipment. 01/19/2023: Pt. Is improving with Bilateral digit flexion to the [facility]. Pt. has difficulty firmly holding adaptive devices. 12/06/2022: Pt. continues to present with increased MP, PIP, and DIP digit tightness/stiffness limiting the formulation of bilateral composites fists. 10/27/2022: Pt. presents with increased MP, PIP, and DIP digit tightness/stiffness limiting the formulation of bilateral composites fists during this progress reporting period limiting. Digit flexion to the [facility]: R:  2nd: 4 cm(3cm), 3rd: 6cm( 6cm), 4th: 6 cm (5cm), 5th: 5cm(5cm), L: 2nd: 8cm(8cm), 3rd: 8cm(6cm), 4th: 8cm(7cm), 5th: 6cm(6cm) 09/20/2022: Improving digit flexion to the [facility]. 09/13/22: improved digit flexion to the [facility]. 07/05/2022: Pt. presents with digit MP, PIP, and DIP extensor tightness limiting her ability to securely grip objects in her bilateral hands.  04/07/2022: Pt. Has improved with right 2nd, and 3rd digit flexion towards the [facility]. Pt. Continues to have difficulty securely holding and applying deodorant.02/03/2022: Pt. has improved with digit flexion, however, continues to have difficulty securely holding and using deodorant. 12/09/2021: Bilateral hand/digit MP, PIP, and DIP extension tightness limits her ability achieve digit flexion to hold, and apply deodorant. 10/28/2021:  Pt. continues to have difficulty holding the deodorant. 01/12/2021: Pt. presents with limited digit extension. Pt. Is able to initiate holding deodorant, however is unable to hold it while using it  Goal status:  Achieved   4.  Pt. Will improve bilateral wrist extension in preparation for anticipating, and initiating reaching for objects at the table.  Baseline: 11/30/23: Continue 11/02/23: Wrist extension: R: 30(38), L: -16(18)  07/13/23: Wrist extension: R: 50(64), L: -4(18) 05/25/23: Wrist extension: R: 46(64), L: -8(14) 04/04/23: R: 46(64) L: -10(10)01/19/2023:  R: 46(64) L: 0(20) 12/06/2022: Pt continues to to progress with bilateral wrist extension in preparation fro functional reaching. 10/27/2022: Pt. Is improving with bilateral wrist extension. R: 46(64) L: 0(20) 09/20/2022: limited PROM in the Left wrist extension 2/2 tightness/stiffness. 09/13/22: R: 55(68), L: 0(12) 07/19/2022: Bilateral wrist extension in limited. 07/05/2022: Right: 40(60) Left: 0(12) 04/07/2022: Right: 40(55) Left: 5(18) 02/03/2022: Right: 34(55) Left: 3(4) 12/09/2021: Right  34(55)  Left  3(4) 10/28/2021: Right 22(38),  Left 0(15) 09/14/2021:  Right: 17(35), left 2(15)  Goal status: Ongoing  7.  Pt. Will increase bilateral lateral pinch strength by 2 lbs to be able to securely grasp items during ADLs, and IADL tasks.  Baseline: 01/19/2023: Deferred as the Pinch meter is out for calibration.12/06/2022: Pt. Is able to more securely hold objects during ADLs/IADLs 10/27/2022: Pt. Is improving with holding items with her bilateral thumbs. (Pinch meter out for calibration) 09/20/2022: lateral pinch continues to be limited 09/13/22: R 6.5# L 4# 07/19/2022: TBD 07/05/2022: TBD 07/2022: NT-Pinch meter out for calibration. 02/03/2022: Right: 6#, Left: 4# 12/09/2021: Right: 6#, Left: 4#  Goal status: Deferred   9.  Pt. will complete plant care with modified independence.  Baseline:12/06/2022: Pt. Is independent with plant care in her husband's room, however is not able to access her plants due to furniture blocking access to them. 10/27/2022: Pt. Is able to water plants that are closer, and within reach. Pt. continues to have difficulty reaching for thorough plant care. 09/20/2022: Pt. Continues to have difficulty reaching for thorough plant care. 09/13/22: continues to report intermittent difficulty 07/19/2022: Pt. Continues to be able to water, and care for some of her plants. Pt.  Has more difficulty with plants that are harder to reach. 07/05/2022: Pt. is able to water, and care for some of her plants. Pt. Has more difficulty with plants that are harder to reach. 04/07/2022: Pt. is now able to hold a cup and water her plants.02/03/2022: Pt. has difficulty caring for her plants.  Goal status:Achieved   10.  Pt. will demonstrate adaptive techniques to assist with the efficiency of self-dressing, or morning care tasks.  Baseline: 07/13/23:Pt. continues to work on improving UE functioning to be able to efficiently use Adaptive equipment 05/25/2023: Pt. continues to work on improving UE functioning to be able to efficiently use Adaptive equipment.  04/04/2023:  Pt. continues to work on improving UE functioning to be able to efficiently use Adaptive equipment. 01/19/2203: Pt. Is able to donn her jacket in reverse independently. Pt. requires assist from staff, as Pt. Reports staff have decreased time. 12/06/2022: Pt. requires assist from staff, as Pt. Reports staff have decreased time. 10/27/2022: Pt. is able to assist with initiating UE dressing. Pt. requires assist from personal, and staff care aides. 09/20/2022: Pt. continues to require assist form personal, and staff care aides. 09/13/22: MODA dressing, reports she is often rushed 07/19/2022: Pt. continues to require assist with self-dressing/morning care tasks. 07/05/2022: Continue with goal. 04/07/2022: Pt. Continues to require assist with the efficiency of self-dressing, and morning care tasks.02/03/2022: Pt. requires assist from caregivers 2/2 time limitations during morning care.  Goal status:  Deferred- Has caregiver assist and morning routine in place.  11.  Pt. will improve BUE strength to improve ADL, and IADL functioning.  Baseline: 11/30/2023: Continue 11/02/23: flexion L 4-/5, R 4-/5; shoulder abduction L 4-/5, R 4-/5. elbow flexion B 4/5,01/19/2023: L 4/5, R 3+/5  07/13/23: flexion L 4/5, R 4-/5; shoulder abduction L 4+/5, R 4-/5. elbow  flexion B 5/5, elbow extension 4+/5 01/19/2023: L 4/5, R 3+/5  05/25/2023: shoulder flexion L 4/5, R 4-/5; shoulder abduction L 4+/5, R 4-/5. elbow flexion B 5/5, elbow extension 4+/5 01/19/2023: L 4/5, R 3+/5  04/04/2023: shoulder flexion L 4/5, R 3+/5; shoulder abduction L 4+/5, R 3+/5. elbow flexion B 5/5, elbow extension 4/5 01/19/2023: L 4/5, R 3+/5 12/06/2022: Continue 10/27/2022: shoulder flexion L 4/5, R 3+/5; shoulder abduction L 4+/5, R 3+/5. elbow flexion B  5/5, elbow extension 4/5 09/20/2022: Pt. Presents with limited BUE strength 09/13/22: shoulder flexion L 4/5, R 3/5; elbow flexion B 5/5, elbow extension 4-/5; shoulder abduction L 4+/5, R 3+/5. 07/19/2022: BUE strength continues to be limited. 07/05/2022: shoulder flexion: right 4-/5, abduction: 3+/5, elbow flexion: right: 5/5, left 5/5, extension: right: 4-/5, left 4-/5, wrist extension: right: 3-/5, left: 2-/5  Goal status: Ongoing  12.  Pt. will improve bilateral thumb radial abduction in order to be able to hold the grab bars while standing with PT  Baseline: 11/30/2023: Continue 11/02/23: Thumb radial abduction: R: 28(50), L:28(30) 07/13/23: Pt. Is improving with holding the grab bars with the right hand .05/25/23: Thumb radial abduction: R: 38(50), L:28(30) 04/04/23: R: 38(48), L:28(30) 01/19/2023: R: 38(48) L: 24(26)12/06/2022: Pt. Continues to present  with limited thumb abduction, however is improving holding onto the parallel bars.10/27/2022: thumb radial abduction: R: 30(42), L: 20(24  Goal status: Ongoing  13.  Pt. Will be able to securely hold, and stabilize medication bottles at a tabletop surface when opening, and closing them.  Baseline: 01/19/2023: Pt. Is now able to securely hold medication bottles while opening them, and stabilizes them on surfaces while opening them. 12/06/2022: Pt is improving with securely stabilizing medication bottles. 10/27/2022: Pt. Stabilizes bottles against her torso when attempting to open, and close them    Goal status: Achieved  14: Pt. Will improve bilateral thumb IP flexion to improve active grasping patterns.    Baseline: 11/30/2023: Continue 11/02/23: Thumb IP flexion: R: 42(55) L: 10(25) 07/13/23: Thumb IP flexion: R: 42(50) L: 30(45) 05/25/2023: Thumb IP flexion: R: 40(50) L: 30(45) 04/04/2023:  R: 40(50) L: 25(45) 01/19/2023: Right 35(50), Left: 20/45)    Goal status: Ongoing    15. Pt. Will improve bilateral FMC/speed, and dexterity. As evidence by improved scores on the 9 hole peg test. Baseline: 11/30/2023: Limited bilateral fine motor coordination;continue 11/02/23: 2 min. & 51 sec. 07/13/2023: Right: 9 pegs placed and removed in 2 min. & 11 sec. 05/25/2023: 9 Hole pegs test speed/dexterity: Right: 9 pegs placed and removed in 2 min. & 41 sec. Left: Unable to grasp the pegs for a horizontal position. 04/04/23: Pt. was able to consistently grasp the pegs however each one slipped out of her fingers when attempting to place them into the Pegboard. 01/19/2023: 2 pegs placed in 1 min. & 5 sec.  Pt. was able to remove 9 pegs in 17 sec.; Left: 2 pegs placed in 1 min. & 10 sec. Pt. was able to remove 9 pegs in 20 sec.   Goal status: Ongoing    16. Pt. Will donn/doff a jacket with modified independence   Baseline: 11/30/2023: Continue 11/02/23: MaxA   Goal status: Ongoing     ASSESSMENT:  CLINICAL IMPRESSION:  Upon arrival, Pt. reports doing well and feeling good today. BP 93/50 with HR 74 bpms. Pt. reports that her BO was high this morning at [facility]. Pt. reports that her legs are always elevated when they take her BP. Pt. Typically arrives upright in her w/c to therapy  without her LEs elevated. Pt. continues to report no pain during the session today. Pt. tolerated ROM well, however, continues to present with tightness in the bilateral wrists, and digits. Pt. continues to benefit from OT services to work on impoving ROM, and UE strength in order to work towards increasing bilateral hand grasp on  objects, and increasing engagement of bilateral hands during ADLs, and IADL tasks    PERFORMANCE DE PERFORMANCE DEFICITS:  in functional skills including ADLs, IADLs, coordination, dexterity, ROM, strength, and UE functional use, cognitive skills including , and psychosocial skills including coping strategies and environmental adaptation.   IMPAIRMENTS: are limiting patient from ADLs, IADLs, and leisure.   CO-MORBIDITIES: may have co-morbidities  that affects occupational performance. Patient will benefit from skilled OT to address above impairments and improve overall function.  MODIFICATION OR ASSISTANCE TO COMPLETE EVALUATION: Min-Moderate modification of tasks or assist with assess necessary to complete an evaluation.  OT OCCUPATIONAL PROFILE AND HISTORY: Detailed assessment: Review of records and additional review of physical, cognitive, psychosocial history related to current functional performance.  CLINICAL DECISION MAKING: Moderate - several treatment options, min-mod task modification necessary  REHAB POTENTIAL: Good for stated goals  PLAN:  OT FREQUENCY 2x's a week  OT DURATION: 12 weeks  PLANNED INTERVENTIONS ADL training, A/E training, UE ther. Ex, Manual therapy, neuromuscular re-education, moist heat modality, Paraffin Bath, Splinting, and  Pt./caregiver education   RECOMMENDED OTHER SERVICES: PT  CONSULTED AND AGREED WITH PLAN OF CARE: Patient  PLAN FOR NEXT SESSION: Continue Treatment as per established POC  Richardson Otter, MS, OTR/L  12/28/23, 1:34 PM

## 2024-01-02 ENCOUNTER — Ambulatory Visit: Admitting: Occupational Therapy

## 2024-01-02 ENCOUNTER — Ambulatory Visit: Attending: Internal Medicine | Admitting: Occupational Therapy

## 2024-01-02 ENCOUNTER — Ambulatory Visit

## 2024-01-02 DIAGNOSIS — R278 Other lack of coordination: Secondary | ICD-10-CM | POA: Insufficient documentation

## 2024-01-02 DIAGNOSIS — M6281 Muscle weakness (generalized): Secondary | ICD-10-CM | POA: Diagnosis present

## 2024-01-02 NOTE — Therapy (Signed)
 Occupational Therapy Neuro Treatment Note  Patient Name: Sherry [name] MRN: [id number] DOB:14-Feb-1934, 89 y.o., female Today's Date: 11/29/2022  PCP:  [facility] PROVIDER: Abdul Fine, MD  END OF SESSION:   OT End of Session - 01/02/24 1154     Visit Number 145    Number of Visits 192    Date for Recertification  01/25/24    OT Start Time 1100    OT Stop Time 1145    OT Time Calculation (min) 45 min    Activity Tolerance Patient tolerated treatment well    Behavior During Therapy [facility] for tasks assessed/performed              Past Medical History:  Diagnosis Date   Acute blood loss anemia    Arthritis    Cancer (HCC)    skin   Central cord syndrome at C6 level of cervical spinal cord (HCC) 11/29/2017   Hypertension    Protein-calorie malnutrition, severe (HCC) 01/24/2018   S/P insertion of IVC (inferior vena caval) filter 01/24/2018   Tetraparesis (HCC)    Past Surgical History:  Procedure Laterality Date   ANTERIOR CERVICAL DECOMP/DISCECTOMY FUSION N/A 11/29/2017   Procedure: Cervical five-six, six-seven Anterior Cervical Decompression Fusion;  Surgeon: Cheryle Debby DELENA, MD;  Location: [facility] OR;  Service: Neurosurgery;  Laterality: N/A;  Cervical five-six, six-seven Anterior Cervical Decompression Fusion   CATARACT EXTRACTION     FEMUR IM NAIL Left 08/16/2021   Procedure: INTRAMEDULLARY (IM) NAIL FEMORAL;  Surgeon: Cleotilde Barrio, MD;  Location: [facility] ORS;  Service: Orthopedics;  Laterality: Left;   IR IVC FILTER PLMT / S&I /IMG GUID/MOD SED  12/08/2017   Patient Active Problem List   Diagnosis Date Noted   Age-related osteoporosis without current pathological fracture 07/27/2022   Mild recurrent major depression (HCC) 07/27/2022   Hypertensive heart disease with other congestive heart failure (HCC) 02/14/2022   Chronic indwelling Foley catheter 02/14/2022   Closed hip fracture (HCC) 08/15/2021   DVT (deep venous thrombosis) (HCC) 08/15/2021    Quadriplegia (HCC) 08/15/2021   Fall 08/15/2021   Constipation due to slow transit 08/31/2018   Trauma 06/05/2018   Neuropathic pain 06/05/2018   Neurogenic bowel 06/05/2018   Vaginal yeast infection 01/30/2018   Healthcare-associated pneumonia 01/25/2018   Chronic allergic rhinitis 01/24/2018   Depression with anxiety 01/24/2018   UTI due to Klebsiella species 01/24/2018   Protein-calorie malnutrition, severe (HCC) 01/24/2018   S/P insertion of IVC (inferior vena caval) filter 01/24/2018   Tetraparesis (HCC) 01/20/2018   Neurogenic bladder 01/20/2018   Reactive depression    Benign essential HTN    Acute postoperative anemia due to expected blood loss    Central cord syndrome at C6 level of cervical spinal cord (HCC) 11/29/2017   Allergy to alpha-gal 11/25/2016   SCC (squamous cell carcinoma) 04/08/2014   ONSET DATE: 11/29/2017  REFERRING DIAG: Central Cord Syndrome at C6 of the Cervical Spinal Cord, Fall with Bilateral Closed Hip Fractures, with ORIF repair with Intramedullary Nailing of the Left Hip Fracture.   THERAPY DIAG:  Muscle weakness (generalized)  Other lack of coordination  Rationale for Evaluation and Treatment: Rehabilitation  SUBJECTIVE:  SUBJECTIVE STATEMENT:  Patient reports that she had a nice Thanksgiving.  Pt accompanied by: self, Personal Care aide  PERTINENT HISTORY: Central Cord Syndrome at C6 of the Cervical Spinal Cord, Fall with Bilateral Closed Hip Fractures, with ORIF repair with Intramedullary Nailing of the Left Hip Fracture.   PRECAUTIONS: None  WEIGHT BEARING  RESTRICTIONS: No  PAIN:  Are you having pain? No pain   LIVING ENVIRONMENT: Lives with: [facility] Stairs: No Has following equipment at home: Wheelchair (power)  PLOF: Independent   PATIENT GOALS:  See below for established goals  OBJECTIVE:  Note: Objective measures were completed at Evaluation unless otherwise noted.  HAND DOMINANCE:  Right  ADLs: Caregiver assist with ADLs, and [facility]  MOBILITY STATUS: Uses a power w/c  ACTIVITY TOLERANCE: Activity tolerance: Fair  FUNCTIONAL OUTCOME MEASURES:  Measurements:   12/09/2021:   Shoulder flexion: R: 120(136), L: 138(150) Shoulder abduction: R: 108(120), L: 110(120) Elbow: R: 0-148, L: 0-146 Wrist flexion: R: 55(62), L: 78 Writ extension: R: 34(55), L: 3(4) RD: R: 12(20), L: 16(26) UD: R: 8(24), L: 6(18) Thumb radial abduction: R: 11(20), L: 8(20) Digit flexion to the [facility]: R:  2nd: 5cm(3.5cm), 3rd: 7.5 cm(5cm), 4th: 7cm(3cm), 5th: 6cm(5.5cm) L: 2nd: 8cm(8cm), 3rd: 8cm(8cm), 4th: 7.5cm(7cm), 5th: 7cm(7cm)   02/03/2022:   Shoulder flexion: R: 122(138), L: 148(154) Shoulder abduction: R: 109(125), L: 125(125 Elbow: R: 0-148, L: 0-146 Wrist flexion: R: 60(68), L: 79 Writ extension: R: 40(55), L: 3(10) RD: R: 14(24), L: 20(28) UD: R: 8(24), L: 6(18) Thumb radial abduction: R: 20(25), L: 8(20) Digit flexion to the [facility]: R:  2nd: 4.5cm(3cm), 3rd: 6.5cm(4.5cm), 4th: 6.5 cm(3cm), 5th: 4.5cm(5cm) L: 2nd: 8cm(8cm), 3rd: 8cm(7cm), 4th: 7.5cm(7cm), 5th: 6.5cm(6.5cm)   04/07/2022:   Shoulder flexion: R: 125(150), L: 160(160) Shoulder abduction: R: 109(125), L: 125(125 Elbow: R: 0-148, L: 0-146 Wrist flexion: R: 60(68), L: 79 Writ extension: R: 40(55), L: 5(18) RD: R: 14(24), L: 20(28) UD: R: 18(24), L: 8(18) Thumb radial abduction: R: 20(25), L: 8(20) Digit flexion to the [facility]: R:  2nd: 4.5 cm(4 cm), 3rd: 6.5cm(3cm), 4th: 7 cm (5cm), 5th: 5cm(5cm) L: 2nd: 8cm(8cm), 3rd: 8cm(7cm), 4th: 7.5cm(7cm), 5th: 7cm(7cm)   07/05/2022:   Shoulder flexion: R: 125(154), L: 160(160) Shoulder abduction: R: 95(130), L: 143(143) Elbow: R: 0-148, L: 0-146 Wrist flexion: R: 60(68), L: 79 Writ extension: R: 40(60), L: 0(12) RD: R: 10(24), L: 12(20) UD: R: 16(24), L: 8(16) Thumb radial abduction: R: 20(25), L: 8(20) Digit flexion to the [facility]: R:  2nd: 4 cm(4cm), 3rd:  6.5cm( 3.5cm), 4th: 7 cm (5cm), 5th: 6cm(5cm) L: 2nd: 8cm(8cm), 3rd: 8cm(7cm), 4th: 8cm(7cm), 5th: 7cm(7cm)   09/13/2022:   Shoulder flexion: R: 125(154), L: 160(160) Shoulder abduction: R: 100(130), L: 143(143) Elbow: R: 0-150, L: 0-150 Wrist flexion: R: 55(68), L: 79 Writ extension: R: 45(60), L: 0(12) RD: R: 10(24), L: 12(20) UD: R: 16(24), L: 8(16) Thumb radial abduction: R: 20(25), L: 8(20) Digit flexion to the [facility] R:  2nd: 4 cm(4cm), 3rd: 6cm( 5cm), 4th: 6 cm (4cm), 5th: 5cm(5cm) L: 2nd: 7cm(cm), 3rd: 7cm(6cm), 4th: 7cm(6cm), 5th: 6cm(6cm)   10/27/2022   Shoulder flexion: R: 128(154), L: 160(160) Shoulder abduction: R: 105(130), L: 143(143) Elbow: R: 0-150, L: 0-150 Wrist flexion: R: 55(68), L: 79 Writ extension: R: 46(64), L: 0(20) RD: R: 10(20), L: 18(24) UD: R: 18(28), L: 8(20) Thumb radial abduction: R: 30(42), L:20(24) Digit flexion to the [facility]: R:  2nd: 4 cm(3cm), 3rd: 6cm( 6cm), 4th: 6 cm (5cm), 5th: 5cm(5cm) L: 2nd: 8cm(8cm), 3rd: 8cm(6cm), 4th: 8cm(7cm), 5th: 6cm(6cm)  01/19/2023  Shoulder flexion: R: 131(154), L: 160(160) Shoulder abduction: R: 110(130), L: 143(143) Elbow: R: 0-150, L: 0-150 Wrist flexion: R: 55(68), L: 79 Writ extension: R: 46(64), L: 0(20) RD: R: 10(20), L: 18(24) UD: R: 18(28), L:  14(20) Thumb radial abduction: R: 38(48), L:24(26) Thumb IP flexion: R: 35(50) L: 20(45) Digit flexion to the [facility]: R:  2nd: 3.5 cm(3cm), 3rd: 6cm( 6cm), 4th: 6 cm (5cm), 5th: 5cm(4.5cm) L: 2nd: 8cm(7.5cm), 3rd: 8cm(6cm), 4th: 7.5cm(6cm), 5th: 6cm(4.5cm)  04/04/2023  Shoulder flexion: R: 134(154), L: 160(160) Shoulder abduction: R: 114(130), L: 143(143) Elbow: R: WNL, L: WNL Wrist flexion: R: 55(68), L: 80 Writ extension: R: 46(64), L: -10(10) RD: R: 14(24), L: 20(24) UD: R: 20(30), L: 14(20) Thumb radial abduction: R: 38(48), L:28(30) Thumb IP flexion: R: 40(50) L: 25(45) Digit flexion to the [facility]: R:  2nd: 3.5 cm(3cm), 3rd: 6cm( 5cm), 4th: 6 cm  (4cm), 5th: 5cm(5cm) L: 2nd: 8cm(7.5cm), 3rd: 8cm(7cm), 4th: 7.5cm(7cm), 5th: 7cm(6cm)  05/25/2023  Shoulder flexion: R: 134(154), L: 160(160) Shoulder abduction: R: 117(134), L: 143(143) Elbow: R: WNL, L: WNL Wrist flexion: R: 55(68), L: 80 Wrist extension: R: 46(64), L: -8(14) RD: R: 14(24), L: 20(24) UD: R: 20(32), L: 14(22) Thumb radial abduction: R: 38(50), L:28(30) Thumb IP flexion: R: 40(50) L: 30(45) Digit flexion to the [facility]: R:  2nd: 3.5 cm(3cm), 3rd: 6cm( 5cm), 4th: 7 cm (4cm), 5th: 5cm(4cm) L: 2nd: 8cm(7.5cm), 3rd: 8cm(7cm), 4th: 7.5cm(7cm), 5th: 7cm(6cm)  07/13/23  Shoulder flexion: R: 134(154), L: 160(160) Shoulder abduction: R: 122(134), L: 143(143) Elbow: R: WNL, L: WNL Wrist flexion: R: 55(68), L: 80 Wrist extension: R: 50(64), L: -4(18) RD: R: 18(24), L: 24(24) UD: R: 20(32), L: 14(22) Thumb radial abduction: R: 38(50), L:32(30) Thumb IP flexion: R: 42(50) L: 30(45) Digit flexion to the [facility]: R:  2nd: 3.5 cm(3cm), 3rd: 6cm( 5cm), 4th: 7 cm (4cm), 5th: 5cm(4cm) L: 2nd: 8cm(7.5cm), 3rd: 8cm(7cm), 4th: 7.5cm(7cm), 5th: 7cm(6cm)  11/02/23:   Shoulder flexion: R: 136(154), L: 160(160) Shoulder abduction: R: 123(134), L: 143(143) Elbow: R: WNL, L: WNL Wrist flexion: R: 55(68), L: 80 Wrist extension: R: 30(38), L: -16(18) RD: R: 8(18), L: 18(24) UD: R: 12(24), L: 8(22) Thumb radial abduction: R: 28(50), L:28(30) Thumb IP flexion: R: 42(55) L: 10(25) Digit flexion to the [facility]: R:  2nd: 4.5cm(4cm), 3rd: 6cm( 4.5cm), 4th: 6.5cm (5cm), 5th: 5cm(4.5cm) L: 2nd: 8cm(7.5cm), 3rd: 7.5cm(7cm), 4th: 7cm(7cm), 5th: 6cm(6cm)    COORDINATION:   01/19/2023:  9 hole peg test:  Right: 2 pegs placed in 1 min. & 5 sec.  Pt. was able to remove 9 pegs in 17 sec.  Left: 2 pegs placed in 1 min. & 10 sec. Pt. was able to remove 9 pegs in 20 sec.  04/04/23:  Pt. was able to consistently grasp the pegs however each one slipped out of her fingers when attempting to place them  into the Pegboard.  05/25/2023:   Right: 9 pegs placed and removed in 2 min. & 41 sec. Left: Unable to grasp the pegs for a horizontal position.  07/13/23    Right: 9 pegs placed and removed in 2 min. & 11 sec.     11/02/23:                Right 9 Hole Peg Test: 2 min. & 51 sec.   SENSATION: Intact  EDEMA: N/A   COGNITION: Overall cognitive status: WNL   VISION: Subjective report: Wears glasses, No changes in vision.  PERCEPTION: Intact  TODAY'S TREATMENT:  DATE: 01/02/24  Therapeutic Ex.:    -Pt. tolerated PROM with slow prolonged gentle stretching to the bilateral wrist extension with increased focus on left wrist extension. AAROM/PROM for bilateral digit MP, PIP, and DIP flexion, and extension, thumb abduction was performed 2/2 increased extensor tone, and tightness in the left wrist, and bilateral digits. -Pt. performed BUE strengthening using a 2.5# dowel ex. 2/2 to weakness. Bilateral shoulder flexion, chest press, circular patterns, and elbow flexion/extension for 2 sets  10 reps each.      PATIENT EDUCATION: Education details: Bilateral hand  ROM, condition management, self-care: Condition Management: BP Person educated: Patient Education method: Explanation, Demonstration, and Verbal cues Education comprehension: verbalized understanding and returned demonstration  HOME EXERCISE PROGRAM: Continue to assess HEP needs, and provide as needed.   GOALS: Goals reviewed with patient? Yes  LONG TERM GOALS: Target date: 01/25/2024  3.  Patient will independently use a reacher to pick up items from various surfaces.  Baseline: 11/02/23: Pt. is now able to consistently pick up small objects from the floor with a reacher. 07/13/2023: Pt. is starting to use a reacher to retrieve items with an adaptation/modification to the trigger. 05/25/2023:  Pt.  is now able to initiate grasping a reacher to open and close the reacher with the right hand, however requires the grasp handle to be repositioned  within the hand. Pt. requires work on using/moving the reacher with the right hand through various planes. 04/04/2023: Pt. continues to work on improving Digit flexion to the [facility] to be able to efficiently handle Adaptive equipment. 01/19/2023: Pt. Is improving with Bilateral digit flexion to the [facility]. Pt. has difficulty firmly holding adaptive devices. 12/06/2022: Pt. continues to present with increased MP, PIP, and DIP digit tightness/stiffness limiting the formulation of bilateral composites fists. 10/27/2022: Pt. presents with increased MP, PIP, and DIP digit tightness/stiffness limiting the formulation of bilateral composites fists during this progress reporting period limiting. Digit flexion to the [facility]: R:  2nd: 4 cm(3cm), 3rd: 6cm( 6cm), 4th: 6 cm (5cm), 5th: 5cm(5cm), L: 2nd: 8cm(8cm), 3rd: 8cm(6cm), 4th: 8cm(7cm), 5th: 6cm(6cm) 09/20/2022: Improving digit flexion to the [facility]. 09/13/22: improved digit flexion to the [facility]. 07/05/2022: Pt. presents with digit MP, PIP, and DIP extensor tightness limiting her ability to securely grip objects in her bilateral hands.  04/07/2022: Pt. Has improved with right 2nd, and 3rd digit flexion towards the [facility]. Pt. Continues to have difficulty securely holding and applying deodorant.02/03/2022: Pt. has improved with digit flexion, however, continues to have difficulty securely holding and using deodorant. 12/09/2021: Bilateral hand/digit MP, PIP, and DIP extension tightness limits her ability achieve digit flexion to hold, and apply deodorant. 10/28/2021:  Pt. continues to have difficulty holding the deodorant. 01/12/2021: Pt. presents with limited digit extension. Pt. Is able to initiate holding deodorant, however is unable to hold it while using it  Goal status:  Achieved   4.  Pt. Will improve bilateral wrist extension in preparation  for anticipating, and initiating reaching for objects at the table.  Baseline: 11/30/23: Continue 11/02/23: Wrist extension: R: 30(38), L: -16(18) 07/13/23: Wrist extension: R: 50(64), L: -4(18) 05/25/23: Wrist extension: R: 46(64), L: -8(14) 04/04/23: R: 46(64) L: -10(10)01/19/2023:  R: 46(64) L: 0(20) 12/06/2022: Pt continues to to progress with bilateral wrist extension in preparation fro functional reaching. 10/27/2022: Pt. Is improving with bilateral wrist extension. R: 46(64) L: 0(20) 09/20/2022: limited PROM in the Left wrist extension 2/2 tightness/stiffness. 09/13/22: R: 55(68), L: 0(12) 07/19/2022: Bilateral wrist extension in limited.  07/05/2022: Right: 40(60) Left: 0(12) 04/07/2022: Right: 40(55) Left: 5(18) 02/03/2022: Right: 34(55) Left: 3(4) 12/09/2021: Right  34(55)  Left  3(4) 10/28/2021: Right 22(38), Left 0(15) 09/14/2021:  Right: 17(35), left 2(15)  Goal status: Ongoing  7.  Pt. Will increase bilateral lateral pinch strength by 2 lbs to be able to securely grasp items during ADLs, and IADL tasks.  Baseline: 01/19/2023: Deferred as the Pinch meter is out for calibration.12/06/2022: Pt. Is able to more securely hold objects during ADLs/IADLs 10/27/2022: Pt. Is improving with holding items with her bilateral thumbs. (Pinch meter out for calibration) 09/20/2022: lateral pinch continues to be limited 09/13/22: R 6.5# L 4# 07/19/2022: TBD 07/05/2022: TBD 07/2022: NT-Pinch meter out for calibration. 02/03/2022: Right: 6#, Left: 4# 12/09/2021: Right: 6#, Left: 4#  Goal status: Deferred   9.  Pt. will complete plant care with modified independence.  Baseline:12/06/2022: Pt. Is independent with plant care in her husband's room, however is not able to access her plants due to furniture blocking access to them. 10/27/2022: Pt. Is able to water plants that are closer, and within reach. Pt. continues to have difficulty reaching for thorough plant care. 09/20/2022: Pt. Continues to have difficulty reaching for thorough  plant care. 09/13/22: continues to report intermittent difficulty 07/19/2022: Pt. Continues to be able to water, and care for some of her plants. Pt. Has more difficulty with plants that are harder to reach. 07/05/2022: Pt. is able to water, and care for some of her plants. Pt. Has more difficulty with plants that are harder to reach. 04/07/2022: Pt. is now able to hold a cup and water her plants.02/03/2022: Pt. has difficulty caring for her plants.  Goal status:Achieved   10.  Pt. will demonstrate adaptive techniques to assist with the efficiency of self-dressing, or morning care tasks.  Baseline: 07/13/23:Pt. continues to work on improving UE functioning to be able to efficiently use Adaptive equipment 05/25/2023: Pt. continues to work on improving UE functioning to be able to efficiently use Adaptive equipment.  04/04/2023:  Pt. continues to work on improving UE functioning to be able to efficiently use Adaptive equipment. 01/19/2203: Pt. Is able to donn her jacket in reverse independently. Pt. requires assist from staff, as Pt. Reports staff have decreased time. 12/06/2022: Pt. requires assist from staff, as Pt. Reports staff have decreased time. 10/27/2022: Pt. is able to assist with initiating UE dressing. Pt. requires assist from personal, and staff care aides. 09/20/2022: Pt. continues to require assist form personal, and staff care aides. 09/13/22: MODA dressing, reports she is often rushed 07/19/2022: Pt. continues to require assist with self-dressing/morning care tasks. 07/05/2022: Continue with goal. 04/07/2022: Pt. Continues to require assist with the efficiency of self-dressing, and morning care tasks.02/03/2022: Pt. requires assist from caregivers 2/2 time limitations during morning care.  Goal status:  Deferred- Has caregiver assist and morning routine in place.  11.  Pt. will improve BUE strength to improve ADL, and IADL functioning.  Baseline: 11/30/2023: Continue 11/02/23: flexion L 4-/5, R 4-/5;  shoulder abduction L 4-/5, R 4-/5. elbow flexion B 4/5,01/19/2023: L 4/5, R 3+/5  07/13/23: flexion L 4/5, R 4-/5; shoulder abduction L 4+/5, R 4-/5. elbow flexion B 5/5, elbow extension 4+/5 01/19/2023: L 4/5, R 3+/5  05/25/2023: shoulder flexion L 4/5, R 4-/5; shoulder abduction L 4+/5, R 4-/5. elbow flexion B 5/5, elbow extension 4+/5 01/19/2023: L 4/5, R 3+/5  04/04/2023: shoulder flexion L 4/5, R 3+/5; shoulder abduction L 4+/5, R 3+/5. elbow flexion B 5/5,  elbow extension 4/5 01/19/2023: L 4/5, R 3+/5 12/06/2022: Continue 10/27/2022: shoulder flexion L 4/5, R 3+/5; shoulder abduction L 4+/5, R 3+/5. elbow flexion B 5/5, elbow extension 4/5 09/20/2022: Pt. Presents with limited BUE strength 09/13/22: shoulder flexion L 4/5, R 3/5; elbow flexion B 5/5, elbow extension 4-/5; shoulder abduction L 4+/5, R 3+/5. 07/19/2022: BUE strength continues to be limited. 07/05/2022: shoulder flexion: right 4-/5, abduction: 3+/5, elbow flexion: right: 5/5, left 5/5, extension: right: 4-/5, left 4-/5, wrist extension: right: 3-/5, left: 2-/5  Goal status: Ongoing  12.  Pt. will improve bilateral thumb radial abduction in order to be able to hold the grab bars while standing with PT  Baseline: 11/30/2023: Continue 11/02/23: Thumb radial abduction: R: 28(50), L:28(30) 07/13/23: Pt. Is improving with holding the grab bars with the right hand .05/25/23: Thumb radial abduction: R: 38(50), L:28(30) 04/04/23: R: 38(48), L:28(30) 01/19/2023: R: 38(48) L: 24(26)12/06/2022: Pt. Continues to present  with limited thumb abduction, however is improving holding onto the parallel bars.10/27/2022: thumb radial abduction: R: 30(42), L: 20(24  Goal status: Ongoing  13.  Pt. Will be able to securely hold, and stabilize medication bottles at a tabletop surface when opening, and closing them.  Baseline: 01/19/2023: Pt. Is now able to securely hold medication bottles while opening them, and stabilizes them on surfaces while opening them. 12/06/2022: Pt  is improving with securely stabilizing medication bottles. 10/27/2022: Pt. Stabilizes bottles against her torso when attempting to open, and close them   Goal status: Achieved  14: Pt. Will improve bilateral thumb IP flexion to improve active grasping patterns.    Baseline: 11/30/2023: Continue 11/02/23: Thumb IP flexion: R: 42(55) L: 10(25) 07/13/23: Thumb IP flexion: R: 42(50) L: 30(45) 05/25/2023: Thumb IP flexion: R: 40(50) L: 30(45) 04/04/2023:  R: 40(50) L: 25(45) 01/19/2023: Right 35(50), Left: 20/45)    Goal status: Ongoing    15. Pt. Will improve bilateral FMC/speed, and dexterity. As evidence by improved scores on the 9 hole peg test. Baseline: 11/30/2023: Limited bilateral fine motor coordination;continue 11/02/23: 2 min. & 51 sec. 07/13/2023: Right: 9 pegs placed and removed in 2 min. & 11 sec. 05/25/2023: 9 Hole pegs test speed/dexterity: Right: 9 pegs placed and removed in 2 min. & 41 sec. Left: Unable to grasp the pegs for a horizontal position. 04/04/23: Pt. was able to consistently grasp the pegs however each one slipped out of her fingers when attempting to place them into the Pegboard. 01/19/2023: 2 pegs placed in 1 min. & 5 sec.  Pt. was able to remove 9 pegs in 17 sec.; Left: 2 pegs placed in 1 min. & 10 sec. Pt. was able to remove 9 pegs in 20 sec.   Goal status: Ongoing    16. Pt. Will donn/doff a jacket with modified independence   Baseline: 11/30/2023: Continue 11/02/23: MaxA   Goal status: Ongoing     ASSESSMENT:  CLINICAL IMPRESSION:  Upon arrival, Pt. reports doing well and feeling good today. BP 146/69 with HR 85 bpms. Pt. tolerated  all ROM and UE strengthening well with 2.5# dowel weight for 2 sets 10 reps each. Pt. continues to present with tightness/stiffness in the bilateral wrists, and digits. Pt. continues to benefit from OT services to work on impoving ROM, and UE strength in order to work towards increasing bilateral hand grasp on objects, and increasing engagement  of bilateral hands during ADLs, and IADL tasks    PERFORMANCE DE PERFORMANCE DEFICITS: in functional skills including ADLs, IADLs, coordination, dexterity,  ROM, strength, and UE functional use, cognitive skills including , and psychosocial skills including coping strategies and environmental adaptation.   IMPAIRMENTS: are limiting patient from ADLs, IADLs, and leisure.   CO-MORBIDITIES: may have co-morbidities  that affects occupational performance. Patient will benefit from skilled OT to address above impairments and improve overall function.  MODIFICATION OR ASSISTANCE TO COMPLETE EVALUATION: Min-Moderate modification of tasks or assist with assess necessary to complete an evaluation.  OT OCCUPATIONAL PROFILE AND HISTORY: Detailed assessment: Review of records and additional review of physical, cognitive, psychosocial history related to current functional performance.  CLINICAL DECISION MAKING: Moderate - several treatment options, min-mod task modification necessary  REHAB POTENTIAL: Good for stated goals  PLAN:  OT FREQUENCY 2x's a week  OT DURATION: 12 weeks  PLANNED INTERVENTIONS ADL training, A/E training, UE ther. Ex, Manual therapy, neuromuscular re-education, moist heat modality, Paraffin Bath, Splinting, and  Pt./caregiver education   RECOMMENDED OTHER SERVICES: PT  CONSULTED AND AGREED WITH PLAN OF CARE: Patient  PLAN FOR NEXT SESSION: Continue Treatment as per established POC  Richardson Otter, MS, OTR/L  01/02/24, 11:56 AM

## 2024-01-04 ENCOUNTER — Ambulatory Visit

## 2024-01-04 ENCOUNTER — Ambulatory Visit: Admitting: Occupational Therapy

## 2024-01-04 DIAGNOSIS — R278 Other lack of coordination: Secondary | ICD-10-CM

## 2024-01-04 DIAGNOSIS — M6281 Muscle weakness (generalized): Secondary | ICD-10-CM

## 2024-01-04 NOTE — Therapy (Signed)
 Occupational Therapy Neuro Treatment Note  Patient Name: Sherry [name] MRN: [id number] DOB:1935/01/10, 89 y.o., female Today's Date: 11/29/2022  PCP:  [facility] PROVIDER: Abdul Fine, MD  END OF SESSION:   OT End of Session - 01/04/24 1154     Visit Number [id number]    Number of Visits 192    Date for Recertification  01/25/24    Authorization Time Period Progress report period starting 04/04/23    OT Start Time 1100    OT Stop Time 1145    OT Time Calculation (min) 45 min    Equipment Utilized During Treatment powered wheelchair    Activity Tolerance Patient tolerated treatment well    Behavior During Therapy WFL for tasks assessed/performed              Past Medical History:  Diagnosis Date   Acute blood loss anemia    Arthritis    Cancer (HCC)    skin   Central cord syndrome at C6 level of cervical spinal cord (HCC) 11/29/2017   Hypertension    Protein-calorie malnutrition, severe (HCC) 01/24/2018   S/P insertion of IVC (inferior vena caval) filter 01/24/2018   Tetraparesis (HCC)    Past Surgical History:  Procedure Laterality Date   ANTERIOR CERVICAL DECOMP/DISCECTOMY FUSION N/A 11/29/2017   Procedure: Cervical five-six, six-seven Anterior Cervical Decompression Fusion;  Surgeon: Cheryle Debby DELENA, MD;  Location: [facility] OR;  Service: Neurosurgery;  Laterality: N/A;  Cervical five-six, six-seven Anterior Cervical Decompression Fusion   CATARACT EXTRACTION     FEMUR IM NAIL Left 08/16/2021   Procedure: INTRAMEDULLARY (IM) NAIL FEMORAL;  Surgeon: Cleotilde Barrio, MD;  Location: [facility] ORS;  Service: Orthopedics;  Laterality: Left;   IR IVC FILTER PLMT / S&I /IMG GUID/MOD SED  12/08/2017   Patient Active Problem List   Diagnosis Date Noted   Age-related osteoporosis without current pathological fracture 07/27/2022   Mild recurrent major depression (HCC) 07/27/2022   Hypertensive heart disease with other congestive heart failure (HCC) 02/14/2022   Chronic  indwelling Foley catheter 02/14/2022   Closed hip fracture (HCC) 08/15/2021   DVT (deep venous thrombosis) (HCC) 08/15/2021   Quadriplegia (HCC) 08/15/2021   Fall 08/15/2021   Constipation due to slow transit 08/31/2018   Trauma 06/05/2018   Neuropathic pain 06/05/2018   Neurogenic bowel 06/05/2018   Vaginal yeast infection 01/30/2018   Healthcare-associated pneumonia 01/25/2018   Chronic allergic rhinitis 01/24/2018   Depression with anxiety 01/24/2018   UTI due to Klebsiella species 01/24/2018   Protein-calorie malnutrition, severe (HCC) 01/24/2018   S/P insertion of IVC (inferior vena caval) filter 01/24/2018   Tetraparesis (HCC) 01/20/2018   Neurogenic bladder 01/20/2018   Reactive depression    Benign essential HTN    Acute postoperative anemia due to expected blood loss    Central cord syndrome at C6 level of cervical spinal cord (HCC) 11/29/2017   Allergy to alpha-gal 11/25/2016   SCC (squamous cell carcinoma) 04/08/2014   ONSET DATE: 11/29/2017  REFERRING DIAG: Central Cord Syndrome at C6 of the Cervical Spinal Cord, Fall with Bilateral Closed Hip Fractures, with ORIF repair with Intramedullary Nailing of the Left Hip Fracture.   THERAPY DIAG:  Muscle weakness (generalized)  Other lack of coordination  Rationale for Evaluation and Treatment: Rehabilitation  SUBJECTIVE:  SUBJECTIVE STATEMENT:  Patient reports that she was able to attend the gingerbread holiday market prior to coming to therapy this morning. Pt accompanied by: self, Personal Care aide  PERTINENT HISTORY: Central Cord Syndrome at  C6 of the Cervical Spinal Cord, Fall with Bilateral Closed Hip Fractures, with ORIF repair with Intramedullary Nailing of the Left Hip Fracture.   PRECAUTIONS: None  WEIGHT BEARING RESTRICTIONS: No  PAIN:  Are you having pain? No pain   LIVING ENVIRONMENT: Lives with: [facility] Stairs: No Has following equipment at home: Wheelchair  (power)  PLOF: Independent   PATIENT GOALS:  See below for established goals  OBJECTIVE:  Note: Objective measures were completed at Evaluation unless otherwise noted.  HAND DOMINANCE: Right  ADLs: Caregiver assist with ADLs, and [facility]  MOBILITY STATUS: Uses a power w/c  ACTIVITY TOLERANCE: Activity tolerance: Fair  FUNCTIONAL OUTCOME MEASURES:  Measurements:   12/09/2021:   Shoulder flexion: R: 120(136), L: 138(150) Shoulder abduction: R: 108(120), L: 110(120) Elbow: R: 0-148, L: 0-146 Wrist flexion: R: 55(62), L: 78 Writ extension: R: 34(55), L: 3(4) RD: R: 12(20), L: 16(26) UD: R: 8(24), L: 6(18) Thumb radial abduction: R: 11(20), L: 8(20) Digit flexion to the [facility]: R:  2nd: 5cm(3.5cm), 3rd: 7.5 cm(5cm), 4th: 7cm(3cm), 5th: 6cm(5.5cm) L: 2nd: 8cm(8cm), 3rd: 8cm(8cm), 4th: 7.5cm(7cm), 5th: 7cm(7cm)   02/03/2022:   Shoulder flexion: R: 122(138), L: 148(154) Shoulder abduction: R: 109(125), L: 125(125 Elbow: R: 0-148, L: 0-146 Wrist flexion: R: 60(68), L: 79 Writ extension: R: 40(55), L: 3(10) RD: R: 14(24), L: 20(28) UD: R: 8(24), L: 6(18) Thumb radial abduction: R: 20(25), L: 8(20) Digit flexion to the [facility]: R:  2nd: 4.5cm(3cm), 3rd: 6.5cm(4.5cm), 4th: 6.5 cm(3cm), 5th: 4.5cm(5cm) L: 2nd: 8cm(8cm), 3rd: 8cm(7cm), 4th: 7.5cm(7cm), 5th: 6.5cm(6.5cm)   04/07/2022:   Shoulder flexion: R: 125(150), L: 160(160) Shoulder abduction: R: 109(125), L: 125(125 Elbow: R: 0-148, L: 0-146 Wrist flexion: R: 60(68), L: 79 Writ extension: R: 40(55), L: 5(18) RD: R: 14(24), L: 20(28) UD: R: 18(24), L: 8(18) Thumb radial abduction: R: 20(25), L: 8(20) Digit flexion to the [place]: R:  2nd: 4.5 cm(4 cm), 3rd: 6.5cm(3cm), 4th: 7 cm (5cm), 5th: 5cm(5cm) L: 2nd: 8cm(8cm), 3rd: 8cm(7cm), 4th: 7.5cm(7cm), 5th: 7cm(7cm)   07/05/2022:   Shoulder flexion: R: 125(154), L: 160(160) Shoulder abduction: R: 95(130), L: 143(143) Elbow: R: 0-148, L: 0-146 Wrist flexion: R: 60(68),  L: 79 Writ extension: R: 40(60), L: 0(12) RD: R: 10(24), L: 12(20) UD: R: 16(24), L: 8(16) Thumb radial abduction: R: 20(25), L: 8(20) Digit flexion to the [facility]: R:  2nd: 4 cm(4cm), 3rd: 6.5cm( 3.5cm), 4th: 7 cm (5cm), 5th: 6cm(5cm) L: 2nd: 8cm(8cm), 3rd: 8cm(7cm), 4th: 8cm(7cm), 5th: 7cm(7cm)   09/13/2022:   Shoulder flexion: R: 125(154), L: 160(160) Shoulder abduction: R: 100(130), L: 143(143) Elbow: R: 0-150, L: 0-150 Wrist flexion: R: 55(68), L: 79 Writ extension: R: 45(60), L: 0(12) RD: R: 10(24), L: 12(20) UD: R: 16(24), L: 8(16) Thumb radial abduction: R: 20(25), L: 8(20) Digit flexion to the [facility]: R:  2nd: 4 cm(4cm), 3rd: 6cm( 5cm), 4th: 6 cm (4cm), 5th: 5cm(5cm) L: 2nd: 7cm(cm), 3rd: 7cm(6cm), 4th: 7cm(6cm), 5th: 6cm(6cm)   10/27/2022   Shoulder flexion: R: 128(154), L: 160(160) Shoulder abduction: R: 105(130), L: 143(143) Elbow: R: 0-150, L: 0-150 Wrist flexion: R: 55(68), L: 79 Writ extension: R: 46(64), L: 0(20) RD: R: 10(20), L: 18(24) UD: R: 18(28), L: 8(20) Thumb radial abduction: R: 30(42), L:20(24) Digit flexion to the [facility]: R:  2nd: 4 cm(3cm), 3rd: 6cm( 6cm), 4th: 6 cm (5cm), 5th: 5cm(5cm) L: 2nd: 8cm(8cm), 3rd: 8cm(6cm), 4th: 8cm(7cm), 5th: 6cm(6cm)  01/19/2023  Shoulder flexion: R: 131(154), L: 160(160) Shoulder abduction:  R: 110(130), L: 143(143) Elbow: R: 0-150, L: 0-150 Wrist flexion: R: 55(68), L: 79 Writ extension: R: 46(64), L: 0(20) RD: R: 10(20), L: 18(24) UD: R: 18(28), L: 14(20) Thumb radial abduction: R: 38(48), L:24(26) Thumb IP flexion: R: 35(50) L: 20(45) Digit flexion to the [facility]: R:  2nd: 3.5 cm(3cm), 3rd: 6cm( 6cm), 4th: 6 cm (5cm), 5th: 5cm(4.5cm) L: 2nd: 8cm(7.5cm), 3rd: 8cm(6cm), 4th: 7.5cm(6cm), 5th: 6cm(4.5cm)  04/04/2023  Shoulder flexion: R: 134(154), L: 160(160) Shoulder abduction: R: 114(130), L: 143(143) Elbow: R: WNL, L: WNL Wrist flexion: R: 55(68), L: 80 Writ extension: R: 46(64), L: -10(10) RD: R: 14(24), L:  20(24) UD: R: 20(30), L: 14(20) Thumb radial abduction: R: 38(48), L:28(30) Thumb IP flexion: R: 40(50) L: 25(45) Digit flexion to the [facility]: R:  2nd: 3.5 cm(3cm), 3rd: 6cm( 5cm), 4th: 6 cm (4cm), 5th: 5cm(5cm) L: 2nd: 8cm(7.5cm), 3rd: 8cm(7cm), 4th: 7.5cm(7cm), 5th: 7cm(6cm)  05/25/2023  Shoulder flexion: R: 134(154), L: 160(160) Shoulder abduction: R: 117(134), L: 143(143) Elbow: R: WNL, L: WNL Wrist flexion: R: 55(68), L: 80 Wrist extension: R: 46(64), L: -8(14) RD: R: 14(24), L: 20(24) UD: R: 20(32), L: 14(22) Thumb radial abduction: R: 38(50), L:28(30) Thumb IP flexion: R: 40(50) L: 30(45) Digit flexion to the [facility]: R:  2nd: 3.5 cm(3cm), 3rd: 6cm( 5cm), 4th: 7 cm (4cm), 5th: 5cm(4cm) L: 2nd: 8cm(7.5cm), 3rd: 8cm(7cm), 4th: 7.5cm(7cm), 5th: 7cm(6cm)  07/13/23  Shoulder flexion: R: 134(154), L: 160(160) Shoulder abduction: R: 122(134), L: 143(143) Elbow: R: WNL, L: WNL Wrist flexion: R: 55(68), L: 80 Wrist extension: R: 50(64), L: -4(18) RD: R: 18(24), L: 24(24) UD: R: 20(32), L: 14(22) Thumb radial abduction: R: 38(50), L:32(30) Thumb IP flexion: R: 42(50) L: 30(45) Digit flexion to the [facility]: R:  2nd: 3.5 cm(3cm), 3rd: 6cm( 5cm), 4th: 7 cm (4cm), 5th: 5cm(4cm) L: 2nd: 8cm(7.5cm), 3rd: 8cm(7cm), 4th: 7.5cm(7cm), 5th: 7cm(6cm)  11/02/23:   Shoulder flexion: R: 136(154), L: 160(160) Shoulder abduction: R: 123(134), L: 143(143) Elbow: R: WNL, L: WNL Wrist flexion: R: 55(68), L: 80 Wrist extension: R: 30(38), L: -16(18) RD: R: 8(18), L: 18(24) UD: R: 12(24), L: 8(22) Thumb radial abduction: R: 28(50), L:28(30) Thumb IP flexion: R: 42(55) L: 10(25) Digit flexion to the [facility]: R:  2nd: 4.5cm(4cm), 3rd: 6cm( 4.5cm), 4th: 6.5cm (5cm), 5th: 5cm(4.5cm) L: 2nd: 8cm(7.5cm), 3rd: 7.5cm(7cm), 4th: 7cm(7cm), 5th: 6cm(6cm)    COORDINATION:   01/19/2023:  9 hole peg test:  Right: 2 pegs placed in 1 min. & 5 sec.  Pt. was able to remove 9 pegs in 17 sec.  Left: 2 pegs placed  in 1 min. & 10 sec. Pt. was able to remove 9 pegs in 20 sec.  04/04/23:  Pt. was able to consistently grasp the pegs however each one slipped out of her fingers when attempting to place them into the Pegboard.  05/25/2023:   Right: 9 pegs placed and removed in 2 min. & 41 sec. Left: Unable to grasp the pegs for a horizontal position.  07/13/23    Right: 9 pegs placed and removed in 2 min. & 11 sec.     11/02/23:                Right 9 Hole Peg Test: 2 min. & 51 sec.   SENSATION: Intact  EDEMA: N/A   COGNITION: Overall cognitive status: WNL   VISION: Subjective report: Wears glasses, No changes in vision.  PERCEPTION: Intact  TODAY'S TREATMENT:  DATE: 01/04/24  Therapeutic Ex.:    -Pt. tolerated PROM with slow prolonged gentle stretching to the bilateral wrist extension with increased focus on left wrist extension. AAROM/PROM for bilateral digit MP, PIP, and DIP flexion, and extension, thumb abduction was performed 2/2 increased extensor tone, and tightness in the left wrist, and bilateral digits.  -Pt. performed BUE strengthening using a 2.5# dowel ex. 2/2 to weakness. Bilateral shoulder flexion, chest press, circular patterns, and elbow flexion/extension for 1-2 sets  10 reps each.      PATIENT EDUCATION: Education details: Bilateral hand  ROM, condition management, self-care: Condition Management: BP Person educated: Patient Education method: Explanation, Demonstration, and Verbal cues Education comprehension: verbalized understanding and returned demonstration  HOME EXERCISE PROGRAM: Continue to assess HEP needs, and provide as needed.   GOALS: Goals reviewed with patient? Yes  LONG TERM GOALS: Target date: 01/25/2024  3.  Patient will independently use a reacher to pick up items from various surfaces.  Baseline: 11/02/23: Pt. is now able  to consistently pick up small objects from the floor with a reacher. 07/13/2023: Pt. is starting to use a reacher to retrieve items with an adaptation/modification to the trigger. 05/25/2023:  Pt. is now able to initiate grasping a reacher to open and close the reacher with the right hand, however requires the grasp handle to be repositioned  within the hand. Pt. requires work on using/moving the reacher with the right hand through various planes. 04/04/2023: Pt. continues to work on improving Digit flexion to the [facility] to be able to efficiently handle Adaptive equipment. 01/19/2023: Pt. Is improving with Bilateral digit flexion to the [facility]. Pt. has difficulty firmly holding adaptive devices. 12/06/2022: Pt. continues to present with increased MP, PIP, and DIP digit tightness/stiffness limiting the formulation of bilateral composites fists. 10/27/2022: Pt. presents with increased MP, PIP, and DIP digit tightness/stiffness limiting the formulation of bilateral composites fists during this progress reporting period limiting. Digit flexion to the [facility]: R:  2nd: 4 cm(3cm), 3rd: 6cm( 6cm), 4th: 6 cm (5cm), 5th: 5cm(5cm), L: 2nd: 8cm(8cm), 3rd: 8cm(6cm), 4th: 8cm(7cm), 5th: 6cm(6cm) 09/20/2022: Improving digit flexion to the [facility]. 09/13/22: improved digit flexion to the [facility]. 07/05/2022: Pt. presents with digit MP, PIP, and DIP extensor tightness limiting her ability to securely grip objects in her bilateral hands.  04/07/2022: Pt. Has improved with right 2nd, and 3rd digit flexion towards the [facility]. Pt. Continues to have difficulty securely holding and applying deodorant.02/03/2022: Pt. has improved with digit flexion, however, continues to have difficulty securely holding and using deodorant. 12/09/2021: Bilateral hand/digit MP, PIP, and DIP extension tightness limits her ability achieve digit flexion to hold, and apply deodorant. 10/28/2021:  Pt. continues to have difficulty holding the deodorant. 01/12/2021: Pt. presents with limited  digit extension. Pt. Is able to initiate holding deodorant, however is unable to hold it while using it  Goal status:  Achieved   4.  Pt. Will improve bilateral wrist extension in preparation for anticipating, and initiating reaching for objects at the table.  Baseline: 11/30/23: Continue 11/02/23: Wrist extension: R: 30(38), L: -16(18) 07/13/23: Wrist extension: R: 50(64), L: -4(18) 05/25/23: Wrist extension: R: 46(64), L: -8(14) 04/04/23: R: 46(64) L: -10(10)01/19/2023:  R: 46(64) L: 0(20) 12/06/2022: Pt continues to to progress with bilateral wrist extension in preparation fro functional reaching. 10/27/2022: Pt. Is improving with bilateral wrist extension. R: 46(64) L: 0(20) 09/20/2022: limited PROM in the Left wrist extension 2/2 tightness/stiffness. 09/13/22: R: 55(68), L: 0(12) 07/19/2022: Bilateral wrist extension in  limited. 07/05/2022: Right: 40(60) Left: 0(12) 04/07/2022: Right: 40(55) Left: 5(18) 02/03/2022: Right: 34(55) Left: 3(4) 12/09/2021: Right  34(55)  Left  3(4) 10/28/2021: Right 22(38), Left 0(15) 09/14/2021:  Right: 17(35), left 2(15)  Goal status: Ongoing  7.  Pt. Will increase bilateral lateral pinch strength by 2 lbs to be able to securely grasp items during ADLs, and IADL tasks.  Baseline: 01/19/2023: Deferred as the Pinch meter is out for calibration.12/06/2022: Pt. Is able to more securely hold objects during ADLs/IADLs 10/27/2022: Pt. Is improving with holding items with her bilateral thumbs. (Pinch meter out for calibration) 09/20/2022: lateral pinch continues to be limited 09/13/22: R 6.5# L 4# 07/19/2022: TBD 07/05/2022: TBD 07/2022: NT-Pinch meter out for calibration. 02/03/2022: Right: 6#, Left: 4# 12/09/2021: Right: 6#, Left: 4#  Goal status: Deferred   9.  Pt. will complete plant care with modified independence.  Baseline:12/06/2022: Pt. Is independent with plant care in her husband's room, however is not able to access her plants due to furniture blocking access to them. 10/27/2022: Pt.  Is able to water plants that are closer, and within reach. Pt. continues to have difficulty reaching for thorough plant care. 09/20/2022: Pt. Continues to have difficulty reaching for thorough plant care. 09/13/22: continues to report intermittent difficulty 07/19/2022: Pt. Continues to be able to water, and care for some of her plants. Pt. Has more difficulty with plants that are harder to reach. 07/05/2022: Pt. is able to water, and care for some of her plants. Pt. Has more difficulty with plants that are harder to reach. 04/07/2022: Pt. is now able to hold a cup and water her plants.02/03/2022: Pt. has difficulty caring for her plants.  Goal status:Achieved   10.  Pt. will demonstrate adaptive techniques to assist with the efficiency of self-dressing, or morning care tasks.  Baseline: 07/13/23:Pt. continues to work on improving UE functioning to be able to efficiently use Adaptive equipment 05/25/2023: Pt. continues to work on improving UE functioning to be able to efficiently use Adaptive equipment.  04/04/2023:  Pt. continues to work on improving UE functioning to be able to efficiently use Adaptive equipment. 01/19/2203: Pt. Is able to donn her jacket in reverse independently. Pt. requires assist from staff, as Pt. Reports staff have decreased time. 12/06/2022: Pt. requires assist from staff, as Pt. Reports staff have decreased time. 10/27/2022: Pt. is able to assist with initiating UE dressing. Pt. requires assist from personal, and staff care aides. 09/20/2022: Pt. continues to require assist form personal, and staff care aides. 09/13/22: MODA dressing, reports she is often rushed 07/19/2022: Pt. continues to require assist with self-dressing/morning care tasks. 07/05/2022: Continue with goal. 04/07/2022: Pt. Continues to require assist with the efficiency of self-dressing, and morning care tasks.02/03/2022: Pt. requires assist from caregivers 2/2 time limitations during morning care.  Goal status:  Deferred- Has  caregiver assist and morning routine in place.  11.  Pt. will improve BUE strength to improve ADL, and IADL functioning.  Baseline: 11/30/2023: Continue 11/02/23: flexion L 4-/5, R 4-/5; shoulder abduction L 4-/5, R 4-/5. elbow flexion B 4/5,01/19/2023: L 4/5, R 3+/5  07/13/23: flexion L 4/5, R 4-/5; shoulder abduction L 4+/5, R 4-/5. elbow flexion B 5/5, elbow extension 4+/5 01/19/2023: L 4/5, R 3+/5  05/25/2023: shoulder flexion L 4/5, R 4-/5; shoulder abduction L 4+/5, R 4-/5. elbow flexion B 5/5, elbow extension 4+/5 01/19/2023: L 4/5, R 3+/5  04/04/2023: shoulder flexion L 4/5, R 3+/5; shoulder abduction L 4+/5, R 3+/5. elbow flexion B  5/5, elbow extension 4/5 01/19/2023: L 4/5, R 3+/5 12/06/2022: Continue 10/27/2022: shoulder flexion L 4/5, R 3+/5; shoulder abduction L 4+/5, R 3+/5. elbow flexion B 5/5, elbow extension 4/5 09/20/2022: Pt. Presents with limited BUE strength 09/13/22: shoulder flexion L 4/5, R 3/5; elbow flexion B 5/5, elbow extension 4-/5; shoulder abduction L 4+/5, R 3+/5. 07/19/2022: BUE strength continues to be limited. 07/05/2022: shoulder flexion: right 4-/5, abduction: 3+/5, elbow flexion: right: 5/5, left 5/5, extension: right: 4-/5, left 4-/5, wrist extension: right: 3-/5, left: 2-/5  Goal status: Ongoing  12.  Pt. will improve bilateral thumb radial abduction in order to be able to hold the grab bars while standing with PT  Baseline: 11/30/2023: Continue 11/02/23: Thumb radial abduction: R: 28(50), L:28(30) 07/13/23: Pt. Is improving with holding the grab bars with the right hand .05/25/23: Thumb radial abduction: R: 38(50), L:28(30) 04/04/23: R: 38(48), L:28(30) 01/19/2023: R: 38(48) L: 24(26)12/06/2022: Pt. Continues to present  with limited thumb abduction, however is improving holding onto the parallel bars.10/27/2022: thumb radial abduction: R: 30(42), L: 20(24  Goal status: Ongoing  13.  Pt. Will be able to securely hold, and stabilize medication bottles at a tabletop surface when  opening, and closing them.  Baseline: 01/19/2023: Pt. Is now able to securely hold medication bottles while opening them, and stabilizes them on surfaces while opening them. 12/06/2022: Pt is improving with securely stabilizing medication bottles. 10/27/2022: Pt. Stabilizes bottles against her torso when attempting to open, and close them   Goal status: Achieved  14: Pt. Will improve bilateral thumb IP flexion to improve active grasping patterns.    Baseline: 11/30/2023: Continue 11/02/23: Thumb IP flexion: R: 42(55) L: 10(25) 07/13/23: Thumb IP flexion: R: 42(50) L: 30(45) 05/25/2023: Thumb IP flexion: R: 40(50) L: 30(45) 04/04/2023:  R: 40(50) L: 25(45) 01/19/2023: Right 35(50), Left: 20/45)    Goal status: Ongoing    15. Pt. Will improve bilateral FMC/speed, and dexterity. As evidence by improved scores on the 9 hole peg test. Baseline: 11/30/2023: Limited bilateral fine motor coordination;continue 11/02/23: 2 min. & 51 sec. 07/13/2023: Right: 9 pegs placed and removed in 2 min. & 11 sec. 05/25/2023: 9 Hole pegs test speed/dexterity: Right: 9 pegs placed and removed in 2 min. & 41 sec. Left: Unable to grasp the pegs for a horizontal position. 04/04/23: Pt. was able to consistently grasp the pegs however each one slipped out of her fingers when attempting to place them into the Pegboard. 01/19/2023: 2 pegs placed in 1 min. & 5 sec.  Pt. was able to remove 9 pegs in 17 sec.; Left: 2 pegs placed in 1 min. & 10 sec. Pt. was able to remove 9 pegs in 20 sec.   Goal status: Ongoing    16. Pt. Will donn/doff a jacket with modified independence   Baseline: 11/30/2023: Continue 11/02/23: MaxA   Goal status: Ongoing     ASSESSMENT:  CLINICAL IMPRESSION:  Pt.'s BP 133/70 with HR 76 bpms. Pt. tolerated  all ROM and UE strengthening well with 2.5# dowel weight for 1-2 sets 10 reps each with cues initially for form and technique. Pt. continues to present with tightness/stiffness in the bilateral wrists, and digits  which limit he ability to engage her bilateral hands efficiently in ADL/IADL tasks. Pt. continues to benefit from OT services to work on impoving ROM, and UE strength in order to work towards increasing bilateral hand grasp on objects, and increasing engagement of bilateral hands during ADLs, and IADL tasks  PERFORMANCE DE PERFORMANCE DEFICITS: in functional skills including ADLs, IADLs, coordination, dexterity, ROM, strength, and UE functional use, cognitive skills including , and psychosocial skills including coping strategies and environmental adaptation.   IMPAIRMENTS: are limiting patient from ADLs, IADLs, and leisure.   CO-MORBIDITIES: may have co-morbidities  that affects occupational performance. Patient will benefit from skilled OT to address above impairments and improve overall function.  MODIFICATION OR ASSISTANCE TO COMPLETE EVALUATION: Min-Moderate modification of tasks or assist with assess necessary to complete an evaluation.  OT OCCUPATIONAL PROFILE AND HISTORY: Detailed assessment: Review of records and additional review of physical, cognitive, psychosocial history related to current functional performance.  CLINICAL DECISION MAKING: Moderate - several treatment options, min-mod task modification necessary  REHAB POTENTIAL: Good for stated goals  PLAN:  OT FREQUENCY 2x's a week  OT DURATION: 12 weeks  PLANNED INTERVENTIONS ADL training, A/E training, UE ther. Ex, Manual therapy, neuromuscular re-education, moist heat modality, Paraffin Bath, Splinting, and  Pt./caregiver education   RECOMMENDED OTHER SERVICES: PT  CONSULTED AND AGREED WITH PLAN OF CARE: Patient  PLAN FOR NEXT SESSION: Continue Treatment as per established POC  Richardson Otter, MS, OTR/L  01/04/24, 11:56 AM

## 2024-01-09 ENCOUNTER — Ambulatory Visit: Admitting: Occupational Therapy

## 2024-01-09 ENCOUNTER — Ambulatory Visit

## 2024-01-11 ENCOUNTER — Ambulatory Visit

## 2024-01-11 ENCOUNTER — Ambulatory Visit: Admitting: Occupational Therapy

## 2024-01-11 DIAGNOSIS — M6281 Muscle weakness (generalized): Secondary | ICD-10-CM

## 2024-01-11 DIAGNOSIS — R278 Other lack of coordination: Secondary | ICD-10-CM

## 2024-01-11 NOTE — Therapy (Signed)
 Occupational Therapy Neuro Treatment Note  Patient Name: Sherry [name] MRN: [id number] DOB:1934/07/31, 89 y.o., female Today's Date: 11/29/2022  PCP:  [facility] PROVIDER: Abdul Fine, MD  END OF SESSION:   OT End of Session - 01/11/24 1135     Visit Number [id number]    Number of Visits 192    Date for Recertification  01/25/24    OT Start Time 1100    OT Stop Time 1130    OT Time Calculation (min) 30 min    Equipment Utilized During Treatment powered wheelchair    Activity Tolerance Patient tolerated treatment well    Behavior During Therapy WFL for tasks assessed/performed              Past Medical History:  Diagnosis Date   Acute blood loss anemia    Arthritis    Cancer (HCC)    skin   Central cord syndrome at C6 level of cervical spinal cord (HCC) 11/29/2017   Hypertension    Protein-calorie malnutrition, severe (HCC) 01/24/2018   S/P insertion of IVC (inferior vena caval) filter 01/24/2018   Tetraparesis (HCC)    Past Surgical History:  Procedure Laterality Date   ANTERIOR CERVICAL DECOMP/DISCECTOMY FUSION N/A 11/29/2017   Procedure: Cervical five-six, six-seven Anterior Cervical Decompression Fusion;  Surgeon: Cheryle Debby DELENA, MD;  Location: [facility] OR;  Service: Neurosurgery;  Laterality: N/A;  Cervical five-six, six-seven Anterior Cervical Decompression Fusion   CATARACT EXTRACTION     FEMUR IM NAIL Left 08/16/2021   Procedure: INTRAMEDULLARY (IM) NAIL FEMORAL;  Surgeon: Cleotilde Barrio, MD;  Location: [facility] ORS;  Service: Orthopedics;  Laterality: Left;   IR IVC FILTER PLMT / S&I /IMG GUID/MOD SED  12/08/2017   Patient Active Problem List   Diagnosis Date Noted   Age-related osteoporosis without current pathological fracture 07/27/2022   Mild recurrent major depression (HCC) 07/27/2022   Hypertensive heart disease with other congestive heart failure (HCC) 02/14/2022   Chronic indwelling Foley catheter 02/14/2022   Closed hip fracture (HCC) 08/15/2021    DVT (deep venous thrombosis) (HCC) 08/15/2021   Quadriplegia (HCC) 08/15/2021   Fall 08/15/2021   Constipation due to slow transit 08/31/2018   Trauma 06/05/2018   Neuropathic pain 06/05/2018   Neurogenic bowel 06/05/2018   Vaginal yeast infection 01/30/2018   Healthcare-associated pneumonia 01/25/2018   Chronic allergic rhinitis 01/24/2018   Depression with anxiety 01/24/2018   UTI due to Klebsiella species 01/24/2018   Protein-calorie malnutrition, severe (HCC) 01/24/2018   S/P insertion of IVC (inferior vena caval) filter 01/24/2018   Tetraparesis (HCC) 01/20/2018   Neurogenic bladder 01/20/2018   Reactive depression    Benign essential HTN    Acute postoperative anemia due to expected blood loss    Central cord syndrome at C6 level of cervical spinal cord (HCC) 11/29/2017   Allergy to alpha-gal 11/25/2016   SCC (squamous cell carcinoma) 04/08/2014   ONSET DATE: 11/29/2017  REFERRING DIAG: Central Cord Syndrome at C6 of the Cervical Spinal Cord, Fall with Bilateral Closed Hip Fractures, with ORIF repair with Intramedullary Nailing of the Left Hip Fracture.   THERAPY DIAG:  Muscle weakness (generalized)  Other lack of coordination  Rationale for Evaluation and Treatment: Rehabilitation  SUBJECTIVE:  SUBJECTIVE STATEMENT:  Patient reports being sleepy this morning.  PERTINENT HISTORY: Central Cord Syndrome at C6 of the Cervical Spinal Cord, Fall with Bilateral Closed Hip Fractures, with ORIF repair with Intramedullary Nailing of the Left Hip Fracture.   PRECAUTIONS: None  WEIGHT BEARING RESTRICTIONS:  No  PAIN:  Are you having pain? No pain   LIVING ENVIRONMENT: Lives with: [facility] Stairs: No Has following equipment at home: Wheelchair (power)  PLOF: Independent   PATIENT GOALS:  See below for established goals  OBJECTIVE:  Note: Objective measures were completed at Evaluation unless otherwise noted.  HAND DOMINANCE:  Right  ADLs: Caregiver assist with ADLs, and [facility]  MOBILITY STATUS: Uses a power w/c  ACTIVITY TOLERANCE: Activity tolerance: Fair  FUNCTIONAL OUTCOME MEASURES:  Measurements:   12/09/2021:   Shoulder flexion: R: 120(136), L: 138(150) Shoulder abduction: R: 108(120), L: 110(120) Elbow: R: 0-148, L: 0-146 Wrist flexion: R: 55(62), L: 78 Writ extension: R: 34(55), L: 3(4) RD: R: 12(20), L: 16(26) UD: R: 8(24), L: 6(18) Thumb radial abduction: R: 11(20), L: 8(20) Digit flexion to the [facility]: R:  2nd: 5cm(3.5cm), 3rd: 7.5 cm(5cm), 4th: 7cm(3cm), 5th: 6cm(5.5cm) L: 2nd: 8cm(8cm), 3rd: 8cm(8cm), 4th: 7.5cm(7cm), 5th: 7cm(7cm)   02/03/2022:   Shoulder flexion: R: 122(138), L: 148(154) Shoulder abduction: R: 109(125), L: 125(125 Elbow: R: 0-148, L: 0-146 Wrist flexion: R: 60(68), L: 79 Writ extension: R: 40(55), L: 3(10) RD: R: 14(24), L: 20(28) UD: R: 8(24), L: 6(18) Thumb radial abduction: R: 20(25), L: 8(20) Digit flexion to the [facility]: R:  2nd: 4.5cm(3cm), 3rd: 6.5cm(4.5cm), 4th: 6.5 cm(3cm), 5th: 4.5cm(5cm) L: 2nd: 8cm(8cm), 3rd: 8cm(7cm), 4th: 7.5cm(7cm), 5th: 6.5cm(6.5cm)   04/07/2022:   Shoulder flexion: R: 125(150), L: 160(160) Shoulder abduction: R: 109(125), L: 125(125 Elbow: R: 0-148, L: 0-146 Wrist flexion: R: 60(68), L: 79 Writ extension: R: 40(55), L: 5(18) RD: R: 14(24), L: 20(28) UD: R: 18(24), L: 8(18) Thumb radial abduction: R: 20(25), L: 8(20) Digit flexion to the [facility]: R:  2nd: 4.5 cm(4 cm), 3rd: 6.5cm(3cm), 4th: 7 cm (5cm), 5th: 5cm(5cm) L: 2nd: 8cm(8cm), 3rd: 8cm(7cm), 4th: 7.5cm(7cm), 5th: 7cm(7cm)   07/05/2022:   Shoulder flexion: R: 125(154), L: 160(160) Shoulder abduction: R: 95(130), L: 143(143) Elbow: R: 0-148, L: 0-146 Wrist flexion: R: 60(68), L: 79 Writ extension: R: 40(60), L: 0(12) RD: R: 10(24), L: 12(20) UD: R: 16(24), L: 8(16) Thumb radial abduction: R: 20(25), L: 8(20) Digit flexion to the [facility]: R:  2nd: 4 cm(4cm), 3rd:  6.5cm( 3.5cm), 4th: 7 cm (5cm), 5th: 6cm(5cm) L: 2nd: 8cm(8cm), 3rd: 8cm(7cm), 4th: 8cm(7cm), 5th: 7cm(7cm)   09/13/2022:   Shoulder flexion: R: 125(154), L: 160(160) Shoulder abduction: R: 100(130), L: 143(143) Elbow: R: 0-150, L: 0-150 Wrist flexion: R: 55(68), L: 79 Writ extension: R: 45(60), L: 0(12) RD: R: 10(24), L: 12(20) UD: R: 16(24), L: 8(16) Thumb radial abduction: R: 20(25), L: 8(20) Digit flexion to the [facility]: R:  2nd: 4 cm(4cm), 3rd: 6cm( 5cm), 4th: 6 cm (4cm), 5th: 5cm(5cm) L: 2nd: 7cm(cm), 3rd: 7cm(6cm), 4th: 7cm(6cm), 5th: 6cm(6cm)   10/27/2022   Shoulder flexion: R: 128(154), L: 160(160) Shoulder abduction: R: 105(130), L: 143(143) Elbow: R: 0-150, L: 0-150 Wrist flexion: R: 55(68), L: 79 Writ extension: R: 46(64), L: 0(20) RD: R: 10(20), L: 18(24) UD: R: 18(28), L: 8(20) Thumb radial abduction: R: 30(42), L:20(24) Digit flexion to the [facility]: R:  2nd: 4 cm(3cm), 3rd: 6cm( 6cm), 4th: 6 cm (5cm), 5th: 5cm(5cm) L: 2nd: 8cm(8cm), 3rd: 8cm(6cm), 4th: 8cm(7cm), 5th: 6cm(6cm)  01/19/2023  Shoulder flexion: R: 131(154), L: 160(160) Shoulder abduction: R: 110(130), L: 143(143) Elbow: R: 0-150, L: 0-150 Wrist flexion: R: 55(68), L: 79 Writ extension: R: 46(64), L: 0(20) RD: R: 10(20), L: 18(24) UD: R: 18(28), L: 14(20)  Thumb radial abduction: R: 38(48), L:24(26) Thumb IP flexion: R: 35(50) L: 20(45) Digit flexion to the [facility]: R:  2nd: 3.5 cm(3cm), 3rd: 6cm( 6cm), 4th: 6 cm (5cm), 5th: 5cm(4.5cm) L: 2nd: 8cm(7.5cm), 3rd: 8cm(6cm), 4th: 7.5cm(6cm), 5th: 6cm(4.5cm)  04/04/2023  Shoulder flexion: R: 134(154), L: 160(160) Shoulder abduction: R: 114(130), L: 143(143) Elbow: R: WNL, L: WNL Wrist flexion: R: 55(68), L: 80 Writ extension: R: 46(64), L: -10(10) RD: R: 14(24), L: 20(24) UD: R: 20(30), L: 14(20) Thumb radial abduction: R: 38(48), L:28(30) Thumb IP flexion: R: 40(50) L: 25(45) Digit flexion to the [facility]: R:  2nd: 3.5 cm(3cm), 3rd: 6cm( 5cm), 4th: 6 cm  (4cm), 5th: 5cm(5cm) L: 2nd: 8cm(7.5cm), 3rd: 8cm(7cm), 4th: 7.5cm(7cm), 5th: 7cm(6cm)  05/25/2023  Shoulder flexion: R: 134(154), L: 160(160) Shoulder abduction: R: 117(134), L: 143(143) Elbow: R: WNL, L: WNL Wrist flexion: R: 55(68), L: 80 Wrist extension: R: 46(64), L: -8(14) RD: R: 14(24), L: 20(24) UD: R: 20(32), L: 14(22) Thumb radial abduction: R: 38(50), L:28(30) Thumb IP flexion: R: 40(50) L: 30(45) Digit flexion to the [facility]: R:  2nd: 3.5 cm(3cm), 3rd: 6cm( 5cm), 4th: 7 cm (4cm), 5th: 5cm(4cm) L: 2nd: 8cm(7.5cm), 3rd: 8cm(7cm), 4th: 7.5cm(7cm), 5th: 7cm(6cm)  07/13/23  Shoulder flexion: R: 134(154), L: 160(160) Shoulder abduction: R: 122(134), L: 143(143) Elbow: R: WNL, L: WNL Wrist flexion: R: 55(68), L: 80 Wrist extension: R: 50(64), L: -4(18) RD: R: 18(24), L: 24(24) UD: R: 20(32), L: 14(22) Thumb radial abduction: R: 38(50), L:32(30) Thumb IP flexion: R: 42(50) L: 30(45) Digit flexion to the [facility]: R:  2nd: 3.5 cm(3cm), 3rd: 6cm( 5cm), 4th: 7 cm (4cm), 5th: 5cm(4cm) L: 2nd: 8cm(7.5cm), 3rd: 8cm(7cm), 4th: 7.5cm(7cm), 5th: 7cm(6cm)  11/02/23:   Shoulder flexion: R: 136(154), L: 160(160) Shoulder abduction: R: 123(134), L: 143(143) Elbow: R: WNL, L: WNL Wrist flexion: R: 55(68), L: 80 Wrist extension: R: 30(38), L: -16(18) RD: R: 8(18), L: 18(24) UD: R: 12(24), L: 8(22) Thumb radial abduction: R: 28(50), L:28(30) Thumb IP flexion: R: 42(55) L: 10(25) Digit flexion to the [facility]: R:  2nd: 4.5cm(4cm), 3rd: 6cm( 4.5cm), 4th: 6.5cm (5cm), 5th: 5cm(4.5cm) L: 2nd: 8cm(7.5cm), 3rd: 7.5cm(7cm), 4th: 7cm(7cm), 5th: 6cm(6cm)    COORDINATION:   01/19/2023:  9 hole peg test:  Right: 2 pegs placed in 1 min. & 5 sec.  Pt. was able to remove 9 pegs in 17 sec.  Left: 2 pegs placed in 1 min. & 10 sec. Pt. was able to remove 9 pegs in 20 sec.  04/04/23:  Pt. was able to consistently grasp the pegs however each one slipped out of her fingers when attempting to place them  into the Pegboard.  05/25/2023:   Right: 9 pegs placed and removed in 2 min. & 41 sec. Left: Unable to grasp the pegs for a horizontal position.  07/13/23    Right: 9 pegs placed and removed in 2 min. & 11 sec.     11/02/23:                Right 9 Hole Peg Test: 2 min. & 51 sec.   SENSATION: Intact  EDEMA: N/A   COGNITION: Overall cognitive status: WNL   VISION: Subjective report: Wears glasses, No changes in vision.  PERCEPTION: Intact  TODAY'S TREATMENT:  DATE: 01/11/24  Refer to Clinical Impression       PATIENT EDUCATION: Education details: Bilateral hand  ROM, condition management, self-care: Condition Management: BP Person educated: Patient Education method: Explanation, Demonstration, and Verbal cues Education comprehension: verbalized understanding and returned demonstration  HOME EXERCISE PROGRAM: Continue to assess HEP needs, and provide as needed.   GOALS: Goals reviewed with patient? Yes  LONG TERM GOALS: Target date: 01/25/2024  3.  Patient will independently use a reacher to pick up items from various surfaces.  Baseline: 11/02/23: Pt. is now able to consistently pick up small objects from the floor with a reacher. 07/13/2023: Pt. is starting to use a reacher to retrieve items with an adaptation/modification to the trigger. 05/25/2023:  Pt. is now able to initiate grasping a reacher to open and close the reacher with the right hand, however requires the grasp handle to be repositioned  within the hand. Pt. requires work on using/moving the reacher with the right hand through various planes. 04/04/2023: Pt. continues to work on improving Digit flexion to the [facility] to be able to efficiently handle Adaptive equipment. 01/19/2023: Pt. Is improving with Bilateral digit flexion to the [facility]. Pt. has difficulty firmly holding adaptive devices.  12/06/2022: Pt. continues to present with increased MP, PIP, and DIP digit tightness/stiffness limiting the formulation of bilateral composites fists. 10/27/2022: Pt. presents with increased MP, PIP, and DIP digit tightness/stiffness limiting the formulation of bilateral composites fists during this progress reporting period limiting. Digit flexion to the [facility]: R:  2nd: 4 cm(3cm), 3rd: 6cm( 6cm), 4th: 6 cm (5cm), 5th: 5cm(5cm), L: 2nd: 8cm(8cm), 3rd: 8cm(6cm), 4th: 8cm(7cm), 5th: 6cm(6cm) 09/20/2022: Improving digit flexion to the [facility]. 09/13/22: improved digit flexion to the [facility]. 07/05/2022: Pt. presents with digit MP, PIP, and DIP extensor tightness limiting her ability to securely grip objects in her bilateral hands.  04/07/2022: Pt. Has improved with right 2nd, and 3rd digit flexion towards the [facility]. Pt. Continues to have difficulty securely holding and applying deodorant.02/03/2022: Pt. has improved with digit flexion, however, continues to have difficulty securely holding and using deodorant. 12/09/2021: Bilateral hand/digit MP, PIP, and DIP extension tightness limits her ability achieve digit flexion to hold, and apply deodorant. 10/28/2021:  Pt. continues to have difficulty holding the deodorant. 01/12/2021: Pt. presents with limited digit extension. Pt. Is able to initiate holding deodorant, however is unable to hold it while using it  Goal status:  Achieved   4.  Pt. Will improve bilateral wrist extension in preparation for anticipating, and initiating reaching for objects at the table.  Baseline: 11/30/23: Continue 11/02/23: Wrist extension: R: 30(38), L: -16(18) 07/13/23: Wrist extension: R: 50(64), L: -4(18) 05/25/23: Wrist extension: R: 46(64), L: -8(14) 04/04/23: R: 46(64) L: -10(10)01/19/2023:  R: 46(64) L: 0(20) 12/06/2022: Pt continues to to progress with bilateral wrist extension in preparation fro functional reaching. 10/27/2022: Pt. Is improving with bilateral wrist extension. R: 46(64) L: 0(20)  09/20/2022: limited PROM in the Left wrist extension 2/2 tightness/stiffness. 09/13/22: R: 55(68), L: 0(12) 07/19/2022: Bilateral wrist extension in limited. 07/05/2022: Right: 40(60) Left: 0(12) 04/07/2022: Right: 40(55) Left: 5(18) 02/03/2022: Right: 34(55) Left: 3(4) 12/09/2021: Right  34(55)  Left  3(4) 10/28/2021: Right 22(38), Left 0(15) 09/14/2021:  Right: 17(35), left 2(15)  Goal status: Ongoing  7.  Pt. Will increase bilateral lateral pinch strength by 2 lbs to be able to securely grasp items during ADLs, and IADL tasks.  Baseline: 01/19/2023: Deferred as the Pinch meter is out for calibration.12/06/2022: Pt. Is  able to more securely hold objects during ADLs/IADLs 10/27/2022: Pt. Is improving with holding items with her bilateral thumbs. (Pinch meter out for calibration) 09/20/2022: lateral pinch continues to be limited 09/13/22: R 6.5# L 4# 07/19/2022: TBD 07/05/2022: TBD 07/2022: NT-Pinch meter out for calibration. 02/03/2022: Right: 6#, Left: 4# 12/09/2021: Right: 6#, Left: 4#  Goal status: Deferred   9.  Pt. will complete plant care with modified independence.  Baseline:12/06/2022: Pt. Is independent with plant care in her husband's room, however is not able to access her plants due to furniture blocking access to them. 10/27/2022: Pt. Is able to water plants that are closer, and within reach. Pt. continues to have difficulty reaching for thorough plant care. 09/20/2022: Pt. Continues to have difficulty reaching for thorough plant care. 09/13/22: continues to report intermittent difficulty 07/19/2022: Pt. Continues to be able to water, and care for some of her plants. Pt. Has more difficulty with plants that are harder to reach. 07/05/2022: Pt. is able to water, and care for some of her plants. Pt. Has more difficulty with plants that are harder to reach. 04/07/2022: Pt. is now able to hold a cup and water her plants.02/03/2022: Pt. has difficulty caring for her plants.  Goal status:Achieved   10.  Pt. will  demonstrate adaptive techniques to assist with the efficiency of self-dressing, or morning care tasks.  Baseline: 07/13/23:Pt. continues to work on improving UE functioning to be able to efficiently use Adaptive equipment 05/25/2023: Pt. continues to work on improving UE functioning to be able to efficiently use Adaptive equipment.  04/04/2023:  Pt. continues to work on improving UE functioning to be able to efficiently use Adaptive equipment. 01/19/2203: Pt. Is able to donn her jacket in reverse independently. Pt. requires assist from staff, as Pt. Reports staff have decreased time. 12/06/2022: Pt. requires assist from staff, as Pt. Reports staff have decreased time. 10/27/2022: Pt. is able to assist with initiating UE dressing. Pt. requires assist from personal, and staff care aides. 09/20/2022: Pt. continues to require assist form personal, and staff care aides. 09/13/22: MODA dressing, reports she is often rushed 07/19/2022: Pt. continues to require assist with self-dressing/morning care tasks. 07/05/2022: Continue with goal. 04/07/2022: Pt. Continues to require assist with the efficiency of self-dressing, and morning care tasks.02/03/2022: Pt. requires assist from caregivers 2/2 time limitations during morning care.  Goal status:  Deferred- Has caregiver assist and morning routine in place.  11.  Pt. will improve BUE strength to improve ADL, and IADL functioning.  Baseline: 11/30/2023: Continue 11/02/23: flexion L 4-/5, R 4-/5; shoulder abduction L 4-/5, R 4-/5. elbow flexion B 4/5,01/19/2023: L 4/5, R 3+/5  07/13/23: flexion L 4/5, R 4-/5; shoulder abduction L 4+/5, R 4-/5. elbow flexion B 5/5, elbow extension 4+/5 01/19/2023: L 4/5, R 3+/5  05/25/2023: shoulder flexion L 4/5, R 4-/5; shoulder abduction L 4+/5, R 4-/5. elbow flexion B 5/5, elbow extension 4+/5 01/19/2023: L 4/5, R 3+/5  04/04/2023: shoulder flexion L 4/5, R 3+/5; shoulder abduction L 4+/5, R 3+/5. elbow flexion B 5/5, elbow extension 4/5 01/19/2023: L  4/5, R 3+/5 12/06/2022: Continue 10/27/2022: shoulder flexion L 4/5, R 3+/5; shoulder abduction L 4+/5, R 3+/5. elbow flexion B 5/5, elbow extension 4/5 09/20/2022: Pt. Presents with limited BUE strength 09/13/22: shoulder flexion L 4/5, R 3/5; elbow flexion B 5/5, elbow extension 4-/5; shoulder abduction L 4+/5, R 3+/5. 07/19/2022: BUE strength continues to be limited. 07/05/2022: shoulder flexion: right 4-/5, abduction: 3+/5, elbow flexion: right: 5/5, left 5/5,  extension: right: 4-/5, left 4-/5, wrist extension: right: 3-/5, left: 2-/5  Goal status: Ongoing  12.  Pt. will improve bilateral thumb radial abduction in order to be able to hold the grab bars while standing with PT  Baseline: 11/30/2023: Continue 11/02/23: Thumb radial abduction: R: 28(50), L:28(30) 07/13/23: Pt. Is improving with holding the grab bars with the right hand .05/25/23: Thumb radial abduction: R: 38(50), L:28(30) 04/04/23: R: 38(48), L:28(30) 01/19/2023: R: 38(48) L: 24(26)12/06/2022: Pt. Continues to present  with limited thumb abduction, however is improving holding onto the parallel bars.10/27/2022: thumb radial abduction: R: 30(42), L: 20(24  Goal status: Ongoing  13.  Pt. Will be able to securely hold, and stabilize medication bottles at a tabletop surface when opening, and closing them.  Baseline: 01/19/2023: Pt. Is now able to securely hold medication bottles while opening them, and stabilizes them on surfaces while opening them. 12/06/2022: Pt is improving with securely stabilizing medication bottles. 10/27/2022: Pt. Stabilizes bottles against her torso when attempting to open, and close them   Goal status: Achieved  14: Pt. Will improve bilateral thumb IP flexion to improve active grasping patterns.    Baseline: 11/30/2023: Continue 11/02/23: Thumb IP flexion: R: 42(55) L: 10(25) 07/13/23: Thumb IP flexion: R: 42(50) L: 30(45) 05/25/2023: Thumb IP flexion: R: 40(50) L: 30(45) 04/04/2023:  R: 40(50) L: 25(45) 01/19/2023: Right  35(50), Left: 20/45)    Goal status: Ongoing    15. Pt. Will improve bilateral FMC/speed, and dexterity. As evidence by improved scores on the 9 hole peg test. Baseline: 11/30/2023: Limited bilateral fine motor coordination;continue 11/02/23: 2 min. & 51 sec. 07/13/2023: Right: 9 pegs placed and removed in 2 min. & 11 sec. 05/25/2023: 9 Hole pegs test speed/dexterity: Right: 9 pegs placed and removed in 2 min. & 41 sec. Left: Unable to grasp the pegs for a horizontal position. 04/04/23: Pt. was able to consistently grasp the pegs however each one slipped out of her fingers when attempting to place them into the Pegboard. 01/19/2023: 2 pegs placed in 1 min. & 5 sec.  Pt. was able to remove 9 pegs in 17 sec.; Left: 2 pegs placed in 1 min. & 10 sec. Pt. was able to remove 9 pegs in 20 sec.   Goal status: Ongoing    16. Pt. Will donn/doff a jacket with modified independence   Baseline: 11/30/2023: Continue 11/02/23: MaxA   Goal status: Ongoing     ASSESSMENT:  CLINICAL IMPRESSION:  Upon arrival Pt.'s BP was 87/47 HR 70 bpms, After having Gatorade, Pt.'s was reclined, and LEs were elevated.  BP was reassessed 80/42 with HR 68 bpms. Pt.'s position was further reclined with LE's further elevated. BP 86/49 with HR 66 bpms. Pt. declined to have rapid response intervention services called, and declined to go to the ER for further assessment. Pt. requested to return to [facility] to be assessed there. Pt.'s personal care aide called for [facility] transportation services which arrived promptly.  Pt. continues to benefit from OT services to work on impoving ROM, and UE strength in order to work towards increasing bilateral hand grasp on objects, and increasing engagement of bilateral hands during ADLs, and IADL tasks    PERFORMANCE DE PERFORMANCE DEFICITS: in functional skills including ADLs, IADLs, coordination, dexterity, ROM, strength, and UE functional use, cognitive skills including , and psychosocial skills  including coping strategies and environmental adaptation.   IMPAIRMENTS: are limiting patient from ADLs, IADLs, and leisure.   CO-MORBIDITIES: may have co-morbidities  that affects occupational performance. Patient will benefit from skilled OT to address above impairments and improve overall function.  MODIFICATION OR ASSISTANCE TO COMPLETE EVALUATION: Min-Moderate modification of tasks or assist with assess necessary to complete an evaluation.  OT OCCUPATIONAL PROFILE AND HISTORY: Detailed assessment: Review of records and additional review of physical, cognitive, psychosocial history related to current functional performance.  CLINICAL DECISION MAKING: Moderate - several treatment options, min-mod task modification necessary  REHAB POTENTIAL: Good for stated goals  PLAN:  OT FREQUENCY 2x's a week  OT DURATION: 12 weeks  PLANNED INTERVENTIONS ADL training, A/E training, UE ther. Ex, Manual therapy, neuromuscular re-education, moist heat modality, Paraffin Bath, Splinting, and  Pt./caregiver education   RECOMMENDED OTHER SERVICES: PT  CONSULTED AND AGREED WITH PLAN OF CARE: Patient  PLAN FOR NEXT SESSION: Continue Treatment as per established POC  Richardson Otter, MS, OTR/L  01/11/24, 11:42 AM

## 2024-01-16 ENCOUNTER — Ambulatory Visit: Admitting: Occupational Therapy

## 2024-01-16 ENCOUNTER — Ambulatory Visit

## 2024-01-16 DIAGNOSIS — M6281 Muscle weakness (generalized): Secondary | ICD-10-CM

## 2024-01-16 NOTE — Therapy (Signed)
 Occupational Therapy Neuro Treatment Note  Patient Name: Sherry [name] MRN: [id number] DOB:1934/07/08, 89 y.o., female Today's Date: 11/29/2022  PCP:  [facility] PROVIDER: Abdul Fine, MD  END OF SESSION:   OT End of Session - 01/16/24 1214     Visit Number 148    Number of Visits 192    Date for Recertification  01/25/24    OT Start Time 1100    OT Stop Time 1145    OT Time Calculation (min) 45 min    Activity Tolerance Patient tolerated treatment well    Behavior During Therapy [facility] for tasks assessed/performed              Past Medical History:  Diagnosis Date   Acute blood loss anemia    Arthritis    Cancer (HCC)    skin   Central cord syndrome at C6 level of cervical spinal cord (HCC) 11/29/2017   Hypertension    Protein-calorie malnutrition, severe (HCC) 01/24/2018   S/P insertion of IVC (inferior vena caval) filter 01/24/2018   Tetraparesis (HCC)    Past Surgical History:  Procedure Laterality Date   ANTERIOR CERVICAL DECOMP/DISCECTOMY FUSION N/A 11/29/2017   Procedure: Cervical five-six, six-seven Anterior Cervical Decompression Fusion;  Surgeon: Cheryle Debby DELENA, MD;  Location: [facility] OR;  Service: Neurosurgery;  Laterality: N/A;  Cervical five-six, six-seven Anterior Cervical Decompression Fusion   CATARACT EXTRACTION     FEMUR IM NAIL Left 08/16/2021   Procedure: INTRAMEDULLARY (IM) NAIL FEMORAL;  Surgeon: Cleotilde Barrio, MD;  Location: [facility] ORS;  Service: Orthopedics;  Laterality: Left;   IR IVC FILTER PLMT / S&I /IMG GUID/MOD SED  12/08/2017   Patient Active Problem List   Diagnosis Date Noted   Age-related osteoporosis without current pathological fracture 07/27/2022   Mild recurrent major depression (HCC) 07/27/2022   Hypertensive heart disease with other congestive heart failure (HCC) 02/14/2022   Chronic indwelling Foley catheter 02/14/2022   Closed hip fracture (HCC) 08/15/2021   DVT (deep venous thrombosis) (HCC) 08/15/2021    Quadriplegia (HCC) 08/15/2021   Fall 08/15/2021   Constipation due to slow transit 08/31/2018   Trauma 06/05/2018   Neuropathic pain 06/05/2018   Neurogenic bowel 06/05/2018   Vaginal yeast infection 01/30/2018   Healthcare-associated pneumonia 01/25/2018   Chronic allergic rhinitis 01/24/2018   Depression with anxiety 01/24/2018   UTI due to Klebsiella species 01/24/2018   Protein-calorie malnutrition, severe (HCC) 01/24/2018   S/P insertion of IVC (inferior vena caval) filter 01/24/2018   Tetraparesis (HCC) 01/20/2018   Neurogenic bladder 01/20/2018   Reactive depression    Benign essential HTN    Acute postoperative anemia due to expected blood loss    Central cord syndrome at C6 level of cervical spinal cord (HCC) 11/29/2017   Allergy to alpha-gal 11/25/2016   SCC (squamous cell carcinoma) 04/08/2014   ONSET DATE: 11/29/2017  REFERRING DIAG: Central Cord Syndrome at C6 of the Cervical Spinal Cord, Fall with Bilateral Closed Hip Fractures, with ORIF repair with Intramedullary Nailing of the Left Hip Fracture.   THERAPY DIAG:  Muscle weakness (generalized)  Other lack of coordination  Rationale for Evaluation and Treatment: Rehabilitation  SUBJECTIVE:  SUBJECTIVE STATEMENT:  Patient reports feeling good this morning.  PERTINENT HISTORY: Central Cord Syndrome at C6 of the Cervical Spinal Cord, Fall with Bilateral Closed Hip Fractures, with ORIF repair with Intramedullary Nailing of the Left Hip Fracture.   PRECAUTIONS: None  WEIGHT BEARING RESTRICTIONS: No  PAIN:  Are you having pain? No  pain   LIVING ENVIRONMENT: Lives with: [facility] Stairs: No Has following equipment at home: Wheelchair (power)  PLOF: Independent   PATIENT GOALS:  See below for established goals  OBJECTIVE:  Note: Objective measures were completed at Evaluation unless otherwise noted.  HAND DOMINANCE: Right  ADLs: Caregiver assist with ADLs, and [place]  [facility]  MOBILITY STATUS: Uses a power w/c  ACTIVITY TOLERANCE: Activity tolerance: Fair  FUNCTIONAL OUTCOME MEASURES:  Measurements:   12/09/2021:   Shoulder flexion: R: 120(136), L: 138(150) Shoulder abduction: R: 108(120), L: 110(120) Elbow: R: 0-148, L: 0-146 Wrist flexion: R: 55(62), L: 78 Writ extension: R: 34(55), L: 3(4) RD: R: 12(20), L: 16(26) UD: R: 8(24), L: 6(18) Thumb radial abduction: R: 11(20), L: 8(20) Digit flexion to the [facility]: R:  2nd: 5cm(3.5cm), 3rd: 7.5 cm(5cm), 4th: 7cm(3cm), 5th: 6cm(5.5cm) L: 2nd: 8cm(8cm), 3rd: 8cm(8cm), 4th: 7.5cm(7cm), 5th: 7cm(7cm)   02/03/2022:   Shoulder flexion: R: 122(138), L: 148(154) Shoulder abduction: R: 109(125), L: 125(125 Elbow: R: 0-148, L: 0-146 Wrist flexion: R: 60(68), L: 79 Writ extension: R: 40(55), L: 3(10) RD: R: 14(24), L: 20(28) UD: R: 8(24), L: 6(18) Thumb radial abduction: R: 20(25), L: 8(20) Digit flexion to the [facility]: R:  2nd: 4.5cm(3cm), 3rd: 6.5cm(4.5cm), 4th: 6.5 cm(3cm), 5th: 4.5cm(5cm) L: 2nd: 8cm(8cm), 3rd: 8cm(7cm), 4th: 7.5cm(7cm), 5th: 6.5cm(6.5cm)   04/07/2022:   Shoulder flexion: R: 125(150), L: 160(160) Shoulder abduction: R: 109(125), L: 125(125 Elbow: R: 0-148, L: 0-146 Wrist flexion: R: 60(68), L: 79 Writ extension: R: 40(55), L: 5(18) RD: R: 14(24), L: 20(28) UD: R: 18(24), L: 8(18) Thumb radial abduction: R: 20(25), L: 8(20) Digit flexion to the [facility]: R:  2nd: 4.5 cm(4 cm), 3rd: 6.5cm(3cm), 4th: 7 cm (5cm), 5th: 5cm(5cm) L: 2nd: 8cm(8cm), 3rd: 8cm(7cm), 4th: 7.5cm(7cm), 5th: 7cm(7cm)   07/05/2022:   Shoulder flexion: R: 125(154), L: 160(160) Shoulder abduction: R: 95(130), L: 143(143) Elbow: R: 0-148, L: 0-146 Wrist flexion: R: 60(68), L: 79 Writ extension: R: 40(60), L: 0(12) RD: R: 10(24), L: 12(20) UD: R: 16(24), L: 8(16) Thumb radial abduction: R: 20(25), L: 8(20) Digit flexion to the [facility]: R:  2nd: 4 cm(4cm), 3rd: 6.5cm( 3.5cm), 4th: 7 cm (5cm), 5th: 6cm(5cm) L: 2nd:  8cm(8cm), 3rd: 8cm(7cm), 4th: 8cm(7cm), 5th: 7cm(7cm)   09/13/2022:   Shoulder flexion: R: 125(154), L: 160(160) Shoulder abduction: R: 100(130), L: 143(143) Elbow: R: 0-150, L: 0-150 Wrist flexion: R: 55(68), L: 79 Writ extension: R: 45(60), L: 0(12) RD: R: 10(24), L: 12(20) UD: R: 16(24), L: 8(16) Thumb radial abduction: R: 20(25), L: 8(20) Digit flexion to the [facility]: R:  2nd: 4 cm(4cm), 3rd: 6cm( 5cm), 4th: 6 cm (4cm), 5th: 5cm(5cm) L: 2nd: 7cm(cm), 3rd: 7cm(6cm), 4th: 7cm(6cm), 5th: 6cm(6cm)   10/27/2022   Shoulder flexion: R: 128(154), L: 160(160) Shoulder abduction: R: 105(130), L: 143(143) Elbow: R: 0-150, L: 0-150 Wrist flexion: R: 55(68), L: 79 Writ extension: R: 46(64), L: 0(20) RD: R: 10(20), L: 18(24) UD: R: 18(28), L: 8(20) Thumb radial abduction: R: 30(42), L:20(24) Digit flexion to the [facility]: R:  2nd: 4 cm(3cm), 3rd: 6cm( 6cm), 4th: 6 cm (5cm), 5th: 5cm(5cm) L: 2nd: 8cm(8cm), 3rd: 8cm(6cm), 4th: 8cm(7cm), 5th: 6cm(6cm)  01/19/2023  Shoulder flexion: R: 131(154), L: 160(160) Shoulder abduction: R: 110(130), L: 143(143) Elbow: R: 0-150, L: 0-150 Wrist flexion: R: 55(68), L: 79 Writ extension: R: 46(64), L: 0(20) RD: R: 10(20), L: 18(24) UD: R: 18(28), L: 14(20) Thumb radial abduction: R: 38(48), L:24(26) Thumb IP flexion:  R: 35(50) L: 20(45) Digit flexion to the [facility]: R:  2nd: 3.5 cm(3cm), 3rd: 6cm( 6cm), 4th: 6 cm (5cm), 5th: 5cm(4.5cm) L: 2nd: 8cm(7.5cm), 3rd: 8cm(6cm), 4th: 7.5cm(6cm), 5th: 6cm(4.5cm)  04/04/2023  Shoulder flexion: R: 134(154), L: 160(160) Shoulder abduction: R: 114(130), L: 143(143) Elbow: R: WNL, L: WNL Wrist flexion: R: 55(68), L: 80 Writ extension: R: 46(64), L: -10(10) RD: R: 14(24), L: 20(24) UD: R: 20(30), L: 14(20) Thumb radial abduction: R: 38(48), L:28(30) Thumb IP flexion: R: 40(50) L: 25(45) Digit flexion to the [facility]: R:  2nd: 3.5 cm(3cm), 3rd: 6cm( 5cm), 4th: 6 cm (4cm), 5th: 5cm(5cm) L: 2nd: 8cm(7.5cm), 3rd: 8cm(7cm),  4th: 7.5cm(7cm), 5th: 7cm(6cm)  05/25/2023  Shoulder flexion: R: 134(154), L: 160(160) Shoulder abduction: R: 117(134), L: 143(143) Elbow: R: WNL, L: WNL Wrist flexion: R: 55(68), L: 80 Wrist extension: R: 46(64), L: -8(14) RD: R: 14(24), L: 20(24) UD: R: 20(32), L: 14(22) Thumb radial abduction: R: 38(50), L:28(30) Thumb IP flexion: R: 40(50) L: 30(45) Digit flexion to the [facility]: R:  2nd: 3.5 cm(3cm), 3rd: 6cm( 5cm), 4th: 7 cm (4cm), 5th: 5cm(4cm) L: 2nd: 8cm(7.5cm), 3rd: 8cm(7cm), 4th: 7.5cm(7cm), 5th: 7cm(6cm)  07/13/23  Shoulder flexion: R: 134(154), L: 160(160) Shoulder abduction: R: 122(134), L: 143(143) Elbow: R: WNL, L: WNL Wrist flexion: R: 55(68), L: 80 Wrist extension: R: 50(64), L: -4(18) RD: R: 18(24), L: 24(24) UD: R: 20(32), L: 14(22) Thumb radial abduction: R: 38(50), L:32(30) Thumb IP flexion: R: 42(50) L: 30(45) Digit flexion to the [facility]: R:  2nd: 3.5 cm(3cm), 3rd: 6cm( 5cm), 4th: 7 cm (4cm), 5th: 5cm(4cm) L: 2nd: 8cm(7.5cm), 3rd: 8cm(7cm), 4th: 7.5cm(7cm), 5th: 7cm(6cm)  11/02/23:   Shoulder flexion: R: 136(154), L: 160(160) Shoulder abduction: R: 123(134), L: 143(143) Elbow: R: WNL, L: WNL Wrist flexion: R: 55(68), L: 80 Wrist extension: R: 30(38), L: -16(18) RD: R: 8(18), L: 18(24) UD: R: 12(24), L: 8(22) Thumb radial abduction: R: 28(50), L:28(30) Thumb IP flexion: R: 42(55) L: 10(25) Digit flexion to the [facility]: R:  2nd: 4.5cm(4cm), 3rd: 6cm( 4.5cm), 4th: 6.5cm (5cm), 5th: 5cm(4.5cm) L: 2nd: 8cm(7.5cm), 3rd: 7.5cm(7cm), 4th: 7cm(7cm), 5th: 6cm(6cm)    COORDINATION:   01/19/2023:  9 hole peg test:  Right: 2 pegs placed in 1 min. & 5 sec.  Pt. was able to remove 9 pegs in 17 sec.  Left: 2 pegs placed in 1 min. & 10 sec. Pt. was able to remove 9 pegs in 20 sec.  04/04/23:  Pt. was able to consistently grasp the pegs however each one slipped out of her fingers when attempting to place them into the Pegboard.  05/25/2023:   Right: 9 pegs placed  and removed in 2 min. & 41 sec. Left: Unable to grasp the pegs for a horizontal position.  07/13/23    Right: 9 pegs placed and removed in 2 min. & 11 sec.     11/02/23:                Right 9 Hole Peg Test: 2 min. & 51 sec.   SENSATION: Intact  EDEMA: N/A   COGNITION: Overall cognitive status: WNL   VISION: Subjective report: Wears glasses, No changes in vision.  PERCEPTION: Intact  TODAY'S TREATMENT:  DATE: 01/16/24   Manual Therapy:   -Pt. tolerated metacarpal spread stretches.  -Manual therapy was performed independent of, and in preparation for therapeutic Ex.    Therapeutic Ex.:    -Pt. tolerated PROM with slow prolonged gentle stretching to the bilateral wrist extension with increased focus on left wrist extension. AAROM/PROM for bilateral digit MP, PIP, and DIP flexion, and extension, thumb abduction was performed in conjunction with moist heat modality 2/2 increased extensor tone, and tightness in the left wrist, and bilateral digits.      PATIENT EDUCATION: Education details: Bilateral hand  ROM, condition management, self-care: Condition Management: BP Person educated: Patient Education method: Explanation, Demonstration, and Verbal cues Education comprehension: verbalized understanding and returned demonstration  HOME EXERCISE PROGRAM: Continue to assess HEP needs, and provide as needed.   GOALS: Goals reviewed with patient? Yes  LONG TERM GOALS: Target date: 01/25/2024  3.  Patient will independently use a reacher to pick up items from various surfaces.  Baseline: 11/02/23: Pt. is now able to consistently pick up small objects from the floor with a reacher. 07/13/2023: Pt. is starting to use a reacher to retrieve items with an adaptation/modification to the trigger. 05/25/2023:  Pt. is now able to initiate grasping a reacher to open  and close the reacher with the right hand, however requires the grasp handle to be repositioned  within the hand. Pt. requires work on using/moving the reacher with the right hand through various planes. 04/04/2023: Pt. continues to work on improving Digit flexion to the [facility] to be able to efficiently handle Adaptive equipment. 01/19/2023: Pt. Is improving with Bilateral digit flexion to the [facility]. Pt. has difficulty firmly holding adaptive devices. 12/06/2022: Pt. continues to present with increased MP, PIP, and DIP digit tightness/stiffness limiting the formulation of bilateral composites fists. 10/27/2022: Pt. presents with increased MP, PIP, and DIP digit tightness/stiffness limiting the formulation of bilateral composites fists during this progress reporting period limiting. Digit flexion to the [facility]: R:  2nd: 4 cm(3cm), 3rd: 6cm( 6cm), 4th: 6 cm (5cm), 5th: 5cm(5cm), L: 2nd: 8cm(8cm), 3rd: 8cm(6cm), 4th: 8cm(7cm), 5th: 6cm(6cm) 09/20/2022: Improving digit flexion to the [facility]. 09/13/22: improved digit flexion to the [facility]. 07/05/2022: Pt. presents with digit MP, PIP, and DIP extensor tightness limiting her ability to securely grip objects in her bilateral hands.  04/07/2022: Pt. Has improved with right 2nd, and 3rd digit flexion towards the [facility]. Pt. Continues to have difficulty securely holding and applying deodorant.02/03/2022: Pt. has improved with digit flexion, however, continues to have difficulty securely holding and using deodorant. 12/09/2021: Bilateral hand/digit MP, PIP, and DIP extension tightness limits her ability achieve digit flexion to hold, and apply deodorant. 10/28/2021:  Pt. continues to have difficulty holding the deodorant. 01/12/2021: Pt. presents with limited digit extension. Pt. Is able to initiate holding deodorant, however is unable to hold it while using it  Goal status:  Achieved   4.  Pt. Will improve bilateral wrist extension in preparation for anticipating, and initiating reaching for  objects at the table.  Baseline: 11/30/23: Continue 11/02/23: Wrist extension: R: 30(38), L: -16(18) 07/13/23: Wrist extension: R: 50(64), L: -4(18) 05/25/23: Wrist extension: R: 46(64), L: -8(14) 04/04/23: R: 46(64) L: -10(10)01/19/2023:  R: 46(64) L: 0(20) 12/06/2022: Pt continues to to progress with bilateral wrist extension in preparation fro functional reaching. 10/27/2022: Pt. Is improving with bilateral wrist extension. R: 46(64) L: 0(20) 09/20/2022: limited PROM in the Left wrist extension 2/2 tightness/stiffness. 09/13/22: R: 55(68), L: 0(12) 07/19/2022: Bilateral wrist  extension in limited. 07/05/2022: Right: 40(60) Left: 0(12) 04/07/2022: Right: 40(55) Left: 5(18) 02/03/2022: Right: 34(55) Left: 3(4) 12/09/2021: Right  34(55)  Left  3(4) 10/28/2021: Right 22(38), Left 0(15) 09/14/2021:  Right: 17(35), left 2(15)  Goal status: Ongoing  7.  Pt. Will increase bilateral lateral pinch strength by 2 lbs to be able to securely grasp items during ADLs, and IADL tasks.  Baseline: 01/19/2023: Deferred as the Pinch meter is out for calibration.12/06/2022: Pt. Is able to more securely hold objects during ADLs/IADLs 10/27/2022: Pt. Is improving with holding items with her bilateral thumbs. (Pinch meter out for calibration) 09/20/2022: lateral pinch continues to be limited 09/13/22: R 6.5# L 4# 07/19/2022: TBD 07/05/2022: TBD 07/2022: NT-Pinch meter out for calibration. 02/03/2022: Right: 6#, Left: 4# 12/09/2021: Right: 6#, Left: 4#  Goal status: Deferred   9.  Pt. will complete plant care with modified independence.  Baseline:12/06/2022: Pt. Is independent with plant care in her husband's room, however is not able to access her plants due to furniture blocking access to them. 10/27/2022: Pt. Is able to water plants that are closer, and within reach. Pt. continues to have difficulty reaching for thorough plant care. 09/20/2022: Pt. Continues to have difficulty reaching for thorough plant care. 09/13/22: continues to report  intermittent difficulty 07/19/2022: Pt. Continues to be able to water, and care for some of her plants. Pt. Has more difficulty with plants that are harder to reach. 07/05/2022: Pt. is able to water, and care for some of her plants. Pt. Has more difficulty with plants that are harder to reach. 04/07/2022: Pt. is now able to hold a cup and water her plants.02/03/2022: Pt. has difficulty caring for her plants.  Goal status:Achieved   10.  Pt. will demonstrate adaptive techniques to assist with the efficiency of self-dressing, or morning care tasks.  Baseline: 07/13/23:Pt. continues to work on improving UE functioning to be able to efficiently use Adaptive equipment 05/25/2023: Pt. continues to work on improving UE functioning to be able to efficiently use Adaptive equipment.  04/04/2023:  Pt. continues to work on improving UE functioning to be able to efficiently use Adaptive equipment. 01/19/2203: Pt. Is able to donn her jacket in reverse independently. Pt. requires assist from staff, as Pt. Reports staff have decreased time. 12/06/2022: Pt. requires assist from staff, as Pt. Reports staff have decreased time. 10/27/2022: Pt. is able to assist with initiating UE dressing. Pt. requires assist from personal, and staff care aides. 09/20/2022: Pt. continues to require assist form personal, and staff care aides. 09/13/22: MODA dressing, reports she is often rushed 07/19/2022: Pt. continues to require assist with self-dressing/morning care tasks. 07/05/2022: Continue with goal. 04/07/2022: Pt. Continues to require assist with the efficiency of self-dressing, and morning care tasks.02/03/2022: Pt. requires assist from caregivers 2/2 time limitations during morning care.  Goal status:  Deferred- Has caregiver assist and morning routine in place.  11.  Pt. will improve BUE strength to improve ADL, and IADL functioning.  Baseline: 11/30/2023: Continue 11/02/23: flexion L 4-/5, R 4-/5; shoulder abduction L 4-/5, R 4-/5. elbow flexion  B 4/5,01/19/2023: L 4/5, R 3+/5  07/13/23: flexion L 4/5, R 4-/5; shoulder abduction L 4+/5, R 4-/5. elbow flexion B 5/5, elbow extension 4+/5 01/19/2023: L 4/5, R 3+/5  05/25/2023: shoulder flexion L 4/5, R 4-/5; shoulder abduction L 4+/5, R 4-/5. elbow flexion B 5/5, elbow extension 4+/5 01/19/2023: L 4/5, R 3+/5  04/04/2023: shoulder flexion L 4/5, R 3+/5; shoulder abduction L 4+/5, R 3+/5. elbow  flexion B 5/5, elbow extension 4/5 01/19/2023: L 4/5, R 3+/5 12/06/2022: Continue 10/27/2022: shoulder flexion L 4/5, R 3+/5; shoulder abduction L 4+/5, R 3+/5. elbow flexion B 5/5, elbow extension 4/5 09/20/2022: Pt. Presents with limited BUE strength 09/13/22: shoulder flexion L 4/5, R 3/5; elbow flexion B 5/5, elbow extension 4-/5; shoulder abduction L 4+/5, R 3+/5. 07/19/2022: BUE strength continues to be limited. 07/05/2022: shoulder flexion: right 4-/5, abduction: 3+/5, elbow flexion: right: 5/5, left 5/5, extension: right: 4-/5, left 4-/5, wrist extension: right: 3-/5, left: 2-/5  Goal status: Ongoing  12.  Pt. will improve bilateral thumb radial abduction in order to be able to hold the grab bars while standing with PT  Baseline: 11/30/2023: Continue 11/02/23: Thumb radial abduction: R: 28(50), L:28(30) 07/13/23: Pt. Is improving with holding the grab bars with the right hand .05/25/23: Thumb radial abduction: R: 38(50), L:28(30) 04/04/23: R: 38(48), L:28(30) 01/19/2023: R: 38(48) L: 24(26)12/06/2022: Pt. Continues to present  with limited thumb abduction, however is improving holding onto the parallel bars.10/27/2022: thumb radial abduction: R: 30(42), L: 20(24  Goal status: Ongoing  13.  Pt. Will be able to securely hold, and stabilize medication bottles at a tabletop surface when opening, and closing them.  Baseline: 01/19/2023: Pt. Is now able to securely hold medication bottles while opening them, and stabilizes them on surfaces while opening them. 12/06/2022: Pt is improving with securely stabilizing medication  bottles. 10/27/2022: Pt. Stabilizes bottles against her torso when attempting to open, and close them   Goal status: Achieved  14: Pt. Will improve bilateral thumb IP flexion to improve active grasping patterns.    Baseline: 11/30/2023: Continue 11/02/23: Thumb IP flexion: R: 42(55) L: 10(25) 07/13/23: Thumb IP flexion: R: 42(50) L: 30(45) 05/25/2023: Thumb IP flexion: R: 40(50) L: 30(45) 04/04/2023:  R: 40(50) L: 25(45) 01/19/2023: Right 35(50), Left: 20/45)    Goal status: Ongoing    15. Pt. Will improve bilateral FMC/speed, and dexterity. As evidence by improved scores on the 9 hole peg test. Baseline: 11/30/2023: Limited bilateral fine motor coordination;continue 11/02/23: 2 min. & 51 sec. 07/13/2023: Right: 9 pegs placed and removed in 2 min. & 11 sec. 05/25/2023: 9 Hole pegs test speed/dexterity: Right: 9 pegs placed and removed in 2 min. & 41 sec. Left: Unable to grasp the pegs for a horizontal position. 04/04/23: Pt. was able to consistently grasp the pegs however each one slipped out of her fingers when attempting to place them into the Pegboard. 01/19/2023: 2 pegs placed in 1 min. & 5 sec.  Pt. was able to remove 9 pegs in 17 sec.; Left: 2 pegs placed in 1 min. & 10 sec. Pt. was able to remove 9 pegs in 20 sec.   Goal status: Ongoing    16. Pt. Will donn/doff a jacket with modified independence   Baseline: 11/30/2023: Continue 11/02/23: MaxA   Goal status: Ongoing     ASSESSMENT:  CLINICAL IMPRESSION:  Upon arrival Pt.'s BP was 96/55 HR 73 bpms. Pt. drank the Gatorade that she brought from home, and elevated her feet. Pt. tolerated manual therapy/soft tissue metacarpal spread stretches with  Pt. continues to benefit from OT services to work on impoving ROM, and UE strength in order to work towards increasing bilateral hand grasp on objects, and increasing engagement of bilateral hands during ADLs, and IADL tasks    PERFORMANCE DE PERFORMANCE DEFICITS: in functional skills including ADLs,  IADLs, coordination, dexterity, ROM, strength, and UE functional use, cognitive skills including , and psychosocial  skills including coping strategies and environmental adaptation.   IMPAIRMENTS: are limiting patient from ADLs, IADLs, and leisure.   CO-MORBIDITIES: may have co-morbidities  that affects occupational performance. Patient will benefit from skilled OT to address above impairments and improve overall function.  MODIFICATION OR ASSISTANCE TO COMPLETE EVALUATION: Min-Moderate modification of tasks or assist with assess necessary to complete an evaluation.  OT OCCUPATIONAL PROFILE AND HISTORY: Detailed assessment: Review of records and additional review of physical, cognitive, psychosocial history related to current functional performance.  CLINICAL DECISION MAKING: Moderate - several treatment options, min-mod task modification necessary  REHAB POTENTIAL: Good for stated goals  PLAN:  OT FREQUENCY 2x's a week  OT DURATION: 12 weeks  PLANNED INTERVENTIONS ADL training, A/E training, UE ther. Ex, Manual therapy, neuromuscular re-education, moist heat modality, Paraffin Bath, Splinting, and  Pt./caregiver education   RECOMMENDED OTHER SERVICES: PT  CONSULTED AND AGREED WITH PLAN OF CARE: Patient  PLAN FOR NEXT SESSION: Continue Treatment as per established POC  Richardson Otter, MS, OTR/L  01/16/2024, 12:21 PM

## 2024-01-18 ENCOUNTER — Ambulatory Visit

## 2024-01-18 ENCOUNTER — Ambulatory Visit: Admitting: Occupational Therapy

## 2024-01-23 ENCOUNTER — Non-Acute Institutional Stay (SKILLED_NURSING_FACILITY): Payer: Self-pay | Admitting: Internal Medicine

## 2024-01-23 ENCOUNTER — Ambulatory Visit: Admitting: Occupational Therapy

## 2024-01-23 ENCOUNTER — Encounter: Payer: Self-pay | Admitting: Internal Medicine

## 2024-01-23 ENCOUNTER — Ambulatory Visit

## 2024-01-23 DIAGNOSIS — L299 Pruritus, unspecified: Secondary | ICD-10-CM

## 2024-01-23 DIAGNOSIS — N319 Neuromuscular dysfunction of bladder, unspecified: Secondary | ICD-10-CM | POA: Diagnosis not present

## 2024-01-23 DIAGNOSIS — Z91018 Allergy to other foods: Secondary | ICD-10-CM | POA: Diagnosis not present

## 2024-01-23 DIAGNOSIS — F329 Major depressive disorder, single episode, unspecified: Secondary | ICD-10-CM

## 2024-01-23 DIAGNOSIS — I951 Orthostatic hypotension: Secondary | ICD-10-CM | POA: Diagnosis not present

## 2024-01-23 DIAGNOSIS — F418 Other specified anxiety disorders: Secondary | ICD-10-CM | POA: Diagnosis not present

## 2024-01-23 DIAGNOSIS — R140 Abdominal distension (gaseous): Secondary | ICD-10-CM | POA: Diagnosis not present

## 2024-01-23 DIAGNOSIS — I5032 Chronic diastolic (congestive) heart failure: Secondary | ICD-10-CM

## 2024-01-23 DIAGNOSIS — M792 Neuralgia and neuritis, unspecified: Secondary | ICD-10-CM

## 2024-01-23 DIAGNOSIS — Z86718 Personal history of other venous thrombosis and embolism: Secondary | ICD-10-CM | POA: Diagnosis not present

## 2024-01-23 DIAGNOSIS — Z978 Presence of other specified devices: Secondary | ICD-10-CM | POA: Diagnosis not present

## 2024-01-23 DIAGNOSIS — G8250 Quadriplegia, unspecified: Secondary | ICD-10-CM

## 2024-01-23 MED ORDER — SIMETHICONE 80 MG PO TABS: 160.0000 mg | ORAL_TABLET | Freq: Four times a day (QID) | ORAL | Status: AC | PRN

## 2024-01-23 MED ORDER — FUROSEMIDE 20 MG PO TABS: 20.0000 mg | ORAL_TABLET | Freq: Every day | ORAL | Status: AC

## 2024-01-23 NOTE — Assessment & Plan Note (Signed)
 Chronic indwelling Foley for neurogenic bladder.

## 2024-01-23 NOTE — Assessment & Plan Note (Signed)
 Continue on midodrine  5 mg every 8 hours.

## 2024-01-23 NOTE — Assessment & Plan Note (Signed)
 Chronic.  Patient remains in a electric wheelchair.  She has a chronic Foley catheter.

## 2024-01-23 NOTE — Assessment & Plan Note (Signed)
 Patient's husband died in the last 2 months.  Continue Cymbalta  60 mg daily.

## 2024-01-23 NOTE — Assessment & Plan Note (Signed)
Continue Xarelto 10 mg daily 

## 2024-01-23 NOTE — Assessment & Plan Note (Signed)
 Continue with Aldactone  12.5 mg daily.  Given her complaints of lower extremity edema, will give her Lasix  20 mg daily for 3 days.

## 2024-01-23 NOTE — Assessment & Plan Note (Signed)
 Change her to Allegra 180 mg daily.  Consider repeat alpha gal testing if Allegra does not help with her itching.

## 2024-01-23 NOTE — Assessment & Plan Note (Signed)
 Crease simethicone  to 160 mg every 6 hours as needed for abdominal bloating or flatulence.

## 2024-01-23 NOTE — Assessment & Plan Note (Signed)
 Continue with chronic Foley catheter.  She has acetic acid Foley catheter rinses to keep her catheter patent.

## 2024-01-23 NOTE — Assessment & Plan Note (Signed)
 Continue with Cymbalta  60 mg daily, Neurontin  100 mg nightly.

## 2024-01-23 NOTE — Assessment & Plan Note (Signed)
 Discontinue Zyrtec 5 mg daily.  Start Allegra 24-hour formulation 180 mg daily.  Patient does have a history of alpha gal.  Not sure if she has been getting any milk products.  Her alpha gal allergy dates back to 2018.  Not sure if she would benefit from repeat testing.  Will see how Allegra goes in the next month and consider alpha gal testing again.

## 2024-01-23 NOTE — Progress Notes (Signed)
 [facility] Routine Visit Progress Note    Location:  Other [place].  Nursing Home Room Number: [facility] [id number] Place of Service:  SNF (31)   ERE:Ryzw, Camellia, DO   Patient Care Team: Laurence Camellia, DO as PCP - General (Internal Medicine) Caro Harlene POUR, NP as Nurse Practitioner (Geriatric Medicine)   Extended Emergency Contact Information Primary Emergency Contact: [name],[name] Mobile Phone: [phone] Relation: Daughter Secondary Emergency Contact: Mathias Lynwood HERO Address: [address]          [place], [place] [address] [place] Home Phone: [phone] Mobile Phone: [phone] Relation: Spouse   Goals of care: Advanced Directive information    01/23/2024   12:35 PM  Advanced Directives  Does Patient Have a Medical Advance Directive? Yes  Type of Estate Agent of [place];Living will;Out of facility DNR (pink MOST or yellow form)  Does patient want to make changes to medical advance directive? No - Patient declined  Copy of Healthcare Power of Attorney in Chart? Yes - validated most recent copy scanned in chart (See row information)    CODE STATUS: Do Not Resuscitate (DNR)   Chief Complaint  Patient presents with   Medical Management of Chronic Issues    Medical Management of Chronic Issues.      HPI: Pt is a 89 y.o. female seen today for medical management of chronic disease.   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  Patient's husband died in the last 2 months.  She also has chronic pain on Cymbalta .  She has also chronic diastolic heart failure.  She is being seen for her routine monthly visit.  She has family  coming into town to visit her for the Christmas holiday.  Her spirits seem good for the holiday season despite her husband passing away recently.  Patient's biggest complaint is that she is chronically itchy.  She is already on Zyrtec 5 mg a day and has been on this for quite a while.  She thinks that her itchiness is worse during the day.  She sleeps well at night.  She is agreeable to changing over to Allegra 180 mg daily to see if this helps.  She is ready on lotion for dry skin and she drinks plenty of water.  She does think that her legs are somewhat more swollen.  She is on daily Lasix .  Remains in a cast on her left leg.  She is going to follow-up with orthopedics in a few weeks to see if the cast can come off.    Past Medical History:  Diagnosis Date   Acute blood loss anemia    Arthritis    Cancer (HCC)    skin   Central cord syndrome at C6 level of cervical spinal cord (HCC) 11/29/2017   Closed hip fracture (HCC) 08/15/2021   DVT (deep venous thrombosis) (HCC) 08/15/2021   Hypertension    Protein-calorie malnutrition, severe 01/24/2018   S/P insertion of IVC (inferior vena caval) filter 01/24/2018   SCC (squamous cell carcinoma) 04/08/2014   Formatting of this note might be different from the original.  ISCC of the right medial ankle treated with Mohs surgery on 04/08/14.  Formatting of this note might be different from the  original.  ISCC of the right medial ankle treated with Mohs surgery on 04/08/14.     Tetraparesis [facility])    Past Surgical History:  Procedure Laterality Date   ANTERIOR CERVICAL DECOMP/DISCECTOMY FUSION N/A 11/29/2017   Procedure: Cervical five-six, six-seven Anterior Cervical Decompression Fusion;  Surgeon: Cheryle Debby LABOR, MD;  Location: [facility] OR;  Service: Neurosurgery;  Laterality: N/A;  Cervical five-six, six-seven Anterior Cervical Decompression Fusion   CATARACT EXTRACTION     FEMUR IM NAIL Left 08/16/2021   Procedure: INTRAMEDULLARY (IM) NAIL FEMORAL;   Surgeon: Cleotilde Barrio, MD;  Location: [facility] ORS;  Service: Orthopedics;  Laterality: Left;   IR IVC FILTER PLMT / S&I /IMG GUID/MOD SED  12/08/2017     Allergies[1]   Outpatient Encounter Medications as of 01/23/2024  Medication Sig   acetaminophen  (TYLENOL ) 500 MG tablet Take 1,000 mg by mouth 3 (three) times daily.   acetic acid 0.25 % irrigation Use 30ml via irrigation twice daily to keep foley Cath patent.   alendronate (FOSAMAX) 70 MG tablet Take 70 mg by mouth every Saturday.   calcium  carbonate (CALCIUM  600) 600 MG TABS tablet Take 600 mg by mouth at bedtime.   Capsaicin  0.1 % CREA Apply 1 application  topically every evening. (Apply to hands)   cetirizine (ZYRTEC) 5 MG tablet Take 5 mg by mouth daily.   cholecalciferol  (VITAMIN D3) 25 MCG (1000 UNIT) tablet Take 1,000 Units by mouth daily.   DULoxetine  (CYMBALTA ) 60 MG capsule Take 60 mg by mouth daily.   ferrous sulfate  325 (65 FE) MG tablet Take 325 mg by mouth daily with breakfast.   fluocinonide (LIDEX) 0.05 % external solution Apply 1 Application topically every 12 (twelve) hours as needed.   furosemide  (LASIX ) 20 MG tablet Take 20 mg by mouth daily. Give one tablet by mouth every 13 hours as needed for weight gain >5lbs in 1 week.   furosemide  (LASIX ) 20 MG tablet Take 1 tablet (20 mg total) by mouth daily.   gabapentin  (NEURONTIN ) 100 MG capsule Take 1 capsule (100 mg total) by mouth at bedtime.   hydroxypropyl methylcellulose / hypromellose (ISOPTO TEARS / GONIOVISC) 2.5 % ophthalmic solution Place 1 drop into both eyes every hour as needed for dry eyes.   ketoconazole (NIZORAL) 2 % shampoo Apply 1 Application topically as directed. Every shift on Thursday for seborrheic dermatitis flare up   lactulose , encephalopathy, (CHRONULAC ) 10 GM/15ML SOLN Take 30 mLs by mouth daily as needed (mild constipation).   magnesium  citrate solution Take 60-120 mLs by mouth daily as needed (constipation).   Menthol , Topical Analgesic,  (BIOFREEZE COOL THE PAIN) 4 % GEL Apply 1 application  topically every 8 (eight) hours as needed (for left knee pain).   methylcellulose oral powder Take 1 packet by mouth at bedtime.   midodrine  (PROAMATINE ) 5 MG tablet Take 5 mg by mouth every 8 (eight) hours as needed (for SBP < 90).   Multiple Vitamin (MULTIVITAMIN WITH MINERALS) TABS tablet Take 1 tablet by mouth daily.   olopatadine (PATADAY) 0.1 % ophthalmic solution Place 1 drop into both eyes 2 (two) times daily.   ondansetron  (ZOFRAN ) 4 MG tablet Take 4 mg by mouth every 6 (six) hours as needed for nausea or vomiting.   polyethylene glycol (MIRALAX  / GLYCOLAX ) 17 g packet Take 17 g by mouth daily as needed. And daily scheduled in the evening   rivaroxaban  (XARELTO ) 10 MG TABS tablet Take 10 mg by mouth daily.   saccharomyces boulardii (FLORASTOR) 250  MG capsule Take 250 mg by mouth daily.   Saline Spray 0.2 % SOLN Place 2 sprays into both nostrils 3 (three) times daily as needed (nasal congestion).   Simethicone  80 MG TABS Take 2 tablets (160 mg total) by mouth every 6 (six) hours as needed (bloating, flatuence).   sodium phosphate  (FLEET) 7-19 GM/118ML ENEM Place 1 enema rectally every other day as needed for severe constipation.   spironolactone  (ALDACTONE ) 25 MG tablet Take 12.5 mg by mouth daily.   tiZANidine  (ZANAFLEX ) 2 MG tablet Take 1 tablet (2 mg total) by mouth 2 (two) times daily.   triamcinolone  cream (KENALOG ) 0.1 % Apply to thigh and lower legs topically every 8 hours as needed. Apply to back of neck topically as needed.   [DISCONTINUED] simethicone  (MYLICON) 80 MG chewable tablet Chew 80 mg by mouth every 6 (six) hours as needed for flatulence.   HYDROcodone -acetaminophen  (NORCO/VICODIN) 5-325 MG tablet Take 1 tablet by mouth every 6 (six) hours as needed (pain). (Patient not taking: Reported on 01/23/2024)   No facility-administered encounter medications on file as of 01/23/2024.     Review of Systems  Constitutional:  Negative.   HENT: Negative.    Eyes: Negative.   Respiratory: Negative.    Cardiovascular:  Positive for leg swelling.  Gastrointestinal: Negative.   Endocrine: Negative.   Genitourinary: Negative.   Musculoskeletal: Negative.   Skin:        Itching of skin  Allergic/Immunologic: Negative.   Neurological: Negative.   Hematological: Negative.   Psychiatric/Behavioral: Negative.    All other systems reviewed and are negative.     Immunization History  Administered Date(s) Administered   INFLUENZA, HIGH DOSE SEASONAL PF 11/17/2021   Influenza-Unspecified 11/14/2018, 11/20/2019, 11/17/2020, 11/17/2021, 11/24/2022, 11/15/2023   Moderna Covid-19 Fall Seasonal Vaccine 72yrs & older 05/11/2022, 05/13/2023   Moderna Covid-19 Vaccine Bivalent Booster 76yrs & up 06/30/2021, 12/11/2021, 10/29/2022   Moderna Sars-Covid-2 Vaccination 02/09/2019, 03/09/2019, 12/14/2019, 06/20/2020, 10/24/2020   PNEUMOCOCCAL CONJUGATE-20 10/05/2022   PPD Test 05/16/2019   Tdap 11/29/2017   Unspecified SARS-COV-2 Vaccination 11/25/2023   Zoster Recombinant(Shingrix) 09/02/2022, 12/04/2022   Pertinent  Health Maintenance Due  Topic Date Due   Influenza Vaccine  Completed   Mammogram  Discontinued   Bone Density Scan  Discontinued      08/20/2021    8:20 AM 03/29/2022   10:11 AM 08/31/2022    2:42 PM 09/20/2023    9:15 AM 12/16/2023    3:08 PM  Fall Risk  Falls in the past year?  0 0 0 1  Was there an injury with Fall?  0    1   Fall Risk Category Calculator  0   2  (RETIRED) Patient Fall Risk Level High fall risk       Patient at Risk for Falls Due to  No Fall Risks   History of fall(s);Impaired balance/gait  Fall risk Follow up     Falls evaluation completed     Data saved with a previous flowsheet row definition   Functional Status Survey:     Vitals:   01/23/24 1223  BP: (!) 176/82  Pulse: 70  Resp: 18  Temp: 97.9 F (36.6 C)  SpO2: 90%  Weight: 195 lb 9.6 oz (88.7 kg)  Height: 5' 7  (1.702 m)   Body mass index is 30.64 kg/m. Physical Exam Vitals and nursing note reviewed.  Constitutional:      General: She is not in acute distress.    Appearance:  She is not toxic-appearing or diaphoretic.     Comments: Sitting in electric wheelchair.  Watching TV.  No distress.  Chronically ill-appearing.  Pleasant.  HENT:     Head: Normocephalic and atraumatic.  Eyes:     General: No scleral icterus. Cardiovascular:     Rate and Rhythm: Normal rate and regular rhythm.  Pulmonary:     Effort: Pulmonary effort is normal.     Breath sounds: Normal breath sounds.  Abdominal:     General: Bowel sounds are normal.     Palpations: Abdomen is soft.  Musculoskeletal:     Comments: Left lower leg in a cast.  Trace to +1 pitting edema of the right lower leg.  Skin:    General: Skin is warm.     Capillary Refill: Capillary refill takes less than 2 seconds.  Neurological:     Mental Status: She is alert and oriented to person, place, and time.      Labs reviewed: Recent Labs    09/14/23 1400 09/19/23 0000 09/29/23 0000 10/06/23 0000  NA 137 140 141 141  K 3.6 4.2 4.5 4.2  CL 99 100 103 101  CO2 27 34* 34* 32*  GLUCOSE 143*  --   --   --   BUN 13 10 9 10   CREATININE 0.44 0.5 0.5 0.5  CALCIUM  8.7* 8.8 9.2 9.2   Recent Labs    05/09/23 0000 08/29/23 0000 09/14/23 1400  AST 21 17 26   ALT 16 12 18   ALKPHOS 59 53 53  BILITOT  --   --  1.0  PROT  --   --  6.6  ALBUMIN  3.6 3.6 3.6   Recent Labs    08/10/23 0000 08/29/23 0000 09/14/23 1400 10/06/23 0000  WBC 6.3 4.9 7.5 5.5  NEUTROABS 4,038.00 2,528.00  --  3,427.00  HGB 11.8* 12.2 12.6 13.0  HCT 36 37 38.0 41  MCV  --   --  94.1  --   PLT 280 305 285 330   Lab Results  Component Value Date   TSH 1.15 08/29/2023    Assessment/Plan Itching Discontinue Zyrtec 5 mg daily.  Start Allegra 24-hour formulation 180 mg daily.  Patient does have a history of alpha gal.  Not sure if she has been getting any milk  products.  Her alpha gal allergy dates back to 2018.  Not sure if she would benefit from repeat testing.  Will see how Allegra goes in the next month and consider alpha gal testing again.  Reactive depression Patient's husband died in the last 2 months.  Continue Cymbalta  60 mg daily.  Allergy to alpha-gal Change her to Allegra 180 mg daily.  Consider repeat alpha gal testing if Allegra does not help with her itching.  Orthostatic hypotension Continue on midodrine  5 mg every 8 hours.  Tetraparesis (HCC) Chronic.  Patient remains in a electric wheelchair.  She has a chronic Foley catheter.  Neuropathic pain Continue with Cymbalta  60 mg daily, Neurontin  100 mg nightly.  Neurogenic bladder Continue with chronic Foley catheter.  She has acetic acid Foley catheter rinses to keep her catheter patent.  Depression with anxiety Continue with Cymbalta  60 mg daily  History of DVT (deep vein thrombosis) Continue Xarelto  10 mg daily  Chronic indwelling Foley catheter Chronic indwelling Foley for neurogenic bladder.  Chronic diastolic CHF (congestive heart failure) (HCC) Continue with Aldactone  12.5 mg daily.  Given her complaints of lower extremity edema, will give her Lasix  20 mg daily  for 3 days.  Abdominal bloating Crease simethicone  to 160 mg every 6 hours as needed for abdominal bloating or flatulence.    Meds ordered this encounter  Medications   furosemide  (LASIX ) 20 MG tablet    Sig: Take 1 tablet (20 mg total) by mouth daily.   Simethicone  80 MG TABS    Sig: Take 2 tablets (160 mg total) by mouth every 6 (six) hours as needed (bloating, flatuence).   Medications Discontinued During This Encounter  Medication Reason   simethicone  (MYLICON) 80 MG chewable tablet    No orders of the defined types were placed in this encounter.   Camellia Door, DO [facility] [phone]      [1]  Allergies Allergen Reactions   Other Swelling    Lisinopril-Hydrochlorothiazide Swelling    Swelling of the tongue   Bovine (Beef) Protein-Containing Drug Products Swelling    Facial and lip swelling (due to tick bite)   Dairy Aid [Tilactase] Swelling   Lisinopril-Hydrochlorothiazide Swelling    Tongue swelling   Porcine (Pork) Protein-Containing Drug Products Swelling

## 2024-01-23 NOTE — Assessment & Plan Note (Signed)
Continue with Cymbalta 60 mg daily

## 2024-01-25 ENCOUNTER — Ambulatory Visit: Admitting: Occupational Therapy

## 2024-01-25 ENCOUNTER — Ambulatory Visit

## 2024-01-30 ENCOUNTER — Ambulatory Visit: Admitting: Occupational Therapy

## 2024-01-30 ENCOUNTER — Ambulatory Visit

## 2024-01-31 ENCOUNTER — Non-Acute Institutional Stay (SKILLED_NURSING_FACILITY): Admitting: Internal Medicine

## 2024-01-31 ENCOUNTER — Encounter: Payer: Self-pay | Admitting: Internal Medicine

## 2024-01-31 DIAGNOSIS — I951 Orthostatic hypotension: Secondary | ICD-10-CM

## 2024-01-31 DIAGNOSIS — L219 Seborrheic dermatitis, unspecified: Secondary | ICD-10-CM | POA: Diagnosis not present

## 2024-01-31 MED ORDER — MIDODRINE HCL 2.5 MG PO TABS: 2.5000 mg | ORAL_TABLET | Freq: Three times a day (TID) | ORAL | Status: AC | PRN

## 2024-01-31 MED ORDER — SELENIUM SULFIDE 2.25 % EX SHAM: MEDICATED_SHAMPOO | CUTANEOUS | Status: AC

## 2024-01-31 NOTE — Assessment & Plan Note (Signed)
 I think the source of her apparent hypertension is when she receives her dose of midodrine  5 mg.  I think the spikes are blood pressure too high.  Will reduce her midodrine  dose to 2.5 mg every 8 hours to give only if her systolic blood less than or equal to 90.

## 2024-01-31 NOTE — Assessment & Plan Note (Signed)
 Patient has failed ketoconazole shampoo.  She is currently using head and shoulders which has not helped.  Will start selenium sulfide 2.25% shampoo twice a week to see if this helps.  I do not think that she has psoriasis.

## 2024-01-31 NOTE — Progress Notes (Signed)
 [facility] Acute Care Progress Note    Location:  Other [place].  Nursing Home Room Number: [facility] [id number] Place of Service:  SNF (31)   Laurence Locus, DO   Patient Care Team: Laurence Locus, DO as PCP - General (Internal Medicine) Caro Harlene POUR, NP as Nurse Practitioner (Geriatric Medicine)   Extended Emergency Contact Information Primary Emergency Contact: [name],[name] Mobile Phone: [phone] Relation: Daughter Secondary Emergency Contact: Mathias Lynwood HERO Address: [address]          [place], [place] [address] [place] Home Phone: [phone] Mobile Phone: [phone] Relation: Spouse   Goals of care: Advanced Directive information    01/23/2024   12:35 PM  Advanced Directives  Does Patient Have a Medical Advance Directive? Yes  Type of Estate Agent of [place];Living will;Out of facility DNR (pink MOST or yellow form)  Does patient want to make changes to medical advance directive? No - Patient declined  Copy of Healthcare Power of Attorney in Chart? Yes - validated most recent copy scanned in chart (See row information)     CODE STATUS: Do Not Resuscitate (DNR)   Chief Complaint  Patient presents with   Hypertension    Hypertension.      HPI: Pt is a 89 y.o. female seen today for an acute visit for Hypertension.  Nursing has noted the patient's had elevated blood pressures mostly after she has received midodrine  when her blood pressure is actually low.  Blood pressures as high as systolics in the 180s.  Patient denies any chest pain.  She continues to complain of itchy scalp.  Ketoconazole shampoo has not been effective.  She does state that changing her antihistamine over to Allegra once a day formulation has helped with her generalized sense of whole body itching.     Past Medical History:  Diagnosis Date   Acute blood loss anemia    Arthritis    Cancer (HCC)    skin   Central cord syndrome at C6  level of cervical spinal cord (HCC) 11/29/2017   Closed hip fracture (HCC) 08/15/2021   DVT (deep venous thrombosis) (HCC) 08/15/2021   Hypertension    Protein-calorie malnutrition, severe 01/24/2018   S/P insertion of IVC (inferior vena caval) filter 01/24/2018   SCC (squamous cell carcinoma) 04/08/2014   Formatting of this note might be different from the original.  ISCC of the right medial ankle treated with Mohs surgery on 04/08/14.  Formatting of this note might be different from the original.  ISCC of the right medial ankle treated with Mohs surgery on 04/08/14.     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[facility])    Past Surgical History:  Procedure Laterality Date   ANTERIOR CERVICAL DECOMP/DISCECTOMY FUSION N/A 11/29/2017   Procedure: Cervical five-six, six-seven Anterior Cervical Decompression Fusion;  Surgeon: Cheryle Debby LABOR, MD;  Location: [facility] OR;  Service: Neurosurgery;  Laterality: N/A;  Cervical five-six, six-seven Anterior Cervical Decompression Fusion   CATARACT EXTRACTION     FEMUR IM NAIL Left 08/16/2021   Procedure: INTRAMEDULLARY (IM) NAIL FEMORAL;  Surgeon: Cleotilde Barrio, MD;  Location: [facility] ORS;  Service: Orthopedics;  Laterality: Left;   IR IVC FILTER PLMT / S&I /IMG GUID/MOD SED  12/08/2017     Allergies[1]   Outpatient Encounter Medications as of 01/31/2024  Medication Sig   acetaminophen  (TYLENOL ) 500 MG tablet Take 1,000 mg by mouth 3 (three) times daily.   acetic acid 0.25 % irrigation Use 30ml via irrigation twice daily to  keep foley Cath patent.   alendronate (FOSAMAX) 70 MG tablet Take 70 mg by mouth every Saturday.   calcium  carbonate (CALCIUM  600) 600 MG TABS tablet Take 600 mg by mouth at bedtime.   Capsaicin  0.1 % CREA Apply 1 application  topically every evening. (Apply to hands)   cholecalciferol  (VITAMIN D3) 25 MCG (1000 UNIT) tablet Take 1,000 Units by mouth daily.   DULoxetine  (CYMBALTA ) 60 MG capsule Take 60 mg by mouth daily.   ferrous sulfate  325 (65 FE) MG tablet Take  325 mg by mouth daily with breakfast.   fexofenadine (ALLEGRA) 180 MG tablet Take 180 mg by mouth daily.   fluocinonide (LIDEX) 0.05 % external solution Apply 1 Application topically every 12 (twelve) hours as needed.   furosemide  (LASIX ) 20 MG tablet Take 20 mg by mouth. Every 24 hours as needed for weight gain >5lbs in 1 weeks.   furosemide  (LASIX ) 20 MG tablet Take 1 tablet (20 mg total) by mouth daily.   gabapentin  (NEURONTIN ) 100 MG capsule Take 1 capsule (100 mg total) by mouth at bedtime.   hydroxypropyl methylcellulose / hypromellose (ISOPTO TEARS / GONIOVISC) 2.5 % ophthalmic solution Place 1 drop into both eyes every hour as needed for dry eyes.   ketoconazole (NIZORAL) 2 % shampoo Apply 1 Application topically as directed. Every shift on Thursday for seborrheic dermatitis flare up   lactulose , encephalopathy, (CHRONULAC ) 10 GM/15ML SOLN Take 30 mLs by mouth daily as needed (mild constipation).   magnesium  citrate solution Take 60-120 mLs by mouth daily as needed (constipation).   Menthol , Topical Analgesic, (BIOFREEZE COOL THE PAIN) 4 % GEL Apply 1 application  topically every 8 (eight) hours as needed (for left knee pain).   methylcellulose oral powder Take 1 packet by mouth at bedtime.   midodrine  (PROAMATINE ) 2.5 MG tablet Take 1 tablet (2.5 mg total) by mouth 3 (three) times daily as needed (SBP <=90 mm Hg).   Multiple Vitamin (MULTIVITAMIN WITH MINERALS) TABS tablet Take 1 tablet by mouth daily.   olopatadine (PATADAY) 0.1 % ophthalmic solution Place 1 drop into both eyes 2 (two) times daily.   ondansetron  (ZOFRAN ) 4 MG tablet Take 4 mg by mouth every 6 (six) hours as needed for nausea or vomiting.   polyethylene glycol (MIRALAX  / GLYCOLAX ) 17 g packet Take 17 g by mouth daily as needed. And daily scheduled in the evening   rivaroxaban  (XARELTO ) 10 MG TABS tablet Take 10 mg by mouth daily.   saccharomyces boulardii (FLORASTOR) 250 MG capsule Take 250 mg by mouth daily.   Saline  Spray 0.2 % SOLN Place 2 sprays into both nostrils 3 (three) times daily as needed (nasal congestion).   Selenium Sulfide 2.25 % SHAM Wash hair twice a week   Simethicone  80 MG TABS Take 2 tablets (160 mg total) by mouth every 6 (six) hours as needed (bloating, flatuence).   sodium phosphate  (FLEET) 7-19 GM/118ML ENEM Place 1 enema rectally every other day as needed for severe constipation.   spironolactone  (ALDACTONE ) 25 MG tablet Take 12.5 mg by mouth daily.   tiZANidine  (ZANAFLEX ) 2 MG tablet Take 1 tablet (2 mg total) by mouth 2 (two) times daily.   triamcinolone  cream (KENALOG ) 0.1 % Apply to thigh and lower legs topically every 8 hours as needed. Apply to back of neck topically as needed.   [DISCONTINUED] midodrine  (PROAMATINE ) 5 MG tablet Take 5 mg by mouth every 8 (eight) hours as needed (for SBP < 90).   cetirizine (ZYRTEC)  5 MG tablet Take 5 mg by mouth daily. (Patient not taking: Reported on 01/31/2024)   HYDROcodone -acetaminophen  (NORCO/VICODIN) 5-325 MG tablet Take 1 tablet by mouth every 6 (six) hours as needed (pain). (Patient not taking: Reported on 01/31/2024)   No facility-administered encounter medications on file as of 01/31/2024.     Review of Systems  Constitutional: Negative.   HENT:         Dry itchy scalp.  Eyes: Negative.   Respiratory: Negative.    Cardiovascular:  Positive for leg swelling.       Persistent lower extremity edema.  Gastrointestinal:        She thinks that her abdominal bloating is somewhat better with  simethicone  160 mg.  Endocrine: Negative.   Genitourinary: Negative.   Allergic/Immunologic: Negative.   Neurological: Negative.   Hematological: Negative.   All other systems reviewed and are negative.    Immunization History  Administered Date(s) Administered   INFLUENZA, HIGH DOSE SEASONAL PF 11/17/2021   Influenza-Unspecified 11/14/2018, 11/20/2019, 11/17/2020, 11/17/2021, 11/24/2022, 11/15/2023   Moderna Covid-19 Fall Seasonal Vaccine  57yrs & older 05/11/2022, 05/13/2023   Moderna Covid-19 Vaccine Bivalent Booster 31yrs & up 06/30/2021, 12/11/2021, 10/29/2022   Moderna Sars-Covid-2 Vaccination 02/09/2019, 03/09/2019, 12/14/2019, 06/20/2020, 10/24/2020   PNEUMOCOCCAL CONJUGATE-20 10/05/2022   PPD Test 05/16/2019   Tdap 11/29/2017   Unspecified SARS-COV-2 Vaccination 11/25/2023   Zoster Recombinant(Shingrix) 09/02/2022, 12/04/2022   Pertinent  Health Maintenance Due  Topic Date Due   Influenza Vaccine  Completed   Mammogram  Discontinued   Bone Density Scan  Discontinued      08/20/2021    8:20 AM 03/29/2022   10:11 AM 08/31/2022    2:42 PM 09/20/2023    9:15 AM 12/16/2023    3:08 PM  Fall Risk  Falls in the past year?  0 0 0 1  Was there an injury with Fall?  0    1   Fall Risk Category Calculator  0   2  (RETIRED) Patient Fall Risk Level High fall risk       Patient at Risk for Falls Due to  No Fall Risks   History of fall(s);Impaired balance/gait  Fall risk Follow up     Falls evaluation completed     Data saved with a previous flowsheet row definition   Functional Status Survey:     Vitals:   01/31/24 0953  BP: 120/73  Pulse: 80  Resp: 18  Temp: 97.7 F (36.5 C)  SpO2: 94%  Weight: 197 lb 12.8 oz (89.7 kg)  Height: 5' 7 (1.702 m)   Body mass index is 30.98 kg/m. Physical Exam Constitutional:      General: She is not in acute distress.    Appearance: She is not toxic-appearing.  HENT:     Head: Normocephalic and atraumatic.  Cardiovascular:     Rate and Rhythm: Normal rate.  Pulmonary:     Effort: Pulmonary effort is normal.  Abdominal:     Comments: Abdominal gas was percussed.  Mild bloating.  Abdomen soft.  Genitourinary:    Comments: Chronic Foley catheter. Skin:    General: Skin is warm and dry.     Comments: Dry flaky dandruff on her scalp.  No evidence of psoriasis on her extensor surfaces of her elbows.  Neurological:     Mental Status: She is alert and oriented to person,  place, and time.      Labs reviewed: Recent Labs    09/14/23 1400 09/19/23 0000  09/29/23 0000 10/06/23 0000  NA 137 140 141 141  K 3.6 4.2 4.5 4.2  CL 99 100 103 101  CO2 27 34* 34* 32*  GLUCOSE 143*  --   --   --   BUN 13 10 9 10   CREATININE 0.44 0.5 0.5 0.5  CALCIUM  8.7* 8.8 9.2 9.2   Recent Labs    05/09/23 0000 08/29/23 0000 09/14/23 1400  AST 21 17 26   ALT 16 12 18   ALKPHOS 59 53 53  BILITOT  --   --  1.0  PROT  --   --  6.6  ALBUMIN  3.6 3.6 3.6   Recent Labs    08/10/23 0000 08/29/23 0000 09/14/23 1400 10/06/23 0000  WBC 6.3 4.9 7.5 5.5  NEUTROABS 4,038.00 2,528.00  --  3,427.00  HGB 11.8* 12.2 12.6 13.0  HCT 36 37 38.0 41  MCV  --   --  94.1  --   PLT 280 305 285 330   Lab Results  Component Value Date   TSH 1.15 08/29/2023   No results found for: HGBA1C No results found for: CHOL, HDL, LDLCALC, LDLDIRECT, TRIG, CHOLHDL   Significant Diagnostic Results in last 30 days: No results found.   Assessment and Plan: Seborrheic dermatitis of scalp Patient has failed ketoconazole shampoo.  She is currently using head and shoulders which has not helped.  Will start selenium sulfide 2.25% shampoo twice a week to see if this helps.  I do not think that she has psoriasis.  Orthostatic hypotension I think the source of her apparent hypertension is when she receives her dose of midodrine  5 mg.  I think the spikes are blood pressure too high.  Will reduce her midodrine  dose to 2.5 mg every 8 hours to give only if her systolic blood less than or equal to 90.    Meds ordered this encounter  Medications   Selenium Sulfide 2.25 % SHAM    Sig: Wash hair twice a week   midodrine  (PROAMATINE ) 2.5 MG tablet    Sig: Take 1 tablet (2.5 mg total) by mouth 3 (three) times daily as needed (SBP <=90 mm Hg).   Medications Discontinued During This Encounter  Medication Reason   midodrine  (PROAMATINE ) 5 MG tablet Change in therapy      Camellia Door,  DO  [facility] [phone]      [1]  Allergies Allergen Reactions   Other Swelling   Lisinopril-Hydrochlorothiazide Swelling    Swelling of the tongue   Bovine (Beef) Protein-Containing Drug Products Swelling    Facial and lip swelling (due to tick bite)   Dairy Aid [Tilactase] Swelling   Lisinopril-Hydrochlorothiazide Swelling    Tongue swelling   Porcine (Pork) Protein-Containing Drug Products Swelling

## 2024-02-01 ENCOUNTER — Ambulatory Visit: Admitting: Occupational Therapy

## 2024-02-01 ENCOUNTER — Ambulatory Visit

## 2024-02-01 DIAGNOSIS — M6281 Muscle weakness (generalized): Secondary | ICD-10-CM | POA: Diagnosis not present

## 2024-02-01 NOTE — Therapy (Signed)
 "  Occupational Therapy Neuro Treatment/Recertification Note  Patient Name: Sherry [name] MRN: [id number] DOB:1934/04/14, 89 y.o., female Today's Date: 11/29/2022  PCP:  [facility] PROVIDER: Abdul Fine, MD  END OF SESSION:   OT End of Session - 02/01/24 1525     Visit Number [id number]    Number of Visits 192    Date for Recertification  04/25/24    OT Start Time 1100    OT Stop Time 1145    OT Time Calculation (min) 45 min    Equipment Utilized During Treatment powered wheelchair    Behavior During Therapy WFL for tasks assessed/performed              Past Medical History:  Diagnosis Date   Acute blood loss anemia    Arthritis    Cancer (HCC)    skin   Central cord syndrome at C6 level of cervical spinal cord (HCC) 11/29/2017   Hypertension    Protein-calorie malnutrition, severe (HCC) 01/24/2018   S/P insertion of IVC (inferior vena caval) filter 01/24/2018   Tetraparesis (HCC)    Past Surgical History:  Procedure Laterality Date   ANTERIOR CERVICAL DECOMP/DISCECTOMY FUSION N/A 11/29/2017   Procedure: Cervical five-six, six-seven Anterior Cervical Decompression Fusion;  Surgeon: Cheryle Debby DELENA, MD;  Location: [facility] OR;  Service: Neurosurgery;  Laterality: N/A;  Cervical five-six, six-seven Anterior Cervical Decompression Fusion   CATARACT EXTRACTION     FEMUR IM NAIL Left 08/16/2021   Procedure: INTRAMEDULLARY (IM) NAIL FEMORAL;  Surgeon: Cleotilde Barrio, MD;  Location: [facility] ORS;  Service: Orthopedics;  Laterality: Left;   IR IVC FILTER PLMT / S&I /IMG GUID/MOD SED  12/08/2017   Patient Active Problem List   Diagnosis Date Noted   Age-related osteoporosis without current pathological fracture 07/27/2022   Mild recurrent major depression (HCC) 07/27/2022   Hypertensive heart disease with other congestive heart failure (HCC) 02/14/2022   Chronic indwelling Foley catheter 02/14/2022   Closed hip fracture (HCC) 08/15/2021   DVT (deep venous thrombosis) (HCC)  08/15/2021   Quadriplegia (HCC) 08/15/2021   Fall 08/15/2021   Constipation due to slow transit 08/31/2018   Trauma 06/05/2018   Neuropathic pain 06/05/2018   Neurogenic bowel 06/05/2018   Vaginal yeast infection 01/30/2018   Healthcare-associated pneumonia 01/25/2018   Chronic allergic rhinitis 01/24/2018   Depression with anxiety 01/24/2018   UTI due to Klebsiella species 01/24/2018   Protein-calorie malnutrition, severe (HCC) 01/24/2018   S/P insertion of IVC (inferior vena caval) filter 01/24/2018   Tetraparesis (HCC) 01/20/2018   Neurogenic bladder 01/20/2018   Reactive depression    Benign essential HTN    Acute postoperative anemia due to expected blood loss    Central cord syndrome at C6 level of cervical spinal cord (HCC) 11/29/2017   Allergy to alpha-gal 11/25/2016   SCC (squamous cell carcinoma) 04/08/2014   ONSET DATE: 11/29/2017  REFERRING DIAG: Central Cord Syndrome at C6 of the Cervical Spinal Cord, Fall with Bilateral Closed Hip Fractures, with ORIF repair with Intramedullary Nailing of the Left Hip Fracture.   THERAPY DIAG:  Muscle weakness (generalized)  Other lack of coordination  Rationale for Evaluation and Treatment: Rehabilitation  SUBJECTIVE:  SUBJECTIVE STATEMENT:  Patient reports  having had a nice holiday with family.  PERTINENT HISTORY: Central Cord Syndrome at C6 of the Cervical Spinal Cord, Fall with Bilateral Closed Hip Fractures, with ORIF repair with Intramedullary Nailing of the Left Hip Fracture.   PRECAUTIONS: None  WEIGHT BEARING RESTRICTIONS: No  PAIN:  Are you having pain? No pain   LIVING ENVIRONMENT: Lives with: [facility] Stairs: No Has following equipment at home: Wheelchair (power)  PLOF: Independent   PATIENT GOALS:  See below for established goals  OBJECTIVE:  Note: Objective measures were completed at Evaluation unless otherwise noted.  HAND DOMINANCE: Right  ADLs: Caregiver assist with  ADLs, and [facility]  MOBILITY STATUS: Uses a power w/c  ACTIVITY TOLERANCE: Activity tolerance: Fair  FUNCTIONAL OUTCOME MEASURES:  Measurements:   12/09/2021:   Shoulder flexion: R: 120(136), L: 138(150) Shoulder abduction: R: 108(120), L: 110(120) Elbow: R: 0-148, L: 0-146 Wrist flexion: R: 55(62), L: 78 Writ extension: R: 34(55), L: 3(4) RD: R: 12(20), L: 16(26) UD: R: 8(24), L: 6(18) Thumb radial abduction: R: 11(20), L: 8(20) Digit flexion to the [facility]: R:  2nd: 5cm(3.5cm), 3rd: 7.5 cm(5cm), 4th: 7cm(3cm), 5th: 6cm(5.5cm) L: 2nd: 8cm(8cm), 3rd: 8cm(8cm), 4th: 7.5cm(7cm), 5th: 7cm(7cm)   02/03/2022:   Shoulder flexion: R: 122(138), L: 148(154) Shoulder abduction: R: 109(125), L: 125(125 Elbow: R: 0-148, L: 0-146 Wrist flexion: R: 60(68), L: 79 Writ extension: R: 40(55), L: 3(10) RD: R: 14(24), L: 20(28) UD: R: 8(24), L: 6(18) Thumb radial abduction: R: 20(25), L: 8(20) Digit flexion to the [facility]: R:  2nd: 4.5cm(3cm), 3rd: 6.5cm(4.5cm), 4th: 6.5 cm(3cm), 5th: 4.5cm(5cm) L: 2nd: 8cm(8cm), 3rd: 8cm(7cm), 4th: 7.5cm(7cm), 5th: 6.5cm(6.5cm)   04/07/2022:   Shoulder flexion: R: 125(150), L: 160(160) Shoulder abduction: R: 109(125), L: 125(125 Elbow: R: 0-148, L: 0-146 Wrist flexion: R: 60(68), L: 79 Writ extension: R: 40(55), L: 5(18) RD: R: 14(24), L: 20(28) UD: R: 18(24), L: 8(18) Thumb radial abduction: R: 20(25), L: 8(20) Digit flexion to the [facility]: R:  2nd: 4.5 cm(4 cm), 3rd: 6.5cm(3cm), 4th: 7 cm (5cm), 5th: 5cm(5cm) L: 2nd: 8cm(8cm), 3rd: 8cm(7cm), 4th: 7.5cm(7cm), 5th: 7cm(7cm)   07/05/2022:   Shoulder flexion: R: 125(154), L: 160(160) Shoulder abduction: R: 95(130), L: 143(143) Elbow: R: 0-148, L: 0-146 Wrist flexion: R: 60(68), L: 79 Writ extension: R: 40(60), L: 0(12) RD: R: 10(24), L: 12(20) UD: R: 16(24), L: 8(16) Thumb radial abduction: R: 20(25), L: 8(20) Digit flexion to the [facility]: R:  2nd: 4 cm(4cm), 3rd: 6.5cm( 3.5cm), 4th: 7 cm (5cm), 5th:  6cm(5cm) L: 2nd: 8cm(8cm), 3rd: 8cm(7cm), 4th: 8cm(7cm), 5th: 7cm(7cm)   09/13/2022:   Shoulder flexion: R: 125(154), L: 160(160) Shoulder abduction: R: 100(130), L: 143(143) Elbow: R: 0-150, L: 0-150 Wrist flexion: R: 55(68), L: 79 Writ extension: R: 45(60), L: 0(12) RD: R: 10(24), L: 12(20) UD: R: 16(24), L: 8(16) Thumb radial abduction: R: 20(25), L: 8(20) Digit flexion to the [facility]: R:  2nd: 4 cm(4cm), 3rd: 6cm( 5cm), 4th: 6 cm (4cm), 5th: 5cm(5cm) L: 2nd: 7cm(cm), 3rd: 7cm(6cm), 4th: 7cm(6cm), 5th: 6cm(6cm)   10/27/2022   Shoulder flexion: R: 128(154), L: 160(160) Shoulder abduction: R: 105(130), L: 143(143) Elbow: R: 0-150, L: 0-150 Wrist flexion: R: 55(68), L: 79 Writ extension: R: 46(64), L: 0(20) RD: R: 10(20), L: 18(24) UD: R: 18(28), L: 8(20) Thumb radial abduction: R: 30(42), L:20(24) Digit flexion to the [facility]: R:  2nd: 4 cm(3cm), 3rd: 6cm( 6cm), 4th: 6 cm (5cm), 5th: 5cm(5cm) L: 2nd: 8cm(8cm), 3rd: 8cm(6cm), 4th: 8cm(7cm), 5th: 6cm(6cm)  01/19/2023  Shoulder flexion: R: 131(154), L: 160(160) Shoulder abduction: R: 110(130), L: 143(143) Elbow: R: 0-150, L: 0-150 Wrist flexion: R: 55(68), L: 79 Writ extension: R: 46(64), L: 0(20) RD: R: 10(20), L: 18(24) UD: R: 18(28), L: 14(20) Thumb radial abduction: R:  38(48), L:24(26) Thumb IP flexion: R: 35(50) L: 20(45) Digit flexion to the [facility]: R:  2nd: 3.5 cm(3cm), 3rd: 6cm( 6cm), 4th: 6 cm (5cm), 5th: 5cm(4.5cm) L: 2nd: 8cm(7.5cm), 3rd: 8cm(6cm), 4th: 7.5cm(6cm), 5th: 6cm(4.5cm)  04/04/2023  Shoulder flexion: R: 134(154), L: 160(160) Shoulder abduction: R: 114(130), L: 143(143) Elbow: R: WNL, L: WNL Wrist flexion: R: 55(68), L: 80 Writ extension: R: 46(64), L: -10(10) RD: R: 14(24), L: 20(24) UD: R: 20(30), L: 14(20) Thumb radial abduction: R: 38(48), L:28(30) Thumb IP flexion: R: 40(50) L: 25(45) Digit flexion to the [facility]: R:  2nd: 3.5 cm(3cm), 3rd: 6cm( 5cm), 4th: 6 cm (4cm), 5th: 5cm(5cm) L: 2nd: 8cm(7.5cm),  3rd: 8cm(7cm), 4th: 7.5cm(7cm), 5th: 7cm(6cm)  05/25/2023  Shoulder flexion: R: 134(154), L: 160(160) Shoulder abduction: R: 117(134), L: 143(143) Elbow: R: WNL, L: WNL Wrist flexion: R: 55(68), L: 80 Wrist extension: R: 46(64), L: -8(14) RD: R: 14(24), L: 20(24) UD: R: 20(32), L: 14(22) Thumb radial abduction: R: 38(50), L:28(30) Thumb IP flexion: R: 40(50) L: 30(45) Digit flexion to the [facility]: R:  2nd: 3.5 cm(3cm), 3rd: 6cm( 5cm), 4th: 7 cm (4cm), 5th: 5cm(4cm) L: 2nd: 8cm(7.5cm), 3rd: 8cm(7cm), 4th: 7.5cm(7cm), 5th: 7cm(6cm)  07/13/23  Shoulder flexion: R: 134(154), L: 160(160) Shoulder abduction: R: 122(134), L: 143(143) Elbow: R: WNL, L: WNL Wrist flexion: R: 55(68), L: 80 Wrist extension: R: 50(64), L: -4(18) RD: R: 18(24), L: 24(24) UD: R: 20(32), L: 14(22) Thumb radial abduction: R: 38(50), L:32(30) Thumb IP flexion: R: 42(50) L: 30(45) Digit flexion to the [facility]: R:  2nd: 3.5 cm(3cm), 3rd: 6cm( 5cm), 4th: 7 cm (4cm), 5th: 5cm(4cm) L: 2nd: 8cm(7.5cm), 3rd: 8cm(7cm), 4th: 7.5cm(7cm), 5th: 7cm(6cm)  11/02/23:   Shoulder flexion: R: 136(154), L: 160(160) Shoulder abduction: R: 123(134), L: 143(143) Elbow: R: WNL, L: WNL Wrist flexion: R: 55(68), L: 80 Wrist extension: R: 30(38), L: -16(18) RD: R: 8(18), L: 18(24) UD: R: 12(24), L: 8(22) Thumb radial abduction: R: 28(50), L:28(30) Thumb IP flexion: R: 42(55) L: 10(25) Digit flexion to the [facility]: R:  2nd: 4.5cm(4cm), 3rd: 6cm( 4.5cm), 4th: 6.5cm (5cm), 5th: 5cm(4.5cm) L: 2nd: 8cm(7.5cm), 3rd: 7.5cm(7cm), 4th: 7cm(7cm), 5th: 6cm(6cm)    COORDINATION:   01/19/2023:  9 hole peg test:  Right: 2 pegs placed in 1 min. & 5 sec.  Pt. was able to remove 9 pegs in 17 sec.  Left: 2 pegs placed in 1 min. & 10 sec. Pt. was able to remove 9 pegs in 20 sec.  04/04/23:  Pt. was able to consistently grasp the pegs however each one slipped out of her fingers when attempting to place them into the Pegboard.  05/25/2023:   Right:  9 pegs placed and removed in 2 min. & 41 sec. Left: Unable to grasp the pegs for a horizontal position.  07/13/23    Right: 9 pegs placed and removed in 2 min. & 11 sec.     11/02/23:                Right 9 Hole Peg Test: 2 min. & 51 sec.   SENSATION: Intact  EDEMA: N/A   COGNITION: Overall cognitive status: WNL   VISION: Subjective report: Wears glasses, No changes in vision.  PERCEPTION: Intact  TODAY'S TREATMENT:  DATE: 02/01/24  Therapeutic Ex.:    -Pt. tolerated PROM with slow prolonged gentle stretching to the bilateral wrist extension with increased focus on left wrist extension. AAROM/PROM for bilateral digit MP, PIP, and DIP flexion, and extension, thumb abduction was performed in conjunction with moist heat modality 2/2 increased extensor tone, and tightness in the left wrist, and bilateral digits. -Performed BUE strengthening using a 2# dowel ex. 2/2 to weakness for bilateral shoulder flexion, chest press, circular patterns, and elbow flexion/extension for 1 set  10 reps each.       PATIENT EDUCATION: Education details: Bilateral hand  ROM, condition management, self-care: Condition Management: BP Person educated: Patient Education method: Explanation, Demonstration, and Verbal cues Education comprehension: verbalized understanding and returned demonstration  HOME EXERCISE PROGRAM: Continue to assess HEP needs, and provide as needed.   GOALS: Goals reviewed with patient? Yes  LONG TERM GOALS: Target date: 04/25/2024  3.  Patient will independently use a reacher to pick up items from various surfaces.  Baseline: 11/02/23: Pt. is now able to consistently pick up small objects from the floor with a reacher. 07/13/2023: Pt. is starting to use a reacher to retrieve items with an adaptation/modification to the trigger. 05/25/2023:  Pt. is now  able to initiate grasping a reacher to open and close the reacher with the right hand, however requires the grasp handle to be repositioned  within the hand. Pt. requires work on using/moving the reacher with the right hand through various planes. 04/04/2023: Pt. continues to work on improving Digit flexion to the [facility] to be able to efficiently handle Adaptive equipment. 01/19/2023: Pt. Is improving with Bilateral digit flexion to the [facility]. Pt. has difficulty firmly holding adaptive devices. 12/06/2022: Pt. continues to present with increased MP, PIP, and DIP digit tightness/stiffness limiting the formulation of bilateral composites fists. 10/27/2022: Pt. presents with increased MP, PIP, and DIP digit tightness/stiffness limiting the formulation of bilateral composites fists during this progress reporting period limiting. Digit flexion to the [facility]: R:  2nd: 4 cm(3cm), 3rd: 6cm( 6cm), 4th: 6 cm (5cm), 5th: 5cm(5cm), L: 2nd: 8cm(8cm), 3rd: 8cm(6cm), 4th: 8cm(7cm), 5th: 6cm(6cm) 09/20/2022: Improving digit flexion to the [facility] 09/13/22: improved digit flexion to the [facility]. 07/05/2022: Pt. presents with digit MP, PIP, and DIP extensor tightness limiting her ability to securely grip objects in her bilateral hands.  04/07/2022: Pt. Has improved with right 2nd, and 3rd digit flexion towards the [facility]. Pt. Continues to have difficulty securely holding and applying deodorant.02/03/2022: Pt. has improved with digit flexion, however, continues to have difficulty securely holding and using deodorant. 12/09/2021: Bilateral hand/digit MP, PIP, and DIP extension tightness limits her ability achieve digit flexion to hold, and apply deodorant. 10/28/2021:  Pt. continues to have difficulty holding the deodorant. 01/12/2021: Pt. presents with limited digit extension. Pt. Is able to initiate holding deodorant, however is unable to hold it while using it  Goal status:  Achieved   4.  Pt. Will improve bilateral wrist extension in preparation for  anticipating, and initiating reaching for objects at the table.  Baseline: 02/01/24: Pt. Continues to present with tightness  however presents with more tightness in the left wrist. 11/30/23: Continue 11/02/23: Wrist extension: R: 30(38), L: -16(18) 07/13/23: Wrist extension: R: 50(64), L: -4(18) 05/25/23: Wrist extension: R: 46(64), L: -8(14) 04/04/23: R: 46(64) L: -10(10)01/19/2023:  R: 46(64) L: 0(20) 12/06/2022: Pt continues to to progress with bilateral wrist extension in preparation fro functional reaching. 10/27/2022: Pt. Is improving with bilateral wrist extension. R:  46(64) L: 0(20) 09/20/2022: limited PROM in the Left wrist extension 2/2 tightness/stiffness. 09/13/22: R: 55(68), L: 0(12) 07/19/2022: Bilateral wrist extension in limited. 07/05/2022: Right: 40(60) Left: 0(12) 04/07/2022: Right: 40(55) Left: 5(18) 02/03/2022: Right: 34(55) Left: 3(4) 12/09/2021: Right  34(55)  Left  3(4) 10/28/2021: Right 22(38), Left 0(15) 09/14/2021:  Right: 17(35), left 2(15)  Goal status: Ongoing  7.  Pt. Will increase bilateral lateral pinch strength by 2 lbs to be able to securely grasp items during ADLs, and IADL tasks.  Baseline: 01/19/2023: Deferred as the Pinch meter is out for calibration.12/06/2022: Pt. Is able to more securely hold objects during ADLs/IADLs 10/27/2022: Pt. Is improving with holding items with her bilateral thumbs. (Pinch meter out for calibration) 09/20/2022: lateral pinch continues to be limited 09/13/22: R 6.5# L 4# 07/19/2022: TBD 07/05/2022: TBD 07/2022: NT-Pinch meter out for calibration. 02/03/2022: Right: 6#, Left: 4# 12/09/2021: Right: 6#, Left: 4#  Goal status: Deferred   9.  Pt. will complete plant care with modified independence.  Baseline:12/06/2022: Pt. Is independent with plant care in her husband's room, however is not able to access her plants due to furniture blocking access to them. 10/27/2022: Pt. Is able to water plants that are closer, and within reach. Pt. continues to have difficulty  reaching for thorough plant care. 09/20/2022: Pt. Continues to have difficulty reaching for thorough plant care. 09/13/22: continues to report intermittent difficulty 07/19/2022: Pt. Continues to be able to water, and care for some of her plants. Pt. Has more difficulty with plants that are harder to reach. 07/05/2022: Pt. is able to water, and care for some of her plants. Pt. Has more difficulty with plants that are harder to reach. 04/07/2022: Pt. is now able to hold a cup and water her plants.02/03/2022: Pt. has difficulty caring for her plants.  Goal status:Achieved   10.  Pt. will demonstrate adaptive techniques to assist with the efficiency of self-dressing, or morning care tasks.  Baseline: 07/13/23:Pt. continues to work on improving UE functioning to be able to efficiently use Adaptive equipment 05/25/2023: Pt. continues to work on improving UE functioning to be able to efficiently use Adaptive equipment.  04/04/2023:  Pt. continues to work on improving UE functioning to be able to efficiently use Adaptive equipment. 01/19/2203: Pt. Is able to donn her jacket in reverse independently. Pt. requires assist from staff, as Pt. Reports staff have decreased time. 12/06/2022: Pt. requires assist from staff, as Pt. Reports staff have decreased time. 10/27/2022: Pt. is able to assist with initiating UE dressing. Pt. requires assist from personal, and staff care aides. 09/20/2022: Pt. continues to require assist form personal, and staff care aides. 09/13/22: MODA dressing, reports she is often rushed 07/19/2022: Pt. continues to require assist with self-dressing/morning care tasks. 07/05/2022: Continue with goal. 04/07/2022: Pt. Continues to require assist with the efficiency of self-dressing, and morning care tasks.02/03/2022: Pt. requires assist from caregivers 2/2 time limitations during morning care.  Goal status:  Deferred- Has caregiver assist and morning routine in place.  11.  Pt. will improve BUE strength to improve  ADL, and IADL functioning.  Baseline: 02/01/24: Bilateral UE strength continues to be limited 11/30/2023: Continue 11/02/23: flexion L 4-/5, R 4-/5; shoulder abduction L 4-/5, R 4-/5. elbow flexion B 4/5,01/19/2023: L 4/5, R 3+/5  07/13/23: flexion L 4/5, R 4-/5; shoulder abduction L 4+/5, R 4-/5. elbow flexion B 5/5, elbow extension 4+/5 01/19/2023: L 4/5, R 3+/5  05/25/2023: shoulder flexion L 4/5, R 4-/5; shoulder abduction L 4+/5,  R 4-/5. elbow flexion B 5/5, elbow extension 4+/5 01/19/2023: L 4/5, R 3+/5  04/04/2023: shoulder flexion L 4/5, R 3+/5; shoulder abduction L 4+/5, R 3+/5. elbow flexion B 5/5, elbow extension 4/5 01/19/2023: L 4/5, R 3+/5 12/06/2022: Continue 10/27/2022: shoulder flexion L 4/5, R 3+/5; shoulder abduction L 4+/5, R 3+/5. elbow flexion B 5/5, elbow extension 4/5 09/20/2022: Pt. Presents with limited BUE strength 09/13/22: shoulder flexion L 4/5, R 3/5; elbow flexion B 5/5, elbow extension 4-/5; shoulder abduction L 4+/5, R 3+/5. 07/19/2022: BUE strength continues to be limited. 07/05/2022: shoulder flexion: right 4-/5, abduction: 3+/5, elbow flexion: right: 5/5, left 5/5, extension: right: 4-/5, left 4-/5, wrist extension: right: 3-/5, left: 2-/5  Goal status: Ongoing  12.  Pt. will improve bilateral thumb radial abduction in order to be able to hold the grab bars while standing with PT  Baseline: 02/01/24: Pt. Continues to improve with attempting more thumb abduction needed to reach, and grasp items.11/30/2023: Continue 11/02/23: Thumb radial abduction: R: 28(50), L:28(30) 07/13/23: Pt. Is improving with holding the grab bars with the right hand .05/25/23: Thumb radial abduction: R: 38(50), L:28(30) 04/04/23: R: 38(48), L:28(30) 01/19/2023: R: 38(48) L: 24(26)12/06/2022: Pt. Continues to present  with limited thumb abduction, however is improving holding onto the parallel bars.10/27/2022: thumb radial abduction: R: 30(42), L: 20(24  Goal status: Ongoing  13.  Pt. Will be able to securely  hold, and stabilize medication bottles at a tabletop surface when opening, and closing them.  Baseline: 01/19/2023: Pt. Is now able to securely hold medication bottles while opening them, and stabilizes them on surfaces while opening them. 12/06/2022: Pt is improving with securely stabilizing medication bottles. 10/27/2022: Pt. Stabilizes bottles against her torso when attempting to open, and close them   Goal status: Achieved  14: Pt. Will improve bilateral thumb IP flexion to improve active grasping patterns.    Baseline: 02/01/24: pt. Presents with tightness with bilateral thumb IP flexion.11/30/2023: Continue 11/02/23: Thumb IP flexion: R: 42(55) L: 10(25) 07/13/23: Thumb IP flexion: R: 42(50) L: 30(45) 05/25/2023: Thumb IP flexion: R: 40(50) L: 30(45) 04/04/2023:  R: 40(50) L: 25(45) 01/19/2023: Right 35(50), Left: 20/45)    Goal status: Ongoing    15. Pt. Will improve bilateral FMC/speed, and dexterity. As evidence by improved scores on the 9 hole peg test. Baseline: 02/01/24: TBD 11/30/2023: Limited bilateral fine motor coordination;continue 11/02/23: 2 min. & 51 sec. 07/13/2023: Right: 9 pegs placed and removed in 2 min. & 11 sec. 05/25/2023: 9 Hole pegs test speed/dexterity: Right: 9 pegs placed and removed in 2 min. & 41 sec. Left: Unable to grasp the pegs for a horizontal position. 04/04/23: Pt. was able to consistently grasp the pegs however each one slipped out of her fingers when attempting to place them into the Pegboard. 01/19/2023: 2 pegs placed in 1 min. & 5 sec.  Pt. was able to remove 9 pegs in 17 sec.; Left: 2 pegs placed in 1 min. & 10 sec. Pt. was able to remove 9 pegs in 20 sec.   Goal status: Ongoing    16. Pt. Will donn/doff a jacket with modified independence   Baseline: 02/01/24: 11/30/2023: Continue 11/02/23: MaxA   Goal status: Ongoing     ASSESSMENT:  CLINICAL IMPRESSION:  Pt. Tolerated treatment well today with her BP 109/72 and HR 74 bpms. Pt. Has presented with  multiple personal and medical set-backs over this recertification period affecting attendance, and overall progress. Pt. has sustained a left ankle fracture, Pt. has  been experiencing low BP,  and has had a death in the family with the passing of her husband. Pt. Has returned for therapy since, and continues to present with fluctuating BP. Pt. Continues to benefit from working towards the same POC and goals into the next recertification period as Pt. Has had limited opportunity to consistently work towards these goals this recertification period.  Plan to perform 10 visit progress report at the next treatment session. Pt. continues to benefit from OT services to work on impoving ROM, and UE strength in order to work towards increasing bilateral hand grasp on objects, and increasing engagement of bilateral hands during ADLs, and IADL tasks    PERFORMANCE DE PERFORMANCE DEFICITS: in functional skills including ADLs, IADLs, coordination, dexterity, ROM, strength, and UE functional use, cognitive skills including , and psychosocial skills including coping strategies and environmental adaptation.   IMPAIRMENTS: are limiting patient from ADLs, IADLs, and leisure.   CO-MORBIDITIES: may have co-morbidities  that affects occupational performance. Patient will benefit from skilled OT to address above impairments and improve overall function.  MODIFICATION OR ASSISTANCE TO COMPLETE EVALUATION: Min-Moderate modification of tasks or assist with assess necessary to complete an evaluation.  OT OCCUPATIONAL PROFILE AND HISTORY: Detailed assessment: Review of records and additional review of physical, cognitive, psychosocial history related to current functional performance.  CLINICAL DECISION MAKING: Moderate - several treatment options, min-mod task modification necessary  REHAB POTENTIAL: Good for stated goals  PLAN:  OT FREQUENCY 2x's a week  OT DURATION: 12 weeks  PLANNED INTERVENTIONS ADL training, A/E  training, UE ther. Ex, Manual therapy, neuromuscular re-education, moist heat modality, Paraffin Bath, Splinting, and  Pt./caregiver education   RECOMMENDED OTHER SERVICES: PT  CONSULTED AND AGREED WITH PLAN OF CARE: Patient  PLAN FOR NEXT SESSION: Continue Treatment as per established POC  Richardson Otter, MS, OTR/L  02/01/2024, 3:27 PM       "

## 2024-02-06 ENCOUNTER — Ambulatory Visit: Attending: Internal Medicine | Admitting: Occupational Therapy

## 2024-02-06 ENCOUNTER — Ambulatory Visit

## 2024-02-06 DIAGNOSIS — M6281 Muscle weakness (generalized): Secondary | ICD-10-CM | POA: Diagnosis present

## 2024-02-06 NOTE — Therapy (Signed)
 " Occupational Therapy Progress Note  Dates of reporting period  11/30/23   to   02/06/24   Patient Name: Sherry [name] MRN: [id number] DOB:Nov 23, 1934, 89 y.o., female Today's Date: 11/29/2022  PCP:  [facility] PROVIDER: Abdul Fine, MD  END OF SESSION:   OT End of Session - 02/06/24 1111     Visit Number 150    Number of Visits 192    Date for Recertification  04/25/24    OT Start Time 1100    OT Stop Time 1145    OT Time Calculation (min) 45 min    Equipment Utilized During Treatment powered wheelchair    Activity Tolerance Patient tolerated treatment well    Behavior During Therapy WFL for tasks assessed/performed              Past Medical History:  Diagnosis Date   Acute blood loss anemia    Arthritis    Cancer (HCC)    skin   Central cord syndrome at C6 level of cervical spinal cord (HCC) 11/29/2017   Hypertension    Protein-calorie malnutrition, severe (HCC) 01/24/2018   S/P insertion of IVC (inferior vena caval) filter 01/24/2018   Tetraparesis (HCC)    Past Surgical History:  Procedure Laterality Date   ANTERIOR CERVICAL DECOMP/DISCECTOMY FUSION N/A 11/29/2017   Procedure: Cervical five-six, six-seven Anterior Cervical Decompression Fusion;  Surgeon: Cheryle Debby DELENA, MD;  Location: [facility] OR;  Service: Neurosurgery;  Laterality: N/A;  Cervical five-six, six-seven Anterior Cervical Decompression Fusion   CATARACT EXTRACTION     FEMUR IM NAIL Left 08/16/2021   Procedure: INTRAMEDULLARY (IM) NAIL FEMORAL;  Surgeon: Cleotilde Barrio, MD;  Location: [facility] ORS;  Service: Orthopedics;  Laterality: Left;   IR IVC FILTER PLMT / S&I /IMG GUID/MOD SED  12/08/2017   Patient Active Problem [name]   Diagnosis Date Noted   Age-related osteoporosis without current pathological fracture 07/27/2022   Mild recurrent major depression (HCC) 07/27/2022   Hypertensive heart disease with other congestive heart failure (HCC) 02/14/2022   Chronic indwelling Foley catheter  02/14/2022   Closed hip fracture (HCC) 08/15/2021   DVT (deep venous thrombosis) (HCC) 08/15/2021   Quadriplegia (HCC) 08/15/2021   Fall 08/15/2021   Constipation due to slow transit 08/31/2018   Trauma 06/05/2018   Neuropathic pain 06/05/2018   Neurogenic bowel 06/05/2018   Vaginal yeast infection 01/30/2018   Healthcare-associated pneumonia 01/25/2018   Chronic allergic rhinitis 01/24/2018   Depression with anxiety 01/24/2018   UTI due to Klebsiella species 01/24/2018   Protein-calorie malnutrition, severe (HCC) 01/24/2018   S/P insertion of IVC (inferior vena caval) filter 01/24/2018   Tetraparesis (HCC) 01/20/2018   Neurogenic bladder 01/20/2018   Reactive depression    Benign essential HTN    Acute postoperative anemia due to expected blood loss    Central cord syndrome at C6 level of cervical spinal cord (HCC) 11/29/2017   Allergy to alpha-gal 11/25/2016   SCC (squamous cell carcinoma) 04/08/2014   ONSET DATE: 11/29/2017  REFERRING DIAG: Central Cord Syndrome at C6 of the Cervical Spinal Cord, Fall with Bilateral Closed Hip Fractures, with ORIF repair with Intramedullary Nailing of the Left Hip Fracture.   THERAPY DIAG:  Muscle weakness (generalized)  Other lack of coordination  Rationale for Evaluation and Treatment: Rehabilitation  SUBJECTIVE:  SUBJECTIVE STATEMENT:  Patient reports that she has  a follow-up orthopedic appointment coming up.  PERTINENT HISTORY: Central Cord Syndrome at C6 of the Cervical Spinal Cord, Fall with Bilateral Closed Hip Fractures,  with ORIF repair with Intramedullary Nailing of the Left Hip Fracture.   PRECAUTIONS: None  WEIGHT BEARING RESTRICTIONS: No  PAIN:  Are you having pain? No pain   LIVING ENVIRONMENT: Lives with: [name] Stairs: No Has following equipment at home: Wheelchair (power)  PLOF: Independent   PATIENT GOALS:  See below for established goals  OBJECTIVE:  Note: Objective measures  were completed at Evaluation unless otherwise noted.  HAND DOMINANCE: Right  ADLs: Caregiver assist with ADLs, and [facility]  MOBILITY STATUS: Uses a power w/c  ACTIVITY TOLERANCE: Activity tolerance: Fair  FUNCTIONAL OUTCOME MEASURES:  Measurements:   12/09/2021:   Shoulder flexion: R: 120(136), L: 138(150) Shoulder abduction: R: 108(120), L: 110(120) Elbow: R: 0-148, L: 0-146 Wrist flexion: R: 55(62), L: 78 Writ extension: R: 34(55), L: 3(4) RD: R: 12(20), L: 16(26) UD: R: 8(24), L: 6(18) Thumb radial abduction: R: 11(20), L: 8(20) Digit flexion to the [facility]: R:  2nd: 5cm(3.5cm), 3rd: 7.5 cm(5cm), 4th: 7cm(3cm), 5th: 6cm(5.5cm) L: 2nd: 8cm(8cm), 3rd: 8cm(8cm), 4th: 7.5cm(7cm), 5th: 7cm(7cm)   02/03/2022:   Shoulder flexion: R: 122(138), L: 148(154) Shoulder abduction: R: 109(125), L: 125(125 Elbow: R: 0-148, L: 0-146 Wrist flexion: R: 60(68), L: 79 Writ extension: R: 40(55), L: 3(10) RD: R: 14(24), L: 20(28) UD: R: 8(24), L: 6(18) Thumb radial abduction: R: 20(25), L: 8(20) Digit flexion to the [facility]: R:  2nd: 4.5cm(3cm), 3rd: 6.5cm(4.5cm), 4th: 6.5 cm(3cm), 5th: 4.5cm(5cm) L: 2nd: 8cm(8cm), 3rd: 8cm(7cm), 4th: 7.5cm(7cm), 5th: 6.5cm(6.5cm)   04/07/2022:   Shoulder flexion: R: 125(150), L: 160(160) Shoulder abduction: R: 109(125), L: 125(125 Elbow: R: 0-148, L: 0-146 Wrist flexion: R: 60(68), L: 79 Writ extension: R: 40(55), L: 5(18) RD: R: 14(24), L: 20(28) UD: R: 18(24), L: 8(18) Thumb radial abduction: R: 20(25), L: 8(20) Digit flexion to the [facility]: R:  2nd: 4.5 cm(4 cm), 3rd: 6.5cm(3cm), 4th: 7 cm (5cm), 5th: 5cm(5cm) L: 2nd: 8cm(8cm), 3rd: 8cm(7cm), 4th: 7.5cm(7cm), 5th: 7cm(7cm)   07/05/2022:   Shoulder flexion: R: 125(154), L: 160(160) Shoulder abduction: R: 95(130), L: 143(143) Elbow: R: 0-148, L: 0-146 Wrist flexion: R: 60(68), L: 79 Writ extension: R: 40(60), L: 0(12) RD: R: 10(24), L: 12(20) UD: R: 16(24), L: 8(16) Thumb radial abduction: R:  20(25), L: 8(20) Digit flexion to the [facility]: R:  2nd: 4 cm(4cm), 3rd: 6.5cm( 3.5cm), 4th: 7 cm (5cm), 5th: 6cm(5cm) L: 2nd: 8cm(8cm), 3rd: 8cm(7cm), 4th: 8cm(7cm), 5th: 7cm(7cm)   09/13/2022:   Shoulder flexion: R: 125(154), L: 160(160) Shoulder abduction: R: 100(130), L: 143(143) Elbow: R: 0-150, L: 0-150 Wrist flexion: R: 55(68), L: 79 Writ extension: R: 45(60), L: 0(12) RD: R: 10(24), L: 12(20) UD: R: 16(24), L: 8(16) Thumb radial abduction: R: 20(25), L: 8(20) Digit flexion to the [facility]: R:  2nd: 4 cm(4cm), 3rd: 6cm( 5cm), 4th: 6 cm (4cm), 5th: 5cm(5cm) L: 2nd: 7cm(cm), 3rd: 7cm(6cm), 4th: 7cm(6cm), 5th: 6cm(6cm)   10/27/2022   Shoulder flexion: R: 128(154), L: 160(160) Shoulder abduction: R: 105(130), L: 143(143) Elbow: R: 0-150, L: 0-150 Wrist flexion: R: 55(68), L: 79 Writ extension: R: 46(64), L: 0(20) RD: R: 10(20), L: 18(24) UD: R: 18(28), L: 8(20) Thumb radial abduction: R: 30(42), L:20(24) Digit flexion to the [facility]: R:  2nd: 4 cm(3cm), 3rd: 6cm( 6cm), 4th: 6 cm (5cm), 5th: 5cm(5cm) L: 2nd: 8cm(8cm), 3rd: 8cm(6cm), 4th: 8cm(7cm), 5th: 6cm(6cm)  01/19/2023  Shoulder flexion: R: 131(154), L: 160(160) Shoulder abduction: R: 110(130), L: 143(143) Elbow: R: 0-150, L: 0-150 Wrist flexion: R:  55(68), L: 79 Writ extension: R: 46(64), L: 0(20) RD: R: 10(20), L: 18(24) UD: R: 18(28), L: 14(20) Thumb radial abduction: R: 38(48), L:24(26) Thumb IP flexion: R: 35(50) L: 20(45) Digit flexion to the [facility]: R:  2nd: 3.5 cm(3cm), 3rd: 6cm( 6cm), 4th: 6 cm (5cm), 5th: 5cm(4.5cm) L: 2nd: 8cm(7.5cm), 3rd: 8cm(6cm), 4th: 7.5cm(6cm), 5th: 6cm(4.5cm)  04/04/2023  Shoulder flexion: R: 134(154), L: 160(160) Shoulder abduction: R: 114(130), L: 143(143) Elbow: R: WNL, L: WNL Wrist flexion: R: 55(68), L: 80 Writ extension: R: 46(64), L: -10(10) RD: R: 14(24), L: 20(24) UD: R: 20(30), L: 14(20) Thumb radial abduction: R: 38(48), L:28(30) Thumb IP flexion: R: 40(50) L: 25(45) Digit  flexion to the [facility]: R:  2nd: 3.5 cm(3cm), 3rd: 6cm( 5cm), 4th: 6 cm (4cm), 5th: 5cm(5cm) L: 2nd: 8cm(7.5cm), 3rd: 8cm(7cm), 4th: 7.5cm(7cm), 5th: 7cm(6cm)  05/25/2023  Shoulder flexion: R: 134(154), L: 160(160) Shoulder abduction: R: 117(134), L: 143(143) Elbow: R: WNL, L: WNL Wrist flexion: R: 55(68), L: 80 Wrist extension: R: 46(64), L: -8(14) RD: R: 14(24), L: 20(24) UD: R: 20(32), L: 14(22) Thumb radial abduction: R: 38(50), L:28(30) Thumb IP flexion: R: 40(50) L: 30(45) Digit flexion to the [facility]: R:  2nd: 3.5 cm(3cm), 3rd: 6cm( 5cm), 4th: 7 cm (4cm), 5th: 5cm(4cm) L: 2nd: 8cm(7.5cm), 3rd: 8cm(7cm), 4th: 7.5cm(7cm), 5th: 7cm(6cm)  07/13/23  Shoulder flexion: R: 134(154), L: 160(160) Shoulder abduction: R: 122(134), L: 143(143) Elbow: R: WNL, L: WNL Wrist flexion: R: 55(68), L: 80 Wrist extension: R: 50(64), L: -4(18) RD: R: 18(24), L: 24(24) UD: R: 20(32), L: 14(22) Thumb radial abduction: R: 38(50), L:32(30) Thumb IP flexion: R: 42(50) L: 30(45) Digit flexion to the [facility]: R:  2nd: 3.5 cm(3cm), 3rd: 6cm( 5cm), 4th: 7 cm (4cm), 5th: 5cm(4cm) L: 2nd: 8cm(7.5cm), 3rd: 8cm(7cm), 4th: 7.5cm(7cm), 5th: 7cm(6cm)  11/02/23:   Shoulder flexion: R: 136(154), L: 160(160) Shoulder abduction: R: 123(134), L: 143(143) Elbow: R: WNL, L: WNL Wrist flexion: R: 55(68), L: 80 Wrist extension: R: 30(38), L: -16(18) RD: R: 8(18), L: 18(24) UD: R: 12(24), L: 8(22) Thumb radial abduction: R: 28(50), L:28(30) Thumb IP flexion: R: 42(55) L: 10(25) Digit flexion to the [facility]: R:  2nd: 4.5cm(4cm), 3rd: 6cm( 4.5cm), 4th: 6.5cm (5cm), 5th: 5cm(4.5cm) L: 2nd: 8cm(7.5cm), 3rd: 7.5cm(7cm), 4th: 7cm(7cm), 5th: 6cm(6cm)  02/06/24:  Shoulder flexion: R: 136(154), L: 160(160) Shoulder abduction: R: 123(134), L: 143(143) Elbow: R: WNL, L: WNL Wrist flexion: R: 55(68), L: 80 Wrist extension: R: 36(42), L: -10(22) RD: R: 8(18), L: 22(24) UD: R: 18(24), L: 10(22) Thumb radial abduction: R: 40 (50),  L:30(32) Thumb IP flexion: R: 42(55) L: 15(25) Digit flexion to the [facility]: R:  2nd: 4.5cm(4cm), 3rd: 6cm( 4.5cm), 4th: 6.5cm (5cm), 5th: 5cm(4.5cm) L: 2nd: 7.5cm(7.5cm), 3rd: 7cm(6.5cm), 4th: 7cm(6.5cm), 5th: 5cm(5cm)       COORDINATION:   01/19/2023:  9 hole peg test:  Right: 2 pegs placed in 1 min. & 5 sec.  Pt. was able to remove 9 pegs in 17 sec.  Left: 2 pegs placed in 1 min. & 10 sec. Pt. was able to remove 9 pegs in 20 sec.  04/04/23:  Pt. was able to consistently grasp the pegs however each one slipped out of her fingers when attempting to place them into the Pegboard.  05/25/2023:   Right: 9 pegs placed and removed in 2 min. & 41 sec. Left: Unable to grasp the pegs for a horizontal position.  07/13/23    Right: 9 pegs placed and removed in 2 min. &  11 sec.     11/02/23:                Right 9 Hole Peg Test: 2 min. & 51 sec.   02/06/24:   Right 9 Hole Peg Test: 2 min. & 28 sec.with the board set-up on a laptop pillow    SENSATION: Intact  EDEMA: N/A   COGNITION: Overall cognitive status: WNL   VISION: Subjective report: Wears glasses, No changes in vision.  PERCEPTION: Intact  TODAY'S TREATMENT:                                                                                                                              DATE: 02/06/24  Measurements were obtained, and goals were reviewed with the Pt.   Therapeutic Ex.:    -Pt. tolerated PROM with slow prolonged gentle stretching to the bilateral wrist extension with increased focus on left wrist extension. AAROM/PROM for bilateral digit MP, PIP, and DIP flexion, and extension, thumb abduction was performed in conjunction with moist heat modality 2/2 increased extensor tone, and tightness in the left wrist, and bilateral digits.     PATIENT EDUCATION: Education details: Bilateral hand  ROM, condition management, self-care: Condition Management: BP Person educated: Patient Education method: Explanation,  Demonstration, and Verbal cues Education comprehension: verbalized understanding and returned demonstration  HOME EXERCISE PROGRAM: Continue to assess HEP needs, and provide as needed.   GOALS: Goals reviewed with patient? Yes  LONG TERM GOALS: Target date: 04/25/2024  3.  Patient will independently use a reacher to pick up items from various surfaces.  Baseline: 11/02/23: Pt. is now able to consistently pick up small objects from the floor with a reacher. 07/13/2023: Pt. is starting to use a reacher to retrieve items with an adaptation/modification to the trigger. 05/25/2023:  Pt. is now able to initiate grasping a reacher to open and close the reacher with the right hand, however requires the grasp handle to be repositioned  within the hand. Pt. requires work on using/moving the reacher with the right hand through various planes. 04/04/2023: Pt. continues to work on improving Digit flexion to the [facility] to be able to efficiently handle Adaptive equipment. 01/19/2023: Pt. Is improving with Bilateral digit flexion to the [facility]. Pt. has difficulty firmly holding adaptive devices. 12/06/2022: Pt. continues to present with increased MP, PIP, and DIP digit tightness/stiffness limiting the formulation of bilateral composites fists. 10/27/2022: Pt. presents with increased MP, PIP, and DIP digit tightness/stiffness limiting the formulation of bilateral composites fists during this progress reporting period limiting. Digit flexion to the [facility]: R:  2nd: 4 cm(3cm), 3rd: 6cm( 6cm), 4th: 6 cm (5cm), 5th: 5cm(5cm), L: 2nd: 8cm(8cm), 3rd: 8cm(6cm), 4th: 8cm(7cm), 5th: 6cm(6cm) 09/20/2022: Improving digit flexion to the [facility]. 09/13/22: improved digit flexion to the [facility]. 07/05/2022: Pt. presents with digit MP, PIP, and DIP extensor tightness limiting her ability to securely grip objects in her bilateral hands.  04/07/2022: Pt. Has improved with  right 2nd, and 3rd digit flexion towards the [facility]. Pt. Continues to have difficulty securely  holding and applying deodorant.02/03/2022: Pt. has improved with digit flexion, however, continues to have difficulty securely holding and using deodorant. 12/09/2021: Bilateral hand/digit MP, PIP, and DIP extension tightness limits her ability achieve digit flexion to hold, and apply deodorant. 10/28/2021:  Pt. continues to have difficulty holding the deodorant. 01/12/2021: Pt. presents with limited digit extension. Pt. Is able to initiate holding deodorant, however is unable to hold it while using it  Goal status:  Achieved   4.  Pt. Will improve bilateral wrist extension in preparation for anticipating, and initiating reaching for objects at the table.  Baseline: 02/06/24: Wrist extension: R: 36(42), L: -10(22) 2/31/25: Pt. Continues to present with tightness  however presents with more tightness in the left wrist. 11/30/23: Continue 11/02/23: Wrist extension: R: 30(38), L: -16(18) 07/13/23: Wrist extension: R: 50(64), L: -4(18) 05/25/23: Wrist extension: R: 46(64), L: -8(14) 04/04/23: R: 46(64) L: -10(10)01/19/2023:  R: 46(64) L: 0(20) 12/06/2022: Pt continues to to progress with bilateral wrist extension in preparation fro functional reaching. 10/27/2022: Pt. Is improving with bilateral wrist extension. R: 46(64) L: 0(20) 09/20/2022: limited PROM in the Left wrist extension 2/2 tightness/stiffness. 09/13/22: R: 55(68), L: 0(12) 07/19/2022: Bilateral wrist extension in limited. 07/05/2022: Right: 40(60) Left: 0(12) 04/07/2022: Right: 40(55) Left: 5(18) 02/03/2022: Right: 34(55) Left: 3(4) 12/09/2021: Right  34(55)  Left  3(4) 10/28/2021: Right 22(38), Left 0(15) 09/14/2021:  Right: 17(35), left 2(15)  Goal status: Ongoing  7.  Pt. Will increase bilateral lateral pinch strength by 2 lbs to be able to securely grasp items during ADLs, and IADL tasks.  Baseline: 01/19/2023: Deferred as the Pinch meter is out for calibration.12/06/2022: Pt. Is able to more securely hold objects during ADLs/IADLs 10/27/2022: Pt. Is improving  with holding items with her bilateral thumbs. (Pinch meter out for calibration) 09/20/2022: lateral pinch continues to be limited 09/13/22: R 6.5# L 4# 07/19/2022: TBD 07/05/2022: TBD 07/2022: NT-Pinch meter out for calibration. 02/03/2022: Right: 6#, Left: 4# 12/09/2021: Right: 6#, Left: 4#  Goal status: Deferred   9.  Pt. will complete plant care with modified independence.  Baseline:12/06/2022: Pt. Is independent with plant care in her husband's room, however is not able to access her plants due to furniture blocking access to them. 10/27/2022: Pt. Is able to water plants that are closer, and within reach. Pt. continues to have difficulty reaching for thorough plant care. 09/20/2022: Pt. Continues to have difficulty reaching for thorough plant care. 09/13/22: continues to report intermittent difficulty 07/19/2022: Pt. continues to be able to water, and care for some of her plants. Pt. Has more difficulty with plants that are harder to reach. 07/05/2022: Pt. is able to water, and care for some of her plants. Pt. Has more difficulty with plants that are harder to reach. 04/07/2022: Pt. is now able to hold a cup and water her plants.02/03/2022: Pt. has difficulty caring for her plants.  Goal status:Achieved   10.  Pt. will demonstrate adaptive techniques to assist with the efficiency of self-dressing, or morning care tasks.  Baseline: 07/13/23:Pt. continues to work on improving UE functioning to be able to efficiently use Adaptive equipment 05/25/2023: Pt. continues to work on improving UE functioning to be able to efficiently use Adaptive equipment.  04/04/2023:  Pt. continues to work on improving UE functioning to be able to efficiently use Adaptive equipment. 01/19/2203: Pt. Is able to donn her jacket in reverse independently. Pt. requires  assist from staff, as Pt. Reports staff have decreased time. 12/06/2022: Pt. requires assist from staff, as Pt. Reports staff have decreased time. 10/27/2022: Pt. is able to assist  with initiating UE dressing. Pt. requires assist from personal, and staff care aides. 09/20/2022: Pt. continues to require assist form personal, and staff care aides. 09/13/22: MODA dressing, reports she is often rushed 07/19/2022: Pt. continues to require assist with self-dressing/morning care tasks. 07/05/2022: Continue with goal. 04/07/2022: Pt. Continues to require assist with the efficiency of self-dressing, and morning care tasks.02/03/2022: Pt. requires assist from caregivers 2/2 time limitations during morning care.  Goal status:  Deferred- Has caregiver assist and morning routine in place.  11.  Pt. will improve BUE strength to improve ADL, and IADL functioning.  Baseline:02/06/24: flexion L 4-/5, R 4-/5; shoulder abduction L 4-/5, R 4-/5. elbow flexion B 4/5. 02/01/24: Bilateral UE strength continues to be limited 11/30/2023: Continue 11/02/23: flexion L 4-/5, R 4-/5; shoulder abduction L 4-/5, R 4-/5. elbow flexion B 4/5,01/19/2023: L 4/5, R 3+/5  07/13/23: flexion L 4/5, R 4-/5; shoulder abduction L 4+/5, R 4-/5. elbow flexion B 5/5, elbow extension 4+/5 01/19/2023: L 4/5, R 3+/5  05/25/2023: shoulder flexion L 4/5, R 4-/5; shoulder abduction L 4+/5, R 4-/5. elbow flexion B 5/5, elbow extension 4+/5 01/19/2023: L 4/5, R 3+/5  04/04/2023: shoulder flexion L 4/5, R 3+/5; shoulder abduction L 4+/5, R 3+/5. elbow flexion B 5/5, elbow extension 4/5 01/19/2023: L 4/5, R 3+/5 12/06/2022: Continue 10/27/2022: shoulder flexion L 4/5, R 3+/5; shoulder abduction L 4+/5, R 3+/5. elbow flexion B 5/5, elbow extension 4/5 09/20/2022: Pt. Presents with limited BUE strength 09/13/22: shoulder flexion L 4/5, R 3/5; elbow flexion B 5/5, elbow extension 4-/5; shoulder abduction L 4+/5, R 3+/5. 07/19/2022: BUE strength continues to be limited. 07/05/2022: shoulder flexion: right 4-/5, abduction: 3+/5, elbow flexion: right: 5/5, left 5/5, extension: right: 4-/5, left 4-/5, wrist extension: right: 3-/5, left: 2-/5  Goal status:  Ongoing  12.  Pt. will improve bilateral thumb radial abduction in order to be able to hold the grab bars while standing with PT  Baseline:02/06/24:Thumb radial abduction: R: 40 (50), L:30(32) Pt. is improving with thumb radial abduction during grasping, however has ankle cast in place therefore is unable to work on grasping parallel bars in standing.02/01/24: Pt. continues to improve with attempting more thumb abduction needed to reach, and grasp items.11/30/2023: Continue 11/02/23: Thumb radial abduction: R: 28(50), L:28(30) 07/13/23: Pt. Is improving with holding the grab bars with the right hand 05/25/23: Thumb radial abduction: R: 38(50), L:28(30) 04/04/23: R: 38(48), L:28(30) 01/19/2023: R: 38(48) L: 24(26)12/06/2022: Pt. Continues to present  with limited thumb abduction, however is improving holding onto the parallel bars.10/27/2022: thumb radial abduction: R: 30(42), L: 20(24  Goal status: Ongoing  13.  Pt. will be able to securely hold, and stabilize medication bottles at a tabletop surface when opening, and closing them.  Baseline: 01/19/2023: Pt. is now able to securely hold medication bottles while opening them, and stabilizes them on surfaces while opening them. 12/06/2022: Pt is improving with securely stabilizing medication bottles. 10/27/2022: Pt. Stabilizes bottles against her torso when attempting to open, and close them   Goal status: Achieved  14: Pt. Will improve bilateral thumb IP flexion to improve active grasping patterns.    Baseline: 02/06/23: Thumb IP flexion: R: 42(55) L: 15(25)  02/01/24: pt. Presents with tightness with bilateral thumb IP flexion.11/30/2023: Continue 11/02/23: Thumb IP flexion: R: 42(55) L: 10(25) 07/13/23: Thumb IP  flexion: R: 42(50) L: 30(45) 05/25/2023: Thumb IP flexion: R: 40(50) L: 30(45) 04/04/2023:  R: 40(50) L: 25(45) 01/19/2023: Right 35(50), Left: 20/45)    Goal status: Ongoing    15. Pt. Will improve bilateral FMC/speed, and dexterity. As evidence by  improved scores on the 9 hole peg test. Baseline: 02/06/24: Right 9 Hole Peg Test: 2 min. & 28 sec.with the board set-up on a laptop pillow 02/01/24: TBD 11/30/2023: Limited bilateral fine motor coordination;continue 11/02/23: 2 min. & 51 sec. 07/13/2023: Right: 9 pegs placed and removed in 2 min. & 11 sec. 05/25/2023: 9 Hole pegs test speed/dexterity: Right: 9 pegs placed and removed in 2 min. & 41 sec. Left: Unable to grasp the pegs for a horizontal position. 04/04/23: Pt. was able to consistently grasp the pegs however each one slipped out of her fingers when attempting to place them into the Pegboard. 01/19/2023: 2 pegs placed in 1 min. & 5 sec.  Pt. was able to remove 9 pegs in 17 sec.; Left: 2 pegs placed in 1 min. & 10 sec. Pt. was able to remove 9 pegs in 20 sec.   Goal status: Ongoing    16. Pt. Will donn/doff a jacket with modified independence   Baseline: 02/01/24: Independent-Pt. Wears her coat in reverse direction. 11/30/2023: Continue 11/02/23: MaxA   Goal status: Achieved.     ASSESSMENT:  CLINICAL IMPRESSION:  Pt. reported having had low BP before leaving [place] this morning, and reports that she took medication to bring it up. Pt.'s BP 91/56 with HR 62 bpms. Measurements were obtained, and goals were reviewed with the Pt. Pt. has made progress with bilateral wrist, digit, and thumb ROM. Pt. has met the goal established for donning her jacket in which she typically uses a reverse method to put it on. Pt. Reports that she is now able to use her bilateral hands to move items off her table, and wipe it down. Pt. Is able to use a reacher.. Pt. continues to benefit from OT services to work on impoving ROM, and UE strength in order to work towards increasing bilateral hand grasp on objects, and increasing engagement of bilateral hands during ADLs, and IADL tasks    PERFORMANCE DE PERFORMANCE DEFICITS: in functional skills including ADLs, IADLs, coordination, dexterity, ROM, strength, and UE  functional use, cognitive skills including , and psychosocial skills including coping strategies and environmental adaptation.   IMPAIRMENTS: are limiting patient from ADLs, IADLs, and leisure.   CO-MORBIDITIES: may have co-morbidities  that affects occupational performance. Patient will benefit from skilled OT to address above impairments and improve overall function.  MODIFICATION OR ASSISTANCE TO COMPLETE EVALUATION: Min-Moderate modification of tasks or assist with assess necessary to complete an evaluation.  OT OCCUPATIONAL PROFILE AND HISTORY: Detailed assessment: Review of records and additional review of physical, cognitive, psychosocial history related to current functional performance.  CLINICAL DECISION MAKING: Moderate - several treatment options, min-mod task modification necessary  REHAB POTENTIAL: Good for stated goals  PLAN:  OT FREQUENCY 2x's a week  OT DURATION: 12 weeks  PLANNED INTERVENTIONS ADL training, A/E training, UE ther. Ex, Manual therapy, neuromuscular re-education, moist heat modality, Paraffin Bath, Splinting, and  Pt./caregiver education   RECOMMENDED OTHER SERVICES: PT  CONSULTED AND AGREED WITH PLAN OF CARE: Patient  PLAN FOR NEXT SESSION: Continue Treatment as per established POC  Richardson Otter, MS, OTR/L  02/06/2024, 2:11 PM       "

## 2024-02-08 ENCOUNTER — Ambulatory Visit: Admitting: Occupational Therapy

## 2024-02-08 ENCOUNTER — Ambulatory Visit

## 2024-02-08 DIAGNOSIS — M6281 Muscle weakness (generalized): Secondary | ICD-10-CM

## 2024-02-08 NOTE — Therapy (Signed)
 " Occupational Therapy Neuro Treatment Note Patient Name: Sherry [name] MRN: [id number] DOB:Jun 30, 1934, 89 y.o., female Today's Date: 11/29/2022  PCP:  [facility] PROVIDER: Abdul Fine, MD  END OF SESSION:   OT End of Session - 02/08/24 1417     Visit Number 151    Number of Visits 192    Date for Recertification  04/25/24    OT Start Time 1105    OT Stop Time 1145    OT Time Calculation (min) 40 min    Activity Tolerance Patient tolerated treatment well    Behavior During Therapy [facility] for tasks assessed/performed              Past Medical History:  Diagnosis Date   Acute blood loss anemia    Arthritis    Cancer (HCC)    skin   Central cord syndrome at C6 level of cervical spinal cord (HCC) 11/29/2017   Hypertension    Protein-calorie malnutrition, severe (HCC) 01/24/2018   S/P insertion of IVC (inferior vena caval) filter 01/24/2018   Tetraparesis (HCC)    Past Surgical History:  Procedure Laterality Date   ANTERIOR CERVICAL DECOMP/DISCECTOMY FUSION N/A 11/29/2017   Procedure: Cervical five-six, six-seven Anterior Cervical Decompression Fusion;  Surgeon: Cheryle Debby DELENA, MD;  Location: [facility] OR;  Service: Neurosurgery;  Laterality: N/A;  Cervical five-six, six-seven Anterior Cervical Decompression Fusion   CATARACT EXTRACTION     FEMUR IM NAIL Left 08/16/2021   Procedure: INTRAMEDULLARY (IM) NAIL FEMORAL;  Surgeon: Cleotilde Barrio, MD;  Location: [facility] ORS;  Service: Orthopedics;  Laterality: Left;   IR IVC FILTER PLMT / S&I /IMG GUID/MOD SED  12/08/2017   Patient Active Problem List   Diagnosis Date Noted   Age-related osteoporosis without current pathological fracture 07/27/2022   Mild recurrent major depression (HCC) 07/27/2022   Hypertensive heart disease with other congestive heart failure (HCC) 02/14/2022   Chronic indwelling Foley catheter 02/14/2022   Closed hip fracture (HCC) 08/15/2021   DVT (deep venous thrombosis) (HCC) 08/15/2021   Quadriplegia  (HCC) 08/15/2021   Fall 08/15/2021   Constipation due to slow transit 08/31/2018   Trauma 06/05/2018   Neuropathic pain 06/05/2018   Neurogenic bowel 06/05/2018   Vaginal yeast infection 01/30/2018   Healthcare-associated pneumonia 01/25/2018   Chronic allergic rhinitis 01/24/2018   Depression with anxiety 01/24/2018   UTI due to Klebsiella species 01/24/2018   Protein-calorie malnutrition, severe (HCC) 01/24/2018   S/P insertion of IVC (inferior vena caval) filter 01/24/2018   Tetraparesis (HCC) 01/20/2018   Neurogenic bladder 01/20/2018   Reactive depression    Benign essential HTN    Acute postoperative anemia due to expected blood loss    Central cord syndrome at C6 level of cervical spinal cord (HCC) 11/29/2017   Allergy to alpha-gal 11/25/2016   SCC (squamous cell carcinoma) 04/08/2014   ONSET DATE: 11/29/2017  REFERRING DIAG: Central Cord Syndrome at C6 of the Cervical Spinal Cord, Fall with Bilateral Closed Hip Fractures, with ORIF repair with Intramedullary Nailing of the Left Hip Fracture.   THERAPY DIAG:  Muscle weakness (generalized)  Other lack of coordination  Rationale for Evaluation and Treatment: Rehabilitation  SUBJECTIVE:  SUBJECTIVE STATEMENT:  Patient reports  doing well today, and that he BP was good at [facility] this morning.  PERTINENT HISTORY: Central Cord Syndrome at C6 of the Cervical Spinal Cord, Fall with Bilateral Closed Hip Fractures, with ORIF repair with Intramedullary Nailing of the Left Hip Fracture.   PRECAUTIONS: None  WEIGHT BEARING  RESTRICTIONS: No  PAIN:  Are you having pain? No pain   LIVING ENVIRONMENT: Lives with: [facility] Stairs: No Has following equipment at home: Wheelchair (power)  PLOF: Independent   PATIENT GOALS:  See below for established goals  OBJECTIVE:  Note: Objective measures were completed at Evaluation unless otherwise noted.  HAND DOMINANCE: Right  ADLs: Caregiver assist  with ADLs, and [facility]  MOBILITY STATUS: Uses a power w/c  ACTIVITY TOLERANCE: Activity tolerance: Fair  FUNCTIONAL OUTCOME MEASURES:  Measurements:   12/09/2021:   Shoulder flexion: R: 120(136), L: 138(150) Shoulder abduction: R: 108(120), L: 110(120) Elbow: R: 0-148, L: 0-146 Wrist flexion: R: 55(62), L: 78 Writ extension: R: 34(55), L: 3(4) RD: R: 12(20), L: 16(26) UD: R: 8(24), L: 6(18) Thumb radial abduction: R: 11(20), L: 8(20) Digit flexion to the [facility]: R:  2nd: 5cm(3.5cm), 3rd: 7.5 cm(5cm), 4th: 7cm(3cm), 5th: 6cm(5.5cm) L: 2nd: 8cm(8cm), 3rd: 8cm(8cm), 4th: 7.5cm(7cm), 5th: 7cm(7cm)   02/03/2022:   Shoulder flexion: R: 122(138), L: 148(154) Shoulder abduction: R: 109(125), L: 125(125 Elbow: R: 0-148, L: 0-146 Wrist flexion: R: 60(68), L: 79 Writ extension: R: 40(55), L: 3(10) RD: R: 14(24), L: 20(28) UD: R: 8(24), L: 6(18) Thumb radial abduction: R: 20(25), L: 8(20) Digit flexion to the [facility]: R:  2nd: 4.5cm(3cm), 3rd: 6.5cm(4.5cm), 4th: 6.5 cm(3cm), 5th: 4.5cm(5cm) L: 2nd: 8cm(8cm), 3rd: 8cm(7cm), 4th: 7.5cm(7cm), 5th: 6.5cm(6.5cm)   04/07/2022:   Shoulder flexion: R: 125(150), L: 160(160) Shoulder abduction: R: 109(125), L: 125(125 Elbow: R: 0-148, L: 0-146 Wrist flexion: R: 60(68), L: 79 Writ extension: R: 40(55), L: 5(18) RD: R: 14(24), L: 20(28) UD: R: 18(24), L: 8(18) Thumb radial abduction: R: 20(25), L: 8(20) Digit flexion to the [facility]: R:  2nd: 4.5 cm(4 cm), 3rd: 6.5cm(3cm), 4th: 7 cm (5cm), 5th: 5cm(5cm) L: 2nd: 8cm(8cm), 3rd: 8cm(7cm), 4th: 7.5cm(7cm), 5th: 7cm(7cm)   07/05/2022:   Shoulder flexion: R: 125(154), L: 160(160) Shoulder abduction: R: 95(130), L: 143(143) Elbow: R: 0-148, L: 0-146 Wrist flexion: R: 60(68), L: 79 Writ extension: R: 40(60), L: 0(12) RD: R: 10(24), L: 12(20) UD: R: 16(24), L: 8(16) Thumb radial abduction: R: 20(25), L: 8(20) Digit flexion to the [facility]: R:  2nd: 4 cm(4cm), 3rd: 6.5cm( 3.5cm), 4th: 7 cm (5cm),  5th: 6cm(5cm) L: 2nd: 8cm(8cm), 3rd: 8cm(7cm), 4th: 8cm(7cm), 5th: 7cm(7cm)   09/13/2022:   Shoulder flexion: R: 125(154), L: 160(160) Shoulder abduction: R: 100(130), L: 143(143) Elbow: R: 0-150, L: 0-150 Wrist flexion: R: 55(68), L: 79 Writ extension: R: 45(60), L: 0(12) RD: R: 10(24), L: 12(20) UD: R: 16(24), L: 8(16) Thumb radial abduction: R: 20(25), L: 8(20) Digit flexion to the [facility]: R:  2nd: 4 cm(4cm), 3rd: 6cm( 5cm), 4th: 6 cm (4cm), 5th: 5cm(5cm) L: 2nd: 7cm(cm), 3rd: 7cm(6cm), 4th: 7cm(6cm), 5th: 6cm(6cm)   10/27/2022   Shoulder flexion: R: 128(154), L: 160(160) Shoulder abduction: R: 105(130), L: 143(143) Elbow: R: 0-150, L: 0-150 Wrist flexion: R: 55(68), L: 79 Writ extension: R: 46(64), L: 0(20) RD: R: 10(20), L: 18(24) UD: R: 18(28), L: 8(20) Thumb radial abduction: R: 30(42), L:20(24) Digit flexion to the [facility]: R:  2nd: 4 cm(3cm), 3rd: 6cm( 6cm), 4th: 6 cm (5cm), 5th: 5cm(5cm) L: 2nd: 8cm(8cm), 3rd: 8cm(6cm), 4th: 8cm(7cm), 5th: 6cm(6cm)  01/19/2023  Shoulder flexion: R: 131(154), L: 160(160) Shoulder abduction: R: 110(130), L: 143(143) Elbow: R: 0-150, L: 0-150 Wrist flexion: R: 55(68), L: 79 Writ extension: R: 46(64), L: 0(20) RD: R: 10(20), L: 18(24) UD: R: 18(28), L:  14(20) Thumb radial abduction: R: 38(48), L:24(26) Thumb IP flexion: R: 35(50) L: 20(45) Digit flexion to the [facility]: R:  2nd: 3.5 cm(3cm), 3rd: 6cm( 6cm), 4th: 6 cm (5cm), 5th: 5cm(4.5cm) L: 2nd: 8cm(7.5cm), 3rd: 8cm(6cm), 4th: 7.5cm(6cm), 5th: 6cm(4.5cm)  04/04/2023  Shoulder flexion: R: 134(154), L: 160(160) Shoulder abduction: R: 114(130), L: 143(143) Elbow: R: WNL, L: WNL Wrist flexion: R: 55(68), L: 80 Writ extension: R: 46(64), L: -10(10) RD: R: 14(24), L: 20(24) UD: R: 20(30), L: 14(20) Thumb radial abduction: R: 38(48), L:28(30) Thumb IP flexion: R: 40(50) L: 25(45) Digit flexion to the [facility]: R:  2nd: 3.5 cm(3cm), 3rd: 6cm( 5cm), 4th: 6 cm (4cm), 5th: 5cm(5cm) L: 2nd:  8cm(7.5cm), 3rd: 8cm(7cm), 4th: 7.5cm(7cm), 5th: 7cm(6cm)  05/25/2023  Shoulder flexion: R: 134(154), L: 160(160) Shoulder abduction: R: 117(134), L: 143(143) Elbow: R: WNL, L: WNL Wrist flexion: R: 55(68), L: 80 Wrist extension: R: 46(64), L: -8(14) RD: R: 14(24), L: 20(24) UD: R: 20(32), L: 14(22) Thumb radial abduction: R: 38(50), L:28(30) Thumb IP flexion: R: 40(50) L: 30(45) Digit flexion to the [facility]: R:  2nd: 3.5 cm(3cm), 3rd: 6cm( 5cm), 4th: 7 cm (4cm), 5th: 5cm(4cm) L: 2nd: 8cm(7.5cm), 3rd: 8cm(7cm), 4th: 7.5cm(7cm), 5th: 7cm(6cm)  07/13/23  Shoulder flexion: R: 134(154), L: 160(160) Shoulder abduction: R: 122(134), L: 143(143) Elbow: R: WNL, L: WNL Wrist flexion: R: 55(68), L: 80 Wrist extension: R: 50(64), L: -4(18) RD: R: 18(24), L: 24(24) UD: R: 20(32), L: 14(22) Thumb radial abduction: R: 38(50), L:32(30) Thumb IP flexion: R: 42(50) L: 30(45) Digit flexion to the [facility]: R:  2nd: 3.5 cm(3cm), 3rd: 6cm( 5cm), 4th: 7 cm (4cm), 5th: 5cm(4cm) L: 2nd: 8cm(7.5cm), 3rd: 8cm(7cm), 4th: 7.5cm(7cm), 5th: 7cm(6cm)  11/02/23:   Shoulder flexion: R: 136(154), L: 160(160) Shoulder abduction: R: 123(134), L: 143(143) Elbow: R: WNL, L: WNL Wrist flexion: R: 55(68), L: 80 Wrist extension: R: 30(38), L: -16(18) RD: R: 8(18), L: 18(24) UD: R: 12(24), L: 8(22) Thumb radial abduction: R: 28(50), L:28(30) Thumb IP flexion: R: 42(55) L: 10(25) Digit flexion to the [facility]: R:  2nd: 4.5cm(4cm), 3rd: 6cm( 4.5cm), 4th: 6.5cm (5cm), 5th: 5cm(4.5cm) L: 2nd: 8cm(7.5cm), 3rd: 7.5cm(7cm), 4th: 7cm(7cm), 5th: 6cm(6cm)  02/06/24:  Shoulder flexion: R: 136(154), L: 160(160) Shoulder abduction: R: 123(134), L: 143(143) Elbow: R: WNL, L: WNL Wrist flexion: R: 55(68), L: 80 Wrist extension: R: 36(42), L: -10(22) RD: R: 8(18), L: 22(24) UD: R: 18(24), L: 10(22) Thumb radial abduction: R: 40 (50), L:30(32) Thumb IP flexion: R: 42(55) L: 15(25) Digit flexion to the [facility]: R:  2nd: 4.5cm(4cm), 3rd:  6cm( 4.5cm), 4th: 6.5cm (5cm), 5th: 5cm(4.5cm) L: 2nd: 7.5cm(7.5cm), 3rd: 7cm(6.5cm), 4th: 7cm(6.5cm), 5th: 5cm(5cm)       COORDINATION:   01/19/2023:  9 hole peg test:  Right: 2 pegs placed in 1 min. & 5 sec.  Pt. was able to remove 9 pegs in 17 sec.  Left: 2 pegs placed in 1 min. & 10 sec. Pt. was able to remove 9 pegs in 20 sec.  04/04/23:  Pt. was able to consistently grasp the pegs however each one slipped out of her fingers when attempting to place them into the Pegboard.  05/25/2023:   Right: 9 pegs placed and removed in 2 min. & 41 sec. Left: Unable to grasp the pegs for a horizontal position.  07/13/23    Right: 9 pegs placed and removed in 2 min. & 11 sec.     11/02/23:  Right 9 Hole Peg Test: 2 min. & 51 sec.   02/06/24:   Right 9 Hole Peg Test: 2 min. & 28 sec.with the board set-up on a laptop pillow    SENSATION: Intact  EDEMA: N/A   COGNITION: Overall cognitive status: WNL   VISION: Subjective report: Wears glasses, No changes in vision.  PERCEPTION: Intact  TODAY'S TREATMENT:                                                                                                                              DATE: 02/08/24  Therapeutic Ex.:    -Pt. tolerated PROM with slow prolonged gentle stretching to the bilateral wrist extension with increased focus on left wrist extension. AAROM/PROM for bilateral digit MP, PIP, and DIP flexion, and extension, thumb abduction was performed in conjunction with moist heat modality 2/2 increased extensor tone, and tightness in the left wrist, and bilateral digits.  -Performed BUE strengthening using a 2# dowel ex. 2/2 to weakness. Bilateral shoulder flexion, chest press, circular patterns, and elbow flexion/extension for 1 set 15 reps each.     PATIENT EDUCATION: Education details: Bilateral hand  ROM, condition management, self-care: Condition Management: BP Person educated: Patient Education method:  Explanation, Demonstration, and Verbal cues Education comprehension: verbalized understanding and returned demonstration  HOME EXERCISE PROGRAM: Continue to assess HEP needs, and provide as needed.   GOALS: Goals reviewed with patient? Yes  LONG TERM GOALS: Target date: 04/25/2024  3.  Patient will independently use a reacher to pick up items from various surfaces.  Baseline: 11/02/23: Pt. is now able to consistently pick up small objects from the floor with a reacher. 07/13/2023: Pt. is starting to use a reacher to retrieve items with an adaptation/modification to the trigger. 05/25/2023:  Pt. is now able to initiate grasping a reacher to open and close the reacher with the right hand, however requires the grasp handle to be repositioned  within the hand. Pt. requires work on using/moving the reacher with the right hand through various planes. 04/04/2023: Pt. continues to work on improving Digit flexion to the [facility] to be able to efficiently handle Adaptive equipment. 01/19/2023: Pt. Is improving with Bilateral digit flexion to the [facility]. Pt. has difficulty firmly holding adaptive devices. 12/06/2022: Pt. continues to present with increased MP, PIP, and DIP digit tightness/stiffness limiting the formulation of bilateral composites fists. 10/27/2022: Pt. presents with increased MP, PIP, and DIP digit tightness/stiffness limiting the formulation of bilateral composites fists during this progress reporting period limiting. Digit flexion to the [facility]: R:  2nd: 4 cm(3cm), 3rd: 6cm( 6cm), 4th: 6 cm (5cm), 5th: 5cm(5cm), L: 2nd: 8cm(8cm), 3rd: 8cm(6cm), 4th: 8cm(7cm), 5th: 6cm(6cm) 09/20/2022: Improving digit flexion to the [facility]. 09/13/22: improved digit flexion to the [facility]. 07/05/2022: Pt. presents with digit MP, PIP, and DIP extensor tightness limiting her ability to securely grip objects in her bilateral hands.  04/07/2022: Pt. Has improved with right 2nd, and 3rd digit flexion  towards the [facility]. Pt. Continues to have  difficulty securely holding and applying deodorant.02/03/2022: Pt. has improved with digit flexion, however, continues to have difficulty securely holding and using deodorant. 12/09/2021: Bilateral hand/digit MP, PIP, and DIP extension tightness limits her ability achieve digit flexion to hold, and apply deodorant. 10/28/2021:  Pt. continues to have difficulty holding the deodorant. 01/12/2021: Pt. presents with limited digit extension. Pt. Is able to initiate holding deodorant, however is unable to hold it while using it  Goal status:  Achieved   4.  Pt. Will improve bilateral wrist extension in preparation for anticipating, and initiating reaching for objects at the table.  Baseline: 02/06/24: Wrist extension: R: 36(42), L: -10(22) 2/31/25: Pt. Continues to present with tightness  however presents with more tightness in the left wrist. 11/30/23: Continue 11/02/23: Wrist extension: R: 30(38), L: -16(18) 07/13/23: Wrist extension: R: 50(64), L: -4(18) 05/25/23: Wrist extension: R: 46(64), L: -8(14) 04/04/23: R: 46(64) L: -10(10)01/19/2023:  R: 46(64) L: 0(20) 12/06/2022: Pt continues to to progress with bilateral wrist extension in preparation fro functional reaching. 10/27/2022: Pt. Is improving with bilateral wrist extension. R: 46(64) L: 0(20) 09/20/2022: limited PROM in the Left wrist extension 2/2 tightness/stiffness. 09/13/22: R: 55(68), L: 0(12) 07/19/2022: Bilateral wrist extension in limited. 07/05/2022: Right: 40(60) Left: 0(12) 04/07/2022: Right: 40(55) Left: 5(18) 02/03/2022: Right: 34(55) Left: 3(4) 12/09/2021: Right  34(55)  Left  3(4) 10/28/2021: Right 22(38), Left 0(15) 09/14/2021:  Right: 17(35), left 2(15)  Goal status: Ongoing  7.  Pt. Will increase bilateral lateral pinch strength by 2 lbs to be able to securely grasp items during ADLs, and IADL tasks.  Baseline: 01/19/2023: Deferred as the Pinch meter is out for calibration.12/06/2022: Pt. Is able to more securely hold objects during ADLs/IADLs  10/27/2022: Pt. Is improving with holding items with her bilateral thumbs. (Pinch meter out for calibration) 09/20/2022: lateral pinch continues to be limited 09/13/22: R 6.5# L 4# 07/19/2022: TBD 07/05/2022: TBD 07/2022: NT-Pinch meter out for calibration. 02/03/2022: Right: 6#, Left: 4# 12/09/2021: Right: 6#, Left: 4#  Goal status: Deferred   9.  Pt. will complete plant care with modified independence.  Baseline:12/06/2022: Pt. Is independent with plant care in her husband's room, however is not able to access her plants due to furniture blocking access to them. 10/27/2022: Pt. Is able to water plants that are closer, and within reach. Pt. continues to have difficulty reaching for thorough plant care. 09/20/2022: Pt. Continues to have difficulty reaching for thorough plant care. 09/13/22: continues to report intermittent difficulty 07/19/2022: Pt. continues to be able to water, and care for some of her plants. Pt. Has more difficulty with plants that are harder to reach. 07/05/2022: Pt. is able to water, and care for some of her plants. Pt. Has more difficulty with plants that are harder to reach. 04/07/2022: Pt. is now able to hold a cup and water her plants.02/03/2022: Pt. has difficulty caring for her plants.  Goal status:Achieved   10.  Pt. will demonstrate adaptive techniques to assist with the efficiency of self-dressing, or morning care tasks.  Baseline: 07/13/23:Pt. continues to work on improving UE functioning to be able to efficiently use Adaptive equipment 05/25/2023: Pt. continues to work on improving UE functioning to be able to efficiently use Adaptive equipment.  04/04/2023:  Pt. continues to work on improving UE functioning to be able to efficiently use Adaptive equipment. 01/19/2203: Pt. Is able to donn her jacket in reverse independently. Pt. requires assist from staff, as Pt. Reports  staff have decreased time. 12/06/2022: Pt. requires assist from staff, as Pt. Reports staff have decreased time.  10/27/2022: Pt. is able to assist with initiating UE dressing. Pt. requires assist from personal, and staff care aides. 09/20/2022: Pt. continues to require assist form personal, and staff care aides. 09/13/22: MODA dressing, reports she is often rushed 07/19/2022: Pt. continues to require assist with self-dressing/morning care tasks. 07/05/2022: Continue with goal. 04/07/2022: Pt. Continues to require assist with the efficiency of self-dressing, and morning care tasks.02/03/2022: Pt. requires assist from caregivers 2/2 time limitations during morning care.  Goal status:  Deferred- Has caregiver assist and morning routine in place.  11.  Pt. will improve BUE strength to improve ADL, and IADL functioning.  Baseline:02/06/24: flexion L 4-/5, R 4-/5; shoulder abduction L 4-/5, R 4-/5. elbow flexion B 4/5. 02/01/24: Bilateral UE strength continues to be limited 11/30/2023: Continue 11/02/23: flexion L 4-/5, R 4-/5; shoulder abduction L 4-/5, R 4-/5. elbow flexion B 4/5,01/19/2023: L 4/5, R 3+/5  07/13/23: flexion L 4/5, R 4-/5; shoulder abduction L 4+/5, R 4-/5. elbow flexion B 5/5, elbow extension 4+/5 01/19/2023: L 4/5, R 3+/5  05/25/2023: shoulder flexion L 4/5, R 4-/5; shoulder abduction L 4+/5, R 4-/5. elbow flexion B 5/5, elbow extension 4+/5 01/19/2023: L 4/5, R 3+/5  04/04/2023: shoulder flexion L 4/5, R 3+/5; shoulder abduction L 4+/5, R 3+/5. elbow flexion B 5/5, elbow extension 4/5 01/19/2023: L 4/5, R 3+/5 12/06/2022: Continue 10/27/2022: shoulder flexion L 4/5, R 3+/5; shoulder abduction L 4+/5, R 3+/5. elbow flexion B 5/5, elbow extension 4/5 09/20/2022: Pt. Presents with limited BUE strength 09/13/22: shoulder flexion L 4/5, R 3/5; elbow flexion B 5/5, elbow extension 4-/5; shoulder abduction L 4+/5, R 3+/5. 07/19/2022: BUE strength continues to be limited. 07/05/2022: shoulder flexion: right 4-/5, abduction: 3+/5, elbow flexion: right: 5/5, left 5/5, extension: right: 4-/5, left 4-/5, wrist extension: right: 3-/5,  left: 2-/5  Goal status: Ongoing  12.  Pt. will improve bilateral thumb radial abduction in order to be able to hold the grab bars while standing with PT  Baseline:02/06/24:Thumb radial abduction: R: 40 (50), L:30(32) Pt. is improving with thumb radial abduction during grasping, however has ankle cast in place therefore is unable to work on grasping parallel bars in standing.02/01/24: Pt. continues to improve with attempting more thumb abduction needed to reach, and grasp items.11/30/2023: Continue 11/02/23: Thumb radial abduction: R: 28(50), L:28(30) 07/13/23: Pt. Is improving with holding the grab bars with the right hand 05/25/23: Thumb radial abduction: R: 38(50), L:28(30) 04/04/23: R: 38(48), L:28(30) 01/19/2023: R: 38(48) L: 24(26)12/06/2022: Pt. Continues to present  with limited thumb abduction, however is improving holding onto the parallel bars.10/27/2022: thumb radial abduction: R: 30(42), L: 20(24  Goal status: Ongoing  13.  Pt. will be able to securely hold, and stabilize medication bottles at a tabletop surface when opening, and closing them.  Baseline: 01/19/2023: Pt. is now able to securely hold medication bottles while opening them, and stabilizes them on surfaces while opening them. 12/06/2022: Pt is improving with securely stabilizing medication bottles. 10/27/2022: Pt. Stabilizes bottles against her torso when attempting to open, and close them   Goal status: Achieved  14: Pt. Will improve bilateral thumb IP flexion to improve active grasping patterns.    Baseline: 02/06/23: Thumb IP flexion: R: 42(55) L: 15(25)  02/01/24: pt. Presents with tightness with bilateral thumb IP flexion.11/30/2023: Continue 11/02/23: Thumb IP flexion: R: 42(55) L: 10(25) 07/13/23: Thumb IP flexion: R: 42(50) L: 30(45) 05/25/2023:  Thumb IP flexion: R: 40(50) L: 30(45) 04/04/2023:  R: 40(50) L: 25(45) 01/19/2023: Right 35(50), Left: 20/45)    Goal status: Ongoing    15. Pt. Will improve bilateral FMC/speed, and  dexterity. As evidence by improved scores on the 9 hole peg test. Baseline: 02/06/24: Right 9 Hole Peg Test: 2 min. & 28 sec.with the board set-up on a laptop pillow 02/01/24: TBD 11/30/2023: Limited bilateral fine motor coordination;continue 11/02/23: 2 min. & 51 sec. 07/13/2023: Right: 9 pegs placed and removed in 2 min. & 11 sec. 05/25/2023: 9 Hole pegs test speed/dexterity: Right: 9 pegs placed and removed in 2 min. & 41 sec. Left: Unable to grasp the pegs for a horizontal position. 04/04/23: Pt. was able to consistently grasp the pegs however each one slipped out of her fingers when attempting to place them into the Pegboard. 01/19/2023: 2 pegs placed in 1 min. & 5 sec.  Pt. was able to remove 9 pegs in 17 sec.; Left: 2 pegs placed in 1 min. & 10 sec. Pt. was able to remove 9 pegs in 20 sec.   Goal status: Ongoing    16. Pt. Will donn/doff a jacket with modified independence   Baseline: 02/01/24: Independent-Pt. Wears her coat in reverse direction. 11/30/2023: Continue 11/02/23: MaxA   Goal status: Achieved.     ASSESSMENT:  CLINICAL IMPRESSION:  Upon arrival Pt. reports that her BP has been good this morning while at [facility]. Pt.'s BP 106/58 with HR 69 bpms. Pt. tolerated ROM well, however continues to present with increased extensor tightness in her wrist, and digits with increased tightness in the left. Pt. required cues, however no physical assist, or support required proximally when performing the task. Pt. continues to benefit from OT services to work on impoving ROM, and UE strength in order to work towards increasing bilateral hand grasp on objects, and increasing engagement of bilateral hands during ADLs, and IADL tasks    PERFORMANCE DE PERFORMANCE DEFICITS: in functional skills including ADLs, IADLs, coordination, dexterity, ROM, strength, and UE functional use, cognitive skills including , and psychosocial skills including coping strategies and environmental adaptation.    IMPAIRMENTS: are limiting patient from ADLs, IADLs, and leisure.   CO-MORBIDITIES: may have co-morbidities  that affects occupational performance. Patient will benefit from skilled OT to address above impairments and improve overall function.  MODIFICATION OR ASSISTANCE TO COMPLETE EVALUATION: Min-Moderate modification of tasks or assist with assess necessary to complete an evaluation.  OT OCCUPATIONAL PROFILE AND HISTORY: Detailed assessment: Review of records and additional review of physical, cognitive, psychosocial history related to current functional performance.  CLINICAL DECISION MAKING: Moderate - several treatment options, min-mod task modification necessary  REHAB POTENTIAL: Good for stated goals  PLAN:  OT FREQUENCY 2x's a week  OT DURATION: 12 weeks  PLANNED INTERVENTIONS ADL training, A/E training, UE ther. Ex, Manual therapy, neuromuscular re-education, moist heat modality, Paraffin Bath, Splinting, and  Pt./caregiver education   RECOMMENDED OTHER SERVICES: PT  CONSULTED AND AGREED WITH PLAN OF CARE: Patient  PLAN FOR NEXT SESSION: Continue Treatment as per established POC  Richardson Otter, MS, OTR/L  02/08/2024, 2:21 PM       "

## 2024-02-13 ENCOUNTER — Ambulatory Visit: Admitting: Occupational Therapy

## 2024-02-13 ENCOUNTER — Ambulatory Visit

## 2024-02-13 DIAGNOSIS — M6281 Muscle weakness (generalized): Secondary | ICD-10-CM

## 2024-02-13 NOTE — Therapy (Signed)
 " Occupational Therapy Neuro Treatment Note Patient Name: Sherry [name] MRN: [id number] DOB:Jun 14, 1934, 89 y.o., female Today's Date: 11/29/2022  PCP:  [facility] PROVIDER: Abdul Fine, MD  END OF SESSION:   OT End of Session - 02/13/24 1304     Visit Number 152    Number of Visits 216    Date for Recertification  04/25/24    OT Start Time 1100    OT Stop Time 1145    OT Time Calculation (min) 45 min    Activity Tolerance Patient tolerated treatment well    Behavior During Therapy [facility] for tasks assessed/performed              Past Medical History:  Diagnosis Date   Acute blood loss anemia    Arthritis    Cancer (HCC)    skin   Central cord syndrome at C6 level of cervical spinal cord (HCC) 11/29/2017   Hypertension    Protein-calorie malnutrition, severe (HCC) 01/24/2018   S/P insertion of IVC (inferior vena caval) filter 01/24/2018   Tetraparesis (HCC)    Past Surgical History:  Procedure Laterality Date   ANTERIOR CERVICAL DECOMP/DISCECTOMY FUSION N/A 11/29/2017   Procedure: Cervical five-six, six-seven Anterior Cervical Decompression Fusion;  Surgeon: Cheryle Debby DELENA, MD;  Location: [facility] OR;  Service: Neurosurgery;  Laterality: N/A;  Cervical five-six, six-seven Anterior Cervical Decompression Fusion   CATARACT EXTRACTION     FEMUR IM NAIL Left 08/16/2021   Procedure: INTRAMEDULLARY (IM) NAIL FEMORAL;  Surgeon: Cleotilde Barrio, MD;  Location: [facility] ORS;  Service: Orthopedics;  Laterality: Left;   IR IVC FILTER PLMT / S&I /IMG GUID/MOD SED  12/08/2017   Patient Active Problem List   Diagnosis Date Noted   Age-related osteoporosis without current pathological fracture 07/27/2022   Mild recurrent major depression (HCC) 07/27/2022   Hypertensive heart disease with other congestive heart failure (HCC) 02/14/2022   Chronic indwelling Foley catheter 02/14/2022   Closed hip fracture (HCC) 08/15/2021   DVT (deep venous thrombosis) (HCC) 08/15/2021   Quadriplegia  (HCC) 08/15/2021   Fall 08/15/2021   Constipation due to slow transit 08/31/2018   Trauma 06/05/2018   Neuropathic pain 06/05/2018   Neurogenic bowel 06/05/2018   Vaginal yeast infection 01/30/2018   Healthcare-associated pneumonia 01/25/2018   Chronic allergic rhinitis 01/24/2018   Depression with anxiety 01/24/2018   UTI due to Klebsiella species 01/24/2018   Protein-calorie malnutrition, severe (HCC) 01/24/2018   S/P insertion of IVC (inferior vena caval) filter 01/24/2018   Tetraparesis (HCC) 01/20/2018   Neurogenic bladder 01/20/2018   Reactive depression    Benign essential HTN    Acute postoperative anemia due to expected blood loss    Central cord syndrome at C6 level of cervical spinal cord (HCC) 11/29/2017   Allergy to alpha-gal 11/25/2016   SCC (squamous cell carcinoma) 04/08/2014   ONSET DATE: 11/29/2017  REFERRING DIAG: Central Cord Syndrome at C6 of the Cervical Spinal Cord, Fall with Bilateral Closed Hip Fractures, with ORIF repair with Intramedullary Nailing of the Left Hip Fracture.   THERAPY DIAG:  Muscle weakness (generalized)  Other lack of coordination  Rationale for Evaluation and Treatment: Rehabilitation  SUBJECTIVE:  SUBJECTIVE STATEMENT:  Patient report that her BP was good this morning at [facility].  PERTINENT HISTORY: Central Cord Syndrome at C6 of the Cervical Spinal Cord, Fall with Bilateral Closed Hip Fractures, with ORIF repair with Intramedullary Nailing of the Left Hip Fracture.   PRECAUTIONS: None  WEIGHT BEARING RESTRICTIONS: No  PAIN:  Are you having pain? No pain   LIVING ENVIRONMENT: Lives with: [facility] Stairs: No Has following equipment at home: Wheelchair (power)  PLOF: Independent   PATIENT GOALS:  See below for established goals  OBJECTIVE:  Note: Objective measures were completed at Evaluation unless otherwise noted.  HAND DOMINANCE: Right  ADLs: Caregiver assist with ADLs, and [place]  [facility]  MOBILITY STATUS: Uses a power w/c  ACTIVITY TOLERANCE: Activity tolerance: Fair  FUNCTIONAL OUTCOME MEASURES:  Measurements:   12/09/2021:   Shoulder flexion: R: 120(136), L: 138(150) Shoulder abduction: R: 108(120), L: 110(120) Elbow: R: 0-148, L: 0-146 Wrist flexion: R: 55(62), L: 78 Writ extension: R: 34(55), L: 3(4) RD: R: 12(20), L: 16(26) UD: R: 8(24), L: 6(18) Thumb radial abduction: R: 11(20), L: 8(20) Digit flexion to the [facility]: R:  2nd: 5cm(3.5cm), 3rd: 7.5 cm(5cm), 4th: 7cm(3cm), 5th: 6cm(5.5cm) L: 2nd: 8cm(8cm), 3rd: 8cm(8cm), 4th: 7.5cm(7cm), 5th: 7cm(7cm)   02/03/2022:   Shoulder flexion: R: 122(138), L: 148(154) Shoulder abduction: R: 109(125), L: 125(125 Elbow: R: 0-148, L: 0-146 Wrist flexion: R: 60(68), L: 79 Writ extension: R: 40(55), L: 3(10) RD: R: 14(24), L: 20(28) UD: R: 8(24), L: 6(18) Thumb radial abduction: R: 20(25), L: 8(20) Digit flexion to the [facility]: R:  2nd: 4.5cm(3cm), 3rd: 6.5cm(4.5cm), 4th: 6.5 cm(3cm), 5th: 4.5cm(5cm) L: 2nd: 8cm(8cm), 3rd: 8cm(7cm), 4th: 7.5cm(7cm), 5th: 6.5cm(6.5cm)   04/07/2022:   Shoulder flexion: R: 125(150), L: 160(160) Shoulder abduction: R: 109(125), L: 125(125 Elbow: R: 0-148, L: 0-146 Wrist flexion: R: 60(68), L: 79 Writ extension: R: 40(55), L: 5(18) RD: R: 14(24), L: 20(28) UD: R: 18(24), L: 8(18) Thumb radial abduction: R: 20(25), L: 8(20) Digit flexion to the [facility]: R:  2nd: 4.5 cm(4 cm), 3rd: 6.5cm(3cm), 4th: 7 cm (5cm), 5th: 5cm(5cm) L: 2nd: 8cm(8cm), 3rd: 8cm(7cm), 4th: 7.5cm(7cm), 5th: 7cm(7cm)   07/05/2022:   Shoulder flexion: R: 125(154), L: 160(160) Shoulder abduction: R: 95(130), L: 143(143) Elbow: R: 0-148, L: 0-146 Wrist flexion: R: 60(68), L: 79 Writ extension: R: 40(60), L: 0(12) RD: R: 10(24), L: 12(20) UD: R: 16(24), L: 8(16) Thumb radial abduction: R: 20(25), L: 8(20) Digit flexion to the [facility]: R:  2nd: 4 cm(4cm), 3rd: 6.5cm( 3.5cm), 4th: 7 cm (5cm), 5th: 6cm(5cm) L: 2nd:  8cm(8cm), 3rd: 8cm(7cm), 4th: 8cm(7cm), 5th: 7cm(7cm)   09/13/2022:   Shoulder flexion: R: 125(154), L: 160(160) Shoulder abduction: R: 100(130), L: 143(143) Elbow: R: 0-150, L: 0-150 Wrist flexion: R: 55(68), L: 79 Writ extension: R: 45(60), L: 0(12) RD: R: 10(24), L: 12(20) UD: R: 16(24), L: 8(16) Thumb radial abduction: R: 20(25), L: 8(20) Digit flexion to the [facility]: R:  2nd: 4 cm(4cm), 3rd: 6cm( 5cm), 4th: 6 cm (4cm), 5th: 5cm(5cm) L: 2nd: 7cm(cm), 3rd: 7cm(6cm), 4th: 7cm(6cm), 5th: 6cm(6cm)   10/27/2022   Shoulder flexion: R: 128(154), L: 160(160) Shoulder abduction: R: 105(130), L: 143(143) Elbow: R: 0-150, L: 0-150 Wrist flexion: R: 55(68), L: 79 Writ extension: R: 46(64), L: 0(20) RD: R: 10(20), L: 18(24) UD: R: 18(28), L: 8(20) Thumb radial abduction: R: 30(42), L:20(24) Digit flexion to the [facility]: R:  2nd: 4 cm(3cm), 3rd: 6cm( 6cm), 4th: 6 cm (5cm), 5th: 5cm(5cm) L: 2nd: 8cm(8cm), 3rd: 8cm(6cm), 4th: 8cm(7cm), 5th: 6cm(6cm)  01/19/2023  Shoulder flexion: R: 131(154), L: 160(160) Shoulder abduction: R: 110(130), L: 143(143) Elbow: R: 0-150, L: 0-150 Wrist flexion: R: 55(68), L: 79 Writ extension: R: 46(64), L: 0(20) RD: R: 10(20), L: 18(24) UD: R: 18(28), L: 14(20) Thumb radial abduction: R:  38(48), L:24(26) Thumb IP flexion: R: 35(50) L: 20(45) Digit flexion to the [facility]: R:  2nd: 3.5 cm(3cm), 3rd: 6cm( 6cm), 4th: 6 cm (5cm), 5th: 5cm(4.5cm) L: 2nd: 8cm(7.5cm), 3rd: 8cm(6cm), 4th: 7.5cm(6cm), 5th: 6cm(4.5cm)  04/04/2023  Shoulder flexion: R: 134(154), L: 160(160) Shoulder abduction: R: 114(130), L: 143(143) Elbow: R: WNL, L: WNL Wrist flexion: R: 55(68), L: 80 Writ extension: R: 46(64), L: -10(10) RD: R: 14(24), L: 20(24) UD: R: 20(30), L: 14(20) Thumb radial abduction: R: 38(48), L:28(30) Thumb IP flexion: R: 40(50) L: 25(45) Digit flexion to the [facility]: R:  2nd: 3.5 cm(3cm), 3rd: 6cm( 5cm), 4th: 6 cm (4cm), 5th: 5cm(5cm) L: 2nd: 8cm(7.5cm), 3rd: 8cm(7cm),  4th: 7.5cm(7cm), 5th: 7cm(6cm)  05/25/2023  Shoulder flexion: R: 134(154), L: 160(160) Shoulder abduction: R: 117(134), L: 143(143) Elbow: R: WNL, L: WNL Wrist flexion: R: 55(68), L: 80 Wrist extension: R: 46(64), L: -8(14) RD: R: 14(24), L: 20(24) UD: R: 20(32), L: 14(22) Thumb radial abduction: R: 38(50), L:28(30) Thumb IP flexion: R: 40(50) L: 30(45) Digit flexion to the [facility]: R:  2nd: 3.5 cm(3cm), 3rd: 6cm( 5cm), 4th: 7 cm (4cm), 5th: 5cm(4cm) L: 2nd: 8cm(7.5cm), 3rd: 8cm(7cm), 4th: 7.5cm(7cm), 5th: 7cm(6cm)  07/13/23  Shoulder flexion: R: 134(154), L: 160(160) Shoulder abduction: R: 122(134), L: 143(143) Elbow: R: WNL, L: WNL Wrist flexion: R: 55(68), L: 80 Wrist extension: R: 50(64), L: -4(18) RD: R: 18(24), L: 24(24) UD: R: 20(32), L: 14(22) Thumb radial abduction: R: 38(50), L:32(30) Thumb IP flexion: R: 42(50) L: 30(45) Digit flexion to the [facility]: R:  2nd: 3.5 cm(3cm), 3rd: 6cm( 5cm), 4th: 7 cm (4cm), 5th: 5cm(4cm) L: 2nd: 8cm(7.5cm), 3rd: 8cm(7cm), 4th: 7.5cm(7cm), 5th: 7cm(6cm)  11/02/23:   Shoulder flexion: R: 136(154), L: 160(160) Shoulder abduction: R: 123(134), L: 143(143) Elbow: R: WNL, L: WNL Wrist flexion: R: 55(68), L: 80 Wrist extension: R: 30(38), L: -16(18) RD: R: 8(18), L: 18(24) UD: R: 12(24), L: 8(22) Thumb radial abduction: R: 28(50), L:28(30) Thumb IP flexion: R: 42(55) L: 10(25) Digit flexion to the [facility]: R:  2nd: 4.5cm(4cm), 3rd: 6cm( 4.5cm), 4th: 6.5cm (5cm), 5th: 5cm(4.5cm) L: 2nd: 8cm(7.5cm), 3rd: 7.5cm(7cm), 4th: 7cm(7cm), 5th: 6cm(6cm)  02/06/24:  Shoulder flexion: R: 136(154), L: 160(160) Shoulder abduction: R: 123(134), L: 143(143) Elbow: R: WNL, L: WNL Wrist flexion: R: 55(68), L: 80 Wrist extension: R: 36(42), L: -10(22) RD: R: 8(18), L: 22(24) UD: R: 18(24), L: 10(22) Thumb radial abduction: R: 40 (50), L:30(32) Thumb IP flexion: R: 42(55) L: 15(25) Digit flexion to the [facility]: R:  2nd: 4.5cm(4cm), 3rd: 6cm( 4.5cm), 4th: 6.5cm  (5cm), 5th: 5cm(4.5cm) L: 2nd: 7.5cm(7.5cm), 3rd: 7cm(6.5cm), 4th: 7cm(6.5cm), 5th: 5cm(5cm)       COORDINATION:   01/19/2023:  9 hole peg test:  Right: 2 pegs placed in 1 min. & 5 sec.  Pt. was able to remove 9 pegs in 17 sec.  Left: 2 pegs placed in 1 min. & 10 sec. Pt. was able to remove 9 pegs in 20 sec.  04/04/23:  Pt. was able to consistently grasp the pegs however each one slipped out of her fingers when attempting to place them into the Pegboard.  05/25/2023:   Right: 9 pegs placed and removed in 2 min. & 41 sec. Left: Unable to grasp the pegs for a horizontal position.  07/13/23    Right: 9 pegs placed and removed in 2 min. & 11 sec.     11/02/23:  Right 9 Hole Peg Test: 2 min. & 51 sec.   02/06/24:   Right 9 Hole Peg Test: 2 min. & 28 sec.with the board set-up on a laptop pillow    SENSATION: Intact  EDEMA: N/A   COGNITION: Overall cognitive status: WNL   VISION: Subjective report: Wears glasses, No changes in vision.  PERCEPTION: Intact  TODAY'S TREATMENT:                                                                                                                              DATE: 02/13/24  Therapeutic Ex.:    -Pt. tolerated PROM with slow prolonged gentle stretching to the bilateral wrist extension with increased focus on left wrist extension. AAROM/PROM for bilateral digit MP, PIP, and DIP flexion, and extension, thumb abduction was performed in conjunction with moist heat modality 2/2 increased extensor tone, and tightness in the left wrist, and bilateral digits.  -Performed BUE strengthening using a 3# dowel ex. 2/2 to weakness. Bilateral shoulder flexion, chest press, circular patterns, and elbow flexion/extension for 2 sets- 1st set 10 reps, 2nd set 5-10 reps each.     PATIENT EDUCATION: Education details: Bilateral hand  ROM, condition management, self-care: Condition Management: BP Person educated: Patient Education  method: Explanation, Demonstration, and Verbal cues Education comprehension: verbalized understanding and returned demonstration  HOME EXERCISE PROGRAM: Continue to assess HEP needs, and provide as needed.   GOALS: Goals reviewed with patient? Yes  LONG TERM GOALS: Target date: 04/25/2024  3.  Patient will independently use a reacher to pick up items from various surfaces.  Baseline: 11/02/23: Pt. is now able to consistently pick up small objects from the floor with a reacher. 07/13/2023: Pt. is starting to use a reacher to retrieve items with an adaptation/modification to the trigger. 05/25/2023:  Pt. is now able to initiate grasping a reacher to open and close the reacher with the right hand, however requires the grasp handle to be repositioned  within the hand. Pt. requires work on using/moving the reacher with the right hand through various planes. 04/04/2023: Pt. continues to work on improving Digit flexion to the [facility] to be able to efficiently handle Adaptive equipment. 01/19/2023: Pt. Is improving with Bilateral digit flexion to the [facility]. Pt. has difficulty firmly holding adaptive devices. 12/06/2022: Pt. continues to present with increased MP, PIP, and DIP digit tightness/stiffness limiting the formulation of bilateral composites fists. 10/27/2022: Pt. presents with increased MP, PIP, and DIP digit tightness/stiffness limiting the formulation of bilateral composites fists during this progress reporting period limiting. Digit flexion to the [facility]: R:  2nd: 4 cm(3cm), 3rd: 6cm( 6cm), 4th: 6 cm (5cm), 5th: 5cm(5cm), L: 2nd: 8cm(8cm), 3rd: 8cm(6cm), 4th: 8cm(7cm), 5th: 6cm(6cm) 09/20/2022: Improving digit flexion to the [facility]. 09/13/22: improved digit flexion to the [facility]. 07/05/2022: Pt. presents with digit MP, PIP, and DIP extensor tightness limiting her ability to securely grip objects in her bilateral hands.  04/07/2022: Pt. Has improved with  right 2nd, and 3rd digit flexion towards the [facility]. Pt. Continues to have  difficulty securely holding and applying deodorant.02/03/2022: Pt. has improved with digit flexion, however, continues to have difficulty securely holding and using deodorant. 12/09/2021: Bilateral hand/digit MP, PIP, and DIP extension tightness limits her ability achieve digit flexion to hold, and apply deodorant. 10/28/2021:  Pt. continues to have difficulty holding the deodorant. 01/12/2021: Pt. presents with limited digit extension. Pt. Is able to initiate holding deodorant, however is unable to hold it while using it  Goal status:  Achieved   4.  Pt. Will improve bilateral wrist extension in preparation for anticipating, and initiating reaching for objects at the table.  Baseline: 02/06/24: Wrist extension: R: 36(42), L: -10(22) 2/31/25: Pt. Continues to present with tightness  however presents with more tightness in the left wrist. 11/30/23: Continue 11/02/23: Wrist extension: R: 30(38), L: -16(18) 07/13/23: Wrist extension: R: 50(64), L: -4(18) 05/25/23: Wrist extension: R: 46(64), L: -8(14) 04/04/23: R: 46(64) L: -10(10)01/19/2023:  R: 46(64) L: 0(20) 12/06/2022: Pt continues to to progress with bilateral wrist extension in preparation fro functional reaching. 10/27/2022: Pt. Is improving with bilateral wrist extension. R: 46(64) L: 0(20) 09/20/2022: limited PROM in the Left wrist extension 2/2 tightness/stiffness. 09/13/22: R: 55(68), L: 0(12) 07/19/2022: Bilateral wrist extension in limited. 07/05/2022: Right: 40(60) Left: 0(12) 04/07/2022: Right: 40(55) Left: 5(18) 02/03/2022: Right: 34(55) Left: 3(4) 12/09/2021: Right  34(55)  Left  3(4) 10/28/2021: Right 22(38), Left 0(15) 09/14/2021:  Right: 17(35), left 2(15)  Goal status: Ongoing  7.  Pt. Will increase bilateral lateral pinch strength by 2 lbs to be able to securely grasp items during ADLs, and IADL tasks.  Baseline: 01/19/2023: Deferred as the Pinch meter is out for calibration.12/06/2022: Pt. Is able to more securely hold objects during ADLs/IADLs  10/27/2022: Pt. Is improving with holding items with her bilateral thumbs. (Pinch meter out for calibration) 09/20/2022: lateral pinch continues to be limited 09/13/22: R 6.5# L 4# 07/19/2022: TBD 07/05/2022: TBD 07/2022: NT-Pinch meter out for calibration. 02/03/2022: Right: 6#, Left: 4# 12/09/2021: Right: 6#, Left: 4#  Goal status: Deferred   9.  Pt. will complete plant care with modified independence.  Baseline:12/06/2022: Pt. Is independent with plant care in her husband's room, however is not able to access her plants due to furniture blocking access to them. 10/27/2022: Pt. Is able to water plants that are closer, and within reach. Pt. continues to have difficulty reaching for thorough plant care. 09/20/2022: Pt. Continues to have difficulty reaching for thorough plant care. 09/13/22: continues to report intermittent difficulty 07/19/2022: Pt. continues to be able to water, and care for some of her plants. Pt. Has more difficulty with plants that are harder to reach. 07/05/2022: Pt. is able to water, and care for some of her plants. Pt. Has more difficulty with plants that are harder to reach. 04/07/2022: Pt. is now able to hold a cup and water her plants.02/03/2022: Pt. has difficulty caring for her plants.  Goal status:Achieved   10.  Pt. will demonstrate adaptive techniques to assist with the efficiency of self-dressing, or morning care tasks.  Baseline: 07/13/23:Pt. continues to work on improving UE functioning to be able to efficiently use Adaptive equipment 05/25/2023: Pt. continues to work on improving UE functioning to be able to efficiently use Adaptive equipment.  04/04/2023:  Pt. continues to work on improving UE functioning to be able to efficiently use Adaptive equipment. 01/19/2203: Pt. Is able to donn her jacket in reverse independently. Pt. requires  assist from staff, as Pt. Reports staff have decreased time. 12/06/2022: Pt. requires assist from staff, as Pt. Reports staff have decreased time.  10/27/2022: Pt. is able to assist with initiating UE dressing. Pt. requires assist from personal, and staff care aides. 09/20/2022: Pt. continues to require assist form personal, and staff care aides. 09/13/22: MODA dressing, reports she is often rushed 07/19/2022: Pt. continues to require assist with self-dressing/morning care tasks. 07/05/2022: Continue with goal. 04/07/2022: Pt. Continues to require assist with the efficiency of self-dressing, and morning care tasks.02/03/2022: Pt. requires assist from caregivers 2/2 time limitations during morning care.  Goal status:  Deferred- Has caregiver assist and morning routine in place.  11.  Pt. will improve BUE strength to improve ADL, and IADL functioning.  Baseline:02/06/24: flexion L 4-/5, R 4-/5; shoulder abduction L 4-/5, R 4-/5. elbow flexion B 4/5. 02/01/24: Bilateral UE strength continues to be limited 11/30/2023: Continue 11/02/23: flexion L 4-/5, R 4-/5; shoulder abduction L 4-/5, R 4-/5. elbow flexion B 4/5,01/19/2023: L 4/5, R 3+/5  07/13/23: flexion L 4/5, R 4-/5; shoulder abduction L 4+/5, R 4-/5. elbow flexion B 5/5, elbow extension 4+/5 01/19/2023: L 4/5, R 3+/5  05/25/2023: shoulder flexion L 4/5, R 4-/5; shoulder abduction L 4+/5, R 4-/5. elbow flexion B 5/5, elbow extension 4+/5 01/19/2023: L 4/5, R 3+/5  04/04/2023: shoulder flexion L 4/5, R 3+/5; shoulder abduction L 4+/5, R 3+/5. elbow flexion B 5/5, elbow extension 4/5 01/19/2023: L 4/5, R 3+/5 12/06/2022: Continue 10/27/2022: shoulder flexion L 4/5, R 3+/5; shoulder abduction L 4+/5, R 3+/5. elbow flexion B 5/5, elbow extension 4/5 09/20/2022: Pt. Presents with limited BUE strength 09/13/22: shoulder flexion L 4/5, R 3/5; elbow flexion B 5/5, elbow extension 4-/5; shoulder abduction L 4+/5, R 3+/5. 07/19/2022: BUE strength continues to be limited. 07/05/2022: shoulder flexion: right 4-/5, abduction: 3+/5, elbow flexion: right: 5/5, left 5/5, extension: right: 4-/5, left 4-/5, wrist extension: right: 3-/5,  left: 2-/5  Goal status: Ongoing  12.  Pt. will improve bilateral thumb radial abduction in order to be able to hold the grab bars while standing with PT  Baseline:02/06/24:Thumb radial abduction: R: 40 (50), L:30(32) Pt. is improving with thumb radial abduction during grasping, however has ankle cast in place therefore is unable to work on grasping parallel bars in standing.02/01/24: Pt. continues to improve with attempting more thumb abduction needed to reach, and grasp items.11/30/2023: Continue 11/02/23: Thumb radial abduction: R: 28(50), L:28(30) 07/13/23: Pt. Is improving with holding the grab bars with the right hand 05/25/23: Thumb radial abduction: R: 38(50), L:28(30) 04/04/23: R: 38(48), L:28(30) 01/19/2023: R: 38(48) L: 24(26)12/06/2022: Pt. Continues to present  with limited thumb abduction, however is improving holding onto the parallel bars.10/27/2022: thumb radial abduction: R: 30(42), L: 20(24  Goal status: Ongoing  13.  Pt. will be able to securely hold, and stabilize medication bottles at a tabletop surface when opening, and closing them.  Baseline: 01/19/2023: Pt. is now able to securely hold medication bottles while opening them, and stabilizes them on surfaces while opening them. 12/06/2022: Pt is improving with securely stabilizing medication bottles. 10/27/2022: Pt. Stabilizes bottles against her torso when attempting to open, and close them   Goal status: Achieved  14: Pt. Will improve bilateral thumb IP flexion to improve active grasping patterns.    Baseline: 02/06/23: Thumb IP flexion: R: 42(55) L: 15(25)  02/01/24: pt. Presents with tightness with bilateral thumb IP flexion.11/30/2023: Continue 11/02/23: Thumb IP flexion: R: 42(55) L: 10(25) 07/13/23: Thumb IP  flexion: R: 42(50) L: 30(45) 05/25/2023: Thumb IP flexion: R: 40(50) L: 30(45) 04/04/2023:  R: 40(50) L: 25(45) 01/19/2023: Right 35(50), Left: 20/45)    Goal status: Ongoing    15. Pt. Will improve bilateral FMC/speed, and  dexterity. As evidence by improved scores on the 9 hole peg test. Baseline: 02/06/24: Right 9 Hole Peg Test: 2 min. & 28 sec.with the board set-up on a laptop pillow 02/01/24: TBD 11/30/2023: Limited bilateral fine motor coordination;continue 11/02/23: 2 min. & 51 sec. 07/13/2023: Right: 9 pegs placed and removed in 2 min. & 11 sec. 05/25/2023: 9 Hole pegs test speed/dexterity: Right: 9 pegs placed and removed in 2 min. & 41 sec. Left: Unable to grasp the pegs for a horizontal position. 04/04/23: Pt. was able to consistently grasp the pegs however each one slipped out of her fingers when attempting to place them into the Pegboard. 01/19/2023: 2 pegs placed in 1 min. & 5 sec.  Pt. was able to remove 9 pegs in 17 sec.; Left: 2 pegs placed in 1 min. & 10 sec. Pt. was able to remove 9 pegs in 20 sec.   Goal status: Ongoing    16. Pt. Will donn/doff a jacket with modified independence   Baseline: 02/01/24: Independent-Pt. Wears her coat in reverse direction. 11/30/2023: Continue 11/02/23: MaxA   Goal status: Achieved.     ASSESSMENT:  CLINICAL IMPRESSION:  Upon arrival, Pt.'s BP 111/66 with HR 72 bpms. Pt. tolerated ROM well, however continues to present with increased extensor tightness in her wrist, and digits with increased tightness in the left. Pt. was able to tolerate increased resistance using the 3# dowel, however required the 2nd set of reps reduced to 5-10 reps each. Pt. continues to benefit from OT services to work on impoving ROM, and UE strength in order to work towards increasing bilateral hand grasp on objects, and increasing engagement of bilateral hands during ADLs, and IADL tasks    PERFORMANCE DE PERFORMANCE DEFICITS: in functional skills including ADLs, IADLs, coordination, dexterity, ROM, strength, and UE functional use, cognitive skills including , and psychosocial skills including coping strategies and environmental adaptation.   IMPAIRMENTS: are limiting patient from ADLs, IADLs, and  leisure.   CO-MORBIDITIES: may have co-morbidities  that affects occupational performance. Patient will benefit from skilled OT to address above impairments and improve overall function.  MODIFICATION OR ASSISTANCE TO COMPLETE EVALUATION: Min-Moderate modification of tasks or assist with assess necessary to complete an evaluation.  OT OCCUPATIONAL PROFILE AND HISTORY: Detailed assessment: Review of records and additional review of physical, cognitive, psychosocial history related to current functional performance.  CLINICAL DECISION MAKING: Moderate - several treatment options, min-mod task modification necessary  REHAB POTENTIAL: Good for stated goals  PLAN:  OT FREQUENCY 2x's a week  OT DURATION: 12 weeks  PLANNED INTERVENTIONS ADL training, A/E training, UE ther. Ex, Manual therapy, neuromuscular re-education, moist heat modality, Paraffin Bath, Splinting, and  Pt./caregiver education   RECOMMENDED OTHER SERVICES: PT  CONSULTED AND AGREED WITH PLAN OF CARE: Patient  PLAN FOR NEXT SESSION: Continue Treatment as per established POC  Richardson Otter, MS, OTR/L  02/13/2024, 1:06 PM       "

## 2024-02-15 ENCOUNTER — Ambulatory Visit

## 2024-02-15 ENCOUNTER — Ambulatory Visit: Admitting: Occupational Therapy

## 2024-02-20 ENCOUNTER — Ambulatory Visit: Admitting: Occupational Therapy

## 2024-02-20 ENCOUNTER — Ambulatory Visit

## 2024-02-20 ENCOUNTER — Encounter: Payer: Self-pay | Admitting: Orthopedic Surgery

## 2024-02-20 ENCOUNTER — Non-Acute Institutional Stay (SKILLED_NURSING_FACILITY): Payer: Self-pay | Admitting: Orthopedic Surgery

## 2024-02-20 DIAGNOSIS — I951 Orthostatic hypotension: Secondary | ICD-10-CM | POA: Diagnosis not present

## 2024-02-20 DIAGNOSIS — G8250 Quadriplegia, unspecified: Secondary | ICD-10-CM

## 2024-02-20 DIAGNOSIS — F329 Major depressive disorder, single episode, unspecified: Secondary | ICD-10-CM

## 2024-02-20 DIAGNOSIS — G894 Chronic pain syndrome: Secondary | ICD-10-CM

## 2024-02-20 DIAGNOSIS — Z978 Presence of other specified devices: Secondary | ICD-10-CM | POA: Diagnosis not present

## 2024-02-20 DIAGNOSIS — Z86718 Personal history of other venous thrombosis and embolism: Secondary | ICD-10-CM | POA: Diagnosis not present

## 2024-02-20 DIAGNOSIS — M8000XD Age-related osteoporosis with current pathological fracture, unspecified site, subsequent encounter for fracture with routine healing: Secondary | ICD-10-CM | POA: Diagnosis not present

## 2024-02-20 DIAGNOSIS — I5032 Chronic diastolic (congestive) heart failure: Secondary | ICD-10-CM | POA: Diagnosis not present

## 2024-02-20 DIAGNOSIS — N319 Neuromuscular dysfunction of bladder, unspecified: Secondary | ICD-10-CM

## 2024-02-20 DIAGNOSIS — D509 Iron deficiency anemia, unspecified: Secondary | ICD-10-CM | POA: Diagnosis not present

## 2024-02-20 DIAGNOSIS — S82202S Unspecified fracture of shaft of left tibia, sequela: Secondary | ICD-10-CM

## 2024-02-20 NOTE — Progress Notes (Unsigned)
 " Location:  Other [facility].  Nursing Home Room Number: [facility] [id number] Place of Service:  SNF [phone]) Provider:  Greig Cluster, NP  PCP: Laurence Locus, DO  Patient Care Team: Laurence Locus, DO as PCP - General (Internal Medicine) Caro Harlene POUR, NP as Nurse Practitioner (Geriatric Medicine)  Extended Emergency Contact Information Primary Emergency Contact: [name],[name] Mobile Phone: [phone] Relation: Daughter Secondary Emergency Contact: Mathias Lynwood HERO Address: [address]          [place], [place] [address] [place] Home Phone: [phone] Mobile Phone: [phone] Relation: Spouse  Code Status:  DNR Goals of care: Advanced Directive information    01/23/2024   12:35 PM  Advanced Directives  Does Patient Have a Medical Advance Directive? Yes  Type of Estate Agent of [place];Living will;Out of facility DNR (pink MOST or yellow form)  Does patient want to make changes to medical advance directive? No - Patient declined  Copy of Healthcare Power of Attorney in Chart? Yes - validated most recent copy scanned in chart (See row information)     Chief Complaint  Patient presents with   Medical Management of Chronic Issues    Medical Management of Chronic Issues.     HPI:  Pt is a 89 y.o. female seen today for medical management of chronic diseases.    She currently resides on the skilled unit at [facility]. PMH: HTN, DVT, constipation, anemia, depression, chronic pain, malnutrition and neurogenic bladder.    Closed fracture tibia- 11/12 foot caught in shower and had increased pain to left ankle after event> xrays at facility suggested tibia fracture> followed by [facility]> cast removed last week, now wearing Cam boot, denies pain to LLE Tetraparesis- due to central cord syndrome of C6 secondary to neck fracture, hoyer transfer with Juditta chair, remains on skilled nursing Orthostatic hypotension- remains on midodrine  CHF-  BUN/creat 10/0.5 10/06/2023, no recent echo, remains on furosemide  Neurogenic bladder- has chronic indwelling foley Osteoporosis- remains on Fosamax, calcium /vitamin D  History of DVT- h/o BLE DVT, remains on Xarelto  Anemia- hgb 12.6 09/2023, remains in ferrous sulfate  Depression- husband passed 12/2023, denies increased depression, remains on Cymbalta  Chronic pain- remains on Cymbalta   Very pleasant during encounter. Does not like growth below left eye> dermatology reported it as a wart. Does not cause her pain or discomfort. Recent care plan noted SLUMS 26/30 in 2024. Plan to repeat memory test. Daughter main support person.   Recent weights:  01/20- 197.6 lbs  01/01- 194.4 lbs  12/15- 194 lbs  12/01- 197.4 lbs  Recent blood pressures:  01/19- 124/73, 160/90  01/18- 118/65, 143/88   Past Medical History:  Diagnosis Date   Acute blood loss anemia    Arthritis    Cancer (HCC)    skin   Central cord syndrome at C6 level of cervical spinal cord (HCC) 11/29/2017   Closed hip fracture (HCC) 08/15/2021   DVT (deep venous thrombosis) (HCC) 08/15/2021   Hypertension    Protein-calorie malnutrition, severe 01/24/2018   S/P insertion of IVC (inferior vena caval) filter 01/24/2018   SCC (squamous cell carcinoma) 04/08/2014   Formatting of this note might be different from the original.  ISCC of the right medial ankle treated with Mohs surgery on 04/08/14.  Formatting of this note might be different from the original.  ISCC of the right medial ankle treated with Mohs surgery on 04/08/14.     Tetraparesis [facility])    Past Surgical History:  Procedure Laterality  Date   ANTERIOR CERVICAL DECOMP/DISCECTOMY FUSION N/A 11/29/2017   Procedure: Cervical five-six, six-seven Anterior Cervical Decompression Fusion;  Surgeon: Cheryle Debby LABOR, MD;  Location: [facility] OR;  Service: Neurosurgery;  Laterality: N/A;  Cervical five-six, six-seven Anterior Cervical Decompression Fusion   CATARACT EXTRACTION      FEMUR IM NAIL Left 08/16/2021   Procedure: INTRAMEDULLARY (IM) NAIL FEMORAL;  Surgeon: Cleotilde Barrio, MD;  Location: [facility] ORS;  Service: Orthopedics;  Laterality: Left;   IR IVC FILTER PLMT / S&I /IMG GUID/MOD SED  12/08/2017    Allergies[1]  Outpatient Encounter Medications as of 02/20/2024  Medication Sig   acetaminophen  (TYLENOL ) 500 MG tablet Take 1,000 mg by mouth 3 (three) times daily.   acetaminophen  (TYLENOL ) 650 MG CR tablet Take 650 mg by mouth every 4 (four) hours as needed for pain.   acetic acid 0.25 % irrigation Use 30ml via irrigation twice daily to keep foley Cath patent.   alendronate (FOSAMAX) 70 MG tablet Take 70 mg by mouth every Saturday.   calcium  carbonate (CALCIUM  600) 600 MG TABS tablet Take 600 mg by mouth at bedtime.   Capsaicin  0.1 % CREA Apply 1 application  topically every evening. (Apply to hands)   cholecalciferol  (VITAMIN D3) 25 MCG (1000 UNIT) tablet Take 1,000 Units by mouth daily.   clobetasol (TEMOVATE) 0.05 % external solution Apply 1 Application topically once a week.   DULoxetine  (CYMBALTA ) 60 MG capsule Take 60 mg by mouth daily.   ferrous sulfate  325 (65 FE) MG tablet Take 325 mg by mouth daily with breakfast.   fexofenadine (ALLEGRA) 180 MG tablet Take 180 mg by mouth daily.   furosemide  (LASIX ) 20 MG tablet Take 20 mg by mouth. Every 24 hours as needed for weight gain >5lbs in 1 weeks.   furosemide  (LASIX ) 20 MG tablet Take 1 tablet (20 mg total) by mouth daily.   gabapentin  (NEURONTIN ) 100 MG capsule Take 1 capsule (100 mg total) by mouth at bedtime.   hydroxypropyl methylcellulose / hypromellose (ISOPTO TEARS / GONIOVISC) 2.5 % ophthalmic solution Place 1 drop into both eyes every hour as needed for dry eyes.   lactulose , encephalopathy, (CHRONULAC ) 10 GM/15ML SOLN Take 30 mLs by mouth daily as needed (mild constipation).   magnesium  citrate solution Take 60-120 mLs by mouth daily as needed (constipation).   Menthol , Topical Analgesic,  (BIOFREEZE COOL THE PAIN) 4 % GEL Apply 1 application  topically every 8 (eight) hours as needed (for left knee pain).   methylcellulose oral powder Take 1 packet by mouth at bedtime.   midodrine  (PROAMATINE ) 2.5 MG tablet Take 1 tablet (2.5 mg total) by mouth 3 (three) times daily as needed (SBP <=90 mm Hg).   Multiple Vitamin (MULTIVITAMIN WITH MINERALS) TABS tablet Take 1 tablet by mouth daily.   olopatadine (PATADAY) 0.1 % ophthalmic solution Place 1 drop into both eyes 2 (two) times daily.   ondansetron  (ZOFRAN ) 4 MG tablet Take 4 mg by mouth every 6 (six) hours as needed for nausea or vomiting.   polyethylene glycol (MIRALAX  / GLYCOLAX ) 17 g packet Take 17 g by mouth daily as needed. And daily scheduled in the evening   rivaroxaban  (XARELTO ) 10 MG TABS tablet Take 10 mg by mouth daily.   saccharomyces boulardii (FLORASTOR) 250 MG capsule Take 250 mg by mouth daily.   Saline Spray 0.2 % SOLN Place 2 sprays into both nostrils 3 (three) times daily as needed (nasal congestion).   Selenium  Sulfide 2.25 % SHAM Wash hair  twice a week   Simethicone  80 MG TABS Take 2 tablets (160 mg total) by mouth every 6 (six) hours as needed (bloating, flatuence).   sodium phosphate  (FLEET) 7-19 GM/118ML ENEM Place 1 enema rectally every other day as needed for severe constipation.   spironolactone  (ALDACTONE ) 25 MG tablet Take 12.5 mg by mouth daily.   tiZANidine  (ZANAFLEX ) 2 MG tablet Take 1 tablet (2 mg total) by mouth 2 (two) times daily.   triamcinolone  cream (KENALOG ) 0.1 % Apply to thigh and lower legs topically every 8 hours as needed. Apply to back of neck topically as needed.   cetirizine (ZYRTEC) 5 MG tablet Take 5 mg by mouth daily. (Patient not taking: Reported on 02/20/2024)   fluocinonide (LIDEX) 0.05 % external solution Apply 1 Application topically every 12 (twelve) hours as needed. (Patient not taking: Reported on 02/20/2024)   HYDROcodone -acetaminophen  (NORCO/VICODIN) 5-325 MG tablet Take 1 tablet  by mouth every 6 (six) hours as needed (pain). (Patient not taking: Reported on 02/20/2024)   ketoconazole (NIZORAL) 2 % shampoo Apply 1 Application topically as directed. Every shift on Thursday for seborrheic dermatitis flare up (Patient not taking: Reported on 02/20/2024)   No facility-administered encounter medications on file as of 02/20/2024.    Review of Systems  Constitutional:  Negative for fatigue and fever.  HENT:  Negative for sore throat and trouble swallowing.   Respiratory:  Negative for cough and shortness of breath.   Cardiovascular:  Positive for leg swelling. Negative for chest pain.  Gastrointestinal:  Negative for abdominal distention and abdominal pain.  Genitourinary:  Negative for dysuria and hematuria.  Musculoskeletal:  Positive for gait problem.  Skin:  Negative for wound.  Neurological:  Positive for weakness. Negative for dizziness and headaches.  Psychiatric/Behavioral:  Negative for confusion, dysphoric mood and sleep disturbance. The patient is not nervous/anxious.     Immunization History  Administered Date(s) Administered   INFLUENZA, HIGH DOSE SEASONAL PF 11/17/2021   Influenza-Unspecified 11/14/2018, 11/20/2019, 11/17/2020, 11/17/2021, 11/24/2022, 11/15/2023   Moderna Covid-19 Fall Seasonal Vaccine 52yrs & older 05/11/2022, 05/13/2023   Moderna Covid-19 Vaccine Bivalent Booster 1yrs & up 06/30/2021, 12/11/2021, 10/29/2022   Moderna Sars-Covid-2 Vaccination 02/09/2019, 03/09/2019, 12/14/2019, 06/20/2020, 10/24/2020   PNEUMOCOCCAL CONJUGATE-20 10/05/2022   PPD Test 05/16/2019   Tdap 11/29/2017   Unspecified SARS-COV-2 Vaccination 11/25/2023   Zoster Recombinant(Shingrix) 09/02/2022, 12/04/2022   Pertinent  Health Maintenance Due  Topic Date Due   Influenza Vaccine  Completed   Mammogram  Discontinued   Bone Density Scan  Discontinued      08/20/2021    8:20 AM 03/29/2022   10:11 AM 08/31/2022    2:42 PM 09/20/2023    9:15 AM 12/16/2023    3:08  PM  Fall Risk  Falls in the past year?  0 0 0 1  Was there an injury with Fall?  0    1   Fall Risk Category Calculator  0   2  (RETIRED) Patient Fall Risk Level High fall risk       Patient at Risk for Falls Due to  No Fall Risks   History of fall(s);Impaired balance/gait  Fall risk Follow up     Falls evaluation completed     Data saved with a previous flowsheet row definition   Functional Status Survey:    Vitals:   02/20/24 1113 02/20/24 1135  BP: (!) 160/90 (!) 157/91  Pulse: 68   Resp: 18   Temp: 98.2 F (36.8 C)  SpO2: 91%   Weight: 197 lb 9.6 oz (89.6 kg)   Height: 5' 7 (1.702 m)    Body mass index is 30.95 kg/m. Physical Exam Vitals reviewed.  Constitutional:      General: She is not in acute distress. HENT:     Head: Normocephalic.     Right Ear: There is no impacted cerumen.     Left Ear: There is no impacted cerumen.     Nose: Nose normal.     Mouth/Throat:     Mouth: Mucous membranes are moist.  Eyes:     General:        Right eye: No discharge.        Left eye: No discharge.  Cardiovascular:     Rate and Rhythm: Normal rate and regular rhythm.     Pulses: Normal pulses.     Heart sounds: Normal heart sounds.  Pulmonary:     Effort: Pulmonary effort is normal.     Breath sounds: Normal breath sounds.  Abdominal:     General: Bowel sounds are normal. There is no distension.     Palpations: Abdomen is soft.     Tenderness: There is no abdominal tenderness.  Musculoskeletal:     Cervical back: Neck supple.     Right lower leg: Edema present.     Left lower leg: Edema present.     Comments: Non piting  Skin:    General: Skin is warm.     Comments: Very small raised lesion below left eye, no skin breakdown, resembles skin tag or wart  Neurological:     General: No focal deficit present.     Mental Status: She is alert and oriented to person, place, and time.     Motor: Weakness present.     Gait: Gait abnormal.  Psychiatric:        Mood and  Affect: Mood normal.     Labs reviewed: Recent Labs    09/14/23 1400 09/19/23 0000 09/29/23 0000 10/06/23 0000  NA 137 140 141 141  K 3.6 4.2 4.5 4.2  CL 99 100 103 101  CO2 27 34* 34* 32*  GLUCOSE 143*  --   --   --   BUN 13 10 9 10   CREATININE 0.44 0.5 0.5 0.5  CALCIUM  8.7* 8.8 9.2 9.2   Recent Labs    05/09/23 0000 08/29/23 0000 09/14/23 1400  AST 21 17 26   ALT 16 12 18   ALKPHOS 59 53 53  BILITOT  --   --  1.0  PROT  --   --  6.6  ALBUMIN  3.6 3.6 3.6   Recent Labs    08/10/23 0000 08/29/23 0000 09/14/23 1400 10/06/23 0000  WBC 6.3 4.9 7.5 5.5  NEUTROABS 4,038.00 2,528.00  --  3,427.00  HGB 11.8* 12.2 12.6 13.0  HCT 36 37 38.0 41  MCV  --   --  94.1  --   PLT 280 305 285 330   Lab Results  Component Value Date   TSH 1.15 08/29/2023   No results found for: HGBA1C No results found for: CHOL, HDL, LDLCALC, LDLDIRECT, TRIG, CHOLHDL  Significant Diagnostic Results in last 30 days:  No results found.  Assessment/Plan 1. Closed fracture of shaft of left tibia, unspecified fracture morphology, sequela (Primary) - ongoing - followed by [facility] - cast removed last week - now wearing cam boot - denies pain   2. Tetraparesis (HCC) - cont skilled nursing   3. Orthostatic  hypotension - stable with midodrine   4. Chronic diastolic CHF (congestive heart failure) (HCC) - compensated  - cont furosemide   5. Neurogenic bladder  6. Chronic indwelling Foley catheter - continue monthly foley exchanges and care  7. Age-related osteoporosis with current pathological fracture with routine healing, subsequent encounter - 11/12 tibia fracture  - cont fosamax, calcium /vitamin D   8. History of DVT (deep vein thrombosis) - cont Xarelto   9. Iron deficiency anemia, unspecified iron deficiency anemia type - hgb stable with ferrous sulfate   10. Reactive depression - no changes in mood - very pleasant - supportive daughter  - cont  Cymbalta   11. Chronic pain syndrome - cont Cymbalta      Family/ staff Communication: plan discussed with patient and nurse  Labs/tests ordered:  none        [1]  Allergies Allergen Reactions   Other Swelling   Lisinopril-Hydrochlorothiazide Swelling    Swelling of the tongue   Bovine (Beef) Protein-Containing Drug Products Swelling    Facial and lip swelling (due to tick bite)   Dairy Aid [Tilactase] Swelling   Lisinopril-Hydrochlorothiazide Swelling    Tongue swelling   Porcine (Pork) Protein-Containing Drug Products Swelling   "

## 2024-02-22 ENCOUNTER — Ambulatory Visit

## 2024-02-22 ENCOUNTER — Ambulatory Visit: Admitting: Occupational Therapy

## 2024-02-22 DIAGNOSIS — M6281 Muscle weakness (generalized): Secondary | ICD-10-CM | POA: Diagnosis not present

## 2024-02-22 NOTE — Therapy (Addendum)
 " Occupational Therapy Neuro Treatment Note Patient Name: Sherry [name] MRN: [id number] DOB:Feb 05, 1934, 89 y.o., female Today's Date: 11/29/2022  PCP:  [facility] PROVIDER: Abdul Fine, MD  END OF SESSION:   OT End of Session - 02/22/24 1543     Visit Number [id number]    Number of Visits 216    Date for Recertification  04/25/24    Authorization Time Period Progress report period starting 04/04/23    OT Start Time 1103    OT Stop Time 1145    OT Time Calculation (min) 42 min    Equipment Utilized During Treatment powered wheelchair    Activity Tolerance Patient tolerated treatment well              Past Medical History:  Diagnosis Date   Acute blood loss anemia    Arthritis    Cancer (HCC)    skin   Central cord syndrome at C6 level of cervical spinal cord (HCC) 11/29/2017   Hypertension    Protein-calorie malnutrition, severe (HCC) 01/24/2018   S/P insertion of IVC (inferior vena caval) filter 01/24/2018   Tetraparesis (HCC)    Past Surgical History:  Procedure Laterality Date   ANTERIOR CERVICAL DECOMP/DISCECTOMY FUSION N/A 11/29/2017   Procedure: Cervical five-six, six-seven Anterior Cervical Decompression Fusion;  Surgeon: Cheryle Debby DELENA, MD;  Location: [facility] OR;  Service: Neurosurgery;  Laterality: N/A;  Cervical five-six, six-seven Anterior Cervical Decompression Fusion   CATARACT EXTRACTION     FEMUR IM NAIL Left 08/16/2021   Procedure: INTRAMEDULLARY (IM) NAIL FEMORAL;  Surgeon: Cleotilde Barrio, MD;  Location: [facility] ORS;  Service: Orthopedics;  Laterality: Left;   IR IVC FILTER PLMT / S&I /IMG GUID/MOD SED  12/08/2017   Patient Active Problem List   Diagnosis Date Noted   Age-related osteoporosis without current pathological fracture 07/27/2022   Mild recurrent major depression (HCC) 07/27/2022   Hypertensive heart disease with other congestive heart failure (HCC) 02/14/2022   Chronic indwelling Foley catheter 02/14/2022   Closed hip fracture (HCC)  08/15/2021   DVT (deep venous thrombosis) (HCC) 08/15/2021   Quadriplegia (HCC) 08/15/2021   Fall 08/15/2021   Constipation due to slow transit 08/31/2018   Trauma 06/05/2018   Neuropathic pain 06/05/2018   Neurogenic bowel 06/05/2018   Vaginal yeast infection 01/30/2018   Healthcare-associated pneumonia 01/25/2018   Chronic allergic rhinitis 01/24/2018   Depression with anxiety 01/24/2018   UTI due to Klebsiella species 01/24/2018   Protein-calorie malnutrition, severe (HCC) 01/24/2018   S/P insertion of IVC (inferior vena caval) filter 01/24/2018   Tetraparesis (HCC) 01/20/2018   Neurogenic bladder 01/20/2018   Reactive depression    Benign essential HTN    Acute postoperative anemia due to expected blood loss    Central cord syndrome at C6 level of cervical spinal cord (HCC) 11/29/2017   Allergy to alpha-gal 11/25/2016   SCC (squamous cell carcinoma) 04/08/2014   ONSET DATE: 11/29/2017  REFERRING DIAG: Central Cord Syndrome at C6 of the Cervical Spinal Cord, Fall with Bilateral Closed Hip Fractures, with ORIF repair with Intramedullary Nailing of the Left Hip Fracture.   THERAPY DIAG:  Muscle weakness (generalized)  Other lack of coordination  Rationale for Evaluation and Treatment: Rehabilitation  SUBJECTIVE:  SUBJECTIVE STATEMENT:  Patient report that her BP was good this morning at [facility], however reports that her LUE's were elevated, however has to have her LEs in the down position when in the [place], and when at therapy.  PERTINENT HISTORY: Central Cord Syndrome at  C6 of the Cervical Spinal Cord, Fall with Bilateral Closed Hip Fractures, with ORIF repair with Intramedullary Nailing of the Left Hip Fracture.   PRECAUTIONS: None  WEIGHT BEARING RESTRICTIONS: No  PAIN:  Are you having pain? No pain   LIVING ENVIRONMENT: Lives with: [facility] Stairs: No Has following equipment at home: Wheelchair (power)  PLOF: Independent   PATIENT  GOALS:  See below for established goals  OBJECTIVE:  Note: Objective measures were completed at Evaluation unless otherwise noted.  HAND DOMINANCE: Right  ADLs: Caregiver assist with ADLs, and [facility]  MOBILITY STATUS: Uses a power w/c  ACTIVITY TOLERANCE: Activity tolerance: Fair  FUNCTIONAL OUTCOME MEASURES:  Measurements:   12/09/2021:   Shoulder flexion: R: 120(136), L: 138(150) Shoulder abduction: R: 108(120), L: 110(120) Elbow: R: 0-148, L: 0-146 Wrist flexion: R: 55(62), L: 78 Writ extension: R: 34(55), L: 3(4) RD: R: 12(20), L: 16(26) UD: R: 8(24), L: 6(18) Thumb radial abduction: R: 11(20), L: 8(20) Digit flexion to the [facility]: R:  2nd: 5cm(3.5cm), 3rd: 7.5 cm(5cm), 4th: 7cm(3cm), 5th: 6cm(5.5cm) L: 2nd: 8cm(8cm), 3rd: 8cm(8cm), 4th: 7.5cm(7cm), 5th: 7cm(7cm)   02/03/2022:   Shoulder flexion: R: 122(138), L: 148(154) Shoulder abduction: R: 109(125), L: 125(125 Elbow: R: 0-148, L: 0-146 Wrist flexion: R: 60(68), L: 79 Writ extension: R: 40(55), L: 3(10) RD: R: 14(24), L: 20(28) UD: R: 8(24), L: 6(18) Thumb radial abduction: R: 20(25), L: 8(20) Digit flexion to the [facility]: R:  2nd: 4.5cm(3cm), 3rd: 6.5cm(4.5cm), 4th: 6.5 cm(3cm), 5th: 4.5cm(5cm) L: 2nd: 8cm(8cm), 3rd: 8cm(7cm), 4th: 7.5cm(7cm), 5th: 6.5cm(6.5cm)   04/07/2022:   Shoulder flexion: R: 125(150), L: 160(160) Shoulder abduction: R: 109(125), L: 125(125 Elbow: R: 0-148, L: 0-146 Wrist flexion: R: 60(68), L: 79 Writ extension: R: 40(55), L: 5(18) RD: R: 14(24), L: 20(28) UD: R: 18(24), L: 8(18) Thumb radial abduction: R: 20(25), L: 8(20) Digit flexion to the [facility]: R:  2nd: 4.5 cm(4 cm), 3rd: 6.5cm(3cm), 4th: 7 cm (5cm), 5th: 5cm(5cm) L: 2nd: 8cm(8cm), 3rd: 8cm(7cm), 4th: 7.5cm(7cm), 5th: 7cm(7cm)   07/05/2022:   Shoulder flexion: R: 125(154), L: 160(160) Shoulder abduction: R: 95(130), L: 143(143) Elbow: R: 0-148, L: 0-146 Wrist flexion: R: 60(68), L: 79 Writ extension: R: 40(60), L:  0(12) RD: R: 10(24), L: 12(20) UD: R: 16(24), L: 8(16) Thumb radial abduction: R: 20(25), L: 8(20) Digit flexion to the [facility]: R:  2nd: 4 cm(4cm), 3rd: 6.5cm( 3.5cm), 4th: 7 cm (5cm), 5th: 6cm(5cm) L: 2nd: 8cm(8cm), 3rd: 8cm(7cm), 4th: 8cm(7cm), 5th: 7cm(7cm)   09/13/2022:   Shoulder flexion: R: 125(154), L: 160(160) Shoulder abduction: R: 100(130), L: 143(143) Elbow: R: 0-150, L: 0-150 Wrist flexion: R: 55(68), L: 79 Writ extension: R: 45(60), L: 0(12) RD: R: 10(24), L: 12(20) UD: R: 16(24), L: 8(16) Thumb radial abduction: R: 20(25), L: 8(20) Digit flexion to the [facility]: R:  2nd: 4 cm(4cm), 3rd: 6cm( 5cm), 4th: 6 cm (4cm), 5th: 5cm(5cm) L: 2nd: 7cm(cm), 3rd: 7cm(6cm), 4th: 7cm(6cm), 5th: 6cm(6cm)   10/27/2022   Shoulder flexion: R: 128(154), L: 160(160) Shoulder abduction: R: 105(130), L: 143(143) Elbow: R: 0-150, L: 0-150 Wrist flexion: R: 55(68), L: 79 Writ extension: R: 46(64), L: 0(20) RD: R: 10(20), L: 18(24) UD: R: 18(28), L: 8(20) Thumb radial abduction: R: 30(42), L:20(24) Digit flexion to the [facility]: R:  2nd: 4 cm(3cm), 3rd: 6cm( 6cm), 4th: 6 cm (5cm), 5th: 5cm(5cm) L: 2nd: 8cm(8cm), 3rd: 8cm(6cm), 4th: 8cm(7cm), 5th: 6cm(6cm)  01/19/2023  Shoulder flexion: R: 131(154), L: 160(160) Shoulder abduction:  R: 110(130), L: 143(143) Elbow: R: 0-150, L: 0-150 Wrist flexion: R: 55(68), L: 79 Writ extension: R: 46(64), L: 0(20) RD: R: 10(20), L: 18(24) UD: R: 18(28), L: 14(20) Thumb radial abduction: R: 38(48), L:24(26) Thumb IP flexion: R: 35(50) L: 20(45) Digit flexion to the [facility]: R:  2nd: 3.5 cm(3cm), 3rd: 6cm( 6cm), 4th: 6 cm (5cm), 5th: 5cm(4.5cm) L: 2nd: 8cm(7.5cm), 3rd: 8cm(6cm), 4th: 7.5cm(6cm), 5th: 6cm(4.5cm)  04/04/2023  Shoulder flexion: R: 134(154), L: 160(160) Shoulder abduction: R: 114(130), L: 143(143) Elbow: R: WNL, L: WNL Wrist flexion: R: 55(68), L: 80 Writ extension: R: 46(64), L: -10(10) RD: R: 14(24), L: 20(24) UD: R: 20(30), L: 14(20) Thumb  radial abduction: R: 38(48), L:28(30) Thumb IP flexion: R: 40(50) L: 25(45) Digit flexion to the [facility]: R:  2nd: 3.5 cm(3cm), 3rd: 6cm( 5cm), 4th: 6 cm (4cm), 5th: 5cm(5cm) L: 2nd: 8cm(7.5cm), 3rd: 8cm(7cm), 4th: 7.5cm(7cm), 5th: 7cm(6cm)  05/25/2023  Shoulder flexion: R: 134(154), L: 160(160) Shoulder abduction: R: 117(134), L: 143(143) Elbow: R: WNL, L: WNL Wrist flexion: R: 55(68), L: 80 Wrist extension: R: 46(64), L: -8(14) RD: R: 14(24), L: 20(24) UD: R: 20(32), L: 14(22) Thumb radial abduction: R: 38(50), L:28(30) Thumb IP flexion: R: 40(50) L: 30(45) Digit flexion to the [facility]: R:  2nd: 3.5 cm(3cm), 3rd: 6cm( 5cm), 4th: 7 cm (4cm), 5th: 5cm(4cm) L: 2nd: 8cm(7.5cm), 3rd: 8cm(7cm), 4th: 7.5cm(7cm), 5th: 7cm(6cm)  07/13/23  Shoulder flexion: R: 134(154), L: 160(160) Shoulder abduction: R: 122(134), L: 143(143) Elbow: R: WNL, L: WNL Wrist flexion: R: 55(68), L: 80 Wrist extension: R: 50(64), L: -4(18) RD: R: 18(24), L: 24(24) UD: R: 20(32), L: 14(22) Thumb radial abduction: R: 38(50), L:32(30) Thumb IP flexion: R: 42(50) L: 30(45) Digit flexion to the [facility]: R:  2nd: 3.5 cm(3cm), 3rd: 6cm( 5cm), 4th: 7 cm (4cm), 5th: 5cm(4cm) L: 2nd: 8cm(7.5cm), 3rd: 8cm(7cm), 4th: 7.5cm(7cm), 5th: 7cm(6cm)  11/02/23:   Shoulder flexion: R: 136(154), L: 160(160) Shoulder abduction: R: 123(134), L: 143(143) Elbow: R: WNL, L: WNL Wrist flexion: R: 55(68), L: 80 Wrist extension: R: 30(38), L: -16(18) RD: R: 8(18), L: 18(24) UD: R: 12(24), L: 8(22) Thumb radial abduction: R: 28(50), L:28(30) Thumb IP flexion: R: 42(55) L: 10(25) Digit flexion to the [facility]: R:  2nd: 4.5cm(4cm), 3rd: 6cm( 4.5cm), 4th: 6.5cm (5cm), 5th: 5cm(4.5cm) L: 2nd: 8cm(7.5cm), 3rd: 7.5cm(7cm), 4th: 7cm(7cm), 5th: 6cm(6cm)  02/06/24:  Shoulder flexion: R: 136(154), L: 160(160) Shoulder abduction: R: 123(134), L: 143(143) Elbow: R: WNL, L: WNL Wrist flexion: R: 55(68), L: 80 Wrist extension: R: 36(42), L: -10(22) RD:  R: 8(18), L: 22(24) UD: R: 18(24), L: 10(22) Thumb radial abduction: R: 40 (50), L:30(32) Thumb IP flexion: R: 42(55) L: 15(25) Digit flexion to the [facility]: R:  2nd: 4.5cm(4cm), 3rd: 6cm( 4.5cm), 4th: 6.5cm (5cm), 5th: 5cm(4.5cm) L: 2nd: 7.5cm(7.5cm), 3rd: 7cm(6.5cm), 4th: 7cm(6.5cm), 5th: 5cm(5cm)       COORDINATION:   01/19/2023:  9 hole peg test:  Right: 2 pegs placed in 1 min. & 5 sec.  Pt. was able to remove 9 pegs in 17 sec.  Left: 2 pegs placed in 1 min. & 10 sec. Pt. was able to remove 9 pegs in 20 sec.  04/04/23:  Pt. was able to consistently grasp the pegs however each one slipped out of her fingers when attempting to place them into the Pegboard.  05/25/2023:   Right: 9 pegs placed and removed in 2 min. & 41 sec. Left: Unable to grasp the pegs for a horizontal position.  07/13/23  Right: 9 pegs placed and removed in 2 min. & 11 sec.     11/02/23:                Right 9 Hole Peg Test: 2 min. & 51 sec.   02/06/24:   Right 9 Hole Peg Test: 2 min. & 28 sec.with the board set-up on a laptop pillow    SENSATION: Intact  EDEMA: N/A   COGNITION: Overall cognitive status: WNL   VISION: Subjective report: Wears glasses, No changes in vision.  PERCEPTION: Intact  TODAY'S TREATMENT:                                                                                                                              DATE: 02/22/24    Manual Therapy:   -Pt. tolerated soft tissue massage to the right hand at the volar, dorsal, lateral, and medical aspects of the bilateral digits /2 stiffness. Manual therapy was performed independent of, and in preparation for ROM, and therapeutic ex.    Therapeutic Ex.:    -Pt. tolerated PROM with slow prolonged gentle stretching to the bilateral wrist extension with increased focus on left wrist extension. AAROM/PROM for bilateral digit MP, PIP, and DIP flexion, and extension, thumb abduction was performed in conjunction with moist heat  modality 2/2 increased extensor tone, and tightness in the left wrist, and bilateral digits.  -Performed BUE strengthening using a 3# dowel ex. 2/2 to weakness. Bilateral shoulder flexion, chest press, circular patterns, and elbow flexion/extension for 2 sets- 1st set 10 reps, 2nd set 5-10 reps each.     PATIENT EDUCATION: Education details: Bilateral hand  ROM, condition management, self-care: Condition Management: BP Person educated: Patient Education method: Explanation, Demonstration, and Verbal cues Education comprehension: verbalized understanding and returned demonstration  HOME EXERCISE PROGRAM: Continue to assess HEP needs, and provide as needed.   GOALS: Goals reviewed with patient? Yes  LONG TERM GOALS: Target date: 04/25/2024  3.  Patient will independently use a reacher to pick up items from various surfaces.  Baseline: 11/02/23: Pt. is now able to consistently pick up small objects from the floor with a reacher. 07/13/2023: Pt. is starting to use a reacher to retrieve items with an adaptation/modification to the trigger. 05/25/2023:  Pt. is now able to initiate grasping a reacher to open and close the reacher with the right hand, however requires the grasp handle to be repositioned  within the hand. Pt. requires work on using/moving the reacher with the right hand through various planes. 04/04/2023: Pt. continues to work on improving Digit flexion to the [facility] to be able to efficiently handle Adaptive equipment. 01/19/2023: Pt. Is improving with Bilateral digit flexion to the [facility]. Pt. has difficulty firmly holding adaptive devices. 12/06/2022: Pt. continues to present with increased MP, PIP, and DIP digit tightness/stiffness limiting the formulation of bilateral composites fists. 10/27/2022: Pt. presents with increased MP, PIP, and DIP digit tightness/stiffness limiting the formulation of bilateral  composites fists during this progress reporting period limiting. Digit flexion to the [facility]: R:   2nd: 4 cm(3cm), 3rd: 6cm( 6cm), 4th: 6 cm (5cm), 5th: 5cm(5cm), L: 2nd: 8cm(8cm), 3rd: 8cm(6cm), 4th: 8cm(7cm), 5th: 6cm(6cm) 09/20/2022: Improving digit flexion to the [facility]. 09/13/22: improved digit flexion to the [facility]. 07/05/2022: Pt. presents with digit MP, PIP, and DIP extensor tightness limiting her ability to securely grip objects in her bilateral hands.  04/07/2022: Pt. Has improved with right 2nd, and 3rd digit flexion towards the [facility]. Pt. Continues to have difficulty securely holding and applying deodorant.02/03/2022: Pt. has improved with digit flexion, however, continues to have difficulty securely holding and using deodorant. 12/09/2021: Bilateral hand/digit MP, PIP, and DIP extension tightness limits her ability achieve digit flexion to hold, and apply deodorant. 10/28/2021:  Pt. continues to have difficulty holding the deodorant. 01/12/2021: Pt. presents with limited digit extension. Pt. Is able to initiate holding deodorant, however is unable to hold it while using it  Goal status:  Achieved   4.  Pt. Will improve bilateral wrist extension in preparation for anticipating, and initiating reaching for objects at the table.  Baseline: 02/06/24: Wrist extension: R: 36(42), L: -10(22) 2/31/25: Pt. Continues to present with tightness  however presents with more tightness in the left wrist. 11/30/23: Continue 11/02/23: Wrist extension: R: 30(38), L: -16(18) 07/13/23: Wrist extension: R: 50(64), L: -4(18) 05/25/23: Wrist extension: R: 46(64), L: -8(14) 04/04/23: R: 46(64) L: -10(10)01/19/2023:  R: 46(64) L: 0(20) 12/06/2022: Pt continues to to progress with bilateral wrist extension in preparation fro functional reaching. 10/27/2022: Pt. Is improving with bilateral wrist extension. R: 46(64) L: 0(20) 09/20/2022: limited PROM in the Left wrist extension 2/2 tightness/stiffness. 09/13/22: R: 55(68), L: 0(12) 07/19/2022: Bilateral wrist extension in limited. 07/05/2022: Right: 40(60) Left: 0(12) 04/07/2022: Right: 40(55)  Left: 5(18) 02/03/2022: Right: 34(55) Left: 3(4) 12/09/2021: Right  34(55)  Left  3(4) 10/28/2021: Right 22(38), Left 0(15) 09/14/2021:  Right: 17(35), left 2(15)  Goal status: Ongoing  7.  Pt. Will increase bilateral lateral pinch strength by 2 lbs to be able to securely grasp items during ADLs, and IADL tasks.  Baseline: 01/19/2023: Deferred as the Pinch meter is out for calibration.12/06/2022: Pt. Is able to more securely hold objects during ADLs/IADLs 10/27/2022: Pt. Is improving with holding items with her bilateral thumbs. (Pinch meter out for calibration) 09/20/2022: lateral pinch continues to be limited 09/13/22: R 6.5# L 4# 07/19/2022: TBD 07/05/2022: TBD 07/2022: NT-Pinch meter out for calibration. 02/03/2022: Right: 6#, Left: 4# 12/09/2021: Right: 6#, Left: 4#  Goal status: Deferred   9.  Pt. will complete plant care with modified independence.  Baseline:12/06/2022: Pt. Is independent with plant care in her husband's room, however is not able to access her plants due to furniture blocking access to them. 10/27/2022: Pt. Is able to water plants that are closer, and within reach. Pt. continues to have difficulty reaching for thorough plant care. 09/20/2022: Pt. Continues to have difficulty reaching for thorough plant care. 09/13/22: continues to report intermittent difficulty 07/19/2022: Pt. continues to be able to water, and care for some of her plants. Pt. Has more difficulty with plants that are harder to reach. 07/05/2022: Pt. is able to water, and care for some of her plants. Pt. Has more difficulty with plants that are harder to reach. 04/07/2022: Pt. is now able to hold a cup and water her plants.02/03/2022: Pt. has difficulty caring for her plants.  Goal status:Achieved   10.  Pt. will demonstrate adaptive techniques  to assist with the efficiency of self-dressing, or morning care tasks.  Baseline: 07/13/23:Pt. continues to work on improving UE functioning to be able to efficiently use Adaptive equipment  05/25/2023: Pt. continues to work on improving UE functioning to be able to efficiently use Adaptive equipment.  04/04/2023:  Pt. continues to work on improving UE functioning to be able to efficiently use Adaptive equipment. 01/19/2203: Pt. Is able to donn her jacket in reverse independently. Pt. requires assist from staff, as Pt. Reports staff have decreased time. 12/06/2022: Pt. requires assist from staff, as Pt. Reports staff have decreased time. 10/27/2022: Pt. is able to assist with initiating UE dressing. Pt. requires assist from personal, and staff care aides. 09/20/2022: Pt. continues to require assist form personal, and staff care aides. 09/13/22: MODA dressing, reports she is often rushed 07/19/2022: Pt. continues to require assist with self-dressing/morning care tasks. 07/05/2022: Continue with goal. 04/07/2022: Pt. Continues to require assist with the efficiency of self-dressing, and morning care tasks.02/03/2022: Pt. requires assist from caregivers 2/2 time limitations during morning care.  Goal status:  Deferred- Has caregiver assist and morning routine in place.  11.  Pt. will improve BUE strength to improve ADL, and IADL functioning.  Baseline:02/06/24: flexion L 4-/5, R 4-/5; shoulder abduction L 4-/5, R 4-/5. elbow flexion B 4/5. 02/01/24: Bilateral UE strength continues to be limited 11/30/2023: Continue 11/02/23: flexion L 4-/5, R 4-/5; shoulder abduction L 4-/5, R 4-/5. elbow flexion B 4/5,01/19/2023: L 4/5, R 3+/5  07/13/23: flexion L 4/5, R 4-/5; shoulder abduction L 4+/5, R 4-/5. elbow flexion B 5/5, elbow extension 4+/5 01/19/2023: L 4/5, R 3+/5  05/25/2023: shoulder flexion L 4/5, R 4-/5; shoulder abduction L 4+/5, R 4-/5. elbow flexion B 5/5, elbow extension 4+/5 01/19/2023: L 4/5, R 3+/5  04/04/2023: shoulder flexion L 4/5, R 3+/5; shoulder abduction L 4+/5, R 3+/5. elbow flexion B 5/5, elbow extension 4/5 01/19/2023: L 4/5, R 3+/5 12/06/2022: Continue 10/27/2022: shoulder flexion L 4/5, R 3+/5;  shoulder abduction L 4+/5, R 3+/5. elbow flexion B 5/5, elbow extension 4/5 09/20/2022: Pt. Presents with limited BUE strength 09/13/22: shoulder flexion L 4/5, R 3/5; elbow flexion B 5/5, elbow extension 4-/5; shoulder abduction L 4+/5, R 3+/5. 07/19/2022: BUE strength continues to be limited. 07/05/2022: shoulder flexion: right 4-/5, abduction: 3+/5, elbow flexion: right: 5/5, left 5/5, extension: right: 4-/5, left 4-/5, wrist extension: right: 3-/5, left: 2-/5  Goal status: Ongoing  12.  Pt. will improve bilateral thumb radial abduction in order to be able to hold the grab bars while standing with PT  Baseline:02/06/24:Thumb radial abduction: R: 40 (50), L:30(32) Pt. is improving with thumb radial abduction during grasping, however has ankle cast in place therefore is unable to work on grasping parallel bars in standing.02/01/24: Pt. continues to improve with attempting more thumb abduction needed to reach, and grasp items.11/30/2023: Continue 11/02/23: Thumb radial abduction: R: 28(50), L:28(30) 07/13/23: Pt. Is improving with holding the grab bars with the right hand 05/25/23: Thumb radial abduction: R: 38(50), L:28(30) 04/04/23: R: 38(48), L:28(30) 01/19/2023: R: 38(48) L: 24(26)12/06/2022: Pt. Continues to present  with limited thumb abduction, however is improving holding onto the parallel bars.10/27/2022: thumb radial abduction: R: 30(42), L: 20(24  Goal status: Ongoing  13.  Pt. will be able to securely hold, and stabilize medication bottles at a tabletop surface when opening, and closing them.  Baseline: 01/19/2023: Pt. is now able to securely hold medication bottles while opening them, and stabilizes them on surfaces while opening  them. 12/06/2022: Pt is improving with securely stabilizing medication bottles. 10/27/2022: Pt. Stabilizes bottles against her torso when attempting to open, and close them   Goal status: Achieved  14: Pt. Will improve bilateral thumb IP flexion to improve active grasping  patterns.    Baseline: 02/06/23: Thumb IP flexion: R: 42(55) L: 15(25)  02/01/24: pt. Presents with tightness with bilateral thumb IP flexion.11/30/2023: Continue 11/02/23: Thumb IP flexion: R: 42(55) L: 10(25) 07/13/23: Thumb IP flexion: R: 42(50) L: 30(45) 05/25/2023: Thumb IP flexion: R: 40(50) L: 30(45) 04/04/2023:  R: 40(50) L: 25(45) 01/19/2023: Right 35(50), Left: 20/45)    Goal status: Ongoing    15. Pt. Will improve bilateral FMC/speed, and dexterity. As evidence by improved scores on the 9 hole peg test. Baseline: 02/06/24: Right 9 Hole Peg Test: 2 min. & 28 sec.with the board set-up on a laptop pillow 02/01/24: TBD 11/30/2023: Limited bilateral fine motor coordination;continue 11/02/23: 2 min. & 51 sec. 07/13/2023: Right: 9 pegs placed and removed in 2 min. & 11 sec. 05/25/2023: 9 Hole pegs test speed/dexterity: Right: 9 pegs placed and removed in 2 min. & 41 sec. Left: Unable to grasp the pegs for a horizontal position. 04/04/23: Pt. was able to consistently grasp the pegs however each one slipped out of her fingers when attempting to place them into the Pegboard. 01/19/2023: 2 pegs placed in 1 min. & 5 sec.  Pt. was able to remove 9 pegs in 17 sec.; Left: 2 pegs placed in 1 min. & 10 sec. Pt. was able to remove 9 pegs in 20 sec.   Goal status: Ongoing    16. Pt. Will donn/doff a jacket with modified independence   Baseline: 02/01/24: Independent-Pt. Wears her coat in reverse direction. 11/30/2023: Continue 11/02/23: MaxA   Goal status: Achieved.     ASSESSMENT:  CLINICAL IMPRESSION:  Upon arrival, Pt.'s BP 83/51 with HR 75 bpms. Pt. BLE's were elevated, and the w/c was placed in the reclined position. After elevating her LE's in the w/c, and reclining in the w/c her BP improved to 104/59 with HR 67 bpms. Pt. tolerated STM, and slow gentle prolonged stretching well today without reports of pain. Pt. continues to present with increased extensor tightness in her wrist, and digits with increased  tightness in the left. Pt. continues to benefit from OT services to work on impoving ROM, and UE strength in order to work towards increasing bilateral hand grasp on objects, and increasing engagement of bilateral hands during ADLs, and IADL tasks    PERFORMANCE DE PERFORMANCE DEFICITS: in functional skills including ADLs, IADLs, coordination, dexterity, ROM, strength, and UE functional use, cognitive skills including , and psychosocial skills including coping strategies and environmental adaptation.   IMPAIRMENTS: are limiting patient from ADLs, IADLs, and leisure.   CO-MORBIDITIES: may have co-morbidities  that affects occupational performance. Patient will benefit from skilled OT to address above impairments and improve overall function.  MODIFICATION OR ASSISTANCE TO COMPLETE EVALUATION: Min-Moderate modification of tasks or assist with assess necessary to complete an evaluation.  OT OCCUPATIONAL PROFILE AND HISTORY: Detailed assessment: Review of records and additional review of physical, cognitive, psychosocial history related to current functional performance.  CLINICAL DECISION MAKING: Moderate - several treatment options, min-mod task modification necessary  REHAB POTENTIAL: Good for stated goals  PLAN:  OT FREQUENCY 2x's a week  OT DURATION: 12 weeks  PLANNED INTERVENTIONS ADL training, A/E training, UE ther. Ex, Manual therapy, neuromuscular re-education, moist heat modality, Paraffin Bath, Splinting, and  Pt./caregiver education   RECOMMENDED OTHER SERVICES: PT  CONSULTED AND AGREED WITH PLAN OF CARE: Patient  PLAN FOR NEXT SESSION: Continue Treatment as per established POC  Richardson Otter, MS, OTR/L  02/22/24, 3:44 PM       "

## 2024-02-27 ENCOUNTER — Ambulatory Visit

## 2024-02-27 ENCOUNTER — Ambulatory Visit: Admitting: Occupational Therapy

## 2024-02-29 ENCOUNTER — Ambulatory Visit: Admitting: Occupational Therapy

## 2024-02-29 ENCOUNTER — Ambulatory Visit

## 2024-03-05 ENCOUNTER — Ambulatory Visit

## 2024-03-05 ENCOUNTER — Ambulatory Visit: Admitting: Occupational Therapy

## 2024-03-07 ENCOUNTER — Ambulatory Visit: Attending: Internal Medicine | Admitting: Occupational Therapy

## 2024-03-07 ENCOUNTER — Ambulatory Visit

## 2024-03-07 DIAGNOSIS — M6281 Muscle weakness (generalized): Secondary | ICD-10-CM

## 2024-03-07 NOTE — Therapy (Signed)
 " Occupational Therapy Neuro Treatment Note Patient Name: Sherry [name] MRN: [id number] DOB:March 06, 1934, 89 y.o., female Today's Date: 11/29/2022  PCP:  [facility] PROVIDER: Abdul Fine, MD  END OF SESSION:   OT End of Session - 03/07/24 1757     Visit Number [id number]    Number of Visits 216    Date for Recertification  04/25/24    OT Start Time 1107    OT Stop Time 1145    OT Time Calculation (min) 38 min    Equipment Utilized During Treatment powered wheelchair    Activity Tolerance Patient tolerated treatment well    Behavior During Therapy WFL for tasks assessed/performed              Past Medical History:  Diagnosis Date   Acute blood loss anemia    Arthritis    Cancer (HCC)    skin   Central cord syndrome at C6 level of cervical spinal cord (HCC) 11/29/2017   Hypertension    Protein-calorie malnutrition, severe (HCC) 01/24/2018   S/P insertion of IVC (inferior vena caval) filter 01/24/2018   Tetraparesis (HCC)    Past Surgical History:  Procedure Laterality Date   ANTERIOR CERVICAL DECOMP/DISCECTOMY FUSION N/A 11/29/2017   Procedure: Cervical five-six, six-seven Anterior Cervical Decompression Fusion;  Surgeon: Cheryle Debby DELENA, MD;  Location: [facility] OR;  Service: Neurosurgery;  Laterality: N/A;  Cervical five-six, six-seven Anterior Cervical Decompression Fusion   CATARACT EXTRACTION     FEMUR IM NAIL Left 08/16/2021   Procedure: INTRAMEDULLARY (IM) NAIL FEMORAL;  Surgeon: Cleotilde Barrio, MD;  Location: [facility] ORS;  Service: Orthopedics;  Laterality: Left;   IR IVC FILTER PLMT / S&I /IMG GUID/MOD SED  12/08/2017   Patient Active Problem List   Diagnosis Date Noted   Age-related osteoporosis without current pathological fracture 07/27/2022   Mild recurrent major depression (HCC) 07/27/2022   Hypertensive heart disease with other congestive heart failure (HCC) 02/14/2022   Chronic indwelling Foley catheter 02/14/2022   Closed hip fracture (HCC) 08/15/2021    DVT (deep venous thrombosis) (HCC) 08/15/2021   Quadriplegia (HCC) 08/15/2021   Fall 08/15/2021   Constipation due to slow transit 08/31/2018   Trauma 06/05/2018   Neuropathic pain 06/05/2018   Neurogenic bowel 06/05/2018   Vaginal yeast infection 01/30/2018   Healthcare-associated pneumonia 01/25/2018   Chronic allergic rhinitis 01/24/2018   Depression with anxiety 01/24/2018   UTI due to Klebsiella species 01/24/2018   Protein-calorie malnutrition, severe (HCC) 01/24/2018   S/P insertion of IVC (inferior vena caval) filter 01/24/2018   Tetraparesis (HCC) 01/20/2018   Neurogenic bladder 01/20/2018   Reactive depression    Benign essential HTN    Acute postoperative anemia due to expected blood loss    Central cord syndrome at C6 level of cervical spinal cord (HCC) 11/29/2017   Allergy to alpha-gal 11/25/2016   SCC (squamous cell carcinoma) 04/08/2014   ONSET DATE: 11/29/2017  REFERRING DIAG: Central Cord Syndrome at C6 of the Cervical Spinal Cord, Fall with Bilateral Closed Hip Fractures, with ORIF repair with Intramedullary Nailing of the Left Hip Fracture.   THERAPY DIAG:  Muscle weakness (generalized)  Other lack of coordination  Rationale for Evaluation and Treatment: Rehabilitation  SUBJECTIVE:  SUBJECTIVE STATEMENT:  Patient reports that she stayed in bed all day on Sunday 2/2 not having caregivers available  PERTINENT HISTORY: Central Cord Syndrome at C6 of the Cervical Spinal Cord, Fall with Bilateral Closed Hip Fractures, with ORIF repair with Intramedullary Nailing of the Left Hip  Fracture.   PRECAUTIONS: None  WEIGHT BEARING RESTRICTIONS: No  PAIN:  Are you having pain? No pain   LIVING ENVIRONMENT: Lives with: [facility] Stairs: No Has following equipment at home: Wheelchair (power)  PLOF: Independent   PATIENT GOALS:  See below for established goals  OBJECTIVE:  Note: Objective measures were completed at Evaluation unless  otherwise noted.  HAND DOMINANCE: Right  ADLs: Caregiver assist with ADLs, and [facility]  MOBILITY STATUS: Uses a power w/c  ACTIVITY TOLERANCE: Activity tolerance: Fair  FUNCTIONAL OUTCOME MEASURES:  Measurements:   12/09/2021:   Shoulder flexion: R: 120(136), L: 138(150) Shoulder abduction: R: 108(120), L: 110(120) Elbow: R: 0-148, L: 0-146 Wrist flexion: R: 55(62), L: 78 Writ extension: R: 34(55), L: 3(4) RD: R: 12(20), L: 16(26) UD: R: 8(24), L: 6(18) Thumb radial abduction: R: 11(20), L: 8(20) Digit flexion to the [facility]: R:  2nd: 5cm(3.5cm), 3rd: 7.5 cm(5cm), 4th: 7cm(3cm), 5th: 6cm(5.5cm) L: 2nd: 8cm(8cm), 3rd: 8cm(8cm), 4th: 7.5cm(7cm), 5th: 7cm(7cm)   02/03/2022:   Shoulder flexion: R: 122(138), L: 148(154) Shoulder abduction: R: 109(125), L: 125(125 Elbow: R: 0-148, L: 0-146 Wrist flexion: R: 60(68), L: 79 Writ extension: R: 40(55), L: 3(10) RD: R: 14(24), L: 20(28) UD: R: 8(24), L: 6(18) Thumb radial abduction: R: 20(25), L: 8(20) Digit flexion to the [facility]: R:  2nd: 4.5cm(3cm), 3rd: 6.5cm(4.5cm), 4th: 6.5 cm(3cm), 5th: 4.5cm(5cm) L: 2nd: 8cm(8cm), 3rd: 8cm(7cm), 4th: 7.5cm(7cm), 5th: 6.5cm(6.5cm)   04/07/2022:   Shoulder flexion: R: 125(150), L: 160(160) Shoulder abduction: R: 109(125), L: 125(125 Elbow: R: 0-148, L: 0-146 Wrist flexion: R: 60(68), L: 79 Writ extension: R: 40(55), L: 5(18) RD: R: 14(24), L: 20(28) UD: R: 18(24), L: 8(18) Thumb radial abduction: R: 20(25), L: 8(20) Digit flexion to the [facility]: R:  2nd: 4.5 cm(4 cm), 3rd: 6.5cm(3cm), 4th: 7 cm (5cm), 5th: 5cm(5cm) L: 2nd: 8cm(8cm), 3rd: 8cm(7cm), 4th: 7.5cm(7cm), 5th: 7cm(7cm)   07/05/2022:   Shoulder flexion: R: 125(154), L: 160(160) Shoulder abduction: R: 95(130), L: 143(143) Elbow: R: 0-148, L: 0-146 Wrist flexion: R: 60(68), L: 79 Writ extension: R: 40(60), L: 0(12) RD: R: 10(24), L: 12(20) UD: R: 16(24), L: 8(16) Thumb radial abduction: R: 20(25), L: 8(20) Digit flexion to  the [facility]: R:  2nd: 4 cm(4cm), 3rd: 6.5cm( 3.5cm), 4th: 7 cm (5cm), 5th: 6cm(5cm) L: 2nd: 8cm(8cm), 3rd: 8cm(7cm), 4th: 8cm(7cm), 5th: 7cm(7cm)   09/13/2022:   Shoulder flexion: R: 125(154), L: 160(160) Shoulder abduction: R: 100(130), L: 143(143) Elbow: R: 0-150, L: 0-150 Wrist flexion: R: 55(68), L: 79 Writ extension: R: 45(60), L: 0(12) RD: R: 10(24), L: 12(20) UD: R: 16(24), L: 8(16) Thumb radial abduction: R: 20(25), L: 8(20) Digit flexion to the [facility]: R:  2nd: 4 cm(4cm), 3rd: 6cm( 5cm), 4th: 6 cm (4cm), 5th: 5cm(5cm) L: 2nd: 7cm(cm), 3rd: 7cm(6cm), 4th: 7cm(6cm), 5th: 6cm(6cm)   10/27/2022   Shoulder flexion: R: 128(154), L: 160(160) Shoulder abduction: R: 105(130), L: 143(143) Elbow: R: 0-150, L: 0-150 Wrist flexion: R: 55(68), L: 79 Writ extension: R: 46(64), L: 0(20) RD: R: 10(20), L: 18(24) UD: R: 18(28), L: 8(20) Thumb radial abduction: R: 30(42), L:20(24) Digit flexion to the [facility]: R:  2nd: 4 cm(3cm), 3rd: 6cm( 6cm), 4th: 6 cm (5cm), 5th: 5cm(5cm) L: 2nd: 8cm(8cm), 3rd: 8cm(6cm), 4th: 8cm(7cm), 5th: 6cm(6cm)  01/19/2023  Shoulder flexion: R: 131(154), L: 160(160) Shoulder abduction: R: 110(130), L: 143(143) Elbow: R: 0-150, L: 0-150 Wrist flexion: R: 55(68), L: 79 Writ extension: R: 46(64), L: 0(20) RD:  R: 10(20), L: 18(24) UD: R: 18(28), L: 14(20) Thumb radial abduction: R: 38(48), L:24(26) Thumb IP flexion: R: 35(50) L: 20(45) Digit flexion to the [facility]: R:  2nd: 3.5 cm(3cm), 3rd: 6cm( 6cm), 4th: 6 cm (5cm), 5th: 5cm(4.5cm) L: 2nd: 8cm(7.5cm), 3rd: 8cm(6cm), 4th: 7.5cm(6cm), 5th: 6cm(4.5cm)  04/04/2023  Shoulder flexion: R: 134(154), L: 160(160) Shoulder abduction: R: 114(130), L: 143(143) Elbow: R: WNL, L: WNL Wrist flexion: R: 55(68), L: 80 Writ extension: R: 46(64), L: -10(10) RD: R: 14(24), L: 20(24) UD: R: 20(30), L: 14(20) Thumb radial abduction: R: 38(48), L:28(30) Thumb IP flexion: R: 40(50) L: 25(45) Digit flexion to the [facility]: R:  2nd: 3.5  cm(3cm), 3rd: 6cm( 5cm), 4th: 6 cm (4cm), 5th: 5cm(5cm) L: 2nd: 8cm(7.5cm), 3rd: 8cm(7cm), 4th: 7.5cm(7cm), 5th: 7cm(6cm)  05/25/2023  Shoulder flexion: R: 134(154), L: 160(160) Shoulder abduction: R: 117(134), L: 143(143) Elbow: R: WNL, L: WNL Wrist flexion: R: 55(68), L: 80 Wrist extension: R: 46(64), L: -8(14) RD: R: 14(24), L: 20(24) UD: R: 20(32), L: 14(22) Thumb radial abduction: R: 38(50), L:28(30) Thumb IP flexion: R: 40(50) L: 30(45) Digit flexion to the [facility]: R:  2nd: 3.5 cm(3cm), 3rd: 6cm( 5cm), 4th: 7 cm (4cm), 5th: 5cm(4cm) L: 2nd: 8cm(7.5cm), 3rd: 8cm(7cm), 4th: 7.5cm(7cm), 5th: 7cm(6cm)  07/13/23  Shoulder flexion: R: 134(154), L: 160(160) Shoulder abduction: R: 122(134), L: 143(143) Elbow: R: WNL, L: WNL Wrist flexion: R: 55(68), L: 80 Wrist extension: R: 50(64), L: -4(18) RD: R: 18(24), L: 24(24) UD: R: 20(32), L: 14(22) Thumb radial abduction: R: 38(50), L:32(30) Thumb IP flexion: R: 42(50) L: 30(45) Digit flexion to the [facility]: R:  2nd: 3.5 cm(3cm), 3rd: 6cm( 5cm), 4th: 7 cm (4cm), 5th: 5cm(4cm) L: 2nd: 8cm(7.5cm), 3rd: 8cm(7cm), 4th: 7.5cm(7cm), 5th: 7cm(6cm)  11/02/23:   Shoulder flexion: R: 136(154), L: 160(160) Shoulder abduction: R: 123(134), L: 143(143) Elbow: R: WNL, L: WNL Wrist flexion: R: 55(68), L: 80 Wrist extension: R: 30(38), L: -16(18) RD: R: 8(18), L: 18(24) UD: R: 12(24), L: 8(22) Thumb radial abduction: R: 28(50), L:28(30) Thumb IP flexion: R: 42(55) L: 10(25) Digit flexion to the [facility]: R:  2nd: 4.5cm(4cm), 3rd: 6cm( 4.5cm), 4th: 6.5cm (5cm), 5th: 5cm(4.5cm) L: 2nd: 8cm(7.5cm), 3rd: 7.5cm(7cm), 4th: 7cm(7cm), 5th: 6cm(6cm)  02/06/24:  Shoulder flexion: R: 136(154), L: 160(160) Shoulder abduction: R: 123(134), L: 143(143) Elbow: R: WNL, L: WNL Wrist flexion: R: 55(68), L: 80 Wrist extension: R: 36(42), L: -10(22) RD: R: 8(18), L: 22(24) UD: R: 18(24), L: 10(22) Thumb radial abduction: R: 40 (50), L:30(32) Thumb IP flexion: R:  42(55) L: 15(25) Digit flexion to the [facility]: R:  2nd: 4.5cm(4cm), 3rd: 6cm( 4.5cm), 4th: 6.5cm (5cm), 5th: 5cm(4.5cm) L: 2nd: 7.5cm(7.5cm), 3rd: 7cm(6.5cm), 4th: 7cm(6.5cm), 5th: 5cm(5cm)       COORDINATION:   01/19/2023:  9 hole peg test:  Right: 2 pegs placed in 1 min. & 5 sec.  Pt. was able to remove 9 pegs in 17 sec.  Left: 2 pegs placed in 1 min. & 10 sec. Pt. was able to remove 9 pegs in 20 sec.  04/04/23:  Pt. was able to consistently grasp the pegs however each one slipped out of her fingers when attempting to place them into the Pegboard.  05/25/2023:   Right: 9 pegs placed and removed in 2 min. & 41 sec. Left: Unable to grasp the pegs for a horizontal position.  07/13/23    Right: 9 pegs placed and removed in 2 min. & 11 sec.     11/02/23:  Right 9 Hole Peg Test: 2 min. & 51 sec.   02/06/24:   Right 9 Hole Peg Test: 2 min. & 28 sec.with the board set-up on a laptop pillow    SENSATION: Intact  EDEMA: N/A   COGNITION: Overall cognitive status: WNL   VISION: Subjective report: Wears glasses, No changes in vision.  PERCEPTION: Intact  TODAY'S TREATMENT:                                                                                                                              DATE: 03/07/24    Manual Therapy:   -Pt. tolerated soft tissue massage to the right hand at the volar, dorsal, lateral, and medical aspects of the bilateral digits /2 stiffness. Manual therapy was performed independent of, and in preparation for ROM, and therapeutic ex.    Therapeutic Ex.:    -Pt. tolerated PROM with slow prolonged gentle stretching to the bilateral wrist extension with increased focus on left wrist extension. AAROM/PROM for bilateral digit MP, PIP, and DIP flexion, and extension, thumb abduction was performed in conjunction with moist heat modality 2/2 increased extensor tone, and tightness in the left wrist, and bilateral digits.  -Performed BUE  strengthening using a 2.5# dowel ex. 2/2 to weakness. Bilateral shoulder flexion, chest press, circular patterns, and elbow flexion/extension for 2 sets- 1st set 10 reps, 2nd set 5-10 reps each. -Pt. Perform lateral and 3pt. Pinch strengthening with yellow, and red resistive clips with the right hand, and yellow with the left hand.     PATIENT EDUCATION: Education details: Bilateral hand  ROM, condition management, self-care: Condition Management: BP Person educated: Patient Education method: Explanation, Demonstration, and Verbal cues Education comprehension: verbalized understanding and returned demonstration  HOME EXERCISE PROGRAM: Continue to assess HEP needs, and provide as needed.   GOALS: Goals reviewed with patient? Yes  LONG TERM GOALS: Target date: 04/25/2024  3.  Patient will independently use a reacher to pick up items from various surfaces.  Baseline: 11/02/23: Pt. is now able to consistently pick up small objects from the floor with a reacher. 07/13/2023: Pt. is starting to use a reacher to retrieve items with an adaptation/modification to the trigger. 05/25/2023:  Pt. is now able to initiate grasping a reacher to open and close the reacher with the right hand, however requires the grasp handle to be repositioned  within the hand. Pt. requires work on using/moving the reacher with the right hand through various planes. 04/04/2023: Pt. continues to work on improving Digit flexion to the [facility] to be able to efficiently handle Adaptive equipment. 01/19/2023: Pt. Is improving with Bilateral digit flexion to the [facility]. Pt. has difficulty firmly holding adaptive devices. 12/06/2022: Pt. continues to present with increased MP, PIP, and DIP digit tightness/stiffness limiting the formulation of bilateral composites fists. 10/27/2022: Pt. presents with increased MP, PIP, and DIP digit tightness/stiffness limiting the formulation of bilateral composites fists during this progress reporting period  limiting.  Digit flexion to the [facility]: R:  2nd: 4 cm(3cm), 3rd: 6cm( 6cm), 4th: 6 cm (5cm), 5th: 5cm(5cm), L: 2nd: 8cm(8cm), 3rd: 8cm(6cm), 4th: 8cm(7cm), 5th: 6cm(6cm) 09/20/2022: Improving digit flexion to the [facility]. 09/13/22: improved digit flexion to the [facility]. 07/05/2022: Pt. presents with digit MP, PIP, and DIP extensor tightness limiting her ability to securely grip objects in her bilateral hands.  04/07/2022: Pt. Has improved with right 2nd, and 3rd digit flexion towards the [facility]. Pt. Continues to have difficulty securely holding and applying deodorant.02/03/2022: Pt. has improved with digit flexion, however, continues to have difficulty securely holding and using deodorant. 12/09/2021: Bilateral hand/digit MP, PIP, and DIP extension tightness limits her ability achieve digit flexion to hold, and apply deodorant. 10/28/2021:  Pt. continues to have difficulty holding the deodorant. 01/12/2021: Pt. presents with limited digit extension. Pt. Is able to initiate holding deodorant, however is unable to hold it while using it  Goal status:  Achieved   4.  Pt. Will improve bilateral wrist extension in preparation for anticipating, and initiating reaching for objects at the table.  Baseline: 02/06/24: Wrist extension: R: 36(42), L: -10(22) 2/31/25: Pt. Continues to present with tightness  however presents with more tightness in the left wrist. 11/30/23: Continue 11/02/23: Wrist extension: R: 30(38), L: -16(18) 07/13/23: Wrist extension: R: 50(64), L: -4(18) 05/25/23: Wrist extension: R: 46(64), L: -8(14) 04/04/23: R: 46(64) L: -10(10)01/19/2023:  R: 46(64) L: 0(20) 12/06/2022: Pt continues to to progress with bilateral wrist extension in preparation fro functional reaching. 10/27/2022: Pt. Is improving with bilateral wrist extension. R: 46(64) L: 0(20) 09/20/2022: limited PROM in the Left wrist extension 2/2 tightness/stiffness. 09/13/22: R: 55(68), L: 0(12) 07/19/2022: Bilateral wrist extension in limited. 07/05/2022: Right: 40(60)  Left: 0(12) 04/07/2022: Right: 40(55) Left: 5(18) 02/03/2022: Right: 34(55) Left: 3(4) 12/09/2021: Right  34(55)  Left  3(4) 10/28/2021: Right 22(38), Left 0(15) 09/14/2021:  Right: 17(35), left 2(15)  Goal status: Ongoing  7.  Pt. Will increase bilateral lateral pinch strength by 2 lbs to be able to securely grasp items during ADLs, and IADL tasks.  Baseline: 01/19/2023: Deferred as the Pinch meter is out for calibration.12/06/2022: Pt. Is able to more securely hold objects during ADLs/IADLs 10/27/2022: Pt. Is improving with holding items with her bilateral thumbs. (Pinch meter out for calibration) 09/20/2022: lateral pinch continues to be limited 09/13/22: R 6.5# L 4# 07/19/2022: TBD 07/05/2022: TBD 07/2022: NT-Pinch meter out for calibration. 02/03/2022: Right: 6#, Left: 4# 12/09/2021: Right: 6#, Left: 4#  Goal status: Deferred   9.  Pt. will complete plant care with modified independence.  Baseline:12/06/2022: Pt. Is independent with plant care in her husband's room, however is not able to access her plants due to furniture blocking access to them. 10/27/2022: Pt. Is able to water plants that are closer, and within reach. Pt. continues to have difficulty reaching for thorough plant care. 09/20/2022: Pt. Continues to have difficulty reaching for thorough plant care. 09/13/22: continues to report intermittent difficulty 07/19/2022: Pt. continues to be able to water, and care for some of her plants. Pt. Has more difficulty with plants that are harder to reach. 07/05/2022: Pt. is able to water, and care for some of her plants. Pt. Has more difficulty with plants that are harder to reach. 04/07/2022: Pt. is now able to hold a cup and water her plants.02/03/2022: Pt. has difficulty caring for her plants.  Goal status:Achieved   10.  Pt. will demonstrate adaptive techniques to assist with the efficiency of self-dressing, or morning  care tasks.  Baseline: 07/13/23:Pt. continues to work on improving UE functioning to be able  to efficiently use Adaptive equipment 05/25/2023: Pt. continues to work on improving UE functioning to be able to efficiently use Adaptive equipment.  04/04/2023:  Pt. continues to work on improving UE functioning to be able to efficiently use Adaptive equipment. 01/19/2203: Pt. Is able to donn her jacket in reverse independently. Pt. requires assist from staff, as Pt. Reports staff have decreased time. 12/06/2022: Pt. requires assist from staff, as Pt. Reports staff have decreased time. 10/27/2022: Pt. is able to assist with initiating UE dressing. Pt. requires assist from personal, and staff care aides. 09/20/2022: Pt. continues to require assist form personal, and staff care aides. 09/13/22: MODA dressing, reports she is often rushed 07/19/2022: Pt. continues to require assist with self-dressing/morning care tasks. 07/05/2022: Continue with goal. 04/07/2022: Pt. Continues to require assist with the efficiency of self-dressing, and morning care tasks.02/03/2022: Pt. requires assist from caregivers 2/2 time limitations during morning care.  Goal status:  Deferred- Has caregiver assist and morning routine in place.  11.  Pt. will improve BUE strength to improve ADL, and IADL functioning.  Baseline:02/06/24: flexion L 4-/5, R 4-/5; shoulder abduction L 4-/5, R 4-/5. elbow flexion B 4/5. 02/01/24: Bilateral UE strength continues to be limited 11/30/2023: Continue 11/02/23: flexion L 4-/5, R 4-/5; shoulder abduction L 4-/5, R 4-/5. elbow flexion B 4/5,01/19/2023: L 4/5, R 3+/5  07/13/23: flexion L 4/5, R 4-/5; shoulder abduction L 4+/5, R 4-/5. elbow flexion B 5/5, elbow extension 4+/5 01/19/2023: L 4/5, R 3+/5  05/25/2023: shoulder flexion L 4/5, R 4-/5; shoulder abduction L 4+/5, R 4-/5. elbow flexion B 5/5, elbow extension 4+/5 01/19/2023: L 4/5, R 3+/5  04/04/2023: shoulder flexion L 4/5, R 3+/5; shoulder abduction L 4+/5, R 3+/5. elbow flexion B 5/5, elbow extension 4/5 01/19/2023: L 4/5, R 3+/5 12/06/2022: Continue  10/27/2022: shoulder flexion L 4/5, R 3+/5; shoulder abduction L 4+/5, R 3+/5. elbow flexion B 5/5, elbow extension 4/5 09/20/2022: Pt. Presents with limited BUE strength 09/13/22: shoulder flexion L 4/5, R 3/5; elbow flexion B 5/5, elbow extension 4-/5; shoulder abduction L 4+/5, R 3+/5. 07/19/2022: BUE strength continues to be limited. 07/05/2022: shoulder flexion: right 4-/5, abduction: 3+/5, elbow flexion: right: 5/5, left 5/5, extension: right: 4-/5, left 4-/5, wrist extension: right: 3-/5, left: 2-/5  Goal status: Ongoing  12.  Pt. will improve bilateral thumb radial abduction in order to be able to hold the grab bars while standing with PT  Baseline:02/06/24:Thumb radial abduction: R: 40 (50), L:30(32) Pt. is improving with thumb radial abduction during grasping, however has ankle cast in place therefore is unable to work on grasping parallel bars in standing.02/01/24: Pt. continues to improve with attempting more thumb abduction needed to reach, and grasp items.11/30/2023: Continue 11/02/23: Thumb radial abduction: R: 28(50), L:28(30) 07/13/23: Pt. Is improving with holding the grab bars with the right hand 05/25/23: Thumb radial abduction: R: 38(50), L:28(30) 04/04/23: R: 38(48), L:28(30) 01/19/2023: R: 38(48) L: 24(26)12/06/2022: Pt. Continues to present  with limited thumb abduction, however is improving holding onto the parallel bars.10/27/2022: thumb radial abduction: R: 30(42), L: 20(24  Goal status: Ongoing  13.  Pt. will be able to securely hold, and stabilize medication bottles at a tabletop surface when opening, and closing them.  Baseline: 01/19/2023: Pt. is now able to securely hold medication bottles while opening them, and stabilizes them on surfaces while opening them. 12/06/2022: Pt is improving with securely stabilizing medication  bottles. 10/27/2022: Pt. Stabilizes bottles against her torso when attempting to open, and close them   Goal status: Achieved  14: Pt. Will improve bilateral thumb  IP flexion to improve active grasping patterns.    Baseline: 02/06/23: Thumb IP flexion: R: 42(55) L: 15(25)  02/01/24: pt. Presents with tightness with bilateral thumb IP flexion.11/30/2023: Continue 11/02/23: Thumb IP flexion: R: 42(55) L: 10(25) 07/13/23: Thumb IP flexion: R: 42(50) L: 30(45) 05/25/2023: Thumb IP flexion: R: 40(50) L: 30(45) 04/04/2023:  R: 40(50) L: 25(45) 01/19/2023: Right 35(50), Left: 20/45)    Goal status: Ongoing    15. Pt. Will improve bilateral FMC/speed, and dexterity. As evidence by improved scores on the 9 hole peg test. Baseline: 02/06/24: Right 9 Hole Peg Test: 2 min. & 28 sec.with the board set-up on a laptop pillow 02/01/24: TBD 11/30/2023: Limited bilateral fine motor coordination;continue 11/02/23: 2 min. & 51 sec. 07/13/2023: Right: 9 pegs placed and removed in 2 min. & 11 sec. 05/25/2023: 9 Hole pegs test speed/dexterity: Right: 9 pegs placed and removed in 2 min. & 41 sec. Left: Unable to grasp the pegs for a horizontal position. 04/04/23: Pt. was able to consistently grasp the pegs however each one slipped out of her fingers when attempting to place them into the Pegboard. 01/19/2023: 2 pegs placed in 1 min. & 5 sec.  Pt. was able to remove 9 pegs in 17 sec.; Left: 2 pegs placed in 1 min. & 10 sec. Pt. was able to remove 9 pegs in 20 sec.   Goal status: Ongoing    16. Pt. Will donn/doff a jacket with modified independence   Baseline: 02/01/24: Independent-Pt. Wears her coat in reverse direction. 11/30/2023: Continue 11/02/23: MaxA   Goal status: Achieved.     ASSESSMENT:  CLINICAL IMPRESSION:  Upon arrival, Pt.'s BP 92/55 with HR 70 bpms. Pt. Continues tolerate STM, and slow gentle prolonged stretching well today without reports of pain. Pt. Reported fatigue with UE strengthening using the  2.5# dowel today. Pt. Required cues, and assist for hand  pinch position on the clips.  Pt. continues to present with increased extensor tightness in her wrist, and digits with  increased tightness in the left. Pt. continues to benefit from OT services to work on impoving ROM, and UE strength in order to work towards increasing bilateral hand grasp on objects, and increasing engagement of bilateral hands during ADLs, and IADL tasks    PERFORMANCE DE PERFORMANCE DEFICITS: in functional skills including ADLs, IADLs, coordination, dexterity, ROM, strength, and UE functional use, cognitive skills including , and psychosocial skills including coping strategies and environmental adaptation.   IMPAIRMENTS: are limiting patient from ADLs, IADLs, and leisure.   CO-MORBIDITIES: may have co-morbidities  that affects occupational performance. Patient will benefit from skilled OT to address above impairments and improve overall function.  MODIFICATION OR ASSISTANCE TO COMPLETE EVALUATION: Min-Moderate modification of tasks or assist with assess necessary to complete an evaluation.  OT OCCUPATIONAL PROFILE AND HISTORY: Detailed assessment: Review of records and additional review of physical, cognitive, psychosocial history related to current functional performance.  CLINICAL DECISION MAKING: Moderate - several treatment options, min-mod task modification necessary  REHAB POTENTIAL: Good for stated goals  PLAN:  OT FREQUENCY 2x's a week  OT DURATION: 12 weeks  PLANNED INTERVENTIONS ADL training, A/E training, UE ther. Ex, Manual therapy, neuromuscular re-education, moist heat modality, Paraffin Bath, Splinting, and  Pt./caregiver education   RECOMMENDED OTHER SERVICES: PT  CONSULTED AND AGREED WITH PLAN OF CARE:  Patient  PLAN FOR NEXT SESSION: Continue Treatment as per established POC  Richardson Otter, MS, OTR/L  03/07/24, 5:59 PM       "

## 2024-03-12 ENCOUNTER — Ambulatory Visit

## 2024-03-12 ENCOUNTER — Ambulatory Visit: Admitting: Occupational Therapy

## 2024-03-14 ENCOUNTER — Ambulatory Visit: Admitting: Occupational Therapy

## 2024-03-14 ENCOUNTER — Ambulatory Visit

## 2024-03-19 ENCOUNTER — Ambulatory Visit

## 2024-03-19 ENCOUNTER — Ambulatory Visit: Admitting: Occupational Therapy

## 2024-03-21 ENCOUNTER — Ambulatory Visit

## 2024-03-21 ENCOUNTER — Ambulatory Visit: Admitting: Occupational Therapy

## 2024-03-26 ENCOUNTER — Ambulatory Visit

## 2024-03-26 ENCOUNTER — Ambulatory Visit: Admitting: Occupational Therapy

## 2024-03-28 ENCOUNTER — Ambulatory Visit: Admitting: Occupational Therapy

## 2024-03-28 ENCOUNTER — Ambulatory Visit

## 2024-04-02 ENCOUNTER — Ambulatory Visit: Attending: Internal Medicine | Admitting: Occupational Therapy

## 2024-04-02 ENCOUNTER — Ambulatory Visit

## 2024-04-04 ENCOUNTER — Ambulatory Visit: Admitting: Occupational Therapy

## 2024-04-04 ENCOUNTER — Ambulatory Visit

## 2024-04-09 ENCOUNTER — Ambulatory Visit: Admitting: Occupational Therapy

## 2024-04-09 ENCOUNTER — Ambulatory Visit

## 2024-04-11 ENCOUNTER — Ambulatory Visit

## 2024-04-11 ENCOUNTER — Ambulatory Visit: Admitting: Occupational Therapy

## 2024-04-16 ENCOUNTER — Ambulatory Visit: Admitting: Occupational Therapy

## 2024-04-16 ENCOUNTER — Ambulatory Visit

## 2024-04-18 ENCOUNTER — Ambulatory Visit: Admitting: Occupational Therapy

## 2024-04-18 ENCOUNTER — Ambulatory Visit

## 2024-04-23 ENCOUNTER — Ambulatory Visit

## 2024-04-23 ENCOUNTER — Ambulatory Visit: Admitting: Occupational Therapy

## 2024-04-25 ENCOUNTER — Ambulatory Visit: Admitting: Occupational Therapy

## 2024-04-25 ENCOUNTER — Ambulatory Visit

## 2024-04-30 ENCOUNTER — Ambulatory Visit

## 2024-04-30 ENCOUNTER — Ambulatory Visit: Admitting: Occupational Therapy

## 2024-05-02 ENCOUNTER — Ambulatory Visit: Attending: Internal Medicine | Admitting: Occupational Therapy

## 2024-05-02 ENCOUNTER — Ambulatory Visit

## 2024-05-07 ENCOUNTER — Ambulatory Visit

## 2024-05-07 ENCOUNTER — Ambulatory Visit: Admitting: Occupational Therapy

## 2024-05-09 ENCOUNTER — Ambulatory Visit: Admitting: Occupational Therapy

## 2024-05-09 ENCOUNTER — Ambulatory Visit

## 2024-05-14 ENCOUNTER — Ambulatory Visit: Admitting: Occupational Therapy

## 2024-05-14 ENCOUNTER — Ambulatory Visit

## 2024-05-16 ENCOUNTER — Ambulatory Visit

## 2024-05-16 ENCOUNTER — Ambulatory Visit: Admitting: Occupational Therapy

## 2024-05-21 ENCOUNTER — Ambulatory Visit: Admitting: Occupational Therapy

## 2024-05-21 ENCOUNTER — Ambulatory Visit

## 2024-05-23 ENCOUNTER — Ambulatory Visit

## 2024-05-23 ENCOUNTER — Ambulatory Visit: Admitting: Occupational Therapy

## 2024-05-28 ENCOUNTER — Ambulatory Visit: Admitting: Occupational Therapy

## 2024-05-28 ENCOUNTER — Ambulatory Visit

## 2024-05-30 ENCOUNTER — Ambulatory Visit

## 2024-05-30 ENCOUNTER — Ambulatory Visit: Admitting: Occupational Therapy

## 2024-06-04 ENCOUNTER — Ambulatory Visit: Attending: Internal Medicine | Admitting: Occupational Therapy

## 2024-06-04 ENCOUNTER — Ambulatory Visit

## 2024-06-06 ENCOUNTER — Ambulatory Visit

## 2024-06-06 ENCOUNTER — Ambulatory Visit: Admitting: Occupational Therapy

## 2024-06-11 ENCOUNTER — Ambulatory Visit: Admitting: Occupational Therapy

## 2024-06-11 ENCOUNTER — Ambulatory Visit

## 2024-06-13 ENCOUNTER — Ambulatory Visit

## 2024-06-13 ENCOUNTER — Ambulatory Visit: Admitting: Occupational Therapy

## 2024-06-18 ENCOUNTER — Ambulatory Visit

## 2024-06-18 ENCOUNTER — Ambulatory Visit: Admitting: Occupational Therapy

## 2024-06-20 ENCOUNTER — Ambulatory Visit

## 2024-06-20 ENCOUNTER — Ambulatory Visit: Admitting: Occupational Therapy

## 2024-06-27 ENCOUNTER — Ambulatory Visit

## 2024-06-27 ENCOUNTER — Ambulatory Visit: Admitting: Occupational Therapy

## 2024-07-02 ENCOUNTER — Ambulatory Visit: Admitting: Occupational Therapy

## 2024-07-02 ENCOUNTER — Ambulatory Visit

## 2024-07-04 ENCOUNTER — Ambulatory Visit

## 2024-07-04 ENCOUNTER — Ambulatory Visit: Admitting: Occupational Therapy

## 2024-07-09 ENCOUNTER — Ambulatory Visit: Admitting: Occupational Therapy

## 2024-07-09 ENCOUNTER — Ambulatory Visit

## 2024-07-11 ENCOUNTER — Ambulatory Visit

## 2024-07-11 ENCOUNTER — Ambulatory Visit: Admitting: Occupational Therapy

## 2024-07-16 ENCOUNTER — Ambulatory Visit: Admitting: Occupational Therapy

## 2024-07-16 ENCOUNTER — Ambulatory Visit

## 2024-07-18 ENCOUNTER — Ambulatory Visit

## 2024-07-18 ENCOUNTER — Ambulatory Visit: Admitting: Occupational Therapy

## 2024-07-23 ENCOUNTER — Ambulatory Visit

## 2024-07-23 ENCOUNTER — Ambulatory Visit: Admitting: Occupational Therapy

## 2024-07-25 ENCOUNTER — Ambulatory Visit

## 2024-07-25 ENCOUNTER — Ambulatory Visit: Admitting: Occupational Therapy

## 2024-07-30 ENCOUNTER — Ambulatory Visit: Admitting: Occupational Therapy

## 2024-07-30 ENCOUNTER — Ambulatory Visit

## 2024-08-01 ENCOUNTER — Ambulatory Visit

## 2024-08-01 ENCOUNTER — Ambulatory Visit: Admitting: Occupational Therapy

## 2024-08-06 ENCOUNTER — Ambulatory Visit

## 2024-08-06 ENCOUNTER — Ambulatory Visit: Admitting: Occupational Therapy

## 2024-08-08 ENCOUNTER — Ambulatory Visit: Admitting: Occupational Therapy

## 2024-08-08 ENCOUNTER — Ambulatory Visit

## 2024-08-13 ENCOUNTER — Ambulatory Visit

## 2024-08-13 ENCOUNTER — Ambulatory Visit: Admitting: Occupational Therapy

## 2024-08-15 ENCOUNTER — Ambulatory Visit

## 2024-08-15 ENCOUNTER — Ambulatory Visit: Admitting: Occupational Therapy

## 2024-08-20 ENCOUNTER — Ambulatory Visit: Admitting: Occupational Therapy

## 2024-08-20 ENCOUNTER — Ambulatory Visit
